# Patient Record
Sex: Male | Born: 1980 | Race: Black or African American | Hispanic: No | Marital: Married
Health system: Southern US, Academic
[De-identification: ages and names within clinical notes are randomized; demographics above are authoritative.]

## PROBLEM LIST (undated history)

## (undated) ENCOUNTER — Ambulatory Visit: Payer: PRIVATE HEALTH INSURANCE | Attending: Dermatology | Primary: Dermatology

## (undated) ENCOUNTER — Encounter: Attending: Dermatology | Primary: Dermatology

## (undated) ENCOUNTER — Encounter

## (undated) ENCOUNTER — Telehealth: Attending: Dermatology | Primary: Dermatology

## (undated) ENCOUNTER — Telehealth
Attending: Student in an Organized Health Care Education/Training Program | Primary: Student in an Organized Health Care Education/Training Program

## (undated) ENCOUNTER — Telehealth

## (undated) ENCOUNTER — Ambulatory Visit

## (undated) ENCOUNTER — Ambulatory Visit: Payer: PRIVATE HEALTH INSURANCE

## (undated) ENCOUNTER — Encounter
Attending: Student in an Organized Health Care Education/Training Program | Primary: Student in an Organized Health Care Education/Training Program

## (undated) ENCOUNTER — Telehealth: Payer: PRIVATE HEALTH INSURANCE | Attending: Dermatology | Primary: Dermatology

## (undated) ENCOUNTER — Encounter: Attending: Physician Assistant | Primary: Physician Assistant

## (undated) ENCOUNTER — Telehealth: Attending: Internal Medicine | Primary: Internal Medicine

## (undated) ENCOUNTER — Ambulatory Visit
Payer: PRIVATE HEALTH INSURANCE | Attending: Student in an Organized Health Care Education/Training Program | Primary: Student in an Organized Health Care Education/Training Program

## (undated) ENCOUNTER — Ambulatory Visit: Payer: PRIVATE HEALTH INSURANCE | Attending: Physician Assistant | Primary: Physician Assistant

## (undated) ENCOUNTER — Ambulatory Visit: Attending: Primary Care | Primary: Primary Care

## (undated) ENCOUNTER — Telehealth: Payer: PRIVATE HEALTH INSURANCE

## (undated) DIAGNOSIS — R030 Elevated blood-pressure reading, without diagnosis of hypertension: Secondary | ICD-10-CM

## (undated) DIAGNOSIS — G4733 Obstructive sleep apnea (adult) (pediatric): Secondary | ICD-10-CM

## (undated) DIAGNOSIS — F32A Depression, unspecified: Secondary | ICD-10-CM

## (undated) DIAGNOSIS — K5190 Ulcerative colitis, unspecified, without complications: Secondary | ICD-10-CM

## (undated) DIAGNOSIS — J302 Other seasonal allergic rhinitis: Secondary | ICD-10-CM

## (undated) DIAGNOSIS — E785 Hyperlipidemia, unspecified: Secondary | ICD-10-CM

## (undated) DIAGNOSIS — F329 Major depressive disorder, single episode, unspecified: Secondary | ICD-10-CM

## (undated) DIAGNOSIS — IMO0001 Reserved for inherently not codable concepts without codable children: Secondary | ICD-10-CM

## (undated) DIAGNOSIS — K921 Melena: Secondary | ICD-10-CM

## (undated) HISTORY — DX: Obstructive sleep apnea (adult) (pediatric): G47.33

## (undated) HISTORY — DX: Melena: K92.1

## (undated) HISTORY — DX: Depression, unspecified: F32.A

## (undated) HISTORY — DX: Major depressive disorder, single episode, unspecified: F32.9

## (undated) HISTORY — DX: Reserved for inherently not codable concepts without codable children: IMO0001

## (undated) HISTORY — DX: Other seasonal allergic rhinitis: J30.2

## (undated) HISTORY — PX: WRIST SURGERY: SHX841

## (undated) HISTORY — DX: Hyperlipidemia, unspecified: E78.5

## (undated) HISTORY — DX: Elevated blood-pressure reading, without diagnosis of hypertension: R03.0

## (undated) HISTORY — DX: Ulcerative colitis, unspecified, without complications: K51.90

---
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
## 2005-05-20 HISTORY — PX: SEPTOPLASTY: SUR1290

## 2012-05-20 DIAGNOSIS — G4733 Obstructive sleep apnea (adult) (pediatric): Secondary | ICD-10-CM

## 2012-05-20 HISTORY — DX: Obstructive sleep apnea (adult) (pediatric): G47.33

## 2013-11-11 MED ORDER — VARENICLINE TARTRATE 0.5 MG PO TABS
0.5 MG | ORAL_TABLET | Freq: Two times a day (BID) | ORAL | Status: DC
Start: 2013-11-11 — End: 2014-08-31

## 2013-11-11 NOTE — Telephone Encounter (Signed)
Pt wife calling in for refill on chantix.

## 2013-11-18 MED ORDER — VENLAFAXINE HCL ER 150 MG PO CP24
150 MG | ORAL_CAPSULE | Freq: Every day | ORAL | Status: DC
Start: 2013-11-18 — End: 2014-08-31

## 2014-06-17 NOTE — Telephone Encounter (Signed)
Needs appt with PCP

## 2014-06-17 NOTE — Telephone Encounter (Signed)
busy

## 2014-06-20 NOTE — Telephone Encounter (Signed)
busy

## 2014-06-21 NOTE — Telephone Encounter (Signed)
Pt wife will have pt call in.

## 2014-07-01 NOTE — Telephone Encounter (Signed)
Pt has not been seen since 09/2013. Per previous TE, pt needs OV. First available apt time 08/31/14. Pt wife requesting r/f on rx "stating he can't be with out it". Please advise.

## 2014-07-01 NOTE — Telephone Encounter (Signed)
Per [facility] ok to fill until ov. Rx called in. Wife informed

## 2014-08-31 ENCOUNTER — Ambulatory Visit: Admit: 2014-08-31 | Discharge: 2014-08-31 | Payer: BLUE CROSS/BLUE SHIELD | Attending: Family Medicine

## 2014-08-31 DIAGNOSIS — L309 Dermatitis, unspecified: Secondary | ICD-10-CM

## 2014-08-31 MED ORDER — CLOTRIMAZOLE-BETAMETHASONE 1-0.05 % EX CREA
CUTANEOUS | Status: AC
Start: 2014-08-31 — End: ?

## 2014-08-31 MED ORDER — VENLAFAXINE HCL ER 150 MG PO CP24
150 MG | ORAL_CAPSULE | Freq: Every day | ORAL | Status: AC
Start: 2014-08-31 — End: ?

## 2014-08-31 MED ORDER — CLOBETASOL PROPIONATE 0.05 % EX OINT
0.05 % | CUTANEOUS | Status: AC
Start: 2014-08-31 — End: ?

## 2014-08-31 NOTE — Progress Notes (Signed)
Subjective:       Dakota Jordan is a 34 y.o. male who presents for follow up of depression. Current symptoms include better mood. Symptoms have been stable since that time. Patient denies anhedonia, depressed mood, difficulty concentrating, fatigue, feelings of worthlessness/guilt, hopelessness, hypersomnia, impaired memory, insomnia, psychomotor agitation, psychomotor retardation, recurrent thoughts of death, suicidal attempt, suicidal thoughts with specific plan, suicidal thoughts without plan, weight gain and weight loss. Previous treatment includes: medication. He complains of the following side effects from the treatment: none.    Past Medical History   Diagnosis Date   ??? Sleep apnea    ??? Depression      Patient Active Problem List    Diagnosis Date Noted   ??? Chronic ethmoidal sinusitis 11/26/2005     History reviewed. No pertinent past surgical history.  History reviewed. No pertinent family history.  History     Social History   ??? Marital Status: Married     Spouse Name: N/A     Number of Children: N/A   ??? Years of Education: N/A     Social History Main Topics   ??? Smoking status: Current Every Day Smoker -- 0.50 packs/day for 8 years   ??? Smokeless tobacco: Never Used   ??? Alcohol Use: No   ??? Drug Use: No   ??? Sexual Activity:     Partners: Female     Other Topics Concern   ??? None     Social History Narrative   ??? None     Current Outpatient Prescriptions   Medication Sig Dispense Refill   ??? venlafaxine (EFFEXOR XR) 150 MG XR capsule Take 1 capsule by mouth daily With food 30 capsule 3     No current facility-administered medications for this visit.     Current Outpatient Prescriptions on File Prior to Visit   Medication Sig Dispense Refill   ??? venlafaxine (EFFEXOR XR) 150 MG XR capsule Take 1 capsule by mouth daily With food 30 capsule 3     No current facility-administered medications on file prior to visit.     No Known Allergies    Review of Systems  A comprehensive review of systems was negative except for:  Integument/breast: positive for rash      Objective:      BP 129/72 mmHg   Pulse 74   Ht  (1.778 m)   Wt 315 lb 6.4 oz (143.065 kg)   BMI 45.26 kg/m2   General:  alert, appears stated age and cooperative   Affect & Behavior:  full facial expressions, good grooming, good insight, normal perception, normal reasoning, normal speech pattern and content and normal thought patterns good mood     Red rash all over his legs   ring like lesion on the stomach    Assessment:      Depression, stable      Rash    Plan:      Medications: Effexor.  Labs: no labs indicated at this time.  Recommended counseling.  Handouts describing disease, natural history, and treatment were given to the patient.  Reviewed concept of anxiety as biochemical imbalance of neurotransmitters and rationale for treatment.  Instructed patient to contact office or on-call physician promptly should condition worsen or any new symptoms appear and provided on-call telephone numbers. IF THE PATIENT HAS ANY SUICIDAL OR HOMICIDAL IDEATIONS, CALL THE OFFICE, DISCUSS WITH A SUPPORT MEMBER, OR GO TO THE ER IMMEDIATELY. Patient was agreeable with this plan.  Follow  up: 4 months.  Spent 30 minutes (>50% of visit) discussing the risks of anxiety disorder, bipolar disorder, obsessive compulsive disorder, panic attacks, post traumatic stress  disorder and sleep disturbance, the  pathophysiology, etiology, risks, and principles of treatment.    Referral to dermatologist

## 2014-08-31 NOTE — Progress Notes (Signed)
Tetanus shot 2-4 years ago.

## 2014-08-31 NOTE — Patient Instructions (Signed)
Depression Treatment: Care Instructions  Your Care Instructions  Depression is a condition that affects the way you feel, think, and act. It causes symptoms such as low energy, loss of interest in daily activities, and sadness or grouchiness that goes on for a long time. Depression is very common and affects men and women of all ages.  Depression is a medical illness caused by changes in the natural chemicals in your brain. It is not a character flaw, and it does not mean that you are a bad or weak person. It does not mean that you are going crazy.  It is important to know that depression can be treated. Medicines, counseling, and self-care can all help. Many people do not get help because they are embarrassed or think that they will get over the depression on their own. But some people do not get better without treatment.  Follow-up care is a key part of your treatment and safety. Be sure to make and go to all appointments, and call your doctor if you are having problems. It's also a good idea to know your test results and keep a list of the medicines you take.  How can you care for yourself at home?  Learn about antidepressant medicines  Antidepressant medicines can improve or end the symptoms of depression. You may need to take the medicine for at least 6 months, and often longer. Keep taking your medicine even if you feel better. If you stop taking it too soon, your symptoms may come back or get worse.  You may start to feel better within 1 to 3 weeks of taking antidepressant medicine. But it can take as many as 6 to 8 weeks to see more improvement. Talk to your doctor if you have problems with your medicine or if you do not notice any improvement after 3 weeks.  Antidepressants can make you feel tired, dizzy, or nervous. Some people have dry mouth, constipation, headaches, sexual problems, an upset stomach, or diarrhea. Many of these side effects are mild and go away on their own after you take the medicine  for a few weeks. Some may last longer. Talk to your doctor if side effects bother you too much. You might be able to try a different medicine. If you are pregnant or breast-feeding, talk to your doctor about what medicines you can take.  Learn about counseling  In many cases, counseling can work as well as medicines to treat mild to moderate depression. Counseling is done by licensed mental health providers, such as psychologists, social workers, and some types of nurses. It can be done in one-on-one sessions or in a group setting. Many people find group sessions helpful.  Cognitive-behavioral therapy is a type of counseling. In this treatment therapy, you learn how to see and change unhelpful thinking styles that may be adding to your depression. Counseling and medicines often work well when used together.  To manage depression  ?? Be physically active. Getting 30 minutes of exercise each day is good for your body and your mind. Begin slowly if it is hard for you to get started. If you already exercise, keep it up.  ?? Plan something pleasant for yourself every day. Include activities that you have enjoyed in the past.  ?? Get enough sleep. Talk to your doctor if you have problems sleeping.  ?? Eat a balanced diet. If you do not feel hungry, eat small snacks rather than large meals.  ?? Do not drink alcohol, use illegal   drugs, or take medicines that your doctor has not prescribed for you. They may interfere with your treatment.  ?? Spend time with family and friends. It may help to speak openly about your depression with people you trust.  ?? Take your medicines exactly as prescribed. Call your doctor if you think you are having a problem with your medicine.  ?? Do not make major life decisions while you are depressed. Depression may change the way you think. You will be able to make better decisions after you feel better.  ?? Think positively. Challenge negative thoughts with statements such as "I am hopeful"; "Things will  get better"; and "I can ask for the help I need." Write down these statements and read them often, even if you don't believe them yet.  ?? Be patient with yourself. It took time for your depression to develop, and it will take time for your symptoms to improve. Do not take on too much or be too hard on yourself.  ?? Learn all you can about depression from written and online materials.  ?? Check out behavioral health classes to learn more about dealing with depression.  ?? Keep the numbers for these national suicide hotlines: 1-800-273-TALK (1-800-273-8255) and 1-800-SUICIDE (1-800-784-2433). If you or someone you know talks about suicide or feeling hopeless, get help right away.  When should you call for help?  Call 911 anytime you think you may need emergency care. For example, call if:  ?? You feel you cannot stop from hurting yourself or someone else.  Call your doctor now or seek immediate medical care if:  ?? You hear voices.  ?? You feel much more depressed.  Watch closely for changes in your health, and be sure to contact your doctor if:  ?? You are having problems with your depression medicine.  ?? You are not getting better as expected.   Where can you learn more?   Go to [email or web address] and sign in to your MyChart account. Enter G693 in the Search Health Information box to learn more about ???Depression Treatment: Care Instructions.???    If you do not have an account, please click on the ???Sign Up Now??? link.     ?? 2006-2015 Healthwise, Inc[employer]. Care instructions adapted under license by [facility]. This care instruction is for use with your licensed healthcare professional. If you have questions about a medical condition or this instruction, always ask your healthcare professional. Healthwise, Incorporated disclaims any warranty or liability for your use of this information.  Content Version: 10.6.465758; Current as of: April 02, 2013

## 2014-12-07 ENCOUNTER — Encounter: Attending: Family Medicine

## 2015-01-25 NOTE — Other (Unsigned)
Patient Acct Nbr: [id number]   Primary AUTH/CERT:   Primary Insurance Company Name: [employer] [facility]  Primary Insurance Plan name: [employer] Blue Cross  Primary Insurance Group Number: [id number]  Primary Insurance Plan Type: Health  Primary Insurance Policy Number: [id number]    Secondary AUTH/CERT:   Secondary Insurance Company Name: [employer]  Secondary Insurance Plan name: [facility]  Secondary Insurance Group Number:   Secondary Insurance Plan Type: Health  Secondary Insurance Policy Number: [id number]

## 2015-04-17 ENCOUNTER — Ambulatory Visit (INDEPENDENT_AMBULATORY_CARE_PROVIDER_SITE_OTHER): Payer: BLUE CROSS/BLUE SHIELD | Admitting: Primary Care

## 2015-04-17 ENCOUNTER — Telehealth: Payer: Self-pay | Admitting: Primary Care

## 2015-04-17 ENCOUNTER — Encounter: Payer: Self-pay | Admitting: Primary Care

## 2015-04-17 VITALS — BP 134/86 | HR 89 | Temp 98.1°F | Ht 71.5 in | Wt 335.8 lb

## 2015-04-17 DIAGNOSIS — F329 Major depressive disorder, single episode, unspecified: Secondary | ICD-10-CM | POA: Diagnosis not present

## 2015-04-17 DIAGNOSIS — F32A Depression, unspecified: Secondary | ICD-10-CM | POA: Insufficient documentation

## 2015-04-17 DIAGNOSIS — E785 Hyperlipidemia, unspecified: Secondary | ICD-10-CM | POA: Insufficient documentation

## 2015-04-17 MED ORDER — VENLAFAXINE HCL ER 150 MG PO CP24: 150.0000 mg | ORAL_CAPSULE | Freq: Every day | ORAL | Status: DC

## 2015-04-17 NOTE — Patient Instructions (Signed)
Please schedule a physical with me in the next 3 months. You will also schedule a lab only appointment one week prior. We will discuss your lab results during your physical.  Refills have been sent to your pharmacy for the Effexor.  It is important that you improve your diet. Please limit carbohydrates in the form of white bread, rice, pasta, cakes, cookies, junk food, etc. Increase your consumption of fresh fruits and vegetables.  You need to consume about 2 liters of water daily.  Start exercising. You should be getting 1 hour of moderate intensity exercise 5 days weekly.  It was a pleasure to meet you today! Please don't hesitate to call me with any questions. Welcome to [facility]!

## 2015-04-17 NOTE — Assessment & Plan Note (Addendum)
Endorses history of in the past. Will review records. Will do lipid panel at upcoming physical. Discussed importance of weight loss through healthy diet and regular exercise.

## 2015-04-17 NOTE — Progress Notes (Signed)
Subjective:    Patient ID: [name], male    DOB: 01/26/1981, 34 y.o.   MRN: [id number]  HPI  [name] is a 34 year old male who presents today to establish care and discuss the problems mentioned below. Will obtain old records. His last physical was over one year ago. He lives in [place] and is working in [place]. He's working to find a job in [employer].   1) Depression: Diagnosed 1-1.5 years ago. Currently managed on Effexor XR 150 mg and feels well managed at this dose. Denies SI/HI, headaches, GI upset.  2) Elevated Blood Pressure Reading: Sporadic for a few years. He's currently not managed on medication. He's cut back on fried foods, daily, and fatty foods.  3) Hyperlipidemia: History of fatty liver in the past. He's cut back on fried foods and fatty foods. He's not currently exercising. He endorses a poor diet.  His diet currently consists of: Breakfast: Cereal with almond milk Lunch: Burrito, left overs Dinner: Steak, pork chops, pizza, pastas Snacks: Junk food. Beverages: Diet soda, water (1-2 bottles daily).   Exercise: He's currently not exercising  Review of Systems  Constitutional: Negative for unexpected weight change.  HENT: Negative for rhinorrhea.   Respiratory: Negative for cough and shortness of breath.   Cardiovascular: Negative for chest pain.  Gastrointestinal: Negative for diarrhea and constipation.  Genitourinary: Negative for difficulty urinating.  Musculoskeletal:       Left wrist pain/stiffness since surgery  Skin: Negative for rash.  Allergic/Immunologic: Positive for environmental allergies.  Neurological: Negative for dizziness and headaches.       Intermittent numbness/tingling to bilateral upper extremities.   Psychiatric/Behavioral:       See HPI       Past Medical History  Diagnosis Date  . Blood in stool   . Depression   . Allergy   . Hyperlipidemia   . Hypertension     Social History   Social History  . Marital Status:  Married    Spouse Name: N/A  . Number of Children: N/A  . Years of Education: N/A   Occupational History  . Not on file.   Social History Main Topics  . Smoking status: Former Games developer  . Smokeless tobacco: Former Neurosurgeon    Quit date: 03/17/2015     Comment: Quit in [place] smoker for 9 years  . Alcohol Use: 0.0 oz/week    0 Standard drinks or equivalent per week     Comment: soical  . Drug Use: Not on file  . Sexual Activity: Not on file   Other Topics Concern  . Not on file   Social History Narrative   Married.   7 kids.   Works as a Magazine features editor at [employer].   Enjoys target shooting.     Past Surgical History  Procedure Laterality Date  . Septoplasty  2007  . Wrist surgery      Family History  Problem Relation Age of Onset  . Alcohol abuse Paternal Aunt   . Alcohol abuse Paternal Uncle   . Stroke Paternal Uncle   . Hyperlipidemia Maternal Grandfather   . Hypertension Maternal Grandfather   . Diabetes Maternal Grandfather   . Alcohol abuse Maternal Grandfather   . Multiple sclerosis Mother   . Dementia Mother     Allergies  Allergen Reactions  . Lactose Intolerance (Gi)     No current outpatient prescriptions on file prior to visit.   No current facility-administered medications on file  prior to visit.    BP 134/86 mmHg  Pulse 89  Temp(Src) 98.1 F (36.7 C) (Oral)  Ht 5' 11.5" (1.816 m)  Wt 335 lb 12.8 oz (152.318 kg)  BMI 46.19 kg/m2  SpO2 98%    Objective:   Physical Exam  Constitutional: He is oriented to person, place, and time. He appears well-nourished.  Cardiovascular: Normal rate and regular rhythm.   Pulmonary/Chest: Effort normal and breath sounds normal.  Neurological: He is alert and oriented to person, place, and time.  Skin: Skin is warm and dry.  Psychiatric: He has a normal mood and affect.          Assessment & Plan:

## 2015-04-17 NOTE — Assessment & Plan Note (Signed)
Currently managed on Effexor XR 150 mg. Feels well managed at current dose. Refills provided today. Denies SI/HI, GI upset, headaches.

## 2015-04-17 NOTE — Telephone Encounter (Signed)
Patient called back. Asked who was his PCP. Patient stated that his PCP was Dr Sander Nephewalimahl and it is on one of the papwe work.

## 2015-04-17 NOTE — Progress Notes (Signed)
Pre visit review using our clinic review tool, if applicable. No additional management support is needed unless otherwise documented below in the visit note. 

## 2015-04-17 NOTE — Telephone Encounter (Signed)
Message left for patient to return my call.  

## 2015-04-17 NOTE — Telephone Encounter (Signed)
Will you please ask Mr. [name] for the name of his PCP in [place] so I may obtain records? He listed both of his orthopedic surgeons, but not his PCP. Thanks!

## 2016-04-29 ENCOUNTER — Other Ambulatory Visit: Payer: Self-pay | Admitting: Primary Care

## 2016-04-29 DIAGNOSIS — F32A Depression, unspecified: Secondary | ICD-10-CM

## 2016-04-29 DIAGNOSIS — F329 Major depressive disorder, single episode, unspecified: Secondary | ICD-10-CM

## 2016-06-14 ENCOUNTER — Other Ambulatory Visit: Payer: Self-pay | Admitting: Primary Care

## 2016-06-14 DIAGNOSIS — F329 Major depressive disorder, single episode, unspecified: Secondary | ICD-10-CM

## 2016-06-14 DIAGNOSIS — F32A Depression, unspecified: Secondary | ICD-10-CM

## 2016-06-17 NOTE — Telephone Encounter (Signed)
Ok to refill? Electronically refill request for   venlafaxine XR (EFFEXOR-XR) 150 MG 24 hr capsule  Last prescribed and seen on 04/17/2015.

## 2016-06-18 NOTE — Telephone Encounter (Signed)
Message left for patient to return my call.  

## 2016-07-08 ENCOUNTER — Encounter: Payer: Self-pay | Admitting: Primary Care

## 2016-07-08 ENCOUNTER — Encounter (INDEPENDENT_AMBULATORY_CARE_PROVIDER_SITE_OTHER): Payer: Self-pay

## 2016-07-08 ENCOUNTER — Ambulatory Visit (INDEPENDENT_AMBULATORY_CARE_PROVIDER_SITE_OTHER): Payer: 59 | Admitting: Primary Care

## 2016-07-08 VITALS — BP 120/76 | HR 74 | Temp 98.0°F | Ht 72.0 in | Wt 340.8 lb

## 2016-07-08 DIAGNOSIS — E785 Hyperlipidemia, unspecified: Secondary | ICD-10-CM | POA: Diagnosis not present

## 2016-07-08 DIAGNOSIS — F3342 Major depressive disorder, recurrent, in full remission: Secondary | ICD-10-CM | POA: Diagnosis not present

## 2016-07-08 LAB — COMPREHENSIVE METABOLIC PANEL
ALT: 29 U/L (ref 0–53)
AST: 21 U/L (ref 0–37)
Albumin: 4.3 g/dL (ref 3.5–5.2)
Alkaline Phosphatase: 116 U/L (ref 39–117)
BUN: 10 mg/dL (ref 6–23)
CO2: 30 mEq/L (ref 19–32)
Calcium: 9.8 mg/dL (ref 8.4–10.5)
Chloride: 102 mEq/L (ref 96–112)
Creatinine, Ser: 1 mg/dL (ref 0.40–1.50)
GFR: 89.14 mL/min (ref >60.00)
Glucose, Bld: 96 mg/dL (ref 70–99)
Potassium: 4.2 mEq/L (ref 3.5–5.1)
Sodium: 136 mEq/L (ref 135–145)
Total Bilirubin: 0.3 mg/dL (ref 0.2–1.2)
Total Protein: 7.4 g/dL (ref 6.0–8.3)

## 2016-07-08 LAB — LIPID PANEL
Cholesterol: 152 mg/dL (ref 0–200)
HDL: 41.6 mg/dL (ref >39.00)
LDL Cholesterol: 76 mg/dL (ref 0–99)
NonHDL: 110.28
Total CHOL/HDL Ratio: 4
Triglycerides: 169 mg/dL — ABNORMAL HIGH (ref 0.0–149.0)
VLDL: 33.8 mg/dL (ref 0.0–40.0)

## 2016-07-08 MED ORDER — VENLAFAXINE HCL ER 150 MG PO CP24: 150.0000 mg | ORAL_CAPSULE | Freq: Every day | ORAL | 3 refills | Status: DC

## 2016-07-08 NOTE — Assessment & Plan Note (Signed)
No recent lipid panel, will check today as he is fasting. Discussed the importance of a healthy diet and regular exercise in order for weight loss, and to reduce the risk of other medical diseases.

## 2016-07-08 NOTE — Patient Instructions (Signed)
I sent refills of your Effexor to the pharmacy.   Complete lab work prior to leaving today. I will notify you of your results once received.   Continue to work on cutting back fried foods, fatty foods, dairy.  It was a pleasure to see you today!

## 2016-07-08 NOTE — Assessment & Plan Note (Signed)
Well managed on Effexor, denies SI/HI. Refills provided today.

## 2016-07-08 NOTE — Progress Notes (Signed)
Pre visit review using our clinic review tool, if applicable. No additional management support is needed unless otherwise documented below in the visit note. 

## 2016-07-08 NOTE — Progress Notes (Signed)
   Subjective:    Patient ID: [name], male    DOB: 04/10/1981, 36 y.o.   MRN: [id number]  HPI  [name] is a 36 year old male who presents today for medication refill.  1) Major Depressive Disorder: Currently managed on Effexor XR 150 mg which he's taken several years ago. He is compliant to his Effexor XR daily. He denies nausea, upset stomach, headaches, SI/HI. He had a rough year in 2017 due to several family issues. Overall the Effexor helps and is needing a refill today.  2) Hyperlipidemia: Prior diagnosis, never managed on medication. He was also diagnosed with fatty liver. He's cut back on fried foods, dairy. He has had no recent lipid check and is fasting today.  Review of Systems  Respiratory: Negative for shortness of breath.   Cardiovascular: Negative for chest pain.  Neurological: Negative for headaches.  Psychiatric/Behavioral: Negative for sleep disturbance and suicidal ideas.       Feels well managed on treatment.       Past Medical History:  Diagnosis Date  . Blood in stool   . Depression   . Elevated blood pressure   . Hyperlipidemia   . Seasonal allergies      Social History   Social History  . Marital status: Married    Spouse name: N/A  . Number of children: N/A  . Years of education: N/A   Occupational History  . Not on file.   Social History Main Topics  . Smoking status: Former Games developermoker  . Smokeless tobacco: Former NeurosurgeonUser    Quit date: 03/17/2015     Comment[place] 9 years  . Alcohol use 0.0 oz/week     Comment: soical  . Drug use: Unknown  . Sexual activity: Not on file   Other Topics Concern  . Not on file   Social History Narrative   Married.   7 kids.   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   Enjoys target shooting.     Past Surgical History:  Procedure Laterality Date  . SEPTOPLASTY  2007  . WRIST SURGERY      Family History  Problem Relation Age of Onset  . Alcohol abuse Paternal Aunt   .  Alcohol abuse Paternal Uncle   . Stroke Paternal Uncle   . Hyperlipidemia Maternal Grandfather   . Hypertension Maternal Grandfather   . Diabetes Maternal Grandfather   . Alcohol abuse Maternal Grandfather   . Multiple sclerosis Mother   . Dementia Mother     Allergies  Allergen Reactions  . Lactose Intolerance (Gi)     Current Outpatient Prescriptions on File Prior to Visit  Medication Sig Dispense Refill  . ranitidine (ZANTAC) 150 MG tablet Take 150 mg by mouth 2 (two) times daily.     No current facility-administered medications on file prior to visit.     BP 120/76   Pulse 74   Temp 98 F (36.7 C) (Oral)   Ht 6' (1.829 m)   Wt (!) 340 lb 12.8 oz (154.6 kg)   SpO2 99%   BMI 46.22 kg/m    Objective:   Physical Exam  Constitutional: He appears well-nourished.  Cardiovascular: Normal rate and regular rhythm.   Pulmonary/Chest: Effort normal and breath sounds normal.  Skin: Skin is warm and dry.  Psychiatric: He has a normal mood and affect.          Assessment & Plan:

## 2016-07-10 ENCOUNTER — Encounter: Payer: Self-pay | Admitting: *Deleted

## 2016-08-21 ENCOUNTER — Other Ambulatory Visit: Payer: Self-pay | Admitting: Primary Care

## 2016-08-21 DIAGNOSIS — F3342 Major depressive disorder, recurrent, in full remission: Secondary | ICD-10-CM

## 2016-08-21 MED ORDER — VENLAFAXINE HCL ER 150 MG PO CP24: 150.0000 mg | ORAL_CAPSULE | Freq: Every day | ORAL | 3 refills | Status: DC

## 2016-08-21 NOTE — Telephone Encounter (Signed)
Received faxed refill request for venlafaxine XR (EFFEXOR-XR) 150 MG 24 hr capsule. Last prescribed on 07/08/2016.  Patient has change pharmacy to [facility] mail order.  Will send as requested.

## 2016-11-20 ENCOUNTER — Emergency Department (HOSPITAL_COMMUNITY)
Admission: EM | Admit: 2016-11-20 | Discharge: 2016-11-20 | Disposition: A | Discharge status: Home / Self Care | Payer: 59 | Attending: Emergency Medicine | Admitting: Emergency Medicine

## 2016-11-20 ENCOUNTER — Emergency Department (HOSPITAL_COMMUNITY): Payer: 59

## 2016-11-20 ENCOUNTER — Encounter (HOSPITAL_COMMUNITY): Payer: Self-pay | Admitting: Vascular Surgery

## 2016-11-20 DIAGNOSIS — Y939 Activity, unspecified: Secondary | ICD-10-CM | POA: Insufficient documentation

## 2016-11-20 DIAGNOSIS — Y33XXXA Other specified events, undetermined intent, initial encounter: Secondary | ICD-10-CM | POA: Diagnosis not present

## 2016-11-20 DIAGNOSIS — Y998 Other external cause status: Secondary | ICD-10-CM | POA: Diagnosis not present

## 2016-11-20 DIAGNOSIS — I10 Essential (primary) hypertension: Secondary | ICD-10-CM | POA: Insufficient documentation

## 2016-11-20 DIAGNOSIS — S81802A Unspecified open wound, left lower leg, initial encounter: Secondary | ICD-10-CM | POA: Insufficient documentation

## 2016-11-20 DIAGNOSIS — Z79899 Other long term (current) drug therapy: Secondary | ICD-10-CM | POA: Diagnosis not present

## 2016-11-20 DIAGNOSIS — Y929 Unspecified place or not applicable: Secondary | ICD-10-CM | POA: Diagnosis not present

## 2016-11-20 DIAGNOSIS — Z87891 Personal history of nicotine dependence: Secondary | ICD-10-CM | POA: Insufficient documentation

## 2016-11-20 DIAGNOSIS — M799 Soft tissue disorder, unspecified: Secondary | ICD-10-CM | POA: Diagnosis not present

## 2016-11-20 DIAGNOSIS — E785 Hyperlipidemia, unspecified: Secondary | ICD-10-CM | POA: Diagnosis not present

## 2016-11-20 LAB — BASIC METABOLIC PANEL
Anion gap: 7 (ref 5–15)
BUN: 13 mg/dL (ref 6–20)
CO2: 26 mmol/L (ref 22–32)
Calcium: 8.9 mg/dL (ref 8.9–10.3)
Chloride: 102 mmol/L (ref 101–111)
Creatinine, Ser: 0.99 mg/dL (ref 0.61–1.24)
GFR calc Af Amer: >60 mL/min (ref >60)
GFR calc non Af Amer: >60 mL/min (ref >60)
Glucose, Bld: 100 mg/dL — ABNORMAL HIGH (ref 65–99)
Potassium: 4.1 mmol/L (ref 3.5–5.1)
Sodium: 135 mmol/L (ref 135–145)

## 2016-11-20 LAB — CBC WITH DIFFERENTIAL/PLATELET
Basophils Absolute: 0 10*3/uL (ref 0.0–0.1)
Basophils Relative: 0 %
Eosinophils Absolute: 0.4 10*3/uL (ref 0.0–0.7)
Eosinophils Relative: 4 %
HCT: 43.3 % (ref 39.0–52.0)
Hemoglobin: 14.2 g/dL (ref 13.0–17.0)
Lymphocytes Relative: 27 %
Lymphs Abs: 2.3 10*3/uL (ref 0.7–4.0)
MCH: 29.3 pg (ref 26.0–34.0)
MCHC: 32.8 g/dL (ref 30.0–36.0)
MCV: 89.5 fL (ref 78.0–100.0)
Monocytes Absolute: 0.6 10*3/uL (ref 0.1–1.0)
Monocytes Relative: 7 %
Neutro Abs: 5.4 10*3/uL (ref 1.7–7.7)
Neutrophils Relative %: 62 %
Platelets: 233 10*3/uL (ref 150–400)
RBC: 4.84 MIL/uL (ref 4.22–5.81)
RDW: 12.9 % (ref 11.5–15.5)
WBC: 8.6 10*3/uL (ref 4.0–10.5)

## 2016-11-20 MED ORDER — CLINDAMYCIN PHOSPHATE 600 MG/50ML IV SOLN
600.0000 mg | Freq: Once | INTRAVENOUS | Status: AC
  Administered 2016-11-20: 600 mg via INTRAVENOUS
  Filled 2016-11-20: qty 50

## 2016-11-20 MED ORDER — CLINDAMYCIN HCL 150 MG PO CAPS: 300.0000 mg | ORAL_CAPSULE | Freq: Three times a day (TID) | ORAL | 0 refills | Status: DC

## 2016-11-20 NOTE — Discharge Instructions (Signed)
Take the prescribed medication as directed.  Keep wound clean with soap and warm water.  Keep wound covered if out and about, use the supplies we have given you. Follow-up with your primary care doctor for re-check. Return to the ED for new or worsening symptoms-- worsening redness, swelling, high fever, etc.

## 2016-11-20 NOTE — ED Notes (Signed)
ED Provider at bedside. 

## 2016-11-20 NOTE — ED Notes (Signed)
Patient transported to X-ray 

## 2016-11-20 NOTE — ED Triage Notes (Signed)
Pt denies any hx of DM.

## 2016-11-20 NOTE — ED Triage Notes (Signed)
Pt reports to the ED for eval of wound infection. 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 Dressing present over wound. He has been trying abx cream x 10-12 days which helped the erythema some but then it seemed to stop working and the erythema began to get worse.

## 2016-11-20 NOTE — ED Provider Notes (Signed)
Medical screening examination/treatment/procedure(s) were conducted as a shared visit with non-physician practitioner(s) and myself.  I personally evaluated the patient during the encounter. Briefly, the patient is a 36 yo obese male here with LLE ulceration with surrounding cellulitis following minor local trauma from scratching. Concern for acute cellulitis, with secondary ulceration likely 2/2 skin edema. Pt obese but denies h/o DM or poor wound healing. Exam is not c/w pyoderma though he does have h/o recurrent wounds particularly to LLE, so PCP attention to this would be recommended. Lab work shows no leukocytosis, no hyponatremia. He has no fever, is well-appearing, with no crepitance on exam, no free air on plain film, no hyponatremia - doubt nec fasc or myositis.  Considering he has no systemic sx of infection with no signs of sepsis, with no prior trial of PO abx, will trial PO abx, advise local wound care to ulcer, and PCP f/u in 2 days.   EKG Interpretation None          Duffy Bruce, MD 11/20/16 1921

## 2016-11-20 NOTE — ED Provider Notes (Signed)
[facility] DEPT Provider Note   CSN: [id number] Arrival date & time: 11/20/16  1523     History   Chief Complaint Chief Complaint  Patient presents with  . Wound Infection    HPI [name] is a 36 y.o. male.  The history is provided by the patient and medical records.    36 y.o. M with hx of depression, HTN, HLP, seasonal allergies, Presenting to the ED with wound of left leg. Reports he has a lactose intolerance and usually breaks out in a rash and he drinks milk or has certain dairy products. States about a month ago he developed a rash of his leg. They did initially got better with some antibiotic cream, but then started getting worse. He tried to switch to topical Neosporin to see if this would help. He admits he has been scratching the leg and his form somewhat of a "crater" in his left lower leg. He went to urgent care earlier today and was seen here for further evaluation.  He denies any fever or chills. No numbness or weakness of his left leg. He has no history of diabetes, HIV, or MRSA. No significant history of nonhealing wounds.  No drug use.  Past Medical History:  Diagnosis Date  . Blood in stool   . Depression   . Elevated blood pressure   . Hyperlipidemia   . Seasonal allergies     Patient Active Problem List   Diagnosis Date Noted  . Depression 04/17/2015  . Hyperlipidemia 04/17/2015    Past Surgical History:  Procedure Laterality Date  . SEPTOPLASTY  2007  . WRIST SURGERY         Home Medications    Prior to Admission medications   Medication Sig Start Date End Date Taking? Authorizing Provider  ranitidine (ZANTAC) 150 MG tablet Take 150 mg by mouth 2 (two) times daily.    [provider]  venlafaxine XR (EFFEXOR-XR) 150 MG 24 hr capsule Take 1 capsule (150 mg total) by mouth daily with breakfast. 08/21/16   [name], NP    Family History Family History  Problem Relation Age of Onset  . Alcohol abuse Paternal Aunt   .  Alcohol abuse Paternal Uncle   . Stroke Paternal Uncle   . Hyperlipidemia Maternal Grandfather   . Hypertension Maternal Grandfather   . Diabetes Maternal Grandfather   . Alcohol abuse Maternal Grandfather   . Multiple sclerosis Mother   . Dementia Mother     Social History Social History  Substance Use Topics  . Smoking status: Former Games developer  . Smokeless tobacco: Former Neurosurgeon    Quit date: 03/17/2015     Comment: Quit in [place] smoker for 9 years  . Alcohol use 0.0 oz/week     Comment: soical     Allergies   Lactose intolerance (gi)   Review of Systems Review of Systems  Skin: Positive for wound.  All other systems reviewed and are negative.    Physical Exam Updated Vital Signs BP (!) 148/81 (BP Location: Right Arm)   Pulse 86   Temp 97.9 F (36.6 C) (Oral)   Resp 16   Ht 5\' 11"  (1.803 m)   Wt (!) 152.9 kg (337 lb)   SpO2 98%   BMI 47.00 kg/m   Physical Exam  Constitutional: He is oriented to person, place, and time. He appears well-developed and well-nourished.  HENT:  Head: Normocephalic and atraumatic.  Mouth/Throat: Oropharynx is clear and moist.  Eyes: Conjunctivae  and EOM are normal. Pupils are equal, round, and reactive to light.  Neck: Normal range of motion.  Cardiovascular: Normal rate, regular rhythm and normal heart sounds.   Pulmonary/Chest: Effort normal and breath sounds normal. No respiratory distress. He has no wheezes.  Abdominal: Soft. Bowel sounds are normal. There is no tenderness. There is no rebound.  Musculoskeletal: Normal range of motion.  Wound of left lateral calf with a crater-like wound, approx 2cm in diameter; there is purulent drainage noted within wound; surrounding erythema, induration, and warmth to touch surrounding wound about 3 cm in all directions; no tissue crepitus; no fluctuance noted; DP pulse intact  Neurological: He is alert and oriented to person, place, and time.  Skin: Skin is warm and dry.    Psychiatric: He has a normal mood and affect.  Nursing note and vitals reviewed.       ED Treatments / Results  Labs (all labs ordered are listed, but only abnormal results are displayed) Labs Reviewed  BASIC METABOLIC PANEL - Abnormal; Notable for the following:       Result Value   Glucose, Bld 100 (*)    All other components within normal limits  AEROBIC CULTURE (SUPERFICIAL SPECIMEN)  CULTURE, BLOOD (ROUTINE X 2)  CULTURE, BLOOD (ROUTINE X 2)  CBC WITH DIFFERENTIAL/PLATELET    EKG  EKG Interpretation None       Radiology Dg Tibia/fibula Left  Result Date: 11/20/2016 CLINICAL DATA:  Six week history of soft tissue wound. EXAM: LEFT TIBIA AND FIBULA - 2 VIEW COMPARISON:  None. FINDINGS: No fracture. No worrisome lytic or sclerotic osseous abnormality. Soft tissue ulcer noted lateral aspect of the distal leg. IMPRESSION: No acute bony findings. Electronically Signed   By: Kennith Center M.D.   On: 11/20/2016 16:45    Procedures Procedures (including critical care time)  Medications Ordered in ED Medications - No data to display   Initial Impression / Assessment and Plan / ED Course  I have reviewed the triage vital signs and the nursing notes.  Pertinent labs & imaging results that were available during my care of the patient were reviewed by me and considered in my medical decision making (see chart for details).  36 year old male here with wound to left lower leg. Reports this began about a month ago when he developed a rash to milk allergy which is common for him. States he has been scratching the area somewhat. Seen at urgent care earlier today and sent here for further evaluation. He is afebrile and nontoxic. He does have a 2 cm crater-like wound to the left lateral calf with about 3 cm of surrounding erythema on all sides. There is some induration consistent with cellulitis. I do not appreciate any tissue crepitus. Some purulent drainage within wound but no  signs of abscess formation. Plain film negative for any gas or acute bony involvement. Labwork is reassuring, normal white blood cell count. Blood and wound cultures have been sent. Patient was given dose of IV clindamycin here. Given his stable vitals, normal labs, and overall well appearance, feel he is stable for trial of outpatient therapy as he has not yet completed course of oral antibiotics (only topical).  Patient agreeable to this. Wound was cleansed and dressed here with Xeroform wet-to-dry dressing. Discussed home wound care. Will need close follow-up with PCP for re-check.  Discussed plan with patient, he acknowledged understanding and agreed with plan of care.  Return precautions given for new or worsening symptoms.  Patient  seen and evaluated with attending physician, Dr. Erma HeritageIsaacs, who agrees with assessment and plan of care.  Final Clinical Impressions(s) / ED Diagnoses   Final diagnoses:  Wound of left leg, initial encounter    New Prescriptions Discharge Medication List as of 11/20/2016  7:51 PM    START taking these medications   Details  clindamycin (CLEOCIN) 150 MG capsule Take 2 capsules (300 mg total) by mouth 3 (three) times daily. May dispense as 150mg  capsules, Starting Wed 11/20/2016, Print         Garlon HatchetSanders, Catrina Fellenz M, PA-C 11/20/16 2025    Shaune PollackIsaacs, Cameron, MD 11/22/16 0730

## 2016-11-23 LAB — AEROBIC CULTURE W GRAM STAIN (SUPERFICIAL SPECIMEN)

## 2016-11-24 ENCOUNTER — Telehealth: Payer: Self-pay

## 2016-11-24 NOTE — Telephone Encounter (Signed)
Post ED Visit - Positive Culture Follow-up: Unsuccessful Patient Follow-up  Culture assessed and recommendations reviewed by:  []  Enzo BiNathan Batchelder, Pharm.D. []  Celedonio MiyamotoJeremy Frens, [address] BoulevardPharm.D., BCPS AQ-ID []  Garvin FilaMike Maccia, Pharm.D., BCPS []  Georgina PillionElizabeth Martin, Pharm.D., BCPS []  [place]vers[place]Minh Pham, [address] BoulevardPharm.D., BCPS, AAHIVP []  Estella HuskMichelle Turner, Pharm.D., BCPS, AAHIVP []  [name] Pearlachel Rumbarger, PharmD, BCPS []  Casilda Carlsaylor Stone, PharmD, BCPS []  Pollyann SamplesAndy Johnston, PharmD, BCPS [place] HunAllison Masters Pharm D Positive Aerobic culture  []  Patient discharged without antimicrobial prescription and treatment is now indicated [x]  Organism is resistant to prescribed ED discharge antimicrobial []  Patient with positive blood cultures   Unable to contact patient after 3 attempts, letter will be sent to address on file  Jerry CarasCullom, [name] 11/24/2016, 1:00 PM

## 2016-11-25 ENCOUNTER — Encounter: Payer: Self-pay | Admitting: Primary Care

## 2016-11-25 ENCOUNTER — Ambulatory Visit (INDEPENDENT_AMBULATORY_CARE_PROVIDER_SITE_OTHER): Payer: 59 | Admitting: Primary Care

## 2016-11-25 VITALS — BP 118/72 | HR 87 | Temp 98.7°F | Ht 72.0 in | Wt 339.8 lb

## 2016-11-25 DIAGNOSIS — L97222 Non-pressure chronic ulcer of left calf with fat layer exposed: Secondary | ICD-10-CM

## 2016-11-25 DIAGNOSIS — L88 Pyoderma gangrenosum: Secondary | ICD-10-CM | POA: Insufficient documentation

## 2016-11-25 DIAGNOSIS — J302 Other seasonal allergic rhinitis: Secondary | ICD-10-CM | POA: Insufficient documentation

## 2016-11-25 LAB — CULTURE, BLOOD (ROUTINE X 2)
Culture: NO GROWTH
Culture: NO GROWTH
Special Requests: ADEQUATE

## 2016-11-25 MED ORDER — CETIRIZINE HCL 10 MG PO TABS: 10.0000 mg | ORAL_TABLET | Freq: Every day | ORAL | 1 refills | Status: DC

## 2016-11-25 NOTE — Progress Notes (Signed)
Subjective:    Patient ID: [name], male    DOB: 06/06/1980, 36 y.o.   MRN: [id number]  HPI  [name] is a 36 year old male who presents today for emergency department follow up.  [facility] with a chief complaint of wound infection of his left lower extremity. He has a history of lactose intolerance and will experience breakouts with dairy consumption. The rash began 1 month prior, improved with antibiotic cream, then began getting worse. Prior to his visit to the ED he was evaluated at Urgent Care.  During his stay in the ED he was noted to have moderate erythema with a 2 cm crater-like wound to the left lateral calf. Xrays were negative for bony involvement. His wound was cleansed and dressed with Xeroform wet-to-dry dressing. He was prescribed clindamycin 300 mg TID and discharged home later that day. It was recommended he follow up with the wound center, he declined.  Since his emergency department visit he's noticed yellow drainage from the wound. He thinks the erythema has improved. His wound does hurt at times. He denies fevers. He's been compliant to his antibiotics and dressing changes. He's not scheduled a visit with the wound center as he'd like to use this as a "last resort".   Review of Systems  Constitutional: Negative for fever.  HENT: Positive for rhinorrhea.   Cardiovascular: Negative for palpitations.  Allergic/Immunologic: Positive for environmental allergies.  Neurological: Negative for weakness.       Past Medical History:  Diagnosis Date  . Blood in stool   . Depression   . Elevated blood pressure   . Hyperlipidemia   . Seasonal allergies      Social History   Social History  . Marital status: Married    Spouse name: N/A  . Number of children: N/A  . Years of education: N/A   Occupational History  . Not on file.   Social History Main Topics  . Smoking status: Former Games developermoker  . Smokeless tobacco: Former NeurosurgeonUser    Quit date:  03/17/2015     Comment: [place] 9 years  . Alcohol use 0.0 oz/week     Comment: soical  . Drug use: Unknown  . Sexual activity: Not on file   Other Topics Concern  . Not on file   Social History Narrative   Married.   7 kids.   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   Enjoys target shooting.     Past Surgical History:  Procedure Laterality Date  . SEPTOPLASTY  2007  . WRIST SURGERY      Family History  Problem Relation Age of Onset  . Alcohol abuse Paternal Aunt   . Alcohol abuse Paternal Uncle   . Stroke Paternal Uncle   . Hyperlipidemia Maternal Grandfather   . Hypertension Maternal Grandfather   . Diabetes Maternal Grandfather   . Alcohol abuse Maternal Grandfather   . Multiple sclerosis Mother   . Dementia Mother     Allergies  Allergen Reactions  . Biaxin [Clarithromycin] Other (See Comments)    Causes colitis flares  . Milk-Related Compounds Hives    Current Outpatient Prescriptions on File Prior to Visit  Medication Sig Dispense Refill  . acetaminophen (TYLENOL) 500 MG tablet Take 1,000 mg by mouth every 6 (six) hours as needed for headache (pain).    . clindamycin (CLEOCIN) 150 MG capsule Take 2 capsules (300 mg total) by mouth 3 (three)  times daily. May dispense as 150mg  capsules 60 capsule 0  . ibuprofen (ADVIL,MOTRIN) 200 MG tablet Take 400-600 mg by mouth every 6 (six) hours as needed for headache (pain).    . ranitidine (ZANTAC) 150 MG tablet Take 150 mg by mouth 2 (two) times daily as needed for heartburn (acid reflux).     . venlafaxine XR (EFFEXOR-XR) 150 MG 24 hr capsule Take 1 capsule (150 mg total) by mouth daily with breakfast. 90 capsule 3   No current facility-administered medications on file prior to visit.     BP 118/72   Pulse 87   Temp 98.7 F (37.1 C) (Oral)   Ht 6' (1.829 m)   Wt (!) 339 lb 12.8 oz (154.1 kg)   SpO2 96%   BMI 46.09 kg/m    Objective:   Physical Exam  Constitutional: He appears  well-nourished.  Neck: Neck supple.  Cardiovascular: Normal rate.   Pulmonary/Chest: Effort normal.  Skin: Skin is warm and dry. There is erythema.  1 cm x 0.75 cm crater with yellow/clear drainage. Erythema measuring 3 cm x 3 cm.           Assessment & Plan:

## 2016-11-25 NOTE — Patient Instructions (Signed)
Stop by the front desk and speak with either Shirlee LimerickMarion or Zella BallRobin regarding your referral to the wound clinic.  Please follow up with me in 1 week if you cannot get in with the wound center by then.  Continue Clindamycin antibiotics as prescribed.  It was a pleasure to see you today!

## 2016-11-25 NOTE — Assessment & Plan Note (Addendum)
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 Based off of pictures from his ED visit, overall the wound does appear to be slightly improved. Wound cultures with sensitivity to current antibiotics.   Will have him continue antibiotics. Referral placed to wound center for further evaluation. Follow up in our clinic or with wound clinic in 1 week, whichever is possible.  CMP in February 2018 without evidence of hyperglycemia.

## 2016-11-25 NOTE — Assessment & Plan Note (Signed)
Intermittent, worse recently. Rx for Zyrtec sent to pharmacy.

## 2016-12-04 ENCOUNTER — Encounter: Payer: 59 | Attending: Internal Medicine | Admitting: Internal Medicine

## 2016-12-04 DIAGNOSIS — I872 Venous insufficiency (chronic) (peripheral): Secondary | ICD-10-CM | POA: Diagnosis not present

## 2016-12-04 DIAGNOSIS — L97222 Non-pressure chronic ulcer of left calf with fat layer exposed: Secondary | ICD-10-CM | POA: Diagnosis not present

## 2016-12-04 DIAGNOSIS — I10 Essential (primary) hypertension: Secondary | ICD-10-CM | POA: Insufficient documentation

## 2016-12-04 DIAGNOSIS — L88 Pyoderma gangrenosum: Secondary | ICD-10-CM | POA: Diagnosis not present

## 2016-12-04 DIAGNOSIS — E872 Acidosis: Secondary | ICD-10-CM | POA: Diagnosis not present

## 2016-12-04 DIAGNOSIS — L97223 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sis of muscle: Secondary | ICD-10-CM | POA: Diagnosis not present

## 2016-12-04 DIAGNOSIS — F17210 Nicotine dependence, cigarettes, uncomplicated: Secondary | ICD-10-CM | POA: Insufficient documentation

## 2016-12-04 DIAGNOSIS — G4730 Sleep apnea, unspecified: Secondary | ICD-10-CM | POA: Insufficient documentation

## 2016-12-04 DIAGNOSIS — L988 Other specified disorders of the skin and subcutaneous tissue: Secondary | ICD-10-CM | POA: Diagnosis not present

## 2016-12-05 NOTE — Progress Notes (Signed)
[name], [name] ([id number]) Visit Report for 12/04/2016 Abuse/Suicide Risk Screen Details Patient Name: [name], [name] Date of Service: 12/04/2016 12:45 PM Medical Record Number: [id number] Patient Account Number: [id number] Date of Birth/Sex: [birth year] (35 y.o. Male) Treating RN: Baruch Gouty, RN, [name], [facility]va [facility]man Primary Care Vernica Wachtel: [name] Other Clinician: Referring Donielle Kaigler: [name] Treating Lular Letson/Extender: Ricard Dillon Weeks in Treatment: 0 Abuse/Suicide Risk Screen Items Answer ABUSE/SUICIDE RISK SCREEN: Has anyone close to you tried to hurt or harm you recentlyo No Do you feel uncomfortable with anyone in your familyo No Has anyone forced you do things that you didnot want to doo No Do you have any thoughts of harming yourselfo No Patient displays signs or symptoms of abuse and/or neglect. No Electronic Signature(s) Signed: 12/04/2016 6:10:04 PM By: Regan Lemming [name], RN Entered By: Regan Lemming on 12/04/2016 12:54:36 [name] ([id number]) -------------------------------------------------------------------------------- Activities of Daily Living Details Patient Name: [name], [name] Date of Service: 12/04/2016 12:45 PM Medical Record Number: [id number] Patient Account Number: [id number] Date of Birth/Sex: [birth year] (35 y.o. Male) Treating RN: Baruch Gouty, RN, [name], Velva Harman Primary Care Grayer Sproles: [name] Other Clinician: Referring Catalea Labrecque: [name] Treating Kyani Simkin/Extender: Ricard Dillon Weeks in Treatment: 0 Activities of Daily Living Items Answer Activities of Daily Living (Please select one for each item) Drive Automobile Completely Able Take Medications Completely Able Use Telephone Completely Able Care for Appearance Completely Able Use Toilet Completely Able Bath / Shower Completely Able Dress Self Completely Able Feed Self Completely Able Walk Completely Able Get In / Out Bed Completely Able Housework Completely Able Prepare Meals  Completely [place] for Self Completely Able Electronic Signature(s) Signed: 12/04/2016 6:10:04 PM By: Regan Lemming [name], RN Entered By: Regan Lemming on 12/04/2016 12:55:46 [name] ([id number]) -------------------------------------------------------------------------------- Education Assessment Details Patient Name: [name] Date of Service: 12/04/2016 12:45 PM Medical Record Number: [id number] Patient Account Number: [id number] Date of Birth/Sex: [birth year] (35 y.o. Male) Treating RN: Baruch Gouty, RN, [name], [place] Primary Care Daly Whipkey: [name] Other Clinician: Referring Arrabella Westerman: [name] Treating Naudia Crosley/Extender: Tito Dine in Treatment: 0 Primary Learner Assessed: Patient Learning Preferences/Education Level/Primary Language Highest Education Level: High School Preferred Language: English Cognitive Barrier Assessment/Beliefs Language Barrier: No Physical Barrier Assessment Impaired Vision: Yes Glasses Impaired Hearing: No Decreased Hand dexterity: No Knowledge/Comprehension Assessment Knowledge Level: High Comprehension Level: High Ability to understand written High instructions: Ability to understand verbal High instructions: Motivation Assessment Anxiety Level: Calm Cooperation: Cooperative Education Importance: Acknowledges Need Interest in Health Problems: Asks Questions Perception: Coherent Willingness to Engage in Self- High Management Activities: Readiness to Engage in Self- High Management Activities: Electronic Signature(s) Signed: 12/04/2016 6:10:04 PM By: Regan Lemming [name], RN Entered By: Regan Lemming on 12/04/2016 12:55:25 [name] ([id number]) -------------------------------------------------------------------------------- Fall Risk Assessment Details Patient Name: [name] Date of Service: 12/04/2016 12:45 PM Medical Record Number: [id number] Patient Account Number: [id number] Date  of Birth/Sex: Jan 04, 1981 (35 y.o. Male) Treating RN: Baruch Gouty, RN, [name], [place] Primary Care Trevaris Pennella: [name] Other Clinician: Referring Jamarie Mussa: [name] Treating Ryana Montecalvo/Extender: Tito Dine in Treatment: 0 Fall Risk Assessment Items Have you had 2 or more falls in the last 12 monthso 0 No Have you had any fall that resulted in injury in the last 12 monthso 0 No FALL RISK ASSESSMENT: History of falling - immediate or within 3 months 0 No Secondary diagnosis 0 No Ambulatory aid None/bed rest/wheelchair/nurse 0 Yes Crutches/cane/walker 0 No Furniture 0 No IV Access/Saline Lock 0 No Gait/Training Normal/bed rest/immobile 0 Yes Weak  0 No Impaired 0 No Mental Status Oriented to own ability 0 Yes Electronic Signature(s) Signed: 12/04/2016 6:10:04 PM By: Regan Lemming [name], RN Entered By: Regan Lemming on 12/04/2016 12:55:04 [name] ([id number]) -------------------------------------------------------------------------------- Foot Assessment Details Patient Name: [name] Date of Service: 12/04/2016 12:45 PM Medical Record Number: [id number] Patient Account Number: [id number] Date of Birth/Sex: September 03, 1980 (35 y.o. Male) Treating RN: Baruch Gouty, RN, [name], [place] Primary Care Evaristo Tsuda: [name] Other Clinician: Referring Caprice Mccaffrey: [name] Treating Eleonora Peeler/Extender: Ricard Dillon Weeks in Treatment: 0 Foot Assessment Items Site Locations + = Sensation present, - = Sensation absent, C = Callus, U = Ulcer R = Redness, W = Warmth, M = Maceration, PU = Pre-ulcerative lesion F = Fissure, S = Swelling, D = Dryness Assessment Right: Left: Other Deformity: No No Prior Foot Ulcer: No No Prior Amputation: No No Charcot Joint: No No Ambulatory Status: Ambulatory Without Help Gait: Steady Electronic Signature(s) Signed: 12/04/2016 6:10:04 PM By: Regan Lemming [name], RN Entered By: Regan Lemming on 12/04/2016 12:54:54 [name]  ([id number]) -------------------------------------------------------------------------------- Nutrition Risk Assessment Details Patient Name: [name] Date of Service: 12/04/2016 12:45 PM Medical Record Number: [id number] Patient Account Number: [id number] Date of Birth/Sex: [birth year] (35 y.o. Male) Treating RN: Baruch Gouty, RN, [name], [place] Primary Care Jasmon Graffam: [name] Other Clinician: Referring Alani Lacivita: [name] Treating Myriah Boggus/Extender: Ricard Dillon Weeks in Treatment: 0 Height (in): Weight (lbs): Body Mass Index (BMI): Nutrition Risk Assessment Items NUTRITION RISK SCREEN: I have an illness or condition that made me change the kind and/or 0 No amount of food I eat I eat fewer than two meals per day 0 No I eat few fruits and vegetables, or milk products 0 No I have three or more drinks of beer, liquor or wine almost every day 0 No I have tooth or mouth problems that make it hard for me to eat 0 No I don't always have enough money to buy the food I need 0 No I eat alone most of the time 0 No I take three or more different prescribed or over-the-counter drugs a 0 No day Without wanting to, I have lost or gained 10 pounds in the last six 0 No months I am not always physically able to shop, cook and/or feed myself 0 No Nutrition Protocols Good Risk Protocol 0 No interventions needed Moderate Risk Protocol Electronic Signature(s) Signed: 12/04/2016 6:10:04 PM By: Regan Lemming [name], RN Entered By: Regan Lemming on 12/04/2016 12:54:43

## 2016-12-05 NOTE — Progress Notes (Signed)
GERROD, MAULE ([id number]) Visit Report for 12/04/2016 Biopsy Details Patient Name: [name], [name] Date of Service: 12/04/2016 12:45 PM Medical Record Number: [id number] Patient Account Number: [id number] Date of Birth/Sex: 1980-06-27 (35 y.o. Male) Treating RN: Clover Mealy, RN, [name], [place] Primary Care Provider: [name] Other Clinician: Referring Provider: [name] Treating Provider/Extender: Maxwell Caul Weeks in Treatment: 0 Biopsy Performed for: Wound #1 Left, Lateral Lower Leg Location(s): Wound Margin, Peri-Ulcer Tissue Performed By: Physician Maxwell Caul, MD Tissue Punch: Yes Size (mm): 5 Number of Specimens Taken: 1 Specimen Sent To Pathology: Yes Pre-procedure Verification/Time-Out Yes - 13:30 Taken: Pain Control: Lidocaine Injectable Lidocaine Percent: 2% Instrument: Forceps, Other: punch Bleeding: Moderate Hemostasis Achieved: Silver Nitrate Procedural Pain: 0 Post Procedural Pain: 0 Response to Treatment: Procedure was tolerated well Post Procedure Diagnosis Same as Pre-procedure Electronic Signature(s) Signed: 12/04/2016 4:47:30 PM By: Baltazar Najjar MD Entered By: Baltazar Najjar on 12/04/2016 13:54:56 [place], [name] ([id number]) -------------------------------------------------------------------------------- Chief Complaint Document Details Patient Name: [name] Date of Service: 12/04/2016 12:45 PM Medical Record Number: [id number] Patient Account Number: [id number] Date of Birth/Sex: 07-28-1980 (35 y.o. Male) Treating RN: Clover Mealy, RN, [name], Rita Primary Care Provider: [name] Other Clinician: Referring Provider: [name] Treating Provider/Extender: Maxwell Caul Weeks in Treatment: 0 Information Obtained from: Patient Chief Complaint 12/04/16; patient is here for review of a wound on his posterior left calf that has been present for about a year Electronic Signature(s) Signed: 12/04/2016 4:47:30 PM By: Baltazar Najjar  MD Entered By: Baltazar Najjar on 12/04/2016 13:55:31 [name] ([id number]) -------------------------------------------------------------------------------- Debridement Details Patient Name: [name] Date of Service: 12/04/2016 12:45 PM Medical Record Number: [id number] Patient Account Number: [id number] Date of Birth/Sex: 07-09-1980 (35 y.o. Male) Treating RN: Clover Mealy, RN, [name], Rita Primary Care Provider: [name] Other Clinician: Referring Provider: [name] Treating Provider/Extender: Maxwell Caul Weeks in Treatment: 0 Debridement Performed for Wound #1 Left,Lateral Lower Leg Assessment: Performed By: Physician Maxwell Caul, MD Debridement: Debridement Severity of Tissue Pre Fat layer exposed Debridement: Pre-procedure Verification/Time Out Yes - 13:34 Taken: Start Time: 13:34 Pain Control: Lidocaine 4% Topical Solution Level: Skin/Subcutaneous Tissue Total Area Debrided (L x 2.5 (cm) x 2.5 (cm) = 6.25 (cm) W): Tissue and other Non-Viable, Exudate, Fat, Fibrin/Slough, Subcutaneous material debrided: Instrument: Curette Bleeding: Minimum Hemostasis Achieved: Pressure End Time: 13:36 Procedural Pain: 0 Post Procedural Pain: 0 Response to Treatment: Procedure was tolerated well Post Debridement Measurements of Total Wound Length: (cm) 2.5 Width: (cm) 2.5 Depth: (cm) 0.9 Volume: (cm) 4.418 Character of Wound/Ulcer Post Stable Debridement: Severity of Tissue Post Debridement: Fat layer exposed Post Procedure Diagnosis Same as Pre-procedure Electronic Signature(s) Signed: 12/04/2016 4:47:30 PM By: Baltazar Najjar MD Signed: 12/04/2016 6:10:04 PM By: Elpidio Eric [name], RN [place], [place] ([id number]) Entered By: Elpidio Eric on 12/04/2016 13:37:50 [name] ([id number]) -------------------------------------------------------------------------------- HPI Details Patient Name: [name] Date of Service: 12/04/2016 12:45  PM Medical Record Number: [id number] Patient Account Number: [id number] Date of Birth/Sex: 1980-06-04 (35 y.o. Male) Treating RN: Clover Mealy, RN, [name], Rita Primary Care Provider: [name] Other Clinician: Referring Provider: [name] Treating Provider/Extender: Maxwell Caul Weeks in Treatment: 0 History of Present Illness HPI Description: 12/04/16; 36 year old man who comes into the clinic today for review of a wound on the posterior left calf. He tells me that is been there for about a year. He is not a diabetic he does smoke half a pack per day. He was seen in the ER on 11/20/16 felt to have cellulitis around the  wound and was given clindamycin. An x-ray did not show osteomyelitis. The patient initially tells me that he has a milk allergy that sets off a pruritic itching rash on his lower legs which she scratches incessantly and he thinks that's what may have set up the wound. He has been using various topical antibiotics and ointments without any effect. He works in a [employer]ing [employer] and is on his feet all day. He does not have a prior history of wounds however he does have the rash on both lower legs the right arm and the ventral aspect of his left arm. These are excoriations and clearly have had scratching however there are of macular looking areas on both legs including a substantial larger area on the right leg. This does not have an underlying open area. There is no blistering. 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 He seemed to recognize this. 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 He is not in any treatment for this. He's had no recent travel Electronic Signature(s) Signed: 12/04/2016 4:47:30 PM By: Baltazar Najjar MD Entered By: Baltazar Najjar on  12/04/2016 13:59:45 [name] ([id number]) -------------------------------------------------------------------------------- Physical Exam Details Patient Name: [name] Date of Service: 12/04/2016 12:45 PM Medical Record Number: [id number] Patient Account Number: [id number] Date of Birth/Sex: Nov 08, 1980 (35 y.o. Male) Treating RN: Clover Mealy, RN, [name], Rita Primary Care Provider: [name] Other Clinician: Referring Provider: [name] Treating Provider/Extender: Maxwell Caul Weeks in Treatment: 0 Constitutional Sitting or standing Blood Pressure is within target range for patient.. Pulse regular and within target range for patient.Marland Kitchen Respirations regular, non-labored and within target range.. Temperature is normal and within the target range for the patient.Marland Kitchen appears in no distress. Eyes Conjunctivae clear. No discharge. Respiratory Respiratory effort is easy and symmetric bilaterally. Rate is normal at rest and on room air.. Bilateral breath sounds are clear and equal in all lobes with no wheezes, rales or rhonchi.. Cardiovascular Heart rhythm and rate regular, without murmur or gallop.. Femoral arteries without bruits and pulses strong.. Pedal pulses palpable and strong bilaterally.. Large legs without pitting edema. Wouldn't be impossible that he has some degree of chronic venous insufficiency with lymphedema. Gastrointestinal (GI) Obese no masses no tenderness. Lymphatic None palpable in the popliteal or inguinal area. Integumentary (Hair, Skin) The patient has several other excoriated areas on the right anterior leg which he says are from scratching. Also nondescript macular areas on both lower legs. On his right arm there is superficial excoriations which patient states are pruritic. Psychiatric No evidence of depression, anxiety, or agitation. Calm, cooperative, and communicative. Appropriate interactions and affect.. Notes Wound exam; there came to see  [place] about is a ragged circular wound on the left posterior lower leg. Necrotic surface. There is erythema around this that is nontender although this would look like cellulitis the absence of tenderness would make this unlikely. A punch biopsy was obtained from the superior outer aspect of this wound. Then using a #5 curet the necrotic surface of this was removed. Hemostasis with direct pressure. Electronic Signature(s) Signed: 12/04/2016 4:47:30 PM By: Baltazar Najjar MD Entered By: Baltazar Najjar on 12/04/2016 14:03:00 [name] ([id number]) -------------------------------------------------------------------------------- Physician Orders Details Patient Name: [name] Date of Service: 12/04/2016 12:45 PM Medical Record Number:  [id number] Patient Account Number: [id number] Date of Birth/Sex: May 13, 1981 (35 y.o. Male) Treating RN: Afful, RN, [name], Rita Primary Care Provider: [name] Other Clinician: Referring Provider: [name] Treating Provider/Extender: [place] in Treatment: 0 Verbal / Phone Orders: No Diagnosis Coding Wound Cleansing Wound #1 Left,Lateral Lower Leg o Cleanse wound with mild soap and water Anesthetic Wound #1 Left,Lateral Lower Leg o Topical Lidocaine 4% cream applied to wound bed prior to debridement Skin Barriers/Peri-Wound Care Wound #1 Left,Lateral Lower Leg o Barrier cream Primary Wound Dressing Wound #1 Left,Lateral Lower Leg o Aquacel Ag Secondary Dressing Wound #1 Left,Lateral Lower Leg o Dry Gauze o Boardered Foam Dressing Dressing Change Frequency Wound #1 Left,Lateral Lower Leg o Change dressing every other day. Follow-up Appointments Wound #1 Left,Lateral Lower Leg o Return Appointment in 1 week. Edema Control Wound #1 Left,Lateral Lower Leg o Patient to wear own compression stockings o Elevate legs to the level of the heart and pump ankles as often as possible Bari, Efstathios  ([id number]) Additional Orders / Instructions Wound #1 Left,Lateral Lower Leg o Increase protein intake. o Activity as tolerated Consults o Dermatology Laboratory o Tissue Pathology biopsy report (PATH) - left lateral leg oooo LOINC Code: [phone] oooo Convenience Name: [facility] Path Bx report Notes Will request DERM info from previous dermatology Electronic Signature(s) Signed: 12/04/2016 4:47:30 PM By: Baltazar Najjar MD Signed: 12/04/2016 6:10:04 PM By: Elpidio Eric [name], RN Entered By: Elpidio Eric on 12/04/2016 14:00:26 [name] ([id number]) -------------------------------------------------------------------------------- Problem List Details Patient Name: [name] Date of Service: 12/04/2016 12:45 PM Medical Record Number: [id number] Patient Account Number: [id number] Date of Birth/Sex: 05/11/1981 (35 y.o. 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 Male) Treating RN: Clover Mealy, RN, [name], Rita Primary Care Provider: [name] Other Clinician: Referring Provider: [name] Treating  Provider/Extender: Maxwell Caul Weeks in Treatment: 0 Subjective Chief Complaint Information obtained from Patient 12/04/16; patient is here for review of a wound on his posterior left calf that has been present for about a year History of Present Illness (HPI) 12/04/16; 36 year old man who comes into the clinic today for review of a wound on the posterior left calf. He tells me that is been there for about a year. He is not a diabetic he does smoke half a pack per day. He was seen in the ER on 11/20/16 felt to have cellulitis around the wound and was given clindamycin. An x-ray did not show osteomyelitis. The patient initially tells me that he has a milk allergy that sets off a pruritic itching rash on his lower legs which she scratches incessantly and he thinks that's what may have set up the wound. He has been using various topical antibiotics and ointments without any effect. He works in a [employer]ing [employer] and is on his feet all day. He does not have a prior history of wounds however he does have  the rash on both lower legs the right arm and the ventral aspect of his left arm. These are excoriations and clearly have had scratching however there are of macular looking areas on both legs including a substantial larger area on the right leg. This does not have an underlying open area. There is no blistering. 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 He seemed to recognize this. 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 He is not in any treatment for this. He's had no recent travel Wound History Patient presents with 1 open wound that has been present for approximately 1 year. Patient has been treating wound in the following manner: neosporin. Laboratory tests have not been  performed in the last month. Patient reportedly has not tested positive for an antibiotic resistant organism. Patient reportedly has not tested positive for osteomyelitis. Patient reportedly has not had testing performed to evaluate circulation in the legs. Patient experiences the following problems associated with their wounds: infection, swelling. Patient History Information obtained from Patient, Chart. Allergies mik, Biaxin, seasonal Family History [name], [name] ([id number]) Diabetes - Mother, Maternal Grandparents, Heart Disease - Mother, Maternal Grandparents, Hereditary Spherocytosis - Siblings, Hypertension - Maternal Grandparents, Stroke - Siblings, No family history of Cancer, Kidney Disease, Lung Disease, Seizures, Thyroid Problems, Tuberculosis. Social History Current some day smoker, Marital Status - Married, Alcohol Use - Rarely, Drug Use - No History, Caffeine Use - Moderate. Medical History Eyes Denies history of Cataracts, Glaucoma, Optic Neuritis Ear/Nose/Mouth/Throat Denies history of Chronic sinus problems/congestion, Middle ear problems Hematologic/Lymphatic Denies history of Anemia, Hemophilia, Human Immunodeficiency Virus, Lymphedema, Sickle Cell Disease Respiratory Patient has history of Sleep Apnea - cpap Denies history of Aspiration, Asthma, Chronic Obstructive Pulmonary Disease (COPD), Pneumothorax Cardiovascular Patient has history of Hypertension Denies history of Angina, Arrhythmia, Congestive Heart Failure, Coronary Artery Disease, Deep Vein Thrombosis, Hypotension, Myocardial Infarction, Peripheral Arterial Disease, Peripheral Venous Disease, Phlebitis, Vasculitis Gastrointestinal Patient has history of Colitis Endocrine Denies history of Type I Diabetes, Type II Diabetes Genitourinary Denies history of End Stage Renal Disease Immunological Denies history of Lupus Erythematosus, Raynaud s, Scleroderma Integumentary (Skin) Denies history of  History of Burn, History of pressure wounds Musculoskeletal Denies history of Gout, Rheumatoid Arthritis, Osteoarthritis, Osteomyelitis Neurologic Denies history of Dementia, Neuropathy, Quadriplegia, Paraplegia, Seizure Disorder Oncologic Denies history of Received Chemotherapy, Received Radiation Psychiatric Denies history of Anorexia/bulimia, Confinement Anxiety Review of Systems (ROS) Eyes Complains or has symptoms of Glasses / Contacts. Ear/Nose/Mouth/Throat The patient has no complaints or symptoms. Hematologic/Lymphatic The patient has no complaints or symptoms. Respiratory The patient has no complaints or symptoms. JASAUN, CARN ([id number]) Cardiovascular The patient has no complaints or symptoms. Gastrointestinal The patient has no complaints or symptoms. Endocrine The patient has no complaints or symptoms. Genitourinary The patient has no complaints or symptoms. Immunological The patient has no complaints or symptoms. Integumentary (Skin) Complains or has symptoms of Wounds, Breakdown, Swelling. Musculoskeletal The patient has no complaints or symptoms. Neurologic The patient has no complaints or symptoms. Oncologic The patient has no complaints or symptoms. Psychiatric The patient has no complaints or symptoms. Objective Constitutional Sitting or standing Blood Pressure is within target range for patient.. Pulse regular and within target range for patient.Marland Kitchen Respirations regular, non-labored and within target range.. Temperature  is normal and within the target range for the patient.Marland Kitchen. appears in no distress. Vitals Time Taken: 1:00 PM, Height: 71 in, Source: Stated, Weight: 338 lbs, Source: Measured, BMI: 47.1, Temperature: 98.2 F, Pulse: 70 bpm, Respiratory Rate: 17 breaths/min, Blood Pressure: 135/77 mmHg. Eyes Conjunctivae clear. No discharge. Respiratory Respiratory effort is easy and symmetric bilaterally. Rate is normal at rest and on room air..  Bilateral breath sounds are clear and equal in all lobes with no wheezes, rales or rhonchi.. Cardiovascular Heart rhythm and rate regular, without murmur or gallop.. Femoral arteries without bruits and pulses strong.. Pedal pulses palpable and strong bilaterally.. Large legs without pitting edema. Wouldn't be impossible that Keithley, Caulin ([id number]) he has some degree of chronic venous insufficiency with lymphedema. Gastrointestinal (GI) Obese no masses no tenderness. Lymphatic None palpable in the popliteal or inguinal area. Psychiatric No evidence of depression, anxiety, or agitation. Calm, cooperative, and communicative. Appropriate interactions and affect.. General Notes: Wound exam; there came to see us about is a ragged circular wound on the left posterior lower leg. Necrotic surface. There is erythema around this that is nontender although this would look like cellulitis the absence of tenderness would make this unlikely. A punch biopsy was obtained from the superior outer aspect of this wound. Then using a #5 curet the necrotic surface of this was removed. Hemostasis with direct pressure. Integumentary (Hair, Skin) The patient has several other excoriated areas on the right anterior leg which he says are from scratching. Also nondescript macular areas on both lower legs. On his right arm there is superficial excoriations which patient states are pruritic. Wound #1 status is Open. Original cause of wound was Gradually Appeared. The wound is located on the Left,Lateral Lower Leg. The wound measures 2.5cm length x 2.5cm width x 0.8cm depth; 4.909cm^2 area and 3.927cm^3 volume. There is Fat Layer (Subcutaneous Tissue) Exposed exposed. There is no tunneling or undermining noted. There is a medium amount of serosanguineous drainage noted. The wound margin is distinct with the outline attached to the wound base. There is no granulation within the wound bed. There is a large (67-100%)  amount of necrotic tissue within the wound bed including Adherent Slough. The periwound skin appearance exhibited: Induration, Hemosiderin Staining, Mottled. The periwound skin appearance did not exhibit: Callus, Crepitus, Excoriation, Rash, Scarring, Dry/Scaly, Maceration, Atrophie Blanche, Cyanosis, Ecchymosis, Pallor, Rubor, Erythema. Periwound temperature was noted as No Abnormality. The periwound has tenderness on palpation. 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 There was a Skin/Subcutaneous Tissue Debridement ([id number]) debridement with total area of 6.25 sq cm performed by Maxwell CaulOBSON, Jacquez Sheetz G, MD. with the following instrument(s): Curette to remove Non-Viable tissue/material including Exudate, Fat Layer (and Subcutaneous Tissue) Exposed, Fibrin/Slough, and Subcutaneous after achieving pain control using Lidocaine 4% Topical Solution. A time out was conducted at 13:34, prior to the start of the procedure. A Minimum amount of bleeding was controlled with Pressure. The procedure was tolerated well with a pain level of 0 throughout and a pain level of 0 following the procedure. Post Debridement Measurements: 2.5cm length x 2.5cm width x 0.9cm depth; 4.418cm^3 volume. Character of Wound/Ulcer Post Debridement is stable. Severity of Tissue Post Debridement is: Fat layer exposed. Post  procedure Diagnosis Wound #1: Same as Pre-Procedure Pre-procedure diagnosis of Wound #1 is a Venous Leg Ulcer located on the Left, Lateral Lower Leg . There was a biopsy performed by Maxwell Caul, MD. There was a biopsy performed on Wound Margin, Peri-Ulcer  Tissue. The skin was cleansed and prepped with anti-septic followed by pain control using Lidocaine Injectable: 2%. Utilizing a 5 mm tissue punch, tissue was removed at its base with the following instrument(s): Forceps and Other and sent to pathology. A Moderate amount of bleeding was controlled with Silver Nitrate. A time out was conducted at 13:30, prior to the start of the procedure. The procedure was tolerated well with a pain level of 0 throughout and a pain level of 0 following the procedure. Post procedure Diagnosis Wound #1: Same as Pre-Procedure Plan Wound Cleansing: Wound #1 Left,Lateral Lower Leg: Cleanse wound with mild soap and water Anesthetic: Wound #1 Left,Lateral Lower Leg: Topical Lidocaine 4% cream applied to wound bed prior to debridement Skin Barriers/Peri-Wound Care: Wound #1 Left,Lateral Lower Leg: Barrier cream Primary Wound Dressing: Wound #1 Left,Lateral Lower Leg: Aquacel Ag Secondary Dressing: Wound #1 Left,Lateral Lower Leg: Bhola, Kwamane ([id number]) Dry Gauze Boardered Foam Dressing Dressing Change Frequency: Wound #1 Left,Lateral Lower Leg: Change dressing every other day. Follow-up Appointments: Wound #1 Left,Lateral Lower Leg: Return Appointment in 1 week. Edema Control: Wound #1 Left,Lateral Lower Leg: Patient to wear own compression stockings Elevate legs to the level of the heart and pump ankles as often as possible Additional Orders / Instructions: Wound #1 Left,Lateral Lower Leg: Increase protein intake. Activity as tolerated Laboratory ordered were: [facility] Bx report - left lateral leg Consults ordered were: Dermatology General Notes: Will request DERM info from previous dermatology #1 irregular punched out circular area over the left posterior calf that is been present for a year. The cause of this at the bedside was not obvious to me therefore I went ahead and did a number for punch biopsy of this area. #2 after the biopsy the  area was the bride it with a #5 curet. There is a large amount of necrotic material on the surface of the wound #3 clearly a more widespread dermatologic issue here. The patient states that he may have been told in [place] that he has possible pyoderma gangrenosum although I may be putting words in the patient's mouth. He also has quite sent ulcerative colitis which is associated with this diagnosis. It is likely he will need to see dermatology. #4 so over alginate with border foam for now change every 2 days Electronic Signature(s) Signed: 12/04/2016 4:47:30 PM By: Baltazar Najjar MD Entered By: Baltazar Najjar on 12/04/2016 14:04:46 Mielke, [name] ([id number]) -------------------------------------------------------------------------------- ROS/PFSH Details Patient Name: [name] Date of Service: 12/04/2016 12:45 PM Medical Record Number: [id number] Patient Account Number: [id number] Date of Birth/Sex: Oct 28, 1980 (35 y.o. Male) Treating RN: Afful, RN, [name], Rita Primary Care Provider: [name] Other Clinician: Referring Provider: [name] Treating Provider/Extender: Maxwell Caul Weeks in Treatment: 0 Information Obtained From Patient Chart Wound History Do you currently have one or more open woundso Yes How many open wounds do you currently haveo 1 Approximately how long have you had your woundso 1 year How have you been treating your wound(s) until nowo neosporin Has your wound(s) ever healed and then re-openedo No Have you had any lab work done in the past montho No Have you tested positive for an antibiotic resistant organism (MRSA, VRE)o No Have you tested positive for osteomyelitis (bone  infection)o No Have you had any tests for circulation on your legso No Have you had other problems associated with your woundso Infection, Swelling Eyes Complaints and Symptoms: Positive for: Glasses / Contacts Medical History: Negative for: Cataracts; Glaucoma; Optic  Neuritis Integumentary (Skin) Complaints and Symptoms: Positive for: Wounds; Breakdown; Swelling Medical History: Negative for: History of Burn; History of pressure wounds Ear/Nose/Mouth/Throat Complaints and Symptoms: No Complaints or Symptoms Medical History: Negative for: Chronic sinus problems/congestion; Middle ear problems Hematologic/Lymphatic Complaints and Symptoms: No Complaints or Symptoms Sidney, Khani ([id number]) Medical History: Negative for: Anemia; Hemophilia; Human Immunodeficiency Virus; Lymphedema; Sickle Cell Disease Respiratory Complaints and Symptoms: No Complaints or Symptoms Medical History: Positive for: Sleep Apnea - cpap Negative for: Aspiration; Asthma; Chronic Obstructive Pulmonary Disease (COPD); Pneumothorax Cardiovascular Complaints and Symptoms: No Complaints or Symptoms Medical History: Positive for: Hypertension Negative for: Angina; Arrhythmia; Congestive Heart Failure; Coronary Artery Disease; Deep Vein Thrombosis; Hypotension; Myocardial Infarction; Peripheral Arterial Disease; Peripheral Venous Disease; Phlebitis; Vasculitis Gastrointestinal Complaints and Symptoms: No Complaints or Symptoms Medical History: Positive for: Colitis Endocrine Complaints and Symptoms: No Complaints or Symptoms Medical History: Negative for: Type I Diabetes; Type II Diabetes Genitourinary Complaints and Symptoms: No Complaints or Symptoms Medical History: Negative for: End Stage Renal Disease Immunological Complaints and Symptoms: No Complaints or Symptoms Hulet, Waylyn ([id number]) Medical History: Negative for: Lupus Erythematosus; Raynaudos; Scleroderma Musculoskeletal Complaints and Symptoms: No Complaints or Symptoms Medical History: Negative for: Gout; Rheumatoid Arthritis; Osteoarthritis; Osteomyelitis Neurologic Complaints and Symptoms: No Complaints or Symptoms Medical History: Negative for: Dementia; Neuropathy; Quadriplegia;  Paraplegia; Seizure Disorder Oncologic Complaints and Symptoms: No Complaints or Symptoms Medical History: Negative for: Received Chemotherapy; Received Radiation Psychiatric Complaints and Symptoms: No Complaints or Symptoms Medical History: Negative for: Anorexia/bulimia; Confinement Anxiety Immunizations Pneumococcal Vaccine: Received Pneumococcal Vaccination: No Family and Social History Cancer: No; Diabetes: Yes - Mother, Maternal Grandparents; Heart Disease: Yes - Mother, Maternal Grandparents; Hereditary Spherocytosis: Yes - Siblings; Hypertension: Yes - Maternal Grandparents; Kidney Disease: No; Lung Disease: No; Seizures: No; Stroke: Yes - Siblings; Thyroid Problems: No; Tuberculosis: No; Current some day smoker; Marital Status - Married; Alcohol Use: Rarely; Drug Use: No History; Caffeine Use: Moderate; Financial Concerns: No; Food, Clothing or Shelter Needs: No; Support System Lacking: No; Transportation Concerns: No; Advanced Directives: No; Living Will: No Electronic Signature(s) Signed: 12/04/2016 4:47:30 PM By: Baltazar Najjar MD Doral, [name] ([id number]) Signed: 12/04/2016 6:10:04 PM By: Elpidio Eric [name], RN Entered By: Elpidio Eric on 12/04/2016 13:00:25 [name] ([id number]) -------------------------------------------------------------------------------- SuperBill Details Patient Name: [name] Date of Service: 12/04/2016 Medical Record Number: [id number] Patient Account Number: [id number] Date of Birth/Sex: [birth year] (35 y.o. Male) Treating RN: Clover Mealy, RN, [name], Rita Primary Care Provider: [name] Other Clinician: Referring Provider: [name] Treating Provider/Extender: Maxwell Caul Weeks in Treatment: 0 Diagnosis Coding ICD-10 Codes Code Description [phone] 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sis of muscle I87.2 Venous insufficiency (chronic) (peripheral) L88 Pyoderma gangrenosum Facility Procedures CPT4 Code:  [id number] Description: 99214 - WOUND CARE VISIT-LEV 4 EST PT Modifier: Quantity: 1 CPT4 Code: [id number] Description: 11042 - DEB SUBQ TISSUE 20 SQ CM/< ICD-10 Description Diagnosis L97.223 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 Modifier: sis of muscle Quantity: 1 Physician Procedures CPT4 Code: 8416606 Description: 99204 - WC PHYS LEVEL 4 - NEW PT ICD-10 Description Diagnosis L97.223 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

## 2016-12-05 NOTE — Progress Notes (Signed)
[name], [name] ([id number]) Visit Report for 12/04/2016 Allergy List Details Patient Name: [name], [name] Date of Service: 12/04/2016 12:45 PM Medical Record Number: [id number] Patient Account Number: [id number] Date of Birth/Sex: 1980/09/20 (35 y.o. Male) Treating RN: Baruch Gouty, RN, [name], [place] Primary Care Tyeesha Riker: [name] Other Clinician: Referring Azani Brogdon: [name] Treating Tamari Busic/Extender: Ricard Dillon Weeks in Treatment: 0 Allergies Active Allergies mik Biaxin seasonal Allergy Notes Electronic Signature(s) Signed: 12/04/2016 6:10:04 PM By: Regan Lemming [name], RN Entered By: Regan Lemming on 12/04/2016 12:56:15 [name] ([id number]) -------------------------------------------------------------------------------- [place] Details Patient Name: [name] Date of Service: 12/04/2016 12:45 PM Medical Record Number: [id number] Patient Account Number: [id number] Date of Birth/Sex: Feb 11, 1981 (35 y.o. Male) Treating RN: Baruch Gouty, RN, [name], [place] Primary Care Kaniah Rizzolo: [name] Other Clinician: Referring Shellye Zandi: [name] Treating Lilliemae Fruge/Extender: Tito Dine in Treatment: 0 Visit Information Patient Arrived: Ambulatory Arrival Time: 12:53 Accompanied By: self Transfer Assistance: None Patient Identification Verified: Yes Secondary Verification Process Yes Completed: Patient Requires Transmission-Based No Precautions: Patient Has Alerts: No Electronic Signature(s) Signed: 12/04/2016 6:10:04 PM By: Regan Lemming [name], RN Entered By: Regan Lemming on 12/04/2016 12:54:20 [name] ([id number]) -------------------------------------------------------------------------------- Clinic Level of Care Assessment Details Patient Name: [name] Date of Service: 12/04/2016 12:45 PM Medical Record Number: [id number] Patient Account Number: [id number] Date of Birth/Sex: 10-13-1980 (35 y.o. Male) Treating RN: Baruch Gouty, RN, [name],  [place] Primary Care Marcelis Wissner: [name] Other Clinician: Referring Kaytlan Behrman: [name] Treating Karem Tomaso/Extender: Tito Dine in Treatment: 0 Clinic Level of Care Assessment Items TOOL 1 Quantity Score []  - Use when EandM and Procedure is performed on INITIAL visit 0 ASSESSMENTS - Nursing Assessment / Reassessment X - General Physical Exam (combine w/ comprehensive assessment (listed just 1 20 below) when performed on new pt. evals) X - Comprehensive Assessment (HX, ROS, Risk Assessments, Wounds Hx, etc.) 1 25 ASSESSMENTS - Wound and Skin Assessment / Reassessment X - Dermatologic / Skin Assessment (not related to wound area) 1 10 ASSESSMENTS - Ostomy and/or Continence Assessment and Care []  - Incontinence Assessment and Management 0 []  - Ostomy Care Assessment and Management (repouching, etc.) 0 PROCESS - Coordination of Care X - Simple Patient / Family Education for ongoing care 1 15 []  - Complex (extensive) Patient / Family Education for ongoing care 0 X - Staff obtains Programmer, systems, Records, Test Results / Process Orders 1 10 X - Staff telephones HHA, Nursing Homes / Clarify orders / etc 1 10 []  - Routine Transfer to another Facility (non-emergent condition) 0 []  - Routine Hospital Admission (non-emergent condition) 0 X - New Admissions / Biomedical engineer / Ordering NPWT, Apligraf, etc. 1 15 []  - Emergency Hospital Admission (emergent condition) 0 PROCESS - Special Needs []  - Pediatric / Minor Patient Management 0 []  - Isolation Patient Management 0 Flitton, Deandra ([id number]) []  - Hearing / Language / Visual special needs 0 []  - Assessment of Community assistance (transportation, D/C planning, etc.) 0 []  - Additional assistance / Altered mentation 0 []  - Support Surface(s) Assessment (bed, cushion, seat, etc.) 0 INTERVENTIONS - Miscellaneous []  - External ear exam 0 []  - Patient Transfer (multiple staff / Civil Service fast streamer / Similar devices) 0 []  -  Simple Staple / Suture removal (25 or less) 0 []  - Complex Staple / Suture removal (26 or more) 0 []  - Hypo/Hyperglycemic Management (do not check if billed separately) 0 X - Ankle / Brachial Index (ABI) - do not check if billed separately 1 15 Has the patient been seen at the  hospital within the last three years: Yes Total Score: 120 Level Of Care: New/Established - Level 4 Electronic Signature(s) Signed: 12/04/2016 6:10:04 PM By: Regan Lemming [name], RN Entered By: Regan Lemming on 12/04/2016 13:41:19 [name] ([id number]) -------------------------------------------------------------------------------- Encounter Discharge Information Details Patient Name: [name] Date of Service: 12/04/2016 12:45 PM Medical Record Number: [id number] Patient Account Number: [id number] Date of Birth/Sex: 1980-09-07 (35 y.o. Male) Treating RN: Baruch Gouty, RN, [name], [place] Primary Care Allison Silva: [name] Other Clinician: Referring Jasier Calabretta: [name] Treating Naryah Clenney/Extender: Tito Dine in Treatment: 0 Encounter Discharge Information Items Discharge Pain Level: 0 Discharge Condition: Stable Ambulatory Status: Ambulatory Discharge Destination: Home Transportation: Private Auto Accompanied By: self Schedule Follow-up Appointment: No Medication Reconciliation completed and provided to Patient/Care No Austine Kelsay: Provided on Clinical Summary of Care: 12/04/2016 Form Type Recipient Paper Patient RL Electronic Signature(s) Signed: 12/04/2016 1:56:33 PM By: Ruthine Dose Entered By: Ruthine Dose on 12/04/2016 13:56:33 Starkey, [name] ([id number]) -------------------------------------------------------------------------------- Lower Extremity Assessment Details Patient Name: [name] Date of Service: 12/04/2016 12:45 PM Medical Record Number: [id number] Patient Account Number: [id number] Date of Birth/Sex: 1981-04-10 (35 y.o. Male) Treating RN: Baruch Gouty, RN, [name], [place] Primary Care  Kashtyn Jankowski: [name] Other Clinician: Referring Siddhartha Hoback: [name] Treating Mishawn Didion/Extender: Ricard Dillon Weeks in Treatment: 0 Edema Assessment Assessed: [Left: No] [Right: No] E[Left: dema] [Right: :] Calf Left: Right: Point of Measurement: 33 cm From Medial Instep 52.5 cm cm Ankle Left: Right: Point of Measurement: 9 cm From Medial Instep 31 cm cm Vascular Assessment Claudication: Claudication Assessment [Left:None] Pulses: Dorsalis Pedis Palpable: [Left:Yes] Posterior Tibial Extremity colors, hair growth, and conditions: Extremity Color: [Left:Mottled] Hair Growth on Extremity: [Left:Yes] Temperature of Extremity: [Left:Warm] Capillary Refill: [Left:< 3 seconds] Blood Pressure: Brachial: [Left:137] Dorsalis Pedis: 158 [Left:Dorsalis Pedis:] Ankle: Posterior Tibial: [Left:Posterior Tibial: 1.15] Toe Nail Assessment Left: Right: Thick: No Discolored: No Deformed: No Improper Length and Hygiene: No MICO, SPARK ([id number]) Electronic Signature(s) Signed: 12/04/2016 6:10:04 PM By: Regan Lemming [name], RN Entered By: Regan Lemming on 12/04/2016 13:14:58 [name] ([id number]) -------------------------------------------------------------------------------- Multi Wound Chart Details Patient Name: [name] Date of Service: 12/04/2016 12:45 PM Medical Record Number: [id number] Patient Account Number: [id number] Date of Birth/Sex: [birth year] (35 y.o. Male) Treating RN: Baruch Gouty, RN, [name], [place] Primary Care Maxim Bedel: [name] Other Clinician: Referring Dawna Jakes: [name] Treating Hanh Kertesz/Extender: Ricard Dillon Weeks in Treatment: 0 Vital Signs Height(in): 71 Pulse(bpm): 70 Weight(lbs): 338 Blood Pressure 135/77 (mmHg): Body Mass Index(BMI): 47 Temperature(F): 98.2 Respiratory Rate 17 (breaths/min): Photos: [1:No Photos] [N/A:N/A] Wound Location: [1:Left Lower Leg - Lateral] [N/A:N/A] Wounding Event: [1:Gradually  Appeared] [N/A:N/A] Primary Etiology: [1:Venous Leg Ulcer] [N/A:N/A] Comorbid History: [1:Sleep Apnea, Hypertension, Colitis] [N/A:N/A] Date Acquired: [1:11/18/2015] [N/A:N/A] Weeks of Treatment: [1:0] [N/A:N/A] Wound Status: [1:Open] [N/A:N/A] Measurements L x W x D 2.5x2.5x0.8 [N/A:N/A] (cm) Area (cm) : [1:4.909] [N/A:N/A] Volume (cm) : [1:3.927] [N/A:N/A] Classification: [1:Full Thickness Without Exposed Support Structures] [N/A:N/A] Exudate Amount: [1:Medium] [N/A:N/A] Exudate Type: [1:Serosanguineous] [N/A:N/A] Exudate Color: [1:red, brown] [N/A:N/A] Wound Margin: [1:Distinct, outline attached] [N/A:N/A] Granulation Amount: [1:None Present (0%)] [N/A:N/A] Necrotic Amount: [1:Large (67-100%)] [N/A:N/A] Exposed Structures: [1:Fat Layer (Subcutaneous Tissue) Exposed: Yes Fascia: No Tendon: No Muscle: No Joint: No Bone: No] [N/A:N/A] Epithelialization: [1:None] [N/A:N/A] Debridement: Debridement (45809- N/A N/A 11047) Pre-procedure 13:34 N/A N/A Verification/Time Out Taken: Pain Control: Lidocaine 4% Topical N/A N/A Solution Tissue Debrided: Fibrin/Slough, Fat, N/A N/A Exudates, Subcutaneous Level: Skin/Subcutaneous N/A N/A Tissue Debridement Area (sq 6.25 N/A N/A cm): Instrument: Curette N/A N/A Bleeding: Minimum N/A N/A Hemostasis  Achieved: Pressure N/A N/A Procedural Pain: 0 N/A N/A Post Procedural Pain: 0 N/A N/A Debridement Treatment Procedure was tolerated N/A N/A Response: well Post Debridement 2.5x2.5x0.9 N/A N/A Measurements L x W x D (cm) Post Debridement 4.418 N/A N/A Volume: (cm) Periwound Skin Texture: Induration: Yes N/A N/A Excoriation: No Callus: No Crepitus: No Rash: No Scarring: No Periwound Skin Maceration: No N/A N/A Moisture: Dry/Scaly: No Periwound Skin Color: Hemosiderin Staining: Yes N/A N/A Mottled: Yes Atrophie Blanche: No Cyanosis: No Ecchymosis: No Erythema: No Pallor: No Rubor: No Temperature: No Abnormality N/A  N/A Tenderness on Yes N/A N/A Palpation: Wound Preparation: Ulcer Cleansing: N/A N/A Rinsed/Irrigated with Saline Topical Anesthetic STIGGER, Jibril ([id number]) Applied: Other: lidocaine 4% Procedures Performed: Biopsy N/A N/A Debridement Treatment Notes Wound #1 (Left, Lateral Lower Leg) 1. Cleansed with: Clean wound with Normal Saline 3. Peri-wound Care: Barrier cream Moisturizing lotion 4. Dressing Applied: Aquacel Ag 5. Secondary Dressing Applied Bordered Foam Dressing Dry Gauze 7. Secured with Recruitment consultant) Signed: 12/04/2016 4:47:30 PM By: Linton Ham MD Entered By: Linton Ham on 12/04/2016 13:50:05 [name] ([id number]) -------------------------------------------------------------------------------- Pain Assessment Details Patient Name: [name] Date of Service: 12/04/2016 12:45 PM Medical Record Number: [id number] Patient Account Number: [id number] Date of Birth/Sex: Oct 06, 1980 (35 y.o. Male) Treating RN: Baruch Gouty, RN, [name], [place] Primary Care Winfrey Chillemi: [name] Other Clinician: Referring Tej Murdaugh: [name] Treating Artemus Romanoff/Extender: Ricard Dillon Weeks in Treatment: 0 Active Problems Location of Pain Severity and Description of Pain Patient Has Paino No Site Locations With Dressing Change: No Pain Management and Medication Current Pain Management: Electronic Signature(s) Signed: 12/04/2016 6:10:04 PM By: Regan Lemming [name], RN Entered By: Regan Lemming on 12/04/2016 12:54:26 [name] ([id number]) -------------------------------------------------------------------------------- Patient/Caregiver Education Details Patient Name: [name] Date of Service: 12/04/2016 12:45 PM Medical Record Number: [id number] Patient Account Number: [id number] Date of Birth/Gender: 20-Jan-1981 (35 y.o. Male) Treating RN: Baruch Gouty, RN, [name], [place] Primary Care Physician: [name] Other Clinician: Referring Physician: [name] Treating Physician/Extender: Tito Dine in Treatment: 0 Education Assessment Education Provided To: Patient Education Topics Provided Wound Debridement: Methods: Explain/Verbal Responses: Reinforcements needed Wound/Skin Impairment: Methods: Explain/Verbal Responses: Reinforcements needed Electronic Signature(s) Signed: 12/04/2016 6:10:04 PM By: Regan Lemming [name], RN Entered By: Regan Lemming on 12/04/2016 13:42:40 [name] ([id number]) -------------------------------------------------------------------------------- Wound Assessment Details Patient Name: [name] Date of Service: 12/04/2016 12:45 PM Medical Record Number: [id number] Patient Account Number: [id number] Date of Birth/Sex: June 11, 1980 (35 y.o. Male) Treating RN: Baruch Gouty, RN, [name], [place] Primary Care Marc Sivertsen: [name] Other Clinician: Referring Taelyn Nemes: [name] Treating Savonna Birchmeier/Extender: Ricard Dillon Weeks in Treatment: 0 Wound Status Wound Number: 1 Primary Etiology: Venous Leg Ulcer Wound Location: Left Lower Leg - Lateral Wound Status: Open Wounding Event: Gradually Appeared Comorbid Sleep Apnea, Hypertension, History: Colitis Date Acquired: 11/18/2015 Weeks Of Treatment: 0 Clustered Wound: No Photos Photo Uploaded By: Regan Lemming on 12/04/2016 18:24:39 Wound Measurements Length: (cm) 2.5 Width: (cm) 2.5 Depth: (cm) 0.8 Area: (cm) 4.909 Volume: (cm) 3.927 % Reduction in Area: % Reduction in Volume: Epithelialization: None Tunneling: No Undermining: No Wound Description Full Thickness Without Exposed Classification: Support Structures Wound Margin: Distinct, outline attached Exudate Medium Amount: Exudate Type: Serosanguineous Exudate Color: red, brown Foul Odor After Cleansing: No Slough/Fibrino Yes Wound Bed Granulation Amount: None Present (0%) Exposed Structure Necrotic Amount: Large (67-100%) Fascia Exposed: No Necrotic Quality:  Adherent Slough Fat Layer (Subcutaneous Tissue) Exposed: Yes Rotondo, Rylyn ([id number]) Tendon Exposed: No Muscle Exposed: No Joint Exposed: No Bone Exposed: No  Periwound Skin Texture Texture Color No Abnormalities Noted: No No Abnormalities Noted: No Callus: No Atrophie Blanche: No Crepitus: No Cyanosis: No Excoriation: No Ecchymosis: No Induration: Yes Erythema: No Rash: No Hemosiderin Staining: Yes Scarring: No Mottled: Yes Pallor: No Moisture Rubor: No No Abnormalities Noted: No Dry / Scaly: No Temperature / Pain Maceration: No Temperature: No Abnormality Tenderness on Palpation: Yes Wound Preparation Ulcer Cleansing: Rinsed/Irrigated with Saline Topical Anesthetic Applied: Other: lidocaine 4%, Treatment Notes Wound #1 (Left, Lateral Lower Leg) 1. Cleansed with: Clean wound with Normal Saline 3. Peri-wound Care: Barrier cream Moisturizing lotion 4. Dressing Applied: Aquacel Ag 5. Secondary Dressing Applied Bordered Foam Dressing Dry Gauze 7. Secured with Recruitment consultant) Signed: 12/04/2016 6:10:04 PM By: Regan Lemming [name], RN Entered By: Regan Lemming on 12/04/2016 13:09:02 [name] ([id number]) -------------------------------------------------------------------------------- Vitals Details Patient Name: [name] Date of Service: 12/04/2016 12:45 PM Medical Record Number: [id number] Patient Account Number: [id number] Date of Birth/Sex: Dec 07, 1980 (35 y.o. Male) Treating RN: Baruch Gouty, RN, [name], [place] Primary Care Britaney Espaillat: [name] Other Clinician: Referring Radhika Dershem: [name] Treating Jarian Longoria/Extender: Tito Dine in Treatment: 0 Vital Signs Time Taken: 13:00 Temperature (F): 98.2 Height (in): 71 Pulse (bpm): 70 Source: Stated Respiratory Rate (breaths/min): 17 Weight (lbs): 338 Blood Pressure (mmHg): 135/77 Source: Measured Reference Range: 80 - 120 mg / dl Body Mass Index (BMI): 47.1 Electronic  Signature(s) Signed: 12/04/2016 6:10:04 PM By: Regan Lemming [name], RN Entered By: Regan Lemming on 12/04/2016 13:03:19

## 2016-12-10 LAB — SURGICAL PATHOLOGY

## 2016-12-11 ENCOUNTER — Encounter: Payer: 59 | Admitting: Internal Medicine

## 2016-12-11 DIAGNOSIS — L97222 Non-pressure chronic ulcer of left calf with fat layer exposed: Secondary | ICD-10-CM | POA: Diagnosis not present

## 2016-12-11 DIAGNOSIS — I872 Venous insufficiency (chronic) (peripheral): Secondary | ICD-10-CM | POA: Diagnosis not present

## 2016-12-11 DIAGNOSIS — L97223 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sis of muscle: Secondary | ICD-10-CM | POA: Diagnosis not present

## 2016-12-13 NOTE — Progress Notes (Signed)
[name], [name] ([id number]) Visit Report for 12/11/2016 Arrival Information Details Patient Name: [name], [name] Date of Service: 12/11/2016 8:15 AM Medical Record Number: [id number] Patient Account Number: [id number] Date of Birth/Sex: 09-22-1980 (35 y.o. Male) Treating RN: Baruch Gouty, RN, [name], [place] Primary Care Lasaro Primm: [name] Other Clinician: Referring Raoul Ciano: [name] Treating Chaelyn Bunyan/Extender: Tito Dine in Treatment: 1 Visit Information History Since Last Visit All ordered tests and consults were completed: No Patient Arrived: Ambulatory Added or deleted any medications: No Arrival Time: 08:20 Any new allergies or adverse reactions: No Accompanied By: self Had a fall or experienced change in No Transfer Assistance: None activities of daily living that may affect Patient Identification Verified: Yes risk of falls: Secondary Verification Process Yes Signs or symptoms of abuse/neglect since last No Completed: visito Patient Requires Transmission-Based No Has Dressing in Place as Prescribed: Yes Precautions: Pain Present Now: No Patient Has Alerts: No Electronic Signature(s) Signed: 12/11/2016 2:24:03 PM By: Regan Lemming [name], RN Entered By: Regan Lemming on 12/11/2016 08:20:53 [name] ([id number]) -------------------------------------------------------------------------------- Encounter Discharge Information Details Patient Name: [name] Date of Service: 12/11/2016 8:15 AM Medical Record Number: [id number] Patient Account Number: [id number] Date of Birth/Sex: Apr 20, 1981 (35 y.o. Male) Treating RN: Baruch Gouty, RN, [name], [place] Primary Care Esty Ahuja: [name] Other Clinician: Referring Kalep Full: [name] Treating Calah Gershman/Extender: Tito Dine in Treatment: 1 Encounter Discharge Information Items Discharge Pain Level: 0 Discharge Condition: Stable Ambulatory Status: Ambulatory Discharge Destination:  Home Transportation: Private Auto Accompanied By: self Schedule Follow-up Appointment: No Medication Reconciliation completed and provided to Patient/Care No Myrtha Tonkovich: Provided on Clinical Summary of Care: 12/11/2016 Form Type Recipient Paper Patient RL Electronic Signature(s) Signed: 12/11/2016 9:09:38 AM By: Ruthine Dose Entered By: Ruthine Dose on 12/11/2016 09:09:38 [name] ([id number]) -------------------------------------------------------------------------------- Lower Extremity Assessment Details Patient Name: [name] Date of Service: 12/11/2016 8:15 AM Medical Record Number: [id number] Patient Account Number: [id number] Date of Birth/Sex: 1981/03/20 (35 y.o. Male) Treating RN: Baruch Gouty, RN, [name], [place] Primary Care Sony Schlarb: [name] Other Clinician: Referring Tamsyn Owusu: [name] Treating Ophelia Sipe/Extender: Ricard Dillon Weeks in Treatment: 1 Edema Assessment Assessed: [Left: No] [Right: Yes] E[Left: dema] [Right: :] Calf Left: Right: Point of Measurement: 33 cm From Medial Instep 52.1 cm cm Ankle Left: Right: Point of Measurement: 9 cm From Medial Instep 31 cm cm Vascular Assessment Claudication: Claudication Assessment [Left:None] Pulses: Dorsalis Pedis Palpable: [Left:Yes] Posterior Tibial Extremity colors, hair growth, and conditions: Extremity Color: [Left:Mottled] Hair Growth on Extremity: [Left:Yes] Temperature of Extremity: [Left:Warm] Capillary Refill: [Left:< 3 seconds] Electronic Signature(s) Signed: 12/11/2016 2:24:03 PM By: Regan Lemming [name], RN Entered By: Regan Lemming on 12/11/2016 08:50:45 [name] ([id number]) -------------------------------------------------------------------------------- Multi Wound Chart Details Patient Name: [name] Date of Service: 12/11/2016 8:15 AM Medical Record Number: [id number] Patient Account Number: [id number] Date of Birth/Sex: 1981-02-28 (35 y.o. Male) Treating RN: Baruch Gouty, RN,  [name], [place] Primary Care Tessa Seaberry: [name] Other Clinician: Referring Marlise Fahr: [name] Treating Chavis Tessler/Extender: Ricard Dillon Weeks in Treatment: 1 Vital Signs Height(in): 71 Pulse(bpm): 68 Weight(lbs): 338 Blood Pressure 145/77 (mmHg): Body Mass Index(BMI): 47 Temperature(F): 98.2 Respiratory Rate 17 (breaths/min): Photos: [1:No Photos] [N/A:N/A] Wound Location: [1:Left Lower Leg - Lateral] [N/A:N/A] Wounding Event: [1:Gradually Appeared] [N/A:N/A] Primary Etiology: [1:Venous Leg Ulcer] [N/A:N/A] Comorbid History: [1:Sleep Apnea, Hypertension, Colitis] [N/A:N/A] Date Acquired: [1:11/18/2015] [N/A:N/A] Weeks of Treatment: [1:1] [N/A:N/A] Wound Status: [1:Open] [N/A:N/A] Measurements L x W x D 2.2x2.5x0.8 [N/A:N/A] (cm) Area (cm) : [1:4.32] [N/A:N/A] Volume (cm) : [1:3.456] [N/A:N/A] % Reduction in Area: [1:12.00%] [  N/A:N/A] % Reduction in Volume: 12.00% [N/A:N/A] Classification: [1:Full Thickness Without Exposed Support Structures] [N/A:N/A] Exudate Amount: [1:Medium] [N/A:N/A] Exudate Type: [1:Serosanguineous] [N/A:N/A] Exudate Color: [1:red, brown] [N/A:N/A] Wound Margin: [1:Distinct, outline attached] [N/A:N/A] Granulation Amount: [1:None Present (0%)] [N/A:N/A] Necrotic Amount: [1:Large (67-100%)] [N/A:N/A] Exposed Structures: [1:Fat Layer (Subcutaneous Tissue) Exposed: Yes Fascia: No Tendon: No Muscle: No] [N/A:N/A] Joint: No Bone: No Epithelialization: None N/A N/A Debridement: Debridement ([id number]- N/A N/A 11047) Pre-procedure 08:53 N/A N/A Verification/Time Out Taken: Pain Control: Lidocaine 4% Topical N/A N/A Solution Tissue Debrided: Necrotic/Eschar, N/A N/A Fibrin/Slough, Exudates, Subcutaneous Level: Skin/Subcutaneous N/A N/A Tissue Debridement Area (sq 5.5 N/A N/A cm): Instrument: Curette N/A N/A Bleeding: Moderate N/A N/A Hemostasis Achieved: Pressure N/A N/A Procedural Pain: 0 N/A N/A Post Procedural Pain: 0 N/A  N/A Debridement Treatment Procedure was tolerated N/A N/A Response: well Post Debridement 2.2x2.5x0.9 N/A N/A Measurements L x W x D (cm) Post Debridement 3.888 N/A N/A Volume: (cm) Periwound Skin Texture: Induration: Yes N/A N/A Excoriation: No Callus: No Crepitus: No Rash: No Scarring: No Periwound Skin Maceration: No N/A N/A Moisture: Dry/Scaly: No Periwound Skin Color: Hemosiderin Staining: Yes N/A N/A Mottled: Yes Atrophie Blanche: No Cyanosis: No Ecchymosis: No Erythema: No Pallor: No Rubor: No Temperature: No Abnormality N/A N/A Tenderness on Yes N/A N/A Palpation: Wound Preparation: Ulcer Cleansing: N/A N/A Rinsed/Irrigated with [name], [name] ([id number]) Saline Topical Anesthetic Applied: Other: lidocaine 4% Procedures Performed: Debridement N/A N/A Treatment Notes Wound #1 (Left, Lateral Lower Leg) 1. Cleansed with: Cleanse wound with antibacterial soap and water 3. Peri-wound Care: Barrier cream 4. Dressing Applied: Aquacel Ag 5. Secondary Dressing Applied ABD Pad Dry Gauze 7. Secured with 3 Layer Compression System - Left Lower Extremity Electronic Signature(s) Signed: 12/11/2016 6:25:53 PM By: Linton Ham MD Entered By: Linton Ham on 12/11/2016 10:13:26 [name] ([id number]) -------------------------------------------------------------------------------- Multi-Disciplinary Care Plan Details Patient Name: [name] Date of Service: 12/11/2016 8:15 AM Medical Record Number: [id number] Patient Account Number: [id number] Date of Birth/Sex: December 19, 1980 (35 y.o. Male) Treating RN: Baruch Gouty, RN, [name], [place] Primary Care Karrissa Parchment: [name] Other Clinician: Referring Khush Pasion: [name] Treating Jacquis Paxton/Extender: Tito Dine in Treatment: 1 Active Inactive ` Orientation to the Wound Care Program Nursing Diagnoses: Knowledge deficit related to the wound healing center program Goals: Patient/caregiver will  verbalize understanding of the [facility] Program Date Initiated: 12/11/2016 Target Resolution Date: 03/13/2017 Goal Status: Active Interventions: Provide education on orientation to the wound center Notes: ` Venous Leg Ulcer Nursing Diagnoses: Knowledge deficit related to disease process and management Potential for venous Insuffiency (use before diagnosis confirmed) Goals: Non-invasive venous studies are completed as ordered Date Initiated: 12/11/2016 Target Resolution Date: 03/13/2017 Goal Status: Active Patient will maintain optimal edema control Date Initiated: 12/11/2016 Target Resolution Date: 03/13/2017 Goal Status: Active Patient/caregiver will verbalize understanding of disease process and disease management Date Initiated: 12/11/2016 Target Resolution Date: 03/13/2017 Goal Status: Active Verify adequate tissue perfusion prior to therapeutic compression application Date Initiated: 12/11/2016 Target Resolution Date: 03/13/2017 Goal Status: Active Husband, Aniken ([id number]) Interventions: Assess peripheral edema status every visit. Compression as ordered Provide education on venous insufficiency Treatment Activities: Therapeutic compression applied : 12/11/2016 Notes: ` Wound/Skin Impairment Nursing Diagnoses: Impaired tissue integrity Knowledge deficit related to ulceration/compromised skin integrity Goals: Patient/caregiver will verbalize understanding of skin care regimen Date Initiated: 12/11/2016 Target Resolution Date: 03/13/2017 Goal Status: Active Ulcer/skin breakdown will have a volume reduction of 30% by week 4 Date Initiated: 12/11/2016 Target Resolution Date: 03/13/2017 Goal Status: Active Ulcer/skin breakdown will  have a volume reduction of 50% by week 8 Date Initiated: 12/11/2016 Target Resolution Date: 03/13/2017 Goal Status: Active Ulcer/skin breakdown will have a volume reduction of 80% by week 12 Date Initiated: 12/11/2016 Target  Resolution Date: 03/13/2017 Goal Status: Active Ulcer/skin breakdown will heal within 14 weeks Date Initiated: 12/11/2016 Target Resolution Date: 03/13/2017 Goal Status: Active Interventions: Assess patient/caregiver ability to obtain necessary supplies Assess patient/caregiver ability to perform ulcer/skin care regimen upon admission and as needed Assess ulceration(s) every visit Provide education on ulcer and skin care Treatment Activities: Skin care regimen initiated : 12/11/2016 Topical wound management initiated : 12/11/2016 Notes: [name], [name] ([id number]) Electronic Signature(s) Signed: 12/11/2016 2:24:03 PM By: Regan Lemming [name], RN Entered By: Regan Lemming on 12/11/2016 08:54:02 [name] ([id number]) -------------------------------------------------------------------------------- Pain Assessment Details Patient Name: [name] Date of Service: 12/11/2016 8:15 AM Medical Record Number: [id number] Patient Account Number: [id number] Date of Birth/Sex: January 13, 1981 (35 y.o. Male) Treating RN: Baruch Gouty, RN, [name], [place] Primary Care Theodus Ran: [name] Other Clinician: Referring Darrious Youman: [name] Treating Lukus Binion/Extender: Ricard Dillon Weeks in Treatment: 1 Active Problems Location of Pain Severity and Description of Pain Patient Has Paino No Site Locations With Dressing Change: No Pain Management and Medication Current Pain Management: Electronic Signature(s) Signed: 12/11/2016 2:24:03 PM By: Regan Lemming [name], RN Entered By: Regan Lemming on 12/11/2016 08:21:01 [name] ([id number]) -------------------------------------------------------------------------------- Patient/Caregiver Education Details Patient Name: [name] Date of Service: 12/11/2016 8:15 AM Medical Record Number: [id number] Patient Account Number: [id number] Date of Birth/Gender: 18-Jan-1981 (35 y.o. Male) Treating RN: Baruch Gouty, RN, [name], [place] Primary Care Physician: [name] Other  Clinician: Referring Physician: [name] Treating Physician/Extender: Tito Dine in Treatment: 1 Education Assessment Education Provided To: Patient Education Topics Provided Venous: Methods: Explain/Verbal Responses: State content correctly Welcome To [facility]: Methods: Explain/Verbal Responses: State content correctly Wound/Skin Impairment: Methods: Explain/Verbal Responses: Reinforcements needed, State content correctly Electronic Signature(s) Signed: 12/11/2016 2:24:03 PM By: Regan Lemming [name], RN Entered By: Regan Lemming on 12/11/2016 09:08:54 [name] ([id number]) -------------------------------------------------------------------------------- Wound Assessment Details Patient Name: [name] Date of Service: 12/11/2016 8:15 AM Medical Record Number: [id number] Patient Account Number: [id number] Date of Birth/Sex: August 12, 1980 (35 y.o. Male) Treating RN: Baruch Gouty, RN, [name], [place] Primary Care Elinor Kleine: [name] Other Clinician: Referring Bijou Easler: [name] Treating Sadie Pickar/Extender: Ricard Dillon Weeks in Treatment: 1 Wound Status Wound Number: 1 Primary Etiology: Venous Leg Ulcer Wound Location: Left Lower Leg - Lateral Wound Status: Open Wounding Event: Gradually Appeared Comorbid Sleep Apnea, Hypertension, History: Colitis Date Acquired: 11/18/2015 Weeks Of Treatment: 1 Clustered Wound: No Photos Photo Uploaded By: Regan Lemming on 12/11/2016 13:05:10 Wound Measurements Length: (cm) 2.2 Width: (cm) 2.5 Depth: (cm) 0.8 Area: (cm) 4.32 Volume: (cm) 3.456 % Reduction in Area: 12% % Reduction in Volume: 12% Epithelialization: None Tunneling: No Undermining: No Wound Description Full Thickness Without Exposed Classification: Support Structures Wound Margin: Distinct, outline attached Exudate Medium Amount: [name], [name] ([id number]) Foul Odor After Cleansing: No Slough/Fibrino Yes Exudate Type:  Serosanguineous Exudate Color: red, brown Wound Bed Granulation Amount: None Present (0%) Exposed Structure Necrotic Amount: Large (67-100%) Fascia Exposed: No Necrotic Quality: Adherent Slough Fat Layer (Subcutaneous Tissue) Exposed: Yes Tendon Exposed: No Muscle Exposed: No Joint Exposed: No Bone Exposed: No Periwound Skin Texture Texture Color No Abnormalities Noted: No No Abnormalities Noted: No Callus: No Atrophie Blanche: No Crepitus: No Cyanosis: No Excoriation: No Ecchymosis: No Induration: Yes Erythema: No Rash: No Hemosiderin Staining: Yes Scarring: No Mottled: Yes Pallor: No Moisture  Rubor: No No Abnormalities Noted: No Dry / Scaly: No Temperature / Pain Maceration: No Temperature: No Abnormality Tenderness on Palpation: Yes Wound Preparation Ulcer Cleansing: Rinsed/Irrigated with Saline Topical Anesthetic Applied: Other: lidocaine 4%, Treatment Notes Wound #1 (Left, Lateral Lower Leg) 1. Cleansed with: Cleanse wound with antibacterial soap and water 3. Peri-wound Care: Barrier cream 4. Dressing Applied: Aquacel Ag 5. Secondary Dressing Applied ABD Pad Dry Gauze 7. Secured with 3 Layer Compression System - Left Lower Extremity Electronic Signature(s) Signed: 12/11/2016 2:24:03 PM By: Regan Lemming [name], RN [name], [name] ([id number]) Entered By: Regan Lemming on 12/11/2016 08:24:14 [name] ([id number]) -------------------------------------------------------------------------------- Vitals Details Patient Name: [name] Date of Service: 12/11/2016 8:15 AM Medical Record Number: [id number] Patient Account Number: [id number] Date of Birth/Sex: [birth year] (35 y.o. Male) Treating RN: Baruch Gouty, RN, [name], [place] Primary Care Loranzo Desha: [name] Other Clinician: Referring Hartwell Vandiver: [name] Treating Tremar Wickens/Extender: Tito Dine in Treatment: 1 Vital Signs Time Taken: 08:21 Temperature (F): 98.2 Height (in): 71 Pulse  (bpm): 68 Weight (lbs): 338 Respiratory Rate (breaths/min): 17 Body Mass Index (BMI): 47.1 Blood Pressure (mmHg): 145/77 Reference Range: 80 - 120 mg / dl Electronic Signature(s) Signed: 12/11/2016 2:24:03 PM By: Regan Lemming [name], RN Entered By: Regan Lemming on 12/11/2016 08:22:54

## 2016-12-13 NOTE — Progress Notes (Signed)
[name], Eino ([id number]) Visit Report for 12/11/2016 Chief Complaint Document Details Patient Name: [name], [name] Date of Service: 12/11/2016 8:15 AM Medical Record Number: [id number] Patient Account Number: [id number] Date of Birth/Sex: 06/14/1980 (35 y.o. Male) Treating RN: Clover MealyAfful, RN, [name], [place] Primary Care Provider: Vernona RiegerLARK, KATHERINE Other Clinician: Referring Provider: Vernona RiegerLARK, KATHERINE Treating Provider/Extender: Maxwell CaulOBSON, Danissa Rundle G Weeks in Treatment: 1 Information Obtained from: Patient Chief Complaint 12/04/16; patient is here for review of a wound on his posterior left calf that has been present for about a year Electronic Signature(s) Signed: 12/11/2016 6:25:53 PM By: Baltazar Najjarobson, Isys Tietje MD Entered By: Baltazar Najjarobson, Shermon Bozzi on 12/11/2016 10:18:17 [name], [name] ([id number]) -------------------------------------------------------------------------------- Debridement Details Patient Name: [name], [name] Date of Service: 12/11/2016 8:15 AM Medical Record Number: [id number] Patient Account Number: [id number] Date of Birth/Sex: 08/05/1980 (35 y.o. Male) Treating RN: Clover MealyAfful, RN, [name], Rita Primary Care Provider: Vernona RiegerLARK, KATHERINE Other Clinician: Referring Provider: Vernona RiegerLARK, KATHERINE Treating Provider/Extender: Altamese CarolinaOBSON, Zuzanna Maroney G Weeks in Treatment: 1 Debridement Performed for Wound #1 Left,Lateral Lower Leg Assessment: Performed By: Physician Maxwell CaulOBSON, Antero Derosia G, MD Debridement: Debridement Severity of Tissue Pre Muscle involvement without necrosis Debridement: Pre-procedure Verification/Time Out Yes - 08:53 Taken: Start Time: 08:53 Pain Control: Lidocaine 4% Topical Solution Level: Skin/Subcutaneous Tissue Total Area Debrided (L x 2.2 (cm) x 2.5 (cm) = 5.5 (cm) W): Tissue and other Non-Viable, Eschar, Exudate, Fibrin/Slough, Subcutaneous material debrided: Instrument: Curette Bleeding: Moderate Hemostasis Achieved: Pressure End Time: 08:55 Procedural Pain: 0 Post Procedural Pain:  0 Response to Treatment: Procedure was tolerated well Post Debridement Measurements of Total Wound Length: (cm) 2.2 Width: (cm) 2.5 Depth: (cm) 0.9 Volume: (cm) 3.888 Character of Wound/Ulcer Post Requires Further Debridement Debridement: Severity of Tissue Post Debridement: Fat layer exposed Post Procedure Diagnosis Same as Pre-procedure Electronic Signature(s) Signed: 12/11/2016 2:24:03 PM By: Elpidio EricAfful, Rita [name], RN Signed: 12/11/2016 6:25:53 PM By: Baltazar Najjarobson, Jaculin Rasmus MD TracyLAMER, [name] MaduroOBERT ([id number]) Entered By: Baltazar Najjarobson, Karlei Waldo on 12/11/2016 10:17:08 [name], [name] ([id number]) -------------------------------------------------------------------------------- HPI Details Patient Name: [name], [name] Date of Service: 12/11/2016 8:15 AM Medical Record Number: [id number] Patient Account Number: [id number] Date of Birth/Sex: 03/23/1981 (35 y.o. Male) Treating RN: Clover MealyAfful, RN, [name], Rita Primary Care Provider: Vernona RiegerLARK, KATHERINE Other Clinician: Referring Provider: Vernona RiegerLARK, KATHERINE Treating Provider/Extender: Maxwell CaulOBSON, Sharena Dibenedetto G Weeks in Treatment: 1 History of Present Illness HPI Description: 12/04/16; 36 year old man who comes into the clinic today for review of a wound on the posterior left calf. He tells me that is been there for about a year. He is not a diabetic he does smoke half a pack per day. He was seen in the ER on 11/20/16 felt to have cellulitis around the wound and was given clindamycin. An x-ray did not show osteomyelitis. The patient initially tells me that he has a milk allergy that sets off a pruritic itching rash on his lower legs which she scratches incessantly and he thinks that's what may have set up the wound. He has been using various topical antibiotics and ointments without any effect. He works in a [employer]ing [employer] and is on his feet all day. He does not have a prior history of wounds however he does have the rash on both lower legs the right arm and the ventral aspect of his left  arm. These are excoriations and clearly have had scratching however there are of macular looking areas on both legs including a substantial larger area on the right leg. This does not have an underlying open area. There is no blistering. The patient tells me that 2 years ago in  [place] in response to the rash on his legs he saw a dermatologist who told him he had a condition which may be pyoderma gangrenosum although I may be putting words into his mouth. He seemed to recognize this. 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 He is not in any treatment for this. He's had no recent travel 12/11/16; the patient arrives today with his wound and roughly the same condition we've been using silver alginate this is a deep punched out wound with some surrounding erythema but no tenderness. Biopsy I did did not show confirmed pyoderma gangrenosum suggested nonspecific inflammation and vasculitis but does not provide an actual description of what was seen by the pathologist. I'm really not able to understand this We have also received information from the patient's dermatologist in [place] notes from April 2016. This was a doctor Agarwal-antal. 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 He was prescribed topical steroid high potency under occlusion which helped but at this point the patient did not have a deep punched out wound. Electronic Signature(s) Signed: 12/11/2016 6:25:53 PM By: Baltazar Najjar MD Entered By: Baltazar Najjar on 12/11/2016 10:21:39 [name] ([id number]) -------------------------------------------------------------------------------- Physical Exam Details Patient Name: [name] Date of Service: 12/11/2016 8:15 AM Medical Record Number: [id number] Patient Account Number: [id number] Date of Birth/Sex: 08-Nov-1980 (35 y.o. Male) Treating RN: Clover Mealy, RN, [name], [place] Primary Care Provider: [name] Other  Clinician: Referring Provider: [name] Treating Provider/Extender: Maxwell Caul Weeks in Treatment: 1 Constitutional Patient is hypertensive.. Pulse regular and within target range for patient.Marland Kitchen Respirations regular, non-labored and within target range.. Temperature is normal and within the target range for the patient.Marland Kitchen appears in no distress. Notes When exam; he has a same ragged circular wound on the left posterior leg. He again tells me that this has been here for a year. Very necrotic surface. Using a #5 curet debridement of necrotic subcutaneous tissue. There is no real evidence of infection here. Mostly medially and superiorly to the wound there is erythema but no tenderness. He does not have overt stasis physiology. Multiple excoriations especially on the right leg Electronic Signature(s) Signed: 12/11/2016 6:25:53 PM By: Baltazar Najjar MD Entered By: Baltazar Najjar on 12/11/2016 10:22:52 [name] ([id number]) -------------------------------------------------------------------------------- Physician Orders Details Patient Name: [name] Date of Service: 12/11/2016 8:15 AM Medical Record Number: [id number] Patient Account Number: [id number] Date of Birth/Sex: 05-05-1981 (35 y.o. Male) Treating RN: Clover Mealy, RN, [name], [place] Primary Care Provider: [name] Other Clinician: Referring Provider: [name] Treating Provider/Extender: Altamese [place] in Treatment: 1 Verbal / Phone Orders: No Diagnosis Coding Wound Cleansing Wound #1 Left,Lateral Lower Leg o May shower with protection. - Keep dressing from getting wet. Cover with cast protector or large trash bag o No tub bath. Anesthetic Wound #1 Left,Lateral Lower Leg o Topical Lidocaine 4% cream applied to wound bed prior to debridement Skin Barriers/Peri-Wound Care Wound #1 Left,Lateral Lower Leg o Barrier cream o Triamcinolone Acetonide Ointment Primary Wound  Dressing Wound #1 Left,Lateral Lower Leg o Aquacel Ag Secondary Dressing Wound #1 Left,Lateral Lower Leg o ABD pad Dressing Change Frequency Wound #1 Left,Lateral Lower Leg o Change dressing every week - to be changed in the clinic Follow-up Appointments Wound #1 Left,Lateral Lower Leg o Return Appointment in 1 week. Edema Control Wound #1 Left,Lateral Lower Leg o Elevate legs to the level of the heart and pump ankles as often as possible Missildine, Jaquarius ([id number]) Additional Orders /  Instructions Wound #1 Left,Lateral Lower Leg o Increase protein intake. o Activity as tolerated Consults o Dermatology Electronic Signature(s) Signed: 12/11/2016 2:24:03 PM By: Elpidio Eric [name], RN Signed: 12/11/2016 6:25:53 PM By: Baltazar Najjar MD Entered By: Elpidio Eric on 12/11/2016 09:09:15 [name] ([id number]) -------------------------------------------------------------------------------- Problem List Details Patient Name: [name] Date of Service: 12/11/2016 8:15 AM Medical Record Number: [id number] Patient Account Number: [id number] Date of Birth/Sex: 17-Aug-1980 (35 y.o. 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 Male) Treating RN: Clover Mealy, RN, [name], Rita Primary Care Provider: [name] Other Clinician: Referring Provider: [name] Treating Provider/Extender: Maxwell Caul Weeks in Treatment: 1 Subjective Chief Complaint Information obtained from Patient 12/04/16; patient is here for review of a wound on his posterior left calf that has been present for about a year History of Present Illness (HPI) 12/04/16; 36 year old man who comes into the clinic today for review of a wound on the posterior left calf. He tells me that is been there for about a year. He is not a diabetic he does smoke half a pack per day. He was seen in the ER on 11/20/16 felt to have cellulitis around the wound and was given clindamycin. An x-ray did not show osteomyelitis. The patient initially tells me that he has a milk allergy that sets off a pruritic itching rash on his lower legs which she scratches incessantly and he thinks that's what may have set up the wound. He has been using various topical antibiotics and ointments without any effect. He works in a [employer]ing [employer] and is on his feet all day. He does not have a prior history of wounds however he does have the rash on both lower legs the right arm and the ventral aspect of his left arm. These are excoriations and clearly have had scratching however there are of macular looking areas on both legs including a substantial larger area on the right leg. This does not have an underlying open area. There is no blistering. 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 He seemed to recognize this. On further questioning he admits to a 5 year history of quiesced.  ulcerative colitis. He is not in any treatment for this. He's had no recent travel 12/11/16; the patient arrives today with his wound and roughly the same condition we've been using silver alginate this is a deep punched out wound with some surrounding erythema but no tenderness. Biopsy I did did not show confirmed pyoderma gangrenosum suggested nonspecific inflammation and vasculitis but does not provide an actual description of what was seen by the pathologist. I'm really not able to understand this We have also received information [place]. This was a doctor Agarwal-antal. 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 He was prescribed topical steroid high potency under occlusion which helped but at this point the patient did not have a deep punched out wound. [name], [name] ([id number]) Objective Constitutional Patient is hypertensive.. Pulse regular and within target range for patient.Marland Kitchen. Respirations regular, non-labored and within target range.. Temperature is normal and within the target range for the patient.Marland Kitchen. appears in no distress. Vitals Time Taken: 8:21 AM, Height: 71 in, Weight: 338 lbs, BMI: 47.1, Temperature: 98.2 F, Pulse: 68 bpm, Respiratory Rate: 17 breaths/min, Blood Pressure: 145/77 mmHg. General Notes: When exam; he has a same ragged circular wound on the left posterior leg. He again tells me that this has been here for a year. Very necrotic surface. Using a #5 curet debridement of necrotic subcutaneous tissue. There is no real evidence of infection here. Mostly medially and superiorly to the wound there is erythema but no tenderness. He does not have overt stasis physiology. Multiple excoriations especially on the right leg Integumentary (Hair, Skin) Wound #1 status is Open. Original cause of wound was Gradually Appeared. The wound is located on the Left,Lateral Lower Leg. The wound measures 2.2cm length  x 2.5cm width x 0.8cm depth; 4.32cm^2 area and 3.456cm^3 volume. There is Fat Layer (Subcutaneous Tissue) Exposed exposed. There is no tunneling or undermining noted. There is a medium amount of serosanguineous drainage noted. The wound margin is distinct with the outline attached to the wound base. There is no granulation within the wound bed. There is a large (67-100%) amount of necrotic tissue within the wound bed including Adherent Slough. The periwound skin appearance exhibited: Induration, Hemosiderin Staining, Mottled. The periwound skin appearance did not exhibit: Callus, Crepitus, Excoriation, Rash, Scarring, Dry/Scaly, Maceration, Atrophie Blanche, Cyanosis, Ecchymosis, Pallor, Rubor, Erythema. Periwound temperature was noted as No Abnormality. The periwound has tenderness on palpation. 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 There was a Skin/Subcutaneous Tissue Debridement ([id number]) debridement with total area of 5.5 sq cm performed by Maxwell CaulOBSON, Girl Schissler G, MD. with the following instrument(s): Curette to remove Non-Viable tissue/material including Exudate, Fibrin/Slough, Eschar, and Subcutaneous after achieving pain control using Lidocaine 4% Topical Solution. A time out was conducted at 08:53, prior to the start of the procedure. A Moderate amount of bleeding was controlled with Pressure. The procedure was tolerated well with a pain level of 0 throughout and a pain level of 0 following the procedure. Post Debridement Measurements: 2.2cm length x 2.5cm width x 0.9cm depth; 3.888cm^3 volume. Character of Wound/Ulcer  Post Debridement requires further debridement. Severity of Tissue Post Debridement is: Fat layer exposed. Post procedure Diagnosis Wound #1: Same as Pre-Procedure Plan Wound Cleansing: Wound #1 Left,Lateral Lower Leg: May shower with protection. - Keep dressing from getting wet. Cover with cast protector or large trash bag No tub bath. Anesthetic: Wound #1 Left,Lateral Lower Leg: Topical Lidocaine 4% cream applied to wound bed prior to debridement Skin Barriers/Peri-Wound Care: Wound #1 Left,Lateral Lower Leg: Barrier cream Triamcinolone Acetonide Ointment Primary Wound Dressing: Wound #1 Left,Lateral Lower Leg: Aquacel Ag Secondary Dressing: Wound #1 Left,Lateral Lower Leg: ABD pad Dressing Change Frequency: Wound #1 Left,Lateral  Lower Leg: Change dressing every week - to be changed in the clinic Follow-up Appointments: Wound #1 Left,Lateral Lower Leg: Return Appointment in 1 week. Edema Control: Wound #1 Left,Lateral Lower Leg: Elevate legs to the level of the heart and pump ankles as often as possible Camus, Philipp ([id number]) Additional Orders / Instructions: Wound #1 Left,Lateral Lower Leg: Increase protein intake. Activity as tolerated Consults ordered were: Dermatology #1 the patient wound look about the same. I continued silver alginate for now although I may change to santyl orIodoflex #2 the patient does not have a clinical history of pyoderma gangrenosum and I think this was simply him responding to something I said. #3 I find the pathology report not very helpful mention of vasculitis without a pathologic description #4 in 2016 the patient had lipids and simplex chronicus and this certainly would account for the itchy excoriated lesions mostly on his right leg and upper arm. #5 we'll solicit dermatology consultation #6 consider reflux studies on the left leg and this could be venous inflammation with venous ulcer although the condition of the leg is less  convincing. May have early stage lymphedema Electronic Signature(s) Signed: 12/11/2016 6:25:53 PM By: Baltazar Najjar MD Entered By: Baltazar Najjar on 12/11/2016 10:24:51 [name] ([id number]) -------------------------------------------------------------------------------- SuperBill Details Patient Name: [name] Date of Service: 12/11/2016 Medical Record Number: [id number] Patient Account Number: [id number] Date of Birth/Sex: 1980/07/21 (35 y.o. Male) Treating RN: Clover Mealy, RN, [name], [place] Primary Care Provider: [name] Other Clinician: Referring Provider: [name] Treating Provider/Extender: Maxwell Caul Weeks in Treatment: 1 Diagnosis Coding ICD-10 Codes Code Description [phone] 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sis of muscle I87.2 Venous insufficiency (chronic) (peripheral) L88 Pyoderma gangrenosum Facility Procedures CPT4 Code: [id number] Description: 11042 - DEB SUBQ TISSUE 20 SQ CM/< ICD-10 Description Diagnosis L97.223 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 I87.2 Venous insufficiency (chronic) (peripheral) Modifier: sis of muscle Quantity: 1 Physician Procedures CPT4 Code: [id number] Description: 11042 - WC PHYS SUBQ TISS 20 SQ CM ICD-10 Description Diagnosis L97.223 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 I87.2 Venous insufficiency (chronic) (peripheral) Modifier: sis of muscle Quantity: 1 Electronic Signature(s) Signed: 12/11/2016 6:25:53 PM By: Baltazar Najjar MD Entered By: Baltazar Najjar on 12/11/2016 10:25:10

## 2016-12-17 ENCOUNTER — Ambulatory Visit: Payer: 59

## 2016-12-18 ENCOUNTER — Encounter: Payer: 59 | Attending: Internal Medicine | Admitting: Internal Medicine

## 2016-12-18 DIAGNOSIS — L97223 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sis of muscle: Secondary | ICD-10-CM | POA: Diagnosis not present

## 2016-12-18 DIAGNOSIS — F17210 Nicotine dependence, cigarettes, uncomplicated: Secondary | ICD-10-CM | POA: Diagnosis not present

## 2016-12-18 DIAGNOSIS — L88 Pyoderma gangrenosum: Secondary | ICD-10-CM | POA: Diagnosis not present

## 2016-12-18 DIAGNOSIS — S81802A Unspecified open wound, left lower leg, initial encounter: Secondary | ICD-10-CM | POA: Diagnosis not present

## 2016-12-18 DIAGNOSIS — I872 Venous insufficiency (chronic) (peripheral): Secondary | ICD-10-CM | POA: Insufficient documentation

## 2016-12-20 NOTE — Progress Notes (Signed)
MASAKI, ROTHBAUER ([id number]) Visit Report for 12/18/2016 HPI Details Patient Name: [name], [name] Date of Service: 12/18/2016 3:45 PM Medical Record Number: [id number] Patient Account Number: [id number] Date of Birth/Sex: 19-Feb-1981 (35 y.o. Male) Treating RN: Clover Mealy, RN, [name], [place] Primary Care Provider: [name] Other Clinician: Referring Provider: [name] Treating Provider/Extender: Maxwell Caul Weeks in Treatment: 2 History of Present Illness HPI Description: 12/04/16; 36 year old man who comes into the clinic today for review of a wound on the posterior left calf. He tells me that is been there for about a year. He is not a diabetic he does smoke half a pack per day. He was seen in the ER on 11/20/16 felt to have cellulitis around the wound and was given clindamycin. An x-ray did not show osteomyelitis. The patient initially tells me that he has a milk allergy that sets off a pruritic itching rash on his lower legs which she scratches incessantly and he thinks that's what may have set up the wound. He has been using various topical antibiotics and ointments without any effect. He works in a [employer]ing [employer] and is on his feet all day. He does not have a prior history of wounds however he does have the rash on both lower legs the right arm and the ventral aspect of his left arm. These are excoriations and clearly have had scratching however there are of macular looking areas on both legs including a substantial larger area on the right leg. This does not have an underlying open area. There is no blistering. 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 He seemed to recognize this. 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 He is not in any treatment for this. He's had  no recent travel 12/11/16; the patient arrives today with his wound and roughly the same condition we've been using silver alginate this is a deep punched out wound with some surrounding erythema but no tenderness. Biopsy I did did not show confirmed pyoderma gangrenosum suggested nonspecific inflammation and vasculitis but does not provide an actual description of what was seen by the pathologist. I'm really not able to understand this We have also received information from the patient's dermatologist in [place] notes from April 2016. This was a doctor Agarwal-antal. 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 He was prescribed topical steroid high potency under occlusion which helped but at this point the patient did not have a deep punched out wound. 12/18/16; the patient's wound is larger in terms of surface area however this surface looks better and there is less depth. The surrounding erythema also is better. The patient states that the wrap we put on came off 2 days ago when he has been using his compression stockings. He we are in the process of getting a dermatology consult. Electronic Signature(s) Signed: 12/19/2016 3:50:17 PM By: Baltazar Najjar MD Marianna, [name] ([id number]) Entered By: Baltazar Najjar on 12/18/2016 16:26:10 VALERIE, FREDIN ([id number]) -------------------------------------------------------------------------------- Physical Exam Details Patient Name: [name] Date of Service: 12/18/2016 3:45 PM Medical Record Number: [id number] Patient Account Number: [id number] Date of Birth/Sex: November 07, 1980 (35 y.o. Male) Treating RN: Clover Mealy, RN, [name], [place] [employer] Primary Care Provider: [name] Other Clinician: Referring Provider: [name] Treating Provider/Extender: Maxwell Caul Weeks in Treatment: 2  Constitutional Sitting or standing Blood Pressure is within target range for patient.. Pulse regular and within target range for patient.Marland Kitchen. Respirations  regular, non-labored and within target range.. Temperature is normal and within the target range for the patient.Marland Kitchen. appears in no distress. Eyes Conjunctivae clear. No discharge. Respiratory Respiratory effort is easy and symmetric bilaterally. Rate is normal at rest and on room air.. Cardiovascular Pedal pulses palpable and strong bilaterally.. Edema control is still better in the left lower extremity. Lymphatic None palpable in the popliteal or inguinal area. Psychiatric No evidence of depression, anxiety, or agitation. Calm, cooperative, and communicative. Appropriate interactions and affect.. Notes Wound exam; the wound is on the left posterior lateral leg. Surface looks better. No debridement is required. Surface area larger but there is definitely less depth. The surrounding erythema around this is also better. Electronic Signature(s) Signed: 12/19/2016 3:50:17 PM By: Baltazar Najjarobson, Leven Hoel MD Entered By: Baltazar Najjarobson, Dameion Briles on 12/18/2016 16:27:51 [name], [name] ([id number]) -------------------------------------------------------------------------------- Physician Orders Details Patient Name: [name], [name] Date of Service: 12/18/2016 3:45 PM Medical Record Number: [id number] Patient Account Number: [id number] Date of Birth/Sex: 12/21/1980 (35 y.o. Male) Treating RN: Clover MealyAfful, RN, [name], [place] Primary Care Provider: Vernona RiegerLARK, KATHERINE Other Clinician: Referring Provider: Vernona RiegerLARK, KATHERINE Treating Provider/Extender: Altamese CarolinaOBSON, Maquita Sandoval G Weeks in Treatment: 2 Verbal / Phone Orders: No Diagnosis Coding Wound Cleansing Wound #1 Left,Lateral Lower Leg o May shower with protection. - Keep dressing from getting wet. Cover with cast protector or large trash bag o No tub bath. Anesthetic Wound #1 Left,Lateral Lower Leg o Topical Lidocaine 4% cream applied to wound bed prior to debridement Skin Barriers/Peri-Wound Care Wound #1 Left,Lateral Lower Leg o Barrier cream o Triamcinolone Acetonide  Ointment Primary Wound Dressing Wound #1 Left,Lateral Lower Leg o Hydrafera Blue Secondary Dressing Wound #1 Left,Lateral Lower Leg o ABD pad Dressing Change Frequency Wound #1 Left,Lateral Lower Leg o Change dressing every week - to be changed in the clinic Follow-up Appointments Wound #1 Left,Lateral Lower Leg o Return Appointment in 1 week. Edema Control Wound #1 Left,Lateral Lower Leg o 3 Layer Compression System - Left Lower Extremity o Elevate legs to the level of the heart and pump ankles as often as possible Glogowski, Naveen ([id number]) Additional Orders / Instructions Wound #1 Left,Lateral Lower Leg o Increase protein intake. o Activity as tolerated Electronic Signature(s) Signed: 12/18/2016 4:44:32 PM By: Elpidio EricAfful, Rita [name], RN Signed: 12/19/2016 3:50:17 PM By: Baltazar Najjarobson, Tieasha Larsen MD Entered By: Elpidio EricAfful, Rita on 12/18/2016 16:04:11 [name], [name] ([id number]) -------------------------------------------------------------------------------- Problem List Details Patient Name: [name], [name] Date of Service: 12/18/2016 3:45 PM Medical Record Number: [id number] Patient Account Number: [id number] Date of Birth/Sex: 10/12/1980 (35 y.o. 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 I87.2 Venous insufficiency (chronic) (peripheral) 12/04/2016 Yes Inactive Problems Resolved Problems Electronic Signature(s) Signed: 12/19/2016 3:50:17 PM By: Baltazar Najjarobson, Karene Bracken MD Entered By: Baltazar Najjarobson, Saramarie Stinger on 12/18/2016 16:24:15 [name], [name] ([id number]) -------------------------------------------------------------------------------- Progress Note Details Patient Name: [name],  [name] Date of Service: 12/18/2016 3:45 PM Medical Record Number: [id number] Patient Account Number: [id number] Date of Birth/Sex: 02/25/1981 (35 y.o. Male) Treating RN: Clover MealyAfful, RN, [name], Rita Primary Care Provider: Vernona RiegerLARK, KATHERINE Other Clinician: Referring Provider: Vernona RiegerLARK, KATHERINE Treating Provider/Extender: Maxwell CaulOBSON, Kejon Feild G Weeks in Treatment: 2 Subjective History of Present Illness (HPI) 12/04/16; 36 year old man who comes into  the clinic today for review of a wound on the posterior left calf. He tells me that is been there for about a year. He is not a diabetic he does smoke half a pack per day. He was seen in the ER on 11/20/16 felt to have cellulitis around the wound and was given clindamycin. An x-ray did not show osteomyelitis. The patient initially tells me that he has a milk allergy that sets off a pruritic itching rash on his lower legs which she scratches incessantly and he thinks that's what may have set up the wound. He has been using various topical antibiotics and ointments without any effect. He works in a [employer]ing [employer] and is on his feet all day. He does not have a prior history of wounds however he does have the rash on both lower legs the right arm and the ventral aspect of his left arm. These are excoriations and clearly have had scratching however there are of macular looking areas on both legs including a substantial larger area on the right leg. This does not have an underlying open area. There is no blistering. 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 He seemed to recognize this. 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 He is not in any treatment for this. He's had no recent travel 12/11/16; the patient arrives today with his wound and roughly the same  condition we've been using silver alginate this is a deep punched out wound with some surrounding erythema but no tenderness. Biopsy I did did not show confirmed pyoderma gangrenosum suggested nonspecific inflammation and vasculitis but does not provide an actual description of what was seen by the pathologist. I'm really not able to understand this We have also received information from the patient's dermatologist in [place] notes from April 2016. This was a doctor Agarwal-antal. 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 He was prescribed topical steroid high potency under occlusion which helped but at this point the patient did not have a deep punched out wound. 12/18/16; the patient's wound is larger in terms of surface area however this surface looks better and there is less depth. The surrounding erythema also is better. The patient states that the wrap we put on came off 2 days ago when he has been using his compression stockings. He we are in the process of getting a dermatology consult. [name], [name] ([id number]) Objective Constitutional Sitting or standing Blood Pressure is within target range for patient.. Pulse regular and within target range for patient.Marland Kitchen Respirations regular, non-labored and within target range.. Temperature is normal and within the target range for the patient.Marland Kitchen appears in no distress. Vitals Time Taken: 3:51 PM, Height: 71 in, Weight: 338 lbs, BMI: 47.1, Temperature: 97.8 F, Pulse: 70 bpm, Respiratory Rate: 17 breaths/min, Blood Pressure: 143/76 mmHg. Eyes Conjunctivae clear. No discharge. Respiratory Respiratory effort is easy and symmetric bilaterally. Rate is normal at rest and on room air.. Cardiovascular Pedal pulses palpable and strong bilaterally.. Edema control is still better in the left lower extremity. Lymphatic None palpable in the popliteal or inguinal area. Psychiatric No evidence of depression, anxiety, or agitation. Calm,  cooperative, and communicative. Appropriate interactions and affect.. General Notes: Wound exam; the wound is on the left posterior lateral  leg. Surface looks better. No debridement is required. Surface area larger but there is definitely less depth. The surrounding erythema around this is also better. Integumentary (Hair, Skin) Wound #1 status is Open. Original cause of wound was Gradually Appeared. The wound is located on the Left,Lateral Lower Leg. The wound measures 3.3cm length x 2.5cm width x 0.8cm depth; 6.48cm^2 area and 5.184cm^3 volume. 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 - Keep dressing from getting wet. Cover with cast protector or large trash bag No tub bath. Anesthetic: Wound #1 Left,Lateral Lower Leg: Topical Lidocaine 4% cream applied to wound bed prior to debridement Skin Barriers/Peri-Wound Care: Wound #1 Left,Lateral Lower Leg: Barrier cream Triamcinolone Acetonide Ointment Primary Wound Dressing: Wound #1 Left,Lateral Lower Leg: Hydrafera Blue Secondary Dressing: Wound #1 Left,Lateral Lower Leg: ABD pad Dressing Change Frequency: Wound #1 Left,Lateral Lower Leg: Change dressing every week - to be changed in the clinic Follow-up Appointments: Wound #1 Left,Lateral Lower Leg: Return Appointment in 1 week. Edema Control: Wound #1 Left,Lateral Lower Leg: 3 Layer Compression System - Left Lower Extremity Elevate legs to the level of the heart and pump ankles as often as possible Additional Orders / Instructions: Wound #1 Left,Lateral Lower Leg: Increase protein intake. Activity as tolerated #1 change primary dressing [facility] with underlying TCA #2 ABDs [name], Dabney ([id number]) Number [id number] impression Electronic  Signature(s) Signed: 12/19/2016 3:50:17 PM By: Baltazar Najjarobson, Na Waldrip MD Entered By: Baltazar Najjarobson, Lea Walbert on 12/18/2016 16:29:01 [name], Celvin ([id number]) -------------------------------------------------------------------------------- SuperBill Details Patient Name: [name], Rehman Date of Service: 12/18/2016 Medical Record Number: [id number] Patient Account Number: [id number] Date of Birth/Sex: 03/26/1981 (35 y.o. Male) Treating RN: Clover MealyAfful, RN, [name], [place] Primary Care Provider: Vernona RiegerLARK, KATHERINE Other Clinician: Referring Provider: Vernona RiegerLARK, KATHERINE Treating Provider/Extender: Maxwell CaulOBSON, Nahzir Pohle G Weeks in Treatment: 2 Diagnosis Coding ICD-10 Codes Code Description [phone] 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sis of muscle I87.2 Venous insufficiency (chronic) (peripheral) L88 Pyoderma gangrenosum Facility Procedures CPT4: Description Modifier Quantity Code [phone] (Facility Use Only) [phone] - APPLY MULTLAY COMPRS LWR LT 1 LEG Physician Procedures CPT4 Code: [id number] Description: 99213 - WC PHYS LEVEL 3 - EST PT ICD-10 Description Diagnosis L97.223 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 Modifier: sis of muscle Quantity: 1 Electronic Signature(s) Signed: 12/19/2016 3:50:17 PM By: Baltazar Najjarobson, Khilee Hendricksen MD Entered By: Baltazar Najjarobson, Kaitlyne Friedhoff on 12/18/2016 60:10:9316:29:22

## 2016-12-23 NOTE — Progress Notes (Signed)
ANTWONE, CAPOZZOLI ([id number]) Visit Report for 12/18/2016 Arrival Information Details Patient Name: [name], [name] Date of Service: 12/18/2016 3:45 PM Medical Record Number: [id number] Patient Account Number: [id number] Date of Birth/Sex: 1981/01/14 (35 y.o. Male) Treating RN: Baruch Gouty, RN, [name], [place] Primary Care Eadie Repetto: [name] Other Clinician: Referring Afton Lavalle: [name] Treating Chavonne Sforza/Extender: Tito Dine in Treatment: 2 Visit Information History Since Last Visit All ordered tests and consults were completed: No Patient Arrived: Ambulatory Added or deleted any medications: No Arrival Time: 15:45 Any new allergies or adverse reactions: No Accompanied By: dtr Had a fall or experienced change in No Transfer Assistance: None activities of daily living that may affect Patient Identification Verified: Yes risk of falls: Secondary Verification Process Yes Signs or symptoms of abuse/neglect since last No Completed: visito Patient Requires Transmission-Based No Hospitalized since last visit: No Precautions: Has Dressing in Place as Prescribed: Yes Patient Has Alerts: No Pain Present Now: No Electronic Signature(s) Signed: 12/18/2016 4:44:32 PM By: Regan Lemming [name], RN Entered By: Regan Lemming on 12/18/2016 15:46:18 [name] ([id number]) -------------------------------------------------------------------------------- Encounter Discharge Information Details Patient Name: [name] Date of Service: 12/18/2016 3:45 PM Medical Record Number: [id number] Patient Account Number: [id number] Date of Birth/Sex: 20-Oct-1980 (35 y.o. Male) Treating RN: Baruch Gouty, RN, [name], [place] Primary Care Miroslava Santellan: [name] Other Clinician: Referring Helton Oleson: [name] Treating Kelise Kuch/Extender: Tito Dine in Treatment: 2 Encounter Discharge Information Items Discharge Pain Level: 0 Discharge Condition: Stable Ambulatory Status: Ambulatory Discharge  Destination: Home Transportation: Private Auto Accompanied By: wife Schedule Follow-up Appointment: No Medication Reconciliation completed and provided to Patient/Care No Zunaira Lamy: Provided on Clinical Summary of Care: 12/18/2016 Form Type Recipient Paper Patient RL Electronic Signature(s) Signed: 12/18/2016 4:20:23 PM By: Ruthine Dose Entered By: Ruthine Dose on 12/18/2016 16:20:23 [name] ([id number]) -------------------------------------------------------------------------------- Lower Extremity Assessment Details Patient Name: [name] Date of Service: 12/18/2016 3:45 PM Medical Record Number: [id number] Patient Account Number: [id number] Date of Birth/Sex: 03-12-1981 (35 y.o. Male) Treating RN: Baruch Gouty, RN, [name], [place] Primary Care Naoma Boxell: [name] Other Clinician: Referring Maleak Brazzel: [name] Treating Ara Mano/Extender: Ricard Dillon Weeks in Treatment: 2 Edema Assessment Assessed: [Left: No] [Right: No] E[Left: dema] [Right: :] Calf Left: Right: Point of Measurement: 33 cm From Medial Instep cm cm Vascular Assessment Claudication: Claudication Assessment [Left:None] Pulses: Dorsalis Pedis Palpable: [Left:Yes] Posterior Tibial Extremity colors, hair growth, and conditions: Extremity Color: [Left:Mottled] Hair Growth on Extremity: [Left:Yes] Temperature of Extremity: [Left:Warm] Capillary Refill: [Left:< 3 seconds] Toe Nail Assessment Left: Right: Thick: No Discolored: No Deformed: No Improper Length and Hygiene: No Electronic Signature(s) Signed: 12/18/2016 4:44:32 PM By: Regan Lemming [name], RN Entered By: Regan Lemming on 12/18/2016 15:50:27 [name] ([id number]) -------------------------------------------------------------------------------- Multi Wound Chart Details Patient Name: [name] Date of Service: 12/18/2016 3:45 PM Medical Record Number: [id number] Patient Account Number: [id number] Date of Birth/Sex: 1981-02-01 (35 y.o.  Male) Treating RN: Baruch Gouty, RN, [name], [place] Primary Care Hayla Hinger: [name] Other Clinician: Referring Cassandra Harbold: [name] Treating Armin Yerger/Extender: Ricard Dillon Weeks in Treatment: 2 Vital Signs Height(in): 71 Pulse(bpm): 70 Weight(lbs): 338 Blood Pressure 143/76 (mmHg): Body Mass Index(BMI): 47 Temperature(F): 97.8 Respiratory Rate 17 (breaths/min): Photos: [1:No Photos] [N/A:N/A] Wound Location: [1:Left, Lateral Lower Leg] [N/A:N/A] Wounding Event: [1:Gradually Appeared] [N/A:N/A] Primary Etiology: [1:Venous Leg Ulcer] [N/A:N/A] Date Acquired: [1:11/18/2015] [N/A:N/A] Weeks of Treatment: [1:2] [N/A:N/A] Wound Status: [1:Open] [N/A:N/A] Measurements L x W x D 3.3x2.5x0.8 [N/A:N/A] (cm) Area (cm) : [1:6.48] [N/A:N/A] Volume (cm) : [1:5.184] [N/A:N/A] % Reduction in Area: [1:-32.00%] [N/A:N/A] %  Reduction in Volume: -32.00% [N/A:N/A] Classification: [1:Full Thickness Without Exposed Support Structures] [N/A:N/A] Periwound Skin Texture: No Abnormalities Noted [N/A:N/A] Periwound Skin [1:No Abnormalities Noted] [N/A:N/A] Moisture: Periwound Skin Color: No Abnormalities Noted [N/A:N/A] Tenderness on [1:No] [N/A:N/A] Treatment Notes Wound #1 (Left, Lateral Lower Leg) 1. Cleansed with: Cleanse wound with antibacterial soap and water 3. Peri-wound Care: Barrier cream [name], [name] ([id number]) 4. Dressing Applied: Hydrafera Blue 5. Secondary Dressing Applied ABD Pad Dry Gauze 7. Secured with 3 Layer Compression System - Left Lower Extremity Electronic Signature(s) Signed: 12/19/2016 3:50:17 PM By: Linton Ham MD Entered By: Linton Ham on 12/18/2016 16:24:26 [name] ([id number]) -------------------------------------------------------------------------------- Multi-Disciplinary Care Plan Details Patient Name: [name] Date of Service: 12/18/2016 3:45 PM Medical Record Number: [id number] Patient Account Number: [id number] Date of  Birth/Sex: [birth year] (35 y.o. Male) Treating RN: Baruch Gouty, RN, [name], [place] Primary Care Briley Sulton: [name] Other Clinician: Referring Dauntae Derusha: [name] Treating Padraic Marinos/Extender: Tito Dine in Treatment: 2 Active Inactive ` Orientation to the Wound Care Program Nursing Diagnoses: Knowledge deficit related to the wound healing center program Goals: Patient/caregiver will verbalize understanding of the [facility] Date Initiated: 12/11/2016 Target Resolution Date: 03/13/2017 Goal Status: Active Interventions: Provide education on orientation to the wound center Notes: ` Venous Leg Ulcer Nursing Diagnoses: Knowledge deficit related to disease process and management Potential for venous Insuffiency (use before diagnosis confirmed) Goals: Non-invasive venous studies are completed as ordered Date Initiated: 12/11/2016 Target Resolution Date: 03/13/2017 Goal Status: Active Patient will maintain optimal edema control Date Initiated: 12/11/2016 Target Resolution Date: 03/13/2017 Goal Status: Active Patient/caregiver will verbalize understanding of disease process and disease management Date Initiated: 12/11/2016 Target Resolution Date: 03/13/2017 Goal Status: Active Verify adequate tissue perfusion prior to therapeutic compression application Date Initiated: 12/11/2016 Target Resolution Date: 03/13/2017 Goal Status: Active [name], [name] ([id number]) Interventions: Assess peripheral edema status every visit. Compression as ordered Provide education on venous insufficiency Treatment Activities: Therapeutic compression applied : 12/11/2016 Notes: ` Wound/Skin Impairment Nursing Diagnoses: Impaired tissue integrity Knowledge deficit related to ulceration/compromised skin integrity Goals: Patient/caregiver will verbalize understanding of skin care regimen Date Initiated: 12/11/2016 Target Resolution Date: 03/13/2017 Goal Status:  Active Ulcer/skin breakdown will have a volume reduction of 30% by week 4 Date Initiated: 12/11/2016 Target Resolution Date: 03/13/2017 Goal Status: Active Ulcer/skin breakdown will have a volume reduction of 50% by week 8 Date Initiated: 12/11/2016 Target Resolution Date: 03/13/2017 Goal Status: Active Ulcer/skin breakdown will have a volume reduction of 80% by week 12 Date Initiated: 12/11/2016 Target Resolution Date: 03/13/2017 Goal Status: Active Ulcer/skin breakdown will heal within 14 weeks Date Initiated: 12/11/2016 Target Resolution Date: 03/13/2017 Goal Status: Active Interventions: Assess patient/caregiver ability to obtain necessary supplies Assess patient/caregiver ability to perform ulcer/skin care regimen upon admission and as needed Assess ulceration(s) every visit Provide education on ulcer and skin care Treatment Activities: Skin care regimen initiated : 12/11/2016 Topical wound management initiated : 12/11/2016 Notes: [name], [name] ([id number]) Electronic Signature(s) Signed: 12/18/2016 4:44:32 PM By: Regan Lemming [name], RN Entered By: Regan Lemming on 12/18/2016 16:02:04 [name] ([id number]) -------------------------------------------------------------------------------- Pain Assessment Details Patient Name: [name] Date of Service: 12/18/2016 3:45 PM Medical Record Number: [id number] Patient Account Number: [id number] Date of Birth/Sex: 1980/11/29 (35 y.o. Male) Treating RN: Baruch Gouty, RN, [name], [place] Primary Care Jamyia Fortune: [name] Other Clinician: Referring Davy Faught: [name] Treating Hannan Hutmacher/Extender: Ricard Dillon Weeks in Treatment: 2 Active Problems Location of Pain Severity and Description of Pain Patient Has Paino No Site Locations With Dressing Change:  No Pain Management and Medication Current Pain Management: Electronic Signature(s) Signed: 12/18/2016 4:44:32 PM By: Regan Lemming [name], RN Entered By: Regan Lemming on 12/18/2016  15:46:27 [name] ([id number]) -------------------------------------------------------------------------------- Patient/Caregiver Education Details Patient Name: [name] Date of Service: 12/18/2016 3:45 PM Medical Record Number: [id number] Patient Account Number: [id number] Date of Birth/Gender: 11/28/1980 (35 y.o. Male) Treating RN: Baruch Gouty, RN, [name], [place] Har[place] Primary Care Physician: [name] Other Clinician: Referring Physician: [name] Treating Physician/Extender: Tito Dine in Treatment: 2 Education Assessment Education Provided To: Patient Education Topics Provided Venous: Methods: Explain/Verbal Responses: State content correctly Welcome To [facility]: Methods: Explain/Verbal Responses: State content correctly Wound/Skin Impairment: Methods: Explain/Verbal Responses: Reinforcements needed, State content correctly Electronic Signature(s) Signed: 12/18/2016 4:44:32 PM By: Regan Lemming [name], RN Entered By: Regan Lemming on 12/18/2016 16:18:42 [name] ([id number]) -------------------------------------------------------------------------------- Wound Assessment Details Patient Name: [name] Date of Service: 12/18/2016 3:45 PM Medical Record Number: [id number] Patient Account Number: [id number] Date of Birth/Sex: 10-19-1980 (35 y.o. Male) Treating RN: Baruch Gouty, RN, [name], [place] Primary Care Chestine Belknap: [name] Other Clinician: Referring Jeter Tomey: [name] Treating Polo Mcmartin/Extender: Ricard Dillon Weeks in Treatment: 2 Wound Status Wound Number: 1 Primary Etiology: Venous Leg Ulcer Wound Location: Left, Lateral Lower Leg Wound Status: Open Wounding Event: Gradually Appeared Date Acquired: 11/18/2015 Weeks Of Treatment: 2 Clustered Wound: No Photos Photo Uploaded By: Regan Lemming on 12/18/2016 16:42:18 Wound Measurements Length: (cm) 3.3 Width: (cm) 2.5 Depth: (cm) 0.8 Area: (cm) 6.48 Volume: (cm) 5.184 %  Reduction in Area: -32% % Reduction in Volume: -32% Wound Description Full Thickness Without Exposed Classification: Support Structures Periwound Skin Texture Texture Color [name], [name] ([id number]) No Abnormalities Noted: No No Abnormalities Noted: No Moisture No Abnormalities Noted: No Treatment Notes Wound #1 (Left, Lateral Lower Leg) 1. Cleansed with: Cleanse wound with antibacterial soap and water 3. Peri-wound Care: Barrier cream 4. Dressing Applied: Hydrafera Blue 5. Secondary Dressing Applied ABD Pad Dry Gauze 7. Secured with 3 Layer Compression System - Left Lower Extremity Electronic Signature(s) Signed: 12/18/2016 4:44:32 PM By: Regan Lemming [name], RN Entered By: Regan Lemming on 12/18/2016 15:48:49 [name] ([id number]) -------------------------------------------------------------------------------- Vitals Details Patient Name: [name] Date of Service: 12/18/2016 3:45 PM Medical Record Number: [id number] Patient Account Number: [id number] Date of Birth/Sex: 11-29-1980 (35 y.o. Male) Treating RN: Afful, RN, [name], [place] Primary Care Eann Cleland: [name] Other Clinician: Referring Ryliee Figge: [name] Treating Abdulkadir Emmanuel/Extender: Tito Dine in Treatment: 2 Vital Signs Time Taken: 15:51 Temperature (F): 97.8 Height (in): 71 Pulse (bpm): 70 Weight (lbs): 338 Respiratory Rate (breaths/min): 17 Body Mass Index (BMI): 47.1 Blood Pressure (mmHg): 143/76 Reference Range: 80 - 120 mg / dl Electronic Signature(s) Signed: 12/18/2016 4:44:32 PM By: Regan Lemming [name], RN Entered By: Regan Lemming on 12/18/2016 15:52:07

## 2016-12-26 ENCOUNTER — Encounter: Payer: 59 | Admitting: Physician Assistant

## 2016-12-26 ENCOUNTER — Other Ambulatory Visit
Admission: RE | Admit: 2016-12-26 | Discharge: 2016-12-26 | Disposition: A | Discharge status: Home / Self Care | Payer: 59 | Source: Ambulatory Visit | Attending: Physician Assistant | Admitting: Physician Assistant

## 2016-12-26 DIAGNOSIS — L089 Local infection of the skin and subcutaneous tissue, unspecified: Secondary | ICD-10-CM | POA: Diagnosis not present

## 2016-12-26 DIAGNOSIS — B965 Pseudomonas (aeruginosa) (mallei) (pseudomallei) as the cause of diseases classified elsewhere: Secondary | ICD-10-CM | POA: Insufficient documentation

## 2016-12-26 DIAGNOSIS — L539 Erythematous condition, unspecified: Secondary | ICD-10-CM | POA: Diagnosis not present

## 2016-12-26 DIAGNOSIS — X58XXXA Exposure to other specified factors, initial encounter: Secondary | ICD-10-CM | POA: Insufficient documentation

## 2016-12-26 DIAGNOSIS — S81802A Unspecified open wound, left lower leg, initial encounter: Secondary | ICD-10-CM | POA: Diagnosis not present

## 2016-12-26 DIAGNOSIS — L97223 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sis of muscle: Secondary | ICD-10-CM | POA: Diagnosis not present

## 2016-12-26 DIAGNOSIS — S80922A Unspecified superficial injury of left lower leg, initial encounter: Secondary | ICD-10-CM | POA: Insufficient documentation

## 2016-12-28 NOTE — Progress Notes (Signed)
[name] ([id number]) Visit Report for 12/26/2016 Arrival Information Details Patient Name: [name] Date of Service: 12/26/2016 8:15 AM Medical Record Number: [id number] Patient Account Number: [id number] Date of Birth/Sex: 1981-03-03 (36 y.o. Male) Treating RN: [name] Primary Care [name]: [name] Other Clinician: Referring [name]: [name] Treating Khyson Sebesta/Extender: Melburn Hake, [name]: 3 Visit Information History Since Last Visit Added or deleted any medications: No Patient Arrived: Ambulatory Any new allergies or adverse reactions: No Arrival Time: 08:23 Had a fall or experienced change in No Accompanied By: self activities of daily living that may affect Transfer Assistance: None risk of falls: Patient Identification Verified: Yes Signs or symptoms of abuse/neglect since last No Secondary Verification Process Yes visito Completed: Hospitalized since last visit: No Patient Requires Transmission-Based No Has Dressing in Place as Prescribed: No Precautions: Has Compression in Place as Prescribed: No Patient Has Alerts: No Pain Present Now: Yes Electronic Signature(s) Signed: 12/26/2016 11:10:26 AM By: [name], [name], RN, [name], [name], [name] Entered By: [name], [name], RN, [name], [name] on 12/26/2016 08:23:47 [name] ([id number]) -------------------------------------------------------------------------------- Clinic Level of Care Assessment Details Patient Name: [name] Date of Service: 12/26/2016 8:15 AM Medical Record Number: [id number] Patient Account Number: [id number] Date of Birth/Sex: March 23, 1981 (36 y.o. Male) Treating RN: [name] Primary Care Miley Lindon: [name] Other Clinician: Referring Jaylynn Mcaleer: [name] Treating Aqeel Norgaard/Extender: Melburn Hake, [name]: 3 Clinic Level of Care Assessment Items TOOL 3 Quantity Score []  - Use when EandM and Procedure is performed on FOLLOW-UP visit  0 ASSESSMENTS - Nursing Assessment / Reassessment []  - Reassessment of Co-morbidities (includes updates in patient status) 0 X - Reassessment of Adherence to Treatment Plan 1 5 ASSESSMENTS - Wound and Skin Assessment / Reassessment []  - Points for Wound Assessment can only be taken for a new wound of unknown 0 or different etiology and a procedure is NOT performed to that wound []  - Simple Wound Assessment / Reassessment - one wound 0 []  - Complex Wound Assessment / Reassessment - multiple wounds 0 []  - Dermatologic / Skin Assessment (not related to wound area) 0 ASSESSMENTS - Focused Assessment []  - Circumferential Edema Measurements - multi extremities 0 []  - Nutritional Assessment / Counseling / Intervention 0 []  - Lower Extremity Assessment (monofilament, tuning fork, pulses) 0 []  - Peripheral Arterial Disease Assessment (using hand held doppler) 0 ASSESSMENTS - Ostomy and/or Continence Assessment and Care []  - Incontinence Assessment and Management 0 []  - Ostomy Care Assessment and Management (repouching, etc.) 0 PROCESS - Coordination of Care []  - Points for Discharge Coordination can only be taken for a new wound of 0 unknown or different etiology and a procedure is NOT performed to that wound []  - Simple Patient / Family Education for ongoing care 0 []  - Complex (extensive) Patient / Family Education for ongoing care 0 [name], [name] ([id number]) []  - Staff obtains Consents, Records, Test Results / Process Orders 0 []  - Staff telephones HHA, Nursing Homes / Clarify orders / etc 0 []  - Routine Transfer to another Facility (non-emergent condition) 0 []  - Routine Hospital Admission (non-emergent condition) 0 []  - New Admissions / Biomedical engineer / Ordering NPWT, Apligraf, etc. 0 []  - Emergency Hospital Admission (emergent condition) 0 []  - Simple Discharge Coordination 0 []  - Complex (extensive) Discharge Coordination 0 PROCESS - Special Needs []  - Pediatric / Minor  Patient Management 0 []  - Isolation Patient Management 0 []  - Hearing / Language / Visual special needs 0 []  - Assessment  of Community assistance (transportation, D/C planning, etc.) 0 []  - Additional assistance / Altered mentation 0 []  - Support Surface(s) Assessment (bed, cushion, seat, etc.) 0 INTERVENTIONS - Wound Cleansing / Measurement []  - Points for Wound Cleaning / Measurement, Wound Dressing, Specimen 0 Collection and Specimen taken to lab can only be taken for a new wound of unknown or different etiology and a procedure is NOT performed to that wound []  - Simple Wound Cleansing - one wound 0 []  - Complex Wound Cleansing - multiple wounds 0 []  - Wound Imaging (photographs - any number of wounds) 0 []  - Wound Tracing (instead of photographs) 0 []  - Simple Wound Measurement - one wound 0 []  - Complex Wound Measurement - multiple wounds 0 INTERVENTIONS - Wound Dressings []  - Small Wound Dressing one or multiple wounds 0 [name], [name] ([id number]) []  - Medium Wound Dressing one or multiple wounds 0 []  - Large Wound Dressing one or multiple wounds 0 INTERVENTIONS - Miscellaneous []  - External ear exam 0 X - Specimen Collection (cultures, biopsies, blood, body fluids, etc.) 1 5 X - Specimen(s) / Culture(s) sent or taken to Lab for analysis 1 5 []  - Patient Transfer (multiple staff / Civil Service fast streamer / Similar devices) 0 []  - Simple Staple / Suture removal (25 or less) 0 []  - Complex Staple / Suture removal (26 or more) 0 []  - Hypo / Hyperglycemic Management (close monitor of Blood Glucose) 0 []  - Ankle / Brachial Index (ABI) - do not check if billed separately 0 X - Vital Signs 1 5 Has the patient been seen at the hospital within the last three years: Yes Total Score: 20 Level Of Care: New/Established - Level 1 Electronic Signature(s) Signed: 12/26/2016 11:10:26 AM By: [name], [name], RN, [name], [name], [name] Entered By: [name], [name], RN, [name], [name] on 12/26/2016 09:07:39 [name]  ([id number]) -------------------------------------------------------------------------------- Encounter Discharge Information Details Patient Name: [name] Date of Service: 12/26/2016 8:15 AM Medical Record Number: [id number] Patient Account Number: [id number] Date of Birth/Sex: Nov 29, 1980 (36 y.o. Male) Treating RN: [name] Primary Care Jalon Blackwelder: [name] Other Clinician: Referring Calib Wadhwa: [name] Treating Nataki Mccrumb/Extender: Melburn Hake, [name]: 3 Encounter Discharge Information Items Discharge Pain Level: 2 Discharge Condition: Stable Ambulatory Status: Ambulatory Discharge Destination: Home Transportation: Private Auto Accompanied By: self Schedule Follow-up Appointment: Yes Medication Reconciliation completed Yes and provided to Patient/Care Marcellis Frampton: Patient Clinical Summary of Care: Declined Electronic Signature(s) Signed: 12/26/2016 11:10:26 AM By: [name], [name], RN, [name], [name], [name] Entered By: [name], [name], RN, [name], [name] on 12/26/2016 09:08:49 [name] ([id number]) -------------------------------------------------------------------------------- Lower Extremity Assessment Details Patient Name: [name] Date of Service: 12/26/2016 8:15 AM Medical Record Number: [id number] Patient Account Number: [id number] Date of Birth/Sex: 1980/09/01 (36 y.o. Male) Treating RN: [name] Primary Care Weber Monnier: [name] Other Clinician: Referring Sheikh Leverich: [name] Treating Kalesha Irving/Extender: Melburn Hake, [name]: 3 Edema Assessment Assessed: [Left: No] [Right: No] Edema: [Left: Ye] [Right: s] Calf Left: Right: Point of Measurement: 33 cm From Medial Instep 52 cm cm Ankle Left: Right: Point of Measurement: 9 cm From Medial Instep 30 cm cm Vascular Assessment Pulses: Dorsalis Pedis Palpable: [Left:Yes] Posterior Tibial Extremity colors, hair growth, and conditions: Extremity Color: [Left:Red] Hair Growth  on Extremity: [Left:Yes] Temperature of Extremity: [Left:Warm] Capillary Refill: [Left:< 3 seconds] Dependent Rubor: [Left:No] Blanched when Elevated: [Left:No] Lipodermatosclerosis: [Left:No] Toe Nail Assessment Left: Right: Thick: Yes Discolored: No Deformed: No Improper Length and Hygiene: No Electronic Signature(s) Signed: 12/26/2016 11:10:26 AM  By: [name], [name], RN, [name], [name], [name] [place], [place] ([id number]) Entered By: [name], [name], RN, [name], [name] on 12/26/2016 08:35:11 [name], [name] ([id number]) -------------------------------------------------------------------------------- Multi Wound Chart Details Patient Name: [name] Date of Service: 12/26/2016 8:15 AM Medical Record Number: [id number] Patient Account Number: [id number] Date of Birth/Sex: Feb 15, 1981 (36 y.o. Male) Treating RN: [name] Primary Care Airen Dales: [name] Other Clinician: Referring Samie Barclift: [name] Treating Lamichael Youkhana/Extender: Melburn Hake, [name]: 3 Vital Signs Height(in): 71 Pulse(bpm): 77 Weight(lbs): 338 Blood Pressure 127/79 (mmHg): Body Mass Index(BMI): 47 Temperature(F): 98.2 Respiratory Rate 16 (breaths/min): Photos: [N/A:N/A] Wound Location: Left Lower Leg - Lateral N/A N/A Wounding Event: Gradually Appeared N/A N/A Primary Etiology: Venous Leg Ulcer N/A N/A Comorbid History: Sleep Apnea, N/A N/A Hypertension, Colitis Date Acquired: 11/18/2015 N/A N/A Weeks of Treatment: 3 N/A N/A Wound Status: Open N/A N/A Measurements L x W x D 4.2x3.5x0.5 N/A N/A (cm) Area (cm) : 11.545 N/A N/A Volume (cm) : 5.773 N/A N/A % Reduction in Area: -135.20% N/A N/A % Reduction in Volume: -47.00% N/A N/A Classification: Full Thickness Without N/A N/A Exposed Support Structures Exudate Amount: Large N/A N/A Exudate Type: Serosanguineous N/A N/A Exudate Color: red, brown N/A N/A Wound Margin: Flat and Intact N/A N/A Granulation Amount: Small (1-33%) N/A N/A Necrotic  Amount: Large (67-100%) N/A N/A Exposed Structures: N/A N/A [name], [name] ([id number]) Fat Layer (Subcutaneous Tissue) Exposed: Yes Fascia: No Tendon: No Muscle: No Joint: No Bone: No Epithelialization: None N/A N/A Periwound Skin Texture: Excoriation: Yes N/A N/A Induration: No Callus: No Crepitus: No Rash: No Scarring: No Periwound Skin Maceration: No N/A N/A Moisture: Dry/Scaly: No Periwound Skin Color: Rubor: Yes N/A N/A Atrophie Blanche: No Cyanosis: No Ecchymosis: No Erythema: No Hemosiderin Staining: No Mottled: No Pallor: No Temperature: Hot N/A N/A Tenderness on No N/A N/A Palpation: Wound Preparation: Ulcer Cleansing: N/A N/A Rinsed/Irrigated with Saline Topical Anesthetic Applied: Other: Lidocaine 4% Treatment Notes Electronic Signature(s) Signed: 12/26/2016 11:10:26 AM By: [name], [name], RN, [name], [name], [name] Entered By: [name], [name], RN, [name], [name] on 12/26/2016 08:44:03 [name] ([id number]) -------------------------------------------------------------------------------- Multi-Disciplinary Care Plan Details Patient Name: [name] Date of Service: 12/26/2016 8:15 AM Medical Record Number: [id number] Patient Account Number: [id number] Date of Birth/Sex: 03/13/1981 (36 y.o. Male) Treating RN: [name] Primary Care Jylian Pappalardo: [name] Other Clinician: Referring Hope Holst: [name] Treating Zion Ta/Extender: Melburn Hake, [name]: 3 Active Inactive ` Orientation to the Wound Care Program Nursing Diagnoses: Knowledge deficit related to the wound healing center program Goals: Patient/caregiver will verbalize understanding of the [place] Program Date Initiated: 12/11/2016 Target Resolution Date: 03/13/2017 Goal Status: Active Interventions: Provide education on orientation to the wound center Notes: ` Venous Leg Ulcer Nursing Diagnoses: Knowledge deficit related to disease process and management Potential  for venous Insuffiency (use before diagnosis confirmed) Goals: Non-invasive venous studies are completed as ordered Date Initiated: 12/11/2016 Target Resolution Date: 03/13/2017 Goal Status: Active Patient will maintain optimal edema control Date Initiated: 12/11/2016 Target Resolution Date: 03/13/2017 Goal Status: Active Patient/caregiver will verbalize understanding of disease process and disease management Date Initiated: 12/11/2016 Target Resolution Date: 03/13/2017 Goal Status: Active Verify adequate tissue perfusion prior to therapeutic compression application Date Initiated: 12/11/2016 Target Resolution Date: 03/13/2017 Goal Status: Active [name], [name] ([id number]) Interventions: Assess peripheral edema status every visit. Compression as ordered Provide education on venous insufficiency Treatment Activities: Therapeutic compression applied : 12/11/2016 Notes: ` Wound/Skin Impairment Nursing Diagnoses: Impaired tissue integrity Knowledge deficit related to ulceration/compromised skin  integrity Goals: Patient/caregiver will verbalize understanding of skin care regimen Date Initiated: 12/11/2016 Target Resolution Date: 03/13/2017 Goal Status: Active Ulcer/skin breakdown will have a volume reduction of 30% by week 4 Date Initiated: 12/11/2016 Target Resolution Date: 03/13/2017 Goal Status: Active Ulcer/skin breakdown will have a volume reduction of 50% by week 8 Date Initiated: 12/11/2016 Target Resolution Date: 03/13/2017 Goal Status: Active Ulcer/skin breakdown will have a volume reduction of 80% by week 12 Date Initiated: 12/11/2016 Target Resolution Date: 03/13/2017 Goal Status: Active Ulcer/skin breakdown will heal within 14 weeks Date Initiated: 12/11/2016 Target Resolution Date: 03/13/2017 Goal Status: Active Interventions: Assess patient/caregiver ability to obtain necessary supplies Assess patient/caregiver ability to perform ulcer/skin care regimen upon  admission and as needed Assess ulceration(s) every visit Provide education on ulcer and skin care Treatment Activities: Skin care regimen initiated : 12/11/2016 Topical wound management initiated : 12/11/2016 Notes: [name], [name] ([id number]) Electronic Signature(s) Signed: 12/26/2016 11:10:26 AM By: [name], [name], RN, [name], [name], [name] Entered By: [name], [name], RN, [name], [name] on 12/26/2016 08:36:50 [name] ([id number]) -------------------------------------------------------------------------------- Pain Assessment Details Patient Name: [name] Date of Service: 12/26/2016 8:15 AM Medical Record Number: [id number] Patient Account Number: [id number] Date of Birth/Sex: September 17, 1980 (36 y.o. Male) Treating RN: [name] Primary Care Chamara Dyck: [name] Other Clinician: Referring Felisha Claytor: [name] Treating Saniah Schroeter/Extender: Melburn Hake, [name]: 3 Active Problems Location of Pain Severity and Description of Pain Patient Has Paino Yes Site Locations Pain Location: Generalized Pain With Dressing Change: Yes Duration of the Pain. Constant / Intermittento Intermittent Rate the pain. Current Pain Level: 3 Worst Pain Level: 7 Character of Pain Describe the Pain: Burning, Sharp, Stabbing Pain Management and Medication Current Pain Management: Goals for Pain Management Topical or injectable lidocaine is offered to patient for acute pain when surgical debridement is performed. If needed, Patient is instructed to use over the counter pain medication for the following 24-48 hours after debridement. Wound care MDs do not prescribed pain medications. Patient has chronic pain or uncontrolled pain. Patient has been instructed to make an appointment with their Primary Care Physician for pain management. Electronic Signature(s) Signed: 12/26/2016 11:10:26 AM By: [name], [name], RN, [name], [name], [name] Entered By: [name], [name], RN, [name], [name] on 12/26/2016 08:24:49 [name]  ([id number]) -------------------------------------------------------------------------------- Patient/Caregiver Education Details Patient Name: [name] Date of Service: 12/26/2016 8:15 AM Medical Record Number: [id number] Patient Account Number: [id number] Date of Birth/Gender: 26-Mar-1981 (36 y.o. Male) Treating RN: [name] Primary Care Physician: [name] Other Clinician: Referring Physician: [name] Treating Physician/Extender: Sharalyn Ink in Treatment: 3 Education Assessment Education Provided To: Patient Education Topics Provided Wound/Skin Impairment: Handouts: Caring for Your Ulcer, Other: daily wound care as prescribed Methods: Demonstration Responses: State content correctly Electronic Signature(s) Signed: 12/26/2016 11:10:26 AM By: [name], [name], RN, [name], [name], [name] Entered By: [name], [name], RN, [name], [name] on 12/26/2016 09:09:15 [name] ([id number]) -------------------------------------------------------------------------------- Wound Assessment Details Patient Name: [name] Date of Service: 12/26/2016 8:15 AM Medical Record Number: [id number] Patient Account Number: [id number] Date of Birth/Sex: [birth year] (36 y.o. Male) Treating RN: [name] Primary Care Saja Bartolini: [name] Other Clinician: Referring Kymani Shimabukuro: [name] Treating Shabana Armentrout/Extender: Melburn Hake, [name]: 3 Wound Status Wound Number: 1 Primary Etiology: Venous Leg Ulcer Wound Location: Left Lower Leg - Lateral Wound Status: Open Wounding Event: Gradually Appeared Comorbid Sleep Apnea, Hypertension, History: Colitis Date Acquired: 11/18/2015 Weeks Of Treatment: 3 Clustered Wound: No Photos Wound Measurements Length: (cm) 4.2 Width: (cm)  3.5 Depth: (cm) 0.5 Area: (cm) 11.545 Volume: (cm) 5.773 % Reduction in Area: -135.2% % Reduction in Volume: -47% Epithelialization: None Tunneling: No Undermining: No Wound Description Full  Thickness Without Exposed Foul Odor After Classification: Support Structures Slough/Fibrino Wound Margin: Flat and Intact Exudate Large Amount: Exudate Type: Serosanguineous Exudate Color: red, brown Cleansing: No Yes Wound Bed Granulation Amount: Small (1-33%) Exposed Structure Necrotic Amount: Large (67-100%) Fascia Exposed: No Necrotic Quality: Adherent Slough Fat Layer (Subcutaneous Tissue) Exposed: Yes Tendon Exposed: No Muscle Exposed: No Joint Exposed: No [name], [name] ([id number]) Bone Exposed: No Periwound Skin Texture Texture Color No Abnormalities Noted: No No Abnormalities Noted: No Callus: No Atrophie Blanche: No Crepitus: No Cyanosis: No Excoriation: Yes Ecchymosis: No Induration: No Erythema: No Rash: No Hemosiderin Staining: No Scarring: No Mottled: No Pallor: No Moisture Rubor: Yes No Abnormalities Noted: No Dry / Scaly: No Temperature / Pain Maceration: No Temperature: Hot Wound Preparation Ulcer Cleansing: Rinsed/Irrigated with Saline Topical Anesthetic Applied: Other: Lidocaine 4%, Treatment Notes Wound #1 (Left, Lateral Lower Leg) 1. Cleansed with: Clean wound with Normal Saline 2. Anesthetic Topical Lidocaine 4% cream to wound bed prior to debridement 4. Dressing Applied: Santyl Ointment 5. Secondary Dressing Applied ABD Pad 7. Secured with Other (specify in notes) Notes conform and Physiological scientist) Signed: 12/26/2016 11:10:26 AM By: [name], [name], RN, [name], [name], [name] Entered By: [name], [name], RN, [name], [name] on 12/26/2016 08:33:11 [name] ([id number]) -------------------------------------------------------------------------------- [place] Details Patient Name: [name] Date of Service: 12/26/2016 8:15 AM Medical Record Number: [id number] Patient Account Number: [id number] Date of Birth/Sex: May 03, 1981 (36 y.o. Male) Treating RN: [name] Primary Care Bertran Zeimet: [name] Other Clinician: Referring  Fancy Dunkley: [name] Treating Kacyn Souder/Extender: Melburn Hake, [name]: 3 Vital Signs Time Taken: 08:24 Temperature (F): 98.2 Height (in): 71 Pulse (bpm): 77 Weight (lbs): 338 Respiratory Rate (breaths/min): 16 Body Mass Index (BMI): 47.1 Blood Pressure (mmHg): 127/79 Reference Range: 80 - 120 mg / dl Electronic Signature(s) Signed: 12/26/2016 11:10:26 AM By: [name], [name], RN, [name], [name], [name] Entered By: [name], [name], RN, [name], [name] on 12/26/2016 08:25:12

## 2016-12-28 NOTE — Progress Notes (Signed)
[name], [name] ([id number]) Visit Report for 12/26/2016 Chief Complaint Document Details Patient Name: [name], [name] Date of Service: 12/26/2016 8:15 AM Medical Record Number: [id number] Patient Account Number: [id number] Date of Birth/Sex: 22-Oct-1980 (35 y.o. Male) Treating RN: [name] Primary Care Provider: [name] Other Clinician: Referring Provider: [name] Treating Provider/Extender: Melburn Hake, Harm Jou Weeks in Treatment: 3 Information Obtained from: Patient Chief Complaint 12/04/16; patient is here for review of a wound on his posterior left calf Electronic Signature(s) Signed: 12/27/2016 10:18:42 AM By: Worthy Keeler PA-C Entered By: Worthy Keeler on 12/27/2016 08:23:29 [name], [name] ([id number]) -------------------------------------------------------------------------------- HPI Details Patient Name: [name] Date of Service: 12/26/2016 8:15 AM Medical Record Number: [id number] Patient Account Number: [id number] Date of Birth/Sex: [birth year] (35 y.o. Male) Treating RN: [name] Primary Care Provider: [name] Other Clinician: Referring Provider: [name] Treating Provider/Extender: Melburn Hake, Danaly Bari Weeks in Treatment: 3 History of Present Illness HPI Description: 12/04/16; 36 year old man who comes into the clinic today for review of a wound on the posterior left calf. He tells me that is been there for about a year. He is not a diabetic he does smoke half a pack per day. He was seen in the ER on 11/20/16 felt to have cellulitis around the wound and was given clindamycin. An x-ray did not show osteomyelitis. The patient initially tells me that he has a milk allergy that sets off a pruritic itching rash on his lower legs which she scratches incessantly and he thinks that's what may have set up the wound. He has been using various topical antibiotics and ointments without any effect. He works in a [employer]ing [employer] and is on his feet all day. He does  not have a prior history of wounds however he does have the rash on both lower legs the right arm and the ventral aspect of his left arm. These are excoriations and clearly have had scratching however there are of macular looking areas on both legs including a substantial larger area on the right leg. This does not have an underlying open area. There is no blistering. The patient tells me that 2 years ago in [place] in response to the rash on his legs he saw a dermatologist who told him he had a condition which may be pyoderma gangrenosum although I may be putting words into his mouth. He seemed to recognize this. 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 He is not in any treatment for this. He's had no recent travel 12/11/16; the patient arrives today with his wound and roughly the same condition we've been using silver alginate this is a deep punched out wound with some surrounding erythema but no tenderness. Biopsy I did did not show confirmed pyoderma gangrenosum suggested nonspecific inflammation and vasculitis but does not provide an actual description of what was seen by the pathologist. I'm really not able to understand this We have also received information from the patient's dermatologist in [place] notes from April 2016. This was a doctor Agarwal-antal. 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 He was prescribed topical steroid high potency under occlusion which helped but at this point the patient did not have a deep punched out wound. 12/18/16; the patient's wound is larger in terms of surface area however this surface looks better and there is less depth. The surrounding erythema also is better. The patient states that the wrap we put on came off 2 days ago when he has  been using his compression stockings. He we are in the process of getting a dermatology consult. 12/26/16 on evaluation today patient's left lower extremity wound  shows evidence of infection with surrounding erythema noted. He has been tolerating the dressing changes but states that he has noted more discomfort. There is a larger area of erythema surrounding the wound. No fevers, chills, nausea, or vomiting noted at this time. With that being said the wound still does have slough covering the surface. He is not allergic to any medication that he is aware of at this point. In regard to his right lower extremity he had several regions that are erythematous and pruritic he wonders if there's anything we can do to help that. AMRAM, MAYA ([id number]) Electronic Signature(s) Signed: 12/27/2016 10:18:42 AM By: Worthy Keeler PA-C Entered By: Worthy Keeler on 12/27/2016 00:92:33 [name], [name] ([id number]) -------------------------------------------------------------------------------- Physical Exam Details Patient Name: [name] Date of Service: 12/26/2016 8:15 AM Medical Record Number: [id number] Patient Account Number: [id number] Date of Birth/Sex: 05-Mar-1981 (35 y.o. Male) Treating RN: [name] Primary Care Provider: [name] Other Clinician: Referring Provider: [name] Treating Provider/Extender: Melburn Hake, Tegan Britain Weeks in Treatment: 3 Constitutional Obese and well-hydrated in no acute distress. Respiratory normal breathing without difficulty. clear to auscultation bilaterally. Cardiovascular regular rate and rhythm with normal S1, S2. Psychiatric this patient is able to make decisions and demonstrates good insight into disease process. Alert and Oriented x 3. pleasant and cooperative. Electronic Signature(s) Signed: 12/27/2016 10:18:42 AM By: Worthy Keeler PA-C Entered By: Worthy Keeler on 12/27/2016 08:27:32 [name] ([id number]) -------------------------------------------------------------------------------- Physician Orders Details Patient Name: [name] Date of Service: 12/26/2016 8:15 AM Medical Record  Number: [id number] Patient Account Number: [id number] Date of Birth/Sex: 12-01-1980 (35 y.o. Male) Treating RN: [name] Primary Care Provider: [name] Other Clinician: Referring Provider: [name] Treating Provider/Extender: Melburn Hake, Lennis Korb Weeks in Treatment: 3 Verbal / Phone Orders: No Diagnosis Coding ICD-10 Coding Code Description [phone] 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sis of muscle I87.2 Venous insufficiency (chronic) (peripheral) Wound Cleansing Wound #1 Left,Lateral Lower Leg o May Shower, gently pat wound dry prior to applying new dressing. o No tub bath. Anesthetic Wound #1 Left,Lateral Lower Leg o Topical Lidocaine 4% cream applied to wound bed prior to debridement - In Clinic Skin Barriers/Peri-Wound Care Wound #1 Left,Lateral Lower Leg o Barrier cream o Triamcinolone Acetonide Ointment Primary Wound Dressing o Santyl Ointment - Medihoney or Manuka Honey if Santyl too expensive Secondary Dressing Wound #1 Left,Lateral Lower Leg o ABD pad Dressing Change Frequency Wound #1 Left,Lateral Lower Leg o Change dressing every day. Follow-up Appointments Wound #1 Left,Lateral Lower Leg o Return Appointment in 1 week. Edema Control Derstine, Kimothy ([id number]) Wound #1 Left,Lateral Lower Leg o Patient to wear own compression stockings o Elevate legs to the level of the heart and pump ankles as often as possible Additional Orders / Instructions Wound #1 Left,Lateral Lower Leg o Increase protein intake. o Activity as tolerated Medications-please add to medication list. Wound #1 Left,Lateral Lower Leg o P.O. Antibiotics - Doxycycline o Topical Antibiotic - TAC o Santyl Enzymatic Ointment - Medihoney or Manuka Honey if Santyl too expensive Patient Medications Allergies: mik, Biaxin, seasonal Notifications Medication Indication Start End Santyl 12/26/2016 DOSE topical 250 unit/gram ointment - ointment  topical applied to the left LE wound nickel thick and then cover with dressing daily doxycycline hyclate 12/26/2016 DOSE 1 - oral 100 mg capsule - 1 capsule oral every 12 hours  x 10 days triamcinolone acetonide 12/26/2016 DOSE topical 0.1 % ointment - ointment topical apply a thin film to the affected areas of the right lower extremity 2 times a day Notes A wound culture was obtained today and will be sent for further evaluation. In the interim I'm gonna go ahead and place him on doxycycline 100 mg per above as well as initiating treatment with Santyl. I'm also going to give him a prescription for triamcinolone to use on his opposite lower extremity where he has several itchy lesions. We will see him for reevaluation in one week to see were things stand at that point. If anything worsened significantly in the meantime he will contact our office for additional recommendations. Electronic Signature(s) Signed: 12/27/2016 10:18:42 AM By: Worthy Keeler PA-C Previous Signature: 12/26/2016 8:58:28 AM Version By: Worthy Keeler PA-C Entered By: Worthy Keeler on 12/27/2016 08:28:52 [name], [name] ([id number]) -------------------------------------------------------------------------------- Problem List Details Patient Name: [name] Date of Service: 12/26/2016 8:15 AM Medical Record Number: [id number] Patient Account Number: [id number] Date of Birth/Sex: 1981-04-20 (35 y.o. 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 Male) Treating RN: [name] Primary Care Provider: [name] Other Clinician: Referring Provider: [name] Treating Provider/Extender: Melburn Hake, Tyjanae Bartek Weeks in Treatment: 3 Subjective Chief Complaint Information obtained from Patient 12/04/16; patient is here for review of a wound on his posterior left calf History of Present Illness (HPI) 12/04/16; 36 year old man who comes into the clinic today for review of a wound on the posterior left calf. He tells me that is been there for about a year. He is not a diabetic he does smoke half a pack per day. He was seen in the ER on 11/20/16 felt to have cellulitis around the wound and was given clindamycin. An x-ray did not show osteomyelitis. The patient initially tells me that he has a milk allergy that sets off a pruritic itching rash on his lower legs which she scratches incessantly and he thinks that's what may have set up the wound. He has been using various topical antibiotics and ointments without any effect. He works in a [employer]ing [employer] and is on his feet all day. He does not have a prior history of wounds however he does have the rash on both lower legs the right arm and the ventral aspect of his left arm. These are excoriations and clearly have had scratching however there are of macular looking areas on both legs including a substantial larger area on the right leg. This does not have an underlying open area. There is no blistering. The patient tells me that 2 years ago in [place] in response to the rash on his legs he saw a dermatologist who told him he had a condition which  may be pyoderma gangrenosum although I may be putting words into his mouth. He seemed to recognize this. On further questioning he admits to a 5  year history of quiesced. ulcerative colitis. He is not in any treatment for this. He's had no recent travel 12/11/16; the patient arrives today with his wound and roughly the same condition we've been using silver alginate this is a deep punched out wound with some surrounding erythema but no tenderness. Biopsy I did did not show confirmed pyoderma gangrenosum suggested nonspecific inflammation and vasculitis but does not provide an actual description of what was seen by the pathologist. I'm really not able to understand this We have also received information from the patient's dermatologist in [place] notes from April 2016. This was a doctor Agarwal-antal. 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 He was prescribed topical steroid high potency under occlusion which helped but at this point the patient did not have a deep punched out wound. 12/18/16; the patient's wound is larger in terms of surface area however this surface looks better and there is less depth. The surrounding erythema also is better. The patient states that the wrap we put on came off 2 days ago when he has been using his compression stockings. He we are in the process of getting a dermatology consult. 12/26/16 on evaluation today patient's left lower extremity wound shows evidence of infection with surrounding [name], [name] ([id number]) erythema noted. He has been tolerating the dressing changes but states that he has noted more discomfort. There is a larger area of erythema surrounding the wound. No fevers, chills, nausea, or vomiting noted at this time. With that being said the wound still does have slough covering the surface. He is not allergic to any medication that he is aware of at this point. In regard to his right lower extremity he had several regions that are  erythematous and pruritic he wonders if there's anything we can do to help that. Objective Constitutional Obese and well-hydrated in no acute distress. Vitals Time Taken: 8:24 AM, Height: 71 in, Weight: 338 lbs, BMI: 47.1, Temperature: 98.2 F, Pulse: 77 bpm, Respiratory Rate: 16 breaths/min, Blood Pressure: 127/79 mmHg. Respiratory normal breathing without difficulty. clear to auscultation bilaterally. Cardiovascular regular rate and rhythm with normal S1, S2. Psychiatric this patient is able to make decisions and demonstrates good insight into disease process. Alert and Oriented x 3. pleasant and cooperative. Integumentary (Hair, Skin) Wound #1 status is Open. Original cause of wound was Gradually Appeared. The wound is located on the Left,Lateral Lower Leg. The wound measures 4.2cm length x 3.5cm width x 0.5cm depth; 11.545cm^2 area and 5.773cm^3 volume. There is Fat Layer (Subcutaneous Tissue) Exposed exposed. There is no tunneling or undermining noted. There is a large amount of serosanguineous drainage noted. The wound margin is flat and intact. There is small (1-33%) granulation within the wound bed. There is a large (67-100%) amount of necrotic tissue within the wound bed including Adherent Slough. The periwound skin appearance exhibited: Excoriation, Rubor. The periwound skin appearance did not exhibit: Callus, Crepitus, Induration, Rash, Scarring, Dry/Scaly, Maceration, Atrophie Blanche, Cyanosis, Ecchymosis, Hemosiderin Staining, Mottled, Pallor, Erythema. Periwound temperature was noted as Hot. [name], [name] ([id number]) Assessment Active Problems ICD-10 [phone] - 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sis of muscle I87.2 - Venous insufficiency (chronic) (peripheral) Plan Wound Cleansing: Wound #1 Left,Lateral Lower Leg: May Shower, gently pat wound dry prior to applying new dressing. No tub bath. Anesthetic: Wound #1 Left,Lateral Lower Leg: Topical  Lidocaine 4% cream applied to wound bed prior to debridement - In Clinic Skin Barriers/Peri-Wound Care: Wound #1 Left,Lateral Lower Leg: Barrier cream Triamcinolone Acetonide  Ointment Primary Wound Dressing: Santyl Ointment - Medihoney or Manuka Honey if Santyl too expensive Secondary Dressing: Wound #1 Left,Lateral Lower Leg: ABD pad Dressing Change Frequency: Wound #1 Left,Lateral Lower Leg: Change dressing every day. Follow-up Appointments: Wound #1 Left,Lateral Lower Leg: Return Appointment in 1 week. Edema Control: Wound #1 Left,Lateral Lower Leg: Patient to wear own compression stockings Elevate legs to the level of the heart and pump ankles as often as possible Additional Orders / Instructions: Wound #1 Left,Lateral Lower Leg: Increase protein intake. Activity as tolerated Medications-please add to medication list.: Wound #1 Left,Lateral Lower Leg: P.O. Antibiotics - Doxycycline Topical Antibiotic - TAC Santyl Enzymatic Ointment - Medihoney or Manuka Honey if Santyl too expensive [name], [name] ([id number]) The following medication(s) was prescribed: Santyl topical 250 unit/gram ointment ointment topical applied to the left LE wound nickel thick and then cover with dressing daily starting 12/26/2016 doxycycline hyclate oral 100 mg capsule 1 1 capsule oral every 12 hours x 10 days starting 12/26/2016 triamcinolone acetonide topical 0.1 % ointment ointment topical apply a thin film to the affected areas of the right lower extremity 2 times a day starting 12/26/2016 General Notes: A wound culture was obtained today and will be sent for further evaluation. In the interim I'm gonna go ahead and place him on doxycycline 100 mg per above as well as initiating treatment with Santyl. I'm also going to give him a prescription for triamcinolone to use on his opposite lower extremity where he has several itchy lesions. We will see him for reevaluation in one week to see were things stand at  that point. If anything worsened significantly in the meantime he will contact our office for additional recommendations. Electronic Signature(s) Signed: 12/27/2016 10:18:42 AM By: Worthy Keeler PA-C Entered By: Worthy Keeler on 12/27/2016 08:29:00 [name], [name] ([id number]) -------------------------------------------------------------------------------- SuperBill Details Patient Name: [name] Date of Service: 12/26/2016 Medical Record Number: [id number] Patient Account Number: [id number] Date of Birth/Sex: [birth year] (35 y.o. Male) Treating RN: [name] Primary Care Provider: [name] Other Clinician: Referring Provider: [name] Treating Provider/Extender: Melburn Hake, Levie Wages Weeks in Treatment: 3 Diagnosis Coding ICD-10 Codes Code Description [phone] 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sis of muscle I87.2 Venous insufficiency (chronic) (peripheral) Facility Procedures CPT4 Code: [id number] Description: [phone] - WOUND CARE VISIT-LEV 1 EST PT Modifier: Quantity: 1 CPT4 Code: [id number] Description: 00174 - DEBRIDE W/O ANES NON SELECT Modifier: Quantity: 1 Physician Procedures CPT4 Code: [id number] Description: 91638 - WC PHYS LEVEL 4 - EST PT ICD-10 Description Diagnosis L97.223 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 I87.2 Venous insufficiency (chronic) (peripheral) Modifier: sis of muscle Quantity: 1 Electronic Signature(s) Signed: 12/27/2016 10:18:42 AM By: Worthy Keeler PA-C Previous Signature: 12/26/2016 11:10:26 AM Version By: [name], [name], RN, [name], [name], [name] Entered By: Worthy Keeler on 12/27/2016 46:65:99

## 2016-12-29 LAB — AEROBIC CULTURE W GRAM STAIN (SUPERFICIAL SPECIMEN)

## 2017-01-02 ENCOUNTER — Encounter: Payer: 59 | Admitting: Physician Assistant

## 2017-01-02 DIAGNOSIS — S81802A Unspecified open wound, left lower leg, initial encounter: Secondary | ICD-10-CM | POA: Diagnosis not present

## 2017-01-02 DIAGNOSIS — L97223 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sis of muscle: Secondary | ICD-10-CM | POA: Diagnosis not present

## 2017-01-02 DIAGNOSIS — B965 Pseudomonas (aeruginosa) (mallei) (pseudomallei) as the cause of diseases classified elsewhere: Secondary | ICD-10-CM | POA: Diagnosis not present

## 2017-01-03 NOTE — Progress Notes (Signed)
[name], [name] ([id number]) Visit Report for 01/02/2017 Chief Complaint Document Details Patient Name: [name], [name] Date of Service: 01/02/2017 8:15 AM Medical Record Number: [id number] Patient Account Number: [id number] Date of Birth/Sex: 1980/09/21 (35 y.o. Male) Treating RN: Baruch Gouty, RN, [name], Velva Harman Primary Care Provider: [name] Other Clinician: Referring Provider: [name] Treating Provider/Extender: Melburn Hake, Harriet Bollen Weeks in Treatment: 4 Information Obtained from: Patient Chief Complaint 12/04/16; patient is here for review of a wound on his posterior left calf Electronic Signature(s) Signed: 01/02/2017 12:41:32 PM By: Worthy Keeler PA-C Entered By: Worthy Keeler on 01/02/2017 08:31:17 [name] ([id number]) -------------------------------------------------------------------------------- HPI Details Patient Name: [name] Date of Service: 01/02/2017 8:15 AM Medical Record Number: [id number] Patient Account Number: [id number] Date of Birth/Sex: [birth year] (35 y.o. Male) Treating RN: Baruch Gouty, RN, [name], [place] Primary Care Provider: [name] Other Clinician: Referring Provider: [name] Treating Provider/Extender: Melburn Hake, Azaela Caracci Weeks in Treatment: 4 History of Present Illness HPI Description: 12/04/16; 36 year old man who comes into the clinic today for review of a wound on the posterior left calf. He tells me that is been there for about a year. He is not a diabetic he does smoke half a pack per day. He was seen in the ER on 11/20/16 felt to have cellulitis around the wound and was given clindamycin. An x-ray did not show osteomyelitis. The patient initially tells me that he has a milk allergy that sets off a pruritic itching rash on his lower legs which she scratches incessantly and he thinks that's what may have set up the wound. He has been using various topical antibiotics and ointments without any effect. He works in a [employer]ing [employer] and is on his  feet all day. He does not have a prior history of wounds however he does have the rash on both lower legs the right arm and the ventral aspect of his left arm. These are excoriations and clearly have had scratching however there are of macular looking areas on both legs including a substantial larger area on the right leg. This does not have an underlying open area. There is no blistering. The patient tells me that 2 years ago in [place] in response to the rash on his legs he saw a dermatologist who told him he had a condition which may be pyoderma gangrenosum although I may be putting words into his mouth. He seemed to recognize this. 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 He is not in any treatment for this. He's had no recent travel 12/11/16; the patient arrives today with his wound and roughly the same condition we've been using silver alginate this is a deep punched out wound with some surrounding erythema but no tenderness. Biopsy I did did not show confirmed pyoderma gangrenosum suggested nonspecific inflammation and vasculitis but does not provide an actual description of what was seen by the pathologist. I'm really not able to understand this We have also received information from the patient's dermatologist in [place] notes from April 2016. This was a doctor Agarwal-antal. 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 He was prescribed topical steroid high potency under occlusion which helped but at this point the patient did not have a deep punched out wound. 12/18/16; the patient's wound is larger in terms of surface area however this surface looks better and there is less depth. The surrounding erythema also is better. The patient states that the wrap we put on came off 2 days  ago when he has been using his compression stockings. He we are in the process of getting a dermatology consult. 12/26/16 on evaluation today patient's left  lower extremity wound shows evidence of infection with surrounding erythema noted. He has been tolerating the dressing changes but states that he has noted more discomfort. There is a larger area of erythema surrounding the wound. No fevers, chills, nausea, or vomiting noted at this time. With that being said the wound still does have slough covering the surface. He is not allergic to any medication that he is aware of at this point. In regard to his right lower extremity he had several regions that are erythematous and pruritic he wonders if there's anything we can do to help that. [name], [name] ([id number]) 01/02/17 I reviewed patient's wound culture which was obtained his visit last week. He was placed on doxycycline at that point. Unfortunately that does not appear to be an antibiotic that would likely help with the situation however the pseudomonas noted on culture is sensitive to Cipro. Also unfortunately patient's wound seems to have a large compared to last week's evaluation. Not severely so but there are definitely increased measurements in general. He is continuing to have discomfort as well he writes this to be a seven out of 10. In fact he would prefer me not to perform any debridement today due to the fact that he is having discomfort and considering he has an active infection on the little reluctant to do so anyway. No fevers, chills, nausea, or vomiting noted at this time. Electronic Signature(s) Signed: 01/02/2017 12:41:32 PM By: Worthy Keeler PA-C Entered By: Worthy Keeler on 01/02/2017 08:49:22 [name], [name] ([id number]) -------------------------------------------------------------------------------- Physical Exam Details Patient Name: [name] Date of Service: 01/02/2017 8:15 AM Medical Record Number: [id number] Patient Account Number: [id number] Date of Birth/Sex: [birth year] (35 y.o. Male) Treating RN: Baruch Gouty, RN, [name], Velva Harman Primary Care Provider: [name] Other  Clinician: Referring Provider: [name] Treating Provider/Extender: Melburn Hake, Geremy Rister Weeks in Treatment: 4 Constitutional Well-nourished and well-hydrated in no acute distress. Respiratory normal breathing without difficulty. clear to auscultation bilaterally. Cardiovascular regular rate and rhythm with normal S1, S2. Psychiatric this patient is able to make decisions and demonstrates good insight into disease process. Alert and Oriented x 3. pleasant and cooperative. Notes Patient's wound continues to show significant erythema surrounding. I did review his culture today and he does have a Pseudomonas infection the doxycycline is not treating as we are going to have to switch and Cipro. Since he had an active infection and pain I'm going to avoid sharp debridement today he will continue the [employer]) Signed: 01/02/2017 12:41:32 PM By: Worthy Keeler PA-C Entered By: Worthy Keeler on 01/02/2017 08:50:01 [name], [name] ([id number]) -------------------------------------------------------------------------------- Physician Orders Details Patient Name: [name] Date of Service: 01/02/2017 8:15 AM Medical Record Number: [id number] Patient Account Number: [id number] Date of Birth/Sex: [birth year] (35 y.o. Male) Treating RN: Montey Hora Primary Care Provider: [name] Other Clinician: Referring Provider: [name] Treating Provider/Extender: Melburn Hake, Seaton Hofmann Weeks in Treatment: 4 Verbal / Phone Orders: No Diagnosis Coding ICD-10 Coding Code Description [id number] 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sis of muscle I87.2 Venous insufficiency (chronic) (peripheral) Wound Cleansing Wound #1 Left,Lateral Lower Leg o May Shower, gently pat wound dry prior to applying new dressing. o No tub bath. Anesthetic Wound #1 Left,Lateral Lower Leg o Topical Lidocaine 4% cream applied to wound bed prior to debridement - In Clinic Skin  Barriers/Peri-Wound  Care Wound #1 Left,Lateral Lower Leg o Barrier cream o Triamcinolone Acetonide Ointment Primary Wound Dressing o Santyl Ointment Secondary Dressing Wound #1 Left,Lateral Lower Leg o XtraSorb Dressing Change Frequency Wound #1 Left,Lateral Lower Leg o Change dressing every day. Follow-up Appointments Wound #1 Left,Lateral Lower Leg o Return Appointment in 1 week. Edema Control [name], [name] ([id number]) Wound #1 Left,Lateral Lower Leg o Patient to wear own compression stockings o Elevate legs to the level of the heart and pump ankles as often as possible Additional Orders / Instructions Wound #1 Left,Lateral Lower Leg o Increase protein intake. o Activity as tolerated Medications-please add to medication list. Wound #1 Left,Lateral Lower Leg o P.O. Antibiotics - Change to Cipro per culture. o Topical Antibiotic - TAC o Santyl Enzymatic Ointment Patient Medications Allergies: mik, Biaxin, seasonal Notifications Medication Indication Start End ciprofloxacin HCl 01/02/2017 DOSE oral 750 mg tablet - Take 1 tablet by mouth every 12 hours x 10 days Notes I'm gonna recommend the patient continue with the Current wound care measures for the next week. We will see him for reevaluation at that point and hopefully should be able to perform some debridement at that time as well as I'm hopeful that the wound will be doing differently better. In regard to pain especially. If anything worsens in the interim he will contact our office for additional recommendations. Otherwise we will see were things stand in one week. Electronic Signature(s) Signed: 01/02/2017 12:41:32 PM By: Worthy Keeler PA-C Signed: 01/02/2017 1:20:34 PM By: [name] [name], RN, [name], [name], [name] Previous Signature: 01/02/2017 8:54:13 AM Version By: Worthy Keeler PA-C Entered By: [name] [name], RN, [name], [name] on 01/02/2017 09:06:54 [name], [name]  ([id number]) -------------------------------------------------------------------------------- Problem List Details Patient Name: [name] Date of Service: 01/02/2017 8:15 AM Medical Record Number: [id number] Patient Account Number: [id number] Date of Birth/Sex: 1980/08/03 (35 y.o. 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 Male) Treating RN: Baruch Gouty, RN, [name], [place] Primary Care Provider: [name] Other Clinician: Referring Provider: [name] Treating Provider/Extender: Melburn Hake, Joshuah Minella Weeks in Treatment: 4 Subjective Chief Complaint Information obtained from Patient 12/04/16; patient is here for review of a wound on his posterior left calf History of Present Illness (HPI) 12/04/16; 36 year old man who comes into the clinic today for review of a wound on the posterior left calf. He tells me that is been there for about a year. He is not a diabetic he does smoke half a pack per day. He was seen in the ER on 11/20/16 felt to have cellulitis around the wound and was given clindamycin. An x-ray did not show osteomyelitis. The patient  initially tells me that he has a milk allergy that sets off a pruritic itching rash on his lower legs which she scratches incessantly and he thinks that's what may have set up the wound. He has been using various topical antibiotics and ointments without any effect.  He works in a [employer]ing [employer] and is on his feet all day. He does not have a prior history of wounds however he does have the rash on both lower legs the right arm and the ventral aspect of his left arm. These are excoriations and clearly have had scratching however there are of macular looking areas on both legs including a substantial larger area on the right leg. This does not have an underlying open area. There is no blistering. The patient tells me that 2 years ago in [place] in response to the rash on his legs he saw a dermatologist who told him he had a condition which may be pyoderma gangrenosum although I may be putting words into his mouth. He seemed to recognize this. 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 He is not in any treatment for this. He's had no recent travel 12/11/16; the patient arrives today with his wound and roughly the same condition we've been using silver alginate this is a deep punched out wound with some surrounding erythema but no tenderness. Biopsy I did did not show confirmed pyoderma gangrenosum suggested nonspecific inflammation and vasculitis but does not provide an actual description of what was seen by the pathologist. I'm really not able to understand this We have also received information from the patient's dermatologist in [place] notes from April 2016. This was a doctor Agarwal-antal. 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 He was prescribed topical steroid high potency under occlusion which helped but at this point the patient did not have a deep punched out wound. 12/18/16; the patient's wound is larger in terms of surface area however  this surface looks better and there is less depth. The surrounding erythema also is better. The patient states that the wrap we put on came off 2 days ago when he has been using his compression stockings. He we are in the process of getting a dermatology consult. 12/26/16 on evaluation today patient's left lower extremity wound shows evidence of infection with surrounding [name], [name] ([id number]) erythema noted. He has been tolerating the dressing changes but states that he has noted more discomfort. There is a larger area of erythema surrounding the wound. No fevers, chills, nausea, or vomiting noted at this time. With that being said the wound still does have slough covering the surface. He is not allergic to any medication that he is aware of at this point. In regard to his right lower extremity he had several regions that are erythematous and pruritic he wonders if there's anything we can do to help that. 01/02/17 I reviewed patient's wound culture which was obtained his visit last week. He was placed on doxycycline at that point. Unfortunately that does not appear to be an antibiotic that would likely help with the situation however the pseudomonas noted on culture is sensitive to Cipro. Also unfortunately patient's wound seems to have a large compared to last week's evaluation. Not severely so but there are definitely increased measurements in general. He is continuing to have discomfort as well he writes this to be a seven out of 10. In fact he would prefer me not to perform any debridement today due to the fact that he is having discomfort and considering he has an active infection on the little reluctant to do so anyway. No fevers, chills, nausea, or vomiting noted at this time. Objective Constitutional Well-nourished and well-hydrated in no acute distress. Vitals Time Taken: 8:33 AM, Height:  71 in, Source: Measured, Weight: 338 lbs, Source: Measured, BMI: 47.1, Temperature: 98.0 F,  Pulse: 70 bpm, Respiratory Rate: 16 breaths/min, Blood Pressure: 136/75 mmHg. Respiratory normal breathing without difficulty. clear to auscultation bilaterally. Cardiovascular regular rate and rhythm with normal S1, S2. Psychiatric this patient is able to make decisions and demonstrates good insight into disease process. Alert and Oriented x 3. pleasant and cooperative. General Notes: Patient's wound continues to show significant erythema surrounding. I did review his culture today and he does have a Pseudomonas infection the doxycycline is not treating as we are going to have to switch and Cipro. Since he had an active infection and pain I'm going to avoid sharp debridement today he will continue the Santyl Integumentary (Hair, Skin) Wound #1 status is Open. Original cause of wound was Gradually Appeared. The wound is located on the [place] ([id number]) Left,Lateral Lower Leg. The wound measures 4.6cm length x 3.7cm width x 0.6cm depth; 13.367cm^2 area and 8.02cm^3 volume. There is Fat Layer (Subcutaneous Tissue) Exposed exposed. There is no tunneling or undermining noted. There is a large amount of serosanguineous drainage noted. The wound margin is flat and intact. There is medium (34-66%) granulation within the wound bed. There is a medium (34-66%) amount of necrotic tissue within the wound bed including Adherent Slough. The periwound skin appearance exhibited: Excoriation, Rubor, Erythema. The periwound skin appearance did not exhibit: Callus, Crepitus, Induration, Rash, Scarring, Dry/Scaly, Maceration, Atrophie Blanche, Cyanosis, Ecchymosis, Hemosiderin Staining, Mottled, Pallor. The surrounding wound skin color is noted with erythema which is circumferential. Periwound temperature was noted as Hot. Assessment Active Problems ICD-10 L97.223 - 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sis of muscle I87.2 - Venous insufficiency (chronic) (peripheral) Plan Wound  Cleansing: Wound #1 Left,Lateral Lower Leg: May Shower, gently pat wound dry prior to applying new dressing. No tub bath. Anesthetic: Wound #1 Left,Lateral Lower Leg: Topical Lidocaine 4% cream applied to wound bed prior to debridement - In Clinic Skin Barriers/Peri-Wound Care: Wound #1 Left,Lateral Lower Leg: Barrier cream Triamcinolone Acetonide Ointment Primary Wound Dressing: Santyl Ointment Secondary Dressing: Wound #1 Left,Lateral Lower Leg: XtraSorb Dressing Change Frequency: Wound #1 Left,Lateral Lower Leg: Change dressing every day. Follow-up Appointments: Wound #1 Left,Lateral Lower Leg: Return Appointment in 1 week. [name], [name] ([id number]) Edema Control: Wound #1 Left,Lateral Lower Leg: Patient to wear own compression stockings Elevate legs to the level of the heart and pump ankles as often as possible Additional Orders / Instructions: Wound #1 Left,Lateral Lower Leg: Increase protein intake. Activity as tolerated Medications-please add to medication list.: Wound #1 Left,Lateral Lower Leg: P.O. Antibiotics - Change to Cipro per culture. Topical Antibiotic - TAC Santyl Enzymatic Ointment The following medication(s) was prescribed: ciprofloxacin HCl oral 750 mg tablet Take 1 tablet by mouth every 12 hours x 10 days starting 01/02/2017 General Notes: I'm gonna recommend the patient continue with the Current wound care measures for the next week. We will see him for reevaluation at that point and hopefully should be able to perform some debridement at that time as well as I'm hopeful that the wound will be doing differently better. In regard to pain especially. If anything worsens in the interim he will contact our office for additional recommendations. Otherwise we will see were things stand in one week. Electronic Signature(s) Signed: 01/02/2017 12:41:32 PM By: Worthy Keeler PA-C Entered By: Worthy Keeler on 01/02/2017 09:10:10 [name]  ([id number]) -------------------------------------------------------------------------------- SuperBill Details Patient Name: [name] Date of Service: 01/02/2017 Medical Record Number:  [id number] Patient Account Number: [id number] Date of Birth/Sex: 06-03-1980 (35 y.o. Male) Treating RN: Montey Hora Primary Care Provider: [name] Other Clinician: Referring Provider: [name] Treating Provider/Extender: Melburn Hake, Elzia Hott Weeks in Treatment: 4 Diagnosis Coding ICD-10 Codes Code Description [phone] 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sis of muscle I87.2 Venous insufficiency (chronic) (peripheral) Facility Procedures CPT4 Code: [id number] Description: 60045 - DEBRIDE W/O ANES NON SELECT Modifier: Quantity: 1 Physician Procedures CPT4 Code: [id number] Description: 23953 - WC PHYS LEVEL 4 - EST PT ICD-10 Description Diagnosis L97.223 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 I87.2 Venous insufficiency (chronic) (peripheral) Modifier: sis of muscle Quantity: 1 Electronic Signature(s) Signed: 01/02/2017 12:41:32 PM By: Worthy Keeler PA-C Signed: 01/02/2017 1:20:34 PM By: [name], [name], RN, [name], [name], [name] Entered By: [name], [name], RN, [name], [name] on 01/02/2017 09:23:37

## 2017-01-05 NOTE — Progress Notes (Signed)
SHERIF, MILLSPAUGH ([id number]) Visit Report for 01/02/2017 Arrival Information Details Patient Name: [name], [name] Date of Service: 01/02/2017 8:15 AM Medical Record Number: [id number] Patient Account Number: [id number] Date of Birth/Sex: 1980/12/18 (35 y.o. Male) Treating RN: Montey Hora Primary Care Hisashi Amadon: [name] Other Clinician: Referring Quitman Norberto: [name] Treating Tanija Germani/Extender: Melburn Hake, [name]: 4 Visit Information History Since Last Visit Added or deleted any medications: No Patient Arrived: Ambulatory Any new allergies or adverse reactions: No Arrival Time: 08:32 Had a fall or experienced change in No Accompanied By: self activities of daily living that may affect Transfer Assistance: None risk of falls: Patient Identification Verified: Yes Signs or symptoms of abuse/neglect since last No Secondary Verification Process Yes visito Completed: Hospitalized since last visit: No Patient Requires Transmission-Based No Has Dressing in Place as Prescribed: Yes Precautions: Pain Present Now: Yes Patient Has Alerts: No Electronic Signature(s) Signed: 01/02/2017 2:04:16 PM By: Montey Hora Entered By: Montey Hora on 01/02/2017 08:33:30 Kinsel, [name] ([id number]) -------------------------------------------------------------------------------- Clinic Level of Care Assessment Details Patient Name: [name] Date of Service: 01/02/2017 8:15 AM Medical Record Number: [id number] Patient Account Number: [id number] Date of Birth/Sex: 11-14-1980 (35 y.o. Male) Treating RN: [name] Primary Care Irma Delancey: [name] Other Clinician: Referring Emilygrace Grothe: [name] Treating Theseus Birnie/Extender: Melburn Hake, [name]: 4 Clinic Level of Care Assessment Items TOOL 4 Quantity Score []  - Use when only an EandM is performed on FOLLOW-UP visit 0 ASSESSMENTS - Nursing Assessment / Reassessment []  - Reassessment of  Co-morbidities (includes updates in patient status) 0 X - Reassessment of Adherence to Treatment Plan 1 5 ASSESSMENTS - Wound and Skin Assessment / Reassessment X - Simple Wound Assessment / Reassessment - one wound 1 5 []  - Complex Wound Assessment / Reassessment - multiple wounds 0 []  - Dermatologic / Skin Assessment (not related to wound area) 0 ASSESSMENTS - Focused Assessment []  - Circumferential Edema Measurements - multi extremities 0 []  - Nutritional Assessment / Counseling / Intervention 0 []  - Lower Extremity Assessment (monofilament, tuning fork, pulses) 0 []  - Peripheral Arterial Disease Assessment (using hand held doppler) 0 ASSESSMENTS - Ostomy and/or Continence Assessment and Care []  - Incontinence Assessment and Management 0 []  - Ostomy Care Assessment and Management (repouching, etc.) 0 PROCESS - Coordination of Care X - Simple Patient / Family Education for ongoing care 1 15 []  - Complex (extensive) Patient / Family Education for ongoing care 0 X - Staff obtains Programmer, systems, Records, Test Results / Process Orders 1 10 []  - Staff telephones HHA, Nursing Homes / Clarify orders / etc 0 []  - Routine Transfer to another Facility (non-emergent condition) 0 Riviello, Yaman ([id number]) []  - Routine Hospital Admission (non-emergent condition) 0 []  - New Admissions / Biomedical engineer / Ordering NPWT, Apligraf, etc. 0 []  - Emergency Hospital Admission (emergent condition) 0 X - Simple Discharge Coordination 1 10 []  - Complex (extensive) Discharge Coordination 0 PROCESS - Special Needs []  - Pediatric / Minor Patient Management 0 []  - Isolation Patient Management 0 []  - Hearing / Language / Visual special needs 0 []  - Assessment of Community assistance (transportation, D/C planning, etc.) 0 []  - Additional assistance / Altered mentation 0 []  - Support Surface(s) Assessment (bed, cushion, seat, etc.) 0 INTERVENTIONS - Wound Cleansing / Measurement X - Simple Wound Cleansing -  one wound 1 5 []  - Complex Wound Cleansing - multiple wounds 0 X - Wound Imaging (photographs - any number of wounds) 1 5 []  - Wound Tracing (instead of  photographs) 0 X - Simple Wound Measurement - one wound 1 5 []  - Complex Wound Measurement - multiple wounds 0 INTERVENTIONS - Wound Dressings []  - Small Wound Dressing one or multiple wounds 0 X - Medium Wound Dressing one or multiple wounds 1 15 []  - Large Wound Dressing one or multiple wounds 0 []  - Application of Medications - topical 0 []  - Application of Medications - injection 0 INTERVENTIONS - Miscellaneous []  - External ear exam 0 Nordmeyer, Tradarius ([id number]) []  - Specimen Collection (cultures, biopsies, blood, body fluids, etc.) 0 []  - Specimen(s) / Culture(s) sent or taken to Lab for analysis 0 []  - Patient Transfer (multiple staff / [name] / Similar devices) 0 []  - Simple Staple / Suture removal (25 or less) 0 []  - Complex Staple / Suture removal (26 or more) 0 []  - Hypo / Hyperglycemic Management (close monitor of Blood Glucose) 0 []  - Ankle / Brachial Index (ABI) - do not check if billed separately 0 X - Vital Signs 1 5 Has the patient been seen at the hospital within the last three years: Yes Total Score: 80 Level Of Care: New/Established - Level 3 Electronic Signature(s) Signed: 01/02/2017 1:20:34 PM By: [name], [name], RN, [name], [name], [name] Entered By: [name], [name], RN, [name], [name] on 01/02/2017 09:22:46 [name] ([id number]) -------------------------------------------------------------------------------- Encounter Discharge Information Details Patient Name: [name] Date of Service: 01/02/2017 8:15 AM Medical Record Number: [id number] Patient Account Number: [id number] Date of Birth/Sex: [birth year] (35 y.o. Male) Treating RN: Montey Hora Primary Care Cieara Stierwalt: [name] Other Clinician: Referring Duron Meister: [name] Treating Elvira Langston/Extender: Melburn Hake, [name]: 4 Encounter  Discharge Information Items Discharge Pain Level: 0 Discharge Condition: Stable Ambulatory Status: Ambulatory Discharge Destination: Home Transportation: Private Auto Accompanied By: self Schedule Follow-up Appointment: Yes Medication Reconciliation completed No and provided to Patient/Care Cleveland Paiz: Patient Clinical Summary of Care: Declined Electronic Signature(s) Signed: 01/03/2017 1:46:33 PM By: Ruthine Dose Entered By: Ruthine Dose on 01/02/2017 09:06:58 [name] ([id number]) -------------------------------------------------------------------------------- Lower Extremity Assessment Details Patient Name: [name] Date of Service: 01/02/2017 8:15 AM Medical Record Number: [id number] Patient Account Number: [id number] Date of Birth/Sex: February 06, 1981 (35 y.o. Male) Treating RN: Montey Hora Primary Care Masud Holub: [name] Other Clinician: Referring Riddik Senna: [name] Treating Lasean Gorniak/Extender: Melburn Hake, [name]: 4 Edema Assessment Assessed: [Left: No] [Right: No] E[Left: dema] [Right: :] Calf Left: Right: Point of Measurement: 33 cm From Medial Instep 51.9 cm cm Ankle Left: Right: Point of Measurement: 9 cm From Medial Instep 30.5 cm cm Vascular Assessment Pulses: Dorsalis Pedis Palpable: [Left:Yes] Posterior Tibial Extremity colors, hair growth, and conditions: Extremity Color: [Left:Red] Hair Growth on Extremity: [Left:Yes] Temperature of Extremity: [Left:Warm] Capillary Refill: [Left:< 3 seconds] Toe Nail Assessment Left: Right: Thick: No Discolored: No Deformed: No Improper Length and Hygiene: No Electronic Signature(s) Signed: 01/02/2017 2:04:16 PM By: Montey Hora Entered By: Montey Hora on 01/02/2017 08:40:03 [name] ([id number]) -------------------------------------------------------------------------------- Multi Wound Chart Details Patient Name: [name] Date of Service: 01/02/2017 8:15  AM Medical Record Number: [id number] Patient Account Number: [id number] Date of Birth/Sex: [birth year] (35 y.o. Male) Treating RN: Montey Hora Primary Care Tennile Styles: [name] Other Clinician: Referring Makalia Bare: [name] Treating Elyzabeth Goatley/Extender: STONE III, [name]: 4 Vital Signs Height(in): 71 Pulse(bpm): 70 Weight(lbs): 338 Blood Pressure 136/75 (mmHg): Body Mass Index(BMI): 47 Temperature(F): 98.0 Respiratory Rate 16 (breaths/min): Photos: [1:No Photos] [N/A:N/A] Wound Location: [1:Left Lower Leg - Lateral] [N/A:N/A] Wounding Event: [1:Gradually Appeared] [N/A:N/A] Primary Etiology: [1:Venous  Leg Ulcer] [N/A:N/A] Comorbid History: [1:Sleep Apnea, Hypertension, Colitis] [N/A:N/A] Date Acquired: [1:11/18/2015] [N/A:N/A] Weeks of Treatment: [1:4] [N/A:N/A] Wound Status: [1:Open] [N/A:N/A] Measurements L x W x D 4.6x3.7x0.6 [N/A:N/A] (cm) Area (cm) : [1:13.367] [N/A:N/A] Volume (cm) : [1:8.02] [N/A:N/A] % Reduction in Area: [1:-172.30%] [N/A:N/A] % Reduction in Volume: -104.20% [N/A:N/A] Classification: [1:Full Thickness Without Exposed Support Structures] [N/A:N/A] Exudate Amount: [1:Large] [N/A:N/A] Exudate Type: [1:Serosanguineous] [N/A:N/A] Exudate Color: [1:red, brown] [N/A:N/A] Wound Margin: [1:Flat and Intact] [N/A:N/A] Granulation Amount: [1:Medium (34-66%)] [N/A:N/A] Necrotic Amount: [1:Medium (34-66%)] [N/A:N/A] Exposed Structures: [1:Fat Layer (Subcutaneous Tissue) Exposed: Yes Fascia: No Tendon: No Muscle: No] [N/A:N/A] Joint: No Bone: No Epithelialization: None N/A N/A Periwound Skin Texture: Excoriation: Yes N/A N/A Induration: No Callus: No Crepitus: No Rash: No Scarring: No Periwound Skin Maceration: No N/A N/A Moisture: Dry/Scaly: No Periwound Skin Color: Erythema: Yes N/A N/A Rubor: Yes Atrophie Blanche: No Cyanosis: No Ecchymosis: No Hemosiderin Staining: No Mottled: No Pallor: No Erythema Location:  Circumferential N/A N/A Temperature: Hot N/A N/A Tenderness on No N/A N/A Palpation: Wound Preparation: Ulcer Cleansing: N/A N/A Rinsed/Irrigated with Saline Topical Anesthetic Applied: Other: Lidocaine 4% Treatment Notes Electronic Signature(s) Signed: 01/02/2017 2:04:16 PM By: Montey Hora Entered By: Montey Hora on 01/02/2017 08:43:58 [name] ([id number]) -------------------------------------------------------------------------------- Multi-Disciplinary Care Plan Details Patient Name: [name] Date of Service: 01/02/2017 8:15 AM Medical Record Number: [id number] Patient Account Number: [id number] Date of Birth/Sex: 1980-11-12 (35 y.o. Male) Treating RN: Montey Hora Primary Care Ellis Koffler: [name] Other Clinician: Referring Keiston Manley: [name] Treating Andyn Sales/Extender: Melburn Hake, [name]: 4 Active Inactive ` Orientation to the Wound Care Program Nursing Diagnoses: Knowledge deficit related to the wound healing center program Goals: Patient/caregiver will verbalize understanding of the [facility] Date Initiated: 12/11/2016 Target Resolution Date: 03/13/2017 Goal Status: Active Interventions: Provide education on orientation to the wound center Notes: ` Venous Leg Ulcer Nursing Diagnoses: Knowledge deficit related to disease process and management Potential for venous Insuffiency (use before diagnosis confirmed) Goals: Non-invasive venous studies are completed as ordered Date Initiated: 12/11/2016 Target Resolution Date: 03/13/2017 Goal Status: Active Patient will maintain optimal edema control Date Initiated: 12/11/2016 Target Resolution Date: 03/13/2017 Goal Status: Active Patient/caregiver will verbalize understanding of disease process and disease management Date Initiated: 12/11/2016 Target Resolution Date: 03/13/2017 Goal Status: Active Verify adequate tissue perfusion prior to therapeutic  compression application Date Initiated: 12/11/2016 Target Resolution Date: 03/13/2017 Goal Status: Active Hodo, Joby ([id number]) Interventions: Assess peripheral edema status every visit. Compression as ordered Provide education on venous insufficiency Treatment Activities: Therapeutic compression applied : 12/11/2016 Notes: ` Wound/Skin Impairment Nursing Diagnoses: Impaired tissue integrity Knowledge deficit related to ulceration/compromised skin integrity Goals: Patient/caregiver will verbalize understanding of skin care regimen Date Initiated: 12/11/2016 Target Resolution Date: 03/13/2017 Goal Status: Active Ulcer/skin breakdown will have a volume reduction of 30% by week 4 Date Initiated: 12/11/2016 Target Resolution Date: 03/13/2017 Goal Status: Active Ulcer/skin breakdown will have a volume reduction of 50% by week 8 Date Initiated: 12/11/2016 Target Resolution Date: 03/13/2017 Goal Status: Active Ulcer/skin breakdown will have a volume reduction of 80% by week 12 Date Initiated: 12/11/2016 Target Resolution Date: 03/13/2017 Goal Status: Active Ulcer/skin breakdown will heal within 14 weeks Date Initiated: 12/11/2016 Target Resolution Date: 03/13/2017 Goal Status: Active Interventions: Assess patient/caregiver ability to obtain necessary supplies Assess patient/caregiver ability to perform ulcer/skin care regimen upon admission and as needed Assess ulceration(s) every visit Provide education on ulcer and skin care Treatment Activities: Skin care regimen initiated : 12/11/2016 Topical  wound management initiated : 12/11/2016 Notes: KISEAN, ROLLO ([id number]) Electronic Signature(s) Signed: 01/02/2017 2:04:16 PM By: Montey Hora Entered By: Montey Hora on 01/02/2017 08:43:47 THOMS, BARTHELEMY ([id number]) -------------------------------------------------------------------------------- Pain Assessment Details Patient Name: [name] Date of Service: 01/02/2017  8:15 AM Medical Record Number: [id number] Patient Account Number: [id number] Date of Birth/Sex: May 20, 1981 (35 y.o. Male) Treating RN: Montey Hora Primary Care Arda Daggs: [name] Other Clinician: Referring Tulip Meharg: [name] Treating Maecie Sevcik/Extender: Melburn Hake, [name]: 4 Active Problems Location of Pain Severity and Description of Pain Patient Has Paino Yes Site Locations Pain Location: Pain in Ulcers With Dressing Change: Yes Duration of the Pain. Constant / Intermittento Constant Character of Pain Describe the Pain: Aching, Burning Pain Management and Medication Current Pain Management: Electronic Signature(s) Signed: 01/02/2017 2:04:16 PM By: Montey Hora Entered By: Montey Hora on 01/02/2017 08:33:51 Nater, [name] ([id number]) -------------------------------------------------------------------------------- Patient/Caregiver Education Details Patient Name: [name] Date of Service: 01/02/2017 8:15 AM Medical Record Number: [id number] Patient Account Number: [id number] Date of Birth/Gender: 1980-08-12 (35 y.o. Male) Treating RN: Montey Hora Primary Care Physician: [name] Other Clinician: Referring Physician: [name] Treating Physician/Extender: Sharalyn Ink in Treatment: 4 Education Assessment Education Provided To: Patient Education Topics Provided Wound/Skin Impairment: Handouts: Other: wound care as ordered Methods: Demonstration, Explain/Verbal Responses: State content correctly Electronic Signature(s) Signed: 01/02/2017 2:04:16 PM By: Montey Hora Entered By: Montey Hora on 01/02/2017 08:55:40 Chaddock, [name] ([id number]) -------------------------------------------------------------------------------- Wound Assessment Details Patient Name: [name] Date of Service: 01/02/2017 8:15 AM Medical Record Number: [id number] Patient Account Number: [id number] Date of Birth/Sex: 1980/08/13  (35 y.o. Male) Treating RN: Montey Hora Primary Care Ronda Kazmi: [name] Other Clinician: Referring Alyona Romack: [name] Treating [employer] Null/Extender: Melburn Hake, [name]: 4 Wound Status Wound Number: 1 Primary Etiology: Venous Leg Ulcer Wound Location: Left Lower Leg - Lateral Wound Status: Open Wounding Event: Gradually Appeared Comorbid Sleep Apnea, Hypertension, History: Colitis Date Acquired: 11/18/2015 Weeks Of Treatment: 4 Clustered Wound: No Photos Photo Uploaded By: Montey Hora on 01/02/2017 14:00:01 Wound Measurements Length: (cm) 4.6 % Reduction in A Width: (cm) 3.7 % Reduction in V Depth: (cm) 0.6 Epithelializatio Area: (cm) 13.367 Tunneling: Volume: (cm) 8.02 Undermining: rea: -172.3% olume: -104.2% n: None No No Wound Description Full Thickness Without Exposed Foul Odor After Classification: Support Structures Slough/Fibrino Wound Margin: Flat and Intact Exudate Large Amount: Exudate Type: Serosanguineous Exudate Color: red, brown Cleansing: No Yes Wound Bed Granulation Amount: Medium (34-66%) Exposed Structure Necrotic Amount: Medium (34-66%) Fascia Exposed: No Necrotic Quality: Adherent Slough Fat Layer (Subcutaneous Tissue) Exposed: Yes Vorce, Daouda ([id number]) Tendon Exposed: No Muscle Exposed: No Joint Exposed: No Bone Exposed: No Periwound Skin Texture Texture Color No Abnormalities Noted: No No Abnormalities Noted: No Callus: No Atrophie Blanche: No Crepitus: No Cyanosis: No Excoriation: Yes Ecchymosis: No Induration: No Erythema: Yes Rash: No Erythema Location: Circumferential Scarring: No Hemosiderin Staining: No Mottled: No Moisture Pallor: No No Abnormalities Noted: No Rubor: Yes Dry / Scaly: No Maceration: No Temperature / Pain Temperature: Hot Wound Preparation Ulcer Cleansing: Rinsed/Irrigated with Saline Topical Anesthetic Applied: Other: Lidocaine 4%, Treatment  Notes Wound #1 (Left, Lateral Lower Leg) 1. Cleansed with: Clean wound with Normal Saline 2. Anesthetic Topical Lidocaine 4% cream to wound bed prior to debridement 3. Peri-wound Care: Barrier cream Other peri-wound care (specify in notes) 4. Dressing Applied: Santyl Ointment Other dressing (specify in notes) 7. Secured with Tape Notes Manufacturing systems engineer) Signed: 01/02/2017 2:04:16 PM By: Montey Hora Entered By: Montey Hora on  01/02/2017 08:40:40 HJALMAR, BALLENGEE ([id number]) -------------------------------------------------------------------------------- Vitals Details Patient Name: GORDIE, CRUMBY Date of Service: 01/02/2017 8:15 AM Medical Record Number: [id number] Patient Account Number: [id number] Date of Birth/Sex: 10/10/1980 (35 y.o. Male) Treating RN: Montey Hora Primary Care Tanielle Emigh: [name] Other Clinician: Referring Jordi Kamm: [name] Treating Noorah Giammona/Extender: Melburn Hake, [name]: 4 Vital Signs Time Taken: 08:33 Temperature (F): 98.0 Height (in): 71 Pulse (bpm): 70 Source: Measured Respiratory Rate (breaths/min): 16 Weight (lbs): 338 Blood Pressure (mmHg): 136/75 Source: Measured Reference Range: 80 - 120 mg / dl Body Mass Index (BMI): 47.1 Electronic Signature(s) Signed: 01/02/2017 2:04:16 PM By: Montey Hora Entered By: Montey Hora on 01/02/2017 08:34:16

## 2017-01-08 ENCOUNTER — Encounter: Payer: 59 | Admitting: Internal Medicine

## 2017-01-08 DIAGNOSIS — L97223 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sis of muscle: Secondary | ICD-10-CM | POA: Diagnosis not present

## 2017-01-08 DIAGNOSIS — L97222 Non-pressure chronic ulcer of left calf with fat layer exposed: Secondary | ICD-10-CM | POA: Diagnosis not present

## 2017-01-08 DIAGNOSIS — L959 Vasculitis limited to the skin, unspecified: Secondary | ICD-10-CM | POA: Diagnosis not present

## 2017-01-09 NOTE — Progress Notes (Signed)
HYMAN, CROSSAN ([id number]) Visit Report for 01/08/2017 Arrival Information Details Patient Name: [name], [name] Date of Service: 01/08/2017 8:15 AM Medical Record Number: [id number] Patient Account Number: [id number] Date of Birth/Sex: 1981/04/10 (35 y.o. Male) Treating RN: [name] Primary Care Landyn Lorincz: [name] Other Clinician: Referring Dillyn Joaquin: [name] Treating Quention Mcneill/Extender: Tito Dine in Treatment: 5 Visit Information History Since Last Visit Added or deleted any medications: No Patient Arrived: Ambulatory Any new allergies or adverse reactions: No Arrival Time: 08:26 Had a fall or experienced change in No Accompanied By: self activities of daily living that may affect Transfer Assistance: None risk of falls: Patient Identification Verified: Yes Signs or symptoms of abuse/neglect since last No Secondary Verification Process Yes visito Completed: Hospitalized since last visit: No Patient Requires Transmission-Based No Has Dressing in Place as Prescribed: Yes Precautions: Pain Present Now: No Patient Has Alerts: No Electronic Signature(s) Signed: 01/08/2017 4:57:33 PM By: [name], [name], RN, [name], [name], [name] Entered By: [name], [name], RN, [name], [name] on 01/08/2017 08:26:32 [name] ([id number]) -------------------------------------------------------------------------------- Encounter Discharge Information Details Patient Name: [name] Date of Service: 01/08/2017 8:15 AM Medical Record Number: [id number] Patient Account Number: [id number] Date of Birth/Sex: 1980-07-22 (35 y.o. Male) Treating RN: [name] Primary Care Angas Isabell: [name] Other Clinician: Referring Nohlan Burdin: [name] Treating Glyn Gerads/Extender: Tito Dine in Treatment: 5 Encounter Discharge Information Items Discharge Pain Level: 0 Discharge Condition: Stable Ambulatory Status: Ambulatory Discharge Destination: Home Transportation: Private  Auto Accompanied By: self Schedule Follow-up Appointment: Yes Medication Reconciliation completed Yes and provided to Patient/Care Brennyn Ortlieb: Patient Clinical Summary of Care: Declined Electronic Signature(s) Signed: 01/08/2017 4:21:28 PM By: Ruthine Dose Entered By: Ruthine Dose on 01/08/2017 08:53:04 Aydt, [name] ([id number]) -------------------------------------------------------------------------------- Lower Extremity Assessment Details Patient Name: [name] Date of Service: 01/08/2017 8:15 AM Medical Record Number: [id number] Patient Account Number: [id number] Date of Birth/Sex: May 16, 1981 (35 y.o. Male) Treating RN: [name] Primary Care Jream Broyles: [name] Other Clinician: Referring Carine Nordgren: [name] Treating Rhemi Balbach/Extender: Ricard Dillon Weeks in Treatment: 5 Edema Assessment Assessed: [Left: No] [Right: No] E[Left: dema] [Right: :] Calf Left: Right: Point of Measurement: 33 cm From Medial Instep 51.5 cm cm Ankle Left: Right: Point of Measurement: 11 cm From Medial Instep 30 cm cm Vascular Assessment Claudication: Claudication Assessment [Left:None] Pulses: Dorsalis Pedis Palpable: [Left:No] Posterior Tibial Extremity colors, hair growth, and conditions: Extremity Color: [Left:Hyperpigmented] Hair Growth on Extremity: [Left:Yes] Temperature of Extremity: [Left:Warm] Capillary Refill: [Left:< 3 seconds] Dependent Rubor: [Left:No] Blanched when Elevated: [Left:No] Lipodermatosclerosis: [Left:No] Toe Nail Assessment Left: Right: Thick: No Discolored: No Deformed: No Improper Length and Hygiene: No OWENS, HARA ([id number]) Electronic Signature(s) Signed: 01/08/2017 4:57:33 PM By: [name], [name], RN, [name], [name], [name] Entered By: [name], [name], RN, [name], [name] on 01/08/2017 08:34:58 [name] ([id number]) -------------------------------------------------------------------------------- Multi Wound Chart Details Patient Name: [name] Date of Service: 01/08/2017 8:15 AM Medical Record Number: [id number] Patient Account Number: [id number] Date of Birth/Sex: [birth year] (35 y.o. Male) Treating RN: [name] Primary Care Jonothan Heberle: [name] Other Clinician: Referring Icarus Partch: [name] Treating Kejon Feild/Extender: Ricard Dillon Weeks in Treatment: 5 Vital Signs Height(in): 71 Pulse(bpm): 74 Weight(lbs): 338 Blood Pressure 154/75 (mmHg): Body Mass Index(BMI): 47 Temperature(F): 98.4 Respiratory Rate 16 (breaths/min): Photos: [N/A:N/A] Wound Location: Left Lower Leg - Lateral N/A N/A Wounding Event: Gradually Appeared N/A N/A Primary Etiology: Venous Leg Ulcer N/A N/A Comorbid History: Sleep Apnea, N/A N/A Hypertension, Colitis Date Acquired: 11/18/2015 N/A N/A Weeks of Treatment: 5 N/A N/A Wound Status:  Open N/A N/A Measurements L x W x D 4.5x3x0.4 N/A N/A (cm) Area (cm) : 10.603 N/A N/A Volume (cm) : 4.241 N/A N/A % Reduction in Area: -116.00% N/A N/A % Reduction in Volume: -8.00% N/A N/A Classification: Full Thickness Without N/A N/A Exposed Support Structures Exudate Amount: Large N/A N/A Exudate Type: Serosanguineous N/A N/A Exudate Color: red, brown N/A N/A Wound Margin: Flat and Intact N/A N/A Granulation Amount: Small (1-33%) N/A N/A Necrotic Amount: Large (67-100%) N/A N/A Exposed Structures: N/A N/A Jeudy, [name] ([id number]) Fat Layer (Subcutaneous Tissue) Exposed: Yes Fascia: No Tendon: No Muscle: No Joint: No Bone: No Epithelialization: None N/A N/A Debridement: Chemical/enzymatic - N/A N/A Non-Selective Pre-procedure 08:45 N/A N/A Verification/Time Out Taken: Procedural Pain: 0 N/A N/A Post Procedural Pain: 0 N/A N/A Debridement Treatment Procedure was tolerated N/A N/A Response: well Post Debridement 4x3x0.4 N/A N/A Measurements L x W x D (cm) Post Debridement 3.77 N/A N/A Volume: (cm) Periwound Skin Texture: Excoriation: Yes N/A  N/A Induration: Yes Callus: No Crepitus: No Rash: No Scarring: No Periwound Skin Maceration: No N/A N/A Moisture: Dry/Scaly: No Periwound Skin Color: Erythema: Yes N/A N/A Rubor: Yes Atrophie Blanche: No Cyanosis: No Ecchymosis: No Hemosiderin Staining: No Mottled: No Pallor: No Erythema Location: Circumferential N/A N/A Temperature: Hot N/A N/A Tenderness on No N/A N/A Palpation: Wound Preparation: Ulcer Cleansing: N/A N/A Rinsed/Irrigated with Saline Topical Anesthetic Applied: Other: Lidocaine 4% Procedures Performed: Debridement N/A N/A Fortunato, Solan ([id number]) Treatment Notes Wound #1 (Left, Lateral Lower Leg) 1. Cleansed with: Clean wound with Normal Saline 2. Anesthetic Topical Lidocaine 4% cream to wound bed prior to debridement 4. Dressing Applied: Santyl Ointment 7. Secured with Tape Notes Manufacturing systems engineer) Signed: 01/08/2017 5:02:24 PM By: Linton Ham MD Entered By: Linton Ham on 01/08/2017 09:17:53 [name] ([id number]) -------------------------------------------------------------------------------- [place] Details Patient Name: [name] Date of Service: 01/08/2017 8:15 AM Medical Record Number: [id number] Patient Account Number: [id number] Date of Birth/Sex: December 17, 1980 (35 y.o. Male) Treating RN: [name] Primary Care Elene Downum: [name] Other Clinician: Referring Jessicalynn Deshong: [name] Treating Nastacia Raybuck/Extender: Tito Dine in Treatment: 5 Active Inactive ` Orientation to the Wound Care Program Nursing Diagnoses: Knowledge deficit related to the wound healing center program Goals: Patient/caregiver will verbalize understanding of the [place] Program Date Initiated: 12/11/2016 Target Resolution Date: 03/13/2017 Goal Status: Active Interventions: Provide education on orientation to the wound center Notes: ` Venous Leg Ulcer Nursing  Diagnoses: Knowledge deficit related to disease process and management Potential for venous Insuffiency (use before diagnosis confirmed) Goals: Non-invasive venous studies are completed as ordered Date Initiated: 12/11/2016 Target Resolution Date: 03/13/2017 Goal Status: Active Patient will maintain optimal edema control Date Initiated: 12/11/2016 Target Resolution Date: 03/13/2017 Goal Status: Active Patient/caregiver will verbalize understanding of disease process and disease management Date Initiated: 12/11/2016 Target Resolution Date: 03/13/2017 Goal Status: Active Verify adequate tissue perfusion prior to therapeutic compression application Date Initiated: 12/11/2016 Target Resolution Date: 03/13/2017 Goal Status: Active Mask, Slayter ([id number]) Interventions: Assess peripheral edema status every visit. Compression as ordered Provide education on venous insufficiency Treatment Activities: Therapeutic compression applied : 12/11/2016 Notes: ` Wound/Skin Impairment Nursing Diagnoses: Impaired tissue integrity Knowledge deficit related to ulceration/compromised skin integrity Goals: Patient/caregiver will verbalize understanding of skin care regimen Date Initiated: 12/11/2016 Target Resolution Date: 03/13/2017 Goal Status: Active Ulcer/skin breakdown will have a volume reduction of 30% by week 4 Date Initiated: 12/11/2016 Target Resolution Date: 03/13/2017 Goal Status: Active Ulcer/skin breakdown will have a volume reduction of  50% by week 8 Date Initiated: 12/11/2016 Target Resolution Date: 03/13/2017 Goal Status: Active Ulcer/skin breakdown will have a volume reduction of 80% by week 12 Date Initiated: 12/11/2016 Target Resolution Date: 03/13/2017 Goal Status: Active Ulcer/skin breakdown will heal within 14 weeks Date Initiated: 12/11/2016 Target Resolution Date: 03/13/2017 Goal Status: Active Interventions: Assess patient/caregiver ability to obtain necessary  supplies Assess patient/caregiver ability to perform ulcer/skin care regimen upon admission and as needed Assess ulceration(s) every visit Provide education on ulcer and skin care Treatment Activities: Skin care regimen initiated : 12/11/2016 Topical wound management initiated : 12/11/2016 Notes: CHEY, CHO ([id number]) Electronic Signature(s) Signed: 01/08/2017 4:57:33 PM By: [name], [name], RN, [name], [name], [name] Entered By: [name], [name], RN, [name], [name] on 01/08/2017 08:35:16 [name] ([id number]) -------------------------------------------------------------------------------- Pain Assessment Details Patient Name: [name] Date of Service: 01/08/2017 8:15 AM Medical Record Number: [id number] Patient Account Number: [id number] Date of Birth/Sex: [birth year] (35 y.o. Male) Treating RN: [name] Primary Care Jackie Littlejohn: [name] Other Clinician: Referring Adia Crammer: [name] Treating Celestia Duva/Extender: Ricard Dillon Weeks in Treatment: 5 Active Problems Location of Pain Severity and Description of Pain Patient Has Paino No Site Locations With Dressing Change: No Pain Management and Medication Current Pain Management: Goals for Pain Management Topical or injectable lidocaine is offered to patient for acute pain when surgical debridement is performed. If needed, Patient is instructed to use over the counter pain medication for the following 24-48 hours after debridement. Wound care MDs do not prescribed pain medications. Patient has chronic pain or uncontrolled pain. Patient has been instructed to make an appointment with their Primary Care Physician for pain management. Electronic Signature(s) Signed: 01/08/2017 4:57:33 PM By: [name], [name], RN, [name], [name], [name] Entered By: [name], [name], RN, [name], [name] on 01/08/2017 08:26:48 [name] ([id number]) -------------------------------------------------------------------------------- Patient/Caregiver Education  Details Patient Name: [name] Date of Service: 01/08/2017 8:15 AM Medical Record Number: [id number] Patient Account Number: [id number] Date of Birth/Gender: [birth year] (35 y.o. Male) Treating RN: [name] Primary Care Physician: [name] Other Clinician: Referring Physician: [name] Treating Physician/Extender: Tito Dine in Treatment: 5 Education Assessment Education Provided To: Patient Education Topics Provided Wound/Skin Impairment: Handouts: Caring for Your Ulcer, Other: daily wound care as prescribed Methods: Demonstration, Explain/Verbal Responses: State content correctly Electronic Signature(s) Signed: 01/08/2017 4:57:33 PM By: [name], [name], RN, [name], [name], [name] Entered By: [name], [name], RN, [name], [name] on 01/08/2017 08:52:51 [name] ([id number]) -------------------------------------------------------------------------------- Wound Assessment Details Patient Name: [name] Date of Service: 01/08/2017 8:15 AM Medical Record Number: [id number] Patient Account Number: [id number] Date of Birth/Sex: [birth year] (35 y.o. Male) Treating RN: [name] Primary Care Kao Berkheimer: [name] Other Clinician: Referring Anahi Belmar: [name] Treating Kamari [name]/Extender: Ricard Dillon Weeks in Treatment: 5 Wound Status Wound Number: 1 Primary Etiology: Venous Leg Ulcer Wound Location: Left Lower Leg - Lateral Wound Status: Open Wounding Event: Gradually Appeared Comorbid Sleep Apnea, Hypertension, History: Colitis Date Acquired: 11/18/2015 Weeks Of Treatment: 5 Clustered Wound: No Photos Wound Measurements Length: (cm) 4.5 Width: (cm) 3 Depth: (cm) 0.4 Area: (cm) 10.603 Volume: (cm) 4.241 % Reduction in Area: -116% % Reduction in Volume: -8% Epithelialization: None Tunneling: No Undermining: No Wound Description Full Thickness Without Exposed Classification: Support Structures Wound Margin: Flat and  Intact Exudate Large Amount: Exudate Type: Serosanguineous Exudate Color: red, brown Foul Odor After Cleansing: No Slough/Fibrino Yes Wound Bed Granulation Amount: Small (1-33%) Exposed Structure Necrotic Amount: Large (67-100%) Fascia Exposed: No Necrotic Quality: Adherent Slough Fat Layer (Subcutaneous Tissue)  Exposed: Yes Tendon Exposed: No Muscle Exposed: No Joint Exposed: No Castellana, Ihan ([id number]) Bone Exposed: No Periwound Skin Texture Texture Color No Abnormalities Noted: No No Abnormalities Noted: No Callus: No Atrophie Blanche: No Crepitus: No Cyanosis: No Excoriation: Yes Ecchymosis: No Induration: Yes Erythema: Yes Rash: No Erythema Location: Circumferential Scarring: No Hemosiderin Staining: No Mottled: No Moisture Pallor: No No Abnormalities Noted: No Rubor: Yes Dry / Scaly: No Maceration: No Temperature / Pain Temperature: Hot Wound Preparation Ulcer Cleansing: Rinsed/Irrigated with Saline Topical Anesthetic Applied: Other: Lidocaine 4%, Electronic Signature(s) Signed: 01/08/2017 4:57:33 PM By: [name], [name], RN, [name], [name], [name] Entered By: [name], [name], RN, [name], [name] on 01/08/2017 08:32:53 [name] ([id number]) -------------------------------------------------------------------------------- [place] Details Patient Name: [name] Date of Service: 01/08/2017 8:15 AM Medical Record Number: [id number] Patient Account Number: [id number] Date of Birth/Sex: 1980-10-02 (35 y.o. Male) Treating RN: [name] Primary Care Roseann Kees: [name] Other Clinician: Referring Kyden Potash: [name] Treating Miley Lindon/Extender: Tito Dine in Treatment: 5 Vital Signs Time Taken: 08:26 Temperature (F): 98.4 Height (in): 71 Pulse (bpm): 74 Weight (lbs): 338 Respiratory Rate (breaths/min): 16 Body Mass Index (BMI): 47.1 Blood Pressure (mmHg): 154/75 Reference Range: 80 - 120 mg / dl Electronic Signature(s) Signed: 01/08/2017  4:57:33 PM By: [name], [name], RN, [name], [name], [name] Entered By: [name], [name], RN, [name], [name] on 01/08/2017 08:28:01

## 2017-01-09 NOTE — Progress Notes (Signed)
DAQUARIUS, DUBEAU ([id number]) Visit Report for 01/08/2017 Debridement Details Patient Name: [name], [name] Date of Service: 01/08/2017 8:15 AM Medical Record Number: [id number] Patient Account Number: [id number] Date of Birth/Sex: [birth year] (36 y.o. Male) Treating RN: Huel Coventry Primary Care Provider: [name] Other Clinician: Referring Provider: [name] Treating Provider/Extender: Altamese Vivian in Treatment: 5 Debridement Performed for Wound #1 Left,Lateral Lower Leg Assessment: Performed By: Physician Maxwell Caul, MD Debridement: Chemical/enzymatic Debridement Non-Selective Description: Pre-procedure Verification/Time Out Yes - 08:45 Taken: Start Time: 08:45 End Time: 08:45 Procedural Pain: 0 Post Procedural Pain: 0 Response to Treatment: Procedure was tolerated well Post Debridement Measurements of Total Wound Length: (cm) 4 Width: (cm) 3 Depth: (cm) 0.4 Volume: (cm) 3.77 Character of Wound/Ulcer Post Improved Debridement: Post Procedure Diagnosis Same as Pre-procedure Electronic Signature(s) Signed: 01/08/2017 4:57:33 PM By: Elliot Gurney, [name], RN, [name], [name], [name] Signed: 01/08/2017 5:02:24 PM By: Baltazar Najjar MD Entered By: Elliot Gurney, [name], RN, [name], [name] on 01/08/2017 08:46:06 DELANCE, WEIDE ([id number]) -------------------------------------------------------------------------------- HPI Details Patient Name: [name] Date of Service: 01/08/2017 8:15 AM Medical Record Number: [id number] Patient Account Number: [id number] Date of Birth/Sex: 1981/04/19 (36 y.o. Male) (36 y.o. Male) Treating RN: Huel Coventry Primary Care Provider: [name] Other Clinician: Referring Provider: [name] Treating Provider/Extender: Maxwell Caul Weeks in Treatment: 5 History of Present Illness HPI Description: 12/04/16; 36 year old man who comes into the clinic today for review of a wound on the posterior left calf. He tells me that is been there for about a  year. He is not a diabetic he does smoke half a pack per day. He was seen in the ER on 11/20/16 felt to have cellulitis around the wound and was given clindamycin. An x-ray did not show osteomyelitis. The patient initially tells me that he has a milk allergy that sets off a pruritic itching rash on his lower legs which she scratches incessantly and he thinks that's what may have set up the wound. He has been using various topical antibiotics and ointments without any effect. He works in a [employer]ing [employer] and is on his feet all day. He does not have a prior history of wounds however he does have the rash on both lower legs the right arm and the ventral aspect of his left arm. These are excoriations and clearly have had scratching however there are of macular looking areas on both legs including a substantial larger area on the right leg. This does not have an underlying open area. There is no blistering. 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 He seemed to recognize this. 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 He is not in any treatment for this. He's had no recent travel 12/11/16; the patient arrives today with his wound and roughly the same condition we've been using silver alginate this is a deep punched out wound with some surrounding erythema but no tenderness. Biopsy I did did not show confirmed pyoderma gangrenosum suggested nonspecific inflammation and vasculitis but does not provide an actual description of what was seen by the pathologist. I'm really not able to understand this We have also received information from the patient's dermatologist in [place] notes from April 2016. This was a doctor Agarwal-antal. 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  He was  prescribed topical steroid high potency under occlusion which helped but at this point the patient did not have a deep punched out wound. 12/18/16; the patient's wound is larger in terms of surface area however this surface looks better and there is less depth. The surrounding erythema also is better. The patient states that the wrap we put on came off 2 days ago when he has been using his compression stockings. He we are in the process of getting a dermatology consult. 12/26/16 on evaluation today patient's left lower extremity wound shows evidence of infection with surrounding erythema noted. He has been tolerating the dressing changes but states that he has noted more discomfort. There is a larger area of erythema surrounding the wound. No fevers, chills, nausea, or vomiting noted at this time. With that being said the wound still does have slough covering the surface. He is not allergic to any medication that he is aware of at this point. In regard to his right lower extremity he had several regions that are erythematous and pruritic he wonders if there's anything we can do to help that. TRACER, PALMS ([id number]) 01/02/17 I reviewed patient's wound culture which was obtained his visit last week. He was placed on doxycycline at that point. Unfortunately that does not appear to be an antibiotic that would likely help with the situation however the pseudomonas noted on culture is sensitive to Cipro. Also unfortunately patient's wound seems to have a large compared to last week's evaluation. Not severely so but there are definitely increased measurements in general. He is continuing to have discomfort as well he writes this to be a seven out of 10. In fact he would prefer me not to perform any debridement today due to the fact that he is having discomfort and considering he has an active infection on the little reluctant to do so anyway. No fevers, chills, nausea, or vomiting noted at this  time. 01/08/17; patient seems dermatology on September 5. I suspect dermatology will want the slides from the biopsy I did sent to their pathologist. I'm not sure if there is a way we can expedite that. In any case the culture I did before I left on vacation 3 weeks ago showed Pseudomonas he was given 10 days of Cipro and per her description of her intake nurses is actually somewhat better this week although the wound is quite a bit bigger than I remember the last time I saw this. He still has 3 more days of Cipro Electronic Signature(s) Signed: 01/08/2017 5:02:24 PM By: Baltazar Najjar MD Entered By: Baltazar Najjar on 01/08/2017 09:19:11 YOSIAH, HAIRR ([id number]) -------------------------------------------------------------------------------- Physical Exam Details Patient Name: [name] Date of Service: 01/08/2017 8:15 AM Medical Record Number: [id number] Patient Account Number: [id number] Date of Birth/Sex: [birth year] (36 y.o. Male) Treating RN: Huel Coventry Primary Care Provider: [name] Other Clinician: Referring Provider: [name] Treating Provider/Extender: Maxwell Caul Weeks in Treatment: 5 Constitutional Patient is hypertensive.. Pulse regular and within target range for patient.Marland Kitchen Respirations regular, non-labored and within target range.. Temperature is normal and within the target range for the patient.Marland Kitchen appears in no distress. Eyes Conjunctivae clear. No discharge. Respiratory Respiratory effort is easy and symmetric bilaterally. Rate is normal at rest and on room air.. Cardiovascular Pedal pulses palpable and strong bilaterally.. Lymphatic None palpable in the popliteal or inguinal area. Notes Wound exam; apparently the erythema is quite a bit better than last week although he still has significant erythema. The wound itself is  bigger than I remember and has superficial slough that I wanted to do debridement however the patient was reluctant. There  is no significant surrounding tenderness or crepitus. Electronic Signature(s) Signed: 01/08/2017 5:02:24 PM By: Baltazar Najjar MD Entered By: Baltazar Najjar on 01/08/2017 09:20:55 [name] ([id number]) -------------------------------------------------------------------------------- Physician Orders Details Patient Name: [name] Date of Service: 01/08/2017 8:15 AM Medical Record Number: [id number] Patient Account Number: [id number] Date of Birth/Sex: 1980/09/18 (36 y.o. Male) Treating RN: Huel Coventry Primary Care Provider: [name] Other Clinician: Referring Provider: [name] Treating Provider/Extender: Altamese Everson in Treatment: 5 Verbal / Phone Orders: No Diagnosis Coding Wound Cleansing Wound #1 Left,Lateral Lower Leg o May Shower, gently pat wound dry prior to applying new dressing. o No tub bath. Anesthetic Wound #1 Left,Lateral Lower Leg o Topical Lidocaine 4% cream applied to wound bed prior to debridement - In Clinic Skin Barriers/Peri-Wound Care Wound #1 Left,Lateral Lower Leg o Barrier cream o Triamcinolone Acetonide Ointment Primary Wound Dressing o Santyl Ointment Secondary Dressing Wound #1 Left,Lateral Lower Leg o XtraSorb Dressing Change Frequency Wound #1 Left,Lateral Lower Leg o Change dressing every day. Follow-up Appointments Wound #1 Left,Lateral Lower Leg o Return Appointment in 1 week. Edema Control Wound #1 Left,Lateral Lower Leg o Patient to wear own compression stockings o Elevate legs to the level of the heart and pump ankles as often as possible Additional Orders / Instructions Harter, [name] ([id number]) Wound #1 Left,Lateral Lower Leg o Increase protein intake. o Activity as tolerated Medications-please add to medication list. Wound #1 Left,Lateral Lower Leg o P.O. Antibiotics - Continnue Abx o Topical Antibiotic - TAC o Santyl Enzymatic Ointment Electronic  Signature(s) Signed: 01/08/2017 4:57:33 PM By: Elliot Gurney, [name], RN, [name], [name], [name] Signed: 01/08/2017 5:02:24 PM By: Baltazar Najjar MD Entered By: Elliot Gurney, [name], RN, [name], [name] on 01/08/2017 08:44:36 ZANDEN, COLVER ([id number]) -------------------------------------------------------------------------------- Problem List Details Patient Name: [name] Date of Service: 01/08/2017 8:15 AM Medical Record Number: [id number] Patient Account Number: [id number] Date of Birth/Sex: [birth year] (36 y.o. 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 Male) Treating RN: Huel Coventry Primary Care Provider: [name] Other Clinician: Referring Provider: [name] Treating Provider/Extender: Maxwell Caul Weeks in Treatment: 5 Subjective History of Present Illness (HPI) 12/04/16; 36 year old man who comes into the clinic today for review of a wound on the posterior left calf. He tells me that is been there for about a year. He is not a diabetic he does smoke half a pack per day. He was seen in the ER on 11/20/16 felt to have cellulitis around the wound and was given  clindamycin. An x-ray did not show osteomyelitis. The patient initially tells me that he has a milk allergy that sets off a pruritic itching rash on his lower legs which she scratches incessantly and he thinks that's what may have set up the wound. He has been using various topical antibiotics and ointments without  any effect. He works in a [employer]ing [employer] and is on his feet all day. He does not have a prior history of wounds however he does have the rash on both lower legs the right arm and the ventral aspect of his left arm. These are excoriations and clearly have had scratching however there are of macular looking areas on both legs including a substantial larger area on the right leg. This does not have an underlying open area. There is no blistering. 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 He seemed to recognize this. 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 He is not in any treatment for this. He's had no recent travel 12/11/16; the patient arrives today with his wound and roughly the same condition we've been using silver alginate this is a deep punched out wound with some surrounding erythema but no tenderness. Biopsy I did did not show confirmed pyoderma gangrenosum suggested nonspecific inflammation and vasculitis but does not provide an actual description of what was seen by the pathologist. I'm really not able to understand this We have also received information from the patient's dermatologist in [place] notes from April 2016. This was a doctor Agarwal-antal. 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 He was prescribed topical steroid high potency under occlusion which helped but at this point the patient did not have a deep punched out wound. 12/18/16;  the patient's wound is larger in terms of surface area however this surface looks better and there is less depth. The surrounding erythema also is better. The patient states that the wrap we put on came off 2 days ago when he has been using his compression stockings. He we are in the process of getting a dermatology consult. 12/26/16 on evaluation today patient's left lower extremity wound shows evidence of infection with surrounding erythema noted. He has been tolerating the dressing changes but states that he has noted more discomfort. There is a larger area of erythema surrounding the wound. No fevers, chills, nausea, or vomiting noted at this time. With that being said the wound still does have slough covering the surface. He is not allergic to any medication that he is aware of at this point. In regard to his right lower extremity he had several regions that are erythematous and pruritic he wonders if there's anything we can do to help that. TERION, HEDMAN ([id number]) 01/02/17 I reviewed patient's wound culture which was obtained his visit last week. He was placed on doxycycline at that point. Unfortunately that does not appear to be an antibiotic that would likely help with the situation however the pseudomonas noted on culture is sensitive to Cipro. Also unfortunately patient's wound seems to have a large compared to last week's evaluation. Not severely so but there are definitely increased measurements in general. He is continuing to have discomfort as well he writes this to be a seven out of 10. In fact he would prefer me not to perform any debridement today due to the fact that he is having discomfort and considering he has an active infection on the little reluctant to do so anyway. No fevers, chills, nausea, or vomiting noted at this time. 01/08/17; patient seems dermatology on September 5. I suspect dermatology will want the  slides from the biopsy I did sent to their pathologist. I'm not  sure if there is a way we can expedite that. In any case the culture I did before I left on vacation 3 weeks ago showed Pseudomonas he was given 10 days of Cipro and per her description of her intake nurses is actually somewhat better this week although the wound is quite a bit bigger than I remember the last time I saw this. He still has 3 more days of Cipro Objective Constitutional Patient is hypertensive.. Pulse regular and within target range for patient.Marland Kitchen Respirations regular, non-labored and within target range.. Temperature is normal and within the target range for the patient.Marland Kitchen appears in no distress. Vitals Time Taken: 8:26 AM, Height: 71 in, Weight: 338 lbs, BMI: 47.1, Temperature: 98.4 F, Pulse: 74 bpm, Respiratory Rate: 16 breaths/min, Blood Pressure: 154/75 mmHg. Eyes Conjunctivae clear. No discharge. Respiratory Respiratory effort is easy and symmetric bilaterally. Rate is normal at rest and on room air.. Cardiovascular Pedal pulses palpable and strong bilaterally.. Lymphatic None palpable in the popliteal or inguinal area. General Notes: Wound exam; apparently the erythema is quite a bit better than last week although he still has significant erythema. The wound itself is bigger than I remember and has superficial slough that I wanted to do debridement however the patient was reluctant. There is no significant surrounding Guarisco, Wilfred ([id number]) tenderness or crepitus. Integumentary (Hair, Skin) Wound #1 status is Open. Original cause of wound was Gradually Appeared. The wound is located on the Left,Lateral Lower Leg. The wound measures 4.5cm length x 3cm width x 0.4cm depth; 10.603cm^2 area and 4.241cm^3 volume. There is Fat Layer (Subcutaneous Tissue) Exposed exposed. There is no tunneling or undermining noted. There is a large amount of serosanguineous drainage noted. The wound margin is flat and intact. There is small (1-33%) granulation within the wound bed. There  is a large (67-100%) amount of necrotic tissue within the wound bed including Adherent Slough. The periwound skin appearance exhibited: Excoriation, Induration, Rubor, Erythema. The periwound skin appearance did not exhibit: Callus, Crepitus, Rash, Scarring, Dry/Scaly, Maceration, Atrophie Blanche, Cyanosis, Ecchymosis, Hemosiderin Staining, Mottled, Pallor. The surrounding wound skin color is noted with erythema which is circumferential. Periwound temperature was noted as Hot. 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 There was a Non-Selective Chemical/enzymatic debridement (non-viable tissue was removed) performed by Maxwell Caul, MD.. A time out was conducted at 08:45, prior to the start of the procedure. The procedure was tolerated well with a pain level of 0 throughout and a pain level of 0 following the procedure. Post Debridement Measurements: 4cm length x 3cm width x 0.4cm depth; 3.77cm^3 volume. Character of Wound/Ulcer Post Debridement is improved. Post procedure Diagnosis Wound #1: Same as Pre-Procedure Plan Wound Cleansing: Matich, Jashaun ([id number]) Wound #1 Left,Lateral Lower Leg: May Shower, gently pat wound dry prior to applying new dressing. No tub bath. Anesthetic: Wound #1 Left,Lateral Lower Leg: Topical Lidocaine 4% cream applied to wound bed prior to debridement - In Clinic Skin Barriers/Peri-Wound Care: Wound #1 Left,Lateral Lower Leg: Barrier cream Triamcinolone Acetonide Ointment Primary Wound Dressing: Santyl Ointment Secondary Dressing: Wound #1 Left,Lateral Lower Leg: XtraSorb Dressing Change Frequency: Wound #1 Left,Lateral Lower Leg: Change dressing every day. Follow-up Appointments: Wound #1 Left,Lateral Lower  Leg: Return Appointment in 1  week. Edema Control: Wound #1 Left,Lateral Lower Leg: Patient to wear own compression stockings Elevate legs to the level of the heart and pump ankles as often as possible Additional Orders / Instructions: Wound #1 Left,Lateral Lower Leg: Increase protein intake. Activity as tolerated Medications-please add to medication list.: Wound #1 Left,Lateral Lower Leg: P.O. Antibiotics - Continnue Abx Topical Antibiotic - TAC Santyl Enzymatic Ointment #1 week continue with Santyl. He is using his own compression stocking and changing the dressing #2 he will complete 3 days worth of Cipro which by description the area of erythema actually is better. #3 the original pathology suggested some form of vasculitis but provided no specific pathologic description that I could really begin to understand. I made a dermatology appointment for him this is on September 5. I suspect the pathologic slides will need to be re-reviewed #4 we are using topical steroids on him here and I'll consider that when he comes back the next time [after completion of antibiotics] OLUWADARASIMI, REDMON ([id number]) Electronic Signature(s) Signed: 01/08/2017 5:02:24 PM By: Baltazar Najjar MD Entered By: Baltazar Najjar on 01/08/2017 09:22:50 [name] ([id number]) -------------------------------------------------------------------------------- SuperBill Details Patient Name: [name] Date of Service: 01/08/2017 Medical Record Number: [id number] Patient Account Number: [id number] Date of Birth/Sex: [birth year] (36 y.o. Male) Treating RN: Huel Coventry Primary Care Provider: [name] Other Clinician: Referring Provider: [name] Treating Provider/Extender: Maxwell Caul Weeks in Treatment: 5 Diagnosis Coding ICD-10 Codes Code Description [phone] 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sis of muscle I87.2 Venous insufficiency (chronic) (peripheral) Facility  Procedures CPT4 Code: [id number] Description: 84132 - DEBRIDE W/O ANES NON SELECT Modifier: Quantity: 1 Physician Procedures CPT4 Code: 4401027 Description: 99213 - WC PHYS LEVEL 3 - EST PT ICD-10 Description Diagnosis L97.223 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 I87.2 Venous insufficiency (chronic) (peripheral) Modifier: sis of muscle Quantity: 1 Electronic Signature(s) Signed: 01/08/2017 5:02:24 PM By: Baltazar Najjar MD Entered By: Baltazar Najjar on 01/08/2017 09:23:19

## 2017-01-15 ENCOUNTER — Ambulatory Visit: Payer: 59 | Admitting: Internal Medicine

## 2017-01-21 ENCOUNTER — Encounter: Payer: 59 | Attending: Internal Medicine | Admitting: Internal Medicine

## 2017-01-21 DIAGNOSIS — S81802A Unspecified open wound, left lower leg, initial encounter: Secondary | ICD-10-CM | POA: Diagnosis not present

## 2017-01-21 DIAGNOSIS — I872 Venous insufficiency (chronic) (peripheral): Secondary | ICD-10-CM | POA: Diagnosis not present

## 2017-01-21 DIAGNOSIS — B965 Pseudomonas (aeruginosa) (mallei) (pseudomallei) as the cause of diseases classified elsewhere: Secondary | ICD-10-CM | POA: Diagnosis not present

## 2017-01-21 DIAGNOSIS — L97223 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sis of muscle: Secondary | ICD-10-CM | POA: Diagnosis not present

## 2017-01-22 ENCOUNTER — Ambulatory Visit
Admission: RE | Admit: 2017-01-22 | Discharge: 2017-01-22 | Disposition: A | Discharge status: Home / Self Care | Payer: Commercial Managed Care - PPO | Attending: Dermatology | Admitting: Dermatology

## 2017-01-22 DIAGNOSIS — R21 Rash and other nonspecific skin eruption: Principal | ICD-10-CM

## 2017-01-22 DIAGNOSIS — L280 Lichen simplex chronicus: Secondary | ICD-10-CM | POA: Diagnosis not present

## 2017-01-22 NOTE — Progress Notes (Signed)
QUINTAN, SALDIVAR ([id number]) Visit Report for 01/21/2017 Arrival Information Details Patient Name: [name], [name] Date of Service: 01/21/2017 8:15 AM Medical Record Number: [id number] Patient Account Number: [id number] Date of Birth/Sex: 1980/09/02 (36 y.o. Male) Treating RN: [name] Primary Care Saher Davee: [name] Other Clinician: Referring Azul Brumett: [name] Treating Rosaly Labarbera/Extender: Tito Dine in Treatment: 6 Visit Information History Since Last Visit Added or deleted any medications: No Patient Arrived: Ambulatory Any new allergies or adverse reactions: No Arrival Time: 08:28 Had a fall or experienced change in No Accompanied By: self activities of daily living that may affect Transfer Assistance: None risk of falls: Patient Identification Verified: Yes Signs or symptoms of abuse/neglect since last No Secondary Verification Process Yes visito Completed: Hospitalized since last visit: No Patient Requires Transmission-Based No Has Dressing in Place as Prescribed: Yes Precautions: Pain Present Now: No Patient Has Alerts: No Electronic Signature(s) Signed: 01/21/2017 5:20:16 PM By: [name], [name], RN, [name], [name], [name] Entered By: [name], [name], RN, [name], [name] on 01/21/2017 08:28:39 [name] ([id number]) -------------------------------------------------------------------------------- Clinic Level of Care Assessment Details Patient Name: [name] Date of Service: 01/21/2017 8:15 AM Medical Record Number: [id number] Patient Account Number: [id number] Date of Birth/Sex: November 03, 1980 (36 y.o. Male) Treating RN: [name] Primary Care [place] Willow: [name] Other Clinician: Referring Latanga Nedrow: [name] Treating Jesenia Spera/Extender: Tito Dine in Treatment: 6 Clinic Level of Care Assessment Items TOOL 4 Quantity Score []  - Use when only an EandM is performed on FOLLOW-UP visit 0 ASSESSMENTS - Nursing Assessment / Reassessment []  -  Reassessment of Co-morbidities (includes updates in patient status) 0 X - Reassessment of Adherence to Treatment Plan 1 5 ASSESSMENTS - Wound and Skin Assessment / Reassessment X - Simple Wound Assessment / Reassessment - one wound 1 5 []  - Complex Wound Assessment / Reassessment - multiple wounds 0 []  - Dermatologic / Skin Assessment (not related to wound area) 0 ASSESSMENTS - Focused Assessment []  - Circumferential Edema Measurements - multi extremities 0 []  - Nutritional Assessment / Counseling / Intervention 0 []  - Lower Extremity Assessment (monofilament, tuning fork, pulses) 0 []  - Peripheral Arterial Disease Assessment (using hand held doppler) 0 ASSESSMENTS - Ostomy and/or Continence Assessment and Care []  - Incontinence Assessment and Management 0 []  - Ostomy Care Assessment and Management (repouching, etc.) 0 PROCESS - Coordination of Care X - Simple Patient / Family Education for ongoing care 1 15 []  - Complex (extensive) Patient / Family Education for ongoing care 0 X - Staff obtains Programmer, systems, Records, Test Results / Process Orders 1 10 []  - Staff telephones HHA, Nursing Homes / Clarify orders / etc 0 []  - Routine Transfer to another Facility (non-emergent condition) 0 Pires, Nahshon ([id number]) []  - Routine Hospital Admission (non-emergent condition) 0 []  - New Admissions / Biomedical engineer / Ordering NPWT, Apligraf, etc. 0 []  - Emergency Hospital Admission (emergent condition) 0 X - Simple Discharge Coordination 1 10 []  - Complex (extensive) Discharge Coordination 0 PROCESS - Special Needs []  - Pediatric / Minor Patient Management 0 []  - Isolation Patient Management 0 []  - Hearing / Language / Visual special needs 0 []  - Assessment of Community assistance (transportation, D/C planning, etc.) 0 []  - Additional assistance / Altered mentation 0 []  - Support Surface(s) Assessment (bed, cushion, seat, etc.) 0 INTERVENTIONS - Wound Cleansing / Measurement X - Simple  Wound Cleansing - one wound 1 5 []  - Complex Wound Cleansing - multiple wounds 0 X - Wound Imaging (photographs - any number of wounds)  1 5 []  - Wound Tracing (instead of photographs) 0 X - Simple Wound Measurement - one wound 1 5 []  - Complex Wound Measurement - multiple wounds 0 INTERVENTIONS - Wound Dressings []  - Small Wound Dressing one or multiple wounds 0 X - Medium Wound Dressing one or multiple wounds 1 15 []  - Large Wound Dressing one or multiple wounds 0 []  - Application of Medications - topical 0 []  - Application of Medications - injection 0 INTERVENTIONS - Miscellaneous []  - External ear exam 0 Milanese, Christpher ([id number]) []  - Specimen Collection (cultures, biopsies, blood, body fluids, etc.) 0 []  - Specimen(s) / Culture(s) sent or taken to Lab for analysis 0 []  - Patient Transfer (multiple staff / [name] / Similar devices) 0 []  - Simple Staple / Suture removal (25 or less) 0 []  - Complex Staple / Suture removal (26 or more) 0 []  - Hypo / Hyperglycemic Management (close monitor of Blood Glucose) 0 []  - Ankle / Brachial Index (ABI) - do not check if billed separately 0 X - Vital Signs 1 5 Has the patient been seen at the hospital within the last three years: Yes Total Score: 80 Level Of Care: New/Established - Level 3 Electronic Signature(s) Signed: 01/21/2017 5:20:16 PM By: [name], [name], RN, [name], [name], [name] Entered By: [name], [name], RN, [name], [name] on 01/21/2017 08:43:21 [name] ([id number]) -------------------------------------------------------------------------------- Encounter Discharge Information Details Patient Name: [name] Date of Service: 01/21/2017 8:15 AM Medical Record Number: [id number] Patient Account Number: [id number] Date of Birth/Sex: [birth year] (36 y.o. Male) Treating RN: [name] Primary Care Zakkiyya Barno: [name] Other Clinician: Referring Jameil Whitmoyer: [name] Treating Romi Rathel/Extender: Tito Dine in Treatment:  6 Encounter Discharge Information Items Discharge Pain Level: 0 Discharge Condition: Stable Ambulatory Status: Ambulatory Discharge Destination: Home Transportation: Private Auto Accompanied By: self Schedule Follow-up Appointment: Yes Medication Reconciliation completed and provided to Patient/Care Yes Kathya Wilz: Provided on Clinical Summary of Care: 01/21/2017 Form Type Recipient Paper Patient RL Electronic Signature(s) Signed: 01/21/2017 5:20:16 PM By: [name], [name], RN, [name], [name], [name] Entered By: [name], [name], RN, [name], [name] on 01/21/2017 08:46:15 [name] ([id number]) -------------------------------------------------------------------------------- Lower Extremity Assessment Details Patient Name: [name] Date of Service: 01/21/2017 8:15 AM Medical Record Number: [id number] Patient Account Number: [id number] Date of Birth/Sex: [birth year] (36 y.o. Male) Treating RN: [name] Primary Care Benaiah Behan: [name] Other Clinician: Referring Jazmarie Biever: [name] Treating Elisheba Mcdonnell/Extender: Ricard Dillon Weeks in Treatment: 6 Edema Assessment Assessed: [Left: No] [Right: No] E[Left: dema] [Right: :] Calf Left: Right: Point of Measurement: 33 cm From Medial Instep 51.5 cm cm Ankle Left: Right: Point of Measurement: 11 cm From Medial Instep 30 cm cm Vascular Assessment Pulses: Dorsalis Pedis Palpable: [Left:Yes] Posterior Tibial Extremity colors, hair growth, and conditions: Extremity Color: [Left:Red] Hair Growth on Extremity: [Left:Yes] Temperature of Extremity: [Left:Warm] Capillary Refill: [Left:< 3 seconds] Toe Nail Assessment Left: Right: Thick: No Discolored: No Deformed: No Improper Length and Hygiene: No Electronic Signature(s) Signed: 01/21/2017 5:20:16 PM By: [name], [name], RN, [name], [name], [name] Entered By: [name], [name], RN, [name], [name] on 01/21/2017 08:33:21 [name]  ([id number]) -------------------------------------------------------------------------------- Multi Wound Chart Details Patient Name: [name] Date of Service: 01/21/2017 8:15 AM Medical Record Number: [id number] Patient Account Number: [id number] Date of Birth/Sex: 06/07/1980 (36 y.o. Male) Treating RN: [name] Primary Care Airyonna Franklyn: [name] Other Clinician: Referring Tyr Franca: [name] Treating Maria Gallicchio/Extender: Ricard Dillon Weeks in Treatment: 6 Vital Signs Height(in): 71 Pulse(bpm): 76 Weight(lbs): 338 Blood Pressure 135/77 (  mmHg): Body Mass Index(BMI): 47 Temperature(F): 98.4 Respiratory Rate 16 (breaths/min): Photos: [N/A:N/A] Wound Location: Left Lower Leg - Lateral N/A N/A Wounding Event: Gradually Appeared N/A N/A Primary Etiology: Vasculitis N/A N/A Comorbid History: Sleep Apnea, N/A N/A Hypertension, Colitis Date Acquired: 11/18/2015 N/A N/A Weeks of Treatment: 6 N/A N/A Wound Status: Open N/A N/A Measurements L x W x D 4.5x3x0.3 N/A N/A (cm) Area (cm) : 10.603 N/A N/A Volume (cm) : 3.181 N/A N/A % Reduction in Area: -116.00% N/A N/A % Reduction in Volume: 19.00% N/A N/A Classification: Full Thickness Without N/A N/A Exposed Support Structures Exudate Amount: Large N/A N/A Exudate Type: Serosanguineous N/A N/A Exudate Color: red, brown N/A N/A Wound Margin: Flat and Intact N/A N/A Granulation Amount: Small (1-33%) N/A N/A Necrotic Amount: Large (67-100%) N/A N/A Exposed Structures: N/A N/A Stokke, Ahlijah ([id number]) Fat Layer (Subcutaneous Tissue) Exposed: Yes Fascia: No Tendon: No Muscle: No Joint: No Bone: No Epithelialization: None N/A N/A Periwound Skin Texture: Excoriation: Yes N/A N/A Induration: Yes Callus: No Crepitus: No Rash: No Scarring: No Periwound Skin Maceration: No N/A N/A Moisture: Dry/Scaly: No Periwound Skin Color: Erythema: Yes N/A N/A Rubor: Yes Atrophie Blanche: No Cyanosis:  No Ecchymosis: No Hemosiderin Staining: No Mottled: No Pallor: No Erythema Location: Circumferential N/A N/A Temperature: Hot N/A N/A Tenderness on No N/A N/A Palpation: Wound Preparation: Ulcer Cleansing: N/A N/A Rinsed/Irrigated with Saline Topical Anesthetic Applied: Other: Lidocaine 4% Treatment Notes Electronic Signature(s) Signed: 01/22/2017 4:30:21 AM By: Linton Ham MD Entered By: Linton Ham on 01/21/2017 08:41:42 [name] ([id number]) -------------------------------------------------------------------------------- Multi-Disciplinary Care Plan Details Patient Name: [name] Date of Service: 01/21/2017 8:15 AM Medical Record Number: [id number] Patient Account Number: [id number] Date of Birth/Sex: [birth year] (36 y.o. Male) Treating RN: [name] Primary Care Ermagene Saidi: [name] Other Clinician: Referring Avyukt [name]: [name] Treating Emelynn Rance/Extender: Tito Dine in Treatment: 6 Active Inactive ` Orientation to the Wound Care Program Nursing Diagnoses: Knowledge deficit related to the wound healing center program Goals: Patient/caregiver will verbalize understanding of the [place] Program Date Initiated: 12/11/2016 Target Resolution Date: 03/13/2017 Goal Status: Active Interventions: Provide education on orientation to the wound center Notes: ` Venous Leg Ulcer Nursing Diagnoses: Knowledge deficit related to disease process and management Potential for venous Insuffiency (use before diagnosis confirmed) Goals: Non-invasive venous studies are completed as ordered Date Initiated: 12/11/2016 Target Resolution Date: 03/13/2017 Goal Status: Active Patient will maintain optimal edema control Date Initiated: 12/11/2016 Target Resolution Date: 03/13/2017 Goal Status: Active Patient/caregiver will verbalize understanding of disease process and disease management Date Initiated: 12/11/2016 Target Resolution Date:  03/13/2017 Goal Status: Active Verify adequate tissue perfusion prior to therapeutic compression application Date Initiated: 12/11/2016 Target Resolution Date: 03/13/2017 Goal Status: Active Zou, Zyeir ([id number]) Interventions: Assess peripheral edema status every visit. Compression as ordered Provide education on venous insufficiency Treatment Activities: Therapeutic compression applied : 12/11/2016 Notes: ` Wound/Skin Impairment Nursing Diagnoses: Impaired tissue integrity Knowledge deficit related to ulceration/compromised skin integrity Goals: Patient/caregiver will verbalize understanding of skin care regimen Date Initiated: 12/11/2016 Target Resolution Date: 03/13/2017 Goal Status: Active Ulcer/skin breakdown will have a volume reduction of 30% by week 4 Date Initiated: 12/11/2016 Target Resolution Date: 03/13/2017 Goal Status: Active Ulcer/skin breakdown will have a volume reduction of 50% by week 8 Date Initiated: 12/11/2016 Target Resolution Date: 03/13/2017 Goal Status: Active Ulcer/skin breakdown will have a volume reduction of 80% by week 12 Date Initiated: 12/11/2016 Target Resolution Date: 03/13/2017 Goal Status: Active Ulcer/skin breakdown will heal within 14 weeks  Date Initiated: 12/11/2016 Target Resolution Date: 03/13/2017 Goal Status: Active Interventions: Assess patient/caregiver ability to obtain necessary supplies Assess patient/caregiver ability to perform ulcer/skin care regimen upon admission and as needed Assess ulceration(s) every visit Provide education on ulcer and skin care Treatment Activities: Skin care regimen initiated : 12/11/2016 Topical wound management initiated : 12/11/2016 Notes: JORDANI, NUNN ([id number]) Electronic Signature(s) Signed: 01/21/2017 5:20:16 PM By: [name], [name], RN, [name], [name], [name] Entered By: [name], [name], RN, [name], [name] on 01/21/2017 08:34:55 [name]  ([id number]) -------------------------------------------------------------------------------- Pain Assessment Details Patient Name: [name] Date of Service: 01/21/2017 8:15 AM Medical Record Number: [id number] Patient Account Number: [id number] Date of Birth/Sex: March 31, 1981 (36 y.o. Male) Treating RN: [name] Primary Care Santos Hardwick: [name] Other Clinician: Referring Qusai Kem: [name] Treating Mirenda Baltazar/Extender: Ricard Dillon Weeks in Treatment: 6 Active Problems Location of Pain Severity and Description of Pain Patient Has Paino No Site Locations With Dressing Change: No Pain Management and Medication Current Pain Management: Electronic Signature(s) Signed: 01/21/2017 5:20:16 PM By: [name], [name], RN, [name], [name], [name] Entered By: [name], [name], RN, [name], [name] on 01/21/2017 82:50:53 [name] ([id number]) -------------------------------------------------------------------------------- Patient/Caregiver Education Details Patient Name: [name] Date of Service: 01/21/2017 8:15 AM Medical Record Number: [id number] Patient Account Number: [id number] Date of Birth/Gender: 08/28/1980 (36 y.o. Male) Treating RN: [name] Primary Care Physician: [name] Other Clinician: Referring Physician: [name] Treating Physician/Extender: Tito Dine in Treatment: 6 Education Assessment Education Provided To: Patient Education Topics Provided Wound/Skin Impairment: Handouts: Caring for Your Ulcer, Other: wound care as prescribed Methods: Demonstration Responses: State content correctly Electronic Signature(s) Signed: 01/21/2017 5:20:16 PM By: [name], [name], RN, [name], [name], [name] Entered By: [name], [name], RN, [name], [name] on 01/21/2017 08:46:39 [name] ([id number]) -------------------------------------------------------------------------------- Wound Assessment Details Patient Name: [name] Date of Service: 01/21/2017 8:15 AM Medical  Record Number: [id number] Patient Account Number: [id number] Date of Birth/Sex: 11/13/1980 (36 y.o. Male) Treating RN: [name] Primary Care Ceasia Elwell: [name] Other Clinician: Referring Ell Tiso: [name] Treating Shandora Koogler/Extender: Ricard Dillon Weeks in Treatment: 6 Wound Status Wound Number: 1 Primary Etiology: Vasculitis Wound Location: Left Lower Leg - Lateral Wound Status: Open Wounding Event: Gradually Appeared Comorbid Sleep Apnea, Hypertension, History: Colitis Date Acquired: 11/18/2015 Weeks Of Treatment: 6 Clustered Wound: No Photos Wound Measurements Length: (cm) 4.5 Width: (cm) 3 Depth: (cm) 0.3 Area: (cm) 10.603 Volume: (cm) 3.181 % Reduction in Area: -116% % Reduction in Volume: 19% Epithelialization: None Tunneling: No Undermining: No Wound Description Full Thickness Without Exposed Classification: Support Structures Wound Margin: Flat and Intact Exudate Large Amount: Exudate Type: Serosanguineous Exudate Color: red, brown Foul Odor After Cleansing: No Slough/Fibrino Yes Wound Bed Granulation Amount: Small (1-33%) Exposed Structure Necrotic Amount: Large (67-100%) Fascia Exposed: No Necrotic Quality: Adherent Slough Fat Layer (Subcutaneous Tissue) Exposed: Yes Tendon Exposed: No Muscle Exposed: No Joint Exposed: No Lurry, Katelyn ([id number]) Bone Exposed: No Periwound Skin Texture Texture Color No Abnormalities Noted: No No Abnormalities Noted: No Callus: No Atrophie Blanche: No Crepitus: No Cyanosis: No Excoriation: Yes Ecchymosis: No Induration: Yes Erythema: Yes Rash: No Erythema Location: Circumferential Scarring: No Hemosiderin Staining: No Mottled: No Moisture Pallor: No No Abnormalities Noted: No Rubor: Yes Dry / Scaly: No Maceration: No Temperature / Pain Temperature: Hot Wound Preparation Ulcer Cleansing: Rinsed/Irrigated with Saline Topical Anesthetic Applied: Other: Lidocaine 4%, Treatment  Notes Wound #1 (Left, Lateral Lower Leg) 1. Cleansed with: Clean wound with Normal Saline 2. Anesthetic Topical Lidocaine 4% cream to wound bed  prior to debridement 4. Dressing Applied: Santyl Ointment 5. Secondary Dressing Applied Bordered Foam Dressing Electronic Signature(s) Signed: 01/21/2017 5:20:16 PM By: [name], [name], RN, [name], [name], [name] Entered By: [name], [name], RN, [name], [name] on 01/21/2017 08:32:17 [name] ([id number]) -------------------------------------------------------------------------------- Vitals Details Patient Name: [name] Date of Service: 01/21/2017 8:15 AM Medical Record Number: [id number] Patient Account Number: [id number] Date of Birth/Sex: 12-31-1980 (36 y.o. Male) Treating RN: [name] Primary Care Himmat Enberg: [name] Other Clinician: Referring Aadvika Konen: [name] Treating Keylor Rands/Extender: Tito Dine in Treatment: 6 Vital Signs Time Taken: 08:28 Temperature (F): 98.4 Height (in): 71 Pulse (bpm): 76 Weight (lbs): 338 Respiratory Rate (breaths/min): 16 Body Mass Index (BMI): 47.1 Blood Pressure (mmHg): 135/77 Reference Range: 80 - 120 mg / dl Electronic Signature(s) Signed: 01/21/2017 5:20:16 PM By: [name], [name], RN, [name], [name], [name] Entered By: [name], [name], RN, [name], [name] on 01/21/2017 08:29:11

## 2017-01-22 NOTE — Progress Notes (Signed)
[name], [name] ([id number]) Visit Report for 01/21/2017 HPI Details Patient Name: [name] Date of Service: 01/21/2017 8:15 AM Medical Record Number: [id number] Patient Account Number: [id number] Date of Birth/Sex: 20-Feb-1981 (35 y.o. Male) Treating RN: Huel Coventry Primary Care Provider: [name] Other Clinician: Referring Provider: [name] Treating Provider/Extender: Maxwell Caul Weeks in Treatment: 6 History of Present Illness HPI Description: 12/04/16; 36 year old man who comes into the clinic today for review of a wound on the posterior left calf. He tells me that is been there for about a year. He is not a diabetic he does smoke half a pack per day. He was seen in the ER on 11/20/16 felt to have cellulitis around the wound and was given clindamycin. An x-ray did not show osteomyelitis. The patient initially tells me that he has a milk allergy that sets off a pruritic itching rash on his lower legs which she scratches incessantly and he thinks that's what may have set up the wound. He has been using various topical antibiotics and ointments without any effect. He works in a [employer]ing [employer] and is on his feet all day. He does not have a prior history of wounds however he does have the rash on both lower legs the right arm and the ventral aspect of his left arm. These are excoriations and clearly have had scratching however there are of macular looking areas on both legs including a substantial larger area on the right leg. This does not have an underlying open area. There is no blistering. 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 He seemed to recognize this. 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 He is not in any treatment for this. He's had no recent  travel 12/11/16; the patient arrives today with his wound and roughly the same condition we've been using silver alginate this is a deep punched out wound with some surrounding erythema but no tenderness. Biopsy I did did not show confirmed pyoderma gangrenosum suggested nonspecific inflammation and vasculitis but does not provide an actual description of what was seen by the pathologist. I'm really not able to understand this We have also received information from the patient's dermatologist in [place] notes from April 2016. This was a doctor Agarwal-antal. 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 He was prescribed topical steroid high potency under occlusion which helped but at this point the patient did not have a deep punched out wound. 12/18/16; the patient's wound is larger in terms of surface area however this surface looks better and there is less depth. The surrounding erythema also is better. The patient states that the wrap we put on came off 2 days ago when he has been using his compression stockings. He we are in the process of getting a dermatology consult. 12/26/16 on evaluation today patient's left lower extremity wound shows evidence of infection with surrounding erythema noted. He has been tolerating the dressing changes but states that he has noted more discomfort. There is a larger area of erythema surrounding the wound. No fevers, chills, nausea, or vomiting noted at [facility], [name] ([id number]) this time. With that being said the wound still does have slough covering the surface. He is not allergic to any medication that he is aware of  at this point. In regard to his right lower extremity he had several regions that are erythematous and pruritic he wonders if there's anything we can do to help that. 01/02/17 I reviewed patient's wound culture which was obtained his visit last week. He was placed on doxycycline at that point. Unfortunately that does not appear to be  an antibiotic that would likely help with the situation however the pseudomonas noted on culture is sensitive to Cipro. Also unfortunately patient's wound seems to have a large compared to last week's evaluation. Not severely so but there are definitely increased measurements in general. He is continuing to have discomfort as well he writes this to be a seven out of 10. In fact he would prefer me not to perform any debridement today due to the fact that he is having discomfort and considering he has an active infection on the little reluctant to do so anyway. No fevers, chills, nausea, or vomiting noted at this time. 01/08/17; patient seems dermatology on September 5. I suspect dermatology will want the slides from the biopsy I did sent to their pathologist. I'm not sure if there is a way we can expedite that. In any case the culture I did before I left on vacation 3 weeks ago showed Pseudomonas he was given 10 days of Cipro and per her description of her intake nurses is actually somewhat better this week although the wound is quite a bit bigger than I remember the last time I saw this. He still has 3 more days of Cipro 01/21/17; dermatology appointment tomorrow. He has completed the ciprofloxacin for Pseudomonas. Surface of the wound looks better however he is had some deterioration in the lesions on his right leg. Meantime the left lateral leg wound we will continue with sample Electronic Signature(s) Signed: 01/22/2017 4:30:21 AM By: Baltazar Najjar MD Entered By: Baltazar Najjar on 01/21/2017 08:42:41 [name], [name] ([id number]) -------------------------------------------------------------------------------- Physical Exam Details Patient Name: [name] Date of Service: 01/21/2017 8:15 AM Medical Record Number: [id number] Patient Account Number: [id number] Date of Birth/Sex: March 19, 1981 (35 y.o. Male) Treating RN: Huel Coventry Primary Care Provider: [name] Other Clinician: Referring  Provider: [name] Treating Provider/Extender: Maxwell Caul Weeks in Treatment: 6 Constitutional Sitting or standing Blood Pressure is within target range for patient.. Pulse regular and within target range for patient.Marland Kitchen Respirations regular, non-labored and within target range.. Temperature is normal and within the target range for the patient.Marland Kitchen appears in no distress. Eyes Conjunctivae clear. No discharge. Respiratory Respiratory effort is easy and symmetric bilaterally. Rate is normal at rest and on room air.. Cardiovascular Pedal pulses palpable and strong bilaterally.. Lymphatic None palpable in the popliteal or inguinal area. Integumentary (Hair, Skin) The patient's wound surface looks better but no change in dimensions. This still could handle some mechanical debridement however given his appointment tomorrow I will leave this for next week.. Notes Wound exam; there is still erythema around this wound however no tenderness really no suggestion that this is actively infected. Wound bed itself still has some adherent fibrinous material however I did not go ahead and debride this today preferring to let the dermatologist look at this tomorrow before I consider further debridement. Notable for the fact that he also has areas on his other leg which I'd like the dermatologist to see Electronic Signature(s) Signed: 01/22/2017 4:30:21 AM By: Baltazar Najjar MD Entered By: Baltazar Najjar on 01/21/2017 08:44:33 [name] ([id number]) -------------------------------------------------------------------------------- Physician Orders Details Patient Name: [name] Date of Service: 01/21/2017 8:15  AM Medical Record Number: [id number] Patient Account Number: [id number] Date of Birth/Sex: 05/12/1981 (35 y.o. Male) Treating RN: Huel CoventryWoody, [name] Primary Care Provider: Vernona RiegerLARK, KATHERINE Other Clinician: Referring Provider: Vernona RiegerLARK, KATHERINE Treating Provider/Extender: Altamese CarolinaOBSON, Kaleem Sartwell  G Weeks in Treatment: 6 Verbal / Phone Orders: No Diagnosis Coding Wound Cleansing Wound #1 Left,Lateral Lower Leg o May Shower, gently pat wound dry prior to applying new dressing. o No tub bath. Anesthetic Wound #1 Left,Lateral Lower Leg o Topical Lidocaine 4% cream applied to wound bed prior to debridement - In Clinic Primary Wound Dressing o Santyl Ointment Secondary Dressing Wound #1 Left,Lateral Lower Leg o XtraSorb Dressing Change Frequency Wound #1 Left,Lateral Lower Leg o Change dressing every day. Follow-up Appointments Wound #1 Left,Lateral Lower Leg o Return Appointment in 1 week. Edema Control Wound #1 Left,Lateral Lower Leg o Patient to wear own compression stockings o Elevate legs to the level of the heart and pump ankles as often as possible Additional Orders / Instructions Wound #1 Left,Lateral Lower Leg o Increase protein intake. o Activity as tolerated Medications-please add to medication list. [name], [name] ([id number]) Wound #1 Left,Lateral Lower Leg o P.O. Antibiotics - Continnue Abx o Topical Antibiotic - TAC o Santyl Enzymatic Ointment Electronic Signature(s) Signed: 01/21/2017 5:20:16 PM By: Elliot GurneyWoody, [name], RN, [name], [name], [name] Signed: 01/22/2017 4:30:21 AM By: Baltazar Najjarobson, Kaylla Cobos MD Entered By: Elliot GurneyWoody, [name], RN, [name], [name] on 01/21/2017 08:42:07 [name] AngerLAMER, Joseguadalupe ([id number]) -------------------------------------------------------------------------------- Problem List Details Patient Name: [name] AngerLAMER, Chadwick Date of Service: 01/21/2017 8:15 AM Medical Record Number: [id number] Patient Account Number: [id number] Date of Birth/Sex: 08/29/1980 (35 y.o. 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 I87.2 Venous insufficiency (chronic) (peripheral) 12/04/2016 Yes Inactive Problems Resolved Problems Electronic Signature(s) Signed: 01/22/2017 4:30:21 AM By: Baltazar Najjarobson, Kailly Richoux MD Entered By: Baltazar Najjarobson, Takhia Spoon on 01/21/2017 08:41:30 [name] AngerLAMER, Basheer ([id number]) -------------------------------------------------------------------------------- Progress Note Details Patient Name: [name] AngerLAMER, Nidal Date of Service: 01/21/2017 8:15 AM Medical Record Number: [id number] Patient Account Number: [id number] Date of Birth/Sex: 01/13/1981 (35 y.o. Male) Treating RN: Huel CoventryWoody, [name] Primary Care Provider: Vernona RiegerLARK, KATHERINE Other Clinician: Referring Provider: Vernona RiegerLARK, KATHERINE Treating Provider/Extender: Maxwell CaulOBSON, Karyme Mcconathy G Weeks in Treatment: 6 Subjective History of Present Illness (HPI) 12/04/16; 36 year old man who comes into the clinic today for review of a wound on the posterior left calf. He tells me that is been there for about a year. He is not a diabetic he does smoke half a pack per day. He was seen in the ER on 11/20/16 felt to have cellulitis around the wound and was given clindamycin. An x-ray did not show osteomyelitis. The patient initially tells me that he has a milk allergy that sets off a pruritic itching rash on his lower legs which she scratches incessantly and he thinks that's what may have set up the wound. He has been using various topical antibiotics and ointments without any effect. He works in a [employer]ing [employer] and is on his feet all day. He does not have a prior history of wounds however he does have the rash on both lower legs the right arm and the ventral aspect of his left arm. These are excoriations and clearly have had scratching however there are of macular looking areas on both legs including a substantial larger area on the right leg. This does not have  an underlying open area. There is no blistering. The patient tells me that 2 years ago  in [place] in response to the rash on his legs he saw a dermatologist who told him he had a condition which may be pyoderma gangrenosum although I may be putting words into his mouth. He seemed to recognize this. 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 He is not in any treatment for this. He's had no recent travel 12/11/16; the patient arrives today with his wound and roughly the same condition we've been using silver alginate this is a deep punched out wound with some surrounding erythema but no tenderness. Biopsy I did did not show confirmed pyoderma gangrenosum suggested nonspecific inflammation and vasculitis but does not provide an actual description of what was seen by the pathologist. I'm really not able to understand this We have also received information from the patient's dermatologist in [place] notes from April 2016. This was a doctor Agarwal-antal. 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 He was prescribed topical steroid high potency under occlusion which helped but at this point the patient did not have a deep punched out wound. 12/18/16; the patient's wound is larger in terms of surface area however this surface looks better and there is less depth. The surrounding erythema also is better. The patient states that the wrap we put on came off 2 days ago when he has been using his compression stockings. He we are in the process of getting a dermatology consult. 12/26/16 on evaluation today patient's left lower extremity wound shows evidence of infection with surrounding erythema noted. He has been tolerating the dressing changes but states that he has noted more discomfort. There is a larger area of erythema surrounding the wound. No fevers, chills, nausea, or vomiting noted at this time. With that being said the wound still does have slough covering the surface. He is not allergic to any medication that he is aware of at this  point. In regard to his right lower extremity he had several regions that are erythematous and pruritic he wonders if there's anything we can do to help that. [name], [name] ([id number]) 01/02/17 I reviewed patient's wound culture which was obtained his visit last week. He was placed on doxycycline at that point. Unfortunately that does not appear to be an antibiotic that would likely help with the situation however the pseudomonas noted on culture is sensitive to Cipro. Also unfortunately patient's wound seems to have a large compared to last week's evaluation. Not severely so but there are definitely increased measurements in general. He is continuing to have discomfort as well he writes this to be a seven out of 10. In fact he would prefer me not to perform any debridement today due to the fact that he is having discomfort and considering he has an active infection on the little reluctant to do so anyway. No fevers, chills, nausea, or vomiting noted at this time. 01/08/17; patient seems dermatology on September 5. I suspect dermatology will want the slides from the biopsy I did sent to their pathologist. I'm not sure if there is a way we can expedite that. In any case the culture I did before I left on vacation 3 weeks ago showed Pseudomonas he was given 10 days of Cipro and per her description of her intake nurses is actually somewhat better this week although the wound is quite a bit bigger than I remember the last time I  saw this. He still has 3 more days of Cipro 01/21/17; dermatology appointment tomorrow. He has completed the ciprofloxacin for Pseudomonas. Surface of the wound looks better however he is had some deterioration in the lesions on his right leg. Meantime the left lateral leg wound we will continue with sample Objective Constitutional Sitting or standing Blood Pressure is within target range for patient.. Pulse regular and within target range for patient.Marland Kitchen Respirations regular,  non-labored and within target range.. Temperature is normal and within the target range for the patient.Marland Kitchen appears in no distress. Vitals Time Taken: 8:28 AM, Height: 71 in, Weight: 338 lbs, BMI: 47.1, Temperature: 98.4 F, Pulse: 76 bpm, Respiratory Rate: 16 breaths/min, Blood Pressure: 135/77 mmHg. Eyes Conjunctivae clear. No discharge. Respiratory Respiratory effort is easy and symmetric bilaterally. Rate is normal at rest and on room air.. Cardiovascular Pedal pulses palpable and strong bilaterally.. Lymphatic None palpable in the popliteal or inguinal area. [name], [name] ([id number]) General Notes: Wound exam; there is still erythema around this wound however no tenderness really no suggestion that this is actively infected. Wound bed itself still has some adherent fibrinous material however I did not go ahead and debride this today preferring to let the dermatologist look at this tomorrow before I consider further debridement. Notable for the fact that he also has areas on his other leg which I'd like the dermatologist to see Integumentary (Hair, Skin) The patient's wound surface looks better but no change in dimensions. This still could handle some mechanical debridement however given his appointment tomorrow I will leave this for next week.. Wound #1 status is Open. Original cause of wound was Gradually Appeared. The wound is located on the Left,Lateral Lower Leg. The wound measures 4.5cm length x 3cm width x 0.3cm depth; 10.603cm^2 area and 3.181cm^3 volume. There is Fat Layer (Subcutaneous Tissue) Exposed exposed. There is no tunneling or undermining noted. There is a large amount of serosanguineous drainage noted. The wound margin is flat and intact. There is small (1-33%) granulation within the wound bed. There is a large (67-100%) amount of necrotic tissue within the wound bed including Adherent Slough. The periwound skin appearance exhibited: Excoriation, Induration, Rubor,  Erythema. The periwound skin appearance did not exhibit: Callus, Crepitus, Rash, Scarring, Dry/Scaly, Maceration, Atrophie Blanche, Cyanosis, Ecchymosis, Hemosiderin Staining, Mottled, Pallor. The surrounding wound skin color is noted with erythema which is circumferential. Periwound temperature was noted as Hot. 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 No tub bath. Anesthetic: Wound #1 Left,Lateral Lower Leg: Topical Lidocaine 4% cream applied to wound bed prior to debridement - In Clinic Primary Wound Dressing: Santyl Ointment Secondary Dressing: Wound #1 Left,Lateral Lower Leg: [name], [name] ([id number]) XtraSorb Dressing Change Frequency: Wound #1 Left,Lateral Lower Leg: Change dressing every day. Follow-up Appointments: Wound #1 Left,Lateral Lower Leg: Return Appointment in 1 week. Edema Control: Wound #1 Left,Lateral Lower Leg: Patient to wear own compression stockings Elevate legs to the level of the heart and pump ankles as often as possible Additional Orders / Instructions: Wound #1 Left,Lateral Lower Leg: Increase protein intake. Activity as tolerated Medications-please add to medication list.: Wound #1 Left,Lateral Lower Leg: P.O. Antibiotics - Continnue Abx Topical Antibiotic - TAC Santyl Enzymatic Ointment #1 we'll continue with Santyl as a primary dressing to the left lateral lower leg wound #2 my  biopsy suggested vasculitis but the report was vague and without the actual tissue description. I suspect we will need another pathologist review #3 the patient has a history of ulcerative colitis although it's been quiescent. My biopsy did not confirm pyoderma gangrenosum Electronic Signature(s) Signed: 01/22/2017 4:30:21 AM By:  Baltazar Najjar MD Entered By: Baltazar Najjar on 01/21/2017 08:45:36 [name], [name] ([id number]) -------------------------------------------------------------------------------- SuperBill Details Patient Name: [name] Date of Service: 01/21/2017 Medical Record Number: [id number] Patient Account Number: [id number] Date of Birth/Sex: 1980-10-03 (35 y.o. Male) Treating RN: Huel Coventry Primary Care Provider: [name] Other Clinician: Referring Provider: [name] Treating Provider/Extender: Maxwell Caul Weeks in Treatment: 6 Diagnosis Coding ICD-10 Codes Code Description [phone] 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sis of muscle I87.2 Venous insufficiency (chronic) (peripheral) Facility Procedures CPT4 Code: [id number] Description: 99213 - WOUND CARE VISIT-LEV 3 EST PT Modifier: Quantity: 1 Physician Procedures CPT4 Code: [id number] Description: 99213 - WC PHYS LEVEL 3 - EST PT ICD-10 Description Diagnosis L97.223 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 Modifier: sis of muscle Quantity: 1 Electronic Signature(s) Signed: 01/22/2017 4:30:21 AM By: Baltazar Najjar MD Entered By: Baltazar Najjar on 01/21/2017 08:45:54

## 2017-01-28 ENCOUNTER — Ambulatory Visit: Payer: 59 | Admitting: Internal Medicine

## 2017-01-29 ENCOUNTER — Encounter: Payer: 59 | Admitting: Internal Medicine

## 2017-01-29 DIAGNOSIS — L97223 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sis of muscle: Secondary | ICD-10-CM | POA: Diagnosis not present

## 2017-01-29 DIAGNOSIS — S81802A Unspecified open wound, left lower leg, initial encounter: Secondary | ICD-10-CM | POA: Diagnosis not present

## 2017-01-29 DIAGNOSIS — L958 Other vasculitis limited to the skin: Secondary | ICD-10-CM | POA: Diagnosis not present

## 2017-01-30 NOTE — Progress Notes (Signed)
IOKEPA, GEFFRE ([id number]) Visit Report for 01/29/2017 Chief Complaint Document Details Patient Name: [name], [name] Date of Service: 01/29/2017 1:30 PM Medical Record Number: [id number] Patient Account Number: [id number] Date of Birth/Sex: May 11, 1981 (36 y.o. Male) Treating RN: Phillis Haggis Primary Care Provider: [name] Other Clinician: Referring Provider: [name] Treating Provider/Extender: Maxwell Caul Weeks in Treatment: 8 Information Obtained from: Patient Chief Complaint 12/04/16; patient is here for review of a wound on his posterior left calf Electronic Signature(s) Signed: 01/29/2017 4:34:43 PM By: Baltazar Najjar MD Entered By: Baltazar Najjar on 01/29/2017 14:28:36 [name] ([id number]) -------------------------------------------------------------------------------- Debridement Details Patient Name: [name] Date of Service: 01/29/2017 1:30 PM Medical Record Number: [id number] Patient Account Number: [id number] Date of Birth/Sex: [birth year] (36 y.o. Male) Treating RN: Phillis Haggis Primary Care Provider: [name] Other Clinician: Referring Provider: [name] Treating Provider/Extender: Altamese Shirley in Treatment: 8 Debridement Performed for Wound #1 Left,Lateral Lower Leg Assessment: Performed By: Physician Maxwell Caul, MD Debridement: Debridement Pre-procedure Verification/Time Out Yes - 13:40 Taken: Start Time: 13:41 Pain Control: Lidocaine 4% Topical Solution Level: Skin/Subcutaneous Tissue Total Area Debrided (L x 4.7 (cm) x 3.1 (cm) = 14.57 (cm) W): Tissue and other Viable, Non-Viable, Exudate, Fibrin/Slough, Subcutaneous material debrided: Instrument: Curette Bleeding: Minimum Hemostasis Achieved: Pressure End Time: 13:43 Procedural Pain: 0 Post Procedural Pain: 0 Response to Treatment: Procedure was tolerated well Post Debridement Measurements of Total Wound Length: (cm) 4.7 Width:  (cm) 3.1 Depth: (cm) 0.3 Volume: (cm) 3.433 Character of Wound/Ulcer Post Requires Further Debridement Debridement: Post Procedure Diagnosis Same as Pre-procedure Electronic Signature(s) Signed: 01/29/2017 4:34:43 PM By: Baltazar Najjar MD Signed: 01/29/2017 4:54:17 PM By: Alejandro Mulling Entered By: Baltazar Najjar on 01/29/2017 14:28:25 [name] ([id number]) -------------------------------------------------------------------------------- HPI Details Patient Name: [name] Date of Service: 01/29/2017 1:30 PM Medical Record Number: [id number] Patient Account Number: [id number] Date of Birth/Sex: 23-Jun-1980 (36 y.o. Male) Treating RN: Phillis Haggis Primary Care Provider: [name] Other Clinician: Referring Provider: [name] Treating Provider/Extender: Maxwell Caul Weeks in Treatment: 8 History of Present Illness HPI Description: 12/04/16; 36 year old man who comes into the clinic today for review of a wound on the posterior left calf. He tells me that is been there for about a year. He is not a diabetic he does smoke half a pack per day. He was seen in the ER on 11/20/16 felt to have cellulitis around the wound and was given clindamycin. An x-ray did not show osteomyelitis. The patient initially tells me that he has a milk allergy that sets off a pruritic itching rash on his lower legs which she scratches incessantly and he thinks that's what may have set up the wound. He has been using various topical antibiotics and ointments without any effect. He works in a [employer]ing [employer] and is on his feet all day. He does not have a prior history of wounds however he does have the rash on both lower legs the right arm and the ventral aspect of his left arm. These are excoriations and clearly have had scratching however there are of macular looking areas on both legs including a substantial larger area on the right leg. This does not have an underlying open area. There  is no blistering. 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 He seemed  to recognize this. 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 He is not in any treatment for this. He's had no recent travel 12/11/16; the patient arrives today with his wound and roughly the same condition we've been using silver alginate this is a deep punched out wound with some surrounding erythema but no tenderness. Biopsy I did did not show confirmed pyoderma gangrenosum suggested nonspecific inflammation and vasculitis but does not provide an actual description of what was seen by the pathologist. I'm really not able to understand this We have also received information from the patient's dermatologist in [place] notes from April 2016. This was a doctor Agarwal-antal. 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 He was prescribed topical steroid high potency under occlusion which helped but at this point the patient did not have a deep punched out wound. 12/18/16; the patient's wound is larger in terms of surface area however this surface looks better and there is less depth. The surrounding erythema also is better. The patient states that the wrap we put on came off 2 days ago when he has been using his compression stockings. He we are in the process of getting a dermatology consult. 12/26/16 on evaluation today patient's left lower extremity wound shows evidence of infection with surrounding erythema noted. He has been tolerating the dressing changes but states that he has noted more discomfort. There is a larger area of erythema surrounding the wound. No fevers, chills, nausea, or vomiting noted at this time. With that being said the wound still does have slough covering the surface. He is not  allergic to any medication that he is aware of at this point. In regard to his right lower extremity he had several regions that are erythematous and pruritic he wonders if there's anything we can do to help that. LOY, MCCARTT ([id number]) 01/02/17 I reviewed patient's wound culture which was obtained his visit last week. He was placed on doxycycline at that point. Unfortunately that does not appear to be an antibiotic that would likely help with the situation however the pseudomonas noted on culture is sensitive to Cipro. Also unfortunately patient's wound seems to have a large compared to last week's evaluation. Not severely so but there are definitely increased measurements in general. He is continuing to have discomfort as well he writes this to be a seven out of 10. In fact he would prefer me not to perform any debridement today due to the fact that he is having discomfort and considering he has an active infection on the little reluctant to do so anyway. No fevers, chills, nausea, or vomiting noted at this time. 01/08/17; patient seems dermatology on September 5. I suspect dermatology will want the slides from the biopsy I did sent to their pathologist. I'm not sure if there is a way we can expedite that. In any case the culture I did before I left on vacation 3 weeks ago showed Pseudomonas he was given 10 days of Cipro and per her description of her intake nurses is actually somewhat better this week although the wound is quite a bit bigger than I remember the last time I saw this. He still has 3 more days of Cipro 01/21/17; dermatology appointment tomorrow. He has completed the ciprofloxacin for Pseudomonas. Surface of the wound looks better however he is had some deterioration in the lesions on his right leg. Meantime the left lateral leg wound we will continue with sample 01/29/17; patient had  his dermatology appointment but I can't yet see that note. He is completed his antibiotics. The  wound is more superficial but considerably larger in circumferential area than when he came in. This is in his left lateral calf. He also has swollen erythematous areas with superficial wounds on the right leg and small papular areas on both arms. There apparently areas in her his upper thighs and buttocks I did not look at those. Dermatology biopsied the right leg. Hopefully will have their input next week. Electronic Signature(s) Signed: 01/29/2017 4:34:43 PM By: Baltazar Najjar MD Entered By: Baltazar Najjar on 01/29/2017 14:29:58 [name] ([id number]) -------------------------------------------------------------------------------- Physical Exam Details Patient Name: [name] Date of Service: 01/29/2017 1:30 PM Medical Record Number: [id number] Patient Account Number: [id number] Date of Birth/Sex: 16-Aug-1980 (36 y.o. Male) Treating RN: Phillis Haggis Primary Care Provider: [name] Other Clinician: Referring Provider: [name] Treating Provider/Extender: Maxwell Caul Weeks in Treatment: 8 Constitutional Patient is hypertensive.. Pulse regular and within target range for patient.Marland Kitchen Respirations regular, non-labored and within target range.. Temperature is normal and within the target range for the patient.Marland Kitchen appears in no distress. Notes Exam; left lateral leg large circumferential albeit superficial wound with surrounding erythema. Tight adherent necrotic material. Using a #5 curet the wound was debrided of this adherent necrotic material. Underneath the granulation doesn't look too bad oSimilar areas of erythema with superficial wounds with eschar in the right leg we have not been managing these. I note the biopsy site Electronic Signature(s) Signed: 01/29/2017 4:34:43 PM By: Baltazar Najjar MD Entered By: Baltazar Najjar on 01/29/2017 14:32:21 [name]  ([id number]) -------------------------------------------------------------------------------- Physician Orders Details Patient Name: [name] Date of Service: 01/29/2017 1:30 PM Medical Record Number: [id number] Patient Account Number: [id number] Date of Birth/Sex: Dec 17, 1980 (36 y.o. Male) Treating RN: Phillis Haggis Primary Care Provider: [name] Other Clinician: Referring Provider: [name] Treating Provider/Extender: Altamese Cuartelez in Treatment: 8 Verbal / Phone Orders: Yes ClinicianAshok Cordia, Debi Read Back and Verified: Yes Diagnosis Coding Wound Cleansing Wound #1 Left,Lateral Lower Leg o May Shower, gently pat wound dry prior to applying new dressing. o No tub bath. Anesthetic Wound #1 Left,Lateral Lower Leg o Topical Lidocaine 4% cream applied to wound bed prior to debridement - In Clinic Primary Wound Dressing o Santyl Ointment Secondary Dressing Wound #1 Left,Lateral Lower Leg o XtraSorb Dressing Change Frequency Wound #1 Left,Lateral Lower Leg o Change dressing every day. Follow-up Appointments Wound #1 Left,Lateral Lower Leg o Return Appointment in 1 week. Edema Control Wound #1 Left,Lateral Lower Leg o Patient to wear own compression stockings o Elevate legs to the level of the heart and pump ankles as often as possible Additional Orders / Instructions Wound #1 Left,Lateral Lower Leg o Increase protein intake. o Activity as tolerated Medications-please add to medication list. GRIM, Zaylen ([id number]) Wound #1 Left,Lateral Lower Leg o P.O. Antibiotics - Continnue Abx o Topical Antibiotic - TAC o Santyl Enzymatic Ointment Patient Medications Allergies: mik, Biaxin, seasonal Notifications Medication Indication Start End Santyl 01/29/2017 DOSE topical 250 unit/gram ointment - ointment topical to wound daily Electronic Signature(s) Signed: 01/29/2017 2:33:56 PM By: Baltazar Najjar MD Entered By:  Baltazar Najjar on 01/29/2017 14:33:56 Gibeault, [name] ([id number]) -------------------------------------------------------------------------------- Problem List Details Patient Name: [name] Date of Service: 01/29/2017 1:30 PM Medical Record Number: [id number] Patient Account Number: [id number] Date of Birth/Sex: [birth year] (36 y.o. 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 Male) Treating RN: Phillis Haggis Primary Care Provider: [name] Other Clinician: Referring Provider: [name] Treating Provider/Extender: Maxwell Caul Weeks in Treatment: 8 Subjective Chief Complaint Information obtained from Patient 12/04/16; patient is here for review of a wound on his posterior left calf History of Present Illness (HPI) 12/04/16; 36 year old man who comes into the clinic today for review of a wound on the posterior left calf. He tells me that is been there for about a year. He is not a diabetic he does smoke half a pack per day. He was seen in the ER on 11/20/16 felt to have cellulitis around the wound and was given clindamycin. An x-ray  did not show osteomyelitis. The patient initially tells me that he has a milk allergy that sets off a pruritic itching rash on his lower legs which she scratches incessantly and he thinks that's what may have set up the wound. He has been using various topical antibiotics and ointments without any effect. He works in a [employer]ing [employer] and is on his feet all day. He does not have a prior history of wounds however he does have the rash on both lower legs the right arm and the ventral aspect of his left arm. These are excoriations and clearly have had scratching however there are of macular looking areas on both legs including a substantial larger area on the right leg. This does not have an underlying open area. There is no blistering. 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 He seemed to recognize this. 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 He is not in any treatment for this. He's had no recent travel 12/11/16; the patient arrives today with his wound and roughly the same condition we've been using silver alginate this is a deep punched out wound with some surrounding erythema but no tenderness. Biopsy I did did not show confirmed pyoderma gangrenosum suggested nonspecific inflammation and vasculitis but does not provide an actual description of what was seen by the pathologist. I'm really not able to understand this We have also received information from the patient's dermatologist in [place] notes from April 2016. This was a doctor Agarwal-antal. 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 He was prescribed topical steroid high potency under occlusion which helped but at this point the patient did not have a deep punched out wound. 12/18/16; the patient's wound is  larger in terms of surface area however this surface looks better and there is less depth. The surrounding erythema also is better. The patient states that the wrap we put on came off 2 days ago when he has been using his compression stockings. He we are in the process of getting a dermatology consult. 12/26/16 on evaluation today patient's left lower extremity wound  shows evidence of infection with surrounding Hilton, Eliott ([id number]) erythema noted. He has been tolerating the dressing changes but states that he has noted more discomfort. There is a larger area of erythema surrounding the wound. No fevers, chills, nausea, or vomiting noted at this time. With that being said the wound still does have slough covering the surface. He is not allergic to any medication that he is aware of at this point. In regard to his right lower extremity he had several regions that are erythematous and pruritic he wonders if there's anything we can do to help that. 01/02/17 I reviewed patient's wound culture which was obtained his visit last week. He was placed on doxycycline at that point. Unfortunately that does not appear to be an antibiotic that would likely help with the situation however the pseudomonas noted on culture is sensitive to Cipro. Also unfortunately patient's wound seems to have a large compared to last week's evaluation. Not severely so but there are definitely increased measurements in general. He is continuing to have discomfort as well he writes this to be a seven out of 10. In fact he would prefer me not to perform any debridement today due to the fact that he is having discomfort and considering he has an active infection on the little reluctant to do so anyway. No fevers, chills, nausea, or vomiting noted at this time. 01/08/17; patient seems dermatology on September 5. I suspect dermatology will want the slides from the biopsy I did sent to their pathologist. I'm not sure if there is a way  we can expedite that. In any case the culture I did before I left on vacation 3 weeks ago showed Pseudomonas he was given 10 days of Cipro and per her description of her intake nurses is actually somewhat better this week although the wound is quite a bit bigger than I remember the last time I saw this. He still has 3 more days of Cipro 01/21/17; dermatology appointment tomorrow. He has completed the ciprofloxacin for Pseudomonas. Surface of the wound looks better however he is had some deterioration in the lesions on his right leg. Meantime the left lateral leg wound we will continue with sample 01/29/17; patient had his dermatology appointment but I can't yet see that note. He is completed his antibiotics. The wound is more superficial but considerably larger in circumferential area than when he came in. This is in his left lateral calf. He also has swollen erythematous areas with superficial wounds on the right leg and small papular areas on both arms. There apparently areas in her his upper thighs and buttocks I did not look at those. Dermatology biopsied the right leg. Hopefully will have their input next week. Objective Constitutional Patient is hypertensive.. Pulse regular and within target range for patient.Marland Kitchen. Respirations regular, non-labored and within target range.. Temperature is normal and within the target range for the patient.Marland Kitchen. appears in no distress. Vitals Time Taken: 1:53 PM, Height: 71 in, Weight: 338 lbs, BMI: 47.1, Temperature: 98.2 F, Pulse: 82 bpm, Respiratory Rate: 18 breaths/min, Blood Pressure: 147/69 mmHg. General Notes: Exam; left lateral leg large circumferential albeit superficial wound with surrounding Corniel, Kekoa ([id number]) erythema. Tight adherent necrotic material. Using a #5 curet the wound was debrided of this adherent necrotic material. Underneath the granulation doesn't look too bad Similar areas of erythema with superficial wounds with eschar in the  right leg we have not been managing these. I note the biopsy site Integumentary (Hair, Skin) Wound #1 status  is Open. Original cause of wound was Gradually Appeared. The wound is located on the Left,Lateral Lower Leg. The wound measures 4.7cm length x 3.1cm width x 0.2cm depth; 11.443cm^2 area and 2.289cm^3 volume. There is Fat Layer (Subcutaneous Tissue) Exposed exposed. There is no tunneling or undermining noted. There is a large amount of serous drainage noted. The wound margin is flat and intact. There is no granulation within the wound bed. There is a large (67-100%) amount of necrotic tissue within the wound bed including Adherent Slough. The periwound skin appearance exhibited: Excoriation, Induration, Rubor, Erythema. The periwound skin appearance did not exhibit: Callus, Crepitus, Rash, Scarring, Dry/Scaly, Maceration, Atrophie Blanche, Cyanosis, Ecchymosis, Hemosiderin Staining, Mottled, Pallor. The surrounding wound skin color is noted with erythema which is circumferential. Periwound temperature was noted as Hot. 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 There was a Skin/Subcutaneous Tissue Debridement ([id number]) debridement with total area of 14.57 sq cm performed by Maxwell Caul, MD. with the following instrument(s): Curette to remove Viable and Non-Viable tissue/material including Exudate, Fibrin/Slough, and Subcutaneous after achieving pain control using Lidocaine 4% Topical Solution. A time out was conducted at 13:40, prior to the start of the procedure. A Minimum amount of bleeding was controlled with Pressure. The procedure was tolerated well with a pain level of 0 throughout and a pain level of 0 following the procedure. Post Debridement  Measurements: 4.7cm length x 3.1cm width x 0.3cm depth; 3.433cm^3 volume. Character of Wound/Ulcer Post Debridement requires further debridement. Post procedure Diagnosis Wound #1: Same as Pre-Procedure Mathisen, Zakkary ([id number]) Plan Wound Cleansing: Wound #1 Left,Lateral Lower Leg: May Shower, gently pat wound dry prior to applying new dressing. No tub bath. Anesthetic: Wound #1 Left,Lateral Lower Leg: Topical Lidocaine 4% cream applied to wound bed prior to debridement - In Clinic Primary Wound Dressing: Santyl Ointment Secondary Dressing: Wound #1 Left,Lateral Lower Leg: XtraSorb Dressing Change Frequency: Wound #1 Left,Lateral Lower Leg: Change dressing every day. Follow-up Appointments: Wound #1 Left,Lateral Lower Leg: Return Appointment in 1 week. Edema Control: Wound #1 Left,Lateral Lower Leg: Patient to wear own compression stockings Elevate legs to the level of the heart and pump ankles as often as possible Additional Orders / Instructions: Wound #1 Left,Lateral Lower Leg: Increase protein intake. Activity as tolerated Medications-please add to medication list.: Wound #1 Left,Lateral Lower Leg: P.O. Antibiotics - Continnue Abx Topical Antibiotic - TAC Santyl Enzymatic Ointment The following medication(s) was prescribed: Santyl topical 250 unit/gram ointment ointment topical to wound daily starting 01/29/2017 #1 continue with Santyl to the left lateral lower leg wound which is considerably larger but with less depth. Wound cleans up quite nicely. I wonder about changing to [facility] in a week to 2 Prevost, Leone ([id number]) #2 await with interest the results of the biopsy on the posterior right leg. Hopefully we can see dermatology's note in chair everywhere next week #3 the patient has erythema around the wound but I think this is inflammation. Nontender no evidence of infection currently Electronic Signature(s) Signed: 01/29/2017 4:34:43 PM By: Baltazar Najjar MD Entered By: Baltazar Najjar on 01/29/2017 14:35:28 [name] ([id number]) -------------------------------------------------------------------------------- SuperBill Details Patient Name: [name] Date of Service: 01/29/2017 Medical Record Number: [id number] Patient Account Number: [id number] Date of Birth/Sex: January 07, 1981 (36 y.o. Male) Treating RN: Phillis Haggis Primary Care Provider: Chestine Spore,  KATHERINE Other Clinician: Referring Provider: [name] Treating Provider/Extender: Altamese Frierson in Treatment: 8 Diagnosis Coding ICD-10 Codes Code Description [phone] 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sis of muscle I87.2 Venous insufficiency (chronic) (peripheral) Facility Procedures CPT4 Code: [id number] Description: 11042 - DEB SUBQ TISSUE 20 SQ CM/< ICD-10 Description Diagnosis L97.223 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 I87.2 Venous insufficiency (chronic) (peripheral) Modifier: sis of muscle Quantity: 1 Physician Procedures CPT4 Code: 1914782 Description: 11042 - WC PHYS SUBQ TISS 20 SQ CM ICD-10 Description Diagnosis 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

## 2017-01-31 NOTE — Progress Notes (Signed)
[name], [name] ([id number]) Visit Report for 01/29/2017 Arrival Information Details Patient Name: [name], [name] Date of Service: 01/29/2017 1:30 PM Medical Record Number: [id number] Patient Account Number: [id number] Date of Birth/Sex: 06/26/1980 (36 y.o. Male) Treating RN: Ahmed Prima Primary Care Kearra Calkin: [name] Other Clinician: Referring Zarria Towell: [name] Treating Vihan Santagata/Extender: Tito Dine in Treatment: 8 Visit Information History Since Last Visit All ordered tests and consults were completed: No Patient Arrived: Ambulatory Added or deleted any medications: No Arrival Time: 13:28 Any new allergies or adverse reactions: No Accompanied By: self Had a fall or experienced change in No Transfer Assistance: None activities of daily living that may affect Patient Identification Verified: Yes risk of falls: Secondary Verification Process Yes Signs or symptoms of abuse/neglect since last No Completed: visito Patient Requires Transmission-Based No Hospitalized since last visit: No Precautions: Has Dressing in Place as Prescribed: Yes Patient Has Alerts: No Pain Present Now: No Electronic Signature(s) Signed: 01/29/2017 4:54:17 PM By: Alric Quan Entered By: Alric Quan on 01/29/2017 13:28:16 [name], [name] ([id number]) -------------------------------------------------------------------------------- Encounter Discharge Information Details Patient Name: [name] Date of Service: 01/29/2017 1:30 PM Medical Record Number: [id number] Patient Account Number: [id number] Date of Birth/Sex: 08/01/1980 (36 y.o. Male) Treating RN: Ahmed Prima Primary Care Jamiee Milholland: [name] Other Clinician: Referring Rolondo Pierre: [name] Treating Xavior Niazi/Extender: Tito Dine in Treatment: 8 Encounter Discharge Information Items Discharge Pain Level: 0 Discharge Condition: Stable Ambulatory Status: Ambulatory Discharge  Destination: Home Transportation: Private Auto Accompanied By: self Schedule Follow-up Appointment: Yes Medication Reconciliation completed and provided to Patient/Care No Marialuisa Basara: Provided on Clinical Summary of Care: 01/29/2017 Form Type Recipient Paper Patient RL Electronic Signature(s) Signed: 01/30/2017 9:46:21 AM By: Ruthine Dose Entered By: Ruthine Dose on 01/29/2017 13:56:48 [name], [name] ([id number]) -------------------------------------------------------------------------------- Lower Extremity Assessment Details Patient Name: [name] Date of Service: 01/29/2017 1:30 PM Medical Record Number: [id number] Patient Account Number: [id number] Date of Birth/Sex: 1981-04-17 (36 y.o. Male) Treating RN: Carolyne Fiscal, Debi Primary Care Elida Harbin: [name] Other Clinician: Referring Galia Rahm: [name] Treating Keeanna Villafranca/Extender: Ricard Dillon Weeks in Treatment: 8 Edema Assessment Assessed: [Left: No] [Right: No] E[Left: dema] [Right: :] Calf Left: Right: Point of Measurement: 33 cm From Medial Instep 51.5 cm cm Ankle Left: Right: Point of Measurement: 11 cm From Medial Instep 30.1 cm cm Vascular Assessment Pulses: Dorsalis Pedis Palpable: [Left:Yes] Posterior Tibial Extremity colors, hair growth, and conditions: Extremity Color: [Left:Red] Temperature of Extremity: [Left:Warm] Capillary Refill: [Left:< 3 seconds] Toe Nail Assessment Left: Right: Thick: No Discolored: No Deformed: No Improper Length and Hygiene: No Electronic Signature(s) Signed: 01/29/2017 4:54:17 PM By: Alric Quan Entered By: Alric Quan on 01/29/2017 13:34:43 [name], [name] ([id number]) -------------------------------------------------------------------------------- Multi Wound Chart Details Patient Name: [name] Date of Service: 01/29/2017 1:30 PM Medical Record Number: [id number] Patient Account Number: [id number] Date of Birth/Sex: [birth year] (36 y.o.  Male) Treating RN: Ahmed Prima Primary Care Buryl Bamber: [name] Other Clinician: Referring Rai Sinagra: [name] Treating Ramona Slinger/Extender: Ricard Dillon Weeks in Treatment: 8 Vital Signs Height(in): 71 Pulse(bpm): 82 Weight(lbs): 338 Blood Pressure 147/69 (mmHg): Body Mass Index(BMI): 47 Temperature(F): 98.2 Respiratory Rate 18 (breaths/min): Photos: [1:No Photos] [N/A:N/A] Wound Location: [1:Left Lower Leg - Lateral] [N/A:N/A] Wounding Event: [1:Gradually Appeared] [N/A:N/A] Primary Etiology: [1:Vasculitis] [N/A:N/A] Comorbid History: [1:Sleep Apnea, Hypertension, Colitis] [N/A:N/A] Date Acquired: [1:11/18/2015] [N/A:N/A] Weeks of Treatment: [1:8] [N/A:N/A] Wound Status: [1:Open] [N/A:N/A] Measurements L x W x D 4.7x3.1x0.2 [N/A:N/A] (cm) Area (cm) : [1:11.443] [N/A:N/A] Volume (cm) : [1:2.289] [N/A:N/A] % Reduction in Area: [1:-133.10%] [N/A:N/A] %  Reduction in Volume: 41.70% [N/A:N/A] Classification: [1:Full Thickness Without Exposed Support Structures] [N/A:N/A] Exudate Amount: [1:Large] [N/A:N/A] Exudate Type: [1:Serous] [N/A:N/A] Exudate Color: [1:amber] [N/A:N/A] Wound Margin: [1:Flat and Intact] [N/A:N/A] Granulation Amount: [1:None Present (0%)] [N/A:N/A] Necrotic Amount: [1:Large (67-100%)] [N/A:N/A] Exposed Structures: [1:Fat Layer (Subcutaneous Tissue) Exposed: Yes Fascia: No Tendon: No Muscle: No] [N/A:N/A] Joint: No Bone: No Epithelialization: None N/A N/A Debridement: Debridement (88502- N/A N/A 11047) Pre-procedure 13:40 N/A N/A Verification/Time Out Taken: Pain Control: Lidocaine 4% Topical N/A N/A Solution Tissue Debrided: Fibrin/Slough, Exudates, N/A N/A Subcutaneous Level: Skin/Subcutaneous N/A N/A Tissue Debridement Area (sq 14.57 N/A N/A cm): Instrument: Curette N/A N/A Bleeding: Minimum N/A N/A Hemostasis Achieved: Pressure N/A N/A Procedural Pain: 0 N/A N/A Post Procedural Pain: 0 N/A N/A Debridement  Treatment Procedure was tolerated N/A N/A Response: well Post Debridement 4.7x3.1x0.3 N/A N/A Measurements L x W x D (cm) Post Debridement 3.433 N/A N/A Volume: (cm) Periwound Skin Texture: Excoriation: Yes N/A N/A Induration: Yes Callus: No Crepitus: No Rash: No Scarring: No Periwound Skin Maceration: No N/A N/A Moisture: Dry/Scaly: No Periwound Skin Color: Erythema: Yes N/A N/A Rubor: Yes Atrophie Blanche: No Cyanosis: No Ecchymosis: No Hemosiderin Staining: No Mottled: No Pallor: No Erythema Location: Circumferential N/A N/A Temperature: Hot N/A N/A Tenderness on No N/A N/A Palpation: Wound Preparation: Ulcer Cleansing: N/A N/A Rinsed/Irrigated with LORAIN, KEAST ([id number]) Saline Topical Anesthetic Applied: Other: Lidocaine 4% Procedures Performed: Debridement N/A N/A Treatment Notes Wound #1 (Left, Lateral Lower Leg) 1. Cleansed with: Clean wound with Normal Saline 2. Anesthetic Topical Lidocaine 4% cream to wound bed prior to debridement 4. Dressing Applied: Santyl Ointment 7. Secured with Tape Notes Manufacturing systems engineer) Signed: 01/29/2017 4:34:43 PM By: Linton Ham MD Entered By: Linton Ham on 01/29/2017 14:28:13 [name] ([id number]) -------------------------------------------------------------------------------- [place] Details Patient Name: [name] Date of Service: 01/29/2017 1:30 PM Medical Record Number: [id number] Patient Account Number: [id number] Date of Birth/Sex: [birth year] (36 y.o. Male) Treating RN: Ahmed Prima Primary Care Tyyonna Soucy: [name] Other Clinician: Referring Willye Javier: [name] Treating Courtnei Ruddell/Extender: Tito Dine in Treatment: 8 Active Inactive ` Orientation to the Wound Care Program Nursing Diagnoses: Knowledge deficit related to the wound healing center program Goals: Patient/caregiver will verbalize understanding of the [facility] Date Initiated: 12/11/2016 Target Resolution Date: 03/13/2017 Goal Status: Active Interventions: Provide education on orientation to the wound center Notes: ` Venous Leg Ulcer Nursing Diagnoses: Knowledge deficit related to disease process and management Potential for venous Insuffiency (use before diagnosis confirmed) Goals: Non-invasive venous studies are completed as ordered Date Initiated: 12/11/2016 Target Resolution Date: 03/13/2017 Goal Status: Active Patient will maintain optimal edema control Date Initiated: 12/11/2016 Target Resolution Date: 03/13/2017 Goal Status: Active Patient/caregiver will verbalize understanding of disease process and disease management Date Initiated: 12/11/2016 Target Resolution Date: 03/13/2017 Goal Status: Active Verify adequate tissue perfusion prior to therapeutic compression application Date Initiated: 12/11/2016 Target Resolution Date: 03/13/2017 Goal Status: Active [name], [name] ([id number]) Interventions: Assess peripheral edema status every visit. Compression as ordered Provide education on venous insufficiency Treatment Activities: Therapeutic compression applied : 12/11/2016 Notes: ` Wound/Skin Impairment Nursing Diagnoses: Impaired tissue integrity Knowledge deficit related to ulceration/compromised skin integrity Goals: Patient/caregiver will verbalize understanding of skin care regimen Date Initiated: 12/11/2016 Target Resolution Date: 03/13/2017 Goal Status: Active Ulcer/skin breakdown will have a volume reduction of 30% by week 4 Date Initiated: 12/11/2016 Target Resolution Date: 03/13/2017 Goal Status: Active Ulcer/skin breakdown will have a volume reduction of 50% by week 8 Date  Initiated: 12/11/2016 Target Resolution Date: 03/13/2017 Goal Status: Active Ulcer/skin breakdown will have a volume reduction of 80% by week 12 Date Initiated: 12/11/2016 Target Resolution Date: 03/13/2017 Goal Status:  Active Ulcer/skin breakdown will heal within 14 weeks Date Initiated: 12/11/2016 Target Resolution Date: 03/13/2017 Goal Status: Active Interventions: Assess patient/caregiver ability to obtain necessary supplies Assess patient/caregiver ability to perform ulcer/skin care regimen upon admission and as needed Assess ulceration(s) every visit Provide education on ulcer and skin care Treatment Activities: Skin care regimen initiated : 12/11/2016 Topical wound management initiated : 12/11/2016 Notes: [name], [name] ([id number]) Electronic Signature(s) Signed: 01/29/2017 4:54:17 PM By: Alric Quan Entered By: Alric Quan on 01/29/2017 13:39:30 [name], [name] ([id number]) -------------------------------------------------------------------------------- Pain Assessment Details Patient Name: [name] Date of Service: 01/29/2017 1:30 PM Medical Record Number: [id number] Patient Account Number: [id number] Date of Birth/Sex: [birth year] (36 y.o. Male) Treating RN: Ahmed Prima Primary Care Merrit Waugh: [name] Other Clinician: Referring Refugia Laneve: [name] Treating Virgene Tirone/Extender: Ricard Dillon Weeks in Treatment: 8 Active Problems Location of Pain Severity and Description of Pain Patient Has Paino No Site Locations Pain Management and Medication Current Pain Management: Electronic Signature(s) Signed: 01/29/2017 4:54:17 PM By: Alric Quan Entered By: Alric Quan on 01/29/2017 13:31:55 [name] ([id number]) -------------------------------------------------------------------------------- Patient/Caregiver Education Details Patient Name: [name] Date of Service: 01/29/2017 1:30 PM Medical Record Number: [id number] Patient Account Number: [id number] Date of Birth/Gender: 02/06/1981 (36 y.o. Male) Treating RN: Ahmed Prima Primary Care Physician: [name] Other Clinician: Referring Physician: [name] Treating  Physician/Extender: Tito Dine in Treatment: 8 Education Assessment Education Provided To: Patient Education Topics Provided Wound/Skin Impairment: Handouts: Other: change dressing as ordered Methods: Demonstration, Explain/Verbal Responses: State content correctly Electronic Signature(s) Signed: 01/29/2017 4:54:17 PM By: Alric Quan Entered By: Alric Quan on 01/29/2017 [name], [name] ([id number]) -------------------------------------------------------------------------------- Wound Assessment Details Patient Name: [name] Date of Service: 01/29/2017 1:30 PM Medical Record Number: [id number] Patient Account Number: [id number] Date of Birth/Sex: 24-Jan-1981 (36 y.o. Male) Treating RN: Carolyne Fiscal, Debi Primary Care Quamel Fitzmaurice: [name] Other Clinician: Referring Milah Recht: [name] Treating Amabel Stmarie/Extender: Ricard Dillon Weeks in Treatment: 8 Wound Status Wound Number: 1 Primary Etiology: Vasculitis Wound Location: Left Lower Leg - Lateral Wound Status: Open Wounding Event: Gradually Appeared Comorbid Sleep Apnea, Hypertension, History: Colitis Date Acquired: 11/18/2015 Weeks Of Treatment: 8 Clustered Wound: No Photos Photo Uploaded By: Alric Quan on 01/29/2017 16:49:30 Wound Measurements Length: (cm) 4.7 Width: (cm) 3.1 Depth: (cm) 0.2 Area: (cm) 11.443 Volume: (cm) 2.289 % Reduction in Area: -133.1% % Reduction in Volume: 41.7% Epithelialization: None Tunneling: No Undermining: No Wound Description Full Thickness Without Exposed Classification: Support Structures Wound Margin: Flat and Intact Exudate Large Amount: Exudate Type: Serous Exudate Color: amber Foul Odor After Cleansing: No Slough/Fibrino Yes Wound Bed Granulation Amount: None Present (0%) Exposed Structure Necrotic Amount: Large (67-100%) Fascia Exposed: No Necrotic Quality: Adherent Slough Fat Layer (Subcutaneous Tissue) Exposed:  Yes [name], [name] ([id number]) Tendon Exposed: No Muscle Exposed: No Joint Exposed: No Bone Exposed: No Periwound Skin Texture Texture Color No Abnormalities Noted: No No Abnormalities Noted: No Callus: No Atrophie Blanche: No Crepitus: No Cyanosis: No Excoriation: Yes Ecchymosis: No Induration: Yes Erythema: Yes Rash: No Erythema Location: Circumferential Scarring: No Hemosiderin Staining: No Mottled: No Moisture Pallor: No No Abnormalities Noted: No Rubor: Yes Dry / Scaly: No Maceration: No Temperature / Pain Temperature: Hot Wound Preparation Ulcer Cleansing: Rinsed/Irrigated with Saline Topical Anesthetic Applied: Other: Lidocaine 4%, Treatment Notes Wound #1 (Left, Lateral Lower Leg) 1. Cleansed  with: Clean wound with Normal Saline 2. Anesthetic Topical Lidocaine 4% cream to wound bed prior to debridement 4. Dressing Applied: Santyl Ointment 7. Secured with Tape Notes Manufacturing systems engineer) Signed: 01/29/2017 4:54:17 PM By: Alric Quan Entered By: Alric Quan on 01/29/2017 13:33:08 [name] ([id number]) -------------------------------------------------------------------------------- [place] Details Patient Name: [name] Date of Service: 01/29/2017 1:30 PM Medical Record Number: [id number] Patient Account Number: [id number] Date of Birth/Sex: Sep 01, 1980 (36 y.o. Male) Treating RN: Ahmed Prima Primary Care Zakar Brosch: [name] Other Clinician: Referring Kalia Vahey: [name] Treating Jadelynn Boylan/Extender: Tito Dine in Treatment: 8 Vital Signs Time Taken: 13:53 Temperature (F): 98.2 Height (in): 71 Pulse (bpm): 82 Weight (lbs): 338 Respiratory Rate (breaths/min): 18 Body Mass Index (BMI): 47.1 Blood Pressure (mmHg): 147/69 Reference Range: 80 - 120 mg / dl Electronic Signature(s) Signed: 01/29/2017 4:54:17 PM By: Alric Quan Entered By: Alric Quan on 01/29/2017 13:56:08

## 2017-02-03 MED ORDER — CLOBETASOL 0.05 % TOPICAL OINTMENT
2 refills | 0 days | Status: CP
Start: 2017-02-03 — End: 2017-10-20

## 2017-02-03 MED ORDER — DOXYCYCLINE MONOHYDRATE 100 MG CAPSULE
ORAL_CAPSULE | Freq: Two times a day (BID) | ORAL | 0 refills | 0 days | Status: CP
Start: 2017-02-03 — End: 2017-03-05

## 2017-02-05 ENCOUNTER — Encounter: Payer: 59 | Admitting: Internal Medicine

## 2017-02-05 DIAGNOSIS — S81802A Unspecified open wound, left lower leg, initial encounter: Secondary | ICD-10-CM | POA: Diagnosis not present

## 2017-02-05 DIAGNOSIS — T8189XA Other complications of procedures, not elsewhere classified, initial encounter: Secondary | ICD-10-CM | POA: Diagnosis not present

## 2017-02-05 DIAGNOSIS — L97223 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sis of muscle: Secondary | ICD-10-CM | POA: Diagnosis not present

## 2017-02-07 NOTE — Progress Notes (Signed)
[name], [name] ([id number]) Visit Report for 02/05/2017 HPI Details Patient Name: [name], [name] Date of Service: 02/05/2017 9:00 AM Medical Record Number: [id number] Patient Account Number: [id number] Date of Birth/Sex: December 26, 1980 (36 y.o. Male) Treating RN: Primary Care Provider: [name] Other Clinician: Referring Provider: [name] Treating Provider/Extender: Maxwell Caul Weeks in Treatment: 9 History of Present Illness HPI Description: 12/04/16; 36 year old man who comes into the clinic today for review of a wound on the posterior left calf. He tells me that is been there for about a year. He is not a diabetic he does smoke half a pack per day. He was seen in the ER on 11/20/16 felt to have cellulitis around the wound and was given clindamycin. An x-ray did not show osteomyelitis. The patient initially tells me that he has a milk allergy that sets off a pruritic itching rash on his lower legs which she scratches incessantly and he thinks that's what may have set up the wound. He has been using various topical antibiotics and ointments without any effect. He works in a [employer]ing [employer] and is on his feet all day. He does not have a prior history of wounds however he does have the rash on both lower legs the right arm and the ventral aspect of his left arm. These are excoriations and clearly have had scratching however there are of macular looking areas on both legs including a substantial larger area on the right leg. This does not have an underlying open area. There is no blistering. 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 He seemed to recognize this. 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 He is not in any treatment for this. He's had no recent  travel 12/11/16; the patient arrives today with his wound and roughly the same condition we've been using silver alginate this is a deep punched out wound with some surrounding erythema but no tenderness. Biopsy I did did not show confirmed pyoderma gangrenosum suggested nonspecific inflammation and vasculitis but does not provide an actual description of what was seen by the pathologist. I'm really not able to understand this We have also received information from the patient's dermatologist in [place] notes from April 2016. This was a doctor Agarwal-antal. 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 He was prescribed topical steroid high potency under occlusion which helped but at this point the patient did not have a deep punched out wound. 12/18/16; the patient's wound is larger in terms of surface area however this surface looks better and there is less depth. The surrounding erythema also is better. The patient states that the wrap we put on came off 2 days ago when he has been using his compression stockings. He we are in the process of getting a dermatology consult. 12/26/16 on evaluation today patient's left lower extremity wound shows evidence of infection with surrounding erythema noted. He has been tolerating the dressing changes but states that he has noted more discomfort. There is a larger area of erythema surrounding the wound. No fevers, chills, nausea, or vomiting noted at [facility], [name] ([id number]) this time. With that being said the wound still does have slough covering the surface. He is not allergic to any medication that he is aware of at this  point. In regard to his right lower extremity he had several regions that are erythematous and pruritic he wonders if there's anything we can do to help that. 01/02/17 I reviewed patient's wound culture which was obtained his visit last week. He was placed on doxycycline at that point. Unfortunately that does not appear to be  an antibiotic that would likely help with the situation however the pseudomonas noted on culture is sensitive to Cipro. Also unfortunately patient's wound seems to have a large compared to last week's evaluation. Not severely so but there are definitely increased measurements in general. He is continuing to have discomfort as well he writes this to be a seven out of 10. In fact he would prefer me not to perform any debridement today due to the fact that he is having discomfort and considering he has an active infection on the little reluctant to do so anyway. No fevers, chills, nausea, or vomiting noted at this time. 01/08/17; patient seems dermatology on September 5. I suspect dermatology will want the slides from the biopsy I did sent to their pathologist. I'm not sure if there is a way we can expedite that. In any case the culture I did before I left on vacation 3 weeks ago showed Pseudomonas he was given 10 days of Cipro and per her description of her intake nurses is actually somewhat better this week although the wound is quite a bit bigger than I remember the last time I saw this. He still has 3 more days of Cipro 01/21/17; dermatology appointment tomorrow. He has completed the ciprofloxacin for Pseudomonas. Surface of the wound looks better however he is had some deterioration in the lesions on his right leg. Meantime the left lateral leg wound we will continue with sample 01/29/17; patient had his dermatology appointment but I can't yet see that note. He is completed his antibiotics. The wound is more superficial but considerably larger in circumferential area than when he came in. This is in his left lateral calf. He also has swollen erythematous areas with superficial wounds on the right leg and small papular areas on both arms. There apparently areas in her his upper thighs and buttocks I did not look at those. Dermatology biopsied the right leg. Hopefully will have their input  next week. 02/05/17; patient went back to see his dermatologist who told him that he had a "scratching problem" as well as staph. He is now on a 30 day course of doxycycline and I believe she gave him triamcinolone cream to the right leg areas to help with the itching [not exactly sure but probably triamcinolone]. She apparently looked at the left lateral leg wound although this was not rebiopsied and I think felt to be ultimately part of the same pathogenesis. He is using sample border foam and changing nevus himself. He now has a new open area on the right posterior leg which was his biopsy site I don't have any of the dermatology notes Electronic Signature(s) Signed: 02/05/2017 5:33:43 PM By: Baltazar Najjar MD Entered By: Baltazar Najjar on 02/05/2017 09:36:12 [name] ([id number]) -------------------------------------------------------------------------------- Physical Exam Details Patient Name: [name] Date of Service: 02/05/2017 9:00 AM Medical Record Number: [id number] Patient Account Number: [id number] Date of Birth/Sex: May 09, 1981 (36 y.o. Male) Treating RN: Primary Care Provider: [name] Other Clinician: Referring Provider: [name] Treating Provider/Extender: Maxwell Caul Weeks in Treatment: 9 Constitutional Patient is hypertensive.. Pulse regular and within target range for patient.Marland Kitchen Respirations regular, non-labored and within target range.Marland Kitchen  Temperature is normal and within the target range for the patient.Marland Kitchen appears in no distress. Respiratory Respiratory effort is easy and symmetric bilaterally. Rate is normal at rest and on room air.. Cardiovascular Pedal pulses palpable and strong bilaterally.. Lymphatic None palpable in the popliteal or inguinal area. Psychiatric No evidence of depression, anxiety, or agitation. Calm, cooperative, and communicative. Appropriate interactions and affect.. Notes Wound examolateral left leg fairly large  but now more superficial wound. There is still a nonviable surface year and I think the Santyl is going to need to continue. Erythema around this we have been using triamcinolone 0.1% I'm going to put him back in compression rather than leave this for him to change oOn the right leg he has 3 superficial excoriations, the area medially now has a small punched out wound where the biopsy was done this does not have a viable surface either and was going to require [employer]) Signed: 02/05/2017 5:33:43 PM By: Baltazar Najjar MD Entered By: Baltazar Najjar on 02/05/2017 09:38:03 [name] ([id number]) -------------------------------------------------------------------------------- Physician Orders Details Patient Name: [name] Date of Service: 02/05/2017 9:00 AM Medical Record Number: [id number] Patient Account Number: [id number] Date of Birth/Sex: [birth year] (36 y.o. Male) Treating RN: Phillis Haggis Primary Care Provider: [name] Other Clinician: Referring Provider: [name] Treating Provider/Extender: Altamese [place] in Treatment: 9 Verbal / Phone Orders: No Diagnosis Coding Wound Cleansing Wound #1 Left,Lateral Lower Leg o May Shower, gently pat wound dry prior to applying new dressing. o No tub bath. Wound #2 Right,Posterior Lower Leg o May Shower, gently pat wound dry prior to applying new dressing. o No tub bath. Primary Wound Dressing Wound #1 Left,Lateral Lower Leg o Santyl Ointment Wound #2 Right,Posterior Lower Leg o Santyl Ointment o Other: - coban Secondary Dressing Wound #1 Left,Lateral Lower Leg o ABD pad o XtraSorb Wound #2 Right,Posterior Lower Leg o Boardered Foam Dressing Dressing Change Frequency Wound #1 Left,Lateral Lower Leg o Change dressing every week Wound #2 Right,Posterior Lower Leg o Change dressing every day. Follow-up Appointments Wound #1 Left,Lateral Lower Leg o  Return Appointment in 1 week. [name], [name] ([id number]) Edema Control Wound #1 Left,Lateral Lower Leg o 3 Layer Compression System - Left Lower Extremity - unna to anchor o Elevate legs to the level of the heart and pump ankles as often as possible Additional Orders / Instructions Wound #1 Left,Lateral Lower Leg o Increase protein intake. o Activity as tolerated Medications-please add to medication list. Wound #1 Left,Lateral Lower Leg o P.O. Antibiotics - Continnue Abx o Topical Antibiotic - TAC o Santyl Enzymatic Ointment Electronic Signature(s) Signed: 02/05/2017 5:33:43 PM By: Baltazar Najjar MD Signed: 02/05/2017 5:51:06 PM By: Alejandro Mulling Entered By: Alejandro Mulling on 02/05/2017 09:34:28 [name], [name] ([id number]) -------------------------------------------------------------------------------- Problem List Details Patient Name: [name] Date of Service: 02/05/2017 9:00 AM Medical Record Number: [id number] Patient Account Number: [id number] Date of Birth/Sex: 02-23-1981 (36 y.o. 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 Male) Treating  RN: Primary Care Provider: [name] Other Clinician: Referring Provider: [name] Treating Provider/Extender: Altamese [place] in Treatment: 9 Subjective History of Present Illness (HPI) 12/04/16; 36 year old man who comes into the clinic today for review of a wound on the posterior left calf. He tells me that is been there for about a year. He is not a diabetic he does smoke half a pack per day. He was seen in the ER on 11/20/16 felt to have cellulitis around the wound and was given clindamycin. An x-ray did not show osteomyelitis. The patient initially tells me that he has a milk allergy that sets off a pruritic itching rash on his lower legs which she scratches incessantly and he thinks that's what may have set up the wound. He has been using various topical antibiotics and ointments without any effect. He works in a [employer]ing [employer] and is on his feet all day. He does not have a prior history of wounds however he does have the rash on both lower legs the right arm and the ventral aspect of his left arm. These are excoriations and clearly have had scratching however there are of macular looking areas on both legs including a substantial larger area on the right leg. This does not have an underlying open area. There is no blistering. 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 He seemed to recognize this. 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 He is not in any treatment for this. He's had no recent travel 12/11/16; the patient arrives today with his wound and roughly the same condition we've been using silver alginate this is a deep punched out wound with some surrounding erythema but no tenderness. Biopsy I did did not show confirmed pyoderma gangrenosum  suggested nonspecific inflammation and vasculitis but does not provide an actual description of what was seen by the pathologist. I'm really not able to understand this We have also received information from the patient's dermatologist in [place] notes from April 2016. This was a doctor Agarwal-antal. 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 He was prescribed topical steroid high potency under occlusion which helped but at this point the patient did not have a deep punched out wound. 12/18/16; the patient's wound is larger in terms of surface area however this surface looks better and there is less depth. The surrounding erythema also is better. The patient states that the wrap we put on came off 2 days ago when he has been using his compression stockings. He we are in the process of getting a dermatology consult. 12/26/16 on evaluation today patient's left lower extremity wound shows evidence of infection with surrounding erythema noted. He has been tolerating the dressing changes but states that he has noted more discomfort. There is a larger area of erythema surrounding the wound. No fevers, chills, nausea, or vomiting noted at this time. With that being said the wound still does have slough covering the surface. He is not allergic to any medication that he is aware of at this point. In regard to his right lower extremity he had several regions that are erythematous and pruritic he wonders if there's anything we can do to help that. [name], [name] ([id number]) 01/02/17  I reviewed patient's wound culture which was obtained his visit last week. He was placed on doxycycline at that point. Unfortunately that does not appear to be an antibiotic that would likely help with the situation however the pseudomonas noted on culture is sensitive to Cipro. Also unfortunately patient's wound seems to have a large compared to last week's evaluation. Not severely so but there are  definitely increased measurements in general. He is continuing to have discomfort as well he writes this to be a seven out of 10. In fact he would prefer me not to perform any debridement today due to the fact that he is having discomfort and considering he has an active infection on the little reluctant to do so anyway. No fevers, chills, nausea, or vomiting noted at this time. 01/08/17; patient seems dermatology on September 5. I suspect dermatology will want the slides from the biopsy I did sent to their pathologist. I'm not sure if there is a way we can expedite that. In any case the culture I did before I left on vacation 3 weeks ago showed Pseudomonas he was given 10 days of Cipro and per her description of her intake nurses is actually somewhat better this week although the wound is quite a bit bigger than I remember the last time I saw this. He still has 3 more days of Cipro 01/21/17; dermatology appointment tomorrow. He has completed the ciprofloxacin for Pseudomonas. Surface of the wound looks better however he is had some deterioration in the lesions on his right leg. Meantime the left lateral leg wound we will continue with sample 01/29/17; patient had his dermatology appointment but I can't yet see that note. He is completed his antibiotics. The wound is more superficial but considerably larger in circumferential area than when he came in. This is in his left lateral calf. He also has swollen erythematous areas with superficial wounds on the right leg and small papular areas on both arms. There apparently areas in her his upper thighs and buttocks I did not look at those. Dermatology biopsied the right leg. Hopefully will have their input next week. 02/05/17; patient went back to see his dermatologist who told him that he had a "scratching problem" as well as staph. He is now on a 30 day course of doxycycline and I believe she gave him triamcinolone cream to the right leg areas to help  with the itching [not exactly sure but probably triamcinolone]. She apparently looked at the left lateral leg wound although this was not rebiopsied and I think felt to be ultimately part of the same pathogenesis. He is using sample border foam and changing nevus himself. He now has a new open area on the right posterior leg which was his biopsy site I don't have any of the dermatology notes Objective Constitutional Patient is hypertensive.. Pulse regular and within target range for patient.Marland Kitchen Respirations regular, non-labored and within target range.. Temperature is normal and within the target range for the patient.Marland Kitchen appears in no distress. Vitals Time Taken: 9:01 AM, Height: 71 in, Weight: 338 lbs, BMI: 47.1, Temperature: 98.1 F, Pulse: 92 bpm, Respiratory Rate: 18 breaths/min, Blood Pressure: 138/90 mmHg. [name], [name] ([id number]) Respiratory Respiratory effort is easy and symmetric bilaterally. Rate is normal at rest and on room air.. Cardiovascular Pedal pulses palpable and strong bilaterally.. Lymphatic None palpable in the popliteal or inguinal area. Psychiatric No evidence of depression, anxiety, or agitation. Calm, cooperative, and communicative. Appropriate interactions and affect.. General Notes: Wound exam lateral  left leg fairly large but now more superficial wound. There is still a nonviable surface year and I think the Santyl is going to need to continue. Erythema around this we have been using triamcinolone 0.1% I'm going to put him back in compression rather than leave this for him to change On the right leg he has 3 superficial excoriations, the area medially now has a small punched out wound where the biopsy was done this does not have a viable surface either and was going to require Santyl Integumentary (Hair, Skin) Wound #1 status is Open. Original cause of wound was Gradually Appeared. The wound is located on the Left,Lateral Lower Leg. The wound measures 4.5cm  length x 3.4cm width x 0.3cm depth; 12.017cm^2 area and 3.605cm^3 volume. There is Fat Layer (Subcutaneous Tissue) Exposed exposed. There is no tunneling or undermining noted. There is a large amount of serous drainage noted. The wound margin is flat and intact. There is medium (34-66%) red, pink granulation within the wound bed. There is a medium (34-66%) amount of necrotic tissue within the wound bed including Adherent Slough. The periwound skin appearance exhibited: Excoriation, Induration, Maceration, Rubor, Erythema. The periwound skin appearance did not exhibit: Callus, Crepitus, Rash, Scarring, Dry/Scaly, Atrophie Blanche, Cyanosis, Ecchymosis, Hemosiderin Staining, Mottled, Pallor. The surrounding wound skin color is noted with erythema which is circumferential. Periwound temperature was noted as Hot. Wound #2 status is Open. Original cause of wound was Surgical Injury. The wound is located on the Right,Posterior Lower Leg. The wound measures 0.9cm length x 0.7cm width x 0.2cm depth; 0.495cm^2 area and 0.099cm^3 volume. There is no tunneling or undermining noted. There is a large amount of serous drainage noted. The wound margin is distinct with the outline attached to the wound base. There is large (67-100%) red granulation within the wound bed. There is a small (1-33%) amount of necrotic tissue within the wound bed including Adherent Slough. The periwound skin appearance exhibited: Erythema. The surrounding wound skin color is noted with erythema which is circumferential. Periwound temperature was noted as No Abnormality. The periwound has tenderness on palpation. Assessment Active Problems [name], [name] ([id number]) ICD-10 [phone] - 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sis of muscle I87.2 - Venous insufficiency (chronic) (peripheral) Plan Wound Cleansing: Wound #1 Left,Lateral Lower Leg: May Shower, gently pat wound dry prior to applying new dressing. No tub  bath. Wound #2 Right,Posterior Lower Leg: May Shower, gently pat wound dry prior to applying new dressing. No tub bath. Primary Wound Dressing: Wound #1 Left,Lateral Lower Leg: Santyl Ointment Wound #2 Right,Posterior Lower Leg: Santyl Ointment Other: - coban Secondary Dressing: Wound #1 Left,Lateral Lower Leg: ABD pad XtraSorb Wound #2 Right,Posterior Lower Leg: Boardered Foam Dressing Dressing Change Frequency: Wound #1 Left,Lateral Lower Leg: Change dressing every week Wound #2 Right,Posterior Lower Leg: Change dressing every day. Follow-up Appointments: Wound #1 Left,Lateral Lower Leg: Return Appointment in 1 week. Edema Control: Wound #1 Left,Lateral Lower Leg: 3 Layer Compression System - Left Lower Extremity - unna to anchor Elevate legs to the level of the heart and pump ankles as often as possible Additional Orders / Instructions: Wound #1 Left,Lateral Lower Leg: Increase protein intake. Activity as tolerated Medications-please add to medication list.: Wound #1 Left,Lateral Lower Leg: P.O. Antibiotics - Continnue Abx Topical Antibiotic - TAC Brumm, [name] ([id number]) Santyl Enzymatic Ointment #1 santyl to both the wound areas #2 on the left leg continue TCA 0.1% #3 I put him in 3 layer compression this week which I would  like to leave on all week Electronic Signature(s) Signed: 02/05/2017 5:33:43 PM By: Baltazar Najjar MD Entered By: Baltazar Najjar on 02/05/2017 09:38:48 [name] ([id number]) -------------------------------------------------------------------------------- SuperBill Details Patient Name: [name] Date of Service: 02/05/2017 Medical Record Number: [id number] Patient Account Number: [id number] Date of Birth/Sex: Jul 13, 1980 (36 y.o. Male) Treating RN: Primary Care Provider: [name] Other Clinician: Referring Provider: [name] Treating Provider/Extender: Maxwell Caul Weeks in Treatment: 9 Diagnosis  Coding ICD-10 Codes Code Description [phone] 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sis of muscle I87.2 Venous insufficiency (chronic) (peripheral) Facility Procedures CPT4: Description Modifier Quantity Code [id number] (Facility Use Only) [phone] - APPLY MULTLAY COMPRS LWR LT 1 LEG Physician Procedures CPT4 Code: [id number] Description: 99213 - WC PHYS LEVEL 3 - EST PT ICD-10 Description Diagnosis L97.223 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 I87.2 Venous insufficiency (chronic) (peripheral) Modifier: sis of muscle Quantity: 1 Electronic Signature(s) Signed: 02/05/2017 5:33:43 PM By: Baltazar Najjar MD Signed: 02/05/2017 5:51:06 PM By: Alejandro Mulling Entered By: Alejandro Mulling on 02/05/2017 16:59:13

## 2017-02-07 NOTE — Progress Notes (Signed)
SHAHAN, STARKS ([id number]) Visit Report for 02/05/2017 Arrival Information Details Patient Name: [name], [name] Date of Service: 02/05/2017 9:00 AM Medical Record Number: [id number] Patient Account Number: [id number] Date of Birth/Sex: Mar 22, 1981 (36 y.o. Male) Treating RN: Ahmed Prima Primary Care Partick Musselman: [name] Other Clinician: Referring Alegandra Sommers: [name] Treating Jennafer Gladue/Extender: Tito Dine in Treatment: 9 Visit Information History Since Last Visit All ordered tests and consults were completed: No Patient Arrived: Ambulatory Added or deleted any medications: No Arrival Time: 09:01 Any new allergies or adverse reactions: No Accompanied By: self Had a fall or experienced change in No Transfer Assistance: None activities of daily living that may affect Patient Identification Verified: Yes risk of falls: Secondary Verification Process Yes Signs or symptoms of abuse/neglect since last No Completed: visito Patient Requires Transmission-Based No Hospitalized since last visit: No Precautions: Has Dressing in Place as Prescribed: Yes Patient Has Alerts: No Pain Present Now: No Electronic Signature(s) Signed: 02/05/2017 5:51:06 PM By: Alric Quan Entered By: Alric Quan on 02/05/2017 09:01:45 [name] ([id number]) -------------------------------------------------------------------------------- Encounter Discharge Information Details Patient Name: [name] Date of Service: 02/05/2017 9:00 AM Medical Record Number: [id number] Patient Account Number: [id number] Date of Birth/Sex: Nov 16, 1980 (36 y.o. Male) Treating RN: Ahmed Prima Primary Care Lawonda Pretlow: [name] Other Clinician: Referring Peggie Hornak: [name] Treating Neil Brickell/Extender: Tito Dine in Treatment: 9 Encounter Discharge Information Items Discharge Pain Level: 0 Discharge Condition: Stable Ambulatory Status: Ambulatory Discharge  Destination: Home Transportation: Private Auto Accompanied By: self Schedule Follow-up Appointment: Yes Medication Reconciliation completed and provided to Patient/Care No Linde Wilensky: Provided on Clinical Summary of Care: 02/05/2017 Form Type Recipient Paper Patient RL Electronic Signature(s) Signed: 02/06/2017 9:09:16 AM By: Ruthine Dose Entered By: Ruthine Dose on 02/05/2017 09:44:43 Blomberg, Gleb ([id number]) -------------------------------------------------------------------------------- Lower Extremity Assessment Details Patient Name: [name] Date of Service: 02/05/2017 9:00 AM Medical Record Number: [id number] Patient Account Number: [id number] Date of Birth/Sex: April 04, 1981 (36 y.o. Male) Treating RN: Ahmed Prima Primary Care Omarri Eich: [name] Other Clinician: Referring Mykelti Goldenstein: [name] Treating Amrit Cress/Extender: Ricard Dillon Weeks in Treatment: 9 Edema Assessment Assessed: [Left: No] [Right: No] E[Left: dema] [Right: :] Calf Left: Right: Point of Measurement: 33 cm From Medial Instep 52.1 cm 52.4 cm Ankle Left: Right: Point of Measurement: 11 cm From Medial Instep 30.3 cm 30 cm Vascular Assessment Pulses: Dorsalis Pedis Palpable: [Left:Yes] [Right:Yes] Posterior Tibial Palpable: [Left:Yes] [Right:Yes] Extremity colors, hair growth, and conditions: Extremity Color: [Left:Red] [Right:Red] Hair Growth on Extremity: [Left:Yes] [Right:Yes] Temperature of Extremity: [Left:Warm] [Right:Warm] Capillary Refill: [Left:< 3 seconds] [Right:< 3 seconds] Toe Nail Assessment Left: Right: Thick: No No Discolored: Yes Yes Deformed: No No Improper Length and Hygiene: No No Electronic Signature(s) Signed: 02/05/2017 5:51:06 PM By: Alric Quan Entered By: Alric Quan on 02/05/2017 09:10:45 Egolf, Samik ([id number]) [place], [name] ([id number]) -------------------------------------------------------------------------------- Multi Wound  Chart Details Patient Name: [name] Date of Service: 02/05/2017 9:00 AM Medical Record Number: [id number] Patient Account Number: [id number] Date of Birth/Sex: 12/04/1980 (35 y.o. Male) Treating RN: Ahmed Prima Primary Care Tayli [name]: [name] Other Clinician: Referring Ashla Murph: [name] Treating Ruthell Feigenbaum/Extender: Ricard Dillon Weeks in Treatment: 9 Vital Signs Height(in): 71 Pulse(bpm): 92 Weight(lbs): 338 Blood Pressure 138/90 (mmHg): Body Mass Index(BMI): 47 Temperature(F): 98.1 Respiratory Rate 18 (breaths/min): Photos: [1:No Photos] [2:No Photos] [N/A:N/A] Wound Location: [1:Left Lower Leg - Lateral] [2:Right Lower Leg - Posterior] [N/A:N/A] Wounding Event: [1:Gradually Appeared] [2:Surgical Injury] [N/A:N/A] Primary Etiology: [1:Vasculitis] [2:Open Surgical Wound] [N/A:N/A] Comorbid History: [1:Sleep Apnea, Hypertension, Colitis] [2:Sleep Apnea, Hypertension, Colitis] [  N/A:N/A] Date Acquired: [1:11/18/2015] [2:02/02/2017] [N/A:N/A] Weeks of Treatment: [1:9] [2:0] [N/A:N/A] Wound Status: [1:Open] [2:Open] [N/A:N/A] Measurements L x W x D 4.5x3.4x0.3 [2:0.9x0.7x0.2] [N/A:N/A] (cm) Area (cm) : [1:12.017] [2:0.495] [N/A:N/A] Volume (cm) : [1:3.605] [2:0.099] [N/A:N/A] % Reduction in Area: [1:-144.80%] [2:N/A] [N/A:N/A] % Reduction in Volume: 8.20% [2:N/A] [N/A:N/A] Classification: [1:Full Thickness Without Exposed Support Structures] [2:Partial Thickness] [N/A:N/A] Exudate Amount: [1:Large] [2:Large] [N/A:N/A] Exudate Type: [1:Serous] [2:Serous] [N/A:N/A] Exudate Color: [1:amber] [2:amber] [N/A:N/A] Wound Margin: [1:Flat and Intact] [2:Distinct, outline attached] [N/A:N/A] Granulation Amount: [1:Medium (34-66%)] [2:Large (67-100%)] [N/A:N/A] Granulation Quality: [1:Red, Pink] [2:Red] [N/A:N/A] Necrotic Amount: [1:Medium (34-66%)] [2:Small (1-33%)] [N/A:N/A] Exposed Structures: [1:Fat Layer (Subcutaneous Tissue) Exposed: Yes Fascia: No  Tendon: No] [2:N/A] [N/A:N/A] Muscle: No Joint: No Bone: No Epithelialization: None None N/A Periwound Skin Texture: Excoriation: Yes No Abnormalities Noted N/A Induration: Yes Callus: No Crepitus: No Rash: No Scarring: No Periwound Skin Maceration: Yes No Abnormalities Noted N/A Moisture: Dry/Scaly: No Periwound Skin Color: Erythema: Yes Erythema: Yes N/A Rubor: Yes Atrophie Blanche: No Cyanosis: No Ecchymosis: No Hemosiderin Staining: No Mottled: No Pallor: No Erythema Location: Circumferential Circumferential N/A Temperature: Hot No Abnormality N/A Tenderness on No Yes N/A Palpation: Wound Preparation: Ulcer Cleansing: Ulcer Cleansing: N/A Rinsed/Irrigated with Rinsed/Irrigated with Saline Saline Topical Anesthetic Topical Anesthetic Applied: Other: Lidocaine Applied: Other: lidocaine 4% 4% Treatment Notes Wound #1 (Left, Lateral Lower Leg) 1. Cleansed with: Clean wound with Normal Saline 2. Anesthetic Topical Lidocaine 4% cream to wound bed prior to debridement 4. Dressing Applied: Santyl Ointment 5. Secondary Dressing Applied ABD Pad 7. Secured with Tape 3 Layer Compression System - Left Lower Extremity Notes xtrasorb, unna to anchor Danh, Yazid ([id number]) Wound #2 (Right, Posterior Lower Leg) 1. Cleansed with: Clean wound with Normal Saline 2. Anesthetic Topical Lidocaine 4% cream to wound bed prior to debridement 4. Dressing Applied: Santyl Ointment 5. Secondary Dressing Applied Bordered Foam Dressing Dry Gauze Electronic Signature(s) Signed: 02/05/2017 5:33:43 PM By: Linton Ham MD Entered By: Linton Ham on 02/05/2017 09:34:24 [name] ([id number]) -------------------------------------------------------------------------------- [place] Details Patient Name: [name] Date of Service: 02/05/2017 9:00 AM Medical Record Number: [id number] Patient Account Number: [id number] Date of Birth/Sex: 02-Oct-1980 (36  y.o. Male) Treating RN: Ahmed Prima Primary Care Danniel Grenz: [name] Other Clinician: Referring Lorris Carducci: [name] Treating Walden [name]/Extender: Tito Dine in Treatment: 9 Active Inactive ` Orientation to the Wound Care Program Nursing Diagnoses: Knowledge deficit related to the wound healing center program Goals: Patient/caregiver will verbalize understanding of the [place] Program Date Initiated: 12/11/2016 Target Resolution Date: 03/13/2017 Goal Status: Active Interventions: Provide education on orientation to the wound center Notes: ` Venous Leg Ulcer Nursing Diagnoses: Knowledge deficit related to disease process and management Potential for venous Insuffiency (use before diagnosis confirmed) Goals: Non-invasive venous studies are completed as ordered Date Initiated: 12/11/2016 Target Resolution Date: 03/13/2017 Goal Status: Active Patient will maintain optimal edema control Date Initiated: 12/11/2016 Target Resolution Date: 03/13/2017 Goal Status: Active Patient/caregiver will verbalize understanding of disease process and disease management Date Initiated: 12/11/2016 Target Resolution Date: 03/13/2017 Goal Status: Active Verify adequate tissue perfusion prior to therapeutic compression application Date Initiated: 12/11/2016 Target Resolution Date: 03/13/2017 Goal Status: Active Konkle, Nathanuel ([id number]) Interventions: Assess peripheral edema status every visit. Compression as ordered Provide education on venous insufficiency Treatment Activities: Therapeutic compression applied : 12/11/2016 Notes: ` Wound/Skin Impairment Nursing Diagnoses: Impaired tissue integrity Knowledge deficit related to ulceration/compromised skin integrity Goals: Patient/caregiver will verbalize understanding of skin care regimen Date Initiated: 12/11/2016  Target Resolution Date: 03/13/2017 Goal Status: Active Ulcer/skin breakdown will have  a volume reduction of 30% by week 4 Date Initiated: 12/11/2016 Target Resolution Date: 03/13/2017 Goal Status: Active Ulcer/skin breakdown will have a volume reduction of 50% by week 8 Date Initiated: 12/11/2016 Target Resolution Date: 03/13/2017 Goal Status: Active Ulcer/skin breakdown will have a volume reduction of 80% by week 12 Date Initiated: 12/11/2016 Target Resolution Date: 03/13/2017 Goal Status: Active Ulcer/skin breakdown will heal within 14 weeks Date Initiated: 12/11/2016 Target Resolution Date: 03/13/2017 Goal Status: Active Interventions: Assess patient/caregiver ability to obtain necessary supplies Assess patient/caregiver ability to perform ulcer/skin care regimen upon admission and as needed Assess ulceration(s) every visit Provide education on ulcer and skin care Treatment Activities: Skin care regimen initiated : 12/11/2016 Topical wound management initiated : 12/11/2016 Notes: NELDON, SHEPARD ([id number]) Electronic Signature(s) Signed: 02/05/2017 5:51:06 PM By: Alric Quan Entered By: Alric Quan on 02/05/2017 09:10:55 Willig, [name] ([id number]) -------------------------------------------------------------------------------- Pain Assessment Details Patient Name: [name] Date of Service: 02/05/2017 9:00 AM Medical Record Number: [id number] Patient Account Number: [id number] Date of Birth/Sex: [birth year] (36 y.o. Male) Treating RN: Ahmed Prima Primary Care Jaliza Seifried: [name] Other Clinician: Referring Eyad Rochford: [name] Treating Graviel Payeur/Extender: Ricard Dillon Weeks in Treatment: 9 Active Problems Location of Pain Severity and Description of Pain Patient Has Paino No Site Locations Pain Management and Medication Current Pain Management: Electronic Signature(s) Signed: 02/05/2017 5:51:06 PM By: Alric Quan Entered By: Alric Quan on 02/05/2017 09:01:51 [name]  ([id number]) -------------------------------------------------------------------------------- Patient/Caregiver Education Details Patient Name: [name] Date of Service: 02/05/2017 9:00 AM Medical Record Number: [id number] Patient Account Number: [id number] Date of Birth/Gender: 1980-10-21 (36 y.o. Male) Treating RN: Ahmed Prima Primary Care Physician: [name] Other Clinician: Referring Physician: [name] Treating Physician/Extender: Tito Dine in Treatment: 9 Education Assessment Education Provided To: Patient Education Topics Provided Wound/Skin Impairment: Handouts: Other: change dressing as ordered, do not get wrap wet Methods: Demonstration, Explain/Verbal Responses: State content correctly Electronic Signature(s) Signed: 02/05/2017 5:51:06 PM By: Alric Quan Entered By: Alric Quan on 02/05/2017 09:35:37 Cawood, [name] ([id number]) -------------------------------------------------------------------------------- Wound Assessment Details Patient Name: [name] Date of Service: 02/05/2017 9:00 AM Medical Record Number: [id number] Patient Account Number: [id number] Date of Birth/Sex: 16-Aug-1980 (36 y.o. Male) Treating RN: Carolyne Fiscal, Debi Primary Care Braedan Meuth: [name] Other Clinician: Referring Harolyn Cocker: [name] Treating Keyonia Gluth/Extender: Ricard Dillon Weeks in Treatment: 9 Wound Status Wound Number: 1 Primary Etiology: Vasculitis Wound Location: Left Lower Leg - Lateral Wound Status: Open Wounding Event: Gradually Appeared Comorbid Sleep Apnea, Hypertension, History: Colitis Date Acquired: 11/18/2015 Weeks Of Treatment: 9 Clustered Wound: No Photos Photo Uploaded By: [name], [name], RN, [name], [name] on 02/05/2017 09:42:51 Wound Measurements Length: (cm) 4.5 Width: (cm) 3.4 Depth: (cm) 0.3 Area: (cm) 12.017 Volume: (cm) 3.605 % Reduction in Area: -144.8% % Reduction in Volume:  8.2% Epithelialization: None Tunneling: No Undermining: No Wound Description Full Thickness Without Exposed Classification: Support Structures Wound Margin: Flat and Intact Exudate Large Amount: Exudate Type: Serous Exudate Color: amber Foul Odor After Cleansing: No Slough/Fibrino Yes Wound Bed Granulation Amount: Medium (34-66%) Exposed Structure Granulation Quality: Red, Pink Fascia Exposed: No Necrotic Amount: Medium (34-66%) Fat Layer (Subcutaneous Tissue) Exposed: Yes Necrotic Quality: Adherent Slough Tendon Exposed: No Muscle Exposed: No Ballard, Aarnav ([id number]) Joint Exposed: No Bone Exposed: No Periwound Skin Texture Texture Color No Abnormalities Noted: No No Abnormalities Noted: No Callus: No Atrophie Blanche: No Crepitus: No Cyanosis: No Excoriation: Yes Ecchymosis: No Induration: Yes Erythema: Yes Rash: No  Erythema Location: Circumferential Scarring: No Hemosiderin Staining: No Mottled: No Moisture Pallor: No No Abnormalities Noted: No Rubor: Yes Dry / Scaly: No Maceration: Yes Temperature / Pain Temperature: Hot Wound Preparation Ulcer Cleansing: Rinsed/Irrigated with Saline Topical Anesthetic Applied: Other: Lidocaine 4%, Treatment Notes Wound #1 (Left, Lateral Lower Leg) 1. Cleansed with: Clean wound with Normal Saline 2. Anesthetic Topical Lidocaine 4% cream to wound bed prior to debridement 4. Dressing Applied: Santyl Ointment 5. Secondary Dressing Applied ABD Pad 7. Secured with Tape 3 Layer Compression System - Left Lower Extremity Notes xtrasorb, unna to Engineer, production) Signed: 02/05/2017 5:51:06 PM By: Alric Quan Entered By: Alric Quan on 02/05/2017 09:08:56 Wilcock, [name] ([id number]) -------------------------------------------------------------------------------- Wound Assessment Details Patient Name: [name] Date of Service: 02/05/2017 9:00 AM Medical Record Number: [id number] Patient  Account Number: [id number] Date of Birth/Sex: 1981/04/22 (36 y.o. Male) Treating RN: Ahmed Prima Primary Care Tammara Massing: [name] Other Clinician: Referring Oliviya Gilkison: [name] Treating Dim Meisinger/Extender: Ricard Dillon Weeks in Treatment: 9 Wound Status Wound Number: 2 Primary Etiology: Open Surgical Wound Wound Location: Right Lower Leg - Posterior Wound Status: Open Wounding Event: Surgical Injury Comorbid Sleep Apnea, Hypertension, History: Colitis Date Acquired: 02/02/2017 Weeks Of Treatment: 0 Clustered Wound: No Photos Photo Uploaded By: [name], [name], RN, [name], [name] on 02/05/2017 09:42:52 Wound Measurements Length: (cm) 0.9 Width: (cm) 0.7 Depth: (cm) 0.2 Area: (cm) 0.495 Volume: (cm) 0.099 % Reduction in Area: % Reduction in Volume: Epithelialization: None Tunneling: No Undermining: No Wound Description Classification: Partial Thickness Wound Margin: Distinct, outline attached Exudate Amount: Large Exudate Type: Serous Exudate Color: amber Foul Odor After Cleansing: No Slough/Fibrino Yes Wound Bed Granulation Amount: Large (67-100%) Granulation Quality: Red Necrotic Amount: Small (1-33%) Necrotic Quality: Adherent Slough Periwound Skin Texture Texture Color Doescher, Rogers ([id number]) No Abnormalities Noted: No No Abnormalities Noted: No Erythema: Yes Moisture Erythema Location: Circumferential No Abnormalities Noted: No Temperature / Pain Temperature: No Abnormality Tenderness on Palpation: Yes Wound Preparation Ulcer Cleansing: Rinsed/Irrigated with Saline Topical Anesthetic Applied: Other: lidocaine 4%, Treatment Notes Wound #2 (Right, Posterior Lower Leg) 1. Cleansed with: Clean wound with Normal Saline 2. Anesthetic Topical Lidocaine 4% cream to wound bed prior to debridement 4. Dressing Applied: Santyl Ointment 5. Secondary Dressing Applied Bordered Foam Dressing Dry Gauze Notes coban Electronic  Signature(s) Signed: 02/05/2017 5:51:06 PM By: Alric Quan Entered By: Alric Quan on 02/05/2017 09:08:29 [name] ([id number]) -------------------------------------------------------------------------------- [place] Details Patient Name: [name] Date of Service: 02/05/2017 9:00 AM Medical Record Number: [id number] Patient Account Number: [id number] Date of Birth/Sex: 1981/04/03 (36 y.o. Male) Treating RN: Ahmed Prima Primary Care Azariel Banik: [name] Other Clinician: Referring Leatta Alewine: [name] Treating Blanch Stang/Extender: Tito Dine in Treatment: 9 Vital Signs Time Taken: 09:01 Temperature (F): 98.1 Height (in): 71 Pulse (bpm): 92 Weight (lbs): 338 Respiratory Rate (breaths/min): 18 Body Mass Index (BMI): 47.1 Blood Pressure (mmHg): 138/90 Reference Range: 80 - 120 mg / dl Electronic Signature(s) Signed: 02/05/2017 5:51:06 PM By: Alric Quan Entered By: Alric Quan on 02/05/2017 09:02:57

## 2017-02-12 ENCOUNTER — Encounter: Payer: 59 | Admitting: Internal Medicine

## 2017-02-12 DIAGNOSIS — L97223 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sis of muscle: Secondary | ICD-10-CM | POA: Diagnosis not present

## 2017-02-12 DIAGNOSIS — T8189XA Other complications of procedures, not elsewhere classified, initial encounter: Secondary | ICD-10-CM | POA: Diagnosis not present

## 2017-02-13 NOTE — Progress Notes (Signed)
REG, BIRCHER ([id number]) Visit Report for 02/12/2017 Debridement Details Patient Name: [name], [name] Date of Service: 02/12/2017 3:30 PM Medical Record Number: [id number] Patient Account Number: [id number] Date of Birth/Sex: 03-Mar-1981 (36 y.o. Male) Treating RN: Huel Coventry Primary Care Provider: [name] Other Clinician: Referring Provider: [name] Treating Provider/Extender: Altamese Arispe in Treatment: 10 Debridement Performed for Wound #2 Right,Posterior Lower Leg Assessment: Performed By: Physician Maxwell Caul, MD Debridement: Debridement Pre-procedure Verification/Time Out Yes - 16:01 Taken: Start Time: 16:01 Pain Control: Other : lidocaine 4% Level: Skin/Subcutaneous Tissue Total Area Debrided (L x 0.8 (cm) x 0.8 (cm) = 0.64 (cm) W): Tissue and other Viable, Non-Viable, Fibrin/Slough, Subcutaneous material debrided: Instrument: Curette Bleeding: Minimum Hemostasis Achieved: Pressure End Time: 16:02 Procedural Pain: 1 Post Procedural Pain: 1 Response to Treatment: Procedure was tolerated well Post Debridement Measurements of Total Wound Length: (cm) 0.8 Width: (cm) 0.8 Depth: (cm) 0.4 Volume: (cm) 0.201 Character of Wound/Ulcer Post Stable Debridement: Post Procedure Diagnosis Same as Pre-procedure Electronic Signature(s) Signed: 02/12/2017 4:25:24 PM By: Elliot Gurney, [name], RN, [name], [name], [name] Signed: 02/12/2017 4:28:59 PM By: Baltazar Najjar MD [name], [name] ([id number]) Entered By: Baltazar Najjar on 02/12/2017 16:19:48 [name] ([id number]) -------------------------------------------------------------------------------- HPI Details Patient Name: [name] Date of Service: 02/12/2017 3:30 PM Medical Record Number: [id number] Patient Account Number: [id number] Date of Birth/Sex: May 27, 1980 (36 y.o. Male) Treating RN: Huel Coventry Primary Care Provider: [name] Other Clinician: Referring Provider: [name] Treating Provider/Extender: Maxwell Caul Weeks in Treatment: 10 History of Present Illness HPI Description: 12/04/16; 36 year old man who comes into the clinic today for review of a wound on the posterior left calf. He tells me that is been there for about a year. He is not a diabetic he does smoke half a pack per day. He was seen in the ER on 11/20/16 felt to have cellulitis around the wound and was given clindamycin. An x-ray did not show osteomyelitis. The patient initially tells me that he has a milk allergy that sets off a pruritic itching rash on his lower legs which she scratches incessantly and he thinks that's what may have set up the wound. He has been using various topical antibiotics and ointments without any effect. He works in a [employer]ing [employer] and is on his feet all day. He does not have a prior history of wounds however he does have the rash on both lower legs the right arm and the ventral aspect of his left arm. These are excoriations and clearly have had scratching however there are of macular looking areas on both legs including a substantial larger area on the right leg. This does not have an underlying open area. There is no blistering. 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 He seemed to recognize this. 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 He is not in any treatment for this. He's had no recent travel 12/11/16; the patient arrives today with his wound and roughly the same condition we've been using silver alginate this is a deep punched out wound with some surrounding erythema but no tenderness. Biopsy I did did not show confirmed pyoderma gangrenosum suggested nonspecific inflammation and vasculitis but does not provide an actual  description of what was seen by the  pathologist. I'm really not able to understand this We have also received information from the patient's dermatologist in [place] notes from April 2016. This was a doctor Agarwal-antal. 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 He was prescribed topical steroid high potency under occlusion which helped but at this point the patient did not have a deep punched out wound. 12/18/16; the patient's wound is larger in terms of surface area however this surface looks better and there is less depth. The surrounding erythema also is better. The patient states that the wrap we put on came off 2 days ago when he has been using his compression stockings. He we are in the process of getting a dermatology consult. 12/26/16 on evaluation today patient's left lower extremity wound shows evidence of infection with surrounding erythema noted. He has been tolerating the dressing changes but states that he has noted more discomfort. There is a larger area of erythema surrounding the wound. No fevers, chills, nausea, or vomiting noted at this time. With that being said the wound still does have slough covering the surface. He is not allergic to any medication that he is aware of at this point. In regard to his right lower extremity he had several regions that are erythematous and pruritic he wonders if there's anything we can do to help that. [name], [name] ([id number]) 01/02/17 I reviewed patient's wound culture which was obtained his visit last week. He was placed on doxycycline at that point. Unfortunately that does not appear to be an antibiotic that would likely help with the situation however the pseudomonas noted on culture is sensitive to Cipro. Also unfortunately patient's wound seems to have a large compared to last week's evaluation. Not severely so but there are definitely increased measurements in general. He is continuing to have discomfort as well he writes  this to be a seven out of 10. In fact he would prefer me not to perform any debridement today due to the fact that he is having discomfort and considering he has an active infection on the little reluctant to do so anyway. No fevers, chills, nausea, or vomiting noted at this time. 01/08/17; patient seems dermatology on September 5. I suspect dermatology will want the slides from the biopsy I did sent to their pathologist. I'm not sure if there is a way we can expedite that. In any case the culture I did before I left on vacation 3 weeks ago showed Pseudomonas he was given 10 days of Cipro and per her description of her intake nurses is actually somewhat better this week although the wound is quite a bit bigger than I remember the last time I saw this. He still has 3 more days of Cipro 01/21/17; dermatology appointment tomorrow. He has completed the ciprofloxacin for Pseudomonas. Surface of the wound looks better however he is had some deterioration in the lesions on his right leg. Meantime the left lateral leg wound we will continue with sample 01/29/17; patient had his dermatology appointment but I can't yet see that note. He is completed his antibiotics. The wound is more superficial but considerably larger in circumferential area than when he came in. This is in his left lateral calf. He also has swollen erythematous areas with superficial wounds on the right leg and small papular areas on both arms. There apparently areas in her his upper thighs and buttocks I did not look at those. Dermatology biopsied the right leg. Hopefully will have their input next week. 02/05/17; patient went  back to see his dermatologist who told him that he had a "scratching problem" as well as staph. He is now on a 30 day course of doxycycline and I believe she gave him triamcinolone cream to the right leg areas to help with the itching [not exactly sure but probably triamcinolone]. She apparently looked at the left  lateral leg wound although this was not rebiopsied and I think felt to be ultimately part of the same pathogenesis. He is using sample border foam and changing nevus himself. He now has a new open area on the right posterior leg which was his biopsy site I don't have any of the dermatology notes 02/12/17; we put the patient in compression last week with SANTYL to the wound on the left leg and the biopsy. Edema is much better and the depth of the wound is now at level of skin. Area is still the same oBiopsy site on the right lateral leg we've also been using santyl with a border foam dressing and he is changing this himself. Electronic Signature(s) Signed: 02/12/2017 4:28:59 PM By: Baltazar Najjar MD Entered By: Baltazar Najjar on 02/12/2017 16:21:47 [name] ([id number]) -------------------------------------------------------------------------------- Physical Exam Details Patient Name: [name] Date of Service: 02/12/2017 3:30 PM Medical Record Number: [id number] Patient Account Number: [id number] Date of Birth/Sex: [birth year] (36 y.o. Male) Treating RN: Huel Coventry Primary Care Provider: [name] Other Clinician: Referring Provider: [name] Treating Provider/Extender: Maxwell Caul Weeks in Treatment: 10 Constitutional Sitting or standing Blood Pressure is within target range for patient.. Pulse regular and within target range for patient.Marland Kitchen Respirations regular, non-labored and within target range.. Temperature is normal and within the target range for the patient.Marland Kitchen appears in no distress. Eyes Conjunctivae clear. No discharge. Respiratory Respiratory effort is easy and symmetric bilaterally. Rate is normal at rest and on room air.. Cardiovascular Pedal pulses palpable and strong bilaterally.. H better edema control in the left leg under compression also better erythema. Lymphatic None palpable in the popliteal or inguinal area. Integumentary (Hair,  Skin) The excoriations on the right leg are a lot better and he is been refraining from scratching. Psychiatric No evidence of depression, anxiety, or agitation. Calm, cooperative, and communicative. Appropriate interactions and affect.. Notes Wound exam; he has much better edema control in the left leg a large wound that is come down to the level surface with a skin i.e. no depth. Base of this looks reasonably healthy. No debridement here. Also the degree of inflammation around this wound is a lot better oHis biopsy site on the right leg has tightly adherent surface eschar Electronic Signature(s) Signed: 02/12/2017 4:28:59 PM By: Baltazar Najjar MD Entered By: Baltazar Najjar on 02/12/2017 16:24:09 [name] ([id number]) -------------------------------------------------------------------------------- Physician Orders Details Patient Name: [name] Date of Service: 02/12/2017 3:30 PM Medical Record Number: [id number] Patient Account Number: [id number] Date of Birth/Sex: [birth year] (36 y.o. Male) Treating RN: Huel Coventry Primary Care Provider: [name] Other Clinician: Referring Provider: [name] Treating Provider/Extender: Maxwell Caul Weeks in Treatment: 10 Verbal / Phone Orders: No Diagnosis Coding Wound Cleansing Wound #1 Left,Lateral Lower Leg o May Shower, gently pat wound dry prior to applying new dressing. o No tub bath. Wound #2 Right,Posterior Lower Leg o May Shower, gently pat wound dry prior to applying new dressing. o No tub bath. Primary Wound Dressing Wound #1 Left,Lateral Lower Leg o Aquacel Ag Wound #2 Right,Posterior Lower Leg o Aquacel Ag Secondary Dressing Wound #1 Left,Lateral Lower Leg o ABD pad Wound #2  Right,Posterior Lower Leg o Boardered Foam Dressing Dressing Change Frequency Wound #1 Left,Lateral Lower Leg o Change dressing every week Wound #2 Right,Posterior Lower Leg o Change dressing every other  day. Follow-up Appointments Wound #1 Left,Lateral Lower Leg o Return Appointment in 1 week. Wound #2 Right,Posterior Lower Leg o Return Appointment in 1 week. [name], [name] ([id number]) Edema Control Wound #1 Left,Lateral Lower Leg o 3 Layer Compression System - Left Lower Extremity - unna to anchor o Elevate legs to the level of the heart and pump ankles as often as possible Additional Orders / Instructions Wound #1 Left,Lateral Lower Leg o Increase protein intake. o Activity as tolerated Medications-please add to medication list. Wound #1 Left,Lateral Lower Leg o P.O. Antibiotics - Continnue Abx o Topical Antibiotic - TAC o Santyl Enzymatic Ointment Electronic Signature(s) Signed: 02/12/2017 4:25:24 PM By: Elliot Gurney, [name], RN, [name], [name], [name] Signed: 02/12/2017 4:28:59 PM By: Baltazar Najjar MD Entered By: Elliot Gurney, [name], RN, [name], [name] on 02/12/2017 16:17:41 [name], [name] ([id number]) -------------------------------------------------------------------------------- Progress Note Details Patient Name: [name] Date of Service: 02/12/2017 3:30 PM Medical Record Number: [id number] Patient Account Number: [id number] Date of Birth/Sex: 09-25-1980 (36 y.o. Male) Treating RN: Huel Coventry Primary Care Provider: [name] Other Clinician: Referring Provider: [name] Treating Provider/Extender: Maxwell Caul Weeks in Treatment: 10 Subjective History of Present Illness (HPI) 12/04/16; 36 year old man who comes into the clinic today for review of a wound on the posterior left calf. He tells me that is been there for about a year. He is not a diabetic he does smoke half a pack per day. He was seen in the ER on 11/20/16 felt to have cellulitis around the wound and was given clindamycin. An x-ray did not show osteomyelitis. The patient initially tells me that he has a milk allergy that sets off a pruritic itching rash on his lower legs which she scratches  incessantly and he thinks that's what may have set up the wound. He has been using various topical antibiotics and ointments without any effect. He works in a [employer]ing [employer] and is on his feet all day. He does not have a prior history of wounds however he does have the rash on both lower legs the right arm and the ventral aspect of his left arm. These are excoriations and clearly have had scratching however there are of macular looking areas on both legs including a substantial larger area on the right leg. This does not have an underlying open area. There is no blistering. 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 He seemed to recognize this. 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 He is not in any treatment for this. He's had no recent travel 12/11/16; the patient arrives today with his wound and roughly the same condition we've been using silver alginate this is a deep punched out wound with some surrounding erythema but no tenderness. Biopsy I did did not show confirmed pyoderma gangrenosum suggested nonspecific inflammation and vasculitis but does not provide an actual description of what was seen by the pathologist. I'm really not able to understand this We have also received information from the patient's dermatologist in [place] notes from April 2016. This was a doctor Agarwal-antal. The diagnosis seems to have been lichen  simplex chronicus. He was prescribed topical steroid high potency under occlusion which helped but at this point the patient did not have a deep punched out wound. 12/18/16; the patient's wound is larger in terms of surface area however this surface looks better and there is less depth. The surrounding erythema also is better. The patient states that the  wrap we put on came off 2 days ago when he has been using his compression stockings. He we are in the process of getting a dermatology consult. 12/26/16 on evaluation today patient's left lower extremity wound shows evidence of infection with surrounding erythema noted. He has been tolerating the dressing changes but states that he has noted more discomfort. There is a larger area of erythema surrounding the wound. No fevers, chills, nausea, or vomiting noted at this time. With that being said the wound still does have slough covering the surface. He is not allergic to any medication that he is aware of at this point. In regard to his right lower extremity he had several regions that are erythematous and pruritic he wonders if there's anything we can do to help that. [name], [name] ([id number]) 01/02/17 I reviewed patient's wound culture which was obtained his visit last week. He was placed on doxycycline at that point. Unfortunately that does not appear to be an antibiotic that would likely help with the situation however the pseudomonas noted on culture is sensitive to Cipro. Also unfortunately patient's wound seems to have a large compared to last week's evaluation. Not severely so but there are definitely increased measurements in general. He is continuing to have discomfort as well he writes this to be a seven out of 10. In fact he would prefer me not to perform any debridement today due to the fact that he is having discomfort and considering he has an active infection on the little reluctant to do so anyway. No fevers, chills, nausea, or vomiting noted at this time. 01/08/17; patient seems dermatology on September 5. I suspect dermatology will want the slides from the biopsy I did sent to their pathologist. I'm not sure if there is a way we can expedite that. In any case the culture I did before I left on vacation 3 weeks ago showed Pseudomonas he was given 10 days of Cipro and per her  description of her intake nurses is actually somewhat better this week although the wound is quite a bit bigger than I remember the last time I saw this. He still has 3 more days of Cipro 01/21/17; dermatology appointment tomorrow. He has completed the ciprofloxacin for Pseudomonas. Surface of the wound looks better however he is had some deterioration in the lesions on his right leg. Meantime the left lateral leg wound we will continue with sample 01/29/17; patient had his dermatology appointment but I can't yet see that note. He is completed his antibiotics. The wound is more superficial but considerably larger in circumferential area than when he came in. This is in his left lateral calf. He also has swollen erythematous areas with superficial wounds on the right leg and small papular areas on both arms. There apparently areas in her his upper thighs and buttocks I did not look at those. Dermatology biopsied the right leg. Hopefully will have their input next week. 02/05/17; patient went back to see his dermatologist who told him that he had a "scratching problem" as well as staph. He is now on a 30 day course of doxycycline and I believe she gave him  triamcinolone cream to the right leg areas to help with the itching [not exactly sure but probably triamcinolone]. She apparently looked at the left lateral leg wound although this was not rebiopsied and I think felt to be ultimately part of the same pathogenesis. He is using sample border foam and changing nevus himself. He now has a new open area on the right posterior leg which was his biopsy site I don't have any of the dermatology notes 02/12/17; we put the patient in compression last week with SANTYL to the wound on the left leg and the biopsy. Edema is much better and the depth of the wound is now at level of skin. Area is still the same Biopsy site on the right lateral leg we've also been using santyl with a border foam dressing and he  is changing this himself. Objective Constitutional Sitting or standing Blood Pressure is within target range for patient.. Pulse regular and within target range for patient.Marland Kitchen Respirations regular, non-labored and within target range.. Temperature is normal and within the target range for the patient.Marland Kitchen appears in no distress. [name], [name] ([id number]) Vitals Time Taken: 3:45 PM, Height: 71 in, Weight: 338 lbs, BMI: 47.1, Temperature: 98.4 F, Pulse: 97 bpm, Respiratory Rate: 18 breaths/min, Blood Pressure: 135/78 mmHg. Eyes Conjunctivae clear. No discharge. Respiratory Respiratory effort is easy and symmetric bilaterally. Rate is normal at rest and on room air.. Cardiovascular Pedal pulses palpable and strong bilaterally.. H better edema control in the left leg under compression also better erythema. Lymphatic None palpable in the popliteal or inguinal area. Psychiatric No evidence of depression, anxiety, or agitation. Calm, cooperative, and communicative. Appropriate interactions and affect.. General Notes: Wound exam; he has much better edema control in the left leg a large wound that is come down to the level surface with a skin i.e. no depth. Base of this looks reasonably healthy. No debridement here. Also the degree of inflammation around this wound is a lot better His biopsy site on the right leg has tightly adherent surface eschar Integumentary (Hair, Skin) The excoriations on the right leg are a lot better and he is been refraining from scratching. Wound #1 status is Open. Original cause of wound was Gradually Appeared. The wound is located on the Left,Lateral Lower Leg. The wound measures 4.3cm length x 4cm width x 0.2cm depth; 13.509cm^2 area and 2.702cm^3 volume. Wound #2 status is Open. Original cause of wound was Surgical Injury. The wound is located on the Right,Posterior Lower Leg. The wound measures 0.8cm length x 0.8cm width x 0.3cm depth; 0.503cm^2 area and  0.151cm^3 volume. Procedures Wound #2 Pre-procedure diagnosis of Wound #2 is an Open Surgical Wound located on the Right,Posterior Lower Leg . There was a Skin/Subcutaneous Tissue Debridement ([id number]) debridement with total area of 0.64 sq cm performed by Maxwell Caul, MD. with the following instrument(s): Curette to remove Viable and Non-Viable tissue/material including Fibrin/Slough and Subcutaneous after achieving pain [name], [name] ([id number]) control using Other (lidocaine 4%). A time out was conducted at 16:01, prior to the start of the procedure. A Minimum amount of bleeding was controlled with Pressure. The procedure was tolerated well with a pain level of 1 throughout and a pain level of 1 following the procedure. Post Debridement Measurements: 0.8cm length x 0.8cm width x 0.4cm depth; 0.201cm^3 volume. Character of Wound/Ulcer Post Debridement is stable. 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  There was a Three Layer Compression Therapy Procedure with a pre-treatment ABI of 1.2 by Huel Coventry, RN. Post procedure Diagnosis Wound #1: Same as Pre-Procedure Plan Wound Cleansing: Wound #1 Left,Lateral Lower Leg: May Shower, gently pat wound dry prior to applying new dressing. No tub bath. Wound #2 Right,Posterior Lower Leg: May Shower, gently pat wound dry prior to applying new dressing. No tub bath. Primary Wound Dressing: Wound #1 Left,Lateral Lower Leg: Aquacel Ag Wound #2 Right,Posterior Lower Leg: Aquacel Ag Secondary Dressing: Wound #1 Left,Lateral Lower Leg: ABD pad Wound #2 Right,Posterior Lower Leg: Boardered Foam Dressing Dressing Change Frequency: Wound #1 Left,Lateral Lower Leg: Change dressing every week Wound #2 Right,Posterior Lower Leg: Change dressing every other day. Follow-up Appointments: Wound #1 Left,Lateral Lower  Leg: Return Appointment in 1 week. Wound #2 Right,Posterior Lower Leg: Return Appointment in 1 week. Edema Control: Wound #1 Left,Lateral Lower Leg: 3 Layer Compression System - Left Lower Extremity - unna to anchor [name], [name] ([id number]) Elevate legs to the level of the heart and pump ankles as often as possible Additional Orders / Instructions: Wound #1 Left,Lateral Lower Leg: Increase protein intake. Activity as tolerated Medications-please add to medication list.: Wound #1 Left,Lateral Lower Leg: P.O. Antibiotics - Continnue Abx Topical Antibiotic - TAC Santyl Enzymatic Ointment #1 large left lateral lower leg wound looks quite a bit better with compression. Depth of the wound is down in the surrounding erythema is better. We'll continue with TCA and I'm changing the silver alginate on both wounds this week, Hydrofera Blue might be a reasonable alternative #2 to the right leg we will continue with silver alginate post debridement under border foam which the patient can change himself Electronic Signature(s) Signed: 02/12/2017 4:28:59 PM By: Baltazar Najjar MD Entered By: Baltazar Najjar on 02/12/2017 16:25:26 [name] ([id number]) -------------------------------------------------------------------------------- SuperBill Details Patient Name: [name] Date of Service: 02/12/2017 Medical Record Number: [id number] Patient Account Number: [id number] Date of Birth/Sex: 14-Mar-1981 (36 y.o. 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 insufficiency (chronic) (peripheral) Disruption of external operation (surgical) wound, not elsewhere classified, subsequent T81.31XD encounter Facility  Procedures CPT4: Description Modifier Quantity Code [id number] 11042 - DEB SUBQ TISSUE 20 SQ CM/< 1 ICD-10 Description Diagnosis T81.31XD Disruption of external operation (surgical) wound, not elsewhere classified, subsequent encounter CPT4: [id number] (Facility Use Only) 10272ZD - APPLY MULTLAY COMPRS LWR LT 1 LEG Physician Procedures CPT4: Description Modifier Quantity Code [id number] 11042 - WC PHYS SUBQ TISS 20 SQ CM 1 ICD-10 Description Diagnosis T81.31XD Disruption of external operation (surgical) wound, not elsewhere classified, subsequent encounter Electronic Signature(s) Signed: 02/12/2017 4:28:59 PM By: Baltazar Najjar MD Previous Signature: 02/12/2017 4:25:24 PM Version By: Elliot Gurney, [name], RN, [name], [name], [name] Entered By: Baltazar Najjar on 02/12/2017 16:27:31

## 2017-02-13 NOTE — Progress Notes (Signed)
HISAO, DOO ([id number]) Visit Report for 02/12/2017 Arrival Information Details Patient Name: [name], [name] Date of Service: 02/12/2017 3:30 PM Medical Record Number: [id number] Patient Account Number: [id number] Date of Birth/Sex: 1981/04/09 (36 y.o. Male) Treating RN: [name] Primary Care Marquel Pottenger: [name] Other Clinician: Referring Mahamud Metts: [name] Treating Dema Timmons/Extender: Tito Dine in Treatment: 10 Visit Information History Since Last Visit Any new allergies or adverse reactions: No Patient Arrived: Ambulatory Had a fall or experienced change in No Arrival Time: 15:44 activities of daily living that may affect Accompanied By: self risk of falls: Transfer Assistance: None Signs or symptoms of abuse/neglect since last No Patient Identification Verified: Yes visito Secondary Verification Process Yes Hospitalized since last visit: No Completed: Has Dressing in Place as Prescribed: Yes Patient Requires Transmission-Based No Has Compression in Place as Prescribed: Yes Precautions: Pain Present Now: No Patient Has Alerts: No Electronic Signature(s) Signed: 02/12/2017 4:25:24 PM By: [name], [name], RN, [name], [name], [name] Entered By: [name], [name], RN, [name], [name] on 02/12/2017 15:44:56 [name] ([id number]) -------------------------------------------------------------------------------- Compression Therapy Details Patient Name: [name] Date of Service: 02/12/2017 3:30 PM Medical Record Number: [id number] Patient Account Number: [id number] Date of Birth/Sex: 07-07-1980 (36 y.o. Male) Treating RN: [name] Primary Care Lorenz Donley: [name] Other Clinician: Referring Loda Bialas: [name] Treating Coreyon Nicotra/Extender: Ricard Dillon Weeks in Treatment: 10 Compression Therapy Performed for Wound Wound #1 Left,Lateral Lower Leg Assessment: Performed By: Clinician [name], RN Compression Type: Three Layer Pre Treatment ABI:  1.2 Post Procedure Diagnosis Same as Pre-procedure Electronic Signature(s) Signed: 02/12/2017 4:25:24 PM By: [name], [name], RN, [name], [name], [name] Entered By: [name], [name], RN, [name], [name] on 02/12/2017 16:20:04 [name] ([id number]) -------------------------------------------------------------------------------- Encounter Discharge Information Details Patient Name: [name] Date of Service: 02/12/2017 3:30 PM Medical Record Number: [id number] Patient Account Number: [id number] Date of Birth/Sex: 1980-09-27 (36 y.o. Male) Treating RN: [name] Primary Care Jakel Alphin: [name] Other Clinician: Referring Triston Skare: [name] Treating Akeya Ryther/Extender: Tito Dine in Treatment: 10 Encounter Discharge Information Items Discharge Pain Level: 0 Discharge Condition: Stable Ambulatory Status: Ambulatory Discharge Destination: Home Transportation: Private Auto Accompanied By: self Schedule Follow-up Appointment: Yes Medication Reconciliation completed Yes and provided to Patient/Care Jesus Poplin: Patient Clinical Summary of Care: Declined Electronic Signature(s) Signed: 02/12/2017 4:25:24 PM By: [name], [name], RN, [name], [name], [name] Previous Signature: 02/12/2017 4:20:16 PM Version By: Ruthine Dose Entered By: [name] [name], RN, [name], [name] on 02/12/2017 16:21:52 [name] ([id number]) -------------------------------------------------------------------------------- Lower Extremity Assessment Details Patient Name: [name] Date of Service: 02/12/2017 3:30 PM Medical Record Number: [id number] Patient Account Number: [id number] Date of Birth/Sex: 11/08/1980 (36 y.o. Male) Treating RN: [name] Primary Care Lucinda Spells: [name] Other Clinician: Referring Beaumont Austad: [name] Treating Ambur [place]/Extender: Ricard Dillon Weeks in Treatment: 10 Edema Assessment Assessed: [Left: No] [Right: No] E[Left: dema] [Right: :] Calf Left: Right: Point of  Measurement: 33 cm From Medial Instep 50.6 cm cm Ankle Left: Right: Point of Measurement: 11 cm From Medial Instep 29 cm cm Vascular Assessment Pulses: Dorsalis Pedis Palpable: [Left:Yes] Posterior Tibial Palpable: [Left:Yes] Extremity colors, hair growth, and conditions: Extremity Color: [Left:Red] Temperature of Extremity: [Left:Warm] Capillary Refill: [Left:< 3 seconds] Toe Nail Assessment Left: Right: Thick: Yes Discolored: Yes Deformed: Yes Improper Length and Hygiene: No Electronic Signature(s) Signed: 02/12/2017 4:25:24 PM By: [name], [name], RN, [name], [name], [name] Entered By: [name], [name], RN, [name], [name] on 02/12/2017 15:55:00 [name] ([id number]) -------------------------------------------------------------------------------- Multi Wound Chart Details Patient Name: [name] Date of  Service: 02/12/2017 3:30 PM Medical Record Number: [id number] Patient Account Number: [id number] Date of Birth/Sex: May 01, 1981 (36 y.o. Male) Treating RN: [name] Primary Care Tasneem Cormier: [name] Other Clinician: Referring Doroteo Nickolson: [name] Treating Adeleine Pask/Extender: Ricard Dillon Weeks in Treatment: 10 Vital Signs Height(in): 71 Pulse(bpm): 97 Weight(lbs): 338 Blood Pressure 135/78 (mmHg): Body Mass Index(BMI): 47 Temperature(F): 98.4 Respiratory Rate 18 (breaths/min): Photos: [1:No Photos] [2:No Photos] [N/A:N/A] Wound Location: [1:Left, Lateral Lower Leg] [2:Right, Posterior Lower Leg] [N/A:N/A] Wounding Event: [1:Gradually Appeared] [2:Surgical Injury] [N/A:N/A] Primary Etiology: [1:Vasculitis] [2:Open Surgical Wound] [N/A:N/A] Date Acquired: [1:11/18/2015] [2:02/02/2017] [N/A:N/A] Weeks of Treatment: [1:10] [2:1] [N/A:N/A] Wound Status: [1:Open] [2:Open] [N/A:N/A] Measurements L x W x D 4.3x4x0.2 [2:0.8x0.8x0.3] [N/A:N/A] (cm) Area (cm) : [1:13.509] [2:0.503] [N/A:N/A] Volume (cm) : [1:2.702] [2:0.151] [N/A:N/A] % Reduction in Area: [1:-175.20%]  [2:-1.60%] [N/A:N/A] % Reduction in Volume: 31.20% [2:-52.50%] [N/A:N/A] Classification: [1:Full Thickness Without Exposed Support Structures] [2:Partial Thickness] [N/A:N/A] Periwound Skin Texture: No Abnormalities Noted [2:No Abnormalities Noted] [N/A:N/A] Periwound Skin [1:No Abnormalities Noted] [2:No Abnormalities Noted] [N/A:N/A] Moisture: Periwound Skin Color: No Abnormalities Noted [2:No Abnormalities Noted] [N/A:N/A] Tenderness on [1:No] [2:No] [N/A:N/A] Treatment Notes Electronic Signature(s) Signed: 02/12/2017 4:25:24 PM By: [name], [name], RN, [name], [name], [name] [place], [place] ([id number]) Entered By: [name], [name], RN, [name], [name] on 02/12/2017 15:59:06 [name] ([id number]) -------------------------------------------------------------------------------- [place] Details Patient Name: [name] Date of Service: 02/12/2017 3:30 PM Medical Record Number: [id number] Patient Account Number: [id number] Date of Birth/Sex: 03/09/1981 (36 y.o. Male) Treating RN: [name] Primary Care Nilesh Stegall: [name] Other Clinician: Referring Tanvir Hipple: [name] Treating Jia Mohamed/Extender: Ricard Dillon Weeks in Treatment: 10 Active Inactive ` Orientation to the Wound Care Program Nursing Diagnoses: Knowledge deficit related to the wound healing center program Goals: Patient/caregiver will verbalize understanding of the [place] Program Date Initiated: 12/11/2016 Target Resolution Date: 03/13/2017 Goal Status: Active Interventions: Provide education on orientation to the wound center Notes: ` Venous Leg Ulcer Nursing Diagnoses: Knowledge deficit related to disease process and management Potential for venous Insuffiency (use before diagnosis confirmed) Goals: Non-invasive venous studies are completed as ordered Date Initiated: 12/11/2016 Target Resolution Date: 03/13/2017 Goal Status: Active Patient will maintain optimal edema  control Date Initiated: 12/11/2016 Target Resolution Date: 03/13/2017 Goal Status: Active Patient/caregiver will verbalize understanding of disease process and disease management Date Initiated: 12/11/2016 Target Resolution Date: 03/13/2017 Goal Status: Active Verify adequate tissue perfusion prior to therapeutic compression application Date Initiated: 12/11/2016 Target Resolution Date: 03/13/2017 Goal Status: Active [place]arga, Errol ([id number]) Interventions: Assess peripheral edema status every visit. Compression as ordered Provide education on venous insufficiency Treatment Activities: Therapeutic compression applied : 12/11/2016 Notes: ` Wound/Skin Impairment Nursing Diagnoses: Impaired tissue integrity Knowledge deficit related to ulceration/compromised skin integrity Goals: Patient/caregiver will verbalize understanding of skin care regimen Date Initiated: 12/11/2016 Target Resolution Date: 03/13/2017 Goal Status: Active Ulcer/skin breakdown will have a volume reduction of 30% by week 4 Date Initiated: 12/11/2016 Target Resolution Date: 03/13/2017 Goal Status: Active Ulcer/skin breakdown will have a volume reduction of 50% by week 8 Date Initiated: 12/11/2016 Target Resolution Date: 03/13/2017 Goal Status: Active Ulcer/skin breakdown will have a volume reduction of 80% by week 12 Date Initiated: 12/11/2016 Target Resolution Date: 03/13/2017 Goal Status: Active Ulcer/skin breakdown will heal within 14 weeks Date Initiated: 12/11/2016 Target Resolution Date: 03/13/2017 Goal Status: Active Interventions: Assess patient/caregiver ability to obtain necessary supplies Assess patient/caregiver ability to perform ulcer/skin care regimen upon admission and as needed Assess ulceration(s) every visit Provide education on ulcer and  skin care Treatment Activities: Skin care regimen initiated : 12/11/2016 Topical wound management initiated : 12/11/2016 Notes: TOMMY, GOOSTREE  ([id number]) Electronic Signature(s) Signed: 02/12/2017 4:25:24 PM By: [name], [name], RN, [name], [name], [name] Entered By: [name], [name], RN, [name], [name] on 02/12/2017 15:58:52 [name] ([id number]) -------------------------------------------------------------------------------- Pain Assessment Details Patient Name: [name] Date of Service: 02/12/2017 3:30 PM Medical Record Number: [id number] Patient Account Number: [id number] Date of Birth/Sex: 02-20-1981 (36 y.o. Male) Treating RN: [name] Primary Care Ravinder Lukehart: [name] Other Clinician: Referring Chundra Sauerwein: [name] Treating Alaa Eyerman/Extender: Tito Dine in Treatment: 10 Active Problems Location of Pain Severity and Description of Pain Patient Has Paino No Site Locations With Dressing Change: No Pain Management and Medication Current Pain Management: Goals for Pain Management Topical or injectable lidocaine is offered to patient for acute pain when surgical debridement is performed. If needed, Patient is instructed to use over the counter pain medication for the following 24-48 hours after debridement. Wound care MDs do not prescribed pain medications. Patient has chronic pain or uncontrolled pain. Patient has been instructed to make an appointment with their Primary Care Physician for pain management. Electronic Signature(s) Signed: 02/12/2017 4:25:24 PM By: [name], [name], RN, [name], [name], [name] Entered By: [name], [name], RN, [name], [name] on 02/12/2017 15:45:04 [name] ([id number]) -------------------------------------------------------------------------------- Patient/Caregiver Education Details Patient Name: [name] Date of Service: 02/12/2017 3:30 PM Medical Record Number: [id number] Patient Account Number: [id number] Date of Birth/Gender: [birth year] (36 y.o. Male) Treating RN: [name] Primary Care Physician: [name] Other Clinician: Referring Physician: [name] Treating  Physician/Extender: Tito Dine in Treatment: 10 Education Assessment Education Provided To: Patient Education Topics Provided Venous: Handouts: Controlling Swelling with Multilayered Compression Wraps, Other: wound care as prescribed Methods: Demonstration Responses: State content correctly Electronic Signature(s) Signed: 02/12/2017 4:25:24 PM By: [name], [name], RN, [name], [name], [name] Entered By: [name], [name], RN, [name], [name] on 02/12/2017 16:22:14 [name] ([id number]) -------------------------------------------------------------------------------- Wound Assessment Details Patient Name: [name] Date of Service: 02/12/2017 3:30 PM Medical Record Number: [id number] Patient Account Number: [id number] Date of Birth/Sex: 1981/03/03 (36 y.o. Male) Treating RN: [name] Primary Care [name] Perkin: [name] Other Clinician: Referring Brittin Janik: [name] Treating Nora Sabey/Extender: Ricard Dillon Weeks in Treatment: 10 Wound Status Wound Number: 1 Primary Etiology: Vasculitis Wound Location: Left, Lateral Lower Leg Wound Status: Open Wounding Event: Gradually Appeared Date Acquired: 11/18/2015 Weeks Of Treatment: 10 Clustered Wound: No Photos Photo Uploaded By: [name], [name], RN, [name], [name] on 02/12/2017 16:24:38 Wound Measurements Length: (cm) 4.3 Width: (cm) 4 Depth: (cm) 0.2 Area: (cm) 13.509 Volume: (cm) 2.702 % Reduction in Area: -175.2% % Reduction in Volume: 31.2% Wound Description Full Thickness Without Exposed Classification: Support Structures Periwound Skin Texture Texture Color No Abnormalities Noted: No No Abnormalities Noted: No Moisture No Abnormalities Noted: No Treatment Notes Wound #1 (Left, Lateral Lower Leg) 1. Cleansed with: Clean wound with Normal Saline [place]way, Zoe ([id number]) 2. Anesthetic Topical Lidocaine 4% cream to wound bed prior to debridement 4. Dressing Applied: Aquacel Ag 5. Secondary Dressing Applied ABD  Pad 7. Secured with Tape 3 Layer Compression System - Left Lower Extremity Notes unna to anchor, BFD on right Electronic Signature(s) Signed: 02/12/2017 4:25:24 PM By: [name], [name], RN, [name], [name], [name] Entered By: [name], [name], RN, [name], [name] on 02/12/2017 15:53:31 [name] ([id number]) -------------------------------------------------------------------------------- Wound Assessment Details Patient Name: [name] Date of Service: 02/12/2017 3:30 PM Medical Record Number: [id number] Patient Account Number: [id number] Date of Birth/Sex: 11/19/1980 (36  y.o. Male) Treating RN: [name] Primary Care Hollyn Stucky: [name] Other Clinician: Referring Alahni Varone: [name] Treating Rahm Minix/Extender: Ricard Dillon Weeks in Treatment: 10 Wound Status Wound Number: 2 Primary Etiology: Open Surgical Wound Wound Location: Right, Posterior Lower Leg Wound Status: Open Wounding Event: Surgical Injury Date Acquired: 02/02/2017 Weeks Of Treatment: 1 Clustered Wound: No Photos Photo Uploaded By: [name], [name], RN, [name], [name] on 02/12/2017 16:24:49 Wound Measurements Length: (cm) 0.8 Width: (cm) 0.8 Depth: (cm) 0.3 Area: (cm) 0.503 Volume: (cm) 0.151 % Reduction in Area: -1.6% % Reduction in Volume: -52.5% Wound Description Classification: Partial Thickness Periwound Skin Texture Texture Color No Abnormalities Noted: No No Abnormalities Noted: No Moisture No Abnormalities Noted: No Treatment Notes Wound #2 (Right, Posterior Lower Leg) 1. Cleansed with: Clean wound with Normal Saline 2. Anesthetic Budney, [place] ([id number]) Topical Lidocaine 4% cream to wound bed prior to debridement 4. Dressing Applied: Aquacel Ag 5. Secondary Dressing Applied ABD Pad 7. Secured with Tape 3 Layer Compression System - Left Lower Extremity Notes unna to anchor, BFD on right Electronic Signature(s) Signed: 02/12/2017 4:25:24 PM By: [name], [name], RN, [name], [name], [name] Entered By:  [name], [name], RN, [name], [name] on 02/12/2017 15:53:31 [name] ([id number]) -------------------------------------------------------------------------------- [place] Details Patient Name: [name] Date of Service: 02/12/2017 3:30 PM Medical Record Number: [id number] Patient Account Number: [id number] Date of Birth/Sex: 1980/11/10 (36 y.o. Male) Treating RN: [name] Primary Care Louiza Moor: [name] Other Clinician: Referring Aarianna Hoadley: [name] Treating Nasim Habeeb/Extender: Ricard Dillon Weeks in Treatment: 10 Vital Signs Time Taken: 15:45 Temperature (F): 98.4 Height (in): 71 Pulse (bpm): 97 Weight (lbs): 338 Respiratory Rate (breaths/min): 18 Body Mass Index (BMI): 47.1 Blood Pressure (mmHg): 135/78 Reference Range: 80 - 120 mg / dl Electronic Signature(s) Signed: 02/12/2017 4:25:24 PM By: [name], [name], RN, [name], [name], [name] Entered By: [name], [name], RN, [name], [name] on 02/12/2017 15:45:35

## 2017-02-19 ENCOUNTER — Encounter: Payer: 59 | Attending: Internal Medicine | Admitting: Internal Medicine

## 2017-02-19 DIAGNOSIS — L959 Vasculitis limited to the skin, unspecified: Secondary | ICD-10-CM | POA: Diagnosis not present

## 2017-02-19 DIAGNOSIS — S81802A Unspecified open wound, left lower leg, initial encounter: Secondary | ICD-10-CM | POA: Diagnosis not present

## 2017-02-19 DIAGNOSIS — T8189XA Other complications of procedures, not elsewhere classified, initial encounter: Secondary | ICD-10-CM | POA: Diagnosis not present

## 2017-02-19 DIAGNOSIS — I872 Venous insufficiency (chronic) (peripheral): Secondary | ICD-10-CM | POA: Diagnosis not present

## 2017-02-19 DIAGNOSIS — L97223 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sis of muscle: Secondary | ICD-10-CM | POA: Insufficient documentation

## 2017-02-20 ENCOUNTER — Ambulatory Visit (INDEPENDENT_AMBULATORY_CARE_PROVIDER_SITE_OTHER): Payer: 59 | Admitting: Primary Care

## 2017-02-20 ENCOUNTER — Encounter: Payer: Self-pay | Admitting: Primary Care

## 2017-02-20 VITALS — BP 134/78 | HR 89 | Temp 98.0°F | Ht 72.0 in | Wt 347.0 lb

## 2017-02-20 DIAGNOSIS — L97222 Non-pressure chronic ulcer of left calf with fat layer exposed: Secondary | ICD-10-CM

## 2017-02-20 DIAGNOSIS — R600 Localized edema: Secondary | ICD-10-CM | POA: Insufficient documentation

## 2017-02-20 LAB — BASIC METABOLIC PANEL
BUN: 15 mg/dL (ref 6–23)
CO2: 29 mEq/L (ref 19–32)
Calcium: 9.3 mg/dL (ref 8.4–10.5)
Chloride: 102 mEq/L (ref 96–112)
Creatinine, Ser: 1.03 mg/dL (ref 0.40–1.50)
GFR: 85.87 mL/min (ref >60.00)
Glucose, Bld: 89 mg/dL (ref 70–99)
Potassium: 4.4 mEq/L (ref 3.5–5.1)
Sodium: 137 mEq/L (ref 135–145)

## 2017-02-20 LAB — BRAIN NATRIURETIC PEPTIDE: Pro B Natriuretic peptide (BNP): 9 pg/mL (ref 0.0–100.0)

## 2017-02-20 MED ORDER — FUROSEMIDE 20 MG PO TABS: 20.0000 mg | ORAL_TABLET | Freq: Every day | ORAL | 0 refills | Status: DC

## 2017-02-20 NOTE — Patient Instructions (Signed)
Complete lab work prior to leaving today. I will notify you of your results once received.   Start furosemide 20 mg tablets for leg swelling. Please call me in one week if no improvement.   It was a pleasure to see you today!

## 2017-02-20 NOTE — Assessment & Plan Note (Signed)
Following with the wound center, overall left lower extremity ulcer has improved.

## 2017-02-20 NOTE — Progress Notes (Signed)
[name], [name] ([id number]) Visit Report for 02/19/2017 Debridement Details Patient Name: [name], [name] Date of Service: 02/19/2017 9:15 AM Medical Record Number: [id number] Patient Account Number: [id number] Date of Birth/Sex: [birth year] (36 y.o. Male) Treating RN: Huel Coventry Primary Care Provider: [name] Other Clinician: Referring Provider: [name] Treating Provider/Extender: Altamese [place] in Treatment: 11 Debridement Performed for Wound #1 Left,Lateral Lower Leg Assessment: Performed By: Physician Maxwell Caul, MD Debridement: Debridement Pre-procedure Verification/Time Out Yes - 09:33 Taken: Start Time: 09:33 Pain Control: Other : lidocaine 4% Level: Skin/Subcutaneous Tissue Total Area Debrided (L x 4.2 (cm) x 3.4 (cm) = 14.28 (cm) W): Tissue and other Viable, Non-Viable, Fibrin/Slough, Subcutaneous material debrided: Instrument: Curette Bleeding: Minimum Hemostasis Achieved: Pressure End Time: 09:33 Procedural Pain: 1 Post Procedural Pain: 1 Response to Treatment: Procedure was tolerated well Post Debridement Measurements of Total Wound Length: (cm) 4.2 Width: (cm) 3.4 Depth: (cm) 0.3 Volume: (cm) 3.365 Character of Wound/Ulcer Post Stable Debridement: Post Procedure Diagnosis Same as Pre-procedure Electronic Signature(s) Signed: 02/19/2017 4:23:55 PM By: Baltazar Najjar MD Signed: 02/19/2017 4:42:06 PM By: Elliot Gurney, [name], RN, [name], [name], [name] [place], [place] ([id number]) Entered By: Baltazar Najjar on 02/19/2017 09:44:57 [name] ([id number]) -------------------------------------------------------------------------------- Debridement Details Patient Name: [name] Date of Service: 02/19/2017 9:15 AM Medical Record Number: [id number] Patient Account Number: [id number] Date of Birth/Sex: 12-11-1980 (36 y.o. Male) Treating RN: Huel Coventry Primary Care Provider: [name] Other Clinician: Referring Provider: [name] Treating Provider/Extender: Altamese [place] in Treatment: 11 Debridement Performed for Wound #2 Right,Posterior Lower Leg Assessment: Performed By: Physician Maxwell Caul, MD Debridement: Debridement Pre-procedure Verification/Time Out Yes - 09:33 Taken: Start Time: 09:33 Pain Control: Other : lidocaine 4% Level: Skin/Subcutaneous Tissue Total Area Debrided (L x 0.9 (cm) x 1.2 (cm) = 1.08 (cm) W): Tissue and other Viable, Non-Viable, Fibrin/Slough, Subcutaneous material debrided: Instrument: Curette Bleeding: Minimum Hemostasis Achieved: Pressure End Time: 09:33 Procedural Pain: 1 Post Procedural Pain: 1 Response to Treatment: Procedure was tolerated well Post Debridement Measurements of Total Wound Length: (cm) 0.9 Width: (cm) 1.2 Depth: (cm) 0.3 Volume: (cm) 0.254 Character of Wound/Ulcer Post Stable Debridement: Post Procedure Diagnosis Same as Pre-procedure Electronic Signature(s) Signed: 02/19/2017 4:23:55 PM By: Baltazar Najjar MD Signed: 02/19/2017 4:42:06 PM By: Elliot Gurney, [name], RN, [name], [name], [name] Entered By: Baltazar Najjar on 02/19/2017 09:45:12 [name] ([id number]) -------------------------------------------------------------------------------- HPI Details Patient Name: [name] Date of Service: 02/19/2017 9:15 AM Medical Record Number: [id number] Patient Account Number: [id number] Date of Birth/Sex: [birth year] (35 y.o. Male) Treating RN: Huel Coventry Primary Care Provider: [name] Other Clinician: Referring Provider: [name] Treating Provider/Extender: Maxwell Caul Weeks in Treatment: 11 History of Present Illness HPI Description: 12/04/16; 36 year old man who comes into the clinic today for review of a wound on the posterior left calf. He tells me that is been there for about a year. He is not a diabetic he does smoke half a pack per day. He was seen in the ER on 11/20/16 felt to have cellulitis  around the wound and was given clindamycin. An x-ray did not show osteomyelitis. The patient initially tells me that he has a milk allergy that sets off a pruritic itching rash on his lower legs which she scratches incessantly and he thinks that's what may have set up the wound. He has been using various topical antibiotics and ointments without any effect. He works in a [employer]ing [employer] and is on his feet all day. He does not have a  prior history of wounds however he does have the rash on both lower legs the right arm and the ventral aspect of his left arm. These are excoriations and clearly have had scratching however there are of macular looking areas on both legs including a substantial larger area on the right leg. This does not have an underlying open area. There is no blistering. 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 He seemed to recognize this. 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 He is not in any treatment for this. He's had no recent travel 12/11/16; the patient arrives today with his wound and roughly the same condition we've been using silver alginate this is a deep punched out wound with some surrounding erythema but no tenderness. Biopsy I did did not show confirmed pyoderma gangrenosum suggested nonspecific inflammation and vasculitis but does not provide an actual description of what was seen by the pathologist. I'm really not able to understand this We have also received information from the patient's dermatologist in [place] notes from April 2016. This was a doctor Agarwal-antal. 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 He was prescribed topical steroid high potency under occlusion which helped but at this point the patient did not have a  deep punched out wound. 12/18/16; the patient's wound is larger in terms of surface area however this surface looks better and there is less depth. The surrounding erythema also is better. The patient states that the wrap we put on came off 2 days ago when he has been using his compression stockings. He we are in the process of getting a dermatology consult. 12/26/16 on evaluation today patient's left lower extremity wound shows evidence of infection with surrounding erythema noted. He has been tolerating the dressing changes but states that he has noted more discomfort. There is a larger area of erythema surrounding the wound. No fevers, chills, nausea, or vomiting noted at this time. With that being said the wound still does have slough covering the surface. He is not allergic to any medication that he is aware of at this point. In regard to his right lower extremity he had several regions that are erythematous and pruritic he wonders if there's anything we can do to help that. [name], [name] ([id number]) 01/02/17 I reviewed patient's wound culture which was obtained his visit last week. He was placed on doxycycline at that point. Unfortunately that does not appear to be an antibiotic that would likely help with the situation however the pseudomonas noted on culture is sensitive to Cipro. Also unfortunately patient's wound seems to have a large compared to last week's evaluation. Not severely so but there are definitely increased measurements in general. He is continuing to have discomfort as well he writes this to be a seven out of 10. In fact he would prefer me not to perform any debridement today due to the fact that he is having discomfort and considering he has an active infection on the little reluctant to do so anyway. No fevers, chills, nausea, or vomiting noted at this time. 01/08/17; patient seems dermatology on September 5. I suspect dermatology will want the slides from the biopsy I did  sent to their pathologist. I'm not sure if there is a way we  can expedite that. In any case the culture I did before I left on vacation 3 weeks ago showed Pseudomonas he was given 10 days of Cipro and per her description of her intake nurses is actually somewhat better this week although the wound is quite a bit bigger than I remember the last time I saw this. He still has 3 more days of Cipro 01/21/17; dermatology appointment tomorrow. He has completed the ciprofloxacin for Pseudomonas. Surface of the wound looks better however he is had some deterioration in the lesions on his right leg. Meantime the left lateral leg wound we will continue with sample 01/29/17; patient had his dermatology appointment but I can't yet see that note. He is completed his antibiotics. The wound is more superficial but considerably larger in circumferential area than when he came in. This is in his left lateral calf. He also has swollen erythematous areas with superficial wounds on the right leg and small papular areas on both arms. There apparently areas in her his upper thighs and buttocks I did not look at those. Dermatology biopsied the right leg. Hopefully will have their input next week. 02/05/17; patient went back to see his dermatologist who told him that he had a "scratching problem" as well as staph. He is now on a 30 day course of doxycycline and I believe she gave him triamcinolone cream to the right leg areas to help with the itching [not exactly sure but probably triamcinolone]. She apparently looked at the left lateral leg wound although this was not rebiopsied and I think felt to be ultimately part of the same pathogenesis. He is using sample border foam and changing nevus himself. He now has a new open area on the right posterior leg which was his biopsy site I don't have any of the dermatology notes 02/12/17; we put the patient in compression last week with SANTYL to the wound on the left leg and  the biopsy. Edema is much better and the depth of the wound is now at level of skin. Area is still the same oBiopsy site on the right lateral leg we've also been using santyl with a border foam dressing and he is changing this himself. 02/19/17; U silver alginate started last week to both the substantial left leg wound and the biopsy site on the right wound. He is tolerating compression well. Has a an appointment with his primary M.D. tomorrow wondering about diuretics although I'm wondering if the edema problem is actually lymphedema Electronic Signature(s) Signed: 02/19/2017 4:23:55 PM By: Baltazar Najjar MD Entered By: Baltazar Najjar on 02/19/2017 09:46:25 [name] ([id number]) -------------------------------------------------------------------------------- Physical Exam Details Patient Name: [name] Date of Service: 02/19/2017 9:15 AM Medical Record Number: [id number] Patient Account Number: [id number] Date of Birth/Sex: 1980-10-15 (36 y.o. Male) Treating RN: Huel Coventry Primary Care Provider: [name] Other Clinician: Referring Provider: [name] Treating Provider/Extender: Maxwell Caul Weeks in Treatment: 11 Constitutional Sitting or standing Blood Pressure is within target range for patient.. Pulse regular and within target range for patient.Marland Kitchen Respirations regular, non-labored and within target range.. Temperature is normal and within the target range for the patient.Marland Kitchen appears in no distress. Cardiovascular I think he has edema with some degree of lymphedema in his bilateral lower extremities. Notes Wound exam; as much better edema control. The large wound on the left lateral leg appears to be coming down in terms of dimensions however this week he has a very adherent necrotic surface that required debridement with a #5 curet  hemostasis with direct pressure oI also debrided the biopsy site on the right leg. Hemostasis with direct pressure Electronic  Signature(s) Signed: 02/19/2017 4:23:55 PM By: Baltazar Najjar MD Entered By: Baltazar Najjar on 02/19/2017 09:49:02 [name] ([id number]) -------------------------------------------------------------------------------- Physician Orders Details Patient Name: [name] Date of Service: 02/19/2017 9:15 AM Medical Record Number: [id number] Patient Account Number: [id number] Date of Birth/Sex: 1980/08/04 (36 y.o. Male) Treating RN: Huel Coventry Primary Care Provider: [name] Other Clinician: Referring Provider: [name] Treating Provider/Extender: Altamese [place] in Treatment: 87 Verbal / Phone Orders: No Diagnosis Coding Wound Cleansing Wound #1 Left,Lateral Lower Leg o May Shower, gently pat wound dry prior to applying new dressing. o No tub bath. Wound #2 Right,Posterior Lower Leg o May Shower, gently pat wound dry prior to applying new dressing. o No tub bath. Primary Wound Dressing Wound #1 Left,Lateral Lower Leg o Aquacel Ag Wound #2 Right,Posterior Lower Leg o Aquacel Ag Secondary Dressing Wound #1 Left,Lateral Lower Leg o ABD pad Wound #2 Right,Posterior Lower Leg o Boardered Foam Dressing Dressing Change Frequency Wound #1 Left,Lateral Lower Leg o Change dressing every week Wound #2 Right,Posterior Lower Leg o Change dressing every other day. Follow-up Appointments Wound #1 Left,Lateral Lower Leg o Return Appointment in 1 week. Wound #2 Right,Posterior Lower Leg o Return Appointment in 1 week. [name], [name] ([id number]) Edema Control Wound #1 Left,Lateral Lower Leg o 3 Layer Compression System - Left Lower Extremity - unna to anchor o Elevate legs to the level of the heart and pump ankles as often as possible Additional Orders / Instructions Wound #1 Left,Lateral Lower Leg o Increase protein intake. o Activity as tolerated Medications-please add to medication list. Wound #1 Left,Lateral Lower Leg o  P.O. Antibiotics - Continnue Abx o Topical Antibiotic - TAC o Santyl Enzymatic Ointment Electronic Signature(s) Signed: 02/19/2017 4:23:55 PM By: Baltazar Najjar MD Signed: 02/19/2017 4:42:06 PM By: Elliot Gurney, [name], RN, [name], [name], [name] Entered By: Elliot Gurney, [name], RN, [name], [name] on 02/19/2017 09:34:44 [name] ([id number]) -------------------------------------------------------------------------------- Problem List Details Patient Name: [name] Date of Service: 02/19/2017 9:15 AM Medical Record Number: [id number] Patient Account Number: [id number] Date of Birth/Sex: 06-06-1980 (36 y.o. 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 Male) Treating RN: Huel Coventry Primary Care Provider: [name] Other Clinician: Referring Provider: [name] Treating Provider/Extender: Maxwell Caul Weeks in Treatment: 11 Subjective History of Present Illness (HPI) 12/04/16; 36 year old man who comes into the clinic today for review of a wound on the posterior left calf. He tells me that is been there for about a year. He is not a diabetic he does smoke half a  pack per day. He was seen in the ER on 11/20/16 felt to have cellulitis around the wound and was given clindamycin. An x-ray did not show osteomyelitis. The patient initially tells me that he has a milk allergy that sets off a pruritic itching rash on his  lower legs which she scratches incessantly and he thinks that's what may have set up the wound. He has been using various topical antibiotics and ointments without any effect. He works in a [employer]ing [employer] and is on his feet all day. He does not have a prior history of wounds however he does have the rash on both lower legs the right arm and the ventral aspect of his left arm. These are excoriations and clearly have had scratching however there are of macular looking areas on both legs including a substantial larger area on the right leg. This does not have an underlying open area. There is no blistering. 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 He seemed to recognize this. 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 He is not in any treatment for this. He's had no recent travel 12/11/16; the patient arrives today with his wound and roughly the same condition we've been using silver alginate this is a deep punched out wound with some surrounding erythema but no tenderness. Biopsy I did did not show confirmed pyoderma gangrenosum suggested nonspecific inflammation and vasculitis but does not provide an actual description of what was seen by the pathologist. I'm really not able to understand this We have also received information from the patient's dermatologist in [place] notes from April 2016. This was a doctor Agarwal-antal. 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 He was prescribed topical steroid high potency under  occlusion which helped but at this point the patient did not have a deep punched out wound. 12/18/16; the patient's wound is larger in terms of surface area however this surface looks better and there is less depth. The surrounding erythema also is better. The patient states that the wrap we put on came off 2 days ago when he has been using his compression stockings. He we are in the process of getting a dermatology consult. 12/26/16 on evaluation today patient's left lower extremity wound shows evidence of infection with surrounding erythema noted. He has been tolerating the dressing changes but states that he has noted more discomfort. There is a larger area of erythema surrounding the wound. No fevers, chills, nausea, or vomiting noted at this time. With that being said the wound still does have slough covering the surface. He is not allergic to any medication that he is aware of at this point. In regard to his right lower extremity he had several regions that are erythematous and pruritic he wonders if there's anything we can do to help that. [name], [name] ([id number]) 01/02/17 I reviewed patient's wound culture which was obtained his visit last week. He was placed on doxycycline at that point. Unfortunately that does not appear to be an antibiotic that would likely help with the situation however the pseudomonas noted on culture is sensitive to Cipro. Also unfortunately patient's wound seems to have a large compared to last week's evaluation. Not severely so but there are definitely increased measurements in general. He is continuing to have discomfort as well he writes this to be a seven out of 10. In fact he would prefer me not to perform any debridement today due to the fact that he is having discomfort and considering he has an active infection on the little reluctant  to do so anyway. No fevers, chills, nausea, or vomiting noted at this time. 01/08/17; patient seems dermatology on September 5.  I suspect dermatology will want the slides from the biopsy I did sent to their pathologist. I'm not sure if there is a way we can expedite that. In any case the culture I did before I left on vacation 3 weeks ago showed Pseudomonas he was given 10 days of Cipro and per her description of her intake nurses is actually somewhat better this week although the wound is quite a bit bigger than I remember the last time I saw this. He still has 3 more days of Cipro 01/21/17; dermatology appointment tomorrow. He has completed the ciprofloxacin for Pseudomonas. Surface of the wound looks better however he is had some deterioration in the lesions on his right leg. Meantime the left lateral leg wound we will continue with sample 01/29/17; patient had his dermatology appointment but I can't yet see that note. He is completed his antibiotics. The wound is more superficial but considerably larger in circumferential area than when he came in. This is in his left lateral calf. He also has swollen erythematous areas with superficial wounds on the right leg and small papular areas on both arms. There apparently areas in her his upper thighs and buttocks I did not look at those. Dermatology biopsied the right leg. Hopefully will have their input next week. 02/05/17; patient went back to see his dermatologist who told him that he had a "scratching problem" as well as staph. He is now on a 30 day course of doxycycline and I believe she gave him triamcinolone cream to the right leg areas to help with the itching [not exactly sure but probably triamcinolone]. She apparently looked at the left lateral leg wound although this was not rebiopsied and I think felt to be ultimately part of the same pathogenesis. He is using sample border foam and changing nevus himself. He now has a new open area on the right posterior leg which was his biopsy site I don't have any of the dermatology notes 02/12/17; we put the patient in  compression last week with SANTYL to the wound on the left leg and the biopsy. Edema is much better and the depth of the wound is now at level of skin. Area is still the same Biopsy site on the right lateral leg we've also been using santyl with a border foam dressing and he is changing this himself. 02/19/17; U silver alginate started last week to both the substantial left leg wound and the biopsy site on the right wound. He is tolerating compression well. Has a an appointment with his primary M.D. tomorrow wondering about diuretics although I'm wondering if the edema problem is actually lymphedema Objective Constitutional [name], [name] ([id number]) Sitting or standing Blood Pressure is within target range for patient.. Pulse regular and within target range for patient.Marland Kitchen Respirations regular, non-labored and within target range.. Temperature is normal and within the target range for the patient.Marland Kitchen appears in no distress. Vitals Time Taken: 9:16 AM, Height: 71 in, Weight: 338 lbs, BMI: 47.1, Temperature: 98.2 F, Pulse: 84 bpm, Respiratory Rate: 16 breaths/min, Blood Pressure: 137/73 mmHg. Cardiovascular I think he has edema with some degree of lymphedema in his bilateral lower extremities. General Notes: Wound exam; as much better edema control. The large wound on the left lateral leg appears to be coming down in terms of dimensions however this week he has a very adherent necrotic surface that  required debridement with a #5 curet hemostasis with direct pressure I also debrided the biopsy site on the right leg. Hemostasis with direct pressure Integumentary (Hair, Skin) Wound #1 status is Open. Original cause of wound was Gradually Appeared. The wound is located on the Left,Lateral Lower Leg. The wound measures 4.2cm length x 3.4cm width x 0.2cm depth; 11.215cm^2 area and 2.243cm^3 volume. There is Fat Layer (Subcutaneous Tissue) Exposed exposed. There is no tunneling or undermining noted.  There is a large amount of serosanguineous drainage noted. The wound margin is flat and intact. There is no granulation within the wound bed. There is a large (67-100%) amount of necrotic tissue within the wound bed including Adherent Slough. The periwound skin appearance exhibited: Induration, Erythema. The periwound skin appearance did not exhibit: Callus, Crepitus, Excoriation, Rash, Scarring, Dry/Scaly, Maceration, Atrophie Blanche, Cyanosis, Ecchymosis, Hemosiderin Staining, Mottled, Pallor, Rubor. The surrounding wound skin color is noted with erythema which is circumferential. Wound #2 status is Open. Original cause of wound was Surgical Injury. The wound is located on the Right,Posterior Lower Leg. The wound measures 0.9cm length x 1.2cm width x 0.2cm depth; 0.848cm^2 area and 0.17cm^3 volume. There is Fat Layer (Subcutaneous Tissue) Exposed exposed. There is no tunneling or undermining noted. There is a medium amount of serous drainage noted. The wound margin is flat and intact. There is no granulation within the wound bed. There is a large (67-100%) amount of necrotic tissue within the wound bed including Adherent Slough. The periwound skin appearance exhibited: Erythema. The periwound skin appearance did not exhibit: Callus, Crepitus, Excoriation, Induration, Rash, Scarring, Dry/Scaly, Maceration, Atrophie Blanche, Cyanosis, Ecchymosis, Hemosiderin Staining, Mottled, Pallor, Rubor. The surrounding wound skin color is noted with erythema which is circumferential. 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 There was a Skin/Subcutaneous Tissue Debridement ([id number]) debridement with total area of 14.28 sq cm performed by Maxwell Caul, MD. with the following instrument(s): Curette to remove Viable and Non-Viable tissue/material including Fibrin/Slough and Subcutaneous after achieving pain control using Other (lidocaine 4%). A time out was conducted at 09:33, prior to the start of the procedure. A Minimum amount of bleeding was controlled with Pressure. The procedure was tolerated well with a pain level of 1 throughout and a pain level of 1 following the procedure. Post Debridement Measurements: 4.2cm length x 3.4cm width x 0.3cm depth; 3.365cm^3 volume. Character of Wound/Ulcer Post Debridement is stable. Post procedure Diagnosis Wound #1: Same as Pre-Procedure Wound #2 Pre-procedure diagnosis of Wound #2 is an Open Surgical Wound located on the Right,Posterior Lower Leg . There was a Skin/Subcutaneous Tissue Debridement ([id number]) debridement with total area of 1.08 sq cm performed by Maxwell Caul, MD. with the following instrument(s): Curette to remove Viable and Non-Viable tissue/material including Fibrin/Slough and Subcutaneous after achieving pain control using Other (lidocaine 4%). A time out was conducted at 09:33, prior to the start of the procedure. A Minimum amount of bleeding was controlled with Pressure. The procedure was tolerated well with a pain level of 1 throughout and a pain level of 1 following the procedure. Post Debridement Measurements: 0.9cm length x 1.2cm width x 0.3cm depth; 0.254cm^3 volume. Character of Wound/Ulcer Post Debridement is stable. Post procedure Diagnosis Wound #2: Same as Pre-Procedure Plan Wound Cleansing: Wound #1 Left,Lateral Lower Leg: May  Shower, gently pat wound dry prior to applying new dressing. No tub bath. Wound #2 Right,Posterior Lower Leg: May Shower, gently pat wound dry prior to applying new dressing. No tub bath. Primary Wound Dressing: Wound #1 Left,Lateral Lower Leg: Aquacel Ag Wound #2 Right,Posterior Lower Leg: Aquacel Ag Secondary  Dressing: POPE, BRUNTY ([id number]) Wound #1 Left,Lateral Lower Leg: ABD pad Wound #2 Right,Posterior Lower Leg: Boardered Foam Dressing Dressing Change Frequency: Wound #1 Left,Lateral Lower Leg: Change dressing every week Wound #2 Right,Posterior Lower Leg: Change dressing every other day. Follow-up Appointments: Wound #1 Left,Lateral Lower Leg: Return Appointment in 1 week. Wound #2 Right,Posterior Lower Leg: Return Appointment in 1 week. Edema Control: Wound #1 Left,Lateral Lower Leg: 3 Layer Compression System - Left Lower Extremity - unna to anchor Elevate legs to the level of the heart and pump ankles as often as possible Additional Orders / Instructions: Wound #1 Left,Lateral Lower Leg: Increase protein intake. Activity as tolerated Medications-please add to medication list.: Wound #1 Left,Lateral Lower Leg: P.O. Antibiotics - Continnue Abx Topical Antibiotic - TAC Santyl Enzymatic Ointment #1 continue with Aquacel Ag to both wound areas which appear to be smaller. #2 both wounds required debridement today if this continues and sitter going back to santyl or iodoflex #3 were using 3 layer compression on the left lower extremity and border foam on the right #4 given his history of increasing edema that is nonpitting I think this is lymphedema however in an early stage Electronic Signature(s) Signed: 02/19/2017 4:23:55 PM By: Baltazar Najjar MD Entered By: Baltazar Najjar on 02/19/2017 09:50:35 [name] ([id number]) -------------------------------------------------------------------------------- SuperBill Details Patient Name: [name] Date of Service: 02/19/2017 Medical Record Number: [id number] Patient Account Number: [id number] Date of Birth/Sex: Aug 30, 1980 (36 y.o. Male) Treating RN: Huel Coventry Primary Care Provider: [name] Other Clinician: Referring Provider: [name] Treating Provider/Extender: Maxwell Caul Weeks in Treatment:  11 Diagnosis Coding ICD-10 Codes Code Description [phone] 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sis of muscle I87.2 Venous insufficiency (chronic) (peripheral) Facility Procedures CPT4 Code: [id number] Description: 11042 - DEB SUBQ TISSUE 20 SQ CM/< ICD-10 Description Diagnosis L97.223 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 I87.2 Venous insufficiency (chronic) (peripheral) Modifier: sis of muscle Quantity: 1 Physician Procedures CPT4 Code: [id number] Description: 11042 - WC PHYS SUBQ TISS 20 SQ CM ICD-10 Description Diagnosis L97.223 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 I87.2 Venous insufficiency (chronic) (peripheral) Modifier: sis of muscle Quantity: 1 Electronic Signature(s) Signed: 02/19/2017 4:23:55 PM By: Baltazar Najjar MD Entered By: Baltazar Najjar on 02/19/2017 09:51:46

## 2017-02-20 NOTE — Assessment & Plan Note (Signed)
Chronic for years.  Suspect this is secondary more to vascular cause rather than cardiac. Check BMP and BNP today.  Rx for furosemide 20 mg sent to pharmacy. This may not help much if the cause is vascular.  He will call in 1 week if no improvement.

## 2017-02-20 NOTE — Progress Notes (Signed)
MARKS, SCALERA ([id number]) Visit Report for 02/19/2017 Arrival Information Details Patient Name: [name], [name] Date of Service: 02/19/2017 9:15 AM Medical Record Number: [id number] Patient Account Number: [id number] Date of Birth/Sex: [birth year] (36 y.o. Male) Treating RN: [name] Primary Care Toluwani Yadav: [name] Other Clinician: Referring Apoorva Bugay: [name] Treating Adysson Revelle/Extender: Tito Dine in Treatment: 11 Visit Information History Since Last Visit Added or deleted any medications: No Patient Arrived: Ambulatory Any new allergies or adverse reactions: No Arrival Time: 09:09 Had a fall or experienced change in No Accompanied By: self activities of daily living that may affect Transfer Assistance: None risk of falls: Patient Identification Verified: Yes Signs or symptoms of abuse/neglect since last No Secondary Verification Process Yes visito Completed: Hospitalized since last visit: No Patient Requires Transmission-Based No Has Dressing in Place as Prescribed: Yes Precautions: Has Compression in Place as Prescribed: Yes Patient Has Alerts: No Pain Present Now: No Electronic Signature(s) Signed: 02/19/2017 4:42:06 PM By: [name], [name], RN, [name], [name], [name] Entered By: [name], [name], RN, [name], [name] on 02/19/2017 09:16:15 [name] ([id number]) -------------------------------------------------------------------------------- Encounter Discharge Information Details Patient Name: [name] Date of Service: 02/19/2017 9:15 AM Medical Record Number: [id number] Patient Account Number: [id number] Date of Birth/Sex: [birth year] (36 y.o. Male) Treating RN: [name] Primary Care Hendel Gatliff: [name] Other Clinician: Referring Rohil Lesch: [name] Treating Mariette Cowley/Extender: Tito Dine in Treatment: 11 Encounter Discharge Information Items Discharge Pain Level: 0 Discharge Condition: Stable Ambulatory Status:  Ambulatory Discharge Destination: Home Transportation: Private Auto Accompanied By: self Schedule Follow-up Appointment: Yes Medication Reconciliation completed Yes and provided to Patient/Care Taylia Berber: Patient Clinical Summary of Care: Declined Electronic Signature(s) Signed: 02/19/2017 4:17:02 PM By: Ruthine Dose Entered By: Ruthine Dose on 02/19/2017 09:49:14 [name], [name] ([id number]) -------------------------------------------------------------------------------- Lower Extremity Assessment Details Patient Name: [name] Date of Service: 02/19/2017 9:15 AM Medical Record Number: [id number] Patient Account Number: [id number] Date of Birth/Sex: 01/30/1981 (36 y.o. Male) Treating RN: [name] Primary Care Gloriajean Okun: [name] Other Clinician: Referring Imanie Darrow: [name] Treating Melynda Krzywicki/Extender: Ricard Dillon Weeks in Treatment: 11 Edema Assessment Assessed: [Left: No] [Right: No] E[Left: dema] [Right: :] Calf Left: Right: Point of Measurement: 33 cm From Medial Instep 51 cm cm Ankle Left: Right: Point of Measurement: 11 cm From Medial Instep 30 cm cm Vascular Assessment Pulses: Dorsalis Pedis Palpable: [Left:Yes] [Right:Yes] Posterior Tibial Extremity colors, hair growth, and conditions: Extremity Color: [Left:Mottled] [Right:Mottled] Hair Growth on Extremity: [Left:Yes] [Right:Yes] Temperature of Extremity: [Left:Warm] [Right:Warm] Capillary Refill: [Left:< 3 seconds] [Right:< 3 seconds] Dependent Rubor: [Left:No] [Right:No] Blanched when Elevated: [Left:No] [Right:No] Lipodermatosclerosis: [Left:No] [Right:No] Toe Nail Assessment Left: Right: Thick: No No Discolored: No No Deformed: No No Improper Length and Hygiene: Yes Yes Electronic Signature(s) Signed: 02/19/2017 4:42:06 PM By: [name], [name], RN, [name], [name], [name] [name], [name] ([id number]) Entered By: [name], [name], RN, [name], [name] on 02/19/2017 09:28:23 [name]  ([id number]) -------------------------------------------------------------------------------- Multi Wound Chart Details Patient Name: [name] Date of Service: 02/19/2017 9:15 AM Medical Record Number: [id number] Patient Account Number: [id number] Date of Birth/Sex: 1980-11-16 (36 y.o. Male) Treating RN: [name] Primary Care Garnell Begeman: [name] Other Clinician: Referring Aleiyah Halpin: [name] Treating Chaise Passarella/Extender: Ricard Dillon Weeks in Treatment: 11 Vital Signs Height(in): 71 Pulse(bpm): 84 Weight(lbs): 338 Blood Pressure 137/73 (mmHg): Body Mass Index(BMI): 47 Temperature(F): 98.2 Respiratory Rate 16 (breaths/min): Photos: [N/A:N/A] Wound Location: Left Lower Leg - Lateral Right Lower Leg - N/A Posterior Wounding Event: Gradually Appeared Surgical Injury N/A Primary Etiology: Vasculitis Open Surgical Wound  N/A Comorbid History: Sleep Apnea, Sleep Apnea, N/A Hypertension, Colitis Hypertension, Colitis Date Acquired: 11/18/2015 02/02/2017 N/A Weeks of Treatment: 11 2 N/A Wound Status: Open Open N/A Measurements L x W x D 4.2x3.4x0.2 0.9x1.2x0.2 N/A (cm) Area (cm) : 11.215 0.848 N/A Volume (cm) : 2.243 0.17 N/A % Reduction in Area: -128.50% -71.30% N/A % Reduction in Volume: 42.90% -71.70% N/A Classification: Full Thickness Without Partial Thickness N/A Exposed Support Structures Exudate Amount: Large Medium N/A Exudate Type: Serosanguineous Serous N/A Exudate Color: red, brown amber N/A Wound Margin: Flat and Intact Flat and Intact N/A Granulation Amount: None Present (0%) None Present (0%) N/A Necrotic Amount: Large (67-100%) Large (67-100%) N/A [name], [name] ([id number]) Exposed Structures: Fat Layer (Subcutaneous Fat Layer (Subcutaneous N/A Tissue) Exposed: Yes Tissue) Exposed: Yes Fascia: No Fascia: No Tendon: No Tendon: No Muscle: No Muscle: No Joint: No Joint: No Bone: No Bone: No Epithelialization: None None  N/A Debridement: Debridement (84132- Debridement (44010- N/A 11047) 11047) Pre-procedure 09:33 09:33 N/A Verification/Time Out Taken: Pain Control: Other Other N/A Tissue Debrided: Fibrin/Slough, Fibrin/Slough, N/A Subcutaneous Subcutaneous Level: Skin/Subcutaneous Skin/Subcutaneous N/A Tissue Tissue Debridement Area (sq 14.28 1.08 N/A cm): Instrument: Curette Curette N/A Bleeding: Minimum Minimum N/A Hemostasis Achieved: Pressure Pressure N/A Procedural Pain: 1 1 N/A Post Procedural Pain: 1 1 N/A Debridement Treatment Procedure was tolerated Procedure was tolerated N/A Response: well well Post Debridement 4.2x3.4x0.3 0.9x1.2x0.3 N/A Measurements L x W x D (cm) Post Debridement 3.365 0.254 N/A Volume: (cm) Periwound Skin Texture: Induration: Yes Excoriation: No N/A Excoriation: No Induration: No Callus: No Callus: No Crepitus: No Crepitus: No Rash: No Rash: No Scarring: No Scarring: No Periwound Skin Maceration: No Maceration: No N/A Moisture: Dry/Scaly: No Dry/Scaly: No Periwound Skin Color: Erythema: Yes Erythema: Yes N/A Atrophie Blanche: No Atrophie Blanche: No Cyanosis: No Cyanosis: No Ecchymosis: No Ecchymosis: No Hemosiderin Staining: No Hemosiderin Staining: No Mottled: No Mottled: No Pallor: No Pallor: No Rubor: No Rubor: No Erythema Location: Circumferential Circumferential N/A No No N/A [name], [name] ([id number]) Tenderness on Palpation: Wound Preparation: Ulcer Cleansing: Ulcer Cleansing: N/A Rinsed/Irrigated with Rinsed/Irrigated with Saline Saline Topical Anesthetic Topical Anesthetic Applied: Other: lidociane Applied: Other: lidocaine 4% 4% Procedures Performed: Debridement Debridement N/A Treatment Notes Electronic Signature(s) Signed: 02/19/2017 4:23:55 PM By: Linton Ham MD Entered By: Linton Ham on 02/19/2017 09:44:42 [name], [name]  ([id number]) -------------------------------------------------------------------------------- Multi-Disciplinary Care Plan Details Patient Name: [name] Date of Service: 02/19/2017 9:15 AM Medical Record Number: [id number] Patient Account Number: [id number] Date of Birth/Sex: [birth year] (36 y.o. Male) Treating RN: [name] Primary Care Jevin Camino: [name] Other Clinician: Referring Kenly Henckel: [name] Treating Odie Edmonds/Extender: Tito Dine in Treatment: 11 Active Inactive ` Orientation to the Wound Care Program Nursing Diagnoses: Knowledge deficit related to the wound healing center program Goals: Patient/caregiver will verbalize understanding of the [place] Program Date Initiated: 12/11/2016 Target Resolution Date: 03/13/2017 Goal Status: Active Interventions: Provide education on orientation to the wound center Notes: ` Venous Leg Ulcer Nursing Diagnoses: Knowledge deficit related to disease process and management Potential for venous Insuffiency (use before diagnosis confirmed) Goals: Non-invasive venous studies are completed as ordered Date Initiated: 12/11/2016 Target Resolution Date: 03/13/2017 Goal Status: Active Patient will maintain optimal edema control Date Initiated: 12/11/2016 Target Resolution Date: 03/13/2017 Goal Status: Active Patient/caregiver will verbalize understanding of disease process and disease management Date Initiated: 12/11/2016 Target Resolution Date: 03/13/2017 Goal Status: Active Verify adequate tissue perfusion prior to therapeutic compression application Date Initiated: 12/11/2016 Target Resolution Date: 03/13/2017 Goal Status: Active  [name], [name] ([id number]) Interventions: Assess peripheral edema status every visit. Compression as ordered Provide education on venous insufficiency Treatment Activities: Therapeutic compression applied : 12/11/2016 Notes: ` Wound/Skin Impairment Nursing  Diagnoses: Impaired tissue integrity Knowledge deficit related to ulceration/compromised skin integrity Goals: Patient/caregiver will verbalize understanding of skin care regimen Date Initiated: 12/11/2016 Target Resolution Date: 03/13/2017 Goal Status: Active Ulcer/skin breakdown will have a volume reduction of 30% by week 4 Date Initiated: 12/11/2016 Target Resolution Date: 03/13/2017 Goal Status: Active Ulcer/skin breakdown will have a volume reduction of 50% by week 8 Date Initiated: 12/11/2016 Target Resolution Date: 03/13/2017 Goal Status: Active Ulcer/skin breakdown will have a volume reduction of 80% by week 12 Date Initiated: 12/11/2016 Target Resolution Date: 03/13/2017 Goal Status: Active Ulcer/skin breakdown will heal within 14 weeks Date Initiated: 12/11/2016 Target Resolution Date: 03/13/2017 Goal Status: Active Interventions: Assess patient/caregiver ability to obtain necessary supplies Assess patient/caregiver ability to perform ulcer/skin care regimen upon admission and as needed Assess ulceration(s) every visit Provide education on ulcer and skin care Treatment Activities: Skin care regimen initiated : 12/11/2016 Topical wound management initiated : 12/11/2016 Notes: [name], [name] ([id number]) Electronic Signature(s) Signed: 02/19/2017 4:42:06 PM By: [name], [name], RN, [name], [name], [name] Entered By: [name], [name], RN, [name], [name] on 02/19/2017 09:28:37 [name] ([id number]) -------------------------------------------------------------------------------- Pain Assessment Details Patient Name: [name] Date of Service: 02/19/2017 9:15 AM Medical Record Number: [id number] Patient Account Number: [id number] Date of Birth/Sex: [birth year] (36 y.o. Male) Treating RN: [name] Primary Care Havah Ammon: [name] Other Clinician: Referring Maleiya Pergola: [name] Treating Roslin Norwood/Extender: Ricard Dillon Weeks in Treatment: 11 Active Problems Location of Pain  Severity and Description of Pain Patient Has Paino No Site Locations With Dressing Change: No Pain Management and Medication Current Pain Management: Goals for Pain Management Topical or injectable lidocaine is offered to patient for acute pain when surgical debridement is performed. If needed, Patient is instructed to use over the counter pain medication for the following 24-48 hours after debridement. Wound care MDs do not prescribed pain medications. Patient has chronic pain or uncontrolled pain. Patient has been instructed to make an appointment with their Primary Care Physician for pain management. Electronic Signature(s) Signed: 02/19/2017 4:42:06 PM By: [name], [name], RN, [name], [name], [name] Entered By: [name], [name], RN, [name], [name] on 02/19/2017 09:16:24 [name] ([id number]) -------------------------------------------------------------------------------- Patient/Caregiver Education Details Patient Name: [name] Date of Service: 02/19/2017 9:15 AM Medical Record Number: [id number] Patient Account Number: [id number] Date of Birth/Gender: 07/22/1980 (36 y.o. Male) Treating RN: [name] Primary Care Physician: [name] Other Clinician: Referring Physician: [name] Treating Physician/Extender: Tito Dine in Treatment: 11 Education Assessment Education Provided To: Patient Education Topics Provided Wound/Skin Impairment: Handouts: Caring for Your Ulcer, Other: wound care as prescribed Methods: Demonstration, Explain/Verbal Responses: State content correctly Electronic Signature(s) Signed: 02/19/2017 4:42:06 PM By: [name], [name], RN, [name], [name], [name] Entered By: [name], [name], RN, [name], [name] on 02/19/2017 09:48:00 [name] ([id number]) -------------------------------------------------------------------------------- Wound Assessment Details Patient Name: [name] Date of Service: 02/19/2017 9:15 AM Medical Record Number: [id number] Patient  Account Number: [id number] Date of Birth/Sex: [birth year] (36 y.o. Male) Treating RN: [name] Primary Care Marely Apgar: [name] Other Clinician: Referring Oluwaseun Cremer: [name] Treating Deneene Tarver/Extender: Ricard Dillon Weeks in Treatment: 11 Wound Status Wound Number: 1 Primary Etiology: Vasculitis Wound Location: Left Lower Leg - Lateral Wound Status: Open Wounding Event: Gradually Appeared Comorbid Sleep Apnea, Hypertension, History: Colitis Date Acquired: 11/18/2015 Weeks Of Treatment: 11 Clustered Wound: No Photos  Wound Measurements Length: (cm) 4.2 Width: (cm) 3.4 Depth: (cm) 0.2 Area: (cm) 11.215 Volume: (cm) 2.243 % Reduction in Area: -128.5% % Reduction in Volume: 42.9% Epithelialization: None Tunneling: No Undermining: No Wound Description Full Thickness Without Exposed Classification: Support Structures Wound Margin: Flat and Intact Exudate Large Amount: Exudate Type: Serosanguineous Exudate Color: red, brown Foul Odor After Cleansing: No Slough/Fibrino Yes Wound Bed Granulation Amount: None Present (0%) Exposed Structure Necrotic Amount: Large (67-100%) Fascia Exposed: No Necrotic Quality: Adherent Slough Fat Layer (Subcutaneous Tissue) Exposed: Yes Tendon Exposed: No Muscle Exposed: No Joint Exposed: No [name], [name] ([id number]) Bone Exposed: No Periwound Skin Texture Texture Color No Abnormalities Noted: No No Abnormalities Noted: No Callus: No Atrophie Blanche: No Crepitus: No Cyanosis: No Excoriation: No Ecchymosis: No Induration: Yes Erythema: Yes Rash: No Erythema Location: Circumferential Scarring: No Hemosiderin Staining: No Mottled: No Moisture Pallor: No No Abnormalities Noted: No Rubor: No Dry / Scaly: No Maceration: No Wound Preparation Ulcer Cleansing: Rinsed/Irrigated with Saline Topical Anesthetic Applied: Other: lidociane 4%, Treatment Notes Wound #1 (Left, Lateral Lower Leg) 1. Cleansed  with: Clean wound with Normal Saline 2. Anesthetic Topical Lidocaine 4% cream to wound bed prior to debridement 4. Dressing Applied: Other dressing (specify in notes) 5. Secondary Dressing Applied ABD Pad 7. Secured with Tape 3 Layer Compression System - Left Lower Extremity Notes SIlvercell, unna to anchor, BFD on right Electronic Signature(s) Signed: 02/19/2017 4:42:06 PM By: [name], [name], RN, [name], [name], [name] Entered By: [name], [name], RN, [name], [name] on 02/19/2017 09:25:01 [name] ([id number]) -------------------------------------------------------------------------------- Wound Assessment Details Patient Name: [name] Date of Service: 02/19/2017 9:15 AM Medical Record Number: [id number] Patient Account Number: [id number] Date of Birth/Sex: Nov 10, 1980 (36 y.o. Male) Treating RN: [name] Primary Care Lucienne Sawyers: [name] Other Clinician: Referring Blessing Zaucha: [name] Treating Tristyn Demarest/Extender: Ricard Dillon Weeks in Treatment: 11 Wound Status Wound Number: 2 Primary Etiology: Open Surgical Wound Wound Location: Right Lower Leg - Posterior Wound Status: Open Wounding Event: Surgical Injury Comorbid Sleep Apnea, Hypertension, History: Colitis Date Acquired: 02/02/2017 Weeks Of Treatment: 2 Clustered Wound: No Photos Wound Measurements Length: (cm) 0.9 Width: (cm) 1.2 Depth: (cm) 0.2 Area: (cm) 0.848 Volume: (cm) 0.17 % Reduction in Area: -71.3% % Reduction in Volume: -71.7% Epithelialization: None Tunneling: No Undermining: No Wound Description Classification: Partial Thickness Wound Margin: Flat and Intact Exudate Amount: Medium Exudate Type: Serous Exudate Color: amber Foul Odor After Cleansing: No Slough/Fibrino Yes Wound Bed Granulation Amount: None Present (0%) Exposed Structure Necrotic Amount: Large (67-100%) Fascia Exposed: No Necrotic Quality: Adherent Slough Fat Layer (Subcutaneous Tissue) Exposed: Yes Tendon Exposed:  No Muscle Exposed: No Joint Exposed: No Bone Exposed: No [name], [name] ([id number]) Periwound Skin Texture Texture Color No Abnormalities Noted: No No Abnormalities Noted: No Callus: No Atrophie Blanche: No Crepitus: No Cyanosis: No Excoriation: No Ecchymosis: No Induration: No Erythema: Yes Rash: No Erythema Location: Circumferential Scarring: No Hemosiderin Staining: No Mottled: No Moisture Pallor: No No Abnormalities Noted: No Rubor: No Dry / Scaly: No Maceration: No Wound Preparation Ulcer Cleansing: Rinsed/Irrigated with Saline Topical Anesthetic Applied: Other: lidocaine 4%, Treatment Notes Wound #2 (Right, Posterior Lower Leg) 1. Cleansed with: Clean wound with Normal Saline 2. Anesthetic Topical Lidocaine 4% cream to wound bed prior to debridement 4. Dressing Applied: Other dressing (specify in notes) 5. Secondary Dressing Applied ABD Pad 7. Secured with Tape 3 Layer Compression System - Left Lower Extremity Notes SIlvercell, unna to anchor, BFD on right Electronic Signature(s) Signed: 02/19/2017 4:42:06 PM By: [name],  [name], RN, [name], [name], [name] Entered By: [name], [name], RN, [name], [name] on 02/19/2017 09:26:02 [name] ([id number]) -------------------------------------------------------------------------------- [place] Details Patient Name: [name] Date of Service: 02/19/2017 9:15 AM Medical Record Number: [id number] Patient Account Number: [id number] Date of Birth/Sex: November 29, 1980 (36 y.o. Male) Treating RN: [name] Primary Care Eliana Lueth: [name] Other Clinician: Referring Asheton Scheffler: [name] Treating Leita Lindbloom/Extender: Tito Dine in Treatment: 11 Vital Signs Time Taken: 09:16 Temperature (F): 98.2 Height (in): 71 Pulse (bpm): 84 Weight (lbs): 338 Respiratory Rate (breaths/min): 16 Body Mass Index (BMI): 47.1 Blood Pressure (mmHg): 137/73 Reference Range: 80 - 120 mg / dl Electronic Signature(s) Signed:  02/19/2017 4:42:06 PM By: [name], [name], RN, [name], [name], [name] Entered By: [name], [name], RN, [name], [name] on 02/19/2017 09:16:50

## 2017-02-20 NOTE — Progress Notes (Signed)
Subjective:    Patient ID: [name], male    DOB: January 02, 1981, 36 y.o.   MRN: [id number]  HPI  Mr. [name] is a 36 year old male with a history of lower extremity skin ulcer, morbid obesity who presents today with a chief complaint of lower extremity edema. His lower extremity edema has been present for years. He's currently following with the wound center who recommends diuretics to help reduce swelling to help with wound healing given reduction of elasticity. He was told that they may send him for vacscular evaluation. He was once on diuretics in the past but cannot remember, he thinks this was helpful.  He denies chest pain, cough, shortness of breath. He has worn compression stockings in the past with some improvement but this causes a lot of itching. He wears diabetic socks during the day and will notice moderate indentions in his skin after removing the socks.   Review of Systems  Respiratory: Negative for shortness of breath.   Cardiovascular: Positive for leg swelling. Negative for chest pain and palpitations.  Skin:       No new skin ulcers.        Past Medical History:  Diagnosis Date  . Blood in stool   . Depression   . Elevated blood pressure   . Hyperlipidemia   . Seasonal allergies      Social History   Social History  . Marital status: Married    Spouse name: N/A  . Number of children: N/A  . Years of education: N/A   Occupational History  . Not on file.   Social History Main Topics  . Smoking status: Former Games developer  . Smokeless tobacco: Former Neurosurgeon    Quit date: 03/17/2015     Comment: Quit in [place] smoker for 9 years  . Alcohol use 0.0 oz/week     Comment: soical  . Drug use: Unknown  . Sexual activity: Not on file   Other Topics Concern  . Not on file   Social History Narrative   Married.   7 kids.   Works as a Magazine features editor at [employer].   Enjoys target shooting.     Past Surgical History:  Procedure Laterality Date  . SEPTOPLASTY   2007  . WRIST SURGERY      Family History  Problem Relation Age of Onset  . Alcohol abuse Paternal Aunt   . Alcohol abuse Paternal Uncle   . Stroke Paternal Uncle   . Hyperlipidemia Maternal Grandfather   . Hypertension Maternal Grandfather   . Diabetes Maternal Grandfather   . Alcohol abuse Maternal Grandfather   . Multiple sclerosis Mother   . Dementia Mother     Allergies  Allergen Reactions  . Biaxin [Clarithromycin] Other (See Comments)    Causes colitis flares  . Milk-Related Compounds Hives    Current Outpatient Prescriptions on File Prior to Visit  Medication Sig Dispense Refill  . acetaminophen (TYLENOL) 500 MG tablet Take 1,000 mg by mouth every 6 (six) hours as needed for headache (pain).    . cetirizine (ZYRTEC) 10 MG tablet Take 1 tablet (10 mg total) by mouth daily. 90 tablet 1  . ibuprofen (ADVIL,MOTRIN) 200 MG tablet Take 400-600 mg by mouth every 6 (six) hours as needed for headache (pain).    . ranitidine (ZANTAC) 150 MG tablet Take 150 mg by mouth 2 (two) times daily as needed for heartburn (acid reflux).     . venlafaxine XR (EFFEXOR-XR) 150 MG 24  hr capsule Take 1 capsule (150 mg total) by mouth daily with breakfast. 90 capsule 3   No current facility-administered medications on file prior to visit.     BP 134/78 (BP Location: Right Arm, Patient Position: Sitting)   Pulse 89   Temp 98 F (36.7 C) (Oral)   Ht 6' (1.829 m)   Wt (!) 347 lb (157.4 kg)   SpO2 97%   BMI 47.06 kg/m    Objective:   Physical Exam  Constitutional: He appears well-nourished.  Cardiovascular: Normal rate and regular rhythm.   No murmur heard. Pulmonary/Chest: Effort normal and breath sounds normal. He has no rales.  Skin:  Left lower extremity wrapped with dressing. Bandage to right calf from biopsy. Several smaller raised brownish spots.          Assessment & Plan:

## 2017-02-23 ENCOUNTER — Encounter: Payer: Self-pay | Admitting: Primary Care

## 2017-02-26 ENCOUNTER — Encounter: Payer: 59 | Admitting: Internal Medicine

## 2017-02-26 DIAGNOSIS — S81801A Unspecified open wound, right lower leg, initial encounter: Secondary | ICD-10-CM | POA: Diagnosis not present

## 2017-02-26 DIAGNOSIS — L97223 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sis of muscle: Secondary | ICD-10-CM | POA: Diagnosis not present

## 2017-02-26 DIAGNOSIS — R600 Localized edema: Secondary | ICD-10-CM | POA: Diagnosis not present

## 2017-02-26 DIAGNOSIS — S81802A Unspecified open wound, left lower leg, initial encounter: Secondary | ICD-10-CM | POA: Diagnosis not present

## 2017-03-02 NOTE — Progress Notes (Signed)
EMMERICH, CRYER ([id number]) Visit Report for 02/26/2017 Arrival Information Details Patient Name: [name] Date of Service: 02/26/2017 9:15 AM Medical Record Number: [id number] Patient Account Number: [id number] Date of Birth/Sex: Jan 21, 1981 (36 y.o. Male) Treating RN: [name] Primary Care Gerell Fortson: [name] Other Clinician: Referring Drenda Sobecki: [name] Treating Yukie Bergeron/Extender: Tito Dine in Treatment: 12 Visit Information History Since Last Visit Added or deleted any medications: Yes Patient Arrived: Ambulatory Any new allergies or adverse reactions: No Arrival Time: 09:24 Had a fall or experienced change in No Accompanied By: self activities of daily living that may affect Transfer Assistance: None risk of falls: Patient Identification Verified: Yes Signs or symptoms of abuse/neglect since last No Secondary Verification Process Yes visito Completed: Hospitalized since last visit: No Patient Requires Transmission-Based No Has Dressing in Place as Prescribed: Yes Precautions: Has Compression in Place as Prescribed: Yes Patient Has Alerts: No Pain Present Now: Yes Electronic Signature(s) Signed: 02/28/2017 4:29:15 PM By: [name], [name], RN, [name], [name], [name] Entered By: [name], [name], RN, [name], [name] on 02/26/2017 09:27:20 [name] ([id number]) -------------------------------------------------------------------------------- Compression Therapy Details Patient Name: [name] Date of Service: 02/26/2017 9:15 AM Medical Record Number: [id number] Patient Account Number: [id number] Date of Birth/Sex: 1980/09/25 (36 y.o. Male) Treating RN: [name] Primary Care Otillia Cordone: [name] Other Clinician: Referring Rashan Patient: [name] Treating Raygen Linquist/Extender: Ricard Dillon Weeks in Treatment: 12 Compression Therapy Performed for Wound Wound #1 Left,Lateral Lower Leg Assessment: Performed By: Clinician [name],  RN Compression Type: Three Layer Pre Treatment ABI: 1.2 Post Procedure Diagnosis Same as Pre-procedure Electronic Signature(s) Signed: 02/28/2017 4:29:15 PM By: [name], [name], RN, [name], [name], [name] Entered By: [name], [name], RN, [name], [name] on 02/26/2017 10:21:38 [name] ([id number]) -------------------------------------------------------------------------------- Encounter Discharge Information Details Patient Name: [name] Date of Service: 02/26/2017 9:15 AM Medical Record Number: [id number] Patient Account Number: [id number] Date of Birth/Sex: 02-Oct-1980 (36 y.o. Male) Treating RN: [name] Primary Care Harmoney Sienkiewicz: [name] Other Clinician: Referring Steward Sames: [name] Treating Aluel Schwarz/Extender: Tito Dine in Treatment: 12 Encounter Discharge Information Items Discharge Pain Level: 0 Discharge Condition: Stable Ambulatory Status: Ambulatory Discharge Destination: Home Transportation: Private Auto Accompanied By: self Schedule Follow-up Appointment: Yes Medication Reconciliation completed Yes and provided to Patient/Care Cael Worth: Patient Clinical Summary of Care: Declined Electronic Signature(s) Signed: 02/26/2017 12:42:30 PM By: [name], [name], RN, [name], [name], [name] Entered By: [name], [name], RN, [name], [name] on 02/26/2017 12:42:30 [name] ([id number]) -------------------------------------------------------------------------------- Lower Extremity Assessment Details Patient Name: [name] Date of Service: 02/26/2017 9:15 AM Medical Record Number: [id number] Patient Account Number: [id number] Date of Birth/Sex: 1980/08/23 (36 y.o. Male) Treating RN: [name] Primary Care Chanique Duca: [name] Other Clinician: Referring Rodnisha Blomgren: [name] Treating Kieryn Burtis/Extender: Ricard Dillon Weeks in Treatment: 12 Edema Assessment Assessed: [Left: No] [Right: No] E[Left: dema] [Right: :] Calf Left: Right: Point of Measurement: 33  cm From Medial Instep 50 cm cm Ankle Left: Right: Point of Measurement: 11 cm From Medial Instep 30 cm cm Vascular Assessment Pulses: Dorsalis Pedis Palpable: [Left:Yes] Posterior Tibial Palpable: [Left:Yes] Extremity colors, hair growth, and conditions: Extremity Color: [Left:Hyperpigmented] [Right:Hyperpigmented] Hair Growth on Extremity: [Left:Yes] [Right:Yes] Temperature of Extremity: [Left:Warm] [Right:Warm] Capillary Refill: [Left:< 3 seconds] [Right:< 3 seconds] Toe Nail Assessment Left: Right: Thick: No Discolored: No Deformed: No Improper Length and Hygiene: No Electronic Signature(s) Signed: 02/28/2017 4:29:15 PM By: [name], [name], RN, [name], [name], [name] Entered By: [name], [name], RN, [name], [name] on 02/26/2017 09:37:38 Ziesmer, [name] ([id number]) Bethanne Ginger, [place] ([id number]) --------------------------------------------------------------------------------  Multi Wound Chart Details Patient Name: [name], [name] Date of Service: 02/26/2017 9:15 AM Medical Record Number: [id number] Patient Account Number: [id number] Date of Birth/Sex: 07-22-1980 (36 y.o. Male) Treating RN: [name] Primary Care Suzetta Timko: [name] Other Clinician: Referring Jaisean Monteforte: [name] Treating Kathe Wirick/Extender: Ricard Dillon Weeks in Treatment: 12 Vital Signs Height(in): 71 Pulse(bpm): 90 Weight(lbs): 338 Blood Pressure 125/72 (mmHg): Body Mass Index(BMI): 47 Temperature(F): 98.3 Respiratory Rate 16 (breaths/min): Photos: [N/A:N/A] Wound Location: Left, Lateral Lower Leg Right, Posterior Lower N/A Leg Wounding Event: Gradually Appeared Surgical Injury N/A Primary Etiology: Vasculitis Open Surgical Wound N/A Date Acquired: 11/18/2015 02/02/2017 N/A Weeks of Treatment: 12 3 N/A Wound Status: Open Open N/A Measurements L x W x D 4.8x3.5x0.2 0.9x1x0.2 N/A (cm) Area (cm) : 13.195 0.707 N/A Volume (cm) : 2.639 0.141 N/A % Reduction in Area: -168.80% -42.80% N/A % Reduction in  Volume: 32.80% -42.40% N/A Classification: Full Thickness Without Partial Thickness N/A Exposed Support Structures Periwound Skin Texture: No Abnormalities Noted No Abnormalities Noted N/A Periwound Skin No Abnormalities Noted No Abnormalities Noted N/A Moisture: Periwound Skin Color: No Abnormalities Noted No Abnormalities Noted N/A Tenderness on No No N/A Palpation: Procedures Performed: Compression Therapy N/A N/A [name], HARMES ([id number]) Treatment Notes Electronic Signature(s) Signed: 02/26/2017 4:12:03 PM By: Linton Ham MD Entered By: Linton Ham on 02/26/2017 10:55:48 [name] ([id number]) -------------------------------------------------------------------------------- Multi-Disciplinary Care Plan Details Patient Name: [name] Date of Service: 02/26/2017 9:15 AM Medical Record Number: [id number] Patient Account Number: [id number] Date of Birth/Sex: 01-25-1981 (36 y.o. Male) Treating RN: [name] Primary Care Kaidan Harpster: [name] Other Clinician: Referring Eeva Schlosser: [name] Treating Ziyonna Christner/Extender: Ricard Dillon Weeks in Treatment: 12 Active Inactive ` Orientation to the Wound Care Program Nursing Diagnoses: Knowledge deficit related to the wound healing center program Goals: Patient/caregiver will verbalize understanding of the [place] Program Date Initiated: 12/11/2016 Target Resolution Date: 03/13/2017 Goal Status: Active Interventions: Provide education on orientation to the wound center Notes: ` Venous Leg Ulcer Nursing Diagnoses: Knowledge deficit related to disease process and management Potential for venous Insuffiency (use before diagnosis confirmed) Goals: Non-invasive venous studies are completed as ordered Date Initiated: 12/11/2016 Target Resolution Date: 03/13/2017 Goal Status: Active Patient will maintain optimal edema control Date Initiated: 12/11/2016 Target Resolution Date: 03/13/2017 Goal  Status: Active Patient/caregiver will verbalize understanding of disease process and disease management Date Initiated: 12/11/2016 Target Resolution Date: 03/13/2017 Goal Status: Active Verify adequate tissue perfusion prior to therapeutic compression application Date Initiated: 12/11/2016 Target Resolution Date: 03/13/2017 Goal Status: Active Krygier, Zae ([id number]) Interventions: Assess peripheral edema status every visit. Compression as ordered Provide education on venous insufficiency Treatment Activities: Therapeutic compression applied : 12/11/2016 Notes: ` Wound/Skin Impairment Nursing Diagnoses: Impaired tissue integrity Knowledge deficit related to ulceration/compromised skin integrity Goals: Patient/caregiver will verbalize understanding of skin care regimen Date Initiated: 12/11/2016 Target Resolution Date: 03/13/2017 Goal Status: Active Ulcer/skin breakdown will have a volume reduction of 30% by week 4 Date Initiated: 12/11/2016 Target Resolution Date: 03/13/2017 Goal Status: Active Ulcer/skin breakdown will have a volume reduction of 50% by week 8 Date Initiated: 12/11/2016 Target Resolution Date: 03/13/2017 Goal Status: Active Ulcer/skin breakdown will have a volume reduction of 80% by week 12 Date Initiated: 12/11/2016 Target Resolution Date: 03/13/2017 Goal Status: Active Ulcer/skin breakdown will heal within 14 weeks Date Initiated: 12/11/2016 Target Resolution Date: 03/13/2017 Goal Status: Active Interventions: Assess patient/caregiver ability to obtain necessary supplies Assess patient/caregiver ability to perform ulcer/skin care regimen upon admission and as needed Assess ulceration(s) every  visit Provide education on ulcer and skin care Treatment Activities: Skin care regimen initiated : 12/11/2016 Topical wound management initiated : 12/11/2016 Notes: HARLEM, BULA ([id number]) Electronic Signature(s) Signed: 02/28/2017 4:29:15 PM By: [name], [name], RN,  [name], [name], [name] Previous Signature: 02/26/2017 9:47:44 AM Version By: [name], [name], RN, [name], [name], [name] Entered By: [name], [name], RN, [name], [name] on 02/26/2017 10:03:46 [name] ([id number]) -------------------------------------------------------------------------------- Pain Assessment Details Patient Name: [name] Date of Service: 02/26/2017 9:15 AM Medical Record Number: [id number] Patient Account Number: [id number] Date of Birth/Sex: 01-04-1981 (36 y.o. Male) Treating RN: [name] Primary Care Yolonda Purtle: [name] Other Clinician: Referring Bastien Strawser: [name] Treating Tonette Koehne/Extender: Tito Dine in Treatment: 12 Active Problems Location of Pain Severity and Description of Pain Patient Has Paino Yes Site Locations Pain Location: Pain in Ulcers With Dressing Change: No Duration of the Pain. Constant / Intermittento Intermittent Rate the pain. Current Pain Level: 6 Worst Pain Level: 8 Character of Pain Describe the Pain: Aching, Burning, Throbbing Pain Management and Medication Current Pain Management: Goals for Pain Management Topical or injectable lidocaine is offered to patient for acute pain when surgical debridement is performed. If needed, Patient is instructed to use over the counter pain medication for the following 24-48 hours after debridement. Wound care MDs do not prescribed pain medications. Patient has chronic pain or uncontrolled pain. Patient has been instructed to make an appointment with their Primary Care Physician for pain management. Electronic Signature(s) Signed: 02/28/2017 4:29:15 PM By: [name], [name], RN, [name], [name], [name] Entered By: [name], [name], RN, [name], [name] on 02/26/2017 09:27:57 [name] ([id number]) -------------------------------------------------------------------------------- Patient/Caregiver Education Details Patient Name: [name] Date of Service: 02/26/2017 9:15 AM Medical Record Number:  [id number] Patient Account Number: [id number] Date of Birth/Gender: April 27, 1981 (36 y.o. Male) Treating RN: [name] Primary Care Physician: [name] Other Clinician: Referring Physician: [name] Treating Physician/Extender: Tito Dine in Treatment: 12 Education Assessment Education Provided To: Patient Education Topics Provided Venous: Handouts: Controlling Swelling with Multilayered Compression Wraps Methods: Demonstration Responses: State content correctly Wound/Skin Impairment: Handouts: Caring for Your Ulcer, Other: wound care as prescribed Methods: Demonstration Responses: State content correctly Electronic Signature(s) Signed: 02/28/2017 4:29:15 PM By: [name], [name], RN, [name], [name], [name] Entered By: [name], [name], RN, [name], [name] on 02/26/2017 12:42:57 [name] ([id number]) -------------------------------------------------------------------------------- Wound Assessment Details Patient Name: [name] Date of Service: 02/26/2017 9:15 AM Medical Record Number: [id number] Patient Account Number: [id number] Date of Birth/Sex: Feb 26, 1981 (36 y.o. Male) Treating RN: [name] Primary Care Lorri Fukuhara: [name] Other Clinician: Referring Azizi Bally: [name] Treating Coraline Talwar/Extender: Ricard Dillon Weeks in Treatment: 12 Wound Status Wound Number: 1 Primary Etiology: Vasculitis Wound Location: Left, Lateral Lower Leg Wound Status: Open Wounding Event: Gradually Appeared Date Acquired: 11/18/2015 Weeks Of Treatment: 12 Clustered Wound: No Photos Photo Uploaded By: [name], [name], RN, [name], [name] on 02/26/2017 09:40:01 Wound Measurements Length: (cm) 4.8 Width: (cm) 3.5 Depth: (cm) 0.2 Area: (cm) 13.195 Volume: (cm) 2.639 % Reduction in Area: -168.8% % Reduction in Volume: 32.8% Wound Description Full Thickness Without Exposed Classification: Support Structures Periwound Skin Texture Texture Color No Abnormalities Noted:  No No Abnormalities Noted: No Moisture No Abnormalities Noted: No Treatment Notes Wound #1 (Left, Lateral Lower Leg) 1. Cleansed with: Clean wound with Normal Saline Durocher, Dhairya ([id number]) 2. Anesthetic Topical Lidocaine 4% cream to wound bed prior to debridement 4. Dressing Applied: Other dressing (specify in notes) 5. Secondary Dressing Applied ABD Pad 7. Secured with Tape  3 Layer Compression System - Left Lower Extremity Notes SIlvercell, unna to anchor, BFD on right Electronic Signature(s) Signed: 02/28/2017 4:29:15 PM By: [name], [name], RN, [name], [name], [name] Entered By: [name], [name], RN, [name], [name] on 02/26/2017 09:35:31 [name] ([id number]) -------------------------------------------------------------------------------- Wound Assessment Details Patient Name: [name] Date of Service: 02/26/2017 9:15 AM Medical Record Number: [id number] Patient Account Number: [id number] Date of Birth/Sex: Sep 09, 1980 (36 y.o. Male) Treating RN: [name] Primary Care Peggy Loge: [name] Other Clinician: Referring Danasia Baker: [name] Treating Aleighna Wojtas/Extender: Ricard Dillon Weeks in Treatment: 12 Wound Status Wound Number: 2 Primary Etiology: Open Surgical Wound Wound Location: Right, Posterior Lower Leg Wound Status: Open Wounding Event: Surgical Injury Date Acquired: 02/02/2017 Weeks Of Treatment: 3 Clustered Wound: No Photos Photo Uploaded By: [name], [name], RN, [name], [name] on 02/26/2017 09:40:21 Wound Measurements Length: (cm) 0.9 Width: (cm) 1 Depth: (cm) 0.2 Area: (cm) 0.707 Volume: (cm) 0.141 % Reduction in Area: -42.8% % Reduction in Volume: -42.4% Wound Description Classification: Partial Thickness Periwound Skin Texture Texture Color No Abnormalities Noted: No No Abnormalities Noted: No Moisture No Abnormalities Noted: No Treatment Notes Wound #2 (Right, Posterior Lower Leg) 1. Cleansed with: Clean wound with Normal Saline 2.  Anesthetic Henion, Levan ([id number]) Topical Lidocaine 4% cream to wound bed prior to debridement 4. Dressing Applied: Other dressing (specify in notes) 5. Secondary Dressing Applied ABD Pad 7. Secured with Tape 3 Layer Compression System - Left Lower Extremity Notes SIlvercell, unna to anchor, BFD on right Electronic Signature(s) Signed: 02/28/2017 4:29:15 PM By: [name], [name], RN, [name], [name], [name] Entered By: [name], [name], RN, [name], [name] on 02/26/2017 09:35:32 [name] ([id number]) -------------------------------------------------------------------------------- [place] Details Patient Name: [name] Date of Service: 02/26/2017 9:15 AM Medical Record Number: [id number] Patient Account Number: [id number] Date of Birth/Sex: Oct 05, 1980 (36 y.o. Male) Treating RN: [name] Primary Care Marjorie Deprey: [name] Other Clinician: Referring Audwin Semper: [name] Treating Misty Foutz/Extender: Tito Dine in Treatment: 12 Vital Signs Time Taken: 09:28 Temperature (F): 98.3 Height (in): 71 Pulse (bpm): 90 Weight (lbs): 338 Respiratory Rate (breaths/min): 16 Body Mass Index (BMI): 47.1 Blood Pressure (mmHg): 125/72 Reference Range: 80 - 120 mg / dl Electronic Signature(s) Signed: 02/28/2017 4:29:15 PM By: [name], [name], RN, [name], [name], [name] Entered By: [name], [name], RN, [name], [name] on 02/26/2017 22:97:98

## 2017-03-02 NOTE — Progress Notes (Signed)
MARVENS, HOLLARS ([id number]) Visit Report for 02/26/2017 HPI Details Patient Name: MILLAN, LEGAN Date of Service: 02/26/2017 9:15 AM Medical Record Number: [id number] Patient Account Number: [id number] Date of Birth/Sex: Mar 31, 1981 (36 y.o. Male) Treating RN: Huel Coventry Primary Care Provider: [name] Other Clinician: Referring Provider: [name] Treating Provider/Extender: Maxwell Caul Weeks in Treatment: 12 History of Present Illness HPI Description: 12/04/16; 36 year old man who comes into the clinic today for review of a wound on the posterior left calf. He tells me that is been there for about a year. He is not a diabetic he does smoke half a pack per day. He was seen in the ER on 11/20/16 felt to have cellulitis around the wound and was given clindamycin. An x-ray did not show osteomyelitis. The patient initially tells me that he has a milk allergy that sets off a pruritic itching rash on his lower legs which she scratches incessantly and he thinks that's what may have set up the wound. He has been using various topical antibiotics and ointments without any effect. He works in a [employer]ing [employer] and is on his feet all day. He does not have a prior history of wounds however he does have the rash on both lower legs the right arm and the ventral aspect of his left arm. These are excoriations and clearly have had scratching however there are of macular looking areas on both legs including a substantial larger area on the right leg. This does not have an underlying open area. There is no blistering. 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 He seemed to recognize this. 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 He is not in any treatment for this. He's had no  recent travel 12/11/16; the patient arrives today with his wound and roughly the same condition we've been using silver alginate this is a deep punched out wound with some surrounding erythema but no tenderness. Biopsy I did did not show confirmed pyoderma gangrenosum suggested nonspecific inflammation and vasculitis but does not provide an actual description of what was seen by the pathologist. I'm really not able to understand this We have also received information from the patient's dermatologist in [place] notes from April 2016. This was a doctor Agarwal-antal. 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 He was prescribed topical steroid high potency under occlusion which helped but at this point the patient did not have a deep punched out wound. 12/18/16; the patient's wound is larger in terms of surface area however this surface looks better and there is less depth. The surrounding erythema also is better. The patient states that the wrap we put on came off 2 days ago when he has been using his compression stockings. He we are in the process of getting a dermatology consult. 12/26/16 on evaluation today patient's left lower extremity wound shows evidence of infection with surrounding erythema noted. He has been tolerating the dressing changes but states that he has noted more discomfort. There is a larger area of erythema surrounding the wound. No fevers, chills, nausea, or vomiting noted at [facility], [name] ([id number]) this time. With that being said the wound still does have slough covering the surface. He is not allergic to any medication that he is aware of  at this point. In regard to his right lower extremity he had several regions that are erythematous and pruritic he wonders if there's anything we can do to help that. 01/02/17 I reviewed patient's wound culture which was obtained his visit last week. He was placed on doxycycline at that point. Unfortunately that does not appear  to be an antibiotic that would likely help with the situation however the pseudomonas noted on culture is sensitive to Cipro. Also unfortunately patient's wound seems to have a large compared to last week's evaluation. Not severely so but there are definitely increased measurements in general. He is continuing to have discomfort as well he writes this to be a seven out of 10. In fact he would prefer me not to perform any debridement today due to the fact that he is having discomfort and considering he has an active infection on the little reluctant to do so anyway. No fevers, chills, nausea, or vomiting noted at this time. 01/08/17; patient seems dermatology on September 5. I suspect dermatology will want the slides from the biopsy I did sent to their pathologist. I'm not sure if there is a way we can expedite that. In any case the culture I did before I left on vacation 3 weeks ago showed Pseudomonas he was given 10 days of Cipro and per her description of her intake nurses is actually somewhat better this week although the wound is quite a bit bigger than I remember the last time I saw this. He still has 3 more days of Cipro 01/21/17; dermatology appointment tomorrow. He has completed the ciprofloxacin for Pseudomonas. Surface of the wound looks better however he is had some deterioration in the lesions on his right leg. Meantime the left lateral leg wound we will continue with sample 01/29/17; patient had his dermatology appointment but I can't yet see that note. He is completed his antibiotics. The wound is more superficial but considerably larger in circumferential area than when he came in. This is in his left lateral calf. He also has swollen erythematous areas with superficial wounds on the right leg and small papular areas on both arms. There apparently areas in her his upper thighs and buttocks I did not look at those. Dermatology biopsied the right leg. Hopefully will have their input  next week. 02/05/17; patient went back to see his dermatologist who told him that he had a "scratching problem" as well as staph. He is now on a 30 day course of doxycycline and I believe she gave him triamcinolone cream to the right leg areas to help with the itching [not exactly sure but probably triamcinolone]. She apparently looked at the left lateral leg wound although this was not rebiopsied and I think felt to be ultimately part of the same pathogenesis. He is using sample border foam and changing nevus himself. He now has a new open area on the right posterior leg which was his biopsy site I don't have any of the dermatology notes 02/12/17; we put the patient in compression last week with SANTYL to the wound on the left leg and the biopsy. Edema is much better and the depth of the wound is now at level of skin. Area is still the same oBiopsy site on the right lateral leg we've also been using santyl with a border foam dressing and he is changing this himself. 02/19/17; Using silver alginate started last week to both the substantial left leg wound and the biopsy site on the right wound. He  is tolerating compression well. Has a an appointment with his primary M.D. tomorrow wondering about diuretics although I'm wondering if the edema problem is actually lymphedema 02/26/17; the patient has been to see his primary doctor Dr. Jerelyn Charles at [place] our primary care. She started him on Lasix 20 mg and this seems to have helped with the edema. However we are not making substantial change with the left lateral calf wound and inflammation. The biopsy site on the right leg also looks stable but not really all that different. Electronic Signature(s) Signed: 02/26/2017 4:12:03 PM By: Baltazar Najjar MD Entered By: Baltazar Najjar on 02/26/2017 10:58:19 JAECEON, MICHELIN ([id number]) LISANDRO, MEGGETT ([id number]) -------------------------------------------------------------------------------- Physical  Exam Details Patient Name: [name] Date of Service: 02/26/2017 9:15 AM Medical Record Number: [id number] Patient Account Number: [id number] Date of Birth/Sex: 1981-01-26 (36 y.o. Male) Treating RN: Huel Coventry Primary Care Provider: [name] Other Clinician: Referring Provider: [name] Treating Provider/Extender: Maxwell Caul Weeks in Treatment: 12 Constitutional Sitting or standing Blood Pressure is within target range for patient.. Pulse regular and within target range for patient.Marland Kitchen Respirations regular, non-labored and within target range.. Temperature is normal and within the target range for the patient.Marland Kitchen appears in no distress. Notes Wound exam; edema control is better. The large wound on the left lateral leg is now superficial i.e. has improved in depth and the appearance of the wound bed is better i.e. not requiring debridement nevertheless no real signs of epithelialization. There is surrounding erythema which is not tender oThe biopsy site on the right leg really hasn't changed that much as well. Electronic Signature(s) Signed: 02/26/2017 4:12:03 PM By: Baltazar Najjar MD Entered By: Baltazar Najjar on 02/26/2017 10:59:48 [name] ([id number]) -------------------------------------------------------------------------------- Physician Orders Details Patient Name: [name] Date of Service: 02/26/2017 9:15 AM Medical Record Number: [id number] Patient Account Number: [id number] Date of Birth/Sex: 1980-08-29 (36 y.o. Male) Treating RN: Huel Coventry Primary Care Provider: [name] Other Clinician: Referring Provider: [name] Treating Provider/Extender: Altamese Hastings in Treatment: 12 Verbal / Phone Orders: No Diagnosis Coding Wound Cleansing Wound #1 Left,Lateral Lower Leg o May Shower, gently pat wound dry prior to applying new dressing. o No tub bath. Wound #2 Right,Posterior Lower Leg o May Shower, gently pat  wound dry prior to applying new dressing. o No tub bath. Anesthetic Wound #1 Left,Lateral Lower Leg o Topical Lidocaine 4% cream applied to wound bed prior to debridement Wound #2 Right,Posterior Lower Leg o Topical Lidocaine 4% cream applied to wound bed prior to debridement Primary Wound Dressing Wound #1 Left,Lateral Lower Leg o Silvercel Non-Adherent Wound #2 Right,Posterior Lower Leg o Silvercel Non-Adherent Secondary Dressing Wound #1 Left,Lateral Lower Leg o ABD pad Wound #2 Right,Posterior Lower Leg o Boardered Foam Dressing Dressing Change Frequency Wound #1 Left,Lateral Lower Leg o Change dressing every week Wound #2 Right,Posterior Lower Leg o Change dressing every other day. MAXAMUS, COLAO ([id number]) Follow-up Appointments Wound #1 Left,Lateral Lower Leg o Return Appointment in 1 week. Wound #2 Right,Posterior Lower Leg o Return Appointment in 1 week. Edema Control Wound #1 Left,Lateral Lower Leg o 3 Layer Compression System - Left Lower Extremity - unna to anchor o Elevate legs to the level of the heart and pump ankles as often as possible Additional Orders / Instructions Wound #1 Left,Lateral Lower Leg o Increase protein intake. o Activity as tolerated Medications-please add to medication list. Wound #1 Left,Lateral Lower Leg o P.O. Antibiotics - Continnue Abx o Topical Antibiotic - TAC o Santyl Enzymatic  Ointment Services and Therapies o Venous Studies -Bilateral Electronic Signature(s) Signed: 02/26/2017 4:12:03 PM By: Baltazar Najjar MD Signed: 02/28/2017 4:29:15 PM By: Elliot Gurney, [name], RN, [name], [name], [name] Entered By: Elliot Gurney, [name], RN, [name], [name] on 02/26/2017 10:06:22 [name] ([id number]) -------------------------------------------------------------------------------- Problem List Details Patient Name: [name] Date of Service: 02/26/2017 9:15 AM Medical Record Number: [id number] Patient Account Number:  [id number] Date of Birth/Sex: [birth year] (36 y.o. 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 Male) Treating RN: Huel Coventry Primary Care Provider: [name] Other Clinician: Referring Provider: [name] Treating Provider/Extender: Maxwell Caul Weeks in Treatment: 12 Subjective History of Present Illness (HPI) 12/04/16; 36 year old man who comes into the clinic today for review of a wound on the posterior left calf. He tells me that is been there for about a year. He is not a diabetic he does smoke half a pack per day. He was seen in the ER on 11/20/16 felt to have cellulitis around the wound and was given clindamycin. An x-ray did not show osteomyelitis. The patient initially tells me that he has a milk allergy that sets off a pruritic itching rash on his lower legs which she scratches incessantly and he thinks that's what may have set up the wound. He has been using various topical antibiotics and ointments without any effect. He works in a [employer]ing [employer] and is on his feet all day. He does not have a prior history of wounds however he does  have the rash on both lower legs the right arm and the ventral aspect of his left arm. These are excoriations and clearly have had scratching however there are of macular looking areas on both legs including a substantial larger area on the right leg. This does not have an underlying open area. There is no blistering. 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 He seemed to recognize this. 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 He is not in any treatment for this. He's had no recent travel 12/11/16; the patient arrives today with his wound and roughly the same condition we've been using silver alginate this is a deep punched out wound with some surrounding erythema but no tenderness. Biopsy I did did not show confirmed pyoderma gangrenosum suggested nonspecific inflammation and vasculitis but does not provide an actual description of what was seen by the pathologist. I'm really not able to understand this We have also received information from the patient's dermatologist in [place] notes from April 2016. This was a doctor Agarwal-antal. 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 He was prescribed topical steroid high potency under occlusion which helped but at this point the patient did not have  a deep punched out wound. 12/18/16; the patient's wound is larger in terms of surface area however this surface looks better and there is less depth. The surrounding erythema also is better. The patient states that the wrap we put on came off 2 days ago when he has been using his compression stockings. He we are in the process of getting a dermatology consult. 12/26/16 on evaluation today patient's left lower extremity wound shows evidence of infection with surrounding erythema  noted. He has been tolerating the dressing changes but states that he has noted more discomfort. There is a larger area of erythema surrounding the wound. No fevers, chills, nausea, or vomiting noted at this time. With that being said the wound still does have slough covering the surface. He is not allergic to any medication that he is aware of at this point. In regard to his right lower extremity he had several regions that are erythematous and pruritic he wonders if there's anything we can do to help that. LYRIC, HOAR ([id number]) 01/02/17 I reviewed patient's wound culture which was obtained his visit last week. He was placed on doxycycline at that point. Unfortunately that does not appear to be an antibiotic that would likely help with the situation however the pseudomonas noted on culture is sensitive to Cipro. Also unfortunately patient's wound seems to have a large compared to last week's evaluation. Not severely so but there are definitely increased measurements in general. He is continuing to have discomfort as well he writes this to be a seven out of 10. In fact he would prefer me not to perform any debridement today due to the fact that he is having discomfort and considering he has an active infection on the little reluctant to do so anyway. No fevers, chills, nausea, or vomiting noted at this time. 01/08/17; patient seems dermatology on September 5. I suspect dermatology will want the slides from the biopsy I did sent to their pathologist. I'm not sure if there is a way we can expedite that. In any case the culture I did before I left on vacation 3 weeks ago showed Pseudomonas he was given 10 days of Cipro and per her description of her intake nurses is actually somewhat better this week although the wound is quite a bit bigger than I remember the last time I saw this. He still has 3 more days of Cipro 01/21/17; dermatology appointment tomorrow. He has completed the ciprofloxacin for  Pseudomonas. Surface of the wound looks better however he is had some deterioration in the lesions on his right leg. Meantime the left lateral leg wound we will continue with sample 01/29/17; patient had his dermatology appointment but I can't yet see that note. He is completed his antibiotics. The wound is more superficial but considerably larger in circumferential area than when he came in. This is in his left lateral calf. He also has swollen erythematous areas with superficial wounds on the right leg and small papular areas on both arms. There apparently areas in her his upper thighs and buttocks I did not look at those. Dermatology biopsied the right leg. Hopefully will have their input next week. 02/05/17; patient went back to see his dermatologist who told him that he had a "scratching problem" as well as staph. He is now on a 30 day course of doxycycline and I believe she gave him triamcinolone cream to the right leg areas to help with the itching [not exactly sure but probably triamcinolone]. She apparently looked at  the left lateral leg wound although this was not rebiopsied and I think felt to be ultimately part of the same pathogenesis. He is using sample border foam and changing nevus himself. He now has a new open area on the right posterior leg which was his biopsy site I don't have any of the dermatology notes 02/12/17; we put the patient in compression last week with SANTYL to the wound on the left leg and the biopsy. Edema is much better and the depth of the wound is now at level of skin. Area is still the same Biopsy site on the right lateral leg we've also been using santyl with a border foam dressing and he is changing this himself. 02/19/17; Using silver alginate started last week to both the substantial left leg wound and the biopsy site on the right wound. He is tolerating compression well. Has a an appointment with his primary M.D. tomorrow wondering about diuretics  although I'm wondering if the edema problem is actually lymphedema 02/26/17; the patient has been to see his primary doctor Dr. Jerelyn Charles at [place] our primary care. She started him on Lasix 20 mg and this seems to have helped with the edema. However we are not making substantial change with the left lateral calf wound and inflammation. The biopsy site on the right leg also looks stable but not really all that different. Objective Hari, Cambell ([id number]) Constitutional Sitting or standing Blood Pressure is within target range for patient.. Pulse regular and within target range for patient.Marland Kitchen Respirations regular, non-labored and within target range.. Temperature is normal and within the target range for the patient.Marland Kitchen appears in no distress. Vitals Time Taken: 9:28 AM, Height: 71 in, Weight: 338 lbs, BMI: 47.1, Temperature: 98.3 F, Pulse: 90 bpm, Respiratory Rate: 16 breaths/min, Blood Pressure: 125/72 mmHg. General Notes: Wound exam; edema control is better. The large wound on the left lateral leg is now superficial i.e. has improved in depth and the appearance of the wound bed is better i.e. not requiring debridement nevertheless no real signs of epithelialization. There is surrounding erythema which is not tender The biopsy site on the right leg really hasn't changed that much as well. Integumentary (Hair, Skin) Wound #1 status is Open. Original cause of wound was Gradually Appeared. The wound is located on the Left,Lateral Lower Leg. The wound measures 4.8cm length x 3.5cm width x 0.2cm depth; 13.195cm^2 area and 2.639cm^3 volume. Wound #2 status is Open. Original cause of wound was Surgical Injury. The wound is located on the Right,Posterior Lower Leg. The wound measures 0.9cm length x 1cm width x 0.2cm depth; 0.707cm^2 area and 0.141cm^3 volume. 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 There was a Three Layer Compression Therapy Procedure with a pre-treatment ABI of 1.2 by Huel Coventry, RN. Post procedure Diagnosis Wound #1: Same as Pre-Procedure Eleazer, Kavari ([id number]) Plan Wound Cleansing: Wound #1 Left,Lateral Lower Leg: May Shower, gently pat wound dry prior to applying new dressing. No tub bath. Wound #2 Right,Posterior Lower Leg: May Shower, gently pat wound dry prior to applying new dressing. No tub bath. Anesthetic: Wound #1 Left,Lateral Lower Leg: Topical Lidocaine 4% cream applied to wound bed prior to debridement Wound #2 Right,Posterior Lower Leg: Topical Lidocaine 4% cream applied to wound bed prior to debridement Primary Wound Dressing: Wound #  1 Left,Lateral Lower Leg: Silvercel Non-Adherent Wound #2 Right,Posterior Lower Leg: Silvercel Non-Adherent Secondary Dressing: Wound #1 Left,Lateral Lower Leg: ABD pad Wound #2 Right,Posterior Lower Leg: Boardered Foam Dressing Dressing Change Frequency: Wound #1 Left,Lateral Lower Leg: Change dressing every week Wound #2 Right,Posterior Lower Leg: Change dressing every other day. Follow-up Appointments: Wound #1 Left,Lateral Lower Leg: Return Appointment in 1 week. Wound #2 Right,Posterior Lower Leg: Return Appointment in 1 week. Edema Control: Wound #1 Left,Lateral Lower Leg: 3 Layer Compression System - Left Lower Extremity - unna to anchor Elevate legs to the level of the heart and pump ankles as often as possible Additional Orders / Instructions: Wound #1 Left,Lateral Lower Leg: Increase protein intake. Activity as tolerated Medications-please add to medication list.: Wound #1 Left,Lateral Lower Leg: P.O. Antibiotics - Continnue Abx Topical Antibiotic - TAC Santyl Enzymatic Ointment AKSH, SWART ([id number]) Services and Therapies ordered  were: Venous Studies -Bilateral 1. continue with silver alginate/ABDs/ 3 layer comrpession 2 Venous reflux studeis are in order 3 no evidence of an arterial issue Electronic Signature(s) Signed: 02/26/2017 4:12:03 PM By: Baltazar Najjar MD Entered By: Baltazar Najjar on 02/26/2017 11:02:36 [name] ([id number]) -------------------------------------------------------------------------------- SuperBill Details Patient Name: [name] Date of Service: 02/26/2017 Medical Record Number: [id number] Patient Account Number: [id number] Date of Birth/Sex: March 10, 1981 (36 y.o. 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

## 2017-03-03 ENCOUNTER — Ambulatory Visit
Admit: 2017-03-03 | Discharge: 2017-03-03 | Payer: Commercial Managed Care - PPO | Attending: Dermatology | Admitting: Dermatology

## 2017-03-03 DIAGNOSIS — D485 Neoplasm of uncertain behavior of skin: Principal | ICD-10-CM

## 2017-03-03 DIAGNOSIS — L982 Febrile neutrophilic dermatosis [Sweet]: Secondary | ICD-10-CM | POA: Diagnosis not present

## 2017-03-03 DIAGNOSIS — L97223 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sis of muscle: Secondary | ICD-10-CM | POA: Diagnosis not present

## 2017-03-04 NOTE — Progress Notes (Signed)
[name], [name] ([id number]) Visit Report for 03/03/2017 Arrival Information Details Patient Name: [name] Date of Service: 03/03/2017 8:15 AM Medical Record Number: [id number] Patient Account Number: [id number] Date of Birth/Sex: 1981-03-24 (36 y.o. Male) Treating RN: [name] Primary Care [name]: [name] Other Clinician: Referring [name]: [name] Treating [name]/Extender: [name] in Treatment: 12 Visit Information History Since Last Visit All ordered tests and consults were completed: No Patient Arrived: Ambulatory Added or deleted any medications: No Arrival Time: 08:23 Any new allergies or adverse reactions: No Accompanied By: self Had a fall or experienced change in No Transfer Assistance: None activities of daily living that may affect Patient Identification Verified: Yes risk of falls: Secondary Verification Process Yes Signs or symptoms of abuse/neglect since last No Completed: visito Patient Requires Transmission-Based No Hospitalized since last visit: No Precautions: Has Dressing in Place as Prescribed: Yes Patient Has Alerts: No Has Compression in Place as Prescribed: Yes Pain Present Now: No Electronic Signature(s) Signed: 03/03/2017 3:06:33 PM By: [name] Entered By: [name] on 03/03/2017 15:06:33 [name] ([id number]) -------------------------------------------------------------------------------- Encounter Discharge Information Details Patient Name: [name] Date of Service: 03/03/2017 8:15 AM Medical Record Number: [id number] Patient Account Number: [id number] Date of Birth/Sex: May 18, 1981 (36 y.o. Male) Treating RN: [name] Primary Care Annelie Boak: [name] Other Clinician: Referring [name]: [name] Treating [name]/Extender: [name] in Treatment: 12 Encounter Discharge Information Items Discharge Pain Level: 0 Discharge Condition: Stable Ambulatory Status:  Ambulatory Discharge Destination: Home Private Transportation: Auto Accompanied By: self Schedule Follow-up Appointment: Yes Medication Reconciliation completed and No provided to Patient/Care [name]: Clinical Summary of Care: Electronic Signature(s) Signed: 03/03/2017 3:06:54 PM By: [name] Entered By: [name] on 03/03/2017 15:06:54 [name] ([id number]) -------------------------------------------------------------------------------- Patient/Caregiver Education Details Patient Name: [name] Date of Service: 03/03/2017 8:15 AM Medical Record Number: [id number] Patient Account Number: [id number] Date of Birth/Gender: 06-16-1980 (36 y.o. Male) Treating RN: [name] Primary Care Physician: [name] Other Clinician: Referring Physician: Alma [name] Treating Physician/Extender: [name] in Treatment: 12 Education Assessment Education Provided To: Patient Education Topics Provided Wound/Skin Impairment: Handouts: Other: do not get wrap wet and change dressing as ordered Methods: Demonstration, Explain/Verbal Responses: State content correctly Electronic Signature(s) Signed: 03/03/2017 4:50:17 PM By: [name] Entered By: [name] on 03/03/2017 15:06:47 [name] ([id number]) -------------------------------------------------------------------------------- Wound Assessment Details Patient Name: [name] Date of Service: 03/03/2017 8:15 AM Medical Record Number: [id number] Patient Account Number: [id number] Date of Birth/Sex: 1981/03/04 (36 y.o. Male) Treating RN: [name], [name] Primary Care Lessa Huge: [name] Other Clinician: Referring [name]: [name] Treating [name]/Extender: [name] in Treatment: 12 Wound Status Wound Number: 1 Primary Etiology: Vasculitis Wound Location: Left Lower Leg - Lateral Wound Status: Open Wounding Event: Gradually Appeared Comorbid Sleep Apnea,  Hypertension, History: Colitis Date Acquired: 11/18/2015 Weeks Of Treatment: 12 Clustered Wound: No Photos Photo Uploaded By: [name] on 03/03/2017 16:29:10 Wound Measurements Length: (cm) 4.9 Width: (cm) 4.3 Depth: (cm) 0.2 Area: (cm) 16.548 Volume: (cm) 3.31 % Reduction in Area: -237.1% % Reduction in Volume: 15.7% Epithelialization: None Tunneling: No Undermining: No Wound Description Full Thickness Without Exposed Classification: Support Structures Wound Margin: Distinct, outline attached Exudate Large Amount: Exudate Type: Serosanguineous Exudate Color: red, brown Foul Odor After Cleansing: Yes Due to Product Use: No Slough/Fibrino Yes Wound Bed Granulation Amount: Medium (34-66%) Granulation Quality: Red Necrotic Amount: Medium (34-66%) [name] ([id number]) Necrotic Quality: Adherent Slough Periwound Skin Texture Texture Color No Abnormalities Noted: No No Abnormalities Noted: No Erythema: Yes Moisture Erythema Location: Circumferential  No Abnormalities Noted: No Rubor: Yes Temperature / Pain Temperature: No Abnormality Tenderness on Palpation: Yes Wound Preparation Ulcer Cleansing: Rinsed/Irrigated with Saline, Other: soap and water, Topical Anesthetic Applied: None Treatment Notes Wound #1 (Left, Lateral Lower Leg) 1. Cleansed with: Clean wound with Normal Saline 2. Anesthetic Topical Lidocaine 4% cream to wound bed prior to debridement 4. Dressing Applied: Other dressing (specify in notes) 5. Secondary Dressing Applied ABD Pad 7. Secured with Tape 3 Layer Compression System - Left Lower Extremity Notes SIlvercell, unna to anchor, BFD on right Electronic Signature(s) Signed: 03/03/2017 4:37:26 PM By: [name] Entered By: [name] on 03/03/2017 08:34:22 [name] ([id number]) -------------------------------------------------------------------------------- Wound Assessment Details Patient Name: [name] Date of Service: 03/03/2017 8:15 AM Medical Record Number: [id number] Patient Account Number: [id number] Date of Birth/Sex: 1980-06-07 (36 y.o. Male) Treating RN: [name] Primary Care Anessa Charley: [name] Other Clinician: Referring [place]: [name] Treating Tyner Codner/Extender: [name] in Treatment: 12 Wound Status Wound Number: 2 Primary Etiology: Open Surgical Wound Wound Location: Right Lower Leg - Posterior Wound Status: Open Wounding Event: Surgical Injury Comorbid Sleep Apnea, Hypertension, History: Colitis Date Acquired: 02/02/2017 Weeks Of Treatment: 3 Clustered Wound: No Photos Photo Uploaded By: [name] on 03/03/2017 16:29:52 Wound Measurements Length: (cm) 1.2 Width: (cm) 1.4 Depth: (cm) 0.2 Area: (cm) 1.319 Volume: (cm) 0.264 % Reduction in Area: -166.5% % Reduction in Volume: -166.7% Epithelialization: None Tunneling: No Undermining: No Wound Description Classification: Partial Thickness Foul Odor Af Wound Margin: Distinct, outline attached Due to Produ Exudate Amount: Large Slough/Fibri Exudate Type: Serosanguineous Exudate Color: red, brown ter Cleansing: Yes ct Use: No no Yes Wound Bed Granulation Amount: Medium (34-66%) Granulation Quality: Red Necrotic Amount: Medium (34-66%) Necrotic Quality: Adherent [place], [place] ([id number]) Periwound Skin Texture Texture Color No Abnormalities Noted: No No Abnormalities Noted: No Erythema: Yes Moisture Erythema Location: Circumferential No Abnormalities Noted: No Temperature / Pain Temperature: No Abnormality Tenderness on Palpation: Yes Wound Preparation Ulcer Cleansing: Rinsed/Irrigated with Saline Topical Anesthetic Applied: None Treatment Notes Wound #2 (Right, Posterior Lower Leg) 1. Cleansed with: Clean wound with Normal Saline 2. Anesthetic Topical Lidocaine 4% cream to wound bed prior to debridement 4. Dressing Applied: Other  dressing (specify in notes) Notes SIlvercell, BFD on right Electronic Signature(s) Signed: 03/03/2017 4:37:26 PM By: [name] Entered By: [name] on 03/03/2017 08:31:03

## 2017-03-05 ENCOUNTER — Ambulatory Visit
Admission: RE | Admit: 2017-03-05 | Discharge: 2017-03-05 | Payer: Commercial Managed Care - PPO | Attending: Dermatology | Admitting: Dermatology

## 2017-03-05 DIAGNOSIS — L299 Pruritus, unspecified: Principal | ICD-10-CM

## 2017-03-05 DIAGNOSIS — L88 Pyoderma gangrenosum: Secondary | ICD-10-CM

## 2017-03-05 DIAGNOSIS — A4901 Methicillin susceptible Staphylococcus aureus infection, unspecified site: Secondary | ICD-10-CM

## 2017-03-05 MED ORDER — DOXYCYCLINE MONOHYDRATE 100 MG CAPSULE
ORAL_CAPSULE | Freq: Two times a day (BID) | ORAL | 0 refills | 0 days | Status: CP
Start: 2017-03-05 — End: 2017-04-16

## 2017-03-06 ENCOUNTER — Other Ambulatory Visit: Payer: Self-pay | Admitting: *Deleted

## 2017-03-07 ENCOUNTER — Other Ambulatory Visit: Payer: Self-pay | Admitting: Internal Medicine

## 2017-03-07 ENCOUNTER — Other Ambulatory Visit (INDEPENDENT_AMBULATORY_CARE_PROVIDER_SITE_OTHER): Payer: 59

## 2017-03-07 DIAGNOSIS — R609 Edema, unspecified: Secondary | ICD-10-CM | POA: Diagnosis not present

## 2017-03-07 DIAGNOSIS — L299 Pruritus, unspecified: Secondary | ICD-10-CM | POA: Diagnosis not present

## 2017-03-10 MED ORDER — CLOBETASOL 0.05 % TOPICAL OINTMENT
Freq: Two times a day (BID) | TOPICAL | 6 refills | 0 days | Status: CP
Start: 2017-03-10 — End: 2017-10-20

## 2017-03-12 ENCOUNTER — Encounter: Payer: 59 | Admitting: Internal Medicine

## 2017-03-12 DIAGNOSIS — T8189XA Other complications of procedures, not elsewhere classified, initial encounter: Secondary | ICD-10-CM | POA: Diagnosis not present

## 2017-03-12 DIAGNOSIS — S81802A Unspecified open wound, left lower leg, initial encounter: Secondary | ICD-10-CM | POA: Diagnosis not present

## 2017-03-12 DIAGNOSIS — L97223 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sis of muscle: Secondary | ICD-10-CM | POA: Diagnosis not present

## 2017-03-14 NOTE — Progress Notes (Signed)
ADISON, JERGER ([id number]) Visit Report for 03/12/2017 Arrival Information Details Patient Name: [name], [name] Date of Service: 03/12/2017 8:15 AM Medical Record Number: [id number] Patient Account Number: [id number] Date of Birth/Sex: Jul 03, 1980 (36 y.o. Male) Treating RN: [name] Primary Care [name] Rankin: [name] Other Clinician: Referring Tobenna Needs: [name] Treating Trini Christiansen/Extender: Tito Dine in Treatment: 14 Visit Information History Since Last Visit Added or deleted any medications: No Patient Arrived: Ambulatory Any new allergies or adverse reactions: No Arrival Time: 08:21 Had a fall or experienced change in No Accompanied By: self activities of daily living that may affect Transfer Assistance: None risk of falls: Patient Identification Verified: Yes Signs or symptoms of abuse/neglect since last visito No Secondary Verification Process Completed: Yes Hospitalized since last visit: No Patient Requires Transmission-Based No Has Dressing in Place as Prescribed: No Precautions: Has Compression in Place as Prescribed: No Patient Has Alerts: No Pain Present Now: Yes Electronic Signature(s) Signed: 03/12/2017 5:14:29 PM By: [name], [name], RN, [name], [name], [name] Entered By: [name], [name], RN, [name], [name] on 03/12/2017 08:22:12 [name] ([id number]) -------------------------------------------------------------------------------- Clinic Level of Care Assessment Details Patient Name: [name] Date of Service: 03/12/2017 8:15 AM Medical Record Number: [id number] Patient Account Number: [id number] Date of Birth/Sex: [birth year] (36 y.o. Male) Treating RN: [name] Primary Care Taliah Porche: [name] Other Clinician: Referring Karston Hyland: [name] Treating Darrall Strey/Extender: Tito Dine in Treatment: 14 Clinic Level of Care Assessment Items TOOL 4 Quantity Score []  - Use when only an EandM is performed on FOLLOW-UP visit  0 ASSESSMENTS - Nursing Assessment / Reassessment []  - Reassessment of Co-morbidities (includes updates in patient status) 0 X- 1 5 Reassessment of Adherence to Treatment Plan ASSESSMENTS - Wound and Skin Assessment / Reassessment []  - Simple Wound Assessment / Reassessment - one wound 0 X- 2 5 Complex Wound Assessment / Reassessment - multiple wounds []  - 0 Dermatologic / Skin Assessment (not related to wound area) ASSESSMENTS - Focused Assessment []  - Circumferential Edema Measurements - multi extremities 0 []  - 0 Nutritional Assessment / Counseling / Intervention []  - 0 Lower Extremity Assessment (monofilament, tuning fork, pulses) []  - 0 Peripheral Arterial Disease Assessment (using hand held doppler) ASSESSMENTS - Ostomy and/or Continence Assessment and Care []  - Incontinence Assessment and Management 0 []  - 0 Ostomy Care Assessment and Management (repouching, etc.) PROCESS - Coordination of Care []  - Simple Patient / Family Education for ongoing care 0 X- 1 20 Complex (extensive) Patient / Family Education for ongoing care X- 1 10 Staff obtains Programmer, systems, Records, Test Results / Process Orders []  - 0 Staff telephones HHA, Nursing Homes / Clarify orders / etc []  - 0 Routine Transfer to another Facility (non-emergent condition) []  - 0 Routine Hospital Admission (non-emergent condition) []  - 0 New Admissions / Biomedical engineer / Ordering NPWT, Apligraf, etc. []  - 0 Emergency Hospital Admission (emergent condition) []  - 0 Simple Discharge Coordination [name], [name] ([id number]) X- 1 15 Complex (extensive) Discharge Coordination PROCESS - Special Needs []  - Pediatric / Minor Patient Management 0 []  - 0 Isolation Patient Management []  - 0 Hearing / Language / Visual special needs []  - 0 Assessment of Community assistance (transportation, D/C planning, etc.) []  - 0 Additional assistance / Altered mentation []  - 0 Support Surface(s) Assessment (bed, cushion,  seat, etc.) INTERVENTIONS - Wound Cleansing / Measurement []  - Simple Wound Cleansing - one wound 0 X- 2 5 Complex Wound Cleansing - multiple wounds X- 1 5 Wound Imaging (photographs - any number  of wounds) []  - 0 Wound Tracing (instead of photographs) []  - 0 Simple Wound Measurement - one wound X- 2 5 Complex Wound Measurement - multiple wounds INTERVENTIONS - Wound Dressings []  - Small Wound Dressing one or multiple wounds 0 X- 2 15 Medium Wound Dressing one or multiple wounds []  - 0 Large Wound Dressing one or multiple wounds []  - 0 Application of Medications - topical []  - 0 Application of Medications - injection INTERVENTIONS - Miscellaneous []  - External ear exam 0 []  - 0 Specimen Collection (cultures, biopsies, blood, body fluids, etc.) []  - 0 Specimen(s) / Culture(s) sent or taken to Lab for analysis []  - 0 Patient Transfer (multiple staff / Civil Service fast streamer / Similar devices) []  - 0 Simple Staple / Suture removal (25 or less) []  - 0 Complex Staple / Suture removal (26 or more) []  - 0 Hypo / Hyperglycemic Management (close monitor of Blood Glucose) []  - 0 Ankle / Brachial Index (ABI) - do not check if billed separately X- 1 5 Vital Signs [name], [name] ([id number]) Has the patient been seen at the hospital within the last three years: Yes Total Score: 120 Level Of Care: New/Established - Level 4 Electronic Signature(s) Signed: 03/12/2017 5:14:29 PM By: [name], [name], RN, [name], [name], [name] Entered By: [name], [name], RN, [name], [name] on 03/12/2017 08:56:43 [name] ([id number]) -------------------------------------------------------------------------------- Encounter Discharge Information Details Patient Name: [name] Date of Service: 03/12/2017 8:15 AM Medical Record Number: [id number] Patient Account Number: [id number] Date of Birth/Sex: 1980/06/19 (36 y.o. Male) Treating RN: [name] Primary Care Monico Sudduth: [name] Other Clinician: Referring Sunnie Odden:  [name] Treating Laquan Ludden/Extender: Tito Dine in Treatment: 14 Encounter Discharge Information Items Discharge Pain Level: 3 Discharge Condition: Stable Ambulatory Status: Ambulatory Discharge Destination: Home Private Transportation: Auto Accompanied By: self Schedule Follow-up Appointment: Yes Medication Reconciliation completed and provided Yes to Patient/Care Dachelle Molzahn: Clinical Summary of Care: Electronic Signature(s) Signed: 03/12/2017 5:14:29 PM By: [name], [name], RN, [name], [name], [name] Entered By: [name], [name], RN, [name], [name] on 03/12/2017 08:58:02 [name] ([id number]) -------------------------------------------------------------------------------- Lower Extremity Assessment Details Patient Name: [name] Date of Service: 03/12/2017 8:15 AM Medical Record Number: [id number] Patient Account Number: [id number] Date of Birth/Sex: 1981-01-19 (36 y.o. Male) Treating RN: [name] Primary Care Robi Dewolfe: [name] Other Clinician: Referring Myliah Medel: [name] Treating Kyal Arts/Extender: Ricard Dillon Weeks in Treatment: 14 Edema Assessment Assessed: [Left: No] [Right: No] [Left: Edema] [Right: :] Calf Left: Right: Point of Measurement: 33 cm From Medial Instep 51.9 cm cm Ankle Left: Right: Point of Measurement: 11 cm From Medial Instep 32 cm cm Vascular Assessment Pulses: Dorsalis Pedis Palpable: [Left:Yes] [Right:Yes] Posterior Tibial Extremity colors, hair growth, and conditions: Extremity Color: [Left:Hyperpigmented] [Right:Red] Hair Growth on Extremity: [Left:Yes] [Right:Yes] Temperature of Extremity: [Left:Warm] [Right:Warm] Capillary Refill: [Left:< 3 seconds] [Right:< 3 seconds] Toe Nail Assessment Left: Right: Thick: No No Discolored: No No Deformed: No No Improper Length and Hygiene: No No Electronic Signature(s) Signed: 03/12/2017 5:14:29 PM By: [name], [name], RN, [name], [name], [name] Entered By: [name], [name], RN,  [name], [name] on 03/12/2017 08:38:42 [name] ([id number]) -------------------------------------------------------------------------------- Multi Wound Chart Details Patient Name: [name] Date of Service: 03/12/2017 8:15 AM Medical Record Number: [id number] Patient Account Number: [id number] Date of Birth/Sex: [birth year] (36 y.o. Male) Treating RN: [name] Primary Care Danyal Adorno: [name] Other Clinician: Referring Olivea Sonnen: [name] Treating Parveen Freehling/Extender: Ricard Dillon Weeks in Treatment: 14 Vital Signs Height(in): 71 Pulse(bpm): 97 Weight(lbs): 338 Blood Pressure(mmHg): 138/77 Body  Mass Index(BMI): 47 Temperature(F): 98.1 Respiratory Rate 16 (breaths/min): Photos: [N/A:N/A] Wound Location: Left Lower Leg - Lateral Right Lower Leg - Posterior N/A Wounding Event: Gradually Appeared Surgical Injury N/A Primary Etiology: Vasculitis Open Surgical Wound N/A Comorbid History: Sleep Apnea, Hypertension, Sleep Apnea, Hypertension, N/A Colitis Colitis Date Acquired: 11/18/2015 02/02/2017 N/A Weeks of Treatment: 14 5 N/A Wound Status: Open Open N/A Measurements L x W x D 5x4.2x0.3 1.4x1.2x0.2 N/A (cm) Area (cm) : 16.493 1.319 N/A Volume (cm) : 4.948 0.264 N/A % Reduction in Area: -236.00% -166.50% N/A % Reduction in Volume: -26.00% -166.70% N/A Classification: Full Thickness Without Partial Thickness N/A Exposed Support Structures Exudate Amount: Large Large N/A Exudate Type: Serosanguineous Serosanguineous N/A Exudate Color: red, brown red, brown N/A Foul Odor After Cleansing: Yes Yes N/A Odor Anticipated Due to No No N/A Product Use: Wound Margin: Distinct, outline attached Distinct, outline attached N/A Granulation Amount: Small (1-33%) Medium (34-66%) N/A Granulation Quality: Red Red N/A Necrotic Amount: Large (67-100%) Medium (34-66%) N/A Exposed Structures: Fat Layer (Subcutaneous Fat Layer (Subcutaneous N/A Tissue) Exposed: Yes Tissue)  Exposed: Yes Fascia: No Tendon: No Muscle: No [name], [name] ([id number]) Joint: No Bone: No Epithelialization: None None N/A Periwound Skin Texture: Induration: Yes Induration: Yes N/A Excoriation: No Scarring: Yes Callus: No Excoriation: No Crepitus: No Callus: No Rash: No Crepitus: No Scarring: No Rash: No Periwound Skin Moisture: Maceration: No Maceration: No N/A Dry/Scaly: No Dry/Scaly: No Periwound Skin Color: Erythema: Yes Erythema: Yes N/A Rubor: Yes Atrophie Blanche: No Atrophie Blanche: No Cyanosis: No Cyanosis: No Ecchymosis: No Ecchymosis: No Hemosiderin Staining: No Hemosiderin Staining: No Mottled: No Mottled: No Pallor: No Pallor: No Rubor: No Erythema Location: Circumferential Circumferential N/A Erythema Change: Increased N/A N/A Temperature: No Abnormality No Abnormality N/A Tenderness on Palpation: Yes Yes N/A Wound Preparation: Ulcer Cleansing: Ulcer Cleansing: N/A Rinsed/Irrigated with Saline Rinsed/Irrigated with Saline Topical Anesthetic Applied: Topical Anesthetic Applied: None, Other: Lidocaine 4% None, Other: Lidocaine 4% Treatment Notes Wound #1 (Left, Lateral Lower Leg) 1. Cleansed with: Clean wound with Normal Saline 2. Anesthetic Topical Lidocaine 4% cream to wound bed prior to debridement 3. Peri-wound Care: Ointment 4. Dressing Applied: Prisma Ag 5. Secondary Dressing Applied Bordered Foam Dressing Wound #2 (Right, Posterior Lower Leg) 1. Cleansed with: Clean wound with Normal Saline 2. Anesthetic Topical Lidocaine 4% cream to wound bed prior to debridement 3. Peri-wound Care: Ointment 4. Dressing Applied: Prisma Ag 5. Secondary Dressing Applied Bordered Foam Dressing [name], [name] ([id number]) Electronic Signature(s) Signed: 03/12/2017 5:28:55 PM By: Linton Ham MD Entered By: Linton Ham on 03/12/2017 09:20:14 [name]  ([id number]) -------------------------------------------------------------------------------- [place] Details Patient Name: [name] Date of Service: 03/12/2017 8:15 AM Medical Record Number: [id number] Patient Account Number: [id number] Date of Birth/Sex: 1981-04-30 (36 y.o. Male) Treating RN: [name] Primary Care Luvia Orzechowski: [name] Other Clinician: Referring Shalimar Mcclain: [name] Treating Jerek Meulemans/Extender: Tito Dine in Treatment: 14 Active Inactive ` Orientation to the Wound Care Program Nursing Diagnoses: Knowledge deficit related to the wound healing center program Goals: Patient/caregiver will verbalize understanding of the [place] Program Date Initiated: 12/11/2016 Target Resolution Date: 03/13/2017 Goal Status: Active Interventions: Provide education on orientation to the wound center Notes: ` Venous Leg Ulcer Nursing Diagnoses: Knowledge deficit related to disease process and management Potential for venous Insuffiency (use before diagnosis confirmed) Goals: Non-invasive venous studies are completed as ordered Date Initiated: 12/11/2016 Target Resolution Date: 03/13/2017 Goal Status: Active Patient will maintain optimal edema control Date Initiated: 12/11/2016 Target Resolution  Date: 03/13/2017 Goal Status: Active Patient/caregiver will verbalize understanding of disease process and disease management Date Initiated: 12/11/2016 Target Resolution Date: 03/13/2017 Goal Status: Active Verify adequate tissue perfusion prior to therapeutic compression application Date Initiated: 12/11/2016 Target Resolution Date: 03/13/2017 Goal Status: Active Interventions: Assess peripheral edema status every visit. Compression as ordered Provide education on venous insufficiency Treatment Activities: [name], [name] ([id number]) Therapeutic compression applied : 12/11/2016 Notes: ` Wound/Skin Impairment Nursing  Diagnoses: Impaired tissue integrity Knowledge deficit related to ulceration/compromised skin integrity Goals: Patient/caregiver will verbalize understanding of skin care regimen Date Initiated: 12/11/2016 Target Resolution Date: 03/13/2017 Goal Status: Active Ulcer/skin breakdown will have a volume reduction of 30% by week 4 Date Initiated: 12/11/2016 Target Resolution Date: 03/13/2017 Goal Status: Active Ulcer/skin breakdown will have a volume reduction of 50% by week 8 Date Initiated: 12/11/2016 Target Resolution Date: 03/13/2017 Goal Status: Active Ulcer/skin breakdown will have a volume reduction of 80% by week 12 Date Initiated: 12/11/2016 Target Resolution Date: 03/13/2017 Goal Status: Active Ulcer/skin breakdown will heal within 14 weeks Date Initiated: 12/11/2016 Target Resolution Date: 03/13/2017 Goal Status: Active Interventions: Assess patient/caregiver ability to obtain necessary supplies Assess patient/caregiver ability to perform ulcer/skin care regimen upon admission and as needed Assess ulceration(s) every visit Provide education on ulcer and skin care Treatment Activities: Skin care regimen initiated : 12/11/2016 Topical wound management initiated : 12/11/2016 Notes: Electronic Signature(s) Signed: 03/12/2017 5:14:29 PM By: [name], [name], RN, [name], [name], [name] Entered By: [name], [name], RN, [name], [name] on 03/12/2017 08:38:47 [name] ([id number]) -------------------------------------------------------------------------------- Pain Assessment Details Patient Name: [name] Date of Service: 03/12/2017 8:15 AM Medical Record Number: [id number] Patient Account Number: [id number] Date of Birth/Sex: [birth year] (36 y.o. Male) Treating RN: [name] Primary Care Nyron Mozer: [name] Other Clinician: Referring Elice Crigger: [name] Treating Rakim Moone/Extender: Ricard Dillon Weeks in Treatment: 14 Active Problems Location of Pain Severity and Description  of Pain Patient Has Paino No Site Locations Duration of the Pain. Constant / Intermittento Intermittent Rate the pain. Current Pain Level: 4 Character of Pain Describe the Pain: Other: stinging Pain Management and Medication Current Pain Management: Goals for Pain Management Topical or injectable lidocaine is offered to patient for acute pain when surgical debridement is performed. If needed, Patient is instructed to use over the counter pain medication for the following 24-48 hours after debridement. Wound care MDs do not prescribed pain medications. Patient has chronic pain or uncontrolled pain. Patient has been instructed to make an appointment with their Primary Care Physician for pain management. Electronic Signature(s) Signed: 03/12/2017 5:14:29 PM By: [name], [name], RN, [name], [name], [name] Entered By: [name], [name], RN, [name], [name] on 03/12/2017 08:22:38 [name] ([id number]) -------------------------------------------------------------------------------- Patient/Caregiver Education Details Patient Name: [name] Date of Service: 03/12/2017 8:15 AM Medical Record Number: [id number] Patient Account Number: [id number] Date of Birth/Gender: 07/05/1980 (36 y.o. Male) Treating RN: [name] Primary Care Physician: [name] Other Clinician: Referring Physician: [name] Treating Physician/Extender: Tito Dine in Treatment: 14 Education Assessment Education Provided To: Patient Education Topics Provided Wound/Skin Impairment: Handouts: Caring for Your Ulcer, Other: daily wound care as presceibed Methods: Demonstration Responses: State content correctly Electronic Signature(s) Signed: 03/12/2017 5:14:29 PM By: [name], [name], RN, [name], [name], [name] Entered By: [name], [name], RN, [name], [name] on 03/12/2017 08:58:20 [name] ([id number]) -------------------------------------------------------------------------------- Wound Assessment Details Patient Name:  [name] Date of Service: 03/12/2017 8:15 AM Medical Record Number: [id number] Patient Account Number: [id number] Date of Birth/Sex: 1981/02/18 (36 y.o. Male) Treating RN: [name],  [place] Primary Care Kai Railsback: [name] Other Clinician: Referring Jacub Waiters: [name] Treating Jakeya Gherardi/Extender: Tito Dine in Treatment: 14 Wound Status Wound Number: 1 Primary Etiology: Vasculitis Wound Location: Left Lower Leg - Lateral Wound Status: Open Wounding Event: Gradually Appeared Comorbid History: Sleep Apnea, Hypertension, Colitis Date Acquired: 11/18/2015 Weeks Of Treatment: 14 Clustered Wound: No Photos Wound Measurements Length: (cm) 5 Width: (cm) 4.2 Depth: (cm) 0.3 Area: (cm) 16.493 Volume: (cm) 4.948 % Reduction in Area: -236% % Reduction in Volume: -26% Epithelialization: None Tunneling: No Undermining: No Wound Description Full Thickness Without Exposed Support Classification: Structures Wound Margin: Distinct, outline attached Exudate Large Amount: Exudate Type: Serosanguineous Exudate Color: red, brown Foul Odor After Cleansing: Yes Due to Product Use: No Slough/Fibrino Yes Wound Bed Granulation Amount: Small (1-33%) Exposed Structure Granulation Quality: Red Fat Layer (Subcutaneous Tissue) Exposed: Yes Necrotic Amount: Large (67-100%) Necrotic Quality: Adherent Slough Periwound Skin Texture Texture Color No Abnormalities Noted: No No Abnormalities Noted: No Callus: No Atrophie Blanche: No Crepitus: No Cyanosis: No Excoriation: No Ecchymosis: No [name], [name] ([id number]) Induration: Yes Erythema: Yes Rash: No Erythema Location: Circumferential Scarring: No Erythema Change: Increased Hemosiderin Staining: No Moisture Mottled: No No Abnormalities Noted: No Pallor: No Dry / Scaly: No Rubor: Yes Maceration: No Temperature / Pain Temperature: No Abnormality Tenderness on Palpation: Yes Wound Preparation Ulcer  Cleansing: Rinsed/Irrigated with Saline Topical Anesthetic Applied: None, Other: Lidocaine 4%, Treatment Notes Wound #1 (Left, Lateral Lower Leg) 1. Cleansed with: Clean wound with Normal Saline 2. Anesthetic Topical Lidocaine 4% cream to wound bed prior to debridement 3. Peri-wound Care: Ointment 4. Dressing Applied: Prisma Ag 5. Secondary Dressing Applied Bordered Foam Dressing Electronic Signature(s) Signed: 03/12/2017 5:14:29 PM By: [name], [name], RN, [name], [name], [name] Entered By: [name], [name], RN, [name], [name] on 03/12/2017 08:32:16 [name] ([id number]) -------------------------------------------------------------------------------- Wound Assessment Details Patient Name: [name] Date of Service: 03/12/2017 8:15 AM Medical Record Number: [id number] Patient Account Number: [id number] Date of Birth/Sex: 1981/03/03 (36 y.o. Male) Treating RN: [name] Primary Care Jalyiah Shelley: [name] Other Clinician: Referring Greta Yung: [name] Treating Ellice Boultinghouse/Extender: Ricard Dillon Weeks in Treatment: 14 Wound Status Wound Number: 2 Primary Etiology: Open Surgical Wound Wound Location: Right Lower Leg - Posterior Wound Status: Open Wounding Event: Surgical Injury Comorbid History: Sleep Apnea, Hypertension, Colitis Date Acquired: 02/02/2017 Weeks Of Treatment: 5 Clustered Wound: No Photos Wound Measurements Length: (cm) 1.4 Width: (cm) 1.2 Depth: (cm) 0.2 Area: (cm) 1.319 Volume: (cm) 0.264 % Reduction in Area: -166.5% % Reduction in Volume: -166.7% Epithelialization: None Tunneling: No Undermining: No Wound Description Classification: Partial Thickness Wound Margin: Distinct, outline attached Exudate Amount: Large Exudate Type: Serosanguineous Exudate Color: red, brown Foul Odor After Cleansing: Yes Due to Product Use: No Slough/Fibrino Yes Wound Bed Granulation Amount: Medium (34-66%) Exposed Structure Granulation Quality: Red Fascia  Exposed: No Necrotic Amount: Medium (34-66%) Fat Layer (Subcutaneous Tissue) Exposed: Yes Necrotic Quality: Adherent Slough Tendon Exposed: No Muscle Exposed: No Joint Exposed: No Bone Exposed: No Periwound Skin Texture Texture Color No Abnormalities Noted: No No Abnormalities Noted: No Callus: No Atrophie Blanche: No Crepitus: No Cyanosis: No [name], [name] ([id number]) Excoriation: No Ecchymosis: No Induration: Yes Erythema: Yes Rash: No Erythema Location: Circumferential Scarring: Yes Hemosiderin Staining: No Mottled: No Moisture Pallor: No No Abnormalities Noted: No Rubor: No Dry / Scaly: No Maceration: No Temperature / Pain Temperature: No Abnormality Tenderness on Palpation: Yes Wound Preparation Ulcer Cleansing: Rinsed/Irrigated with Saline Topical Anesthetic Applied: None, Other: Lidocaine 4%, Treatment Notes Wound #  2 (Right, Posterior Lower Leg) 1. Cleansed with: Clean wound with Normal Saline 2. Anesthetic Topical Lidocaine 4% cream to wound bed prior to debridement 3. Peri-wound Care: Ointment 4. Dressing Applied: Prisma Ag 5. Secondary Dressing Applied Bordered Foam Dressing Electronic Signature(s) Signed: 03/12/2017 5:14:29 PM By: [name], [name], RN, [name], [name], [name] Entered By: [name], [name], RN, [name], [name] on 03/12/2017 08:35:33 [name] ([id number]) -------------------------------------------------------------------------------- [place] Details Patient Name: [name] Date of Service: 03/12/2017 8:15 AM Medical Record Number: [id number] Patient Account Number: [id number] Date of Birth/Sex: 1980-09-30 (36 y.o. Male) Treating RN: [name] Primary Care Lylee Corrow: [name] Other Clinician: Referring Sarahgrace Broman: [name] Treating Hira Trent/Extender: Tito Dine in Treatment: 14 Vital Signs Time Taken: 08:22 Temperature (F): 98.1 Height (in): 71 Pulse (bpm): 97 Weight (lbs): 338 Respiratory Rate (breaths/min):  16 Body Mass Index (BMI): 47.1 Blood Pressure (mmHg): 138/77 Reference Range: 80 - 120 mg / dl Electronic Signature(s) Signed: 03/12/2017 5:14:29 PM By: [name], [name], RN, [name], [name], [name] Entered By: [name], [name], RN, [name], [name] on 03/12/2017 08:22:53

## 2017-03-14 NOTE — Progress Notes (Signed)
[name] ([id number]) Visit Report for 03/12/2017 HPI Details Patient Name: [name], [name] Date of Service: 03/12/2017 8:15 AM Medical Record Number: [id number] Patient Account Number: [id number] Date of Birth/Sex: Oct 11, 1980 (36 y.o. Male) Treating RN: [name] Primary Care Provider: Vernona [name] Other Clinician: Referring Provider: Vernona [name] Treating Provider/Extender: [name] in Treatment: 14 History of Present Illness HPI Description: 12/04/16; 36 year old man who comes into the clinic today for review of a wound on the posterior left calf. He tells me that is been there for about a year. He is not a diabetic he does smoke half a pack per day. He was seen in the ER on 11/20/16 felt to have cellulitis around the wound and was given clindamycin. An x-ray did not show osteomyelitis. The patient initially tells me that he has a milk allergy that sets off a pruritic itching rash on his lower legs which she scratches incessantly and he thinks that's what may have set up the wound. He has been using various topical antibiotics and ointments without any effect. He works in a [employer]ing [employer] and is on his feet all day. He does not have a prior history of wounds however he does have the rash on both lower legs the right arm and the ventral aspect of his left arm. These are excoriations and clearly have had scratching however there are of macular looking areas on both legs including a substantial larger area on the right leg. This does not have an underlying open area. There is no blistering. 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 He seemed to recognize this. 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 He is not in any treatment for this. He's had no recent  travel 12/11/16; the patient arrives today with his wound and roughly the same condition we've been using silver alginate this is a deep punched out wound with some surrounding erythema but no tenderness. Biopsy I did did not show confirmed pyoderma gangrenosum suggested nonspecific inflammation and vasculitis but does not provide an actual description of what was seen by the pathologist. I'm really not able to understand this We have also received information from the patient's dermatologist in [place] notes from April 2016. This was a doctor [name]. 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 He was prescribed topical steroid high potency under occlusion which helped but at this point the patient did not have a deep punched out wound. 12/18/16; the patient's wound is larger in terms of surface area however this surface looks better and there is less depth. The surrounding erythema also is better. The patient states that the wrap we put on came off 2 days ago when he has been using his compression stockings. He we are in the process of getting a dermatology consult. 12/26/16 on evaluation today patient's left lower extremity wound shows evidence of infection with surrounding erythema noted. He has been tolerating the dressing changes but states that he has noted more discomfort. There is a larger area of erythema surrounding the wound. No fevers, chills, nausea, or vomiting noted at this time. With that being said the wound still does have slough covering the surface. He is not allergic to any medication that he is aware of at this  point. In regard to his right lower extremity he had several regions that are erythematous and pruritic he wonders if there's anything we can do to help that. 01/02/17 I reviewed patient's wound culture which was obtained his visit last week. He was placed on doxycycline at that point. Unfortunately that does not appear to be an antibiotic that would  likely help with the situation however the pseudomonas noted on culture is sensitive to Cipro. Also unfortunately patient's wound seems to have a large compared to last week's evaluation. Not severely so but there are definitely increased measurements in general. He is continuing to have discomfort as well he writes this to be a seven out of 10. In fact he would prefer me not to perform any debridement today due to the fact that he is having discomfort and considering he has an active infection on the little reluctant to do so anyway. No fevers, chills, nausea, or vomiting noted at this time. 01/08/17; patient seems dermatology on September 5. I suspect dermatology will want the slides from the biopsy I did sent to their pathologist. I'm not sure if there is a way we can expedite that. In any case the culture I did before I left on vacation 3 [name] ([id number]) [name] ago showed Pseudomonas he was given 10 days of Cipro and per her description of her intake nurses is actually somewhat better this week although the wound is quite a bit bigger than I remember the last time I saw this. He still has 3 more days of Cipro 01/21/17; dermatology appointment tomorrow. He has completed the ciprofloxacin for Pseudomonas. Surface of the wound looks better however he is had some deterioration in the lesions on his right leg. Meantime the left lateral leg wound we will continue with sample 01/29/17; patient had his dermatology appointment but I can't yet see that note. He is completed his antibiotics. The wound is more superficial but considerably larger in circumferential area than when he came in. This is in his left lateral calf. He also has swollen erythematous areas with superficial wounds on the right leg and small papular areas on both arms. There apparently areas in her his upper thighs and buttocks I did not look at those. Dermatology biopsied the right leg. Hopefully will have their input next  week. 02/05/17; patient went back to see his dermatologist who told him that he had a "scratching problem" as well as staph. He is now on a 30 day course of doxycycline and I believe she gave him triamcinolone cream to the right leg areas to help with the itching [not exactly sure but probably triamcinolone]. She apparently looked at the left lateral leg wound although this was not rebiopsied and I think felt to be ultimately part of the same pathogenesis. He is using sample border foam and changing nevus himself. He now has a new open area on the right posterior leg which was his biopsy site I don't have any of the dermatology notes 02/12/17; we put the patient in compression last week with SANTYL to the wound on the left leg and the biopsy. Edema is much better and the depth of the wound is now at level of skin. Area is still the same oBiopsy site on the right lateral leg we've also been using santyl with a border foam dressing and he is changing this himself. 02/19/17; Using silver alginate started last week to both the substantial left leg wound and the biopsy site on the right wound.  He is tolerating compression well. Has a an appointment with his primary M.D. tomorrow wondering about diuretics although I'm wondering if the edema problem is actually lymphedema 02/26/17; the patient has been to see his primary doctor Dr. Jerelyn Charles at [facility] our primary care. She started him on Lasix 20 mg and this seems to have helped with the edema. However we are not making substantial change with the left lateral calf wound and inflammation. The biopsy site on the right leg also looks stable but not really all that different. 03/12/17; the patient has been to see vein and vascular Dr. Wyn Quaker. He has had venous reflux studies I have not reviewed these. I did get a call from his dermatology office. They felt that he might have pathergy based on their biopsy on his right leg which led them to look at the slides  of the biopsy I did on the left leg and they wonder whether this represents pyoderma gangrenosum which was the original supposition in a man with ulcerative colitis albeit inactive for many years. They therefore recommended clobetasol and tetracycline i.e. 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 Electronic Signature(s) Signed: 03/12/2017 5:28:55 PM By: Baltazar Najjar MD Entered By: Baltazar Najjar on 03/12/2017 09:22:48 [name] ([id number]) -------------------------------------------------------------------------------- Physical Exam Details Patient Name: [name] Date of Service: 03/12/2017 8:15 AM Medical Record Number: [id number] Patient Account Number: [id number] Date of Birth/Sex: 1981/04/01 (36 y.o. Male) Treating RN: [name] Primary Care Provider: Vernona [name] Other Clinician: Referring Provider: Vernona [name] Treating Provider/Extender: [name] in Treatment: 14 Constitutional Sitting or standing Blood Pressure is within target range for patient.. Pulse regular and within target range for patient.Marland Kitchen Respirations regular, non-labored and within target range.. Temperature is normal and within the target range for the patient.Marland Kitchen appears in no distress. Eyes Conjunctivae clear. No discharge. Respiratory Respiratory effort is easy and symmetric bilaterally. Rate is normal at rest and on room air.. Cardiovascular Pedal pulses palpable and strong bilaterally.. Patient has no pitting. Integumentary (Hair, Skin) Other than the wound area on the left leg nothing abnormal. He has the original biopsy site done by dermatology on the right leg that is now the size of a quarter i.e. much larger and more inflamed. I think this is the issue raised by dermatology i.e. pathergy. Psychiatric No evidence of depression, anxiety, or agitation. Calm, cooperative, and communicative. Appropriate interactions and affect.. Notes Wound exam; edema control  is better. The large wound on the left lateral leg I think actually may measure slightly larger. It has a nonviable surfa and significant surroundin nontender erythema g ce On the right leg the biopsy site is really about the size of a quarter now with significant surrounding erythema. In keeping with the plan dictated by dermatology will be using clobetasol and oral doxycycline. We will not wrap his legs so we can do his steroid treatment daily and simply put border foam over this Electronic Signature(s) Signed: 03/12/2017 5:28:55 PM By: Baltazar Najjar MD Entered By: Baltazar Najjar on 03/12/2017 09:25:47 [name] ([id number]) -------------------------------------------------------------------------------- Physician Orders Details Patient Name: [name] Date of Service: 03/12/2017 8:15 AM Medical Record Number: [id number] Patient Account Number: [id number] Date of Birth/Sex: 1980-08-09 (36 y.o. Male) Treating RN: [name] Primary Care Provider: Vernona [name] Other Clinician: Referring Provider: Vernona [name] Treating Provider/Extender: [name] in Treatment: 14 Verbal / Phone Orders: No Diagnosis Coding Wound Cleansing Wound #1 Left,Lateral Lower Leg o Clean wound with Normal Saline. Wound #2 Right,Posterior Lower Leg o  Clean wound with Normal Saline. Anesthetic Wound #1 Left,Lateral Lower Leg o Topical Lidocaine 4% cream applied to wound bed prior to debridement Wound #2 Right,Posterior Lower Leg o Topical Lidocaine 4% cream applied to wound bed prior to debridement Skin Barriers/Peri-Wound Care Wound #1 Left,Lateral Lower Leg o Triamcinolone Acetonide Ointment - Clobetazole Wound #2 Right,Posterior Lower Leg o Triamcinolone Acetonide Ointment - Clobetazole Primary Wound Dressing Wound #1 Left,Lateral Lower Leg o Prisma Ag Wound #2 Right,Posterior Lower Leg o Prisma Ag Secondary Dressing Wound #1 Left,Lateral Lower Leg o  Boardered Foam Dressing Wound #2 Right,Posterior Lower Leg o Boardered Foam Dressing Dressing Change Frequency Wound #1 Left,Lateral Lower Leg o Change dressing every other day. Wound #2 Right,Posterior Lower Leg o Change dressing every other day. Follow-up Appointments BRACH, BIRDSALL ([id number]) Wound #1 Left,Lateral Lower Leg o Return Appointment in 2 [name]. Wound #2 Right,Posterior Lower Leg o Return Appointment in 2 [name]. Edema Control Wound #1 Left,Lateral Lower Leg o Elevate legs to the level of the heart and pump ankles as often as possible Wound #2 Right,Posterior Lower Leg o Elevate legs to the level of the heart and pump ankles as often as possible Electronic Signature(s) Signed: 03/12/2017 5:14:29 PM By: Elliot Gurney, [name], RN, [name], [name], [name] Signed: 03/12/2017 5:28:55 PM By: Baltazar Najjar MD Entered By: Elliot Gurney, [name], RN, [name], [name] on 03/12/2017 09:02:42 JULUS, KELLEY ([id number]) -------------------------------------------------------------------------------- Problem List Details Patient Name: [name] Date of Service: 03/12/2017 8:15 AM Medical Record Number: [id number] Patient Account Number: [id number] Date of Birth/Sex: 1980/09/23 (36 y.o. 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 Male) Treating RN: [name] Primary Care Provider: Vernona [name] Other Clinician: Referring Provider: Vernona [name] Treating Provider/Extender: [name] in Treatment: 14 Subjective History of Present Illness (HPI) 12/04/16; 36 year old man who comes into the clinic today for review of a wound on the posterior left calf. He tells me that is been there for about a year. He is not a diabetic he does smoke half a pack per day. He was seen in the ER on 11/20/16 felt to have cellulitis around the wound and was given clindamycin. An x-ray did not show osteomyelitis. The patient initially tells me that he has a milk allergy that sets off a pruritic itching rash on his lower legs which she scratches incessantly and he thinks that's what may have set up the wound. He has been using various topical antibiotics and ointments without any effect. He works in a [employer]ing [employer] and is on his feet all day. He does not have a prior history of wounds however he does have the rash on both lower legs the right arm and the ventral aspect of his left arm. These are excoriations and clearly have had scratching however there are of macular looking areas on both legs including a substantial larger area on the right leg. This does not have an underlying open area. There is no  blistering. 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 He seemed to recognize this. 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 He is not in any treatment for this. He's had no recent travel 12/11/16; the patient arrives today with his wound and roughly the  same condition we've been using silver alginate this is a deep punched out wound with some surrounding erythema but no tenderness. Biopsy I did did not show confirmed pyoderma gangrenosum suggested nonspecific inflammation and vasculitis but does not provide an actual description of what was seen by the pathologist. I'm really not able to understand this We have also received information from the patient's dermatologist in [place] notes from April 2016. This was a doctor [name]. 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 He was prescribed topical steroid high potency under occlusion which helped but at this point the patient did not have a deep punched out wound. 12/18/16; the patient's wound is larger in terms of surface area however this surface looks better and there is less depth. The surrounding erythema also is better. The patient states that the wrap we put on came off 2 days ago when he has been using his compression stockings. He we are in the process of getting a dermatology consult. 12/26/16 on evaluation today patient's left lower extremity wound shows evidence of infection with surrounding erythema noted. He has been tolerating the dressing changes but states that he has noted more discomfort. There is a larger area of erythema surrounding the wound. No fevers, chills, nausea, or vomiting noted at this time. With that being said the wound still does have slough covering the surface. He is not allergic to any medication that he is aware of at this point. In regard to his right lower extremity he had several regions that are erythematous and pruritic he wonders if there's anything we can do to help that. 01/02/17 I reviewed patient's wound culture which was obtained his visit last week. He was placed on doxycycline at that point. Unfortunately that does not appear to be an antibiotic that would likely help with the situation however the pseudomonas noted on culture is  sensitive to Cipro. Also unfortunately patient's wound seems to have a large compared to last week's evaluation. Not severely so but there are definitely increased measurements in general. He is continuing to have discomfort as well he writes this to be a seven out of 10. In fact he would prefer me not to perform any debridement today due to the fact that he is having discomfort and considering he has an active infection on the little reluctant to do so anyway. No fevers, chills, nausea, or vomiting noted at this time. 01/08/17; patient seems dermatology on September 5. I suspect dermatology will want the slides from the biopsy I did sent to their pathologist. I'm not sure if there is a way we can expedite that. In any case the culture I did before I left on vacation 3 [name] ago showed Pseudomonas he was given 10 days of Cipro and per her description of her intake nurses is actually somewhat better this week although the wound is quite a bit bigger than I remember the last time I saw this. He still has 3 Khim, Ibn ([id number])  more days of Cipro 01/21/17; dermatology appointment tomorrow. He has completed the ciprofloxacin for Pseudomonas. Surface of the wound looks better however he is had some deterioration in the lesions on his right leg. Meantime the left lateral leg wound we will continue with sample 01/29/17; patient had his dermatology appointment but I can't yet see that note. He is completed his antibiotics. The wound is more superficial but considerably larger in circumferential area than when he came in. This is in his left lateral calf. He also has swollen erythematous areas with superficial wounds on the right leg and small papular areas on both arms. There apparently areas in her his upper thighs and buttocks I did not look at those. Dermatology biopsied the right leg. Hopefully will have their input next week. 02/05/17; patient went back to see his dermatologist who told him that he  had a "scratching problem" as well as staph. He is now on a 30 day course of doxycycline and I believe she gave him triamcinolone cream to the right leg areas to help with the itching [not exactly sure but probably triamcinolone]. She apparently looked at the left lateral leg wound although this was not rebiopsied and I think felt to be ultimately part of the same pathogenesis. He is using sample border foam and changing nevus himself. He now has a new open area on the right posterior leg which was his biopsy site I don't have any of the dermatology notes 02/12/17; we put the patient in compression last week with SANTYL to the wound on the left leg and the biopsy. Edema is much better and the depth of the wound is now at level of skin. Area is still the same Biopsy site on the right lateral leg we've also been using santyl with a border foam dressing and he is changing this himself. 02/19/17; Using silver alginate started last week to both the substantial left leg wound and the biopsy site on the right wound. He is tolerating compression well. Has a an appointment with his primary M.D. tomorrow wondering about diuretics although I'm wondering if the edema problem is actually lymphedema 02/26/17; the patient has been to see his primary doctor Dr. Jerelyn Charles at [facility] our primary care. She started him on Lasix 20 mg and this seems to have helped with the edema. However we are not making substantial change with the left lateral calf wound and inflammation. The biopsy site on the right leg also looks stable but not really all that different. 03/12/17; the patient has been to see vein and vascular Dr. Wyn Quaker. He has had venous reflux studies I have not reviewed these. I did get a call from his dermatology office. They felt that he might have pathergy based on their biopsy on his right leg which led them to look at the slides of the biopsy I did on the left leg and they wonder whether this represents  pyoderma gangrenosum which was the original supposition in a man with ulcerative colitis albeit inactive for many years. They therefore recommended clobetasol and tetracycline i.e. 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 Objective Constitutional Sitting or standing Blood Pressure is within target range for patient.. Pulse regular and within target range for patient.Marland Kitchen Respirations regular, non-labored and within target range.. Temperature is normal and within the target range for the patient.Marland Kitchen appears in no distress. Vitals Time Taken: 8:22 AM, Height: 71 in, Weight: 338 lbs, BMI: 47.1, Temperature: 98.1 F, Pulse: 97 bpm, Respiratory Rate: 16 breaths/min, Blood Pressure:  138/77 mmHg. Eyes Conjunctivae clear. No discharge. Respiratory Respiratory effort is easy and symmetric bilaterally. Rate is normal at rest and on room air.. Cardiovascular Pedal pulses palpable and strong bilaterally.. Patient has no pitting. MALIKHI, OGAN ([id number]) Psychiatric No evidence of depression, anxiety, or agitation. Calm, cooperative, and communicative. Appropriate interactions and affect.. General Notes: Wound exam; edema control is better. The large wound on the left lateral leg I think actually may measure slightly larger. It has a nonviable surfa and significant surroundin nontender erythema g ce On the right leg the biopsy site is really about the size of a quarter now with significant surrounding erythema. In keeping with the plan dictated by dermatology will be using clobetasol and oral doxycycline. We will not wrap his legs so we can do his steroid treatment daily and simply put border foam over this Integumentary (Hair, Skin) Other than the wound area on the left leg nothing abnormal. He has the original biopsy site done by dermatology on the right leg that is now the size of a quarter i.e. much larger and more inflamed. I think this is the issue raised by dermatology  i.e. pathergy. Wound #1 status is Open. Original cause of wound was Gradually Appeared. The wound is located on the Left,Lateral Lower Leg. The wound measures 5cm length x 4.2cm width x 0.3cm depth; 16.493cm^2 area and 4.948cm^3 volume. There is Fat Layer (Subcutaneous Tissue) Exposed exposed. There is no tunneling or undermining noted. There is a large amount of serosanguineous drainage noted. Foul odor after cleansing was noted. The wound margin is distinct with the outline attached to the wound base. There is small (1-33%) red granulation within the wound bed. There is a large (67-100%) amount of necrotic tissue within the wound bed including Adherent Slough. The periwound skin appearance exhibited: Induration, Rubor, Erythema. The periwound skin appearance did not exhibit: Callus, Crepitus, Excoriation, Rash, Scarring, Dry/Scaly, Maceration, Atrophie Blanche, Cyanosis, Ecchymosis, Hemosiderin Staining, Mottled, Pallor. The surrounding wound skin color is noted with erythema which is circumferential. Periwound temperature was noted as No Abnormality. The periwound has tenderness on palpation. Wound #2 status is Open. Original cause of wound was Surgical Injury. The wound is located on the Right,Posterior Lower Leg. The wound measures 1.4cm length x 1.2cm width x 0.2cm depth; 1.319cm^2 area and 0.264cm^3 volume. There is Fat Layer (Subcutaneous Tissue) Exposed exposed. There is no tunneling or undermining noted. There is a large amount of serosanguineous drainage noted. Foul odor after cleansing was noted. The wound margin is distinct with the outline attached to the wound base. There is medium (34-66%) red granulation within the wound bed. There is a medium (34-66%) amount of necrotic tissue within the wound bed including Adherent Slough. The periwound skin appearance exhibited: Induration, Scarring, Erythema. The periwound skin appearance did not exhibit: Callus, Crepitus, Excoriation, Rash,  Dry/Scaly, Maceration, Atrophie Blanche, Cyanosis, Ecchymosis, Hemosiderin Staining, Mottled, Pallor, Rubor. The surrounding wound skin color is noted with erythema which is circumferential. Periwound temperature was noted as No Abnormality. The periwound has tenderness on palpation. 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 MARCELLUS, PULLIAM ([id number]) Wound #2 Right,Posterior Lower Leg: Clean wound with Normal Saline. Anesthetic: Wound #1 Left,Lateral Lower Leg: Topical Lidocaine 4% cream applied to wound bed prior to debridement Wound #2 Right,Posterior Lower Leg: Topical Lidocaine 4% cream applied to wound bed prior  to debridement Skin Barriers/Peri-Wound Care: Wound #1 Left,Lateral Lower Leg: Triamcinolone Acetonide Ointment - Clobetazole Wound #2 Right,Posterior Lower Leg: Triamcinolone Acetonide Ointment - Clobetazole Primary Wound Dressing: Wound #1 Left,Lateral Lower Leg: Prisma Ag Wound #2 Right,Posterior Lower Leg: Prisma Ag Secondary Dressing: Wound #1 Left,Lateral Lower Leg: Boardered Foam Dressing Wound #2 Right,Posterior Lower Leg: Boardered Foam Dressing Dressing Change Frequency: Wound #1 Left,Lateral Lower Leg: Change dressing every other day. Wound #2 Right,Posterior Lower Leg: Change dressing every other day. Follow-up Appointments: Wound #1 Left,Lateral Lower Leg: Return Appointment in 2 [name]. Wound #2 Right,Posterior Lower Leg: Return Appointment in 2 [name]. Edema Control: Wound #1 Left,Lateral Lower Leg: Elevate legs to the level of the heart and pump ankles as often as possible Wound #2 Right,Posterior Lower Leg: Elevate legs to the level of the heart and pump ankles as often as possible #1 the patient is on doxycycline although I am not quite  sure of the dose #2 clobetasol ointment or cream as ordered by dermatology to the wound #3 there is no good option for a secondary dressing although I was thinking about collegen and we will simply cover this with border foam #4 follow-up in 2 [name] Electronic Signature(s) Signed: 03/12/2017 5:28:55 PM By: Baltazar Najjarobson, Leahmarie Gasiorowski MD Entered By: Baltazar Najjarobson, Jeniece Hannis on 03/12/2017 09:26:58 [name], [name] ([id number]) [name], [name] ([id number]) -------------------------------------------------------------------------------- SuperBill Details Patient Name: [name], [name] Date of Service: 03/12/2017 Medical Record Number: [id number] Patient Account Number: [id number] Date of Birth/Sex: 12/08/1980 [age] y.o. Male) Treating RN: [name] CoventryWoody, [name] Primary Care Provider: Vernona RiegerLARK, KATHERINE Other Clinician: Referring Provider: Vernona RiegerLARK, KATHERINE Treating Provider/Extender: [name] CaulOBSON, Zierra Laroque G [name] in Treatment: 14 Diagnosis Coding ICD-10 Codes Code Description [phone] 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sis of muscle I87.2 Venous insufficiency (chronic) (peripheral) Facility Procedures CPT4 Code: [id number] Description: 99214 - WOUND CARE VISIT-LEV 4 EST PT Modifier: Quantity: 1 Physician Procedures CPT4 Code: [id number] Description: 99213 - WC PHYS LEVEL 3 - EST PT ICD-10 Diagnosis Description L97.223 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sis of m I87.2 Venous insufficiency (chronic) (peripheral) Modifier: uscle Quantity: 1 Electronic Signature(s) Signed: 03/12/2017 5:28:55 PM By: Baltazar Najjarobson, Lousie Calico MD Entered By: Baltazar Najjarobson, Dimas Scheck on 03/12/2017 01:02:7209:27:22

## 2017-03-26 ENCOUNTER — Encounter: Payer: 59 | Attending: Internal Medicine | Admitting: Internal Medicine

## 2017-03-26 ENCOUNTER — Other Ambulatory Visit
Admission: RE | Admit: 2017-03-26 | Discharge: 2017-03-26 | Disposition: A | Discharge status: Home / Self Care | Payer: 59 | Source: Ambulatory Visit | Attending: Internal Medicine | Admitting: Internal Medicine

## 2017-03-26 ENCOUNTER — Ambulatory Visit
Admission: RE | Admit: 2017-03-26 | Discharge: 2017-03-26 | Payer: Commercial Managed Care - PPO | Attending: Dermatology | Admitting: Dermatology

## 2017-03-26 DIAGNOSIS — L98499 Non-pressure chronic ulcer of skin of other sites with unspecified severity: Secondary | ICD-10-CM

## 2017-03-26 DIAGNOSIS — L88 Pyoderma gangrenosum: Principal | ICD-10-CM

## 2017-03-26 DIAGNOSIS — L97223 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sis of muscle: Secondary | ICD-10-CM | POA: Insufficient documentation

## 2017-03-26 DIAGNOSIS — T8189XA Other complications of procedures, not elsewhere classified, initial encounter: Secondary | ICD-10-CM | POA: Diagnosis not present

## 2017-03-26 DIAGNOSIS — I872 Venous insufficiency (chronic) (peripheral): Secondary | ICD-10-CM | POA: Insufficient documentation

## 2017-03-26 DIAGNOSIS — L089 Local infection of the skin and subcutaneous tissue, unspecified: Secondary | ICD-10-CM | POA: Diagnosis not present

## 2017-03-28 NOTE — Progress Notes (Signed)
TRADARIUS, REINWALD ([id number]) Visit Report for 03/26/2017 HPI Details Patient Name: [name], [name] Date of Service: 03/26/2017 8:15 AM Medical Record Number: [id number] Patient Account Number: [id number] Date of Birth/Sex: 07-23-1980 (36 y.o. Male) Treating RN: Huel Coventry Primary Care Provider: [name] Other Clinician: Referring Provider: [name] Treating Provider/Extender: Maxwell Caul Weeks in Treatment: 16 History of Present Illness HPI Description: 12/04/16; 36 year old man who comes into the clinic today for review of a wound on the posterior left calf. He tells me that is been there for about a year. He is not a diabetic he does smoke half a pack per day. He was seen in the ER on 11/20/16 felt to have cellulitis around the wound and was given clindamycin. An x-ray did not show osteomyelitis. The patient initially tells me that he has a milk allergy that sets off a pruritic itching rash on his lower legs which she scratches incessantly and he thinks that's what may have set up the wound. He has been using various topical antibiotics and ointments without any effect. He works in a [employer]ing [employer] and is on his feet all day. He does not have a prior history of wounds however he does have the rash on both lower legs the right arm and the ventral aspect of his left arm. These are excoriations and clearly have had scratching however there are of macular looking areas on both legs including a substantial larger area on the right leg. This does not have an underlying open area. There is no blistering. 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 He seemed to recognize this. 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 He is not in any treatment for this. He's had no recent  travel 12/11/16; the patient arrives today with his wound and roughly the same condition we've been using silver alginate this is a deep punched out wound with some surrounding erythema but no tenderness. Biopsy I did did not show confirmed pyoderma gangrenosum suggested nonspecific inflammation and vasculitis but does not provide an actual description of what was seen by the pathologist. I'm really not able to understand this We have also received information from the patient's dermatologist in [place] notes from April 2016. This was a doctor Agarwal- antal. 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 He was prescribed topical steroid high potency under occlusion which helped but at this point the patient did not have a deep punched out wound. 12/18/16; the patient's wound is larger in terms of surface area however this surface looks better and there is less depth. The surrounding erythema also is better. The patient states that the wrap we put on came off 2 days ago when he has been using his compression stockings. He we are in the process of getting a dermatology consult. 12/26/16 on evaluation today patient's left lower extremity wound shows evidence of infection with surrounding erythema noted. He has been tolerating the dressing changes but states that he has noted more discomfort. There is a larger area of erythema surrounding the wound. No fevers, chills, nausea, or vomiting noted at this time. With that being said the wound still does have slough covering the surface. He is not allergic to any medication that he is aware of at this  point. In regard to his right lower extremity he had several regions that are erythematous and pruritic he wonders if there's anything we can do to help that. 01/02/17 I reviewed patient's wound culture which was obtained his visit last week. He was placed on doxycycline at that point. Unfortunately that does not appear to be an antibiotic that would  likely help with the situation however the pseudomonas noted on culture is sensitive to Cipro. Also unfortunately patient's wound seems to have a large compared to last week's evaluation. Not severely so but there are definitely increased measurements in general. He is continuing to have discomfort as well he writes this to be a seven out of 10. In fact he would prefer me not to perform any debridement today due to the fact that he is having discomfort and considering he has an active infection on the little reluctant to do so anyway. No fevers, chills, nausea, or vomiting noted at this time. 01/08/17; patient seems dermatology on September 5. I suspect dermatology will want the slides from the biopsy I did sent to their pathologist. I'm not sure if there is a way we can expedite that. In any case the culture I did before I left on vacation 3 [name], [name] ([id number]) weeks ago showed Pseudomonas he was given 10 days of Cipro and per her description of her intake nurses is actually somewhat better this week although the wound is quite a bit bigger than I remember the last time I saw this. He still has 3 more days of Cipro 01/21/17; dermatology appointment tomorrow. He has completed the ciprofloxacin for Pseudomonas. Surface of the wound looks better however he is had some deterioration in the lesions on his right leg. Meantime the left lateral leg wound we will continue with sample 01/29/17; patient had his dermatology appointment but I can't yet see that note. He is completed his antibiotics. The wound is more superficial but considerably larger in circumferential area than when he came in. This is in his left lateral calf. He also has swollen erythematous areas with superficial wounds on the right leg and small papular areas on both arms. There apparently areas in her his upper thighs and buttocks I did not look at those. Dermatology biopsied the right leg. Hopefully will have their input next  week. 02/05/17; patient went back to see his dermatologist who told him that he had a "scratching problem" as well as staph. He is now on a 30 day course of doxycycline and I believe she gave him triamcinolone cream to the right leg areas to help with the itching [not exactly sure but probably triamcinolone]. She apparently looked at the left lateral leg wound although this was not rebiopsied and I think felt to be ultimately part of the same pathogenesis. He is using sample border foam and changing nevus himself. He now has a new open area on the right posterior leg which was his biopsy site I don't have any of the dermatology notes 02/12/17; we put the patient in compression last week with SANTYL to the wound on the left leg and the biopsy. Edema is much better and the depth of the wound is now at level of skin. Area is still the same oBiopsy site on the right lateral leg we've also been using santyl with a border foam dressing and he is changing this himself. 02/19/17; Using silver alginate started last week to both the substantial left leg wound and the biopsy site on the right wound.  He is tolerating compression well. Has a an appointment with his primary M.D. tomorrow wondering about diuretics although I'm wondering if the edema problem is actually lymphedema 02/26/17; the patient has been to see his primary doctor Dr. Jerelyn Charles at [place] our primary care. She started him on Lasix 20 mg and this seems to have helped with the edema. However we are not making substantial change with the left lateral calf wound and inflammation. The biopsy site on the right leg also looks stable but not really all that different. 03/12/17; the patient has been to see vein and vascular Dr. Wyn Quaker. He has had venous reflux studies I have not reviewed these. I did get a call from his dermatology office. They felt that he might have pathergy based on their biopsy on his right leg which led them to look at the slides  of the biopsy I did on the left leg and they wonder whether this represents pyoderma gangrenosum which was the original supposition in a man with ulcerative colitis albeit inactive for many years. They therefore recommended clobetasol and tetracycline i.e. 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 03/26/17; apparently the patient just had reflux studies not an appointment with Dr. dew. She arrives in clinic today having applied clobetasol for 2-3 weeks. He notes over the last 2-3 days excessive drainage having to change the dressing 3-4 times a day and also expanding erythema. He states the expanding erythema seems to come and go and was last this red was earlier in the month.he is on doxycycline 150 mg twice a day as an anti-inflammatory systemic therapy for possible pyoderma gangrenosum along with the topical clobetasol Electronic Signature(s) Signed: 03/26/2017 5:39:07 PM By: Baltazar Najjar MD Entered By: Baltazar Najjar on 03/26/2017 09:43:24 [name] ([id number]) -------------------------------------------------------------------------------- Physical Exam Details Patient Name: [name] Date of Service: 03/26/2017 8:15 AM Medical Record Number: [id number] Patient Account Number: [id number] Date of Birth/Sex: August 10, 1980 (36 y.o. Male) Treating RN: Huel Coventry Primary Care Provider: [name] Other Clinician: Referring Provider: [name] Treating Provider/Extender: Maxwell Caul Weeks in Treatment: 16 Constitutional Patient is hypertensive.. Pulse regular and within target range for patient.Marland Kitchen Respirations regular, non-labored and within target range.. Temperature is normal and within the target range for the patient.Marland Kitchen appears in no distress. Cardiovascular Pedal pulses palpable and strong bilaterally.. Notes wound exam; not anything good to say about the left lateral leg. The wound is clearly bigger by about a centimeter in diameter. He has a  tattoo of making Mickey Mouse at one point this wound only involved one of the feet of Mickey, now it is up to the mid abdomen. Irregular jagged wound with surface slough. The erythema is mostly medial and there is absolutely no tenderness making this very unusual for A "usual" usual cellulits. He does not appear to be systemically unwell. Using a #15 blade and pickups I removed part of the wound for deep tissue culture Electronic Signature(s) Signed: 03/26/2017 5:39:07 PM By: Baltazar Najjar MD Entered By: Baltazar Najjar on 03/26/2017 09:45:32 Hilligoss, [name] ([id number]) -------------------------------------------------------------------------------- Physician Orders Details Patient Name: [name] Date of Service: 03/26/2017 8:15 AM Medical Record Number: [id number] Patient Account Number: [id number] Date of Birth/Sex: 08-Mar-1981 (36 y.o. Male) Treating RN: Renne Crigler Primary Care Provider: [name] Other Clinician: Referring Provider: [name] Treating Provider/Extender: Altamese  in Treatment: 45 Verbal / Phone Orders: No Diagnosis Coding Wound Cleansing Wound #1 Left,Lateral Lower Leg o Clean wound with Normal Saline. Wound #2 Right,Posterior Lower Leg o  Clean wound with Normal Saline. Anesthetic Wound #1 Left,Lateral Lower Leg o Topical Lidocaine 4% cream applied to wound bed prior to debridement Wound #2 Right,Posterior Lower Leg o Topical Lidocaine 4% cream applied to wound bed prior to debridement Primary Wound Dressing Wound #1 Left,Lateral Lower Leg o Silvercel Non-Adherent Wound #2 Right,Posterior Lower Leg o Prisma Ag Secondary Dressing Wound #1 Left,Lateral Lower Leg o ABD pad o Boardered Foam Dressing o XtraSorb Dressing Change Frequency Wound #1 Left,Lateral Lower Leg o Change dressing every other day. Wound #2 Right,Posterior Lower Leg o Change dressing every other day. Follow-up Appointments Wound  #1 Left,Lateral Lower Leg o Return Appointment in 1 week. Wound #2 Right,Posterior Lower Leg o Return Appointment in 1 week. Edema Control Louvier, Finbar ([id number]) Wound #1 Left,Lateral Lower Leg o Kerlix and Coban - Left Lower Extremity o Elevate legs to the level of the heart and pump ankles as often as possible Wound #2 Right,Posterior Lower Leg o Kerlix and Coban - Left Lower Extremity o Elevate legs to the level of the heart and pump ankles as often as possible Medications-please add to medication list. Wound #1 Left,Lateral Lower Leg o P.O. Antibiotics - Continue Doxy Add Augmentin Wound #2 Right,Posterior Lower Leg o P.O. Antibiotics - Continue Doxy Add Augmentin Laboratory o Bacteria identified in Wound by Culture (MICRO) - Left lower leg oooo LOINC Code: 6462-6 oooo Convenience Name: Wound culture routine Patient Medications Allergies: mik, Biaxin, seasonal Notifications Medication Indication Start End Augmentin DOSE oral 875 mg-125 mg tablet - tablet oral Electronic Signature(s) Signed: 03/26/2017 4:19:52 PM By: Elliot GurneyWoody, [name], RN, [name], [name], [name] Signed: 03/26/2017 5:39:07 PM By: Baltazar Najjarobson, Solace Wendorff MD Entered By: Elliot GurneyWoody, [name], RN, [name], [name] on 03/26/2017 10:44:56 Laray AngerLAMER, Ricke ([id number]) -------------------------------------------------------------------------------- Prescription 03/26/2017 Patient Name: Laray AngerLAMER, Finch Provider: Maxwell CaulOBSON, Larcenia Holaday G MD Date of Birth: 04/29/1981 NPI#: [id number] Sex: M DEA#: [id number] Phone #: [phone] License #: [id number] Patient Address: [facility] [address] [facility] [place] [address] [address], [address] [place], [place] [address] [phone] Allergies mik Biaxin seasonal Medication Medication: Route: Strength: Form: Augmentin 875 mg-125 mg tablet oral 875 mg-125 mg tablet Class: PENICILLIN ANTIBIOTICS Dose: Frequency / Time:  Indication: tablet oral Number of Refills: Number of Units: 0 Generic Substitution: Start Date: End Date: One Time Use: Substitution Permitted No Note to Pharmacy: Signature(s): Date(s): Electronic Signature(s) Signed: 03/26/2017 4:19:52 PM By: Elliot GurneyWoody, [name], RN, [name], [name], [name] Signed: 03/26/2017 5:39:07 PM By: Baltazar Najjarobson, Candance Bohlman MD PointLAMER, Molly MaduroOBERT ([id number]) Entered By: Elliot GurneyWoody, [name], RN, [name], [name] on 03/26/2017 10:44:57 Laray AngerLAMER, Ernestine ([id number]) --------------------------------------------------------------------------------  Problem List Details Patient Name: Laray AngerLAMER, Yanis Date of Service: 03/26/2017 8:15 AM Medical Record Number: [id number] Patient Account Number: [id number] Date of Birth/Sex: 03/05/1981 [age] y.o. 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 Male) Treating RN: Huel CoventryWoody, [name] Primary Care Provider: Vernona RiegerLARK, KATHERINE Other Clinician: Referring  Provider: [name] Treating Provider/Extender: Altamese [place] in Treatment: 16 Subjective History of Present Illness (HPI) 12/04/16; 36 year old man who  comes into the clinic today for review of a wound on the posterior left calf. He tells me that is been there for about a year. He is not a diabetic he does smoke half a pack per day. He was seen in the ER on 11/20/16 felt to have cellulitis around the wound and was given clindamycin. An x-ray did not show osteomyelitis. The patient initially tells me that he has a milk allergy that sets off a pruritic itching rash on his lower legs which she scratches incessantly and he thinks that's what may have set up the wound. He has been using various topical antibiotics and ointments without any effect. He works in a [employer]ing [employer] and is on his feet all day. He does not have a prior history of wounds however he does have the rash on both lower legs the right arm and the ventral aspect of his left arm. These are excoriations and clearly have had scratching however there are of macular looking areas on both legs including a substantial larger area on the right leg. This does not have an underlying open area. There is no blistering. 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 He seemed to recognize this. 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 He is not in any treatment for this. He's had no recent travel 12/11/16; the patient arrives today with his wound and roughly the same condition we've been using silver alginate this is a deep punched out wound with some surrounding erythema but no tenderness. Biopsy I did did not show confirmed pyoderma gangrenosum suggested nonspecific inflammation and vasculitis but does not provide an actual description of what was seen by the pathologist. I'm really not able to understand this We have also received information from the patient's dermatologist in [place]  notes from April 2016. This was a doctor Agarwal- antal. 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 He was prescribed topical steroid high potency under occlusion which helped but at this point the patient did not have a deep punched out wound. 12/18/16; the patient's wound is larger in terms of surface area however this surface looks better and there is less depth. The surrounding erythema also is better. The patient states that the wrap we put on came off 2 days ago when he has been using his compression stockings. He we are in the process of getting a dermatology consult. 12/26/16 on evaluation today patient's left lower extremity wound shows evidence of infection with surrounding erythema noted. He has been tolerating the dressing changes but states that he has noted more discomfort. There is a larger area of erythema surrounding the wound. No fevers, chills, nausea, or vomiting noted at this time. With that being said the wound still does have slough covering the surface. He is not allergic to any medication that he is aware of at this point. In regard to his right lower extremity he had several regions that are erythematous and pruritic he wonders if there's anything we can do to help that. 01/02/17 I reviewed patient's wound culture which was obtained his visit last week. He was placed on  doxycycline at that point. Unfortunately that does not appear to be an antibiotic that would likely help with the situation however the pseudomonas noted on culture is sensitive to Cipro. Also unfortunately patient's wound seems to have a large compared to last week's evaluation. Not severely so but there are definitely increased measurements in general. He is continuing to have discomfort as well he writes this to be a seven out of 10. In fact he would prefer me not to perform any debridement today due to the fact that he is having discomfort and considering he has an active infection on the  little reluctant to do so anyway. No fevers, chills, nausea, or vomiting noted at this time. 01/08/17; patient seems dermatology on September 5. I suspect dermatology will want the slides from the biopsy I did sent to their pathologist. I'm not sure if there is a way we can expedite that. In any case the culture I did before I left on vacation 3 weeks ago showed Pseudomonas he was given 10 days of Cipro and per her description of her intake nurses is actually somewhat better this week although the wound is quite a bit bigger than I remember the last time I saw this. He still has 3 Woldt, Jahzir ([id number]) more days of Cipro 01/21/17; dermatology appointment tomorrow. He has completed the ciprofloxacin for Pseudomonas. Surface of the wound looks better however he is had some deterioration in the lesions on his right leg. Meantime the left lateral leg wound we will continue with sample 01/29/17; patient had his dermatology appointment but I can't yet see that note. He is completed his antibiotics. The wound is more superficial but considerably larger in circumferential area than when he came in. This is in his left lateral calf. He also has swollen erythematous areas with superficial wounds on the right leg and small papular areas on both arms. There apparently areas in her his upper thighs and buttocks I did not look at those. Dermatology biopsied the right leg. Hopefully will have their input next week. 02/05/17; patient went back to see his dermatologist who told him that he had a "scratching problem" as well as staph. He is now on a 30 day course of doxycycline and I believe she gave him triamcinolone cream to the right leg areas to help with the itching [not exactly sure but probably triamcinolone]. She apparently looked at the left lateral leg wound although this was not rebiopsied and I think felt to be ultimately part of the same pathogenesis. He is using sample border foam and changing nevus  himself. He now has a new open area on the right posterior leg which was his biopsy site I don't have any of the dermatology notes 02/12/17; we put the patient in compression last week with SANTYL to the wound on the left leg and the biopsy. Edema is much better and the depth of the wound is now at level of skin. Area is still the same Biopsy site on the right lateral leg we've also been using santyl with a border foam dressing and he is changing this himself. 02/19/17; Using silver alginate started last week to both the substantial left leg wound and the biopsy site on the right wound. He is tolerating compression well. Has a an appointment with his primary M.D. tomorrow wondering about diuretics although I'm wondering if the edema problem is actually lymphedema 02/26/17; the patient has been to see his primary doctor [place].  She started him on Lasix 20 mg and this seems to have helped with the edema. However we are not making substantial change with the left lateral calf wound and inflammation. The biopsy site on the right leg also looks stable but not really all that different. 03/12/17; the patient has been to see vein and vascular Dr. Wyn Quaker. He has had venous reflux studies I have not reviewed these. I did get a call from his dermatology office. 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 They therefore recommended clobetasol and tetracycline i.e. 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 03/26/17; apparently the patient just had reflux studies not an appointment with Dr. dew. She arrives in clinic today having applied clobetasol for 2-3 weeks. He notes over the last 2-3 days excessive  drainage having to change the dressing 3-4 times a day and also expanding erythema. He states the expanding erythema seems to come and go and was last this red was earlier in the month.he is on doxycycline 150 mg twice a day as an anti-inflammatory systemic therapy for possible pyoderma gangrenosum along with the topical clobetasol Objective Constitutional Patient is hypertensive.. Pulse regular and within target range for patient.Marland Kitchen Respirations regular, non-labored and within target range.. Temperature is normal and within the target range for the patient.Marland Kitchen appears in no distress. Vitals Time Taken: 8:27 AM, Height: 71 in, Weight: 338 lbs, BMI: 47.1, Temperature: 98.4 F, Pulse: 99 bpm, Respiratory Rate: 18 breaths/min, Blood Pressure: 168/94 mmHg. Cardiovascular Pedal pulses palpable and strong bilaterally.. General Notes: wound exam; not anything good to say about the left lateral leg. The wound is clearly bigger by about a Kocsis, Ethen ([id number]) centimeter in diameter. He has a tattoo of making Mickey Mouse at one point this wound only involved one of the feet of Mickey, now it is up to the mid abdomen. Irregular jagged wound with surface slough. The erythema is mostly medial and there is absolutely no tenderness making this very unusual for A "usual" usual cellulits. He does not appear to be systemically unwell. Using a #15 blade and pickups I removed part of the wound for deep tissue culture Integumentary (Hair, Skin) Wound #1 status is Open. Original cause of wound was Gradually Appeared. The wound is located on the Left,Lateral Lower Leg. The wound measures 5.7cm length x 5.3cm width x 0.3cm depth; 23.727cm^2 area and 7.118cm^3 volume. There is Fat Layer (Subcutaneous Tissue) Exposed exposed. There is no tunneling or undermining noted. There is a large amount of serosanguineous drainage noted. Foul odor after cleansing was noted. The wound margin is distinct with the outline  attached to the wound base. There is small (1-33%) red granulation within the wound bed. There is a large (67-100%) amount of necrotic tissue within the wound bed including Adherent Slough. The periwound skin appearance exhibited: Induration, Erythema. The periwound skin appearance did not exhibit: Callus, Crepitus, Excoriation, Rash, Scarring, Dry/Scaly, Maceration, Atrophie Blanche, Cyanosis, Ecchymosis, Hemosiderin Staining, Mottled, Pallor, Rubor. The surrounding wound skin color is noted with erythema which is circumferential. Periwound temperature was noted as No Abnormality. The periwound has tenderness on palpation. Wound #2 status is Open. Original cause of wound was Surgical Injury. The wound is located on the Right,Posterior Lower Leg.  The wound measures 1.4cm length x 1.4cm width x 0.3cm depth; 1.539cm^2 area and 0.462cm^3 volume. There is Fat Layer (Subcutaneous Tissue) Exposed exposed. There is no tunneling or undermining noted. There is a medium amount of serosanguineous drainage noted. Foul odor after cleansing was noted. The wound margin is distinct with the outline attached to the wound base. There is medium (34-66%) red granulation within the wound bed. There is a medium (34-66%) amount of necrotic tissue within the wound bed including Adherent Slough. The periwound skin appearance exhibited: Erythema. The periwound skin appearance did not exhibit: Callus, Crepitus, Excoriation, Induration, Rash, Scarring, Dry/Scaly, Maceration, Atrophie Blanche, Cyanosis, Ecchymosis, Hemosiderin Staining, Mottled, Pallor, Rubor. The surrounding wound skin color is noted with erythema which is circumferential. Periwound temperature was noted as No Abnormality. The periwound has tenderness on palpation. 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 Wound #2 Right,Posterior Lower Leg: Clean wound with Normal Saline. Anesthetic: Wound #1 Left,Lateral Lower Leg: Topical Lidocaine 4% cream applied to wound bed prior to debridement Wound #2 Right,Posterior Lower Leg: Topical Lidocaine 4% cream applied to wound bed prior to debridement Primary Wound Dressing: QUINNTIN, MALTER ([id number]) Wound #1 Left,Lateral Lower Leg: Silvercel Non-Adherent Wound #2 Right,Posterior Lower Leg: Prisma Ag Secondary Dressing: Wound #1 Left,Lateral Lower Leg: XtraSorb Boardered Foam Dressing ABD pad Dressing Change Frequency: Wound #1 Left,Lateral Lower Leg: Change dressing every other day. Wound #2 Right,Posterior Lower Leg: Change dressing every other day. Follow-up Appointments: Wound #1 Left,Lateral Lower Leg: Return Appointment in 1 week. Wound #2 Right,Posterior Lower Leg: Return Appointment in 1 week. Edema Control: Wound #1 Left,Lateral Lower Leg: Kerlix and Coban - Left Lower Extremity Elevate legs to the level of the heart and pump ankles as often as possible Wound #2 Right,Posterior Lower Leg: Kerlix and Coban - Left Lower Extremity Elevate legs to the level of the heart and pump ankles as often as possible Medications-please add to medication list.: Wound #1 Left,Lateral Lower Leg: P.O. Antibiotics - Continue Doxy Add Augmentin Wound #2 Right,Posterior Lower Leg: P.O. Antibiotics - Continue Doxy Add Augmentin The following medication(s) was prescribed: Augmentin oral 875 mg-125 mg tablet tablet oral #1 I have added Augmentin in addition to the doxycycline at a dose of 875 twice a day for one week. Broader spectrum coverage as well as anaerobes #2 I have attempted to put in a call to dermatology in order to try to get him seen. I think this wound needs to be rebiopsied. Clearly current treatment is not resulting in improvement in  fact the wound is larger. #3 obviously the surrounding erythema extending medially has me concerned although I don't believe this is a usual cellulitis there is no tenderness. This could be inflammation or atypical infection Electronic Signature(s) Signed: 03/26/2017 5:39:07 PM By: Baltazar Najjar MD Entered By: Baltazar Najjar on 03/26/2017 09:47:28 [name] ([id number]) -------------------------------------------------------------------------------- SuperBill Details Patient Name: [name] Date of Service: 03/26/2017 Medical Record Number: [id number] Patient Account Number: [id number] Date of Birth/Sex: 1980-06-18 (36 y.o. Male) Treating RN: Renne Crigler Primary Care Provider: [name] Other Clinician: Referring Provider: [name] Treating Provider/Extender: [place] in Treatment: 16 Diagnosis Coding ICD-10 Codes Code Description 9043258572 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sis of muscle I87.2 Venous insufficiency (chronic) (peripheral) L88 Pyoderma gangrenosum Facility Procedures CPT4 Code: [id number] Description:  57846 - WOUND CARE VISIT-LEV 3 EST PT Modifier: Quantity: 1 Physician Procedures CPT4 Code: [id number] Description: 99213 - WC PHYS LEVEL 3 - EST PT ICD-10 Diagnosis Description L97.223 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sis of m I87.2 Venous insufficiency (chronic) (peripheral) L88 Pyoderma gangrenosum Modifier: uscle Quantity: 1 Electronic Signature(s) Signed: 03/26/2017 5:39:07 PM By: Baltazar Najjar MD Entered By: Baltazar Najjar on 03/26/2017 09:48:03

## 2017-03-28 NOTE — Progress Notes (Signed)
[name], BECKSTEAD ([id number]) Visit Report for 03/26/2017 Arrival Information Details Patient Name: [name], [name] Date of Service: 03/26/2017 8:15 AM Medical Record Number: [id number] Patient Account Number: [id number] Date of Birth/Sex: 01/10/1981 (36 y.o. Male) Treating RN: Roger Shelter Primary Care Yumiko Alkins: [name] Other Clinician: Referring Jacquelynn Friend: [name] Treating Milta Croson/Extender: Tito Dine in Treatment: 55 Visit Information History Since Last Visit All ordered tests and consults were completed: No Patient Arrived: Ambulatory Added or deleted any medications: No Arrival Time: 08:22 Any new allergies or adverse reactions: No Accompanied By: self Had a fall or experienced change in No Transfer Assistance: None activities of daily living that may affect Patient Identification Verified: Yes risk of falls: Secondary Verification Process Completed: Yes Signs or symptoms of abuse/neglect since last visito No Patient Requires Transmission-Based No Pain Present Now: Yes Precautions: Patient Has Alerts: No Electronic Signature(s) Signed: 03/26/2017 4:21:40 PM By: Roger Shelter Entered By: Roger Shelter on 03/26/2017 08:26:59 [name] ([id number]) -------------------------------------------------------------------------------- Clinic Level of Care Assessment Details Patient Name: [name] Date of Service: 03/26/2017 8:15 AM Medical Record Number: [id number] Patient Account Number: [id number] Date of Birth/Sex: 1980/09/17 (36 y.o. Male) Treating RN: Roger Shelter Primary Care Heavan Francom: [name] Other Clinician: Referring Devonne Kitchen: [name] Treating Kayline Sheer/Extender: Tito Dine in Treatment: 16 Clinic Level of Care Assessment Items TOOL 4 Quantity Score []  - Use when only an EandM is performed on FOLLOW-UP visit 0 ASSESSMENTS - Nursing Assessment / Reassessment []  - Reassessment of Co-morbidities  (includes updates in patient status) 0 X- 1 5 Reassessment of Adherence to Treatment Plan ASSESSMENTS - Wound and Skin Assessment / Reassessment X - Simple Wound Assessment / Reassessment - one wound 1 5 []  - 0 Complex Wound Assessment / Reassessment - multiple wounds []  - 0 Dermatologic / Skin Assessment (not related to wound area) ASSESSMENTS - Focused Assessment []  - Circumferential Edema Measurements - multi extremities 0 []  - 0 Nutritional Assessment / Counseling / Intervention []  - 0 Lower Extremity Assessment (monofilament, tuning fork, pulses) []  - 0 Peripheral Arterial Disease Assessment (using hand held doppler) ASSESSMENTS - Ostomy and/or Continence Assessment and Care []  - Incontinence Assessment and Management 0 []  - 0 Ostomy Care Assessment and Management (repouching, etc.) PROCESS - Coordination of Care X - Simple Patient / Family Education for ongoing care 1 15 []  - 0 Complex (extensive) Patient / Family Education for ongoing care X- 1 10 Staff obtains Programmer, systems, Records, Test Results / Process Orders []  - 0 Staff telephones HHA, Nursing Homes / Clarify orders / etc []  - 0 Routine Transfer to another Facility (non-emergent condition) []  - 0 Routine Hospital Admission (non-emergent condition) []  - 0 New Admissions / Biomedical engineer / Ordering NPWT, Apligraf, etc. []  - 0 Emergency Hospital Admission (emergent condition) X- 1 10 Simple Discharge Coordination Wolven, Kaleel ([id number]) []  - 0 Complex (extensive) Discharge Coordination PROCESS - Special Needs []  - Pediatric / Minor Patient Management 0 []  - 0 Isolation Patient Management []  - 0 Hearing / Language / Visual special needs []  - 0 Assessment of Community assistance (transportation, D/C planning, etc.) []  - 0 Additional assistance / Altered mentation []  - 0 Support Surface(s) Assessment (bed, cushion, seat, etc.) INTERVENTIONS - Wound Cleansing / Measurement []  - Simple Wound  Cleansing - one wound 0 X- 2 5 Complex Wound Cleansing - multiple wounds []  - 0 Wound Imaging (photographs - any number of wounds) []  - 0 Wound Tracing (instead of photographs) []  - 0 Simple Wound Measurement -  one wound X- 2 5 Complex Wound Measurement - multiple wounds INTERVENTIONS - Wound Dressings []  - Small Wound Dressing one or multiple wounds 0 X- 1 15 Medium Wound Dressing one or multiple wounds []  - 0 Large Wound Dressing one or multiple wounds []  - 0 Application of Medications - topical []  - 0 Application of Medications - injection INTERVENTIONS - Miscellaneous []  - External ear exam 0 []  - 0 Specimen Collection (cultures, biopsies, blood, body fluids, etc.) []  - 0 Specimen(s) / Culture(s) sent or taken to Lab for analysis []  - 0 Patient Transfer (multiple staff / Civil Service fast streamer / Similar devices) []  - 0 Simple Staple / Suture removal (25 or less) []  - 0 Complex Staple / Suture removal (26 or more) []  - 0 Hypo / Hyperglycemic Management (close monitor of Blood Glucose) []  - 0 Ankle / Brachial Index (ABI) - do not check if billed separately X- 1 5 Vital Signs Volner, Jerimy ([id number]) Has the patient been seen at the hospital within the last three years: Yes Total Score: 85 Level Of Care: New/Established - Level 3 Electronic Signature(s) Signed: 03/26/2017 4:21:40 PM By: Roger Shelter Entered By: Roger Shelter on 03/26/2017 09:37:13 Steen, [name] ([id number]) -------------------------------------------------------------------------------- Encounter Discharge Information Details Patient Name: [name] Date of Service: 03/26/2017 8:15 AM Medical Record Number: [id number] Patient Account Number: [id number] Date of Birth/Sex: 07-19-1980 (36 y.o. Male) Treating RN: Roger Shelter Primary Care Daaiel Starlin: [name] Other Clinician: Referring Mack Alvidrez: [name] Treating Damione Robideau/Extender: Tito Dine in Treatment: 16 Encounter  Discharge Information Items Discharge Pain Level: 0 Discharge Condition: Stable Ambulatory Status: Ambulatory Discharge Destination: Home Transportation: Private Auto Accompanied By: self Schedule Follow-up Appointment: Yes Medication Reconciliation completed and Yes provided to Patient/Care Kataleia Quaranta: Provided on Clinical Summary of Care: 03/26/2017 Form Type Recipient Paper Patient RL Electronic Signature(s) Signed: 03/27/2017 10:05:11 AM By: Ruthine Dose Entered By: Ruthine Dose on 03/26/2017 09:45:51 [place] ([id number]) -------------------------------------------------------------------------------- Lower Extremity Assessment Details Patient Name: [name] Date of Service: 03/26/2017 8:15 AM Medical Record Number: [id number] Patient Account Number: [id number] Date of Birth/Sex: 04-Nov-1980 (36 y.o. Male) Treating RN: Roger Shelter Primary Care Ayden Hardwick: [name] Other Clinician: Referring Gesenia Bantz: [name] Treating Cearra Portnoy/Extender: Ricard Dillon Weeks in Treatment: 16 Edema Assessment Assessed: [Left: No] [Right: No] Edema: [Left: No] [Right: No] Vascular Assessment Claudication: Claudication Assessment [Left:Intermittent] [Right:Intermittent] Pulses: Dorsalis Pedis Palpable: [Left:Yes] [Right:Yes] Posterior Tibial Extremity colors, hair growth, and conditions: Extremity Color: [Left:Red] [Right:Normal] Hair Growth on Extremity: [Left:Yes] [Right:Yes] Temperature of Extremity: [Left:Warm] [Right:Warm] Capillary Refill: [Left:< 3 seconds] [Right:< 3 seconds] Toe Nail Assessment Left: Right: Thick: No No Discolored: No No Deformed: No No Improper Length and Hygiene: No No Electronic Signature(s) Signed: 03/26/2017 4:21:40 PM By: Roger Shelter Entered By: Roger Shelter on 03/26/2017 08:44:46 Rabel, [name] ([id number]) -------------------------------------------------------------------------------- Multi Wound Chart  Details Patient Name: [name] Date of Service: 03/26/2017 8:15 AM Medical Record Number: [id number] Patient Account Number: [id number] Date of Birth/Sex: 1981-04-06 (36 y.o. Male) Treating RN: Roger Shelter Primary Care France Lusty: [name] Other Clinician: Referring Kaida Games: [name] Treating Trey Bebee/Extender: Ricard Dillon Weeks in Treatment: 16 Vital Signs Height(in): 71 Pulse(bpm): 99 Weight(lbs): 338 Blood Pressure(mmHg): 168/94 Body Mass Index(BMI): 47 Temperature(F): 98.4 Respiratory Rate 18 (breaths/min): Photos: [N/A:N/A] Wound Location: Left Lower Leg - Lateral Right Lower Leg - Posterior N/A Wounding Event: Gradually Appeared Surgical Injury N/A Primary Etiology: Vasculitis Open Surgical Wound N/A Comorbid History: Sleep Apnea, Hypertension, Sleep Apnea, Hypertension, N/A Colitis Colitis Date Acquired: 11/18/2015 02/02/2017 N/A  Weeks of Treatment: 16 7 N/A Wound Status: Open Open N/A Measurements L x W x D 5.7x5.3x0.3 1.4x1.4x0.3 N/A (cm) Area (cm) : 23.727 1.539 N/A Volume (cm) : 7.118 0.462 N/A % Reduction in Area: -383.30% -210.90% N/A % Reduction in Volume: -81.30% -366.70% N/A Classification: Full Thickness Without Partial Thickness N/A Exposed Support Structures Exudate Amount: Large Medium N/A Exudate Type: Serosanguineous Serosanguineous N/A Exudate Color: red, brown red, brown N/A Foul Odor After Cleansing: Yes Yes N/A Odor Anticipated Due to No No N/A Product Use: Wound Margin: Distinct, outline attached Distinct, outline attached N/A Granulation Amount: Small (1-33%) Medium (34-66%) N/A Granulation Quality: Red Red N/A Necrotic Amount: Large (67-100%) Medium (34-66%) N/A Exposed Structures: Fat Layer (Subcutaneous Fat Layer (Subcutaneous N/A Tissue) Exposed: Yes Tissue) Exposed: Yes Fascia: No Tendon: No Muscle: No Cecere, Karlos ([id number]) Joint: No Bone: No Epithelialization: None None N/A Periwound Skin  Texture: Induration: Yes Excoriation: No N/A Excoriation: No Induration: No Callus: No Callus: No Crepitus: No Crepitus: No Rash: No Rash: No Scarring: No Scarring: No Periwound Skin Moisture: Maceration: No Maceration: No N/A Dry/Scaly: No Dry/Scaly: No Periwound Skin Color: Erythema: Yes Erythema: Yes N/A Atrophie Blanche: No Atrophie Blanche: No Cyanosis: No Cyanosis: No Ecchymosis: No Ecchymosis: No Hemosiderin Staining: No Hemosiderin Staining: No Mottled: No Mottled: No Pallor: No Pallor: No Rubor: No Rubor: No Erythema Location: Circumferential Circumferential N/A Erythema Change: Increased N/A N/A Temperature: No Abnormality No Abnormality N/A Tenderness on Palpation: Yes Yes N/A Wound Preparation: Ulcer Cleansing: Ulcer Cleansing: N/A Rinsed/Irrigated with Saline Rinsed/Irrigated with Saline Topical Anesthetic Applied: Topical Anesthetic Applied: None, Other: Lidocaine 4% None, Other: Lidocaine 4% Treatment Notes Wound #1 (Left, Lateral Lower Leg) 1. Cleansed with: Clean wound with Normal Saline 2. Anesthetic Topical Lidocaine 4% cream to wound bed prior to debridement 4. Dressing Applied: Other dressing (specify in notes) 5. Secondary Dressing Applied ABD Pad 7. Secured with Other (specify in notes) Notes Kerlix and Event organiser) Signed: 03/26/2017 5:39:07 PM By: Linton Ham MD Entered By: Linton Ham on 03/26/2017 [place], [place]y ([id number]) -------------------------------------------------------------------------------- Multi-Disciplinary Care Plan Details Patient Name: [name] Date of Service: 03/26/2017 8:15 AM Medical Record Number: [id number] Patient Account Number: [id number] Date of Birth/Sex: 23-Oct-1980 (36 y.o. Male) Treating RN: Roger Shelter Primary Care Kimsey Demaree: [name] Other Clinician: Referring Ellenie Salome: [name] Treating Skyy Mcknight/Extender: Tito Dine in  Treatment: 16 Active Inactive ` Orientation to the Wound Care Program Nursing Diagnoses: Knowledge deficit related to the wound healing center program Goals: Patient/caregiver will verbalize understanding of the [place] Program Date Initiated: 12/11/2016 Target Resolution Date: 03/13/2017 Goal Status: Active Interventions: Provide education on orientation to the wound center Notes: ` Venous Leg Ulcer Nursing Diagnoses: Knowledge deficit related to disease process and management Potential for venous Insuffiency (use before diagnosis confirmed) Goals: Non-invasive venous studies are completed as ordered Date Initiated: 12/11/2016 Target Resolution Date: 03/13/2017 Goal Status: Active Patient will maintain optimal edema control Date Initiated: 12/11/2016 Target Resolution Date: 03/13/2017 Goal Status: Active Patient/caregiver will verbalize understanding of disease process and disease management Date Initiated: 12/11/2016 Target Resolution Date: 03/13/2017 Goal Status: Active Verify adequate tissue perfusion prior to therapeutic compression application Date Initiated: 12/11/2016 Target Resolution Date: 03/13/2017 Goal Status: Active Interventions: Assess peripheral edema status every visit. Compression as ordered Provide education on venous insufficiency Treatment Activities: FERLIN, FAIRHURST ([id number]) Therapeutic compression applied : 12/11/2016 Notes: ` Wound/Skin Impairment Nursing Diagnoses: Impaired tissue integrity Knowledge deficit related to ulceration/compromised skin integrity Goals: Patient/caregiver will verbalize understanding  of skin care regimen Date Initiated: 12/11/2016 Target Resolution Date: 03/13/2017 Goal Status: Active Ulcer/skin breakdown will have a volume reduction of 30% by week 4 Date Initiated: 12/11/2016 Target Resolution Date: 03/13/2017 Goal Status: Active Ulcer/skin breakdown will have a volume reduction of 50% by week 8 Date  Initiated: 12/11/2016 Target Resolution Date: 03/13/2017 Goal Status: Active Ulcer/skin breakdown will have a volume reduction of 80% by week 12 Date Initiated: 12/11/2016 Target Resolution Date: 03/13/2017 Goal Status: Active Ulcer/skin breakdown will heal within 14 weeks Date Initiated: 12/11/2016 Target Resolution Date: 03/13/2017 Goal Status: Active Interventions: Assess patient/caregiver ability to obtain necessary supplies Assess patient/caregiver ability to perform ulcer/skin care regimen upon admission and as needed Assess ulceration(s) every visit Provide education on ulcer and skin care Treatment Activities: Skin care regimen initiated : 12/11/2016 Topical wound management initiated : 12/11/2016 Notes: Electronic Signature(s) Signed: 03/26/2017 4:21:40 PM By: Roger Shelter Entered By: Roger Shelter on 03/26/2017 09:03:27 [name] ([id number]) -------------------------------------------------------------------------------- Pain Assessment Details Patient Name: [name] Date of Service: 03/26/2017 8:15 AM Medical Record Number: [id number] Patient Account Number: [id number] Date of Birth/Sex: 04/25/1981 (36 y.o. Male) Treating RN: Roger Shelter Primary Care Idus Rathke: [name] Other Clinician: Referring Linnae Rasool: [name] Treating Linsy Ehresman/Extender: Ricard Dillon Weeks in Treatment: 16 Active Problems Location of Pain Severity and Description of Pain Patient Has Paino No Site Locations Duration of the Pain. Constant / Intermittento Intermittent Rate the pain. Current Pain Level: 4 Pain Management and Medication Current Pain Management: Medication: No Cold Application: No Rest: No Massage: No Activity: No T.E.N.S.: No Heat Application: No Leg drop or elevation: No Is the Current Pain Management Adequate: Inadequate Electronic Signature(s) Signed: 03/26/2017 4:21:40 PM By: Roger Shelter Entered By: Roger Shelter on  03/26/2017 08:27:40 [name] ([id number]) -------------------------------------------------------------------------------- Patient/Caregiver Education Details Patient Name: [name] Date of Service: 03/26/2017 8:15 AM Medical Record Number: [id number] Patient Account Number: [id number] Date of Birth/Gender: [birth year] (36 y.o. Male) Treating RN: Roger Shelter Primary Care Physician: [name] Other Clinician: Referring Physician: [name] Treating Physician/Extender: Tito Dine in Treatment: 16 Education Assessment Education Provided To: Patient Education Topics Provided Venous: Handouts: Controlling Swelling with Compression Stockings Methods: Demonstration, Explain/Verbal Responses: State content correctly Wound/Skin Impairment: Handouts: Caring for Your Ulcer Methods: Demonstration, Explain/Verbal Electronic Signature(s) Signed: 03/26/2017 4:21:40 PM By: Roger Shelter Entered By: Roger Shelter on 03/26/2017 09:40:56 Adame[place], [name] ([id number]) -------------------------------------------------------------------------------- Wound Assessment Details Patient Name: [name] Date of Service: 03/26/2017 8:15 AM Medical Record Number: [id number] Patient Account Number: [id number] Date of Birth/Sex: September 13, 1980 (36 y.o. Male) Treating RN: Roger Shelter Primary Care Catera Hankins: [name] Other Clinician: Referring Deisy Ozbun: [name] Treating Amman Bartel/Extender: Ricard Dillon Weeks in Treatment: 16 Wound Status Wound Number: 1 Primary Etiology: Vasculitis Wound Location: Left Lower Leg - Lateral Wound Status: Open Wounding Event: Gradually Appeared Comorbid History: Sleep Apnea, Hypertension, Colitis Date Acquired: 11/18/2015 Weeks Of Treatment: 16 Clustered Wound: No Photos Wound Measurements Length: (cm) 5.7 Width: (cm) 5.3 Depth: (cm) 0.3 Area: (cm) 23.727 Volume: (cm) 7.118 % Reduction in Area: -383.3% %  Reduction in Volume: -81.3% Epithelialization: None Tunneling: No Undermining: No Wound Description Full Thickness Without Exposed Support Classification: Structures Wound Margin: Distinct, outline attached Exudate Large Amount: Exudate Type: Serosanguineous Exudate Color: red, brown Foul Odor After Cleansing: Yes Due to Product Use: No Slough/Fibrino Yes Wound Bed Granulation Amount: Small (1-33%) Exposed Structure Granulation Quality: Red Fat Layer (Subcutaneous Tissue) Exposed: Yes Necrotic Amount: Large (67-100%) Necrotic Quality: Adherent Slough Periwound Skin Texture Texture Color  No Abnormalities Noted: No No Abnormalities Noted: No Callus: No Atrophie Blanche: No Crepitus: No Cyanosis: No Excoriation: No Ecchymosis: No Mowers, Dashon ([id number]) Induration: Yes Erythema: Yes Rash: No Erythema Location: Circumferential Scarring: No Erythema Change: Increased Hemosiderin Staining: No Moisture Mottled: No No Abnormalities Noted: No Pallor: No Dry / Scaly: No Rubor: No Maceration: No Temperature / Pain Temperature: No Abnormality Tenderness on Palpation: Yes Wound Preparation Ulcer Cleansing: Rinsed/Irrigated with Saline Topical Anesthetic Applied: None, Other: Lidocaine 4%, Treatment Notes Wound #1 (Left, Lateral Lower Leg) 1. Cleansed with: Clean wound with Normal Saline 2. Anesthetic Topical Lidocaine 4% cream to wound bed prior to debridement 4. Dressing Applied: Other dressing (specify in notes) 5. Secondary Dressing Applied ABD Pad 7. Secured with Other (specify in notes) Notes Kerlix and Event organiser) Signed: 03/26/2017 4:21:40 PM By: Roger Shelter Entered By: Roger Shelter on 03/26/2017 08:40:42 Windt, [name] ([id number]) -------------------------------------------------------------------------------- Wound Assessment Details Patient Name: [name] Date of Service: 03/26/2017 8:15 AM Medical Record Number:  [id number] Patient Account Number: [id number] Date of Birth/Sex: Nov 08, 1980 (36 y.o. Male) Treating RN: Roger Shelter Primary Care Juliane Guest: [name] Other Clinician: Referring Davarius Ridener: [name] Treating Amparo Donalson/Extender: Ricard Dillon Weeks in Treatment: 16 Wound Status Wound Number: 2 Primary Etiology: Open Surgical Wound Wound Location: Right Lower Leg - Posterior Wound Status: Open Wounding Event: Surgical Injury Comorbid History: Sleep Apnea, Hypertension, Colitis Date Acquired: 02/02/2017 Weeks Of Treatment: 7 Clustered Wound: No Photos Wound Measurements Length: (cm) 1.4 Width: (cm) 1.4 Depth: (cm) 0.3 Area: (cm) 1.539 Volume: (cm) 0.462 % Reduction in Area: -210.9% % Reduction in Volume: -366.7% Epithelialization: None Tunneling: No Undermining: No Wound Description Classification: Partial Thickness Wound Margin: Distinct, outline attached Exudate Amount: Medium Exudate Type: Serosanguineous Exudate Color: red, brown Foul Odor After Cleansing: Yes Due to Product Use: No Slough/Fibrino Yes Wound Bed Granulation Amount: Medium (34-66%) Exposed Structure Granulation Quality: Red Fascia Exposed: No Necrotic Amount: Medium (34-66%) Fat Layer (Subcutaneous Tissue) Exposed: Yes Necrotic Quality: Adherent Slough Tendon Exposed: No Muscle Exposed: No Joint Exposed: No Bone Exposed: No Periwound Skin Texture Texture Color No Abnormalities Noted: No No Abnormalities Noted: No Callus: No Atrophie Blanche: No Crepitus: No Cyanosis: No Gunnarson, Trumaine ([id number]) Excoriation: No Ecchymosis: No Induration: No Erythema: Yes Rash: No Erythema Location: Circumferential Scarring: No Hemosiderin Staining: No Mottled: No Moisture Pallor: No No Abnormalities Noted: No Rubor: No Dry / Scaly: No Maceration: No Temperature / Pain Temperature: No Abnormality Tenderness on Palpation: Yes Wound Preparation Ulcer Cleansing:  Rinsed/Irrigated with Saline Topical Anesthetic Applied: None, Other: Lidocaine 4%, Treatment Notes Wound #2 (Right, Posterior Lower Leg) 1. Cleansed with: Clean wound with Normal Saline 2. Anesthetic Topical Lidocaine 4% cream to wound bed prior to debridement 3. Peri-wound Care: Skin Prep 4. Dressing Applied: Prisma Ag 5. Secondary Dressing Applied Bordered Foam Dressing Notes Kerlix and [place] Electronic Signature(s) Signed: 03/26/2017 4:21:40 PM By: Roger Shelter Entered By: Roger Shelter on 03/26/2017 08:43:15 [name] ([id number]) -------------------------------------------------------------------------------- [place] Details Patient Name: [name] Date of Service: 03/26/2017 8:15 AM Medical Record Number: [id number] Patient Account Number: [id number] Date of Birth/Sex: 10-25-1980 (36 y.o. Male) Treating RN: Roger Shelter Primary Care Candice Lunney: [name] Other Clinician: Referring Tarquin Welcher: [name] Treating Amonda Brillhart/Extender: Tito Dine in Treatment: 16 Vital Signs Time Taken: 08:27 Temperature (F): 98.4 Height (in): 71 Pulse (bpm): 99 Weight (lbs): 338 Respiratory Rate (breaths/min): 18 Body Mass Index (BMI): 47.1 Blood Pressure (mmHg): 168/94 Reference Range: 80 - 120 mg / dl Electronic Signature(s) Signed:  03/26/2017 4:21:40 PM By: Roger Shelter Entered By: Roger Shelter on 03/26/2017 15:87:27

## 2017-03-30 LAB — AEROBIC CULTURE W GRAM STAIN (SUPERFICIAL SPECIMEN)

## 2017-04-01 ENCOUNTER — Ambulatory Visit
Admission: RE | Admit: 2017-04-01 | Discharge: 2017-04-01 | Payer: Commercial Managed Care - PPO | Attending: Dermatology | Admitting: Dermatology

## 2017-04-01 DIAGNOSIS — L88 Pyoderma gangrenosum: Principal | ICD-10-CM

## 2017-04-01 MED ORDER — PREDNISONE 20 MG TABLET
ORAL_TABLET | Freq: Every day | ORAL | 1 refills | 0.00000 days | Status: CP
Start: 2017-04-01 — End: 2017-05-06

## 2017-04-02 ENCOUNTER — Encounter: Payer: 59 | Admitting: Internal Medicine

## 2017-04-02 DIAGNOSIS — T8189XA Other complications of procedures, not elsewhere classified, initial encounter: Secondary | ICD-10-CM | POA: Diagnosis not present

## 2017-04-02 DIAGNOSIS — S81802A Unspecified open wound, left lower leg, initial encounter: Secondary | ICD-10-CM | POA: Diagnosis not present

## 2017-04-02 DIAGNOSIS — L97223 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sis of muscle: Secondary | ICD-10-CM | POA: Diagnosis not present

## 2017-04-02 DIAGNOSIS — B965 Pseudomonas (aeruginosa) (mallei) (pseudomallei) as the cause of diseases classified elsewhere: Secondary | ICD-10-CM | POA: Diagnosis not present

## 2017-04-03 NOTE — Progress Notes (Signed)
[name], [name] ([id number]) Visit Report for 04/02/2017 Arrival Information Details Patient Name: [name], [name] Date of Service: 04/02/2017 9:15 AM Medical Record Number: [id number] Patient Account Number: [id number] Date of Birth/Sex: March 16, 1981 (36 y.o. Male) Treating RN: Roger Shelter Primary Care Genavie Boettger: [name] Other Clinician: Referring Andron Marrazzo: [name] Treating Kayani Rapaport/Extender: Tito Dine in Treatment: 75 Visit Information History Since Last Visit Added or deleted any medications: Yes Patient Arrived: Ambulatory Any new allergies or adverse reactions: No Arrival Time: 09:10 Had a fall or experienced change in No Accompanied By: self activities of daily living that may affect Transfer Assistance: None risk of falls: Patient Identification Verified: Yes Signs or symptoms of abuse/neglect since last visito No Secondary Verification Process Completed: Yes Hospitalized since last visit: No Patient Requires Transmission-Based No Pain Present Now: Yes Precautions: Patient Has Alerts: No Electronic Signature(s) Signed: 04/02/2017 4:44:57 PM By: Roger Shelter Entered By: Roger Shelter on 04/02/2017 09:11:16 Karan, [name] ([id number]) -------------------------------------------------------------------------------- Clinic Level of Care Assessment Details Patient Name: [name] Date of Service: 04/02/2017 9:15 AM Medical Record Number: [id number] Patient Account Number: [id number] Date of Birth/Sex: 1980-06-16 (36 y.o. Male) Treating RN: [name] Primary Care Salina Stanfield: [name] Other Clinician: Referring Dayanna Pryce: [name] Treating Miner Koral/Extender: Tito Dine in Treatment: 17 Clinic Level of Care Assessment Items TOOL 4 Quantity Score []  - Use when only an EandM is performed on FOLLOW-UP visit 0 ASSESSMENTS - Nursing Assessment / Reassessment []  - Reassessment of Co-morbidities (includes updates in  patient status) 0 X- 1 5 Reassessment of Adherence to Treatment Plan ASSESSMENTS - Wound and Skin Assessment / Reassessment []  - Simple Wound Assessment / Reassessment - one wound 0 X- 2 5 Complex Wound Assessment / Reassessment - multiple wounds []  - 0 Dermatologic / Skin Assessment (not related to wound area) ASSESSMENTS - Focused Assessment []  - Circumferential Edema Measurements - multi extremities 0 []  - 0 Nutritional Assessment / Counseling / Intervention []  - 0 Lower Extremity Assessment (monofilament, tuning fork, pulses) []  - 0 Peripheral Arterial Disease Assessment (using hand held doppler) ASSESSMENTS - Ostomy and/or Continence Assessment and Care []  - Incontinence Assessment and Management 0 []  - 0 Ostomy Care Assessment and Management (repouching, etc.) PROCESS - Coordination of Care X - Simple Patient / Family Education for ongoing care 1 15 []  - 0 Complex (extensive) Patient / Family Education for ongoing care []  - 0 Staff obtains Programmer, systems, Records, Test Results / Process Orders []  - 0 Staff telephones HHA, Nursing Homes / Clarify orders / etc []  - 0 Routine Transfer to another Facility (non-emergent condition) []  - 0 Routine Hospital Admission (non-emergent condition) []  - 0 New Admissions / Biomedical engineer / Ordering NPWT, Apligraf, etc. []  - 0 Emergency Hospital Admission (emergent condition) X- 1 10 Simple Discharge Coordination Geffrard, Garmon ([id number]) []  - 0 Complex (extensive) Discharge Coordination PROCESS - Special Needs []  - Pediatric / Minor Patient Management 0 []  - 0 Isolation Patient Management []  - 0 Hearing / Language / Visual special needs []  - 0 Assessment of Community assistance (transportation, D/C planning, etc.) []  - 0 Additional assistance / Altered mentation []  - 0 Support Surface(s) Assessment (bed, cushion, seat, etc.) INTERVENTIONS - Wound Cleansing / Measurement X - Simple Wound Cleansing - one wound 1 5 []  -  0 Complex Wound Cleansing - multiple wounds X- 1 5 Wound Imaging (photographs - any number of wounds) []  - 0 Wound Tracing (instead of photographs) X- 1 5 Simple Wound Measurement - one wound []  -  0 Complex Wound Measurement - multiple wounds INTERVENTIONS - Wound Dressings []  - Small Wound Dressing one or multiple wounds 0 X- 1 15 Medium Wound Dressing one or multiple wounds []  - 0 Large Wound Dressing one or multiple wounds []  - 0 Application of Medications - topical []  - 0 Application of Medications - injection INTERVENTIONS - Miscellaneous []  - External ear exam 0 []  - 0 Specimen Collection (cultures, biopsies, blood, body fluids, etc.) []  - 0 Specimen(s) / Culture(s) sent or taken to Lab for analysis []  - 0 Patient Transfer (multiple staff / Civil Service fast streamer / Similar devices) []  - 0 Simple Staple / Suture removal (25 or less) []  - 0 Complex Staple / Suture removal (26 or more) []  - 0 Hypo / Hyperglycemic Management (close monitor of Blood Glucose) []  - 0 Ankle / Brachial Index (ABI) - do not check if billed separately X- 1 5 Vital Signs Roskos, Tell ([id number]) Has the patient been seen at the hospital within the last three years: Yes Total Score: 75 Level Of Care: New/Established - Level 2 Electronic Signature(s) Signed: 04/02/2017 1:31:09 PM By: [name], [name], RN, [name], [name], [name] Entered By: [name], [name], RN, [name], [name] on 04/02/2017 09:46:24 [name] ([id number]) -------------------------------------------------------------------------------- Encounter Discharge Information Details Patient Name: [name] Date of Service: 04/02/2017 9:15 AM Medical Record Number: [id number] Patient Account Number: [id number] Date of Birth/Sex: 1981-04-16 (36 y.o. Male) Treating RN: [name] Primary Care Kristi Hyer: [name] Other Clinician: Referring Danell Vazquez: [name] Treating Hadleigh Felber/Extender: Tito Dine in Treatment: 17 Encounter Discharge  Information Items Discharge Pain Level: 0 Discharge Condition: Stable Ambulatory Status: Ambulatory Discharge Destination: Home Transportation: Private Auto Accompanied By: self Schedule Follow-up Appointment: Yes Medication Reconciliation completed and Yes provided to Patient/Care Ryaan Vanwagoner: Provided on Clinical Summary of Care: 04/02/2017 Form Type Recipient Paper Patient RL Electronic Signature(s) Signed: 04/02/2017 1:31:09 PM By: [name], [name], RN, [name], [name], [name] Entered By: [name], [name], RN, [name], [name] on 04/02/2017 09:50:25 [name] ([id number]) -------------------------------------------------------------------------------- Lower Extremity Assessment Details Patient Name: [name] Date of Service: 04/02/2017 9:15 AM Medical Record Number: [id number] Patient Account Number: [id number] Date of Birth/Sex: 04/22/1981 (36 y.o. Male) Treating RN: Roger Shelter Primary Care Burtis Imhoff: [name] Other Clinician: Referring Mahayla Haddaway: [name] Treating Amaurie Wandel/Extender: Ricard Dillon Weeks in Treatment: 17 Edema Assessment Assessed: [Left: No] [Right: No] Edema: [Left: Yes] [Right: Yes] Vascular Assessment Claudication: Claudication Assessment [Left:Intermittent] [Right:None] Pulses: Dorsalis Pedis Palpable: [Left:Yes] [Right:Yes] Posterior Tibial Extremity colors, hair growth, and conditions: Extremity Color: [Left:Red] [Right:Normal] Temperature of Extremity: [Left:Hot] [Right:Warm] Capillary Refill: [Left:< 3 seconds] [Right:< 3 seconds] Toe Nail Assessment Left: Right: Thick: No No Discolored: No No Deformed: No No Improper Length and Hygiene: No No Electronic Signature(s) Signed: 04/02/2017 4:44:57 PM By: Roger Shelter Entered By: Roger Shelter on 04/02/2017 09:23:10 [name] ([id number]) -------------------------------------------------------------------------------- Multi Wound Chart Details Patient Name: [name] Date  of Service: 04/02/2017 9:15 AM Medical Record Number: [id number] Patient Account Number: [id number] Date of Birth/Sex: 04-Nov-1980 (36 y.o. Male) Treating RN: Roger Shelter Primary Care Makailee Nudelman: [name] Other Clinician: Referring Ahjanae Cassel: [name] Treating Leighton Brickley/Extender: Ricard Dillon Weeks in Treatment: 17 Vital Signs Height(in): 71 Pulse(bpm): 84 Weight(lbs): 338 Blood Pressure(mmHg): 150/93 Body Mass Index(BMI): 47 Temperature(F): 97.8 Respiratory Rate 18 (breaths/min): Photos: [N/A:N/A] Wound Location: Left Lower Leg - Lateral Right Lower Leg - Posterior N/A Wounding Event: Gradually Appeared Surgical Injury N/A Primary Etiology: Pyoderma Open Surgical Wound N/A Comorbid History: Sleep Apnea, Hypertension, Sleep Apnea, Hypertension, N/A Colitis  Colitis Date Acquired: 11/18/2015 02/02/2017 N/A Weeks of Treatment: 17 8 N/A Wound Status: Open Open N/A Measurements L x W x D 6x5.2x0.3 1.1x1.2x0.3 N/A (cm) Area (cm) : 24.504 1.037 N/A Volume (cm) : 7.351 0.311 N/A % Reduction in Area: -399.20% -109.50% N/A % Reduction in Volume: -87.20% -214.10% N/A Classification: Full Thickness Without Partial Thickness N/A Exposed Support Structures Exudate Amount: Large Medium N/A Exudate Type: Serosanguineous Serosanguineous N/A Exudate Color: red, brown red, brown N/A Foul Odor After Cleansing: Yes No N/A Odor Anticipated Due to No N/A N/A Product Use: Wound Margin: Distinct, outline attached Distinct, outline attached N/A Granulation Amount: Small (1-33%) Medium (34-66%) N/A Granulation Quality: Red Red N/A Necrotic Amount: Large (67-100%) Medium (34-66%) N/A Exposed Structures: Fat Layer (Subcutaneous Fat Layer (Subcutaneous N/A Tissue) Exposed: Yes Tissue) Exposed: Yes Fascia: No Fascia: No Walter, Samir ([id number]) Tendon: No Tendon: No Muscle: No Muscle: No Joint: No Joint: No Bone: No Bone: No Epithelialization: None None  N/A Periwound Skin Texture: Induration: Yes Excoriation: No N/A Excoriation: No Induration: No Callus: No Callus: No Crepitus: No Crepitus: No Rash: No Rash: No Scarring: No Scarring: No Periwound Skin Moisture: Maceration: No Maceration: No N/A Dry/Scaly: No Dry/Scaly: No Periwound Skin Color: Erythema: Yes Erythema: Yes N/A Atrophie Blanche: No Atrophie Blanche: No Cyanosis: No Cyanosis: No Ecchymosis: No Ecchymosis: No Hemosiderin Staining: No Hemosiderin Staining: No Mottled: No Mottled: No Pallor: No Pallor: No Rubor: No Rubor: No Erythema Location: Circumferential Circumferential N/A Erythema Change: Increased N/A N/A Temperature: No Abnormality No Abnormality N/A Tenderness on Palpation: Yes Yes N/A Wound Preparation: Ulcer Cleansing: Ulcer Cleansing: N/A Rinsed/Irrigated with Saline Rinsed/Irrigated with Saline Topical Anesthetic Applied: Topical Anesthetic Applied: None, Other: Lidocaine 4% None, Other: Lidocaine 4% Treatment Notes Electronic Signature(s) Signed: 04/02/2017 4:36:42 PM By: Linton Ham MD Entered By: Linton Ham on 04/02/2017 09:40:15 [name] ([id number]) -------------------------------------------------------------------------------- Multi-Disciplinary Care Plan Details Patient Name: [name] Date of Service: 04/02/2017 9:15 AM Medical Record Number: [id number] Patient Account Number: [id number] Date of Birth/Sex: 1981-01-17 (36 y.o. Male) Treating RN: Roger Shelter Primary Care Jenette Rayson: [name] Other Clinician: Referring Enrigue Hashimi: [name] Treating Edwena Mayorga/Extender: Tito Dine in Treatment: 17 Active Inactive ` Orientation to the Wound Care Program Nursing Diagnoses: Knowledge deficit related to the wound healing center program Goals: Patient/caregiver will verbalize understanding of the [facility] Program Date Initiated: 12/11/2016 Target Resolution Date:  03/13/2017 Goal Status: Active Interventions: Provide education on orientation to the wound center Notes: ` Venous Leg Ulcer Nursing Diagnoses: Knowledge deficit related to disease process and management Potential for venous Insuffiency (use before diagnosis confirmed) Goals: Non-invasive venous studies are completed as ordered Date Initiated: 12/11/2016 Target Resolution Date: 03/13/2017 Goal Status: Active Patient will maintain optimal edema control Date Initiated: 12/11/2016 Target Resolution Date: 03/13/2017 Goal Status: Active Patient/caregiver will verbalize understanding of disease process and disease management Date Initiated: 12/11/2016 Target Resolution Date: 03/13/2017 Goal Status: Active Verify adequate tissue perfusion prior to therapeutic compression application Date Initiated: 12/11/2016 Target Resolution Date: 03/13/2017 Goal Status: Active Interventions: Assess peripheral edema status every visit. Compression as ordered Provide education on venous insufficiency Treatment Activities: SHINICHI, ANGUIANO ([id number]) Therapeutic compression applied : 12/11/2016 Notes: ` Wound/Skin Impairment Nursing Diagnoses: Impaired tissue integrity Knowledge deficit related to ulceration/compromised skin integrity Goals: Patient/caregiver will verbalize understanding of skin care regimen Date Initiated: 12/11/2016 Target Resolution Date: 03/13/2017 Goal Status: Active Ulcer/skin breakdown will have a volume reduction of 30% by week 4 Date Initiated: 12/11/2016 Target Resolution Date: 03/13/2017 Goal Status:  Active Ulcer/skin breakdown will have a volume reduction of 50% by week 8 Date Initiated: 12/11/2016 Target Resolution Date: 03/13/2017 Goal Status: Active Ulcer/skin breakdown will have a volume reduction of 80% by week 12 Date Initiated: 12/11/2016 Target Resolution Date: 03/13/2017 Goal Status: Active Ulcer/skin breakdown will heal within 14 weeks Date Initiated:  12/11/2016 Target Resolution Date: 03/13/2017 Goal Status: Active Interventions: Assess patient/caregiver ability to obtain necessary supplies Assess patient/caregiver ability to perform ulcer/skin care regimen upon admission and as needed Assess ulceration(s) every visit Provide education on ulcer and skin care Treatment Activities: Skin care regimen initiated : 12/11/2016 Topical wound management initiated : 12/11/2016 Notes: Electronic Signature(s) Signed: 04/02/2017 4:44:57 PM By: Roger Shelter Entered By: Roger Shelter on 04/02/2017 09:23:19 [name] ([id number]) -------------------------------------------------------------------------------- Pain Assessment Details Patient Name: [name] Date of Service: 04/02/2017 9:15 AM Medical Record Number: [id number] Patient Account Number: [id number] Date of Birth/Sex: 12-Jun-1980 (36 y.o. Male) Treating RN: Roger Shelter Primary Care Ayliana Casciano: [name] Other Clinician: Referring Jasmyn Picha: [name] Treating Tashya Alberty/Extender: Tito Dine in Treatment: 17 Active Problems Location of Pain Severity and Description of Pain Patient Has Paino Yes Site Locations Pain Location: Pain in Ulcers Duration of the Pain. Constant / Intermittento Intermittent Rate the pain. Current Pain Level: 3 Character of Pain Describe the Pain: Other: "hot butter knife" Pain Management and Medication Current Pain Management: Medication: No Cold Application: No Rest: No Massage: No Activity: No T.E.N.S.: No Heat Application: No Leg drop or elevation: No Is the Current Pain Management Adequate: Inadequate Electronic Signature(s) Signed: 04/02/2017 4:44:57 PM By: Roger Shelter Entered By: Roger Shelter on 04/02/2017 09:11:58 [name] ([id number]) -------------------------------------------------------------------------------- Patient/Caregiver Education Details Patient Name: [name] Date of  Service: 04/02/2017 9:15 AM Medical Record Number: [id number] Patient Account Number: [id number] Date of Birth/Gender: Feb 18, 1981 (36 y.o. Male) Treating RN: [name] Primary Care Physician: [name] Other Clinician: Referring Physician: [name] Treating Physician/Extender: Tito Dine in Treatment: 17 Education Assessment Education Provided To: Patient Education Topics Provided Wound/Skin Impairment: Handouts: Caring for Your Ulcer Methods: Demonstration, Explain/Verbal Responses: State content correctly Electronic Signature(s) Signed: 04/02/2017 1:31:09 PM By: [name], [name], RN, [name], [name], [name] Entered By: [name], [name], RN, [name], [name] on 04/02/2017 09:50:45 [name] ([id number]) -------------------------------------------------------------------------------- Wound Assessment Details Patient Name: [name] Date of Service: 04/02/2017 9:15 AM Medical Record Number: [id number] Patient Account Number: [id number] Date of Birth/Sex: 10-29-1980 (36 y.o. Male) Treating RN: Roger Shelter Primary Care Ninfa Giannelli: [name] Other Clinician: Referring Morrie Daywalt: [name] Treating Michaelanthony Kempton/Extender: Ricard Dillon Weeks in Treatment: 17 Wound Status Wound Number: 1 Primary Etiology: Pyoderma Wound Location: Left Lower Leg - Lateral Wound Status: Open Wounding Event: Gradually Appeared Comorbid History: Sleep Apnea, Hypertension, Colitis Date Acquired: 11/18/2015 Weeks Of Treatment: 17 Clustered Wound: No Photos Wound Measurements Length: (cm) 6 Width: (cm) 5.2 Depth: (cm) 0.3 Area: (cm) 24.504 Volume: (cm) 7.351 % Reduction in Area: -399.2% % Reduction in Volume: -87.2% Epithelialization: None Tunneling: No Undermining: No Wound Description Full Thickness Without Exposed Support Classification: Structures Wound Margin: Distinct, outline attached Exudate Large Amount: Exudate Type: Serosanguineous Exudate Color: red,  brown Foul Odor After Cleansing: Yes Due to Product Use: No Slough/Fibrino Yes Wound Bed Granulation Amount: Small (1-33%) Exposed Structure Granulation Quality: Red Fascia Exposed: No Necrotic Amount: Large (67-100%) Fat Layer (Subcutaneous Tissue) Exposed: Yes Necrotic Quality: Adherent Slough Tendon Exposed: No Muscle Exposed: No Joint Exposed: No Bone Exposed: No Periwound Skin Texture Salvo, Zein ([id number]) Texture Color No Abnormalities Noted: No  No Abnormalities Noted: No Callus: No Atrophie Blanche: No Crepitus: No Cyanosis: No Excoriation: No Ecchymosis: No Induration: Yes Erythema: Yes Rash: No Erythema Location: Circumferential Scarring: No Erythema Change: Increased Hemosiderin Staining: No Moisture Mottled: No No Abnormalities Noted: No Pallor: No Dry / Scaly: No Rubor: No Maceration: No Temperature / Pain Temperature: No Abnormality Tenderness on Palpation: Yes Wound Preparation Ulcer Cleansing: Rinsed/Irrigated with Saline Topical Anesthetic Applied: None, Other: Lidocaine 4%, Treatment Notes Wound #1 (Left, Lateral Lower Leg) 1. Cleansed with: Clean wound with Normal Saline 2. Anesthetic Topical Lidocaine 4% cream to wound bed prior to debridement 4. Dressing Applied: Other dressing (specify in notes) 7. Secured with Patient to wear own compression stockings Notes Anacept gel and telfa Manufacturing systems engineer) Signed: 04/02/2017 4:44:57 PM By: Roger Shelter Entered By: Roger Shelter on 04/02/2017 09:20:22 [name] ([id number]) -------------------------------------------------------------------------------- Wound Assessment Details Patient Name: [name] Date of Service: 04/02/2017 9:15 AM Medical Record Number: [id number] Patient Account Number: [id number] Date of Birth/Sex: 23-Nov-1980 (36 y.o. Male) Treating RN: Roger Shelter Primary Care Shelah Heatley: [name] Other Clinician: Referring Eriyana Sweeten:  [name] Treating Pietra Zuluaga/Extender: Tito Dine in Treatment: 17 Wound Status Wound Number: 2 Primary Etiology: Open Surgical Wound Wound Location: Right Lower Leg - Posterior Wound Status: Open Wounding Event: Surgical Injury Comorbid History: Sleep Apnea, Hypertension, Colitis Date Acquired: 02/02/2017 Weeks Of Treatment: 8 Clustered Wound: No Photos Wound Measurements Length: (cm) 1.1 Width: (cm) 1.2 Depth: (cm) 0.3 Area: (cm) 1.037 Volume: (cm) 0.311 % Reduction in Area: -109.5% % Reduction in Volume: -214.1% Epithelialization: None Tunneling: No Undermining: No Wound Description Classification: Partial Thickness Foul Odor Wound Margin: Distinct, outline attached Slough/Fi Exudate Amount: Medium Exudate Type: Serosanguineous Exudate Color: red, brown After Cleansing: No brino Yes Wound Bed Granulation Amount: Medium (34-66%) Exposed Structure Granulation Quality: Red Fascia Exposed: No Necrotic Amount: Medium (34-66%) Fat Layer (Subcutaneous Tissue) Exposed: Yes Necrotic Quality: Adherent Slough Tendon Exposed: No Muscle Exposed: No Joint Exposed: No Bone Exposed: No Periwound Skin Texture Texture Color Caras, Corwin ([id number]) No Abnormalities Noted: No No Abnormalities Noted: No Callus: No Atrophie Blanche: No Crepitus: No Cyanosis: No Excoriation: No Ecchymosis: No Induration: No Erythema: Yes Rash: No Erythema Location: Circumferential Scarring: No Hemosiderin Staining: No Mottled: No Moisture Pallor: No No Abnormalities Noted: No Rubor: No Dry / Scaly: No Maceration: No Temperature / Pain Temperature: No Abnormality Tenderness on Palpation: Yes Wound Preparation Ulcer Cleansing: Rinsed/Irrigated with Saline Topical Anesthetic Applied: None, Other: Lidocaine 4%, Treatment Notes Wound #2 (Right, Posterior Lower Leg) 1. Cleansed with: Clean wound with Normal Saline 2. Anesthetic Topical Lidocaine 4% cream to  wound bed prior to debridement 4. Dressing Applied: Other dressing (specify in notes) 7. Secured with Patient to wear own compression stockings Notes Anacept gel and telfa Manufacturing systems engineer) Signed: 04/02/2017 4:44:57 PM By: Roger Shelter Entered By: Roger Shelter on 04/02/2017 09:21:47 [name] ([id number]) -------------------------------------------------------------------------------- [place] Details Patient Name: [name] Date of Service: 04/02/2017 9:15 AM Medical Record Number: [id number] Patient Account Number: [id number] Date of Birth/Sex: 1980-07-10 (36 y.o. Male) Treating RN: Roger Shelter Primary Care Edith Lord: [name] Other Clinician: Referring Fredi Hurtado: [name] Treating Karne Ozga/Extender: Tito Dine in Treatment: 17 Vital Signs Time Taken: 09:14 Temperature (F): 97.8 Height (in): 71 Pulse (bpm): 84 Weight (lbs): 338 Respiratory Rate (breaths/min): 18 Body Mass Index (BMI): 47.1 Blood Pressure (mmHg): 150/93 Reference Range: 80 - 120 mg / dl Electronic Signature(s) Signed: 04/02/2017 4:44:57 PM By: Roger Shelter Entered By: Roger Shelter on  04/02/2017 09:15:25 

## 2017-04-03 NOTE — Progress Notes (Signed)
[name], [name] ([id number]) Visit Report for 04/02/2017 HPI Details Patient Name: [name], [name] Date of Service: 04/02/2017 9:15 AM Medical Record Number: [id number] Patient Account Number: [id number] Date of Birth/Sex: 1980-11-27 (36 y.o. Male) Treating RN: Huel Coventry Primary Care Provider: [name] Other Clinician: Referring Provider: [name] Treating Provider/Extender: Maxwell Caul Weeks in Treatment: 17 History of Present Illness HPI Description: 12/04/16; 36 year old man who comes into the clinic today for review of a wound on the posterior left calf. He tells me that is been there for about a year. He is not a diabetic he does smoke half a pack per day. He was seen in the ER on 11/20/16 felt to have cellulitis around the wound and was given clindamycin. An x-ray did not show osteomyelitis. The patient initially tells me that he has a milk allergy that sets off a pruritic itching rash on his lower legs which she scratches incessantly and he thinks that's what may have set up the wound. He has been using various topical antibiotics and ointments without any effect. He works in a [employer]ing [employer] and is on his feet all day. He does not have a prior history of wounds however he does have the rash on both lower legs the right arm and the ventral aspect of his left arm. These are excoriations and clearly have had scratching however there are of macular looking areas on both legs including a substantial larger area on the right leg. This does not have an underlying open area. There is no blistering. 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 He seemed to recognize this. 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 He is not in any treatment for this. He's had no recent  travel 12/11/16; the patient arrives today with his wound and roughly the same condition we've been using silver alginate this is a deep punched out wound with some surrounding erythema but no tenderness. Biopsy I did did not show confirmed pyoderma gangrenosum suggested nonspecific inflammation and vasculitis but does not provide an actual description of what was seen by the pathologist. I'm really not able to understand this We have also received information from the patient's dermatologist in [place] notes from April 2016. This was a doctor Agarwal- [facility]al. 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 He was prescribed topical steroid high potency under occlusion which helped but at this point the patient did not have a deep punched out wound. 12/18/16; the patient's wound is larger in terms of surface area however this surface looks better and there is less depth. The surrounding erythema also is better. The patient states that the wrap we put on came off 2 days ago when he has been using his compression stockings. He we are in the process of getting a dermatology consult. 12/26/16 on evaluation today patient's left lower extremity wound shows evidence of infection with surrounding erythema noted. He has been tolerating the dressing changes but states that he has noted more discomfort. There is a larger area of erythema surrounding the wound. No fevers, chills, nausea, or vomiting noted at this time. With that being said the wound still does have slough covering the surface. He is not allergic to any medication that he is aware of at this  point. In regard to his right lower extremity he had several regions that are erythematous and pruritic he wonders if there's anything we can do to help that. 01/02/17 I reviewed patient's wound culture which was obtained his visit last week. He was placed on doxycycline at that point. Unfortunately that does not appear to be an antibiotic that would  likely help with the situation however the pseudomonas noted on culture is sensitive to Cipro. Also unfortunately patient's wound seems to have a large compared to last week's evaluation. Not severely so but there are definitely increased measurements in general. He is continuing to have discomfort as well he writes this to be a seven out of 10. In fact he would prefer me not to perform any debridement today due to the fact that he is having discomfort and considering he has an active infection on the little reluctant to do so anyway. No fevers, chills, nausea, or vomiting noted at this time. 01/08/17; patient seems dermatology on September 5. I suspect dermatology will want the slides from the biopsy I did sent to their pathologist. I'm not sure if there is a way we can expedite that. In any case the culture I did before I left on vacation 3 [name], [name] ([id number]) weeks ago showed Pseudomonas he was given 10 days of Cipro and per her description of her intake nurses is actually somewhat better this week although the wound is quite a bit bigger than I remember the last time I saw this. He still has 3 more days of Cipro 01/21/17; dermatology appointment tomorrow. He has completed the ciprofloxacin for Pseudomonas. Surface of the wound looks better however he is had some deterioration in the lesions on his right leg. Meantime the left lateral leg wound we will continue with sample 01/29/17; patient had his dermatology appointment but I can't yet see that note. He is completed his antibiotics. The wound is more superficial but considerably larger in circumferential area than when he came in. This is in his left lateral calf. He also has swollen erythematous areas with superficial wounds on the right leg and small papular areas on both arms. There apparently areas in her his upper thighs and buttocks I did not look at those. Dermatology biopsied the right leg. Hopefully will have their input next  week. 02/05/17; patient went back to see his dermatologist who told him that he had a "scratching problem" as well as staph. He is now on a 30 day course of doxycycline and I believe she gave him triamcinolone cream to the right leg areas to help with the itching [not exactly sure but probably triamcinolone]. She apparently looked at the left lateral leg wound although this was not rebiopsied and I think felt to be ultimately part of the same pathogenesis. He is using sample border foam and changing nevus himself. He now has a new open area on the right posterior leg which was his biopsy site I don't have any of the dermatology notes 02/12/17; we put the patient in compression last week with SANTYL to the wound on the left leg and the biopsy. Edema is much better and the depth of the wound is now at level of skin. Area is still the same oBiopsy site on the right lateral leg we've also been using santyl with a border foam dressing and he is changing this himself. 02/19/17; Using silver alginate started last week to both the substantial left leg wound and the biopsy site on the right wound.  He is tolerating compression well. Has a an appointment with his primary M.D. tomorrow wondering about diuretics although I'm wondering if the edema problem is actually lymphedema 02/26/17; the patient has been to see his primary doctor Dr. Jerelyn Charles at [facility] our primary care. She started him on Lasix 20 mg and this seems to have helped with the edema. However we are not making substantial change with the left lateral calf wound and inflammation. The biopsy site on the right leg also looks stable but not really all that different. 03/12/17; the patient has been to see vein and vascular Dr. Wyn Quaker. He has had venous reflux studies I have not reviewed these. I did get a call from his dermatology office. They felt that he might have pathergy based on their biopsy on his right leg which led them to look at the slides  of the biopsy I did on the left leg and they wonder whether this represents pyoderma gangrenosum which was the original supposition in a man with ulcerative colitis albeit inactive for many years. They therefore recommended clobetasol and tetracycline i.e. 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 03/26/17; apparently the patient just had reflux studies not an appointment with Dr. dew. She arrives in clinic today having applied clobetasol for 2-3 weeks. He notes over the last 2-3 days excessive drainage having to change the dressing 3-4 times a day and also expanding erythema. 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 Carles Collet at [facility] dermatology locally who kindly saw him at my request. 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 My concern was the degree of erythema medially extending from his left leg wound which was either inflammation from pyoderma or cellulitis. I put him on Augmentin however culture of the wound showed Pseudomonas which is quinolone sensitive. I really don't believe he has cellulitis however in view of everything I will continue and give him a course of Cipro. He is also on doxycycline as an immune modulator for the pyoderma. In addition to his original wound on the left lateral leg with surrounding erythema he has a wound on the right posterior calf which was an original biopsy site done by dermatology. This was felt to represent pathergy from pyoderma gangrenosum Electronic Signature(s) Signed: 04/02/2017 4:36:42 PM By: Baltazar Najjar MD Entered By: Baltazar Najjar on 04/02/2017 09:43:53 [name], [name]  ([id number]) -------------------------------------------------------------------------------- Physical Exam Details Patient Name: [name] Date of Service: 04/02/2017 9:15 AM Medical Record Number: [id number] Patient Account Number: [id number] Date of Birth/Sex: 1980/06/03 (36 y.o. Male) Treating RN: Huel Coventry Primary Care Provider: [name] Other Clinician: Referring Provider: [name] Treating Provider/Extender: Altamese Pemberwick in Treatment: 17 Constitutional Patient is hypertensive.. Pulse regular and within target range for patient.Marland Kitchen Respirations regular, non-labored and within target range.. Temperature is normal and within the target range for the patient.Marland Kitchen appears in no distress. Eyes Conjunctivae clear. No discharge. Respiratory Respiratory effort is easy and symmetric bilaterally. Rate is normal at rest and on room air.. Cardiovascular Pedal pulses palpable and strong bilaterally.. Lymphatic Nonpalpable in the popliteal area.. Integumentary (Hair, Skin) The erythema extending medially from the left lateral calf wound is a lot better and I suspect this is not actually  cellulitis. There is no tenderness. Psychiatric No evidence of depression, anxiety, or agitation. Calm, cooperative, and communicative. Appropriate interactions and affect.. Notes Wound exam; left lateral leg wound and a regular jagged surface to this. No debridement. The surrounding erythema especially medially is better than last week which I think is related to the pyoderma/inflammation and not cellulitis oThe area on the right medial calf looks better which was an initial biopsy site Electronic Signature(s) Signed: 04/02/2017 4:36:42 PM By: Baltazar Najjar MD Entered By: Baltazar Najjar on 04/02/2017 09:46:12 [name] ([id number]) -------------------------------------------------------------------------------- Physician Orders Details Patient Name: [name] Date of  Service: 04/02/2017 9:15 AM Medical Record Number: [id number] Patient Account Number: [id number] Date of Birth/Sex: 1981-02-05 (36 y.o. Male) Treating RN: Huel Coventry Primary Care Provider: [name] Other Clinician: Referring Provider: [name] Treating Provider/Extender: Altamese Bruce in Treatment: 66 Verbal / Phone Orders: No Diagnosis Coding Wound Cleansing Wound #1 Left,Lateral Lower Leg o Clean wound with Normal Saline. Wound #2 Right,Posterior Lower Leg o Clean wound with Normal Saline. Anesthetic Wound #1 Left,Lateral Lower Leg o Topical Lidocaine 4% cream applied to wound bed prior to debridement Wound #2 Right,Posterior Lower Leg o Topical Lidocaine 4% cream applied to wound bed prior to debridement Primary Wound Dressing Wound #1 Left,Lateral Lower Leg o Other: - Anacept gel (patient to order on [employer]) Wound #2 Right,Posterior Lower Leg o Other: - Anacept gel (patient to order on [employer]) Secondary Dressing Wound #1 Left,Lateral Lower Leg o Boardered Foam Dressing Wound #2 Right,Posterior Lower Leg o Boardered Foam Dressing Dressing Change Frequency Wound #1 Left,Lateral Lower Leg o Change dressing every day. Wound #2 Right,Posterior Lower Leg o Change dressing every day. Follow-up Appointments Wound #1 Left,Lateral Lower Leg o Return Appointment in 2 weeks. Wound #2 Right,Posterior Lower Leg o Return Appointment in 2 weeks. Edema Control Dormer, Cornelius ([id number]) Wound #1 Left,Lateral Lower Leg o Patient to wear own compression stockings Wound #2 Right,Posterior Lower Leg o Patient to wear own compression stockings Medications-please add to medication list. Wound #1 Left,Lateral Lower Leg o P.O. Antibiotics - Continue Doxy, Add Cipro Wound #2 Right,Posterior Lower Leg o P.O. Antibiotics - Continue Doxy, Add Cipro Patient Medications Allergies: mik, Biaxin, seasonal Notifications Medication Indication  Start End Cipro possible wound 04/02/2017 infection DOSE oral 500 mg tablet - 1 tablet oral bid for 7 days Electronic Signature(s) Signed: 04/02/2017 9:48:27 AM By: Baltazar Najjar MD Entered By: Baltazar Najjar on 04/02/2017 09:48:23 [name] ([id number]) -------------------------------------------------------------------------------- Problem List Details Patient Name: [name] Date of Service: 04/02/2017 9:15 AM Medical Record Number: [id number] Patient Account Number: [id number] Date of Birth/Sex: [birth year] (36 y.o. 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 12/04/2016 Yes L88 Pyoderma gangrenosum 03/26/2017 Yes I87.2 Venous insufficiency (chronic) (peripheral) 12/04/2016 Yes L97.213 Non-pressure chronic ulcer of right calf with necrosis of muscle 04/02/2017 Yes Inactive Problems Resolved Problems Electronic Signature(s) Signed: 04/02/2017 4:36:42 PM By: Baltazar Najjar MD Entered By: Baltazar Najjar on 04/02/2017 09:39:55 [name] ([id number]) -------------------------------------------------------------------------------- Progress Note Details Patient Name: [name] Date of Service: 04/02/2017 9:15 AM Medical Record Number: [id number] Patient Account Number: [id number] Date of Birth/Sex: 02/14/1981 (36 y.o. Male) Treating RN: Huel Coventry Primary Care Provider: [name] Other Clinician: Referring Provider: [name] Treating Provider/Extender: Maxwell Caul Weeks in Treatment: 17  Subjective History of Present Illness (HPI) 12/04/16; 36 year old man who comes into the clinic today for review of a wound on the posterior left calf. He tells me that  is been there for about a year. He is not a diabetic he does smoke half a pack per day. He was seen in the ER on 11/20/16 felt to have cellulitis around the wound and was given clindamycin. An x-ray did not show osteomyelitis. The patient initially tells me that he has a milk allergy that sets off a pruritic itching rash on his lower legs which she scratches incessantly and he thinks that's what may have set up the wound. He has been using various topical antibiotics and ointments without any effect. He works in a [employer]ing [employer] and is on his feet all day. He does not have a prior history of wounds however he does have the rash on both lower legs the right arm and the ventral aspect of his left arm. These are excoriations and clearly have had scratching however there are of macular looking areas on both legs including a substantial larger area on the right leg. This does not have an underlying open area. There is no blistering. 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 He seemed to recognize this. 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 He is not in any treatment for this. He's had no recent travel 12/11/16; the patient arrives today with his wound and roughly the same condition we've been using silver alginate this is a deep punched out wound with some surrounding erythema but no tenderness. Biopsy I did did not show confirmed pyoderma gangrenosum suggested nonspecific inflammation and vasculitis but does not provide an actual description of what was seen by the pathologist. I'm really not able to understand this We have also received information from the patient's dermatologist in [place] notes from April 2016. This was a doctor Agarwal- antal. The diagnosis seems to have been lichen  simplex chronicus. He was prescribed topical steroid high potency under occlusion which helped but at this point the patient did not have a deep punched out wound. 12/18/16; the patient's wound is larger in terms of surface area however this surface looks better and there is less depth. The surrounding erythema also is better. The patient states that the wrap we put on came off 2 days ago when he has been using his compression stockings. He we are in the process of getting a dermatology consult. 12/26/16 on evaluation today patient's left lower extremity wound shows evidence of infection with surrounding erythema noted. He has been tolerating the dressing changes but states that he has noted more discomfort. There is a larger area of erythema surrounding the wound. No fevers, chills, nausea, or vomiting noted at this time. With that being said the wound still does have slough covering the surface. He is not allergic to any medication that he is aware of at this point. In regard to his right lower extremity he had several regions that are erythematous and pruritic he wonders if there's anything we can do to help that. 01/02/17 I reviewed patient's wound culture which was obtained his visit last week. He was placed on doxycycline at that point. Unfortunately that does not appear to be an  antibiotic that would likely help with the situation however the pseudomonas noted on culture is sensitive to Cipro. Also unfortunately patient's wound seems to have a large compared to last week's evaluation. Not severely so but there are definitely increased measurements in general. He is continuing to have discomfort as well he writes this to be a seven out of 10. In fact he would prefer me not to perform any debridement today due to the fact that he is having discomfort and considering he has an active infection on the little reluctant to do so anyway. No fevers, chills, nausea, or vomiting noted at this  time. 01/08/17; patient seems dermatology on September 5. I suspect dermatology will want the slides from the biopsy I did sent to their pathologist. I'm not sure if there is a way we can expedite that. In any case the culture I did before I left on vacation 3 weeks ago showed Pseudomonas he was given 10 days of Cipro and per her description of her intake nurses is actually somewhat better this week although the wound is quite a bit bigger than I remember the last time I saw this. He still has 3 [name], [name] ([id number]) more days of Cipro 01/21/17; dermatology appointment tomorrow. He has completed the ciprofloxacin for Pseudomonas. Surface of the wound looks better however he is had some deterioration in the lesions on his right leg. Meantime the left lateral leg wound we will continue with sample 01/29/17; patient had his dermatology appointment but I can't yet see that note. He is completed his antibiotics. The wound is more superficial but considerably larger in circumferential area than when he came in. This is in his left lateral calf. He also has swollen erythematous areas with superficial wounds on the right leg and small papular areas on both arms. There apparently areas in her his upper thighs and buttocks I did not look at those. Dermatology biopsied the right leg. Hopefully will have their input next week. 02/05/17; patient went back to see his dermatologist who told him that he had a "scratching problem" as well as staph. He is now on a 30 day course of doxycycline and I believe she gave him triamcinolone cream to the right leg areas to help with the itching [not exactly sure but probably triamcinolone]. She apparently looked at the left lateral leg wound although this was not rebiopsied and I think felt to be ultimately part of the same pathogenesis. He is using sample border foam and changing nevus himself. He now has a new open area on the right posterior leg which was his biopsy  site I don't have any of the dermatology notes 02/12/17; we put the patient in compression last week with SANTYL to the wound on the left leg and the biopsy. Edema is much better and the depth of the wound is now at level of skin. Area is still the same Biopsy site on the right lateral leg we've also been using santyl with a border foam dressing and he is changing this himself. 02/19/17; Using silver alginate started last week to both the substantial left leg wound and the biopsy site on the right wound. He is tolerating compression well. Has a an appointment with his primary M.D. tomorrow wondering about diuretics although I'm wondering if the edema problem is actually lymphedema 02/26/17; the patient has been to see his primary doctor Dr. Jerelyn Charles at [facility] our primary care. She started him on Lasix 20 mg and this seems to have  helped with the edema. However we are not making substantial change with the left lateral calf wound and inflammation. The biopsy site on the right leg also looks stable but not really all that different. 03/12/17; the patient has been to see vein and vascular Dr. Wyn QuakerEW. He has had venous reflux studies I have not reviewed these. I did get a call from his dermatology office. 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 They therefore recommended clobetasol and tetracycline i.e. 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 03/26/17; apparently the patient just had reflux studies not an appointment with Dr. dew. She arrives in clinic today having applied clobetasol for 2-3 weeks. He notes over the last 2-3 days excessive drainage having to change the dressing 3-4 times a day and also expanding  erythema. 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 who kindly saw him at my request. 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 My concern was the degree of erythema medially extending from his left leg wound which was either inflammation from pyoderma or cellulitis. I put him on Augmentin however culture of the wound showed Pseudomonas which is quinolone sensitive. I really don't believe he has cellulitis however in view of everything I will continue and give him a course of Cipro. He is also on doxycycline as an immune modulator for the pyoderma. In addition to his original wound on the left lateral leg with surrounding erythema he has a wound on the right posterior calf which was an original biopsy site done by dermatology. 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 Pulse regular and within target range for patient.Marland Kitchen. Respirations regular, non-labored and within target range.. Temperature is normal and within the target range for the patient.Marland Kitchen. appears in no distress. [name], [name] ([id number]) Vitals Time Taken: 9:14 AM, Height: 71 in, Weight: 338 lbs, BMI: 47.1, Temperature: 97.8 F, Pulse: 84 bpm, Respiratory Rate: 18 breaths/min, Blood Pressure: 150/93 mmHg. Eyes Conjunctivae clear. No discharge. Respiratory Respiratory effort is easy and symmetric bilaterally. Rate is normal at rest and on room air.. Cardiovascular Pedal pulses palpable and strong bilaterally.. Lymphatic Nonpalpable in the popliteal area.Marland Kitchen. Psychiatric No evidence of depression, anxiety, or  agitation. Calm, cooperative, and communicative. Appropriate interactions and affect.. General Notes: Wound exam; left lateral leg wound and a regular jagged surface to this. No debridement. The surrounding erythema especially medially is better than last week which I think is related to the pyoderma/inflammation and not cellulitis The area on the right medial calf looks better which was an initial biopsy site Integumentary (Hair, Skin) The erythema extending medially from the left lateral calf wound is a lot better and I suspect this is not actually cellulitis. There is no tenderness. Wound #1 status is Open. Original cause of wound was Gradually Appeared. The wound is located on the Left,Lateral Lower Leg. The wound measures 6cm  length x 5.2cm width x 0.3cm depth; 24.504cm^2 area and 7.351cm^3 volume. There is Fat Layer (Subcutaneous Tissue) Exposed exposed. There is no tunneling or undermining noted. There is a large amount of serosanguineous drainage noted. Foul odor after cleansing was noted. The wound margin is distinct with the outline attached to the wound base. There is small (1-33%) red granulation within the wound bed. There is a large (67-100%) amount of necrotic tissue within the wound bed including Adherent Slough. The periwound skin appearance exhibited: Induration, Erythema. The periwound skin appearance did not exhibit: Callus, Crepitus, Excoriation, Rash, Scarring, Dry/Scaly, Maceration, Atrophie Blanche, Cyanosis, Ecchymosis, Hemosiderin Staining, Mottled, Pallor, Rubor. The surrounding wound skin color is noted with erythema which is circumferential. Periwound temperature was noted as No Abnormality. The periwound has tenderness on palpation. Wound #2 status is Open. Original cause of wound was Surgical Injury. The wound is located on the Right,Posterior Lower Leg. The wound measures 1.1cm length x 1.2cm width x 0.3cm depth; 1.037cm^2 area and 0.311cm^3 volume. There is  Fat Layer (Subcutaneous Tissue) Exposed exposed. There is no tunneling or undermining noted. There is a medium amount of serosanguineous drainage noted. The wound margin is distinct with the outline attached to the wound base. There is medium (34-66%) red granulation within the wound bed. There is a medium (34-66%) amount of necrotic tissue within the wound bed including Adherent Slough. The periwound skin appearance exhibited: Erythema. The periwound skin appearance did not exhibit: Callus, Crepitus, Excoriation, Induration, Rash, Scarring, Dry/Scaly, Maceration, Atrophie Blanche, Cyanosis, Ecchymosis, Hemosiderin Staining, Mottled, Pallor, Rubor. The surrounding wound skin color is noted with erythema which is circumferential. Periwound temperature was noted as No Abnormality. The periwound has tenderness on palpation. 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 Wound #2 Right,Posterior Lower Leg: Clean wound with Normal Saline. Anesthetic: Wound #1 Left,Lateral Lower Leg: Topical Lidocaine 4% cream applied to wound bed prior to debridement Wound #2 Right,Posterior Lower Leg: Topical Lidocaine 4% cream applied to wound bed prior to debridement Primary Wound Dressing: Wound #1 Left,Lateral Lower Leg: Other: - Anacept gel (patient to order on [employer]) Wound #2 Right,Posterior Lower Leg: Other: - Anacept gel (patient to order on [employer]) Secondary Dressing: Wound #1 Left,Lateral Lower Leg: Boardered Foam Dressing Wound #2 Right,Posterior Lower Leg: Boardered Foam Dressing Dressing Change Frequency: Wound #1 Left,Lateral Lower Leg: Change dressing every  day. Wound #2 Right,Posterior Lower Leg: Change dressing every day. Follow-up Appointments: Wound #1 Left,Lateral Lower Leg: Return Appointment in 2 weeks. Wound #2 Right,Posterior Lower Leg: Return Appointment in 2 weeks. Edema Control: Wound #1 Left,Lateral Lower Leg: Patient to wear own compression stockings Wound #2 Right,Posterior Lower Leg: Patient to wear own compression stockings Medications-please add to medication list.: Wound #1 Left,Lateral Lower Leg: P.O. Antibiotics - Continue Doxy, Add Cipro Wound #2 Right,Posterior Lower Leg: P.O. Antibiotics - Continue Doxy, Add Cipro The following medication(s) was prescribed: Cipro oral 500 mg tablet 1 tablet oral bid for 7 days for possible wound infection starting 04/02/2017 Alden, [name] ([id number]) o #1 pyoderma gangrenosum now on a combination of prednisone 60 a day and doxycycline #2 he has been using Vaseline over the wound however I really don't think that's going to be helpful. I have recommended Anasept gel  for now. Apparently Dr. Carles Collet did not want any further clobetasol as this is not lead to healing #3 we will follow him in 2 weeks #4 his insurance did not cover silver alginate that we prescribed last week #5 the Pseudomonas that we cultured on his wound surface may have been a colonizer nevertheless I felt it important to try to eradicate this. Cipro 500 twice a day for 7 days Electronic Signature(s) Signed: 04/02/2017 4:36:42 PM By: Baltazar Najjar MD Entered By: Baltazar Najjar on 04/02/2017 09:50:41 [name], [name] ([id number]) -------------------------------------------------------------------------------- SuperBill Details Patient Name: [name] Date of Service: 04/02/2017 Medical Record Number: [id number] Patient Account Number: [id number] Date of Birth/Sex: 05-14-1981 (36 y.o. Male) Treating RN: Huel Coventry Primary Care Provider: [name] Other Clinician: Referring Provider: [name] Treating Provider/Extender: Altamese [place] in Treatment: 17 Diagnosis Coding ICD-10 Codes Code Description [phone] 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sis of muscle L88 Pyoderma gangrenosum I87.2 Venous insufficiency (chronic) (peripheral) L97.213 Non-pressure chronic ulcer of right calf with necrosis of muscle Facility Procedures CPT4 Code: [id number] Description: 62952 - WOUND CARE VISIT-LEV 2 EST PT Modifier: Quantity: 1 Physician Procedures CPT4 Code: [id number] Description: 99213 - WC PHYS LEVEL 3 - EST PT ICD-10 Diagnosis Description L97.223 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sis of m L88 Pyoderma gangrenosum Modifier: uscle Quantity: 1 Electronic Signature(s) Signed: 04/02/2017 4:36:42 PM By: Baltazar Najjar MD Entered By: Baltazar Najjar on 04/02/2017 09:51:19

## 2017-04-04 ENCOUNTER — Encounter: Payer: Self-pay | Admitting: Primary Care

## 2017-04-14 ENCOUNTER — Telehealth: Payer: Self-pay | Admitting: Emergency Medicine

## 2017-04-14 NOTE — Telephone Encounter (Signed)
Lost to followup 

## 2017-04-15 ENCOUNTER — Ambulatory Visit
Admission: RE | Admit: 2017-04-15 | Discharge: 2017-04-15 | Disposition: A | Discharge status: Home / Self Care | Payer: Commercial Managed Care - PPO | Attending: Dermatology | Admitting: Dermatology

## 2017-04-15 DIAGNOSIS — L88 Pyoderma gangrenosum: Principal | ICD-10-CM

## 2017-04-16 ENCOUNTER — Encounter: Payer: 59 | Admitting: Internal Medicine

## 2017-04-16 DIAGNOSIS — S81802A Unspecified open wound, left lower leg, initial encounter: Secondary | ICD-10-CM | POA: Diagnosis not present

## 2017-04-16 DIAGNOSIS — T8189XA Other complications of procedures, not elsewhere classified, initial encounter: Secondary | ICD-10-CM | POA: Diagnosis not present

## 2017-04-16 DIAGNOSIS — L88 Pyoderma gangrenosum: Secondary | ICD-10-CM | POA: Diagnosis not present

## 2017-04-16 DIAGNOSIS — L97223 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sis of muscle: Secondary | ICD-10-CM | POA: Diagnosis not present

## 2017-04-17 NOTE — Progress Notes (Signed)
[name] ([id number]) Visit Report for 04/16/2017 Arrival Information Details Patient Name: [name] Date of Service: 04/16/2017 8:15 AM Medical Record Number: [id number] Patient Account Number: [id number] Date of Birth/Sex: 09-25-1980 (36 y.o. Male) Treating RN: [name] Primary Care [name]: [name] Other Clinician: Referring [name]: [name] Treating [name]/Extender: [name] in Treatment: 93 Visit Information History Since Last Visit All ordered tests and consults were completed: No Patient Arrived: Ambulatory Added or deleted any medications: No Arrival Time: 08:15 Any new allergies or adverse reactions: No Accompanied By: self Had a fall or experienced change in No Transfer Assistance: None activities of daily living that may affect Patient Requires Transmission-Based No risk of falls: Precautions: Signs or symptoms of abuse/neglect since last visito No Patient Has Alerts: No Hospitalized since last visit: No Has Dressing in Place as Prescribed: Yes Has Compression in Place as Prescribed: Yes Pain Present Now: No Electronic Signature(s) Signed: 04/16/2017 4:54:07 PM By: [name] Entered By: [name] on 04/16/2017 08:20:27 [name] ([id number]) -------------------------------------------------------------------------------- Clinic Level of Care Assessment Details Patient Name: [name] Date of Service: 04/16/2017 8:15 AM Medical Record Number: [id number] Patient Account Number: [id number] Date of Birth/Sex: July 31, 1980 (36 y.o. Male) Treating RN: [name] Primary Care Ronn Smolinsky: [name] Other Clinician: Referring [name]: [name] Treating [name]/Extender: [name] in Treatment: 19 Clinic Level of Care Assessment Items TOOL 4 Quantity Score []  - Use when only an EandM is performed on FOLLOW-UP visit 0 ASSESSMENTS - Nursing Assessment /  Reassessment []  - Reassessment of Co-morbidities (includes updates in patient status) 0 X- 1 5 Reassessment of Adherence to Treatment Plan ASSESSMENTS - Wound and Skin Assessment / Reassessment X - Simple Wound Assessment / Reassessment - one wound 1 5 []  - 0 Complex Wound Assessment / Reassessment - multiple wounds []  - 0 Dermatologic / Skin Assessment (not related to wound area) ASSESSMENTS - Focused Assessment []  - Circumferential Edema Measurements - multi extremities 0 []  - 0 Nutritional Assessment / Counseling / Intervention []  - 0 Lower Extremity Assessment (monofilament, tuning fork, pulses) []  - 0 Peripheral Arterial Disease Assessment (using hand held doppler) ASSESSMENTS - Ostomy and/or Continence Assessment and Care []  - Incontinence Assessment and Management 0 []  - 0 Ostomy Care Assessment and Management (repouching, etc.) PROCESS - Coordination of Care X - Simple Patient / Family Education for ongoing care 1 15 []  - 0 Complex (extensive) Patient / Family Education for ongoing care []  - 0 Staff obtains Programmer, systems, Records, Test Results / Process Orders []  - 0 Staff telephones HHA, Nursing Homes / Clarify orders / etc []  - 0 Routine Transfer to another Facility (non-emergent condition) []  - 0 Routine Hospital Admission (non-emergent condition) []  - 0 New Admissions / Biomedical engineer / Ordering NPWT, Apligraf, etc. []  - 0 Emergency Hospital Admission (emergent condition) X- 1 10 Simple Discharge Coordination [name] ([id number]) []  - 0 Complex (extensive) Discharge Coordination PROCESS - Special Needs []  - Pediatric / Minor Patient Management 0 []  - 0 Isolation Patient Management []  - 0 Hearing / Language / Visual special needs []  - 0 Assessment of Community assistance (transportation, D/C planning, etc.) []  - 0 Additional assistance / Altered mentation []  - 0 Support Surface(s) Assessment (bed, cushion, seat, etc.) INTERVENTIONS - Wound  Cleansing / Measurement X - Simple Wound Cleansing - one wound 1 5 []  - 0 Complex Wound Cleansing - multiple wounds X- 1 5 Wound Imaging (photographs - any number of wounds) []  - 0 Wound  Tracing (instead of photographs) X- 1 5 Simple Wound Measurement - one wound []  - 0 Complex Wound Measurement - multiple wounds INTERVENTIONS - Wound Dressings []  - Small Wound Dressing one or multiple wounds 0 X- 1 15 Medium Wound Dressing one or multiple wounds []  - 0 Large Wound Dressing one or multiple wounds []  - 0 Application of Medications - topical []  - 0 Application of Medications - injection INTERVENTIONS - Miscellaneous []  - External ear exam 0 []  - 0 Specimen Collection (cultures, biopsies, blood, body fluids, etc.) []  - 0 Specimen(s) / Culture(s) sent or taken to Lab for analysis []  - 0 Patient Transfer (multiple staff / Civil Service fast streamer / Similar devices) []  - 0 Simple Staple / Suture removal (25 or less) []  - 0 Complex Staple / Suture removal (26 or more) []  - 0 Hypo / Hyperglycemic Management (close monitor of Blood Glucose) []  - 0 Ankle / Brachial Index (ABI) - do not check if billed separately X- 1 5 Vital Signs [name] ([id number]) Has the patient been seen at the hospital within the last three years: Yes Total Score: 70 Level Of Care: New/Established - Level 2 Electronic Signature(s) Signed: 04/16/2017 5:22:25 PM By: [name] Previous Signature: 04/16/2017 4:54:07 PM Version By: [name] Entered By: [name] on 04/16/2017 17:03:49 [name] ([id number]) -------------------------------------------------------------------------------- Encounter Discharge Information Details Patient Name: [name] Date of Service: 04/16/2017 8:15 AM Medical Record Number: [id number] Patient Account Number: [id number] Date of Birth/Sex: 1980-07-24 (36 y.o. Male) Treating RN: [name] Primary Care [name]: [name] Other  Clinician: Referring [name]: [name] Treating [name]/Extender: [name] in Treatment: 21 Encounter Discharge Information Items Discharge Pain Level: 0 Discharge Condition: Stable Ambulatory Status: Ambulatory Discharge Destination: Home Private Transportation: Auto Accompanied By: self Schedule Follow-up Appointment: Yes Medication Reconciliation completed and provided No to Patient/Care Cruze Zingaro: Clinical Summary of Care: Electronic Signature(s) Signed: 04/16/2017 4:54:07 PM By: [name] Entered By: [name] on 04/16/2017 09:11:41 Tedrick, [name] ([id number]) -------------------------------------------------------------------------------- Lower Extremity Assessment Details Patient Name: [name] Date of Service: 04/16/2017 8:15 AM Medical Record Number: [id number] Patient Account Number: [id number] Date of Birth/Sex: [birth year] (36 y.o. Male) Treating RN: [name] Primary Care Melaine Mcphee: [name] Other Clinician: Referring Shriyan Arakawa: [name] Treating Micaila Ziemba/Extender: Ricard Dillon Weeks in Treatment: 19 Edema Assessment Assessed: [Left: No] [Right: No] Edema: [Left: Yes] [Right: Yes] Vascular Assessment Claudication: Claudication Assessment [Left:Intermittent] [Right:None] Pulses: Dorsalis Pedis Palpable: [Left:Yes] [Right:Yes] Posterior Tibial Extremity colors, hair growth, and conditions: Extremity Color: [Left:Hyperpigmented] [Right:Normal] Hair Growth on Extremity: [Left:Yes] [Right:Yes] Temperature of Extremity: [Left:Cool] [Right:Cool] Capillary Refill: [Left:< 3 seconds] [Right:< 3 seconds] Toe Nail Assessment Left: Right: Thick: No No Discolored: No No Deformed: No No Improper Length and Hygiene: No No Electronic Signature(s) Signed: 04/16/2017 4:54:07 PM By: [name] Entered By: [name] on 04/16/2017 08:34:02 Delrossi, [name]  ([id number]) -------------------------------------------------------------------------------- Multi Wound Chart Details Patient Name: [name] Date of Service: 04/16/2017 8:15 AM Medical Record Number: [id number] Patient Account Number: [id number] Date of Birth/Sex: 1981-05-20 (36 y.o. Male) Treating RN: [name] Primary Care Jadia Capers: [name] Other Clinician: Referring Mamadou Breon: [name] Treating Ondine Gemme/Extender: Ricard Dillon Weeks in Treatment: 19 Vital Signs Height(in): 71 Pulse(bpm): 88 Weight(lbs): 338 Blood Pressure(mmHg): 156/70 Body Mass Index(BMI): 47 Temperature(F): 97.9 Respiratory Rate 18 (breaths/min): Photos: [N/A:N/A] Wound Location: Left Lower Leg - Lateral Right Lower Leg - Posterior N/A Wounding Event: Gradually Appeared Surgical Injury N/A Primary Etiology: Pyoderma Open Surgical Wound N/A Comorbid History: Sleep Apnea, Hypertension,  Sleep Apnea, Hypertension, N/A Colitis Colitis Date Acquired: 11/18/2015 02/02/2017 N/A Weeks of Treatment: 19 10 N/A Wound Status: Open Open N/A Measurements L x W x D 6.1x5.8x0.3 1.1x0.9x0.2 N/A (cm) Area (cm) : 27.787 0.778 N/A Volume (cm) : 8.336 0.156 N/A % Reduction in Area: -466.00% -57.20% N/A % Reduction in Volume: -112.30% -57.60% N/A Classification: Full Thickness Without Partial Thickness N/A Exposed Support Structures Exudate Amount: Large Medium N/A Exudate Type: Serosanguineous Serosanguineous N/A Exudate Color: red, brown red, brown N/A Foul Odor After Cleansing: Yes No N/A Odor Anticipated Due to No N/A N/A Product Use: Wound Margin: Distinct, outline attached Distinct, outline attached N/A Granulation Amount: Small (1-33%) Small (1-33%) N/A Granulation Quality: Red Red N/A Necrotic Amount: Large (67-100%) Large (67-100%) N/A Exposed Structures: Fat Layer (Subcutaneous Fat Layer (Subcutaneous N/A Tissue) Exposed: Yes Tissue) Exposed: Yes Fascia: No Fascia:  No Tendon: No Tendon: No Muscle: No Muscle: No Granade, Jon ([id number]) Joint: No Joint: No Bone: No Bone: No Epithelialization: None None N/A Periwound Skin Texture: Induration: Yes Excoriation: No N/A Excoriation: No Induration: No Callus: No Callus: No Crepitus: No Crepitus: No Rash: No Rash: No Scarring: No Scarring: No Periwound Skin Moisture: Maceration: No Maceration: No N/A Dry/Scaly: No Dry/Scaly: No Periwound Skin Color: Erythema: Yes Erythema: Yes N/A Atrophie Blanche: No Atrophie Blanche: No Cyanosis: No Cyanosis: No Ecchymosis: No Ecchymosis: No Hemosiderin Staining: No Hemosiderin Staining: No Mottled: No Mottled: No Pallor: No Pallor: No Rubor: No Rubor: No Erythema Location: Circumferential Circumferential N/A Erythema Change: Increased N/A N/A Temperature: No Abnormality No Abnormality N/A Tenderness on Palpation: Yes Yes N/A Wound Preparation: Ulcer Cleansing: Ulcer Cleansing: N/A Rinsed/Irrigated with Saline Rinsed/Irrigated with Saline Topical Anesthetic Applied: Topical Anesthetic Applied: None, Other: Lidocaine 4% None, Other: Lidocaine 4% Treatment Notes Electronic Signature(s) Signed: 04/16/2017 5:42:06 PM By: Linton Ham MD Entered By: Linton Ham on 04/16/2017 08:52:50 [name] ([id number]) -------------------------------------------------------------------------------- Multi-Disciplinary Care Plan Details Patient Name: [name] Date of Service: 04/16/2017 8:15 AM Medical Record Number: [id number] Patient Account Number: [id number] Date of Birth/Sex: 05-29-1980 (36 y.o. Male) Treating RN: [name] Primary Care Reshma Hoey: [name] Other Clinician: Referring Yuritzi Kamp: [name] Treating Kesi Perrow/Extender: Ricard Dillon Weeks in Treatment: 26 Active Inactive ` Orientation to the Wound Care Program Nursing Diagnoses: Knowledge deficit related to the wound healing center  program Goals: Patient/caregiver will verbalize understanding of the [place] Program Date Initiated: 12/11/2016 Target Resolution Date: 03/13/2017 Goal Status: Active Interventions: Provide education on orientation to the wound center Notes: ` Venous Leg Ulcer Nursing Diagnoses: Knowledge deficit related to disease process and management Potential for venous Insuffiency (use before diagnosis confirmed) Goals: Non-invasive venous studies are completed as ordered Date Initiated: 12/11/2016 Target Resolution Date: 03/13/2017 Goal Status: Active Patient will maintain optimal edema control Date Initiated: 12/11/2016 Target Resolution Date: 03/13/2017 Goal Status: Active Patient/caregiver will verbalize understanding of disease process and disease management Date Initiated: 12/11/2016 Target Resolution Date: 03/13/2017 Goal Status: Active Verify adequate tissue perfusion prior to therapeutic compression application Date Initiated: 12/11/2016 Target Resolution Date: 03/13/2017 Goal Status: Active Interventions: Assess peripheral edema status every visit. Compression as ordered Provide education on venous insufficiency Treatment Activities: JEHU, MCCAUSLIN ([id number]) Therapeutic compression applied : 12/11/2016 Notes: ` Wound/Skin Impairment Nursing Diagnoses: Impaired tissue integrity Knowledge deficit related to ulceration/compromised skin integrity Goals: Patient/caregiver will verbalize understanding of skin care regimen Date Initiated: 12/11/2016 Target Resolution Date: 03/13/2017 Goal Status: Active Ulcer/skin breakdown will have a volume reduction of 30% by week 4 Date Initiated: 12/11/2016 Target  Resolution Date: 03/13/2017 Goal Status: Active Ulcer/skin breakdown will have a volume reduction of 50% by week 8 Date Initiated: 12/11/2016 Target Resolution Date: 03/13/2017 Goal Status: Active Ulcer/skin breakdown will have a volume reduction of 80% by week  12 Date Initiated: 12/11/2016 Target Resolution Date: 03/13/2017 Goal Status: Active Ulcer/skin breakdown will heal within 14 weeks Date Initiated: 12/11/2016 Target Resolution Date: 03/13/2017 Goal Status: Active Interventions: Assess patient/caregiver ability to obtain necessary supplies Assess patient/caregiver ability to perform ulcer/skin care regimen upon admission and as needed Assess ulceration(s) every visit Provide education on ulcer and skin care Treatment Activities: Skin care regimen initiated : 12/11/2016 Topical wound management initiated : 12/11/2016 Notes: Electronic Signature(s) Signed: 04/16/2017 4:54:07 PM By: [name] Entered By: [name] on 04/16/2017 08:34:08 [name] ([id number]) -------------------------------------------------------------------------------- Pain Assessment Details Patient Name: [name] Date of Service: 04/16/2017 8:15 AM Medical Record Number: [id number] Patient Account Number: [id number] Date of Birth/Sex: 1981-04-17 (36 y.o. Male) Treating RN: [name] Primary Care Isadora Delorey: [name] Other Clinician: Referring Janeann Paisley: [name] Treating Ladislao Cohenour/Extender: Ricard Dillon Weeks in Treatment: 19 Active Problems Location of Pain Severity and Description of Pain Patient Has Paino No Site Locations Pain Management and Medication Current Pain Management: Electronic Signature(s) Signed: 04/16/2017 4:54:07 PM By: [name] Entered By: [name] on 04/16/2017 08:18:41 [name] ([id number]) -------------------------------------------------------------------------------- Patient/Caregiver Education Details Patient Name: [name] Date of Service: 04/16/2017 8:15 AM Medical Record Number: [id number] Patient Account Number: [id number] Date of Birth/Gender: Aug 11, 1980 (36 y.o. Male) Treating RN: [name] Primary Care Physician: [name] Other  Clinician: Referring Physician: Alma [name] Treating Physician/Extender: [name] in Treatment: 80 Education Assessment Education Provided To: Patient Education Topics Provided Wound/Skin Impairment: Handouts: Caring for Your Ulcer Methods: Explain/Verbal Responses: State content correctly Electronic Signature(s) Signed: 04/16/2017 4:54:07 PM By: [name] Entered By: [name] on 04/16/2017 09:11:52 Rahe, [name] ([id number]) -------------------------------------------------------------------------------- Wound Assessment Details Patient Name: [name] Date of Service: 04/16/2017 8:15 AM Medical Record Number: [id number] Patient Account Number: [id number] Date of Birth/Sex: 1980/11/10 (36 y.o. Male) Treating RN: [name] Primary Care Burhanuddin Kohlmann: [name] Other Clinician: Referring Camilo Mander: [name] Treating Sasha Rogel/Extender: Ricard Dillon Weeks in Treatment: 19 Wound Status Wound Number: 1 Primary Etiology: Pyoderma Wound Location: Left Lower Leg - Lateral Wound Status: Open Wounding Event: Gradually Appeared Comorbid History: Sleep Apnea, Hypertension, Colitis Date Acquired: 11/18/2015 Weeks Of Treatment: 19 Clustered Wound: No Photos Wound Measurements Length: (cm) 6.1 Width: (cm) 5.8 Depth: (cm) 0.3 Area: (cm) 27.787 Volume: (cm) 8.336 % Reduction in Area: -466% % Reduction in Volume: -112.3% Epithelialization: None Tunneling: No Undermining: No Wound Description Full Thickness Without Exposed Support Classification: Structures Wound Margin: Distinct, outline attached Exudate Large Amount: Exudate Type: Serosanguineous Exudate Color: red, brown Foul Odor After Cleansing: Yes Due to Product Use: No Slough/Fibrino Yes Wound Bed Granulation Amount: Small (1-33%) Exposed Structure Granulation Quality: Red Fascia Exposed: No Necrotic Amount: Large (67-100%) Fat Layer (Subcutaneous  Tissue) Exposed: Yes Necrotic Quality: Adherent Slough Tendon Exposed: No Muscle Exposed: No Joint Exposed: No Bone Exposed: No Periwound Skin Texture Texture Color No Abnormalities Noted: No No Abnormalities Noted: No Sprinkle, Donley ([id number]) Callus: No Atrophie Blanche: No Crepitus: No Cyanosis: No Excoriation: No Ecchymosis: No Induration: Yes Erythema: Yes Rash: No Erythema Location: Circumferential Scarring: No Erythema Change: Increased Hemosiderin Staining: No Moisture Mottled: No No Abnormalities Noted: No Pallor: No Dry / Scaly: No Rubor: No Maceration: No Temperature / Pain Temperature: No Abnormality Tenderness on Palpation: Yes Wound  Preparation Ulcer Cleansing: Rinsed/Irrigated with Saline Topical Anesthetic Applied: None, Other: Lidocaine 4%, Treatment Notes Wound #1 (Left, Lateral Lower Leg) 1. Cleansed with: Clean wound with Normal Saline 2. Anesthetic Topical Lidocaine 4% cream to wound bed prior to debridement 4. Dressing Applied: Other dressing (specify in notes) 5. Secondary Dressing Applied Bordered Foam Dressing Notes triple antibiotic cream to open wounds Electronic Signature(s) Signed: 04/16/2017 4:54:07 PM By: [name] Entered By: [name] on 04/16/2017 08:31:39 Hattery, [name] ([id number]) -------------------------------------------------------------------------------- Wound Assessment Details Patient Name: [name] Date of Service: 04/16/2017 8:15 AM Medical Record Number: [id number] Patient Account Number: [id number] Date of Birth/Sex: [birth year] (36 y.o. Male) Treating RN: [name] Primary Care Constant [place]: [name] Other Clinician: Referring Attikus Bartoszek: [name] Treating Kiet Geer/Extender: Ricard Dillon Weeks in Treatment: 19 Wound Status Wound Number: 2 Primary Etiology: Open Surgical Wound Wound Location: Right Lower Leg - Posterior Wound Status: Open Wounding Event: Surgical  Injury Comorbid History: Sleep Apnea, Hypertension, Colitis Date Acquired: 02/02/2017 Weeks Of Treatment: 10 Clustered Wound: No Photos Wound Measurements Length: (cm) 1.1 Width: (cm) 0.9 Depth: (cm) 0.2 Area: (cm) 0.778 Volume: (cm) 0.156 % Reduction in Area: -57.2% % Reduction in Volume: -57.6% Epithelialization: None Tunneling: No Undermining: No Wound Description Classification: Partial Thickness Wound Margin: Distinct, outline attached Exudate Amount: Medium Exudate Type: Serosanguineous Exudate Color: red, brown Foul Odor After Cleansing: No Slough/Fibrino Yes Wound Bed Granulation Amount: Small (1-33%) Exposed Structure Granulation Quality: Red Fascia Exposed: No Necrotic Amount: Large (67-100%) Fat Layer (Subcutaneous Tissue) Exposed: Yes Necrotic Quality: Adherent Slough Tendon Exposed: No Muscle Exposed: No Joint Exposed: No Bone Exposed: No Periwound Skin Texture Texture Color No Abnormalities Noted: No No Abnormalities Noted: No Callus: No Atrophie Blanche: No Crepitus: No Cyanosis: No Henrie, Vito ([id number]) Excoriation: No Ecchymosis: No Induration: No Erythema: Yes Rash: No Erythema Location: Circumferential Scarring: No Hemosiderin Staining: No Mottled: No Moisture Pallor: No No Abnormalities Noted: No Rubor: No Dry / Scaly: No Maceration: No Temperature / Pain Temperature: No Abnormality Tenderness on Palpation: Yes Wound Preparation Ulcer Cleansing: Rinsed/Irrigated with Saline Topical Anesthetic Applied: None, Other: Lidocaine 4%, Treatment Notes Wound #2 (Right, Posterior Lower Leg) 1. Cleansed with: Clean wound with Normal Saline 2. Anesthetic Topical Lidocaine 4% cream to wound bed prior to debridement 4. Dressing Applied: Other dressing (specify in notes) 5. Secondary Dressing Applied Bordered Foam Dressing Notes triple antibiotic cream to open wounds Electronic Signature(s) Signed: 04/16/2017 4:54:07 PM By:  [name] Entered By: [name] on 04/16/2017 08:32:28 [name] ([id number]) -------------------------------------------------------------------------------- Muse Details Patient Name: [name] Date of Service: 04/16/2017 8:15 AM Medical Record Number: [id number] Patient Account Number: [id number] Date of Birth/Sex: 03-31-1981 (36 y.o. Male) Treating RN: [name] Primary Care Jeanmarc Viernes: [name] Other Clinician: Referring Colleena Kurtenbach: [name] Treating Rainna Nearhood/Extender: [name] in Treatment: 19 Vital Signs Time Taken: 08:15 Temperature (F): 97.9 Height (in): 71 Pulse (bpm): 88 Weight (lbs): 338 Respiratory Rate (breaths/min): 18 Body Mass Index (BMI): 47.1 Blood Pressure (mmHg): 156/70 Reference Range: 80 - 120 mg / dl Electronic Signature(s) Signed: 04/16/2017 4:54:07 PM By: [name] Entered By: [name] on 04/16/2017 08:21:51

## 2017-04-17 NOTE — Progress Notes (Signed)
SELIG, WAMPOLE ([id number]) Visit Report for 04/16/2017 HPI Details Patient Name: [name], [name] Date of Service: 04/16/2017 8:15 AM Medical Record Number: [id number] Patient Account Number: [id number] Date of Birth/Sex: [birth year] (36 y.o. Male) Treating RN: Renne Crigler Primary Care Provider: [name] Other Clinician: Referring Provider: [name] Treating Provider/Extender: Maxwell Caul Weeks in Treatment: 6 History of Present Illness HPI Description: 12/04/16; 36 year old man who comes into the clinic today for review of a wound on the posterior left calf. He tells me that is been there for about a year. He is not a diabetic he does smoke half a pack per day. He was seen in the ER on 11/20/16 felt to have cellulitis around the wound and was given clindamycin. An x-ray did not show osteomyelitis. The patient initially tells me that he has a milk allergy that sets off a pruritic itching rash on his lower legs which she scratches incessantly and he thinks that's what may have set up the wound. He has been using various topical antibiotics and ointments without any effect. He works in a [employer]ing [employer] and is on his feet all day. He does not have a prior history of wounds however he does have the rash on both lower legs the right arm and the ventral aspect of his left arm. These are excoriations and clearly have had scratching however there are of macular looking areas on both legs including a substantial larger area on the right leg. This does not have an underlying open area. There is no blistering. 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 He seemed to recognize this. 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 He is not in any treatment for this. He's had no  recent travel 12/11/16; the patient arrives today with his wound and roughly the same condition we've been using silver alginate this is a deep punched out wound with some surrounding erythema but no tenderness. Biopsy I did did not show confirmed pyoderma gangrenosum suggested nonspecific inflammation and vasculitis but does not provide an actual description of what was seen by the pathologist. I'm really not able to understand this We have also received information from the patient's dermatologist in [place] notes from April 2016. This was a doctor Agarwal- antal. 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 He was prescribed topical steroid high potency under occlusion which helped but at this point the patient did not have a deep punched out wound. 12/18/16; the patient's wound is larger in terms of surface area however this surface looks better and there is less depth. The surrounding erythema also is better. The patient states that the wrap we put on came off 2 days ago when he has been using his compression stockings. He we are in the process of getting a dermatology consult. 12/26/16 on evaluation today patient's left lower extremity wound shows evidence of infection with surrounding erythema noted. He has been tolerating the dressing changes but states that he has noted more discomfort. There is a larger area of erythema surrounding the wound. No fevers, chills, nausea, or vomiting noted at this time. With that being said the wound still does have slough covering the surface. He is not allergic to any medication that he is aware of at this  point. In regard to his right lower extremity he had several regions that are erythematous and pruritic he wonders if there's anything we can do to help that. 01/02/17 I reviewed patient's wound culture which was obtained his visit last week. He was placed on doxycycline at that point. Unfortunately that does not appear to be an antibiotic that  would likely help with the situation however the pseudomonas noted on culture is sensitive to Cipro. Also unfortunately patient's wound seems to have a large compared to last week's evaluation. Not severely so but there are definitely increased measurements in general. He is continuing to have discomfort as well he writes this to be a seven out of 10. In fact he would prefer me not to perform any debridement today due to the fact that he is having discomfort and considering he has an active infection on the little reluctant to do so anyway. No fevers, chills, nausea, or vomiting noted at this time. 01/08/17; patient seems dermatology on September 5. I suspect dermatology will want the slides from the biopsy I did sent to their pathologist. I'm not sure if there is a way we can expedite that. In any case the culture I did before I left on vacation 3 [name], [name] ([id number]) weeks ago showed Pseudomonas he was given 10 days of Cipro and per her description of her intake nurses is actually somewhat better this week although the wound is quite a bit bigger than I remember the last time I saw this. He still has 3 more days of Cipro 01/21/17; dermatology appointment tomorrow. He has completed the ciprofloxacin for Pseudomonas. Surface of the wound looks better however he is had some deterioration in the lesions on his right leg. Meantime the left lateral leg wound we will continue with sample 01/29/17; patient had his dermatology appointment but I can't yet see that note. He is completed his antibiotics. The wound is more superficial but considerably larger in circumferential area than when he came in. This is in his left lateral calf. He also has swollen erythematous areas with superficial wounds on the right leg and small papular areas on both arms. There apparently areas in her his upper thighs and buttocks I did not look at those. Dermatology biopsied the right leg. Hopefully will have their input next  week. 02/05/17; patient went back to see his dermatologist who told him that he had a "scratching problem" as well as staph. He is now on a 30 day course of doxycycline and I believe she gave him triamcinolone cream to the right leg areas to help with the itching [not exactly sure but probably triamcinolone]. She apparently looked at the left lateral leg wound although this was not rebiopsied and I think felt to be ultimately part of the same pathogenesis. He is using sample border foam and changing nevus himself. He now has a new open area on the right posterior leg which was his biopsy site I don't have any of the dermatology notes 02/12/17; we put the patient in compression last week with SANTYL to the wound on the left leg and the biopsy. Edema is much better and the depth of the wound is now at level of skin. Area is still the same oBiopsy site on the right lateral leg we've also been using santyl with a border foam dressing and he is changing this himself. 02/19/17; Using silver alginate started last week to both the substantial left leg wound and the biopsy site on the right wound.  He is tolerating compression well. Has a an appointment with his primary M.D. tomorrow wondering about diuretics although I'm wondering if the edema problem is actually lymphedema 02/26/17; the patient has been to see his primary doctor Dr. Jerelyn Charles at [facility] our primary care. She started him on Lasix 20 mg and this seems to have helped with the edema. However we are not making substantial change with the left lateral calf wound and inflammation. The biopsy site on the right leg also looks stable but not really all that different. 03/12/17; the patient has been to see vein and vascular Dr. Wyn Quaker. He has had venous reflux studies I have not reviewed these. I did get a call from his dermatology office. They felt that he might have pathergy based on their biopsy on his right leg which led them to look at the slides  of the biopsy I did on the left leg and they wonder whether this represents pyoderma gangrenosum which was the original supposition in a man with ulcerative colitis albeit inactive for many years. They therefore recommended clobetasol and tetracycline i.e. 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 03/26/17; apparently the patient just had reflux studies not an appointment with Dr. dew. She arrives in clinic today having applied clobetasol for 2-3 weeks. He notes over the last 2-3 days excessive drainage having to change the dressing 3-4 times a day and also expanding erythema. 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 Carles Collet at [facility] dermatology locally who kindly saw him at my request. 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 My concern was the degree of erythema medially extending from his left leg wound which was either inflammation from pyoderma or cellulitis. I put him on Augmentin however culture of the wound showed Pseudomonas which is quinolone sensitive. I really don't believe he has cellulitis however in view of everything I will continue and give him a course of Cipro. He is also on doxycycline as an immune modulator for the pyoderma. In addition to his original wound on the left lateral leg with surrounding erythema he has a wound on the right posterior calf which was an original biopsy site done by dermatology. 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 Saw Dr. Carles Collet yesterday. He has been using topical antibiotics to both wound areas his original wound on the left and the biopsies/pathergy area on  the right. There is definitely some improvement in the inflammation around the wound on the right although the patient states he has increasing sensitivity of the wounds. He is on prednisone 60 and doxycycline 1 as prescribed by Dr. Carles Collet. He is covering the topical antibiotic with gauze and putting this in his own compression stocks and changing this daily. He states that Dr. Aquilla Hacker did a culture of the left leg wound yesterday Electronic Signature(s) Signed: 04/16/2017 5:42:06 PM By: Baltazar Najjar MD Entered By: Baltazar Najjar on 04/16/2017 08:56:46 [name] ([id number]) -------------------------------------------------------------------------------- Physical Exam Details Patient Name: [name] Date of Service: 04/16/2017 8:15 AM Medical Record Number: [id number] Patient Account Number: [id number] Date of Birth/Sex: 01-17-1981 (36 y.o. Male) Treating RN: Renne Crigler Primary Care Provider: [name] Other Clinician: Referring Provider: [name]  Treating Provider/Extender: Maxwell Caul Weeks in Treatment: 19 Constitutional Patient is hypertensive.. Pulse regular and within target range for patient.Marland Kitchen Respirations regular, non-labored and within target range.. Temperature is normal and within the target range for the patient.Marland Kitchen appears in no distress. Notes Wound exam; left lateral leg wound has a better looking surface of this although there is still a lot of slough. Because of underlying pathergy in pyoderma no debridement at this point. oSmall wound on the right leg and this actually looks better. There is some surface slough but no debridement at this point for the same reason as above the erythema around the wound on the left looks a lot better also the right. Presumably this is the steroid Electronic Signature(s) Signed: 04/16/2017 5:42:06 PM By: Baltazar Najjar MD Entered By: Baltazar Najjar on 04/16/2017 08:56:21 [name]  ([id number]) -------------------------------------------------------------------------------- Physician Orders Details Patient Name: [name] Date of Service: 04/16/2017 8:15 AM Medical Record Number: [id number] Patient Account Number: [id number] Date of Birth/Sex: [birth year] (36 y.o. Male) Treating RN: Renne Crigler Primary Care Provider: [name] Other Clinician: Referring Provider: [name] Treating Provider/Extender: Altamese [place] in Treatment: 80 Verbal / Phone Orders: No Diagnosis Coding Wound Cleansing Wound #1 Left,Lateral Lower Leg o Clean wound with Normal Saline. Wound #2 Right,Posterior Lower Leg o Clean wound with Normal Saline. Anesthetic Wound #1 Left,Lateral Lower Leg o Topical Lidocaine 4% cream applied to wound bed prior to debridement Wound #2 Right,Posterior Lower Leg o Topical Lidocaine 4% cream applied to wound bed prior to debridement Primary Wound Dressing Wound #1 Left,Lateral Lower Leg o Other: - triple antibiotic cream to open wound area Wound #2 Right,Posterior Lower Leg o Other: - triple antibiotic cream Secondary Dressing Wound #1 Left,Lateral Lower Leg o Other - telfa Wound #2 Right,Posterior Lower Leg o Other - telfa Dressing Change Frequency Wound #1 Left,Lateral Lower Leg o Change dressing every day. Wound #2 Right,Posterior Lower Leg o Change dressing every day. Follow-up Appointments Wound #1 Left,Lateral Lower Leg o Return Appointment in: - 3 weeks Wound #2 Right,Posterior Lower Leg o Return Appointment in: - 3 weeks Edema Control Hupfer, Cal ([id number]) Wound #1 Left,Lateral Lower Leg o Patient to wear own compression stockings Wound #2 Right,Posterior Lower Leg o Patient to wear own compression stockings Medications-please add to medication list. Wound #1 Left,Lateral Lower Leg o P.O. Antibiotics - Continue Doxy, Wound #2 Right,Posterior Lower Leg o P.O.  Antibiotics - Continue Doxy, Electronic Signature(s) Signed: 04/16/2017 4:54:07 PM By: Renne Crigler Signed: 04/16/2017 5:42:06 PM By: Baltazar Najjar MD Entered By: Renne Crigler on 04/16/2017 08:56:55 [name], [name] ([id number]) -------------------------------------------------------------------------------- Problem List Details Patient Name: [name] Date of Service: 04/16/2017 8:15 AM Medical Record Number: [id number] Patient Account Number: [id number] Date of Birth/Sex: 1981/03/12 (36 y.o. 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 12/04/2016 Yes L88 Pyoderma gangrenosum 03/26/2017 Yes I87.2 Venous insufficiency (chronic) (peripheral) 12/04/2016 Yes L97.213 Non-pressure chronic ulcer of right calf with necrosis of muscle 04/02/2017 Yes Inactive Problems Resolved Problems Electronic Signature(s) Signed: 04/16/2017 5:42:06 PM By: Baltazar Najjar MD Entered By: Baltazar Najjar on 04/16/2017 08:52:39 [name] ([id number]) -------------------------------------------------------------------------------- Progress Note Details Patient Name: [name] Date of Service: 04/16/2017 8:15 AM Medical Record Number: [id number] Patient Account Number: [id number] Date of Birth/Sex: [birth year] (36 y.o. Male) Treating  RN: Renne Crigler Primary Care Provider: [name] Other Clinician: Referring Provider: [name] Treating Provider/Extender: Altamese [place] in Treatment: 19 Subjective History of Present Illness (HPI) 12/04/16; 36 year old man who comes into the clinic today for review of a wound on the posterior left calf. He tells me that is been there for  about a year. He is not a diabetic he does smoke half a pack per day. He was seen in the ER on 11/20/16 felt to have cellulitis around the wound and was given clindamycin. An x-ray did not show osteomyelitis. The patient initially tells me that he has a milk allergy that sets off a pruritic itching rash on his lower legs which she scratches incessantly and he thinks that's what may have set up the wound. He has been using various topical antibiotics and ointments without any effect. He works in a [employer]ing [employer] and is on his feet all day. He does not have a prior history of wounds however he does have the rash on both lower legs the right arm and the ventral aspect of his left arm. These are excoriations and clearly have had scratching however there are of macular looking areas on both legs including a substantial larger area on the right leg. This does not have an underlying open area. There is no blistering. 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 He seemed to recognize this. 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 He is not in any treatment for this. He's had no recent travel 12/11/16; the patient arrives today with his wound and roughly the same condition we've been using silver alginate this is a deep punched out wound with some surrounding erythema but no tenderness. Biopsy I did did not show confirmed pyoderma gangrenosum suggested nonspecific inflammation and vasculitis but does not provide an actual description of what was seen by the pathologist. I'm really not able to understand this We have also received information from the patient's dermatologist in [place] notes from April 2016. This was a doctor Agarwal- antal. 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  He was prescribed topical steroid high potency under occlusion which helped but at this point the patient did not have a deep punched out wound. 12/18/16; the patient's wound is larger in terms of surface area however this surface looks better and there is less depth. The surrounding erythema also is better. The patient states that the wrap we put on came off 2 days ago when he has been using his compression stockings. He we are in the process of getting a dermatology consult. 12/26/16 on evaluation today patient's left lower extremity wound shows evidence of infection with surrounding erythema noted. He has been tolerating the dressing changes but states that he has noted more discomfort. There is a larger area of erythema surrounding the wound. No fevers, chills, nausea, or vomiting noted at this time. With that being said the wound still does have slough covering the surface. He is not allergic to any medication that he is aware of at this point. In regard to his right lower extremity he had several regions that are erythematous and pruritic he wonders if there's anything we can do to help that. 01/02/17 I reviewed patient's wound culture  which was obtained his visit last week. He was placed on doxycycline at that point. Unfortunately that does not appear to be an antibiotic that would likely help with the situation however the pseudomonas noted on culture is sensitive to Cipro. Also unfortunately patient's wound seems to have a large compared to last week's evaluation. Not severely so but there are definitely increased measurements in general. He is continuing to have discomfort as well he writes this to be a seven out of 10. In fact he would prefer me not to perform any debridement today due to the fact that he is having discomfort and considering he has an active infection on the little reluctant to do so anyway. No fevers, chills, nausea, or vomiting noted at this time. 01/08/17; patient seems  dermatology on September 5. I suspect dermatology will want the slides from the biopsy I did sent to their pathologist. I'm not sure if there is a way we can expedite that. In any case the culture I did before I left on vacation 3 weeks ago showed Pseudomonas he was given 10 days of Cipro and per her description of her intake nurses is actually somewhat better this week although the wound is quite a bit bigger than I remember the last time I saw this. He still has 3 Redinger, Merion ([id number]) more days of Cipro 01/21/17; dermatology appointment tomorrow. He has completed the ciprofloxacin for Pseudomonas. Surface of the wound looks better however he is had some deterioration in the lesions on his right leg. Meantime the left lateral leg wound we will continue with sample 01/29/17; patient had his dermatology appointment but I can't yet see that note. He is completed his antibiotics. The wound is more superficial but considerably larger in circumferential area than when he came in. This is in his left lateral calf. He also has swollen erythematous areas with superficial wounds on the right leg and small papular areas on both arms. There apparently areas in her his upper thighs and buttocks I did not look at those. Dermatology biopsied the right leg. Hopefully will have their input next week. 02/05/17; patient went back to see his dermatologist who told him that he had a "scratching problem" as well as staph. He is now on a 30 day course of doxycycline and I believe she gave him triamcinolone cream to the right leg areas to help with the itching [not exactly sure but probably triamcinolone]. She apparently looked at the left lateral leg wound although this was not rebiopsied and I think felt to be ultimately part of the same pathogenesis. He is using sample border foam and changing nevus himself. He now has a new open area on the right posterior leg which was his biopsy site I don't have any of the  dermatology notes 02/12/17; we put the patient in compression last week with SANTYL to the wound on the left leg and the biopsy. Edema is much better and the depth of the wound is now at level of skin. Area is still the same Biopsy site on the right lateral leg we've also been using santyl with a border foam dressing and he is changing this himself. 02/19/17; Using silver alginate started last week to both the substantial left leg wound and the biopsy site on the right wound. He is tolerating compression well. Has a an appointment with his primary M.D. tomorrow wondering about diuretics although I'm wondering if the edema problem is actually lymphedema 02/26/17; the patient has been to see  his primary doctor Dr. Jerelyn CharlesKathryn Clark at [facility] our primary care. She started him on Lasix 20 mg and this seems to have helped with the edema. However we are not making substantial change with the left lateral calf wound and inflammation. The biopsy site on the right leg also looks stable but not really all that different. 03/12/17; the patient has been to see vein and vascular Dr. Wyn QuakerEW. He has had venous reflux studies I have not reviewed these. I did get a call from his dermatology office. 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 They therefore recommended clobetasol and tetracycline i.e. 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 03/26/17; apparently the patient just had reflux studies not an appointment with Dr. dew. She arrives in clinic today having applied clobetasol for 2-3 weeks. He notes over the last 2-3 days excessive drainage having to change the dressing 3-4 times a day and also expanding erythema. He states the expanding  erythema seems to come and go and was last this red was earlier in the month.he is on doxycycline 150 mg twice a day as an anti-inflammatory systemic therapy for possible pyoderma gangrenosum along with the topical clobetasol 04/02/17; the patient was seen last week [facility] who kindly saw him at my request. 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 My concern was the degree of erythema medially extending from his left leg wound which was either inflammation from pyoderma or cellulitis. I put him on Augmentin however culture of the wound showed Pseudomonas which is quinolone sensitive. I really don't believe he has cellulitis however in view of everything I will continue and give him a course of Cipro. He is also on doxycycline as an immune modulator for the pyoderma. In addition to his original wound on the left lateral leg with surrounding erythema he has a wound on the right posterior calf which was an original biopsy site done by dermatology. 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 Saw Dr. Carles ColletPearlstein yesterday. He has been using topical antibiotics to both wound areas his original wound on the left and the biopsies/pathergy area on the right. There is definitely some improvement in the inflammation around the wound on the right although the patient states he has increasing sensitivity of the wounds. He is on prednisone 60 and doxycycline 1 as prescribed by Dr. Carles ColletPearlstein. He is covering the topical antibiotic with gauze and putting this in his own compression stocks and changing this daily. He states that Dr. Aquilla HackerPerlstein did a culture of the left leg wound yesterday [name], [name] ([id number]) Objective Constitutional Patient is hypertensive.. Pulse regular and within target range for patient.Marland Kitchen. Respirations regular, non-labored and within  target range.. Temperature is normal and within the target range for the patient.Marland Kitchen. appears in no distress. Vitals Time Taken: 8:15 AM, Height: 71 in, Weight: 338 lbs, BMI: 47.1, Temperature: 97.9 F, Pulse: 88 bpm, Respiratory Rate: 18 breaths/min, Blood Pressure: 156/70 mmHg. General Notes: Wound exam; left lateral leg wound has a better looking surface of this although there is still a lot of slough. Because of underlying pathergy in pyoderma no debridement at this  point. Small wound on the right leg and this actually looks better. There is some surface slough but no debridement at this point for the same reason as above the erythema around the wound on the left looks a lot better also the right. Presumably this is the steroid Integumentary (Hair, Skin) Wound #1 status is Open. Original cause of wound was Gradually Appeared. The wound is located on the Left,Lateral Lower Leg. The wound measures 6.1cm length x 5.8cm width x 0.3cm depth; 27.787cm^2 area and 8.336cm^3 volume. There is Fat Layer (Subcutaneous Tissue) Exposed exposed. There is no tunneling or undermining noted. There is a large amount of serosanguineous drainage noted. Foul odor after cleansing was noted. The wound margin is distinct with the outline attached to the wound base. There is small (1-33%) red granulation within the wound bed. There is a large (67-100%) amount of necrotic tissue within the wound bed including Adherent Slough. The periwound skin appearance exhibited: Induration, Erythema. The periwound skin appearance did not exhibit: Callus, Crepitus, Excoriation, Rash, Scarring, Dry/Scaly, Maceration, Atrophie Blanche, Cyanosis, Ecchymosis, Hemosiderin Staining, Mottled, Pallor, Rubor. The surrounding wound skin color is noted with erythema which is circumferential. Periwound temperature was noted as No Abnormality. The periwound has tenderness on palpation. Wound #2 status is Open. Original cause of wound was Surgical  Injury. The wound is located on the Right,Posterior Lower Leg. The wound measures 1.1cm length x 0.9cm width x 0.2cm depth; 0.778cm^2 area and 0.156cm^3 volume. There is Fat Layer (Subcutaneous Tissue) Exposed exposed. There is no tunneling or undermining noted. There is a medium amount of serosanguineous drainage noted. The wound margin is distinct with the outline attached to the wound base. There is small (1-33%) red granulation within the wound bed. There is a large (67-100%) amount of necrotic tissue within the wound bed including Adherent Slough. The periwound skin appearance exhibited: Erythema. The periwound skin appearance did not exhibit: Callus, Crepitus, Excoriation, Induration, Rash, Scarring, Dry/Scaly, Maceration, Atrophie Blanche, Cyanosis, Ecchymosis, Hemosiderin Staining, Mottled, Pallor, Rubor. The surrounding wound skin color is noted with erythema which is circumferential. Periwound temperature was noted as No Abnormality. The periwound has tenderness on palpation. 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 Dr. Aquilla HackerPerlstein did a swab culture of this yesterday he has completed the antibiotics I prescribed for him last time #4 follow-up on this in 3 weeks Electronic Signature(s) Signed: 04/16/2017 5:42:06 PM By: Baltazar Najjarobson, Allayah Raineri MD Entered By: Baltazar Najjarobson, Kden Wagster on 04/16/2017 08:58:05 [name], [name] ([id number]) -------------------------------------------------------------------------------- SuperBill Details Patient Name: [name], [name] Date of Service: 04/16/2017 Medical Record  Number: [id number] Patient Account Number: [id number] Date of Birth/Sex: 10/26/1980 [age] y.o. Male) Treating RN: Renne CriglerFlinchum, Cheryl Primary Care Provider: Vernona RiegerLARK, KATHERINE Other Clinician: Referring Provider: Vernona RiegerLARK, KATHERINE Treating Provider/Extender: Maxwell CaulOBSON, Lynniah [name] G Weeks in Treatment: 19 Diagnosis Coding ICD-10 Codes Code Description [phone] 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sis of muscle L88 Pyoderma gangrenosum I87.2 Venous insufficiency (chronic) (peripheral) L97.213 Non-pressure chronic ulcer of right calf with necrosis of muscle Facility Procedures CPT4 Code: [id number] Description: [id number] - WOUND CARE VISIT-LEV 2 EST PT Modifier: Quantity: 1 Physician Procedures CPT4 Code: [id number] Description: [id number] - WC PHYS LEVEL 2 - EST PT  ICD-10 Diagnosis Description L88 Pyoderma gangrenosum L97.223 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sis of m Modifier: uscle Quantity: 1 Electronic Signature(s) Signed: 04/16/2017 4:54:07 PM By: Renne Crigler Signed: 04/16/2017 5:42:06 PM By: Baltazar Najjar MD Entered By: Renne Crigler on 04/16/2017 09:10:26

## 2017-04-22 ENCOUNTER — Encounter: Payer: Self-pay | Admitting: Primary Care

## 2017-05-02 DIAGNOSIS — G4730 Sleep apnea, unspecified: Secondary | ICD-10-CM | POA: Diagnosis not present

## 2017-05-02 DIAGNOSIS — L97222 Non-pressure chronic ulcer of left calf with fat layer exposed: Secondary | ICD-10-CM | POA: Diagnosis not present

## 2017-05-06 ENCOUNTER — Ambulatory Visit
Admission: RE | Admit: 2017-05-06 | Discharge: 2017-05-06 | Payer: Commercial Managed Care - PPO | Attending: Dermatology | Admitting: Dermatology

## 2017-05-06 DIAGNOSIS — L88 Pyoderma gangrenosum: Principal | ICD-10-CM

## 2017-05-06 MED ORDER — PREDNISONE 20 MG TABLET
ORAL_TABLET | Freq: Every day | ORAL | 1 refills | 0 days | Status: CP
Start: 2017-05-06 — End: 2017-06-03

## 2017-05-07 ENCOUNTER — Encounter: Payer: 59 | Attending: Internal Medicine | Admitting: Internal Medicine

## 2017-05-07 DIAGNOSIS — F17210 Nicotine dependence, cigarettes, uncomplicated: Secondary | ICD-10-CM | POA: Insufficient documentation

## 2017-05-07 DIAGNOSIS — L97223 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sis of muscle: Secondary | ICD-10-CM | POA: Diagnosis not present

## 2017-05-07 DIAGNOSIS — L88 Pyoderma gangrenosum: Secondary | ICD-10-CM | POA: Insufficient documentation

## 2017-05-07 DIAGNOSIS — S81802A Unspecified open wound, left lower leg, initial encounter: Secondary | ICD-10-CM | POA: Diagnosis not present

## 2017-05-07 DIAGNOSIS — T8189XA Other complications of procedures, not elsewhere classified, initial encounter: Secondary | ICD-10-CM | POA: Diagnosis not present

## 2017-05-07 DIAGNOSIS — I872 Venous insufficiency (chronic) (peripheral): Secondary | ICD-10-CM | POA: Insufficient documentation

## 2017-05-07 DIAGNOSIS — L97213 Non-pressure chronic ulcer of right calf with necrosis of muscle: Secondary | ICD-10-CM | POA: Diagnosis not present

## 2017-05-11 NOTE — Progress Notes (Signed)
[name], [name] ([id number]) Visit Report for 05/07/2017 Arrival Information Details Patient Name: [name], [name] Date of Service: 05/07/2017 8:15 AM Medical Record Number: [id number] Patient Account Number: [id number] Date of Birth/Sex: [birth year] (36 y.o. Male) Treating RN: Roger Shelter Primary Care Davon Folta: [name] Other Clinician: Referring Shepard Keltz: [name] Treating Michela Herst/Extender: Tito Dine in Treatment: 60 Visit Information History Since Last Visit All ordered tests and consults were completed: No Patient Arrived: Ambulatory Added or deleted any medications: No Arrival Time: 08:22 Any new allergies or adverse reactions: No Accompanied By: self Had a fall or experienced change in No Transfer Assistance: None activities of daily living that may affect Patient Identification Verified: Yes risk of falls: Secondary Verification Process Completed: Yes Signs or symptoms of abuse/neglect since last visito No Patient Requires Transmission-Based No Hospitalized since last visit: No Precautions: Pain Present Now: No Patient Has Alerts: No Electronic Signature(s) Signed: 05/07/2017 2:59:39 PM By: Roger Shelter Entered By: Roger Shelter on 05/07/2017 08:23:11 [name] ([id number]) -------------------------------------------------------------------------------- Clinic Level of Care Assessment Details Patient Name: [name] Date of Service: 05/07/2017 8:15 AM Medical Record Number: [id number] Patient Account Number: [id number] Date of Birth/Sex: 12-Aug-1980 (36 y.o. Male) Treating RN: Roger Shelter Primary Care Thierno Hun: [name] Other Clinician: Referring Olesya Wike: [name] Treating Daliah Chaudoin/Extender: Tito Dine in Treatment: 22 Clinic Level of Care Assessment Items TOOL 4 Quantity Score []  - Use when only an EandM is performed on FOLLOW-UP visit 0 ASSESSMENTS - Nursing Assessment / Reassessment []   - Reassessment of Co-morbidities (includes updates in patient status) 0 X- 1 5 Reassessment of Adherence to Treatment Plan ASSESSMENTS - Wound and Skin Assessment / Reassessment []  - Simple Wound Assessment / Reassessment - one wound 0 X- 2 5 Complex Wound Assessment / Reassessment - multiple wounds []  - 0 Dermatologic / Skin Assessment (not related to wound area) ASSESSMENTS - Focused Assessment []  - Circumferential Edema Measurements - multi extremities 0 []  - 0 Nutritional Assessment / Counseling / Intervention []  - 0 Lower Extremity Assessment (monofilament, tuning fork, pulses) []  - 0 Peripheral Arterial Disease Assessment (using hand held doppler) ASSESSMENTS - Ostomy and/or Continence Assessment and Care []  - Incontinence Assessment and Management 0 []  - 0 Ostomy Care Assessment and Management (repouching, etc.) PROCESS - Coordination of Care []  - Simple Patient / Family Education for ongoing care 0 X- 1 20 Complex (extensive) Patient / Family Education for ongoing care []  - 0 Staff obtains Programmer, systems, Records, Test Results / Process Orders []  - 0 Staff telephones HHA, Nursing Homes / Clarify orders / etc []  - 0 Routine Transfer to another Facility (non-emergent condition) []  - 0 Routine Hospital Admission (non-emergent condition) []  - 0 New Admissions / Biomedical engineer / Ordering NPWT, Apligraf, etc. []  - 0 Emergency Hospital Admission (emergent condition) []  - 0 Simple Discharge Coordination [name], [name] ([id number]) X- 1 15 Complex (extensive) Discharge Coordination PROCESS - Special Needs []  - Pediatric / Minor Patient Management 0 []  - 0 Isolation Patient Management []  - 0 Hearing / Language / Visual special needs []  - 0 Assessment of Community assistance (transportation, D/C planning, etc.) []  - 0 Additional assistance / Altered mentation []  - 0 Support Surface(s) Assessment (bed, cushion, seat, etc.) INTERVENTIONS - Wound Cleansing /  Measurement []  - Simple Wound Cleansing - one wound 0 X- 2 5 Complex Wound Cleansing - multiple wounds X- 1 5 Wound Imaging (photographs - any number of wounds) []  - 0 Wound Tracing (instead of photographs) []  - 0 Simple  Wound Measurement - one wound X- 2 5 Complex Wound Measurement - multiple wounds INTERVENTIONS - Wound Dressings []  - Small Wound Dressing one or multiple wounds 0 X- 2 15 Medium Wound Dressing one or multiple wounds []  - 0 Large Wound Dressing one or multiple wounds []  - 0 Application of Medications - topical []  - 0 Application of Medications - injection INTERVENTIONS - Miscellaneous []  - External ear exam 0 []  - 0 Specimen Collection (cultures, biopsies, blood, body fluids, etc.) []  - 0 Specimen(s) / Culture(s) sent or taken to Lab for analysis []  - 0 Patient Transfer (multiple staff / Civil Service fast streamer / Similar devices) []  - 0 Simple Staple / Suture removal (25 or less) []  - 0 Complex Staple / Suture removal (26 or more) []  - 0 Hypo / Hyperglycemic Management (close monitor of Blood Glucose) []  - 0 Ankle / Brachial Index (ABI) - do not check if billed separately X- 1 5 Vital Signs [name], [name] ([id number]) Has the patient been seen at the hospital within the last three years: Yes Total Score: 110 Level Of Care: New/Established - Level 3 Electronic Signature(s) Signed: 05/07/2017 2:59:39 PM By: Roger Shelter Entered By: Roger Shelter on 05/07/2017 09:02:31 [name] ([id number]) -------------------------------------------------------------------------------- Encounter Discharge Information Details Patient Name: [name] Date of Service: 05/07/2017 8:15 AM Medical Record Number: [id number] Patient Account Number: [id number] Date of Birth/Sex: 03/08/1981 (36 y.o. Male) Treating RN: Roger Shelter Primary Care Meagan Ancona: [name] Other Clinician: Referring Meggan Dhaliwal: [name] Treating Henri Guedes/Extender: Tito Dine in Treatment: 22 Encounter Discharge Information Items Discharge Pain Level: 0 Discharge Condition: Stable Ambulatory Status: Ambulatory Discharge Destination: Home Transportation: Private Auto Accompanied By: self Schedule Follow-up Appointment: Yes Medication Reconciliation completed and No provided to Patient/Care Sirron Francesconi: Patient Clinical Summary of Care: Declined Electronic Signature(s) Signed: 05/07/2017 2:59:39 PM By: Roger Shelter Entered By: Roger Shelter on 05/07/2017 09:03:47 [name] ([id number]) -------------------------------------------------------------------------------- Lower Extremity Assessment Details Patient Name: [name] Date of Service: 05/07/2017 8:15 AM Medical Record Number: [id number] Patient Account Number: [id number] Date of Birth/Sex: [birth year] (36 y.o. Male) Treating RN: Roger Shelter Primary Care Lissett Favorite: [name] Other Clinician: Referring Mikhala Kenan: [name] Treating Torey Regan/Extender: Ricard Dillon Weeks in Treatment: 22 Edema Assessment Assessed: [Left: No] [Right: No] Edema: [Left: No] [Right: No] Vascular Assessment Claudication: Claudication Assessment [Left:None] [Right:None] Pulses: Dorsalis Pedis Palpable: [Left:Yes] [Right:Yes] Posterior Tibial Extremity colors, hair growth, and conditions: Extremity Color: [Left:Normal] [Right:Hyperpigmented] Hair Growth on Extremity: [Left:Yes] [Right:Yes] Capillary Refill: [Left:< 3 seconds] [Right:< 3 seconds] Toe Nail Assessment Left: Right: Thick: No No Discolored: No No Deformed: No No Improper Length and Hygiene: No No Electronic Signature(s) Signed: 05/07/2017 2:59:39 PM By: Roger Shelter Entered By: Roger Shelter on 05/07/2017 08:36:15 [name], [name] ([id number]) -------------------------------------------------------------------------------- Multi Wound Chart Details Patient Name: [name] Date of Service: 05/07/2017  8:15 AM Medical Record Number: [id number] Patient Account Number: [id number] Date of Birth/Sex: 06/09/1980 (36 y.o. 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] [2:Open Surgical Wound] [N/A:N/A] Comorbid History: [1:Sleep Apnea, Hypertension, Colitis] [2:Sleep Apnea, Hypertension, Colitis] [N/A:N/A] Date Acquired: [1:11/18/2015] [2:02/02/2017] [N/A:N/A] Weeks of Treatment: [1:22] [2:13] [  N/A:N/A] Wound Status: [1:Open] [2:Open] [N/A:N/A] Measurements L x W x D [1:5.6x4.6x0.3] [2:0.3x0.3x0.1] [N/A:N/A] (cm) Area (cm) : [1:20.232] [2:0.071] [N/A:N/A] Volume (cm) : [1:6.07] [2:0.007] [N/A:N/A] % Reduction in Area: [1:-312.10%] [2:85.70%] [N/A:N/A] % Reduction in Volume: [1:-54.60%] [2:92.90%] [N/A:N/A] Classification: [1:Full Thickness Without Exposed Support Structures] [2:Partial Thickness] [N/A:N/A] Exudate Amount: [1:Medium] [2:Medium] [N/A:N/A] Exudate Type: [1:Serosanguineous] [2:Serosanguineous] [N/A:N/A] Exudate Color: [1:red, brown] [2:red, brown] [N/A:N/A] Wound Margin: [1:Distinct, outline attached] [2:Distinct, outline attached] [N/A:N/A] Granulation Amount: [1:Medium (34-66%)] [2:Small (1-33%)] [N/A:N/A] Granulation Quality: [1:Red] [2:Red] [N/A:N/A] Necrotic Amount: [1:Medium (34-66%)] [2:Large (67-100%)] [N/A:N/A] Exposed Structures: [1:Fat Layer (Subcutaneous Tissue) Exposed: Yes Fascia: No Tendon: No Muscle: No  Joint: No Bone: No] [2:Fat Layer (Subcutaneous Tissue) Exposed: Yes Fascia: No Tendon: No Muscle: No Joint: No Bone: No] [N/A:N/A] Epithelialization: [1:None] [2:Large (67-100%)] [N/A:N/A] Periwound Skin Texture: [1:Induration: Yes Excoriation: No Callus: No Crepitus: No] [2:Excoriation: No Induration: No Callus: No Crepitus: No] [N/A:N/A] Rash: No Rash: No Scarring: No Scarring: No Periwound Skin Moisture: Maceration: No Maceration: No N/A Dry/Scaly: No Dry/Scaly: No Periwound Skin Color: Erythema: Yes Atrophie Blanche: No N/A Atrophie Blanche: No Cyanosis: No Cyanosis: No Ecchymosis: No Ecchymosis: No Erythema: No Hemosiderin Staining: No Hemosiderin Staining: No Mottled: No Mottled: No Pallor: No Pallor: No Rubor: No Rubor: No Erythema Location: Circumferential N/A N/A Erythema Change: Increased N/A N/A Temperature: No Abnormality No Abnormality N/A Tenderness on Palpation: Yes Yes N/A Wound Preparation: Ulcer Cleansing: Ulcer Cleansing: N/A Rinsed/Irrigated with Saline Rinsed/Irrigated with Saline Topical Anesthetic Applied: Topical Anesthetic Applied: None, Other: Lidocaine 4% None, Other: Lidocaine 4% Treatment Notes Wound #1 (Left, Lateral Lower Leg) 1. Cleansed with: Clean wound with Normal Saline 2. Anesthetic Topical Lidocaine 4% cream to wound bed prior to debridement 4. Dressing Applied: Other dressing (specify in notes) 5. Secondary Dressing Applied Bordered Foam Dressing Notes triple antibiotic cream to wounds Wound #2 (Right, Posterior Lower Leg) 1. Cleansed with: Clean wound with Normal Saline 2. Anesthetic Topical Lidocaine 4% cream to wound bed prior to debridement 4. Dressing Applied: Other dressing (specify in notes) 5. Secondary Dressing Applied Bordered Foam Dressing Notes triple antibiotic cream to wounds Electronic Signature(s) Signed: 05/11/2017 7:28:22 AM By: Linton Ham MD Entered By: Linton Ham on 05/07/2017  09:10:55 [name] ([id number]) -------------------------------------------------------------------------------- Multi-Disciplinary Care Plan Details Patient Name: [name] Date of Service: 05/07/2017 8:15 AM Medical Record Number: [id number] Patient Account Number: [id number] Date of Birth/Sex: [birth year] (36 y.o. Male) Treating RN: Roger Shelter Primary Care Eames Dibiasio: [name] Other Clinician: Referring Zakhai Meisinger: [name] Treating Aneisha Skyles/Extender: Tito Dine in Treatment: 22 Active Inactive ` Orientation to the Wound Care Program Nursing Diagnoses: Knowledge deficit related to the wound healing center program Goals: Patient/caregiver will verbalize understanding of the [place] Program Date Initiated: 12/11/2016 Target Resolution Date: 03/13/2017 Goal Status: Active Interventions: Provide education on orientation to the wound center Notes: ` Venous Leg Ulcer Nursing Diagnoses: Knowledge deficit related to disease process and management Potential for venous Insuffiency (use before diagnosis confirmed) Goals: Non-invasive venous studies are completed as ordered Date Initiated: 12/11/2016 Target Resolution Date: 03/13/2017 Goal Status: Active Patient will maintain optimal edema control Date Initiated: 12/11/2016 Target Resolution Date: 03/13/2017 Goal Status: Active Patient/caregiver will verbalize understanding of disease process and disease management Date Initiated: 12/11/2016 Target Resolution Date: 03/13/2017 Goal Status: Active Verify adequate tissue perfusion prior to therapeutic compression application Date Initiated: 12/11/2016 Target Resolution Date: 03/13/2017 Goal Status: Active Interventions: Assess peripheral edema status every visit. Compression as ordered Provide education on venous insufficiency Treatment  Activities: [name], [name] ([id number]) Therapeutic compression applied :  12/11/2016 Notes: ` Wound/Skin Impairment Nursing Diagnoses: Impaired tissue integrity Knowledge deficit related to ulceration/compromised skin integrity Goals: Patient/caregiver will verbalize understanding of skin care regimen Date Initiated: 12/11/2016 Target Resolution Date: 03/13/2017 Goal Status: Active Ulcer/skin breakdown will have a volume reduction of 30% by week 4 Date Initiated: 12/11/2016 Target Resolution Date: 03/13/2017 Goal Status: Active Ulcer/skin breakdown will have a volume reduction of 50% by week 8 Date Initiated: 12/11/2016 Target Resolution Date: 03/13/2017 Goal Status: Active Ulcer/skin breakdown will have a volume reduction of 80% by week 12 Date Initiated: 12/11/2016 Target Resolution Date: 03/13/2017 Goal Status: Active Ulcer/skin breakdown will heal within 14 weeks Date Initiated: 12/11/2016 Target Resolution Date: 03/13/2017 Goal Status: Active Interventions: Assess patient/caregiver ability to obtain necessary supplies Assess patient/caregiver ability to perform ulcer/skin care regimen upon admission and as needed Assess ulceration(s) every visit Provide education on ulcer and skin care Treatment Activities: Skin care regimen initiated : 12/11/2016 Topical wound management initiated : 12/11/2016 Notes: Electronic Signature(s) Signed: 05/07/2017 2:59:39 PM By: Roger Shelter Entered By: Roger Shelter on 05/07/2017 08:38:41 [name], [name] ([id number]) -------------------------------------------------------------------------------- Pain Assessment Details Patient Name: [name] Date of Service: 05/07/2017 8:15 AM Medical Record Number: [id number] Patient Account Number: [id number] Date of Birth/Sex: 1981/03/17 (36 y.o. Male) Treating RN: Roger Shelter Primary Care Thaniel Coluccio: [name] Other Clinician: Referring Rether Rison: [name] Treating Aashvi Rezabek/Extender: Ricard Dillon Weeks in Treatment: 22 Active Problems Location  of Pain Severity and Description of Pain Patient Has Paino No Site Locations Pain Management and Medication Current Pain Management: Electronic Signature(s) Signed: 05/07/2017 2:59:39 PM By: Roger Shelter Entered By: Roger Shelter on 05/07/2017 08:23:20 [name] ([id number]) -------------------------------------------------------------------------------- Patient/Caregiver Education Details Patient Name: [name] Date of Service: 05/07/2017 8:15 AM Medical Record Number: [id number] Patient Account Number: [id number] Date of Birth/Gender: 1981-03-08 (36 y.o. Male) Treating RN: Roger Shelter Primary Care Physician: [name] Other Clinician: Referring Physician: [name] Treating Physician/Extender: Tito Dine in Treatment: 22 Education Assessment Education Provided To: Patient Education Topics Provided Wound/Skin Impairment: Handouts: Caring for Your Ulcer Methods: Explain/Verbal Responses: State content correctly Electronic Signature(s) Signed: 05/07/2017 2:59:39 PM By: Roger Shelter Entered By: Roger Shelter on 05/07/2017 09:03:59 [name], [name] ([id number]) -------------------------------------------------------------------------------- Wound Assessment Details Patient Name: [name] Date of Service: 05/07/2017 8:15 AM Medical Record Number: [id number] Patient Account Number: [id number] Date of Birth/Sex: [birth year] (36 y.o. Male) Treating RN: Roger Shelter Primary Care Nahlia Hellmann: [name] Other Clinician: Referring Aleysha Meckler: [name] Treating Macauley Mossberg/Extender: Ricard Dillon Weeks in Treatment: 22 Wound Status Wound Number: 1 Primary Etiology: Pyoderma Wound Location: Left Lower Leg - Lateral Wound Status: Open Wounding Event: Gradually Appeared Comorbid History: Sleep Apnea, Hypertension, Colitis Date Acquired: 11/18/2015 Weeks Of Treatment: 22 Clustered Wound: No Photos Wound  Measurements Length: (cm) 5.6 Width: (cm) 4.6 Depth: (cm) 0.3 Area: (cm) 20.232 Volume: (cm) 6.07 % Reduction in Area: -312.1% % Reduction in Volume: -54.6% Epithelialization: None Tunneling: No Undermining: No Wound Description Full Thickness Without Exposed Support Classification: Structures Wound Margin: Distinct, outline attached Exudate Medium Amount: Exudate Type: Serosanguineous Exudate Color: red, brown Foul Odor After Cleansing: No Slough/Fibrino Yes Wound Bed Granulation Amount: Medium (34-66%) Exposed Structure Granulation Quality: Red Fascia Exposed: No Necrotic Amount: Medium (34-66%) Fat Layer (Subcutaneous Tissue) Exposed: Yes Necrotic Quality: Adherent Slough Tendon Exposed: No Muscle Exposed: No Joint Exposed: No Bone Exposed: No Periwound Skin Texture [name], [name] ([id number]) Texture Color No Abnormalities Noted: No No Abnormalities Noted: No Callus: No Atrophie  Blanche: No Crepitus: No Cyanosis: No Excoriation: No Ecchymosis: No Induration: Yes Erythema: Yes Rash: No Erythema Location: Circumferential Scarring: No Erythema Change: Increased Hemosiderin Staining: No Moisture Mottled: No No Abnormalities Noted: No Pallor: No Dry / Scaly: No Rubor: No Maceration: No Temperature / Pain Temperature: No Abnormality Tenderness on Palpation: Yes Wound Preparation Ulcer Cleansing: Rinsed/Irrigated with Saline Topical Anesthetic Applied: None, Other: Lidocaine 4%, Treatment Notes Wound #1 (Left, Lateral Lower Leg) 1. Cleansed with: Clean wound with Normal Saline 2. Anesthetic Topical Lidocaine 4% cream to wound bed prior to debridement 4. Dressing Applied: Other dressing (specify in notes) 5. Secondary Dressing Applied Bordered Foam Dressing Notes triple antibiotic cream to wounds Electronic Signature(s) Signed: 05/07/2017 9:10:56 AM By: Roger Shelter Entered By: Roger Shelter on 05/07/2017 09:10:55 [name], [name]  ([id number]) -------------------------------------------------------------------------------- Wound Assessment Details Patient Name: [name] Date of Service: 05/07/2017 8:15 AM Medical Record Number: [id number] Patient Account Number: [id number] Date of Birth/Sex: 04/14/1981 (36 y.o. Male) Treating RN: Roger Shelter Primary Care Tata Timmins: [name] Other Clinician: Referring Stephaie Dardis: [name] Treating Elaya Droege/Extender: Ricard Dillon Weeks in Treatment: 22 Wound Status Wound Number: 2 Primary Etiology: Open Surgical Wound Wound Location: Right Lower Leg - Posterior Wound Status: Open Wounding Event: Surgical Injury Comorbid History: Sleep Apnea, Hypertension, Colitis Date Acquired: 02/02/2017 Weeks Of Treatment: 13 Clustered Wound: No Photos Wound Measurements Length: (cm) 0.3 % Reductio Width: (cm) 0.3 % Reductio Depth: (cm) 0.1 Epithelial Area: (cm) 0.071 Tunneling Volume: (cm) 0.007 Undermini n in Area: 85.7% n in Volume: 92.9% ization: Large (67-100%) : No ng: No Wound Description Classification: Partial Thickness Foul Odor Wound Margin: Distinct, outline attached Slough/Fi Exudate Amount: Medium Exudate Type: Serosanguineous Exudate Color: red, brown After Cleansing: No brino Yes Wound Bed Granulation Amount: Small (1-33%) Exposed Structure Granulation Quality: Red Fascia Exposed: No Necrotic Amount: Large (67-100%) Fat Layer (Subcutaneous Tissue) Exposed: Yes Necrotic Quality: Adherent Slough Tendon Exposed: No Muscle Exposed: No Joint Exposed: No Bone Exposed: No Periwound Skin Texture Texture Color [name], [name] ([id number]) No Abnormalities Noted: No No Abnormalities Noted: No Callus: No Atrophie Blanche: No Crepitus: No Cyanosis: No Excoriation: No Ecchymosis: No Induration: No Erythema: No Rash: No Hemosiderin Staining: No Scarring: No Mottled: No Pallor: No Moisture Rubor: No No Abnormalities Noted:  No Dry / Scaly: No Temperature / Pain Maceration: No Temperature: No Abnormality Tenderness on Palpation: Yes Wound Preparation Ulcer Cleansing: Rinsed/Irrigated with Saline Topical Anesthetic Applied: None, Other: Lidocaine 4%, Treatment Notes Wound #2 (Right, Posterior Lower Leg) 1. Cleansed with: Clean wound with Normal Saline 2. Anesthetic Topical Lidocaine 4% cream to wound bed prior to debridement 4. Dressing Applied: Other dressing (specify in notes) 5. Secondary Dressing Applied Bordered Foam Dressing Notes triple antibiotic cream to wounds Electronic Signature(s) Signed: 05/07/2017 9:12:09 AM By: Roger Shelter Entered By: Roger Shelter on 05/07/2017 09:12:09 [name] ([id number]) -------------------------------------------------------------------------------- [place] Details Patient Name: [name] Date of Service: 05/07/2017 8:15 AM Medical Record Number: [id number] Patient Account Number: [id number] Date of Birth/Sex: Sep 27, 1980 (36 y.o. Male) Treating RN: Roger Shelter Primary Care Ivon Roedel: [name] Other Clinician: Referring Other Atienza: [name] Treating Cherron Blitzer/Extender: Tito Dine in Treatment: 22 Vital Signs Time Taken: 08:23 Temperature (F): 97.7 Height (in): 71 Pulse (bpm): 75 Weight (lbs): 338 Respiratory Rate (breaths/min): 18 Body Mass Index (BMI): 47.1 Blood Pressure (mmHg): 151/69 Reference Range: 80 - 120 mg / dl Electronic Signature(s) Signed: 05/07/2017 2:59:39 PM By: Roger Shelter Entered By: Roger Shelter on 05/07/2017 08:25:31

## 2017-05-11 NOTE — Progress Notes (Signed)
ENGELBERT, SEVIN ([id number]) Visit Report for 05/07/2017 HPI Details Patient Name: [name], [name] Date of Service: 05/07/2017 8:15 AM Medical Record Number: [id number] Patient Account Number: [id number] Date of Birth/Sex: 07/02/1980 (36 y.o. Male) Treating RN: Renne Crigler Primary Care Provider: [name] Other Clinician: Referring Provider: [name] Treating Provider/Extender: Maxwell Caul Weeks in Treatment: 22 History of Present Illness HPI Description: 12/04/16; 36 year old man who comes into the clinic today for review of a wound on the posterior left calf. He tells me that is been there for about a year. He is not a diabetic he does smoke half a pack per day. He was seen in the ER on 11/20/16 felt to have cellulitis around the wound and was given clindamycin. An x-ray did not show osteomyelitis. The patient initially tells me that he has a milk allergy that sets off a pruritic itching rash on his lower legs which she scratches incessantly and he thinks that's what may have set up the wound. He has been using various topical antibiotics and ointments without any effect. He works in a [employer]ing [employer] and is on his feet all day. He does not have a prior history of wounds however he does have the rash on both lower legs the right arm and the ventral aspect of his left arm. These are excoriations and clearly have had scratching however there are of macular looking areas on both legs including a substantial larger area on the right leg. This does not have an underlying open area. There is no blistering. 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 He seemed to recognize this. 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 He is not in any treatment for this. He's had no  recent travel 12/11/16; the patient arrives today with his wound and roughly the same condition we've been using silver alginate this is a deep punched out wound with some surrounding erythema but no tenderness. Biopsy I did did not show confirmed pyoderma gangrenosum suggested nonspecific inflammation and vasculitis but does not provide an actual description of what was seen by the pathologist. I'm really not able to understand this We have also received information from the patient's dermatologist in [place] notes from April 2016. This was a doctor Agarwal- antal. 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 He was prescribed topical steroid high potency under occlusion which helped but at this point the patient did not have a deep punched out wound. 12/18/16; the patient's wound is larger in terms of surface area however this surface looks better and there is less depth. The surrounding erythema also is better. The patient states that the wrap we put on came off 2 days ago when he has been using his compression stockings. He we are in the process of getting a dermatology consult. 12/26/16 on evaluation today patient's left lower extremity wound shows evidence of infection with surrounding erythema noted. He has been tolerating the dressing changes but states that he has noted more discomfort. There is a larger area of erythema surrounding the wound. No fevers, chills, nausea, or vomiting noted at this time. With that being said the wound still does have slough covering the surface. He is not allergic to any medication that he is aware of at this  point. In regard to his right lower extremity he had several regions that are erythematous and pruritic he wonders if there's anything we can do to help that. 01/02/17 I reviewed patient's wound culture which was obtained his visit last week. He was placed on doxycycline at that point. Unfortunately that does not appear to be an antibiotic that  would likely help with the situation however the pseudomonas noted on culture is sensitive to Cipro. Also unfortunately patient's wound seems to have a large compared to last week's evaluation. Not severely so but there are definitely increased measurements in general. He is continuing to have discomfort as well he writes this to be a seven out of 10. In fact he would prefer me not to perform any debridement today due to the fact that he is having discomfort and considering he has an active infection on the little reluctant to do so anyway. No fevers, chills, nausea, or vomiting noted at this time. 01/08/17; patient seems dermatology on September 5. I suspect dermatology will want the slides from the biopsy I did sent to their pathologist. I'm not sure if there is a way we can expedite that. In any case the culture I did before I left on vacation 3 Goldenstein, Marion ([id number]) weeks ago showed Pseudomonas he was given 10 days of Cipro and per her description of her intake nurses is actually somewhat better this week although the wound is quite a bit bigger than I remember the last time I saw this. He still has 3 more days of Cipro 01/21/17; dermatology appointment tomorrow. He has completed the ciprofloxacin for Pseudomonas. Surface of the wound looks better however he is had some deterioration in the lesions on his right leg. Meantime the left lateral leg wound we will continue with sample 01/29/17; patient had his dermatology appointment but I can't yet see that note. He is completed his antibiotics. The wound is more superficial but considerably larger in circumferential area than when he came in. This is in his left lateral calf. He also has swollen erythematous areas with superficial wounds on the right leg and small papular areas on both arms. There apparently areas in her his upper thighs and buttocks I did not look at those. Dermatology biopsied the right leg. Hopefully will have their input next  week. 02/05/17; patient went back to see his dermatologist who told him that he had a "scratching problem" as well as staph. He is now on a 30 day course of doxycycline and I believe she gave him triamcinolone cream to the right leg areas to help with the itching [not exactly sure but probably triamcinolone]. She apparently looked at the left lateral leg wound although this was not rebiopsied and I think felt to be ultimately part of the same pathogenesis. He is using sample border foam and changing nevus himself. He now has a new open area on the right posterior leg which was his biopsy site I don't have any of the dermatology notes 02/12/17; we put the patient in compression last week with SANTYL to the wound on the left leg and the biopsy. Edema is much better and the depth of the wound is now at level of skin. Area is still the same oBiopsy site on the right lateral leg we've also been using santyl with a border foam dressing and he is changing this himself. 02/19/17; Using silver alginate started last week to both the substantial left leg wound and the biopsy site on the right wound.  He is tolerating compression well. Has a an appointment with his primary M.D. tomorrow wondering about diuretics although I'm wondering if the edema problem is actually lymphedema 02/26/17; the patient has been to see his primary doctor Dr. Jerelyn Charles at [facility] our primary care. She started him on Lasix 20 mg and this seems to have helped with the edema. However we are not making substantial change with the left lateral calf wound and inflammation. The biopsy site on the right leg also looks stable but not really all that different. 03/12/17; the patient has been to see vein and vascular Dr. Wyn Quaker. He has had venous reflux studies I have not reviewed these. I did get a call from his dermatology office. They felt that he might have pathergy based on their biopsy on his right leg which led them to look at the slides  of the biopsy I did on the left leg and they wonder whether this represents pyoderma gangrenosum which was the original supposition in a man with ulcerative colitis albeit inactive for many years. They therefore recommended clobetasol and tetracycline i.e. 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 03/26/17; apparently the patient just had reflux studies not an appointment with Dr. dew. She arrives in clinic today having applied clobetasol for 2-3 weeks. He notes over the last 2-3 days excessive drainage having to change the dressing 3-4 times a day and also expanding erythema. 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 Carles Collet at [facility] dermatology locally who kindly saw him at my request. 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 My concern was the degree of erythema medially extending from his left leg wound which was either inflammation from pyoderma or cellulitis. I put him on Augmentin however culture of the wound showed Pseudomonas which is quinolone sensitive. I really don't believe he has cellulitis however in view of everything I will continue and give him a course of Cipro. He is also on doxycycline as an immune modulator for the pyoderma. In addition to his original wound on the left lateral leg with surrounding erythema he has a wound on the right posterior calf which was an original biopsy site done by dermatology. 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 Saw Dr. Carles Collet yesterday. He has been using topical antibiotics to both wound areas his original wound on the left and the biopsies/pathergy area on  the right. There is definitely some improvement in the inflammation around the wound on the right although the patient states he has increasing sensitivity of the wounds. He is on prednisone 60 and doxycycline 1 as prescribed by Dr. Carles Collet. He is covering the topical antibiotic with gauze and putting this in his own compression stocks and changing this daily. He states that Dr. Aquilla Hacker did a culture of the left leg wound yesterday 05/07/17; pyoderma gangrenosum. The patient saw Dr. Carles Collet yesterday and has a follow-up with her in one month. He is still using topical antibiotics to both wounds although he can't recall exactly what type. He is still on prednisone 60 mg. Dr. Carles Collet stated that the doxycycline could stop if we were in agreement. He has been using his own compression stocks changing daily  Electronic Signature(s) Signed: 05/11/2017 7:28:22 AM By: Baltazar Najjar MD [place], [name] ([id number]) Entered By: Baltazar Najjar on 05/07/2017 09:12:12 MAKAIO, MACH ([id number]) -------------------------------------------------------------------------------- Physical Exam Details Patient Name: [name] Date of Service: 05/07/2017 8:15 AM Medical Record Number: [id number] Patient Account Number: [id number] Date of Birth/Sex: 20-Apr-1981 (36 y.o. Male) Treating RN: Renne Crigler Primary Care Provider: [name] Other Clinician: Referring Provider: [name] Treating Provider/Extender: Altamese [place] in Treatment: 22 Constitutional Patient is hypertensive.. Pulse regular and within target range for patient.Marland Kitchen Respirations regular, non-labored and within target range.. Temperature is normal and within the target range for the patient.Marland Kitchen appears in no distress. Eyes Conjunctivae clear. No discharge. Respiratory Respiratory effort is easy and symmetric bilaterally. Rate is normal at rest and on room air.. Cardiovascular Pedal pulses palpable and strong  bilaterally.. Integumentary (Hair, Skin) The degree of erythema around the wounds both on the right medial and left lateral calf is considerably better. Much less inflammation no tenderness. Psychiatric No evidence of depression, anxiety, or agitation. Calm, cooperative, and communicative. Appropriate interactions and affect.. Notes Wound exam; the left lateral wound still has a large amount of surface slough. No debridement done again out of concerns of the pathergy associated with his underlying diagnosis. The inflammation around the wound is a lot better oOn the right medial leg only a small open area remains although it still has some surface slough is well Electronic Signature(s) Signed: 05/11/2017 7:28:22 AM By: Baltazar Najjar MD Entered By: Baltazar Najjar on 05/07/2017 09:14:15 [name] ([id number]) -------------------------------------------------------------------------------- Physician Orders Details Patient Name: [name] Date of Service: 05/07/2017 8:15 AM Medical Record Number: [id number] Patient Account Number: [id number] Date of Birth/Sex: [birth year] (36 y.o. Male) Treating RN: Renne Crigler Primary Care Provider: [name] Other Clinician: Referring Provider: [name] Treating Provider/Extender: Altamese [place] in Treatment: 17 Verbal / Phone Orders: No Diagnosis Coding Wound Cleansing Wound #1 Left,Lateral Lower Leg o Clean wound with Normal Saline. Wound #2 Right,Posterior Lower Leg o Clean wound with Normal Saline. Anesthetic (add to Medication List) Wound #1 Left,Lateral Lower Leg o Topical Lidocaine 4% cream applied to wound bed prior to debridement (In Clinic Only). Wound #2 Right,Posterior Lower Leg o Topical Lidocaine 4% cream applied to wound bed prior to debridement (In Clinic Only). Primary Wound Dressing Wound #1 Left,Lateral Lower Leg o Other: - triple antibiotic cream to open wound area Wound #2  Right,Posterior Lower Leg o Other: - triple antibiotic cream Secondary Dressing Wound #1 Left,Lateral Lower Leg o Boardered Foam Dressing Wound #2 Right,Posterior Lower Leg o Boardered Foam Dressing Dressing Change Frequency Wound #1 Left,Lateral Lower Leg o Change dressing every day. Wound #2 Right,Posterior Lower Leg o Change dressing every day. Follow-up Appointments o Other: - appointment in 5 weeks per Dr. Leanord Hawking Edema Control Wound #1 Left,Lateral Lower Leg o Patient to wear own compression stockings Wound #2 Right,Posterior Lower Leg Wisnieski, Elbie ([id number]) o Patient to wear own compression stockings Medications-please add to medication list. Wound #1 Left,Lateral Lower Leg o P.O. Antibiotics - Continue Doxy, Wound #2 Right,Posterior Lower Leg o P.O. Antibiotics - Continue Doxy, Electronic Signature(s) Signed: 05/07/2017 2:59:39 PM By: Renne Crigler Signed: 05/11/2017 7:28:22 AM By: Baltazar Najjar MD Entered By: Renne Crigler on 05/07/2017 09:01:45 VAL, SCHIAVO ([id number]) -------------------------------------------------------------------------------- Problem List Details Patient Name: [name] Date of Service: 05/07/2017 8:15 AM Medical Record Number: [id number] Patient Account Number: [id number] Date of Birth/Sex: January 03, 1981 (36 y.o. 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 12/04/2016 Yes L88 Pyoderma gangrenosum 03/26/2017 Yes I87.2 Venous insufficiency (chronic) (peripheral) 12/04/2016 Yes L97.213 Non-pressure chronic ulcer of right calf with necrosis of muscle 04/02/2017 Yes Inactive Problems Resolved Problems Electronic  Signature(s) Signed: 05/11/2017 7:28:22 AM By: Baltazar Najjarobson, Jenine Krisher MD Entered By: Baltazar Najjarobson, Dayanna Pryce on 05/07/2017 09:10:42 Laray AngerLAMER, Abel ([id number]) -------------------------------------------------------------------------------- Progress Note Details Patient Name: Laray AngerLAMER, Deforrest Date of Service: 05/07/2017 8:15 AM Medical Record Number: [id number] Patient Account Number: [id number] Date of Birth/Sex: 02/28/1981 [age] y.o. Male) Treating RN: Renne CriglerFlinchum, Cheryl Primary Care Provider: Vernona RiegerLARK, KATHERINE Other Clinician: Referring Provider: Vernona RiegerLARK, KATHERINE Treating Provider/Extender: Maxwell CaulOBSON, Hanan Moen G Weeks in Treatment: 22 Subjective History of Present Illness (HPI) 12/04/16; 36 year old man who comes into the clinic today for review of a wound on the posterior left calf. He tells me that is been there for about a year. He is not a diabetic he does smoke half a pack per day. He was seen in the ER on 11/20/16 felt to have cellulitis around the wound and was given clindamycin. An x-ray did not show osteomyelitis. The patient initially tells me that he has a milk allergy that sets off a pruritic itching rash on his lower legs which she scratches incessantly and he thinks that's what may have set up the wound. He has been using various topical antibiotics and ointments without any effect. He works in a [employer]ing [employer] and is on his feet all day. He does not have a prior history of wounds however he does have the rash on both lower legs the right arm and the ventral aspect of his left arm. These are excoriations and clearly have had scratching however there are of macular looking areas on both legs including a substantial larger area on the right leg. This does not have an underlying open area. There is no blistering. The patient tells me that 2 years ago in [place] in response to the rash on his legs he saw a dermatologist who told him he had a condition which may be pyoderma gangrenosum although I may be putting  words into his mouth. He seemed to recognize this. 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 He is not in any treatment for this. He's had no recent travel 12/11/16; the patient arrives today with his wound and roughly the same condition we've been using silver alginate this is a deep punched out wound with some surrounding erythema but no tenderness. Biopsy I did did not show confirmed pyoderma gangrenosum suggested nonspecific inflammation and vasculitis but does not provide an actual description of what was seen by the pathologist. I'm really not able to understand this We have also received information from the patient's dermat[place] in [place]. This was a doctor Agarwal- antal. 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 He was prescribed topical steroid high potency under occlusion which helped but at this point the patient did not have a deep punched out wound. 12/18/16; the patient's wound is larger in terms of surface area however this surface looks better and there is less depth. The surrounding erythema also is better. The patient states that the wrap we put on came off 2 days ago when he has been using his compression stockings. He we are in the process of getting a dermatology consult.  12/26/16 on evaluation today patient's left lower extremity wound shows evidence of infection with surrounding erythema noted. He has been tolerating the dressing changes but states that he has noted more discomfort. There is a larger area of erythema surrounding the wound. No fevers, chills, nausea, or vomiting noted at this time. With that being said the wound still does have slough covering the surface. He is not allergic to any medication that he is aware of at this point. In regard to his right lower extremity he had several regions that are erythematous and pruritic he wonders if there's anything we can do to  help that. 01/02/17 I reviewed patient's wound culture which was obtained his visit last week. He was placed on doxycycline at that point. Unfortunately that does not appear to be an antibiotic that would likely help with the situation however the pseudomonas noted on culture is sensitive to Cipro. Also unfortunately patient's wound seems to have a large compared to last week's evaluation. Not severely so but there are definitely increased measurements in general. He is continuing to have discomfort as well he writes this to be a seven out of 10. In fact he would prefer me not to perform any debridement today due to the fact that he is having discomfort and considering he has an active infection on the little reluctant to do so anyway. No fevers, chills, nausea, or vomiting noted at this time. 01/08/17; patient seems dermatology on September 5. I suspect dermatology will want the slides from the biopsy I did sent to their pathologist. I'm not sure if there is a way we can expedite that. In any case the culture I did before I left on vacation 3 weeks ago showed Pseudomonas he was given 10 days of Cipro and per her description of her intake nurses is actually somewhat better this week although the wound is quite a bit bigger than I remember the last time I saw this. He still has 3 Gill, Dameon ([id number]) more days of Cipro 01/21/17; dermatology appointment tomorrow. He has completed the ciprofloxacin for Pseudomonas. Surface of the wound looks better however he is had some deterioration in the lesions on his right leg. Meantime the left lateral leg wound we will continue with sample 01/29/17; patient had his dermatology appointment but I can't yet see that note. He is completed his antibiotics. The wound is more superficial but considerably larger in circumferential area than when he came in. This is in his left lateral calf. He also has swollen erythematous areas with superficial wounds on the right  leg and small papular areas on both arms. There apparently areas in her his upper thighs and buttocks I did not look at those. Dermatology biopsied the right leg. Hopefully will have their input next week. 02/05/17; patient went back to see his dermatologist who told him that he had a "scratching problem" as well as staph. He is now on a 30 day course of doxycycline and I believe she gave him triamcinolone cream to the right leg areas to help with the itching [not exactly sure but probably triamcinolone]. She apparently looked at the left lateral leg wound although this was not rebiopsied and I think felt to be ultimately part of the same pathogenesis. He is using sample border foam and changing nevus himself. He now has a new open area on the right posterior leg which was his biopsy site I don't have any of the dermatology notes 02/12/17; we put the patient in compression last  week with SANTYL to the wound on the left leg and the biopsy. Edema is much better and the depth of the wound is now at level of skin. Area is still the same Biopsy site on the right lateral leg we've also been using santyl with a border foam dressing and he is changing this himself. 02/19/17; Using silver alginate started last week to both the substantial left leg wound and the biopsy site on the right wound. He is tolerating compression well. Has a an appointment with his primary M.D. tomorrow wondering about diuretics although I'm wondering if the edema problem is actually lymphedema 02/26/17; the patient has been to see his primary doctor Dr. Jerelyn Charles at [facility] our primary care. She started him on Lasix 20 mg and this seems to have helped with the edema. However we are not making substantial change with the left lateral calf wound and inflammation. The biopsy site on the right leg also looks stable but not really all that different. 03/12/17; the patient has been to see vein and vascular Dr. Wyn Quaker. He has had venous  reflux studies I have not reviewed these. I did get a call from his dermatology office. 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 They therefore recommended clobetasol and tetracycline i.e. 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 03/26/17; apparently the patient just had reflux studies not an appointment with Dr. dew. She arrives in clinic today having applied clobetasol for 2-3 weeks. He notes over the last 2-3 days excessive drainage having to change the dressing 3-4 times a day and also expanding erythema. 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 Carles Collet at [facility] dermatology locally who kindly saw him at my request. 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 My concern was the degree of erythema medially extending from his left leg wound which was either inflammation from pyoderma or cellulitis. I put him on Augmentin however culture of the wound showed Pseudomonas which is quinolone sensitive. I really don't believe he has cellulitis however in view of everything I will continue and give him a course of Cipro. He is also on doxycycline as an immune modulator for the pyoderma. In addition to his original wound on the left lateral leg with surrounding erythema he has a wound on the right posterior calf which was an original biopsy site done by dermatology. This was felt to represent pathergy from  pyoderma gangrenosum 04/16/17; pyoderma gangrenosum. Saw Dr. Carles Collet yesterday. He has been using topical antibiotics to both wound areas his original wound on the left and the biopsies/pathergy area on the right. There is definitely some improvement in the inflammation around the wound on the right although the patient states he has increasing sensitivity of the wounds. He is on prednisone 60 and doxycycline 1 as prescribed by Dr. Carles Collet. He is covering the topical antibiotic with gauze and putting this in his own compression stocks and changing this  daily. He states that Dr. Aquilla HackerPerlstein did a culture of the left leg wound yesterday 05/07/17; pyoderma gangrenosum. The patient saw Dr. Carles ColletPearlstein yesterday and has a follow-up with her in one month. He is still using topical antibiotics to both wounds although he can't recall exactly what type. He is still on prednisone 60 mg. Dr. Carles ColletPearlstein stated that the doxycycline could stop if we were in agreement. He has been using his own compression stocks changing daily Laray AngerLAMER, [name] ([id number]) Objective Constitutional Patient is hypertensive.. Pulse regular and within target range for patient.Marland Kitchen. Respirations regular, non-labored and within target range.. Temperature is normal and within the target range for the patient.Marland Kitchen. appears in no distress. Vitals Time Taken: 8:23 AM, Height: 71 in, Weight: 338 lbs, BMI: 47.1, Temperature: 97.7 F, Pulse: 75 bpm, Respiratory Rate: 18 breaths/min, Blood Pressure: 151/69 mmHg. Eyes Conjunctivae clear. No discharge. Respiratory Respiratory effort is easy and symmetric bilaterally. Rate is normal at rest and on room air.. Cardiovascular Pedal pulses palpable and strong bilaterally.Marland Kitchen. Psychiatric No evidence of depression, anxiety, or agitation. Calm, cooperative, and communicative. Appropriate interactions and affect.. General Notes: Wound exam; the left lateral wound still has a large amount of surface  slough. No debridement done again out of concerns of the pathergy associated with his underlying diagnosis. The inflammation around the wound is a lot better On the right medial leg only a small open area remains although it still has some surface slough is well Integumentary (Hair, Skin) The degree of erythema around the wounds both on the right medial and left lateral calf is considerably better. Much less inflammation no tenderness. Wound #1 status is Open. Original cause of wound was Gradually Appeared. The wound is located on the Left,Lateral Lower Leg. The wound measures 5.6cm length x 4.6cm width x 0.3cm depth; 20.232cm^2 area and 6.07cm^3 volume. There is Fat Layer (Subcutaneous Tissue) Exposed exposed. There is no tunneling or undermining noted. There is a medium amount of serosanguineous drainage noted. The wound margin is distinct with the outline attached to the wound base. There is medium (34-66%) red granulation within the wound bed. There is a medium (34-66%) amount of necrotic tissue within the wound bed including Adherent Slough. The periwound skin appearance exhibited: Induration, Erythema. The periwound skin appearance did not exhibit: Callus, Crepitus, Excoriation, Rash, Scarring, Dry/Scaly, Maceration, Atrophie Blanche, Cyanosis, Ecchymosis, Hemosiderin Staining, Mottled, Pallor, Rubor. The surrounding wound skin color is noted with erythema which is circumferential. Periwound temperature was noted as No Abnormality. The periwound has tenderness on palpation. Wound #2 status is Open. Original cause of wound was Surgical Injury. The wound is located on the Right,Posterior Lower Leg. The wound measures 0.3cm length x 0.3cm width x 0.1cm depth; 0.071cm^2 area and 0.007cm^3 volume. There is Fat Layer (Subcutaneous Tissue) Exposed exposed. There is no tunneling or undermining noted. There is a medium amount of serosanguineous drainage noted. The wound margin is distinct with the  outline attached to the wound base. There is small (1-33%) red granulation within the wound bed. There is a large (67-100%) amount of necrotic tissue within the wound bed including Adherent Slough. The periwound skin appearance did not exhibit: Callus, Crepitus, Excoriation, Induration, Rash, Scarring, Dry/Scaly, Maceration, Atrophie Blanche, Cyanosis, Ecchymosis, Hemosiderin Staining, Mottled, Pallor, Rubor, Erythema. Periwound temperature was noted as No Abnormality. The periwound has tenderness on palpation. Assessment Nicolosi, Kejon ([id number]) Active Problems ICD-10 [phone] - 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sis of muscle L88 - Pyoderma gangrenosum I87.2 - Venous insufficiency (chronic) (peripheral)  Z61.096 - Non-pressure chronic ulcer of right calf with necrosis of muscle Plan Wound Cleansing: Wound #1 Left,Lateral Lower Leg: Clean wound with Normal Saline. Wound #2 Right,Posterior Lower Leg: Clean wound with Normal Saline. Anesthetic (add to Medication List): Wound #1 Left,Lateral Lower Leg: Topical Lidocaine 4% cream applied to wound bed prior to debridement (In Clinic Only). Wound #2 Right,Posterior Lower Leg: Topical Lidocaine 4% cream applied to wound bed prior to debridement (In Clinic Only). Primary Wound Dressing: Wound #1 Left,Lateral Lower Leg: Other: - triple antibiotic cream to open wound area Wound #2 Right,Posterior Lower Leg: Other: - triple antibiotic cream Secondary Dressing: Wound #1 Left,Lateral Lower Leg: Boardered Foam Dressing Wound #2 Right,Posterior Lower Leg: Boardered Foam Dressing Dressing Change Frequency: Wound #1 Left,Lateral Lower Leg: Change dressing every day. Wound #2 Right,Posterior Lower Leg: Change dressing every day. Follow-up Appointments: Other: - appointment in 5 weeks per Dr. Leanord Hawking Edema Control: Wound #1 Left,Lateral Lower Leg: Patient to wear own compression stockings Wound #2 Right,Posterior Lower  Leg: Patient to wear own compression stockings Medications-please add to medication list.: Wound #1 Left,Lateral Lower Leg: P.O. Antibiotics - Continue Doxy, Wound #2 Right,Posterior Lower Leg: P.O. Antibiotics - Continue Doxy, Mcfate, Presten ([id number]) o #1 I have really not altered anything the patient is currently doing to his wound although I think ultimately we may need to do something about this slough on the left lateral wound especially if this does not progress in terms of dimensions by the time I see him next time #2 clearly be inflammation around both wounds is considerably better than the one on the right is close to healing #3 I think the best thing to do here would be just to continue with the topical antibiotics that he is using. He sees Dr. Carles Collet again in 4 weeks we'll see him in 5 weeks. If at that time the area on the left is not any better in terms of size then we may need to look at it debriding agent such as Santyl perhaps meta-honey or Iodoflex #4 the patient is not having any overt side effects of reasonably high dose prednisone #5 the patient can d/c the doxy from my point of view Electronic Signature(s) Signed: 05/11/2017 7:28:22 AM By: Baltazar Najjar MD Entered By: Baltazar Najjar on 05/07/2017 09:18:56 [name] ([id number]) -------------------------------------------------------------------------------- SuperBill Details Patient Name: [name] Date of Service: 05/07/2017 Medical Record Number: [id number] Patient Account Number: [id number] Date of Birth/Sex: 06-05-1980 (36 y.o. Male) Treating RN: Renne Crigler Primary Care Provider: [name] Other Clinician: Referring Provider: [name] Treating Provider/Extender: Maxwell Caul Weeks in Treatment: 22 Diagnosis Coding ICD-10 Codes Code Description [phone] 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sis of muscle L88 Pyoderma gangrenosum I87.2 Venous insufficiency  (chronic) (peripheral) L97.213 Non-pressure chronic ulcer of right calf with necrosis of muscle Facility Procedures CPT4 Code: [id number] Description: 99213 - WOUND CARE VISIT-LEV 3 EST PT Modifier: Quantity: 1 Physician Procedures CPT4 Code: [id number] Description: 99213 - WC PHYS LEVEL 3 - EST PT ICD-10 Diagnosis Description L97.223 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sis of m L88 Pyoderma gangrenosum Modifier: uscle Quantity: 1 Electronic Signature(s) Signed: 05/11/2017 7:28:22 AM By: Baltazar Najjar MD Entered By: Baltazar Najjar on 05/07/2017 09:16:53

## 2017-05-14 DIAGNOSIS — L97222 Non-pressure chronic ulcer of left calf with fat layer exposed: Secondary | ICD-10-CM | POA: Diagnosis not present

## 2017-05-14 DIAGNOSIS — G4730 Sleep apnea, unspecified: Secondary | ICD-10-CM | POA: Diagnosis not present

## 2017-06-03 ENCOUNTER — Ambulatory Visit
Admit: 2017-06-03 | Discharge: 2017-06-04 | Payer: PRIVATE HEALTH INSURANCE | Attending: Dermatology | Primary: Dermatology

## 2017-06-03 DIAGNOSIS — L88 Pyoderma gangrenosum: Principal | ICD-10-CM

## 2017-06-03 MED ORDER — PREDNISONE 20 MG TABLET
ORAL_TABLET | Freq: Every day | ORAL | 1 refills | 0 days | Status: CP
Start: 2017-06-03 — End: 2017-08-04

## 2017-06-11 ENCOUNTER — Encounter: Payer: 59 | Attending: Internal Medicine | Admitting: Internal Medicine

## 2017-06-11 DIAGNOSIS — L88 Pyoderma gangrenosum: Secondary | ICD-10-CM | POA: Insufficient documentation

## 2017-06-11 DIAGNOSIS — L97223 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sis of muscle: Secondary | ICD-10-CM | POA: Diagnosis not present

## 2017-06-11 DIAGNOSIS — S81802A Unspecified open wound, left lower leg, initial encounter: Secondary | ICD-10-CM | POA: Diagnosis not present

## 2017-06-11 DIAGNOSIS — T8189XA Other complications of procedures, not elsewhere classified, initial encounter: Secondary | ICD-10-CM | POA: Diagnosis not present

## 2017-06-11 DIAGNOSIS — I872 Venous insufficiency (chronic) (peripheral): Secondary | ICD-10-CM | POA: Insufficient documentation

## 2017-06-11 LAB — GLUCOSE, CAPILLARY: Glucose-Capillary: 103 mg/dL — ABNORMAL HIGH (ref 65–99)

## 2017-06-12 NOTE — Progress Notes (Addendum)
[name] ([id number]) Visit Report for 06/11/2017 HPI Details Patient Name: [name], [name] Date of Service: 06/11/2017 8:15 AM Medical Record Number: [id number] Patient Account Number: [id number] Date of Birth/Sex: 07-18-1980 (37 y.o. Male) Treating RN: Renne Crigler Primary Care Provider: [name] Other Clinician: Referring Provider: [name] Treating Provider/Extender: Maxwell Caul Weeks in Treatment: 86 History of Present Illness HPI Description: 12/04/16; 37 year old man who comes into the clinic today for review of a wound on the posterior left calf. He tells me that is been there for about a year. He is not a diabetic he does smoke half a pack per day. He was seen in the ER on 11/20/16 felt to have cellulitis around the wound and was given clindamycin. An x-ray did not show osteomyelitis. The patient initially tells me that he has a milk allergy that sets off a pruritic itching rash on his lower legs which she scratches incessantly and he thinks that's what may have set up the wound. He has been using various topical antibiotics and ointments without any effect. He works in a [employer]ing [employer] and is on his feet all day. He does not have a prior history of wounds however he does have the rash on both lower legs the right arm and the ventral aspect of his left arm. These are excoriations and clearly have had scratching however there are of macular looking areas on both legs including a substantial larger area on the right leg. This does not have an underlying open area. There is no blistering. 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 He seemed to recognize this. 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 He is not in any treatment for this. He's had no  recent travel 12/11/16; the patient arrives today with his wound and roughly the same condition we've been using silver alginate this is a deep punched out wound with some surrounding erythema but no tenderness. Biopsy I did did not show confirmed pyoderma gangrenosum suggested nonspecific inflammation and vasculitis but does not provide an actual description of what was seen by the pathologist. I'm really not able to understand this We have also received information from the patient's dermatologist in [place] notes from April 2016. This was a doctor Agarwal- antal. 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 He was prescribed topical steroid high potency under occlusion which helped but at this point the patient did not have a deep punched out wound. 12/18/16; the patient's wound is larger in terms of surface area however this surface looks better and there is less depth. The surrounding erythema also is better. The patient states that the wrap we put on came off 2 days ago when he has been using his compression stockings. He we are in the process of getting a dermatology consult. 12/26/16 on evaluation today patient's left lower extremity wound shows evidence of infection with surrounding erythema noted. He has been tolerating the dressing changes but states that he has noted more discomfort. There is a larger area of erythema surrounding the wound. No fevers, chills, nausea, or vomiting noted at this time. With that being said the wound still does have slough covering the surface. He is not allergic to any medication that he is aware of at this  point. In regard to his right lower extremity he had several regions that are erythematous and pruritic he wonders if there's anything we can do to help that. 01/02/17 I reviewed patient's wound culture which was obtained his visit last week. He was placed on doxycycline at that point. Unfortunately that does not appear to be an antibiotic that  would likely help with the situation however the pseudomonas noted on culture is sensitive to Cipro. Also unfortunately patient's wound seems to have a large compared to last week's evaluation. Not severely so but there are definitely increased measurements in general. He is continuing to have discomfort as well he writes this to be a seven out of 10. In fact he would prefer me not to perform any debridement today due to the fact that he is having discomfort and considering he has an active infection on the little reluctant to do so anyway. No fevers, chills, nausea, or vomiting noted at this time. 01/08/17; patient seems dermatology on September 5. I suspect dermatology will want the slides from the biopsy I did sent to their pathologist. I'm not sure if there is a way we can expedite that. In any case the culture I did before I left on vacation 3 [name] ([id number]) weeks ago showed Pseudomonas he was given 10 days of Cipro and per her description of her intake nurses is actually somewhat better this week although the wound is quite a bit bigger than I remember the last time I saw this. He still has 3 more days of Cipro 01/21/17; dermatology appointment tomorrow. He has completed the ciprofloxacin for Pseudomonas. Surface of the wound looks better however he is had some deterioration in the lesions on his right leg. Meantime the left lateral leg wound we will continue with sample 01/29/17; patient had his dermatology appointment but I can't yet see that note. He is completed his antibiotics. The wound is more superficial but considerably larger in circumferential area than when he came in. This is in his left lateral calf. He also has swollen erythematous areas with superficial wounds on the right leg and small papular areas on both arms. There apparently areas in her his upper thighs and buttocks I did not look at those. Dermatology biopsied the right leg. Hopefully will have their input next  week. 02/05/17; patient went back to see his dermatologist who told him that he had a "scratching problem" as well as staph. He is now on a 30 day course of doxycycline and I believe she gave him triamcinolone cream to the right leg areas to help with the itching [not exactly sure but probably triamcinolone]. She apparently looked at the left lateral leg wound although this was not rebiopsied and I think felt to be ultimately part of the same pathogenesis. He is using sample border foam and changing nevus himself. He now has a new open area on the right posterior leg which was his biopsy site I don't have any of the dermatology notes 02/12/17; we put the patient in compression last week with SANTYL to the wound on the left leg and the biopsy. Edema is much better and the depth of the wound is now at level of skin. Area is still the same oBiopsy site on the right lateral leg we've also been using santyl with a border foam dressing and he is changing this himself. 02/19/17; Using silver alginate started last week to both the substantial left leg wound and the biopsy site on the right wound.  He is tolerating compression well. Has a an appointment with his primary M.D. tomorrow wondering about diuretics although I'm wondering if the edema problem is actually lymphedema 02/26/17; the patient has been to see his primary doctor Dr. Jerelyn Charles at [facility] our primary care. She started him on Lasix 20 mg and this seems to have helped with the edema. However we are not making substantial change with the left lateral calf wound and inflammation. The biopsy site on the right leg also looks stable but not really all that different. 03/12/17; the patient has been to see vein and vascular Dr. Wyn Quaker. He has had venous reflux studies I have not reviewed these. I did get a call from his dermatology office. They felt that he might have pathergy based on their biopsy on his right leg which led them to look at the slides  of the biopsy I did on the left leg and they wonder whether this represents pyoderma gangrenosum which was the original supposition in a man with ulcerative colitis albeit inactive for many years. They therefore recommended clobetasol and tetracycline i.e. 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 03/26/17; apparently the patient just had reflux studies not an appointment with Dr. dew. She arrives in clinic today having applied clobetasol for 2-3 weeks. He notes over the last 2-3 days excessive drainage having to change the dressing 3-4 times a day and also expanding erythema. 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 Carles Collet at [facility] dermatology locally who kindly saw him at my request. 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 My concern was the degree of erythema medially extending from his left leg wound which was either inflammation from pyoderma or cellulitis. I put him on Augmentin however culture of the wound showed Pseudomonas which is quinolone sensitive. I really don't believe he has cellulitis however in view of everything I will continue and give him a course of Cipro. He is also on doxycycline as an immune modulator for the pyoderma. In addition to his original wound on the left lateral leg with surrounding erythema he has a wound on the right posterior calf which was an original biopsy site done by dermatology. 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 Saw Dr. Carles Collet yesterday. He has been using topical antibiotics to both wound areas his original wound on the left and the biopsies/pathergy area on  the right. There is definitely some improvement in the inflammation around the wound on the right although the patient states he has increasing sensitivity of the wounds. He is on prednisone 60 and doxycycline 1 as prescribed by Dr. Carles Collet. He is covering the topical antibiotic with gauze and putting this in his own compression stocks and changing this daily. He states that Dr. Aquilla Hacker did a culture of the left leg wound yesterday 05/07/17; pyoderma gangrenosum. The patient saw Dr. Carles Collet yesterday and has a follow-up with her in one month. He is still using topical antibiotics to both wounds although he can't recall exactly what type. He is still on prednisone 60 mg. Dr. Carles Collet stated that the doxycycline could stop if we were in agreement. 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 The right medial leg was induced by biopsy/pathergy. The area on the right is essentially healed. Still on high-dose prednisone using topical antibiotics to the wound [name], [name] ([id number]) Electronic Signature(s) Signed: 06/11/2017 5:00:32 PM By: Baltazar Najjar MD Entered By: Baltazar Najjar on 06/11/2017 08:51:18 [name], [name] ([id number]) -------------------------------------------------------------------------------- Physical Exam Details Patient Name: [name] Date of Service: 06/11/2017 8:15 AM Medical Record Number: [id number] Patient Account Number: [id number] Date of Birth/Sex: 1981-03-24 (38 y.o. Male) Treating RN: Renne Crigler Primary Care Provider: [name] Other Clinician: Referring Provider: [name] Treating Provider/Extender: Altamese [place] in Treatment: 62 Constitutional Patient is hypertensive.. Pulse regular and within target range for patient.Marland Kitchen Respirations regular, non-labored and within target range.. Temperature is normal and within the  target range for the patient.Marland Kitchen appears in no distress. Eyes Conjunctivae clear. No discharge. Respiratory Respiratory effort is easy and symmetric bilaterally. Rate is normal at rest and on room air.. Cardiovascular Pedal pulses palpable and strong bilaterally.. No major edema. Lymphatic None palpable in the popliteal or inguinal area. Notes Wound exam; the left lateral wound still has a large surface area and a fair amount of adherent slough. I vigorously scrubbed this area but we certainly don't want to debride it. This looks like a wound that could benefit from some Santyl topically. He does have some surrounding epithelialization which I think might progress of the surface was better. There is no evidence of infection. We checked his CBG today at 103 Electronic Signature(s) Signed: 06/11/2017 5:00:32 PM By: Baltazar Najjar MD Entered By: Baltazar Najjar on 06/11/2017 08:53:27 [name] ([id number]) -------------------------------------------------------------------------------- Physician Orders Details Patient Name: [name] Date of Service: 06/11/2017 8:15 AM Medical Record Number: [id number] Patient Account Number: [id number] Date of Birth/Sex: 01/07/1981 (37 y.o. Male) Treating RN: Renne Crigler Primary Care Provider: [name] Other Clinician: Referring Provider: [name] Treating Provider/Extender: Altamese [place] in Treatment: 62 Verbal / Phone Orders: No Diagnosis Coding Blood Glucose Testing Wound #1 Left,Lateral Lower Leg o Finger stick in clinic as a component of the assessment of chronic ulcer - results 103 Wound #2 Right,Posterior Lower Leg o Finger stick in clinic as a component of the assessment of chronic ulcer - results 103 Wound Cleansing Wound #1 Left,Lateral Lower Leg o Clean wound with Normal Saline. Wound #2 Right,Posterior Lower Leg o Clean wound with Normal Saline. Anesthetic (add to Medication List) Wound #1  Left,Lateral Lower Leg o Topical Lidocaine 4% cream applied to wound bed prior to debridement (In Clinic Only). Wound #2 Right,Posterior Lower Leg o Topical Lidocaine 4% cream applied to wound bed prior to debridement (In Clinic Only). Primary Wound Dressing Wound #1 Left,Lateral Lower Leg o Other: - triple antibiotic cream to open wound area Wound #2 Right,Posterior Lower Leg o Other: - triple antibiotic cream Secondary Dressing Wound #1 Left,Lateral Lower Leg o Boardered Foam Dressing Wound #2 Right,Posterior Lower Leg o Boardered Foam Dressing Dressing Change Frequency Wound #1 Left,Lateral Lower Leg o Change dressing every day. Wound #2 Right,Posterior Lower Leg o Change dressing every day. Follow-up Appointments [name], [name] ([id number]) o Other: - appointment in 5 weeks per Dr. Leanord Hawking Edema Control Wound #1 Left,Lateral Lower Leg o Patient to wear own compression stockings Wound #2 Right,Posterior Lower Leg o Patient to wear own compression stockings Electronic Signature(s) Signed: 06/11/2017 4:36:13 PM By: Renne Crigler Signed: 06/11/2017 5:00:32 PM By: Baltazar Najjar MD Entered By: Renne Crigler on 06/11/2017 09:03:18  [name], [name] ([id number]) -------------------------------------------------------------------------------- Problem List Details Patient Name: [name], [name] Date of Service: 06/11/2017 8:15 AM Medical Record Number: [id number] Patient Account Number: [id number] Date of Birth/Sex: May 02, 1981 (37 y.o. 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 12/04/2016 Yes L88 Pyoderma gangrenosum 03/26/2017 Yes I87.2 Venous insufficiency (chronic)  (peripheral) 12/04/2016 Yes Inactive Problems ICD-10 Code Description Active Date Inactive Date L97.213 Non-pressure chronic ulcer of right calf with necrosis of muscle 04/02/2017 04/02/2017 Resolved Problems Electronic Signature(s) Signed: 06/11/2017 5:00:32 PM By: Baltazar Najjar MD Entered By: Baltazar Najjar on 06/11/2017 08:49:51 [name] ([id number]) -------------------------------------------------------------------------------- Progress Note Details Patient Name: [name] Date of Service: 06/11/2017 8:15 AM Medical Record Number: [id number] Patient Account Number: [id number] Date of Birth/Sex: Nov 13, 1980 (37 y.o. Male) Treating RN: Renne Crigler Primary Care Provider: [name] Other Clinician: Referring Provider: [name] Treating Provider/Extender: Maxwell Caul Weeks in Treatment: 27 Subjective History of Present Illness (HPI) 12/04/16; 37 year old man who comes into the clinic today for review of a wound on the posterior left calf. He tells me that is been there for about a year. He is not a diabetic he does smoke half a pack per day. He was seen in the ER on 11/20/16 felt to have cellulitis around the wound and was given clindamycin. An x-ray did not show osteomyelitis. The patient initially tells me that he has a milk allergy that sets off a pruritic itching rash on his lower legs which she scratches incessantly and he thinks that's what may have set up the wound. He has been using various topical antibiotics and ointments without any effect. He works in a [employer]ing [employer] and is on his feet all day. He does not have a prior history of wounds however he does have the rash on both lower legs the right arm and the ventral aspect of his left arm. These are excoriations and clearly have had scratching however there are of macular looking areas on both legs including a substantial larger area on the right leg. This does not have an underlying open area.  There is no blistering. 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 He seemed to recognize this. 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 He is not in any treatment for this. He's had no recent travel 12/11/16; the patient arrives today with his wound and roughly the same condition we've been using silver alginate this is a deep punched out wound with some surrounding erythema but no tenderness. Biopsy I did did not show confirmed pyoderma gangrenosum suggested nonspecific inflammation and vasculitis but does not provide an actual description of what was seen by the pathologist. I'm really not able to understand this We have also received information from the patient's dermatologist in [place] notes from April 2016. This was a doctor Agarwal- antal. 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 He was prescribed topical steroid high potency under occlusion which helped but at this point the patient did not have a deep punched out wound. 12/18/16; the patient's wound is larger in terms of  surface area however this surface looks better and there is less depth. The surrounding erythema also is better. The patient states that the wrap we put on came off 2 days ago when he has been using his compression stockings. He we are in the process of getting a dermatology consult. 12/26/16 on evaluation today patient's left lower extremity wound shows evidence of infection with surrounding erythema noted. He has been tolerating the dressing changes but states that he has noted more discomfort. There is a larger area of erythema surrounding the wound. No fevers, chills, nausea, or vomiting noted at this time. With that being said the wound still does have slough covering the surface. He is  not allergic to any medication that he is aware of at this point. In regard to his right lower extremity he had several regions that are erythematous and pruritic he wonders if there's anything we can do to help that. 01/02/17 I reviewed patient's wound culture which was obtained his visit last week. He was placed on doxycycline at that point. Unfortunately that does not appear to be an antibiotic that would likely help with the situation however the pseudomonas noted on culture is sensitive to Cipro. Also unfortunately patient's wound seems to have a large compared to last week's evaluation. Not severely so but there are definitely increased measurements in general. He is continuing to have discomfort as well he writes this to be a seven out of 10. In fact he would prefer me not to perform any debridement today due to the fact that he is having discomfort and considering he has an active infection on the little reluctant to do so anyway. No fevers, chills, nausea, or vomiting noted at this time. 01/08/17; patient seems dermatology on September 5. I suspect dermatology will want the slides from the biopsy I did sent to their pathologist. I'm not sure if there is a way we can expedite that. In any case the culture I did before I left on vacation 3 weeks ago showed Pseudomonas he was given 10 days of Cipro and per her description of her intake nurses is actually somewhat better this week although the wound is quite a bit bigger than I remember the last time I saw this. He still has 3 [place], Janathan ([id number]) more days of Cipro 01/21/17; dermatology appointment tomorrow. He has completed the ciprofloxacin for Pseudomonas. Surface of the wound looks better however he is had some deterioration in the lesions on his right leg. Meantime the left lateral leg wound we will continue with sample 01/29/17; patient had his dermatology appointment but I can't yet see that note. He is completed his antibiotics. The  wound is more superficial but considerably larger in circumferential area than when he came in. This is in his left lateral calf. He also has swollen erythematous areas with superficial wounds on the right leg and small papular areas on both arms. There apparently areas in her his upper thighs and buttocks I did not look at those. Dermatology biopsied the right leg. Hopefully will have their input next week. 02/05/17; patient went back to see his dermatologist who told him that he had a "scratching problem" as well as staph. He is now on a 30 day course of doxycycline and I believe she gave him triamcinolone cream to the right leg areas to help with the itching [not exactly sure but probably triamcinolone]. She apparently looked at the left lateral leg wound although this was not rebiopsied and I think felt  to be ultimately part of the same pathogenesis. He is using sample border foam and changing nevus himself. He now has a new open area on the right posterior leg which was his biopsy site I don't have any of the dermatology notes 02/12/17; we put the patient in compression last week with SANTYL to the wound on the left leg and the biopsy. Edema is much better and the depth of the wound is now at level of skin. Area is still the same Biopsy site on the right lateral leg we've also been using santyl with a border foam dressing and he is changing this himself. 02/19/17; Using silver alginate started last week to both the substantial left leg wound and the biopsy site on the right wound. He is tolerating compression well. Has a an appointment with his primary M.D. tomorrow wondering about diuretics although I'm wondering if the edema problem is actually lymphedema 02/26/17; the patient has been to see his primary doctor Dr. Jerelyn CharlesKathryn Clark at [facility] our primary care. She started him on Lasix 20 mg and this seems to have helped with the edema. However we are not making substantial change with the  left lateral calf wound and inflammation. The biopsy site on the right leg also looks stable but not really all that different. 03/12/17; the patient has been to see vein and vascular Dr. Wyn QuakerEW. He has had venous reflux studies I have not reviewed these. I did get a call from his dermatology office. 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 They therefore recommended clobetasol and tetracycline i.e. 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 03/26/17; apparently the patient just had reflux studies not an appointment with Dr. dew. She arrives in clinic today having applied clobetasol for 2-3 weeks. He notes over the last 2-3 days excessive drainage having to change the dressing 3-4 times a day and also expanding erythema. 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 Carles [facility]stein [facility] who kindly saw him at my request. 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 My concern was the degree of erythema medially extending from his left leg wound which was either inflammation from pyoderma or cellulitis. I put him on Augmentin however culture of the wound showed Pseudomonas which is quinolone sensitive. I really don't believe he has cellulitis however in view of everything I will continue and give him a course of Cipro. He is also on doxycycline as an immune modulator for the pyoderma. In addition to  his original wound on the left lateral leg with surrounding erythema he has a wound on the right posterior calf which was an original biopsy site done by dermatology. 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 Saw Dr. Carles ColletPearlstein yesterday. He has been using topical antibiotics to both wound areas his original wound on the left and the biopsies/pathergy area on the right. There is definitely some improvement in  the inflammation around the wound on the right although the patient states he has increasing sensitivity of the wounds. He is on prednisone 60 and doxycycline 1 as prescribed by Dr. Carles Collet. He is covering the topical antibiotic with gauze and putting this in his own compression stocks and changing this daily. He states that Dr. Aquilla Hacker did a culture of the left leg wound yesterday 05/07/17; pyoderma gangrenosum. The patient saw Dr. Carles Collet yesterday and has a follow-up with her in one month. He is still using topical antibiotics to both wounds although he can't recall exactly what type. He is still on prednisone 60 mg. Dr. Carles Collet stated that the doxycycline could stop if we were in agreement. 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 The right medial leg was induced by biopsy/pathergy. The area on the right is essentially healed. Still on high-dose prednisone using topical antibiotics to the wound [name], [name] ([id number]) Objective Constitutional Patient is hypertensive.. Pulse regular and within target range for patient.Marland Kitchen Respirations regular, non-labored and within target range.. Temperature is normal and within the target range for the patient.Marland Kitchen appears in no distress. Vitals Time Taken: 8:25 AM, Height: 71 in, Weight: 338 lbs, BMI: 47.1, Temperature: 98.2 F, Pulse: 76 bpm, Respiratory Rate: 18 breaths/min, Blood  Pressure: 165/86 mmHg, Capillary Blood Glucose: 103 mg/dl. Eyes Conjunctivae clear. No discharge. Respiratory Respiratory effort is easy and symmetric bilaterally. Rate is normal at rest and on room air.. Cardiovascular Pedal pulses palpable and strong bilaterally.. No major edema. Lymphatic None palpable in the popliteal or inguinal area. General Notes: Wound exam; the left lateral wound still has a large surface area and a fair amount of adherent slough. I vigorously scrubbed this area but we certainly don't want to debride it. This looks like a wound that could benefit from some Santyl topically. He does have some surrounding epithelialization which I think might progress of the surface was better. There is no evidence of infection. We checked his CBG today at 103 Integumentary (Hair, Skin) Wound #1 status is Open. Original cause of wound was Gradually Appeared. The wound is located on the Left,Lateral Lower Leg. The wound measures 6.1cm length x 5.1cm width x 0.3cm depth; 24.434cm^2 area and 7.33cm^3 volume. There is Fat Layer (Subcutaneous Tissue) Exposed exposed. There is no tunneling or undermining noted. There is a medium amount of serosanguineous drainage noted. The wound margin is distinct with the outline attached to the wound base. There is medium (34-66%) red granulation within the wound bed. There is a medium (34-66%) amount of necrotic tissue within the wound bed including Adherent Slough. The periwound skin appearance exhibited: Induration, Erythema. The periwound skin appearance did not exhibit: Callus, Crepitus, Excoriation, Rash, Scarring, Dry/Scaly, Maceration, Atrophie Blanche, Cyanosis, Ecchymosis, Hemosiderin Staining, Mottled, Pallor, Rubor. The surrounding wound skin color is noted with erythema which is circumferential. Periwound temperature was noted as No Abnormality. The periwound has tenderness on palpation. Wound #2 status is Open. Original cause of wound was  Surgical Injury. The wound is located on the Right,Posterior Lower Leg. The wound measures 0.2cm length x 0.2cm width x 0.1cm depth; 0.031cm^2 area and 0.003cm^3 volume. There is Fat Layer (Subcutaneous Tissue) Exposed exposed. There is no tunneling or undermining noted. There is a none present amount of drainage noted. The wound margin is distinct with the outline attached to the wound base. There is large (67-100%) red granulation within the wound bed.  There is no necrotic tissue within the wound bed. The periwound skin appearance exhibited: Scarring. The periwound skin appearance did not exhibit: Callus, Crepitus, Excoriation, Induration, Rash, Dry/Scaly, Maceration, Atrophie Blanche, Cyanosis, Ecchymosis, Hemosiderin Staining, Mottled, Pallor, Rubor, Erythema. Periwound temperature was noted as No Abnormality. The periwound has tenderness on palpation. [name], [name] ([id number]) Assessment Active Problems ICD-10 [phone] - 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sis of muscle L88 - Pyoderma gangrenosum I87.2 - Venous insufficiency (chronic) (peripheral) Plan Blood Glucose Testing: Wound #1 Left,Lateral Lower Leg: Finger stick in clinic as a component of the assessment of chronic ulcer - results 103 Wound #2 Right,Posterior Lower Leg: Finger stick in clinic as a component of the assessment of chronic ulcer - results 103 Wound Cleansing: Wound #1 Left,Lateral Lower Leg: Clean wound with Normal Saline. Wound #2 Right,Posterior Lower Leg: Clean wound with Normal Saline. Anesthetic (add to Medication List): Wound #1 Left,Lateral Lower Leg: Topical Lidocaine 4% cream applied to wound bed prior to debridement (In Clinic Only). Wound #2 Right,Posterior Lower Leg: Topical Lidocaine 4% cream applied to wound bed prior to debridement (In Clinic Only). Primary Wound Dressing: Wound #1 Left,Lateral Lower Leg: Other: - triple antibiotic cream to open wound area Wound #2  Right,Posterior Lower Leg: Other: - triple antibiotic cream Secondary Dressing: Wound #1 Left,Lateral Lower Leg: Boardered Foam Dressing Wound #2 Right,Posterior Lower Leg: Boardered Foam Dressing Dressing Change Frequency: Wound #1 Left,Lateral Lower Leg: Change dressing every day. Wound #2 Right,Posterior Lower Leg: Change dressing every day. Follow-up Appointments: Other: - appointment in 5 weeks per Dr. Leanord Hawking Edema Control: Wound #1 Left,Lateral Lower Leg: Patient to wear own compression stockings Wound #2 Right,Posterior Lower Leg: Patient to wear own compression stockings [name], [name] ([id number]) o o o #1 I'm going to check with Dr. Carles Collet about using Santyl on the wound which shouldn't really affect any thing else that I am aware of. #2 currently using topical antibiotics #3 using his own stocking #4 he looks more cushingoid but still has several more months of high-dose steroids #5 the wound on the right medial leg is from intense and purposes healed. The area on the left leg looks better although there is still a substantial wound surface Electronic Signature(s) Signed: 06/16/2017 12:49:27 PM By: Elliot Gurney, [name], RN, [name], [name], [name] Signed: 06/25/2017 2:37:44 PM By: Baltazar Najjar MD Previous Signature: 06/11/2017 5:00:32 PM Version By: Baltazar Najjar MD Entered By: Elliot Gurney, [name], RN, [name], [name] on 06/16/2017 12:49:26 [name], [name] ([id number]) -------------------------------------------------------------------------------- SuperBill Details Patient Name: [name] Date of Service: 06/11/2017 Medical Record Number: [id number] Patient Account Number: [id number] Date of Birth/Sex: 1980/10/20 (37 y.o. Male) Treating RN: Renne Crigler Primary Care Provider: [name] Other Clinician: Referring Provider: [name] Treating Provider/Extender: Maxwell Caul Weeks in Treatment: 27 Diagnosis Coding ICD-10 Codes Code Description [phone] Non-pressure  chronic ulcer of left calf with necrosis of muscle L88 Pyoderma gangrenosum I87.2 Venous insufficiency (chronic) (peripheral) Facility Procedures CPT4 Code: [id number] Description: [phone] - WOUND CARE VISIT-LEV 2 EST PT Modifier: Quantity: 1 Physician Procedures CPT4 Code: [id number] Description: 99213 - WC PHYS LEVEL 3 - EST PT ICD-10 Diagnosis Description L97.223 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sis of m L88 Pyoderma gangrenosum Modifier: uscle Quantity: 1 Electronic Signature(s) Signed: 06/11/2017 4:36:13 PM By: Renne Crigler Signed: 06/11/2017 5:00:32 PM By: Baltazar Najjar MD Entered By: Renne Crigler on 06/11/2017 09:00:18

## 2017-06-12 NOTE — Progress Notes (Signed)
DALLIS, CZAJA ([id number]) Visit Report for 06/11/2017 Arrival Information Details Patient Name: [name], [name] Date of Service: 06/11/2017 8:15 AM Medical Record Number: [id number] Patient Account Number: [id number] Date of Birth/Sex: Feb 13, 1981 (37 y.o. Male) Treating RN: Roger Shelter Primary Care [name]: [name] Other Clinician: Referring [name]: [name] Treating Aaliya Maultsby/Extender: Tito Dine in Treatment: 16 Visit Information History Since Last Visit All ordered tests and consults were completed: No Patient Arrived: Ambulatory Added or deleted any medications: No Arrival Time: 08:16 Any new allergies or adverse reactions: No Accompanied By: self Had a fall or experienced change in No Transfer Assistance: None activities of daily living that may affect Patient Identification Verified: Yes risk of falls: Secondary Verification Process Completed: Yes Signs or symptoms of abuse/neglect since last visito No Patient Requires Transmission-Based No Hospitalized since last visit: No Precautions: Pain Present Now: Yes Patient Has Alerts: No Electronic Signature(s) Signed: 06/11/2017 4:36:13 PM By: Roger Shelter Entered By: Roger Shelter on 06/11/2017 08:17:13 [name], [name] ([id number]) -------------------------------------------------------------------------------- Clinic Level of Care Assessment Details Patient Name: [name], [name] Date of Service: 06/11/2017 8:15 AM Medical Record Number: [id number] Patient Account Number: [id number] Date of Birth/Sex: [birth year] (37 y.o. Male) Treating RN: Roger Shelter Primary Care [name]: [name] Other Clinician: Referring [name]: [name] Treating Darron Stuck/Extender: Tito Dine in Treatment: 27 Clinic Level of Care Assessment Items TOOL 4 Quantity Score []  - Use when only an EandM is performed on FOLLOW-UP visit 0 ASSESSMENTS - Nursing Assessment / Reassessment X -  Reassessment of Co-morbidities (includes updates in patient status) 1 10 X- 1 5 Reassessment of Adherence to Treatment Plan ASSESSMENTS - Wound and Skin Assessment / Reassessment X - Simple Wound Assessment / Reassessment - one wound 1 5 []  - 0 Complex Wound Assessment / Reassessment - multiple wounds []  - 0 Dermatologic / Skin Assessment (not related to wound area) ASSESSMENTS - Focused Assessment []  - Circumferential Edema Measurements - multi extremities 0 []  - 0 Nutritional Assessment / Counseling / Intervention []  - 0 Lower Extremity Assessment (monofilament, tuning fork, pulses) []  - 0 Peripheral Arterial Disease Assessment (using hand held doppler) ASSESSMENTS - Ostomy and/or Continence Assessment and Care []  - Incontinence Assessment and Management 0 []  - 0 Ostomy Care Assessment and Management (repouching, etc.) PROCESS - Coordination of Care X - Simple Patient / Family Education for ongoing care 1 15 []  - 0 Complex (extensive) Patient / Family Education for ongoing care []  - 0 Staff obtains Programmer, systems, Records, Test Results / Process Orders []  - 0 Staff telephones HHA, Nursing Homes / Clarify orders / etc []  - 0 Routine Transfer to another Facility (non-emergent condition) []  - 0 Routine Hospital Admission (non-emergent condition) []  - 0 New Admissions / Biomedical engineer / Ordering NPWT, Apligraf, etc. []  - 0 Emergency Hospital Admission (emergent condition) X- 1 10 Simple Discharge Coordination [name], [name] ([id number]) []  - 0 Complex (extensive) Discharge Coordination PROCESS - Special Needs []  - Pediatric / Minor Patient Management 0 []  - 0 Isolation Patient Management []  - 0 Hearing / Language / Visual special needs []  - 0 Assessment of Community assistance (transportation, D/C planning, etc.) []  - 0 Additional assistance / Altered mentation []  - 0 Support Surface(s) Assessment (bed, cushion, seat, etc.) INTERVENTIONS - Wound Cleansing /  Measurement X - Simple Wound Cleansing - one wound 1 5 []  - 0 Complex Wound Cleansing - multiple wounds X- 1 5 Wound Imaging (photographs - any number of wounds) []  - 0 Wound Tracing (instead of photographs)  X- 1 5 Simple Wound Measurement - one wound []  - 0 Complex Wound Measurement - multiple wounds INTERVENTIONS - Wound Dressings X - Small Wound Dressing one or multiple wounds 1 10 []  - 0 Medium Wound Dressing one or multiple wounds []  - 0 Large Wound Dressing one or multiple wounds []  - 0 Application of Medications - topical []  - 0 Application of Medications - injection INTERVENTIONS - Miscellaneous []  - External ear exam 0 []  - 0 Specimen Collection (cultures, biopsies, blood, body fluids, etc.) []  - 0 Specimen(s) / Culture(s) sent or taken to Lab for analysis []  - 0 Patient Transfer (multiple staff / Civil Service fast streamer / Similar devices) []  - 0 Simple Staple / Suture removal (25 or less) []  - 0 Complex Staple / Suture removal (26 or more) []  - 0 Hypo / Hyperglycemic Management (close monitor of Blood Glucose) []  - 0 Ankle / Brachial Index (ABI) - do not check if billed separately X- 1 5 Vital Signs [name], [name] ([id number]) Has the patient been seen at the hospital within the last three years: Yes Total Score: 75 Level Of Care: New/Established - Level 2 Electronic Signature(s) Signed: 06/11/2017 4:36:13 PM By: Roger Shelter Entered By: Roger Shelter on 06/11/2017 08:59:56 [name], [name] ([id number]) -------------------------------------------------------------------------------- Complex / Palliative Patient Assessment Details Patient Name: [name] Date of Service: 06/11/2017 8:15 AM Medical Record Number: [id number] Patient Account Number: [id number] Date of Birth/Sex: 1980/10/29 (37 y.o. Male) Treating RN: [name] Primary Care [name]: [name] Other Clinician: Referring [name]: [name] Treating Caitlynn Ju/Extender: Ricard Dillon Weeks in Treatment: 27 Palliative Management Criteria Complex Wound Management Criteria Patient has remarkable or complex co-morbidities requiring medications or treatments that extend wound healing times. Examples: o Diabetes mellitus with chronic renal failure or end stage renal disease requiring dialysis o Advanced or poorly controlled rheumatoid arthritis o Diabetes mellitus and end stage chronic obstructive pulmonary disease o Active cancer with current chemo- or radiation therapy Pyoderma Gangrenosum Care Approach Wound Care Plan: Complex Wound Management Electronic Signature(s) Signed: 06/11/2017 10:41:34 AM By: [name], [name], RN, [name], [name], [name] Signed: 06/11/2017 5:00:32 PM By: Linton Ham MD Entered By: [name], [name], RN, [name], [name] on 06/11/2017 10:41:33 [name] ([id number]) -------------------------------------------------------------------------------- Encounter Discharge Information Details Patient Name: [name] Date of Service: 06/11/2017 8:15 AM Medical Record Number: [id number] Patient Account Number: [id number] Date of Birth/Sex: 1981/02/14 (37 y.o. Male) Treating RN: Roger Shelter Primary Care Andre Gallego: [name] Other Clinician: Referring Kyleena Scheirer: [name] Treating Siennah Barrasso/Extender: Tito Dine in Treatment: 82 Encounter Discharge Information Items Discharge Pain Level: 0 Discharge Condition: Stable Ambulatory Status: Ambulatory Discharge Destination: Home Transportation: Private Auto Accompanied By: self Schedule Follow-up Appointment: Yes Medication Reconciliation completed and No provided to Patient/Care Keeli Roberg: Patient Clinical Summary of Care: Declined Electronic Signature(s) Signed: 06/11/2017 4:36:13 PM By: Roger Shelter Entered By: Roger Shelter on 06/11/2017 09:04:21 [name] ([id number]) -------------------------------------------------------------------------------- Lower  Extremity Assessment Details Patient Name: [name] Date of Service: 06/11/2017 8:15 AM Medical Record Number: [id number] Patient Account Number: [id number] Date of Birth/Sex: Sep 19, 1980 (37 y.o. Male) Treating RN: Roger Shelter Primary Care Aaminah Forrester: [name] Other Clinician: Referring Aeric Burnham: [name] Treating Teandra Harlan/Extender: Ricard Dillon Weeks in Treatment: 27 Edema Assessment Assessed: [Left: No] [Right: No] Edema: [Left: N] [Right: o] Vascular Assessment Claudication: Claudication Assessment [Left:None] Pulses: Dorsalis Pedis Palpable: [Left:Yes] Posterior Tibial Extremity colors, hair growth, and conditions: Extremity Color: [Left:Hyperpigmented] Hair Growth on Extremity: [Left:Yes] Temperature of Extremity: [Left:Warm] Capillary Refill: [Left:< 3 seconds] Toe Nail  Assessment Left: Right: Thick: No Discolored: No Deformed: No Improper Length and Hygiene: No Electronic Signature(s) Signed: 06/11/2017 4:36:13 PM By: Roger Shelter Entered By: Roger Shelter on 06/11/2017 08:30:45 [name], [name] ([id number]) -------------------------------------------------------------------------------- Multi Wound Chart Details Patient Name: [name] Date of Service: 06/11/2017 8:15 AM Medical Record Number: [id number] Patient Account Number: [id number] Date of Birth/Sex: 10/24/1980 (37 y.o. 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] [2:Open Surgical Wound] [N/A:N/A] Comorbid History: [1:Sleep Apnea, Hypertension, Colitis] [2:Sleep Apnea, Hypertension, Colitis] [N/A:N/A] Date Acquired: [1:11/18/2015] [2:02/02/2017] [N/A:N/A] Weeks of Treatment: [1:27] [2:18] [N/A:N/A] Wound Status: [1:Open] [2:Open] [N/A:N/A] Measurements L x W x D [1:6.1x5.1x0.3] [2:0.2x0.2x0.1] [N/A:N/A] (cm) Area (cm) : [1:24.434] [2:0.031] [N/A:N/A] Volume (cm) : [1:7.33] [2:0.003] [N/A:N/A] % Reduction in Area: [1:-397.70%] [2:93.70%] [N/A:N/A] % Reduction in Volume: [1:-86.70%] [2:97.00%] [N/A:N/A] Classification: [1:Full Thickness Without Exposed Support Structures] [2:Partial Thickness] [N/A:N/A] Exudate Amount: [1:Medium] [2:None Present] [N/A:N/A] Exudate Type: [1:Serosanguineous] [2:N/A] [N/A:N/A] Exudate Color: [1:red, brown] [2:N/A] [N/A:N/A] Wound Margin: [1:Distinct, outline attached] [2:Distinct, outline attached] [N/A:N/A] Granulation Amount: [1:Medium (34-66%)] [2:Large (67-100%)] [N/A:N/A] Granulation Quality: [1:Red] [2:Red] [N/A:N/A] Necrotic Amount: [1:Medium (34-66%)] [2:None Present (0%)] [N/A:N/A] Exposed Structures: [1:Fat Layer (Subcutaneous Tissue) Exposed: Yes Fascia: No Tendon: No Muscle: No Joint: No Bone: No] [2:Fat Layer (Subcutaneous Tissue) Exposed: Yes Fascia: No Tendon: No Muscle: No Joint: No Bone: No] [N/A:N/A] Epithelialization: [1:None] [2:Large (67-100%)] [N/A:N/A] Periwound Skin Texture: [1:Induration: Yes Excoriation: No Callus: No Crepitus: No] [2:Scarring: Yes Excoriation: No Induration: No Callus: No] [N/A:N/A] Rash: No Crepitus: No Scarring: No Rash: No Periwound Skin Moisture: Maceration: No Maceration: No N/A Dry/Scaly: No Dry/Scaly: No Periwound Skin Color: Erythema: Yes Atrophie Blanche: No N/A Atrophie Blanche: No Cyanosis: No Cyanosis: No Ecchymosis: No Ecchymosis: No Erythema: No Hemosiderin Staining: No Hemosiderin Staining:  No Mottled: No Mottled: No Pallor: No Pallor: No Rubor: No Rubor: No Erythema Location: Circumferential N/A N/A Erythema Change: Increased N/A N/A Temperature: No Abnormality No Abnormality N/A Tenderness on Palpation: Yes Yes N/A Wound Preparation: Ulcer Cleansing: Ulcer Cleansing: N/A Rinsed/Irrigated with Saline Rinsed/Irrigated with Saline Topical Anesthetic Applied: Topical Anesthetic Applied: None, Other: Lidocaine 4% None, Other: Lidocaine 4% Treatment Notes Electronic Signature(s) Signed: 06/11/2017 5:00:32 PM By: Linton Ham MD Entered By: Linton Ham on 06/11/2017 [name], [name] ([id number]) -------------------------------------------------------------------------------- [place] Details Patient Name: [name] Date of Service: 06/11/2017 8:15 AM Medical Record Number: [id number] Patient Account Number: [id number] Date of Birth/Sex: [birth year] (37 y.o. Male) Treating RN: Roger Shelter Primary Care Tyion Boylen: [name] Other Clinician: Referring Yaiza Palazzola: [name] Treating Sura Canul/Extender: Ricard Dillon Weeks in Treatment: 21 Active Inactive ` Orientation to the Wound Care Program Nursing Diagnoses: Knowledge deficit related to the wound healing center program Goals: Patient/caregiver will verbalize understanding of the [place] Program Date Initiated: 12/11/2016 Target Resolution Date: 03/13/2017 Goal Status: Active Interventions: Provide education on orientation to the wound center Notes: ` Venous Leg Ulcer Nursing Diagnoses: Knowledge deficit related to disease process and management Potential for venous Insuffiency (use before diagnosis confirmed) Goals: Non-invasive venous studies are completed as ordered Date Initiated: 12/11/2016 Target Resolution Date: 03/13/2017 Goal Status: Active Patient will  maintain optimal edema control Date Initiated: 12/11/2016 Target Resolution Date:  03/13/2017 Goal Status: Active Patient/caregiver will verbalize understanding of disease process and disease management Date Initiated: 12/11/2016 Target Resolution Date: 03/13/2017 Goal Status: Active Verify adequate tissue perfusion prior to therapeutic compression application Date Initiated: 12/11/2016 Target Resolution Date: 03/13/2017 Goal Status: Active Interventions: Assess peripheral edema status every visit. Compression as ordered Provide education on venous insufficiency Treatment Activities: [name], [name] ([id number]) Therapeutic compression applied : 12/11/2016 Notes: ` Wound/Skin Impairment Nursing Diagnoses: Impaired tissue integrity Knowledge deficit related to ulceration/compromised skin integrity Goals: Patient/caregiver will verbalize understanding of skin care regimen Date Initiated: 12/11/2016 Target Resolution Date: 03/13/2017 Goal Status: Active Ulcer/skin breakdown will have a volume reduction of 30% by week 4 Date Initiated: 12/11/2016 Target Resolution Date: 03/13/2017 Goal Status: Active Ulcer/skin breakdown will have a volume reduction of 50% by week 8 Date Initiated: 12/11/2016 Target Resolution Date: 03/13/2017 Goal Status: Active Ulcer/skin breakdown will have a volume reduction of 80% by week 12 Date Initiated: 12/11/2016 Target Resolution Date: 03/13/2017 Goal Status: Active Ulcer/skin breakdown will heal within 14 weeks Date Initiated: 12/11/2016 Target Resolution Date: 03/13/2017 Goal Status: Active Interventions: Assess patient/caregiver ability to obtain necessary supplies Assess patient/caregiver ability to perform ulcer/skin care regimen upon admission and as needed Assess ulceration(s) every visit Provide education on ulcer and skin care Treatment Activities: Skin care regimen initiated : 12/11/2016 Topical wound management initiated : 12/11/2016 Notes: Electronic Signature(s) Signed: 06/11/2017 4:36:13 PM By: Roger Shelter Entered  By: Roger Shelter on 06/11/2017 08:30:52 [name] ([id number]) -------------------------------------------------------------------------------- Pain Assessment Details Patient Name: [name] Date of Service: 06/11/2017 8:15 AM Medical Record Number: [id number] Patient Account Number: [id number] Date of Birth/Sex: [birth year] (37 y.o. Male) Treating RN: Roger Shelter Primary Care Veronnica Hennings: [name] Other Clinician: Referring Kirk Basquez: [name] Treating Dakwan Pridgen/Extender: Ricard Dillon Weeks in Treatment: 27 Active Problems Location of Pain Severity and Description of Pain Patient Has Paino Yes Site Locations Rate the pain. Current Pain Level: 2 Character of Pain Describe the Pain: Tender Pain Management and Medication Current Pain Management: Electronic Signature(s) Signed: 06/11/2017 4:36:13 PM By: Roger Shelter Entered By: Roger Shelter on 06/11/2017 08:17:27 [name] ([id number]) -------------------------------------------------------------------------------- Patient/Caregiver Education Details Patient Name: [name] Date of Service: 06/11/2017 8:15 AM Medical Record Number: [id number] Patient Account Number: [id number] Date of Birth/Gender: 10-Oct-1980 (37 y.o. Male) Treating RN: Roger Shelter Primary Care Physician: [name] Other Clinician: Referring Physician: [name] Treating Physician/Extender: Tito Dine in Treatment: 59 Education Assessment Education Provided To: Patient Education Topics Provided Wound/Skin Impairment: Handouts: Caring for Your Ulcer Methods: Explain/Verbal Responses: State content correctly Electronic Signature(s) Signed: 06/11/2017 4:36:13 PM By: Roger Shelter Entered By: Roger Shelter on 06/11/2017 09:04:32 [name] ([id number]) -------------------------------------------------------------------------------- Wound Assessment Details Patient Name: [name] Date of Service: 06/11/2017 8:15 AM Medical Record Number: [id number] Patient Account Number: [id number] Date of Birth/Sex: [birth year] (37 y.o. Male) Treating RN: Roger Shelter Primary Care Aliceson Dolbow: [name] Other Clinician: Referring Bernita Beckstrom: [name] Treating [name] Petrakis/Extender: Ricard Dillon Weeks in Treatment: 27 Wound Status Wound Number: 1 Primary Etiology: Pyoderma Wound Location: Left Lower Leg - Lateral Wound Status: Open Wounding Event: Gradually Appeared Comorbid History: Sleep Apnea, Hypertension, Colitis Date Acquired: 11/18/2015 Weeks Of Treatment: 27 Clustered Wound: No Photos Photo Uploaded By: Roger Shelter on 06/11/2017 09:14:37 Wound Measurements Length: (cm) 6.1 Width: (cm) 5.1 Depth: (cm) 0.3 Area: (cm) 24.434 Volume: (cm) 7.33 % Reduction in Area: -397.7% % Reduction in Volume: -86.7% Epithelialization: None Tunneling: No Undermining:  No Wound Description Full Thickness Without Exposed Support Classification: Structures Wound Margin: Distinct, outline attached Exudate Medium Amount: Exudate Type: Serosanguineous Exudate Color: red, brown Foul Odor After Cleansing: No Slough/Fibrino Yes Wound Bed Granulation Amount: Medium (34-66%) Exposed Structure Granulation Quality: Red Fascia Exposed: No Necrotic Amount: Medium (34-66%) Fat Layer (Subcutaneous Tissue) Exposed: Yes Necrotic Quality: Adherent Slough Tendon Exposed: No Muscle Exposed: No Joint Exposed: No Bone Exposed: No [name], [name] ([id number]) Periwound Skin Texture Texture Color No Abnormalities Noted: No No Abnormalities Noted: No Callus: No Atrophie Blanche: No Crepitus: No Cyanosis: No Excoriation: No Ecchymosis: No Induration: Yes Erythema: Yes Rash: No Erythema Location: Circumferential Scarring: No Erythema Change: Increased Hemosiderin Staining: No Moisture Mottled: No No Abnormalities Noted: No Pallor: No Dry / Scaly:  No Rubor: No Maceration: No Temperature / Pain Temperature: No Abnormality Tenderness on Palpation: Yes Wound Preparation Ulcer Cleansing: Rinsed/Irrigated with Saline Topical Anesthetic Applied: None, Other: Lidocaine 4%, Treatment Notes Wound #1 (Left, Lateral Lower Leg) 1. Cleansed with: Clean wound with Normal Saline 5. Secondary Dressing Applied Bordered Foam Dressing Notes triple antibiotic cream to wounds Electronic Signature(s) Signed: 06/11/2017 4:36:13 PM By: Roger Shelter Entered By: Roger Shelter on 06/11/2017 08:29:01 [name], [name] ([id number]) -------------------------------------------------------------------------------- Wound Assessment Details Patient Name: [name] Date of Service: 06/11/2017 8:15 AM Medical Record Number: [id number] Patient Account Number: [id number] Date of Birth/Sex: [birth year] (37 y.o. Male) Treating RN: Roger Shelter Primary Care Chavon Lucarelli: [name] Other Clinician: Referring Darlys Buis: [name] Treating [place]ound Katich/Extender: Ricard Dillon Weeks in Treatment: 27 Wound Status Wound Number: 2 Primary Etiology: Open Surgical Wound Wound Location: Right Lower Leg - Posterior Wound Status: Open Wounding Event: Surgical Injury Comorbid History: Sleep Apnea, Hypertension, Colitis Date Acquired: 02/02/2017 Weeks Of Treatment: 18 Clustered Wound: No Photos Photo Uploaded By: Roger Shelter on 06/11/2017 09:14:38 Wound Measurements Length: (cm) 0.2 Width: (cm) 0.2 Depth: (cm) 0.1 Area: (cm) 0.031 Volume: (cm) 0.003 % Reduction in Area: 93.7% % Reduction in Volume: 97% Epithelialization: Large (67-100%) Tunneling: No Undermining: No Wound Description Classification: Partial Thickness Wound Margin: Distinct, outline attached Exudate Amount: None Present Foul Odor After Cleansing: No Slough/Fibrino No Wound Bed Granulation Amount: Large (67-100%) Exposed Structure Granulation Quality: Red Fascia  Exposed: No Necrotic Amount: None Present (0%) Fat Layer (Subcutaneous Tissue) Exposed: Yes Tendon Exposed: No Muscle Exposed: No Joint Exposed: No Bone Exposed: No Periwound Skin Texture Texture Color No Abnormalities Noted: No No Abnormalities Noted: No [name], [name] ([id number]) Callus: No Atrophie Blanche: No Crepitus: No Cyanosis: No Excoriation: No Ecchymosis: No Induration: No Erythema: No Rash: No Hemosiderin Staining: No Scarring: Yes Mottled: No Pallor: No Moisture Rubor: No No Abnormalities Noted: No Dry / Scaly: No Temperature / Pain Maceration: No Temperature: No Abnormality Tenderness on Palpation: Yes Wound Preparation Ulcer Cleansing: Rinsed/Irrigated with Saline Topical Anesthetic Applied: None, Other: Lidocaine 4%, Electronic Signature(s) Signed: 06/11/2017 4:36:13 PM By: Roger Shelter Entered By: Roger Shelter on 06/11/2017 08:29:45 [name] ([id number]) -------------------------------------------------------------------------------- Vitals Details Patient Name: [name] Date of Service: 06/11/2017 8:15 AM Medical Record Number: [id number] Patient Account Number: [id number] Date of Birth/Sex: 08/07/1980 (37 y.o. Male) Treating RN: Roger Shelter Primary Care Mckynna Vanloan: [name] Other Clinician: Referring Alonzo Loving: [name] Treating Neal Oshea/Extender: Ricard Dillon Weeks in Treatment: 27 Vital Signs Time Taken: 08:25 Temperature (F): 98.2 Height (in): 71 Pulse (bpm): 76 Weight (lbs): 338 Respiratory Rate (breaths/min): 18 Body Mass Index (BMI): 47.1 Blood Pressure (mmHg): 165/86 Capillary Blood Glucose (mg/dl): 103 Reference Range: 80 - 120 mg / dl Electronic Signature(s)  Signed: 06/11/2017 4:36:13 PM By: Roger Shelter Entered By: Roger Shelter on 06/11/2017 09:03:31

## 2017-07-09 ENCOUNTER — Encounter: Payer: 59 | Attending: Internal Medicine | Admitting: Internal Medicine

## 2017-07-09 DIAGNOSIS — S81802A Unspecified open wound, left lower leg, initial encounter: Secondary | ICD-10-CM | POA: Diagnosis not present

## 2017-07-09 DIAGNOSIS — L97223 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sis of muscle: Secondary | ICD-10-CM | POA: Diagnosis not present

## 2017-07-09 DIAGNOSIS — I872 Venous insufficiency (chronic) (peripheral): Secondary | ICD-10-CM | POA: Diagnosis not present

## 2017-07-09 DIAGNOSIS — L88 Pyoderma gangrenosum: Secondary | ICD-10-CM | POA: Insufficient documentation

## 2017-07-09 DIAGNOSIS — T8189XA Other complications of procedures, not elsewhere classified, initial encounter: Secondary | ICD-10-CM | POA: Diagnosis not present

## 2017-07-09 LAB — GLUCOSE, CAPILLARY: Glucose-Capillary: 180 mg/dL — ABNORMAL HIGH (ref 65–99)

## 2017-07-10 NOTE — Progress Notes (Signed)
[name] ([id number]) Visit Report for 07/09/2017 HPI Details Patient Name: [name], [name] Date of Service: 07/09/2017 8:00 AM Medical Record Number: [id number] Patient Account Number: [id number] Date of Birth/Sex: [birth year] (37 y.o. Male) Treating RN: Huel Coventry Primary Care Provider: [name] Other Clinician: Referring Provider: [name] Treating Provider/Extender: Maxwell Caul Weeks in Treatment: 31 History of Present Illness HPI Description: 12/04/16; 37 year old man who comes into the clinic today for review of a wound on the posterior left calf. He tells me that is been there for about a year. He is not a diabetic he does smoke half a pack per day. He was seen in the ER on 11/20/16 felt to have cellulitis around the wound and was given clindamycin. An x-ray did not show osteomyelitis. The patient initially tells me that he has a milk allergy that sets off a pruritic itching rash on his lower legs which she scratches incessantly and he thinks that's what may have set up the wound. He has been using various topical antibiotics and ointments without any effect. He works in a [employer]ing [employer] and is on his feet all day. He does not have a prior history of wounds however he does have the rash on both lower legs the right arm and the ventral aspect of his left arm. These are excoriations and clearly have had scratching however there are of macular looking areas on both legs including a substantial larger area on the right leg. This does not have an underlying open area. There is no blistering. 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 He seemed to recognize this. 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 He is not in any treatment for this. He's had no recent  travel 12/11/16; the patient arrives today with his wound and roughly the same condition we've been using silver alginate this is a deep punched out wound with some surrounding erythema but no tenderness. Biopsy I did did not show confirmed pyoderma gangrenosum suggested nonspecific inflammation and vasculitis but does not provide an actual description of what was seen by the pathologist. I'm really not able to understand this We have also received information from the patient's dermatologist in [place] notes from April 2016. This was a doctor Agarwal- antal. 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 He was prescribed topical steroid high potency under occlusion which helped but at this point the patient did not have a deep punched out wound. 12/18/16; the patient's wound is larger in terms of surface area however this surface looks better and there is less depth. The surrounding erythema also is better. The patient states that the wrap we put on came off 2 days ago when he has been using his compression stockings. He we are in the process of getting a dermatology consult. 12/26/16 on evaluation today patient's left lower extremity wound shows evidence of infection with surrounding erythema noted. He has been tolerating the dressing changes but states that he has noted more discomfort. There is a larger area of erythema surrounding the wound. No fevers, chills, nausea, or vomiting noted at this time. With that being said the wound still does have slough covering the surface. He is not allergic to any medication that he is aware of at this  point. In regard to his right lower extremity he had several regions that are erythematous and pruritic he wonders if there's anything we can do to help that. 01/02/17 I reviewed patient's wound culture which was obtained his visit last week. He was placed on doxycycline at that point. Unfortunately that does not appear to be an antibiotic that would  likely help with the situation however the pseudomonas noted on culture is sensitive to Cipro. Also unfortunately patient's wound seems to have a large compared to last week's evaluation. Not severely so but there are definitely increased measurements in general. He is continuing to have discomfort as well he writes this to be a seven out of 10. In fact he would prefer me not to perform any debridement today due to the fact that he is having discomfort and considering he has an active infection on the little reluctant to do so anyway. No fevers, chills, nausea, or vomiting noted at this time. 01/08/17; patient seems dermatology on September 5. I suspect dermatology will want the slides from the biopsy I did sent to their pathologist. I'm not sure if there is a way we can expedite that. In any case the culture I did before I left on vacation 3 [name] ([id number]) weeks ago showed Pseudomonas he was given 10 days of Cipro and per her description of her intake nurses is actually somewhat better this week although the wound is quite a bit bigger than I remember the last time I saw this. He still has 3 more days of Cipro 01/21/17; dermatology appointment tomorrow. He has completed the ciprofloxacin for Pseudomonas. Surface of the wound looks better however he is had some deterioration in the lesions on his right leg. Meantime the left lateral leg wound we will continue with sample 01/29/17; patient had his dermatology appointment but I can't yet see that note. He is completed his antibiotics. The wound is more superficial but considerably larger in circumferential area than when he came in. This is in his left lateral calf. He also has swollen erythematous areas with superficial wounds on the right leg and small papular areas on both arms. There apparently areas in her his upper thighs and buttocks I did not look at those. Dermatology biopsied the right leg. Hopefully will have their input next  week. 02/05/17; patient went back to see his dermatologist who told him that he had a "scratching problem" as well as staph. He is now on a 30 day course of doxycycline and I believe she gave him triamcinolone cream to the right leg areas to help with the itching [not exactly sure but probably triamcinolone]. She apparently looked at the left lateral leg wound although this was not rebiopsied and I think felt to be ultimately part of the same pathogenesis. He is using sample border foam and changing nevus himself. He now has a new open area on the right posterior leg which was his biopsy site I don't have any of the dermatology notes 02/12/17; we put the patient in compression last week with SANTYL to the wound on the left leg and the biopsy. Edema is much better and the depth of the wound is now at level of skin. Area is still the same oBiopsy site on the right lateral leg we've also been using santyl with a border foam dressing and he is changing this himself. 02/19/17; Using silver alginate started last week to both the substantial left leg wound and the biopsy site on the right wound.  He is tolerating compression well. Has a an appointment with his primary M.D. tomorrow wondering about diuretics although I'm wondering if the edema problem is actually lymphedema 02/26/17; the patient has been to see his primary doctor Dr. Jerelyn Charles at [place] our primary care. She started him on Lasix 20 mg and this seems to have helped with the edema. However we are not making substantial change with the left lateral calf wound and inflammation. The biopsy site on the right leg also looks stable but not really all that different. 03/12/17; the patient has been to see vein and vascular Dr. Wyn Quaker. He has had venous reflux studies I have not reviewed these. I did get a call from his dermatology office. They felt that he might have pathergy based on their biopsy on his right leg which led them to look at the slides  of the biopsy I did on the left leg and they wonder whether this represents pyoderma gangrenosum which was the original supposition in a man with ulcerative colitis albeit inactive for many years. They therefore recommended clobetasol and tetracycline i.e. 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 03/26/17; apparently the patient just had reflux studies not an appointment with Dr. dew. She arrives in clinic today having applied clobetasol for 2-3 weeks. He notes over the last 2-3 days excessive drainage having to change the dressing 3-4 times a day and also expanding erythema. 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 Carles Collet at [facility] dermatology locally who kindly saw him at my request. 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 My concern was the degree of erythema medially extending from his left leg wound which was either inflammation from pyoderma or cellulitis. I put him on Augmentin however culture of the wound showed Pseudomonas which is quinolone sensitive. I really don't believe he has cellulitis however in view of everything I will continue and give him a course of Cipro. He is also on doxycycline as an immune modulator for the pyoderma. In addition to his original wound on the left lateral leg with surrounding erythema he has a wound on the right posterior calf which was an original biopsy site done by dermatology. 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 Saw Dr. Carles Collet yesterday. He has been using topical antibiotics to both wound areas his original wound on the left and the biopsies/pathergy area on  the right. There is definitely some improvement in the inflammation around the wound on the right although the patient states he has increasing sensitivity of the wounds. He is on prednisone 60 and doxycycline 1 as prescribed by Dr. Carles Collet. He is covering the topical antibiotic with gauze and putting this in his own compression stocks and changing this daily. He states that Dr. Aquilla Hacker did a culture of the left leg wound yesterday 05/07/17; pyoderma gangrenosum. The patient saw Dr. Carles Collet yesterday and has a follow-up with her in one month. He is still using topical antibiotics to both wounds although he can't recall exactly what type. He is still on prednisone 60 mg. Dr. Carles Collet stated that the doxycycline could stop if we were in agreement. 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 The right medial leg was induced by biopsy/pathergy. The area on the right is essentially healed. 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 The right medial leg has closed and remains closed. He [name], Mohd. ([id number]) is still on prednisone 60. oHe tells me he missed his last dermatology appointment with Dr. Carles Collet but will make another appointment. He reports that her blood sugar at a recent screen in [place] was high 200's. He was 180 today. 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 I think he is going to require still much longer prednisone perhaps another 3 months before attempting to taper. In the meantime his wound is a lot better. Smaller. He is cleaning this off daily and applying topical antibiotics. When he was last in the clinic I thought about changing to [facility] and actually put in a couple of calls to dermatology although probably not during their business hours. In any case the  wound looks better smaller I don't think there is any need to change what he is doing Psychologist, prison and probation services) Signed: 07/09/2017 5:05:40 PM By: Baltazar Najjar MD Entered By: Baltazar Najjar on 07/09/2017 08:39:16 [name] ([id number]) -------------------------------------------------------------------------------- Physical Exam Details Patient Name: [name] Date of Service: 07/09/2017 8:00 AM Medical Record Number: [id number] Patient Account Number: [id number] Date of Birth/Sex: 05/25/1980 (37 y.o. Male) Treating RN: Huel Coventry Primary Care Provider: [name] Other Clinician: Referring Provider: [name] Treating Provider/Extender: Maxwell Caul Weeks in Treatment: 31 Constitutional Sitting or standing Blood Pressure is within target range for patient.Marland Kitchen Respirations regular, non-labored and within target range.. Temperature is normal and within the target range for the patient.Marland Kitchen appears in no distress. More cushingoid in appearance. Eyes Conjunctivae clear. No discharge. Respiratory Respiratory effort is easy and symmetric bilaterally. Rate is normal at rest and on room air.. Cardiovascular Pedal pulses palpable and strong bilaterally.. Gastrointestinal (GI) Patient has ulcerative colitis and reports 3 stools a day. No blood but some mucus. Integumentary (Hair, Skin) No other skin issues on his lower extremities. Psychiatric No evidence of depression, anxiety, or agitation. Calm, cooperative, and communicative. Appropriate interactions and affect.. Notes Wound exam; the left lateral leg wound is smaller. There is still surface slough however this largely white and with improvement in wound dimensions it is difficult to suggestive more complex dressing. It is no evidence of surrounding infection Electronic Signature(s) Signed: 07/09/2017 5:05:40 PM By: Baltazar Najjar MD Entered By: Baltazar Najjar on 07/09/2017 08:41:22 [name]  ([id number]) -------------------------------------------------------------------------------- Physician Orders Details Patient Name: [name] Date of Service: 07/09/2017 8:00 AM Medical Record Number: [id number] Patient Account Number: [id number] Date of Birth/Sex: 1980-08-28 (37 y.o. Male) Treating RN: Huel Coventry Primary Care Provider: [name] Other Clinician: Referring Provider: [name] Treating Provider/Extender: Altamese [place] in Treatment: 31 Verbal / Phone Orders: No Diagnosis Coding Blood Glucose Testing Wound #1 Left,Lateral Lower Leg o Finger stick in clinic as a component of the assessment of chronic ulcer - Results 180 Wound Cleansing Wound #1 Left,Lateral Lower Leg o Clean wound with Normal Saline. Anesthetic (add to Medication List) Wound #1 Left,Lateral Lower Leg o Topical Lidocaine 4% cream applied to wound bed prior to debridement (In Clinic Only). Primary Wound Dressing Wound #1 Left,Lateral Lower Leg o Other: - triple antibiotic cream to open wound area Secondary Dressing Wound #1 Left,Lateral Lower Leg o Boardered  Foam Dressing Dressing Change Frequency Wound #1 Left,Lateral Lower Leg o Change dressing every day. Follow-up Appointments o Return Appointment in 1 month Edema Control Wound #1 Left,Lateral Lower Leg o Patient to wear own compression stockings Electronic Signature(s) Signed: 07/09/2017 5:05:40 PM By: Baltazar Najjar MD Signed: 07/09/2017 5:24:42 PM By: Elliot Gurney, [name], RN, [name], [name], [name] Entered By: Elliot Gurney, [name], RN, [name], [name] on 07/09/2017 16:10:96 [name], [name] ([id number]) -------------------------------------------------------------------------------- Problem List Details Patient Name: [name] Date of Service: 07/09/2017 8:00 AM Medical Record Number: [id number] Patient Account Number: [id number] Date of Birth/Sex: July 23, 1980 (37 y.o. 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 12/04/2016 Yes L88 Pyoderma gangrenosum 03/26/2017 Yes I87.2 Venous insufficiency (chronic) (peripheral) 12/04/2016 Yes Inactive Problems ICD-10 Code Description Active Date Inactive Date L97.213 Non-pressure chronic ulcer of right calf with necrosis of muscle 04/02/2017 04/02/2017 Resolved Problems Electronic Signature(s) Signed: 07/09/2017 5:05:40 PM By: Baltazar Najjar MD Entered By: Baltazar Najjar on 07/09/2017 08:36:05 [name] ([id number]) -------------------------------------------------------------------------------- Progress Note Details Patient Name: [name] Date of Service: 07/09/2017 8:00 AM Medical Record Number: [id number] Patient Account Number: [id number] Date of Birth/Sex: 12-Feb-1981 (37 y.o. Male) Treating RN: Huel Coventry Primary Care Provider: [name] Other Clinician: Referring Provider: [name] Treating Provider/Extender: Maxwell Caul Weeks in Treatment: 31 Subjective History of Present Illness (HPI) 12/04/16; 37 year old man who comes into the clinic today for review of a wound on the posterior left calf. He tells me that is been there for about a year. He is not a diabetic he does smoke half a pack per day. He was seen in the ER on 11/20/16 felt to have cellulitis around the wound and was given clindamycin. An x-ray did not show osteomyelitis. The patient initially tells me that he has a milk allergy that sets off a pruritic itching rash on his lower legs which she scratches incessantly and he thinks that's what may have set up the wound. He has been using various topical antibiotics and ointments without any effect. He works in a [employer]ing [employer] and is on his feet  all day. He does not have a prior history of wounds however he does have the rash on both lower legs the right arm and the ventral aspect of his left arm. These are excoriations and clearly have had scratching however there are of macular looking areas on both legs including a substantial larger area on the right leg. This does not have an underlying open area. There is no blistering. 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 He seemed to recognize this. 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 He is not in any treatment for this. He's had no recent travel 12/11/16; the patient arrives today with his wound and roughly the same condition we've been using silver alginate this is a deep punched out wound with some surrounding erythema but no tenderness. Biopsy I did did not show confirmed pyoderma gangrenosum suggested nonspecific inflammation and vasculitis but does not provide an actual description of what was seen by the pathologist. I'm really  not able to understand this We have also received information from the patient's dermat[place] in [place] from [place] 2016. This was a doctor Agarwal- antal. 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 He was prescribed topical steroid high potency under occlusion which helped but at this point the patient did not have a deep punched out wound. 12/18/16; the patient's wound is larger in terms of surface area however this surface looks better and there is less depth. The surrounding erythema also is better. The patient states that the wrap we put on came off 2 days ago when he has been using his compression stockings. He we are in the process of getting a dermatology consult. 12/26/16 on evaluation today patient's left lower  extremity wound shows evidence of infection with surrounding erythema noted. He has been tolerating the dressing changes but states that he has noted more discomfort. There is a larger area of erythema surrounding the wound. No fevers, chills, nausea, or vomiting noted at this time. With that being said the wound still does have slough covering the surface. He is not allergic to any medication that he is aware of at this point. In regard to his right lower extremity he had several regions that are erythematous and pruritic he wonders if there's anything we can do to help that. 01/02/17 I reviewed patient's wound culture which was obtained his visit last week. He was placed on doxycycline at that point. Unfortunately that does not appear to be an antibiotic that would likely help with the situation however the pseudomonas noted on culture is sensitive to Cipro. Also unfortunately patient's wound seems to have a large compared to last week's evaluation. Not severely so but there are definitely increased measurements in general. He is continuing to have discomfort as well he writes this to be a seven out of 10. In fact he would prefer me not to perform any debridement today due to the fact that he is having discomfort and considering he has an active infection on the little reluctant to do so anyway. No fevers, chills, nausea, or vomiting noted at this time. 01/08/17; patient seems dermatology on September 5. I suspect dermatology will want the slides from the biopsy I did sent to their pathologist. I'm not sure if there is a way we can expedite that. In any case the culture I did before I left on vacation 3 weeks ago showed Pseudomonas he was given 10 days of Cipro and per her description of her intake nurses is actually somewhat better this week although the wound is quite a bit bigger than I remember the last time I saw this. He still has 3 [name], [name] ([id number]) more days of Cipro 01/21/17;  dermatology appointment tomorrow. He has completed the ciprofloxacin for Pseudomonas. Surface of the wound looks better however he is had some deterioration in the lesions on his right leg. Meantime the left lateral leg wound we will continue with sample 01/29/17; patient had his dermatology appointment but I can't yet see that note. He is completed his antibiotics. The wound is more superficial but considerably larger in circumferential area than when he came in. This is in his left lateral calf. He also has swollen erythematous areas with superficial wounds on the right leg and small papular areas on both arms. There apparently areas in her his upper thighs and buttocks I did not look at those. Dermatology biopsied the right leg. Hopefully will have their input next week. 02/05/17; patient went back  to see his dermatologist who told him that he had a "scratching problem" as well as staph. He is now on a 30 day course of doxycycline and I believe she gave him triamcinolone cream to the right leg areas to help with the itching [not exactly sure but probably triamcinolone]. She apparently looked at the left lateral leg wound although this was not rebiopsied and I think felt to be ultimately part of the same pathogenesis. He is using sample border foam and changing nevus himself. He now has a new open area on the right posterior leg which was his biopsy site I don't have any of the dermatology notes 02/12/17; we put the patient in compression last week with SANTYL to the wound on the left leg and the biopsy. Edema is much better and the depth of the wound is now at level of skin. Area is still the same Biopsy site on the right lateral leg we've also been using santyl with a border foam dressing and he is changing this himself. 02/19/17; Using silver alginate started last week to both the substantial left leg wound and the biopsy site on the right wound. He is tolerating compression well. Has a an  appointment with his primary M.D. tomorrow wondering about diuretics although I'm wondering if the edema problem is actually lymphedema 02/26/17; the patient has been to see his primary doctor Dr. Jerelyn Charles at [place] our primary care. She started him on Lasix 20 mg and this seems to have helped with the edema. However we are not making substantial change with the left lateral calf wound and inflammation. The biopsy site on the right leg also looks stable but not really all that different. 03/12/17; the patient has been to see vein and vascular Dr. Wyn Quaker. He has had venous reflux studies I have not reviewed these. I did get a call from his dermatology office. 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 They therefore recommended clobetasol and tetracycline i.e. 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 03/26/17; apparently the patient just had reflux studies not an appointment with Dr. dew. She arrives in clinic today having applied clobetasol for 2-3 weeks. He notes over the last 2-3 days excessive drainage having to change the dressing 3-4 times a day and also expanding erythema. 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 Carles Collet at [facility] dermatology locally who kindly saw him at my request. 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 My concern was the degree of erythema medially extending from his left  leg wound which was either inflammation from pyoderma or cellulitis. I put him on Augmentin however culture of the wound showed Pseudomonas which is quinolone sensitive. I really don't believe he has cellulitis however in view of everything I will continue and give him a course of Cipro. He is also on doxycycline as an immune modulator for the pyoderma. In addition to his original wound on the  left lateral leg with surrounding erythema he has a wound on the right posterior calf which was an original biopsy site done by dermatology. 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 Saw Dr. Carles Collet yesterday. He has been using topical antibiotics to both wound areas his original wound on the left and the biopsies/pathergy area on the right. There is definitely some improvement in the inflammation around the wound on the right although the patient states he has increasing sensitivity of the wounds. He is on prednisone 60 and doxycycline 1 as prescribed by Dr. Carles Collet. He is covering the topical antibiotic with gauze and putting this in his own compression stocks and changing this daily. He states that Dr. Aquilla Hacker did a culture of the left leg wound yesterday 05/07/17; pyoderma gangrenosum. The patient saw Dr. Carles Collet yesterday and has a follow-up with her in one month. He is still using topical antibiotics to both wounds although he can't recall exactly what type. He is still on prednisone 60 mg. Dr. Carles Collet stated that the doxycycline could stop if we were in agreement. 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 The right medial leg was induced by biopsy/pathergy. The area on the right is essentially healed. Still on high-dose prednisone using topical antibiotics to the wound 07/09/17; pyoderma gangrenosum with wounds on the left lateral  leg. The right medial leg has closed and remains closed. He is still on prednisone 60. He tells me he missed his last dermatology appointment with Dr. Carles Collet but will make another appointment. He reports [name], [name] ([id number]) that her blood sugar at a recent screen in [place] was high 200's. He was 180 today. 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 I think he is going to require still much longer prednisone perhaps another 3 months before attempting to taper. In the meantime his wound is a lot better. Smaller. He is cleaning this off daily and applying topical antibiotics. When he was last in the clinic I thought about changing to [facility] and actually put in a couple of calls to dermatology although probably not during their business hours. In any case the wound looks better smaller I don't think there is any need to change what he is doing Objective Constitutional Sitting or standing Blood Pressure is within target range for patient.Marland Kitchen Respirations regular, non-labored and within target range.. Temperature is normal and within the target range for the patient.Marland Kitchen appears in no distress. More cushingoid in appearance. Vitals Time Taken: 8:05 AM, Height: 71 in, Weight: 338 lbs, BMI: 47.1, Temperature: 98.2 F, Pulse: 87 bpm, Respiratory Rate: 18 breaths/min, Blood Pressure: 155/92 mmHg, Capillary Blood Glucose: 180 mg/dl. General Notes: Patient asked for blood sugar to be checked. Eyes Conjunctivae clear. No discharge. Respiratory Respiratory effort is easy and symmetric bilaterally. Rate is normal at rest and on room air.. Cardiovascular Pedal pulses palpable and strong bilaterally.. Gastrointestinal (GI) Patient has ulcerative colitis and reports 3 stools a day. No blood but some mucus. Psychiatric No evidence of depression, anxiety, or agitation. Calm, cooperative, and communicative. Appropriate interactions and affect.. General Notes: Wound exam; the left lateral leg  wound is smaller. There is still surface slough however this largely white and with improvement in wound dimensions it is difficult to suggestive more complex dressing. It is no evidence of surrounding infection Integumentary (Hair, Skin) No other skin issues on his lower extremities.  Wound #1 status is Open. Original cause of wound was Gradually Appeared. The wound is located on the Left,Lateral Lower Leg. The wound measures 4.4cm length x 3.5cm width x 0.3cm depth; 12.095cm^2 area and 3.629cm^3 volume. There is Fat Layer (Subcutaneous Tissue) Exposed exposed. There is no tunneling or undermining noted. There is a large amount of serosanguineous drainage noted. The wound margin is distinct with the outline attached to the wound base. There is medium (34-66%) red granulation within the wound bed. There is a medium (34-66%) amount of necrotic tissue within the wound bed including Adherent Slough. The periwound skin appearance exhibited: Induration, Erythema. The periwound skin appearance did not exhibit: Callus, Crepitus, Excoriation, Rash, Scarring, Dry/Scaly, Maceration, Atrophie Blanche, Cyanosis, Ecchymosis, Hemosiderin Staining, Mottled, Pallor, Rubor. The surrounding wound skin color is noted with erythema which is circumferential. Periwound temperature was noted as No Abnormality. The periwound has tenderness on palpation. [name], [name] ([id number]) Wound #2 status is Healed - Epithelialized. Original cause of wound was Surgical Injury. The wound is located on the Right,Posterior Lower Leg. The wound measures 0cm length x 0cm width x 0cm depth; 0cm^2 area and 0cm^3 volume. There is Fat Layer (Subcutaneous Tissue) Exposed exposed. There is no tunneling or undermining noted. There is a none present amount of drainage noted. The wound margin is distinct with the outline attached to the wound base. There is no granulation within the wound bed. There is no necrotic tissue within the wound bed. The  periwound skin appearance exhibited: Scarring. The periwound skin appearance did not exhibit: Callus, Crepitus, Excoriation, Induration, Rash, Dry/Scaly, Maceration, Atrophie Blanche, Cyanosis, Ecchymosis, Hemosiderin Staining, Mottled, Pallor, Rubor, Erythema. Periwound temperature was noted as No Abnormality. 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 Anesthetic (add to Medication List): Wound #1 Left,Lateral Lower Leg: Topical Lidocaine 4% cream applied to wound bed prior to debridement (In Clinic Only). Primary Wound Dressing: Wound #1 Left,Lateral Lower Leg: Other: - triple antibiotic cream to open wound area Secondary Dressing: Wound #1 Left,Lateral Lower Leg: Boardered Foam Dressing Dressing Change Frequency: Wound #1 Left,Lateral Lower Leg: Change dressing every day. Follow-up Appointments: Return Appointment in 1 month Edema Control: Wound #1 Left,Lateral Lower Leg: Patient to wear own compression stockings [name], [name] ([id number]) #1 pyoderma gangrenosum; the patient is on high-dose steroids the wound is really responding nicely. Simple dressing with triple antibiotic. Given the improvement I don't think anything in addition is necessary #2 his right leg biopsy site/pathergy has healed #3 I've advised him to see his primary doctor about his hyperglycemia he probably has another few months of high-dose prednisone at 60 mg and then a taper afterwards. 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 He hasn't had a  colonoscopy in 5 years, he probably needs to see GI Electronic Signature(s) Signed: 07/09/2017 5:05:40 PM By: Baltazar Najjar MD Entered By: Baltazar Najjar on 07/09/2017 08:44:25 [name] ([id number]) -------------------------------------------------------------------------------- SuperBill Details Patient Name: [name] Date of Service: 07/09/2017 Medical Record Number: [id number] Patient Account Number: [id number] Date of Birth/Sex: 07/21/1980 (37 y.o. Male) Treating RN: Huel Coventry Primary Care Provider: [name] Other  Clinician: Referring Provider: [name] Treating Provider/Extender: Altamese  in Treatment: 31 Diagnosis Coding ICD-10 Codes Code Description [phone] 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sis of muscle L88 Pyoderma gangrenosum I87.2 Venous insufficiency (chronic) (peripheral) Facility Procedures CPT4 Code: [id number] Description: 99213 - WOUND CARE VISIT-LEV 3 EST PT Modifier: Quantity: 1 Physician Procedures CPT4 Code: 1914782 Description: 99213 - WC PHYS LEVEL 3 - EST PT ICD-10 Diagnosis Description L97.223 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sis of m L88 Pyoderma gangrenosum Modifier: uscle Quantity: 1 Electronic Signature(s) Signed: 07/09/2017 5:05:40 PM By: Baltazar Najjar MD Entered By: Baltazar Najjar on 07/09/2017 08:44:47

## 2017-07-12 NOTE — Progress Notes (Signed)
ALFIO, LOESCHER ([id number]) Visit Report for 07/09/2017 Arrival Information Details Patient Name: [name], [name] Date of Service: 07/09/2017 8:00 AM Medical Record Number: [id number] Patient Account Number: [id number] Date of Birth/Sex: 1980/10/10 (37 y.o. Male) Treating RN: Ahmed Prima Primary Care Wilver Tignor: [name] Other Clinician: Referring Benz Vandenberghe: [name] Treating Zalmen Wrightsman/Extender: Tito Dine in Treatment: 21 Visit Information History Since Last Visit All ordered tests and consults were completed: No Patient Arrived: Ambulatory Added or deleted any medications: No Arrival Time: 08:04 Any new allergies or adverse reactions: No Accompanied By: self Had a fall or experienced change in No Transfer Assistance: None activities of daily living that may affect Patient Identification Verified: Yes risk of falls: Secondary Verification Process Completed: Yes Signs or symptoms of abuse/neglect since last visito No Patient Requires Transmission-Based No Hospitalized since last visit: No Precautions: Has Dressing in Place as Prescribed: Yes Patient Has Alerts: No Has Compression in Place as Prescribed: Yes Pain Present Now: No Electronic Signature(s) Signed: 07/11/2017 8:00:35 AM By: Alric Quan Entered By: Alric Quan on 07/09/2017 08:05:33 Weible, [name] ([id number]) -------------------------------------------------------------------------------- Clinic Level of Care Assessment Details Patient Name: [name] Date of Service: 07/09/2017 8:00 AM Medical Record Number: [id number] Patient Account Number: [id number] Date of Birth/Sex: [birth year] (37 y.o. Male) Treating RN: [name] Primary Care Yer Olivencia: [name] Other Clinician: Referring Ludmila Ebarb: [name] Treating Joal Eakle/Extender: Tito Dine in Treatment: 31 Clinic Level of Care Assessment Items TOOL 4 Quantity Score []  - Use when only an EandM is  performed on FOLLOW-UP visit 0 ASSESSMENTS - Nursing Assessment / Reassessment []  - Reassessment of Co-morbidities (includes updates in patient status) 0 X- 1 5 Reassessment of Adherence to Treatment Plan ASSESSMENTS - Wound and Skin Assessment / Reassessment X - Simple Wound Assessment / Reassessment - one wound 1 5 []  - 0 Complex Wound Assessment / Reassessment - multiple wounds []  - 0 Dermatologic / Skin Assessment (not related to wound area) ASSESSMENTS - Focused Assessment []  - Circumferential Edema Measurements - multi extremities 0 []  - 0 Nutritional Assessment / Counseling / Intervention []  - 0 Lower Extremity Assessment (monofilament, tuning fork, pulses) []  - 0 Peripheral Arterial Disease Assessment (using hand held doppler) ASSESSMENTS - Ostomy and/or Continence Assessment and Care []  - Incontinence Assessment and Management 0 []  - 0 Ostomy Care Assessment and Management (repouching, etc.) PROCESS - Coordination of Care X - Simple Patient / Family Education for ongoing care 1 15 []  - 0 Complex (extensive) Patient / Family Education for ongoing care X- 1 10 Staff obtains Programmer, systems, Records, Test Results / Process Orders []  - 0 Staff telephones HHA, Nursing Homes / Clarify orders / etc []  - 0 Routine Transfer to another Facility (non-emergent condition) []  - 0 Routine Hospital Admission (non-emergent condition) []  - 0 New Admissions / Biomedical engineer / Ordering NPWT, Apligraf, etc. []  - 0 Emergency Hospital Admission (emergent condition) X- 1 10 Simple Discharge Coordination Gunderman, Prescott ([id number]) []  - 0 Complex (extensive) Discharge Coordination PROCESS - Special Needs []  - Pediatric / Minor Patient Management 0 []  - 0 Isolation Patient Management []  - 0 Hearing / Language / Visual special needs []  - 0 Assessment of Community assistance (transportation, D/C planning, etc.) []  - 0 Additional assistance / Altered mentation []  - 0 Support  Surface(s) Assessment (bed, cushion, seat, etc.) INTERVENTIONS - Wound Cleansing / Measurement X - Simple Wound Cleansing - one wound 1 5 []  - 0 Complex Wound Cleansing - multiple wounds X- 1 5 Wound Imaging (photographs -  any number of wounds) []  - 0 Wound Tracing (instead of photographs) X- 1 5 Simple Wound Measurement - one wound []  - 0 Complex Wound Measurement - multiple wounds INTERVENTIONS - Wound Dressings []  - Small Wound Dressing one or multiple wounds 0 X- 1 15 Medium Wound Dressing one or multiple wounds []  - 0 Large Wound Dressing one or multiple wounds []  - 0 Application of Medications - topical []  - 0 Application of Medications - injection INTERVENTIONS - Miscellaneous []  - External ear exam 0 []  - 0 Specimen Collection (cultures, biopsies, blood, body fluids, etc.) []  - 0 Specimen(s) / Culture(s) sent or taken to Lab for analysis []  - 0 Patient Transfer (multiple staff / Civil Service fast streamer / Similar devices) []  - 0 Simple Staple / Suture removal (25 or less) []  - 0 Complex Staple / Suture removal (26 or more) []  - 0 Hypo / Hyperglycemic Management (close monitor of Blood Glucose) []  - 0 Ankle / Brachial Index (ABI) - do not check if billed separately X- 1 5 Vital Signs Estrin, Lavonne ([id number]) Has the patient been seen at the hospital within the last three years: Yes Total Score: 80 Level Of Care: New/Established - Level 3 Electronic Signature(s) Signed: 07/09/2017 5:24:42 PM By: [name], [name], RN, [name], [name], [name] Entered By: [name], [name], RN, [name], [name] on 07/09/2017 08:29:54 [name] ([id number]) -------------------------------------------------------------------------------- Encounter Discharge Information Details Patient Name: [name] Date of Service: 07/09/2017 8:00 AM Medical Record Number: [id number] Patient Account Number: [id number] Date of Birth/Sex: 1980/07/12 (37 y.o. Male) Treating RN: Montey Hora Primary Care Wladyslawa Disbro: [name]  Other Clinician: Referring Lovena Kluck: [name] Treating Josephine Wooldridge/Extender: Tito Dine in Treatment: 86 Encounter Discharge Information Items Discharge Pain Level: 0 Discharge Condition: Stable Ambulatory Status: Ambulatory Discharge Destination: Home Transportation: Private Auto Accompanied By: self Schedule Follow-up Appointment: Yes Medication Reconciliation completed and No provided to Patient/Care Sigmond Patalano: Patient Clinical Summary of Care: Declined Electronic Signature(s) Signed: 07/11/2017 8:16:02 AM By: Ruthine Dose Entered By: Ruthine Dose on 07/09/2017 08:42:16 Diver, [name] ([id number]) -------------------------------------------------------------------------------- Lower Extremity Assessment Details Patient Name: [name] Date of Service: 07/09/2017 8:00 AM Medical Record Number: [id number] Patient Account Number: [id number] Date of Birth/Sex: 08/28/1980 (37 y.o. Male) Treating RN: Ahmed Prima Primary Care Zi Newbury: [name] Other Clinician: Referring Trenia Tennyson: [name] Treating Win Guajardo/Extender: Ricard Dillon Weeks in Treatment: 31 Edema Assessment Assessed: [Left: No] [Right: No] [Left: Edema] [Right: :] Calf Left: Right: Point of Measurement: 33 cm From Medial Instep 52 cm cm Ankle Left: Right: Point of Measurement: 11 cm From Medial Instep 30.5 cm cm Vascular Assessment Pulses: Dorsalis Pedis Palpable: [Left:Yes] Posterior Tibial Extremity colors, hair growth, and conditions: Extremity Color: [Left:Hyperpigmented] Hair Growth on Extremity: [Left:Yes] Temperature of Extremity: [Left:Warm] Capillary Refill: [Left:< 3 seconds] Toe Nail Assessment Left: Right: Thick: No Discolored: No Deformed: No Improper Length and Hygiene: No Electronic Signature(s) Signed: 07/11/2017 8:00:35 AM By: Alric Quan Entered By: Alric Quan on 07/09/2017 08:19:10 Ly, [name]  ([id number]) -------------------------------------------------------------------------------- Multi Wound Chart Details Patient Name: [name] Date of Service: 07/09/2017 8:00 AM Medical Record Number: [id number] Patient Account Number: [id number] Date of Birth/Sex: [birth year] (36 y.o. Male) Treating RN: [name] Primary Care Delon Revelo: [name] Other Clinician: Referring Juel Bellerose: [name] Treating Sim Choquette/Extender: Ricard Dillon Weeks in Treatment: 31 Vital Signs Height(in): 71 Capillary Blood Glucose 180 (mg/dl): Weight(lbs): 338 Pulse(bpm): 87 Body Mass Index(BMI): 47 Blood Pressure(mmHg): 155/92 Temperature(F): 98.2 Respiratory Rate 18 (breaths/min): Photos: [1:No Photos] [2:No Photos] [N/A:N/A] Wound Location: [1:Left  Lower Leg - Lateral] [2:Right Lower Leg - Posterior] [N/A:N/A] Wounding Event: [1:Gradually Appeared] [2:Surgical Injury] [N/A:N/A] 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] [2:Open Surgical Wound] [N/A:N/A] Comorbid History: [1:Sleep Apnea, Hypertension, Colitis] [2:Sleep Apnea, Hypertension, Colitis] [N/A:N/A] Date Acquired: [1:11/18/2015] [2:02/02/2017] [N/A:N/A] Weeks of Treatment: [1:31] [2:22] [N/A:N/A] Wound Status: [1:Open] [2:Healed - Epithelialized] [N/A:N/A] Measurements L x W x D [1:4.4x3.5x0.3] [2:0x0x0] [N/A:N/A] (cm) Area (cm) : [1:12.095] [2:0] [N/A:N/A] Volume (cm) : [1:3.629] [2:0] [N/A:N/A] % Reduction in Area: [1:-146.40%] [2:100.00%] [N/A:N/A] % Reduction in Volume: [1:7.60%] [2:100.00%] [N/A:N/A] Classification: [1:Full Thickness Without Exposed Support Structures] [2:Partial Thickness] [N/A:N/A] Exudate Amount: [1:Large] [2:None Present] [N/A:N/A] Exudate Type: [1:Serosanguineous] [2:N/A] [N/A:N/A] Exudate Color: [1:red, brown] [2:N/A] [N/A:N/A] Wound Margin: [1:Distinct, outline attached] [2:Distinct, outline attached] [N/A:N/A] Granulation Amount: [1:Medium (34-66%)] [2:None Present (0%)] [N/A:N/A] Granulation  Quality: [1:Red] [2:N/A] [N/A:N/A] Necrotic Amount: [1:Medium (34-66%)] [2:None Present (0%)] [N/A:N/A] Exposed Structures: [1:Fat Layer (Subcutaneous Tissue) Exposed: Yes Fascia: No Tendon: No Muscle: No Joint: No Bone: No] [2:Fat Layer (Subcutaneous Tissue) Exposed: Yes Fascia: No Tendon: No Muscle: No Joint: No Bone: No] [N/A:N/A] Epithelialization: [1:None] [2:Large (67-100%)] [N/A:N/A] Periwound Skin Texture: [1:Induration: Yes Excoriation: No Callus: No Crepitus: No] [2:Scarring: Yes Excoriation: No Induration: No Callus: No] [N/A:N/A] Rash: No Crepitus: No Scarring: No Rash: No Periwound Skin Moisture: Maceration: No Maceration: No N/A Dry/Scaly: No Dry/Scaly: No Periwound Skin Color: Erythema: Yes Atrophie Blanche: No N/A Atrophie Blanche: No Cyanosis: No Cyanosis: No Ecchymosis: No Ecchymosis: No Erythema: No Hemosiderin Staining: No Hemosiderin Staining: No Mottled: No Mottled: No Pallor: No Pallor: No Rubor: No Rubor: No Erythema Location: Circumferential N/A N/A Erythema Change: Increased N/A N/A Temperature: No Abnormality No Abnormality N/A Tenderness on Palpation: Yes No N/A Wound Preparation: Ulcer Cleansing: Ulcer Cleansing: N/A Rinsed/Irrigated with Saline Rinsed/Irrigated with Saline Topical Anesthetic Applied: Topical Anesthetic Applied: None, Other: Lidocaine 4% None Treatment Notes Electronic Signature(s) Signed: 07/09/2017 5:05:40 PM By: Linton Ham MD Entered By: Linton Ham on 07/09/2017 08:36:24 [name] ([id number]) -------------------------------------------------------------------------------- Multi-Disciplinary Care Plan Details Patient Name: [name] Date of Service: 07/09/2017 8:00 AM Medical Record Number: [id number] Patient Account Number: [id number] Date of Birth/Sex: Jan 08, 1981 (37 y.o. Male) Treating RN: [name] Primary Care Tysen Roesler: [name] Other Clinician: Referring Sargent Mankey: [name] Treating Turner Kunzman/Extender: Ricard Dillon Weeks in Treatment: 31 Active Inactive ` Orientation to the Wound Care Program Nursing Diagnoses: Knowledge deficit related to the wound healing center program Goals: Patient/caregiver will verbalize understanding of the [facility] Date Initiated: 12/11/2016 Target Resolution Date: 03/13/2017 Goal Status: Active Interventions: Provide education on orientation to the wound center Notes: ` Venous Leg Ulcer Nursing Diagnoses: Knowledge deficit related to disease process and management Potential for venous Insuffiency (use before diagnosis confirmed) Goals: Non-invasive venous studies are completed as ordered Date Initiated: 12/11/2016 Target Resolution Date: 03/13/2017 Goal Status: Active Patient will maintain optimal edema control Date Initiated: 12/11/2016 Target Resolution Date: 03/13/2017 Goal Status: Active Patient/caregiver will verbalize understanding of disease process and disease management Date Initiated: 12/11/2016 Target Resolution Date: 03/13/2017 Goal Status: Active Verify adequate tissue perfusion prior to therapeutic compression application Date Initiated: 12/11/2016 Target Resolution Date: 03/13/2017 Goal Status: Active Interventions: Assess peripheral edema status every visit. Compression as ordered Provide education on venous insufficiency Treatment Activities: KENYAN, KARNES ([id number]) Therapeutic compression applied : 12/11/2016 Notes: ` Wound/Skin Impairment Nursing Diagnoses: Impaired tissue integrity Knowledge deficit related to ulceration/compromised skin integrity Goals: Patient/caregiver will verbalize understanding of skin care regimen Date Initiated: 12/11/2016 Target Resolution Date: 03/13/2017 Goal Status: Active Ulcer/skin breakdown will have  a volume reduction of 30% by week 4 Date Initiated: 12/11/2016 Target Resolution Date: 03/13/2017 Goal Status:  Active Ulcer/skin breakdown will have a volume reduction of 50% by week 8 Date Initiated: 12/11/2016 Target Resolution Date: 03/13/2017 Goal Status: Active Ulcer/skin breakdown will have a volume reduction of 80% by week 12 Date Initiated: 12/11/2016 Target Resolution Date: 03/13/2017 Goal Status: Active Ulcer/skin breakdown will heal within 14 weeks Date Initiated: 12/11/2016 Target Resolution Date: 03/13/2017 Goal Status: Active Interventions: Assess patient/caregiver ability to obtain necessary supplies Assess patient/caregiver ability to perform ulcer/skin care regimen upon admission and as needed Assess ulceration(s) every visit Provide education on ulcer and skin care Treatment Activities: Skin care regimen initiated : 12/11/2016 Topical wound management initiated : 12/11/2016 Notes: Electronic Signature(s) Signed: 07/09/2017 5:24:42 PM By: [name], [name], RN, [name], [name], [name] Entered By: [name], [name], RN, [name], [name] on 07/09/2017 08:26:27 [name] ([id number]) -------------------------------------------------------------------------------- Pain Assessment Details Patient Name: [name] Date of Service: 07/09/2017 8:00 AM Medical Record Number: [id number] Patient Account Number: [id number] Date of Birth/Sex: Jul 13, 1980 (37 y.o. Male) Treating RN: Ahmed Prima Primary Care Treniyah Lynn: [name] Other Clinician: Referring Philomena Buttermore: [name] Treating Stormee Duda/Extender: Ricard Dillon Weeks in Treatment: 31 Active Problems Location of Pain Severity and Description of Pain Patient Has Paino No Site Locations Pain Management and Medication Current Pain Management: Electronic Signature(s) Signed: 07/11/2017 8:00:35 AM By: Alric Quan Entered By: Alric Quan on 07/09/2017 08:05:44 [name] ([id number]) -------------------------------------------------------------------------------- Patient/Caregiver Education Details Patient Name: [name] Date of Service: 07/09/2017 8:00 AM Medical Record Number: [id number] Patient Account Number: [id number] Date of Birth/Gender: [birth year] (37 y.o. Male) Treating RN: Montey Hora Primary Care Physician: [name] Other Clinician: Referring Physician: [name] Treating Physician/Extender: Tito Dine in Treatment: 31 Education Assessment Education Provided To: Patient Education Topics Provided Wound/Skin Impairment: Handouts: Other: wound care as ordered Methods: Demonstration, Explain/Verbal Responses: State content correctly Electronic Signature(s) Signed: 07/09/2017 4:58:37 PM By: Montey Hora Entered By: Montey Hora on 07/09/2017 08:39:56 Colford, [name] ([id number]) -------------------------------------------------------------------------------- Wound Assessment Details Patient Name: [name] Date of Service: 07/09/2017 8:00 AM Medical Record Number: [id number] Patient Account Number: [id number] Date of Birth/Sex: Jan 20, 1981 (37 y.o. Male) Treating RN: Carolyne Fiscal, Debi Primary Care Elchanan Bob: [name] Other Clinician: Referring Caylin Nass: [name] Treating Aymee Fomby/Extender: Ricard Dillon Weeks in Treatment: 31 Wound Status Wound Number: 1 Primary Etiology: Pyoderma Wound Location: Left Lower Leg - Lateral Wound Status: Open Wounding Event: Gradually Appeared Comorbid History: Sleep Apnea, Hypertension, Colitis Date Acquired: 11/18/2015 Weeks Of Treatment: 31 Clustered Wound: No Photos Photo Uploaded By: Alric Quan on 07/09/2017 09:32:24 Wound Measurements Length: (cm) 4.4 Width: (cm) 3.5 Depth: (cm) 0.3 Area: (cm) 12.095 Volume: (cm) 3.629 % Reduction in Area: -146.4% % Reduction in Volume: 7.6% Epithelialization: None Tunneling: No Undermining: No Wound Description Full Thickness Without Exposed Support Classification: Structures Wound Margin: Distinct, outline  attached Exudate Large Amount: Exudate Type: Serosanguineous Exudate Color: red, brown Foul Odor After Cleansing: No Slough/Fibrino Yes Wound Bed Granulation Amount: Medium (34-66%) Exposed Structure Granulation Quality: Red Fascia Exposed: No Necrotic Amount: Medium (34-66%) Fat Layer (Subcutaneous Tissue) Exposed: Yes Necrotic Quality: Adherent Slough Tendon Exposed: No Muscle Exposed: No Joint Exposed: No Bone Exposed: No Rostron, Zackrey ([id number]) Periwound Skin Texture Texture Color No Abnormalities Noted: No No Abnormalities Noted: No Callus: No Atrophie Blanche: No Crepitus: No Cyanosis: No Excoriation: No Ecchymosis: No Induration: Yes Erythema: Yes Rash: No Erythema Location: Circumferential Scarring: No Erythema Change: Increased Hemosiderin Staining: No Moisture Mottled: No  No Abnormalities Noted: No Pallor: No Dry / Scaly: No Rubor: No Maceration: No Temperature / Pain Temperature: No Abnormality Tenderness on Palpation: Yes Wound Preparation Ulcer Cleansing: Rinsed/Irrigated with Saline Topical Anesthetic Applied: None, Other: Lidocaine 4%, Treatment Notes Wound #1 (Left, Lateral Lower Leg) 1. Cleansed with: Clean wound with Normal Saline 2. Anesthetic Topical Lidocaine 4% cream to wound bed prior to debridement 4. Dressing Applied: Other dressing (specify in notes) 5. Secondary Dressing Applied Bordered Foam Dressing Notes triple antibiotic cream to wound Electronic Signature(s) Signed: 07/11/2017 8:00:35 AM By: Alric Quan Entered By: Alric Quan on 07/09/2017 08:17:29 Ashby, [name] ([id number]) -------------------------------------------------------------------------------- Wound Assessment Details Patient Name: [name] Date of Service: 07/09/2017 8:00 AM Medical Record Number: [id number] Patient Account Number: [id number] Date of Birth/Sex: May 08, 1981 (37 y.o. Male) Treating RN: Ahmed Prima Primary Care Kingsten Enfield:  [name] Other Clinician: Referring [name] [name]: [name] Treating Stoy Fenn/Extender: Ricard Dillon Weeks in Treatment: 31 Wound Status Wound Number: 2 Primary Etiology: Open Surgical Wound Wound Location: Right Lower Leg - Posterior Wound Status: Healed - Epithelialized Wounding Event: Surgical Injury Comorbid History: Sleep Apnea, Hypertension, Colitis Date Acquired: 02/02/2017 Weeks Of Treatment: 22 Clustered Wound: No Photos Photo Uploaded By: Alric Quan on 07/09/2017 09:32:24 Wound Measurements Length: (cm) Width: (cm) Depth: (cm) Area: (cm) Volume: (cm) 0 % Reduction in Area: 100% 0 % Reduction in Volume: 100% 0 Epithelialization: Large (67-100%) 0 Tunneling: No 0 Undermining: No Wound Description Classification: Partial Thickness Wound Margin: Distinct, outline attached Exudate Amount: None Present Foul Odor After Cleansing: No Slough/Fibrino No Wound Bed Granulation Amount: None Present (0%) Exposed Structure Necrotic Amount: None Present (0%) Fascia Exposed: No Fat Layer (Subcutaneous Tissue) Exposed: Yes Tendon Exposed: No Muscle Exposed: No Joint Exposed: No Bone Exposed: No Periwound Skin Texture Texture Color No Abnormalities Noted: No No Abnormalities Noted: No Delap, Brenda ([id number]) Callus: No Atrophie Blanche: No Crepitus: No Cyanosis: No Excoriation: No Ecchymosis: No Induration: No Erythema: No Rash: No Hemosiderin Staining: No Scarring: Yes Mottled: No Pallor: No Moisture Rubor: No No Abnormalities Noted: No Dry / Scaly: No Temperature / Pain Maceration: No Temperature: No Abnormality Wound Preparation Ulcer Cleansing: Rinsed/Irrigated with Saline Topical Anesthetic Applied: None Electronic Signature(s) Signed: 07/11/2017 8:00:35 AM By: Alric Quan Entered By: Alric Quan on 07/09/2017 08:18:28 [name]  ([id number]) -------------------------------------------------------------------------------- Vitals Details Patient Name: [name] Date of Service: 07/09/2017 8:00 AM Medical Record Number: [id number] Patient Account Number: [id number] Date of Birth/Sex: 1980-08-31 (37 y.o. Male) Treating RN: Carolyne Fiscal, Debi Primary Care Gracemarie Skeet: [name] Other Clinician: Referring Arlind Klingerman: [name] Treating Laurence Folz/Extender: Ricard Dillon Weeks in Treatment: 31 Vital Signs Time Taken: 08:05 Temperature (F): 98.2 Height (in): 71 Pulse (bpm): 87 Weight (lbs): 338 Respiratory Rate (breaths/min): 18 Body Mass Index (BMI): 47.1 Blood Pressure (mmHg): 155/92 Capillary Blood Glucose (mg/dl): 180 Reference Range: 80 - 120 mg / dl Notes Patient asked for blood sugar to be checked. Electronic Signature(s) Signed: 07/09/2017 5:24:42 PM By: [name], [name], RN, [name], [name], [name] Entered By: [name], [name], RN, [name], [name] on 07/09/2017 08:32:19

## 2017-07-15 ENCOUNTER — Encounter: Payer: Self-pay | Admitting: Primary Care

## 2017-07-15 ENCOUNTER — Ambulatory Visit (INDEPENDENT_AMBULATORY_CARE_PROVIDER_SITE_OTHER): Payer: 59 | Admitting: Primary Care

## 2017-07-15 VITALS — BP 122/80 | HR 96 | Temp 98.8°F | Wt 363.5 lb

## 2017-07-15 DIAGNOSIS — E1165 Type 2 diabetes mellitus with hyperglycemia: Secondary | ICD-10-CM | POA: Insufficient documentation

## 2017-07-15 DIAGNOSIS — R739 Hyperglycemia, unspecified: Secondary | ICD-10-CM | POA: Diagnosis not present

## 2017-07-15 DIAGNOSIS — R319 Hematuria, unspecified: Secondary | ICD-10-CM

## 2017-07-15 DIAGNOSIS — E119 Type 2 diabetes mellitus without complications: Secondary | ICD-10-CM | POA: Insufficient documentation

## 2017-07-15 LAB — HEMOGLOBIN A1C: Hgb A1c MFr Bld: 8.2 % — ABNORMAL HIGH (ref 4.6–6.5)

## 2017-07-15 LAB — MICROALBUMIN / CREATININE URINE RATIO
Creatinine,U: 45.3 mg/dL
Microalb Creat Ratio: 1.5 mg/g (ref 0.0–30.0)
Microalb, Ur: <0.7 mg/dL (ref 0.0–1.9)

## 2017-07-15 LAB — POC URINALSYSI DIPSTICK (AUTOMATED)
Bilirubin, UA: NEGATIVE
Blood, UA: NEGATIVE
Ketones, UA: NEGATIVE
Leukocytes, UA: NEGATIVE
Nitrite, UA: NEGATIVE
Protein, UA: NEGATIVE
Spec Grav, UA: 1.015 (ref 1.010–1.025)
Urobilinogen, UA: 0.2 E.U./dL
pH, UA: 6 (ref 5.0–8.0)

## 2017-07-15 NOTE — Patient Instructions (Signed)
Stop by the lab prior to leaving today. I will notify you of your results once received.   It is important that you improve your diet. Please limit carbohydrates in the form of white bread, rice, pasta, sweets, fast food, fried food, sugary drinks, etc. Increase your consumption of fresh fruits and vegetables, whole grains, lean protein.  Ensure you are consuming 64 ounces of water daily.  Start exercising. You should be getting 150 minutes of moderate intensity exercise weekly.  It was a pleasure to see you today!  

## 2017-07-15 NOTE — Progress Notes (Signed)
Subjective:    Patient ID: [name], male    DOB: 18-May-1981, 37 y.o.   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  He is currently taking prednisone per dermatology for pyoderma for which he's taken since November 2018, will be on this for another 1-2 months. Currently taking 60 mg once daily.   He underwent bioscreening health test through work 2 weeks ago and was noted to have a fasting glucose reading of 268. One week later he was checked again at the wound center and had a fasting reading of 168. He's not had his blood sugar checked since.  Wt Readings from Last 3 Encounters:  07/15/17 (!) 363 lb 8 oz (164.9 kg)  02/20/17 (!) 347 lb (157.4 kg)  11/25/16 (!) 339 lb 12.8 oz (154.1 kg)    He has noticed increased weight gain secondary to polyphagia, also with symptoms of polyuria, polydipsia, and hematuria x 1 day. He denies chest pain, dizziness, visual changes.  Diet currently consists of:  Breakfast: Skips Lunch: Fast food Dinner: Meat, vegetable, starch, pasta Snacks: Cookies, chips, candy bar Desserts: Daily Beverages: Diet soda, some water, energy drink, occasional juice  Exercise: He does not currently exercise   Review of Systems  Eyes: Negative for visual disturbance.  Respiratory: Negative for shortness of breath.   Cardiovascular: Negative for chest pain.  Endocrine: Positive for polydipsia and polyuria.  Neurological: Negative for dizziness and numbness.       Past Medical History:  Diagnosis Date  . Blood in stool   . Depression   . Elevated blood pressure   . Hyperlipidemia   . OSA (obstructive sleep apnea) 2014  . Seasonal allergies      Social History   Socioeconomic History  . Marital status: Married    Spouse name: Not on file  . Number of children: Not on file  . Years  of education: Not on file  . Highest education level: Not on file  Social Needs  . Financial resource strain: Not on file  . Food insecurity - worry: Not on file  . Food insecurity - inability: Not on file  . Transportation needs - medical: Not on file  . Transportation needs - non-medical: Not on file  Occupational History  . Not on file  Tobacco Use  . Smoking status: Former Games developer  . Smokeless tobacco: Former Neurosurgeon    Quit date: 03/17/2015  . Tobacco comment: Quit in [place] smoker for 9 years  Substance and Sexual Activity  . Alcohol use: Yes    Alcohol/week: 0.0 oz    Comment: soical  . Drug use: Not on file  . Sexual activity: Not on file  Other Topics Concern  . Not on file  Social History Narrative   Married.   7 kids.   Works as a Magazine features editor at [employer].   Enjoys target shooting.     Past Surgical History:  Procedure Laterality Date  . SEPTOPLASTY  2007  . WRIST SURGERY      Family History  Problem Relation Age of Onset  . Alcohol abuse Paternal Aunt   . Alcohol abuse Paternal Uncle   . Stroke Paternal Uncle   . Hyperlipidemia Maternal Grandfather   . Hypertension Maternal Grandfather   . Diabetes Maternal Grandfather   . Alcohol abuse Maternal Grandfather   .  Multiple sclerosis Mother   . Dementia Mother     Allergies  Allergen Reactions  . Biaxin [Clarithromycin] Other (See Comments)    Causes colitis flares  . Milk-Related Compounds Hives    Current Outpatient Medications on File Prior to Visit  Medication Sig Dispense Refill  . acetaminophen (TYLENOL) 500 MG tablet Take 1,000 mg by mouth every 6 (six) hours as needed for headache (pain).    . cetirizine (ZYRTEC) 10 MG tablet Take 1 tablet (10 mg total) by mouth daily. 90 tablet 1  . ibuprofen (ADVIL,MOTRIN) 200 MG tablet Take 400-600 mg by mouth every 6 (six) hours as needed for headache (pain).    . predniSONE (DELTASONE) 20 MG tablet Take 3 tabs (60 mg total) daily.     . ranitidine  (ZANTAC) 150 MG tablet Take 150 mg by mouth 2 (two) times daily as needed for heartburn (acid reflux).     . venlafaxine XR (EFFEXOR-XR) 150 MG 24 hr capsule Take 1 capsule (150 mg total) by mouth daily with breakfast. 90 capsule 3   No current facility-administered medications on file prior to visit.     BP 122/80   Pulse 96   Temp 98.8 F (37.1 C) (Oral)   Wt (!) 363 lb 8 oz (164.9 kg)   SpO2 94%   BMI 49.30 kg/m    Objective:   Physical Exam  Constitutional: He is oriented to person, place, and time. He appears well-nourished.  Neck: Neck supple.  Cardiovascular: Normal rate and regular rhythm.  Pulmonary/Chest: Effort normal and breath sounds normal. He has no wheezes. He has no rales.  Neurological: He is alert and oriented to person, place, and time.  Skin: Skin is warm and dry.  Psychiatric: He has a normal mood and affect.          Assessment & Plan:

## 2017-07-15 NOTE — Assessment & Plan Note (Signed)
Fasting blood glucose readings suspicious for type 2 diabetes, especially given chronic use of prednisone. A1C and urine microalbumin pending today. He is not fasting today, will defer lipid panel until next visit.   Long discussion regarding diet and exercise, he will start working on changes.   Await labs, anticipate need for Metformin at minimum, discussed this with patient.

## 2017-07-16 ENCOUNTER — Encounter: Payer: Self-pay | Admitting: Primary Care

## 2017-07-16 MED ORDER — METFORMIN HCL 500 MG PO TABS
500.0000 mg | ORAL_TABLET | Freq: Two times a day (BID) | ORAL | 1 refills | Status: DC
Start: 2017-07-16 — End: 2017-10-29

## 2017-08-04 MED ORDER — PREDNISONE 20 MG TABLET
ORAL_TABLET | Freq: Every day | ORAL | 0 refills | 0.00000 days | Status: CP
Start: 2017-08-04 — End: 2017-10-07

## 2017-08-06 ENCOUNTER — Encounter: Payer: 59 | Attending: Nurse Practitioner | Admitting: Nurse Practitioner

## 2017-08-06 DIAGNOSIS — L88 Pyoderma gangrenosum: Secondary | ICD-10-CM | POA: Insufficient documentation

## 2017-08-06 DIAGNOSIS — K5190 Ulcerative colitis, unspecified, without complications: Secondary | ICD-10-CM | POA: Diagnosis not present

## 2017-08-06 DIAGNOSIS — I872 Venous insufficiency (chronic) (peripheral): Secondary | ICD-10-CM | POA: Insufficient documentation

## 2017-08-06 DIAGNOSIS — L97222 Non-pressure chronic ulcer of left calf with fat layer exposed: Secondary | ICD-10-CM | POA: Diagnosis not present

## 2017-08-06 DIAGNOSIS — I10 Essential (primary) hypertension: Secondary | ICD-10-CM | POA: Insufficient documentation

## 2017-08-06 DIAGNOSIS — F17200 Nicotine dependence, unspecified, uncomplicated: Secondary | ICD-10-CM | POA: Insufficient documentation

## 2017-08-06 DIAGNOSIS — L97223 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sis of muscle: Secondary | ICD-10-CM | POA: Diagnosis not present

## 2017-08-10 NOTE — Progress Notes (Signed)
[name], Anthonny ([id number]) Visit Report for 08/06/2017 Chief Complaint Document Details Patient Name: [name], [name] Date of Service: 08/06/2017 8:00 AM Medical Record Number: [id number] Patient Account Number: [id number] Date of Birth/Sex: 11/04/1980 (37 y.o. 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 M) Treating RN: Huel CoventryWoody, [name] Primary Care Provider: Vernona RiegerLARK, KATHERINE Other Clinician: Referring Provider: Vernona RiegerLARK, KATHERINE Treating Provider/Extender: Kathreen Cosieroulter, Luigi Stuckey Weeks in Treatment: 35 Debridement Performed for Wound #1 Left,Lateral Lower Leg Assessment: Performed By: Physician Bonnell Publicoulter, Nusayba Cadenas, NP Debridement Type: Chemical/enzymatic Debridement Description: Non-Selective Pre-procedure Verification/Time Yes - 08:30 Out Taken: Start Time: 08:30 End Time: 08:30 Procedural Pain: 0 Post Procedural Pain: 0 Response to Treatment: Procedure was tolerated well Post Debridement Measurements of Total Wound Length: (cm) 3.6 Width: (cm) 2.8 Depth: (cm) 0.1 Volume: (cm) 0.792 Character of Wound/Ulcer Post Debridement: Requires Further Debridement Post Procedure Diagnosis Same as Pre-procedure Electronic Signature(s) Signed: 08/06/2017 8:36:42 AM By: Elliot GurneyWoody, [name], RN, [name], [name], [name] Signed: 08/06/2017 5:31:19 PM By: Bonnell Publicoulter,  Harriette Tovey Entered By: Elliot GurneyWoody, [name], RN, [name], [name] on 08/06/2017 08:36:42 [name], [name] ([id number]) -------------------------------------------------------------------------------- HPI Details Patient Name: [name], [name] Date of Service: 08/06/2017 8:00 AM Medical Record Number: [id number] Patient Account Number: [id number] Date of Birth/Sex: 01/22/1981 (36 y.o. M) Treating RN: Huel CoventryWoody, [name] Primary Care Provider: Vernona RiegerLARK, KATHERINE Other Clinician: Referring Provider: Vernona RiegerLARK, KATHERINE Treating Provider/Extender: Kathreen Cosieroulter, Aleana Fifita Weeks in Treatment: 35 History of Present Illness HPI Description: 12/04/16; 37 year old man who comes into the clinic today for review of a wound on the posterior left calf. He tells me that is been there for about a year. He is not a diabetic he does smoke half a pack per day. He was seen in the ER on 11/20/16 felt to have cellulitis around the wound and was given clindamycin. An x-ray did not show osteomyelitis. The patient initially tells me that he has a milk allergy that sets off a pruritic itching rash on his lower legs which she scratches incessantly and he thinks that's what may have set up the wound. He has been using various topical antibiotics and ointments without any effect. He works in a [employer]ing [employer] and is on his feet all day. He does not have a prior history of wounds however he does have the rash on both lower legs the right arm and the ventral aspect of his left arm. These are excoriations and clearly have had scratching however there are of macular looking areas on both legs including a substantial larger area on the right leg. This does not have an underlying open area. There is no blistering. The patient tells me that 2 years ago in [place] in response to the rash on his legs he saw a dermatologist who told him he had a condition which may be pyoderma gangrenosum although I may be putting words into his mouth. He seemed to recognize this. On further questioning he  admits to a 5 year history of quiesced. ulcerative colitis. He is not in any treatment for this. He's had no recent travel 12/11/16; the patient arrives today with his  wound and roughly the same condition we've been using silver alginate this is a deep punched out wound with some surrounding erythema but no tenderness. Biopsy I did did not show confirmed pyoderma gangrenosum suggested nonspecific inflammation and vasculitis but does not provide an actual description of what was seen by the pathologist. I'm really not able to understand this We have also received information from the patient's dermatologist in [place] notes from April 2016. This was a doctor Agarwal- antal. 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 He was prescribed topical steroid high potency under occlusion which helped but at this point the patient did not have a deep punched out wound. 12/18/16; the patient's wound is larger in terms of surface area however this surface looks better and there is less depth. The surrounding erythema also is better. The patient states that the wrap we put on came off 2 days ago when he has been using his compression stockings. He we are in the process of getting a dermatology consult. 12/26/16 on evaluation today patient's left lower extremity wound shows evidence of infection with surrounding erythema noted. He has been tolerating the dressing changes but states that he has noted more discomfort. There is a larger area of erythema surrounding the wound. No fevers, chills, nausea, or vomiting noted at this time. With that being said the wound still does have slough covering the surface. He is not allergic to any medication that he is aware of at this point. In regard to his right lower extremity he had several regions that are erythematous and pruritic he wonders if there's anything we can do to help that. 01/02/17 I reviewed patient's wound culture which was obtained his visit last week.  He was placed on doxycycline at that point. Unfortunately that does not appear to be an antibiotic that would likely help with the situation however the pseudomonas noted on culture is sensitive to Cipro. Also unfortunately patient's wound seems to have a large compared to last week's evaluation. Not severely so but there are definitely increased measurements in general. He is continuing to have discomfort as well he writes this to be a seven out of 10. In fact he would prefer me not to perform any debridement today due to the fact that he is having discomfort and considering he has an active infection on the little reluctant to do so anyway. No fevers, chills, nausea, or vomiting noted at this time. 01/08/17; patient seems dermatology on September 5. I suspect dermatology will want the slides from the biopsy I did sent to their pathologist. I'm not sure if there is a way we can expedite that. In any case the culture I did before I left on vacation 3 weeks ago showed Pseudomonas he was given 10 days of Cipro and per her description of her intake nurses is actually somewhat better this week although the wound is quite a bit bigger than I remember the last time I saw this. He still has 3 more days of Cipro CLARKE, PERETZ ([id number]) 01/21/17; dermatology appointment tomorrow. He has completed the ciprofloxacin for Pseudomonas. Surface of the wound looks better however he is had some deterioration in the lesions on his right leg. Meantime the left lateral leg wound we will continue with sample 01/29/17; patient had his dermatology appointment but I can't yet see that note. He is completed his antibiotics. The wound is more superficial but considerably larger in circumferential area than when he came in. This is in his left  lateral calf. He also has swollen erythematous areas with superficial wounds on the right leg and small papular areas on both arms. There apparently areas in her his upper thighs and  buttocks I did not look at those. Dermatology biopsied the right leg. Hopefully will have their input next week. 02/05/17; patient went back to see his dermatologist who told him that he had a "scratching problem" as well as staph. He is now on a 30 day course of doxycycline and I believe she gave him triamcinolone cream to the right leg areas to help with the itching [not exactly sure but probably triamcinolone]. She apparently looked at the left lateral leg wound although this was not rebiopsied and I think felt to be ultimately part of the same pathogenesis. He is using sample border foam and changing nevus himself. He now has a new open area on the right posterior leg which was his biopsy site I don't have any of the dermatology notes 02/12/17; we put the patient in compression last week with SANTYL to the wound on the left leg and the biopsy. Edema is much better and the depth of the wound is now at level of skin. Area is still the same oBiopsy site on the right lateral leg we've also been using santyl with a border foam dressing and he is changing this himself. 02/19/17; Using silver alginate started last week to both the substantial left leg wound and the biopsy site on the right wound. He is tolerating compression well. Has a an appointment with his primary M.D. tomorrow wondering about diuretics although I'm wondering if the edema problem is actually lymphedema 02/26/17; the patient has been to see his primary doctor Dr. Jerelyn Charles at [facility] our primary care. She started him on Lasix 20 mg and this seems to have helped with the edema. However we are not making substantial change with the left lateral calf wound and inflammation. The biopsy site on the right leg also looks stable but not really all that different. 03/12/17; the patient has been to see vein and vascular Dr. Wyn Quaker. He has had venous reflux studies I have not reviewed these. I did get a call from his dermatology office. They  felt that he might have pathergy based on their biopsy on his right leg which led them to look at the slides of the biopsy I did on the left leg and they wonder whether this represents pyoderma gangrenosum which was the original supposition in a man with ulcerative colitis albeit inactive for many years. They therefore recommended clobetasol and tetracycline i.e. 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 03/26/17; apparently the patient just had reflux studies not an appointment with Dr. dew. She arrives in clinic today having applied clobetasol for 2-3 weeks. He notes over the last 2-3 days excessive drainage having to change the dressing 3-4 times a day and also expanding erythema. 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 Carles Collet at [facility] dermatology locally who kindly saw him at my request. 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 My concern was the degree of erythema medially extending from his left leg wound which was either inflammation from pyoderma or cellulitis. I put him on  Augmentin however culture of the wound showed Pseudomonas which is quinolone sensitive. I really don't believe he has cellulitis however in view of everything I will continue and give him a course of Cipro. He is also on doxycycline as an immune modulator for the pyoderma. In addition to his original wound on the left lateral leg with surrounding erythema he has a wound on the right posterior calf which was an original biopsy site done by dermatology. 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 Saw Dr. Carles Collet yesterday. He has been  using topical antibiotics to both wound areas his original wound on the left and the biopsies/pathergy area on the right. There is definitely some improvement in the inflammation around the wound on the right although the patient states he has increasing sensitivity of the wounds. He is on prednisone 60 and doxycycline 1 as prescribed by Dr. Carles Collet. He is covering the topical antibiotic with gauze and putting this in his own compression stocks and changing this daily. He states that Dr. Aquilla Hacker did a culture of the left leg wound yesterday 05/07/17; pyoderma gangrenosum. The patient saw Dr. Carles Collet yesterday and has a follow-up with her in one month. He is still using topical antibiotics to both wounds although he can't recall exactly what type. He is still on prednisone 60 mg. Dr. Carles Collet stated that the doxycycline could stop if we were in agreement. 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 The right medial leg was induced by biopsy/pathergy. The area on the right is essentially healed. 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 The right medial leg has closed and remains closed. He is still on prednisone 60. oHe tells me he missed his last dermatology appointment with Dr. Carles Collet but will make another appointment. He reports that her blood sugar at a recent screen in [place] was high 200's. He was 180 today. He is more cushingoid blood pressure is Ebbs, Tomas ([id number]) up a bit. I think he is going to require still much longer prednisone perhaps another 3 months before attempting to taper. In the meantime his wound is a lot better. Smaller. He is cleaning this off daily and applying topical antibiotics. When he was last in the clinic I thought about [facility] and  actually put in a couple of calls to dermatology although probably not during their business hours. 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 He continues on oral prednisone. He has been using triple antibiotic ointment. There is surface debris and we will transition to [facility] and have him return in 2 weeks. He has lost 30 pounds since his last appointment with lifestyle modification. He may benefit from topical steroid cream for treatment this can be considered at a later date. Electronic Signature(s) Signed: 08/06/2017 8:36:57 AM By: Bonnell Public Entered By: Bonnell Public on 08/06/2017 08:36:57 [name] ([id number]) -------------------------------------------------------------------------------- Physician Orders Details Patient Name: [name] Date of Service: 08/06/2017 8:00 AM Medical Record Number: [id number] Patient Account Number: [id number] Date of Birth/Sex: [birth year] (36 y.o. M) Treating RN: Huel Coventry Primary Care Provider: [name] Other Clinician: Referring Provider: [name] Treating Provider/Extender: Kathreen Cosier in Treatment: [phone]  Verbal / Phone Orders: No Diagnosis Coding Wound Cleansing Wound #1 Left,Lateral Lower Leg o Clean wound with Normal Saline. Anesthetic (add to Medication List) Wound #1 Left,Lateral Lower Leg o Topical Lidocaine 4% cream applied to wound bed prior to debridement (In Clinic Only). Primary Wound Dressing Wound #1 Left,Lateral Lower Leg o Santyl Ointment Secondary Dressing Wound #1 Left,Lateral Lower Leg o Boardered Foam Dressing Dressing Change Frequency Wound #1 Left,Lateral Lower Leg o Change dressing every day. Follow-up Appointments Wound #1 Left,Lateral Lower Leg o Return Appointment in 2 weeks. Edema Control Wound #1 Left,Lateral Lower Leg o Patient to wear  own compression stockings Patient Medications Allergies: mik, Biaxin, seasonal Notifications Medication Indication Start End Santyl 08/07/2017 DOSE topical 250 unit/gram ointment - ointment topical nickel thick application daily Electronic Signature(s) Signed: 08/06/2017 8:39:46 AM By: Bonnell Public Entered By: Bonnell Public on 08/06/2017 08:39:46 [name], Cailan ([id number]) CORNIE, HERRINGTON ([id number]) -------------------------------------------------------------------------------- Problem List Details Patient Name: [name] Date of Service: 08/06/2017 8:00 AM Medical Record Number: [id number] Patient Account Number: [id number] Date of Birth/Sex: 1981-02-22 (36 y.o. 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 12/04/2016 Yes L88 Pyoderma gangrenosum 03/26/2017 Yes I87.2 Venous insufficiency (chronic) (peripheral) 12/04/2016 Yes Inactive Problems ICD-10 Code Description Active Date Inactive Date L97.213 Non-pressure chronic ulcer of right calf with necrosis of muscle 04/02/2017 04/02/2017 Resolved Problems Electronic Signature(s) Signed: 08/06/2017 8:34:25 AM By: Bonnell Public Entered By: Bonnell Public on 08/06/2017 08:34:24 [name] ([id number]) -------------------------------------------------------------------------------- Progress Note Details Patient Name: [name] Date of Service: 08/06/2017 8:00 AM Medical Record Number: [id number] Patient Account Number: [id number] Date of Birth/Sex: 03-Jun-1980 (36 y.o. M) Treating RN: Huel Coventry Primary Care Provider: [name] Other Clinician: Referring Provider: [name] Treating Provider/Extender: Kathreen Cosier in Treatment: 35 Subjective Chief Complaint Information obtained from Patient He is here in follow up evaluation for LLE pyoderma ulcer History of Present Illness (HPI) 12/04/16; 37 year old man who comes into the clinic today for review of a wound on the posterior left calf. He tells me that is been there for about a year. He is not a diabetic he does smoke half a pack per day. He was seen in the ER on 11/20/16 felt to have cellulitis around the wound and was given clindamycin. An x-ray did not show osteomyelitis. The patient initially tells me that he has a milk allergy that sets off a pruritic itching rash on his lower legs which she scratches incessantly and he thinks that's what may have set up the wound. He has been using various topical antibiotics and ointments without any effect. He works in a [employer]ing [employer] and is on his feet all day. He does not have a prior history of wounds however he does have the rash on both lower legs the right arm and the ventral aspect of his left arm. These are excoriations and clearly have had scratching however there are of macular looking areas on both legs including a substantial larger area on the right leg. This does not have an underlying open area. There is no blistering. The patient tells me that 2 years ago in [place] in response to the rash on his legs he saw a dermatologist who told him he had a condition which may be pyoderma gangrenosum although I may be  putting words into his mouth. He seemed to recognize this. 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 He is not in any treatment for this. He's had no recent travel 12/11/16; the patient arrives today with his wound and roughly the same condition we've been using silver alginate this is a deep punched out wound with some surrounding erythema but no tenderness. Biopsy I did did not show confirmed pyoderma gangrenosum suggested nonspecific  inflammation and vasculitis but does not provide an actual description of what was seen by the pathologist. I'm really not able to understand this We have also received information from the patient's dermatologist in [place] notes from April 2016. This was a doctor Agarwal- antal. 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 He was prescribed topical steroid high potency under occlusion which helped but at this point the patient did not have a deep punched out wound. 12/18/16; the patient's wound is larger in terms of surface area however this surface looks better and there is less depth. The surrounding erythema also is better. The patient states that the wrap we put on came off 2 days ago when he has been using his compression stockings. He we are in the process of getting a dermatology consult. 12/26/16 on evaluation today patient's left lower extremity wound shows evidence of infection with surrounding erythema noted. He has been tolerating the dressing changes but states that he has noted more discomfort. There is a larger area of erythema surrounding the wound. No fevers, chills, nausea, or vomiting noted at this time. With that being said the wound still does have slough covering the surface. He is not allergic to any medication that he is aware of at this point. In regard to his right lower extremity he had several regions that are erythematous and pruritic he wonders if there's anything we can do to help that. 01/02/17 I reviewed patient's wound culture which was obtained his visit last week. He was placed on doxycycline at that point. Unfortunately that does not appear to be an antibiotic that would likely help with the situation however the pseudomonas noted on culture is sensitive to Cipro. Also unfortunately patient's wound seems to have a large compared to last week's evaluation. Not severely so but there are definitely increased measurements in general. He is continuing to have  discomfort as well he writes this to be a seven out of 10. In fact he would prefer me not to perform any debridement today due to the fact that he is having discomfort and considering he has an active infection on the little reluctant to do so anyway. No fevers, Victorian, Lautaro ([id number]) chills, nausea, or vomiting noted at this time. 01/08/17; patient seems dermatology on September 5. I suspect dermatology will want the slides from the biopsy I did sent to their pathologist. I'm not sure if there is a way we can expedite that. In any case the culture I did before I left on vacation 3 weeks ago showed Pseudomonas he was given 10 days of Cipro and per her description of her intake nurses is actually somewhat better this week although the wound is quite a bit bigger than I remember the last time I saw this. He still has 3 more days of Cipro 01/21/17; dermatology appointment tomorrow. He has completed the ciprofloxacin for Pseudomonas. Surface of the wound looks better however he is had some deterioration in the lesions on his right leg. Meantime the left lateral leg  wound we will continue with sample 01/29/17; patient had his dermatology appointment but I can't yet see that note. He is completed his antibiotics. The wound is more superficial but considerably larger in circumferential area than when he came in. This is in his left lateral calf. He also has swollen erythematous areas with superficial wounds on the right leg and small papular areas on both arms. There apparently areas in her his upper thighs and buttocks I did not look at those. Dermatology biopsied the right leg. Hopefully will have their input next week. 02/05/17; patient went back to see his dermatologist who told him that he had a "scratching problem" as well as staph. He is now on a 30 day course of doxycycline and I believe she gave him triamcinolone cream to the right leg areas to help with the itching [not exactly sure but probably  triamcinolone]. She apparently looked at the left lateral leg wound although this was not rebiopsied and I think felt to be ultimately part of the same pathogenesis. He is using sample border foam and changing nevus himself. He now has a new open area on the right posterior leg which was his biopsy site I don't have any of the dermatology notes 02/12/17; we put the patient in compression last week with SANTYL to the wound on the left leg and the biopsy. Edema is much better and the depth of the wound is now at level of skin. Area is still the same Biopsy site on the right lateral leg we've also been using santyl with a border foam dressing and he is changing this himself. 02/19/17; Using silver alginate started last week to both the substantial left leg wound and the biopsy site on the right wound. He is tolerating compression well. Has a an appointment with his primary M.D. tomorrow wondering about diuretics although I'm wondering if the edema problem is actually lymphedema 02/26/17; the patient has been to see his primary doctor Dr. Jerelyn Charles at [facility] our primary care. She started him on Lasix 20 mg and this seems to have helped with the edema. However we are not making substantial change with the left lateral calf wound and inflammation. The biopsy site on the right leg also looks stable but not really all that different. 03/12/17; the patient has been to see vein and vascular Dr. Wyn Quaker. He has had venous reflux studies I have not reviewed these. I did get a call from his dermatology office. 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 They therefore recommended clobetasol and tetracycline i.e. aggressive treatment for possible  pyoderma gangrenosum. 03/26/17; apparently the patient just had reflux studies not an appointment with Dr. dew. She arrives in clinic today having applied clobetasol for 2-3 weeks. He notes over the last 2-3 days excessive drainage having to change the dressing 3-4 times a day and also expanding erythema. 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 Carles Collet at [facility] dermatology locally who kindly saw him  at my request. 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 My concern was the degree of erythema medially extending from his left leg wound which was either inflammation from pyoderma or cellulitis. I put him on Augmentin however culture of the wound showed Pseudomonas which is quinolone sensitive. I really don't believe he has cellulitis however in view of everything I will continue and give him a course of Cipro. He is also on doxycycline as an immune modulator for the pyoderma. In addition to his original wound on the left lateral leg with surrounding erythema he has a wound on the right posterior calf which was an original biopsy site done by dermatology. 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 Saw Dr. Carles Collet yesterday. He has been using topical antibiotics to both wound areas his original wound on the left and the biopsies/pathergy area on the right. There is definitely some improvement in the inflammation around the wound on the right although the patient states he has increasing sensitivity of the wounds. He is on prednisone 60 and doxycycline 1 as prescribed by Dr. Carles Collet. He is covering the topical antibiotic with  gauze and putting this in his own compression stocks and changing this daily. He states that Dr. Aquilla Hacker did a culture of the left leg wound yesterday 05/07/17; pyoderma gangrenosum. The patient saw Dr. Carles Collet yesterday and has a follow-up with her in one month. He is still using topical antibiotics to both wounds although he can't recall exactly what type. He is still on prednisone 60 mg. Dr. Carles Collet stated that the doxycycline could stop if we were in agreement. 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 The right medial leg was induced by [name] ([id number]) biopsy/pathergy. The area on the right is essentially healed. 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 The right medial leg has closed and remains closed. He is still on prednisone 60. He tells me he missed his last dermatology appointment with Dr. Carles Collet but will make another appointment. He reports that her blood sugar at a recent screen in [place] was high 200's. He was 180 today. 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 I think he is going to require still much longer prednisone perhaps another 3 months before attempting to taper. In the meantime his wound is a lot better. Smaller. He is cleaning this off daily and applying topical antibiotics. When he was last in the clinic I thought about changing to [facility] and actually put in a couple of calls to dermatology although probably not during their business hours. 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 He continues on oral prednisone. He has been using triple antibiotic ointment. There is surface debris and we will transition to  [facility] and have him return in 2 weeks. He has lost 30 pounds since his last appointment with lifestyle modification. He may benefit from topical steroid cream for treatment this can be considered at a later date. Patient History Information obtained from Patient. Family History Diabetes - Mother,Maternal Grandparents, Heart Disease - Mother,Maternal Grandparents, Hereditary Spherocytosis - Siblings, Hypertension - Maternal Grandparents, Stroke -  Siblings, No family history of Cancer, Kidney Disease, Lung Disease, Seizures, Thyroid Problems, Tuberculosis. Social History Current some day smoker, Marital Status - Married, Alcohol Use - Rarely, Drug Use - No History, Caffeine Use - Moderate. Objective Constitutional Vitals Time Taken: 8:08 AM, Height: 71 in, Weight: 338 lbs, BMI: 47.1, Temperature: 98.1 F, Pulse: 75 bpm, Respiratory Rate: 18 breaths/min, Blood Pressure: 143/95 mmHg. Integumentary (Hair, Skin) Wound #1 status is Open. Original cause of wound was Gradually Appeared. The wound is located on the Left,Lateral Lower Leg. The wound measures 3.6cm length x 2.8cm width x 0.2cm depth; 7.917cm^2 area and 1.583cm^3 volume. There is Fat Layer (Subcutaneous Tissue) Exposed exposed. There is no tunneling or undermining noted. There is a medium amount of serosanguineous drainage noted. The wound margin is distinct with the outline attached to the wound base. There is medium (34-66%) red granulation within the wound bed. There is a medium (34-66%) amount of necrotic tissue within the wound bed including Adherent Slough. The periwound skin appearance exhibited: Induration, Erythema. The periwound skin appearance did not exhibit: Callus, Crepitus, Excoriation, Rash, Scarring, Dry/Scaly, Maceration, Atrophie Blanche, Cyanosis, Ecchymosis, Hemosiderin Staining, Mottled, Pallor, Rubor. The surrounding wound skin color is noted with erythema which is circumferential. Periwound temperature was noted  as No Abnormality. The periwound has tenderness on palpation. SATISH, HAMMERS ([id number]) Assessment Active Problems ICD-10 [phone] - 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sis of muscle L88 - Pyoderma gangrenosum I87.2 - Venous insufficiency (chronic) (peripheral) Procedures Wound #1 Pre-procedure diagnosis of Wound #1 is a Pyoderma located on the Left,Lateral Lower Leg . There was a Non-Selective Chemical/enzymatic debridement (non-viable tissue was removed) performed by Bonnell Public, NP.Marland Kitchen A time out was conducted at 08:30, prior to the start of the procedure. The procedure was tolerated well with a pain level of 0 throughout and a pain level of 0 following the procedure. Post Debridement Measurements: 3.6cm length x 2.8cm width x 0.1cm depth; 0.792cm^3 volume. Character of Wound/Ulcer Post Debridement requires further debridement. Post procedure Diagnosis Wound #1: Same as Pre-Procedure Plan Wound Cleansing: Wound #1 Left,Lateral Lower Leg: Clean wound with Normal Saline. Anesthetic (add to Medication List): Wound #1 Left,Lateral Lower Leg: Topical Lidocaine 4% cream applied to wound bed prior to debridement (In Clinic Only). Primary Wound Dressing: Wound #1 Left,Lateral Lower Leg: Santyl Ointment Secondary Dressing: Wound #1 Left,Lateral Lower Leg: Boardered Foam Dressing Dressing Change Frequency: Wound #1 Left,Lateral Lower Leg: Change dressing every day. Follow-up Appointments: Wound #1 Left,Lateral Lower Leg: Return Appointment in 2 weeks. Edema Control: Wound #1 Left,Lateral Lower Leg: Patient to wear own compression stockings The following medication(s) was prescribed: Santyl topical 250 unit/gram ointment ointment topical nickel thick application daily starting 08/07/2017 Tirone, Daivd ([id number]) 1. Santyl daily 2. Follow-up in 2 weeks Electronic Signature(s) Signed: 08/06/2017 8:40:22 AM By: Bonnell Public Previous Signature: 08/06/2017 8:37:47 AM  Version By: Bonnell Public Entered By: Bonnell Public on 08/06/2017 08:40:22 BENJI, POYNTER ([id number]) -------------------------------------------------------------------------------- ROS/PFSH Details Patient Name: [name] Date of Service: 08/06/2017 8:00 AM Medical Record Number: [id number] Patient Account Number: [id number] Date of Birth/Sex: [birth year] (36 y.o. M) Treating RN: Huel Coventry Primary Care Provider: [name] Other Clinician: Referring Provider: [name] Treating Provider/Extender: Kathreen Cosier in Treatment: 30 Information Obtained From Patient Wound History Do you currently have one or more open woundso Yes How many open wounds do you currently haveo 1 Approximately how long have you had your woundso 1 year How have you been treating your wound(s) until nowo neosporin Has  your wound(s) ever healed and then re-openedo No Have you had any lab work done in the past montho No Have you tested positive for an antibiotic resistant organism (MRSA, VRE)o No Have you tested positive for osteomyelitis (bone infection)o No Have you had any tests for circulation on your legso No Have you had other problems associated with your woundso Infection, Swelling Eyes Medical History: Negative for: Cataracts; Glaucoma; Optic Neuritis Ear/Nose/Mouth/Throat Medical History: Negative for: Chronic sinus problems/congestion; Middle ear problems Hematologic/Lymphatic Medical History: Negative for: Anemia; Hemophilia; Human Immunodeficiency Virus; Lymphedema; Sickle Cell Disease Respiratory Medical History: Positive for: Sleep Apnea - cpap Negative for: Aspiration; Asthma; Chronic Obstructive Pulmonary Disease (COPD); Pneumothorax Cardiovascular Medical History: Positive for: Hypertension Negative for: Angina; Arrhythmia; Congestive Heart Failure; Coronary Artery Disease; Deep Vein Thrombosis; Hypotension; Myocardial Infarction; Peripheral Arterial Disease;  Peripheral Venous Disease; Phlebitis; Vasculitis Gastrointestinal Medical History: Positive for: Colitis Endocrine MAHLIK, LENN ([id number]) Medical History: Negative for: Type I Diabetes; Type II Diabetes Genitourinary Medical History: Negative for: End Stage Renal Disease Immunological Medical History: Negative for: Lupus Erythematosus; Raynaudos; Scleroderma Integumentary (Skin) Medical History: Negative for: History of Burn; History of pressure wounds Musculoskeletal Medical History: Negative for: Gout; Rheumatoid Arthritis; Osteoarthritis; Osteomyelitis Neurologic Medical History: Negative for: Dementia; Neuropathy; Quadriplegia; Paraplegia; Seizure Disorder Oncologic Medical History: Negative for: Received Chemotherapy; Received Radiation Psychiatric Medical History: Negative for: Anorexia/bulimia; Confinement Anxiety Immunizations Pneumococcal Vaccine: Received Pneumococcal Vaccination: No Implantable Devices Family and Social History Cancer: No; Diabetes: Yes - Mother,Maternal Grandparents; Heart Disease: Yes - Mother,Maternal Grandparents; Hereditary Spherocytosis: Yes - Siblings; Hypertension: Yes - Maternal Grandparents; Kidney Disease: No; Lung Disease: No; Seizures: No; Stroke: Yes - Siblings; Thyroid Problems: No; Tuberculosis: No; Current some day smoker; Marital Status - Married; Alcohol Use: Rarely; Drug Use: No History; Caffeine Use: Moderate; Financial Concerns: No; Food, Clothing or Shelter Needs: No; Support System Lacking: No; Transportation Concerns: No; Advanced Directives: No; Patient does not want information on Advanced Directives; Living Will: No Physician Affirmation I have reviewed and agree with the above information. Electronic Signature(s) Signed: 08/06/2017 5:31:19 PM By: Bonnell Public Signed: 08/08/2017 4:43:23 PM By: Elliot Gurney [name], RN, [name], [name], [name] [place], [place] ([id number]) Entered By: Bonnell Public on 08/06/2017 08:37:07 ASHDEN, SONNENBERG  ([id number]) -------------------------------------------------------------------------------- SuperBill Details Patient Name: [name] Date of Service: 08/06/2017 Medical Record Number: [id number] Patient Account Number: [id number] Date of Birth/Sex: 1981-04-14 (36 y.o. M) Treating RN: Huel Coventry Primary Care Provider: [name] Other Clinician: Referring Provider: [name] Treating Provider/Extender: Kathreen Cosier in Treatment: 35 Diagnosis Coding ICD-10 Codes Code Description [phone] 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sis of muscle L88 Pyoderma gangrenosum I87.2 Venous insufficiency (chronic) (peripheral) Facility Procedures CPT4 Code: 41324401 Description: 02725 - DEBRIDE W/O ANES NON SELECT Modifier: Quantity: 1 Physician Procedures CPT4 Code: [id number] Description: 99213 - WC PHYS LEVEL 3 - EST PT ICD-10 Diagnosis Description L88 Pyoderma gangrenosum L97.223 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sis of m Modifier: uscle Quantity: 1 Electronic Signature(s) Signed: 08/06/2017 9:05:18 AM By: Elliot Gurney, [name], RN, [name], [name], [name] Signed: 08/06/2017 5:31:19 PM By: Bonnell Public Previous Signature: 08/06/2017 8:38:03 AM Version By: Bonnell Public Entered By: Elliot Gurney [name], RN, [name], [name] on 08/06/2017 09:05:17

## 2017-08-10 NOTE — Progress Notes (Signed)
FAHEEM, ZIEMANN ([id number]) Visit Report for 08/06/2017 Arrival Information Details Patient Name: [name], [name] Date of Service: 08/06/2017 8:00 AM Medical Record Number: [id number] Patient Account Number: [id number] Date of Birth/Sex: 1980/10/21 (37 y.o. M) Treating RN: Roger Shelter Primary Care Dayona Shaheen: [name] Other Clinician: Referring Tykeria Wawrzyniak: [name] Treating Earlee Herald/Extender: Cathie Olden in Treatment: 69 Visit Information History Since Last Visit All ordered tests and consults were completed: No Patient Arrived: Ambulatory Added or deleted any medications: Yes Arrival Time: 08:04 Any new allergies or adverse reactions: No Accompanied By: self Had a fall or experienced change in No Transfer Assistance: None activities of daily living that may affect Patient Identification Verified: Yes risk of falls: Secondary Verification Process Completed: Yes Signs or symptoms of abuse/neglect since last visito No Patient Requires Transmission-Based No Hospitalized since last visit: No Precautions: Pain Present Now: No Patient Has Alerts: No Electronic Signature(s) Signed: 08/06/2017 10:16:36 AM By: Roger Shelter Entered By: Roger Shelter on 08/06/2017 08:05:24 [name] ([id number]) -------------------------------------------------------------------------------- Clinic Level of Care Assessment Details Patient Name: [name] Date of Service: 08/06/2017 8:00 AM Medical Record Number: [id number] Patient Account Number: [id number] Date of Birth/Sex: 07-10-1980 (36 y.o. M) Treating RN: [name] Primary Care Iyauna Sing: [name] Other Clinician: Referring Andersen Mckiver: [name] Treating Ainslie Mazurek/Extender: Cathie Olden in Treatment: 35 Clinic Level of Care Assessment Items TOOL 4 Quantity Score []  - Use when only an EandM is performed on FOLLOW-UP visit 0 ASSESSMENTS - Nursing Assessment / Reassessment []  - Reassessment of  Co-morbidities (includes updates in patient status) 0 X- 1 5 Reassessment of Adherence to Treatment Plan ASSESSMENTS - Wound and Skin Assessment / Reassessment X - Simple Wound Assessment / Reassessment - one wound 1 5 []  - 0 Complex Wound Assessment / Reassessment - multiple wounds []  - 0 Dermatologic / Skin Assessment (not related to wound area) ASSESSMENTS - Focused Assessment []  - Circumferential Edema Measurements - multi extremities 0 []  - 0 Nutritional Assessment / Counseling / Intervention []  - 0 Lower Extremity Assessment (monofilament, tuning fork, pulses) []  - 0 Peripheral Arterial Disease Assessment (using hand held doppler) ASSESSMENTS - Ostomy and/or Continence Assessment and Care []  - Incontinence Assessment and Management 0 []  - 0 Ostomy Care Assessment and Management (repouching, etc.) PROCESS - Coordination of Care X - Simple Patient / Family Education for ongoing care 1 15 []  - 0 Complex (extensive) Patient / Family Education for ongoing care []  - 0 Staff obtains Programmer, systems, Records, Test Results / Process Orders []  - 0 Staff telephones HHA, Nursing Homes / Clarify orders / etc []  - 0 Routine Transfer to another Facility (non-emergent condition) []  - 0 Routine Hospital Admission (non-emergent condition) []  - 0 New Admissions / Biomedical engineer / Ordering NPWT, Apligraf, etc. []  - 0 Emergency Hospital Admission (emergent condition) X- 1 10 Simple Discharge Coordination [name], [name] ([id number]) []  - 0 Complex (extensive) Discharge Coordination PROCESS - Special Needs []  - Pediatric / Minor Patient Management 0 []  - 0 Isolation Patient Management []  - 0 Hearing / Language / Visual special needs []  - 0 Assessment of Community assistance (transportation, D/C planning, etc.) []  - 0 Additional assistance / Altered mentation []  - 0 Support Surface(s) Assessment (bed, cushion, seat, etc.) INTERVENTIONS - Wound Cleansing / Measurement X - Simple  Wound Cleansing - one wound 1 5 []  - 0 Complex Wound Cleansing - multiple wounds X- 1 5 Wound Imaging (photographs - any number of wounds) []  - 0 Wound Tracing (instead of photographs) X- 1 5  Simple Wound Measurement - one wound []  - 0 Complex Wound Measurement - multiple wounds INTERVENTIONS - Wound Dressings []  - Small Wound Dressing one or multiple wounds 0 []  - 0 Medium Wound Dressing one or multiple wounds X- 1 20 Large Wound Dressing one or multiple wounds []  - 0 Application of Medications - topical []  - 0 Application of Medications - injection INTERVENTIONS - Miscellaneous []  - External ear exam 0 []  - 0 Specimen Collection (cultures, biopsies, blood, body fluids, etc.) []  - 0 Specimen(s) / Culture(s) sent or taken to Lab for analysis []  - 0 Patient Transfer (multiple staff / Civil Service fast streamer / Similar devices) []  - 0 Simple Staple / Suture removal (25 or less) []  - 0 Complex Staple / Suture removal (26 or more) []  - 0 Hypo / Hyperglycemic Management (close monitor of Blood Glucose) []  - 0 Ankle / Brachial Index (ABI) - do not check if billed separately X- 1 5 Vital Signs [name], [name] ([id number]) Has the patient been seen at the hospital within the last three years: Yes Total Score: 75 Level Of Care: New/Established - Level 2 Electronic Signature(s) Signed: 08/08/2017 4:43:23 PM By: [name], [name], RN, [name], [name], [name] Entered By: [name], [name], RN, [name], [name] on 08/06/2017 08:31:53 [name] ([id number]) -------------------------------------------------------------------------------- Encounter Discharge Information Details Patient Name: [name] Date of Service: 08/06/2017 8:00 AM Medical Record Number: [id number] Patient Account Number: [id number] Date of Birth/Sex: August 14, 1980 (36 y.o. M) Treating RN: [name] Primary Care Vannah Nadal: [name] Other Clinician: Referring Jaeda Bruso: [name] Treating Rayquan Amrhein/Extender: Cathie Olden in  Treatment: 88 Encounter Discharge Information Items Discharge Pain Level: 0 Discharge Condition: Stable Ambulatory Status: Ambulatory Discharge Destination: Home Transportation: Private Auto Accompanied By: self Schedule Follow-up Appointment: Yes Medication Reconciliation completed and No provided to Patient/Care Sebastian Lurz: Patient Clinical Summary of Care: Declined Electronic Signature(s) Signed: 08/06/2017 10:55:00 AM By: Montey Hora Entered By: Montey Hora on 08/06/2017 10:54:59 [name] ([id number]) -------------------------------------------------------------------------------- Lower Extremity Assessment Details Patient Name: [name] Date of Service: 08/06/2017 8:00 AM Medical Record Number: [id number] Patient Account Number: [id number] Date of Birth/Sex: 28-Feb-1981 (36 y.o. M) Treating RN: Roger Shelter Primary Care Florabelle Cardin: [name] Other Clinician: Referring Aryanah Enslow: [name] Treating Jase Himmelberger/Extender: Cathie Olden in Treatment: 35 Edema Assessment Assessed: [Left: No] [Right: No] [Left: Edema] [Right: :] Calf Left: Right: Point of Measurement: 33 cm From Medial Instep 50.5 cm cm Ankle Left: Right: Point of Measurement: 11 cm From Medial Instep 29.5 cm cm Vascular Assessment Claudication: Claudication Assessment [Left:None] Pulses: Dorsalis Pedis Palpable: [Left:Yes] Posterior Tibial Extremity colors, hair growth, and conditions: Extremity Color: [Left:Hyperpigmented] Hair Growth on Extremity: [Left:Yes] Temperature of Extremity: [Left:Warm] Capillary Refill: [Left:< 3 seconds] Toe Nail Assessment Left: Right: Thick: No Discolored: No Deformed: No Improper Length and Hygiene: No Electronic Signature(s) Signed: 08/06/2017 10:16:36 AM By: Roger Shelter Entered By: Roger Shelter on 08/06/2017 08:17:27 [name]  ([id number]) -------------------------------------------------------------------------------- Multi Wound Chart Details Patient Name: [name] Date of Service: 08/06/2017 8:00 AM Medical Record Number: [id number] Patient Account Number: [id number] Date of Birth/Sex: 11/26/1980 (36 y.o. 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] [N/A:N/A] Comorbid History: [1:Sleep Apnea, Hypertension, Colitis] [N/A:N/A]  Date Acquired: [1:11/18/2015] [N/A:N/A] Weeks of Treatment: [1:35] [N/A:N/A] Wound Status: [1:Open] [N/A:N/A] Measurements L x W x D [1:3.6x2.8x0.2] [N/A:N/A] (cm) Area (cm) : [1:7.917] [N/A:N/A] Volume (cm) : [1:1.583] [N/A:N/A] % Reduction in Area: [1:-61.30%] [N/A:N/A] % Reduction in Volume: [1:59.70%] [N/A:N/A] Classification: [1:Full Thickness Without Exposed Support Structures] [N/A:N/A] Exudate Amount: [1:Medium] [N/A:N/A] Exudate Type: [1:Serosanguineous] [N/A:N/A] Exudate Color: [1:red, brown] [N/A:N/A] Wound Margin: [1:Distinct, outline attached] [N/A:N/A] Granulation Amount: [1:Medium (34-66%)] [N/A:N/A] Granulation Quality: [1:Red] [N/A:N/A] Necrotic Amount: [1:Medium (34-66%)] [N/A:N/A] Exposed Structures: [1:Fat Layer (Subcutaneous Tissue) Exposed: Yes Fascia: No Tendon: No Muscle: No Joint: No Bone: No] [N/A:N/A] Epithelialization: [1:None] [N/A:N/A] Periwound Skin Texture: [1:Induration: Yes Excoriation: No Callus: No Crepitus: No] [N/A:N/A] Rash: No Scarring: No Periwound Skin  Moisture: Maceration: No N/A N/A Dry/Scaly: No Periwound Skin Color: Erythema: Yes N/A N/A Atrophie Blanche: No Cyanosis: No Ecchymosis: No Hemosiderin Staining: No Mottled: No Pallor: No Rubor: No Erythema Location: Circumferential N/A N/A Erythema Change: Increased N/A N/A Temperature: No Abnormality N/A N/A Tenderness on Palpation: Yes N/A N/A Wound Preparation: Ulcer Cleansing: N/A N/A Rinsed/Irrigated with Saline Topical Anesthetic Applied: None, Other: Lidocaine 4% Treatment Notes Electronic Signature(s) Signed: 08/06/2017 8:35:01 AM By: Lawanda Cousins Entered By: Lawanda Cousins on 08/06/2017 08:35:00 [name] ([id number]) -------------------------------------------------------------------------------- Multi-Disciplinary Care Plan Details Patient Name: [name] Date of Service: 08/06/2017 8:00 AM Medical Record Number: [id number] Patient Account Number: [id number] Date of Birth/Sex: 1980/06/10 (36 y.o. M) Treating RN: [name] Primary Care Laurene Melendrez: [name] Other Clinician: Referring Shawntell Dixson: [name] Treating Tavonte Seybold/Extender: Cathie Olden in Treatment: 41 Active Inactive ` Orientation to the Wound Care Program Nursing Diagnoses: Knowledge deficit related to the wound healing center program Goals: Patient/caregiver will verbalize understanding of the [facility] Date Initiated: 12/11/2016 Target Resolution Date: 03/13/2017 Goal Status: Active Interventions: Provide education on orientation to the wound center Notes: ` Venous Leg Ulcer Nursing Diagnoses: Knowledge deficit related to disease process and management Potential for venous Insuffiency (use before diagnosis confirmed) Goals: Non-invasive venous studies are completed as ordered Date Initiated: 12/11/2016 Target Resolution Date: 03/13/2017 Goal Status: Active Patient will maintain optimal edema control Date Initiated: 12/11/2016 Target Resolution  Date: 03/13/2017 Goal Status: Active Patient/caregiver will verbalize understanding of disease process and disease management Date Initiated: 12/11/2016 Target Resolution Date: 03/13/2017 Goal Status: Active Verify adequate tissue perfusion prior to therapeutic compression application Date Initiated: 12/11/2016 Target Resolution Date: 03/13/2017 Goal Status: Active Interventions: Assess peripheral edema status every visit. Compression as ordered Provide education on venous insufficiency Treatment Activities: [name], [name] ([id number]) Therapeutic compression applied : 12/11/2016 Notes: ` Wound/Skin Impairment Nursing Diagnoses: Impaired tissue integrity Knowledge deficit related to ulceration/compromised skin integrity Goals: Patient/caregiver will verbalize understanding of skin care regimen Date Initiated: 12/11/2016 Target Resolution Date: 03/13/2017 Goal Status: Active Ulcer/skin breakdown will have a volume reduction of 30% by week 4 Date Initiated: 12/11/2016 Target Resolution Date: 03/13/2017 Goal Status: Active Ulcer/skin breakdown will have a volume reduction of 50% by week 8 Date Initiated: 12/11/2016 Target Resolution Date: 03/13/2017 Goal Status: Active Ulcer/skin breakdown will have a volume reduction of 80% by week 12 Date Initiated: 12/11/2016 Target Resolution Date: 03/13/2017 Goal Status: Active Ulcer/skin breakdown will heal within 14 weeks Date Initiated: 12/11/2016 Target Resolution Date: 03/13/2017 Goal Status: Active Interventions: Assess patient/caregiver ability to obtain necessary supplies Assess patient/caregiver ability to perform ulcer/skin care regimen upon admission and as needed Assess ulceration(s) every visit Provide education on ulcer and skin care Treatment Activities: Skin care regimen initiated : 12/11/2016 Topical wound management  initiated : 12/11/2016 Notes: Electronic Signature(s) Signed: 08/08/2017 4:43:23 PM By: [name], [name], RN, [name],  [name], [name] Entered By: [name], [name], RN, [name], [name] on 08/06/2017 08:29:52 [name], [name] ([id number]) -------------------------------------------------------------------------------- Pain Assessment Details Patient Name: [name] Date of Service: 08/06/2017 8:00 AM Medical Record Number: [id number] Patient Account Number: [id number] Date of Birth/Sex: 22-Dec-1980 (36 y.o. M) Treating RN: Roger Shelter Primary Care Jesusita Jocelyn: [name] Other Clinician: Referring Kylena Mole: [name] Treating Mccabe Gloria/Extender: Cathie Olden in Treatment: 39 Active Problems Location of Pain Severity and Description of Pain Patient Has Paino No Site Locations Pain Management and Medication Current Pain Management: Electronic Signature(s) Signed: 08/06/2017 10:16:36 AM By: Roger Shelter Entered By: Roger Shelter on 08/06/2017 08:07:36 [name] ([id number]) -------------------------------------------------------------------------------- Patient/Caregiver Education Details Patient Name: [name] Date of Service: 08/06/2017 8:00 AM Medical Record Number: [id number] Patient Account Number: [id number] Date of Birth/Gender: [birth year] (36 y.o. M) Treating RN: Montey Hora Primary Care Physician: [name] Other Clinician: Referring Physician: [name] Treating Physician/Extender: Cathie Olden in Treatment: 49 Education Assessment Education Provided To: Patient Education Topics Provided Wound/Skin Impairment: Handouts: Other: wound care as ordered Methods: Demonstration, Explain/Verbal Responses: State content correctly Electronic Signature(s) Signed: 08/06/2017 5:43:37 PM By: Montey Hora Entered By: Montey Hora on 08/06/2017 10:55:31 [name] ([id number]) -------------------------------------------------------------------------------- Wound Assessment Details Patient Name: [name] Date of Service: 08/06/2017 8:00 AM Medical  Record Number: [id number] Patient Account Number: [id number] Date of Birth/Sex: 1980/07/13 (36 y.o. M) Treating RN: Roger Shelter Primary Care Armani Gawlik: [name] Other Clinician: Referring Greysin Medlen: [name] Treating Beau Ramsburg/Extender: Cathie Olden in Treatment: 35 Wound Status Wound Number: 1 Primary Etiology: Pyoderma Wound Location: Left Lower Leg - Lateral Wound Status: Open Wounding Event: Gradually Appeared Comorbid History: Sleep Apnea, Hypertension, Colitis Date Acquired: 11/18/2015 Weeks Of Treatment: 35 Clustered Wound: No Photos Photo Uploaded By: Roger Shelter on 08/06/2017 10:05:45 Wound Measurements Length: (cm) 3.6 Width: (cm) 2.8 Depth: (cm) 0.2 Area: (cm) 7.917 Volume: (cm) 1.583 % Reduction in Area: -61.3% % Reduction in Volume: 59.7% Epithelialization: None Tunneling: No Undermining: No Wound Description Full Thickness Without Exposed Support Classification: Structures Wound Margin: Distinct, outline attached Exudate Medium Amount: Exudate Type: Serosanguineous Exudate Color: red, brown Foul Odor After Cleansing: No Slough/Fibrino Yes Wound Bed Granulation Amount: Medium (34-66%) Exposed Structure Granulation Quality: Red Fascia Exposed: No Necrotic Amount: Medium (34-66%) Fat Layer (Subcutaneous Tissue) Exposed: Yes Necrotic Quality: Adherent Slough Tendon Exposed: No Muscle Exposed: No Joint Exposed: No Bone Exposed: No [name], [name] ([id number]) Periwound Skin Texture Texture Color No Abnormalities Noted: No No Abnormalities Noted: No Callus: No Atrophie Blanche: No Crepitus: No Cyanosis: No Excoriation: No Ecchymosis: No Induration: Yes Erythema: Yes Rash: No Erythema Location: Circumferential Scarring: No Erythema Change: Increased Hemosiderin Staining: No Moisture Mottled: No No Abnormalities Noted: No Pallor: No Dry / Scaly: No Rubor: No Maceration: No Temperature / Pain Temperature: No  Abnormality Tenderness on Palpation: Yes Wound Preparation Ulcer Cleansing: Rinsed/Irrigated with Saline Topical Anesthetic Applied: None, Other: Lidocaine 4%, Treatment Notes Wound #1 (Left, Lateral Lower Leg) 1. Cleansed with: Clean wound with Normal Saline 2. Anesthetic Topical Lidocaine 4% cream to wound bed prior to debridement 4. Dressing Applied: Santyl Ointment 5. Secondary Dressing Applied Bordered Foam Dressing Dry Gauze Electronic Signature(s) Signed: 08/06/2017 10:16:36 AM By: Roger Shelter Entered By: Roger Shelter on 08/06/2017 08:13:57 [name], [name] ([id number]) -------------------------------------------------------------------------------- Vitals Details Patient Name: [name] Date of Service: 08/06/2017 8:00 AM Medical Record Number: [id number] Patient Account Number: [id number] Date of Birth/Sex: [birth year] (36  y.o. M) Treating RN: Roger Shelter Primary Care Amor Packard: [name] Other Clinician: Referring Miakoda Mcmillion: [name] Treating Meagen Limones/Extender: Cathie Olden in Treatment: 35 Vital Signs Time Taken: 08:08 Temperature (F): 98.1 Height (in): 71 Pulse (bpm): 75 Weight (lbs): 338 Respiratory Rate (breaths/min): 18 Body Mass Index (BMI): 47.1 Blood Pressure (mmHg): 143/95 Reference Range: 80 - 120 mg / dl Electronic Signature(s) Signed: 08/06/2017 10:16:36 AM By: Roger Shelter Entered By: Roger Shelter on 08/06/2017 64:29:03

## 2017-08-22 ENCOUNTER — Encounter: Payer: 59 | Attending: Physician Assistant | Admitting: Physician Assistant

## 2017-08-22 DIAGNOSIS — L97213 Non-pressure chronic ulcer of right calf with necrosis of muscle: Secondary | ICD-10-CM | POA: Diagnosis not present

## 2017-08-22 DIAGNOSIS — K5190 Ulcerative colitis, unspecified, without complications: Secondary | ICD-10-CM | POA: Diagnosis not present

## 2017-08-22 DIAGNOSIS — Z91011 Allergy to milk products: Secondary | ICD-10-CM | POA: Insufficient documentation

## 2017-08-22 DIAGNOSIS — L88 Pyoderma gangrenosum: Secondary | ICD-10-CM | POA: Diagnosis not present

## 2017-08-22 DIAGNOSIS — I10 Essential (primary) hypertension: Secondary | ICD-10-CM | POA: Diagnosis not present

## 2017-08-22 DIAGNOSIS — Z823 Family history of stroke: Secondary | ICD-10-CM | POA: Diagnosis not present

## 2017-08-22 DIAGNOSIS — I872 Venous insufficiency (chronic) (peripheral): Secondary | ICD-10-CM | POA: Insufficient documentation

## 2017-08-22 DIAGNOSIS — L97223 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sis of muscle: Secondary | ICD-10-CM | POA: Insufficient documentation

## 2017-08-22 DIAGNOSIS — L97222 Non-pressure chronic ulcer of left calf with fat layer exposed: Secondary | ICD-10-CM | POA: Diagnosis not present

## 2017-08-24 NOTE — Progress Notes (Signed)
LEMON, STERNBERG ([id number]) Visit Report for 08/22/2017 Chief Complaint Document Details Patient Name: [name], [name] Date of Service: 08/22/2017 8:00 AM Medical Record Number: [id number] Patient Account Number: [id number] Date of Birth/Sex: 1981-03-18 (38 y.o. M) Treating RN: Montey Hora Primary Care Provider: [name] Other Clinician: Referring Provider: [name] Treating Provider/Extender: Melburn Hake, Demeka Sutter Weeks in Treatment: 6 Information Obtained from: Patient Chief Complaint He is here in follow up evaluation for LLE pyoderma ulcer Electronic Signature(s) Signed: 08/22/2017 5:57:55 PM By: Worthy Keeler PA-C Entered By: Worthy Keeler on 08/22/2017 09:17:38 [name] ([id number]) -------------------------------------------------------------------------------- HPI Details Patient Name: [name] Date of Service: 08/22/2017 8:00 AM Medical Record Number: [id number] Patient Account Number: [id number] Date of Birth/Sex: 1981-02-04 (36 y.o. M) Treating RN: Montey Hora Primary Care Provider: [name] Other Clinician: Referring Provider: [name] Treating Provider/Extender: Melburn Hake, Mclain Freer Weeks in Treatment: 25 History of Present Illness HPI Description: 12/04/16; 37 year old man who comes into the clinic today for review of a wound on the posterior left calf. He tells me that is been there for about a year. He is not a diabetic he does smoke half a pack per day. He was seen in the ER on 11/20/16 felt to have cellulitis around the wound and was given clindamycin. An x-ray did not show osteomyelitis. The patient initially tells me that he has a milk allergy that sets off a pruritic itching rash on his lower legs which she scratches incessantly and he thinks that's what may have set up the wound. He has been using various topical antibiotics and ointments without any effect. He works in a [employer]ing [employer] and is on his feet all day. He does not have a prior  history of wounds however he does have the rash on both lower legs the right arm and the ventral aspect of his left arm. These are excoriations and clearly have had scratching however there are of macular looking areas on both legs including a substantial larger area on the right leg. This does not have an underlying open area. There is no blistering. The patient tells me that 2 years ago in [place] in response to the rash on his legs he saw a dermatologist who told him he had a condition which may be pyoderma gangrenosum although I may be putting words into his mouth. He seemed to recognize this. 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 He is not in any treatment for this. He's had no recent travel 12/11/16; the patient arrives today with his wound and roughly the same condition we've been using silver alginate this is a deep punched out wound with some surrounding erythema but no tenderness. Biopsy I did did not show confirmed pyoderma gangrenosum suggested nonspecific inflammation and vasculitis but does not provide an actual description of what was seen by the pathologist. I'm really not able to understand this We have also received information from the patient's dermatologist in [place] notes from April 2016. This was a doctor Agarwal- antal. 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 He was prescribed topical steroid high potency under occlusion which helped but at this point the patient did not have a deep punched out wound. 12/18/16; the patient's wound is larger in terms of surface area however this surface looks better and there is less depth. The surrounding erythema also is better. The patient states that the wrap we put on came off 2 days ago when he has been using  his compression stockings. He we are in the process of getting a dermatology consult. 12/26/16 on evaluation today patient's left lower extremity wound shows evidence of  infection with surrounding erythema noted. He has been tolerating the dressing changes but states that he has noted more discomfort. There is a larger area of erythema surrounding the wound. No fevers, chills, nausea, or vomiting noted at this time. With that being said the wound still does have slough covering the surface. He is not allergic to any medication that he is aware of at this point. In regard to his right lower extremity he had several regions that are erythematous and pruritic he wonders if there's anything we can do to help that. 01/02/17 I reviewed patient's wound culture which was obtained his visit last week. He was placed on doxycycline at that point. Unfortunately that does not appear to be an antibiotic that would likely help with the situation however the pseudomonas noted on culture is sensitive to Cipro. Also unfortunately patient's wound seems to have a large compared to last week's evaluation. Not severely so but there are definitely increased measurements in general. He is continuing to have discomfort as well he writes this to be a seven out of 10. In fact he would prefer me not to perform any debridement today due to the fact that he is having discomfort and considering he has an active infection on the little reluctant to do so anyway. No fevers, chills, nausea, or vomiting noted at this time. 01/08/17; patient seems dermatology on September 5. I suspect dermatology will want the slides from the biopsy I did sent to their pathologist. I'm not sure if there is a way we can expedite that. In any case the culture I did before I left on vacation 3 weeks ago showed Pseudomonas he was given 10 days of Cipro and per her description of her intake nurses is actually somewhat better this week although the wound is quite a bit bigger than I remember the last time I saw this. He still has 3 more days of Cipro [name], [name] ([id number]) 01/21/17; dermatology appointment tomorrow. He has  completed the ciprofloxacin for Pseudomonas. Surface of the wound looks better however he is had some deterioration in the lesions on his right leg. Meantime the left lateral leg wound we will continue with sample 01/29/17; patient had his dermatology appointment but I can't yet see that note. He is completed his antibiotics. The wound is more superficial but considerably larger in circumferential area than when he came in. This is in his left lateral calf. He also has swollen erythematous areas with superficial wounds on the right leg and small papular areas on both arms. There apparently areas in her his upper thighs and buttocks I did not look at those. Dermatology biopsied the right leg. Hopefully will have their input next week. 02/05/17; patient went back to see his dermatologist who told him that he had a "scratching problem" as well as staph. He is now on a 30 day course of doxycycline and I believe she gave him triamcinolone cream to the right leg areas to help with the itching [not exactly sure but probably triamcinolone]. She apparently looked at the left lateral leg wound although this was not rebiopsied and I think felt to be ultimately part of the same pathogenesis. He is using sample border foam and changing nevus himself. He now has a new open area on the right posterior leg which was his biopsy site I don't  have any of the dermatology notes 02/12/17; we put the patient in compression last week with SANTYL to the wound on the left leg and the biopsy. Edema is much better and the depth of the wound is now at level of skin. Area is still the same oBiopsy site on the right lateral leg we've also been using santyl with a border foam dressing and he is changing this himself. 02/19/17; Using silver alginate started last week to both the substantial left leg wound and the biopsy site on the right wound. He is tolerating compression well. Has a an appointment with his primary M.D. tomorrow  wondering about diuretics although I'm wondering if the edema problem is actually lymphedema 02/26/17; the patient has been to see his primary doctor Dr. Jerrel Ivory at [place] our primary care. She started him on Lasix 20 mg and this seems to have helped with the edema. However we are not making substantial change with the left lateral calf wound and inflammation. The biopsy site on the right leg also looks stable but not really all that different. 03/12/17; the patient has been to see vein and vascular Dr. Lucky Cowboy. He has had venous reflux studies I have not reviewed these. I did get a call from his dermatology office. 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 They therefore recommended clobetasol and tetracycline i.e. 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 03/26/17; apparently the patient just had reflux studies not an appointment with Dr. dew. She arrives in clinic today having applied clobetasol for 2-3 weeks. He notes over the last 2-3 days excessive drainage having to change the dressing 3-4 times a day and also expanding erythema. 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 Lillia Carmel at [facility] dermatology locally who kindly saw him at my request. 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 My concern was the degree of erythema medially extending from his left leg wound which was either inflammation from  pyoderma or cellulitis. I put him on Augmentin however culture of the wound showed Pseudomonas which is quinolone sensitive. I really don't believe he has cellulitis however in view of everything I will continue and give him a course of Cipro. He is also on doxycycline as an immune modulator for the pyoderma. In addition to his original wound on the left lateral leg with surrounding erythema he has a wound on the right posterior calf which was an original biopsy site done by dermatology. 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 Saw Dr. Lillia Carmel yesterday. He has been using topical antibiotics to both wound areas his original wound on the left and the biopsies/pathergy area on the right. There is definitely some improvement in the inflammation around the wound on the right although the patient states he has increasing sensitivity of the wounds. He is on prednisone 60 and doxycycline 1 as prescribed by Dr. Lillia Carmel. He is covering the topical  antibiotic with gauze and putting this in his own compression stocks and changing this daily. He states that Dr. Lottie Rater did a culture of the left leg wound yesterday 05/07/17; pyoderma gangrenosum. The patient saw Dr. Lillia Carmel yesterday and has a follow-up with her in one month. He is still using topical antibiotics to both wounds although he can't recall exactly what type. He is still on prednisone 60 mg. Dr. Lillia Carmel stated that the doxycycline could stop if we were in agreement. 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 The right medial leg was induced by biopsy/pathergy. The area on the right is essentially healed. 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 The right medial leg has closed and  remains closed. He is still on prednisone 60. oHe tells me he missed his last dermatology appointment with Dr. Lillia Carmel but will make another appointment. He reports that her blood sugar at a recent screen in [place] was high 200's. He was 180 today. He is more cushingoid blood pressure is [name], [name] ([id number]) up a bit. I think he is going to require still much longer prednisone perhaps another 3 months before attempting to taper. In the meantime his wound is a lot better. Smaller. He is cleaning this off daily and applying topical antibiotics. When he was last in the clinic I thought about changing to [facility] and actually put in a couple of calls to dermatology although probably not during their business hours. 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 He continues on oral prednisone. He has been using triple antibiotic ointment. There is surface debris and we will transition to [facility] and have him return in 2 weeks. He has lost 30 pounds since his last appointment with lifestyle modification. He may benefit from topical steroid cream for treatment this can be considered at a later date. 08/22/17 on evaluation today patient appears to actually be doing rather well in regard to his left lateral lower extremity ulcer. He has actually been managed by Dr. Dellia Nims most recently. Patient is currently on oral steroids at this time. This seems to have been of benefit for him. Nonetheless his last visit was actually with Leah on 08/06/17. Currently he is not utilizing any topical steroid creams although this could be of benefit as well. No fevers, chills, nausea, or vomiting noted at this time. Electronic Signature(s) Signed: 08/22/2017 5:57:55 PM By: Worthy Keeler PA-C Entered By: Worthy Keeler on 08/22/2017 12:57:41 AYODELE, [name]  ([id number]) -------------------------------------------------------------------------------- Physical Exam Details Patient Name: [name] Date of Service: 08/22/2017 8:00 AM Medical Record Number: [id number] Patient Account Number: [id number] Date of Birth/Sex: [birth year] (36 y.o. M) Treating RN: Montey Hora Primary Care Provider: [name] Other Clinician: Referring Provider: [name] Treating Provider/Extender: Melburn Hake, Jamarri Vuncannon Weeks in Treatment: 67 Constitutional Well-nourished and well-hydrated in no acute distress. Respiratory normal breathing without difficulty. clear to auscultation bilaterally. Cardiovascular regular rate and rhythm with normal S1, S2. Psychiatric this patient is able to make decisions and demonstrates good insight into disease process. Alert and Oriented x 3. pleasant and cooperative. Notes Patient's wound does have some Slough noted on the surface of the wound. No debridement was performed as the patient  is concerned about the Pyoderma gangrenosum worsening with debridement. He has continue to utilize the [place] fortunately this does seem to be doing better. Electronic Signature(s) Signed: 08/22/2017 5:57:55 PM By: Worthy Keeler PA-C Entered By: Worthy Keeler on 08/22/2017 12:58:49 [name], [name] ([id number]) -------------------------------------------------------------------------------- Physician Orders Details Patient Name: [name] Date of Service: 08/22/2017 8:00 AM Medical Record Number: [id number] Patient Account Number: [id number] Date of Birth/Sex: [birth year] (36 y.o. M) Treating RN: Montey Hora Primary Care Provider: [name] Other Clinician: Referring Provider: [name] Treating Provider/Extender: Melburn Hake, Ronn Smolinsky Weeks in Treatment: 79 Verbal / Phone Orders: No Diagnosis Coding Wound Cleansing Wound #1 Left,Lateral Lower Leg o Clean wound with Normal Saline. Anesthetic (add to Medication  List) Wound #1 Left,Lateral Lower Leg o Topical Lidocaine 4% cream applied to wound bed prior to debridement (In Clinic Only). Primary Wound Dressing Wound #1 Left,Lateral Lower Leg o Santyl Ointment Secondary Dressing Wound #1 Left,Lateral Lower Leg o Boardered Foam Dressing Dressing Change Frequency Wound #1 Left,Lateral Lower Leg o Change dressing every day. Follow-up Appointments Wound #1 Left,Lateral Lower Leg o Return Appointment in 2 weeks. Edema Control Wound #1 Left,Lateral Lower Leg o Patient to wear own compression stockings Electronic Signature(s) Signed: 08/22/2017 3:34:53 PM By: Montey Hora Signed: 08/22/2017 5:57:55 PM By: Worthy Keeler PA-C Entered By: Montey Hora on 08/22/2017 08:21:51 [name], [name] ([id number]) -------------------------------------------------------------------------------- Problem List Details Patient Name: [name] Date of Service: 08/22/2017 8:00 AM Medical Record Number: [id number] Patient Account Number: [id number] Date of Birth/Sex: 1980-08-23 (36 y.o. 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 12/04/2016 Yes L88 Pyoderma gangrenosum 03/26/2017 Yes I87.2 Venous insufficiency (chronic) (peripheral) 12/04/2016 Yes Inactive Problems ICD-10 Code Description Active Date Inactive Date L97.213 Non-pressure chronic ulcer of right calf with necrosis of muscle 04/02/2017 04/02/2017 Resolved Problems Electronic Signature(s) Signed: 08/22/2017 5:57:55 PM By: Worthy Keeler PA-C Entered By: Worthy Keeler on 08/22/2017 09:17:28 [name]  ([id number]) -------------------------------------------------------------------------------- Progress Note Details Patient Name: [name] Date of Service: 08/22/2017 8:00 AM Medical Record Number: [id number] Patient Account Number: [id number] Date of Birth/Sex: 11/03/1980 (36 y.o. M) Treating RN: Montey Hora Primary Care Provider: [name] Other Clinician: Referring Provider: [name] Treating Provider/Extender: Melburn Hake, Yanice Maqueda Weeks in Treatment: 48 Subjective Chief Complaint Information obtained from Patient He is here in follow up evaluation for LLE pyoderma ulcer History of Present Illness (HPI) 12/04/16; 37 year old man who comes into the clinic today for review of a wound on the posterior left calf. He tells me that is been there for about a year. He is not a diabetic he does smoke half a pack per day. He was seen in the ER on 11/20/16 felt to have cellulitis around the wound and was given clindamycin. An x-ray did not show osteomyelitis. The patient initially tells me that he has a milk allergy that sets off a pruritic itching rash on his lower legs which she scratches incessantly and he thinks that's what may have set up the wound. He has been using various topical antibiotics and ointments without any effect. He works in a [employer]ing [employer] and is on his feet all day. He does not have a prior history of wounds however he does have the rash on both lower legs the right arm  and the ventral aspect of his left arm. These are excoriations and clearly have had scratching however there are of macular looking areas on both legs including a substantial larger area on the right leg. This does not have an underlying open area. There is no blistering. The patient tells me that 2 years ago in [place] in response to the rash on his legs he saw a dermatologist who told him he had a condition which may be pyoderma gangrenosum although I may be putting words into his mouth. He seemed to  recognize this. 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 He is not in any treatment for this. He's had no recent travel 12/11/16; the patient arrives today with his wound and roughly the same condition we've been using silver alginate this is a deep punched out wound with some surrounding erythema but no tenderness. Biopsy I did did not show confirmed pyoderma gangrenosum suggested nonspecific inflammation and vasculitis but does not provide an actual description of what was seen by the pathologist. I'm really not able to understand this We have also received information from the patient's dermatologist in [place] notes from April 2016. This was a doctor Agarwal- antal. 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 He was prescribed topical steroid high potency under occlusion which helped but at this point the patient did not have a deep punched out wound. 12/18/16; the patient's wound is larger in terms of surface area however this surface looks better and there is less depth. The surrounding erythema also is better. The patient states that the wrap we put on came off 2 days ago when he has been using his compression stockings. He we are in the process of getting a dermatology consult. 12/26/16 on evaluation today patient's left lower extremity wound shows evidence of infection with surrounding erythema noted. He has been tolerating the dressing changes but states that he has noted more discomfort. There is a larger area of erythema surrounding the wound. No fevers, chills, nausea, or vomiting noted at this time. With that being said the wound still does have slough covering the surface. He is not allergic to any medication that he is aware of at this point. In regard to his right lower extremity he had several regions that are erythematous and pruritic he wonders if there's anything we can do to help that. 01/02/17 I reviewed patient's wound  culture which was obtained his visit last week. He was placed on doxycycline at that point. Unfortunately that does not appear to be an antibiotic that would likely help with the situation however the pseudomonas noted on culture is sensitive to Cipro. Also unfortunately patient's wound seems to have a large compared to last week's evaluation. Not severely so but there are definitely increased measurements in general. He is continuing to have discomfort as well he writes this to be a seven out of 10. In fact he would prefer me not to perform any debridement today due to the fact that he is having discomfort and considering he has an active infection on the little reluctant to do so anyway. No fevers, [name], [name] ([id number]) chills, nausea, or vomiting noted at this time. 01/08/17; patient seems dermatology on September 5. I suspect dermatology will want the slides from the biopsy I did sent to their pathologist. I'm not sure if there is a way we can expedite that. In any case the culture I did before I left on vacation 3  weeks ago showed Pseudomonas he was given 10 days of Cipro and per her description of her intake nurses is actually somewhat better this week although the wound is quite a bit bigger than I remember the last time I saw this. He still has 3 more days of Cipro 01/21/17; dermatology appointment tomorrow. He has completed the ciprofloxacin for Pseudomonas. Surface of the wound looks better however he is had some deterioration in the lesions on his right leg. Meantime the left lateral leg wound we will continue with sample 01/29/17; patient had his dermatology appointment but I can't yet see that note. He is completed his antibiotics. The wound is more superficial but considerably larger in circumferential area than when he came in. This is in his left lateral calf. He also has swollen erythematous areas with superficial wounds on the right leg and small papular areas on both arms.  There apparently areas in her his upper thighs and buttocks I did not look at those. Dermatology biopsied the right leg. Hopefully will have their input next week. 02/05/17; patient went back to see his dermatologist who told him that he had a "scratching problem" as well as staph. He is now on a 30 day course of doxycycline and I believe she gave him triamcinolone cream to the right leg areas to help with the itching [not exactly sure but probably triamcinolone]. She apparently looked at the left lateral leg wound although this was not rebiopsied and I think felt to be ultimately part of the same pathogenesis. He is using sample border foam and changing nevus himself. He now has a new open area on the right posterior leg which was his biopsy site I don't have any of the dermatology notes 02/12/17; we put the patient in compression last week with SANTYL to the wound on the left leg and the biopsy. Edema is much better and the depth of the wound is now at level of skin. Area is still the same Biopsy site on the right lateral leg we've also been using santyl with a border foam dressing and he is changing this himself. 02/19/17; Using silver alginate started last week to both the substantial left leg wound and the biopsy site on the right wound. He is tolerating compression well. Has a an appointment with his primary M.D. tomorrow wondering about diuretics although I'm wondering if the edema problem is actually lymphedema 02/26/17; the patient has been to see his primary doctor Dr. Jerrel Ivory at [facility]. She started him on Lasix 20 mg and this seems to have helped with the edema. However we are not making substantial change with the left lateral calf wound and inflammation. The biopsy site on the right leg also looks stable but not really all that different. 03/12/17; the patient has been to see vein and vascular Dr. Lucky Cowboy. He has had venous reflux studies I have not reviewed these. I  did get a call from his dermatology office. 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 They therefore recommended clobetasol and tetracycline i.e. 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 03/26/17; apparently the patient just had reflux studies not an appointment with Dr. dew. She arrives in clinic today having applied clobetasol  for 2-3 weeks. He notes over the last 2-3 days excessive drainage having to change the dressing 3-4 times a day and also expanding erythema. 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 Lillia Carmel at [facility] dermatology locally who kindly saw him at my request. 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 My concern was the degree of erythema medially extending from his left leg wound which was either inflammation from pyoderma or cellulitis. I put him on Augmentin however culture of the wound showed Pseudomonas which is quinolone sensitive. I really don't believe he has cellulitis however in view of everything I will continue and give him a course of Cipro. He is also on doxycycline as an immune modulator for the pyoderma. In addition to his original wound on the left lateral leg with surrounding erythema he has a wound on the right posterior calf which was an original biopsy site done by dermatology. This was felt to represent pathergy from pyoderma gangrenosum 04/16/17; pyoderma  gangrenosum. Saw Dr. Lillia Carmel yesterday. He has been using topical antibiotics to both wound areas his original wound on the left and the biopsies/pathergy area on the right. There is definitely some improvement in the inflammation around the wound on the right although the patient states he has increasing sensitivity of the wounds. He is on prednisone 60 and doxycycline 1 as prescribed by Dr. Lillia Carmel. He is covering the topical antibiotic with gauze and putting this in his own compression stocks and changing this daily. He states that Dr. Lottie Rater did a culture of the left leg wound yesterday 05/07/17; pyoderma gangrenosum. The patient saw Dr. Lillia Carmel yesterday and has a follow-up with her in one month. He is still using topical antibiotics to both wounds although he can't recall exactly what type. He is still on prednisone 60 mg. Dr. Lillia Carmel stated that the doxycycline could stop if we were in agreement. 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 The right medial leg was induced by [name] ([id number]) biopsy/pathergy. The area on the right is essentially healed. 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 The right medial leg has closed and remains closed. He is still on prednisone 60. He tells me he missed his last dermatology appointment with Dr. Lillia Carmel but will make another appointment. He reports that her blood sugar at a recent screen in [place] was high 200's. He was 180 today. 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 I think he is going to require still much longer prednisone perhaps another 3 months before attempting to taper. In the meantime his wound is a lot better. Smaller. He is cleaning this off daily and applying topical antibiotics. When he was last  in the clinic I thought about changing to [facility] and actually put in a couple of calls to dermatology although probably not during their business hours. In any case the wound looks better smaller I don't think there is any need to change what he is doing 08/06/17-he is here in follow up evaluation for pyoderma left leg  ulcer. He continues on oral prednisone. He has been using triple antibiotic ointment. There is surface debris and we will transition to [facility] and have him return in 2 weeks. He has lost 30 pounds since his last appointment with lifestyle modification. He may benefit from topical steroid cream for treatment this can be considered at a later date. 08/22/17 on evaluation today patient appears to actually be doing rather well in regard to his left lateral lower extremity ulcer. He has actually been managed by Dr. Dellia Nims most recently. Patient is currently on oral steroids at this time. This seems to have been of benefit for him. Nonetheless his last visit was actually with Leah on 08/06/17. Currently he is not utilizing any topical steroid creams although this could be of benefit as well. No fevers, chills, nausea, or vomiting noted at this time. Patient History Information obtained from Patient. Family History Diabetes - Mother,Maternal Grandparents, Heart Disease - Mother,Maternal Grandparents, Hereditary Spherocytosis - Siblings, Hypertension - Maternal Grandparents, Stroke - Siblings, No family history of Cancer, Kidney Disease, Lung Disease, Seizures, Thyroid Problems, Tuberculosis. Social History Current some day smoker, Marital Status - Married, Alcohol Use - Rarely, Drug Use - No History, Caffeine Use - Moderate. Review of Systems (ROS) Constitutional Symptoms (General Health) Denies complaints or symptoms of Fever, Chills. Respiratory The patient has no complaints or symptoms. Cardiovascular The patient has no complaints or symptoms. Psychiatric The patient has no  complaints or symptoms. Objective Constitutional Well-nourished and well-hydrated in no acute distress. Vitals Time Taken: 8:04 AM, Height: 71 in, Weight: 338 lbs, BMI: 47.1, Temperature: 98.6 F, Pulse: 77 bpm, Respiratory Rate: 18 breaths/min, Blood Pressure: 153/87 mmHg. [name], [name] ([id number]) Respiratory normal breathing without difficulty. clear to auscultation bilaterally. Cardiovascular regular rate and rhythm with normal S1, S2. Psychiatric this patient is able to make decisions and demonstrates good insight into disease process. Alert and Oriented x 3. pleasant and cooperative. General Notes: Patient's wound does have some Slough noted on the surface of the wound. No debridement was performed as the patient is concerned about the Pyoderma gangrenosum worsening with debridement. He has continue to utilize the [place] fortunately this does seem to be doing better. Integumentary (Hair, Skin) Wound #1 status is Open. Original cause of wound was Gradually Appeared. The wound is located on the Left,Lateral Lower Leg. The wound measures 3.6cm length x 2.5cm width x 0.2cm depth; 7.069cm^2 area and 1.414cm^3 volume. There is Fat Layer (Subcutaneous Tissue) Exposed exposed. There is no tunneling or undermining noted. There is a medium amount of serosanguineous drainage noted. The wound margin is distinct with the outline attached to the wound base. There is medium (34-66%) red granulation within the wound bed. There is a medium (34-66%) amount of necrotic tissue within the wound bed including Adherent Slough. The periwound skin appearance exhibited: Induration, Erythema. The periwound skin appearance did not exhibit: Callus, Crepitus, Excoriation, Rash, Scarring, Dry/Scaly, Maceration, Atrophie Blanche, Cyanosis, Ecchymosis, Hemosiderin Staining, Mottled, Pallor, Rubor. The surrounding wound skin color is noted with erythema which is circumferential. Periwound temperature was noted as No  Abnormality. The periwound has tenderness on palpation. 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 Anesthetic (add to Medication List): Wound #1 Left,Lateral Lower Leg: Topical Lidocaine 4% cream applied to wound bed prior to debridement (In Clinic Only). Primary Wound Dressing:  Wound #1 Left,Lateral Lower Leg: Santyl Ointment Secondary Dressing: Wound #1 Left,Lateral Lower Leg: Boardered Foam Dressing Dressing Change Frequency: Taulbee, Xavi ([id number]) Wound #1 Left,Lateral Lower Leg: Change dressing every day. Follow-up Appointments: Wound #1 Left,Lateral Lower Leg: Return Appointment in 2 weeks. Edema Control: Wound #1 Left,Lateral Lower Leg: Patient to wear own compression stockings I am going to suggest at this point that we continue with the current wound care measures for this time. This is the best that he has looked for several weeks which is good news. Hopefully he will continue to make good progress. Please see above for specific wound care orders. We will see patient for re-evaluation in 1 week(s) here in the clinic. If anything worsens or changes patient will contact our office for additional recommendations. Electronic Signature(s) Signed: 08/22/2017 5:57:55 PM By: Worthy Keeler PA-C Entered By: Worthy Keeler on 08/22/2017 12:59:12 [name], [name] ([id number]) -------------------------------------------------------------------------------- ROS/PFSH Details Patient Name: [name] Date of Service: 08/22/2017 8:00 AM Medical Record Number: [id number] Patient Account Number: [id number] Date of Birth/Sex: 13-Jul-1980 (36 y.o. M) Treating RN: Montey Hora Primary Care Provider: [name] Other Clinician: Referring Provider:  [name] Treating Provider/Extender: Melburn Hake, Joseff Luckman Weeks in Treatment: 10 Information Obtained From Patient Wound History Do you currently have one or more open woundso Yes How many open wounds do you currently haveo 1 Approximately how long have you had your woundso 1 year How have you been treating your wound(s) until nowo neosporin Has your wound(s) ever healed and then re-openedo No Have you had any lab work done in the past montho No Have you tested positive for an antibiotic resistant organism (MRSA, VRE)o No Have you tested positive for osteomyelitis (bone infection)o No Have you had any tests for circulation on your legso No Have you had other problems associated with your woundso Infection, Swelling Constitutional Symptoms (General Health) Complaints and Symptoms: Negative for: Fever; Chills Eyes Medical History: Negative for: Cataracts; Glaucoma; Optic Neuritis Ear/Nose/Mouth/Throat Medical History: Negative for: Chronic sinus problems/congestion; Middle ear problems Hematologic/Lymphatic Medical History: Negative for: Anemia; Hemophilia; Human Immunodeficiency Virus; Lymphedema; Sickle Cell Disease Respiratory Complaints and Symptoms: No Complaints or Symptoms Medical History: Positive for: Sleep Apnea - cpap Negative for: Aspiration; Asthma; Chronic Obstructive Pulmonary Disease (COPD); Pneumothorax Cardiovascular Complaints and Symptoms: No Complaints or Symptoms Medical History: Positive for: Hypertension Peake, Corrigan ([id number]) Negative for: Angina; Arrhythmia; Congestive Heart Failure; Coronary Artery Disease; Deep Vein Thrombosis; Hypotension; Myocardial Infarction; Peripheral Arterial Disease; Peripheral Venous Disease; Phlebitis; Vasculitis Gastrointestinal Medical History: Positive for: Colitis Endocrine Medical History: Negative for: Type I Diabetes; Type II Diabetes Genitourinary Medical History: Negative for: End Stage Renal  Disease Immunological Medical History: Negative for: Lupus Erythematosus; Raynaudos; Scleroderma Integumentary (Skin) Medical History: Negative for: History of Burn; History of pressure wounds Musculoskeletal Medical History: Negative for: Gout; Rheumatoid Arthritis; Osteoarthritis; Osteomyelitis Neurologic Medical History: Negative for: Dementia; Neuropathy; Quadriplegia; Paraplegia; Seizure Disorder Oncologic Medical History: Negative for: Received Chemotherapy; Received Radiation Psychiatric Complaints and Symptoms: No Complaints or Symptoms Medical History: Negative for: Anorexia/bulimia; Confinement Anxiety Immunizations Pneumococcal Vaccine: Received Pneumococcal Vaccination: No Implantable Devices Family and Social History ADONI, GREENOUGH ([id number]) Cancer: No; Diabetes: Yes - Mother,Maternal Grandparents; Heart Disease: Yes - Mother,Maternal Grandparents; Hereditary Spherocytosis: Yes - Siblings; Hypertension: Yes - Maternal Grandparents; Kidney Disease: No; Lung Disease: No; Seizures: No; Stroke: Yes - Siblings; Thyroid Problems: No; Tuberculosis: No; Current some day smoker; Marital Status - Married; Alcohol Use: Rarely; Drug Use: No History; Caffeine Use: Moderate; Financial Concerns: No;  Food, Games developer or Shelter Needs: No; Support System Lacking: No; Transportation Concerns: No; Advanced Directives: No; Patient does not want information on Advanced Directives; Living Will: No Physician Affirmation I have reviewed and agree with the above information. Electronic Signature(s) Signed: 08/22/2017 3:34:53 PM By: Montey Hora Signed: 08/22/2017 5:57:55 PM By: Worthy Keeler PA-C Entered By: Worthy Keeler on 08/22/2017 12:58:10 [name] ([id number]) -------------------------------------------------------------------------------- SuperBill Details Patient Name: [name] Date of Service: 08/22/2017 Medical Record Number: [id number] Patient Account Number:  [id number] Date of Birth/Sex: 1980-12-29 (36 y.o. M) Treating RN: Montey Hora Primary Care Provider: [name] Other Clinician: Referring Provider: [name] Treating Provider/Extender: Melburn Hake, Antwann Preziosi Weeks in Treatment: 37 Diagnosis Coding ICD-10 Codes Code Description [id number] 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sis of muscle L88 Pyoderma gangrenosum I87.2 Venous insufficiency (chronic) (peripheral) Facility Procedures CPT4 Code: [id number] Description: 42706 - WOUND CARE VISIT-LEV 3 EST PT Modifier: Quantity: 1 Physician Procedures CPT4 Code: [id number] Description: 15176 - WC PHYS LEVEL 3 - EST PT ICD-10 Diagnosis Description L97.223 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sis of m L88 Pyoderma gangrenosum I87.2 Venous insufficiency (chronic) (peripheral) Modifier: uscle Quantity: 1 Electronic Signature(s) Signed: 08/22/2017 5:57:55 PM By: Worthy Keeler PA-C Entered By: Worthy Keeler on 08/22/2017 12:59:32

## 2017-08-26 NOTE — Progress Notes (Signed)
KURK, CORNIEL ([id number]) Visit Report for 08/22/2017 Arrival Information Details Patient Name: [name], [name] Date of Service: 08/22/2017 8:00 AM Medical Record Number: [id number] Patient Account Number: [id number] Date of Birth/Sex: 07/22/1980 (37 y.o. M) Treating RN: Roger Shelter Primary Care Aries Kasa: [name] Other Clinician: Referring Evana Runnels: [name] Treating Abeni Finchum/Extender: Melburn Hake, [name]: 78 Visit Information History Since Last Visit All ordered tests and consults were completed: No Patient Arrived: Ambulatory Added or deleted any medications: No Arrival Time: 08:03 Any new allergies or adverse reactions: No Accompanied By: self Had a fall or experienced change in No Transfer Assistance: None activities of daily living that may affect Patient Identification Verified: Yes risk of falls: Secondary Verification Process Completed: Yes Signs or symptoms of abuse/neglect since last visito No Patient Requires Transmission-Based No Hospitalized since last visit: No Precautions: Implantable device outside of the clinic excluding No Patient Has Alerts: No cellular tissue based products placed in the center since last visit: Pain Present Now: No Electronic Signature(s) Signed: 08/22/2017 10:00:44 AM By: Roger Shelter Entered By: Roger Shelter on 08/22/2017 08:15:41 [name] ([id number]) -------------------------------------------------------------------------------- Clinic Level of Care Assessment Details Patient Name: [name] Date of Service: 08/22/2017 8:00 AM Medical Record Number: [id number] Patient Account Number: [id number] Date of Birth/Sex: 02/15/1981 (36 y.o. M) Treating RN: Montey Hora Primary Care Marwah Disbro: [name] Other Clinician: Referring Janelie Goltz: [name] Treating Akiva Josey/Extender: Melburn Hake, [name]: 37 Clinic Level of Care Assessment Items TOOL 4 Quantity Score []  -  Use when only an EandM is performed on FOLLOW-UP visit 0 ASSESSMENTS - Nursing Assessment / Reassessment X - Reassessment of Co-morbidities (includes updates in patient status) 1 10 X- 1 5 Reassessment of Adherence to Treatment Plan ASSESSMENTS - Wound and Skin Assessment / Reassessment X - Simple Wound Assessment / Reassessment - one wound 1 5 []  - 0 Complex Wound Assessment / Reassessment - multiple wounds []  - 0 Dermatologic / Skin Assessment (not related to wound area) ASSESSMENTS - Focused Assessment []  - Circumferential Edema Measurements - multi extremities 0 []  - 0 Nutritional Assessment / Counseling / Intervention X- 1 5 Lower Extremity Assessment (monofilament, tuning fork, pulses) []  - 0 Peripheral Arterial Disease Assessment (using hand held doppler) ASSESSMENTS - Ostomy and/or Continence Assessment and Care []  - Incontinence Assessment and Management 0 []  - 0 Ostomy Care Assessment and Management (repouching, etc.) PROCESS - Coordination of Care X - Simple Patient / Family Education for ongoing care 1 15 []  - 0 Complex (extensive) Patient / Family Education for ongoing care []  - 0 Staff obtains Programmer, systems, Records, Test Results / Process Orders []  - 0 Staff telephones HHA, Nursing Homes / Clarify orders / etc []  - 0 Routine Transfer to another Facility (non-emergent condition) []  - 0 Routine Hospital Admission (non-emergent condition) []  - 0 New Admissions / Biomedical engineer / Ordering NPWT, Apligraf, etc. []  - 0 Emergency Hospital Admission (emergent condition) X- 1 10 Simple Discharge Coordination [name], [name] ([id number]) []  - 0 Complex (extensive) Discharge Coordination PROCESS - Special Needs []  - Pediatric / Minor Patient Management 0 []  - 0 Isolation Patient Management []  - 0 Hearing / Language / Visual special needs []  - 0 Assessment of Community assistance (transportation, D/C planning, etc.) []  - 0 Additional assistance / Altered  mentation []  - 0 Support Surface(s) Assessment (bed, cushion, seat, etc.) INTERVENTIONS - Wound Cleansing / Measurement X - Simple Wound Cleansing - one wound 1 5 []  - 0 Complex Wound Cleansing - multiple wounds  X- 1 5 Wound Imaging (photographs - any number of wounds) []  - 0 Wound Tracing (instead of photographs) X- 1 5 Simple Wound Measurement - one wound []  - 0 Complex Wound Measurement - multiple wounds INTERVENTIONS - Wound Dressings X - Small Wound Dressing one or multiple wounds 1 10 []  - 0 Medium Wound Dressing one or multiple wounds []  - 0 Large Wound Dressing one or multiple wounds X- 1 5 Application of Medications - topical []  - 0 Application of Medications - injection INTERVENTIONS - Miscellaneous []  - External ear exam 0 []  - 0 Specimen Collection (cultures, biopsies, blood, body fluids, etc.) []  - 0 Specimen(s) / Culture(s) sent or taken to Lab for analysis []  - 0 Patient Transfer (multiple staff / Civil Service fast streamer / Similar devices) []  - 0 Simple Staple / Suture removal (25 or less) []  - 0 Complex Staple / Suture removal (26 or more) []  - 0 Hypo / Hyperglycemic Management (close monitor of Blood Glucose) []  - 0 Ankle / Brachial Index (ABI) - do not check if billed separately X- 1 5 Vital Signs [name], [name] ([id number]) Has the patient been seen at the hospital within the last three years: Yes Total Score: 85 Level Of Care: New/Established - Level 3 Electronic Signature(s) Signed: 08/22/2017 3:34:53 PM By: Montey Hora Entered By: Montey Hora on 08/22/2017 08:22:37 [name] ([id number]) -------------------------------------------------------------------------------- Encounter Discharge Information Details Patient Name: [name] Date of Service: 08/22/2017 8:00 AM Medical Record Number: [id number] Patient Account Number: [id number] Date of Birth/Sex: June 30, 1980 (36 y.o. M) Treating RN: Ahmed Prima Primary Care Quyen Cutsforth: [name]  Other Clinician: Referring Cornella Emmer: [name] Treating Sybel Standish/Extender: Melburn Hake, [name]: 72 Encounter Discharge Information Items Discharge Pain Level: 0 Discharge Condition: Stable Ambulatory Status: Ambulatory Discharge Destination: Home Transportation: Private Auto Accompanied By: self Schedule Follow-up Appointment: Yes Medication Reconciliation completed and No provided to Patient/Care Adaisha Campise: Patient Clinical Summary of Care: Declined Electronic Signature(s) Signed: 08/25/2017 10:47:07 AM By: Ruthine Dose Entered By: Ruthine Dose on 08/22/2017 08:33:36 [name], [name] ([id number]) -------------------------------------------------------------------------------- Lower Extremity Assessment Details Patient Name: [name] Date of Service: 08/22/2017 8:00 AM Medical Record Number: [id number] Patient Account Number: [id number] Date of Birth/Sex: Oct 23, 1980 (36 y.o. M) Treating RN: Roger Shelter Primary Care Johnmark Geiger: [name] Other Clinician: Referring Windle Huebert: [name] Treating Laurana Magistro/Extender: Melburn Hake, [name]: 37 Edema Assessment Assessed: [Left: No] [Right: No] Edema: [Left: Ye] [Right: s] Calf Left: Right: Point of Measurement: 33 cm From Medial Instep 49.5 cm cm Ankle Left: Right: Point of Measurement: 11 cm From Medial Instep 30 cm cm Vascular Assessment Claudication: Claudication Assessment [Left:None] Pulses: Dorsalis Pedis Palpable: [Left:Yes] Posterior Tibial Extremity colors, hair growth, and conditions: Extremity Color: [Left:Hyperpigmented] Hair Growth on Extremity: [Left:Yes] Temperature of Extremity: [Left:Warm] Capillary Refill: [Left:< 3 seconds] Toe Nail Assessment Left: Right: Thick: No Discolored: No Deformed: No Improper Length and Hygiene: No Electronic Signature(s) Signed: 08/22/2017 10:00:44 AM By: Roger Shelter Entered By: Roger Shelter on 08/22/2017  08:11:32 [name] ([id number]) -------------------------------------------------------------------------------- Multi Wound Chart Details Patient Name: [name] Date of Service: 08/22/2017 8:00 AM Medical Record Number: [id number] Patient Account Number: [id number] Date of Birth/Sex: [birth year] (36 y.o. 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] [N/A:N/A] Comorbid History: [1:Sleep Apnea, Hypertension, Colitis] [N/A:N/A] Date Acquired: [1:11/18/2015] [N/A:N/A] Weeks of Treatment: [1:37] [N/A:N/A] Wound Status: [1:Open] [N/A:N/A] Measurements L x W x D [1:3.6x2.5x0.2] [N/A:N/A] (cm) Area (cm) : [1:7.069] [N/A:N/A] Volume (cm) : [1:1.414] [N/A:N/A] % Reduction in Area: [1:-44.00%] [N/A:N/A] % Reduction in Volume: [1:64.00%] [N/A:N/A] Classification: [1:Full Thickness Without Exposed Support Structures] [N/A:N/A] Exudate Amount: [1:Medium] [N/A:N/A] Exudate Type: [1:Serosanguineous] [N/A:N/A] Exudate Color: [1:red, brown] [N/A:N/A] Wound Margin: [1:Distinct, outline attached] [N/A:N/A] Granulation Amount: [1:Medium (34-66%)] [N/A:N/A] Granulation Quality: [1:Red] [N/A:N/A] Necrotic Amount: [1:Medium (34-66%)] [N/A:N/A] Exposed Structures: [1:Fat Layer (Subcutaneous Tissue) Exposed: Yes Fascia: No Tendon: No Muscle: No Joint: No Bone: No] [N/A:N/A] Epithelialization: [1:None] [N/A:N/A] Periwound Skin Texture: [1:Induration: Yes Excoriation: No Callus: No Crepitus: No] [N/A:N/A] Rash:  No Scarring: No Periwound Skin Moisture: Maceration: No N/A N/A Dry/Scaly: No Periwound Skin Color: Erythema: Yes N/A N/A Atrophie Blanche: No Cyanosis: No Ecchymosis: No Hemosiderin Staining: No Mottled: No Pallor: No Rubor: No Erythema Location: Circumferential N/A N/A Erythema Change: Increased N/A N/A Temperature: No Abnormality N/A N/A Tenderness on Palpation: Yes N/A N/A Wound Preparation: Ulcer Cleansing: N/A N/A Rinsed/Irrigated with Saline Topical Anesthetic Applied: None, Other: Lidocaine 4% Treatment Notes Electronic Signature(s) Signed: 08/22/2017 3:34:53 PM By: Montey Hora Entered By: Montey Hora on 08/22/2017 08:19:54 [name] ([id number]) -------------------------------------------------------------------------------- Multi-Disciplinary Care Plan Details Patient Name: [name] Date of Service: 08/22/2017 8:00 AM Medical Record Number: [id number] Patient Account Number: [id number] Date of Birth/Sex: 16-Apr-1981 (36 y.o. M) Treating RN: Montey Hora Primary Care Ahmeer Tuman: [name] Other Clinician: Referring Wajiha Versteeg: [name] Treating Troyce Gieske/Extender: Melburn Hake, [name]: 37 Active Inactive ` Orientation to the Wound Care Program Nursing Diagnoses: Knowledge deficit related to the wound healing center program Goals: Patient/caregiver will verbalize understanding of the [facility] Date Initiated: 12/11/2016 Target Resolution Date: 03/13/2017 Goal Status: Active Interventions: Provide education on orientation to the wound center Notes: ` Venous Leg Ulcer Nursing Diagnoses: Knowledge deficit related to disease process and management Potential for venous Insuffiency (use before diagnosis confirmed) Goals: Non-invasive venous studies are completed as ordered Date Initiated: 12/11/2016 Target Resolution Date: 03/13/2017 Goal Status: Active Patient will maintain optimal edema control Date  Initiated: 12/11/2016 Target Resolution Date: 03/13/2017 Goal Status: Active Patient/caregiver will verbalize understanding of disease process and disease management Date Initiated: 12/11/2016 Target Resolution Date: 03/13/2017 Goal Status: Active Verify adequate tissue perfusion prior to therapeutic compression application Date Initiated: 12/11/2016 Target Resolution Date: 03/13/2017 Goal Status: Active Interventions: Assess peripheral edema status every visit. Compression as ordered Provide education on venous insufficiency Treatment Activities: [name], [name] ([id number]) Therapeutic compression applied : 12/11/2016 Notes: ` Wound/Skin Impairment Nursing Diagnoses: Impaired tissue integrity Knowledge deficit related to ulceration/compromised skin integrity Goals: Patient/caregiver will verbalize understanding of skin care regimen Date Initiated: 12/11/2016 Target Resolution Date: 03/13/2017 Goal Status: Active Ulcer/skin breakdown will have a volume reduction of 30% by week 4 Date Initiated: 12/11/2016 Target Resolution Date: 03/13/2017 Goal Status: Active Ulcer/skin breakdown will have a volume reduction of 50% by week 8 Date Initiated: 12/11/2016 Target Resolution Date: 03/13/2017 Goal Status: Active Ulcer/skin breakdown will have a volume reduction of 80% by week 12 Date Initiated: 12/11/2016 Target Resolution Date: 03/13/2017 Goal Status: Active Ulcer/skin breakdown will heal within 14 weeks Date Initiated: 12/11/2016 Target Resolution Date: 03/13/2017 Goal Status: Active Interventions: Assess patient/caregiver ability to obtain necessary supplies Assess patient/caregiver ability to perform ulcer/skin care regimen upon admission and as needed Assess ulceration(s) every  visit Provide education on ulcer and skin care Treatment Activities: Skin care regimen initiated : 12/11/2016 Topical wound management initiated : 12/11/2016 Notes: Electronic Signature(s) Signed:  08/22/2017 3:34:53 PM By: Montey Hora Entered By: Montey Hora on 08/22/2017 08:19:30 [name] ([id number]) -------------------------------------------------------------------------------- Pain Assessment Details Patient Name: [name] Date of Service: 08/22/2017 8:00 AM Medical Record Number: [id number] Patient Account Number: [id number] Date of Birth/Sex: 1981/01/18 (36 y.o. M) Treating RN: Roger Shelter Primary Care Esha Fincher: [name] Other Clinician: Referring Avien Taha: [name] Treating Yanilen Adamik/Extender: Melburn Hake, [name]: 73 Active Problems Location of Pain Severity and Description of Pain Patient Has Paino No Site Locations Pain Management and Medication Current Pain Management: Electronic Signature(s) Signed: 08/22/2017 10:00:44 AM By: Roger Shelter Entered By: Roger Shelter on 08/22/2017 08:04:57 [name] ([id number]) -------------------------------------------------------------------------------- Patient/Caregiver Education Details Patient Name: [name] Date of Service: 08/22/2017 8:00 AM Medical Record Number: [id number] Patient Account Number: [id number] Date of Birth/Gender: 1981-04-25 (36 y.o. M) Treating RN: Ahmed Prima Primary Care Physician: [name] Other Clinician: Referring Physician: [name] Treating Physician/Extender: Sharalyn Ink in Treatment: 34 Education Assessment Education Provided To: Patient Education Topics Provided Wound/Skin Impairment: Handouts: Caring for Your Ulcer, Other: change dressing as ordered Methods: Demonstration, Explain/Verbal Responses: State content correctly Electronic Signature(s) Signed: 08/25/2017 4:32:12 PM By: Alric Quan Entered By: Alric Quan on 08/22/2017 08:31:06 [name], [name] ([id number]) -------------------------------------------------------------------------------- Wound Assessment Details Patient Name: [name] Date of Service: 08/22/2017 8:00 AM Medical Record Number: [id number] Patient Account Number: [id number] Date of Birth/Sex: January 02, 1981 (36 y.o. M) Treating RN: Roger Shelter Primary Care Gia Lusher: [name] Other Clinician: Referring Jerika Wales: [name] Treating Maxemiliano Riel/Extender: Melburn Hake, [name]: 37 Wound Status Wound Number: 1 Primary Etiology: Pyoderma Wound Location: Left Lower Leg - Lateral Wound Status: Open Wounding Event: Gradually Appeared Comorbid History: Sleep Apnea, Hypertension, Colitis Date Acquired: 11/18/2015 Weeks Of Treatment: 37 Clustered Wound: No Photos Photo Uploaded By: Roger Shelter on 08/22/2017 09:58:43 Wound Measurements Length: (cm) 3.6 Width: (cm) 2.5 Depth: (cm) 0.2 Area: (cm) 7.069 Volume: (cm) 1.414 % Reduction in Area: -44% % Reduction in Volume: 64% Epithelialization: None Tunneling: No Undermining: No Wound Description Full Thickness Without Exposed Support Classification: Structures Wound Margin: Distinct, outline attached Exudate Medium Amount: Exudate Type: Serosanguineous Exudate Color: red, brown Foul Odor After Cleansing: No Slough/Fibrino Yes Wound Bed Granulation Amount: Medium (34-66%) Exposed Structure Granulation Quality: Red Fascia Exposed: No Necrotic Amount: Medium (34-66%) Fat Layer (Subcutaneous Tissue) Exposed: Yes Necrotic Quality: Adherent Slough Tendon Exposed: No Muscle Exposed: No Joint Exposed: No Bone Exposed: No [name], [name] ([id number]) Periwound Skin Texture Texture Color No Abnormalities Noted: No No Abnormalities Noted: No Callus: No Atrophie Blanche: No Crepitus: No Cyanosis: No Excoriation: No Ecchymosis: No Induration: Yes Erythema: Yes Rash: No Erythema Location: Circumferential Scarring: No Erythema Change: Increased Hemosiderin Staining: No Moisture Mottled: No No Abnormalities Noted: No Pallor: No Dry / Scaly: No Rubor:  No Maceration: No Temperature / Pain Temperature: No Abnormality Tenderness on Palpation: Yes Wound Preparation Ulcer Cleansing: Rinsed/Irrigated with Saline Topical Anesthetic Applied: None, Other: Lidocaine 4%, Treatment Notes Wound #1 (Left, Lateral Lower Leg) 1. Cleansed with: Clean wound with Normal Saline 2. Anesthetic Topical Lidocaine 4% cream to wound bed prior to debridement 3. Peri-wound Care: Skin Prep Other peri-wound care (specify in notes) 4. Dressing Applied: Santyl Ointment 5. Secondary Dressing Applied Bordered Foam Dressing Dry Gauze Notes bactroban Electronic Signature(s) Signed: 08/22/2017 10:00:44 AM By: Roger Shelter Entered By: Roger Shelter on  08/22/2017 08:09:46 [name], [name] ([id number]) -------------------------------------------------------------------------------- Vitals Details Patient Name: [name] Date of Service: 08/22/2017 8:00 AM Medical Record Number: [id number] Patient Account Number: [id number] Date of Birth/Sex: 1980-09-10 (36 y.o. M) Treating RN: Roger Shelter Primary Care Darron Stuck: [name] Other Clinician: Referring Charvi Gammage: [name] Treating Lavonne Cass/Extender: Melburn Hake, [name]: 37 Vital Signs Time Taken: 08:04 Temperature (F): 98.6 Height (in): 71 Pulse (bpm): 77 Weight (lbs): 338 Respiratory Rate (breaths/min): 18 Body Mass Index (BMI): 47.1 Blood Pressure (mmHg): 153/87 Reference Range: 80 - 120 mg / dl Electronic Signature(s) Signed: 08/22/2017 10:00:44 AM By: Roger Shelter Entered By: Roger Shelter on 08/22/2017 08:06:14

## 2017-09-05 ENCOUNTER — Encounter: Payer: 59 | Admitting: Physician Assistant

## 2017-09-05 DIAGNOSIS — L97223 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sis of muscle: Secondary | ICD-10-CM | POA: Diagnosis not present

## 2017-09-05 DIAGNOSIS — L88 Pyoderma gangrenosum: Secondary | ICD-10-CM | POA: Diagnosis not present

## 2017-09-05 DIAGNOSIS — L97222 Non-pressure chronic ulcer of left calf with fat layer exposed: Secondary | ICD-10-CM | POA: Diagnosis not present

## 2017-09-10 NOTE — Progress Notes (Signed)
KENDRIK, MCSHAN ([id number]) Visit Report for 09/05/2017 Chief Complaint Document Details Patient Name: [name], [name] Date of Service: 09/05/2017 8:00 AM Medical Record Number: [id number] Patient Account Number: [id number] Date of Birth/Sex: [birth year] (37 y.o. M) Treating RN: Montey Hora Primary Care Provider: [name] Other Clinician: Referring Provider: [name] Treating Provider/Extender: Melburn Hake, Marrissa Dai Weeks in Treatment: 48 Information Obtained from: Patient Chief Complaint He is here in follow up evaluation for LLE pyoderma ulcer Electronic Signature(s) Signed: 09/05/2017 5:53:05 PM By: Worthy Keeler PA-C Entered By: Worthy Keeler on 09/05/2017 08:23:46 PAOLA, FLYNT ([id number]) -------------------------------------------------------------------------------- HPI Details Patient Name: [name] Date of Service: 09/05/2017 8:00 AM Medical Record Number: [id number] Patient Account Number: [id number] Date of Birth/Sex: July 04, 1980 (37 y.o. M) Treating RN: Montey Hora Primary Care Provider: [name] Other Clinician: Referring Provider: [name] Treating Provider/Extender: Melburn Hake, Nathania Waldman Weeks in Treatment: 70 History of Present Illness HPI Description: 12/04/16; 37 year old man who comes into the clinic today for review of a wound on the posterior left calf. He tells me that is been there for about a year. He is not a diabetic he does smoke half a pack per day. He was seen in the ER on 11/20/16 felt to have cellulitis around the wound and was given clindamycin. An x-ray did not show osteomyelitis. The patient initially tells me that he has a milk allergy that sets off a pruritic itching rash on his lower legs which she scratches incessantly and he thinks that's what may have set up the wound. He has been using various topical antibiotics and ointments without any effect. He works in a [employer]ing [employer] and is on his feet all day. He does not have a  prior history of wounds however he does have the rash on both lower legs the right arm and the ventral aspect of his left arm. These are excoriations and clearly have had scratching however there are of macular looking areas on both legs including a substantial larger area on the right leg. This does not have an underlying open area. There is no blistering. The patient tells me that 2 years ago in [place] in response to the rash on his legs he saw a dermatologist who told him he had a condition which may be pyoderma gangrenosum although I may be putting words into his mouth. He seemed to recognize this. 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 He is not in any treatment for this. He's had no recent travel 12/11/16; the patient arrives today with his wound and roughly the same condition we've been using silver alginate this is a deep punched out wound with some surrounding erythema but no tenderness. Biopsy I did did not show confirmed pyoderma gangrenosum suggested nonspecific inflammation and vasculitis but does not provide an actual description of what was seen by the pathologist. I'm really not able to understand this We have also received information from the patient's dermatologist in [place] notes from April 2016. This was a doctor Agarwal- antal. 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 He was prescribed topical steroid high potency under occlusion which helped but at this point the patient did not have a deep punched out wound. 12/18/16; the patient's wound is larger in terms of surface area however this surface looks better and there is less depth. The surrounding erythema also is better. The patient states that the wrap we put on came off 2 days ago when he has been using  his compression stockings. He we are in the process of getting a dermatology consult. 12/26/16 on evaluation today patient's left lower extremity wound shows evidence  of infection with surrounding erythema noted. He has been tolerating the dressing changes but states that he has noted more discomfort. There is a larger area of erythema surrounding the wound. No fevers, chills, nausea, or vomiting noted at this time. With that being said the wound still does have slough covering the surface. He is not allergic to any medication that he is aware of at this point. In regard to his right lower extremity he had several regions that are erythematous and pruritic he wonders if there's anything we can do to help that. 01/02/17 I reviewed patient's wound culture which was obtained his visit last week. He was placed on doxycycline at that point. Unfortunately that does not appear to be an antibiotic that would likely help with the situation however the pseudomonas noted on culture is sensitive to Cipro. Also unfortunately patient's wound seems to have a large compared to last week's evaluation. Not severely so but there are definitely increased measurements in general. He is continuing to have discomfort as well he writes this to be a seven out of 10. In fact he would prefer me not to perform any debridement today due to the fact that he is having discomfort and considering he has an active infection on the little reluctant to do so anyway. No fevers, chills, nausea, or vomiting noted at this time. 01/08/17; patient seems dermatology on September 5. I suspect dermatology will want the slides from the biopsy I did sent to their pathologist. I'm not sure if there is a way we can expedite that. In any case the culture I did before I left on vacation 3 weeks ago showed Pseudomonas he was given 10 days of Cipro and per her description of her intake nurses is actually somewhat better this week although the wound is quite a bit bigger than I remember the last time I saw this. He still has 3 more days of Cipro [name], [name] ([id number]) 01/21/17; dermatology appointment tomorrow. He  has completed the ciprofloxacin for Pseudomonas. Surface of the wound looks better however he is had some deterioration in the lesions on his right leg. Meantime the left lateral leg wound we will continue with sample 01/29/17; patient had his dermatology appointment but I can't yet see that note. He is completed his antibiotics. The wound is more superficial but considerably larger in circumferential area than when he came in. This is in his left lateral calf. He also has swollen erythematous areas with superficial wounds on the right leg and small papular areas on both arms. There apparently areas in her his upper thighs and buttocks I did not look at those. Dermatology biopsied the right leg. Hopefully will have their input next week. 02/05/17; patient went back to see his dermatologist who told him that he had a "scratching problem" as well as staph. He is now on a 30 day course of doxycycline and I believe she gave him triamcinolone cream to the right leg areas to help with the itching [not exactly sure but probably triamcinolone]. She apparently looked at the left lateral leg wound although this was not rebiopsied and I think felt to be ultimately part of the same pathogenesis. He is using sample border foam and changing nevus himself. He now has a new open area on the right posterior leg which was his biopsy site I don't  have any of the dermatology notes 02/12/17; we put the patient in compression last week with SANTYL to the wound on the left leg and the biopsy. Edema is much better and the depth of the wound is now at level of skin. Area is still the same oBiopsy site on the right lateral leg we've also been using santyl with a border foam dressing and he is changing this himself. 02/19/17; Using silver alginate started last week to both the substantial left leg wound and the biopsy site on the right wound. He is tolerating compression well. Has a an appointment with his primary M.D. tomorrow  wondering about diuretics although I'm wondering if the edema problem is actually lymphedema 02/26/17; the patient has been to see his primary doctor Dr. Jerrel Ivory at [facility]. She started him on Lasix 20 mg and this seems to have helped with the edema. However we are not making substantial change with the left lateral calf wound and inflammation. The biopsy site on the right leg also looks stable but not really all that different. 03/12/17; the patient has been to see vein and vascular Dr. Lucky Cowboy. He has had venous reflux studies I have not reviewed these. I did get a call from his dermatology office. 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 They therefore recommended clobetasol and tetracycline i.e. 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 03/26/17; apparently the patient just had reflux studies not an appointment with Dr. dew. She arrives in clinic today having applied clobetasol for 2-3 weeks. He notes over the last 2-3 days excessive drainage having to change the dressing 3-4 times a day and also expanding erythema. 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 Lillia Carmel at [facility] dermatology locally who kindly saw him at my request. 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 My concern was the degree of erythema medially extending from his left leg wound which was either inflammation from  pyoderma or cellulitis. I put him on Augmentin however culture of the wound showed Pseudomonas which is quinolone sensitive. I really don't believe he has cellulitis however in view of everything I will continue and give him a course of Cipro. He is also on doxycycline as an immune modulator for the pyoderma. In addition to his original wound on the left lateral leg with surrounding erythema he has a wound on the right posterior calf which was an original biopsy site done by dermatology. 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 Saw Dr. Lillia Carmel yesterday. He has been using topical antibiotics to both wound areas his original wound on the left and the biopsies/pathergy area on the right. There is definitely some improvement in the inflammation around the wound on the right although the patient states he has increasing sensitivity of the wounds. He is on prednisone 60 and doxycycline 1 as prescribed by Dr. Lillia Carmel. He is covering the topical  antibiotic with gauze and putting this in his own compression stocks and changing this daily. He states that Dr. Lottie Rater did a culture of the left leg wound yesterday 05/07/17; pyoderma gangrenosum. The patient saw Dr. Lillia Carmel yesterday and has a follow-up with her in one month. He is still using topical antibiotics to both wounds although he can't recall exactly what type. He is still on prednisone 60 mg. Dr. Lillia Carmel stated that the doxycycline could stop if we were in agreement. 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 The right medial leg was induced by biopsy/pathergy. The area on the right is essentially healed. 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 The right medial leg has closed and  remains closed. He is still on prednisone 60. oHe tells me he missed his last dermatology appointment with Dr. Lillia Carmel but will make another appointment. He reports that her blood sugar at a recent screen in [place] was high 200's. He was 180 today. He is more cushingoid blood pressure is [name], [name] ([id number]) up a bit. I think he is going to require still much longer prednisone perhaps another 3 months before attempting to taper. In the meantime his wound is a lot better. Smaller. He is cleaning this off daily and applying topical antibiotics. When he was last in the clinic I thought about changing to [facility] and actually put in a couple of calls to dermatology although probably not during their business hours. 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 He continues on oral prednisone. He has been using triple antibiotic ointment. There is surface debris and we will transition to [facility] and have him return in 2 weeks. He has lost 30 pounds since his last appointment with lifestyle modification. He may benefit from topical steroid cream for treatment this can be considered at a later date. 08/22/17 on evaluation today patient appears to actually be doing rather well in regard to his left lateral lower extremity ulcer. He has actually been managed by Dr. Dellia Nims most recently. Patient is currently on oral steroids at this time. This seems to have been of benefit for him. Nonetheless his last visit was actually with Leah on 08/06/17. Currently he is not utilizing any topical steroid creams although this could be of benefit as well. No fevers, chills, nausea, or vomiting noted at this time. 09/05/17 on evaluation today patient appears to be doing better in regard to his left lateral lower extremity ulcer. He has been tolerating the dressing changes without complication.  He is using Santyl with good effect. Overall I'm very pleased with how things are standing at this point. Patient likewise is happy that this is doing better. Electronic Signature(s) Signed: 09/05/2017 5:53:05 PM By: Worthy Keeler PA-C Entered By: Worthy Keeler on 09/05/2017 11:14:28 [name], [name] ([id number]) -------------------------------------------------------------------------------- Physical Exam Details Patient Name: [name] Date of Service: 09/05/2017 8:00 AM Medical Record Number: [id number] Patient Account Number: [id number] Date of Birth/Sex: 05-Jan-1981 (36 y.o. M) Treating RN: Montey Hora Primary Care Provider: [name] Other Clinician: Referring Provider: [name] Treating Provider/Extender: Melburn Hake, Amoni Morales Weeks in Treatment: 1 Constitutional Well-nourished and well-hydrated in no acute distress. Respiratory normal breathing without difficulty. clear  to auscultation bilaterally. Cardiovascular regular rate and rhythm with normal S1, S2. Psychiatric this patient is able to make decisions and demonstrates good insight into disease process. Alert and Oriented x 3. pleasant and cooperative. Notes On inspection patient's wound did have some Slough noted currently. No sharp debridement was performed per patient request although I do believe this is progressing rather nicely which is good news. Mechanical debridement utilizing saline and gauze was performed. Electronic Signature(s) Signed: 09/05/2017 5:53:05 PM By: Worthy Keeler PA-C Entered By: Worthy Keeler on 09/05/2017 11:15:09 [name] ([id number]) -------------------------------------------------------------------------------- Physician Orders Details Patient Name: [name] Date of Service: 09/05/2017 8:00 AM Medical Record Number: [id number] Patient Account Number: [id number] Date of Birth/Sex: 04-Sep-1980 (36 y.o. M) Treating RN: Montey Hora Primary Care Provider: [name] Other Clinician: Referring Provider: [name] Treating Provider/Extender: Melburn Hake, Takiya Belmares Weeks in Treatment: 29 Verbal / Phone Orders: No Diagnosis Coding ICD-10 Coding Code Description [phone] 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sis of muscle L88 Pyoderma gangrenosum I87.2 Venous insufficiency (chronic) (peripheral) Wound Cleansing Wound #1 Left,Lateral Lower Leg o Clean wound with Normal Saline. Anesthetic (add to Medication List) Wound #1 Left,Lateral Lower Leg o Topical Lidocaine 4% cream applied to wound bed prior to debridement (In Clinic Only). Primary Wound Dressing Wound #1 Left,Lateral Lower Leg o Santyl Ointment Secondary Dressing Wound #1 Left,Lateral Lower Leg o XtraSorb - secure with tape Dressing Change Frequency Wound #1 Left,Lateral Lower Leg o Change dressing every day. Follow-up Appointments Wound #1 Left,Lateral Lower Leg o Return Appointment in 2 weeks. Edema Control Wound #1 Left,Lateral Lower Leg o Patient to wear own compression stockings Electronic Signature(s) Signed: 09/05/2017 3:07:03 PM By: Montey Hora Signed: 09/05/2017 5:53:05 PM By: Worthy Keeler PA-C Entered By: Montey Hora on 09/05/2017 08:38:28 [name], [name] ([id number]) [name], [name] ([id number]) -------------------------------------------------------------------------------- Problem List Details Patient Name: [name] Date of Service: 09/05/2017 8:00 AM Medical Record Number: [id number] Patient Account Number: [id number] Date of Birth/Sex: [birth year] (37 y.o. M) Treating RN: Montey Hora Primary Care Provider: [name] Other Clinician: Referring Provider: [name] Treating Provider/Extender: Melburn Hake, Damoni Causby Weeks in Treatment: 40 Active Problems ICD-10 Impacting Encounter Code Description Active Date Wound Healing Diagnosis L97.223 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sis of muscle 12/04/2016  Yes L88 Pyoderma gangrenosum 03/26/2017 Yes I87.2 Venous insufficiency (chronic) (peripheral) 12/04/2016 Yes Inactive Problems ICD-10 Code Description Active Date Inactive Date L97.213 Non-pressure chronic ulcer of right calf with necrosis of muscle 04/02/2017 04/02/2017 Resolved Problems Electronic Signature(s) Signed: 09/05/2017 5:53:05 PM By: Worthy Keeler PA-C Entered By: Worthy Keeler on 09/05/2017 08:23:31 Such, [name] ([id number]) -------------------------------------------------------------------------------- Progress Note Details Patient Name: [name] Date of Service: 09/05/2017 8:00 AM Medical Record Number: [id number] Patient Account Number: [id number] Date of Birth/Sex: 1981-01-21 (36 y.o. M) Treating RN: Montey Hora Primary Care Provider: [name] Other Clinician: Referring Provider: [name] Treating Provider/Extender: Melburn Hake, Nikiah Goin Weeks in Treatment: 74 Subjective Chief Complaint Information obtained from Patient He is here in follow up evaluation for LLE pyoderma ulcer History of Present Illness (HPI) 12/04/16; 37 year old man who comes into the clinic today for review of a wound on the posterior left calf. He tells me that is been there for about a year. He is not a diabetic he does smoke half a pack per day. He was seen in the ER on 11/20/16 felt to have cellulitis around the wound and was given clindamycin. An x-ray did not show osteomyelitis. The patient initially tells me  that he has a milk allergy that sets off a pruritic itching rash on his lower legs which she scratches incessantly and he thinks that's what may have set up the wound. He has been using various topical antibiotics and ointments without any effect. He works in a [employer]ing [employer] and is on his feet all day. He does not have a prior history of wounds however he does have the rash on both lower legs the right arm and the ventral aspect of his left arm. These are excoriations and  clearly have had scratching however there are of macular looking areas on both legs including a substantial larger area on the right leg. This does not have an underlying open area. There is no blistering. The patient tells me that 2 years ago in [place] in response to the rash on his legs he saw a dermatologist who told him he had a condition which may be pyoderma gangrenosum although I may be putting words into his mouth. He seemed to recognize this. 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 He is not in any treatment for this. He's had no recent travel 12/11/16; the patient arrives today with his wound and roughly the same condition we've been using silver alginate this is a deep punched out wound with some surrounding erythema but no tenderness. Biopsy I did did not show confirmed pyoderma gangrenosum suggested nonspecific inflammation and vasculitis but does not provide an actual description of what was seen by the pathologist. I'm really not able to understand this We have also received information from the patient's dermatologist in [place] notes from April 2016. This was a doctor Agarwal- antal. 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 He was prescribed topical steroid high potency under occlusion which helped but at this point the patient did not have a deep punched out wound. 12/18/16; the patient's wound is larger in terms of surface area however this surface looks better and there is less depth. The surrounding erythema also is better. The patient states that the wrap we put on came off 2 days ago when he has been using his compression stockings. He we are in the process of getting a dermatology consult. 12/26/16 on evaluation today patient's left lower extremity wound shows evidence of infection with surrounding erythema noted. He has been tolerating the dressing changes but states that he has noted more discomfort. There is a larger  area of erythema surrounding the wound. No fevers, chills, nausea, or vomiting noted at this time. With that being said the wound still does have slough covering the surface. He is not allergic to any medication that he is aware of at this point. In regard to his right lower extremity he had several regions that are erythematous and pruritic he wonders if there's anything we can do to help that. 01/02/17 I reviewed patient's wound culture which was obtained his visit last week. He was placed on doxycycline at that point. Unfortunately that does not appear to be an antibiotic that would likely help with the situation however the pseudomonas noted on culture is sensitive to Cipro. Also unfortunately patient's wound seems to have a large compared to last week's evaluation. Not severely so but there are definitely increased measurements in general. He is continuing to have discomfort as well he writes this to be a seven out of 10. In fact he would prefer me not to perform any debridement today due to the fact that he  is having discomfort and considering he has an active infection on the little reluctant to do so anyway. No fevers, [name], [name] ([id number]) chills, nausea, or vomiting noted at this time. 01/08/17; patient seems dermatology on September 5. I suspect dermatology will want the slides from the biopsy I did sent to their pathologist. I'm not sure if there is a way we can expedite that. In any case the culture I did before I left on vacation 3 weeks ago showed Pseudomonas he was given 10 days of Cipro and per her description of her intake nurses is actually somewhat better this week although the wound is quite a bit bigger than I remember the last time I saw this. He still has 3 more days of Cipro 01/21/17; dermatology appointment tomorrow. He has completed the ciprofloxacin for Pseudomonas. Surface of the wound looks better however he is had some deterioration in the lesions on his right leg.  Meantime the left lateral leg wound we will continue with sample 01/29/17; patient had his dermatology appointment but I can't yet see that note. He is completed his antibiotics. The wound is more superficial but considerably larger in circumferential area than when he came in. This is in his left lateral calf. He also has swollen erythematous areas with superficial wounds on the right leg and small papular areas on both arms. There apparently areas in her his upper thighs and buttocks I did not look at those. Dermatology biopsied the right leg. Hopefully will have their input next week. 02/05/17; patient went back to see his dermatologist who told him that he had a "scratching problem" as well as staph. He is now on a 30 day course of doxycycline and I believe she gave him triamcinolone cream to the right leg areas to help with the itching [not exactly sure but probably triamcinolone]. She apparently looked at the left lateral leg wound although this was not rebiopsied and I think felt to be ultimately part of the same pathogenesis. He is using sample border foam and changing nevus himself. He now has a new open area on the right posterior leg which was his biopsy site I don't have any of the dermatology notes 02/12/17; we put the patient in compression last week with SANTYL to the wound on the left leg and the biopsy. Edema is much better and the depth of the wound is now at level of skin. Area is still the same Biopsy site on the right lateral leg we've also been using santyl with a border foam dressing and he is changing this himself. 02/19/17; Using silver alginate started last week to both the substantial left leg wound and the biopsy site on the right wound. He is tolerating compression well. Has a an appointment with his primary M.D. tomorrow wondering about diuretics although I'm wondering if the edema problem is actually lymphedema 02/26/17; the patient has been to see his primary doctor Dr.  Jerrel Ivory at [facility] our primary care. She started him on Lasix 20 mg and this seems to have helped with the edema. However we are not making substantial change with the left lateral calf wound and inflammation. The biopsy site on the right leg also looks stable but not really all that different. 03/12/17; the patient has been to see vein and vascular Dr. Lucky Cowboy. He has had venous reflux studies I have not reviewed these. I did get a call from his dermatology office. They felt that he might have pathergy based on their biopsy on  his right leg which led them to look at the slides of the biopsy I did on the left leg and they wonder whether this represents pyoderma gangrenosum which was the original supposition in a man with ulcerative colitis albeit inactive for many years. They therefore recommended clobetasol and tetracycline i.e. 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 03/26/17; apparently the patient just had reflux studies not an appointment with Dr. dew. She arrives in clinic today having applied clobetasol for 2-3 weeks. He notes over the last 2-3 days excessive drainage having to change the dressing 3-4 times a day and also expanding erythema. 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 Lillia Carmel at [facility] dermatology locally who kindly saw him at my request. 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 My concern was the degree of erythema medially extending from his left leg wound which was either inflammation from pyoderma or cellulitis. I put him on Augmentin however culture of the wound showed Pseudomonas which is quinolone sensitive. I really don't believe he has  cellulitis however in view of everything I will continue and give him a course of Cipro. He is also on doxycycline as an immune modulator for the pyoderma. In addition to his original wound on the left lateral leg with surrounding erythema he has a wound on the right posterior calf which was an original biopsy site done by dermatology. 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 Saw Dr. Lillia Carmel yesterday. He has been using topical antibiotics to both wound areas his original wound on the left and the biopsies/pathergy area on the right. There is definitely some improvement in the inflammation around the wound on the right although the patient states he has increasing sensitivity of the wounds. He is on prednisone 60 and doxycycline 1 as prescribed by Dr. Lillia Carmel. He is covering the topical antibiotic with gauze and putting this in his own compression stocks and changing this daily. He states that Dr. Lottie Rater did a culture of the left leg wound yesterday 05/07/17; pyoderma gangrenosum. The patient saw Dr. Lillia Carmel yesterday and has a follow-up with her in one month. He is still using topical antibiotics to both wounds although he can't recall exactly what type. He is still on prednisone 60 mg. Dr. Lillia Carmel stated that the doxycycline could stop if we were in agreement. 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 The right medial leg was induced by [name] ([id number]) biopsy/pathergy. The area on the right is essentially healed. 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 The right medial leg has closed and remains closed. He is still on prednisone 60. He tells me he missed his last dermatology appointment with Dr. Lillia Carmel but will make  another appointment. He reports that her blood sugar at a recent screen in [place] was high 200's. He was 180 today. 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 I think he is going to require still much longer prednisone perhaps another 3 months before attempting to taper. In the meantime  his wound is a lot better. Smaller. He is cleaning this off daily and applying topical antibiotics. When he was last in the clinic I thought about changing to [facility] and actually put in a couple of calls to dermatology although probably not during their business hours. 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 He continues on oral prednisone. He has been using triple antibiotic ointment. There is surface debris and we will transition to [facility] and have him return in 2 weeks. He has lost 30 pounds since his last appointment with lifestyle modification. He may benefit from topical steroid cream for treatment this can be considered at a later date. 08/22/17 on evaluation today patient appears to actually be doing rather well in regard to his left lateral lower extremity ulcer. He has actually been managed by Dr. Dellia Nims most recently. Patient is currently on oral steroids at this time. This seems to have been of benefit for him. Nonetheless his last visit was actually with Leah on 08/06/17. Currently he is not utilizing any topical steroid creams although this could be of benefit as well. No fevers, chills, nausea, or vomiting noted at this time. 09/05/17 on evaluation today patient appears to be doing better in regard to his left lateral lower extremity ulcer. He has been tolerating the dressing changes without complication. He is using Santyl with good effect. Overall I'm very pleased with how things are standing at this point. Patient likewise is happy that this is doing  better. Patient History Information obtained from Patient. Family History Diabetes - Mother,Maternal Grandparents, Heart Disease - Mother,Maternal Grandparents, Hereditary Spherocytosis - Siblings, Hypertension - Maternal Grandparents, Stroke - Siblings, No family history of Cancer, Kidney Disease, Lung Disease, Seizures, Thyroid Problems, Tuberculosis. Social History Current some day smoker, Marital Status - Married, Alcohol Use - Rarely, Drug Use - No History, Caffeine Use - Moderate. Review of Systems (ROS) Constitutional Symptoms (General Health) Denies complaints or symptoms of Fever, Chills, Marked Weight Change. Respiratory The patient has no complaints or symptoms. Cardiovascular The patient has no complaints or symptoms. Psychiatric The patient has no complaints or symptoms. Objective Constitutional Well-nourished and well-hydrated in no acute distress. [name], [name] ([id number]) Vitals Time Taken: 8:08 AM, Height: 71 in, Weight: 338 lbs, BMI: 47.1, Temperature: 98.3 F, Pulse: 88 bpm, Respiratory Rate: 18 breaths/min, Blood Pressure: 143/88 mmHg. Respiratory normal breathing without difficulty. clear to auscultation bilaterally. Cardiovascular regular rate and rhythm with normal S1, S2. Psychiatric this patient is able to make decisions and demonstrates good insight into disease process. Alert and Oriented x 3. pleasant and cooperative. General Notes: On inspection patient's wound did have some Slough noted currently. No sharp debridement was performed per patient request although I do believe this is progressing rather nicely which is good news. Mechanical debridement utilizing saline and gauze was performed. Integumentary (Hair, Skin) Wound #1 status is Open. Original cause of wound was Gradually Appeared. The wound is located on the Left,Lateral Lower Leg. The wound measures 1.5cm length x 2cm width x 0.3cm depth; 2.356cm^2 area and 0.707cm^3 volume. There is  Fat Layer (Subcutaneous Tissue) Exposed exposed. There is no tunneling or undermining noted. There is a medium amount of serosanguineous drainage noted. The wound margin is distinct with the outline attached to the wound base. There is small (1-33%) red granulation within the wound bed. There is a large (  67-100%) amount of necrotic tissue within the wound bed including Adherent Slough. The periwound skin appearance exhibited: Induration, Erythema. The periwound skin appearance did not exhibit: Callus, Crepitus, Excoriation, Rash, Scarring, Dry/Scaly, Maceration, Atrophie Blanche, Cyanosis, Ecchymosis, Hemosiderin Staining, Mottled, Pallor, Rubor. The surrounding wound skin color is noted with erythema which is circumferential. Periwound temperature was noted as No Abnormality. The periwound has tenderness on palpation. 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 Anesthetic (add to Medication List): Wound #1 Left,Lateral Lower Leg: Topical Lidocaine 4% cream applied to wound bed prior to debridement (In Clinic Only). Primary Wound Dressing: Wound #1 Left,Lateral Lower Leg: Santyl Ointment [name], [name] ([id number]) Secondary Dressing: Wound #1 Left,Lateral Lower Leg: XtraSorb - secure with tape Dressing Change Frequency: Wound #1 Left,Lateral Lower Leg: Change dressing every day. Follow-up Appointments: Wound #1 Left,Lateral Lower Leg: Return Appointment in 2 weeks. Edema Control: Wound #1 Left,Lateral Lower Leg: Patient to wear own compression stockings I am going to suggest at this point that we continue with the Current wound care measures for the next week. We will see the patient for a follow-up visit to see were things stand. Please see above for  specific wound care orders. We will see patient for re-evaluation in 2 week(s) here in the clinic. If anything worsens or changes patient will contact our office for additional recommendations. Electronic Signature(s) Signed: 09/05/2017 5:53:05 PM By: Worthy Keeler PA-C Entered By: Worthy Keeler on 09/05/2017 11:15:47 [name] ([id number]) -------------------------------------------------------------------------------- ROS/PFSH Details Patient Name: [name] Date of Service: 09/05/2017 8:00 AM Medical Record Number: [id number] Patient Account Number: [id number] Date of Birth/Sex: 1980/12/14 (36 y.o. M) Treating RN: Montey Hora Primary Care Provider: [name] Other Clinician: Referring Provider: [name] Treating Provider/Extender: Melburn Hake, Raelee Rossmann Weeks in Treatment: 67 Information Obtained From Patient Wound History Do you currently have one or more open woundso Yes How many open wounds do you currently haveo 1 Approximately how long have you had your woundso 1 year How have you been treating your wound(s) until nowo neosporin Has your wound(s) ever healed and then re-openedo No Have you had any lab work done in the past montho No Have you tested positive for an antibiotic resistant organism (MRSA, VRE)o No Have you tested positive for osteomyelitis (bone infection)o No Have you had any tests for circulation on your legso No Have you had other problems associated with your woundso Infection, Swelling Constitutional Symptoms (General Health) Complaints and Symptoms: Negative for: Fever; Chills; Marked Weight Change Eyes Medical History: Negative for: Cataracts; Glaucoma; Optic Neuritis Ear/Nose/Mouth/Throat Medical History: Negative for: Chronic sinus problems/congestion; Middle ear problems Hematologic/Lymphatic Medical History: Negative for: Anemia; Hemophilia; Human Immunodeficiency Virus; Lymphedema; Sickle Cell Disease Respiratory Complaints  and Symptoms: No Complaints or Symptoms Medical History: Positive for: Sleep Apnea - cpap Negative for: Aspiration; Asthma; Chronic Obstructive Pulmonary Disease (COPD); Pneumothorax Cardiovascular Complaints and Symptoms: No Complaints or Symptoms Medical History: Positive for: Hypertension [name], [name] ([id number]) Negative for: Angina; Arrhythmia; Congestive Heart Failure; Coronary Artery Disease; Deep Vein Thrombosis; Hypotension; Myocardial Infarction; Peripheral Arterial Disease; Peripheral Venous Disease; Phlebitis; Vasculitis Gastrointestinal Medical History: Positive for: Colitis Endocrine Medical History: Negative for: Type I Diabetes; Type II Diabetes Genitourinary Medical History: Negative for: End Stage Renal Disease Immunological Medical History: Negative for: Lupus Erythematosus; Raynaudos; Scleroderma Integumentary (Skin) Medical History: Negative  for: History of Burn; History of pressure wounds Musculoskeletal Medical History: Negative for: Gout; Rheumatoid Arthritis; Osteoarthritis; Osteomyelitis Neurologic Medical History: Negative for: Dementia; Neuropathy; Quadriplegia; Paraplegia; Seizure Disorder Oncologic Medical History: Negative for: Received Chemotherapy; Received Radiation Psychiatric Complaints and Symptoms: No Complaints or Symptoms Medical History: Negative for: Anorexia/bulimia; Confinement Anxiety Immunizations Pneumococcal Vaccine: Received Pneumococcal Vaccination: No Implantable Devices Family and Social History [name], [name] ([id number]) Cancer: No; Diabetes: Yes - Mother,Maternal Grandparents; Heart Disease: Yes - Mother,Maternal Grandparents; Hereditary Spherocytosis: Yes - Siblings; Hypertension: Yes - Maternal Grandparents; Kidney Disease: No; Lung Disease: No; Seizures: No; Stroke: Yes - Siblings; Thyroid Problems: No; Tuberculosis: No; Current some day smoker; Marital Status - Married; Alcohol Use: Rarely; Drug Use: No  History; Caffeine Use: Moderate; Financial Concerns: No; Food, Clothing or Shelter Needs: No; Support System Lacking: No; Transportation Concerns: No; Advanced Directives: No; Patient does not want information on Advanced Directives; Living Will: No Physician Affirmation I have reviewed and agree with the above information. Electronic Signature(s) Signed: 09/05/2017 3:07:03 PM By: Montey Hora Signed: 09/05/2017 5:53:05 PM By: Worthy Keeler PA-C Entered By: Worthy Keeler on 09/05/2017 11:14:49 [name] ([id number]) -------------------------------------------------------------------------------- SuperBill Details Patient Name: [name] Date of Service: 09/05/2017 Medical Record Number: [id number] Patient Account Number: [id number] Date of Birth/Sex: 22-Feb-1981 (36 y.o. M) Treating RN: Montey Hora Primary Care Provider: [name] Other Clinician: Referring Provider: [name] Treating Provider/Extender: Melburn Hake, Myia Bergh Weeks in Treatment: 48 Diagnosis Coding ICD-10 Codes Code Description [phone] 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sis of muscle L88 Pyoderma gangrenosum I87.2 Venous insufficiency (chronic) (peripheral) Facility Procedures CPT4 Code: [id number] Description: 46503 - WOUND CARE VISIT-LEV 3 EST PT Modifier: Quantity: 1 Physician Procedures CPT4 Code: [id number] Description: 27517 - WC PHYS LEVEL 3 - EST PT ICD-10 Diagnosis Description L97.223 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sis of m L88 Pyoderma gangrenosum I87.2 Venous insufficiency (chronic) (peripheral) Modifier: uscle Quantity: 1 Electronic Signature(s) Signed: 09/05/2017 5:53:05 PM By: Worthy Keeler PA-C Entered By: Worthy Keeler on 09/05/2017 11:16:03

## 2017-09-11 NOTE — Progress Notes (Signed)
[name], [name] ([id number]) Visit Report for 09/05/2017 Arrival Information Details Patient Name: [name] Date of Service: 09/05/2017 8:00 AM Medical Record Number: [id number] Patient Account Number: [id number] Date of Birth/Sex: Feb 20, 1981 (37 y.o. M) Treating RN: Roger Shelter Primary Care Emilene Roma: [name] Other Clinician: Referring Eriel Doyon: [name] Treating Arvie [name]/Extender: Melburn Hake, [name]: 47 Visit Information History Since Last Visit All ordered tests and consults were completed: No Patient Arrived: Ambulatory Added or deleted any medications: No Arrival Time: 08:07 Any new allergies or adverse reactions: No Accompanied By: self Had a fall or experienced change in No Transfer Assistance: None activities of daily living that may affect Patient Identification Verified: Yes risk of falls: Secondary Verification Process Completed: Yes Signs or symptoms of abuse/neglect since last visito No Patient Requires Transmission-Based No Hospitalized since last visit: No Precautions: Implantable device outside of the clinic excluding No Patient Has Alerts: No cellular tissue based products placed in the center since last visit: Pain Present Now: No Electronic Signature(s) Signed: 09/08/2017 4:40:07 PM By: Roger Shelter Entered By: Roger Shelter on 09/05/2017 08:08:05 [name] ([id number]) -------------------------------------------------------------------------------- Clinic Level of Care Assessment Details Patient Name: [name] Date of Service: 09/05/2017 8:00 AM Medical Record Number: [id number] Patient Account Number: [id number] Date of Birth/Sex: [birth year] (36 y.o. M) Treating RN: Montey Hora Primary Care Laylanie Kruczek: [name] Other Clinician: Referring Jenisa Monty: [name] Treating Jerris Fleer/Extender: Melburn Hake, [name]: 36 Clinic Level of Care Assessment Items TOOL 4 Quantity Score []  -  Use when only an EandM is performed on FOLLOW-UP visit 0 ASSESSMENTS - Nursing Assessment / Reassessment X - Reassessment of Co-morbidities (includes updates in patient status) 1 10 X- 1 5 Reassessment of Adherence to Treatment Plan ASSESSMENTS - Wound and Skin Assessment / Reassessment X - Simple Wound Assessment / Reassessment - one wound 1 5 []  - 0 Complex Wound Assessment / Reassessment - multiple wounds []  - 0 Dermatologic / Skin Assessment (not related to wound area) ASSESSMENTS - Focused Assessment []  - Circumferential Edema Measurements - multi extremities 0 []  - 0 Nutritional Assessment / Counseling / Intervention X- 1 5 Lower Extremity Assessment (monofilament, tuning fork, pulses) []  - 0 Peripheral Arterial Disease Assessment (using hand held doppler) ASSESSMENTS - Ostomy and/or Continence Assessment and Care []  - Incontinence Assessment and Management 0 []  - 0 Ostomy Care Assessment and Management (repouching, etc.) PROCESS - Coordination of Care X - Simple Patient / Family Education for ongoing care 1 15 []  - 0 Complex (extensive) Patient / Family Education for ongoing care []  - 0 Staff obtains Programmer, systems, Records, Test Results / Process Orders []  - 0 Staff telephones HHA, Nursing Homes / Clarify orders / etc []  - 0 Routine Transfer to another Facility (non-emergent condition) []  - 0 Routine Hospital Admission (non-emergent condition) []  - 0 New Admissions / Biomedical engineer / Ordering NPWT, Apligraf, etc. []  - 0 Emergency Hospital Admission (emergent condition) X- 1 10 Simple Discharge Coordination [name], [name] ([id number]) []  - 0 Complex (extensive) Discharge Coordination PROCESS - Special Needs []  - Pediatric / Minor Patient Management 0 []  - 0 Isolation Patient Management []  - 0 Hearing / Language / Visual special needs []  - 0 Assessment of Community assistance (transportation, D/C planning, etc.) []  - 0 Additional assistance / Altered  mentation []  - 0 Support Surface(s) Assessment (bed, cushion, seat, etc.) INTERVENTIONS - Wound Cleansing / Measurement X - Simple Wound Cleansing - one wound 1 5 []  - 0 Complex Wound Cleansing - multiple wounds  X- 1 5 Wound Imaging (photographs - any number of wounds) []  - 0 Wound Tracing (instead of photographs) X- 1 5 Simple Wound Measurement - one wound []  - 0 Complex Wound Measurement - multiple wounds INTERVENTIONS - Wound Dressings X - Small Wound Dressing one or multiple wounds 1 10 []  - 0 Medium Wound Dressing one or multiple wounds []  - 0 Large Wound Dressing one or multiple wounds []  - 0 Application of Medications - topical []  - 0 Application of Medications - injection INTERVENTIONS - Miscellaneous []  - External ear exam 0 []  - 0 Specimen Collection (cultures, biopsies, blood, body fluids, etc.) []  - 0 Specimen(s) / Culture(s) sent or taken to Lab for analysis []  - 0 Patient Transfer (multiple staff / Civil Service fast streamer / Similar devices) []  - 0 Simple Staple / Suture removal (25 or less) []  - 0 Complex Staple / Suture removal (26 or more) []  - 0 Hypo / Hyperglycemic Management (close monitor of Blood Glucose) []  - 0 Ankle / Brachial Index (ABI) - do not check if billed separately X- 1 5 Vital Signs [name], [name] ([id number]) Has the patient been seen at the hospital within the last three years: Yes Total Score: 80 Level Of Care: New/Established - Level 3 Electronic Signature(s) Signed: 09/05/2017 3:07:03 PM By: Montey Hora Entered By: Montey Hora on 09/05/2017 08:38:55 [name] ([id number]) -------------------------------------------------------------------------------- Encounter Discharge Information Details Patient Name: [name] Date of Service: 09/05/2017 8:00 AM Medical Record Number: [id number] Patient Account Number: [id number] Date of Birth/Sex: 11/25/1980 (36 y.o. M) Treating RN: Montey Hora Primary Care Natallie Ravenscroft: [name]  Other Clinician: Referring Yuta Cipollone: [name] Treating Brigitt Mcclish/Extender: Melburn Hake, [name]: 75 Encounter Discharge Information Items Discharge Pain Level: 0 Discharge Condition: Stable Ambulatory Status: Ambulatory Discharge Destination: Home Transportation: Private Auto Accompanied By: self Schedule Follow-up Appointment: Yes Medication Reconciliation completed and No provided to Patient/Care Kasai Beltran: Patient Clinical Summary of Care: Declined Electronic Signature(s) Signed: 09/05/2017 8:50:33 AM By: Alric Quan Entered By: Alric Quan on 09/05/2017 08:50:33 [name] ([id number]) -------------------------------------------------------------------------------- Lower Extremity Assessment Details Patient Name: [name] Date of Service: 09/05/2017 8:00 AM Medical Record Number: [id number] Patient Account Number: [id number] Date of Birth/Sex: May 29, 1980 (36 y.o. M) Treating RN: Roger Shelter Primary Care Rhilee Currin: [name] Other Clinician: Referring Rudolpho Claxton: [name] Treating Sherice Ijames/Extender: Melburn Hake, [name]: 39 Edema Assessment Assessed: [Left: No] [Right: No] [Left: Edema] [Right: :] Calf Left: Right: Point of Measurement: 33 cm From Medial Instep 50 cm cm Ankle Left: Right: Point of Measurement: 11 cm From Medial Instep 29.5 cm cm Vascular Assessment Claudication: Claudication Assessment [Left:None] Pulses: Dorsalis Pedis Palpable: [Left:Yes] Posterior Tibial Extremity colors, hair growth, and conditions: Extremity Color: [Left:Red] Hair Growth on Extremity: [Left:Yes] Temperature of Extremity: [Left:Warm] Capillary Refill: [Left:< 3 seconds] Toe Nail Assessment Left: Right: Thick: No Discolored: No Deformed: No Improper Length and Hygiene: No Electronic Signature(s) Signed: 09/08/2017 4:40:07 PM By: Roger Shelter Entered By: Roger Shelter on 09/05/2017 08:15:37 [name] ([id number]) -------------------------------------------------------------------------------- Multi Wound Chart Details Patient Name: [name] Date of Service: 09/05/2017 8:00 AM Medical Record Number: [id number] Patient Account Number: [id number] Date of Birth/Sex: 13-Apr-1981 (36 y.o. 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] [N/A:N/A] Comorbid History: [1:Sleep Apnea, Hypertension, Colitis] [N/A:N/A] Date Acquired: [1:11/18/2015] [N/A:N/A] Weeks of Treatment: [1:39] [N/A:N/A] Wound Status: [1:Open] [N/A:N/A] Measurements L x W x D [1:1.5x2x0.3] [N/A:N/A] (cm) Area (cm) : [1:2.356] [N/A:N/A] Volume (cm) : [1:0.707] [N/A:N/A] % Reduction in Area: [1:52.00%] [N/A:N/A] % Reduction in Volume: [1:82.00%] [N/A:N/A] Classification: [1:Full Thickness Without Exposed Support Structures] [N/A:N/A] Exudate Amount: [1:Medium] [N/A:N/A] Exudate Type: [1:Serosanguineous] [N/A:N/A] Exudate Color: [1:red, brown] [N/A:N/A] Wound Margin: [1:Distinct, outline attached] [N/A:N/A] Granulation Amount: [1:Small (1-33%)] [N/A:N/A] Granulation Quality: [1:Red] [N/A:N/A] Necrotic Amount: [1:Large (67-100%)] [N/A:N/A] Exposed Structures: [1:Fat Layer (Subcutaneous Tissue) Exposed: Yes Fascia: No Tendon: No Muscle: No Joint: No Bone: No] [N/A:N/A] Epithelialization: [1:None] [N/A:N/A] Periwound Skin Texture: [1:Induration: Yes Excoriation: No Callus: No Crepitus: No] [N/A:N/A] Rash: No Scarring:  No Periwound Skin Moisture: Maceration: No N/A N/A Dry/Scaly: No Periwound Skin Color: Erythema: Yes N/A N/A Atrophie Blanche: No Cyanosis: No Ecchymosis: No Hemosiderin Staining: No Mottled: No Pallor: No Rubor: No Erythema Location: Circumferential N/A N/A Erythema Change: Increased N/A N/A Temperature: No Abnormality N/A N/A Tenderness on Palpation: Yes N/A N/A Wound Preparation: Ulcer Cleansing: N/A N/A Rinsed/Irrigated with Saline Topical Anesthetic Applied: None, Other: Lidocaine 4% Treatment Notes Electronic Signature(s) Signed: 09/05/2017 3:07:03 PM By: Montey Hora Entered By: Montey Hora on 09/05/2017 08:35:43 [name] ([id number]) -------------------------------------------------------------------------------- Multi-Disciplinary Care Plan Details Patient Name: [name] Date of Service: 09/05/2017 8:00 AM Medical Record Number: [id number] Patient Account Number: [id number] Date of Birth/Sex: [birth year] (36 y.o. M) Treating RN: Montey Hora Primary Care Jacy Brocker: [name] Other Clinician: Referring Demmi Sindt: [name] Treating Rufina Kimery/Extender: Melburn Hake, [name]: 40 Active Inactive ` Orientation to the Wound Care Program Nursing Diagnoses: Knowledge deficit related to the wound healing center program Goals: Patient/caregiver will verbalize understanding of the [facility] Program Date Initiated: 12/11/2016 Target Resolution Date: 03/13/2017 Goal Status: Active Interventions: Provide education on orientation to the wound center Notes: ` Venous Leg Ulcer Nursing Diagnoses: Knowledge deficit related to disease process and management Potential for venous Insuffiency (use before diagnosis confirmed) Goals: Non-invasive venous studies are completed as ordered Date Initiated: 12/11/2016 Target Resolution Date: 03/13/2017 Goal Status: Active Patient will maintain optimal edema control Date Initiated:  12/11/2016 Target Resolution Date: 03/13/2017 Goal Status: Active Patient/caregiver will verbalize understanding of disease process and disease management Date Initiated: 12/11/2016 Target Resolution Date: 03/13/2017 Goal Status: Active Verify adequate tissue perfusion prior to therapeutic compression application Date Initiated: 12/11/2016 Target Resolution Date: 03/13/2017 Goal Status: Active Interventions: Assess peripheral edema status every visit. Compression as ordered Provide education on venous insufficiency Treatment Activities: [name], [name] ([id number]) Therapeutic compression applied : 12/11/2016 Notes: ` Wound/Skin Impairment Nursing Diagnoses: Impaired tissue integrity Knowledge deficit related to ulceration/compromised skin integrity Goals: Patient/caregiver will verbalize understanding of skin care regimen Date Initiated: 12/11/2016 Target Resolution Date: 03/13/2017 Goal Status: Active Ulcer/skin breakdown will have a volume reduction of 30% by week 4 Date Initiated: 12/11/2016 Target Resolution Date: 03/13/2017 Goal Status: Active Ulcer/skin breakdown will have a volume reduction of 50% by week 8 Date Initiated: 12/11/2016 Target Resolution Date: 03/13/2017 Goal Status: Active Ulcer/skin breakdown will have a volume reduction of 80% by week 12 Date Initiated: 12/11/2016 Target Resolution Date: 03/13/2017 Goal Status: Active Ulcer/skin breakdown will heal within 14 weeks Date Initiated: 12/11/2016 Target Resolution Date: 03/13/2017 Goal Status: Active Interventions: Assess patient/caregiver ability to obtain necessary supplies Assess patient/caregiver ability to perform ulcer/skin care regimen upon admission and as needed Assess ulceration(s) every  visit Provide education on ulcer and skin care Treatment Activities: Skin care regimen initiated : 12/11/2016 Topical wound management initiated : 12/11/2016 Notes: Electronic Signature(s) Signed: 09/05/2017  3:07:03 PM By: Montey Hora Entered By: Montey Hora on 09/05/2017 08:35:27 [name] ([id number]) -------------------------------------------------------------------------------- Pain Assessment Details Patient Name: [name] Date of Service: 09/05/2017 8:00 AM Medical Record Number: [id number] Patient Account Number: [id number] Date of Birth/Sex: 1981/01/25 (36 y.o. M) Treating RN: Roger Shelter Primary Care Jarelis Ehlert: [name] Other Clinician: Referring Harrell Niehoff: [name] Treating Virdell Hoiland/Extender: Melburn Hake, [name]: 35 Active Problems Location of Pain Severity and Description of Pain Patient Has Paino No Site Locations Pain Management and Medication Current Pain Management: Electronic Signature(s) Signed: 09/08/2017 4:40:07 PM By: Roger Shelter Entered By: Roger Shelter on 09/05/2017 [name], [name] ([id number]) -------------------------------------------------------------------------------- Patient/Caregiver Education Details Patient Name: [name] Date of Service: 09/05/2017 8:00 AM Medical Record Number: [id number] Patient Account Number: [id number] Date of Birth/Gender: 1980/07/02 (36 y.o. M) Treating RN: Ahmed Prima Primary Care Physician: [name] Other Clinician: Referring Physician: [name] Treating Physician/Extender: Sharalyn Ink in Treatment: 52 Education Assessment Education Provided To: Patient Education Topics Provided Wound/Skin Impairment: Handouts: Caring for Your Ulcer, Other: change dressing as ordered Methods: Demonstration, Explain/Verbal Responses: State content correctly Electronic Signature(s) Signed: 09/05/2017 4:41:07 PM By: Alric Quan Entered By: Alric Quan on 09/05/2017 08:50:52 [name], [name] ([id number]) -------------------------------------------------------------------------------- Wound Assessment Details Patient Name: [name] Date of Service: 09/05/2017 8:00 AM Medical Record Number: [id number] Patient Account Number: [id number] Date of Birth/Sex: April 26, 1981 (36 y.o. M) Treating RN: Roger Shelter Primary Care Claudell Wohler: [name] Other Clinician: Referring Cailynn Bodnar: [name] Treating Donato Studley/Extender: Melburn Hake, [name]: 39 Wound Status Wound Number: 1 Primary Etiology: Pyoderma Wound Location: Left Lower Leg - Lateral Wound Status: Open Wounding Event: Gradually Appeared Comorbid History: Sleep Apnea, Hypertension, Colitis Date Acquired: 11/18/2015 Weeks Of Treatment: 39 Clustered Wound: No Photos Photo Uploaded By: Roger Shelter on 09/05/2017 16:03:43 Wound Measurements Length: (cm) 1.5 Width: (cm) 2 Depth: (cm) 0.3 Area: (cm) 2.356 Volume: (cm) 0.707 % Reduction in Area: 52% % Reduction in Volume: 82% Epithelialization: None Tunneling: No Undermining: No Wound Description Full Thickness Without Exposed Support Classification: Structures Wound Margin: Distinct, outline attached Exudate Medium Amount: Exudate Type: Serosanguineous Exudate Color: red, brown Foul Odor After Cleansing: No Slough/Fibrino Yes Wound Bed Granulation Amount: Small (1-33%) Exposed Structure Granulation Quality: Red Fascia Exposed: No Necrotic Amount: Large (67-100%) Fat Layer (Subcutaneous Tissue) Exposed: Yes Necrotic Quality: Adherent Slough Tendon Exposed: No Muscle Exposed: No Joint Exposed: No Bone Exposed: No [name], [name] ([id number]) Periwound Skin Texture Texture Color No Abnormalities Noted: No No Abnormalities Noted: No Callus: No Atrophie Blanche: No Crepitus: No Cyanosis: No Excoriation: No Ecchymosis: No Induration: Yes Erythema: Yes Rash: No Erythema Location: Circumferential Scarring: No Erythema Change: Increased Hemosiderin Staining: No Moisture Mottled: No No Abnormalities Noted: No Pallor: No Dry / Scaly: No Rubor:  No Maceration: No Temperature / Pain Temperature: No Abnormality Tenderness on Palpation: Yes Wound Preparation Ulcer Cleansing: Rinsed/Irrigated with Saline Topical Anesthetic Applied: None, Other: Lidocaine 4%, Treatment Notes Wound #1 (Left, Lateral Lower Leg) 1. Cleansed with: Clean wound with Normal Saline 2. Anesthetic Topical Lidocaine 4% cream to wound bed prior to debridement 4. Dressing Applied: Santyl Ointment 7. Secured with Tape Notes Manufacturing systems engineer) Signed: 09/08/2017 4:40:07 PM By: Roger Shelter Entered By: Roger Shelter on 09/05/2017 08:14:11 [name] ([id number]) -------------------------------------------------------------------------------- [place] Details Patient Name: [name] Date of Service: 09/05/2017  8:00 AM Medical Record Number: [id number] Patient Account Number: [id number] Date of Birth/Sex: [birth year] (36 y.o. M) Treating RN: Roger Shelter Primary Care Andrzej Scully: [name] Other Clinician: Referring Annlouise Gerety: [name] Treating Davonte Siebenaler/Extender: Melburn Hake, [name]: 52 Vital Signs Time Taken: 08:08 Temperature (F): 98.3 Height (in): 71 Pulse (bpm): 88 Weight (lbs): 338 Respiratory Rate (breaths/min): 18 Body Mass Index (BMI): 47.1 Blood Pressure (mmHg): 143/88 Reference Range: 80 - 120 mg / dl Electronic Signature(s) Signed: 09/08/2017 4:40:07 PM By: Roger Shelter Entered By: Roger Shelter on 09/05/2017 08:08:36

## 2017-09-19 ENCOUNTER — Encounter: Payer: 59 | Attending: Physician Assistant | Admitting: Physician Assistant

## 2017-09-19 DIAGNOSIS — I872 Venous insufficiency (chronic) (peripheral): Secondary | ICD-10-CM | POA: Diagnosis not present

## 2017-09-19 DIAGNOSIS — L97223 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sis of muscle: Secondary | ICD-10-CM | POA: Diagnosis not present

## 2017-09-19 DIAGNOSIS — L88 Pyoderma gangrenosum: Secondary | ICD-10-CM | POA: Insufficient documentation

## 2017-09-19 DIAGNOSIS — F17200 Nicotine dependence, unspecified, uncomplicated: Secondary | ICD-10-CM | POA: Diagnosis not present

## 2017-09-19 DIAGNOSIS — K5190 Ulcerative colitis, unspecified, without complications: Secondary | ICD-10-CM | POA: Diagnosis not present

## 2017-09-19 DIAGNOSIS — I10 Essential (primary) hypertension: Secondary | ICD-10-CM | POA: Insufficient documentation

## 2017-09-19 DIAGNOSIS — L97222 Non-pressure chronic ulcer of left calf with fat layer exposed: Secondary | ICD-10-CM | POA: Diagnosis not present

## 2017-09-23 ENCOUNTER — Other Ambulatory Visit: Payer: Self-pay | Admitting: Primary Care

## 2017-09-23 DIAGNOSIS — F3342 Major depressive disorder, recurrent, in full remission: Secondary | ICD-10-CM

## 2017-09-25 NOTE — Progress Notes (Signed)
[name], [name] ([id number]) Visit Report for 09/19/2017 Chief Complaint Document Details Patient Name: [name], [name] Date of Service: 09/19/2017 8:00 AM Medical Record Number: [id number] Patient Account Number: [id number] Date of Birth/Sex: October 31, 1980 (37 y.o. M) Treating RN: Montey Hora Primary Care Provider: [name] Other Clinician: Referring Provider: [name] Treating Provider/Extender: Melburn Hake, Colie Fugitt Weeks in Treatment: 38 Information Obtained from: Patient Chief Complaint He is here in follow up evaluation for LLE pyoderma ulcer Electronic Signature(s) Signed: 09/23/2017 5:56:33 PM By: Worthy Keeler PA-C Entered By: Worthy Keeler on 09/19/2017 08:25:52 [name] ([id number]) -------------------------------------------------------------------------------- Debridement Details Patient Name: [name] Date of Service: 09/19/2017 8:00 AM Medical Record Number: [id number] Patient Account Number: [id number] Date of Birth/Sex: May 09, 1981 (37 y.o. M) Treating RN: Montey Hora Primary Care Provider: [name] Other Clinician: Referring Provider: [name] Treating Provider/Extender: Melburn Hake, Ayaat Jansma Weeks in Treatment: 80 Debridement Performed for Wound #1 Left,Lateral Lower Leg Assessment: Performed By: Physician STONE III, Whitney Bingaman E., PA-C Debridement Type: Chemical/Enzymatic/Mechanical Agent Used: Santyl Pre-procedure Verification/Time Yes - 08:33 Out Taken: Start Time: 08:33 Pain Control: Lidocaine 4% Topical Solution Instrument: Other : santyl with tongue depressor Bleeding: None End Time: 08:35 Procedural Pain: 0 Post Procedural Pain: 0 Response to Treatment: Procedure was tolerated well Post Debridement Measurements of Total Wound Length: (cm) 6.2 Width: (cm) 6 Depth: (cm) 0.2 Volume: (cm) 5.843 Character of Wound/Ulcer Post Debridement: Improved Post Procedure Diagnosis Same as Pre-procedure Electronic Signature(s) Signed:  09/19/2017 4:48:08 PM By: Montey Hora Signed: 09/23/2017 5:56:33 PM By: Worthy Keeler PA-C Entered By: Montey Hora on 09/19/2017 08:36:16 [name], [name] ([id number]) -------------------------------------------------------------------------------- HPI Details Patient Name: [name] Date of Service: 09/19/2017 8:00 AM Medical Record Number: [id number] Patient Account Number: [id number] Date of Birth/Sex: 11/29/1980 (37 y.o. M) Treating RN: Montey Hora Primary Care Provider: [name] Other Clinician: Referring Provider: [name] Treating Provider/Extender: Melburn Hake, Zayan Delvecchio Weeks in Treatment: 88 History of Present Illness HPI Description: 12/04/16; 37 year old man who comes into the clinic today for review of a wound on the posterior left calf. He tells me that is been there for about a year. He is not a diabetic he does smoke half a pack per day. He was seen in the ER on 11/20/16 felt to have cellulitis around the wound and was given clindamycin. An x-ray did not show osteomyelitis. The patient initially tells me that he has a milk allergy that sets off a pruritic itching rash on his lower legs which she scratches incessantly and he thinks that's what may have set up the wound. He has been using various topical antibiotics and ointments without any effect. He works in a [employer]ing [employer] and is on his feet all day. He does not have a prior history of wounds however he does have the rash on both lower legs the right arm and the ventral aspect of his left arm. These are excoriations and clearly have had scratching however there are of macular looking areas on both legs including a substantial larger area on the right leg. This does not have an underlying open area. There is no blistering. The patient tells me that 2 years ago in [place] in response to the rash on his legs he saw a dermatologist who told him he had a condition which may be pyoderma gangrenosum although I may be putting  words into his mouth. He seemed to recognize this. 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 He is not in any  treatment for this. He's had no recent travel 12/11/16; the patient arrives today with his wound and roughly the same condition we've been using silver alginate this is a deep punched out wound with some surrounding erythema but no tenderness. Biopsy I did did not show confirmed pyoderma gangrenosum suggested nonspecific inflammation and vasculitis but does not provide an actual description of what was seen by the pathologist. I'm really not able to understand this We have also received information from the patient's dermatologist in [place] notes from April 2016. This was a doctor Agarwal- antal. 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 He was prescribed topical steroid high potency under occlusion which helped but at this point the patient did not have a deep punched out wound. 12/18/16; the patient's wound is larger in terms of surface area however this surface looks better and there is less depth. The surrounding erythema also is better. The patient states that the wrap we put on came off 2 days ago when he has been using his compression stockings. He we are in the process of getting a dermatology consult. 12/26/16 on evaluation today patient's left lower extremity wound shows evidence of infection with surrounding erythema noted. He has been tolerating the dressing changes but states that he has noted more discomfort. There is a larger area of erythema surrounding the wound. No fevers, chills, nausea, or vomiting noted at this time. With that being said the wound still does have slough covering the surface. He is not allergic to any medication that he is aware of at this point. In regard to his right lower extremity he had several regions that are erythematous and pruritic he wonders if there's anything we can do to  help that. 01/02/17 I reviewed patient's wound culture which was obtained his visit last week. He was placed on doxycycline at that point. Unfortunately that does not appear to be an antibiotic that would likely help with the situation however the pseudomonas noted on culture is sensitive to Cipro. Also unfortunately patient's wound seems to have a large compared to last week's evaluation. Not severely so but there are definitely increased measurements in general. He is continuing to have discomfort as well he writes this to be a seven out of 10. In fact he would prefer me not to perform any debridement today due to the fact that he is having discomfort and considering he has an active infection on the little reluctant to do so anyway. No fevers, chills, nausea, or vomiting noted at this time. 01/08/17; patient seems dermatology on September 5. I suspect dermatology will want the slides from the biopsy I did sent to their pathologist. I'm not sure if there is a way we can expedite that. In any case the culture I did before I left on vacation 3 weeks ago showed Pseudomonas he was given 10 days of Cipro and per her description of her intake nurses is actually somewhat better this week although the wound is quite a bit bigger than I remember the last time I saw this. He still has 3 more days of Cipro [name], [name] ([id number]) 01/21/17; dermatology appointment tomorrow. He has completed the ciprofloxacin for Pseudomonas. Surface of the wound looks better however he is had some deterioration in the lesions on his right leg. Meantime the left lateral leg wound we will continue with sample 01/29/17; patient had his dermatology appointment but I can't yet see that note. He is completed his antibiotics. The wound is more superficial but  considerably larger in circumferential area than when he came in. This is in his left lateral calf. He also has swollen erythematous areas with superficial wounds on the right  leg and small papular areas on both arms. There apparently areas in her his upper thighs and buttocks I did not look at those. Dermatology biopsied the right leg. Hopefully will have their input next week. 02/05/17; patient went back to see his dermatologist who told him that he had a "scratching problem" as well as staph. He is now on a 30 day course of doxycycline and I believe she gave him triamcinolone cream to the right leg areas to help with the itching [not exactly sure but probably triamcinolone]. She apparently looked at the left lateral leg wound although this was not rebiopsied and I think felt to be ultimately part of the same pathogenesis. He is using sample border foam and changing nevus himself. He now has a new open area on the right posterior leg which was his biopsy site I don't have any of the dermatology notes 02/12/17; we put the patient in compression last week with SANTYL to the wound on the left leg and the biopsy. Edema is much better and the depth of the wound is now at level of skin. Area is still the same oBiopsy site on the right lateral leg we've also been using santyl with a border foam dressing and he is changing this himself. 02/19/17; Using silver alginate started last week to both the substantial left leg wound and the biopsy site on the right wound. He is tolerating compression well. Has a an appointment with his primary M.D. tomorrow wondering about diuretics although I'm wondering if the edema problem is actually lymphedema 02/26/17; the patient has been to see his primary doctor Dr. Jerrel Ivory at [facility] our primary care. She started him on Lasix 20 mg and this seems to have helped with the edema. However we are not making substantial change with the left lateral calf wound and inflammation. The biopsy site on the right leg also looks stable but not really all that different. 03/12/17; the patient has been to see vein and vascular Dr. Lucky Cowboy. He has had venous  reflux studies I have not reviewed these. I did get a call from his dermatology office. 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 They therefore recommended clobetasol and tetracycline i.e. 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 03/26/17; apparently the patient just had reflux studies not an appointment with Dr. dew. She arrives in clinic today having applied clobetasol for 2-3 weeks. He notes over the last 2-3 days excessive drainage having to change the dressing 3-4 times a day and also expanding erythema. 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 Lillia Carmel at [facility] dermatology locally who kindly saw him at my request. 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 My concern was the degree of erythema medially extending from his  left leg wound which was either inflammation from pyoderma or cellulitis. I put him on Augmentin however culture of the wound showed Pseudomonas which is quinolone sensitive. I really don't believe he has cellulitis however in view of everything I will continue and give him a course of Cipro. He is also on doxycycline as an immune modulator for the pyoderma. In addition to his original wound on the left lateral leg with surrounding erythema he has a wound on the right posterior calf which was an original biopsy site done by dermatology. This was felt to represent pathergy from  pyoderma gangrenosum 04/16/17; pyoderma gangrenosum. Saw Dr. Lillia Carmel yesterday. He has been using topical antibiotics to both wound areas his original wound on the left and the biopsies/pathergy area on the right. There is definitely some improvement in the inflammation around the wound on the right although the patient states he has increasing sensitivity of the wounds. He is on prednisone 60 and doxycycline 1 as prescribed by Dr. Lillia Carmel. He is covering the topical antibiotic with gauze and putting this in his own compression stocks and changing this daily. He states that Dr. Lottie Rater did a culture of the left leg wound yesterday 05/07/17; pyoderma gangrenosum. The patient saw Dr. Lillia Carmel yesterday and has a follow-up with her in one month. He is still using topical antibiotics to both wounds although he can't recall exactly what type. He is still on prednisone 60 mg. Dr. Lillia Carmel stated that the doxycycline could stop if we were in agreement. 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 The right medial leg was induced by biopsy/pathergy. The area on the right is essentially healed. 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 The right medial leg has closed and remains closed. He is still on prednisone 60. oHe tells me he missed his last dermatology appointment with Dr. Lillia Carmel but will make another appointment. He reports that her blood sugar at a recent screen in [place] was high 200's. He was 180 today. He is more cushingoid blood pressure is Gallego, Theon ([id number]) up a bit. I think he is going to require still much longer prednisone perhaps another 3 months before attempting to taper. In the meantime his wound is a lot better. Smaller. He is cleaning this off daily and applying  topical antibiotics. When he was last in the clinic I thought about changing to [facility] and actually put in a couple of calls to dermatology although probably not during their business hours. 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 He continues on oral prednisone. He has been using triple antibiotic ointment. There is surface debris and we will transition to [facility] and have him return in 2 weeks. He has lost 30 pounds since his last appointment with lifestyle modification. He may benefit from topical steroid cream for treatment this can be considered at a later date. 08/22/17 on evaluation today patient appears to actually be doing rather well in regard to his left lateral lower extremity ulcer. He has actually been managed by Dr. Dellia Nims most recently. Patient is currently on oral steroids at this time. This seems to have been of benefit for him. Nonetheless his last visit was actually with  Leah on 08/06/17. Currently he is not utilizing any topical steroid creams although this could be of benefit as well. No fevers, chills, nausea, or vomiting noted at this time. 09/05/17 on evaluation today patient appears to be doing better in regard to his left lateral lower extremity ulcer. He has been tolerating the dressing changes without complication. He is using Santyl with good effect. Overall I'm very pleased with how things are standing at this point. Patient likewise is happy that this is doing better. 09/19/17 on evaluation today patient actually appears to be doing rather well in regard to his left lateral lower extremity ulcer. 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 He's not having any significant pain. Electronic Signature(s) Signed: 09/23/2017 5:56:33 PM By: Worthy Keeler PA-C Entered By:  Worthy Keeler on 09/19/2017 10:20:11 KENRIC, GINGER ([id number]) -------------------------------------------------------------------------------- Physical Exam Details Patient Name: [name] Date of Service: 09/19/2017 8:00 AM Medical Record Number: [id number] Patient Account Number: [id number] Date of Birth/Sex: 07-28-1980 (36 y.o. M) Treating RN: Montey Hora Primary Care Provider: [name] Other Clinician: Referring Provider: [name] Treating Provider/Extender: Melburn Hake, Berdell Nevitt Weeks in Treatment: 56 Constitutional Well-nourished and well-hydrated in no acute distress. Respiratory normal breathing without difficulty. clear to auscultation bilaterally. Cardiovascular regular rate and rhythm with normal S1, S2. Psychiatric this patient is able to make decisions and demonstrates good insight into disease process. Alert and Oriented x 3. pleasant and cooperative. Notes Patients were did have [place] noted on the surface of the wound bed this was actually mechanically debrided away with saline goals and instruct agreement performed today. He tolerated this without any discomfort. Electronic Signature(s) Signed: 09/23/2017 5:56:33 PM By: Worthy Keeler PA-C Entered By: Worthy Keeler on 09/19/2017 10:21:41 [name], [name] ([id number]) -------------------------------------------------------------------------------- Physician Orders Details Patient Name: [name] Date of Service: 09/19/2017 8:00 AM Medical Record Number: [id number] Patient Account Number: [id number] Date of Birth/Sex: Mar 22, 1981 (36 y.o. M) Treating RN: Montey Hora Primary Care Provider: [name] Other Clinician: Referring Provider: [name] Treating Provider/Extender: Melburn Hake, Vernis Eid Weeks in Treatment: 73 Verbal / Phone Orders: No Diagnosis Coding ICD-10 Coding Code Description [phone] 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sis of muscle L88 Pyoderma  gangrenosum I87.2 Venous insufficiency (chronic) (peripheral) Wound Cleansing Wound #1 Left,Lateral Lower Leg o Clean wound with Normal Saline. Anesthetic (add to Medication List) Wound #1 Left,Lateral Lower Leg o Topical Lidocaine 4% cream applied to wound bed prior to debridement (In Clinic Only). Primary Wound Dressing Wound #1 Left,Lateral Lower Leg o Santyl Ointment Secondary Dressing Wound #1 Left,Lateral Lower Leg o XtraSorb - secure with tape Dressing Change Frequency Wound #1 Left,Lateral Lower Leg o Change dressing every day. Follow-up Appointments Wound #1 Left,Lateral Lower Leg o Return Appointment in 2 weeks. Edema Control Wound #1 Left,Lateral Lower Leg o Patient to wear own compression stockings Electronic Signature(s) Signed: 09/19/2017 4:48:08 PM By: Montey Hora Signed: 09/23/2017 5:56:33 PM By: Worthy Keeler PA-C Entered By: Montey Hora on 09/19/2017 08:31:00 [name], [name] ([id number]) [name], [name] ([id number]) -------------------------------------------------------------------------------- Problem List Details Patient Name: [name] Date of Service: 09/19/2017 8:00 AM Medical Record Number: [id number] Patient Account Number: [id number] Date of Birth/Sex: [birth year] (37 y.o. M) Treating RN: Montey Hora Primary Care Provider: [name] Other Clinician: Referring Provider: [name] Treating Provider/Extender: Melburn Hake, Meilah Delrosario Weeks in Treatment: 68 Active Problems ICD-10 Impacting Encounter Code Description Active Date Wound Healing Diagnosis L97.223 Non-pressure chronic  ulcer of left calf with necrosis of muscle 12/04/2016 Yes L88 Pyoderma gangrenosum 03/26/2017 Yes I87.2 Venous insufficiency (chronic) (peripheral) 12/04/2016 Yes Inactive Problems ICD-10 Code Description Active Date Inactive Date L97.213 Non-pressure chronic ulcer of right calf with necrosis of muscle 04/02/2017 04/02/2017 Resolved  Problems Electronic Signature(s) Signed: 09/23/2017 5:56:33 PM By: Worthy Keeler PA-C Entered By: Worthy Keeler on 09/19/2017 08:25:45 [name], [name] ([id number]) -------------------------------------------------------------------------------- Progress Note Details Patient Name: [name] Date of Service: 09/19/2017 8:00 AM Medical Record Number: [id number] Patient Account Number: [id number] Date of Birth/Sex: [birth year] (36 y.o. M) Treating RN: Montey Hora Primary Care Provider: [name] Other Clinician: Referring Provider: [name] Treating Provider/Extender: Melburn Hake, Quinette Hentges Weeks in Treatment: 41 Subjective Chief Complaint Information obtained from Patient He is here in follow up evaluation for LLE pyoderma ulcer History of Present Illness (HPI) 12/04/16; 37 year old man who comes into the clinic today for review of a wound on the posterior left calf. He tells me that is been there for about a year. He is not a diabetic he does smoke half a pack per day. He was seen in the ER on 11/20/16 felt to have cellulitis around the wound and was given clindamycin. An x-ray did not show osteomyelitis. The patient initially tells me that he has a milk allergy that sets off a pruritic itching rash on his lower legs which she scratches incessantly and he thinks that's what may have set up the wound. He has been using various topical antibiotics and ointments without any effect. He works in a [employer]ing [employer] and is on his feet all day. He does not have a prior history of wounds however he does have the rash on both lower legs the right arm and the ventral aspect of his left arm. These are excoriations and clearly have had scratching however there are of macular looking areas on both legs including a substantial larger area on the right leg. This does not have an underlying open area. There is no blistering. The patient tells me that 2 years ago in [place] in response to the rash on his  legs he saw a dermatologist who told him he had a condition which may be pyoderma gangrenosum although I may be putting words into his mouth. He seemed to recognize this. 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 He is not in any treatment for this. He's had no recent travel 12/11/16; the patient arrives today with his wound and roughly the same condition we've been using silver alginate this is a deep punched out wound with some surrounding erythema but no tenderness. Biopsy I did did not show confirmed pyoderma gangrenosum suggested nonspecific inflammation and vasculitis but does not provide an actual description of what was seen by the pathologist. I'm really not able to understand this We have also received information from the patient's dermatologist in [place] notes from April 2016. This was a doctor Agarwal- antal. 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 He was prescribed topical steroid high potency under occlusion which helped but at this point the patient did not have a deep punched out wound. 12/18/16; the patient's wound is larger in terms of surface area however this surface looks better and there is less depth. The surrounding erythema also is better. The patient states that the wrap we put on came off 2 days ago when he has been using his compression stockings. He we are in the process of getting a  dermatology consult. 12/26/16 on evaluation today patient's left lower extremity wound shows evidence of infection with surrounding erythema noted. He has been tolerating the dressing changes but states that he has noted more discomfort. There is a larger area of erythema surrounding the wound. No fevers, chills, nausea, or vomiting noted at this time. With that being said the wound still does have slough covering the surface. He is not allergic to any medication that he is aware of at this point. In regard to his right lower  extremity he had several regions that are erythematous and pruritic he wonders if there's anything we can do to help that. 01/02/17 I reviewed patient's wound culture which was obtained his visit last week. He was placed on doxycycline at that point. Unfortunately that does not appear to be an antibiotic that would likely help with the situation however the pseudomonas noted on culture is sensitive to Cipro. Also unfortunately patient's wound seems to have a large compared to last week's evaluation. Not severely so but there are definitely increased measurements in general. He is continuing to have discomfort as well he writes this to be a seven out of 10. In fact he would prefer me not to perform any debridement today due to the fact that he is having discomfort and considering he has an active infection on the little reluctant to do so anyway. No fevers, [name], [name] ([id number]) chills, nausea, or vomiting noted at this time. 01/08/17; patient seems dermatology on September 5. I suspect dermatology will want the slides from the biopsy I did sent to their pathologist. I'm not sure if there is a way we can expedite that. In any case the culture I did before I left on vacation 3 weeks ago showed Pseudomonas he was given 10 days of Cipro and per her description of her intake nurses is actually somewhat better this week although the wound is quite a bit bigger than I remember the last time I saw this. He still has 3 more days of Cipro 01/21/17; dermatology appointment tomorrow. He has completed the ciprofloxacin for Pseudomonas. Surface of the wound looks better however he is had some deterioration in the lesions on his right leg. Meantime the left lateral leg wound we will continue with sample 01/29/17; patient had his dermatology appointment but I can't yet see that note. He is completed his antibiotics. The wound is more superficial but considerably larger in circumferential area than when he came in.  This is in his left lateral calf. He also has swollen erythematous areas with superficial wounds on the right leg and small papular areas on both arms. There apparently areas in her his upper thighs and buttocks I did not look at those. Dermatology biopsied the right leg. Hopefully will have their input next week. 02/05/17; patient went back to see his dermatologist who told him that he had a "scratching problem" as well as staph. He is now on a 30 day course of doxycycline and I believe she gave him triamcinolone cream to the right leg areas to help with the itching [not exactly sure but probably triamcinolone]. She apparently looked at the left lateral leg wound although this was not rebiopsied and I think felt to be ultimately part of the same pathogenesis. He is using sample border foam and changing nevus himself. He now has a new open area on the right posterior leg which was his biopsy site I don't have any of the dermatology notes 02/12/17; we put the patient in  compression last week with SANTYL to the wound on the left leg and the biopsy. Edema is much better and the depth of the wound is now at level of skin. Area is still the same Biopsy site on the right lateral leg we've also been using santyl with a border foam dressing and he is changing this himself. 02/19/17; Using silver alginate started last week to both the substantial left leg wound and the biopsy site on the right wound. He is tolerating compression well. Has a an appointment with his primary M.D. tomorrow wondering about diuretics although I'm wondering if the edema problem is actually lymphedema 02/26/17; the patient has been to see his primary doctor Dr. Jerrel Ivory at [place] our primary care. She started him on Lasix 20 mg and this seems to have helped with the edema. However we are not making substantial change with the left lateral calf wound and inflammation. The biopsy site on the right leg also looks stable but not  really all that different. 03/12/17; the patient has been to see vein and vascular Dr. Lucky Cowboy. He has had venous reflux studies I have not reviewed these. I did get a call from his dermatology office. 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 They therefore recommended clobetasol and tetracycline i.e. 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 03/26/17; apparently the patient just had reflux studies not an appointment with Dr. dew. She arrives in clinic today having applied clobetasol for 2-3 weeks. He notes over the last 2-3 days excessive drainage having to change the dressing 3-4 times a day and also expanding erythema. 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 Lillia Carmel at [facility] dermatology locally who kindly saw him at my request. 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 My concern was the degree of erythema medially extending from his left leg wound which was either inflammation from pyoderma or cellulitis. I put him on Augmentin however culture of the wound showed Pseudomonas which is quinolone sensitive. I really don't believe he has cellulitis however in view of everything I will continue and give him a course of Cipro. He is also on doxycycline as an immune modulator for the pyoderma. In addition to his original wound on the left lateral leg with surrounding erythema he has a wound on the right  posterior calf which was an original biopsy site done by dermatology. 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 Saw Dr. Lillia Carmel yesterday. He has been using topical antibiotics to both wound areas his original wound on the left and the biopsies/pathergy area on the right. There is definitely some improvement in the inflammation around the wound on the right although the patient states he has increasing sensitivity of the wounds. He is on prednisone 60 and doxycycline 1 as prescribed by Dr. Lillia Carmel. He is covering the topical antibiotic with gauze and putting this in his own compression stocks and  changing this daily. He states that Dr. Lottie Rater did a culture of the left leg wound yesterday 05/07/17; pyoderma gangrenosum. The patient saw Dr. Lillia Carmel yesterday and has a follow-up with her in one month. He is still using topical antibiotics to both wounds although he can't recall exactly what type. He is still on prednisone 60 mg. Dr. Lillia Carmel stated that the doxycycline could stop if we were in agreement. 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 The right medial leg was induced by [name] ([id number]) biopsy/pathergy. The area on the right is essentially healed. 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 The right medial leg has closed and remains closed. He is still on prednisone 60. He tells me he missed his last dermatology appointment with Dr. Lillia Carmel but will make another appointment. He reports that her blood sugar at a recent screen in [place] was high 200's. He was 180 today. 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 I think he is going to require still much longer prednisone perhaps another 3 months before  attempting to taper. In the meantime his wound is a lot better. Smaller. He is cleaning this off daily and applying topical antibiotics. When he was last in the clinic I thought about changing to [facility] and actually put in a couple of calls to dermatology although probably not during their business hours. 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 He continues on oral prednisone. He has been using triple antibiotic ointment. There is surface debris and we will transition to [facility] and have him return in 2 weeks. He has lost 30 pounds since his last appointment with lifestyle modification. He may benefit from topical steroid cream for treatment this can be considered at a later date. 08/22/17 on evaluation today patient appears to actually be doing rather well in regard to his left lateral lower extremity ulcer. He has actually been managed by Dr. Dellia Nims most recently. Patient is currently on oral steroids at this time. This seems to have been of benefit for him. Nonetheless his last visit was actually with Leah on 08/06/17. Currently he is not utilizing any topical steroid creams although this could be of benefit as well. No fevers, chills, nausea, or vomiting noted at this time. 09/05/17 on evaluation today patient appears to be doing better in regard to his left lateral lower extremity ulcer. He has been tolerating the dressing changes without complication. He is using Santyl with good effect. Overall I'm very pleased with how things are standing at this point. Patient likewise is happy that this is doing better. 09/19/17 on evaluation today patient actually appears to be doing rather well in regard to his left lateral lower extremity ulcer. 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 He's not having  any significant pain. Patient History Information obtained from Patient. Family History Diabetes - Mother,Maternal Grandparents, Heart Disease - Mother,Maternal Grandparents, Hereditary Spherocytosis - Siblings, Hypertension - Maternal Grandparents, Stroke - Siblings, No family history of Cancer, Kidney Disease, Lung Disease, Seizures, Thyroid Problems, Tuberculosis. Social History Current some day smoker, Marital Status - Married, Alcohol Use -  Rarely, Drug Use - No History, Caffeine Use - Moderate. Review of Systems (ROS) Constitutional Symptoms (General Health) Denies complaints or symptoms of Fever, Chills. Respiratory The patient has no complaints or symptoms. Cardiovascular The patient has no complaints or symptoms. Psychiatric The patient has no complaints or symptoms. Objective [name], [name] ([id number]) Constitutional Well-nourished and well-hydrated in no acute distress. Vitals Time Taken: 8:11 AM, Height: 71 in, Weight: 338 lbs, BMI: 47.1, Temperature: 98.1 F, Pulse: 71 bpm, Respiratory Rate: 16 breaths/min, Blood Pressure: 140/84 mmHg. Respiratory normal breathing without difficulty. clear to auscultation bilaterally. Cardiovascular regular rate and rhythm with normal S1, S2. Psychiatric this patient is able to make decisions and demonstrates good insight into disease process. Alert and Oriented x 3. pleasant and cooperative. General Notes: Patients were did have [place] noted on the surface of the wound bed this was actually mechanically debrided away with saline goals and instruct agreement performed today. He tolerated this without any discomfort. Integumentary (Hair, Skin) Wound #1 status is Open. Original cause of wound was Gradually Appeared. The wound is located on the Left,Lateral Lower Leg. The wound measures 6.2cm length x 6cm width x 0.2cm depth; 29.217cm^2 area and 5.843cm^3 volume. There is Fat Layer (Subcutaneous Tissue) Exposed exposed. There is no  tunneling or undermining noted. There is a medium amount of serosanguineous drainage noted. The wound margin is distinct with the outline attached to the wound base. There is small (1-33%) red granulation within the wound bed. There is a large (67-100%) amount of necrotic tissue within the wound bed including Adherent Slough. The periwound skin appearance exhibited: Induration, Hemosiderin Staining, Erythema. The periwound skin appearance did not exhibit: Callus, Crepitus, Excoriation, Rash, Scarring, Dry/Scaly, Maceration, Atrophie Blanche, Cyanosis, Ecchymosis, Mottled, Pallor, Rubor. The surrounding wound skin color is noted with erythema with red streaks. Periwound temperature was noted as No Abnormality. The periwound has tenderness on palpation. 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 There was a Chemical/Enzymatic/Mechanical debridement performed by STONE III, Lashawne Dura E., PA-C. With the following instrument(s): santyl with tongue depressor. after achieving pain control using Lidocaine 4% Topical Solution. Agent used was [employer]. A time out was conducted at 08:33, prior to the start of the procedure. There was no bleeding. The procedure was tolerated well with a pain level of 0 throughout and a pain level of 0 following the procedure. Post Debridement Measurements: 6.2cm [name], [name] ([id number]) length x 6cm width x 0.2cm depth; 5.843cm^3 volume. Character of Wound/Ulcer Post Debridement is improved. Post procedure Diagnosis Wound #1: Same as Pre-Procedure Plan Wound Cleansing: Wound #1 Left,Lateral Lower Leg: Clean wound with Normal Saline. Anesthetic (add to Medication List): Wound #1 Left,Lateral Lower Leg: Topical Lidocaine 4%  cream applied to wound bed prior to debridement (In Clinic Only). Primary Wound Dressing: Wound #1 Left,Lateral Lower Leg: Santyl Ointment Secondary Dressing: Wound #1 Left,Lateral Lower Leg: XtraSorb - secure with tape Dressing Change Frequency: Wound #1 Left,Lateral Lower Leg: Change dressing every day. Follow-up Appointments: Wound #1 Left,Lateral Lower Leg: Return Appointment in 2 weeks. Edema Control: Wound #1 Left,Lateral Lower Leg: Patient to wear own compression stockings I'm going to recommend that we continue with the Current wound care measures for the next week. Patient is in agreement with the plan unfortunately seems to be making good progress. If anything  changes he will let me know. Please see above for specific wound care orders. We will see patient for re-evaluation in 1 week(s) here in the clinic. If anything worsens or changes patient will contact our office for additional recommendations. Electronic Signature(s) Signed: 09/23/2017 5:56:33 PM By: Worthy Keeler PA-C Entered By: Worthy Keeler on 09/19/2017 10:22:05 [name], KRAKOWSKI ([id number]) -------------------------------------------------------------------------------- ROS/PFSH Details Patient Name: [name] Date of Service: 09/19/2017 8:00 AM Medical Record Number: [id number] Patient Account Number: [id number] Date of Birth/Sex: 09-Mar-1981 (36 y.o. M) Treating RN: Montey Hora Primary Care Provider: [name] Other Clinician: Referring Provider: [name] Treating Provider/Extender: Melburn Hake, Hermena Swint Weeks in Treatment: 38 Information Obtained From Patient Wound History Do you currently have one or more open woundso Yes How many open wounds do you currently haveo 1 Approximately how long have you had your woundso 1 year How have you been treating your wound(s) until nowo neosporin Has your wound(s) ever healed and then re-openedo No Have you had any lab work done in the past montho  No Have you tested positive for an antibiotic resistant organism (MRSA, VRE)o No Have you tested positive for osteomyelitis (bone infection)o No Have you had any tests for circulation on your legso No Have you had other problems associated with your woundso Infection, Swelling Constitutional Symptoms (General Health) Complaints and Symptoms: Negative for: Fever; Chills Eyes Medical History: Negative for: Cataracts; Glaucoma; Optic Neuritis Ear/Nose/Mouth/Throat Medical History: Negative for: Chronic sinus problems/congestion; Middle ear problems Hematologic/Lymphatic Medical History: Negative for: Anemia; Hemophilia; Human Immunodeficiency Virus; Lymphedema; Sickle Cell Disease Respiratory Complaints and Symptoms: No Complaints or Symptoms Medical History: Positive for: Sleep Apnea - cpap Negative for: Aspiration; Asthma; Chronic Obstructive Pulmonary Disease (COPD); Pneumothorax Cardiovascular Complaints and Symptoms: No Complaints or Symptoms Medical History: Positive for: Hypertension Helbing, Jakie ([id number]) Negative for: Angina; Arrhythmia; Congestive Heart Failure; Coronary Artery Disease; Deep Vein Thrombosis; Hypotension; Myocardial Infarction; Peripheral Arterial Disease; Peripheral Venous Disease; Phlebitis; Vasculitis Gastrointestinal Medical History: Positive for: Colitis Endocrine Medical History: Negative for: Type I Diabetes; Type II Diabetes Genitourinary Medical History: Negative for: End Stage Renal Disease Immunological Medical History: Negative for: Lupus Erythematosus; Raynaudos; Scleroderma Integumentary (Skin) Medical History: Negative for: History of Burn; History of pressure wounds Musculoskeletal Medical History: Negative for: Gout; Rheumatoid Arthritis; Osteoarthritis; Osteomyelitis Neurologic Medical History: Negative for: Dementia; Neuropathy; Quadriplegia; Paraplegia; Seizure Disorder Oncologic Medical History: Negative for:  Received Chemotherapy; Received Radiation Psychiatric Complaints and Symptoms: No Complaints or Symptoms Medical History: Negative for: Anorexia/bulimia; Confinement Anxiety Immunizations Pneumococcal Vaccine: Received Pneumococcal Vaccination: No Implantable Devices Family and Social History ELVERT, CUMPTON ([id number]) Cancer: No; Diabetes: Yes - Mother,Maternal Grandparents; Heart Disease: Yes - Mother,Maternal Grandparents; Hereditary Spherocytosis: Yes - Siblings; Hypertension: Yes - Maternal Grandparents; Kidney Disease: No; Lung Disease: No; Seizures: No; Stroke: Yes - Siblings; Thyroid Problems: No; Tuberculosis: No; Current some day smoker; Marital Status - Married; Alcohol Use: Rarely; Drug Use: No History; Caffeine Use: Moderate; Financial Concerns: No; Food, Clothing or Shelter Needs: No; Support System Lacking: No; Transportation Concerns: No; Advanced Directives: No; Patient does not want information on Advanced Directives; Living Will: No Physician Affirmation I have reviewed and agree with the above information. Electronic Signature(s) Signed: 09/19/2017 4:48:08 PM By: Montey Hora Signed: 09/23/2017 5:56:33 PM By: Worthy Keeler PA-C Entered By: Worthy Keeler on 09/19/2017 10:21:22 DAIQUAN, RESNIK ([id number]) -------------------------------------------------------------------------------- SuperBill Details Patient Name: [name] Date of Service: 09/19/2017 Medical Record Number: [id number] Patient Account Number: [id number] Date of Birth/Sex: [birth year] (36 y.o. M) Treating  RN: Montey Hora Primary Care Provider: [name] Other Clinician: Referring Provider: [name] Treating Provider/Extender: Melburn Hake, Danelle Curiale Weeks in Treatment: 49 Diagnosis Coding ICD-10 Codes Code Description [phone] 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sis of muscle L88 Pyoderma gangrenosum I87.2 Venous insufficiency (chronic) (peripheral) Facility  Procedures CPT4 Code: [id number] Description: 69678 - DEBRIDE W/O ANES NON SELECT Modifier: Quantity: 1 Physician Procedures CPT4 Code: [id number] Description: 51025 - WC PHYS LEVEL 3 - EST PT ICD-10 Diagnosis Description L97.223 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sis of m L88 Pyoderma gangrenosum I87.2 Venous insufficiency (chronic) (peripheral) Modifier: uscle Quantity: 1 Electronic Signature(s) Signed: 09/23/2017 5:56:33 PM By: Worthy Keeler PA-C Entered By: Worthy Keeler on 09/19/2017 10:22:17

## 2017-10-03 ENCOUNTER — Encounter: Payer: 59 | Admitting: Physician Assistant

## 2017-10-03 DIAGNOSIS — I872 Venous insufficiency (chronic) (peripheral): Secondary | ICD-10-CM | POA: Diagnosis not present

## 2017-10-03 DIAGNOSIS — L88 Pyoderma gangrenosum: Secondary | ICD-10-CM | POA: Diagnosis not present

## 2017-10-03 DIAGNOSIS — L97222 Non-pressure chronic ulcer of left calf with fat layer exposed: Secondary | ICD-10-CM | POA: Diagnosis not present

## 2017-10-07 NOTE — Progress Notes (Signed)
JAVONTAY, VANDAM ([id number]) Visit Report for 10/03/2017 Chief Complaint Document Details Patient Name: [name] Date of Service: 10/03/2017 8:30 AM Medical Record Number: [id number] Patient Account Number: [id number] Date of Birth/Sex: [birth year] (37 y.o. M) Treating RN: Montey Hora Primary Care Provider: [name] Other Clinician: Referring Provider: [name] Treating Provider/Extender: Melburn Hake, Audyn Dimercurio Weeks in Treatment: 35 Information Obtained from: Patient Chief Complaint He is here in follow up evaluation for LLE pyoderma ulcer Electronic Signature(s) Signed: 10/04/2017 1:18:07 PM By: Worthy Keeler PA-C Entered By: Worthy Keeler on 10/03/2017 08:37:02 [name] ([id number]) -------------------------------------------------------------------------------- Debridement Details Patient Name: [name] Date of Service: 10/03/2017 8:30 AM Medical Record Number: [id number] Patient Account Number: [id number] Date of Birth/Sex: 1980-06-12 (36 y.o. M) Treating RN: Montey Hora Primary Care Provider: [name] Other Clinician: Referring Provider: [name] Treating Provider/Extender: Melburn Hake, Roshonda Sperl Weeks in Treatment: 58 Debridement Performed for Wound #1 Left,Lateral Lower Leg Assessment: Performed By: Physician STONE III, Dashae Wilcher E., PA-C Debridement Type: Chemical/Enzymatic/Mechanical Agent Used: Santyl Pre-procedure Verification/Time Yes - 09:10 Out Taken: Start Time: 09:10 Pain Control: Lidocaine 4% Topical Solution Instrument: Other : tongue depressor Bleeding: None End Time: 09:11 Procedural Pain: 0 Post Procedural Pain: 0 Response to Treatment: Procedure was tolerated well Level of Consciousness: Awake and Alert Post Procedure Vitals: Temperature: 97.9 Pulse: 89 Respiratory Rate: 16 Blood Pressure: Systolic Blood Pressure: 903 Diastolic Blood Pressure: 91 Post Debridement Measurements of Total Wound Length: (cm) 3.3 Width:  (cm) 1.5 Depth: (cm) 0.3 Volume: (cm) 1.166 Character of Wound/Ulcer Post Debridement: Improved Post Procedure Diagnosis Same as Pre-procedure Electronic Signature(s) Signed: 10/03/2017 10:15:34 AM By: Montey Hora Signed: 10/04/2017 1:18:07 PM By: Worthy Keeler PA-C Entered By: Montey Hora on 10/03/2017 10:15:34 [name] ([id number]) -------------------------------------------------------------------------------- HPI Details Patient Name: [name] Date of Service: 10/03/2017 8:30 AM Medical Record Number: [id number] Patient Account Number: [id number] Date of Birth/Sex: [birth year] (36 y.o. M) Treating RN: Montey Hora Primary Care Provider: [name] Other Clinician: Referring Provider: [name] Treating Provider/Extender: Melburn Hake, Kyandre Okray Weeks in Treatment: 81 History of Present Illness HPI Description: 12/04/16; 37 year old man who comes into the clinic today for review of a wound on the posterior left calf. He tells me that is been there for about a year. He is not a diabetic he does smoke half a pack per day. He was seen in the ER on 11/20/16 felt to have cellulitis around the wound and was given clindamycin. An x-ray did not show osteomyelitis. The patient initially tells me that he has a milk allergy that sets off a pruritic itching rash on his lower legs which she scratches incessantly and he thinks that's what may have set up the wound. He has been using various topical antibiotics and ointments without any effect. He works in a [employer]ing [employer] and is on his feet all day. He does not have a prior history of wounds however he does have the rash on both lower legs the right arm and the ventral aspect of his left arm. These are excoriations and clearly have had scratching however there are of macular looking areas on both legs including a substantial larger area on the right leg. This does not have an underlying open area. There is no blistering. The  patient tells me that 2 years ago in [place] in response to the rash on his legs he saw a dermatologist who told him he had a condition which may be pyoderma gangrenosum although I may be putting words into his mouth.  He seemed to recognize this. 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 He is not in any treatment for this. He's had no recent travel 12/11/16; the patient arrives today with his wound and roughly the same condition we've been using silver alginate this is a deep punched out wound with some surrounding erythema but no tenderness. Biopsy I did did not show confirmed pyoderma gangrenosum suggested nonspecific inflammation and vasculitis but does not provide an actual description of what was seen by the pathologist. I'm really not able to understand this We have also received information from the patient's dermatologist in [place] notes from April 2016. This was a doctor Agarwal- antal. 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 He was prescribed topical steroid high potency under occlusion which helped but at this point the patient did not have a deep punched out wound. 12/18/16; the patient's wound is larger in terms of surface area however this surface looks better and there is less depth. The surrounding erythema also is better. The patient states that the wrap we put on came off 2 days ago when he has been using his compression stockings. He we are in the process of getting a dermatology consult. 12/26/16 on evaluation today patient's left lower extremity wound shows evidence of infection with surrounding erythema noted. He has been tolerating the dressing changes but states that he has noted more discomfort. There is a larger area of erythema surrounding the wound. No fevers, chills, nausea, or vomiting noted at this time. With that being said the wound still does have slough covering the surface. He is not allergic to any medication  that he is aware of at this point. In regard to his right lower extremity he had several regions that are erythematous and pruritic he wonders if there's anything we can do to help that. 01/02/17 I reviewed patient's wound culture which was obtained his visit last week. He was placed on doxycycline at that point. Unfortunately that does not appear to be an antibiotic that would likely help with the situation however the pseudomonas noted on culture is sensitive to Cipro. Also unfortunately patient's wound seems to have a large compared to last week's evaluation. Not severely so but there are definitely increased measurements in general. He is continuing to have discomfort as well he writes this to be a seven out of 10. In fact he would prefer me not to perform any debridement today due to the fact that he is having discomfort and considering he has an active infection on the little reluctant to do so anyway. No fevers, chills, nausea, or vomiting noted at this time. 01/08/17; patient seems dermatology on September 5. I suspect dermatology will want the slides from the biopsy I did sent to their pathologist. I'm not sure if there is a way we can expedite that. In any case the culture I did before I left on vacation 3 weeks ago showed Pseudomonas he was given 10 days of Cipro and per her description of her intake nurses is actually somewhat better this week although the wound is quite a bit bigger than I remember the last time I saw this. He still has 3 more days of Cipro LARICO, DIMOCK ([id number]) 01/21/17; dermatology appointment tomorrow. He has completed the ciprofloxacin for Pseudomonas. Surface of the wound looks better however he is had some deterioration in the lesions on his right leg. Meantime the left lateral leg wound we will continue with sample  01/29/17; patient had his dermatology appointment but I can't yet see that note. He is completed his antibiotics. The wound is more superficial but  considerably larger in circumferential area than when he came in. This is in his left lateral calf. He also has swollen erythematous areas with superficial wounds on the right leg and small papular areas on both arms. There apparently areas in her his upper thighs and buttocks I did not look at those. Dermatology biopsied the right leg. Hopefully will have their input next week. 02/05/17; patient went back to see his dermatologist who told him that he had a "scratching problem" as well as staph. He is now on a 30 day course of doxycycline and I believe she gave him triamcinolone cream to the right leg areas to help with the itching [not exactly sure but probably triamcinolone]. She apparently looked at the left lateral leg wound although this was not rebiopsied and I think felt to be ultimately part of the same pathogenesis. He is using sample border foam and changing nevus himself. He now has a new open area on the right posterior leg which was his biopsy site I don't have any of the dermatology notes 02/12/17; we put the patient in compression last week with SANTYL to the wound on the left leg and the biopsy. Edema is much better and the depth of the wound is now at level of skin. Area is still the same oBiopsy site on the right lateral leg we've also been using santyl with a border foam dressing and he is changing this himself. 02/19/17; Using silver alginate started last week to both the substantial left leg wound and the biopsy site on the right wound. He is tolerating compression well. Has a an appointment with his primary M.D. tomorrow wondering about diuretics although I'm wondering if the edema problem is actually lymphedema 02/26/17; the patient has been to see his primary doctor Dr. Jerrel Ivory at [facility] our primary care. She started him on Lasix 20 mg and this seems to have helped with the edema. However we are not making substantial change with the left lateral calf wound and  inflammation. The biopsy site on the right leg also looks stable but not really all that different. 03/12/17; the patient has been to see vein and vascular Dr. Lucky Cowboy. He has had venous reflux studies I have not reviewed these. I did get a call from his dermatology office. 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 They therefore recommended clobetasol and tetracycline i.e. 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 03/26/17; apparently the patient just had reflux studies not an appointment with Dr. dew. She arrives in clinic today having applied clobetasol for 2-3 weeks. He notes over the last 2-3 days excessive drainage having to change the dressing 3-4 times a day and also expanding erythema. 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 Lillia Carmel at [facility] dermatology locally who kindly saw him at my request. 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 My concern was the degree of erythema medially extending from his left leg wound which was either inflammation from pyoderma or cellulitis. I put him on Augmentin however culture of the wound showed Pseudomonas which is quinolone sensitive. I really don't believe he has cellulitis however in view of everything I will continue and give him a course of Cipro. He is also on doxycycline as an immune modulator for the pyoderma. In addition to his original wound on the left  lateral leg with surrounding erythema he has a wound on the right posterior calf which was an original biopsy site done by dermatology. 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 Saw Dr. Lillia Carmel yesterday. He has been using topical antibiotics to both wound areas his original wound on the left and the biopsies/pathergy area on the right. There is definitely some improvement in the inflammation around the wound on the right although the patient states he has increasing sensitivity of the wounds. He is on prednisone 60 and doxycycline 1 as prescribed by Dr. Lillia Carmel. He is covering the topical antibiotic with gauze and putting this in his own compression stocks and changing this daily. He states that Dr. Lottie Rater did a culture of the left leg wound yesterday 05/07/17; pyoderma gangrenosum. The patient saw Dr. Lillia Carmel yesterday and has a follow-up with her in one month. He is still using topical antibiotics to both wounds although he can't recall exactly what type. He is still on prednisone 60 mg. Dr. Lillia Carmel stated that the doxycycline could stop if we were in agreement. 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 The right medial leg was induced by biopsy/pathergy. The area on the right is essentially healed. 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 The right medial leg has closed and remains closed. He is still on prednisone 60. oHe tells me he missed his last dermatology appointment with Dr. Lillia Carmel but will make another appointment. He reports that her blood sugar at a recent screen in [place] was high 200's. He was 180 today. He is more cushingoid blood pressure is Borquez, Braydin ([id number]) up a bit. I think he is going to require  still much longer prednisone perhaps another 3 months before attempting to taper. In the meantime his wound is a lot better. Smaller. He is cleaning this off daily and applying topical antibiotics. When he was last in the clinic I thought about changing to [facility] and actually put in a couple of calls to dermatology although probably not during their business hours. 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 He continues on oral prednisone. He has been using triple antibiotic ointment. There is surface debris and we will transition to [facility] and have him return in 2 weeks. He has lost 30 pounds since his last appointment with lifestyle modification. He may benefit from topical steroid cream for treatment this can be considered at a later date. 08/22/17 on evaluation today patient appears to actually be doing rather well in regard to his left lateral lower extremity ulcer. He has actually been managed by Dr. Dellia Nims most recently. Patient  is currently on oral steroids at this time. This seems to have been of benefit for him. Nonetheless his last visit was actually with Leah on 08/06/17. Currently he is not utilizing any topical steroid creams although this could be of benefit as well. No fevers, chills, nausea, or vomiting noted at this time. 09/05/17 on evaluation today patient appears to be doing better in regard to his left lateral lower extremity ulcer. He has been tolerating the dressing changes without complication. He is using Santyl with good effect. Overall I'm very pleased with how things are standing at this point. Patient likewise is happy that this is doing better. 09/19/17 on evaluation today patient actually appears to be doing rather well in regard to his left lateral lower extremity ulcer. Again this is secondary to Pyoderma gangrenosum and he seems to be  progressing well with the Santyl which is good news. He's not having any significant pain. 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 He has been tolerating the Santyl without complication and in general I feel like he's making good progress. Electronic Signature(s) Signed: 10/04/2017 1:18:07 PM By: Worthy Keeler PA-C Entered By: Worthy Keeler on 10/04/2017 12:38:55 KAYVON, MO ([id number]) -------------------------------------------------------------------------------- Physical Exam Details Patient Name: [name] Date of Service: 10/03/2017 8:30 AM Medical Record Number: [id number] Patient Account Number: [id number] Date of Birth/Sex: 06/09/1980 (36 y.o. M) Treating RN: Montey Hora Primary Care Provider: [name] Other Clinician: Referring Provider: [name] Treating Provider/Extender: Melburn Hake, Ziana Heyliger Weeks in Treatment: 74 Constitutional Well-nourished and well-hydrated in no acute distress. Respiratory normal breathing without difficulty. clear to auscultation bilaterally. Cardiovascular regular rate and rhythm with normal S1, S2. Psychiatric this patient is able to make decisions and demonstrates good insight into disease process. Alert and Oriented x 3. pleasant and cooperative. Notes Patient's wound shows no evidence of infection which is good news she seems to be keeping it very clean and the Santyl seems to be doing well to help take care of the slough on the surface of the wound which is slowly but surely resolving. Overall I'm pleased with his progress. Electronic Signature(s) Signed: 10/04/2017 1:18:07 PM By: Worthy Keeler PA-C Entered By: Worthy Keeler on 10/04/2017 12:39:31 LAVEL, RIEMAN ([id number]) -------------------------------------------------------------------------------- Physician Orders Details Patient Name: [name] Date of Service: 10/03/2017 8:30 AM Medical Record Number: [id number] Patient Account Number: [id number] Date of Birth/Sex: [birth year] (36 y.o. M) Treating RN: Montey Hora Primary Care Provider: [name] Other Clinician: Referring Provider: [name] Treating Provider/Extender: Melburn Hake, Tylin Force Weeks in Treatment: 60 Verbal / Phone Orders: No Diagnosis Coding ICD-10 Coding Code Description [phone] 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sis of muscle L88 Pyoderma gangrenosum I87.2 Venous insufficiency (chronic) (peripheral) Wound Cleansing Wound #1 Left,Lateral Lower Leg o Clean wound with Normal Saline. Anesthetic (add to Medication List) Wound #1 Left,Lateral Lower Leg o Topical Lidocaine 4% cream applied to wound bed prior to debridement (In Clinic Only). Primary Wound Dressing Wound #1 Left,Lateral Lower Leg o Santyl Ointment Secondary Dressing Wound #1 Left,Lateral Lower Leg o XtraSorb - secure with tape Dressing Change Frequency Wound #1 Left,Lateral Lower Leg o Change dressing every day. Follow-up Appointments Wound #1 Left,Lateral Lower Leg o Return Appointment in 2 weeks. Edema Control Wound #1 Left,Lateral Lower Leg o Patient to wear own compression stockings Electronic Signature(s) Signed: 10/03/2017 4:56:04 PM By: Montey Hora Signed: 10/04/2017 1:18:07 PM By:  Melburn Hake, Sumiye Hirth PA-C Entered By: Montey Hora on 10/03/2017 Badger, Lejon ([id number]) EDKER, PUNT ([id number]) -------------------------------------------------------------------------------- Problem List Details Patient Name: KASTIEL, SIMONIAN Date of Service: 10/03/2017 8:30 AM Medical Record Number: [id number] Patient Account Number: [id number] Date of Birth/Sex: Oct 08, 1980 (36 y.o. M) Treating RN: Montey Hora Primary Care Provider: [name] Other Clinician: Referring Provider: [name] Treating Provider/Extender: Melburn Hake,  Christella App Weeks in Treatment: 70 Active Problems ICD-10 Impacting Encounter Code Description Active Date Wound Healing Diagnosis L97.223 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sis of muscle 12/04/2016 Yes L88 Pyoderma gangrenosum 03/26/2017 Yes I87.2 Venous insufficiency (chronic) (peripheral) 12/04/2016 Yes Inactive Problems ICD-10 Code Description Active Date Inactive Date L97.213 Non-pressure chronic ulcer of right calf with necrosis of muscle 04/02/2017 04/02/2017 Resolved Problems Electronic Signature(s) Signed: 10/04/2017 1:18:07 PM By: Worthy Keeler PA-C Entered By: Worthy Keeler on 10/03/2017 08:36:54 Kasper, [name] ([id number]) -------------------------------------------------------------------------------- Progress Note Details Patient Name: [name] Date of Service: 10/03/2017 8:30 AM Medical Record Number: [id number] Patient Account Number: [id number] Date of Birth/Sex: 05/06/1981 (36 y.o. M) Treating RN: Montey Hora Primary Care Provider: [name] Other Clinician: Referring Provider: [name] Treating Provider/Extender: Melburn Hake, Vilma Will Weeks in Treatment: 37 Subjective Chief Complaint Information obtained from Patient He is here in follow up evaluation for LLE pyoderma ulcer History of Present Illness (HPI) 12/04/16; 37 year old man who comes into the clinic today for review of a wound on the posterior left calf. He tells me that is been there for about a year. He is not a diabetic he does smoke half a pack per day. He was seen in the ER on 11/20/16 felt to have cellulitis around the wound and was given clindamycin. An x-ray did not show osteomyelitis. The patient initially tells me that he has a milk allergy that sets off a pruritic itching rash on his lower legs which she scratches incessantly and he thinks that's what may have set up the wound. He has been using various topical antibiotics and ointments without any effect. He works in a  [employer]ing [employer] and is on his feet all day. He does not have a prior history of wounds however he does have the rash on both lower legs the right arm and the ventral aspect of his left arm. These are excoriations and clearly have had scratching however there are of macular looking areas on both legs including a substantial larger area on the right leg. This does not have an underlying open area. There is no blistering. The patient tells me that 2 years ago in [place] in response to the rash on his legs he saw a dermatologist who told him he had a condition which may be pyoderma gangrenosum although I may be putting words into his mouth. He seemed to recognize this. 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 He is not in any treatment for this. He's had no recent travel 12/11/16; the patient arrives today with his wound and roughly the same condition we've been using silver alginate this is a deep punched out wound with some surrounding erythema but no tenderness. Biopsy I did did not show confirmed pyoderma gangrenosum suggested nonspecific inflammation and vasculitis but does not provide an actual description of what was seen by the pathologist. I'm really not able to understand this We have also received information from the patient's dermatologist in [place] notes from April 2016. This was a doctor Agarwal- antal. The diagnosis seems to have been lichen  simplex chronicus. He was prescribed topical steroid high potency under occlusion which helped but at this point the patient did not have a deep punched out wound. 12/18/16; the patient's wound is larger in terms of surface area however this surface looks better and there is less depth. The surrounding erythema also is better. The patient states that the wrap we put on came off 2 days ago when he has been using his compression stockings. He we are in the process of getting a dermatology consult. 12/26/16 on  evaluation today patient's left lower extremity wound shows evidence of infection with surrounding erythema noted. He has been tolerating the dressing changes but states that he has noted more discomfort. There is a larger area of erythema surrounding the wound. No fevers, chills, nausea, or vomiting noted at this time. With that being said the wound still does have slough covering the surface. He is not allergic to any medication that he is aware of at this point. In regard to his right lower extremity he had several regions that are erythematous and pruritic he wonders if there's anything we can do to help that. 01/02/17 I reviewed patient's wound culture which was obtained his visit last week. He was placed on doxycycline at that point. Unfortunately that does not appear to be an antibiotic that would likely help with the situation however the pseudomonas noted on culture is sensitive to Cipro. Also unfortunately patient's wound seems to have a large compared to last week's evaluation. Not severely so but there are definitely increased measurements in general. He is continuing to have discomfort as well he writes this to be a seven out of 10. In fact he would prefer me not to perform any debridement today due to the fact that he is having discomfort and considering he has an active infection on the little reluctant to do so anyway. No fevers, Givler, Mavrik ([id number]) chills, nausea, or vomiting noted at this time. 01/08/17; patient seems dermatology on September 5. I suspect dermatology will want the slides from the biopsy I did sent to their pathologist. I'm not sure if there is a way we can expedite that. In any case the culture I did before I left on vacation 3 weeks ago showed Pseudomonas he was given 10 days of Cipro and per her description of her intake nurses is actually somewhat better this week although the wound is quite a bit bigger than I remember the last time I saw this. He still has  3 more days of Cipro 01/21/17; dermatology appointment tomorrow. He has completed the ciprofloxacin for Pseudomonas. Surface of the wound looks better however he is had some deterioration in the lesions on his right leg. Meantime the left lateral leg wound we will continue with sample 01/29/17; patient had his dermatology appointment but I can't yet see that note. He is completed his antibiotics. The wound is more superficial but considerably larger in circumferential area than when he came in. This is in his left lateral calf. He also has swollen erythematous areas with superficial wounds on the right leg and small papular areas on both arms. There apparently areas in her his upper thighs and buttocks I did not look at those. Dermatology biopsied the right leg. Hopefully will have their input next week. 02/05/17; patient went back to see his dermatologist who told him that he had a "scratching problem" as well as staph. He is now on a 30 day course of doxycycline and I believe she gave him  triamcinolone cream to the right leg areas to help with the itching [not exactly sure but probably triamcinolone]. She apparently looked at the left lateral leg wound although this was not rebiopsied and I think felt to be ultimately part of the same pathogenesis. He is using sample border foam and changing nevus himself. He now has a new open area on the right posterior leg which was his biopsy site I don't have any of the dermatology notes 02/12/17; we put the patient in compression last week with SANTYL to the wound on the left leg and the biopsy. Edema is much better and the depth of the wound is now at level of skin. Area is still the same Biopsy site on the right lateral leg we've also been using santyl with a border foam dressing and he is changing this himself. 02/19/17; Using silver alginate started last week to both the substantial left leg wound and the biopsy site on the right wound. He is tolerating  compression well. Has a an appointment with his primary M.D. tomorrow wondering about diuretics although I'm wondering if the edema problem is actually lymphedema 02/26/17; the patient has been to see his primary doctor Dr. Jerrel Ivory at [facility] our primary care. She started him on Lasix 20 mg and this seems to have helped with the edema. However we are not making substantial change with the left lateral calf wound and inflammation. The biopsy site on the right leg also looks stable but not really all that different. 03/12/17; the patient has been to see vein and vascular Dr. Lucky Cowboy. He has had venous reflux studies I have not reviewed these. I did get a call from his dermatology office. 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 They therefore recommended clobetasol and tetracycline i.e. 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 03/26/17; apparently the patient just had reflux studies not an appointment with Dr. dew. She arrives in clinic today having applied clobetasol for 2-3 weeks. He notes over the last 2-3 days excessive drainage having to change the dressing 3-4 times a day and also expanding erythema. 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 Lillia Carmel at [facility] dermatology locally who kindly saw him at my request. 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 My concern was the degree of erythema medially  extending from his left leg wound which was either inflammation from pyoderma or cellulitis. I put him on Augmentin however culture of the wound showed Pseudomonas which is quinolone sensitive. I really don't believe he has cellulitis however in view of everything I will continue and give him a course of Cipro. He is also on doxycycline as an immune modulator for the pyoderma. In addition to his original wound on the left lateral leg with surrounding erythema he has a wound on the right posterior calf which was an original biopsy site done by dermatology. This was felt to represent pathergy from pyoderma gangrenosum  04/16/17; pyoderma gangrenosum. Saw Dr. Lillia Carmel yesterday. He has been using topical antibiotics to both wound areas his original wound on the left and the biopsies/pathergy area on the right. There is definitely some improvement in the inflammation around the wound on the right although the patient states he has increasing sensitivity of the wounds. He is on prednisone 60 and doxycycline 1 as prescribed by Dr. Lillia Carmel. He is covering the topical antibiotic with gauze and putting this in his own compression stocks and changing this daily. He states that Dr. Lottie Rater did a culture of the left leg wound yesterday 05/07/17; pyoderma gangrenosum. The patient saw Dr. Lillia Carmel yesterday and has a follow-up with her in one month. He is still using topical antibiotics to both wounds although he can't recall exactly what type. He is still on prednisone 60 mg. Dr. Lillia Carmel stated that the doxycycline could stop if we were in agreement. 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 The right medial leg was induced by [name] ([id number]) biopsy/pathergy. The area on the right is essentially healed. Still on high-dose prednisone using topical antibiotics to the wound 07/09/17;  pyoderma gangrenosum with wounds on the left lateral leg. The right medial leg has closed and remains closed. He is still on prednisone 60. He tells me he missed his last dermatology appointment with Dr. Lillia Carmel but will make another appointment. He reports that her blood sugar at a recent screen in [place] was high 200's. He was 180 today. 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 I think he is going to require still much longer prednisone perhaps another 3 months before attempting to taper. In the meantime his wound is a lot better. Smaller. He is cleaning this off daily and applying topical antibiotics. When he was last in the clinic I thought about changing to [facility] and actually put in a couple of calls to dermatology although probably not during their business hours. 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 He continues on oral prednisone. He has been using triple antibiotic ointment. There is surface debris and we will transition to [facility] and have him return in 2 weeks. He has lost 30 pounds since his last appointment with lifestyle modification. He may benefit from topical steroid cream for treatment this can be considered at a later date. 08/22/17 on evaluation today patient appears to actually be doing rather well in regard to his left lateral lower extremity ulcer. He has actually been managed by Dr. Dellia Nims most recently. Patient is currently on oral steroids at this time. This seems to have been of benefit for him. Nonetheless his last visit was actually with Leah on 08/06/17. Currently he is not utilizing any topical steroid creams although this could be of benefit as well. No fevers, chills, nausea, or vomiting noted at this time. 09/05/17 on evaluation today patient appears to be doing better in regard to his left lateral lower extremity ulcer. He  has been tolerating the dressing changes without complication. He is using Santyl with good effect. Overall I'm very pleased with how things are standing at this point. Patient likewise is happy that this is doing better. 09/19/17 on evaluation today patient actually appears to be doing rather well in regard to his  left lateral lower extremity ulcer. 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 He's not having any significant pain. 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 He has been tolerating the Santyl without complication and in general I feel like he's making good progress. Patient History Information obtained from Patient. Family History Diabetes - Mother,Maternal Grandparents, Heart Disease - Mother,Maternal Grandparents, Hereditary Spherocytosis - Siblings, Hypertension - Maternal Grandparents, Stroke - Siblings, No family history of Cancer, Kidney Disease, Lung Disease, Seizures, Thyroid Problems, Tuberculosis. Social History Current some day smoker, Marital Status - Married, Alcohol Use - Rarely, Drug Use - No History, Caffeine Use - Moderate. Review of Systems (ROS) Constitutional Symptoms (General Health) Denies complaints or symptoms of Fever, Chills. Respiratory The patient has no complaints or symptoms. Cardiovascular The patient has no complaints or symptoms. Psychiatric The patient has no complaints or symptoms. PIERRE, DELLAROCCO ([id number]) Objective Constitutional Well-nourished and well-hydrated in no acute distress. Vitals Time Taken: 8:40 AM, Height: 71 in, Weight: 338 lbs, BMI: 47.1, Temperature: 97.9 F, Pulse: 89 bpm, Respiratory Rate: 16 breaths/min, Blood Pressure: 139/91 mmHg. Respiratory normal breathing without difficulty. clear to auscultation  bilaterally. Cardiovascular regular rate and rhythm with normal S1, S2. Psychiatric this patient is able to make decisions and demonstrates good insight into disease process. Alert and Oriented x 3. pleasant and cooperative. General Notes: Patient's wound shows no evidence of infection which is good news she seems to be keeping it very clean and the Santyl seems to be doing well to help take care of the slough on the surface of the wound which is slowly but surely resolving. Overall I'm pleased with his progress. Integumentary (Hair, Skin) Wound #1 status is Open. Original cause of wound was Gradually Appeared. The wound is located on the Left,Lateral Lower Leg. The wound measures 3.3cm length x 1.5cm width x 0.3cm depth; 3.888cm^2 area and 1.166cm^3 volume. There is Fat Layer (Subcutaneous Tissue) Exposed exposed. There is no tunneling or undermining noted. There is a medium amount of serous drainage noted. The wound margin is distinct with the outline attached to the wound base. There is small (1-33%) red granulation within the wound bed. There is a large (67-100%) amount of necrotic tissue within the wound bed including Adherent Slough. The periwound skin appearance exhibited: Induration, Hemosiderin Staining, Erythema. The periwound skin appearance did not exhibit: Callus, Crepitus, Excoriation, Rash, Scarring, Dry/Scaly, Maceration, Atrophie Blanche, Cyanosis, Ecchymosis, Mottled, Pallor, Rubor. The surrounding wound skin color is noted with erythema which is circumferential. Periwound temperature was noted as No Abnormality. The periwound has tenderness on palpation. 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 There was a Chemical/Enzymatic/Mechanical debridement performed by STONE III, Reizel Calzada E., PA-C. With the following instrument(s): tongue depressor after achieving pain control using Lidocaine 4% Topical Solution. Agent used was [employer]. A time out was conducted at 09:10, prior to the start of the procedure. There was no bleeding. The procedure was tolerated well with a pain level of 0 throughout and a pain level of 0 following the procedure. Patient s Level of Consciousness post procedure was recorded as Awake and  Alert and post-procedure vitals were taken including Temperature: 97.9 F, Pulse: 89 bpm, Respiratory Rate: 16 breaths/min, Blood Pressure: (139)/(91) mmHg. Post Debridement Measurements: 3.3cm length x 1.5cm width x 0.3cm depth; 1.166cm^3 volume. Character of Wound/Ulcer Post Debridement is improved. Post procedure Diagnosis Wound #1: Same as Pre-Procedure Plan Wound Cleansing: Wound #1 Left,Lateral Lower Leg: Clean wound with Normal Saline. Anesthetic (add to Medication List): Wound #1 Left,Lateral Lower Leg: Topical Lidocaine 4% cream applied to wound bed prior to debridement (In Clinic Only). Primary Wound Dressing: Wound #1 Left,Lateral Lower Leg: Santyl Ointment Secondary Dressing: Wound #1 Left,Lateral Lower Leg: XtraSorb - secure with tape Dressing Change Frequency: Wound #1 Left,Lateral Lower Leg: Change dressing every day. Follow-up Appointments: Wound #1 Left,Lateral Lower Leg: Return Appointment in 2 weeks. Edema Control: Wound #1 Left,Lateral Lower Leg: Patient to wear own compression stockings I am going to suggest currently that we continue with the Current wound care measures since the patient seems to be making good progress. He's in agreement the plan. We will subsequently see were things stand at follow-up in two weeks time. Please see above for specific wound care orders. We will see patient for re-evaluation in 2 week(s) here in the clinic. If anything  worsens or changes patient will contact our office for additional recommendations. Electronic Signature(s) Signed: 10/04/2017 1:18:07 PM By: Worthy Keeler PA-C Entered By: Worthy Keeler on 10/04/2017 12:40:09 SHAMARCUS, HOHEISEL ([id number]) -------------------------------------------------------------------------------- ROS/PFSH Details Patient Name: [name] Date of Service: 10/03/2017 8:30 AM Medical Record Number: [id number] Patient Account Number: [id number] Date of Birth/Sex: November 24, 1980 (36 y.o. M) Treating RN: Montey Hora Primary Care Provider: [name] Other Clinician: Referring Provider: [name] Treating Provider/Extender: Melburn Hake, Kanae Ignatowski Weeks in Treatment: 42 Information Obtained From Patient Wound History Do you currently have one or more open woundso Yes How many open wounds do you currently haveo 1 Approximately how long have you had your woundso 1 year How have you been treating your wound(s) until nowo neosporin Has your wound(s) ever healed and then re-openedo No Have you had any lab work done in the past montho No Have you tested positive for an antibiotic resistant organism (MRSA, VRE)o No Have you tested positive for osteomyelitis (bone infection)o No Have you had any tests for circulation on your legso No Have you had other problems associated with your woundso Infection, Swelling Constitutional Symptoms (General Health) Complaints and Symptoms: Negative for: Fever; Chills Eyes Medical History: Negative for: Cataracts; Glaucoma; Optic Neuritis Ear/Nose/Mouth/Throat Medical History: Negative for: Chronic sinus problems/congestion; Middle ear problems Hematologic/Lymphatic Medical History: Negative for: Anemia; Hemophilia; Human Immunodeficiency Virus; Lymphedema; Sickle Cell Disease Respiratory Complaints and Symptoms: No Complaints or Symptoms Medical History: Positive for: Sleep Apnea - cpap Negative for: Aspiration; Asthma; Chronic  Obstructive Pulmonary Disease (COPD); Pneumothorax Cardiovascular Complaints and Symptoms: No Complaints or Symptoms Medical History: Positive for: Hypertension Selke, Amarie ([id number]) Negative for: Angina; Arrhythmia; Congestive Heart Failure; Coronary Artery Disease; Deep Vein Thrombosis; Hypotension; Myocardial Infarction; Peripheral Arterial Disease; Peripheral Venous Disease; Phlebitis; Vasculitis Gastrointestinal Medical History: Positive for: Colitis Endocrine Medical History: Negative for: Type I Diabetes; Type II Diabetes Genitourinary Medical History: Negative for: End Stage Renal Disease Immunological Medical History: Negative for: Lupus Erythematosus; Raynaudos; Scleroderma Integumentary (Skin) Medical History: Negative for: History of Burn; History of pressure wounds Musculoskeletal Medical History: Negative for: Gout; Rheumatoid Arthritis; Osteoarthritis; Osteomyelitis Neurologic Medical History: Negative for: Dementia; Neuropathy; Quadriplegia; Paraplegia; Seizure Disorder Oncologic Medical History: Negative for: Received Chemotherapy; Received Radiation Psychiatric Complaints and Symptoms: No  Complaints or Symptoms Medical History: Negative for: Anorexia/bulimia; Confinement Anxiety Immunizations Pneumococcal Vaccine: Received Pneumococcal Vaccination: No Implantable Devices Family and Social History TREVER, STREATER ([id number]) Cancer: No; Diabetes: Yes - Mother,Maternal Grandparents; Heart Disease: Yes - Mother,Maternal Grandparents; Hereditary Spherocytosis: Yes - Siblings; Hypertension: Yes - Maternal Grandparents; Kidney Disease: No; Lung Disease: No; Seizures: No; Stroke: Yes - Siblings; Thyroid Problems: No; Tuberculosis: No; Current some day smoker; Marital Status - Married; Alcohol Use: Rarely; Drug Use: No History; Caffeine Use: Moderate; Financial Concerns: No; Food, Clothing or Shelter Needs: No; Support System Lacking: No; Transportation  Concerns: No; Advanced Directives: No; Patient does not want information on Advanced Directives; Living Will: No Physician Affirmation I have reviewed and agree with the above information. Electronic Signature(s) Signed: 10/04/2017 1:18:07 PM By: Worthy Keeler PA-C Signed: 10/06/2017 4:46:49 PM By: Montey Hora Entered By: Worthy Keeler on 10/04/2017 12:39:14 SHERLEY, LESER ([id number]) -------------------------------------------------------------------------------- SuperBill Details Patient Name: [name] Date of Service: 10/03/2017 Medical Record Number: [id number] Patient Account Number: [id number] Date of Birth/Sex: 11-07-1980 (37 y.o. M) Treating RN: Montey Hora Primary Care Provider: [name] Other Clinician: Referring Provider: [name] Treating Provider/Extender: Melburn Hake, Lundy Cozart Weeks in Treatment: 54 Diagnosis Coding ICD-10 Codes Code Description [id number] 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sis of muscle L88 Pyoderma gangrenosum I87.2 Venous insufficiency (chronic) (peripheral) Facility Procedures CPT4 Code: [id number] Description: 70350 - DEBRIDE W/O ANES NON SELECT Modifier: Quantity: 1 Physician Procedures CPT4 Code: [id number] Description: 99371 - WC PHYS LEVEL 3 - EST PT ICD-10 Diagnosis Description L97.223 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sis of m L88 Pyoderma gangrenosum I87.2 Venous insufficiency (chronic) (peripheral) Modifier: uscle Quantity: 1 Electronic Signature(s) Signed: 10/04/2017 1:18:07 PM By: Worthy Keeler PA-C Entered By: Worthy Keeler on 10/04/2017 12:40:25

## 2017-10-08 NOTE — Progress Notes (Signed)
NIKE, SOUTHWELL ([id number]) Visit Report for 10/03/2017 Arrival Information Details Patient Name: [name] Date of Service: 10/03/2017 8:30 AM Medical Record Number: [id number] Patient Account Number: [id number] Date of Birth/Sex: Jun 18, 1980 (37 y.o. M) Treating RN: Secundino Ginger Primary Care Octavio Matheney: [name] Other Clinician: Referring Salinda Snedeker: [name] Treating Yoniel Arkwright/Extender: Melburn Hake, [name]: 40 Visit Information History Since Last Visit Added or deleted any medications: No Patient Arrived: Ambulatory Any new allergies or adverse reactions: No Arrival Time: 08:38 Had a fall or experienced change in No Accompanied By: self activities of daily living that may affect Transfer Assistance: None risk of falls: Patient Identification Verified: Yes Signs or symptoms of abuse/neglect since last visito No Secondary Verification Process Completed: Yes Hospitalized since last visit: No Patient Requires Transmission-Based No Implantable device outside of the clinic excluding No Precautions: cellular tissue based products placed in the center Patient Has Alerts: No since last visit: Has Dressing in Place as Prescribed: Yes Pain Present Now: No Electronic Signature(s) Signed: 10/07/2017 3:46:25 PM By: Secundino Ginger Entered By: Secundino Ginger on 10/03/2017 08:40:21 [name] ([id number]) -------------------------------------------------------------------------------- Encounter Discharge Information Details Patient Name: [name] Date of Service: 10/03/2017 8:30 AM Medical Record Number: [id number] Patient Account Number: [id number] Date of Birth/Sex: 04-30-1981 (36 y.o. M) Treating RN: Montey Hora Primary Care Mekaela Azizi: [name] Other Clinician: Referring Sherian Valenza: [name] Treating Darrell Leonhardt/Extender: Melburn Hake, [name]: 67 Encounter Discharge Information Items Discharge Condition: Stable Ambulatory Status:  Ambulatory Discharge Destination: Home Transportation: Private Auto Accompanied By: self Schedule Follow-up Appointment: Yes Clinical Summary of Care: Electronic Signature(s) Signed: 10/03/2017 10:12:16 AM By: Montey Hora Entered By: Montey Hora on 10/03/2017 10:12:15 [name] ([id number]) -------------------------------------------------------------------------------- Lower Extremity Assessment Details Patient Name: [name] Date of Service: 10/03/2017 8:30 AM Medical Record Number: [id number] Patient Account Number: [id number] Date of Birth/Sex: 09-06-1980 (36 y.o. M) Treating RN: Secundino Ginger Primary Care Kayen Grabel: [name] Other Clinician: Referring Briar Witherspoon: [name] Treating Brylei Pedley/Extender: Melburn Hake, [name]: 71 Vascular Assessment Pulses: Dorsalis Pedis Palpable: [Left:Yes] Posterior Tibial Extremity colors, hair growth, and conditions: Extremity Color: [Left:Normal] Hair Growth on Extremity: [Left:Yes] Temperature of Extremity: [Left:Warm] Capillary Refill: [Left:< 3 seconds] Toe Nail Assessment Left: Right: Thick: Yes Discolored: No Deformed: No Improper Length and Hygiene: No Electronic Signature(s) Signed: 10/07/2017 3:46:25 PM By: Secundino Ginger Entered By: Secundino Ginger on 10/03/2017 08:54:24 [name] ([id number]) -------------------------------------------------------------------------------- Multi Wound Chart Details Patient Name: [name] Date of Service: 10/03/2017 8:30 AM Medical Record Number: [id number] Patient Account Number: [id number] Date of Birth/Sex: 1980/10/31 (36 y.o. M) Treating RN: Montey Hora Primary Care Daaron Dimarco: [name] Other Clinician: Referring Malarie Tappen: [name] Treating Nichalas Coin/Extender: Melburn Hake, [name]: 11 Vital Signs Height(in): 71 Pulse(bpm): 61 Weight(lbs): 338 Blood Pressure(mmHg): 139/91 Body Mass Index(BMI): 47 Temperature(F):  97.9 Respiratory Rate 16 (breaths/min): Photos: [N/A:N/A] Wound Location: Left Lower Leg - Lateral N/A N/A Wounding Event: Gradually Appeared N/A N/A Primary Etiology: Pyoderma N/A N/A Comorbid History: Sleep Apnea, Hypertension, N/A N/A Colitis Date Acquired: 11/18/2015 N/A N/A Weeks of Treatment: 43 N/A N/A Wound Status: Open N/A N/A Measurements L x W x D 3.3x1.5x0.3 N/A N/A (cm) Area (cm) : 3.888 N/A N/A Volume (cm) : 1.166 N/A N/A % Reduction in Area: 20.80% N/A N/A % Reduction in Volume: 70.30% N/A N/A Classification: Full Thickness Without N/A N/A Exposed Support Structures Exudate Amount: Medium N/A N/A Exudate Type: Serous N/A N/A Exudate Color: amber N/A N/A Wound Margin: Distinct, outline attached N/A N/A  Granulation Amount: Small (1-33%) N/A N/A Granulation Quality: Red N/A N/A Necrotic Amount: Large (67-100%) N/A N/A Exposed Structures: Fat Layer (Subcutaneous N/A N/A Tissue) Exposed: Yes Fascia: No Tendon: No Muscle: No [name], [name] ([id number]) Joint: No Bone: No Epithelialization: Medium (34-66%) N/A N/A Periwound Skin Texture: Induration: Yes N/A N/A Excoriation: No Callus: No Crepitus: No Rash: No Scarring: No Periwound Skin Moisture: Maceration: No N/A N/A Dry/Scaly: No Periwound Skin Color: Erythema: Yes N/A N/A Hemosiderin Staining: Yes Atrophie Blanche: No Cyanosis: No Ecchymosis: No Mottled: No Pallor: No Rubor: No Erythema Location: Circumferential N/A N/A Erythema Change: Increased N/A N/A Temperature: No Abnormality N/A N/A Tenderness on Palpation: Yes N/A N/A Wound Preparation: Ulcer Cleansing: Wound N/A N/A Cleanser Topical Anesthetic Applied: Other: Lidocaine 4% Treatment Notes Electronic Signature(s) Signed: 10/03/2017 4:56:04 PM By: Montey Hora Entered By: Montey Hora on 10/03/2017 09:08:07 [name]  ([id number]) -------------------------------------------------------------------------------- Multi-Disciplinary Care Plan Details Patient Name: [name] Date of Service: 10/03/2017 8:30 AM Medical Record Number: [id number] Patient Account Number: [id number] Date of Birth/Sex: 05-12-1981 (36 y.o. M) Treating RN: Montey Hora Primary Care Noami Bove: [name] Other Clinician: Referring Trestin Vences: [name] Treating Srah Ake/Extender: Melburn Hake, [name]: 69 Active Inactive ` Orientation to the Wound Care Program Nursing Diagnoses: Knowledge deficit related to the wound healing center program Goals: Patient/caregiver will verbalize understanding of the [place] [facility] Program Date Initiated: 12/11/2016 Target Resolution Date: 03/13/2017 Goal Status: Active Interventions: Provide education on orientation to the wound center Notes: ` Venous Leg Ulcer Nursing Diagnoses: Knowledge deficit related to disease process and management Potential for venous Insuffiency (use before diagnosis confirmed) Goals: Non-invasive venous studies are completed as ordered Date Initiated: 12/11/2016 Target Resolution Date: 03/13/2017 Goal Status: Active Patient will maintain optimal edema control Date Initiated: 12/11/2016 Target Resolution Date: 03/13/2017 Goal Status: Active Patient/caregiver will verbalize understanding of disease process and disease management Date Initiated: 12/11/2016 Target Resolution Date: 03/13/2017 Goal Status: Active Verify adequate tissue perfusion prior to therapeutic compression application Date Initiated: 12/11/2016 Target Resolution Date: 03/13/2017 Goal Status: Active Interventions: Assess peripheral edema status every visit. Compression as ordered Provide education on venous insufficiency Treatment Activities: [name], [name] ([id number]) Therapeutic compression applied : 12/11/2016 Notes: ` Wound/Skin Impairment Nursing  Diagnoses: Impaired tissue integrity Knowledge deficit related to ulceration/compromised skin integrity Goals: Patient/caregiver will verbalize understanding of skin care regimen Date Initiated: 12/11/2016 Target Resolution Date: 03/13/2017 Goal Status: Active Ulcer/skin breakdown will have a volume reduction of 30% by week 4 Date Initiated: 12/11/2016 Target Resolution Date: 03/13/2017 Goal Status: Active Ulcer/skin breakdown will have a volume reduction of 50% by week 8 Date Initiated: 12/11/2016 Target Resolution Date: 03/13/2017 Goal Status: Active Ulcer/skin breakdown will have a volume reduction of 80% by week 12 Date Initiated: 12/11/2016 Target Resolution Date: 03/13/2017 Goal Status: Active Ulcer/skin breakdown will heal within 14 weeks Date Initiated: 12/11/2016 Target Resolution Date: 03/13/2017 Goal Status: Active Interventions: Assess patient/caregiver ability to obtain necessary supplies Assess patient/caregiver ability to perform ulcer/skin care regimen upon admission and as needed Assess ulceration(s) every visit Provide education on ulcer and skin care Treatment Activities: Skin care regimen initiated : 12/11/2016 Topical wound management initiated : 12/11/2016 Notes: Electronic Signature(s) Signed: 10/03/2017 4:56:04 PM By: Montey Hora Entered By: Montey Hora on 10/03/2017 09:07:58 [name] ([id number]) -------------------------------------------------------------------------------- Pain Assessment Details Patient Name: [name] Date of Service: 10/03/2017 8:30 AM Medical Record Number: [id number] Patient Account Number: [id number] Date of Birth/Sex: [birth year] (37 y.o. M) Treating RN: Secundino Ginger Primary Care Jasimine Simms: [name] Other  Clinician: Referring Tyron Manetta: [name] Treating Xaidyn Kepner/Extender: Melburn Hake, [name]: 64 Active Problems Location of Pain Severity and Description of Pain Patient Has Paino No Site  Locations Pain Management and Medication Current Pain Management: Electronic Signature(s) Signed: 10/07/2017 3:46:25 PM By: Secundino Ginger Entered By: Secundino Ginger on 10/03/2017 08:46:56 [name] ([id number]) -------------------------------------------------------------------------------- Patient/Caregiver Education Details Patient Name: [name] Date of Service: 10/03/2017 8:30 AM Medical Record Number: [id number] Patient Account Number: [id number] Date of Birth/Gender: [birth year] (36 y.o. M) Treating RN: Montey Hora Primary Care Physician: [name] Other Clinician: Referring Physician: [name] Treating Physician/Extender: Sharalyn Ink in Treatment: 39 Education Assessment Education Provided To: Patient Education Topics Provided Wound/Skin Impairment: Handouts: Other: wound care as ordered Methods: Explain/Verbal Responses: State content correctly Electronic Signature(s) Signed: 10/03/2017 4:56:04 PM By: Montey Hora Entered By: Montey Hora on 10/03/2017 10:12:44 [name] ([id number]) -------------------------------------------------------------------------------- Wound Assessment Details Patient Name: [name] Date of Service: 10/03/2017 8:30 AM Medical Record Number: [id number] Patient Account Number: [id number] Date of Birth/Sex: [birth year] (36 y.o. M) Treating RN: Secundino Ginger Primary Care Loyce Flaming: [name] Other Clinician: Referring Evalena Fujii: [name] Treating Davidson Palmieri/Extender: Melburn Hake, [name]: 53 Wound Status Wound Number: 1 Primary Etiology: Pyoderma Wound Location: Left Lower Leg - Lateral Wound Status: Open Wounding Event: Gradually Appeared Comorbid History: Sleep Apnea, Hypertension, Colitis Date Acquired: 11/18/2015 Weeks Of Treatment: 43 Clustered Wound: No Photos Photo Uploaded By: Secundino Ginger on 10/03/2017 09:03:32 Wound Measurements Length: (cm) 3.3 Width: (cm) 1.5 Depth: (cm)  0.3 Area: (cm) 3.888 Volume: (cm) 1.166 % Reduction in Area: 20.8% % Reduction in Volume: 70.3% Epithelialization: Medium (34-66%) Tunneling: No Undermining: No Wound Description Full Thickness Without Exposed Support Foul Odo Classification: Structures Slough/F Wound Margin: Distinct, outline attached Exudate Medium Amount: Exudate Type: Serous Exudate Color: amber r After Cleansing: No ibrino Yes Wound Bed Granulation Amount: Small (1-33%) Exposed Structure Granulation Quality: Red Fascia Exposed: No Necrotic Amount: Large (67-100%) Fat Layer (Subcutaneous Tissue) Exposed: Yes Necrotic Quality: Adherent Slough Tendon Exposed: No Muscle Exposed: No Joint Exposed: No Bone Exposed: No [name], [name] ([id number]) Periwound Skin Texture Texture Color No Abnormalities Noted: No No Abnormalities Noted: No Callus: No Atrophie Blanche: No Crepitus: No Cyanosis: No Excoriation: No Ecchymosis: No Induration: Yes Erythema: Yes Rash: No Erythema Location: Circumferential Scarring: No Erythema Change: Increased Hemosiderin Staining: Yes Moisture Mottled: No No Abnormalities Noted: No Pallor: No Dry / Scaly: No Rubor: No Maceration: No Temperature / Pain Temperature: No Abnormality Tenderness on Palpation: Yes Wound Preparation Ulcer Cleansing: Wound Cleanser Topical Anesthetic Applied: Other: Lidocaine 4%, Treatment Notes Wound #1 (Left, Lateral Lower Leg) 1. Cleansed with: Clean wound with Normal Saline 2. Anesthetic Topical Lidocaine 4% cream to wound bed prior to debridement 3. Peri-wound Care: Skin Prep 4. Dressing Applied: Santyl Ointment 5. Secondary Dressing Applied Bordered Foam Dressing Dry Gauze Electronic Signature(s) Signed: 10/07/2017 3:46:25 PM By: Secundino Ginger Entered By: Secundino Ginger on 10/03/2017 08:52:25 [name] ([id number]) -------------------------------------------------------------------------------- Vitals Details Patient  Name: [name] Date of Service: 10/03/2017 8:30 AM Medical Record Number: [id number] Patient Account Number: [id number] Date of Birth/Sex: [birth year] (36 y.o. M) Treating RN: Secundino Ginger Primary Care Julia Kulzer: [name] Other Clinician: Referring Rosendo Couser: [name] Treating Telicia Hodgkiss/Extender: Melburn Hake, [name]: 53 Vital Signs Time Taken: 08:40 Temperature (F): 97.9 Height (in): 71 Pulse (bpm): 89 Weight (lbs): 338 Respiratory Rate (breaths/min): 16 Body Mass Index (BMI): 47.1 Blood Pressure (mmHg): 139/91 Reference Range: 80 - 120 mg /  dl Electronic Signature(s) Signed: 10/07/2017 3:46:25 PM By: Secundino Ginger Entered By: Secundino Ginger on 10/03/2017 08:47:52

## 2017-10-14 MED ORDER — PREDNISONE 20 MG TABLET
ORAL_TABLET | ORAL | 0 refills | 0.00000 days | Status: CP
Start: 2017-10-14 — End: 2017-11-26

## 2017-10-15 NOTE — Progress Notes (Signed)
RAYNER, ERMAN ([id number]) Visit Report for 09/19/2017 Arrival Information Details Patient Name: ADONYS, WILDES Date of Service: 09/19/2017 8:00 AM Medical Record Number: [id number] Patient Account Number: [id number] Date of Birth/Sex: 06-19-1980 (37 y.o. M) Treating RN: Montey Hora Primary Care Alekhya Gravlin: [name] Other Clinician: Referring Orey Moure: [name] Treating Bandon Sherwin/Extender: Melburn Hake, [name]: 17 Visit Information History Since Last Visit Added or deleted any medications: No Patient Arrived: Ambulatory Any new allergies or adverse reactions: No Arrival Time: 08:09 Had a fall or experienced change in No Accompanied By: self activities of daily living that may affect Transfer Assistance: None risk of falls: Patient Identification Verified: Yes Signs or symptoms of abuse/neglect since last visito No Secondary Verification Process Completed: Yes Hospitalized since last visit: No Patient Requires Transmission-Based No Implantable device outside of the clinic excluding No Precautions: cellular tissue based products placed in the center Patient Has Alerts: No since last visit: Pain Present Now: No Electronic Signature(s) Signed: 10/15/2017 7:45:54 AM By: Harold Barban Entered By: Harold Barban on 09/19/2017 08:10:00 [name] ([id number]) -------------------------------------------------------------------------------- Encounter Discharge Information Details Patient Name: [name] Date of Service: 09/19/2017 8:00 AM Medical Record Number: [id number] Patient Account Number: [id number] Date of Birth/Sex: [birth year] (36 y.o. M) Treating RN: Montey Hora Primary Care Aryan Sparks: [name] Other Clinician: Referring Aarron Wierzbicki: [name] Treating Rashod Gougeon/Extender: Melburn Hake, [name]: 13 Encounter Discharge Information Items Discharge Pain Level: 0 Discharge Condition: Stable Ambulatory Status:  Ambulatory Discharge Destination: Home Transportation: Private Auto Accompanied By: self Schedule Follow-up Appointment: Yes Medication Reconciliation completed and No provided to Patient/Care Gonsalo Cuthbertson: Patient Clinical Summary of Care: Declined Electronic Signature(s) Signed: 09/24/2017 10:09:43 AM By: Ruthine Dose Entered By: Ruthine Dose on 09/19/2017 08:41:05 [name] ([id number]) -------------------------------------------------------------------------------- Lower Extremity Assessment Details Patient Name: [name] Date of Service: 09/19/2017 8:00 AM Medical Record Number: [id number] Patient Account Number: [id number] Date of Birth/Sex: 08/12/1980 (36 y.o. M) Treating RN: Montey Hora Primary Care Tevion Laforge: [name] Other Clinician: Referring Keyaan Lederman: [name] Treating Tima Curet/Extender: Melburn Hake, [name]: 41 Edema Assessment Assessed: [Left: No] [Right: No] Edema: [Left: N] [Right: o] Vascular Assessment Pulses: Dorsalis Pedis Palpable: [Left:Yes] Posterior Tibial Palpable: [Left:Yes] Extremity colors, hair growth, and conditions: Hair Growth on Extremity: [Left:Yes] Temperature of Extremity: [Left:Warm] Capillary Refill: [Left:< 3 seconds] Electronic Signature(s) Signed: 09/19/2017 4:48:08 PM By: Montey Hora Signed: 10/15/2017 7:45:54 AM By: Harold Barban Entered By: Harold Barban on 09/19/2017 08:18:21 [name] ([id number]) -------------------------------------------------------------------------------- Multi Wound Chart Details Patient Name: [name] Date of Service: 09/19/2017 8:00 AM Medical Record Number: [id number] Patient Account Number: [id number] Date of Birth/Sex: [birth year] (37 y.o. 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] [N/A:N/A] Comorbid History: [1:Sleep Apnea, Hypertension, Colitis] [N/A:N/A] Date Acquired: [1:11/18/2015] [N/A:N/A] Weeks of Treatment: [1:41] [N/A:N/A] Wound Status: [1:Open] [N/A:N/A] Measurements L x W x D [1:6.2x6x0.2] [N/A:N/A] (cm) Area (cm) : [1:29.217] [N/A:N/A] Volume (cm) : [1:5.843] [N/A:N/A] % Reduction in Area: [1:-495.20%] [N/A:N/A] % Reduction in Volume: [1:-48.80%] [N/A:N/A] Classification: [1:Full Thickness Without Exposed Support Structures] [N/A:N/A] Exudate Amount: [1:Medium] [N/A:N/A] Exudate Type: [1:Serosanguineous] [N/A:N/A] Exudate Color: [1:red, brown] [N/A:N/A] Wound Margin: [1:Distinct, outline attached] [N/A:N/A] Granulation Amount: [1:Small (1-33%)] [  N/A:N/A] Granulation Quality: [1:Red] [N/A:N/A] Necrotic Amount: [1:Large (67-100%)] [N/A:N/A] Exposed Structures: [1:Fat Layer (Subcutaneous Tissue) Exposed: Yes Fascia: No Tendon: No Muscle: No Joint: No Bone: No] [N/A:N/A] Epithelialization: [1:Medium (34-66%)] [N/A:N/A] Periwound Skin Texture: [1:Induration: Yes Excoriation: No Callus: No Crepitus: No] [N/A:N/A] Rash: No Scarring: No Periwound Skin Moisture: Maceration: No N/A N/A Dry/Scaly: No Periwound Skin Color: Erythema: Yes N/A N/A Hemosiderin Staining: Yes Atrophie Blanche: No Cyanosis: No Ecchymosis: No Mottled: No Pallor: No Rubor: No Erythema Location: Red Streaks N/A N/A Erythema Change: Increased N/A N/A Temperature: No Abnormality N/A N/A Tenderness on Palpation: Yes N/A N/A Wound Preparation: Ulcer Cleansing: Wound N/A N/A Cleanser Topical Anesthetic Applied: Other: Lidocaine 4% Treatment  Notes Electronic Signature(s) Signed: 09/19/2017 4:48:08 PM By: Montey Hora Entered By: Montey Hora on 09/19/2017 08:28:33 PRENTICE, SACKRIDER ([id number]) -------------------------------------------------------------------------------- Multi-Disciplinary Care Plan Details Patient Name: [name] Date of Service: 09/19/2017 8:00 AM Medical Record Number: [id number] Patient Account Number: [id number] Date of Birth/Sex: [birth year] (36 y.o. M) Treating RN: Montey Hora Primary Care TRUE Shackleford: [name] Other Clinician: Referring Matrice Herro: [name] Treating Shemekia Patane/Extender: Melburn Hake, [name]: 59 Active Inactive ` Orientation to the Wound Care Program Nursing Diagnoses: Knowledge deficit related to the wound healing center program Goals: Patient/caregiver will verbalize understanding of the [facility] Date Initiated: 12/11/2016 Target Resolution Date: 03/13/2017 Goal Status: Active Interventions: Provide education on orientation to the wound center Notes: ` Venous Leg Ulcer Nursing Diagnoses: Knowledge deficit related to disease process and management Potential for venous Insuffiency (use before diagnosis confirmed) Goals: Non-invasive venous studies are completed as ordered Date Initiated: 12/11/2016 Target Resolution Date: 03/13/2017 Goal Status: Active Patient will maintain optimal edema control Date Initiated: 12/11/2016 Target Resolution Date: 03/13/2017 Goal Status: Active Patient/caregiver will verbalize understanding of disease process and disease management Date Initiated: 12/11/2016 Target Resolution Date: 03/13/2017 Goal Status: Active Verify adequate tissue perfusion prior to therapeutic compression application Date Initiated: 12/11/2016 Target Resolution Date: 03/13/2017 Goal Status: Active Interventions: Assess peripheral edema status every visit. Compression as ordered Provide education on venous  insufficiency Treatment Activities: BRIAR, WITHERSPOON ([id number]) Therapeutic compression applied : 12/11/2016 Notes: ` Wound/Skin Impairment Nursing Diagnoses: Impaired tissue integrity Knowledge deficit related to ulceration/compromised skin integrity Goals: Patient/caregiver will verbalize understanding of skin care regimen Date Initiated: 12/11/2016 Target Resolution Date: 03/13/2017 Goal Status: Active Ulcer/skin breakdown will have a volume reduction of 30% by week 4 Date Initiated: 12/11/2016 Target Resolution Date: 03/13/2017 Goal Status: Active Ulcer/skin breakdown will have a volume reduction of 50% by week 8 Date Initiated: 12/11/2016 Target Resolution Date: 03/13/2017 Goal Status: Active Ulcer/skin breakdown will have a volume reduction of 80% by week 12 Date Initiated: 12/11/2016 Target Resolution Date: 03/13/2017 Goal Status: Active Ulcer/skin breakdown will heal within 14 weeks Date Initiated: 12/11/2016 Target Resolution Date: 03/13/2017 Goal Status: Active Interventions: Assess patient/caregiver ability to obtain necessary supplies Assess patient/caregiver ability to perform ulcer/skin care regimen upon admission and as needed Assess ulceration(s) every visit Provide education on ulcer and skin care Treatment Activities: Skin care regimen initiated : 12/11/2016 Topical wound management initiated : 12/11/2016 Notes: Electronic Signature(s) Signed: 09/19/2017 4:48:08 PM By: Montey Hora Entered By: Montey Hora on 09/19/2017 64:40:34 [name] ([id number]) -------------------------------------------------------------------------------- Pain Assessment Details Patient Name: [name] Date of Service: 09/19/2017 8:00 AM Medical Record Number: [id number] Patient Account Number: [id number] Date of Birth/Sex: 1980/06/02 (36 y.o. M) Treating RN: Montey Hora Primary Care Corynne Scibilia: [name] Other Clinician: Referring Muriah Harsha: [name] Treating  Halea Lieb/Extender: Melburn Hake, HOYT Weeks  in Treatment: 41 Active Problems Location of Pain Severity and Description of Pain Patient Has Paino No Site Locations With Dressing Change: No Rate the pain. Current Pain Level: 0 Worst Pain Level: 8 Least Pain Level: 0 Tolerable Pain Level: 8 Pain Management and Medication Current Pain Management: Electronic Signature(s) Signed: 09/19/2017 4:48:08 PM By: Montey Hora Signed: 10/15/2017 7:45:54 AM By: Harold Barban Entered By: Harold Barban on 09/19/2017 08:11:16 [name] ([id number]) -------------------------------------------------------------------------------- Patient/Caregiver Education Details Patient Name: [name] Date of Service: 09/19/2017 8:00 AM Medical Record Number: [id number] Patient Account Number: [id number] Date of Birth/Gender: 03/28/1981 (36 y.o. M) Treating RN: Montey Hora Primary Care Physician: [name] Other Clinician: Referring Physician: [name] Treating Physician/Extender: Sharalyn Ink in Treatment: 41 Education Assessment Education Provided To: Patient Education Topics Provided Wound/Skin Impairment: Handouts: Other: wound care as ordered Methods: Demonstration, Explain/Verbal Responses: State content correctly Electronic Signature(s) Signed: 09/19/2017 4:48:08 PM By: Montey Hora Entered By: Montey Hora on 09/19/2017 08:37:48 [name] ([id number]) -------------------------------------------------------------------------------- Wound Assessment Details Patient Name: [name] Date of Service: 09/19/2017 8:00 AM Medical Record Number: [id number] Patient Account Number: [id number] Date of Birth/Sex: 11-29-1980 (36 y.o. M) Treating RN: Montey Hora Primary Care Takina Busser: [name] Other Clinician: Referring Keisuke Hollabaugh: [name] Treating Deetra Booton/Extender: Melburn Hake, [name]: 41 Wound Status Wound Number: 1 Primary Etiology:  Pyoderma Wound Location: Left Lower Leg - Lateral Wound Status: Open Wounding Event: Gradually Appeared Comorbid History: Sleep Apnea, Hypertension, Colitis Date Acquired: 11/18/2015 Weeks Of Treatment: 41 Clustered Wound: No Photos Photo Uploaded By: [name], [name], RN, [name], [name] on 09/25/2017 15:25:22 Wound Measurements Length: (cm) 6.2 Width: (cm) 6 Depth: (cm) 0.2 Area: (cm) 29.217 Volume: (cm) 5.843 % Reduction in Area: -495.2% % Reduction in Volume: -48.8% Epithelialization: Medium (34-66%) Tunneling: No Undermining: No Wound Description Full Thickness Without Exposed Support Foul Odo Classification: Structures Slough/F Wound Margin: Distinct, outline attached Exudate Medium Amount: Exudate Type: Serosanguineous Exudate Color: red, brown r After Cleansing: No ibrino Yes Wound Bed Granulation Amount: Small (1-33%) Exposed Structure Granulation Quality: Red Fascia Exposed: No Necrotic Amount: Large (67-100%) Fat Layer (Subcutaneous Tissue) Exposed: Yes Necrotic Quality: Adherent Slough Tendon Exposed: No Muscle Exposed: No Joint Exposed: No Bone Exposed: No Dieckman, Jyren ([id number]) Periwound Skin Texture Texture Color No Abnormalities Noted: No No Abnormalities Noted: No Callus: No Atrophie Blanche: No Crepitus: No Cyanosis: No Excoriation: No Ecchymosis: No Induration: Yes Erythema: Yes Rash: No Erythema Location: Red Streaks Scarring: No Erythema Change: Increased Hemosiderin Staining: Yes Moisture Mottled: No No Abnormalities Noted: No Pallor: No Dry / Scaly: No Rubor: No Maceration: No Temperature / Pain Temperature: No Abnormality Tenderness on Palpation: Yes Wound Preparation Ulcer Cleansing: Wound Cleanser Topical Anesthetic Applied: Other: Lidocaine 4%, Electronic Signature(s) Signed: 09/19/2017 4:48:08 PM By: Montey Hora Signed: 10/15/2017 7:45:54 AM By: Harold Barban Entered By: Harold Barban on 09/19/2017 08:17:06 KEISHAUN, HAZEL ([id number]) -------------------------------------------------------------------------------- Vitals Details Patient Name: [name] Date of Service: 09/19/2017 8:00 AM Medical Record Number: [id number] Patient Account Number: [id number] Date of Birth/Sex: 1981-03-03 (36 y.o. M) Treating RN: Montey Hora Primary Care Wylee Ogden: [name] Other Clinician: Referring Casyn Becvar: [name] Treating Obe Ahlers/Extender: Melburn Hake, [name]: 40 Vital Signs Time Taken: 08:11 Temperature (F): 98.1 Height (in): 71 Pulse (bpm): 71 Weight (lbs): 338 Respiratory Rate (breaths/min): 16 Body Mass Index (BMI): 47.1 Blood Pressure (mmHg): 140/84 Reference Range: 80 - 120 mg / dl Electronic Signature(s) Signed: 10/15/2017 7:45:54 AM By: Harold Barban Entered By: Harold Barban on 09/19/2017  08:11:39 

## 2017-10-17 ENCOUNTER — Encounter: Payer: 59 | Admitting: Physician Assistant

## 2017-10-17 DIAGNOSIS — L88 Pyoderma gangrenosum: Secondary | ICD-10-CM | POA: Diagnosis not present

## 2017-10-17 DIAGNOSIS — L97822 Non-pressure chronic ulcer of other part of left lower leg with fat layer exposed: Secondary | ICD-10-CM | POA: Diagnosis not present

## 2017-10-17 DIAGNOSIS — I872 Venous insufficiency (chronic) (peripheral): Secondary | ICD-10-CM | POA: Diagnosis not present

## 2017-10-19 NOTE — Progress Notes (Signed)
BENNO, BRENSINGER ([id number]) Visit Report for 10/17/2017 Arrival Information Details Patient Name: [name], [name] Date of Service: 10/17/2017 8:30 AM Medical Record Number: [id number] Patient Account Number: [id number] Date of Birth/Sex: 03-18-1981 (37 y.o. M) Treating RN: [name] Primary Care Geordie Nooney: [name] Other Clinician: Referring Caitriona Sundquist: [name] Treating Corian Handley/Extender: Melburn Hake, [name]: 20 Visit Information History Since Last Visit Added or deleted any medications: No Patient Arrived: Ambulatory Any new allergies or adverse reactions: No Arrival Time: 08:49 Had a fall or experienced change in No Accompanied By: self activities of daily living that may affect Transfer Assistance: None risk of falls: Patient Identification Verified: Yes Signs or symptoms of abuse/neglect since last visito No Secondary Verification Process Completed: Yes Hospitalized since last visit: No Patient Requires Transmission-Based No Implantable device outside of the clinic excluding No Precautions: cellular tissue based products placed in the center Patient Has Alerts: No since last visit: Has Dressing in Place as Prescribed: Yes Has Footwear/Offloading in Place as Prescribed: Yes Left: Regular Shoe Right: Regular Shoe Pain Present Now: Yes Electronic Signature(s) Signed: 10/17/2017 5:30:19 PM By: [name], [name], RN, [name], [name], [name] Entered By: [name], [name], RN, [name], [name] on 10/17/2017 08:50:06 [name] ([id number]) -------------------------------------------------------------------------------- Encounter Discharge Information Details Patient Name: [name] Date of Service: 10/17/2017 8:30 AM Medical Record Number: [id number] Patient Account Number: [id number] Date of Birth/Sex: 10/08/1980 (37 y.o. M) Treating RN: Montey Hora Primary Care Keaun Schnabel: [name] Other Clinician: Referring Shaquile Lutze: [name] Treating Ponce Skillman/Extender:  Melburn Hake, [name]: 39 Encounter Discharge Information Items Discharge Condition: Stable Ambulatory Status: Ambulatory Discharge Destination: Home Transportation: Private Auto Accompanied By: self Schedule Follow-up Appointment: Yes Clinical Summary of Care: Electronic Signature(s) Signed: 10/17/2017 5:45:37 PM By: Montey Hora Entered By: Montey Hora on 10/17/2017 09:11:50 [name] ([id number]) -------------------------------------------------------------------------------- Lower Extremity Assessment Details Patient Name: [name] Date of Service: 10/17/2017 8:30 AM Medical Record Number: [id number] Patient Account Number: [id number] Date of Birth/Sex: 10-Jun-1980 (37 y.o. M) Treating RN: [name] Primary Care Raiden Haydu: [name] Other Clinician: Referring Davarius Ridener: [name] Treating Yoneko Talerico/Extender: Melburn Hake, [name]: 45 Edema Assessment Assessed: [Left: No] [Right: No] Edema: [Left: Ye] [Right: s] Vascular Assessment Claudication: Claudication Assessment [Left:None] Pulses: Dorsalis Pedis Palpable: [Left:Yes] Posterior Tibial Extremity colors, hair growth, and conditions: Extremity Color: [Left:Hyperpigmented] Temperature of Extremity: [Left:Warm] Capillary Refill: [Left:< 3 seconds] Toe Nail Assessment Left: Right: Thick: No Discolored: No Deformed: No Improper Length and Hygiene: No Electronic Signature(s) Signed: 10/17/2017 5:30:19 PM By: [name], [name], RN, [name], [name], [name] Entered By: [name], [name], RN, [name], [name] on 10/17/2017 08:52:56 [name] ([id number]) -------------------------------------------------------------------------------- Multi Wound Chart Details Patient Name: [name] Date of Service: 10/17/2017 8:30 AM Medical Record Number: [id number] Patient Account Number: [id number] Date of Birth/Sex: 07/16/1980 (37 y.o. 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] [N/A:N/A] Comorbid History: [1:Sleep Apnea, Hypertension, Colitis] [N/A:N/A] Date Acquired: [1:11/18/2015] [N/A:N/A] Weeks of Treatment: [1:45] [N/A:N/A] Wound Status: [1:Open] [N/A:N/A] Measurements L x W x D [1:2.2x2x0.2] [N/A:N/A] (cm) Area (cm) : [1:3.456] [N/A:N/A] Volume (cm) : [1:0.691] [N/A:N/A] % Reduction in Area: [1:29.60%] [N/A:N/A] % Reduction in Volume: [1:82.40%] [N/A:N/A] Classification: [  1:Full Thickness Without Exposed Support Structures] [N/A:N/A] Exudate Amount: [1:Medium] [N/A:N/A] Exudate Type: [1:Serous] [N/A:N/A] Exudate Color: [1:amber] [N/A:N/A] Wound Margin: [1:Distinct, outline attached] [N/A:N/A] Granulation Amount: [1:Small (1-33%)] [N/A:N/A] Granulation Quality: [1:Red] [N/A:N/A] Necrotic Amount: [1:Large (67-100%)] [N/A:N/A] Exposed Structures: [1:Fat Layer (Subcutaneous Tissue) Exposed: Yes Fascia: No Tendon: No Muscle: No Joint: No Bone: No] [N/A:N/A] Epithelialization: [1:Medium (34-66%)] [N/A:N/A] Periwound Skin Texture: [1:Induration: Yes Excoriation: No Callus: No Crepitus: No] [N/A:N/A] Rash: No Scarring: No Periwound Skin Moisture: Maceration: No N/A N/A Dry/Scaly: No Periwound Skin Color: Erythema: Yes N/A N/A Hemosiderin Staining: Yes Atrophie Blanche: No Cyanosis: No Ecchymosis: No Mottled: No Pallor: No Rubor: No Erythema Location: Circumferential N/A N/A Erythema Change: Increased N/A N/A Temperature: No Abnormality N/A N/A Tenderness on Palpation: Yes N/A  N/A Wound Preparation: Ulcer Cleansing: Wound N/A N/A Cleanser Topical Anesthetic Applied: Other: Lidocaine 4% Treatment Notes Electronic Signature(s) Signed: 10/17/2017 5:45:37 PM By: Montey Hora Entered By: Montey Hora on 10/17/2017 09:05:34 [name] ([id number]) -------------------------------------------------------------------------------- Multi-Disciplinary Care Plan Details Patient Name: [name] Date of Service: 10/17/2017 8:30 AM Medical Record Number: [id number] Patient Account Number: [id number] Date of Birth/Sex: 05-28-1980 (36 y.o. M) Treating RN: Montey Hora Primary Care Shannon Balthazar: [name] Other Clinician: Referring Abbas Beyene: [name] Treating Chaya Dehaan/Extender: Melburn Hake, [name]: 39 Active Inactive ` Orientation to the Wound Care Program Nursing Diagnoses: Knowledge deficit related to the wound healing center program Goals: Patient/caregiver will verbalize understanding of the [facility] Date Initiated: 12/11/2016 Target Resolution Date: 03/13/2017 Goal Status: Active Interventions: Provide education on orientation to the wound center Notes: ` Venous Leg Ulcer Nursing Diagnoses: Knowledge deficit related to disease process and management Potential for venous Insuffiency (use before diagnosis confirmed) Goals: Non-invasive venous studies are completed as ordered Date Initiated: 12/11/2016 Target Resolution Date: 03/13/2017 Goal Status: Active Patient will maintain optimal edema control Date Initiated: 12/11/2016 Target Resolution Date: 03/13/2017 Goal Status: Active Patient/caregiver will verbalize understanding of disease process and disease management Date Initiated: 12/11/2016 Target Resolution Date: 03/13/2017 Goal Status: Active Verify adequate tissue perfusion prior to therapeutic compression application Date Initiated: 12/11/2016 Target Resolution Date: 03/13/2017 Goal Status:  Active Interventions: Assess peripheral edema status every visit. Compression as ordered Provide education on venous insufficiency Treatment Activities: STYLES, FAMBRO ([id number]) Therapeutic compression applied : 12/11/2016 Notes: ` Wound/Skin Impairment Nursing Diagnoses: Impaired tissue integrity Knowledge deficit related to ulceration/compromised skin integrity Goals: Patient/caregiver will verbalize understanding of skin care regimen Date Initiated: 12/11/2016 Target Resolution Date: 03/13/2017 Goal Status: Active Ulcer/skin breakdown will have a volume reduction of 30% by week 4 Date Initiated: 12/11/2016 Target Resolution Date: 03/13/2017 Goal Status: Active Ulcer/skin breakdown will have a volume reduction of 50% by week 8 Date Initiated: 12/11/2016 Target Resolution Date: 03/13/2017 Goal Status: Active Ulcer/skin breakdown will have a volume reduction of 80% by week 12 Date Initiated: 12/11/2016 Target Resolution Date: 03/13/2017 Goal Status: Active Ulcer/skin breakdown will heal within 14 weeks Date Initiated: 12/11/2016 Target Resolution Date: 03/13/2017 Goal Status: Active Interventions: Assess patient/caregiver ability to obtain necessary supplies Assess patient/caregiver ability to perform ulcer/skin care regimen upon admission and as needed Assess ulceration(s) every visit Provide education on ulcer and skin care Treatment Activities: Skin care regimen initiated : 12/11/2016 Topical wound management initiated : 12/11/2016 Notes: Electronic Signature(s) Signed: 10/17/2017 5:45:37 PM By: Montey Hora Entered By: Montey Hora on 10/17/2017 09:05:24 [name] ([id number]) -------------------------------------------------------------------------------- Pain Assessment Details Patient Name: [name] Date of Service: 10/17/2017 8:30 AM Medical Record Number: [id number] Patient Account Number: [id number] Date  of Birth/Sex: January 20, 1981 (37 y.o. M) Treating RN:  [name] Primary Care Tristan Bramble: [name] Other Clinician: Referring Vaughan Garfinkle: [name] Treating Lucianna Ostlund/Extender: Melburn Hake, [name]: 5 Active Problems Location of Pain Severity and Description of Pain Patient Has Paino Yes Site Locations Rate the pain. Current Pain Level: 2 Pain Management and Medication Current Pain Management: Goals for Pain Management Topical or injectable lidocaine is offered to patient for acute pain when surgical debridement is performed. If needed, Patient is instructed to use over the counter pain medication for the following 24-48 hours after debridement. Wound care MDs do not prescribed pain medications. Patient has chronic pain or uncontrolled pain. Patient has been instructed to make an appointment with their Primary Care Physician for pain management. Electronic Signature(s) Signed: 10/17/2017 5:30:19 PM By: [name], [name], RN, [name], [name], [name] Entered By: [name], [name], RN, [name], [name] on 10/17/2017 08:50:18 [name], [name] ([id number]) -------------------------------------------------------------------------------- Patient/Caregiver Education Details Patient Name: [name] Date of Service: 10/17/2017 8:30 AM Medical Record Number: [id number] Patient Account Number: [id number] Date of Birth/Gender: 1980-10-10 (36 y.o. M) Treating RN: Montey Hora Primary Care Physician: [name] Other Clinician: Referring Physician: [name] Treating Physician/Extender: Sharalyn Ink in Treatment: 40 Education Assessment Education Provided To: Patient Education Topics Provided Wound/Skin Impairment: Handouts: Caring for Your Ulcer, Other: change dressing as ordedred Methods: Demonstration, Explain/Verbal Responses: State content correctly Electronic Signature(s) Signed: 10/17/2017 5:45:37 PM By: Montey Hora Entered By: Montey Hora on 10/17/2017 09:12:07 [name]  ([id number]) -------------------------------------------------------------------------------- Wound Assessment Details Patient Name: [name] Date of Service: 10/17/2017 8:30 AM Medical Record Number: [id number] Patient Account Number: [id number] Date of Birth/Sex: 08-07-1980 (36 y.o. M) Treating RN: [name] Primary Care Timarion Agcaoili: [name] Other Clinician: Referring Joscelin Fray: [name] Treating Melaina Howerton/Extender: Melburn Hake, [name]: 45 Wound Status Wound Number: 1 Primary Etiology: Pyoderma Wound Location: Left Lower Leg - Lateral Wound Status: Open Wounding Event: Gradually Appeared Comorbid History: Sleep Apnea, Hypertension, Colitis Date Acquired: 11/18/2015 Weeks Of Treatment: 45 Clustered Wound: No Photos Photo Uploaded By: [name], [name], RN, [name], [name] on 10/17/2017 13:57:11 Wound Measurements Length: (cm) 2.2 Width: (cm) 2 Depth: (cm) 0.2 Area: (cm) 3.456 Volume: (cm) 0.691 % Reduction in Area: 29.6% % Reduction in Volume: 82.4% Epithelialization: Medium (34-66%) Tunneling: No Undermining: No Wound Description Full Thickness Without Exposed Support Foul Odo Classification: Structures Slough/F Wound Margin: Distinct, outline attached Exudate Medium Amount: Exudate Type: Serous Exudate Color: amber r After Cleansing: No ibrino Yes Wound Bed Granulation Amount: Small (1-33%) Exposed Structure Granulation Quality: Red Fascia Exposed: No Necrotic Amount: Large (67-100%) Fat Layer (Subcutaneous Tissue) Exposed: Yes Necrotic Quality: Adherent Slough Tendon Exposed: No Muscle Exposed: No Joint Exposed: No Bone Exposed: No [name], [name] ([id number]) Periwound Skin Texture Texture Color No Abnormalities Noted: No No Abnormalities Noted: No Callus: No Atrophie Blanche: No Crepitus: No Cyanosis: No Excoriation: No Ecchymosis: No Induration: Yes Erythema: Yes Rash: No Erythema Location: Circumferential Scarring:  No Erythema Change: Increased Hemosiderin Staining: Yes Moisture Mottled: No No Abnormalities Noted: No Pallor: No Dry / Scaly: No Rubor: No Maceration: No Temperature / Pain Temperature: No Abnormality Tenderness on Palpation: Yes Wound Preparation Ulcer Cleansing: Wound Cleanser Topical Anesthetic Applied: Other: Lidocaine 4%, Treatment Notes Wound #1 (Left, Lateral Lower Leg) 1. Cleansed with: Clean wound with Normal Saline 2. Anesthetic Topical Lidocaine 4% cream to wound bed prior to debridement 3. Peri-wound Care: Skin Prep 4. Dressing Applied: Santyl Ointment 5. Secondary Dressing Applied Bordered Foam Dressing Electronic  Signature(s) Signed: 10/17/2017 5:30:19 PM By: [name], [name], RN, [name], [name], [name] Entered By: [name], [name], RN, [name], [name] on 10/17/2017 08:51:31 [name], [name] ([id number]) -------------------------------------------------------------------------------- Vitals Details Patient Name: [name] Date of Service: 10/17/2017 8:30 AM Medical Record Number: [id number] Patient Account Number: [id number] Date of Birth/Sex: 1980/06/26 (36 y.o. M) Treating RN: [name] Primary Care Mandi Mattioli: [name] Other Clinician: Referring Jamerion Cabello: [name] Treating Yamileth Hayse/Extender: Melburn Hake, [name]: 45 Vital Signs Time Taken: 08:50 Temperature (F): 98.1 Height (in): 71 Pulse (bpm): 90 Weight (lbs): 338 Respiratory Rate (breaths/min): 16 Body Mass Index (BMI): 47.1 Blood Pressure (mmHg): 168/88 Reference Range: 80 - 120 mg / dl Electronic Signature(s) Signed: 10/17/2017 5:30:19 PM By: [name], [name], RN, [name], [name], [name] Entered By: [name], [name], RN, [name], [name] on 10/17/2017 08:50:36

## 2017-10-19 NOTE — Progress Notes (Signed)
BERDELL, HOSTETLER ([id number]) Visit Report for 10/17/2017 Chief Complaint Document Details Patient Name: [name], [name] Date of Service: 10/17/2017 8:30 AM Medical Record Number: [id number] Patient Account Number: [id number] Date of Birth/Sex: January 24, 1981 (37 y.o. M) Treating RN: Montey Hora Primary Care Provider: [name] Other Clinician: Referring Provider: [name] Treating Provider/Extender: Melburn Hake, Annielee Jemmott Weeks in Treatment: 50 Information Obtained from: Patient Chief Complaint He is here in follow up evaluation for LLE pyoderma ulcer Electronic Signature(s) Signed: 10/17/2017 11:45:50 PM By: Worthy Keeler PA-C Entered By: Worthy Keeler on 10/17/2017 09:02:25 [name] ([id number]) -------------------------------------------------------------------------------- Debridement Details Patient Name: [name] Date of Service: 10/17/2017 8:30 AM Medical Record Number: [id number] Patient Account Number: [id number] Date of Birth/Sex: Sep 15, 1980 (36 y.o. M) Treating RN: Montey Hora Primary Care Provider: [name] Other Clinician: Referring Provider: [name] Treating Provider/Extender: Melburn Hake, Anique Beckley Weeks in Treatment: 45 Debridement Performed for Wound #1 Left,Lateral Lower Leg Assessment: Performed By: Clinician Montey Hora, RN Debridement Type: Chemical/Enzymatic/Mechanical Agent Used: Santyl Pre-procedure Verification/Time Yes - 09:05 Out Taken: Start Time: 09:05 Pain Control: Lidocaine 4% Topical Solution Instrument: Other : tongue depressor Bleeding: None End Time: 09:06 Procedural Pain: 0 Post Procedural Pain: 0 Response to Treatment: Procedure was tolerated well Level of Consciousness: Awake and Alert Post Procedure Vitals: Temperature: 98.1 Pulse: 90 Respiratory Rate: 16 Blood Pressure: Systolic Blood Pressure: 423 Diastolic Blood Pressure: 58 Post Debridement Measurements of Total Wound Length: (cm) 2.2 Width: (cm)  2 Depth: (cm) 0.2 Volume: (cm) 0.691 Character of Wound/Ulcer Post Debridement: Improved Post Procedure Diagnosis Same as Pre-procedure Electronic Signature(s) Signed: 10/17/2017 5:45:37 PM By: Montey Hora Signed: 10/17/2017 11:45:50 PM By: Worthy Keeler PA-C Entered By: Montey Hora on 10/17/2017 09:07:13 [name] ([id number]) -------------------------------------------------------------------------------- HPI Details Patient Name: [name] Date of Service: 10/17/2017 8:30 AM Medical Record Number: [id number] Patient Account Number: [id number] Date of Birth/Sex: 12/25/1980 (36 y.o. M) Treating RN: Montey Hora Primary Care Provider: [name] Other Clinician: Referring Provider: [name] Treating Provider/Extender: Melburn Hake, Lillianne Eick Weeks in Treatment: 19 History of Present Illness HPI Description: 12/04/16; 37 year old man who comes into the clinic today for review of a wound on the posterior left calf. He tells me that is been there for about a year. He is not a diabetic he does smoke half a pack per day. He was seen in the ER on 11/20/16 felt to have cellulitis around the wound and was given clindamycin. An x-ray did not show osteomyelitis. The patient initially tells me that he has a milk allergy that sets off a pruritic itching rash on his lower legs which she scratches incessantly and he thinks that's what may have set up the wound. He has been using various topical antibiotics and ointments without any effect. He works in a [employer]ing [employer] and is on his feet all day. He does not have a prior history of wounds however he does have the rash on both lower legs the right arm and the ventral aspect of his left arm. These are excoriations and clearly have had scratching however there are of macular looking areas on both legs including a substantial larger area on the right leg. This does not have an underlying open area. There is no blistering. The patient  tells me that 2 years ago in [place] in response to the rash on his legs he saw a dermatologist who told him he had a condition which may be pyoderma gangrenosum although I may be putting words into his mouth. He seemed  to recognize this. 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 He is not in any treatment for this. He's had no recent travel 12/11/16; the patient arrives today with his wound and roughly the same condition we've been using silver alginate this is a deep punched out wound with some surrounding erythema but no tenderness. Biopsy I did did not show confirmed pyoderma gangrenosum suggested nonspecific inflammation and vasculitis but does not provide an actual description of what was seen by the pathologist. I'm really not able to understand this We have also received information from the patient's dermatologist in [place] notes from April 2016. This was a doctor Agarwal- antal. 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 He was prescribed topical steroid high potency under occlusion which helped but at this point the patient did not have a deep punched out wound. 12/18/16; the patient's wound is larger in terms of surface area however this surface looks better and there is less depth. The surrounding erythema also is better. The patient states that the wrap we put on came off 2 days ago when he has been using his compression stockings. He we are in the process of getting a dermatology consult. 12/26/16 on evaluation today patient's left lower extremity wound shows evidence of infection with surrounding erythema noted. He has been tolerating the dressing changes but states that he has noted more discomfort. There is a larger area of erythema surrounding the wound. No fevers, chills, nausea, or vomiting noted at this time. With that being said the wound still does have slough covering the surface. He is not allergic to any medication that he  is aware of at this point. In regard to his right lower extremity he had several regions that are erythematous and pruritic he wonders if there's anything we can do to help that. 01/02/17 I reviewed patient's wound culture which was obtained his visit last week. He was placed on doxycycline at that point. Unfortunately that does not appear to be an antibiotic that would likely help with the situation however the pseudomonas noted on culture is sensitive to Cipro. Also unfortunately patient's wound seems to have a large compared to last week's evaluation. Not severely so but there are definitely increased measurements in general. He is continuing to have discomfort as well he writes this to be a seven out of 10. In fact he would prefer me not to perform any debridement today due to the fact that he is having discomfort and considering he has an active infection on the little reluctant to do so anyway. No fevers, chills, nausea, or vomiting noted at this time. 01/08/17; patient seems dermatology on September 5. I suspect dermatology will want the slides from the biopsy I did sent to their pathologist. I'm not sure if there is a way we can expedite that. In any case the culture I did before I left on vacation 3 weeks ago showed Pseudomonas he was given 10 days of Cipro and per her description of her intake nurses is actually somewhat better this week although the wound is quite a bit bigger than I remember the last time I saw this. He still has 3 more days of Cipro ADA, [place] ([id number]) 01/21/17; dermatology appointment tomorrow. He has completed the ciprofloxacin for Pseudomonas. Surface of the wound looks better however he is had some deterioration in the lesions on his right leg. Meantime the left lateral leg wound we will continue with sample 01/29/17; patient  had his dermatology appointment but I can't yet see that note. He is completed his antibiotics. The wound is more superficial but  considerably larger in circumferential area than when he came in. This is in his left lateral calf. He also has swollen erythematous areas with superficial wounds on the right leg and small papular areas on both arms. There apparently areas in her his upper thighs and buttocks I did not look at those. Dermatology biopsied the right leg. Hopefully will have their input next week. 02/05/17; patient went back to see his dermatologist who told him that he had a "scratching problem" as well as staph. He is now on a 30 day course of doxycycline and I believe she gave him triamcinolone cream to the right leg areas to help with the itching [not exactly sure but probably triamcinolone]. She apparently looked at the left lateral leg wound although this was not rebiopsied and I think felt to be ultimately part of the same pathogenesis. He is using sample border foam and changing nevus himself. He now has a new open area on the right posterior leg which was his biopsy site I don't have any of the dermatology notes 02/12/17; we put the patient in compression last week with SANTYL to the wound on the left leg and the biopsy. Edema is much better and the depth of the wound is now at level of skin. Area is still the same oBiopsy site on the right lateral leg we've also been using santyl with a border foam dressing and he is changing this himself. 02/19/17; Using silver alginate started last week to both the substantial left leg wound and the biopsy site on the right wound. He is tolerating compression well. Has a an appointment with his primary M.D. tomorrow wondering about diuretics although I'm wondering if the edema problem is actually lymphedema 02/26/17; the patient has been to see his primary doctor Dr. Jerrel Ivory at [place] our primary care. She started him on Lasix 20 mg and this seems to have helped with the edema. However we are not making substantial change with the left lateral calf wound and  inflammation. The biopsy site on the right leg also looks stable but not really all that different. 03/12/17; the patient has been to see vein and vascular Dr. Lucky Cowboy. He has had venous reflux studies I have not reviewed these. I did get a call from his dermatology office. 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 They therefore recommended clobetasol and tetracycline i.e. 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 03/26/17; apparently the patient just had reflux studies not an appointment with Dr. dew. She arrives in clinic today having applied clobetasol for 2-3 weeks. He notes over the last 2-3 days excessive drainage having to change the dressing 3-4 times a day and also expanding erythema. 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 Lillia Carmel at [facility] dermatology locally who kindly saw him at my request. 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 My concern was the degree of erythema medially extending from his left leg wound which was either inflammation from pyoderma or cellulitis. I put him on Augmentin however culture of the wound showed Pseudomonas which is quinolone sensitive. I really don't believe he has cellulitis however in view of everything I will continue and give him a course of Cipro. He is also on doxycycline as an immune modulator for the pyoderma. In addition to his original wound on the left  lateral leg with surrounding erythema he has a wound on the right posterior calf which was an original biopsy site done by dermatology. 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 Saw Dr. Lillia Carmel yesterday. He has been using topical antibiotics to both wound areas his original wound on the left and the biopsies/pathergy area on the right. There is definitely some improvement in the inflammation around the wound on the right although the patient states he has increasing sensitivity of the wounds. He is on prednisone 60 and doxycycline 1 as prescribed by Dr. Lillia Carmel. He is covering the topical antibiotic with gauze and putting this in his own compression stocks and changing this daily. He states that Dr. Lottie Rater did a culture of the left leg wound yesterday 05/07/17; pyoderma gangrenosum. The patient saw Dr. Lillia Carmel yesterday and has a follow-up with her in one month. He is still using topical antibiotics to both wounds although he can't recall exactly what type. He is still on prednisone 60 mg. Dr. Lillia Carmel stated that the doxycycline could stop if we were in agreement. 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 The right medial leg was induced by biopsy/pathergy. The area on the right is essentially healed. 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 The right medial leg has closed and remains closed. He is still on prednisone 60. oHe tells me he missed his last dermatology appointment with Dr. Lillia Carmel but will make another appointment. He reports that her blood sugar at a recent screen in [place] was high 200's. He was 180 today. He is more cushingoid blood pressure is Flippo, [place] ([id number]) up a bit. I think he is going to require  still much longer prednisone perhaps another 3 months before attempting to taper. In the meantime his wound is a lot better. Smaller. He is cleaning this off daily and applying topical antibiotics. When he was last in the clinic I thought about changing to [facility] and actually put in a couple of calls to dermatology although probably not during their business hours. 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 He continues on oral prednisone. He has been using triple antibiotic ointment. There is surface debris and we will transition to [facility] and have him return in 2 weeks. He has lost 30 pounds since his last appointment with lifestyle modification. He may benefit from topical steroid cream for treatment this can be considered at a later date. 08/22/17 on evaluation today patient appears to actually be doing rather well in regard to his left lateral lower extremity ulcer. He has actually been managed by Dr. Dellia Nims most recently. Patient is currently  on oral steroids at this time. This seems to have been of benefit for him. Nonetheless his last visit was actually with Leah on 08/06/17. Currently he is not utilizing any topical steroid creams although this could be of benefit as well. No fevers, chills, nausea, or vomiting noted at this time. 09/05/17 on evaluation today patient appears to be doing better in regard to his left lateral lower extremity ulcer. He has been tolerating the dressing changes without complication. He is using Santyl with good effect. Overall I'm very pleased with how things are standing at this point. Patient likewise is happy that this is doing better. 09/19/17 on evaluation today patient actually appears to be doing rather well in regard to his left lateral lower extremity ulcer. Again this is secondary to Pyoderma gangrenosum and he seems to be  progressing well with the Santyl which is good news. He's not having any significant pain. 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 He has been tolerating the Santyl without complication and in general I feel like he's making good progress. 10/17/17 on evaluation today patient appears to be doing very well in regard to his left lateral lower surety ulcer. He has been tolerating the dressing changes without complication. There does not appear to be any evidence of infection he's alternating the Santyl and the triple antibiotic ointment every other day this seems to be doing well for him. Electronic Signature(s) Signed: 10/17/2017 11:45:50 PM By: Worthy Keeler PA-C Entered By: Worthy Keeler on 10/17/2017 09:07:41 SADAO, WEYER ([id number]) -------------------------------------------------------------------------------- Physical Exam Details Patient Name: [name] Date of Service: 10/17/2017 8:30 AM Medical Record Number: [id number] Patient Account Number: [id number] Date of Birth/Sex: [birth year] (36 y.o. M) Treating RN: Montey Hora Primary Care Provider: [name] Other Clinician: Referring Provider: [name] Treating Provider/Extender: Melburn Hake, Eryka Dolinger Weeks in Treatment: 46 Constitutional Well-nourished and well-hydrated in no acute distress. Respiratory normal breathing without difficulty. clear to auscultation bilaterally. Cardiovascular regular rate and rhythm with normal S1, S2. Psychiatric this patient is able to make decisions and demonstrates good insight into disease process. Alert and Oriented x 3. pleasant and cooperative. Notes Patient's wound shows evidence of great epithelialization and seems to be filling in quite nicely. Overall I'm extremely pleased with how things are progressing at this point in time patient is also pleased. He does have  a dermatology appointment on Monday. Electronic Signature(s) Signed: 10/17/2017 11:45:50 PM By: Worthy Keeler PA-C Entered By: Worthy Keeler on 10/17/2017 09:08:19 [name], [name] ([id number]) -------------------------------------------------------------------------------- Physician Orders Details Patient Name: [name] Date of Service: 10/17/2017 8:30 AM Medical Record Number: [id number] Patient Account Number: [id number] Date of Birth/Sex: 03-Feb-1981 (36 y.o. M) Treating RN: Montey Hora Primary Care Provider: [name] Other Clinician: Referring Provider: [name] Treating Provider/Extender: Melburn Hake, Indiyah Paone Weeks in Treatment: 17 Verbal / Phone Orders: No Diagnosis Coding ICD-10 Coding Code Description [phone] 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sis of muscle L88 Pyoderma gangrenosum I87.2 Venous insufficiency (chronic) (peripheral) Wound Cleansing Wound #1 Left,Lateral Lower Leg o Clean wound with Normal Saline. Anesthetic (add to Medication List) Wound #1 Left,Lateral Lower Leg o Topical Lidocaine 4% cream applied to wound bed prior to debridement (In Clinic Only). Primary Wound Dressing Wound #1 Left,Lateral Lower Leg o Santyl Ointment Secondary Dressing Wound #1 Left,Lateral Lower Leg o XtraSorb - secure with tape Dressing Change Frequency Wound #1 Left,Lateral Lower Leg   o Change dressing every day. Follow-up Appointments Wound #1 Left,Lateral Lower Leg o Return Appointment in 2 weeks. Edema Control Wound #1 Left,Lateral Lower Leg o Patient to wear own compression stockings Electronic Signature(s) Signed: 10/17/2017 5:45:37 PM By: Montey Hora Signed: 10/17/2017 11:45:50 PM By: Worthy Keeler PA-C Entered By: Montey Hora on 10/17/2017 09:09:53 Derks, [name] ([id number]) MARLEE, TRENTMAN ([id number]) -------------------------------------------------------------------------------- Problem List Details Patient Name:  [name] Date of Service: 10/17/2017 8:30 AM Medical Record Number: [id number] Patient Account Number: [id number] Date of Birth/Sex: 03/07/1981 (37 y.o. M) Treating RN: Montey Hora Primary Care Provider: [name] Other Clinician: Referring Provider: [name] Treating Provider/Extender: Melburn Hake, Derrall Hicks Weeks in Treatment: 29 Active Problems ICD-10 Impacting Encounter Code Description Active Date Wound Healing Diagnosis L97.223 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sis of muscle 12/04/2016 No Yes L88 Pyoderma gangrenosum 03/26/2017 No Yes I87.2 Venous insufficiency (chronic) (peripheral) 12/04/2016 No Yes Inactive Problems ICD-10 Code Description Active Date Inactive Date L97.213 Non-pressure chronic ulcer of right calf with necrosis of muscle 04/02/2017 04/02/2017 Resolved Problems Electronic Signature(s) Signed: 10/17/2017 11:45:50 PM By: Worthy Keeler PA-C Entered By: Worthy Keeler on 10/17/2017 09:02:19 [name] ([id number]) -------------------------------------------------------------------------------- Progress Note Details Patient Name: [name] Date of Service: 10/17/2017 8:30 AM Medical Record Number: [id number] Patient Account Number: [id number] Date of Birth/Sex: Jun 04, 1980 (36 y.o. M) Treating RN: Montey Hora Primary Care Provider: [name] Other Clinician: Referring Provider: [name] Treating Provider/Extender: Melburn Hake, Kiree Dejarnette Weeks in Treatment: 15 Subjective Chief Complaint Information obtained from Patient He is here in follow up evaluation for LLE pyoderma ulcer History of Present Illness (HPI) 12/04/16; 37 year old man who comes into the clinic today for review of a wound on the posterior left calf. He tells me that is been there for about a year. He is not a diabetic he does smoke half a pack per day. He was seen in the ER on 11/20/16 felt to have cellulitis around the wound and was given clindamycin.  An x-ray did not show osteomyelitis. The patient initially tells me that he has a milk allergy that sets off a pruritic itching rash on his lower legs which she scratches incessantly and he thinks that's what may have set up the wound. He has been using various topical antibiotics and ointments without any effect. He works in a [employer]ing [employer] and is on his feet all day. He does not have a prior history of wounds however he does have the rash on both lower legs the right arm and the ventral aspect of his left arm. These are excoriations and clearly have had scratching however there are of macular looking areas on both legs including a substantial larger area on the right leg. This does not have an underlying open area. There is no blistering. The patient tells me that 2 years ago in [place] in response to the rash on his legs he saw a dermatologist who told him he had a condition which may be pyoderma gangrenosum although I may be putting words into his mouth. He seemed to recognize this. 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 He is not in any treatment for this. He's had no recent travel 12/11/16; the patient arrives today with his wound and roughly the same condition we've been using silver alginate this is a deep punched out wound with some surrounding erythema but no tenderness. Biopsy I did did not show confirmed pyoderma gangrenosum suggested nonspecific inflammation and vasculitis  but does not provide an actual description of what was seen by the pathologist. I'm really not able to understand this We have also received information from the patient's dermatologist in [place] notes from April 2016. This was a doctor Agarwal- antal. 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 He was prescribed topical steroid high potency under occlusion which helped but at this point the patient did not have a deep punched out wound. 12/18/16; the patient's  wound is larger in terms of surface area however this surface looks better and there is less depth. The surrounding erythema also is better. The patient states that the wrap we put on came off 2 days ago when he has been using his compression stockings. He we are in the process of getting a dermatology consult. 12/26/16 on evaluation today patient's left lower extremity wound shows evidence of infection with surrounding erythema noted. He has been tolerating the dressing changes but states that he has noted more discomfort. There is a larger area of erythema surrounding the wound. No fevers, chills, nausea, or vomiting noted at this time. With that being said the wound still does have slough covering the surface. He is not allergic to any medication that he is aware of at this point. In regard to his right lower extremity he had several regions that are erythematous and pruritic he wonders if there's anything we can do to help that. 01/02/17 I reviewed patient's wound culture which was obtained his visit last week. He was placed on doxycycline at that point. Unfortunately that does not appear to be an antibiotic that would likely help with the situation however the pseudomonas noted on culture is sensitive to Cipro. Also unfortunately patient's wound seems to have a large compared to last week's evaluation. Not severely so but there are definitely increased measurements in general. He is continuing to have discomfort as well he writes this to be a seven out of 10. In fact he would prefer me not to perform any debridement today due to the fact that he is having discomfort and considering he has an active infection on the little reluctant to do so anyway. No fevers, [name], [name] ([id number]) chills, nausea, or vomiting noted at this time. 01/08/17; patient seems dermatology on September 5. I suspect dermatology will want the slides from the biopsy I did sent to their pathologist. I'm not sure if there is  a way we can expedite that. In any case the culture I did before I left on vacation 3 weeks ago showed Pseudomonas he was given 10 days of Cipro and per her description of her intake nurses is actually somewhat better this week although the wound is quite a bit bigger than I remember the last time I saw this. He still has 3 more days of Cipro 01/21/17; dermatology appointment tomorrow. He has completed the ciprofloxacin for Pseudomonas. Surface of the wound looks better however he is had some deterioration in the lesions on his right leg. Meantime the left lateral leg wound we will continue with sample 01/29/17; patient had his dermatology appointment but I can't yet see that note. He is completed his antibiotics. The wound is more superficial but considerably larger in circumferential area than when he came in. This is in his left lateral calf. He also has swollen erythematous areas with superficial wounds on the right leg and small papular areas on both arms. There apparently areas in her his upper thighs and buttocks I did not look at those.  Dermatology biopsied the right leg. Hopefully will have their input next week. 02/05/17; patient went back to see his dermatologist who told him that he had a "scratching problem" as well as staph. He is now on a 30 day course of doxycycline and I believe she gave him triamcinolone cream to the right leg areas to help with the itching [not exactly sure but probably triamcinolone]. She apparently looked at the left lateral leg wound although this was not rebiopsied and I think felt to be ultimately part of the same pathogenesis. He is using sample border foam and changing nevus himself. He now has a new open area on the right posterior leg which was his biopsy site I don't have any of the dermatology notes 02/12/17; we put the patient in compression last week with SANTYL to the wound on the left leg and the biopsy. Edema is much better and the depth of the wound  is now at level of skin. Area is still the same Biopsy site on the right lateral leg we've also been using santyl with a border foam dressing and he is changing this himself. 02/19/17; Using silver alginate started last week to both the substantial left leg wound and the biopsy site on the right wound. He is tolerating compression well. Has a an appointment with his primary M.D. tomorrow wondering about diuretics although I'm wondering if the edema problem is actually lymphedema 02/26/17; the patient has been to see his primary doctor Dr. Jerrel Ivory at [place] our primary care. She started him on Lasix 20 mg and this seems to have helped with the edema. However we are not making substantial change with the left lateral calf wound and inflammation. The biopsy site on the right leg also looks stable but not really all that different. 03/12/17; the patient has been to see vein and vascular Dr. Lucky Cowboy. He has had venous reflux studies I have not reviewed these. I did get a call from his dermatology office. 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 They therefore recommended clobetasol and tetracycline i.e. 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 03/26/17; apparently the patient just had reflux studies not an appointment with Dr. dew. She arrives in clinic today having applied clobetasol for 2-3 weeks. He notes over the last 2-3 days excessive drainage having to change the dressing 3-4 times a day and also expanding erythema. He states the expanding erythema seems to come and go and was last this red was earlier in the month.he is on doxycycline 150 mg twice a day as an anti-inflammatory systemic therapy for possible  pyoderma gangrenosum along with the topical clobetasol 04/02/17; the patient was seen last week by Dr. Lillia Carmel at [facility] dermatology locally who kindly saw him at my request. 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 My concern was the degree of erythema medially extending from his left leg wound which was either inflammation from pyoderma or cellulitis. I put him on Augmentin however culture of the wound showed Pseudomonas which is quinolone sensitive. I really don't believe he has cellulitis however in view of everything I will continue and give him a course of Cipro. He is also on  doxycycline as an immune modulator for the pyoderma. In addition to his original wound on the left lateral leg with surrounding erythema he has a wound on the right posterior calf which was an original biopsy site done by dermatology. 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 Saw Dr. Lillia Carmel yesterday. He has been using topical antibiotics to both wound areas his original wound on the left and the biopsies/pathergy area on the right. There is definitely some improvement in the inflammation around the wound on the right although the patient states he has increasing sensitivity of the wounds. He is on prednisone 60 and doxycycline 1 as prescribed by Dr. Lillia Carmel. He is covering the topical antibiotic with gauze and putting this in his own compression stocks and changing this daily. He states that Dr. Lottie Rater did a culture of the left leg wound yesterday 05/07/17; pyoderma gangrenosum. The patient saw Dr. Lillia Carmel yesterday and has a follow-up with her in one month. He is still using topical antibiotics to both wounds although he can't recall exactly what type. He is still on prednisone 60 mg. Dr. Lillia Carmel stated that the doxycycline could stop if we were in agreement. 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 The right medial leg was induced by [name] ([id number]) biopsy/pathergy. The area on the right is essentially healed. 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 The right medial leg has closed and remains closed. He is still on prednisone 60. He tells me he missed his last dermatology appointment with Dr. Lillia Carmel but will make another appointment. He reports that her blood sugar at a recent screen in [place] was high 200's. He was 180 today. 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 I think he is going to require still much longer prednisone perhaps another 3 months before attempting to taper. In the meantime his wound is a lot better. Smaller. He is cleaning this off daily and applying topical antibiotics. When he was last in the clinic I thought about changing to [facility] and actually put in a couple of calls to dermatology although probably not during their business hours. 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 He continues on oral prednisone. He has been using triple antibiotic ointment. There is surface debris and we will transition to [facility] and have him return in 2 weeks. He has lost 30 pounds since his last appointment with lifestyle modification. He may benefit from topical steroid cream for treatment this can be considered at a later date. 08/22/17 on evaluation today patient appears to actually be doing rather well in regard to his left lateral lower extremity ulcer. He has actually been managed by Dr. Dellia Nims most recently. Patient is currently on oral steroids at this time. This seems to have been of benefit for him. Nonetheless  his last visit was actually with Leah on 08/06/17. Currently he is not utilizing any topical steroid creams although this could be of benefit as well. No fevers, chills, nausea, or vomiting noted at this time. 09/05/17 on evaluation today patient appears to be doing better in regard to his left lateral lower extremity ulcer. He has been tolerating  the dressing changes without complication. He is using Santyl with good effect. Overall I'm very pleased with how things are standing at this point. Patient likewise is happy that this is doing better. 09/19/17 on evaluation today patient actually appears to be doing rather well in regard to his left lateral lower extremity ulcer. 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 He's not having any significant pain. 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 He has been tolerating the Santyl without complication and in general I feel like he's making good progress. 10/17/17 on evaluation today patient appears to be doing very well in regard to his left lateral lower surety ulcer. He has been tolerating the dressing changes without complication. There does not appear to be any evidence of infection he's alternating the Santyl and the triple antibiotic ointment every other day this seems to be doing well for him. Patient History Information obtained from Patient. Family History Diabetes - Mother,Maternal Grandparents, Heart Disease - Mother,Maternal Grandparents, Hereditary Spherocytosis - Siblings, Hypertension - Maternal Grandparents, Stroke - Siblings, No family history of Cancer, Kidney Disease, Lung Disease, Seizures, Thyroid Problems, Tuberculosis. Social History Current some day smoker, Marital Status - Married, Alcohol Use - Rarely, Drug Use - No History, Caffeine Use -  Moderate. Review of Systems (ROS) Constitutional Symptoms (General Health) Denies complaints or symptoms of Fever, Chills, Marked Weight Change. Respiratory The patient has no complaints or symptoms. Cardiovascular The patient has no complaints or symptoms. Psychiatric The patient has no complaints or symptoms. [name], [name] ([id number]) Objective Constitutional Well-nourished and well-hydrated in no acute distress. Vitals Time Taken: 8:50 AM, Height: 71 in, Weight: 338 lbs, BMI: 47.1, Temperature: 98.1 F, Pulse: 90 bpm, Respiratory Rate: 16 breaths/min, Blood Pressure: 168/88 mmHg. Respiratory normal breathing without difficulty. clear to auscultation bilaterally. Cardiovascular regular rate and rhythm with normal S1, S2. Psychiatric this patient is able to make decisions and demonstrates good insight into disease process. Alert and Oriented x 3. pleasant and cooperative. General Notes: Patient's wound shows evidence of great epithelialization and seems to be filling in quite nicely. Overall I'm extremely pleased with how things are progressing at this point in time patient is also pleased. He does have a dermatology appointment on Monday. Integumentary (Hair, Skin) Wound #1 status is Open. Original cause of wound was Gradually Appeared. The wound is located on the Left,Lateral Lower Leg. The wound measures 2.2cm length x 2cm width x 0.2cm depth; 3.456cm^2 area and 0.691cm^3 volume. There is Fat Layer (Subcutaneous Tissue) Exposed exposed. There is no tunneling or undermining noted. There is a medium amount of serous drainage noted. The wound margin is distinct with the outline attached to the wound base. There is small (1-33%) red granulation within the wound bed. There is a large (67-100%) amount of necrotic tissue within the wound bed including Adherent Slough. The periwound skin appearance exhibited: Induration, Hemosiderin Staining, Erythema. The periwound skin appearance did  not exhibit: Callus, Crepitus, Excoriation, Rash, Scarring, Dry/Scaly, Maceration, Atrophie Blanche, Cyanosis, Ecchymosis, Mottled, Pallor, Rubor. The surrounding wound skin color is noted with erythema which is circumferential. Periwound temperature was noted as No Abnormality. The periwound has tenderness on palpation. 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 There was a Chemical/Enzymatic/Mechanical debridement performed by Montey Hora, RN. With the following instrument(s): tongue depressor after achieving pain control using Lidocaine 4% Topical Solution. Agent used was [employer]. A time out was conducted at 09:05, prior to the start of the procedure. There was no bleeding. The procedure was tolerated well with a pain level of 0 throughout and a pain level of 0 following the procedure. Patient s Level of Consciousness post procedure was recorded as Awake and Alert. Post Debridement Measurements: 2.2cm length x 2cm width x 0.2cm depth; 0.691cm^3 volume. Character of Wound/Ulcer Post Debridement is improved. Post procedure Diagnosis Wound #1: Same as Pre-Procedure Plan Wound Cleansing: Wound #1 Left,Lateral Lower Leg: Clean wound with Normal Saline. Anesthetic (add to Medication List): Wound #1 Left,Lateral Lower Leg: Topical Lidocaine 4% cream applied to wound bed prior to debridement (In Clinic Only). Primary Wound Dressing: Wound #1 Left,Lateral Lower Leg: Santyl Ointment Secondary Dressing: Wound #1 Left,Lateral Lower Leg: XtraSorb - secure with tape Dressing Change Frequency: Wound #1 Left,Lateral Lower Leg: Change dressing every day. Follow-up Appointments: Wound #1 Left,Lateral Lower  Leg: Return Appointment in 2 weeks. Edema Control: Wound #1 Left,Lateral Lower Leg: Patient to wear own compression stockings I'm gonna recommend currently that we continue with the Current wound care measures since he seems to be progressing so well. Patient is in agreement with this plan. We will see him for follow-up see were things stand after dermatology appointment. Please see above for specific wound care orders. We will see patient for re-evaluation in 2 week(s) here in the clinic. If anything worsens or changes patient will contact our office for additional recommendations. Electronic Signature(s) Signed: 10/17/2017 11:45:50 PM By: Worthy Keeler PA-C Entered By: Worthy Keeler on 10/17/2017 09:08:39 [name], [name] ([id number]) [name], [name] ([id number]) -------------------------------------------------------------------------------- ROS/PFSH Details Patient Name: [name] Date of Service: 10/17/2017 8:30 AM Medical Record Number: [id number] Patient Account Number: [id number] Date of Birth/Sex: 1980-07-02 (36 y.o. M) Treating RN: Montey Hora Primary Care Provider: [name] Other Clinician: Referring Provider: [name] Treating Provider/Extender: Melburn Hake, Loula Marcella Weeks in Treatment: 11 Information Obtained From Patient Wound History Do you currently have one or more open woundso Yes How many open wounds do you currently haveo 1 Approximately how long have you had your woundso 1 year How have you been treating your wound(s) until nowo neosporin Has your wound(s) ever healed and then re-openedo No Have you had any lab work done in the past montho No Have you tested positive for an antibiotic resistant organism (MRSA, VRE)o No Have you tested positive for osteomyelitis (bone infection)o No Have you had any tests for circulation on your legso No Have you had other problems associated with your woundso Infection, Swelling Constitutional Symptoms (General  Health) Complaints and Symptoms: Negative for: Fever; Chills; Marked Weight Change Eyes Medical History: Negative for: Cataracts; Glaucoma; Optic Neuritis Ear/Nose/Mouth/Throat Medical History: Negative for: Chronic sinus problems/congestion; Middle ear problems Hematologic/Lymphatic Medical History: Negative for: Anemia; Hemophilia; Human Immunodeficiency Virus; Lymphedema; Sickle Cell Disease Respiratory Complaints and Symptoms: No Complaints or Symptoms Medical History: Positive for: Sleep Apnea - cpap Negative for: Aspiration; Asthma; Chronic Obstructive Pulmonary Disease (COPD); Pneumothorax Cardiovascular Complaints and Symptoms: No Complaints or Symptoms Medical History: Positive for: Hypertension [name], [name] ([id number]) Negative for: Angina; Arrhythmia; Congestive Heart Failure; Coronary Artery Disease; Deep Vein Thrombosis; Hypotension; Myocardial Infarction; Peripheral Arterial Disease; Peripheral Venous Disease; Phlebitis; Vasculitis  Gastrointestinal Medical History: Positive for: Colitis Endocrine Medical History: Negative for: Type I Diabetes; Type II Diabetes Genitourinary Medical History: Negative for: End Stage Renal Disease Immunological Medical History: Negative for: Lupus Erythematosus; Raynaudos; Scleroderma Integumentary (Skin) Medical History: Negative for: History of Burn; History of pressure wounds Musculoskeletal Medical History: Negative for: Gout; Rheumatoid Arthritis; Osteoarthritis; Osteomyelitis Neurologic Medical History: Negative for: Dementia; Neuropathy; Quadriplegia; Paraplegia; Seizure Disorder Oncologic Medical History: Negative for: Received Chemotherapy; Received Radiation Psychiatric Complaints and Symptoms: No Complaints or Symptoms Medical History: Negative for: Anorexia/bulimia; Confinement Anxiety Immunizations Pneumococcal Vaccine: Received Pneumococcal Vaccination: No Implantable Devices Family and Social  History [name], [name] ([id number]) Cancer: No; Diabetes: Yes - Mother,Maternal Grandparents; Heart Disease: Yes - Mother,Maternal Grandparents; Hereditary Spherocytosis: Yes - Siblings; Hypertension: Yes - Maternal Grandparents; Kidney Disease: No; Lung Disease: No; Seizures: No; Stroke: Yes - Siblings; Thyroid Problems: No; Tuberculosis: No; Current some day smoker; Marital Status - Married; Alcohol Use: Rarely; Drug Use: No History; Caffeine Use: Moderate; Financial Concerns: No; Food, Clothing or Shelter Needs: No; Support System Lacking: No; Transportation Concerns: No; Advanced Directives: No; Patient does not want information on Advanced Directives; Living Will: No Physician Affirmation I have reviewed and agree with the above information. Electronic Signature(s) Signed: 10/17/2017 5:45:37 PM By: Montey Hora Signed: 10/17/2017 11:45:50 PM By: Worthy Keeler PA-C Entered By: Worthy Keeler on 10/17/2017 09:07:59 [name], [name] ([id number]) -------------------------------------------------------------------------------- SuperBill Details Patient Name: [name] Date of Service: 10/17/2017 Medical Record Number: [id number] Patient Account Number: [id number] Date of Birth/Sex: 02/02/1981 (36 y.o. M) Treating RN: Montey Hora Primary Care Provider: [name] Other Clinician: Referring Provider: [name] Treating Provider/Extender: Melburn Hake, Micaella Gitto Weeks in Treatment: 45 Diagnosis Coding ICD-10 Codes Code Description [phone] 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sis of muscle L88 Pyoderma gangrenosum I87.2 Venous insufficiency (chronic) (peripheral) Facility Procedures CPT4 Code: [id number] Description: 62263 - DEBRIDE W/O ANES NON SELECT Modifier: Quantity: 1 Physician Procedures CPT4 Code: [id number] Description: 56389 - WC PHYS LEVEL 3 - EST PT ICD-10 Diagnosis Description L97.223 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sis of m L88 Pyoderma  gangrenosum I87.2 Venous insufficiency (chronic) (peripheral) Modifier: uscle Quantity: 1 Electronic Signature(s) Signed: 10/17/2017 11:45:50 PM By: Worthy Keeler PA-C Entered By: Worthy Keeler on 10/17/2017 09:08:56

## 2017-10-20 ENCOUNTER — Ambulatory Visit
Admit: 2017-10-20 | Discharge: 2017-10-21 | Payer: PRIVATE HEALTH INSURANCE | Attending: Dermatology | Primary: Dermatology

## 2017-10-20 DIAGNOSIS — B079 Viral wart, unspecified: Secondary | ICD-10-CM

## 2017-10-20 DIAGNOSIS — L88 Pyoderma gangrenosum: Principal | ICD-10-CM

## 2017-10-20 DIAGNOSIS — L918 Other hypertrophic disorders of the skin: Secondary | ICD-10-CM

## 2017-10-20 DIAGNOSIS — B078 Other viral warts: Secondary | ICD-10-CM | POA: Diagnosis not present

## 2017-10-20 MED ORDER — CLOBETASOL 0.05 % TOPICAL OINTMENT
1 refills | 0 days | Status: CP
Start: 2017-10-20 — End: 2018-09-24

## 2017-10-29 ENCOUNTER — Ambulatory Visit (INDEPENDENT_AMBULATORY_CARE_PROVIDER_SITE_OTHER): Payer: 59 | Admitting: Primary Care

## 2017-10-29 ENCOUNTER — Encounter: Payer: Self-pay | Admitting: Primary Care

## 2017-10-29 VITALS — BP 122/82 | HR 100 | Temp 98.0°F | Ht 72.0 in | Wt 353.5 lb

## 2017-10-29 DIAGNOSIS — E785 Hyperlipidemia, unspecified: Secondary | ICD-10-CM

## 2017-10-29 DIAGNOSIS — F3342 Major depressive disorder, recurrent, in full remission: Secondary | ICD-10-CM

## 2017-10-29 DIAGNOSIS — E119 Type 2 diabetes mellitus without complications: Secondary | ICD-10-CM

## 2017-10-29 DIAGNOSIS — E11622 Type 2 diabetes mellitus with other skin ulcer: Secondary | ICD-10-CM | POA: Diagnosis not present

## 2017-10-29 DIAGNOSIS — L97222 Non-pressure chronic ulcer of left calf with fat layer exposed: Secondary | ICD-10-CM | POA: Diagnosis not present

## 2017-10-29 LAB — LIPID PANEL
Cholesterol: 168 mg/dL (ref 0–200)
HDL: 57.7 mg/dL (ref >39.00)
LDL Cholesterol: 82 mg/dL (ref 0–99)
NonHDL: 110.4
Total CHOL/HDL Ratio: 3
Triglycerides: 140 mg/dL (ref 0.0–149.0)
VLDL: 28 mg/dL (ref 0.0–40.0)

## 2017-10-29 LAB — COMPREHENSIVE METABOLIC PANEL
ALT: 25 U/L (ref 0–53)
AST: 13 U/L (ref 0–37)
Albumin: 4.1 g/dL (ref 3.5–5.2)
Alkaline Phosphatase: 89 U/L (ref 39–117)
BUN: 12 mg/dL (ref 6–23)
CO2: 28 mEq/L (ref 19–32)
Calcium: 9.3 mg/dL (ref 8.4–10.5)
Chloride: 99 mEq/L (ref 96–112)
Creatinine, Ser: 1.05 mg/dL (ref 0.40–1.50)
GFR: 83.68 mL/min (ref >60.00)
Glucose, Bld: 163 mg/dL — ABNORMAL HIGH (ref 70–99)
Potassium: 3.8 mEq/L (ref 3.5–5.1)
Sodium: 137 mEq/L (ref 135–145)
Total Bilirubin: 0.4 mg/dL (ref 0.2–1.2)
Total Protein: 6.7 g/dL (ref 6.0–8.3)

## 2017-10-29 LAB — POCT GLYCOSYLATED HEMOGLOBIN (HGB A1C): Hemoglobin A1C: 7.8 % — AB (ref 4.0–5.6)

## 2017-10-29 MED ORDER — METFORMIN HCL 1000 MG PO TABS: 1000.0000 mg | ORAL_TABLET | Freq: Two times a day (BID) | ORAL | 3 refills | Status: DC

## 2017-10-29 NOTE — Assessment & Plan Note (Signed)
A1C slightly improved from 8.2 to 7.8. Will increase Metformin to 1000 mg BID. Also encouraged weight loss through diet and exercise.  Urine microalbumin negative in February 2019. Lipid panel pending, last LDL at goal. No statin therapy. Pneumovax due, will administer next visit. Eye exam due in September 2019.  Follow up in 6 months.

## 2017-10-29 NOTE — Patient Instructions (Signed)
Stop by the lab prior to leaving today. I will notify you of your results once received.   We've increased your metformin to 1000 mg twice daily.   Start exercising. You should be getting 150 minutes of moderate intensity exercise weekly.  Continue to work on [employer] as discussed. Increase vegetables, fruit, whole grains, lean protein.   Purchase a knee sleeve to help stabilize your knee. Try the exercises below.  Start Tylenol and take 650 mg three times daily for knee pain. Do not exceed 3000 mg in 24 hours. Try this regimen for 5 days.  Please schedule a follow up appointment in 6 months.   It was a pleasure to see you today!   Knee Exercises Ask your health care provider which exercises are safe for you. Do exercises exactly as told by your health care provider and adjust them as directed. It is normal to feel mild stretching, pulling, tightness, or discomfort as you do these exercises, but you should stop right away if you feel sudden pain or your pain gets worse.Do not begin these exercises until told by your health care provider. STRETCHING AND RANGE OF MOTION EXERCISES These exercises warm up your muscles and joints and improve the movement and flexibility of your knee. These exercises also help to relieve pain, numbness, and tingling. Exercise A: Knee Extension, Prone 1. Lie on your abdomen on a bed. 2. Place your left / right knee just beyond the edge of the surface so your knee is not on the bed. You can put a towel under your left / right thigh just above your knee for comfort. 3. Relax your leg muscles and allow gravity to straighten your knee. You should feel a stretch behind your left / right knee. 4. Hold this position for __________ seconds. 5. Scoot up so your knee is supported between repetitions. Repeat __________ times. Complete this stretch __________ times a day. Exercise B: Knee Flexion, Active  1. Lie on your back with both knees straight. If this causes back  discomfort, bend your left / right knee so your foot is flat on the floor. 2. Slowly slide your left / right heel back toward your buttocks until you feel a gentle stretch in the front of your knee or thigh. 3. Hold this position for __________ seconds. 4. Slowly slide your left / right heel back to the starting position. Repeat __________ times. Complete this exercise __________ times a day. Exercise C: Quadriceps, Prone  1. Lie on your abdomen on a firm surface, such as a bed or padded floor. 2. Bend your left / right knee and hold your ankle. If you cannot reach your ankle or pant leg, loop a belt around your foot and grab the belt instead. 3. Gently pull your heel toward your buttocks. Your knee should not slide out to the side. You should feel a stretch in the front of your thigh and knee. 4. Hold this position for __________ seconds. Repeat __________ times. Complete this stretch __________ times a day. Exercise D: Hamstring, Supine 1. Lie on your back. 2. Loop a belt or towel over the ball of your left / right foot. The ball of your foot is on the walking surface, right under your toes. 3. Straighten your left / right knee and slowly pull on the belt to raise your leg until you feel a gentle stretch behind your knee. ? Do not let your left / right knee bend while you do this. ? Keep your  other leg flat on the floor. 4. Hold this position for __________ seconds. Repeat __________ times. Complete this stretch __________ times a day. STRENGTHENING EXERCISES These exercises build strength and endurance in your knee. Endurance is the ability to use your muscles for a long time, even after they get tired. Exercise E: Quadriceps, Isometric  1. Lie on your back with your left / right leg extended and your other knee bent. Put a rolled towel or small pillow under your knee if told by your health care provider. 2. Slowly tense the muscles in the front of your left / right thigh. You should see  your kneecap slide up toward your hip or see increased dimpling just above the knee. This motion will push the back of the knee toward the floor. 3. For __________ seconds, keep the muscle as tight as you can without increasing your pain. 4. Relax the muscles slowly and completely. Repeat __________ times. Complete this exercise __________ times a day. Exercise F: Straight Leg Raises - Quadriceps 1. Lie on your back with your left / right leg extended and your other knee bent. 2. Tense the muscles in the front of your left / right thigh. You should see your kneecap slide up or see increased dimpling just above the knee. Your thigh may even shake a bit. 3. Keep these muscles tight as you raise your leg 4-6 inches (10-15 cm) off the floor. Do not let your knee bend. 4. Hold this position for __________ seconds. 5. Keep these muscles tense as you lower your leg. 6. Relax your muscles slowly and completely after each repetition. Repeat __________ times. Complete this exercise __________ times a day. Exercise G: Hamstring, Isometric 1. Lie on your back on a firm surface. 2. Bend your left / right knee approximately __________ degrees. 3. Dig your left / right heel into the surface as if you are trying to pull it toward your buttocks. Tighten the muscles in the back of your thighs to dig as hard as you can without increasing any pain. 4. Hold this position for __________ seconds. 5. Release the tension gradually and allow your muscles to relax completely for __________ seconds after each repetition. Repeat __________ times. Complete this exercise __________ times a day. Exercise H: Hamstring Curls  If told by your health care provider, do this exercise while wearing ankle weights. Begin with __________ weights. Then increase the weight by 1 lb (0.5 kg) increments. Do not wear ankle weights that are more than __________. 1. Lie on your abdomen with your legs straight. 2. Bend your left / right knee  as far as you can without feeling pain. Keep your hips flat against the floor. 3. Hold this position for __________ seconds. 4. Slowly lower your leg to the starting position.  Repeat __________ times. Complete this exercise __________ times a day. Exercise I: Squats (Quadriceps) 1. Stand in front of a table, with your feet and knees pointing straight ahead. You may rest your hands on the table for balance but not for support. 2. Slowly bend your knees and lower your hips like you are going to sit in a chair. ? Keep your weight over your heels, not over your toes. ? Keep your lower legs upright so they are parallel with the table legs. ? Do not let your hips go lower than your knees. ? Do not bend lower than told by your health care provider. ? If your knee pain increases, do not bend as low. 3. Hold  the squat position for __________ seconds. 4. Slowly push with your legs to return to standing. Do not use your hands to pull yourself to standing. Repeat __________ times. Complete this exercise __________ times a day. Exercise J: Wall Slides (Quadriceps)  1. Lean your back against a smooth wall or door while you walk your feet out 18-24 inches (46-61 cm) from it. 2. Place your feet hip-width apart. 3. Slowly slide down the wall or door until your knees bend __________ degrees. Keep your knees over your heels, not over your toes. Keep your knees in line with your hips. 4. Hold for __________ seconds. Repeat __________ times. Complete this exercise __________ times a day. Exercise K: Straight Leg Raises - Hip Abductors 1. Lie on your side with your left / right leg in the top position. Lie so your head, shoulder, knee, and hip line up. You may bend your bottom knee to help you keep your balance. 2. Roll your hips slightly forward so your hips are stacked directly over each other and your left / right knee is facing forward. 3. Leading with your heel, lift your top leg 4-6 inches (10-15 cm). You  should feel the muscles in your outer hip lifting. ? Do not let your foot drift forward. ? Do not let your knee roll toward the ceiling. 4. Hold this position for __________ seconds. 5. Slowly return your leg to the starting position. 6. Let your muscles relax completely after each repetition. Repeat __________ times. Complete this exercise __________ times a day. Exercise L: Straight Leg Raises - Hip Extensors 1. Lie on your abdomen on a firm surface. You can put a pillow under your hips if that is more comfortable. 2. Tense the muscles in your buttocks and lift your left / right leg about 4-6 inches (10-15 cm). Keep your knee straight as you lift your leg. 3. Hold this position for __________ seconds. 4. Slowly lower your leg to the starting position. 5. Let your leg relax completely after each repetition. Repeat __________ times. Complete this exercise __________ times a day. This information is not intended to replace advice given to you by your health care provider. Make sure you discuss any questions you have with your health care provider. Document Released: 03/20/2005 Document Revised: 01/29/2016 Document Reviewed: 03/12/2015 Elsevier Interactive Patient Education  2018 [employer]

## 2017-10-29 NOTE — Assessment & Plan Note (Signed)
Doing well on Effexor, continue same. Denies SI/HI.  

## 2017-10-29 NOTE — Progress Notes (Signed)
Subjective:    Patient ID: [name], male    DOB: Mar 28, 1981, 37 y.o.   MRN: [id number]  HPI  [name] is a 37 year old male who presents today for follow up.  1) Type 2 Diabetes:  Current medications include: Metformin 500 mg BID.  He does not check his blood sugars.   Last A1C: 8.2 in February 2019, 7.8 today Last Eye Exam: Due in September 2018 Last Foot Exam: Due today Pneumonia Vaccination: Never completed, due today ACE/ARB: Urine microalbumin negative in February 2019 Statin: No statin, lipid panel due today  Diet currently consists of:  Breakfast: Eggs, sausage  Lunch: Grilled chicken, chili Dinner: Pasta, fish, chicken, french fries, vegetables, salads Snacks: Rarely  Desserts: None Beverages: Water, occasional vitamin drink   Exercise: He is not currently exercising   2) Skin Ulcer: Located to left calf. Currently following with wound clinic and is tapering off of prednisone slowly and is down to 50 mg daily.  3) Depression: Currently managed on venlafaxine XR 150 mg. He feels well managed on this regimen. Denies SI/HI.  4) Acute Knee Pain: Began three weeks ago and is located to lateral and posterior patella. He describes his pain as pressure and ache, occasional sharp/shooting. he has noticed intermittent swelling. He denies trauma, numbness/tingling, increased activity. He does walk a lot for work. He's been taking Ibuprofen once weekly for the last three weeks.    Review of Systems  Respiratory: Negative for shortness of breath.   Cardiovascular: Negative for chest pain.  Musculoskeletal: Positive for arthralgias.  Skin:       Healing ulcer, following with wound clinic  Neurological: Negative for dizziness and numbness.       Past Medical History:  Diagnosis Date  . Blood in stool   . Depression   . Elevated blood pressure   . Hyperlipidemia   . OSA (obstructive sleep apnea) 2014  . Seasonal allergies      Social History   Socioeconomic  History  . Marital status: Married    Spouse name: Not on file  . Number of children: Not on file  . Years of education: Not on file  . Highest education level: Not on file  Occupational History  . Not on file  Social Needs  . Financial resource strain: Not on file  . Food insecurity:    Worry: Not on file    Inability: Not on file  . Transportation needs:    Medical: Not on file    Non-medical: Not on file  Tobacco Use  . Smoking status: Former Games developer  . Smokeless tobacco: Former Neurosurgeon    Quit date: 03/17/2015  . Tobacco comment: Quit in [place] smoker for 9 years  Substance and Sexual Activity  . Alcohol use: Yes    Alcohol/week: 0.0 oz    Comment: soical  . Drug use: Not on file  . Sexual activity: Not on file  Lifestyle  . Physical activity:    Days per week: Not on file    Minutes per session: Not on file  . Stress: Not on file  Relationships  . Social connections:    Talks on phone: Not on file    Gets together: Not on file    Attends religious service: Not on file    Active member of club or organization: Not on file    Attends meetings of clubs or organizations: Not on file    Relationship status: Not on file  .  Intimate partner violence:    Fear of current or ex partner: Not on file    Emotionally abused: Not on file    Physically abused: Not on file    Forced sexual activity: Not on file  Other Topics Concern  . Not on file  Social History Narrative   Married.   7 kids.   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   Enjoys target shooting.     Past Surgical History:  Procedure Laterality Date  . SEPTOPLASTY  2007  . WRIST SURGERY      Family History  Problem Relation Age of Onset  . Alcohol abuse Paternal Aunt   . Alcohol abuse Paternal Uncle   . Stroke Paternal Uncle   . Hyperlipidemia Maternal Grandfather   . Hypertension Maternal Grandfather   . Diabetes Maternal Grandfather   . Alcohol abuse Maternal Grandfather   . Multiple sclerosis  Mother   . Dementia Mother     Allergies  Allergen Reactions  . Biaxin [Clarithromycin] Other (See Comments)    Causes colitis flares  . Milk-Related Compounds Hives    Current Outpatient Medications on File Prior to Visit  Medication Sig Dispense Refill  . cetirizine (ZYRTEC) 10 MG tablet Take 1 tablet (10 mg total) by mouth daily. 90 tablet 1  . clobetasol ointment (TEMOVATE) 0.05 % Apply to wound once daily    . predniSONE (DELTASONE) 20 MG tablet Take 2 and 1/2  tabs (50 mg total) daily.    . ranitidine (ZANTAC) 150 MG tablet Take 150 mg by mouth 2 (two) times daily as needed for heartburn (acid reflux).     . venlafaxine XR (EFFEXOR-XR) 150 MG 24 hr capsule TAKE 1 CAPSULE BY MOUTH  DAILY WITH BREAKFAST 90 capsule 1  . acetaminophen (TYLENOL) 500 MG tablet Take 1,000 mg by mouth every 6 (six) hours as needed for headache (pain).    Marland Kitchen. ibuprofen (ADVIL,MOTRIN) 200 MG tablet Take 400-600 mg by mouth every 6 (six) hours as needed for headache (pain).     No current facility-administered medications on file prior to visit.     BP 122/82   Pulse 100   Temp 98 F (36.7 C) (Oral)   Ht 6' (1.829 m)   Wt (!) 353 lb 8 oz (160.3 kg)   SpO2 96%   BMI 47.94 kg/m    Objective:   Physical Exam  Constitutional: He appears well-nourished.  Cardiovascular: Normal rate and regular rhythm.  Respiratory: Effort normal and breath sounds normal.  Musculoskeletal:       Left knee: He exhibits normal range of motion and no swelling. No tenderness found.       Legs: No obvious swelling today, non tender.  No color change.   Skin: Skin is warm and dry.  Psychiatric: He has a normal mood and affect.           Assessment & Plan:  Acute knee pain:  Located to left knee x 3 weeks, no trauma. Exam today overall unremarkable.  Suspect bursitis from inflammation.  Recommended weight loss, knee sleeve for support, Tylenol (already on prednisone), ice, elevation, home stretching. He will  update if no improvement.  Doreene NestKatherine K Samary Shatz, NP

## 2017-10-29 NOTE — Assessment & Plan Note (Signed)
Following with wound center, continued improvement. Continue weaning off prednisone as directed.

## 2017-10-31 ENCOUNTER — Ambulatory Visit: Payer: 59 | Admitting: Physician Assistant

## 2017-11-03 ENCOUNTER — Encounter: Payer: 59 | Attending: Physician Assistant | Admitting: Physician Assistant

## 2017-11-03 DIAGNOSIS — L88 Pyoderma gangrenosum: Secondary | ICD-10-CM | POA: Diagnosis not present

## 2017-11-03 DIAGNOSIS — L97213 Non-pressure chronic ulcer of right calf with necrosis of muscle: Secondary | ICD-10-CM | POA: Insufficient documentation

## 2017-11-03 DIAGNOSIS — L97223 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sis of muscle: Secondary | ICD-10-CM | POA: Diagnosis not present

## 2017-11-03 DIAGNOSIS — I872 Venous insufficiency (chronic) (peripheral): Secondary | ICD-10-CM | POA: Diagnosis not present

## 2017-11-03 DIAGNOSIS — Z823 Family history of stroke: Secondary | ICD-10-CM | POA: Diagnosis not present

## 2017-11-03 DIAGNOSIS — I10 Essential (primary) hypertension: Secondary | ICD-10-CM | POA: Insufficient documentation

## 2017-11-03 DIAGNOSIS — Z91011 Allergy to milk products: Secondary | ICD-10-CM | POA: Diagnosis not present

## 2017-11-03 DIAGNOSIS — K5190 Ulcerative colitis, unspecified, without complications: Secondary | ICD-10-CM | POA: Insufficient documentation

## 2017-11-03 DIAGNOSIS — L97822 Non-pressure chronic ulcer of other part of left lower leg with fat layer exposed: Secondary | ICD-10-CM | POA: Diagnosis not present

## 2017-11-05 NOTE — Progress Notes (Signed)
KEVANTE, LUNT ([id number]) Visit Report for 11/03/2017 Arrival Information Details Patient Name: LENOX, BINK Date of Service: 11/03/2017 8:45 AM Medical Record Number: [id number] Patient Account Number: [id number] Date of Birth/Sex: 08/13/1980 (37 y.o. M) Treating RN: Montey Hora Primary Care Rendon Howell: [name] Other Clinician: Referring Ramonda Galyon: [name] Treating Caidin Heidenreich/Extender: Melburn Hake, [name]: 25 Visit Information History Since Last Visit Added or deleted any medications: No Patient Arrived: Ambulatory Any new allergies or adverse reactions: No Arrival Time: 08:48 Had a fall or experienced change in No Accompanied By: self activities of daily living that may affect Transfer Assistance: None risk of falls: Patient Identification Verified: Yes Signs or symptoms of abuse/neglect since last visito No Secondary Verification Process Completed: Yes Hospitalized since last visit: No Patient Requires Transmission-Based No Implantable device outside of the clinic excluding No Precautions: cellular tissue based products placed in the center Patient Has Alerts: No since last visit: Has Dressing in Place as Prescribed: Yes Pain Present Now: No Electronic Signature(s) Signed: 11/03/2017 5:27:17 PM By: Montey Hora Entered By: Montey Hora on 11/03/2017 08:48:50 [name] ([id number]) -------------------------------------------------------------------------------- Clinic Level of Care Assessment Details Patient Name: [name] Date of Service: 11/03/2017 8:45 AM Medical Record Number: [id number] Patient Account Number: [id number] Date of Birth/Sex: [birth year] (36 y.o. M) Treating RN: Roger Shelter Primary Care Loralai Eisman: [name] Other Clinician: Referring Zamorah Ailes: [name] Treating Eunice Winecoff/Extender: Melburn Hake, [name]: 31 Clinic Level of Care Assessment Items TOOL 4 Quantity Score X - Use when only  an EandM is performed on FOLLOW-UP visit 1 0 ASSESSMENTS - Nursing Assessment / Reassessment X - Reassessment of Co-morbidities (includes updates in patient status) 1 10 X- 1 5 Reassessment of Adherence to Treatment Plan ASSESSMENTS - Wound and Skin Assessment / Reassessment X - Simple Wound Assessment / Reassessment - one wound 1 5 []  - 0 Complex Wound Assessment / Reassessment - multiple wounds []  - 0 Dermatologic / Skin Assessment (not related to wound area) ASSESSMENTS - Focused Assessment []  - Circumferential Edema Measurements - multi extremities 0 []  - 0 Nutritional Assessment / Counseling / Intervention []  - 0 Lower Extremity Assessment (monofilament, tuning fork, pulses) []  - 0 Peripheral Arterial Disease Assessment (using hand held doppler) ASSESSMENTS - Ostomy and/or Continence Assessment and Care []  - Incontinence Assessment and Management 0 []  - 0 Ostomy Care Assessment and Management (repouching, etc.) PROCESS - Coordination of Care X - Simple Patient / Family Education for ongoing care 1 15 []  - 0 Complex (extensive) Patient / Family Education for ongoing care []  - 0 Staff obtains Programmer, systems, Records, Test Results / Process Orders []  - 0 Staff telephones HHA, Nursing Homes / Clarify orders / etc []  - 0 Routine Transfer to another Facility (non-emergent condition) []  - 0 Routine Hospital Admission (non-emergent condition) []  - 0 New Admissions / Biomedical engineer / Ordering NPWT, Apligraf, etc. []  - 0 Emergency Hospital Admission (emergent condition) X- 1 10 Simple Discharge Coordination Hudock, Reshad ([id number]) []  - 0 Complex (extensive) Discharge Coordination PROCESS - Special Needs []  - Pediatric / Minor Patient Management 0 []  - 0 Isolation Patient Management []  - 0 Hearing / Language / Visual special needs []  - 0 Assessment of Community assistance (transportation, D/C planning, etc.) []  - 0 Additional assistance / Altered mentation []  -  0 Support Surface(s) Assessment (bed, cushion, seat, etc.) INTERVENTIONS - Wound Cleansing / Measurement X - Simple Wound Cleansing - one wound 1 5 []  - 0 Complex Wound Cleansing - multiple wounds  X- 1 5 Wound Imaging (photographs - any number of wounds) []  - 0 Wound Tracing (instead of photographs) X- 1 5 Simple Wound Measurement - one wound []  - 0 Complex Wound Measurement - multiple wounds INTERVENTIONS - Wound Dressings X - Small Wound Dressing one or multiple wounds 1 10 []  - 0 Medium Wound Dressing one or multiple wounds []  - 0 Large Wound Dressing one or multiple wounds []  - 0 Application of Medications - topical []  - 0 Application of Medications - injection INTERVENTIONS - Miscellaneous []  - External ear exam 0 []  - 0 Specimen Collection (cultures, biopsies, blood, body fluids, etc.) []  - 0 Specimen(s) / Culture(s) sent or taken to Lab for analysis []  - 0 Patient Transfer (multiple staff / Civil Service fast streamer / Similar devices) []  - 0 Simple Staple / Suture removal (25 or less) []  - 0 Complex Staple / Suture removal (26 or more) []  - 0 Hypo / Hyperglycemic Management (close monitor of Blood Glucose) []  - 0 Ankle / Brachial Index (ABI) - do not check if billed separately X- 1 5 Vital Signs Solberg, Elvis ([id number]) Has the patient been seen at the hospital within the last three years: Yes Total Score: 75 Level Of Care: New/Established - Level 2 Electronic Signature(s) Signed: 11/03/2017 5:06:21 PM By: Roger Shelter Entered By: Roger Shelter on 11/03/2017 09:19:15 [name] ([id number]) -------------------------------------------------------------------------------- Encounter Discharge Information Details Patient Name: [name] Date of Service: 11/03/2017 8:45 AM Medical Record Number: [id number] Patient Account Number: [id number] Date of Birth/Sex: 07/11/1980 (36 y.o. M) Treating RN: Roger Shelter Primary Care Stephfon Bovey: [name] Other  Clinician: Referring Orlan Aversa: [name] Treating Clearance Chenault/Extender: Melburn Hake, [name]: 6 Encounter Discharge Information Items Discharge Condition: Stable Ambulatory Status: Ambulatory Discharge Destination: Home Transportation: Private Auto Schedule Follow-up Appointment: Yes Clinical Summary of Care: Electronic Signature(s) Signed: 11/03/2017 5:06:21 PM By: Roger Shelter Entered By: Roger Shelter on 11/03/2017 09:22:58 [name] ([id number]) -------------------------------------------------------------------------------- Lower Extremity Assessment Details Patient Name: [name] Date of Service: 11/03/2017 8:45 AM Medical Record Number: [id number] Patient Account Number: [id number] Date of Birth/Sex: Dec 23, 1980 (36 y.o. M) Treating RN: Montey Hora Primary Care Lianette Broussard: [name] Other Clinician: Referring Sharyl Panchal: [name] Treating Anum Palecek/Extender: Melburn Hake, [name]: 34 Vascular Assessment Pulses: Dorsalis Pedis Palpable: [Left:Yes] Posterior Tibial Extremity colors, hair growth, and conditions: Extremity Color: [Left:Hyperpigmented] Hair Growth on Extremity: [Left:Yes] Temperature of Extremity: [Left:Warm] Capillary Refill: [Left:< 3 seconds] Toe Nail Assessment Left: Right: Thick: Yes Discolored: No Deformed: Yes Improper Length and Hygiene: Yes Electronic Signature(s) Signed: 11/03/2017 5:27:17 PM By: Montey Hora Entered By: Montey Hora on 11/03/2017 08:54:15 [name] ([id number]) -------------------------------------------------------------------------------- Multi Wound Chart Details Patient Name: [name] Date of Service: 11/03/2017 8:45 AM Medical Record Number: [id number] Patient Account Number: [id number] Date of Birth/Sex: [birth year] (36 y.o. 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] [N/A:N/A] Comorbid History: [1:Sleep Apnea, Hypertension, Colitis] [N/A:N/A] Date Acquired: [1:11/18/2015] [N/A:N/A] Weeks of Treatment: [1:47] [N/A:N/A] Wound Status: [1:Open] [N/A:N/A] Measurements L x W x D [1:0.6x1.2x0.1] [N/A:N/A] (cm) Area (cm) : [1:0.565] [N/A:N/A] Volume (cm) : [1:0.057] [N/A:N/A] % Reduction in Area: [1:88.50%] [N/A:N/A] % Reduction in Volume: [1:98.50%] [  N/A:N/A] Classification: [1:Full Thickness Without Exposed Support Structures] [N/A:N/A] Exudate Amount: [1:Medium] [N/A:N/A] Exudate Type: [1:Serous] [N/A:N/A] Exudate Color: [1:amber] [N/A:N/A] Wound Margin: [1:Distinct, outline attached] [N/A:N/A] Granulation Amount: [1:None Present (0%)] [N/A:N/A] Necrotic Amount: [1:Large (67-100%)] [N/A:N/A] Exposed Structures: [1:Fat Layer (Subcutaneous Tissue) Exposed: Yes Fascia: No Tendon: No Muscle: No Joint: No Bone: No] [N/A:N/A] Epithelialization: [1:Medium (34-66%)] [N/A:N/A] Periwound Skin Texture: [1:Induration: Yes Excoriation: No Callus: No Crepitus: No Rash: No Scarring: No] [N/A:N/A] Periwound Skin Moisture: Maceration: No N/A N/A Dry/Scaly: No Periwound Skin Color: Erythema: Yes N/A N/A Hemosiderin Staining: Yes Atrophie Blanche: No Cyanosis: No Ecchymosis: No Mottled: No Pallor: No Rubor: No Erythema Location: Circumferential N/A N/A Erythema Change: Increased N/A N/A Temperature: No Abnormality N/A N/A Tenderness on Palpation: Yes N/A N/A Wound Preparation: Ulcer Cleansing: Wound N/A N/A Cleanser Topical Anesthetic  Applied: Other: Lidocaine 4% Treatment Notes Electronic Signature(s) Signed: 11/03/2017 5:06:21 PM By: Roger Shelter Entered By: Roger Shelter on 11/03/2017 09:17:26 LATHAN, GIESELMAN ([id number]) -------------------------------------------------------------------------------- Multi-Disciplinary Care Plan Details Patient Name: [name] Date of Service: 11/03/2017 8:45 AM Medical Record Number: [id number] Patient Account Number: [id number] Date of Birth/Sex: [birth year] (36 y.o. M) Treating RN: Roger Shelter Primary Care Carlicia Leavens: [name] Other Clinician: Referring Isao Seltzer: [name] Treating Carmelite Violet/Extender: Melburn Hake, [name]: 16 Active Inactive ` Orientation to the Wound Care Program Nursing Diagnoses: Knowledge deficit related to the wound healing center program Goals: Patient/caregiver will verbalize understanding of the [facility] Date Initiated: 12/11/2016 Target Resolution Date: 03/13/2017 Goal Status: Active Interventions: Provide education on orientation to the wound center Notes: ` Venous Leg Ulcer Nursing Diagnoses: Knowledge deficit related to disease process and management Potential for venous Insuffiency (use before diagnosis confirmed) Goals: Non-invasive venous studies are completed as ordered Date Initiated: 12/11/2016 Target Resolution Date: 03/13/2017 Goal Status: Active Patient will maintain optimal edema control Date Initiated: 12/11/2016 Target Resolution Date: 03/13/2017 Goal Status: Active Patient/caregiver will verbalize understanding of disease process and disease management Date Initiated: 12/11/2016 Target Resolution Date: 03/13/2017 Goal Status: Active Verify adequate tissue perfusion prior to therapeutic compression application Date Initiated: 12/11/2016 Target Resolution Date: 03/13/2017 Goal Status: Active Interventions: Assess peripheral edema status every visit. Compression as  ordered Provide education on venous insufficiency Treatment Activities: RAYQUON, USELMAN ([id number]) Therapeutic compression applied : 12/11/2016 Notes: ` Wound/Skin Impairment Nursing Diagnoses: Impaired tissue integrity Knowledge deficit related to ulceration/compromised skin integrity Goals: Patient/caregiver will verbalize understanding of skin care regimen Date Initiated: 12/11/2016 Target Resolution Date: 03/13/2017 Goal Status: Active Ulcer/skin breakdown will have a volume reduction of 30% by week 4 Date Initiated: 12/11/2016 Target Resolution Date: 03/13/2017 Goal Status: Active Ulcer/skin breakdown will have a volume reduction of 50% by week 8 Date Initiated: 12/11/2016 Target Resolution Date: 03/13/2017 Goal Status: Active Ulcer/skin breakdown will have a volume reduction of 80% by week 12 Date Initiated: 12/11/2016 Target Resolution Date: 03/13/2017 Goal Status: Active Ulcer/skin breakdown will heal within 14 weeks Date Initiated: 12/11/2016 Target Resolution Date: 03/13/2017 Goal Status: Active Interventions: Assess patient/caregiver ability to obtain necessary supplies Assess patient/caregiver ability to perform ulcer/skin care regimen upon admission and as needed Assess ulceration(s) every visit Provide education on ulcer and skin care Treatment Activities: Skin care regimen initiated : 12/11/2016 Topical wound management initiated : 12/11/2016 Notes: Electronic Signature(s) Signed: 11/03/2017 5:06:21 PM By: Roger Shelter Entered By: Roger Shelter on 11/03/2017 09:17:17 [name] ([id number]) -------------------------------------------------------------------------------- Pain Assessment Details Patient Name: [name] Date of Service: 11/03/2017 8:45 AM Medical Record Number: [id number] Patient Account Number: [id number] Date of  Birth/Sex: 1980-11-19 (37 y.o. M) Treating RN: Montey Hora Primary Care Alfonse Garringer: [name] Other  Clinician: Referring Lucan Riner: [name] Treating Maycee Blasco/Extender: Melburn Hake, [name]: 3 Active Problems Location of Pain Severity and Description of Pain Patient Has Paino No Site Locations Pain Management and Medication Current Pain Management: Electronic Signature(s) Signed: 11/03/2017 5:27:17 PM By: Montey Hora Entered By: Montey Hora on 11/03/2017 08:50:38 [name] ([id number]) -------------------------------------------------------------------------------- Patient/Caregiver Education Details Patient Name: [name] Date of Service: 11/03/2017 8:45 AM Medical Record Number: [id number] Patient Account Number: [id number] Date of Birth/Gender: 1981-03-05 (36 y.o. M) Treating RN: Roger Shelter Primary Care Physician: [name] Other Clinician: Referring Physician: [name] Treating Physician/Extender: Sharalyn Ink in Treatment: 47 Education Assessment Education Provided To: Patient Education Topics Provided Wound/Skin Impairment: Handouts: Caring for Your Ulcer Methods: Explain/Verbal Responses: State content correctly Electronic Signature(s) Signed: 11/03/2017 5:06:21 PM By: Roger Shelter Entered By: Roger Shelter on 11/03/2017 09:23:12 [name] ([id number]) -------------------------------------------------------------------------------- Wound Assessment Details Patient Name: [name] Date of Service: 11/03/2017 8:45 AM Medical Record Number: [id number] Patient Account Number: [id number] Date of Birth/Sex: 01-Feb-1981 (36 y.o. M) Treating RN: Montey Hora Primary Care Myshawn Chir[place]: [name] Other Clinician: Referring Trynity Skousen: [name] Treating Eulanda Dorion/Extender: Melburn Hake, [name]: 47 Wound Status Wound Number: 1 Primary Etiology: Pyoderma Wound Location: Left Lower Leg - Lateral Wound Status: Open Wounding Event: Gradually Appeared Comorbid History: Sleep  Apnea, Hypertension, Colitis Date Acquired: 11/18/2015 Weeks Of Treatment: 47 Clustered Wound: No Photos Photo Uploaded By: Alric Quan on 11/03/2017 09:19:51 Wound Measurements Length: (cm) 0.6 Width: (cm) 1.2 Depth: (cm) 0.1 Area: (cm) 0.565 Volume: (cm) 0.057 % Reduction in Area: 88.5% % Reduction in Volume: 98.5% Epithelialization: Medium (34-66%) Tunneling: No Undermining: No Wound Description Full Thickness Without Exposed Support Classification: Structures Wound Margin: Distinct, outline attached Exudate Medium Amount: Exudate Type: Serous Exudate Color: amber Foul Odor After Cleansing: No Slough/Fibrino Yes Wound Bed Granulation Amount: None Present (0%) Exposed Structure Necrotic Amount: Large (67-100%) Fascia Exposed: No Necrotic Quality: Adherent Slough Fat Layer (Subcutaneous Tissue) Exposed: Yes Tendon Exposed: No Muscle Exposed: No Joint Exposed: No Bone Exposed: No Periwound Skin Texture Texture Color Scovill, Ryun ([id number]) No Abnormalities Noted: No No Abnormalities Noted: No Callus: No Atrophie Blanche: No Crepitus: No Cyanosis: No Excoriation: No Ecchymosis: No Induration: Yes Erythema: Yes Rash: No Erythema Location: Circumferential Scarring: No Erythema Change: Increased Hemosiderin Staining: Yes Moisture Mottled: No No Abnormalities Noted: No Pallor: No Dry / Scaly: No Rubor: No Maceration: No Temperature / Pain Temperature: No Abnormality Tenderness on Palpation: Yes Wound Preparation Ulcer Cleansing: Wound Cleanser Topical Anesthetic Applied: Other: Lidocaine 4%, Treatment Notes Wound #1 (Left, Lateral Lower Leg) 1. Cleansed with: Clean wound with Normal Saline 2. Anesthetic Topical Lidocaine 4% cream to wound bed prior to debridement 3. Peri-wound Care: Other peri-wound care (specify in notes) 5. Secondary Dressing Applied Bordered Foam Dressing Notes TCA cream and BF dressing Electronic  Signature(s) Signed: 11/03/2017 5:27:17 PM By: Montey Hora Entered By: Montey Hora on 11/03/2017 08:53:39 Hemstreet, [name] ([id number]) -------------------------------------------------------------------------------- [place] Details Patient Name: [name] Date of Service: 11/03/2017 8:45 AM Medical Record Number: [id number] Patient Account Number: [id number] Date of Birth/Sex: February 13, 1981 (36 y.o. M) Treating RN: Montey Hora Primary Care Boyd Buffalo: [name] Other Clinician: Referring Kischa Altice: [name] Treating Miracle Mongillo/Extender: Melburn Hake, [name]: 12 Vital Signs Time Taken: 08:50 Temperature (F): 98.0 Height (in): 71 Pulse (bpm): 88 Weight (lbs): 338 Respiratory Rate (breaths/min): 18 Body Mass Index (  BMI): 47.1 Blood Pressure (mmHg): 133/86 Reference Range: 80 - 120 mg / dl Electronic Signature(s) Signed: 11/03/2017 5:27:17 PM By: Montey Hora Entered By: Montey Hora on 11/03/2017 08:51:05

## 2017-11-05 NOTE — Progress Notes (Signed)
[name], [name] ([id number]) Visit Report for 11/03/2017 Chief Complaint Document Details Patient Name: [name], [name] Date of Service: 11/03/2017 8:45 AM Medical Record Number: [id number] Patient Account Number: [id number] Date of Birth/Sex: September 15, 1980 (37 y.o. M) Treating RN: [name] Primary Care Provider: Alma [name] Other Clinician: Referring Provider: Alma [name] Treating Provider/Extender: [name], [name] in Treatment: 20 Information Obtained from: Patient Chief Complaint He is here in follow up evaluation for LLE pyoderma ulcer Electronic Signature(s) Signed: 11/04/2017 1:56:08 AM By: [name] Keeler PA-C Entered By: [name] on 11/03/2017 09:12:54 [name] ([id number]) -------------------------------------------------------------------------------- HPI Details Patient Name: [name] Date of Service: 11/03/2017 8:45 AM Medical Record Number: [id number] Patient Account Number: [id number] Date of Birth/Sex: [birth year] (37 y.o. M) Treating RN: [name] Primary Care Provider: Alma [name] Other Clinician: Referring Provider: Alma [name] Treating Provider/Extender: [name], [name] [name] in Treatment: 66 History of Present Illness HPI Description: 12/04/16; 37 year old man who comes into the clinic today for review of a wound on the posterior left calf. He tells me that is been there for about a year. He is not a diabetic he does smoke half a pack per day. He was seen in the ER on 11/20/16 felt to have cellulitis around the wound and was given clindamycin. An x-ray did not show osteomyelitis. The patient initially tells me that he has a milk allergy that sets off a pruritic itching rash on his lower legs which she scratches incessantly and he thinks that's what may have set up the wound. He has been using various topical antibiotics and ointments without any effect. He works in a [employer]ing [employer] and is on his feet all day. He does not have a  prior history of wounds however he does have the rash on both lower legs the right arm and the ventral aspect of his left arm. These are excoriations and clearly have had scratching however there are of macular looking areas on both legs including a substantial larger area on the right leg. This does not have an underlying open area. There is no blistering. The patient tells me that 2 years ago in [place] in response to the rash on his legs he saw a dermatologist who told him he had a condition which may be pyoderma gangrenosum although I may be putting words into his mouth. He seemed to recognize this. 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 He is not in any treatment for this. He's had no recent travel 12/11/16; the patient arrives today with his wound and roughly the same condition we've been using silver alginate this is a deep punched out wound with some surrounding erythema but no tenderness. Biopsy I did did not show confirmed pyoderma gangrenosum suggested nonspecific inflammation and vasculitis but does not provide an actual description of what was seen by the pathologist. I'm really not able to understand this We have also received information from the patient's dermatologist in [place] notes from April 2016. This was a doctor [name]. 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 He was prescribed topical steroid high potency under occlusion which helped but at this point the patient did not have a deep punched out wound. 12/18/16; the patient's wound is larger in terms of surface area however this surface looks better and there is less depth. The surrounding erythema also is better. The patient states that the wrap we put on came off 2 days ago when he has been using  his compression stockings. He we are in the process of getting a dermatology consult. 12/26/16 on evaluation today patient's left lower extremity wound shows evidence  of infection with surrounding erythema noted. He has been tolerating the dressing changes but states that he has noted more discomfort. There is a larger area of erythema surrounding the wound. No fevers, chills, nausea, or vomiting noted at this time. With that being said the wound still does have slough covering the surface. He is not allergic to any medication that he is aware of at this point. In regard to his right lower extremity he had several regions that are erythematous and pruritic he wonders if there's anything we can do to help that. 01/02/17 I reviewed patient's wound culture which was obtained his visit last week. He was placed on doxycycline at that point. Unfortunately that does not appear to be an antibiotic that would likely help with the situation however the pseudomonas noted on culture is sensitive to Cipro. Also unfortunately patient's wound seems to have a large compared to last week's evaluation. Not severely so but there are definitely increased measurements in general. He is continuing to have discomfort as well he writes this to be a seven out of 10. In fact he would prefer me not to perform any debridement today due to the fact that he is having discomfort and considering he has an active infection on the little reluctant to do so anyway. No fevers, chills, nausea, or vomiting noted at this time. 01/08/17; patient seems dermatology on September 5. I suspect dermatology will want the slides from the biopsy I did sent to their pathologist. I'm not sure if there is a way we can expedite that. In any case the culture I did before I left on vacation 3 [name] ago showed Pseudomonas he was given 10 days of Cipro and per her description of her intake nurses is actually somewhat better this week although the wound is quite a bit bigger than I remember the last time I saw this. He still has 3 more days of Cipro [name] ([id number]) 01/21/17; dermatology appointment tomorrow. He  has completed the ciprofloxacin for Pseudomonas. Surface of the wound looks better however he is had some deterioration in the lesions on his right leg. Meantime the left lateral leg wound we will continue with sample 01/29/17; patient had his dermatology appointment but I can't yet see that note. He is completed his antibiotics. The wound is more superficial but considerably larger in circumferential area than when he came in. This is in his left lateral calf. He also has swollen erythematous areas with superficial wounds on the right leg and small papular areas on both arms. There apparently areas in her his upper thighs and buttocks I did not look at those. Dermatology biopsied the right leg. Hopefully will have their input next week. 02/05/17; patient went back to see his dermatologist who told him that he had a "scratching problem" as well as staph. He is now on a 30 day course of doxycycline and I believe she gave him triamcinolone cream to the right leg areas to help with the itching [not exactly sure but probably triamcinolone]. She apparently looked at the left lateral leg wound although this was not rebiopsied and I think felt to be ultimately part of the same pathogenesis. He is using sample border foam and changing nevus himself. He now has a new open area on the right posterior leg which was his biopsy site I don't  have any of the dermatology notes 02/12/17; we put the patient in compression last week with SANTYL to the wound on the left leg and the biopsy. Edema is much better and the depth of the wound is now at level of skin. Area is still the same oBiopsy site on the right lateral leg we've also been using santyl with a border foam dressing and he is changing this himself. 02/19/17; Using silver alginate started last week to both the substantial left leg wound and the biopsy site on the right wound. He is tolerating compression well. Has a an appointment with his primary M.D. tomorrow  wondering about diuretics although I'm wondering if the edema problem is actually lymphedema 02/26/17; the patient has been to see his primary doctor Dr. Jerrel Ivory at [facility] our primary care. She started him on Lasix 20 mg and this seems to have helped with the edema. However we are not making substantial change with the left lateral calf wound and inflammation. The biopsy site on the right leg also looks stable but not really all that different. 03/12/17; the patient has been to see vein and vascular Dr. Lucky Cowboy. He has had venous reflux studies I have not reviewed these. I did get a call from his dermatology office. 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 They therefore recommended clobetasol and tetracycline i.e. 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 03/26/17; apparently the patient just had reflux studies not an appointment with Dr. dew. She arrives in clinic today having applied clobetasol for 2-3 [name]. He notes over the last 2-3 days excessive drainage having to change the dressing 3-4 times a day and also expanding erythema. 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 Lillia Carmel at [name] dermatology locally who kindly saw him at my request. 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 My concern was the degree of erythema medially extending from his left leg wound which was either inflammation from  pyoderma or cellulitis. I put him on Augmentin however culture of the wound showed Pseudomonas which is quinolone sensitive. I really don't believe he has cellulitis however in view of everything I will continue and give him a course of Cipro. He is also on doxycycline as an immune modulator for the pyoderma. In addition to his original wound on the left lateral leg with surrounding erythema he has a wound on the right posterior calf which was an original biopsy site done by dermatology. 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 Saw Dr. Lillia Carmel yesterday. He has been using topical antibiotics to both wound areas his original wound on the left and the biopsies/pathergy area on the right. There is definitely some improvement in the inflammation around the wound on the right although the patient states he has increasing sensitivity of the wounds. He is on prednisone 60 and doxycycline 1 as prescribed by Dr. Lillia Carmel. He is covering the topical  antibiotic with gauze and putting this in his own compression stocks and changing this daily. He states that Dr. Lottie Rater did a culture of the left leg wound yesterday 05/07/17; pyoderma gangrenosum. The patient saw Dr. Lillia Carmel yesterday and has a follow-up with her in one month. He is still using topical antibiotics to both wounds although he can't recall exactly what type. He is still on prednisone 60 mg. Dr. Lillia Carmel stated that the doxycycline could stop if we were in agreement. 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 The right medial leg was induced by biopsy/pathergy. The area on the right is essentially healed. 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 The right medial leg has closed and  remains closed. He is still on prednisone 60. oHe tells me he missed his last dermatology appointment with Dr. Lillia Carmel but will make another appointment. He reports that her blood sugar at a recent screen in [place] was high 200's. He was 180 today. He is more cushingoid blood pressure is [name], [name] ([id number]) up a bit. I think he is going to require still much longer prednisone perhaps another 3 months before attempting to taper. In the meantime his wound is a lot better. Smaller. He is cleaning this off daily and applying topical antibiotics. When he was last in the clinic I thought about changing to [facility] and actually put in a couple of calls to dermatology although probably not during their business hours. 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 He continues on oral prednisone. He has been using triple antibiotic ointment. There is surface debris and we will transition to [facility] and have him return in 2 [name]. He has lost 30 pounds since his last appointment with lifestyle modification. He may benefit from topical steroid cream for treatment this can be considered at a later date. 08/22/17 on evaluation today patient appears to actually be doing rather well in regard to his left lateral lower extremity ulcer. He has actually been managed by Dr. Dellia Nims most recently. Patient is currently on oral steroids at this time. This seems to have been of benefit for him. Nonetheless his last visit was actually with Leah on 08/06/17. Currently he is not utilizing any topical steroid creams although this could be of benefit as well. No fevers, chills, nausea, or vomiting noted at this time. 09/05/17 on evaluation today patient appears to be doing better in regard to his left lateral lower extremity ulcer. He has been tolerating the dressing changes without complication.  He is using Santyl with good effect. Overall I'm very pleased with how things are standing at this point. Patient likewise is happy that this is doing better. 09/19/17 on evaluation today patient actually appears to be doing rather well in regard to his left lateral lower extremity ulcer. 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 He's not having any significant pain. 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 He has been tolerating the Santyl without complication and in general I feel like he's making good  progress. 10/17/17 on evaluation today patient appears to be doing very well in regard to his left lateral lower surety ulcer. He has been tolerating the dressing changes without complication. There does not appear to be any evidence of infection he's alternating the Santyl and the triple antibiotic ointment every other day this seems to be doing well for him. 11/03/17 on evaluation today patient appears to be doing very well in regard to his left lateral lower extremity ulcer. He is been tolerating the dressing changes without complication which is good news. Fortunately there does not appear to be any evidence of infection which is also great news. Overall is doing excellent they are starting to taper down on the prednisone is down to 40 mg at this point it also started topical clobetasol for him. Electronic Signature(s) Signed: 11/04/2017 1:56:08 AM By: [name] Keeler PA-C Entered By: [name] on 11/03/2017 09:18:33 [name], [name] ([id number]) -------------------------------------------------------------------------------- Physical Exam Details Patient Name: [name] Date of Service: 11/03/2017 8:45 AM Medical Record Number: [id number] Patient Account Number: [id number] Date of Birth/Sex: 21-May-1980  (36 y.o. M) Treating RN: [name] Primary Care Provider: Alma [name] Other Clinician: Referring Provider: Alma [name] Treating Provider/Extender: [name], Zissel Biederman [name] in Treatment: 28 Constitutional Well-nourished and well-hydrated in no acute distress. Respiratory normal breathing without difficulty. clear to auscultation bilaterally. Cardiovascular regular rate and rhythm with normal S1, S2. Psychiatric this patient is able to make decisions and demonstrates good insight into disease process. Alert and Oriented x 3. pleasant and cooperative. Notes Currently I'm very pleased with the fact that the patient seems to be showing signs of good improvement. Overall this is significantly smaller than when I evaluated him roughly 2 [name] ago. In general I feel like he's making excellent progress at this time. Electronic Signature(s) Signed: 11/04/2017 1:56:08 AM By: [name] Keeler PA-C Entered By: [name] on 11/03/2017 09:19:21 [name], [name] ([id number]) -------------------------------------------------------------------------------- Physician Orders Details Patient Name: [name] Date of Service: 11/03/2017 8:45 AM Medical Record Number: [id number] Patient Account Number: [id number] Date of Birth/Sex: 1980/08/01 (36 y.o. M) Treating RN: [name] Primary Care Provider: Alma [name] Other Clinician: Referring Provider: Alma [name] Treating Provider/Extender: [name], Mckaylee Dimalanta [name] in Treatment: 62 Verbal / Phone Orders: No Diagnosis Coding ICD-10 Coding Code Description [phone] 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sis of muscle L88 Pyoderma gangrenosum I87.2 Venous insufficiency (chronic) (peripheral) Wound Cleansing Wound #1 Left,Lateral Lower Leg o Clean wound with Normal Saline. Anesthetic (add to Medication List) Wound #1 Left,Lateral Lower Leg o Topical Lidocaine 4% cream applied to wound bed prior to debridement  (In Clinic Only). Skin Barriers/Peri-Wound Care Wound #1 Left,Lateral Lower Leg o Triamcinolone Acetonide Ointment (TCA) Secondary Dressing Wound #1 Left,Lateral Lower Leg o Boardered Foam Dressing Dressing Change Frequency Wound #1 Left,Lateral Lower Leg o Change dressing every day. Follow-up Appointments Wound #1 Left,Lateral Lower Leg o Return Appointment in 2 [name]. Edema Control Wound #1 Left,Lateral Lower Leg o Patient to wear own compression stockings Electronic Signature(s) Signed: 11/03/2017 5:06:21 PM By: [name] Signed: 11/04/2017 1:56:08 AM By: [name] Keeler PA-C Entered By: [name] on 11/03/2017 09:18:49 [name], [name] ([id number]) [name], [name] ([id number]) -------------------------------------------------------------------------------- Problem List Details Patient Name: [name] Date of Service: 11/03/2017 8:45 AM Medical Record Number: [id number] Patient Account Number: [id number] Date of Birth/Sex: [birth year] (37 y.o. M) Treating RN: [name] Primary Care Provider: Alma [name] Other Clinician: Referring Provider: Alma [name] Treating Provider/Extender: [name], Theresa Dohrman [name] in Treatment: 40 Active  Problems ICD-10 Impacting Encounter Code Description Active Date Wound Healing Diagnosis L97.223 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sis of muscle 12/04/2016 No Yes L88 Pyoderma gangrenosum 03/26/2017 No Yes I87.2 Venous insufficiency (chronic) (peripheral) 12/04/2016 No Yes Inactive Problems ICD-10 Code Description Active Date Inactive Date L97.213 Non-pressure chronic ulcer of right calf with necrosis of muscle 04/02/2017 04/02/2017 Resolved Problems Electronic Signature(s) Signed: 11/04/2017 1:56:08 AM By: [name] Keeler PA-C Entered By: [name] on 11/03/2017 09:12:47 [name] ([id number]) -------------------------------------------------------------------------------- Progress Note  Details Patient Name: [name] Date of Service: 11/03/2017 8:45 AM Medical Record Number: [id number] Patient Account Number: [id number] Date of Birth/Sex: [birth year] (36 y.o. M) Treating RN: [name] Primary Care Provider: Alma [name] Other Clinician: Referring Provider: Alma [name] Treating Provider/Extender: [name], Hiroshi Krummel [name] in Treatment: 27 Subjective Chief Complaint Information obtained from Patient He is here in follow up evaluation for LLE pyoderma ulcer History of Present Illness (HPI) 12/04/16; 37 year old man who comes into the clinic today for review of a wound on the posterior left calf. He tells me that is been there for about a year. He is not a diabetic he does smoke half a pack per day. He was seen in the ER on 11/20/16 felt to have cellulitis around the wound and was given clindamycin. An x-ray did not show osteomyelitis. The patient initially tells me that he has a milk allergy that sets off a pruritic itching rash on his lower legs which she scratches incessantly and he thinks that's what may have set up the wound. He has been using various topical antibiotics and ointments without any effect. He works in a [employer]ing [employer] and is on his feet all day. He does not have a prior history of wounds however he does have the rash on both lower legs the right arm and the ventral aspect of his left arm. These are excoriations and clearly have had scratching however there are of macular looking areas on both legs including a substantial larger area on the right leg. This does not have an underlying open area. There is no blistering. The patient tells me that 2 years ago in [place] in response to the rash on his legs he saw a dermatologist who told him he had a condition which may be pyoderma gangrenosum although I may be putting words into his mouth. He seemed to recognize this. 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 He  is not in any treatment for this. He's had no recent travel 12/11/16; the patient arrives today with his wound and roughly the same condition we've been using silver alginate this is a deep punched out wound with some surrounding erythema but no tenderness. Biopsy I did did not show confirmed pyoderma gangrenosum suggested nonspecific inflammation and vasculitis but does not provide an actual description of what was seen by the pathologist. I'm really not able to understand this We have also received information from the patient's dermatologist in [place] notes from April 2016. This was a doctor [name]. 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 He was prescribed topical steroid high potency under occlusion which helped but at this point the patient did not have a deep punched out wound. 12/18/16; the patient's wound is larger in terms of surface area however this surface looks better and there is less depth. The surrounding erythema also is better. The patient states that the wrap we put on came off 2 days ago  when he has been using his compression stockings. He we are in the process of getting a dermatology consult. 12/26/16 on evaluation today patient's left lower extremity wound shows evidence of infection with surrounding erythema noted. He has been tolerating the dressing changes but states that he has noted more discomfort. There is a larger area of erythema surrounding the wound. No fevers, chills, nausea, or vomiting noted at this time. With that being said the wound still does have slough covering the surface. He is not allergic to any medication that he is aware of at this point. In regard to his right lower extremity he had several regions that are erythematous and pruritic he wonders if there's anything we can do to help that. 01/02/17 I reviewed patient's wound culture which was obtained his visit last week. He was placed on doxycycline at that point. Unfortunately  that does not appear to be an antibiotic that would likely help with the situation however the pseudomonas noted on culture is sensitive to Cipro. Also unfortunately patient's wound seems to have a large compared to last week's evaluation. Not severely so but there are definitely increased measurements in general. He is continuing to have discomfort as well he writes this to be a seven out of 10. In fact he would prefer me not to perform any debridement today due to the fact that he is having discomfort and considering he has an active infection on the little reluctant to do so anyway. No fevers, [name], [name] ([id number]) chills, nausea, or vomiting noted at this time. 01/08/17; patient seems dermatology on September 5. I suspect dermatology will want the slides from the biopsy I did sent to their pathologist. I'm not sure if there is a way we can expedite that. In any case the culture I did before I left on vacation 3 [name] ago showed Pseudomonas he was given 10 days of Cipro and per her description of her intake nurses is actually somewhat better this week although the wound is quite a bit bigger than I remember the last time I saw this. He still has 3 more days of Cipro 01/21/17; dermatology appointment tomorrow. He has completed the ciprofloxacin for Pseudomonas. Surface of the wound looks better however he is had some deterioration in the lesions on his right leg. Meantime the left lateral leg wound we will continue with sample 01/29/17; patient had his dermatology appointment but I can't yet see that note. He is completed his antibiotics. The wound is more superficial but considerably larger in circumferential area than when he came in. This is in his left lateral calf. He also has swollen erythematous areas with superficial wounds on the right leg and small papular areas on both arms. There apparently areas in her his upper thighs and buttocks I did not look at those. Dermatology biopsied the  right leg. Hopefully will have their input next week. 02/05/17; patient went back to see his dermatologist who told him that he had a "scratching problem" as well as staph. He is now on a 30 day course of doxycycline and I believe she gave him triamcinolone cream to the right leg areas to help with the itching [not exactly sure but probably triamcinolone]. She apparently looked at the left lateral leg wound although this was not rebiopsied and I think felt to be ultimately part of the same pathogenesis. He is using sample border foam and changing nevus himself. He now has a new open area on the right posterior leg which was  his biopsy site I don't have any of the dermatology notes 02/12/17; we put the patient in compression last week with SANTYL to the wound on the left leg and the biopsy. Edema is much better and the depth of the wound is now at level of skin. Area is still the same Biopsy site on the right lateral leg we've also been using santyl with a border foam dressing and he is changing this himself. 02/19/17; Using silver alginate started last week to both the substantial left leg wound and the biopsy site on the right wound. He is tolerating compression well. Has a an appointment with his primary M.D. tomorrow wondering about diuretics although I'm wondering if the edema problem is actually lymphedema 02/26/17; the patient has been to see his primary doctor Dr. Jerrel Ivory at [facility] our primary care. She started him on Lasix 20 mg and this seems to have helped with the edema. However we are not making substantial change with the left lateral calf wound and inflammation. The biopsy site on the right leg also looks stable but not really all that different. 03/12/17; the patient has been to see vein and vascular Dr. Lucky Cowboy. He has had venous reflux studies I have not reviewed these. I did get a call from his dermatology office. They felt that he might have pathergy based on their biopsy on his  right leg which led them to look at the slides of the biopsy I did on the left leg and they wonder whether this represents pyoderma gangrenosum which was the original supposition in a man with ulcerative colitis albeit inactive for many years. They therefore recommended clobetasol and tetracycline i.e. 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 03/26/17; apparently the patient just had reflux studies not an appointment with Dr. dew. She arrives in clinic today having applied clobetasol for 2-3 [name]. He notes over the last 2-3 days excessive drainage having to change the dressing 3-4 times a day and also expanding erythema. 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 Lillia Carmel at [facility] dermatology locally who kindly saw him at my request. 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 My concern was the degree of erythema medially extending from his left leg wound which was either inflammation from pyoderma or cellulitis. I put him on Augmentin however culture of the wound showed Pseudomonas which is quinolone sensitive. I really don't believe he has cellulitis however in view of everything I will continue and give him a course of Cipro. He is also on doxycycline as an immune modulator for the pyoderma. In addition to his original wound on the left lateral leg with surrounding erythema he has a wound on the right posterior calf which was an original biopsy site done by dermatology. 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 Saw Dr. Lillia Carmel yesterday. He has been using topical antibiotics to both wound areas his original wound  on the left and the biopsies/pathergy area on the right. There is definitely some improvement in the inflammation around the wound on the right although the patient states he has increasing sensitivity of the wounds. He is on prednisone 60 and doxycycline 1 as prescribed by Dr. Lillia Carmel.  He is covering the topical antibiotic with gauze and putting this in his own compression stocks and changing this daily. He states that Dr. Lottie Rater did a culture of the left leg wound yesterday 05/07/17; pyoderma gangrenosum. The patient saw Dr. Lillia Carmel yesterday and has a follow-up with her in one month. He is still using topical antibiotics to both wounds although he can't recall exactly what type. He is still on prednisone 60 mg. Dr. Lillia Carmel stated that the doxycycline could stop if we were in agreement. 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 The right medial leg was induced by [name] ([id number]) biopsy/pathergy. The area on the right is essentially healed. 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 The right medial leg has closed and remains closed. He is still on prednisone 60. He tells me he missed his last dermatology appointment with Dr. Lillia Carmel but will make another appointment. He reports that her blood sugar at a recent screen in [place] was high 200's. He was 180 today. 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 I think he is going to require still much longer prednisone perhaps another 3 months before attempting to taper. In the meantime his wound is a lot better. Smaller. He is cleaning this off daily and applying topical antibiotics. When he was last in the clinic I thought about changing to [facility] and actually put in a couple of calls to dermatology although probably not  during their business hours. 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 He continues on oral prednisone. He has been using triple antibiotic ointment. There is surface debris and we will transition to [facility] and have him return in 2 [name]. He has lost 30 pounds since his last appointment with lifestyle modification. He may benefit from topical steroid cream for treatment this can be considered at a later date. 08/22/17 on evaluation today patient appears to actually be doing rather well in regard to his left lateral lower extremity ulcer. He has actually been managed by Dr. Dellia Nims most recently. Patient is currently on oral steroids at this time. This seems to have been of benefit for him. Nonetheless his last visit was actually with Leah on 08/06/17. Currently he is not utilizing any topical steroid creams although this could be of benefit as well. No fevers, chills, nausea, or vomiting noted at this time. 09/05/17 on evaluation today patient appears to be doing better in regard to his left lateral lower extremity ulcer. He has been tolerating the dressing changes without complication. He is using Santyl with good effect. Overall I'm very pleased with how things are standing at this point. Patient likewise is happy that this is doing better. 09/19/17 on evaluation today patient actually appears to be doing rather well in regard to his left lateral lower extremity ulcer. 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 He's not having any significant pain. 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 He has been tolerating the Santyl without complication and in general I feel  like he's making  good progress. 10/17/17 on evaluation today patient appears to be doing very well in regard to his left lateral lower surety ulcer. He has been tolerating the dressing changes without complication. There does not appear to be any evidence of infection he's alternating the Santyl and the triple antibiotic ointment every other day this seems to be doing well for him. 11/03/17 on evaluation today patient appears to be doing very well in regard to his left lateral lower extremity ulcer. He is been tolerating the dressing changes without complication which is good news. Fortunately there does not appear to be any evidence of infection which is also great news. Overall is doing excellent they are starting to taper down on the prednisone is down to 40 mg at this point it also started topical clobetasol for him. Patient History Information obtained from Patient. Family History Diabetes - Mother,Maternal Grandparents, Heart Disease - Mother,Maternal Grandparents, Hereditary Spherocytosis - Siblings, Hypertension - Maternal Grandparents, Stroke - Siblings, No family history of Cancer, Kidney Disease, Lung Disease, Seizures, Thyroid Problems, Tuberculosis. Social History Current some day smoker, Marital Status - Married, Alcohol Use - Rarely, Drug Use - No History, Caffeine Use - Moderate. Review of Systems (ROS) Constitutional Symptoms (General Health) Denies complaints or symptoms of Fever, Chills. Respiratory The patient has no complaints or symptoms. Cardiovascular The patient has no complaints or symptoms. [name], [name] ([id number]) Psychiatric The patient has no complaints or symptoms. Objective Constitutional Well-nourished and well-hydrated in no acute distress. Vitals Time Taken: 8:50 AM, Height: 71 in, Weight: 338 lbs, BMI: 47.1, Temperature: 98.0 F, Pulse: 88 bpm, Respiratory Rate: 18 breaths/min, Blood Pressure: 133/86 mmHg. Respiratory normal breathing without difficulty. clear to  auscultation bilaterally. Cardiovascular regular rate and rhythm with normal S1, S2. Psychiatric this patient is able to make decisions and demonstrates good insight into disease process. Alert and Oriented x 3. pleasant and cooperative. General Notes: Currently I'm very pleased with the fact that the patient seems to be showing signs of good improvement. Overall this is significantly smaller than when I evaluated him roughly 2 [name] ago. In general I feel like he's making excellent progress at this time. Integumentary (Hair, Skin) Wound #1 status is Open. Original cause of wound was Gradually Appeared. The wound is located on the Left,Lateral Lower Leg. The wound measures 0.6cm length x 1.2cm width x 0.1cm depth; 0.565cm^2 area and 0.057cm^3 volume. There is Fat Layer (Subcutaneous Tissue) Exposed exposed. There is no tunneling or undermining noted. There is a medium amount of serous drainage noted. The wound margin is distinct with the outline attached to the wound base. There is no granulation within the wound bed. There is a large (67-100%) amount of necrotic tissue within the wound bed including Adherent Slough. The periwound skin appearance exhibited: Induration, Hemosiderin Staining, Erythema. The periwound skin appearance did not exhibit: Callus, Crepitus, Excoriation, Rash, Scarring, Dry/Scaly, Maceration, Atrophie Blanche, Cyanosis, Ecchymosis, Mottled, Pallor, Rubor. The surrounding wound skin color is noted with erythema which is circumferential. Periwound temperature was noted as No Abnormality. The periwound has tenderness on palpation. 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 Anesthetic (add to Medication List): Wound #1 Left,Lateral Lower  Leg: Topical Lidocaine 4% cream applied to wound bed prior to debridement (In Clinic Only). Skin Barriers/Peri-Wound Care: Wound #1 Left,Lateral Lower Leg: Triamcinolone Acetonide Ointment (  TCA) Secondary Dressing: Wound #1 Left,Lateral Lower Leg: Boardered Foam Dressing Dressing Change Frequency: Wound #1 Left,Lateral Lower Leg: Change dressing every day. Follow-up Appointments: Wound #1 Left,Lateral Lower Leg: Return Appointment in 2 [name]. Edema Control: Wound #1 Left,Lateral Lower Leg: Patient to wear own compression stockings We are gonna continue with the Current wound care measures for the next week. Patient is in agreement with plan. We will subsequently see were things stand at follow-up. Please see above for specific wound care orders. We will see patient for re-evaluation in 2 week(s) here in the clinic. If anything worsens or changes patient will contact our office for additional recommendations. Electronic Signature(s) Signed: 11/04/2017 1:56:08 AM By: [name] Keeler PA-C Entered By: [name] on 11/03/2017 09:19:43 [name], [name] ([id number]) -------------------------------------------------------------------------------- ROS/PFSH Details Patient Name: [name] Date of Service: 11/03/2017 8:45 AM Medical Record Number: [id number] Patient Account Number: [id number] Date of Birth/Sex: Nov 11, 1980 (36 y.o. M) Treating RN: [name] Primary Care Provider: Alma [name] Other Clinician: Referring Provider: Alma [name] Treating Provider/Extender: [name], Buffey Zabinski [name] in Treatment: 23 Information Obtained From Patient Wound History Do you currently have one or more open woundso Yes How many open wounds do you currently haveo 1 Approximately how long have you had your woundso 1 year How have you been treating your wound(s) until nowo neosporin Has your wound(s) ever healed and then re-openedo No Have you had any lab work done in the past montho  No Have you tested positive for an antibiotic resistant organism (MRSA, VRE)o No Have you tested positive for osteomyelitis (bone infection)o No Have you had any tests for circulation on your legso No Have you had other problems associated with your woundso Infection, Swelling Constitutional Symptoms (General Health) Complaints and Symptoms: Negative for: Fever; Chills Eyes Medical History: Negative for: Cataracts; Glaucoma; Optic Neuritis Ear/Nose/Mouth/Throat Medical History: Negative for: Chronic sinus problems/congestion; Middle ear problems Hematologic/Lymphatic Medical History: Negative for: Anemia; Hemophilia; Human Immunodeficiency Virus; Lymphedema; Sickle Cell Disease Respiratory Complaints and Symptoms: No Complaints or Symptoms Medical History: Positive for: Sleep Apnea - cpap Negative for: Aspiration; Asthma; Chronic Obstructive Pulmonary Disease (COPD); Pneumothorax Cardiovascular Complaints and Symptoms: No Complaints or Symptoms Medical History: Positive for: Hypertension [name], [name] ([id number]) Negative for: Angina; Arrhythmia; Congestive Heart Failure; Coronary Artery Disease; Deep Vein Thrombosis; Hypotension; Myocardial Infarction; Peripheral Arterial Disease; Peripheral Venous Disease; Phlebitis; Vasculitis Gastrointestinal Medical History: Positive for: Colitis Endocrine Medical History: Negative for: Type I Diabetes; Type II Diabetes Genitourinary Medical History: Negative for: End Stage Renal Disease Immunological Medical History: Negative for: Lupus Erythematosus; Raynaudos; Scleroderma Integumentary (Skin) Medical History: Negative for: History of Burn; History of pressure wounds Musculoskeletal Medical History: Negative for: Gout; Rheumatoid Arthritis; Osteoarthritis; Osteomyelitis Neurologic Medical History: Negative for: Dementia; Neuropathy; Quadriplegia; Paraplegia; Seizure Disorder Oncologic Medical History: Negative for:  Received Chemotherapy; Received Radiation Psychiatric Complaints and Symptoms: No Complaints or Symptoms Medical History: Negative for: Anorexia/bulimia; Confinement Anxiety Immunizations Pneumococcal Vaccine: Received Pneumococcal Vaccination: No Implantable Devices Family and Social History ARA, MANO ([id number]) Cancer: No; Diabetes: Yes - Mother,Maternal Grandparents; Heart Disease: Yes - Mother,Maternal Grandparents; Hereditary Spherocytosis: Yes - Siblings; Hypertension: Yes - Maternal Grandparents; Kidney Disease: No; Lung Disease: No; Seizures: No; Stroke: Yes - Siblings; Thyroid Problems: No; Tuberculosis: No; Current some day smoker; Marital Status - Married; Alcohol Use: Rarely; Drug Use: No History; Caffeine Use: Moderate; Financial Concerns: No; Food, Clothing or [name] Needs: No; Support System Lacking: No; Transportation Concerns: No; Advanced Directives: No; Patient does not want information on Advanced  Directives; Living Will: No Physician Affirmation I have reviewed and agree with the above information. Electronic Signature(s) Signed: 11/03/2017 5:06:21 PM By: [name] Signed: 11/04/2017 1:56:08 AM By: [name] Keeler PA-C Entered By: [name] on 11/03/2017 09:19:03 [name] ([id number]) -------------------------------------------------------------------------------- SuperBill Details Patient Name: [name] Date of Service: 11/03/2017 Medical Record Number: [id number] Patient Account Number: [id number] Date of Birth/Sex: 1981/02/09 (36 y.o. M) Treating RN: [name] Primary Care Provider: Alma [name] Other Clinician: Referring Provider: Alma [name] Treating Provider/Extender: [name], Teoman Giraud [name] in Treatment: 67 Diagnosis Coding ICD-10 Codes Code Description [phone] 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sis of muscle L88 Pyoderma gangrenosum I87.2 Venous insufficiency (chronic) (peripheral) Facility  Procedures CPT4 Code: [id number] Description: [phone] - WOUND CARE VISIT-LEV 2 EST PT Modifier: Quantity: 1 Physician Procedures CPT4 Code: [id number] Description: 20254 - WC PHYS LEVEL 3 - EST PT ICD-10 Diagnosis Description L97.223 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sis of m L88 Pyoderma gangrenosum I87.2 Venous insufficiency (chronic) (peripheral) Modifier: uscle Quantity: 1 Electronic Signature(s) Signed: 11/04/2017 1:56:08 AM By: [name] Keeler PA-C Entered By: [name] on 11/03/2017 09:19:57

## 2017-11-17 ENCOUNTER — Encounter: Payer: 59 | Attending: Physician Assistant | Admitting: Physician Assistant

## 2017-11-17 DIAGNOSIS — I10 Essential (primary) hypertension: Secondary | ICD-10-CM | POA: Diagnosis not present

## 2017-11-17 DIAGNOSIS — G4730 Sleep apnea, unspecified: Secondary | ICD-10-CM | POA: Diagnosis not present

## 2017-11-17 DIAGNOSIS — L88 Pyoderma gangrenosum: Secondary | ICD-10-CM | POA: Diagnosis not present

## 2017-11-17 DIAGNOSIS — L97222 Non-pressure chronic ulcer of left calf with fat layer exposed: Secondary | ICD-10-CM | POA: Insufficient documentation

## 2017-11-20 NOTE — Progress Notes (Signed)
[name], [name] ([id number]) Visit Report for 11/17/2017 Chief Complaint Document Details Patient Name: [name], [name] Date of Service: 11/17/2017 8:00 AM Medical Record Number: [id number] Patient Account Number: [id number] Date of Birth/Sex: [birth year] (37 y.o. M) Treating RN: Roger Shelter Primary Care Provider: [name] Other Clinician: Referring Provider: [name] Treating Provider/Extender: Melburn Hake, Moishe Schellenberg Weeks in Treatment: 46 Information Obtained from: Patient Chief Complaint He is here in follow up evaluation for LLE pyoderma ulcer Electronic Signature(s) Signed: 11/18/2017 8:11:30 AM By: Worthy Keeler PA-C Entered By: Worthy Keeler on 11/17/2017 08:18:41 [name], [name] ([id number]) -------------------------------------------------------------------------------- HPI Details Patient Name: [name] Date of Service: 11/17/2017 8:00 AM Medical Record Number: [id number] Patient Account Number: [id number] Date of Birth/Sex: [birth year] (36 y.o. M) Treating RN: Roger Shelter Primary Care Provider: [name] Other Clinician: Referring Provider: [name] Treating Provider/Extender: Melburn Hake, Khaya Theissen Weeks in Treatment: 72 History of Present Illness HPI Description: 12/04/16; 37 year old man who comes into the clinic today for review of a wound on the posterior left calf. He tells me that is been there for about a year. He is not a diabetic he does smoke half a pack per day. He was seen in the ER on 11/20/16 felt to have cellulitis around the wound and was given clindamycin. An x-ray did not show osteomyelitis. The patient initially tells me that he has a milk allergy that sets off a pruritic itching rash on his lower legs which she scratches incessantly and he thinks that's what may have set up the wound. He has been using various topical antibiotics and ointments without any effect. He works in a [employer]ing [employer] and is on his feet all day. He does not have a  prior history of wounds however he does have the rash on both lower legs the right arm and the ventral aspect of his left arm. These are excoriations and clearly have had scratching however there are of macular looking areas on both legs including a substantial larger area on the right leg. This does not have an underlying open area. There is no blistering. The patient tells me that 2 years ago in [place] in response to the rash on his legs he saw a dermatologist who told him he had a condition which may be pyoderma gangrenosum although I may be putting words into his mouth. He seemed to recognize this. 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 He is not in any treatment for this. He's had no recent travel 12/11/16; the patient arrives today with his wound and roughly the same condition we've been using silver alginate this is a deep punched out wound with some surrounding erythema but no tenderness. Biopsy I did did not show confirmed pyoderma gangrenosum suggested nonspecific inflammation and vasculitis but does not provide an actual description of what was seen by the pathologist. I'm really not able to understand this We have also received information from the patient's dermatologist in [place] notes from April 2016. This was a doctor Agarwal- antal. 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 He was prescribed topical steroid high potency under occlusion which helped but at this point the patient did not have a deep punched out wound. 12/18/16; the patient's wound is larger in terms of surface area however this surface looks better and there is less depth. The surrounding erythema also is better. The patient states that the wrap we put on came off 2 days ago when he has been using  his compression stockings. He we are in the process of getting a dermatology consult. 12/26/16 on evaluation today patient's left lower extremity wound shows evidence  of infection with surrounding erythema noted. He has been tolerating the dressing changes but states that he has noted more discomfort. There is a larger area of erythema surrounding the wound. No fevers, chills, nausea, or vomiting noted at this time. With that being said the wound still does have slough covering the surface. He is not allergic to any medication that he is aware of at this point. In regard to his right lower extremity he had several regions that are erythematous and pruritic he wonders if there's anything we can do to help that. 01/02/17 I reviewed patient's wound culture which was obtained his visit last week. He was placed on doxycycline at that point. Unfortunately that does not appear to be an antibiotic that would likely help with the situation however the pseudomonas noted on culture is sensitive to Cipro. Also unfortunately patient's wound seems to have a large compared to last week's evaluation. Not severely so but there are definitely increased measurements in general. He is continuing to have discomfort as well he writes this to be a seven out of 10. In fact he would prefer me not to perform any debridement today due to the fact that he is having discomfort and considering he has an active infection on the little reluctant to do so anyway. No fevers, chills, nausea, or vomiting noted at this time. 01/08/17; patient seems dermatology on September 5. I suspect dermatology will want the slides from the biopsy I did sent to their pathologist. I'm not sure if there is a way we can expedite that. In any case the culture I did before I left on vacation 3 weeks ago showed Pseudomonas he was given 10 days of Cipro and per her description of her intake nurses is actually somewhat better this week although the wound is quite a bit bigger than I remember the last time I saw this. He still has 3 more days of Cipro RIDGELY, ANASTACIO ([id number]) 01/21/17; dermatology appointment tomorrow. He  has completed the ciprofloxacin for Pseudomonas. Surface of the wound looks better however he is had some deterioration in the lesions on his right leg. Meantime the left lateral leg wound we will continue with sample 01/29/17; patient had his dermatology appointment but I can't yet see that note. He is completed his antibiotics. The wound is more superficial but considerably larger in circumferential area than when he came in. This is in his left lateral calf. He also has swollen erythematous areas with superficial wounds on the right leg and small papular areas on both arms. There apparently areas in her his upper thighs and buttocks I did not look at those. Dermatology biopsied the right leg. Hopefully will have their input next week. 02/05/17; patient went back to see his dermatologist who told him that he had a "scratching problem" as well as staph. He is now on a 30 day course of doxycycline and I believe she gave him triamcinolone cream to the right leg areas to help with the itching [not exactly sure but probably triamcinolone]. She apparently looked at the left lateral leg wound although this was not rebiopsied and I think felt to be ultimately part of the same pathogenesis. He is using sample border foam and changing nevus himself. He now has a new open area on the right posterior leg which was his biopsy site I don't  have any of the dermatology notes 02/12/17; we put the patient in compression last week with SANTYL to the wound on the left leg and the biopsy. Edema is much better and the depth of the wound is now at level of skin. Area is still the same oBiopsy site on the right lateral leg we've also been using santyl with a border foam dressing and he is changing this himself. 02/19/17; Using silver alginate started last week to both the substantial left leg wound and the biopsy site on the right wound. He is tolerating compression well. Has a an appointment with his primary M.D. tomorrow  wondering about diuretics although I'm wondering if the edema problem is actually lymphedema 02/26/17; the patient has been to see his primary doctor Dr. Jerrel Ivory at [facility] our primary care. She started him on Lasix 20 mg and this seems to have helped with the edema. However we are not making substantial change with the left lateral calf wound and inflammation. The biopsy site on the right leg also looks stable but not really all that different. 03/12/17; the patient has been to see vein and vascular Dr. Lucky Cowboy. He has had venous reflux studies I have not reviewed these. I did get a call from his dermatology office. 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 They therefore recommended clobetasol and tetracycline i.e. 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 03/26/17; apparently the patient just had reflux studies not an appointment with Dr. dew. She arrives in clinic today having applied clobetasol for 2-3 weeks. He notes over the last 2-3 days excessive drainage having to change the dressing 3-4 times a day and also expanding erythema. 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 Lillia Carmel at [facility] dermatology locally who kindly saw him at my request. 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 My concern was the degree of erythema medially extending from his left leg wound which was either inflammation from  pyoderma or cellulitis. I put him on Augmentin however culture of the wound showed Pseudomonas which is quinolone sensitive. I really don't believe he has cellulitis however in view of everything I will continue and give him a course of Cipro. He is also on doxycycline as an immune modulator for the pyoderma. In addition to his original wound on the left lateral leg with surrounding erythema he has a wound on the right posterior calf which was an original biopsy site done by dermatology. 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 Saw Dr. Lillia Carmel yesterday. He has been using topical antibiotics to both wound areas his original wound on the left and the biopsies/pathergy area on the right. There is definitely some improvement in the inflammation around the wound on the right although the patient states he has increasing sensitivity of the wounds. He is on prednisone 60 and doxycycline 1 as prescribed by Dr. Lillia Carmel. He is covering the topical  antibiotic with gauze and putting this in his own compression stocks and changing this daily. He states that Dr. Lottie Rater did a culture of the left leg wound yesterday 05/07/17; pyoderma gangrenosum. The patient saw Dr. Lillia Carmel yesterday and has a follow-up with her in one month. He is still using topical antibiotics to both wounds although he can't recall exactly what type. He is still on prednisone 60 mg. Dr. Lillia Carmel stated that the doxycycline could stop if we were in agreement. 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 The right medial leg was induced by biopsy/pathergy. The area on the right is essentially healed. 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 The right medial leg has closed and  remains closed. He is still on prednisone 60. oHe tells me he missed his last dermatology appointment with Dr. Lillia Carmel but will make another appointment. He reports that her blood sugar at a recent screen in [place] was high 200's. He was 180 today. He is more cushingoid blood pressure is [name], [name] ([id number]) up a bit. I think he is going to require still much longer prednisone perhaps another 3 months before attempting to taper. In the meantime his wound is a lot better. Smaller. He is cleaning this off daily and applying topical antibiotics. When he was last in the clinic I thought about changing to [facility] and actually put in a couple of calls to dermatology although probably not during their business hours. 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 He continues on oral prednisone. He has been using triple antibiotic ointment. There is surface debris and we will transition to [facility] and have him return in 2 weeks. He has lost 30 pounds since his last appointment with lifestyle modification. He may benefit from topical steroid cream for treatment this can be considered at a later date. 08/22/17 on evaluation today patient appears to actually be doing rather well in regard to his left lateral lower extremity ulcer. He has actually been managed by Dr. Dellia Nims most recently. Patient is currently on oral steroids at this time. This seems to have been of benefit for him. Nonetheless his last visit was actually with Leah on 08/06/17. Currently he is not utilizing any topical steroid creams although this could be of benefit as well. No fevers, chills, nausea, or vomiting noted at this time. 09/05/17 on evaluation today patient appears to be doing better in regard to his left lateral lower extremity ulcer. He has been tolerating the dressing changes without complication.  He is using Santyl with good effect. Overall I'm very pleased with how things are standing at this point. Patient likewise is happy that this is doing better. 09/19/17 on evaluation today patient actually appears to be doing rather well in regard to his left lateral lower extremity ulcer. 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 He's not having any significant pain. 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 He has been tolerating the Santyl without complication and in general I feel like he's making good  progress. 10/17/17 on evaluation today patient appears to be doing very well in regard to his left lateral lower surety ulcer. He has been tolerating the dressing changes without complication. There does not appear to be any evidence of infection he's alternating the Santyl and the triple antibiotic ointment every other day this seems to be doing well for him. 11/03/17 on evaluation today patient appears to be doing very well in regard to his left lateral lower extremity ulcer. He is been tolerating the dressing changes without complication which is good news. Fortunately there does not appear to be any evidence of infection which is also great news. Overall is doing excellent they are starting to taper down on the prednisone is down to 40 mg at this point it also started topical clobetasol for him. 11/17/17 on evaluation today patient appears to be doing well in regard to his left lateral lower surety ulcer. He's been tolerating the dressing changes without complication. He does note that he is having no pain, no excessive drainage or discharge, and overall he feels like things are going about how he would expect and hope they would. Overall he seems to have no evidence of infection at this time in my opinion which is  good news. Electronic Signature(s) Signed: 11/18/2017 8:11:30 AM By: Worthy Keeler PA-C Entered By: Worthy Keeler on 11/17/2017 08:53:37 [name], [name] ([id number]) -------------------------------------------------------------------------------- Physical Exam Details Patient Name: [name] Date of Service: 11/17/2017 8:00 AM Medical Record Number: [id number] Patient Account Number: [id number] Date of Birth/Sex: 10-06-1980 (36 y.o. M) Treating RN: Roger Shelter Primary Care Provider: [name] Other Clinician: Referring Provider: [name] Treating Provider/Extender: Melburn Hake, Delenn Ahn Weeks in Treatment: 30 Constitutional Chronically ill appearing but in no apparent acute distress. Respiratory normal breathing without difficulty. Cardiovascular 1+ pitting edema of the bilateral lower extremities. Psychiatric this patient is able to make decisions and demonstrates good insight into disease process. Alert and Oriented x 3. pleasant and cooperative. Notes Patient's wound bed shows evidence of good granulation there is some Slough noted but fortunately nothing too significant at this point. He does not appear to be showing any signs of infection at this point which is good news. There still erythema surrounding the wound but this is more of the chronic type which seems to in my pinion actually be fading around the edges but nonetheless she still has some issues at this point. Overall I'm pleased with the progress he seems to be making. Electronic Signature(s) Signed: 11/18/2017 8:11:30 AM By: Worthy Keeler PA-C Entered By: Worthy Keeler on 11/17/2017 08:54:32 [name], [name] ([id number]) -------------------------------------------------------------------------------- Physician Orders Details Patient Name: [name] Date of Service: 11/17/2017 8:00 AM Medical Record Number: [id number] Patient Account Number: [id number] Date of Birth/Sex: Aug 11, 1980 (36 y.o. M) Treating  RN: Roger Shelter Primary Care Provider: [name] Other Clinician: Referring Provider: [name] Treating Provider/Extender: Melburn Hake, Aedon Deason Weeks in Treatment: 29 Verbal / Phone Orders: No Diagnosis Coding ICD-10 Coding Code Description [phone] 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sis of muscle L88 Pyoderma gangrenosum I87.2 Venous insufficiency (chronic) (peripheral) Wound Cleansing Wound #1 Left,Lateral Lower Leg o Clean wound with Normal Saline. Anesthetic (add to Medication List) Wound #1 Left,Lateral Lower Leg o Topical Lidocaine 4% cream applied to wound bed prior to debridement (In Clinic Only). Skin Barriers/Peri-Wound Care Wound #1 Left,Lateral Lower Leg o Triamcinolone Acetonide Ointment (TCA) Secondary Dressing Wound #1 Left,Lateral Lower Leg o Boardered Foam Dressing Dressing Change Frequency Wound #1 Left,Lateral Lower Leg   o Change dressing every day. Follow-up Appointments Wound #1 Left,Lateral Lower Leg o Return Appointment in 2 weeks. Edema Control Wound #1 Left,Lateral Lower Leg o Patient to wear own compression stockings Electronic Signature(s) Signed: 11/18/2017 8:11:30 AM By: Worthy Keeler PA-C Signed: 11/19/2017 3:34:51 PM By: Roger Shelter Entered By: Roger Shelter on 11/17/2017 08:27:28 [name], [name] ([id number]) [name], [name] ([id number]) -------------------------------------------------------------------------------- Problem List Details Patient Name: [name] Date of Service: 11/17/2017 8:00 AM Medical Record Number: [id number] Patient Account Number: [id number] Date of Birth/Sex: Oct 01, 1980 (37 y.o. M) Treating RN: Roger Shelter Primary Care Provider: [name] Other Clinician: Referring Provider: [name] Treating Provider/Extender: Melburn Hake, Breylan Lefevers Weeks in Treatment: 54 Active Problems ICD-10 Evaluated Encounter Code Description Active Date Today Diagnosis L97.223  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sis of muscle 12/04/2016 No Yes L88 Pyoderma gangrenosum 03/26/2017 No Yes I87.2 Venous insufficiency (chronic) (peripheral) 12/04/2016 No Yes Inactive Problems ICD-10 Code Description Active Date Inactive Date L97.213 Non-pressure chronic ulcer of right calf with necrosis of muscle 04/02/2017 04/02/2017 Resolved Problems Electronic Signature(s) Signed: 11/18/2017 8:11:30 AM By: Worthy Keeler PA-C Entered By: Worthy Keeler on 11/17/2017 08:18:34 [name] ([id number]) -------------------------------------------------------------------------------- Progress Note Details Patient Name: [name] Date of Service: 11/17/2017 8:00 AM Medical Record Number: [id number] Patient Account Number: [id number] Date of Birth/Sex: 05/29/1980 (36 y.o. M) Treating RN: Roger Shelter Primary Care Provider: [name] Other Clinician: Referring Provider: [name] Treating Provider/Extender: Melburn Hake, Riku Buttery Weeks in Treatment: 57 Subjective Chief Complaint Information obtained from Patient He is here in follow up evaluation for LLE pyoderma ulcer History of Present Illness (HPI) 12/04/16; 37 year old man who comes into the clinic today for review of a wound on the posterior left calf. He tells me that is been there for about a year. He is not a diabetic he does smoke half a pack per day. He was seen in the ER on 11/20/16 felt to have cellulitis around the wound and was given clindamycin. An x-ray did not show osteomyelitis. The patient initially tells me that he has a milk allergy that sets off a pruritic itching rash on his lower legs which she scratches incessantly and he thinks that's what may have set up the wound. He has been using various topical antibiotics and ointments without any effect. He works in a [employer]ing [employer] and is on his feet all day. He does not have a prior history of wounds however he does have the rash on both lower  legs the right arm and the ventral aspect of his left arm. These are excoriations and clearly have had scratching however there are of macular looking areas on both legs including a substantial larger area on the right leg. This does not have an underlying open area. There is no blistering. The patient tells me that 2 years ago in [place] in response to the rash on his legs he saw a dermatologist who told him he had a condition which may be pyoderma gangrenosum although I may be putting words into his mouth. He seemed to recognize this. 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 He is not in any treatment for this. He's had no recent travel 12/11/16; the patient arrives today with his wound and roughly the same condition we've been using silver alginate this is a deep punched out wound with some surrounding erythema but no tenderness. Biopsy I did did not show confirmed pyoderma gangrenosum suggested nonspecific inflammation and vasculitis but  does not provide an actual description of what was seen by the pathologist. I'm really not able to understand this We have also received information from the patient's dermatologist in [place] notes from April 2016. This was a doctor Agarwal- antal. 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 He was prescribed topical steroid high potency under occlusion which helped but at this point the patient did not have a deep punched out wound. 12/18/16; the patient's wound is larger in terms of surface area however this surface looks better and there is less depth. The surrounding erythema also is better. The patient states that the wrap we put on came off 2 days ago when he has been using his compression stockings. He we are in the process of getting a dermatology consult. 12/26/16 on evaluation today patient's left lower extremity wound shows evidence of infection with surrounding erythema noted. He has been tolerating  the dressing changes but states that he has noted more discomfort. There is a larger area of erythema surrounding the wound. No fevers, chills, nausea, or vomiting noted at this time. With that being said the wound still does have slough covering the surface. He is not allergic to any medication that he is aware of at this point. In regard to his right lower extremity he had several regions that are erythematous and pruritic he wonders if there's anything we can do to help that. 01/02/17 I reviewed patient's wound culture which was obtained his visit last week. He was placed on doxycycline at that point. Unfortunately that does not appear to be an antibiotic that would likely help with the situation however the pseudomonas noted on culture is sensitive to Cipro. Also unfortunately patient's wound seems to have a large compared to last week's evaluation. Not severely so but there are definitely increased measurements in general. He is continuing to have discomfort as well he writes this to be a seven out of 10. In fact he would prefer me not to perform any debridement today due to the fact that he is having discomfort and considering he has an active infection on the little reluctant to do so anyway. No fevers, [name], [name] ([id number]) chills, nausea, or vomiting noted at this time. 01/08/17; patient seems dermatology on September 5. I suspect dermatology will want the slides from the biopsy I did sent to their pathologist. I'm not sure if there is a way we can expedite that. In any case the culture I did before I left on vacation 3 weeks ago showed Pseudomonas he was given 10 days of Cipro and per her description of her intake nurses is actually somewhat better this week although the wound is quite a bit bigger than I remember the last time I saw this. He still has 3 more days of Cipro 01/21/17; dermatology appointment tomorrow. He has completed the ciprofloxacin for Pseudomonas. Surface of the  wound looks better however he is had some deterioration in the lesions on his right leg. Meantime the left lateral leg wound we will continue with sample 01/29/17; patient had his dermatology appointment but I can't yet see that note. He is completed his antibiotics. The wound is more superficial but considerably larger in circumferential area than when he came in. This is in his left lateral calf. He also has swollen erythematous areas with superficial wounds on the right leg and small papular areas on both arms. There apparently areas in her his upper thighs and buttocks I did not look at those. Dermatology  biopsied the right leg. Hopefully will have their input next week. 02/05/17; patient went back to see his dermatologist who told him that he had a "scratching problem" as well as staph. He is now on a 30 day course of doxycycline and I believe she gave him triamcinolone cream to the right leg areas to help with the itching [not exactly sure but probably triamcinolone]. She apparently looked at the left lateral leg wound although this was not rebiopsied and I think felt to be ultimately part of the same pathogenesis. He is using sample border foam and changing nevus himself. He now has a new open area on the right posterior leg which was his biopsy site I don't have any of the dermatology notes 02/12/17; we put the patient in compression last week with SANTYL to the wound on the left leg and the biopsy. Edema is much better and the depth of the wound is now at level of skin. Area is still the same Biopsy site on the right lateral leg we've also been using santyl with a border foam dressing and he is changing this himself. 02/19/17; Using silver alginate started last week to both the substantial left leg wound and the biopsy site on the right wound. He is tolerating compression well. Has a an appointment with his primary M.D. tomorrow wondering about diuretics although I'm wondering if the edema  problem is actually lymphedema 02/26/17; the patient has been to see his primary doctor Dr. Jerrel Ivory at [facility]. She started him on Lasix 20 mg and this seems to have helped with the edema. However we are not making substantial change with the left lateral calf wound and inflammation. The biopsy site on the right leg also looks stable but not really all that different. 03/12/17; the patient has been to see vein and vascular Dr. Lucky Cowboy. He has had venous reflux studies I have not reviewed these. I did get a call from his dermatology office. 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 They therefore recommended clobetasol and tetracycline i.e. 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 03/26/17; apparently the patient just had reflux studies not an appointment with Dr. dew. She arrives in clinic today having applied clobetasol for 2-3 weeks. He notes over the last 2-3 days excessive drainage having to change the dressing 3-4 times a day and also expanding erythema. 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 Lillia Carmel at [facility] dermatology locally who kindly saw him at my request. 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 My concern was the degree of erythema medially extending from his left leg wound which was either inflammation from pyoderma or cellulitis. I put him on Augmentin however culture  of the wound showed Pseudomonas which is quinolone sensitive. I really don't believe he has cellulitis however in view of everything I will continue and give him a course of Cipro. He is also on doxycycline  as an immune modulator for the pyoderma. In addition to his original wound on the left lateral leg with surrounding erythema he has a wound on the right posterior calf which was an original biopsy site done by dermatology. 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 Saw Dr. Lillia Carmel yesterday. He has been using topical antibiotics to both wound areas his original wound on the left and the biopsies/pathergy area on the right. There is definitely some improvement in the inflammation around the wound on the right although the patient states he has increasing sensitivity of the wounds. He is on prednisone 60 and doxycycline 1 as prescribed by Dr. Lillia Carmel. He is covering the topical antibiotic with gauze and putting this in his own compression stocks and changing this daily. He states that Dr. Lottie Rater did a culture of the left leg wound yesterday 05/07/17; pyoderma gangrenosum. The patient saw Dr. Lillia Carmel yesterday and has a follow-up with her in one month. He is still using topical antibiotics to both wounds although he can't recall exactly what type. He is still on prednisone 60 mg. Dr. Lillia Carmel stated that the doxycycline could stop if we were in agreement. 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 The right medial leg was induced by [name] ([id number]) biopsy/pathergy. The area on the right is essentially healed. 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 The right medial leg has closed and remains closed. He is still on prednisone  60. He tells me he missed his last dermatology appointment with Dr. Lillia Carmel but will make another appointment. He reports that her blood sugar at a recent screen in [place] was high 200's. He was 180 today. 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 I think he is going to require still much longer prednisone perhaps another 3 months before attempting to taper. In the meantime his wound is a lot better. Smaller. He is cleaning this off daily and applying topical antibiotics. When he was last in the clinic I thought about changing to [facility] and actually put in a couple of calls to dermatology although probably not during their business hours. 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 He continues on oral prednisone. He has been using triple antibiotic ointment. There is surface debris and we will transition to [facility] and have him return in 2 weeks. He has lost 30 pounds since his last appointment with lifestyle modification. He may benefit from topical steroid cream for treatment this can be considered at a later date. 08/22/17 on evaluation today patient appears to actually be doing rather well in regard to his left lateral lower extremity ulcer. He has actually been managed by Dr. Dellia Nims most recently. Patient is currently on oral steroids at this time. This seems to have been of benefit for him. Nonetheless his last visit was actually with Leah on 08/06/17. Currently he is not utilizing any topical steroid creams although this could be of benefit as well. No fevers, chills, nausea, or vomiting noted at this time. 09/05/17 on evaluation today patient appears to be doing better in regard to his left lateral lower extremity ulcer. He has been tolerating the dressing  changes without complication. He is using Santyl with good effect. Overall I'm very pleased with  how things are standing at this point. Patient likewise is happy that this is doing better. 09/19/17 on evaluation today patient actually appears to be doing rather well in regard to his left lateral lower extremity ulcer. 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 He's not having any significant pain. 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 He has been tolerating the Santyl without complication and in general I feel like he's making good progress. 10/17/17 on evaluation today patient appears to be doing very well in regard to his left lateral lower surety ulcer. He has been tolerating the dressing changes without complication. There does not appear to be any evidence of infection he's alternating the Santyl and the triple antibiotic ointment every other day this seems to be doing well for him. 11/03/17 on evaluation today patient appears to be doing very well in regard to his left lateral lower extremity ulcer. He is been tolerating the dressing changes without complication which is good news. Fortunately there does not appear to be any evidence of infection which is also great news. Overall is doing excellent they are starting to taper down on the prednisone is down to 40 mg at this point it also started topical clobetasol for him. 11/17/17 on evaluation today patient appears to be doing well in regard to his left lateral lower surety ulcer. He's been tolerating the dressing changes without complication. He does note that he is having no pain, no excessive drainage or discharge, and overall he feels like things are going about how he would expect and hope they would. Overall he seems to have no evidence of infection at this time in my opinion which is good news. Patient History Information obtained from  Patient. Family History Diabetes - Mother,Maternal Grandparents, Heart Disease - Mother,Maternal Grandparents, Hereditary Spherocytosis - Siblings, Hypertension - Maternal Grandparents, Stroke - Siblings, No family history of Cancer, Kidney Disease, Lung Disease, Seizures, Thyroid Problems, Tuberculosis. Social History Current some day smoker, Marital Status - Married, Alcohol Use - Rarely, Drug Use - No History, Caffeine Use - Moderate. Review of Systems (ROS) Constitutional Symptoms (General Health) Figge, Derion ([id number]) Denies complaints or symptoms of Fever, Chills. Respiratory The patient has no complaints or symptoms. Cardiovascular Complains or has symptoms of LE edema. Psychiatric The patient has no complaints or symptoms. Objective Constitutional Chronically ill appearing but in no apparent acute distress. Vitals Time Taken: 8:08 AM, Height: 71 in, Weight: 338 lbs, BMI: 47.1, Temperature: 98.5 F, Pulse: 90 bpm, Respiratory Rate: 18 breaths/min, Blood Pressure: 143/93 mmHg. Respiratory normal breathing without difficulty. Cardiovascular 1+ pitting edema of the bilateral lower extremities. Psychiatric this patient is able to make decisions and demonstrates good insight into disease process. Alert and Oriented x 3. pleasant and cooperative. General Notes: Patient's wound bed shows evidence of good granulation there is some Slough noted but fortunately nothing too significant at this point. He does not appear to be showing any signs of infection at this point which is good news. There still erythema surrounding the wound but this is more of the chronic type which seems to in my pinion actually be fading around the edges but nonetheless she still has some issues at this point. Overall I'm pleased with the progress  he seems to be making. Integumentary (Hair, Skin) Wound #1 status is Open. Original cause of wound was Gradually Appeared. The wound is located on the  Left,Lateral Lower Leg. The wound measures 0.6cm length x 2cm width x 0.1cm depth; 0.942cm^2 area and 0.094cm^3 volume. There is Fat Layer (Subcutaneous Tissue) Exposed exposed. There is no tunneling or undermining noted. There is a large amount of serous drainage noted. The wound margin is distinct with the outline attached to the wound base. There is no granulation within the wound bed. There is a large (67-100%) amount of necrotic tissue within the wound bed including Adherent Slough. The periwound skin appearance exhibited: Induration, Hemosiderin Staining, Erythema. The periwound skin appearance did not exhibit: Callus, Crepitus, Excoriation, Rash, Scarring, Dry/Scaly, Maceration, Atrophie Blanche, Cyanosis, Ecchymosis, Mottled, Pallor, Rubor. The surrounding wound skin color is noted with erythema which is circumferential. Periwound temperature was noted as No Abnormality. The periwound has tenderness on palpation. 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 Anesthetic (add to Medication List): Wound #1 Left,Lateral Lower Leg: Topical Lidocaine 4% cream applied to wound bed prior to debridement (In Clinic Only). Skin Barriers/Peri-Wound Care: Wound #1 Left,Lateral Lower Leg: Triamcinolone Acetonide Ointment (TCA) Secondary Dressing: Wound #1 Left,Lateral Lower Leg: Boardered Foam Dressing Dressing Change Frequency: Wound #1 Left,Lateral Lower Leg: Change dressing every day. Follow-up Appointments: Wound #1 Left,Lateral Lower Leg: Return Appointment in 2 weeks. Edema Control: Wound #1 Left,Lateral Lower Leg: Patient to wear own compression stockings I am going to suggest at this point that we continue with the Current wound care measures for the  next week. Patient is in agreement the plan. We will subsequently see were things stand at follow-up in two weeks time. If anything changes in the interim he will contact the office for additional recommendations. Please see above for specific wound care orders. We will see patient for re-evaluation in 1 week(s) here in the clinic. If anything worsens or changes patient will contact our office for additional recommendations. Electronic Signature(s) Signed: 11/18/2017 8:11:30 AM By: Worthy Keeler PA-C Entered By: Worthy Keeler on 11/17/2017 08:55:19 [name], [name] ([id number]) -------------------------------------------------------------------------------- ROS/PFSH Details Patient Name: [name] Date of Service: 11/17/2017 8:00 AM Medical Record Number: [id number] Patient Account Number: [id number] Date of Birth/Sex: Oct 27, 1980 (36 y.o. M) Treating RN: Roger Shelter Primary Care Provider: [name] Other Clinician: Referring Provider: [name] Treating Provider/Extender: Melburn Hake, Koraline Phillipson Weeks in Treatment: 30 Information Obtained From Patient Wound History Do you currently have one or more open woundso Yes How many open wounds do you currently haveo 1 Approximately how long have you had your woundso 1 year How have you been treating your wound(s) until nowo neosporin Has your wound(s) ever healed and then re-openedo No Have you had any lab work done in the past montho No Have you tested positive for an antibiotic resistant organism (MRSA, VRE)o No Have you tested positive for osteomyelitis (bone infection)o No Have you had any tests for circulation on your legso No Have you had other problems associated with your woundso Infection, Swelling Constitutional Symptoms (General Health) Complaints and Symptoms: Negative for: Fever; Chills Cardiovascular Complaints and Symptoms: Positive for: LE edema Medical History: Positive for: Hypertension Negative for: Angina;  Arrhythmia; Congestive Heart Failure; Coronary Artery Disease; Deep Vein Thrombosis; Hypotension; Myocardial Infarction; Peripheral Arterial Disease; Peripheral Venous Disease;  Phlebitis; Vasculitis Eyes Medical History: Negative for: Cataracts; Glaucoma; Optic Neuritis Ear/Nose/Mouth/Throat Medical History: Negative for: Chronic sinus problems/congestion; Middle ear problems Hematologic/Lymphatic Medical History: Negative for: Anemia; Hemophilia; Human Immunodeficiency Virus; Lymphedema; Sickle Cell Disease Respiratory Complaints and Symptoms: No Complaints or Symptoms Medical HistoryNIKOLI, [name] ([id number]) Positive for: Sleep Apnea - cpap Negative for: Aspiration; Asthma; Chronic Obstructive Pulmonary Disease (COPD); Pneumothorax Gastrointestinal Medical History: Positive for: Colitis Endocrine Medical History: Negative for: Type I Diabetes; Type II Diabetes Genitourinary Medical History: Negative for: End Stage Renal Disease Immunological Medical History: Negative for: Lupus Erythematosus; Raynaudos; Scleroderma Integumentary (Skin) Medical History: Negative for: History of Burn; History of pressure wounds Musculoskeletal Medical History: Negative for: Gout; Rheumatoid Arthritis; Osteoarthritis; Osteomyelitis Neurologic Medical History: Negative for: Dementia; Neuropathy; Quadriplegia; Paraplegia; Seizure Disorder Oncologic Medical History: Negative for: Received Chemotherapy; Received Radiation Psychiatric Complaints and Symptoms: No Complaints or Symptoms Medical History: Negative for: Anorexia/bulimia; Confinement Anxiety Immunizations Pneumococcal Vaccine: Received Pneumococcal Vaccination: No Implantable Devices Family and Social History DEMECO, DUCKSWORTH ([id number]) Cancer: No; Diabetes: Yes - Mother,Maternal Grandparents; Heart Disease: Yes - Mother,Maternal Grandparents; Hereditary Spherocytosis: Yes - Siblings; Hypertension: Yes - Maternal  Grandparents; Kidney Disease: No; Lung Disease: No; Seizures: No; Stroke: Yes - Siblings; Thyroid Problems: No; Tuberculosis: No; Current some day smoker; Marital Status - Married; Alcohol Use: Rarely; Drug Use: No History; Caffeine Use: Moderate; Financial Concerns: No; Food, Clothing or Shelter Needs: No; Support System Lacking: No; Transportation Concerns: No; Advanced Directives: No; Patient does not want information on Advanced Directives; Living Will: No Physician Affirmation I have reviewed and agree with the above information. Electronic Signature(s) Signed: 11/18/2017 8:11:30 AM By: Worthy Keeler PA-C Signed: 11/19/2017 3:34:51 PM By: Roger Shelter Entered By: Worthy Keeler on 11/17/2017 08:54:00 [name] ([id number]) -------------------------------------------------------------------------------- SuperBill Details Patient Name: [name] Date of Service: 11/17/2017 Medical Record Number: [id number] Patient Account Number: [id number] Date of Birth/Sex: June 08, 1980 (36 y.o. M) Treating RN: Roger Shelter Primary Care Provider: [name] Other Clinician: Referring Provider: [name] Treating Provider/Extender: Melburn Hake, Danyah Guastella Weeks in Treatment: 66 Diagnosis Coding ICD-10 Codes Code Description [phone] 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sis of muscle L88 Pyoderma gangrenosum I87.2 Venous insufficiency (chronic) (peripheral) Facility Procedures CPT4 Code: [id number] Description: [phone] - WOUND CARE VISIT-LEV 2 EST PT Modifier: Quantity: 1 Physician Procedures CPT4 Code: [id number] Description: 67341 - WC PHYS LEVEL 3 - EST PT ICD-10 Diagnosis Description L97.223 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sis of m L88 Pyoderma gangrenosum I87.2 Venous insufficiency (chronic) (peripheral) Modifier: uscle Quantity: 1 Electronic Signature(s) Signed: 11/18/2017 8:11:30 AM By: Worthy Keeler PA-C Entered By: Worthy Keeler on 11/17/2017  08:58:15

## 2017-11-20 NOTE — Progress Notes (Signed)
KATHY, WARES ([id number]) Visit Report for 11/17/2017 Arrival Information Details Patient Name: [name], [name] Date of Service: 11/17/2017 8:00 AM Medical Record Number: [id number] Patient Account Number: [id number] Date of Birth/Sex: May 30, 1980 (37 y.o. M) Treating RN: Ahmed Prima Primary Care Abdon Petrosky: [name] Other Clinician: Referring Joan Avetisyan: [name] Treating Shirlean Berman/Extender: Melburn Hake, [name]: 2 Visit Information History Since Last Visit All ordered tests and consults were completed: No Patient Arrived: Ambulatory Added or deleted any medications: No Arrival Time: 08:07 Any new allergies or adverse reactions: No Accompanied By: self Had a fall or experienced change in No Transfer Assistance: None activities of daily living that may affect Patient Identification Verified: Yes risk of falls: Secondary Verification Process Completed: Yes Signs or symptoms of abuse/neglect since last visito No Patient Requires Transmission-Based No Hospitalized since last visit: No Precautions: Implantable device outside of the clinic excluding No Patient Has Alerts: No cellular tissue based products placed in the center since last visit: Has Dressing in Place as Prescribed: Yes Has Compression in Place as Prescribed: Yes Pain Present Now: No Electronic Signature(s) Signed: 11/17/2017 5:11:46 PM By: Alric Quan Entered By: Alric Quan on 11/17/2017 08:08:00 [name] ([id number]) -------------------------------------------------------------------------------- Clinic Level of Care Assessment Details Patient Name: [name] Date of Service: 11/17/2017 8:00 AM Medical Record Number: [id number] Patient Account Number: [id number] Date of Birth/Sex: 1981/04/15 (36 y.o. M) Treating RN: Roger Shelter Primary Care Jeriann Sayres: [name] Other Clinician: Referring Madalina Rosman: [name] Treating Epifanio Labrador/Extender: Melburn Hake, HOYT Weeks  in Treatment: 81 Clinic Level of Care Assessment Items TOOL 4 Quantity Score X - Use when only an EandM is performed on FOLLOW-UP visit 1 0 ASSESSMENTS - Nursing Assessment / Reassessment X - Reassessment of Co-morbidities (includes updates in patient status) 1 10 X- 1 5 Reassessment of Adherence to Treatment Plan ASSESSMENTS - Wound and Skin Assessment / Reassessment X - Simple Wound Assessment / Reassessment - one wound 1 5 []  - 0 Complex Wound Assessment / Reassessment - multiple wounds []  - 0 Dermatologic / Skin Assessment (not related to wound area) ASSESSMENTS - Focused Assessment []  - Circumferential Edema Measurements - multi extremities 0 []  - 0 Nutritional Assessment / Counseling / Intervention []  - 0 Lower Extremity Assessment (monofilament, tuning fork, pulses) []  - 0 Peripheral Arterial Disease Assessment (using hand held doppler) ASSESSMENTS - Ostomy and/or Continence Assessment and Care []  - Incontinence Assessment and Management 0 []  - 0 Ostomy Care Assessment and Management (repouching, etc.) PROCESS - Coordination of Care X - Simple Patient / Family Education for ongoing care 1 15 []  - 0 Complex (extensive) Patient / Family Education for ongoing care []  - 0 Staff obtains Programmer, systems, Records, Test Results / Process Orders []  - 0 Staff telephones HHA, Nursing Homes / Clarify orders / etc []  - 0 Routine Transfer to another Facility (non-emergent condition) []  - 0 Routine Hospital Admission (non-emergent condition) []  - 0 New Admissions / Biomedical engineer / Ordering NPWT, Apligraf, etc. []  - 0 Emergency Hospital Admission (emergent condition) X- 1 10 Simple Discharge Coordination Velador, Blayde ([id number]) []  - 0 Complex (extensive) Discharge Coordination PROCESS - Special Needs []  - Pediatric / Minor Patient Management 0 []  - 0 Isolation Patient Management []  - 0 Hearing / Language / Visual special needs []  - 0 Assessment of Community  assistance (transportation, D/C planning, etc.) []  - 0 Additional assistance / Altered mentation []  - 0 Support Surface(s) Assessment (bed, cushion, seat, etc.) INTERVENTIONS - Wound Cleansing / Measurement X - Simple Wound  Cleansing - one wound 1 5 []  - 0 Complex Wound Cleansing - multiple wounds X- 1 5 Wound Imaging (photographs - any number of wounds) []  - 0 Wound Tracing (instead of photographs) X- 1 5 Simple Wound Measurement - one wound []  - 0 Complex Wound Measurement - multiple wounds INTERVENTIONS - Wound Dressings X - Small Wound Dressing one or multiple wounds 1 10 []  - 0 Medium Wound Dressing one or multiple wounds []  - 0 Large Wound Dressing one or multiple wounds []  - 0 Application of Medications - topical []  - 0 Application of Medications - injection INTERVENTIONS - Miscellaneous []  - External ear exam 0 []  - 0 Specimen Collection (cultures, biopsies, blood, body fluids, etc.) []  - 0 Specimen(s) / Culture(s) sent or taken to Lab for analysis []  - 0 Patient Transfer (multiple staff / Civil Service fast streamer / Similar devices) []  - 0 Simple Staple / Suture removal (25 or less) []  - 0 Complex Staple / Suture removal (26 or more) []  - 0 Hypo / Hyperglycemic Management (close monitor of Blood Glucose) []  - 0 Ankle / Brachial Index (ABI) - do not check if billed separately X- 1 5 Vital Signs Valenza, Xander ([id number]) Has the patient been seen at the hospital within the last three years: Yes Total Score: 75 Level Of Care: New/Established - Level 2 Electronic Signature(s) Signed: 11/19/2017 3:34:51 PM By: Roger Shelter Entered By: Roger Shelter on 11/17/2017 08:27:58 [name] ([id number]) -------------------------------------------------------------------------------- Encounter Discharge Information Details Patient Name: [name] Date of Service: 11/17/2017 8:00 AM Medical Record Number: [id number] Patient Account Number: [id number] Date of Birth/Sex:  [birth year] (36 y.o. M) Treating RN: Roger Shelter Primary Care Keshauna Degraffenreid: [name] Other Clinician: Referring Jullisa Grigoryan: [name] Treating Dierks Wach/Extender: Melburn Hake, [name]: 49 Encounter Discharge Information Items Discharge Condition: Stable Ambulatory Status: Ambulatory Discharge Destination: Home Transportation: Private Auto Schedule Follow-up Appointment: Yes Clinical Summary of Care: Electronic Signature(s) Signed: 11/19/2017 3:34:51 PM By: Roger Shelter Entered By: Roger Shelter on 11/17/2017 08:32:07 [name] ([id number]) -------------------------------------------------------------------------------- Lower Extremity Assessment Details Patient Name: [name] Date of Service: 11/17/2017 8:00 AM Medical Record Number: [id number] Patient Account Number: [id number] Date of Birth/Sex: November 28, 1980 (36 y.o. M) Treating RN: Ahmed Prima Primary Care Loxley Schmale: [name] Other Clinician: Referring Ladarrian Asencio: [name] Treating Yohan Samons/Extender: Melburn Hake, [name]: 60 Vascular Assessment Pulses: Dorsalis Pedis Palpable: [Left:Yes] Posterior Tibial Extremity colors, hair growth, and conditions: Extremity Color: [Left:Hyperpigmented] Temperature of Extremity: [Left:Warm] Capillary Refill: [Left:< 3 seconds] Toe Nail Assessment Left: Right: Thick: Yes Discolored: Yes Deformed: No Improper Length and Hygiene: No Electronic Signature(s) Signed: 11/17/2017 5:11:46 PM By: Alric Quan Entered By: Alric Quan on 11/17/2017 Spencer, Lestat ([id number]) -------------------------------------------------------------------------------- Multi Wound Chart Details Patient Name: [name] Date of Service: 11/17/2017 8:00 AM Medical Record Number: [id number] Patient Account Number: [id number] Date of Birth/Sex: [birth year] (36 y.o. 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] [N/A:N/A] Comorbid History: [1:Sleep Apnea, Hypertension, Colitis] [N/A:N/A] Date Acquired: [1:11/18/2015] [N/A:N/A] Weeks of Treatment: [1:49] [N/A:N/A] Wound Status: [1:Open] [N/A:N/A] Measurements L x W x D [1:0.6x2x0.1] [N/A:N/A] (cm) Area (cm) : [1:0.942] [N/A:N/A] Volume (cm) : [1:0.094] [N/A:N/A] %  Reduction in Area: [1:80.80%] [N/A:N/A] % Reduction in Volume: [1:97.60%] [N/A:N/A] Classification: [1:Full Thickness Without Exposed Support Structures] [N/A:N/A] Exudate Amount: [1:Large] [N/A:N/A] Exudate Type: [1:Serous] [N/A:N/A] Exudate Color: [1:amber] [N/A:N/A] Wound Margin: [1:Distinct, outline attached] [N/A:N/A] Granulation Amount: [1:None Present (0%)] [N/A:N/A] Necrotic Amount: [1:Large (67-100%)] [N/A:N/A] Exposed Structures: [1:Fat Layer (Subcutaneous Tissue) Exposed: Yes Fascia: No Tendon: No Muscle: No Joint: No Bone: No] [N/A:N/A] Epithelialization: [1:Medium (34-66%)] [N/A:N/A] Periwound Skin Texture: [1:Induration: Yes Excoriation: No Callus: No Crepitus: No Rash: No Scarring: No] [N/A:N/A] Periwound Skin Moisture: Maceration: No N/A N/A Dry/Scaly: No Periwound Skin Color: Erythema: Yes N/A N/A Hemosiderin Staining: Yes Atrophie Blanche: No Cyanosis: No Ecchymosis: No Mottled: No Pallor: No Rubor: No Erythema Location: Circumferential N/A N/A Erythema Change: Increased N/A N/A Temperature: No Abnormality N/A N/A Tenderness on Palpation: Yes N/A N/A Wound Preparation: Ulcer  Cleansing: Wound N/A N/A Cleanser Topical Anesthetic Applied: Other: Lidocaine 4% Treatment Notes Electronic Signature(s) Signed: 11/19/2017 3:34:51 PM By: Roger Shelter Entered By: Roger Shelter on 11/17/2017 08:21:16 AYIDEN, MILLIMAN ([id number]) -------------------------------------------------------------------------------- [place] Details Patient Name: [name] Date of Service: 11/17/2017 8:00 AM Medical Record Number: [id number] Patient Account Number: [id number] Date of Birth/Sex: 09/26/1980 (36 y.o. M) Treating RN: Roger Shelter Primary Care Ewen Varnell: [name] Other Clinician: Referring Amar Sippel: [name] Treating Destaney Sarkis/Extender: Melburn Hake, [name]: 39 Active Inactive ` Orientation to the Wound Care Program Nursing Diagnoses: Knowledge deficit related to the wound healing center program Goals: Patient/caregiver will verbalize understanding of the [facility] Program Date Initiated: 12/11/2016 Target Resolution Date: 03/13/2017 Goal Status: Active Interventions: Provide education on orientation to the wound center Notes: ` Venous Leg Ulcer Nursing Diagnoses: Knowledge deficit related to disease process and management Potential for venous Insuffiency (use before diagnosis confirmed) Goals: Non-invasive venous studies are completed as ordered Date Initiated: 12/11/2016 Target Resolution Date: 03/13/2017 Goal Status: Active Patient will maintain optimal edema control Date Initiated: 12/11/2016 Target Resolution Date: 03/13/2017 Goal Status: Active Patient/caregiver will verbalize understanding of disease process and disease management Date Initiated: 12/11/2016 Target Resolution Date: 03/13/2017 Goal Status: Active Verify adequate tissue perfusion prior to therapeutic compression application Date Initiated: 12/11/2016 Target Resolution Date: 03/13/2017 Goal Status: Active Interventions: Assess  peripheral edema status every visit. Compression as ordered Provide education on venous insufficiency Treatment Activities: EDVIN, [name] ([id number]) Therapeutic compression applied : 12/11/2016 Notes: ` Wound/Skin Impairment Nursing Diagnoses: Impaired tissue integrity Knowledge deficit related to ulceration/compromised skin integrity Goals: Patient/caregiver will verbalize understanding of skin care regimen Date Initiated: 12/11/2016 Target Resolution Date: 03/13/2017 Goal Status: Active Ulcer/skin breakdown will have a volume reduction of 30% by week 4 Date Initiated: 12/11/2016 Target Resolution Date: 03/13/2017 Goal Status: Active Ulcer/skin breakdown will have a volume reduction of 50% by week 8 Date Initiated: 12/11/2016 Target Resolution Date: 03/13/2017 Goal Status: Active Ulcer/skin breakdown will have a volume reduction of 80% by week 12 Date Initiated: 12/11/2016 Target Resolution Date: 03/13/2017 Goal Status: Active Ulcer/skin breakdown will heal within 14 weeks Date Initiated: 12/11/2016 Target Resolution Date: 03/13/2017 Goal Status: Active Interventions: Assess patient/caregiver ability to obtain necessary supplies Assess patient/caregiver ability to perform ulcer/skin care regimen upon admission and as needed Assess ulceration(s) every visit Provide education on ulcer and skin care Treatment Activities: Skin care regimen initiated : 12/11/2016 Topical wound management initiated : 12/11/2016 Notes: Electronic Signature(s) Signed: 11/19/2017 3:34:51 PM By: Roger Shelter Entered By: Roger Shelter on 11/17/2017 08:21:03 [name] ([id number]) -------------------------------------------------------------------------------- Pain Assessment Details Patient Name: [name] Date of Service: 11/17/2017 8:00 AM  Medical Record Number: [id number] Patient Account Number: [id number] Date of Birth/Sex: [birth year] (36 y.o. M) Treating RN: Ahmed Prima Primary  Care Raela Bohl: [name] Other Clinician: Referring Nubia Ziesmer: [name] Treating Jaquann Guarisco/Extender: Melburn Hake, [name]: 40 Active Problems Location of Pain Severity and Description of Pain Patient Has Paino No Site Locations Pain Management and Medication Current Pain Management: Electronic Signature(s) Signed: 11/17/2017 5:11:46 PM By: Alric Quan Entered By: Alric Quan on 11/17/2017 08:08:07 [name] ([id number]) -------------------------------------------------------------------------------- Patient/Caregiver Education Details Patient Name: [name] Date of Service: 11/17/2017 8:00 AM Medical Record Number: [id number] Patient Account Number: [id number] Date of Birth/Gender: 1981-02-14 (36 y.o. M) Treating RN: Roger Shelter Primary Care Physician: [name] Other Clinician: Referring Physician: [name] Treating Physician/Extender: Sharalyn Ink in Treatment: 56 Education Assessment Education Provided To: Patient Education Topics Provided Wound/Skin Impairment: Handouts: Caring for Your Ulcer Methods: Explain/Verbal Responses: State content correctly Electronic Signature(s) Signed: 11/19/2017 3:34:51 PM By: Roger Shelter Entered By: Roger Shelter on 11/17/2017 08:32:25 [name] ([id number]) -------------------------------------------------------------------------------- Wound Assessment Details Patient Name: [name] Date of Service: 11/17/2017 8:00 AM Medical Record Number: [id number] Patient Account Number: [id number] Date of Birth/Sex: 01-Jul-1980 (36 y.o. M) Treating RN: Ahmed Prima Primary Care Shaney Deckman: [name] Other Clinician: Referring Ambrie Carte: [name] Treating [place] Kuang/Extender: Melburn Hake, [name]: 47 Wound Status Wound Number: 1 Primary Etiology: Pyoderma Wound Location: Left Lower Leg - Lateral Wound Status: Open Wounding Event: Gradually  Appeared Comorbid History: Sleep Apnea, Hypertension, Colitis Date Acquired: 11/18/2015 Weeks Of Treatment: 49 Clustered Wound: No Photos Photo Uploaded By: [name], [name], RN, [name], [name] on 11/17/2017 14:34:27 Wound Measurements Length: (cm) 0.6 Width: (cm) 2 Depth: (cm) 0.1 Area: (cm) 0.942 Volume: (cm) 0.094 % Reduction in Area: 80.8% % Reduction in Volume: 97.6% Epithelialization: Medium (34-66%) Tunneling: No Undermining: No Wound Description Full Thickness Without Exposed Support Foul Odo Classification: Structures Slough/F Wound Margin: Distinct, outline attached Exudate Large Amount: Exudate Type: Serous Exudate Color: amber r After Cleansing: No ibrino Yes Wound Bed Granulation Amount: None Present (0%) Exposed Structure Necrotic Amount: Large (67-100%) Fascia Exposed: No Necrotic Quality: Adherent Slough Fat Layer (Subcutaneous Tissue) Exposed: Yes Tendon Exposed: No Muscle Exposed: No Joint Exposed: No Bone Exposed: No Zackery, Kairos ([id number]) Periwound Skin Texture Texture Color No Abnormalities Noted: No No Abnormalities Noted: No Callus: No Atrophie Blanche: No Crepitus: No Cyanosis: No Excoriation: No Ecchymosis: No Induration: Yes Erythema: Yes Rash: No Erythema Location: Circumferential Scarring: No Erythema Change: Increased Hemosiderin Staining: Yes Moisture Mottled: No No Abnormalities Noted: No Pallor: No Dry / Scaly: No Rubor: No Maceration: No Temperature / Pain Temperature: No Abnormality Tenderness on Palpation: Yes Wound Preparation Ulcer Cleansing: Wound Cleanser Topical Anesthetic Applied: Other: Lidocaine 4%, Treatment Notes Wound #1 (Left, Lateral Lower Leg) 1. Cleansed with: Clean wound with Normal Saline 2. Anesthetic Topical Lidocaine 4% cream to wound bed prior to debridement Notes TCA cream and BF dressing Electronic Signature(s) Signed: 11/17/2017 5:11:46 PM By: Alric Quan Entered By: Alric Quan on 11/17/2017 08:15:01 [name] ([id number]) -------------------------------------------------------------------------------- [place] Details Patient Name: [name] Date of Service: 11/17/2017 8:00 AM Medical Record Number: [id number] Patient Account Number: [id number] Date of Birth/Sex: 1981/01/06 (36 y.o. M) Treating RN: Ahmed Prima Primary Care Lajuanna Pompa: [name] Other Clinician: Referring Roshini Fulwider: [name] Treating Ostin Mathey/Extender: Melburn Hake, [name]: 51 Vital Signs Time Taken: 08:08 Temperature (F): 98.5 Height (in): 71 Pulse (bpm): 90 Weight (lbs): 338 Respiratory Rate (breaths/min): 18 Body Mass Index (BMI):  47.1 Blood Pressure (mmHg): 143/93 Reference Range: 80 - 120 mg / dl Electronic Signature(s) Signed: 11/17/2017 5:11:46 PM By: Alric Quan Entered By: Alric Quan on 11/17/2017 08:10:49

## 2017-11-26 ENCOUNTER — Ambulatory Visit
Admit: 2017-11-26 | Discharge: 2017-11-27 | Payer: PRIVATE HEALTH INSURANCE | Attending: Dermatology | Primary: Dermatology

## 2017-11-26 DIAGNOSIS — L88 Pyoderma gangrenosum: Principal | ICD-10-CM

## 2017-11-26 DIAGNOSIS — L858 Other specified epidermal thickening: Secondary | ICD-10-CM

## 2017-11-26 DIAGNOSIS — L853 Xerosis cutis: Secondary | ICD-10-CM

## 2017-11-26 DIAGNOSIS — B079 Viral wart, unspecified: Secondary | ICD-10-CM

## 2017-11-26 MED ORDER — PREDNISONE 10 MG TABLET
ORAL_TABLET | 0 refills | 0 days | Status: CP
Start: 2017-11-26 — End: 2018-03-18

## 2017-12-01 ENCOUNTER — Ambulatory Visit: Payer: 59 | Admitting: Physician Assistant

## 2017-12-04 ENCOUNTER — Encounter: Payer: 59 | Admitting: Nurse Practitioner

## 2017-12-04 DIAGNOSIS — L97222 Non-pressure chronic ulcer of left calf with fat layer exposed: Secondary | ICD-10-CM | POA: Diagnosis not present

## 2017-12-04 DIAGNOSIS — L88 Pyoderma gangrenosum: Secondary | ICD-10-CM | POA: Diagnosis not present

## 2017-12-05 ENCOUNTER — Ambulatory Visit (INDEPENDENT_AMBULATORY_CARE_PROVIDER_SITE_OTHER): Payer: 59 | Admitting: Primary Care

## 2017-12-05 ENCOUNTER — Ambulatory Visit (INDEPENDENT_AMBULATORY_CARE_PROVIDER_SITE_OTHER)
Admission: RE | Admit: 2017-12-05 | Discharge: 2017-12-05 | Disposition: A | Discharge status: Home / Self Care | Payer: 59 | Source: Ambulatory Visit | Attending: Primary Care | Admitting: Primary Care

## 2017-12-05 ENCOUNTER — Ambulatory Visit: Admission: RE | Admit: 2017-12-05 | Payer: 59 | Source: Ambulatory Visit

## 2017-12-05 VITALS — BP 130/80 | HR 82 | Temp 98.0°F | Ht 72.0 in | Wt 355.5 lb

## 2017-12-05 DIAGNOSIS — M25572 Pain in left ankle and joints of left foot: Secondary | ICD-10-CM | POA: Diagnosis not present

## 2017-12-05 DIAGNOSIS — G8929 Other chronic pain: Secondary | ICD-10-CM | POA: Diagnosis not present

## 2017-12-05 DIAGNOSIS — M25562 Pain in left knee: Secondary | ICD-10-CM

## 2017-12-05 DIAGNOSIS — M7732 Calcaneal spur, left foot: Secondary | ICD-10-CM | POA: Diagnosis not present

## 2017-12-05 DIAGNOSIS — M1712 Unilateral primary osteoarthritis, left knee: Secondary | ICD-10-CM | POA: Diagnosis not present

## 2017-12-05 LAB — URIC ACID: Uric Acid, Serum: 5.5 mg/dL (ref 4.0–7.8)

## 2017-12-05 NOTE — Assessment & Plan Note (Signed)
Chronic for three months, no improvement. Discussed to start wearing brace/sleeve to knee during work hours. Also discussed comfortable shoes. Strongly advised weight loss through diet and exercise.  Check plain films and uric acid level today. Consider sports med vs PT evaluation.

## 2017-12-05 NOTE — Progress Notes (Signed)
SKYLEN, [name] ([id number]) Visit Report for 12/04/2017 Arrival Information Details Patient Name: [name], [name] Date of Service: 12/04/2017 8:00 AM Medical Record Number: [id number] Patient Account Number: [id number] Date of Birth/Sex: Jan 19, 1981 (37 y.o. M) Treating RN: Roger Shelter Primary Care Ronnel Zuercher: [name] Other Clinician: Referring Tymir Terral: [name] Treating Thirza Pellicano/Extender: Cathie Olden in Treatment: 61 Visit Information History Since Last Visit All ordered tests and consults were completed: No Patient Arrived: Ambulatory Added or deleted any medications: No Arrival Time: 08:07 Any new allergies or adverse reactions: No Accompanied By: self Had a fall or experienced change in No Transfer Assistance: None activities of daily living that may affect Patient Identification Verified: Yes risk of falls: Secondary Verification Process Completed: Yes Signs or symptoms of abuse/neglect since last visito No Patient Requires Transmission-Based No Hospitalized since last visit: No Precautions: Implantable device outside of the clinic excluding No Patient Has Alerts: No cellular tissue based products placed in the center since last visit: Pain Present Now: No Electronic Signature(s) Signed: 12/04/2017 3:39:18 PM By: Roger Shelter Entered By: Roger Shelter on 12/04/2017 08:08:31 [name] ([id number]) -------------------------------------------------------------------------------- Clinic Level of Care Assessment Details Patient Name: [name] Date of Service: 12/04/2017 8:00 AM Medical Record Number: [id number] Patient Account Number: [id number] Date of Birth/Sex: Jun 21, 1980 (36 y.o. M) Treating RN: Ahmed Prima Primary Care Ardyce Heyer: [name] Other Clinician: Referring Knox Cervi: [name] Treating Rhoda Waldvogel/Extender: Cathie Olden in Treatment: 23 Clinic Level of Care Assessment Items TOOL 4 Quantity Score X - Use  when only an EandM is performed on FOLLOW-UP visit 1 0 ASSESSMENTS - Nursing Assessment / Reassessment X - Reassessment of Co-morbidities (includes updates in patient status) 1 10 X- 1 5 Reassessment of Adherence to Treatment Plan ASSESSMENTS - Wound and Skin Assessment / Reassessment X - Simple Wound Assessment / Reassessment - one wound 1 5 []  - 0 Complex Wound Assessment / Reassessment - multiple wounds []  - 0 Dermatologic / Skin Assessment (not related to wound area) ASSESSMENTS - Focused Assessment []  - Circumferential Edema Measurements - multi extremities 0 []  - 0 Nutritional Assessment / Counseling / Intervention []  - 0 Lower Extremity Assessment (monofilament, tuning fork, pulses) []  - 0 Peripheral Arterial Disease Assessment (using hand held doppler) ASSESSMENTS - Ostomy and/or Continence Assessment and Care []  - Incontinence Assessment and Management 0 []  - 0 Ostomy Care Assessment and Management (repouching, etc.) PROCESS - Coordination of Care X - Simple Patient / Family Education for ongoing care 1 15 []  - 0 Complex (extensive) Patient / Family Education for ongoing care []  - 0 Staff obtains Programmer, systems, Records, Test Results / Process Orders []  - 0 Staff telephones HHA, Nursing Homes / Clarify orders / etc []  - 0 Routine Transfer to another Facility (non-emergent condition) []  - 0 Routine Hospital Admission (non-emergent condition) []  - 0 New Admissions / Biomedical engineer / Ordering NPWT, Apligraf, etc. []  - 0 Emergency Hospital Admission (emergent condition) X- 1 10 Simple Discharge Coordination [name], [name] ([id number]) []  - 0 Complex (extensive) Discharge Coordination PROCESS - Special Needs []  - Pediatric / Minor Patient Management 0 []  - 0 Isolation Patient Management []  - 0 Hearing / Language / Visual special needs []  - 0 Assessment of Community assistance (transportation, D/C planning, etc.) []  - 0 Additional assistance / Altered  mentation []  - 0 Support Surface(s) Assessment (bed, cushion, seat, etc.) INTERVENTIONS - Wound Cleansing / Measurement X - Simple Wound Cleansing - one wound 1 5 []  - 0 Complex Wound Cleansing - multiple wounds X-  1 5 Wound Imaging (photographs - any number of wounds) []  - 0 Wound Tracing (instead of photographs) X- 1 5 Simple Wound Measurement - one wound []  - 0 Complex Wound Measurement - multiple wounds INTERVENTIONS - Wound Dressings X - Small Wound Dressing one or multiple wounds 1 10 []  - 0 Medium Wound Dressing one or multiple wounds []  - 0 Large Wound Dressing one or multiple wounds X- 1 5 Application of Medications - topical []  - 0 Application of Medications - injection INTERVENTIONS - Miscellaneous []  - External ear exam 0 []  - 0 Specimen Collection (cultures, biopsies, blood, body fluids, etc.) []  - 0 Specimen(s) / Culture(s) sent or taken to Lab for analysis []  - 0 Patient Transfer (multiple staff / Civil Service fast streamer / Similar devices) []  - 0 Simple Staple / Suture removal (25 or less) []  - 0 Complex Staple / Suture removal (26 or more) []  - 0 Hypo / Hyperglycemic Management (close monitor of Blood Glucose) []  - 0 Ankle / Brachial Index (ABI) - do not check if billed separately X- 1 5 Vital Signs [name], [name] ([id number]) Has the patient been seen at the hospital within the last three years: Yes Total Score: 80 Level Of Care: New/Established - Level 3 Electronic Signature(s) Signed: 12/04/2017 3:48:25 PM By: Alric Quan Entered By: Alric Quan on 12/04/2017 08:39:16 [name] ([id number]) -------------------------------------------------------------------------------- Encounter Discharge Information Details Patient Name: [name] Date of Service: 12/04/2017 8:00 AM Medical Record Number: [id number] Patient Account Number: [id number] Date of Birth/Sex: [birth year] (36 y.o. M) Treating RN: Ahmed Prima Primary Care Trenae Brunke: [name] Other Clinician: Referring Antoinette Haskett: [name] Treating Lyal Husted/Extender: Cathie Olden in Treatment: 84 Encounter Discharge Information Items Discharge Condition: Stable Ambulatory Status: Ambulatory Discharge Destination: Home Transportation: Private Auto Accompanied By: self Schedule Follow-up Appointment: Yes Clinical Summary of Care: Electronic Signature(s) Signed: 12/04/2017 3:48:25 PM By: Alric Quan Entered By: Alric Quan on 12/04/2017 08:34:05 [name] ([id number]) -------------------------------------------------------------------------------- Lower Extremity Assessment Details Patient Name: [name] Date of Service: 12/04/2017 8:00 AM Medical Record Number: [id number] Patient Account Number: [id number] Date of Birth/Sex: June 19, 1980 (36 y.o. M) Treating RN: Roger Shelter Primary Care Nicey Krah: [name] Other Clinician: Referring Jonell Brumbaugh: [name] Treating Carsin Randazzo/Extender: Cathie Olden in Treatment: 52 Edema Assessment Assessed: [Left: No] [Right: No] Edema: [Left: N] [Right: o] Vascular Assessment Claudication: Claudication Assessment [Left:None] Pulses: Dorsalis Pedis Palpable: [Left:Yes] Posterior Tibial Extremity colors, hair growth, and conditions: Extremity Color: [Left:Hyperpigmented] Hair Growth on Extremity: [Left:Yes] Temperature of Extremity: [Left:Warm] Capillary Refill: [Left:< 3 seconds] Toe Nail Assessment Left: Right: Thick: No Discolored: No Deformed: No Improper Length and Hygiene: No Electronic Signature(s) Signed: 12/04/2017 3:39:18 PM By: Roger Shelter Entered By: Roger Shelter on 12/04/2017 08:15:22 [name] ([id number]) -------------------------------------------------------------------------------- Multi Wound Chart Details Patient Name: [name] Date of Service: 12/04/2017 8:00 AM Medical Record Number: [id number] Patient Account Number:  [id number] Date of Birth/Sex: 04/20/1981 (36 y.o. 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] [N/A:N/A] Comorbid History: [1:Sleep Apnea, Hypertension, Colitis] [N/A:N/A] Date Acquired: [1:11/18/2015] [N/A:N/A] Weeks of Treatment: [1:52] [N/A:N/A] Wound Status: [1:Open] [N/A:N/A] Measurements L x W x D [1:1x2x0.3] [N/A:N/A] (cm) Area (cm) : [1:1.571] [N/A:N/A] Volume (  cm) : [1:0.471] [N/A:N/A] % Reduction in Area: [1:68.00%] [N/A:N/A] % Reduction in Volume: [1:88.00%] [N/A:N/A] Classification: [1:Full Thickness Without Exposed Support Structures] [N/A:N/A] Exudate Amount: [1:Medium] [N/A:N/A] Exudate Type: [1:Serous] [N/A:N/A] Exudate Color: [1:amber] [N/A:N/A] Wound Margin: [1:Distinct, outline attached] [N/A:N/A] Granulation Amount: [1:None Present (0%)] [N/A:N/A] Necrotic Amount: [1:Large (67-100%)] [N/A:N/A] Exposed Structures: [1:Fat Layer (Subcutaneous Tissue) Exposed: Yes Fascia: No Tendon: No Muscle: No Joint: No Bone: No] [N/A:N/A] Epithelialization: [1:Medium (34-66%)] [N/A:N/A] Periwound Skin Texture: [1:Induration: Yes Excoriation: No Callus: No Crepitus: No Rash: No Scarring: No] [N/A:N/A] Periwound Skin Moisture: Maceration: No N/A N/A Dry/Scaly: No Periwound Skin Color: Erythema: Yes N/A N/A Hemosiderin Staining: Yes Atrophie Blanche: No Cyanosis: No Ecchymosis: No Mottled: No Pallor: No Rubor: No Erythema Location: Circumferential N/A N/A Erythema Change: Increased N/A  N/A Temperature: No Abnormality N/A N/A Tenderness on Palpation: Yes N/A N/A Wound Preparation: Ulcer Cleansing: Wound N/A N/A Cleanser Topical Anesthetic Applied: Other: Lidocaine 4% Treatment Notes Electronic Signature(s) Signed: 12/04/2017 8:28:29 AM By: Lawanda Cousins Entered By: Lawanda Cousins on 12/04/2017 08:28:29 [name], [name] ([id number]) -------------------------------------------------------------------------------- [place] Details Patient Name: [name] Date of Service: 12/04/2017 8:00 AM Medical Record Number: [id number] Patient Account Number: [id number] Date of Birth/Sex: 1981-01-24 (36 y.o. M) Treating RN: Ahmed Prima Primary Care Rashawna Scoles: [name] Other Clinician: Referring [place] Dohmen: [name] Treating Teng Decou/Extender: Cathie Olden in Treatment: 63 Active Inactive ` Orientation to the Wound Care Program Nursing Diagnoses: Knowledge deficit related to the wound healing center program Goals: Patient/caregiver will verbalize understanding of the [facility] Date Initiated: 12/11/2016 Target Resolution Date: 03/13/2017 Goal Status: Active Interventions: Provide education on orientation to the wound center Notes: ` Venous Leg Ulcer Nursing Diagnoses: Knowledge deficit related to disease process and management Potential for venous Insuffiency (use before diagnosis confirmed) Goals: Non-invasive venous studies are completed as ordered Date Initiated: 12/11/2016 Target Resolution Date: 03/13/2017 Goal Status: Active Patient will maintain optimal edema control Date Initiated: 12/11/2016 Target Resolution Date: 03/13/2017 Goal Status: Active Patient/caregiver will verbalize understanding of disease process and disease management Date Initiated: 12/11/2016 Target Resolution Date: 03/13/2017 Goal Status: Active Verify adequate tissue perfusion prior to therapeutic compression application Date  Initiated: 12/11/2016 Target Resolution Date: 03/13/2017 Goal Status: Active Interventions: Assess peripheral edema status every visit. Compression as ordered Provide education on venous insufficiency Treatment Activities: [name], [name] ([id number]) Therapeutic compression applied : 12/11/2016 Notes: ` Wound/Skin Impairment Nursing Diagnoses: Impaired tissue integrity Knowledge deficit related to ulceration/compromised skin integrity Goals: Patient/caregiver will verbalize understanding of skin care regimen Date Initiated: 12/11/2016 Target Resolution Date: 03/13/2017 Goal Status: Active Ulcer/skin breakdown will have a volume reduction of 30% by week 4 Date Initiated: 12/11/2016 Target Resolution Date: 03/13/2017 Goal Status: Active Ulcer/skin breakdown will have a volume reduction of 50% by week 8 Date Initiated: 12/11/2016 Target Resolution Date: 03/13/2017 Goal Status: Active Ulcer/skin breakdown will have a volume reduction of 80% by week 12 Date Initiated: 12/11/2016 Target Resolution Date: 03/13/2017 Goal Status: Active Ulcer/skin breakdown will heal within 14 weeks Date Initiated: 12/11/2016 Target Resolution Date: 03/13/2017 Goal Status: Active Interventions: Assess patient/caregiver ability to obtain necessary supplies Assess patient/caregiver ability to perform ulcer/skin care regimen upon admission and as needed Assess ulceration(s) every visit Provide education on ulcer and skin care Treatment Activities: Skin care regimen initiated : 12/11/2016 Topical wound management initiated : 12/11/2016 Notes: Electronic Signature(s) Signed: 12/04/2017 3:48:25 PM By: Alric Quan Entered By: Alric Quan on 12/04/2017 08:26:41 [name] ([id number]) -------------------------------------------------------------------------------- Pain Assessment Details Patient Name: [name] Date of  Service: 12/04/2017 8:00 AM Medical Record Number: [id number] Patient  Account Number: [id number] Date of Birth/Sex: Dec 24, 1980 (36 y.o. M) Treating RN: Roger Shelter Primary Care Ashton Sabine: [name] Other Clinician: Referring Donie Moulton: [name] Treating Fiona Coto/Extender: Cathie Olden in Treatment: 32 Active Problems Location of Pain Severity and Description of Pain Patient Has Paino No Site Locations Pain Management and Medication Current Pain Management: Electronic Signature(s) Signed: 12/04/2017 3:39:18 PM By: Roger Shelter Entered By: Roger Shelter on 12/04/2017 08:08:37 [name] ([id number]) -------------------------------------------------------------------------------- Patient/Caregiver Education Details Patient Name: [name] Date of Service: 12/04/2017 8:00 AM Medical Record Number: [id number] Patient Account Number: [id number] Date of Birth/Gender: 03-01-1981 (36 y.o. M) Treating RN: Ahmed Prima Primary Care Physician: [name] Other Clinician: Referring Physician: [name] Treating Physician/Extender: Cathie Olden in Treatment: 13 Education Assessment Education Provided To: Patient Education Topics Provided Wound/Skin Impairment: Handouts: Caring for Your Ulcer, Skin Care Do's and Dont's, Other: change dressing as ordered Methods: Demonstration, Explain/Verbal Responses: State content correctly Electronic Signature(s) Signed: 12/04/2017 3:48:25 PM By: Alric Quan Entered By: Alric Quan on 12/04/2017 08:34:24 [name] ([id number]) -------------------------------------------------------------------------------- Wound Assessment Details Patient Name: [name] Date of Service: 12/04/2017 8:00 AM Medical Record Number: [id number] Patient Account Number: [id number] Date of Birth/Sex: [birth year] (36 y.o. M) Treating RN: Roger Shelter Primary Care Kit Mollett: [name] Other Clinician: Referring Erle Guster: [name] Treating Jayley Hustead/Extender:  Cathie Olden in Treatment: 52 Wound Status Wound Number: 1 Primary Etiology: Pyoderma Wound Location: Left Lower Leg - Lateral Wound Status: Open Wounding Event: Gradually Appeared Comorbid History: Sleep Apnea, Hypertension, Colitis Date Acquired: 11/18/2015 Weeks Of Treatment: 52 Clustered Wound: No Photos Photo Uploaded By: Roger Shelter on 12/04/2017 15:40:30 Wound Measurements Length: (cm) 1 Width: (cm) 2 Depth: (cm) 0.3 Area: (cm) 1.571 Volume: (cm) 0.471 % Reduction in Area: 68% % Reduction in Volume: 88% Epithelialization: Medium (34-66%) Tunneling: No Undermining: No Wound Description Full Thickness Without Exposed Support Classification: Structures Wound Margin: Distinct, outline attached Exudate Medium Amount: Exudate Type: Serous Exudate Color: amber Foul Odor After Cleansing: No Slough/Fibrino Yes Wound Bed Granulation Amount: None Present (0%) Exposed Structure Necrotic Amount: Large (67-100%) Fascia Exposed: No Necrotic Quality: Adherent Slough Fat Layer (Subcutaneous Tissue) Exposed: Yes Tendon Exposed: No Muscle Exposed: No Joint Exposed: No Bone Exposed: No [name], [name] ([id number]) Periwound Skin Texture Texture Color No Abnormalities Noted: No No Abnormalities Noted: No Callus: No Atrophie Blanche: No Crepitus: No Cyanosis: No Excoriation: No Ecchymosis: No Induration: Yes Erythema: Yes Rash: No Erythema Location: Circumferential Scarring: No Erythema Change: Increased Hemosiderin Staining: Yes Moisture Mottled: No No Abnormalities Noted: No Pallor: No Dry / Scaly: No Rubor: No Maceration: No Temperature / Pain Temperature: No Abnormality Tenderness on Palpation: Yes Wound Preparation Ulcer Cleansing: Wound Cleanser Topical Anesthetic Applied: Other: Lidocaine 4%, Treatment Notes Wound #1 (Left, Lateral Lower Leg) 1. Cleansed with: Clean wound with Normal Saline 2. Anesthetic Topical Lidocaine 4% cream  to wound bed prior to debridement 4. Dressing Applied: Santyl Ointment 5. Secondary Dressing Applied Bordered Foam Dressing Electronic Signature(s) Signed: 12/04/2017 3:39:18 PM By: Roger Shelter Entered By: Roger Shelter on 12/04/2017 08:14:20 [name] ([id number]) -------------------------------------------------------------------------------- [place] Details Patient Name: [name] Date of Service: 12/04/2017 8:00 AM Medical Record Number: [id number] Patient Account Number: [id number] Date of Birth/Sex: May 05, 1981 (36 y.o. M) Treating RN: Roger Shelter Primary Care Garlon Tuggle: [name] Other Clinician: Referring Hermen Mario: [name] Treating Margarette Vannatter/Extender: Cathie Olden in Treatment: 81 Vital Signs Time Taken: 08:10 Temperature (F): 98.2 Height (in): 71 Pulse (bpm):  75 Weight (lbs): 338 Respiratory Rate (breaths/min): 18 Body Mass Index (BMI): 47.1 Blood Pressure (mmHg): 147/83 Reference Range: 80 - 120 mg / dl Electronic Signature(s) Signed: 12/04/2017 3:39:18 PM By: Roger Shelter Entered By: Roger Shelter on 12/04/2017 08:10:27

## 2017-12-05 NOTE — Progress Notes (Addendum)
ARIAS, WEINERT ([id number]) Visit Report for 12/04/2017 Chief Complaint Document Details Patient Name: [name] Date of Service: 12/04/2017 8:00 AM Medical Record Number: [id number] Patient Account Number: [id number] Date of Birth/Sex: 1981/03/03 (37 y.o. M) Treating RN: Phillis Haggis Primary Care Provider: [name] Other Clinician: Referring Provider: [name] Treating Provider/Extender: Kathreen Cosier in Treatment: 66 Information Obtained from: Patient Chief Complaint He is here in follow up evaluation for LLE pyoderma ulcer Electronic Signature(s) Signed: 12/04/2017 8:28:40 AM By: Bonnell Public Entered By: Bonnell Public on 12/04/2017 08:28:39 [name] ([id number]) -------------------------------------------------------------------------------- HPI Details Patient Name: [name] Date of Service: 12/04/2017 8:00 AM Medical Record Number: [id number] Patient Account Number: [id number] Date of Birth/Sex: Aug 14, 1980 (36 y.o. M) Treating RN: Phillis Haggis Primary Care Provider: [name] Other Clinician: Referring Provider: [name] Treating Provider/Extender: Kathreen Cosier in Treatment: 22 History of Present Illness HPI Description: 12/04/16; 37 year old man who comes into the clinic today for review of a wound on the posterior left calf. He tells me that is been there for about a year. He is not a diabetic he does smoke half a pack per day. He was seen in the ER on 11/20/16 felt to have cellulitis around the wound and was given clindamycin. An x-ray did not show osteomyelitis. The patient initially tells me that he has a milk allergy that sets off a pruritic itching rash on his lower legs which she scratches incessantly and he thinks that's what may have set up the wound. He has been using various topical antibiotics and ointments without any effect. He works in a [employer]ing [employer] and is on his feet all day. He does not have a prior history  of wounds however he does have the rash on both lower legs the right arm and the ventral aspect of his left arm. These are excoriations and clearly have had scratching however there are of macular looking areas on both legs including a substantial larger area on the right leg. This does not have an underlying open area. There is no blistering. 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 He seemed to recognize this. 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 He is not in any treatment for this. He's had no recent travel 12/11/16; the patient arrives today with his wound and roughly the same condition we've been using silver alginate this is a deep punched out wound with some surrounding erythema but no tenderness. Biopsy I did did not show confirmed pyoderma gangrenosum suggested nonspecific inflammation and vasculitis but does not provide an actual description of what was seen by the pathologist. I'm really not able to understand this We have also received information from the patient's dermatologist in [place] notes from April 2016. This was a doctor Agarwal- antal. 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 He was prescribed topical steroid high potency under occlusion which helped but at this point the patient did not have a deep punched out wound. 12/18/16; the patient's wound is larger in terms of surface area however this surface looks better and there is less depth. The surrounding erythema also is better. The patient states that the wrap we put on came off 2 days ago when he has been using his compression stockings. He we  are in the process of getting a dermatology consult. 12/26/16 on evaluation today patient's left lower extremity wound shows evidence of infection  with surrounding erythema noted. He has been tolerating the dressing changes but states that he has noted more discomfort. There is a larger area of erythema surrounding the wound. No fevers, chills, nausea, or vomiting noted at this time. With that being said the wound still does have slough covering the surface. He is not allergic to any medication that he is aware of at this point. In regard to his right lower extremity he had several regions that are erythematous and pruritic he wonders if there's anything we can do to help that. 01/02/17 I reviewed patient's wound culture which was obtained his visit last week. He was placed on doxycycline at that point. Unfortunately that does not appear to be an antibiotic that would likely help with the situation however the pseudomonas noted on culture is sensitive to Cipro. Also unfortunately patient's wound seems to have a large compared to last week's evaluation. Not severely so but there are definitely increased measurements in general. He is continuing to have discomfort as well he writes this to be a seven out of 10. In fact he would prefer me not to perform any debridement today due to the fact that he is having discomfort and considering he has an active infection on the little reluctant to do so anyway. No fevers, chills, nausea, or vomiting noted at this time. 01/08/17; patient seems dermatology on September 5. I suspect dermatology will want the slides from the biopsy I did sent to their pathologist. I'm not sure if there is a way we can expedite that. In any case the culture I did before I left on vacation 3 weeks ago showed Pseudomonas he was given 10 days of Cipro and per her description of her intake nurses is actually somewhat better this week although the wound is quite a bit bigger than I remember the last time I saw this. He still has 3 more days of Cipro [name], [name] ([id number]) 01/21/17; dermatology appointment tomorrow. He has completed  the ciprofloxacin for Pseudomonas. Surface of the wound looks better however he is had some deterioration in the lesions on his right leg. Meantime the left lateral leg wound we will continue with sample 01/29/17; patient had his dermatology appointment but I can't yet see that note. He is completed his antibiotics. The wound is more superficial but considerably larger in circumferential area than when he came in. This is in his left lateral calf. He also has swollen erythematous areas with superficial wounds on the right leg and small papular areas on both arms. There apparently areas in her his upper thighs and buttocks I did not look at those. Dermatology biopsied the right leg. Hopefully will have their input next week. 02/05/17; patient went back to see his dermatologist who told him that he had a "scratching problem" as well as staph. He is now on a 30 day course of doxycycline and I believe she gave him triamcinolone cream to the right leg areas to help with the itching [not exactly sure but probably triamcinolone]. She apparently looked at the left lateral leg wound although this was not rebiopsied and I think felt to be ultimately part of the same pathogenesis. He is using sample border foam and changing nevus himself. He now has a new open area on the right posterior leg which was his biopsy site I don't have any of the dermatology  notes 02/12/17; we put the patient in compression last week with SANTYL to the wound on the left leg and the biopsy. Edema is much better and the depth of the wound is now at level of skin. Area is still the same oBiopsy site on the right lateral leg we've also been using santyl with a border foam dressing and he is changing this himself. 02/19/17; Using silver alginate started last week to both the substantial left leg wound and the biopsy site on the right wound. He is tolerating compression well. Has a an appointment with his primary M.D. tomorrow wondering  about diuretics although I'm wondering if the edema problem is actually lymphedema 02/26/17; the patient has been to see his primary doctor Dr. Jerelyn CharlesKathryn Clark at [facility]. She started him on Lasix 20 mg and this seems to have helped with the edema. However we are not making substantial change with the left lateral calf wound and inflammation. The biopsy site on the right leg also looks stable but not really all that different. 03/12/17; the patient has been to see vein and vascular Dr. Wyn QuakerEW. He has had venous reflux studies I have not reviewed these. I did get a call from his dermatology office. 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 They therefore recommended clobetasol and tetracycline i.e. 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 03/26/17; apparently the patient just had reflux studies not an appointment with Dr. dew. She arrives in clinic today having applied clobetasol for 2-3 weeks. He notes over the last 2-3 days excessive drainage having to change the dressing 3-4 times a day and also expanding erythema. 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 who kindly saw him at my request. 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 My concern was the degree of erythema medially extending from his left leg wound which was either inflammation from pyoderma  or cellulitis. I put him on Augmentin however culture of the wound showed Pseudomonas which is quinolone sensitive. I really don't believe he has cellulitis however in view of everything I will continue and give him a course of Cipro. He is also on doxycycline as an immune modulator for the pyoderma. In addition to his original wound on the left lateral leg with surrounding erythema he has a wound on the right posterior calf which was an original biopsy site done by dermatology. 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 Saw Dr. Carles ColletPearlstein yesterday. He has been using topical antibiotics to both wound areas his original wound on the left and the biopsies/pathergy area on the right. There is definitely some improvement in the inflammation around the wound on the right although the patient states he has increasing sensitivity of the wounds. He is on prednisone 60 and doxycycline 1 as prescribed by Dr. Carles ColletPearlstein. He is covering the topical antibiotic with gauze and putting  this in his own compression stocks and changing this daily. He states that Dr. Aquilla Hacker did a culture of the left leg wound yesterday 05/07/17; pyoderma gangrenosum. The patient saw Dr. Carles Collet yesterday and has a follow-up with her in one month. He is still using topical antibiotics to both wounds although he can't recall exactly what type. He is still on prednisone 60 mg. Dr. Carles Collet stated that the doxycycline could stop if we were in agreement. 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 The right medial leg was induced by biopsy/pathergy. The area on the right is essentially healed. 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 The right medial leg has closed and remains closed.  He is still on prednisone 60. oHe tells me he missed his last dermatology appointment with Dr. Carles Collet but will make another appointment. He reports that her blood sugar at a recent screen in [place] was high 200's. He was 180 today. He is more cushingoid blood pressure is Perham, Demarie ([id number]) up a bit. I think he is going to require still much longer prednisone perhaps another 3 months before attempting to taper. In the meantime his wound is a lot better. Smaller. He is cleaning this off daily and applying topical antibiotics. When he was last in the clinic I thought about changing to [facility] and actually put in a couple of calls to dermatology although probably not during their business hours. 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 He continues on oral prednisone. He has been using triple antibiotic ointment. There is surface debris and we will transition to [facility] and have him return in 2 weeks. He has lost 30 pounds since his last appointment with lifestyle modification. He may benefit from topical steroid cream for treatment this can be considered at a later date. 08/22/17 on evaluation today patient appears to actually be doing rather well in regard to his left lateral lower extremity ulcer. He has actually been managed by Dr. Leanord Hawking most recently. Patient is currently on oral steroids at this time. This seems to have been of benefit for him. Nonetheless his last visit was actually with Aminta Sakurai on 08/06/17. Currently he is not utilizing any topical steroid creams although this could be of benefit as well. No fevers, chills, nausea, or vomiting noted at this time. 09/05/17 on evaluation today patient appears to be doing better in regard to his left lateral lower extremity ulcer. He has been tolerating the dressing changes without complication. He is using  Santyl with good effect. Overall I'm very pleased with how things are standing at this point. Patient likewise is happy that this is doing better. 09/19/17 on evaluation today patient actually appears to be doing rather well in regard to his left lateral lower extremity ulcer. 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 He's not having any significant pain. 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 He has been tolerating the Santyl without complication and in general I feel like he's making good progress. 10/17/17 on evaluation today  patient appears to be doing very well in regard to his left lateral lower surety ulcer. He has been tolerating the dressing changes without complication. There does not appear to be any evidence of infection he's alternating the Santyl and the triple antibiotic ointment every other day this seems to be doing well for him. 11/03/17 on evaluation today patient appears to be doing very well in regard to his left lateral lower extremity ulcer. He is been tolerating the dressing changes without complication which is good news. Fortunately there does not appear to be any evidence of infection which is also great news. Overall is doing excellent they are starting to taper down on the prednisone is down to 40 mg at this point it also started topical clobetasol for him. 11/17/17 on evaluation today patient appears to be doing well in regard to his left lateral lower surety ulcer. He's been tolerating the dressing changes without complication. He does note that he is having no pain, no excessive drainage or discharge, and overall he feels like things are going about how he would expect and hope they would. Overall he seems to have no evidence of infection at this time in my opinion which is good  news. 12/04/17-He is seen in follow-up evaluation for right lateral lower extremity ulcer. He has been applying topical steroid cream. Today's measurement show slight increase in size. Over the next 2 weeks we will transition to every other day Santyl and steroid cream. He has been encouraged to monitor for changes and notify clinic with any concerns Electronic Signature(s) Signed: 12/04/2017 8:30:31 AM By: Bonnell Public Entered By: Bonnell Public on 12/04/2017 08:30:31 [name] ([id number]) -------------------------------------------------------------------------------- Physician Orders Details Patient Name: [name] Date of Service: 12/04/2017 8:00 AM Medical Record Number: [id number] Patient Account Number: [id number] Date of Birth/Sex: 12-31-1980 (36 y.o. M) Treating RN: Phillis Haggis Primary Care Provider: [name] Other Clinician: Referring Provider: [name] Treating Provider/Extender: Kathreen Cosier in Treatment: 73 Verbal / Phone Orders: Yes Clinician: Ashok Cordia, Debi Read Back and Verified: Yes Diagnosis Coding Wound Cleansing Wound #1 Left,Lateral Lower Leg o Clean wound with Normal Saline. o Cleanse wound with mild soap and water o May Shower, gently pat wound dry prior to applying new dressing. Anesthetic (add to Medication List) Wound #1 Left,Lateral Lower Leg o Topical Lidocaine 4% cream applied to wound bed prior to debridement (In Clinic Only). Skin Barriers/Peri-Wound Care Wound #1 Left,Lateral Lower Leg o Triamcinolone Acetonide Ointment (TCA) - apply to wound Primary Wound Dressing Wound #1 Left,Lateral Lower Leg o Santyl Ointment - apply to wound and alternate days with TCA (steroid cream) o Other: - apply to wound and alternate days with Santyl Secondary Dressing Wound #1 Left,Lateral Lower Leg o Boardered Foam Dressing Dressing Change Frequency Wound #1 Left,Lateral Lower Leg o Change dressing every  day. Follow-up Appointments Wound #1 Left,Lateral Lower Leg o Return Appointment in 2 weeks. Edema Control Wound #1 Left,Lateral Lower Leg o Patient to wear own compression stockings Additional Orders / Instructions Wound #1 Left,Lateral Lower Leg o Stop Smoking o Increase protein intake. [name], [name] ([id number]) Patient Medications Allergies: mik, Biaxin, seasonal Notifications Medication Indication Start End lidocaine DOSE 1 - topical 4 % cream - 1 cream topical Electronic Signature(s) Signed: 12/04/2017 3:48:25 PM By: Alejandro Mulling Signed: 12/04/2017 4:35:32 PM By: Bonnell Public Entered By: Alejandro Mulling on 12/04/2017 08:38:41 [name], [name] ([id number]) -------------------------------------------------------------------------------- Prescription 12/04/2017 Patient Name: [name] Provider: Bonnell Public NP Date of Birth: 03-Apr-1981 NPI#: [id number] Sex: M DEA#:  [id number] Phone #: [phone] License #: Patient Address: [facility] [address] [facility] [place], [place] [address] [address], [address] [place], [place] [address] [phone] Allergies mik Biaxin seasonal Medication Medication: Route: Strength: Form: lidocaine 4 % topical cream topical 4% cream Class: TOPICAL LOCAL ANESTHETICS Dose: Frequency / Time: Indication: 1 1 cream topical Number of Refills: Number of Units: 0 Generic Substitution: Start Date: End Date: One Time Use: Substitution Permitted No Note to Pharmacy: Signature(s): Date(s): Electronic Signature(s) Signed: 12/04/2017 3:48:25 PM By: Alejandro Mulling Signed: 12/04/2017 4:35:32 PM By: Lisbeth Ply, [name] ([id number]) Entered By: Alejandro Mulling on 12/04/2017 08:38:41 [name] ([id number]) --------------------------------------------------------------------------------  Problem List Details Patient Name: [name] Date of  Service: 12/04/2017 8:00 AM Medical Record Number: [id number] Patient Account Number: [id number] Date of Birth/Sex: 11-Dec-1980 (36 y.o. M) Treating RN: Phillis Haggis Primary Care Provider: [name] Other Clinician: Referring Provider: [name] Treating Provider/Extender: Kathreen Cosier in Treatment: 31 Active Problems ICD-10 Evaluated Encounter Code Description Active Date Today Diagnosis L97.222 Non-pressure chronic ulcer of left calf with fat layer exposed 12/04/2016 No Yes L88 Pyoderma gangrenosum 03/26/2017 No Yes I87.2 Venous insufficiency (chronic) (peripheral) 12/04/2016 No Yes Inactive Problems ICD-10 Code Description Active Date Inactive Date L97.213 Non-pressure chronic ulcer of right calf with necrosis of muscle 04/02/2017 04/02/2017 Resolved Problems Electronic Signature(s) Signed: 12/04/2017 8:28:23 AM By: Bonnell Public Entered By: Bonnell Public on 12/04/2017 59:56:38 [name] ([id number]) -------------------------------------------------------------------------------- Progress Note Details Patient Name: [name] Date of Service: 12/04/2017 8:00 AM Medical Record Number: [id number] Patient Account Number: [id number] Date of Birth/Sex: 1981-01-22 (36 y.o. M) Treating RN: Phillis Haggis Primary Care Provider: [name] Other Clinician: Referring Provider: [name] Treating Provider/Extender: Kathreen Cosier in Treatment: 11 Subjective Chief Complaint Information obtained from Patient He is here in follow up evaluation for LLE pyoderma ulcer History of Present Illness (HPI) 12/04/16; 37 year old man who comes into the clinic today for review of a wound on the posterior left calf. He tells me that is been there for about a year. He is not a diabetic he does smoke half a pack per day. He was seen in the ER on 11/20/16 felt to have cellulitis around the wound and was given clindamycin. An x-ray did not show osteomyelitis. The  patient initially tells me that he has a milk allergy that sets off a pruritic itching rash on his lower legs which she scratches incessantly and he thinks that's what may have set up the wound. He has been using various topical antibiotics and ointments without any effect. He works in a [employer]ing [employer] and is on his feet all day. He does not have a prior history of wounds however he does have the rash on both lower legs the right arm and the ventral aspect of his left arm. These are excoriations and clearly have had scratching however there are of macular looking areas on both legs including a substantial larger area on the right leg. This does not have an underlying open area. There is no blistering. 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 He seemed to recognize this. 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 He is not in any treatment for this. He's  had no recent travel 12/11/16; the patient arrives today with his wound and roughly the same condition we've been using silver alginate this is a deep punched out wound with some surrounding erythema but no tenderness. Biopsy I did did not show confirmed pyoderma gangrenosum suggested nonspecific inflammation and vasculitis but does not provide an actual description of what was seen by the pathologist. I'm really not able to understand this We have also received information from the patient's dermatologist in [place] notes from April 2016. This was a doctor Agarwal- antal. 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 He was prescribed topical steroid high potency under occlusion which helped but at this point the patient did not have a deep punched out wound. 12/18/16; the patient's wound is larger in terms of surface area  however this surface looks better and there is less depth. The surrounding erythema also is better. The patient states that the wrap we put on came off 2 days ago when he has been using his compression stockings. He we are in the process of getting a dermatology consult. 12/26/16 on evaluation today patient's left lower extremity wound shows evidence of infection with surrounding erythema noted. He has been tolerating the dressing changes but states that he has noted more discomfort. There is a larger area of erythema surrounding the wound. No fevers, chills, nausea, or vomiting noted at this time. With that being said the wound still does have slough covering the surface. He is not allergic to any medication that he is aware of at this point. In regard to his right lower extremity he had several regions that are erythematous and pruritic he wonders if there's anything we can do to help that. 01/02/17 I reviewed patient's wound culture which was obtained his visit last week. He was placed on doxycycline at that point. Unfortunately that does not appear to be an antibiotic that would likely help with the situation however the pseudomonas noted on culture is sensitive to Cipro. Also unfortunately patient's wound seems to have a large compared to last week's evaluation. Not severely so but there are definitely increased measurements in general. He is continuing to have discomfort as well he writes this to be a seven out of 10. In fact he would prefer me not to perform any debridement today due to the fact that he is having discomfort and considering he has an active infection on the little reluctant to do so anyway. No fevers, [name], [name] ([id number]) chills, nausea, or vomiting noted at this time. 01/08/17; patient seems dermatology on September 5. I suspect dermatology will want the slides from the biopsy I did sent to their pathologist. I'm not sure if there is a way we can expedite that. In any case  the culture I did before I left on vacation 3 weeks ago showed Pseudomonas he was given 10 days of Cipro and per her description of her intake nurses is actually somewhat better this week although the wound is quite a bit bigger than I remember the last time I saw this. He still has 3 more days of Cipro 01/21/17; dermatology appointment tomorrow. He has completed the ciprofloxacin for Pseudomonas. Surface of the wound looks better however he is had some deterioration in the lesions on his right leg. Meantime the left lateral leg wound we will continue with sample 01/29/17; patient had his dermatology appointment but I can't yet see that note. He is completed his antibiotics. The wound is more superficial but considerably larger in  circumferential area than when he came in. This is in his left lateral calf. He also has swollen erythematous areas with superficial wounds on the right leg and small papular areas on both arms. There apparently areas in her his upper thighs and buttocks I did not look at those. Dermatology biopsied the right leg. Hopefully will have their input next week. 02/05/17; patient went back to see his dermatologist who told him that he had a "scratching problem" as well as staph. He is now on a 30 day course of doxycycline and I believe she gave him triamcinolone cream to the right leg areas to help with the itching [not exactly sure but probably triamcinolone]. She apparently looked at the left lateral leg wound although this was not rebiopsied and I think felt to be ultimately part of the same pathogenesis. He is using sample border foam and changing nevus himself. He now has a new open area on the right posterior leg which was his biopsy site I don't have any of the dermatology notes 02/12/17; we put the patient in compression last week with SANTYL to the wound on the left leg and the biopsy. Edema is much better and the depth of the wound is now at level of skin. Area is still  the same Biopsy site on the right lateral leg we've also been using santyl with a border foam dressing and he is changing this himself. 02/19/17; Using silver alginate started last week to both the substantial left leg wound and the biopsy site on the right wound. He is tolerating compression well. Has a an appointment with his primary M.D. tomorrow wondering about diuretics although I'm wondering if the edema problem is actually lymphedema 02/26/17; the patient has been to see his primary doctor Dr. Jerelyn Charles at [facility]. She started him on Lasix 20 mg and this seems to have helped with the edema. However we are not making substantial change with the left lateral calf wound and inflammation. The biopsy site on the right leg also looks stable but not really all that different. 03/12/17; the patient has been to see vein and vascular Dr. Wyn Quaker. He has had venous reflux studies I have not reviewed these. I did get a call from his dermatology office. 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 They therefore recommended clobetasol and tetracycline i.e. 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 03/26/17; apparently the patient just had reflux studies not an appointment with Dr. dew. She arrives in clinic today having applied clobetasol for 2-3 weeks. He notes over the last 2-3 days excessive drainage having to change the dressing 3-4 times a day and also expanding erythema. He states the expanding erythema seems to come and go and was last this red was earlier in the month.he is on doxycycline 150 mg twice a day as an anti-inflammatory systemic therapy for possible pyoderma gangrenosum along with the topical  clobetasol 04/02/17; the patient was seen last week by Dr. Carles Collet at [facility] dermatology locally who kindly saw him at my request. 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 My concern was the degree of erythema medially extending from his left leg wound  which was either inflammation from pyoderma or cellulitis. I put him on Augmentin however culture of the wound showed Pseudomonas which is quinolone sensitive. I really don't believe he has cellulitis however in view of everything I will continue and give him a course of Cipro. He is also on doxycycline as an immune modulator for the pyoderma. In addition to his original wound on the left lateral leg with surrounding erythema he has a wound on the right posterior calf which was an original biopsy site done by dermatology. 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 Saw Dr. Carles Collet yesterday. He has been using topical antibiotics to both wound areas his original wound on the left and the biopsies/pathergy area on the right. There is definitely some improvement in the inflammation around the wound on the right although the patient states he has increasing sensitivity of the wounds. He is on prednisone 60 and doxycycline 1 as prescribed by Dr. Carles Collet. He is covering the topical antibiotic with gauze and putting this in his own compression stocks and changing this daily. He states that Dr. Aquilla Hacker did a culture of the left leg wound yesterday 05/07/17; pyoderma gangrenosum. The patient saw Dr. Carles Collet yesterday and has a follow-up with her in one month. He is still using topical antibiotics to both wounds although he can't recall exactly what type. He is still on prednisone 60 mg. Dr. Carles Collet stated that the doxycycline could stop if we were in agreement. 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 The right medial leg was induced by [name] ([id number]) biopsy/pathergy. The area on the right is essentially healed. 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 The right medial leg has closed and remains closed. He is still on prednisone 60. He tells me he missed his last dermatology appointment with Dr. Carles Collet but will make another appointment. He reports that her blood sugar at a recent screen in [place] was high 200's. He was 180 today. 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 I think he is going to require still much longer prednisone perhaps another 3 months before attempting to taper. In the meantime his wound is a lot better. Smaller. He is cleaning this off daily and applying topical antibiotics. When he was last in the clinic I thought about changing to [facility] and actually put in a couple of calls to dermatology although probably not during their business hours. 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 He continues on oral prednisone. He has been using triple antibiotic ointment. There is surface debris and we will transition to [facility] and have him return in 2 weeks. He has lost 30 pounds since his last appointment with lifestyle modification. He may benefit from topical steroid cream for treatment this can be considered at a later date. 08/22/17 on evaluation today patient appears to actually be doing rather well in regard to his left lateral lower extremity ulcer. He has actually been managed by Dr. Leanord Hawking most recently. Patient is currently on oral steroids at this time. This seems to have been of benefit for him. Nonetheless his last visit was actually with Rodrigo Mcgranahan on  08/06/17.  Currently he is not utilizing any topical steroid creams although this could be of benefit as well. No fevers, chills, nausea, or vomiting noted at this time. 09/05/17 on evaluation today patient appears to be doing better in regard to his left lateral lower extremity ulcer. He has been tolerating the dressing changes without complication. He is using Santyl with good effect. Overall I'm very pleased with how things are standing at this point. Patient likewise is happy that this is doing better. 09/19/17 on evaluation today patient actually appears to be doing rather well in regard to his left lateral lower extremity ulcer. 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 He's not having any significant pain. 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 He has been tolerating the Santyl without complication and in general I feel like he's making good progress. 10/17/17 on evaluation today patient appears to be doing very well in regard to his left lateral lower surety ulcer. He has been tolerating the dressing changes without complication. There does not appear to be any evidence of infection he's alternating the Santyl and the triple antibiotic ointment every other day this seems to be doing well for him. 11/03/17 on evaluation today patient appears to be doing very well in regard to his left lateral lower extremity ulcer. He is been tolerating the dressing changes without complication which is good news. Fortunately there does not appear to be any evidence of infection which is also great news. Overall is doing excellent they are starting to taper down on the prednisone is down to 40 mg at this point it also started topical clobetasol for him. 11/17/17 on evaluation today patient appears to be doing well in regard to  his left lateral lower surety ulcer. He's been tolerating the dressing changes without complication. He does note that he is having no pain, no excessive drainage or discharge, and overall he feels like things are going about how he would expect and hope they would. Overall he seems to have no evidence of infection at this time in my opinion which is good news. 12/04/17-He is seen in follow-up evaluation for right lateral lower extremity ulcer. He has been applying topical steroid cream. Today's measurement show slight increase in size. Over the next 2 weeks we will transition to every other day Santyl and steroid cream. He has been encouraged to monitor for changes and notify clinic with any concerns Patient History Information obtained from Patient. Family History Diabetes - Mother,Maternal Grandparents, Heart Disease - Mother,Maternal Grandparents, Hereditary Spherocytosis - Siblings, Hypertension - Maternal Grandparents, Stroke - Siblings, No family history of Cancer, Kidney Disease, Lung Disease, Seizures, Thyroid Problems, Tuberculosis. Social History Current some day smoker, Marital Status - Married, Alcohol Use - Rarely, Drug Use - No History, Caffeine Use - Moderate. [name], [name] ([id number]) Objective Constitutional Vitals Time Taken: 8:10 AM, Height: 71 in, Weight: 338 lbs, BMI: 47.1, Temperature: 98.2 F, Pulse: 75 bpm, Respiratory Rate: 18 breaths/min, Blood Pressure: 147/83 mmHg. Integumentary (Hair, Skin) Wound #1 status is Open. Original cause of wound was Gradually Appeared. The wound is located on the Left,Lateral Lower Leg. The wound measures 1cm length x 2cm width x 0.3cm depth; 1.571cm^2 area and 0.471cm^3 volume. There is Fat Layer (Subcutaneous Tissue) Exposed exposed. There is no tunneling or undermining noted. There is a medium amount of  serous drainage noted. The wound margin is distinct with the outline attached to the wound base. There is no granulation within the  wound bed. There is a large (67-100%) amount of necrotic tissue within the wound bed including Adherent Slough. The periwound skin appearance exhibited: Induration, Hemosiderin Staining, Erythema. The periwound skin appearance did not exhibit: Callus, Crepitus, Excoriation, Rash, Scarring, Dry/Scaly, Maceration, Atrophie Blanche, Cyanosis, Ecchymosis, Mottled, Pallor, Rubor. The surrounding wound skin color is noted with erythema which is circumferential. Periwound temperature was noted as No Abnormality. The periwound has tenderness on palpation. 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 Cleanse wound with mild soap and water May Shower, gently pat wound dry prior to applying new dressing. Anesthetic (add to Medication List): Wound #1 Left,Lateral Lower Leg: Topical Lidocaine 4% cream applied to wound bed prior to debridement (In Clinic Only). Skin Barriers/Peri-Wound Care: Wound #1 Left,Lateral Lower Leg: Triamcinolone Acetonide Ointment (TCA) - apply to wound [name], [name] ([id number]) Primary Wound Dressing: Wound #1 Left,Lateral Lower Leg: Santyl Ointment - apply to wound and alternate days with TCA (steroid cream) Other: - apply to wound and alternate days with Santyl Secondary Dressing: Wound #1 Left,Lateral Lower Leg: Boardered Foam Dressing Dressing Change Frequency: Wound #1 Left,Lateral Lower Leg: Change dressing every day. Follow-up Appointments: Wound #1 Left,Lateral Lower Leg: Return Appointment in 2 weeks. Edema Control: Wound #1 Left,Lateral Lower Leg: Patient to wear own compression stockings Additional Orders / Instructions: Wound #1 Left,Lateral Lower Leg: Stop Smoking Increase protein intake. The following medication(s) was prescribed: lidocaine  topical 4 % cream 1 1 cream topical was prescribed at facility Electronic Signature(s) Signed: 12/04/2017 4:37:23 PM By: Bonnell Public Previous Signature: 12/04/2017 8:31:01 AM Version By: Bonnell Public Entered By: Bonnell Public on 12/04/2017 16:37:23 [name] ([id number]) -------------------------------------------------------------------------------- ROS/PFSH Details Patient Name: [name] Date of Service: 12/04/2017 8:00 AM Medical Record Number: [id number] Patient Account Number: [id number] Date of Birth/Sex: 05/17/1981 (36 y.o. M) Treating RN: Phillis Haggis Primary Care Provider: [name] Other Clinician: Referring Provider: [name] Treating Provider/Extender: Kathreen Cosier in Treatment: 35 Information Obtained From Patient Wound History Do you currently have one or more open woundso Yes How many open wounds do you currently haveo 1 Approximately how long have you had your woundso 1 year How have you been treating your wound(s) until nowo neosporin Has your wound(s) ever healed and then re-openedo No Have you had any lab work done in the past montho No Have you tested positive for an antibiotic resistant organism (MRSA, VRE)o No Have you tested positive for osteomyelitis (bone infection)o No Have you had any tests for circulation on your legso No Have you had other problems associated with your woundso Infection, Swelling Eyes Medical History: Negative for: Cataracts; Glaucoma; Optic Neuritis Ear/Nose/Mouth/Throat Medical History: Negative for: Chronic sinus problems/congestion; Middle ear problems Hematologic/Lymphatic Medical History: Negative for: Anemia; Hemophilia; Human Immunodeficiency Virus; Lymphedema; Sickle Cell Disease Respiratory Medical History: Positive for: Sleep Apnea - cpap Negative for: Aspiration; Asthma; Chronic Obstructive Pulmonary Disease (COPD); Pneumothorax Cardiovascular Medical History: Positive for:  Hypertension Negative for: Angina; Arrhythmia; Congestive Heart Failure; Coronary Artery Disease; Deep Vein Thrombosis; Hypotension; Myocardial Infarction; Peripheral Arterial Disease; Peripheral Venous Disease; Phlebitis; Vasculitis Gastrointestinal Medical History: Positive for: Colitis Endocrine [name], [name] ([id number]) Medical History: Negative for: Type I Diabetes; Type II Diabetes Genitourinary Medical History: Negative for: End Stage  Renal Disease Immunological Medical History: Negative for: Lupus Erythematosus; Raynaudos; Scleroderma Integumentary (Skin) Medical History: Negative for: History of Burn; History of pressure wounds Musculoskeletal Medical History: Negative for: Gout; Rheumatoid Arthritis; Osteoarthritis; Osteomyelitis Neurologic Medical History: Negative for: Dementia; Neuropathy; Quadriplegia; Paraplegia; Seizure Disorder Oncologic Medical History: Negative for: Received Chemotherapy; Received Radiation Psychiatric Medical History: Negative for: Anorexia/bulimia; Confinement Anxiety Immunizations Pneumococcal Vaccine: Received Pneumococcal Vaccination: No Implantable Devices Family and Social History Cancer: No; Diabetes: Yes - Mother,Maternal Grandparents; Heart Disease: Yes - Mother,Maternal Grandparents; Hereditary Spherocytosis: Yes - Siblings; Hypertension: Yes - Maternal Grandparents; Kidney Disease: No; Lung Disease: No; Seizures: No; Stroke: Yes - Siblings; Thyroid Problems: No; Tuberculosis: No; Current some day smoker; Marital Status - Married; Alcohol Use: Rarely; Drug Use: No History; Caffeine Use: Moderate; Financial Concerns: No; Food, Clothing or Shelter Needs: No; Support System Lacking: No; Transportation Concerns: No; Advanced Directives: No; Patient does not want information on Advanced Directives; Living Will: No Physician Affirmation I have reviewed and agree with the above information. Electronic Signature(s) Signed: 12/04/2017  3:48:25 PM By: Alejandro Mulling Signed: 12/04/2017 4:35:32 PM By: Lisbeth Ply, [name] ([id number]) Entered By: Bonnell Public on 12/04/2017 08:30:41 [name], [name] ([id number]) -------------------------------------------------------------------------------- SuperBill Details Patient Name: [name] Date of Service: 12/04/2017 Medical Record Number: [id number] Patient Account Number: [id number] Date of Birth/Sex: 1980-08-25 (37 y.o. M) Treating RN: Phillis Haggis Primary Care Provider: [name] Other Clinician: Referring Provider: [name] Treating Provider/Extender: Kathreen Cosier in Treatment: 33 Diagnosis Coding ICD-10 Codes Code Description [phone] Non-pressure chronic ulcer of left calf with fat layer exposed L88 Pyoderma gangrenosum I87.2 Venous insufficiency (chronic) (peripheral) Facility Procedures CPT4 Code: [id number] Description: 99213 - WOUND CARE VISIT-LEV 3 EST PT Modifier: Quantity: 1 Physician Procedures CPT4 Code: [id number] Description: 99213 - WC PHYS LEVEL 3 - EST PT ICD-10 Diagnosis Description L97.222 Non-pressure chronic ulcer of left calf with fat layer expo L88 Pyoderma gangrenosum I87.2 Venous insufficiency (chronic) (peripheral) Modifier: sed Quantity: 1 Electronic Signature(s) Signed: 12/04/2017 4:37:40 PM By: Bonnell Public Previous Signature: 12/04/2017 8:39:26 AM Version By: Alejandro Mulling Previous Signature: 12/04/2017 4:35:32 PM Version By: Bonnell Public Previous Signature: 12/04/2017 8:31:19 AM Version By: Bonnell Public Entered By: Bonnell Public on 12/04/2017 16:37:39

## 2017-12-05 NOTE — Patient Instructions (Signed)
Stop by the lab and xray prior to leaving today. I will notify you of your results once received.   Try wearing a knee brace/sleeve for support during the day.   Consider putting extra insoles in your shoes and make sure to wear comfortable shoes when at work.  I'll be in touch soon. It was a pleasure to see you today!

## 2017-12-05 NOTE — Assessment & Plan Note (Addendum)
Chronic now for three months. Likely secondary to morbid obesity coupled with long hours walking at work. Prednisone likely contributing to ankle edema.  Discussed to add in extra insoles to the shoes, wear comfortable shoes. Check plain films today.  Check uric acid level to rule out gout.  Consider PT vs Sports medicine referral. Strongly advised he work on weight loss through diet and exercise.

## 2017-12-05 NOTE — Progress Notes (Signed)
Subjective:    Patient ID: [name], male    DOB: 10/21/1980, 37 y.o.   MRN: [id number]  HPI  [name] is a 37 year old male with a history of lower extremity edema, skin ulcer, type 2 diabetes who presents today with a chief complaint of knee and ankle pain.  He was last evaluated in mid June 2019 with complaints of left lateral and posterior patellar knee pain that began three weeks prior. He endorsed walking a lot for work. During that visit it was recommended he wear a knee sleeve for support, ice/elevate, and stretch.   Today he reports continued knee pain and also ankle pain for which he forgot to mention last time. His pain is located to the medical and lateral patella for which he describes as achy. His ankle pain is located to the lateral, medial and anterior side. He will sometimes notice his knee/extremity giving out. He has not worn a knee brace/sleeve as recommended last visit. He resumed prednisone 20 mg for which he's taking for his skin ulcer to the left lower extremity. He will slowing wean off over the next month. He's taking Tylenol 1000 mg every 8 hours with minimal improvement.    Review of Systems  Musculoskeletal: Positive for arthralgias.  Skin: Positive for wound. Negative for color change.  Neurological: Negative for numbness.       Past Medical History:  Diagnosis Date  . Blood in stool   . Depression   . Elevated blood pressure   . Hyperlipidemia   . OSA (obstructive sleep apnea) 2014  . Seasonal allergies      Social History   Socioeconomic History  . Marital status: Married    Spouse name: Not on file  . Number of children: Not on file  . Years of education: Not on file  . Highest education level: Not on file  Occupational History  . Not on file  Social Needs  . Financial resource strain: Not on file  . Food insecurity:    Worry: Not on file    Inability: Not on file  . Transportation needs:    Medical: Not on file    Non-medical: Not  on file  Tobacco Use  . Smoking status: Former Games developermoker  . Smokeless tobacco: Former NeurosurgeonUser    Quit date: 03/17/2015  . Tobacco comment[place] 9 years  Substance and Sexual Activity  . Alcohol use: Yes    Alcohol/week: 0.0 oz    Comment: soical  . Drug use: Not on file  . Sexual activity: Not on file  Lifestyle  . Physical activity:    Days per week: Not on file    Minutes per session: Not on file  . Stress: Not on file  Relationships  . Social connections:    Talks on phone: Not on file    Gets together: Not on file    Attends religious service: Not on file    Active member of club or organization: Not on file    Attends meetings of clubs or organizations: Not on file    Relationship status: Not on file  . Intimate partner violence:    Fear of current or ex partner: Not on file    Emotionally abused: Not on file    Physically abused: Not on file    Forced sexual activity: Not on file  Other Topics Concern  . Not on file  Social History Narrative   Married.  7 kids.   Works as a Magazine features editor at [employer].   Enjoys target shooting.     Past Surgical History:  Procedure Laterality Date  . SEPTOPLASTY  2007  . WRIST SURGERY      Family History  Problem Relation Age of Onset  . Alcohol abuse Paternal Aunt   . Alcohol abuse Paternal Uncle   . Stroke Paternal Uncle   . Hyperlipidemia Maternal Grandfather   . Hypertension Maternal Grandfather   . Diabetes Maternal Grandfather   . Alcohol abuse Maternal Grandfather   . Multiple sclerosis Mother   . Dementia Mother     Allergies  Allergen Reactions  . Biaxin [Clarithromycin] Other (See Comments)    Causes colitis flares  . Milk-Related Compounds Hives    Current Outpatient Medications on File Prior to Visit  Medication Sig Dispense Refill  . acetaminophen (TYLENOL) 500 MG tablet Take 1,000 mg by mouth every 6 (six) hours as needed for headache (pain).    . cetirizine (ZYRTEC) 10 MG  tablet Take 1 tablet (10 mg total) by mouth daily. 90 tablet 1  . clobetasol ointment (TEMOVATE) 0.05 % Apply to wound once daily    . ibuprofen (ADVIL,MOTRIN) 200 MG tablet Take 400-600 mg by mouth every 6 (six) hours as needed for headache (pain).    . metFORMIN (GLUCOPHAGE) 1000 MG tablet Take 1 tablet (1,000 mg total) by mouth 2 (two) times daily with a meal. 180 tablet 3  . predniSONE (DELTASONE) 10 MG tablet Start 20 mg daily for 2 weeks then decrease to 10 mg daily for 2 weeks then stop.    . ranitidine (ZANTAC) 150 MG tablet Take 150 mg by mouth 2 (two) times daily as needed for heartburn (acid reflux).     . venlafaxine XR (EFFEXOR-XR) 150 MG 24 hr capsule TAKE 1 CAPSULE BY MOUTH  DAILY WITH BREAKFAST 90 capsule 1   No current facility-administered medications on file prior to visit.     BP 130/80   Pulse 82   Temp 98 F (36.7 C) (Oral)   Ht 6' (1.829 m)   Wt (!) 355 lb 8 oz (161.3 kg)   SpO2 97%   BMI 48.21 kg/m    Objective:   Physical Exam  Constitutional: He appears well-nourished.  Cardiovascular: Normal rate and regular rhythm.  Pulses:      Dorsalis pedis pulses are 2+ on the left side.  Respiratory: Effort normal.  Musculoskeletal:       Left knee: He exhibits normal range of motion, no swelling, no erythema and no bony tenderness. No tenderness found.       Left ankle: He exhibits swelling. He exhibits normal range of motion. No tenderness.       Legs: Mild swelling to left ankle.   Skin: Skin is warm and dry.           Assessment & Plan:

## 2017-12-12 MED ORDER — PREDNISONE 20 MG TABLET
ORAL_TABLET | Freq: Every day | ORAL | 1 refills | 0.00000 days | Status: CP
Start: 2017-12-12 — End: 2018-01-07

## 2017-12-15 ENCOUNTER — Encounter: Payer: 59 | Admitting: Physician Assistant

## 2017-12-15 DIAGNOSIS — L88 Pyoderma gangrenosum: Secondary | ICD-10-CM | POA: Diagnosis not present

## 2017-12-15 DIAGNOSIS — L97222 Non-pressure chronic ulcer of left calf with fat layer exposed: Secondary | ICD-10-CM | POA: Diagnosis not present

## 2017-12-19 ENCOUNTER — Ambulatory Visit: Payer: 59 | Admitting: Physician Assistant

## 2017-12-22 ENCOUNTER — Encounter: Payer: 59 | Attending: Physician Assistant | Admitting: Physician Assistant

## 2017-12-22 ENCOUNTER — Other Ambulatory Visit
Admission: RE | Admit: 2017-12-22 | Discharge: 2017-12-22 | Disposition: A | Discharge status: Home / Self Care | Payer: 59 | Source: Other Acute Inpatient Hospital | Attending: Physician Assistant | Admitting: Physician Assistant

## 2017-12-22 DIAGNOSIS — Z9989 Dependence on other enabling machines and devices: Secondary | ICD-10-CM | POA: Diagnosis not present

## 2017-12-22 DIAGNOSIS — I872 Venous insufficiency (chronic) (peripheral): Secondary | ICD-10-CM | POA: Insufficient documentation

## 2017-12-22 DIAGNOSIS — L089 Local infection of the skin and subcutaneous tissue, unspecified: Secondary | ICD-10-CM | POA: Insufficient documentation

## 2017-12-22 DIAGNOSIS — F17200 Nicotine dependence, unspecified, uncomplicated: Secondary | ICD-10-CM | POA: Diagnosis not present

## 2017-12-22 DIAGNOSIS — I10 Essential (primary) hypertension: Secondary | ICD-10-CM | POA: Diagnosis not present

## 2017-12-22 DIAGNOSIS — L88 Pyoderma gangrenosum: Secondary | ICD-10-CM | POA: Diagnosis not present

## 2017-12-22 DIAGNOSIS — L97222 Non-pressure chronic ulcer of left calf with fat layer exposed: Secondary | ICD-10-CM | POA: Diagnosis not present

## 2017-12-22 DIAGNOSIS — B9689 Other specified bacterial agents as the cause of diseases classified elsewhere: Secondary | ICD-10-CM | POA: Diagnosis not present

## 2017-12-22 DIAGNOSIS — G4730 Sleep apnea, unspecified: Secondary | ICD-10-CM | POA: Diagnosis not present

## 2017-12-22 DIAGNOSIS — L97822 Non-pressure chronic ulcer of other part of left lower leg with fat layer exposed: Secondary | ICD-10-CM | POA: Insufficient documentation

## 2017-12-25 LAB — AEROBIC CULTURE W GRAM STAIN (SUPERFICIAL SPECIMEN)

## 2018-01-01 NOTE — Progress Notes (Addendum)
[name], [name] ([id number]) Visit Report for 12/22/2017 Chief Complaint Document Details Patient Name: [name], [name] Date of Service: 12/22/2017 8:00 AM Medical Record Number: [id number] Patient Account Number: [id number] Date of Birth/Sex: February 15, 1981 (37 y.o. M) Treating RN: Roger Shelter Primary Care Provider: [name] Other Clinician: Referring Provider: [name] Treating Provider/Extender: Melburn Hake, Jojo Geving Weeks in Treatment: 73 Information Obtained from: Patient Chief Complaint He is here in follow up evaluation for LLE pyoderma ulcer Electronic Signature(s) Signed: 12/22/2017 5:57:26 PM By: Worthy Keeler PA-C Entered By: Worthy Keeler on 12/22/2017 08:21:53 [name], [name] ([id number]) -------------------------------------------------------------------------------- HPI Details Patient Name: [name] Date of Service: 12/22/2017 8:00 AM Medical Record Number: [id number] Patient Account Number: [id number] Date of Birth/Sex: Mar 24, 1981 (37 y.o. M) Treating RN: Roger Shelter Primary Care Provider: [name] Other Clinician: Referring Provider: [name] Treating Provider/Extender: Melburn Hake, Aislyn Hayse Weeks in Treatment: 29 History of Present Illness HPI Description: 12/04/16; 37 year old man who comes into the clinic today for review of a wound on the posterior left calf. He tells me that is been there for about a year. He is not a diabetic he does smoke half a pack per day. He was seen in the ER on 11/20/16 felt to have cellulitis around the wound and was given clindamycin. An x-ray did not show osteomyelitis. The patient initially tells me that he has a milk allergy that sets off a pruritic itching rash on his lower legs which she scratches incessantly and he thinks that's what may have set up the wound. He has been using various topical antibiotics and ointments without any effect. He works in a [employer] and is on his feet all day. He does not have a  prior history of wounds however he does have the rash on both lower legs the right arm and the ventral aspect of his left arm. These are excoriations and clearly have had scratching however there are of macular looking areas on both legs including a substantial larger area on the right leg. This does not have an underlying open area. There is no blistering. The patient tells me that 2 years ago in [place] in response to the rash on his legs he saw a dermatologist who told him he had a condition which may be pyoderma gangrenosum although I may be putting words into his mouth. He seemed to recognize this. 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 He is not in any treatment for this. He's had no recent travel 12/11/16; the patient arrives today with his wound and roughly the same condition we've been using silver alginate this is a deep punched out wound with some surrounding erythema but no tenderness. Biopsy I did did not show confirmed pyoderma gangrenosum suggested nonspecific inflammation and vasculitis but does not provide an actual description of what was seen by the pathologist. I'm really not able to understand this We have also received information from the patient's dermatologist in [place] notes from April 2016. This was a doctor Agarwal- antal. 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 He was prescribed topical steroid high potency under occlusion which helped but at this point the patient did not have a deep punched out wound. 12/18/16; the patient's wound is larger in terms of surface area however this surface looks better and there is less depth. The surrounding erythema also is better. The patient states that the wrap we put on came off 2 days ago when he has been using  his compression stockings. He we are in the process of getting a dermatology consult. 12/26/16 on evaluation today patient's left lower extremity wound shows evidence  of infection with surrounding erythema noted. He has been tolerating the dressing changes but states that he has noted more discomfort. There is a larger area of erythema surrounding the wound. No fevers, chills, nausea, or vomiting noted at this time. With that being said the wound still does have slough covering the surface. He is not allergic to any medication that he is aware of at this point. In regard to his right lower extremity he had several regions that are erythematous and pruritic he wonders if there's anything we can do to help that. 01/02/17 I reviewed patient's wound culture which was obtained his visit last week. He was placed on doxycycline at that point. Unfortunately that does not appear to be an antibiotic that would likely help with the situation however the pseudomonas noted on culture is sensitive to Cipro. Also unfortunately patient's wound seems to have a large compared to last week's evaluation. Not severely so but there are definitely increased measurements in general. He is continuing to have discomfort as well he writes this to be a seven out of 10. In fact he would prefer me not to perform any debridement today due to the fact that he is having discomfort and considering he has an active infection on the little reluctant to do so anyway. No fevers, chills, nausea, or vomiting noted at this time. 01/08/17; patient seems dermatology on September 5. I suspect dermatology will want the slides from the biopsy I did sent to their pathologist. I'm not sure if there is a way we can expedite that. In any case the culture I did before I left on vacation 3 weeks ago showed Pseudomonas he was given 10 days of Cipro and per her description of her intake nurses is actually somewhat better this week although the wound is quite a bit bigger than I remember the last time I saw this. He still has 3 more days of Cipro COPPER, BASNETT ([id number]) 01/21/17; dermatology appointment tomorrow. He  has completed the ciprofloxacin for Pseudomonas. Surface of the wound looks better however he is had some deterioration in the lesions on his right leg. Meantime the left lateral leg wound we will continue with sample 01/29/17; patient had his dermatology appointment but I can't yet see that note. He is completed his antibiotics. The wound is more superficial but considerably larger in circumferential area than when he came in. This is in his left lateral calf. He also has swollen erythematous areas with superficial wounds on the right leg and small papular areas on both arms. There apparently areas in her his upper thighs and buttocks I did not look at those. Dermatology biopsied the right leg. Hopefully will have their input next week. 02/05/17; patient went back to see his dermatologist who told him that he had a "scratching problem" as well as staph. He is now on a 30 day course of doxycycline and I believe she gave him triamcinolone cream to the right leg areas to help with the itching [not exactly sure but probably triamcinolone]. She apparently looked at the left lateral leg wound although this was not rebiopsied and I think felt to be ultimately part of the same pathogenesis. He is using sample border foam and changing nevus himself. He now has a new open area on the right posterior leg which was his biopsy site I don't  have any of the dermatology notes 02/12/17; we put the patient in compression last week with SANTYL to the wound on the left leg and the biopsy. Edema is much better and the depth of the wound is now at level of skin. Area is still the same oBiopsy site on the right lateral leg we've also been using santyl with a border foam dressing and he is changing this himself. 02/19/17; Using silver alginate started last week to both the substantial left leg wound and the biopsy site on the right wound. He is tolerating compression well. Has a an appointment with his primary M.D. tomorrow  wondering about diuretics although I'm wondering if the edema problem is actually lymphedema 02/26/17; the patient has been to see his primary doctor Dr. Jerrel Ivory at [facility] our primary care. She started him on Lasix 20 mg and this seems to have helped with the edema. However we are not making substantial change with the left lateral calf wound and inflammation. The biopsy site on the right leg also looks stable but not really all that different. 03/12/17; the patient has been to see vein and vascular Dr. Lucky Cowboy. He has had venous reflux studies I have not reviewed these. I did get a call from his dermatology office. 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 They therefore recommended clobetasol and tetracycline i.e. 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 03/26/17; apparently the patient just had reflux studies not an appointment with Dr. dew. She arrives in clinic today having applied clobetasol for 2-3 weeks. He notes over the last 2-3 days excessive drainage having to change the dressing 3-4 times a day and also expanding erythema. 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 Lillia Carmel at [facility] dermatology locally who kindly saw him at my request. 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 My concern was the degree of erythema medially extending from his left leg wound which was either inflammation from  pyoderma or cellulitis. I put him on Augmentin however culture of the wound showed Pseudomonas which is quinolone sensitive. I really don't believe he has cellulitis however in view of everything I will continue and give him a course of Cipro. He is also on doxycycline as an immune modulator for the pyoderma. In addition to his original wound on the left lateral leg with surrounding erythema he has a wound on the right posterior calf which was an original biopsy site done by dermatology. 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 Saw Dr. Lillia Carmel yesterday. He has been using topical antibiotics to both wound areas his original wound on the left and the biopsies/pathergy area on the right. There is definitely some improvement in the inflammation around the wound on the right although the patient states he has increasing sensitivity of the wounds. He is on prednisone 60 and doxycycline 1 as prescribed by Dr. Lillia Carmel. He is covering the topical  antibiotic with gauze and putting this in his own compression stocks and changing this daily. He states that Dr. Lottie Rater did a culture of the left leg wound yesterday 05/07/17; pyoderma gangrenosum. The patient saw Dr. Lillia Carmel yesterday and has a follow-up with her in one month. He is still using topical antibiotics to both wounds although he can't recall exactly what type. He is still on prednisone 60 mg. Dr. Lillia Carmel stated that the doxycycline could stop if we were in agreement. 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 The right medial leg was induced by biopsy/pathergy. The area on the right is essentially healed. 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 The right medial leg has closed and  remains closed. He is still on prednisone 60. oHe tells me he missed his last dermatology appointment with Dr. Lillia Carmel but will make another appointment. He reports that her blood sugar at a recent screen in [place] was high 200's. He was 180 today. He is more cushingoid blood pressure is [name], [name] ([id number]) up a bit. I think he is going to require still much longer prednisone perhaps another 3 months before attempting to taper. In the meantime his wound is a lot better. Smaller. He is cleaning this off daily and applying topical antibiotics. When he was last in the clinic I thought about changing to [facility] and actually put in a couple of calls to dermatology although probably not during their business hours. 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 He continues on oral prednisone. He has been using triple antibiotic ointment. There is surface debris and we will transition to [facility] and have him return in 2 weeks. He has lost 30 pounds since his last appointment with lifestyle modification. He may benefit from topical steroid cream for treatment this can be considered at a later date. 08/22/17 on evaluation today patient appears to actually be doing rather well in regard to his left lateral lower extremity ulcer. He has actually been managed by Dr. Dellia Nims most recently. Patient is currently on oral steroids at this time. This seems to have been of benefit for him. Nonetheless his last visit was actually with Leah on 08/06/17. Currently he is not utilizing any topical steroid creams although this could be of benefit as well. No fevers, chills, nausea, or vomiting noted at this time. 09/05/17 on evaluation today patient appears to be doing better in regard to his left lateral lower extremity ulcer. He has been tolerating the dressing changes without complication.  He is using Santyl with good effect. Overall I'm very pleased with how things are standing at this point. Patient likewise is happy that this is doing better. 09/19/17 on evaluation today patient actually appears to be doing rather well in regard to his left lateral lower extremity ulcer. 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 He's not having any significant pain. 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 He has been tolerating the Santyl without complication and in general I feel like he's making good  progress. 10/17/17 on evaluation today patient appears to be doing very well in regard to his left lateral lower surety ulcer. He has been tolerating the dressing changes without complication. There does not appear to be any evidence of infection he's alternating the Santyl and the triple antibiotic ointment every other day this seems to be doing well for him. 11/03/17 on evaluation today patient appears to be doing very well in regard to his left lateral lower extremity ulcer. He is been tolerating the dressing changes without complication which is good news. Fortunately there does not appear to be any evidence of infection which is also great news. Overall is doing excellent they are starting to taper down on the prednisone is down to 40 mg at this point it also started topical clobetasol for him. 11/17/17 on evaluation today patient appears to be doing well in regard to his left lateral lower surety ulcer. He's been tolerating the dressing changes without complication. He does note that he is having no pain, no excessive drainage or discharge, and overall he feels like things are going about how he would expect and hope they would. Overall he seems to have no evidence of infection at this time in my opinion which is  good news. 12/04/17-He is seen in follow-up evaluation for right lateral lower extremity ulcer. He has been applying topical steroid cream. Today's measurement show slight increase in size. Over the next 2 weeks we will transition to every other day Santyl and steroid cream. He has been encouraged to monitor for changes and notify clinic with any concerns 12/15/17 on evaluation today patient's left lateral motion the ulcer and fortunately is doing worse again at this point. This just since last week to this week has close to doubled in size according to the patient. I did not seeing last week's I do not have a visual to compare this to in our system was also down so we do not have all the charts and at this point. Nonetheless it does have me somewhat concerned in regard to the fact that again he was worried enough about it he has contact the dermatology that placed them back on the full strength, 50 mg a day of the prednisone that he was taken previous. He continues to alternate using clobetasol along with Santyl at this point. He is obviously somewhat frustrated. 12/22/17 on evaluation today patient appears to be doing a little worse compared to last evaluation. Unfortunately the wound is a little deeper and slightly larger than the last week's evaluation. With that being said he has made some progress in regard to the irritation surrounding at this time unfortunately despite that progress that's been made he still has a significant issue going on here. 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 For that reason I discussed with him today the possibility of perform a wound culture to ensure there's nothing overtly infected. Electronic Signature(s) Signed: 12/22/2017 5:57:26 PM By: Hedy Camara, Aldrich ([id number]) Entered By: Worthy Keeler  on 12/22/2017 08:41:19 [name], [name] ([id number]) -------------------------------------------------------------------------------- Physical Exam Details Patient Name: [name] Date of Service: 12/22/2017 8:00 AM Medical Record Number: [id number] Patient Account Number: [id number] Date of Birth/Sex: 1980/12/08 (36 y.o. M) Treating RN: Roger Shelter Primary Care Provider: [name] Other Clinician: Referring Provider: [name] Treating Provider/Extender: Melburn Hake, Marita Burnsed Weeks in Treatment: 35 Constitutional  Well-nourished and well-hydrated in no acute distress. Respiratory normal breathing without difficulty. clear to auscultation bilaterally. Cardiovascular regular rate and rhythm with normal S1, S2. Psychiatric this patient is able to make decisions and demonstrates good insight into disease process. Alert and Oriented x 3. pleasant and cooperative. Notes On inspection at this time the patient seems to be doing this in regard to the amount of slough and the wound itself. That has cleared up quite a bit unfortunately in the process this is also quite a bit deeper than even last week when I saw him. Unfortunately that is not the trend that we are really looking for at this time. The wound is also larger in size. He does state that the tingling that normally indicates that it's going to grow and worsen has been better which is at least good news. With that being said he is back on the prednisone full-strength at this point. Electronic Signature(s) Signed: 12/22/2017 5:57:26 PM By: Worthy Keeler PA-C Entered By: Worthy Keeler on 12/22/2017 08:42:16 ZEV, [name] ([id number]) -------------------------------------------------------------------------------- Physician Orders Details Patient Name: [name] Date of Service: 12/22/2017 8:00 AM Medical Record Number: [id number] Patient Account Number: [id number] Date of Birth/Sex: 1980-08-03 (36 y.o. M) Treating RN:  Roger Shelter Primary Care Provider: [name] Other Clinician: Referring Provider: [name] Treating Provider/Extender: Melburn Hake, Aycen Porreca Weeks in Treatment: 71 Verbal / Phone Orders: No Diagnosis Coding ICD-10 Coding Code Description [phone] Non-pressure chronic ulcer of left calf with fat layer exposed L88 Pyoderma gangrenosum I87.2 Venous insufficiency (chronic) (peripheral) Wound Cleansing Wound #1 Left,Lateral Lower Leg o Clean wound with Normal Saline. o Cleanse wound with mild soap and water o May Shower, gently pat wound dry prior to applying new dressing. Anesthetic (add to Medication List) Wound #1 Left,Lateral Lower Leg o Topical Lidocaine 4% cream applied to wound bed prior to debridement (In Clinic Only). Skin Barriers/Peri-Wound Care Wound #1 Left,Lateral Lower Leg o Triamcinolone Acetonide Ointment (TCA) - apply to wound o Other: - uses clabetazole at home, alternate this cream and santyl on wound Primary Wound Dressing Wound #1 Left,Lateral Lower Leg o Santyl Ointment - apply to wound and alternate days with TCA (steroid cream) o Other: - apply to wound and alternate days with Santyl Secondary Dressing Wound #1 Left,Lateral Lower Leg o Boardered Foam Dressing Dressing Change Frequency Wound #1 Left,Lateral Lower Leg o Change dressing every day. Follow-up Appointments Wound #1 Left,Lateral Lower Leg o Return Appointment in 2 weeks. Edema Control Wound #1 Left,Lateral Lower Leg o Patient to wear own compression stockings Macchia, Toren ([id number]) Additional Orders / Instructions Wound #1 Left,Lateral Lower Leg o Stop Smoking o Increase protein intake. Laboratory o Bacteria identified in Wound by Culture (MICRO) - left lower leg oooo LOINC Code: 9937-1 oooo Convenience Name: Wound culture routine Patient Medications Allergies: milk, Biaxin, seasonal Notifications Medication Indication Start End Bactrim  DS DOSE 1 - oral 800 mg-160 mg tablet - 1 tablet oral taken 2 times a day for 14 days Electronic Signature(s) Signed: 12/30/2017 10:27:18 PM By: Worthy Keeler PA-C Previous Signature: 12/22/2017 5:57:26 PM Version By: Worthy Keeler PA-C Previous Signature: 12/23/2017 8:47:36 AM Version By: Roger Shelter Entered By: Worthy Keeler on 12/30/2017 22:27:17 [name] ([id number]) -------------------------------------------------------------------------------- Problem List Details Patient Name: [name] Date of Service: 12/22/2017 8:00 AM Medical Record Number: [id number] Patient Account Number: [id number] Date of Birth/Sex: Aug 22, 1980 (36 y.o. 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 M) Treating RN: Roger Shelter Primary Care Provider: [name] Other Clinician: Referring Provider: [name] Treating Provider/Extender: Melburn Hake,  Raiyah Speakman Weeks in Treatment: 40 Subjective Chief Complaint Information obtained from Patient He is here in follow up evaluation for LLE pyoderma ulcer History of Present Illness (HPI) 12/04/16; 37 year old man who comes into the clinic today for review of a wound on the posterior left calf. He tells me that is been there for about a year. He is not a diabetic he does smoke half a pack per day. He was seen in the ER on 11/20/16 felt to have cellulitis around the wound and was given clindamycin. An x-ray did not show osteomyelitis. The patient initially tells me that he has a milk allergy that sets off a pruritic itching rash on his lower legs which she scratches incessantly and he thinks that's what may have set up the wound. He has been using various topical antibiotics and ointments without any effect. He works in a [employer] and is on his feet all day. He does not have a prior history of wounds however he does have the rash on both lower legs the right arm and the ventral aspect of his left arm. These are excoriations and clearly have had scratching however there are of macular looking areas on both legs including a substantial larger area on the right leg. This does not have an underlying open area. There is no blistering. The patient tells me that 2 years ago in [place] in response to the rash on his legs he saw a dermatologist who told him he had a condition which may be pyoderma gangrenosum although I may be putting words into his mouth. He seemed to recognize this. 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 He is not in any treatment for this. He's had no recent travel 12/11/16; the patient arrives today with his wound and roughly the same condition we've been using silver alginate this is a deep punched out wound with some surrounding erythema but no tenderness. Biopsy I did did not show confirmed pyoderma gangrenosum suggested nonspecific  inflammation and vasculitis but does not provide an actual description of what was seen by the pathologist. I'm really not able to understand this We have also received information from the patient's dermatologist in [place] notes from April 2016. This was a doctor Agarwal- antal. 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 He was prescribed topical steroid high potency under occlusion which helped but at this point the patient did not have a deep punched out wound. 12/18/16; the patient's wound is larger in terms of surface area however this surface looks better and there is less depth. The surrounding erythema also is better. The patient states that the  wrap we put on came off 2 days ago when he has been using his compression stockings. He we are in the process of getting a dermatology consult. 12/26/16 on evaluation today patient's left lower extremity wound shows evidence of infection with surrounding erythema noted. He has been tolerating the dressing changes but states that he has noted more discomfort. There is a larger area of erythema surrounding the wound. No fevers, chills, nausea, or vomiting noted at this time. With that being said the wound still does have slough covering the surface. He is not allergic to any medication that he is aware of at this point. In regard to his right lower extremity he had several regions that are erythematous and pruritic he wonders if there's anything we can do to help that. 01/02/17 I reviewed patient's wound culture which was obtained his visit last week. He was placed on doxycycline at that point. Unfortunately that does not appear to be an antibiotic that would likely help with the situation however the pseudomonas noted on culture is sensitive to Cipro. Also unfortunately patient's wound seems to have a large compared to last week's evaluation. Not severely so but there are definitely increased measurements in general. He is continuing to have  discomfort as well he writes this to be a seven out of 10. In fact he would prefer me not to perform any debridement today due to the fact that he is having discomfort and considering he has an active infection on the little reluctant to do so anyway. No fevers, [name], [name] ([id number]) chills, nausea, or vomiting noted at this time. 01/08/17; patient seems dermatology on September 5. I suspect dermatology will want the slides from the biopsy I did sent to their pathologist. I'm not sure if there is a way we can expedite that. In any case the culture I did before I left on vacation 3 weeks ago showed Pseudomonas he was given 10 days of Cipro and per her description of her intake nurses is actually somewhat better this week although the wound is quite a bit bigger than I remember the last time I saw this. He still has 3 more days of Cipro 01/21/17; dermatology appointment tomorrow. He has completed the ciprofloxacin for Pseudomonas. Surface of the wound looks better however he is had some deterioration in the lesions on his right leg. Meantime the left lateral leg wound we will continue with sample 01/29/17; patient had his dermatology appointment but I can't yet see that note. He is completed his antibiotics. The wound is more superficial but considerably larger in circumferential area than when he came in. This is in his left lateral calf. He also has swollen erythematous areas with superficial wounds on the right leg and small papular areas on both arms. There apparently areas in her his upper thighs and buttocks I did not look at those. Dermatology biopsied the right leg. Hopefully will have their input next week. 02/05/17; patient went back to see his dermatologist who told him that he had a "scratching problem" as well as staph. He is now on a 30 day course of doxycycline and I believe she gave him triamcinolone cream to the right leg areas to help with the itching [not exactly sure but probably  triamcinolone]. She apparently looked at the left lateral leg wound although this was not rebiopsied and I think felt to be ultimately part of the same pathogenesis. He is using sample border foam and changing nevus himself. He now has a new  open area on the right posterior leg which was his biopsy site I don't have any of the dermatology notes 02/12/17; we put the patient in compression last week with SANTYL to the wound on the left leg and the biopsy. Edema is much better and the depth of the wound is now at level of skin. Area is still the same Biopsy site on the right lateral leg we've also been using santyl with a border foam dressing and he is changing this himself. 02/19/17; Using silver alginate started last week to both the substantial left leg wound and the biopsy site on the right wound. He is tolerating compression well. Has a an appointment with his primary M.D. tomorrow wondering about diuretics although I'm wondering if the edema problem is actually lymphedema 02/26/17; the patient has been to see his primary doctor Dr. Jerrel Ivory at [facility] our primary care. She started him on Lasix 20 mg and this seems to have helped with the edema. However we are not making substantial change with the left lateral calf wound and inflammation. The biopsy site on the right leg also looks stable but not really all that different. 03/12/17; the patient has been to see vein and vascular Dr. Lucky Cowboy. He has had venous reflux studies I have not reviewed these. I did get a call from his dermatology office. 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 They therefore recommended clobetasol and tetracycline i.e. aggressive treatment for possible  pyoderma gangrenosum. 03/26/17; apparently the patient just had reflux studies not an appointment with Dr. dew. She arrives in clinic today having applied clobetasol for 2-3 weeks. He notes over the last 2-3 days excessive drainage having to change the dressing 3-4 times a day and also expanding erythema. 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 Lillia Carmel at [facility] dermatology locally who kindly saw him at my request. 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 My concern was the degree of erythema medially extending from his left leg wound which was either inflammation from pyoderma or cellulitis. I put him on Augmentin however culture of the wound showed Pseudomonas which is quinolone sensitive. I really don't believe he has cellulitis however in view of everything I will continue and give him a course of Cipro. He is also on doxycycline as an immune modulator for the pyoderma. In addition to his original wound on the left lateral leg with surrounding erythema he has a wound on the right posterior calf which was an original biopsy site done by dermatology. 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 Saw Dr. Lillia Carmel yesterday. He has been using topical antibiotics to both wound areas his original wound on the left and the biopsies/pathergy area on the right. There is definitely some improvement in the inflammation around the wound on the right although the patient states he has increasing sensitivity of the wounds. He is on prednisone 60  and doxycycline 1 as prescribed by Dr. Lillia Carmel. He is covering the topical antibiotic with  gauze and putting this in his own compression stocks and changing this daily. He states that Dr. Lottie Rater did a culture of the left leg wound yesterday 05/07/17; pyoderma gangrenosum. The patient saw Dr. Lillia Carmel yesterday and has a follow-up with her in one month. He is still using topical antibiotics to both wounds although he can't recall exactly what type. He is still on prednisone 60 mg. Dr. Lillia Carmel stated that the doxycycline could stop if we were in agreement. 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 The right medial leg was induced by [name] ([id number]) biopsy/pathergy. The area on the right is essentially healed. 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 The right medial leg has closed and remains closed. He is still on prednisone 60. He tells me he missed his last dermatology appointment with Dr. Lillia Carmel but will make another appointment. He reports that her blood sugar at a recent screen in [place] was high 200's. He was 180 today. 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 I think he is going to require still much longer prednisone perhaps another 3 months before attempting to taper. In the meantime his wound is a lot better. Smaller. He is cleaning this off daily and applying topical antibiotics. When he was last in the clinic I thought about changing to [facility] and actually put in a couple of calls to dermatology although probably not during their business hours. 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 He continues on oral prednisone. He has been using triple antibiotic ointment. There is surface debris and we will transition to  [facility] and have him return in 2 weeks. He has lost 30 pounds since his last appointment with lifestyle modification. He may benefit from topical steroid cream for treatment this can be considered at a later date. 08/22/17 on evaluation today patient appears to actually be doing rather well in regard to his left lateral lower extremity ulcer. He has actually been managed by Dr. Dellia Nims most recently. Patient is currently on oral steroids at this time. This seems to have been of benefit for him. Nonetheless his last visit was actually with Leah on 08/06/17. Currently he is not utilizing any topical steroid creams although this could be of benefit as well. No fevers, chills, nausea, or vomiting noted at this time. 09/05/17 on evaluation today patient appears to be doing better in regard to his left lateral lower extremity ulcer. He has been tolerating the dressing changes without complication. He is using Santyl with good effect. Overall I'm very pleased with how things are standing at this point. Patient likewise is happy that this is doing better. 09/19/17 on evaluation today patient actually appears to be doing rather well in regard to his left lateral lower extremity ulcer. 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 He's not having any significant pain. 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 He has been tolerating  the Santyl without complication and in general I feel like he's making good progress. 10/17/17 on evaluation today patient appears to be doing very well in regard to his left lateral lower surety ulcer. He has been tolerating the dressing changes without complication. There does not appear to be any evidence of infection he's alternating the Santyl and the triple antibiotic ointment every other day this seems  to be doing well for him. 11/03/17 on evaluation today patient appears to be doing very well in regard to his left lateral lower extremity ulcer. He is been tolerating the dressing changes without complication which is good news. Fortunately there does not appear to be any evidence of infection which is also great news. Overall is doing excellent they are starting to taper down on the prednisone is down to 40 mg at this point it also started topical clobetasol for him. 11/17/17 on evaluation today patient appears to be doing well in regard to his left lateral lower surety ulcer. He's been tolerating the dressing changes without complication. He does note that he is having no pain, no excessive drainage or discharge, and overall he feels like things are going about how he would expect and hope they would. Overall he seems to have no evidence of infection at this time in my opinion which is good news. 12/04/17-He is seen in follow-up evaluation for right lateral lower extremity ulcer. He has been applying topical steroid cream. Today's measurement show slight increase in size. Over the next 2 weeks we will transition to every other day Santyl and steroid cream. He has been encouraged to monitor for changes and notify clinic with any concerns 12/15/17 on evaluation today patient's left lateral motion the ulcer and fortunately is doing worse again at this point. This just since last week to this week has close to doubled in size according to the patient. I did not seeing last week's I do not have a visual to compare this to in our system was also down so we do not have all the charts and at this point. Nonetheless it does have me somewhat concerned in regard to the fact that again he was worried enough about it he has contact the dermatology that placed them back on the full strength, 50 mg a day of the prednisone that he was taken previous. He continues to alternate using clobetasol along with Santyl at  this point. He is obviously somewhat frustrated. 12/22/17 on evaluation today patient appears to be doing a little worse compared to last evaluation. Unfortunately the wound is a little deeper and slightly larger than the last week's evaluation. With that being said he has made some progress in regard to the irritation surrounding at this time unfortunately despite that progress that's been made he still has a significant issue going on here. I'm not certain that he is having really any true infection at this time although with the Pyoderma gangrenosum it can [name], [name] ([id number]) sometimes be difficult to differentiate infection versus just inflammation. For that reason I discussed with him today the possibility of perform a wound culture to ensure there's nothing overtly infected. Patient History Information obtained from Patient. Family History Diabetes - Mother,Maternal Grandparents, Heart Disease - Mother,Maternal Grandparents, Hereditary Spherocytosis - Siblings, Hypertension - Maternal Grandparents, Stroke - Siblings, No family history of Cancer, Kidney Disease, Lung Disease, Seizures, Thyroid Problems, Tuberculosis. Social History Current some day smoker, Marital Status - Married, Alcohol Use - Rarely, Drug Use - No History, Caffeine Use -  Moderate. Review of Systems (ROS) Constitutional Symptoms (General Health) Denies complaints or symptoms of Fever, Chills. Respiratory The patient has no complaints or symptoms. Cardiovascular The patient has no complaints or symptoms. Psychiatric The patient has no complaints or symptoms. Objective Constitutional Well-nourished and well-hydrated in no acute distress. Vitals Time Taken: 8:20 AM, Height: 71 in, Weight: 338 lbs, BMI: 47.1, Temperature: 98.2 F, Pulse: 79 bpm, Respiratory Rate: 18 breaths/min, Blood Pressure: 130/76 mmHg. Respiratory normal breathing without difficulty. clear to auscultation  bilaterally. Cardiovascular regular rate and rhythm with normal S1, S2. Psychiatric this patient is able to make decisions and demonstrates good insight into disease process. Alert and Oriented x 3. pleasant and cooperative. General Notes: On inspection at this time the patient seems to be doing this in regard to the amount of slough and the wound itself. That has cleared up quite a bit unfortunately in the process this is also quite a bit deeper than even last week when I saw him. Unfortunately that is not the trend that we are really looking for at this time. The wound is also larger in size. He does state that the tingling that normally indicates that it's going to grow and worsen has been better which is at least good news. With that being said he is back on the prednisone full-strength at this point. Integumentary (Hair, Skin) [name], [name] ([id number]) Wound #1 status is Open. Original cause of wound was Gradually Appeared. The wound is located on the Left,Lateral Lower Leg. The wound measures 2.1cm length x 3cm width x 0.5cm depth; 4.948cm^2 area and 2.474cm^3 volume. There is Fat Layer (Subcutaneous Tissue) Exposed exposed. There is no tunneling or undermining noted. There is a large amount of purulent drainage noted. The wound margin is distinct with the outline attached to the wound base. There is medium (34- 66%) red granulation within the wound bed. There is a medium (34-66%) amount of necrotic tissue within the wound bed including Adherent Slough. The periwound skin appearance exhibited: Induration, Maceration, Hemosiderin Staining, Erythema. The periwound skin appearance did not exhibit: Callus, Crepitus, Excoriation, Rash, Scarring, Dry/Scaly, Atrophie Blanche, Cyanosis, Ecchymosis, Mottled, Pallor, Rubor. The surrounding wound skin color is noted with erythema which is circumferential. Periwound temperature was noted as No Abnormality. The periwound has tenderness on  palpation. 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 Cleanse wound with mild soap and water May Shower, gently pat wound dry prior to applying new dressing. Anesthetic (add to Medication List): Wound #1 Left,Lateral Lower Leg: Topical Lidocaine 4% cream applied to wound bed prior to debridement (In Clinic Only). Skin Barriers/Peri-Wound Care: Wound #1 Left,Lateral Lower Leg: Triamcinolone Acetonide Ointment (TCA) - apply to wound Other: - uses clabetazole at home, alternate this cream and santyl on wound Primary Wound Dressing: Wound #1 Left,Lateral Lower Leg: Santyl Ointment - apply to wound and alternate days with TCA (steroid cream) Other: - apply to wound and alternate days with Santyl Secondary Dressing: Wound #1 Left,Lateral Lower Leg: Boardered Foam Dressing Dressing Change Frequency: Wound #1 Left,Lateral Lower Leg: Change dressing every day. Follow-up Appointments: Wound #1 Left,Lateral Lower Leg: Return Appointment in 2 weeks. Edema Control: Wound #1 Left,Lateral Lower Leg: Patient to wear own compression stockings Additional Orders / InstructionsANDYN, [name] ([id number]) Wound #1 Left,Lateral Lower Leg: Stop Smoking Increase protein intake. Laboratory ordered were: Wound culture routine - left  lower leg The following medication(s) was prescribed: Bactrim DS oral 800 mg-160 mg tablet 1 1 tablet oral taken 2 times a day for 14 days I am going to suggest at this point that we actually go ahead and continue with the Current wound care measures for the next week. The patient is in agreement with the plan. We will subsequently see him back for reevaluation in two weeks time to see were things stand. He is in agreement with that plan as well. If anything  changes or worsens he will let me know and depending on the results of the wound culture we will make any adjustments as necessary. Please see above for specific wound care orders. We will see patient for re-evaluation in 2 week(s) here in the clinic. If anything worsens or changes patient will contact our office for additional recommendations. Electronic Signature(s) Signed: 03/12/2018 3:24:21 AM By: Worthy Keeler PA-C Previous Signature: 12/22/2017 5:57:26 PM Version By: Worthy Keeler PA-C Entered By: Worthy Keeler on 12/30/2017 22:28:38 [name], [name] ([id number]) -------------------------------------------------------------------------------- ROS/PFSH Details Patient Name: [name] Date of Service: 12/22/2017 8:00 AM Medical Record Number: [id number] Patient Account Number: [id number] Date of Birth/Sex: 1980/11/21 (36 y.o. M) Treating RN: Roger Shelter Primary Care Provider: [name] Other Clinician: Referring Provider: [name] Treating Provider/Extender: Melburn Hake, Cartina Brousseau Weeks in Treatment: 1 Information Obtained From Patient Wound History Do you currently have one or more open woundso Yes How many open wounds do you currently haveo 1 Approximately how long have you had your woundso 1 year How have you been treating your wound(s) until nowo neosporin Has your wound(s) ever healed and then re-openedo No Have you had any lab work done in the past montho No Have you tested positive for an antibiotic resistant organism (MRSA, VRE)o No Have you tested positive for osteomyelitis (bone infection)o No Have you had any tests for circulation on your legso No Have you had other problems associated with your woundso Infection, Swelling Constitutional Symptoms (General Health) Complaints and Symptoms: Negative for: Fever; Chills Eyes Medical History: Negative for: Cataracts; Glaucoma; Optic Neuritis Ear/Nose/Mouth/Throat Medical History: Negative for: Chronic  sinus problems/congestion; Middle ear problems Hematologic/Lymphatic Medical History: Negative for: Anemia; Hemophilia; Human Immunodeficiency Virus; Lymphedema; Sickle Cell Disease Respiratory Complaints and Symptoms: No Complaints or Symptoms Medical History: Positive for: Sleep Apnea - cpap Negative for: Aspiration; Asthma; Chronic Obstructive Pulmonary Disease (COPD); Pneumothorax Cardiovascular Complaints and Symptoms: No Complaints or Symptoms Medical History: Positive for: Hypertension Sweeney, Nikkolas ([id number]) Negative for: Angina; Arrhythmia; Congestive Heart Failure; Coronary Artery Disease; Deep Vein Thrombosis; Hypotension; Myocardial Infarction; Peripheral Arterial Disease; Peripheral Venous Disease; Phlebitis; Vasculitis Gastrointestinal Medical History: Positive for: Colitis Endocrine Medical History: Negative for: Type I Diabetes; Type II Diabetes Genitourinary Medical History: Negative for: End Stage Renal Disease Immunological Medical History: Negative for: Lupus Erythematosus; Raynaudos; Scleroderma Integumentary (Skin) Medical History: Negative for: History of Burn; History of pressure wounds Musculoskeletal Medical History: Negative for: Gout; Rheumatoid Arthritis; Osteoarthritis; Osteomyelitis Neurologic Medical History: Negative for: Dementia; Neuropathy; Quadriplegia; Paraplegia; Seizure Disorder Oncologic Medical History: Negative for: Received Chemotherapy; Received Radiation Psychiatric Complaints and Symptoms: No Complaints or Symptoms Medical History: Negative for: Anorexia/bulimia; Confinement Anxiety Immunizations Pneumococcal Vaccine: Received Pneumococcal Vaccination: No Implantable Devices Family and Social History [name], CHAMBERLIN ([id number]) Cancer: No; Diabetes: Yes - Mother,Maternal Grandparents; Heart Disease: Yes - Mother,Maternal Grandparents; Hereditary Spherocytosis: Yes - Siblings; Hypertension: Yes - Maternal  Grandparents; Kidney Disease: No; Lung Disease: No; Seizures: No; Stroke: Yes - Siblings; Thyroid Problems: No;  Tuberculosis: No; Current some day smoker; Marital Status - Married; Alcohol Use: Rarely; Drug Use: No History; Caffeine Use: Moderate; Financial Concerns: No; Food, Clothing or Shelter Needs: No; Support System Lacking: No; Transportation Concerns: No; Advanced Directives: No; Patient does not want information on Advanced Directives; Living Will: No Physician Affirmation I have reviewed and agree with the above information. Electronic Signature(s) Signed: 12/22/2017 5:57:26 PM By: Worthy Keeler PA-C Signed: 12/23/2017 8:47:36 AM By: Roger Shelter Entered By: Worthy Keeler on 12/22/2017 08:41:42 DIANDRE, MERICA ([id number]) -------------------------------------------------------------------------------- SuperBill Details Patient Name: [name] Date of Service: 12/22/2017 Medical Record Number: [id number] Patient Account Number: [id number] Date of Birth/Sex: Jun 25, 1980 (36 y.o. 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

## 2018-01-02 NOTE — Progress Notes (Signed)
EIVIN, MASCIO ([id number]) Visit Report for 12/22/2017 Arrival Information Details Patient Name: WAGNER, TANZI Date of Service: 12/22/2017 8:00 AM Medical Record Number: [id number] Patient Account Number: [id number] Date of Birth/Sex: 09-05-1980 (37 y.o. M) Treating RN: Ahmed Prima Primary Care Ariyah Sedlack: [name] Other Clinician: Referring Grabiela Wohlford: [name] Treating Elye Harmsen/Extender: Melburn Hake, [name]: 58 Visit Information History Since Last Visit All ordered tests and consults were completed: No Patient Arrived: Ambulatory Added or deleted any medications: No Arrival Time: 08:11 Any new allergies or adverse reactions: No Accompanied By: self Had a fall or experienced change in No Transfer Assistance: None activities of daily living that may affect Patient Identification Verified: Yes risk of falls: Secondary Verification Process Completed: Yes Signs or symptoms of abuse/neglect since last visito No Patient Requires Transmission-Based No Hospitalized since last visit: No Precautions: Implantable device outside of the clinic excluding No Patient Has Alerts: No cellular tissue based products placed in the center since last visit: Has Dressing in Place as Prescribed: Yes Pain Present Now: Yes Electronic Signature(s) Signed: 12/22/2017 4:59:33 PM By: Alric Quan Entered By: Alric Quan on 12/22/2017 08:12:32 [name] ([id number]) -------------------------------------------------------------------------------- Clinic Level of Care Assessment Details Patient Name: [name] Date of Service: 12/22/2017 8:00 AM Medical Record Number: [id number] Patient Account Number: [id number] Date of Birth/Sex: 04/01/1981 (37 y.o. M) Treating RN: Roger Shelter Primary Care Stanisha Lorenz: [name] Other Clinician: Referring Sacha Topor: [name] Treating Willene Holian/Extender: Melburn Hake, [name]: 8 Clinic Level of Care  Assessment Items TOOL 4 Quantity Score X - Use when only an EandM is performed on FOLLOW-UP visit 1 0 ASSESSMENTS - Nursing Assessment / Reassessment X - Reassessment of Co-morbidities (includes updates in patient status) 1 10 X- 1 5 Reassessment of Adherence to Treatment Plan ASSESSMENTS - Wound and Skin Assessment / Reassessment X - Simple Wound Assessment / Reassessment - one wound 1 5 []  - 0 Complex Wound Assessment / Reassessment - multiple wounds []  - 0 Dermatologic / Skin Assessment (not related to wound area) ASSESSMENTS - Focused Assessment []  - Circumferential Edema Measurements - multi extremities 0 []  - 0 Nutritional Assessment / Counseling / Intervention []  - 0 Lower Extremity Assessment (monofilament, tuning fork, pulses) []  - 0 Peripheral Arterial Disease Assessment (using hand held doppler) ASSESSMENTS - Ostomy and/or Continence Assessment and Care []  - Incontinence Assessment and Management 0 []  - 0 Ostomy Care Assessment and Management (repouching, etc.) PROCESS - Coordination of Care X - Simple Patient / Family Education for ongoing care 1 15 []  - 0 Complex (extensive) Patient / Family Education for ongoing care []  - 0 Staff obtains Programmer, systems, Records, Test Results / Process Orders []  - 0 Staff telephones HHA, Nursing Homes / Clarify orders / etc []  - 0 Routine Transfer to another Facility (non-emergent condition) []  - 0 Routine Hospital Admission (non-emergent condition) []  - 0 New Admissions / Biomedical engineer / Ordering NPWT, Apligraf, etc. []  - 0 Emergency Hospital Admission (emergent condition) X- 1 10 Simple Discharge Coordination Bissonnette, Jester ([id number]) []  - 0 Complex (extensive) Discharge Coordination PROCESS - Special Needs []  - Pediatric / Minor Patient Management 0 []  - 0 Isolation Patient Management []  - 0 Hearing / Language / Visual special needs []  - 0 Assessment of Community assistance (transportation, D/C planning,  etc.) []  - 0 Additional assistance / Altered mentation []  - 0 Support Surface(s) Assessment (bed, cushion, seat, etc.) INTERVENTIONS - Wound Cleansing / Measurement X - Simple Wound Cleansing - one wound 1 5 []  -  0 Complex Wound Cleansing - multiple wounds X- 1 5 Wound Imaging (photographs - any number of wounds) []  - 0 Wound Tracing (instead of photographs) X- 1 5 Simple Wound Measurement - one wound []  - 0 Complex Wound Measurement - multiple wounds INTERVENTIONS - Wound Dressings X - Small Wound Dressing one or multiple wounds 1 10 []  - 0 Medium Wound Dressing one or multiple wounds []  - 0 Large Wound Dressing one or multiple wounds []  - 0 Application of Medications - topical []  - 0 Application of Medications - injection INTERVENTIONS - Miscellaneous []  - External ear exam 0 X- 1 5 Specimen Collection (cultures, biopsies, blood, body fluids, etc.) []  - 0 Specimen(s) / Culture(s) sent or taken to Lab for analysis []  - 0 Patient Transfer (multiple staff / Civil Service fast streamer / Similar devices) []  - 0 Simple Staple / Suture removal (25 or less) []  - 0 Complex Staple / Suture removal (26 or more) []  - 0 Hypo / Hyperglycemic Management (close monitor of Blood Glucose) []  - 0 Ankle / Brachial Index (ABI) - do not check if billed separately X- 1 5 Vital Signs Sanmiguel, August ([id number]) Has the patient been seen at the hospital within the last three years: Yes Total Score: 80 Level Of Care: New/Established - Level 3 Electronic Signature(s) Signed: 12/23/2017 8:47:36 AM By: Roger Shelter Entered By: Roger Shelter on 12/22/2017 08:36:30 [name] ([id number]) -------------------------------------------------------------------------------- Encounter Discharge Information Details Patient Name: [name] Date of Service: 12/22/2017 8:00 AM Medical Record Number: [id number] Patient Account Number: [id number] Date of Birth/Sex: 1981/05/01 (37 y.o. M) Treating RN: Montey Hora Primary Care Sosha Shepherd: [name] Other Clinician: Referring Arslan Kier: [name] Treating Bretta Fees/Extender: Melburn Hake, [name]: 33 Encounter Discharge Information Items Discharge Condition: Stable Ambulatory Status: Ambulatory Discharge Destination: Home Transportation: Private Auto Accompanied By: self Schedule Follow-up Appointment: Yes Clinical Summary of Care: Electronic Signature(s) Signed: 12/22/2017 8:47:49 AM By: Montey Hora Entered By: Montey Hora on 12/22/2017 08:47:48 [name] ([id number]) -------------------------------------------------------------------------------- Lower Extremity Assessment Details Patient Name: [name] Date of Service: 12/22/2017 8:00 AM Medical Record Number: [id number] Patient Account Number: [id number] Date of Birth/Sex: 1981-03-28 (36 y.o. M) Treating RN: Ahmed Prima Primary Care Nocholas Damaso: [name] Other Clinician: Referring Emagene Merfeld: [name] Treating Niko Penson/Extender: Melburn Hake, [name]: 10 Vascular Assessment Pulses: Dorsalis Pedis Palpable: [Right:Yes] Posterior Tibial Extremity colors, hair growth, and conditions: Extremity Color: [Right:Hyperpigmented] Temperature of Extremity: [Right:Warm] Capillary Refill: [Right:< 3 seconds] Toe Nail Assessment Left: Right: Thick: Yes Discolored: No Deformed: No Improper Length and Hygiene: No Electronic Signature(s) Signed: 12/22/2017 4:59:33 PM By: Alric Quan Entered By: Alric Quan on 12/22/2017 08:21:06 Brendle, [name] ([id number]) -------------------------------------------------------------------------------- Multi Wound Chart Details Patient Name: [name] Date of Service: 12/22/2017 8:00 AM Medical Record Number: [id number] Patient Account Number: [id number] Date of Birth/Sex: December 19, 1980 (36 y.o. M) Treating RN: Roger Shelter Primary Care Markise Haymer: [name] Other  Clinician: Referring Veatrice Eckstein: [name] Treating Correne Lalani/Extender: Melburn Hake, [name]: 27 Vital Signs Height(in): 71 Pulse(bpm): 79 Weight(lbs): 338 Blood Pressure(mmHg): 130/76 Body Mass Index(BMI): 47 Temperature(F): 98.2 Respiratory Rate 18 (breaths/min): Photos: [1:No Photos] [N/A:N/A] Wound Location: [1:Left Lower Leg - Lateral] [N/A:N/A] Wounding Event: [1:Gradually Appeared] [N/A:N/A] 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] [N/A:N/A] Comorbid History: [1:Sleep Apnea, Hypertension, Colitis] [N/A:N/A] Date Acquired: [1:11/18/2015] [N/A:N/A] Weeks of Treatment: [1:54] [N/A:N/A] Wound Status: [1:Open] [N/A:N/A] Measurements L x W x D [1:2.1x3x0.5] [N/A:N/A] (cm) Area (cm) : [1:4.948] [N/A:N/A] Volume (cm) : [1:2.474] [N/A:N/A] % Reduction in Area: [1:-0.80%] [N/A:N/A] %  Reduction in Volume: [1:37.00%] [N/A:N/A] Classification: [1:Full Thickness Without Exposed Support Structures] [N/A:N/A] Exudate Amount: [1:Large] [N/A:N/A] Exudate Type: [1:Purulent] [N/A:N/A] Exudate Color: [1:yellow, brown, green] [N/A:N/A] Wound Margin: [1:Distinct, outline attached] [N/A:N/A] Granulation Amount: [1:Medium (34-66%)] [N/A:N/A] Granulation Quality: [1:Red] [N/A:N/A] Necrotic Amount: [1:Medium (34-66%)] [N/A:N/A] Exposed Structures: [1:Fat Layer (Subcutaneous Tissue) Exposed: Yes Fascia: No Tendon: No Muscle: No Joint: No Bone: No] [N/A:N/A] Epithelialization: [1:Medium (34-66%)] [N/A:N/A] Periwound Skin Texture: [1:Induration: Yes Excoriation: No Callus: No Crepitus: No] [N/A:N/A] Rash: No Scarring: No Periwound Skin Moisture: Maceration: Yes N/A N/A Dry/Scaly: No Periwound Skin Color: Erythema: Yes N/A N/A Hemosiderin Staining: Yes Atrophie Blanche: No Cyanosis: No Ecchymosis: No Mottled: No Pallor: No Rubor: No Erythema Location: Circumferential N/A N/A Erythema Change: Increased N/A N/A Temperature: No Abnormality N/A N/A Tenderness on Palpation:  Yes N/A N/A Wound Preparation: Ulcer Cleansing: Wound N/A N/A Cleanser Topical Anesthetic Applied: Other: Lidocaine 4% Treatment Notes Electronic Signature(s) Signed: 12/23/2017 8:47:36 AM By: Roger Shelter Entered By: Roger Shelter on 12/22/2017 08:34:02 KENNETH, CUARESMA ([id number]) -------------------------------------------------------------------------------- Multi-Disciplinary Care Plan Details Patient Name: [name] Date of Service: 12/22/2017 8:00 AM Medical Record Number: [id number] Patient Account Number: [id number] Date of Birth/Sex: 08/20/1980 (36 y.o. M) Treating RN: Roger Shelter Primary Care Trevino Wyatt: [name] Other Clinician: Referring Onetta Spainhower: [name] Treating Roemello Speyer/Extender: Melburn Hake, [name]: 68 Active Inactive ` Orientation to the Wound Care Program Nursing Diagnoses: Knowledge deficit related to the wound healing center program Goals: Patient/caregiver will verbalize understanding of the [facility] Program Date Initiated: 12/11/2016 Target Resolution Date: 03/13/2017 Goal Status: Active Interventions: Provide education on orientation to the wound center Notes: ` Venous Leg Ulcer Nursing Diagnoses: Knowledge deficit related to disease process and management Potential for venous Insuffiency (use before diagnosis confirmed) Goals: Non-invasive venous studies are completed as ordered Date Initiated: 12/11/2016 Target Resolution Date: 03/13/2017 Goal Status: Active Patient will maintain optimal edema control Date Initiated: 12/11/2016 Target Resolution Date: 03/13/2017 Goal Status: Active Patient/caregiver will verbalize understanding of disease process and disease management Date Initiated: 12/11/2016 Target Resolution Date: 03/13/2017 Goal Status: Active Verify adequate tissue perfusion prior to therapeutic compression application Date Initiated: 12/11/2016 Target Resolution Date: 03/13/2017 Goal  Status: Active Interventions: Assess peripheral edema status every visit. Compression as ordered Provide education on venous insufficiency Treatment Activities: OLEY, LAHAIE ([id number]) Therapeutic compression applied : 12/11/2016 Notes: ` Wound/Skin Impairment Nursing Diagnoses: Impaired tissue integrity Knowledge deficit related to ulceration/compromised skin integrity Goals: Patient/caregiver will verbalize understanding of skin care regimen Date Initiated: 12/11/2016 Target Resolution Date: 03/13/2017 Goal Status: Active Ulcer/skin breakdown will have a volume reduction of 30% by week 4 Date Initiated: 12/11/2016 Target Resolution Date: 03/13/2017 Goal Status: Active Ulcer/skin breakdown will have a volume reduction of 50% by week 8 Date Initiated: 12/11/2016 Target Resolution Date: 03/13/2017 Goal Status: Active Ulcer/skin breakdown will have a volume reduction of 80% by week 12 Date Initiated: 12/11/2016 Target Resolution Date: 03/13/2017 Goal Status: Active Ulcer/skin breakdown will heal within 14 weeks Date Initiated: 12/11/2016 Target Resolution Date: 03/13/2017 Goal Status: Active Interventions: Assess patient/caregiver ability to obtain necessary supplies Assess patient/caregiver ability to perform ulcer/skin care regimen upon admission and as needed Assess ulceration(s) every visit Provide education on ulcer and skin care Treatment Activities: Skin care regimen initiated : 12/11/2016 Topical wound management initiated : 12/11/2016 Notes: Electronic Signature(s) Signed: 12/23/2017 8:47:36 AM By: Roger Shelter Entered By: Roger Shelter on 12/22/2017 08:33:45 [name] ([id number]) -------------------------------------------------------------------------------- Pain Assessment Details Patient Name: [name] Date of Service: 12/22/2017 8:00 AM Medical  Record Number: [id number] Patient Account Number: [id number] Date of Birth/Sex: Jul 30, 1980 (36 y.o.  M) Treating RN: Ahmed Prima Primary Care Jolleen Seman: [name] Other Clinician: Referring Belkys Henault: [name] Treating Treg Diemer/Extender: Melburn Hake, [name]: 17 Active Problems Location of Pain Severity and Description of Pain Patient Has Paino Yes Site Locations Pain Location: Pain in Ulcers Rate the pain. Current Pain Level: 4 Character of Pain Describe the Pain: Burning Pain Management and Medication Current Pain Management: Notes Topical or injectable lidocaine is offered to patient for acute pain when surgical debridement is performed. If needed, Patient is instructed to use over the counter pain medication for the following 24-48 hours after debridement. Wound care MDs do not prescribed pain medications. Patient has chronic pain or uncontrolled pain. Patient has been instructed to make an appointment with their Primary Care Physician for pain management. Electronic Signature(s) Signed: 12/22/2017 4:59:33 PM By: Alric Quan Entered By: Alric Quan on 12/22/2017 08:13:20 [name] ([id number]) -------------------------------------------------------------------------------- Patient/Caregiver Education Details Patient Name: [name] Date of Service: 12/22/2017 8:00 AM Medical Record Number: [id number] Patient Account Number: [id number] Date of Birth/Gender: Jan 09, 1981 (36 y.o. M) Treating RN: Montey Hora Primary Care Physician: [name] Other Clinician: Referring Physician: [name] Treating Physician/Extender: Sharalyn Ink in Treatment: 14 Education Assessment Education Provided To: Patient Education Topics Provided Wound/Skin Impairment: Handouts: Other: wound care as ordered Methods: Demonstration, Explain/Verbal Responses: State content correctly Electronic Signature(s) Signed: 12/23/2017 4:05:36 PM By: Montey Hora Entered By: Montey Hora on 12/22/2017 08:48:19 [name]  ([id number]) -------------------------------------------------------------------------------- Wound Assessment Details Patient Name: [name] Date of Service: 12/22/2017 8:00 AM Medical Record Number: [id number] Patient Account Number: [id number] Date of Birth/Sex: [birth year] (36 y.o. M) Treating RN: Ahmed Prima Primary Care Shedrick Sarli: [name] Other Clinician: Referring Athziry Millican: [name] Treating Arnav Cregg/Extender: Melburn Hake, [name]: 77 Wound Status Wound Number: 1 Primary Etiology: Pyoderma Wound Location: Left Lower Leg - Lateral Wound Status: Open Wounding Event: Gradually Appeared Comorbid History: Sleep Apnea, Hypertension, Colitis Date Acquired: 11/18/2015 Weeks Of Treatment: 54 Clustered Wound: No Photos Photo Uploaded By: Alric Quan on 12/23/2017 15:59:23 Wound Measurements Length: (cm) 2.1 Width: (cm) 3 Depth: (cm) 0.5 Area: (cm) 4.948 Volume: (cm) 2.474 % Reduction in Area: -0.8% % Reduction in Volume: 37% Epithelialization: Medium (34-66%) Tunneling: No Undermining: No Wound Description Full Thickness Without Exposed Support Classification: Structures Wound Margin: Distinct, outline attached Exudate Large Amount: Exudate Type: Purulent Exudate Color: yellow, brown, green Foul Odor After Cleansing: No Slough/Fibrino Yes Wound Bed Granulation Amount: Medium (34-66%) Exposed Structure Granulation Quality: Red Fascia Exposed: No Necrotic Amount: Medium (34-66%) Fat Layer (Subcutaneous Tissue) Exposed: Yes Necrotic Quality: Adherent Slough Tendon Exposed: No Muscle Exposed: No Joint Exposed: No Bone Exposed: No Celli, Rishav ([id number]) Periwound Skin Texture Texture Color No Abnormalities Noted: No No Abnormalities Noted: No Callus: No Atrophie Blanche: No Crepitus: No Cyanosis: No Excoriation: No Ecchymosis: No Induration: Yes Erythema: Yes Rash: No Erythema Location:  Circumferential Scarring: No Erythema Change: Increased Hemosiderin Staining: Yes Moisture Mottled: No No Abnormalities Noted: No Pallor: No Dry / Scaly: No Rubor: No Maceration: Yes Temperature / Pain Temperature: No Abnormality Tenderness on Palpation: Yes Wound Preparation Ulcer Cleansing: Wound Cleanser Topical Anesthetic Applied: Other: Lidocaine 4%, Treatment Notes Wound #1 (Left, Lateral Lower Leg) 1. Cleansed with: Clean wound with Normal Saline 2. Anesthetic Topical Lidocaine 4% cream to wound bed prior to debridement 3. Peri-wound Care: Skin Prep 4. Dressing Applied: Santyl Ointment 5. Secondary Dressing Applied Bordered Foam  Dressing Dry Gauze Electronic Signature(s) Signed: 12/22/2017 4:59:33 PM By: Alric Quan Entered By: Alric Quan on 12/22/2017 08:19:52 CARLEE, TESFAYE ([id number]) -------------------------------------------------------------------------------- Ivor Details Patient Name: [name] Date of Service: 12/22/2017 8:00 AM Medical Record Number: [id number] Patient Account Number: [id number] Date of Birth/Sex: March 07, 1981 (36 y.o. M) Treating RN: Ahmed Prima Primary Care Antiono Ettinger: [name] Other Clinician: Referring Josalin Carneiro: [name] Treating Inaya Gillham/Extender: Melburn Hake, [name]: 83 Vital Signs Time Taken: 08:20 Temperature (F): 98.2 Height (in): 71 Pulse (bpm): 79 Weight (lbs): 338 Respiratory Rate (breaths/min): 18 Body Mass Index (BMI): 47.1 Blood Pressure (mmHg): 130/76 Reference Range: 80 - 120 mg / dl Electronic Signature(s) Signed: 12/22/2017 4:59:33 PM By: Alric Quan Entered By: Alric Quan on 12/22/2017 08:20:29

## 2018-01-03 DIAGNOSIS — F3342 Major depressive disorder, recurrent, in full remission: Secondary | ICD-10-CM

## 2018-01-05 ENCOUNTER — Ambulatory Visit: Payer: 59 | Admitting: Physician Assistant

## 2018-01-05 MED ORDER — VENLAFAXINE HCL ER 150 MG PO CP24: ORAL_CAPSULE | ORAL | 0 refills | Status: DC

## 2018-01-06 ENCOUNTER — Encounter: Payer: 59 | Admitting: Physician Assistant

## 2018-01-06 DIAGNOSIS — L97822 Non-pressure chronic ulcer of other part of left lower leg with fat layer exposed: Secondary | ICD-10-CM | POA: Diagnosis not present

## 2018-01-06 DIAGNOSIS — L88 Pyoderma gangrenosum: Secondary | ICD-10-CM | POA: Diagnosis not present

## 2018-01-06 DIAGNOSIS — L97222 Non-pressure chronic ulcer of left calf with fat layer exposed: Secondary | ICD-10-CM | POA: Diagnosis not present

## 2018-01-07 ENCOUNTER — Ambulatory Visit
Admit: 2018-01-07 | Discharge: 2018-01-08 | Payer: PRIVATE HEALTH INSURANCE | Attending: Dermatology | Primary: Dermatology

## 2018-01-07 DIAGNOSIS — L88 Pyoderma gangrenosum: Secondary | ICD-10-CM

## 2018-01-07 MED ORDER — PREDNISONE 20 MG TABLET
ORAL_TABLET | Freq: Every day | ORAL | 1 refills | 0.00000 days | Status: CP
Start: 2018-01-07 — End: 2018-09-24

## 2018-01-13 ENCOUNTER — Other Ambulatory Visit
Admission: RE | Admit: 2018-01-13 | Discharge: 2018-01-13 | Disposition: A | Discharge status: Home / Self Care | Payer: 59 | Source: Ambulatory Visit | Attending: Physician Assistant | Admitting: Physician Assistant

## 2018-01-13 ENCOUNTER — Encounter: Payer: 59 | Admitting: Physician Assistant

## 2018-01-13 DIAGNOSIS — L88 Pyoderma gangrenosum: Secondary | ICD-10-CM | POA: Diagnosis not present

## 2018-01-13 DIAGNOSIS — B999 Unspecified infectious disease: Secondary | ICD-10-CM | POA: Insufficient documentation

## 2018-01-13 DIAGNOSIS — L97825 Non-pressure chronic ulcer of other part of left lower leg with muscle involvement without evidence of necrosis: Secondary | ICD-10-CM | POA: Diagnosis not present

## 2018-01-13 DIAGNOSIS — L97822 Non-pressure chronic ulcer of other part of left lower leg with fat layer exposed: Secondary | ICD-10-CM | POA: Diagnosis not present

## 2018-01-15 LAB — AEROBIC CULTURE W GRAM STAIN (SUPERFICIAL SPECIMEN)

## 2018-01-16 NOTE — Progress Notes (Signed)
[name], [name] ([id number]) Visit Report for 01/06/2018 Chief Complaint Document Details Patient Name: [name], [name] Date of Service: 01/06/2018 12:30 PM Medical Record Number: [id number] Patient Account Number: [id number] Date of Birth/Sex: [birth year] (37 y.o. M) Treating RN: Ahmed Prima Primary Care Provider: [name] Other Clinician: Referring Provider: [name] Treating Provider/Extender: Melburn Hake, Yashvi Jasinski Weeks in Treatment: 39 Information Obtained from: Patient Chief Complaint He is here in follow up evaluation for LLE pyoderma ulcer Electronic Signature(s) Signed: 01/07/2018 12:11:53 PM By: Worthy Keeler PA-C Entered By: Worthy Keeler on 01/06/2018 12:44:21 ANTAEUS, KAREL ([id number]) -------------------------------------------------------------------------------- HPI Details Patient Name: [name] Date of Service: 01/06/2018 12:30 PM Medical Record Number: [id number] Patient Account Number: [id number] Date of Birth/Sex: [birth year] (37 y.o. M) Treating RN: Ahmed Prima Primary Care Provider: [name] Other Clinician: Referring Provider: [name] Treating Provider/Extender: Melburn Hake, Belma Dyches Weeks in Treatment: 26 History of Present Illness HPI Description: 12/04/16; 37 year old man who comes into the clinic today for review of a wound on the posterior left calf. He tells me that is been there for about a year. He is not a diabetic he does smoke half a pack per day. He was seen in the ER on 11/20/16 felt to have cellulitis around the wound and was given clindamycin. An x-ray did not show osteomyelitis. The patient initially tells me that he has a milk allergy that sets off a pruritic itching rash on his lower legs which she scratches incessantly and he thinks that's what may have set up the wound. He has been using various topical antibiotics and ointments without any effect. He works in a [employer] and is on his feet all day. He does not have  a prior history of wounds however he does have the rash on both lower legs the right arm and the ventral aspect of his left arm. These are excoriations and clearly have had scratching however there are of macular looking areas on both legs including a substantial larger area on the right leg. This does not have an underlying open area. There is no blistering. The patient tells me that 2 years ago in [place] in response to the rash on his legs he saw a dermatologist who told him he had a condition which may be pyoderma gangrenosum although I may be putting words into his mouth. He seemed to recognize this. 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 He is not in any treatment for this. He's had no recent travel 12/11/16; the patient arrives today with his wound and roughly the same condition we've been using silver alginate this is a deep punched out wound with some surrounding erythema but no tenderness. Biopsy I did did not show confirmed pyoderma gangrenosum suggested nonspecific inflammation and vasculitis but does not provide an actual description of what was seen by the pathologist. I'm really not able to understand this We have also received information from the patient's dermatologist in [place] notes from April 2016. This was a doctor Agarwal- antal. 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 He was prescribed topical steroid high potency under occlusion which helped but at this point the patient did not have a deep punched out wound. 12/18/16; the patient's wound is larger in terms of surface area however this surface looks better and there is less depth. The surrounding erythema also is better. The patient states that the wrap we put on came off 2 days ago when he has been using  his compression stockings. He we are in the process of getting a dermatology consult. 12/26/16 on evaluation today patient's left lower extremity wound shows evidence  of infection with surrounding erythema noted. He has been tolerating the dressing changes but states that he has noted more discomfort. There is a larger area of erythema surrounding the wound. No fevers, chills, nausea, or vomiting noted at this time. With that being said the wound still does have slough covering the surface. He is not allergic to any medication that he is aware of at this point. In regard to his right lower extremity he had several regions that are erythematous and pruritic he wonders if there's anything we can do to help that. 01/02/17 I reviewed patient's wound culture which was obtained his visit last week. He was placed on doxycycline at that point. Unfortunately that does not appear to be an antibiotic that would likely help with the situation however the pseudomonas noted on culture is sensitive to Cipro. Also unfortunately patient's wound seems to have a large compared to last week's evaluation. Not severely so but there are definitely increased measurements in general. He is continuing to have discomfort as well he writes this to be a seven out of 10. In fact he would prefer me not to perform any debridement today due to the fact that he is having discomfort and considering he has an active infection on the little reluctant to do so anyway. No fevers, chills, nausea, or vomiting noted at this time. 01/08/17; patient seems dermatology on September 5. I suspect dermatology will want the slides from the biopsy I did sent to their pathologist. I'm not sure if there is a way we can expedite that. In any case the culture I did before I left on vacation 3 weeks ago showed Pseudomonas he was given 10 days of Cipro and per her description of her intake nurses is actually somewhat better this week although the wound is quite a bit bigger than I remember the last time I saw this. He still has 3 more days of Cipro [name], [name] ([id number]) 01/21/17; dermatology appointment tomorrow. He  has completed the ciprofloxacin for Pseudomonas. Surface of the wound looks better however he is had some deterioration in the lesions on his right leg. Meantime the left lateral leg wound we will continue with sample 01/29/17; patient had his dermatology appointment but I can't yet see that note. He is completed his antibiotics. The wound is more superficial but considerably larger in circumferential area than when he came in. This is in his left lateral calf. He also has swollen erythematous areas with superficial wounds on the right leg and small papular areas on both arms. There apparently areas in her his upper thighs and buttocks I did not look at those. Dermatology biopsied the right leg. Hopefully will have their input next week. 02/05/17; patient went back to see his dermatologist who told him that he had a "scratching problem" as well as staph. He is now on a 30 day course of doxycycline and I believe she gave him triamcinolone cream to the right leg areas to help with the itching [not exactly sure but probably triamcinolone]. She apparently looked at the left lateral leg wound although this was not rebiopsied and I think felt to be ultimately part of the same pathogenesis. He is using sample border foam and changing nevus himself. He now has a new open area on the right posterior leg which was his biopsy site I don't  have any of the dermatology notes 02/12/17; we put the patient in compression last week with SANTYL to the wound on the left leg and the biopsy. Edema is much better and the depth of the wound is now at level of skin. Area is still the same oBiopsy site on the right lateral leg we've also been using santyl with a border foam dressing and he is changing this himself. 02/19/17; Using silver alginate started last week to both the substantial left leg wound and the biopsy site on the right wound. He is tolerating compression well. Has a an appointment with his primary M.D. tomorrow  wondering about diuretics although I'm wondering if the edema problem is actually lymphedema 02/26/17; the patient has been to see his primary doctor Dr. Jerrel Ivory at [facility] our primary care. She started him on Lasix 20 mg and this seems to have helped with the edema. However we are not making substantial change with the left lateral calf wound and inflammation. The biopsy site on the right leg also looks stable but not really all that different. 03/12/17; the patient has been to see vein and vascular Dr. Lucky Cowboy. He has had venous reflux studies I have not reviewed these. I did get a call from his dermatology office. 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 They therefore recommended clobetasol and tetracycline i.e. 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 03/26/17; apparently the patient just had reflux studies not an appointment with Dr. dew. She arrives in clinic today having applied clobetasol for 2-3 weeks. He notes over the last 2-3 days excessive drainage having to change the dressing 3-4 times a day and also expanding erythema. 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 Lillia Carmel at [facility] dermatology locally who kindly saw him at my request. 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 My concern was the degree of erythema medially extending from his left leg wound which was either inflammation from  pyoderma or cellulitis. I put him on Augmentin however culture of the wound showed Pseudomonas which is quinolone sensitive. I really don't believe he has cellulitis however in view of everything I will continue and give him a course of Cipro. He is also on doxycycline as an immune modulator for the pyoderma. In addition to his original wound on the left lateral leg with surrounding erythema he has a wound on the right posterior calf which was an original biopsy site done by dermatology. 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 Saw Dr. Lillia Carmel yesterday. He has been using topical antibiotics to both wound areas his original wound on the left and the biopsies/pathergy area on the right. There is definitely some improvement in the inflammation around the wound on the right although the patient states he has increasing sensitivity of the wounds. He is on prednisone 60 and doxycycline 1 as prescribed by Dr. Lillia Carmel. He is covering the topical  antibiotic with gauze and putting this in his own compression stocks and changing this daily. He states that Dr. Lottie Rater did a culture of the left leg wound yesterday 05/07/17; pyoderma gangrenosum. The patient saw Dr. Lillia Carmel yesterday and has a follow-up with her in one month. He is still using topical antibiotics to both wounds although he can't recall exactly what type. He is still on prednisone 60 mg. Dr. Lillia Carmel stated that the doxycycline could stop if we were in agreement. 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 The right medial leg was induced by biopsy/pathergy. The area on the right is essentially healed. 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 The right medial leg has closed and  remains closed. He is still on prednisone 60. oHe tells me he missed his last dermatology appointment with Dr. Lillia Carmel but will make another appointment. He reports that her blood sugar at a recent screen in [place] was high 200's. He was 180 today. He is more cushingoid blood pressure is Hum, Kazuma ([id number]) up a bit. I think he is going to require still much longer prednisone perhaps another 3 months before attempting to taper. In the meantime his wound is a lot better. Smaller. He is cleaning this off daily and applying topical antibiotics. When he was last in the clinic I thought about changing to [facility] and actually put in a couple of calls to dermatology although probably not during their business hours. 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 He continues on oral prednisone. He has been using triple antibiotic ointment. There is surface debris and we will transition to [facility] and have him return in 2 weeks. He has lost 30 pounds since his last appointment with lifestyle modification. He may benefit from topical steroid cream for treatment this can be considered at a later date. 08/22/17 on evaluation today patient appears to actually be doing rather well in regard to his left lateral lower extremity ulcer. He has actually been managed by Dr. Dellia Nims most recently. Patient is currently on oral steroids at this time. This seems to have been of benefit for him. Nonetheless his last visit was actually with Leah on 08/06/17. Currently he is not utilizing any topical steroid creams although this could be of benefit as well. No fevers, chills, nausea, or vomiting noted at this time. 09/05/17 on evaluation today patient appears to be doing better in regard to his left lateral lower extremity ulcer. He has been tolerating the dressing changes without complication.  He is using Santyl with good effect. Overall I'm very pleased with how things are standing at this point. Patient likewise is happy that this is doing better. 09/19/17 on evaluation today patient actually appears to be doing rather well in regard to his left lateral lower extremity ulcer. 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 He's not having any significant pain. 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 He has been tolerating the Santyl without complication and in general I feel like he's making good  progress. 10/17/17 on evaluation today patient appears to be doing very well in regard to his left lateral lower surety ulcer. He has been tolerating the dressing changes without complication. There does not appear to be any evidence of infection he's alternating the Santyl and the triple antibiotic ointment every other day this seems to be doing well for him. 11/03/17 on evaluation today patient appears to be doing very well in regard to his left lateral lower extremity ulcer. He is been tolerating the dressing changes without complication which is good news. Fortunately there does not appear to be any evidence of infection which is also great news. Overall is doing excellent they are starting to taper down on the prednisone is down to 40 mg at this point it also started topical clobetasol for him. 11/17/17 on evaluation today patient appears to be doing well in regard to his left lateral lower surety ulcer. He's been tolerating the dressing changes without complication. He does note that he is having no pain, no excessive drainage or discharge, and overall he feels like things are going about how he would expect and hope they would. Overall he seems to have no evidence of infection at this time in my opinion which is  good news. 12/04/17-He is seen in follow-up evaluation for right lateral lower extremity ulcer. He has been applying topical steroid cream. Today's measurement show slight increase in size. Over the next 2 weeks we will transition to every other day Santyl and steroid cream. He has been encouraged to monitor for changes and notify clinic with any concerns 12/15/17 on evaluation today patient's left lateral motion the ulcer and fortunately is doing worse again at this point. This just since last week to this week has close to doubled in size according to the patient. I did not seeing last week's I do not have a visual to compare this to in our system was also down so we do not have all the charts and at this point. Nonetheless it does have me somewhat concerned in regard to the fact that again he was worried enough about it he has contact the dermatology that placed them back on the full strength, 50 mg a day of the prednisone that he was taken previous. He continues to alternate using clobetasol along with Santyl at this point. He is obviously somewhat frustrated. 12/22/17 on evaluation today patient appears to be doing a little worse compared to last evaluation. Unfortunately the wound is a little deeper and slightly larger than the last week's evaluation. With that being said he has made some progress in regard to the irritation surrounding at this time unfortunately despite that progress that's been made he still has a significant issue going on here. 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 For that reason I discussed with him today the possibility of perform a wound culture to ensure there's nothing overtly infected. 01/06/18 on evaluation today patient's wound is larger and deeper than previously evaluated. With that being said it did appear that  his wound was infected after my last evaluation with him. Subsequently I did end up prescribing a prescription for Bactrim DS which she has been taking and having no complication with. Fortunately there does not appear to be any evidence of [place], [place]on ([id number]) infection at this point in time as far as anything spreading, no want to  touch, and overall I feel like things are showing signs of improvement. Electronic Signature(s) Signed: 01/07/2018 12:11:53 PM By: Worthy Keeler PA-C Entered By: Worthy Keeler on 01/06/2018 13:04:31 [name], [name] ([id number]) -------------------------------------------------------------------------------- Physical Exam Details Patient Name: [name] Date of Service: 01/06/2018 12:30 PM Medical Record Number: [id number] Patient Account Number: [id number] Date of Birth/Sex: 07-Nov-1980 (36 y.o. M) Treating RN: Ahmed Prima Primary Care Provider: [name] Other Clinician: Referring Provider: [name] Treating Provider/Extender: Melburn Hake, Alaze Garverick Weeks in Treatment: 16 Constitutional Well-nourished and well-hydrated in no acute distress. Respiratory normal breathing without difficulty. clear to auscultation bilaterally. Cardiovascular regular rate and rhythm with normal S1, S2. Psychiatric this patient is able to make decisions and demonstrates good insight into disease process. Alert and Oriented x 3. pleasant and cooperative. Notes At this time patient's wound bed actually show signs of muscle exposed in the base of the wound. No sharp debridement was performed. With that being said he does have some Slough noted on the surface of the wound especially in the perimeter but really not as much as I've seen in the past in regard to his wounds. He still has a lot of inflammation noted surrounding the wound bed but again no want to touch and no evidence of significant infection at this time. Definitely no purulent discharge. Electronic  Signature(s) Signed: 01/07/2018 12:11:53 PM By: Worthy Keeler PA-C Entered By: Worthy Keeler on 01/06/2018 13:05:27 [name], [name] ([id number]) -------------------------------------------------------------------------------- Physician Orders Details Patient Name: [name] Date of Service: 01/06/2018 12:30 PM Medical Record Number: [id number] Patient Account Number: [id number] Date of Birth/Sex: 1981-02-18 (36 y.o. M) Treating RN: Ahmed Prima Primary Care Provider: [name] Other Clinician: Referring Provider: [name] Treating Provider/Extender: Melburn Hake, Alexes Lamarque Weeks in Treatment: 23 Verbal / Phone Orders: Yes Clinician: Carolyne Fiscal, Debi Read Back and Verified: Yes Diagnosis Coding ICD-10 Coding Code Description [phone] Non-pressure chronic ulcer of left calf with fat layer exposed L88 Pyoderma gangrenosum I87.2 Venous insufficiency (chronic) (peripheral) Wound Cleansing Wound #1 Left,Lateral Lower Leg o Clean wound with Normal Saline. o Cleanse wound with mild soap and water o May Shower, gently pat wound dry prior to applying new dressing. Anesthetic (add to Medication List) Wound #1 Left,Lateral Lower Leg o Topical Lidocaine 4% cream applied to wound bed prior to debridement (In Clinic Only). Skin Barriers/Peri-Wound Care Wound #1 Left,Lateral Lower Leg o Triamcinolone Acetonide Ointment (TCA) - apply to wound o Other: - uses clabetazole at home, alternate this cream and santyl on wound Primary Wound Dressing Wound #1 Left,Lateral Lower Leg o Other: - TCA Secondary Dressing Wound #1 Left,Lateral Lower Leg o Boardered Foam Dressing Dressing Change Frequency Wound #1 Left,Lateral Lower Leg o Change dressing every day. Follow-up Appointments Wound #1 Left,Lateral Lower Leg o Return Appointment in 2 weeks. Edema Control Wound #1 Left,Lateral Lower Leg o Patient to wear own compression stockings [name], [name]  ([id number]) Additional Orders / Instructions Wound #1 Left,Lateral Lower Leg o Stop Smoking o Increase protein intake. Patient Medications Allergies: mik, Biaxin, seasonal Notifications Medication Indication Start End lidocaine DOSE 1 - topical 4 % cream - 1 cream topical Electronic Signature(s) Signed: 01/07/2018 12:11:53 PM By: Worthy Keeler PA-C Signed: 01/07/2018 4:43:18 PM By: Alric Quan Entered By: Alric Quan on 01/06/2018 12:59:07 [name], PAVEY ([id number]) -------------------------------------------------------------------------------- Prescription 01/06/2018 Patient Name: [name] Provider: Worthy Keeler PA-C Date of Birth: 1980/12/22 NPI#: [id number] Sex: M DEA#: [id number] Phone #: [phone] License #: Patient Address: [facility] [place], [place] [address] [address] [place], [place] [address] [id number] Allergies mik Biaxin seasonal Medication Medication: Route: Strength: Form: lidocaine topical 4% cream Class: TOPICAL LOCAL ANESTHETICS Dose: Frequency / Time: Indication: 1 1 cream topical Number of Refills: Number of Units: 0 Generic Substitution: Start Date: End Date: Administered at [facility]: Yes Time Administered: Time Discontinued: Note to Pharmacy: Signature(s): Date(s): Electronic Signature(s) ARLES, RUMBOLD ([id number]) Signed: 01/07/2018 12:11:53 PM By: Worthy Keeler PA-C Signed: 01/07/2018 4:43:18 PM By: Alric Quan Entered By: Alric Quan on 01/06/2018 12:59:07 [name] ([id number]) --------------------------------------------------------------------------------  Problem List Details Patient Name: [name] Date of Service: 01/06/2018 12:30 PM Medical Record Number: [id number] Patient Account Number: [id number] Date of Birth/Sex: 04-28-1981 (37 y.o. 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 M) Treating RN: Ahmed Prima Primary Care Provider: [name] Other Clinician: Referring Provider: [name] Treating Provider/Extender: Melburn Hake, Viraaj Vorndran Weeks in Treatment: 23 Subjective Chief Complaint Information obtained from Patient He is here in follow up evaluation for LLE pyoderma ulcer History of Present Illness (HPI) 12/04/16; 37 year old man who comes into the clinic today for review of a wound on the posterior left calf. He tells me that is been there for about a year. He is not a diabetic he does smoke half a pack per day. He was seen in the ER on 11/20/16 felt to have cellulitis around the wound and was given clindamycin. An x-ray did not show osteomyelitis. The patient initially tells me that he has a milk allergy that sets off a pruritic itching rash on his lower legs which she scratches  incessantly and he thinks that's what may have set up the wound. He has been using various topical antibiotics and ointments without any effect. He works in a [employer] and is on his feet all day. He does not have a prior history of wounds however he does have the rash on both lower legs the right arm and the ventral aspect of his left arm. These are excoriations and clearly have had scratching however there are of macular looking areas on both legs including a substantial larger area on the right leg. This does not have an underlying open area. There is no blistering. The patient tells me that 2 years ago in [place] in response to the rash on his legs he saw a dermatologist who told him he had a condition which may be pyoderma gangrenosum although I may be putting words into his mouth. He seemed to recognize this. 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 He is not in any treatment for this. He's had no recent travel 12/11/16; the patient arrives  today with his wound and roughly the same condition we've been using silver alginate this is a deep punched out wound with some surrounding erythema but no tenderness. Biopsy I did did not show confirmed pyoderma gangrenosum suggested nonspecific inflammation and vasculitis but does not provide an actual description of what was seen by the pathologist. I'm really not able to understand this We have also received information from the patient's dermatologist in [place] notes from April 2016. This was a doctor Agarwal- antal. 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 He was prescribed topical steroid high potency under occlusion which helped but at this point the patient did not have a deep punched out wound. 12/18/16; the patient's wound is larger in terms of surface area however this surface looks better and there is less depth. The surrounding erythema also is better. The patient states that the wrap  we put on came off 2 days ago when he has been using his compression stockings. He we are in the process of getting a dermatology consult. 12/26/16 on evaluation today patient's left lower extremity wound shows evidence of infection with surrounding erythema noted. He has been tolerating the dressing changes but states that he has noted more discomfort. There is a larger area of erythema surrounding the wound. No fevers, chills, nausea, or vomiting noted at this time. With that being said the wound still does have slough covering the surface. He is not allergic to any medication that he is aware of at this point. In regard to his right lower extremity he had several regions that are erythematous and pruritic he wonders if there's anything we can do to help that. 01/02/17 I reviewed patient's wound culture which was obtained his visit last week. He was placed on doxycycline at that point. Unfortunately that does not appear to be an antibiotic that would likely help with the situation however the pseudomonas noted on culture is sensitive to Cipro. Also unfortunately patient's wound seems to have a large compared to last week's evaluation. Not severely so but there are definitely increased measurements in general. He is continuing to have discomfort as well he writes this to be a seven out of 10. In fact he would prefer me not to perform any debridement today due to the fact that he is having discomfort and considering he has an active infection on the little reluctant to do so anyway. No fevers, Calk, Fuquan ([id number]) chills, nausea, or vomiting noted at this time. 01/08/17; patient seems dermatology on September 5. I suspect dermatology will want the slides from the biopsy I did sent to their pathologist. I'm not sure if there is a way we can expedite that. In any case the culture I did before I left on vacation 3 weeks ago showed Pseudomonas he was given 10 days of Cipro and per her description of  her intake nurses is actually somewhat better this week although the wound is quite a bit bigger than I remember the last time I saw this. He still has 3 more days of Cipro 01/21/17; dermatology appointment tomorrow. He has completed the ciprofloxacin for Pseudomonas. Surface of the wound looks better however he is had some deterioration in the lesions on his right leg. Meantime the left lateral leg wound we will continue with sample 01/29/17; patient had his dermatology appointment but I can't yet see that note. He is completed his antibiotics. The wound is more superficial but considerably larger in circumferential area than when he came in. This  is in his left lateral calf. He also has swollen erythematous areas with superficial wounds on the right leg and small papular areas on both arms. There apparently areas in her his upper thighs and buttocks I did not look at those. Dermatology biopsied the right leg. Hopefully will have their input next week. 02/05/17; patient went back to see his dermatologist who told him that he had a "scratching problem" as well as staph. He is now on a 30 day course of doxycycline and I believe she gave him triamcinolone cream to the right leg areas to help with the itching [not exactly sure but probably triamcinolone]. She apparently looked at the left lateral leg wound although this was not rebiopsied and I think felt to be ultimately part of the same pathogenesis. He is using sample border foam and changing nevus himself. He now has a new open area on the right posterior leg which was his biopsy site I don't have any of the dermatology notes 02/12/17; we put the patient in compression last week with SANTYL to the wound on the left leg and the biopsy. Edema is much better and the depth of the wound is now at level of skin. Area is still the same Biopsy site on the right lateral leg we've also been using santyl with a border foam dressing and he is changing this  himself. 02/19/17; Using silver alginate started last week to both the substantial left leg wound and the biopsy site on the right wound. He is tolerating compression well. Has a an appointment with his primary M.D. tomorrow wondering about diuretics although I'm wondering if the edema problem is actually lymphedema 02/26/17; the patient has been to see his primary doctor Dr. Jerrel Ivory at [place] our primary care. She started him on Lasix 20 mg and this seems to have helped with the edema. However we are not making substantial change with the left lateral calf wound and inflammation. The biopsy site on the right leg also looks stable but not really all that different. 03/12/17; the patient has been to see vein and vascular Dr. Lucky Cowboy. He has had venous reflux studies I have not reviewed these. I did get a call from his dermatology office. 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 They therefore recommended clobetasol and tetracycline i.e. 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 03/26/17; apparently the patient just had reflux studies not an appointment with Dr. dew. She arrives in clinic today having applied clobetasol for 2-3 weeks. He notes over the last 2-3 days excessive drainage having to change the dressing 3-4 times a day and also expanding erythema. 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 Lillia Carmel at [facility] dermatology locally who kindly saw him at my request.  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 My concern was the degree of erythema medially extending from his left leg wound which was either inflammation from pyoderma or cellulitis.  I put him on Augmentin however culture of the wound showed Pseudomonas which is quinolone sensitive. I really don't believe he has cellulitis however in view of everything I will continue and give him a course of Cipro. He is also on doxycycline as an immune modulator for the pyoderma. In addition to his original wound on the left lateral leg with surrounding erythema he has a wound on the right posterior calf which was an original biopsy site done by dermatology. 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 Saw Dr. Lillia Carmel yesterday. He has been using topical antibiotics to both wound areas his original wound on the left and the biopsies/pathergy area on the right. There is definitely some improvement in the inflammation around the wound on the right although the patient states he has increasing sensitivity of the wounds. He is on prednisone 60 and doxycycline 1 as prescribed by Dr. Lillia Carmel. He is covering the topical antibiotic with gauze and putting this in his own compression stocks and changing this daily. He states that Dr. Lottie Rater did a culture of the left leg wound yesterday 05/07/17; pyoderma gangrenosum. The patient saw Dr. Lillia Carmel yesterday and has a follow-up with her in one month. He is still using topical antibiotics to both wounds although he can't recall exactly what type. He is still on prednisone 60 mg. Dr. Lillia Carmel stated that the doxycycline could stop if we were in agreement. 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 The right medial leg was induced by [name]  ([id number]) biopsy/pathergy. The area on the right is essentially healed. 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 The right medial leg has closed and remains closed. He is still on prednisone 60. He tells me he missed his last dermatology appointment with Dr. Lillia Carmel but will make another appointment. He reports that her blood sugar at a recent screen in [place] was high 200's. He was 180 today. 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 I think he is going to require still much longer prednisone perhaps another 3 months before attempting to taper. In the meantime his wound is a lot better. Smaller. He is cleaning this off daily and applying topical antibiotics. When he was last in the clinic I thought about changing to [facility] and actually put in a couple of calls to dermatology although probably not during their business hours. 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 He continues on oral prednisone. He has been using triple antibiotic ointment. There is surface debris and we will transition to [facility] and have him return in 2 weeks. He has lost 30 pounds since his last appointment with lifestyle modification. He may benefit from topical steroid cream for treatment this can be considered at a later date. 08/22/17 on evaluation today patient appears to actually be doing rather well in regard to his left lateral lower extremity ulcer. He has actually been managed by Dr. Dellia Nims most recently. Patient is currently on oral steroids at this time. This seems to have been of benefit for him. Nonetheless his last visit was actually with Leah on 08/06/17. Currently he is not utilizing any topical steroid creams  although this could be of benefit as well. No fevers, chills,  nausea, or vomiting noted at this time. 09/05/17 on evaluation today patient appears to be doing better in regard to his left lateral lower extremity ulcer. He has been tolerating the dressing changes without complication. He is using Santyl with good effect. Overall I'm very pleased with how things are standing at this point. Patient likewise is happy that this is doing better. 09/19/17 on evaluation today patient actually appears to be doing rather well in regard to his left lateral lower extremity ulcer. 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 He's not having any significant pain. 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 He has been tolerating the Santyl without complication and in general I feel like he's making good progress. 10/17/17 on evaluation today patient appears to be doing very well in regard to his left lateral lower surety ulcer. He has been tolerating the dressing changes without complication. There does not appear to be any evidence of infection he's alternating the Santyl and the triple antibiotic ointment every other day this seems to be doing well for him. 11/03/17 on evaluation today patient appears to be doing very well in regard to his left lateral lower extremity ulcer. He is been tolerating the dressing changes without complication which is good news. Fortunately there does not appear to be any evidence of infection which is also great news. Overall is doing excellent they are starting to taper down on the prednisone is down to 40 mg at this point it also started topical clobetasol for him. 11/17/17 on evaluation today patient appears to be doing well in regard to his left lateral lower surety ulcer. He's been tolerating the dressing changes without complication. He does note that he is  having no pain, no excessive drainage or discharge, and overall he feels like things are going about how he would expect and hope they would. Overall he seems to have no evidence of infection at this time in my opinion which is good news. 12/04/17-He is seen in follow-up evaluation for right lateral lower extremity ulcer. He has been applying topical steroid cream. Today's measurement show slight increase in size. Over the next 2 weeks we will transition to every other day Santyl and steroid cream. He has been encouraged to monitor for changes and notify clinic with any concerns 12/15/17 on evaluation today patient's left lateral motion the ulcer and fortunately is doing worse again at this point. This just since last week to this week has close to doubled in size according to the patient. I did not seeing last week's I do not have a visual to compare this to in our system was also down so we do not have all the charts and at this point. Nonetheless it does have me somewhat concerned in regard to the fact that again he was worried enough about it he has contact the dermatology that placed them back on the full strength, 50 mg a day of the prednisone that he was taken previous. He continues to alternate using clobetasol along with Santyl at this point. He is obviously somewhat frustrated. 12/22/17 on evaluation today patient appears to be doing a little worse compared to last evaluation. Unfortunately the wound is a little deeper and slightly larger than the last week's evaluation. With  that being said he has made some progress in regard to the irritation surrounding at this time unfortunately despite that progress that's been made he still has a significant issue going on here. I'm not certain that he is having really any true infection at this time although with the Pyoderma gangrenosum it can [name], [name] ([id number]) sometimes be difficult to differentiate infection versus just inflammation. For that  reason I discussed with him today the possibility of perform a wound culture to ensure there's nothing overtly infected. 01/06/18 on evaluation today patient's wound is larger and deeper than previously evaluated. With that being said it did appear that his wound was infected after my last evaluation with him. Subsequently I did end up prescribing a prescription for Bactrim DS which she has been taking and having no complication with. Fortunately there does not appear to be any evidence of infection at this point in time as far as anything spreading, no want to touch, and overall I feel like things are showing signs of improvement. Patient History Information obtained from Patient. Family History Diabetes - Mother,Maternal Grandparents, Heart Disease - Mother,Maternal Grandparents, Hereditary Spherocytosis - Siblings, Hypertension - Maternal Grandparents, Stroke - Siblings, No family history of Cancer, Kidney Disease, Lung Disease, Seizures, Thyroid Problems, Tuberculosis. Social History Current some day smoker, Marital Status - Married, Alcohol Use - Rarely, Drug Use - No History, Caffeine Use - Moderate. Review of Systems (ROS) Constitutional Symptoms (General Health) Denies complaints or symptoms of Fever, Chills. Respiratory The patient has no complaints or symptoms. Cardiovascular The patient has no complaints or symptoms. Psychiatric The patient has no complaints or symptoms. Objective Constitutional Well-nourished and well-hydrated in no acute distress. Vitals Time Taken: 12:34 PM, Height: 71 in, Weight: 338 lbs, BMI: 47.1, Temperature: 98.3 F, Pulse: 107 bpm, Respiratory Rate: 18 breaths/min, Blood Pressure: 157/89 mmHg. Respiratory normal breathing without difficulty. clear to auscultation bilaterally. Cardiovascular regular rate and rhythm with normal S1, S2. Psychiatric this patient is able to make decisions and demonstrates good insight into disease process. Alert and  Oriented x 3. pleasant and cooperative. [name], [name] ([id number]) General Notes: At this time patient's wound bed actually show signs of muscle exposed in the base of the wound. No sharp debridement was performed. With that being said he does have some Slough noted on the surface of the wound especially in the perimeter but really not as much as I've seen in the past in regard to his wounds. He still has a lot of inflammation noted surrounding the wound bed but again no want to touch and no evidence of significant infection at this time. Definitely no purulent discharge. Integumentary (Hair, Skin) Wound #1 status is Open. Original cause of wound was Gradually Appeared. The wound is located on the Left,Lateral Lower Leg. The wound measures 3.2cm length x 3.8cm width x 0.7cm depth; 9.55cm^2 area and 6.685cm^3 volume. There is Fat Layer (Subcutaneous Tissue) Exposed exposed. There is no tunneling or undermining noted. There is a large amount of purulent drainage noted. The wound margin is distinct with the outline attached to the wound base. There is medium (34- 66%) red granulation within the wound bed. There is a medium (34-66%) amount of necrotic tissue within the wound bed including Adherent Slough. The periwound skin appearance exhibited: Induration, Maceration, Hemosiderin Staining, Erythema. The periwound skin appearance did not exhibit: Callus, Crepitus, Excoriation, Rash, Scarring, Dry/Scaly, Atrophie Blanche, Cyanosis, Ecchymosis, Mottled, Pallor, Rubor. The surrounding wound skin color is noted with erythema which is circumferential. Periwound temperature  was noted as No Abnormality. The periwound has tenderness on palpation. 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 Cleanse  wound with mild soap and water May Shower, gently pat wound dry prior to applying new dressing. Anesthetic (add to Medication List): Wound #1 Left,Lateral Lower Leg: Topical Lidocaine 4% cream applied to wound bed prior to debridement (In Clinic Only). Skin Barriers/Peri-Wound Care: Wound #1 Left,Lateral Lower Leg: Triamcinolone Acetonide Ointment (TCA) - apply to wound Other: - uses clabetazole at home, alternate this cream and santyl on wound Primary Wound Dressing: Wound #1 Left,Lateral Lower Leg: Other: - TCA Secondary Dressing: Wound #1 Left,Lateral Lower Leg: Boardered Foam Dressing Dressing Change Frequency: Wound #1 Left,Lateral Lower Leg: Change dressing every day. Follow-up Appointments: WEAVER, [name] ([id number]) Wound #1 Left,Lateral Lower Leg: Return Appointment in 2 weeks. Edema Control: Wound #1 Left,Lateral Lower Leg: Patient to wear own compression stockings Additional Orders / Instructions: Wound #1 Left,Lateral Lower Leg: Stop Smoking Increase protein intake. The following medication(s) was prescribed: lidocaine topical 4 % cream 1 1 cream topical was prescribed at facility At this point is gonna be going to see his dermatologist tomorrow. In the interim I did go ahead and get him scheduled for a fault visit next week as well for [place] to see were things stand. He's in agreement that plan. Cephus Slater subsequently see him back for reevaluation in one weeks time to see were things are at that point. He states that he may want to re-swab the wound we will see what shows currently at that point. Nonetheless if anything changes he'll let me know. My hope is that this will continue to show signs of improvement going forward following today. The first step is going to be to show that the wound is not gaining any ground as far as enlarging in size or depth. Once we establish that point we may be able to initiate some of the treatment measures to help this to heal and  feeling more rapidly. Please see above for specific wound care orders. We will see patient for re-evaluation in 1 week(s) here in the clinic. If anything worsens or changes patient will contact our office for additional recommendations. Electronic Signature(s) Signed: 01/07/2018 12:11:53 PM By: Worthy Keeler PA-C Entered By: Worthy Keeler on 01/06/2018 13:06:39 [name], [name] ([id number]) -------------------------------------------------------------------------------- ROS/PFSH Details Patient Name: [name] Date of Service: 01/06/2018 12:30 PM Medical Record Number: [id number] Patient Account Number: [id number] Date of Birth/Sex: 08-Jan-1981 (36 y.o. M) Treating RN: Ahmed Prima Primary Care Provider: [name] Other Clinician: Referring Provider: [name] Treating Provider/Extender: Melburn Hake, Kynsie Falkner Weeks in Treatment: 63 Information Obtained From Patient Wound History Do you currently have one or more open woundso Yes How many open wounds do you currently haveo 1 Approximately how long have you had your woundso 1 year How have you been treating your wound(s) until nowo neosporin Has your wound(s) ever healed and then re-openedo No Have you had any lab work done in the past montho No Have you tested positive for an antibiotic resistant organism (MRSA, VRE)o No Have you tested positive for osteomyelitis (bone infection)o No Have you had any tests for circulation on your legso No Have you had other problems associated with your woundso Infection, Swelling Constitutional Symptoms (General Health) Complaints and Symptoms: Negative for: Fever; Chills Eyes Medical History: Negative for: Cataracts;  Glaucoma; Optic Neuritis Ear/Nose/Mouth/Throat Medical History: Negative for: Chronic sinus problems/congestion; Middle ear problems Hematologic/Lymphatic Medical History: Negative for: Anemia; Hemophilia; Human Immunodeficiency Virus; Lymphedema; Sickle Cell  Disease Respiratory Complaints and Symptoms: No Complaints or Symptoms Medical History: Positive for: Sleep Apnea - cpap Negative for: Aspiration; Asthma; Chronic Obstructive Pulmonary Disease (COPD); Pneumothorax Cardiovascular Complaints and Symptoms: No Complaints or Symptoms Medical History: Positive for: Hypertension Matas, Lovelle ([id number]) Negative for: Angina; Arrhythmia; Congestive Heart Failure; Coronary Artery Disease; Deep Vein Thrombosis; Hypotension; Myocardial Infarction; Peripheral Arterial Disease; Peripheral Venous Disease; Phlebitis; Vasculitis Gastrointestinal Medical History: Positive for: Colitis Endocrine Medical History: Negative for: Type I Diabetes; Type II Diabetes Genitourinary Medical History: Negative for: End Stage Renal Disease Immunological Medical History: Negative for: Lupus Erythematosus; Raynaudos; Scleroderma Integumentary (Skin) Medical History: Negative for: History of Burn; History of pressure wounds Musculoskeletal Medical History: Negative for: Gout; Rheumatoid Arthritis; Osteoarthritis; Osteomyelitis Neurologic Medical History: Negative for: Dementia; Neuropathy; Quadriplegia; Paraplegia; Seizure Disorder Oncologic Medical History: Negative for: Received Chemotherapy; Received Radiation Psychiatric Complaints and Symptoms: No Complaints or Symptoms Medical History: Negative for: Anorexia/bulimia; Confinement Anxiety Immunizations Pneumococcal Vaccine: Received Pneumococcal Vaccination: No Implantable Devices Family and Social History KACETON, VIEAU ([id number]) Cancer: No; Diabetes: Yes - Mother,Maternal Grandparents; Heart Disease: Yes - Mother,Maternal Grandparents; Hereditary Spherocytosis: Yes - Siblings; Hypertension: Yes - Maternal Grandparents; Kidney Disease: No; Lung Disease: No; Seizures: No; Stroke: Yes - Siblings; Thyroid Problems: No; Tuberculosis: No; Current some day smoker; Marital Status - Married;  Alcohol Use: Rarely; Drug Use: No History; Caffeine Use: Moderate; Financial Concerns: No; Food, Clothing or Shelter Needs: No; Support System Lacking: No; Transportation Concerns: No; Advanced Directives: No; Patient does not want information on Advanced Directives; Living Will: No Physician Affirmation I have reviewed and agree with the above information. Electronic Signature(s) Signed: 01/07/2018 12:11:53 PM By: Worthy Keeler PA-C Signed: 01/07/2018 4:43:18 PM By: Alric Quan Entered By: Worthy Keeler on 01/06/2018 13:04:50 Korus, [name] ([id number]) -------------------------------------------------------------------------------- SuperBill Details Patient Name: [name] Date of Service: 01/06/2018 Medical Record Number: [id number] Patient Account Number: [id number] Date of Birth/Sex: July 30, 1980 (36 y.o. 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 I87.2 Venous insufficiency (chronic) (peripheral) Facility Procedures CPT4 Code: 97530051 Description: 10211 - WOUND CARE VISIT-LEV 3 EST PT Modifier: Quantity: 1 Physician Procedures CPT4 Code: 1735670 Description: 14103 - WC PHYS LEVEL 3 - EST PT ICD-10 Diagnosis Description L97.222 Non-pressure chronic ulcer of left calf with fat layer expo L88 Pyoderma gangrenosum I87.2 Venous insufficiency (chronic) (peripheral) Modifier: sed Quantity: 1 Electronic Signature(s) Signed: 01/06/2018 5:07:11 PM By: Alric Quan Signed: 01/07/2018 12:11:53 PM By: Worthy Keeler PA-C Entered By: Alric Quan on 01/06/2018 17:07:11

## 2018-01-16 NOTE — Progress Notes (Signed)
[name], [name] ([id number]) Visit Report for 01/06/2018 Arrival Information Details Patient Name: [name], [name] Date of Service: 01/06/2018 12:30 PM Medical Record Number: [id number] Patient Account Number: [id number] Date of Birth/Sex: 18-May-1981 (37 y.o. M) Treating RN: Montey Hora Primary Care Shirah Roseman: [name] Other Clinician: Referring Autie Vasudevan: [name] Treating Catlynn Grondahl/Extender: Melburn Hake, [name]: 50 Visit Information History Since Last Visit Added or deleted any medications: No Patient Arrived: Ambulatory Any new allergies or adverse reactions: No Arrival Time: 12:33 Had a fall or experienced change in No Accompanied By: self activities of daily living that may affect Transfer Assistance: None risk of falls: Patient Identification Verified: Yes Signs or symptoms of abuse/neglect since last visito No Secondary Verification Process Completed: Yes Hospitalized since last visit: No Patient Requires Transmission-Based No Implantable device outside of the clinic excluding No Precautions: cellular tissue based products placed in the center Patient Has Alerts: No since last visit: Has Dressing in Place as Prescribed: Yes Has Compression in Place as Prescribed: Yes Pain Present Now: Yes Electronic Signature(s) Signed: 01/06/2018 5:07:08 PM By: Montey Hora Entered By: Montey Hora on 01/06/2018 12:34:15 [name] ([id number]) -------------------------------------------------------------------------------- Clinic Level of Care Assessment Details Patient Name: [name] Date of Service: 01/06/2018 12:30 PM Medical Record Number: [id number] Patient Account Number: [id number] Date of Birth/Sex: 31-Jul-1980 (37 y.o. M) Treating RN: Ahmed Prima Primary Care Nedra Mcinnis: [name] Other Clinician: Referring Toy Samarin: [name] Treating Angely Dietz/Extender: Melburn Hake, [name]: 37 Clinic Level of Care Assessment  Items TOOL 4 Quantity Score X - Use when only an EandM is performed on FOLLOW-UP visit 1 0 ASSESSMENTS - Nursing Assessment / Reassessment X - Reassessment of Co-morbidities (includes updates in patient status) 1 10 X- 1 5 Reassessment of Adherence to Treatment Plan ASSESSMENTS - Wound and Skin Assessment / Reassessment X - Simple Wound Assessment / Reassessment - one wound 1 5 []  - 0 Complex Wound Assessment / Reassessment - multiple wounds []  - 0 Dermatologic / Skin Assessment (not related to wound area) ASSESSMENTS - Focused Assessment []  - Circumferential Edema Measurements - multi extremities 0 []  - 0 Nutritional Assessment / Counseling / Intervention []  - 0 Lower Extremity Assessment (monofilament, tuning fork, pulses) []  - 0 Peripheral Arterial Disease Assessment (using hand held doppler) ASSESSMENTS - Ostomy and/or Continence Assessment and Care []  - Incontinence Assessment and Management 0 []  - 0 Ostomy Care Assessment and Management (repouching, etc.) PROCESS - Coordination of Care X - Simple Patient / Family Education for ongoing care 1 15 []  - 0 Complex (extensive) Patient / Family Education for ongoing care []  - 0 Staff obtains Programmer, systems, Records, Test Results / Process Orders []  - 0 Staff telephones HHA, Nursing Homes / Clarify orders / etc []  - 0 Routine Transfer to another Facility (non-emergent condition) []  - 0 Routine Hospital Admission (non-emergent condition) []  - 0 New Admissions / Biomedical engineer / Ordering NPWT, Apligraf, etc. []  - 0 Emergency Hospital Admission (emergent condition) X- 1 10 Simple Discharge Coordination [name], [name] ([id number]) []  - 0 Complex (extensive) Discharge Coordination PROCESS - Special Needs []  - Pediatric / Minor Patient Management 0 []  - 0 Isolation Patient Management []  - 0 Hearing / Language / Visual special needs []  - 0 Assessment of Community assistance (transportation, D/C planning, etc.) []  -  0 Additional assistance / Altered mentation []  - 0 Support Surface(s) Assessment (bed, cushion, seat, etc.) INTERVENTIONS - Wound Cleansing / Measurement X - Simple Wound Cleansing - one wound 1 5 []  -  0 Complex Wound Cleansing - multiple wounds X- 1 5 Wound Imaging (photographs - any number of wounds) []  - 0 Wound Tracing (instead of photographs) X- 1 5 Simple Wound Measurement - one wound []  - 0 Complex Wound Measurement - multiple wounds INTERVENTIONS - Wound Dressings X - Small Wound Dressing one or multiple wounds 1 10 []  - 0 Medium Wound Dressing one or multiple wounds []  - 0 Large Wound Dressing one or multiple wounds X- 1 5 Application of Medications - topical []  - 0 Application of Medications - injection INTERVENTIONS - Miscellaneous []  - External ear exam 0 []  - 0 Specimen Collection (cultures, biopsies, blood, body fluids, etc.) []  - 0 Specimen(s) / Culture(s) sent or taken to Lab for analysis []  - 0 Patient Transfer (multiple staff / Civil Service fast streamer / Similar devices) []  - 0 Simple Staple / Suture removal (25 or less) []  - 0 Complex Staple / Suture removal (26 or more) []  - 0 Hypo / Hyperglycemic Management (close monitor of Blood Glucose) []  - 0 Ankle / Brachial Index (ABI) - do not check if billed separately X- 1 5 Vital Signs [name], [name] ([id number]) Has the patient been seen at the hospital within the last three years: Yes Total Score: 80 Level Of Care: New/Established - Level 3 Electronic Signature(s) Signed: 01/07/2018 4:43:18 PM By: Alric Quan Entered By: Alric Quan on 01/06/2018 17:07:03 [name] ([id number]) -------------------------------------------------------------------------------- Encounter Discharge Information Details Patient Name: [name] Date of Service: 01/06/2018 12:30 PM Medical Record Number: [id number] Patient Account Number: [id number] Date of Birth/Sex: [birth year] (36 y.o. M) Treating RN: Roger Shelter Primary Care Hobart Marte: [name] Other Clinician: Referring Teletha Petrea: [name] Treating Alysiana Ethridge/Extender: Melburn Hake, [name]: 69 Encounter Discharge Information Items Discharge Condition: Stable Ambulatory Status: Ambulatory Discharge Destination: Home Transportation: Private Auto Schedule Follow-up Appointment: Yes Clinical Summary of Care: Electronic Signature(s) Signed: 01/06/2018 4:53:55 PM By: Roger Shelter Entered By: Roger Shelter on 01/06/2018 13:04:35 [name] ([id number]) -------------------------------------------------------------------------------- Lower Extremity Assessment Details Patient Name: [name] Date of Service: 01/06/2018 12:30 PM Medical Record Number: [id number] Patient Account Number: [id number] Date of Birth/Sex: May 20, 1981 (37 y.o. M) Treating RN: Montey Hora Primary Care Nasira Janusz: [name] Other Clinician: Referring Keyana Guevara: [name] Treating Thaer Miyoshi/Extender: Melburn Hake, [name]: 37 Vascular Assessment Pulses: Dorsalis Pedis Palpable: [Left:Yes] Posterior Tibial Extremity colors, hair growth, and conditions: Extremity Color: [Left:Normal] Hair Growth on Extremity: [Left:Yes] Temperature of Extremity: [Left:Warm] Capillary Refill: [Left:< 3 seconds] Electronic Signature(s) Signed: 01/06/2018 5:07:08 PM By: Montey Hora Entered By: Montey Hora on 01/06/2018 12:41:28 [name] ([id number]) -------------------------------------------------------------------------------- Multi Wound Chart Details Patient Name: [name] Date of Service: 01/06/2018 12:30 PM Medical Record Number: [id number] Patient Account Number: [id number] Date of Birth/Sex: 1981/02/05 (36 y.o. M) Treating RN: Ahmed Prima Primary Care Harce Volden: [name] Other Clinician: Referring Kweli Grassel: [name] Treating Ianna Salmela/Extender: Melburn Hake, [name]:  75 Vital Signs Height(in): 71 Pulse(bpm): 107 Weight(lbs): 338 Blood Pressure(mmHg): 157/89 Body Mass Index(BMI): 47 Temperature(F): 98.3 Respiratory Rate 18 (breaths/min): Photos: [1:No Photos] [N/A:N/A] Wound Location: [1:Left Lower Leg - Lateral] [N/A:N/A] Wounding Event: [1:Gradually Appeared] [N/A:N/A] 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] [N/A:N/A] Comorbid History: [1:Sleep Apnea, Hypertension, Colitis] [N/A:N/A] Date Acquired: [1:11/18/2015] [N/A:N/A] Weeks of Treatment: [1:56] [N/A:N/A] Wound Status: [1:Open] [N/A:N/A] Measurements L x W x D [1:3.2x3.8x0.7] [N/A:N/A] (cm) Area (cm) : [1:9.55] [N/A:N/A] Volume (cm) : [1:6.685] [N/A:N/A] % Reduction in Area: [1:-94.50%] [N/A:N/A] % Reduction in Volume: [1:-70.20%] [N/A:N/A] Classification: [1:Full Thickness Without Exposed Support Structures] [N/A:N/A]  Exudate Amount: [1:Large] [N/A:N/A] Exudate Type: [1:Purulent] [N/A:N/A] Exudate Color: [1:yellow, brown, green] [N/A:N/A] Wound Margin: [1:Distinct, outline attached] [N/A:N/A] Granulation Amount: [1:Medium (34-66%)] [N/A:N/A] Granulation Quality: [1:Red] [N/A:N/A] Necrotic Amount: [1:Medium (34-66%)] [N/A:N/A] Exposed Structures: [1:Fat Layer (Subcutaneous Tissue) Exposed: Yes Fascia: No Tendon: No Muscle: No Joint: No Bone: No] [N/A:N/A] Epithelialization: [1:Medium (34-66%)] [N/A:N/A] Periwound Skin Texture: [1:Induration: Yes Excoriation: No Callus: No Crepitus: No] [N/A:N/A] Rash: No Scarring: No Periwound Skin Moisture: Maceration: Yes N/A N/A Dry/Scaly: No Periwound Skin Color: Erythema: Yes N/A N/A Hemosiderin Staining: Yes Atrophie Blanche: No Cyanosis: No Ecchymosis: No Mottled: No Pallor: No Rubor: No Erythema Location: Circumferential N/A N/A Erythema Change: Increased N/A N/A Temperature: No Abnormality N/A N/A Tenderness on Palpation: Yes N/A N/A Wound Preparation: Ulcer Cleansing: Wound N/A N/A Cleanser Topical Anesthetic Applied: Other:  Lidocaine 4% Treatment Notes Electronic Signature(s) Signed: 01/07/2018 4:43:18 PM By: Alric Quan Entered By: Alric Quan on 01/06/2018 12:54:37 ZIQUAN, FIDEL ([id number]) -------------------------------------------------------------------------------- Multi-Disciplinary Care Plan Details Patient Name: [name] Date of Service: 01/06/2018 12:30 PM Medical Record Number: [id number] Patient Account Number: [id number] Date of Birth/Sex: August 27, 1980 (36 y.o. M) Treating RN: Ahmed Prima Primary Care Boris Engelmann: [name] Other Clinician: Referring Matilde Pottenger: [name] Treating Conchetta Lamia/Extender: Melburn Hake, [name]: 36 Active Inactive ` Orientation to the Wound Care Program Nursing Diagnoses: Knowledge deficit related to the wound healing center program Goals: Patient/caregiver will verbalize understanding of the [facility] Program Date Initiated: 12/11/2016 Target Resolution Date: 03/13/2017 Goal Status: Active Interventions: Provide education on orientation to the wound center Notes: ` Venous Leg Ulcer Nursing Diagnoses: Knowledge deficit related to disease process and management Potential for venous Insuffiency (use before diagnosis confirmed) Goals: Non-invasive venous studies are completed as ordered Date Initiated: 12/11/2016 Target Resolution Date: 03/13/2017 Goal Status: Active Patient will maintain optimal edema control Date Initiated: 12/11/2016 Target Resolution Date: 03/13/2017 Goal Status: Active Patient/caregiver will verbalize understanding of disease process and disease management Date Initiated: 12/11/2016 Target Resolution Date: 03/13/2017 Goal Status: Active Verify adequate tissue perfusion prior to therapeutic compression application Date Initiated: 12/11/2016 Target Resolution Date: 03/13/2017 Goal Status: Active Interventions: Assess peripheral edema status every visit. Compression as ordered Provide  education on venous insufficiency Treatment Activities: TYSHAWN, CIULLO ([id number]) Therapeutic compression applied : 12/11/2016 Notes: ` Wound/Skin Impairment Nursing Diagnoses: Impaired tissue integrity Knowledge deficit related to ulceration/compromised skin integrity Goals: Patient/caregiver will verbalize understanding of skin care regimen Date Initiated: 12/11/2016 Target Resolution Date: 03/13/2017 Goal Status: Active Ulcer/skin breakdown will have a volume reduction of 30% by week 4 Date Initiated: 12/11/2016 Target Resolution Date: 03/13/2017 Goal Status: Active Ulcer/skin breakdown will have a volume reduction of 50% by week 8 Date Initiated: 12/11/2016 Target Resolution Date: 03/13/2017 Goal Status: Active Ulcer/skin breakdown will have a volume reduction of 80% by week 12 Date Initiated: 12/11/2016 Target Resolution Date: 03/13/2017 Goal Status: Active Ulcer/skin breakdown will heal within 14 weeks Date Initiated: 12/11/2016 Target Resolution Date: 03/13/2017 Goal Status: Active Interventions: Assess patient/caregiver ability to obtain necessary supplies Assess patient/caregiver ability to perform ulcer/skin care regimen upon admission and as needed Assess ulceration(s) every visit Provide education on ulcer and skin care Treatment Activities: Skin care regimen initiated : 12/11/2016 Topical wound management initiated : 12/11/2016 Notes: Electronic Signature(s) Signed: 01/07/2018 4:43:18 PM By: Alric Quan Entered By: Alric Quan on 01/06/2018 12:54:22 [name] ([id number]) -------------------------------------------------------------------------------- Pain Assessment Details Patient Name: [name] Date of Service: 01/06/2018 12:30 PM Medical Record Number: [id number] Patient Account Number: [id number] Date of Birth/Sex: 1980/09/16 (36 y.o.  M) Treating RN: Montey Hora Primary Care Adamarys Shall: [name] Other Clinician: Referring Irlene Crudup:  [name] Treating Javan Gonzaga/Extender: Melburn Hake, [name]: 1 Active Problems Location of Pain Severity and Description of Pain Patient Has Paino Yes Site Locations Pain Location: Pain in Ulcers With Dressing Change: Yes Duration of the Pain. Constant / Intermittento Constant Pain Management and Medication Current Pain Management: Electronic Signature(s) Signed: 01/06/2018 5:07:08 PM By: Montey Hora Entered By: Montey Hora on 01/06/2018 12:34:39 [name] ([id number]) -------------------------------------------------------------------------------- Patient/Caregiver Education Details Patient Name: [name] Date of Service: 01/06/2018 12:30 PM Medical Record Number: [id number] Patient Account Number: [id number] Date of Birth/Gender: 1980/08/04 (36 y.o. M) Treating RN: Roger Shelter Primary Care Physician: [name] Other Clinician: Referring Physician: [name] Treating Physician/Extender: Sharalyn Ink in Treatment: 40 Education Assessment Education Provided To: Patient Education Topics Provided Wound/Skin Impairment: Methods: Explain/Verbal Responses: State content correctly Electronic Signature(s) Signed: 01/06/2018 4:53:55 PM By: Roger Shelter Entered By: Roger Shelter on 01/06/2018 13:04:47 [name] ([id number]) -------------------------------------------------------------------------------- Wound Assessment Details Patient Name: [name] Date of Service: 01/06/2018 12:30 PM Medical Record Number: [id number] Patient Account Number: [id number] Date of Birth/Sex: Dec 13, 1980 (36 y.o. M) Treating RN: Montey Hora Primary Care Tatum Corl: [name] Other Clinician: Referring Sherrian Nunnelley: [name] Treating Marlie Kuennen/Extender: Melburn Hake, [name]: 63 Wound Status Wound Number: 1 Primary Etiology: Pyoderma Wound Location: Left Lower Leg - Lateral Wound Status: Open Wounding  Event: Gradually Appeared Comorbid History: Sleep Apnea, Hypertension, Colitis Date Acquired: 11/18/2015 Weeks Of Treatment: 56 Clustered Wound: No Photos Photo Uploaded By: Montey Hora on 01/06/2018 15:22:55 Wound Measurements Length: (cm) 3.2 Width: (cm) 3.8 Depth: (cm) 0.7 Area: (cm) 9.55 Volume: (cm) 6.685 % Reduction in Area: -94.5% % Reduction in Volume: -70.2% Epithelialization: Medium (34-66%) Tunneling: No Undermining: No Wound Description Full Thickness Without Exposed Support Classification: Structures Wound Margin: Distinct, outline attached Exudate Large Amount: Exudate Type: Purulent Exudate Color: yellow, brown, green Foul Odor After Cleansing: No Slough/Fibrino Yes Wound Bed Granulation Amount: Medium (34-66%) Exposed Structure Granulation Quality: Red Fascia Exposed: No Necrotic Amount: Medium (34-66%) Fat Layer (Subcutaneous Tissue) Exposed: Yes Necrotic Quality: Adherent Slough Tendon Exposed: No Muscle Exposed: No Joint Exposed: No Bone Exposed: No Hiers, Dominque ([id number]) Periwound Skin Texture Texture Color No Abnormalities Noted: No No Abnormalities Noted: No Callus: No Atrophie Blanche: No Crepitus: No Cyanosis: No Excoriation: No Ecchymosis: No Induration: Yes Erythema: Yes Rash: No Erythema Location: Circumferential Scarring: No Erythema Change: Increased Hemosiderin Staining: Yes Moisture Mottled: No No Abnormalities Noted: No Pallor: No Dry / Scaly: No Rubor: No Maceration: Yes Temperature / Pain Temperature: No Abnormality Tenderness on Palpation: Yes Wound Preparation Ulcer Cleansing: Wound Cleanser Topical Anesthetic Applied: Other: Lidocaine 4%, Treatment Notes Wound #1 (Left, Lateral Lower Leg) 1. Cleansed with: Clean wound with Normal Saline 2. Anesthetic Topical Lidocaine 4% cream to wound bed prior to debridement 3. Peri-wound Care: Other peri-wound care (specify in notes) Notes TCA Cream with  boarder foam dressing Electronic Signature(s) Signed: 01/06/2018 5:07:08 PM By: Montey Hora Entered By: Montey Hora on 01/06/2018 12:41:09 [name] ([id number]) -------------------------------------------------------------------------------- Vitals Details Patient Name: [name] Date of Service: 01/06/2018 12:30 PM Medical Record Number: [id number] Patient Account Number: [id number] Date of Birth/Sex: 04-24-1981 (36 y.o. M) Treating RN: Montey Hora Primary Care Geraldyn Shain: [name] Other Clinician: Referring Cynthis Purington: [name] Treating Jerusalem Brownstein/Extender: Melburn Hake, [name]: 83 Vital Signs Time Taken: 12:34 Temperature (F): 98.3 Height (in): 71 Pulse (bpm): 107 Weight (lbs): 338 Respiratory  Rate (breaths/min): 18 Body Mass Index (BMI): 47.1 Blood Pressure (mmHg): 157/89 Reference Range: 80 - 120 mg / dl Electronic Signature(s) Signed: 01/06/2018 5:07:08 PM By: Montey Hora Entered By: Montey Hora on 01/06/2018 12:36:18

## 2018-01-21 ENCOUNTER — Ambulatory Visit
Admit: 2018-01-21 | Discharge: 2018-01-22 | Payer: PRIVATE HEALTH INSURANCE | Attending: Dermatology | Primary: Dermatology

## 2018-01-21 DIAGNOSIS — L88 Pyoderma gangrenosum: Secondary | ICD-10-CM

## 2018-01-21 DIAGNOSIS — A4902 Methicillin resistant Staphylococcus aureus infection, unspecified site: Principal | ICD-10-CM

## 2018-01-21 MED ORDER — ADALIMUMAB 40 MG/0.8 ML SUBCUTANEOUS PEN KIT
6 refills | 0 days | Status: CP
Start: 2018-01-21 — End: 2018-01-29

## 2018-01-21 MED ORDER — MUPIROCIN 2 % TOPICAL OINTMENT
Freq: Three times a day (TID) | TOPICAL | 1 refills | 0.00000 days | Status: CP
Start: 2018-01-21 — End: ?

## 2018-01-23 ENCOUNTER — Ambulatory Visit: Payer: 59 | Admitting: Physician Assistant

## 2018-01-27 DIAGNOSIS — L88 Pyoderma gangrenosum: Secondary | ICD-10-CM | POA: Diagnosis not present

## 2018-01-29 LAB — QUANTIFERON TB GOLD PLUS
QUANTIFERON MITOGEN VALUE: >10 [IU]/mL
QUANTIFERON NIL VALUE: 0.03 [IU]/mL
QUANTIFERON TB GOLD: NEGATIVE
QUANTIFERON TB2 AG VALUE: 0.03 [IU]/mL

## 2018-01-29 LAB — QUANTIFERON INCUBATION

## 2018-01-29 MED ORDER — ADALIMUMAB 40 MG/0.8 ML SUBCUTANEOUS PEN KIT
SUBCUTANEOUS | 6 refills | 0.00000 days | Status: CP
Start: 2018-01-29 — End: 2019-01-06

## 2018-01-29 NOTE — Unmapped (Signed)
Per test claim for Humira at the [facility], patient needs Medication Assistance Program for Prior Authorization.

## 2018-01-30 ENCOUNTER — Encounter: Payer: 59 | Attending: Physician Assistant | Admitting: Physician Assistant

## 2018-01-30 ENCOUNTER — Other Ambulatory Visit: Payer: Self-pay | Admitting: Physician Assistant

## 2018-01-30 DIAGNOSIS — L97222 Non-pressure chronic ulcer of left calf with fat layer exposed: Secondary | ICD-10-CM | POA: Diagnosis not present

## 2018-01-30 DIAGNOSIS — L97229 Non-pressure chronic ulcer of left calf with unspecified severity: Secondary | ICD-10-CM | POA: Diagnosis not present

## 2018-01-30 DIAGNOSIS — B9562 Methicillin resistant Staphylococcus aureus infection as the cause of diseases classified elsewhere: Secondary | ICD-10-CM | POA: Diagnosis not present

## 2018-01-30 DIAGNOSIS — S81802A Unspecified open wound, left lower leg, initial encounter: Secondary | ICD-10-CM

## 2018-01-30 DIAGNOSIS — L88 Pyoderma gangrenosum: Secondary | ICD-10-CM | POA: Diagnosis not present

## 2018-02-04 ENCOUNTER — Ambulatory Visit: Payer: 59

## 2018-02-04 ENCOUNTER — Ambulatory Visit
Admit: 2018-02-04 | Discharge: 2018-02-05 | Payer: PRIVATE HEALTH INSURANCE | Attending: Dermatology | Primary: Dermatology

## 2018-02-04 DIAGNOSIS — Z79899 Other long term (current) drug therapy: Secondary | ICD-10-CM

## 2018-02-04 DIAGNOSIS — L88 Pyoderma gangrenosum: Principal | ICD-10-CM

## 2018-02-04 MED ORDER — PREDNISONE 10 MG TABLET
ORAL_TABLET | Freq: Every day | ORAL | 1 refills | 0 days | Status: CP
Start: 2018-02-04 — End: 2018-09-24

## 2018-02-04 NOTE — Unmapped (Addendum)
Patient Education        prednisone  Pronunciation:  PRED ni sone  Brand:  Rayos  What is the most important information I should know about prednisone?  You should not use prednisone if you have a fungal infection anywhere in your body.  You should not stop using prednisone suddenly. Follow your doctor's instructions about tapering your dose.  What is prednisone?  Prednisone is a steroid that reduces inflammation in the body, and also suppresses your immune system.  Prednisone is used to treat many different conditions such as hormonal disorders, skin diseases, arthritis, lupus, psoriasis, allergic conditions, ulcerative colitis, Crohn's disease, eye diseases, lung diseases, asthma, tuberculosis, blood cell disorders, kidney disorders, leukemia, lymphoma, multiple sclerosis, organ transplant rejection, swelling from a brain tumor or injury.  Prednisone may also be used for purposes not listed in this medication guide.  What should I discuss with my healthcare provider before taking prednisone?  You should not use prednisone if you are allergic to it, or if you have a fungal infection anywhere in your body.  Steroid medication can weaken your immune system, making it easier for you to get an infection or worsening an infection you already have. Tell your doctor about any illness or infection you've had within the past several weeks.  Tell your doctor if you have ever had:  ?? heart problems, high blood pressure, or a heart attack;  ?? glaucoma or cataracts;  ?? herpes infection of the eyes;  ?? past or present tuberculosis;  ?? a parasite infection that causes diarrhea (such as threadworms);  ?? any illness that causes diarrhea;  ?? underactive thyroid;  ?? diabetes;  ?? a stomach ulcer, diverticulitis;  ?? a colostomy or ileostomy;  ?? osteoporosis or low bone mineral density (steroid medication can increase your risk of bone loss);  ?? low levels of calcium or potassium in your blood;  ?? cirrhosis or other liver disease;  ?? mental illness or psychosis; or  ?? a muscle disorder such as myasthenia gravis.  Long-term use of steroids may lead to bone loss (osteoporosis), especially if you smoke or drink alcohol, if you do not exercise, or if you do not get enough vitamin D or calcium in your diet.  It is not known whether this medicine will harm an unborn baby. Tell your doctor if you are pregnant or plan to become pregnant.  You should not breastfeed while using prednisone.  How should I take prednisone?  Follow all directions on your prescription label and read all medication guides or instruction sheets. Your doctor may occasionally change your dose. Use the medicine exactly as directed.  Prednisone is taken daily or every other day, depending on the condition being treated. You may need to take the medicine at a certain time of day. Follow your doctor's instructions about when and how often to take this medicine.  Take with food if prednisone upsets your stomach.  Measure liquid medicine carefully. Use the dosing syringe provided, or use a medicine dose-measuring device (not a kitchen spoon).  Swallow the delayed-release tablet whole and do not crush, chew, or break it.  Prednisone can weaken (suppress) your immune system, and you may get an infection more easily. Call your doctor if you have signs of infection (fever, weakness, cold or flu symptoms, skin sores, diarrhea, frequent or recurring illness).  If you have major surgery or a severe injury or infection, your prednisone dose needs may change. Make sure any doctor caring for you  knows you are using this medicine.  If you use this medicine long-term, you may need medical tests and vision exams.  In case of emergency, wear or carry medical identification to let others know you use a steroid.  You should not stop using prednisone suddenly. Follow your doctor's instructions about tapering your dose.  Store at room temperature away from moisture, heat, and light.  What happens if I miss a dose?  Take the medicine as soon as you can, but skip the missed dose if it is almost time for your next dose. Do not take two doses at one time.  What happens if I overdose?  Seek emergency medical attention or call the Poison Help line at [phone].  High doses or long-term use of prednisone can lead to thinning skin, easy bruising, changes in body fat (especially in your face, neck, back, and waist), increased acne or facial hair, menstrual problems, impotence, or loss of interest in sex.  What should I avoid while taking prednisone?  Do not receive a live vaccine while using prednisone. The vaccine may not work as well and may not fully protect you from disease. Live vaccines include measles, mumps, rubella (MMR), polio, rotavirus, typhoid, yellow fever, varicella (chickenpox), zoster (shingles), and nasal flu (influenza) vaccine.  Avoid being near people who are sick or have infections. Call your doctor for preventive treatment if you are exposed to chickenpox or measles. These conditions can be serious or even fatal in people who are using steroid medicine.  Avoid drinking alcohol.  What are the possible side effects of prednisone?  Get emergency medical help if you have signs of an allergic reaction: hives; difficult breathing; swelling of your face, lips, tongue, or throat.  Call your doctor at once if you have:  ?? muscle pain or weakness;  ?? blurred vision, tunnel vision, eye pain, or seeing halos around lights;  ?? severe depression, changes in personality, unusual thoughts or behavior;  ?? bloody or tarry stools, coughing up blood or vomit that looks like coffee grounds;  ?? swelling, rapid weight gain, feeling short of breath;  ?? irregular heartbeats;  ?? severe headache, pounding in your neck or ears;  ?? decreased adrenal gland hormones --muscle weakness, tiredness, diarrhea, nausea, menstrual changes, skin discoloration, craving salty foods, and feeling light-headed; or  ?? low potassium level --leg cramps, constipation, irregular heartbeats, fluttering in your chest, increased thirst or urination, numbness or tingling, muscle weakness or limp feeling.  Prednisone can affect growth in children. Tell your doctor if your child is not growing at a normal rate while using this medicine.  Common side effects may include:  ?? weight gain (especially in your face or your upper back and torso);  ?? increased appetite;  ?? mood changes, trouble sleeping;  ?? changes in your menstrual periods;  ?? problems with memory or thought;  ?? muscle or joint pain;  ?? weakness;  ?? headache, dizziness, spinning sensation;  ?? nausea, bloating, loss of appetite;  ?? slow wound healing; or  ?? acne, increased sweating, thinning skin, bruising, pinpoint spots under your skin.  This is not a complete list of side effects and others may occur. Call your doctor for medical advice about side effects. You may report side effects to FDA at 1-800-FDA-1088.  What other drugs will affect prednisone?  Sometimes it is not safe to use certain medications at the same time. Some drugs can affect your blood levels of other drugs you take, which may increase  side effects or make the medications less effective.  Tell your doctor about all your current medicines. Many drugs can affect prednisone, especially:  ?? bupropion;  ?? cyclosporine;  ?? digoxin;  ?? ketoconazole;  ?? an antibiotic;  ?? birth control pills or hormone replacement therapy;  ?? a diuretic or water pill;  ?? insulin or oral diabetes medicine;  ?? a blood thinner --warfarin, Coumadin, Jantoven; or  ?? NSAIDs (nonsteroidal anti-inflammatory drugs) --aspirin, ibuprofen (Advil, Motrin), naproxen (Aleve), celecoxib, diclofenac, indomethacin, meloxicam, and others.  This list is not complete and many other drugs may affect prednisone. This includes prescription and over-the-counter medicines, vitamins, and herbal products. Not all possible drug interactions are listed here.  Where can I get more information?  Your pharmacist can provide more information about prednisone.  Remember, keep this and all other medicines out of the reach of children, never share your medicines with others, and use this medication only for the indication prescribed.  Every effort has been made to ensure that the information provided by [employer] is accurate, up-to-date, and complete, but no guarantee is made to that effect. Drug information contained herein may be time sensitive. [employer] information has been compiled for use by healthcare practitioners and consumers in the [place] and therefore [employer] does not warrant that uses outside of the [place] are appropriate, unless specifically indicated otherwise. [employer]'s drug information does not endorse drugs, diagnose patients or recommend therapy. [employer]'s drug information is an [employer] to assist licensed healthcare practitioners in caring for their patients and/or to serve consumers viewing this service as a supplement to, and not a substitute for, the expertise, skill, knowledge and judgment of healthcare practitioners. The absence of a warning for a given drug or drug combination in no way should be construed to indicate that the drug or drug combination is safe, effective or appropriate for any given patient. [employer] does not assume any responsibility for any aspect of healthcare administered with the aid of information [employer] provides. The information contained herein is not intended to cover all possible uses, directions, precautions, warnings, drug interactions, allergic reactions, or adverse effects. If you have questions about the drugs you are taking, check with your doctor, nurse or pharmacist.  Copyright [phone] [employer] Version: 10.01. Revision date: 08/14/2017.  Care instructions adapted under license by [facility]. If you have questions about a medical condition or this instruction, always ask your healthcare professional. [employer] disclaims any warranty or liability for your use of this information.

## 2018-02-04 NOTE — Progress Notes (Signed)
SHRAVAN, SALAHUDDIN ([id number]) Visit Report for 01/30/2018 Chief Complaint Document Details Patient Name: [name], [name] Date of Service: 01/30/2018 8:00 AM Medical Record Number: [id number] Patient Account Number: [id number] Date of Birth/Sex: 07/31/1980 (37 y.o. M) Treating RN: Montey Hora Primary Care Provider: [name] Other Clinician: Referring Provider: [name] Treating Provider/Extender: Melburn Hake, Dessie Tatem Weeks in Treatment: 14 Information Obtained from: Patient Chief Complaint He is here in follow up evaluation for LLE pyoderma ulcer Electronic Signature(s) Signed: 01/30/2018 8:54:03 AM By: Worthy Keeler PA-C Entered By: Worthy Keeler on 01/30/2018 08:01:13 [name] ([id number]) -------------------------------------------------------------------------------- HPI Details Patient Name: [name] Date of Service: 01/30/2018 8:00 AM Medical Record Number: [id number] Patient Account Number: [id number] Date of Birth/Sex: 12-02-1980 (37 y.o. M) Treating RN: Montey Hora Primary Care Provider: [name] Other Clinician: Referring Provider: [name] Treating Provider/Extender: Melburn Hake, Abem Shaddix Weeks in Treatment: 22 History of Present Illness HPI Description: 12/04/16; 37 year old man who comes into the clinic today for review of a wound on the posterior left calf. He tells me that is been there for about a year. He is not a diabetic he does smoke half a pack per day. He was seen in the ER on 11/20/16 felt to have cellulitis around the wound and was given clindamycin. An x-ray did not show osteomyelitis. The patient initially tells me that he has a milk allergy that sets off a pruritic itching rash on his lower legs which she scratches incessantly and he thinks that's what may have set up the wound. He has been using various topical antibiotics and ointments without any effect. He works in a [employer] and is on his feet all day. He does not have a  prior history of wounds however he does have the rash on both lower legs the right arm and the ventral aspect of his left arm. These are excoriations and clearly have had scratching however there are of macular looking areas on both legs including a substantial larger area on the right leg. This does not have an underlying open area. There is no blistering. The patient tells me that 2 years ago in [place] in response to the rash on his legs he saw a dermatologist who told him he had a condition which may be pyoderma gangrenosum although I may be putting words into his mouth. He seemed to recognize this. 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 He is not in any treatment for this. He's had no recent travel 12/11/16; the patient arrives today with his wound and roughly the same condition we've been using silver alginate this is a deep punched out wound with some surrounding erythema but no tenderness. Biopsy I did did not show confirmed pyoderma gangrenosum suggested nonspecific inflammation and vasculitis but does not provide an actual description of what was seen by the pathologist. I'm really not able to understand this We have also received information from the patient's dermatologist in [place] notes from April 2016. This was a doctor Agarwal- antal. 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 He was prescribed topical steroid high potency under occlusion which helped but at this point the patient did not have a deep punched out wound. 12/18/16; the patient's wound is larger in terms of surface area however this surface looks better and there is less depth. The surrounding erythema also is better. The patient states that the wrap we put on came off 2 days ago when he has been using  his compression stockings. He we are in the process of getting a dermatology consult. 12/26/16 on evaluation today patient's left lower extremity wound shows evidence  of infection with surrounding erythema noted. He has been tolerating the dressing changes but states that he has noted more discomfort. There is a larger area of erythema surrounding the wound. No fevers, chills, nausea, or vomiting noted at this time. With that being said the wound still does have slough covering the surface. He is not allergic to any medication that he is aware of at this point. In regard to his right lower extremity he had several regions that are erythematous and pruritic he wonders if there's anything we can do to help that. 01/02/17 I reviewed patient's wound culture which was obtained his visit last week. He was placed on doxycycline at that point. Unfortunately that does not appear to be an antibiotic that would likely help with the situation however the pseudomonas noted on culture is sensitive to Cipro. Also unfortunately patient's wound seems to have a large compared to last week's evaluation. Not severely so but there are definitely increased measurements in general. He is continuing to have discomfort as well he writes this to be a seven out of 10. In fact he would prefer me not to perform any debridement today due to the fact that he is having discomfort and considering he has an active infection on the little reluctant to do so anyway. No fevers, chills, nausea, or vomiting noted at this time. 01/08/17; patient seems dermatology on September 5. I suspect dermatology will want the slides from the biopsy I did sent to their pathologist. I'm not sure if there is a way we can expedite that. In any case the culture I did before I left on vacation 3 weeks ago showed Pseudomonas he was given 10 days of Cipro and per her description of her intake nurses is actually somewhat better this week although the wound is quite a bit bigger than I remember the last time I saw this. He still has 3 more days of Cipro DEVEION, DENZ ([id number]) 01/21/17; dermatology appointment tomorrow. He  has completed the ciprofloxacin for Pseudomonas. Surface of the wound looks better however he is had some deterioration in the lesions on his right leg. Meantime the left lateral leg wound we will continue with sample 01/29/17; patient had his dermatology appointment but I can't yet see that note. He is completed his antibiotics. The wound is more superficial but considerably larger in circumferential area than when he came in. This is in his left lateral calf. He also has swollen erythematous areas with superficial wounds on the right leg and small papular areas on both arms. There apparently areas in her his upper thighs and buttocks I did not look at those. Dermatology biopsied the right leg. Hopefully will have their input next week. 02/05/17; patient went back to see his dermatologist who told him that he had a "scratching problem" as well as staph. He is now on a 30 day course of doxycycline and I believe she gave him triamcinolone cream to the right leg areas to help with the itching [not exactly sure but probably triamcinolone]. She apparently looked at the left lateral leg wound although this was not rebiopsied and I think felt to be ultimately part of the same pathogenesis. He is using sample border foam and changing nevus himself. He now has a new open area on the right posterior leg which was his biopsy site I don't  have any of the dermatology notes 02/12/17; we put the patient in compression last week with SANTYL to the wound on the left leg and the biopsy. Edema is much better and the depth of the wound is now at level of skin. Area is still the same oBiopsy site on the right lateral leg we've also been using santyl with a border foam dressing and he is changing this himself. 02/19/17; Using silver alginate started last week to both the substantial left leg wound and the biopsy site on the right wound. He is tolerating compression well. Has a an appointment with his primary M.D. tomorrow  wondering about diuretics although I'm wondering if the edema problem is actually lymphedema 02/26/17; the patient has been to see his primary doctor Dr. Jerrel Ivory at [facility] our primary care. She started him on Lasix 20 mg and this seems to have helped with the edema. However we are not making substantial change with the left lateral calf wound and inflammation. The biopsy site on the right leg also looks stable but not really all that different. 03/12/17; the patient has been to see vein and vascular Dr. Lucky Cowboy. He has had venous reflux studies I have not reviewed these. I did get a call from his dermatology office. 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 They therefore recommended clobetasol and tetracycline i.e. 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 03/26/17; apparently the patient just had reflux studies not an appointment with Dr. dew. She arrives in clinic today having applied clobetasol for 2-3 weeks. He notes over the last 2-3 days excessive drainage having to change the dressing 3-4 times a day and also expanding erythema. 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 Lillia Carmel at [facility] dermatology locally who kindly saw him at my request. 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 My concern was the degree of erythema medially extending from his left leg wound which was either inflammation from  pyoderma or cellulitis. I put him on Augmentin however culture of the wound showed Pseudomonas which is quinolone sensitive. I really don't believe he has cellulitis however in view of everything I will continue and give him a course of Cipro. He is also on doxycycline as an immune modulator for the pyoderma. In addition to his original wound on the left lateral leg with surrounding erythema he has a wound on the right posterior calf which was an original biopsy site done by dermatology. 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 Saw Dr. Lillia Carmel yesterday. He has been using topical antibiotics to both wound areas his original wound on the left and the biopsies/pathergy area on the right. There is definitely some improvement in the inflammation around the wound on the right although the patient states he has increasing sensitivity of the wounds. He is on prednisone 60 and doxycycline 1 as prescribed by Dr. Lillia Carmel. He is covering the topical  antibiotic with gauze and putting this in his own compression stocks and changing this daily. He states that Dr. Lottie Rater did a culture of the left leg wound yesterday 05/07/17; pyoderma gangrenosum. The patient saw Dr. Lillia Carmel yesterday and has a follow-up with her in one month. He is still using topical antibiotics to both wounds although he can't recall exactly what type. He is still on prednisone 60 mg. Dr. Lillia Carmel stated that the doxycycline could stop if we were in agreement. 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 The right medial leg was induced by biopsy/pathergy. The area on the right is essentially healed. 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 The right medial leg has closed and  remains closed. He is still on prednisone 60. oHe tells me he missed his last dermatology appointment with Dr. Lillia Carmel but will make another appointment. He reports that her blood sugar at a recent screen in [place] was high 200's. He was 180 today. He is more cushingoid blood pressure is [name], [name] ([id number]) up a bit. I think he is going to require still much longer prednisone perhaps another 3 months before attempting to taper. In the meantime his wound is a lot better. Smaller. He is cleaning this off daily and applying topical antibiotics. When he was last in the clinic I thought about changing to [facility] and actually put in a couple of calls to dermatology although probably not during their business hours. 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 He continues on oral prednisone. He has been using triple antibiotic ointment. There is surface debris and we will transition to [facility] and have him return in 2 weeks. He has lost 30 pounds since his last appointment with lifestyle modification. He may benefit from topical steroid cream for treatment this can be considered at a later date. 08/22/17 on evaluation today patient appears to actually be doing rather well in regard to his left lateral lower extremity ulcer. He has actually been managed by Dr. Dellia Nims most recently. Patient is currently on oral steroids at this time. This seems to have been of benefit for him. Nonetheless his last visit was actually with Leah on 08/06/17. Currently he is not utilizing any topical steroid creams although this could be of benefit as well. No fevers, chills, nausea, or vomiting noted at this time. 09/05/17 on evaluation today patient appears to be doing better in regard to his left lateral lower extremity ulcer. He has been tolerating the dressing changes without complication.  He is using Santyl with good effect. Overall I'm very pleased with how things are standing at this point. Patient likewise is happy that this is doing better. 09/19/17 on evaluation today patient actually appears to be doing rather well in regard to his left lateral lower extremity ulcer. 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 He's not having any significant pain. 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 He has been tolerating the Santyl without complication and in general I feel like he's making good  progress. 10/17/17 on evaluation today patient appears to be doing very well in regard to his left lateral lower surety ulcer. He has been tolerating the dressing changes without complication. There does not appear to be any evidence of infection he's alternating the Santyl and the triple antibiotic ointment every other day this seems to be doing well for him. 11/03/17 on evaluation today patient appears to be doing very well in regard to his left lateral lower extremity ulcer. He is been tolerating the dressing changes without complication which is good news. Fortunately there does not appear to be any evidence of infection which is also great news. Overall is doing excellent they are starting to taper down on the prednisone is down to 40 mg at this point it also started topical clobetasol for him. 11/17/17 on evaluation today patient appears to be doing well in regard to his left lateral lower surety ulcer. He's been tolerating the dressing changes without complication. He does note that he is having no pain, no excessive drainage or discharge, and overall he feels like things are going about how he would expect and hope they would. Overall he seems to have no evidence of infection at this time in my opinion which is  good news. 12/04/17-He is seen in follow-up evaluation for right lateral lower extremity ulcer. He has been applying topical steroid cream. Today's measurement show slight increase in size. Over the next 2 weeks we will transition to every other day Santyl and steroid cream. He has been encouraged to monitor for changes and notify clinic with any concerns 12/15/17 on evaluation today patient's left lateral motion the ulcer and fortunately is doing worse again at this point. This just since last week to this week has close to doubled in size according to the patient. I did not seeing last week's I do not have a visual to compare this to in our system was also down so we do not have all the charts and at this point. Nonetheless it does have me somewhat concerned in regard to the fact that again he was worried enough about it he has contact the dermatology that placed them back on the full strength, 50 mg a day of the prednisone that he was taken previous. He continues to alternate using clobetasol along with Santyl at this point. He is obviously somewhat frustrated. 12/22/17 on evaluation today patient appears to be doing a little worse compared to last evaluation. Unfortunately the wound is a little deeper and slightly larger than the last week's evaluation. With that being said he has made some progress in regard to the irritation surrounding at this time unfortunately despite that progress that's been made he still has a significant issue going on here. 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 For that reason I discussed with him today the possibility of perform a wound culture to ensure there's nothing overtly infected. 01/06/18 on evaluation today patient's wound is larger and deeper than previously evaluated. With that being said it did appear that  his wound was infected after my last evaluation with him. Subsequently I did end up prescribing a prescription for Bactrim DS which she has been taking and having no complication with. Fortunately there does not appear to be any evidence of Mccay, Aaryav ([id number]) infection at this point in time as far as anything spreading, no want to  touch, and overall I feel like things are showing signs of improvement. 01/13/18 on evaluation today patient appears to be even a little larger and deeper than last time. There still muscle exposed in the base of the wound. Nonetheless he does appear to be less erythematous I do believe inflammation is calming down also believe the infection looks like it's probably resolved at this time based on what I'm seeing. No fevers, chills, nausea, or vomiting noted at this time. 01/30/18 on evaluation today patient actually appears to visually look better for the most part. Unfortunately those visually this looks better he does seem to potentially have what may be an abscess in the muscle that has been noted in the central portion of the wound. This is the first time that I have noted what appears to be fluctuance in the central portion of the muscle. With that being said I'm somewhat more concerned about the fact that this might indicate an abscess formation at this location. I do believe that an ultrasound would be appropriate. This is likely something we need to try to do as soon as possible. He has been switch to mupirocin ointment and he is no longer using the steroid ointment as prescribed by dermatology he sees them again next week he's been decreased from 60 to 40 mg of prednisone. Electronic Signature(s) Signed: 01/30/2018 8:54:03 AM By: Worthy Keeler PA-C Entered By: Worthy Keeler on 01/30/2018 08:46:34 [name], [name] ([id number]) -------------------------------------------------------------------------------- Physical Exam Details Patient Name: [name] Date of Service: 01/30/2018 8:00 AM Medical Record Number: [id number] Patient Account Number: [id number] Date of Birth/Sex: February 14, 1981 (37 y.o. M) Treating RN: Montey Hora Primary Care Provider: [name] Other Clinician: Referring Provider: [name] Treating Provider/Extender: Melburn Hake, Keng Jewel Weeks in Treatment: 108 Constitutional Well-nourished and well-hydrated in no acute distress. Respiratory normal breathing without difficulty. clear to auscultation bilaterally. Cardiovascular regular rate and rhythm with normal S1, S2. Psychiatric this patient is able to make decisions and demonstrates good insight into disease process. Alert and Oriented x 3. pleasant and cooperative. Notes Patient's wound bed at this point again shows some signs of improvement in regard to the erythema in particular. With that being said I am concerned about the fact it appears he may have an abscess in the deep portion of the muscle which is potentially a little bit more significant than something I would want to just take care of here in the office obviously. With that being said I do believe an ultrasound to identify how deep and the extent of this may be would be appropriate. I definitely do not want to cut into the muscle obviously if there's no need to but again I'm fairly confident he may have developed an abscess in this location. Electronic Signature(s) Signed: 01/30/2018 8:54:03 AM By: Worthy Keeler PA-C Entered By: Worthy Keeler on 01/30/2018 08:47:22 [name], [name] ([id number]) -------------------------------------------------------------------------------- Physician Orders Details Patient Name: [name] Date of Service: 01/30/2018 8:00 AM Medical Record Number: [id number] Patient Account Number: [id number] Date of Birth/Sex: Jun 18, 1980 (37 y.o. M) Treating RN: Montey Hora Primary Care Provider: [name] Other Clinician: Referring Provider: [name] Treating Provider/Extender: Melburn Hake, Mariachristina Holle Weeks in Treatment: 36 Verbal / Phone Orders: No Diagnosis Coding ICD-10 Coding Code Description [phone] Non-pressure chronic ulcer of left calf with fat layer exposed L88 Pyoderma gangrenosum I87.2 Venous insufficiency (chronic) (peripheral) Wound Cleansing Wound #1 Left,Lateral Lower Leg o Clean wound with Normal Saline. o Cleanse wound with mild soap and water   o May Shower, gently pat wound dry prior to applying new dressing. Anesthetic (add to Medication List) Wound #1 Left,Lateral Lower Leg o Topical Lidocaine 4% cream applied to wound bed prior to debridement (In Clinic Only). Skin Barriers/Peri-Wound Care Wound #1 Left,Lateral Lower Leg o Triamcinolone Acetonide Ointment (TCA) - apply to wound o Other: - uses clabetazole at home, alternate this cream and santyl on wound Primary Wound Dressing Wound #1 Left,Lateral Lower Leg o Dry Gauze o Mupirocin Ointment Secondary Dressing Wound #1 Left,Lateral Lower Leg o Boardered Foam Dressing Dressing Change Frequency Wound #1 Left,Lateral Lower Leg o Change dressing every day. Follow-up Appointments Wound #1 Left,Lateral Lower Leg o Return Appointment in 1 week. Edema Control Wound #1 Left,Lateral Lower Leg o Patient to wear own compression stockings [name], [name] ([id number]) Additional Orders / Instructions Wound #1 Left,Lateral Lower Leg o Stop Smoking o Increase protein intake. [employer] o Ultrasound Left Lower Leg Patient Medications Allergies: milk, Biaxin, seasonal Notifications Medication Indication Start End Bactrim DS 01/30/2018 DOSE 1 - oral 800 mg-160 mg tablet - 1 tablet oral taken 2 times a day for 7 days Electronic Signature(s) Signed: 01/30/2018 8:49:27 AM By: Worthy Keeler PA-C Entered By: Worthy Keeler on 01/30/2018 08:49:27 [name]  ([id number]) -------------------------------------------------------------------------------- Problem List Details Patient Name: [name] Date of Service: 01/30/2018 8:00 AM Medical Record Number: [id number] Patient Account Number: [id number] Date of Birth/Sex: 06/30/1980 (37 y.o. M) Treating RN: Montey Hora Primary Care Provider: [name] Other Clinician: Referring Provider: [name] Treating Provider/Extender: Melburn Hake, Kalisa Girtman Weeks in Treatment: 43 Active Problems ICD-10 Evaluated Encounter Code Description Active Date Today Diagnosis L97.222 Non-pressure chronic ulcer of left calf with fat layer exposed 12/04/2016 No Yes L88 Pyoderma gangrenosum 03/26/2017 No Yes I87.2 Venous insufficiency (chronic) (peripheral) 12/04/2016 No Yes Inactive Problems ICD-10 Code Description Active Date Inactive Date L97.213 Non-pressure chronic ulcer of right calf with necrosis of muscle 04/02/2017 04/02/2017 Resolved Problems Electronic Signature(s) Signed: 01/30/2018 8:54:03 AM By: Worthy Keeler PA-C Entered By: Worthy Keeler on 01/30/2018 08:01:05 [name] ([id number]) -------------------------------------------------------------------------------- Progress Note Details Patient Name: [name] Date of Service: 01/30/2018 8:00 AM Medical Record Number: [id number] Patient Account Number: [id number] Date of Birth/Sex: 08/31/1980 (37 y.o. M) Treating RN: Montey Hora Primary Care Provider: [name] Other Clinician: Referring Provider: [name] Treating Provider/Extender: Melburn Hake, Kahli Fitzgerald Weeks in Treatment: 57 Subjective Chief Complaint Information obtained from Patient He is here in follow up evaluation for LLE pyoderma ulcer History of Present Illness (HPI) 12/04/16; 37 year old man who comes into the clinic today for review of a wound on the posterior left calf. He tells me that is been there for about a year. He is not a diabetic he does smoke  half a pack per day. He was seen in the ER on 11/20/16 felt to have cellulitis around the wound and was given clindamycin. An x-ray did not show osteomyelitis. The patient initially tells me that he has a milk allergy that sets off a pruritic itching rash on his lower legs which she scratches incessantly and he thinks that's what may have set up the wound. He has been using various topical antibiotics and ointments without any effect. He works in a [employer] and is on his feet all day. He does not have a prior history of wounds however he does have the rash on both lower legs the right arm and the ventral aspect of his left arm. These are excoriations and clearly have had scratching however  there are of macular looking areas on both legs including a substantial larger area on the right leg. This does not have an underlying open area. There is no blistering. The patient tells me that 2 years ago in [place] in response to the rash on his legs he saw a dermatologist who told him he had a condition which may be pyoderma gangrenosum although I may be putting words into his mouth. He seemed to recognize this. 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 He is not in any treatment for this. He's had no recent travel 12/11/16; the patient arrives today with his wound and roughly the same condition we've been using silver alginate this is a deep punched out wound with some surrounding erythema but no tenderness. Biopsy I did did not show confirmed pyoderma gangrenosum suggested nonspecific inflammation and vasculitis but does not provide an actual description of what was seen by the pathologist. I'm really not able to understand this We have also received information from the patient's dermatologist in [place] notes from April 2016. This was a doctor Agarwal- antal. 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 He was prescribed topical steroid high potency  under occlusion which helped but at this point the patient did not have a deep punched out wound. 12/18/16; the patient's wound is larger in terms of surface area however this surface looks better and there is less depth. The surrounding erythema also is better. The patient states that the wrap we put on came off 2 days ago when he has been using his compression stockings. He we are in the process of getting a dermatology consult. 12/26/16 on evaluation today patient's left lower extremity wound shows evidence of infection with surrounding erythema noted. He has been tolerating the dressing changes but states that he has noted more discomfort. There is a larger area of erythema surrounding the wound. No fevers, chills, nausea, or vomiting noted at this time. With that being said the wound still does have slough covering the surface. He is not allergic to any medication that he is aware of at this point. In regard to his right lower extremity he had several regions that are erythematous and pruritic he wonders if there's anything we can do to help that. 01/02/17 I reviewed patient's wound culture which was obtained his visit last week. He was placed on doxycycline at that point. Unfortunately that does not appear to be an antibiotic that would likely help with the situation however the pseudomonas noted on culture is sensitive to Cipro. Also unfortunately patient's wound seems to have a large compared to last week's evaluation. Not severely so but there are definitely increased measurements in general. He is continuing to have discomfort as well he writes this to be a seven out of 10. In fact he would prefer me not to perform any debridement today due to the fact that he is having discomfort and considering he has an active infection on the little reluctant to do so anyway. No fevers, [name], [name] ([id number]) chills, nausea, or vomiting noted at this time. 01/08/17; patient seems dermatology on  September 5. I suspect dermatology will want the slides from the biopsy I did sent to their pathologist. I'm not sure if there is a way we can expedite that. In any case the culture I did before I left on vacation 3 weeks ago showed Pseudomonas he was given 10 days of Cipro and per her description of her  intake nurses is actually somewhat better this week although the wound is quite a bit bigger than I remember the last time I saw this. He still has 3 more days of Cipro 01/21/17; dermatology appointment tomorrow. He has completed the ciprofloxacin for Pseudomonas. Surface of the wound looks better however he is had some deterioration in the lesions on his right leg. Meantime the left lateral leg wound we will continue with sample 01/29/17; patient had his dermatology appointment but I can't yet see that note. He is completed his antibiotics. The wound is more superficial but considerably larger in circumferential area than when he came in. This is in his left lateral calf. He also has swollen erythematous areas with superficial wounds on the right leg and small papular areas on both arms. There apparently areas in her his upper thighs and buttocks I did not look at those. Dermatology biopsied the right leg. Hopefully will have their input next week. 02/05/17; patient went back to see his dermatologist who told him that he had a "scratching problem" as well as staph. He is now on a 30 day course of doxycycline and I believe she gave him triamcinolone cream to the right leg areas to help with the itching [not exactly sure but probably triamcinolone]. She apparently looked at the left lateral leg wound although this was not rebiopsied and I think felt to be ultimately part of the same pathogenesis. He is using sample border foam and changing nevus himself. He now has a new open area on the right posterior leg which was his biopsy site I don't have any of the dermatology notes 02/12/17; we put the patient  in compression last week with SANTYL to the wound on the left leg and the biopsy. Edema is much better and the depth of the wound is now at level of skin. Area is still the same Biopsy site on the right lateral leg we've also been using santyl with a border foam dressing and he is changing this himself. 02/19/17; Using silver alginate started last week to both the substantial left leg wound and the biopsy site on the right wound. He is tolerating compression well. Has a an appointment with his primary M.D. tomorrow wondering about diuretics although I'm wondering if the edema problem is actually lymphedema 02/26/17; the patient has been to see his primary doctor Dr. Jerrel Ivory at [facility] our primary care. She started him on Lasix 20 mg and this seems to have helped with the edema. However we are not making substantial change with the left lateral calf wound and inflammation. The biopsy site on the right leg also looks stable but not really all that different. 03/12/17; the patient has been to see vein and vascular Dr. Lucky Cowboy. He has had venous reflux studies I have not reviewed these. I did get a call from his dermatology office. 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 They therefore recommended clobetasol and tetracycline i.e. 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 03/26/17; apparently the patient just had reflux studies not an appointment with Dr. dew. She arrives in clinic today having applied clobetasol for 2-3 weeks. He notes over the last 2-3 days excessive drainage having to change the dressing 3-4  times a day and also expanding erythema. He states the expanding erythema seems to come and go and was last this red  was earlier in the month.he is on doxycycline 150 mg twice a day as an anti-inflammatory systemic therapy for possible pyoderma gangrenosum along with the topical clobetasol 04/02/17; the patient was seen last week by Dr. Lillia Carmel at [facility] dermatology locally who kindly saw him at my request. 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 My concern was the degree of erythema medially extending from his left leg wound which was either inflammation from pyoderma or cellulitis. I put him on Augmentin however culture of the wound showed Pseudomonas which is quinolone sensitive. I really don't believe he has cellulitis however in view of everything I will continue and give him a course of Cipro. He is also on doxycycline as an immune modulator for the pyoderma. In addition to his original wound on the left lateral leg with surrounding erythema he has a wound on the right posterior calf which was an original biopsy site done by dermatology. 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 Saw Dr. Lillia Carmel yesterday. He has been using topical antibiotics to both wound areas his original wound on the left and the biopsies/pathergy area on the right. There is definitely some improvement in the inflammation around the wound on the right although the patient states he has increasing sensitivity of the wounds. He is on prednisone 60 and doxycycline 1 as prescribed by Dr. Lillia Carmel. He is covering the topical antibiotic with gauze and putting this in his own compression stocks and changing this daily. He states that Dr. Lottie Rater did a culture of the left leg wound yesterday 05/07/17; pyoderma gangrenosum. The patient saw Dr. Lillia Carmel yesterday and has a follow-up with her in one month. He is still using topical antibiotics to both wounds although he can't recall exactly what type. He is still on  prednisone 60 mg. Dr. Lillia Carmel stated that the doxycycline could stop if we were in agreement. 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 The right medial leg was induced by [name] ([id number]) biopsy/pathergy. The area on the right is essentially healed. 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 The right medial leg has closed and remains closed. He is still on prednisone 60. He tells me he missed his last dermatology appointment with Dr. Lillia Carmel but will make another appointment. He reports that her blood sugar at a recent screen in [place] was high 200's. He was 180 today. 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 I think he is going to require still much longer prednisone perhaps another 3 months before attempting to taper. In the meantime his wound is a lot better. Smaller. He is cleaning this off daily and applying topical antibiotics. When he was last in the clinic I thought about changing to [facility] and actually put in a couple of calls to dermatology although probably not during their business hours. 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 He continues on oral prednisone. He has been using triple antibiotic ointment. There is surface debris and  we will transition to [facility] and have him return in 2 weeks. He has lost 30 pounds since his last appointment with lifestyle modification. He may benefit from topical steroid cream for treatment this can be considered at a later date. 08/22/17 on evaluation today patient appears to actually be doing rather well in regard to his left lateral lower extremity ulcer. He has actually been managed by Dr. Dellia Nims  most recently. Patient is currently on oral steroids at this time. This seems to have been of benefit for him. Nonetheless his last visit was actually with Leah on 08/06/17. Currently he is not utilizing any topical steroid creams although this could be of benefit as well. No fevers, chills, nausea, or vomiting noted at this time. 09/05/17 on evaluation today patient appears to be doing better in regard to his left lateral lower extremity ulcer. He has been tolerating the dressing changes without complication. He is using Santyl with good effect. Overall I'm very pleased with how things are standing at this point. Patient likewise is happy that this is doing better. 09/19/17 on evaluation today patient actually appears to be doing rather well in regard to his left lateral lower extremity ulcer. 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 He's not having any significant pain. 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 He has been tolerating the Santyl without complication and in general I feel like he's making good progress. 10/17/17 on evaluation today patient appears to be doing very well in regard to his left lateral lower surety ulcer. He has been tolerating the dressing changes without complication. There does not appear to be any evidence of infection he's alternating the Santyl and the triple antibiotic ointment every other day this seems to be doing well for him. 11/03/17 on evaluation today patient appears to be doing very well in regard to his left lateral lower extremity ulcer. He is been tolerating the dressing changes without complication which is good news. Fortunately there does not appear to be any evidence of infection which is also great news. Overall is doing excellent they are starting to taper down on  the prednisone is down to 40 mg at this point it also started topical clobetasol for him. 11/17/17 on evaluation today patient appears to be doing well in regard to his left lateral lower surety ulcer. He's been tolerating the dressing changes without complication. He does note that he is having no pain, no excessive drainage or discharge, and overall he feels like things are going about how he would expect and hope they would. Overall he seems to have no evidence of infection at this time in my opinion which is good news. 12/04/17-He is seen in follow-up evaluation for right lateral lower extremity ulcer. He has been applying topical steroid cream. Today's measurement show slight increase in size. Over the next 2 weeks we will transition to every other day Santyl and steroid cream. He has been encouraged to monitor for changes and notify clinic with any concerns 12/15/17 on evaluation today patient's left lateral motion the ulcer and fortunately is doing worse again at this point. This just since last week to this week has close to doubled in size according to the patient. I did not seeing last week's I do not have a visual to compare this to in our system  was also down so we do not have all the charts and at this point. Nonetheless it does have me somewhat concerned in regard to the fact that again he was worried enough about it he has contact the dermatology that placed them back on the full strength, 50 mg a day of the prednisone that he was taken previous. He continues to alternate using clobetasol along with Santyl at this point. He is obviously somewhat frustrated. 12/22/17 on evaluation today patient appears to be doing a little worse compared to last evaluation. Unfortunately the wound is a little deeper and slightly larger than the last week's evaluation. With that being said he has made some progress in regard to the irritation surrounding at this time unfortunately despite that progress that's  been made he still has a significant issue going on here. I'm not certain that he is having really any true infection at this time although with the Pyoderma gangrenosum it can [name], [name] ([id number]) sometimes be difficult to differentiate infection versus just inflammation. For that reason I discussed with him today the possibility of perform a wound culture to ensure there's nothing overtly infected. 01/06/18 on evaluation today patient's wound is larger and deeper than previously evaluated. With that being said it did appear that his wound was infected after my last evaluation with him. Subsequently I did end up prescribing a prescription for Bactrim DS which she has been taking and having no complication with. Fortunately there does not appear to be any evidence of infection at this point in time as far as anything spreading, no want to touch, and overall I feel like things are showing signs of improvement. 01/13/18 on evaluation today patient appears to be even a little larger and deeper than last time. There still muscle exposed in the base of the wound. Nonetheless he does appear to be less erythematous I do believe inflammation is calming down also believe the infection looks like it's probably resolved at this time based on what I'm seeing. No fevers, chills, nausea, or vomiting noted at this time. 01/30/18 on evaluation today patient actually appears to visually look better for the most part. Unfortunately those visually this looks better he does seem to potentially have what may be an abscess in the muscle that has been noted in the central portion of the wound. This is the first time that I have noted what appears to be fluctuance in the central portion of the muscle. With that being said I'm somewhat more concerned about the fact that this might indicate an abscess formation at this location. I do believe that an ultrasound would be appropriate. This is likely something we need to try  to do as soon as possible. He has been switch to mupirocin ointment and he is no longer using the steroid ointment as prescribed by dermatology he sees them again next week he's been decreased from 60 to 40 mg of prednisone. Patient History Information obtained from Patient. Allergies milk, Biaxin, seasonal Family History Diabetes - Mother,Maternal Grandparents, Heart Disease - Mother,Maternal Grandparents, Hereditary Spherocytosis - Siblings, Hypertension - Maternal Grandparents, Stroke - Siblings, No family history of Cancer, Kidney Disease, Lung Disease, Seizures, Thyroid Problems, Tuberculosis. Social History Current some day smoker, Marital Status - Married, Alcohol Use - Rarely, Drug Use - No History, Caffeine Use - Moderate. Review of Systems (ROS) Constitutional Symptoms (General Health) Denies complaints or symptoms of Fever, Chills. Respiratory The patient has no complaints or symptoms. Cardiovascular The patient has no complaints or  symptoms. Psychiatric The patient has no complaints or symptoms. Objective Constitutional [name], [name] ([id number]) Well-nourished and well-hydrated in no acute distress. Vitals Time Taken: 8:03 AM, Height: 71 in, Weight: 338 lbs, BMI: 47.1, Temperature: 98.3 F, Pulse: 94 bpm, Respiratory Rate: 16 breaths/min, Blood Pressure: 126/76 mmHg. Respiratory normal breathing without difficulty. clear to auscultation bilaterally. Cardiovascular regular rate and rhythm with normal S1, S2. Psychiatric this patient is able to make decisions and demonstrates good insight into disease process. Alert and Oriented x 3. pleasant and cooperative. General Notes: Patient's wound bed at this point again shows some signs of improvement in regard to the erythema in particular. With that being said I am concerned about the fact it appears he may have an abscess in the deep portion of the muscle which is potentially a little bit more significant than something I  would want to just take care of here in the office obviously. With that being said I do believe an ultrasound to identify how deep and the extent of this may be would be appropriate. I definitely do not want to cut into the muscle obviously if there's no need to but again I'm fairly confident he may have developed an abscess in this location. Integumentary (Hair, Skin) Wound #1 status is Open. Original cause of wound was Gradually Appeared. The wound is located on the Left,Lateral Lower Leg. The wound measures 3.2cm length x 4cm width x 1cm depth; 10.053cm^2 area and 10.053cm^3 volume. There is Fat Layer (Subcutaneous Tissue) Exposed exposed. There is no undermining noted, however, there is tunneling at 10:00 with a maximum distance of 1.5cm. There is a large amount of serous drainage noted. The wound margin is distinct with the outline attached to the wound base. There is medium (34-66%) red, pink granulation within the wound bed. There is a medium (34- 66%) amount of necrotic tissue within the wound bed including Adherent Slough. The periwound skin appearance did not exhibit: Callus, Crepitus, Excoriation, Induration, Rash, Scarring, Dry/Scaly, Maceration, Atrophie Blanche, Cyanosis, Ecchymosis, Hemosiderin Staining, Mottled, Pallor, Rubor, Erythema. Periwound temperature was noted as No Abnormality. The periwound has tenderness on palpation. 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 Cleanse wound with mild soap and water KIPLING, GRASER ([id number]) May Shower, gently pat wound dry prior to applying new dressing. Anesthetic (add to Medication List): Wound #1 Left,Lateral Lower Leg: Topical Lidocaine 4% cream applied to wound bed prior to debridement (In Clinic Only). Skin  Barriers/Peri-Wound Care: Wound #1 Left,Lateral Lower Leg: Triamcinolone Acetonide Ointment (TCA) - apply to wound Other: - uses clabetazole at home, alternate this cream and santyl on wound Primary Wound Dressing: Wound #1 Left,Lateral Lower Leg: Dry Gauze Mupirocin Ointment Secondary Dressing: Wound #1 Left,Lateral Lower Leg: Boardered Foam Dressing Dressing Change Frequency: Wound #1 Left,Lateral Lower Leg: Change dressing every day. Follow-up Appointments: Wound #1 Left,Lateral Lower Leg: Return Appointment in 1 week. Edema Control: Wound #1 Left,Lateral Lower Leg: Patient to wear own compression stockings Additional Orders / Instructions: Wound #1 Left,Lateral Lower Leg: Stop Smoking Increase protein intake. ordered were: Ultrasound Left Lower Leg The following medication(s) was prescribed: Bactrim DS oral 800 mg-160 mg tablet 1 1 tablet oral taken 2 times a day for 7 days starting 01/30/2018 At this point I do believe that the patient is going to require a ultrasound to further evaluate for the possibility of a  abscess with in the muscle of the left lateral lower extremity. He understands. I'm in a see about getting this scheduled for him as soon as possible. Being that he's tested positive for MRSA I think that it is extremely important for [place] to go ahead and know whether or not this may have developed an abscess at the site. This is despite having been on the antibiotics for roughly 3 weeks at this point. I am going to extend his antibiotics for another seven days additional past what I had previously given him. He is in agreement the plan. Subsequently I will see him back for reevaluation in one weeks time. Please see above for specific wound care orders. We will see patient for re-evaluation in 1 week(s) here in the clinic. If anything worsens or changes patient will contact our office for additional recommendations. Electronic Signature(s) Signed: 01/30/2018 8:54:03 AM  By: Worthy Keeler PA-C Entered By: Worthy Keeler on 01/30/2018 08:49:49 [name], [name] ([id number]) -------------------------------------------------------------------------------- ROS/PFSH Details Patient Name: [name] Date of Service: 01/30/2018 8:00 AM Medical Record Number: [id number] Patient Account Number: [id number] Date of Birth/Sex: [birth year] (37 y.o. M) Treating RN: Montey Hora Primary Care Provider: [name] Other Clinician: Referring Provider: [name] Treating Provider/Extender: Melburn Hake, Henning Ehle Weeks in Treatment: 15 Information Obtained From Patient Wound History Do you currently have one or more open woundso Yes How many open wounds do you currently haveo 1 Approximately how long have you had your woundso 1 year How have you been treating your wound(s) until nowo neosporin Has your wound(s) ever healed and then re-openedo No Have you had any lab work done in the past montho No Have you tested positive for an antibiotic resistant organism (MRSA, VRE)o No Have you tested positive for osteomyelitis (bone infection)o No Have you had any tests for circulation on your legso No Have you had other problems associated with your woundso Infection, Swelling Constitutional Symptoms (General Health) Complaints and Symptoms: Negative for: Fever; Chills Eyes Medical History: Negative for: Cataracts; Glaucoma; Optic Neuritis Ear/Nose/Mouth/Throat Medical History: Negative for: Chronic sinus problems/congestion; Middle ear problems Hematologic/Lymphatic Medical History: Negative for: Anemia; Hemophilia; Human Immunodeficiency Virus; Lymphedema; Sickle Cell Disease Respiratory Complaints and Symptoms: No Complaints or Symptoms Medical History: Positive for: Sleep Apnea - cpap Negative for: Aspiration; Asthma; Chronic Obstructive Pulmonary Disease (COPD); Pneumothorax Cardiovascular Complaints and Symptoms: No Complaints or Symptoms Medical  History: Positive for: Hypertension [name], [name] ([id number]) Negative for: Angina; Arrhythmia; Congestive Heart Failure; Coronary Artery Disease; Deep Vein Thrombosis; Hypotension; Myocardial Infarction; Peripheral Arterial Disease; Peripheral Venous Disease; Phlebitis; Vasculitis Gastrointestinal Medical History: Positive for: Colitis Endocrine Medical History: Negative for: Type I Diabetes; Type II Diabetes Genitourinary Medical History: Negative for: End Stage Renal Disease Immunological Medical History: Negative for: Lupus Erythematosus; Raynaudos; Scleroderma Integumentary (Skin) Medical History: Negative for: History of Burn; History of pressure wounds Musculoskeletal Medical History: Negative for: Gout; Rheumatoid Arthritis; Osteoarthritis; Osteomyelitis Neurologic Medical History: Negative for: Dementia; Neuropathy; Quadriplegia; Paraplegia; Seizure Disorder Oncologic Medical History: Negative for: Received Chemotherapy; Received Radiation Psychiatric Complaints and Symptoms: No Complaints or Symptoms Medical History: Negative for: Anorexia/bulimia; Confinement Anxiety Immunizations Pneumococcal Vaccine: Received Pneumococcal Vaccination: No Implantable Devices Family and Social History [name], [name] ([id number]) Cancer: No; Diabetes: Yes - Mother,Maternal Grandparents; Heart Disease: Yes - Mother,Maternal Grandparents; Hereditary Spherocytosis: Yes - Siblings; Hypertension: Yes - Maternal Grandparents; Kidney Disease: No; Lung Disease: No; Seizures: No; Stroke: Yes - Siblings; Thyroid Problems: No; Tuberculosis: No; Current some day smoker; Marital Status - Married; Alcohol  Use: Rarely; Drug Use: No History; Caffeine Use: Moderate; Financial Concerns: No; Food, Clothing or Shelter Needs: No; Support System Lacking: No; Transportation Concerns: No; Advanced Directives: No; Patient does not want information on Advanced Directives; Living Will: No Physician  Affirmation I have reviewed and agree with the above information. Electronic Signature(s) Signed: 01/30/2018 8:54:03 AM By: Worthy Keeler PA-C Signed: 02/02/2018 4:11:20 PM By: Montey Hora Entered By: Worthy Keeler on 01/30/2018 08:46:52 [name], [name] ([id number]) -------------------------------------------------------------------------------- SuperBill Details Patient Name: [name] Date of Service: 01/30/2018 Medical Record Number: [id number] Patient Account Number: [id number] Date of Birth/Sex: Jul 11, 1980 (37 y.o. 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 I87.2 Venous insufficiency (chronic) (peripheral) Facility Procedures CPT4 Code: [id number] Description: 20233 - WOUND CARE VISIT-LEV 3 EST PT Modifier: Quantity: 1 Physician Procedures CPT4 Code: [id number] Description: 68372 - WC PHYS LEVEL 4 - EST PT ICD-10 Diagnosis Description L97.222 Non-pressure chronic ulcer of left calf with fat layer expo L88 Pyoderma gangrenosum I87.2 Venous insufficiency (chronic) (peripheral) Modifier: sed Quantity: 1 Electronic Signature(s) Signed: 01/30/2018 10:43:54 PM By: Worthy Keeler PA-C Signed: 02/02/2018 4:11:20 PM By: Montey Hora Previous Signature: 01/30/2018 8:54:03 AM Version By: Worthy Keeler PA-C Entered By: Montey Hora on 01/30/2018 08:58:35

## 2018-02-04 NOTE — Progress Notes (Signed)
TORIBIO, SEIBER ([id number]) Visit Report for 01/30/2018 Allergy List Details Patient Name: [name], [name] Date of Service: 01/30/2018 8:00 AM Medical Record Number: [id number] Patient Account Number: [id number] Date of Birth/Sex: Aug 19, 1980 (37 y.o. M) Treating RN: Montey Hora Primary Care Micah Barnier: [name] Other Clinician: Referring Demetres Prochnow: [name] Treating Jonavan Vanhorn/Extender: STONE III, [name]: 60 Allergies Active Allergies milk Biaxin seasonal Allergy Notes Electronic Signature(s) Signed: 02/02/2018 4:11:20 PM By: Montey Hora Entered By: Montey Hora on 01/30/2018 08:39:07 [name] ([id number]) -------------------------------------------------------------------------------- Arrival Information Details Patient Name: [name] Date of Service: 01/30/2018 8:00 AM Medical Record Number: [id number] Patient Account Number: [id number] Date of Birth/Sex: 1980/09/14 (37 y.o. M) Treating RN: Montey Hora Primary Care Arlon Bleier: [name] Other Clinician: Referring Tyge Somers: [name] Treating Tanveer Brammer/Extender: Melburn Hake, [name]: 43 Visit Information Patient Arrived: Ambulatory Arrival Time: 08:03 Accompanied By: self Transfer Assistance: None Patient Identification Verified: Yes Secondary Verification Process Completed: Yes Patient Requires Transmission-Based No Precautions: Patient Has Alerts: No History Since Last Visit Any new allergies or adverse reactions: No Had a fall or experienced change in activities of daily living that may affect risk of falls: No Signs or symptoms of abuse/neglect since last visito No Hospitalized since last visit: No Implantable device outside of the clinic excluding cellular tissue based products placed in the center since last visit: No Has Dressing in Place as Prescribed: Yes Pain Present Now: No Electronic Signature(s) Signed: 01/30/2018 10:27:44 AM By: Lorine Bears RCP, RRT, CHT Entered By: Lorine Bears on 01/30/2018 08:04:47 [name] ([id number]) -------------------------------------------------------------------------------- Clinic Level of Care Assessment Details Patient Name: [name] Date of Service: 01/30/2018 8:00 AM Medical Record Number: [id number] Patient Account Number: [id number] Date of Birth/Sex: [birth year] (37 y.o. M) Treating RN: Montey Hora Primary Care Nikoleta Dady: [name] Other Clinician: Referring Jahniya Duzan: [name] Treating  Jon/Extender: Melburn Hake, [name]: 60 Clinic Level of Care Assessment Items TOOL 4 Quantity Score []  - Use when only an EandM is performed on FOLLOW-UP visit 0 ASSESSMENTS - Nursing Assessment / Reassessment X - Reassessment of Co-morbidities (includes updates in patient status) 1 10 X- 1 5 Reassessment of Adherence to Treatment Plan ASSESSMENTS - Wound and Skin Assessment / Reassessment X - Simple Wound Assessment / Reassessment - one wound 1 5 []  - 0 Complex Wound Assessment / Reassessment - multiple wounds []  - 0 Dermatologic / Skin Assessment (not related to wound area) ASSESSMENTS - Focused Assessment X - Circumferential Edema Measurements - multi extremities 1 5 []  - 0 Nutritional Assessment / Counseling / Intervention X- 1 5 Lower Extremity Assessment (monofilament, tuning fork, pulses) []  - 0 Peripheral Arterial Disease Assessment (using hand held doppler) ASSESSMENTS - Ostomy and/or Continence Assessment and Care []  - Incontinence Assessment and Management 0 []  - 0 Ostomy Care Assessment and Management (repouching, etc.) PROCESS - Coordination of Care X - Simple Patient / Family Education for ongoing care 1 15 []  - 0 Complex (extensive) Patient / Family Education for ongoing care []  - 0 Staff obtains Programmer, systems, Records, Test Results / Process Orders []  - 0 Staff telephones HHA, Nursing Homes / Clarify  orders / etc []  - 0 Routine Transfer to another Facility (non-emergent condition) []  - 0 Routine Hospital Admission (non-emergent condition) []  - 0 New Admissions / Biomedical engineer / Ordering NPWT, Apligraf, etc. []  - 0 Emergency Hospital Admission (emergent condition) X- 1 10 Simple Discharge Coordination Palmeri, Daley ([id number]) []  - 0 Complex (extensive) Discharge Coordination PROCESS - Special  Needs []  - Pediatric / Minor Patient Management 0 []  - 0 Isolation Patient Management []  - 0 Hearing / Language / Visual special needs []  - 0 Assessment of Community assistance (transportation, D/C planning, etc.) []  - 0 Additional assistance / Altered mentation []  - 0 Support Surface(s) Assessment (bed, cushion, seat, etc.) INTERVENTIONS - Wound Cleansing / Measurement X - Simple Wound Cleansing - one wound 1 5 []  - 0 Complex Wound Cleansing - multiple wounds X- 1 5 Wound Imaging (photographs - any number of wounds) []  - 0 Wound Tracing (instead of photographs) X- 1 5 Simple Wound Measurement - one wound []  - 0 Complex Wound Measurement - multiple wounds INTERVENTIONS - Wound Dressings X - Small Wound Dressing one or multiple wounds 1 10 []  - 0 Medium Wound Dressing one or multiple wounds []  - 0 Large Wound Dressing one or multiple wounds X- 1 5 Application of Medications - topical []  - 0 Application of Medications - injection INTERVENTIONS - Miscellaneous []  - External ear exam 0 []  - 0 Specimen Collection (cultures, biopsies, blood, body fluids, etc.) []  - 0 Specimen(s) / Culture(s) sent or taken to Lab for analysis []  - 0 Patient Transfer (multiple staff / Civil Service fast streamer / Similar devices) []  - 0 Simple Staple / Suture removal (25 or less) []  - 0 Complex Staple / Suture removal (26 or more) []  - 0 Hypo / Hyperglycemic Management (close monitor of Blood Glucose) []  - 0 Ankle / Brachial Index (ABI) - do not check if billed separately X- 1 5 Vital  Signs Holwerda, Ector ([id number]) Has the patient been seen at the hospital within the last three years: Yes Total Score: 90 Level Of Care: New/Established - Level 3 Electronic Signature(s) Signed: 02/02/2018 4:11:20 PM By: Montey Hora Entered By: Montey Hora on 01/30/2018 08:58:25 [name] ([id number]) -------------------------------------------------------------------------------- Encounter Discharge Information Details Patient Name: [name] Date of Service: 01/30/2018 8:00 AM Medical Record Number: [id number] Patient Account Number: [id number] Date of Birth/Sex: [birth year] (37 y.o. M) Treating RN: Montey Hora Primary Care Twisha Vanpelt: [name] Other Clinician: Referring Chantavia Bazzle: [name] Treating Reyes Fifield/Extender: Melburn Hake, [name]: 64 Encounter Discharge Information Items Discharge Condition: Stable Ambulatory Status: Ambulatory Discharge Destination: Home Transportation: Private Auto Accompanied By: self Schedule Follow-up Appointment: Yes Clinical Summary of Care: Electronic Signature(s) Signed: 02/02/2018 4:11:20 PM By: Montey Hora Entered By: Montey Hora on 01/30/2018 08:59:43 [name] ([id number]) -------------------------------------------------------------------------------- Lower Extremity Assessment Details Patient Name: [name] Date of Service: 01/30/2018 8:00 AM Medical Record Number: [id number] Patient Account Number: [id number] Date of Birth/Sex: 1980/12/20 (37 y.o. M) Treating RN: Roger Shelter Primary Care Henery Betzold: [name] Other Clinician: Referring Zylan Almquist: [name] Treating Prisila Dlouhy/Extender: Melburn Hake, [name]: 20 Vascular Assessment Claudication: Claudication Assessment [Left:None] Pulses: Dorsalis Pedis Palpable: [Left:Yes] Posterior Tibial Extremity colors, hair growth, and conditions: Extremity Color: [Left:Normal] Hair Growth on Extremity:  [Left:Yes] Temperature of Extremity: [Left:Warm] Capillary Refill: [Left:< 3 seconds] Toe Nail Assessment Left: Right: Thick: No Discolored: No Deformed: No Improper Length and Hygiene: No Electronic Signature(s) Signed: 01/30/2018 1:16:34 PM By: Roger Shelter Entered By: Roger Shelter on 01/30/2018 08:19:34 [name] ([id number]) -------------------------------------------------------------------------------- Multi Wound Chart Details Patient Name: [name] Date of Service: 01/30/2018 8:00 AM Medical Record Number: [id number] Patient Account Number: [id number] Date of Birth/Sex: Jun 13, 1980 (37 y.o. M) Treating RN: Montey Hora Primary Care Eldrick Penick: [name] Other Clinician: Referring Dalton Molesworth: [name] Treating Greidy Sherard/Extender: Melburn Hake, [name]: 60 Vital Signs Height(in): 71 Pulse(bpm): 94  Weight(lbs): 338 Blood Pressure(mmHg): 126/76 Body Mass Index(BMI): 47 Temperature(F): 98.3 Respiratory Rate 16 (breaths/min): Photos: [1:No Photos] [N/A:N/A] Wound Location: [1:Left Lower Leg - Lateral] [N/A:N/A] Wounding Event: [1:Gradually Appeared] [N/A:N/A] 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] [N/A:N/A] Comorbid History: [1:Sleep Apnea, Hypertension, Colitis] [N/A:N/A] Date Acquired: [1:11/18/2015] [N/A:N/A] Weeks of Treatment: [1:60] [N/A:N/A] Wound Status: [1:Open] [N/A:N/A] Measurements L x W x D [1:3.2x4x1] [N/A:N/A] (cm) Area (cm) : [1:10.053] [N/A:N/A] Volume (cm) : [1:10.053] [N/A:N/A] % Reduction in Area: [1:-104.80%] [N/A:N/A] % Reduction in Volume: [1:-156.00%] [N/A:N/A] Position 1 (o'clock): [1:10] Maximum Distance 1 (cm): [1:1.5] Tunneling: [1:Yes] [N/A:N/A] Classification: [1:Full Thickness Without Exposed Support Structures] [N/A:N/A] Exudate Amount: [1:Large] [N/A:N/A] Exudate Type: [1:Serous] [N/A:N/A] Exudate Color: [1:amber] [N/A:N/A] Wound Margin: [1:Distinct, outline attached] [N/A:N/A] Granulation  Amount: [1:Medium (34-66%)] [N/A:N/A] Granulation Quality: [1:Red, Pink] [N/A:N/A] Necrotic Amount: [1:Medium (34-66%)] [N/A:N/A] Exposed Structures: [1:Fat Layer (Subcutaneous Tissue) Exposed: Yes Fascia: No Tendon: No Muscle: No Joint: No Bone: No] [N/A:N/A] Epithelialization: [1:None] [N/A:N/A] Periwound Skin Texture: [1:Excoriation: No Induration: No] [N/A:N/A] Callus: No Crepitus: No Rash: No Scarring: No Periwound Skin Moisture: Maceration: No N/A N/A Dry/Scaly: No Periwound Skin Color: Atrophie Blanche: No N/A N/A Cyanosis: No Ecchymosis: No Erythema: No Hemosiderin Staining: No Mottled: No Pallor: No Rubor: No Temperature: No Abnormality N/A N/A Tenderness on Palpation: Yes N/A N/A Wound Preparation: Ulcer Cleansing: N/A N/A Rinsed/Irrigated with Saline Topical Anesthetic Applied: Other: lidocaine 4% Treatment Notes Electronic Signature(s) Signed: 02/02/2018 4:11:20 PM By: Montey Hora Entered By: Montey Hora on 01/30/2018 08:33:54 NEAMIAH, SCIARRA ([id number]) -------------------------------------------------------------------------------- Multi-Disciplinary Care Plan Details Patient Name: [name] Date of Service: 01/30/2018 8:00 AM Medical Record Number: [id number] Patient Account Number: [id number] Date of Birth/Sex: February 10, 1981 (37 y.o. M) Treating RN: Montey Hora Primary Care Frances Ambrosino: [name] Other Clinician: Referring Mosi Hannold: [name] Treating Reed Eifert/Extender: Melburn Hake, [name]: 45 Active Inactive ` Orientation to the Wound Care Program Nursing Diagnoses: Knowledge deficit related to the wound healing center program Goals: Patient/caregiver will verbalize understanding of the [facility] Program Date Initiated: 12/11/2016 Target Resolution Date: 03/13/2017 Goal Status: Active Interventions: Provide education on orientation to the wound center Notes: ` Venous Leg Ulcer Nursing  Diagnoses: Knowledge deficit related to disease process and management Potential for venous Insuffiency (use before diagnosis confirmed) Goals: Non-invasive venous studies are completed as ordered Date Initiated: 12/11/2016 Target Resolution Date: 03/13/2017 Goal Status: Active Patient will maintain optimal edema control Date Initiated: 12/11/2016 Target Resolution Date: 03/13/2017 Goal Status: Active Patient/caregiver will verbalize understanding of disease process and disease management Date Initiated: 12/11/2016 Target Resolution Date: 03/13/2017 Goal Status: Active Verify adequate tissue perfusion prior to therapeutic compression application Date Initiated: 12/11/2016 Target Resolution Date: 03/13/2017 Goal Status: Active Interventions: Assess peripheral edema status every visit. Compression as ordered Provide education on venous insufficiency Treatment Activities: QUINTIN, HJORT ([id number]) Therapeutic compression applied : 12/11/2016 Notes: ` Wound/Skin Impairment Nursing Diagnoses: Impaired tissue integrity Knowledge deficit related to ulceration/compromised skin integrity Goals: Patient/caregiver will verbalize understanding of skin care regimen Date Initiated: 12/11/2016 Target Resolution Date: 03/13/2017 Goal Status: Active Ulcer/skin breakdown will have a volume reduction of 30% by week 4 Date Initiated: 12/11/2016 Target Resolution Date: 03/13/2017 Goal Status: Active Ulcer/skin breakdown will have a volume reduction of 50% by week 8 Date Initiated: 12/11/2016 Target Resolution Date: 03/13/2017 Goal Status: Active Ulcer/skin breakdown will have a volume reduction of 80% by week 12 Date Initiated: 12/11/2016 Target Resolution Date: 03/13/2017 Goal Status: Active Ulcer/skin breakdown will heal within 14 weeks Date Initiated: 12/11/2016 Target Resolution Date: 03/13/2017 Goal  Status: Active Interventions: Assess patient/caregiver ability to obtain necessary  supplies Assess patient/caregiver ability to perform ulcer/skin care regimen upon admission and as needed Assess ulceration(s) every visit Provide education on ulcer and skin care Treatment Activities: Skin care regimen initiated : 12/11/2016 Topical wound management initiated : 12/11/2016 Notes: Electronic Signature(s) Signed: 02/02/2018 4:11:20 PM By: Montey Hora Entered By: Montey Hora on 01/30/2018 08:33:46 Phifer, [name] ([id number]) -------------------------------------------------------------------------------- Pain Assessment Details Patient Name: [name] Date of Service: 01/30/2018 8:00 AM Medical Record Number: [id number] Patient Account Number: [id number] Date of Birth/Sex: 11-06-1980 (37 y.o. M) Treating RN: Montey Hora Primary Care Aleigha Gilani: [name] Other Clinician: Referring Karem Tomaso: [name] Treating [name] [name]/Extender: Melburn Hake, [name]: 75 Active Problems Location of Pain Severity and Description of Pain Patient Has Paino No Site Locations Pain Management and Medication Current Pain Management: Electronic Signature(s) Signed: 01/30/2018 10:27:44 AM By: Lorine Bears RCP, RRT, CHT Signed: 02/02/2018 4:11:20 PM By: Montey Hora Entered By: Lorine Bears on 01/30/2018 08:04:54 [name] ([id number]) -------------------------------------------------------------------------------- Patient/Caregiver Education Details Patient Name: [name] Date of Service: 01/30/2018 8:00 AM Medical Record Number: [id number] Patient Account Number: [id number] Date of Birth/Gender: Sep 27, 1980 (37 y.o. M) Treating RN: Montey Hora Primary Care Physician: [name] Other Clinician: Referring Physician: [name] Treating Physician/Extender: Sharalyn Ink in Treatment: 51 Education Assessment Education Provided To: Patient Education Topics Provided Wound/Skin  Impairment: Handouts: Other: wound care as ordered Methods: Demonstration, Explain/Verbal Responses: State content correctly Electronic Signature(s) Signed: 02/02/2018 4:11:20 PM By: Montey Hora Entered By: Montey Hora on 01/30/2018 09:00:02 [name] ([id number]) -------------------------------------------------------------------------------- Wound Assessment Details Patient Name: [name] Date of Service: 01/30/2018 8:00 AM Medical Record Number: [id number] Patient Account Number: [id number] Date of Birth/Sex: 1980-10-09 (37 y.o. M) Treating RN: Roger Shelter Primary Care Orvil Faraone: [name] Other Clinician: Referring Veronique Warga: [name] Treating Masa Lubin/Extender: Melburn Hake, [name]: 60 Wound Status Wound Number: 1 Primary Etiology: Pyoderma Wound Location: Left Lower Leg - Lateral Wound Status: Open Wounding Event: Gradually Appeared Comorbid History: Sleep Apnea, Hypertension, Colitis Date Acquired: 11/18/2015 Weeks Of Treatment: 60 Clustered Wound: No Photos Photo Uploaded By: Roger Shelter on 01/30/2018 17:50:49 Wound Measurements Length: (cm) 3.2 Width: (cm) 4 Depth: (cm) 1 Area: (cm) 10.053 Volume: (cm) 10.053 % Reduction in Area: -104.8% % Reduction in Volume: -156% Epithelialization: None Tunneling: Yes Position (o'clock): 10 Maximum Distance: (cm) 1.5 Undermining: No Wound Description Full Thickness Without Exposed Support Classification: Structures Wound Margin: Distinct, outline attached Exudate Large Amount: Exudate Type: Serous Exudate Color: amber Foul Odor After Cleansing: No Slough/Fibrino Yes Wound Bed Granulation Amount: Medium (34-66%) Exposed Structure Granulation Quality: Red, Pink Fascia Exposed: No Necrotic Amount: Medium (34-66%) Fat Layer (Subcutaneous Tissue) Exposed: Yes Necrotic Quality: Adherent Slough Tendon Exposed: No Gaspard, Virgle ([id number]) Muscle Exposed: No Joint  Exposed: No Bone Exposed: No Periwound Skin Texture Texture Color No Abnormalities Noted: No No Abnormalities Noted: No Callus: No Atrophie Blanche: No Crepitus: No Cyanosis: No Excoriation: No Ecchymosis: No Induration: No Erythema: No Rash: No Hemosiderin Staining: No Scarring: No Mottled: No Pallor: No Moisture Rubor: No No Abnormalities Noted: No Dry / Scaly: No Temperature / Pain Maceration: No Temperature: No Abnormality Tenderness on Palpation: Yes Wound Preparation Ulcer Cleansing: Rinsed/Irrigated with Saline Topical Anesthetic Applied: Other: lidocaine 4%, Treatment Notes Wound #1 (Left, Lateral Lower Leg) 1. Cleansed with: Clean wound with Normal Saline 2. Anesthetic Topical Lidocaine 4% cream to wound bed prior to debridement 3. Peri-wound Care: Skin Prep 4. Dressing  Applied: Dry Gauze Other dressing (specify in notes) 5. Secondary Dressing Applied Bordered Foam Dressing Notes mupirocin Electronic Signature(s) Signed: 01/30/2018 1:16:34 PM By: Roger Shelter Entered By: Roger Shelter on 01/30/2018 08:18:30 [name] ([id number]) -------------------------------------------------------------------------------- [place] Details Patient Name: [name] Date of Service: 01/30/2018 8:00 AM Medical Record Number: [id number] Patient Account Number: [id number] Date of Birth/Sex: 1980/10/11 (37 y.o. M) Treating RN: Montey Hora Primary Care Kailan Laws: [name] Other Clinician: Referring Letcher Schweikert: [name] Treating Whitnee Orzel/Extender: Melburn Hake, [name]: 60 Vital Signs Time Taken: 08:03 Temperature (F): 98.3 Height (in): 71 Pulse (bpm): 94 Weight (lbs): 338 Respiratory Rate (breaths/min): 16 Body Mass Index (BMI): 47.1 Blood Pressure (mmHg): 126/76 Reference Range: 80 - 120 mg / dl Electronic Signature(s) Signed: 01/30/2018 10:27:44 AM By: Lorine Bears RCP, RRT, CHT Entered By: Becky Sax, Amado Nash on 01/30/2018 03:75:43

## 2018-02-06 ENCOUNTER — Ambulatory Visit: Payer: 59 | Admitting: Physician Assistant

## 2018-02-06 NOTE — Unmapped (Signed)
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 Wound is not overly concerning for PG at this point although he is certainly at high risk. MRSA infection is likely driving persistence of wound. Will continue to wean off prednisone  - cont bactrim per wound clinic  - Decrease prednisone by 10 mg weekly to 10 mg daily given concomitant infection.   -g6pd to evaluate starting dapsone in the setting of avoid prednisone  - humira denied but will appeal given persistent recalcitrant PG in the setting of recurrent infections and side effects from prednisone with weight gain and cushingoid effects, pt has been on prednisone since November 2018  -  bactroban ointment to wound, replacing clobetasol  - Cont OTC calcium and Vit D  -  Recommend compression to alleviate venous stasis component    High risk medication use: previous hiv, hep b/c unremarkable on 10/18  -quant gold neg in 919  -g6pd today - for consideration of dapsone    Humira risks: Discussed risks of infection, potential risk of malignancy, injection site reactions. No hx of CHF, MS, lupus, malignancy, hepatitis, TB      RTC: 2  weeks  SUBJECTIVE    CC: f/u ulcers on leg    HPI:  This is a pleasant 37 y.o. male who presents today for follow up of non-healing ulcers. LV with myself on 9/19.Was seen by wound and culture demonstrated MRSA, now on bactrim with some improvement although somewhat stagnant. Unfortunately now has a large ulcer and concern for recurrence of his PG although does not have new spots. Continues to not have new spots of PG but struggling with healing of his wound.      Disease history:  -Starting about 2013, he started to develop itchy bumps that enlarge when he scratches and picks at them. This initially affected just his lower legs, but more recently prior to initial presentation to our clinic, involvement spread to his thighs, buttocks, arms, and forearms.  -Areas start as tiny bumps that he then scratches. 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   -In spring 2018, an ulcer started on the L lateral calf and gradually enlarged, but was slowly healing and shrinking by the time of presentation to our clinic. Denies any trauma at this site and notes that it started similar to other rashes on skin. Prior treatments include oral abx, including as recently as 12/2016 with courses of clinda and doxy, as well as topical steroids. He has h/o ulcerative colitis, now in remission without meds since 2015. Denies h/o liver or kidney disease or DM. Used to live in [place] when this started. Works for [employer] and is on feet all day, endorses swelling of legs. Denies new meds prior to onset.  - At his initial visit 01/22/2017, clobetasol ointment and doxycycline 100mg  bid x 4 weeks were started. A biopsy of a keratotic pruritic plaque was obtained. 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 Tissue culture grew 4+ MSSA. 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   - w/up for underlying pruritus (given patient report and difficulty not picking at the involved areas) on 10/17 revealed unremarkable HIV, hepatitis panel, LDH, TSH, free T4 . Outside slides  from previous bx were reviewed by Dr. Eyvonne Left and showed venous stasis changes as well as a neutrophilic infiltrate that could be consistent with PG. He was started on topical clobetasol BID. He was continued on PO doxycycline for additional 6 weeks. His left leg ulcer continued to grow and the site of his previous biopsy on the posterior right leg has also developed a large non healing ulcer.   - Repeat biopsy as below done on 03/26/17 primarily c/w PG vs infectious with one potential organism on Fite stain but unclear if related.   - Patient started on prednisone on 04/01/17 with almost resolution by 7/19.  -Patient has recurrence of PG in the setting of MRSA - start process for humira  Final Diagnosis   Date Value Ref Range Status   03/26/2017   Final    Left lateral shin, punch  - Suppurative inflammation with sinus tract formation, debris, and reactive changes. See comment.          Pertinent PMH:   Ulcerative colitis, in remission  HTN  DM   Denies h/o liver, kidney abnormalities     ROS: A balance of 10 systems was reviewed and negative.     PE:  General: Well-developed, well-nourished male in no acute distress, resting comfortably.  Neuro: Alert and oriented, answers questions appropriately.  Skin: Examination of face, neck, bilateral lower extremities, , , bilateral arms and hands was notable for:  - Left lateral lower leg with large ulcer exposing muscle - wound unchannged, slightly less drainage    -All other areas examined were normal or had no significant findings.

## 2018-02-08 NOTE — Progress Notes (Signed)
[name], [name] ([id number]) Visit Report for 02/06/2018 Arrival Information Details Patient Name: [name] Date of Service: 02/06/2018 8:00 AM Medical Record Number: [id number] Patient Account Number: [id number] Date of Birth/Sex: August 11, 1980 (37 y.o. M) Treating RN: Montey Hora Primary Care Zarie Kosiba: [name] Other Clinician: Referring Kennan Detter: [name] Treating Desree Leap/Extender: Melburn Hake, [name]: 75 Visit Information History Since Last Visit Added or deleted any medications: No Patient Arrived: Ambulatory Any new allergies or adverse reactions: No Arrival Time: 08:23 Had a fall or experienced change in No Accompanied By: self activities of daily living that may affect Transfer Assistance: None risk of falls: Patient Identification Verified: Yes Signs or symptoms of abuse/neglect since last visito No Secondary Verification Process Completed: Yes Hospitalized since last visit: No Patient Requires Transmission-Based No Implantable device outside of the clinic excluding No Precautions: cellular tissue based products placed in the center Patient Has Alerts: No since last visit: Pain Present Now: No Electronic Signature(s) Signed: 02/06/2018 11:50:51 AM By: Lorine Bears RCP, RRT, CHT Entered By: Lorine Bears on 02/06/2018 08:24:18 [name] ([id number]) -------------------------------------------------------------------------------- Pain Assessment Details Patient Name: [name] Date of Service: 02/06/2018 8:00 AM Medical Record Number: [id number] Patient Account Number: [id number] Date of Birth/Sex: [birth year] (37 y.o. M) Treating RN: Montey Hora Primary Care Shali Vesey: [name] Other Clinician: Referring Glanda Spanbauer: [name] Treating Kiyanna Biegler/Extender: Melburn Hake, [name]: 51 Active Problems Location of Pain Severity and Description of Pain Patient Has Paino No Site  Locations Pain Management and Medication Current Pain Management: Electronic Signature(s) Signed: 02/06/2018 11:50:51 AM By: Lorine Bears RCP, RRT, CHT Signed: 02/06/2018 5:18:43 PM By: Montey Hora Entered By: Lorine Bears on 02/06/2018 08:24:26 [name] ([id number]) -------------------------------------------------------------------------------- Vitals Details Patient Name: [name] Date of Service: 02/06/2018 8:00 AM Medical Record Number: [id number] Patient Account Number: [id number] Date of Birth/Sex: 09-20-1980 (37 y.o. M) Treating RN: Montey Hora Primary Care Javarious Elsayed: [name] Other Clinician: Referring Iris Tatsch: [name] Treating Hitoshi Werts/Extender: Melburn Hake, [name]: 78 Vital Signs Time Taken: 08:24 Temperature (F): 97.7 Height (in): 71 Pulse (bpm): 60 Weight (lbs): 338 Respiratory Rate (breaths/min): 16 Body Mass Index (BMI): 47.1 Blood Pressure (mmHg): 165/84 Reference Range: 80 - 120 mg / dl Electronic Signature(s) Signed: 02/06/2018 11:50:51 AM By: Lorine Bears RCP, RRT, CHT Entered By: Becky Sax, Amado Nash on 02/06/2018 08:27:02

## 2018-02-09 ENCOUNTER — Ambulatory Visit
Admission: RE | Admit: 2018-02-09 | Discharge: 2018-02-09 | Disposition: A | Discharge status: Home / Self Care | Payer: 59 | Source: Ambulatory Visit | Attending: Physician Assistant | Admitting: Physician Assistant

## 2018-02-09 DIAGNOSIS — X58XXXA Exposure to other specified factors, initial encounter: Secondary | ICD-10-CM | POA: Diagnosis not present

## 2018-02-09 DIAGNOSIS — R600 Localized edema: Secondary | ICD-10-CM | POA: Diagnosis not present

## 2018-02-09 DIAGNOSIS — T148XXA Other injury of unspecified body region, initial encounter: Secondary | ICD-10-CM | POA: Diagnosis not present

## 2018-02-09 DIAGNOSIS — S81802A Unspecified open wound, left lower leg, initial encounter: Secondary | ICD-10-CM | POA: Insufficient documentation

## 2018-02-09 DIAGNOSIS — Z79899 Other long term (current) drug therapy: Secondary | ICD-10-CM | POA: Diagnosis not present

## 2018-02-09 NOTE — Unmapped (Signed)
02/09/18    Everrett Coombe, MD    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    I recently prescribed this patient Humira. The prior authorization was denied and the patient was unable to fill their prescription. I have reviewed the patient???s diagnosis, care plan and clinical guidelines for treatment and request a formal appeal of your denial for Humira.    When treating a patient with Pyoderma Gangrenosum it is necessary to have access to the full spectrum of accepted treatments as patients may not be able to use one particular treatment due to lack of response, the potential for side effects or even an allergic reaction. It can become a serious safety issue for the patient if I am not able to prescribe a wide variety of treatments for this condition.    We have previously prescribed the following therapies  ?? Clobetasol ointment  ?? Long term prednisone use resulting in excessive weight gain  ?? Bactroban ointment  ?? Bactrim  ?? Doxycycline  ?? Santyl    I strongly believe Kijana Cromie needs access to Humira.  Pyoderma gangrenosum is a debilitating skin disease that has a profound impact on patient quality of life.  TNF-alpha inhibitors have shown to be efficacious for the use of pyoderma gangrenosum and do not expose the patient to the long term side effects of chronic prednisone use, which the patient has been on for almost one year.  Studies have shown healing rates of 60-100% using adalimumab as below.     Additionally, I request that you review the following evidence showing how this medication can be effectively utilized to treat Pyoderma Gangrenosum:      Effectiveness of systemic treatments for pyoderma gangrenosum: a systematic review of observational studies and clinical trials.  Beaulah Corin, Starleen Arms, Rosen CF, [address], [place], [place].  Br J Dermatol. 2018 Aug;179(2):290-295. doi: 10.1111/bjd.16485. Epub 2018 Jun 6. Review.      On behalf of Derelle Cockrell, I would appreciate your prompt reconsideration of this denial. Please feel free to contact me at [phone] for any additional information you may require. I look forward to receiving your response and approval of coverage for this medication.    Sincerely,      Everrett Coombe, MD

## 2018-02-09 NOTE — Unmapped (Signed)
Quant gold negative, pt notified.

## 2018-02-10 LAB — HEMOGLOBIN: Lab: 15

## 2018-02-19 MED ORDER — DAPSONE 25 MG TABLET
ORAL_TABLET | Freq: Every day | ORAL | 2 refills | 0.00000 days | Status: CP
Start: 2018-02-19 — End: 2018-03-18

## 2018-02-19 NOTE — Unmapped (Signed)
Called w/ normal g6pd result. Will start dapsone 50 mg daily w/ labs in 2 weeks. Reviewed risks of dapsone including anemia, severe drug reactions, liver toxicity, motor neuropathy

## 2018-02-19 NOTE — Unmapped (Signed)
Lab orders entered for dapsone monitoring.

## 2018-02-24 ENCOUNTER — Ambulatory Visit (INDEPENDENT_AMBULATORY_CARE_PROVIDER_SITE_OTHER): Payer: 59 | Admitting: Internal Medicine

## 2018-02-24 ENCOUNTER — Encounter: Payer: Self-pay | Admitting: Internal Medicine

## 2018-02-24 DIAGNOSIS — I878 Other specified disorders of veins: Secondary | ICD-10-CM | POA: Diagnosis not present

## 2018-02-24 NOTE — Progress Notes (Addendum)
[facility]  Reason for Consult: Pyoderma gangrenosum  Referring Provider: [name], NP  Assessment: It is hard to know how much of a role infection is playing and preventing the ulcer from healing.  I am not at all surprised that he is colonized with MRSA. I am not absolutely convinced that it is causing active infection.  I discussed treatment options with him including observing off of antibiotics for now and seeing how he does with the addition of dapsone, going back on oral antibiotics or starting IV vancomycin.  We decided to observe off of antibiotics for now and see how he does over the next few weeks on dapsone.  Plan: 1. Continue observation off of antibiotics for now 2. Follow-up in 3 weeks  Patient Active Problem List   Diagnosis Date Noted  . 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 Venous stasis 02/24/2018  . Chronic pain of left knee 12/05/2017  . Chronic pain of left ankle 12/05/2017  . Type 2 diabetes mellitus (HCC) 07/15/2017  . Lower extremity edema 02/20/2017  . Seasonal allergic rhinitis 11/25/2016  . Depression 04/17/2015  . Hyperlipidemia 04/17/2015    Patient's Medications  New Prescriptions   No medications on file  Previous Medications   ACETAMINOPHEN (TYLENOL) 500 MG TABLET    Take 1,000 mg by mouth every 6 (six) hours as needed for headache (pain).   CETIRIZINE (ZYRTEC) 10 MG TABLET    Take 1 tablet (10 mg total) by mouth daily.   CLOBETASOL OINTMENT (TEMOVATE) 0.05 %    Apply to wound once daily   DAPSONE 100 MG TABLET    Take 100 mg by mouth daily.   IBUPROFEN (ADVIL,MOTRIN) 200 MG TABLET    Take 400-600 mg by mouth every 6 (six) hours as needed for headache (pain).   METFORMIN (GLUCOPHAGE) 1000 MG TABLET    Take 1 tablet (1,000 mg total) by mouth 2 (two) times daily with a meal.   PREDNISONE (DELTASONE) 10 MG TABLET    Start 20 mg daily for 2 weeks then decrease to 10 mg daily for 2 weeks  then stop.   RANITIDINE (ZANTAC) 150 MG TABLET    Take 150 mg by mouth 2 (two) times daily as needed for heartburn (acid reflux).    VENLAFAXINE XR (EFFEXOR-XR) 150 MG 24 HR CAPSULE    TAKE 1 CAPSULE BY MOUTH  DAILY WITH BREAKFAST  Modified Medications   No medications on file  Discontinued Medications   No medications on file    HPI: [name] is a 37 y.o. male who developed a left lower leg ulcer about 1 year ago.  He was diagnosed with pyoderma gangrenosum and started on oral prednisone last November.  He is gradually tapered from 60 mg daily down to his current dose of 20 mg daily.  He states that the wound had progressively healed and was only about the size of little finger in mid August.  He then started to have increased drainage and was diagnosed with superimposed MRSA infection.  He was started on doxycycline but did not notice much improvement.  In fact, his wound has enlarged since that time.  He has been off of doxycycline for about 3 weeks.  He has not noted any worsening.  His dermatologist, Dr. Carles Collet started him on dapsone 3 days ago.  Review of Systems: Review of Systems  Constitutional: Negative for chills, diaphoresis and fever.  Gastrointestinal: Negative for abdominal pain, diarrhea, nausea and vomiting.  Skin: Positive for itching and rash.       Left leg ulcer as noted in HPI.      Past Medical History:  Diagnosis Date  . Blood in stool   . Depression   . Elevated blood pressure   . Hyperlipidemia   . OSA (obstructive sleep apnea) 2014  . Seasonal allergies     Social History   Tobacco Use  . Smoking status: Former Games developer  . Smokeless tobacco: Former Neurosurgeon    Quit date: 03/17/2015  . Tobacco comment: Quit in [place] smoker for 9 years  Substance Use Topics  . Alcohol use: Yes    Alcohol/week: 0.0 standard drinks    Comment: soical  . Drug use: Not on file    Family History  Problem Relation Age of Onset  . Alcohol abuse Paternal Aunt    . Alcohol abuse Paternal Uncle   . Stroke Paternal Uncle   . Hyperlipidemia Maternal Grandfather   . Hypertension Maternal Grandfather   . Diabetes Maternal Grandfather   . Alcohol abuse Maternal Grandfather   . Multiple sclerosis Mother   . Dementia Mother    Allergies  Allergen Reactions  . Biaxin [Clarithromycin] Other (See Comments)    Causes colitis flares  . Milk-Related Compounds Hives    OBJECTIVE: Vitals:   02/24/18 0843  BP: (!) 153/91  Pulse: 90  Temp: 98.5 F (36.9 C)  Weight: (!) 361 lb 12.8 oz (164.1 kg)   Body mass index is 49.07 kg/m.   Physical Exam  Constitutional: He is oriented to person, place, and time.  He is pleasant and in no distress.  Musculoskeletal: Normal range of motion. He exhibits no edema or tenderness.  Neurological: He is alert and oriented to person, place, and time.  Skin:  He has changes of venous stasis dermatitis on his left leg.  He has a large ulcer on the lateral aspect of the his lower leg.  Cellulitis or fluctuance.  Amount of yellow exudate on the ulcer base.  There is no odor.  Psychiatric: He has a normal mood and affect.      Microbiology: No results found for this or any previous visit (from the past 240 hour(s)).  Cliffton Asters, MD [facility] [phone] pager   [phone] cell 02/24/2018, 9:08 AM

## 2018-02-27 ENCOUNTER — Ambulatory Visit: Payer: 59 | Admitting: Primary Care

## 2018-03-05 ENCOUNTER — Encounter: Payer: Self-pay | Admitting: Family Medicine

## 2018-03-05 ENCOUNTER — Ambulatory Visit (INDEPENDENT_AMBULATORY_CARE_PROVIDER_SITE_OTHER): Payer: 59 | Admitting: Family Medicine

## 2018-03-05 VITALS — BP 136/82 | HR 91 | Temp 98.2°F | Ht 72.0 in | Wt 361.2 lb

## 2018-03-05 DIAGNOSIS — E11622 Type 2 diabetes mellitus with other skin ulcer: Secondary | ICD-10-CM | POA: Diagnosis not present

## 2018-03-05 DIAGNOSIS — J0140 Acute pansinusitis, unspecified: Secondary | ICD-10-CM | POA: Insufficient documentation

## 2018-03-05 DIAGNOSIS — J019 Acute sinusitis, unspecified: Secondary | ICD-10-CM

## 2018-03-05 DIAGNOSIS — K5190 Ulcerative colitis, unspecified, without complications: Secondary | ICD-10-CM

## 2018-03-05 DIAGNOSIS — H6501 Acute serous otitis media, right ear: Secondary | ICD-10-CM | POA: Diagnosis not present

## 2018-03-05 DIAGNOSIS — L88 Pyoderma gangrenosum: Secondary | ICD-10-CM | POA: Diagnosis not present

## 2018-03-05 HISTORY — DX: Ulcerative colitis, unspecified, without complications: K51.90

## 2018-03-05 MED ORDER — AMOXICILLIN-POT CLAVULANATE 875-125 MG PO TABS: 1.0000 | ORAL_TABLET | Freq: Two times a day (BID) | ORAL | 0 refills | Status: AC

## 2018-03-05 NOTE — Assessment & Plan Note (Signed)
Currently seems to be in remission. He was prior on asacol for several years. Does not have GI doctor at this time.

## 2018-03-05 NOTE — Assessment & Plan Note (Signed)
Followed by wound care on daily dapsone, tapering off prednisone.

## 2018-03-05 NOTE — Assessment & Plan Note (Addendum)
With serous otitis of R ear Treat aggressively given comorbidities.  Placed on augmentin 10d course. rec start flonase  Update if not improving with treatment.

## 2018-03-05 NOTE — Progress Notes (Signed)
BP 136/82 (BP Location: Left Arm, Patient Position: Sitting, Cuff Size: Large)   Pulse 91   Temp 98.2 F (36.8 C) (Oral)   Ht 6' (1.829 m)   Wt (!) 361 lb 4 oz (163.9 kg)   SpO2 93%   BMI 48.99 kg/m    CC: sinus congestion Subjective:    Patient ID: [name], male    DOB: 03-31-1981, 37 y.o.   MRN: [id number]  HPI: Nakeem Murnane is a 37 y.o. male presenting on 03/05/2018 for Sinus Problem (C/o runny nose, nasal drainage, sinus pressure.  Also, c/o muffling in right ear and productive cough. Sxs started 02/27/18. Tried Sudefed.  )   1 wk h/o productive cough, drainage, R muffled hearing, sinus pressure. PNdrainage. Frontal sinus pressure.   Taking pseudophed with benefit as well as vicks nasal spray.  No fevers/chills, ST, tooth or ear pain, or wheezing.   No h/o asthma. Child sick at home.  No recent sinus infection. Recent abx for MRSA infection in h/o pyoderma gangrenosum. On prednisone course as well as dapsone daily (started 2 wks ago), undergoing prednisone taper followed by wound care. Endorses h/o ulcerative colitis after biaxin.   Relevant past medical, surgical, family and social history reviewed and updated as indicated. Interim medical history since our last visit reviewed. Allergies and medications reviewed and updated. Outpatient Medications Prior to Visit  Medication Sig Dispense Refill  . acetaminophen (TYLENOL) 500 MG tablet Take 1,000 mg by mouth every 6 (six) hours as needed for headache (pain).    . cetirizine (ZYRTEC) 10 MG tablet Take 1 tablet (10 mg total) by mouth daily. 90 tablet 1  . clobetasol ointment (TEMOVATE) 0.05 % Apply to wound once daily    . dapsone 100 MG tablet Take 100 mg by mouth daily.    Marland Kitchen ibuprofen (ADVIL,MOTRIN) 200 MG tablet Take 400-600 mg by mouth every 6 (six) hours as needed for headache (pain).    . metFORMIN (GLUCOPHAGE) 1000 MG tablet Take 1 tablet (1,000 mg total) by mouth 2 (two) times daily with a meal. 180 tablet 3  .  predniSONE (DELTASONE) 10 MG tablet Start 20 mg daily for 2 weeks then decrease to 10 mg daily for 2 weeks then stop.    . ranitidine (ZANTAC) 150 MG tablet Take 150 mg by mouth 2 (two) times daily as needed for heartburn (acid reflux).     . venlafaxine XR (EFFEXOR-XR) 150 MG 24 hr capsule TAKE 1 CAPSULE BY MOUTH  DAILY WITH BREAKFAST 90 capsule 0   No facility-administered medications prior to visit.      Per HPI unless specifically indicated in ROS section below Review of Systems     Objective:    BP 136/82 (BP Location: Left Arm, Patient Position: Sitting, Cuff Size: Large)   Pulse 91   Temp 98.2 F (36.8 C) (Oral)   Ht 6' (1.829 m)   Wt (!) 361 lb 4 oz (163.9 kg)   SpO2 93%   BMI 48.99 kg/m   Wt Readings from Last 3 Encounters:  03/05/18 (!) 361 lb 4 oz (163.9 kg)  02/24/18 (!) 361 lb 12.8 oz (164.1 kg)  12/05/17 (!) 355 lb 8 oz (161.3 kg)    Physical Exam  Constitutional: He appears well-developed and well-nourished. No distress.  HENT:  Head: Normocephalic and atraumatic.  Right Ear: External ear and ear canal normal.  Left Ear: Hearing, tympanic membrane, external ear and ear canal normal.  Nose: Mucosal edema and rhinorrhea present.  Right sinus exhibits frontal sinus tenderness. Right sinus exhibits no maxillary sinus tenderness. Left sinus exhibits frontal sinus tenderness. Left sinus exhibits no maxillary sinus tenderness.  Mouth/Throat: Uvula is midline, oropharynx is clear and moist and mucous membranes are normal. No oropharyngeal exudate, posterior oropharyngeal edema, posterior oropharyngeal erythema or tonsillar abscesses.  Yellow fluid behind R TM with decreased hearing Bulging TM No significant erythema of TM Preserved light reflex Purulent mucous R nasal passage  Eyes: Pupils are equal, round, and reactive to light. Conjunctivae and EOM are normal. No scleral icterus.  Neck: Normal range of motion. Neck supple.  Cardiovascular: Normal rate, regular  rhythm, normal heart sounds and intact distal pulses.  No murmur heard. Pulmonary/Chest: Effort normal. No respiratory distress. He has decreased breath sounds. He has wheezes (faint). He has no rhonchi. He has no rales.  Diffusely diminished breath sounds due to body habitus   Lymphadenopathy:    He has no cervical adenopathy.  Skin: Skin is warm and dry. No rash noted.  Nursing note and vitals reviewed.     Assessment & Plan:   Problem List Items Addressed This Visit    Ulcerative colitis (HCC)    Currently seems to be in remission. He was prior on asacol for several years. Does not have GI doctor at this time.       Type 2 diabetes mellitus (HCC)   Pyoderma gangrenosum    Followed by wound care on daily dapsone, tapering off prednisone.      Acute non-recurrent sinusitis - Primary    With serous otitis of R ear Treat aggressively given comorbidities.  Placed on augmentin 10d course. rec start flonase  Update if not improving with treatment.       Relevant Medications   amoxicillin-clavulanate (AUGMENTIN) 875-125 MG tablet    Other Visit Diagnoses    Non-recurrent acute serous otitis media of right ear       Relevant Medications   amoxicillin-clavulanate (AUGMENTIN) 875-125 MG tablet       Meds ordered this encounter  Medications  . amoxicillin-clavulanate (AUGMENTIN) 875-125 MG tablet    Sig: Take 1 tablet by mouth 2 (two) times daily for 10 days.    Dispense:  20 tablet    Refill:  0   No orders of the defined types were placed in this encounter.   Follow up plan: Return if symptoms worsen or fail to improve.  Eustaquio Boyden, MD

## 2018-03-05 NOTE — Patient Instructions (Signed)
I think you have a sinus infection. Take medicine as prescribed: augmentin 10 day course  Push fluids and plenty of rest. Nasal saline irrigation or neti pot to help drain sinuses. Start flonase.  May use plain mucinex with plenty of fluid to help mobilize mucous. Please let us know if fever >101.5, trouble opening/closing mouth, difficulty swallowing, or worsening instead of improving as expected.

## 2018-03-09 ENCOUNTER — Encounter: Payer: 59 | Attending: Physician Assistant | Admitting: Physician Assistant

## 2018-03-09 DIAGNOSIS — K5190 Ulcerative colitis, unspecified, without complications: Secondary | ICD-10-CM | POA: Diagnosis not present

## 2018-03-09 DIAGNOSIS — E119 Type 2 diabetes mellitus without complications: Secondary | ICD-10-CM | POA: Diagnosis present

## 2018-03-09 DIAGNOSIS — L97222 Non-pressure chronic ulcer of left calf with fat layer exposed: Secondary | ICD-10-CM | POA: Diagnosis not present

## 2018-03-09 DIAGNOSIS — I10 Essential (primary) hypertension: Secondary | ICD-10-CM | POA: Diagnosis not present

## 2018-03-09 DIAGNOSIS — E669 Obesity, unspecified: Secondary | ICD-10-CM | POA: Diagnosis not present

## 2018-03-09 DIAGNOSIS — F17200 Nicotine dependence, unspecified, uncomplicated: Secondary | ICD-10-CM | POA: Diagnosis not present

## 2018-03-09 DIAGNOSIS — Z833 Family history of diabetes mellitus: Secondary | ICD-10-CM | POA: Insufficient documentation

## 2018-03-09 DIAGNOSIS — Z8249 Family history of ischemic heart disease and other diseases of the circulatory system: Secondary | ICD-10-CM | POA: Insufficient documentation

## 2018-03-09 DIAGNOSIS — Z91011 Allergy to milk products: Secondary | ICD-10-CM | POA: Diagnosis not present

## 2018-03-09 DIAGNOSIS — E11622 Type 2 diabetes mellitus with other skin ulcer: Secondary | ICD-10-CM | POA: Diagnosis not present

## 2018-03-09 DIAGNOSIS — Z823 Family history of stroke: Secondary | ICD-10-CM | POA: Insufficient documentation

## 2018-03-09 DIAGNOSIS — L97213 Non-pressure chronic ulcer of right calf with necrosis of muscle: Secondary | ICD-10-CM | POA: Diagnosis not present

## 2018-03-09 DIAGNOSIS — L88 Pyoderma gangrenosum: Secondary | ICD-10-CM | POA: Diagnosis not present

## 2018-03-09 DIAGNOSIS — L280 Lichen simplex chronicus: Secondary | ICD-10-CM | POA: Insufficient documentation

## 2018-03-09 DIAGNOSIS — I872 Venous insufficiency (chronic) (peripheral): Secondary | ICD-10-CM | POA: Diagnosis not present

## 2018-03-10 ENCOUNTER — Ambulatory Visit: Payer: 59 | Admitting: Primary Care

## 2018-03-14 NOTE — Progress Notes (Signed)
JENIEL, SLAUSON ([id number]) Visit Report for 03/09/2018 Arrival Information Details Patient Name: [name], [name] Date of Service: 03/09/2018 8:30 AM Medical Record Number: [id number] Patient Account Number: [id number] Date of Birth/Sex: Apr 19, 1981 (37 y.o. M) Treating RN: [name] Primary Care Adalynd Donahoe: [name] Other Clinician: Referring Toryn Dewalt: [name] Treating Jamayia Croker/Extender: Melburn Hake, [name]: 22 Visit Information History Since Last Visit Added or deleted any medications: Yes Patient Arrived: Ambulatory Any new allergies or adverse reactions: No Arrival Time: 08:26 Had a fall or experienced change in No Accompanied By: self activities of daily living that may affect Transfer Assistance: None risk of falls: Patient Identification Verified: Yes Signs or symptoms of abuse/neglect since last visito No Secondary Verification Process Completed: Yes Hospitalized since last visit: No Patient Requires Transmission-Based No Implantable device outside of the clinic excluding No Precautions: cellular tissue based products placed in the center Patient Has Alerts: No since last visit: Has Dressing in Place as Prescribed: Yes Pain Present Now: No Electronic Signature(s) Signed: 03/09/2018 4:57:35 PM By: Lorine Bears RCP, RRT, CHT Entered By: Lorine Bears on 03/09/2018 08:27:36 Codner, [name] ([id number]) -------------------------------------------------------------------------------- Complex / Palliative Patient Assessment Details Patient Name: [name] Date of Service: 03/09/2018 8:30 AM Medical Record Number: [id number] Patient Account Number: [id number] Date of Birth/Sex: 08-02-1980 (37 y.o. M) Treating RN: [name] Primary Care Foye Haggart: [name] Other Clinician: Referring Savayah Waltrip: [name] Treating Burle Kwan/Extender: Melburn Hake, [name]: 61 Palliative Management  Criteria Complex Wound Management Criteria Patient has remarkable or complex co-morbidities requiring medications or treatments that extend wound healing times. Examples: o Diabetes mellitus with chronic renal failure or end stage renal disease requiring dialysis o Advanced or poorly controlled rheumatoid arthritis o Diabetes mellitus and end stage chronic obstructive pulmonary disease o Active cancer with current chemo- or radiation therapy DM; pyoderna gangrenosum Care Approach Wound Care Plan: Complex Wound Management Electronic Signature(s) Signed: 03/09/2018 6:05:50 PM By: [name], [name], RN, [name], [name], [name] Signed: 03/13/2018 8:35:57 AM By: Worthy Keeler PA-C Entered By: [name], [name], RN, [name], [name] on 03/09/2018 08:53:26 [name] ([id number]) -------------------------------------------------------------------------------- Encounter Discharge Information Details Patient Name: [name] Date of Service: 03/09/2018 8:30 AM Medical Record Number: [id number] Patient Account Number: [id number] Date of Birth/Sex: 14-Jul-1980 (37 y.o. M) Treating RN: [name] Primary Care Katherinne Mofield: [name] Other Clinician: Referring Dacota Ruben: [name] Treating Allycia Pitz/Extender: Melburn Hake, [name]: 52 Encounter Discharge Information Items Discharge Condition: Stable Ambulatory Status: Ambulatory Discharge Destination: Home Transportation: Private Auto Accompanied By: self Schedule Follow-up Appointment: Yes Clinical Summary of Care: Post Procedure Vitals: Temperature (F): 98.3 Pulse (bpm): 86 Respiratory Rate (breaths/min): 16 Blood Pressure (mmHg): 144/78 Electronic Signature(s) Signed: 03/09/2018 6:05:50 PM By: [name], [name], RN, [name], [name], [name] Entered By: [name], [name], RN, [name], [name] on 03/09/2018 09:03:38 [name] ([id number]) -------------------------------------------------------------------------------- Lower Extremity Assessment  Details Patient Name: [name] Date of Service: 03/09/2018 8:30 AM Medical Record Number: [id number] Patient Account Number: [id number] Date of Birth/Sex: 07-22-1980 (37 y.o. M) Treating RN: [name] Primary Care Alden Bensinger: [name] Other Clinician: Referring Jarelis Ehlert: [name] Treating Reiley Bertagnolli/Extender: Melburn Hake, [name]: 65 Edema Assessment Assessed: [Left: Yes] [Right: No] Edema: [Left: Ye] [Right: s] Calf Left: Right: Point of Measurement: 30 cm From Medial Instep 50.5 cm cm Ankle Left: Right: Point of Measurement: 12 cm From Medial Instep 30.5 cm cm Vascular Assessment Pulses: Dorsalis Pedis Palpable: [Left:Yes] Posterior Tibial Extremity colors, hair growth, and conditions: Extremity Color: [Left:Red] Hair  Growth on Extremity: [Left:Yes] Temperature of Extremity: [Left:Warm] Capillary Refill: [Left:< 3 seconds] Toe Nail Assessment Left: Right: Thick: Yes Discolored: No Deformed: No Improper Length and Hygiene: No Electronic Signature(s) Signed: 03/09/2018 6:05:50 PM By: [name], [name], RN, [name], [name], [name] Entered By: [name], [name], RN, [name], [name] on 03/09/2018 08:43:52 [name] ([id number]) -------------------------------------------------------------------------------- Multi Wound Chart Details Patient Name: [name] Date of Service: 03/09/2018 8:30 AM Medical Record Number: [id number] Patient Account Number: [id number] Date of Birth/Sex: 1980-09-17 (37 y.o. 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] [N/A:N/A] Comorbid History: [1:Sleep Apnea, Hypertension, Colitis] [N/A:N/A] Date Acquired: [1:11/18/2015] [N/A:N/A] Weeks of Treatment: [1:65] [N/A:N/A] Wound Status: [1:Open] [N/A:N/A] Measurements L x W x D [1:3.8x4.5x0.6] [N/A:N/A] (cm) Area (cm) : [1:13.43] [N/A:N/A] Volume (cm) : [1:8.058] [N/A:N/A] % Reduction in Area: [1:-173.60%] [N/A:N/A] % Reduction in Volume: [1:-105.20%] [N/A:N/A] Classification: [1:Full Thickness Without Exposed Support Structures] [N/A:N/A] Exudate Amount: [1:Large] [N/A:N/A] Exudate Type: [1:Serous] [N/A:N/A] Exudate Color: [1:amber] [N/A:N/A] Wound Margin: [1:Distinct, outline attached] [N/A:N/A] Granulation Amount: [1:Small (1-33%)] [N/A:N/A] Granulation Quality: [1:Red, Pink] [N/A:N/A] Necrotic Amount: [1:Large (67-100%)] [N/A:N/A] Exposed Structures: [1:Fat Layer (Subcutaneous Tissue) Exposed: Yes Fascia: No Tendon: No Muscle: No Joint: No Bone: No] [N/A:N/A] Epithelialization: [1:Small (1-33%)] [N/A:N/A] Periwound Skin Texture: [1:Induration: Yes Excoriation: No Callus: No Crepitus: No] [N/A:N/A] Rash: No Scarring: No Periwound Skin Moisture: Maceration: No N/A N/A Dry/Scaly: No Periwound Skin Color: Erythema: Yes N/A N/A Atrophie Blanche: No Cyanosis: No Ecchymosis: No Hemosiderin Staining: No Mottled: No Pallor: No Rubor: No Erythema Location: Circumferential N/A N/A Temperature: No Abnormality N/A N/A Tenderness on Palpation: Yes N/A N/A Wound Preparation: Ulcer Cleansing: N/A N/A Rinsed/Irrigated with Saline Topical Anesthetic Applied: Other: lidocaine 4% Treatment Notes Electronic Signature(s) Signed: 03/09/2018 6:05:50 PM By: [name], [name], RN, [name], [name], [name] Entered By: [name], [name], RN, [name], [name] on 03/09/2018 08:44:13 YORDY, MATTON ([id number]) -------------------------------------------------------------------------------- Multi-Disciplinary Care Plan  Details Patient Name: [name] Date of Service: 03/09/2018 8:30 AM Medical Record Number: [id number] Patient Account Number: [id number] Date of Birth/Sex: January 10, 1981 (37 y.o. M) Treating RN: [name] Primary Care Akeema Broder: [name] Other Clinician: Referring Murlean Seelye: [name] Treating Dontavis Tschantz/Extender: Melburn Hake, [name]: 25 Active Inactive ` Orientation to the Wound Care Program Nursing Diagnoses: Knowledge deficit related to the wound healing center program Goals: Patient/caregiver will verbalize understanding of the [place] Program Date Initiated: 12/11/2016 Target Resolution Date: 03/13/2017 Goal Status: Active Interventions: Provide education on orientation to the wound center Notes: ` Venous Leg Ulcer Nursing Diagnoses: Knowledge deficit related to disease process and management Potential for venous Insuffiency (use before diagnosis confirmed) Goals: Non-invasive venous studies are completed as ordered Date Initiated: 12/11/2016 Target Resolution Date: 03/13/2017 Goal Status: Active Patient will maintain optimal edema control Date Initiated: 12/11/2016 Target Resolution Date: 03/13/2017 Goal Status: Active Patient/caregiver will verbalize understanding of disease process and disease management Date Initiated: 12/11/2016 Target Resolution Date: 03/13/2017 Goal Status: Active Verify adequate tissue perfusion prior to therapeutic compression application Date Initiated: 12/11/2016 Target Resolution Date: 03/13/2017 Goal Status: Active Interventions: Assess peripheral edema status every visit. Compression as ordered Provide education on venous insufficiency Treatment Activities: TAEVON, ASCHOFF ([id number]) Therapeutic compression applied :  12/11/2016 Notes: ` Wound/Skin Impairment Nursing Diagnoses: Impaired tissue integrity Knowledge deficit related to ulceration/compromised skin integrity Goals: Patient/caregiver  will verbalize understanding of skin care regimen Date Initiated: 12/11/2016 Target Resolution Date: 03/13/2017 Goal Status: Active Ulcer/skin breakdown will have a volume reduction of 30% by week 4 Date Initiated: 12/11/2016 Target Resolution Date: 03/13/2017 Goal Status: Active Ulcer/skin breakdown will have a volume reduction of 50% by week 8 Date Initiated: 12/11/2016 Target Resolution Date: 03/13/2017 Goal Status: Active Ulcer/skin breakdown will have a volume reduction of 80% by week 12 Date Initiated: 12/11/2016 Target Resolution Date: 03/13/2017 Goal Status: Active Ulcer/skin breakdown will heal within 14 weeks Date Initiated: 12/11/2016 Target Resolution Date: 03/13/2017 Goal Status: Active Interventions: Assess patient/caregiver ability to obtain necessary supplies Assess patient/caregiver ability to perform ulcer/skin care regimen upon admission and as needed Assess ulceration(s) every visit Provide education on ulcer and skin care Treatment Activities: Skin care regimen initiated : 12/11/2016 Topical wound management initiated : 12/11/2016 Notes: Electronic Signature(s) Signed: 03/09/2018 6:05:50 PM By: [name], [name], RN, [name], [name], [name] Entered By: [name], [name], RN, [name], [name] on 03/09/2018 08:43:58 [name] ([id number]) -------------------------------------------------------------------------------- Pain Assessment Details Patient Name: [name] Date of Service: 03/09/2018 8:30 AM Medical Record Number: [id number] Patient Account Number: [id number] Date of Birth/Sex: 12/24/1980 (37 y.o. M) Treating RN: [name] Primary Care Birtha Hatler: [name] Other Clinician: Referring Hydia Copelin: [name] Treating Celestina Gironda/Extender: Melburn Hake, [name]: 71 Active Problems Location of Pain Severity and Description of Pain Patient Has Paino No Site Locations Pain Management and Medication Current Pain Management: Electronic Signature(s) Signed:  03/09/2018 4:57:35 PM By: Lorine Bears RCP, RRT, CHT Signed: 03/09/2018 6:05:50 PM By: [name], [name], RN, [name], [name], [name] Entered By: Lorine Bears on 03/09/2018 08:29:48 [name] ([id number]) -------------------------------------------------------------------------------- Patient/Caregiver Education Details Patient Name: [name] Date of Service: 03/09/2018 8:30 AM Medical Record Number: [id number] Patient Account Number: [id number] Date of Birth/Gender: February 15, 1981 (37 y.o. M) Treating RN: [name] Primary Care Physician: [name] Other Clinician: Referring Physician: [name] Treating Physician/Extender: Sharalyn Ink in Treatment: 24 Education Assessment Education Provided To: Patient Education Topics Provided Wound/Skin Impairment: Handouts: Caring for Your Ulcer Methods: Explain/Verbal Responses: State content correctly Electronic Signature(s) Signed: 03/09/2018 6:05:50 PM By: [name], [name], RN, [name], [name], [name] Entered By: [name], [name], RN, [name], [name] on 03/09/2018 09:02:24 [name] ([id number]) -------------------------------------------------------------------------------- Wound Assessment Details Patient Name: [name] Date of Service: 03/09/2018 8:30 AM Medical Record Number: [id number] Patient Account Number: [id number] Date of Birth/Sex: 30-Jun-1980 (37 y.o. M) Treating RN: [name] Primary Care Jolita Haefner: [name] Other Clinician: Referring Fitzhugh Vizcarrondo: [name] Treating Boby Eyer/Extender: Melburn Hake, [name]: 65 Wound Status Wound Number: 1 Primary Etiology: Pyoderma Wound Location: Left Lower Leg - Lateral Wound Status: Open Wounding Event: Gradually Appeared Comorbid History: Sleep Apnea, Hypertension, Colitis Date Acquired: 11/18/2015 Weeks Of Treatment: 65 Clustered Wound: No Wound Measurements Length: (cm) 3.8 Width: (cm) 4.5 Depth: (cm) 0.6 Area: (cm)  13.43 Volume: (cm) 8.058 % Reduction in Area: -173.6% % Reduction in Volume: -105.2% Epithelialization: Small (1-33%) Tunneling: No Undermining: No Wound Description Full Thickness Without Exposed Support Foul O Classification: Structures Slough Wound Margin: Distinct, outline attached Exudate Large Amount: Exudate Type: Serous Exudate Color: amber dor After Cleansing: No /Fibrino Yes Wound Bed Granulation Amount: Small (1-33%) Exposed Structure Granulation Quality: Red, Pink Fascia Exposed: No Necrotic Amount: Large (67-100%) Fat Layer (Subcutaneous Tissue) Exposed: Yes Necrotic Quality: Adherent Slough Tendon Exposed: No Muscle  Exposed: No Joint Exposed: No Bone Exposed: No Periwound Skin Texture Texture Color No Abnormalities Noted: No No Abnormalities Noted: No Callus: No Atrophie Blanche: No Crepitus: No Cyanosis: No Excoriation: No Ecchymosis: No Induration: Yes Erythema: Yes Rash: No Erythema Location: Circumferential Scarring: No Hemosiderin Staining: No Mottled: No Moisture Pallor: No No Abnormalities Noted: No Rubor: No Dry / Scaly: No [place], Masiyah ([id number]) Maceration: No Temperature / Pain Temperature: No Abnormality Tenderness on Palpation: Yes Wound Preparation Ulcer Cleansing: Rinsed/Irrigated with Saline Topical Anesthetic Applied: Other: lidocaine 4%, Treatment Notes Wound #1 (Left, Lateral Lower Leg) 1. Cleansed with: Clean wound with Normal Saline 2. Anesthetic Topical Lidocaine 4% cream to wound bed prior to debridement 4. Dressing Applied: Santyl Ointment Notes X sorb and tape Electronic Signature(s) Signed: 03/09/2018 6:05:50 PM By: [name], [name], RN, [name], [name], [name] Entered By: [name], [name], RN, [name], [name] on 03/09/2018 08:40:47 [name] ([id number]) -------------------------------------------------------------------------------- [place] Details Patient Name: [name] Date of Service: 03/09/2018 8:30  AM Medical Record Number: [id number] Patient Account Number: [id number] Date of Birth/Sex: 1980/11/09 (37 y.o. M) Treating RN: [name] Primary Care Sherica Paternostro: [name] Other Clinician: Referring Eustacia Urbanek: [name] Treating Sanaia Jasso/Extender: Melburn Hake, [name]: 74 Vital Signs Time Taken: 08:28 Temperature (F): 98.3 Height (in): 71 Pulse (bpm): 86 Weight (lbs): 338 Respiratory Rate (breaths/min): 16 Body Mass Index (BMI): 47.1 Blood Pressure (mmHg): 144/73 Reference Range: 80 - 120 mg / dl Electronic Signature(s) Signed: 03/09/2018 4:57:35 PM By: Lorine Bears RCP, RRT, CHT Entered By: Lorine Bears on 03/09/2018 08:32:14

## 2018-03-14 NOTE — Progress Notes (Signed)
MIVAAN, CORBITT ([id number]) Visit Report for 03/09/2018 Chief Complaint Document Details Patient Name: [name], [name] Date of Service: 03/09/2018 8:30 AM Medical Record Number: [id number] Patient Account Number: [id number] Date of Birth/Sex: [birth year] (37 y.o. M) Treating RN: [name] Primary Care Provider: [name] Other Clinician: Referring Provider: [name] Treating Provider/Extender: Melburn Hake, Terriah Reggio Weeks in Treatment: 72 Information Obtained from: Patient Chief Complaint He is here in follow up evaluation for LLE pyoderma ulcer Electronic Signature(s) Signed: 03/13/2018 8:35:57 AM By: Worthy Keeler PA-C Entered By: Worthy Keeler on 03/09/2018 08:44:22 [name] ([id number]) -------------------------------------------------------------------------------- Debridement Details Patient Name: [name] Date of Service: 03/09/2018 8:30 AM Medical Record Number: [id number] Patient Account Number: [id number] Date of Birth/Sex: 12/28/1980 (37 y.o. M) Treating RN: [name] Primary Care Provider: [name] Other Clinician: Referring Provider: [name] Treating Provider/Extender: Melburn Hake, Jasani Lengel Weeks in Treatment: 50 Debridement Performed for Wound #1 Left,Lateral Lower Leg Assessment: Performed By: Physician STONE III, Ronnie Mallette E., PA-C Debridement Type: Chemical/Enzymatic/Mechanical Agent Used: Santyl Level of Consciousness (Pre- Awake and Alert procedure): Pre-procedure Verification/Time Yes - 08:50 Out Taken: Start Time: 08:51 Pain Control: Lidocaine Instrument: Other : tongue blade Bleeding: None Response to Treatment: Procedure was tolerated well Level of Consciousness Awake and Alert (Post-procedure): Post Debridement Measurements of Total Wound Length: (cm) 3.8 Width: (cm) 4.5 Depth: (cm) 0.6 Volume: (cm) 8.058 Character of Wound/Ulcer Post Debridement: Stable Post Procedure Diagnosis Same as Pre-procedure Electronic  Signature(s) Signed: 03/09/2018 6:05:50 PM By: [name], [name], RN, [name], [name], [name] Signed: 03/13/2018 8:35:57 AM By: Worthy Keeler PA-C Entered By: [name], [name], RN, [name], [name] on 03/09/2018 09:01:57 [name] ([id number]) -------------------------------------------------------------------------------- HPI Details Patient Name: [name] Date of Service: 03/09/2018 8:30 AM Medical Record Number: [id number] Patient Account Number: [id number] Date of Birth/Sex: [birth year] (37 y.o. M) Treating RN: [name] Primary Care Provider: [name] Other Clinician: Referring Provider: [name] Treating Provider/Extender: Melburn Hake, Zeidy Tayag Weeks in Treatment: 71 History of Present Illness HPI Description: 12/04/16; 37 year old man who comes into the clinic today for review of a wound on the posterior left calf. He tells me that is been there for about a year. He is not a diabetic he does smoke half a pack per day. He was seen in the ER on 11/20/16 felt to have cellulitis around the wound and was given clindamycin. An x-ray did not show osteomyelitis. The patient initially tells me that he has a milk allergy that sets off a pruritic itching rash on his lower legs which she scratches incessantly and he thinks that's what may have set up the wound. He has been using various topical antibiotics and ointments without any effect. He works in a [employer] and is on his feet all day. He does not have a prior history of wounds however he does have the rash on both lower legs the right arm and the ventral aspect of his left arm. These are excoriations and clearly have had scratching however there are of macular looking areas on both legs including a substantial larger area on the right leg. This does not have an underlying open area. There is no blistering. The patient tells me that 2 years ago in [place] in response to the rash on his legs he saw a dermatologist who told him he had a condition which  may be pyoderma gangrenosum although I may be putting words into his mouth. He seemed to recognize this. On further questioning he admits to a 5 year history of  quiesced. ulcerative colitis. He is not in any treatment for this. He's had no recent travel 12/11/16; the patient arrives today with his wound and roughly the same condition we've been using silver alginate this is a deep punched out wound with some surrounding erythema but no tenderness. Biopsy I did did not show confirmed pyoderma gangrenosum suggested nonspecific inflammation and vasculitis but does not provide an actual description of what was seen by the pathologist. I'm really not able to understand this We have also received information from the patient's dermatologist in [place] notes from April 2016. This was a doctor Agarwal- antal. 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 He was prescribed topical steroid high potency under occlusion which helped but at this point the patient did not have a deep punched out wound. 12/18/16; the patient's wound is larger in terms of surface area however this surface looks better and there is less depth. The surrounding erythema also is better. The patient states that the wrap we put on came off 2 days ago when he has been using his compression stockings. He we are in the process of getting a dermatology consult. 12/26/16 on evaluation today patient's left lower extremity wound shows evidence of infection with surrounding erythema noted. He has been tolerating the dressing changes but states that he has noted more discomfort. There is a larger area of erythema surrounding the wound. No fevers, chills, nausea, or vomiting noted at this time. With that being said the wound still does have slough covering the surface. He is not allergic to any medication that he is aware of at this point. In regard to his right lower extremity he had several regions that are erythematous and pruritic he  wonders if there's anything we can do to help that. 01/02/17 I reviewed patient's wound culture which was obtained his visit last week. He was placed on doxycycline at that point. Unfortunately that does not appear to be an antibiotic that would likely help with the situation however the pseudomonas noted on culture is sensitive to Cipro. Also unfortunately patient's wound seems to have a large compared to last week's evaluation. Not severely so but there are definitely increased measurements in general. He is continuing to have discomfort as well he writes this to be a seven out of 10. In fact he would prefer me not to perform any debridement today due to the fact that he is having discomfort and considering he has an active infection on the little reluctant to do so anyway. No fevers, chills, nausea, or vomiting noted at this time. 01/08/17; patient seems dermatology on September 5. I suspect dermatology will want the slides from the biopsy I did sent to their pathologist. I'm not sure if there is a way we can expedite that. In any case the culture I did before I left on vacation 3 weeks ago showed Pseudomonas he was given 10 days of Cipro and per her description of her intake nurses is actually somewhat better this week although the wound is quite a bit bigger than I remember the last time I saw this. He still has 3 more days of Cipro LEILAND, MIHELICH ([id number]) 01/21/17; dermatology appointment tomorrow. He has completed the ciprofloxacin for Pseudomonas. Surface of the wound looks better however he is had some deterioration in the lesions on his right leg. Meantime the left lateral leg wound we will continue with sample 01/29/17; patient had his dermatology appointment but I can't yet see that note. He is completed  his antibiotics. The wound is more superficial but considerably larger in circumferential area than when he came in. This is in his left lateral calf. He also has swollen erythematous  areas with superficial wounds on the right leg and small papular areas on both arms. There apparently areas in her his upper thighs and buttocks I did not look at those. Dermatology biopsied the right leg. Hopefully will have their input next week. 02/05/17; patient went back to see his dermatologist who told him that he had a "scratching problem" as well as staph. He is now on a 30 day course of doxycycline and I believe she gave him triamcinolone cream to the right leg areas to help with the itching [not exactly sure but probably triamcinolone]. She apparently looked at the left lateral leg wound although this was not rebiopsied and I think felt to be ultimately part of the same pathogenesis. He is using sample border foam and changing nevus himself. He now has a new open area on the right posterior leg which was his biopsy site I don't have any of the dermatology notes 02/12/17; we put the patient in compression last week with SANTYL to the wound on the left leg and the biopsy. Edema is much better and the depth of the wound is now at level of skin. Area is still the same oBiopsy site on the right lateral leg we've also been using santyl with a border foam dressing and he is changing this himself. 02/19/17; Using silver alginate started last week to both the substantial left leg wound and the biopsy site on the right wound. He is tolerating compression well. Has a an appointment with his primary M.D. tomorrow wondering about diuretics although I'm wondering if the edema problem is actually lymphedema 02/26/17; the patient has been to see his primary doctor Dr. Jerrel Ivory at [facility] our primary care. She started him on Lasix 20 mg and this seems to have helped with the edema. However we are not making substantial change with the left lateral calf wound and inflammation. The biopsy site on the right leg also looks stable but not really all that different. 03/12/17; the patient has been to see vein  and vascular Dr. Lucky Cowboy. He has had venous reflux studies I have not reviewed these. I did get a call from his dermatology office. 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 They therefore recommended clobetasol and tetracycline i.e. 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 03/26/17; apparently the patient just had reflux studies not an appointment with Dr. dew. She arrives in clinic today having applied clobetasol for 2-3 weeks. He notes over the last 2-3 days excessive drainage having to change the dressing 3-4 times a day and also expanding erythema. 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 Lillia Carmel at [facility] dermatology locally who kindly saw him at my request. 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 My concern was  the degree of erythema medially extending from his left leg wound which was either inflammation from pyoderma or cellulitis. I put him on Augmentin however culture of the wound showed Pseudomonas which is quinolone sensitive. I really don't believe he has cellulitis however in view of everything I will continue and give him a course of Cipro. He is also on doxycycline as an immune modulator for the pyoderma. In addition to his original wound on the left lateral leg with surrounding erythema he has a wound on the right posterior calf which was an original biopsy site done by dermatology. This  was felt to represent pathergy from pyoderma gangrenosum 04/16/17; pyoderma gangrenosum. Saw Dr. Lillia Carmel yesterday. He has been using topical antibiotics to both wound areas his original wound on the left and the biopsies/pathergy area on the right. There is definitely some improvement in the inflammation around the wound on the right although the patient states he has increasing sensitivity of the wounds. He is on prednisone 60 and doxycycline 1 as prescribed by Dr. Lillia Carmel. He is covering the topical antibiotic with gauze and putting this in his own compression stocks and changing this daily. He states that Dr. Lottie Rater did a culture of the left leg wound yesterday 05/07/17; pyoderma gangrenosum. The patient saw Dr. Lillia Carmel yesterday and has a follow-up with her in one month. He is still using topical antibiotics to both wounds although he can't recall exactly what type. He is still on prednisone 60 mg. Dr. Lillia Carmel stated that the doxycycline could stop if we were in agreement. 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 The right medial leg was induced by biopsy/pathergy. The area on the right is essentially healed. 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 The right medial leg has closed and remains closed. He is still on prednisone 60. oHe tells me he missed his last dermatology appointment with Dr. Lillia Carmel but will make another appointment. He reports that her blood sugar at a recent screen in [place] was high 200's. He was 180 today. He is more cushingoid blood pressure is Carley, Avion ([id number]) up a bit. I think he is going to require still much longer prednisone perhaps another 3 months before attempting to taper. In the meantime his wound is a lot better. Smaller. He is  cleaning this off daily and applying topical antibiotics. When he was last in the clinic I thought about changing to [facility] and actually put in a couple of calls to dermatology although probably not during their business hours. 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 He continues on oral prednisone. He has been using triple antibiotic ointment. There is surface debris and we will transition to [facility] and have him return in 2 weeks. He has lost 30 pounds since his last appointment with lifestyle modification. He may benefit from topical steroid cream for treatment this can be considered at a later date. 08/22/17 on evaluation today patient appears to actually be doing rather well in regard to his left lateral lower extremity ulcer. He has actually been managed by Dr. Dellia Nims most recently. Patient is currently on oral steroids at this time. This seems to have been of benefit for  him. Nonetheless his last visit was actually with Leah on 08/06/17. Currently he is not utilizing any topical steroid creams although this could be of benefit as well. No fevers, chills, nausea, or vomiting noted at this time. 09/05/17 on evaluation today patient appears to be doing better in regard to his left lateral lower extremity ulcer. He has been tolerating the dressing changes without complication. He is using Santyl with good effect. Overall I'm very pleased with how things are standing at this point. Patient likewise is happy that this is doing better. 09/19/17 on evaluation today patient actually appears to be doing rather well in regard to his left lateral lower extremity ulcer. 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 He's not having any significant pain. 10/03/17 on evaluation today patient appears to be doing  excellent in regard to his lower extremity wound on the left secondary to Pyoderma gangrenosum. He has been tolerating the Santyl without complication and in general I feel like he's making good progress. 10/17/17 on evaluation today patient appears to be doing very well in regard to his left lateral lower surety ulcer. He has been tolerating the dressing changes without complication. There does not appear to be any evidence of infection he's alternating the Santyl and the triple antibiotic ointment every other day this seems to be doing well for him. 11/03/17 on evaluation today patient appears to be doing very well in regard to his left lateral lower extremity ulcer. He is been tolerating the dressing changes without complication which is good news. Fortunately there does not appear to be any evidence of infection which is also great news. Overall is doing excellent they are starting to taper down on the prednisone is down to 40 mg at this point it also started topical clobetasol for him. 11/17/17 on evaluation today patient appears to be doing well in regard to his left lateral lower surety ulcer. He's been tolerating the dressing changes without complication. He does note that he is having no pain, no excessive drainage or discharge, and overall he feels like things are going about how he would expect and hope they would. Overall he seems to have no evidence of infection at this time in my opinion which is good news. 12/04/17-He is seen in follow-up evaluation for right lateral lower extremity ulcer. He has been applying topical steroid cream. Today's measurement show slight increase in size. Over the next 2 weeks we will transition to every other day Santyl and steroid cream. He has been encouraged to monitor for changes and notify clinic with any concerns 12/15/17 on evaluation today patient's left lateral motion the ulcer and fortunately is doing worse again at this point. This just since last  week to this week has close to doubled in size according to the patient. I did not seeing last week's I do not have a visual to compare this to in our system was also down so we do not have all the charts and at this point. Nonetheless it does have me somewhat concerned in regard to the fact that again he was worried enough about it he has contact the dermatology that placed them back on the full strength, 50 mg a day of the prednisone that he was taken previous. He continues to alternate using clobetasol along with Santyl at this point. He is obviously somewhat frustrated. 12/22/17 on evaluation today patient appears to be doing a little worse  compared to last evaluation. Unfortunately the wound is a little deeper and slightly larger than the last week's evaluation. With that being said he has made some progress in regard to the irritation surrounding at this time unfortunately despite that progress that's been made he still has a significant issue going on here. 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 For that reason I discussed with him today the possibility of perform a wound culture to ensure there's nothing overtly infected. 01/06/18 on evaluation today patient's wound is larger and deeper than previously evaluated. With that being said it did appear that his wound was infected after my last evaluation with him. Subsequently I did end up prescribing a prescription for Bactrim DS which she has been taking and having no complication with. Fortunately there does not appear to be any evidence of Roat, Blong ([id number]) infection at this point in time as far as anything spreading, no want to touch, and overall I feel like things are showing signs of improvement. 01/13/18 on evaluation today patient appears to be even a little larger and deeper than  last time. There still muscle exposed in the base of the wound. Nonetheless he does appear to be less erythematous I do believe inflammation is calming down also believe the infection looks like it's probably resolved at this time based on what I'm seeing. No fevers, chills, nausea, or vomiting noted at this time. 01/30/18 on evaluation today patient actually appears to visually look better for the most part. Unfortunately those visually this looks better he does seem to potentially have what may be an abscess in the muscle that has been noted in the central portion of the wound. This is the first time that I have noted what appears to be fluctuance in the central portion of the muscle. With that being said I'm somewhat more concerned about the fact that this might indicate an abscess formation at this location. I do believe that an ultrasound would be appropriate. This is likely something we need to try to do as soon as possible. He has been switch to mupirocin ointment and he is no longer using the steroid ointment as prescribed by dermatology he sees them again next week he's been decreased from 60 to 40 mg of prednisone. 03/09/18 on evaluation today patient actually appears to be doing a little better compared to last time I saw him. There's not as much erythema surrounding the wound itself. He I did review his most recent infectious disease note which was dated 02/24/18. He saw Dr. Michel Bickers in [place]. 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 Patient is now off of antibiotics and they are continually observing this. There seems to be no change in the past two weeks in my pinion based on what the patient says and what I see today compared to what Dr. Megan Salon likely saw two weeks ago. No fevers, chills, nausea, or vomiting noted at this time. Electronic Signature(s) Signed: 03/13/2018 8:35:57 AM By:  Worthy Keeler PA-C Entered By: Worthy Keeler on 03/09/2018 09:01:35 WADELL, CRADDOCK ([id number]) -------------------------------------------------------------------------------- Physical Exam Details Patient Name: [name] Date of Service: 03/09/2018 8:30 AM Medical Record Number: [id number] Patient Account Number: [id number] Date of Birth/Sex: Apr 02, 1981 (37 y.o. M) Treating RN: [name]  Primary Care Provider: [name] Other Clinician: Referring Provider: [name] Treating Provider/Extender: Melburn Hake, Leopold Smyers Weeks in Treatment: 84 Constitutional Obese and well-hydrated in no acute distress. Respiratory normal breathing without difficulty. clear to auscultation bilaterally. Cardiovascular regular rate and rhythm with normal S1, S2. Psychiatric this patient is able to make decisions and demonstrates good insight into disease process. Alert and Oriented x 3. pleasant and cooperative. Notes Patient's wound bed currently is pretty much left covered of the entirety of the wound I was actually able to mechanically debride away some of the slough with saline and gauze. He tolerated this decently well today without complication post debridement the wound bed appears to be doing better although there was still a large portion especially on the anterior portion of the wound which was thickly [place] covered. I really believe that the patient may benefit from use of Santyl at this point. Electronic Signature(s) Signed: 03/13/2018 8:35:57 AM By: Worthy Keeler PA-C Entered By: Worthy Keeler on 03/09/2018 09:02:28 [name] ([id number]) -------------------------------------------------------------------------------- Physician Orders Details Patient Name: [name] Date of Service: 03/09/2018 8:30 AM Medical Record Number: [id number] Patient Account Number: [id number] Date of Birth/Sex: Oct 03, 1980 (37 y.o. M) Treating RN: [name] Primary Care Provider: [name] Other Clinician: Referring Provider: [name] Treating Provider/Extender: Melburn Hake, Hellen Shanley Weeks in Treatment: 89 Verbal / Phone Orders: No Diagnosis Coding ICD-10 Coding Code Description [phone] Non-pressure chronic ulcer of left calf with fat layer exposed L88 Pyoderma gangrenosum I87.2 Venous insufficiency (chronic) (peripheral) Wound Cleansing Wound #1 Left,Lateral Lower Leg o May Shower, gently pat wound dry prior to applying new dressing. Anesthetic (add to Medication List) Wound #1 Left,Lateral Lower Leg o Topical Lidocaine 4% cream applied to wound bed prior to debridement (In Clinic Only). Primary Wound Dressing Wound #1 Left,Lateral Lower Leg o Santyl Ointment Secondary Dressing Wound #1 Left,Lateral Lower Leg o Other - Gauze and xtrasorb Dressing Change Frequency Wound #1 Left,Lateral Lower Leg o Change dressing every day. Follow-up Appointments Wound #1 Left,Lateral Lower Leg o Return Appointment in 1 week. Edema Control Wound #1 Left,Lateral Lower Leg o Patient to wear own compression stockings o Elevate legs to the level of the heart and pump ankles as often as possible Medications-please add to medication list. Wound #1 Left,Lateral Lower Leg o Other: - Continue steroids SHRIYANS, KUENZI ([id number]) Electronic Signature(s) Signed: 03/09/2018 6:05:50 PM By: [name], [name], RN, [name], [name], [name] Signed: 03/13/2018 8:35:57 AM By: Worthy Keeler PA-C Entered By: [name], [name], RN, [name], [name] on 03/09/2018 08:55:58 [name] ([id number]) -------------------------------------------------------------------------------- Problem List Details Patient Name: [name] Date of Service: 03/09/2018 8:30 AM Medical Record Number: [id number] Patient Account Number: [id number] Date of Birth/Sex: 1980-12-08 (37 y.o. M) Treating RN: [name] Primary Care Provider: [name] Other Clinician: Referring Provider: [name] Treating  Provider/Extender: Melburn Hake, Cylah Fannin Weeks in Treatment: 14 Active Problems ICD-10 Evaluated Encounter Code Description Active Date Today Diagnosis L97.222 Non-pressure chronic ulcer of left calf with fat layer exposed 12/04/2016 No Yes L88 Pyoderma gangrenosum 03/26/2017 No Yes I87.2 Venous insufficiency (chronic) (peripheral) 12/04/2016 No Yes Inactive Problems ICD-10 Code Description Active Date Inactive Date L97.213 Non-pressure chronic ulcer of right calf with necrosis of muscle 04/02/2017 04/02/2017 Resolved Problems Electronic Signature(s) Signed: 03/13/2018 8:35:57 AM By: Worthy Keeler PA-C Entered By: Worthy Keeler on 03/09/2018 08:44:10 [name] ([id number]) -------------------------------------------------------------------------------- Progress Note Details Patient Name: [name] Date of Service: 03/09/2018 8:30 AM Medical Record Number: [id number] Patient Account Number: [id number] Date  of Birth/Sex: 1980-10-17 (37 y.o. M) Treating RN: [name] Primary Care Provider: [name] Other Clinician: Referring Provider: [name] Treating Provider/Extender: Melburn Hake, Payeton Germani Weeks in Treatment: 13 Subjective Chief Complaint Information obtained from Patient He is here in follow up evaluation for LLE pyoderma ulcer History of Present Illness (HPI) 12/04/16; 37 year old man who comes into the clinic today for review of a wound on the posterior left calf. He tells me that is been there for about a year. He is not a diabetic he does smoke half a pack per day. He was seen in the ER on 11/20/16 felt to have cellulitis around the wound and was given clindamycin. An x-ray did not show osteomyelitis. The patient initially tells me that he has a milk allergy that sets off a pruritic itching rash on his lower legs which she scratches incessantly and he thinks that's what may have set up the wound. He has been using various topical antibiotics and ointments without  any effect. He works in a [employer] and is on his feet all day. He does not have a prior history of wounds however he does have the rash on both lower legs the right arm and the ventral aspect of his left arm. These are excoriations and clearly have had scratching however there are of macular looking areas on both legs including a substantial larger area on the right leg. This does not have an underlying open area. There is no blistering. The patient tells me that 2 years ago in [place] in response to the rash on his legs he saw a dermatologist who told him he had a condition which may be pyoderma gangrenosum although I may be putting words into his mouth. He seemed to recognize this. 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 He is not in any treatment for this. He's had no recent travel 12/11/16; the patient arrives today with his wound and roughly the same condition we've been using silver alginate this is a deep punched out wound with some surrounding erythema but no tenderness. Biopsy I did did not show confirmed pyoderma gangrenosum suggested nonspecific inflammation and vasculitis but does not provide an actual description of what was seen by the pathologist. I'm really not able to understand this We have also received information from the patient's dermatologist in [place] notes from April 2016. This was a doctor Agarwal- antal. 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 He was prescribed topical steroid high potency under occlusion which helped but at this point the patient did not have a deep punched out wound. 12/18/16; the patient's wound is larger in terms of surface area however this surface looks better and there is less depth. The surrounding erythema also is better. The patient states that the wrap we put on came off 2 days ago when he has been using his compression stockings. He we are in the process of getting a dermatology  consult. 12/26/16 on evaluation today patient's left lower extremity wound shows evidence of infection with surrounding erythema noted. He has been tolerating the dressing changes but states that he has noted more discomfort. There is a larger area of erythema surrounding the wound. No fevers, chills, nausea, or vomiting noted at this time. With that being said the wound still does have slough covering the surface. He is not allergic to any medication that he is aware of at this point. In regard to his right lower extremity he had  several regions that are erythematous and pruritic he wonders if there's anything we can do to help that. 01/02/17 I reviewed patient's wound culture which was obtained his visit last week. He was placed on doxycycline at that point. Unfortunately that does not appear to be an antibiotic that would likely help with the situation however the pseudomonas noted on culture is sensitive to Cipro. Also unfortunately patient's wound seems to have a large compared to last week's evaluation. Not severely so but there are definitely increased measurements in general. He is continuing to have discomfort as well he writes this to be a seven out of 10. In fact he would prefer me not to perform any debridement today due to the fact that he is having discomfort and considering he has an active infection on the little reluctant to do so anyway. No fevers, Gluth, Olegario ([id number]) chills, nausea, or vomiting noted at this time. 01/08/17; patient seems dermatology on September 5. I suspect dermatology will want the slides from the biopsy I did sent to their pathologist. I'm not sure if there is a way we can expedite that. In any case the culture I did before I left on vacation 3 weeks ago showed Pseudomonas he was given 10 days of Cipro and per her description of her intake nurses is actually somewhat better this week although the wound is quite a bit bigger than I remember the last time I  saw this. He still has 3 more days of Cipro 01/21/17; dermatology appointment tomorrow. He has completed the ciprofloxacin for Pseudomonas. Surface of the wound looks better however he is had some deterioration in the lesions on his right leg. Meantime the left lateral leg wound we will continue with sample 01/29/17; patient had his dermatology appointment but I can't yet see that note. He is completed his antibiotics. The wound is more superficial but considerably larger in circumferential area than when he came in. This is in his left lateral calf. He also has swollen erythematous areas with superficial wounds on the right leg and small papular areas on both arms. There apparently areas in her his upper thighs and buttocks I did not look at those. Dermatology biopsied the right leg. Hopefully will have their input next week. 02/05/17; patient went back to see his dermatologist who told him that he had a "scratching problem" as well as staph. He is now on a 30 day course of doxycycline and I believe she gave him triamcinolone cream to the right leg areas to help with the itching [not exactly sure but probably triamcinolone]. She apparently looked at the left lateral leg wound although this was not rebiopsied and I think felt to be ultimately part of the same pathogenesis. He is using sample border foam and changing nevus himself. He now has a new open area on the right posterior leg which was his biopsy site I don't have any of the dermatology notes 02/12/17; we put the patient in compression last week with SANTYL to the wound on the left leg and the biopsy. Edema is much better and the depth of the wound is now at level of skin. Area is still the same Biopsy site on the right lateral leg we've also been using santyl with a border foam dressing and he is changing this himself. 02/19/17; Using silver alginate started last week to both the substantial left leg wound and the biopsy site on the right  wound. He is tolerating compression well. Has a an appointment with  his primary M.D. tomorrow wondering about diuretics although I'm wondering if the edema problem is actually lymphedema 02/26/17; the patient has been to see his primary doctor Dr. Jerrel Ivory at [place] our primary care. She started him on Lasix 20 mg and this seems to have helped with the edema. However we are not making substantial change with the left lateral calf wound and inflammation. The biopsy site on the right leg also looks stable but not really all that different. 03/12/17; the patient has been to see vein and vascular Dr. Lucky Cowboy. He has had venous reflux studies I have not reviewed these. I did get a call from his dermatology office. 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 They therefore recommended clobetasol and tetracycline i.e. 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 03/26/17; apparently the patient just had reflux studies not an appointment with Dr. dew. She arrives in clinic today having applied clobetasol for 2-3 weeks. He notes over the last 2-3 days excessive drainage having to change the dressing 3-4 times a day and also expanding erythema. 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 Lillia Carmel at [facility] dermatology locally who kindly saw him at my request. 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 My concern was the  degree of erythema medially extending from his left leg wound which was either inflammation from pyoderma or cellulitis. I put him on Augmentin however culture of the wound showed Pseudomonas which is quinolone sensitive. I really don't believe he has cellulitis however in view of everything I will continue and give him a course of Cipro. He is also on doxycycline as an immune modulator for the pyoderma. In addition to his original wound on the left lateral leg with surrounding erythema he has a wound on the right posterior calf which was an original biopsy site done by dermatology. 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 Saw Dr. Lillia Carmel yesterday. He has been using topical antibiotics to both wound areas his original wound on the left and the biopsies/pathergy area on the right. There is definitely some improvement in the inflammation around the wound on the right although the patient states he has increasing sensitivity of the wounds. He is on prednisone 60 and doxycycline 1 as prescribed by Dr. Lillia Carmel. He is covering the topical antibiotic with gauze and putting this in his own compression stocks and changing this daily. He states that Dr. Lottie Rater did a culture of the left leg wound yesterday 05/07/17; pyoderma gangrenosum. The patient saw Dr. Lillia Carmel yesterday and has a follow-up with her in one month. He is still using topical antibiotics to both wounds although he can't recall exactly what type. He is still on prednisone 60 mg. Dr. Lillia Carmel stated that the doxycycline could stop if we were in agreement. He has been using his own compression stocks changing daily 06/11/17; pyoderma gangrenosum with wounds on the left lateral leg and  right medial leg. The right medial leg was induced by [name] ([id number]) biopsy/pathergy. The area on the right is essentially healed. 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 The right medial leg has closed and remains closed. He is still on prednisone 60. He tells me he missed his last dermatology appointment with Dr. Lillia Carmel but will make another appointment. He reports that her blood sugar at a recent screen in [place] was high 200's. He was 180 today. 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 I think he is going to require still much longer prednisone perhaps another 3 months before attempting to taper. In the meantime his wound is a lot better. Smaller. He is cleaning this off daily and applying topical antibiotics. When he was last in the clinic I thought about changing to [facility] and actually put in a couple of calls to dermatology although probably not during their business hours. 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 He continues on oral prednisone. He has been using triple antibiotic ointment. There is surface debris and we will transition to [facility] and have him return in 2 weeks. He has lost 30 pounds since his last appointment with lifestyle modification. He may benefit from topical steroid cream for treatment this can be considered at a later date. 08/22/17 on evaluation today patient appears to actually be doing rather well in regard to his left lateral lower extremity ulcer. He has actually been managed by Dr. Dellia Nims most recently. Patient is currently on oral steroids at this time. This seems to have been of benefit for him. Nonetheless his last visit was actually with Leah on 08/06/17. Currently he is not utilizing any topical steroid creams although this could be of benefit as well. No fevers, chills, nausea, or vomiting noted at this time. 09/05/17 on evaluation today patient appears to be doing better in regard to his left lateral  lower extremity ulcer. He has been tolerating the dressing changes without complication. He is using Santyl with good effect. Overall I'm very pleased with how things are standing at this point. Patient likewise is happy that this is doing better. 09/19/17 on evaluation today patient actually appears to be doing rather well in regard to his left lateral lower extremity ulcer. 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 He's not having any significant pain. 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 He has been tolerating the Santyl without complication and in general I feel like he's making good progress. 10/17/17 on evaluation today patient appears to be doing very well in regard to his left lateral lower surety ulcer. He has been tolerating the dressing changes without complication. There does not appear to be any evidence of infection he's alternating the Santyl and the triple antibiotic ointment every other day this seems to be doing well for him. 11/03/17 on evaluation today patient appears to be doing very well in regard to his left lateral lower extremity ulcer. He is been tolerating the dressing changes without complication which is good news. Fortunately there does not appear to be any evidence of infection  which is also great news. Overall is doing excellent they are starting to taper down on the prednisone is down to 40 mg at this point it also started topical clobetasol for him. 11/17/17 on evaluation today patient appears to be doing well in regard to his left lateral lower surety ulcer. He's been tolerating the dressing changes without complication. He does note that he is having no pain, no excessive drainage or discharge, and overall he feels like things are going about how he would expect and hope they  would. Overall he seems to have no evidence of infection at this time in my opinion which is good news. 12/04/17-He is seen in follow-up evaluation for right lateral lower extremity ulcer. He has been applying topical steroid cream. Today's measurement show slight increase in size. Over the next 2 weeks we will transition to every other day Santyl and steroid cream. He has been encouraged to monitor for changes and notify clinic with any concerns 12/15/17 on evaluation today patient's left lateral motion the ulcer and fortunately is doing worse again at this point. This just since last week to this week has close to doubled in size according to the patient. I did not seeing last week's I do not have a visual to compare this to in our system was also down so we do not have all the charts and at this point. Nonetheless it does have me somewhat concerned in regard to the fact that again he was worried enough about it he has contact the dermatology that placed them back on the full strength, 50 mg a day of the prednisone that he was taken previous. He continues to alternate using clobetasol along with Santyl at this point. He is obviously somewhat frustrated. 12/22/17 on evaluation today patient appears to be doing a little worse compared to last evaluation. Unfortunately the wound is a little deeper and slightly larger than the last week's evaluation. With that being said he has made some progress in regard to the irritation surrounding at this time unfortunately despite that progress that's been made he still has a significant issue going on here. I'm not certain that he is having really any true infection at this time although with the Pyoderma gangrenosum it can Wimmer, Shelia ([id number]) sometimes be difficult to differentiate infection versus just inflammation. For that reason I discussed with him today the possibility of perform a wound culture to ensure there's nothing overtly infected. 01/06/18 on  evaluation today patient's wound is larger and deeper than previously evaluated. With that being said it did appear that his wound was infected after my last evaluation with him. Subsequently I did end up prescribing a prescription for Bactrim DS which she has been taking and having no complication with. Fortunately there does not appear to be any evidence of infection at this point in time as far as anything spreading, no want to touch, and overall I feel like things are showing signs of improvement. 01/13/18 on evaluation today patient appears to be even a little larger and deeper than last time. There still muscle exposed in the base of the wound. Nonetheless he does appear to be less erythematous I do believe inflammation is calming down also believe the infection looks like it's probably resolved at this time based on what I'm seeing. No fevers, chills, nausea, or vomiting noted at this time. 01/30/18 on evaluation today patient actually appears to visually look better for the most part. Unfortunately those visually this looks better he does  seem to potentially have what may be an abscess in the muscle that has been noted in the central portion of the wound. This is the first time that I have noted what appears to be fluctuance in the central portion of the muscle. With that being said I'm somewhat more concerned about the fact that this might indicate an abscess formation at this location. I do believe that an ultrasound would be appropriate. This is likely something we need to try to do as soon as possible. He has been switch to mupirocin ointment and he is no longer using the steroid ointment as prescribed by dermatology he sees them again next week he's been decreased from 60 to 40 mg of prednisone. 03/09/18 on evaluation today patient actually appears to be doing a little better compared to last time I saw him. There's not as much erythema surrounding the wound itself. He I did review his  most recent infectious disease note which was dated 02/24/18. He saw Dr. Michel Bickers in [place]. 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 Patient is now off of antibiotics and they are continually observing this. There seems to be no change in the past two weeks in my pinion based on what the patient says and what I see today compared to what Dr. Megan Salon likely saw two weeks ago. No fevers, chills, nausea, or vomiting noted at this time. Patient History Information obtained from Patient. Family History Diabetes - Mother,Maternal Grandparents, Heart Disease - Mother,Maternal Grandparents, Hereditary Spherocytosis - Siblings, Hypertension - Maternal Grandparents, Stroke - Siblings, No family history of Cancer, Kidney Disease, Lung Disease, Seizures, Thyroid Problems, Tuberculosis. Social History Current some day smoker, Marital Status - Married, Alcohol Use - Rarely, Drug Use - No History, Caffeine Use - Moderate. Review of Systems (ROS) Constitutional Symptoms (General Health) Denies complaints or symptoms of Fever, Chills. Respiratory The patient has no complaints or symptoms. Cardiovascular The patient has no complaints or symptoms. Psychiatric The patient has no complaints or symptoms. JAKIAH, GOREE ([id number]) Objective Constitutional Obese and well-hydrated in no acute distress. Vitals Time Taken: 8:28 AM, Height: 71 in, Weight: 338 lbs, BMI: 47.1, Temperature: 98.3 F, Pulse: 86 bpm, Respiratory Rate: 16 breaths/min, Blood Pressure: 144/73 mmHg. Respiratory normal breathing without difficulty. clear to auscultation bilaterally. Cardiovascular regular rate and rhythm with normal S1, S2. Psychiatric this patient is able to make decisions and demonstrates good insight into disease process. Alert and Oriented x 3. pleasant and cooperative. General Notes: Patient's wound bed currently is  pretty much left covered of the entirety of the wound I was actually able to mechanically debride away some of the slough with saline and gauze. He tolerated this decently well today without complication post debridement the wound bed appears to be doing better although there was still a large portion especially on the anterior portion of the wound which was thickly Van covered. I really believe that the patient may benefit from use of Santyl at this point. Integumentary (Hair, Skin) Wound #1 status is Open. Original cause of wound was Gradually Appeared. The wound is located on the Left,Lateral Lower Leg. The wound measures 3.8cm length x 4.5cm width x 0.6cm depth; 13.43cm^2 area and 8.058cm^3 volume. There is Fat Layer (Subcutaneous Tissue) Exposed exposed. There is no tunneling or undermining noted. There is a large amount of serous drainage noted. The wound margin is distinct with the outline attached  to the wound base. There is small (1-33%) red, pink granulation within the wound bed. There is a large (67-100%) amount of necrotic tissue within the wound bed including Adherent Slough. The periwound skin appearance exhibited: Induration, Erythema. The periwound skin appearance did not exhibit: Callus, Crepitus, Excoriation, Rash, Scarring, Dry/Scaly, Maceration, Atrophie Blanche, Cyanosis, Ecchymosis, Hemosiderin Staining, Mottled, Pallor, Rubor. The surrounding wound skin color is noted with erythema which is circumferential. Periwound temperature was noted as No Abnormality. The periwound has tenderness on palpation. 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 There was a Chemical/Enzymatic/Mechanical debridement performed by [facility], Ancil Dewan E., PA-C. With the following instrument(s): tongue blade after achieving pain control using Lidocaine. Agent used was [employer]. A time out was conducted at 08:50, prior to the start of the procedure. There was no bleeding. The procedure was tolerated well. Post Debridement Measurements: 3.8cm length x 4.5cm width x 0.6cm depth; 8.058cm^3 volume. Character of Wound/Ulcer Post Debridement is stable. Post procedure Diagnosis Wound #1: Same as Pre-Procedure Plan Wound Cleansing: Wound #1 Left,Lateral Lower Leg: May Shower, gently pat wound dry prior to applying new dressing. Anesthetic (add to Medication List): Wound #1 Left,Lateral Lower Leg: Topical Lidocaine 4% cream applied to wound bed prior to debridement (In Clinic Only). Primary Wound Dressing: Wound #1 Left,Lateral Lower Leg: Santyl Ointment Secondary Dressing: Wound #1 Left,Lateral Lower Leg: Other - Gauze and xtrasorb Dressing Change Frequency: Wound #1 Left,Lateral Lower Leg: Change dressing every day. Follow-up Appointments: Wound #1 Left,Lateral Lower Leg: Return Appointment in 1 week. Edema Control: Wound #1 Left,Lateral Lower Leg: Patient to wear own compression stockings Elevate legs to the level of the heart and pump ankles as often as possible Medications-please add to medication list.: Wound #1 Left,Lateral Lower Leg: Other: - Continue steroids Patient's wound bed currently does look like it would benefit from use of Santyl. I'm gonna go ahead and initiate that back with them at this point he actually has that at home already. Will subsequently see were things stand at follow-up in two weeks time. If anything changes or worsens in the meantime he will let me know. Please see above for specific wound care orders. We will see patient for re-evaluation in 2 week(s) here in the clinic. If anything worsens or changes patient will contact our office for additional recommendations. Electronic  Signature(s) Signed: 03/13/2018 8:35:57 AM By: Worthy Keeler PA-C Entered By: Worthy Keeler on 03/09/2018 09:02:55 MACIAH, SCHWEIGERT ([id number]) -------------------------------------------------------------------------------- ROS/PFSH Details Patient Name: [name] Date of Service: 03/09/2018 8:30 AM Medical Record Number: [id number] Patient Account Number: [id number] Date of Birth/Sex: 09-18-1980 (37 y.o. M) Treating RN: [name] Primary Care Provider: [name] Other Clinician: Referring Provider: [name] Treating Provider/Extender: Melburn Hake, Frederic Tones Weeks in Treatment: 68 Information Obtained From Patient Wound History Do you currently have one or more open woundso Yes How many open wounds do you currently haveo 1 Approximately how long have you had your woundso 1 year How have you been treating your wound(s) until nowo neosporin Has your wound(s) ever healed and then re-openedo No Have you had any lab work done in the past montho No Have you tested positive for an antibiotic resistant organism (MRSA, VRE)o No Have you tested positive for osteomyelitis (bone infection)o No Have you had any tests for circulation  on your legso No Have you had other problems associated with your woundso Infection, Swelling Constitutional Symptoms (General Health) Complaints and Symptoms: Negative for: Fever; Chills Eyes Medical History: Negative for: Cataracts; Glaucoma; Optic Neuritis Ear/Nose/Mouth/Throat Medical History: Negative for: Chronic sinus problems/congestion; Middle ear problems Hematologic/Lymphatic Medical History: Negative for: Anemia; Hemophilia; Human Immunodeficiency Virus; Lymphedema; Sickle Cell Disease Respiratory Complaints and Symptoms: No Complaints or Symptoms Medical History: Positive for: Sleep Apnea - cpap Negative for: Aspiration; Asthma; Chronic Obstructive Pulmonary Disease (COPD); Pneumothorax Cardiovascular Complaints and Symptoms: No  Complaints or Symptoms Medical History: Positive for: Hypertension Trebilcock, Rael ([id number]) Negative for: Angina; Arrhythmia; Congestive Heart Failure; Coronary Artery Disease; Deep Vein Thrombosis; Hypotension; Myocardial Infarction; Peripheral Arterial Disease; Peripheral Venous Disease; Phlebitis; Vasculitis Gastrointestinal Medical History: Positive for: Colitis Endocrine Medical History: Negative for: Type I Diabetes; Type II Diabetes Genitourinary Medical History: Negative for: End Stage Renal Disease Immunological Medical History: Negative for: Lupus Erythematosus; Raynaudos; Scleroderma Integumentary (Skin) Medical History: Negative for: History of Burn; History of pressure wounds Musculoskeletal Medical History: Negative for: Gout; Rheumatoid Arthritis; Osteoarthritis; Osteomyelitis Neurologic Medical History: Negative for: Dementia; Neuropathy; Quadriplegia; Paraplegia; Seizure Disorder Oncologic Medical History: Negative for: Received Chemotherapy; Received Radiation Psychiatric Complaints and Symptoms: No Complaints or Symptoms Medical History: Negative for: Anorexia/bulimia; Confinement Anxiety Immunizations Pneumococcal Vaccine: Received Pneumococcal Vaccination: No Implantable Devices Family and Social History LEORY, ALLINSON ([id number]) Cancer: No; Diabetes: Yes - Mother,Maternal Grandparents; Heart Disease: Yes - Mother,Maternal Grandparents; Hereditary Spherocytosis: Yes - Siblings; Hypertension: Yes - Maternal Grandparents; Kidney Disease: No; Lung Disease: No; Seizures: No; Stroke: Yes - Siblings; Thyroid Problems: No; Tuberculosis: No; Current some day smoker; Marital Status - Married; Alcohol Use: Rarely; Drug Use: No History; Caffeine Use: Moderate; Financial Concerns: No; Food, Clothing or Shelter Needs: No; Support System Lacking: No; Transportation Concerns: No; Advanced Directives: No; Patient does not want information on Advanced Directives;  Living Will: No Physician Affirmation I have reviewed and agree with the above information. Electronic Signature(s) Signed: 03/09/2018 6:05:50 PM By: [name], [name], RN, [name], [name], [name] Signed: 03/13/2018 8:35:57 AM By: Worthy Keeler PA-C Entered By: Worthy Keeler on 03/09/2018 09:01:53 ADVAIT, BUICE ([id number]) -------------------------------------------------------------------------------- SuperBill Details Patient Name: [name] Date of Service: 03/09/2018 Medical Record Number: [id number] Patient Account Number: [id number] Date of Birth/Sex: 1980-12-28 (37 y.o. M) Treating RN: [name] Primary Care Provider: [name] Other Clinician: Referring Provider: [name] Treating Provider/Extender: Melburn Hake, Rosetta Rupnow Weeks in Treatment: 58 Diagnosis Coding ICD-10 Codes Code Description 7631861581 Non-pressure chronic ulcer of left calf with fat layer exposed L88 Pyoderma gangrenosum I87.2 Venous insufficiency (chronic) (peripheral) Facility Procedures CPT4 Code: 94496759 Description: 16384 - DEBRIDE W/O ANES NON SELECT Modifier: Quantity: 1 Physician Procedures CPT4 Code: 6659935 Description: 70177 - WC PHYS LEVEL 3 - EST PT ICD-10 Diagnosis Description I87.2 Venous insufficiency (chronic) (peripheral) L97.222 Non-pressure chronic ulcer of left calf with fat layer expo L88 Pyoderma gangrenosum Modifier: sed Quantity: 1 Electronic Signature(s) Signed: 03/13/2018 8:35:57 AM By: Worthy Keeler PA-C Entered By: Worthy Keeler on 03/09/2018 09:03:13

## 2018-03-17 ENCOUNTER — Ambulatory Visit (INDEPENDENT_AMBULATORY_CARE_PROVIDER_SITE_OTHER): Payer: 59 | Admitting: Internal Medicine

## 2018-03-17 ENCOUNTER — Encounter: Payer: Self-pay | Admitting: Internal Medicine

## 2018-03-17 DIAGNOSIS — L88 Pyoderma gangrenosum: Secondary | ICD-10-CM | POA: Diagnosis not present

## 2018-03-17 NOTE — Progress Notes (Signed)
[facility]  Patient Active Problem List   Diagnosis Date Noted  . 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 Acute non-recurrent sinusitis 03/05/2018  . Ulcerative colitis (HCC) 03/05/2018  . Venous stasis 02/24/2018  . Chronic pain of left knee 12/05/2017  . Chronic pain of left ankle 12/05/2017  . Type 2 diabetes mellitus (HCC) 07/15/2017  . Lower extremity edema 02/20/2017  . Seasonal allergic rhinitis 11/25/2016  . Depression 04/17/2015  . Hyperlipidemia 04/17/2015    Patient's Medications  New Prescriptions   No medications on file  Previous Medications   ACETAMINOPHEN (TYLENOL) 500 MG TABLET    Take 1,000 mg by mouth every 6 (six) hours as needed for headache (pain).   CETIRIZINE (ZYRTEC) 10 MG TABLET    Take 1 tablet (10 mg total) by mouth daily.   CLOBETASOL OINTMENT (TEMOVATE) 0.05 %    Apply to wound once daily   DAPSONE 100 MG TABLET    Take 100 mg by mouth daily.   IBUPROFEN (ADVIL,MOTRIN) 200 MG TABLET    Take 400-600 mg by mouth every 6 (six) hours as needed for headache (pain).   METFORMIN (GLUCOPHAGE) 1000 MG TABLET    Take 1 tablet (1,000 mg total) by mouth 2 (two) times daily with a meal.   PREDNISONE (DELTASONE) 10 MG TABLET    Start 20 mg daily for 2 weeks then decrease to 10 mg daily for 2 weeks then stop.   RANITIDINE (ZANTAC) 150 MG TABLET    Take 150 mg by mouth 2 (two) times daily as needed for heartburn (acid reflux).    VENLAFAXINE XR (EFFEXOR-XR) 150 MG 24 HR CAPSULE    TAKE 1 CAPSULE BY MOUTH  DAILY WITH BREAKFAST  Modified Medications   No medications on file  Discontinued Medications   No medications on file    Subjective: [name] is in for his routine follow-up visit.  He started on dapsone for his left calf pyoderma gangrenosum about 6 weeks ago.  He has noted gradual improvement in his large ulcer.  He did have a bout of sinusitis since his last visit here and was treated with a 10-day course of  amoxicillin.  He had been tapering his prednisone.  He was on 20 mg daily at the time of his first visit here 3 weeks ago.  He did decrease to 10 mg daily but began to notice increase itching and new skin lesions on his right calf and increase back to 20 mg daily.  He is due to see his dermatologist tomorrow.  Review of Systems: Review of Systems  Constitutional: Negative for chills, diaphoresis and fever.  Gastrointestinal: Negative for abdominal pain, diarrhea, nausea and vomiting.  Skin: Positive for itching and rash.    Past Medical History:  Diagnosis Date  . Blood in stool   . Depression   . Elevated blood pressure   . Hyperlipidemia   . OSA (obstructive sleep apnea) 2014  . Seasonal allergies   . Ulcerative colitis (HCC) 03/05/2018    Social History   Tobacco Use  . Smoking status: Former Games developer  . Smokeless tobacco: Former Neurosurgeon    Quit date: 03/17/2015  . Tobacco comment: Quit in [place] smoker for 9 years  Substance Use Topics  . Alcohol use: Yes    Alcohol/week: 0.0 standard drinks    Comment: soical  . Drug use: Not on file    Family History  Problem Relation Age of Onset  . Alcohol abuse Paternal Aunt   . Alcohol abuse Paternal Uncle   . Stroke Paternal Uncle   . Hyperlipidemia Maternal Grandfather   . Hypertension Maternal Grandfather   . Diabetes Maternal Grandfather   . Alcohol abuse Maternal Grandfather   . Multiple sclerosis Mother   . Dementia Mother     Allergies  Allergen Reactions  . Biaxin [Clarithromycin] Other (See Comments)    Causes colitis flares  . Milk-Related Compounds Hives    Objective: Vitals:   03/17/18 1125  BP: (!) 142/87  Pulse: 89  Temp: 98.5 F (36.9 C)  Weight: (!) 360 lb (163.3 kg)   Body mass index is 48.82 kg/m.  Physical Exam  Constitutional:  He is in good spirits.  Skin:  Chronic ulcer on his lateral left calf looks better.  The ulcer is not as deep and there is not as much yellow exudate.   There seems to be some healing around the edges.  He has some yellow drainage but no odor.       Lab Results    Problem List Items Addressed This Visit      High   Pyoderma gangrenosum    He seems to be improving coincident to starting dapsone 6 weeks ago.  He has been colonized with MRSA so I would not expect that his 10-day course of amoxicillin would make any difference.  I am comfortable with continued observation off of antibiotics.  He feels like his skin lesions have started to flare with his recent steroid taper.  I suggested that he talk to Dr. Carles Collet tomorrow about how to proceed with the taper.  He will follow-up here in 6 weeks.          Cliffton Asters, MD [facility] [phone] pager   [phone] cell 03/17/2018, 11:54 AM

## 2018-03-17 NOTE — Assessment & Plan Note (Signed)
He seems to be improving coincident to starting dapsone 6 weeks ago.  He has been colonized with MRSA so I would not expect that his 10-day course of amoxicillin would make any difference.  I am comfortable with continued observation off of antibiotics.  He feels like his skin lesions have started to flare with his recent steroid taper.  I suggested that he talk to Dr. Carles Collet tomorrow about how to proceed with the taper.  He will follow-up here in 6 weeks.

## 2018-03-18 ENCOUNTER — Ambulatory Visit
Admit: 2018-03-18 | Discharge: 2018-03-19 | Payer: PRIVATE HEALTH INSURANCE | Attending: Dermatology | Primary: Dermatology

## 2018-03-18 DIAGNOSIS — Z79899 Other long term (current) drug therapy: Secondary | ICD-10-CM

## 2018-03-18 DIAGNOSIS — L88 Pyoderma gangrenosum: Principal | ICD-10-CM

## 2018-03-18 DIAGNOSIS — R21 Rash and other nonspecific skin eruption: Secondary | ICD-10-CM

## 2018-03-18 MED ORDER — DAPSONE 100 MG TABLET
ORAL_TABLET | Freq: Every day | ORAL | 2 refills | 0 days | Status: CP
Start: 2018-03-18 — End: 2018-06-03

## 2018-03-18 MED ORDER — PREDNISONE 10 MG TABLET
ORAL_TABLET | 0 refills | 0 days | Status: CP
Start: 2018-03-18 — End: 2018-09-24

## 2018-03-18 MED ORDER — TRIAMCINOLONE ACETONIDE 0.1 % TOPICAL CREAM
Freq: Two times a day (BID) | TOPICAL | 2 refills | 0.00000 days | Status: CP
Start: 2018-03-18 — End: 2018-09-24

## 2018-03-18 NOTE — Unmapped (Signed)
Dermatology Clinic Note    ASSESSMENT AND PLAN:    MRSA abscess in the setting of almost resolved pyoderma gangrenosum and chronic steroid use c/b cushingoid features, now with deep recurrent ulcer, improving: Improving in the setting of treatment for MRSA and weaning off of prednisone. Not concerning for recurrence of PG at this time. Patient starting to create new areas related to picking behaviors / itching while weaning off of prednisone. Reviewed importance of avoiding trauma and will do a slow taper of prednisone coupled with use of TAC cream. Discussed wearing long pants when possible to prevent access.  - cont abx per ID  - Decrease prednisone to 10 mg alternating with 20 mg x 1 week, then 10 mg daily  - humira denied but will appeal given persistent recalcitrant PG in the setting of recurrent infections and side effects from prednisone with weight gain and cushingoid effects, pt has been on prednisone since November 2018, Goal to be off of prednisone by December as a chronic medication  -  bactroban ointment to wound, replacing clobetasol, otherwise wound care per wound  - Cont OTC calcium and Vit D  -  Recommend compression to alleviate venous stasis component    High risk medication use - dapsone: previous hiv, hep b/c unremarkable on 10/18  -quant gold neg in 919  -g6pd nl  Reviewed risks of dapsone including anemia, severe drug reactions, liver toxicity, motor neuropathy  ??        RTC: 2  weeks  SUBJECTIVE    CC: f/u ulcers on leg    HPI:  This is a pleasant 37 y.o. male who presents today for follow up of non-healing ulcers. LV with myself on 9/19.Was seen by wound and culture demonstrated MRSA, now improved with abx and followed by ID. States he is getting good regrowth without worsening of the ulcer. Tried to decrease prednisone to 10 mg daily but became too itchy and started picking at new areas on his legs.       Disease history:  -Starting about 2013, he started to develop itchy bumps that enlarge when he scratches and picks at them. This initially affected just his lower legs, but more recently prior to initial presentation to our clinic, involvement spread to his thighs, buttocks, arms, and forearms.  -Areas start as tiny bumps that he then scratches. 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   -In spring 2018, an ulcer started on the L lateral calf and gradually enlarged, but was slowly healing and shrinking by the time of presentation to our clinic. Denies any trauma at this site and notes that it started similar to other rashes on skin. Prior treatments include oral abx, including as recently as 12/2016 with courses of clinda and doxy, as well as topical steroids. He has h/o ulcerative colitis, now in remission without meds since 2015. Denies h/o liver or kidney disease or DM. Used to live in [place] when this started. Works for [employer] and is on feet all day, endorses swelling of legs. Denies new meds prior to onset.  - At his initial visit 01/22/2017, clobetasol ointment and doxycycline 100mg  bid x 4 weeks were started. A biopsy of a keratotic pruritic plaque was obtained. 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 Tissue culture grew 4+ MSSA. 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   - w/up for underlying pruritus (given patient report  and difficulty not picking at the involved areas) on 10/17 revealed unremarkable HIV, hepatitis panel, LDH, TSH, free T4 . Outside slides from previous bx were reviewed by Dr. Eyvonne Left and showed venous stasis changes as well as a neutrophilic infiltrate that could be consistent with PG. He was started on topical clobetasol BID. He was continued on PO doxycycline for additional 6 weeks. His left leg ulcer continued to grow and the site of his previous biopsy on the posterior right leg has also developed a large non healing ulcer.   - Repeat biopsy as below done on 03/26/17 primarily c/w PG vs infectious with one potential organism on Fite stain but unclear if related.   - Patient started on prednisone on 04/01/17 with almost resolution by 7/19.  -Patient has recurrence of PG in the setting of MRSA - start process for humira however this was denied,   -started dapsone 50 mg on 9/19  Final Diagnosis   Date Value Ref Range Status   03/26/2017   Final    Left lateral shin, punch  - Suppurative inflammation with sinus tract formation, debris, and reactive changes. See comment.          Pertinent PMH:   Ulcerative colitis, in remission  HTN  DM   Denies h/o liver, kidney abnormalities     ROS: A balance of 10 systems was reviewed and negative.     PE:  General: Well-developed, well-nourished male in no acute distress, resting comfortably.  Neuro: Alert and oriented, answers questions appropriately.  Skin: Examination of face, neck, bilateral lower extremities, , , bilateral arms and hands was notable for:  - Left lateral lower leg with large ulcer with reepithelialized skin at the edges, ulcer is thinning  -erosion on the R leg    -All other areas examined were normal or had no significant findings.

## 2018-03-23 ENCOUNTER — Encounter: Payer: 59 | Attending: Physician Assistant | Admitting: Physician Assistant

## 2018-03-23 DIAGNOSIS — F17200 Nicotine dependence, unspecified, uncomplicated: Secondary | ICD-10-CM | POA: Insufficient documentation

## 2018-03-23 DIAGNOSIS — I872 Venous insufficiency (chronic) (peripheral): Secondary | ICD-10-CM | POA: Insufficient documentation

## 2018-03-23 DIAGNOSIS — Z79899 Other long term (current) drug therapy: Secondary | ICD-10-CM | POA: Diagnosis not present

## 2018-03-23 DIAGNOSIS — L88 Pyoderma gangrenosum: Secondary | ICD-10-CM | POA: Insufficient documentation

## 2018-03-23 DIAGNOSIS — I10 Essential (primary) hypertension: Secondary | ICD-10-CM | POA: Diagnosis not present

## 2018-03-23 DIAGNOSIS — L97225 Non-pressure chronic ulcer of left calf with muscle involvement without evidence of necrosis: Secondary | ICD-10-CM | POA: Diagnosis not present

## 2018-03-23 DIAGNOSIS — L97222 Non-pressure chronic ulcer of left calf with fat layer exposed: Secondary | ICD-10-CM | POA: Insufficient documentation

## 2018-03-24 LAB — BLOOD UREA NITROGEN: Lab: 10

## 2018-03-24 LAB — CBC W/ DIFFERENTIAL
BANDED NEUTROPHILS ABSOLUTE COUNT: 0.1 10*3/uL (ref 0.0–0.1)
EOSINOPHILS ABSOLUTE COUNT: 0.2 10*3/uL (ref 0.0–0.4)
EOSINOPHILS RELATIVE PERCENT: 2 %
HEMATOCRIT: 42.9 % (ref 37.5–51.0)
IMMATURE GRANULOCYTES: 1 %
LYMPHOCYTES ABSOLUTE COUNT: 2.2 10*3/uL (ref 0.7–3.1)
LYMPHOCYTES RELATIVE PERCENT: 22 %
MEAN CORPUSCULAR HEMOGLOBIN CONC: 32.6 g/dL (ref 31.5–35.7)
MEAN CORPUSCULAR HEMOGLOBIN: 28.8 pg (ref 26.6–33.0)
MEAN CORPUSCULAR VOLUME: 88 fL (ref 79–97)
MONOCYTES ABSOLUTE COUNT: 0.8 10*3/uL (ref 0.1–0.9)
MONOCYTES RELATIVE PERCENT: 8 %
NEUTROPHILS ABSOLUTE COUNT: 7 10*3/uL (ref 1.4–7.0)
NEUTROPHILS RELATIVE PERCENT: 66 %
PLATELET COUNT: 228 10*3/uL (ref 150–450)
RED BLOOD CELL COUNT: 4.86 x10E6/uL (ref 4.14–5.80)
RED CELL DISTRIBUTION WIDTH: 12 % — ABNORMAL LOW (ref 12.3–15.4)
WHITE BLOOD CELL COUNT: 10.3 10*3/uL (ref 3.4–10.8)

## 2018-03-24 LAB — ALT (SGPT): Lab: 35

## 2018-03-24 LAB — CREATININE: GFR MDRD AF AMER: 118 mL/min/{1.73_m2}

## 2018-03-24 LAB — AST (SGOT): Lab: 19

## 2018-03-24 LAB — GFR MDRD AF AMER: Lab: 118

## 2018-03-24 LAB — BANDED NEUTROPHILS ABSOLUTE COUNT: Lab: 0.1

## 2018-03-24 NOTE — Unmapped (Signed)
Patient labs stable. Patient notified via mychart. Will increase dapsone to 100 mg daily

## 2018-03-28 NOTE — Progress Notes (Signed)
SWAIN, ACREE ([id number]) Visit Report for 03/23/2018 Chief Complaint Document Details Patient Name: [name] Date of Service: 03/23/2018 8:00 AM Medical Record Number: [id number] Patient Account Number: [id number] Date of Birth/Sex: 1981/03/05 (37 y.o. 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 M) Treating RN: [name] Primary Care Provider: [name] Other Clinician: Referring Provider: [name] Treating Provider/Extender: Melburn Hake, Marcia Hartwell Weeks in Treatment: 55 Debridement Performed for Wound #1 Left,Lateral Lower Leg Assessment: Performed By: Physician STONE III, Allsion Nogales E., PA-C Debridement Type: Debridement Level of Consciousness (Pre- Awake and Alert procedure): Pre-procedure Verification/Time Yes - 08:19 Out Taken: Start Time: 08:19 Pain Control: Lidocaine Total Area Debrided (L x W): 3.5 (cm) x 4.5 (cm) = 15.75 (cm) Tissue and other material [place] debrided: Level: Non-Viable Tissue Debridement Description: Selective/Open Wound Instrument: Other : gauze, cotton tipped applicator, saline Bleeding: Minimum Hemostasis Achieved: Pressure End Time: 08:22 Procedural Pain: 2 Post Procedural Pain: 2 Response to Treatment: Procedure was tolerated well Level of  Consciousness Awake and Alert (Post-procedure): Post Debridement Measurements of Total Wound Length: (cm) 3.5 Width: (cm) 4.5 Depth: (cm) 0.5 Volume: (cm) 6.185 Character of Wound/Ulcer Post Debridement: Stable Post Procedure Diagnosis Same as Pre-procedure Electronic Signature(s) Signed: 03/24/2018 11:39:33 AM By: Worthy Keeler PA-C Signed: 03/26/2018 10:02:59 AM By: [name], [name], RN, [name], [name], [name] Entered By: [name], [name], RN, [name], [name] on 03/23/2018 08:23:01 [name] ([id number]) -------------------------------------------------------------------------------- HPI Details Patient Name: [name] Date of Service: 03/23/2018 8:00 AM Medical Record Number: [id number] Patient Account Number: [id number] Date of Birth/Sex: 03-19-1981 (37 y.o. M) Treating RN: [name] Primary Care Provider: [name] Other Clinician: Referring Provider: [name] Treating Provider/Extender: Melburn Hake, Grae Cannata Weeks in Treatment: 77 History of Present Illness HPI Description: 12/04/16; 37 year old man who comes into the clinic today for review of a wound on the posterior left calf. He tells me that is been there for about a year. He is not a diabetic he does smoke half a pack per day. He was seen in the ER on 11/20/16 felt to have cellulitis around the wound and was given clindamycin. An x-ray did not show osteomyelitis. The patient initially tells me that he has a milk allergy that sets off a pruritic itching rash on his lower legs which she scratches incessantly and he thinks that's what may have set up the wound. He has been using various topical antibiotics and ointments without any effect. He works in a [employer] and is on his feet all day. He does not have a prior history of wounds however he does have the rash on both lower legs the right arm and the ventral aspect of his left arm. These are excoriations and clearly have had scratching however there are of macular looking areas  on both legs including a substantial larger area on the right leg. This does not have an underlying open area. There is no blistering. The patient tells me that 2 years ago in [place] in response to the rash on his legs  he saw a dermatologist who told him he had a condition which may be pyoderma gangrenosum although I may be putting words into his mouth. He seemed to recognize this. 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 He is not in any treatment for this. He's had no recent travel 12/11/16; the patient arrives today with his wound and roughly the same condition we've been using silver alginate this is a deep punched out wound with some surrounding erythema but no tenderness. Biopsy I did did not show confirmed pyoderma gangrenosum suggested nonspecific inflammation and vasculitis but does not provide an actual description of what was seen by the pathologist. I'm really not able to understand this We have also received information from the patient's dermatologist in [place] notes from April 2016. This was a doctor Agarwal- antal. 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 He was prescribed topical steroid high potency under occlusion which helped but at this point the patient did not have a deep punched out wound. 12/18/16; the patient's wound is larger in terms of surface area however this surface looks better and there is less depth. The surrounding erythema also is better. The patient states that the wrap we put on came off 2 days ago when he has been using his compression stockings. He we are in the process of getting a dermatology consult. 12/26/16 on evaluation today patient's left lower extremity wound shows evidence of infection with surrounding erythema noted. He has been tolerating the dressing changes but states that he has noted more discomfort. There is a larger area of erythema surrounding the wound. No fevers, chills, nausea, or  vomiting noted at this time. With that being said the wound still does have slough covering the surface. He is not allergic to any medication that he is aware of at this point. In regard to his right lower extremity he had several regions that are erythematous and pruritic he wonders if there's anything we can do to help that. 01/02/17 I reviewed patient's wound culture which was obtained his visit last week. He was placed on doxycycline at that point. Unfortunately that does not appear to be an antibiotic that would likely help with the situation however the pseudomonas noted on culture is sensitive to Cipro. Also unfortunately patient's wound seems to have a large compared to last week's evaluation. Not severely so but there are definitely increased measurements in general. He is continuing to have discomfort as well he writes this to be a seven out of 10. In fact he would prefer me not to perform any debridement today due to the fact that he is having discomfort and considering he has an active infection on the little reluctant to do so anyway. No fevers, chills, nausea, or vomiting noted at this time. 01/08/17; patient seems dermatology on September 5. I suspect dermatology will want the slides from the biopsy I did sent to their pathologist. I'm not sure if there is a way we can expedite that. In any case the culture I did before I left on vacation 3 weeks ago showed Pseudomonas he was given 10 days of Cipro and per her description of her intake nurses is actually somewhat better this week although the wound is quite a bit bigger than I remember the last time I saw this. He still has 3 more days of Cipro ALTON, BOUKNIGHT ([id number]) 01/21/17; dermatology appointment tomorrow. He has completed the ciprofloxacin for Pseudomonas. Surface of the wound looks  better however he is had some deterioration in the lesions on his right leg. Meantime the left lateral leg wound we will continue with  sample 01/29/17; patient had his dermatology appointment but I can't yet see that note. He is completed his antibiotics. The wound is more superficial but considerably larger in circumferential area than when he came in. This is in his left lateral calf. He also has swollen erythematous areas with superficial wounds on the right leg and small papular areas on both arms. There apparently areas in her his upper thighs and buttocks I did not look at those. Dermatology biopsied the right leg. Hopefully will have their input next week. 02/05/17; patient went back to see his dermatologist who told him that he had a "scratching problem" as well as staph. He is now on a 30 day course of doxycycline and I believe she gave him triamcinolone cream to the right leg areas to help with the itching [not exactly sure but probably triamcinolone]. She apparently looked at the left lateral leg wound although this was not rebiopsied and I think felt to be ultimately part of the same pathogenesis. He is using sample border foam and changing nevus himself. He now has a new open area on the right posterior leg which was his biopsy site I don't have any of the dermatology notes 02/12/17; we put the patient in compression last week with SANTYL to the wound on the left leg and the biopsy. Edema is much better and the depth of the wound is now at level of skin. Area is still the same oBiopsy site on the right lateral leg we've also been using santyl with a border foam dressing and he is changing this himself. 02/19/17; Using silver alginate started last week to both the substantial left leg wound and the biopsy site on the right wound. He is tolerating compression well. Has a an appointment with his primary M.D. tomorrow wondering about diuretics although I'm wondering if the edema problem is actually lymphedema 02/26/17; the patient has been to see his primary doctor Dr. Jerrel Ivory at [facility] our primary care. She started  him on Lasix 20 mg and this seems to have helped with the edema. However we are not making substantial change with the left lateral calf wound and inflammation. The biopsy site on the right leg also looks stable but not really all that different. 03/12/17; the patient has been to see vein and vascular Dr. Lucky Cowboy. He has had venous reflux studies I have not reviewed these. I did get a call from his dermatology office. 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 They therefore recommended clobetasol and tetracycline i.e. 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 03/26/17; apparently the patient just had reflux studies not an appointment with Dr. dew. She arrives in clinic today having applied clobetasol for 2-3 weeks. He notes over the last 2-3 days excessive drainage having to change the dressing 3-4 times a day and also expanding erythema. He states the expanding erythema seems to come and go and was last this red was earlier in the month.he is on doxycycline 150 mg twice a day as an anti-inflammatory systemic therapy for possible pyoderma gangrenosum along with the topical  clobetasol 04/02/17; the patient was seen last week by Dr. Lillia Carmel at [facility] dermatology locally who kindly saw him at my request. 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 My concern was the degree of erythema medially extending from his left leg wound which was either inflammation from pyoderma or cellulitis. I put him on Augmentin however culture of the wound showed Pseudomonas which is quinolone sensitive. I really don't believe he has cellulitis however in view of everything I will  continue and give him a course of Cipro. He is also on doxycycline as an immune modulator for the pyoderma. In addition to his original wound on the left lateral leg with surrounding erythema he has a wound on the right posterior calf which was an original biopsy site done by dermatology. 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 Saw Dr. Lillia Carmel yesterday. He has been using topical antibiotics to both wound areas his original wound on the left and the biopsies/pathergy area on the right. There is definitely some improvement in the inflammation around the wound on the right although the patient states he has increasing sensitivity of the wounds. He is on prednisone 60 and doxycycline 1 as prescribed by Dr. Lillia Carmel. He is covering the topical antibiotic with gauze and putting this in his own compression stocks and changing this daily. He states that Dr. Lottie Rater did a culture of the left leg wound yesterday 05/07/17; pyoderma gangrenosum. The patient saw Dr. Lillia Carmel yesterday and has a follow-up with her in one month. He is still using topical antibiotics to both wounds although he can't recall exactly what type. He is still on prednisone 60 mg. Dr. Lillia Carmel stated that the doxycycline could stop if we were in agreement. 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 The right medial leg was induced by biopsy/pathergy. The area on the right is essentially healed. 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 The right medial leg has closed and remains closed. He is still on prednisone 60. oHe tells me he missed his last dermatology appointment with Dr. Lillia Carmel but will make another appointment. He reports that her blood sugar at a recent screen  in [place] was high 200's. He was 180 today. He is more cushingoid blood pressure is Stann, Qunicy ([id number]) up a bit. I think he is going to require still much longer prednisone perhaps another 3 months before attempting to taper. In the meantime his wound is a lot better. Smaller. He is cleaning this off daily and applying topical antibiotics. When he was last in the clinic I thought about changing to [facility] and actually put in a couple of calls to dermatology although probably not during their business hours. 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 He continues on oral prednisone. He has been using triple antibiotic ointment. There is surface debris and we will transition to [facility] and have him return in 2 weeks. He has lost 30 pounds since his last appointment with lifestyle modification. He may benefit from topical steroid cream for treatment this can be considered at a later date. 08/22/17 on evaluation today patient appears to  actually be doing rather well in regard to his left lateral lower extremity ulcer. He has actually been managed by Dr. Dellia Nims most recently. Patient is currently on oral steroids at this time. This seems to have been of benefit for him. Nonetheless his last visit was actually with Leah on 08/06/17. Currently he is not utilizing any topical steroid creams although this could be of benefit as well. No fevers, chills, nausea, or vomiting noted at this time. 09/05/17 on evaluation today patient appears to be doing better in regard to his left lateral lower extremity ulcer. He has been tolerating the dressing changes without complication. He is using Santyl with good effect. Overall I'm very pleased with how things are standing at this point. Patient likewise is happy that this is doing better. 09/19/17 on evaluation today patient actually appears  to be doing rather well in regard to his left lateral lower extremity ulcer. 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 He's not having any significant pain. 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 He has been tolerating the Santyl without complication and in general I feel like he's making good progress. 10/17/17 on evaluation today patient appears to be doing very well in regard to his left lateral lower surety ulcer. He has been tolerating the dressing changes without complication. There does not appear to be any evidence of infection he's alternating the Santyl and the triple antibiotic ointment every other day this seems to be doing well for him. 11/03/17 on evaluation today patient appears to be doing very well in regard to his left lateral lower extremity ulcer. He is been tolerating the dressing changes without complication which is good news. Fortunately there does not appear to be any evidence of infection which is also great news. Overall is doing excellent they are starting to taper down on the prednisone is down to 40 mg at this point it also started topical clobetasol for him. 11/17/17 on evaluation today patient appears to be doing well in regard to his left lateral lower surety ulcer. He's been tolerating the dressing changes without complication. He does note that he is having no pain, no excessive drainage or discharge, and overall he feels like things are going about how he would expect and hope they would. Overall he seems to have no evidence of infection at this time in my opinion which is good news. 12/04/17-He is seen in follow-up evaluation for right lateral lower extremity ulcer. He has been applying topical steroid cream. Today's measurement show slight increase in size. Over the next 2 weeks  we will transition to every other day Santyl and steroid cream. He has been encouraged to monitor for changes and notify clinic with any concerns 12/15/17 on evaluation today patient's left lateral motion the ulcer and fortunately is doing worse again at this point. This just since last week to this week has close to doubled in size according to the patient. I did not seeing last week's I do not have a visual to compare this to in our system was also down so we do not have all the charts and at this point. Nonetheless it does have me somewhat concerned in regard to the fact that again he was worried enough about it he has contact the dermatology that placed them back on the full strength,  50 mg a day of the prednisone that he was taken previous. He continues to alternate using clobetasol along with Santyl at this point. He is obviously somewhat frustrated. 12/22/17 on evaluation today patient appears to be doing a little worse compared to last evaluation. Unfortunately the wound is a little deeper and slightly larger than the last week's evaluation. With that being said he has made some progress in regard to the irritation surrounding at this time unfortunately despite that progress that's been made he still has a significant issue going on here. 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 For that reason I discussed with him today the possibility of perform a wound culture to ensure there's nothing overtly infected. 01/06/18 on evaluation today patient's wound is larger and deeper than previously evaluated. With that being said it did appear that his wound was infected after my last evaluation with him. Subsequently I did end up prescribing a prescription for Bactrim DS which she has been taking and having no complication with. Fortunately there does not  appear to be any evidence of Coultas, Abdulkarim ([id number]) infection at this point in time as far as anything spreading, no want to touch, and overall I feel like things are showing signs of improvement. 01/13/18 on evaluation today patient appears to be even a little larger and deeper than last time. There still muscle exposed in the base of the wound. Nonetheless he does appear to be less erythematous I do believe inflammation is calming down also believe the infection looks like it's probably resolved at this time based on what I'm seeing. No fevers, chills, nausea, or vomiting noted at this time. 01/30/18 on evaluation today patient actually appears to visually look better for the most part. Unfortunately those visually this looks better he does seem to potentially have what may be an abscess in the muscle that has been noted in the central portion of the wound. This is the first time that I have noted what appears to be fluctuance in the central portion of the muscle. With that being said I'm somewhat more concerned about the fact that this might indicate an abscess formation at this location. I do believe that an ultrasound would be appropriate. This is likely something we need to try to do as soon as possible. He has been switch to mupirocin ointment and he is no longer using the steroid ointment as prescribed by dermatology he sees them again next week he's been decreased from 60 to 40 mg of prednisone. 03/09/18 on evaluation today patient actually appears to be doing a little better compared to last time I saw him. There's not as much erythema surrounding the wound itself. He I did review his most recent infectious disease note which was dated 02/24/18. He saw Dr. Michel Bickers in [place]. 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 Patient is now off of antibiotics and they are continually observing this.  There seems to be no change in the past two weeks in my pinion based on what the patient says and what I see today compared to what Dr. Megan Salon likely saw two weeks ago. No fevers, chills, nausea, or vomiting noted at this time. 03/23/18 on evaluation today patient's wound actually appears to be showing signs of improvement  which is good news. He is currently still on the Dapsone. He is also working on tapering the prednisone to get off of this and Dr. Lottie Rater is working with him in this regard. Nonetheless overall I feel like the wound is doing well it does appear based on the infectious disease note that I reviewed from Dr. 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 I did also review the results of his ultrasound of left lower extremity which revealed there was a dentist tissue in the base of the wound without an abscess noted. Electronic Signature(s) Signed: 03/24/2018 11:39:33 AM By: Worthy Keeler PA-C Entered By: Worthy Keeler on 03/23/2018 12:45:56 TAMOTSU, WIEDERHOLT ([id number]) -------------------------------------------------------------------------------- Physical Exam Details Patient Name: [name] Date of Service: 03/23/2018 8:00 AM Medical Record Number: [id number] Patient Account Number: [id number] Date of Birth/Sex: 07-29-1980 (37 y.o. M) Treating RN: [name] Primary Care Provider: [name] Other Clinician: Referring Provider: [name] Treating Provider/Extender: Melburn Hake, Sylvana Bonk Weeks in Treatment: 6 Constitutional Well-nourished and well-hydrated in no acute distress. Respiratory normal breathing without difficulty. Psychiatric this patient is able to make decisions and demonstrates good insight into disease process. Alert and Oriented x 3. pleasant and cooperative. Notes Patient's wound bed currently did have a significant amount of  slough buildup on the surface of the wound. I was able to selectively debride away using saline, gauze, and they sterile Q-tip today a significant amount of the sloppy buildup on the surface of the wound. Post debridement the wound bed appears to be doing significantly better. With that being said I think that the patient should be likely cleaning the area which I discussed with him more in the past as well more aggressively with galls at home but it doesn't appear that he does as much of this in my pinion. He also states he's run out of the Santyl at this point. Electronic Signature(s) Signed: 03/24/2018 11:39:33 AM By: Worthy Keeler PA-C Entered By: Worthy Keeler on 03/23/2018 12:46:52 [name] ([id number]) -------------------------------------------------------------------------------- Physician Orders Details Patient Name: [name] Date of Service: 03/23/2018 8:00 AM Medical Record Number: [id number] Patient Account Number: [id number] Date of Birth/Sex: [birth year] (37 y.o. 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 Anesthetic (add to Medication List) Wound #1 Left,Lateral Lower Leg o Topical Lidocaine 4% cream applied to wound bed prior to debridement (In Clinic Only). Primary Wound Dressing Wound #1 Left,Lateral Lower Leg o Santyl Ointment Secondary Dressing Wound #1 Left,Lateral Lower Leg o Other - Gauze and xtrasorb Dressing Change Frequency Wound #1 Left,Lateral Lower Leg o  Change dressing every day. Follow-up Appointments Wound #1 Left,Lateral Lower Leg o Return Appointment in 2 weeks. Edema Control Wound #1 Left,Lateral Lower Leg o Patient to wear own compression stockings o Elevate legs to the level of the heart and pump ankles as often as possible Medications-please add to medication list. Wound #1 Left,Lateral Lower Leg o Santyl Enzymatic Ointment o Other: - Continue steroids Oley, Merrit ([id number]) Patient Medications Allergies: milk,  Biaxin, seasonal Notifications Medication Indication Start End Santyl 03/23/2018 DOSE topical 250 unit/gram ointment - ointment topical applied nickel thick to the wound bed and then cover with a dressing as directed Electronic Signature(s) Signed: 03/23/2018 9:20:51 AM By: Worthy Keeler PA-C Entered By: Worthy Keeler on 03/23/2018 09:20:50 Moman, [name] ([id number]) -------------------------------------------------------------------------------- Problem List Details Patient Name: [name] Date of Service: 03/23/2018 8:00 AM Medical Record Number: [id number] Patient Account Number: [id number] Date of Birth/Sex: 1981/04/03 (37 y.o. M) Treating RN: [name] Primary Care Provider: [name] Other Clinician: Referring Provider: [name] Treating Provider/Extender: Melburn Hake, Brittan Mapel Weeks in Treatment: 93 Active Problems ICD-10 Evaluated Encounter Code Description Active Date Today Diagnosis L97.222 Non-pressure chronic ulcer of left calf with fat layer exposed 12/04/2016 No Yes L88 Pyoderma gangrenosum 03/26/2017 No Yes I87.2 Venous insufficiency (chronic) (peripheral) 12/04/2016 No Yes Inactive Problems ICD-10 Code Description Active Date Inactive Date L97.213 Non-pressure chronic ulcer of right calf with necrosis of muscle 04/02/2017 04/02/2017 Resolved Problems Electronic Signature(s) Signed: 03/24/2018 11:39:33 AM By: Worthy Keeler PA-C Entered By: Worthy Keeler on  03/23/2018 08:13:38 [name] ([id number]) -------------------------------------------------------------------------------- Progress Note Details Patient Name: [name] Date of Service: 03/23/2018 8:00 AM Medical Record Number: [id number] Patient Account Number: [id number] Date of Birth/Sex: [birth year] (37 y.o. M) Treating RN: [name] Primary Care Provider: [name] Other Clinician: Referring Provider: [name] Treating Provider/Extender: Melburn Hake, Timira Bieda Weeks in Treatment: 75 Subjective Chief Complaint Information obtained from Patient He is here in follow up evaluation for LLE pyoderma ulcer History of Present Illness (HPI) 12/04/16; 37 year old man who comes into the clinic today for review of a wound on the posterior left calf. He tells me that is been there for about a year. He is not a diabetic he does smoke half a pack per day. He was seen in the ER on 11/20/16 felt to have cellulitis around the wound and was given clindamycin. An x-ray did not show osteomyelitis. The patient initially tells me that he has a milk allergy that sets off a pruritic itching rash on his lower legs which she scratches incessantly and he thinks that's what may have set up the wound. He has been using various topical antibiotics and ointments without any effect. He works in a [employer] and is on his feet all day. He does not have a prior history of wounds however he does have the rash on both lower legs the right arm and the ventral aspect of his left arm. These are excoriations and clearly have had scratching however there are of macular looking areas on both legs including a substantial larger area on the right leg. This does not have an underlying open area. There is no blistering. The patient tells me that 2 years ago in [place] in response to the rash on his legs he saw a dermatologist who told him he had a condition which may be pyoderma gangrenosum although I may be putting  words into his mouth. He seemed to recognize this. 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 He is not in any treatment for this. He's had no recent travel 12/11/16; the patient arrives today with his wound and roughly the same condition we've been using silver alginate this is a deep punched out wound with some surrounding erythema but no tenderness. Biopsy I did did not show confirmed pyoderma gangrenosum suggested nonspecific inflammation and vasculitis but does not provide an actual description of what was  seen by the pathologist. I'm really not able to understand this We have also received information from the patient's dermatologist in [place] notes from April 2016. This was a doctor Agarwal- antal. 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 He was prescribed topical steroid high potency under occlusion which helped but at this point the patient did not have a deep punched out wound. 12/18/16; the patient's wound is larger in terms of surface area however this surface looks better and there is less depth. The surrounding erythema also is better. The patient states that the wrap we put on came off 2 days ago when he has been using his compression stockings. He we are in the process of getting a dermatology consult. 12/26/16 on evaluation today patient's left lower extremity wound shows evidence of infection with surrounding erythema noted. He has been tolerating the dressing changes but states that he has noted more discomfort. There is a larger area of erythema surrounding the wound. No fevers, chills, nausea, or vomiting noted at this time. With that being said the wound still does have slough covering the surface. He is not allergic to any medication that he is aware of at this point. In regard to his right lower extremity he had several regions that are erythematous and pruritic he wonders if there's anything we can do to  help that. 01/02/17 I reviewed patient's wound culture which was obtained his visit last week. He was placed on doxycycline at that point. Unfortunately that does not appear to be an antibiotic that would likely help with the situation however the pseudomonas noted on culture is sensitive to Cipro. Also unfortunately patient's wound seems to have a large compared to last week's evaluation. Not severely so but there are definitely increased measurements in general. He is continuing to have discomfort as well he writes this to be a seven out of 10. In fact he would prefer me not to perform any debridement today due to the fact that he is having discomfort and considering he has an active infection on the little reluctant to do so anyway. No fevers, Minshall, Kamaury ([id number]) chills, nausea, or vomiting noted at this time. 01/08/17; patient seems dermatology on September 5. I suspect dermatology will want the slides from the biopsy I did sent to their pathologist. I'm not sure if there is a way we can expedite that. In any case the culture I did before I left on vacation 3 weeks ago showed Pseudomonas he was given 10 days of Cipro and per her description of her intake nurses is actually somewhat better this week although the wound is quite a bit bigger than I remember the last time I saw this. He still has 3 more days of Cipro 01/21/17; dermatology appointment tomorrow. He has completed the ciprofloxacin for Pseudomonas. Surface of the wound looks better however he is had some deterioration in the lesions on his right leg. Meantime the left lateral leg wound we will continue with sample 01/29/17; patient had his dermatology appointment but I can't yet see that note. He is completed his antibiotics. The wound is more superficial but considerably larger in circumferential area than when he came in. This is in his left lateral calf. He also has swollen erythematous areas with superficial wounds on the right  leg and small papular areas on both arms. There apparently areas in her his upper thighs and buttocks I did not look at those. Dermatology biopsied the right leg. Hopefully will have their input  next week. 02/05/17; patient went back to see his dermatologist who told him that he had a "scratching problem" as well as staph. He is now on a 30 day course of doxycycline and I believe she gave him triamcinolone cream to the right leg areas to help with the itching [not exactly sure but probably triamcinolone]. She apparently looked at the left lateral leg wound although this was not rebiopsied and I think felt to be ultimately part of the same pathogenesis. He is using sample border foam and changing nevus himself. He now has a new open area on the right posterior leg which was his biopsy site I don't have any of the dermatology notes 02/12/17; we put the patient in compression last week with SANTYL to the wound on the left leg and the biopsy. Edema is much better and the depth of the wound is now at level of skin. Area is still the same Biopsy site on the right lateral leg we've also been using santyl with a border foam dressing and he is changing this himself. 02/19/17; Using silver alginate started last week to both the substantial left leg wound and the biopsy site on the right wound. He is tolerating compression well. Has a an appointment with his primary M.D. tomorrow wondering about diuretics although I'm wondering if the edema problem is actually lymphedema 02/26/17; the patient has been to see his primary doctor Dr. Jerrel Ivory at [place] our primary care. She started him on Lasix 20 mg and this seems to have helped with the edema. However we are not making substantial change with the left lateral calf wound and inflammation. The biopsy site on the right leg also looks stable but not really all that different. 03/12/17; the patient has been to see vein and vascular Dr. Lucky Cowboy. He has had venous  reflux studies I have not reviewed these. I did get a call from his dermatology office. 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 They therefore recommended clobetasol and tetracycline i.e. 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 03/26/17; apparently the patient just had reflux studies not an appointment with Dr. dew. She arrives in clinic today having applied clobetasol for 2-3 weeks. He notes over the last 2-3 days excessive drainage having to change the dressing 3-4 times a day and also expanding erythema. 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 Lillia Carmel at [facility] dermatology locally who kindly saw him at my request. 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 My concern was the degree of erythema medially extending from his left leg wound which was either inflammation from pyoderma or cellulitis. I put him on Augmentin however culture of the wound showed Pseudomonas which is quinolone sensitive. I really don't believe he has cellulitis however in view of everything I will continue and give him a course of Cipro. He is also on doxycycline as an immune modulator for the pyoderma. In addition to  his original wound on the left lateral leg with surrounding erythema he has a wound on the right posterior calf which was an original biopsy site done by dermatology. This was felt to represent pathergy from  pyoderma gangrenosum 04/16/17; pyoderma gangrenosum. Saw Dr. Lillia Carmel yesterday. He has been using topical antibiotics to both wound areas his original wound on the left and the biopsies/pathergy area on the right. There is definitely some improvement in the inflammation around the wound on the right although the patient states he has increasing sensitivity of the wounds. He is on prednisone 60 and doxycycline 1 as prescribed by Dr. Lillia Carmel. He is covering the topical antibiotic with gauze and putting this in his own compression stocks and changing this daily. He states that Dr. Lottie Rater did a culture of the left leg wound yesterday 05/07/17; pyoderma gangrenosum. The patient saw Dr. Lillia Carmel yesterday and has a follow-up with her in one month. He is still using topical antibiotics to both wounds although he can't recall exactly what type. He is still on prednisone 60 mg. Dr. Lillia Carmel stated that the doxycycline could stop if we were in agreement. 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 The right medial leg was induced by [name] ([id number]) biopsy/pathergy. The area on the right is essentially healed. 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 The right medial leg has closed and remains closed. He is still on prednisone 60. He tells me he missed his last dermatology appointment with Dr. Lillia Carmel but will make another appointment. He reports that her blood sugar at a recent screen in [place] was high 200's. He was 180 today. 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 I think he is going to require still much longer prednisone perhaps another 3 months before attempting to taper. In the meantime his wound is a lot better. Smaller. He is cleaning this off daily and applying  topical antibiotics. When he was last in the clinic I thought about changing to [facility] and actually put in a couple of calls to dermatology although probably not during their business hours. 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 He continues on oral prednisone. He has been using triple antibiotic ointment. There is surface debris and we will transition to [facility] and have him return in 2 weeks. He has lost 30 pounds since his last appointment with lifestyle modification. He may benefit from topical steroid cream for treatment this can be considered at a later date. 08/22/17 on evaluation today patient appears to actually be doing rather well in regard to his left lateral lower extremity ulcer. He has actually been managed by Dr. Dellia Nims most recently. Patient is currently on oral steroids at this time. This seems to have been of benefit for him. Nonetheless his last visit was actually with Leah on 08/06/17. Currently he is not utilizing any topical steroid creams although this could be of benefit as well. No fevers, chills, nausea, or vomiting noted at this time. 09/05/17 on evaluation today patient appears to be doing better in regard to his left lateral lower extremity ulcer. He has been tolerating the dressing changes without complication. He is using Santyl with good  effect. Overall I'm very pleased with how things are standing at this point. Patient likewise is happy that this is doing better. 09/19/17 on evaluation today patient actually appears to be doing rather well in regard to his left lateral lower extremity ulcer. 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 He's not having any significant pain. 10/03/17 on evaluation today patient appears to be doing excellent in regard to his lower  extremity wound on the left secondary to Pyoderma gangrenosum. He has been tolerating the Santyl without complication and in general I feel like he's making good progress. 10/17/17 on evaluation today patient appears to be doing very well in regard to his left lateral lower surety ulcer. He has been tolerating the dressing changes without complication. There does not appear to be any evidence of infection he's alternating the Santyl and the triple antibiotic ointment every other day this seems to be doing well for him. 11/03/17 on evaluation today patient appears to be doing very well in regard to his left lateral lower extremity ulcer. He is been tolerating the dressing changes without complication which is good news. Fortunately there does not appear to be any evidence of infection which is also great news. Overall is doing excellent they are starting to taper down on the prednisone is down to 40 mg at this point it also started topical clobetasol for him. 11/17/17 on evaluation today patient appears to be doing well in regard to his left lateral lower surety ulcer. He's been tolerating the dressing changes without complication. He does note that he is having no pain, no excessive drainage or discharge, and overall he feels like things are going about how he would expect and hope they would. Overall he seems to have no evidence of infection at this time in my opinion which is good news. 12/04/17-He is seen in follow-up evaluation for right lateral lower extremity ulcer. He has been applying topical steroid cream. Today's measurement show slight increase in size. Over the next 2 weeks we will transition to every other day Santyl and steroid cream. He has been encouraged to monitor for changes and notify clinic with any concerns 12/15/17 on evaluation today patient's left lateral motion the ulcer and fortunately is doing worse again at this point. This just since last week to this week has close to  doubled in size according to the patient. I did not seeing last week's I do not have a visual to compare this to in our system was also down so we do not have all the charts and at this point. Nonetheless it does have me somewhat concerned in regard to the fact that again he was worried enough about it he has contact the dermatology that placed them back on the full strength, 50 mg a day of the prednisone that he was taken previous. He continues to alternate using clobetasol along with Santyl at this point. He is obviously somewhat frustrated. 12/22/17 on evaluation today patient appears to be doing a little worse compared to last evaluation. Unfortunately the wound is a little deeper and slightly larger than the last week's evaluation. With that being said he has made some progress in regard to the irritation surrounding at this time unfortunately despite that progress that's been made he still has a significant issue going on here. I'm not certain that he is having really any true infection at this time although with the Pyoderma gangrenosum  it can Laubscher, Arrion ([id number]) sometimes be difficult to differentiate infection versus just inflammation. For that reason I discussed with him today the possibility of perform a wound culture to ensure there's nothing overtly infected. 01/06/18 on evaluation today patient's wound is larger and deeper than previously evaluated. With that being said it did appear that his wound was infected after my last evaluation with him. Subsequently I did end up prescribing a prescription for Bactrim DS which she has been taking and having no complication with. Fortunately there does not appear to be any evidence of infection at this point in time as far as anything spreading, no want to touch, and overall I feel like things are showing signs of improvement. 01/13/18 on evaluation today patient appears to be even a little larger and deeper than last time. There still muscle  exposed in the base of the wound. Nonetheless he does appear to be less erythematous I do believe inflammation is calming down also believe the infection looks like it's probably resolved at this time based on what I'm seeing. No fevers, chills, nausea, or vomiting noted at this time. 01/30/18 on evaluation today patient actually appears to visually look better for the most part. Unfortunately those visually this looks better he does seem to potentially have what may be an abscess in the muscle that has been noted in the central portion of the wound. This is the first time that I have noted what appears to be fluctuance in the central portion of the muscle. With that being said I'm somewhat more concerned about the fact that this might indicate an abscess formation at this location. I do believe that an ultrasound would be appropriate. This is likely something we need to try to do as soon as possible. He has been switch to mupirocin ointment and he is no longer using the steroid ointment as prescribed by dermatology he sees them again next week he's been decreased from 60 to 40 mg of prednisone. 03/09/18 on evaluation today patient actually appears to be doing a little better compared to last time I saw him. There's not as much erythema surrounding the wound itself. He I did review his most recent infectious disease note which was dated 02/24/18. He saw Dr. Michel Bickers in [place]. 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 Patient is now off of antibiotics and they are continually observing this. There seems to be no change in the past two weeks in my pinion based on what the patient says and what I see today compared to what Dr. Megan Salon likely saw two weeks ago. No fevers, chills, nausea, or vomiting noted at this time. 03/23/18 on evaluation today patient's wound actually appears to be showing signs of  improvement which is good news. He is currently still on the Dapsone. He is also working on tapering the prednisone to get off of this and Dr. Lottie Rater is working with him in this regard. Nonetheless overall I feel like the wound is doing well it does appear based on the infectious disease note that I reviewed from Dr. 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 I did also review the results of his ultrasound of left lower extremity which revealed there was a dentist tissue in the base of the wound without  an abscess noted. Patient History Information obtained from Patient. Family History Diabetes - Mother,Maternal Grandparents, Heart Disease - Mother,Maternal Grandparents, Hereditary Spherocytosis - Siblings, Hypertension - Maternal Grandparents, Stroke - Siblings, No family history of Cancer, Kidney Disease, Lung Disease, Seizures, Thyroid Problems, Tuberculosis. Social History Current some day smoker, Marital Status - Married, Alcohol Use - Rarely, Drug Use - No History, Caffeine Use - Moderate. Review of Systems (ROS) Constitutional Symptoms (General Health) Denies complaints or symptoms of Fever, Chills. Respiratory The patient has no complaints or symptoms. Cardiovascular The patient has no complaints or symptoms. Psychiatric The patient has no complaints or symptoms. KYNAN, PEASLEY ([id number]) Objective Constitutional Well-nourished and well-hydrated in no acute distress. Vitals Time Taken: 8:00 AM, Height: 71 in, Weight: 338 lbs, BMI: 47.1, Temperature: 98.2 F, Pulse: 90 bpm, Respiratory Rate: 16 breaths/min, Blood Pressure: 144/87 mmHg. Respiratory normal breathing without difficulty. Psychiatric this patient is able to make decisions and demonstrates good insight into disease process. Alert and Oriented x 3. pleasant and cooperative. General Notes: Patient's  wound bed currently did have a significant amount of slough buildup on the surface of the wound. I was able to selectively debride away using saline, gauze, and they sterile Q-tip today a significant amount of the sloppy buildup on the surface of the wound. Post debridement the wound bed appears to be doing significantly better. With that being said I think that the patient should be likely cleaning the area which I discussed with him more in the past as well more aggressively with galls at home but it doesn't appear that he does as much of this in my pinion. He also states he's run out of the Santyl at this point. Integumentary (Hair, Skin) Wound #1 status is Open. Original cause of wound was Gradually Appeared. The wound is located on the Left,Lateral Lower Leg. The wound measures 3.5cm length x 4.5cm width x 0.5cm depth; 12.37cm^2 area and 6.185cm^3 volume. There is muscle and Fat Layer (Subcutaneous Tissue) Exposed exposed. There is no tunneling or undermining noted. There is a large amount of serous drainage noted. The wound margin is distinct with the outline attached to the wound base. There is small (1-33%) red, pink granulation within the wound bed. There is a large (67-100%) amount of necrotic tissue within the wound bed including Adherent Slough. The periwound skin appearance exhibited: Induration, Erythema. The periwound skin appearance did not exhibit: Callus, Crepitus, Excoriation, Rash, Scarring, Dry/Scaly, Maceration, Atrophie Blanche, Cyanosis, Ecchymosis, Hemosiderin Staining, Mottled, Pallor, Rubor. The surrounding wound skin color is noted with erythema which is circumferential. Periwound temperature was noted as No Abnormality. The periwound has tenderness on palpation. 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 There was a Selective/Open Wound Non-Viable Tissue Debridement with a total area of 15.75 sq cm performed by STONE III, Kenly Xiao E., PA-C. With the following instrument(s): gauze, cotton tipped applicator, saline Material removed includes Slough after achieving pain control using Lidocaine. No specimens were taken. A time out was conducted at 08:19, prior to the start of the procedure. A Minimum amount of bleeding was controlled with Pressure. The procedure was tolerated well with a pain level of 2 throughout and a pain level of 2 following the procedure. Post Debridement Measurements: 3.5cm length x 4.5cm width x 0.5cm depth; 6.185cm^3 volume. Character of Wound/Ulcer  Post Debridement is stable. Post procedure Diagnosis Wound #1: Same as Pre-Procedure Plan Wound Cleansing: Wound #1 Left,Lateral Lower Leg: May Shower, gently pat wound dry prior to applying new dressing. Anesthetic (add to Medication List): Wound #1 Left,Lateral Lower Leg: Topical Lidocaine 4% cream applied to wound bed prior to debridement (In Clinic Only). Primary Wound Dressing: Wound #1 Left,Lateral Lower Leg: Santyl Ointment Secondary Dressing: Wound #1 Left,Lateral Lower Leg: Other - Gauze and xtrasorb Dressing Change Frequency: Wound #1 Left,Lateral Lower Leg: Change dressing every day. Follow-up Appointments: Wound #1 Left,Lateral Lower Leg: Return Appointment in 2 weeks. Edema Control: Wound #1 Left,Lateral Lower Leg: Patient to wear own compression stockings Elevate legs to the level of the heart and pump ankles as often as possible Medications-please add to medication list.: Wound #1 Left,Lateral Lower Leg: Santyl Enzymatic Ointment Other: - Continue steroids The following medication(s) was prescribed: Santyl topical 250 unit/gram ointment ointment topical applied nickel thick to the wound bed and then cover with a  dressing as directed starting 03/23/2018 I am going to suggest at this time that we go ahead and continue with sentiments I feel like this is the best thing for him. He is in agreement with plan. I did send a refill of the Santyl to the pharmacy for him. Subsequently will see him back for reevaluation two weeks to see were things stand. If anything changes or worsens meantime the patient will contact the office and let me know. OATHER, MUILENBURG ([id number]) Please see above for specific wound care orders. We will see patient for re-evaluation in 2 week(s) here in the clinic. If anything worsens or changes patient will contact our office for additional recommendations. Electronic Signature(s) Signed: 03/24/2018 11:39:33 AM By: Worthy Keeler PA-C Entered By: Worthy Keeler on 03/23/2018 12:48:47 EITHAN, BEAGLE ([id number]) -------------------------------------------------------------------------------- ROS/PFSH Details Patient Name: [name] Date of Service: 03/23/2018 8:00 AM Medical Record Number: [id number] Patient Account Number: [id number] Date of Birth/Sex: 09-Mar-1981 (37 y.o. M) Treating RN: [name] Primary Care Provider: [name] Other Clinician: Referring Provider: [name] Treating Provider/Extender: Melburn Hake, Davonn Flanery Weeks in Treatment: 45 Information Obtained From Patient Wound History Do you currently have one or more open woundso Yes How many open wounds do you currently haveo 1 Approximately how long have you had your woundso 1 year How have you been treating your wound(s) until nowo neosporin Has your wound(s) ever healed and then re-openedo No Have you had any lab work done in the past montho No Have you tested positive for an antibiotic resistant organism (MRSA, VRE)o No Have you tested positive for osteomyelitis (bone infection)o No Have you had any tests for circulation on your legso No Have you had other problems associated with your woundso  Infection, Swelling Constitutional Symptoms (General Health) Complaints and Symptoms: Negative for: Fever; Chills Eyes Medical History: Negative for: Cataracts; Glaucoma; Optic Neuritis Ear/Nose/Mouth/Throat Medical History: Negative for: Chronic sinus problems/congestion; Middle ear problems Hematologic/Lymphatic Medical History: Negative for: Anemia; Hemophilia; Human Immunodeficiency Virus; Lymphedema; Sickle Cell Disease Respiratory Complaints and Symptoms: No Complaints or Symptoms Medical History: Positive for: Sleep Apnea - cpap Negative for: Aspiration; Asthma; Chronic Obstructive Pulmonary Disease (COPD); Pneumothorax Cardiovascular Complaints and Symptoms: No Complaints or Symptoms Medical History: Positive for: Hypertension Supple, Azlaan ([id number]) Negative for: Angina; Arrhythmia; Congestive Heart Failure; Coronary Artery Disease; Deep Vein Thrombosis; Hypotension; Myocardial Infarction; Peripheral Arterial Disease; Peripheral Venous Disease; Phlebitis; Vasculitis Gastrointestinal Medical History: Positive for: Colitis Endocrine Medical History: Negative for: Type I Diabetes; Type II Diabetes  Genitourinary Medical History: Negative for: End Stage Renal Disease Immunological Medical History: Negative for: Lupus Erythematosus; Raynaudos; Scleroderma Integumentary (Skin) Medical History: Negative for: History of Burn; History of pressure wounds Musculoskeletal Medical History: Negative for: Gout; Rheumatoid Arthritis; Osteoarthritis; Osteomyelitis Neurologic Medical History: Negative for: Dementia; Neuropathy; Quadriplegia; Paraplegia; Seizure Disorder Oncologic Medical History: Negative for: Received Chemotherapy; Received Radiation Psychiatric Complaints and Symptoms: No Complaints or Symptoms Medical History: Negative for: Anorexia/bulimia; Confinement Anxiety Immunizations Pneumococcal Vaccine: Received Pneumococcal Vaccination: No Implantable  Devices Family and Social History HARTLEY, URTON ([id number]) Cancer: No; Diabetes: Yes - Mother,Maternal Grandparents; Heart Disease: Yes - Mother,Maternal Grandparents; Hereditary Spherocytosis: Yes - Siblings; Hypertension: Yes - Maternal Grandparents; Kidney Disease: No; Lung Disease: No; Seizures: No; Stroke: Yes - Siblings; Thyroid Problems: No; Tuberculosis: No; Current some day smoker; Marital Status - Married; Alcohol Use: Rarely; Drug Use: No History; Caffeine Use: Moderate; Financial Concerns: No; Food, Clothing or Shelter Needs: No; Support System Lacking: No; Transportation Concerns: No; Advanced Directives: No; Patient does not want information on Advanced Directives; Living Will: No Physician Affirmation I have reviewed and agree with the above information. Electronic Signature(s) Signed: 03/24/2018 11:39:33 AM By: Worthy Keeler PA-C Signed: 03/26/2018 10:02:59 AM By: [name], [name], RN, [name], [name], [name] Entered By: Worthy Keeler on 03/23/2018 12:46:14 EDGER, HUSAIN ([id number]) -------------------------------------------------------------------------------- SuperBill Details Patient Name: [name] Date of Service: 03/23/2018 Medical Record Number: [id number] Patient Account Number: [id number] Date of Birth/Sex: Sep 26, 1980 (37 y.o. M) Treating RN: [name] Primary Care Provider: [name] Other Clinician: Referring Provider: [name] Treating Provider/Extender: Melburn Hake, Dayron Odland Weeks in Treatment: 23 Diagnosis Coding ICD-10 Codes Code Description [phone] Non-pressure chronic ulcer of left calf with fat layer exposed L88 Pyoderma gangrenosum I87.2 Venous insufficiency (chronic) (peripheral) Facility Procedures CPT4 Code: 54982641 Description: 58309 - DEBRIDE WOUND 1ST 20 SQ CM OR < ICD-10 Diagnosis Description L97.222 Non-pressure chronic ulcer of left calf with fat layer expos Modifier: ed Quantity: 1 Physician Procedures CPT4 Code:  4076808 Description: 81103 - WC PHYS LEVEL 4 - EST PT ICD-10 Diagnosis Description L97.222 Non-pressure chronic ulcer of left calf with fat layer expose L88 Pyoderma gangrenosum I87.2 Venous insufficiency (chronic) (peripheral) Modifier: 25 d Quantity: 1 CPT4 Code: [id number] Description: 92924 - WC PHYS DEBR WO ANESTH 20 SQ CM ICD-10 Diagnosis Description L97.222 Non-pressure chronic ulcer of left calf with fat layer expose Modifier: d Quantity: 1 Electronic Signature(s) Signed: 03/24/2018 11:39:33 AM By: Worthy Keeler PA-C Entered By: Worthy Keeler on 03/23/2018 12:49:06

## 2018-03-28 NOTE — Progress Notes (Signed)
DETRON, CARRAS ([id number]) Visit Report for 03/23/2018 Arrival Information Details Patient Name: [name], [name] Date of Service: 03/23/2018 8:00 AM Medical Record Number: [id number] Patient Account Number: [id number] Date of Birth/Sex: 01/22/1981 (37 y.o. M) Treating RN: [name] Primary Care Tou Hayner: [name] Other Clinician: Referring Akansha Wyche: [name] Treating Vonnie Spagnolo/Extender: Melburn Hake, [name]: 28 Visit Information History Since Last Visit Added or deleted any medications: No Patient Arrived: Ambulatory Any new allergies or adverse reactions: No Arrival Time: 08:02 Had a fall or experienced change in No Accompanied By: self activities of daily living that may affect Transfer Assistance: None risk of falls: Patient Identification Verified: Yes Signs or symptoms of abuse/neglect since last visito No Secondary Verification Process Completed: Yes Hospitalized since last visit: No Patient Requires Transmission-Based No Implantable device outside of the clinic excluding No Precautions: cellular tissue based products placed in the center Patient Has Alerts: No since last visit: Has Dressing in Place as Prescribed: Yes Pain Present Now: No Electronic Signature(s) Signed: 03/23/2018 2:08:51 PM By: Lorine Bears RCP, RRT, CHT Entered By: Lorine Bears on 03/23/2018 08:03:02 [name] ([id number]) -------------------------------------------------------------------------------- Encounter Discharge Information Details Patient Name: [name] Date of Service: 03/23/2018 8:00 AM Medical Record Number: [id number] Patient Account Number: [id number] Date of Birth/Sex: 1980-09-21 (37 y.o. M) Treating RN: [name] Primary Care Sheela Mcculley: [name] Other Clinician: Referring Frederik Standley: [name] Treating Celines Femia/Extender: Melburn Hake, [name]: 10 Encounter Discharge Information Items Post  Procedure Vitals Discharge Condition: Stable Temperature (F): 98.2 Ambulatory Status: Ambulatory Pulse (bpm): 90 Discharge Destination: Home Respiratory Rate (breaths/min): 16 Transportation: Private Auto Blood Pressure (mmHg): 144/87 Accompanied By: self Schedule Follow-up Appointment: Yes Clinical Summary of Care: Electronic Signature(s) Signed: 03/26/2018 10:02:59 AM By: [name], [name], RN, [name], [name], [name] Entered By: [name], [name], RN, [name], [name] on 03/23/2018 08:31:39 [name] ([id number]) -------------------------------------------------------------------------------- Lower Extremity Assessment Details Patient Name: [name] Date of Service: 03/23/2018 8:00 AM Medical Record Number: [id number] Patient Account Number: [id number] Date of Birth/Sex: [birth year] (37 y.o. M) Treating RN: [name] Primary Care Nidal Rivet: [name] Other Clinician: Referring Izzac Rockett: [name] Treating Patte Winkel/Extender: Melburn Hake, [name]: 18 Edema Assessment Assessed: [Left: No] [Right: No] [Left: Edema] [Right: :] Calf Left: Right: Point of Measurement: 30 cm From Medial Instep 50 cm cm Ankle Left: Right: Point of Measurement: 12 cm From Medial Instep 30 cm cm Vascular Assessment Claudication: Claudication Assessment [Left:None] Pulses: Dorsalis Pedis Palpable: [Left:Yes] Posterior Tibial Extremity colors, hair growth, and conditions: Extremity Color: [Left:Red] Hair Growth on Extremity: [Left:Yes] Temperature of Extremity: [Left:Warm] Capillary Refill: [Left:< 3 seconds] Toe Nail Assessment Left: Right: Thick: No Discolored: No Deformed: No Improper Length and Hygiene: No Electronic Signature(s) Signed: 03/26/2018 10:02:59 AM By: [name], [name], RN, [name], [name], [name] Entered By: [name], [name], RN, [name], [name] on 03/23/2018 08:12:37 [name] ([id number]) -------------------------------------------------------------------------------- Multi Wound Chart  Details Patient Name: [name] Date of Service: 03/23/2018 8:00 AM Medical Record Number: [id number] Patient Account Number: [id number] Date of Birth/Sex: [birth year] (37 y.o. 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] [N/A:N/A] Comorbid History: [1:Sleep Apnea, Hypertension, Colitis] [N/A:N/A] Date Acquired: [1:11/18/2015] [N/A:N/A]  Weeks of Treatment: [1:67] [N/A:N/A] Wound Status: [1:Open] [N/A:N/A] Measurements L x W x D [1:3.5x4.5x0.5] [N/A:N/A] (cm) Area (cm) : [1:12.37] [N/A:N/A] Volume (cm) : [1:6.185] [N/A:N/A] % Reduction in Area: [1:-152.00%] [N/A:N/A] % Reduction in Volume: [1:-57.50%] [N/A:N/A] Classification: [1:Full Thickness With Exposed Support Structures] [N/A:N/A] Exudate Amount: [1:Large] [N/A:N/A] Exudate Type: [1:Serous] [N/A:N/A] Exudate Color: [1:amber] [N/A:N/A] Wound Margin: [1:Distinct, outline attached] [N/A:N/A] Granulation Amount: [1:Small (1-33%)] [N/A:N/A] Granulation Quality: [1:Red, Pink] [N/A:N/A] Necrotic Amount: [1:Large (67-100%)] [N/A:N/A] Exposed Structures: [1:Fat Layer (Subcutaneous Tissue) Exposed: Yes Muscle: Yes Fascia: No Tendon: No Joint: No Bone: No] [N/A:N/A] Epithelialization: [1:Small (1-33%)] [N/A:N/A] Periwound Skin Texture: [1:Induration: Yes Excoriation: No Callus: No Crepitus: No] [N/A:N/A] Rash: No Scarring: No Periwound Skin Moisture: Maceration: No N/A N/A Dry/Scaly: No Periwound Skin Color: Erythema: Yes N/A N/A Atrophie  Blanche: No Cyanosis: No Ecchymosis: No Hemosiderin Staining: No Mottled: No Pallor: No Rubor: No Erythema Location: Circumferential N/A N/A Temperature: No Abnormality N/A N/A Tenderness on Palpation: Yes N/A N/A Wound Preparation: Ulcer Cleansing: N/A N/A Rinsed/Irrigated with Saline Topical Anesthetic Applied: Other: lidocaine 4% Treatment Notes Electronic Signature(s) Signed: 03/26/2018 10:02:59 AM By: [name], [name], RN, [name], [name], [name] Entered By: [name], [name], RN, [name], [name] on 03/23/2018 08:19:04 [name], [name] ([id number]) -------------------------------------------------------------------------------- Multi-Disciplinary Care Plan Details Patient Name: [name] Date of Service: 03/23/2018 8:00 AM Medical Record Number: [id number] Patient Account Number: [id number] Date of Birth/Sex: January 12, 1981 (37 y.o. M) Treating RN: [name] Primary Care Jaqua Ching: [name] Other Clinician: Referring Kenetra Hildenbrand: [name] Treating Hrithik Boschee/Extender: Melburn Hake, [name]: 20 Active Inactive ` Orientation to the Wound Care Program Nursing Diagnoses: Knowledge deficit related to the wound healing center program Goals: Patient/caregiver will verbalize understanding of the [facility] Date Initiated: 12/11/2016 Target Resolution Date: 03/13/2017 Goal Status: Active Interventions: Provide education on orientation to the wound center Notes: ` Venous Leg Ulcer Nursing Diagnoses: Knowledge deficit related to disease process and management Potential for venous Insuffiency (use before diagnosis confirmed) Goals: Non-invasive venous studies are completed as ordered Date Initiated: 12/11/2016 Target Resolution Date: 03/13/2017 Goal Status: Active Patient will maintain optimal edema control Date Initiated: 12/11/2016 Target Resolution Date: 03/13/2017 Goal Status: Active Patient/caregiver will verbalize understanding of disease process and  disease management Date Initiated: 12/11/2016 Target Resolution Date: 03/13/2017 Goal Status: Active Verify adequate tissue perfusion prior to therapeutic compression application Date Initiated: 12/11/2016 Target Resolution Date: 03/13/2017 Goal Status: Active Interventions: Assess peripheral edema status every visit. Compression as ordered Provide education on venous insufficiency Treatment Activities: ZAIDAN, KEEBLE ([id number]) Therapeutic compression applied : 12/11/2016 Notes: ` Wound/Skin Impairment Nursing Diagnoses: Impaired tissue integrity Knowledge deficit related to ulceration/compromised skin integrity Goals: Patient/caregiver will verbalize understanding of skin care regimen Date Initiated: 12/11/2016 Target Resolution Date: 03/13/2017 Goal Status: Active Ulcer/skin breakdown will have a volume reduction of 30% by week 4 Date Initiated: 12/11/2016 Target Resolution Date: 03/13/2017 Goal Status: Active Ulcer/skin breakdown will have a volume reduction of 50% by week 8 Date Initiated: 12/11/2016 Target Resolution Date: 03/13/2017 Goal Status: Active Ulcer/skin breakdown will have a volume reduction of 80% by week 12 Date Initiated: 12/11/2016 Target Resolution Date: 03/13/2017 Goal Status: Active Ulcer/skin breakdown will heal within 14 weeks Date Initiated: 12/11/2016 Target Resolution Date: 03/13/2017 Goal Status: Active Interventions: Assess patient/caregiver ability to obtain necessary supplies Assess patient/caregiver ability to perform ulcer/skin care regimen upon admission and as needed Assess ulceration(s) every visit Provide education on ulcer and skin care Treatment Activities: Skin care regimen initiated : 12/11/2016 Topical wound management  initiated : 12/11/2016 Notes: Electronic Signature(s) Signed: 03/26/2018 10:02:59 AM By: [name], [name], RN, [name], [name], [name] Entered By: [name], [name], RN, [name], [name] on 03/23/2018 08:18:47 [name], [name]  ([id number]) -------------------------------------------------------------------------------- Pain Assessment Details Patient Name: [name] Date of Service: 03/23/2018 8:00 AM Medical Record Number: [id number] Patient Account Number: [id number] Date of Birth/Sex: [birth year] (37 y.o. M) Treating RN: [name] Primary Care Fany Cavanaugh: [name] Other Clinician: Referring Averiana Clouatre: [name] Treating Rontae Inglett/Extender: Melburn Hake, [name]: 49 Active Problems Location of Pain Severity and Description of Pain Patient Has Paino Yes Site Locations Rate the pain. Current Pain Level: 0 Pain Management and Medication Current Pain Management: Electronic Signature(s) Signed: 03/23/2018 2:08:51 PM By: Lorine Bears RCP, RRT, CHT Signed: 03/26/2018 10:02:59 AM By: [name], [name], RN, [name], [name], [name] Entered By: Lorine Bears on 03/23/2018 08:03:23 [name], [name] ([id number]) -------------------------------------------------------------------------------- Patient/Caregiver Education Details Patient Name: [name] Date of Service: 03/23/2018 8:00 AM Medical Record Number: [id number] Patient Account Number: [id number] Date of Birth/Gender: [birth year] (37 y.o. M) Treating RN: [name] Primary Care Physician: [name] Other Clinician: Referring Physician: [name] Treating Physician/Extender: Sharalyn Ink in Treatment: 62 Education Assessment Education Provided To: Patient Education Topics Provided Wound Debridement: Handouts: Wound Debridement Methods: Demonstration, Explain/Verbal Responses: State content correctly Wound/Skin Impairment: Handouts: Caring for Your Ulcer Methods: Demonstration, Explain/Verbal Responses: State content correctly Electronic Signature(s) Signed: 03/26/2018 10:02:59 AM By: [name], [name], RN, [name], [name], [name] Entered By: [name], [name], RN, [name], [name] on 03/23/2018 08:30:45 [name]  ([id number]) -------------------------------------------------------------------------------- Wound Assessment Details Patient Name: [name] Date of Service: 03/23/2018 8:00 AM Medical Record Number: [id number] Patient Account Number: [id number] Date of Birth/Sex: 02-Mar-1981 (37 y.o. M) Treating RN: [name] Primary Care Lavida Patch: [name] Other Clinician: Referring Arnell Slivinski: [name] Treating Kdyn Vonbehren/Extender: Melburn Hake, [name]: 62 Wound Status Wound Number: 1 Primary Etiology: Pyoderma Wound Location: Left Lower Leg - Lateral Wound Status: Open Wounding Event: Gradually Appeared Comorbid History: Sleep Apnea, Hypertension, Colitis Date Acquired: 11/18/2015 Weeks Of Treatment: 67 Clustered Wound: No Photos Photo Uploaded By: Secundino Ginger on 03/23/2018 09:29:33 Wound Measurements Length: (cm) 3.5 Width: (cm) 4.5 Depth: (cm) 0.5 Area: (cm) 12.37 Volume: (cm) 6.185 % Reduction in Area: -152% % Reduction in Volume: -57.5% Epithelialization: Small (1-33%) Tunneling: No Undermining: No Wound Description Full Thickness With Exposed Support Foul Odor A Classification: Structures Slough/Fibr Wound Margin: Distinct, outline attached Exudate Large Amount: Exudate Type: Serous Exudate Color: amber fter Cleansing: No ino Yes Wound Bed Granulation Amount: Small (1-33%) Exposed Structure Granulation Quality: Red, Pink Fascia Exposed: No Necrotic Amount: Large (67-100%) Fat Layer (Subcutaneous Tissue) Exposed: Yes Necrotic Quality: Adherent Slough Tendon Exposed: No Muscle Exposed: Yes Necrosis of Muscle: No Joint Exposed: No [name], [name] ([id number]) Bone Exposed: No Periwound Skin Texture Texture Color No Abnormalities Noted: No No Abnormalities Noted: No Callus: No Atrophie Blanche: No Crepitus: No Cyanosis: No Excoriation: No Ecchymosis: No Induration: Yes Erythema: Yes Rash: No Erythema Location:  Circumferential Scarring: No Hemosiderin Staining: No Mottled: No Moisture Pallor: No No Abnormalities Noted: No Rubor: No Dry / Scaly: No Maceration: No Temperature / Pain Temperature: No Abnormality Tenderness on Palpation: Yes Wound Preparation Ulcer Cleansing: Rinsed/Irrigated with Saline Topical Anesthetic Applied: Other: lidocaine 4%, Treatment Notes Wound #1 (Left, Lateral Lower Leg) Notes BFD and tape Electronic Signature(s) Signed: 03/26/2018 10:02:59 AM By: [name], [name], RN, [name], [name], [name] Entered By: [name], [name], RN, [name], [name] on 03/23/2018 08:18:13 [name], [name] (  [id number]) -------------------------------------------------------------------------------- Vitals Details Patient Name: [name], [name] Date of Service: 03/23/2018 8:00 AM Medical Record Number: [id number] Patient Account Number: [id number] Date of Birth/Sex: February 01, 1981 (37 y.o. M) Treating RN: [name] Primary Care Kessa Fairbairn: [name] Other Clinician: Referring Pal Shell: [name] Treating Lynnleigh Soden/Extender: Melburn Hake, [name]: 49 Vital Signs Time Taken: 08:00 Temperature (F): 98.2 Height (in): 71 Pulse (bpm): 90 Weight (lbs): 338 Respiratory Rate (breaths/min): 16 Body Mass Index (BMI): 47.1 Blood Pressure (mmHg): 144/87 Reference Range: 80 - 120 mg / dl Electronic Signature(s) Signed: 03/23/2018 2:08:51 PM By: Lorine Bears RCP, RRT, CHT Entered By: Becky Sax, Amado Nash on 03/23/2018 08:05:48

## 2018-04-06 ENCOUNTER — Encounter: Payer: 59 | Admitting: Physician Assistant

## 2018-04-06 DIAGNOSIS — L88 Pyoderma gangrenosum: Secondary | ICD-10-CM | POA: Diagnosis not present

## 2018-04-06 DIAGNOSIS — L97225 Non-pressure chronic ulcer of left calf with muscle involvement without evidence of necrosis: Secondary | ICD-10-CM | POA: Diagnosis not present

## 2018-04-08 NOTE — Progress Notes (Signed)
[name], [name] ([id number]) Visit Report for 04/06/2018 Arrival Information Details Patient Name: [name], [name] Date of Service: 04/06/2018 8:00 AM Medical Record Number: [id number] Patient Account Number: [id number] Date of Birth/Sex: [birth year] (37 y.o. M) Treating RN: [name] Primary Care [name]: [name] Other Clinician: Referring [name]: [name] Treating [name]/Extender: [name], [name]: 46 Visit Information History Since Last Visit Added or deleted any medications: Yes Patient Arrived: Ambulatory Any new allergies or adverse reactions: No Arrival Time: 08:06 Had a fall or experienced change in No Accompanied By: self activities of daily living that may affect Transfer Assistance: None risk of falls: Patient Identification Verified: Yes Signs or symptoms of abuse/neglect since last visito No Secondary Verification Process Completed: Yes Hospitalized since last visit: No Patient Requires Transmission-Based No Implantable device outside of the clinic excluding No Precautions: cellular tissue based products placed in the center Patient Has Alerts: No since last visit: Has Dressing in Place as Prescribed: Yes Pain Present Now: Yes Electronic Signature(s) Signed: 04/06/2018 4:23:53 PM By: [name] RCP, RRT, CHT Entered By: [name] on 04/06/2018 08:07:13 [name] ([id number]) -------------------------------------------------------------------------------- Clinic Level of Care Assessment Details Patient Name: [name], [name] Date of Service: 04/06/2018 8:00 AM Medical Record Number: [id number] Patient Account Number: [id number] Date of Birth/Sex: March 24, 1981 (37 y.o. M) Treating RN: [name] Primary Care [name]: [name] Other Clinician: Referring [name]: [name] Treating [name]/Extender: [name], [name]: 70 Clinic Level of Care Assessment  Items TOOL 4 Quantity Score []  - Use when only an EandM is performed on FOLLOW-UP visit 0 ASSESSMENTS - Nursing Assessment / Reassessment X - Reassessment of Co-morbidities (includes updates in patient status) 1 10 X- 1 5 Reassessment of Adherence to [name] Plan ASSESSMENTS - Wound and Skin Assessment / Reassessment X - Simple Wound Assessment / Reassessment - one wound 1 5 []  - 0 Complex Wound Assessment / Reassessment - multiple wounds []  - 0 Dermatologic / Skin Assessment (not related to wound area) ASSESSMENTS - Focused Assessment X - Circumferential Edema Measurements - multi extremities 1 5 []  - 0 Nutritional Assessment / Counseling / Intervention X- 1 5 Lower Extremity Assessment (monofilament, tuning fork, pulses) []  - 0 Peripheral Arterial Disease Assessment (using hand held doppler) ASSESSMENTS - Ostomy and/or Continence Assessment and Care []  - Incontinence Assessment and Management 0 []  - 0 Ostomy Care Assessment and Management (repouching, etc.) PROCESS - Coordination of Care X - Simple Patient / Family Education for ongoing care 1 15 []  - 0 Complex (extensive) Patient / Family Education for ongoing care []  - 0 Staff obtains Programmer, systems, Records, Test Results / Process Orders []  - 0 Staff telephones HHA, Nursing Homes / Clarify orders / etc []  - 0 Routine Transfer to another Facility (non-emergent condition) []  - 0 Routine Hospital Admission (non-emergent condition) []  - 0 New Admissions / Biomedical engineer / Ordering NPWT, Apligraf, etc. []  - 0 Emergency Hospital Admission (emergent condition) X- 1 10 Simple Discharge Coordination [name], [name] ([id number]) []  - 0 Complex (extensive) Discharge Coordination PROCESS - Special Needs []  - Pediatric / Minor Patient Management 0 []  - 0 Isolation Patient Management []  - 0 Hearing / Language / Visual special needs []  - 0 Assessment of Community assistance (transportation, D/C planning, etc.) []  -  0 Additional assistance / Altered mentation []  - 0 Support Surface(s) Assessment (bed, cushion, seat, etc.) INTERVENTIONS - Wound Cleansing / Measurement X - Simple Wound Cleansing - one wound 1 5 []  - 0 Complex  Wound Cleansing - multiple wounds X- 1 5 Wound Imaging (photographs - any number of wounds) []  - 0 Wound Tracing (instead of photographs) X- 1 5 Simple Wound Measurement - one wound []  - 0 Complex Wound Measurement - multiple wounds INTERVENTIONS - Wound Dressings X - Small Wound Dressing one or multiple wounds 1 10 []  - 0 Medium Wound Dressing one or multiple wounds []  - 0 Large Wound Dressing one or multiple wounds X- 1 5 Application of Medications - topical []  - 0 Application of Medications - injection INTERVENTIONS - Miscellaneous []  - External ear exam 0 []  - 0 Specimen Collection (cultures, biopsies, blood, body fluids, etc.) []  - 0 Specimen(s) / Culture(s) sent or taken to Lab for analysis []  - 0 Patient Transfer (multiple staff / Civil Service fast streamer / Similar devices) []  - 0 Simple Staple / Suture removal (25 or less) []  - 0 Complex Staple / Suture removal (26 or more) []  - 0 Hypo / Hyperglycemic Management (close monitor of Blood Glucose) []  - 0 Ankle / Brachial Index (ABI) - do not check if billed separately X- 1 5 Vital Signs [name], [name] ([id number]) Has the patient been seen at the hospital within the last three years: Yes Total Score: 90 Level Of Care: New/Established - Level 3 Electronic Signature(s) Signed: 04/06/2018 4:40:46 PM By: [name] Entered By: [name] on 04/06/2018 08:25:08 [name] ([id number]) -------------------------------------------------------------------------------- Encounter Discharge Information Details Patient Name: [name] Date of Service: 04/06/2018 8:00 AM Medical Record Number: [id number] Patient Account Number: [id number] Date of Birth/Sex: 07-Nov-1980 (37 y.o. M) Treating RN: [name] Primary Care [name]: [name] Other Clinician: Referring [name]: [name] Treating [name]: [name], [name]: 53 Encounter Discharge Information Items Discharge Condition: Stable Ambulatory Status: Ambulatory Discharge Destination: Home Transportation: Private Auto Accompanied By: self Schedule Follow-up Appointment: Yes Clinical Summary of Care: Electronic Signature(s) Signed: 04/06/2018 4:40:46 PM By: [name] Entered By: [name] on 04/06/2018 08:27:07 [name] ([id number]) -------------------------------------------------------------------------------- Lower Extremity Assessment Details Patient Name: [name] Date of Service: 04/06/2018 8:00 AM Medical Record Number: [id number] Patient Account Number: [id number] Date of Birth/Sex: 11/24/1980 (37 y.o. M) Treating RN: [name] Primary Care Lilianne Delair: [name] Other Clinician: Referring Ersie Savino: [name] Treating Ahriyah Vannest/Extender: [name], [name]: 13 Edema Assessment Assessed: [Left: No] [Right: No] [Left: Edema] [Right: :] Calf Left: Right: Point of Measurement: 30 cm From Medial Instep 48.4 cm cm Ankle Left: Right: Point of Measurement: 12 cm From Medial Instep 30.3 cm cm Vascular Assessment Pulses: Dorsalis Pedis Palpable: [Left:Yes] Posterior Tibial Extremity colors, hair growth, and conditions: Extremity Color: [Left:Hyperpigmented] Hair Growth on Extremity: [Left:Yes] Temperature of Extremity: [Left:Warm] Capillary Refill: [Left:< 3 seconds] Toe Nail Assessment Left: Right: Thick: Yes Discolored: Yes Deformed: No Improper Length and Hygiene: Yes Electronic Signature(s) Signed: 04/06/2018 4:40:46 PM By: [name] Entered By: [name] on 04/06/2018 08:18:08 [name] ([id number]) -------------------------------------------------------------------------------- Multi Wound Chart  Details Patient Name: [name] Date of Service: 04/06/2018 8:00 AM Medical Record Number: [id number] Patient Account Number: [id number] Date of Birth/Sex: [birth year] (37 y.o. 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] [N/A:N/A] Comorbid History: [1:Sleep  Apnea, Hypertension, Colitis] [N/A:N/A] Date Acquired: [1:11/18/2015] [N/A:N/A] Weeks of [name]: [1:69] [N/A:N/A] Wound Status: [1:Open] [N/A:N/A] Measurements L x W x D [1:3.4x4x0.5] [N/A:N/A] (cm) Area (cm) : [1:10.681] [N/A:N/A] Volume (cm) : [1:5.341] [N/A:N/A] % Reduction in Area: [1:-117.60%] [N/A:N/A] % Reduction in Volume: [1:-36.00%] [N/A:N/A] Classification: [1:Full Thickness With Exposed Support Structures] [N/A:N/A] Exudate Amount: [1:Large] [N/A:N/A] Exudate Type: [1:Serous] [N/A:N/A] Exudate Color: [1:amber] [N/A:N/A] Wound Margin: [1:Distinct, outline attached] [N/A:N/A] Granulation Amount: [1:Small (1-33%)] [N/A:N/A] Granulation Quality: [1:Red, Pink] [N/A:N/A] Necrotic Amount: [1:Large (67-100%)] [N/A:N/A] Exposed Structures: [1:Fat Layer (Subcutaneous Tissue) Exposed: Yes Muscle: Yes Fascia: No Tendon: No Joint: No Bone: No] [N/A:N/A] Epithelialization: [1:Small (1-33%)] [N/A:N/A] Periwound Skin Texture: [1:Induration: Yes Excoriation: No Callus: No Crepitus: No] [N/A:N/A] Rash: No Scarring: No Periwound Skin Moisture: Maceration: No N/A N/A Dry/Scaly: No Periwound Skin Color: Erythema: Yes N/A N/A Atrophie  Blanche: No Cyanosis: No Ecchymosis: No Hemosiderin Staining: No Mottled: No Pallor: No Rubor: No Erythema Location: Circumferential N/A N/A Temperature: No Abnormality N/A N/A Tenderness on Palpation: Yes N/A N/A Wound Preparation: Ulcer Cleansing: N/A N/A Rinsed/Irrigated with Saline Topical Anesthetic Applied: Other: lidocaine 4% [name] Notes Electronic Signature(s) Signed: 04/06/2018 4:40:46 PM By: [name] Entered By: [name] on 04/06/2018 08:18:19 [name], [name] ([id number]) -------------------------------------------------------------------------------- Multi-Disciplinary Care Plan Details Patient Name: [name] Date of Service: 04/06/2018 8:00 AM Medical Record Number: [id number] Patient Account Number: [id number] Date of Birth/Sex: 1980-11-04 (37 y.o. M) Treating RN: [name] Primary Care Azarah Dacy: [name] Other Clinician: Referring Zebbie Ace: [name] Treating Serine Kea/Extender: [name], [name]: 84 Active Inactive ` Orientation to the Wound Care Program Nursing Diagnoses: Knowledge deficit related to the wound healing center program Goals: Patient/caregiver will verbalize understanding of the [place] Program Date Initiated: 12/11/2016 Target Resolution Date: 03/13/2017 Goal Status: Active Interventions: Provide education on orientation to the wound center Notes: ` Venous Leg Ulcer Nursing Diagnoses: Knowledge deficit related to disease process and management Potential for venous Insuffiency (use before diagnosis confirmed) Goals: Non-invasive venous studies are completed as ordered Date Initiated: 12/11/2016 Target Resolution Date: 03/13/2017 Goal Status: Active Patient will maintain optimal edema control Date Initiated: 12/11/2016 Target Resolution Date: 03/13/2017 Goal Status: Active Patient/caregiver will verbalize understanding of disease process and disease management Date  Initiated: 12/11/2016 Target Resolution Date: 03/13/2017 Goal Status: Active Verify adequate tissue perfusion prior to therapeutic compression application Date Initiated: 12/11/2016 Target Resolution Date: 03/13/2017 Goal Status: Active Interventions: Assess peripheral edema status every visit. Compression as ordered Provide education on venous insufficiency [name] Activities: [name], [name] ([id number]) Therapeutic compression applied : 12/11/2016 Notes: ` Wound/Skin Impairment Nursing Diagnoses: Impaired tissue integrity Knowledge deficit related to ulceration/compromised skin integrity Goals: Patient/caregiver will verbalize understanding of skin care regimen Date Initiated: 12/11/2016 Target Resolution Date: 03/13/2017 Goal Status: Active Ulcer/skin breakdown will have a volume reduction of 30% by week 4 Date Initiated: 12/11/2016 Target Resolution Date: 03/13/2017 Goal Status: Active Ulcer/skin breakdown will have a volume reduction of 50% by week 8 Date Initiated: 12/11/2016 Target Resolution Date: 03/13/2017 Goal Status: Active Ulcer/skin breakdown will have a volume reduction of 80% by week 12 Date Initiated: 12/11/2016 Target Resolution Date: 03/13/2017 Goal Status: Active Ulcer/skin breakdown will heal within 14 weeks Date Initiated: 12/11/2016 Target Resolution Date: 03/13/2017 Goal Status: Active Interventions: Assess patient/caregiver ability to obtain necessary supplies Assess patient/caregiver ability to perform ulcer/skin care regimen upon admission and as needed Assess ulceration(s) every visit Provide education on ulcer and skin care [name] Activities: Skin care regimen initiated : 12/11/2016 Topical wound management  initiated : 12/11/2016 Notes: Electronic Signature(s) Signed: 04/06/2018 4:40:46 PM By: [name] Entered By: [name] on 04/06/2018 08:18:11 [name], [name]  ([id number]) -------------------------------------------------------------------------------- Pain Assessment Details Patient Name: [name] Date of Service: 04/06/2018 8:00 AM Medical Record Number: [id number] Patient Account Number: [id number] Date of Birth/Sex: February 07, 1981 (37 y.o. M) Treating RN: [name] Primary Care [name] [name]: [name] Other Clinician: Referring Sesilia Poucher: [name] Treating Marjarie Irion/Extender: [name], [name]: 57 Active Problems Location of Pain Severity and Description of Pain Patient Has Paino Yes Site Locations Rate the pain. Current Pain Level: 1 Pain Management and Medication Current Pain Management: Electronic Signature(s) Signed: 04/06/2018 4:23:53 PM By: [name] RCP, RRT, CHT Signed: 04/07/2018 10:18:29 AM By: [name], [name], RN, [name], [name], [name] Entered By: [name] on 04/06/2018 08:07:24 [name], [name] ([id number]) -------------------------------------------------------------------------------- Patient/Caregiver Education Details Patient Name: [name] Date of Service: 04/06/2018 8:00 AM Medical Record Number: [id number] Patient Account Number: [id number] Date of Birth/Gender: [birth year] (37 y.o. M) Treating RN: [name] Primary Care Physician: [name] Other Clinician: Referring Physician: Alma [name] Treating Physician/Extender: Sharalyn Ink in [name]: 54 Education Assessment Education Provided To: Patient Education Topics Provided Wound/Skin Impairment: Handouts: Other: wound cleansing Methods: Explain/Verbal Responses: State content correctly Electronic Signature(s) Signed: 04/06/2018 4:40:46 PM By: [name] Entered By: [name] on 04/06/2018 08:24:35 [name] ([id number]) -------------------------------------------------------------------------------- Wound Assessment Details Patient Name: [name] Date of  Service: 04/06/2018 8:00 AM Medical Record Number: [id number] Patient Account Number: [id number] Date of Birth/Sex: Feb 12, 1981 (37 y.o. M) Treating RN: [name] Primary Care Aimar Borghi: [name] Other Clinician: Referring Madeline Bebout: [name] Treating Malcolm Quast/Extender: [name], [name]: 71 Wound Status Wound Number: 1 Primary Etiology: Pyoderma Wound Location: Left Lower Leg - Lateral Wound Status: Open Wounding Event: Gradually Appeared Comorbid History: Sleep Apnea, Hypertension, Colitis Date Acquired: 11/18/2015 Weeks Of [name]: 69 Clustered Wound: No Photos Photo Uploaded By: [name] on 04/06/2018 08:47:49 Wound Measurements Length: (cm) 3.4 Width: (cm) 4 Depth: (cm) 0.5 Area: (cm) 10.681 Volume: (cm) 5.341 % Reduction in Area: -117.6% % Reduction in Volume: -36% Epithelialization: Small (1-33%) Tunneling: No Undermining: No Wound Description Full Thickness With Exposed Support Classification: Structures Wound Margin: Distinct, outline attached Exudate Large Amount: Exudate Type: Serous Exudate Color: amber Foul Odor After Cleansing: No Slough/Fibrino Yes Wound Bed Granulation Amount: Small (1-33%) Exposed Structure Granulation Quality: Red, Pink Fascia Exposed: No Necrotic Amount: Large (67-100%) Fat Layer (Subcutaneous Tissue) Exposed: Yes Necrotic Quality: Adherent Slough Tendon Exposed: No Muscle Exposed: Yes Necrosis of Muscle: No Joint Exposed: No [name], [name] ([id number]) Bone Exposed: No Periwound Skin Texture Texture Color No Abnormalities Noted: No No Abnormalities Noted: No Callus: No Atrophie Blanche: No Crepitus: No Cyanosis: No Excoriation: No Ecchymosis: No Induration: Yes Erythema: Yes Rash: No Erythema Location: Circumferential Scarring: No Hemosiderin Staining: No Mottled: No Moisture Pallor: No No Abnormalities Noted: No Rubor: No Dry / Scaly: No Maceration: No  Temperature / Pain Temperature: No Abnormality Tenderness on Palpation: Yes Wound Preparation Ulcer Cleansing: Rinsed/Irrigated with Saline Topical Anesthetic Applied: Other: lidocaine 4%, [name] Notes Wound #1 (Left, Lateral Lower Leg) Notes santyl, BFD and patient's own compression Electronic Signature(s) Signed: 04/06/2018 4:40:46 PM By: [name] Entered By: [name] on 04/06/2018 08:15:24 [name] ([id number]) -------------------------------------------------------------------------------- Vitals Details Patient Name: [name] Date of Service: 04/06/2018 8:00 AM Medical Record Number: [id number] Patient Account Number: [id number] Date of Birth/Sex: 13-Oct-1980 (37 y.o. M) Treating RN: [name] Primary Care Pranav Lince: Carlis Abbott,  KATHERINE Other Clinician: Referring Ave Sch[address]st: [name] Treating Seana Underwood/Extender: [name], [name]: 79 Vital Signs Time Taken: 08:05 Temperature (F): 98.4 Height (in): 71 Pulse (bpm): 91 Weight (lbs): 338 Respiratory Rate (breaths/min): 16 Body Mass Index (BMI): 47.1 Blood Pressure (mmHg): 182/81 Reference Range: 80 - 120 mg / dl Electronic Signature(s) Signed: 04/06/2018 4:23:53 PM By: [name] RCP, RRT, CHT Entered By: [name] on 04/06/2018 08:13:01

## 2018-04-08 NOTE — Progress Notes (Signed)
[name], [name] ([id number]) Visit Report for 04/06/2018 Chief Complaint Document Details Patient Name: [name], [name] Date of Service: 04/06/2018 8:00 AM Medical Record Number: [id number] Patient Account Number: [id number] Date of Birth/Sex: Feb 03, 1981 (37 y.o. M) Treating RN: [name] Primary Care Provider: Alma [name] Other Clinician: Referring Provider: Alma [name] Treating Provider/Extender: [name], [name] Weeks in Treatment: 65 Information Obtained from: Patient Chief Complaint He is here in follow up evaluation for LLE pyoderma ulcer Electronic Signature(s) Signed: 04/06/2018 12:51:28 PM By: [name] Keeler PA-C Entered By: [name] on 04/06/2018 08:21:34 [name], [name] ([id number]) -------------------------------------------------------------------------------- HPI Details Patient Name: [name] Date of Service: 04/06/2018 8:00 AM Medical Record Number: [id number] Patient Account Number: [id number] Date of Birth/Sex: [birth year] (37 y.o. M) Treating RN: [name] Primary Care Provider: Alma [name] Other Clinician: Referring Provider: Alma [name] Treating Provider/Extender: [name], [name] Weeks in Treatment: 30 History of Present Illness HPI Description: 12/04/16; 37 year old man who comes into the clinic today for review of a wound on the posterior left calf. He tells me that is been there for about a year. He is not a diabetic he does smoke half a pack per day. He was seen in the ER on 11/20/16 felt to have cellulitis around the wound and was given clindamycin. An x-ray did not show osteomyelitis. The patient initially tells me that he has a milk allergy that sets off a pruritic itching rash on his lower legs which she scratches incessantly and he thinks that's what may have set up the wound. He has been using various topical antibiotics and ointments without any effect. He works in a [employer] and is on his feet all day. He does not have a prior  history of wounds however he does have the rash on both lower legs the right arm and the ventral aspect of his left arm. These are excoriations and clearly have had scratching however there are of macular looking areas on both legs including a substantial larger area on the right leg. This does not have an underlying open area. There is no blistering. The patient tells me that 2 years ago in [place] in response to the rash on his legs he saw a dermatologist who told him he had a condition which may be pyoderma gangrenosum although I may be putting words into his mouth. He seemed to recognize this. 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 He is not in any treatment for this. He's had no recent travel 12/11/16; the patient arrives today with his wound and roughly the same condition we've been using silver alginate this is a deep punched out wound with some surrounding erythema but no tenderness. Biopsy I did did not show confirmed pyoderma gangrenosum suggested nonspecific inflammation and vasculitis but does not provide an actual description of what was seen by the pathologist. I'm really not able to understand this We have also received information from the patient's dermatologist in [place] notes from April 2016. This was a doctor [name]- [name]. 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 He was prescribed topical steroid high potency under occlusion which helped but at this point the patient did not have a deep punched out wound. 12/18/16; the patient's wound is larger in terms of surface area however this surface looks better and there is less depth. The surrounding erythema also is better. The patient states that the wrap we put on came off 2 days ago when he has been using  his compression stockings. He we are in the process of getting a dermatology consult. 12/26/16 on evaluation today patient's left lower extremity wound shows evidence of  infection with surrounding erythema noted. He has been tolerating the dressing changes but states that he has noted more discomfort. There is a larger area of erythema surrounding the wound. No fevers, chills, nausea, or vomiting noted at this time. With that being said the wound still does have slough covering the surface. He is not allergic to any medication that he is aware of at this point. In regard to his right lower extremity he had several regions that are erythematous and pruritic he wonders if there's anything we can do to help that. 01/02/17 I reviewed patient's wound culture which was obtained his visit last week. He was placed on doxycycline at that point. Unfortunately that does not appear to be an antibiotic that would likely help with the situation however the pseudomonas noted on culture is sensitive to Cipro. Also unfortunately patient's wound seems to have a large compared to last week's evaluation. Not severely so but there are definitely increased measurements in general. He is continuing to have discomfort as well he writes this to be a seven out of 10. In fact he would prefer me not to perform any debridement today due to the fact that he is having discomfort and considering he has an active infection on the little reluctant to do so anyway. No fevers, chills, nausea, or vomiting noted at this time. 01/08/17; patient seems dermatology on September 5. I suspect dermatology will want the slides from the biopsy I did sent to their pathologist. I'm not sure if there is a way we can expedite that. In any case the culture I did before I left on vacation 3 weeks ago showed Pseudomonas he was given 10 days of Cipro and per her description of her intake nurses is actually somewhat better this week although the wound is quite a bit bigger than I remember the last time I saw this. He still has 3 more days of Cipro [name], [name] ([id number]) 01/21/17; dermatology appointment tomorrow. He has  completed the ciprofloxacin for Pseudomonas. Surface of the wound looks better however he is had some deterioration in the lesions on his right leg. Meantime the left lateral leg wound we will continue with sample 01/29/17; patient had his dermatology appointment but I can't yet see that note. He is completed his antibiotics. The wound is more superficial but considerably larger in circumferential area than when he came in. This is in his left lateral calf. He also has swollen erythematous areas with superficial wounds on the right leg and small papular areas on both arms. There apparently areas in her his upper thighs and buttocks I did not look at those. Dermatology biopsied the right leg. Hopefully will have their input next week. 02/05/17; patient went back to see his dermatologist who told him that he had a "scratching problem" as well as staph. He is now on a 30 day course of doxycycline and I believe she gave him triamcinolone cream to the right leg areas to help with the itching [not exactly sure but probably triamcinolone]. She apparently looked at the left lateral leg wound although this was not rebiopsied and I think felt to be ultimately part of the same pathogenesis. He is using sample border foam and changing nevus himself. He now has a new open area on the right posterior leg which was his biopsy site I don't  have any of the dermatology notes 02/12/17; we put the patient in compression last week with SANTYL to the wound on the left leg and the biopsy. Edema is much better and the depth of the wound is now at level of skin. Area is still the same oBiopsy site on the right lateral leg we've also been using santyl with a border foam dressing and he is changing this himself. 02/19/17; Using silver alginate started last week to both the substantial left leg wound and the biopsy site on the right wound. He is tolerating compression well. Has a an appointment with his primary M.D. tomorrow  wondering about diuretics although I'm wondering if the edema problem is actually lymphedema 02/26/17; the patient has been to see his primary doctor Dr. Jerrel Ivory at [facility] our primary care. She started him on Lasix 20 mg and this seems to have helped with the edema. However we are not making substantial change with the left lateral calf wound and inflammation. The biopsy site on the right leg also looks stable but not really all that different. 03/12/17; the patient has been to see vein and vascular Dr. Lucky Cowboy. He has had venous reflux studies I have not reviewed these. I did get a call from his dermatology office. 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 They therefore recommended clobetasol and tetracycline i.e. 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 03/26/17; apparently the patient just had reflux studies not an appointment with Dr. dew. She arrives in clinic today having applied clobetasol for 2-3 weeks. He notes over the last 2-3 days excessive drainage having to change the dressing 3-4 times a day and also expanding erythema. 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 Lillia Carmel at [facility] dermatology locally who kindly saw him at my request. 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 My concern was the degree of erythema medially extending from his left leg wound which was either inflammation from  pyoderma or cellulitis. I put him on Augmentin however culture of the wound showed Pseudomonas which is quinolone sensitive. I really don't believe he has cellulitis however in view of everything I will continue and give him a course of Cipro. He is also on doxycycline as an immune modulator for the pyoderma. In addition to his original wound on the left lateral leg with surrounding erythema he has a wound on the right posterior calf which was an original biopsy site done by dermatology. 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 Saw Dr. Lillia Carmel yesterday. He has been using topical antibiotics to both wound areas his original wound on the left and the biopsies/pathergy area on the right. There is definitely some improvement in the inflammation around the wound on the right although the patient states he has increasing sensitivity of the wounds. He is on prednisone 60 and doxycycline 1 as prescribed by Dr. Lillia Carmel. He is covering the topical  antibiotic with gauze and putting this in his own compression stocks and changing this daily. He states that Dr. Lottie Rater did a culture of the left leg wound yesterday 05/07/17; pyoderma gangrenosum. The patient saw Dr. Lillia Carmel yesterday and has a follow-up with her in one month. He is still using topical antibiotics to both wounds although he can't recall exactly what type. He is still on prednisone 60 mg. Dr. Lillia Carmel stated that the doxycycline could stop if we were in agreement. 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 The right medial leg was induced by biopsy/pathergy. The area on the right is essentially healed. 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 The right medial leg has closed and  remains closed. He is still on prednisone 60. oHe tells me he missed his last dermatology appointment with Dr. Lillia Carmel but will make another appointment. He reports that her blood sugar at a recent screen in [place] was high 200's. He was 180 today. He is more cushingoid blood pressure is Smarr, Khasir ([id number]) up a bit. I think he is going to require still much longer prednisone perhaps another 3 months before attempting to taper. In the meantime his wound is a lot better. Smaller. He is cleaning this off daily and applying topical antibiotics. When he was last in the clinic I thought about changing to [facility] and actually put in a couple of calls to dermatology although probably not during their business hours. 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 He continues on oral prednisone. He has been using triple antibiotic ointment. There is surface debris and we will transition to [facility] and have him return in 2 weeks. He has lost 30 pounds since his last appointment with lifestyle modification. He may benefit from topical steroid cream for treatment this can be considered at a later date. 08/22/17 on evaluation today patient appears to actually be doing rather well in regard to his left lateral lower extremity ulcer. He has actually been managed by Dr. Dellia Nims most recently. Patient is currently on oral steroids at this time. This seems to have been of benefit for him. Nonetheless his last visit was actually with Leah on 08/06/17. Currently he is not utilizing any topical steroid creams although this could be of benefit as well. No fevers, chills, nausea, or vomiting noted at this time. 09/05/17 on evaluation today patient appears to be doing better in regard to his left lateral lower extremity ulcer. He has been tolerating the dressing changes without complication.  He is using Santyl with good effect. Overall I'm very pleased with how things are standing at this point. Patient likewise is happy that this is doing better. 09/19/17 on evaluation today patient actually appears to be doing rather well in regard to his left lateral lower extremity ulcer. 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 He's not having any significant pain. 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 He has been tolerating the Santyl without complication and in general I feel like he's making good  progress. 10/17/17 on evaluation today patient appears to be doing very well in regard to his left lateral lower surety ulcer. He has been tolerating the dressing changes without complication. There does not appear to be any evidence of infection he's alternating the Santyl and the triple antibiotic ointment every other day this seems to be doing well for him. 11/03/17 on evaluation today patient appears to be doing very well in regard to his left lateral lower extremity ulcer. He is been tolerating the dressing changes without complication which is good news. Fortunately there does not appear to be any evidence of infection which is also great news. Overall is doing excellent they are starting to taper down on the prednisone is down to 40 mg at this point it also started topical clobetasol for him. 11/17/17 on evaluation today patient appears to be doing well in regard to his left lateral lower surety ulcer. He's been tolerating the dressing changes without complication. He does note that he is having no pain, no excessive drainage or discharge, and overall he feels like things are going about how he would expect and hope they would. Overall he seems to have no evidence of infection at this time in my opinion which is  good news. 12/04/17-He is seen in follow-up evaluation for right lateral lower extremity ulcer. He has been applying topical steroid cream. Today's measurement show slight increase in size. Over the next 2 weeks we will transition to every other day Santyl and steroid cream. He has been encouraged to monitor for changes and notify clinic with any concerns 12/15/17 on evaluation today patient's left lateral motion the ulcer and fortunately is doing worse again at this point. This just since last week to this week has close to doubled in size according to the patient. I did not seeing last week's I do not have a visual to compare this to in our system was also down so we do not have all the charts and at this point. Nonetheless it does have me somewhat concerned in regard to the fact that again he was worried enough about it he has contact the dermatology that placed them back on the full strength, 50 mg a day of the prednisone that he was taken previous. He continues to alternate using clobetasol along with Santyl at this point. He is obviously somewhat frustrated. 12/22/17 on evaluation today patient appears to be doing a little worse compared to last evaluation. Unfortunately the wound is a little deeper and slightly larger than the last week's evaluation. With that being said he has made some progress in regard to the irritation surrounding at this time unfortunately despite that progress that's been made he still has a significant issue going on here. 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 For that reason I discussed with him today the possibility of perform a wound culture to ensure there's nothing overtly infected. 01/06/18 on evaluation today patient's wound is larger and deeper than previously evaluated. With that being said it did appear that  his wound was infected after my last evaluation with him. Subsequently I did end up prescribing a prescription for Bactrim DS which she has been taking and having no complication with. Fortunately there does not appear to be any evidence of Berroa, Kadien ([id number]) infection at this point in time as far as anything spreading, no want to  touch, and overall I feel like things are showing signs of improvement. 01/13/18 on evaluation today patient appears to be even a little larger and deeper than last time. There still muscle exposed in the base of the wound. Nonetheless he does appear to be less erythematous I do believe inflammation is calming down also believe the infection looks like it's probably resolved at this time based on what I'm seeing. No fevers, chills, nausea, or vomiting noted at this time. 01/30/18 on evaluation today patient actually appears to visually look better for the most part. Unfortunately those visually this looks better he does seem to potentially have what may be an abscess in the muscle that has been noted in the central portion of the wound. This is the first time that I have noted what appears to be fluctuance in the central portion of the muscle. With that being said I'm somewhat more concerned about the fact that this might indicate an abscess formation at this location. I do believe that an ultrasound would be appropriate. This is likely something we need to try to do as soon as possible. He has been switch to mupirocin ointment and he is no longer using the steroid ointment as prescribed by dermatology he sees them again next week he's been decreased from 60 to 40 mg of prednisone. 03/09/18 on evaluation today patient actually appears to be doing a little better compared to last time I saw him. There's not as much erythema surrounding the wound itself. He I did review his most recent infectious disease note which was dated 02/24/18. He saw Dr. Michel Bickers in  [place]. 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 Patient is now off of antibiotics and they are continually observing this. There seems to be no change in the past two weeks in my pinion based on what the patient says and what I see today compared to what Dr. Megan Salon likely saw two weeks ago. No fevers, chills, nausea, or vomiting noted at this time. 03/23/18 on evaluation today patient's wound actually appears to be showing signs of improvement which is good news. He is currently still on the Dapsone. He is also working on tapering the prednisone to get off of this and Dr. Lottie Rater is working with him in this regard. Nonetheless overall I feel like the wound is doing well it does appear based on the infectious disease note that I reviewed from Dr. 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 I did also review the results of his ultrasound of left lower extremity which revealed there was a dentist tissue in the base of the wound without an abscess noted. 04/06/18 on evaluation today the patient's left lateral lower extremity ulcer actually appears to be doing fairly well which is excellent news. There does not appear to be any evidence of infection at this time which is also great news. Overall he still does have a significantly large ulceration although little by little he seems to be making progress. He is down to 10 mg a day of the prednisone. Electronic Signature(s) Signed: 04/06/2018 12:51:28 PM By: [name] Keeler PA-C Entered By: [name] on 04/06/2018 08:43:31 [name], [name] ([id number]) -------------------------------------------------------------------------------- Physical Exam Details Patient Name: [name] Date of Service: 04/06/2018 8:00 AM Medical Record Number:  [id number] Patient Account Number: [id number] Date of Birth/Sex: 16-Jul-1980 (37 y.o. M) Treating RN: [name] Primary Care Provider: Alma [name] Other Clinician: Referring Provider: Alma [name] Treating Provider/Extender: [name], Kialee Kham Weeks in Treatment: 76 Constitutional Well-nourished and well-hydrated in no acute distress. Respiratory normal breathing without difficulty. clear to auscultation bilaterally. Cardiovascular regular rate and rhythm with normal S1, S2. Psychiatric this patient is able to make decisions and demonstrates good insight into disease process. Alert and Oriented x 3. pleasant and cooperative. Notes Patient's wound bed currently shows evidence of good granulation which is excellent news. He does have some Slough noted which I mechanically debride away what I could today without complication post debridement the wound bed appears to be doing significantly better. He definitely had much less slough noted compared to the last evaluation. Electronic Signature(s) Signed: 04/06/2018 12:51:28 PM By: [name] Keeler PA-C Entered By: [name] on 04/06/2018 08:44:03 [name] ([id number]) -------------------------------------------------------------------------------- Physician Orders Details Patient Name: [name] Date of Service: 04/06/2018 8:00 AM Medical Record Number: [id number] Patient Account Number: [id number] Date of Birth/Sex: 11/17/1980 (37 y.o. M) Treating RN: Montey Hora Primary Care Provider: Alma [name] Other Clinician: Referring Provider: Alma [name] Treating Provider/Extender: [name], Allyah Heather Weeks in Treatment: 9 Verbal / Phone Orders: No Diagnosis Coding ICD-10 Coding Code Description L97.222 Non-pressure chronic ulcer of left calf with fat layer exposed L88 Pyoderma gangrenosum I87.2 Venous insufficiency (chronic) (peripheral) Wound Cleansing Wound #1 Left,Lateral Lower Leg o May Shower, gently pat  wound dry prior to applying new dressing. Anesthetic (add to Medication List) Wound #1 Left,Lateral Lower Leg o Topical Lidocaine 4% cream applied to wound bed prior to debridement (In Clinic Only). Primary Wound Dressing Wound #1 Left,Lateral Lower Leg o Santyl Ointment Secondary Dressing Wound #1 Left,Lateral Lower Leg o Boardered Foam Dressing Dressing Change Frequency Wound #1 Left,Lateral Lower Leg o Change dressing every day. Follow-up Appointments Wound #1 Left,Lateral Lower Leg o Return Appointment in 2 weeks. Edema Control Wound #1 Left,Lateral Lower Leg o Patient to wear own compression stockings o Elevate legs to the level of the heart and pump ankles as often as possible Medications-please add to medication list. Wound #1 Left,Lateral Lower Leg o Santyl Enzymatic Ointment o Other: - Continue steroids [name], [name] ([id number]) Electronic Signature(s) Signed: 04/06/2018 12:51:28 PM By: [name] Keeler PA-C Signed: 04/06/2018 4:40:46 PM By: Montey Hora Entered By: Montey Hora on 04/06/2018 08:26:16 [name] ([id number]) -------------------------------------------------------------------------------- Problem List Details Patient Name: [name] Date of Service: 04/06/2018 8:00 AM Medical Record Number: [id number] Patient Account Number: [id number] Date of Birth/Sex: 06/13/1980 (37 y.o. M) Treating RN: [name] Primary Care Provider: Alma [name] Other Clinician: Referring Provider: Alma [name] Treating Provider/Extender: [name], Jakaree Pickard Weeks in Treatment: 71 Active Problems ICD-10 Evaluated Encounter Code Description Active Date Today Diagnosis L97.222 Non-pressure chronic ulcer of left calf with fat layer exposed 12/04/2016 No Yes L88 Pyoderma gangrenosum 03/26/2017 No Yes I87.2 Venous insufficiency (chronic) (peripheral) 12/04/2016 No Yes Inactive Problems ICD-10 Code Description Active Date Inactive Date L97.213  Non-pressure chronic ulcer of right calf with necrosis of muscle 04/02/2017 04/02/2017 Resolved Problems Electronic Signature(s) Signed: 04/06/2018 12:51:28 PM By: [name] Keeler PA-C Entered By: [name] on 04/06/2018 08:21:29 [name] ([id number]) -------------------------------------------------------------------------------- Progress Note Details Patient Name: [name] Date of Service: 04/06/2018 8:00 AM Medical Record Number: [id number] Patient Account Number: [id number] Date of Birth/Sex: 15-Sep-1980 (37 y.o. M) Treating RN: [name] Primary Care Provider: Alma [name] Other Clinician: Referring Provider: Alma [name]  Treating Provider/Extender: STONE III, Kaidyn Javid Weeks in Treatment: 92 Subjective Chief Complaint Information obtained from Patient He is here in follow up evaluation for LLE pyoderma ulcer History of Present Illness (HPI) 12/04/16; 37 year old man who comes into the clinic today for review of a wound on the posterior left calf. He tells me that is been there for about a year. He is not a diabetic he does smoke half a pack per day. He was seen in the ER on 11/20/16 felt to have cellulitis around the wound and was given clindamycin. An x-ray did not show osteomyelitis. The patient initially tells me that he has a milk allergy that sets off a pruritic itching rash on his lower legs which she scratches incessantly and he thinks that's what may have set up the wound. He has been using various topical antibiotics and ointments without any effect. He works in a [employer] and is on his feet all day. He does not have a prior history of wounds however he does have the rash on both lower legs the right arm and the ventral aspect of his left arm. These are excoriations and clearly have had scratching however there are of macular looking areas on both legs including a substantial larger area on the right leg. This does not have an underlying open area. There  is no blistering. The patient tells me that 2 years ago in [place] in response to the rash on his legs he saw a dermatologist who told him he had a condition which may be pyoderma gangrenosum although I may be putting words into his mouth. He seemed to recognize this. 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 He is not in any treatment for this. He's had no recent travel 12/11/16; the patient arrives today with his wound and roughly the same condition we've been using silver alginate this is a deep punched out wound with some surrounding erythema but no tenderness. Biopsy I did did not show confirmed pyoderma gangrenosum suggested nonspecific inflammation and vasculitis but does not provide an actual description of what was seen by the pathologist. I'm really not able to understand this We have also received information from the patient's dermatologist in [place] notes from April 2016. This was a doctor [name]- [name]. 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 He was prescribed topical steroid high potency under occlusion which helped but at this point the patient did not have a deep punched out wound. 12/18/16; the patient's wound is larger in terms of surface area however this surface looks better and there is less depth. The surrounding erythema also is better. The patient states that the wrap we put on came off 2 days ago when he has been using his compression stockings. He we are in the process of getting a dermatology consult. 12/26/16 on evaluation today patient's left lower extremity wound shows evidence of infection with surrounding erythema noted. He has been tolerating the dressing changes but states that he has noted more discomfort. There is a larger area of erythema surrounding the wound. No fevers, chills, nausea, or vomiting noted at this time. With that being said the wound still does have slough covering the surface. He is not  allergic to any medication that he is aware of at this point. In regard to his right lower extremity he had several regions that are erythematous and pruritic he wonders if there's anything we can do to help that. 01/02/17 I reviewed  patient's wound culture which was obtained his visit last week. He was placed on doxycycline at that point. Unfortunately that does not appear to be an antibiotic that would likely help with the situation however the pseudomonas noted on culture is sensitive to Cipro. Also unfortunately patient's wound seems to have a large compared to last week's evaluation. Not severely so but there are definitely increased measurements in general. He is continuing to have discomfort as well he writes this to be a seven out of 10. In fact he would prefer me not to perform any debridement today due to the fact that he is having discomfort and considering he has an active infection on the little reluctant to do so anyway. No fevers, Heslop, Nehemiah ([id number]) chills, nausea, or vomiting noted at this time. 01/08/17; patient seems dermatology on September 5. I suspect dermatology will want the slides from the biopsy I did sent to their pathologist. I'm not sure if there is a way we can expedite that. In any case the culture I did before I left on vacation 3 weeks ago showed Pseudomonas he was given 10 days of Cipro and per her description of her intake nurses is actually somewhat better this week although the wound is quite a bit bigger than I remember the last time I saw this. He still has 3 more days of Cipro 01/21/17; dermatology appointment tomorrow. He has completed the ciprofloxacin for Pseudomonas. Surface of the wound looks better however he is had some deterioration in the lesions on his right leg. Meantime the left lateral leg wound we will continue with sample 01/29/17; patient had his dermatology appointment but I can't yet see that note. He is completed his antibiotics. The  wound is more superficial but considerably larger in circumferential area than when he came in. This is in his left lateral calf. He also has swollen erythematous areas with superficial wounds on the right leg and small papular areas on both arms. There apparently areas in her his upper thighs and buttocks I did not look at those. Dermatology biopsied the right leg. Hopefully will have their input next week. 02/05/17; patient went back to see his dermatologist who told him that he had a "scratching problem" as well as staph. He is now on a 30 day course of doxycycline and I believe she gave him triamcinolone cream to the right leg areas to help with the itching [not exactly sure but probably triamcinolone]. She apparently looked at the left lateral leg wound although this was not rebiopsied and I think felt to be ultimately part of the same pathogenesis. He is using sample border foam and changing nevus himself. He now has a new open area on the right posterior leg which was his biopsy site I don't have any of the dermatology notes 02/12/17; we put the patient in compression last week with SANTYL to the wound on the left leg and the biopsy. Edema is much better and the depth of the wound is now at level of skin. Area is still the same Biopsy site on the right lateral leg we've also been using santyl with a border foam dressing and he is changing this himself. 02/19/17; Using silver alginate started last week to both the substantial left leg wound and the biopsy site on the right wound. He is tolerating compression well. Has a an appointment with his primary M.D. tomorrow wondering about diuretics although I'm wondering if the edema problem is actually lymphedema 02/26/17; the patient has been  to see his primary doctor Dr. Jerrel Ivory at [place] our primary care. She started him on Lasix 20 mg and this seems to have helped with the edema. However we are not making substantial change with the  left lateral calf wound and inflammation. The biopsy site on the right leg also looks stable but not really all that different. 03/12/17; the patient has been to see vein and vascular Dr. Lucky Cowboy. He has had venous reflux studies I have not reviewed these. I did get a call from his dermatology office. 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 They therefore recommended clobetasol and tetracycline i.e. 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 03/26/17; apparently the patient just had reflux studies not an appointment with Dr. dew. She arrives in clinic today having applied clobetasol for 2-3 weeks. He notes over the last 2-3 days excessive drainage having to change the dressing 3-4 times a day and also expanding erythema. 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 Lillia Carmel at [facility] dermatology locally who kindly saw him at my request. 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 My concern was the degree of erythema medially extending from his left leg wound which was either inflammation from pyoderma or cellulitis. I put him on Augmentin however culture of the wound showed Pseudomonas which is quinolone sensitive. I really don't believe he has cellulitis however in view of everything I will continue and give him a course of Cipro. He is also on doxycycline as an immune modulator for the pyoderma. In addition to  his original wound on the left lateral leg with surrounding erythema he has a wound on the right posterior calf which was an original biopsy site done by dermatology. 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 Saw Dr. Lillia Carmel yesterday. He has been using topical antibiotics to both wound areas his original wound on the left and the biopsies/pathergy area on the right. There is definitely some improvement in the inflammation around the wound on the right although the patient states he has increasing sensitivity of the wounds. He is on prednisone 60 and doxycycline 1 as prescribed by Dr. Lillia Carmel. He is covering the topical antibiotic with gauze and putting this in his own compression stocks and changing this daily. He states that Dr. Lottie Rater did a culture of the left leg wound yesterday 05/07/17; pyoderma gangrenosum. The patient saw Dr. Lillia Carmel yesterday and has a follow-up with her in one month. He is still using topical antibiotics to both wounds although he can't recall exactly what type. He is still on prednisone 60 mg. Dr. Lillia Carmel stated that the doxycycline could stop if we were in agreement. 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 The right medial leg was induced by [name] ([id number]) biopsy/pathergy. The area on the right is essentially  healed. 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 The right medial leg has closed and remains closed. He is still on prednisone 60. He tells me he missed his last dermatology appointment with Dr. Lillia Carmel but will make another appointment. He reports that her blood sugar at a recent screen in [place] was high 200's. He was 180 today. 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 I  think he is going to require still much longer prednisone perhaps another 3 months before attempting to taper. In the meantime his wound is a lot better. Smaller. He is cleaning this off daily and applying topical antibiotics. When he was last in the clinic I thought about changing to [facility] and actually put in a couple of calls to dermatology although probably not during their business hours. 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 He continues on oral prednisone. He has been using triple antibiotic ointment. There is surface debris and we will transition to [facility] and have him return in 2 weeks. He has lost 30 pounds since his last appointment with lifestyle modification. He may benefit from topical steroid cream for treatment this can be considered at a later date. 08/22/17 on evaluation today patient appears to actually be doing rather well in regard to his left lateral lower extremity ulcer. He has actually been managed by Dr. Dellia Nims most recently. Patient is currently on oral steroids at this time. This seems to have been of benefit for him. Nonetheless his last visit was actually with Leah on 08/06/17. Currently he is not utilizing any topical steroid creams although this could be of benefit as well. No fevers, chills, nausea, or vomiting noted at this time. 09/05/17 on evaluation today patient appears to be doing better in regard to his left lateral lower extremity ulcer. He has been tolerating the dressing changes without complication. He is using Santyl with good effect. Overall I'm very pleased with how things are standing at this point. Patient likewise is happy that this is doing better. 09/19/17 on evaluation today patient actually appears to be doing rather well in regard to his left lateral lower extremity ulcer. Again this is secondary to Pyoderma  gangrenosum and he seems to be progressing well with the Santyl which is good news. He's not having any significant pain. 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 He has been tolerating the Santyl without complication and in general I feel like he's making good progress. 10/17/17 on evaluation today patient appears to be doing very well in regard to his left lateral lower surety ulcer. He has been tolerating the dressing changes without complication. There does not appear to be any evidence of infection he's alternating the Santyl and the triple antibiotic ointment every other day this seems to be doing well for him. 11/03/17 on evaluation today patient appears to be doing very well in regard to his left lateral lower extremity ulcer. He is been tolerating the dressing changes without complication which is good news. Fortunately there does not appear to be any evidence of infection which is also great news. Overall is doing excellent they are starting to taper down on the prednisone is down to  40 mg at this point it also started topical clobetasol for him. 11/17/17 on evaluation today patient appears to be doing well in regard to his left lateral lower surety ulcer. He's been tolerating the dressing changes without complication. He does note that he is having no pain, no excessive drainage or discharge, and overall he feels like things are going about how he would expect and hope they would. Overall he seems to have no evidence of infection at this time in my opinion which is good news. 12/04/17-He is seen in follow-up evaluation for right lateral lower extremity ulcer. He has been applying topical steroid cream. Today's measurement show slight increase in size. Over the next 2 weeks we will transition to every other day Santyl and steroid cream. He has been encouraged to monitor for changes  and notify clinic with any concerns 12/15/17 on evaluation today patient's left lateral motion the ulcer and fortunately is doing worse again at this point. This just since last week to this week has close to doubled in size according to the patient. I did not seeing last week's I do not have a visual to compare this to in our system was also down so we do not have all the charts and at this point. Nonetheless it does have me somewhat concerned in regard to the fact that again he was worried enough about it he has contact the dermatology that placed them back on the full strength, 50 mg a day of the prednisone that he was taken previous. He continues to alternate using clobetasol along with Santyl at this point. He is obviously somewhat frustrated. 12/22/17 on evaluation today patient appears to be doing a little worse compared to last evaluation. Unfortunately the wound is a little deeper and slightly larger than the last week's evaluation. With that being said he has made some progress in regard to the irritation surrounding at this time unfortunately despite that progress that's been made he still has a significant issue going on here. I'm not certain that he is having really any true infection at this time although with the Pyoderma gangrenosum it can [name], [name] ([id number]) sometimes be difficult to differentiate infection versus just inflammation. For that reason I discussed with him today the possibility of perform a wound culture to ensure there's nothing overtly infected. 01/06/18 on evaluation today patient's wound is larger and deeper than previously evaluated. With that being said it did appear that his wound was infected after my last evaluation with him. Subsequently I did end up prescribing a prescription for Bactrim DS which she has been taking and having no complication with. Fortunately there does not appear to be any evidence of infection at this point in time as far as anything  spreading, no want to touch, and overall I feel like things are showing signs of improvement. 01/13/18 on evaluation today patient appears to be even a little larger and deeper than last time. There still muscle exposed in the base of the wound. Nonetheless he does appear to be less erythematous I do believe inflammation is calming down also believe the infection looks like it's probably resolved at this time based on what I'm seeing. No fevers, chills, nausea, or vomiting noted at this time. 01/30/18 on evaluation today patient actually appears to visually look better for the most part. Unfortunately those visually this looks better he does seem to potentially have what may be an abscess in the muscle that has been noted in the central portion of  the wound. This is the first time that I have noted what appears to be fluctuance in the central portion of the muscle. With that being said I'm somewhat more concerned about the fact that this might indicate an abscess formation at this location. I do believe that an ultrasound would be appropriate. This is likely something we need to try to do as soon as possible. He has been switch to mupirocin ointment and he is no longer using the steroid ointment as prescribed by dermatology he sees them again next week he's been decreased from 60 to 40 mg of prednisone. 03/09/18 on evaluation today patient actually appears to be doing a little better compared to last time I saw him. There's not as much erythema surrounding the wound itself. He I did review his most recent infectious disease note which was dated 02/24/18. He saw Dr. Michel Bickers in [place]. 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 Patient is now off of antibiotics and they are continually observing this. There seems to be no change in the past two weeks in my pinion based on what the patient says and what I see  today compared to what Dr. Megan Salon likely saw two weeks ago. No fevers, chills, nausea, or vomiting noted at this time. 03/23/18 on evaluation today patient's wound actually appears to be showing signs of improvement which is good news. He is currently still on the Dapsone. He is also working on tapering the prednisone to get off of this and Dr. Lottie Rater is working with him in this regard. Nonetheless overall I feel like the wound is doing well it does appear based on the infectious disease note that I reviewed from Dr. 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 I did also review the results of his ultrasound of left lower extremity which revealed there was a dentist tissue in the base of the wound without an abscess noted. 04/06/18 on evaluation today the patient's left lateral lower extremity ulcer actually appears to be doing fairly well which is excellent news. There does not appear to be any evidence of infection at this time which is also great news. Overall he still does have a significantly large ulceration although little by little he seems to be making progress. He is down to 10 mg a day of the prednisone. Patient History Information obtained from Patient. Family History Diabetes - Mother,Maternal Grandparents, Heart Disease - Mother,Maternal Grandparents, Hereditary Spherocytosis - Siblings, Hypertension - Maternal Grandparents, Stroke - Siblings, No family history of Cancer, Kidney Disease, Lung Disease, Seizures, Thyroid Problems, Tuberculosis. Social History Current some day smoker, Marital Status - Married, Alcohol Use - Rarely, Drug Use - No History, Caffeine Use - Moderate. Review of Systems (ROS) Constitutional Symptoms (General Health) Denies complaints or symptoms of Fever, Chills. Respiratory The patient has no complaints or symptoms. Cardiovascular The  patient has no complaints or symptoms. [name], [name] ([id number]) Psychiatric The patient has no complaints or symptoms. Objective Constitutional Well-nourished and well-hydrated in no acute distress. Vitals Time Taken: 8:05 AM, Height: 71 in, Weight: 338 lbs, BMI: 47.1, Temperature: 98.4 F, Pulse: 91 bpm, Respiratory Rate: 16 breaths/min, Blood Pressure: 182/81 mmHg. Respiratory normal breathing without difficulty. clear to auscultation bilaterally. Cardiovascular regular rate and rhythm with normal S1, S2. Psychiatric this patient  is able to make decisions and demonstrates good insight into disease process. Alert and Oriented x 3. pleasant and cooperative. General Notes: Patient's wound bed currently shows evidence of good granulation which is excellent news. He does have some Slough noted which I mechanically debride away what I could today without complication post debridement the wound bed appears to be doing significantly better. He definitely had much less slough noted compared to the last evaluation. Integumentary (Hair, Skin) Wound #1 status is Open. Original cause of wound was Gradually Appeared. The wound is located on the Left,Lateral Lower Leg. The wound measures 3.4cm length x 4cm width x 0.5cm depth; 10.681cm^2 area and 5.341cm^3 volume. There is muscle and Fat Layer (Subcutaneous Tissue) Exposed exposed. There is no tunneling or undermining noted. There is a large amount of serous drainage noted. The wound margin is distinct with the outline attached to the wound base. There is small (1-33%) red, pink granulation within the wound bed. There is a large (67-100%) amount of necrotic tissue within the wound bed including Adherent Slough. The periwound skin appearance exhibited: Induration, Erythema. The periwound skin appearance did not exhibit: Callus, Crepitus, Excoriation, Rash, Scarring, Dry/Scaly, Maceration, Atrophie Blanche, Cyanosis, Ecchymosis, Hemosiderin Staining,  Mottled, Pallor, Rubor. The surrounding wound skin color is noted with erythema which is circumferential. Periwound temperature was noted as No Abnormality. The periwound has tenderness on palpation. 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 Anesthetic (add to Medication List): Wound #1 Left,Lateral Lower Leg: Topical Lidocaine 4% cream applied to wound bed prior to debridement (In Clinic Only). Primary Wound Dressing: Wound #1 Left,Lateral Lower Leg: Santyl Ointment Secondary Dressing: Wound #1 Left,Lateral Lower Leg: Boardered Foam Dressing Dressing Change Frequency: Wound #1 Left,Lateral Lower Leg: Change dressing every day. Follow-up Appointments: Wound #1 Left,Lateral Lower Leg: Return Appointment in 2 weeks. Edema Control: Wound #1 Left,Lateral Lower Leg: Patient to wear own compression stockings Elevate legs to the level of the heart and pump ankles as often as possible Medications-please add to medication list.: Wound #1 Left,Lateral Lower Leg: Santyl Enzymatic Ointment Other: - Continue steroids At this point my suggestion is going to be that we go ahead and continue with the above wound care measures for the time being. The patient is in agreement that plan. If anything changes or worsens in the meantime he will contact the office and let me know. Please see above for specific wound care orders. We will see patient for re-evaluation in 2 week(s) here in the clinic. If anything worsens or changes patient will contact our office for additional recommendations. Electronic Signature(s) Signed: 04/06/2018 12:51:28 PM By: [name] Keeler PA-C Entered By: [name] on 04/06/2018 08:44:35 [name], [name]  ([id number]) -------------------------------------------------------------------------------- ROS/PFSH Details Patient Name: [name] Date of Service: 04/06/2018 8:00 AM Medical Record Number: [id number] Patient Account Number: [id number] Date of Birth/Sex: 09/30/1980 (37 y.o. M) Treating RN: [name] Primary Care Provider: Alma [name] Other Clinician: Referring Provider: Alma [name] Treating Provider/Extender: [name], Jasiah Buntin Weeks in Treatment: 22 Information Obtained From Patient Wound History Do you currently have one or more open woundso Yes How many open wounds do you currently haveo 1 Approximately how long have you had your woundso 1 year How have you been treating your wound(s) until nowo neosporin Has your wound(s) ever healed  and then re-openedo No Have you had any lab work done in the past montho No Have you tested positive for an antibiotic resistant organism (MRSA, VRE)o No Have you tested positive for osteomyelitis (bone infection)o No Have you had any tests for circulation on your legso No Have you had other problems associated with your woundso Infection, Swelling Constitutional Symptoms (General Health) Complaints and Symptoms: Negative for: Fever; Chills Eyes Medical History: Negative for: Cataracts; Glaucoma; Optic Neuritis Ear/Nose/Mouth/Throat Medical History: Negative for: Chronic sinus problems/congestion; Middle ear problems Hematologic/Lymphatic Medical History: Negative for: Anemia; Hemophilia; Human Immunodeficiency Virus; Lymphedema; Sickle Cell Disease Respiratory Complaints and Symptoms: No Complaints or Symptoms Medical History: Positive for: Sleep Apnea - cpap Negative for: Aspiration; Asthma; Chronic Obstructive Pulmonary Disease (COPD); Pneumothorax Cardiovascular Complaints and Symptoms: No Complaints or Symptoms Medical History: Positive for: Hypertension Rincon, [name] ([id number]) Negative for: Angina; Arrhythmia;  Congestive Heart Failure; Coronary Artery Disease; Deep Vein Thrombosis; Hypotension; Myocardial Infarction; Peripheral Arterial Disease; Peripheral Venous Disease; Phlebitis; Vasculitis Gastrointestinal Medical History: Positive for: Colitis Endocrine Medical History: Negative for: Type I Diabetes; Type II Diabetes Genitourinary Medical History: Negative for: End Stage Renal Disease Immunological Medical History: Negative for: Lupus Erythematosus; Raynaudos; Scleroderma Integumentary (Skin) Medical History: Negative for: History of Burn; History of pressure wounds Musculoskeletal Medical History: Negative for: Gout; Rheumatoid Arthritis; Osteoarthritis; Osteomyelitis Neurologic Medical History: Negative for: Dementia; Neuropathy; Quadriplegia; Paraplegia; Seizure Disorder Oncologic Medical History: Negative for: Received Chemotherapy; Received Radiation Psychiatric Complaints and Symptoms: No Complaints or Symptoms Medical History: Negative for: Anorexia/bulimia; Confinement Anxiety Immunizations Pneumococcal Vaccine: Received Pneumococcal Vaccination: No Implantable Devices Family and Social History [name], [name] ([id number]) Cancer: No; Diabetes: Yes - Mother,Maternal Grandparents; Heart Disease: Yes - Mother,Maternal Grandparents; Hereditary Spherocytosis: Yes - Siblings; Hypertension: Yes - Maternal Grandparents; Kidney Disease: No; Lung Disease: No; Seizures: No; Stroke: Yes - Siblings; Thyroid Problems: No; Tuberculosis: No; Current some day smoker; Marital Status - Married; Alcohol Use: Rarely; Drug Use: No History; Caffeine Use: Moderate; Financial Concerns: No; Food, Clothing or Shelter Needs: No; Support System Lacking: No; Transportation Concerns: No; Advanced Directives: No; Patient does not want information on Advanced Directives; Living Will: No Physician Affirmation I have reviewed and agree with the above information. Electronic Signature(s) Signed:  04/06/2018 12:51:28 PM By: [name] Keeler PA-C Signed: 04/07/2018 10:18:29 AM By: [name] [name], RN, [name], [name], [name] Entered By: [name] on 04/06/2018 08:43:47 [name], [name] ([id number]) -------------------------------------------------------------------------------- SuperBill Details Patient Name: [name] Date of Service: 04/06/2018 Medical Record Number: [id number] Patient Account Number: [id number] Date of Birth/Sex: 21-Oct-1980 (37 y.o. M) Treating RN: [name] Primary Care Provider: Alma [name] Other Clinician: Referring Provider: Alma [name] Treating Provider/Extender: [name], Katye Valek Weeks in Treatment: 46 Diagnosis Coding ICD-10 Codes Code Description [phone] Non-pressure chronic ulcer of left calf with fat layer exposed L88 Pyoderma gangrenosum I87.2 Venous insufficiency (chronic) (peripheral) Facility Procedures CPT4 Code: 03128118 Description: 86773 - WOUND CARE VISIT-LEV 3 EST PT Modifier: Quantity: 1 Physician Procedures CPT4 Code: [id number] Description: 94707 - WC PHYS LEVEL 4 - EST PT ICD-10 Diagnosis Description L97.222 Non-pressure chronic ulcer of left calf with fat layer expo L88 Pyoderma gangrenosum I87.2 Venous insufficiency (chronic) (peripheral) Modifier: sed Quantity: 1 Electronic Signature(s) Signed: 04/06/2018 12:51:28 PM By: [name] Keeler PA-C Entered By: [name] on 04/06/2018 08:44:48

## 2018-04-20 ENCOUNTER — Encounter: Payer: 59 | Attending: Physician Assistant | Admitting: Physician Assistant

## 2018-04-20 DIAGNOSIS — Z8249 Family history of ischemic heart disease and other diseases of the circulatory system: Secondary | ICD-10-CM | POA: Insufficient documentation

## 2018-04-20 DIAGNOSIS — T781XXA Other adverse food reactions, not elsewhere classified, initial encounter: Secondary | ICD-10-CM | POA: Diagnosis not present

## 2018-04-20 DIAGNOSIS — E11622 Type 2 diabetes mellitus with other skin ulcer: Secondary | ICD-10-CM | POA: Insufficient documentation

## 2018-04-20 DIAGNOSIS — I10 Essential (primary) hypertension: Secondary | ICD-10-CM | POA: Insufficient documentation

## 2018-04-20 DIAGNOSIS — K5190 Ulcerative colitis, unspecified, without complications: Secondary | ICD-10-CM | POA: Diagnosis not present

## 2018-04-20 DIAGNOSIS — L97222 Non-pressure chronic ulcer of left calf with fat layer exposed: Secondary | ICD-10-CM | POA: Diagnosis not present

## 2018-04-20 DIAGNOSIS — I872 Venous insufficiency (chronic) (peripheral): Secondary | ICD-10-CM | POA: Diagnosis not present

## 2018-04-20 DIAGNOSIS — L97213 Non-pressure chronic ulcer of right calf with necrosis of muscle: Secondary | ICD-10-CM | POA: Insufficient documentation

## 2018-04-20 DIAGNOSIS — Z823 Family history of stroke: Secondary | ICD-10-CM | POA: Insufficient documentation

## 2018-04-20 DIAGNOSIS — L88 Pyoderma gangrenosum: Secondary | ICD-10-CM | POA: Diagnosis not present

## 2018-04-20 DIAGNOSIS — L97225 Non-pressure chronic ulcer of left calf with muscle involvement without evidence of necrosis: Secondary | ICD-10-CM | POA: Diagnosis not present

## 2018-04-22 ENCOUNTER — Ambulatory Visit
Admit: 2018-04-22 | Discharge: 2018-04-23 | Payer: PRIVATE HEALTH INSURANCE | Attending: Dermatology | Primary: Dermatology

## 2018-04-22 DIAGNOSIS — Z79899 Other long term (current) drug therapy: Principal | ICD-10-CM

## 2018-04-22 DIAGNOSIS — L88 Pyoderma gangrenosum: Secondary | ICD-10-CM

## 2018-04-22 DIAGNOSIS — L820 Inflamed seborrheic keratosis: Secondary | ICD-10-CM

## 2018-04-22 DIAGNOSIS — L298 Other pruritus: Secondary | ICD-10-CM | POA: Diagnosis not present

## 2018-04-22 MED ORDER — CLOBETASOL 0.05 % TOPICAL OINTMENT
Freq: Two times a day (BID) | TOPICAL | 1 refills | 0 days | Status: CP
Start: 2018-04-22 — End: 2018-09-24

## 2018-04-22 MED ORDER — GABAPENTIN 300 MG CAPSULE
ORAL_CAPSULE | 1 refills | 0 days | Status: CP
Start: 2018-04-22 — End: 2018-09-24

## 2018-04-22 MED ORDER — PREDNISONE 10 MG TABLET
ORAL_TABLET | Freq: Every day | ORAL | 1 refills | 0.00000 days | Status: CP
Start: 2018-04-22 — End: 2018-09-24

## 2018-04-22 NOTE — Unmapped (Signed)
Dermatology Clinic Note    ASSESSMENT AND PLAN:    MRSA abscess in the setting of almost resolved pyoderma gangrenosum and chronic steroid use c/b cushingoid features, now with deep recurrent ulcer, improving: Improving in the setting of treatment for MRSA and weaning off of prednisone. Not concerning for recurrence of PG at this time. Patient starting to create new areas related to picking behaviors / itching while weaning off of prednisone. Reviewed importance of avoiding trauma and will do a slow taper of prednisone coupled with use of TAC cream. Discussed wearing long pants when possible to prevent access.  - cont abx per ID  - Decrease prednisone to 5 mg daily  - humira denied but will appeal given persistent recalcitrant PG in the setting of recurrent infections and side effects from prednisone with weight gain and cushingoid effects, pt has been on prednisone since November 2018, Goal to be off of prednisone as a chronic medication  -  bactroban ointment to wound, replacing clobetasol, otherwise wound care per wound  - Cont OTC calcium and Vit D  -  Recommend compression to alleviate venous stasis component  - will get labs as below, then increase dapsone to 150 mg daily      Pruritus / neurotic excoriations : Patient with good control on the L leg, but now starting to pick at the R lower leg - high potential for recurrent PG in the setting of trauma induced by patient. Patient itchy as we wean the prednisone above.   -start gabapentin 300 mg nightly, with eventual increase to 300 mg in the afternoon and 600 mg nightly  -consider doxepin  -start clobetasol ointment to affected areas on the R leg  - cont compression / covering the areas    High risk medication use - dapsone: previous hiv, hep b/c unremarkable on 10/18  -quant gold neg in 9/19 (for consideration of biologic)  -g6pd nl  Reviewed risks of dapsone including anemia, severe drug reactions, liver toxicity, motor neuropathy  ??  Seborrheic Keratoses-inflamed: After R/B/A discussed and verbal consent was obtained, a total of 1 lesions were treated wth cryotherapy x 1 ten second freeze-thaw cycle. The patient tolerated the procedure well, and was instructed on post-cryotherapy wound care.           RTC: 4  weeks  SUBJECTIVE    CC: f/u ulcers on leg    HPI:  This is a pleasant 37 y.o. male who presents today for follow up of non-healing ulcers. LV with myself on 11/19.Was seen by wound and culture demonstrated MRSA, now improved with abx and followed by ID. States he is getting good regrowth without worsening of the ulcer.     However, now starting to pick at new areas on the R leg as he decreases the prednisone.    Also has itchy spot on the L cheek, present for months      Disease history:  -Starting about 2013, he started to develop itchy bumps that enlarge when he scratches and picks at them. This initially affected just his lower legs, but more recently prior to initial presentation to our clinic, involvement spread to his thighs, buttocks, arms, and forearms.  -Areas start as tiny bumps that he then scratches. 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   -In spring 2018, an ulcer started on the L lateral calf and gradually enlarged, but was slowly healing and shrinking  by the time of presentation to our clinic. Denies any trauma at this site and notes that it started similar to other rashes on skin. Prior treatments include oral abx, including as recently as 12/2016 with courses of clinda and doxy, as well as topical steroids. He has h/o ulcerative colitis, now in remission without meds since 2015. Denies h/o liver or kidney disease or DM. Used to live in [place] when this started. Works for [employer] and is on feet all day, endorses swelling of legs. Denies new meds prior to onset.  - At his initial visit 01/22/2017, clobetasol ointment and doxycycline 100mg  bid x 4 weeks were started. A biopsy of a keratotic pruritic plaque was obtained. 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 Tissue culture grew 4+ MSSA. 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   - w/up for underlying pruritus (given patient report and difficulty not picking at the involved areas) on 10/17 revealed unremarkable HIV, hepatitis panel, LDH, TSH, free T4 . Outside slides from previous bx were reviewed by Dr. Eyvonne Left and showed venous stasis changes as well as a neutrophilic infiltrate that could be consistent with PG. He was started on topical clobetasol BID. He was continued on PO doxycycline for additional 6 weeks. His left leg ulcer continued to grow and the site of his previous biopsy on the posterior right leg has also developed a large non healing ulcer.   - Repeat biopsy as below done on 03/26/17 primarily c/w PG vs infectious with one potential organism on Fite stain but unclear if related.   - Patient started on prednisone on 04/01/17 with almost resolution by 7/19.  -Patient has recurrence of PG in the setting of MRSA - start process for humira however this was denied,   -started dapsone 50 mg on 9/19, increased to 100 mg on 9/19  Final Diagnosis   Date Value Ref Range Status   03/26/2017   Final    Left lateral shin, punch  - Suppurative inflammation with sinus tract formation, debris, and reactive changes. See comment.          Pertinent PMH:   Ulcerative colitis, in remission  HTN  DM   Denies h/o liver, kidney abnormalities     ROS: A balance of 10 systems was reviewed and negative.     PE:  General: Well-developed, well-nourished male in no acute distress, resting comfortably.  Neuro: Alert and oriented, answers questions appropriately.  Skin: Examination of face, neck, bilateral lower extremities, , , bilateral arms and hands was notable for:  - Left lateral lower leg with large ulcer with reepithelialized skin at the edges, ulcer is thinning  -a few crusted erosions and lichenified nodules on the R leg  stuck-on appearing tan/brown papules on the trunk and extremities and face, one with surr erythema      -All other areas examined were normal or had no significant findings.

## 2018-04-22 NOTE — Unmapped (Signed)
Cryosurgery  Cryosurgery (“freezing”) uses liquid nitrogen to destroy certain types of skin lesions. Lowering the temperature of the lesion in a small area surrounding skin destroys the lesion. Immediately following cryosurgery, you will notice redness and swelling of the treatment area. Blistering or weeping may occur, lasting approximately one week which will then be followed by crusting. Most areas will heal completely in 10 to 14 days.    Wash the treated areas daily. Allow soap and water to run over the areas, but do not scrub. Should a scab or crust form, allow it to fall off on its own. Do not remove or pick at it. Application of an ointment  and a bandage may make you feel more comfortable, but it is not necessary. Some people develop an allergy to Neosporin, so we recommend that Vaseline or  Aquaphor be used.    The cryotherapy site will be more sensitive than your surrounding skin. Keep it covered, and remember to apply sunscreen every day to all your sun exposed skin. A scar may remain which is lighter or pinker than your normal skin. Your body will continue to improve your scar for up to one year; however a light-colored scar may remain.    Infection following cryotherapy is rare. However if you are worried about the appearance of the treated area, contact your doctor. We have a physician on call at all times. If you have any concerns about the site, please call our clinic at [phone]

## 2018-04-27 DIAGNOSIS — Z720 Tobacco use: Secondary | ICD-10-CM

## 2018-04-27 MED ORDER — VARENICLINE TARTRATE 0.5 MG X 11 & 1 MG X 42 PO MISC: ORAL | 0 refills | Status: DC

## 2018-04-28 ENCOUNTER — Encounter: Payer: Self-pay | Admitting: Internal Medicine

## 2018-04-28 ENCOUNTER — Ambulatory Visit (INDEPENDENT_AMBULATORY_CARE_PROVIDER_SITE_OTHER): Payer: 59 | Admitting: Internal Medicine

## 2018-04-28 DIAGNOSIS — L88 Pyoderma gangrenosum: Secondary | ICD-10-CM

## 2018-04-28 NOTE — Progress Notes (Signed)
[facility]  Patient Active Problem List   Diagnosis Date Noted  . 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 Acute non-recurrent sinusitis 03/05/2018  . Ulcerative colitis (HCC) 03/05/2018  . Venous stasis 02/24/2018  . Chronic pain of left knee 12/05/2017  . Chronic pain of left ankle 12/05/2017  . Type 2 diabetes mellitus (HCC) 07/15/2017  . Lower extremity edema 02/20/2017  . Seasonal allergic rhinitis 11/25/2016  . Depression 04/17/2015  . Hyperlipidemia 04/17/2015    Patient's Medications  New Prescriptions   No medications on file  Previous Medications   ACETAMINOPHEN (TYLENOL) 500 MG TABLET    Take 1,000 mg by mouth every 6 (six) hours as needed for headache (pain).   CETIRIZINE (ZYRTEC) 10 MG TABLET    Take 1 tablet (10 mg total) by mouth daily.   CLOBETASOL OINTMENT (TEMOVATE) 0.05 %    Apply to wound once daily   DAPSONE 100 MG TABLET    Take 150 mg by mouth daily.    IBUPROFEN (ADVIL,MOTRIN) 200 MG TABLET    Take 400-600 mg by mouth every 6 (six) hours as needed for headache (pain).   METFORMIN (GLUCOPHAGE) 1000 MG TABLET    Take 1 tablet (1,000 mg total) by mouth 2 (two) times daily with a meal.   PREDNISONE (DELTASONE) 5 MG TABLET    5 mg daily with breakfast.    RANITIDINE (ZANTAC) 150 MG TABLET    Take 150 mg by mouth 2 (two) times daily as needed for heartburn (acid reflux).    VARENICLINE (CHANTIX STARTING MONTH PAK) 0.5 MG X 11 & 1 MG X 42 TABLET    Take 0.5 mg tablet by mouth once daily for 3 days, then increase to 0.5 mg twice daily for 4 days, then increase to 1 mg twice daily.   VENLAFAXINE XR (EFFEXOR-XR) 150 MG 24 HR CAPSULE    TAKE 1 CAPSULE BY MOUTH  DAILY WITH BREAKFAST  Modified Medications   No medications on file  Discontinued Medications   No medications on file    Subjective: [name] is in for his routine follow-up visit.  He feels like he is making slow progress since starting on dapsone for his  left leg ulcer.  He saw his dermatologist, Dr. Carles Collet, on 04/22/2018.  He is due to get blood work and then increase dapsone to 150 mg daily.  He plans to decrease his prednisone to 5 mg daily starting tonight.  Review of Systems: Review of Systems  Constitutional: Negative for chills, diaphoresis and fever.  Skin: Positive for itching.    Past Medical History:  Diagnosis Date  . Blood in stool   . Depression   . Elevated blood pressure   . Hyperlipidemia   . OSA (obstructive sleep apnea) 2014  . Seasonal allergies   . Ulcerative colitis (HCC) 03/05/2018    Social History   Tobacco Use  . Smoking status: Former Games developer  . Smokeless tobacco: Former Neurosurgeon    Quit date: 03/17/2015  . Tobacco comment: Quit in [place] smoker for 9 years  Substance Use Topics  . Alcohol use: Yes    Alcohol/week: 0.0 standard drinks    Comment: soical  . Drug use: Not on file    Family History  Problem Relation Age of Onset  . Alcohol abuse Paternal Aunt   . Alcohol abuse Paternal Uncle   . Stroke Paternal Uncle   . Hyperlipidemia Maternal  Grandfather   . Hypertension Maternal Grandfather   . Diabetes Maternal Grandfather   . Alcohol abuse Maternal Grandfather   . Multiple sclerosis Mother   . Dementia Mother     Allergies  Allergen Reactions  . Biaxin [Clarithromycin] Other (See Comments)    Causes colitis flares  . Milk-Related Compounds Hives    Objective: Vitals:   04/28/18 0941  BP: (!) 153/66  Pulse: (!) 106  Temp: 98 F (36.7 C)  Weight: (!) 358 lb (162.4 kg)   Body mass index is 48.55 kg/m.  Physical Exam  Constitutional: He is oriented to person, place, and time.  He is in good spirits.  Neurological: He is alert and oriented to person, place, and time.  Skin: Skin is dry. No rash noted.  His left leg ulcer is about the same size as on previous visits.  It appears that it may be starting to heal from the bottom up.  There is not as much yellow slough as  on previous visits.  There is no surrounding cellulitis.      Lab Results    Problem List Items Addressed This Visit      High   Pyoderma gangrenosum    He seems to be making slow progress since starting dapsone.  I do not think he has any component of active infection at this time and do not feel that he needs further antibiotic therapy.  He has scheduled follow-up with his dermatologist.  He can follow-up here as needed.          Cliffton AstersJohn Yvonda Fouty, MD [facility] [phone] pager   [phone] cell 04/28/2018, 10:04 AM

## 2018-04-28 NOTE — Assessment & Plan Note (Signed)
He seems to be making slow progress since starting dapsone.  I do not think he has any component of active infection at this time and do not feel that he needs further antibiotic therapy.  He has scheduled follow-up with his dermatologist.  He can follow-up here as needed.

## 2018-05-11 ENCOUNTER — Encounter: Payer: 59 | Admitting: Physician Assistant

## 2018-05-11 DIAGNOSIS — L88 Pyoderma gangrenosum: Secondary | ICD-10-CM | POA: Diagnosis not present

## 2018-05-11 DIAGNOSIS — L97225 Non-pressure chronic ulcer of left calf with muscle involvement without evidence of necrosis: Secondary | ICD-10-CM | POA: Diagnosis not present

## 2018-05-11 DIAGNOSIS — E11622 Type 2 diabetes mellitus with other skin ulcer: Secondary | ICD-10-CM | POA: Diagnosis not present

## 2018-05-15 DIAGNOSIS — Z79899 Other long term (current) drug therapy: Secondary | ICD-10-CM | POA: Diagnosis not present

## 2018-05-15 NOTE — Progress Notes (Signed)
BERT, PTACEK ([id number]) Visit Report for 05/11/2018 Chief Complaint Document Details Patient Name: [name] Date of Service: 05/11/2018 8:00 AM Medical Record Number: [id number] Patient Account Number: [id number] Date of Birth/Sex: 1981/02/15 (37 y.o. M) Treating RN: Harold Barban Primary Care Provider: [name] Other Clinician: Referring Provider: [name] Treating Provider/Extender: Melburn Hake, Lilyth Lawyer Weeks in Treatment: 42 Information Obtained from: Patient Chief Complaint He is here in follow up evaluation for LLE pyoderma ulcer Electronic Signature(s) Signed: 05/14/2018 10:16:39 PM By: Worthy Keeler PA-C Entered By: Worthy Keeler on 05/11/2018 08:18:16 JARAN, SAINZ ([id number]) -------------------------------------------------------------------------------- HPI Details Patient Name: [name] Date of Service: 05/11/2018 8:00 AM Medical Record Number: [id number] Patient Account Number: [id number] Date of Birth/Sex: 1981-03-12 (37 y.o. M) Treating RN: Harold Barban Primary Care Provider: [name] Other Clinician: Referring Provider: [name] Treating Provider/Extender: Melburn Hake, Maahir Horst Weeks in Treatment: 55 History of Present Illness HPI Description: 12/04/16; 37 year old man who comes into the clinic today for review of a wound on the posterior left calf. He tells me that is been there for about a year. He is not a diabetic he does smoke half a pack per day. He was seen in the ER on 11/20/16 felt to have cellulitis around the wound and was given clindamycin. An x-ray did not show osteomyelitis. The patient initially tells me that he has a milk allergy that sets off a pruritic itching rash on his lower legs which she scratches incessantly and he thinks that's what may have set up the wound. He has been using various topical antibiotics and ointments without any effect. He works in a [employer] and is on his feet all day. He does not  have a prior history of wounds however he does have the rash on both lower legs the right arm and the ventral aspect of his left arm. These are excoriations and clearly have had scratching however there are of macular looking areas on both legs including a substantial larger area on the right leg. This does not have an underlying open area. There is no blistering. The patient tells me that 2 years ago in [place] in response to the rash on his legs he saw a dermatologist who told him he had a condition which may be pyoderma gangrenosum although I may be putting words into his mouth. He seemed to recognize this. 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 He is not in any treatment for this. He's had no recent travel 12/11/16; the patient arrives today with his wound and roughly the same condition we've been using silver alginate this is a deep punched out wound with some surrounding erythema but no tenderness. Biopsy I did did not show confirmed pyoderma gangrenosum suggested nonspecific inflammation and vasculitis but does not provide an actual description of what was seen by the pathologist. I'm really not able to understand this We have also received information from the patient's dermatologist in [place] notes from April 2016. This was a doctor Agarwal- antal. 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 He was prescribed topical steroid high potency under occlusion which helped but at this point the patient did not have a deep punched out wound. 12/18/16; the patient's wound is larger in terms of surface area however this surface looks better and there is less depth. The surrounding erythema also is better. The patient states that the wrap we put on came off 2 days ago when he has been using  his compression stockings. He we are in the process of getting a dermatology consult. 12/26/16 on evaluation today patient's left lower extremity wound shows  evidence of infection with surrounding erythema noted. He has been tolerating the dressing changes but states that he has noted more discomfort. There is a larger area of erythema surrounding the wound. No fevers, chills, nausea, or vomiting noted at this time. With that being said the wound still does have slough covering the surface. He is not allergic to any medication that he is aware of at this point. In regard to his right lower extremity he had several regions that are erythematous and pruritic he wonders if there's anything we can do to help that. 01/02/17 I reviewed patient's wound culture which was obtained his visit last week. He was placed on doxycycline at that point. Unfortunately that does not appear to be an antibiotic that would likely help with the situation however the pseudomonas noted on culture is sensitive to Cipro. Also unfortunately patient's wound seems to have a large compared to last week's evaluation. Not severely so but there are definitely increased measurements in general. He is continuing to have discomfort as well he writes this to be a seven out of 10. In fact he would prefer me not to perform any debridement today due to the fact that he is having discomfort and considering he has an active infection on the little reluctant to do so anyway. No fevers, chills, nausea, or vomiting noted at this time. 01/08/17; patient seems dermatology on September 5. I suspect dermatology will want the slides from the biopsy I did sent to their pathologist. I'm not sure if there is a way we can expedite that. In any case the culture I did before I left on vacation 3 weeks ago showed Pseudomonas he was given 10 days of Cipro and per her description of her intake nurses is actually somewhat better this week although the wound is quite a bit bigger than I remember the last time I saw this. He still has 3 more days of Cipro MOUNIR, [name] ([id number]) 01/21/17; dermatology appointment  tomorrow. He has completed the ciprofloxacin for Pseudomonas. Surface of the wound looks better however he is had some deterioration in the lesions on his right leg. Meantime the left lateral leg wound we will continue with sample 01/29/17; patient had his dermatology appointment but I can't yet see that note. He is completed his antibiotics. The wound is more superficial but considerably larger in circumferential area than when he came in. This is in his left lateral calf. He also has swollen erythematous areas with superficial wounds on the right leg and small papular areas on both arms. There apparently areas in her his upper thighs and buttocks I did not look at those. Dermatology biopsied the right leg. Hopefully will have their input next week. 02/05/17; patient went back to see his dermatologist who told him that he had a "scratching problem" as well as staph. He is now on a 30 day course of doxycycline and I believe she gave him triamcinolone cream to the right leg areas to help with the itching [not exactly sure but probably triamcinolone]. She apparently looked at the left lateral leg wound although this was not rebiopsied and I think felt to be ultimately part of the same pathogenesis. He is using sample border foam and changing nevus himself. He now has a new open area on the right posterior leg which was his biopsy site I don't  have any of the dermatology notes 02/12/17; we put the patient in compression last week with SANTYL to the wound on the left leg and the biopsy. Edema is much better and the depth of the wound is now at level of skin. Area is still the same oBiopsy site on the right lateral leg we've also been using santyl with a border foam dressing and he is changing this himself. 02/19/17; Using silver alginate started last week to both the substantial left leg wound and the biopsy site on the right wound. He is tolerating compression well. Has a an appointment with his primary  M.D. tomorrow wondering about diuretics although I'm wondering if the edema problem is actually lymphedema 02/26/17; the patient has been to see his primary doctor Dr. Jerrel Ivory at [place] our primary care. She started him on Lasix 20 mg and this seems to have helped with the edema. However we are not making substantial change with the left lateral calf wound and inflammation. The biopsy site on the right leg also looks stable but not really all that different. 03/12/17; the patient has been to see vein and vascular Dr. Lucky Cowboy. He has had venous reflux studies I have not reviewed these. I did get a call from his dermatology office. 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 They therefore recommended clobetasol and tetracycline i.e. 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 03/26/17; apparently the patient just had reflux studies not an appointment with Dr. dew. She arrives in clinic today having applied clobetasol for 2-3 weeks. He notes over the last 2-3 days excessive drainage having to change the dressing 3-4 times a day and also expanding erythema. 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 Lillia Carmel at [facility] dermatology locally who kindly saw him at my request. 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 My concern was the degree of erythema medially extending from his left leg wound which was either  inflammation from pyoderma or cellulitis. I put him on Augmentin however culture of the wound showed Pseudomonas which is quinolone sensitive. I really don't believe he has cellulitis however in view of everything I will continue and give him a course of Cipro. He is also on doxycycline as an immune modulator for the pyoderma. In addition to his original wound on the left lateral leg with surrounding erythema he has a wound on the right posterior calf which was an original biopsy site done by dermatology. 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 Saw Dr. Lillia Carmel yesterday. He has been using topical antibiotics to both wound areas his original wound on the left and the biopsies/pathergy area on the right. There is definitely some improvement in the inflammation around the wound on the right although the patient states he has increasing sensitivity of the wounds. He is on prednisone 60 and doxycycline 1 as prescribed by Dr. Lillia Carmel. He is covering the topical  antibiotic with gauze and putting this in his own compression stocks and changing this daily. He states that Dr. Lottie Rater did a culture of the left leg wound yesterday 05/07/17; pyoderma gangrenosum. The patient saw Dr. Lillia Carmel yesterday and has a follow-up with her in one month. He is still using topical antibiotics to both wounds although he can't recall exactly what type. He is still on prednisone 60 mg. Dr. Lillia Carmel stated that the doxycycline could stop if we were in agreement. 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 The right medial leg was induced by biopsy/pathergy. The area on the right is essentially healed. 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 The right medial leg has  closed and remains closed. He is still on prednisone 60. oHe tells me he missed his last dermatology appointment with Dr. Lillia Carmel but will make another appointment. He reports that her blood sugar at a recent screen in [place] was high 200's. He was 180 today. He is more cushingoid blood pressure is Shaver, Caulin ([id number]) up a bit. I think he is going to require still much longer prednisone perhaps another 3 months before attempting to taper. In the meantime his wound is a lot better. Smaller. He is cleaning this off daily and applying topical antibiotics. When he was last in the clinic I thought about changing to [facility] and actually put in a couple of calls to dermatology although probably not during their business hours. 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 He continues on oral prednisone. He has been using triple antibiotic ointment. There is surface debris and we will transition to [facility] and have him return in 2 weeks. He has lost 30 pounds since his last appointment with lifestyle modification. He may benefit from topical steroid cream for treatment this can be considered at a later date. 08/22/17 on evaluation today patient appears to actually be doing rather well in regard to his left lateral lower extremity ulcer. He has actually been managed by Dr. Dellia Nims most recently. Patient is currently on oral steroids at this time. This seems to have been of benefit for him. Nonetheless his last visit was actually with Leah on 08/06/17. Currently he is not utilizing any topical steroid creams although this could be of benefit as well. No fevers, chills, nausea, or vomiting noted at this time. 09/05/17 on evaluation today patient appears to be doing better in regard to his left lateral lower extremity ulcer. He has been tolerating the dressing changes without  complication. He is using Santyl with good effect. Overall I'm very pleased with how things are standing at this point. Patient likewise is happy that this is doing better. 09/19/17 on evaluation today patient actually appears to be doing rather well in regard to his left lateral lower extremity ulcer. 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 He's not having any significant pain. 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 He has been tolerating the Santyl without complication and in general I feel like he's making good  progress. 10/17/17 on evaluation today patient appears to be doing very well in regard to his left lateral lower surety ulcer. He has been tolerating the dressing changes without complication. There does not appear to be any evidence of infection he's alternating the Santyl and the triple antibiotic ointment every other day this seems to be doing well for him. 11/03/17 on evaluation today patient appears to be doing very well in regard to his left lateral lower extremity ulcer. He is been tolerating the dressing changes without complication which is good news. Fortunately there does not appear to be any evidence of infection which is also great news. Overall is doing excellent they are starting to taper down on the prednisone is down to 40 mg at this point it also started topical clobetasol for him. 11/17/17 on evaluation today patient appears to be doing well in regard to his left lateral lower surety ulcer. He's been tolerating the dressing changes without complication. He does note that he is having no pain, no excessive drainage or discharge, and overall he feels like things are going about how he would expect and hope they would. Overall he seems to have no evidence of infection at this time in my  opinion which is good news. 12/04/17-He is seen in follow-up evaluation for right lateral lower extremity ulcer. He has been applying topical steroid cream. Today's measurement show slight increase in size. Over the next 2 weeks we will transition to every other day Santyl and steroid cream. He has been encouraged to monitor for changes and notify clinic with any concerns 12/15/17 on evaluation today patient's left lateral motion the ulcer and fortunately is doing worse again at this point. This just since last week to this week has close to doubled in size according to the patient. I did not seeing last week's I do not have a visual to compare this to in our system was also down so we do not have all the charts and at this point. Nonetheless it does have me somewhat concerned in regard to the fact that again he was worried enough about it he has contact the dermatology that placed them back on the full strength, 50 mg a day of the prednisone that he was taken previous. He continues to alternate using clobetasol along with Santyl at this point. He is obviously somewhat frustrated. 12/22/17 on evaluation today patient appears to be doing a little worse compared to last evaluation. Unfortunately the wound is a little deeper and slightly larger than the last week's evaluation. With that being said he has made some progress in regard to the irritation surrounding at this time unfortunately despite that progress that's been made he still has a significant issue going on here. 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 For that reason I discussed with him today the possibility of perform a wound culture to ensure there's nothing overtly infected. 01/06/18 on evaluation today patient's wound is larger and deeper than previously evaluated. With that being said it  did appear that his wound was infected after my last evaluation with him. Subsequently I did end up prescribing a prescription for Bactrim DS which she has been taking and having no complication with. Fortunately there does not appear to be any evidence of Maenza, Tj ([id number]) infection at this point in time as far as anything spreading, no want to  touch, and overall I feel like things are showing signs of improvement. 01/13/18 on evaluation today patient appears to be even a little larger and deeper than last time. There still muscle exposed in the base of the wound. Nonetheless he does appear to be less erythematous I do believe inflammation is calming down also believe the infection looks like it's probably resolved at this time based on what I'm seeing. No fevers, chills, nausea, or vomiting noted at this time. 01/30/18 on evaluation today patient actually appears to visually look better for the most part. Unfortunately those visually this looks better he does seem to potentially have what may be an abscess in the muscle that has been noted in the central portion of the wound. This is the first time that I have noted what appears to be fluctuance in the central portion of the muscle. With that being said I'm somewhat more concerned about the fact that this might indicate an abscess formation at this location. I do believe that an ultrasound would be appropriate. This is likely something we need to try to do as soon as possible. He has been switch to mupirocin ointment and he is no longer using the steroid ointment as prescribed by dermatology he sees them again next week he's been decreased from 60 to 40 mg of prednisone. 03/09/18 on evaluation today patient actually appears to be doing a little better compared to last time I saw him. There's not as much erythema surrounding the wound itself. He I did review his most recent infectious disease note which was dated 02/24/18. He saw Dr. Michel Bickers in [place]. 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 Patient is now off of antibiotics and they are continually observing this. There seems to be no change in the past two weeks in my pinion based on what the patient says and what I see today compared to what Dr. Megan Salon likely saw two weeks ago. No fevers, chills, nausea, or vomiting noted at this time. 03/23/18 on evaluation today patient's wound actually appears to be showing signs of improvement which is good news. He is currently still on the Dapsone. He is also working on tapering the prednisone to get off of this and Dr. Lottie Rater is working with him in this regard. Nonetheless overall I feel like the wound is doing well it does appear based on the infectious disease note that I reviewed from Dr. 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 I did also review the results of his ultrasound of left lower extremity which revealed there was a dentist tissue in the base of the wound without an abscess noted. 04/06/18 on evaluation today the patient's left lateral lower extremity ulcer actually appears to be doing fairly well which is excellent news. There does not appear to be any evidence of infection at this time which is also great news. Overall he still does have a significantly large ulceration although little by little he seems to be making progress. He is down to 10 mg a day of the prednisone. 04/20/18 on evaluation today patient actually appears to be doing excellent at this time in regard to his left lower extremity ulcer. He's making signs of good progress unfortunately this is taking much longer than we would really like  to see but nonetheless he is making progress. Fortunately there does not appear to be any evidence of  infection at this time. No fevers, chills, nausea, or vomiting noted at this time. The patient has not been using the Santyl due to the cost he hadn't got in this field yet. He's mainly been using the antibiotic ointment topically. Subsequently he also tells me that he really has not been scrubbing in the shower I think this would be helpful again as I told him it doesn't have to be anything too aggressive to even make it believe just enough to keep it free of some of the loose slough and biofilm on the wound surface. 05/11/18 on evaluation today patient's wound appears to be making slow but sure progress in regard to the left lateral lower extremity ulcer. He is been tolerating the dressing changes without complication. Fortunately there does not appear to be any evidence of infection at this time. He is still just using triple antibiotic ointment along with clobetasol occasionally over the area. He never got the Santyl and really does not seem to intend to in my pinion. Electronic Signature(s) Signed: 05/14/2018 10:16:39 PM By: Worthy Keeler PA-C Entered By: Worthy Keeler on 05/11/2018 11:51:08 KUTTER, SCHNEPF ([id number]) -------------------------------------------------------------------------------- Physical Exam Details Patient Name: [name] Date of Service: 05/11/2018 8:00 AM Medical Record Number: [id number] Patient Account Number: [id number] Date of Birth/Sex: 09/14/1980 (37 y.o. M) Treating RN: Harold Barban Primary Care Provider: [name] Other Clinician: Referring Provider: [name] Treating Provider/Extender: Melburn Hake, Nastacia Raybuck Weeks in Treatment: 80 Constitutional Well-nourished and well-hydrated in no acute distress. Respiratory normal breathing without difficulty. clear to auscultation bilaterally. Cardiovascular regular rate and rhythm with normal S1, S2. Psychiatric this patient is able to make decisions and demonstrates good insight into disease  process. Alert and Oriented x 3. pleasant and cooperative. Notes Patient's wound currently did have some Slough noted on the surface of the wound bed which was able to be gently removed without sharp debridement. Again this is something that he really does not want [place] to sharply debride due to the fact that he is been told by dermatology not to allow anyone to debride the pirate. Again I agree with the inflammatory process aggressive debridement could be somewhat detrimental but nonetheless liked debridement could be helpful as well either way we are avoiding sharp debridement at this point per patient request. Electronic Signature(s) Signed: 05/14/2018 10:16:39 PM By: Worthy Keeler PA-C Entered By: Worthy Keeler on 05/11/2018 11:51:48 [name] ([id number]) -------------------------------------------------------------------------------- Physician Orders Details Patient Name: [name] Date of Service: 05/11/2018 8:00 AM Medical Record Number: [id number] Patient Account Number: [id number] Date of Birth/Sex: 04/24/1981 (37 y.o. 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 Anesthetic (add to Medication List) Wound #1 Left,Lateral Lower Leg o Topical Lidocaine 4% cream applied to wound bed prior to debridement (In Clinic Only). Primary Wound Dressing Wound #1 Left,Lateral Lower Leg o Other: - Triple antibiotic ointment Secondary Dressing Wound #1 Left,Lateral Lower Leg o Boardered Foam  Dressing Dressing Change Frequency Wound #1 Left,Lateral Lower Leg o Change dressing every  day. Follow-up Appointments Wound #1 Left,Lateral Lower Leg o Return Appointment in 3 weeks. Edema Control Wound #1 Left,Lateral Lower Leg o Patient to wear own compression stockings o Elevate legs to the level of the heart and pump ankles as often as possible Medications-please add to medication list. Wound #1 Left,Lateral Lower Leg o Other: - Continue steroids MATHIS, CASHMAN ([id number]) Electronic Signature(s) Signed: 05/12/2018 1:37:43 PM By: Harold Barban Signed: 05/14/2018 10:16:39 PM By: Worthy Keeler PA-C Entered By: Harold Barban on 05/11/2018 08:31:58 [name] ([id number]) -------------------------------------------------------------------------------- Problem List Details Patient Name: [name] Date of Service: 05/11/2018 8:00 AM Medical Record Number: [id number] Patient Account Number: [id number] Date of Birth/Sex: 1980/08/18 (37 y.o. M) Treating RN: Harold Barban Primary Care Provider: [name] Other Clinician: Referring Provider: [name] Treating Provider/Extender: Melburn Hake, Jomayra Novitsky Weeks in Treatment: 49 Active Problems ICD-10 Evaluated Encounter Code Description Active Date Today Diagnosis L97.222 Non-pressure chronic ulcer of left calf with fat layer exposed 12/04/2016 No Yes L88 Pyoderma gangrenosum 03/26/2017 No Yes I87.2 Venous insufficiency (chronic) (peripheral) 12/04/2016 No Yes Inactive Problems ICD-10 Code Description Active Date Inactive Date L97.213 Non-pressure chronic ulcer of right calf with necrosis of muscle 04/02/2017 04/02/2017 Resolved Problems Electronic Signature(s) Signed: 05/14/2018 10:16:39 PM By: Worthy Keeler PA-C Entered By: Worthy Keeler on 05/11/2018 08:17:53 Schorsch, Arwin ([id number]) -------------------------------------------------------------------------------- Progress Note Details Patient  Name: [name] Date of Service: 05/11/2018 8:00 AM Medical Record Number: [id number] Patient Account Number: [id number] Date of Birth/Sex: [birth year] (37 y.o. M) Treating RN: Harold Barban Primary Care Provider: [name] Other Clinician: Referring Provider: [name] Treating Provider/Extender: Melburn Hake, Orlando Thalmann Weeks in Treatment: 74 Subjective Chief Complaint Information obtained from Patient He is here in follow up evaluation for LLE pyoderma ulcer History of Present Illness (HPI) 12/04/16; 37 year old man who comes into the clinic today for review of a wound on the posterior left calf. He tells me that is been there for about a year. He is not a diabetic he does smoke half a pack per day. He was seen in the ER on 11/20/16 felt to have cellulitis around the wound and was given clindamycin. An x-ray did not show osteomyelitis. The patient initially tells me that he has a milk allergy that sets off a pruritic itching rash on his lower legs which she scratches incessantly and he thinks that's what may have set up the wound. He has been using various topical antibiotics and ointments without any effect. He works in a [employer] and is on his feet all day. He does not have a prior history of wounds however he does have the rash on both lower legs the right arm and the ventral aspect of his left arm. These are excoriations and clearly have had scratching however there are of macular looking areas on both legs including a substantial larger area on the right leg. This does not have an underlying open area. There is no blistering. The patient tells me that 2 years ago in [place] in response to the rash on his legs he saw a dermatologist who told him he had a condition which may be pyoderma gangrenosum although I may be putting words into his mouth. He seemed to recognize this. 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 He is not in any  treatment for this. He's had no recent travel 12/11/16; the patient arrives today with his wound and roughly the same condition we've been using silver alginate this is  a deep punched out wound with some surrounding erythema but no tenderness. Biopsy I did did not show confirmed pyoderma gangrenosum suggested nonspecific inflammation and vasculitis but does not provide an actual description of what was seen by the pathologist. I'm really not able to understand this We have also received information from the patient's dermatologist in [place] notes from April 2016. This was a doctor Agarwal- antal. 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 He was prescribed topical steroid high potency under occlusion which helped but at this point the patient did not have a deep punched out wound. 12/18/16; the patient's wound is larger in terms of surface area however this surface looks better and there is less depth. The surrounding erythema also is better. The patient states that the wrap we put on came off 2 days ago when he has been using his compression stockings. He we are in the process of getting a dermatology consult. 12/26/16 on evaluation today patient's left lower extremity wound shows evidence of infection with surrounding erythema noted. He has been tolerating the dressing changes but states that he has noted more discomfort. There is a larger area of erythema surrounding the wound. No fevers, chills, nausea, or vomiting noted at this time. With that being said the wound still does have slough covering the surface. He is not allergic to any medication that he is aware of at this point. In regard to his right lower extremity he had several regions that are erythematous and pruritic he wonders if there's anything we can do to help that. 01/02/17 I reviewed patient's wound culture which was obtained his visit last week. He was placed on doxycycline at that point. Unfortunately that does not  appear to be an antibiotic that would likely help with the situation however the pseudomonas noted on culture is sensitive to Cipro. Also unfortunately patient's wound seems to have a large compared to last week's evaluation. Not severely so but there are definitely increased measurements in general. He is continuing to have discomfort as well he writes this to be a seven out of 10. In fact he would prefer me not to perform any debridement today due to the fact that he is having discomfort and considering he has an active infection on the little reluctant to do so anyway. No fevers, Dory, Breck ([id number]) chills, nausea, or vomiting noted at this time. 01/08/17; patient seems dermatology on September 5. I suspect dermatology will want the slides from the biopsy I did sent to their pathologist. I'm not sure if there is a way we can expedite that. In any case the culture I did before I left on vacation 3 weeks ago showed Pseudomonas he was given 10 days of Cipro and per her description of her intake nurses is actually somewhat better this week although the wound is quite a bit bigger than I remember the last time I saw this. He still has 3 more days of Cipro 01/21/17; dermatology appointment tomorrow. He has completed the ciprofloxacin for Pseudomonas. Surface of the wound looks better however he is had some deterioration in the lesions on his right leg. Meantime the left lateral leg wound we will continue with sample 01/29/17; patient had his dermatology appointment but I can't yet see that note. He is completed his antibiotics. The wound is more superficial but considerably larger in circumferential area than when he came in. This is in his left lateral calf. He also has swollen erythematous areas with superficial wounds on the  right leg and small papular areas on both arms. There apparently areas in her his upper thighs and buttocks I did not look at those. Dermatology biopsied the right leg.  Hopefully will have their input next week. 02/05/17; patient went back to see his dermatologist who told him that he had a "scratching problem" as well as staph. He is now on a 30 day course of doxycycline and I believe she gave him triamcinolone cream to the right leg areas to help with the itching [not exactly sure but probably triamcinolone]. She apparently looked at the left lateral leg wound although this was not rebiopsied and I think felt to be ultimately part of the same pathogenesis. He is using sample border foam and changing nevus himself. He now has a new open area on the right posterior leg which was his biopsy site I don't have any of the dermatology notes 02/12/17; we put the patient in compression last week with SANTYL to the wound on the left leg and the biopsy. Edema is much better and the depth of the wound is now at level of skin. Area is still the same Biopsy site on the right lateral leg we've also been using santyl with a border foam dressing and he is changing this himself. 02/19/17; Using silver alginate started last week to both the substantial left leg wound and the biopsy site on the right wound. He is tolerating compression well. Has a an appointment with his primary M.D. tomorrow wondering about diuretics although I'm wondering if the edema problem is actually lymphedema 02/26/17; the patient has been to see his primary doctor Dr. Jerrel Ivory at [place] our primary care. She started him on Lasix 20 mg and this seems to have helped with the edema. However we are not making substantial change with the left lateral calf wound and inflammation. The biopsy site on the right leg also looks stable but not really all that different. 03/12/17; the patient has been to see vein and vascular Dr. Lucky Cowboy. He has had venous reflux studies I have not reviewed these. I did get a call from his dermatology office. They felt that he might have pathergy based on their biopsy on his right  leg which led them to look at the slides of the biopsy I did on the left leg and they wonder whether this represents pyoderma gangrenosum which was the original supposition in a man with ulcerative colitis albeit inactive for many years. They therefore recommended clobetasol and tetracycline i.e. 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 03/26/17; apparently the patient just had reflux studies not an appointment with Dr. dew. She arrives in clinic today having applied clobetasol for 2-3 weeks. He notes over the last 2-3 days excessive drainage having to change the dressing 3-4 times a day and also expanding erythema. 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 Lillia Carmel at [facility] dermatology locally who kindly saw him at my request. 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 My concern was the degree of erythema medially extending from his left leg wound which was either inflammation from pyoderma or cellulitis. I put him on Augmentin however culture of the wound showed Pseudomonas which is quinolone sensitive. I  really don't believe he has cellulitis however in view of everything I will continue and give him a course of Cipro. He is also on doxycycline as an immune modulator for the pyoderma. In addition to his original wound on the left lateral leg with surrounding erythema he has a wound on the right posterior calf which was an original biopsy site done by dermatology. 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 Saw Dr. Lillia Carmel yesterday. He has been using topical antibiotics to both wound areas his original wound on  the left and the biopsies/pathergy area on the right. There is definitely some improvement in the inflammation around the wound on the right although the patient states he has increasing sensitivity of the wounds. He is on prednisone 60 and doxycycline 1 as prescribed by Dr. Lillia Carmel. He is covering the topical antibiotic with gauze and putting this in his own compression stocks and changing this daily. He states that Dr. Lottie Rater did a culture of the left leg wound yesterday 05/07/17; pyoderma gangrenosum. The patient saw Dr. Lillia Carmel yesterday and has a follow-up with her in one month. He is still using topical antibiotics to both wounds although he can't recall exactly what type. He is still on prednisone 60 mg. Dr. Lillia Carmel stated that the doxycycline could stop if we were in agreement. 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 The right medial leg was induced by [name] ([id number]) biopsy/pathergy. The area on the right is essentially healed. 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 The right medial leg has closed and remains closed. He is still on prednisone 60. He tells me he missed his last dermatology appointment with Dr. Lillia Carmel but will make another appointment. He reports that her blood sugar at a recent screen in [place] was high 200's. He was 180 today. 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 I think he is going to require still much longer prednisone perhaps another 3 months before attempting to taper. In the meantime his wound is a lot better. Smaller. He is cleaning this off daily and applying topical antibiotics. When he was last in the clinic I thought about changing to [facility] and actually put in a couple of calls to dermatology although probably not  during their business hours. 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 He continues on oral prednisone. He has been using triple antibiotic ointment. There is surface debris and we will transition to [facility] and have him return in 2 weeks. He has lost 30 pounds since his last appointment with lifestyle modification. He may benefit from topical steroid cream for treatment this can be considered at a later date. 08/22/17 on evaluation today patient appears to actually be doing rather well in regard to his left lateral lower extremity ulcer. He has actually been managed by Dr. Dellia Nims most recently. Patient is currently on oral steroids at this time. This seems to have been of benefit for him. Nonetheless his last visit was actually with Leah on 08/06/17. Currently he is not utilizing any topical steroid creams although this could be of benefit as well. No fevers, chills, nausea, or vomiting noted at  this time. 09/05/17 on evaluation today patient appears to be doing better in regard to his left lateral lower extremity ulcer. He has been tolerating the dressing changes without complication. He is using Santyl with good effect. Overall I'm very pleased with how things are standing at this point. Patient likewise is happy that this is doing better. 09/19/17 on evaluation today patient actually appears to be doing rather well in regard to his left lateral lower extremity ulcer. 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 He's not having any significant pain. 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 He has been tolerating the Santyl without complication and in general I feel like he's making  good progress. 10/17/17 on evaluation today patient appears to be doing very well in regard to his left lateral lower surety ulcer. He has been tolerating the dressing changes without complication. There does not appear to be any evidence of infection he's alternating the Santyl and the triple antibiotic ointment every other day this seems to be doing well for him. 11/03/17 on evaluation today patient appears to be doing very well in regard to his left lateral lower extremity ulcer. He is been tolerating the dressing changes without complication which is good news. Fortunately there does not appear to be any evidence of infection which is also great news. Overall is doing excellent they are starting to taper down on the prednisone is down to 40 mg at this point it also started topical clobetasol for him. 11/17/17 on evaluation today patient appears to be doing well in regard to his left lateral lower surety ulcer. He's been tolerating the dressing changes without complication. He does note that he is having no pain, no excessive drainage or discharge, and overall he feels like things are going about how he would expect and hope they would. Overall he seems to have no evidence of infection at this time in my opinion which is good news. 12/04/17-He is seen in follow-up evaluation for right lateral lower extremity ulcer. He has been applying topical steroid cream. Today's measurement show slight increase in size. Over the next 2 weeks we will transition to every other day Santyl and steroid cream. He has been encouraged to monitor for changes and notify clinic with any concerns 12/15/17 on evaluation today patient's left lateral motion the ulcer and fortunately is doing worse again at this point. This just since last week to this week has close to doubled in size according to the patient. I did not seeing last week's I do not have a visual to compare this to in our system was also down so we do not have all  the charts and at this point. Nonetheless it does have me somewhat concerned in regard to the fact that again he was worried enough about it he has contact the dermatology that placed them back on the full strength, 50 mg a day of the prednisone that he was taken previous. He continues to alternate using clobetasol along with Santyl at this point. He is obviously somewhat frustrated. 12/22/17 on evaluation today patient appears to be doing a little worse compared to last evaluation. Unfortunately the wound is a little deeper and slightly larger than the last week's evaluation. With that being said he has made some progress in regard to the irritation surrounding at this  time unfortunately despite that progress that's been made he still has a significant issue going on here. I'm not certain that he is having really any true infection at this time although with the Pyoderma gangrenosum it can Kurtz, Viren ([id number]) sometimes be difficult to differentiate infection versus just inflammation. For that reason I discussed with him today the possibility of perform a wound culture to ensure there's nothing overtly infected. 01/06/18 on evaluation today patient's wound is larger and deeper than previously evaluated. With that being said it did appear that his wound was infected after my last evaluation with him. Subsequently I did end up prescribing a prescription for Bactrim DS which she has been taking and having no complication with. Fortunately there does not appear to be any evidence of infection at this point in time as far as anything spreading, no want to touch, and overall I feel like things are showing signs of improvement. 01/13/18 on evaluation today patient appears to be even a little larger and deeper than last time. There still muscle exposed in the base of the wound. Nonetheless he does appear to be less erythematous I do believe inflammation is calming down also believe the infection looks  like it's probably resolved at this time based on what I'm seeing. No fevers, chills, nausea, or vomiting noted at this time. 01/30/18 on evaluation today patient actually appears to visually look better for the most part. Unfortunately those visually this looks better he does seem to potentially have what may be an abscess in the muscle that has been noted in the central portion of the wound. This is the first time that I have noted what appears to be fluctuance in the central portion of the muscle. With that being said I'm somewhat more concerned about the fact that this might indicate an abscess formation at this location. I do believe that an ultrasound would be appropriate. This is likely something we need to try to do as soon as possible. He has been switch to mupirocin ointment and he is no longer using the steroid ointment as prescribed by dermatology he sees them again next week he's been decreased from 60 to 40 mg of prednisone. 03/09/18 on evaluation today patient actually appears to be doing a little better compared to last time I saw him. There's not as much erythema surrounding the wound itself. He I did review his most recent infectious disease note which was dated 02/24/18. He saw Dr. Michel Bickers in [place]. 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 Patient is now off of antibiotics and they are continually observing this. There seems to be no change in the past two weeks in my pinion based on what the patient says and what I see today compared to what Dr. Megan Salon likely saw two weeks ago. No fevers, chills, nausea, or vomiting noted at this time. 03/23/18 on evaluation today patient's wound actually appears to be showing signs of improvement which is good news. He is currently still on the Dapsone. He is also working on tapering the prednisone to get off of this and Dr. Lottie Rater is working  with him in this regard. Nonetheless overall I feel like the wound is doing well it does appear based on the infectious disease note that I reviewed from Dr. 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 I did also review the results of his ultrasound of left lower extremity which revealed there was a dentist tissue in the base of the wound without an abscess noted. 04/06/18 on evaluation today the patient's left lateral lower extremity ulcer actually appears to be doing fairly well which is excellent news. There does not appear to be any evidence of infection at this time which is also great news. Overall he still does have a significantly large ulceration although little by little he seems to be making progress. He is down to 10 mg a day of the prednisone. 04/20/18 on evaluation today patient actually appears to be doing excellent at this time in regard to his left lower extremity ulcer. He's making signs of good progress unfortunately this is taking much longer than we would really like to see but nonetheless he is making progress. Fortunately there does not appear to be any evidence of infection at this time. No fevers, chills, nausea, or vomiting noted at this time. The patient has not been using the Santyl due to the cost he hadn't got in this field yet. He's mainly been using the antibiotic ointment topically. Subsequently he also tells me that he really has not been scrubbing in the shower I think this would be helpful again as I told him it doesn't have to be anything too aggressive to even make it believe just enough to keep it free of some of the loose slough and biofilm on the wound surface. 05/11/18 on evaluation today patient's wound appears to be making slow but sure progress in regard to the left lateral lower extremity ulcer. He is been tolerating the dressing changes  without complication. Fortunately there does not appear to be any evidence of infection at this time. He is still just using triple antibiotic ointment along with clobetasol occasionally over the area. He never got the Santyl and really does not seem to intend to in my pinion. Patient History Information obtained from Patient. Family History Diabetes - Mother,Maternal Grandparents, Heart Disease - Mother,Maternal Grandparents, Hereditary Spherocytosis - Anger, Steel ([id number]) Siblings, Hypertension - Maternal Grandparents, Stroke - Siblings, No family history of Cancer, Kidney Disease, Lung Disease, Seizures, Thyroid Problems, Tuberculosis. Social History Current some day smoker, Marital Status - Married, Alcohol Use - Rarely, Drug Use - No History, Caffeine Use - Moderate. Review of Systems (ROS) Constitutional Symptoms (General Health) Denies complaints or symptoms of Fever, Chills. Respiratory The patient has no complaints or symptoms. Cardiovascular The patient has no complaints or symptoms. Psychiatric The patient has no complaints or symptoms. Objective Constitutional Well-nourished and well-hydrated in no acute distress. Vitals Time Taken: 8:09 AM, Height: 71 in, Weight: 338 lbs, BMI: 47.1, Temperature: 98.2 F, Pulse: 81 bpm, Respiratory Rate: 18 breaths/min, Blood Pressure: 137/70 mmHg. Respiratory normal breathing without difficulty. clear to auscultation bilaterally. Cardiovascular regular rate and rhythm with normal S1, S2. Psychiatric this patient is able to make decisions and demonstrates good insight into disease process. Alert and Oriented x 3. pleasant and cooperative. General Notes: Patient's wound currently did have some Slough noted on the surface of the wound bed which was able to be gently removed without sharp debridement. Again this is something that he really does not want [place] to sharply debride due to the fact that he is been told by dermatology not to  allow anyone to debride the pirate. Again I agree with the inflammatory process aggressive debridement could be somewhat detrimental but  nonetheless liked debridement could be helpful as well either way we are avoiding sharp debridement at this point per patient request. Integumentary (Hair, Skin) Wound #1 status is Open. Original cause of wound was Gradually Appeared. The wound is located on the Left,Lateral Lower Leg. The wound measures 2.7cm length x 3.5cm width x 0.5cm depth; 7.422cm^2 area and 3.711cm^3 volume. There is muscle and Fat Layer (Subcutaneous Tissue) Exposed exposed. There is no tunneling or undermining noted. There is a large amount of serous drainage noted. The wound margin is distinct with the outline attached to the wound base. There is small (1-33%) red, pink granulation within the wound bed. There is a large (67-100%) amount of necrotic tissue within the wound bed including Adherent Slough. The periwound skin appearance exhibited: Induration, Erythema. The periwound skin appearance did not exhibit: Callus, Crepitus, Excoriation, Rash, Scarring, Dry/Scaly, Maceration, Atrophie Blanche, Cyanosis, Ecchymosis, Hemosiderin Staining, Mottled, Pallor, Rubor. The surrounding wound skin color is noted with erythema which is [place], Lyric ([id number]) circumferential. Periwound temperature was noted as No Abnormality. The periwound has tenderness on palpation. 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 Anesthetic (add to Medication List): Wound #1 Left,Lateral Lower Leg: Topical Lidocaine 4% cream applied to wound bed prior to debridement (In Clinic Only). Primary Wound Dressing: Wound #1 Left,Lateral Lower Leg: Other: - Triple antibiotic  ointment Secondary Dressing: Wound #1 Left,Lateral Lower Leg: Boardered Foam Dressing Dressing Change Frequency: Wound #1 Left,Lateral Lower Leg: Change dressing every day. Follow-up Appointments: Wound #1 Left,Lateral Lower Leg: Return Appointment in 3 weeks. Edema Control: Wound #1 Left,Lateral Lower Leg: Patient to wear own compression stockings Elevate legs to the level of the heart and pump ankles as often as possible Medications-please add to medication list.: Wound #1 Left,Lateral Lower Leg: Other: - Continue steroids My suggestion at this point is gonna be that we continue with the above wound care measures. The patient is in agreement with plan. If anything changes or worsens in the meantime he will contact the office and let us know. Please see above for specific wound care orders. We will see patient for re-evaluation in 3 week(s) here in the clinic. If anything worsens or changes patient will contact our office for additional recommendations. Electronic Signature(s) SUHAAS, AGENA ([id number]) Signed: 05/14/2018 10:16:39 PM By: Worthy Keeler PA-C Entered By: Worthy Keeler on 05/11/2018 11:52:07 JOSHUS, ROGAN ([id number]) -------------------------------------------------------------------------------- ROS/PFSH Details Patient Name: [name] Date of Service: 05/11/2018 8:00 AM Medical Record Number: [id number] Patient Account Number: [id number] Date of Birth/Sex: 03/14/1981 (37 y.o. M) Treating RN: Harold Barban Primary Care Provider: [name] Other Clinician: Referring Provider: [name] Treating Provider/Extender: Melburn Hake, Berthel Bagnall Weeks in Treatment: 12 Information Obtained From Patient Wound History Do you currently have one or more open woundso Yes How many open wounds do you currently haveo 1 Approximately how long have you had your woundso 1 year How have you been treating your wound(s) until nowo neosporin Has your wound(s) ever healed  and then re-openedo No Have you had any lab work done in the past montho No Have you tested positive for an antibiotic resistant organism (MRSA, VRE)o No Have you tested positive for osteomyelitis (bone infection)o No Have you had any tests for circulation on your legso No Have you had other problems associated with your woundso Infection, Swelling  Constitutional Symptoms (General Health) Complaints and Symptoms: Negative for: Fever; Chills Eyes Medical History: Negative for: Cataracts; Glaucoma; Optic Neuritis Ear/Nose/Mouth/Throat Medical History: Negative for: Chronic sinus problems/congestion; Middle ear problems Hematologic/Lymphatic Medical History: Negative for: Anemia; Hemophilia; Human Immunodeficiency Virus; Lymphedema; Sickle Cell Disease Respiratory Complaints and Symptoms: No Complaints or Symptoms Medical History: Positive for: Sleep Apnea - cpap Negative for: Aspiration; Asthma; Chronic Obstructive Pulmonary Disease (COPD); Pneumothorax Cardiovascular Complaints and Symptoms: No Complaints or Symptoms Medical HistoryCOLONEL, KRAUSER ([id number]) Positive for: Hypertension Negative for: Angina; Arrhythmia; Congestive Heart Failure; Coronary Artery Disease; Deep Vein Thrombosis; Hypotension; Myocardial Infarction; Peripheral Arterial Disease; Peripheral Venous Disease; Phlebitis; Vasculitis Gastrointestinal Medical History: Positive for: Colitis Endocrine Medical History: Negative for: Type I Diabetes; Type II Diabetes Genitourinary Medical History: Negative for: End Stage Renal Disease Immunological Medical History: Negative for: Lupus Erythematosus; Raynaudos; Scleroderma Integumentary (Skin) Medical History: Negative for: History of Burn; History of pressure wounds Musculoskeletal Medical History: Negative for: Gout; Rheumatoid Arthritis; Osteoarthritis; Osteomyelitis Neurologic Medical History: Negative for: Dementia; Neuropathy; Quadriplegia;  Paraplegia; Seizure Disorder Oncologic Medical History: Negative for: Received Chemotherapy; Received Radiation Psychiatric Complaints and Symptoms: No Complaints or Symptoms Medical History: Negative for: Anorexia/bulimia; Confinement Anxiety Immunizations Pneumococcal Vaccine: Received Pneumococcal Vaccination: No Implantable Devices Family and Social History BETTIE, SWAVELY ([id number]) Cancer: No; Diabetes: Yes - Mother,Maternal Grandparents; Heart Disease: Yes - Mother,Maternal Grandparents; Hereditary Spherocytosis: Yes - Siblings; Hypertension: Yes - Maternal Grandparents; Kidney Disease: No; Lung Disease: No; Seizures: No; Stroke: Yes - Siblings; Thyroid Problems: No; Tuberculosis: No; Current some day smoker; Marital Status - Married; Alcohol Use: Rarely; Drug Use: No History; Caffeine Use: Moderate; Financial Concerns: No; Food, Clothing or Shelter Needs: No; Support System Lacking: No; Transportation Concerns: No; Advanced Directives: No; Patient does not want information on Advanced Directives; Living Will: No Physician Affirmation I have reviewed and agree with the above information. Electronic Signature(s) Signed: 05/12/2018 1:37:43 PM By: Harold Barban Signed: 05/14/2018 10:16:39 PM By: Worthy Keeler PA-C Entered By: Worthy Keeler on 05/11/2018 11:51:22 VELMER, WOELFEL ([id number]) -------------------------------------------------------------------------------- SuperBill Details Patient Name: [name] Date of Service: 05/11/2018 Medical Record Number: [id number] Patient Account Number: [id number] Date of Birth/Sex: April 08, 1981 (37 y.o. M) Treating RN: Harold Barban Primary Care Provider: [name] Other Clinician: Referring Provider: [name] Treating Provider/Extender: Melburn Hake, Willodean Leven Weeks in Treatment: 4 Diagnosis Coding ICD-10 Codes Code Description [phone] Non-pressure chronic ulcer of left calf with fat layer exposed L88 Pyoderma  gangrenosum I87.2 Venous insufficiency (chronic) (peripheral) Facility Procedures CPT4 Code: 71696789 Description: 38101 - WOUND CARE VISIT-LEV 3 EST PT Modifier: Quantity: 1 Physician Procedures CPT4 Code: [id number] Description: 52778 - WC PHYS LEVEL 4 - EST PT ICD-10 Diagnosis Description L97.222 Non-pressure chronic ulcer of left calf with fat layer expo L88 Pyoderma gangrenosum I87.2 Venous insufficiency (chronic) (peripheral) Modifier: sed Quantity: 1 Electronic Signature(s) Signed: 05/14/2018 10:16:39 PM By: Worthy Keeler PA-C Entered By: Worthy Keeler on 05/11/2018 11:52:42

## 2018-05-15 NOTE — Progress Notes (Signed)
MECCA, BARGA ([id number]) Visit Report for 05/11/2018 Arrival Information Details Patient Name: [name], [name] Date of Service: 05/11/2018 8:00 AM Medical Record Number: [id number] Patient Account Number: [id number] Date of Birth/Sex: Jan 29, 1981 (37 y.o. M) Treating RN: Montey Hora Primary Care Otho Michalik: [name] Other Clinician: Referring Kessler Solly: [name] Treating Asta Corbridge/Extender: Melburn Hake, [name]: 61 Visit Information History Since Last Visit Added or deleted any medications: No Patient Arrived: Ambulatory Any new allergies or adverse reactions: No Arrival Time: 08:08 Had a fall or experienced change in No Accompanied By: self activities of daily living that may affect Transfer Assistance: None risk of falls: Patient Identification Verified: Yes Signs or symptoms of abuse/neglect since last visito No Secondary Verification Process Completed: Yes Hospitalized since last visit: No Patient Requires Transmission-Based No Implantable device outside of the clinic excluding No Precautions: cellular tissue based products placed in the center Patient Has Alerts: No since last visit: Has Dressing in Place as Prescribed: Yes Has Compression in Place as Prescribed: Yes Pain Present Now: No Electronic Signature(s) Signed: 05/11/2018 4:55:08 PM By: Montey Hora Entered By: Montey Hora on 05/11/2018 08:09:22 [name] ([id number]) -------------------------------------------------------------------------------- Clinic Level of Care Assessment Details Patient Name: [name] Date of Service: 05/11/2018 8:00 AM Medical Record Number: [id number] Patient Account Number: [id number] Date of Birth/Sex: 02/26/1981 (37 y.o. M) Treating RN: Harold Barban Primary Care Renly Roots: [name] Other Clinician: Referring Cohen Boettner: [name] Treating Reise Gladney/Extender: Melburn Hake, [name]: 76 Clinic Level of Care Assessment  Items TOOL 4 Quantity Score []  - Use when only an EandM is performed on FOLLOW-UP visit 0 ASSESSMENTS - Nursing Assessment / Reassessment X - Reassessment of Co-morbidities (includes updates in patient status) 1 10 X- 1 5 Reassessment of Adherence to Treatment Plan ASSESSMENTS - Wound and Skin Assessment / Reassessment X - Simple Wound Assessment / Reassessment - one wound 1 5 []  - 0 Complex Wound Assessment / Reassessment - multiple wounds []  - 0 Dermatologic / Skin Assessment (not related to wound area) ASSESSMENTS - Focused Assessment []  - Circumferential Edema Measurements - multi extremities 0 []  - 0 Nutritional Assessment / Counseling / Intervention []  - 0 Lower Extremity Assessment (monofilament, tuning fork, pulses) []  - 0 Peripheral Arterial Disease Assessment (using hand held doppler) ASSESSMENTS - Ostomy and/or Continence Assessment and Care []  - Incontinence Assessment and Management 0 []  - 0 Ostomy Care Assessment and Management (repouching, etc.) PROCESS - Coordination of Care X - Simple Patient / Family Education for ongoing care 1 15 []  - 0 Complex (extensive) Patient / Family Education for ongoing care X- 1 10 Staff obtains Programmer, systems, Records, Test Results / Process Orders []  - 0 Staff telephones HHA, Nursing Homes / Clarify orders / etc []  - 0 Routine Transfer to another Facility (non-emergent condition) []  - 0 Routine Hospital Admission (non-emergent condition) []  - 0 New Admissions / Biomedical engineer / Ordering NPWT, Apligraf, etc. []  - 0 Emergency Hospital Admission (emergent condition) X- 1 10 Simple Discharge Coordination [name], [name] ([id number]) []  - 0 Complex (extensive) Discharge Coordination PROCESS - Special Needs []  - Pediatric / Minor Patient Management 0 []  - 0 Isolation Patient Management []  - 0 Hearing / Language / Visual special needs []  - 0 Assessment of Community assistance (transportation, D/C planning, etc.) []  -  0 Additional assistance / Altered mentation []  - 0 Support Surface(s) Assessment (bed, cushion, seat, etc.) INTERVENTIONS - Wound Cleansing / Measurement X - Simple Wound Cleansing - one wound 1 5 []  - 0  Complex Wound Cleansing - multiple wounds X- 1 5 Wound Imaging (photographs - any number of wounds) []  - 0 Wound Tracing (instead of photographs) X- 1 5 Simple Wound Measurement - one wound []  - 0 Complex Wound Measurement - multiple wounds INTERVENTIONS - Wound Dressings X - Small Wound Dressing one or multiple wounds 1 10 []  - 0 Medium Wound Dressing one or multiple wounds []  - 0 Large Wound Dressing one or multiple wounds []  - 0 Application of Medications - topical []  - 0 Application of Medications - injection INTERVENTIONS - Miscellaneous []  - External ear exam 0 []  - 0 Specimen Collection (cultures, biopsies, blood, body fluids, etc.) []  - 0 Specimen(s) / Culture(s) sent or taken to Lab for analysis []  - 0 Patient Transfer (multiple staff / Civil Service fast streamer / Similar devices) []  - 0 Simple Staple / Suture removal (25 or less) []  - 0 Complex Staple / Suture removal (26 or more) []  - 0 Hypo / Hyperglycemic Management (close monitor of Blood Glucose) []  - 0 Ankle / Brachial Index (ABI) - do not check if billed separately X- 1 5 Vital Signs [name], [name] ([id number]) Has the patient been seen at the hospital within the last three years: Yes Total Score: 85 Level Of Care: New/Established - Level 3 Electronic Signature(s) Signed: 05/12/2018 1:37:43 PM By: Harold Barban Entered By: Harold Barban on 05/11/2018 08:32:32 [name] ([id number]) -------------------------------------------------------------------------------- Encounter Discharge Information Details Patient Name: [name] Date of Service: 05/11/2018 8:00 AM Medical Record Number: [id number] Patient Account Number: [id number] Date of Birth/Sex: [birth year] (37 y.o. M) Treating RN: [name] Primary  Care Merrissa Giacobbe: [name] Other Clinician: Referring Kaaren Nass: [name] Treating Hurshel Bouillon/Extender: Melburn Hake, [name]: 83 Encounter Discharge Information Items Discharge Condition: Stable Ambulatory Status: Ambulatory Discharge Destination: Home Transportation: Private Auto Accompanied By: self Schedule Follow-up Appointment: Yes Clinical Summary of Care: Electronic Signature(s) Signed: 05/11/2018 8:37:27 AM By: [name], [name], RN, [name], [name], [name] Entered By: [name], [name], RN, [name], [name] on 05/11/2018 08:37:27 [name] ([id number]) -------------------------------------------------------------------------------- Lower Extremity Assessment Details Patient Name: [name] Date of Service: 05/11/2018 8:00 AM Medical Record Number: [id number] Patient Account Number: [id number] Date of Birth/Sex: June 07, 1980 (37 y.o. M) Treating RN: Montey Hora Primary Care Arif Amendola: [name] Other Clinician: Referring Tecumseh Yeagley: [name] Treating Kelvis Berger/Extender: Melburn Hake, [name]: 85 Edema Assessment Assessed: [Left: No] [Right: No] [Left: Edema] [Right: :] Calf Left: Right: Point of Measurement: 30 cm From Medial Instep 48.4 cm cm Ankle Left: Right: Point of Measurement: 12 cm From Medial Instep 30.5 cm cm Vascular Assessment Pulses: Dorsalis Pedis Palpable: [Left:Yes] Posterior Tibial Extremity colors, hair growth, and conditions: Extremity Color: [Left:Hyperpigmented] Hair Growth on Extremity: [Left:Yes] Temperature of Extremity: [Left:Warm] Capillary Refill: [Left:< 3 seconds] Toe Nail Assessment Left: Right: Thick: Yes Discolored: Yes Deformed: No Improper Length and Hygiene: Yes Electronic Signature(s) Signed: 05/11/2018 4:55:08 PM By: Montey Hora Entered By: Montey Hora on 05/11/2018 08:18:11 [name] ([id number]) -------------------------------------------------------------------------------- Multi  Wound Chart Details Patient Name: [name] Date of Service: 05/11/2018 8:00 AM Medical Record Number: [id number] Patient Account Number: [id number] Date of Birth/Sex: October 27, 1980 (37 y.o. 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] [N/A:N/A] Comorbid History: [1:Sleep Apnea, Hypertension, Colitis] [N/A:N/A] Date Acquired: [1:11/18/2015] [N/A:N/A] Weeks of Treatment: [1:74] [N/A:N/A] Wound Status: [1:Open] [N/A:N/A] Measurements L x W x D [1:2.7x3.5x0.5] [N/A:N/A] (cm) Area (cm) : [1:7.422] [N/A:N/A] Volume (cm) : [1:3.711] [N/A:N/A] % Reduction in Area: [1:-51.20%] [N/A:N/A] % Reduction in Volume: [1:5.50%] [N/A:N/A] Classification: [1:Full Thickness With Exposed Support Structures] [N/A:N/A] Exudate Amount: [1:Large] [N/A:N/A] Exudate Type: [1:Serous] [N/A:N/A] Exudate Color: [1:amber] [N/A:N/A] Wound Margin: [1:Distinct, outline attached] [N/A:N/A] Granulation Amount: [1:Small (1-33%)] [N/A:N/A] Granulation Quality: [1:Red, Pink] [N/A:N/A] Necrotic Amount: [1:Large (67-100%)] [N/A:N/A] Exposed Structures: [1:Fat Layer (Subcutaneous Tissue) Exposed: Yes Muscle: Yes Fascia: No Tendon: No Joint: No Bone: No] [N/A:N/A] Epithelialization: [1:Small (1-33%)] [N/A:N/A] Periwound Skin Texture: [1:Induration: Yes Excoriation: No Callus: No Crepitus: No] [N/A:N/A] Rash: No Scarring: No Periwound Skin Moisture: Maceration: No N/A N/A Dry/Scaly: No Periwound Skin Color: Erythema: Yes N/A  N/A Atrophie Blanche: No Cyanosis: No Ecchymosis: No Hemosiderin Staining: No Mottled: No Pallor: No Rubor: No Erythema Location: Circumferential N/A N/A Temperature: No Abnormality N/A N/A Tenderness on Palpation: Yes N/A N/A Wound Preparation: Ulcer Cleansing: N/A N/A Rinsed/Irrigated with Saline Topical Anesthetic Applied: Other: lidocaine 4% Treatment Notes Electronic Signature(s) Signed: 05/12/2018 1:37:43 PM By: Harold Barban Entered By: Harold Barban on 05/11/2018 08:28:51 [name], [name] ([id number]) -------------------------------------------------------------------------------- [place] Details Patient Name: [name] Date of Service: 05/11/2018 8:00 AM Medical Record Number: [id number] Patient Account Number: [id number] Date of Birth/Sex: Aug 09, 1980 (37 y.o. M) Treating RN: Harold Barban Primary Care Nazier Neyhart: [name] Other Clinician: Referring Doniqua Saxby: [name] Treating Gloris Shiroma/Extender: Melburn Hake, [name]: 72 Active Inactive Orientation to the Wound Care Program Nursing Diagnoses: Knowledge deficit related to the wound healing center program Goals: Patient/caregiver will verbalize understanding of the [facility] Program Date Initiated: 12/11/2016 Target Resolution Date: 03/13/2017 Goal Status: Active Interventions: Provide education on orientation to the wound center Notes: Venous Leg Ulcer Nursing Diagnoses: Knowledge deficit related to disease process and management Potential for venous Insuffiency (use before diagnosis confirmed) Goals: Non-invasive venous studies are completed as ordered Date Initiated: 12/11/2016 Target Resolution Date: 03/13/2017 Goal Status: Active Patient will maintain optimal edema control Date Initiated: 12/11/2016 Target Resolution Date: 03/13/2017 Goal Status: Active Patient/caregiver will verbalize understanding of disease process and disease  management Date Initiated: 12/11/2016 Target Resolution Date: 03/13/2017 Goal Status: Active Verify adequate tissue perfusion prior to therapeutic compression application Date Initiated: 12/11/2016 Target Resolution Date: 03/13/2017 Goal Status: Active Interventions: Assess peripheral edema status every visit. Compression as ordered Provide education on venous insufficiency Treatment Activities: [name], [name] ([id number]) Therapeutic compression applied : 12/11/2016 Notes: Wound/Skin Impairment Nursing Diagnoses: Impaired tissue integrity Knowledge deficit related to ulceration/compromised skin integrity Goals: Patient/caregiver will verbalize understanding of skin care regimen Date Initiated: 12/11/2016 Target Resolution Date: 03/13/2017 Goal Status: Active Ulcer/skin breakdown will have a volume reduction of 30% by week 4 Date Initiated: 12/11/2016 Target Resolution Date: 03/13/2017 Goal Status: Active Ulcer/skin breakdown will have a volume reduction of 50% by week 8 Date Initiated: 12/11/2016 Target Resolution Date: 03/13/2017 Goal Status: Active Ulcer/skin breakdown will have a volume reduction of 80% by week 12 Date Initiated: 12/11/2016 Target Resolution Date: 03/13/2017 Goal Status: Active Ulcer/skin breakdown will heal within 14 weeks Date Initiated: 12/11/2016 Target Resolution Date: 03/13/2017 Goal Status: Active Interventions: Assess patient/caregiver ability to obtain necessary supplies Assess patient/caregiver ability to perform ulcer/skin care regimen upon admission and as needed Assess ulceration(s) every visit Provide education on ulcer and skin care Treatment Activities: Skin care regimen  initiated : 12/11/2016 Topical wound management initiated : 12/11/2016 Notes: Electronic Signature(s) Signed: 05/12/2018 1:37:43 PM By: Harold Barban Entered By: Harold Barban on 05/11/2018 08:28:32 [name]  ([id number]) -------------------------------------------------------------------------------- Pain Assessment Details Patient Name: [name] Date of Service: 05/11/2018 8:00 AM Medical Record Number: [id number] Patient Account Number: [id number] Date of Birth/Sex: [birth year] (37 y.o. M) Treating RN: Montey Hora Primary Care Gianni Fuchs: [name] Other Clinician: Referring Ziana Heyliger: [name] Treating Karrington Mccravy/Extender: Melburn Hake, [name]: 98 Active Problems Location of Pain Severity and Description of Pain Patient Has Paino No Site Locations Pain Management and Medication Current Pain Management: Goals for Pain Management hurts off and on but not currently Electronic Signature(s) Signed: 05/11/2018 4:55:08 PM By: Montey Hora Entered By: Montey Hora on 05/11/2018 08:09:39 [name] ([id number]) -------------------------------------------------------------------------------- Patient/Caregiver Education Details Patient Name: [name] Date of Service: 05/11/2018 8:00 AM Medical Record Number: [id number] Patient Account Number: [id number] Date of Birth/Gender: 1981/03/03 (37 y.o. M) Treating RN: Harold Barban Primary Care Physician: [name] Other Clinician: Referring Physician: [name] Treating Physician/Extender: Sharalyn Ink in Treatment: 34 Education Assessment Education Provided To: Patient Education Topics Provided Electronic Signature(s) Signed: 05/12/2018 1:37:43 PM By: Harold Barban Entered By: Harold Barban on 05/11/2018 08:29:14 [name] ([id number]) -------------------------------------------------------------------------------- Wound Assessment Details Patient Name: [name] Date of Service: 05/11/2018 8:00 AM Medical Record Number: [id number] Patient Account Number: [id number] Date of Birth/Sex: 1980/12/29 (37 y.o. M) Treating RN: Montey Hora Primary Care Sparkles Mcneely: [name] Other Clinician: Referring Remington Skalsky: [name] Treating Hristopher Missildine/Extender: Melburn Hake, [name]: 11 Wound Status Wound Number: 1 Primary Etiology: Pyoderma Wound Location: Left Lower Leg - Lateral Wound Status: Open Wounding Event: Gradually Appeared Comorbid History: Sleep Apnea, Hypertension, Colitis Date Acquired: 11/18/2015 Weeks Of Treatment: 74 Clustered Wound: No Photos Photo Uploaded By: Montey Hora on 05/11/2018 14:22:15 Wound Measurements Length: (cm) 2.7 Width: (cm) 3.5 Depth: (cm) 0.5 Area: (cm) 7.422 Volume: (cm) 3.711 % Reduction in Area: -51.2% % Reduction in Volume: 5.5% Epithelialization: Small (1-33%) Tunneling: No Undermining: No Wound Description Full Thickness With Exposed Support Classification: Structures Wound Margin: Distinct, outline attached Exudate Large Amount: Exudate Type: Serous Exudate Color: amber Foul Odor After Cleansing: No Slough/Fibrino Yes Wound Bed Granulation Amount: Small (1-33%) Exposed Structure Granulation Quality: Red, Pink Fascia Exposed: No Necrotic Amount: Large (67-100%) Fat Layer (Subcutaneous Tissue) Exposed: Yes Necrotic Quality: Adherent Slough Tendon Exposed: No Muscle Exposed: Yes Necrosis of Muscle: No Joint Exposed: No [name], [name] ([id number]) Bone Exposed: No Periwound Skin Texture Texture Color No Abnormalities Noted: No No Abnormalities Noted: No Callus: No Atrophie Blanche: No Crepitus: No Cyanosis: No Excoriation: No Ecchymosis: No Induration: Yes Erythema: Yes Rash: No Erythema Location: Circumferential Scarring: No Hemosiderin Staining: No Mottled: No Moisture Pallor: No No Abnormalities Noted: No Rubor: No Dry / Scaly: No Maceration: No Temperature / Pain Temperature: No Abnormality Tenderness on Palpation: Yes Wound Preparation Ulcer Cleansing: Rinsed/Irrigated with Saline Topical Anesthetic Applied: Other: lidocaine 4%, Treatment  Notes Wound #1 (Left, Lateral Lower Leg) Notes Triple antibiotic, BFD and patient's own compression Electronic Signature(s) Signed: 05/11/2018 4:55:08 PM By: Montey Hora Entered By: Montey Hora on 05/11/2018 08:16:29 [name] ([id number]) -------------------------------------------------------------------------------- [place] Details Patient Name: [name] Date of Service: 05/11/2018 8:00 AM Medical Record Number: [id number] Patient Account Number: [id number] Date of Birth/Sex: August 06, 1980 (37 y.o. M) Treating RN: Montey Hora Primary Care Raevin Wierenga: [name] Other Clinician: Referring Allyce Bochicchio: [name] Treating Kaitlyne Friedhoff/Extender: Melburn Hake, [name]: 33 Vital Signs  Time Taken: 08:09 Temperature (F): 98.2 Height (in): 71 Pulse (bpm): 81 Weight (lbs): 338 Respiratory Rate (breaths/min): 18 Body Mass Index (BMI): 47.1 Blood Pressure (mmHg): 137/70 Reference Range: 80 - 120 mg / dl Electronic Signature(s) Signed: 05/11/2018 4:55:08 PM By: Montey Hora Entered By: Montey Hora on 05/11/2018 08:10:47

## 2018-05-16 LAB — CBC W/ DIFFERENTIAL
BANDED NEUTROPHILS ABSOLUTE COUNT: 0.1 10*3/uL (ref 0.0–0.1)
BASOPHILS ABSOLUTE COUNT: 0 10*3/uL (ref 0.0–0.2)
BASOPHILS RELATIVE PERCENT: 0 %
EOSINOPHILS ABSOLUTE COUNT: 0.3 10*3/uL (ref 0.0–0.4)
EOSINOPHILS RELATIVE PERCENT: 4 %
HEMATOCRIT: 40.1 % (ref 37.5–51.0)
HEMOGLOBIN: 13.2 g/dL (ref 13.0–17.7)
IMMATURE GRANULOCYTES: 1 %
LYMPHOCYTES ABSOLUTE COUNT: 1.8 10*3/uL (ref 0.7–3.1)
MEAN CORPUSCULAR HEMOGLOBIN CONC: 32.9 g/dL (ref 31.5–35.7)
MEAN CORPUSCULAR HEMOGLOBIN: 30.1 pg (ref 26.6–33.0)
MEAN CORPUSCULAR VOLUME: 92 fL (ref 79–97)
MONOCYTES ABSOLUTE COUNT: 0.6 10*3/uL (ref 0.1–0.9)
MONOCYTES RELATIVE PERCENT: 6 %
NEUTROPHILS ABSOLUTE COUNT: 6.7 10*3/uL (ref 1.4–7.0)
NEUTROPHILS RELATIVE PERCENT: 70 %
PLATELET COUNT: 261 10*3/uL (ref 150–450)
RED BLOOD CELL COUNT: 4.38 x10E6/uL (ref 4.14–5.80)
RED CELL DISTRIBUTION WIDTH: 12.5 % (ref 12.3–15.4)
WHITE BLOOD CELL COUNT: 9.5 10*3/uL (ref 3.4–10.8)

## 2018-05-16 LAB — GFR MDRD AF AMER: Lab: 109

## 2018-05-16 LAB — CREATININE: GFR MDRD NON AF AMER: 94 mL/min/{1.73_m2}

## 2018-05-16 LAB — AST (SGOT): Lab: 16

## 2018-05-16 LAB — BLOOD UREA NITROGEN: Lab: 11

## 2018-05-16 LAB — ALT (SGPT): Lab: 28

## 2018-05-16 LAB — HEMOGLOBIN: Lab: 13.2

## 2018-05-19 MED ORDER — DAPSONE 100 MG TABLET
ORAL_TABLET | Freq: Every day | ORAL | 1 refills | 0.00000 days | Status: CP
Start: 2018-05-19 — End: 2018-07-08

## 2018-05-19 NOTE — Unmapped (Signed)
Labs stable - increase to 150 mg daily of dapsone.

## 2018-06-01 ENCOUNTER — Encounter: Payer: 59 | Attending: Physician Assistant | Admitting: Physician Assistant

## 2018-06-01 ENCOUNTER — Other Ambulatory Visit: Payer: Self-pay

## 2018-06-01 DIAGNOSIS — Z8249 Family history of ischemic heart disease and other diseases of the circulatory system: Secondary | ICD-10-CM | POA: Insufficient documentation

## 2018-06-01 DIAGNOSIS — E11622 Type 2 diabetes mellitus with other skin ulcer: Secondary | ICD-10-CM | POA: Insufficient documentation

## 2018-06-01 DIAGNOSIS — I10 Essential (primary) hypertension: Secondary | ICD-10-CM | POA: Insufficient documentation

## 2018-06-01 DIAGNOSIS — I872 Venous insufficiency (chronic) (peripheral): Secondary | ICD-10-CM | POA: Insufficient documentation

## 2018-06-01 DIAGNOSIS — F3342 Major depressive disorder, recurrent, in full remission: Secondary | ICD-10-CM

## 2018-06-01 DIAGNOSIS — F17200 Nicotine dependence, unspecified, uncomplicated: Secondary | ICD-10-CM | POA: Insufficient documentation

## 2018-06-01 DIAGNOSIS — L88 Pyoderma gangrenosum: Secondary | ICD-10-CM | POA: Diagnosis not present

## 2018-06-01 DIAGNOSIS — Z823 Family history of stroke: Secondary | ICD-10-CM | POA: Diagnosis not present

## 2018-06-01 DIAGNOSIS — L97222 Non-pressure chronic ulcer of left calf with fat layer exposed: Secondary | ICD-10-CM | POA: Insufficient documentation

## 2018-06-01 DIAGNOSIS — L97225 Non-pressure chronic ulcer of left calf with muscle involvement without evidence of necrosis: Secondary | ICD-10-CM | POA: Diagnosis not present

## 2018-06-01 DIAGNOSIS — E669 Obesity, unspecified: Secondary | ICD-10-CM | POA: Diagnosis not present

## 2018-06-01 DIAGNOSIS — Z91011 Allergy to milk products: Secondary | ICD-10-CM | POA: Diagnosis not present

## 2018-06-01 DIAGNOSIS — Z6841 Body Mass Index (BMI) 40.0 and over, adult: Secondary | ICD-10-CM | POA: Insufficient documentation

## 2018-06-01 NOTE — Progress Notes (Signed)
DONEVIN, SAINSBURY ([id number]) Visit Report for 06/01/2018 Arrival Information Details Patient Name: [name], [name] Date of Service: 06/01/2018 8:00 AM Medical Record Number: [id number] Patient Account Number: [id number] Date of Birth/Sex: [birth year] (37 y.o. M) Treating RN: [name] Primary Care Estefani Bateson: [name] Other Clinician: Referring Emeril Stille: [name] Treating Shahidah Nesbitt/Extender: Melburn Hake, [name]: 16 Visit Information History Since Last Visit Added or deleted any medications: Yes Patient Arrived: Ambulatory Any new allergies or adverse reactions: No Arrival Time: 08:07 Had a fall or experienced change in No Accompanied By: self activities of daily living that may affect Transfer Assistance: None risk of falls: Patient Identification Verified: Yes Signs or symptoms of abuse/neglect since last visito No Secondary Verification Process Completed: Yes Hospitalized since last visit: No Patient Requires Transmission-Based No Implantable device outside of the clinic excluding No Precautions: cellular tissue based products placed in the center Patient Has Alerts: No since last visit: Has Dressing in Place as Prescribed: Yes Pain Present Now: No Electronic Signature(s) Signed: 06/01/2018 3:49:31 PM By: [name], [name], RN, [name], [name], [name] Entered By: [name], [name], RN, [name], [name] on 06/01/2018 08:07:50 [name] ([id number]) -------------------------------------------------------------------------------- Clinic Level of Care Assessment Details Patient Name: [name] Date of Service: 06/01/2018 8:00 AM Medical Record Number: [id number] Patient Account Number: [id number] Date of Birth/Sex: 1980/09/13 (37 y.o. M) Treating RN: Harold Barban Primary Care Anselma Herbel: [name] Other Clinician: Referring Sena Hoopingarner: [name] Treating Ellean Firman/Extender: Melburn Hake, [name]: 73 Clinic Level of Care Assessment Items TOOL 4 Quantity  Score []  - Use when only an EandM is performed on FOLLOW-UP visit 0 ASSESSMENTS - Nursing Assessment / Reassessment X - Reassessment of Co-morbidities (includes updates in patient status) 1 10 X- 1 5 Reassessment of Adherence to Treatment Plan ASSESSMENTS - Wound and Skin Assessment / Reassessment X - Simple Wound Assessment / Reassessment - one wound 1 5 []  - 0 Complex Wound Assessment / Reassessment - multiple wounds []  - 0 Dermatologic / Skin Assessment (not related to wound area) ASSESSMENTS - Focused Assessment []  - Circumferential Edema Measurements - multi extremities 0 []  - 0 Nutritional Assessment / Counseling / Intervention []  - 0 Lower Extremity Assessment (monofilament, tuning fork, pulses) []  - 0 Peripheral Arterial Disease Assessment (using hand held doppler) ASSESSMENTS - Ostomy and/or Continence Assessment and Care []  - Incontinence Assessment and Management 0 []  - 0 Ostomy Care Assessment and Management (repouching, etc.) PROCESS - Coordination of Care X - Simple Patient / Family Education for ongoing care 1 15 []  - 0 Complex (extensive) Patient / Family Education for ongoing care X- 1 10 Staff obtains Programmer, systems, Records, Test Results / Process Orders []  - 0 Staff telephones HHA, Nursing Homes / Clarify orders / etc []  - 0 Routine Transfer to another Facility (non-emergent condition) []  - 0 Routine Hospital Admission (non-emergent condition) []  - 0 New Admissions / Biomedical engineer / Ordering NPWT, Apligraf, etc. []  - 0 Emergency Hospital Admission (emergent condition) X- 1 10 Simple Discharge Coordination [name], [name] ([id number]) []  - 0 Complex (extensive) Discharge Coordination PROCESS - Special Needs []  - Pediatric / Minor Patient Management 0 []  - 0 Isolation Patient Management []  - 0 Hearing / Language / Visual special needs []  - 0 Assessment of Community assistance (transportation, D/C planning, etc.) []  - 0 Additional assistance /  Altered mentation []  - 0 Support Surface(s) Assessment (bed, cushion, seat, etc.) INTERVENTIONS - Wound Cleansing / Measurement X - Simple Wound Cleansing - one wound 1 5 []  -  0 Complex Wound Cleansing - multiple wounds X- 1 5 Wound Imaging (photographs - any number of wounds) []  - 0 Wound Tracing (instead of photographs) X- 1 5 Simple Wound Measurement - one wound []  - 0 Complex Wound Measurement - multiple wounds INTERVENTIONS - Wound Dressings X - Small Wound Dressing one or multiple wounds 1 10 []  - 0 Medium Wound Dressing one or multiple wounds []  - 0 Large Wound Dressing one or multiple wounds []  - 0 Application of Medications - topical []  - 0 Application of Medications - injection INTERVENTIONS - Miscellaneous []  - External ear exam 0 []  - 0 Specimen Collection (cultures, biopsies, blood, body fluids, etc.) []  - 0 Specimen(s) / Culture(s) sent or taken to Lab for analysis []  - 0 Patient Transfer (multiple staff / Civil Service fast streamer / Similar devices) []  - 0 Simple Staple / Suture removal (25 or less) []  - 0 Complex Staple / Suture removal (26 or more) []  - 0 Hypo / Hyperglycemic Management (close monitor of Blood Glucose) []  - 0 Ankle / Brachial Index (ABI) - do not check if billed separately X- 1 5 Vital Signs [name], [name] ([id number]) Has the patient been seen at the hospital within the last three years: Yes Total Score: 85 Level Of Care: New/Established - Level 3 Electronic Signature(s) Signed: 06/01/2018 3:50:02 PM By: Harold Barban Entered By: Harold Barban on 06/01/2018 08:49:26 [name] ([id number]) -------------------------------------------------------------------------------- Lower Extremity Assessment Details Patient Name: [name] Date of Service: 06/01/2018 8:00 AM Medical Record Number: [id number] Patient Account Number: [id number] Date of Birth/Sex: Nov 18, 1980 (37 y.o. M) Treating RN: [name] Primary Care Moriah Shawley: [name]  Other Clinician: Referring Cj Beecher: [name] Treating Jaelen Gellerman/Extender: Melburn Hake, [name]: 71 Edema Assessment Assessed: [Left: No] [Right: No] [Left: Edema] [Right: :] Calf Left: Right: Point of Measurement: 30 cm From Medial Instep 51.5 cm cm Ankle Left: Right: Point of Measurement: 12 cm From Medial Instep 31 cm cm Vascular Assessment Pulses: Dorsalis Pedis Palpable: [Left:Yes] Posterior Tibial Extremity colors, hair growth, and conditions: Extremity Color: [Left:Hyperpigmented] Hair Growth on Extremity: [Left:Yes] Temperature of Extremity: [Left:Warm] Capillary Refill: [Left:< 3 seconds] Toe Nail Assessment Left: Right: Thick: No Discolored: No Deformed: No Improper Length and Hygiene: No Electronic Signature(s) Signed: 06/01/2018 3:49:31 PM By: [name], [name], RN, [name], [name], [name] Entered By: [name], [name], RN, [name], [name] on 06/01/2018 08:17:16 [name] ([id number]) -------------------------------------------------------------------------------- Multi Wound Chart Details Patient Name: [name] Date of Service: 06/01/2018 8:00 AM Medical Record Number: [id number] Patient Account Number: [id number] Date of Birth/Sex: [birth year] (37 y.o. 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] [N/A:N/A] Comorbid History: [1:Sleep Apnea, Hypertension, Colitis] [N/A:N/A] Date Acquired: [1:11/18/2015] [N/A:N/A] Weeks of Treatment: [1:77] [N/A:N/A] Wound Status: [1:Open]  [N/A:N/A] Measurements L x W x D [1:2.4x3.5x0.4] [N/A:N/A] (cm) Area (cm) : [1:6.597] [N/A:N/A] Volume (cm) : [1:2.639] [N/A:N/A] % Reduction in Area: [1:-34.40%] [N/A:N/A] % Reduction in Volume: [1:32.80%] [N/A:N/A] Classification: [1:Full Thickness With Exposed Support Structures] [N/A:N/A] Exudate Amount: [1:Large] [N/A:N/A] Exudate Type: [1:Serous] [N/A:N/A] Exudate Color: [1:amber] [N/A:N/A] Wound Margin: [1:Distinct, outline attached] [N/A:N/A] Granulation Amount: [1:None Present (0%)] [N/A:N/A] Necrotic Amount: [1:Small (1-33%)] [N/A:N/A] Exposed Structures: [1:Fat Layer (Subcutaneous  Tissue) Exposed: Yes Muscle: Yes Fascia: No Tendon: No Joint: No Bone: No] [N/A:N/A] Epithelialization: [1:Small (1-33%)] [N/A:N/A] Periwound Skin Texture: [1:Induration: Yes Scarring: Yes Excoriation: No Callus: No Crepitus: No Rash: No] [N/A:N/A] Periwound Skin Moisture: Maceration: No N/A N/A Dry/Scaly: No Periwound Skin Color: Hemosiderin Staining: Yes N/A N/A Atrophie Blanche: No Cyanosis: No Ecchymosis: No Erythema: No Mottled: No Pallor: No Rubor: No Temperature: No Abnormality N/A N/A Tenderness on Palpation: Yes N/A N/A Wound Preparation: Ulcer Cleansing: N/A N/A Rinsed/Irrigated with Saline Topical Anesthetic Applied: Other: lidocaine 4% Treatment Notes Electronic Signature(s) Signed: 06/01/2018 3:50:02 PM By: Harold Barban Entered By: Harold Barban on 06/01/2018 08:47:19 [name], [name] ([id number]) -------------------------------------------------------------------------------- Multi-Disciplinary Care Plan Details Patient Name: [name] Date of Service: 06/01/2018 8:00 AM Medical Record Number: [id number] Patient Account Number: [id number] Date of Birth/Sex: 10/15/1980 (37 y.o. M) Treating RN: Harold Barban Primary Care Suhaib Guzzo: [name] Other Clinician: Referring Candelaria Pies: [name] Treating Emitt Maglione/Extender: Melburn Hake, HOYT Weeks in  Treatment: 72 Active Inactive Orientation to the Wound Care Program Nursing Diagnoses: Knowledge deficit related to the wound healing center program Goals: Patient/caregiver will verbalize understanding of the [facility] Date Initiated: 12/11/2016 Target Resolution Date: 03/13/2017 Goal Status: Active Interventions: Provide education on orientation to the wound center Notes: Venous Leg Ulcer Nursing Diagnoses: Knowledge deficit related to disease process and management Potential for venous Insuffiency (use before diagnosis confirmed) Goals: Non-invasive venous studies are completed as ordered Date Initiated: 12/11/2016 Target Resolution Date: 03/13/2017 Goal Status: Active Patient will maintain optimal edema control Date Initiated: 12/11/2016 Target Resolution Date: 03/13/2017 Goal Status: Active Patient/caregiver will verbalize understanding of disease process and disease management Date Initiated: 12/11/2016 Target Resolution Date: 03/13/2017 Goal Status: Active Verify adequate tissue perfusion prior to therapeutic compression application Date Initiated: 12/11/2016 Target Resolution Date: 03/13/2017 Goal Status: Active Interventions: Assess peripheral edema status every visit. Compression as ordered Provide education on venous insufficiency Treatment Activities: [name], [name] ([id number]) Therapeutic compression applied : 12/11/2016 Notes: Wound/Skin Impairment Nursing Diagnoses: Impaired tissue integrity Knowledge deficit related to ulceration/compromised skin integrity Goals: Patient/caregiver will verbalize understanding of skin care regimen Date Initiated: 12/11/2016 Target Resolution Date: 03/13/2017 Goal Status: Active Ulcer/skin breakdown will have a volume reduction of 30% by week 4 Date Initiated: 12/11/2016 Target Resolution Date: 03/13/2017 Goal Status: Active Ulcer/skin breakdown will have a volume reduction of 50% by week 8 Date  Initiated: 12/11/2016 Target Resolution Date: 03/13/2017 Goal Status: Active Ulcer/skin breakdown will have a volume reduction of 80% by week 12 Date Initiated: 12/11/2016 Target Resolution Date: 03/13/2017 Goal Status: Active Ulcer/skin breakdown will heal within 14 weeks Date Initiated: 12/11/2016 Target Resolution Date: 03/13/2017 Goal Status: Active Interventions: Assess patient/caregiver ability to obtain necessary supplies Assess patient/caregiver ability to perform ulcer/skin care regimen upon admission and as needed Assess ulceration(s) every visit Provide education on ulcer and skin care Treatment Activities: Skin care regimen initiated : 12/11/2016 Topical wound management initiated : 12/11/2016 Notes: Electronic Signature(s) Signed: 06/01/2018 3:50:02 PM By: Harold Barban Entered By: Harold Barban on 06/01/2018 08:46:51 [name] ([id number]) -------------------------------------------------------------------------------- Pain Assessment Details Patient Name: [name] Date of Service: 06/01/2018 8:00 AM Medical Record Number: [id number] Patient Account Number: [id number] Date of Birth/Sex: 1980-09-25 (37 y.o. M) Treating RN: [name] Primary Care Jaylinn Hellenbrand: [name] Other Clinician: Referring Kaylem Gidney: [name] Treating Rennie Rouch/Extender: Melburn Hake, [name]: 41 Active Problems Location of Pain Severity and Description of Pain Patient Has Paino No Site Locations Pain Management and Medication Current Pain Management: Electronic Signature(s) Signed:  06/01/2018 3:49:31 PM By: [name], [name], RN, [name], [name], [name] Entered By: [name], [name], RN, [name], [name] on 06/01/2018 08:08:57 [name] ([id number]) -------------------------------------------------------------------------------- Patient/Caregiver Education Details Patient Name: [name] Date of Service: 06/01/2018 8:00 AM Medical Record Number: [id number] Patient Account Number:  [id number] Date of Birth/Gender: Jul 27, 1980 (37 y.o. M) Treating RN: Harold Barban Primary Care Physician: [name] Other Clinician: Referring Physician: [name] Treating Physician/Extender: Sharalyn Ink in Treatment: 55 Education Assessment Education Provided To: Patient Education Topics Provided Venous: Handouts: Controlling Swelling with Compression Stockings Methods: Demonstration, Explain/Verbal Responses: State content correctly Wound/Skin Impairment: Handouts: Caring for Your Ulcer Methods: Demonstration, Explain/Verbal Responses: State content correctly Electronic Signature(s) Signed: 06/01/2018 3:50:02 PM By: Harold Barban Entered By: Harold Barban on 06/01/2018 08:48:00 [name] ([id number]) -------------------------------------------------------------------------------- Wound Assessment Details Patient Name: [name] Date of Service: 06/01/2018 8:00 AM Medical Record Number: [id number] Patient Account Number: [id number] Date of Birth/Sex: October 27, 1980 (37 y.o. M) Treating RN: [name] Primary Care Latori Beggs: [name] Other Clinician: Referring Dmitriy Gair: [name] Treating Carnesha Maravilla/Extender: Melburn Hake, [name]: 79 Wound Status Wound Number: 1 Primary Etiology: Pyoderma Wound Location: Left Lower Leg - Lateral Wound Status: Open Wounding Event: Gradually Appeared Comorbid History: Sleep Apnea, Hypertension, Colitis Date Acquired: 11/18/2015 Weeks Of Treatment: 77 Clustered Wound: No Photos Photo Uploaded By: [name], [name], RN, [name], [name] on 06/01/2018 15:58:19 Wound Measurements Length: (cm) 2.4 Width: (cm) 3.5 Depth: (cm) 0.4 Area: (cm) 6.597 Volume: (cm) 2.639 % Reduction in Area: -34.4% % Reduction in Volume: 32.8% Epithelialization: Small (1-33%) Tunneling: No Undermining: No Wound Description Full Thickness With Exposed Support Foul Odor Classification: Structures Slough/Fi Wound  Margin: Distinct, outline attached Exudate Large Amount: Exudate Type: Serous Exudate Color: amber After Cleansing: No brino Yes Wound Bed Granulation Amount: None Present (0%) Exposed Structure Necrotic Amount: Small (1-33%) Fascia Exposed: No Necrotic Quality: Adherent Slough Fat Layer (Subcutaneous Tissue) Exposed: Yes Tendon Exposed: No Muscle Exposed: Yes Necrosis of Muscle: No Joint Exposed: No [name], [name] ([id number]) Bone Exposed: No Periwound Skin Texture Texture Color No Abnormalities Noted: No No Abnormalities Noted: No Callus: No Atrophie Blanche: No Crepitus: No Cyanosis: No Excoriation: No Ecchymosis: No Induration: Yes Erythema: No Rash: No Hemosiderin Staining: Yes Scarring: Yes Mottled: No Pallor: No Moisture Rubor: No No Abnormalities Noted: No Dry / Scaly: No Temperature / Pain Maceration: No Temperature: No Abnormality Tenderness on Palpation: Yes Wound Preparation Ulcer Cleansing: Rinsed/Irrigated with Saline Topical Anesthetic Applied: Other: lidocaine 4%, Electronic Signature(s) Signed: 06/01/2018 3:49:31 PM By: [name], [name], RN, [name], [name], [name] Entered By: [name], [name], RN, [name], [name] on 06/01/2018 08:16:00 [name], [name] ([id number]) -------------------------------------------------------------------------------- Vitals Details Patient Name: [name] Date of Service: 06/01/2018 8:00 AM Medical Record Number: [id number] Patient Account Number: [id number] Date of Birth/Sex: [birth year] (37 y.o. M) Treating RN: [name] Primary Care Kenzy Campoverde: [name] Other Clinician: Referring Elizeo Rodriques: [name] Treating Stephaniemarie Stoffel/Extender: Melburn Hake, [name]: 24 Vital Signs Time Taken: 08:10 Temperature (F): 98.5 Height (in): 71 Pulse (bpm): 79 Weight (lbs): 338 Respiratory Rate (breaths/min): 18 Body Mass Index (BMI): 47.1 Blood Pressure (mmHg): 128/71 Reference Range: 80 - 120 mg / dl Electronic  Signature(s) Signed: 06/01/2018 3:49:31 PM By: [name], [name], RN, [name], [name], [name] Entered By: [name], [name], RN, [name], [name] on 06/01/2018 08:10:44

## 2018-06-01 NOTE — Progress Notes (Signed)
[name], [name] ([id number]) Visit Report for 06/01/2018 Chief Complaint Document Details Patient Name: [name], [name] Date of Service: 06/01/2018 8:00 AM Medical Record Number: [id number] Patient Account Number: [id number] Date of Birth/Sex: 1980/09/28 (37 y.o. M) Treating RN: [name] Primary Care Provider: Alma [name] Other Clinician: Referring Provider: Alma [name] Treating Provider/Extender: [name], [name] in Treatment: 64 Information Obtained from: Patient Chief Complaint He is here in follow up evaluation for LLE pyoderma ulcer Electronic Signature(s) Signed: 06/01/2018 3:59:30 PM By: Worthy Keeler PA-C Entered By: Worthy [name] on 06/01/2018 08:16:03 [name], [name] ([id number]) -------------------------------------------------------------------------------- HPI Details Patient Name: [name] Date of Service: 06/01/2018 8:00 AM Medical Record Number: [id number] Patient Account Number: [id number] Date of Birth/Sex: 08-28-1980 (37 y.o. M) Treating RN: [name] Primary Care Provider: Alma [name] Other Clinician: Referring Provider: Alma [name] Treating Provider/Extender: [name], [name] [name] in Treatment: 34 History of Present Illness HPI Description: 12/04/16; 38 year old man who comes into the clinic today for review of a wound on the posterior left calf. He tells me that is been there for about a year. He is not a diabetic he does smoke half a pack per day. He was seen in the ER on 11/20/16 felt to have cellulitis around the wound and was given clindamycin. An x-ray did not show osteomyelitis. The patient initially tells me that he has a milk allergy that sets off a pruritic itching rash on his lower legs which she scratches incessantly and he thinks that's what may have set up the wound. He has been using various topical antibiotics and ointments without any effect. He works in a [employer] and is on his feet all day. He does not have a  prior history of wounds however he does have the rash on both lower legs the right arm and the ventral aspect of his left arm. These are excoriations and clearly have had scratching however there are of macular looking areas on both legs including a substantial larger area on the right leg. This does not have an underlying open area. There is no blistering. The patient tells me that 2 years ago in [place] in response to the rash on his legs he saw a dermatologist who told him he had a condition which may be pyoderma gangrenosum although I may be putting words into his mouth. He seemed to recognize this. 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 He is not in any treatment for this. He's had no recent travel 12/11/16; the patient arrives today with his wound and roughly the same condition we've been using silver alginate this is a deep punched out wound with some surrounding erythema but no tenderness. Biopsy I did did not show confirmed pyoderma gangrenosum suggested nonspecific inflammation and vasculitis but does not provide an actual description of what was seen by the pathologist. I'm really not able to understand this We have also received information from the patient's dermatologist in [place] notes from April 2016. This was a doctor Agarwal- antal. 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 He was prescribed topical steroid high potency under occlusion which helped but at this point the patient did not have a deep punched out wound. 12/18/16; the patient's wound is larger in terms of surface area however this surface looks better and there is less depth. The surrounding erythema also is better. The patient states that the wrap we put on came off 2 days ago when he has been using  his compression stockings. He we are in the process of getting a dermatology consult. 12/26/16 on evaluation today patient's left lower extremity wound shows evidence  of infection with surrounding erythema noted. He has been tolerating the dressing changes but states that he has noted more discomfort. There is a larger area of erythema surrounding the wound. No fevers, chills, nausea, or vomiting noted at this time. With that being said the wound still does have slough covering the surface. He is not allergic to any medication that he is aware of at this point. In regard to his right lower extremity he had several regions that are erythematous and pruritic he wonders if there's anything we can do to help that. 01/02/17 I reviewed patient's wound culture which was obtained his visit last week. He was placed on doxycycline at that point. Unfortunately that does not appear to be an antibiotic that would likely help with the situation however the pseudomonas noted on culture is sensitive to Cipro. Also unfortunately patient's wound seems to have a large compared to last week's evaluation. Not severely so but there are definitely increased measurements in general. He is continuing to have discomfort as well he writes this to be a seven out of 10. In fact he would prefer me not to perform any debridement today due to the fact that he is having discomfort and considering he has an active infection on the little reluctant to do so anyway. No fevers, chills, nausea, or vomiting noted at this time. 01/08/17; patient seems dermatology on September 5. I suspect dermatology will want the slides from the biopsy I did sent to their pathologist. I'm not sure if there is a way we can expedite that. In any case the culture I did before I left on vacation 3 [name] ago showed Pseudomonas he was given 10 days of Cipro and per her description of her intake nurses is actually somewhat better this week although the wound is quite a bit bigger than I remember the last time I saw this. He still has 3 more days of Cipro [name], [name] ([id number]) 01/21/17; dermatology appointment tomorrow. He  has completed the ciprofloxacin for Pseudomonas. Surface of the wound looks better however he is had some deterioration in the lesions on his right leg. Meantime the left lateral leg wound we will continue with sample 01/29/17; patient had his dermatology appointment but I can't yet see that note. He is completed his antibiotics. The wound is more superficial but considerably larger in circumferential area than when he came in. This is in his left lateral calf. He also has swollen erythematous areas with superficial wounds on the right leg and small papular areas on both arms. There apparently areas in her his upper thighs and buttocks I did not look at those. Dermatology biopsied the right leg. Hopefully will have their input next week. 02/05/17; patient went back to see his dermatologist who told him that he had a "scratching problem" as well as staph. He is now on a 30 day course of doxycycline and I believe she gave him triamcinolone cream to the right leg areas to help with the itching [not exactly sure but probably triamcinolone]. She apparently looked at the left lateral leg wound although this was not rebiopsied and I think felt to be ultimately part of the same pathogenesis. He is using sample border foam and changing nevus himself. He now has a new open area on the right posterior leg which was his biopsy site I don't  have any of the dermatology notes 02/12/17; we put the patient in compression last week with SANTYL to the wound on the left leg and the biopsy. Edema is much better and the depth of the wound is now at level of skin. Area is still the same oBiopsy site on the right lateral leg we've also been using santyl with a border foam dressing and he is changing this himself. 02/19/17; Using silver alginate started last week to both the substantial left leg wound and the biopsy site on the right wound. He is tolerating compression well. Has a an appointment with his primary M.D. tomorrow  wondering about diuretics although I'm wondering if the edema problem is actually lymphedema 02/26/17; the patient has been to see his primary doctor Dr. Jerrel Ivory at [place] our primary care. She started him on Lasix 20 mg and this seems to have helped with the edema. However we are not making substantial change with the left lateral calf wound and inflammation. The biopsy site on the right leg also looks stable but not really all that different. 03/12/17; the patient has been to see vein and vascular Dr. Lucky Cowboy. He has had venous reflux studies I have not reviewed these. I did get a call from his dermatology office. 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 They therefore recommended clobetasol and tetracycline i.e. 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 03/26/17; apparently the patient just had reflux studies not an appointment with Dr. dew. She arrives in clinic today having applied clobetasol for 2-3 [name]. He notes over the last 2-3 days excessive drainage having to change the dressing 3-4 times a day and also expanding erythema. 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 Lillia Carmel at [facility] dermatology locally who kindly saw him at my request. 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 My concern was the degree of erythema medially extending from his left leg wound which was either inflammation from  pyoderma or cellulitis. I put him on Augmentin however culture of the wound showed Pseudomonas which is quinolone sensitive. I really don't believe he has cellulitis however in view of everything I will continue and give him a course of Cipro. He is also on doxycycline as an immune modulator for the pyoderma. In addition to his original wound on the left lateral leg with surrounding erythema he has a wound on the right posterior calf which was an original biopsy site done by dermatology. 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 Saw Dr. Lillia Carmel yesterday. He has been using topical antibiotics to both wound areas his original wound on the left and the biopsies/pathergy area on the right. There is definitely some improvement in the inflammation around the wound on the right although the patient states he has increasing sensitivity of the wounds. He is on prednisone 60 and doxycycline 1 as prescribed by Dr. Lillia Carmel. He is covering the topical  antibiotic with gauze and putting this in his own compression stocks and changing this daily. He states that Dr. Lottie Rater did a culture of the left leg wound yesterday 05/07/17; pyoderma gangrenosum. The patient saw Dr. Lillia Carmel yesterday and has a follow-up with her in one month. He is still using topical antibiotics to both wounds although he can't recall exactly what type. He is still on prednisone 60 mg. Dr. Lillia Carmel stated that the doxycycline could stop if we were in agreement. 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 The right medial leg was induced by biopsy/pathergy. The area on the right is essentially healed. 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 The right medial leg has closed and  remains closed. He is still on prednisone 60. oHe tells me he missed his last dermatology appointment with Dr. Lillia Carmel but will make another appointment. He reports that her blood sugar at a recent screen in [place] was high 200's. He was 180 today. He is more cushingoid blood pressure is Pflum, Jameer ([id number]) up a bit. I think he is going to require still much longer prednisone perhaps another 3 months before attempting to taper. In the meantime his wound is a lot better. Smaller. He is cleaning this off daily and applying topical antibiotics. When he was last in the clinic I thought about changing to [facility] and actually put in a couple of calls to dermatology although probably not during their business hours. 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 He continues on oral prednisone. He has been using triple antibiotic ointment. There is surface debris and we will transition to [facility] and have him return in 2 [name]. He has lost 30 pounds since his last appointment with lifestyle modification. He may benefit from topical steroid cream for treatment this can be considered at a later date. 08/22/17 on evaluation today patient appears to actually be doing rather well in regard to his left lateral lower extremity ulcer. He has actually been managed by Dr. Dellia Nims most recently. Patient is currently on oral steroids at this time. This seems to have been of benefit for him. Nonetheless his last visit was actually with Leah on 08/06/17. Currently he is not utilizing any topical steroid creams although this could be of benefit as well. No fevers, chills, nausea, or vomiting noted at this time. 09/05/17 on evaluation today patient appears to be doing better in regard to his left lateral lower extremity ulcer. He has been tolerating the dressing changes without complication.  He is using Santyl with good effect. Overall I'm very pleased with how things are standing at this point. Patient likewise is happy that this is doing better. 09/19/17 on evaluation today patient actually appears to be doing rather well in regard to his left lateral lower extremity ulcer. 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 He's not having any significant pain. 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 He has been tolerating the Santyl without complication and in general I feel like he's making good  progress. 10/17/17 on evaluation today patient appears to be doing very well in regard to his left lateral lower surety ulcer. He has been tolerating the dressing changes without complication. There does not appear to be any evidence of infection he's alternating the Santyl and the triple antibiotic ointment every other day this seems to be doing well for him. 11/03/17 on evaluation today patient appears to be doing very well in regard to his left lateral lower extremity ulcer. He is been tolerating the dressing changes without complication which is good news. Fortunately there does not appear to be any evidence of infection which is also great news. Overall is doing excellent they are starting to taper down on the prednisone is down to 40 mg at this point it also started topical clobetasol for him. 11/17/17 on evaluation today patient appears to be doing well in regard to his left lateral lower surety ulcer. He's been tolerating the dressing changes without complication. He does note that he is having no pain, no excessive drainage or discharge, and overall he feels like things are going about how he would expect and hope they would. Overall he seems to have no evidence of infection at this time in my opinion which is  good news. 12/04/17-He is seen in follow-up evaluation for right lateral lower extremity ulcer. He has been applying topical steroid cream. Today's measurement show slight increase in size. Over the next 2 [name] we will transition to every other day Santyl and steroid cream. He has been encouraged to monitor for changes and notify clinic with any concerns 12/15/17 on evaluation today patient's left lateral motion the ulcer and fortunately is doing worse again at this point. This just since last week to this week has close to doubled in size according to the patient. I did not seeing last week's I do not have a visual to compare this to in our system was also down so we do not have all the charts and at this point. Nonetheless it does have me somewhat concerned in regard to the fact that again he was worried enough about it he has contact the dermatology that placed them back on the full strength, 50 mg a day of the prednisone that he was taken previous. He continues to alternate using clobetasol along with Santyl at this point. He is obviously somewhat frustrated. 12/22/17 on evaluation today patient appears to be doing a little worse compared to last evaluation. Unfortunately the wound is a little deeper and slightly larger than the last week's evaluation. With that being said he has made some progress in regard to the irritation surrounding at this time unfortunately despite that progress that's been made he still has a significant issue going on here. 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 For that reason I discussed with him today the possibility of perform a wound culture to ensure there's nothing overtly infected. 01/06/18 on evaluation today patient's wound is larger and deeper than previously evaluated. With that being said it did appear that  his wound was infected after my last evaluation with him. Subsequently I did end up prescribing a prescription for Bactrim DS which she has been taking and having no complication with. Fortunately there does not appear to be any evidence of Stahlecker, Uziah ([id number]) infection at this point in time as far as anything spreading, no want to  touch, and overall I feel like things are showing signs of improvement. 01/13/18 on evaluation today patient appears to be even a little larger and deeper than last time. There still muscle exposed in the base of the wound. Nonetheless he does appear to be less erythematous I do believe inflammation is calming down also believe the infection looks like it's probably resolved at this time based on what I'm seeing. No fevers, chills, nausea, or vomiting noted at this time. 01/30/18 on evaluation today patient actually appears to visually look better for the most part. Unfortunately those visually this looks better he does seem to potentially have what may be an abscess in the muscle that has been noted in the central portion of the wound. This is the first time that I have noted what appears to be fluctuance in the central portion of the muscle. With that being said I'm somewhat more concerned about the fact that this might indicate an abscess formation at this location. I do believe that an ultrasound would be appropriate. This is likely something we need to try to do as soon as possible. He has been switch to mupirocin ointment and he is no longer using the steroid ointment as prescribed by dermatology he sees them again next week he's been decreased from 60 to 40 mg of prednisone. 03/09/18 on evaluation today patient actually appears to be doing a little better compared to last time I saw him. There's not as much erythema surrounding the wound itself. He I did review his most recent infectious disease note which was dated 02/24/18. He saw Dr. Michel Bickers in  [place]. 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 Patient is now off of antibiotics and they are continually observing this. There seems to be no change in the past two [name] in my pinion based on what the patient says and what I see today compared to what Dr. Megan Salon likely saw two [name] ago. No fevers, chills, nausea, or vomiting noted at this time. 03/23/18 on evaluation today patient's wound actually appears to be showing signs of improvement which is good news. He is currently still on the Dapsone. He is also working on tapering the prednisone to get off of this and Dr. Lottie Rater is working with him in this regard. Nonetheless overall I feel like the wound is doing well it does appear based on the infectious disease note that I reviewed from Dr. 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 I did also review the results of his ultrasound of left lower extremity which revealed there was a dentist tissue in the base of the wound without an abscess noted. 04/06/18 on evaluation today the patient's left lateral lower extremity ulcer actually appears to be doing fairly well which is excellent news. There does not appear to be any evidence of infection at this time which is also great news. Overall he still does have a significantly large ulceration although little by little he seems to be making progress. He is down to 10 mg a day of the prednisone. 04/20/18 on evaluation today patient actually appears to be doing excellent at this time in regard to his left lower extremity ulcer. He's making signs of good progress unfortunately this is taking much longer than we would really like  to see but nonetheless he is making progress. Fortunately there does not appear to be any evidence of infection at this  time. No fevers, chills, nausea, or vomiting noted at this time. The patient has not been using the Santyl due to the cost he hadn't got in this field yet. He's mainly been using the antibiotic ointment topically. Subsequently he also tells me that he really has not been scrubbing in the shower I think this would be helpful again as I told him it doesn't have to be anything too aggressive to even make it believe just enough to keep it free of some of the loose slough and biofilm on the wound surface. 05/11/18 on evaluation today patient's wound appears to be making slow but sure progress in regard to the left lateral lower extremity ulcer. He is been tolerating the dressing changes without complication. Fortunately there does not appear to be any evidence of infection at this time. He is still just using triple antibiotic ointment along with clobetasol occasionally over the area. He never got the Santyl and really does not seem to intend to in my pinion. 06/01/18 on evaluation today patient appears to be doing a little better in regard to his left lateral lower extremity ulcer. He states that overall he does not feel like he is doing as well with the Dapsone as he did with the prednisone. Nonetheless he sees his dermatologist later today and is gonna talk to them about the possibility of going back on the prednisone. Overall again I believe that the wound would be better if you would utilize Santyl but he really does not seem to be interested in going back to the [place] at this point. He has been using triple antibiotic ointment. Electronic Signature(s) Signed: 06/01/2018 3:59:30 PM By: Worthy Keeler PA-C Entered By: Worthy [name] on 06/01/2018 14:50:29 Savard, Yacine ([id number]) RAYMONE, PEMBROKE ([id number]) -------------------------------------------------------------------------------- Physical Exam Details Patient Name: [name] Date of Service: 06/01/2018 8:00 AM Medical Record Number:  [id number] Patient Account Number: [id number] Date of Birth/Sex: 12-03-1980 (37 y.o. M) Treating RN: [name] Primary Care Provider: Alma [name] Other Clinician: Referring Provider: Alma [name] Treating Provider/Extender: [name], Langley Flatley [name] in Treatment: 25 Constitutional Obese and well-hydrated in no acute distress. Respiratory normal breathing without difficulty. clear to auscultation bilaterally. Cardiovascular regular rate and rhythm with normal S1, S2. Psychiatric this patient is able to make decisions and demonstrates good insight into disease process. Alert and Oriented x 3. pleasant and cooperative. Notes Patient's wound bed currently shows evidence of good granulation underneath some of the slough although I'm not able to get down to the base of the wound completely with what I'm doing. Obviously sharp debridement would help but in conjunction with patients dermatologist he really does not want to undergo any sharp debridement due to the potential for worsening Pyoderma gangrenosum. Electronic Signature(s) Signed: 06/01/2018 3:59:30 PM By: Worthy Keeler PA-C Entered By: Worthy [name] on 06/01/2018 14:51:12 MATHEW, POSTIGLIONE ([id number]) -------------------------------------------------------------------------------- Physician Orders Details Patient Name: [name] Date of Service: 06/01/2018 8:00 AM Medical Record Number: [id number] Patient Account Number: [id number] Date of Birth/Sex: 1980/10/22 (37 y.o. 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 Anesthetic (add to Medication List) Wound #1 Left,Lateral Lower Leg o Topical Lidocaine 4% cream applied to wound bed prior to debridement (In Clinic Only). Primary Wound Dressing Wound #1 Left,Lateral Lower Leg o Other: - Triple antibiotic ointment Secondary Dressing Wound #1 Left,Lateral Lower Leg o Boardered Foam Dressing Dressing Change Frequency Wound #1 Left,Lateral Lower Leg o Change dressing every day. Follow-up Appointments Wound #1 Left,Lateral Lower Leg o Return Appointment in 2 [name]. Edema Control Wound #1 Left,Lateral Lower Leg o Patient to wear own compression stockings o Elevate legs to the level of the heart and pump ankles as often as possible Medications-please add to medication list. Wound #1 Left,Lateral Lower Leg o Other: - Continue steroids ARDIAN, [place] ([id number]) Electronic Signature(s) Signed: 06/01/2018 3:50:02 PM By: [name] Signed: 06/01/2018 3:59:30 PM By: Worthy Keeler PA-C Entered By: [name] on 06/01/2018 08:51:16 [name] ([id number]) -------------------------------------------------------------------------------- Problem List Details Patient Name: [name] Date of Service: 06/01/2018 8:00 AM Medical Record Number: [id number] Patient Account Number: [id number] Date of Birth/Sex: 1980-12-28 (37 y.o. 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 M) Treating RN: [name] Primary Care Provider: Alma [name] Other Clinician: Referring Provider: Alma [name] Treating Provider/Extender: [name], Gilad Dugger [name] in Treatment: 81 Subjective Chief Complaint Information obtained from Patient He is here in follow up evaluation for LLE pyoderma ulcer History of Present Illness (HPI) 12/04/16; 38 year old man who comes into the clinic today for review of a wound on the posterior left calf. He tells me that is been there for about a year. He is not a diabetic he does smoke half a pack per day. He was seen in the ER on 11/20/16 felt to have cellulitis around the wound and was given clindamycin. An x-ray did not show osteomyelitis. The patient initially tells me that he has a milk allergy that sets off a pruritic itching rash on his lower legs which she scratches incessantly and he thinks that's what may have set up the wound. He has been using various topical antibiotics and ointments without any effect. He works in a [employer] and is on his feet all day. He does not have a prior history of wounds however he does have the rash on both lower legs the right arm and the ventral aspect of his left arm. These are excoriations and clearly have had scratching however there are of macular looking areas on both legs including a substantial larger area on the right leg. This does not have  an underlying open area. There is no blistering. The patient tells me that 2 years ago in [place] in response to the rash on his legs he  saw a dermatologist who told him he had a condition which may be pyoderma gangrenosum although I may be putting words into his mouth. He seemed to recognize this. 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 He is not in any treatment for this. He's had no recent travel 12/11/16; the patient arrives today with his wound and roughly the same condition we've been using silver alginate this is a deep punched out wound with some surrounding erythema but no tenderness. Biopsy I did did not show confirmed pyoderma gangrenosum suggested nonspecific inflammation and vasculitis but does not provide an actual description of what was seen by the pathologist. I'm really not able to understand this We have also received information from the patient's dermatologist in [place] notes from April 2016. This was a doctor Agarwal- antal. 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 He was prescribed topical steroid high potency under occlusion which helped but at this point the patient did not have a deep punched out wound. 12/18/16; the patient's wound is larger in terms of surface area however this surface looks better and there is less depth. The surrounding erythema also is better. The patient states that the wrap we put on came off 2 days ago when he has been using his compression stockings. He we are in the process of getting a dermatology consult. 12/26/16 on evaluation today patient's left lower extremity wound shows evidence of infection with surrounding erythema noted. He has been tolerating the dressing changes but states that he has noted more discomfort. There is a larger area of erythema surrounding the wound. No fevers, chills, nausea, or vomiting noted at this time. With that being said the wound still does have slough  covering the surface. He is not allergic to any medication that he is aware of at this point. In regard to his right lower extremity he had several regions that are erythematous and pruritic he wonders if there's anything we can do to help that. 01/02/17 I reviewed patient's wound culture which was obtained his visit last week. He was placed on doxycycline at that point. Unfortunately that does not appear to be an antibiotic that would likely help with the situation however the pseudomonas noted on culture is sensitive to Cipro. Also unfortunately patient's wound seems to have a large compared to last week's evaluation. Not severely so but there are definitely increased measurements in general. He is continuing to have discomfort as well he writes this to be a seven out of 10. In fact he would prefer me not to perform any debridement today due to the fact that he is having discomfort and considering he has an active infection on the little reluctant to do so anyway. No fevers, Raiche, Dickson ([id number]) chills, nausea, or vomiting noted at this time. 01/08/17; patient seems dermatology on September 5. I suspect dermatology will want the slides from the biopsy I did sent to their pathologist. I'm not sure if there is a way we can expedite that. In any case the culture I did before I left on vacation 3 [name] ago showed Pseudomonas he was given 10 days of Cipro and per her description of her intake nurses is actually somewhat better this week although the wound is quite a bit bigger than I remember the last time I saw this. He still has 3 more days of Cipro 01/21/17; dermatology appointment tomorrow. He has completed the ciprofloxacin for Pseudomonas. Surface of the wound looks better  however he is had some deterioration in the lesions on his right leg. Meantime the left lateral leg wound we will continue with sample 01/29/17; patient had his dermatology appointment but I can't yet see that note. He is  completed his antibiotics. The wound is more superficial but considerably larger in circumferential area than when he came in. This is in his left lateral calf. He also has swollen erythematous areas with superficial wounds on the right leg and small papular areas on both arms. There apparently areas in her his upper thighs and buttocks I did not look at those. Dermatology biopsied the right leg. Hopefully will have their input next week. 02/05/17; patient went back to see his dermatologist who told him that he had a "scratching problem" as well as staph. He is now on a 30 day course of doxycycline and I believe she gave him triamcinolone cream to the right leg areas to help with the itching [not exactly sure but probably triamcinolone]. She apparently looked at the left lateral leg wound although this was not rebiopsied and I think felt to be ultimately part of the same pathogenesis. He is using sample border foam and changing nevus himself. He now has a new open area on the right posterior leg which was his biopsy site I don't have any of the dermatology notes 02/12/17; we put the patient in compression last week with SANTYL to the wound on the left leg and the biopsy. Edema is much better and the depth of the wound is now at level of skin. Area is still the same Biopsy site on the right lateral leg we've also been using santyl with a border foam dressing and he is changing this himself. 02/19/17; Using silver alginate started last week to both the substantial left leg wound and the biopsy site on the right wound. He is tolerating compression well. Has a an appointment with his primary M.D. tomorrow wondering about diuretics although I'm wondering if the edema problem is actually lymphedema 02/26/17; the patient has been to see his primary doctor Dr. Jerrel Ivory at [facility] our primary care. She started him on Lasix 20 mg and this seems to have helped with the edema. However we are not making  substantial change with the left lateral calf wound and inflammation. The biopsy site on the right leg also looks stable but not really all that different. 03/12/17; the patient has been to see vein and vascular Dr. Lucky Cowboy. He has had venous reflux studies I have not reviewed these. I did get a call from his dermatology office. 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 They therefore recommended clobetasol and tetracycline i.e. 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 03/26/17; apparently the patient just had reflux studies not an appointment with Dr. dew. She arrives in clinic today having applied clobetasol for 2-3 [name]. He notes over the last 2-3 days excessive drainage having to change the dressing 3-4 times a day and also expanding erythema. He states the expanding erythema seems to come and go and was last this red was earlier in the month.he is on doxycycline 150 mg twice a day as an anti-inflammatory systemic therapy for possible pyoderma gangrenosum along with the topical clobetasol  04/02/17; the patient was seen last week by Dr. Lillia Carmel at [facility] dermatology locally who kindly saw him at my request. 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 My concern was the degree of erythema medially extending from his left leg wound which was either inflammation from pyoderma or cellulitis. I put him on Augmentin however culture of the wound showed Pseudomonas which is quinolone sensitive. I really don't believe he has cellulitis however in view of everything I will continue and give him a course of Cipro. He is also on doxycycline as an immune modulator for the  pyoderma. In addition to his original wound on the left lateral leg with surrounding erythema he has a wound on the right posterior calf which was an original biopsy site done by dermatology. 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 Saw Dr. Lillia Carmel yesterday. He has been using topical antibiotics to both wound areas his original wound on the left and the biopsies/pathergy area on the right. There is definitely some improvement in the inflammation around the wound on the right although the patient states he has increasing sensitivity of the wounds. He is on prednisone 60 and doxycycline 1 as prescribed by Dr. Lillia Carmel. He is covering the topical antibiotic with gauze and putting this in his own compression stocks and changing this daily. He states that Dr. Lottie Rater did a culture of the left leg wound yesterday 05/07/17; pyoderma gangrenosum. The patient saw Dr. Lillia Carmel yesterday and has a follow-up with her in one month. He is still using topical antibiotics to both wounds although he can't recall exactly what type. He is still on prednisone 60 mg. Dr. Lillia Carmel stated that the doxycycline could stop if we were in agreement. 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 The right medial leg was induced by [name] ([id number]) biopsy/pathergy. The area on the right is essentially healed. 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 The right medial leg has closed and remains closed. He is still on prednisone 60. He tells me he missed his last dermatology appointment with Dr. Lillia Carmel but will make another appointment. He reports that her blood sugar at a recent screen in [place] was high 200's. He was 180 today. 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 I think he is going to require still much longer prednisone perhaps another 3 months before attempting to taper. In the meantime his wound is a lot better. Smaller. He is cleaning this off daily and applying topical antibiotics. When he was last in the clinic I thought about changing to [facility] and actually put in a couple of calls to dermatology although probably not during their business hours. 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 He continues on oral prednisone. He has been using triple antibiotic ointment. There is surface debris and we will transition to [facility] and have him return in 2 [name]. He has lost 30 pounds since his last appointment with lifestyle modification. He may benefit from topical steroid cream for treatment this can be considered at a later date. 08/22/17 on evaluation today patient appears to actually  be doing rather well in regard to his left lateral lower extremity ulcer. He has actually been managed by Dr. Dellia Nims most recently. Patient is currently on oral steroids at this time. This seems to have been of benefit for him. Nonetheless his last visit was actually with Leah on 08/06/17. Currently he is not utilizing any topical steroid creams although this could be of benefit as well. No fevers, chills, nausea, or vomiting noted at this time. 09/05/17 on evaluation today patient appears to be doing better in regard to his left lateral lower extremity ulcer. He has been tolerating the dressing changes without complication. He is using Santyl with good effect. Overall I'm very pleased with how things are standing at this point. Patient likewise is happy that this is doing better. 09/19/17 on evaluation today patient actually appears to be doing rather well in regard to his left lateral lower extremity ulcer. Again this is  secondary to Pyoderma gangrenosum and he seems to be progressing well with the Santyl which is good news. He's not having any significant pain. 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 He has been tolerating the Santyl without complication and in general I feel like he's making good progress. 10/17/17 on evaluation today patient appears to be doing very well in regard to his left lateral lower surety ulcer. He has been tolerating the dressing changes without complication. There does not appear to be any evidence of infection he's alternating the Santyl and the triple antibiotic ointment every other day this seems to be doing well for him. 11/03/17 on evaluation today patient appears to be doing very well in regard to his left lateral lower extremity ulcer. He is been tolerating the dressing changes without complication which is good news. Fortunately there does not appear to be any evidence of infection which is also great news. Overall is doing excellent they are starting to taper down on the prednisone is down to 40 mg at this point it also started topical clobetasol for him. 11/17/17 on evaluation today patient appears to be doing well in regard to his left lateral lower surety ulcer. He's been tolerating the dressing changes without complication. He does note that he is having no pain, no excessive drainage or discharge, and overall he feels like things are going about how he would expect and hope they would. Overall he seems to have no evidence of infection at this time in my opinion which is good news. 12/04/17-He is seen in follow-up evaluation for right lateral lower extremity ulcer. He has been applying topical steroid cream. Today's measurement show slight increase in size. Over the next 2 [name] we will transition to every other day Santyl and steroid cream. He has been encouraged to  monitor for changes and notify clinic with any concerns 12/15/17 on evaluation today patient's left lateral motion the ulcer and fortunately is doing worse again at this point. This just since last week to this week has close to doubled in size according to the patient. I did not seeing last week's I do not have a visual to compare this to in our system was also down so we do not have all the charts and at this point. Nonetheless it does have me somewhat concerned in regard to the fact that again he was worried enough about it he has contact the dermatology that placed them back on the full strength, 50  mg a day of the prednisone that he was taken previous. He continues to alternate using clobetasol along with Santyl at this point. He is obviously somewhat frustrated. 12/22/17 on evaluation today patient appears to be doing a little worse compared to last evaluation. Unfortunately the wound is a little deeper and slightly larger than the last week's evaluation. With that being said he has made some progress in regard to the irritation surrounding at this time unfortunately despite that progress that's been made he still has a significant issue going on here. I'm not certain that he is having really any true infection at this time although with the Pyoderma gangrenosum it can Kenealy, Morrell ([id number]) sometimes be difficult to differentiate infection versus just inflammation. For that reason I discussed with him today the possibility of perform a wound culture to ensure there's nothing overtly infected. 01/06/18 on evaluation today patient's wound is larger and deeper than previously evaluated. With that being said it did appear that his wound was infected after my last evaluation with him. Subsequently I did end up prescribing a prescription for Bactrim DS which she has been taking and having no complication with. Fortunately there does not appear to be any evidence of infection at this point in time as  far as anything spreading, no want to touch, and overall I feel like things are showing signs of improvement. 01/13/18 on evaluation today patient appears to be even a little larger and deeper than last time. There still muscle exposed in the base of the wound. Nonetheless he does appear to be less erythematous I do believe inflammation is calming down also believe the infection looks like it's probably resolved at this time based on what I'm seeing. No fevers, chills, nausea, or vomiting noted at this time. 01/30/18 on evaluation today patient actually appears to visually look better for the most part. Unfortunately those visually this looks better he does seem to potentially have what may be an abscess in the muscle that has been noted in the central portion of the wound. This is the first time that I have noted what appears to be fluctuance in the central portion of the muscle. With that being said I'm somewhat more concerned about the fact that this might indicate an abscess formation at this location. I do believe that an ultrasound would be appropriate. This is likely something we need to try to do as soon as possible. He has been switch to mupirocin ointment and he is no longer using the steroid ointment as prescribed by dermatology he sees them again next week he's been decreased from 60 to 40 mg of prednisone. 03/09/18 on evaluation today patient actually appears to be doing a little better compared to last time I saw him. There's not as much erythema surrounding the wound itself. He I did review his most recent infectious disease note which was dated 02/24/18. He saw Dr. Michel Bickers in [place]. 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 Patient is now off of antibiotics and they are continually observing this. There seems to be no change in the past two [name] in my pinion based on what the patient  says and what I see today compared to what Dr. Megan Salon likely saw two [name] ago. No fevers, chills, nausea, or vomiting noted at this time. 03/23/18 on evaluation today patient's wound actually appears to be showing signs of improvement which  is good news. He is currently still on the Dapsone. He is also working on tapering the prednisone to get off of this and Dr. Lottie Rater is working with him in this regard. Nonetheless overall I feel like the wound is doing well it does appear based on the infectious disease note that I reviewed from Dr. 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 I did also review the results of his ultrasound of left lower extremity which revealed there was a dentist tissue in the base of the wound without an abscess noted. 04/06/18 on evaluation today the patient's left lateral lower extremity ulcer actually appears to be doing fairly well which is excellent news. There does not appear to be any evidence of infection at this time which is also great news. Overall he still does have a significantly large ulceration although little by little he seems to be making progress. He is down to 10 mg a day of the prednisone. 04/20/18 on evaluation today patient actually appears to be doing excellent at this time in regard to his left lower extremity ulcer. He's making signs of good progress unfortunately this is taking much longer than we would really like to see but nonetheless he is making progress. Fortunately there does not appear to be any evidence of infection at this time. No fevers, chills, nausea, or vomiting noted at this time. The patient has not been using the Santyl due to the cost he hadn't got in this field yet. He's mainly been using the antibiotic ointment topically. Subsequently he also tells me that he really has not been scrubbing in the shower I think  this would be helpful again as I told him it doesn't have to be anything too aggressive to even make it believe just enough to keep it free of some of the loose slough and biofilm on the wound surface. 05/11/18 on evaluation today patient's wound appears to be making slow but sure progress in regard to the left lateral lower extremity ulcer. He is been tolerating the dressing changes without complication. Fortunately there does not appear to be any evidence of infection at this time. He is still just using triple antibiotic ointment along with clobetasol occasionally over the area. He never got the Santyl and really does not seem to intend to in my pinion. 06/01/18 on evaluation today patient appears to be doing a little better in regard to his left lateral lower extremity ulcer. He states that overall he does not feel like he is doing as well with the Dapsone as he did with the prednisone. Nonetheless he sees his dermatologist later today and is gonna talk to them about the possibility of going back on the prednisone. Overall again I believe that the wound would be better if you would utilize Santyl but he really does not seem to be interested in going back to the [place] at this point. He has been using triple antibiotic ointment. PHAROAH, GOGGINS ([id number]) Patient History Information obtained from Patient. Family History Diabetes - Mother,Maternal Grandparents, Heart Disease - Mother,Maternal Grandparents, Hereditary Spherocytosis - Siblings, Hypertension - Maternal Grandparents, Stroke - Siblings, No family history of Cancer, Kidney Disease, Lung Disease, Seizures, Thyroid Problems, Tuberculosis. Social History Current some day smoker, Marital Status - Married, Alcohol Use - Rarely, Drug Use - No History, Caffeine Use - Moderate. Review of Systems (ROS) Constitutional Symptoms (General Health) Denies  complaints or symptoms of Fever, Chills. Respiratory The patient has no complaints or  symptoms. Cardiovascular The patient has no complaints or symptoms. Psychiatric The patient has no complaints or symptoms. Objective Constitutional Obese and well-hydrated in no acute distress. Vitals Time Taken: 8:10 AM, Height: 71 in, Weight: 338 lbs, BMI: 47.1, Temperature: 98.5 F, Pulse: 79 bpm, Respiratory Rate: 18 breaths/min, Blood Pressure: 128/71 mmHg. Respiratory normal breathing without difficulty. clear to auscultation bilaterally. Cardiovascular regular rate and rhythm with normal S1, S2. Psychiatric this patient is able to make decisions and demonstrates good insight into disease process. Alert and Oriented x 3. pleasant and cooperative. General Notes: Patient's wound bed currently shows evidence of good granulation underneath some of the slough although I'm not able to get down to the base of the wound completely with what I'm doing. Obviously sharp debridement would help but in conjunction with patients dermatologist he really does not want to undergo any sharp debridement due to the potential for worsening Pyoderma gangrenosum. Integumentary (Hair, Skin) Wound #1 status is Open. Original cause of wound was Gradually Appeared. The wound is located on the Left,Lateral Lower Leg. The wound measures 2.4cm length x 3.5cm width x 0.4cm depth; 6.597cm^2 area and 2.639cm^3 volume. There is muscle and Fat Layer (Subcutaneous Tissue) Exposed exposed. There is no tunneling or undermining noted. There is a large Tudor, Wang ([id number]) amount of serous drainage noted. The wound margin is distinct with the outline attached to the wound base. There is no granulation within the wound bed. There is a small (1-33%) amount of necrotic tissue within the wound bed including Adherent Slough. The periwound skin appearance exhibited: Induration, Scarring, Hemosiderin Staining. The periwound skin appearance did not exhibit: Callus, Crepitus, Excoriation, Rash, Dry/Scaly, Maceration,  Atrophie Blanche, Cyanosis, Ecchymosis, Mottled, Pallor, Rubor, Erythema. Periwound temperature was noted as No Abnormality. The periwound has tenderness on palpation. 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 Anesthetic (add to Medication List): Wound #1 Left,Lateral Lower Leg: Topical Lidocaine 4% cream applied to wound bed prior to debridement (In Clinic Only). Primary Wound Dressing: Wound #1 Left,Lateral Lower Leg: Other: - Triple antibiotic ointment Secondary Dressing: Wound #1 Left,Lateral Lower Leg: Boardered Foam Dressing Dressing Change Frequency: Wound #1 Left,Lateral Lower Leg: Change dressing every day. Follow-up Appointments: Wound #1 Left,Lateral Lower Leg: Return Appointment in 2 [name]. Edema Control: Wound #1 Left,Lateral Lower Leg: Patient to wear own compression stockings Elevate legs to the level of the heart and pump ankles as often as possible Medications-please add to medication list.: Wound #1 Left,Lateral Lower Leg: Other: - Continue steroids My suggestion currently is gonna be that we go ahead initiate the above wound care measures for the next week. The patient is in agreement with the plan. Subsequently we're gonna see were things stand at follow-up. If anything changes or worsens meantime he will contact the office and let me know. TYLEE, NEWBY ([id number]) Please see above for specific wound care orders. We will see patient for re-evaluation in two week(s) here in the clinic. If anything worsens or changes patient will contact our office for additional recommendations. Electronic Signature(s) Signed: 06/01/2018 3:59:30 PM By: Worthy Keeler PA-C Entered By: Worthy [name] on 06/01/2018 14:51:37 MCKENNA, BORUFF ([id number]) -------------------------------------------------------------------------------- ROS/PFSH Details Patient Name: [name] Date of Service: 06/01/2018 8:00 AM Medical Record  Number: [id number] Patient Account Number: [id number] Date of Birth/Sex: 11-07-1980 (37 y.o. M) Treating RN: [name] Primary Care Provider: Alma [name] Other Clinician: Referring Provider: Alma [name] Treating Provider/Extender: [name], Willliam Pettet [name] in Treatment: 37 Information Obtained From Patient Wound History Do you currently have one or more open woundso Yes How many open wounds do you currently haveo 1 Approximately how long have you had your woundso 1 year How have you been treating your wound(s) until nowo neosporin Has your wound(s) ever healed and then re-openedo No Have you had any lab work done in the past montho No Have you tested positive for an antibiotic resistant organism (MRSA, VRE)o No Have you tested positive for osteomyelitis (bone infection)o No Have you had any tests for circulation on your legso No Have you had other problems associated with your woundso Infection, Swelling Constitutional Symptoms (General Health) Complaints and Symptoms: Negative for: Fever; Chills Eyes Medical History: Negative for: Cataracts; Glaucoma; Optic Neuritis Ear/Nose/Mouth/Throat Medical History: Negative for: Chronic sinus problems/congestion; Middle ear problems Hematologic/Lymphatic Medical History: Negative for: Anemia; Hemophilia; Human Immunodeficiency Virus; Lymphedema; Sickle Cell Disease Respiratory Complaints and Symptoms: No Complaints or Symptoms Medical History: Positive for: Sleep Apnea - cpap Negative for: Aspiration; Asthma; Chronic Obstructive Pulmonary Disease (COPD); Pneumothorax Cardiovascular Complaints and Symptoms: No Complaints or Symptoms Medical HistoryJEVAN, GAUNT ([id number]) Positive for: Hypertension Negative for: Angina;  Arrhythmia; Congestive Heart Failure; Coronary Artery Disease; Deep Vein Thrombosis; Hypotension; Myocardial Infarction; Peripheral Arterial Disease; Peripheral Venous Disease; Phlebitis; Vasculitis Gastrointestinal Medical History: Positive for: Colitis Endocrine Medical History: Negative for: Type I Diabetes; Type II Diabetes Genitourinary Medical History: Negative for: End Stage Renal Disease Immunological Medical History: Negative for: Lupus Erythematosus; Raynaudos; Scleroderma Integumentary (Skin) Medical History: Negative for: History of Burn; History of pressure wounds Musculoskeletal Medical History: Negative for: Gout; Rheumatoid Arthritis; Osteoarthritis; Osteomyelitis Neurologic Medical History: Negative for: Dementia; Neuropathy; Quadriplegia; Paraplegia; Seizure Disorder Oncologic Medical History: Negative for: Received Chemotherapy; Received Radiation Psychiatric Complaints and Symptoms: No Complaints or Symptoms Medical History: Negative for: Anorexia/bulimia; Confinement Anxiety Immunizations Pneumococcal Vaccine: Received Pneumococcal Vaccination: No Implantable Devices Family and Social History LONIE, NEWSHAM ([id number]) Cancer: No; Diabetes: Yes - Mother,Maternal Grandparents; Heart Disease: Yes - Mother,Maternal Grandparents; Hereditary Spherocytosis: Yes - Siblings; Hypertension: Yes - Maternal Grandparents; Kidney Disease: No; Lung Disease: No; Seizures: No; Stroke: Yes - Siblings; Thyroid Problems: No; Tuberculosis: No; Current some day smoker; Marital Status - Married; Alcohol Use: Rarely; Drug Use: No History; Caffeine Use: Moderate; Financial Concerns: No; Food, Clothing or Shelter Needs: No; Support System Lacking: No; Transportation Concerns: No; Advanced Directives: No; Patient does not want information on Advanced Directives; Living Will: No Physician Affirmation I have reviewed and agree with the above information. Electronic  Signature(s) Signed: 06/01/2018 3:50:02 PM By: [name] Signed: 06/01/2018 3:59:30 PM By: Worthy Keeler PA-C Entered By: Worthy [name] on 06/01/2018 14:50:43 Elderkin, [name] ([id number]) -------------------------------------------------------------------------------- SuperBill Details Patient Name: [name] Date of Service: 06/01/2018 Medical Record Number: [id number] Patient Account Number: [id number] Date of Birth/Sex: 09/15/1980 (37 y.o. M) Treating RN: [name] Primary Care Provider: Alma [name] Other Clinician: Referring Provider: Alma [name] Treating Provider/Extender: [name], Kenzlee Fishburn [name] in Treatment: 37 Diagnosis Coding ICD-10 Codes Code Description 7311511146 Non-pressure chronic ulcer of left calf with fat layer exposed L88 Pyoderma gangrenosum I87.2 Venous insufficiency (chronic) (peripheral) Facility Procedures CPT4 Code: [id number] Description: 99213 - WOUND CARE VISIT-LEV 3 EST PT Modifier: Quantity: 1 Physician Procedures CPT4 Code: 7014103 Description: 01314 - WC PHYS LEVEL 4 -  EST PT ICD-10 Diagnosis Description L97.222 Non-pressure chronic ulcer of left calf with fat layer expo L88 Pyoderma gangrenosum I87.2 Venous insufficiency (chronic) (peripheral) Modifier: sed Quantity: 1 Electronic Signature(s) Signed: 06/01/2018 3:59:30 PM By: Worthy Keeler PA-C Entered By: Worthy [name] on 06/01/2018 14:51:50

## 2018-06-02 ENCOUNTER — Other Ambulatory Visit: Payer: Self-pay

## 2018-06-02 DIAGNOSIS — F3342 Major depressive disorder, recurrent, in full remission: Secondary | ICD-10-CM

## 2018-06-02 MED ORDER — VENLAFAXINE HCL ER 150 MG PO CP24: ORAL_CAPSULE | ORAL | 0 refills | Status: DC

## 2018-06-03 ENCOUNTER — Ambulatory Visit
Admit: 2018-06-03 | Discharge: 2018-06-04 | Payer: PRIVATE HEALTH INSURANCE | Attending: Dermatology | Primary: Dermatology

## 2018-06-03 DIAGNOSIS — M2550 Pain in unspecified joint: Secondary | ICD-10-CM

## 2018-06-03 DIAGNOSIS — M1990 Unspecified osteoarthritis, unspecified site: Secondary | ICD-10-CM

## 2018-06-03 DIAGNOSIS — L88 Pyoderma gangrenosum: Principal | ICD-10-CM

## 2018-06-03 DIAGNOSIS — Z79899 Other long term (current) drug therapy: Secondary | ICD-10-CM

## 2018-06-03 MED ORDER — HYDROCORTISONE 2.5 % TOPICAL CREAM
Freq: Two times a day (BID) | TOPICAL | 1 refills | 0 days | Status: CP
Start: 2018-06-03 — End: 2018-10-09

## 2018-06-03 MED ORDER — CLOBETASOL 0.05 % TOPICAL OINTMENT
Freq: Two times a day (BID) | TOPICAL | 2 refills | 0 days | Status: CP
Start: 2018-06-03 — End: 2018-09-24

## 2018-06-03 MED ORDER — DAPSONE 100 MG TABLET
ORAL_TABLET | Freq: Every day | ORAL | 2 refills | 0 days | Status: CP
Start: 2018-06-03 — End: 2018-07-08

## 2018-06-03 MED ORDER — DOXEPIN 50 MG CAPSULE
ORAL_CAPSULE | Freq: Every evening | ORAL | 3 refills | 0 days | Status: CP
Start: 2018-06-03 — End: 2018-09-24

## 2018-06-03 MED ORDER — MUPIROCIN 2 % TOPICAL OINTMENT
Freq: Two times a day (BID) | TOPICAL | 2 refills | 0.00000 days | Status: CP
Start: 2018-06-03 — End: ?

## 2018-06-03 NOTE — Unmapped (Addendum)
Dermatology Clinic Note    ASSESSMENT AND PLAN:    MRSA abscess in the setting of almost resolved pyoderma gangrenosum and chronic steroid use c/b cushingoid features, now with deep recurrent ulcer, somewhat stagnant on dapsone: Slowly improving in the setting of treatment for MRSA, treatment with dapsone, and now has weaned off of prednisone as of 12/19. Not concerning for recurrence of PG at this time. Patient starting to create new areas related to picking behaviors / itching while off of prednisone. Reviewed importance of avoiding trauma.  Discussed wearing long pants when possible to prevent access.  - increase dapsone to 200 mg daily  - cont wound care w/ wound  - currently off prednisone  - humira denied but consider second appeal given persistent recalcitrant PG in the setting of recurrent infections and side effects from prednisone with weight gain and cushingoid effects as well as potential resistance to dapsone  -  Recommend compression to alleviate venous stasis component    Pruritus / neurotic excoriations : Patient with good control on the L leg, but worsening at the R lower leg - high potential for recurrent PG in the setting of trauma induced by patient. Patient now itchy off of prednisone. 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  -failed gabapentin  -start doxepin  -start clobetasol ointment to affected areas on the R leg mixed with bactroban ointment  - cont compression / covering the areas  - consider vinegar soaks  - consider NAC    Facial xerosis w/ pruritus: May be related to weaning off of prednisone.   -start hydrocortisone 2.5 % cream    High risk medication use - dapsone: previous hiv, hep b/c unremarkable on 10/18  -quant gold neg in 9/19 (for consideration of biologic)  -g6pd nl  Reviewed risks of dapsone including anemia, severe drug reactions, liver toxicity, motor neuropathy  - CBC and differential; Future  - AST; Future  - ALT; Future  - BUN; Future  - Creatinine; Future Concerns for inflammatory arthritis vs osteoarthritis: Patient may have a worsening of symptoms secondary to cessation of chronic steroid use but now endorses acute worsening. Does have a history of IBD and PG  -referral placed to rheumatology  -urged that he visit with his PCP as well    New onset diarrhea - consider referral to GI given reported hx of UC, patient wants to monitor for now      RTC: 4  weeks  SUBJECTIVE    CC: f/u ulcers on leg    HPI:  This is a pleasant 38 y.o. male who presents today for follow up of non-healing ulcers. LV with myself on 12/19 and was stable at that time. States he is getting good regrowth without worsening of the ulcer. However now is very itchy on the R leg and finds himself picking. Has been off prednisone for 1 month. States he is having new joint pain      Disease history:  -Starting about 2013, he started to develop itchy bumps that enlarge when he scratches and picks at them. This initially affected just his lower legs, but more recently prior to initial presentation to our clinic, involvement spread to his thighs, buttocks, arms, and forearms.  -Areas start as tiny bumps that he then scratches. 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   -In spring 2018, an ulcer started on the L lateral calf and gradually enlarged, but was  slowly healing and shrinking by the time of presentation to our clinic. Denies any trauma at this site and notes that it started similar to other rashes on skin. Prior treatments include oral abx, including as recently as 12/2016 with courses of clinda and doxy, as well as topical steroids. He has h/o ulcerative colitis, now in remission without meds since 2015. Denies h/o liver or kidney disease or DM. Used to live in [place] when this started. Works for [employer] and is on feet all day, endorses swelling of legs. Denies new meds prior to onset.  - At his initial visit 01/22/2017, clobetasol ointment and doxycycline 100mg  bid x 4 weeks were started. A biopsy of a keratotic pruritic plaque was obtained. 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 Tissue culture grew 4+ MSSA. 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   - w/up for underlying pruritus (given patient report and difficulty not picking at the involved areas) on 10/17 revealed unremarkable HIV, hepatitis panel, LDH, TSH, free T4 . Outside slides from previous bx were reviewed by Dr. Eyvonne Left and showed venous stasis changes as well as a neutrophilic infiltrate that could be consistent with PG. He was started on topical clobetasol BID. He was continued on PO doxycycline for additional 6 weeks. His left leg ulcer continued to grow and the site of his previous biopsy on the posterior right leg has also developed a large non healing ulcer.   -10/18: w/up for underlying etiology of PG negative on 10/18: negative spep/upep, cbc w/diff consistently normal, and reports a known hx of ulcerative colitis which is in remssion  - Repeat biopsy as below done on 03/26/17 primarily c/w PG vs infectious with one potential organism on Fite stain but unclear if related.   - Patient started on prednisone on 04/01/17 with almost resolution by 7/19.  -Patient has recurrence of PG in the setting of MRSA - start process for humira however this was denied,   -started dapsone 50 mg on 9/19, increased to 100 mg on 9/19, increased to 200 mg 1/20  Final Diagnosis   Date Value Ref Range Status   03/26/2017   Final    Left lateral shin, punch  - Suppurative inflammation with sinus tract formation, debris, and reactive changes. See comment.          Pertinent PMH:   Ulcerative colitis, in remission per patient  HTN  DM   Denies h/o liver, kidney abnormalities     ROS: A balance of 10 systems was reviewed and negative.     PE:  General: Well-developed, well-nourished male in no acute distress, resting comfortably.  Neuro: Alert and oriented, answers questions appropriately.  Skin: Examination of face, neck, bilateral lower extremities, chest , , bilateral arms and hands was notable for:  - Left lateral lower leg with large ulcer with reepithelialized skin at the edges, ulcer is thinning  -a few crusted erosions and lichenified nodules on the R leg  -slight erythema and scale broadly on the face  -chest well moisturized and clear    -All other areas examined were normal or had no significant findings.

## 2018-06-03 NOTE — Unmapped (Signed)
Addended by: Pasty Spillers on: 06/03/2018 09:56 AM     Modules accepted: Level of Service

## 2018-06-11 ENCOUNTER — Ambulatory Visit
Admit: 2018-06-11 | Discharge: 2018-06-12 | Payer: PRIVATE HEALTH INSURANCE | Attending: Dermatology | Primary: Dermatology

## 2018-06-11 DIAGNOSIS — Z8719 Personal history of other diseases of the digestive system: Secondary | ICD-10-CM

## 2018-06-11 DIAGNOSIS — R2241 Localized swelling, mass and lump, right lower limb: Secondary | ICD-10-CM

## 2018-06-11 DIAGNOSIS — L98499 Non-pressure chronic ulcer of skin of other sites with unspecified severity: Secondary | ICD-10-CM

## 2018-06-11 DIAGNOSIS — Z79899 Other long term (current) drug therapy: Secondary | ICD-10-CM

## 2018-06-11 DIAGNOSIS — L980 Pyogenic granuloma: Principal | ICD-10-CM

## 2018-06-11 LAB — COMPREHENSIVE METABOLIC PANEL
ALBUMIN: 3.9 g/dL (ref 3.5–5.0)
ALKALINE PHOSPHATASE: 101 U/L (ref 38–126)
ALT (SGPT): 40 U/L (ref <50)
ANION GAP: 7 mmol/L (ref 7–15)
AST (SGOT): 31 U/L (ref 19–55)
BILIRUBIN TOTAL: 0.6 mg/dL (ref 0.0–1.2)
BLOOD UREA NITROGEN: 9 mg/dL (ref 7–21)
BUN / CREAT RATIO: 9
CALCIUM: 9.1 mg/dL (ref 8.5–10.2)
CHLORIDE: 100 mmol/L (ref 98–107)
CREATININE: 0.96 mg/dL (ref 0.70–1.30)
EGFR CKD-EPI AA MALE: >90 mL/min/{1.73_m2} (ref >=60)
EGFR CKD-EPI NON-AA MALE: >90 mL/min/{1.73_m2} (ref >=60)
GLUCOSE RANDOM: 70 mg/dL (ref 70–179)
POTASSIUM: 3.9 mmol/L (ref 3.5–5.0)
PROTEIN TOTAL: 6.6 g/dL (ref 6.5–8.3)

## 2018-06-11 LAB — CBC W/ AUTO DIFF
BASOPHILS ABSOLUTE COUNT: 0 10*9/L (ref 0.0–0.1)
BASOPHILS RELATIVE PERCENT: 0.5 %
EOSINOPHILS ABSOLUTE COUNT: 0.5 10*9/L — ABNORMAL HIGH (ref 0.0–0.4)
EOSINOPHILS RELATIVE PERCENT: 5.4 %
HEMATOCRIT: 37.9 % — ABNORMAL LOW (ref 41.0–53.0)
HEMOGLOBIN: 11.6 g/dL — ABNORMAL LOW (ref 13.5–17.5)
LARGE UNSTAINED CELLS: 1 % (ref 0–4)
LYMPHOCYTES ABSOLUTE COUNT: 1.7 10*9/L (ref 1.5–5.0)
LYMPHOCYTES RELATIVE PERCENT: 19 %
MEAN CORPUSCULAR HEMOGLOBIN CONC: 30.6 g/dL — ABNORMAL LOW (ref 31.0–37.0)
MEAN CORPUSCULAR VOLUME: 98.3 fL (ref 80.0–100.0)
MEAN PLATELET VOLUME: 9.1 fL (ref 7.0–10.0)
MONOCYTES ABSOLUTE COUNT: 0.7 10*9/L (ref 0.2–0.8)
NEUTROPHILS RELATIVE PERCENT: 66.2 %
PLATELET COUNT: 317 10*9/L (ref 150–440)
RED BLOOD CELL COUNT: 3.85 10*12/L — ABNORMAL LOW (ref 4.50–5.90)
RED CELL DISTRIBUTION WIDTH: 15.1 % — ABNORMAL HIGH (ref 12.0–15.0)
WBC ADJUSTED: 8.8 10*9/L (ref 4.5–11.0)

## 2018-06-11 LAB — RED BLOOD CELL COUNT: Lab: 3.85 — ABNORMAL LOW

## 2018-06-11 LAB — POTASSIUM: Potassium:SCnc:Pt:Ser/Plas:Qn:: 3.9

## 2018-06-11 LAB — BITE CELLS

## 2018-06-11 LAB — SLIDE REVIEW

## 2018-06-11 MED ORDER — PREDNISONE 10 MG TABLET
ORAL_TABLET | Freq: Every day | ORAL | 0 refills | 0.00000 days | Status: CP
Start: 2018-06-11 — End: 2018-09-24

## 2018-06-11 MED ORDER — DOXYCYCLINE HYCLATE 100 MG TABLET/CAPSULE WRAPPER
Freq: Two times a day (BID) | ORAL | 0 refills | 0.00000 days | Status: CP
Start: 2018-06-11 — End: 2018-07-14

## 2018-06-11 NOTE — Unmapped (Signed)
Called pt back after message on my chart stating that his leg is infected, pt states that he has pain, warm to touch and swelling, pt was advised to go to urgent care but he stated that he wanted to be seen in Dermatology and not urgent care.

## 2018-06-11 NOTE — Unmapped (Addendum)
-   Continue Dapsone 200mg  daily; we will be working on getting remicade ordered and covered by insurance, so continue Dapsone until then  - Start doxycycline 100mg  twice daily - take with food and full glass of water; protect your skin when outside because it can cause sun sensitivity   - Start prednisone 40mg  daily if needed  - Go to the radiology department to have an ultrasound performed on the right lower leg [phone] - call before you get there!)  - Get blood work and we will call you with the results  - Call and make a follow-up appointment with infectious disease

## 2018-06-11 NOTE — Unmapped (Signed)
Dermatology Clinic Note    ASSESSMENT AND PLAN:    Draining, crusted skin ulcerations on the right lower extremity at the site of prior excoriations concerning for development of a secondary infection and risk of developing into pyoderma grangrenosum in the setting of trauma  - Unilateral swelling, erythema, drainage, tenderness and warmth concerning for possible secondary infection. Other considerations include development of a lower extremity DVT.  - Aerobic swab collected today; will empirically treat with doxycycline given susceptibility of prior culture to tetracyclines. Andrew Cameron has previously responded well to bactrim, however given his current use of dapsone, is unable to take bactrim.  - Start doxycycline 100mg  BID with food and a full glass of water - discussed risks of stomach upset, sun sensitivity and erosive esophagitis.  - Will send for a doppler ultrasound of the right lower extremity to rule out a DVT  - Discussed risk of developing new PG lesions in areas of trauma and likely need to escalate from dapsone to remicade infusions for better control of pyoderma gangrenosum  - Will start process of ordering remicade infusions at this time  - Continue dapsone 200 mg daily until remicade infusions are available; will check follow-up lab work as below  - Discussed pros and cons of re-starting prednisone to control potential flare. 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 However, would provide improved control of lower extremity lesions and would help with addressing systemic side effects, as these may be associated with his current flaring.   - Will give a paper prescription for prednisone 40mg  daily. Do not start right away, but can do so if symptoms worsen.  - Continue doxepin 50 mg nightly and gabapentin 300mg  in the afternoon and 600mg  nightly to help control the itch  - Consider follow-up with infectious disease physicians in the event that there is resistance to doxycycline on culture.    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:  - Currently off of long-term high-dose prednisone use that was complicated by development of cushingoid features and development of diabetes mellitus; stable while currently on dapsone  - Humira previously ordered and denied by insurance  - Continue follow-up with wound care  - Continue dapsone 200mg  daily  - Continue wearing compression stockings    High risk medication use - dapsone: previous hiv, hep b/c unremarkable on 10/18; quant gold neg in 9/19 (for consideration of biologic); g6pd normal  Reviewed risks of dapsone including anemia, severe drug reactions, liver toxicity, motor neuropathy  - check CBC  - Will check CMP today to include blood glucose given symptoms that could be secondary to poorly controlled blood sugar; would like to assess blood sugar status prior to him potentially re-starting high dose prednisone to control and acute PG flare    Dark stools in the setting of prior diagnosis of ulcerative colitis  - Referral sent to gastroenterology      RTC: 1 week     SUBJECTIVE    CC: Evaluation for possible infection of right lower leg    HPI:  This is a pleasant Andrew Cameron with a history of pyoderma gangrenosum and MRSA infection who was last seen by Dr. Carles Collet on 06/03/2018 presents today for evaluation of a possible infection on his right leg. At his last appointment Andrew Cameron was given clobetasol and mupirocin to mix together and apply  to neurotic excoriations on his right lower leg under occlusion to prevent development of new PG lesions. Andrew Cameron states that Andrew Cameron applied the ointments four days ago with a compression stocking overlying, and removed it two days ago. When Andrew Cameron removed the compression stocking, Andrew Cameron noted that his right lower extremity was significantly swollen, red, and warm. Andrew Cameron has pain in his leg when Andrew Cameron walks. It is less itchy and more weepy than it was previously. Andrew Cameron has been taking the gabapentin, dapsone and doxepin as prescribed. Andrew Cameron reports that his PG lesion has been stable since the last appointment. Andrew Cameron has not had fever, but has had chills and joint aches. Andrew Cameron states that his diarrhea has subsided, but Andrew Cameron does have darker than usual stools, dry mouth, and polydipsia, but denies polyuria or blurred vision. Andrew Cameron does have diabetes mellitus likely due to long term high dose prednisone use, but does not check his blood sugar regularly due to only taking oral agents for glycemic control.     Disease history:  -Starting about 2013, Andrew Cameron started to develop itchy bumps that enlarge when Andrew Cameron scratches and picks at them. This initially affected just his lower legs, but more recently prior to initial presentation to our clinic, involvement spread to his thighs, buttocks, arms, and forearms.  -Areas start as tiny bumps that Andrew Cameron then scratches. 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   -In spring 2018, an ulcer started on the L lateral calf and gradually enlarged, but was slowly healing and shrinking by the time of presentation to our clinic. Denies any trauma at this site and notes that it started similar to other rashes on skin. Prior treatments include oral abx, including as recently as 12/2016 with courses of clinda and doxy, as well as topical steroids. Andrew Cameron has h/o ulcerative colitis, now in remission without meds since 2015. Denies h/o liver or kidney disease or DM. Used to live in [place] when this started. Works for [employer] and is on feet all day, endorses swelling of legs. Denies new meds prior to onset.  - At his initial visit 01/22/2017, clobetasol ointment and doxycycline 100mg  bid x 4 weeks were started. A biopsy of a keratotic pruritic plaque was obtained. 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 Tissue culture grew 4+ MSSA. 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   - w/up for underlying pruritus (given patient report and difficulty not picking at the involved areas) on 10/17 revealed unremarkable HIV, hepatitis panel, LDH, TSH, free T4 . Outside slides from previous bx were reviewed by Dr. Eyvonne Left and showed venous stasis changes as well as a neutrophilic infiltrate that could be consistent with PG. Andrew Cameron was started on topical clobetasol BID. Andrew Cameron was continued on PO doxycycline for additional 6 weeks. His left leg ulcer continued to grow and the site of his previous biopsy on the posterior right leg has also developed a large non healing ulcer.   -10/18: w/up for underlying etiology of PG negative on 10/18: negative spep/upep, cbc w/diff consistently normal, and reports a known hx of ulcerative colitis which is in remssion  - Repeat biopsy as below done on 03/26/17 primarily c/w PG vs infectious with one potential organism on Fite stain but unclear if related.   - Patient started on prednisone on 04/01/17 with almost resolution by 7/19.  -Patient has recurrence of PG in the setting of MRSA - start process  for humira however this was denied,   -started dapsone 50 mg on 9/19, increased to 100 mg on 9/19, increased to 200 mg 1/20    Final Diagnosis   Date Value Ref Range Status   03/26/2017   Final    Left lateral shin, punch  - Suppurative inflammation with sinus tract formation, debris, and reactive changes. See comment.          Pertinent PMH:   Ulcerative colitis, in remission per patient  HTN  DM   Denies h/o liver, kidney abnormalities     ROS: A balance of 10 systems was reviewed and negative.     PE:  General: Well-developed, well-nourished Cameron in no acute distress, resting comfortably.  Neuro: Alert and oriented, answers questions appropriately.  Skin: Examination of face, neck, bilateral lower extremities, chest, bilateral arms and hands was notable for:  - Right lower leg with non-pitting edema and multiple crusted erosions on the posterior, lateral and anterior lower leg with mild drainage and surrounding erythema and warmth. Scaling of the right posterior calf. Few excoriations noted on the right lower leg.  -All other areas examined were normal or had no significant findings.

## 2018-06-11 NOTE — Unmapped (Signed)
Patient was not able to make the 2:00 pm appointment but he is able to come in earlier. Was it okay to still offer the 11:00 am in [place] today?

## 2018-06-12 ENCOUNTER — Ambulatory Visit: Admit: 2018-06-12 | Discharge: 2018-06-13 | Payer: PRIVATE HEALTH INSURANCE

## 2018-06-12 DIAGNOSIS — R2241 Localized swelling, mass and lump, right lower limb: Principal | ICD-10-CM

## 2018-06-12 DIAGNOSIS — M7989 Other specified soft tissue disorders: Secondary | ICD-10-CM | POA: Diagnosis not present

## 2018-06-12 DIAGNOSIS — R590 Localized enlarged lymph nodes: Secondary | ICD-10-CM | POA: Diagnosis not present

## 2018-06-12 DIAGNOSIS — L97919 Non-pressure chronic ulcer of unspecified part of right lower leg with unspecified severity: Secondary | ICD-10-CM | POA: Diagnosis not present

## 2018-06-12 NOTE — Unmapped (Signed)
I saw and evaluated the patient, participating in the key portions of the service.?? I reviewed the resident???s note.?? I agree with the resident???s findings and plan. Everrett Coombe, MD

## 2018-06-15 ENCOUNTER — Encounter: Payer: 59 | Admitting: Physician Assistant

## 2018-06-15 DIAGNOSIS — L97225 Non-pressure chronic ulcer of left calf with muscle involvement without evidence of necrosis: Secondary | ICD-10-CM | POA: Diagnosis not present

## 2018-06-15 DIAGNOSIS — E11622 Type 2 diabetes mellitus with other skin ulcer: Secondary | ICD-10-CM | POA: Diagnosis not present

## 2018-06-15 DIAGNOSIS — L88 Pyoderma gangrenosum: Secondary | ICD-10-CM | POA: Diagnosis not present

## 2018-06-16 NOTE — Unmapped (Signed)
06/16/2018- Informed patient of his results and no evidence of DVT. See result note from blood work from the same day. LNC

## 2018-06-16 NOTE — Unmapped (Signed)
06/16/2018 - Called and informed patient that his culture revealed MSSA, but that it is susceptible to doxycycline, so she should continue on that. Also discussed his new anemia with bite cells concerning for hemolysis 2/2 high dose dapsone. Recommend that he decrease his dapsone to 150mg  daily and to have his blood work re-checked at his follow-up appointment to make sure his hemoglobin is trending in the right direction.     Also informed him that his ultrasound showed that there were no clots visualized in his leg, but there was some reactive lymphadenopathy, likely due toe the cellulitis present in his leg.    He notes that this morning his leg felt that it had improved and that the swelling had gone down, but as the day has progressed he feels like it has slowly become more swollen again. The redness of his leg has not changed. The lesions are draining less since being on the doxycycline. He continues to have pain in his leg and low back, shortness of breath, fatigue, diarrhea, and chills. He checked his temperature and he was afebrile. He has an appointment with his PCP on Thursday.     Educated that his shortness of breath and fatigue may be a result of his new-found anemia, and hopefully decreasing the dose of the dapsone will help resolve that issue. He should also follow-up with GI (referral already sent in) to be evaluated for possible ulcerative colitis flare given his GI symptoms, anemia and fatigue. Also recommend that he move his appointment with his PCP up to tomorrow so that his leg can be re-evaluated. Based on sensitivities, doxycycline should be adequate, but if clinically appears worrisome tomorrow, may need a different antibiotic or IV antibiotics. Recommend that he go to the ED if he develops fever, worsening red streaking, worsening pain or rigors. He understood and agrees with the treatment plan. LNC

## 2018-06-17 ENCOUNTER — Ambulatory Visit (INDEPENDENT_AMBULATORY_CARE_PROVIDER_SITE_OTHER)
Admission: RE | Admit: 2018-06-17 | Discharge: 2018-06-17 | Disposition: A | Discharge status: Home / Self Care | Payer: 59 | Source: Ambulatory Visit | Attending: Primary Care | Admitting: Primary Care

## 2018-06-17 ENCOUNTER — Ambulatory Visit (INDEPENDENT_AMBULATORY_CARE_PROVIDER_SITE_OTHER): Payer: 59 | Admitting: Primary Care

## 2018-06-17 VITALS — BP 140/74 | HR 102 | Temp 98.4°F | Ht 72.0 in | Wt 368.0 lb

## 2018-06-17 DIAGNOSIS — E11622 Type 2 diabetes mellitus with other skin ulcer: Secondary | ICD-10-CM | POA: Diagnosis not present

## 2018-06-17 DIAGNOSIS — L03115 Cellulitis of right lower limb: Secondary | ICD-10-CM | POA: Diagnosis not present

## 2018-06-17 DIAGNOSIS — R0602 Shortness of breath: Secondary | ICD-10-CM

## 2018-06-17 DIAGNOSIS — I878 Other specified disorders of veins: Secondary | ICD-10-CM

## 2018-06-17 DIAGNOSIS — Z720 Tobacco use: Secondary | ICD-10-CM

## 2018-06-17 DIAGNOSIS — R600 Localized edema: Secondary | ICD-10-CM

## 2018-06-17 DIAGNOSIS — R05 Cough: Secondary | ICD-10-CM | POA: Diagnosis not present

## 2018-06-17 DIAGNOSIS — L88 Pyoderma gangrenosum: Secondary | ICD-10-CM

## 2018-06-17 LAB — TSH: TSH: 3.05 u[IU]/mL (ref 0.35–4.50)

## 2018-06-17 LAB — CBC WITH DIFFERENTIAL/PLATELET
Basophils Absolute: 0 10*3/uL (ref 0.0–0.1)
Basophils Relative: 0.5 % (ref 0.0–3.0)
Eosinophils Absolute: 0.6 10*3/uL (ref 0.0–0.7)
Eosinophils Relative: 6.4 % — ABNORMAL HIGH (ref 0.0–5.0)
HCT: 35.8 % — ABNORMAL LOW (ref 39.0–52.0)
Hemoglobin: 11.8 g/dL — ABNORMAL LOW (ref 13.0–17.0)
Lymphocytes Relative: 23.8 % (ref 12.0–46.0)
Lymphs Abs: 2.4 10*3/uL (ref 0.7–4.0)
MCHC: 33 g/dL (ref 30.0–36.0)
MCV: 93.8 fl (ref 78.0–100.0)
Monocytes Absolute: 0.9 10*3/uL (ref 0.1–1.0)
Monocytes Relative: 9.5 % (ref 3.0–12.0)
Neutro Abs: 5.9 10*3/uL (ref 1.4–7.7)
Neutrophils Relative %: 59.8 % (ref 43.0–77.0)
Platelets: 278 10*3/uL (ref 150.0–400.0)
RBC: 3.82 Mil/uL — ABNORMAL LOW (ref 4.22–5.81)
RDW: 14.5 % (ref 11.5–15.5)
WBC: 9.9 10*3/uL (ref 4.0–10.5)

## 2018-06-17 LAB — BRAIN NATRIURETIC PEPTIDE: Pro B Natriuretic peptide (BNP): 10 pg/mL (ref 0.0–100.0)

## 2018-06-17 LAB — HEMOGLOBIN A1C: Hgb A1c MFr Bld: 3.5 % — ABNORMAL LOW (ref 4.6–6.5)

## 2018-06-17 MED ORDER — VARENICLINE TARTRATE 1 MG PO TABS: 1.0000 mg | ORAL_TABLET | Freq: Two times a day (BID) | ORAL | 0 refills | Status: DC

## 2018-06-17 NOTE — Unmapped (Signed)
Called patient to check in this morning. Stable overnight without progression of symptoms. Going to primary care today due to SOB, fatigue, back pain. Provided personal cell number to patient to touch base with me after pcp visit (reviewed that this is not an emergency number that is fully staffed). Recommended that he ask his pcp to obtain methemoglobin level - if pcp is uncomfortable, discussed that I can also order for a [facility] lab.  Patient expressed his understanding.

## 2018-06-17 NOTE — Assessment & Plan Note (Signed)
Following with dermatology, wound clinic, infectious disease.   Left ulcer overall improved, seems like he may have contracted to his right side. Recommended he follow back up with infectious disease, continue dermatology and wound consultations.

## 2018-06-17 NOTE — Assessment & Plan Note (Signed)
Unclear etiology, could be secondary to a multitude of reasons. Differentials include morbid obesity, weight gain secondary to increased lower extremity edema, history of tobacco abuse, anemia. Respiratory exam today overall unremarkable. Check chest x-ray today.  Labs pending including BNP, A1c, CBC.

## 2018-06-17 NOTE — Assessment & Plan Note (Addendum)
Appears to be improving on doxycycline course. CBC pending. Continue doxycycline.

## 2018-06-17 NOTE — Assessment & Plan Note (Signed)
Chronic and appears to be progressing in the setting of new sores to the right lower extremity. Recent ultrasound ruled out DVT.  Continue follow-up with dermatology, wound clinic, infectious disease.

## 2018-06-17 NOTE — Patient Instructions (Addendum)
Stop by the lab and xray prior to leaving today. I will notify you of your results once received.   Continue Chantix, I sent in the continuing pack.  Continue Doxycycline as prescribed.   Please follow up with your dermatologist.  It was a pleasure to see you today!

## 2018-06-17 NOTE — Progress Notes (Signed)
Subjective:    Patient ID: [name], male    DOB: 1980/12/26, 38 y.o.   MRN: [id number]  HPI  [name] is a 38 year old male with a history of chronic ulcer with pyoderma gangrenosum, venous stasis, morbid obesity who presents today with a chief complaint of lower extremity erythema and edema.  He was last evaluated by his dermatologist on 06/11/18 for follow up of chronic ulcer. He endorsed right lower extremity edema with erythema.  He had also been struggling with new sores to the right lower extremity. He was initiated on doxycycline 100 BID for potential infection, also provided with a prescription for  prednisone 40 mg daily to start if needed. He was also sent for an ultrasound of his right lower extremity to rule out DVT.   His wound culture revealed MSSA so doxycycline was continued. Dapsone was decreased to 150 mg. Lower extremity ultrasound was negative for clot, but suspicious for cellulitis to his extremity. Labs showed anemia. He was advised to follow up with PCP today for re-evaluation of his lower extremity cellulitis.  He was referred to GI for follow-up of anemia given his history of ulcerative colitis.  Today he reports lower thoracic back pain, lower back pain, joint pain to wrists/elbows/fingers, fatigue. He endorses sleeping well, compliant to CPAP machine. He's also continued to notice drainage from his wound site, some drying out. He is compliant to his doxycycline.   His joint aches began about one month ago when he was weaned off of prednisone. He had been on prednisone for the last one year. He has noticed dark stools, alternating constipation with diarrhea. He denies abdominal pain, vomiting. He has yet to hear back from GI for the follow up appointment.   Also with shortness of breath with exertion including walking down the hallway, walking up one flight of stairs at home. He has been weighing himself at home, 3 weeks ago he was 354, two weeks ago was 349. He did  increase sweets around the holidays, no other abrupt changes in diet. He quit smoking three weeks ago, is taking Chantix. Denies shortness of breath when smoking, chest pain.   Wt Readings from Last 3 Encounters:  06/17/18 (!) 368 lb (166.9 kg)  04/28/18 (!) 358 lb (162.4 kg)  03/17/18 (!) 360 lb (163.3 kg)     Review of Systems  Constitutional: Negative for fever.  Respiratory: Positive for shortness of breath. Negative for cough.   Cardiovascular: Positive for leg swelling. Negative for chest pain.  Gastrointestinal: Negative for abdominal pain.       Dark stools  Skin: Positive for wound.  Neurological: Negative for dizziness and weakness.       Past Medical History:  Diagnosis Date  . Blood in stool   . Depression   . Elevated blood pressure   . Hyperlipidemia   . OSA (obstructive sleep apnea) 2014  . Seasonal allergies   . Ulcerative colitis (HCC) 03/05/2018     Social History   Socioeconomic History  . Marital status: Married    Spouse name: Not on file  . Number of children: Not on file  . Years of education: Not on file  . Highest education level: Not on file  Occupational History  . Not on file  Social Needs  . Financial resource strain: Not on file  . Food insecurity:    Worry: Not on file    Inability: Not on file  . Transportation needs:  Medical: Not on file    Non-medical: Not on file  Tobacco Use  . Smoking status: Former Games developer  . Smokeless tobacco: Former Neurosurgeon    Quit date: 03/17/2015  . Tobacco comment: Quit in [place] smoker for 9 years  Substance and Sexual Activity  . Alcohol use: Yes    Alcohol/week: 0.0 standard drinks    Comment: soical  . Drug use: Not on file  . Sexual activity: Not on file  Lifestyle  . Physical activity:    Days per week: Not on file    Minutes per session: Not on file  . Stress: Not on file  Relationships  . Social connections:    Talks on phone: Not on file    Gets together: Not on file     Attends religious service: Not on file    Active member of club or organization: Not on file    Attends meetings of clubs or organizations: Not on file    Relationship status: Not on file  . Intimate partner violence:    Fear of current or ex partner: Not on file    Emotionally abused: Not on file    Physically abused: Not on file    Forced sexual activity: Not on file  Other Topics Concern  . Not on file  Social History Narrative   Married.   7 kids.   Works as a Magazine features editor at [employer].   Enjoys target shooting.     Past Surgical History:  Procedure Laterality Date  . SEPTOPLASTY  2007  . WRIST SURGERY      Family History  Problem Relation Age of Onset  . Alcohol abuse Paternal Aunt   . Alcohol abuse Paternal Uncle   . Stroke Paternal Uncle   . Hyperlipidemia Maternal Grandfather   . Hypertension Maternal Grandfather   . Diabetes Maternal Grandfather   . Alcohol abuse Maternal Grandfather   . Multiple sclerosis Mother   . Dementia Mother     Allergies  Allergen Reactions  . Biaxin [Clarithromycin] Other (See Comments)    Causes colitis flares  . Milk-Related Compounds Hives    Current Outpatient Medications on File Prior to Visit  Medication Sig Dispense Refill  . acetaminophen (TYLENOL) 500 MG tablet Take 1,000 mg by mouth every 6 (six) hours as needed for headache (pain).    . cetirizine (ZYRTEC) 10 MG tablet Take 1 tablet (10 mg total) by mouth daily. 90 tablet 1  . clobetasol ointment (TEMOVATE) 0.05 % Apply 1 application topically 2 (two) times daily.     . dapsone 100 MG tablet Take 200 mg by mouth daily.     Marland Kitchen doxepin (SINEQUAN) 50 MG capsule Take 50 mg by mouth at bedtime.    Marland Kitchen doxycycline (VIBRA-TABS) 100 MG tablet Take 100 mg by mouth 2 (two) times daily.     Marland Kitchen gabapentin (NEURONTIN) 300 MG capsule Take 300 mg by mouth 2 (two) times daily.    . hydrocortisone 2.5 % cream Apply 1 application topically 2 (two) times daily.    Marland Kitchen ibuprofen (ADVIL,MOTRIN)  200 MG tablet Take 400-600 mg by mouth every 6 (six) hours as needed for headache (pain).    . metFORMIN (GLUCOPHAGE) 1000 MG tablet Take 1 tablet (1,000 mg total) by mouth 2 (two) times daily with a meal. 180 tablet 3  . mupirocin ointment (BACTROBAN) 2 % Apply topically two (2) 60g times a day. Mix with clobetasol and apply twice daily    . ranitidine (  ZANTAC) 150 MG tablet Take 150 mg by mouth 2 (two) times daily as needed for heartburn (acid reflux).     . venlafaxine XR (EFFEXOR-XR) 150 MG 24 hr capsule TAKE 1 CAPSULE BY MOUTH  DAILY WITH BREAKFAST 90 capsule 0   No current facility-administered medications on file prior to visit.     Pulse (!) 102   Temp 98.4 F (36.9 C) (Oral)   Ht 6' (1.829 m)   Wt (!) 368 lb (166.9 kg)   SpO2 98%   BMI 49.91 kg/m    Objective:   Physical Exam  Constitutional: He appears well-nourished.  Cardiovascular: Normal rate and regular rhythm.  Respiratory: Effort normal and breath sounds normal. He has no wheezes.  Skin: Skin is warm and dry. There is erythema.  Mild to moderate erythema and edema to right lower extremity distal to knee.  Several closed scaly sores, no drainage.           Assessment & Plan:

## 2018-06-17 NOTE — Assessment & Plan Note (Signed)
Quit smoking 3 weeks ago on Chantix.  Commended him for the success. Refill provided for continuing pack.  He will update.

## 2018-06-18 ENCOUNTER — Ambulatory Visit: Payer: 59 | Admitting: Primary Care

## 2018-06-18 NOTE — Unmapped (Signed)
Called patient - will hold dapsone in setting of sob and fatigue. Ordered remicade.

## 2018-06-21 NOTE — Progress Notes (Signed)
[name], [name] ([id number]) Visit Report for 06/15/2018 Chief Complaint Document Details Patient Name: [name], [name] Date of Service: 06/15/2018 8:00 AM Medical Record Number: [id number] Patient Account Number: [id number] Date of Birth/Sex: 1980-10-31 (38 y.o. 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 M) Treating RN: [name] Primary Care Provider: Alma [name] Other Clinician: Referring Provider: Alma [name] Treating Provider/Extender: [name], [name] [name] in Treatment: 40 History of Present Illness HPI Description: 12/04/16; 38 year old man who comes into the clinic today for review of a wound on the posterior left calf. He tells me that is been there for about a year. He is not a diabetic he does smoke half a pack per day. He was seen in the ER on 11/20/16 felt to have cellulitis around the wound and was given clindamycin. An x-ray did not show osteomyelitis. The patient initially tells me that he has a milk allergy that sets off a pruritic itching rash on his lower legs which she scratches incessantly and he thinks that's what may have set up the wound. He has been using various topical antibiotics and ointments without any effect. He works in a [employer] and is on his feet all day. He does not have a prior  history of wounds however he does have the rash on both lower legs the right arm and the ventral aspect of his left arm. These are excoriations and clearly have had scratching however there are of macular looking areas on both legs including a substantial larger area on the right leg. This does not have an underlying open area. There is no blistering. The patient tells me that 2 years ago in [place] in response to the rash on his legs he saw a dermatologist who told him he had a condition which may be pyoderma gangrenosum although I may be putting words into his mouth. He seemed to recognize this. 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 He is not in any treatment for this. He's had no recent travel 12/11/16; the patient arrives today with his wound and roughly the same condition we've been using silver alginate this is a deep punched out wound with some surrounding erythema but no tenderness. Biopsy I did did not show confirmed pyoderma gangrenosum suggested nonspecific inflammation and vasculitis but does not provide an actual description of what was seen by the pathologist. I'm really not able to understand this We have also received information from the patient's dermatologist in [place] notes from April 2016. This was a doctor Agarwal- antal. 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 He was prescribed topical steroid high potency under occlusion which helped but at this point the patient did not have a deep punched out wound. 12/18/16; the patient's wound is larger in terms of surface area however this surface looks better and there is less depth. The surrounding erythema also is better. The patient states that the wrap we put on came off 2 days ago when he has been using  his compression stockings. He we are in the process of getting a dermatology consult. 12/26/16 on evaluation today patient's left lower extremity wound shows evidence of  infection with surrounding erythema noted. He has been tolerating the dressing changes but states that he has noted more discomfort. There is a larger area of erythema surrounding the wound. No fevers, chills, nausea, or vomiting noted at this time. With that being said the wound still does have slough covering the surface. He is not allergic to any medication that he is aware of at this point. In regard to his right lower extremity he had several regions that are erythematous and pruritic he wonders if there's anything we can do to help that. 01/02/17 I reviewed patient's wound culture which was obtained his visit last week. He was placed on doxycycline at that point. Unfortunately that does not appear to be an antibiotic that would likely help with the situation however the pseudomonas noted on culture is sensitive to Cipro. Also unfortunately patient's wound seems to have a large compared to last week's evaluation. Not severely so but there are definitely increased measurements in general. He is continuing to have discomfort as well he writes this to be a seven out of 10. In fact he would prefer me not to perform any debridement today due to the fact that he is having discomfort and considering he has an active infection on the little reluctant to do so anyway. No fevers, chills, nausea, or vomiting noted at this time. 01/08/17; patient seems dermatology on September 5. I suspect dermatology will want the slides from the biopsy I did sent to their pathologist. I'm not sure if there is a way we can expedite that. In any case the culture I did before I left on vacation 3 [name] ago showed Pseudomonas he was given 10 days of Cipro and per her description of her intake nurses is actually somewhat better this week although the wound is quite a bit bigger than I remember the last time I saw this. He still has 3 more days of Cipro [name], [name] ([id number]) 01/21/17; dermatology appointment tomorrow. He has  completed the ciprofloxacin for Pseudomonas. Surface of the wound looks better however he is had some deterioration in the lesions on his right leg. Meantime the left lateral leg wound we will continue with sample 01/29/17; patient had his dermatology appointment but I can't yet see that note. He is completed his antibiotics. The wound is more superficial but considerably larger in circumferential area than when he came in. This is in his left lateral calf. He also has swollen erythematous areas with superficial wounds on the right leg and small papular areas on both arms. There apparently areas in her his upper thighs and buttocks I did not look at those. Dermatology biopsied the right leg. Hopefully will have their input next week. 02/05/17; patient went back to see his dermatologist who told him that he had a "scratching problem" as well as staph. He is now on a 30 day course of doxycycline and I believe she gave him triamcinolone cream to the right leg areas to help with the itching [not exactly sure but probably triamcinolone]. She apparently looked at the left lateral leg wound although this was not rebiopsied and I think felt to be ultimately part of the same pathogenesis. He is using sample border foam and changing nevus himself. He now has a new open area on the right posterior leg which was his biopsy site I don't  have any of the dermatology notes 02/12/17; we put the patient in compression last week with SANTYL to the wound on the left leg and the biopsy. Edema is much better and the depth of the wound is now at level of skin. Area is still the same oBiopsy site on the right lateral leg we've also been using santyl with a border foam dressing and he is changing this himself. 02/19/17; Using silver alginate started last week to both the substantial left leg wound and the biopsy site on the right wound. He is tolerating compression well. Has a an appointment with his primary M.D. tomorrow  wondering about diuretics although I'm wondering if the edema problem is actually lymphedema 02/26/17; the patient has been to see his primary doctor Dr. Jerrel Ivory at [facility] our primary care. She started him on Lasix 20 mg and this seems to have helped with the edema. However we are not making substantial change with the left lateral calf wound and inflammation. The biopsy site on the right leg also looks stable but not really all that different. 03/12/17; the patient has been to see vein and vascular Dr. Lucky Cowboy. He has had venous reflux studies I have not reviewed these. I did get a call from his dermatology office. 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 They therefore recommended clobetasol and tetracycline i.e. 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 03/26/17; apparently the patient just had reflux studies not an appointment with Dr. dew. She arrives in clinic today having applied clobetasol for 2-3 [name]. He notes over the last 2-3 days excessive drainage having to change the dressing 3-4 times a day and also expanding erythema. 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 Lillia Carmel at [facility] dermatology locally who kindly saw him at my request. 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 My concern was the degree of erythema medially extending from his left leg wound which was either inflammation from  pyoderma or cellulitis. I put him on Augmentin however culture of the wound showed Pseudomonas which is quinolone sensitive. I really don't believe he has cellulitis however in view of everything I will continue and give him a course of Cipro. He is also on doxycycline as an immune modulator for the pyoderma. In addition to his original wound on the left lateral leg with surrounding erythema he has a wound on the right posterior calf which was an original biopsy site done by dermatology. 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 Saw Dr. Lillia Carmel yesterday. He has been using topical antibiotics to both wound areas his original wound on the left and the biopsies/pathergy area on the right. There is definitely some improvement in the inflammation around the wound on the right although the patient states he has increasing sensitivity of the wounds. He is on prednisone 60 and doxycycline 1 as prescribed by Dr. Lillia Carmel. He is covering the topical  antibiotic with gauze and putting this in his own compression stocks and changing this daily. He states that Dr. Lottie Rater did a culture of the left leg wound yesterday 05/07/17; pyoderma gangrenosum. The patient saw Dr. Lillia Carmel yesterday and has a follow-up with her in one month. He is still using topical antibiotics to both wounds although he can't recall exactly what type. He is still on prednisone 60 mg. Dr. Lillia Carmel stated that the doxycycline could stop if we were in agreement. 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 The right medial leg was induced by biopsy/pathergy. The area on the right is essentially healed. 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 The right medial leg has closed and  remains closed. He is still on prednisone 60. oHe tells me he missed his last dermatology appointment with Dr. Lillia Carmel but will make another appointment. He reports that her blood sugar at a recent screen in [place] was high 200's. He was 180 today. He is more cushingoid blood pressure is [place], [place] ([id number]) up a bit. I think he is going to require still much longer prednisone perhaps another 3 months before attempting to taper. In the meantime his wound is a lot better. Smaller. He is cleaning this off daily and applying topical antibiotics. When he was last in the clinic I thought about changing to [facility] and actually put in a couple of calls to dermatology although probably not during their business hours. 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 He continues on oral prednisone. He has been using triple antibiotic ointment. There is surface debris and we will transition to [facility] and have him return in 2 [name]. He has lost 30 pounds since his last appointment with lifestyle modification. He may benefit from topical steroid cream for treatment this can be considered at a later date. 08/22/17 on evaluation today patient appears to actually be doing rather well in regard to his left lateral lower extremity ulcer. He has actually been managed by Dr. Dellia Nims most recently. Patient is currently on oral steroids at this time. This seems to have been of benefit for him. Nonetheless his last visit was actually with Leah on 08/06/17. Currently he is not utilizing any topical steroid creams although this could be of benefit as well. No fevers, chills, nausea, or vomiting noted at this time. 09/05/17 on evaluation today patient appears to be doing better in regard to his left lateral lower extremity ulcer. He has been tolerating the dressing changes without complication.  He is using Santyl with good effect. Overall I'm very pleased with how things are standing at this point. Patient likewise is happy that this is doing better. 09/19/17 on evaluation today patient actually appears to be doing rather well in regard to his left lateral lower extremity ulcer. 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 He's not having any significant pain. 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 He has been tolerating the Santyl without complication and in general I feel like he's making good  progress. 10/17/17 on evaluation today patient appears to be doing very well in regard to his left lateral lower surety ulcer. He has been tolerating the dressing changes without complication. There does not appear to be any evidence of infection he's alternating the Santyl and the triple antibiotic ointment every other day this seems to be doing well for him. 11/03/17 on evaluation today patient appears to be doing very well in regard to his left lateral lower extremity ulcer. He is been tolerating the dressing changes without complication which is good news. Fortunately there does not appear to be any evidence of infection which is also great news. Overall is doing excellent they are starting to taper down on the prednisone is down to 40 mg at this point it also started topical clobetasol for him. 11/17/17 on evaluation today patient appears to be doing well in regard to his left lateral lower surety ulcer. He's been tolerating the dressing changes without complication. He does note that he is having no pain, no excessive drainage or discharge, and overall he feels like things are going about how he would expect and hope they would. Overall he seems to have no evidence of infection at this time in my opinion which is  good news. 12/04/17-He is seen in follow-up evaluation for right lateral lower extremity ulcer. He has been applying topical steroid cream. Today's measurement show slight increase in size. Over the next 2 [name] we will transition to every other day Santyl and steroid cream. He has been encouraged to monitor for changes and notify clinic with any concerns 12/15/17 on evaluation today patient's left lateral motion the ulcer and fortunately is doing worse again at this point. This just since last week to this week has close to doubled in size according to the patient. I did not seeing last week's I do not have a visual to compare this to in our system was also down so we do not have all the charts and at this point. Nonetheless it does have me somewhat concerned in regard to the fact that again he was worried enough about it he has contact the dermatology that placed them back on the full strength, 50 mg a day of the prednisone that he was taken previous. He continues to alternate using clobetasol along with Santyl at this point. He is obviously somewhat frustrated. 12/22/17 on evaluation today patient appears to be doing a little worse compared to last evaluation. Unfortunately the wound is a little deeper and slightly larger than the last week's evaluation. With that being said he has made some progress in regard to the irritation surrounding at this time unfortunately despite that progress that's been made he still has a significant issue going on here. 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 For that reason I discussed with him today the possibility of perform a wound culture to ensure there's nothing overtly infected. 01/06/18 on evaluation today patient's wound is larger and deeper than previously evaluated. With that being said it did appear that  his wound was infected after my last evaluation with him. Subsequently I did end up prescribing a prescription for Bactrim DS which she has been taking and having no complication with. Fortunately there does not appear to be any evidence of Karge, Rochell ([id number]) infection at this point in time as far as anything spreading, no want to  touch, and overall I feel like things are showing signs of improvement. 01/13/18 on evaluation today patient appears to be even a little larger and deeper than last time. There still muscle exposed in the base of the wound. Nonetheless he does appear to be less erythematous I do believe inflammation is calming down also believe the infection looks like it's probably resolved at this time based on what I'm seeing. No fevers, chills, nausea, or vomiting noted at this time. 01/30/18 on evaluation today patient actually appears to visually look better for the most part. Unfortunately those visually this looks better he does seem to potentially have what may be an abscess in the muscle that has been noted in the central portion of the wound. This is the first time that I have noted what appears to be fluctuance in the central portion of the muscle. With that being said I'm somewhat more concerned about the fact that this might indicate an abscess formation at this location. I do believe that an ultrasound would be appropriate. This is likely something we need to try to do as soon as possible. He has been switch to mupirocin ointment and he is no longer using the steroid ointment as prescribed by dermatology he sees them again next week he's been decreased from 60 to 40 mg of prednisone. 03/09/18 on evaluation today patient actually appears to be doing a little better compared to last time I saw him. There's not as much erythema surrounding the wound itself. He I did review his most recent infectious disease note which was dated 02/24/18. He saw Dr. Michel Bickers in  [place]. 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 Patient is now off of antibiotics and they are continually observing this. There seems to be no change in the past two [name] in my pinion based on what the patient says and what I see today compared to what Dr. Megan Salon likely saw two [name] ago. No fevers, chills, nausea, or vomiting noted at this time. 03/23/18 on evaluation today patient's wound actually appears to be showing signs of improvement which is good news. He is currently still on the Dapsone. He is also working on tapering the prednisone to get off of this and Dr. Lottie Rater is working with him in this regard. Nonetheless overall I feel like the wound is doing well it does appear based on the infectious disease note that I reviewed from Dr. 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 I did also review the results of his ultrasound of left lower extremity which revealed there was a dentist tissue in the base of the wound without an abscess noted. 04/06/18 on evaluation today the patient's left lateral lower extremity ulcer actually appears to be doing fairly well which is excellent news. There does not appear to be any evidence of infection at this time which is also great news. Overall he still does have a significantly large ulceration although little by little he seems to be making progress. He is down to 10 mg a day of the prednisone. 04/20/18 on evaluation today patient actually appears to be doing excellent at this time in regard to his left lower extremity ulcer. He's making signs of good progress unfortunately this is taking much longer than we would really like  to see but nonetheless he is making progress. Fortunately there does not appear to be any evidence of infection at this  time. No fevers, chills, nausea, or vomiting noted at this time. The patient has not been using the Santyl due to the cost he hadn't got in this field yet. He's mainly been using the antibiotic ointment topically. Subsequently he also tells me that he really has not been scrubbing in the shower I think this would be helpful again as I told him it doesn't have to be anything too aggressive to even make it believe just enough to keep it free of some of the loose slough and biofilm on the wound surface. 05/11/18 on evaluation today patient's wound appears to be making slow but sure progress in regard to the left lateral lower extremity ulcer. He is been tolerating the dressing changes without complication. Fortunately there does not appear to be any evidence of infection at this time. He is still just using triple antibiotic ointment along with clobetasol occasionally over the area. He never got the Santyl and really does not seem to intend to in my pinion. 06/01/18 on evaluation today patient appears to be doing a little better in regard to his left lateral lower extremity ulcer. He states that overall he does not feel like he is doing as well with the Dapsone as he did with the prednisone. Nonetheless he sees his dermatologist later today and is gonna talk to them about the possibility of going back on the prednisone. Overall again I believe that the wound would be better if you would utilize Santyl but he really does not seem to be interested in going back to the [place] at this point. He has been using triple antibiotic ointment. 06/15/18 on evaluation today patient's wound actually appears to be doing about the same at this point. Fortunately there is no signs of infection at this time. He has made slight improvements although he continues to not really want to clean the wound bed at this point. He states that he just doesn't mess with it he doesn't want to cause any problems with everything else  he has going on. He has been on medication, antibiotics as prescribed by his dermatologist, for a staff infection of his lower extremities which is really drying out now and looking much better he tells me. Fortunately there is no sign of overall infection. [name], [name] ([id number]) Electronic Signature(s) Signed: 06/19/2018 9:05:33 PM By: [name] Entered By: [name] Keeler on 06/15/2018 12:34:19 [name], [name] ([id number]) -------------------------------------------------------------------------------- Physical Exam Details Patient Name: [name] Date of Service: 06/15/2018 8:00 AM Medical Record Number: [id number] Patient Account Number: [id number] Date of Birth/Sex: [birth year] (38 y.o. M) Treating RN: [name] Primary Care Provider: Alma [name] Other Clinician: Referring Provider: Alma [name] Treating Provider/Extender: [name], [place] Rumple [name] in Treatment: 21 Constitutional Well-nourished and well-hydrated in no acute distress. Respiratory normal breathing without difficulty. clear to auscultation bilaterally. Cardiovascular regular rate and rhythm with normal S1, S2. Psychiatric this patient is able to make decisions and demonstrates good insight into disease process. Alert and Oriented x 3. pleasant and cooperative. Notes On evaluation today patient is interested in continuing with how things have progressed up to this point. He really does not want to proceed with any additional debridement due to the nature of this wound to being hired on the. I completely understand and respect that. With that being said I do believe that this is something that would potentially be  of benefit for him to at least clean the area well at home on his own. Electronic Signature(s) Signed: 06/19/2018 9:05:33 PM By: [name] Entered By: [name] Keeler on 06/15/2018 12:48:18 JULIE, PAOLINI  ([id number]) -------------------------------------------------------------------------------- Physician Orders Details Patient Name: [name] Date of Service: 06/15/2018 8:00 AM Medical Record Number: [id number] Patient Account Number: [id number] Date of Birth/Sex: 03/01/1981 (38 y.o. 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 Anesthetic (add to Medication List) Wound #1 Left,Lateral Lower Leg o Topical Lidocaine 4% cream applied to wound bed prior to debridement (In Clinic Only). Primary Wound Dressing Wound #1 Left,Lateral Lower Leg o Other: - Triple antibiotic ointment Secondary Dressing Wound #1 Left,Lateral Lower Leg o Boardered Foam Dressing Dressing Change Frequency Wound #1 Left,Lateral Lower Leg o Change dressing every day. Follow-up Appointments Wound #1 Left,Lateral Lower Leg o Return Appointment in 2 [name]. Edema Control Wound #1 Left,Lateral Lower Leg o Patient to wear own compression stockings o Elevate legs to the level of the heart and pump ankles as often as possible Medications-please add to medication list. Wound #1 Left,Lateral Lower Leg o P.O. Antibiotics - Prescribed by Dermatology for Staph Infection o Other: - Continue steroids [name], [name] ([id number]) Electronic Signature(s) Signed: 06/15/2018 4:45:46 PM By: [name], [name], RN, [name], [name], [name] Signed: 06/19/2018 9:05:33 PM By: [name] Entered By: [name] [name], RN, [name], [name] on 06/15/2018  08:31:46 [name], [name] ([id number]) -------------------------------------------------------------------------------- Problem List Details Patient Name: [name] Date of Service: 06/15/2018 8:00 AM Medical Record Number: [id number] Patient Account Number: [id number] Date of Birth/Sex: 31-Dec-1980 (38 y.o. M) Treating RN: [name] Primary Care Provider: Alma [name] Other Clinician: Referring Provider: Alma [name] Treating Provider/Extender: [name], Akiva Josey [name] in Treatment: 41 Active Problems ICD-10 Evaluated Encounter Code Description Active Date Today Diagnosis L97.222 Non-pressure chronic ulcer of left calf with fat layer exposed 12/04/2016 No Yes L88 Pyoderma gangrenosum 03/26/2017 No Yes I87.2 Venous insufficiency (chronic) (peripheral) 12/04/2016 No Yes Inactive Problems ICD-10 Code Description Active Date Inactive Date L97.213 Non-pressure chronic ulcer of right calf with necrosis of muscle 04/02/2017 04/02/2017 Resolved Problems Electronic Signature(s) Signed: 06/19/2018 9:05:33 PM By: [name] Entered By: [name] Keeler on 06/15/2018 08:18:57 [name], [name] ([id number]) -------------------------------------------------------------------------------- Progress Note Details Patient Name: [name] Date of Service: 06/15/2018 8:00 AM Medical Record Number: [id number] Patient Account Number: [id number] Date of Birth/Sex: 06/13/1980 (38 y.o. M) Treating RN: [name] Primary Care Provider: Alma [name] Other Clinician: Referring Provider: Alma [name] Treating Provider/Extender: [name], Ruthvik Barnaby [name] in Treatment: 83 Subjective Chief Complaint Information obtained from Patient He is here in follow up evaluation for LLE pyoderma ulcer History of Present Illness (HPI) 12/04/16; 38 year old man who comes into the clinic today for review of a wound on the posterior left calf. He tells me that is been there for about a year. He is not a diabetic  he does smoke half a pack per day. He was seen in the ER on 11/20/16 felt to have cellulitis around the wound and was given clindamycin. An x-ray did not show osteomyelitis. The patient initially tells me  that he has a milk allergy that sets off a pruritic itching rash on his lower legs which she scratches incessantly and he thinks that's what may have set up the wound. He has been using various topical antibiotics and ointments without any effect. He works in a [employer] and is on his feet all day. He does not have a prior history of wounds however he does have the rash on both lower legs the right arm and the ventral aspect of his left arm. These are excoriations and clearly have had scratching however there are of macular looking areas on both legs including a substantial larger area on the right leg. This does not have an underlying open area. There is no blistering. The patient tells me that 2 years ago in [place] in response to the rash on his legs he saw a dermatologist who told him he had a condition which may be pyoderma gangrenosum although I may be putting words into his mouth. He seemed to recognize this. 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 He is not in any treatment for this. He's had no recent travel 12/11/16; the patient arrives today with his wound and roughly the same condition we've been using silver alginate this is a deep punched out wound with some surrounding erythema but no tenderness. Biopsy I did did not show confirmed pyoderma gangrenosum suggested nonspecific inflammation and vasculitis but does not provide an actual description of what was seen by the pathologist. I'm really not able to understand this We have also received information from the patient's dermatologist in [place] notes from April 2016. This was a doctor Agarwal- antal. 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 He was prescribed topical steroid  high potency under occlusion which helped but at this point the patient did not have a deep punched out wound. 12/18/16; the patient's wound is larger in terms of surface area however this surface looks better and there is less depth. The surrounding erythema also is better. The patient states that the wrap we put on came off 2 days ago when he has been using his compression stockings. He we are in the process of getting a dermatology consult. 12/26/16 on evaluation today patient's left lower extremity wound shows evidence of infection with surrounding erythema noted. He has been tolerating the dressing changes but states that he has noted more discomfort. There is a larger area of erythema surrounding the wound. No fevers, chills, nausea, or vomiting noted at this time. With that being said the wound still does have slough covering the surface. He is not allergic to any medication that he is aware of at this point. In regard to his right lower extremity he had several regions that are erythematous and pruritic he wonders if there's anything we can do to help that. 01/02/17 I reviewed patient's wound culture which was obtained his visit last week. He was placed on doxycycline at that point. Unfortunately that does not appear to be an antibiotic that would likely help with the situation however the pseudomonas noted on culture is sensitive to Cipro. Also unfortunately patient's wound seems to have a large compared to last week's evaluation. Not severely so but there are definitely increased measurements in general. He is continuing to have discomfort as well he writes this to be a seven out of 10. In fact he would prefer me not to perform any debridement today due to the fact that he is  having discomfort and considering he has an active infection on the little reluctant to do so anyway. No fevers, [name], [name] ([id number]) chills, nausea, or vomiting noted at this time. 01/08/17; patient seems  dermatology on September 5. I suspect dermatology will want the slides from the biopsy I did sent to their pathologist. I'm not sure if there is a way we can expedite that. In any case the culture I did before I left on vacation 3 [name] ago showed Pseudomonas he was given 10 days of Cipro and per her description of her intake nurses is actually somewhat better this week although the wound is quite a bit bigger than I remember the last time I saw this. He still has 3 more days of Cipro 01/21/17; dermatology appointment tomorrow. He has completed the ciprofloxacin for Pseudomonas. Surface of the wound looks better however he is had some deterioration in the lesions on his right leg. Meantime the left lateral leg wound we will continue with sample 01/29/17; patient had his dermatology appointment but I can't yet see that note. He is completed his antibiotics. The wound is more superficial but considerably larger in circumferential area than when he came in. This is in his left lateral calf. He also has swollen erythematous areas with superficial wounds on the right leg and small papular areas on both arms. There apparently areas in her his upper thighs and buttocks I did not look at those. Dermatology biopsied the right leg. Hopefully will have their input next week. 02/05/17; patient went back to see his dermatologist who told him that he had a "scratching problem" as well as staph. He is now on a 30 day course of doxycycline and I believe she gave him triamcinolone cream to the right leg areas to help with the itching [not exactly sure but probably triamcinolone]. She apparently looked at the left lateral leg wound although this was not rebiopsied and I think felt to be ultimately part of the same pathogenesis. He is using sample border foam and changing nevus himself. He now has a new open area on the right posterior leg which was his biopsy site I don't have any of the dermatology notes 02/12/17; we  put the patient in compression last week with SANTYL to the wound on the left leg and the biopsy. Edema is much better and the depth of the wound is now at level of skin. Area is still the same Biopsy site on the right lateral leg we've also been using santyl with a border foam dressing and he is changing this himself. 02/19/17; Using silver alginate started last week to both the substantial left leg wound and the biopsy site on the right wound. He is tolerating compression well. Has a an appointment with his primary M.D. tomorrow wondering about diuretics although I'm wondering if the edema problem is actually lymphedema 02/26/17; the patient has been to see his primary doctor Dr. Jerrel Ivory at [facility] our primary care. She started him on Lasix 20 mg and this seems to have helped with the edema. However we are not making substantial change with the left lateral calf wound and inflammation. The biopsy site on the right leg also looks stable but not really all that different. 03/12/17; the patient has been to see vein and vascular Dr. Lucky Cowboy. He has had venous reflux studies I have not reviewed these. I did get a call from his dermatology office. They felt that he might have pathergy based on their biopsy on his  right leg which led them to look at the slides of the biopsy I did on the left leg and they wonder whether this represents pyoderma gangrenosum which was the original supposition in a man with ulcerative colitis albeit inactive for many years. They therefore recommended clobetasol and tetracycline i.e. 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 03/26/17; apparently the patient just had reflux studies not an appointment with Dr. dew. She arrives in clinic today having applied clobetasol for 2-3 [name]. He notes over the last 2-3 days excessive drainage having to change the dressing 3-4 times a day and also expanding erythema. He states the expanding erythema seems to come and go and  was last this red was earlier in the month.he is on doxycycline 150 mg twice a day as an anti-inflammatory systemic therapy for possible pyoderma gangrenosum along with the topical clobetasol 04/02/17; the patient was seen last week by Dr. Lillia Carmel at [facility] dermatology locally who kindly saw him at my request. 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 My concern was the degree of erythema medially extending from his left leg wound which was either inflammation from pyoderma or cellulitis. I put him on Augmentin however culture of the wound showed Pseudomonas which is quinolone sensitive. I really don't believe he has cellulitis however in view of everything I will continue and give him a course of Cipro. He is also on doxycycline as an immune modulator for the pyoderma. In addition to his original wound on the left lateral leg with surrounding erythema he has a wound on the right posterior calf which was an original biopsy site done by dermatology. 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 Saw Dr. Lillia Carmel yesterday. He has been using topical antibiotics to both wound areas his original wound on the left and the biopsies/pathergy area on the right. There is definitely some improvement in the inflammation around the wound on the right although the patient states he has increasing sensitivity of the wounds. He is on prednisone 60 and doxycycline 1 as prescribed by Dr. Lillia Carmel. He is covering the topical antibiotic with gauze and putting this in his own compression stocks and changing this daily. He states that Dr. Lottie Rater did a culture of the left leg wound yesterday 05/07/17; pyoderma gangrenosum. The patient saw Dr. Lillia Carmel yesterday and has a follow-up with her in one month. He is still using topical antibiotics to both wounds although he can't recall exactly what type.  He is still on prednisone 60 mg. Dr. Lillia Carmel stated that the doxycycline could stop if we were in agreement. 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 The right medial leg was induced by [name] ([id number]) biopsy/pathergy. The area on the right is essentially healed. 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 The right medial leg has closed and remains closed. He is still on prednisone 60. He tells me he missed his last dermatology appointment with Dr. Lillia Carmel but will make another appointment. He reports that her blood sugar at a recent screen in [place] was high 200's. He was 180 today. 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 I think he is going to require still much longer prednisone perhaps another 3 months before attempting to taper. In the meantime his  wound is a lot better. Smaller. He is cleaning this off daily and applying topical antibiotics. When he was last in the clinic I thought about changing to [facility] and actually put in a couple of calls to dermatology although probably not during their business hours. 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 He continues on oral prednisone. He has been using triple antibiotic ointment. There is surface debris and we will transition to [facility] and have him return in 2 [name]. He has lost 30 pounds since his last appointment with lifestyle modification. He may benefit from topical steroid cream for treatment this can be considered at a later date. 08/22/17 on evaluation today patient appears to actually be doing rather well in regard to his left lateral lower extremity ulcer. He has actually been  managed by Dr. Dellia Nims most recently. Patient is currently on oral steroids at this time. This seems to have been of benefit for him. Nonetheless his last visit was actually with Leah on 08/06/17. Currently he is not utilizing any topical steroid creams although this could be of benefit as well. No fevers, chills, nausea, or vomiting noted at this time. 09/05/17 on evaluation today patient appears to be doing better in regard to his left lateral lower extremity ulcer. He has been tolerating the dressing changes without complication. He is using Santyl with good effect. Overall I'm very pleased with how things are standing at this point. Patient likewise is happy that this is doing better. 09/19/17 on evaluation today patient actually appears to be doing rather well in regard to his left lateral lower extremity ulcer. 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 He's not having any significant pain. 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 He has been tolerating the Santyl without complication and in general I feel like he's making good progress. 10/17/17 on evaluation today patient appears to be doing very well in regard to his left lateral lower surety ulcer. He has been tolerating the dressing changes without complication. There does not appear to be any evidence of infection he's alternating the Santyl and the triple antibiotic ointment every other day this seems to be doing well for him. 11/03/17 on evaluation today patient appears to be doing very well in regard to his left lateral lower extremity ulcer. He is been tolerating the dressing changes without complication which is good news. Fortunately there does not appear to be any evidence of infection which is also great news. Overall is doing excellent they are  starting to taper down on the prednisone is down to 40 mg at this point it also started topical clobetasol for him. 11/17/17 on evaluation today patient appears to be doing well in regard to his left lateral lower surety ulcer. He's been tolerating the dressing changes without complication. He does note that he is having no pain, no excessive drainage or discharge, and overall he feels like things are going about how he would expect and hope they would. Overall he seems to have no evidence of infection at this time in my opinion which is good news. 12/04/17-He is seen in follow-up evaluation for right lateral lower extremity ulcer. He has been applying  topical steroid cream. Today's measurement show slight increase in size. Over the next 2 [name] we will transition to every other day Santyl and steroid cream. He has been encouraged to monitor for changes and notify clinic with any concerns 12/15/17 on evaluation today patient's left lateral motion the ulcer and fortunately is doing worse again at this point. This just since last week to this week has close to doubled in size according to the patient. I did not seeing last week's I do not have a visual to compare this to in our system was also down so we do not have all the charts and at this point. Nonetheless it does have me somewhat concerned in regard to the fact that again he was worried enough about it he has contact the dermatology that placed them back on the full strength, 50 mg a day of the prednisone that he was taken previous. He continues to alternate using clobetasol along with Santyl at this point. He is obviously somewhat frustrated. 12/22/17 on evaluation today patient appears to be doing a little worse compared to last evaluation. Unfortunately the wound is a little deeper and slightly larger than the last week's evaluation. With that being said he has made some progress in regard to the irritation surrounding at this time unfortunately  despite that progress that's been made he still has a significant issue going on here. I'm not certain that he is having really any true infection at this time although with the Pyoderma gangrenosum it can [name], [name] ([id number]) sometimes be difficult to differentiate infection versus just inflammation. For that reason I discussed with him today the possibility of perform a wound culture to ensure there's nothing overtly infected. 01/06/18 on evaluation today patient's wound is larger and deeper than previously evaluated. With that being said it did appear that his wound was infected after my last evaluation with him. Subsequently I did end up prescribing a prescription for Bactrim DS which she has been taking and having no complication with. Fortunately there does not appear to be any evidence of infection at this point in time as far as anything spreading, no want to touch, and overall I feel like things are showing signs of improvement. 01/13/18 on evaluation today patient appears to be even a little larger and deeper than last time. There still muscle exposed in the base of the wound. Nonetheless he does appear to be less erythematous I do believe inflammation is calming down also believe the infection looks like it's probably resolved at this time based on what I'm seeing. No fevers, chills, nausea, or vomiting noted at this time. 01/30/18 on evaluation today patient actually appears to visually look better for the most part. Unfortunately those visually this looks better he does seem to potentially have what may be an abscess in the muscle that has been noted in the central portion of the wound. This is the first time that I have noted what appears to be fluctuance in the central portion of the muscle. With that being said I'm somewhat more concerned about the fact that this might indicate an abscess formation at this location. I do believe that an ultrasound would be appropriate. This is  likely something we need to try to do as soon as possible. He has been switch to mupirocin ointment and he is no longer using the steroid ointment as prescribed by dermatology he sees them again next week he's been decreased from 60 to 40 mg of prednisone. 03/09/18 on  evaluation today patient actually appears to be doing a little better compared to last time I saw him. There's not as much erythema surrounding the wound itself. He I did review his most recent infectious disease note which was dated 02/24/18. He saw Dr. Michel Bickers in [place]. 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 Patient is now off of antibiotics and they are continually observing this. There seems to be no change in the past two [name] in my pinion based on what the patient says and what I see today compared to what Dr. Megan Salon likely saw two [name] ago. No fevers, chills, nausea, or vomiting noted at this time. 03/23/18 on evaluation today patient's wound actually appears to be showing signs of improvement which is good news. He is currently still on the Dapsone. He is also working on tapering the prednisone to get off of this and Dr. Lottie Rater is working with him in this regard. Nonetheless overall I feel like the wound is doing well it does appear based on the infectious disease note that I reviewed from Dr. 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 I did also review the results of his ultrasound of left lower extremity which revealed there was a dentist tissue in the base of the wound without an abscess noted. 04/06/18 on evaluation today the patient's left lateral lower extremity ulcer actually appears to be doing fairly well which is excellent news. There does not appear to be any evidence of infection at this time which  is also great news. Overall he still does have a significantly large ulceration although little by little he seems to be making progress. He is down to 10 mg a day of the prednisone. 04/20/18 on evaluation today patient actually appears to be doing excellent at this time in regard to his left lower extremity ulcer. He's making signs of good progress unfortunately this is taking much longer than we would really like to see but nonetheless he is making progress. Fortunately there does not appear to be any evidence of infection at this time. No fevers, chills, nausea, or vomiting noted at this time. The patient has not been using the Santyl due to the cost he hadn't got in this field yet. He's mainly been using the antibiotic ointment topically. Subsequently he also tells me that he really has not been scrubbing in the shower I think this would be helpful again as I told him it doesn't have to be anything too aggressive to even make it believe just enough to keep it free of some of the loose slough and biofilm on the wound surface. 05/11/18 on evaluation today patient's wound appears to be making slow but sure progress in regard to the left lateral lower extremity ulcer. He is been tolerating the dressing changes without complication. Fortunately there does not appear to be any evidence of infection at this time. He is still just using triple antibiotic ointment along with clobetasol occasionally over the area. He never got the Santyl and really does not seem to intend to in my pinion. 06/01/18 on evaluation today patient appears to be doing a little better in regard to his left lateral lower extremity ulcer. He states that overall he does not feel like he is doing as well with the  Dapsone as he did with the prednisone. Nonetheless he sees his dermatologist later today and is gonna talk to them about the possibility of going back on the prednisone. Overall again I believe that the wound would be better  if you would utilize Santyl but he really does not seem to be interested in going back to the [place] at this point. He has been using triple antibiotic ointment. [name], [name] ([id number]) 06/15/18 on evaluation today patient's wound actually appears to be doing about the same at this point. Fortunately there is no signs of infection at this time. He has made slight improvements although he continues to not really want to clean the wound bed at this point. He states that he just doesn't mess with it he doesn't want to cause any problems with everything else he has going on. He has been on medication, antibiotics as prescribed by his dermatologist, for a staff infection of his lower extremities which is really drying out now and looking much better he tells me. Fortunately there is no sign of overall infection. Patient History Information obtained from Patient. Family History Diabetes - Mother,Maternal Grandparents, Heart Disease - Mother,Maternal Grandparents, Hereditary Spherocytosis - Siblings, Hypertension - Maternal Grandparents, Stroke - Siblings, No family history of Cancer, Kidney Disease, Lung Disease, Seizures, Thyroid Problems, Tuberculosis. Social History Current some day smoker, Marital Status - Married, Alcohol Use - Rarely, Drug Use - No History, Caffeine Use - Moderate. Medical History Eyes Denies history of Cataracts, Glaucoma, Optic Neuritis Ear/Nose/Mouth/Throat Denies history of Chronic sinus problems/congestion, Middle ear problems Hematologic/Lymphatic Denies history of Anemia, Hemophilia, Human Immunodeficiency Virus, Lymphedema, Sickle Cell Disease Respiratory Patient has history of Sleep Apnea - cpap Denies history of Aspiration, Asthma, Chronic Obstructive Pulmonary Disease (COPD), Pneumothorax Cardiovascular Patient has history of Hypertension Denies history of Angina, Arrhythmia, Congestive Heart Failure, Coronary Artery Disease, Deep Vein  Thrombosis, Hypotension, Myocardial Infarction, Peripheral Arterial Disease, Peripheral Venous Disease, Phlebitis, Vasculitis Gastrointestinal Patient has history of Colitis Endocrine Denies history of Type I Diabetes, Type II Diabetes Genitourinary Denies history of End Stage Renal Disease Immunological Denies history of Lupus Erythematosus, Raynaud s, Scleroderma Integumentary (Skin) Denies history of History of Burn, History of pressure wounds Musculoskeletal Denies history of Gout, Rheumatoid Arthritis, Osteoarthritis, Osteomyelitis Neurologic Denies history of Dementia, Neuropathy, Quadriplegia, Paraplegia, Seizure Disorder Oncologic Denies history of Received Chemotherapy, Received Radiation Psychiatric Denies history of Anorexia/bulimia, Confinement Anxiety Review of Systems (ROS) Constitutional Symptoms (General Health) Denies complaints or symptoms of Fever, Chills. Respiratory The patient has no complaints or symptoms. Cardiovascular The patient has no complaints or symptoms. Psychiatric The patient has no complaints or symptoms. [name], BRANIFF ([id number]) Objective Constitutional Well-nourished and well-hydrated in no acute distress. Vitals Time Taken: 8:08 AM, Height: 71 in, Weight: 338 lbs, BMI: 47.1, Temperature: 98.5 F, Pulse: 105 bpm, Respiratory Rate: 18 breaths/min, Blood Pressure: 139/67 mmHg. Respiratory normal breathing without difficulty. clear to auscultation bilaterally. Cardiovascular regular rate and rhythm with normal S1, S2. Psychiatric this patient is able to make decisions and demonstrates good insight into disease process. Alert and Oriented x 3. pleasant and cooperative. General Notes: On evaluation today patient is interested in continuing with how things have progressed up to this point. He really does not want to proceed with any additional debridement due to the nature of this wound to being hired on the. I completely understand and  respect that. With that being said I do believe that this is something that would potentially be of benefit for him to at  least clean the area well at home on his own. Integumentary (Hair, Skin) Wound #1 status is Open. Original cause of wound was Gradually Appeared. The wound is located on the Left,Lateral Lower Leg. The wound measures 2.5cm length x 3.7cm width x 0.3cm depth; 7.265cm^2 area and 2.179cm^3 volume. There is muscle and Fat Layer (Subcutaneous Tissue) Exposed exposed. There is no tunneling or undermining noted. There is a large amount of serous drainage noted. The wound margin is distinct with the outline attached to the wound base. There is no granulation within the wound bed. There is a large (67-100%) amount of necrotic tissue within the wound bed including Adherent Slough. The periwound skin appearance exhibited: Induration, Scarring, Hemosiderin Staining. The periwound skin appearance did not exhibit: Callus, Crepitus, Excoriation, Rash, Dry/Scaly, Maceration, Atrophie Blanche, Cyanosis, Ecchymosis, Mottled, Pallor, Rubor, Erythema. Periwound temperature was noted as No Abnormality. The periwound has tenderness on palpation. 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 Anesthetic (add to Medication List): Wound #1 Left,Lateral Lower Leg: Topical Lidocaine 4% cream applied to wound bed prior to debridement (In Clinic Only). Primary Wound Dressing: Wound #1 Left,Lateral Lower Leg: Other: - Triple antibiotic ointment Secondary Dressing: Wound #1 Left,Lateral Lower Leg: Boardered Foam Dressing Dressing Change Frequency: Wound #1 Left,Lateral Lower Leg: Change dressing every day. Follow-up Appointments: Wound  #1 Left,Lateral Lower Leg: Return Appointment in 2 [name]. Edema Control: Wound #1 Left,Lateral Lower Leg: Patient to wear own compression stockings Elevate legs to the level of the heart and pump ankles as often as possible Medications-please add to medication list.: Wound #1 Left,Lateral Lower Leg: P.O. Antibiotics - Prescribed by Dermatology for Staph Infection Other: - Continue steroids We will continue with the above wound care measures for the next two [name] I'll see him for reevaluation at that point. Please see above for specific wound care orders. We will see patient for re-evaluation in 2 week(s) here in the clinic. If anything worsens or changes patient will contact our office for additional recommendations. I did mention again that if you would like to use the central I think this would be better than the antibiotic ointment although I don't think he's gonna utilize that. Electronic Signature(s) Signed: 06/19/2018 9:05:33 PM By: [name] Entered By: [name] Keeler on 06/15/2018 12:48:45 [name], [name] ([id number]) -------------------------------------------------------------------------------- ROS/PFSH Details Patient Name: [name] Date of Service: 06/15/2018 8:00 AM Medical Record Number: [id number] Patient Account Number: [id number] Date of Birth/Sex: 09/03/1980 (38 y.o. M) Treating RN: [name] Primary Care Provider: Alma [name] Other Clinician: Referring Provider: Alma [name] Treating Provider/Extender: [name], Kaylor Simenson [name] in Treatment: 65 Information Obtained From Patient Wound History Do you currently have one or more open woundso Yes How many open wounds do you currently haveo 1 Approximately how long have you had your woundso 1 year How have you been treating your wound(s) until nowo neosporin Has your wound(s) ever healed and then re-openedo No Have you had any lab work done in the past montho No Have you tested positive for an  antibiotic resistant organism (MRSA, VRE)o No Have you tested positive for osteomyelitis (bone infection)o No Have you had any tests for circulation on your legso No Have you had other problems associated with your woundso Infection, Swelling Constitutional Symptoms (General Health) Complaints and Symptoms: Negative  for: Fever; Chills Eyes Medical History: Negative for: Cataracts; Glaucoma; Optic Neuritis Ear/Nose/Mouth/Throat Medical History: Negative for: Chronic sinus problems/congestion; Middle ear problems Hematologic/Lymphatic Medical History: Negative for: Anemia; Hemophilia; Human Immunodeficiency Virus; Lymphedema; Sickle Cell Disease Respiratory Complaints and Symptoms: No Complaints or Symptoms Medical History: Positive for: Sleep Apnea - cpap Negative for: Aspiration; Asthma; Chronic Obstructive Pulmonary Disease (COPD); Pneumothorax Cardiovascular Complaints and Symptoms: No Complaints or Symptoms Medical HistoryTESHAUN, OLARTE ([id number]) Positive for: Hypertension Negative for: Angina; Arrhythmia; Congestive Heart Failure; Coronary Artery Disease; Deep Vein Thrombosis; Hypotension; Myocardial Infarction; Peripheral Arterial Disease; Peripheral Venous Disease; Phlebitis; Vasculitis Gastrointestinal Medical History: Positive for: Colitis Endocrine Medical History: Negative for: Type I Diabetes; Type II Diabetes Genitourinary Medical History: Negative for: End Stage Renal Disease Immunological Medical History: Negative for: Lupus Erythematosus; Raynaudos; Scleroderma Integumentary (Skin) Medical History: Negative for: History of Burn; History of pressure wounds Musculoskeletal Medical History: Negative for: Gout; Rheumatoid Arthritis; Osteoarthritis; Osteomyelitis Neurologic Medical History: Negative for: Dementia; Neuropathy; Quadriplegia; Paraplegia; Seizure Disorder Oncologic Medical History: Negative for: Received Chemotherapy; Received  Radiation Psychiatric Complaints and Symptoms: No Complaints or Symptoms Medical History: Negative for: Anorexia/bulimia; Confinement Anxiety Immunizations Pneumococcal Vaccine: Received Pneumococcal Vaccination: No Implantable Devices Family and Social History ADRIENE, PADULA ([id number]) Cancer: No; Diabetes: Yes - Mother,Maternal Grandparents; Heart Disease: Yes - Mother,Maternal Grandparents; Hereditary Spherocytosis: Yes - Siblings; Hypertension: Yes - Maternal Grandparents; Kidney Disease: No; Lung Disease: No; Seizures: No; Stroke: Yes - Siblings; Thyroid Problems: No; Tuberculosis: No; Current some day smoker; Marital Status - Married; Alcohol Use: Rarely; Drug Use: No History; Caffeine Use: Moderate; Financial Concerns: No; Food, Clothing or Shelter Needs: No; Support System Lacking: No; Transportation Concerns: No; Advanced Directives: No; Patient does not want information on Advanced Directives; Living Will: No Physician Affirmation I have reviewed and agree with the above information. Electronic Signature(s) Signed: 06/15/2018 4:45:46 PM By: [name], [name], RN, [name], [name], [name] Signed: 06/19/2018 9:05:33 PM By: [name] Entered By: [name] Keeler on 06/15/2018 12:34:44 CORRELL, DENBOW ([id number]) -------------------------------------------------------------------------------- SuperBill Details Patient Name: [name] Date of Service: 06/15/2018 Medical Record Number: [id number] Patient Account Number: [id number] Date of Birth/Sex: 27-Oct-1980 (38 y.o. M) Treating RN: [name] Primary Care Provider: Alma [name] Other Clinician: Referring Provider: Alma [name] Treating Provider/Extender: [name], Leondra Cullin [name] in Treatment: 77 Diagnosis Coding ICD-10 Codes Code Description [phone] Non-pressure chronic ulcer of left calf with fat layer exposed L88 Pyoderma gangrenosum I87.2 Venous insufficiency (chronic) (peripheral) Facility Procedures CPT4 Code:  [id number] Description: 19509 - WOUND CARE VISIT-LEV 2 EST PT Modifier: Quantity: 1 Physician Procedures CPT4 Code: [id number] Description: 58099 - WC PHYS LEVEL 4 - EST PT ICD-10 Diagnosis Description L97.222 Non-pressure chronic ulcer of left calf with fat layer expo L88 Pyoderma gangrenosum I87.2 Venous insufficiency (chronic) (peripheral) Modifier: sed Quantity: 1 Electronic Signature(s) Signed: 06/19/2018 9:05:33 PM By: [name] Entered By: [name] Keeler on 06/15/2018 12:48:54

## 2018-06-21 NOTE — Progress Notes (Signed)
EVELYN, MOCH ([id number]) Visit Report for 06/15/2018 Arrival Information Details Patient Name: [name], [name] Date of Service: 06/15/2018 8:00 AM Medical Record Number: [id number] Patient Account Number: [id number] Date of Birth/Sex: 02-06-1981 (37 y.o. M) Treating RN: [name] Primary Care Darrielle Pflieger: [name] Other Clinician: Referring Gurneet Matarese: [name] Treating Danette Weinfeld/Extender: Melburn Hake, [name]: 5 Visit Information History Since Last Visit Added or deleted any medications: Yes Patient Arrived: Ambulatory Any new allergies or adverse reactions: No Arrival Time: 08:06 Had a fall or experienced change in No Accompanied By: self activities of daily living that may affect Transfer Assistance: None risk of falls: Patient Identification Verified: Yes Signs or symptoms of abuse/neglect since last visito No Secondary Verification Process Completed: Yes Hospitalized since last visit: No Patient Requires Transmission-Based No Implantable device outside of the clinic excluding No Precautions: cellular tissue based products placed in the center Patient Has Alerts: No since last visit: Has Dressing in Place as Prescribed: Yes Pain Present Now: No Electronic Signature(s) Signed: 06/15/2018 2:36:35 PM By: Lorine Bears RCP, RRT, CHT Entered By: Lorine Bears on 06/15/2018 08:08:06 [name] ([id number]) -------------------------------------------------------------------------------- Clinic Level of Care Assessment Details Patient Name: [name] Date of Service: 06/15/2018 8:00 AM Medical Record Number: [id number] Patient Account Number: [id number] Date of Birth/Sex: [birth year] (37 y.o. M) Treating RN: [name] Primary Care Leane Loring: [name] Other Clinician: Referring [place]: [name] Treating Aidel Davisson/Extender: Melburn Hake, [name]: 50 Clinic Level of Care Assessment Items TOOL 4  Quantity Score []  - Use when only an EandM is performed on FOLLOW-UP visit 0 ASSESSMENTS - Nursing Assessment / Reassessment []  - Reassessment of Co-morbidities (includes updates in patient status) 0 X- 1 5 Reassessment of Adherence to Treatment Plan ASSESSMENTS - Wound and Skin Assessment / Reassessment X - Simple Wound Assessment / Reassessment - one wound 1 5 []  - 0 Complex Wound Assessment / Reassessment - multiple wounds []  - 0 Dermatologic / Skin Assessment (not related to wound area) ASSESSMENTS - Focused Assessment []  - Circumferential Edema Measurements - multi extremities 0 []  - 0 Nutritional Assessment / Counseling / Intervention []  - 0 Lower Extremity Assessment (monofilament, tuning fork, pulses) []  - 0 Peripheral Arterial Disease Assessment (using hand held doppler) ASSESSMENTS - Ostomy and/or Continence Assessment and Care []  - Incontinence Assessment and Management 0 []  - 0 Ostomy Care Assessment and Management (repouching, etc.) PROCESS - Coordination of Care X - Simple Patient / Family Education for ongoing care 1 15 []  - 0 Complex (extensive) Patient / Family Education for ongoing care []  - 0 Staff obtains Programmer, systems, Records, Test Results / Process Orders []  - 0 Staff telephones HHA, Nursing Homes / Clarify orders / etc []  - 0 Routine Transfer to another Facility (non-emergent condition) []  - 0 Routine Hospital Admission (non-emergent condition) []  - 0 New Admissions / Biomedical engineer / Ordering NPWT, Apligraf, etc. []  - 0 Emergency Hospital Admission (emergent condition) X- 1 10 Simple Discharge Coordination [name], [name] ([id number]) []  - 0 Complex (extensive) Discharge Coordination PROCESS - Special Needs []  - Pediatric / Minor Patient Management 0 []  - 0 Isolation Patient Management []  - 0 Hearing / Language / Visual special needs []  - 0 Assessment of Community assistance (transportation, D/C planning, etc.) []  - 0 Additional  assistance / Altered mentation []  - 0 Support Surface(s) Assessment (bed, cushion, seat, etc.) INTERVENTIONS - Wound Cleansing / Measurement X - Simple Wound Cleansing - one wound 1 5 []  - 0 Complex Wound Cleansing -  multiple wounds X- 1 5 Wound Imaging (photographs - any number of wounds) []  - 0 Wound Tracing (instead of photographs) X- 1 5 Simple Wound Measurement - one wound []  - 0 Complex Wound Measurement - multiple wounds INTERVENTIONS - Wound Dressings []  - Small Wound Dressing one or multiple wounds 0 X- 1 15 Medium Wound Dressing one or multiple wounds []  - 0 Large Wound Dressing one or multiple wounds []  - 0 Application of Medications - topical []  - 0 Application of Medications - injection INTERVENTIONS - Miscellaneous []  - External ear exam 0 []  - 0 Specimen Collection (cultures, biopsies, blood, body fluids, etc.) []  - 0 Specimen(s) / Culture(s) sent or taken to Lab for analysis []  - 0 Patient Transfer (multiple staff / Civil Service fast streamer / Similar devices) []  - 0 Simple Staple / Suture removal (25 or less) []  - 0 Complex Staple / Suture removal (26 or more) []  - 0 Hypo / Hyperglycemic Management (close monitor of Blood Glucose) []  - 0 Ankle / Brachial Index (ABI) - do not check if billed separately X- 1 5 Vital Signs [name], [name] ([id number]) Has the patient been seen at the hospital within the last three years: Yes Total Score: 70 Level Of Care: New/Established - Level 2 Electronic Signature(s) Signed: 06/15/2018 4:45:46 PM By: [name], [name], RN, [name], [name], [name] Entered By: [name], [name], RN, [name], [name] on 06/15/2018 08:32:26 [name] ([id number]) -------------------------------------------------------------------------------- Encounter Discharge Information Details Patient Name: [name] Date of Service: 06/15/2018 8:00 AM Medical Record Number: [id number] Patient Account Number: [id number] Date of Birth/Sex: Sep 26, 1980 (37 y.o. M) Treating RN: [name] Primary Care Tyshon Fanning: [name] Other Clinician: Referring Idrees Quam: [name] Treating [name] [name]/Extender: Melburn Hake, [name]: 28 Encounter Discharge Information Items Discharge Condition: Stable Ambulatory Status: Ambulatory Discharge Destination: Home Transportation: Private Auto Accompanied By: self Schedule Follow-up Appointment: Yes Clinical Summary of Care: Electronic Signature(s) Signed: 06/15/2018 4:45:46 PM By: [name], [name], RN, [name], [name], [name] Entered By: [name], [name], RN, [name], [name] on 06/15/2018 08:37:01 [name] ([id number]) -------------------------------------------------------------------------------- Lower Extremity Assessment Details Patient Name: [name] Date of Service: 06/15/2018 8:00 AM Medical Record Number: [id number] Patient Account Number: [id number] Date of Birth/Sex: April 21, 1981 (37 y.o. M) Treating RN: [name] Primary Care Jireh Elmore: [name] Other Clinician: Referring Raela Bohl: [name] Treating Lateasha Breuer/Extender: Melburn Hake, [name]: 1 Edema Assessment Assessed: [Left: No] [Right: No] [Left: Edema] [Right: :] Calf Left: Right: Point of Measurement: 30 cm From Medial Instep 50.9 cm cm Ankle Left: Right: Point of Measurement: 12 cm From Medial Instep 31 cm cm Vascular Assessment Pulses: Dorsalis Pedis Palpable: [Left:Yes] Posterior Tibial Extremity colors, hair growth, and conditions: Extremity Color: [Left:Hyperpigmented] Hair Growth on Extremity: [Left:Yes] Temperature of Extremity: [Left:Warm] Capillary Refill: [Left:< 3 seconds] Toe Nail Assessment Left: Right: Thick: Yes Discolored: Yes Deformed: Yes Improper Length and Hygiene: Yes Notes We are not treating right leg. Patient is being seen and treated by dermatology. 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 Electronic Signature(s) Signed: 06/15/2018 4:45:46 PM By: [name], [name], RN, [name], [name],  [name] Entered By: [name], [name], RN, [name], [name] on 06/15/2018 08:29:56 [name], [name] ([id number]) -------------------------------------------------------------------------------- Multi Wound Chart Details Patient Name: [name] Date of Service: 06/15/2018 8:00 AM Medical Record Number: [id number] Patient Account Number: [id number] Date of Birth/Sex: 12-10-1980 (37 y.o. 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] [N/A:N/A] Comorbid History: [1:Sleep Apnea, Hypertension, Colitis] [N/A:N/A] Date Acquired: [1:11/18/2015] [N/A:N/A] Weeks of Treatment: [1:79] [N/A:N/A] Wound Status: [1:Open] [N/A:N/A] Measurements L x W x D [1:2.5x3.7x0.3] [N/A:N/A] (cm) Area (cm) : [1:7.265] [N/A:N/A] Volume (cm) : [1:2.179] [N/A:N/A] % Reduction in Area: [1:-48.00%] [N/A:N/A] % Reduction in Volume: [1:44.50%] [N/A:N/A] Classification: [1:Full Thickness With Exposed Support Structures] [N/A:N/A] Exudate Amount: [1:Large] [N/A:N/A] Exudate Type: [1:Serous] [N/A:N/A] Exudate Color: [1:amber] [N/A:N/A] Wound Margin: [1:Distinct, outline attached] [N/A:N/A] Granulation Amount: [1:None Present (0%)] [N/A:N/A] Necrotic Amount: [1:Large (67-100%)] [N/A:N/A] Exposed Structures: [1:Fat Layer (Subcutaneous Tissue) Exposed: Yes Muscle: Yes Fascia: No Tendon: No Joint: No Bone: No] [N/A:N/A] Epithelialization: [1:Small (1-33%)] [N/A:N/A] Periwound Skin Texture: [1:Induration: Yes Scarring: Yes Excoriation: No Callus: No Crepitus: No  Rash: No] [N/A:N/A] Periwound Skin Moisture: Maceration: No N/A N/A Dry/Scaly: No Periwound Skin Color: Hemosiderin Staining: Yes N/A N/A Atrophie Blanche: No Cyanosis: No Ecchymosis: No Erythema: No Mottled: No Pallor: No Rubor: No Temperature: No Abnormality N/A N/A Tenderness on Palpation: Yes N/A N/A Wound Preparation: Ulcer Cleansing: N/A N/A Rinsed/Irrigated with Saline Topical Anesthetic Applied: Other: lidocaine 4% Treatment Notes Electronic Signature(s) Signed: 06/15/2018 4:45:46 PM By: [name], [name], RN, [name], [name], [name] Entered By: [name], [name], RN, [name], [name] on 06/15/2018 08:23:40 [name], [name] ([id number]) -------------------------------------------------------------------------------- Multi-Disciplinary Care Plan Details Patient Name: [name] Date of Service: 06/15/2018 8:00 AM Medical Record Number: [id number] Patient Account Number: [id number] Date of Birth/Sex: Jan 22, 1981 (37 y.o. M) Treating RN: [name] Primary Care Corbett Moulder: [name] Other Clinician: Referring Tony Friscia: [name] Treating Fate Caster/Extender: Melburn Hake, [name]: 40 Active Inactive Orientation to the Wound Care Program Nursing Diagnoses: Knowledge deficit related to the wound healing center program Goals: Patient/caregiver will verbalize understanding of the [place] Program Date Initiated: 12/11/2016 Target Resolution Date: 03/13/2017 Goal Status: Active Interventions: Provide education on orientation to the wound center Notes: Venous Leg Ulcer Nursing Diagnoses: Knowledge deficit related to disease process and management Potential for venous Insuffiency (use before diagnosis confirmed) Goals: Non-invasive venous studies are completed as ordered Date Initiated: 12/11/2016 Target Resolution Date: 03/13/2017 Goal Status: Active Patient will maintain optimal edema control Date Initiated: 12/11/2016 Target Resolution Date: 03/13/2017 Goal  Status: Active Patient/caregiver will verbalize understanding of disease process and disease management Date Initiated: 12/11/2016 Target Resolution Date: 03/13/2017 Goal Status: Active Verify adequate tissue perfusion prior to therapeutic compression application Date Initiated: 12/11/2016 Target Resolution Date: 03/13/2017 Goal Status: Active Interventions: Assess peripheral edema status every visit. Compression as ordered Provide education on venous insufficiency Treatment Activities: [name], [name] ([id number]) Therapeutic compression applied : 12/11/2016 Notes: Wound/Skin Impairment Nursing Diagnoses: Impaired tissue integrity Knowledge deficit related to ulceration/compromised skin integrity Goals: Patient/caregiver will verbalize understanding of skin care regimen Date Initiated: 12/11/2016 Target Resolution Date: 03/13/2017 Goal Status: Active Ulcer/skin breakdown will have a volume reduction of 30% by week 4 Date Initiated: 12/11/2016 Target Resolution Date: 03/13/2017 Goal Status: Active Ulcer/skin breakdown will have a volume reduction of 50% by week 8 Date Initiated: 12/11/2016 Target Resolution Date: 03/13/2017 Goal Status: Active Ulcer/skin breakdown will have a volume reduction of 80% by week 12 Date Initiated: 12/11/2016 Target Resolution Date: 03/13/2017 Goal Status: Active Ulcer/skin breakdown will heal within 14 weeks Date Initiated: 12/11/2016 Target Resolution Date: 03/13/2017 Goal Status: Active Interventions:  Assess patient/caregiver ability to obtain necessary supplies Assess patient/caregiver ability to perform ulcer/skin care regimen upon admission and as needed Assess ulceration(s) every visit Provide education on ulcer and skin care Treatment Activities: Skin care regimen initiated : 12/11/2016 Topical wound management initiated : 12/11/2016 Notes: Electronic Signature(s) Signed: 06/15/2018 4:45:46 PM By: [name], [name], RN, [name], [name], [name] Entered By:  [name], [name], RN, [name], [name] on 06/15/2018 08:23:32 [name] ([id number]) -------------------------------------------------------------------------------- Pain Assessment Details Patient Name: [name] Date of Service: 06/15/2018 8:00 AM Medical Record Number: [id number] Patient Account Number: [id number] Date of Birth/Sex: 1980-07-20 (37 y.o. M) Treating RN: [name] Primary Care Sherrita Riederer: [name] Other Clinician: Referring Rebie Peale: [name] Treating Yasmeen Manka/Extender: Melburn Hake, [name]: 34 Active Problems Location of Pain Severity and Description of Pain Patient Has Paino No Site Locations Pain Management and Medication Current Pain Management: Electronic Signature(s) Signed: 06/15/2018 2:36:35 PM By: Lorine Bears RCP, RRT, CHT Signed: 06/15/2018 4:45:46 PM By: [name], [name], RN, [name], [name], [name] Entered By: Lorine Bears on 06/15/2018 08:08:14 [name] ([id number]) -------------------------------------------------------------------------------- Patient/Caregiver Education Details Patient Name: [name] Date of Service: 06/15/2018 8:00 AM Medical Record Number: [id number] Patient Account Number: [id number] Date of Birth/Gender: [birth year] (37 y.o. M) Treating RN: [name] Primary Care Physician: [name] Other Clinician: Referring Physician: [name] Treating Physician/Extender: Sharalyn Ink in Treatment: 33 Education Assessment Education Provided To: Patient Education Topics Provided Infection: Handouts: Infection Prevention and Management Methods: Demonstration, Explain/Verbal Responses: State content correctly Wound/Skin Impairment: Handouts: Caring for Your Ulcer Methods: Demonstration, Explain/Verbal Responses: State content correctly Electronic Signature(s) Signed: 06/15/2018 4:45:46 PM By: [name], [name], RN, [name], [name], [name] Entered By: [name], [name], RN, [name], [name] on  06/15/2018 08:36:30 [name] ([id number]) -------------------------------------------------------------------------------- Wound Assessment Details Patient Name: [name] Date of Service: 06/15/2018 8:00 AM Medical Record Number: [id number] Patient Account Number: [id number] Date of Birth/Sex: 07-05-1980 (37 y.o. M) Treating RN: [name] Primary Care Pape Parson: [name] Other Clinician: Referring Monroe Qin: [name] Treating Antoria Lanza/Extender: Melburn Hake, [name]: 70 Wound Status Wound Number: 1 Primary Etiology: Pyoderma Wound Location: Left Lower Leg - Lateral Wound Status: Open Wounding Event: Gradually Appeared Comorbid History: Sleep Apnea, Hypertension, Colitis Date Acquired: 11/18/2015 Weeks Of Treatment: 79 Clustered Wound: No Photos Photo Uploaded By: [name], [name], RN, [name], [name] on 06/15/2018 11:55:51 Wound Measurements Length: (cm) 2.5 Width: (cm) 3.7 Depth: (cm) 0.3 Area: (cm) 7.265 Volume: (cm) 2.179 % Reduction in Area: -48% % Reduction in Volume: 44.5% Epithelialization: Small (1-33%) Tunneling: No Undermining: No Wound Description Full Thickness With Exposed Support Foul Odor Classification: Structures Slough/Fi Wound Margin: Distinct, outline attached Exudate Large Amount: Exudate Type: Serous Exudate Color: amber After Cleansing: No brino Yes Wound Bed Granulation Amount: None Present (0%) Exposed Structure Necrotic Amount: Large (67-100%) Fascia Exposed: No Necrotic Quality: Adherent Slough Fat Layer (Subcutaneous Tissue) Exposed: Yes Tendon Exposed: No Muscle Exposed: Yes Necrosis of Muscle: No Joint Exposed: No [name], [name] ([id number]) Bone Exposed: No Periwound Skin Texture Texture Color No Abnormalities Noted: No No Abnormalities Noted: No Callus: No Atrophie Blanche: No Crepitus: No Cyanosis: No Excoriation: No Ecchymosis: No Induration: Yes Erythema: No Rash: No Hemosiderin Staining:  Yes Scarring: Yes Mottled: No Pallor: No Moisture Rubor: No No Abnormalities Noted: No Dry / Scaly: No Temperature / Pain Maceration: No Temperature: No Abnormality Tenderness on Palpation: Yes Wound Preparation Ulcer Cleansing: Rinsed/Irrigated with Saline Topical Anesthetic Applied: Other: lidocaine 4%, Treatment Notes Wound #1 (Left, Lateral  Lower Leg) Notes Triple antibiotic, BFD and patient's own compression Electronic Signature(s) Signed: 06/15/2018 4:45:46 PM By: [name], [name], RN, [name], [name], [name] Entered By: [name], [name], RN, [name], [name] on 06/15/2018 08:21:01 [name], [name] ([id number]) -------------------------------------------------------------------------------- Vitals Details Patient Name: [name] Date of Service: 06/15/2018 8:00 AM Medical Record Number: [id number] Patient Account Number: [id number] Date of Birth/Sex: 1981-02-13 (37 y.o. M) Treating RN: [name] Primary Care Ronelle Michie: [name] Other Clinician: Referring Marti Mclane: [name] Treating Jurnei Latini/Extender: Melburn Hake, [name]: 39 Vital Signs Time Taken: 08:08 Temperature (F): 98.5 Height (in): 71 Pulse (bpm): 105 Weight (lbs): 338 Respiratory Rate (breaths/min): 18 Body Mass Index (BMI): 47.1 Blood Pressure (mmHg): 139/67 Reference Range: 80 - 120 mg / dl Airway Electronic Signature(s) Signed: 06/15/2018 2:36:35 PM By: Lorine Bears RCP, RRT, CHT Entered By: Lorine Bears on 06/15/2018 08:11:34

## 2018-06-22 LAB — METHEMOGLOBIN, BLOOD
Methemoglobin %: 0 % (ref 0.0–1.6)
Methemoglobin: 0 g/dL

## 2018-06-25 NOTE — Unmapped (Signed)
Called patient to check on progress since LV.  He reports that overall he has seen a slight improvement since his last visit.  He continues on doxycycline and is using his compression stockings as directed.  He did have his methemoglobin level checked by his PCP, which he reports came back as zero.  He has, however, stopped his dapsone in the interim, and feels like his previous shortness of breath has subsequently improved.  He notes that he has an appointment with wound care on Monday to reevaluate his legs and will have an opportunity to discuss any newer concerns about healing with them at that time.  He notes that he has a follow-up appointment with dermatology on February 19 and thinks that he will be okay to follow-up at that time unless something significantly changes in the interim.  He reports that he will let us know if things get significantly worse over the next week and he may try to come in earlier if so.      All other questions answered to his satisfaction.  We will plan for follow-up on February 19 as planned.

## 2018-06-29 ENCOUNTER — Encounter: Payer: 59 | Attending: Physician Assistant | Admitting: Physician Assistant

## 2018-06-29 DIAGNOSIS — L280 Lichen simplex chronicus: Secondary | ICD-10-CM | POA: Insufficient documentation

## 2018-06-29 DIAGNOSIS — Z823 Family history of stroke: Secondary | ICD-10-CM | POA: Insufficient documentation

## 2018-06-29 DIAGNOSIS — Z8249 Family history of ischemic heart disease and other diseases of the circulatory system: Secondary | ICD-10-CM | POA: Diagnosis not present

## 2018-06-29 DIAGNOSIS — L97222 Non-pressure chronic ulcer of left calf with fat layer exposed: Secondary | ICD-10-CM | POA: Insufficient documentation

## 2018-06-29 DIAGNOSIS — E11622 Type 2 diabetes mellitus with other skin ulcer: Secondary | ICD-10-CM | POA: Diagnosis not present

## 2018-06-29 DIAGNOSIS — L88 Pyoderma gangrenosum: Secondary | ICD-10-CM | POA: Diagnosis not present

## 2018-06-29 DIAGNOSIS — B9561 Methicillin susceptible Staphylococcus aureus infection as the cause of diseases classified elsewhere: Secondary | ICD-10-CM | POA: Diagnosis not present

## 2018-06-29 DIAGNOSIS — T781XXA Other adverse food reactions, not elsewhere classified, initial encounter: Secondary | ICD-10-CM | POA: Diagnosis not present

## 2018-06-29 DIAGNOSIS — I872 Venous insufficiency (chronic) (peripheral): Secondary | ICD-10-CM | POA: Diagnosis not present

## 2018-06-29 DIAGNOSIS — K5190 Ulcerative colitis, unspecified, without complications: Secondary | ICD-10-CM | POA: Diagnosis not present

## 2018-07-01 NOTE — Progress Notes (Signed)
[name], [name] ([id number]) Visit Report for 06/29/2018 Chief Complaint Document Details Patient Name: JONAVIN, SEDER Date of Service: 06/29/2018 8:00 AM Medical Record Number: [id number] Patient Account Number: [id number] Date of Birth/Sex: [birth year] (38 y.o. M) Treating RN: [name] Primary Care Provider: [name] Other Clinician: Referring Provider: [name] Treating Provider/Extender: Melburn Hake, Anthea Udovich Weeks in Treatment: 55 Information Obtained from: Patient Chief Complaint He is here in follow up evaluation for LLE pyoderma ulcer Electronic Signature(s) Signed: 06/30/2018 5:08:21 PM By: Worthy Keeler PA-C Entered By: Worthy Keeler on 06/29/2018 08:11:44 [name] ([id number]) -------------------------------------------------------------------------------- HPI Details Patient Name: [name] Date of Service: 06/29/2018 8:00 AM Medical Record Number: [id number] Patient Account Number: [id number] Date of Birth/Sex: 09/07/1980 (38 y.o. M) Treating RN: [name] Primary Care Provider: [name] Other Clinician: Referring Provider: [name] Treating Provider/Extender: Melburn Hake, Johann Santone Weeks in Treatment: 28 History of Present Illness HPI Description: 12/04/16; 38 year old man who comes into the clinic today for review of a wound on the posterior left calf. He tells me that is been there for about a year. He is not a diabetic he does smoke half a pack per day. He was seen in the ER on 11/20/16 felt to have cellulitis around the wound and was given clindamycin. An x-ray did not show osteomyelitis. The patient initially tells me that he has a milk allergy that sets off a pruritic itching rash on his lower legs which she scratches incessantly and he thinks that's what may have set up the wound. He has been using various topical antibiotics and ointments without any effect. He works in a [employer] and is on his feet all day. He does not have a prior  history of wounds however he does have the rash on both lower legs the right arm and the ventral aspect of his left arm. These are excoriations and clearly have had scratching however there are of macular looking areas on both legs including a substantial larger area on the right leg. This does not have an underlying open area. There is no blistering. The patient tells me that 2 years ago in [place] in response to the rash on his legs he saw a dermatologist who told him he had a condition which may be pyoderma gangrenosum although I may be putting words into his mouth. He seemed to recognize this. 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 He is not in any treatment for this. He's had no recent travel 12/11/16; the patient arrives today with his wound and roughly the same condition we've been using silver alginate this is a deep punched out wound with some surrounding erythema but no tenderness. Biopsy I did did not show confirmed pyoderma gangrenosum suggested nonspecific inflammation and vasculitis but does not provide an actual description of what was seen by the pathologist. I'm really not able to understand this We have also received information from the patient's dermatologist in [place] notes from April 2016. This was a doctor Agarwal- antal. 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 He was prescribed topical steroid high potency under occlusion which helped but at this point the patient did not have a deep punched out wound. 12/18/16; the patient's wound is larger in terms of surface area however this surface looks better and there is less depth. The surrounding erythema also is better. The patient states that the wrap we put on came off 2 days ago when he has been using  his compression stockings. He we are in the process of getting a dermatology consult. 12/26/16 on evaluation today patient's left lower extremity wound shows evidence of  infection with surrounding erythema noted. He has been tolerating the dressing changes but states that he has noted more discomfort. There is a larger area of erythema surrounding the wound. No fevers, chills, nausea, or vomiting noted at this time. With that being said the wound still does have slough covering the surface. He is not allergic to any medication that he is aware of at this point. In regard to his right lower extremity he had several regions that are erythematous and pruritic he wonders if there's anything we can do to help that. 01/02/17 I reviewed patient's wound culture which was obtained his visit last week. He was placed on doxycycline at that point. Unfortunately that does not appear to be an antibiotic that would likely help with the situation however the pseudomonas noted on culture is sensitive to Cipro. Also unfortunately patient's wound seems to have a large compared to last week's evaluation. Not severely so but there are definitely increased measurements in general. He is continuing to have discomfort as well he writes this to be a seven out of 10. In fact he would prefer me not to perform any debridement today due to the fact that he is having discomfort and considering he has an active infection on the little reluctant to do so anyway. No fevers, chills, nausea, or vomiting noted at this time. 01/08/17; patient seems dermatology on September 5. I suspect dermatology will want the slides from the biopsy I did sent to their pathologist. I'm not sure if there is a way we can expedite that. In any case the culture I did before I left on vacation 3 weeks ago showed Pseudomonas he was given 10 days of Cipro and per her description of her intake nurses is actually somewhat better this week although the wound is quite a bit bigger than I remember the last time I saw this. He still has 3 more days of Cipro MAURO, ARPS ([id number]) 01/21/17; dermatology appointment tomorrow. He has  completed the ciprofloxacin for Pseudomonas. Surface of the wound looks better however he is had some deterioration in the lesions on his right leg. Meantime the left lateral leg wound we will continue with sample 01/29/17; patient had his dermatology appointment but I can't yet see that note. He is completed his antibiotics. The wound is more superficial but considerably larger in circumferential area than when he came in. This is in his left lateral calf. He also has swollen erythematous areas with superficial wounds on the right leg and small papular areas on both arms. There apparently areas in her his upper thighs and buttocks I did not look at those. Dermatology biopsied the right leg. Hopefully will have their input next week. 02/05/17; patient went back to see his dermatologist who told him that he had a "scratching problem" as well as staph. He is now on a 30 day course of doxycycline and I believe she gave him triamcinolone cream to the right leg areas to help with the itching [not exactly sure but probably triamcinolone]. She apparently looked at the left lateral leg wound although this was not rebiopsied and I think felt to be ultimately part of the same pathogenesis. He is using sample border foam and changing nevus himself. He now has a new open area on the right posterior leg which was his biopsy site I don't  have any of the dermatology notes 02/12/17; we put the patient in compression last week with SANTYL to the wound on the left leg and the biopsy. Edema is much better and the depth of the wound is now at level of skin. Area is still the same oBiopsy site on the right lateral leg we've also been using santyl with a border foam dressing and he is changing this himself. 02/19/17; Using silver alginate started last week to both the substantial left leg wound and the biopsy site on the right wound. He is tolerating compression well. Has a an appointment with his primary M.D. tomorrow  wondering about diuretics although I'm wondering if the edema problem is actually lymphedema 02/26/17; the patient has been to see his primary doctor Dr. Jerrel Ivory at [facility] our primary [facility]. She started him on Lasix 20 mg and this seems to have helped with the edema. However we are not making substantial change with the left lateral calf wound and inflammation. The biopsy site on the right leg also looks stable but not really all that different. 03/12/17; the patient has been to see vein and vascular Dr. Lucky Cowboy. He has had venous reflux studies I have not reviewed these. I did get a call from his dermatology office. 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 They therefore recommended clobetasol and tetracycline i.e. 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 03/26/17; apparently the patient just had reflux studies not an appointment with Dr. dew. She arrives in clinic today having applied clobetasol for 2-3 weeks. He notes over the last 2-3 days excessive drainage having to change the dressing 3-4 times a day and also expanding erythema. 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 Lillia Carmel at [facility] dermatology locally who kindly saw him at my request. 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 My concern was the degree of erythema medially extending from his left leg wound which was either inflammation from  pyoderma or cellulitis. I put him on Augmentin however culture of the wound showed Pseudomonas which is quinolone sensitive. I really don't believe he has cellulitis however in view of everything I will continue and give him a course of Cipro. He is also on doxycycline as an immune modulator for the pyoderma. In addition to his original wound on the left lateral leg with surrounding erythema he has a wound on the right posterior calf which was an original biopsy site done by dermatology. 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 Saw Dr. Lillia Carmel yesterday. He has been using topical antibiotics to both wound areas his original wound on the left and the biopsies/pathergy area on the right. There is definitely some improvement in the inflammation around the wound on the right although the patient states he has increasing sensitivity of the wounds. He is on prednisone 60 and doxycycline 1 as prescribed by Dr. Lillia Carmel. He is covering the topical  antibiotic with gauze and putting this in his own compression stocks and changing this daily. He states that Dr. Lottie Rater did a culture of the left leg wound yesterday 05/07/17; pyoderma gangrenosum. The patient saw Dr. Lillia Carmel yesterday and has a follow-up with her in one month. He is still using topical antibiotics to both wounds although he can't recall exactly what type. He is still on prednisone 60 mg. Dr. Lillia Carmel stated that the doxycycline could stop if we were in agreement. 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 The right medial leg was induced by biopsy/pathergy. The area on the right is essentially healed. 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 The right medial leg has closed and  remains closed. He is still on prednisone 60. oHe tells me he missed his last dermatology appointment with Dr. Lillia Carmel but will make another appointment. He reports that her blood sugar at a recent screen in [place] was high 200's. He was 180 today. He is more cushingoid blood pressure is Tedder, Merit ([id number]) up a bit. I think he is going to require still much longer prednisone perhaps another 3 months before attempting to taper. In the meantime his wound is a lot better. Smaller. He is cleaning this off daily and applying topical antibiotics. When he was last in the clinic I thought about changing to [facility] and actually put in a couple of calls to dermatology although probably not during their business hours. 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 He continues on oral prednisone. He has been using triple antibiotic ointment. There is surface debris and we will transition to [facility] and have him return in 2 weeks. He has lost 30 pounds since his last appointment with lifestyle modification. He may benefit from topical steroid cream for treatment this can be considered at a later date. 08/22/17 on evaluation today patient appears to actually be doing rather well in regard to his left lateral lower extremity ulcer. He has actually been managed by Dr. Dellia Nims most recently. Patient is currently on oral steroids at this time. This seems to have been of benefit for him. Nonetheless his last visit was actually with Leah on 08/06/17. Currently he is not utilizing any topical steroid creams although this could be of benefit as well. No fevers, chills, nausea, or vomiting noted at this time. 09/05/17 on evaluation today patient appears to be doing better in regard to his left lateral lower extremity ulcer. He has been tolerating the dressing changes without complication.  He is using Santyl with good effect. Overall I'm very pleased with how things are standing at this point. Patient likewise is happy that this is doing better. 09/19/17 on evaluation today patient actually appears to be doing rather well in regard to his left lateral lower extremity ulcer. 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 He's not having any significant pain. 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 He has been tolerating the Santyl without complication and in general I feel like he's making good  progress. 10/17/17 on evaluation today patient appears to be doing very well in regard to his left lateral lower surety ulcer. He has been tolerating the dressing changes without complication. There does not appear to be any evidence of infection he's alternating the Santyl and the triple antibiotic ointment every other day this seems to be doing well for him. 11/03/17 on evaluation today patient appears to be doing very well in regard to his left lateral lower extremity ulcer. He is been tolerating the dressing changes without complication which is good news. Fortunately there does not appear to be any evidence of infection which is also great news. Overall is doing excellent they are starting to taper down on the prednisone is down to 40 mg at this point it also started topical clobetasol for him. 11/17/17 on evaluation today patient appears to be doing well in regard to his left lateral lower surety ulcer. He's been tolerating the dressing changes without complication. He does note that he is having no pain, no excessive drainage or discharge, and overall he feels like things are going about how he would expect and hope they would. Overall he seems to have no evidence of infection at this time in my opinion which is  good news. 12/04/17-He is seen in follow-up evaluation for right lateral lower extremity ulcer. He has been applying topical steroid cream. Today's measurement show slight increase in size. Over the next 2 weeks we will transition to every other day Santyl and steroid cream. He has been encouraged to monitor for changes and notify clinic with any concerns 12/15/17 on evaluation today patient's left lateral motion the ulcer and fortunately is doing worse again at this point. This just since last week to this week has close to doubled in size according to the patient. I did not seeing last week's I do not have a visual to compare this to in our system was also down so we do not have all the charts and at this point. Nonetheless it does have me somewhat concerned in regard to the fact that again he was worried enough about it he has contact the dermatology that placed them back on the full strength, 50 mg a day of the prednisone that he was taken previous. He continues to alternate using clobetasol along with Santyl at this point. He is obviously somewhat frustrated. 12/22/17 on evaluation today patient appears to be doing a little worse compared to last evaluation. Unfortunately the wound is a little deeper and slightly larger than the last week's evaluation. With that being said he has made some progress in regard to the irritation surrounding at this time unfortunately despite that progress that's been made he still has a significant issue going on here. 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 For that reason I discussed with him today the possibility of perform a wound culture to ensure there's nothing overtly infected. 01/06/18 on evaluation today patient's wound is larger and deeper than previously evaluated. With that being said it did appear that  his wound was infected after my last evaluation with him. Subsequently I did end up prescribing a prescription for Bactrim DS which she has been taking and having no complication with. Fortunately there does not appear to be any evidence of Spira, [name] ([id number]) infection at this point in time as far as anything spreading, no want to  touch, and overall I feel like things are showing signs of improvement. 01/13/18 on evaluation today patient appears to be even a little larger and deeper than last time. There still muscle exposed in the base of the wound. Nonetheless he does appear to be less erythematous I do believe inflammation is calming down also believe the infection looks like it's probably resolved at this time based on what I'm seeing. No fevers, chills, nausea, or vomiting noted at this time. 01/30/18 on evaluation today patient actually appears to visually look better for the most part. Unfortunately those visually this looks better he does seem to potentially have what may be an abscess in the muscle that has been noted in the central portion of the wound. This is the first time that I have noted what appears to be fluctuance in the central portion of the muscle. With that being said I'm somewhat more concerned about the fact that this might indicate an abscess formation at this location. I do believe that an ultrasound would be appropriate. This is likely something we need to try to do as soon as possible. He has been switch to mupirocin ointment and he is no longer using the steroid ointment as prescribed by dermatology he sees them again next week he's been decreased from 60 to 40 mg of prednisone. 03/09/18 on evaluation today patient actually appears to be doing a little better compared to last time I saw him. There's not as much erythema surrounding the wound itself. He I did review his most recent infectious disease note which was dated 02/24/18. He saw Dr. Michel Bickers in  [place]. 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 Patient is now off of antibiotics and they are continually observing this. There seems to be no change in the past two weeks in my pinion based on what the patient says and what I see today compared to what Dr. Megan Salon likely saw two weeks ago. No fevers, chills, nausea, or vomiting noted at this time. 03/23/18 on evaluation today patient's wound actually appears to be showing signs of improvement which is good news. He is currently still on the Dapsone. He is also working on tapering the prednisone to get off of this and Dr. Lottie Rater is working with him in this regard. Nonetheless overall I feel like the wound is doing well it does appear based on the infectious disease note that I reviewed from Dr. 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 I did also review the results of his ultrasound of left lower extremity which revealed there was a dentist tissue in the base of the wound without an abscess noted. 04/06/18 on evaluation today the patient's left lateral lower extremity ulcer actually appears to be doing fairly well which is excellent news. There does not appear to be any evidence of infection at this time which is also great news. Overall he still does have a significantly large ulceration although little by little he seems to be making progress. He is down to 10 mg a day of the prednisone. 04/20/18 on evaluation today patient actually appears to be doing excellent at this time in regard to his left lower extremity ulcer. He's making signs of good progress unfortunately this is taking much longer than we would really like  to see but nonetheless he is making progress. Fortunately there does not appear to be any evidence of infection at this  time. No fevers, chills, nausea, or vomiting noted at this time. The patient has not been using the Santyl due to the cost he hadn't got in this field yet. He's mainly been using the antibiotic ointment topically. Subsequently he also tells me that he really has not been scrubbing in the shower I think this would be helpful again as I told him it doesn't have to be anything too aggressive to even make it believe just enough to keep it free of some of the loose slough and biofilm on the wound surface. 05/11/18 on evaluation today patient's wound appears to be making slow but sure progress in regard to the left lateral lower extremity ulcer. He is been tolerating the dressing changes without complication. Fortunately there does not appear to be any evidence of infection at this time. He is still just using triple antibiotic ointment along with clobetasol occasionally over the area. He never got the Santyl and really does not seem to intend to in my pinion. 06/01/18 on evaluation today patient appears to be doing a little better in regard to his left lateral lower extremity ulcer. He states that overall he does not feel like he is doing as well with the Dapsone as he did with the prednisone. Nonetheless he sees his dermatologist later today and is gonna talk to them about the possibility of going back on the prednisone. Overall again I believe that the wound would be better if you would utilize Santyl but he really does not seem to be interested in going back to the [place] at this point. He has been using triple antibiotic ointment. 06/15/18 on evaluation today patient's wound actually appears to be doing about the same at this point. Fortunately there is no signs of infection at this time. He has made slight improvements although he continues to not really want to clean the wound bed at this point. He states that he just doesn't mess with it he doesn't want to cause any problems with everything else  he has going on. He has been on medication, antibiotics as prescribed by his dermatologist, for a staff infection of his lower extremities which is really drying out now and looking much better he tells me. Fortunately there is no sign of overall infection. DEKLYN, GIBBON ([id number]) 06/29/18 on evaluation today patient appears to be doing well in regard to his left lateral lower surety ulcer all things considering. Fortunately his staff infection seems to be greatly improved compared to previous. He has no signs of infection and this is drying up quite nicely. He is still the doxycycline for this is no longer on cental, Dapsone, or any of the other medications. His dermatologist has recommended possibility of an infusion but right now he does not want to proceed with that. Electronic Signature(s) Signed: 06/30/2018 5:08:21 PM By: Worthy Keeler PA-C Entered By: Worthy Keeler on 06/29/2018 08:51:30 JALIEL, DEAVERS ([id number]) -------------------------------------------------------------------------------- Physical Exam Details Patient Name: [name] Date of Service: 06/29/2018 8:00 AM Medical Record Number: [id number] Patient Account Number: [id number] Date of Birth/Sex: 1980/06/24 (38 y.o. M) Treating RN: [name] Primary Care Provider: [name] Other Clinician: Referring Provider: [name] Treating Provider/Extender: Melburn Hake, Jaquell Seddon Weeks in Treatment: 3 Constitutional Well-nourished and well-hydrated in no acute distress. Respiratory normal breathing without difficulty. clear to auscultation bilaterally. Cardiovascular regular rate and rhythm with normal S1, S2.  Psychiatric this patient is able to make decisions and demonstrates good insight into disease process. Alert and Oriented x 3. pleasant and cooperative. Notes Patient's wound bed currently shows signs of good granulation at this time underneath the slough which was able to be gently cleansed off with  saline and gauze today. Post debridement the wound bed appears to be doing significantly better which is great news. Fortunately there is no sign of infection at this point. Electronic Signature(s) Signed: 06/30/2018 5:08:21 PM By: Worthy Keeler PA-C Entered By: Worthy Keeler on 06/29/2018 08:51:56 DHILLON, COMUNALE ([id number]) -------------------------------------------------------------------------------- Physician Orders Details Patient Name: [name] Date of Service: 06/29/2018 8:00 AM Medical Record Number: [id number] Patient Account Number: [id number] Date of Birth/Sex: 1980/09/18 (38 y.o. 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 Anesthetic (add to Medication List) Wound #1 Left,Lateral Lower Leg o Topical Lidocaine 4% cream applied to wound bed prior to debridement (In Clinic Only). Primary Wound Dressing Wound #1 Left,Lateral Lower Leg o Other: - Triple antibiotic ointment Secondary Dressing Wound #1 Left,Lateral Lower Leg o Boardered Foam Dressing Dressing Change Frequency Wound #1 Left,Lateral Lower Leg o Change dressing every day. Follow-up Appointments Wound #1 Left,Lateral Lower Leg o Return Appointment in 2 weeks. Edema Control Wound #1 Left,Lateral Lower Leg o Patient to wear own compression stockings o Elevate legs to the level of the heart and pump ankles as often as possible Medications-please add to medication list. Wound #1 Left,Lateral Lower Leg o P.O.  Antibiotics - Prescribed by Dermatology for Staph Infection o Other: - Continue steroids DOY, TAAFFE ([id number]) Electronic Signature(s) Signed: 06/30/2018 5:08:21 PM By: Worthy Keeler PA-C Entered By: Worthy Keeler on 06/29/2018 08:43:58 Rone, [name] ([id number]) -------------------------------------------------------------------------------- Problem List Details Patient Name: [name] Date of Service: 06/29/2018 8:00 AM Medical Record Number: [id number] Patient Account Number: [id number] Date of Birth/Sex: 10/24/1980 (38 y.o. M) Treating RN: [name] Primary Care Provider: [name] Other Clinician: Referring Provider: [name] Treating Provider/Extender: Melburn Hake, Lashon Hillier Weeks in Treatment: 10 Active Problems ICD-10 Evaluated Encounter Code Description Active Date Today Diagnosis L97.222 Non-pressure chronic ulcer of left calf with fat layer exposed 12/04/2016 No Yes L88 Pyoderma gangrenosum 03/26/2017 No Yes I87.2 Venous insufficiency (chronic) (peripheral) 12/04/2016 No Yes Inactive Problems ICD-10 Code Description Active Date Inactive Date L97.213 Non-pressure chronic ulcer of right calf with necrosis of muscle 04/02/2017 04/02/2017 Resolved Problems Electronic Signature(s) Signed: 06/30/2018 5:08:21 PM By: Worthy Keeler PA-C Entered By: Worthy Keeler on 06/29/2018 08:11:29 [name] ([id number]) -------------------------------------------------------------------------------- Progress Note Details Patient Name: [name] Date of Service: 06/29/2018 8:00 AM Medical Record Number: [id number] Patient Account Number: [id number] Date of Birth/Sex: [birth year] (38 y.o. M) Treating RN: [name] Primary Care Provider: [name] Other Clinician: Referring Provider: [name] Treating Provider/Extender: Melburn Hake, Corby Villasenor Weeks in Treatment: 57 Subjective Chief Complaint Information obtained from Patient He is here in follow up  evaluation for LLE pyoderma ulcer History of Present Illness (HPI) 12/04/16; 38 year old man who comes into the clinic today for review of a wound on the posterior left calf. He tells me that is been there  for about a year. He is not a diabetic he does smoke half a pack per day. He was seen in the ER on 11/20/16 felt to have cellulitis around the wound and was given clindamycin. An x-ray did not show osteomyelitis. The patient initially tells me that he has a milk allergy that sets off a pruritic itching rash on his lower legs which she scratches incessantly and he thinks that's what may have set up the wound. He has been using various topical antibiotics and ointments without any effect. He works in a [employer] and is on his feet all day. He does not have a prior history of wounds however he does have the rash on both lower legs the right arm and the ventral aspect of his left arm. These are excoriations and clearly have had scratching however there are of macular looking areas on both legs including a substantial larger area on the right leg. This does not have an underlying open area. There is no blistering. The patient tells me that 2 years ago in [place] in response to the rash on his legs he saw a dermatologist who told him he had a condition which may be pyoderma gangrenosum although I may be putting words into his mouth. He seemed to recognize this. 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 He is not in any treatment for this. He's had no recent travel 12/11/16; the patient arrives today with his wound and roughly the same condition we've been using silver alginate this is a deep punched out wound with some surrounding erythema but no tenderness. Biopsy I did did not show confirmed pyoderma gangrenosum suggested nonspecific inflammation and vasculitis but does not provide an actual description of what was seen by the pathologist. I'm really not able  to understand this We have also received information from the patient's dermatologist in [place] notes from April 2016. This was a doctor Agarwal- antal. 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 He was prescribed topical steroid high potency under occlusion which helped but at this point the patient did not have a deep punched out wound. 12/18/16; the patient's wound is larger in terms of surface area however this surface looks better and there is less depth. The surrounding erythema also is better. The patient states that the wrap we put on came off 2 days ago when he has been using his compression stockings. He we are in the process of getting a dermatology consult. 12/26/16 on evaluation today patient's left lower extremity wound shows evidence of infection with surrounding erythema noted. He has been tolerating the dressing changes but states that he has noted more discomfort. There is a larger area of erythema surrounding the wound. No fevers, chills, nausea, or vomiting noted at this time. With that being said the wound still does have slough covering the surface. He is not allergic to any medication that he is aware of at this point. In regard to his right lower extremity he had several regions that are erythematous and pruritic he wonders if there's anything we can do to help that. 01/02/17 I reviewed patient's wound culture which was obtained his visit last week. He was placed on doxycycline at that point. Unfortunately that does not appear to be an antibiotic that would likely help with the situation however the pseudomonas noted on culture is sensitive to Cipro. Also unfortunately patient's wound seems to have a large compared to last week's evaluation. Not  severely so but there are definitely increased measurements in general. He is continuing to have discomfort as well he writes this to be a seven out of 10. In fact he would prefer me not to perform any debridement today due  to the fact that he is having discomfort and considering he has an active infection on the little reluctant to do so anyway. No fevers, Miranda, Lemoine ([id number]) chills, nausea, or vomiting noted at this time. 01/08/17; patient seems dermatology on September 5. I suspect dermatology will want the slides from the biopsy I did sent to their pathologist. I'm not sure if there is a way we can expedite that. In any case the culture I did before I left on vacation 3 weeks ago showed Pseudomonas he was given 10 days of Cipro and per her description of her intake nurses is actually somewhat better this week although the wound is quite a bit bigger than I remember the last time I saw this. He still has 3 more days of Cipro 01/21/17; dermatology appointment tomorrow. He has completed the ciprofloxacin for Pseudomonas. Surface of the wound looks better however he is had some deterioration in the lesions on his right leg. Meantime the left lateral leg wound we will continue with sample 01/29/17; patient had his dermatology appointment but I can't yet see that note. He is completed his antibiotics. The wound is more superficial but considerably larger in circumferential area than when he came in. This is in his left lateral calf. He also has swollen erythematous areas with superficial wounds on the right leg and small papular areas on both arms. There apparently areas in her his upper thighs and buttocks I did not look at those. Dermatology biopsied the right leg. Hopefully will have their input next week. 02/05/17; patient went back to see his dermatologist who told him that he had a "scratching problem" as well as staph. He is now on a 30 day course of doxycycline and I believe she gave him triamcinolone cream to the right leg areas to help with the itching [not exactly sure but probably triamcinolone]. She apparently looked at the left lateral leg wound although this was not rebiopsied and I think felt to be  ultimately part of the same pathogenesis. He is using sample border foam and changing nevus himself. He now has a new open area on the right posterior leg which was his biopsy site I don't have any of the dermatology notes 02/12/17; we put the patient in compression last week with SANTYL to the wound on the left leg and the biopsy. Edema is much better and the depth of the wound is now at level of skin. Area is still the same Biopsy site on the right lateral leg we've also been using santyl with a border foam dressing and he is changing this himself. 02/19/17; Using silver alginate started last week to both the substantial left leg wound and the biopsy site on the right wound. He is tolerating compression well. Has a an appointment with his primary M.D. tomorrow wondering about diuretics although I'm wondering if the edema problem is actually lymphedema 02/26/17; the patient has been to see his primary doctor Dr. Jerrel Ivory at [facility] our primary care. She started him on Lasix 20 mg and this seems to have helped with the edema. However we are not making substantial change with the left lateral calf wound and inflammation. The biopsy site on the right leg also looks stable but not really all  that different. 03/12/17; the patient has been to see vein and vascular Dr. Lucky Cowboy. He has had venous reflux studies I have not reviewed these. I did get a call from his dermatology office. 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 They therefore recommended clobetasol and tetracycline i.e. 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 03/26/17; apparently the patient just had reflux studies not an appointment with Dr. dew. She arrives in  clinic today having applied clobetasol for 2-3 weeks. He notes over the last 2-3 days excessive drainage having to change the dressing 3-4 times a day and also expanding erythema. 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 Lillia Carmel at [facility] dermatology locally who kindly saw him at my request. 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 My concern was the degree of erythema medially extending from his left leg wound which was either inflammation from pyoderma or cellulitis. I put him on Augmentin however culture of the wound showed Pseudomonas which is quinolone sensitive. I really don't believe he has cellulitis however in view of everything I will continue and give him a course of Cipro. He is also on doxycycline as an immune modulator for the pyoderma. In addition to his original wound on the left lateral leg with surrounding erythema he has a wound on the right posterior calf which was an original biopsy site done by dermatology. 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 Saw Dr. Lillia Carmel yesterday. He has been using topical antibiotics to both wound areas his original wound on the left and the biopsies/pathergy area on the right. There is definitely some improvement in the inflammation around the wound on the right although the patient states he has increasing sensitivity of the wounds. He is on prednisone 60 and doxycycline 1 as prescribed by Dr. Lillia Carmel. He is covering the topical antibiotic with gauze and putting this in his own compression stocks and changing this daily. He states that Dr. Lottie Rater did a culture of the  left leg wound yesterday 05/07/17; pyoderma gangrenosum. The patient saw Dr. Lillia Carmel yesterday and has a follow-up with her in one month. He is still using topical antibiotics to both wounds although he can't recall exactly what type. He is still on prednisone 60 mg. Dr. Lillia Carmel stated that the doxycycline could stop if we were in agreement. 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 The right medial leg was induced by [name] ([id number]) biopsy/pathergy. The area on the right is essentially healed. 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 The right medial leg has closed and remains closed. He is still on prednisone 60. He tells me he missed his last dermatology appointment with Dr. Lillia Carmel but will make another appointment. He reports that her blood  sugar at a recent screen in [place] was high 200's. He was 180 today. 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 I think he is going to require still much longer prednisone perhaps another 3 months before attempting to taper. In the meantime his wound is a lot better. Smaller. He is cleaning this off daily and applying topical antibiotics. When he was last in the clinic I thought about changing to [facility] and actually put in a couple of calls to dermatology although probably not during their business hours. 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 He continues on oral prednisone. He has been using triple antibiotic ointment. There is surface debris and we will transition to [facility] and have him return in 2 weeks. He has lost 30 pounds since his last appointment with lifestyle modification. He may benefit  from topical steroid cream for treatment this can be considered at a later date. 08/22/17 on evaluation today patient appears to actually be doing rather well in regard to his left lateral lower extremity ulcer. He has actually been managed by Dr. Dellia Nims most recently. Patient is currently on oral steroids at this time. This seems to have been of benefit for him. Nonetheless his last visit was actually with Leah on 08/06/17. Currently he is not utilizing any topical steroid creams although this could be of benefit as well. No fevers, chills, nausea, or vomiting noted at this time. 09/05/17 on evaluation today patient appears to be doing better in regard to his left lateral lower extremity ulcer. He has been tolerating the dressing changes without complication. He is using Santyl with good effect. Overall I'm very pleased with how things are standing at this point. Patient likewise is happy that this is doing better. 09/19/17 on evaluation today patient actually appears to be doing rather well in regard to his left lateral lower extremity ulcer. 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 He's not having any significant pain. 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 He has been tolerating the Santyl without complication and in general I feel like he's making good progress. 10/17/17 on evaluation today patient appears to be doing very well in regard to his left lateral lower surety ulcer. He has been tolerating the dressing changes without complication. There does not appear to be any evidence of infection he's alternating the Santyl and the triple antibiotic ointment every other day this seems to be doing well for him. 11/03/17 on evaluation today patient appears to be doing very well in regard to his left lateral lower  extremity ulcer. He is been tolerating the dressing changes without complication which is good news. Fortunately there does not appear to be any evidence of infection which is also great news. Overall is doing excellent they are starting to taper down on the prednisone is down to 40 mg at this point it also started topical clobetasol for him. 11/17/17 on evaluation today patient appears to be doing well in regard to his left lateral lower surety ulcer. He's been tolerating the dressing changes without complication. He does note that he is having no pain, no excessive drainage or discharge, and overall he feels like  things are going about how he would expect and hope they would. Overall he seems to have no evidence of infection at this time in my opinion which is good news. 12/04/17-He is seen in follow-up evaluation for right lateral lower extremity ulcer. He has been applying topical steroid cream. Today's measurement show slight increase in size. Over the next 2 weeks we will transition to every other day Santyl and steroid cream. He has been encouraged to monitor for changes and notify clinic with any concerns 12/15/17 on evaluation today patient's left lateral motion the ulcer and fortunately is doing worse again at this point. This just since last week to this week has close to doubled in size according to the patient. I did not seeing last week's I do not have a visual to compare this to in our system was also down so we do not have all the charts and at this point. Nonetheless it does have me somewhat concerned in regard to the fact that again he was worried enough about it he has contact the dermatology that placed them back on the full strength, 50 mg a day of the prednisone that he was taken previous. He continues to alternate using clobetasol along with Santyl at this point. He is obviously somewhat frustrated. 12/22/17 on evaluation today patient appears to be doing a little worse compared to  last evaluation. Unfortunately the wound is a little deeper and slightly larger than the last week's evaluation. With that being said he has made some progress in regard to the irritation surrounding at this time unfortunately despite that progress that's been made he still has a significant issue going on here. I'm not certain that he is having really any true infection at this time although with the Pyoderma gangrenosum it can Rowlette, Augustin ([id number]) sometimes be difficult to differentiate infection versus just inflammation. For that reason I discussed with him today the possibility of perform a wound culture to ensure there's nothing overtly infected. 01/06/18 on evaluation today patient's wound is larger and deeper than previously evaluated. With that being said it did appear that his wound was infected after my last evaluation with him. Subsequently I did end up prescribing a prescription for Bactrim DS which she has been taking and having no complication with. Fortunately there does not appear to be any evidence of infection at this point in time as far as anything spreading, no want to touch, and overall I feel like things are showing signs of improvement. 01/13/18 on evaluation today patient appears to be even a little larger and deeper than last time. There still muscle exposed in the base of the wound. Nonetheless he does appear to be less erythematous I do believe inflammation is calming down also believe the infection looks like it's probably resolved at this time based on what I'm seeing. No fevers, chills, nausea, or vomiting noted at this time. 01/30/18 on evaluation today patient actually appears to visually look better for the most part. Unfortunately those visually this looks better he does seem to potentially have what may be an abscess in the muscle that has been noted in the central portion of the wound. This is the first time that I have noted what appears to be fluctuance in  the central portion of the muscle. With that being said I'm somewhat more concerned about the fact that this might indicate an abscess formation at this location. I do believe that an ultrasound would be appropriate. This is likely something we  need to try to do as soon as possible. He has been switch to mupirocin ointment and he is no longer using the steroid ointment as prescribed by dermatology he sees them again next week he's been decreased from 60 to 40 mg of prednisone. 03/09/18 on evaluation today patient actually appears to be doing a little better compared to last time I saw him. There's not as much erythema surrounding the wound itself. He I did review his most recent infectious disease note which was dated 02/24/18. He saw Dr. Michel Bickers in [place]. 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 Patient is now off of antibiotics and they are continually observing this. There seems to be no change in the past two weeks in my pinion based on what the patient says and what I see today compared to what Dr. Megan Salon likely saw two weeks ago. No fevers, chills, nausea, or vomiting noted at this time. 03/23/18 on evaluation today patient's wound actually appears to be showing signs of improvement which is good news. He is currently still on the Dapsone. He is also working on tapering the prednisone to get off of this and Dr. Lottie Rater is working with him in this regard. Nonetheless overall I feel like the wound is doing well it does appear based on the infectious disease note that I reviewed from Dr. 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 I did also review the results of his ultrasound of left lower extremity which revealed there was a dentist tissue in the base of the wound without an  abscess noted. 04/06/18 on evaluation today the patient's left lateral lower extremity ulcer actually appears to be doing fairly well which is excellent news. There does not appear to be any evidence of infection at this time which is also great news. Overall he still does have a significantly large ulceration although little by little he seems to be making progress. He is down to 10 mg a day of the prednisone. 04/20/18 on evaluation today patient actually appears to be doing excellent at this time in regard to his left lower extremity ulcer. He's making signs of good progress unfortunately this is taking much longer than we would really like to see but nonetheless he is making progress. Fortunately there does not appear to be any evidence of infection at this time. No fevers, chills, nausea, or vomiting noted at this time. The patient has not been using the Santyl due to the cost he hadn't got in this field yet. He's mainly been using the antibiotic ointment topically. Subsequently he also tells me that he really has not been scrubbing in the shower I think this would be helpful again as I told him it doesn't have to be anything too aggressive to even make it believe just enough to keep it free of some of the loose slough and biofilm on the wound surface. 05/11/18 on evaluation today patient's wound appears to be making slow but sure progress in regard to the left lateral lower extremity ulcer. He is been tolerating the dressing changes without complication. Fortunately there does not appear to be any evidence of infection at this time. He is still just using triple antibiotic ointment along with clobetasol occasionally over the area. He never got the [place] and  really does not seem to intend to in my pinion. 06/01/18 on evaluation today patient appears to be doing a little better in regard to his left lateral lower extremity ulcer. He states that overall he does not feel like he is doing as well  with the Dapsone as he did with the prednisone. Nonetheless he sees his dermatologist later today and is gonna talk to them about the possibility of going back on the prednisone. Overall again I believe that the wound would be better if you would utilize Santyl but he really does not seem to be interested in going back to the [place] at this point. He has been using triple antibiotic ointment. SANAD, FEARNOW ([id number]) 06/15/18 on evaluation today patient's wound actually appears to be doing about the same at this point. Fortunately there is no signs of infection at this time. He has made slight improvements although he continues to not really want to clean the wound bed at this point. He states that he just doesn't mess with it he doesn't want to cause any problems with everything else he has going on. He has been on medication, antibiotics as prescribed by his dermatologist, for a staff infection of his lower extremities which is really drying out now and looking much better he tells me. Fortunately there is no sign of overall infection. 06/29/18 on evaluation today patient appears to be doing well in regard to his left lateral lower surety ulcer all things considering. Fortunately his staff infection seems to be greatly improved compared to previous. He has no signs of infection and this is drying up quite nicely. He is still the doxycycline for this is no longer on cental, Dapsone, or any of the other medications. His dermatologist has recommended possibility of an infusion but right now he does not want to proceed with that. Patient History Information obtained from Patient. Family History Diabetes - Mother,Maternal Grandparents, Heart Disease - Mother,Maternal Grandparents, Hereditary Spherocytosis - Siblings, Hypertension - Maternal Grandparents, Stroke - Siblings, No family history of Cancer, Kidney Disease, Lung Disease, Seizures, Thyroid Problems, Tuberculosis. Social History Current  some day smoker, Marital Status - Married, Alcohol Use - Rarely, Drug Use - No History, Caffeine Use - Moderate. Medical History Eyes Denies history of Cataracts, Glaucoma, Optic Neuritis Ear/Nose/Mouth/Throat Denies history of Chronic sinus problems/congestion, Middle ear problems Hematologic/Lymphatic Denies history of Anemia, Hemophilia, Human Immunodeficiency Virus, Lymphedema, Sickle Cell Disease Respiratory Patient has history of Sleep Apnea - cpap Denies history of Aspiration, Asthma, Chronic Obstructive Pulmonary Disease (COPD), Pneumothorax Cardiovascular Patient has history of Hypertension Denies history of Angina, Arrhythmia, Congestive Heart Failure, Coronary Artery Disease, Deep Vein Thrombosis, Hypotension, Myocardial Infarction, Peripheral Arterial Disease, Peripheral Venous Disease, Phlebitis, Vasculitis Gastrointestinal Patient has history of Colitis Endocrine Denies history of Type I Diabetes, Type II Diabetes Genitourinary Denies history of End Stage Renal Disease Immunological Denies history of Lupus Erythematosus, Raynaud s, Scleroderma Integumentary (Skin) Denies history of History of Burn, History of pressure wounds Musculoskeletal Denies history of Gout, Rheumatoid Arthritis, Osteoarthritis, Osteomyelitis Neurologic Denies history of Dementia, Neuropathy, Quadriplegia, Paraplegia, Seizure Disorder Oncologic Denies history of Received Chemotherapy, Received Radiation Psychiatric Denies history of Anorexia/bulimia, Confinement Anxiety Review of Systems (ROS) Constitutional Symptoms (General Health) Denies complaints or symptoms of Fever, Chills. COLBIN, JOVEL ([id number]) Respiratory The patient has no complaints or symptoms. Cardiovascular The patient has no complaints or symptoms. Psychiatric The patient has no complaints or symptoms. Objective Constitutional Well-nourished and well-hydrated in no acute distress. Vitals Time Taken: 8:13 AM, Height:  71 in, Weight: 338 lbs, BMI: 47.1, Temperature: 98.4 F, Pulse: 77 bpm, Respiratory Rate: 18 breaths/min, Blood Pressure: 133/69 mmHg. Respiratory normal breathing without difficulty. clear to auscultation bilaterally. Cardiovascular regular rate and rhythm with normal S1, S2. Psychiatric this patient is able to make decisions and demonstrates good insight into disease process. Alert and Oriented x 3. pleasant and cooperative. General Notes: Patient's wound bed currently shows signs of good granulation at this time underneath the slough which was able to be gently cleansed off with saline and gauze today. Post debridement the wound bed appears to be doing significantly better which is great news. Fortunately there is no sign of infection at this point. Integumentary (Hair, Skin) Wound #1 status is Open. Original cause of wound was Gradually Appeared. The wound is located on the Left,Lateral Lower Leg. The wound measures 3cm length x 4cm width x 0.2cm depth; 9.425cm^2 area and 1.885cm^3 volume. There is Fat Layer (Subcutaneous Tissue) Exposed exposed. There is no tunneling or undermining noted. There is a medium amount of purulent drainage noted. The wound margin is distinct with the outline attached to the wound base. There is no granulation within the wound bed. There is a large (67-100%) amount of necrotic tissue within the wound bed including Adherent Slough. The periwound skin appearance exhibited: Induration, Scarring, Dry/Scaly, Hemosiderin Staining, Erythema. The periwound skin appearance did not exhibit: Callus, Crepitus, Excoriation, Rash, Maceration, Atrophie Blanche, Cyanosis, Ecchymosis, Mottled, Pallor, Rubor. The surrounding wound skin color is noted with erythema. Periwound temperature was noted as No Abnormality. The periwound has tenderness on palpation. 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 Anesthetic (add to Medication List): Wound #1 Left,Lateral Lower Leg: Topical Lidocaine 4% cream applied to wound bed prior to debridement (In Clinic Only). Primary Wound Dressing: Wound #1 Left,Lateral Lower Leg: Other: - Triple antibiotic ointment Secondary Dressing: Wound #1 Left,Lateral Lower Leg: Boardered Foam Dressing Dressing Change Frequency: Wound #1 Left,Lateral Lower Leg: Change dressing every day. Follow-up Appointments: Wound #1 Left,Lateral Lower Leg: Return Appointment in 2 weeks. Edema Control: Wound #1 Left,Lateral Lower Leg: Patient to wear own compression stockings Elevate legs to the level of the heart and pump ankles as often as possible Medications-please add to medication list.: Wound #1 Left,Lateral Lower Leg: P.O. Antibiotics - Prescribed by Dermatology for Staph Infection Other: - Continue steroids My suggestion at this point is gonna be that we go ahead and continue with the above wound care measures for the next week. Patient is in agreement with plan. We will subsequently see were things stand at follow-up. If anything changes or worsens meantime he will contact the office and let me know. Please see above for specific wound care orders. We will see patient for re-evaluation in 1 week(s) here in the clinic. If anything worsens or changes patient will contact our office for additional recommendations. Electronic Signature(s) Signed: 06/30/2018 5:08:21 PM By: Worthy Keeler PA-C Entered By: Worthy Keeler on 06/29/2018 08:52:20 LAVONE, WEISEL ([id number]) -------------------------------------------------------------------------------- ROS/PFSH Details Patient Name: [name] Date of Service: 06/29/2018 8:00 AM Medical Record Number: [id number] Patient  Account Number: [id number] Date of Birth/Sex: 1981-01-25 (38 y.o. M) Treating RN: [name] Primary Care Provider: [name] Other Clinician: Referring Provider: [name] Treating Provider/Extender: Melburn Hake, Jermaine Neuharth Weeks in Treatment: 58 Information Obtained  From Patient Wound History Do you currently have one or more open woundso Yes How many open wounds do you currently haveo 1 Approximately how long have you had your woundso 1 year How have you been treating your wound(s) until nowo neosporin Has your wound(s) ever healed and then re-openedo No Have you had any lab work done in the past montho No Have you tested positive for an antibiotic resistant organism (MRSA, VRE)o No Have you tested positive for osteomyelitis (bone infection)o No Have you had any tests for circulation on your legso No Have you had other problems associated with your woundso Infection, Swelling Constitutional Symptoms (General Health) Complaints and Symptoms: Negative for: Fever; Chills Eyes Medical History: Negative for: Cataracts; Glaucoma; Optic Neuritis Ear/Nose/Mouth/Throat Medical History: Negative for: Chronic sinus problems/congestion; Middle ear problems Hematologic/Lymphatic Medical History: Negative for: Anemia; Hemophilia; Human Immunodeficiency Virus; Lymphedema; Sickle Cell Disease Respiratory Complaints and Symptoms: No Complaints or Symptoms Medical History: Positive for: Sleep Apnea - cpap Negative for: Aspiration; Asthma; Chronic Obstructive Pulmonary Disease (COPD); Pneumothorax Cardiovascular Complaints and Symptoms: No Complaints or Symptoms Medical HistoryGARET, HOOTON ([id number]) Positive for: Hypertension Negative for: Angina; Arrhythmia; Congestive Heart Failure; Coronary Artery Disease; Deep Vein Thrombosis; Hypotension; Myocardial Infarction; Peripheral Arterial Disease; Peripheral Venous Disease; Phlebitis; Vasculitis Gastrointestinal Medical  History: Positive for: Colitis Endocrine Medical History: Negative for: Type I Diabetes; Type II Diabetes Genitourinary Medical History: Negative for: End Stage Renal Disease Immunological Medical History: Negative for: Lupus Erythematosus; Raynaudos; Scleroderma Integumentary (Skin) Medical History: Negative for: History of Burn; History of pressure wounds Musculoskeletal Medical History: Negative for: Gout; Rheumatoid Arthritis; Osteoarthritis; Osteomyelitis Neurologic Medical History: Negative for: Dementia; Neuropathy; Quadriplegia; Paraplegia; Seizure Disorder Oncologic Medical History: Negative for: Received Chemotherapy; Received Radiation Psychiatric Complaints and Symptoms: No Complaints or Symptoms Medical History: Negative for: Anorexia/bulimia; Confinement Anxiety Immunizations Pneumococcal Vaccine: Received Pneumococcal Vaccination: No Implantable Devices Family and Social History WAYMAN, HOARD ([id number]) Cancer: No; Diabetes: Yes - Mother,Maternal Grandparents; Heart Disease: Yes - Mother,Maternal Grandparents; Hereditary Spherocytosis: Yes - Siblings; Hypertension: Yes - Maternal Grandparents; Kidney Disease: No; Lung Disease: No; Seizures: No; Stroke: Yes - Siblings; Thyroid Problems: No; Tuberculosis: No; Current some day smoker; Marital Status - Married; Alcohol Use: Rarely; Drug Use: No History; Caffeine Use: Moderate; Financial Concerns: No; Food, Clothing or Shelter Needs: No; Support System Lacking: No; Transportation Concerns: No; Advanced Directives: No; Patient does not want information on Advanced Directives; Living Will: No Physician Affirmation I have reviewed and agree with the above information. Electronic Signature(s) Signed: 06/30/2018 11:53:49 AM By: [name], [name], RN, [name], [name], [name] Signed: 06/30/2018 5:08:21 PM By: Worthy Keeler PA-C Entered By: Worthy Keeler on 06/29/2018 08:51:45 CARMON, SAHLI  ([id number]) -------------------------------------------------------------------------------- SuperBill Details Patient Name: [name] Date of Service: 06/29/2018 Medical Record Number: [id number] Patient Account Number: [id number] Date of Birth/Sex: Nov 07, 1980 (38 y.o. M) Treating RN: [name] Primary Care Provider: [name] Other Clinician: Referring Provider: [name] Treating Provider/Extender: Melburn Hake, Canuto [place] Weeks in Treatment: 83 Diagnosis Coding ICD-10 Codes Code Description [phone] Non-pressure chronic ulcer of left calf with fat layer exposed L88 Pyoderma gangrenosum I87.2 Venous insufficiency (chronic) (peripheral) Facility Procedures CPT4 Code: [id number] Description: 99213 - WOUND CARE VISIT-LEV 3 EST PT Modifier: Quantity: 1 Physician Procedures CPT4 Code: [id number] Description: 09628 - WC PHYS LEVEL 4 - EST PT ICD-10 Diagnosis Description L97.222 Non-pressure chronic ulcer of left calf with fat layer expo L88 Pyoderma gangrenosum I87.2 Venous insufficiency (chronic) (peripheral) Modifier: sed Quantity: 1 Electronic Signature(s) Signed: 06/30/2018 5:08:21  PM By: Worthy Keeler PA-C Entered By: Worthy Keeler on 06/29/2018 08:53:50

## 2018-07-01 NOTE — Progress Notes (Signed)
MATTSON, DAYAL ([id number]) Visit Report for 06/29/2018 Arrival Information Details Patient Name: JOSHU, FURUKAWA Date of Service: 06/29/2018 8:00 AM Medical Record Number: [id number] Patient Account Number: [id number] Date of Birth/Sex: Jun 10, 1980 (37 y.o. M) Treating RN: [name] Primary Care Masayo Fera: [name] Other Clinician: Referring Floy Riegler: [name] Treating Jayleene Glaeser/Extender: Melburn Hake, [name]: 35 Visit Information History Since Last Visit Added or deleted any medications: Yes Patient Arrived: Ambulatory Any new allergies or adverse reactions: No Arrival Time: 08:07 Had a fall or experienced change in No Accompanied By: self activities of daily living that may affect Transfer Assistance: None risk of falls: Patient Identification Verified: Yes Signs or symptoms of abuse/neglect since last visito No Secondary Verification Process Completed: Yes Hospitalized since last visit: No Patient Requires Transmission-Based No Implantable device outside of the clinic excluding No Precautions: cellular tissue based products placed in the center Patient Has Alerts: No since last visit: Has Dressing in Place as Prescribed: Yes Pain Present Now: Yes Electronic Signature(s) Signed: 06/29/2018 4:24:19 PM By: Lorine Bears RCP, RRT, CHT Entered By: Lorine Bears on 06/29/2018 08:10:38 [name] ([id number]) -------------------------------------------------------------------------------- Clinic Level of Care Assessment Details Patient Name: [name] Date of Service: 06/29/2018 8:00 AM Medical Record Number: [id number] Patient Account Number: [id number] Date of Birth/Sex: 1981-05-20 (37 y.o. M) Treating RN: [name] Primary Care Alishia Lebo: [name] Other Clinician: Referring Virgle Arth: [name] Treating Eulia Hatcher/Extender: Melburn Hake, [name]: 48 Clinic Level of Care Assessment Items TOOL 4  Quantity Score []  - Use when only an EandM is performed on FOLLOW-UP visit 0 ASSESSMENTS - Nursing Assessment / Reassessment []  - Reassessment of Co-morbidities (includes updates in patient status) 0 X- 1 5 Reassessment of Adherence to Treatment Plan ASSESSMENTS - Wound and Skin Assessment / Reassessment X - Simple Wound Assessment / Reassessment - one wound 1 5 []  - 0 Complex Wound Assessment / Reassessment - multiple wounds []  - 0 Dermatologic / Skin Assessment (not related to wound area) ASSESSMENTS - Focused Assessment []  - Circumferential Edema Measurements - multi extremities 0 []  - 0 Nutritional Assessment / Counseling / Intervention []  - 0 Lower Extremity Assessment (monofilament, tuning fork, pulses) []  - 0 Peripheral Arterial Disease Assessment (using hand held doppler) ASSESSMENTS - Ostomy and/or Continence Assessment and Care []  - Incontinence Assessment and Management 0 []  - 0 Ostomy Care Assessment and Management (repouching, etc.) PROCESS - Coordination of Care X - Simple Patient / Family Education for ongoing care 1 15 []  - 0 Complex (extensive) Patient / Family Education for ongoing care X- 1 10 Staff obtains Programmer, systems, Records, Test Results / Process Orders []  - 0 Staff telephones HHA, Nursing Homes / Clarify orders / etc []  - 0 Routine Transfer to another Facility (non-emergent condition) []  - 0 Routine Hospital Admission (non-emergent condition) []  - 0 New Admissions / Biomedical engineer / Ordering NPWT, Apligraf, etc. []  - 0 Emergency Hospital Admission (emergent condition) X- 1 10 Simple Discharge Coordination Erber, Kazimir ([id number]) []  - 0 Complex (extensive) Discharge Coordination PROCESS - Special Needs []  - Pediatric / Minor Patient Management 0 []  - 0 Isolation Patient Management []  - 0 Hearing / Language / Visual special needs []  - 0 Assessment of Community assistance (transportation, D/C planning, etc.) []  - 0 Additional  assistance / Altered mentation []  - 0 Support Surface(s) Assessment (bed, cushion, seat, etc.) INTERVENTIONS - Wound Cleansing / Measurement X - Simple Wound Cleansing - one wound 1 5 []  - 0 Complex Wound Cleansing -  multiple wounds X- 1 5 Wound Imaging (photographs - any number of wounds) []  - 0 Wound Tracing (instead of photographs) X- 1 5 Simple Wound Measurement - one wound []  - 0 Complex Wound Measurement - multiple wounds INTERVENTIONS - Wound Dressings []  - Small Wound Dressing one or multiple wounds 0 X- 1 15 Medium Wound Dressing one or multiple wounds []  - 0 Large Wound Dressing one or multiple wounds []  - 0 Application of Medications - topical []  - 0 Application of Medications - injection INTERVENTIONS - Miscellaneous []  - External ear exam 0 []  - 0 Specimen Collection (cultures, biopsies, blood, body fluids, etc.) []  - 0 Specimen(s) / Culture(s) sent or taken to Lab for analysis []  - 0 Patient Transfer (multiple staff / Civil Service fast streamer / Similar devices) []  - 0 Simple Staple / Suture removal (25 or less) []  - 0 Complex Staple / Suture removal (26 or more) []  - 0 Hypo / Hyperglycemic Management (close monitor of Blood Glucose) []  - 0 Ankle / Brachial Index (ABI) - do not check if billed separately X- 1 5 Vital Signs Homer, Shalin ([id number]) Has the patient been seen at the hospital within the last three years: Yes Total Score: 80 Level Of Care: New/Established - Level 3 Electronic Signature(s) Signed: 06/30/2018 11:53:49 AM By: [name], [name], RN, [name], [name], [name] Entered By: [name], [name], RN, [name], [name] on 06/29/2018 08:48:21 [name] ([id number]) -------------------------------------------------------------------------------- Encounter Discharge Information Details Patient Name: [name] Date of Service: 06/29/2018 8:00 AM Medical Record Number: [id number] Patient Account Number: [id number] Date of Birth/Sex: 1980-07-09 (37 y.o. M) Treating RN: [name] Primary Care Gelila Well: [name] Other Clinician: Referring Celedonio Sortino: [name] Treating Kegan Shepardson/Extender: Melburn Hake, [name]: 31 Encounter Discharge Information Items Discharge Condition: Stable Ambulatory Status: Ambulatory Discharge Destination: Home Transportation: Private Auto Accompanied By: self Schedule Follow-up Appointment: Yes Clinical Summary of Care: Electronic Signature(s) Signed: 06/30/2018 11:53:49 AM By: [name], [name], RN, [name], [name], [name] Entered By: [name], [name], RN, [name], [name] on 06/29/2018 08:50:32 [name] ([id number]) -------------------------------------------------------------------------------- Lower Extremity Assessment Details Patient Name: [name] Date of Service: 06/29/2018 8:00 AM Medical Record Number: [id number] Patient Account Number: [id number] Date of Birth/Sex: [birth year] (37 y.o. M) Treating RN: Army Melia Primary Care Wilberto Console: [name] Other Clinician: Referring Roylee Chaffin: [name] Treating Adel Burch/Extender: Melburn Hake, [name]: 10 Edema Assessment Assessed: [Left: No] [Right: No] Edema: [Left: Ye] [Right: s] Calf Left: Right: Point of Measurement: 30 cm From Medial Instep 52 cm cm Ankle Left: Right: Point of Measurement: 12 cm From Medial Instep 31 cm cm Vascular Assessment Pulses: Dorsalis Pedis Palpable: [Left:Yes] Posterior Tibial Extremity colors, hair growth, and conditions: Extremity Color: [Left:Hyperpigmented] Hair Growth on Extremity: [Left:Yes] Temperature of Extremity: [Left:Warm] Capillary Refill: [Left:< 3 seconds] Toe Nail Assessment Left: Right: Thick: Yes Discolored: No Deformed: No Improper Length and Hygiene: No Electronic Signature(s) Signed: 06/29/2018 4:51:21 PM By: Army Melia Entered By: Army Melia on 06/29/2018 08:24:11 Tabbert, [name]  ([id number]) -------------------------------------------------------------------------------- Multi Wound Chart Details Patient Name: [name] Date of Service: 06/29/2018 8:00 AM Medical Record Number: [id number] Patient Account Number: [id number] Date of Birth/Sex: 1980-07-24 (37 y.o. 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] [N/A:N/A] Comorbid History: [1:Sleep Apnea, Hypertension, Colitis] [N/A:N/A] Date Acquired: [1:11/18/2015] [N/A:N/A] Weeks of Treatment: [1:81] [N/A:N/A] Wound Status: [1:Open] [N/A:N/A] Measurements L x W x D [1:3x4x0.2] [N/A:N/A] (cm) Area (cm) : [1:9.425] [N/A:N/A] Volume (cm) : [1:1.885] [N/A:N/A] % Reduction in Area: [1:-92.00%] [N/A:N/A] % Reduction in Volume: [1:52.00%] [N/A:N/A] Classification: [1:Full Thickness With Exposed Support Structures] [N/A:N/A] Exudate Amount: [1:Medium] [N/A:N/A] Exudate Type: [1:Purulent] [N/A:N/A] Exudate Color: [1:yellow, brown, green] [N/A:N/A] Wound Margin: [1:Distinct, outline attached] [N/A:N/A] Granulation Amount: [1:None Present (0%)] [N/A:N/A] Necrotic Amount: [1:Large (67-100%)] [N/A:N/A] Exposed Structures: [1:Fat Layer (Subcutaneous Tissue) Exposed: Yes Fascia: No Tendon: No Muscle: No Joint: No Bone: No] [N/A:N/A] Epithelialization: [1:Small (1-33%)] [N/A:N/A] Periwound Skin Texture: [1:Induration: Yes Scarring: Yes Excoriation: No Callus: No Crepitus: No Rash: No] [N/A:N/A] Periwound Skin Moisture: Dry/Scaly: Yes N/A  N/A Maceration: No Periwound Skin Color: Erythema: Yes N/A N/A Hemosiderin Staining: Yes Atrophie Blanche: No Cyanosis: No Ecchymosis: No Mottled: No Pallor: No Rubor: No Temperature: No Abnormality N/A N/A Tenderness on Palpation: Yes N/A N/A Wound Preparation: Ulcer Cleansing: N/A N/A Rinsed/Irrigated with Saline Topical Anesthetic Applied: Other: lidocaine 4% Treatment Notes Electronic Signature(s) Signed: 06/30/2018 11:53:49 AM By: [name], [name], RN, [name], [name], [name] Entered By: [name], [name], RN, [name], [name] on 06/29/2018 08:47:40 TYAIRE, ODEM ([id number]) -------------------------------------------------------------------------------- Multi-Disciplinary Care Plan Details Patient Name: [name] Date of Service: 06/29/2018 8:00 AM Medical Record Number: [id number] Patient Account Number: [id number] Date of Birth/Sex: 11-Nov-1980 (37 y.o. M) Treating RN: [name] Primary Care Mishal Probert: [name] Other Clinician: Referring Stanley Lyness: [name] Treating Jonmichael Beadnell/Extender: Melburn Hake, [name]: 33 Active Inactive Orientation to the Wound Care Program Nursing Diagnoses: Knowledge deficit related to the wound healing center program Goals: Patient/caregiver will verbalize understanding of the [facility] Date Initiated: 12/11/2016 Target Resolution Date: 03/13/2017 Goal Status: Active Interventions: Provide education on orientation to the wound center Notes: Venous Leg Ulcer Nursing Diagnoses: Knowledge deficit related to disease process and management Potential for venous Insuffiency (use before diagnosis confirmed) Goals: Non-invasive venous studies are completed as ordered Date Initiated: 12/11/2016 Target Resolution Date: 03/13/2017 Goal Status: Active Patient will maintain optimal edema control Date Initiated: 12/11/2016 Target Resolution Date: 03/13/2017 Goal Status: Active Patient/caregiver will verbalize understanding of  disease process and disease management Date Initiated: 12/11/2016 Target Resolution Date: 03/13/2017 Goal Status: Active Verify adequate tissue perfusion prior to therapeutic compression application Date Initiated: 12/11/2016 Target Resolution Date: 03/13/2017 Goal Status: Active Interventions: Assess peripheral edema status every visit. Compression as ordered Provide education on venous insufficiency Treatment Activities: MERRIT, FRIESEN ([id number]) Therapeutic compression applied : 12/11/2016 Notes: Wound/Skin Impairment Nursing Diagnoses: Impaired tissue integrity Knowledge deficit related to ulceration/compromised skin integrity Goals: Patient/caregiver will verbalize understanding of skin care regimen Date Initiated: 12/11/2016 Target Resolution Date: 03/13/2017 Goal Status: Active Ulcer/skin breakdown will have a volume reduction of 30% by week 4 Date Initiated: 12/11/2016 Target Resolution Date: 03/13/2017 Goal Status: Active Ulcer/skin breakdown will have a volume reduction of 50% by week 8 Date Initiated: 12/11/2016 Target Resolution Date: 03/13/2017 Goal Status: Active Ulcer/skin breakdown will have a volume reduction of 80% by week 12 Date Initiated: 12/11/2016 Target Resolution Date: 03/13/2017 Goal Status: Active Ulcer/skin breakdown will heal within 14 weeks Date Initiated: 12/11/2016 Target Resolution Date: 03/13/2017 Goal Status: Active Interventions: Assess patient/caregiver ability to obtain necessary supplies Assess patient/caregiver ability to perform ulcer/skin care regimen upon admission and as needed Assess ulceration(s) every visit Provide education on ulcer and skin care Treatment  Activities: Skin care regimen initiated : 12/11/2016 Topical wound management initiated : 12/11/2016 Notes: Electronic Signature(s) Signed: 06/30/2018 11:53:49 AM By: [name], [name], RN, [name], [name], [name] Entered By: [name], [name], RN, [name], [name] on 06/29/2018 08:47:33 [name]  ([id number]) -------------------------------------------------------------------------------- Pain Assessment Details Patient Name: [name] Date of Service: 06/29/2018 8:00 AM Medical Record Number: [id number] Patient Account Number: [id number] Date of Birth/Sex: July 01, 1980 (37 y.o. M) Treating RN: [name] Primary Care Raylea Adcox: [name] Other Clinician: Referring Ellan Tess: [name] Treating Neshawn Aird/Extender: Melburn Hake, [name]: 77 Active Problems Location of Pain Severity and Description of Pain Patient Has Paino Yes Site Locations Rate the pain. Current Pain Level: 3 Pain Management and Medication Current Pain Management: Electronic Signature(s) Signed: 06/29/2018 4:24:19 PM By: Lorine Bears RCP, RRT, CHT Signed: 06/30/2018 11:53:49 AM By: [name], [name], RN, [name], [name], [name] Entered By: Lorine Bears on 06/29/2018 08:10:51 MARSHEL, GOLUBSKI ([id number]) -------------------------------------------------------------------------------- Patient/Caregiver Education Details Patient Name: [name] Date of Service: 06/29/2018 8:00 AM Medical Record Number: [id number] Patient Account Number: [id number] Date of Birth/Gender: 12/16/1980 (37 y.o. M) Treating RN: [name] Primary Care Physician: [name] Other Clinician: Referring Physician: [name] Treating Physician/Extender: Sharalyn Ink in Treatment: 39 Education Assessment Education Provided To: Patient Education Topics Provided Wound/Skin Impairment: Handouts: Caring for Your Ulcer Methods: Demonstration, Explain/Verbal Responses: State content correctly Electronic Signature(s) Signed: 06/30/2018 11:53:49 AM By: [name], [name], RN, [name], [name], [name] Entered By: [name], [name], RN, [name], [name] on 06/29/2018 08:50:43 [name] ([id number]) -------------------------------------------------------------------------------- Wound Assessment  Details Patient Name: [name] Date of Service: 06/29/2018 8:00 AM Medical Record Number: [id number] Patient Account Number: [id number] Date of Birth/Sex: [birth year] (37 y.o. M) Treating RN: [name] Primary Care Desiree Daise: [name] Other Clinician: Referring Beadie Matsunaga: [name] Treating Anne-Marie Genson/Extender: Melburn Hake, [name]: 15 Wound Status Wound Number: 1 Primary Etiology: Pyoderma Wound Location: Left, Lateral Lower Leg Wound Status: Open Wounding Event: Gradually Appeared Comorbid History: Sleep Apnea, Hypertension, Colitis Date Acquired: 11/18/2015 Weeks Of Treatment: 81 Clustered Wound: No Photos Photo Uploaded By: Army Melia on 06/29/2018 12:40:31 Wound Measurements Length: (cm) 3 Width: (cm) 4 Depth: (cm) 0.2 Area: (cm) 9.425 Volume: (cm) 1.885 % Reduction in Area: -92% % Reduction in Volume: 52% Epithelialization: Small (1-33%) Tunneling: No Undermining: No Wound Description Full Thickness With Exposed Support Foul Odo Classification: Structures Slough/F Wound Margin: Distinct, outline attached Exudate Medium Amount: Exudate Type: Purulent Exudate Color: yellow, brown, green r After Cleansing: No ibrino Yes Wound Bed Granulation Amount: None Present (0%) Exposed Structure Necrotic Amount: Large (67-100%) Fascia Exposed: No Necrotic Quality: Adherent Slough Fat Layer (Subcutaneous Tissue) Exposed: Yes Tendon Exposed: No Muscle Exposed: No Joint Exposed: No Bone Exposed: No Schobert, Shlomo ([id number]) Periwound Skin Texture Texture Color No Abnormalities Noted: No No Abnormalities Noted: No Callus: No Atrophie Blanche: No Crepitus: No Cyanosis: No Excoriation: No Ecchymosis: No Induration: Yes Erythema: Yes Rash: No Hemosiderin Staining: Yes Scarring: Yes Mottled: No Pallor: No Moisture Rubor: No No Abnormalities Noted: No Dry / Scaly: Yes Temperature / Pain Maceration: No Temperature: No  Abnormality Tenderness on Palpation: Yes Wound Preparation Ulcer Cleansing: Rinsed/Irrigated with Saline Topical Anesthetic Applied: Other: lidocaine 4%, Treatment Notes Wound #1 (Left, Lateral Lower Leg) Notes Triple antibiotic, BFD and patient's own compression Electronic Signature(s) Signed: 06/30/2018 11:53:49 AM By: [name], [name], RN, [name], [name], [name] Signed: 06/30/2018 5:08:21 PM By: Worthy Keeler PA-C Entered By: Worthy Keeler on 06/29/2018 08:43:30 Unrein, [name] (  [id number]) -------------------------------------------------------------------------------- Vitals Details Patient Name: RADEN, BYINGTON Date of Service: 06/29/2018 8:00 AM Medical Record Number: [id number] Patient Account Number: [id number] Date of Birth/Sex: Jul 09, 1980 (37 y.o. M) Treating RN: [name] Primary Care Chandelle Harkey: [name] Other Clinician: Referring Ulises Wolfinger: [name] Treating Sundeep Destin/Extender: Melburn Hake, [name]: 45 Vital Signs Time Taken: 08:13 Temperature (F): 98.4 Height (in): 71 Pulse (bpm): 77 Weight (lbs): 338 Respiratory Rate (breaths/min): 18 Body Mass Index (BMI): 47.1 Blood Pressure (mmHg): 133/69 Reference Range: 80 - 120 mg / dl Airway Electronic Signature(s) Signed: 06/29/2018 4:24:19 PM By: Lorine Bears RCP, RRT, CHT Entered By: Lorine Bears on 06/29/2018 08:16:59

## 2018-07-08 ENCOUNTER — Ambulatory Visit
Admit: 2018-07-08 | Discharge: 2018-07-09 | Payer: PRIVATE HEALTH INSURANCE | Attending: Dermatology | Primary: Dermatology

## 2018-07-08 DIAGNOSIS — L98499 Non-pressure chronic ulcer of skin of other sites with unspecified severity: Secondary | ICD-10-CM

## 2018-07-08 DIAGNOSIS — L88 Pyoderma gangrenosum: Secondary | ICD-10-CM

## 2018-07-08 DIAGNOSIS — L980 Pyogenic granuloma: Principal | ICD-10-CM

## 2018-07-08 DIAGNOSIS — B9561 Methicillin susceptible Staphylococcus aureus infection as the cause of diseases classified elsewhere: Secondary | ICD-10-CM | POA: Diagnosis not present

## 2018-07-08 MED ORDER — DAPSONE 7.5 % TOPICAL GEL WITH PUMP
Freq: Two times a day (BID) | TOPICAL | 0 refills | 0 days | Status: CP
Start: 2018-07-08 — End: ?

## 2018-07-08 NOTE — Unmapped (Signed)
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 Aerobic swab at [facility] with 3+ MSSA  -new aerobic culture today  - cont doxycycline 100mg  BID with food and a full glass of water - discussed risks of stomach upset, sun sensitivity and erosive esophagitis.  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 LL ulcer healing but concern for superficial pyoderma vs traumatic superinfected crusted erosions to R leg. Smoking hx and obesity likely contributing. Hx of UC also likely related.   - Currently off of long-term high-dose prednisone use that was complicated by development of cushingoid features and development of diabetes mellitus; stable while currently on dapsone  - Humira and Remicade previously ordered and denied by insurance, working on peer to peer with Remicade  - Continue follow-up with wound care  - Continue wearing compression stockings  -start hibiclens  -start dapsone gel  -cont hydrocortisone per patient preference  -avoid clobetasol given that he develops cellulitis easily under his compression  -consider clinda/rifampin  -consider aspirin or pentoxyfilline  -cont regular wound care visits  - ILK today to R superior shin (pt reports previous improvement with ILK in the past)  After the patient was informed of risks, benefits and side effects of intralesional steroid injection, the patient elected to undergo injection. Informed verbal consent was obtained. Risk of atrophy  and dyspigmentation with injection was explained. Kenalog 10 mg/ml was injected locally into the sites located R ant shin in a clean fashion following alcohol prep.   Total volume in ml=0.2.  Number of sites treated: 2   Wound care was explained to the patient    Dark stools in the setting of prior diagnosis of ulcerative colitis  - Referral previously sent to gastroenterology      RTC: 2 weeks     SUBJECTIVE    CC: rash    HPI:  This is a pleasant 38 y.o. male with a history of pyoderma gangrenosum and MRSA infection who presents today for f/up.  Has stopped his dapsone, doxepin, and gabapentin. Notes that his left leg is healing but his right leg has new crusted areas and some drainage although improved from prior. Using compression. Stopped smoking once month ago.    Disease history:  -Starting about 2013, he started to develop itchy bumps that enlarge when he scratches and picks at them. This initially affected just his lower legs, but more recently prior to initial presentation to our clinic, involvement spread to his thighs, buttocks, arms, and forearms.  -Areas start as tiny bumps that he then scratches. 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   -In spring 2018, an ulcer started on the L lateral calf and gradually enlarged, but was slowly healing and shrinking by the time of presentation to our clinic. Denies any trauma at this site and notes that it started similar to other rashes on skin. Prior treatments include oral abx, including as recently as 12/2016 with courses of clinda and doxy, as well as topical steroids. He has h/o ulcerative colitis, now in remission without meds since 2015. Denies h/o liver or kidney disease or DM. Used to live in [place] when this started.  Works for [employer] and is on feet all day, endorses swelling of legs. Denies new meds prior to onset.  - At his initial visit 01/22/2017, clobetasol ointment and doxycycline 100mg  bid x 4 weeks were started. A biopsy of a keratotic pruritic plaque was obtained. 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 Tissue culture grew 4+ MSSA. 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   - w/up for underlying pruritus (given patient report and difficulty not picking at the involved areas) on 10/17 revealed unremarkable HIV, hepatitis panel, LDH, TSH, free T4 . Outside slides from previous bx were reviewed by Dr. Eyvonne Left and showed venous stasis changes as well as a neutrophilic infiltrate that could be consistent with PG. He was started on topical clobetasol BID. He was continued on PO doxycycline for additional 6 weeks. His left leg ulcer continued to grow and the site of his previous biopsy on the posterior right leg has also developed a large non healing ulcer.   -10/18: w/up for underlying etiology of PG negative on 10/18: negative spep/upep, cbc w/diff consistently normal, and reports a known hx of ulcerative colitis which is in remssion  - Repeat biopsy as below done on 03/26/17 primarily c/w PG vs infectious with one potential organism on Fite stain but unclear if related.   - Patient started on prednisone on 04/01/17 with almost resolution by 7/19.  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 Stopped doxepin and gabapentin given lack of improvement    Final Diagnosis   Date Value Ref Range Status   03/26/2017   Final    Left lateral shin, punch  - Suppurative inflammation with sinus tract formation, debris, and reactive changes. See comment.          Pertinent PMH:   Ulcerative colitis, in remission per patient  HTN  DM   Denies h/o liver, kidney abnormalities     ROS: A balance of 10 systems was reviewed and negative.     PE:  General: Well-developed, well-nourished male in no acute distress, resting comfortably.  Neuro: Alert and oriented, answers questions appropriately.  Skin: Examination of face, neck, bilateral lower extremities, chest, bilateral arms and hands was notable for:  - Right lower leg with non-pitting edema and multiple crusted erosions on the posterior, lateral and anterior lower leg with mild drainage and surrounding erythema and warmth. Scaling of the right posterior calf. Few excoriations noted on the right lower leg.      Injected this superior crused plaque with ILK    L leg with ulcer w/o surrounding violaceous color, ulcer is thinning  -All other areas examined were normal or had no significant findings.

## 2018-07-13 ENCOUNTER — Encounter: Payer: 59 | Admitting: Physician Assistant

## 2018-07-13 DIAGNOSIS — L97822 Non-pressure chronic ulcer of other part of left lower leg with fat layer exposed: Secondary | ICD-10-CM | POA: Diagnosis not present

## 2018-07-13 DIAGNOSIS — L88 Pyoderma gangrenosum: Secondary | ICD-10-CM | POA: Diagnosis not present

## 2018-07-13 DIAGNOSIS — L539 Erythematous condition, unspecified: Secondary | ICD-10-CM | POA: Diagnosis not present

## 2018-07-13 DIAGNOSIS — E11622 Type 2 diabetes mellitus with other skin ulcer: Secondary | ICD-10-CM | POA: Diagnosis not present

## 2018-07-14 MED ORDER — DOXYCYCLINE HYCLATE 100 MG TABLET/CAPSULE WRAPPER
Freq: Two times a day (BID) | ORAL | 2 refills | 0.00000 days | Status: CP
Start: 2018-07-14 — End: 2018-10-09

## 2018-07-14 NOTE — Unmapped (Signed)
Pt notified of culture results - will cont doxycyline for now. Will plan on ILK in future.

## 2018-07-14 NOTE — Progress Notes (Signed)
[name], [name] ([id number]) Visit Report for 07/13/2018 Chief Complaint Document Details Patient Name: [name], [name] Date of Service: 07/13/2018 8:00 AM Medical Record Number: [id number] Patient Account Number: [id number] Date of Birth/Sex: 10/18/1980 (37 y.o. M) Treating RN: Harold Barban Primary Care Provider: [name] Other Clinician: Referring Provider: [name] Treating Provider/Extender: Melburn Hake, Murle Otting Weeks in Treatment: 49 Information Obtained from: Patient Chief Complaint He is here in follow up evaluation for LLE pyoderma ulcer Electronic Signature(s) Signed: 07/14/2018 8:52:26 AM By: Worthy Keeler PA-C Entered By: Worthy Keeler on 07/13/2018 08:25:48 [name], [name] ([id number]) -------------------------------------------------------------------------------- HPI Details Patient Name: [name] Date of Service: 07/13/2018 8:00 AM Medical Record Number: [id number] Patient Account Number: [id number] Date of Birth/Sex: Jul 13, 1980 (37 y.o. M) Treating RN: Harold Barban Primary Care Provider: [name] Other Clinician: Referring Provider: [name] Treating Provider/Extender: Melburn Hake, Wilmar Prabhakar Weeks in Treatment: 35 History of Present Illness HPI Description: 12/04/16; 38 year old man who comes into the clinic today for review of a wound on the posterior left calf. He tells me that is been there for about a year. He is not a diabetic he does smoke half a pack per day. He was seen in the ER on 11/20/16 felt to have cellulitis around the wound and was given clindamycin. An x-ray did not show osteomyelitis. The patient initially tells me that he has a milk allergy that sets off a pruritic itching rash on his lower legs which she scratches incessantly and he thinks that's what may have set up the wound. He has been using various topical antibiotics and ointments without any effect. He works in a [employer] and is on his feet all day. He does not have a  prior history of wounds however he does have the rash on both lower legs the right arm and the ventral aspect of his left arm. These are excoriations and clearly have had scratching however there are of macular looking areas on both legs including a substantial larger area on the right leg. This does not have an underlying open area. There is no blistering. The patient tells me that 2 years ago in [place] in response to the rash on his legs he saw a dermatologist who told him he had a condition which may be pyoderma gangrenosum although I may be putting words into his mouth. He seemed to recognize this. 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 He is not in any treatment for this. He's had no recent travel 12/11/16; the patient arrives today with his wound and roughly the same condition we've been using silver alginate this is a deep punched out wound with some surrounding erythema but no tenderness. Biopsy I did did not show confirmed pyoderma gangrenosum suggested nonspecific inflammation and vasculitis but does not provide an actual description of what was seen by the pathologist. I'm really not able to understand this We have also received information from the patient's dermatologist in [place] notes from April 2016. This was a doctor Agarwal- antal. 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 He was prescribed topical steroid high potency under occlusion which helped but at this point the patient did not have a deep punched out wound. 12/18/16; the patient's wound is larger in terms of surface area however this surface looks better and there is less depth. The surrounding erythema also is better. The patient states that the wrap we put on came off 2 days ago when he has been using  his compression stockings. He we are in the process of getting a dermatology consult. 12/26/16 on evaluation today patient's left lower extremity wound shows evidence  of infection with surrounding erythema noted. He has been tolerating the dressing changes but states that he has noted more discomfort. There is a larger area of erythema surrounding the wound. No fevers, chills, nausea, or vomiting noted at this time. With that being said the wound still does have slough covering the surface. He is not allergic to any medication that he is aware of at this point. In regard to his right lower extremity he had several regions that are erythematous and pruritic he wonders if there's anything we can do to help that. 01/02/17 I reviewed patient's wound culture which was obtained his visit last week. He was placed on doxycycline at that point. Unfortunately that does not appear to be an antibiotic that would likely help with the situation however the pseudomonas noted on culture is sensitive to Cipro. Also unfortunately patient's wound seems to have a large compared to last week's evaluation. Not severely so but there are definitely increased measurements in general. He is continuing to have discomfort as well he writes this to be a seven out of 10. In fact he would prefer me not to perform any debridement today due to the fact that he is having discomfort and considering he has an active infection on the little reluctant to do so anyway. No fevers, chills, nausea, or vomiting noted at this time. 01/08/17; patient seems dermatology on September 5. I suspect dermatology will want the slides from the biopsy I did sent to their pathologist. I'm not sure if there is a way we can expedite that. In any case the culture I did before I left on vacation 3 weeks ago showed Pseudomonas he was given 10 days of Cipro and per her description of her intake nurses is actually somewhat better this week although the wound is quite a bit bigger than I remember the last time I saw this. He still has 3 more days of Cipro [name], [name] ([id number]) 01/21/17; dermatology appointment tomorrow. He  has completed the ciprofloxacin for Pseudomonas. Surface of the wound looks better however he is had some deterioration in the lesions on his right leg. Meantime the left lateral leg wound we will continue with sample 01/29/17; patient had his dermatology appointment but I can't yet see that note. He is completed his antibiotics. The wound is more superficial but considerably larger in circumferential area than when he came in. This is in his left lateral calf. He also has swollen erythematous areas with superficial wounds on the right leg and small papular areas on both arms. There apparently areas in her his upper thighs and buttocks I did not look at those. Dermatology biopsied the right leg. Hopefully will have their input next week. 02/05/17; patient went back to see his dermatologist who told him that he had a "scratching problem" as well as staph. He is now on a 30 day course of doxycycline and I believe she gave him triamcinolone cream to the right leg areas to help with the itching [not exactly sure but probably triamcinolone]. She apparently looked at the left lateral leg wound although this was not rebiopsied and I think felt to be ultimately part of the same pathogenesis. He is using sample border foam and changing nevus himself. He now has a new open area on the right posterior leg which was his biopsy site I don't  have any of the dermatology notes 02/12/17; we put the patient in compression last week with SANTYL to the wound on the left leg and the biopsy. Edema is much better and the depth of the wound is now at level of skin. Area is still the same oBiopsy site on the right lateral leg we've also been using santyl with a border foam dressing and he is changing this himself. 02/19/17; Using silver alginate started last week to both the substantial left leg wound and the biopsy site on the right wound. He is tolerating compression well. Has a an appointment with his primary M.D. tomorrow  wondering about diuretics although I'm wondering if the edema problem is actually lymphedema 02/26/17; the patient has been to see his primary doctor Dr. Jerrel Ivory at [facility] our primary care. She started him on Lasix 20 mg and this seems to have helped with the edema. However we are not making substantial change with the left lateral calf wound and inflammation. The biopsy site on the right leg also looks stable but not really all that different. 03/12/17; the patient has been to see vein and vascular Dr. Lucky Cowboy. He has had venous reflux studies I have not reviewed these. I did get a call from his dermatology office. 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 They therefore recommended clobetasol and tetracycline i.e. 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 03/26/17; apparently the patient just had reflux studies not an appointment with Dr. dew. She arrives in clinic today having applied clobetasol for 2-3 weeks. He notes over the last 2-3 days excessive drainage having to change the dressing 3-4 times a day and also expanding erythema. 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 Lillia Carmel at [facility] dermatology locally who kindly saw him at my request. 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 My concern was the degree of erythema medially extending from his left leg wound which was either inflammation from  pyoderma or cellulitis. I put him on Augmentin however culture of the wound showed Pseudomonas which is quinolone sensitive. I really don't believe he has cellulitis however in view of everything I will continue and give him a course of Cipro. He is also on doxycycline as an immune modulator for the pyoderma. In addition to his original wound on the left lateral leg with surrounding erythema he has a wound on the right posterior calf which was an original biopsy site done by dermatology. 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 Saw Dr. Lillia Carmel yesterday. He has been using topical antibiotics to both wound areas his original wound on the left and the biopsies/pathergy area on the right. There is definitely some improvement in the inflammation around the wound on the right although the patient states he has increasing sensitivity of the wounds. He is on prednisone 60 and doxycycline 1 as prescribed by Dr. Lillia Carmel. He is covering the topical  antibiotic with gauze and putting this in his own compression stocks and changing this daily. He states that Dr. Lottie Rater did a culture of the left leg wound yesterday 05/07/17; pyoderma gangrenosum. The patient saw Dr. Lillia Carmel yesterday and has a follow-up with her in one month. He is still using topical antibiotics to both wounds although he can't recall exactly what type. He is still on prednisone 60 mg. Dr. Lillia Carmel stated that the doxycycline could stop if we were in agreement. 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 The right medial leg was induced by biopsy/pathergy. The area on the right is essentially healed. 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 The right medial leg has closed and  remains closed. He is still on prednisone 60. oHe tells me he missed his last dermatology appointment with Dr. Lillia Carmel but will make another appointment. He reports that her blood sugar at a recent screen in [place] was high 200's. He was 180 today. He is more cushingoid blood pressure is [name], [name] ([id number]) up a bit. I think he is going to require still much longer prednisone perhaps another 3 months before attempting to taper. In the meantime his wound is a lot better. Smaller. He is cleaning this off daily and applying topical antibiotics. When he was last in the clinic I thought about changing to [facility] and actually put in a couple of calls to dermatology although probably not during their business hours. 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 He continues on oral prednisone. He has been using triple antibiotic ointment. There is surface debris and we will transition to [facility] and have him return in 2 weeks. He has lost 30 pounds since his last appointment with lifestyle modification. He may benefit from topical steroid cream for treatment this can be considered at a later date. 08/22/17 on evaluation today patient appears to actually be doing rather well in regard to his left lateral lower extremity ulcer. He has actually been managed by Dr. Dellia Nims most recently. Patient is currently on oral steroids at this time. This seems to have been of benefit for him. Nonetheless his last visit was actually with Leah on 08/06/17. Currently he is not utilizing any topical steroid creams although this could be of benefit as well. No fevers, chills, nausea, or vomiting noted at this time. 09/05/17 on evaluation today patient appears to be doing better in regard to his left lateral lower extremity ulcer. He has been tolerating the dressing changes without complication.  He is using Santyl with good effect. Overall I'm very pleased with how things are standing at this point. Patient likewise is happy that this is doing better. 09/19/17 on evaluation today patient actually appears to be doing rather well in regard to his left lateral lower extremity ulcer. 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 He's not having any significant pain. 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 He has been tolerating the Santyl without complication and in general I feel like he's making good  progress. 10/17/17 on evaluation today patient appears to be doing very well in regard to his left lateral lower surety ulcer. He has been tolerating the dressing changes without complication. There does not appear to be any evidence of infection he's alternating the Santyl and the triple antibiotic ointment every other day this seems to be doing well for him. 11/03/17 on evaluation today patient appears to be doing very well in regard to his left lateral lower extremity ulcer. He is been tolerating the dressing changes without complication which is good news. Fortunately there does not appear to be any evidence of infection which is also great news. Overall is doing excellent they are starting to taper down on the prednisone is down to 40 mg at this point it also started topical clobetasol for him. 11/17/17 on evaluation today patient appears to be doing well in regard to his left lateral lower surety ulcer. He's been tolerating the dressing changes without complication. He does note that he is having no pain, no excessive drainage or discharge, and overall he feels like things are going about how he would expect and hope they would. Overall he seems to have no evidence of infection at this time in my opinion which is  good news. 12/04/17-He is seen in follow-up evaluation for right lateral lower extremity ulcer. He has been applying topical steroid cream. Today's measurement show slight increase in size. Over the next 2 weeks we will transition to every other day Santyl and steroid cream. He has been encouraged to monitor for changes and notify clinic with any concerns 12/15/17 on evaluation today patient's left lateral motion the ulcer and fortunately is doing worse again at this point. This just since last week to this week has close to doubled in size according to the patient. I did not seeing last week's I do not have a visual to compare this to in our system was also down so we do not have all the charts and at this point. Nonetheless it does have me somewhat concerned in regard to the fact that again he was worried enough about it he has contact the dermatology that placed them back on the full strength, 50 mg a day of the prednisone that he was taken previous. He continues to alternate using clobetasol along with Santyl at this point. He is obviously somewhat frustrated. 12/22/17 on evaluation today patient appears to be doing a little worse compared to last evaluation. Unfortunately the wound is a little deeper and slightly larger than the last week's evaluation. With that being said he has made some progress in regard to the irritation surrounding at this time unfortunately despite that progress that's been made he still has a significant issue going on here. 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 For that reason I discussed with him today the possibility of perform a wound culture to ensure there's nothing overtly infected. 01/06/18 on evaluation today patient's wound is larger and deeper than previously evaluated. With that being said it did appear that  his wound was infected after my last evaluation with him. Subsequently I did end up prescribing a prescription for Bactrim DS which she has been taking and having no complication with. Fortunately there does not appear to be any evidence of [place], [place] ([id number]) infection at this point in time as far as anything spreading, no want to  touch, and overall I feel like things are showing signs of improvement. 01/13/18 on evaluation today patient appears to be even a little larger and deeper than last time. There still muscle exposed in the base of the wound. Nonetheless he does appear to be less erythematous I do believe inflammation is calming down also believe the infection looks like it's probably resolved at this time based on what I'm seeing. No fevers, chills, nausea, or vomiting noted at this time. 01/30/18 on evaluation today patient actually appears to visually look better for the most part. Unfortunately those visually this looks better he does seem to potentially have what may be an abscess in the muscle that has been noted in the central portion of the wound. This is the first time that I have noted what appears to be fluctuance in the central portion of the muscle. With that being said I'm somewhat more concerned about the fact that this might indicate an abscess formation at this location. I do believe that an ultrasound would be appropriate. This is likely something we need to try to do as soon as possible. He has been switch to mupirocin ointment and he is no longer using the steroid ointment as prescribed by dermatology he sees them again next week he's been decreased from 60 to 40 mg of prednisone. 03/09/18 on evaluation today patient actually appears to be doing a little better compared to last time I saw him. There's not as much erythema surrounding the wound itself. He I did review his most recent infectious disease note which was dated 02/24/18. He saw Dr. Michel Bickers in  [place]. 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 Patient is now off of antibiotics and they are continually observing this. There seems to be no change in the past two weeks in my pinion based on what the patient says and what I see today compared to what Dr. Megan Salon likely saw two weeks ago. No fevers, chills, nausea, or vomiting noted at this time. 03/23/18 on evaluation today patient's wound actually appears to be showing signs of improvement which is good news. He is currently still on the Dapsone. He is also working on tapering the prednisone to get off of this and Dr. Lottie Rater is working with him in this regard. Nonetheless overall I feel like the wound is doing well it does appear based on the infectious disease note that I reviewed from Dr. 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 I did also review the results of his ultrasound of left lower extremity which revealed there was a dentist tissue in the base of the wound without an abscess noted. 04/06/18 on evaluation today the patient's left lateral lower extremity ulcer actually appears to be doing fairly well which is excellent news. There does not appear to be any evidence of infection at this time which is also great news. Overall he still does have a significantly large ulceration although little by little he seems to be making progress. He is down to 10 mg a day of the prednisone. 04/20/18 on evaluation today patient actually appears to be doing excellent at this time in regard to his left lower extremity ulcer. He's making signs of good progress unfortunately this is taking much longer than we would really like  to see but nonetheless he is making progress. Fortunately there does not appear to be any evidence of infection at this  time. No fevers, chills, nausea, or vomiting noted at this time. The patient has not been using the Santyl due to the cost he hadn't got in this field yet. He's mainly been using the antibiotic ointment topically. Subsequently he also tells me that he really has not been scrubbing in the shower I think this would be helpful again as I told him it doesn't have to be anything too aggressive to even make it believe just enough to keep it free of some of the loose slough and biofilm on the wound surface. 05/11/18 on evaluation today patient's wound appears to be making slow but sure progress in regard to the left lateral lower extremity ulcer. He is been tolerating the dressing changes without complication. Fortunately there does not appear to be any evidence of infection at this time. He is still just using triple antibiotic ointment along with clobetasol occasionally over the area. He never got the Santyl and really does not seem to intend to in my pinion. 06/01/18 on evaluation today patient appears to be doing a little better in regard to his left lateral lower extremity ulcer. He states that overall he does not feel like he is doing as well with the Dapsone as he did with the prednisone. Nonetheless he sees his dermatologist later today and is gonna talk to them about the possibility of going back on the prednisone. Overall again I believe that the wound would be better if you would utilize Santyl but he really does not seem to be interested in going back to the [place] at this point. He has been using triple antibiotic ointment. 06/15/18 on evaluation today patient's wound actually appears to be doing about the same at this point. Fortunately there is no signs of infection at this time. He has made slight improvements although he continues to not really want to clean the wound bed at this point. He states that he just doesn't mess with it he doesn't want to cause any problems with everything else  he has going on. He has been on medication, antibiotics as prescribed by his dermatologist, for a staff infection of his lower extremities which is really drying out now and looking much better he tells me. Fortunately there is no sign of overall infection. [name], [name] ([id number]) 06/29/18 on evaluation today patient appears to be doing well in regard to his left lateral lower surety ulcer all things considering. Fortunately his staff infection seems to be greatly improved compared to previous. He has no signs of infection and this is drying up quite nicely. He is still the doxycycline for this is no longer on cental, Dapsone, or any of the other medications. His dermatologist has recommended possibility of an infusion but right now he does not want to proceed with that. 07/13/18 on evaluation today patient appears to be doing about the same in regard to his left lateral lower surety ulcer. Fortunately there's no signs of infection at this time which is great news. Unfortunately he still builds up a significant amount of Slough/biofilm of the surface of the wound he still is not really cleaning this as he should be appropriately. Again I'm able to easily with saline and gauze remove the majority of this on the surface which if you would do this at home would likely be a dramatic improvement for him as far as getting the area  to improve. Nonetheless overall I still feel like he is making progress is just very slow. I think Santyl will be of benefit for him as well. Still he has not gotten this as of this point. Electronic Signature(s) Signed: 07/14/2018 8:52:26 AM By: Worthy Keeler PA-C Entered By: Worthy Keeler on 07/13/2018 08:38:26 HAYATO, GUAMAN ([id number]) -------------------------------------------------------------------------------- Physical Exam Details Patient Name: [name] Date of Service: 07/13/2018 8:00 AM Medical Record Number: [id number] Patient Account Number:  [id number] Date of Birth/Sex: 02/05/1981 (37 y.o. M) Treating RN: Harold Barban Primary Care Provider: [name] Other Clinician: Referring Provider: [name] Treating Provider/Extender: Melburn Hake, Loranda Mastel Weeks in Treatment: 50 Constitutional Well-nourished and well-hydrated in no acute distress. Respiratory normal breathing without difficulty. clear to auscultation bilaterally. Cardiovascular regular rate and rhythm with normal S1, S2. Psychiatric this patient is able to make decisions and demonstrates good insight into disease process. Alert and Oriented x 3. pleasant and cooperative. Notes I did mechanically debrided the wound with saline and gauze today which he tolerated without complication post debridement the wound bed appears to be doing much better I was able to remove a significant amount of thick Slough coverage over the surface of the wound. Obviously this was likely biofilm associated as well. Nonetheless I think that if you was able to do this more regularly at home the wound was healed quickly. Electronic Signature(s) Signed: 07/14/2018 8:52:26 AM By: Worthy Keeler PA-C Entered By: Worthy Keeler on 07/13/2018 08:38:55 [name], [name] ([id number]) -------------------------------------------------------------------------------- Physician Orders Details Patient Name: [name] Date of Service: 07/13/2018 8:00 AM Medical Record Number: [id number] Patient Account Number: [id number] Date of Birth/Sex: 1980-12-10 (37 y.o. 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 Anesthetic (add to Medication List) Wound #1 Left,Lateral Lower Leg o Topical Lidocaine 4% cream applied to wound bed prior to debridement (In Clinic Only). Primary Wound Dressing Wound #1 Left,Lateral Lower Leg o Other: - Triple antibiotic ointment Secondary Dressing Wound #1 Left,Lateral Lower Leg o Boardered Foam Dressing Dressing Change Frequency Wound #1 Left,Lateral Lower Leg o Change dressing every day. Follow-up Appointments Wound #1 Left,Lateral Lower Leg o Return Appointment in 2 weeks. Edema Control Wound #1 Left,Lateral Lower Leg o Patient to wear own compression stockings o Elevate legs to the level of the heart and pump ankles as often as possible Medications-please add to medication list. Wound #1 Left,Lateral Lower Leg o P.O. Antibiotics - Prescribed by Dermatology for Staph Infection AMONTAE, NG ([id number]) Electronic Signature(s) Signed: 07/14/2018 8:52:26 AM By: Worthy Keeler PA-C Signed: 07/14/2018 11:13:25 AM By: Harold Barban Entered By: Harold Barban on 07/13/2018 08:32:37 [name], [name] ([id number]) -------------------------------------------------------------------------------- Problem List Details Patient Name: [name] Date of Service: 07/13/2018 8:00 AM Medical Record Number: [id number] Patient Account Number: [id number] Date of Birth/Sex: 12-09-1980 (37 y.o. M) Treating RN: Harold Barban Primary Care Provider: [name] Other Clinician: Referring Provider: [name] Treating Provider/Extender: Melburn Hake, Anival Pasha Weeks in Treatment: 26 Active Problems ICD-10 Evaluated Encounter Code Description Active Date Today Diagnosis L97.222 Non-pressure chronic ulcer of left calf with fat layer exposed 12/04/2016 No Yes L88 Pyoderma gangrenosum 03/26/2017  No Yes I87.2 Venous insufficiency (chronic) (peripheral) 12/04/2016 No  Yes Inactive Problems ICD-10 Code Description Active Date Inactive Date L97.213 Non-pressure chronic ulcer of right calf with necrosis of muscle 04/02/2017 04/02/2017 Resolved Problems Electronic Signature(s) Signed: 07/14/2018 8:52:26 AM By: Worthy Keeler PA-C Entered By: Worthy Keeler on 07/13/2018 08:25:43 [name], [name] ([id number]) -------------------------------------------------------------------------------- Progress Note Details Patient Name: [name] Date of Service: 07/13/2018 8:00 AM Medical Record Number: [id number] Patient Account Number: [id number] Date of Birth/Sex: [birth year] (37 y.o. M) Treating RN: Harold Barban Primary Care Provider: [name] Other Clinician: Referring Provider: [name] Treating Provider/Extender: Melburn Hake, Oleva Koo Weeks in Treatment: 28 Subjective Chief Complaint Information obtained from Patient He is here in follow up evaluation for LLE pyoderma ulcer History of Present Illness (HPI) 12/04/16; 38 year old man who comes into the clinic today for review of a wound on the posterior left calf. He tells me that is been there for about a year. He is not a diabetic he does smoke half a pack per day. He was seen in the ER on 11/20/16 felt to have cellulitis around the wound and was given clindamycin. An x-ray did not show osteomyelitis. The patient initially tells me that he has a milk allergy that sets off a pruritic itching rash on his lower legs which she scratches incessantly and he thinks that's what may have set up the wound. He has been using various topical antibiotics and ointments without any effect. He works in a [employer] and is on his feet all day. He does not have a prior history of wounds however he does have the rash on both lower legs the right arm and the ventral aspect of his left arm. These are excoriations and clearly have had scratching however there are of macular looking areas on both legs including a  substantial larger area on the right leg. This does not have an underlying open area. There is no blistering. The patient tells me that 2 years ago in [place] in response to the rash on his legs he saw a dermatologist who told him he had a condition which may be pyoderma gangrenosum although I may be putting words into his mouth. He seemed to recognize this. 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 He is not in any treatment for this. He's had no recent travel 12/11/16; the patient arrives today with his wound and roughly the same condition we've been using silver alginate this is a deep punched out wound with some surrounding erythema but no tenderness. Biopsy I did did not show confirmed pyoderma gangrenosum suggested nonspecific inflammation and vasculitis but does not provide an actual description of what was seen by the pathologist. I'm really not able to understand this We have also received information from the patient's dermatologist in [place] notes from April 2016. This was a doctor Agarwal- antal. 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 He was prescribed topical steroid high potency under occlusion which helped but at this point the patient did not have a deep punched out wound. 12/18/16; the patient's wound is larger in terms of surface area however this surface looks better and there is less depth. The surrounding erythema also is better. The patient states that the wrap we put on came off 2 days ago when he has been using his compression stockings. He we are in the process of getting a dermatology consult. 12/26/16 on evaluation today patient's left lower extremity wound shows  evidence of infection with surrounding erythema noted. He has been tolerating the dressing changes but states that he has noted more discomfort. There is a larger area of erythema surrounding the wound. No fevers, chills, nausea, or vomiting noted at this  time. With that being said the wound still does have slough covering the surface. He is not allergic to any medication that he is aware of at this point. In regard to his right lower extremity he had several regions that are erythematous and pruritic he wonders if there's anything we can do to help that. 01/02/17 I reviewed patient's wound culture which was obtained his visit last week. He was placed on doxycycline at that point. Unfortunately that does not appear to be an antibiotic that would likely help with the situation however the pseudomonas noted on culture is sensitive to Cipro. Also unfortunately patient's wound seems to have a large compared to last week's evaluation. Not severely so but there are definitely increased measurements in general. He is continuing to have discomfort as well he writes this to be a seven out of 10. In fact he would prefer me not to perform any debridement today due to the fact that he is having discomfort and considering he has an active infection on the little reluctant to do so anyway. No fevers, Bachmeier, Pavel ([id number]) chills, nausea, or vomiting noted at this time. 01/08/17; patient seems dermatology on September 5. I suspect dermatology will want the slides from the biopsy I did sent to their pathologist. I'm not sure if there is a way we can expedite that. In any case the culture I did before I left on vacation 3 weeks ago showed Pseudomonas he was given 10 days of Cipro and per her description of her intake nurses is actually somewhat better this week although the wound is quite a bit bigger than I remember the last time I saw this. He still has 3 more days of Cipro 01/21/17; dermatology appointment tomorrow. He has completed the ciprofloxacin for Pseudomonas. Surface of the wound looks better however he is had some deterioration in the lesions on his right leg. Meantime the left lateral leg wound we will continue with sample 01/29/17; patient had his  dermatology appointment but I can't yet see that note. He is completed his antibiotics. The wound is more superficial but considerably larger in circumferential area than when he came in. This is in his left lateral calf. He also has swollen erythematous areas with superficial wounds on the right leg and small papular areas on both arms. There apparently areas in her his upper thighs and buttocks I did not look at those. Dermatology biopsied the right leg. Hopefully will have their input next week. 02/05/17; patient went back to see his dermatologist who told him that he had a "scratching problem" as well as staph. He is now on a 30 day course of doxycycline and I believe she gave him triamcinolone cream to the right leg areas to help with the itching [not exactly sure but probably triamcinolone]. She apparently looked at the left lateral leg wound although this was not rebiopsied and I think felt to be ultimately part of the same pathogenesis. He is using sample border foam and changing nevus himself. He now has a new open area on the right posterior leg which was his biopsy site I don't have any of the dermatology notes 02/12/17; we put the patient in compression last week with SANTYL to the wound on the left leg  and the biopsy. Edema is much better and the depth of the wound is now at level of skin. Area is still the same Biopsy site on the right lateral leg we've also been using santyl with a border foam dressing and he is changing this himself. 02/19/17; Using silver alginate started last week to both the substantial left leg wound and the biopsy site on the right wound. He is tolerating compression well. Has a an appointment with his primary M.D. tomorrow wondering about diuretics although I'm wondering if the edema problem is actually lymphedema 02/26/17; the patient has been to see his primary doctor Dr. Jerrel Ivory at [facility] our primary care. She started him on Lasix 20 mg and this seems  to have helped with the edema. However we are not making substantial change with the left lateral calf wound and inflammation. The biopsy site on the right leg also looks stable but not really all that different. 03/12/17; the patient has been to see vein and vascular Dr. Lucky Cowboy. He has had venous reflux studies I have not reviewed these. I did get a call from his dermatology office. 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 They therefore recommended clobetasol and tetracycline i.e. 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 03/26/17; apparently the patient just had reflux studies not an appointment with Dr. dew. She arrives in clinic today having applied clobetasol for 2-3 weeks. He notes over the last 2-3 days excessive drainage having to change the dressing 3-4 times a day and also expanding erythema. 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 Lillia Carmel at [facility] dermatology locally who kindly saw him at my request. 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 My concern was the degree of erythema medially extending from his left leg wound which was either inflammation from pyoderma or cellulitis. I put him on Augmentin however culture of the wound showed Pseudomonas which is quinolone sensitive. I really don't believe he has cellulitis however in view of everything I will continue and give him a course of Cipro.  He is also on doxycycline as an immune modulator for the pyoderma. In addition to his original wound on the left lateral leg with surrounding erythema he has a wound on the right posterior calf which was an original biopsy site done by dermatology. 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 Saw Dr. Lillia Carmel yesterday. He has been using topical antibiotics to both wound areas his original wound on the left and the biopsies/pathergy area on the right. There is definitely some improvement in the inflammation around the wound on the right although the patient states he has increasing sensitivity of the wounds. He is on prednisone 60 and doxycycline 1 as prescribed by Dr. Lillia Carmel. He is covering the topical antibiotic with gauze and putting this in his own compression stocks and changing this daily. He states that Dr. Lottie Rater did a culture of  the left leg wound yesterday 05/07/17; pyoderma gangrenosum. The patient saw Dr. Lillia Carmel yesterday and has a follow-up with her in one month. He is still using topical antibiotics to both wounds although he can't recall exactly what type. He is still on prednisone 60 mg. Dr. Lillia Carmel stated that the doxycycline could stop if we were in agreement. 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 The right medial leg was induced by [name] ([id number]) biopsy/pathergy. The area on the right is essentially healed. 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 The right medial leg has closed and remains closed. He is still on prednisone 60. He tells me he missed his last dermatology appointment with Dr. Lillia Carmel but will make another appointment. He reports that her blood sugar at a recent screen in [place] was  high 200's. He was 180 today. 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 I think he is going to require still much longer prednisone perhaps another 3 months before attempting to taper. In the meantime his wound is a lot better. Smaller. He is cleaning this off daily and applying topical antibiotics. When he was last in the clinic I thought about changing to [facility] and actually put in a couple of calls to dermatology although probably not during their business hours. 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 He continues on oral prednisone. He has been using triple antibiotic ointment. There is surface debris and we will transition to [facility] and have him return in 2 weeks. He has lost 30 pounds since his last appointment with lifestyle modification. He may benefit from topical steroid cream for treatment this can be considered at a later date. 08/22/17 on evaluation today patient appears to actually be doing rather well in regard to his left lateral lower extremity ulcer. He has actually been managed by Dr. Dellia Nims most recently. Patient is currently on oral steroids at this time. This seems to have been of benefit for him. Nonetheless his last visit was actually with Leah on 08/06/17. Currently he is not utilizing any topical steroid creams although this could be of benefit as well. No fevers, chills, nausea, or vomiting noted at this time. 09/05/17 on evaluation today patient appears to be doing better in regard to his left lateral lower extremity ulcer. He has been tolerating the dressing changes without complication. He is using Santyl with good effect. Overall I'm very pleased with how things are standing at this point. Patient likewise is happy that this is doing better. 09/19/17 on evaluation today patient actually appears to be doing rather well in regard to his  left lateral lower extremity ulcer. 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 He's not having any significant pain. 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 He has been tolerating the Santyl without complication and in general I feel like he's making good progress. 10/17/17 on evaluation today patient appears to be doing very well in regard to his left lateral lower surety ulcer. He has been  tolerating the dressing changes without complication. There does not appear to be any evidence of infection he's alternating the Santyl and the triple antibiotic ointment every other day this seems to be doing well for him. 11/03/17 on evaluation today patient appears to be doing very well in regard to his left lateral lower extremity ulcer. He is been tolerating the dressing changes without complication which is good news. Fortunately there does not appear to be any evidence of infection which is also great news. Overall is doing excellent they are starting to taper down on the prednisone is down to 40 mg at this point it also started topical clobetasol for him. 11/17/17 on evaluation today patient appears to be doing well in regard to his left lateral lower surety ulcer. He's been tolerating the dressing changes without complication. He does note that he is having no pain, no excessive drainage or discharge, and overall he feels like things are going about how he would expect and hope they would. Overall he seems to have no evidence of infection at this time in my opinion which is good news. 12/04/17-He is seen in follow-up evaluation for right lateral lower extremity ulcer. He has been applying topical steroid cream. Today's measurement show slight increase in size. Over the next 2 weeks we will transition to every other day  Santyl and steroid cream. He has been encouraged to monitor for changes and notify clinic with any concerns 12/15/17 on evaluation today patient's left lateral motion the ulcer and fortunately is doing worse again at this point. This just since last week to this week has close to doubled in size according to the patient. I did not seeing last week's I do not have a visual to compare this to in our system was also down so we do not have all the charts and at this point. Nonetheless it does have me somewhat concerned in regard to the fact that again he was worried enough about it he has contact the dermatology that placed them back on the full strength, 50 mg a day of the prednisone that he was taken previous. He continues to alternate using clobetasol along with Santyl at this point. He is obviously somewhat frustrated. 12/22/17 on evaluation today patient appears to be doing a little worse compared to last evaluation. Unfortunately the wound is a little deeper and slightly larger than the last week's evaluation. With that being said he has made some progress in regard to the irritation surrounding at this time unfortunately despite that progress that's been made he still has a significant issue going on here. I'm not certain that he is having really any true infection at this time although with the Pyoderma gangrenosum it can Ripoll, [name] ([id number]) sometimes be difficult to differentiate infection versus just inflammation. For that reason I discussed with him today the possibility of perform a wound culture to ensure there's nothing overtly infected. 01/06/18 on evaluation today patient's wound is larger and deeper than previously evaluated. With that being said it did appear that his wound was infected after my last evaluation with him. Subsequently I did end up prescribing a prescription for Bactrim DS which she has been taking and having no complication with. Fortunately there does not appear to  be any evidence of infection at this point in time as far as anything spreading, no want to touch, and overall I feel like things are showing signs of improvement. 01/13/18 on evaluation today patient appears to be even a little larger  and deeper than last time. There still muscle exposed in the base of the wound. Nonetheless he does appear to be less erythematous I do believe inflammation is calming down also believe the infection looks like it's probably resolved at this time based on what I'm seeing. No fevers, chills, nausea, or vomiting noted at this time. 01/30/18 on evaluation today patient actually appears to visually look better for the most part. Unfortunately those visually this looks better he does seem to potentially have what may be an abscess in the muscle that has been noted in the central portion of the wound. This is the first time that I have noted what appears to be fluctuance in the central portion of the muscle. With that being said I'm somewhat more concerned about the fact that this might indicate an abscess formation at this location. I do believe that an ultrasound would be appropriate. This is likely something we need to try to do as soon as possible. He has been switch to mupirocin ointment and he is no longer using the steroid ointment as prescribed by dermatology he sees them again next week he's been decreased from 60 to 40 mg of prednisone. 03/09/18 on evaluation today patient actually appears to be doing a little better compared to last time I saw him. There's not as much erythema surrounding the wound itself. He I did review his most recent infectious disease note which was dated 02/24/18. He saw Dr. Michel Bickers in [place]. 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 Patient is now off of antibiotics and they are continually observing this. There seems to be no change in the  past two weeks in my pinion based on what the patient says and what I see today compared to what Dr. Megan Salon likely saw two weeks ago. No fevers, chills, nausea, or vomiting noted at this time. 03/23/18 on evaluation today patient's wound actually appears to be showing signs of improvement which is good news. He is currently still on the Dapsone. He is also working on tapering the prednisone to get off of this and Dr. Lottie Rater is working with him in this regard. Nonetheless overall I feel like the wound is doing well it does appear based on the infectious disease note that I reviewed from Dr. 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 I did also review the results of his ultrasound of left lower extremity which revealed there was a dentist tissue in the base of the wound without an abscess noted. 04/06/18 on evaluation today the patient's left lateral lower extremity ulcer actually appears to be doing fairly well which is excellent news. There does not appear to be any evidence of infection at this time which is also great news. Overall he still does have a significantly large ulceration although little by little he seems to be making progress. He is down to 10 mg a day of the prednisone. 04/20/18 on evaluation today patient actually appears to be doing excellent at this time in regard to his left lower extremity ulcer. He's making signs of good progress unfortunately this is taking much longer than we would really like to see but nonetheless he is making progress. Fortunately there does not appear to be any evidence of infection at this time. No fevers,  chills, nausea, or vomiting noted at this time. The patient has not been using the Santyl due to the cost he hadn't got in this field yet. He's mainly been using the antibiotic ointment topically. Subsequently he also tells me that he  really has not been scrubbing in the shower I think this would be helpful again as I told him it doesn't have to be anything too aggressive to even make it believe just enough to keep it free of some of the loose slough and biofilm on the wound surface. 05/11/18 on evaluation today patient's wound appears to be making slow but sure progress in regard to the left lateral lower extremity ulcer. He is been tolerating the dressing changes without complication. Fortunately there does not appear to be any evidence of infection at this time. He is still just using triple antibiotic ointment along with clobetasol occasionally over the area. He never got the Santyl and really does not seem to intend to in my pinion. 06/01/18 on evaluation today patient appears to be doing a little better in regard to his left lateral lower extremity ulcer. He states that overall he does not feel like he is doing as well with the Dapsone as he did with the prednisone. Nonetheless he sees his dermatologist later today and is gonna talk to them about the possibility of going back on the prednisone. Overall again I believe that the wound would be better if you would utilize Santyl but he really does not seem to be interested in going back to the [place] at this point. He has been using triple antibiotic ointment. MART, COLPITTS ([id number]) 06/15/18 on evaluation today patient's wound actually appears to be doing about the same at this point. Fortunately there is no signs of infection at this time. He has made slight improvements although he continues to not really want to clean the wound bed at this point. He states that he just doesn't mess with it he doesn't want to cause any problems with everything else he has going on. He has been on medication, antibiotics as prescribed by his dermatologist, for a staff infection of his lower extremities which is really drying out now and looking much better he tells me. Fortunately there is  no sign of overall infection. 06/29/18 on evaluation today patient appears to be doing well in regard to his left lateral lower surety ulcer all things considering. Fortunately his staff infection seems to be greatly improved compared to previous. He has no signs of infection and this is drying up quite nicely. He is still the doxycycline for this is no longer on cental, Dapsone, or any of the other medications. His dermatologist has recommended possibility of an infusion but right now he does not want to proceed with that. 07/13/18 on evaluation today patient appears to be doing about the same in regard to his left lateral lower surety ulcer. Fortunately there's no signs of infection at this time which is great news. Unfortunately he still builds up a significant amount of Slough/biofilm of the surface of the wound he still is not really cleaning this as he should be appropriately. Again I'm able to easily with saline and gauze remove the majority of this on the surface which if you would do this at home would likely be a dramatic improvement for him as far as getting the area to improve. Nonetheless overall I still feel like he is making progress is just very slow. I think Santyl will be of benefit for  him as well. Still he has not gotten this as of this point. Patient History Information obtained from Patient. Family History Diabetes - Mother,Maternal Grandparents, Heart Disease - Mother,Maternal Grandparents, Hereditary Spherocytosis - Siblings, Hypertension - Maternal Grandparents, Stroke - Siblings, No family history of Cancer, Kidney Disease, Lung Disease, Seizures, Thyroid Problems, Tuberculosis. Social History Current some day smoker, Marital Status - Married, Alcohol Use - Rarely, Drug Use - No History, Caffeine Use - Moderate. Medical History Eyes Denies history of Cataracts, Glaucoma, Optic Neuritis Ear/Nose/Mouth/Throat Denies history of Chronic sinus problems/congestion, Middle  ear problems Hematologic/Lymphatic Denies history of Anemia, Hemophilia, Human Immunodeficiency Virus, Lymphedema, Sickle Cell Disease Respiratory Patient has history of Sleep Apnea - cpap Denies history of Aspiration, Asthma, Chronic Obstructive Pulmonary Disease (COPD), Pneumothorax Cardiovascular Patient has history of Hypertension Denies history of Angina, Arrhythmia, Congestive Heart Failure, Coronary Artery Disease, Deep Vein Thrombosis, Hypotension, Myocardial Infarction, Peripheral Arterial Disease, Peripheral Venous Disease, Phlebitis, Vasculitis Gastrointestinal Patient has history of Colitis Endocrine Denies history of Type I Diabetes, Type II Diabetes Genitourinary Denies history of End Stage Renal Disease Immunological Denies history of Lupus Erythematosus, Raynaud s, Scleroderma Integumentary (Skin) Denies history of History of Burn, History of pressure wounds Musculoskeletal Denies history of Gout, Rheumatoid Arthritis, Osteoarthritis, Osteomyelitis Neurologic Denies history of Dementia, Neuropathy, Quadriplegia, Paraplegia, Seizure Disorder Oncologic [name], [name] ([id number]) Denies history of Received Chemotherapy, Received Radiation Psychiatric Denies history of Anorexia/bulimia, Confinement Anxiety Review of Systems (ROS) Constitutional Symptoms (General Health) Denies complaints or symptoms of Fever, Chills. Respiratory The patient has no complaints or symptoms. Cardiovascular The patient has no complaints or symptoms. Psychiatric The patient has no complaints or symptoms. Objective Constitutional Well-nourished and well-hydrated in no acute distress. Vitals Time Taken: 8:10 AM, Height: 71 in, Weight: 338 lbs, BMI: 47.1, Temperature: 98.2 F, Pulse: 73 bpm, Respiratory Rate: 18 breaths/min, Blood Pressure: 119/61 mmHg. Respiratory normal breathing without difficulty. clear to auscultation bilaterally. Cardiovascular regular rate and rhythm with  normal S1, S2. Psychiatric this patient is able to make decisions and demonstrates good insight into disease process. Alert and Oriented x 3. pleasant and cooperative. General Notes: I did mechanically debrided the wound with saline and gauze today which he tolerated without complication post debridement the wound bed appears to be doing much better I was able to remove a significant amount of thick Slough coverage over the surface of the wound. Obviously this was likely biofilm associated as well. Nonetheless I think that if you was able to do this more regularly at home the wound was healed quickly. Integumentary (Hair, Skin) Wound #1 status is Open. Original cause of wound was Gradually Appeared. The wound is located on the Left,Lateral Lower Leg. The wound measures 3.5cm length x 3.8cm width x 0.2cm depth; 10.446cm^2 area and 2.089cm^3 volume. There is Fat Layer (Subcutaneous Tissue) Exposed exposed. There is no tunneling or undermining noted. There is a medium amount of purulent drainage noted. The wound margin is distinct with the outline attached to the wound base. There is no granulation within the wound bed. There is a large (67-100%) amount of necrotic tissue within the wound bed including Adherent Slough. The periwound skin appearance exhibited: Induration, Scarring, Dry/Scaly, Hemosiderin Staining, Erythema. The periwound skin appearance did not exhibit: Callus, Crepitus, Excoriation, Rash, Maceration, Atrophie Blanche, Cyanosis, Ecchymosis, Mottled, Pallor, Rubor. The surrounding wound skin color is noted with erythema. Periwound temperature was noted as No Abnormality. The periwound has tenderness on palpation. 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 Anesthetic (add to Medication List): Wound #1 Left,Lateral Lower Leg: Topical Lidocaine 4% cream applied to wound bed prior to debridement (In Clinic Only). Primary Wound Dressing: Wound #1 Left,Lateral Lower Leg: Other: - Triple antibiotic ointment Secondary Dressing: Wound #1 Left,Lateral Lower Leg: Boardered Foam Dressing Dressing Change Frequency: Wound #1 Left,Lateral Lower Leg: Change dressing every day. Follow-up Appointments: Wound #1 Left,Lateral Lower Leg: Return Appointment in 2 weeks. Edema Control: Wound #1 Left,Lateral Lower Leg: Patient to wear own compression stockings Elevate legs to the level of the heart and pump ankles as often as possible Medications-please add to medication list.: Wound #1 Left,Lateral Lower Leg: P.O. Antibiotics - Prescribed by Dermatology for Staph Infection I'm gonna suggest currently that we continue with the above wound care measures for the next week. Patient is in agreement with plan. We will subsequently see were things stand at follow-up. If anything changes or worsens the meantime he will contact the office and let me know. Please see above for specific wound care orders. We will see patient for re-evaluation in 2 week(s) here in the clinic. If anything worsens or changes patient will contact our office for additional recommendations. Electronic Signature(s) Signed: 07/14/2018 8:52:26 AM By: Hedy Camara, Kaiyan ([id number]) Entered By: Worthy Keeler on 07/13/2018 08:39:36 ALWALEED, OBESO ([id number]) -------------------------------------------------------------------------------- ROS/PFSH Details Patient Name: [name] Date of Service: 07/13/2018 8:00 AM Medical Record Number: [id number] Patient Account Number: [id number] Date of Birth/Sex: [birth year] (37 y.o. M) Treating RN: Harold Barban Primary Care Provider: [name] Other Clinician: Referring Provider: [name] Treating Provider/Extender: Melburn Hake, Ly Wass Weeks in Treatment: 32 Information Obtained From Patient Wound History Do you currently have one or more open woundso Yes How many open wounds do you currently haveo 1 Approximately how long have you had your woundso 1 year How have you been treating your wound(s) until nowo neosporin Has your wound(s) ever healed and then re-openedo No Have you had any lab work done in the past montho No Have you tested positive for an antibiotic resistant organism (MRSA, VRE)o No Have you tested positive for osteomyelitis (bone infection)o No Have you had any tests for circulation on your legso No Have you had other problems associated with your woundso Infection, Swelling Constitutional Symptoms (General Health) Complaints and Symptoms: Negative for: Fever; Chills Eyes Medical History: Negative for: Cataracts; Glaucoma; Optic Neuritis Ear/Nose/Mouth/Throat Medical History: Negative for: Chronic sinus problems/congestion; Middle ear problems Hematologic/Lymphatic Medical History: Negative for: Anemia; Hemophilia; Human Immunodeficiency Virus; Lymphedema; Sickle Cell Disease Respiratory Complaints and Symptoms: No Complaints or Symptoms Medical History: Positive for: Sleep Apnea - cpap Negative for: Aspiration; Asthma; Chronic Obstructive Pulmonary Disease (COPD); Pneumothorax Cardiovascular Complaints and Symptoms: No Complaints or Symptoms Medical HistoryCAVIN, LONGMAN ([id number]) Positive for: Hypertension Negative for: Angina; Arrhythmia; Congestive Heart Failure; Coronary Artery Disease; Deep Vein Thrombosis; Hypotension; Myocardial Infarction; Peripheral Arterial Disease; Peripheral Venous Disease; Phlebitis; Vasculitis Gastrointestinal Medical History: Positive for: Colitis Endocrine Medical History: Negative for: Type I Diabetes; Type II Diabetes Genitourinary Medical History: Negative for: End Stage Renal  Disease Immunological Medical History: Negative for: Lupus Erythematosus; Raynaudos; Scleroderma Integumentary (Skin) Medical History: Negative for: History of Burn; History of pressure wounds Musculoskeletal Medical History: Negative for: Gout; Rheumatoid Arthritis; Osteoarthritis; Osteomyelitis Neurologic Medical History: Negative for: Dementia; Neuropathy; Quadriplegia; Paraplegia; Seizure Disorder  Oncologic Medical History: Negative for: Received Chemotherapy; Received Radiation Psychiatric Complaints and Symptoms: No Complaints or Symptoms Medical History: Negative for: Anorexia/bulimia; Confinement Anxiety Immunizations Pneumococcal Vaccine: Received Pneumococcal Vaccination: No Implantable Devices No devices added SHLOMIE, ROMIG ([id number]) Family and Social History Cancer: No; Diabetes: Yes - Mother,Maternal Grandparents; Heart Disease: Yes - Mother,Maternal Grandparents; Hereditary Spherocytosis: Yes - Siblings; Hypertension: Yes - Maternal Grandparents; Kidney Disease: No; Lung Disease: No; Seizures: No; Stroke: Yes - Siblings; Thyroid Problems: No; Tuberculosis: No; Current some day smoker; Marital Status - Married; Alcohol Use: Rarely; Drug Use: No History; Caffeine Use: Moderate; Financial Concerns: No; Food, Clothing or Shelter Needs: No; Support System Lacking: No; Transportation Concerns: No; Advanced Directives: No; Patient does not want information on Advanced Directives; Living Will: No Physician Affirmation I have reviewed and agree with the above information. Electronic Signature(s) Signed: 07/14/2018 8:52:26 AM By: Worthy Keeler PA-C Signed: 07/14/2018 11:13:25 AM By: Harold Barban Entered By: Worthy Keeler on 07/13/2018 08:38:39 ZAKHAI, MEISINGER ([id number]) -------------------------------------------------------------------------------- SuperBill Details Patient Name: [name] Date of Service: 07/13/2018 Medical Record Number: [id number] Patient  Account Number: [id number] Date of Birth/Sex: 1980/10/30 (37 y.o. M) Treating RN: Harold Barban Primary Care Provider: [name] Other Clinician: Referring Provider: [name] Treating Provider/Extender: Melburn Hake, Gregary Blackard Weeks in Treatment: 21 Diagnosis Coding ICD-10 Codes Code Description [id number] Non-pressure chronic ulcer of left calf with fat layer exposed L88 Pyoderma gangrenosum I87.2 Venous insufficiency (chronic) (peripheral) Facility Procedures CPT4 Code: 48889169 Description: 45038 - WOUND CARE VISIT-LEV 2 EST PT Modifier: Quantity: 1 Physician Procedures CPT4 Code: 8828003 Description: 49179 - WC PHYS LEVEL 4 - EST PT ICD-10 Diagnosis Description L97.222 Non-pressure chronic ulcer of left calf with fat layer expo L88 Pyoderma gangrenosum I87.2 Venous insufficiency (chronic) (peripheral) Modifier: sed Quantity: 1 Electronic Signature(s) Signed: 07/14/2018 8:52:26 AM By: Worthy Keeler PA-C Entered By: Worthy Keeler on 07/13/2018 08:39:47

## 2018-07-16 NOTE — Progress Notes (Signed)
NAZAIAH, [name] ([id number]) Visit Report for 07/13/2018 Arrival Information Details Patient Name: [name], [name] Date of Service: 07/13/2018 8:00 AM Medical Record Number: [id number] Patient Account Number: [id number] Date of Birth/Sex: 1980-05-29 (38 y.o. M) Treating RN: Harold Barban Primary Care Gavyn Zoss: [name] Other Clinician: Referring Yelitza Reach: [name] Treating Guilianna Mckoy/Extender: Melburn Hake, [name]: 101 Visit Information History Since Last Visit Added or deleted any medications: No Patient Arrived: Ambulatory Any new allergies or adverse reactions: No Arrival Time: 08:05 Had a fall or experienced change in No Accompanied By: self activities of daily living that may affect Transfer Assistance: None risk of falls: Patient Identification Verified: Yes Signs or symptoms of abuse/neglect since last visito No Secondary Verification Process Completed: Yes Hospitalized since last visit: No Patient Requires Transmission-Based No Implantable device outside of the clinic excluding No Precautions: cellular tissue based products placed in the center Patient Has Alerts: No since last visit: Has Dressing in Place as Prescribed: Yes Pain Present Now: No Electronic Signature(s) Signed: 07/13/2018 3:28:24 PM By: Lorine Bears RCP, RRT, CHT Entered By: Lorine Bears on 07/13/2018 08:10:03 [name] ([id number]) -------------------------------------------------------------------------------- Clinic Level of Care Assessment Details Patient Name: [name] Date of Service: 07/13/2018 8:00 AM Medical Record Number: [id number] Patient Account Number: [id number] Date of Birth/Sex: January 30, 1981 (38 y.o. M) Treating RN: Harold Barban Primary Care Zacari Radick: [name] Other Clinician: Referring Javani Spratt: [name] Treating Bonner Larue/Extender: Melburn Hake, [name]: 57 Clinic Level of Care Assessment  Items TOOL 4 Quantity Score []  - Use when only an EandM is performed on FOLLOW-UP visit 0 ASSESSMENTS - Nursing Assessment / Reassessment X - Reassessment of Co-morbidities (includes updates in patient status) 1 10 X- 1 5 Reassessment of Adherence to Treatment Plan ASSESSMENTS - Wound and Skin Assessment / Reassessment []  - Simple Wound Assessment / Reassessment - one wound 0 []  - 0 Complex Wound Assessment / Reassessment - multiple wounds []  - 0 Dermatologic / Skin Assessment (not related to wound area) ASSESSMENTS - Focused Assessment []  - Circumferential Edema Measurements - multi extremities 0 []  - 0 Nutritional Assessment / Counseling / Intervention []  - 0 Lower Extremity Assessment (monofilament, tuning fork, pulses) []  - 0 Peripheral Arterial Disease Assessment (using hand held doppler) ASSESSMENTS - Ostomy and/or Continence Assessment and Care []  - Incontinence Assessment and Management 0 []  - 0 Ostomy Care Assessment and Management (repouching, etc.) PROCESS - Coordination of Care X - Simple Patient / Family Education for ongoing care 1 15 []  - 0 Complex (extensive) Patient / Family Education for ongoing care []  - 0 Staff obtains Programmer, systems, Records, Test Results / Process Orders []  - 0 Staff telephones HHA, Nursing Homes / Clarify orders / etc []  - 0 Routine Transfer to another Facility (non-emergent condition) []  - 0 Routine Hospital Admission (non-emergent condition) []  - 0 New Admissions / Biomedical engineer / Ordering NPWT, Apligraf, etc. []  - 0 Emergency Hospital Admission (emergent condition) X- 1 10 Simple Discharge Coordination Goshert, [name] ([id number]) []  - 0 Complex (extensive) Discharge Coordination PROCESS - Special Needs []  - Pediatric / Minor Patient Management 0 []  - 0 Isolation Patient Management []  - 0 Hearing / Language / Visual special needs []  - 0 Assessment of Community assistance (transportation, D/C planning, etc.) []  -  0 Additional assistance / Altered mentation []  - 0 Support Surface(s) Assessment (bed, cushion, seat, etc.) INTERVENTIONS - Wound Cleansing / Measurement X - Simple Wound Cleansing - one wound 1 5 []  - 0 Complex Wound Cleansing -  multiple wounds X- 1 5 Wound Imaging (photographs - any number of wounds) []  - 0 Wound Tracing (instead of photographs) X- 1 5 Simple Wound Measurement - one wound []  - 0 Complex Wound Measurement - multiple wounds INTERVENTIONS - Wound Dressings X - Small Wound Dressing one or multiple wounds 1 10 []  - 0 Medium Wound Dressing one or multiple wounds []  - 0 Large Wound Dressing one or multiple wounds []  - 0 Application of Medications - topical []  - 0 Application of Medications - injection INTERVENTIONS - Miscellaneous []  - External ear exam 0 []  - 0 Specimen Collection (cultures, biopsies, blood, body fluids, etc.) []  - 0 Specimen(s) / Culture(s) sent or taken to Lab for analysis []  - 0 Patient Transfer (multiple staff / Civil Service fast streamer / Similar devices) []  - 0 Simple Staple / Suture removal (25 or less) []  - 0 Complex Staple / Suture removal (26 or more) []  - 0 Hypo / Hyperglycemic Management (close monitor of Blood Glucose) []  - 0 Ankle / Brachial Index (ABI) - do not check if billed separately X- 1 5 Vital Signs Wyss, Jmichael ([id number]) Has the patient been seen at the hospital within the last three years: Yes Total Score: 70 Level Of Care: New/Established - Level 2 Electronic Signature(s) Signed: 07/14/2018 11:13:25 AM By: Harold Barban Entered By: Harold Barban on 07/13/2018 08:30:47 [name] ([id number]) -------------------------------------------------------------------------------- Encounter Discharge Information Details Patient Name: [name] Date of Service: 07/13/2018 8:00 AM Medical Record Number: [id number] Patient Account Number: [id number] Date of Birth/Sex: 04/28/1981 (38 y.o. M) Treating RN: Army Melia Primary  Care Damauri Minion: [name] Other Clinician: Referring Tamari Busic: [name] Treating Ignatz Deis/Extender: Melburn Hake, [name]: 7 Encounter Discharge Information Items Discharge Condition: Stable Ambulatory Status: Ambulatory Discharge Destination: Home Transportation: Private Auto Accompanied By: self Schedule Follow-up Appointment: Yes Clinical Summary of Care: Electronic Signature(s) Signed: 07/15/2018 9:17:40 AM By: Army Melia Entered By: Army Melia on 07/13/2018 09:15:22 [name] ([id number]) -------------------------------------------------------------------------------- Lower Extremity Assessment Details Patient Name: [name] Date of Service: 07/13/2018 8:00 AM Medical Record Number: [id number] Patient Account Number: [id number] Date of Birth/Sex: [birth year] (38 y.o. M) Treating RN: Army Melia Primary Care Mariko Nowakowski: [name] Other Clinician: Referring Lori [name]: [name] Treating Najma Bozarth/Extender: Melburn Hake, [name]: 17 Edema Assessment Assessed: [Left: No] [Right: No] Edema: [Left: N] [Right: o] Vascular Assessment Pulses: Dorsalis Pedis Palpable: [Left:Yes] Posterior Tibial Extremity colors, hair growth, and conditions: Extremity Color: [Left:Hyperpigmented] Hair Growth on Extremity: [Left:Yes] Temperature of Extremity: [Left:Warm] Capillary Refill: [Left:< 3 seconds] Toe Nail Assessment Left: Right: Thick: Yes Discolored: No Deformed: No Improper Length and Hygiene: No Electronic Signature(s) Signed: 07/15/2018 9:17:40 AM By: Army Melia Entered By: Army Melia on 07/13/2018 08:21:17 Mickelsen, [name] ([id number]) -------------------------------------------------------------------------------- Multi Wound Chart Details Patient Name: [name] Date of Service: 07/13/2018 8:00 AM Medical Record Number: [id number] Patient Account Number: [id number] Date of Birth/Sex: [birth year] (37 y.o.  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] [N/A:N/A] Comorbid History: [1:Sleep Apnea, Hypertension, Colitis] [N/A:N/A] Date Acquired: [1:11/18/2015] [N/A:N/A] Weeks of Treatment: [1:83] [N/A:N/A] Wound Status: [1:Open] [N/A:N/A] Measurements L x W x D [1:3.5x3.8x0.2] [N/A:N/A] (cm) Area (cm) : [1:10.446] [  N/A:N/A] Volume (cm) : [1:2.089] [N/A:N/A] % Reduction in Area: [1:-112.80%] [N/A:N/A] % Reduction in Volume: [1:46.80%] [N/A:N/A] Classification: [1:Full Thickness With Exposed Support Structures] [N/A:N/A] Exudate Amount: [1:Medium] [N/A:N/A] Exudate Type: [1:Purulent] [N/A:N/A] Exudate Color: [1:yellow, brown, green] [N/A:N/A] Wound Margin: [1:Distinct, outline attached] [N/A:N/A] Granulation Amount: [1:None Present (0%)] [N/A:N/A] Necrotic Amount: [1:Large (67-100%)] [N/A:N/A] Exposed Structures: [1:Fat Layer (Subcutaneous Tissue) Exposed: Yes Fascia: No Tendon: No Muscle: No Joint: No Bone: No] [N/A:N/A] Epithelialization: [1:Small (1-33%)] [N/A:N/A] Periwound Skin Texture: [1:Induration: Yes Scarring: Yes Excoriation: No Callus: No Crepitus: No Rash: No] [N/A:N/A] Periwound Skin Moisture: Dry/Scaly: Yes N/A N/A Maceration: No Periwound Skin Color: Erythema: Yes N/A N/A Hemosiderin Staining: Yes Atrophie Blanche: No Cyanosis: No Ecchymosis: No Mottled: No Pallor: No Rubor: No Temperature: No Abnormality N/A N/A Tenderness on Palpation: Yes N/A N/A Wound Preparation: Ulcer  Cleansing: N/A N/A Rinsed/Irrigated with Saline Topical Anesthetic Applied: Other: lidocaine 4% Treatment Notes Electronic Signature(s) Signed: 07/14/2018 11:13:25 AM By: Harold Barban Entered By: Harold Barban on 07/13/2018 08:27:41 ROMIE, TAY ([id number]) -------------------------------------------------------------------------------- Multi-Disciplinary Care Plan Details Patient Name: [name] Date of Service: 07/13/2018 8:00 AM Medical Record Number: [id number] Patient Account Number: [id number] Date of Birth/Sex: 10/02/1980 (38 y.o. M) Treating RN: Harold Barban Primary Care Alecxander Mainwaring: [name] Other Clinician: Referring Clary Boulais: [name] Treating Hayven Fatima/Extender: Melburn Hake, [name]: 46 Active Inactive Orientation to the Wound Care Program Nursing Diagnoses: Knowledge deficit related to the wound healing center program Goals: Patient/caregiver will verbalize understanding of the [facility] Date Initiated: 12/11/2016 Target Resolution Date: 03/13/2017 Goal Status: Active Interventions: Provide education on orientation to the wound center Notes: Venous Leg Ulcer Nursing Diagnoses: Knowledge deficit related to disease process and management Potential for venous Insuffiency (use before diagnosis confirmed) Goals: Non-invasive venous studies are completed as ordered Date Initiated: 12/11/2016 Target Resolution Date: 03/13/2017 Goal Status: Active Patient will maintain optimal edema control Date Initiated: 12/11/2016 Target Resolution Date: 03/13/2017 Goal Status: Active Patient/caregiver will verbalize understanding of disease process and disease management Date Initiated: 12/11/2016 Target Resolution Date: 03/13/2017 Goal Status: Active Verify adequate tissue perfusion prior to therapeutic compression application Date Initiated: 12/11/2016 Target Resolution Date: 03/13/2017 Goal Status:  Active Interventions: Assess peripheral edema status every visit. Compression as ordered Provide education on venous insufficiency Treatment Activities: DUFFY, DANTONIO ([id number]) Therapeutic compression applied : 12/11/2016 Notes: Wound/Skin Impairment Nursing Diagnoses: Impaired tissue integrity Knowledge deficit related to ulceration/compromised skin integrity Goals: Patient/caregiver will verbalize understanding of skin care regimen Date Initiated: 12/11/2016 Target Resolution Date: 03/13/2017 Goal Status: Active Ulcer/skin breakdown will have a volume reduction of 30% by week 4 Date Initiated: 12/11/2016 Target Resolution Date: 03/13/2017 Goal Status: Active Ulcer/skin breakdown will have a volume reduction of 50% by week 8 Date Initiated: 12/11/2016 Target Resolution Date: 03/13/2017 Goal Status: Active Ulcer/skin breakdown will have a volume reduction of 80% by week 12 Date Initiated: 12/11/2016 Target Resolution Date: 03/13/2017 Goal Status: Active Ulcer/skin breakdown will heal within 14 weeks Date Initiated: 12/11/2016 Target Resolution Date: 03/13/2017 Goal Status: Active Interventions: Assess patient/caregiver ability to obtain necessary supplies Assess patient/caregiver ability to perform ulcer/skin care regimen upon admission and as needed Assess ulceration(s) every visit Provide education on ulcer and skin care Treatment Activities: Skin care regimen initiated : 12/11/2016 Topical wound management initiated : 12/11/2016 Notes: Electronic Signature(s) Signed: 07/14/2018 11:13:25 AM By: Harold Barban Entered By: Harold Barban on 07/13/2018 08:27:28 [name] ([id number]) -------------------------------------------------------------------------------- Pain Assessment Details Patient Name: [name] Date of Service: 07/13/2018 8:00 AM Medical Record Number:  [id number] Patient Account Number: [id number] Date of Birth/Sex: June 08, 1980 (38 y.o. M) Treating RN:  Harold Barban Primary Care Adalaide Jaskolski: [name] Other Clinician: Referring Takeya Marquis: [name] Treating Dezmond Downie/Extender: Melburn Hake, [name]: 17 Active Problems Location of Pain Severity and Description of Pain Patient Has Paino No Site Locations Pain Management and Medication Current Pain Management: Electronic Signature(s) Signed: 07/13/2018 3:28:24 PM By: Lorine Bears RCP, RRT, CHT Signed: 07/14/2018 11:13:25 AM By: Harold Barban Entered By: Lorine Bears on 07/13/2018 08:10:11 [name] ([id number]) -------------------------------------------------------------------------------- Patient/Caregiver Education Details Patient Name: [name] Date of Service: 07/13/2018 8:00 AM Medical Record Number: [id number] Patient Account Number: [id number] Date of Birth/Gender: 1981-02-17 (38 y.o. M) Treating RN: Harold Barban Primary Care Physician: [name] Other Clinician: Referring Physician: [name] Treating Physician/Extender: Sharalyn Ink in Treatment: 66 Education Assessment Education Provided To: Patient Education Topics Provided Wound/Skin Impairment: Handouts: Caring for Your Ulcer Methods: Demonstration, Explain/Verbal Responses: State content correctly Electronic Signature(s) Signed: 07/14/2018 11:13:25 AM By: Harold Barban Entered By: Harold Barban on 07/13/2018 08:28:17 [name] ([id number]) -------------------------------------------------------------------------------- Wound Assessment Details Patient Name: [name] Date of Service: 07/13/2018 8:00 AM Medical Record Number: [id number] Patient Account Number: [id number] Date of Birth/Sex: 07-28-1980 (38 y.o. M) Treating RN: Army Melia Primary Care Dallie Patton: [name] Other Clinician: Referring Tamre Cass: [name] Treating Monroe Qin/Extender: Melburn Hake, [name]: 49 Wound  Status Wound Number: 1 Primary Etiology: Pyoderma Wound Location: Left Lower Leg - Lateral Wound Status: Open Wounding Event: Gradually Appeared Comorbid History: Sleep Apnea, Hypertension, Colitis Date Acquired: 11/18/2015 Weeks Of Treatment: 83 Clustered Wound: No Photos Photo Uploaded By: Army Melia on 07/14/2018 08:46:16 Wound Measurements Length: (cm) 3.5 Width: (cm) 3.8 Depth: (cm) 0.2 Area: (cm) 10.446 Volume: (cm) 2.089 % Reduction in Area: -112.8% % Reduction in Volume: 46.8% Epithelialization: Small (1-33%) Tunneling: No Undermining: No Wound Description Full Thickness With Exposed Support Foul Odo Classification: Structures Slough/F Wound Margin: Distinct, outline attached Exudate Medium Amount: Exudate Type: Purulent Exudate Color: yellow, brown, green r After Cleansing: No ibrino Yes Wound Bed Granulation Amount: None Present (0%) Exposed Structure Necrotic Amount: Large (67-100%) Fascia Exposed: No Necrotic Quality: Adherent Slough Fat Layer (Subcutaneous Tissue) Exposed: Yes Tendon Exposed: No Muscle Exposed: No Joint Exposed: No Bone Exposed: No Woolum, Arvon ([id number]) Periwound Skin Texture Texture Color No Abnormalities Noted: No No Abnormalities Noted: No Callus: No Atrophie Blanche: No Crepitus: No Cyanosis: No Excoriation: No Ecchymosis: No Induration: Yes Erythema: Yes Rash: No Hemosiderin Staining: Yes Scarring: Yes Mottled: No Pallor: No Moisture Rubor: No No Abnormalities Noted: No Dry / Scaly: Yes Temperature / Pain Maceration: No Temperature: No Abnormality Tenderness on Palpation: Yes Wound Preparation Ulcer Cleansing: Rinsed/Irrigated with Saline Topical Anesthetic Applied: Other: lidocaine 4%, Treatment Notes Wound #1 (Left, Lateral Lower Leg) Notes Santyl, BFD Electronic Signature(s) Signed: 07/15/2018 9:17:40 AM By: Army Melia Entered By: Army Melia on 07/13/2018 08:20:46 Mollenkopf, [name]  ([id number]) -------------------------------------------------------------------------------- Vitals Details Patient Name: [name] Date of Service: 07/13/2018 8:00 AM Medical Record Number: [id number] Patient Account Number: [id number] Date of Birth/Sex: [birth year] (38 y.o. M) Treating RN: Harold Barban Primary Care Elsi Stelzer: [name] Other Clinician: Referring Nicolae Vasek: [name] Treating Paddy Neis/Extender: Melburn Hake, [name]: 32 Vital Signs Time Taken: 08:10 Temperature (F): 98.2 Height (in): 71 Pulse (bpm): 73 Weight (lbs): 338 Respiratory Rate (breaths/min): 18 Body Mass Index (BMI): 47.1 Blood Pressure (mmHg): 119/61 Reference Range: 80 - 120 mg / dl Electronic Signature(s) Signed: 07/13/2018 3:28:24 PM  By: Lorine Bears RCP, RRT, CHT Entered By: Lorine Bears on 07/13/2018 08:13:20

## 2018-07-27 ENCOUNTER — Encounter: Payer: 59 | Attending: Physician Assistant | Admitting: Physician Assistant

## 2018-07-27 DIAGNOSIS — L539 Erythematous condition, unspecified: Secondary | ICD-10-CM | POA: Diagnosis not present

## 2018-07-27 DIAGNOSIS — Z823 Family history of stroke: Secondary | ICD-10-CM | POA: Diagnosis not present

## 2018-07-27 DIAGNOSIS — F17200 Nicotine dependence, unspecified, uncomplicated: Secondary | ICD-10-CM | POA: Insufficient documentation

## 2018-07-27 DIAGNOSIS — L280 Lichen simplex chronicus: Secondary | ICD-10-CM | POA: Insufficient documentation

## 2018-07-27 DIAGNOSIS — I10 Essential (primary) hypertension: Secondary | ICD-10-CM | POA: Insufficient documentation

## 2018-07-27 DIAGNOSIS — E11622 Type 2 diabetes mellitus with other skin ulcer: Secondary | ICD-10-CM | POA: Diagnosis not present

## 2018-07-27 DIAGNOSIS — L88 Pyoderma gangrenosum: Secondary | ICD-10-CM | POA: Insufficient documentation

## 2018-07-27 DIAGNOSIS — L97822 Non-pressure chronic ulcer of other part of left lower leg with fat layer exposed: Secondary | ICD-10-CM | POA: Diagnosis not present

## 2018-07-27 DIAGNOSIS — L97222 Non-pressure chronic ulcer of left calf with fat layer exposed: Secondary | ICD-10-CM | POA: Insufficient documentation

## 2018-07-27 DIAGNOSIS — I872 Venous insufficiency (chronic) (peripheral): Secondary | ICD-10-CM | POA: Diagnosis not present

## 2018-07-27 DIAGNOSIS — Z8249 Family history of ischemic heart disease and other diseases of the circulatory system: Secondary | ICD-10-CM | POA: Diagnosis not present

## 2018-07-27 DIAGNOSIS — K5190 Ulcerative colitis, unspecified, without complications: Secondary | ICD-10-CM | POA: Diagnosis not present

## 2018-07-28 NOTE — Progress Notes (Signed)
[name], [name] ([id number]) Visit Report for 07/27/2018 Chief Complaint Document Details Patient Name: [name], [name] Date of Service: 07/27/2018 8:00 AM Medical Record Number: [id number] Patient Account Number: [id number] Date of Birth/Sex: December 06, 1980 (37 y.o. M) Treating RN: Harold Barban Primary Care Provider: [name] Other Clinician: Referring Provider: [name] Treating Provider/Extender: Melburn Hake, Sindia Kowalczyk Weeks in Treatment: 52 Information Obtained from: Patient Chief Complaint He is here in follow up evaluation for LLE pyoderma ulcer Electronic Signature(s) Signed: 07/27/2018 5:00:45 PM By: Worthy Keeler PA-C Entered By: Worthy Keeler on 07/27/2018 08:22:58 AZEL, GUMINA ([id number]) -------------------------------------------------------------------------------- HPI Details Patient Name: [name] Date of Service: 07/27/2018 8:00 AM Medical Record Number: [id number] Patient Account Number: [id number] Date of Birth/Sex: [birth year] (37 y.o. M) Treating RN: Harold Barban Primary Care Provider: [name] Other Clinician: Referring Provider: [name] Treating Provider/Extender: Melburn Hake, Regis Wiland Weeks in Treatment: 88 History of Present Illness HPI Description: 12/04/16; 38 year old man who comes into the clinic today for review of a wound on the posterior left calf. He tells me that is been there for about a year. He is not a diabetic he does smoke half a pack per day. He was seen in the ER on 11/20/16 felt to have cellulitis around the wound and was given clindamycin. An x-ray did not show osteomyelitis. The patient initially tells me that he has a milk allergy that sets off a pruritic itching rash on his lower legs which she scratches incessantly and he thinks that's what may have set up the wound. He has been using various topical antibiotics and ointments without any effect. He works in a [employer] and is on his feet all day. He does not have a prior  history of wounds however he does have the rash on both lower legs the right arm and the ventral aspect of his left arm. These are excoriations and clearly have had scratching however there are of macular looking areas on both legs including a substantial larger area on the right leg. This does not have an underlying open area. There is no blistering. The patient tells me that 2 years ago in [place] in response to the rash on his legs he saw a dermatologist who told him he had a condition which may be pyoderma gangrenosum although I may be putting words into his mouth. He seemed to recognize this. 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 He is not in any treatment for this. He's had no recent travel 12/11/16; the patient arrives today with his wound and roughly the same condition we've been using silver alginate this is a deep punched out wound with some surrounding erythema but no tenderness. Biopsy I did did not show confirmed pyoderma gangrenosum suggested nonspecific inflammation and vasculitis but does not provide an actual description of what was seen by the pathologist. I'm really not able to understand this We have also received information from the patient's dermatologist in [place] notes from April 2016. This was a doctor Agarwal- antal. 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 He was prescribed topical steroid high potency under occlusion which helped but at this point the patient did not have a deep punched out wound. 12/18/16; the patient's wound is larger in terms of surface area however this surface looks better and there is less depth. The surrounding erythema also is better. The patient states that the wrap we put on came off 2 days ago when he has been using  his compression stockings. He we are in the process of getting a dermatology consult. 12/26/16 on evaluation today patient's left lower extremity wound shows evidence of  infection with surrounding erythema noted. He has been tolerating the dressing changes but states that he has noted more discomfort. There is a larger area of erythema surrounding the wound. No fevers, chills, nausea, or vomiting noted at this time. With that being said the wound still does have slough covering the surface. He is not allergic to any medication that he is aware of at this point. In regard to his right lower extremity he had several regions that are erythematous and pruritic he wonders if there's anything we can do to help that. 01/02/17 I reviewed patient's wound culture which was obtained his visit last week. He was placed on doxycycline at that point. Unfortunately that does not appear to be an antibiotic that would likely help with the situation however the pseudomonas noted on culture is sensitive to Cipro. Also unfortunately patient's wound seems to have a large compared to last week's evaluation. Not severely so but there are definitely increased measurements in general. He is continuing to have discomfort as well he writes this to be a seven out of 10. In fact he would prefer me not to perform any debridement today due to the fact that he is having discomfort and considering he has an active infection on the little reluctant to do so anyway. No fevers, chills, nausea, or vomiting noted at this time. 01/08/17; patient seems dermatology on September 5. I suspect dermatology will want the slides from the biopsy I did sent to their pathologist. I'm not sure if there is a way we can expedite that. In any case the culture I did before I left on vacation 3 weeks ago showed Pseudomonas he was given 10 days of Cipro and per her description of her intake nurses is actually somewhat better this week although the wound is quite a bit bigger than I remember the last time I saw this. He still has 3 more days of Cipro [name], [name] ([id number]) 01/21/17; dermatology appointment tomorrow. He has  completed the ciprofloxacin for Pseudomonas. Surface of the wound looks better however he is had some deterioration in the lesions on his right leg. Meantime the left lateral leg wound we will continue with sample 01/29/17; patient had his dermatology appointment but I can't yet see that note. He is completed his antibiotics. The wound is more superficial but considerably larger in circumferential area than when he came in. This is in his left lateral calf. He also has swollen erythematous areas with superficial wounds on the right leg and small papular areas on both arms. There apparently areas in her his upper thighs and buttocks I did not look at those. Dermatology biopsied the right leg. Hopefully will have their input next week. 02/05/17; patient went back to see his dermatologist who told him that he had a "scratching problem" as well as staph. He is now on a 30 day course of doxycycline and I believe she gave him triamcinolone cream to the right leg areas to help with the itching [not exactly sure but probably triamcinolone]. She apparently looked at the left lateral leg wound although this was not rebiopsied and I think felt to be ultimately part of the same pathogenesis. He is using sample border foam and changing nevus himself. He now has a new open area on the right posterior leg which was his biopsy site I don't  have any of the dermatology notes 02/12/17; we put the patient in compression last week with SANTYL to the wound on the left leg and the biopsy. Edema is much better and the depth of the wound is now at level of skin. Area is still the same oBiopsy site on the right lateral leg we've also been using santyl with a border foam dressing and he is changing this himself. 02/19/17; Using silver alginate started last week to both the substantial left leg wound and the biopsy site on the right wound. He is tolerating compression well. Has a an appointment with his primary M.D. tomorrow  wondering about diuretics although I'm wondering if the edema problem is actually lymphedema 02/26/17; the patient has been to see his primary doctor Dr. Jerrel Ivory at [facility] our primary care. She started him on Lasix 20 mg and this seems to have helped with the edema. However we are not making substantial change with the left lateral calf wound and inflammation. The biopsy site on the right leg also looks stable but not really all that different. 03/12/17; the patient has been to see vein and vascular Dr. Lucky Cowboy. He has had venous reflux studies I have not reviewed these. I did get a call from his dermatology office. 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 They therefore recommended clobetasol and tetracycline i.e. 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 03/26/17; apparently the patient just had reflux studies not an appointment with Dr. dew. She arrives in clinic today having applied clobetasol for 2-3 weeks. He notes over the last 2-3 days excessive drainage having to change the dressing 3-4 times a day and also expanding erythema. 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 Lillia Carmel at [facility] dermatology locally who kindly saw him at my request. 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 My concern was the degree of erythema medially extending from his left leg wound which was either inflammation from  pyoderma or cellulitis. I put him on Augmentin however culture of the wound showed Pseudomonas which is quinolone sensitive. I really don't believe he has cellulitis however in view of everything I will continue and give him a course of Cipro. He is also on doxycycline as an immune modulator for the pyoderma. In addition to his original wound on the left lateral leg with surrounding erythema he has a wound on the right posterior calf which was an original biopsy site done by dermatology. 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 Saw Dr. Lillia Carmel yesterday. He has been using topical antibiotics to both wound areas his original wound on the left and the biopsies/pathergy area on the right. There is definitely some improvement in the inflammation around the wound on the right although the patient states he has increasing sensitivity of the wounds. He is on prednisone 60 and doxycycline 1 as prescribed by Dr. Lillia Carmel. He is covering the topical  antibiotic with gauze and putting this in his own compression stocks and changing this daily. He states that Dr. Lottie Rater did a culture of the left leg wound yesterday 05/07/17; pyoderma gangrenosum. The patient saw Dr. Lillia Carmel yesterday and has a follow-up with her in one month. He is still using topical antibiotics to both wounds although he can't recall exactly what type. He is still on prednisone 60 mg. Dr. Lillia Carmel stated that the doxycycline could stop if we were in agreement. 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 The right medial leg was induced by biopsy/pathergy. The area on the right is essentially healed. 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 The right medial leg has closed and  remains closed. He is still on prednisone 60. oHe tells me he missed his last dermatology appointment with Dr. Lillia Carmel but will make another appointment. He reports that her blood sugar at a recent screen in [place] was high 200's. He was 180 today. He is more cushingoid blood pressure is Komorowski, Tylerjames ([id number]) up a bit. I think he is going to require still much longer prednisone perhaps another 3 months before attempting to taper. In the meantime his wound is a lot better. Smaller. He is cleaning this off daily and applying topical antibiotics. When he was last in the clinic I thought about changing to [facility] and actually put in a couple of calls to dermatology although probably not during their business hours. 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 He continues on oral prednisone. He has been using triple antibiotic ointment. There is surface debris and we will transition to [facility] and have him return in 2 weeks. He has lost 30 pounds since his last appointment with lifestyle modification. He may benefit from topical steroid cream for treatment this can be considered at a later date. 08/22/17 on evaluation today patient appears to actually be doing rather well in regard to his left lateral lower extremity ulcer. He has actually been managed by Dr. Dellia Nims most recently. Patient is currently on oral steroids at this time. This seems to have been of benefit for him. Nonetheless his last visit was actually with Leah on 08/06/17. Currently he is not utilizing any topical steroid creams although this could be of benefit as well. No fevers, chills, nausea, or vomiting noted at this time. 09/05/17 on evaluation today patient appears to be doing better in regard to his left lateral lower extremity ulcer. He has been tolerating the dressing changes without complication.  He is using Santyl with good effect. Overall I'm very pleased with how things are standing at this point. Patient likewise is happy that this is doing better. 09/19/17 on evaluation today patient actually appears to be doing rather well in regard to his left lateral lower extremity ulcer. 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 He's not having any significant pain. 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 He has been tolerating the Santyl without complication and in general I feel like he's making good  progress. 10/17/17 on evaluation today patient appears to be doing very well in regard to his left lateral lower surety ulcer. He has been tolerating the dressing changes without complication. There does not appear to be any evidence of infection he's alternating the Santyl and the triple antibiotic ointment every other day this seems to be doing well for him. 11/03/17 on evaluation today patient appears to be doing very well in regard to his left lateral lower extremity ulcer. He is been tolerating the dressing changes without complication which is good news. Fortunately there does not appear to be any evidence of infection which is also great news. Overall is doing excellent they are starting to taper down on the prednisone is down to 40 mg at this point it also started topical clobetasol for him. 11/17/17 on evaluation today patient appears to be doing well in regard to his left lateral lower surety ulcer. He's been tolerating the dressing changes without complication. He does note that he is having no pain, no excessive drainage or discharge, and overall he feels like things are going about how he would expect and hope they would. Overall he seems to have no evidence of infection at this time in my opinion which is  good news. 12/04/17-He is seen in follow-up evaluation for right lateral lower extremity ulcer. He has been applying topical steroid cream. Today's measurement show slight increase in size. Over the next 2 weeks we will transition to every other day Santyl and steroid cream. He has been encouraged to monitor for changes and notify clinic with any concerns 12/15/17 on evaluation today patient's left lateral motion the ulcer and fortunately is doing worse again at this point. This just since last week to this week has close to doubled in size according to the patient. I did not seeing last week's I do not have a visual to compare this to in our system was also down so we do not have all the charts and at this point. Nonetheless it does have me somewhat concerned in regard to the fact that again he was worried enough about it he has contact the dermatology that placed them back on the full strength, 50 mg a day of the prednisone that he was taken previous. He continues to alternate using clobetasol along with Santyl at this point. He is obviously somewhat frustrated. 12/22/17 on evaluation today patient appears to be doing a little worse compared to last evaluation. Unfortunately the wound is a little deeper and slightly larger than the last week's evaluation. With that being said he has made some progress in regard to the irritation surrounding at this time unfortunately despite that progress that's been made he still has a significant issue going on here. 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 For that reason I discussed with him today the possibility of perform a wound culture to ensure there's nothing overtly infected. 01/06/18 on evaluation today patient's wound is larger and deeper than previously evaluated. With that being said it did appear that  his wound was infected after my last evaluation with him. Subsequently I did end up prescribing a prescription for Bactrim DS which she has been taking and having no complication with. Fortunately there does not appear to be any evidence of Brassfield, Toddy ([id number]) infection at this point in time as far as anything spreading, no want to  touch, and overall I feel like things are showing signs of improvement. 01/13/18 on evaluation today patient appears to be even a little larger and deeper than last time. There still muscle exposed in the base of the wound. Nonetheless he does appear to be less erythematous I do believe inflammation is calming down also believe the infection looks like it's probably resolved at this time based on what I'm seeing. No fevers, chills, nausea, or vomiting noted at this time. 01/30/18 on evaluation today patient actually appears to visually look better for the most part. Unfortunately those visually this looks better he does seem to potentially have what may be an abscess in the muscle that has been noted in the central portion of the wound. This is the first time that I have noted what appears to be fluctuance in the central portion of the muscle. With that being said I'm somewhat more concerned about the fact that this might indicate an abscess formation at this location. I do believe that an ultrasound would be appropriate. This is likely something we need to try to do as soon as possible. He has been switch to mupirocin ointment and he is no longer using the steroid ointment as prescribed by dermatology he sees them again next week he's been decreased from 60 to 40 mg of prednisone. 03/09/18 on evaluation today patient actually appears to be doing a little better compared to last time I saw him. There's not as much erythema surrounding the wound itself. He I did review his most recent infectious disease note which was dated 02/24/18. He saw Dr. Michel Bickers in  [place]. 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 Patient is now off of antibiotics and they are continually observing this. There seems to be no change in the past two weeks in my pinion based on what the patient says and what I see today compared to what Dr. Megan Salon likely saw two weeks ago. No fevers, chills, nausea, or vomiting noted at this time. 03/23/18 on evaluation today patient's wound actually appears to be showing signs of improvement which is good news. He is currently still on the Dapsone. He is also working on tapering the prednisone to get off of this and Dr. Lottie Rater is working with him in this regard. Nonetheless overall I feel like the wound is doing well it does appear based on the infectious disease note that I reviewed from Dr. 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 I did also review the results of his ultrasound of left lower extremity which revealed there was a dentist tissue in the base of the wound without an abscess noted. 04/06/18 on evaluation today the patient's left lateral lower extremity ulcer actually appears to be doing fairly well which is excellent news. There does not appear to be any evidence of infection at this time which is also great news. Overall he still does have a significantly large ulceration although little by little he seems to be making progress. He is down to 10 mg a day of the prednisone. 04/20/18 on evaluation today patient actually appears to be doing excellent at this time in regard to his left lower extremity ulcer. He's making signs of good progress unfortunately this is taking much longer than we would really like  to see but nonetheless he is making progress. Fortunately there does not appear to be any evidence of infection at this  time. No fevers, chills, nausea, or vomiting noted at this time. The patient has not been using the Santyl due to the cost he hadn't got in this field yet. He's mainly been using the antibiotic ointment topically. Subsequently he also tells me that he really has not been scrubbing in the shower I think this would be helpful again as I told him it doesn't have to be anything too aggressive to even make it believe just enough to keep it free of some of the loose slough and biofilm on the wound surface. 05/11/18 on evaluation today patient's wound appears to be making slow but sure progress in regard to the left lateral lower extremity ulcer. He is been tolerating the dressing changes without complication. Fortunately there does not appear to be any evidence of infection at this time. He is still just using triple antibiotic ointment along with clobetasol occasionally over the area. He never got the Santyl and really does not seem to intend to in my pinion. 06/01/18 on evaluation today patient appears to be doing a little better in regard to his left lateral lower extremity ulcer. He states that overall he does not feel like he is doing as well with the Dapsone as he did with the prednisone. Nonetheless he sees his dermatologist later today and is gonna talk to them about the possibility of going back on the prednisone. Overall again I believe that the wound would be better if you would utilize Santyl but he really does not seem to be interested in going back to the [place] at this point. He has been using triple antibiotic ointment. 06/15/18 on evaluation today patient's wound actually appears to be doing about the same at this point. Fortunately there is no signs of infection at this time. He has made slight improvements although he continues to not really want to clean the wound bed at this point. He states that he just doesn't mess with it he doesn't want to cause any problems with everything else  he has going on. He has been on medication, antibiotics as prescribed by his dermatologist, for a staff infection of his lower extremities which is really drying out now and looking much better he tells me. Fortunately there is no sign of overall infection. [name], [name] ([id number]) 06/29/18 on evaluation today patient appears to be doing well in regard to his left lateral lower surety ulcer all things considering. Fortunately his staff infection seems to be greatly improved compared to previous. He has no signs of infection and this is drying up quite nicely. He is still the doxycycline for this is no longer on cental, Dapsone, or any of the other medications. His dermatologist has recommended possibility of an infusion but right now he does not want to proceed with that. 07/13/18 on evaluation today patient appears to be doing about the same in regard to his left lateral lower surety ulcer. Fortunately there's no signs of infection at this time which is great news. Unfortunately he still builds up a significant amount of Slough/biofilm of the surface of the wound he still is not really cleaning this as he should be appropriately. Again I'm able to easily with saline and gauze remove the majority of this on the surface which if you would do this at home would likely be a dramatic improvement for him as far as getting the area  to improve. Nonetheless overall I still feel like he is making progress is just very slow. I think Santyl will be of benefit for him as well. Still he has not gotten this as of this point. 07/27/18 on evaluation today patient actually appears to be doing little worse in regards of the erythema around the periwound region of the wound he also tells me that he's been having more drainage currently compared to what he was experiencing last time I saw him. He states not quite as bad as what he had because this was infected previously but nonetheless is still appears to be doing  poorly. Fortunately there is no evidence of systemic infection at this point. The patient tells me that he is not going to be able to afford the Santyl. He is still waiting to hear about the infusion therapy with his dermatologist. Apparently she wants an updated colonoscopy first. Electronic Signature(s) Signed: 07/27/2018 5:00:45 PM By: Worthy Keeler PA-C Entered By: Worthy Keeler on 07/27/2018 08:39:34 [name], [name] ([id number]) -------------------------------------------------------------------------------- Physical Exam Details Patient Name: [name] Date of Service: 07/27/2018 8:00 AM Medical Record Number: [id number] Patient Account Number: [id number] Date of Birth/Sex: 28-Apr-1981 (37 y.o. M) Treating RN: Harold Barban Primary Care Provider: [name] Other Clinician: Referring Provider: [name] Treating Provider/Extender: Melburn Hake, Braxten Memmer Weeks in Treatment: 2 Constitutional Well-nourished and well-hydrated in no acute distress. Respiratory normal breathing without difficulty. clear to auscultation bilaterally. Cardiovascular regular rate and rhythm with normal S1, S2. Psychiatric this patient is able to make decisions and demonstrates good insight into disease process. Alert and Oriented x 3. pleasant and cooperative. Notes Patient's wound bed currently shows evidence of slough covering the surface of the wound and again there does appear to be some granulation at the base of the wound especially in the more anterior portion or ads of the wound bed. Nonetheless still there is significant erythema surrounding the wound bed which is different compared to last evaluation. This has me at least slightly concerned about the possibility of there being an impending infection trying to brew. Electronic Signature(s) Signed: 07/27/2018 5:00:45 PM By: Worthy Keeler PA-C Entered By: Worthy Keeler on 07/27/2018 08:40:09 [name], [name]  ([id number]) -------------------------------------------------------------------------------- Physician Orders Details Patient Name: [name] Date of Service: 07/27/2018 8:00 AM Medical Record Number: [id number] Patient Account Number: [id number] Date of Birth/Sex: May 11, 1981 (37 y.o. 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 Anesthetic (add to Medication List) Wound #1 Left,Lateral Lower Leg o Topical Lidocaine 4% cream applied to wound bed prior to debridement (In Clinic Only). Primary Wound Dressing Wound #1 Left,Lateral Lower Leg o Other: - Mupirocin mix half and half with clobetasol Secondary Dressing Wound #1 Left,Lateral Lower Leg o Boardered Foam Dressing Dressing Change Frequency Wound #1 Left,Lateral Lower Leg o Change dressing every day. Follow-up Appointments Wound #1 Left,Lateral Lower Leg o Return Appointment in 2 weeks. Edema Control Wound #1 Left,Lateral Lower Leg o Patient to wear own compression stockings o Elevate legs to the level of the heart and pump ankles as often as possible Medications-please add to medication list. Wound #1 Left,Lateral  Lower Leg o P.O. Antibiotics - Prescribed by Dermatology for Staph Infection VENCENT, [name] ([id number]) Electronic Signature(s) Signed: 07/27/2018 10:54:38 AM By: Harold Barban Signed: 07/27/2018 5:00:45 PM By: Worthy Keeler PA-C Entered By: Harold Barban on 07/27/2018 08:35:46 [name], [name]  ([id number]) -------------------------------------------------------------------------------- Problem List Details Patient Name: [name] Date of Service: 07/27/2018 8:00 AM Medical Record Number: [id number] Patient Account Number: [id number] Date of Birth/Sex: 1980/08/10 (37 y.o. M) Treating RN: Harold Barban Primary Care Provider: [name] Other Clinician: Referring Provider: [name] Treating Provider/Extender: Melburn Hake, Euva Rundell Weeks in Treatment: 68 Active Problems ICD-10 Evaluated Encounter Code Description Active Date Today Diagnosis L97.222 Non-pressure chronic ulcer of left calf with fat layer exposed 12/04/2016 No Yes L88 Pyoderma gangrenosum 03/26/2017 No Yes I87.2 Venous insufficiency (chronic) (peripheral) 12/04/2016 No Yes Inactive Problems ICD-10 Code Description Active Date Inactive Date L97.213 Non-pressure chronic ulcer of right calf with necrosis of muscle 04/02/2017 04/02/2017 Resolved Problems Electronic Signature(s) Signed: 07/27/2018 5:00:45 PM By: Worthy Keeler PA-C Entered By: Worthy Keeler on 07/27/2018 08:22:33 [name] ([id number]) -------------------------------------------------------------------------------- Progress Note Details Patient Name: [name] Date of Service: 07/27/2018 8:00 AM Medical Record Number: [id number] Patient Account Number: [id number] Date of Birth/Sex: [birth year] (37 y.o. M) Treating RN: Harold Barban Primary Care Provider: [name] Other Clinician: Referring Provider: [name] Treating Provider/Extender: Melburn Hake, Kaydin Labo Weeks in Treatment: 11 Subjective Chief Complaint Information obtained from Patient He is here in follow up evaluation for LLE pyoderma ulcer History of Present Illness (HPI) 12/04/16; 38 year old man who comes into the clinic today for review of a wound on the posterior left calf. He tells me that is been there for about a year. He is not a diabetic he does smoke  half a pack per day. He was seen in the ER on 11/20/16 felt to have cellulitis around the wound and was given clindamycin. An x-ray did not show osteomyelitis. The patient initially tells me that he has a milk allergy that sets off a pruritic itching rash on his lower legs which she scratches incessantly and he thinks that's what may have set up the wound. He has been using various topical antibiotics and ointments without any effect. He works in a [employer] and is on his feet all day. He does not have a prior history of wounds however he does have the rash on both lower legs the right arm and the ventral aspect of his left arm. These are excoriations and clearly have had scratching however there are of macular looking areas on both legs including a substantial larger area on the right leg. This does not have an underlying open area. There is no blistering. The patient tells me that 2 years ago in [place] in response to the rash on his legs he saw a dermatologist who told him he had a condition which may be pyoderma gangrenosum although I may be putting words into his mouth. He seemed to recognize this. 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 He is not in any treatment for this. He's had no recent travel 12/11/16; the patient arrives today with his wound and roughly the same condition we've been using silver alginate this is a deep punched out wound with some surrounding erythema but no tenderness. Biopsy I did did not show confirmed pyoderma gangrenosum suggested nonspecific inflammation and vasculitis but does not provide an actual description of what was seen by the pathologist. I'm really not able to understand this  We have also received information from the patient's dermatologist in [place] notes from April 2016. This was a doctor Agarwal- antal. 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 He was prescribed topical steroid high potency  under occlusion which helped but at this point the patient did not have a deep punched out wound. 12/18/16; the patient's wound is larger in terms of surface area however this surface looks better and there is less depth. The surrounding erythema also is better. The patient states that the wrap we put on came off 2 days ago when he has been using his compression stockings. He we are in the process of getting a dermatology consult. 12/26/16 on evaluation today patient's left lower extremity wound shows evidence of infection with surrounding erythema noted. He has been tolerating the dressing changes but states that he has noted more discomfort. There is a larger area of erythema surrounding the wound. No fevers, chills, nausea, or vomiting noted at this time. With that being said the wound still does have slough covering the surface. He is not allergic to any medication that he is aware of at this point. In regard to his right lower extremity he had several regions that are erythematous and pruritic he wonders if there's anything we can do to help that. 01/02/17 I reviewed patient's wound culture which was obtained his visit last week. He was placed on doxycycline at that point. Unfortunately that does not appear to be an antibiotic that would likely help with the situation however the pseudomonas noted on culture is sensitive to Cipro. Also unfortunately patient's wound seems to have a large compared to last week's evaluation. Not severely so but there are definitely increased measurements in general. He is continuing to have discomfort as well he writes this to be a seven out of 10. In fact he would prefer me not to perform any debridement today due to the fact that he is having discomfort and considering he has an active infection on the little reluctant to do so anyway. No fevers, Fairclough, Gaudencio ([id number]) chills, nausea, or vomiting noted at this time. 01/08/17; patient seems dermatology on  September 5. I suspect dermatology will want the slides from the biopsy I did sent to their pathologist. I'm not sure if there is a way we can expedite that. In any case the culture I did before I left on vacation 3 weeks ago showed Pseudomonas he was given 10 days of Cipro and per her description of her intake nurses is actually somewhat better this week although the wound is quite a bit bigger than I remember the last time I saw this. He still has 3 more days of Cipro 01/21/17; dermatology appointment tomorrow. He has completed the ciprofloxacin for Pseudomonas. Surface of the wound looks better however he is had some deterioration in the lesions on his right leg. Meantime the left lateral leg wound we will continue with sample 01/29/17; patient had his dermatology appointment but I can't yet see that note. He is completed his antibiotics. The wound is more superficial but considerably larger in circumferential area than when he came in. This is in his left lateral calf. He also has swollen erythematous areas with superficial wounds on the right leg and small papular areas on both arms. There apparently areas in her his upper thighs and buttocks I did not look at those. Dermatology biopsied the right leg. Hopefully will have their input next week. 02/05/17; patient went back to see his dermatologist who  told him that he had a "scratching problem" as well as staph. He is now on a 30 day course of doxycycline and I believe she gave him triamcinolone cream to the right leg areas to help with the itching [not exactly sure but probably triamcinolone]. She apparently looked at the left lateral leg wound although this was not rebiopsied and I think felt to be ultimately part of the same pathogenesis. He is using sample border foam and changing nevus himself. He now has a new open area on the right posterior leg which was his biopsy site I don't have any of the dermatology notes 02/12/17; we put the patient  in compression last week with SANTYL to the wound on the left leg and the biopsy. Edema is much better and the depth of the wound is now at level of skin. Area is still the same Biopsy site on the right lateral leg we've also been using santyl with a border foam dressing and he is changing this himself. 02/19/17; Using silver alginate started last week to both the substantial left leg wound and the biopsy site on the right wound. He is tolerating compression well. Has a an appointment with his primary M.D. tomorrow wondering about diuretics although I'm wondering if the edema problem is actually lymphedema 02/26/17; the patient has been to see his primary doctor Dr. Jerrel Ivory at [facility] our primary care. She started him on Lasix 20 mg and this seems to have helped with the edema. However we are not making substantial change with the left lateral calf wound and inflammation. The biopsy site on the right leg also looks stable but not really all that different. 03/12/17; the patient has been to see vein and vascular Dr. Lucky Cowboy. He has had venous reflux studies I have not reviewed these. I did get a call from his dermatology office. 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 They therefore recommended clobetasol and tetracycline i.e. 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 03/26/17; apparently the patient just had reflux studies not an appointment with Dr. dew. She arrives in clinic today having applied clobetasol for 2-3 weeks. He notes over the last 2-3 days excessive drainage having to change the dressing 3-4 times a day and also expanding erythema. He states the expanding erythema seems to come and go and was last this red  was earlier in the month.he is on doxycycline 150 mg twice a day as an anti-inflammatory systemic therapy for possible pyoderma gangrenosum along with the topical clobetasol 04/02/17; the patient was seen last week by Dr. Lillia Carmel at [facility] dermatology locally who kindly saw him at my request. 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 My concern was the degree of erythema medially extending from his left leg wound which was either inflammation from pyoderma or cellulitis. I put him on Augmentin however culture of the wound showed Pseudomonas which is quinolone sensitive. I really don't believe he has cellulitis however in view of everything I will continue and give him a course of Cipro. He is also on doxycycline as an immune modulator for the pyoderma. In addition to his original wound on the left lateral leg with surrounding  erythema he has a wound on the right posterior calf which was an original biopsy site done by dermatology. 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 Saw Dr. Lillia Carmel yesterday. He has been using topical antibiotics to both wound areas his original wound on the left and the biopsies/pathergy area on the right. There is definitely some improvement in the inflammation around the wound on the right although the patient states he has increasing sensitivity of the wounds. He is on prednisone 60 and doxycycline 1 as prescribed by Dr. Lillia Carmel. He is covering the topical antibiotic with gauze and putting this in his own compression stocks and changing this daily. He states that Dr. Lottie Rater did a culture of the left leg wound yesterday 05/07/17; pyoderma gangrenosum. The patient saw Dr. Lillia Carmel yesterday and has a follow-up with her in one month. He is still using topical antibiotics to both wounds although he can't recall exactly what type. He is still on  prednisone 60 mg. Dr. Lillia Carmel stated that the doxycycline could stop if we were in agreement. 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 The right medial leg was induced by [name] ([id number]) biopsy/pathergy. The area on the right is essentially healed. 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 The right medial leg has closed and remains closed. He is still on prednisone 60. He tells me he missed his last dermatology appointment with Dr. Lillia Carmel but will make another appointment. He reports that her blood sugar at a recent screen in [place] was high 200's. He was 180 today. 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 I think he is going to require still much longer prednisone perhaps another 3 months before attempting to taper. In the meantime his wound is a lot better. Smaller. He is cleaning this off daily and applying topical antibiotics. When he was last in the clinic I thought about changing to [facility] and actually put in a couple of calls to dermatology although probably not during their business hours. 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 He continues on oral prednisone. He has been using triple antibiotic ointment. There is surface debris and we will transition to [facility] and have him return in 2 weeks. He has lost 30 pounds since his last appointment with lifestyle modification. He may benefit from topical steroid cream for treatment this can be considered at a later date. 08/22/17 on evaluation today patient appears to actually be doing rather well in regard to his left lateral lower extremity ulcer. He has actually been managed by Dr. Dellia Nims  most recently. Patient is currently on oral steroids at this time. This seems to have been of benefit for him. Nonetheless his last visit was actually with Leah on 08/06/17. Currently he is not utilizing any topical steroid creams although this could be of benefit as well. No fevers, chills, nausea, or vomiting noted at this time. 09/05/17 on evaluation today patient appears to be doing better in regard to his left lateral lower extremity ulcer. He has been tolerating the dressing changes without complication. He is using Santyl with good effect. Overall I'm very pleased with how things are standing  at this point. Patient likewise is happy that this is doing better. 09/19/17 on evaluation today patient actually appears to be doing rather well in regard to his left lateral lower extremity ulcer. 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 He's not having any significant pain. 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 He has been tolerating the Santyl without complication and in general I feel like he's making good progress. 10/17/17 on evaluation today patient appears to be doing very well in regard to his left lateral lower surety ulcer. He has been tolerating the dressing changes without complication. There does not appear to be any evidence of infection he's alternating the Santyl and the triple antibiotic ointment every other day this seems to be doing well for him. 11/03/17 on evaluation today patient appears to be doing very well in regard to his left lateral lower extremity ulcer. He is been tolerating the dressing changes without complication which is good news. Fortunately there does not appear to be any evidence of infection which is also great news. Overall is doing excellent they are starting to taper down on  the prednisone is down to 40 mg at this point it also started topical clobetasol for him. 11/17/17 on evaluation today patient appears to be doing well in regard to his left lateral lower surety ulcer. He's been tolerating the dressing changes without complication. He does note that he is having no pain, no excessive drainage or discharge, and overall he feels like things are going about how he would expect and hope they would. Overall he seems to have no evidence of infection at this time in my opinion which is good news. 12/04/17-He is seen in follow-up evaluation for right lateral lower extremity ulcer. He has been applying topical steroid cream. Today's measurement show slight increase in size. Over the next 2 weeks we will transition to every other day Santyl and steroid cream. He has been encouraged to monitor for changes and notify clinic with any concerns 12/15/17 on evaluation today patient's left lateral motion the ulcer and fortunately is doing worse again at this point. This just since last week to this week has close to doubled in size according to the patient. I did not seeing last week's I do not have a visual to compare this to in our system was also down so we do not have all the charts and at this point. Nonetheless it does have me somewhat concerned in regard to the fact that again he was worried enough about it he has contact the dermatology that placed them back on the full strength, 50 mg a day of the prednisone that he was taken previous. He continues to alternate using clobetasol along with Santyl at this point. He is obviously somewhat frustrated. 12/22/17 on evaluation today patient appears to be doing a little worse compared to last evaluation. Unfortunately the wound is a little deeper and slightly larger than the last week's evaluation. With that being said he has made some progress in regard to the irritation surrounding at this time unfortunately despite that progress that's  been made he still has a significant issue going on here. I'm not certain that he is having really any true infection at this time although with the Pyoderma gangrenosum it can Basinski, Murat ([id number]) sometimes be difficult to differentiate  infection versus just inflammation. For that reason I discussed with him today the possibility of perform a wound culture to ensure there's nothing overtly infected. 01/06/18 on evaluation today patient's wound is larger and deeper than previously evaluated. With that being said it did appear that his wound was infected after my last evaluation with him. Subsequently I did end up prescribing a prescription for Bactrim DS which she has been taking and having no complication with. Fortunately there does not appear to be any evidence of infection at this point in time as far as anything spreading, no want to touch, and overall I feel like things are showing signs of improvement. 01/13/18 on evaluation today patient appears to be even a little larger and deeper than last time. There still muscle exposed in the base of the wound. Nonetheless he does appear to be less erythematous I do believe inflammation is calming down also believe the infection looks like it's probably resolved at this time based on what I'm seeing. No fevers, chills, nausea, or vomiting noted at this time. 01/30/18 on evaluation today patient actually appears to visually look better for the most part. Unfortunately those visually this looks better he does seem to potentially have what may be an abscess in the muscle that has been noted in the central portion of the wound. This is the first time that I have noted what appears to be fluctuance in the central portion of the muscle. With that being said I'm somewhat more concerned about the fact that this might indicate an abscess formation at this location. I do believe that an ultrasound would be appropriate. This is likely something we need to try  to do as soon as possible. He has been switch to mupirocin ointment and he is no longer using the steroid ointment as prescribed by dermatology he sees them again next week he's been decreased from 60 to 40 mg of prednisone. 03/09/18 on evaluation today patient actually appears to be doing a little better compared to last time I saw him. There's not as much erythema surrounding the wound itself. He I did review his most recent infectious disease note which was dated 02/24/18. He saw Dr. Michel Bickers in [place]. 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 Patient is now off of antibiotics and they are continually observing this. There seems to be no change in the past two weeks in my pinion based on what the patient says and what I see today compared to what Dr. Megan Salon likely saw two weeks ago. No fevers, chills, nausea, or vomiting noted at this time. 03/23/18 on evaluation today patient's wound actually appears to be showing signs of improvement which is good news. He is currently still on the Dapsone. He is also working on tapering the prednisone to get off of this and Dr. Lottie Rater is working with him in this regard. Nonetheless overall I feel like the wound is doing well it does appear based on the infectious disease note that I reviewed from Dr. 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 I did also review the results of his ultrasound of left lower extremity which revealed there was a dentist tissue in the base of the wound without an abscess noted. 04/06/18 on evaluation today the patient's left lateral  lower extremity ulcer actually appears to be doing fairly well which is excellent news. There does not appear to be any evidence of infection at this time which is also great news. Overall he  still does have a significantly large ulceration although little by little he seems to be making progress. He is down to 10 mg a day of the prednisone. 04/20/18 on evaluation today patient actually appears to be doing excellent at this time in regard to his left lower extremity ulcer. He's making signs of good progress unfortunately this is taking much longer than we would really like to see but nonetheless he is making progress. Fortunately there does not appear to be any evidence of infection at this time. No fevers, chills, nausea, or vomiting noted at this time. The patient has not been using the Santyl due to the cost he hadn't got in this field yet. He's mainly been using the antibiotic ointment topically. Subsequently he also tells me that he really has not been scrubbing in the shower I think this would be helpful again as I told him it doesn't have to be anything too aggressive to even make it believe just enough to keep it free of some of the loose slough and biofilm on the wound surface. 05/11/18 on evaluation today patient's wound appears to be making slow but sure progress in regard to the left lateral lower extremity ulcer. He is been tolerating the dressing changes without complication. Fortunately there does not appear to be any evidence of infection at this time. He is still just using triple antibiotic ointment along with clobetasol occasionally over the area. He never got the Santyl and really does not seem to intend to in my pinion. 06/01/18 on evaluation today patient appears to be doing a little better in regard to his left lateral lower extremity ulcer. He states that overall he does not feel like he is doing as well with the Dapsone as he did with the prednisone. Nonetheless he sees his dermatologist later today and is gonna talk to them about the possibility of going back on the prednisone. Overall again I believe that the wound would be better if you would utilize Santyl but  he really does not seem to be interested in going back to the [place] at this point. He has been using triple antibiotic ointment. [name], [name] ([id number]) 06/15/18 on evaluation today patient's wound actually appears to be doing about the same at this point. Fortunately there is no signs of infection at this time. He has made slight improvements although he continues to not really want to clean the wound bed at this point. He states that he just doesn't mess with it he doesn't want to cause any problems with everything else he has going on. He has been on medication, antibiotics as prescribed by his dermatologist, for a staff infection of his lower extremities which is really drying out now and looking much better he tells me. Fortunately there is no sign of overall infection. 06/29/18 on evaluation today patient appears to be doing well in regard to his left lateral lower surety ulcer all things considering. Fortunately his staff infection seems to be greatly improved compared to previous. He has no signs of infection and this is drying up quite nicely. He is still the doxycycline for this is no longer on cental, Dapsone, or any of the other medications. His dermatologist has recommended possibility of an infusion but right now he does not want to proceed with that.  07/13/18 on evaluation today patient appears to be doing about the same in regard to his left lateral lower surety ulcer. Fortunately there's no signs of infection at this time which is great news. Unfortunately he still builds up a significant amount of Slough/biofilm of the surface of the wound he still is not really cleaning this as he should be appropriately. Again I'm able to easily with saline and gauze remove the majority of this on the surface which if you would do this at home would likely be a dramatic improvement for him as far as getting the area to improve. Nonetheless overall I still feel like he is making progress is just  very slow. I think Santyl will be of benefit for him as well. Still he has not gotten this as of this point. 07/27/18 on evaluation today patient actually appears to be doing little worse in regards of the erythema around the periwound region of the wound he also tells me that he's been having more drainage currently compared to what he was experiencing last time I saw him. He states not quite as bad as what he had because this was infected previously but nonetheless is still appears to be doing poorly. Fortunately there is no evidence of systemic infection at this point. The patient tells me that he is not going to be able to afford the Santyl. He is still waiting to hear about the infusion therapy with his dermatologist. Apparently she wants an updated colonoscopy first. Patient History Information obtained from Patient. Family History Diabetes - Mother,Maternal Grandparents, Heart Disease - Mother,Maternal Grandparents, Hereditary Spherocytosis - Siblings, Hypertension - Maternal Grandparents, Stroke - Siblings, No family history of Cancer, Kidney Disease, Lung Disease, Seizures, Thyroid Problems, Tuberculosis. Social History Current some day smoker, Marital Status - Married, Alcohol Use - Rarely, Drug Use - No History, Caffeine Use - Moderate. Medical History Eyes Denies history of Cataracts, Glaucoma, Optic Neuritis Ear/Nose/Mouth/Throat Denies history of Chronic sinus problems/congestion, Middle ear problems Hematologic/Lymphatic Denies history of Anemia, Hemophilia, Human Immunodeficiency Virus, Lymphedema, Sickle Cell Disease Respiratory Patient has history of Sleep Apnea - cpap Denies history of Aspiration, Asthma, Chronic Obstructive Pulmonary Disease (COPD), Pneumothorax Cardiovascular Patient has history of Hypertension Denies history of Angina, Arrhythmia, Congestive Heart Failure, Coronary Artery Disease, Deep Vein Thrombosis, Hypotension, Myocardial Infarction, Peripheral  Arterial Disease, Peripheral Venous Disease, Phlebitis, Vasculitis Gastrointestinal Patient has history of Colitis Endocrine Denies history of Type I Diabetes, Type II Diabetes Genitourinary Denies history of End Stage Renal Disease Immunological Denies history of Lupus Erythematosus, Raynaud s, Scleroderma Jicha, Derward ([id number]) Integumentary (Skin) Denies history of History of Burn, History of pressure wounds Musculoskeletal Denies history of Gout, Rheumatoid Arthritis, Osteoarthritis, Osteomyelitis Neurologic Denies history of Dementia, Neuropathy, Quadriplegia, Paraplegia, Seizure Disorder Oncologic Denies history of Received Chemotherapy, Received Radiation Psychiatric Denies history of Anorexia/bulimia, Confinement Anxiety Review of Systems (ROS) Constitutional Symptoms (General Health) Denies complaints or symptoms of Fever, Chills. Respiratory The patient has no complaints or symptoms. Cardiovascular The patient has no complaints or symptoms. Psychiatric The patient has no complaints or symptoms. Objective Constitutional Well-nourished and well-hydrated in no acute distress. Vitals Time Taken: 8:09 AM, Height: 71 in, Weight: 338 lbs, BMI: 47.1, Temperature: 98.2 F, Pulse: 81 bpm, Respiratory Rate: 18 breaths/min, Blood Pressure: 132/74 mmHg. Respiratory normal breathing without difficulty. clear to auscultation bilaterally. Cardiovascular regular rate and rhythm with normal S1, S2. Psychiatric this patient is able to make decisions and demonstrates good insight into disease process. Alert and Oriented x 3.  pleasant and cooperative. General Notes: Patient's wound bed currently shows evidence of slough covering the surface of the wound and again there does appear to be some granulation at the base of the wound especially in the more anterior portion or ads of the wound bed. Nonetheless still there is significant erythema surrounding the wound bed which is  different compared to last evaluation. This has me at least slightly concerned about the possibility of there being an impending infection trying to brew. Integumentary (Hair, Skin) Wound #1 status is Open. Original cause of wound was Gradually Appeared. The wound is located on the Left,Lateral Lower Leg. The wound measures 3.2cm length x 3.4cm width x 0.1cm depth; 8.545cm^2 area and 0.855cm^3 volume. There is Fat Layer (Subcutaneous Tissue) Exposed exposed. There is no tunneling or undermining noted. There is a medium amount of purulent drainage noted. The wound margin is distinct with the outline attached to the wound base. There is no granulation Catarino, Caio ([id number]) within the wound bed. There is a large (67-100%) amount of necrotic tissue within the wound bed including Adherent Slough. The periwound skin appearance exhibited: Induration, Scarring, Dry/Scaly, Ecchymosis, Hemosiderin Staining. The periwound skin appearance did not exhibit: Callus, Crepitus, Excoriation, Rash, Maceration, Atrophie Blanche, Cyanosis, Mottled, Pallor, Rubor, Erythema. Periwound temperature was noted as No Abnormality. The periwound has tenderness on palpation. 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 Anesthetic (add to Medication List): Wound #1 Left,Lateral Lower Leg: Topical Lidocaine 4% cream applied to wound bed prior to debridement (In Clinic Only). Primary Wound Dressing: Wound #1 Left,Lateral Lower Leg: Other: - Mupirocin mix half and half with clobetasol Secondary Dressing: Wound #1 Left,Lateral Lower Leg: Boardered Foam Dressing Dressing Change Frequency: Wound #1 Left,Lateral Lower Leg: Change dressing every day. Follow-up Appointments: Wound #1  Left,Lateral Lower Leg: Return Appointment in 2 weeks. Edema Control: Wound #1 Left,Lateral Lower Leg: Patient to wear own compression stockings Elevate legs to the level of the heart and pump ankles as often as possible Medications-please add to medication list.: Wound #1 Left,Lateral Lower Leg: P.O. Antibiotics - Prescribed by Dermatology for Staph Infection My suggestion currently is gonna be that we go ahead and initiate the above wound care measures utilizing a steroid cream along with the mupirocin that the patient has at home already to help hopefully treating the infection help with inflammation. He's in agreement with plan. He still on doxycycline as prescribed by dermatology. We'll see were things stand at follow-up. [name], [name] ([id number]) Please see above for specific wound care orders. We will see patient for re-evaluation in 2 week(s) here in the clinic. If anything worsens or changes patient will contact our office for additional recommendations. Electronic Signature(s) Signed: 07/27/2018 5:00:45 PM By: Worthy Keeler PA-C Entered By: Worthy Keeler on 07/27/2018 08:40:52 [name], [name] ([id number]) -------------------------------------------------------------------------------- ROS/PFSH Details Patient Name: [name] Date of Service: 07/27/2018 8:00 AM Medical Record Number: [id number] Patient Account Number: [id number] Date of Birth/Sex: 01/14/1981 (37 y.o. M) Treating RN: Harold Barban Primary Care Provider: [name] Other Clinician: Referring Provider: [name] Treating Provider/Extender: Melburn Hake, Nyeshia Mysliwiec Weeks in Treatment: 8 Information Obtained From Patient Wound History Do you currently have one or more open woundso Yes How many open wounds do you currently haveo 1 Approximately how long have you had your woundso 1 year How  have you been treating your wound(s) until nowo neosporin Has your wound(s) ever healed and then re-openedo No Have  you had any lab work done in the past montho No Have you tested positive for an antibiotic resistant organism (MRSA, VRE)o No Have you tested positive for osteomyelitis (bone infection)o No Have you had any tests for circulation on your legso No Have you had other problems associated with your woundso Infection, Swelling Constitutional Symptoms (General Health) Complaints and Symptoms: Negative for: Fever; Chills Eyes Medical History: Negative for: Cataracts; Glaucoma; Optic Neuritis Ear/Nose/Mouth/Throat Medical History: Negative for: Chronic sinus problems/congestion; Middle ear problems Hematologic/Lymphatic Medical History: Negative for: Anemia; Hemophilia; Human Immunodeficiency Virus; Lymphedema; Sickle Cell Disease Respiratory Complaints and Symptoms: No Complaints or Symptoms Medical History: Positive for: Sleep Apnea - cpap Negative for: Aspiration; Asthma; Chronic Obstructive Pulmonary Disease (COPD); Pneumothorax Cardiovascular Complaints and Symptoms: No Complaints or Symptoms Medical HistoryKINGSLEE, MAIRENA ([id number]) Positive for: Hypertension Negative for: Angina; Arrhythmia; Congestive Heart Failure; Coronary Artery Disease; Deep Vein Thrombosis; Hypotension; Myocardial Infarction; Peripheral Arterial Disease; Peripheral Venous Disease; Phlebitis; Vasculitis Gastrointestinal Medical History: Positive for: Colitis Endocrine Medical History: Negative for: Type I Diabetes; Type II Diabetes Genitourinary Medical History: Negative for: End Stage Renal Disease Immunological Medical History: Negative for: Lupus Erythematosus; Raynaudos; Scleroderma Integumentary (Skin) Medical History: Negative for: History of Burn; History of pressure wounds Musculoskeletal Medical History: Negative for: Gout; Rheumatoid Arthritis; Osteoarthritis; Osteomyelitis Neurologic Medical History: Negative for: Dementia; Neuropathy; Quadriplegia; Paraplegia; Seizure  Disorder Oncologic Medical History: Negative for: Received Chemotherapy; Received Radiation Psychiatric Complaints and Symptoms: No Complaints or Symptoms Medical History: Negative for: Anorexia/bulimia; Confinement Anxiety Immunizations Pneumococcal Vaccine: Received Pneumococcal Vaccination: No Implantable Devices No devices added JAMARQUIS, CRULL ([id number]) Family and Social History Cancer: No; Diabetes: Yes - Mother,Maternal Grandparents; Heart Disease: Yes - Mother,Maternal Grandparents; Hereditary Spherocytosis: Yes - Siblings; Hypertension: Yes - Maternal Grandparents; Kidney Disease: No; Lung Disease: No; Seizures: No; Stroke: Yes - Siblings; Thyroid Problems: No; Tuberculosis: No; Current some day smoker; Marital Status - Married; Alcohol Use: Rarely; Drug Use: No History; Caffeine Use: Moderate; Financial Concerns: No; Food, Clothing or Shelter Needs: No; Support System Lacking: No; Transportation Concerns: No; Advanced Directives: No; Patient does not want information on Advanced Directives; Living Will: No Physician Affirmation I have reviewed and agree with the above information. Electronic Signature(s) Signed: 07/27/2018 10:54:38 AM By: Harold Barban Signed: 07/27/2018 5:00:45 PM By: Worthy Keeler PA-C Entered By: Worthy Keeler on 07/27/2018 08:39:49 TAJI, BARRETTO ([id number]) -------------------------------------------------------------------------------- SuperBill Details Patient Name: [name] Date of Service: 07/27/2018 Medical Record Number: [id number] Patient Account Number: [id number] Date of Birth/Sex: 11-Nov-1980 (37 y.o. M) Treating RN: Harold Barban Primary Care Provider: [name] Other Clinician: Referring Provider: [name] Treating Provider/Extender: Melburn Hake, Katerra Ingman Weeks in Treatment: 26 Diagnosis Coding ICD-10 Codes Code Description 9521607997 Non-pressure chronic ulcer of left calf with fat layer exposed L88 Pyoderma  gangrenosum I87.2 Venous insufficiency (chronic) (peripheral) Facility Procedures CPT4 Code: [id number] Description: 97847 - WOUND CARE VISIT-LEV 3 EST PT Modifier: Quantity: 1 Physician Procedures CPT4 Code: 8412820 Description: 81388 - WC PHYS LEVEL 4 - EST PT ICD-10 Diagnosis Description L97.222 Non-pressure chronic ulcer of left calf with fat layer expo L88 Pyoderma gangrenosum I87.2 Venous insufficiency (chronic) (peripheral) Modifier: sed Quantity: 1 Electronic Signature(s) Signed: 07/27/2018 5:00:45 PM By: Worthy Keeler PA-C Entered By: Worthy Keeler on 07/27/2018 08:41:03

## 2018-07-28 NOTE — Progress Notes (Signed)
[name] ([id number]) Visit Report for 07/27/2018 Arrival Information Details Patient Name: [name] Date of Service: 07/27/2018 8:00 AM Medical Record Number: [id number] Patient Account Number: [id number] Date of Birth/Sex: 07/26/1980 (38 y.o. M) Treating RN: Harold Barban Primary Care Adon Gehlhausen: [name] Other Clinician: Referring Emersen Carroll: [name] Treating Yassen Kinnett/Extender: Melburn Hake, [name]: 77 Visit Information History Since Last Visit Added or deleted any medications: No Patient Arrived: Ambulatory Any new allergies or adverse reactions: No Arrival Time: 08:07 Had a fall or experienced change in No Accompanied By: self activities of daily living that may affect Transfer Assistance: None risk of falls: Patient Identification Verified: Yes Signs or symptoms of abuse/neglect since last visito No Secondary Verification Process Completed: Yes Hospitalized since last visit: No Patient Requires Transmission-Based No Implantable device outside of the clinic excluding No Precautions: cellular tissue based products placed in the center Patient Has Alerts: No since last visit: Has Dressing in Place as Prescribed: Yes Pain Present Now: No Electronic Signature(s) Signed: 07/27/2018 3:28:34 PM By: Lorine Bears RCP, RRT, CHT Entered By: Lorine Bears on 07/27/2018 08:08:48 [name] ([id number]) -------------------------------------------------------------------------------- Clinic Level of Care Assessment Details Patient Name: [name] Date of Service: 07/27/2018 8:00 AM Medical Record Number: [id number] Patient Account Number: [id number] Date of Birth/Sex: 09-13-1980 (38 y.o. M) Treating RN: Harold Barban Primary Care Jahnasia Tatum: [name] Other Clinician: Referring Brentyn Seehafer: [name] Treating Dakia Schifano/Extender: Melburn Hake, [name]: 66 Clinic Level of Care Assessment  Items TOOL 4 Quantity Score []  - Use when only an EandM is performed on FOLLOW-UP visit 0 ASSESSMENTS - Nursing Assessment / Reassessment X - Reassessment of Co-morbidities (includes updates in patient status) 1 10 X- 1 5 Reassessment of Adherence to Treatment Plan ASSESSMENTS - Wound and Skin Assessment / Reassessment X - Simple Wound Assessment / Reassessment - one wound 1 5 []  - 0 Complex Wound Assessment / Reassessment - multiple wounds []  - 0 Dermatologic / Skin Assessment (not related to wound area) ASSESSMENTS - Focused Assessment []  - Circumferential Edema Measurements - multi extremities 0 []  - 0 Nutritional Assessment / Counseling / Intervention []  - 0 Lower Extremity Assessment (monofilament, tuning fork, pulses) []  - 0 Peripheral Arterial Disease Assessment (using hand held doppler) ASSESSMENTS - Ostomy and/or Continence Assessment and Care []  - Incontinence Assessment and Management 0 []  - 0 Ostomy Care Assessment and Management (repouching, etc.) PROCESS - Coordination of Care X - Simple Patient / Family Education for ongoing care 1 15 []  - 0 Complex (extensive) Patient / Family Education for ongoing care []  - 0 Staff obtains Programmer, systems, Records, Test Results / Process Orders []  - 0 Staff telephones HHA, Nursing Homes / Clarify orders / etc []  - 0 Routine Transfer to another Facility (non-emergent condition) []  - 0 Routine Hospital Admission (non-emergent condition) []  - 0 New Admissions / Biomedical engineer / Ordering NPWT, Apligraf, etc. []  - 0 Emergency Hospital Admission (emergent condition) X- 1 10 Simple Discharge Coordination [name] ([id number]) []  - 0 Complex (extensive) Discharge Coordination PROCESS - Special Needs []  - Pediatric / Minor Patient Management 0 []  - 0 Isolation Patient Management []  - 0 Hearing / Language / Visual special needs []  - 0 Assessment of Community assistance (transportation, D/C planning, etc.) []  -  0 Additional assistance / Altered mentation []  - 0 Support Surface(s) Assessment (bed, cushion, seat, etc.) INTERVENTIONS - Wound Cleansing / Measurement X - Simple Wound Cleansing - one wound 1 5 []  - 0 Complex Wound  Cleansing - multiple wounds X- 1 5 Wound Imaging (photographs - any number of wounds) []  - 0 Wound Tracing (instead of photographs) X- 1 5 Simple Wound Measurement - one wound []  - 0 Complex Wound Measurement - multiple wounds INTERVENTIONS - Wound Dressings []  - Small Wound Dressing one or multiple wounds 0 X- 1 15 Medium Wound Dressing one or multiple wounds []  - 0 Large Wound Dressing one or multiple wounds X- 1 5 Application of Medications - topical []  - 0 Application of Medications - injection INTERVENTIONS - Miscellaneous []  - External ear exam 0 []  - 0 Specimen Collection (cultures, biopsies, blood, body fluids, etc.) []  - 0 Specimen(s) / Culture(s) sent or taken to Lab for analysis []  - 0 Patient Transfer (multiple staff / Civil Service fast streamer / Similar devices) []  - 0 Simple Staple / Suture removal (25 or less) []  - 0 Complex Staple / Suture removal (26 or more) []  - 0 Hypo / Hyperglycemic Management (close monitor of Blood Glucose) []  - 0 Ankle / Brachial Index (ABI) - do not check if billed separately X- 1 5 Vital Signs [name] ([id number]) Has the patient been seen at the hospital within the last three years: Yes Total Score: 85 Level Of Care: New/Established - Level 3 Electronic Signature(s) Signed: 07/27/2018 10:54:38 AM By: Harold Barban Entered By: Harold Barban on 07/27/2018 08:32:50 [name] ([id number]) -------------------------------------------------------------------------------- Encounter Discharge Information Details Patient Name: [name] Date of Service: 07/27/2018 8:00 AM Medical Record Number: [id number] Patient Account Number: [id number] Date of Birth/Sex: Feb 28, 1981 (38 y.o. M) Treating RN: Army Melia Primary  Care Saree Krogh: [name] Other Clinician: Referring Kiyan Burmester: [name] Treating Amzie Sillas/Extender: Melburn Hake, [name]: 30 Encounter Discharge Information Items Discharge Condition: Stable Ambulatory Status: Ambulatory Discharge Destination: Home Transportation: Ambulance Accompanied By: self Schedule Follow-up Appointment: Yes Clinical Summary of Care: Electronic Signature(s) Signed: 07/27/2018 11:22:39 AM By: Army Melia Entered By: Army Melia on 07/27/2018 08:44:38 [name] ([id number]) -------------------------------------------------------------------------------- Lower Extremity Assessment Details Patient Name: [name] Date of Service: 07/27/2018 8:00 AM Medical Record Number: [id number] Patient Account Number: [id number] Date of Birth/Sex: Dec 26, 1980 (38 y.o. M) Treating RN: Montey Hora Primary Care Zaharah Amir: [name] Other Clinician: Referring Eleftheria Taborn: [name] Treating Dimitri Shakespeare/Extender: Melburn Hake, [name]: 54 Vascular Assessment Pulses: Dorsalis Pedis Palpable: [Left:Yes] Posterior Tibial Extremity colors, hair growth, and conditions: Extremity Color: [Left:Hyperpigmented] Hair Growth on Extremity: [Left:Yes] Temperature of Extremity: [Left:Warm] Capillary Refill: [Left:< 3 seconds] Toe Nail Assessment Left: Right: Thick: Yes Discolored: Yes Deformed: No Improper Length and Hygiene: No Electronic Signature(s) Signed: 07/27/2018 10:44:46 AM By: Montey Hora Entered By: Montey Hora on 07/27/2018 08:18:05 [name] ([id number]) -------------------------------------------------------------------------------- Multi Wound Chart Details Patient Name: [name] Date of Service: 07/27/2018 8:00 AM Medical Record Number: [id number] Patient Account Number: [id number] Date of Birth/Sex: [birth year] (38 y.o. M) Treating RN: Harold Barban Primary Care Katiya Fike: [name] Other  Clinician: Referring Reyn Faivre: [name] Treating Jorey Dollard/Extender: Melburn Hake, [name]: 31 Vital Signs Height(in): 71 Pulse(bpm): 81 Weight(lbs): 338 Blood Pressure(mmHg): 132/74 Body Mass Index(BMI): 47 Temperature(F): 98.2 Respiratory Rate 18 (breaths/min): Photos: [N/A:N/A] Wound Location: Left Lower Leg - Lateral N/A N/A Wounding Event: Gradually Appeared N/A N/A Primary Etiology: Pyoderma N/A N/A Comorbid History: Sleep Apnea, Hypertension, N/A N/A Colitis Date Acquired: 11/18/2015 N/A N/A Weeks of Treatment: 85 N/A N/A Wound Status: Open N/A N/A Measurements L x W x D 3.2x3.4x0.1 N/A N/A (cm) Area (cm) : 8.545 N/A N/A Volume (cm) : 0.855  N/A N/A % Reduction in Area: -74.10% N/A N/A % Reduction in Volume: 78.20% N/A N/A Classification: Full Thickness With Exposed N/A N/A Support Structures Exudate Amount: Medium N/A N/A Exudate Type: Purulent N/A N/A Exudate Color: yellow, brown, green N/A N/A Wound Margin: Distinct, outline attached N/A N/A Granulation Amount: None Present (0%) N/A N/A Necrotic Amount: Large (67-100%) N/A N/A Exposed Structures: Fat Layer (Subcutaneous N/A N/A Tissue) Exposed: Yes Fascia: No Tendon: No Muscle: No Joint: No Bone: No Goldsmith, Jahmari ([id number]) Epithelialization: Small (1-33%) N/A N/A Periwound Skin Texture: Induration: Yes N/A N/A Scarring: Yes Excoriation: No Callus: No Crepitus: No Rash: No Periwound Skin Moisture: Dry/Scaly: Yes N/A N/A Maceration: No Periwound Skin Color: Ecchymosis: Yes N/A N/A Hemosiderin Staining: Yes Atrophie Blanche: No Cyanosis: No Erythema: No Mottled: No Pallor: No Rubor: No Temperature: No Abnormality N/A N/A Tenderness on Palpation: Yes N/A N/A Wound Preparation: Ulcer Cleansing: N/A N/A Rinsed/Irrigated with Saline Topical Anesthetic Applied: Other: lidocaine 4% Treatment Notes Electronic Signature(s) Signed: 07/27/2018 10:54:38 AM By: Harold Barban Entered By: Harold Barban on 07/27/2018 08:28:35 ELIO, HADEN ([id number]) -------------------------------------------------------------------------------- Multi-Disciplinary Care Plan Details Patient Name: [name] Date of Service: 07/27/2018 8:00 AM Medical Record Number: [id number] Patient Account Number: [id number] Date of Birth/Sex: 1981/05/18 (38 y.o. M) Treating RN: Harold Barban Primary Care Adlee Paar: [name] Other Clinician: Referring Molley Houser: [name] Treating Greysen Swanton/Extender: Melburn Hake, [name]: 4 Active Inactive Orientation to the Wound Care Program Nursing Diagnoses: Knowledge deficit related to the wound healing center program Goals: Patient/caregiver will verbalize understanding of the [facility] Program Date Initiated: 12/11/2016 Target Resolution Date: 03/13/2017 Goal Status: Active Interventions: Provide education on orientation to the wound center Notes: Venous Leg Ulcer Nursing Diagnoses: Knowledge deficit related to disease process and management Potential for venous Insuffiency (use before diagnosis confirmed) Goals: Non-invasive venous studies are completed as ordered Date Initiated: 12/11/2016 Target Resolution Date: 03/13/2017 Goal Status: Active Patient will maintain optimal edema control Date Initiated: 12/11/2016 Target Resolution Date: 03/13/2017 Goal Status: Active Patient/caregiver will verbalize understanding of disease process and disease management Date Initiated: 12/11/2016 Target Resolution Date: 03/13/2017 Goal Status: Active Verify adequate tissue perfusion prior to therapeutic compression application Date Initiated: 12/11/2016 Target Resolution Date: 03/13/2017 Goal Status: Active Interventions: Assess peripheral edema status every visit. Compression as ordered Provide education on venous insufficiency Treatment Activities: JESHAWN, MELUCCI ([id number]) Therapeutic  compression applied : 12/11/2016 Notes: Wound/Skin Impairment Nursing Diagnoses: Impaired tissue integrity Knowledge deficit related to ulceration/compromised skin integrity Goals: Patient/caregiver will verbalize understanding of skin care regimen Date Initiated: 12/11/2016 Target Resolution Date: 03/13/2017 Goal Status: Active Ulcer/skin breakdown will have a volume reduction of 30% by week 4 Date Initiated: 12/11/2016 Target Resolution Date: 03/13/2017 Goal Status: Active Ulcer/skin breakdown will have a volume reduction of 50% by week 8 Date Initiated: 12/11/2016 Target Resolution Date: 03/13/2017 Goal Status: Active Ulcer/skin breakdown will have a volume reduction of 80% by week 12 Date Initiated: 12/11/2016 Target Resolution Date: 03/13/2017 Goal Status: Active Ulcer/skin breakdown will heal within 14 weeks Date Initiated: 12/11/2016 Target Resolution Date: 03/13/2017 Goal Status: Active Interventions: Assess patient/caregiver ability to obtain necessary supplies Assess patient/caregiver ability to perform ulcer/skin care regimen upon admission and as needed Assess ulceration(s) every visit Provide education on ulcer and skin care Treatment Activities: Skin care regimen initiated : 12/11/2016 Topical wound management initiated : 12/11/2016 Notes: Electronic Signature(s) Signed: 07/27/2018 10:54:38 AM By: Harold Barban Entered By: Harold Barban on 07/27/2018 27:03:50 [name] ([id number]) -------------------------------------------------------------------------------- Pain Assessment Details Patient Name:  [name] Date of Service: 07/27/2018 8:00 AM Medical Record Number: [id number] Patient Account Number: [id number] Date of Birth/Sex: 1980/10/28 (38 y.o. M) Treating RN: Harold Barban Primary Care Becci Batty: [name] Other Clinician: Referring Teagen Bucio: [name] Treating Argentina Kosch/Extender: Melburn Hake, [name]: 46 Active  Problems Location of Pain Severity and Description of Pain Patient Has Paino No Site Locations Pain Management and Medication Current Pain Management: Electronic Signature(s) Signed: 07/27/2018 10:54:38 AM By: Harold Barban Signed: 07/27/2018 3:28:34 PM By: Lorine Bears RCP, RRT, CHT Entered By: Lorine Bears on 07/27/2018 08:08:58 [name] ([id number]) -------------------------------------------------------------------------------- Patient/Caregiver Education Details Patient Name: [name] Date of Service: 07/27/2018 8:00 AM Medical Record Number: [id number] Patient Account Number: [id number] Date of Birth/Gender: 1981/02/01 (38 y.o. M) Treating RN: Harold Barban Primary Care Physician: [name] Other Clinician: Referring Physician: [name] Treating Physician/Extender: Sharalyn Ink in Treatment: 40 Education Assessment Education Provided To: Patient Education Topics Provided Wound/Skin Impairment: Handouts: Caring for Your Ulcer Methods: Demonstration, Explain/Verbal Responses: State content correctly Electronic Signature(s) Signed: 07/27/2018 10:54:38 AM By: Harold Barban Entered By: Harold Barban on 07/27/2018 08:28:51 [name] ([id number]) -------------------------------------------------------------------------------- Wound Assessment Details Patient Name: [name] Date of Service: 07/27/2018 8:00 AM Medical Record Number: [id number] Patient Account Number: [id number] Date of Birth/Sex: 1980/12/26 (38 y.o. M) Treating RN: Montey Hora Primary Care Braian Tijerina: [name] Other Clinician: Referring Fianna Snowball: [name] Treating Kenidi Elenbaas/Extender: Melburn Hake, [name]: 74 Wound Status Wound Number: 1 Primary Etiology: Pyoderma Wound Location: Left Lower Leg - Lateral Wound Status: Open Wounding Event: Gradually Appeared Comorbid History: Sleep Apnea, Hypertension,  Colitis Date Acquired: 11/18/2015 Weeks Of Treatment: 85 Clustered Wound: No Photos Wound Measurements Length: (cm) 3.2 Width: (cm) 3.4 Depth: (cm) 0.1 Area: (cm) 8.545 Volume: (cm) 0.855 % Reduction in Area: -74.1% % Reduction in Volume: 78.2% Epithelialization: Small (1-33%) Tunneling: No Undermining: No Wound Description Full Thickness With Exposed Support Foul Odor A Classification: Structures Slough/Fibr Wound Margin: Distinct, outline attached Exudate Medium Amount: Exudate Type: Purulent Exudate Color: yellow, brown, green fter Cleansing: No ino Yes Wound Bed Granulation Amount: None Present (0%) Exposed Structure Necrotic Amount: Large (67-100%) Fascia Exposed: No Necrotic Quality: Adherent Slough Fat Layer (Subcutaneous Tissue) Exposed: Yes Tendon Exposed: No Muscle Exposed: No Joint Exposed: No Bone Exposed: No Periwound Skin Texture Kina, Adreyan ([id number]) Texture Color No Abnormalities Noted: No No Abnormalities Noted: No Callus: No Atrophie Blanche: No Crepitus: No Cyanosis: No Excoriation: No Ecchymosis: Yes Induration: Yes Erythema: No Rash: No Hemosiderin Staining: Yes Scarring: Yes Mottled: No Pallor: No Moisture Rubor: No No Abnormalities Noted: No Dry / Scaly: Yes Temperature / Pain Maceration: No Temperature: No Abnormality Tenderness on Palpation: Yes Wound Preparation Ulcer Cleansing: Rinsed/Irrigated with Saline Topical Anesthetic Applied: Other: lidocaine 4%, Treatment Notes Wound #1 (Left, Lateral Lower Leg) Notes mupircin, ABD Electronic Signature(s) Signed: 07/27/2018 10:44:46 AM By: Montey Hora Entered By: Montey Hora on 07/27/2018 08:20:47 [name] ([id number]) -------------------------------------------------------------------------------- Vitals Details Patient Name: [name] Date of Service: 07/27/2018 8:00 AM Medical Record Number: [id number] Patient Account Number: [id number] Date of  Birth/Sex: [birth year] (38 y.o. M) Treating RN: Harold Barban Primary Care Leeum Sankey: [name] Other Clinician: Referring Duanna Runk: [name] Treating Demitria Hay/Extender: Melburn Hake, [name]: 14 Vital Signs Time Taken: 08:09 Temperature (F): 98.2 Height (in): 71 Pulse (bpm): 81 Weight (lbs): 338 Respiratory Rate (breaths/min): 18 Body Mass Index (BMI): 47.1 Blood Pressure (mmHg): 132/74 Reference Range: 80 - 120 mg / dl Electronic Signature(s) Signed:  07/27/2018 3:28:34 PM By: Lorine Bears RCP, RRT, CHT Entered By: Lorine Bears on 07/27/2018 08:11:43

## 2018-08-07 ENCOUNTER — Telehealth: Payer: 59 | Admitting: Family

## 2018-08-07 DIAGNOSIS — J329 Chronic sinusitis, unspecified: Secondary | ICD-10-CM

## 2018-08-07 DIAGNOSIS — B9789 Other viral agents as the cause of diseases classified elsewhere: Secondary | ICD-10-CM

## 2018-08-07 MED ORDER — FLUTICASONE PROPIONATE 50 MCG/ACT NA SUSP: 1.0000 | Freq: Two times a day (BID) | NASAL | 6 refills | Status: DC

## 2018-08-07 NOTE — Progress Notes (Signed)
We are sorry that you are not feeling well.  Here is how we plan to help!  Based on what you have shared with me it looks like you have sinusitis.  Sinusitis is inflammation and infection in the sinus cavities of the head.  Based on your presentation I believe you most likely have Acute Viral Sinusitis.This is an infection most likely caused by a virus. There is not specific treatment for viral sinusitis other than to help you with the symptoms until the infection runs its course.  You may use an oral decongestant such as Mucinex D or if you have glaucoma or high blood pressure use plain Mucinex. Saline nasal spray help and can safely be used as often as needed for congestion, I have prescribed: Fluticasone nasal spray two sprays in each nostril once a day  Some authorities believe that zinc sprays or the use of Echinacea may shorten the course of your symptoms.  Sinus infections are not as easily transmitted as other respiratory infection, however we still recommend that you avoid close contact with loved ones, especially the very young and elderly.  Remember to wash your hands thoroughly throughout the day as this is the number one way to prevent the spread of infection!  Home Care:  Only take medications as instructed by your medical team.  Do not take these medications with alcohol.  A steam or ultrasonic humidifier can help congestion.  You can place a towel over your head and breathe in the steam from hot water coming from a faucet.  Avoid close contacts especially the very young and the elderly.  Cover your mouth when you cough or sneeze.  Always remember to wash your hands.  Get Help Right Away If:  You develop worsening fever or sinus pain.  You develop a severe head ache or visual changes.  Your symptoms persist after you have completed your treatment plan.  Make sure you  Understand these instructions.  Will watch your condition.  Will get help right away if you are not  doing well or get worse.  Your e-visit answers were reviewed by a board certified advanced clinical practitioner to complete your personal care plan.  Depending on the condition, your plan could have included both over the counter or prescription medications.  If there is a problem please reply  once you have received a response from your provider.  Your safety is important to us.  If you have drug allergies check your prescription carefully.    You can use MyChart to ask questions about today's visit, request a non-urgent call back, or ask for a work or school excuse for 24 hours related to this e-Visit. If it has been greater than 24 hours you will need to follow up with your provider, or enter a new e-Visit to address those concerns.  You will get an e-mail in the next two days asking about your experience.  I hope that your e-visit has been valuable and will speed your recovery. Thank you for using e-visits.   

## 2018-08-10 ENCOUNTER — Other Ambulatory Visit: Payer: Self-pay

## 2018-08-10 ENCOUNTER — Encounter: Payer: 59 | Admitting: Physician Assistant

## 2018-08-10 DIAGNOSIS — B958 Unspecified staphylococcus as the cause of diseases classified elsewhere: Secondary | ICD-10-CM | POA: Diagnosis not present

## 2018-08-10 DIAGNOSIS — L88 Pyoderma gangrenosum: Secondary | ICD-10-CM | POA: Diagnosis not present

## 2018-08-10 DIAGNOSIS — E11622 Type 2 diabetes mellitus with other skin ulcer: Secondary | ICD-10-CM | POA: Diagnosis not present

## 2018-08-10 DIAGNOSIS — L97822 Non-pressure chronic ulcer of other part of left lower leg with fat layer exposed: Secondary | ICD-10-CM | POA: Diagnosis not present

## 2018-08-13 NOTE — Progress Notes (Signed)
[name], [name] ([id number]) Visit Report for 08/10/2018 Chief Complaint Document Details Patient Name: [name], [name] Date of Service: 08/10/2018 8:15 AM Medical Record Number: [id number] Patient Account Number: [id number] Date of Birth/Sex: 13-Aug-1980 (38 y.o. M) Treating RN: Harold Barban Primary Care Provider: [name] Other Clinician: Referring Provider: [name] Treating Provider/Extender: Melburn Hake, Brason Berthelot Weeks in Treatment: 47 Information Obtained from: Patient Chief Complaint He is here in follow up evaluation for LLE pyoderma ulcer Electronic Signature(s) Signed: 08/12/2018 11:01:46 PM By: Worthy Keeler PA-C Entered By: Worthy Keeler on 08/10/2018 08:32:16 HJALMAR, BALLENGEE ([id number]) -------------------------------------------------------------------------------- HPI Details Patient Name: [name] Date of Service: 08/10/2018 8:15 AM Medical Record Number: [id number] Patient Account Number: [id number] Date of Birth/Sex: 10-23-1980 (37 y.o. M) Treating RN: Harold Barban Primary Care Provider: [name] Other Clinician: Referring Provider: [name] Treating Provider/Extender: Melburn Hake, Kayani Rapaport Weeks in Treatment: 64 History of Present Illness HPI Description: 12/04/16; 38 year old man who comes into the clinic today for review of a wound on the posterior left calf. He tells me that is been there for about a year. He is not a diabetic he does smoke half a pack per day. He was seen in the ER on 11/20/16 felt to have cellulitis around the wound and was given clindamycin. An x-ray did not show osteomyelitis. The patient initially tells me that he has a milk allergy that sets off a pruritic itching rash on his lower legs which she scratches incessantly and he thinks that's what may have set up the wound. He has been using various topical antibiotics and ointments without any effect. He works in a [employer] and is on his feet all day. He does not have a  prior history of wounds however he does have the rash on both lower legs the right arm and the ventral aspect of his left arm. These are excoriations and clearly have had scratching however there are of macular looking areas on both legs including a substantial larger area on the right leg. This does not have an underlying open area. There is no blistering. The patient tells me that 2 years ago in [place] in response to the rash on his legs he saw a dermatologist who told him he had a condition which may be pyoderma gangrenosum although I may be putting words into his mouth. He seemed to recognize this. 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 He is not in any treatment for this. He's had no recent travel 12/11/16; the patient arrives today with his wound and roughly the same condition we've been using silver alginate this is a deep punched out wound with some surrounding erythema but no tenderness. Biopsy I did did not show confirmed pyoderma gangrenosum suggested nonspecific inflammation and vasculitis but does not provide an actual description of what was seen by the pathologist. I'm really not able to understand this We have also received information from the patient's dermatologist in [place] notes from April 2016. This was a doctor Agarwal- antal. 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 He was prescribed topical steroid high potency under occlusion which helped but at this point the patient did not have a deep punched out wound. 12/18/16; the patient's wound is larger in terms of surface area however this surface looks better and there is less depth. The surrounding erythema also is better. The patient states that the wrap we put on came off 2 days ago when he has been using  his compression stockings. He we are in the process of getting a dermatology consult. 12/26/16 on evaluation today patient's left lower extremity wound shows evidence  of infection with surrounding erythema noted. He has been tolerating the dressing changes but states that he has noted more discomfort. There is a larger area of erythema surrounding the wound. No fevers, chills, nausea, or vomiting noted at this time. With that being said the wound still does have slough covering the surface. He is not allergic to any medication that he is aware of at this point. In regard to his right lower extremity he had several regions that are erythematous and pruritic he wonders if there's anything we can do to help that. 01/02/17 I reviewed patient's wound culture which was obtained his visit last week. He was placed on doxycycline at that point. Unfortunately that does not appear to be an antibiotic that would likely help with the situation however the pseudomonas noted on culture is sensitive to Cipro. Also unfortunately patient's wound seems to have a large compared to last week's evaluation. Not severely so but there are definitely increased measurements in general. He is continuing to have discomfort as well he writes this to be a seven out of 10. In fact he would prefer me not to perform any debridement today due to the fact that he is having discomfort and considering he has an active infection on the little reluctant to do so anyway. No fevers, chills, nausea, or vomiting noted at this time. 01/08/17; patient seems dermatology on September 5. I suspect dermatology will want the slides from the biopsy I did sent to their pathologist. I'm not sure if there is a way we can expedite that. In any case the culture I did before I left on vacation 3 weeks ago showed Pseudomonas he was given 10 days of Cipro and per her description of her intake nurses is actually somewhat better this week although the wound is quite a bit bigger than I remember the last time I saw this. He still has 3 more days of Cipro KODIE, KISHI ([id number]) 01/21/17; dermatology appointment tomorrow. He  has completed the ciprofloxacin for Pseudomonas. Surface of the wound looks better however he is had some deterioration in the lesions on his right leg. Meantime the left lateral leg wound we will continue with sample 01/29/17; patient had his dermatology appointment but I can't yet see that note. He is completed his antibiotics. The wound is more superficial but considerably larger in circumferential area than when he came in. This is in his left lateral calf. He also has swollen erythematous areas with superficial wounds on the right leg and small papular areas on both arms. There apparently areas in her his upper thighs and buttocks I did not look at those. Dermatology biopsied the right leg. Hopefully will have their input next week. 02/05/17; patient went back to see his dermatologist who told him that he had a "scratching problem" as well as staph. He is now on a 30 day course of doxycycline and I believe she gave him triamcinolone cream to the right leg areas to help with the itching [not exactly sure but probably triamcinolone]. She apparently looked at the left lateral leg wound although this was not rebiopsied and I think felt to be ultimately part of the same pathogenesis. He is using sample border foam and changing nevus himself. He now has a new open area on the right posterior leg which was his biopsy site I don't  have any of the dermatology notes 02/12/17; we put the patient in compression last week with SANTYL to the wound on the left leg and the biopsy. Edema is much better and the depth of the wound is now at level of skin. Area is still the same oBiopsy site on the right lateral leg we've also been using santyl with a border foam dressing and he is changing this himself. 02/19/17; Using silver alginate started last week to both the substantial left leg wound and the biopsy site on the right wound. He is tolerating compression well. Has a an appointment with his primary M.D. tomorrow  wondering about diuretics although I'm wondering if the edema problem is actually lymphedema 02/26/17; the patient has been to see his primary doctor Dr. Jerrel Ivory at [facility] primary [facility]. She started him on Lasix 20 mg and this seems to have helped with the edema. However we are not making substantial change with the left lateral calf wound and inflammation. The biopsy site on the right leg also looks stable but not really all that different. 03/12/17; the patient has been to see vein and vascular Dr. Lucky Cowboy. He has had venous reflux studies I have not reviewed these. I did get a call from his dermatology office. 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 They therefore recommended clobetasol and tetracycline i.e. 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 03/26/17; apparently the patient just had reflux studies not an appointment with Dr. dew. She arrives in clinic today having applied clobetasol for 2-3 weeks. He notes over the last 2-3 days excessive drainage having to change the dressing 3-4 times a day and also expanding erythema. 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 Lillia Carmel at [facility] dermatology locally who kindly saw him at my request. 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 My concern was the degree of erythema medially extending from his left leg wound which was either inflammation from  pyoderma or cellulitis. I put him on Augmentin however culture of the wound showed Pseudomonas which is quinolone sensitive. I really don't believe he has cellulitis however in view of everything I will continue and give him a course of Cipro. He is also on doxycycline as an immune modulator for the pyoderma. In addition to his original wound on the left lateral leg with surrounding erythema he has a wound on the right posterior calf which was an original biopsy site done by dermatology. 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 Saw Dr. Lillia Carmel yesterday. He has been using topical antibiotics to both wound areas his original wound on the left and the biopsies/pathergy area on the right. There is definitely some improvement in the inflammation around the wound on the right although the patient states he has increasing sensitivity of the wounds. He is on prednisone 60 and doxycycline 1 as prescribed by Dr. Lillia Carmel. He is covering the topical  antibiotic with gauze and putting this in his own compression stocks and changing this daily. He states that Dr. Lottie Rater did a culture of the left leg wound yesterday 05/07/17; pyoderma gangrenosum. The patient saw Dr. Lillia Carmel yesterday and has a follow-up with her in one month. He is still using topical antibiotics to both wounds although he can't recall exactly what type. He is still on prednisone 60 mg. Dr. Lillia Carmel stated that the doxycycline could stop if we were in agreement. 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 The right medial leg was induced by biopsy/pathergy. The area on the right is essentially healed. 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 The right medial leg has closed and  remains closed. He is still on prednisone 60. oHe tells me he missed his last dermatology appointment with Dr. Lillia Carmel but will make another appointment. He reports that her blood sugar at a recent screen in [place] was high 200's. He was 180 today. He is more cushingoid blood pressure is Waddill, Elieser ([id number]) up a bit. I think he is going to require still much longer prednisone perhaps another 3 months before attempting to taper. In the meantime his wound is a lot better. Smaller. He is cleaning this off daily and applying topical antibiotics. When he was last in the clinic I thought about changing to [facility] and actually put in a couple of calls to dermatology although probably not during their business hours. 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 He continues on oral prednisone. He has been using triple antibiotic ointment. There is surface debris and we will transition to [facility] and have him return in 2 weeks. He has lost 30 pounds since his last appointment with lifestyle modification. He may benefit from topical steroid cream for treatment this can be considered at a later date. 08/22/17 on evaluation today patient appears to actually be doing rather well in regard to his left lateral lower extremity ulcer. He has actually been managed by Dr. Dellia Nims most recently. Patient is currently on oral steroids at this time. This seems to have been of benefit for him. Nonetheless his last visit was actually with Leah on 08/06/17. Currently he is not utilizing any topical steroid creams although this could be of benefit as well. No fevers, chills, nausea, or vomiting noted at this time. 09/05/17 on evaluation today patient appears to be doing better in regard to his left lateral lower extremity ulcer. He has been tolerating the dressing changes without complication.  He is using Santyl with good effect. Overall I'm very pleased with how things are standing at this point. Patient likewise is happy that this is doing better. 09/19/17 on evaluation today patient actually appears to be doing rather well in regard to his left lateral lower extremity ulcer. 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 He's not having any significant pain. 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 He has been tolerating the Santyl without complication and in general I feel like he's making good  progress. 10/17/17 on evaluation today patient appears to be doing very well in regard to his left lateral lower surety ulcer. He has been tolerating the dressing changes without complication. There does not appear to be any evidence of infection he's alternating the Santyl and the triple antibiotic ointment every other day this seems to be doing well for him. 11/03/17 on evaluation today patient appears to be doing very well in regard to his left lateral lower extremity ulcer. He is been tolerating the dressing changes without complication which is good news. Fortunately there does not appear to be any evidence of infection which is also great news. Overall is doing excellent they are starting to taper down on the prednisone is down to 40 mg at this point it also started topical clobetasol for him. 11/17/17 on evaluation today patient appears to be doing well in regard to his left lateral lower surety ulcer. He's been tolerating the dressing changes without complication. He does note that he is having no pain, no excessive drainage or discharge, and overall he feels like things are going about how he would expect and hope they would. Overall he seems to have no evidence of infection at this time in my opinion which is  good news. 12/04/17-He is seen in follow-up evaluation for right lateral lower extremity ulcer. He has been applying topical steroid cream. Today's measurement show slight increase in size. Over the next 2 weeks we will transition to every other day Santyl and steroid cream. He has been encouraged to monitor for changes and notify clinic with any concerns 12/15/17 on evaluation today patient's left lateral motion the ulcer and fortunately is doing worse again at this point. This just since last week to this week has close to doubled in size according to the patient. I did not seeing last week's I do not have a visual to compare this to in our system was also down so we do not have all the charts and at this point. Nonetheless it does have me somewhat concerned in regard to the fact that again he was worried enough about it he has contact the dermatology that placed them back on the full strength, 50 mg a day of the prednisone that he was taken previous. He continues to alternate using clobetasol along with Santyl at this point. He is obviously somewhat frustrated. 12/22/17 on evaluation today patient appears to be doing a little worse compared to last evaluation. Unfortunately the wound is a little deeper and slightly larger than the last week's evaluation. With that being said he has made some progress in regard to the irritation surrounding at this time unfortunately despite that progress that's been made he still has a significant issue going on here. 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 For that reason I discussed with him today the possibility of perform a wound culture to ensure there's nothing overtly infected. 01/06/18 on evaluation today patient's wound is larger and deeper than previously evaluated. With that being said it did appear that  his wound was infected after my last evaluation with him. Subsequently I did end up prescribing a prescription for Bactrim DS which she has been taking and having no complication with. Fortunately there does not appear to be any evidence of Kowalewski, Amond ([id number]) infection at this point in time as far as anything spreading, no want to  touch, and overall I feel like things are showing signs of improvement. 01/13/18 on evaluation today patient appears to be even a little larger and deeper than last time. There still muscle exposed in the base of the wound. Nonetheless he does appear to be less erythematous I do believe inflammation is calming down also believe the infection looks like it's probably resolved at this time based on what I'm seeing. No fevers, chills, nausea, or vomiting noted at this time. 01/30/18 on evaluation today patient actually appears to visually look better for the most part. Unfortunately those visually this looks better he does seem to potentially have what may be an abscess in the muscle that has been noted in the central portion of the wound. This is the first time that I have noted what appears to be fluctuance in the central portion of the muscle. With that being said I'm somewhat more concerned about the fact that this might indicate an abscess formation at this location. I do believe that an ultrasound would be appropriate. This is likely something we need to try to do as soon as possible. He has been switch to mupirocin ointment and he is no longer using the steroid ointment as prescribed by dermatology he sees them again next week he's been decreased from 60 to 40 mg of prednisone. 03/09/18 on evaluation today patient actually appears to be doing a little better compared to last time I saw him. There's not as much erythema surrounding the wound itself. He I did review his most recent infectious disease note which was dated 02/24/18. He saw Dr. Michel Bickers in  [place]. 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 Patient is now off of antibiotics and they are continually observing this. There seems to be no change in the past two weeks in my pinion based on what the patient says and what I see today compared to what Dr. Megan Salon likely saw two weeks ago. No fevers, chills, nausea, or vomiting noted at this time. 03/23/18 on evaluation today patient's wound actually appears to be showing signs of improvement which is good news. He is currently still on the Dapsone. He is also working on tapering the prednisone to get off of this and Dr. Lottie Rater is working with him in this regard. Nonetheless overall I feel like the wound is doing well it does appear based on the infectious disease note that I reviewed from Dr. 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 I did also review the results of his ultrasound of left lower extremity which revealed there was a dentist tissue in the base of the wound without an abscess noted. 04/06/18 on evaluation today the patient's left lateral lower extremity ulcer actually appears to be doing fairly well which is excellent news. There does not appear to be any evidence of infection at this time which is also great news. Overall he still does have a significantly large ulceration although little by little he seems to be making progress. He is down to 10 mg a day of the prednisone. 04/20/18 on evaluation today patient actually appears to be doing excellent at this time in regard to his left lower extremity ulcer. He's making signs of good progress unfortunately this is taking much longer than we would really like  to see but nonetheless he is making progress. Fortunately there does not appear to be any evidence of infection at this  time. No fevers, chills, nausea, or vomiting noted at this time. The patient has not been using the Santyl due to the cost he hadn't got in this field yet. He's mainly been using the antibiotic ointment topically. Subsequently he also tells me that he really has not been scrubbing in the shower I think this would be helpful again as I told him it doesn't have to be anything too aggressive to even make it believe just enough to keep it free of some of the loose slough and biofilm on the wound surface. 05/11/18 on evaluation today patient's wound appears to be making slow but sure progress in regard to the left lateral lower extremity ulcer. He is been tolerating the dressing changes without complication. Fortunately there does not appear to be any evidence of infection at this time. He is still just using triple antibiotic ointment along with clobetasol occasionally over the area. He never got the Santyl and really does not seem to intend to in my pinion. 06/01/18 on evaluation today patient appears to be doing a little better in regard to his left lateral lower extremity ulcer. He states that overall he does not feel like he is doing as well with the Dapsone as he did with the prednisone. Nonetheless he sees his dermatologist later today and is gonna talk to them about the possibility of going back on the prednisone. Overall again I believe that the wound would be better if you would utilize Santyl but he really does not seem to be interested in going back to the [place] at this point. He has been using triple antibiotic ointment. 06/15/18 on evaluation today patient's wound actually appears to be doing about the same at this point. Fortunately there is no signs of infection at this time. He has made slight improvements although he continues to not really want to clean the wound bed at this point. He states that he just doesn't mess with it he doesn't want to cause any problems with everything else  he has going on. He has been on medication, antibiotics as prescribed by his dermatologist, for a staff infection of his lower extremities which is really drying out now and looking much better he tells me. Fortunately there is no sign of overall infection. JRAKE, RODRIQUEZ ([id number]) 06/29/18 on evaluation today patient appears to be doing well in regard to his left lateral lower surety ulcer all things considering. Fortunately his staff infection seems to be greatly improved compared to previous. He has no signs of infection and this is drying up quite nicely. He is still the doxycycline for this is no longer on cental, Dapsone, or any of the other medications. His dermatologist has recommended possibility of an infusion but right now he does not want to proceed with that. 07/13/18 on evaluation today patient appears to be doing about the same in regard to his left lateral lower surety ulcer. Fortunately there's no signs of infection at this time which is great news. Unfortunately he still builds up a significant amount of Slough/biofilm of the surface of the wound he still is not really cleaning this as he should be appropriately. Again I'm able to easily with saline and gauze remove the majority of this on the surface which if you would do this at home would likely be a dramatic improvement for him as far as getting the area  to improve. Nonetheless overall I still feel like he is making progress is just very slow. I think Santyl will be of benefit for him as well. Still he has not gotten this as of this point. 07/27/18 on evaluation today patient actually appears to be doing little worse in regards of the erythema around the periwound region of the wound he also tells me that he's been having more drainage currently compared to what he was experiencing last time I saw him. He states not quite as bad as what he had because this was infected previously but nonetheless is still appears to be doing  poorly. Fortunately there is no evidence of systemic infection at this point. The patient tells me that he is not going to be able to afford the Santyl. He is still waiting to hear about the infusion therapy with his dermatologist. Apparently she wants an updated colonoscopy first. 08/10/18 on evaluation today patient appears to be doing better in regard to his left lateral lower extremity ulcer. Fortunately he is showing signs of improvement in this regard he's actually been approved for Remicade infusion's as well although this has not been scheduled as of yet. Fortunately there's no signs of active infection at this time in regard to the wound although he is having some issues with infection of the right lower extremity is been seen as dermatologist for this. Fortunately they are definitely still working with him trying to keep things under control. Electronic Signature(s) Signed: 08/12/2018 11:01:46 PM By: Worthy Keeler PA-C Entered By: Worthy Keeler on 08/10/2018 09:02:54 GWEN, EDLER ([id number]) -------------------------------------------------------------------------------- Physical Exam Details Patient Name: [name] Date of Service: 08/10/2018 8:15 AM Medical Record Number: [id number] Patient Account Number: [id number] Date of Birth/Sex: Jun 19, 1980 (37 y.o. M) Treating RN: Harold Barban Primary Care Provider: [name] Other Clinician: Referring Provider: [name] Treating Provider/Extender: Melburn Hake, Tiegan Terpstra Weeks in Treatment: 81 Constitutional Well-nourished and well-hydrated in no acute distress. Respiratory normal breathing without difficulty. clear to auscultation bilaterally. Cardiovascular regular rate and rhythm with normal S1, S2. Psychiatric this patient is able to make decisions and demonstrates good insight into disease process. Alert and Oriented x 3. pleasant and cooperative. Notes Patient's wound does have [place] noted on the surface of the  wound which is not as significantly thick as it was previous. With that being said he still does have [place] noted on the surface of the wound which is an improvement compared to previous however where does have really thick slough and he seems to be doing much better in this regard. Electronic Signature(s) Signed: 08/12/2018 11:01:46 PM By: Worthy Keeler PA-C Entered By: Worthy Keeler on 08/10/2018 09:03:51 OMIR, COOPRIDER ([id number]) -------------------------------------------------------------------------------- Physician Orders Details Patient Name: [name] Date of Service: 08/10/2018 8:15 AM Medical Record Number: [id number] Patient Account Number: [id number] Date of Birth/Sex: Jul 13, 1980 (37 y.o. 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 Anesthetic (add to Medication List) Wound #1 Left,Lateral Lower Leg o Topical Lidocaine 4% cream applied to wound bed prior to debridement (In Clinic Only). Primary Wound Dressing Wound #1 Left,Lateral Lower Leg o Other: -  Mupirocin mix half and half with clobetasol Secondary Dressing Wound #1 Left,Lateral Lower Leg o Boardered Foam Dressing Dressing Change Frequency Wound #1 Left,Lateral Lower Leg o Change dressing every day. Follow-up Appointments Wound #1 Left,Lateral Lower Leg o Return Appointment in 2 weeks. Edema Control Wound #1 Left,Lateral Lower Leg o Patient to wear own compression stockings o Elevate legs to the level of the  heart and pump ankles as often as possible Medications-please add to medication list. Wound #1 Left,Lateral Lower Leg o P.O. Antibiotics - Prescribed by Dermatology for Staph Infection Patient Medications KEYONTA, BARRADAS ([id number]) Allergies: milk, Biaxin, seasonal Notifications Medication Indication Start End mupirocin DOSE 0 - topical 2 % ointment - ointment topical applied to the wound bed on the left and right lower leg region of infection daily Electronic Signature(s) Signed: 08/10/2018 9:06:04 AM By: Worthy Keeler PA-C Entered By: Worthy Keeler on 08/10/2018 09:06:04 [name] ([id number]) -------------------------------------------------------------------------------- Problem List Details Patient Name: [name] Date of Service: 08/10/2018 8:15 AM Medical Record Number: [id number] Patient Account Number: [id number] Date of Birth/Sex: 1981/04/10 (37 y.o. M) Treating RN: Harold Barban Primary Care Provider: [name] Other Clinician: Referring Provider: [name] Treating Provider/Extender: Melburn Hake, Shaunn Tackitt Weeks in Treatment: 45 Active Problems ICD-10 Evaluated Encounter Code Description Active Date Today Diagnosis L97.222 Non-pressure chronic ulcer of left calf with fat layer exposed 12/04/2016 No Yes L88 Pyoderma gangrenosum 03/26/2017 No Yes I87.2 Venous insufficiency (chronic) (peripheral) 12/04/2016 No Yes Inactive Problems ICD-10 Code Description Active Date Inactive Date L97.213 Non-pressure chronic ulcer of right calf with necrosis of muscle 04/02/2017 04/02/2017 Resolved Problems Electronic Signature(s) Signed: 08/12/2018 11:01:46 PM By: Worthy Keeler PA-C Entered By: Worthy Keeler on 08/10/2018 08:32:04 [name] ([id number]) -------------------------------------------------------------------------------- Progress Note Details Patient Name: [name] Date of Service: 08/10/2018 8:15 AM Medical Record Number: [id number] Patient  Account Number: [id number] Date of Birth/Sex: 10/24/1980 (37 y.o. M) Treating RN: Harold Barban Primary Care Provider: [name] Other Clinician: Referring Provider: [name] Treating Provider/Extender: Melburn Hake, Anadia Helmes Weeks in Treatment: 83 Subjective Chief Complaint Information obtained from Patient He is here in follow up evaluation for LLE pyoderma ulcer History of Present Illness (HPI) 12/04/16; 38 year old man who comes into the clinic today for review of a wound on the posterior left calf. He tells me that is been there for about a year. He is not a diabetic he does smoke half a pack per day. He was seen in the ER on 11/20/16 felt to have cellulitis around the wound and was given clindamycin. An x-ray did not show osteomyelitis. The patient initially tells me that he has a milk allergy that sets off a pruritic itching rash on his lower legs which she scratches incessantly and he thinks that's what may have set up the wound. He has been using various topical antibiotics and ointments without any effect. He works in a [employer]ing [employer] and is on his feet all day. He does not have a prior history of wounds however he does have the rash on both lower legs the right arm and the ventral aspect of his left arm. These are excoriations and clearly have had scratching however there are of macular looking areas on both legs including a substantial larger area on the right leg. This does not have an underlying open area. There is no blistering. The patient tells me that 2 years ago in [place] in response to the rash on his legs he saw a dermatologist who told him he had a  condition which may be pyoderma gangrenosum although I may be putting words into his mouth. He seemed to recognize this. 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 He is not in any treatment for this. He's had no recent travel 12/11/16; the patient arrives today with his wound and  roughly the same condition we've been using silver alginate this is a deep punched out wound with some surrounding erythema but no tenderness. Biopsy I did did not show confirmed pyoderma gangrenosum suggested nonspecific inflammation and vasculitis but does not provide an actual description of what was seen by the pathologist. I'm really not able to understand this We have also received information from the patient's dermatologist in [place] notes from April 2016. This was a doctor Agarwal- antal. 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 He was prescribed topical steroid high potency under occlusion which helped but at this point the patient did not have a deep punched out wound. 12/18/16; the patient's wound is larger in terms of surface area however this surface looks better and there is less depth. The surrounding erythema also is better. The patient states that the wrap we put on came off 2 days ago when he has been using his compression stockings. He we are in the process of getting a dermatology consult. 12/26/16 on evaluation today patient's left lower extremity wound shows evidence of infection with surrounding erythema noted. He has been tolerating the dressing changes but states that he has noted more discomfort. There is a larger area of erythema surrounding the wound. No fevers, chills, nausea, or vomiting noted at this time. With that being said the wound still does have slough covering the surface. He is not allergic to any medication that he is aware of at this point. In regard to his right lower extremity he had several regions that are erythematous and pruritic he wonders if there's anything we can do to help that. 01/02/17 I reviewed patient's wound culture which was obtained his visit last week. He was placed on doxycycline at that point. Unfortunately that does not appear to be an antibiotic that would likely help with the situation however the pseudomonas noted on  culture is sensitive to Cipro. Also unfortunately patient's wound seems to have a large compared to last week's evaluation. Not severely so but there are definitely increased measurements in general. He is continuing to have discomfort as well he writes this to be a seven out of 10. In fact he would prefer me not to perform any debridement today due to the fact that he is having discomfort and considering he has an active infection on the little reluctant to do so anyway. No fevers, Gist, [place] ([id number]) chills, nausea, or vomiting noted at this time. 01/08/17; patient seems dermatology on September 5. I suspect dermatology will want the slides from the biopsy I did sent to their pathologist. I'm not sure if there is a way we can expedite that. In any case the culture I did before I left on vacation 3 weeks ago showed Pseudomonas he was given 10 days of Cipro and per her description of her intake nurses is actually somewhat better this week although the wound is quite a bit bigger than I remember the last time I saw this. He still has 3 more days of Cipro 01/21/17; dermatology appointment tomorrow. He has completed the ciprofloxacin for Pseudomonas. Surface of the wound looks better however he is had some deterioration in the  lesions on his right leg. Meantime the left lateral leg wound we will continue with sample 01/29/17; patient had his dermatology appointment but I can't yet see that note. He is completed his antibiotics. The wound is more superficial but considerably larger in circumferential area than when he came in. This is in his left lateral calf. He also has swollen erythematous areas with superficial wounds on the right leg and small papular areas on both arms. There apparently areas in her his upper thighs and buttocks I did not look at those. Dermatology biopsied the right leg. Hopefully will have their input next week. 02/05/17; patient went back to see his dermatologist who told  him that he had a "scratching problem" as well as staph. He is now on a 30 day course of doxycycline and I believe she gave him triamcinolone cream to the right leg areas to help with the itching [not exactly sure but probably triamcinolone]. She apparently looked at the left lateral leg wound although this was not rebiopsied and I think felt to be ultimately part of the same pathogenesis. He is using sample border foam and changing nevus himself. He now has a new open area on the right posterior leg which was his biopsy site I don't have any of the dermatology notes 02/12/17; we put the patient in compression last week with SANTYL to the wound on the left leg and the biopsy. Edema is much better and the depth of the wound is now at level of skin. Area is still the same Biopsy site on the right lateral leg we've also been using santyl with a border foam dressing and he is changing this himself. 02/19/17; Using silver alginate started last week to both the substantial left leg wound and the biopsy site on the right wound. He is tolerating compression well. Has a an appointment with his primary M.D. tomorrow wondering about diuretics although I'm wondering if the edema problem is actually lymphedema 02/26/17; the patient has been to see his primary doctor Dr. Jerrel Ivory at [facility] our primary care. She started him on Lasix 20 mg and this seems to have helped with the edema. However we are not making substantial change with the left lateral calf wound and inflammation. The biopsy site on the right leg also looks stable but not really all that different. 03/12/17; the patient has been to see vein and vascular Dr. Lucky Cowboy. He has had venous reflux studies I have not reviewed these. I did get a call from his dermatology office. They felt that he might have pathergy based on their biopsy on his right leg which led them to look at the slides of the biopsy I did on the left leg and they wonder whether this  represents pyoderma gangrenosum which was the original supposition in a man with ulcerative colitis albeit inactive for many years. They therefore recommended clobetasol and tetracycline i.e. 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 03/26/17; apparently the patient just had reflux studies not an appointment with Dr. dew. She arrives in clinic today having applied clobetasol for 2-3 weeks. He notes over the last 2-3 days excessive drainage having to change the dressing 3-4 times a day and also expanding erythema. He states the expanding erythema seems to come and go and was last this red was earlier in the month.he is on doxycycline 150 mg twice a day as an anti-inflammatory systemic therapy for possible pyoderma gangrenosum along with the topical clobetasol 04/02/17; the patient was seen last week by  Dr. Lillia Carmel at [facility] dermatology locally who kindly saw him at my request. 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 My concern was the degree of erythema medially extending from his left leg wound which was either inflammation from pyoderma or cellulitis. I put him on Augmentin however culture of the wound showed Pseudomonas which is quinolone sensitive. I really don't believe he has cellulitis however in view of everything I will continue and give him a course of Cipro. He is also on doxycycline as an immune modulator for the pyoderma. In addition to his original wound on the left lateral leg with surrounding erythema he has a wound on the right posterior calf which was an original biopsy site done by dermatology. 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 Saw Dr. Lillia Carmel yesterday. He has been using topical antibiotics to both wound areas his original wound on the left and the biopsies/pathergy area on the right. There is definitely some improvement in  the inflammation around the wound on the right although the patient states he has increasing sensitivity of the wounds. He is on prednisone 60 and doxycycline 1 as prescribed by Dr. Lillia Carmel. He is covering the topical antibiotic with gauze and putting this in his own compression stocks and changing this daily. He states that Dr. Lottie Rater did a culture of the left leg wound yesterday 05/07/17; pyoderma gangrenosum. The patient saw Dr. Lillia Carmel yesterday and has a follow-up with her in one month. He is still using topical antibiotics to both wounds although he can't recall exactly what type. He is still on prednisone 60 mg. Dr. Lillia Carmel stated that the doxycycline could stop if we were in agreement. 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 The right medial leg was induced by [name] ([id number]) biopsy/pathergy. The area on the right is essentially healed. 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 The right medial leg has closed and remains closed. He is still on prednisone 60. He tells me he missed his last dermatology appointment with Dr. Lillia Carmel but will make another appointment. He reports that her blood sugar at a recent screen in [place] was high 200's. He was 180 today. 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 I think he is going to require still much longer prednisone perhaps another 3 months before attempting to taper. In the meantime his wound is a lot better. Smaller. He is cleaning this off daily and applying topical antibiotics. When he was last in the clinic I thought about changing to [facility] and actually put in a couple of calls to dermatology although probably not during their business hours. In any case the wound looks better smaller I don't think there is any  need to change what he is doing 08/06/17-he is here in follow up evaluation for pyoderma left leg ulcer. He continues on oral prednisone. He has been using triple antibiotic ointment. There is surface debris and we will transition to [facility] and have him return in 2 weeks. He has lost 30 pounds since his last appointment with lifestyle modification. He may benefit from topical steroid cream for treatment this can be considered at a later date. 08/22/17 on evaluation today patient appears to actually be doing rather well in regard to his  left lateral lower extremity ulcer. He has actually been managed by Dr. Dellia Nims most recently. Patient is currently on oral steroids at this time. This seems to have been of benefit for him. Nonetheless his last visit was actually with Leah on 08/06/17. Currently he is not utilizing any topical steroid creams although this could be of benefit as well. No fevers, chills, nausea, or vomiting noted at this time. 09/05/17 on evaluation today patient appears to be doing better in regard to his left lateral lower extremity ulcer. He has been tolerating the dressing changes without complication. He is using Santyl with good effect. Overall I'm very pleased with how things are standing at this point. Patient likewise is happy that this is doing better. 09/19/17 on evaluation today patient actually appears to be doing rather well in regard to his left lateral lower extremity ulcer. 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 He's not having any significant pain. 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 He has been tolerating the Santyl without complication and in general I feel like he's making good progress. 10/17/17 on evaluation today patient appears to be doing very well in regard to his left  lateral lower surety ulcer. He has been tolerating the dressing changes without complication. There does not appear to be any evidence of infection he's alternating the Santyl and the triple antibiotic ointment every other day this seems to be doing well for him. 11/03/17 on evaluation today patient appears to be doing very well in regard to his left lateral lower extremity ulcer. He is been tolerating the dressing changes without complication which is good news. Fortunately there does not appear to be any evidence of infection which is also great news. Overall is doing excellent they are starting to taper down on the prednisone is down to 40 mg at this point it also started topical clobetasol for him. 11/17/17 on evaluation today patient appears to be doing well in regard to his left lateral lower surety ulcer. He's been tolerating the dressing changes without complication. He does note that he is having no pain, no excessive drainage or discharge, and overall he feels like things are going about how he would expect and hope they would. Overall he seems to have no evidence of infection at this time in my opinion which is good news. 12/04/17-He is seen in follow-up evaluation for right lateral lower extremity ulcer. He has been applying topical steroid cream. Today's measurement show slight increase in size. Over the next 2 weeks we will transition to every other day Santyl and steroid cream. He has been encouraged to monitor for changes and notify clinic with any concerns 12/15/17 on evaluation today patient's left lateral motion the ulcer and fortunately is doing worse again at this point. This just since last week to this week has close to doubled in size according to the patient. I did not seeing last week's I do not have a visual to compare this to in our system was also down so we do not have all the charts and at this point. Nonetheless it does have me somewhat concerned in regard to the fact that  again he was worried enough about it he has contact the dermatology that placed them back on the full strength, 50 mg a day of the prednisone that he  was taken previous. He continues to alternate using clobetasol along with Santyl at this point. He is obviously somewhat frustrated. 12/22/17 on evaluation today patient appears to be doing a little worse compared to last evaluation. Unfortunately the wound is a little deeper and slightly larger than the last week's evaluation. With that being said he has made some progress in regard to the irritation surrounding at this time unfortunately despite that progress that's been made he still has a significant issue going on here. I'm not certain that he is having really any true infection at this time although with the Pyoderma gangrenosum it can [place], [place] ([id number]) sometimes be difficult to differentiate infection versus just inflammation. For that reason I discussed with him today the possibility of perform a wound culture to ensure there's nothing overtly infected. 01/06/18 on evaluation today patient's wound is larger and deeper than previously evaluated. With that being said it did appear that his wound was infected after my last evaluation with him. Subsequently I did end up prescribing a prescription for Bactrim DS which she has been taking and having no complication with. Fortunately there does not appear to be any evidence of infection at this point in time as far as anything spreading, no want to touch, and overall I feel like things are showing signs of improvement. 01/13/18 on evaluation today patient appears to be even a little larger and deeper than last time. There still muscle exposed in the base of the wound. Nonetheless he does appear to be less erythematous I do believe inflammation is calming down also believe the infection looks like it's probably resolved at this time based on what I'm seeing. No fevers, chills, nausea, or vomiting  noted at this time. 01/30/18 on evaluation today patient actually appears to visually look better for the most part. Unfortunately those visually this looks better he does seem to potentially have what may be an abscess in the muscle that has been noted in the central portion of the wound. This is the first time that I have noted what appears to be fluctuance in the central portion of the muscle. With that being said I'm somewhat more concerned about the fact that this might indicate an abscess formation at this location. I do believe that an ultrasound would be appropriate. This is likely something we need to try to do as soon as possible. He has been switch to mupirocin ointment and he is no longer using the steroid ointment as prescribed by dermatology he sees them again next week he's been decreased from 60 to 40 mg of prednisone. 03/09/18 on evaluation today patient actually appears to be doing a little better compared to last time I saw him. There's not as much erythema surrounding the wound itself. He I did review his most recent infectious disease note which was dated 02/24/18. He saw Dr. Michel Bickers in [place]. 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 Patient is now off of antibiotics and they are continually observing this. There seems to be no change in the past two weeks in my pinion based on what the patient says and what I see today compared to what Dr. Megan Salon likely saw two weeks ago. No fevers, chills, nausea, or vomiting noted at this time. 03/23/18 on evaluation today patient's wound actually appears to be showing signs of improvement which is good news. He is currently still on the  Dapsone. He is also working on tapering the prednisone to get off of this and Dr. Lottie Rater is working with him in this regard. Nonetheless overall I feel like the wound is doing well it does appear based on the  infectious disease note that I reviewed from Dr. 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 I did also review the results of his ultrasound of left lower extremity which revealed there was a dentist tissue in the base of the wound without an abscess noted. 04/06/18 on evaluation today the patient's left lateral lower extremity ulcer actually appears to be doing fairly well which is excellent news. There does not appear to be any evidence of infection at this time which is also great news. Overall he still does have a significantly large ulceration although little by little he seems to be making progress. He is down to 10 mg a day of the prednisone. 04/20/18 on evaluation today patient actually appears to be doing excellent at this time in regard to his left lower extremity ulcer. He's making signs of good progress unfortunately this is taking much longer than we would really like to see but nonetheless he is making progress. Fortunately there does not appear to be any evidence of infection at this time. No fevers, chills, nausea, or vomiting noted at this time. The patient has not been using the Santyl due to the cost he hadn't got in this field yet. He's mainly been using the antibiotic ointment topically. Subsequently he also tells me that he really has not been scrubbing in the shower I think this would be helpful again as I told him it doesn't have to be anything too aggressive to even make it believe just enough to keep it free of some of the loose slough and biofilm on the wound surface. 05/11/18 on evaluation today patient's wound appears to be making slow but sure progress in regard to the left lateral lower extremity ulcer. He is been tolerating the dressing changes without complication. Fortunately there does not appear to be any evidence of infection at this time. He is still  just using triple antibiotic ointment along with clobetasol occasionally over the area. He never got the Santyl and really does not seem to intend to in my pinion. 06/01/18 on evaluation today patient appears to be doing a little better in regard to his left lateral lower extremity ulcer. He states that overall he does not feel like he is doing as well with the Dapsone as he did with the prednisone. Nonetheless he sees his dermatologist later today and is gonna talk to them about the possibility of going back on the prednisone. Overall again I believe that the wound would be better if you would utilize Santyl but he really does not seem to be interested in going back to the [place] at this point. He has been using triple antibiotic ointment. ESPEN, BETHEL ([id number]) 06/15/18 on evaluation today patient's wound actually appears to be doing about the same at this point. Fortunately there is no signs of infection at this time. He has made slight improvements although he continues to not really want to clean the wound bed at this point. He states that he just doesn't mess with it he doesn't want to cause any problems with everything else he has going on. He has been on medication, antibiotics as prescribed by his dermatologist,  for a staff infection of his lower extremities which is really drying out now and looking much better he tells me. Fortunately there is no sign of overall infection. 06/29/18 on evaluation today patient appears to be doing well in regard to his left lateral lower surety ulcer all things considering. Fortunately his staff infection seems to be greatly improved compared to previous. He has no signs of infection and this is drying up quite nicely. He is still the doxycycline for this is no longer on cental, Dapsone, or any of the other medications. His dermatologist has recommended possibility of an infusion but right now he does not want to proceed with that. 07/13/18 on evaluation  today patient appears to be doing about the same in regard to his left lateral lower surety ulcer. Fortunately there's no signs of infection at this time which is great news. Unfortunately he still builds up a significant amount of Slough/biofilm of the surface of the wound he still is not really cleaning this as he should be appropriately. Again I'm able to easily with saline and gauze remove the majority of this on the surface which if you would do this at home would likely be a dramatic improvement for him as far as getting the area to improve. Nonetheless overall I still feel like he is making progress is just very slow. I think Santyl will be of benefit for him as well. Still he has not gotten this as of this point. 07/27/18 on evaluation today patient actually appears to be doing little worse in regards of the erythema around the periwound region of the wound he also tells me that he's been having more drainage currently compared to what he was experiencing last time I saw him. He states not quite as bad as what he had because this was infected previously but nonetheless is still appears to be doing poorly. Fortunately there is no evidence of systemic infection at this point. The patient tells me that he is not going to be able to afford the Santyl. He is still waiting to hear about the infusion therapy with his dermatologist. Apparently she wants an updated colonoscopy first. 08/10/18 on evaluation today patient appears to be doing better in regard to his left lateral lower extremity ulcer. Fortunately he is showing signs of improvement in this regard he's actually been approved for Remicade infusion's as well although this has not been scheduled as of yet. Fortunately there's no signs of active infection at this time in regard to the wound although he is having some issues with infection of the right lower extremity is been seen as dermatologist for this. Fortunately they are definitely still  working with him trying to keep things under control. Patient History Information obtained from Patient. Family History Diabetes - Mother,Maternal Grandparents, Heart Disease - Mother,Maternal Grandparents, Hereditary Spherocytosis - Siblings, Hypertension - Maternal Grandparents, Stroke - Siblings, No family history of Cancer, Kidney Disease, Lung Disease, Seizures, Thyroid Problems, Tuberculosis. Social History Current some day smoker, Marital Status - Married, Alcohol Use - Rarely, Drug Use - No History, Caffeine Use - Moderate. Medical History Eyes Denies history of Cataracts, Glaucoma, Optic Neuritis Ear/Nose/Mouth/Throat Denies history of Chronic sinus problems/congestion, Middle ear problems Hematologic/Lymphatic Denies history of Anemia, Hemophilia, Human Immunodeficiency Virus, Lymphedema, Sickle Cell Disease Respiratory Patient has history of Sleep Apnea - cpap Denies history of Aspiration, Asthma, Chronic Obstructive Pulmonary Disease (COPD), Pneumothorax Cardiovascular Patient has history of Hypertension Denies history of Angina, Arrhythmia, Congestive Heart Failure, Coronary Artery  Disease, Deep Vein Thrombosis, Hypotension, Myocardial Infarction, Peripheral Arterial Disease, Peripheral Venous Disease, Phlebitis, Vasculitis Gastrointestinal Patient has history of Colitis Trott, Earmon ([id number]) Endocrine Denies history of Type I Diabetes, Type II Diabetes Genitourinary Denies history of End Stage Renal Disease Immunological Denies history of Lupus Erythematosus, Raynaud s, Scleroderma Integumentary (Skin) Denies history of History of Burn, History of pressure wounds Musculoskeletal Denies history of Gout, Rheumatoid Arthritis, Osteoarthritis, Osteomyelitis Neurologic Denies history of Dementia, Neuropathy, Quadriplegia, Paraplegia, Seizure Disorder Oncologic Denies history of Received Chemotherapy, Received Radiation Psychiatric Denies history of  Anorexia/bulimia, Confinement Anxiety Review of Systems (ROS) Constitutional Symptoms (General Health) Denies complaints or symptoms of Fever, Chills. Respiratory The patient has no complaints or symptoms. Cardiovascular The patient has no complaints or symptoms. Psychiatric The patient has no complaints or symptoms. Objective Constitutional Well-nourished and well-hydrated in no acute distress. Vitals Time Taken: 8:24 AM, Height: 71 in, Weight: 338 lbs, BMI: 47.1, Temperature: 98.6 F, Pulse: 87 bpm, Respiratory Rate: 16 breaths/min, Blood Pressure: 132/86 mmHg. Respiratory normal breathing without difficulty. clear to auscultation bilaterally. Cardiovascular regular rate and rhythm with normal S1, S2. Psychiatric this patient is able to make decisions and demonstrates good insight into disease process. Alert and Oriented x 3. pleasant and cooperative. General Notes: Patient's wound does have [employer] noted on the surface of the wound which is not as significantly thick as it was previous. With that being said he still does have [place] noted on the surface of the wound which is an improvement compared to previous however where does have really thick slough and he seems to be doing much better in this regard. Integumentary (Hair, Skin) Popwell, Regginald ([id number]) Wound #1 status is Open. Original cause of wound was Gradually Appeared. The wound is located on the Left,Lateral Lower Leg. The wound measures 2.7cm length x 3.4cm width x 0.2cm depth; 7.21cm^2 area and 1.442cm^3 volume. There is Fat Layer (Subcutaneous Tissue) Exposed exposed. There is no tunneling or undermining noted. There is a medium amount of purulent drainage noted. The wound margin is distinct with the outline attached to the wound base. There is no granulation within the wound bed. There is a large (67-100%) amount of necrotic tissue within the wound bed including Adherent Slough. The periwound skin appearance  exhibited: Induration, Scarring, Dry/Scaly, Ecchymosis, Hemosiderin Staining. The periwound skin appearance did not exhibit: Callus, Crepitus, Excoriation, Rash, Maceration, Atrophie Blanche, Cyanosis, Mottled, Pallor, Rubor, Erythema. Periwound temperature was noted as No Abnormality. The periwound has tenderness on palpation. 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 Anesthetic (add to Medication List): Wound #1 Left,Lateral Lower Leg: Topical Lidocaine 4% cream applied to wound bed prior to debridement (In Clinic Only). Primary Wound Dressing: Wound #1 Left,Lateral Lower Leg: Other: - Mupirocin mix half and half with clobetasol Secondary Dressing: Wound #1 Left,Lateral Lower Leg: Boardered Foam Dressing Dressing Change Frequency: Wound #1 Left,Lateral Lower Leg: Change dressing every day. Follow-up Appointments: Wound #1 Left,Lateral Lower Leg: Return Appointment in 2 weeks. Edema Control: Wound #1 Left,Lateral Lower Leg: Patient to wear own compression stockings Elevate legs to the level of the heart and pump ankles as often as possible Medications-please add to medication list.: Wound #1 Left,Lateral Lower Leg: P.O. Antibiotics - Prescribed by Dermatology for Staph Infection The following medication(s) was prescribed: mupirocin topical 2 % ointment 0 ointment  topical applied to the wound bed on the left and right lower leg region of infection daily Mathwig, Felder ([id number]) My suggestion currently is gonna be that we go ahead and initiate the above wound care measures for the next week and the patient is in agreement with plan. We will subsequently see were things stand at follow-up. If anything changes or worsens in the meantime he  will let me know. Otherwise we will continue with the above orders. Please see above for specific wound care orders. We will see patient for re-evaluation in 2 week(s) here in the clinic. If anything worsens or changes patient will contact our office for additional recommendations. Electronic Signature(s) Signed: 08/12/2018 11:01:46 PM By: Worthy Keeler PA-C Entered By: Worthy Keeler on 08/10/2018 09:06:28 CEVIN, RUBINSTEIN ([id number]) -------------------------------------------------------------------------------- ROS/PFSH Details Patient Name: [name] Date of Service: 08/10/2018 8:15 AM Medical Record Number: [id number] Patient Account Number: [id number] Date of Birth/Sex: 11-Nov-1980 (37 y.o. M) Treating RN: Harold Barban Primary Care Provider: [name] Other Clinician: Referring Provider: [name] Treating Provider/Extender: Melburn Hake, Russie Gulledge Weeks in Treatment: 64 Information Obtained From Patient Wound History Do you currently have one or more open woundso Yes How many open wounds do you currently haveo 1 Approximately how long have you had your woundso 1 year How have you been treating your wound(s) until nowo neosporin Has your wound(s) ever healed and then re-openedo No Have you had any lab work done in the past montho No Have you tested positive for an antibiotic resistant organism (MRSA, VRE)o No Have you tested positive for osteomyelitis (bone infection)o No Have you had any tests for circulation on your legso No Have you had other problems associated with your woundso Infection, Swelling Constitutional Symptoms (General Health) Complaints and Symptoms: Negative for: Fever; Chills Eyes Medical History: Negative for: Cataracts; Glaucoma; Optic Neuritis Ear/Nose/Mouth/Throat Medical History: Negative for: Chronic sinus problems/congestion; Middle ear problems Hematologic/Lymphatic Medical History: Negative for: Anemia; Hemophilia; Human  Immunodeficiency Virus; Lymphedema; Sickle Cell Disease Respiratory Complaints and Symptoms: No Complaints or Symptoms Medical History: Positive for: Sleep Apnea - cpap Negative for: Aspiration; Asthma; Chronic Obstructive Pulmonary Disease (COPD); Pneumothorax Cardiovascular Complaints and Symptoms: No Complaints or Symptoms Medical HistoryGARLAN, DREWES ([id number]) Positive for: Hypertension Negative for: Angina; Arrhythmia; Congestive Heart Failure; Coronary Artery Disease; Deep Vein Thrombosis; Hypotension; Myocardial Infarction; Peripheral Arterial Disease; Peripheral Venous Disease; Phlebitis; Vasculitis Gastrointestinal Medical History: Positive for: Colitis Endocrine Medical History: Negative for: Type I Diabetes; Type II Diabetes Genitourinary Medical History: Negative for: End Stage Renal Disease Immunological Medical History: Negative for: Lupus Erythematosus; Raynaudos; Scleroderma Integumentary (Skin) Medical History: Negative for: History of Burn; History of pressure wounds Musculoskeletal Medical History: Negative for: Gout; Rheumatoid Arthritis; Osteoarthritis; Osteomyelitis Neurologic Medical History: Negative for: Dementia; Neuropathy; Quadriplegia; Paraplegia; Seizure Disorder Oncologic Medical History: Negative for: Received Chemotherapy; Received Radiation Psychiatric Complaints and Symptoms: No Complaints or Symptoms Medical History: Negative for: Anorexia/bulimia; Confinement Anxiety Immunizations Pneumococcal Vaccine: Received Pneumococcal Vaccination: No Implantable Devices No devices added TRENNON, TORBECK ([id number]) Family and Social History Cancer: No; Diabetes: Yes - Mother,Maternal Grandparents; Heart Disease: Yes - Mother,Maternal Grandparents; Hereditary Spherocytosis: Yes - Siblings; Hypertension: Yes - Maternal Grandparents; Kidney Disease: No; Lung Disease: No; Seizures: No; Stroke: Yes - Siblings; Thyroid Problems: No;  Tuberculosis: No; Current some day smoker; Marital Status - Married; Alcohol Use: Rarely; Drug Use: No History; Caffeine Use: Moderate; Financial Concerns: No; Food, Clothing or Shelter Needs: No; Support System Lacking: No; Transportation Concerns: No; Advanced Directives: No; Patient  does not want information on Advanced Directives; Living Will: No Physician Affirmation I have reviewed and agree with the above information. Electronic Signature(s) Signed: 08/11/2018 9:04:30 AM By: Harold Barban Signed: 08/12/2018 11:01:46 PM By: Worthy Keeler PA-C Entered By: Worthy Keeler on 08/10/2018 09:03:20 ABDALLAH, HERN ([id number]) -------------------------------------------------------------------------------- SuperBill Details Patient Name: [name] Date of Service: 08/10/2018 Medical Record Number: [id number] Patient Account Number: [id number] Date of Birth/Sex: 06-19-1980 (37 y.o. M) Treating RN: Harold Barban Primary Care Provider: [name] Other Clinician: Referring Provider: [name] Treating Provider/Extender: Melburn Hake, Jaedynn Bohlken Weeks in Treatment: 80 Diagnosis Coding ICD-10 Codes Code Description [phone] Non-pressure chronic ulcer of left calf with fat layer exposed L88 Pyoderma gangrenosum I87.2 Venous insufficiency (chronic) (peripheral) Facility Procedures CPT4 Code: [id number] Description: 94320 - WOUND CARE VISIT-LEV 2 EST PT Modifier: Quantity: 1 Physician Procedures CPT4 Code: [id number] Description: 61901 - WC PHYS LEVEL 4 - EST PT ICD-10 Diagnosis Description L97.222 Non-pressure chronic ulcer of left calf with fat layer expo L88 Pyoderma gangrenosum I87.2 Venous insufficiency (chronic) (peripheral) Modifier: sed Quantity: 1 Electronic Signature(s) Signed: 08/12/2018 11:01:46 PM By: Worthy Keeler PA-C Entered By: Worthy Keeler on 08/10/2018 09:06:39

## 2018-08-13 NOTE — Progress Notes (Signed)
TREVELLE, MCGURN ([id number]) Visit Report for 08/10/2018 Arrival Information Details Patient Name: [name], [name] Date of Service: 08/10/2018 8:15 AM Medical Record Number: [id number] Patient Account Number: [id number] Date of Birth/Sex: 1981/04/02 (38 y.o. M) Treating RN: Harold Barban Primary Care Tallis Soledad: [name] Other Clinician: Referring Arcadio Cope: [name] Treating Dorsel Flinn/Extender: Melburn Hake, [name]: 47 Visit Information History Since Last Visit Added or deleted any medications: No Patient Arrived: Ambulatory Any new allergies or adverse reactions: No Arrival Time: 08:23 Had a fall or experienced change in No Accompanied By: self activities of daily living that may affect Transfer Assistance: Manual risk of falls: Patient Requires Transmission-Based No Signs or symptoms of abuse/neglect since last visito No Precautions: Hospitalized since last visit: No Patient Has Alerts: No Implantable device outside of the clinic excluding No cellular tissue based products placed in the center since last visit: Has Dressing in Place as Prescribed: Yes Pain Present Now: No Electronic Signature(s) Signed: 08/10/2018 4:01:59 PM By: Lorine Bears RCP, RRT, CHT Entered By: Lorine Bears on 08/10/2018 08:24:17 [name] ([id number]) -------------------------------------------------------------------------------- Clinic Level of Care Assessment Details Patient Name: [name] Date of Service: 08/10/2018 8:15 AM Medical Record Number: [id number] Patient Account Number: [id number] Date of Birth/Sex: Apr 23, 1981 (37 y.o. M) Treating RN: Harold Barban Primary Care Mariaguadalupe Fialkowski: [name] Other Clinician: Referring Adaline Trejos: [name] Treating Rasean Joos/Extender: Melburn Hake, [name]: 29 Clinic Level of Care Assessment Items TOOL 4 Quantity Score []  - Use when only an EandM is performed on FOLLOW-UP  visit 0 ASSESSMENTS - Nursing Assessment / Reassessment X - Reassessment of Co-morbidities (includes updates in patient status) 1 10 X- 1 5 Reassessment of Adherence to Treatment Plan ASSESSMENTS - Wound and Skin Assessment / Reassessment X - Simple Wound Assessment / Reassessment - one wound 1 5 []  - 0 Complex Wound Assessment / Reassessment - multiple wounds []  - 0 Dermatologic / Skin Assessment (not related to wound area) ASSESSMENTS - Focused Assessment []  - Circumferential Edema Measurements - multi extremities 0 []  - 0 Nutritional Assessment / Counseling / Intervention []  - 0 Lower Extremity Assessment (monofilament, tuning fork, pulses) []  - 0 Peripheral Arterial Disease Assessment (using hand held doppler) ASSESSMENTS - Ostomy and/or Continence Assessment and Care []  - Incontinence Assessment and Management 0 []  - 0 Ostomy Care Assessment and Management (repouching, etc.) PROCESS - Coordination of Care X - Simple Patient / Family Education for ongoing care 1 15 []  - 0 Complex (extensive) Patient / Family Education for ongoing care []  - 0 Staff obtains Programmer, systems, Records, Test Results / Process Orders []  - 0 Staff telephones HHA, Nursing Homes / Clarify orders / etc []  - 0 Routine Transfer to another Facility (non-emergent condition) []  - 0 Routine Hospital Admission (non-emergent condition) []  - 0 New Admissions / Biomedical engineer / Ordering NPWT, Apligraf, etc. []  - 0 Emergency Hospital Admission (emergent condition) X- 1 10 Simple Discharge Coordination Rison, Jonathyn ([id number]) []  - 0 Complex (extensive) Discharge Coordination PROCESS - Special Needs []  - Pediatric / Minor Patient Management 0 []  - 0 Isolation Patient Management []  - 0 Hearing / Language / Visual special needs []  - 0 Assessment of Community assistance (transportation, D/C planning, etc.) []  - 0 Additional assistance / Altered mentation []  - 0 Support Surface(s) Assessment (bed,  cushion, seat, etc.) INTERVENTIONS - Wound Cleansing / Measurement X - Simple Wound Cleansing - one wound 1 5 []  - 0 Complex Wound Cleansing - multiple wounds X- 1 5 Wound Imaging (  photographs - any number of wounds) []  - 0 Wound Tracing (instead of photographs) X- 1 5 Simple Wound Measurement - one wound []  - 0 Complex Wound Measurement - multiple wounds INTERVENTIONS - Wound Dressings X - Small Wound Dressing one or multiple wounds 1 10 []  - 0 Medium Wound Dressing one or multiple wounds []  - 0 Large Wound Dressing one or multiple wounds []  - 0 Application of Medications - topical []  - 0 Application of Medications - injection INTERVENTIONS - Miscellaneous []  - External ear exam 0 []  - 0 Specimen Collection (cultures, biopsies, blood, body fluids, etc.) []  - 0 Specimen(s) / Culture(s) sent or taken to Lab for analysis []  - 0 Patient Transfer (multiple staff / Civil Service fast streamer / Similar devices) []  - 0 Simple Staple / Suture removal (25 or less) []  - 0 Complex Staple / Suture removal (26 or more) []  - 0 Hypo / Hyperglycemic Management (close monitor of Blood Glucose) []  - 0 Ankle / Brachial Index (ABI) - do not check if billed separately X- 1 5 Vital Signs Caulfield, Casten ([id number]) Has the patient been seen at the hospital within the last three years: Yes Total Score: 75 Level Of Care: New/Established - Level 2 Electronic Signature(s) Signed: 08/11/2018 9:04:30 AM By: Harold Barban Entered By: Harold Barban on 08/10/2018 08:51:25 [name] ([id number]) -------------------------------------------------------------------------------- Encounter Discharge Information Details Patient Name: [name] Date of Service: 08/10/2018 8:15 AM Medical Record Number: [id number] Patient Account Number: [id number] Date of Birth/Sex: 1980-11-28 (37 y.o. M) Treating RN: Army Melia Primary Care Azelia Reiger: [name] Other Clinician: Referring Kimisha Eunice: [name] Treating Shon Mansouri/Extender: Melburn Hake, [name]: 25 Encounter Discharge Information Items Discharge Condition: Stable Ambulatory Status: Ambulatory Discharge Destination: Home Transportation: Private Auto Accompanied By: self Schedule Follow-up Appointment: Yes Clinical Summary of Care: Electronic Signature(s) Signed: 08/10/2018 11:56:50 AM By: Army Melia Entered By: Army Melia on 08/10/2018 09:07:56 [name] ([id number]) -------------------------------------------------------------------------------- Lower Extremity Assessment Details Patient Name: [name] Date of Service: 08/10/2018 8:15 AM Medical Record Number: [id number] Patient Account Number: [id number] Date of Birth/Sex: 1981-05-19 (37 y.o. M) Treating RN: Montey Hora Primary Care Magdeline Prange: [name] Other Clinician: Referring Hilmer Aliberti: [name] Treating Quoc Tome/Extender: Melburn Hake, [name]: 64 Vascular Assessment Pulses: Dorsalis Pedis Palpable: [Left:Yes] Extremity colors, hair growth, and conditions: Extremity Color: [Left:Hyperpigmented] Hair Growth on Extremity: [Left:Yes] Temperature of Extremity: [Left:Warm < 3 seconds] Electronic Signature(s) Signed: 08/10/2018 3:31:49 PM By: Montey Hora Entered By: Montey Hora on 08/10/2018 08:36:32 Mccain, [name] ([id number]) -------------------------------------------------------------------------------- Multi Wound Chart Details Patient Name: [name] Date of Service: 08/10/2018 8:15 AM Medical Record Number: [id number] Patient Account Number: [id number] Date of Birth/Sex: [birth year] (37 y.o. M) Treating RN: Harold Barban Primary Care Giliana Vantil: [name] Other Clinician: Referring Keysha Damewood: [name] Treating Dail Meece/Extender: Melburn Hake, [name]: 52 Vital Signs Height(in): 71 Pulse(bpm): 87 Weight(lbs): 338 Blood Pressure(mmHg): 132/86 Body Mass  Index(BMI): 47 Temperature(F): 98.6 Respiratory Rate 16 (breaths/min): Photos: [N/A:N/A] Wound Location: Left Lower Leg - Lateral N/A N/A Wounding Event: Gradually Appeared N/A N/A Primary Etiology: Pyoderma N/A N/A Comorbid History: Sleep Apnea, Hypertension, N/A N/A Colitis Date Acquired: 11/18/2015 N/A N/A Weeks of Treatment: 87 N/A N/A Wound Status: Open N/A N/A Measurements L x W x D 2.7x3.4x0.2 N/A N/A (cm) Area (cm) : 7.21 N/A N/A Volume (cm) : 1.442 N/A N/A % Reduction in Area: -46.90% N/A N/A % Reduction in Volume: 63.30% N/A N/A Classification: Full Thickness With Exposed N/A N/A Support Structures Exudate Amount:  Medium N/A N/A Exudate Type: Purulent N/A N/A Exudate Color: yellow, brown, green N/A N/A Wound Margin: Distinct, outline attached N/A N/A Granulation Amount: None Present (0%) N/A N/A Necrotic Amount: Large (67-100%) N/A N/A Exposed Structures: Fat Layer (Subcutaneous N/A N/A Tissue) Exposed: Yes Fascia: No Tendon: No Muscle: No Joint: No Bone: No Flegel, [place] ([id number]) Epithelialization: Small (1-33%) N/A N/A Periwound Skin Texture: Induration: Yes N/A N/A Scarring: Yes Excoriation: No Callus: No Crepitus: No Rash: No Periwound Skin Moisture: Dry/Scaly: Yes N/A N/A Maceration: No Periwound Skin Color: Ecchymosis: Yes N/A N/A Hemosiderin Staining: Yes Atrophie Blanche: No Cyanosis: No Erythema: No Mottled: No Pallor: No Rubor: No Temperature: No Abnormality N/A N/A Tenderness on Palpation: Yes N/A N/A Wound Preparation: Ulcer Cleansing: N/A N/A Rinsed/Irrigated with Saline Topical Anesthetic Applied: Other: lidocaine 4% Treatment Notes Electronic Signature(s) Signed: 08/11/2018 9:04:30 AM By: Harold Barban Entered By: Harold Barban on 08/10/2018 08:50:22 ZACORY, FIOLA ([id number]) -------------------------------------------------------------------------------- Multi-Disciplinary Care Plan Details Patient Name: [name] Date of Service: 08/10/2018 8:15 AM Medical Record Number: [id number] Patient Account Number: [id number] Date of Birth/Sex: 12-31-1980 (37 y.o. M) Treating RN: Harold Barban Primary Care Mohid Furuya: [name] Other Clinician: Referring Mckaela Howley: [name] Treating Estevan Kersh/Extender: Melburn Hake, [name]: 75 Active Inactive Orientation to the Wound Care Program Nursing Diagnoses: Knowledge deficit related to the wound healing center program Goals: Patient/caregiver will verbalize understanding of the [place] Program Date Initiated: 12/11/2016 Target Resolution Date: 03/13/2017 Goal Status: Active Interventions: Provide education on orientation to the wound center Notes: Venous Leg Ulcer Nursing Diagnoses: Knowledge deficit related to disease process and management Potential for venous Insuffiency (use before diagnosis confirmed) Goals: Non-invasive venous studies are completed as ordered Date Initiated: 12/11/2016 Target Resolution Date: 03/13/2017 Goal Status: Active Patient will maintain optimal edema control Date Initiated: 12/11/2016 Target Resolution Date: 03/13/2017 Goal Status: Active Patient/caregiver will verbalize understanding of disease process and disease management Date Initiated: 12/11/2016 Target Resolution Date: 03/13/2017 Goal Status: Active Verify adequate tissue perfusion prior to therapeutic compression application Date Initiated: 12/11/2016 Target Resolution Date: 03/13/2017 Goal Status: Active Interventions: Assess peripheral edema status every visit. Compression as ordered Provide education on venous insufficiency Treatment Activities: LATRAVIS, GRINE ([id number]) Therapeutic compression applied : 12/11/2016 Notes: Wound/Skin Impairment Nursing Diagnoses: Impaired tissue integrity Knowledge deficit related to ulceration/compromised skin integrity Goals: Patient/caregiver will verbalize understanding of  skin care regimen Date Initiated: 12/11/2016 Target Resolution Date: 03/13/2017 Goal Status: Active Ulcer/skin breakdown will have a volume reduction of 30% by week 4 Date Initiated: 12/11/2016 Target Resolution Date: 03/13/2017 Goal Status: Active Ulcer/skin breakdown will have a volume reduction of 50% by week 8 Date Initiated: 12/11/2016 Target Resolution Date: 03/13/2017 Goal Status: Active Ulcer/skin breakdown will have a volume reduction of 80% by week 12 Date Initiated: 12/11/2016 Target Resolution Date: 03/13/2017 Goal Status: Active Ulcer/skin breakdown will heal within 14 weeks Date Initiated: 12/11/2016 Target Resolution Date: 03/13/2017 Goal Status: Active Interventions: Assess patient/caregiver ability to obtain necessary supplies Assess patient/caregiver ability to perform ulcer/skin care regimen upon admission and as needed Assess ulceration(s) every visit Provide education on ulcer and skin care Treatment Activities: Skin care regimen initiated : 12/11/2016 Topical wound management initiated : 12/11/2016 Notes: Electronic Signature(s) Signed: 08/11/2018 9:04:30 AM By: Harold Barban Entered By: Harold Barban on 08/10/2018 [place], [name] ([id number]) -------------------------------------------------------------------------------- Pain Assessment Details Patient Name: [name] Date of Service: 08/10/2018 8:15 AM Medical Record Number: [id number] Patient Account Number: [id number] Date of Birth/Sex: 05-14-1981 (37 y.o. M) Treating RN: Harold Barban  Primary Care Milt Coye: [name] Other Clinician: Referring Eh Sesay: [name] Treating Marigny Borre/Extender: Melburn Hake, [name]: 53 Active Problems Location of Pain Severity and Description of Pain Patient Has Paino No Site Locations Pain Management and Medication Current Pain Management: Electronic Signature(s) Signed: 08/10/2018 4:01:59 PM By: Lorine Bears  RCP, RRT, CHT Signed: 08/11/2018 9:04:30 AM By: Harold Barban Entered By: Lorine Bears on 08/10/2018 08:24:24 [name] ([id number]) -------------------------------------------------------------------------------- Patient/Caregiver Education Details Patient Name: [name] Date of Service: 08/10/2018 8:15 AM Medical Record Number: [id number] Patient Account Number: [id number] Date of Birth/Gender: 1980-09-10 (37 y.o. M) Treating RN: Harold Barban Primary Care Physician: [name] Other Clinician: Referring Physician: [name] Treating Physician/Extender: Sharalyn Ink in Treatment: 51 Education Assessment Education Provided To: Patient Education Topics Provided Wound/Skin Impairment: Handouts: Caring for Your Ulcer Methods: Demonstration, Explain/Verbal Responses: State content correctly Electronic Signature(s) Signed: 08/11/2018 9:04:30 AM By: Harold Barban Entered By: Harold Barban on 08/10/2018 08:50:44 Christian, [name] ([id number]) -------------------------------------------------------------------------------- Wound Assessment Details Patient Name: [name] Date of Service: 08/10/2018 8:15 AM Medical Record Number: [id number] Patient Account Number: [id number] Date of Birth/Sex: 08/02/1980 (37 y.o. M) Treating RN: Montey Hora Primary Care Alexis Mizuno: [name] Other Clinician: Referring Koa Zoeller: [name] Treating Saleha Kalp/Extender: Melburn Hake, [name]: 27 Wound Status Wound Number: 1 Primary Etiology: Pyoderma Wound Location: Left Lower Leg - Lateral Wound Status: Open Wounding Event: Gradually Appeared Comorbid History: Sleep Apnea, Hypertension, Colitis Date Acquired: 11/18/2015 Weeks Of Treatment: 87 Clustered Wound: No Photos Wound Measurements Length: (cm) 2.7 Width: (cm) 3.4 Depth: (cm) 0.2 Area: (cm) 7.21 Volume: (cm) 1.442 % Reduction in Area: -46.9% % Reduction in  Volume: 63.3% Epithelialization: Small (1-33%) Tunneling: No Undermining: No Wound Description Full Thickness With Exposed Support Foul Odor A Classification: Structures Slough/Fibr Wound Margin: Distinct, outline attached Exudate Medium Amount: Exudate Type: Purulent Exudate Color: yellow, brown, green fter Cleansing: No ino Yes Wound Bed Granulation Amount: None Present (0%) Exposed Structure Necrotic Amount: Large (67-100%) Fascia Exposed: No Necrotic Quality: Adherent Slough Fat Layer (Subcutaneous Tissue) Exposed: Yes Tendon Exposed: No Muscle Exposed: No Joint Exposed: No Bone Exposed: No Periwound Skin Texture Sproule, Fabian ([id number]) Texture Color No Abnormalities Noted: No No Abnormalities Noted: No Callus: No Atrophie Blanche: No Crepitus: No Cyanosis: No Excoriation: No Ecchymosis: Yes Induration: Yes Erythema: No Rash: No Hemosiderin Staining: Yes Scarring: Yes Mottled: No Pallor: No Moisture Rubor: No No Abnormalities Noted: No Dry / Scaly: Yes Temperature / Pain Maceration: No Temperature: No Abnormality Tenderness on Palpation: Yes Wound Preparation Ulcer Cleansing: Rinsed/Irrigated with Saline Topical Anesthetic Applied: Other: lidocaine 4%, Treatment Notes Wound #1 (Left, Lateral Lower Leg) Notes mupircin, TCA, BFD Electronic Signature(s) Signed: 08/10/2018 3:31:49 PM By: Montey Hora Entered By: Montey Hora on 08/10/2018 08:36:15 Sweezy, [name] ([id number]) -------------------------------------------------------------------------------- [place] Details Patient Name: [name] Date of Service: 08/10/2018 8:15 AM Medical Record Number: [id number] Patient Account Number: [id number] Date of Birth/Sex: 1981-04-26 (37 y.o. M) Treating RN: Harold Barban Primary Care Gennie Dib: [name] Other Clinician: Referring Evanthia Maund: [name] Treating Arielis Leonhart/Extender: Melburn Hake, [name]: 35 Vital Signs Time  Taken: 08:24 Temperature (F): 98.6 Height (in): 71 Pulse (bpm): 87 Weight (lbs): 338 Respiratory Rate (breaths/min): 16 Body Mass Index (BMI): 47.1 Blood Pressure (mmHg): 132/86 Reference Range: 80 - 120 mg / dl Electronic Signature(s) Signed: 08/10/2018 4:01:59 PM By: Lorine Bears RCP, RRT, CHT Entered By: Lorine Bears on 08/10/2018 08:27:38

## 2018-08-24 ENCOUNTER — Ambulatory Visit: Payer: 59 | Admitting: Family Medicine

## 2018-08-25 ENCOUNTER — Ambulatory Visit: Payer: 59 | Admitting: Physician Assistant

## 2018-09-04 ENCOUNTER — Other Ambulatory Visit: Payer: Self-pay | Admitting: Primary Care

## 2018-09-04 DIAGNOSIS — Z720 Tobacco use: Secondary | ICD-10-CM

## 2018-09-04 NOTE — Telephone Encounter (Signed)
Last prescribed on 06/17/2018. Last office visit on 06/17/2018 . No future appointment 

## 2018-09-06 NOTE — Telephone Encounter (Signed)
How's he doing since we continued Chantix? Is he still NOT smoking? Does he actually need a refill? The max amount of time on Chantix is 6 months, so this would be until June for him.

## 2018-09-07 ENCOUNTER — Ambulatory Visit (INDEPENDENT_AMBULATORY_CARE_PROVIDER_SITE_OTHER): Payer: 59 | Admitting: Family Medicine

## 2018-09-07 ENCOUNTER — Encounter: Payer: Self-pay | Admitting: Family Medicine

## 2018-09-07 ENCOUNTER — Ambulatory Visit (INDEPENDENT_AMBULATORY_CARE_PROVIDER_SITE_OTHER)
Admission: RE | Admit: 2018-09-07 | Discharge: 2018-09-07 | Disposition: A | Discharge status: Home / Self Care | Payer: 59 | Source: Ambulatory Visit | Attending: Family Medicine | Admitting: Family Medicine

## 2018-09-07 ENCOUNTER — Other Ambulatory Visit: Payer: Self-pay

## 2018-09-07 ENCOUNTER — Encounter: Payer: 59 | Attending: Physician Assistant | Admitting: Physician Assistant

## 2018-09-07 VITALS — BP 140/84 | HR 99 | Temp 98.5°F | Ht 72.0 in | Wt 352.5 lb

## 2018-09-07 DIAGNOSIS — G8929 Other chronic pain: Secondary | ICD-10-CM

## 2018-09-07 DIAGNOSIS — Z9889 Other specified postprocedural states: Secondary | ICD-10-CM

## 2018-09-07 DIAGNOSIS — L97222 Non-pressure chronic ulcer of left calf with fat layer exposed: Secondary | ICD-10-CM | POA: Diagnosis not present

## 2018-09-07 DIAGNOSIS — G5603 Carpal tunnel syndrome, bilateral upper limbs: Secondary | ICD-10-CM

## 2018-09-07 DIAGNOSIS — M25532 Pain in left wrist: Secondary | ICD-10-CM | POA: Diagnosis not present

## 2018-09-07 DIAGNOSIS — M25511 Pain in right shoulder: Secondary | ICD-10-CM | POA: Diagnosis not present

## 2018-09-07 DIAGNOSIS — G4730 Sleep apnea, unspecified: Secondary | ICD-10-CM | POA: Insufficient documentation

## 2018-09-07 DIAGNOSIS — I872 Venous insufficiency (chronic) (peripheral): Secondary | ICD-10-CM | POA: Diagnosis not present

## 2018-09-07 DIAGNOSIS — L88 Pyoderma gangrenosum: Secondary | ICD-10-CM | POA: Insufficient documentation

## 2018-09-07 NOTE — Progress Notes (Signed)
**Note [name]-Identified via Obfuscation** [name] T. [name] Haskew, MD Primary Care and Sports Medicine [facility] at [facility] [address] [place], [address] Phone: [phone]  FAX: [phone]  [name] - 38 y.o. male  MRN [id number]  Date of Birth: 08-Nov-1980  Visit Date: 09/07/2018  PCP: [name], NP  Referred by: [name], NP  Chief Complaint  Patient presents with  . Wrist Pain    Left-Shattered almost 4 years ago-now making crunching noise   Subjective:   [name] is a 39 y.o. very pleasant male patient who presents with the following:  L ORIF, approx 2016.  Clicking, crunching  He tells me at the time of injury about 4 years ago he had a combined distal radius and distal ulnar fracture that was treated operatively, and I cannot find an operative note.  He has been having some pain ongoing for several years with this, but he thinks that the the crunching and sounds have worsened recently.  Past Medical History, Surgical History, Social History, Family History, Problem List, Medications, and Allergies have been reviewed and updated if relevant.  Patient Active Problem List   Diagnosis Date Noted  . Cellulitis of right lower extremity 06/17/2018  . Shortness of breath 06/17/2018  . Tobacco abuse 06/17/2018  . Ulcerative colitis (HCC) 03/05/2018  . Venous stasis 02/24/2018  . Chronic pain of left knee 12/05/2017  . Chronic pain of left ankle 12/05/2017  . Type 2 diabetes mellitus (HCC) 07/15/2017  . Lower extremity edema 02/20/2017  . Seasonal allergic rhinitis 11/25/2016  . Pyoderma gangrenosum 11/25/2016  . Depression 04/17/2015  . Hyperlipidemia 04/17/2015    Past Medical History:  Diagnosis Date  . Blood in stool   . Depression   . Elevated blood pressure   . Hyperlipidemia   . OSA (obstructive sleep apnea) 2014  . Seasonal allergies   . Ulcerative colitis (HCC) 03/05/2018    Past Surgical History:  Procedure Laterality Date  .  SEPTOPLASTY  2007  . WRIST SURGERY      Social History   Socioeconomic History  . Marital status: Married    Spouse name: Not on file  . Number of children: Not on file  . Years of education: Not on file  . Highest education level: Not on file  Occupational History  . Not on file  Social Needs  . Financial resource strain: Not on file  . Food insecurity:    Worry: Not on file    Inability: Not on file  . Transportation needs:    Medical: Not on file    Non-medical: Not on file  Tobacco Use  . Smoking status: Former Games developer  . Smokeless tobacco: Former Neurosurgeon    Quit date: 03/17/2015  . Tobacco comment: Quit in [place] smoker for 9 years  Substance and Sexual Activity  . Alcohol use: Yes    Alcohol/week: 0.0 standard drinks    Comment: soical  . Drug use: Not on file  . Sexual activity: Not on file  Lifestyle  . Physical activity:    Days per week: Not on file    Minutes per session: Not on file  . Stress: Not on file  Relationships  . Social connections:    Talks on phone: Not on file    Gets together: Not on file    Attends religious service: Not on file    Active member of club or organization: Not on file    Attends meetings of  clubs or organizations: Not on file    Relationship status: Not on file  . Intimate partner violence:    Fear of current or ex partner: Not on file    Emotionally abused: Not on file    Physically abused: Not on file    Forced sexual activity: Not on file  Other Topics Concern  . Not on file  Social History Narrative   Married.   7 kids.   Works as a Magazine features editor at [employer].   Enjoys target shooting.     Family History  Problem Relation Age of Onset  . Alcohol abuse Paternal Aunt   . Alcohol abuse Paternal Uncle   . Stroke Paternal Uncle   . Hyperlipidemia Maternal Grandfather   . Hypertension Maternal Grandfather   . Diabetes Maternal Grandfather   . Alcohol abuse Maternal Grandfather   . Multiple sclerosis Mother    . Dementia Mother     Allergies  Allergen Reactions  . Biaxin [Clarithromycin] Other (See Comments)    Causes colitis flares  . Milk-Related Compounds Hives    Medication list reviewed and updated in full in [facility].  GEN: No fevers, chills. Nontoxic. Primarily MSK c/o today. MSK: Detailed in the HPI GI: tolerating PO intake without difficulty Neuro: No numbness, parasthesias, or tingling associated. Otherwise the pertinent positives of the ROS are noted above.   Objective:   Pulse 99   Temp 98.5 F (36.9 C) (Oral)   Ht 6' (1.829 m)   Wt (!) 352 lb 8 oz (159.9 kg)   BMI 47.81 kg/m    GEN: WDWN, NAD, Non-toxic, A & O x 3 HEENT: Atraumatic, Normocephalic. Neck supple. No masses, No LAD. Ears and Nose: No external deformity. EXTR: No c/c/e NEURO Normal gait.  PSYCH: Normally interactive. Conversant. Not depressed or anxious appearing.  Calm demeanor.    Compared to the right wrist, motion is preserved.  There is no tenderness along any of the bony anatomy including fingers, metacarpals, distal radius, ulna, scaphoid, and the remainder of the carpal bones.  Neurovascularly intact, and the patient has excellent grip strength.  Tinels and phalen's pos b  Radiology: Dg Wrist Complete Left  Result Date: 09/07/2018 CLINICAL DATA:  Pain. Status post open reduction internal fixation in 2016 EXAM: LEFT WRIST - COMPLETE 3+ VIEW COMPARISON:  None. FINDINGS: Frontal, oblique, and lateral views obtained. There is postoperative screw and plate fixation in the distal radius. Several screws extend into the radiocarpal joint region. There does not appear to be loosening of the fixation hardware. There is a small exostosis along the medial distal radial metaphysis. No fracture or dislocation. No appreciable joint space narrowing. No erosive change. There is slight calcification in the triangular fibrocartilage region. IMPRESSION: Postoperative change with screw and plate fixation of  the distal radius. Several screws extend into the radiocarpal joint. Small exostosis along the lateral distal radius. No acute fracture or dislocation. No appreciable joint space narrowing. Mild calcification in triangular fibrocartilage may represent previous tear in this area. Electronically Signed   By: Bretta Bang III M.D.   On: 09/07/2018 09:13     Assessment and Plan:   Chronic wrist pain, left - Plan: DG Wrist Complete Left  H/O left wrist surgery - Plan: DG Wrist Complete Left  Carpal tunnel syndrome, bilateral  >25 minutes spent in face to face time with patient, >50% spent in counselling or coordination of care   Extensive prior fracture with volar plate on the radius.  No screw migration, but there is some intra-articular involvement from prior screw.  Bilateral carpal tunnel syndrome, worsened in the last month.  Logically, this would be with increased computer use with the COVID-19 outbreak.   Tylenol as needed. Trial of carpal tunnel brace, begin on the left, only at nighttime.  Progressed to bilateral if it helps on the left.  Follow-up: prn  No orders of the defined types were placed in this encounter.  Orders Placed This Encounter  Procedures  . DG Wrist Complete Left    Signed,  Farryn Linares T. Quincie Haroon, MD   Outpatient Encounter Medications as of 09/07/2018  Medication Sig  . acetaminophen (TYLENOL) 500 MG tablet Take 1,000 mg by mouth every 6 (six) hours as needed for headache (pain).  . cetirizine (ZYRTEC) 10 MG tablet Take 1 tablet (10 mg total) by mouth daily.  . clobetasol ointment (TEMOVATE) 0.05 % Apply 1 application topically 2 (two) times daily.   Marland Kitchen. doxycycline (VIBRA-TABS) 100 MG tablet Take 100 mg by mouth 2 (two) times daily.   . fluticasone (FLONASE) 50 MCG/ACT nasal spray Place 1 spray into both nostrils 2 (two) times daily.  . hydrocortisone 2.5 % cream Apply 1 application topically 2 (two) times daily.  Marland Kitchen. ibuprofen (ADVIL,MOTRIN) 200 MG  tablet Take 400-600 mg by mouth every 6 (six) hours as needed for headache (pain).  . metFORMIN (GLUCOPHAGE) 1000 MG tablet Take 1 tablet (1,000 mg total) by mouth 2 (two) times daily with a meal.  . mupirocin ointment (BACTROBAN) 2 % Apply topically two (2) 60g times a day. Mix with clobetasol and apply twice daily  . varenicline (CHANTIX CONTINUING MONTH PAK) 1 MG tablet Take 1 tablet (1 mg total) by mouth 2 (two) times daily.  Marland Kitchen. venlafaxine XR (EFFEXOR-XR) 150 MG 24 hr capsule TAKE 1 CAPSULE BY MOUTH  DAILY WITH BREAKFAST  . [DISCONTINUED] dapsone 100 MG tablet Take 200 mg by mouth daily.   . [DISCONTINUED] doxepin (SINEQUAN) 50 MG capsule Take 50 mg by mouth at bedtime.  . [DISCONTINUED] gabapentin (NEURONTIN) 300 MG capsule Take 300 mg by mouth 2 (two) times daily.  . [DISCONTINUED] ranitidine (ZANTAC) 150 MG tablet Take 150 mg by mouth 2 (two) times daily as needed for heartburn (acid reflux).    No facility-administered encounter medications on file as of 09/07/2018.

## 2018-09-08 MED ORDER — VARENICLINE TARTRATE 0.5 MG X 11 & 1 MG X 42 PO MISC: ORAL | 0 refills | Status: DC

## 2018-09-08 NOTE — Telephone Encounter (Signed)
Message left for patient to return my call. Also sent a message through MyChart. 

## 2018-09-08 NOTE — Telephone Encounter (Signed)
Noted, reviewed my chart message

## 2018-09-09 ENCOUNTER — Other Ambulatory Visit: Payer: Self-pay

## 2018-09-09 DIAGNOSIS — F3342 Major depressive disorder, recurrent, in full remission: Secondary | ICD-10-CM

## 2018-09-10 MED ORDER — VENLAFAXINE HCL ER 150 MG PO CP24: ORAL_CAPSULE | ORAL | 0 refills | Status: DC

## 2018-09-21 ENCOUNTER — Encounter: Payer: 59 | Attending: Physician Assistant | Admitting: Physician Assistant

## 2018-09-21 ENCOUNTER — Other Ambulatory Visit: Payer: Self-pay

## 2018-09-21 DIAGNOSIS — I10 Essential (primary) hypertension: Secondary | ICD-10-CM | POA: Diagnosis not present

## 2018-09-21 DIAGNOSIS — I872 Venous insufficiency (chronic) (peripheral): Secondary | ICD-10-CM | POA: Insufficient documentation

## 2018-09-21 DIAGNOSIS — L97222 Non-pressure chronic ulcer of left calf with fat layer exposed: Secondary | ICD-10-CM | POA: Diagnosis not present

## 2018-09-21 DIAGNOSIS — B9561 Methicillin susceptible Staphylococcus aureus infection as the cause of diseases classified elsewhere: Secondary | ICD-10-CM | POA: Diagnosis not present

## 2018-09-21 DIAGNOSIS — L88 Pyoderma gangrenosum: Secondary | ICD-10-CM | POA: Diagnosis not present

## 2018-09-24 DIAGNOSIS — E785 Hyperlipidemia, unspecified: Principal | ICD-10-CM

## 2018-09-24 DIAGNOSIS — L88 Pyoderma gangrenosum: Secondary | ICD-10-CM

## 2018-09-24 DIAGNOSIS — Z8719 Personal history of other diseases of the digestive system: Secondary | ICD-10-CM

## 2018-09-24 DIAGNOSIS — G4730 Sleep apnea, unspecified: Secondary | ICD-10-CM | POA: Diagnosis not present

## 2018-09-24 DIAGNOSIS — L97222 Non-pressure chronic ulcer of left calf with fat layer exposed: Secondary | ICD-10-CM | POA: Diagnosis not present

## 2018-09-24 NOTE — Unmapped (Signed)
RHEUMATOLOGY INITIAL REMOTE CONSULTATION NOTE  Patient self identified using name and date of birth.    Patient location: Home     PCP: ??Andrew Cameron, AGNP  Referring Provider: [name]    Andrew Cameron is seen in consultation at the request of Andrew Fossa Pearls* for joint pain    History of Present Illness:     HPI: Andrew Cameron is a 38 y.o. male with a history of inflammatory bowel disease and pyogenic granuloma who is being seen in consultation at the request of Andrew Fossa Pearls* for evaluation of joint pain.  L knee started hurting 7-8 years ago.  Mild aching in lower back.  Pain in his ankles, elbows, and wrists.  (Notes prior ORIF L wrist after fall.)  Joint pain worse with colder weather.  Also worse in evening after on feet all day.  AM stiffness for 1 hour.  Also stiff after sitting for 15-30 mins.   He reports swelling in legs (bad valves in legs), also has swelling in his knees when walking for a bit of time, improves overnight.  Dr. Carles Collet had recommended humira, awaiting insurance approval.  Was recently treated with prednisone for leg ulcer which he reports helped joint pain.  Also with staph infection in R leg, following with dermatology on doxycycline. Will be seeing GI at [facility] in a couple weeks.  Previously on asacol for colitis, not had flare in 10 years.  No history of dactylitis. No psoriasis or nail changes. No family history of psoriasis. No history of red, light-sensitive, painful eye.     Past Medical History: Reviewed history in Epic with the patient and includes the following:  ??  Past Medical History:   Diagnosis Date   ??? Diabetes mellitus (CMS-HCC)        Past Surgical History: Reviewed history in Epic with the patient and includes the following:  Past Surgical History:   Procedure Laterality Date   ??? SKIN BIOPSY     Rhinoplasty sinus surgery  Wrist surgery    Social History: ??  ?  Social History     Tobacco Use   ??? Smoking status: Former Smoker Last attempt to quit: 05/28/2018     Years since quitting: 0.3   ??? Smokeless tobacco: Never Used   Substance Use Topics   ??? Alcohol use: Yes   ??? Drug use: Not on file   ??  ??Occupation: Environmental manager  Tobacco: Quit cigarettes Feb 2020  Alcohol: Rarely    ??Allergies:   ??  Allergies   Allergen Reactions   ??? Lactalbumin Hives   ??? Clarithromycin Other (See Comments)     Causes colitis flares     Family History: Reviewed history in Epic with the patient and includes the following  ???  No family history of autoimmune disease including no SLE, Sjogren's, rheumatoid arthritis, psoriasis, psoriatic arthritis, IBD, uveitis, multiple miscarriages, recurrent blood clots/PE's, or other autoimmune disorders.    Current Medications:  ??  Current Outpatient Medications:   ???  acetaminophen (TYLENOL) 500 MG tablet, Take 1,000 mg by mouth., Disp: , Rfl:   ???  adalimumab (HUMIRA) 40 mg/0.8 mL subcutaneous pen kit, Inject 2 pens (80 mg) under the skin for the first dose then inject 1 pen (40 mg) every 2 weeks (Patient not taking: Reported on 07/08/2018), Disp: 6 each, Rfl: 6  ???  cetirizine (ZYRTEC) 10 MG tablet, Take 10 mg by mouth., Disp: , Rfl:   ???  CHANTIX STARTING MONTH BOX 0.5 mg (11)- 1 mg (42) tablet, TAKE 0.5 MG TABLET BY MOUTH ONCE DAILY FOR 3 DAYS THEN INCREASE TO 0.5 MG TWICE DAILY DAILY FOR 4 DAYS THEN INCREASE TO 1 MG TWICE DAILY THE, Disp: , Rfl:   ???  dapsone 7.5 % GlwP, Apply 1 application topically two (2) times a day., Disp: 1 Bottle, Rfl: 0  ???  doxycycline (VIBRA-TABS) 100 MG tablet/capsule, Take 1 each (100 mg total) by mouth Two (2) times a day., Disp: 60 each, Rfl: 2  ???  fluticasone (FLONASE) 50 mcg/actuation nasal spray, , Disp: , Rfl:   ???  hydrocortisone 2.5 % cream, Apply 1 application topically Two (2) times a day. To face for itch, Disp: 453.6 g, Rfl: 1  ???  ibuprofen (ADVIL,MOTRIN) 200 MG tablet, Take 400-600 mg by mouth., Disp: , Rfl:   ???  metFORMIN (GLUCOPHAGE) 1000 MG tablet, Take 1,000 mg by mouth two (2) times a day., Disp: , Rfl:   ???  metFORMIN (GLUCOPHAGE) 500 MG tablet, Take 500 mg by mouth., Disp: , Rfl:   ???  mupirocin (BACTROBAN) 2 % ointment, Apply topically Three (3) times a day. To affected area till healed, Disp: 15 g, Rfl: 1  ???  mupirocin (BACTROBAN) 2 % ointment, Apply topically Two (2) times a day. Mix with clobetasol and apply twice daily, Disp: 60 g, Rfl: 2  ???  ranitidine (ZANTAC) 150 MG tablet, Take 150 mg by mouth., Disp: , Rfl:   ???  SANTYL ointment, , Disp: , Rfl:   ???  venlafaxine (EFFEXOR-XR) 150 MG 24 hr capsule, Take 150 mg by mouth., Disp: , Rfl:     Review of Systems:   All other systems reviewed are negative except as per HPI.     Review of outside records: I have reviewed the outside records in [facility] and that were sent to me by the referring provider and these were scanned into the medical record. I have also reviewed the pertinent records including notes, labs, imaging tests in the medical record.     Objective    Physical Exam:  Patient was unable to access video for appointment despite multiple tries.     GENERAL: The patient sounds to be in no acute distress.   Respiratory: No cough.   PSYCH: No depression or anxiety. Cooperative. Alert and oriented.     Test Results:  12/06/2017 XR L knee:  Impression: Minimal patellofemoral degenerative change.    Assessment/Plan:     Andrew Cameron is a 38 y.o. male with a history of inflammatory bowel disease and pyogenic granuloma who is being seen for evaluation of joint pain.  He is planning to start Humira for pyogenic granuloma per dermatology. He has ome elements to point to inflammatory arthritis including 1 hour of AM stiffness, however also has pain that is worse in the evening.    1. Discussed with pt that if he starts Humira for pyogenic granuloma, if he has inflammatory arthritis due to IBD-associated arthritis or Rheumatoid Arthritis (RA), would likely treat arthritis as well.  However will not treat osteoarthritis (OA).  2. Will see pt back in a few months ideally in person to reevaluate for inflammatory arthritis and consider checking labs (RF and CCP) and/or XRs of bilat hands and feet.     Follow-up: 3-4 months with Carlus Pavlov PA-c, 8-9 months with me.     We discussed the above including diagnosis and recommendations, agreed on the above plan, and all  questions were answered.    Return for Follow-up PG and joint pain.    Danella Maiers, MD, MSCI  Assistant Professor of Medicine  Department of Medicine/Division of Rheumatology  [facility] [place]  [phone] clinic phone  [phone] clinic secure fax      CC:   Andrew Cameron, AGNP  Andrew Fossa Pearls*    Diagnoses and all orders for this visit:    Hyperlipidemia, unspecified hyperlipidemia type    History of ulcerative colitis    Pyoderma gangrenosum         I spent 28 minutes on the phone with the patient.  Attempted planned video appointment but pt unable to get video to work. I spent an additional 15 minutes on pre- and post-visit activities.     The patient was physically located in [place] or a state in which I am permitted to provide care. The patient and/or parent/gauardian understood that s/he may incur co-pays and cost sharing, and agreed to the telemedicine visit. The visit was completed via phone and/or video, which was appropriate and reasonable under the circumstances given the patient's presentation at the time.    The patient and/or parent/guardian has been advised of the potential risks and limitations of this mode of treatment (including, but not limited to, the absence of in-person examination) and has agreed to be treated using telemedicine. The patient's/patient's family's questions regarding telemedicine have been answered.     If the phone/video visit was completed in an ambulatory setting, the patient and/or parent/guardian has also been advised to contact their provider???s office for worsening conditions, and seek emergency medical treatment and/or call 911 if the patient deems either necessary.

## 2018-09-24 NOTE — Progress Notes (Signed)
KYL, GIVLER ([id number]) Visit Report for 09/21/2018 Chief Complaint Document Details Patient Name: [name], [name] Date of Service: 09/21/2018 8:15 AM Medical Record Number: [id number] Patient Account Number: [id number] Date of Birth/Sex: 1981-03-01 (38 y.o. M) Treating RN: Harold Barban Primary Care Provider: [name] Other Clinician: Referring Provider: [name] Treating Provider/Extender: Melburn Hake, Brittannie Tawney Weeks in Treatment: 18 Information Obtained from: Patient Chief Complaint He is here in follow up evaluation for LLE pyoderma ulcer Electronic Signature(s) Signed: 09/21/2018 4:49:37 PM By: Worthy Keeler PA-C Entered By: Worthy Keeler on 09/21/2018 08:16:54 COLLIN, [name] ([id number]) -------------------------------------------------------------------------------- HPI Details Patient Name: [name] Date of Service: 09/21/2018 8:15 AM Medical Record Number: [id number] Patient Account Number: [id number] Date of Birth/Sex: 1981-01-22 (37 y.o. M) Treating RN: Harold Barban Primary Care Provider: [name] Other Clinician: Referring Provider: [name] Treating Provider/Extender: Melburn Hake, Kip Cropp Weeks in Treatment: 8 History of Present Illness HPI Description: 12/04/16; 38 year old man who comes into the clinic today for review of a wound on the posterior left calf. He tells me that is been there for about a year. He is not a diabetic he does smoke half a pack per day. He was seen in the ER on 11/20/16 felt to have cellulitis around the wound and was given clindamycin. An x-ray did not show osteomyelitis. The patient initially tells me that he has a milk allergy that sets off a pruritic itching rash on his lower legs which [name] scratches incessantly and he thinks that's what may have set up the wound. He has been using various topical antibiotics and ointments without any effect. He works in a [employer] and is on his feet all day. He does not have a prior  history of wounds however he does have the rash on both lower legs the right arm and the ventral aspect of his left arm. These are excoriations and clearly have had scratching however there are of macular looking areas on both legs including a substantial larger area on the right leg. This does not have an underlying open area. There is no blistering. The patient tells me that 2 years ago in [place] in response to the rash on his legs he saw a dermatologist who told him he had a condition which may be pyoderma gangrenosum although I may be putting words into his mouth. He seemed to recognize this. 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 He is not in any treatment for this. He's had no recent travel 12/11/16; the patient arrives today with his wound and roughly the same condition we've been using silver alginate this is a deep punched out wound with some surrounding erythema but no tenderness. Biopsy I did did not show confirmed pyoderma gangrenosum suggested nonspecific inflammation and vasculitis but does not provide an actual description of what was seen by the pathologist. I'm really not able to understand this We have also received information from the patient's dermatologist in [place] notes from April 2016. This was a doctor Agarwal- antal. 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 He was prescribed topical steroid high potency under occlusion which helped but at this point the patient did not have a deep punched out wound. 12/18/16; the patient's wound is larger in terms of surface area however this surface looks better and there is less depth. The surrounding erythema also is better. The patient states that the wrap we put on came off 2 days ago when he has been using  his compression stockings. He we are in the process of getting a dermatology consult. 12/26/16 on evaluation today patient's left lower extremity wound shows evidence of  infection with surrounding erythema noted. He has been tolerating the dressing changes but states that he has noted more discomfort. There is a larger area of erythema surrounding the wound. No fevers, chills, nausea, or vomiting noted at this time. With that being said the wound still does have slough covering the surface. He is not allergic to any medication that he is aware of at this point. In regard to his right lower extremity he had several regions that are erythematous and pruritic he wonders if there's anything we can do to help that. 01/02/17 I reviewed patient's wound culture which was obtained his visit last week. He was placed on doxycycline at that point. Unfortunately that does not appear to be an antibiotic that would likely help with the situation however the pseudomonas noted on culture is sensitive to Cipro. Also unfortunately patient's wound seems to have a large compared to last week's evaluation. Not severely so but there are definitely increased measurements in general. He is continuing to have discomfort as well he writes this to be a seven out of 10. In fact he would prefer me not to perform any debridement today due to the fact that he is having discomfort and considering he has an active infection on the little reluctant to do so anyway. No fevers, chills, nausea, or vomiting noted at this time. 01/08/17; patient seems dermatology on September 5. I suspect dermatology will want the slides from the biopsy I did sent to their pathologist. I'm not sure if there is a way we can expedite that. In any case the culture I did before I left on vacation 3 weeks ago showed Pseudomonas he was given 10 days of Cipro and per her description of her intake nurses is actually somewhat better this week although the wound is quite a bit bigger than I remember the last time I saw this. He still has 3 more days of Cipro [name], [name] ([id number]) 01/21/17; dermatology appointment tomorrow. He has  completed the ciprofloxacin for Pseudomonas. Surface of the wound looks better however he is had some deterioration in the lesions on his right leg. Meantime the left lateral leg wound we will continue with sample 01/29/17; patient had his dermatology appointment but I can't yet see that note. He is completed his antibiotics. The wound is more superficial but considerably larger in circumferential area than when he came in. This is in his left lateral calf. He also has swollen erythematous areas with superficial wounds on the right leg and small papular areas on both arms. There apparently areas in her his upper thighs and buttocks I did not look at those. Dermatology biopsied the right leg. Hopefully will have their input next week. 02/05/17; patient went back to see his dermatologist who told him that he had a "scratching problem" as well as staph. He is now on a 30 day course of doxycycline and I believe [name] gave him triamcinolone cream to the right leg areas to help with the itching [not exactly sure but probably triamcinolone]. [name] apparently looked at the left lateral leg wound although this was not rebiopsied and I think felt to be ultimately part of the same pathogenesis. He is using sample border foam and changing nevus himself. He now has a new open area on the right posterior leg which was his biopsy site I don't  have any of the dermatology notes 02/12/17; we put the patient in compression last week with SANTYL to the wound on the left leg and the biopsy. Edema is much better and the depth of the wound is now at level of skin. Area is still the same oBiopsy site on the right lateral leg we've also been using santyl with a border foam dressing and he is changing this himself. 02/19/17; Using silver alginate started last week to both the substantial left leg wound and the biopsy site on the right wound. He is tolerating compression well. Has a an appointment with his primary M.D. tomorrow  wondering about diuretics although I'm wondering if the edema problem is actually lymphedema 02/26/17; the patient has been to see his primary doctor Dr. Jerrel Ivory at [facility] our primary care. [name] started him on Lasix 20 mg and this seems to have helped with the edema. However we are not making substantial change with the left lateral calf wound and inflammation. The biopsy site on the right leg also looks stable but not really all that different. 03/12/17; the patient has been to see vein and vascular Dr. Lucky Cowboy. He has had venous reflux studies I have not reviewed these. I did get a call from his dermatology office. 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 They therefore recommended clobetasol and tetracycline i.e. 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 03/26/17; apparently the patient just had reflux studies not an appointment with Dr. dew. [name] arrives in clinic today having applied clobetasol for 2-3 weeks. He notes over the last 2-3 days excessive drainage having to change the dressing 3-4 times a day and also expanding erythema. 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 Lillia Carmel at [facility] dermatology locally who kindly saw him at my request. 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 My concern was the degree of erythema medially extending from his left leg wound which was either inflammation from  pyoderma or cellulitis. I put him on Augmentin however culture of the wound showed Pseudomonas which is quinolone sensitive. I really don't believe he has cellulitis however in view of everything I will continue and give him a course of Cipro. He is also on doxycycline as an immune modulator for the pyoderma. In addition to his original wound on the left lateral leg with surrounding erythema he has a wound on the right posterior calf which was an original biopsy site done by dermatology. 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 Saw Dr. Lillia Carmel yesterday. He has been using topical antibiotics to both wound areas his original wound on the left and the biopsies/pathergy area on the right. There is definitely some improvement in the inflammation around the wound on the right although the patient states he has increasing sensitivity of the wounds. He is on prednisone 60 and doxycycline 1 as prescribed by Dr. Lillia Carmel. He is covering the topical  antibiotic with gauze and putting this in his own compression stocks and changing this daily. He states that Dr. Lottie Rater did a culture of the left leg wound yesterday 05/07/17; pyoderma gangrenosum. The patient saw Dr. Lillia Carmel yesterday and has a follow-up with her in one month. He is still using topical antibiotics to both wounds although he can't recall exactly what type. He is still on prednisone 60 mg. Dr. Lillia Carmel stated that the doxycycline could stop if we were in agreement. 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 The right medial leg was induced by biopsy/pathergy. The area on the right is essentially healed. 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 The right medial leg has closed and  remains closed. He is still on prednisone 60. oHe tells me he missed his last dermatology appointment with Dr. Lillia Carmel but will make another appointment. He reports that her blood sugar at a recent screen in [place] was high 200's. He was 180 today. He is more cushingoid blood pressure is [name], [name] ([id number]) up a bit. I think he is going to require still much longer prednisone perhaps another 3 months before attempting to taper. In the meantime his wound is a lot better. Smaller. He is cleaning this off daily and applying topical antibiotics. When he was last in the clinic I thought about changing to [facility] and actually put in a couple of calls to dermatology although probably not during their business hours. 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 He continues on oral prednisone. He has been using triple antibiotic ointment. There is surface debris and we will transition to [facility] and have him return in 2 weeks. He has lost 30 pounds since his last appointment with lifestyle modification. He may benefit from topical steroid cream for treatment this can be considered at a later date. 08/22/17 on evaluation today patient appears to actually be doing rather well in regard to his left lateral lower extremity ulcer. He has actually been managed by Dr. Dellia Nims most recently. Patient is currently on oral steroids at this time. This seems to have been of benefit for him. Nonetheless his last visit was actually with Leah on 08/06/17. Currently he is not utilizing any topical steroid creams although this could be of benefit as well. No fevers, chills, nausea, or vomiting noted at this time. 09/05/17 on evaluation today patient appears to be doing better in regard to his left lateral lower extremity ulcer. He has been tolerating the dressing changes without complication.  He is using Santyl with good effect. Overall I'm very pleased with how things are standing at this point. Patient likewise is happy that this is doing better. 09/19/17 on evaluation today patient actually appears to be doing rather well in regard to his left lateral lower extremity ulcer. 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 He's not having any significant pain. 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 He has been tolerating the Santyl without complication and in general I feel like he's making good  progress. 10/17/17 on evaluation today patient appears to be doing very well in regard to his left lateral lower surety ulcer. He has been tolerating the dressing changes without complication. There does not appear to be any evidence of infection he's alternating the Santyl and the triple antibiotic ointment every other day this seems to be doing well for him. 11/03/17 on evaluation today patient appears to be doing very well in regard to his left lateral lower extremity ulcer. He is been tolerating the dressing changes without complication which is good news. Fortunately there does not appear to be any evidence of infection which is also great news. Overall is doing excellent they are starting to taper down on the prednisone is down to 40 mg at this point it also started topical clobetasol for him. 11/17/17 on evaluation today patient appears to be doing well in regard to his left lateral lower surety ulcer. He's been tolerating the dressing changes without complication. He does note that he is having no pain, no excessive drainage or discharge, and overall he feels like things are going about how he would expect and hope they would. Overall he seems to have no evidence of infection at this time in my opinion which is  good news. 12/04/17-He is seen in follow-up evaluation for right lateral lower extremity ulcer. He has been applying topical steroid cream. Today's measurement show slight increase in size. Over the next 2 weeks we will transition to every other day Santyl and steroid cream. He has been encouraged to monitor for changes and notify clinic with any concerns 12/15/17 on evaluation today patient's left lateral motion the ulcer and fortunately is doing worse again at this point. This just since last week to this week has close to doubled in size according to the patient. I did not seeing last week's I do not have a visual to compare this to in our system was also down so we do not have all the charts and at this point. Nonetheless it does have me somewhat concerned in regard to the fact that again he was worried enough about it he has contact the dermatology that placed them back on the full strength, 50 mg a day of the prednisone that he was taken previous. He continues to alternate using clobetasol along with Santyl at this point. He is obviously somewhat frustrated. 12/22/17 on evaluation today patient appears to be doing a little worse compared to last evaluation. Unfortunately the wound is a little deeper and slightly larger than the last week's evaluation. With that being said he has made some progress in regard to the irritation surrounding at this time unfortunately despite that progress that's been made he still has a significant issue going on here. 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 For that reason I discussed with him today the possibility of perform a wound culture to ensure there's nothing overtly infected. 01/06/18 on evaluation today patient's wound is larger and deeper than previously evaluated. With that being said it did appear that  his wound was infected after my last evaluation with him. Subsequently I did end up prescribing a prescription for Bactrim DS which [name] has been taking and having no complication with. Fortunately there does not appear to be any evidence of [name], [name] ([id number]) infection at this point in time as far as anything spreading, no want to  touch, and overall I feel like things are showing signs of improvement. 01/13/18 on evaluation today patient appears to be even a little larger and deeper than last time. There still muscle exposed in the base of the wound. Nonetheless he does appear to be less erythematous I do believe inflammation is calming down also believe the infection looks like it's probably resolved at this time based on what I'm seeing. No fevers, chills, nausea, or vomiting noted at this time. 01/30/18 on evaluation today patient actually appears to visually look better for the most part. Unfortunately those visually this looks better he does seem to potentially have what may be an abscess in the muscle that has been noted in the central portion of the wound. This is the first time that I have noted what appears to be fluctuance in the central portion of the muscle. With that being said I'm somewhat more concerned about the fact that this might indicate an abscess formation at this location. I do believe that an ultrasound would be appropriate. This is likely something we need to try to do as soon as possible. He has been switch to mupirocin ointment and he is no longer using the steroid ointment as prescribed by dermatology he sees them again next week he's been decreased from 60 to 40 mg of prednisone. 03/09/18 on evaluation today patient actually appears to be doing a little better compared to last time I saw him. There's not as much erythema surrounding the wound itself. He I did review his most recent infectious disease note which was dated 02/24/18. He saw Dr. Michel Bickers in  [place]. 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 Patient is now off of antibiotics and they are continually observing this. There seems to be no change in the past two weeks in my pinion based on what the patient says and what I see today compared to what Dr. Megan Salon likely saw two weeks ago. No fevers, chills, nausea, or vomiting noted at this time. 03/23/18 on evaluation today patient's wound actually appears to be showing signs of improvement which is good news. He is currently still on the Dapsone. He is also working on tapering the prednisone to get off of this and Dr. Lottie Rater is working with him in this regard. Nonetheless overall I feel like the wound is doing well it does appear based on the infectious disease note that I reviewed from Dr. 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 I did also review the results of his ultrasound of left lower extremity which revealed there was a dentist tissue in the base of the wound without an abscess noted. 04/06/18 on evaluation today the patient's left lateral lower extremity ulcer actually appears to be doing fairly well which is excellent news. There does not appear to be any evidence of infection at this time which is also great news. Overall he still does have a significantly large ulceration although little by little he seems to be making progress. He is down to 10 mg a day of the prednisone. 04/20/18 on evaluation today patient actually appears to be doing excellent at this time in regard to his left lower extremity ulcer. He's making signs of good progress unfortunately this is taking much longer than we would really like  to see but nonetheless he is making progress. Fortunately there does not appear to be any evidence of infection at this  time. No fevers, chills, nausea, or vomiting noted at this time. The patient has not been using the Santyl due to the cost he hadn't got in this field yet. He's mainly been using the antibiotic ointment topically. Subsequently he also tells me that he really has not been scrubbing in the shower I think this would be helpful again as I told him it doesn't have to be anything too aggressive to even make it believe just enough to keep it free of some of the loose slough and biofilm on the wound surface. 05/11/18 on evaluation today patient's wound appears to be making slow but sure progress in regard to the left lateral lower extremity ulcer. He is been tolerating the dressing changes without complication. Fortunately there does not appear to be any evidence of infection at this time. He is still just using triple antibiotic ointment along with clobetasol occasionally over the area. He never got the Santyl and really does not seem to intend to in my pinion. 06/01/18 on evaluation today patient appears to be doing a little better in regard to his left lateral lower extremity ulcer. He states that overall he does not feel like he is doing as well with the Dapsone as he did with the prednisone. Nonetheless he sees his dermatologist later today and is gonna talk to them about the possibility of going back on the prednisone. Overall again I believe that the wound would be better if you would utilize Santyl but he really does not seem to be interested in going back to the [place] at this point. He has been using triple antibiotic ointment. 06/15/18 on evaluation today patient's wound actually appears to be doing about the same at this point. Fortunately there is no signs of infection at this time. He has made slight improvements although he continues to not really want to clean the wound bed at this point. He states that he just doesn't mess with it he doesn't want to cause any problems with everything else  he has going on. He has been on medication, antibiotics as prescribed by his dermatologist, for a staff infection of his lower extremities which is really drying out now and looking much better he tells me. Fortunately there is no sign of overall infection. CHISTIAN, KASLER ([id number]) 06/29/18 on evaluation today patient appears to be doing well in regard to his left lateral lower surety ulcer all things considering. Fortunately his staff infection seems to be greatly improved compared to previous. He has no signs of infection and this is drying up quite nicely. He is still the doxycycline for this is no longer on cental, Dapsone, or any of the other medications. His dermatologist has recommended possibility of an infusion but right now he does not want to proceed with that. 07/13/18 on evaluation today patient appears to be doing about the same in regard to his left lateral lower surety ulcer. Fortunately there's no signs of infection at this time which is great news. Unfortunately he still builds up a significant amount of Slough/biofilm of the surface of the wound he still is not really cleaning this as he should be appropriately. Again I'm able to easily with saline and gauze remove the majority of this on the surface which if you would do this at home would likely be a dramatic improvement for him as far as getting the area  to improve. Nonetheless overall I still feel like he is making progress is just very slow. I think Santyl will be of benefit for him as well. Still he has not gotten this as of this point. 07/27/18 on evaluation today patient actually appears to be doing little worse in regards of the erythema around the periwound region of the wound he also tells me that he's been having more drainage currently compared to what he was experiencing last time I saw him. He states not quite as bad as what he had because this was infected previously but nonetheless is still appears to be doing  poorly. Fortunately there is no evidence of systemic infection at this point. The patient tells me that he is not going to be able to afford the Santyl. He is still waiting to hear about the infusion therapy with his dermatologist. Apparently [name] wants an updated colonoscopy first. 08/10/18 on evaluation today patient appears to be doing better in regard to his left lateral lower extremity ulcer. Fortunately he is showing signs of improvement in this regard he's actually been approved for Remicade infusion's as well although this has not been scheduled as of yet. Fortunately there's no signs of active infection at this time in regard to the wound although he is having some issues with infection of the right lower extremity is been seen as dermatologist for this. Fortunately they are definitely still working with him trying to keep things under control. 09/07/18 on evaluation today patient is actually doing rather well in regard to his left lateral lower extremity ulcer. He notes these actually having some hair grow back on his extremity which is something he has not seen in years. He also tells me that the pain is really not giving them any trouble at this time which is also good news overall [name] is very pleased with the progress he's using a combination of the mupirocin along with the probate is all mixed. 09/21/18 on evaluation today patient actually appears to be doing fairly well all things considered in regard to his looks from the ulcer. He's been tolerating the dressing changes without complication. Fortunately there's no signs of active infection at this time which is good news he is still on all antibiotics or prevention of the staff infection. He has been on prednisone for time although he states it is gonna contact his dermatologist and see if [name] put them on a short course due to some irritation that he has going on currently. Fortunately there's no evidence of any overall worsening this is  going very slow I think cental would be something that would be helpful for him although he states that $50 for tube is quite expensive. He therefore is not willing to get that at this point. Electronic Signature(s) Signed: 09/21/2018 4:49:37 PM By: Worthy Keeler PA-C Entered By: Worthy Keeler on 09/21/2018 08:46:37 DALTYN, DEGROAT ([id number]) -------------------------------------------------------------------------------- Physical Exam Details Patient Name: [name] Date of Service: 09/21/2018 8:15 AM Medical Record Number: [id number] Patient Account Number: [id number] Date of Birth/Sex: 07-06-1980 (37 y.o. M) Treating RN: Harold Barban Primary Care Provider: [name] Other Clinician: Referring Provider: [name] Treating Provider/Extender: Melburn Hake, Jkai Arwood Weeks in Treatment: 20 Constitutional Well-nourished and well-hydrated in no acute distress. Respiratory normal breathing without difficulty. clear to auscultation bilaterally. Cardiovascular regular rate and rhythm with normal S1, S2. Psychiatric this patient is able to make decisions and demonstrates good insight into disease process. Alert and Oriented x 3. pleasant and cooperative. Notes  Patient's wound bed currently did have some tightly inherent Slough noted on the surface of the wound unfortunately. Fortunately there is no evidence of infection and this is measuring smaller he is slowly but surely making progress which is good news. Electronic Signature(s) Signed: 09/21/2018 4:49:37 PM By: Worthy Keeler PA-C Entered By: Worthy Keeler on 09/21/2018 08:47:10 [name], [name] ([id number]) -------------------------------------------------------------------------------- Physician Orders Details Patient Name: [name] Date of Service: 09/21/2018 8:15 AM Medical Record Number: [id number] Patient Account Number: [id number] Date of Birth/Sex: 1981-02-23 (37 y.o. 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 Anesthetic (add to Medication List) Wound #1 Left,Lateral Lower Leg o Topical Lidocaine 4% cream applied to wound bed prior to debridement (In Clinic Only). Primary Wound Dressing Wound #1 Left,Lateral Lower Leg o Santyl Ointment - In clinic o Other: - Mupirocin mix half and half with clobetasol Secondary Dressing Wound #1 Left,Lateral Lower Leg o Conform/Kerlix o Non-adherent pad Dressing Change Frequency Wound #1 Left,Lateral Lower Leg o Change dressing every day. Follow-up Appointments Wound #1 Left,Lateral Lower Leg o Return Appointment in 2 weeks. Edema Control Wound #1 Left,Lateral Lower Leg o Patient to wear own compression stockings o Elevate legs to the level of the heart and pump ankles as often as possible Medications-please add to medication list. Wound #1 Left,Lateral Lower Leg o P.O. Antibiotics - Prescribed by Dermatology for Staph Infection [name], [name] ([id number]) Electronic Signature(s) Signed: 09/21/2018 4:49:37 PM By: Worthy Keeler PA-C Signed: 09/24/2018 8:05:03 AM By: Harold Barban Entered By: Harold Barban on 09/21/2018 08:43:26 [name], [name] ([id number]) -------------------------------------------------------------------------------- Problem List Details Patient Name: [name] Date of Service: 09/21/2018 8:15 AM Medical Record Number: [id number] Patient Account Number: [id number] Date of Birth/Sex: 10-Dec-1980 (37 y.o. M) Treating RN: Harold Barban Primary Care Provider: [name] Other Clinician: Referring Provider: [name] Treating Provider/Extender: Melburn Hake, Mignon Bechler Weeks in Treatment: 41 Active Problems ICD-10 Evaluated Encounter Code Description Active Date Today Diagnosis L97.222 Non-pressure chronic ulcer of left calf with fat layer exposed 12/04/2016 No Yes L88 Pyoderma gangrenosum 03/26/2017 No Yes I87.2 Venous insufficiency (chronic) (peripheral) 12/04/2016 No Yes Inactive Problems ICD-10 Code Description Active Date Inactive Date L97.213 Non-pressure chronic ulcer of right calf with necrosis of muscle 04/02/2017 04/02/2017 Resolved Problems Electronic Signature(s) Signed: 09/21/2018 4:49:37 PM By: Worthy Keeler PA-C Entered By: Worthy Keeler on 09/21/2018 08:16:42 [name] ([id number]) -------------------------------------------------------------------------------- Progress Note Details Patient Name: [name] Date of Service: 09/21/2018 8:15 AM Medical Record Number: [id number] Patient Account Number: [id number] Date of Birth/Sex: 1980-12-24 (37 y.o. M) Treating RN: Harold Barban Primary Care Provider: [name] Other Clinician: Referring Provider: [name] Treating Provider/Extender: Melburn Hake, Britnay Magnussen Weeks in Treatment: 13 Subjective Chief Complaint Information obtained from Patient He is here in follow up evaluation for LLE pyoderma ulcer History of Present Illness (HPI) 12/04/16; 38 year old man who comes into the clinic today for review of a wound on the posterior left calf. He tells me that is been there for about a year. He is not a diabetic he does smoke half a pack per day. He was seen in the ER on 11/20/16  felt to have cellulitis around the wound and was given clindamycin. An x-ray did not show osteomyelitis. The patient initially tells me that he has a milk allergy that sets off a pruritic itching rash on his lower legs which [name] scratches incessantly and he  thinks that's what may have set up the wound. He has been using various topical antibiotics and ointments without any effect. He works in a [employer] and is on his feet all day. He does not have a prior history of wounds however he does have the rash on both lower legs the right arm and the ventral aspect of his left arm. These are excoriations and clearly have had scratching however there are of macular looking areas on both legs including a substantial larger area on the right leg. This does not have an underlying open area. There is no blistering. The patient tells me that 2 years ago in [place] in response to the rash on his legs he saw a dermatologist who told him he had a condition which may be pyoderma gangrenosum although I may be putting words into his mouth. He seemed to recognize this. 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 He is not in any treatment for this. He's had no recent travel 12/11/16; the patient arrives today with his wound and roughly the same condition we've been using silver alginate this is a deep punched out wound with some surrounding erythema but no tenderness. Biopsy I did did not show confirmed pyoderma gangrenosum suggested nonspecific inflammation and vasculitis but does not provide an actual description of what was seen by the pathologist. I'm really not able to understand this We have also received information from the patient's dermatologist in [place] notes from April 2016. This was a doctor Agarwal- antal. 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 He was prescribed topical steroid high potency under occlusion which helped but at this point the patient did not have a deep punched out wound. 12/18/16; the patient's wound is larger in terms of surface area however this surface looks better and there is less depth. The surrounding erythema also is better. The patient states that the wrap we put on came off  2 days ago when he has been using his compression stockings. He we are in the process of getting a dermatology consult. 12/26/16 on evaluation today patient's left lower extremity wound shows evidence of infection with surrounding erythema noted. He has been tolerating the dressing changes but states that he has noted more discomfort. There is a larger area of erythema surrounding the wound. No fevers, chills, nausea, or vomiting noted at this time. With that being said the wound still does have slough covering the surface. He is not allergic to any medication that he is aware of at this point. In regard to his right lower extremity he had several regions that are erythematous and pruritic he wonders if there's anything we can do to help that. 01/02/17 I reviewed patient's wound culture which was obtained his visit last week. He was placed on doxycycline at that point. Unfortunately that does not appear to be an antibiotic that would likely help with the situation however the pseudomonas noted on culture is sensitive to Cipro. Also unfortunately patient's wound seems to have a large compared to last week's evaluation. Not severely so but there are definitely increased measurements in general. He is continuing to have discomfort as well he writes this to be a  seven out of 10. In fact he would prefer me not to perform any debridement today due to the fact that he is having discomfort and considering he has an active infection on the little reluctant to do so anyway. No fevers, Vollmer, Kevion ([id number]) chills, nausea, or vomiting noted at this time. 01/08/17; patient seems dermatology on September 5. I suspect dermatology will want the slides from the biopsy I did sent to their pathologist. I'm not sure if there is a way we can expedite that. In any case the culture I did before I left on vacation 3 weeks ago showed Pseudomonas he was given 10 days of Cipro and per her description of her intake nurses is  actually somewhat better this week although the wound is quite a bit bigger than I remember the last time I saw this. He still has 3 more days of Cipro 01/21/17; dermatology appointment tomorrow. He has completed the ciprofloxacin for Pseudomonas. Surface of the wound looks better however he is had some deterioration in the lesions on his right leg. Meantime the left lateral leg wound we will continue with sample 01/29/17; patient had his dermatology appointment but I can't yet see that note. He is completed his antibiotics. The wound is more superficial but considerably larger in circumferential area than when he came in. This is in his left lateral calf. He also has swollen erythematous areas with superficial wounds on the right leg and small papular areas on both arms. There apparently areas in her his upper thighs and buttocks I did not look at those. Dermatology biopsied the right leg. Hopefully will have their input next week. 02/05/17; patient went back to see his dermatologist who told him that he had a "scratching problem" as well as staph. He is now on a 30 day course of doxycycline and I believe [name] gave him triamcinolone cream to the right leg areas to help with the itching [not exactly sure but probably triamcinolone]. [name] apparently looked at the left lateral leg wound although this was not rebiopsied and I think felt to be ultimately part of the same pathogenesis. He is using sample border foam and changing nevus himself. He now has a new open area on the right posterior leg which was his biopsy site I don't have any of the dermatology notes 02/12/17; we put the patient in compression last week with SANTYL to the wound on the left leg and the biopsy. Edema is much better and the depth of the wound is now at level of skin. Area is still the same Biopsy site on the right lateral leg we've also been using santyl with a border foam dressing and he is changing this himself. 02/19/17; Using  silver alginate started last week to both the substantial left leg wound and the biopsy site on the right wound. He is tolerating compression well. Has a an appointment with his primary M.D. tomorrow wondering about diuretics although I'm wondering if the edema problem is actually lymphedema 02/26/17; the patient has been to see his primary doctor Dr. Jerrel Ivory at [place] our primary care. [name] started him on Lasix 20 mg and this seems to have helped with the edema. However we are not making substantial change with the left lateral calf wound and inflammation. The biopsy site on the right leg also looks stable but not really all that different. 03/12/17; the patient has been to see vein and vascular Dr. Lucky Cowboy. He has had venous reflux studies I have not reviewed  these. I did get a call from his dermatology office. 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 They therefore recommended clobetasol and tetracycline i.e. 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 03/26/17; apparently the patient just had reflux studies not an appointment with Dr. dew. [name] arrives in clinic today having applied clobetasol for 2-3 weeks. He notes over the last 2-3 days excessive drainage having to change the dressing 3-4 times a day and also expanding erythema. 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 Lillia Carmel at [facility] dermatology locally who kindly saw him at my request. 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 My concern was the degree of erythema medially extending from his left leg wound which was either inflammation from pyoderma or cellulitis. I put him on Augmentin however culture of the wound showed Pseudomonas which is quinolone sensitive. I really don't believe he has cellulitis however in view of everything I will continue and give him a course of Cipro. He is also on doxycycline as an immune modulator for the pyoderma. In addition to his original wound on the left lateral leg with surrounding erythema he has a wound on the right posterior calf which was an original biopsy site done by dermatology. 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 Saw Dr. Lillia Carmel yesterday. He has been using topical antibiotics to both wound areas his original wound on the left and the biopsies/pathergy area on the right. There is definitely some improvement in the inflammation around the wound on the right although the patient states he has increasing sensitivity of the wounds. He is on prednisone 60 and doxycycline 1 as prescribed by Dr. Lillia Carmel. He is covering the topical antibiotic with gauze and putting this in his own compression stocks and changing this daily. He states that Dr. Lottie Rater did a culture of the left leg wound yesterday 05/07/17; pyoderma gangrenosum. The patient saw Dr. Lillia Carmel yesterday and has a follow-up with her in one month. He is still using topical antibiotics to both wounds although he can't recall exactly what type. He is still on prednisone 60 mg. Dr. Lillia Carmel stated that the doxycycline could stop if we were in agreement. 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 The right medial leg was induced by [name]  ([id number]) biopsy/pathergy. The area on the right is essentially healed. 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 The right medial leg has closed and remains closed. He is still on prednisone 60. He tells me he missed his last dermatology appointment with Dr. Lillia Carmel but will make another appointment. He reports that her blood sugar at a recent screen in [place] was high 200's. He was 180 today. 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  I think he is going to require still much longer prednisone perhaps another 3 months before attempting to taper. In the meantime his wound is a lot better. Smaller. He is cleaning this off daily and applying topical antibiotics. When he was last in the clinic I thought about changing to [facility] and actually put in a couple of calls to dermatology although probably not during their business hours. 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 He continues on oral prednisone. He has been using triple antibiotic ointment. There is surface debris and we will transition to [facility] and have him return in 2 weeks. He has lost 30 pounds since his last appointment with lifestyle modification. He may benefit from topical steroid cream for treatment this can be considered at a later date. 08/22/17 on evaluation today patient appears to actually be doing rather well in regard to his left lateral lower extremity ulcer. He has actually been managed by Dr. Dellia Nims most recently. Patient is currently on oral steroids at this time. This seems to have been of benefit for him. Nonetheless his last visit was actually with Leah on 08/06/17. Currently he is not utilizing any topical steroid creams although this could be of benefit as well. No fevers, chills,  nausea, or vomiting noted at this time. 09/05/17 on evaluation today patient appears to be doing better in regard to his left lateral lower extremity ulcer. He has been tolerating the dressing changes without complication. He is using Santyl with good effect. Overall I'm very pleased with how things are standing at this point. Patient likewise is happy that this is doing better. 09/19/17 on evaluation today patient actually appears to be doing rather well in regard to his left lateral lower extremity ulcer. 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 He's not having any significant pain. 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 He has been tolerating the Santyl without complication and in general I feel like he's making good progress. 10/17/17 on evaluation today patient appears to be doing very well in regard to his left lateral lower surety ulcer. He has been tolerating the dressing changes without complication. There does not appear to be any evidence of infection he's alternating the Santyl and the triple antibiotic ointment every other day this seems to be doing well for him. 11/03/17 on evaluation today patient appears to be doing very well in regard to his left lateral lower extremity ulcer. He is been tolerating the dressing changes without complication which is good news. Fortunately there does not appear to be any evidence of infection which is also great news. Overall is doing excellent they are starting to taper down on the prednisone is down to 40 mg at this point it also started topical clobetasol for him. 11/17/17 on evaluation today patient appears to be doing well in regard to his left lateral lower surety ulcer. He's been tolerating the dressing changes without complication. He does note that he is  having no pain, no excessive drainage or discharge, and overall he feels like things are going about how he would expect and hope they would. Overall he seems to have no evidence of infection at this time  in my opinion which is good news. 12/04/17-He is seen in follow-up evaluation for right lateral lower extremity ulcer. He has been applying topical steroid cream. Today's measurement show slight increase in size. Over the next 2 weeks we will transition to every other day Santyl and steroid cream. He has been encouraged to monitor for changes and notify clinic with any concerns 12/15/17 on evaluation today patient's left lateral motion the ulcer and fortunately is doing worse again at this point. This just since last week to this week has close to doubled in size according to the patient. I did not seeing last week's I do not have a visual to compare this to in our system was also down so we do not have all the charts and at this point. Nonetheless it does have me somewhat concerned in regard to the fact that again he was worried enough about it he has contact the dermatology that placed them back on the full strength, 50 mg a day of the prednisone that he was taken previous. He continues to alternate using clobetasol along with Santyl at this point. He is obviously somewhat frustrated. 12/22/17 on evaluation today patient appears to be doing a little worse compared to last evaluation. Unfortunately the wound is a little deeper and slightly larger than the last week's evaluation. With that being said he has made some progress in regard to the irritation surrounding at this time unfortunately despite that progress that's been made he still has a significant issue going on here. I'm not certain that he is having really any true infection at this time although with the Pyoderma gangrenosum it can [name], [name] ([id number]) sometimes be difficult to differentiate infection versus just inflammation. For that  reason I discussed with him today the possibility of perform a wound culture to ensure there's nothing overtly infected. 01/06/18 on evaluation today patient's wound is larger and deeper than previously evaluated. With that being said it did appear that his wound was infected after my last evaluation with him. Subsequently I did end up prescribing a prescription for Bactrim DS which [name] has been taking and having no complication with. Fortunately there does not appear to be any evidence of infection at this point in time as far as anything spreading, no want to touch, and overall I feel like things are showing signs of improvement. 01/13/18 on evaluation today patient appears to be even a little larger and deeper than last time. There still muscle exposed in the base of the wound. Nonetheless he does appear to be less erythematous I do believe inflammation is calming down also believe the infection looks like it's probably resolved at this time based on what I'm seeing. No fevers, chills, nausea, or vomiting noted at this time. 01/30/18 on evaluation today patient actually appears to visually look better for the most part. Unfortunately those visually this looks better he does seem to potentially have what may be an abscess in the muscle that has been noted in the central portion of the wound. This is the first time that I have noted what appears to be fluctuance in the central portion of the muscle. With that being said I'm somewhat more concerned about the fact that this might indicate an abscess formation at this location. I do believe that an ultrasound would be appropriate. This is likely something we need to try to do as soon as possible. He has been switch to mupirocin ointment and he is no longer using the steroid ointment  as prescribed by dermatology he sees them again next week he's been decreased from 60 to 40 mg of prednisone. 03/09/18 on evaluation today patient actually appears to be  doing a little better compared to last time I saw him. There's not as much erythema surrounding the wound itself. He I did review his most recent infectious disease note which was dated 02/24/18. He saw Dr. Michel Bickers in [place]. 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 Patient is now off of antibiotics and they are continually observing this. There seems to be no change in the past two weeks in my pinion based on what the patient says and what I see today compared to what Dr. Megan Salon likely saw two weeks ago. No fevers, chills, nausea, or vomiting noted at this time. 03/23/18 on evaluation today patient's wound actually appears to be showing signs of improvement which is good news. He is currently still on the Dapsone. He is also working on tapering the prednisone to get off of this and Dr. Lottie Rater is working with him in this regard. Nonetheless overall I feel like the wound is doing well it does appear based on the infectious disease note that I reviewed from Dr. 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 I did also review the results of his ultrasound of left lower extremity which revealed there was a dentist tissue in the base of the wound without an abscess noted. 04/06/18 on evaluation today the patient's left lateral lower extremity ulcer actually appears to be doing fairly well which is excellent news. There does not appear to be any evidence of infection at this time which is also great news. Overall he still does have a significantly large ulceration although little by little he seems to be making progress. He is down to 10 mg a day of the prednisone. 04/20/18 on evaluation today patient actually appears to be doing excellent at this time in regard to his left lower extremity ulcer.  He's making signs of good progress unfortunately this is taking much longer than we would really like to see but nonetheless he is making progress. Fortunately there does not appear to be any evidence of infection at this time. No fevers, chills, nausea, or vomiting noted at this time. The patient has not been using the Santyl due to the cost he hadn't got in this field yet. He's mainly been using the antibiotic ointment topically. Subsequently he also tells me that he really has not been scrubbing in the shower I think this would be helpful again as I told him it doesn't have to be anything too aggressive to even make it believe just enough to keep it free of some of the loose slough and biofilm on the wound surface. 05/11/18 on evaluation today patient's wound appears to be making slow but sure progress in regard to the left lateral lower extremity ulcer. He is been tolerating the dressing changes without complication. Fortunately there does not appear to be any evidence of infection at this time. He is still just using triple antibiotic ointment along with clobetasol occasionally over the area. He never got the Santyl and really does not seem to intend to in my pinion. 06/01/18 on evaluation today patient appears to be doing a little better in regard  to his left lateral lower extremity ulcer. He states that overall he does not feel like he is doing as well with the Dapsone as he did with the prednisone. Nonetheless he sees his dermatologist later today and is gonna talk to them about the possibility of going back on the prednisone. Overall again I believe that the wound would be better if you would utilize Santyl but he really does not seem to be interested in going back to the [place] at this point. He has been using triple antibiotic ointment. [name], [name] ([id number]) 06/15/18 on evaluation today patient's wound actually appears to be doing about the same at this point. Fortunately there is  no signs of infection at this time. He has made slight improvements although he continues to not really want to clean the wound bed at this point. He states that he just doesn't mess with it he doesn't want to cause any problems with everything else he has going on. He has been on medication, antibiotics as prescribed by his dermatologist, for a staff infection of his lower extremities which is really drying out now and looking much better he tells me. Fortunately there is no sign of overall infection. 06/29/18 on evaluation today patient appears to be doing well in regard to his left lateral lower surety ulcer all things considering. Fortunately his staff infection seems to be greatly improved compared to previous. He has no signs of infection and this is drying up quite nicely. He is still the doxycycline for this is no longer on cental, Dapsone, or any of the other medications. His dermatologist has recommended possibility of an infusion but right now he does not want to proceed with that. 07/13/18 on evaluation today patient appears to be doing about the same in regard to his left lateral lower surety ulcer. Fortunately there's no signs of infection at this time which is great news. Unfortunately he still builds up a significant amount of Slough/biofilm of the surface of the wound he still is not really cleaning this as he should be appropriately. Again I'm able to easily with saline and gauze remove the majority of this on the surface which if you would do this at home would likely be a dramatic improvement for him as far as getting the area to improve. Nonetheless overall I still feel like he is making progress is just very slow. I think Santyl will be of benefit for him as well. Still he has not gotten this as of this point. 07/27/18 on evaluation today patient actually appears to be doing little worse in regards of the erythema around the periwound region of the wound he also tells me that he's  been having more drainage currently compared to what he was experiencing last time I saw him. He states not quite as bad as what he had because this was infected previously but nonetheless is still appears to be doing poorly. Fortunately there is no evidence of systemic infection at this point. The patient tells me that he is not going to be able to afford the Santyl. He is still waiting to hear about the infusion therapy with his dermatologist. Apparently [name] wants an updated colonoscopy first. 08/10/18 on evaluation today patient appears to be doing better in regard to his left lateral lower extremity ulcer. Fortunately he is showing signs of improvement in this regard he's actually been approved for Remicade infusion's as well although this has not been scheduled as of yet. Fortunately there's no signs of active  infection at this time in regard to the wound although he is having some issues with infection of the right lower extremity is been seen as dermatologist for this. Fortunately they are definitely still working with him trying to keep things under control. 09/07/18 on evaluation today patient is actually doing rather well in regard to his left lateral lower extremity ulcer. He notes these actually having some hair grow back on his extremity which is something he has not seen in years. He also tells me that the pain is really not giving them any trouble at this time which is also good news overall [name] is very pleased with the progress he's using a combination of the mupirocin along with the probate is all mixed. 09/21/18 on evaluation today patient actually appears to be doing fairly well all things considered in regard to his looks from the ulcer. He's been tolerating the dressing changes without complication. Fortunately there's no signs of active infection at this time which is good news he is still on all antibiotics or prevention of the staff infection. He has been on prednisone for  time although he states it is gonna contact his dermatologist and see if [name] put them on a short course due to some irritation that he has going on currently. Fortunately there's no evidence of any overall worsening this is going very slow I think cental would be something that would be helpful for him although he states that $50 for tube is quite expensive. He therefore is not willing to get that at this point. Patient History Information obtained from Patient. Family History Diabetes - Mother,Maternal Grandparents, Heart Disease - Mother,Maternal Grandparents, Hereditary Spherocytosis - Siblings, Hypertension - Maternal Grandparents, Stroke - Siblings, No family history of Cancer, Kidney Disease, Lung Disease, Seizures, Thyroid Problems, Tuberculosis. Social History Current some day smoker, Marital Status - Married, Alcohol Use - Rarely, Drug Use - No History, Caffeine Use - Moderate. Medical History Eyes Denies history of Cataracts, Glaucoma, Optic Neuritis [name], [name] ([id number]) Ear/Nose/Mouth/Throat Denies history of Chronic sinus problems/congestion, Middle ear problems Hematologic/Lymphatic Denies history of Anemia, Hemophilia, Human Immunodeficiency Virus, Lymphedema, Sickle Cell Disease Respiratory Patient has history of Sleep Apnea - cpap Denies history of Aspiration, Asthma, Chronic Obstructive Pulmonary Disease (COPD), Pneumothorax Cardiovascular Patient has history of Hypertension Denies history of Angina, Arrhythmia, Congestive Heart Failure, Coronary Artery Disease, Deep Vein Thrombosis, Hypotension, Myocardial Infarction, Peripheral Arterial Disease, Peripheral Venous Disease, Phlebitis, Vasculitis Gastrointestinal Patient has history of Colitis Endocrine Denies history of Type I Diabetes, Type II Diabetes Genitourinary Denies history of End Stage Renal Disease Immunological Denies history of Lupus Erythematosus, Raynaud s, Scleroderma Integumentary  (Skin) Denies history of History of Burn, History of pressure wounds Musculoskeletal Denies history of Gout, Rheumatoid Arthritis, Osteoarthritis, Osteomyelitis Neurologic Denies history of Dementia, Neuropathy, Quadriplegia, Paraplegia, Seizure Disorder Oncologic Denies history of Received Chemotherapy, Received Radiation Psychiatric Denies history of Anorexia/bulimia, Confinement Anxiety Review of Systems (ROS) Constitutional Symptoms (General Health) Denies complaints or symptoms of Fatigue, Fever, Chills, Marked Weight Change. Respiratory Denies complaints or symptoms of Chronic or frequent coughs, Shortness of Breath. Cardiovascular Complains or has symptoms of LE edema. Denies complaints or symptoms of Chest pain. Psychiatric Denies complaints or symptoms of Anxiety, Claustrophobia. Objective Constitutional Well-nourished and well-hydrated in no acute distress. Vitals Time Taken: 8:18 AM, Height: 71 in, Weight: 338 lbs, BMI: 47.1, Temperature: 98.6 F, Pulse: 80 bpm, Respiratory Rate: 16 breaths/min, Blood Pressure: 138/70 mmHg. Respiratory Alper, Bharath ([id number]) normal breathing without difficulty. clear to auscultation  bilaterally. Cardiovascular regular rate and rhythm with normal S1, S2. Psychiatric this patient is able to make decisions and demonstrates good insight into disease process. Alert and Oriented x 3. pleasant and cooperative. General Notes: Patient's wound bed currently did have some tightly inherent Slough noted on the surface of the wound unfortunately. Fortunately there is no evidence of infection and this is measuring smaller he is slowly but surely making progress which is good news. Integumentary (Hair, Skin) Wound #1 status is Open. Original cause of wound was Gradually Appeared. The wound is located on the Left,Lateral Lower Leg. The wound measures 2.2cm length x 2.4cm width x 0.2cm depth; 4.147cm^2 area and 0.829cm^3 volume. There is Fat Layer  (Subcutaneous Tissue) Exposed exposed. There is no tunneling or undermining noted. There is a medium amount of serous drainage noted. The wound margin is distinct with the outline attached to the wound base. There is no granulation within the wound bed. There is a large (67-100%) amount of necrotic tissue within the wound bed including Adherent Slough. The periwound skin appearance exhibited: Induration, Scarring, Hemosiderin Staining, Rubor. The periwound skin appearance did not exhibit: Callus, Crepitus, Excoriation, Rash, Dry/Scaly, Maceration, Atrophie Blanche, Cyanosis, Ecchymosis, Mottled, Pallor, Erythema. Periwound temperature was noted as No Abnormality. The periwound has tenderness on palpation. 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 Anesthetic (add to Medication List): Wound #1 Left,Lateral Lower Leg: Topical Lidocaine 4% cream applied to wound bed prior to debridement (In Clinic Only). Primary Wound Dressing: Wound #1 Left,Lateral Lower Leg: Santyl Ointment - In clinic Other: - Mupirocin mix half and half with clobetasol Secondary Dressing: Wound #1 Left,Lateral Lower Leg: Conform/Kerlix Non-adherent pad Stick, Maddock ([id number]) Dressing Change Frequency: Wound #1 Left,Lateral Lower Leg: Change dressing every day. Follow-up Appointments: Wound #1 Left,Lateral Lower Leg: Return Appointment in 2 weeks. Edema Control: Wound #1 Left,Lateral Lower Leg: Patient to wear own compression stockings Elevate legs to the level of the heart and pump ankles as often as possible Medications-please add to medication list.: Wound #1 Left,Lateral Lower Leg: P.O. Antibiotics - Prescribed by Dermatology for Staph Infection My suggestion  currently is gonna be that we continue with the above wound care measures. I again discussed the possibility of [place] with him but again this is somewhat expensive he states for him to get. We will therefore go ahead and continue with his current regimen I did recommend that we switch to utilizing a telephone not adherent pad with no adhesive and secure this with a gauze wrap in order to avoid any he's up to the skin which I think is causing irritation. He's in agreement with give this a try. Otherwise we will see were things stand at follow-up. Please see above for specific wound care orders. We will see patient for re-evaluation in 2 week(s) here in the clinic. If anything worsens or changes patient will contact our office for additional recommendations. Electronic Signature(s) Signed: 09/21/2018 4:49:37 PM By: Worthy Keeler PA-C Entered By: Worthy Keeler on 09/21/2018 08:48:37 [name], [name] ([id number]) -------------------------------------------------------------------------------- ROS/PFSH Details Patient Name: [name] Date of Service: 09/21/2018 8:15 AM Medical Record Number: [id number] Patient Account Number: [id number] Date of Birth/Sex: [birth year] (37 y.o. M) Treating RN: Harold Barban Primary Care Provider: [name] Other Clinician: Referring Provider: [name] Treating Provider/Extender: Melburn Hake, Kimari Lienhard Weeks in  Treatment: 93 Information Obtained From Patient Constitutional Symptoms (General Health) Complaints and Symptoms: Negative for: Fatigue; Fever; Chills; Marked Weight Change Respiratory Complaints and Symptoms: Negative for: Chronic or frequent coughs; Shortness of Breath Medical History: Positive for: Sleep Apnea - cpap Negative for: Aspiration; Asthma; Chronic Obstructive Pulmonary Disease (COPD); Pneumothorax Cardiovascular Complaints and Symptoms: Positive for: LE edema Negative for: Chest pain Medical History: Positive for:  Hypertension Negative for: Angina; Arrhythmia; Congestive Heart Failure; Coronary Artery Disease; Deep Vein Thrombosis; Hypotension; Myocardial Infarction; Peripheral Arterial Disease; Peripheral Venous Disease; Phlebitis; Vasculitis Psychiatric Complaints and Symptoms: Negative for: Anxiety; Claustrophobia Medical History: Negative for: Anorexia/bulimia; Confinement Anxiety Eyes Medical History: Negative for: Cataracts; Glaucoma; Optic Neuritis Ear/Nose/Mouth/Throat Medical History: Negative for: Chronic sinus problems/congestion; Middle ear problems Hematologic/Lymphatic Medical History: Negative for: Anemia; Hemophilia; Human Immunodeficiency Virus; Lymphedema; Sickle Cell Disease Kirsten, Apolinar ([id number]) Gastrointestinal Medical History: Positive for: Colitis Endocrine Medical History: Negative for: Type I Diabetes; Type II Diabetes Genitourinary Medical History: Negative for: End Stage Renal Disease Immunological Medical History: Negative for: Lupus Erythematosus; Raynaudos; Scleroderma Integumentary (Skin) Medical History: Negative for: History of Burn; History of pressure wounds Musculoskeletal Medical History: Negative for: Gout; Rheumatoid Arthritis; Osteoarthritis; Osteomyelitis Neurologic Medical History: Negative for: Dementia; Neuropathy; Quadriplegia; Paraplegia; Seizure Disorder Oncologic Medical History: Negative for: Received Chemotherapy; Received Radiation Immunizations Pneumococcal Vaccine: Received Pneumococcal Vaccination: No Implantable Devices No devices added Family and Social History Cancer: No; Diabetes: Yes - Mother,Maternal Grandparents; Heart Disease: Yes - Mother,Maternal Grandparents; Hereditary Spherocytosis: Yes - Siblings; Hypertension: Yes - Maternal Grandparents; Kidney Disease: No; Lung Disease: No; Seizures: No; Stroke: Yes - Siblings; Thyroid Problems: No; Tuberculosis: No; Current some day smoker; Marital Status - Married;  Alcohol Use: Rarely; Drug Use: No History; Caffeine Use: Moderate; Financial Concerns: No; Food, Clothing or Shelter Needs: No; Support System Lacking: No; Transportation Concerns: No Physician Affirmation I have reviewed and agree with the above information. Electronic Signature(s) SHEMAR, PLEMMONS ([id number]) Signed: 09/21/2018 4:49:37 PM By: Worthy Keeler PA-C Signed: 09/24/2018 8:05:03 AM By: Harold Barban Entered By: Worthy Keeler on 09/21/2018 08:46:56 GRAYLING, SCHRANZ ([id number]) -------------------------------------------------------------------------------- SuperBill Details Patient Name: [name] Date of Service: 09/21/2018 Medical Record Number: [id number] Patient Account Number: [id number] Date of Birth/Sex: 1981-02-24 (37 y.o. M) Treating RN: Harold Barban Primary Care Provider: [name] Other Clinician: Referring Provider: [name] Treating Provider/Extender: Melburn Hake, Nekayla Heider Weeks in Treatment: 7 Diagnosis Coding ICD-10 Codes Code Description [phone] Non-pressure chronic ulcer of left calf with fat layer exposed L88 Pyoderma gangrenosum I87.2 Venous insufficiency (chronic) (peripheral) Facility Procedures CPT4 Code: 46286381 Description: 77116 - WOUND CARE VISIT-LEV 2 EST PT Modifier: Quantity: 1 Physician Procedures CPT4 Code: [id number] Description: 33832 - WC PHYS LEVEL 4 - EST PT ICD-10 Diagnosis Description L97.222 Non-pressure chronic ulcer of left calf with fat layer expo L88 Pyoderma gangrenosum I87.2 Venous insufficiency (chronic) (peripheral) Modifier: sed Quantity: 1 Electronic Signature(s) Signed: 09/21/2018 4:49:37 PM By: Worthy Keeler PA-C Entered By: Worthy Keeler on 09/21/2018 08:48:47

## 2018-09-24 NOTE — Progress Notes (Signed)
ALBIN, DUCKETT ([id number]) Visit Report for 09/21/2018 Arrival Information Details Patient Name: [name], [name] Date of Service: 09/21/2018 8:15 AM Medical Record Number: [id number] Patient Account Number: [id number] Date of Birth/Sex: 10-10-1980 (38 y.o. M) Treating RN: Montey Hora Primary Care Keanen Dohse: [name] Other Clinician: Referring Antrell Tipler: [name] Treating Burle Kwan/Extender: Melburn Hake, [name]: 44 Visit Information History Since Last Visit Added or deleted any medications: No Patient Arrived: Ambulatory Any new allergies or adverse reactions: No Arrival Time: 08:17 Had a fall or experienced change in No Accompanied By: self activities of daily living that may affect Transfer Assistance: None risk of falls: Patient Identification Verified: Yes Signs or symptoms of abuse/neglect since last visito No Secondary Verification Process Completed: Yes Hospitalized since last visit: No Patient Requires Transmission-Based No Implantable device outside of the clinic excluding No Precautions: cellular tissue based products placed in the center Patient Has Alerts: No since last visit: Has Dressing in Place as Prescribed: Yes Has Compression in Place as Prescribed: Yes Pain Present Now: No Electronic Signature(s) Signed: 09/21/2018 3:28:19 PM By: Montey Hora Entered By: Montey Hora on 09/21/2018 08:17:28 [name] ([id number]) -------------------------------------------------------------------------------- Clinic Level of Care Assessment Details Patient Name: [name] Date of Service: 09/21/2018 8:15 AM Medical Record Number: [id number] Patient Account Number: [id number] Date of Birth/Sex: March 29, 1981 (37 y.o. M) Treating RN: Harold Barban Primary Care Shivali Quackenbush: [name] Other Clinician: Referring Mayo Faulk: [name] Treating Jasper Ruminski/Extender: Melburn Hake, [name]: 21 Clinic Level of Care Assessment  Items TOOL 4 Quantity Score []  - Use when only an EandM is performed on FOLLOW-UP visit 0 ASSESSMENTS - Nursing Assessment / Reassessment X - Reassessment of Co-morbidities (includes updates in patient status) 1 10 X- 1 5 Reassessment of Adherence to Treatment Plan ASSESSMENTS - Wound and Skin Assessment / Reassessment X - Simple Wound Assessment / Reassessment - one wound 1 5 []  - 0 Complex Wound Assessment / Reassessment - multiple wounds []  - 0 Dermatologic / Skin Assessment (not related to wound area) ASSESSMENTS - Focused Assessment []  - Circumferential Edema Measurements - multi extremities 0 []  - 0 Nutritional Assessment / Counseling / Intervention []  - 0 Lower Extremity Assessment (monofilament, tuning fork, pulses) []  - 0 Peripheral Arterial Disease Assessment (using hand held doppler) ASSESSMENTS - Ostomy and/or Continence Assessment and Care []  - Incontinence Assessment and Management 0 []  - 0 Ostomy Care Assessment and Management (repouching, etc.) PROCESS - Coordination of Care X - Simple Patient / Family Education for ongoing care 1 15 []  - 0 Complex (extensive) Patient / Family Education for ongoing care []  - 0 Staff obtains Programmer, systems, Records, Test Results / Process Orders []  - 0 Staff telephones HHA, Nursing Homes / Clarify orders / etc []  - 0 Routine Transfer to another Facility (non-emergent condition) []  - 0 Routine Hospital Admission (non-emergent condition) []  - 0 New Admissions / Biomedical engineer / Ordering NPWT, Apligraf, etc. []  - 0 Emergency Hospital Admission (emergent condition) X- 1 10 Simple Discharge Coordination [name], [name] ([id number]) []  - 0 Complex (extensive) Discharge Coordination PROCESS - Special Needs []  - Pediatric / Minor Patient Management 0 []  - 0 Isolation Patient Management []  - 0 Hearing / Language / Visual special needs []  - 0 Assessment of Community assistance (transportation, D/C planning, etc.) []  -  0 Additional assistance / Altered mentation []  - 0 Support Surface(s) Assessment (bed, cushion, seat, etc.) INTERVENTIONS - Wound Cleansing / Measurement X - Simple Wound Cleansing - one wound 1 5 []  - 0  Complex Wound Cleansing - multiple wounds X- 1 5 Wound Imaging (photographs - any number of wounds) []  - 0 Wound Tracing (instead of photographs) X- 1 5 Simple Wound Measurement - one wound []  - 0 Complex Wound Measurement - multiple wounds INTERVENTIONS - Wound Dressings X - Small Wound Dressing one or multiple wounds 1 10 []  - 0 Medium Wound Dressing one or multiple wounds []  - 0 Large Wound Dressing one or multiple wounds []  - 0 Application of Medications - topical []  - 0 Application of Medications - injection INTERVENTIONS - Miscellaneous []  - External ear exam 0 []  - 0 Specimen Collection (cultures, biopsies, blood, body fluids, etc.) []  - 0 Specimen(s) / Culture(s) sent or taken to Lab for analysis []  - 0 Patient Transfer (multiple staff / Civil Service fast streamer / Similar devices) []  - 0 Simple Staple / Suture removal (25 or less) []  - 0 Complex Staple / Suture removal (26 or more) []  - 0 Hypo / Hyperglycemic Management (close monitor of Blood Glucose) []  - 0 Ankle / Brachial Index (ABI) - do not check if billed separately X- 1 5 Vital Signs [name], [name] ([id number]) Has the patient been seen at the hospital within the last three years: Yes Total Score: 75 Level Of Care: New/Established - Level 2 Electronic Signature(s) Signed: 09/24/2018 8:05:03 AM By: Harold Barban Entered By: Harold Barban on 09/21/2018 08:40:38 [name] ([id number]) -------------------------------------------------------------------------------- Encounter Discharge Information Details Patient Name: [name] Date of Service: 09/21/2018 8:15 AM Medical Record Number: [id number] Patient Account Number: [id number] Date of Birth/Sex: 08/02/1980 (37 y.o. M) Treating RN: Harold Barban Primary  Care Flor Houdeshell: [name] Other Clinician: Referring Jaycub Noorani: [name] Treating Julianne Chamberlin/Extender: Melburn Hake, [name]: 44 Encounter Discharge Information Items Discharge Condition: Stable Ambulatory Status: Ambulatory Discharge Destination: Home Transportation: Private Auto Accompanied By: self Schedule Follow-up Appointment: Yes Clinical Summary of Care: Electronic Signature(s) Signed: 09/24/2018 8:05:03 AM By: Harold Barban Entered By: Harold Barban on 09/21/2018 08:52:08 [name] ([id number]) -------------------------------------------------------------------------------- Lower Extremity Assessment Details Patient Name: [name] Date of Service: 09/21/2018 8:15 AM Medical Record Number: [id number] Patient Account Number: [id number] Date of Birth/Sex: 06-22-1980 (37 y.o. M) Treating RN: Montey Hora Primary Care Brittiny Levitz: [name] Other Clinician: Referring Tenasia Aull: [name] Treating Onnika Siebel/Extender: Melburn Hake, [name]: 34 Edema Assessment Assessed: [Left: No] [Right: No] Edema: [Left: Ye] [Right: s] Vascular Assessment Pulses: Dorsalis Pedis Palpable: [Left:Yes] Electronic Signature(s) Signed: 09/21/2018 3:28:19 PM By: Montey Hora Entered By: Montey Hora on 09/21/2018 08:24:30 [name], [name] ([id number]) -------------------------------------------------------------------------------- Multi Wound Chart Details Patient Name: [name] Date of Service: 09/21/2018 8:15 AM Medical Record Number: [id number] Patient Account Number: [id number] Date of Birth/Sex: [birth year] (37 y.o. M) Treating RN: Harold Barban Primary Care Nevia Henkin: [name] Other Clinician: Referring Willistine Ferrall: [name] Treating Omri Bertran/Extender: Melburn Hake, [name]: 56 Vital Signs Height(in): 71 Pulse(bpm): 80 Weight(lbs): 338 Blood Pressure(mmHg): 138/70 Body Mass Index(BMI):  47 Temperature(F): 98.6 Respiratory Rate 16 (breaths/min): Photos: [N/A:N/A] Wound Location: Left Lower Leg - Lateral N/A N/A Wounding Event: Gradually Appeared N/A N/A Primary Etiology: Pyoderma N/A N/A Comorbid History: Sleep Apnea, Hypertension, N/A N/A Colitis Date Acquired: 11/18/2015 N/A N/A Weeks of Treatment: 93 N/A N/A Wound Status: Open N/A N/A Measurements L x W x D 2.2x2.4x0.2 N/A N/A (cm) Area (cm) : 4.147 N/A N/A Volume (cm) : 0.829 N/A N/A % Reduction in Area: 15.50% N/A N/A % Reduction in Volume: 78.90% N/A N/A Classification: Full Thickness With Exposed N/A N/A Support Structures  Exudate Amount: Medium N/A N/A Exudate Type: Serous N/A N/A Exudate Color: amber N/A N/A Wound Margin: Distinct, outline attached N/A N/A Granulation Amount: None Present (0%) N/A N/A Necrotic Amount: Large (67-100%) N/A N/A Exposed Structures: Fat Layer (Subcutaneous N/A N/A Tissue) Exposed: Yes Fascia: No Tendon: No Muscle: No Joint: No Bone: No [name], [name] ([id number]) Epithelialization: Small (1-33%) N/A N/A Periwound Skin Texture: Induration: Yes N/A N/A Scarring: Yes Excoriation: No Callus: No Crepitus: No Rash: No Periwound Skin Moisture: Maceration: No N/A N/A Dry/Scaly: No Periwound Skin Color: Hemosiderin Staining: Yes N/A N/A Rubor: Yes Atrophie Blanche: No Cyanosis: No Ecchymosis: No Erythema: No Mottled: No Pallor: No Temperature: No Abnormality N/A N/A Tenderness on Palpation: Yes N/A N/A Treatment Notes Electronic Signature(s) Signed: 09/24/2018 8:05:03 AM By: Harold Barban Entered By: Harold Barban on 09/21/2018 08:39:48 [name] ([id number]) -------------------------------------------------------------------------------- Multi-Disciplinary Care Plan Details Patient Name: [name] Date of Service: 09/21/2018 8:15 AM Medical Record Number: [id number] Patient Account Number: [id number] Date of Birth/Sex: Dec 30, 1980 (37 y.o.  M) Treating RN: Harold Barban Primary Care Bettejane Leavens: [name] Other Clinician: Referring Duward Allbritton: [name] Treating Shavonn Convey/Extender: Melburn Hake, [name]: 27 Active Inactive Orientation to the Wound Care Program Nursing Diagnoses: Knowledge deficit related to the wound healing center program Goals: Patient/caregiver will verbalize understanding of the [place] Program Date Initiated: 12/11/2016 Target Resolution Date: 03/13/2017 Goal Status: Active Interventions: Provide education on orientation to the wound center Notes: Venous Leg Ulcer Nursing Diagnoses: Knowledge deficit related to disease process and management Potential for venous Insuffiency (use before diagnosis confirmed) Goals: Non-invasive venous studies are completed as ordered Date Initiated: 12/11/2016 Target Resolution Date: 03/13/2017 Goal Status: Active Patient will maintain optimal edema control Date Initiated: 12/11/2016 Target Resolution Date: 03/13/2017 Goal Status: Active Patient/caregiver will verbalize understanding of disease process and disease management Date Initiated: 12/11/2016 Target Resolution Date: 03/13/2017 Goal Status: Active Verify adequate tissue perfusion prior to therapeutic compression application Date Initiated: 12/11/2016 Target Resolution Date: 03/13/2017 Goal Status: Active Interventions: Assess peripheral edema status every visit. Compression as ordered Provide education on venous insufficiency Treatment Activities: YAW, ESCOTO ([id number]) Therapeutic compression applied : 12/11/2016 Notes: Wound/Skin Impairment Nursing Diagnoses: Impaired tissue integrity Knowledge deficit related to ulceration/compromised skin integrity Goals: Patient/caregiver will verbalize understanding of skin care regimen Date Initiated: 12/11/2016 Target Resolution Date: 03/13/2017 Goal Status: Active Ulcer/skin breakdown will have a volume reduction  of 30% by week 4 Date Initiated: 12/11/2016 Target Resolution Date: 03/13/2017 Goal Status: Active Ulcer/skin breakdown will have a volume reduction of 50% by week 8 Date Initiated: 12/11/2016 Target Resolution Date: 03/13/2017 Goal Status: Active Ulcer/skin breakdown will have a volume reduction of 80% by week 12 Date Initiated: 12/11/2016 Target Resolution Date: 03/13/2017 Goal Status: Active Ulcer/skin breakdown will heal within 14 weeks Date Initiated: 12/11/2016 Target Resolution Date: 03/13/2017 Goal Status: Active Interventions: Assess patient/caregiver ability to obtain necessary supplies Assess patient/caregiver ability to perform ulcer/skin care regimen upon admission and as needed Assess ulceration(s) every visit Provide education on ulcer and skin care Treatment Activities: Skin care regimen initiated : 12/11/2016 Topical wound management initiated : 12/11/2016 Notes: Electronic Signature(s) Signed: 09/24/2018 8:05:03 AM By: Harold Barban Entered By: Harold Barban on 09/21/2018 08:39:39 [name] ([id number]) -------------------------------------------------------------------------------- Pain Assessment Details Patient Name: [name] Date of Service: 09/21/2018 8:15 AM Medical Record Number: [id number] Patient Account Number: [id number] Date of Birth/Sex: 01/06/1981 (37 y.o. M) Treating RN: Montey Hora Primary Care Ahmar Pickrell: [name] Other Clinician: Referring Fawnda Vitullo: [name] Treating Zelia Yzaguirre/Extender: Melburn Hake,  [name]: 93 Active Problems Location of Pain Severity and Description of Pain Patient Has Paino No Site Locations Pain Management and Medication Current Pain Management: Electronic Signature(s) Signed: 09/21/2018 3:28:19 PM By: Montey Hora Entered By: Montey Hora on 09/21/2018 08:17:34 [name]  ([id number]) -------------------------------------------------------------------------------- Patient/Caregiver Education Details Patient Name: [name] Date of Service: 09/21/2018 8:15 AM Medical Record Number: [id number] Patient Account Number: [id number] Date of Birth/Gender: April 03, 1981 (37 y.o. M) Treating RN: Harold Barban Primary Care Physician: [name] Other Clinician: Referring Physician: [name] Treating Physician/Extender: Sharalyn Ink in Treatment: 62 Education Assessment Education Provided To: Patient Education Topics Provided Wound/Skin Impairment: Handouts: Caring for Your Ulcer Methods: Demonstration, Explain/Verbal Responses: State content correctly Electronic Signature(s) Signed: 09/24/2018 8:05:03 AM By: Harold Barban Entered By: Harold Barban on 09/21/2018 08:40:06 [name] ([id number]) -------------------------------------------------------------------------------- Wound Assessment Details Patient Name: [name] Date of Service: 09/21/2018 8:15 AM Medical Record Number: [id number] Patient Account Number: [id number] Date of Birth/Sex: 1981-02-18 (37 y.o. M) Treating RN: Montey Hora Primary Care Keilani Terrance: [name] Other Clinician: Referring Sakai Wolford: [name] Treating Bray Vickerman/Extender: Melburn Hake, [name]: 80 Wound Status Wound Number: 1 Primary Etiology: Pyoderma Wound Location: Left Lower Leg - Lateral Wound Status: Open Wounding Event: Gradually Appeared Comorbid History: Sleep Apnea, Hypertension, Colitis Date Acquired: 11/18/2015 Weeks Of Treatment: 93 Clustered Wound: No Photos Wound Measurements Length: (cm) 2.2 Width: (cm) 2.4 Depth: (cm) 0.2 Area: (cm) 4.147 Volume: (cm) 0.829 % Reduction in Area: 15.5% % Reduction in Volume: 78.9% Epithelialization: Small (1-33%) Tunneling: No Undermining: No Wound Description Full Thickness With Exposed Support Foul Odor  A Classification: Structures Slough/Fibr Wound Margin: Distinct, outline attached Exudate Medium Amount: Exudate Type: Serous Exudate Color: amber fter Cleansing: No ino Yes Wound Bed Granulation Amount: None Present (0%) Exposed Structure Necrotic Amount: Large (67-100%) Fascia Exposed: No Necrotic Quality: Adherent Slough Fat Layer (Subcutaneous Tissue) Exposed: Yes Tendon Exposed: No Muscle Exposed: No Joint Exposed: No Bone Exposed: No Periwound Skin Texture [name], [name] ([id number]) Texture Color No Abnormalities Noted: No No Abnormalities Noted: No Callus: No Atrophie Blanche: No Crepitus: No Cyanosis: No Excoriation: No Ecchymosis: No Induration: Yes Erythema: No Rash: No Hemosiderin Staining: Yes Scarring: Yes Mottled: No Pallor: No Moisture Rubor: Yes No Abnormalities Noted: No Dry / Scaly: No Temperature / Pain Maceration: No Temperature: No Abnormality Tenderness on Palpation: Yes Treatment Notes Wound #1 (Left, Lateral Lower Leg) Notes Santyl to wound bed, TCA, non-adherent dressing, conform, tape Electronic Signature(s) Signed: 09/21/2018 3:28:19 PM By: Montey Hora Entered By: Montey Hora on 09/21/2018 08:23:09 [name] ([id number]) -------------------------------------------------------------------------------- [place] Details Patient Name: [name] Date of Service: 09/21/2018 8:15 AM Medical Record Number: [id number] Patient Account Number: [id number] Date of Birth/Sex: 08-Nov-1980 (37 y.o. M) Treating RN: Montey Hora Primary Care Amity Roes: [name] Other Clinician: Referring Felice Hope: [name] Treating Mehdi Gironda/Extender: Melburn Hake, [name]: 59 Vital Signs Time Taken: 08:18 Temperature (F): 98.6 Height (in): 71 Pulse (bpm): 80 Weight (lbs): 338 Respiratory Rate (breaths/min): 16 Body Mass Index (BMI): 47.1 Blood Pressure (mmHg): 138/70 Reference Range: 80 - 120 mg / dl Electronic  Signature(s) Signed: 09/21/2018 3:28:19 PM By: Montey Hora Entered By: Montey Hora on 09/21/2018 08:18:46

## 2018-10-05 ENCOUNTER — Ambulatory Visit: Payer: 59 | Admitting: Physician Assistant

## 2018-10-05 DIAGNOSIS — L97222 Non-pressure chronic ulcer of left calf with fat layer exposed: Secondary | ICD-10-CM | POA: Diagnosis not present

## 2018-10-05 DIAGNOSIS — G4730 Sleep apnea, unspecified: Secondary | ICD-10-CM | POA: Diagnosis not present

## 2018-10-06 ENCOUNTER — Encounter: Payer: 59 | Admitting: Physician Assistant

## 2018-10-06 ENCOUNTER — Other Ambulatory Visit: Payer: Self-pay

## 2018-10-06 ENCOUNTER — Telehealth
Admit: 2018-10-06 | Discharge: 2018-10-07 | Payer: PRIVATE HEALTH INSURANCE | Attending: Student in an Organized Health Care Education/Training Program | Primary: Student in an Organized Health Care Education/Training Program

## 2018-10-06 DIAGNOSIS — Z8719 Personal history of other diseases of the digestive system: Principal | ICD-10-CM

## 2018-10-06 DIAGNOSIS — L88 Pyoderma gangrenosum: Secondary | ICD-10-CM | POA: Diagnosis not present

## 2018-10-06 DIAGNOSIS — L97222 Non-pressure chronic ulcer of left calf with fat layer exposed: Secondary | ICD-10-CM | POA: Diagnosis not present

## 2018-10-06 NOTE — Unmapped (Signed)
It was great to speak with you today.     I have ordered a colonoscopy to evaluate your symptoms. You should be contacted to schedule it. If you have not heard from our schedulers within 3 business days or would like to schedule it as soon as possible, you can call the GI scheduling number listed below      Andrew Cameron, M.D.  Fellow, Gastroenterology    [id number] [employer]  [address]  [place], [place] [address]    Nurse Contact: Lytle Butte  Phone: [phone]  Fax: [phone]    Important Contact Numbers:    GI Clinic Appointments:   [phone]  Please call the GI Clinic appointment line if you need to schedule, reschedule or cancel an appointment in clinic.  They can also answer any questions you may have about where your appointment is located and when you need to arrive.      GI Procedure Appointments: [phone]  Please call the GI Procedures line if you need to schedule, reschedule or cancel ANY type of procedure (EGD, colonoscopy, motility testing, etc).  You can also call this number for prep instructions, etc.      Radiology: [phone], option 3 or 4  If you are being scheduled for any type of radiology, you will need to call to receive your appointment time.  Please call this number for information.     For emergencies after normal business hours or on weekends/holidays   Proceed to the nearest emergency room OR contact the [facility] Operator at [phone] who can page the Gastroenterology Fellow on call.

## 2018-10-06 NOTE — Unmapped (Signed)
Name: Andrew Cameron  Date: 10/06/2018  MRN: [id number]  DOB: November 12, 1980  PCP: [name], AGNP    Patient Outreach Telephone Encounter    Provider attests that he/she is currently located in [place] ; Patient confirms to be located in [place].    Patient consents to telephone visit.    50 min was spent on this medically necessary service. Pt visit by phone in the setting of State of Emergency due to COVID-19 Pandemic.   Assessment/Plan:   This is a 38 y.o. male with a PMH notable for Pyoderma Gangrenosum, UC, Depression, HLD who presents for ulcerative colitis.   He was diagnosed with UC in 2003 and treated with Asacol for ~3-4 years, quickly achieving remission. He has remained in symptomatic remission since this time.  About 2 years ago he was diagnosed with pyoderma gangrenosum and was on steroids and abx for about a year. More recently his dermatologist has considered starting Humira for PG. Since he has not had a colonoscopy since his initial diagnostic one, he was referred to GI for consideration of colonoscopy to determine if UC may be contributing to his clinical picture.   Will plan to evaluate with colonoscopy. If colonoscopy is unremarkable, then I will plan to see him back in 1 year and repeat colonoscopy in 5 years for CRC screening in high risk patient. If it shows any signs of disease activity, will work with derm to get Humira approved for both indications and then repeat colonoscopy in 6-12 months after initiating humira to ensure mucosal healing.    Plan:  - colonoscopy ordered.        [name] will be seen for follow-up visit in 1 year (sooner if colonoscopy shows any disease activity) and will call or message provider should anything change or any new issues arise.    Patient's care discussed with Dr Erma Heritage  Subjective:     Chief Complaint: Andrew Cameron presents for this telephone encounter reporting Ulcerative colitis    History of Present Illness: This is a 38 y.o. male with a PMH notable for Pyoderma Gangrenosum, UC, Depression, HLD who presents for ulcerative colitis.     Reports being diagnosed with UC in 2003 after presenting with crampy abdominal pain and loose stools. He had a colonoscopy that was consistent with UC. He does not remember details regarding which part of his colon was involved. He was treated with asacol for ~3-4 years and it effectively lead to remission. He has now been off asacol for over 10 years. Currently he has no abdominal pain, blood stool, or diarrhea. BM 1-2 x per day. No urgency. Weight is stable.    He was diagnosed with PG about 2 years ago. Was treated with prednisone for almost a year. His dermatologist has been trying to get humira approved for him and wanted to determine if he has any active UC that could be contributing to his clinical picture.       PMH: UC, PG, depression, HLD, obesity, lactose intolerance  PSH: rhinoplasty, sinus surgery, wrist surgery for fracture  FMH: MGF had UC.   SH: former smoker (quit in Jan 2020), no ETOH, no drugs. Works as [employer] for a [employer]. Has 7 kids with another on the way.       Past Medical History:   Diagnosis Date   ??? Diabetes mellitus (CMS-HCC)         Past Surgical History:   Procedure Laterality Date   ??? SKIN BIOPSY  Family History   Problem Relation Age of Onset   ??? Melanoma Neg Hx    ??? Basal cell carcinoma Neg Hx    ??? Squamous cell carcinoma Neg Hx        Social History     Socioeconomic History   ??? Marital status: Married     Spouse name: None   ??? Number of children: None   ??? Years of education: None   ??? Highest education level: None   Occupational History   ??? None   Social Needs   ??? Financial resource strain: None   ??? Food insecurity     Worry: None     Inability: None   ??? Transportation needs     Medical: None     Non-medical: None   Tobacco Use   ??? Smoking status: Former Smoker     Last attempt to quit: 05/28/2018     Years since quitting: 0.3   ??? Smokeless tobacco: Never Used   Substance and Sexual Activity   ??? Alcohol use: Yes   ??? Drug use: None   ??? Sexual activity: None   Lifestyle   ??? Physical activity     Days per week: None     Minutes per session: None   ??? Stress: None   Relationships   ??? Social Wellsite geologist on phone: None     Gets together: None     Attends religious service: None     Active member of club or organization: None     Attends meetings of clubs or organizations: None     Relationship status: None   Other Topics Concern   ??? Do you use sunscreen? No   ??? Tanning bed use? Yes   ??? Are you easily burned? No   ??? Excessive sun exposure? No   ??? Blistering sunburns? No   Social History Narrative   ??? None          Current Outpatient Medications:   ???  acetaminophen (TYLENOL) 500 MG tablet, Take 1,000 mg by mouth., Disp: , Rfl:   ???  cetirizine (ZYRTEC) 10 MG tablet, Take 10 mg by mouth., Disp: , Rfl:   ???  CHANTIX STARTING MONTH BOX 0.5 mg (11)- 1 mg (42) tablet, TAKE 0.5 MG TABLET BY MOUTH ONCE DAILY FOR 3 DAYS THEN INCREASE TO 0.5 MG TWICE DAILY DAILY FOR 4 DAYS THEN INCREASE TO 1 MG TWICE DAILY THE, Disp: , Rfl:   ???  dapsone 7.5 % GlwP, Apply 1 application topically two (2) times a day., Disp: 1 Bottle, Rfl: 0  ???  doxycycline (VIBRA-TABS) 100 MG tablet/capsule, Take 1 each (100 mg total) by mouth Two (2) times a day., Disp: 60 each, Rfl: 2  ???  fluticasone (FLONASE) 50 mcg/actuation nasal spray, , Disp: , Rfl:   ???  hydrocortisone 2.5 % cream, Apply 1 application topically Two (2) times a day. To face for itch, Disp: 453.6 g, Rfl: 1  ???  ibuprofen (ADVIL,MOTRIN) 200 MG tablet, Take 400-600 mg by mouth., Disp: , Rfl:   ???  metFORMIN (GLUCOPHAGE) 1000 MG tablet, Take 1,000 mg by mouth two (2) times a day., Disp: , Rfl:   ???  metFORMIN (GLUCOPHAGE) 500 MG tablet, Take 500 mg by mouth., Disp: , Rfl:   ???  mupirocin (BACTROBAN) 2 % ointment, Apply topically Three (3) times a day. To affected area till healed, Disp: 15 g, Rfl: 1  ???  mupirocin (BACTROBAN)  2 % ointment, Apply topically Two (2) times a day. Mix with clobetasol and apply twice daily, Disp: 60 g, Rfl: 2  ???  ranitidine (ZANTAC) 150 MG tablet, Take 150 mg by mouth., Disp: , Rfl:   ???  SANTYL ointment, , Disp: , Rfl:   ???  venlafaxine (EFFEXOR-XR) 150 MG 24 hr capsule, Take 150 mg by mouth., Disp: , Rfl:   ???  adalimumab (HUMIRA) 40 mg/0.8 mL subcutaneous pen kit, Inject 2 pens (80 mg) under the skin for the first dose then inject 1 pen (40 mg) every 2 weeks (Patient not taking: Reported on 07/08/2018), Disp: 6 each, Rfl: 6     Objective:   (Physical Examination (to extent possible); Laboratory and Test Data (as available))    Vital signs: Height 5'11   Weight 352Lb    Constitutional: Appears well, no apparent distress  HENT: Head: Normocephalic, atraumatic               Neck: no visible thyroid enlargement  Eyes: Extraocular movements intact  Pulmonary: Normal respiratory effort  Cardiovascular: No visible edema  Musculoskeletal: Appears normal, no swelling or deformity  Skin: Normal color and no visible rash  Neurologic: Alert and oriented x3, cranial nerves grossly intact  Psychiatric: normal mood, affect and behaviour      I spent 25 minutes on the audio/video with the patient. I spent an additional 25 minutes on pre- and post-visit activities.     The patient was physically located in [place] or a state in which I am permitted to provide care. The patient and/or parent/gauardian understood that s/he may incur co-pays and cost sharing, and agreed to the telemedicine visit. The visit was completed via phone and/or video, which was appropriate and reasonable under the circumstances given the patient's presentation at the time.    The patient and/or parent/guardian has been advised of the potential risks and limitations of this mode of treatment (including, but not limited to, the absence of in-person examination) and has agreed to be treated using telemedicine. The patient's/patient's family's questions regarding telemedicine have been answered. If the phone/video visit was completed in an ambulatory setting, the patient and/or parent/guardian has also been advised to contact their provider???s office for worsening conditions, and seek emergency medical treatment and/or call 911 if the patient deems either necessary.

## 2018-10-06 NOTE — Progress Notes (Addendum)
KERY, BATZEL ([id number]) Visit Report for 10/06/2018 Arrival Information Details Patient Name: [name], [name] Date of Service: 10/06/2018 3:30 PM Medical Record Number: [id number] Patient Account Number: [id number] Date of Birth/Sex: [birth year] (38 y.o. M) Treating RN: [name] Primary Care Leilanee Righetti: [name] Other Clinician: Referring Darthula Desa: [name] Treating Daurice Ovando/Extender: Melburn Hake, [name]: 95 Visit Information History Since Last Visit Added or deleted any medications: No Patient Arrived: Ambulatory Any new allergies or adverse reactions: No Arrival Time: 15:46 Had a fall or experienced change in No Accompanied By: self activities of daily living that may affect Transfer Assistance: None risk of falls: Patient Identification Verified: Yes Signs or symptoms of abuse/neglect since last visito No Secondary Verification Process Completed: Yes Hospitalized since last visit: No Patient Requires Transmission-Based No Implantable device outside of the clinic excluding No Precautions: cellular tissue based products placed in the center Patient Has Alerts: No since last visit: Has Dressing in Place as Prescribed: Yes Pain Present Now: No Electronic Signature(s) Signed: 10/06/2018 4:47:44 PM By: [name], [name], RN, [name], [name], [name] Entered By: [name], [name], RN, [name], [name] on 10/06/2018 15:46:28 [name] ([id number]) -------------------------------------------------------------------------------- Clinic Level of Care Assessment Details Patient Name: [name] Date of Service: 10/06/2018 3:30 PM Medical Record Number: [id number] Patient Account Number: [id number] Date of Birth/Sex: [birth year] (37 y.o. M) Treating RN: [name] Primary Care Aaliah Jorgenson: [name] Other Clinician: Referring Joanthan Hlavacek: [name] Treating Tacara Hadlock/Extender: Melburn Hake, [name]: 95 Clinic Level of Care Assessment Items TOOL 4 Quantity  Score []  - Use when only an EandM is performed on FOLLOW-UP visit 0 ASSESSMENTS - Nursing Assessment / Reassessment []  - Reassessment of Co-morbidities (includes updates in patient status) 0 X- 1 5 Reassessment of Adherence to Treatment Plan ASSESSMENTS - Wound and Skin Assessment / Reassessment X - Simple Wound Assessment / Reassessment - one wound 1 5 []  - 0 Complex Wound Assessment / Reassessment - multiple wounds []  - 0 Dermatologic / Skin Assessment (not related to wound area) ASSESSMENTS - Focused Assessment []  - Circumferential Edema Measurements - multi extremities 0 []  - 0 Nutritional Assessment / Counseling / Intervention []  - 0 Lower Extremity Assessment (monofilament, tuning fork, pulses) []  - 0 Peripheral Arterial Disease Assessment (using hand held doppler) ASSESSMENTS - Ostomy and/or Continence Assessment and Care []  - Incontinence Assessment and Management 0 []  - 0 Ostomy Care Assessment and Management (repouching, etc.) PROCESS - Coordination of Care X - Simple Patient / Family Education for ongoing care 1 15 []  - 0 Complex (extensive) Patient / Family Education for ongoing care []  - 0 Staff obtains Programmer, systems, Records, Test Results / Process Orders []  - 0 Staff telephones HHA, Nursing Homes / Clarify orders / etc []  - 0 Routine Transfer to another Facility (non-emergent condition) []  - 0 Routine Hospital Admission (non-emergent condition) []  - 0 New Admissions / Biomedical engineer / Ordering NPWT, Apligraf, etc. []  - 0 Emergency Hospital Admission (emergent condition) X- 1 10 Simple Discharge Coordination []  - 0 Complex (extensive) Discharge Coordination PROCESS - Special Needs []  - Pediatric / Minor Patient Management 0 []  - 0 Isolation Patient Management [name], [name] ([id number]) []  - 0 Hearing / Language / Visual special needs []  - 0 Assessment of Community assistance (transportation, D/C planning, etc.) []  - 0 Additional assistance /  Altered mentation []  - 0 Support Surface(s) Assessment (bed, cushion, seat, etc.) INTERVENTIONS - Wound Cleansing / Measurement X - Simple Wound Cleansing - one wound 1 5 []  - 0  Complex Wound Cleansing - multiple wounds X- 1 5 Wound Imaging (photographs - any number of wounds) []  - 0 Wound Tracing (instead of photographs) X- 1 5 Simple Wound Measurement - one wound []  - 0 Complex Wound Measurement - multiple wounds INTERVENTIONS - Wound Dressings []  - Small Wound Dressing one or multiple wounds 0 X- 1 15 Medium Wound Dressing one or multiple wounds []  - 0 Large Wound Dressing one or multiple wounds []  - 0 Application of Medications - topical []  - 0 Application of Medications - injection INTERVENTIONS - Miscellaneous []  - External ear exam 0 []  - 0 Specimen Collection (cultures, biopsies, blood, body fluids, etc.) []  - 0 Specimen(s) / Culture(s) sent or taken to Lab for analysis []  - 0 Patient Transfer (multiple staff / Civil Service fast streamer / Similar devices) []  - 0 Simple Staple / Suture removal (25 or less) []  - 0 Complex Staple / Suture removal (26 or more) []  - 0 Hypo / Hyperglycemic Management (close monitor of Blood Glucose) []  - 0 Ankle / Brachial Index (ABI) - do not check if billed separately X- 1 5 Vital Signs Has the patient been seen at the hospital within the last three years: Yes Total Score: 70 Level Of Care: New/Established - Level 2 Electronic Signature(s) Signed: 10/06/2018 4:47:44 PM By: [name], [name], RN, [name], [name], [name] Entered By: [name], [name], RN, [name], [name] on 10/06/2018 16:28:11 [name] ([id number]) -------------------------------------------------------------------------------- Encounter Discharge Information Details Patient Name: [name] Date of Service: 10/06/2018 3:30 PM Medical Record Number: [id number] Patient Account Number: [id number] Date of Birth/Sex: 07-09-1980 (37 y.o. M) Treating RN: [name] Primary Care Forrester Blando: [name]  Other Clinician: Referring Billyjack Trompeter: [name] Treating Janesa Dockery/Extender: Melburn Hake, [name]: 58 Encounter Discharge Information Items Discharge Condition: Stable Ambulatory Status: Ambulatory Discharge Destination: Home Transportation: Private Auto Accompanied By: self Schedule Follow-up Appointment: Yes Clinical Summary of Care: Electronic Signature(s) Signed: 10/06/2018 4:47:44 PM By: [name], [name], RN, [name], [name], [name] Entered By: [name], [name], RN, [name], [name] on 10/06/2018 16:29:40 [name] ([id number]) -------------------------------------------------------------------------------- Lower Extremity Assessment Details Patient Name: [name] Date of Service: 10/06/2018 3:30 PM Medical Record Number: [id number] Patient Account Number: [id number] Date of Birth/Sex: 08/19/1980 (37 y.o. M) Treating RN: [name] Primary Care Leilanni Halvorson: [name] Other Clinician: Referring Jillane Po: [name] Treating Demeshia Sherburne/Extender: Melburn Hake, [name]: 95 Edema Assessment Assessed: [Left: No] [Right: No] Edema: [Left: Ye] [Right: s] Vascular Assessment Pulses: Dorsalis Pedis Palpable: [Left:Yes] Electronic Signature(s) Signed: 10/06/2018 4:47:44 PM By: [name], [name], RN, [name], [name], [name] Entered By: [name], [name], RN, [name], [name] on 10/06/2018 15:51:23 [name] ([id number]) -------------------------------------------------------------------------------- Multi Wound Chart Details Patient Name: [name] Date of Service: 10/06/2018 3:30 PM Medical Record Number: [id number] Patient Account Number: [id number] Date of Birth/Sex: [birth year] (37 y.o. M) Treating RN: [name] Primary Care Saddie Sandeen: [name] Other Clinician: Referring Danyah Guastella: [name] Treating Dorrance Sellick/Extender: Melburn Hake, [name]: 95 Vital Signs Height(in): 71 Pulse(bpm): 77 Weight(lbs): 338 Blood Pressure(mmHg): 131/77 Body Mass  Index(BMI): 47 Temperature(F): 98.5 Respiratory Rate 16 (breaths/min): Photos: [N/A:N/A] Wound Location: Left Lower Leg - Lateral N/A N/A Wounding Event: Gradually Appeared N/A N/A Primary Etiology: Pyoderma N/A N/A Comorbid History: Sleep Apnea, Hypertension, N/A N/A Colitis Date Acquired: 11/18/2015 N/A N/A Weeks of Treatment: 95 N/A N/A Wound Status: Open N/A N/A Measurements L x W x D 2.5x2.7x0.2 N/A N/A (cm) Area (cm) : 5.301 N/A N/A Volume (cm) : 1.06 N/A N/A % Reduction in  Area: -8.00% N/A N/A % Reduction in Volume: 73.00% N/A N/A Classification: Full Thickness With Exposed N/A N/A Support Structures Exudate Amount: Medium N/A N/A Exudate Type: Serous N/A N/A Exudate Color: amber N/A N/A Wound Margin: Distinct, outline attached N/A N/A Granulation Amount: None Present (0%) N/A N/A Necrotic Amount: Large (67-100%) N/A N/A Exposed Structures: Fat Layer (Subcutaneous N/A N/A Tissue) Exposed: Yes Fascia: No Tendon: No Muscle: No Joint: No Bone: No Epithelialization: Small (1-33%) N/A N/A Treatment Notes Electronic Signature(s) Signed: 10/06/2018 4:47:44 PM By: [name], [name], RN, [name], [name], [name] [place], [place] ([id number]) Entered By: [name], [name], RN, [name], [name] on 10/06/2018 16:20:09 [name] ([id number]) -------------------------------------------------------------------------------- Multi-Disciplinary Care Plan Details Patient Name: [name] Date of Service: 10/06/2018 3:30 PM Medical Record Number: [id number] Patient Account Number: [id number] Date of Birth/Sex: 1980-12-31 (37 y.o. M) Treating RN: [name] Primary Care Michial Disney: [name] Other Clinician: Referring Aidaly Cordner: [name] Treating Story Vanvranken/Extender: Melburn Hake, [name]: 71 Active Inactive Orientation to the Wound Care Program Nursing Diagnoses: Knowledge deficit related to the wound healing center program Goals: Patient/caregiver will verbalize understanding  of the [facility] Program Date Initiated: 12/11/2016 Target Resolution Date: 03/13/2017 Goal Status: Active Interventions: Provide education on orientation to the wound center Notes: Venous Leg Ulcer Nursing Diagnoses: Knowledge deficit related to disease process and management Potential for venous Insuffiency (use before diagnosis confirmed) Goals: Non-invasive venous studies are completed as ordered Date Initiated: 12/11/2016 Target Resolution Date: 03/13/2017 Goal Status: Active Patient will maintain optimal edema control Date Initiated: 12/11/2016 Target Resolution Date: 03/13/2017 Goal Status: Active Patient/caregiver will verbalize understanding of disease process and disease management Date Initiated: 12/11/2016 Target Resolution Date: 03/13/2017 Goal Status: Active Verify adequate tissue perfusion prior to therapeutic compression application Date Initiated: 12/11/2016 Target Resolution Date: 03/13/2017 Goal Status: Active Interventions: Assess peripheral edema status every visit. Compression as ordered Provide education on venous insufficiency Treatment Activities: Therapeutic compression applied : 12/11/2016 Notes: Wound/Skin Impairment Nursing Diagnoses: Impaired tissue integrity [name], [name] ([id number]) Knowledge deficit related to ulceration/compromised skin integrity Goals: Patient/caregiver will verbalize understanding of skin care regimen Date Initiated: 12/11/2016 Target Resolution Date: 03/13/2017 Goal Status: Active Ulcer/skin breakdown will have a volume reduction of 30% by week 4 Date Initiated: 12/11/2016 Target Resolution Date: 03/13/2017 Goal Status: Active Ulcer/skin breakdown will have a volume reduction of 50% by week 8 Date Initiated: 12/11/2016 Target Resolution Date: 03/13/2017 Goal Status: Active Ulcer/skin breakdown will have a volume reduction of 80% by week 12 Date Initiated: 12/11/2016 Target Resolution Date: 03/13/2017 Goal  Status: Active Ulcer/skin breakdown will heal within 14 weeks Date Initiated: 12/11/2016 Target Resolution Date: 03/13/2017 Goal Status: Active Interventions: Assess patient/caregiver ability to obtain necessary supplies Assess patient/caregiver ability to perform ulcer/skin care regimen upon admission and as needed Assess ulceration(s) every visit Provide education on ulcer and skin care Treatment Activities: Skin care regimen initiated : 12/11/2016 Topical wound management initiated : 12/11/2016 Notes: Electronic Signature(s) Signed: 10/06/2018 4:47:44 PM By: [name], [name], RN, [name], [name], [name] Entered By: [name], [name], RN, [name], [name] on 10/06/2018 16:19:58 [name] ([id number]) -------------------------------------------------------------------------------- Pain Assessment Details Patient Name: [name] Date of Service: 10/06/2018 3:30 PM Medical Record Number: [id number] Patient Account Number: [id number] Date of Birth/Sex: Nov 03, 1980 (37 y.o. M) Treating RN: [name] Primary Care Cressie Betzler: [name] Other Clinician: Referring Dorothe Elmore: [name] Treating Levada Bowersox/Extender: Melburn Hake, [name]: 95 Active Problems Location of Pain Severity and Description of Pain Patient Has Paino No Site Locations Pain Management and  Medication Current Pain Management: Notes Patient denies pain at this time. Electronic Signature(s) Signed: 10/06/2018 4:47:44 PM By: [name], [name], RN, [name], [name], [name] Entered By: [name], [name], RN, [name], [name] on 10/06/2018 15:46:47 [name], [name] ([id number]) -------------------------------------------------------------------------------- Patient/Caregiver Education Details Patient Name: [name] Date of Service: 10/06/2018 3:30 PM Medical Record Number: [id number] Patient Account Number: [id number] Date of Birth/Gender: 1980-09-04 (37 y.o. M) Treating RN: [name] Primary Care Physician: [name] Other Clinician: Referring  Physician: [name] Treating Physician/Extender: Sharalyn Ink in Treatment: 70 Education Assessment Education Provided To: Patient Education Topics Provided Wound/Skin Impairment: Handouts: Caring for Your Ulcer Methods: Demonstration, Explain/Verbal Responses: State content correctly Electronic Signature(s) Signed: 10/06/2018 4:47:44 PM By: [name], [name], RN, [name], [name], [name] Entered By: [name], [name], RN, [name], [name] on 10/06/2018 16:28:30 [name] ([id number]) -------------------------------------------------------------------------------- Wound Assessment Details Patient Name: [name] Date of Service: 10/06/2018 3:30 PM Medical Record Number: [id number] Patient Account Number: [id number] Date of Birth/Sex: 01-16-1981 (37 y.o. M) Treating RN: [name] Primary Care Juvia Aerts: [name] Other Clinician: Referring Tariyah Pendry: [name] Treating Aloise Copus/Extender: Melburn Hake, [name]: 95 Wound Status Wound Number: 1 Primary Etiology: Pyoderma Wound Location: Left Lower Leg - Lateral Wound Status: Open Wounding Event: Gradually Appeared Comorbid History: Sleep Apnea, Hypertension, Colitis Date Acquired: 11/18/2015 Weeks Of Treatment: 95 Clustered Wound: No Photos Wound Measurements Length: (cm) 2.5 % Redu Width: (cm) 2.7 % Redu Depth: (cm) 0.2 Epithe Area: (cm) 5.301 [place] Volume: (cm) 1.06 Under ction in Area: -8% ction in Volume: 73% lialization: Small (1-33%) ling: No mining: No Wound Description Full Thickness With Exposed Support Classification: Structures Wound Margin: Distinct, outline attached Exudate Medium Amount: Exudate Type: Serous Exudate Color: amber Foul Odor After Cleansing: No Slough/Fibrino Yes Wound Bed Granulation Amount: None Present (0%) Exposed Structure Necrotic Amount: Large (67-100%) Fascia Exposed: No Necrotic Quality: Adherent Slough Fat Layer (Subcutaneous Tissue) Exposed:  Yes Tendon Exposed: No Muscle Exposed: No Joint Exposed: No Bone Exposed: No Treatment Notes Wound #1 (Left, Lateral Lower Leg) Notes iodoflex in clinic, xsorb, secured with tape Electronic Signature(s) Signed: 10/06/2018 4:47:44 PM By: [name], [name], RN, [name], [name], [name] [place], [place] ([id number]) Entered By: [name], [name], RN, [name], [name] on 10/06/2018 15:51:02 [name] ([id number]) -------------------------------------------------------------------------------- [place] Details Patient Name: [name] Date of Service: 10/06/2018 3:30 PM Medical Record Number: [id number] Patient Account Number: [id number] Date of Birth/Sex: 1981-03-23 (37 y.o. M) Treating RN: [name] Primary Care Excell Neyland: [name] Other Clinician: Referring Elim Economou: [name] Treating Desarie Feild/Extender: Melburn Hake, [name]: 95 Vital Signs Time Taken: 15:46 Temperature (F): 98.5 Height (in): 71 Pulse (bpm): 77 Weight (lbs): 338 Respiratory Rate (breaths/min): 16 Body Mass Index (BMI): 47.1 Blood Pressure (mmHg): 131/77 Reference Range: 80 - 120 mg / dl Electronic Signature(s) Signed: 10/06/2018 4:47:44 PM By: [name], [name], RN, [name], [name], [name] Entered By: [name], [name], RN, [name], [name] on 10/06/2018 15:47:06

## 2018-10-07 NOTE — Progress Notes (Signed)
[name], [name] ([id number]) Visit Report for 10/06/2018 Chief Complaint Document Details Patient Name: [name], [name] Date of Service: 10/06/2018 3:30 PM Medical Record Number: [id number] Patient Account Number: [id number] Date of Birth/Sex: 21-Mar-1981 (38 y.o. M) Treating RN: Harold Barban Primary Care Provider: [name] Other Clinician: Referring Provider: [name] Treating Provider/Extender: Melburn Hake, Jadalee Westcott Weeks in Treatment: 95 Information Obtained from: Patient Chief Complaint He is here in follow up evaluation for LLE pyoderma ulcer Electronic Signature(s) Signed: 10/06/2018 11:00:46 PM By: Worthy Keeler PA-C Entered By: Worthy Keeler on 10/06/2018 22:52:41 [name], [name] ([id number]) -------------------------------------------------------------------------------- HPI Details Patient Name: [name] Date of Service: 10/06/2018 3:30 PM Medical Record Number: [id number] Patient Account Number: [id number] Date of Birth/Sex: 1980-12-01 (37 y.o. M) Treating RN: Harold Barban Primary Care Provider: [name] Other Clinician: Referring Provider: [name] Treating Provider/Extender: Melburn Hake, Milayna Rotenberg Weeks in Treatment: 95 History of Present Illness HPI Description: 12/04/16; 38 year old man who comes into the clinic today for review of a wound on the posterior left calf. He tells me that is been there for about a year. He is not a diabetic he does smoke half a pack per day. He was seen in the ER on 11/20/16 felt to have cellulitis around the wound and was given clindamycin. An x-ray did not show osteomyelitis. The patient initially tells me that he has a milk allergy that sets off a pruritic itching rash on his lower legs which she scratches incessantly and he thinks that's what may have set up the wound. He has been using various topical antibiotics and ointments without any effect. He works in a [employer] and is on his feet all day. He does not have a  prior history of wounds however he does have the rash on both lower legs the right arm and the ventral aspect of his left arm. These are excoriations and clearly have had scratching however there are of macular looking areas on both legs including a substantial larger area on the right leg. This does not have an underlying open area. There is no blistering. The patient tells me that 2 years ago in [place] in response to the rash on his legs he saw a dermatologist who told him he had a condition which may be pyoderma gangrenosum although I may be putting words into his mouth. He seemed to recognize this. 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 He is not in any treatment for this. He's had no recent travel 12/11/16; the patient arrives today with his wound and roughly the same condition we've been using silver alginate this is a deep punched out wound with some surrounding erythema but no tenderness. Biopsy I did did not show confirmed pyoderma gangrenosum suggested nonspecific inflammation and vasculitis but does not provide an actual description of what was seen by the pathologist. I'm really not able to understand this We have also received information from the patient's dermatologist in [place] notes from April 2016. This was a doctor Agarwal- antal. 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 He was prescribed topical steroid high potency under occlusion which helped but at this point the patient did not have a deep punched out wound. 12/18/16; the patient's wound is larger in terms of surface area however this surface looks better and there is less depth. The surrounding erythema also is better. The patient states that the wrap we put on came off 2 days ago when he has been using  his compression stockings. He we are in the process of getting a dermatology consult. 12/26/16 on evaluation today patient's left lower extremity wound shows evidence  of infection with surrounding erythema noted. He has been tolerating the dressing changes but states that he has noted more discomfort. There is a larger area of erythema surrounding the wound. No fevers, chills, nausea, or vomiting noted at this time. With that being said the wound still does have slough covering the surface. He is not allergic to any medication that he is aware of at this point. In regard to his right lower extremity he had several regions that are erythematous and pruritic he wonders if there's anything we can do to help that. 01/02/17 I reviewed patient's wound culture which was obtained his visit last week. He was placed on doxycycline at that point. Unfortunately that does not appear to be an antibiotic that would likely help with the situation however the pseudomonas noted on culture is sensitive to Cipro. Also unfortunately patient's wound seems to have a large compared to last week's evaluation. Not severely so but there are definitely increased measurements in general. He is continuing to have discomfort as well he writes this to be a seven out of 10. In fact he would prefer me not to perform any debridement today due to the fact that he is having discomfort and considering he has an active infection on the little reluctant to do so anyway. No fevers, chills, nausea, or vomiting noted at this time. 01/08/17; patient seems dermatology on September 5. I suspect dermatology will want the slides from the biopsy I did sent to their pathologist. I'm not sure if there is a way we can expedite that. In any case the culture I did before I left on vacation 3 weeks ago showed Pseudomonas he was given 10 days of Cipro and per her description of her intake nurses is actually somewhat better this week although the wound is quite a bit bigger than I remember the last time I saw this. He still has 3 more days of Cipro 01/21/17; dermatology appointment tomorrow. He has completed the  ciprofloxacin for Pseudomonas. Surface of the wound looks better however he is had some deterioration in the lesions on his right leg. Meantime the left lateral leg wound we will continue with sample 01/29/17; patient had his dermatology appointment but I can't yet see that note. He is completed his antibiotics. The wound is more superficial but considerably larger in circumferential area than when he came in. This is in his left lateral calf. He also has swollen erythematous areas with superficial wounds on the right leg and small papular areas on both arms. There apparently areas in her his upper thighs and buttocks I did not look at those. Dermatology biopsied the right leg. Hopefully will have their input next week. [name], [name] ([id number]) 02/05/17; patient went back to see his dermatologist who told him that he had a "scratching problem" as well as staph. He is now on a 30 day course of doxycycline and I believe she gave him triamcinolone cream to the right leg areas to help with the itching [not exactly sure but probably triamcinolone]. She apparently looked at the left lateral leg wound although this was not rebiopsied and I think felt to be ultimately part of the same pathogenesis. He is using sample border foam and changing nevus himself. He now has a new open area on the right posterior leg which was his biopsy site I don't  have any of the dermatology notes 02/12/17; we put the patient in compression last week with SANTYL to the wound on the left leg and the biopsy. Edema is much better and the depth of the wound is now at level of skin. Area is still the same oBiopsy site on the right lateral leg we've also been using santyl with a border foam dressing and he is changing this himself. 02/19/17; Using silver alginate started last week to both the substantial left leg wound and the biopsy site on the right wound. He is tolerating compression well. Has a an appointment with his primary M.D.  tomorrow wondering about diuretics although I'm wondering if the edema problem is actually lymphedema 02/26/17; the patient has been to see his primary doctor Dr. Jerrel Ivory at [facility] [facility] our primary care. She started him on Lasix 20 mg and this seems to have helped with the edema. However we are not making substantial change with the left lateral calf wound and inflammation. The biopsy site on the right leg also looks stable but not really all that different. 03/12/17; the patient has been to see vein and vascular Dr. Lucky Cowboy. He has had venous reflux studies I have not reviewed these. I did get a call from his dermatology office. 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 They therefore recommended clobetasol and tetracycline i.e. 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 03/26/17; apparently the patient just had reflux studies not an appointment with Dr. dew. She arrives in clinic today having applied clobetasol for 2-3 weeks. He notes over the last 2-3 days excessive drainage having to change the dressing 3-4 times a day and also expanding erythema. 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 Lillia Carmel at [facility] dermatology locally who kindly saw him at my request. 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 My concern was the degree of erythema medially extending from his left leg wound which was either  inflammation from pyoderma or cellulitis. I put him on Augmentin however culture of the wound showed Pseudomonas which is quinolone sensitive. I really don't believe he has cellulitis however in view of everything I will continue and give him a course of Cipro. He is also on doxycycline as an immune modulator for the pyoderma. In addition to his original wound on the left lateral leg with surrounding erythema he has a wound on the right posterior calf which was an original biopsy site done by dermatology. 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 Saw Dr. Lillia Carmel yesterday. He has been using topical antibiotics to both wound areas his original wound on the left and the biopsies/pathergy area on the right. There is definitely some improvement in the inflammation around the wound on the right although the patient states he has increasing sensitivity of the wounds. He is on prednisone 60 and doxycycline 1 as prescribed by Dr. Lillia Carmel. He is covering the topical  antibiotic with gauze and putting this in his own compression stocks and changing this daily. He states that Dr. Lottie Rater did a culture of the left leg wound yesterday 05/07/17; pyoderma gangrenosum. The patient saw Dr. Lillia Carmel yesterday and has a follow-up with her in one month. He is still using topical antibiotics to both wounds although he can't recall exactly what type. He is still on prednisone 60 mg. Dr. Lillia Carmel stated that the doxycycline could stop if we were in agreement. 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 The right medial leg was induced by biopsy/pathergy. The area on the right is essentially healed. 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 The right medial leg has  closed and remains closed. He is still on prednisone 60. oHe tells me he missed his last dermatology appointment with Dr. Lillia Carmel but will make another appointment. He reports that her blood sugar at a recent screen in [place] was high 200's. He was 180 today. 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 I think he is going to require still much longer prednisone perhaps another 3 months before attempting to taper. In the meantime his wound is a lot better. Smaller. He is cleaning this off daily and applying topical antibiotics. When he was last in the clinic I thought about changing to [facility] and actually put in a couple of calls to dermatology although probably not during their business hours. 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 He continues on oral prednisone. He has been using triple antibiotic ointment. There is surface debris and we will transition to [facility] and have him return in 2 weeks. He has lost 30 pounds since his last appointment with lifestyle modification. He may benefit from topical steroid cream for treatment this can be considered at a later date. 08/22/17 on evaluation today patient appears to actually be doing rather well in regard to his left lateral lower extremity ulcer. He has actually been managed by Dr. Dellia Nims most recently. Patient is currently on oral steroids at this time. This seems to have been of benefit for him. Nonetheless his last visit was actually with Leah on 08/06/17. Currently he is not utilizing any topical steroid creams although this could be of benefit as well. No fevers, chills, nausea, or vomiting noted at this time. 09/05/17 on evaluation today patient appears to be doing better in regard to his left lateral lower extremity ulcer. He has been tolerating the dressing changes without complication. He is using  Santyl with good effect. Overall I'm very pleased with how Coca, Aimee ([id number]) things are standing at this point. Patient likewise is happy that this is doing better. 09/19/17 on evaluation today patient actually appears to be doing rather well in regard to his left lateral lower extremity ulcer. 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 He's not having any significant pain. 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 He has been tolerating the Santyl without complication and in general I feel like he's making good  progress. 10/17/17 on evaluation today patient appears to be doing very well in regard to his left lateral lower surety ulcer. He has been tolerating the dressing changes without complication. There does not appear to be any evidence of infection he's alternating the Santyl and the triple antibiotic ointment every other day this seems to be doing well for him. 11/03/17 on evaluation today patient appears to be doing very well in regard to his left lateral lower extremity ulcer. He is been tolerating the dressing changes without complication which is good news. Fortunately there does not appear to be any evidence of infection which is also great news. Overall is doing excellent they are starting to taper down on the prednisone is down to 40 mg at this point it also started topical clobetasol for him. 11/17/17 on evaluation today patient appears to be doing well in regard to his left lateral lower surety ulcer. He's been tolerating the dressing changes without complication. He does note that he is having no pain, no excessive drainage or discharge, and overall he feels like things are going about how he would expect and hope they would. Overall he seems to have no evidence of infection at this time in my  opinion which is good news. 12/04/17-He is seen in follow-up evaluation for right lateral lower extremity ulcer. He has been applying topical steroid cream. Today's measurement show slight increase in size. Over the next 2 weeks we will transition to every other day Santyl and steroid cream. He has been encouraged to monitor for changes and notify clinic with any concerns 12/15/17 on evaluation today patient's left lateral motion the ulcer and fortunately is doing worse again at this point. This just since last week to this week has close to doubled in size according to the patient. I did not seeing last week's I do not have a visual to compare this to in our system was also down so we do not have all the charts and at this point. Nonetheless it does have me somewhat concerned in regard to the fact that again he was worried enough about it he has contact the dermatology that placed them back on the full strength, 50 mg a day of the prednisone that he was taken previous. He continues to alternate using clobetasol along with Santyl at this point. He is obviously somewhat frustrated. 12/22/17 on evaluation today patient appears to be doing a little worse compared to last evaluation. Unfortunately the wound is a little deeper and slightly larger than the last week's evaluation. With that being said he has made some progress in regard to the irritation surrounding at this time unfortunately despite that progress that's been made he still has a significant issue going on here. 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 For that reason I discussed with him today the possibility of perform a wound culture to ensure there's nothing overtly infected. 01/06/18 on evaluation today patient's wound is larger and deeper than previously evaluated. With that being said it  did appear that his wound was infected after my last evaluation with him. Subsequently I did end up prescribing a prescription for Bactrim DS which she has been taking and having no complication with. Fortunately there does not appear to be any evidence of infection at this point in time as far as anything spreading, no want to touch, and overall  I feel like things are showing signs of improvement. 01/13/18 on evaluation today patient appears to be even a little larger and deeper than last time. There still muscle exposed in the base of the wound. Nonetheless he does appear to be less erythematous I do believe inflammation is calming down also believe the infection looks like it's probably resolved at this time based on what I'm seeing. No fevers, chills, nausea, or vomiting noted at this time. 01/30/18 on evaluation today patient actually appears to visually look better for the most part. Unfortunately those visually this looks better he does seem to potentially have what may be an abscess in the muscle that has been noted in the central portion of the wound. This is the first time that I have noted what appears to be fluctuance in the central portion of the muscle. With that being said I'm somewhat more concerned about the fact that this might indicate an abscess formation at this location. I do believe that an ultrasound would be appropriate. This is likely something we need to try to do as soon as possible. He has been switch to mupirocin ointment and he is no longer using the steroid ointment as prescribed by dermatology he sees them again next week he's been decreased from 60 to 40 mg of prednisone. 03/09/18 on evaluation today patient actually appears to be doing a little better compared to last time I saw him. There's not as much erythema surrounding the wound itself. He I did review his most recent infectious disease note which was dated 02/24/18. He saw Dr. Michel Bickers in [place]. 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 Patient is now off of antibiotics and they are continually observing this. There seems to be no change in the past two weeks in my pinion based on what the patient says and what I see today compared to what Dr. Megan Salon likely saw two weeks ago. No fevers, chills, nausea, or vomiting noted at this time. 03/23/18 on evaluation today patient's wound actually appears to be showing signs of improvement which is good news. He is [name], [name] ([id number]) currently still on the Dapsone. He is also working on tapering the prednisone to get off of this and Dr. Lottie Rater is working with him in this regard. Nonetheless overall I feel like the wound is doing well it does appear based on the infectious disease note that I reviewed from Dr. 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 I did also review the results of his ultrasound of left lower extremity which revealed there was a dentist tissue in the base of the wound without an abscess noted. 04/06/18 on evaluation today the patient's left lateral lower extremity ulcer actually appears to be doing fairly well which is excellent news. There does not appear to be any evidence of infection at this time which is also great news. Overall he still does have a significantly large ulceration although little by little he seems to be making progress. He is down to 10 mg a day of the prednisone. 04/20/18 on evaluation today patient actually appears to be doing excellent at this time in regard to his left lower extremity ulcer. He's making signs of good progress unfortunately this is taking much longer than we would really like  to see but nonetheless he is making progress. Fortunately there does not appear to be any evidence of infection  at this time. No fevers, chills, nausea, or vomiting noted at this time. The patient has not been using the Santyl due to the cost he hadn't got in this field yet. He's mainly been using the antibiotic ointment topically. Subsequently he also tells me that he really has not been scrubbing in the shower I think this would be helpful again as I told him it doesn't have to be anything too aggressive to even make it believe just enough to keep it free of some of the loose slough and biofilm on the wound surface. 05/11/18 on evaluation today patient's wound appears to be making slow but sure progress in regard to the left lateral lower extremity ulcer. He is been tolerating the dressing changes without complication. Fortunately there does not appear to be any evidence of infection at this time. He is still just using triple antibiotic ointment along with clobetasol occasionally over the area. He never got the Santyl and really does not seem to intend to in my pinion. 06/01/18 on evaluation today patient appears to be doing a little better in regard to his left lateral lower extremity ulcer. He states that overall he does not feel like he is doing as well with the Dapsone as he did with the prednisone. Nonetheless he sees his dermatologist later today and is gonna talk to them about the possibility of going back on the prednisone. Overall again I believe that the wound would be better if you would utilize Santyl but he really does not seem to be interested in going back to the [place] at this point. He has been using triple antibiotic ointment. 06/15/18 on evaluation today patient's wound actually appears to be doing about the same at this point. Fortunately there is no signs of infection at this time. He has made slight improvements although he continues to not really want to clean the wound bed at this point. He states that he just doesn't mess with it he doesn't want to cause any problems with everything  else he has going on. He has been on medication, antibiotics as prescribed by his dermatologist, for a staff infection of his lower extremities which is really drying out now and looking much better he tells me. Fortunately there is no sign of overall infection. 06/29/18 on evaluation today patient appears to be doing well in regard to his left lateral lower surety ulcer all things considering. Fortunately his staff infection seems to be greatly improved compared to previous. He has no signs of infection and this is drying up quite nicely. He is still the doxycycline for this is no longer on cental, Dapsone, or any of the other medications. His dermatologist has recommended possibility of an infusion but right now he does not want to proceed with that. 07/13/18 on evaluation today patient appears to be doing about the same in regard to his left lateral lower surety ulcer. Fortunately there's no signs of infection at this time which is great news. Unfortunately he still builds up a significant amount of Slough/biofilm of the surface of the wound he still is not really cleaning this as he should be appropriately. Again I'm able to easily with saline and gauze remove the majority of this on the surface which if you would do this at home would likely be a dramatic improvement for him as far as getting the area to improve. Nonetheless  overall I still feel like he is making progress is just very slow. I think Santyl will be of benefit for him as well. Still he has not gotten this as of this point. 07/27/18 on evaluation today patient actually appears to be doing little worse in regards of the erythema around the periwound region of the wound he also tells me that he's been having more drainage currently compared to what he was experiencing last time I saw him. He states not quite as bad as what he had because this was infected previously but nonetheless is still appears to be doing poorly. Fortunately there  is no evidence of systemic infection at this point. The patient tells me that he is not going to be able to afford the Santyl. He is still waiting to hear about the infusion therapy with his dermatologist. Apparently she wants an updated colonoscopy first. 08/10/18 on evaluation today patient appears to be doing better in regard to his left lateral lower extremity ulcer. Fortunately he is showing signs of improvement in this regard he's actually been approved for Remicade infusion's as well although this has not been scheduled as of yet. Fortunately there's no signs of active infection at this time in regard to the wound although he is having some issues with infection of the right lower extremity is been seen as dermatologist for this. Fortunately they are definitely still working with him trying to keep things under control. 09/07/18 on evaluation today patient is actually doing rather well in regard to his left lateral lower extremity ulcer. He notes these actually having some hair grow back on his extremity which is something he has not seen in years. He also tells me that the pain is really not giving them any trouble at this time which is also good news overall she is very pleased with the progress he's using a combination of the mupirocin along with the probate is all mixed. 09/21/18 on evaluation today patient actually appears to be doing fairly well all things considered in regard to his looks from the [place], Louise ([id number]) ulcer. He's been tolerating the dressing changes without complication. Fortunately there's no signs of active infection at this time which is good news he is still on all antibiotics or prevention of the staff infection. He has been on prednisone for time although he states it is gonna contact his dermatologist and see if she put them on a short course due to some irritation that he has going on currently. Fortunately there's no evidence of any overall worsening this is  going very slow I think cental would be something that would be helpful for him although he states that $50 for tube is quite expensive. He therefore is not willing to get that at this point. 10/06/18 on evaluation today patient actually appears to be doing decently well in regard to his left lateral leg ulcer. He's been tolerating the dressing changes without complication. Fortunately there's no signs of active infection at this time. Overall I'm actually rather pleased with the progress he's making although it's slow he doesn't show any signs of infection and he does seem to be making some improvement. I do believe that he may need a switch up and dressings to try to help this to heal more appropriately and quickly. Electronic Signature(s) Signed: 10/06/2018 11:00:46 PM By: Worthy Keeler PA-C Entered By: Worthy Keeler on 10/06/2018 22:53:11 AZEKIEL, CREMER ([id number]) -------------------------------------------------------------------------------- Physical Exam Details Patient Name: [name] Date of Service: 10/06/2018 3:30  PM Medical Record Number: [id number] Patient Account Number: [id number] Date of Birth/Sex: [birth year] (37 y.o. M) Treating RN: Harold Barban Primary Care Provider: [name] Other Clinician: Referring Provider: [name] Treating Provider/Extender: Melburn Hake, Juvenal Umar Weeks in Treatment: 63 Constitutional Well-nourished and well-hydrated in no acute distress. Respiratory normal breathing without difficulty. clear to auscultation bilaterally. Cardiovascular regular rate and rhythm with normal S1, S2. Psychiatric this patient is able to make decisions and demonstrates good insight into disease process. Alert and Oriented x 3. pleasant and cooperative. 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 Nonetheless I think debridement is necessary. For that reason since cental have not been utilized up to this point due to cost I think we may want to try Iodoflex to see if this could be of benefit. The patient is in agreement with that plan. Electronic Signature(s) Signed: 10/06/2018 11:00:46 PM By: Worthy Keeler PA-C Entered By: Worthy Keeler on 10/06/2018 22:53:44 [name], [name] ([id number]) -------------------------------------------------------------------------------- Physician Orders Details Patient Name: [name] Date of Service: 10/06/2018 3:30 PM Medical Record Number: [id number] Patient Account Number: [id number] Date of Birth/Sex: 01/25/1981 (37 y.o. M) Treating RN: [name] Primary Care Provider: [name] Other Clinician: Referring Provider: [name] Treating Provider/Extender: Melburn Hake, Kelsa Jaworowski Weeks in Treatment: 13 Verbal / Phone Orders: No Diagnosis Coding Wound Cleansing Wound #1 Left,Lateral Lower Leg o Clean wound with Normal Saline. Anesthetic (add to Medication List) Wound #1 Left,Lateral Lower Leg o Topical Lidocaine 4% cream applied to wound bed prior to debridement (In Clinic Only). Primary Wound Dressing Wound #1 Left,Lateral Lower Leg o Santyl Ointment o Iodoflex - In clinic Secondary Dressing Wound #1 Left,Lateral Lower Leg o XtraSorb - secured with tape Dressing Change Frequency Wound #1 Left,Lateral Lower Leg o Change dressing every day. Follow-up Appointments Wound #1 Left,Lateral Lower Leg o Return Appointment in 2 weeks. Edema Control Wound #1 Left,Lateral Lower Leg o Elevate legs to the level of the heart and pump ankles as often as possible Medications-please add to medication list. Wound #1 Left,Lateral Lower Leg o Santyl Enzymatic Ointment Patient Medications Allergies: milk, Biaxin, seasonal Notifications Medication Indication Start End Santyl 10/06/2018 DOSE topical 250 unit/gram  ointment - ointment topical applied nickel thick to the wound bed and then cover with a dressing as directed Electronic Signature(s) Signed: 10/06/2018 10:54:22 PM By: Worthy Keeler PA-C Previous Signature: 10/06/2018 4:47:44 PM Version By: [name], [name], RN, [name], [name], [name] Entered By: Worthy Keeler on 10/06/2018 22:54:21 EDWEN, MCLESTER ([id number]) [name], [name] ([id number]) -------------------------------------------------------------------------------- Problem List Details Patient Name: [name] Date of Service: 10/06/2018 3:30 PM Medical Record Number: [id number] Patient Account Number: [id number] Date of Birth/Sex: Oct 11, 1980 (37 y.o. M) Treating RN: Harold Barban Primary Care Provider: [name] Other Clinician: Referring Provider: [name] Treating Provider/Extender: Melburn Hake, [place] Wherry Weeks in Treatment: 95 Active Problems ICD-10 Evaluated Encounter Code Description Active Date Today Diagnosis L97.222 Non-pressure chronic ulcer of left calf with fat layer exposed 12/04/2016 No Yes L88 Pyoderma gangrenosum 03/26/2017 No Yes I87.2 Venous insufficiency (chronic) (peripheral) 12/04/2016 No Yes Inactive Problems ICD-10 Code Description Active Date Inactive Date L97.213 Non-pressure chronic ulcer of right calf with necrosis of muscle 04/02/2017 04/02/2017 Resolved Problems Electronic Signature(s) Signed: 10/06/2018 11:00:46 PM By: Worthy Keeler PA-C Entered By: Worthy Keeler on 10/06/2018 22:52:35 [name], [name] ([id number]) --------------------------------------------------------------------------------  Progress Note Details Patient Name: [name], [name] Date of Service: 10/06/2018 3:30 PM Medical Record Number: [id number] Patient Account Number: [id number] Date of Birth/Sex: Oct 17, 1980 (37 y.o. M) Treating RN: Harold Barban Primary Care Provider: [name] Other Clinician: Referring Provider: [name] Treating Provider/Extender: Melburn Hake,  Shuntia Exton Weeks in Treatment: 95 Subjective Chief Complaint Information obtained from Patient He is here in follow up evaluation for LLE pyoderma ulcer History of Present Illness (HPI) 12/04/16; 38 year old man who comes into the clinic today for review of a wound on the posterior left calf. He tells me that is been there for about a year. He is not a diabetic he does smoke half a pack per day. He was seen in the ER on 11/20/16 felt to have cellulitis around the wound and was given clindamycin. An x-ray did not show osteomyelitis. The patient initially tells me that he has a milk allergy that sets off a pruritic itching rash on his lower legs which she scratches incessantly and he thinks that's what may have set up the wound. He has been using various topical antibiotics and ointments without any effect. He works in a [employer] and is on his feet all day. He does not have a prior history of wounds however he does have the rash on both lower legs the right arm and the ventral aspect of his left arm. These are excoriations and clearly have had scratching however there are of macular looking areas on both legs including a substantial larger area on the right leg. This does not have an underlying open area. There is no blistering. The patient tells me that 2 years ago in [place] in response to the rash on his legs he saw a dermatologist who told him he had a condition which may be pyoderma gangrenosum although I may be putting words into his mouth. He seemed to recognize this. 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 He is not in any treatment for this. He's had no recent travel 12/11/16; the patient arrives today with his wound and roughly the same condition we've been using silver alginate this is a deep punched out wound with some surrounding erythema but no tenderness. Biopsy I did did not show confirmed pyoderma gangrenosum suggested nonspecific  inflammation and vasculitis but does not provide an actual description of what was seen by the pathologist. I'm really not able to understand this We have also received information from the patient's dermatologist in [place] notes from April 2016. This was a doctor Agarwal- antal. 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 He was prescribed topical steroid high potency under occlusion which helped but at this point the patient did not have a deep punched out wound. 12/18/16; the patient's wound is larger in terms of surface area however this surface looks better and there is less depth. The surrounding erythema also is better. The patient states that the wrap we put on came off 2 days ago when he has been using his compression stockings. He we are in the process of getting a dermatology consult. 12/26/16 on evaluation today patient's left lower extremity wound shows evidence of infection with surrounding erythema noted. He has been tolerating the dressing changes but states that he has noted more discomfort. There is a larger area of erythema surrounding the wound. No fevers, chills, nausea, or vomiting noted at this time. With that being said the wound still does have slough covering the surface. He is  not allergic to any medication that he is aware of at this point. In regard to his right lower extremity he had several regions that are erythematous and pruritic he wonders if there's anything we can do to help that. 01/02/17 I reviewed patient's wound culture which was obtained his visit last week. He was placed on doxycycline at that point. Unfortunately that does not appear to be an antibiotic that would likely help with the situation however the pseudomonas noted on culture is sensitive to Cipro. Also unfortunately patient's wound seems to have a large compared to last week's evaluation. Not severely so but there are definitely increased measurements in general. He is continuing to have  discomfort as well he writes this to be a seven out of 10. In fact he would prefer me not to perform any debridement today due to the fact that he is having discomfort and considering he has an active infection on the little reluctant to do so anyway. No fevers, chills, nausea, or vomiting noted at this time. 01/08/17; patient seems dermatology on September 5. I suspect dermatology will want the slides from the biopsy I did sent to their pathologist. I'm not sure if there is a way we can expedite that. In any case the culture I did before I left on vacation 3 weeks ago showed Pseudomonas he was given 10 days of Cipro and per her description of her intake nurses is actually somewhat better this week although the wound is quite a bit bigger than I remember the last time I saw this. He still has 3 more days of Cipro 01/21/17; dermatology appointment tomorrow. He has completed the ciprofloxacin for Pseudomonas. Surface of the wound looks better however he is had some deterioration in the lesions on his right leg. Meantime the left lateral leg wound we will continue with sample KADARRIUS, YANKE ([id number]) 01/29/17; patient had his dermatology appointment but I can't yet see that note. He is completed his antibiotics. The wound is more superficial but considerably larger in circumferential area than when he came in. This is in his left lateral calf. He also has swollen erythematous areas with superficial wounds on the right leg and small papular areas on both arms. There apparently areas in her his upper thighs and buttocks I did not look at those. Dermatology biopsied the right leg. Hopefully will have their input next week. 02/05/17; patient went back to see his dermatologist who told him that he had a "scratching problem" as well as staph. He is now on a 30 day course of doxycycline and I believe she gave him triamcinolone cream to the right leg areas to help with the itching [not exactly sure but probably  triamcinolone]. She apparently looked at the left lateral leg wound although this was not rebiopsied and I think felt to be ultimately part of the same pathogenesis. He is using sample border foam and changing nevus himself. He now has a new open area on the right posterior leg which was his biopsy site I don't have any of the dermatology notes 02/12/17; we put the patient in compression last week with SANTYL to the wound on the left leg and the biopsy. Edema is much better and the depth of the wound is now at level of skin. Area is still the same Biopsy site on the right lateral leg we've also been using santyl with a border foam dressing and he is changing this himself. 02/19/17; Using silver alginate started last week to both the  substantial left leg wound and the biopsy site on the right wound. He is tolerating compression well. Has a an appointment with his primary M.D. tomorrow wondering about diuretics although I'm wondering if the edema problem is actually lymphedema 02/26/17; the patient has been to see his primary doctor Dr. Jerrel Ivory at [facility] our primary care. She started him on Lasix 20 mg and this seems to have helped with the edema. However we are not making substantial change with the left lateral calf wound and inflammation. The biopsy site on the right leg also looks stable but not really all that different. 03/12/17; the patient has been to see vein and vascular Dr. Lucky Cowboy. He has had venous reflux studies I have not reviewed these. I did get a call from his dermatology office. 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 They therefore recommended clobetasol and tetracycline i.e. aggressive treatment for possible  pyoderma gangrenosum. 03/26/17; apparently the patient just had reflux studies not an appointment with Dr. dew. She arrives in clinic today having applied clobetasol for 2-3 weeks. He notes over the last 2-3 days excessive drainage having to change the dressing 3-4 times a day and also expanding erythema. 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 Lillia Carmel at [facility] dermatology locally who kindly saw him at my request. 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 My concern was the degree of erythema medially extending from his left leg wound which was either inflammation from pyoderma or cellulitis. I put him on Augmentin however culture of the wound showed Pseudomonas which is quinolone sensitive. I really don't believe he has cellulitis however in view of everything I will continue and give him a course of Cipro. He is also on doxycycline as an immune modulator for the pyoderma. In addition to his original wound on the left lateral leg with surrounding erythema he has a wound on the right posterior calf which was an original biopsy site done by dermatology. 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 Saw Dr. Lillia Carmel yesterday. He has been using topical antibiotics to both wound areas his original wound on the left and the biopsies/pathergy area on the right. There is definitely some improvement in the inflammation around the wound on the right although the patient states he has increasing sensitivity of the wounds. He is on prednisone 60 and doxycycline 1 as prescribed by Dr. Lillia Carmel. He is covering the topical antibiotic with  gauze and putting this in his own compression stocks and changing this daily. He states that Dr. Lottie Rater did a culture of the left leg wound yesterday 05/07/17; pyoderma gangrenosum. The patient saw Dr. Lillia Carmel yesterday and has a follow-up with her in one month. He is still using topical antibiotics to both wounds although he can't recall exactly what type. He is still on prednisone 60 mg. Dr. Lillia Carmel stated that the doxycycline could stop if we were  in agreement. 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 The right medial leg was induced by biopsy/pathergy. The area on the right is essentially healed. 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 The right medial leg has closed and remains closed. He is still on prednisone 60. He tells me he missed his last dermatology appointment with Dr. Lillia Carmel but will make another appointment. He reports that her blood sugar at a recent screen in [place] was high 200's. He was 180 today. 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 I think he is going to require still much longer prednisone perhaps another 3 months before attempting to taper. In the meantime his wound is a lot better. Smaller. He is cleaning this off daily and applying topical antibiotics. When he was last in the clinic I thought about changing to [facility] and actually put in a couple of calls to dermatology although probably not during their business hours. 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 He continues on oral prednisone. He has been using triple antibiotic ointment. There is surface debris and we will transition to [facility] and have him return in  2 weeks. He has lost 30 pounds since his last appointment with lifestyle modification. He may benefit from topical steroid cream for treatment this can be considered at a later date. 08/22/17 on evaluation today patient appears to actually be doing rather well in regard to his left lateral lower extremity ulcer. He has actually been managed by Dr. Dellia Nims most recently. Patient is currently on oral steroids at this time. This seems to have [name], [name] ([id number]) been of benefit for him. Nonetheless his last visit was actually with Leah on 08/06/17. Currently he is not utilizing any topical steroid creams although this could be of benefit as well. No fevers, chills, nausea, or vomiting noted at this time. 09/05/17 on evaluation today patient appears to be doing better in regard to his left lateral lower extremity ulcer. He has been tolerating the dressing changes without complication. He is using Santyl with good effect. Overall I'm very pleased with how things are standing at this point. Patient likewise is happy that this is doing better. 09/19/17 on evaluation today patient actually appears to be doing rather well in regard to his left lateral lower extremity ulcer. 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 He's not having any significant pain. 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 He has been tolerating the Santyl without complication and in general I feel like he's making good progress. 10/17/17 on evaluation today patient appears to be doing very well in regard to his left lateral lower surety ulcer. He has been tolerating the dressing changes without complication. There does not appear to be any evidence of infection he's alternating the Santyl and the triple antibiotic ointment every other day this seems to  be doing well for him. 11/03/17 on evaluation today patient appears to be doing very well in regard to his left lateral lower extremity ulcer. He  is been tolerating the dressing changes without complication which is good news. Fortunately there does not appear to be any evidence of infection which is also great news. Overall is doing excellent they are starting to taper down on the prednisone is down to 40 mg at this point it also started topical clobetasol for him. 11/17/17 on evaluation today patient appears to be doing well in regard to his left lateral lower surety ulcer. He's been tolerating the dressing changes without complication. He does note that he is having no pain, no excessive drainage or discharge, and overall he feels like things are going about how he would expect and hope they would. Overall he seems to have no evidence of infection at this time in my opinion which is good news. 12/04/17-He is seen in follow-up evaluation for right lateral lower extremity ulcer. He has been applying topical steroid cream. Today's measurement show slight increase in size. Over the next 2 weeks we will transition to every other day Santyl and steroid cream. He has been encouraged to monitor for changes and notify clinic with any concerns 12/15/17 on evaluation today patient's left lateral motion the ulcer and fortunately is doing worse again at this point. This just since last week to this week has close to doubled in size according to the patient. I did not seeing last week's I do not have a visual to compare this to in our system was also down so we do not have all the charts and at this point. Nonetheless it does have me somewhat concerned in regard to the fact that again he was worried enough about it he has contact the dermatology that placed them back on the full strength, 50 mg a day of the prednisone that he was taken previous. He continues to alternate using clobetasol along with Santyl at this  point. He is obviously somewhat frustrated. 12/22/17 on evaluation today patient appears to be doing a little worse compared to last evaluation. Unfortunately the wound is a little deeper and slightly larger than the last week's evaluation. With that being said he has made some progress in regard to the irritation surrounding at this time unfortunately despite that progress that's been made he still has a significant issue going on here. 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 For that reason I discussed with him today the possibility of perform a wound culture to ensure there's nothing overtly infected. 01/06/18 on evaluation today patient's wound is larger and deeper than previously evaluated. With that being said it did appear that his wound was infected after my last evaluation with him. Subsequently I did end up prescribing a prescription for Bactrim DS which she has been taking and having no complication with. Fortunately there does not appear to be any evidence of infection at this point in time as far as anything spreading, no want to touch, and overall I feel like things are showing signs of improvement. 01/13/18 on evaluation today patient appears to be even a little larger and deeper than last time. There still muscle exposed in the base of the wound. Nonetheless he does appear to be less erythematous I do believe inflammation is calming down also believe the infection looks like it's probably resolved at this time based on what I'm seeing. No fevers, chills, nausea, or vomiting noted at this time. 01/30/18 on evaluation today  patient actually appears to visually look better for the most part. Unfortunately those visually this looks better he does seem to potentially have what may be an abscess in the muscle that has been noted in the  central portion of the wound. This is the first time that I have noted what appears to be fluctuance in the central portion of the muscle. With that being said I'm somewhat more concerned about the fact that this might indicate an abscess formation at this location. I do believe that an ultrasound would be appropriate. This is likely something we need to try to do as soon as possible. He has been switch to mupirocin ointment and he is no longer using the steroid ointment as prescribed by dermatology he sees them again next week he's been decreased from 60 to 40 mg of prednisone. 03/09/18 on evaluation today patient actually appears to be doing a little better compared to last time I saw him. There's not as much erythema surrounding the wound itself. He I did review his most recent infectious disease note which was dated 02/24/18. He saw Dr. Michel Bickers in [place]. 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 Patient is now off of antibiotics and they are continually observing this. There seems to be no change in the past two weeks in my pinion based on what the patient says and what I see today compared to what Dr. Megan Salon likely saw two weeks ago. No fevers, chills, nausea, or vomiting noted at this time. 03/23/18 on evaluation today patient's wound actually appears to be showing signs of improvement which is good news. He is currently still on the Dapsone. He is also working on tapering the prednisone to get off of this and Dr. Lottie Rater is working with him in this regard. Nonetheless overall I feel like the wound is doing well it does appear based on the infectious disease note that I reviewed from Dr. 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 I did also review the  results of his ultrasound of left lower extremity which revealed there was a dentist tissue in the base of the wound without an abscess noted. 04/06/18 on evaluation today the patient's left lateral lower extremity ulcer actually appears to be doing fairly well which is excellent news. There does not appear to be any evidence of infection at this time which is also great news. Overall he still does have a significantly large ulceration although little by little he seems to be making progress. He is down to 10 mg a day of the prednisone. 04/20/18 on evaluation today patient actually appears to be doing excellent at this time in regard to his left lower extremity ulcer. He's making signs of good progress unfortunately this is taking much longer than we would really like to see but nonetheless he is making progress. Fortunately there does not appear to be any evidence of infection at this time. No fevers, chills, nausea, or vomiting noted at this time. The patient has not been using the Santyl due to the cost he hadn't got in this field yet. He's mainly been using the antibiotic ointment topically. Subsequently he also tells me that he really has not been scrubbing in the shower I think this would be helpful again  as I told him it doesn't have to be anything too aggressive to even make it believe just enough to keep it free of some of the loose slough and biofilm on the wound surface. 05/11/18 on evaluation today patient's wound appears to be making slow but sure progress in regard to the left lateral lower extremity ulcer. He is been tolerating the dressing changes without complication. Fortunately there does not appear to be any evidence of infection at this time. He is still just using triple antibiotic ointment along with clobetasol occasionally over the area. He never got the Santyl and really does not seem to intend to in my pinion. 06/01/18 on evaluation today patient appears to be doing a little  better in regard to his left lateral lower extremity ulcer. He states that overall he does not feel like he is doing as well with the Dapsone as he did with the prednisone. Nonetheless he sees his dermatologist later today and is gonna talk to them about the possibility of going back on the prednisone. Overall again I believe that the wound would be better if you would utilize Santyl but he really does not seem to be interested in going back to the [place] at this point. He has been using triple antibiotic ointment. 06/15/18 on evaluation today patient's wound actually appears to be doing about the same at this point. Fortunately there is no signs of infection at this time. He has made slight improvements although he continues to not really want to clean the wound bed at this point. He states that he just doesn't mess with it he doesn't want to cause any problems with everything else he has going on. He has been on medication, antibiotics as prescribed by his dermatologist, for a staff infection of his lower extremities which is really drying out now and looking much better he tells me. Fortunately there is no sign of overall infection. 06/29/18 on evaluation today patient appears to be doing well in regard to his left lateral lower surety ulcer all things considering. Fortunately his staff infection seems to be greatly improved compared to previous. He has no signs of infection and this is drying up quite nicely. He is still the doxycycline for this is no longer on cental, Dapsone, or any of the other medications. His dermatologist has recommended possibility of an infusion but right now he does not want to proceed with that. 07/13/18 on evaluation today patient appears to be doing about the same in regard to his left lateral lower surety ulcer. Fortunately there's no signs of infection at this time which is great news. Unfortunately he still builds up a significant amount of Slough/biofilm of the  surface of the wound he still is not really cleaning this as he should be appropriately. Again I'm able to easily with saline and gauze remove the majority of this on the surface which if you would do this at home would likely be a dramatic improvement for him as far as getting the area to improve. Nonetheless overall I still feel like he is making progress is just very slow. I think Santyl will be of benefit for him as well. Still he has not gotten this as of this point. 07/27/18 on evaluation today patient actually appears to be doing little worse in regards of the erythema around the periwound region of the wound he also tells me that he's been having more drainage currently compared to what he was experiencing last time I saw him. He  states not quite as bad as what he had because this was infected previously but nonetheless is still appears to be doing poorly. Fortunately there is no evidence of systemic infection at this point. The patient tells me that he is not going to be able to afford the Santyl. He is still waiting to hear about the infusion therapy with his dermatologist. Apparently she wants an updated colonoscopy first. 08/10/18 on evaluation today patient appears to be doing better in regard to his left lateral lower extremity ulcer. Fortunately he is showing signs of improvement in this regard he's actually been approved for Remicade infusion's as well although this has not been scheduled as of yet. Fortunately there's no signs of active infection at this time in regard to the wound although he is having some issues with infection of the right lower extremity is been seen as dermatologist for this. Fortunately they are definitely still working with him trying to keep things under control. 09/07/18 on evaluation today patient is actually doing rather well in regard to his left lateral lower extremity ulcer. He notes [name], [name] ([id number]) these actually having some hair grow back on  his extremity which is something he has not seen in years. He also tells me that the pain is really not giving them any trouble at this time which is also good news overall she is very pleased with the progress he's using a combination of the mupirocin along with the probate is all mixed. 09/21/18 on evaluation today patient actually appears to be doing fairly well all things considered in regard to his looks from the ulcer. He's been tolerating the dressing changes without complication. Fortunately there's no signs of active infection at this time which is good news he is still on all antibiotics or prevention of the staff infection. He has been on prednisone for time although he states it is gonna contact his dermatologist and see if she put them on a short course due to some irritation that he has going on currently. Fortunately there's no evidence of any overall worsening this is going very slow I think cental would be something that would be helpful for him although he states that $50 for tube is quite expensive. He therefore is not willing to get that at this point. 10/06/18 on evaluation today patient actually appears to be doing decently well in regard to his left lateral leg ulcer. He's been tolerating the dressing changes without complication. Fortunately there's no signs of active infection at this time. Overall I'm actually rather pleased with the progress he's making although it's slow he doesn't show any signs of infection and he does seem to be making some improvement. I do believe that he may need a switch up and dressings to try to help this to heal more appropriately and quickly. Patient History Information obtained from Patient. Family History Diabetes - Mother,Maternal Grandparents, Heart Disease - Mother,Maternal Grandparents, Hereditary Spherocytosis - Siblings, Hypertension - Maternal Grandparents, Stroke - Siblings, No family history of Cancer, Kidney Disease, Lung Disease,  Seizures, Thyroid Problems, Tuberculosis. Social History Current some day smoker, Marital Status - Married, Alcohol Use - Rarely, Drug Use - No History, Caffeine Use - Moderate. Medical History Eyes Denies history of Cataracts, Glaucoma, Optic Neuritis Ear/Nose/Mouth/Throat Denies history of Chronic sinus problems/congestion, Middle ear problems Hematologic/Lymphatic Denies history of Anemia, Hemophilia, Human Immunodeficiency Virus, Lymphedema, Sickle Cell Disease Respiratory Patient has history of Sleep Apnea - cpap Denies history of Aspiration, Asthma, Chronic Obstructive Pulmonary Disease (COPD),  Pneumothorax Cardiovascular Patient has history of Hypertension Denies history of Angina, Arrhythmia, Congestive Heart Failure, Coronary Artery Disease, Deep Vein Thrombosis, Hypotension, Myocardial Infarction, Peripheral Arterial Disease, Peripheral Venous Disease, Phlebitis, Vasculitis Gastrointestinal Patient has history of Colitis Endocrine Denies history of Type I Diabetes, Type II Diabetes Genitourinary Denies history of End Stage Renal Disease Immunological Denies history of Lupus Erythematosus, Raynaud s, Scleroderma Integumentary (Skin) Denies history of History of Burn, History of pressure wounds Musculoskeletal Denies history of Gout, Rheumatoid Arthritis, Osteoarthritis, Osteomyelitis Neurologic Denies history of Dementia, Neuropathy, Quadriplegia, Paraplegia, Seizure Disorder Oncologic Denies history of Received Chemotherapy, Received Radiation Psychiatric Denies history of Anorexia/bulimia, Confinement Anxiety Review of Systems (ROS) Constitutional Symptoms (General Health) Denies complaints or symptoms of Fatigue, Fever, Chills, Marked Weight Change. Respiratory Denies complaints or symptoms of Chronic or frequent coughs, Shortness of Breath. [name], [name] ([id number]) Cardiovascular Denies complaints or symptoms of Chest pain, LE edema. Psychiatric Denies  complaints or symptoms of Anxiety, Claustrophobia. Objective Constitutional Well-nourished and well-hydrated in no acute distress. Vitals Time Taken: 3:46 PM, Height: 71 in, Weight: 338 lbs, BMI: 47.1, Temperature: 98.5 F, Pulse: 77 bpm, Respiratory Rate: 16 breaths/min, Blood Pressure: 131/77 mmHg. Respiratory normal breathing without difficulty. clear to auscultation bilaterally. Cardiovascular regular rate and rhythm with normal S1, S2. Psychiatric this patient is able to make decisions and demonstrates good insight into disease process. Alert and Oriented x 3. pleasant and cooperative. General Notes: Patient's wound bed currently showed Slough on the surface of the wound and again due to the nature of the wound being a Pyoderma gangrenosum type ulcer we are not performing any sharp debridement at this point. Nonetheless I think debridement is necessary. For that reason since cental have not been utilized up to this point due to cost I think we may want to try Iodoflex to see if this could be of benefit. The patient is in agreement with that plan. Integumentary (Hair, Skin) Wound #1 status is Open. Original cause of wound was Gradually Appeared. The wound is located on the Left,Lateral Lower Leg. The wound measures 2.5cm length x 2.7cm width x 0.2cm depth; 5.301cm^2 area and 1.06cm^3 volume. There is Fat Layer (Subcutaneous Tissue) Exposed exposed. There is no tunneling or undermining noted. There is a medium amount of serous drainage noted. The wound margin is distinct with the outline attached to the wound base. There is no granulation within the wound bed. There is a large (67-100%) amount of necrotic tissue within the wound bed including Adherent Slough. 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 Anesthetic (add to Medication List): Wound #1 Left,Lateral Lower Leg: Topical Lidocaine 4% cream applied to wound bed prior to debridement (In Clinic Only). Primary Wound Dressing: Wound #1 Left,Lateral Lower Leg: Santyl Ointment Iodoflex - In clinic Secondary Dressing: Wound #1 Left,Lateral Lower Leg: XtraSorb - secured with tape Dressing Change Frequency: Wound #1 Left,Lateral Lower Leg: Change dressing every day. Follow-up Appointments: Wound #1 Left,Lateral Lower Leg: Return Appointment in 2 weeks. Edema Control: Wound #1 Left,Lateral Lower Leg: Elevate legs to the level of the heart and pump ankles as often as possible Medications-please add to medication list.: Wound #1 Left,Lateral Lower Leg: Santyl Enzymatic Ointment The following medication(s) was prescribed: Santyl topical 250 unit/gram ointment ointment topical applied nickel thick to the wound bed and then cover with a dressing as  directed starting 10/06/2018 Cephus Slater see how things do with the Iodoflex I am gonna send a prescription for the [facility] as well for the patient. He will decide which one he wants to use. We will subsequently see were things stand at follow-up in twoweeks time. Please see above for specific wound care orders. We will see patient for re-evaluation in 2 week(s) here in the clinic. If anything worsens or changes patient will contact our office for additional recommendations. Electronic Signature(s) Signed: 10/06/2018 11:00:46 PM By: Worthy Keeler PA-C Entered By: Worthy Keeler on 10/06/2018 22:55:08 JURON, VORHEES ([id number]) -------------------------------------------------------------------------------- ROS/PFSH Details Patient Name: [name] Date of Service: 10/06/2018 3:30 PM Medical Record Number: [id number] Patient Account Number: [id number] Date of Birth/Sex: 1981/03/30 (37 y.o. M) Treating RN: Harold Barban Primary Care  Provider: [name] Other Clinician: Referring Provider: [name] Treating Provider/Extender: Melburn Hake, Harace Mccluney Weeks in Treatment: 95 Information Obtained From Patient Constitutional Symptoms (General Health) Complaints and Symptoms: Negative for: Fatigue; Fever; Chills; Marked Weight Change Respiratory Complaints and Symptoms: Negative for: Chronic or frequent coughs; Shortness of Breath Medical History: Positive for: Sleep Apnea - cpap Negative for: Aspiration; Asthma; Chronic Obstructive Pulmonary Disease (COPD); Pneumothorax Cardiovascular Complaints and Symptoms: Negative for: Chest pain; LE edema Medical History: Positive for: Hypertension Negative for: Angina; Arrhythmia; Congestive Heart Failure; Coronary Artery Disease; Deep Vein Thrombosis; Hypotension; Myocardial Infarction; Peripheral Arterial Disease; Peripheral Venous Disease; Phlebitis; Vasculitis Psychiatric Complaints and Symptoms: Negative for: Anxiety; Claustrophobia Medical History: Negative for: Anorexia/bulimia; Confinement Anxiety Eyes Medical History: Negative for: Cataracts; Glaucoma; Optic Neuritis Ear/Nose/Mouth/Throat Medical History: Negative for: Chronic sinus problems/congestion; Middle ear problems Hematologic/Lymphatic Medical History: Negative for: Anemia; Hemophilia; Human Immunodeficiency Virus; Lymphedema; Sickle Cell Disease Gastrointestinal Medical History: Positive for: Colitis Endocrine Medical History: Negative for: Type I Diabetes; Type II Diabetes Gift, [place] ([id number]) Genitourinary Medical History: Negative for: End Stage Renal Disease Immunological Medical History: Negative for: Lupus Erythematosus; Raynaudos; Scleroderma Integumentary (Skin) Medical History: Negative for: History of Burn; History of pressure wounds Musculoskeletal Medical History: Negative for: Gout; Rheumatoid Arthritis; Osteoarthritis; Osteomyelitis Neurologic Medical  History: Negative for: Dementia; Neuropathy; Quadriplegia; Paraplegia; Seizure Disorder Oncologic Medical History: Negative for: Received Chemotherapy; Received Radiation Immunizations Pneumococcal Vaccine: Received Pneumococcal Vaccination: No Implantable Devices No devices added Family and Social History Cancer: No; Diabetes: Yes - Mother,Maternal Grandparents; Heart Disease: Yes - Mother,Maternal Grandparents; Hereditary Spherocytosis: Yes - Siblings; Hypertension: Yes - Maternal Grandparents; Kidney Disease: No; Lung Disease: No; Seizures: No; Stroke: Yes - Siblings; Thyroid Problems: No; Tuberculosis: No; Current some day smoker; Marital Status - Married; Alcohol Use: Rarely; Drug Use: No History; Caffeine Use: Moderate; Financial Concerns: No; Food, Clothing or Shelter Needs: No; Support System Lacking: No; Transportation Concerns: No Physician Affirmation I have reviewed and agree with the above information. Electronic Signature(s) Signed: 10/06/2018 11:00:46 PM By: Worthy Keeler PA-C Signed: 10/07/2018 4:44:13 PM By: Harold Barban Entered By: Worthy Keeler on 10/06/2018 22:53:28 [name] ([id number]) -------------------------------------------------------------------------------- SuperBill Details Patient Name: [name] Date of Service: 10/06/2018 Medical Record Number: [id number] Patient Account Number: [id number] Date of Birth/Sex: March 11, 1981 (37 y.o. M) Treating RN: Harold Barban Primary Care Provider: [name] Other Clinician: Referring Provider: [name] Treating Provider/Extender: Melburn Hake, Tacia Hindley Weeks in Treatment: 95 Diagnosis Coding ICD-10 Codes Code Description [phone] Non-pressure chronic ulcer of left calf with fat layer exposed L88 Pyoderma gangrenosum I87.2 Venous insufficiency (chronic) (peripheral) Facility Procedures CPT4 Code: 03128118 Description: 86773 - WOUND CARE VISIT-LEV 2 EST PT Modifier: Quantity: 1 Physician  Procedures CPT4 Code: 8088110 Description: 31594 - WC PHYS LEVEL 4 - EST PT ICD-10 Diagnosis Description L97.222 Non-pressure chronic ulcer of left calf with fat layer expo L88 Pyoderma gangrenosum I87.2 Venous insufficiency (chronic) (peripheral) Modifier: sed Quantity: 1 Electronic Signature(s) Signed: 10/06/2018 11:00:46 PM By: Worthy Keeler PA-C Entered By: Worthy Keeler on 10/06/2018 22:55:18

## 2018-10-09 ENCOUNTER — Ambulatory Visit
Admit: 2018-10-09 | Discharge: 2018-10-10 | Payer: PRIVATE HEALTH INSURANCE | Attending: Dermatology | Primary: Dermatology

## 2018-10-09 DIAGNOSIS — L980 Pyogenic granuloma: Principal | ICD-10-CM

## 2018-10-09 DIAGNOSIS — L88 Pyoderma gangrenosum: Secondary | ICD-10-CM

## 2018-10-09 DIAGNOSIS — L98499 Non-pressure chronic ulcer of skin of other sites with unspecified severity: Secondary | ICD-10-CM

## 2018-10-09 MED ORDER — TRIAMCINOLONE ACETONIDE 0.1 % TOPICAL CREAM
Freq: Two times a day (BID) | TOPICAL | 1 refills | 0.00000 days | Status: CP
Start: 2018-10-09 — End: 2019-10-09

## 2018-10-09 MED ORDER — HYDROCORTISONE 2.5 % TOPICAL CREAM
Freq: Two times a day (BID) | TOPICAL | 1 refills | 0.00000 days | Status: CP
Start: 2018-10-09 — End: 2019-10-09

## 2018-10-09 MED ORDER — DOXYCYCLINE HYCLATE 100 MG TABLET/CAPSULE WRAPPER
Freq: Two times a day (BID) | ORAL | 2 refills | 0.00000 days | Status: CP
Start: 2018-10-09 — End: 2018-12-29

## 2018-10-09 NOTE — Unmapped (Signed)
Skin Cancer Prevention: Care Instructions  Your Care Instructions    Skin cancer is the abnormal growth of cells in the skin. It usually appears as a growth that changes in color, shape, or size. This can be a sore that does not heal or a change in a wart or a mole. Skin cancer is almost always curable when found early and treated. So it is important to see your doctor if you have any of these changes in your skin.  Skin cancer is the most common type of cancer. It often appears on areas of the body that have been exposed to the sun, such as the head, face, neck, back, chest, or shoulders.  Follow-up care is a key part of your treatment and safety. Be sure to make and go to all appointments, and call your doctor if you are having problems. It's also a good idea to know your test results and keep a list of the medicines you take.  How can you care for yourself at home?  ?? Wear a wide-brimmed hat and long sleeves and pants if you are going to be outdoors for a long time.  ?? Avoid the sun between 10 a.m. and 4 p.m., which is the peak time for UV rays.  ?? Wear sunscreen on exposed skin. Make sure to use a broad-spectrum sunscreen that has a sun protection factor (SPF) of 30 or higher. Use it every day, even when it is cloudy.  ?? Do not use tanning booths or sunlamps.  ?? Use lip balm or cream that has sun protection factor (SPF) to protect your lips from getting sunburned.  ?? Wear sunglasses that block UV rays.  When should you call for help?  Call your doctor now or seek immediate medical care if:  ?? ?? You have signs of infection, such as:  ? Increased pain, swelling, warmth, or redness.  ? Red streaks leading from the area.  ? Pus draining from the area.  ? A fever.   ??Watch closely for changes in your health, and be sure to contact your doctor if:  ?? ?? You see a change in your skin, such as a growth or mole that:  ? Grows bigger. This may happen very slowly.  ? Changes color.  ? Changes shape.  ? Starts to bleed easily.   ?? ?? You have swollen glands in your armpits, groin, or neck.   ?? ?? You do not get better as expected.   Where can you learn more?  Go to [facility] at https://myuncchart.org  [facility] [employer]. Enter P392 in the search box to learn more about Skin Cancer Prevention: Care Instructions.  Current as of: August 21, 2019Content Version: 12.4  ?? 2006-2020 [employer].  Care instructions adapted under license by [facility]. If you have questions about a medical condition or this instruction, always ask your healthcare professional. [employer] disclaims any warranty or liability for your use of this information.

## 2018-10-09 NOTE — Unmapped (Signed)
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 Previous aerobic swabs with MSSA. Patient has a tendency to pick at certain areas (evidenced by lichenification) which exacerbates the process. Primary lesion may be initial papules/pustules of PG but over time he has also demonstrated some itching related to venous stasis dermatitis and possible folliculitis.  His lesions are likely multifactorial related to all of these issues.  - cont doxycycline 100mg  BID with food and a full glass of water - discussed risks of stomach upset, sun sensitivity and erosive esophagitis.  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 LL ulcer healing but concern for superficial pyoderma vs traumatic superinfected crusted erosions to R leg. Smoking hx and obesity likely contributing. Hx of UC also likely related. Currently well-controlled  - Currently off of long-term high-dose prednisone use that was complicated by development of cushingoid features and development of diabetes mellitus; stable while currently on dapsone  - Humira and Remicade previously ordered and denied by insurance  - Continue follow-up with wound care  - Continue wearing compression stockings  -cont hibiclens  -cont hydrocortisone and TAC cream per patient preference  -avoid clobetasol given that he develops cellulitis easily under his compression  -consider clinda/rifampin  -consider aspirin or pentoxyfilline  -consider dapsone gel  -cont regular wound care visits  -patient following with GI - colonoscopy currently being scheduled - ILK today to R superior shin (pt reports previous improvement with ILK in the past)  After the patient was informed of risks, benefits and side effects of intralesional steroid injection, the patient elected to undergo injection. Informed verbal consent was obtained. Risk of atrophy  and dyspigmentation with injection was explained. Kenalog 10 mg/ml was injected locally into the sites located R ant shin in a clean fashion following alcohol prep.   Total volume in ml=3.  Number of sites treated: >15   Wound care was explained to the patient        RTC: 2 weeks     SUBJECTIVE    CC: rash    HPI:  This is a pleasant 38 y.o. male with a history of pyoderma gangrenosum and MRSA infection who presents today for f/up.  Notes that his legs have decent control. Taking doxycycline twice daily. Has several areas he picks at on his legs.      Disease history:  -Starting about 2013, he started to develop itchy bumps that enlarge when he scratches and picks at them. This initially affected just his lower legs, but more recently prior to initial presentation to our clinic, involvement spread to his thighs, buttocks, arms, and forearms.  -Areas start as tiny bumps that he then scratches. 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   -In spring 2018, an ulcer started on the L lateral calf and gradually enlarged, but was slowly healing and shrinking by the time of presentation to our clinic. Denies any trauma at this site and notes that it started similar to other rashes on skin. Prior  treatments include oral abx, including as recently as 12/2016 with courses of clinda and doxy, as well as topical steroids. He has h/o ulcerative colitis, now in remission without meds since 2015. Denies h/o liver or kidney disease or DM. Used to live in [place] when this started. Works for [employer] and is on feet all day, endorses swelling of legs. Denies new meds prior to onset.  - At his initial visit 01/22/2017, clobetasol ointment and doxycycline 100mg  bid x 4 weeks were started. A biopsy of a keratotic pruritic plaque was obtained. 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 Tissue culture grew 4+ MSSA. 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   - w/up for underlying pruritus (given patient report and difficulty not picking at the involved areas) on 10/17 revealed unremarkable HIV, hepatitis panel, LDH, TSH, free T4 . Outside slides from previous bx were reviewed by Dr. Eyvonne Left and showed venous stasis changes as well as a neutrophilic infiltrate that could be consistent with PG. He was started on topical clobetasol BID. He was continued on PO doxycycline for additional 6 weeks. His left leg ulcer continued to grow and the site of his previous biopsy on the posterior right leg has also developed a large non healing ulcer.   -10/18: w/up for underlying etiology of PG negative on 10/18: negative spep/upep, cbc w/diff consistently normal, and reports a known hx of ulcerative colitis which is in remssion  - Repeat biopsy as below done on 03/26/17 primarily c/w PG vs infectious with one potential organism on Fite stain but unclear if related.   - Patient started on prednisone on 04/01/17 with almost resolution by 7/19.  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 Stopped doxepin and gabapentin given lack of improvement    Final Diagnosis   Date Value Ref Range Status   03/26/2017   Final    Left lateral shin, punch  - Suppurative inflammation with sinus tract formation, debris, and reactive changes. See comment.          Pertinent PMH:   Ulcerative colitis, in remission per patient  HTN  DM   Denies h/o liver, kidney abnormalities     ROS: A balance of 10 systems was reviewed and negative.     PE:  General: Well-developed, well-nourished male in no acute distress, resting comfortably.  Neuro: Alert and oriented, answers questions appropriately.  Skin: Examination of face, neck, bilateral lower extremities, chest, bilateral arms and hands was notable for:  - b/l legs with lichenified plaques and papules and excoriations  L leg with ulcer w/o surrounding violaceous color, ulcer is thinning  -All other areas examined were normal or had no significant findings.

## 2018-10-12 DIAGNOSIS — L97222 Non-pressure chronic ulcer of left calf with fat layer exposed: Secondary | ICD-10-CM | POA: Diagnosis not present

## 2018-10-12 DIAGNOSIS — G4730 Sleep apnea, unspecified: Secondary | ICD-10-CM | POA: Diagnosis not present

## 2018-10-16 MED ORDER — PEG-ELECTROLYTE SOLUTION 420 GRAM ORAL SOLUTION
0 refills | 0 days | Status: CP
Start: 2018-10-16 — End: ?

## 2018-10-19 ENCOUNTER — Encounter: Payer: 59 | Attending: Physician Assistant | Admitting: Physician Assistant

## 2018-10-19 ENCOUNTER — Other Ambulatory Visit: Payer: Self-pay

## 2018-10-19 DIAGNOSIS — Z8249 Family history of ischemic heart disease and other diseases of the circulatory system: Secondary | ICD-10-CM | POA: Insufficient documentation

## 2018-10-19 DIAGNOSIS — K5190 Ulcerative colitis, unspecified, without complications: Secondary | ICD-10-CM | POA: Insufficient documentation

## 2018-10-19 DIAGNOSIS — F17200 Nicotine dependence, unspecified, uncomplicated: Secondary | ICD-10-CM | POA: Diagnosis not present

## 2018-10-19 DIAGNOSIS — I872 Venous insufficiency (chronic) (peripheral): Secondary | ICD-10-CM | POA: Diagnosis not present

## 2018-10-19 DIAGNOSIS — I10 Essential (primary) hypertension: Secondary | ICD-10-CM | POA: Diagnosis not present

## 2018-10-19 DIAGNOSIS — Z91011 Allergy to milk products: Secondary | ICD-10-CM | POA: Diagnosis not present

## 2018-10-19 DIAGNOSIS — L97222 Non-pressure chronic ulcer of left calf with fat layer exposed: Secondary | ICD-10-CM | POA: Insufficient documentation

## 2018-10-19 DIAGNOSIS — Z823 Family history of stroke: Secondary | ICD-10-CM | POA: Insufficient documentation

## 2018-10-19 NOTE — Progress Notes (Signed)
BURLEIGH, BROCKMANN ([id number]) Visit Report for 10/19/2018 Chief Complaint Document Details Patient Name: [name], [name] Date of Service: 10/19/2018 8:30 AM Medical Record Number: [id number] Patient Account Number: [id number] Date of Birth/Sex: [birth year] (37 y.o. 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 M) Treating RN: [name] Primary Care Provider: Alma [name] Other Clinician: Referring Provider: Alma [name] Treating Provider/Extender: [name], [name] [name] in Treatment: 45 History of Present Illness HPI Description: 12/04/16; 38 year old man who comes into the clinic today for review of a wound on the posterior left calf. He tells me that is been there for about a year. He is not a diabetic he does smoke half a pack per day. He was seen in the ER on 11/20/16 felt to have cellulitis around the wound and was given clindamycin. An x-ray did not show osteomyelitis. The patient initially tells me that he has a milk allergy that sets off a pruritic itching rash on his lower legs which she scratches incessantly and he thinks that's what may have set up the wound. He has been using various topical antibiotics and ointments without any effect. He works in a [employer] and is on his feet all day. He does not have a prior  history of wounds however he does have the rash on both lower legs the right arm and the ventral aspect of his left arm. These are excoriations and clearly have had scratching however there are of macular looking areas on both legs including a substantial larger area on the right leg. This does not have an underlying open area. There is no blistering. The patient tells me that 2 years ago in [place] in response to the rash on his legs he saw a dermatologist who told him he had a condition which may be pyoderma gangrenosum although I may be putting words into his mouth. He seemed to recognize this. 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 He is not in any treatment for this. He's had no recent travel 12/11/16; the patient arrives today with his wound and roughly the same condition we've been using silver alginate this is a deep punched out wound with some surrounding erythema but no tenderness. Biopsy I did did not show confirmed pyoderma gangrenosum suggested nonspecific inflammation and vasculitis but does not provide an actual description of what was seen by the pathologist. I'm really not able to understand this We have also received information from the patient's dermatologist in [place] notes from April 2016. This was a doctor Agarwal- antal. 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 He was prescribed topical steroid high potency under occlusion which helped but at this point the patient did not have a deep punched out wound. 12/18/16; the patient's wound is larger in terms of surface area however this surface looks better and there is less depth. The surrounding erythema also is better. The patient states that the wrap we put on came off 2 days ago when he has been using  his compression stockings. He we are in the process of getting a dermatology consult. 12/26/16 on evaluation today patient's left lower extremity wound shows evidence of  infection with surrounding erythema noted. He has been tolerating the dressing changes but states that he has noted more discomfort. There is a larger area of erythema surrounding the wound. No fevers, chills, nausea, or vomiting noted at this time. With that being said the wound still does have slough covering the surface. He is not allergic to any medication that he is aware of at this point. In regard to his right lower extremity he had several regions that are erythematous and pruritic he wonders if there's anything we can do to help that. 01/02/17 I reviewed patient's wound culture which was obtained his visit last week. He was placed on doxycycline at that point. Unfortunately that does not appear to be an antibiotic that would likely help with the situation however the pseudomonas noted on culture is sensitive to Cipro. Also unfortunately patient's wound seems to have a large compared to last week's evaluation. Not severely so but there are definitely increased measurements in general. He is continuing to have discomfort as well he writes this to be a seven out of 10. In fact he would prefer me not to perform any debridement today due to the fact that he is having discomfort and considering he has an active infection on the little reluctant to do so anyway. No fevers, chills, nausea, or vomiting noted at this time. 01/08/17; patient seems dermatology on September 5. I suspect dermatology will want the slides from the biopsy I did sent to their pathologist. I'm not sure if there is a way we can expedite that. In any case the culture I did before I left on vacation 3 [name] ago showed Pseudomonas he was given 10 days of Cipro and per her description of her intake nurses is actually somewhat better this week although the wound is quite a bit bigger than I remember the last time I saw this. He still has 3 more days of Cipro [name] ([id number]) 01/21/17; dermatology appointment tomorrow. He has  completed the ciprofloxacin for Pseudomonas. Surface of the wound looks better however he is had some deterioration in the lesions on his right leg. Meantime the left lateral leg wound we will continue with sample 01/29/17; patient had his dermatology appointment but I can't yet see that note. He is completed his antibiotics. The wound is more superficial but considerably larger in circumferential area than when he came in. This is in his left lateral calf. He also has swollen erythematous areas with superficial wounds on the right leg and small papular areas on both arms. There apparently areas in her his upper thighs and buttocks I did not look at those. Dermatology biopsied the right leg. Hopefully will have their input next week. 02/05/17; patient went back to see his dermatologist who told him that he had a "scratching problem" as well as staph. He is now on a 30 day course of doxycycline and I believe she gave him triamcinolone cream to the right leg areas to help with the itching [not exactly sure but probably triamcinolone]. She apparently looked at the left lateral leg wound although this was not rebiopsied and I think felt to be ultimately part of the same pathogenesis. He is using sample border foam and changing nevus himself. He now has a new open area on the right posterior leg which was his biopsy site I don't  have any of the dermatology notes 02/12/17; we put the patient in compression last week with SANTYL to the wound on the left leg and the biopsy. Edema is much better and the depth of the wound is now at level of skin. Area is still the same oBiopsy site on the right lateral leg we've also been using santyl with a border foam dressing and he is changing this himself. 02/19/17; Using silver alginate started last week to both the substantial left leg wound and the biopsy site on the right wound. He is tolerating compression well. Has a an appointment with his primary M.D. tomorrow  wondering about diuretics although I'm wondering if the edema problem is actually lymphedema 02/26/17; the patient has been to see his primary doctor Dr. Jerrel Ivory at [facility] our primary care. She started him on Lasix 20 mg and this seems to have helped with the edema. However we are not making substantial change with the left lateral calf wound and inflammation. The biopsy site on the right leg also looks stable but not really all that different. 03/12/17; the patient has been to see vein and vascular Dr. Lucky Cowboy. He has had venous reflux studies I have not reviewed these. I did get a call from his dermatology office. 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 They therefore recommended clobetasol and tetracycline i.e. 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 03/26/17; apparently the patient just had reflux studies not an appointment with Dr. dew. She arrives in clinic today having applied clobetasol for 2-3 [name]. He notes over the last 2-3 days excessive drainage having to change the dressing 3-4 times a day and also expanding erythema. 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 Lillia Carmel at [facility] dermatology locally who kindly saw him at my request. 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 My concern was the degree of erythema medially extending from his left leg wound which was either inflammation from  pyoderma or cellulitis. I put him on Augmentin however culture of the wound showed Pseudomonas which is quinolone sensitive. I really don't believe he has cellulitis however in view of everything I will continue and give him a course of Cipro. He is also on doxycycline as an immune modulator for the pyoderma. In addition to his original wound on the left lateral leg with surrounding erythema he has a wound on the right posterior calf which was an original biopsy site done by dermatology. 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 Saw Dr. Lillia Carmel yesterday. He has been using topical antibiotics to both wound areas his original wound on the left and the biopsies/pathergy area on the right. There is definitely some improvement in the inflammation around the wound on the right although the patient states he has increasing sensitivity of the wounds. He is on prednisone 60 and doxycycline 1 as prescribed by Dr. Lillia Carmel. He is covering the topical  antibiotic with gauze and putting this in his own compression stocks and changing this daily. He states that Dr. Lottie Rater did a culture of the left leg wound yesterday 05/07/17; pyoderma gangrenosum. The patient saw Dr. Lillia Carmel yesterday and has a follow-up with her in one month. He is still using topical antibiotics to both wounds although he can't recall exactly what type. He is still on prednisone 60 mg. Dr. Lillia Carmel stated that the doxycycline could stop if we were in agreement. 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 The right medial leg was induced by biopsy/pathergy. The area on the right is essentially healed. 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 The right medial leg has closed and  remains closed. He is still on prednisone 60. oHe tells me he missed his last dermatology appointment with Dr. Lillia Carmel but will make another appointment. He reports that her blood sugar at a recent screen in [place] was high 200's. He was 180 today. He is more cushingoid blood pressure is Bellis, [name] ([id number]) up a bit. I think he is going to require still much longer prednisone perhaps another 3 months before attempting to taper. In the meantime his wound is a lot better. Smaller. He is cleaning this off daily and applying topical antibiotics. When he was last in the clinic I thought about changing to [facility] and actually put in a couple of calls to dermatology although probably not during their business hours. 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 He continues on oral prednisone. He has been using triple antibiotic ointment. There is surface debris and we will transition to [facility] and have him return in 2 [name]. He has lost 30 pounds since his last appointment with lifestyle modification. He may benefit from topical steroid cream for treatment this can be considered at a later date. 08/22/17 on evaluation today patient appears to actually be doing rather well in regard to his left lateral lower extremity ulcer. He has actually been managed by Dr. Dellia Nims most recently. Patient is currently on oral steroids at this time. This seems to have been of benefit for him. Nonetheless his last visit was actually with Leah on 08/06/17. Currently he is not utilizing any topical steroid creams although this could be of benefit as well. No fevers, chills, nausea, or vomiting noted at this time. 09/05/17 on evaluation today patient appears to be doing better in regard to his left lateral lower extremity ulcer. He has been tolerating the dressing changes without complication.  He is using Santyl with good effect. Overall I'm very pleased with how things are standing at this point. Patient likewise is happy that this is doing better. 09/19/17 on evaluation today patient actually appears to be doing rather well in regard to his left lateral lower extremity ulcer. 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 He's not having any significant pain. 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 He has been tolerating the Santyl without complication and in general I feel like he's making good  progress. 10/17/17 on evaluation today patient appears to be doing very well in regard to his left lateral lower surety ulcer. He has been tolerating the dressing changes without complication. There does not appear to be any evidence of infection he's alternating the Santyl and the triple antibiotic ointment every other day this seems to be doing well for him. 11/03/17 on evaluation today patient appears to be doing very well in regard to his left lateral lower extremity ulcer. He is been tolerating the dressing changes without complication which is good news. Fortunately there does not appear to be any evidence of infection which is also great news. Overall is doing excellent they are starting to taper down on the prednisone is down to 40 mg at this point it also started topical clobetasol for him. 11/17/17 on evaluation today patient appears to be doing well in regard to his left lateral lower surety ulcer. He's been tolerating the dressing changes without complication. He does note that he is having no pain, no excessive drainage or discharge, and overall he feels like things are going about how he would expect and hope they would. Overall he seems to have no evidence of infection at this time in my opinion which is  good news. 12/04/17-He is seen in follow-up evaluation for right lateral lower extremity ulcer. He has been applying topical steroid cream. Today's measurement show slight increase in size. Over the next 2 [name] we will transition to every other day Santyl and steroid cream. He has been encouraged to monitor for changes and notify clinic with any concerns 12/15/17 on evaluation today patient's left lateral motion the ulcer and fortunately is doing worse again at this point. This just since last week to this week has close to doubled in size according to the patient. I did not seeing last week's I do not have a visual to compare this to in our system was also down so we do not have all the charts and at this point. Nonetheless it does have me somewhat concerned in regard to the fact that again he was worried enough about it he has contact the dermatology that placed them back on the full strength, 50 mg a day of the prednisone that he was taken previous. He continues to alternate using clobetasol along with Santyl at this point. He is obviously somewhat frustrated. 12/22/17 on evaluation today patient appears to be doing a little worse compared to last evaluation. Unfortunately the wound is a little deeper and slightly larger than the last week's evaluation. With that being said he has made some progress in regard to the irritation surrounding at this time unfortunately despite that progress that's been made he still has a significant issue going on here. 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 For that reason I discussed with him today the possibility of perform a wound culture to ensure there's nothing overtly infected. 01/06/18 on evaluation today patient's wound is larger and deeper than previously evaluated. With that being said it did appear that  his wound was infected after my last evaluation with him. Subsequently I did end up prescribing a prescription for Bactrim DS which she has been taking and having no complication with. Fortunately there does not appear to be any evidence of [place], Epic ([id number]) infection at this point in time as far as anything spreading, no want to  touch, and overall I feel like things are showing signs of improvement. 01/13/18 on evaluation today patient appears to be even a little larger and deeper than last time. There still muscle exposed in the base of the wound. Nonetheless he does appear to be less erythematous I do believe inflammation is calming down also believe the infection looks like it's probably resolved at this time based on what I'm seeing. No fevers, chills, nausea, or vomiting noted at this time. 01/30/18 on evaluation today patient actually appears to visually look better for the most part. Unfortunately those visually this looks better he does seem to potentially have what may be an abscess in the muscle that has been noted in the central portion of the wound. This is the first time that I have noted what appears to be fluctuance in the central portion of the muscle. With that being said I'm somewhat more concerned about the fact that this might indicate an abscess formation at this location. I do believe that an ultrasound would be appropriate. This is likely something we need to try to do as soon as possible. He has been switch to mupirocin ointment and he is no longer using the steroid ointment as prescribed by dermatology he sees them again next week he's been decreased from 60 to 40 mg of prednisone. 03/09/18 on evaluation today patient actually appears to be doing a little better compared to last time I saw him. There's not as much erythema surrounding the wound itself. He I did review his most recent infectious disease note which was dated 02/24/18. He saw Dr. Michel Bickers in  [place]. 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 Patient is now off of antibiotics and they are continually observing this. There seems to be no change in the past two [name] in my pinion based on what the patient says and what I see today compared to what Dr. Megan Salon likely saw two [name] ago. No fevers, chills, nausea, or vomiting noted at this time. 03/23/18 on evaluation today patient's wound actually appears to be showing signs of improvement which is good news. He is currently still on the Dapsone. He is also working on tapering the prednisone to get off of this and Dr. Lottie Rater is working with him in this regard. Nonetheless overall I feel like the wound is doing well it does appear based on the infectious disease note that I reviewed from Dr. 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 I did also review the results of his ultrasound of left lower extremity which revealed there was a dentist tissue in the base of the wound without an abscess noted. 04/06/18 on evaluation today the patient's left lateral lower extremity ulcer actually appears to be doing fairly well which is excellent news. There does not appear to be any evidence of infection at this time which is also great news. Overall he still does have a significantly large ulceration although little by little he seems to be making progress. He is down to 10 mg a day of the prednisone. 04/20/18 on evaluation today patient actually appears to be doing excellent at this time in regard to his left lower extremity ulcer. He's making signs of good progress unfortunately this is taking much longer than we would really like  to see but nonetheless he is making progress. Fortunately there does not appear to be any evidence of infection at this  time. No fevers, chills, nausea, or vomiting noted at this time. The patient has not been using the Santyl due to the cost he hadn't got in this field yet. He's mainly been using the antibiotic ointment topically. Subsequently he also tells me that he really has not been scrubbing in the shower I think this would be helpful again as I told him it doesn't have to be anything too aggressive to even make it believe just enough to keep it free of some of the loose slough and biofilm on the wound surface. 05/11/18 on evaluation today patient's wound appears to be making slow but sure progress in regard to the left lateral lower extremity ulcer. He is been tolerating the dressing changes without complication. Fortunately there does not appear to be any evidence of infection at this time. He is still just using triple antibiotic ointment along with clobetasol occasionally over the area. He never got the Santyl and really does not seem to intend to in my pinion. 06/01/18 on evaluation today patient appears to be doing a little better in regard to his left lateral lower extremity ulcer. He states that overall he does not feel like he is doing as well with the Dapsone as he did with the prednisone. Nonetheless he sees his dermatologist later today and is gonna talk to them about the possibility of going back on the prednisone. Overall again I believe that the wound would be better if you would utilize Santyl but he really does not seem to be interested in going back to the [place] at this point. He has been using triple antibiotic ointment. 06/15/18 on evaluation today patient's wound actually appears to be doing about the same at this point. Fortunately there is no signs of infection at this time. He has made slight improvements although he continues to not really want to clean the wound bed at this point. He states that he just doesn't mess with it he doesn't want to cause any problems with everything else  he has going on. He has been on medication, antibiotics as prescribed by his dermatologist, for a staff infection of his lower extremities which is really drying out now and looking much better he tells me. Fortunately there is no sign of overall infection. [name], [name] ([id number]) 06/29/18 on evaluation today patient appears to be doing well in regard to his left lateral lower surety ulcer all things considering. Fortunately his staff infection seems to be greatly improved compared to previous. He has no signs of infection and this is drying up quite nicely. He is still the doxycycline for this is no longer on cental, Dapsone, or any of the other medications. His dermatologist has recommended possibility of an infusion but right now he does not want to proceed with that. 07/13/18 on evaluation today patient appears to be doing about the same in regard to his left lateral lower surety ulcer. Fortunately there's no signs of infection at this time which is great news. Unfortunately he still builds up a significant amount of Slough/biofilm of the surface of the wound he still is not really cleaning this as he should be appropriately. Again I'm able to easily with saline and gauze remove the majority of this on the surface which if you would do this at home would likely be a dramatic improvement for him as far as getting the area  to improve. Nonetheless overall I still feel like he is making progress is just very slow. I think Santyl will be of benefit for him as well. Still he has not gotten this as of this point. 07/27/18 on evaluation today patient actually appears to be doing little worse in regards of the erythema around the periwound region of the wound he also tells me that he's been having more drainage currently compared to what he was experiencing last time I saw him. He states not quite as bad as what he had because this was infected previously but nonetheless is still appears to be doing  poorly. Fortunately there is no evidence of systemic infection at this point. The patient tells me that he is not going to be able to afford the Santyl. He is still waiting to hear about the infusion therapy with his dermatologist. Apparently she wants an updated colonoscopy first. 08/10/18 on evaluation today patient appears to be doing better in regard to his left lateral lower extremity ulcer. Fortunately he is showing signs of improvement in this regard he's actually been approved for Remicade infusion's as well although this has not been scheduled as of yet. Fortunately there's no signs of active infection at this time in regard to the wound although he is having some issues with infection of the right lower extremity is been seen as dermatologist for this. Fortunately they are definitely still working with him trying to keep things under control. 09/07/18 on evaluation today patient is actually doing rather well in regard to his left lateral lower extremity ulcer. He notes these actually having some hair grow back on his extremity which is something he has not seen in years. He also tells me that the pain is really not giving them any trouble at this time which is also good news overall she is very pleased with the progress he's using a combination of the mupirocin along with the probate is all mixed. 09/21/18 on evaluation today patient actually appears to be doing fairly well all things considered in regard to his looks from the ulcer. He's been tolerating the dressing changes without complication. Fortunately there's no signs of active infection at this time which is good news he is still on all antibiotics or prevention of the staff infection. He has been on prednisone for time although he states it is gonna contact his dermatologist and see if she put them on a short course due to some irritation that he has going on currently. Fortunately there's no evidence of any overall worsening this is  going very slow I think cental would be something that would be helpful for him although he states that $50 for tube is quite expensive. He therefore is not willing to get that at this point. 10/06/18 on evaluation today patient actually appears to be doing decently well in regard to his left lateral leg ulcer. He's been tolerating the dressing changes without complication. Fortunately there's no signs of active infection at this time. Overall I'm actually rather pleased with the progress he's making although it's slow he doesn't show any signs of infection and he does seem to be making some improvement. I do believe that he may need a switch up and dressings to try to help this to heal more appropriately and quickly. 10/19/18 on evaluation today patient actually appears to be doing better in regard to his left lateral lower extremity ulcer. This is shown signs of having much less Slough buildup at this point due to  the fact he has been using the Santyl. Obviously this is very good news. The overall size of the wound is not dramatically smaller but again the appearance is. Electronic Signature(s) Signed: 10/19/2018 3:03:34 PM By: Worthy Keeler PA-C Entered By: Worthy [name] on 10/19/2018 09:35:50 [name], [name] ([id number]) -------------------------------------------------------------------------------- Physical Exam Details Patient Name: [name] Date of Service: 10/19/2018 8:30 AM Medical Record Number: [id number] Patient Account Number: [id number] Date of Birth/Sex: 30-Apr-1981 (37 y.o. M) Treating RN: [name] Primary Care Provider: Alma [name] Other Clinician: Referring Provider: Alma [name] Treating Provider/Extender: [name], Tobi Leinweber [name] in Treatment: 12 Constitutional Well-nourished and well-hydrated in no acute distress. Respiratory normal breathing without difficulty. clear to auscultation bilaterally. Cardiovascular regular rate and rhythm with normal S1,  S2. Psychiatric this patient is able to make decisions and demonstrates good insight into disease process. Alert and Oriented x 3. pleasant and cooperative. Notes Patient's wound bed currently showed signs of good granulation in some areas there were still some Slough buildup that I couldn't quite get off but at the same time that does not appear to be any evidence of active infection which is good news. No fevers, chills, nausea, or vomiting noted at this time. Electronic Signature(s) Signed: 10/19/2018 3:03:34 PM By: Worthy Keeler PA-C Entered By: Worthy [name] on 10/19/2018 09:36:19 [name], [name] ([id number]) -------------------------------------------------------------------------------- Physician Orders Details Patient Name: [name] Date of Service: 10/19/2018 8:30 AM Medical Record Number: [id number] Patient Account Number: [id number] Date of Birth/Sex: 17-Feb-1981 (37 y.o. 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 Anesthetic (add to Medication List) Wound #1 Left,Lateral Lower Leg o Topical Lidocaine 4% cream applied to wound bed prior to debridement (In Clinic Only). Primary Wound Dressing Wound #1 Left,Lateral Lower Leg o Santyl Ointment - dry gauze to help absorb Secondary Dressing Wound #1 Left,Lateral Lower Leg o Boardered Foam Dressing Dressing Change Frequency Wound #1 Left,Lateral Lower Leg o Change dressing every day. Follow-up Appointments Wound #1 Left,Lateral Lower Leg o Return Appointment in 2  [name]. Edema Control Wound #1 Left,Lateral Lower Leg o Elevate legs to the level of the heart and pump ankles as often as possible Medications-please add to medication list. Wound #1 Left,Lateral Lower Leg o Santyl Enzymatic Ointment Electronic Signature(s) [name], [name] ([id number]) Signed: 10/19/2018 3:03:34 PM By: Worthy Keeler PA-C Signed: 10/19/2018 3:11:16 PM By: [name] Entered By: [name] on 10/19/2018 09:30:07 [name] ([id number]) -------------------------------------------------------------------------------- Problem List Details Patient Name: [name] Date of Service: 10/19/2018 8:30 AM Medical Record Number: [id number] Patient Account Number: [id number] Date of Birth/Sex: 28-Jun-1980 (37 y.o. M) Treating RN: [name] Primary Care Provider: Alma [name] Other Clinician: Referring Provider: Alma [name] Treating Provider/Extender: [name], Monike Bragdon [name] in Treatment: 89 Active Problems ICD-10 Evaluated Encounter Code Description Active Date Today Diagnosis L97.222 Non-pressure chronic ulcer of left calf with fat layer exposed 12/04/2016 No Yes L88 Pyoderma gangrenosum 03/26/2017 No Yes I87.2 Venous insufficiency (chronic) (peripheral) 12/04/2016 No Yes Inactive Problems ICD-10 Code Description Active Date Inactive Date L97.213 Non-pressure chronic ulcer of right calf with necrosis of muscle 04/02/2017 04/02/2017 Resolved Problems Electronic Signature(s) Signed: 10/19/2018 3:03:34 PM By: [name],  Margarita Grizzle PA-C Entered By: Worthy [name] on 10/19/2018 09:23:21 [name], [name] ([id number]) -------------------------------------------------------------------------------- Progress Note Details Patient Name: [name], [name] Date of Service: 10/19/2018 8:30 AM Medical Record Number: [id number] Patient Account Number: [id number] Date of Birth/Sex: 04-15-1981 (37 y.o. M) Treating RN: [name] Primary Care Provider: Alma [name] Other  Clinician: Referring Provider: Alma [name] Treating Provider/Extender: [name], Kastin Cerda [name] in Treatment: 77 Subjective Chief Complaint Information obtained from Patient He is here in follow up evaluation for LLE pyoderma ulcer History of Present Illness (HPI) 12/04/16; 38 year old man who comes into the clinic today for review of a wound on the posterior left calf. He tells me that is been there for about a year. He is not a diabetic he does smoke half a pack per day. He was seen in the ER on 11/20/16 felt to have cellulitis around the wound and was given clindamycin. An x-ray did not show osteomyelitis. The patient initially tells me that he has a milk allergy that sets off a pruritic itching rash on his lower legs which she scratches incessantly and he thinks that's what may have set up the wound. He has been using various topical antibiotics and ointments without any effect. He works in a [employer] and is on his feet all day. He does not have a prior history of wounds however he does have the rash on both lower legs the right arm and the ventral aspect of his left arm. These are excoriations and clearly have had scratching however there are of macular looking areas on both legs including a substantial larger area on the right leg. This does not have an underlying open area. There is no blistering. The patient tells me that 2 years ago in [place] in response to the rash on his legs he saw a dermatologist who told him he had a condition which may be pyoderma gangrenosum although I may be putting words into his mouth. He seemed to recognize this. 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 He is not in any treatment for this. He's had no recent travel 12/11/16; the patient arrives today with his wound and roughly the same condition we've been using silver alginate this is a deep punched out wound with some surrounding erythema but no tenderness.  Biopsy I did did not show confirmed pyoderma gangrenosum suggested nonspecific inflammation and vasculitis but does not provide an actual description of what was seen by the pathologist. I'm really not able to understand this We have also received information from the patient's dermatologist in [place] notes from April 2016. This was a doctor Agarwal- antal. 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 He was prescribed topical steroid high potency under occlusion which helped but at this point the patient did not have a deep punched out wound. 12/18/16; the patient's wound is larger in terms of surface area however this surface looks better and there is less depth. The surrounding erythema also is better. The patient states that the wrap we put on came off 2 days ago when he has been using his compression stockings. He we are in the process of getting a dermatology consult. 12/26/16 on evaluation today patient's left lower extremity wound shows evidence of infection with surrounding erythema noted. He has been tolerating the dressing changes but states that he has noted more discomfort. There is a larger area of erythema surrounding the wound. No fevers, chills, nausea, or vomiting noted at this time. With  that being said the wound still does have slough covering the surface. He is not allergic to any medication that he is aware of at this point. In regard to his right lower extremity he had several regions that are erythematous and pruritic he wonders if there's anything we can do to help that. 01/02/17 I reviewed patient's wound culture which was obtained his visit last week. He was placed on doxycycline at that point. Unfortunately that does not appear to be an antibiotic that would likely help with the situation however the pseudomonas noted on culture is sensitive to Cipro. Also unfortunately patient's wound seems to have a large compared to last week's evaluation. Not severely so but  there are definitely increased measurements in general. He is continuing to have discomfort as well he writes this to be a seven out of 10. In fact he would prefer me not to perform any debridement today due to the fact that he is having discomfort and considering he has an active infection on the little reluctant to do so anyway. No fevers, Backs, Deval ([id number]) chills, nausea, or vomiting noted at this time. 01/08/17; patient seems dermatology on September 5. I suspect dermatology will want the slides from the biopsy I did sent to their pathologist. I'm not sure if there is a way we can expedite that. In any case the culture I did before I left on vacation 3 [name] ago showed Pseudomonas he was given 10 days of Cipro and per her description of her intake nurses is actually somewhat better this week although the wound is quite a bit bigger than I remember the last time I saw this. He still has 3 more days of Cipro 01/21/17; dermatology appointment tomorrow. He has completed the ciprofloxacin for Pseudomonas. Surface of the wound looks better however he is had some deterioration in the lesions on his right leg. Meantime the left lateral leg wound we will continue with sample 01/29/17; patient had his dermatology appointment but I can't yet see that note. He is completed his antibiotics. The wound is more superficial but considerably larger in circumferential area than when he came in. This is in his left lateral calf. He also has swollen erythematous areas with superficial wounds on the right leg and small papular areas on both arms. There apparently areas in her his upper thighs and buttocks I did not look at those. Dermatology biopsied the right leg. Hopefully will have their input next week. 02/05/17; patient went back to see his dermatologist who told him that he had a "scratching problem" as well as staph. He is now on a 30 day course of doxycycline and I believe she gave him triamcinolone  cream to the right leg areas to help with the itching [not exactly sure but probably triamcinolone]. She apparently looked at the left lateral leg wound although this was not rebiopsied and I think felt to be ultimately part of the same pathogenesis. He is using sample border foam and changing nevus himself. He now has a new open area on the right posterior leg which was his biopsy site I don't have any of the dermatology notes 02/12/17; we put the patient in compression last week with SANTYL to the wound on the left leg and the biopsy. Edema is much better and the depth of the wound is now at level of skin. Area is still the same Biopsy site on the right lateral leg we've also been using santyl with a border foam dressing and he  is changing this himself. 02/19/17; Using silver alginate started last week to both the substantial left leg wound and the biopsy site on the right wound. He is tolerating compression well. Has a an appointment with his primary M.D. tomorrow wondering about diuretics although I'm wondering if the edema problem is actually lymphedema 02/26/17; the patient has been to see his primary doctor Dr. Jerrel Ivory at [facility] our primary care. She started him on Lasix 20 mg and this seems to have helped with the edema. However we are not making substantial change with the left lateral calf wound and inflammation. The biopsy site on the right leg also looks stable but not really all that different. 03/12/17; the patient has been to see vein and vascular Dr. Lucky Cowboy. He has had venous reflux studies I have not reviewed these. I did get a call from his dermatology office. 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 They  therefore recommended clobetasol and tetracycline i.e. 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 03/26/17; apparently the patient just had reflux studies not an appointment with Dr. dew. She arrives in clinic today having applied clobetasol for 2-3 [name]. He notes over the last 2-3 days excessive drainage having to change the dressing 3-4 times a day and also expanding erythema. 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 Lillia Carmel at [facility] dermatology locally who kindly saw him at my request. 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 My concern was the degree of erythema medially extending from his left leg wound which was either inflammation from pyoderma or cellulitis. I put him on Augmentin however culture of the wound showed Pseudomonas which is quinolone sensitive. I really don't believe he has cellulitis however in view of everything I will continue and give him a course of Cipro. He is also on doxycycline as an immune modulator for the pyoderma. In addition to his original wound on the left lateral leg with surrounding erythema he has a wound on the right posterior calf which was an original biopsy site done by dermatology. 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 Saw Dr. Lillia Carmel yesterday. He has been using topical antibiotics to both wound areas his original wound on the left and the biopsies/pathergy area on the right. There is definitely some improvement in the inflammation around the wound on the right although the patient states he has increasing sensitivity of the wounds. He is on prednisone 60 and  doxycycline 1 as prescribed by Dr. Lillia Carmel. He is covering the topical antibiotic with gauze and putting this in his own compression stocks and changing this daily. He states that Dr. Lottie Rater did a culture of the left leg wound yesterday 05/07/17; pyoderma gangrenosum. The patient saw Dr. Lillia Carmel yesterday and has a follow-up with her in one month. He is still using topical antibiotics to both wounds although he can't recall exactly what type. He is still on  prednisone 60 mg. Dr. Lillia Carmel stated that the doxycycline could stop if we were in agreement. 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 The right medial leg was induced by [name] ([id number]) biopsy/pathergy. The area on the right is essentially healed. 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 The right medial leg has closed and remains closed. He is still on prednisone 60. He tells me he missed his last dermatology appointment with Dr. Lillia Carmel but will make another appointment. He reports that her blood sugar at a recent screen in [place] was high 200's. He was 180 today. 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 I think he is going to require still much longer prednisone perhaps another 3 months before attempting to taper. In the meantime his wound is a lot better. Smaller. He is cleaning this off daily and applying topical antibiotics. When he was last in the clinic I thought about changing to [facility] and actually put in a couple of calls to dermatology although probably not during their business hours. 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 He continues on oral prednisone. He has  been using triple antibiotic ointment. There is surface debris and we will transition to [facility] and have him return in 2 [name]. He has lost 30 pounds since his last appointment with lifestyle modification. He may benefit from topical steroid cream for treatment this can be considered at a later date. 08/22/17 on evaluation today patient appears to actually be doing rather well in regard to his left lateral lower extremity ulcer. He has actually been managed by Dr. Dellia Nims most recently. Patient is currently on oral steroids at this time. This seems to have been of benefit for him. Nonetheless his last visit was actually with Leah on 08/06/17. Currently he is not utilizing any topical steroid creams although this could be of benefit as well. No fevers, chills, nausea, or vomiting noted at this time. 09/05/17 on evaluation today patient appears to be doing better in regard to his left lateral lower extremity ulcer. He has been tolerating the dressing changes without complication. He is using Santyl with good effect. Overall I'm very pleased with how things are standing at this point. Patient likewise is happy that this is doing better. 09/19/17 on evaluation today patient actually appears to be doing rather well in regard to his left lateral lower extremity ulcer. 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 He's not having any significant pain. 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 He has been tolerating the Santyl without complication and in general I feel like he's making good progress. 10/17/17 on evaluation today patient appears to be doing very well in regard to his left lateral lower surety ulcer. He has been tolerating the dressing changes without complication. There does not appear to be any evidence of infection  he's alternating the Santyl and the triple antibiotic ointment every other day this seems to be doing well for him. 11/03/17 on evaluation today patient appears to  be doing very well in regard to his left lateral lower extremity ulcer. He is been tolerating the dressing changes without complication which is good news. Fortunately there does not appear to be any evidence of infection which is also great news. Overall is doing excellent they are starting to taper down on the prednisone is down to 40 mg at this point it also started topical clobetasol for him. 11/17/17 on evaluation today patient appears to be doing well in regard to his left lateral lower surety ulcer. He's been tolerating the dressing changes without complication. He does note that he is having no pain, no excessive drainage or discharge, and overall he feels like things are going about how he would expect and hope they would. Overall he seems to have no evidence of infection at this time in my opinion which is good news. 12/04/17-He is seen in follow-up evaluation for right lateral lower extremity ulcer. He has been applying topical steroid cream. Today's measurement show slight increase in size. Over the next 2 [name] we will transition to every other day Santyl and steroid cream. He has been encouraged to monitor for changes and notify clinic with any concerns 12/15/17 on evaluation today patient's left lateral motion the ulcer and fortunately is doing worse again at this point. This just since last week to this week has close to doubled in size according to the patient. I did not seeing last week's I do not have a visual to compare this to in our system was also down so we do not have all the charts and at this point. Nonetheless it does have me somewhat concerned in regard to the fact that again he was worried enough about it he has contact the dermatology that placed them back on the full strength, 50 mg a day of the prednisone that  he was taken previous. He continues to alternate using clobetasol along with Santyl at this point. He is obviously somewhat frustrated. 12/22/17 on evaluation today patient appears to be doing a little worse compared to last evaluation. Unfortunately the wound is a little deeper and slightly larger than the last week's evaluation. With that being said he has made some progress in regard to the irritation surrounding at this time unfortunately despite that progress that's been made he still has a significant issue going on here. I'm not certain that he is having really any true infection at this time although with the Pyoderma gangrenosum it can Dvorsky, [name] ([id number]) sometimes be difficult to differentiate infection versus just inflammation. For that reason I discussed with him today the possibility of perform a wound culture to ensure there's nothing overtly infected. 01/06/18 on evaluation today patient's wound is larger and deeper than previously evaluated. With that being said it did appear that his wound was infected after my last evaluation with him. Subsequently I did end up prescribing a prescription for Bactrim DS which she has been taking and having no complication with. Fortunately there does not appear to be any evidence of infection at this point in time as far as anything spreading, no want to touch, and overall I feel like things are showing signs of improvement. 01/13/18 on evaluation today patient appears to be even a little larger and deeper than last time. There still muscle exposed in the base of the wound. Nonetheless he does appear to be less erythematous I do believe inflammation is calming down also believe the infection looks like it's probably resolved at this time based on  what I'm seeing. No fevers, chills, nausea, or vomiting noted at this time. 01/30/18 on evaluation today patient actually appears to visually look better for the most part. Unfortunately those visually  this looks better he does seem to potentially have what may be an abscess in the muscle that has been noted in the central portion of the wound. This is the first time that I have noted what appears to be fluctuance in the central portion of the muscle. With that being said I'm somewhat more concerned about the fact that this might indicate an abscess formation at this location. I do believe that an ultrasound would be appropriate. This is likely something we need to try to do as soon as possible. He has been switch to mupirocin ointment and he is no longer using the steroid ointment as prescribed by dermatology he sees them again next week he's been decreased from 60 to 40 mg of prednisone. 03/09/18 on evaluation today patient actually appears to be doing a little better compared to last time I saw him. There's not as much erythema surrounding the wound itself. He I did review his most recent infectious disease note which was dated 02/24/18. He saw Dr. Michel Bickers in [place]. 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 Patient is now off of antibiotics and they are continually observing this. There seems to be no change in the past two [name] in my pinion based on what the patient says and what I see today compared to what Dr. Megan Salon likely saw two [name] ago. No fevers, chills, nausea, or vomiting noted at this time. 03/23/18 on evaluation today patient's wound actually appears to be showing signs of improvement which is good news. He is currently still on the Dapsone. He is also working on tapering the prednisone to get off of this and Dr. Lottie Rater is working with him in this regard. Nonetheless overall I feel like the wound is doing well it does appear based on the infectious disease note that I reviewed from Dr. 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 I did also review the results of his ultrasound of left lower extremity which revealed there was a dentist tissue in the base of the wound without an abscess noted. 04/06/18 on evaluation today the patient's left lateral lower extremity ulcer actually appears to be doing fairly well which is excellent news. There does not appear to be any evidence of infection at this time which is also great news. Overall he still does have a significantly large ulceration although little by little he seems to be making progress. He is down to 10 mg a day of the prednisone. 04/20/18 on evaluation today patient actually appears to be doing excellent at this time in regard to his left lower extremity ulcer. He's making signs of good progress unfortunately this is taking much longer than we would really like to see but nonetheless he is making progress. Fortunately there does not appear to be any evidence of infection at this time. No fevers, chills, nausea, or vomiting noted at this time. The patient has not been using the Santyl due to the cost he hadn't got in this field yet. He's mainly been using the antibiotic ointment topically. Subsequently he also tells me that he really  has not been scrubbing in the shower I think this would be helpful again as I told him it doesn't have to be anything too aggressive to even make it believe just enough to keep it free of some of the loose slough and biofilm on the wound surface. 05/11/18 on evaluation today patient's wound appears to be making slow but sure progress in regard to the left lateral lower extremity ulcer. He is been tolerating the dressing changes without complication. Fortunately there does not appear to be any evidence of infection at this time. He is still just using triple antibiotic ointment along with clobetasol occasionally over the area. He never got the Santyl and really does not seem to  intend to in my pinion. 06/01/18 on evaluation today patient appears to be doing a little better in regard to his left lateral lower extremity ulcer. He states that overall he does not feel like he is doing as well with the Dapsone as he did with the prednisone. Nonetheless he sees his dermatologist later today and is gonna talk to them about the possibility of going back on the prednisone. Overall again I believe that the wound would be better if you would utilize Santyl but he really does not seem to be interested in going back to the [place] at this point. He has been using triple antibiotic ointment. ZAKARIA, SEDOR ([id number]) 06/15/18 on evaluation today patient's wound actually appears to be doing about the same at this point. Fortunately there is no signs of infection at this time. He has made slight improvements although he continues to not really want to clean the wound bed at this point. He states that he just doesn't mess with it he doesn't want to cause any problems with everything else he has going on. He has been on medication, antibiotics as prescribed by his dermatologist, for a staff infection of his lower extremities which is really drying out now and looking much better he tells me. Fortunately there is no sign of overall infection. 06/29/18 on evaluation today patient appears to be doing well in regard to his left lateral lower surety ulcer all things considering. Fortunately his staff infection seems to be greatly improved compared to previous. He has no signs of infection and this is drying up quite nicely. He is still the doxycycline for this is no longer on cental, Dapsone, or any of the other medications. His dermatologist has recommended possibility of an infusion but right now he does not want to proceed with that. 07/13/18 on evaluation today patient appears to be doing about the same in regard to his left lateral lower surety ulcer. Fortunately there's no signs of infection  at this time which is great news. Unfortunately he still builds up a significant amount of Slough/biofilm of the surface of the wound he still is not really cleaning this as he should be appropriately. Again I'm able to easily with saline and gauze remove the majority of this on the surface which if you would do this at home would likely be a dramatic improvement for him as far as getting the area to improve. Nonetheless overall I still feel like he is making progress is just very slow. I think Santyl will be of benefit for him as well. Still he has not gotten this as of this point. 07/27/18 on evaluation today patient actually appears to be doing little worse in regards of the erythema around the periwound region of the wound he also tells me that he's  been having more drainage currently compared to what he was experiencing last time I saw him. He states not quite as bad as what he had because this was infected previously but nonetheless is still appears to be doing poorly. Fortunately there is no evidence of systemic infection at this point. The patient tells me that he is not going to be able to afford the Santyl. He is still waiting to hear about the infusion therapy with his dermatologist. Apparently she wants an updated colonoscopy first. 08/10/18 on evaluation today patient appears to be doing better in regard to his left lateral lower extremity ulcer. Fortunately he is showing signs of improvement in this regard he's actually been approved for Remicade infusion's as well although this has not been scheduled as of yet. Fortunately there's no signs of active infection at this time in regard to the wound although he is having some issues with infection of the right lower extremity is been seen as dermatologist for this. Fortunately they are definitely still working with him trying to keep things under control. 09/07/18 on evaluation today patient is actually doing rather well in regard to his left  lateral lower extremity ulcer. He notes these actually having some hair grow back on his extremity which is something he has not seen in years. He also tells me that the pain is really not giving them any trouble at this time which is also good news overall she is very pleased with the progress he's using a combination of the mupirocin along with the probate is all mixed. 09/21/18 on evaluation today patient actually appears to be doing fairly well all things considered in regard to his looks from the ulcer. He's been tolerating the dressing changes without complication. Fortunately there's no signs of active infection at this time which is good news he is still on all antibiotics or prevention of the staff infection. He has been on prednisone for time although he states it is gonna contact his dermatologist and see if she put them on a short course due to some irritation that he has going on currently. Fortunately there's no evidence of any overall worsening this is going very slow I think cental would be something that would be helpful for him although he states that $50 for tube is quite expensive. He therefore is not willing to get that at this point. 10/06/18 on evaluation today patient actually appears to be doing decently well in regard to his left lateral leg ulcer. He's been tolerating the dressing changes without complication. Fortunately there's no signs of active infection at this time. Overall I'm actually rather pleased with the progress he's making although it's slow he doesn't show any signs of infection and he does seem to be making some improvement. I do believe that he may need a switch up and dressings to try to help this to heal more appropriately and quickly. 10/19/18 on evaluation today patient actually appears to be doing better in regard to his left lateral lower extremity ulcer. This is shown signs of having much less Slough buildup at this point due to the fact he has been  using the [employer]. Obviously this is very good news. The overall size of the wound is not dramatically smaller but again the appearance is. Patient History Information obtained from Patient. Family History [name], [name] ([id number]) Diabetes - Mother,Maternal Grandparents, Heart Disease - Mother,Maternal Grandparents, Hereditary Spherocytosis - Siblings, Hypertension - Maternal Grandparents, Stroke - Siblings, No family history of Cancer, Kidney Disease,  Lung Disease, Seizures, Thyroid Problems, Tuberculosis. Social History Current some day smoker, Marital Status - Married, Alcohol Use - Rarely, Drug Use - No History, Caffeine Use - Moderate. Medical History Eyes Denies history of Cataracts, Glaucoma, Optic Neuritis Ear/Nose/Mouth/Throat Denies history of Chronic sinus problems/congestion, Middle ear problems Hematologic/Lymphatic Denies history of Anemia, Hemophilia, Human Immunodeficiency Virus, Lymphedema, Sickle Cell Disease Respiratory Patient has history of Sleep Apnea - cpap Denies history of Aspiration, Asthma, Chronic Obstructive Pulmonary Disease (COPD), Pneumothorax Cardiovascular Patient has history of Hypertension Denies history of Angina, Arrhythmia, Congestive Heart Failure, Coronary Artery Disease, Deep Vein Thrombosis, Hypotension, Myocardial Infarction, Peripheral Arterial Disease, Peripheral Venous Disease, Phlebitis, Vasculitis Gastrointestinal Patient has history of Colitis Endocrine Denies history of Type I Diabetes, Type II Diabetes Genitourinary Denies history of End Stage Renal Disease Immunological Denies history of Lupus Erythematosus, Raynaud s, Scleroderma Integumentary (Skin) Denies history of History of Burn, History of pressure wounds Musculoskeletal Denies history of Gout, Rheumatoid Arthritis, Osteoarthritis, Osteomyelitis Neurologic Denies history of Dementia, Neuropathy, Quadriplegia, Paraplegia, Seizure Disorder Oncologic Denies history of  Received Chemotherapy, Received Radiation Psychiatric Denies history of Anorexia/bulimia, Confinement Anxiety Review of Systems (ROS) Constitutional Symptoms (General Health) Denies complaints or symptoms of Fatigue, Fever, Chills, Marked Weight Change. Respiratory Denies complaints or symptoms of Chronic or frequent coughs, Shortness of Breath. Cardiovascular Denies complaints or symptoms of Chest pain, LE edema. Psychiatric Denies complaints or symptoms of Anxiety, Claustrophobia. Objective [name], [name] ([id number]) Constitutional Well-nourished and well-hydrated in no acute distress. Vitals Time Taken: 8:49 AM, Height: 71 in, Weight: 338 lbs, BMI: 47.1, Temperature: 98.5 F, Pulse: 83 bpm, Respiratory Rate: 16 breaths/min, Blood Pressure: 141/72 mmHg. Respiratory normal breathing without difficulty. clear to auscultation bilaterally. Cardiovascular regular rate and rhythm with normal S1, S2. Psychiatric this patient is able to make decisions and demonstrates good insight into disease process. Alert and Oriented x 3. pleasant and cooperative. General Notes: Patient's wound bed currently showed signs of good granulation in some areas there were still some Slough buildup that I couldn't quite get off but at the same time that does not appear to be any evidence of active infection which is good news. No fevers, chills, nausea, or vomiting noted at this time. Integumentary (Hair, Skin) Wound #1 status is Open. Original cause of wound was Gradually Appeared. The wound is located on the Left,Lateral Lower Leg. The wound measures 2.5cm length x 2.7cm width x 0.2cm depth; 5.301cm^2 area and 1.06cm^3 volume. There is Fat Layer (Subcutaneous Tissue) Exposed exposed. There is no tunneling or undermining noted. There is a medium amount of serous drainage noted. The wound margin is distinct with the outline attached to the wound base. There is no granulation within the wound bed. There is a  large (67-100%) amount of necrotic tissue within the wound bed including Adherent Slough. 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 Anesthetic (add to Medication List): Wound #1 Left,Lateral Lower Leg: Topical Lidocaine 4% cream applied to wound bed prior to debridement (In Clinic Only). Primary Wound Dressing: Wound #1 Left,Lateral Lower Leg: Santyl Ointment - dry gauze to help absorb Bang, Kalix ([id number]) Secondary Dressing: Wound #1 Left,Lateral Lower Leg: Boardered Foam Dressing Dressing Change Frequency: Wound #1 Left,Lateral Lower Leg: Change dressing every day. Follow-up Appointments: Wound #1 Left,Lateral Lower Leg: Return Appointment in 2 [name]. Edema Control: Wound #1 Left,Lateral Lower Leg: Elevate legs  to the level of the heart and pump ankles as often as possible Medications-please add to medication list.: Wound #1 Left,Lateral Lower Leg: Santyl Enzymatic Ointment My suggestion currently is gonna be that we continue with the Santyl per above for the time being. The patient is in agreement with plan. We will subsequently see were things stand at follow-up. If anything changes or worsens meantime he will contact the office and let me know. Please see above for specific wound care orders. We will see patient for re-evaluation in 2 week(s) here in the clinic. If anything worsens or changes patient will contact our office for additional recommendations. Electronic Signature(s) Signed: 10/19/2018 3:03:34 PM By: Worthy Keeler PA-C Entered By: Worthy [name] on 10/19/2018 09:36:42 NYSIR, FERGUSSON ([id number]) -------------------------------------------------------------------------------- ROS/PFSH Details Patient Name: [name] Date of Service: 10/19/2018  8:30 AM Medical Record Number: [id number] Patient Account Number: [id number] Date of Birth/Sex: 1981/03/29 (37 y.o. M) Treating RN: [name] Primary Care Provider: Alma [name] Other Clinician: Referring Provider: Alma [name] Treating Provider/Extender: [name], Macgregor Aeschliman [name] in Treatment: 32 Information Obtained From Patient Constitutional Symptoms (General Health) Complaints and Symptoms: Negative for: Fatigue; Fever; Chills; Marked Weight Change Respiratory Complaints and Symptoms: Negative for: Chronic or frequent coughs; Shortness of Breath Medical History: Positive for: Sleep Apnea - cpap Negative for: Aspiration; Asthma; Chronic Obstructive Pulmonary Disease (COPD); Pneumothorax Cardiovascular Complaints and Symptoms: Negative for: Chest pain; LE edema Medical History: Positive for: Hypertension Negative for: Angina; Arrhythmia; Congestive Heart Failure; Coronary Artery Disease; Deep Vein Thrombosis; Hypotension; Myocardial Infarction; Peripheral Arterial Disease; Peripheral Venous Disease; Phlebitis; Vasculitis Psychiatric Complaints and Symptoms: Negative for: Anxiety; Claustrophobia Medical History: Negative for: Anorexia/bulimia; Confinement Anxiety Eyes Medical History: Negative for: Cataracts; Glaucoma; Optic Neuritis Ear/Nose/Mouth/Throat Medical History: Negative for: Chronic sinus problems/congestion; Middle ear problems Hematologic/Lymphatic Medical History: Negative for: Anemia; Hemophilia; Human Immunodeficiency Virus; Lymphedema; Sickle Cell Disease [place]unsmore, Hugh ([id number]) Gastrointestinal Medical History: Positive for: Colitis Endocrine Medical History: Negative for: Type I Diabetes; Type II Diabetes Genitourinary Medical History: Negative for: End Stage Renal Disease Immunological Medical History: Negative for: Lupus Erythematosus; Raynaudos; Scleroderma Integumentary (Skin) Medical History: Negative for: History of Burn;  History of pressure wounds Musculoskeletal Medical History: Negative for: Gout; Rheumatoid Arthritis; Osteoarthritis; Osteomyelitis Neurologic Medical History: Negative for: Dementia; Neuropathy; Quadriplegia; Paraplegia; Seizure Disorder Oncologic Medical History: Negative for: Received Chemotherapy; Received Radiation Immunizations Pneumococcal Vaccine: Received Pneumococcal Vaccination: No Implantable Devices No devices added Family and Social History Cancer: No; Diabetes: Yes - Mother,Maternal Grandparents; Heart Disease: Yes - Mother,Maternal Grandparents; Hereditary Spherocytosis: Yes - Siblings; Hypertension: Yes - Maternal Grandparents; Kidney Disease: No; Lung Disease: No; Seizures: No; Stroke: Yes - Siblings; Thyroid Problems: No; Tuberculosis: No; Current some day smoker; Marital Status - Married; Alcohol Use: Rarely; Drug Use: No History; Caffeine Use: Moderate; Financial Concerns: No; Food, Clothing or Shelter Needs: No; Support System Lacking: No; Transportation Concerns: No Physician Affirmation I have reviewed and agree with the above information. Electronic Signature(s) Signed: 10/19/2018 3:03:34 PM By: Hedy Camara, [place] ([id number]) Signed: 10/19/2018 3:11:16 PM By: [name] Entered By: Worthy [name] on 10/19/2018 09:36:05 ANIVAL, PASHA ([id number]) -------------------------------------------------------------------------------- SuperBill Details Patient Name: [name] Date of Service: 10/19/2018 Medical Record Number: [id number] Patient Account Number: [id number] Date of Birth/Sex: [birth year] (37 y.o. M) Treating RN: [name] Primary Care Provider: Alma [name] Other Clinician: Referring Provider: Alma [name] Treating Provider/Extender: [name], Tu[place] Kapaun [name] in Treatment: 65 Diagnosis Coding ICD-10 Codes Code Description [phone] Non-pressure chronic ulcer of left calf  with fat layer exposed L88 Pyoderma  gangrenosum I87.2 Venous insufficiency (chronic) (peripheral) Facility Procedures CPT4 Code: [id number] Description: 41290 - WOUND CARE VISIT-LEV 3 EST PT Modifier: Quantity: 1 Physician Procedures CPT4 Code: [id number] Description: 79217 - WC PHYS LEVEL 4 - EST PT ICD-10 Diagnosis Description L97.222 Non-pressure chronic ulcer of left calf with fat layer expo L88 Pyoderma gangrenosum I87.2 Venous insufficiency (chronic) (peripheral) Modifier: sed Quantity: 1 Electronic Signature(s) Signed: 10/19/2018 3:03:34 PM By: Worthy Keeler PA-C Entered By: Worthy [name] on 10/19/2018 09:36:56

## 2018-10-21 NOTE — Progress Notes (Signed)
BASSEM, BERNASCONI ([id number]) Visit Report for 10/19/2018 Arrival Information Details Patient Name: [name], [name] Date of Service: 10/19/2018 8:30 AM Medical Record Number: [id number] Patient Account Number: [id number] Date of Birth/Sex: 24-Apr-1981 (37 y.o. M) Treating RN: [name] Primary Care Diamante Truszkowski: [name] Other Clinician: Referring Jeremey Bascom: [name] Treating Dusan Lipford/Extender: Melburn Hake, [name]: 9 Visit Information History Since Last Visit Added or deleted any medications: No Patient Arrived: Ambulatory Any new allergies or adverse reactions: No Arrival Time: 08:48 Had a fall or experienced change in No Accompanied By: self activities of daily living that may affect Transfer Assistance: None risk of falls: Patient Identification Verified: Yes Signs or symptoms of abuse/neglect since last visito No Secondary Verification Process Completed: Yes Hospitalized since last visit: No Patient Requires Transmission-Based No Implantable device outside of the clinic excluding No Precautions: cellular tissue based products placed in the center Patient Has Alerts: No since last visit: Has Dressing in Place as Prescribed: Yes Pain Present Now: No Electronic Signature(s) Signed: 10/20/2018 5:54:26 PM By: [name], [name], RN, [name], [name], [name] Entered By: [name], [name], RN, [name], [name] on 10/19/2018 08:48:53 [name] ([id number]) -------------------------------------------------------------------------------- Clinic Level of Care Assessment Details Patient Name: [name] Date of Service: 10/19/2018 8:30 AM Medical Record Number: [id number] Patient Account Number: [id number] Date of Birth/Sex: 11/13/1980 (37 y.o. M) Treating RN: Harold Barban Primary Care Roshad Hack: [name] Other Clinician: Referring Rondal Vandevelde: [name] Treating Georgene Kopper/Extender: Melburn Hake, [name]: 58 Clinic Level of Care Assessment Items TOOL 4 Quantity  Score []  - Use when only an EandM is performed on FOLLOW-UP visit 0 ASSESSMENTS - Nursing Assessment / Reassessment X - Reassessment of Co-morbidities (includes updates in patient status) 1 10 X- 1 5 Reassessment of Adherence to Treatment Plan ASSESSMENTS - Wound and Skin Assessment / Reassessment X - Simple Wound Assessment / Reassessment - one wound 1 5 []  - 0 Complex Wound Assessment / Reassessment - multiple wounds []  - 0 Dermatologic / Skin Assessment (not related to wound area) ASSESSMENTS - Focused Assessment []  - Circumferential Edema Measurements - multi extremities 0 []  - 0 Nutritional Assessment / Counseling / Intervention []  - 0 Lower Extremity Assessment (monofilament, tuning fork, pulses) []  - 0 Peripheral Arterial Disease Assessment (using hand held doppler) ASSESSMENTS - Ostomy and/or Continence Assessment and Care []  - Incontinence Assessment and Management 0 []  - 0 Ostomy Care Assessment and Management (repouching, etc.) PROCESS - Coordination of Care X - Simple Patient / Family Education for ongoing care 1 15 []  - 0 Complex (extensive) Patient / Family Education for ongoing care []  - 0 Staff obtains Programmer, systems, Records, Test Results / Process Orders []  - 0 Staff telephones HHA, Nursing Homes / Clarify orders / etc []  - 0 Routine Transfer to another Facility (non-emergent condition) []  - 0 Routine Hospital Admission (non-emergent condition) []  - 0 New Admissions / Biomedical engineer / Ordering NPWT, Apligraf, etc. []  - 0 Emergency Hospital Admission (emergent condition) X- 1 10 Simple Discharge Coordination [place]ff, Yordan ([id number]) []  - 0 Complex (extensive) Discharge Coordination PROCESS - Special Needs []  - Pediatric / Minor Patient Management 0 []  - 0 Isolation Patient Management []  - 0 Hearing / Language / Visual special needs []  - 0 Assessment of Community assistance (transportation, D/C planning, etc.) []  - 0 Additional assistance /  Altered mentation []  - 0 Support Surface(s) Assessment (bed, cushion, seat, etc.) INTERVENTIONS - Wound Cleansing / Measurement X - Simple Wound Cleansing - one wound 1 5 []  -  0 Complex Wound Cleansing - multiple wounds X- 1 5 Wound Imaging (photographs - any number of wounds) []  - 0 Wound Tracing (instead of photographs) X- 1 5 Simple Wound Measurement - one wound []  - 0 Complex Wound Measurement - multiple wounds INTERVENTIONS - Wound Dressings X - Small Wound Dressing one or multiple wounds 1 10 []  - 0 Medium Wound Dressing one or multiple wounds []  - 0 Large Wound Dressing one or multiple wounds X- 1 5 Application of Medications - topical []  - 0 Application of Medications - injection INTERVENTIONS - Miscellaneous []  - External ear exam 0 []  - 0 Specimen Collection (cultures, biopsies, blood, body fluids, etc.) []  - 0 Specimen(s) / Culture(s) sent or taken to Lab for analysis []  - 0 Patient Transfer (multiple staff / Civil Service fast streamer / Similar devices) []  - 0 Simple Staple / Suture removal (25 or less) []  - 0 Complex Staple / Suture removal (26 or more) []  - 0 Hypo / Hyperglycemic Management (close monitor of Blood Glucose) []  - 0 Ankle / Brachial Index (ABI) - do not check if billed separately X- 1 5 Vital Signs [name], [name] ([id number]) Has the patient been seen at the hospital within the last three years: Yes Total Score: 80 Level Of Care: New/Established - Level 3 Electronic Signature(s) Signed: 10/19/2018 3:11:16 PM By: Harold Barban Entered By: Harold Barban on 10/19/2018 09:26:56 [name] ([id number]) -------------------------------------------------------------------------------- Encounter Discharge Information Details Patient Name: [name] Date of Service: 10/19/2018 8:30 AM Medical Record Number: [id number] Patient Account Number: [id number] Date of Birth/Sex: [birth year] (37 y.o. M) Treating RN: Harold Barban Primary Care Monalisa Bayless: [name] Other Clinician: Referring Syeda Prickett: [name] Treating Naila Elizondo/Extender: Melburn Hake, [name]: 22 Encounter Discharge Information Items Discharge Condition: Stable Ambulatory Status: Ambulatory Discharge Destination: Home Transportation: Private Auto Accompanied By: self Schedule Follow-up Appointment: Yes Clinical Summary of Care: Electronic Signature(s) Signed: 10/19/2018 3:11:16 PM By: Harold Barban Entered By: Harold Barban on 10/19/2018 09:35:21 [name] ([id number]) -------------------------------------------------------------------------------- Lower Extremity Assessment Details Patient Name: [name] Date of Service: 10/19/2018 8:30 AM Medical Record Number: [id number] Patient Account Number: [id number] Date of Birth/Sex: 02-25-1981 (37 y.o. M) Treating RN: [name] Primary Care Quantez Schnyder: [name] Other Clinician: Referring Page Lancon: [name] Treating Aryelle Figg/Extender: Melburn Hake, [name]: 64 Edema Assessment Assessed: [Left: Yes] [Right: No] Edema: [Left: Ye] [Right: s] Calf Left: Right: Point of Measurement: 33 cm From Medial Instep 51 cm cm Ankle Left: Right: Point of Measurement: 12 cm From Medial Instep 30 cm cm Vascular Assessment Pulses: Dorsalis Pedis Palpable: [Left:Yes] Electronic Signature(s) Signed: 10/20/2018 5:54:26 PM By: [name], [name], RN, [name], [name], [name] Entered By: [name], [name], RN, [name], [name] on 10/19/2018 08:56:16 [name] ([id number]) -------------------------------------------------------------------------------- Multi Wound Chart Details Patient Name: [name] Date of Service: 10/19/2018 8:30 AM Medical Record Number: [id number] Patient Account Number: [id number] Date of Birth/Sex: 03-10-1981 (37 y.o. M) Treating RN: Harold Barban Primary Care [name] [name]: [name] Other Clinician: Referring [name] [name]: [name] Treating Johnnay Pleitez/Extender: Melburn Hake,  [name]: 39 Vital Signs Height(in): 71 Pulse(bpm): 83 Weight(lbs): 338 Blood Pressure(mmHg): 141/72 Body Mass Index(BMI): 47 Temperature(F): 98.5 Respiratory Rate 16 (breaths/min): Photos: [N/A:N/A] Wound Location: Left Lower Leg - Lateral N/A N/A Wounding Event: Gradually Appeared N/A N/A Primary Etiology: Pyoderma N/A N/A Comorbid History: Sleep Apnea, Hypertension, N/A N/A Colitis Date Acquired: 11/18/2015 N/A N/A Weeks of Treatment: 97 N/A N/A Wound Status: Open N/A N/A Measurements L x W x D 2.5x2.7x0.2 N/A  N/A (cm) Area (cm) : 5.301 N/A N/A Volume (cm) : 1.06 N/A N/A % Reduction in Area: -8.00% N/A N/A % Reduction in Volume: 73.00% N/A N/A Classification: Full Thickness With Exposed N/A N/A Support Structures Exudate Amount: Medium N/A N/A Exudate Type: Serous N/A N/A Exudate Color: amber N/A N/A Wound Margin: Distinct, outline attached N/A N/A Granulation Amount: None Present (0%) N/A N/A Necrotic Amount: Large (67-100%) N/A N/A Exposed Structures: Fat Layer (Subcutaneous N/A N/A Tissue) Exposed: Yes Fascia: No Tendon: No Muscle: No Joint: No Bone: No [name], [name] ([id number]) Epithelialization: None N/A N/A Treatment Notes Electronic Signature(s) Signed: 10/19/2018 3:11:16 PM By: Harold Barban Entered By: Harold Barban on 10/19/2018 09:25:51 [name] ([id number]) -------------------------------------------------------------------------------- Multi-Disciplinary Care Plan Details Patient Name: [name] Date of Service: 10/19/2018 8:30 AM Medical Record Number: [id number] Patient Account Number: [id number] Date of Birth/Sex: 12/06/1980 (37 y.o. M) Treating RN: Harold Barban Primary Care Tejon Gracie: [name] Other Clinician: Referring Madeleine Fenn: [name] Treating Amel Kitch/Extender: Melburn Hake, [name]: 55 Active Inactive Orientation to the Wound Care Program Nursing Diagnoses: Knowledge deficit  related to the wound healing center program Goals: Patient/caregiver will verbalize understanding of the [facility] Date Initiated: 12/11/2016 Target Resolution Date: 03/13/2017 Goal Status: Active Interventions: Provide education on orientation to the wound center Notes: Venous Leg Ulcer Nursing Diagnoses: Knowledge deficit related to disease process and management Potential for venous Insuffiency (use before diagnosis confirmed) Goals: Non-invasive venous studies are completed as ordered Date Initiated: 12/11/2016 Target Resolution Date: 03/13/2017 Goal Status: Active Patient will maintain optimal edema control Date Initiated: 12/11/2016 Target Resolution Date: 03/13/2017 Goal Status: Active Patient/caregiver will verbalize understanding of disease process and disease management Date Initiated: 12/11/2016 Target Resolution Date: 03/13/2017 Goal Status: Active Verify adequate tissue perfusion prior to therapeutic compression application Date Initiated: 12/11/2016 Target Resolution Date: 03/13/2017 Goal Status: Active Interventions: Assess peripheral edema status every visit. Compression as ordered Provide education on venous insufficiency Treatment Activities: RANKIN, COOLMAN ([id number]) Therapeutic compression applied : 12/11/2016 Notes: Wound/Skin Impairment Nursing Diagnoses: Impaired tissue integrity Knowledge deficit related to ulceration/compromised skin integrity Goals: Patient/caregiver will verbalize understanding of skin care regimen Date Initiated: 12/11/2016 Target Resolution Date: 03/13/2017 Goal Status: Active Ulcer/skin breakdown will have a volume reduction of 30% by week 4 Date Initiated: 12/11/2016 Target Resolution Date: 03/13/2017 Goal Status: Active Ulcer/skin breakdown will have a volume reduction of 50% by week 8 Date Initiated: 12/11/2016 Target Resolution Date: 03/13/2017 Goal Status: Active Ulcer/skin breakdown will have a  volume reduction of 80% by week 12 Date Initiated: 12/11/2016 Target Resolution Date: 03/13/2017 Goal Status: Active Ulcer/skin breakdown will heal within 14 weeks Date Initiated: 12/11/2016 Target Resolution Date: 03/13/2017 Goal Status: Active Interventions: Assess patient/caregiver ability to obtain necessary supplies Assess patient/caregiver ability to perform ulcer/skin care regimen upon admission and as needed Assess ulceration(s) every visit Provide education on ulcer and skin care Treatment Activities: Skin care regimen initiated : 12/11/2016 Topical wound management initiated : 12/11/2016 Notes: Electronic Signature(s) Signed: 10/19/2018 3:11:16 PM By: Harold Barban Entered By: Harold Barban on 10/19/2018 09:25:42 [name] ([id number]) -------------------------------------------------------------------------------- Pain Assessment Details Patient Name: [name] Date of Service: 10/19/2018 8:30 AM Medical Record Number: [id number] Patient Account Number: [id number] Date of Birth/Sex: [birth year] (37 y.o. M) Treating RN: [name] Primary Care Lyndi Holbein: [name] Other Clinician: Referring Janica Eldred: [name] Treating Quantavis Obryant/Extender: Melburn Hake, [name]: 47 Active Problems Location of Pain Severity and Description of Pain Patient Has Paino No Site Locations Pain Management and  Medication Current Pain Management: Notes Patient has tenderness, no pain. Electronic Signature(s) Signed: 10/20/2018 5:54:26 PM By: [name], [name], RN, [name], [name], [name] Entered By: [name], [name], RN, [name], [name] on 10/19/2018 08:49:13 [name], [name] ([id number]) -------------------------------------------------------------------------------- Patient/Caregiver Education Details Patient Name: [name] Date of Service: 10/19/2018 8:30 AM Medical Record Number: [id number] Patient Account Number: [id number] Date of Birth/Gender: [birth year] (37 y.o. M) Treating RN: Harold Barban Primary Care Physician: [name] Other Clinician: Referring Physician: [name] Treating Physician/Extender: Sharalyn Ink in Treatment: 40 Education Assessment Education Provided To: Patient Education Topics Provided Wound/Skin Impairment: Handouts: Caring for Your Ulcer Methods: Demonstration, Explain/Verbal Responses: State content correctly Electronic Signature(s) Signed: 10/19/2018 3:11:16 PM By: Harold Barban Entered By: Harold Barban on 10/19/2018 09:26:05 [name] ([id number]) -------------------------------------------------------------------------------- Wound Assessment Details Patient Name: [name] Date of Service: 10/19/2018 8:30 AM Medical Record Number: [id number] Patient Account Number: [id number] Date of Birth/Sex: 21-Mar-1981 (37 y.o. M) Treating RN: [name] Primary Care Jamiere Gulas: [name] Other Clinician: Referring Thales Knipple: [name] Treating Latoria Dry/Extender: Melburn Hake, [name]: 65 Wound Status Wound Number: 1 Primary Etiology: Pyoderma Wound Location: Left Lower Leg - Lateral Wound Status: Open Wounding Event: Gradually Appeared Comorbid History: Sleep Apnea, Hypertension, Colitis Date Acquired: 11/18/2015 Weeks Of Treatment: 97 Clustered Wound: No Photos Wound Measurements Length: (cm) 2.5 % Reduction Width: (cm) 2.7 % Reduction Depth: (cm) 0.2 Epitheliali Area: (cm) 5.301 Tunneling: Volume: (cm) 1.06 Underminin in Area: -8% in Volume: 73% zation: None No g: No Wound Description Full Thickness With Exposed Support Foul Odor A Classification: Structures Slough/Fibr Wound Margin: Distinct, outline attached Exudate Medium Amount: Exudate Type: Serous Exudate Color: amber fter Cleansing: No ino Yes Wound Bed Granulation Amount: None Present (0%) Exposed Structure Necrotic Amount: Large (67-100%) Fascia Exposed: No Necrotic Quality: Adherent Slough Fat Layer  (Subcutaneous Tissue) Exposed: Yes Tendon Exposed: No Muscle Exposed: No Joint Exposed: No Bone Exposed: No [name], [name] ([id number]) Treatment Notes Wound #1 (Left, Lateral Lower Leg) Notes Santyl, dry gauze ( to absorb), BFD Electronic Signature(s) Signed: 10/20/2018 5:54:26 PM By: [name], [name], RN, [name], [name], [name] Entered By: [name], [name], RN, [name], [name] on 10/19/2018 08:55:13 [name] ([id number]) -------------------------------------------------------------------------------- [name] Details Patient Name: [name] Date of Service: 10/19/2018 8:30 AM Medical Record Number: [id number] Patient Account Number: [id number] Date of Birth/Sex: 1981-04-15 (37 y.o. M) Treating RN: [name] Primary Care Neno Hohensee: [name] Other Clinician: Referring Terrence Wishon: [name] Treating Matthews Franks/Extender: Melburn Hake, [name]: 65 Vital Signs Time Taken: 08:49 Temperature (F): 98.5 Height (in): 71 Pulse (bpm): 83 Weight (lbs): 338 Respiratory Rate (breaths/min): 16 Body Mass Index (BMI): 47.1 Blood Pressure (mmHg): 141/72 Reference Range: 80 - 120 mg / dl Electronic Signature(s) Signed: 10/20/2018 5:54:26 PM By: [name], [name], RN, [name], [name], [name] Entered By: [name], [name], RN, [name], [name] on 10/19/2018 08:51:05

## 2018-10-29 ENCOUNTER — Ambulatory Visit (INDEPENDENT_AMBULATORY_CARE_PROVIDER_SITE_OTHER): Payer: 59

## 2018-10-29 DIAGNOSIS — Z23 Encounter for immunization: Secondary | ICD-10-CM | POA: Diagnosis not present

## 2018-11-01 IMAGING — DX DG KNEE COMPLETE 4+V*L*
4 series · 4 of 4 positions shown · non-contrast
Comparison: Left lower leg dated 11/20/2016.

CLINICAL DATA: Left knee pain for the past 3 months. No known
injury.

EXAM:
LEFT KNEE - COMPLETE 4+ VIEW

[knee ap]
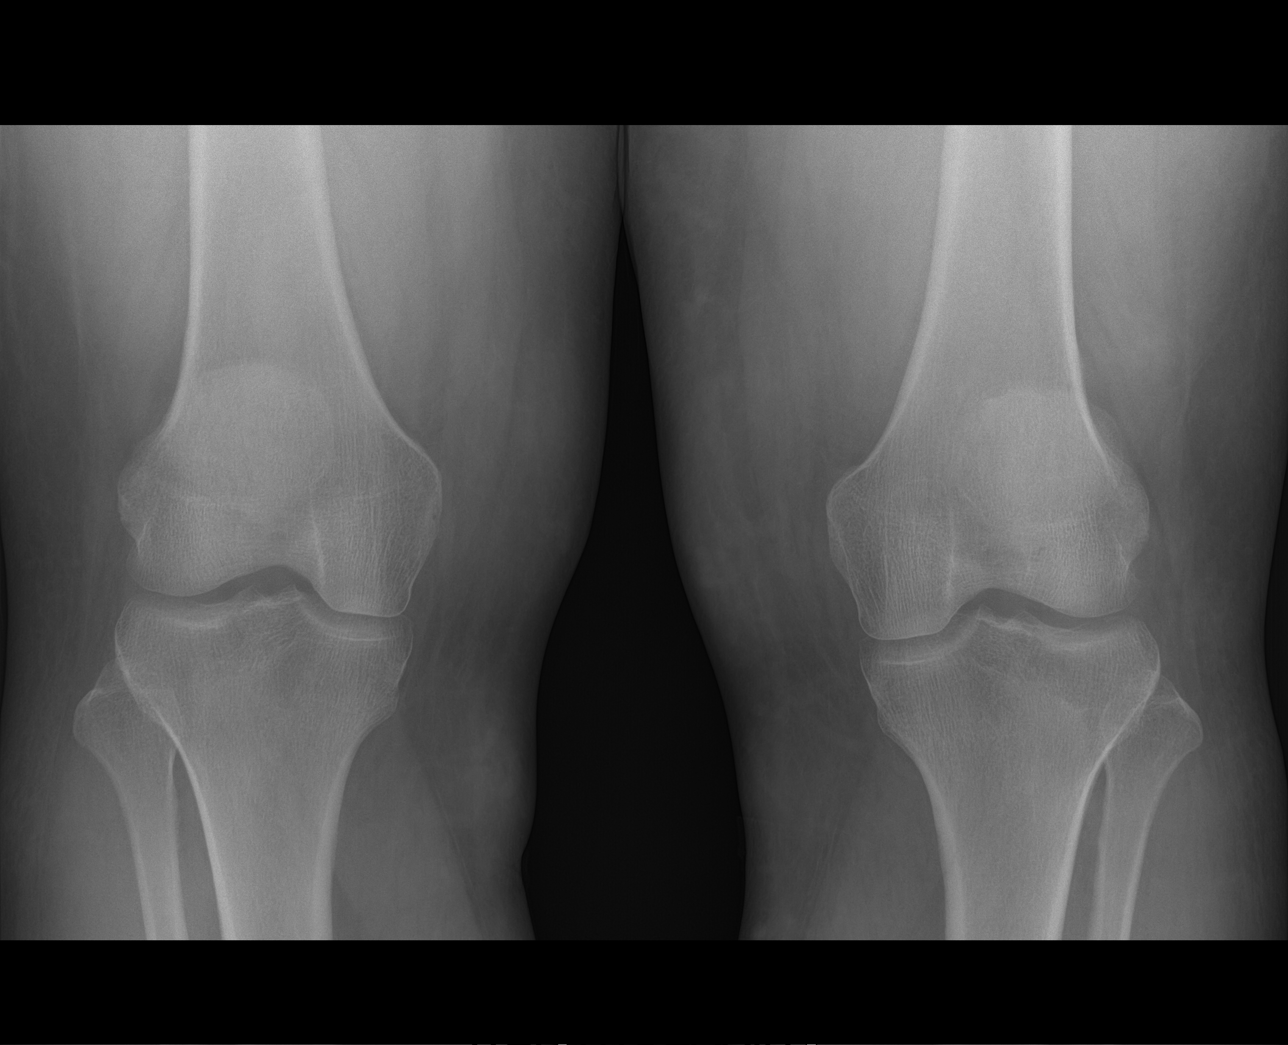

[knee tunnel]
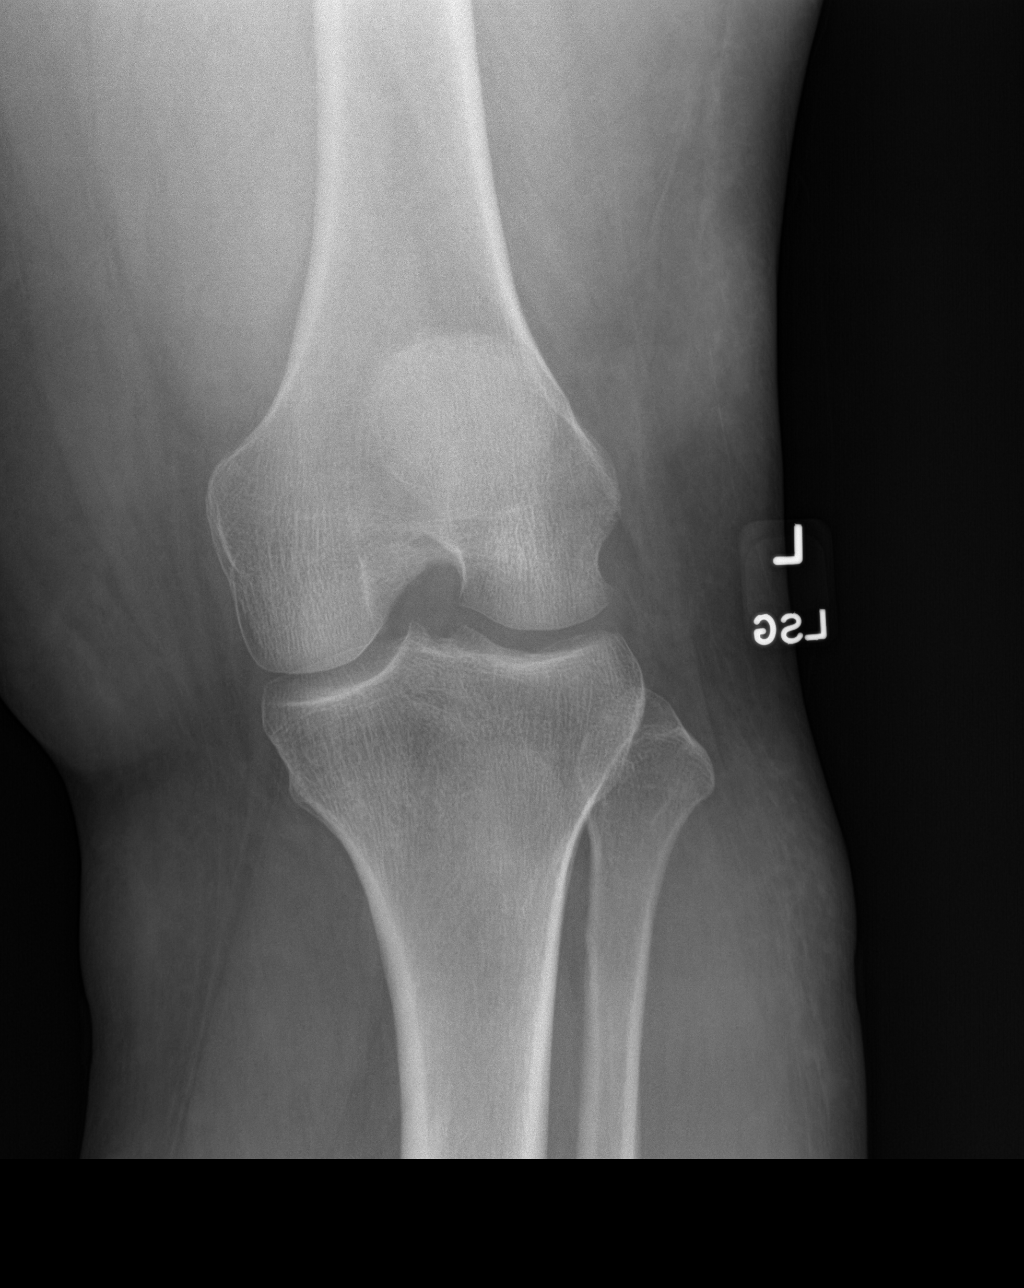

[knee lat]
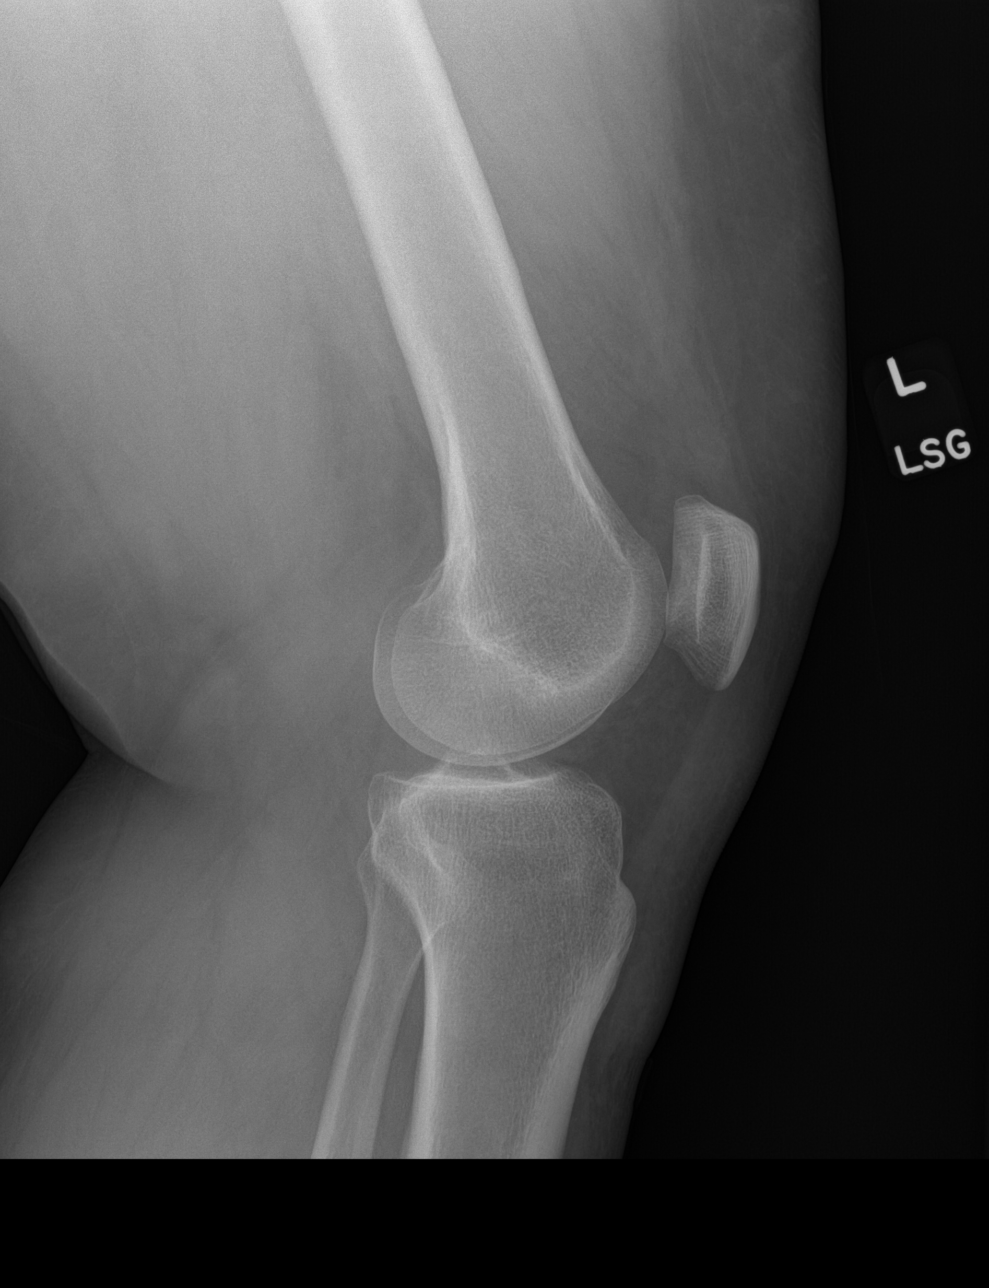

[patella skyline]
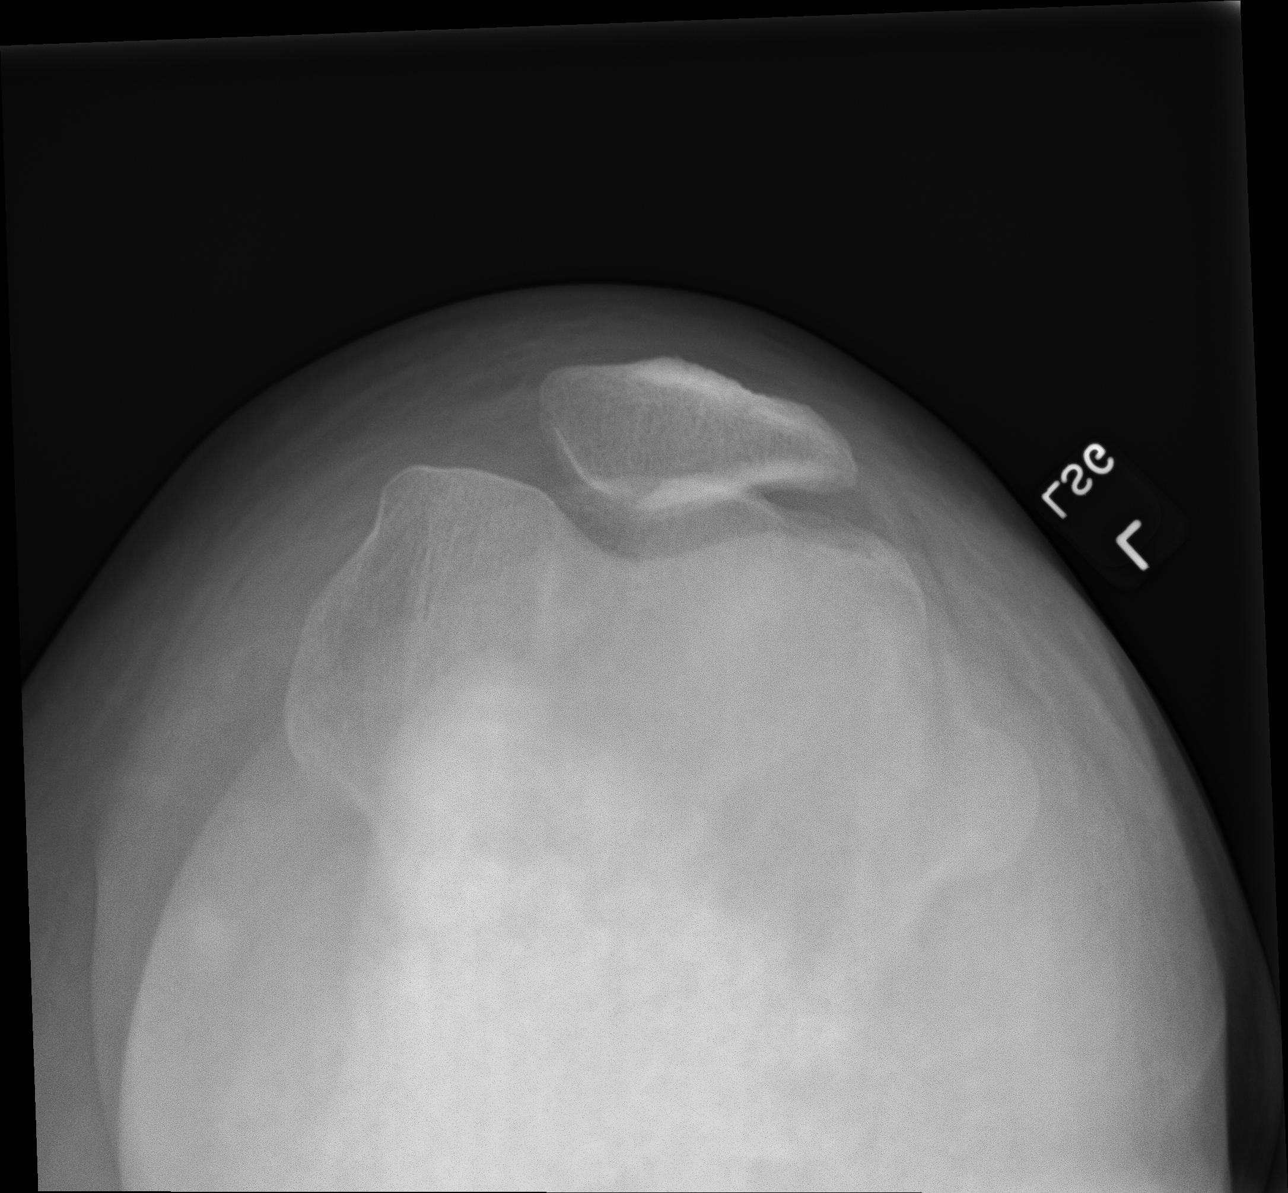

[4 of 4 positions shown; findings below may reference images not displayed]

FINDINGS: Minimal posterior patellar spur formation. Otherwise, unremarkable
bones and soft tissues. No effusion seen. An AP view of the right
knee is unremarkable.
IMPRESSION: Minimal patellofemoral degenerative change.

## 2018-11-02 ENCOUNTER — Encounter: Payer: 59 | Admitting: Physician Assistant

## 2018-11-02 ENCOUNTER — Other Ambulatory Visit: Payer: Self-pay

## 2018-11-02 DIAGNOSIS — L97222 Non-pressure chronic ulcer of left calf with fat layer exposed: Secondary | ICD-10-CM | POA: Diagnosis not present

## 2018-11-04 ENCOUNTER — Ambulatory Visit: Payer: 59

## 2018-11-04 NOTE — Progress Notes (Signed)
NYZIER, BOIVIN ([id number]) Visit Report for 11/02/2018 Arrival Information Details Patient Name: [name], [name] Date of Service: 11/02/2018 8:30 AM Medical Record Number: [id number] Patient Account Number: [id number] Date of Birth/Sex: 31-Dec-1980 (37 y.o. M) Treating RN: [name] Primary Care Katrine Radich: [name] Other Clinician: Referring Gitel Beste: [name] Treating Zavian Slowey/Extender: Melburn Hake, [name]: 15 Visit Information History Since Last Visit Added or deleted any medications: No Patient Arrived: Ambulatory Any new allergies or adverse reactions: No Arrival Time: 08:26 Had a fall or experienced change in No Accompanied By: self activities of daily living that may affect Transfer Assistance: None risk of falls: Patient Identification Verified: Yes Signs or symptoms of abuse/neglect since last visito No Secondary Verification Process Completed: Yes Hospitalized since last visit: No Patient Requires Transmission-Based No Implantable device outside of the clinic excluding No Precautions: cellular tissue based products placed in the center Patient Has Alerts: No since last visit: Has Dressing in Place as Prescribed: Yes Pain Present Now: No Electronic Signature(s) Signed: 11/02/2018 4:30:53 PM By: [name], [name], RN, [name], [name], [name] Entered By: [name], [name], RN, [name], [name] on 11/02/2018 08:27:17 [name] ([id number]) -------------------------------------------------------------------------------- Clinic Level of Care Assessment Details Patient Name: [name] Date of Service: 11/02/2018 8:30 AM Medical Record Number: [id number] Patient Account Number: [id number] Date of Birth/Sex: 02-20-1981 (37 y.o. M) Treating RN: Harold Barban Primary Care Jordana Dugue: [name] Other Clinician: Referring Donalyn Schneeberger: [name] Treating Falan Hensler/Extender: Melburn Hake, [name]: 33 Clinic Level of Care Assessment Items TOOL 4 Quantity  Score []  - Use when only an EandM is performed on FOLLOW-UP visit 0 ASSESSMENTS - Nursing Assessment / Reassessment []  - Reassessment of Co-morbidities (includes updates in patient status) 0 []  - 0 Reassessment of Adherence to Treatment Plan ASSESSMENTS - Wound and Skin Assessment / Reassessment X - Simple Wound Assessment / Reassessment - one wound 1 5 []  - 0 Complex Wound Assessment / Reassessment - multiple wounds []  - 0 Dermatologic / Skin Assessment (not related to wound area) ASSESSMENTS - Focused Assessment []  - Circumferential Edema Measurements - multi extremities 0 []  - 0 Nutritional Assessment / Counseling / Intervention []  - 0 Lower Extremity Assessment (monofilament, tuning fork, pulses) []  - 0 Peripheral Arterial Disease Assessment (using hand held doppler) ASSESSMENTS - Ostomy and/or Continence Assessment and Care []  - Incontinence Assessment and Management 0 []  - 0 Ostomy Care Assessment and Management (repouching, etc.) PROCESS - Coordination of Care X - Simple Patient / Family Education for ongoing care 1 15 []  - 0 Complex (extensive) Patient / Family Education for ongoing care []  - 0 Staff obtains Programmer, systems, Records, Test Results / Process Orders []  - 0 Staff telephones HHA, Nursing Homes / Clarify orders / etc []  - 0 Routine Transfer to another Facility (non-emergent condition) []  - 0 Routine Hospital Admission (non-emergent condition) []  - 0 New Admissions / Biomedical engineer / Ordering NPWT, Apligraf, etc. []  - 0 Emergency Hospital Admission (emergent condition) X- 1 10 Simple Discharge Coordination Sieler, Jamiere ([id number]) []  - 0 Complex (extensive) Discharge Coordination PROCESS - Special Needs []  - Pediatric / Minor Patient Management 0 []  - 0 Isolation Patient Management []  - 0 Hearing / Language / Visual special needs []  - 0 Assessment of Community assistance (transportation, D/C planning, etc.) []  - 0 Additional assistance /  Altered mentation []  - 0 Support Surface(s) Assessment (bed, cushion, seat, etc.) INTERVENTIONS - Wound Cleansing / Measurement X - Simple Wound Cleansing - one wound 1 5 []  - 0  Complex Wound Cleansing - multiple wounds X- 1 5 Wound Imaging (photographs - any number of wounds) []  - 0 Wound Tracing (instead of photographs) X- 1 5 Simple Wound Measurement - one wound []  - 0 Complex Wound Measurement - multiple wounds INTERVENTIONS - Wound Dressings X - Small Wound Dressing one or multiple wounds 1 10 []  - 0 Medium Wound Dressing one or multiple wounds []  - 0 Large Wound Dressing one or multiple wounds X- 1 5 Application of Medications - topical []  - 0 Application of Medications - injection INTERVENTIONS - Miscellaneous []  - External ear exam 0 []  - 0 Specimen Collection (cultures, biopsies, blood, body fluids, etc.) []  - 0 Specimen(s) / Culture(s) sent or taken to Lab for analysis []  - 0 Patient Transfer (multiple staff / Civil Service fast streamer / Similar devices) []  - 0 Simple Staple / Suture removal (25 or less) []  - 0 Complex Staple / Suture removal (26 or more) []  - 0 Hypo / Hyperglycemic Management (close monitor of Blood Glucose) []  - 0 Ankle / Brachial Index (ABI) - do not check if billed separately X- 1 5 Vital Signs Qian, Dejion ([id number]) Has the patient been seen at the hospital within the last three years: Yes Total Score: 65 Level Of Care: New/Established - Level 2 Electronic Signature(s) Signed: 11/02/2018 3:14:44 PM By: Harold Barban Entered By: Harold Barban on 11/02/2018 Idabel, Pasqualino ([id number]) -------------------------------------------------------------------------------- Encounter Discharge Information Details Patient Name: [name] Date of Service: 11/02/2018 8:30 AM Medical Record Number: [id number] Patient Account Number: [id number] Date of Birth/Sex: [birth year] (37 y.o. M) Treating RN: Army Melia Primary Care Gwendolyne Welford: [name] Other Clinician: Referring Antawan Mchugh: [name] Treating Legna Mausolf/Extender: Melburn Hake, [name]: 33 Encounter Discharge Information Items Discharge Condition: Stable Ambulatory Status: Ambulatory Discharge Destination: Home Transportation: Private Auto Accompanied By: self Schedule Follow-up Appointment: Yes Clinical Summary of Care: Electronic Signature(s) Signed: 11/02/2018 11:20:28 AM By: Army Melia Entered By: Army Melia on 11/02/2018 10:06:41 [name] ([id number]) -------------------------------------------------------------------------------- Lower Extremity Assessment Details Patient Name: [name] Date of Service: 11/02/2018 8:30 AM Medical Record Number: [id number] Patient Account Number: [id number] Date of Birth/Sex: Oct 02, 1980 (37 y.o. M) Treating RN: [name] Primary Care Rayan Ines: [name] Other Clinician: Referring Dax Murguia: [name] Treating Nakea Gouger/Extender: Melburn Hake, [name]: 99 Edema Assessment Assessed: [Left: No] [Right: No] [Left: Edema] [Right: :] Calf Left: Right: Point of Measurement: 33 cm From Medial Instep 51.5 cm cm Ankle Left: Right: Point of Measurement: 12 cm From Medial Instep 31 cm cm Vascular Assessment Pulses: Dorsalis Pedis Palpable: [Left:Yes] Electronic Signature(s) Signed: 11/02/2018 4:30:53 PM By: [name], [name], RN, [name], [name], [name] Entered By: [name], [name], RN, [name], [name] on 11/02/2018 08:34:47 [name] ([id number]) -------------------------------------------------------------------------------- Multi Wound Chart Details Patient Name: [name] Date of Service: 11/02/2018 8:30 AM Medical Record Number: [id number] Patient Account Number: [id number] Date of Birth/Sex: 1981-05-01 (37 y.o. M) Treating RN: Harold Barban Primary Care Tamatha Gadbois: [name] Other Clinician: Referring Muaad Boehning: [name] Treating Darya Bigler/Extender: Melburn Hake,  [name]: 70 Vital Signs Height(in): 71 Pulse(bpm): 66 Weight(lbs): 338 Blood Pressure(mmHg): 136/76 Body Mass Index(BMI): 47 Temperature(F): 98.6 Respiratory Rate 16 (breaths/min): Photos: [N/A:N/A] Wound Location: Left Lower Leg - Lateral N/A N/A Wounding Event: Gradually Appeared N/A N/A Primary Etiology: Pyoderma N/A N/A Comorbid History: Sleep Apnea, Hypertension, N/A N/A Colitis Date Acquired: 11/18/2015 N/A N/A Weeks of Treatment: 99 N/A N/A Wound Status: Open N/A N/A Measurements L x W x D 2x1.8x0.2 N/A N/A (cm)  Area (cm) : 2.827 N/A N/A Volume (cm) : 0.565 N/A N/A % Reduction in Area: 42.40% N/A N/A % Reduction in Volume: 85.60% N/A N/A Classification: Full Thickness With Exposed N/A N/A Support Structures Exudate Amount: Large N/A N/A Exudate Type: Serous N/A N/A Exudate Color: amber N/A N/A Wound Margin: Distinct, outline attached N/A N/A Granulation Amount: None Present (0%) N/A N/A Necrotic Amount: Large (67-100%) N/A N/A Exposed Structures: Fat Layer (Subcutaneous N/A N/A Tissue) Exposed: Yes Fascia: No Tendon: No Muscle: No Joint: No Bone: No Lipscomb, Hall ([id number]) Epithelialization: None N/A N/A Treatment Notes Electronic Signature(s) Signed: 11/02/2018 3:14:44 PM By: Harold Barban Entered By: Harold Barban on 11/02/2018 09:52:44 [name] ([id number]) -------------------------------------------------------------------------------- Multi-Disciplinary Care Plan Details Patient Name: [name] Date of Service: 11/02/2018 8:30 AM Medical Record Number: [id number] Patient Account Number: [id number] Date of Birth/Sex: 07-09-1980 (37 y.o. M) Treating RN: Harold Barban Primary Care Mamoru Takeshita: [name] Other Clinician: Referring Khamauri Bauernfeind: [name] Treating Darsha Zumstein/Extender: Melburn Hake, [name]: 51 Active Inactive Orientation to the Wound Care Program Nursing Diagnoses: Knowledge deficit  related to the wound healing center program Goals: Patient/caregiver will verbalize understanding of the [facility] Date Initiated: 12/11/2016 Target Resolution Date: 03/13/2017 Goal Status: Active Interventions: Provide education on orientation to the wound center Notes: Venous Leg Ulcer Nursing Diagnoses: Knowledge deficit related to disease process and management Potential for venous Insuffiency (use before diagnosis confirmed) Goals: Non-invasive venous studies are completed as ordered Date Initiated: 12/11/2016 Target Resolution Date: 03/13/2017 Goal Status: Active Patient will maintain optimal edema control Date Initiated: 12/11/2016 Target Resolution Date: 03/13/2017 Goal Status: Active Patient/caregiver will verbalize understanding of disease process and disease management Date Initiated: 12/11/2016 Target Resolution Date: 03/13/2017 Goal Status: Active Verify adequate tissue perfusion prior to therapeutic compression application Date Initiated: 12/11/2016 Target Resolution Date: 03/13/2017 Goal Status: Active Interventions: Assess peripheral edema status every visit. Compression as ordered Provide education on venous insufficiency Treatment Activities: MAHAMUD, METTS ([id number]) Therapeutic compression applied : 12/11/2016 Notes: Wound/Skin Impairment Nursing Diagnoses: Impaired tissue integrity Knowledge deficit related to ulceration/compromised skin integrity Goals: Patient/caregiver will verbalize understanding of skin care regimen Date Initiated: 12/11/2016 Target Resolution Date: 03/13/2017 Goal Status: Active Ulcer/skin breakdown will have a volume reduction of 30% by week 4 Date Initiated: 12/11/2016 Target Resolution Date: 03/13/2017 Goal Status: Active Ulcer/skin breakdown will have a volume reduction of 50% by week 8 Date Initiated: 12/11/2016 Target Resolution Date: 03/13/2017 Goal Status: Active Ulcer/skin breakdown will have a  volume reduction of 80% by week 12 Date Initiated: 12/11/2016 Target Resolution Date: 03/13/2017 Goal Status: Active Ulcer/skin breakdown will heal within 14 weeks Date Initiated: 12/11/2016 Target Resolution Date: 03/13/2017 Goal Status: Active Interventions: Assess patient/caregiver ability to obtain necessary supplies Assess patient/caregiver ability to perform ulcer/skin care regimen upon admission and as needed Assess ulceration(s) every visit Provide education on ulcer and skin care Treatment Activities: Skin care regimen initiated : 12/11/2016 Topical wound management initiated : 12/11/2016 Notes: Electronic Signature(s) Signed: 11/02/2018 3:14:44 PM By: Harold Barban Entered By: Harold Barban on 11/02/2018 09:52:27 [name] ([id number]) -------------------------------------------------------------------------------- Pain Assessment Details Patient Name: [name] Date of Service: 11/02/2018 8:30 AM Medical Record Number: [id number] Patient Account Number: [id number] Date of Birth/Sex: Feb 11, 1981 (37 y.o. M) Treating RN: [name] Primary Care Shanaiya Bene: [name] Other Clinician: Referring Daleigh Pollinger: [name] Treating Akshaj Besancon/Extender: Melburn Hake, [name]: 53 Active Problems Location of Pain Severity and Description of Pain Patient Has Paino No Site Locations With Dressing Change: No Pain  Management and Medication Current Pain Management: Notes Patient has tenderness, no pain. Electronic Signature(s) Signed: 11/02/2018 4:30:53 PM By: [name], [name], RN, [name], [name], [name] Entered By: [name], [name], RN, [name], [name] on 11/02/2018 08:28:49 [name] ([id number]) -------------------------------------------------------------------------------- Patient/Caregiver Education Details Patient Name: [name] Date of Service: 11/02/2018 8:30 AM Medical Record Number: [id number] Patient Account Number: [id number] Date of Birth/Gender: 1980/11/11 (37  y.o. M) Treating RN: Harold Barban Primary Care Physician: [name] Other Clinician: Referring Physician: [name] Treating Physician/Extender: Melburn Hake, [name]: 60 Education Assessment Education Provided To: Patient Education Topics Provided Wound/Skin Impairment: Handouts: Caring for Your Ulcer Methods: Demonstration, Explain/Verbal Responses: State content correctly Electronic Signature(s) Signed: 11/02/2018 3:14:44 PM By: Harold Barban Entered By: Harold Barban on 11/02/2018 09:53:20 [name] ([id number]) -------------------------------------------------------------------------------- Wound Assessment Details Patient Name: [name] Date of Service: 11/02/2018 8:30 AM Medical Record Number: [id number] Patient Account Number: [id number] Date of Birth/Sex: [birth year] (37 y.o. M) Treating RN: [name] Primary Care Dmiya Malphrus: [name] Other Clinician: Referring Coty Larsh: [name] Treating Yiselle Babich/Extender: Melburn Hake, [name]: 99 Wound Status Wound Number: 1 Primary Etiology: Pyoderma Wound Location: Left Lower Leg - Lateral Wound Status: Open Wounding Event: Gradually Appeared Comorbid History: Sleep Apnea, Hypertension, Colitis Date Acquired: 11/18/2015 Weeks Of Treatment: 99 Clustered Wound: No Photos Wound Measurements Length: (cm) 2 % Reduction Width: (cm) 1.8 % Reduction Depth: (cm) 0.2 Epitheliali Area: (cm) 2.827 Tunneling: Volume: (cm) 0.565 Underminin in Area: 42.4% in Volume: 85.6% zation: None No g: No Wound Description Full Thickness With Exposed Support Foul Odor A Classification: Structures Slough/Fibr Wound Margin: Distinct, outline attached Exudate Large Amount: Exudate Type: Serous Exudate Color: amber fter Cleansing: No ino Yes Wound Bed Granulation Amount: None Present (0%) Exposed Structure Necrotic Amount: Large (67-100%) Fascia Exposed: No Necrotic  Quality: Adherent Slough Fat Layer (Subcutaneous Tissue) Exposed: Yes Tendon Exposed: No Muscle Exposed: No Joint Exposed: No Bone Exposed: No Amon, Javyon ([id number]) Treatment Notes Wound #1 (Left, Lateral Lower Leg) Notes Santyl, dry gauze ( to absorb), ABD, tubi grip Electronic Signature(s) Signed: 11/02/2018 4:30:53 PM By: [name], [name], RN, [name], [name], [name] Entered By: [name], [name], RN, [name], [name] on 11/02/2018 08:33:39 [name] ([id number]) -------------------------------------------------------------------------------- Vitals Details Patient Name: [name] Date of Service: 11/02/2018 8:30 AM Medical Record Number: [id number] Patient Account Number: [id number] Date of Birth/Sex: Jun 19, 1980 (37 y.o. M) Treating RN: [name] Primary Care Mkayla Steele: [name] Other Clinician: Referring Gustav Knueppel: [name] Treating Vera Furniss/Extender: Melburn Hake, [name]: 62 Vital Signs Time Taken: 08:28 Temperature (F): 98.6 Height (in): 71 Pulse (bpm): 66 Weight (lbs): 338 Respiratory Rate (breaths/min): 16 Body Mass Index (BMI): 47.1 Blood Pressure (mmHg): 136/76 Reference Range: 80 - 120 mg / dl Electronic Signature(s) Signed: 11/02/2018 4:30:53 PM By: [name], [name], RN, [name], [name], [name] Entered By: [name], [name], RN, [name], [name] on 11/02/2018 08:29:48

## 2018-11-05 NOTE — Progress Notes (Signed)
[name], [name] ([id number]) Visit Report for 11/02/2018 Chief Complaint Document Details Patient Name: [name], [name] Date of Service: 11/02/2018 8:30 AM Medical Record Number: [id number] Patient Account Number: [id number] Date of Birth/Sex: 08-19-1980 (37 y.o. 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 M) Treating RN: Harold Barban Primary Care Provider: [name] Other Clinician: Referring Provider: [name] Treating Provider/Extender: Melburn Hake, Adyn Hoes Weeks in Treatment: 66 Debridement Performed for Wound #1 Left,Lateral Lower Leg Assessment: Performed By: Physician STONE III, Renee Erb E., PA-C Debridement Type: Chemical/Enzymatic/Mechanical Agent Used: Santyl Level of Consciousness (Pre- Awake and Alert procedure): Pre-procedure Verification/Time Yes - 09:30 Out Taken: Start Time: 09:30 Pain Control: Lidocaine Bleeding: None End Time: 09:31 Procedural Pain: 0 Post Procedural Pain: 0 Response to Treatment: Procedure was tolerated well Level of Consciousness Awake and Alert (Post-procedure): Post Debridement Measurements of Total Wound Length: (cm) 2 Width: (cm) 1.8 Depth: (cm) 0.2 Volume: (cm) 0.565 Character of Wound/Ulcer Post Debridement: Improved Post Procedure Diagnosis Same  as Pre-procedure Electronic Signature(s) Signed: 11/02/2018 11:40:28 AM By: Harold Barban Signed: 11/04/2018 6:29:07 AM By: Worthy Keeler PA-C Entered By: Harold Barban on 11/02/2018 11:40:27 [name] ([id number]) -------------------------------------------------------------------------------- HPI Details Patient Name: [name] Date of Service: 11/02/2018 8:30 AM Medical Record Number: [id number] Patient Account Number: [id number] Date of Birth/Sex: 1981/03/23 (37 y.o. M) Treating RN: Harold Barban Primary Care Provider: [name] Other Clinician: Referring Provider: [name] Treating Provider/Extender: Melburn Hake, Lilyian Quayle Weeks in Treatment: 69 History of Present Illness HPI Description: 12/04/16; 38 year old man who comes into the clinic today for review of a wound on the posterior left calf. He tells me that is been there for about a year. He is not a diabetic he does smoke half a pack per day. He was seen in the ER on 11/20/16 felt to have cellulitis around the wound and was given clindamycin. An x-ray did not show osteomyelitis. The patient initially tells me that he has a milk allergy that sets off a pruritic itching rash on his lower legs which she scratches incessantly and he thinks that's what may have set up the wound. He has been using various topical antibiotics and ointments without any effect. He works in a [employer] and is on his feet all day. He does not have a prior history of wounds however he does have the rash on both lower legs the right arm and the ventral aspect of his left arm. These are excoriations and clearly have had scratching however there are of macular looking areas on both legs including a substantial larger area on the right leg. This does not have an underlying open area. There is no blistering. The patient tells me that 2 years ago in [place] in response to the rash on his legs he saw a dermatologist who told him he had a condition  which may be pyoderma gangrenosum although I may be putting words into his mouth. He seemed to recognize this. 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  He is not in any treatment for this. He's had no recent travel 12/11/16; the patient arrives today with his wound and roughly the same condition we've been using silver alginate this is a deep punched out wound with some surrounding erythema but no tenderness. Biopsy I did did not show confirmed pyoderma gangrenosum suggested nonspecific inflammation and vasculitis but does not provide an actual description of what was seen by the pathologist. I'm really not able to understand this We have also received information from the patient's dermatologist in [place] notes from April 2016. This was a doctor Agarwal- antal. 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 He was prescribed topical steroid high potency under occlusion which helped but at this point the patient did not have a deep punched out wound. 12/18/16; the patient's wound is larger in terms of surface area however this surface looks better and there is less depth. The surrounding erythema also is better. The patient states that the wrap we put on came off 2 days ago when he has been using his compression stockings. He we are in the process of getting a dermatology consult. 12/26/16 on evaluation today patient's left lower extremity wound shows evidence of infection with surrounding erythema noted. He has been tolerating the dressing changes but states that he has noted more discomfort. There is a larger area of erythema surrounding the wound. No fevers, chills, nausea, or vomiting noted at this time. With that being said the wound still does have slough covering the surface. He is not allergic to any medication that he is aware of at this point. In regard to his right lower extremity he had several regions that are erythematous and pruritic  he wonders if there's anything we can do to help that. 01/02/17 I reviewed patient's wound culture which was obtained his visit last week. He was placed on doxycycline at that point. Unfortunately that does not appear to be an antibiotic that would likely help with the situation however the pseudomonas noted on culture is sensitive to Cipro. Also unfortunately patient's wound seems to have a large compared to last week's evaluation. Not severely so but there are definitely increased measurements in general. He is continuing to have discomfort as well he writes this to be a seven out of 10. In fact he would prefer me not to perform any debridement today due to the fact that he is having discomfort and considering he has an active infection on the little reluctant to do so anyway. No fevers, chills, nausea, or vomiting noted at this time. 01/08/17; patient seems dermatology on September 5. I suspect dermatology will want the slides from the biopsy I did sent to their pathologist. I'm not sure if there is a way we can expedite that. In any case the culture I did before I left on vacation 3 weeks ago showed Pseudomonas he was given 10 days of Cipro and per her description of her intake nurses is actually somewhat better this week although the wound is quite a bit bigger than I remember the last time I saw this. He still has 3 more days of Cipro [name], [name] ([id number]) 01/21/17; dermatology appointment tomorrow. He has completed the ciprofloxacin for Pseudomonas. Surface of the wound looks better however he is had some deterioration in the lesions on his right leg. Meantime the left lateral leg wound we will continue with sample 01/29/17; patient had his dermatology appointment but I can't yet see that note. He is completed his antibiotics. The  wound is more superficial but considerably larger in circumferential area than when he came in. This is in his left lateral calf. He also has swollen erythematous  areas with superficial wounds on the right leg and small papular areas on both arms. There apparently areas in her his upper thighs and buttocks I did not look at those. Dermatology biopsied the right leg. Hopefully will have their input next week. 02/05/17; patient went back to see his dermatologist who told him that he had a "scratching problem" as well as staph. He is now on a 30 day course of doxycycline and I believe she gave him triamcinolone cream to the right leg areas to help with the itching [not exactly sure but probably triamcinolone]. She apparently looked at the left lateral leg wound although this was not rebiopsied and I think felt to be ultimately part of the same pathogenesis. He is using sample border foam and changing nevus himself. He now has a new open area on the right posterior leg which was his biopsy site I don't have any of the dermatology notes 02/12/17; we put the patient in compression last week with SANTYL to the wound on the left leg and the biopsy. Edema is much better and the depth of the wound is now at level of skin. Area is still the same oBiopsy site on the right lateral leg we've also been using santyl with a border foam dressing and he is changing this himself. 02/19/17; Using silver alginate started last week to both the substantial left leg wound and the biopsy site on the right wound. He is tolerating compression well. Has a an appointment with his primary M.D. tomorrow wondering about diuretics although I'm wondering if the edema problem is actually lymphedema 02/26/17; the patient has been to see his primary doctor Dr. Jerrel Ivory at [place] our primary care. She started him on Lasix 20 mg and this seems to have helped with the edema. However we are not making substantial change with the left lateral calf wound and inflammation. The biopsy site on the right leg also looks stable but not really all that different. 03/12/17; the patient has been to see vein  and vascular Dr. Lucky Cowboy. He has had venous reflux studies I have not reviewed these. I did get a call from his dermatology office. 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 They therefore recommended clobetasol and tetracycline i.e. 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 03/26/17; apparently the patient just had reflux studies not an appointment with Dr. dew. She arrives in clinic today having applied clobetasol for 2-3 weeks. He notes over the last 2-3 days excessive drainage having to change the dressing 3-4 times a day and also expanding erythema. 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 Lillia Carmel at [facility] dermatology locally who kindly saw him at my request. 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 My concern was the degree of  erythema medially extending from his left leg wound which was either inflammation from pyoderma or cellulitis. I put him on Augmentin however culture of the wound showed Pseudomonas which is quinolone sensitive. I really don't believe he has cellulitis however in view of everything I will continue and give him a course of Cipro. He is also on doxycycline as an immune modulator for the pyoderma. In addition to his original wound on the left lateral leg with surrounding erythema he has a wound on the right posterior calf which was an original biopsy site done by dermatology. This  was felt to represent pathergy from pyoderma gangrenosum 04/16/17; pyoderma gangrenosum. Saw Dr. Lillia Carmel yesterday. He has been using topical antibiotics to both wound areas his original wound on the left and the biopsies/pathergy area on the right. There is definitely some improvement in the inflammation around the wound on the right although the patient states he has increasing sensitivity of the wounds. He is on prednisone 60 and doxycycline 1 as prescribed by Dr. Lillia Carmel. He is covering the topical antibiotic with gauze and putting this in his own compression stocks and changing this daily. He states that Dr. Lottie Rater did a culture of the left leg wound yesterday 05/07/17; pyoderma gangrenosum. The patient saw Dr. Lillia Carmel yesterday and has a follow-up with her in one month. He is still using topical antibiotics to both wounds although he can't recall exactly what type. He is still on prednisone 60 mg. Dr. Lillia Carmel stated that the doxycycline could stop if we were in agreement. 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 The right medial leg was induced by biopsy/pathergy. The area on the right is essentially healed. 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 The right medial leg has closed and remains closed. He is still on prednisone 60. oHe tells me he missed his last dermatology appointment with Dr. Lillia Carmel but will make another appointment. He reports that her blood sugar at a recent screen in [place] was high 200's. He was 180 today. He is more cushingoid blood pressure is Hornback, Yuan ([id number]) up a bit. I think he is going to require still much longer prednisone perhaps another 3 months before attempting to taper. In the meantime his wound is a lot better. Smaller. He is  cleaning this off daily and applying topical antibiotics. When he was last in the clinic I thought about changing to [facility] and actually put in a couple of calls to dermatology although probably not during their business hours. 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 He continues on oral prednisone. He has been using triple antibiotic ointment. There is surface debris and we will transition to [facility] and have him return in 2 weeks. He has lost 30 pounds since his last appointment with lifestyle modification. He may benefit from topical steroid cream for treatment this can be considered at a later date. 08/22/17 on evaluation today patient appears to actually be doing rather well in regard to his left lateral lower extremity ulcer. He has actually been managed by Dr. Dellia Nims most recently. Patient is currently on oral steroids at this time. This seems to have been of benefit for him. Nonetheless his  last visit was actually with Leah on 08/06/17. Currently he is not utilizing any topical steroid creams although this could be of benefit as well. No fevers, chills, nausea, or vomiting noted at this time. 09/05/17 on evaluation today patient appears to be doing better in regard to his left lateral lower extremity ulcer. He has been tolerating the dressing changes without complication. He is using Santyl with good effect. Overall I'm very pleased with how things are standing at this point. Patient likewise is happy that this is doing better. 09/19/17 on evaluation today patient actually appears to be doing rather well in regard to his left lateral lower extremity ulcer. 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 He's not having any significant pain. 10/03/17 on evaluation today patient appears to be doing  excellent in regard to his lower extremity wound on the left secondary to Pyoderma gangrenosum. He has been tolerating the Santyl without complication and in general I feel like he's making good progress. 10/17/17 on evaluation today patient appears to be doing very well in regard to his left lateral lower surety ulcer. He has been tolerating the dressing changes without complication. There does not appear to be any evidence of infection he's alternating the Santyl and the triple antibiotic ointment every other day this seems to be doing well for him. 11/03/17 on evaluation today patient appears to be doing very well in regard to his left lateral lower extremity ulcer. He is been tolerating the dressing changes without complication which is good news. Fortunately there does not appear to be any evidence of infection which is also great news. Overall is doing excellent they are starting to taper down on the prednisone is down to 40 mg at this point it also started topical clobetasol for him. 11/17/17 on evaluation today patient appears to be doing well in regard to his left lateral lower surety ulcer. He's been tolerating the dressing changes without complication. He does note that he is having no pain, no excessive drainage or discharge, and overall he feels like things are going about how he would expect and hope they would. Overall he seems to have no evidence of infection at this time in my opinion which is good news. 12/04/17-He is seen in follow-up evaluation for right lateral lower extremity ulcer. He has been applying topical steroid cream. Today's measurement show slight increase in size. Over the next 2 weeks we will transition to every other day Santyl and steroid cream. He has been encouraged to monitor for changes and notify clinic with any concerns 12/15/17 on evaluation today patient's left lateral motion the ulcer and fortunately is doing worse again at this point. This just since last  week to this week has close to doubled in size according to the patient. I did not seeing last week's I do not have a visual to compare this to in our system was also down so we do not have all the charts and at this point. Nonetheless it does have me somewhat concerned in regard to the fact that again he was worried enough about it he has contact the dermatology that placed them back on the full strength, 50 mg a day of the prednisone that he was taken previous. He continues to alternate using clobetasol along with Santyl at this point. He is obviously somewhat frustrated. 12/22/17 on evaluation today patient appears to be doing a little worse compared to last  evaluation. Unfortunately the wound is a little deeper and slightly larger than the last week's evaluation. With that being said he has made some progress in regard to the irritation surrounding at this time unfortunately despite that progress that's been made he still has a significant issue going on here. 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 For that reason I discussed with him today the possibility of perform a wound culture to ensure there's nothing overtly infected. 01/06/18 on evaluation today patient's wound is larger and deeper than previously evaluated. With that being said it did appear that his wound was infected after my last evaluation with him. Subsequently I did end up prescribing a prescription for Bactrim DS which she has been taking and having no complication with. Fortunately there does not appear to be any evidence of Matar, Emmette ([id number]) infection at this point in time as far as anything spreading, no want to touch, and overall I feel like things are showing signs of improvement. 01/13/18 on evaluation today patient appears to be even a little larger and deeper than  last time. There still muscle exposed in the base of the wound. Nonetheless he does appear to be less erythematous I do believe inflammation is calming down also believe the infection looks like it's probably resolved at this time based on what I'm seeing. No fevers, chills, nausea, or vomiting noted at this time. 01/30/18 on evaluation today patient actually appears to visually look better for the most part. Unfortunately those visually this looks better he does seem to potentially have what may be an abscess in the muscle that has been noted in the central portion of the wound. This is the first time that I have noted what appears to be fluctuance in the central portion of the muscle. With that being said I'm somewhat more concerned about the fact that this might indicate an abscess formation at this location. I do believe that an ultrasound would be appropriate. This is likely something we need to try to do as soon as possible. He has been switch to mupirocin ointment and he is no longer using the steroid ointment as prescribed by dermatology he sees them again next week he's been decreased from 60 to 40 mg of prednisone. 03/09/18 on evaluation today patient actually appears to be doing a little better compared to last time I saw him. There's not as much erythema surrounding the wound itself. He I did review his most recent infectious disease note which was dated 02/24/18. He saw Dr. Michel Bickers in [place]. 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 Patient is now off of antibiotics and they are continually observing this. There seems to be no change in the past two weeks in my pinion based on what the patient says and what I see today compared to what Dr. Megan Salon likely saw two weeks ago. No fevers, chills, nausea, or vomiting noted at this time. 03/23/18 on evaluation today patient's wound actually  appears to be showing signs of improvement which is good news. He is currently still on the Dapsone. He is also working on tapering the prednisone to get off of this and Dr. Lottie Rater is working with him in this regard. Nonetheless overall I feel like the wound is doing well  it does appear based on the infectious disease note that I reviewed from Dr. 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 I did also review the results of his ultrasound of left lower extremity which revealed there was a dentist tissue in the base of the wound without an abscess noted. 04/06/18 on evaluation today the patient's left lateral lower extremity ulcer actually appears to be doing fairly well which is excellent news. There does not appear to be any evidence of infection at this time which is also great news. Overall he still does have a significantly large ulceration although little by little he seems to be making progress. He is down to 10 mg a day of the prednisone. 04/20/18 on evaluation today patient actually appears to be doing excellent at this time in regard to his left lower extremity ulcer. He's making signs of good progress unfortunately this is taking much longer than we would really like to see but nonetheless he is making progress. Fortunately there does not appear to be any evidence of infection at this time. No fevers, chills, nausea, or vomiting noted at this time. The patient has not been using the Santyl due to the cost he hadn't got in this field yet. He's mainly been using the antibiotic ointment topically. Subsequently he also tells me that he really has not been scrubbing in the shower I think this would be helpful again as I told him it doesn't have to be anything too aggressive to even make it believe just enough to keep it free of some of the loose slough and biofilm on the wound  surface. 05/11/18 on evaluation today patient's wound appears to be making slow but sure progress in regard to the left lateral lower extremity ulcer. He is been tolerating the dressing changes without complication. Fortunately there does not appear to be any evidence of infection at this time. He is still just using triple antibiotic ointment along with clobetasol occasionally over the area. He never got the Santyl and really does not seem to intend to in my pinion. 06/01/18 on evaluation today patient appears to be doing a little better in regard to his left lateral lower extremity ulcer. He states that overall he does not feel like he is doing as well with the Dapsone as he did with the prednisone. Nonetheless he sees his dermatologist later today and is gonna talk to them about the possibility of going back on the prednisone. Overall again I believe that the wound would be better if you would utilize Santyl but he really does not seem to be interested in going back to the [place] at this point. He has been using triple antibiotic ointment. 06/15/18 on evaluation today patient's wound actually appears to be doing about the same at this point. Fortunately there is no signs of infection at this time. He has made slight improvements although he continues to not really want to clean the wound bed at this point. He states that he just doesn't mess with it he doesn't want to cause any problems with everything else he has going on. He has been on medication, antibiotics as prescribed by his dermatologist, for a staff infection of his lower extremities which is really drying out now and looking much better he tells me. Fortunately there is no sign of overall infection. [name], [name] ([id number]) 06/29/18 on evaluation today patient appears  to be doing well in regard to his left lateral lower surety ulcer all things considering. Fortunately his staff infection seems to be greatly improved compared to previous.  He has no signs of infection and this is drying up quite nicely. He is still the doxycycline for this is no longer on cental, Dapsone, or any of the other medications. His dermatologist has recommended possibility of an infusion but right now he does not want to proceed with that. 07/13/18 on evaluation today patient appears to be doing about the same in regard to his left lateral lower surety ulcer. Fortunately there's no signs of infection at this time which is great news. Unfortunately he still builds up a significant amount of Slough/biofilm of the surface of the wound he still is not really cleaning this as he should be appropriately. Again I'm able to easily with saline and gauze remove the majority of this on the surface which if you would do this at home would likely be a dramatic improvement for him as far as getting the area to improve. Nonetheless overall I still feel like he is making progress is just very slow. I think Santyl will be of benefit for him as well. Still he has not gotten this as of this point. 07/27/18 on evaluation today patient actually appears to be doing little worse in regards of the erythema around the periwound region of the wound he also tells me that he's been having more drainage currently compared to what he was experiencing last time I saw him. He states not quite as bad as what he had because this was infected previously but nonetheless is still appears to be doing poorly. Fortunately there is no evidence of systemic infection at this point. The patient tells me that he is not going to be able to afford the Santyl. He is still waiting to hear about the infusion therapy with his dermatologist. Apparently she wants an updated colonoscopy first. 08/10/18 on evaluation today patient appears to be doing better in regard to his left lateral lower extremity ulcer. Fortunately he is showing signs of improvement in this regard he's actually been approved for Remicade  infusion's as well although this has not been scheduled as of yet. Fortunately there's no signs of active infection at this time in regard to the wound although he is having some issues with infection of the right lower extremity is been seen as dermatologist for this. Fortunately they are definitely still working with him trying to keep things under control. 09/07/18 on evaluation today patient is actually doing rather well in regard to his left lateral lower extremity ulcer. He notes these actually having some hair grow back on his extremity which is something he has not seen in years. He also tells me that the pain is really not giving them any trouble at this time which is also good news overall she is very pleased with the progress he's using a combination of the mupirocin along with the probate is all mixed. 09/21/18 on evaluation today patient actually appears to be doing fairly well all things considered in regard to his looks from the ulcer. He's been tolerating the dressing changes without complication. Fortunately there's no signs of active infection at this time which is good news he is still on all antibiotics or prevention of the staff infection. He has been on prednisone for time although he states it is gonna contact his dermatologist and see if she put them on a short course due  to some irritation that he has going on currently. Fortunately there's no evidence of any overall worsening this is going very slow I think cental would be something that would be helpful for him although he states that $50 for tube is quite expensive. He therefore is not willing to get that at this point. 10/06/18 on evaluation today patient actually appears to be doing decently well in regard to his left lateral leg ulcer. He's been tolerating the dressing changes without complication. Fortunately there's no signs of active infection at this time. Overall I'm actually rather pleased with the progress he's  making although it's slow he doesn't show any signs of infection and he does seem to be making some improvement. I do believe that he may need a switch up and dressings to try to help this to heal more appropriately and quickly. 10/19/18 on evaluation today patient actually appears to be doing better in regard to his left lateral lower extremity ulcer. This is shown signs of having much less Slough buildup at this point due to the fact he has been using the [employer]. Obviously this is very good news. The overall size of the wound is not dramatically smaller but again the appearance is. 11/02/18 on evaluation today patient actually appears to be doing quite well in regard to his lower [place] ulcer. A lot of the skin around the ulcer is actually somewhat irritating at this point this seems to be more due to the dressing causing irritation from the adhesive that anything else. Fortunately there is no signs of active infection at this time. Electronic Signature(s) Signed: 11/04/2018 6:29:07 AM By: Worthy Keeler PA-C Entered By: Worthy Keeler on 11/02/2018 23:30:54 [name], [name] ([id number]) -------------------------------------------------------------------------------- Physical Exam Details Patient Name: [name] Date of Service: 11/02/2018 8:30 AM Medical Record Number: [id number] Patient Account Number: [id number] Date of Birth/Sex: 08-05-1980 (37 y.o. M) Treating RN: Harold Barban Primary Care Provider: [name] Other Clinician: Referring Provider: [name] Treating Provider/Extender: Melburn Hake, Greco Gastelum Weeks in Treatment: 82 Constitutional Well-nourished and well-hydrated in no acute distress. Respiratory normal breathing without difficulty. clear to auscultation bilaterally. Cardiovascular regular rate and rhythm with normal S1, S2. Psychiatric this patient is able to make decisions and demonstrates good insight into disease process. Alert and Oriented x 3.  pleasant and cooperative. Notes Patient's wound bed again showed signs of Slough in the surface of the wound here just Santyl that does seem to be helping to soften this up to some degree. Subsequently I feel like he is making good progress it's just very slow. I do believe that the irritation around the world is not due to infection although it is read and erythematous point of hearing I think this is what their attention from the dressing itself. Electronic Signature(s) Signed: 11/04/2018 6:29:07 AM By: Worthy Keeler PA-C Entered By: Worthy Keeler on 11/02/2018 23:31:29 [name] ([id number]) -------------------------------------------------------------------------------- Physician Orders Details Patient Name: [name] Date of Service: 11/02/2018 8:30 AM Medical Record Number: [id number] Patient Account Number: [id number] Date of Birth/Sex: 01/05/1981 (37 y.o. 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 Anesthetic (add to Medication List) Wound #1  Left,Lateral Lower Leg o Topical Lidocaine 4% cream applied to wound bed prior to debridement (In Clinic Only). Primary Wound Dressing Wound #1 Left,Lateral Lower Leg o Santyl Ointment - dry gauze to help absorb Secondary Dressing Wound #1 Left,Lateral Lower Leg o ABD pad - Tubi Grip to hold o Conform/Kerlix Dressing Change Frequency Wound #1 Left,Lateral Lower Leg o Change dressing every day. Follow-up Appointments Wound #1 Left,Lateral Lower Leg o Return  Appointment in 2 weeks. Edema Control Wound #1 Left,Lateral Lower Leg o Elevate legs to the level of the heart and pump ankles as often as possible Medications-please add to medication list. Wound #1 Left,Lateral Lower Leg o Santyl Enzymatic Ointment HULBERT, BRANSCOME ([id number]) Electronic Signature(s) Signed: 11/02/2018 3:14:44 PM By: Harold Barban Signed: 11/04/2018 6:29:07 AM By: Worthy Keeler PA-C Entered By: Harold Barban on 11/02/2018 09:57:00 [name] ([id number]) -------------------------------------------------------------------------------- Problem List Details Patient Name: [name] Date of Service: 11/02/2018 8:30 AM Medical Record Number: [id number] Patient Account Number: [id number] Date of Birth/Sex: 10-Oct-1980 (37 y.o. M) Treating RN: Harold Barban Primary Care Provider: [name] Other Clinician: Referring Provider: [name] Treating Provider/Extender: Melburn Hake, Raylyn Carton Weeks in Treatment: 39 Active Problems ICD-10 Evaluated Encounter Code Description Active Date Today Diagnosis L97.222 Non-pressure chronic ulcer of left calf with fat layer exposed 12/04/2016 No Yes L88 Pyoderma gangrenosum 03/26/2017 No Yes I87.2 Venous insufficiency (chronic) (peripheral) 12/04/2016 No Yes Inactive Problems ICD-10 Code Description Active Date Inactive Date L97.213 Non-pressure chronic ulcer of right calf with necrosis of muscle 04/02/2017 04/02/2017 Resolved Problems Electronic Signature(s) Signed: 11/04/2018 6:29:07 AM By: Worthy Keeler PA-C Entered By: Worthy Keeler on 11/02/2018 09:17:38 [name], [name] ([id number]) -------------------------------------------------------------------------------- Progress Note Details Patient Name: [name] Date of Service: 11/02/2018 8:30 AM Medical Record Number: [id number] Patient Account Number: [id number] Date of Birth/Sex: 27-Sep-1980 (37 y.o. M) Treating RN: Harold Barban Primary Care Provider: [name] Other Clinician: Referring Provider: [name] Treating Provider/Extender: Melburn Hake, Linnie Delgrande Weeks in Treatment: 9 Subjective Chief Complaint Information obtained from Patient He is here in follow up evaluation for LLE pyoderma ulcer History of Present Illness (HPI) 12/04/16; 38 year old man who comes into the clinic today for review of a wound on the posterior left calf. He tells me that is been there for about a year. He is not a diabetic he does smoke half a pack per day. He was seen in the ER on 11/20/16 felt to have cellulitis around the wound and was given clindamycin. An x-ray did not show osteomyelitis. The patient initially tells me that he has a milk allergy that sets off a pruritic itching rash on his lower legs which she scratches incessantly and he thinks that's what may have set up the wound. He has been using various topical antibiotics and ointments without any effect. He works in a [employer] and is on his feet all day. He does not have a prior history of wounds however he does have the rash on both lower legs the right arm and the ventral aspect of his left arm. These are excoriations and clearly have had scratching however there are of macular looking areas on both legs including a substantial larger area on the right leg. This does not have an underlying open area. There is no blistering. The patient tells me that 2 years ago in [place] in response to the rash on his legs he saw a dermatologist who told him he had a condition which may be pyoderma gangrenosum although I may be putting  words into his mouth. He seemed to recognize this. 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 He is not in any treatment for this. He's had no recent travel 12/11/16; the patient arrives today with his wound and roughly the same condition we've been using silver alginate this is a deep punched out wound with some surrounding erythema but  no tenderness. Biopsy I did did not show confirmed pyoderma gangrenosum suggested nonspecific inflammation and vasculitis but does not provide an actual description of what was seen by the pathologist. I'm really not able to understand this We have also received information from the patient's dermatologist in [place] notes from April 2016. This was a doctor Agarwal- antal. 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 He was prescribed topical steroid high potency under occlusion which helped but at this point the patient did not have a deep punched out wound. 12/18/16; the patient's wound is larger in terms of surface area however this surface looks better and there is less depth. The surrounding erythema also is better. The patient states that the wrap we put on came off 2 days ago when he has been using his compression stockings. He we are in the process of getting a dermatology consult. 12/26/16 on evaluation today patient's left lower extremity wound shows evidence of infection with surrounding erythema noted. He has been tolerating the dressing changes but states that he has noted more discomfort. There is a larger area of erythema surrounding the wound. No fevers, chills, nausea, or vomiting noted at this time. With that being said the wound still does have slough covering the surface. He is not allergic to any medication that he is aware of at this point. In regard to his right lower extremity he had several regions that are erythematous and pruritic he wonders if there's anything we can do to help that. 01/02/17 I reviewed patient's wound culture which was obtained his visit last week. He was placed on doxycycline at that point. Unfortunately that does not appear to be an antibiotic that would likely help with the situation however the pseudomonas noted on culture is sensitive to Cipro. Also unfortunately patient's wound seems to have a large compared to last week's evaluation. Not  severely so but there are definitely increased measurements in general. He is continuing to have discomfort as well he writes this to be a seven out of 10. In fact he would prefer me not to perform any debridement today due to the fact that he is having discomfort and considering he has an active infection on the little reluctant to do so anyway. No fevers, Gunnarson, Luismario ([id number]) chills, nausea, or vomiting noted at this time. 01/08/17; patient seems dermatology on September 5. I suspect dermatology will want the slides from the biopsy I did sent to their pathologist. I'm not sure if there is a way we can expedite that. In any case the culture I did before I left on vacation 3 weeks ago showed Pseudomonas he was given 10 days of Cipro and per her description of her intake nurses is actually somewhat better this week although the wound is quite a bit bigger than I remember the last time I saw this. He still has 3 more days of Cipro 01/21/17; dermatology appointment tomorrow. He has completed the ciprofloxacin for Pseudomonas. Surface of the wound looks better however he is had some deterioration in the lesions on his right leg. Meantime the left lateral leg wound  we will continue with sample 01/29/17; patient had his dermatology appointment but I can't yet see that note. He is completed his antibiotics. The wound is more superficial but considerably larger in circumferential area than when he came in. This is in his left lateral calf. He also has swollen erythematous areas with superficial wounds on the right leg and small papular areas on both arms. There apparently areas in her his upper thighs and buttocks I did not look at those. Dermatology biopsied the right leg. Hopefully will have their input next week. 02/05/17; patient went back to see his dermatologist who told him that he had a "scratching problem" as well as staph. He is now on a 30 day course of doxycycline and I believe she gave him  triamcinolone cream to the right leg areas to help with the itching [not exactly sure but probably triamcinolone]. She apparently looked at the left lateral leg wound although this was not rebiopsied and I think felt to be ultimately part of the same pathogenesis. He is using sample border foam and changing nevus himself. He now has a new open area on the right posterior leg which was his biopsy site I don't have any of the dermatology notes 02/12/17; we put the patient in compression last week with SANTYL to the wound on the left leg and the biopsy. Edema is much better and the depth of the wound is now at level of skin. Area is still the same Biopsy site on the right lateral leg we've also been using santyl with a border foam dressing and he is changing this himself. 02/19/17; Using silver alginate started last week to both the substantial left leg wound and the biopsy site on the right wound. He is tolerating compression well. Has a an appointment with his primary M.D. tomorrow wondering about diuretics although I'm wondering if the edema problem is actually lymphedema 02/26/17; the patient has been to see his primary doctor Dr. Jerrel Ivory at [facility] our primary care. She started him on Lasix 20 mg and this seems to have helped with the edema. However we are not making substantial change with the left lateral calf wound and inflammation. The biopsy site on the right leg also looks stable but not really all that different. 03/12/17; the patient has been to see vein and vascular Dr. Lucky Cowboy. He has had venous reflux studies I have not reviewed these. I did get a call from his dermatology office. 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  They therefore recommended clobetasol and tetracycline i.e. 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 03/26/17; apparently the patient just had reflux studies not an appointment with Dr. dew. She arrives in clinic today having applied clobetasol for 2-3 weeks. He notes over the last 2-3 days excessive drainage having to change the dressing 3-4 times a day and also expanding erythema. 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 Lillia Carmel at [facility] dermatology locally who kindly saw him at  my request. 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 My concern was the degree of erythema medially extending from his left leg wound which was either inflammation from pyoderma or cellulitis. I put him on Augmentin however culture of the wound showed Pseudomonas which is quinolone sensitive. I really don't believe he has cellulitis however in view of everything I will continue and give him a course of Cipro. He is also on doxycycline as an immune modulator for the pyoderma. In addition to his original wound on the left lateral leg with surrounding erythema he has a wound on the right posterior calf which was an original biopsy site done by dermatology. 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 Saw Dr. Lillia Carmel yesterday. He has been using topical antibiotics to both wound areas his original wound on the left and the biopsies/pathergy area on the right. There is definitely some improvement in the inflammation around the wound on the right although the patient states he has increasing sensitivity of the wounds. He is on prednisone 60 and  doxycycline 1 as prescribed by Dr. Lillia Carmel. He is covering the topical antibiotic with gauze and putting this in his own compression stocks and changing this daily. He states that Dr. Lottie Rater did a culture of the left leg wound yesterday 05/07/17; pyoderma gangrenosum. The patient saw Dr. Lillia Carmel yesterday and has a follow-up with her in one month. He is still using topical antibiotics to both wounds although he can't recall exactly what type. He is still on prednisone 60 mg. Dr. Lillia Carmel stated that the doxycycline could stop if we were in agreement. 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 The right medial leg was induced by [name] ([id number]) biopsy/pathergy. The area on the right is essentially healed. 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 The right medial leg has closed and remains closed. He is still on prednisone 60. He tells me he missed his last dermatology appointment with Dr. Lillia Carmel but will make another appointment. He reports that her blood sugar at a recent screen in [place] was high 200's. He was 180 today. 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 I think he is going to require still much longer prednisone perhaps another 3 months before attempting to taper. In the meantime his wound is a lot better. Smaller. He is cleaning this off daily and applying topical antibiotics. When he was last in the clinic I thought about changing to [facility] and actually put in a couple of calls to dermatology although probably not during their business hours. 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 He continues on oral prednisone. He has  been using triple antibiotic ointment. There is surface debris and we will transition to [facility] and have him return in 2 weeks. He has lost 30 pounds since his last appointment with lifestyle modification. He may benefit from topical steroid cream for treatment this can be considered at a later date. 08/22/17 on evaluation today patient appears to actually be doing rather well in regard to his left lateral lower extremity ulcer. He has actually been managed by  Dr. Dellia Nims most recently. Patient is currently on oral steroids at this time. This seems to have been of benefit for him. Nonetheless his last visit was actually with Leah on 08/06/17. Currently he is not utilizing any topical steroid creams although this could be of benefit as well. No fevers, chills, nausea, or vomiting noted at this time. 09/05/17 on evaluation today patient appears to be doing better in regard to his left lateral lower extremity ulcer. He has been tolerating the dressing changes without complication. He is using Santyl with good effect. Overall I'm very pleased with how things are standing at this point. Patient likewise is happy that this is doing better. 09/19/17 on evaluation today patient actually appears to be doing rather well in regard to his left lateral lower extremity ulcer. 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 He's not having any significant pain. 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 He has been tolerating the Santyl without complication and in general I feel like he's making good progress. 10/17/17 on evaluation today patient appears to be doing very well in regard to his left lateral lower surety ulcer. He has been tolerating the dressing changes without complication. There does not appear to be any evidence of infection  he's alternating the Santyl and the triple antibiotic ointment every other day this seems to be doing well for him. 11/03/17 on evaluation today patient appears to be doing very well in regard to his left lateral lower extremity ulcer. He is been tolerating the dressing changes without complication which is good news. Fortunately there does not appear to be any evidence of infection which is also great news. Overall is doing excellent they are starting to taper down on the prednisone is down to 40 mg at this point it also started topical clobetasol for him. 11/17/17 on evaluation today patient appears to be doing well in regard to his left lateral lower surety ulcer. He's been tolerating the dressing changes without complication. He does note that he is having no pain, no excessive drainage or discharge, and overall he feels like things are going about how he would expect and hope they would. Overall he seems to have no evidence of infection at this time in my opinion which is good news. 12/04/17-He is seen in follow-up evaluation for right lateral lower extremity ulcer. He has been applying topical steroid cream. Today's measurement show slight increase in size. Over the next 2 weeks we will transition to every other day Santyl and steroid cream. He has been encouraged to monitor for changes and notify clinic with any concerns 12/15/17 on evaluation today patient's left lateral motion the ulcer and fortunately is doing worse again at this point. This just since last week to this week has close to doubled in size according to the patient. I did not seeing last week's I do not have a visual to compare this to in our system was also down so we do not have all the charts and at this point. Nonetheless it does have me somewhat concerned in regard to the fact that again he was worried enough about it he has contact the dermatology that placed them back on the full strength, 50 mg a day of the prednisone that  he was taken previous. He continues to alternate using clobetasol along with  Santyl at this point. He is obviously somewhat frustrated. 12/22/17 on evaluation today patient appears to be doing a little worse compared to last evaluation. Unfortunately the wound is a little deeper and slightly larger than the last week's evaluation. With that being said he has made some progress in regard to the irritation surrounding at this time unfortunately despite that progress that's been made he still has a significant issue going on here. I'm not certain that he is having really any true infection at this time although with the Pyoderma gangrenosum it can [name], [name] ([id number]) sometimes be difficult to differentiate infection versus just inflammation. For that reason I discussed with him today the possibility of perform a wound culture to ensure there's nothing overtly infected. 01/06/18 on evaluation today patient's wound is larger and deeper than previously evaluated. With that being said it did appear that his wound was infected after my last evaluation with him. Subsequently I did end up prescribing a prescription for Bactrim DS which she has been taking and having no complication with. Fortunately there does not appear to be any evidence of infection at this point in time as far as anything spreading, no want to touch, and overall I feel like things are showing signs of improvement. 01/13/18 on evaluation today patient appears to be even a little larger and deeper than last time. There still muscle exposed in the base of the wound. Nonetheless he does appear to be less erythematous I do believe inflammation is calming down also believe the infection looks like it's probably resolved at this time based on what I'm seeing. No fevers, chills, nausea, or vomiting noted at this time. 01/30/18 on evaluation today patient actually appears to visually look better for the most part. Unfortunately those visually  this looks better he does seem to potentially have what may be an abscess in the muscle that has been noted in the central portion of the wound. This is the first time that I have noted what appears to be fluctuance in the central portion of the muscle. With that being said I'm somewhat more concerned about the fact that this might indicate an abscess formation at this location. I do believe that an ultrasound would be appropriate. This is likely something we need to try to do as soon as possible. He has been switch to mupirocin ointment and he is no longer using the steroid ointment as prescribed by dermatology he sees them again next week he's been decreased from 60 to 40 mg of prednisone. 03/09/18 on evaluation today patient actually appears to be doing a little better compared to last time I saw him. There's not as much erythema surrounding the wound itself. He I did review his most recent infectious disease note which was dated 02/24/18. He saw Dr. Michel Bickers in [place]. 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 Patient is now off of antibiotics and they are continually observing this. There seems to be no change in the past two weeks in my pinion based on what the patient says and what I see today compared to what Dr. Megan Salon likely saw two weeks ago. No fevers, chills, nausea, or vomiting noted at this time. 03/23/18 on evaluation today patient's wound actually appears to be showing signs of improvement which is good news. He is currently still on the Dapsone. He is also working on tapering the prednisone to get  off of this and Dr. Lottie Rater is working with him in this regard. Nonetheless overall I feel like the wound is doing well it does appear based on the infectious disease note that I reviewed from Dr. 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 I did also review the results of his ultrasound of left lower extremity which revealed there was a dentist tissue in the base of the wound without an abscess noted. 04/06/18 on evaluation today the patient's left lateral lower extremity ulcer actually appears to be doing fairly well which is excellent news. There does not appear to be any evidence of infection at this time which is also great news. Overall he still does have a significantly large ulceration although little by little he seems to be making progress. He is down to 10 mg a day of the prednisone. 04/20/18 on evaluation today patient actually appears to be doing excellent at this time in regard to his left lower extremity ulcer. He's making signs of good progress unfortunately this is taking much longer than we would really like to see but nonetheless he is making progress. Fortunately there does not appear to be any evidence of infection at this time. No fevers, chills, nausea, or vomiting noted at this time. The patient has not been using the Santyl due to the cost he hadn't got in this field yet. He's mainly been using the antibiotic ointment topically. Subsequently he also tells me that he really has not been scrubbing in the shower I think this would be helpful again as I told him it doesn't have to be anything too aggressive to even make it believe just enough to keep it free of some of the loose slough and biofilm on the wound surface. 05/11/18 on evaluation today patient's wound appears to be making slow but sure progress in regard to the left lateral lower extremity ulcer. He is been tolerating the dressing changes without complication. Fortunately there does not appear to be any evidence of infection at this time. He is still just using triple antibiotic ointment along with clobetasol occasionally over the area. He never got the Santyl and really does not seem to  intend to in my pinion. 06/01/18 on evaluation today patient appears to be doing a little better in regard to his left lateral lower extremity ulcer. He states that overall he does not feel like he is doing as well with the Dapsone as he did with the prednisone. Nonetheless he sees his dermatologist later today and is gonna talk to them about the possibility of going back on the prednisone. Overall again I believe that the wound would be better if you would utilize Santyl but he really does not seem to be interested in going back to the [place] at this point. He has been using triple antibiotic ointment. [name], [name] ([id number]) 06/15/18 on evaluation today patient's wound actually appears to be doing about the same at this point. Fortunately there is no signs of infection at this time. He has made slight improvements although he continues to not really want to clean the wound bed at this point. He states that he just doesn't mess with it he doesn't want to cause any problems with everything else he has going on. He has been on medication, antibiotics as prescribed by his dermatologist, for a staff infection of his lower extremities which is really  drying out now and looking much better he tells me. Fortunately there is no sign of overall infection. 06/29/18 on evaluation today patient appears to be doing well in regard to his left lateral lower surety ulcer all things considering. Fortunately his staff infection seems to be greatly improved compared to previous. He has no signs of infection and this is drying up quite nicely. He is still the doxycycline for this is no longer on cental, Dapsone, or any of the other medications. His dermatologist has recommended possibility of an infusion but right now he does not want to proceed with that. 07/13/18 on evaluation today patient appears to be doing about the same in regard to his left lateral lower surety ulcer. Fortunately there's no signs of infection  at this time which is great news. Unfortunately he still builds up a significant amount of Slough/biofilm of the surface of the wound he still is not really cleaning this as he should be appropriately. Again I'm able to easily with saline and gauze remove the majority of this on the surface which if you would do this at home would likely be a dramatic improvement for him as far as getting the area to improve. Nonetheless overall I still feel like he is making progress is just very slow. I think Santyl will be of benefit for him as well. Still he has not gotten this as of this point. 07/27/18 on evaluation today patient actually appears to be doing little worse in regards of the erythema around the periwound region of the wound he also tells me that he's been having more drainage currently compared to what he was experiencing last time I saw him. He states not quite as bad as what he had because this was infected previously but nonetheless is still appears to be doing poorly. Fortunately there is no evidence of systemic infection at this point. The patient tells me that he is not going to be able to afford the Santyl. He is still waiting to hear about the infusion therapy with his dermatologist. Apparently she wants an updated colonoscopy first. 08/10/18 on evaluation today patient appears to be doing better in regard to his left lateral lower extremity ulcer. Fortunately he is showing signs of improvement in this regard he's actually been approved for Remicade infusion's as well although this has not been scheduled as of yet. Fortunately there's no signs of active infection at this time in regard to the wound although he is having some issues with infection of the right lower extremity is been seen as dermatologist for this. Fortunately they are definitely still working with him trying to keep things under control. 09/07/18 on evaluation today patient is actually doing rather well in regard to his left  lateral lower extremity ulcer. He notes these actually having some hair grow back on his extremity which is something he has not seen in years. He also tells me that the pain is really not giving them any trouble at this time which is also good news overall she is very pleased with the progress he's using a combination of the mupirocin along with the probate is all mixed. 09/21/18 on evaluation today patient actually appears to be doing fairly well all things considered in regard to his looks from the ulcer. He's been tolerating the dressing changes without complication. Fortunately there's no signs of active infection at this time which is good news he is still on all antibiotics or prevention of the staff infection. He has been  on prednisone for time although he states it is gonna contact his dermatologist and see if she put them on a short course due to some irritation that he has going on currently. Fortunately there's no evidence of any overall worsening this is going very slow I think cental would be something that would be helpful for him although he states that $50 for tube is quite expensive. He therefore is not willing to get that at this point. 10/06/18 on evaluation today patient actually appears to be doing decently well in regard to his left lateral leg ulcer. He's been tolerating the dressing changes without complication. Fortunately there's no signs of active infection at this time. Overall I'm actually rather pleased with the progress he's making although it's slow he doesn't show any signs of infection and he does seem to be making some improvement. I do believe that he may need a switch up and dressings to try to help this to heal more appropriately and quickly. 10/19/18 on evaluation today patient actually appears to be doing better in regard to his left lateral lower extremity ulcer. This is shown signs of having much less Slough buildup at this point due to the fact he has been  using the [employer]. Obviously this is very good news. The overall size of the wound is not dramatically smaller but again the appearance is. 11/02/18 on evaluation today patient actually appears to be doing quite well in regard to his lower [place] ulcer. A lot of the skin around the ulcer is actually somewhat irritating at this point this seems to be more due to the dressing causing irritation from the adhesive that anything else. Fortunately there is no signs of active infection at this time. [name], [name] ([id number]) Patient History Information obtained from Patient. Family History Diabetes - Mother,Maternal Grandparents, Heart Disease - Mother,Maternal Grandparents, Hereditary Spherocytosis - Siblings, Hypertension - Maternal Grandparents, Stroke - Siblings, No family history of Cancer, Kidney Disease, Lung Disease, Seizures, Thyroid Problems, Tuberculosis. Social History Current some day smoker, Marital Status - Married, Alcohol Use - Rarely, Drug Use - No History, Caffeine Use - Moderate. Medical History Eyes Denies history of Cataracts, Glaucoma, Optic Neuritis Ear/Nose/Mouth/Throat Denies history of Chronic sinus problems/congestion, Middle ear problems Hematologic/Lymphatic Denies history of Anemia, Hemophilia, Human Immunodeficiency Virus, Lymphedema, Sickle Cell Disease Respiratory Patient has history of Sleep Apnea - cpap Denies history of Aspiration, Asthma, Chronic Obstructive Pulmonary Disease (COPD), Pneumothorax Cardiovascular Patient has history of Hypertension Denies history of Angina, Arrhythmia, Congestive Heart Failure, Coronary Artery Disease, Deep Vein Thrombosis, Hypotension, Myocardial Infarction, Peripheral Arterial Disease, Peripheral Venous Disease, Phlebitis, Vasculitis Gastrointestinal Patient has history of Colitis Endocrine Denies history of Type I Diabetes, Type II Diabetes Genitourinary Denies history of End Stage Renal Disease Immunological Denies  history of Lupus Erythematosus, Raynaud s, Scleroderma Integumentary (Skin) Denies history of History of Burn, History of pressure wounds Musculoskeletal Denies history of Gout, Rheumatoid Arthritis, Osteoarthritis, Osteomyelitis Neurologic Denies history of Dementia, Neuropathy, Quadriplegia, Paraplegia, Seizure Disorder Oncologic Denies history of Received Chemotherapy, Received Radiation Psychiatric Denies history of Anorexia/bulimia, Confinement Anxiety Review of Systems (ROS) Constitutional Symptoms (General Health) Denies complaints or symptoms of Fatigue, Fever, Chills, Marked Weight Change. Respiratory Denies complaints or symptoms of Chronic or frequent coughs, Shortness of Breath. Cardiovascular Denies complaints or symptoms of Chest pain, LE edema. Psychiatric Denies complaints or symptoms of Anxiety, Claustrophobia. [name], [name] ([id number]) Objective Constitutional Well-nourished and well-hydrated in no acute distress. Vitals Time Taken: 8:28 AM, Height: 71 in, Weight: 338 lbs,  BMI: 47.1, Temperature: 98.6 F, Pulse: 66 bpm, Respiratory Rate: 16 breaths/min, Blood Pressure: 136/76 mmHg. Respiratory normal breathing without difficulty. clear to auscultation bilaterally. Cardiovascular regular rate and rhythm with normal S1, S2. Psychiatric this patient is able to make decisions and demonstrates good insight into disease process. Alert and Oriented x 3. pleasant and cooperative. General Notes: Patient's wound bed again showed signs of Slough in the surface of the wound here just Santyl that does seem to be helping to soften this up to some degree. Subsequently I feel like he is making good progress it's just very slow. I do believe that the irritation around the world is not due to infection although it is read and erythematous point of hearing I think this is what their attention from the dressing itself. Integumentary (Hair, Skin) Wound #1 status is Open. Original  cause of wound was Gradually Appeared. The wound is located on the Left,Lateral Lower Leg. The wound measures 2cm length x 1.8cm width x 0.2cm depth; 2.827cm^2 area and 0.565cm^3 volume. There is Fat Layer (Subcutaneous Tissue) Exposed exposed. There is no tunneling or undermining noted. There is a large amount of serous drainage noted. The wound margin is distinct with the outline attached to the wound base. There is no granulation within the wound bed. There is a large (67-100%) amount of necrotic tissue within the wound bed including Adherent Slough. 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 There was a Chemical/Enzymatic/Mechanical debridement performed by STONE III, Zaedyn Covin E., PA-C. after achieving pain control using Lidocaine. Agent used was [employer]. A time out was conducted at 09:30, prior to the start of the procedure. There was no Ahmad, Avyn ([id number]) bleeding. The procedure was tolerated well with a pain level of 0 throughout and a pain level of 0 following the procedure. Post Debridement Measurements: 2cm length x 1.8cm width x 0.2cm depth; 0.565cm^3 volume. Character of Wound/Ulcer Post Debridement is improved. Post procedure Diagnosis Wound #1: Same as Pre-Procedure Plan Wound Cleansing: Wound #1 Left,Lateral Lower Leg: Clean wound with Normal Saline. Anesthetic (add to Medication List): Wound #1 Left,Lateral Lower Leg: Topical Lidocaine 4% cream applied to wound bed prior to debridement (In Clinic Only). Primary Wound Dressing: Wound #1 Left,Lateral Lower Leg: Santyl Ointment - dry gauze to help absorb Secondary Dressing: Wound #1 Left,Lateral Lower Leg: ABD pad - Tubi Grip to hold Conform/Kerlix Dressing Change Frequency: Wound #1  Left,Lateral Lower Leg: Change dressing every day. Follow-up Appointments: Wound #1 Left,Lateral Lower Leg: Return Appointment in 2 weeks. Edema Control: Wound #1 Left,Lateral Lower Leg: Elevate legs to the level of the heart and pump ankles as often as possible Medications-please add to medication list.: Wound #1 Left,Lateral Lower Leg: Santyl Enzymatic Ointment My suggestion at this time is gonna be that we try something different in regard to the way we go about securing this wound dressing. I'm hopeful that we can prevent any additional irritation and inflammation. Please see above. We will subsequently see were things that at follow-up. Please see above for specific wound care orders. We will see patient for re-evaluation in 2 week(s) here in the clinic. If anything worsens or changes patient will contact our office for additional recommendations. Electronic Signature(s) Signed: 11/04/2018 6:29:07 AM By: Worthy Keeler PA-C Entered By: Worthy Keeler on 11/02/2018 23:31:45 Klonowski, [name] ([id number]) --------------------------------------------------------------------------------  ROS/PFSH Details Patient Name: [name], [name] Date of Service: 11/02/2018 8:30 AM Medical Record Number: [id number] Patient Account Number: [id number] Date of Birth/Sex: [birth year] (37 y.o. M) Treating RN: Harold Barban Primary Care Provider: [name] Other Clinician: Referring Provider: [name] Treating Provider/Extender: Melburn Hake, Blu Mcglaun Weeks in Treatment: 76 Information Obtained From Patient Constitutional Symptoms (General Health) Complaints and Symptoms: Negative for: Fatigue; Fever; Chills; Marked Weight Change Respiratory Complaints and Symptoms: Negative for: Chronic or frequent coughs; Shortness of Breath Medical History: Positive for: Sleep Apnea - cpap Negative for: Aspiration; Asthma; Chronic Obstructive Pulmonary Disease (COPD); Pneumothorax Cardiovascular Complaints  and Symptoms: Negative for: Chest pain; LE edema Medical History: Positive for: Hypertension Negative for: Angina; Arrhythmia; Congestive Heart Failure; Coronary Artery Disease; Deep Vein Thrombosis; Hypotension; Myocardial Infarction; Peripheral Arterial Disease; Peripheral Venous Disease; Phlebitis; Vasculitis Psychiatric Complaints and Symptoms: Negative for: Anxiety; Claustrophobia Medical History: Negative for: Anorexia/bulimia; Confinement Anxiety Eyes Medical History: Negative for: Cataracts; Glaucoma; Optic Neuritis Ear/Nose/Mouth/Throat Medical History: Negative for: Chronic sinus problems/congestion; Middle ear problems Hematologic/Lymphatic Medical History: Negative for: Anemia; Hemophilia; Human Immunodeficiency Virus; Lymphedema; Sickle Cell Disease Hardiman, Brogan ([id number]) Gastrointestinal Medical History: Positive for: Colitis Endocrine Medical History: Negative for: Type I Diabetes; Type II Diabetes Genitourinary Medical History: Negative for: End Stage Renal Disease Immunological Medical History: Negative for: Lupus Erythematosus; Raynaudos; Scleroderma Integumentary (Skin) Medical History: Negative for: History of Burn; History of pressure wounds Musculoskeletal Medical History: Negative for: Gout; Rheumatoid Arthritis; Osteoarthritis; Osteomyelitis Neurologic Medical History: Negative for: Dementia; Neuropathy; Quadriplegia; Paraplegia; Seizure Disorder Oncologic Medical History: Negative for: Received Chemotherapy; Received Radiation Immunizations Pneumococcal Vaccine: Received Pneumococcal Vaccination: No Implantable Devices No devices added Family and Social History Cancer: No; Diabetes: Yes - Mother,Maternal Grandparents; Heart Disease: Yes - Mother,Maternal Grandparents; Hereditary Spherocytosis: Yes - Siblings; Hypertension: Yes - Maternal Grandparents; Kidney Disease: No; Lung Disease: No; Seizures: No; Stroke: Yes - Siblings; Thyroid  Problems: No; Tuberculosis: No; Current some day smoker; Marital Status - Married; Alcohol Use: Rarely; Drug Use: No History; Caffeine Use: Moderate; Financial Concerns: No; Food, Clothing or Shelter Needs: No; Support System Lacking: No; Transportation Concerns: No Physician Affirmation I have reviewed and agree with the above information. Electronic Signature(s) Signed: 11/04/2018 6:29:07 AM By: Hedy Camara, [place] ([id number]) Signed: 11/04/2018 4:39:36 PM By: Harold Barban Entered By: Worthy Keeler on 11/02/2018 23:31:11 LOREN, SAWAYA ([id number]) -------------------------------------------------------------------------------- SuperBill Details Patient Name: [name] Date of Service: 11/02/2018 Medical Record Number: [id number] Patient Account Number: [id number] Date of Birth/Sex: 04-30-1981 (37 y.o. 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.2 Venous insufficiency (chronic) (peripheral) Facility Procedures CPT4 Code: 34287681 Description: 15726 - WOUND CARE VISIT-LEV 2 EST PT Modifier: Quantity: 1 CPT4 Code: [id number] Description: 16384 - DEBRIDE W/O ANES NON SELECT Modifier: Quantity: 1 Physician Procedures CPT4 Code: 5364680 Description: 32122 - WC PHYS LEVEL 4 - EST PT ICD-10 Diagnosis Description L97.222 Non-pressure chronic ulcer of left calf with fat layer expo L88 Pyoderma gangrenosum I87.2 Venous insufficiency (chronic) (peripheral) Modifier: sed Quantity: 1 Electronic Signature(s) Signed: 11/04/2018 6:29:07 AM By: Worthy Keeler PA-C Entered By: Worthy Keeler on 11/02/2018 23:32:00

## 2018-11-08 DIAGNOSIS — G4733 Obstructive sleep apnea (adult) (pediatric): Secondary | ICD-10-CM

## 2018-11-12 ENCOUNTER — Telehealth: Payer: Self-pay | Admitting: Internal Medicine

## 2018-11-12 ENCOUNTER — Ambulatory Visit (INDEPENDENT_AMBULATORY_CARE_PROVIDER_SITE_OTHER): Payer: 59 | Admitting: Internal Medicine

## 2018-11-12 DIAGNOSIS — G4719 Other hypersomnia: Secondary | ICD-10-CM | POA: Diagnosis not present

## 2018-11-12 DIAGNOSIS — G4733 Obstructive sleep apnea (adult) (pediatric): Secondary | ICD-10-CM

## 2018-11-12 NOTE — Telephone Encounter (Addendum)
ATC pt to obtain preferred DME. No answer with no option to leave voicemail . Line rang >16mn.

## 2018-11-12 NOTE — Patient Instructions (Signed)
We will start a new CPAP, auto CPAP with pressure range 5-20. We will order a new CPAP to see if this is covered, if not then we may need to order a new sleep study in order to requalify you for a new CPAP.

## 2018-11-12 NOTE — Progress Notes (Addendum)
[facility]    Virtual Visit via Video Note I connected with patient on 11/12/18 at 10:30 AM EDT by video and verified that I am speaking with the correct person using two identifiers.   I discussed the limitations, risks of performing an evaluation and management service by video and the availability of in person appointments. I also discussed with the patient that there may be a patient responsible charge related to this service.  In light of current covid-19 pandemic, patient also understands that we are trying to protect them by minimizing in office contact if at all possible.  The patient expressed understanding and agreed to proceed. Please see note below for further detail.    The patient was advised to call back or seek an in-person evaluation if the symptoms worsen or if the condition fails to improve as anticipated.   Shane CrutchPradeep Markala Sitts, MD   Assessment and Plan:  Obstructive sleep apnea with excessive daytime sleepiness. - Patient has known obstructive sleep apnea, recently his CPAP is only blowing properly, and does not turn on properly. - We will therefore try to order him a new auto CPAP with pressure range 5-20.  If this is not covered by insurance we may need to order a new sleep study in order to requalify him for CPAP.  Addendum 12/04/2018: Excessive daytime sleepiness, symptoms and signs of OSA, recommend HST.    Return in about 3 months (around 02/12/2019).   Date: 11/12/2018  MRN# [id number] Laray AngerRobert Redmon 06/30/1980  Referring Physician: Allayne GitelmanK Clark, NP.   Laray AngerRobert Iwan is a 38 y.o. old male seen in consultation for chief complaint of: Daytime sleepiness.     HPI:  Laray AngerRobert Aries is a 38 y.o. old male  He has been diagnosed with OSA, about 7 years ago. At that time he was having a lot of snoring, he was having trouble staying awake. He had a sleep study with an AHI of 48. Since starting on CPAP he has been doing well, he stopped snoring.   He goes to bed at 11 pm, wakes at 630 am. He does not nap during the day.  Currently he feels that his cpap is getting old, it is not working well, and does not always blowing pressure properly and not turning on properly.      PMHX:   Past Medical History:  Diagnosis Date  . Blood in stool   . Depression   . Elevated blood pressure   . Hyperlipidemia   . OSA (obstructive sleep apnea) 2014  . Seasonal allergies   . Ulcerative colitis (HCC) 03/05/2018   Surgical Hx:  Past Surgical History:  Procedure Laterality Date  . SEPTOPLASTY  2007  . WRIST SURGERY     Family Hx:  Family History  Problem Relation Age of Onset  . Alcohol abuse Paternal Aunt   . Alcohol abuse Paternal Uncle   . Stroke Paternal Uncle   . Hyperlipidemia Maternal Grandfather   . Hypertension Maternal Grandfather   . Diabetes Maternal Grandfather   . Alcohol abuse Maternal Grandfather   . Multiple sclerosis Mother   . Dementia Mother    Social Hx:   Social History   Tobacco Use  . Smoking status: Former Games developermoker  . Smokeless tobacco: Former NeurosurgeonUser    Quit date: 03/17/2015  . Tobacco [place] for 9 years  Substance Use Topics  . Alcohol use: Yes    Alcohol/week: 0.0 standard drinks    Comment:  soical  . Drug use: Not on file   Medication:    Current Outpatient Medications:  .  acetaminophen (TYLENOL) 500 MG tablet, Take 1,000 mg by mouth every 6 (six) hours as needed for headache (pain)., Disp: , Rfl:  .  cetirizine (ZYRTEC) 10 MG tablet, Take 1 tablet (10 mg total) by mouth daily., Disp: 90 tablet, Rfl: 1 .  clobetasol ointment (TEMOVATE) 2.02 %, Apply 1 application topically 2 (two) times daily. , Disp: , Rfl:  .  doxycycline (VIBRA-TABS) 100 MG tablet, Take 100 mg by mouth 2 (two) times daily. , Disp: , Rfl:  .  fluticasone (FLONASE) 50 MCG/ACT nasal spray, Place 1 spray into both nostrils 2 (two) times daily., Disp: 16 g, Rfl: 6 .  hydrocortisone 2.5 % cream,  Apply 1 application topically 2 (two) times daily., Disp: , Rfl:  .  ibuprofen (ADVIL,MOTRIN) 200 MG tablet, Take 400-600 mg by mouth every 6 (six) hours as needed for headache (pain)., Disp: , Rfl:  .  metFORMIN (GLUCOPHAGE) 1000 MG tablet, Take 1 tablet (1,000 mg total) by mouth 2 (two) times daily with a meal., Disp: 180 tablet, Rfl: 3 .  mupirocin ointment (BACTROBAN) 2 %, Apply topically two (2) 60g times a day. Mix with clobetasol and apply twice daily, Disp: , Rfl:  .  varenicline (CHANTIX STARTING MONTH PAK) 0.5 MG X 11 & 1 MG X 42 tablet, Take 0.5 mg by mouth once daily for 3 days, then increase to 0.5 mg twice daily for 4 days, then increase to 1 mg tablet twice daily., Disp: 53 tablet, Rfl: 0 .  venlafaxine XR (EFFEXOR-XR) 150 MG 24 hr capsule, TAKE 1 CAPSULE BY MOUTH  DAILY WITH BREAKFAST, Disp: 90 capsule, Rfl: 0   Allergies:  Biaxin [clarithromycin] and Milk-related compounds  Review of Systems: Gen:  Denies  fever, sweats, chills HEENT: Denies blurred vision, double vision. bleeds, sore throat Cvc:  No dizziness, chest pain. Resp:   Denies cough or sputum production, shortness of breath Gi: Denies swallowing difficulty, stomach pain. Gu:  Denies bladder incontinence, burning urine Ext:   No Joint pain, stiffness. Skin: No skin rash,  hives  Endoc:  No polyuria, polydipsia. Psych: No depression, insomnia. Other:  All other systems were reviewed with the patient and were negative other that what is mentioned in the HPI.   Physical Examination:    LABORATORY PANEL:   CBC No results for input(s): WBC, HGB, HCT, PLT in the last 168 hours. ------------------------------------------------------------------------------------------------------------------  Chemistries  No results for input(s): NA, K, CL, CO2, GLUCOSE, BUN, CREATININE, CALCIUM, MG, AST, ALT, ALKPHOS, BILITOT in the last 168 hours.  Invalid input(s): GFRCGP  ------------------------------------------------------------------------------------------------------------------  Cardiac Enzymes No results for input(s): TROPONINI in the last 168 hours. ------------------------------------------------------------  RADIOLOGY:  No results found.     Thank  you for the consultation and for allowing [name] to assist in the care of your patient. Our recommendations are noted above.  Please contact us if we can be of further service.   Marda Stalker, M.D., F.C.C.P.  Board Certified in Internal Medicine, Pulmonary Medicine, [place], and Sleep Medicine.  [facility] Office Number: [phone]   11/12/2018

## 2018-11-13 NOTE — Telephone Encounter (Signed)
ATC x2- no answer with no option to leave voicemail.

## 2018-11-14 ENCOUNTER — Ambulatory Visit: Admit: 2018-11-14 | Discharge: 2018-11-15 | Payer: PRIVATE HEALTH INSURANCE

## 2018-11-14 DIAGNOSIS — Z1159 Encounter for screening for other viral diseases: Principal | ICD-10-CM

## 2018-11-14 NOTE — Unmapped (Signed)
COVID Pre-Procedure Intake Form     Assessment     Andrew Cameron is a 38 y.o. male presenting to [facility] for COVID testing prior to procedure .     Plan     COVID swab obtained.  Reviewed with patient importance of staying home until procedure and limiting exposure.      Subjective     Andrew Cameron is a 38 y.o. male who [facility] with complaints of the following:       Are you scheduled to have surgery in the next 3 days?  No   Are you scheduled to have a procedure like a colonoscopy or endoscopy in the next 3 days?  Yes       Have you ever been tested for COVID-19 with a swab of your nose?    No           In the last 14 days?     Have you traveled outside of [place]? No   Have you been in close contact with someone confirmed by a test to have COVID? (Close contact is within 6 feet for at least 10 minutes) No         Worked in a health care facility?   No           Symptoms (as reported by patient):  Do you currently have any of the following:  Subjective fever (felt feverish) No   Chills (especially repeated shaking chills) No   Severe fatigue (felt very tired)  No   Muscle aches No   Runny nose No   Sore Throat No   Loss of taste or smell No   Cough (new onset or worsening of chronic cough) No   Shortness of breath No   Nausea or vomiting No   Headache No   Abdominal pain No   Diarrhea (3 or more loose stools in last 24 hours) No     Objective     Testing Performed:  Test Specimen Type Sent to   COVID-19  NP Swab  Lab       Scribe's Attestation: Norvel Richards, MD obtained and performed the history, physical exam and medical decision making elements that were entered into the chart.  Signed by Mal Amabile, LCSW serving as Scribe, on 11/14/2018 10:48 AM

## 2018-11-16 NOTE — Telephone Encounter (Signed)
Spoke to pt, who verified that he will need new DME locally.  Order for cpap has been placed. Nothing further is needed.

## 2018-11-17 ENCOUNTER — Encounter
Admit: 2018-11-17 | Discharge: 2018-11-17 | Payer: PRIVATE HEALTH INSURANCE | Attending: Certified Registered" | Primary: Certified Registered"

## 2018-11-17 ENCOUNTER — Ambulatory Visit: Admit: 2018-11-17 | Discharge: 2018-11-17 | Payer: PRIVATE HEALTH INSURANCE

## 2018-11-17 DIAGNOSIS — K5190 Ulcerative colitis, unspecified, without complications: Principal | ICD-10-CM

## 2018-11-19 ENCOUNTER — Ambulatory Visit: Payer: 59 | Admitting: Physician Assistant

## 2018-11-24 ENCOUNTER — Encounter: Payer: 59 | Attending: Physician Assistant | Admitting: Physician Assistant

## 2018-11-24 ENCOUNTER — Other Ambulatory Visit
Admission: RE | Admit: 2018-11-24 | Discharge: 2018-11-24 | Disposition: A | Discharge status: Home / Self Care | Payer: 59 | Source: Ambulatory Visit | Attending: Physician Assistant | Admitting: Physician Assistant

## 2018-11-24 ENCOUNTER — Other Ambulatory Visit: Payer: Self-pay

## 2018-11-24 DIAGNOSIS — L88 Pyoderma gangrenosum: Secondary | ICD-10-CM | POA: Insufficient documentation

## 2018-11-24 DIAGNOSIS — B999 Unspecified infectious disease: Secondary | ICD-10-CM | POA: Insufficient documentation

## 2018-11-24 DIAGNOSIS — L97222 Non-pressure chronic ulcer of left calf with fat layer exposed: Secondary | ICD-10-CM | POA: Diagnosis present

## 2018-11-24 DIAGNOSIS — I872 Venous insufficiency (chronic) (peripheral): Secondary | ICD-10-CM | POA: Insufficient documentation

## 2018-11-25 NOTE — Progress Notes (Signed)
FAIZ, WEBER ([id number]) Visit Report for 11/24/2018 Chief Complaint Document Details Patient Name: [name], [name] Date of Service: 11/24/2018 8:00 AM Medical Record Number: [id number] Patient Account Number: [id number] Date of Birth/Sex: 12/19/1980 (38 y.o. M) Treating RN: Montey Hora Primary Care Provider: [name] Other Clinician: Referring Provider: [name] Treating Provider/Extender: Melburn Hake, Lanisha Stepanian Weeks in Treatment: 102 Information Obtained from: Patient Chief Complaint He is here in follow up evaluation for LLE pyoderma ulcer Electronic Signature(s) Signed: 11/25/2018 1:30:00 AM By: Worthy Keeler PA-C Entered By: Worthy Keeler on 11/24/2018 08:17:41 ANTAWN, SISON ([id number]) -------------------------------------------------------------------------------- HPI Details Patient Name: [name] Date of Service: 11/24/2018 8:00 AM Medical Record Number: [id number] Patient Account Number: [id number] Date of Birth/Sex: Mar 27, 1981 (38 y.o. M) Treating RN: Montey Hora Primary Care Provider: [name] Other Clinician: Referring Provider: [name] Treating Provider/Extender: Melburn Hake, Nochum Fenter Weeks in Treatment: 102 History of Present Illness HPI Description: 12/04/16; 38 year old man who comes into the clinic today for review of a wound on the posterior left calf. He tells me that is been there for about a year. He is not a diabetic he does smoke half a pack per day. He was seen in the ER on 11/20/16 felt to have cellulitis around the wound and was given clindamycin. An x-ray did not show osteomyelitis. The patient initially tells me that he has a milk allergy that sets off a pruritic itching rash on his lower legs which she scratches incessantly and he thinks that's what may have set up the wound. He has been using various topical antibiotics and ointments without any effect. He works in a [employer] and is on his feet all day. He does not have a prior  history of wounds however he does have the rash on both lower legs the right arm and the ventral aspect of his left arm. These are excoriations and clearly have had scratching however there are of macular looking areas on both legs including a substantial larger area on the right leg. This does not have an underlying open area. There is no blistering. The patient tells me that 2 years ago in [place] in response to the rash on his legs he saw a dermatologist who told him he had a condition which may be pyoderma gangrenosum although I may be putting words into his mouth. He seemed to recognize this. 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 He is not in any treatment for this. He's had no recent travel 12/11/16; the patient arrives today with his wound and roughly the same condition we've been using silver alginate this is a deep punched out wound with some surrounding erythema but no tenderness. Biopsy I did did not show confirmed pyoderma gangrenosum suggested nonspecific inflammation and vasculitis but does not provide an actual description of what was seen by the pathologist. I'm really not able to understand this We have also received information from the patient's dermatologist in [place] notes from April 2016. This was a doctor Agarwal- antal. 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 He was prescribed topical steroid high potency under occlusion which helped but at this point the patient did not have a deep punched out wound. 12/18/16; the patient's wound is larger in terms of surface area however this surface looks better and there is less depth. The surrounding erythema also is better. The patient states that the wrap we put on came off 2 days ago when he has been using  his compression stockings. He we are in the process of getting a dermatology consult. 12/26/16 on evaluation today patient's left lower extremity wound shows evidence of  infection with surrounding erythema noted. He has been tolerating the dressing changes but states that he has noted more discomfort. There is a larger area of erythema surrounding the wound. No fevers, chills, nausea, or vomiting noted at this time. With that being said the wound still does have slough covering the surface. He is not allergic to any medication that he is aware of at this point. In regard to his right lower extremity he had several regions that are erythematous and pruritic he wonders if there's anything we can do to help that. 01/02/17 I reviewed patient's wound culture which was obtained his visit last week. He was placed on doxycycline at that point. Unfortunately that does not appear to be an antibiotic that would likely help with the situation however the pseudomonas noted on culture is sensitive to Cipro. Also unfortunately patient's wound seems to have a large compared to last week's evaluation. Not severely so but there are definitely increased measurements in general. He is continuing to have discomfort as well he writes this to be a seven out of 10. In fact he would prefer me not to perform any debridement today due to the fact that he is having discomfort and considering he has an active infection on the little reluctant to do so anyway. No fevers, chills, nausea, or vomiting noted at this time. 01/08/17; patient seems dermatology on September 5. I suspect dermatology will want the slides from the biopsy I did sent to their pathologist. I'm not sure if there is a way we can expedite that. In any case the culture I did before I left on vacation 3 weeks ago showed Pseudomonas he was given 10 days of Cipro and per her description of her intake nurses is actually somewhat better this week although the wound is quite a bit bigger than I remember the last time I saw this. He still has 3 more days of Cipro MARQUEE, FUCHS ([id number]) 01/21/17; dermatology appointment tomorrow. He has  completed the ciprofloxacin for Pseudomonas. Surface of the wound looks better however he is had some deterioration in the lesions on his right leg. Meantime the left lateral leg wound we will continue with sample 01/29/17; patient had his dermatology appointment but I can't yet see that note. He is completed his antibiotics. The wound is more superficial but considerably larger in circumferential area than when he came in. This is in his left lateral calf. He also has swollen erythematous areas with superficial wounds on the right leg and small papular areas on both arms. There apparently areas in her his upper thighs and buttocks I did not look at those. Dermatology biopsied the right leg. Hopefully will have their input next week. 02/05/17; patient went back to see his dermatologist who told him that he had a "scratching problem" as well as staph. He is now on a 30 day course of doxycycline and I believe she gave him triamcinolone cream to the right leg areas to help with the itching [not exactly sure but probably triamcinolone]. She apparently looked at the left lateral leg wound although this was not rebiopsied and I think felt to be ultimately part of the same pathogenesis. He is using sample border foam and changing nevus himself. He now has a new open area on the right posterior leg which was his biopsy site I don't  have any of the dermatology notes 02/12/17; we put the patient in compression last week with SANTYL to the wound on the left leg and the biopsy. Edema is much better and the depth of the wound is now at level of skin. Area is still the same oBiopsy site on the right lateral leg we've also been using santyl with a border foam dressing and he is changing this himself. 02/19/17; Using silver alginate started last week to both the substantial left leg wound and the biopsy site on the right wound. He is tolerating compression well. Has a an appointment with his primary M.D. tomorrow  wondering about diuretics although I'm wondering if the edema problem is actually lymphedema 02/26/17; the patient has been to see his primary doctor Dr. Jerrel Ivory at [facility] our primary care. She started him on Lasix 20 mg and this seems to have helped with the edema. However we are not making substantial change with the left lateral calf wound and inflammation. The biopsy site on the right leg also looks stable but not really all that different. 03/12/17; the patient has been to see vein and vascular Dr. Lucky Cowboy. He has had venous reflux studies I have not reviewed these. I did get a call from his dermatology office. 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 They therefore recommended clobetasol and tetracycline i.e. 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 03/26/17; apparently the patient just had reflux studies not an appointment with Dr. dew. She arrives in clinic today having applied clobetasol for 2-3 weeks. He notes over the last 2-3 days excessive drainage having to change the dressing 3-4 times a day and also expanding erythema. 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 Lillia Carmel at [facility] dermatology locally who kindly saw him at my request. 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 My concern was the degree of erythema medially extending from his left leg wound which was either inflammation from  pyoderma or cellulitis. I put him on Augmentin however culture of the wound showed Pseudomonas which is quinolone sensitive. I really don't believe he has cellulitis however in view of everything I will continue and give him a course of Cipro. He is also on doxycycline as an immune modulator for the pyoderma. In addition to his original wound on the left lateral leg with surrounding erythema he has a wound on the right posterior calf which was an original biopsy site done by dermatology. 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 Saw Dr. Lillia Carmel yesterday. He has been using topical antibiotics to both wound areas his original wound on the left and the biopsies/pathergy area on the right. There is definitely some improvement in the inflammation around the wound on the right although the patient states he has increasing sensitivity of the wounds. He is on prednisone 60 and doxycycline 1 as prescribed by Dr. Lillia Carmel. He is covering the topical  antibiotic with gauze and putting this in his own compression stocks and changing this daily. He states that Dr. Lottie Rater did a culture of the left leg wound yesterday 05/07/17; pyoderma gangrenosum. The patient saw Dr. Lillia Carmel yesterday and has a follow-up with her in one month. He is still using topical antibiotics to both wounds although he can't recall exactly what type. He is still on prednisone 60 mg. Dr. Lillia Carmel stated that the doxycycline could stop if we were in agreement. 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 The right medial leg was induced by biopsy/pathergy. The area on the right is essentially healed. 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 The right medial leg has closed and  remains closed. He is still on prednisone 60. oHe tells me he missed his last dermatology appointment with Dr. Lillia Carmel but will make another appointment. He reports that her blood sugar at a recent screen in [place] was high 200's. He was 180 today. He is more cushingoid blood pressure is Ceja, Copeland ([id number]) up a bit. I think he is going to require still much longer prednisone perhaps another 3 months before attempting to taper. In the meantime his wound is a lot better. Smaller. He is cleaning this off daily and applying topical antibiotics. When he was last in the clinic I thought about changing to [facility] and actually put in a couple of calls to dermatology although probably not during their business hours. 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 He continues on oral prednisone. He has been using triple antibiotic ointment. There is surface debris and we will transition to [facility] and have him return in 2 weeks. He has lost 30 pounds since his last appointment with lifestyle modification. He may benefit from topical steroid cream for treatment this can be considered at a later date. 08/22/17 on evaluation today patient appears to actually be doing rather well in regard to his left lateral lower extremity ulcer. He has actually been managed by Dr. Dellia Nims most recently. Patient is currently on oral steroids at this time. This seems to have been of benefit for him. Nonetheless his last visit was actually with Leah on 08/06/17. Currently he is not utilizing any topical steroid creams although this could be of benefit as well. No fevers, chills, nausea, or vomiting noted at this time. 09/05/17 on evaluation today patient appears to be doing better in regard to his left lateral lower extremity ulcer. He has been tolerating the dressing changes without complication.  He is using Santyl with good effect. Overall I'm very pleased with how things are standing at this point. Patient likewise is happy that this is doing better. 09/19/17 on evaluation today patient actually appears to be doing rather well in regard to his left lateral lower extremity ulcer. 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 He's not having any significant pain. 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 He has been tolerating the Santyl without complication and in general I feel like he's making good  progress. 10/17/17 on evaluation today patient appears to be doing very well in regard to his left lateral lower surety ulcer. He has been tolerating the dressing changes without complication. There does not appear to be any evidence of infection he's alternating the Santyl and the triple antibiotic ointment every other day this seems to be doing well for him. 11/03/17 on evaluation today patient appears to be doing very well in regard to his left lateral lower extremity ulcer. He is been tolerating the dressing changes without complication which is good news. Fortunately there does not appear to be any evidence of infection which is also great news. Overall is doing excellent they are starting to taper down on the prednisone is down to 40 mg at this point it also started topical clobetasol for him. 11/17/17 on evaluation today patient appears to be doing well in regard to his left lateral lower surety ulcer. He's been tolerating the dressing changes without complication. He does note that he is having no pain, no excessive drainage or discharge, and overall he feels like things are going about how he would expect and hope they would. Overall he seems to have no evidence of infection at this time in my opinion which is  good news. 12/04/17-He is seen in follow-up evaluation for right lateral lower extremity ulcer. He has been applying topical steroid cream. Today's measurement show slight increase in size. Over the next 2 weeks we will transition to every other day Santyl and steroid cream. He has been encouraged to monitor for changes and notify clinic with any concerns 12/15/17 on evaluation today patient's left lateral motion the ulcer and fortunately is doing worse again at this point. This just since last week to this week has close to doubled in size according to the patient. I did not seeing last week's I do not have a visual to compare this to in our system was also down so we do not have all the charts and at this point. Nonetheless it does have me somewhat concerned in regard to the fact that again he was worried enough about it he has contact the dermatology that placed them back on the full strength, 50 mg a day of the prednisone that he was taken previous. He continues to alternate using clobetasol along with Santyl at this point. He is obviously somewhat frustrated. 12/22/17 on evaluation today patient appears to be doing a little worse compared to last evaluation. Unfortunately the wound is a little deeper and slightly larger than the last week's evaluation. With that being said he has made some progress in regard to the irritation surrounding at this time unfortunately despite that progress that's been made he still has a significant issue going on here. 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 For that reason I discussed with him today the possibility of perform a wound culture to ensure there's nothing overtly infected. 01/06/18 on evaluation today patient's wound is larger and deeper than previously evaluated. With that being said it did appear that  his wound was infected after my last evaluation with him. Subsequently I did end up prescribing a prescription for Bactrim DS which she has been taking and having no complication with. Fortunately there does not appear to be any evidence of [place], Gabryel ([id number]) infection at this point in time as far as anything spreading, no want to  touch, and overall I feel like things are showing signs of improvement. 01/13/18 on evaluation today patient appears to be even a little larger and deeper than last time. There still muscle exposed in the base of the wound. Nonetheless he does appear to be less erythematous I do believe inflammation is calming down also believe the infection looks like it's probably resolved at this time based on what I'm seeing. No fevers, chills, nausea, or vomiting noted at this time. 01/30/18 on evaluation today patient actually appears to visually look better for the most part. Unfortunately those visually this looks better he does seem to potentially have what may be an abscess in the muscle that has been noted in the central portion of the wound. This is the first time that I have noted what appears to be fluctuance in the central portion of the muscle. With that being said I'm somewhat more concerned about the fact that this might indicate an abscess formation at this location. I do believe that an ultrasound would be appropriate. This is likely something we need to try to do as soon as possible. He has been switch to mupirocin ointment and he is no longer using the steroid ointment as prescribed by dermatology he sees them again next week he's been decreased from 60 to 40 mg of prednisone. 03/09/18 on evaluation today patient actually appears to be doing a little better compared to last time I saw him. There's not as much erythema surrounding the wound itself. He I did review his most recent infectious disease note which was dated 02/24/18. He saw Dr. Michel Bickers in  [place]. 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 Patient is now off of antibiotics and they are continually observing this. There seems to be no change in the past two weeks in my pinion based on what the patient says and what I see today compared to what Dr. Megan Salon likely saw two weeks ago. No fevers, chills, nausea, or vomiting noted at this time. 03/23/18 on evaluation today patient's wound actually appears to be showing signs of improvement which is good news. He is currently still on the Dapsone. He is also working on tapering the prednisone to get off of this and Dr. Lottie Rater is working with him in this regard. Nonetheless overall I feel like the wound is doing well it does appear based on the infectious disease note that I reviewed from Dr. 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 I did also review the results of his ultrasound of left lower extremity which revealed there was a dentist tissue in the base of the wound without an abscess noted. 04/06/18 on evaluation today the patient's left lateral lower extremity ulcer actually appears to be doing fairly well which is excellent news. There does not appear to be any evidence of infection at this time which is also great news. Overall he still does have a significantly large ulceration although little by little he seems to be making progress. He is down to 10 mg a day of the prednisone. 04/20/18 on evaluation today patient actually appears to be doing excellent at this time in regard to his left lower extremity ulcer. He's making signs of good progress unfortunately this is taking much longer than we would really like  to see but nonetheless he is making progress. Fortunately there does not appear to be any evidence of infection at this  time. No fevers, chills, nausea, or vomiting noted at this time. The patient has not been using the Santyl due to the cost he hadn't got in this field yet. He's mainly been using the antibiotic ointment topically. Subsequently he also tells me that he really has not been scrubbing in the shower I think this would be helpful again as I told him it doesn't have to be anything too aggressive to even make it believe just enough to keep it free of some of the loose slough and biofilm on the wound surface. 05/11/18 on evaluation today patient's wound appears to be making slow but sure progress in regard to the left lateral lower extremity ulcer. He is been tolerating the dressing changes without complication. Fortunately there does not appear to be any evidence of infection at this time. He is still just using triple antibiotic ointment along with clobetasol occasionally over the area. He never got the Santyl and really does not seem to intend to in my pinion. 06/01/18 on evaluation today patient appears to be doing a little better in regard to his left lateral lower extremity ulcer. He states that overall he does not feel like he is doing as well with the Dapsone as he did with the prednisone. Nonetheless he sees his dermatologist later today and is gonna talk to them about the possibility of going back on the prednisone. Overall again I believe that the wound would be better if you would utilize Santyl but he really does not seem to be interested in going back to the [place] at this point. He has been using triple antibiotic ointment. 06/15/18 on evaluation today patient's wound actually appears to be doing about the same at this point. Fortunately there is no signs of infection at this time. He has made slight improvements although he continues to not really want to clean the wound bed at this point. He states that he just doesn't mess with it he doesn't want to cause any problems with everything else  he has going on. He has been on medication, antibiotics as prescribed by his dermatologist, for a staff infection of his lower extremities which is really drying out now and looking much better he tells me. Fortunately there is no sign of overall infection. JANN, RA ([id number]) 06/29/18 on evaluation today patient appears to be doing well in regard to his left lateral lower surety ulcer all things considering. Fortunately his staff infection seems to be greatly improved compared to previous. He has no signs of infection and this is drying up quite nicely. He is still the doxycycline for this is no longer on cental, Dapsone, or any of the other medications. His dermatologist has recommended possibility of an infusion but right now he does not want to proceed with that. 07/13/18 on evaluation today patient appears to be doing about the same in regard to his left lateral lower surety ulcer. Fortunately there's no signs of infection at this time which is great news. Unfortunately he still builds up a significant amount of Slough/biofilm of the surface of the wound he still is not really cleaning this as he should be appropriately. Again I'm able to easily with saline and gauze remove the majority of this on the surface which if you would do this at home would likely be a dramatic improvement for him as far as getting the area  to improve. Nonetheless overall I still feel like he is making progress is just very slow. I think Santyl will be of benefit for him as well. Still he has not gotten this as of this point. 07/27/18 on evaluation today patient actually appears to be doing little worse in regards of the erythema around the periwound region of the wound he also tells me that he's been having more drainage currently compared to what he was experiencing last time I saw him. He states not quite as bad as what he had because this was infected previously but nonetheless is still appears to be doing  poorly. Fortunately there is no evidence of systemic infection at this point. The patient tells me that he is not going to be able to afford the Santyl. He is still waiting to hear about the infusion therapy with his dermatologist. Apparently she wants an updated colonoscopy first. 08/10/18 on evaluation today patient appears to be doing better in regard to his left lateral lower extremity ulcer. Fortunately he is showing signs of improvement in this regard he's actually been approved for Remicade infusion's as well although this has not been scheduled as of yet. Fortunately there's no signs of active infection at this time in regard to the wound although he is having some issues with infection of the right lower extremity is been seen as dermatologist for this. Fortunately they are definitely still working with him trying to keep things under control. 09/07/18 on evaluation today patient is actually doing rather well in regard to his left lateral lower extremity ulcer. He notes these actually having some hair grow back on his extremity which is something he has not seen in years. He also tells me that the pain is really not giving them any trouble at this time which is also good news overall she is very pleased with the progress he's using a combination of the mupirocin along with the probate is all mixed. 09/21/18 on evaluation today patient actually appears to be doing fairly well all things considered in regard to his looks from the ulcer. He's been tolerating the dressing changes without complication. Fortunately there's no signs of active infection at this time which is good news he is still on all antibiotics or prevention of the staff infection. He has been on prednisone for time although he states it is gonna contact his dermatologist and see if she put them on a short course due to some irritation that he has going on currently. Fortunately there's no evidence of any overall worsening this is  going very slow I think cental would be something that would be helpful for him although he states that $50 for tube is quite expensive. He therefore is not willing to get that at this point. 10/06/18 on evaluation today patient actually appears to be doing decently well in regard to his left lateral leg ulcer. He's been tolerating the dressing changes without complication. Fortunately there's no signs of active infection at this time. Overall I'm actually rather pleased with the progress he's making although it's slow he doesn't show any signs of infection and he does seem to be making some improvement. I do believe that he may need a switch up and dressings to try to help this to heal more appropriately and quickly. 10/19/18 on evaluation today patient actually appears to be doing better in regard to his left lateral lower extremity ulcer. This is shown signs of having much less Slough buildup at this point due to  the fact he has been using the Santyl. Obviously this is very good news. The overall size of the wound is not dramatically smaller but again the appearance is. 11/02/18 on evaluation today patient actually appears to be doing quite well in regard to his lower [place] ulcer. A lot of the skin around the ulcer is actually somewhat irritating at this point this seems to be more due to the dressing causing irritation from the adhesive that anything else. Fortunately there is no signs of active infection at this time. 11/24/18 on evaluation today patient appears to be doing a little worse in regard to his overall appearance of his lower extremity ulcer. There's more erythema and warmth around the wound unfortunately. He is currently on doxycycline which he has been on for some time. With that being said I'm not sure that seems to be helping with what appears to possibly be an acute cellulitis with regard to his left lower extremity ulcer. No fevers, chills, nausea, or vomiting noted at this  time. Electronic Signature(s) JERMIE, HIPPE ([id number]) Signed: 11/25/2018 1:30:00 AM By: Worthy Keeler PA-C Entered By: Worthy Keeler on 11/24/2018 08:58:40 KAISEI, GILBO ([id number]) -------------------------------------------------------------------------------- Physical Exam Details Patient Name: [name] Date of Service: 11/24/2018 8:00 AM Medical Record Number: [id number] Patient Account Number: [id number] Date of Birth/Sex: [birth year] (38 y.o. M) Treating RN: Montey Hora Primary Care Provider: [name] Other Clinician: Referring Provider: [name] Treating Provider/Extender: Melburn Hake, Johnnie Goynes Weeks in Treatment: 73 Constitutional Well-nourished and well-hydrated in no acute distress. Respiratory normal breathing without difficulty. clear to auscultation bilaterally. Cardiovascular regular rate and rhythm with normal S1, S2. Psychiatric this patient is able to make decisions and demonstrates good insight into disease process. Alert and Oriented x 3. pleasant and cooperative. Notes Upon inspection patient's wound bed actually showed signs of being about the same there still [facility] covering unfortunately we really cannot debride this as he does not allow for debridement. 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 Nonetheless I do think that there's some evidence he may have an infection unfortunately I cannot get a great culture but I did do a swap today in order to evaluate for the possibility of infection and see what may be present possibly that would be resistant to doxycycline that might be more sensitive but responded to another antibiotic. Electronic Signature(s) Signed: 11/25/2018 1:30:00 AM By: Worthy Keeler PA-C Entered By: Worthy Keeler on 11/24/2018 08:59:27 NISAIAH, BECHTOL ([id number]) -------------------------------------------------------------------------------- Physician  Orders Details Patient Name: [name] Date of Service: 11/24/2018 8:00 AM Medical Record Number: [id number] Patient Account Number: [id number] Date of Birth/Sex: 1980-07-22 (38 y.o. M) Treating RN: Montey Hora Primary Care Provider: [name] Other Clinician: Referring Provider: [name] Treating Provider/Extender: Melburn Hake, Elynore Dolinski Weeks in Treatment: 70 Verbal / Phone Orders: No Diagnosis Coding ICD-10 Coding Code Description [id number] Non-pressure chronic ulcer of left calf with fat layer exposed L88 Pyoderma gangrenosum I87.2 Venous insufficiency (chronic) (peripheral) Wound Cleansing Wound #1 Left,Lateral Lower Leg o Clean wound with Normal Saline. Anesthetic (add to Medication List) Wound #1 Left,Lateral Lower Leg o Topical Lidocaine 4% cream applied to wound bed prior to debridement (In Clinic Only). Primary Wound Dressing Wound #1 Left,Lateral Lower Leg o Other: - triamcinalone ointment Secondary Dressing Wound #1 Left,Lateral Lower Leg o Boardered Foam Dressing Dressing Change Frequency Wound #1 Left,Lateral Lower Leg o Change dressing every day. Follow-up Appointments Wound #1 Left,Lateral Lower Leg   o Return Appointment in 2 weeks. Edema Control Wound #1 Left,Lateral Lower Leg o Elevate legs to the level of the heart and pump ankles as often as possible Medications-please add to medication list. Wound #1 Left,Lateral Lower Leg o Other: - triamcinalone Laboratory GLENDEL, JAGGERS ([id number]) o Bacteria identified in Wound by Culture (MICRO) oooo LOINC Code: 2423-5 oooo Convenience Name: Wound culture routine Electronic Signature(s) Signed: 11/24/2018 5:36:25 PM By: Montey Hora Signed: 11/25/2018 1:30:00 AM By: Worthy Keeler PA-C Entered By: Montey Hora on 11/24/2018 08:31:39 [name] ([id number]) -------------------------------------------------------------------------------- Problem List Details Patient Name: [name] Date of Service: 11/24/2018 8:00 AM Medical Record Number: [id number] Patient Account Number: [id number] Date of Birth/Sex: 03-Feb-1981 (38 y.o. M) Treating RN: Montey Hora Primary Care Provider: [name] Other Clinician: Referring Provider: [name] Treating Provider/Extender: Melburn Hake, Klarissa Mcilvain Weeks in Treatment: 102 Active Problems ICD-10 Evaluated Encounter Code Description Active Date Today Diagnosis L97.222 Non-pressure chronic ulcer of left calf with fat layer exposed 12/04/2016 No Yes L88 Pyoderma gangrenosum 03/26/2017 No Yes I87.2 Venous insufficiency (chronic) (peripheral) 12/04/2016 No Yes Inactive Problems ICD-10 Code Description Active Date Inactive Date L97.213 Non-pressure chronic ulcer of right calf with necrosis of muscle 04/02/2017 04/02/2017 Resolved Problems Electronic Signature(s) Signed: 11/25/2018 1:30:00 AM By: Worthy Keeler PA-C Entered By: Worthy Keeler on 11/24/2018 08:17:33 Ozier, Ander ([id number]) -------------------------------------------------------------------------------- Progress Note Details Patient Name: [name] Date of Service: 11/24/2018 8:00 AM Medical Record Number: [id number] Patient Account Number: [id number] Date of Birth/Sex: [birth year] (38 y.o. M) Treating RN: Montey Hora Primary Care Provider: [name] Other Clinician: Referring Provider: [name] Treating Provider/Extender: Melburn Hake, Adina Puzzo Weeks in Treatment: 102 Subjective Chief Complaint Information obtained from Patient He is here in follow up evaluation for LLE pyoderma ulcer History of Present Illness (HPI) 12/04/16; 38 year old man who comes into the clinic today for review of a wound on the posterior left calf. He tells me that is been there for about a year. He is not a diabetic he does smoke half a pack per day. He was seen in the ER on 11/20/16 felt to have cellulitis around the wound and was given clindamycin. An x-ray did not  show osteomyelitis. The patient initially tells me that he has a milk allergy that sets off a pruritic itching rash on his lower legs which she scratches incessantly and he thinks that's what may have set up the wound. He has been using various topical antibiotics and ointments without any effect. He works in a [employer] and is on his feet all day. He does not have a prior history of wounds however he does have the rash on both lower legs the right arm and the ventral aspect of his left arm. These are excoriations and clearly have had scratching however there are of macular looking areas on both legs including a substantial larger area on the right leg. This does not have an underlying open area. There is no blistering. The patient tells me that 2 years ago in [place] in response to the rash on his legs he saw a dermatologist who told him he had a condition which may be pyoderma gangrenosum although I may be putting words into his mouth. He seemed to recognize this. 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 He is not in any treatment for this. He's had no recent travel 12/11/16; the patient arrives today with his wound and roughly the same condition we've been using silver  alginate this is a deep punched out wound with some surrounding erythema but no tenderness. Biopsy I did did not show confirmed pyoderma gangrenosum suggested nonspecific inflammation and vasculitis but does not provide an actual description of what was seen by the pathologist. I'm really not able to understand this We have also received information from the patient's dermatologist in [place] notes from April 2016. This was a doctor Agarwal- antal. 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 He was prescribed topical steroid high potency under occlusion which helped but at this point the patient did not have a deep punched out wound. 12/18/16; the patient's wound is larger in  terms of surface area however this surface looks better and there is less depth. The surrounding erythema also is better. The patient states that the wrap we put on came off 2 days ago when he has been using his compression stockings. He we are in the process of getting a dermatology consult. 12/26/16 on evaluation today patient's left lower extremity wound shows evidence of infection with surrounding erythema noted. He has been tolerating the dressing changes but states that he has noted more discomfort. There is a larger area of erythema surrounding the wound. No fevers, chills, nausea, or vomiting noted at this time. With that being said the wound still does have slough covering the surface. He is not allergic to any medication that he is aware of at this point. In regard to his right lower extremity he had several regions that are erythematous and pruritic he wonders if there's anything we can do to help that. 01/02/17 I reviewed patient's wound culture which was obtained his visit last week. He was placed on doxycycline at that point. Unfortunately that does not appear to be an antibiotic that would likely help with the situation however the pseudomonas noted on culture is sensitive to Cipro. Also unfortunately patient's wound seems to have a large compared to last week's evaluation. Not severely so but there are definitely increased measurements in general. He is continuing to have discomfort as well he writes this to be a seven out of 10. In fact he would prefer me not to perform any debridement today due to the fact that he is having discomfort and considering he has an active infection on the little reluctant to do so anyway. No fevers, Hintze, Rubin ([id number]) chills, nausea, or vomiting noted at this time. 01/08/17; patient seems dermatology on September 5. I suspect dermatology will want the slides from the biopsy I did sent to their pathologist. I'm not sure if there is a way we can  expedite that. In any case the culture I did before I left on vacation 3 weeks ago showed Pseudomonas he was given 10 days of Cipro and per her description of her intake nurses is actually somewhat better this week although the wound is quite a bit bigger than I remember the last time I saw this. He still has 3 more days of Cipro 01/21/17; dermatology appointment tomorrow. He has completed the ciprofloxacin for Pseudomonas. Surface of the wound looks better however he is had some deterioration in the lesions on his right leg. Meantime the left lateral leg wound we will continue with sample 01/29/17; patient had his dermatology appointment but I can't yet see that note. He is completed his antibiotics. The wound is more superficial but considerably larger in circumferential area than when he came in. This is in his left lateral calf. He also has swollen erythematous areas with  superficial wounds on the right leg and small papular areas on both arms. There apparently areas in her his upper thighs and buttocks I did not look at those. Dermatology biopsied the right leg. Hopefully will have their input next week. 02/05/17; patient went back to see his dermatologist who told him that he had a "scratching problem" as well as staph. He is now on a 30 day course of doxycycline and I believe she gave him triamcinolone cream to the right leg areas to help with the itching [not exactly sure but probably triamcinolone]. She apparently looked at the left lateral leg wound although this was not rebiopsied and I think felt to be ultimately part of the same pathogenesis. He is using sample border foam and changing nevus himself. He now has a new open area on the right posterior leg which was his biopsy site I don't have any of the dermatology notes 02/12/17; we put the patient in compression last week with SANTYL to the wound on the left leg and the biopsy. Edema is much better and the depth of the wound is now at  level of skin. Area is still the same Biopsy site on the right lateral leg we've also been using santyl with a border foam dressing and he is changing this himself. 02/19/17; Using silver alginate started last week to both the substantial left leg wound and the biopsy site on the right wound. He is tolerating compression well. Has a an appointment with his primary M.D. tomorrow wondering about diuretics although I'm wondering if the edema problem is actually lymphedema 02/26/17; the patient has been to see his primary doctor Dr. Jerrel Ivory at [facility] our primary care. She started him on Lasix 20 mg and this seems to have helped with the edema. However we are not making substantial change with the left lateral calf wound and inflammation. The biopsy site on the right leg also looks stable but not really all that different. 03/12/17; the patient has been to see vein and vascular Dr. Lucky Cowboy. He has had venous reflux studies I have not reviewed these. I did get a call from his dermatology office. 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 They therefore recommended clobetasol and tetracycline i.e. 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 03/26/17; apparently the patient just had reflux studies not an appointment with Dr. dew. She arrives in clinic today having applied clobetasol for 2-3 weeks. He notes over the last 2-3 days excessive drainage having to change the dressing 3-4 times a day and also expanding erythema. He states the expanding erythema seems to come and go and was last this red was earlier in the month.he is on doxycycline 150 mg twice a day as an anti-inflammatory systemic therapy for possible  pyoderma gangrenosum along with the topical clobetasol 04/02/17; the patient was seen last week by Dr. Lillia Carmel at [facility] dermatology locally who kindly saw him at my request. 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 My concern was the degree of erythema medially extending from his left leg wound which was either inflammation from pyoderma or cellulitis. I put him on Augmentin however culture of the wound showed Pseudomonas which  is quinolone sensitive. I really don't believe he has cellulitis however in view of everything I will continue and give him a course of Cipro. He is also on doxycycline as an immune modulator for the pyoderma. In addition to his original wound on the left lateral leg with surrounding erythema he has a wound on the right posterior calf which was an original biopsy site done by dermatology. 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 Saw Dr. Lillia Carmel yesterday. He has been using topical antibiotics to both wound areas his original wound on the left and the biopsies/pathergy area on the right. There is definitely some improvement in the inflammation around the wound on the right although the patient states he has increasing sensitivity of the wounds. He is on prednisone 60 and doxycycline 1 as prescribed by Dr. Lillia Carmel. He is covering the topical antibiotic with gauze and putting this in his own compression stocks and changing this daily. He states that Dr. Lottie Rater did a culture of the left leg wound yesterday 05/07/17; pyoderma gangrenosum. The patient saw Dr. Lillia Carmel yesterday and has a follow-up with her in one month. He is still using topical antibiotics to both wounds although he can't recall exactly what type. He is still on prednisone 60 mg. Dr. Lillia Carmel stated that the doxycycline could stop if we were in agreement. 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 The right medial leg was induced by [name] ([id number]) biopsy/pathergy. The area on the right is essentially healed. 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 The right medial leg has closed and remains closed. He is still on prednisone 60. He tells me he missed his last dermatology appointment with Dr. Lillia Carmel but will make another appointment. He reports that her blood sugar at a recent screen in [place] was high 200's. He was 180 today. 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 I think he is going to require still much longer prednisone perhaps another 3 months before attempting to taper. In the meantime his wound is a lot better. Smaller. He is cleaning this off daily and applying topical antibiotics. When he was last in the clinic I thought about changing to [facility] and actually put in a couple of calls to dermatology although probably not during their business hours. 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 He continues on oral prednisone. He has been using triple antibiotic ointment. There is surface debris and we will transition to [facility] and have him return in 2 weeks. He has lost 30 pounds since his last appointment with lifestyle modification. He may benefit from topical steroid cream for treatment this can be considered at a later date. 08/22/17 on evaluation today patient appears to actually be doing rather well in regard to his left lateral lower extremity ulcer. He has actually been managed by Dr. Dellia Nims most recently. Patient is currently on oral steroids at this time. This seems to have been of benefit for him. Nonetheless  his last visit was actually with Leah on 08/06/17. Currently he is not utilizing any topical steroid creams although this could be of benefit as well. No fevers, chills, nausea,  or vomiting noted at this time. 09/05/17 on evaluation today patient appears to be doing better in regard to his left lateral lower extremity ulcer. He has been tolerating the dressing changes without complication. He is using Santyl with good effect. Overall I'm very pleased with how things are standing at this point. Patient likewise is happy that this is doing better. 09/19/17 on evaluation today patient actually appears to be doing rather well in regard to his left lateral lower extremity ulcer. 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 He's not having any significant pain. 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 He has been tolerating the Santyl without complication and in general I feel like he's making good progress. 10/17/17 on evaluation today patient appears to be doing very well in regard to his left lateral lower surety ulcer. He has been tolerating the dressing changes without complication. There does not appear to be any evidence of infection he's alternating the Santyl and the triple antibiotic ointment every other day this seems to be doing well for him. 11/03/17 on evaluation today patient appears to be doing very well in regard to his left lateral lower extremity ulcer. He is been tolerating the dressing changes without complication which is good news. Fortunately there does not appear to be any evidence of infection which is also great news. Overall is doing excellent they are starting to taper down on the prednisone is down to 40 mg at this point it also started topical clobetasol for him. 11/17/17 on evaluation today patient  appears to be doing well in regard to his left lateral lower surety ulcer. He's been tolerating the dressing changes without complication. He does note that he is having no pain, no excessive drainage or discharge, and overall he feels like things are going about how he would expect and hope they would. Overall he seems to have no evidence of infection at this time in my opinion which is good news. 12/04/17-He is seen in follow-up evaluation for right lateral lower extremity ulcer. He has been applying topical steroid cream. Today's measurement show slight increase in size. Over the next 2 weeks we will transition to every other day Santyl and steroid cream. He has been encouraged to monitor for changes and notify clinic with any concerns 12/15/17 on evaluation today patient's left lateral motion the ulcer and fortunately is doing worse again at this point. This just since last week to this week has close to doubled in size according to the patient. I did not seeing last week's I do not have a visual to compare this to in our system was also down so we do not have all the charts and at this point. Nonetheless it does have me somewhat concerned in regard to the fact that again he was worried enough about it he has contact the dermatology that placed them back on the full strength, 50 mg a day of the prednisone that he was taken previous. He continues to alternate using clobetasol along with Santyl at this point. He is obviously somewhat frustrated. 12/22/17 on evaluation today patient appears to be doing a little worse compared to last evaluation. Unfortunately the wound is a little deeper and slightly larger than the last week's evaluation. With that being said he has made some progress in regard to the irritation  surrounding at this time unfortunately despite that progress that's been made he still has a significant issue going on here. I'm not certain that he is having really any true infection at this  time although with the Pyoderma gangrenosum it can Cassaday, Everson ([id number]) sometimes be difficult to differentiate infection versus just inflammation. For that reason I discussed with him today the possibility of perform a wound culture to ensure there's nothing overtly infected. 01/06/18 on evaluation today patient's wound is larger and deeper than previously evaluated. With that being said it did appear that his wound was infected after my last evaluation with him. Subsequently I did end up prescribing a prescription for Bactrim DS which she has been taking and having no complication with. Fortunately there does not appear to be any evidence of infection at this point in time as far as anything spreading, no want to touch, and overall I feel like things are showing signs of improvement. 01/13/18 on evaluation today patient appears to be even a little larger and deeper than last time. There still muscle exposed in the base of the wound. Nonetheless he does appear to be less erythematous I do believe inflammation is calming down also believe the infection looks like it's probably resolved at this time based on what I'm seeing. No fevers, chills, nausea, or vomiting noted at this time. 01/30/18 on evaluation today patient actually appears to visually look better for the most part. Unfortunately those visually this looks better he does seem to potentially have what may be an abscess in the muscle that has been noted in the central portion of the wound. This is the first time that I have noted what appears to be fluctuance in the central portion of the muscle. With that being said I'm somewhat more concerned about the fact that this might indicate an abscess formation at this location. I do believe that an ultrasound would be appropriate. This is likely something we need to try to do as soon as possible. He has been switch to mupirocin ointment and he is no longer using the steroid ointment as  prescribed by dermatology he sees them again next week he's been decreased from 60 to 40 mg of prednisone. 03/09/18 on evaluation today patient actually appears to be doing a little better compared to last time I saw him. There's not as much erythema surrounding the wound itself. He I did review his most recent infectious disease note which was dated 02/24/18. He saw Dr. Michel Bickers in [place]. 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 Patient is now off of antibiotics and they are continually observing this. There seems to be no change in the past two weeks in my pinion based on what the patient says and what I see today compared to what Dr. Megan Salon likely saw two weeks ago. No fevers, chills, nausea, or vomiting noted at this time. 03/23/18 on evaluation today patient's wound actually appears to be showing signs of improvement which is good news. He is currently still on the Dapsone. He is also working on tapering the prednisone to get off of this and Dr. Lottie Rater is working with him in this regard. Nonetheless overall I feel like the wound is doing well it does appear based on the infectious disease note that I reviewed from Dr. 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 I did also review the results of his ultrasound of left lower extremity which revealed there was a dentist tissue in the base of the wound without an abscess noted. 04/06/18 on evaluation today the patient's left lateral lower extremity ulcer actually appears to be doing fairly well which is excellent news. There does not appear to be any evidence of infection at this time which is also great news. Overall he still does have a significantly large ulceration although little by little he seems to be making progress. He is down  to 10 mg a day of the prednisone. 04/20/18 on evaluation today patient actually appears to be doing excellent at this time in regard to his left lower extremity ulcer. He's making signs of good progress unfortunately this is taking much longer than we would really like to see but nonetheless he is making progress. Fortunately there does not appear to be any evidence of infection at this time. No fevers, chills, nausea, or vomiting noted at this time. The patient has not been using the Santyl due to the cost he hadn't got in this field yet. He's mainly been using the antibiotic ointment topically. Subsequently he also tells me that he really has not been scrubbing in the shower I think this would be helpful again as I told him it doesn't have to be anything too aggressive to even make it believe just enough to keep it free of some of the loose slough and biofilm on the wound surface. 05/11/18 on evaluation today patient's wound appears to be making slow but sure progress in regard to the left lateral lower extremity ulcer. He is been tolerating the dressing changes without complication. Fortunately there does not appear to be any evidence of infection at this time. He is still just using triple antibiotic ointment along with clobetasol occasionally over the area. He never got the Santyl and really does not seem to intend to in my pinion. 06/01/18 on evaluation today patient appears to be doing a little better in regard to his left lateral lower extremity ulcer. He states that overall he does not feel like he is doing as well with the Dapsone as he did with the prednisone. Nonetheless he sees his dermatologist later today and is gonna talk to them about the possibility of going back on the prednisone. Overall again I believe that the wound would be better if you would utilize Santyl but he really does not seem to be interested in going back to the [place] at this point. He has been using triple  antibiotic ointment. CARSTON, RIEDL ([id number]) 06/15/18 on evaluation today patient's wound actually appears to be doing about the same at this point. Fortunately there is no signs of infection at this time. He has made slight improvements although he continues to not really want to clean the wound bed at this point. He states that he just doesn't mess with it he doesn't want to cause any problems with everything else he has going on. He has been on medication, antibiotics as prescribed by his dermatologist, for a staff infection of his lower extremities which is really drying out now and looking much better he tells me. Fortunately there is no sign of overall infection. 06/29/18 on evaluation today patient appears to be doing well in regard to his left lateral lower surety ulcer all things considering. Fortunately his staff infection seems to be greatly improved compared to previous. He  has no signs of infection and this is drying up quite nicely. He is still the doxycycline for this is no longer on cental, Dapsone, or any of the other medications. His dermatologist has recommended possibility of an infusion but right now he does not want to proceed with that. 07/13/18 on evaluation today patient appears to be doing about the same in regard to his left lateral lower surety ulcer. Fortunately there's no signs of infection at this time which is great news. Unfortunately he still builds up a significant amount of Slough/biofilm of the surface of the wound he still is not really cleaning this as he should be appropriately. Again I'm able to easily with saline and gauze remove the majority of this on the surface which if you would do this at home would likely be a dramatic improvement for him as far as getting the area to improve. Nonetheless overall I still feel like he is making progress is just very slow. I think Santyl will be of benefit for him as well. Still he has not gotten this as of this  point. 07/27/18 on evaluation today patient actually appears to be doing little worse in regards of the erythema around the periwound region of the wound he also tells me that he's been having more drainage currently compared to what he was experiencing last time I saw him. He states not quite as bad as what he had because this was infected previously but nonetheless is still appears to be doing poorly. Fortunately there is no evidence of systemic infection at this point. The patient tells me that he is not going to be able to afford the Santyl. He is still waiting to hear about the infusion therapy with his dermatologist. Apparently she wants an updated colonoscopy first. 08/10/18 on evaluation today patient appears to be doing better in regard to his left lateral lower extremity ulcer. Fortunately he is showing signs of improvement in this regard he's actually been approved for Remicade infusion's as well although this has not been scheduled as of yet. Fortunately there's no signs of active infection at this time in regard to the wound although he is having some issues with infection of the right lower extremity is been seen as dermatologist for this. Fortunately they are definitely still working with him trying to keep things under control. 09/07/18 on evaluation today patient is actually doing rather well in regard to his left lateral lower extremity ulcer. He notes these actually having some hair grow back on his extremity which is something he has not seen in years. He also tells me that the pain is really not giving them any trouble at this time which is also good news overall she is very pleased with the progress he's using a combination of the mupirocin along with the probate is all mixed. 09/21/18 on evaluation today patient actually appears to be doing fairly well all things considered in regard to his looks from the ulcer. He's been tolerating the dressing changes without complication.  Fortunately there's no signs of active infection at this time which is good news he is still on all antibiotics or prevention of the staff infection. He has been on prednisone for time although he states it is gonna contact his dermatologist and see if she put them on a short course due to some irritation that he has going on currently. Fortunately there's no evidence of any overall worsening this is going very slow I think cental would be something that  would be helpful for him although he states that $50 for tube is quite expensive. He therefore is not willing to get that at this point. 10/06/18 on evaluation today patient actually appears to be doing decently well in regard to his left lateral leg ulcer. He's been tolerating the dressing changes without complication. Fortunately there's no signs of active infection at this time. Overall I'm actually rather pleased with the progress he's making although it's slow he doesn't show any signs of infection and he does seem to be making some improvement. I do believe that he may need a switch up and dressings to try to help this to heal more appropriately and quickly. 10/19/18 on evaluation today patient actually appears to be doing better in regard to his left lateral lower extremity ulcer. This is shown signs of having much less Slough buildup at this point due to the fact he has been using the [employer]. Obviously this is very good news. The overall size of the wound is not dramatically smaller but again the appearance is. 11/02/18 on evaluation today patient actually appears to be doing quite well in regard to his lower [place] ulcer. A lot of the skin around the ulcer is actually somewhat irritating at this point this seems to be more due to the dressing causing irritation from the adhesive that anything else. Fortunately there is no signs of active infection at this time. 11/24/18 on evaluation today patient appears to be doing a little worse in regard to  his overall appearance of his lower extremity Atwood, Daevion ([id number]) ulcer. There's more erythema and warmth around the wound unfortunately. He is currently on doxycycline which he has been on for some time. With that being said I'm not sure that seems to be helping with what appears to possibly be an acute cellulitis with regard to his left lower extremity ulcer. No fevers, chills, nausea, or vomiting noted at this time. Patient History Information obtained from Patient. Family History Diabetes - Mother,Maternal Grandparents, Heart Disease - Mother,Maternal Grandparents, Hereditary Spherocytosis - Siblings, Hypertension - Maternal Grandparents, Stroke - Siblings, No family history of Cancer, Kidney Disease, Lung Disease, Seizures, Thyroid Problems, Tuberculosis. Social History Current some day smoker, Marital Status - Married, Alcohol Use - Rarely, Drug Use - No History, Caffeine Use - Moderate. Medical History Eyes Denies history of Cataracts, Glaucoma, Optic Neuritis Ear/Nose/Mouth/Throat Denies history of Chronic sinus problems/congestion, Middle ear problems Hematologic/Lymphatic Denies history of Anemia, Hemophilia, Human Immunodeficiency Virus, Lymphedema, Sickle Cell Disease Respiratory Patient has history of Sleep Apnea - cpap Denies history of Aspiration, Asthma, Chronic Obstructive Pulmonary Disease (COPD), Pneumothorax Cardiovascular Patient has history of Hypertension Denies history of Angina, Arrhythmia, Congestive Heart Failure, Coronary Artery Disease, Deep Vein Thrombosis, Hypotension, Myocardial Infarction, Peripheral Arterial Disease, Peripheral Venous Disease, Phlebitis, Vasculitis Gastrointestinal Patient has history of Colitis Endocrine Denies history of Type I Diabetes, Type II Diabetes Genitourinary Denies history of End Stage Renal Disease Immunological Denies history of Lupus Erythematosus, Raynaud s, Scleroderma Integumentary (Skin) Denies history of  History of Burn, History of pressure wounds Musculoskeletal Denies history of Gout, Rheumatoid Arthritis, Osteoarthritis, Osteomyelitis Neurologic Denies history of Dementia, Neuropathy, Quadriplegia, Paraplegia, Seizure Disorder Oncologic Denies history of Received Chemotherapy, Received Radiation Psychiatric Denies history of Anorexia/bulimia, Confinement Anxiety Review of Systems (ROS) Constitutional Symptoms (General Health) Denies complaints or symptoms of Fatigue, Fever, Chills, Marked Weight Change. Respiratory Denies complaints or symptoms of Chronic or frequent coughs, Shortness of Breath. Cardiovascular Complains or has symptoms of LE edema. Denies complaints  or symptoms of Chest pain. Psychiatric Denies complaints or symptoms of Anxiety, Claustrophobia. TYQWAN, PINK ([id number]) Objective Constitutional Well-nourished and well-hydrated in no acute distress. Vitals Time Taken: 8:06 AM, Height: 71 in, Weight: 338 lbs, BMI: 47.1, Temperature: 98.5 F, Pulse: 77 bpm, Respiratory Rate: 16 breaths/min, Blood Pressure: 147/78 mmHg. Respiratory normal breathing without difficulty. clear to auscultation bilaterally. Cardiovascular regular rate and rhythm with normal S1, S2. Psychiatric this patient is able to make decisions and demonstrates good insight into disease process. Alert and Oriented x 3. pleasant and cooperative. General Notes: Upon inspection patient's wound bed actually showed signs of being about the same there still [facility] covering unfortunately we really cannot debride this as he does not allow for debridement. 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 Nonetheless I do think that there's some evidence he may have an infection unfortunately I cannot get a great culture but I did do a swap today in order to evaluate for the possibility of infection and see what may be present possibly that would be  resistant to doxycycline that might be more sensitive but responded to another antibiotic. Integumentary (Hair, Skin) Wound #1 status is Open. Original cause of wound was Gradually Appeared. The wound is located on the Left,Lateral Lower Leg. The wound measures 3.4cm length x 2cm width x 0.2cm depth; 5.341cm^2 area and 1.068cm^3 volume. There is Fat Layer (Subcutaneous Tissue) Exposed exposed. There is no tunneling or undermining noted. There is a large amount of serous drainage noted. The wound margin is distinct with the outline attached to the wound base. There is small (1-33%) red granulation within the wound bed. There is a large (67-100%) amount of necrotic tissue within the wound bed including Adherent Slough. 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 Anesthetic (add to Medication List): Wound #1 Left,Lateral Lower Leg: Topical Lidocaine 4% cream applied to wound bed prior to debridement (In Clinic Only). Primary Wound Dressing: Wound #1 Left,Lateral Lower Leg: Other: - triamcinalone ointment Secondary Dressing: Wound #1 Left,Lateral Lower Leg: Boardered Foam Dressing Dressing Change Frequency: Wound #1 Left,Lateral Lower Leg: Change dressing every day. Follow-up Appointments: Wound #1 Left,Lateral Lower Leg: Return Appointment in 2 weeks. Edema Control: Wound #1 Left,Lateral Lower Leg: Elevate legs to the level of the heart and pump ankles as often as possible Medications-please add to medication list.: Wound #1 Left,Lateral Lower Leg: Other: - triamcinalone Laboratory ordered were: Wound culture routine My suggestion at this time is gonna be that we continue with the above wound to measures for the next week and the patient is in  agreement with plan. We will subsequently see were things that at follow-up. If anything changes or worsens in the meantime patient will contact the office and let me know. Please see above for specific wound care orders. We will see patient for re-evaluation in 1 week(s) here in the clinic. If anything worsens or changes patient will contact our office for additional recommendations. Electronic Signature(s) Signed: 11/25/2018 1:30:00 AM By: Worthy Keeler PA-C Entered By: Worthy Keeler on 11/24/2018 08:59:45 COY, ROCHFORD ([id number]) -------------------------------------------------------------------------------- ROS/PFSH Details Patient Name: [name] Date of Service: 11/24/2018 8:00 AM Medical Record Number: [id number] Patient Account Number: [id number] Date of Birth/Sex: 07-Mar-1981 (38 y.o. M) Treating RN: Montey Hora Primary Care  Provider: [name] Other Clinician: Referring Provider: [name] Treating Provider/Extender: Melburn Hake, Sargon Scouten Weeks in Treatment: 102 Information Obtained From Patient Constitutional Symptoms (General Health) Complaints and Symptoms: Negative for: Fatigue; Fever; Chills; Marked Weight Change Respiratory Complaints and Symptoms: Negative for: Chronic or frequent coughs; Shortness of Breath Medical History: Positive for: Sleep Apnea - cpap Negative for: Aspiration; Asthma; Chronic Obstructive Pulmonary Disease (COPD); Pneumothorax Cardiovascular Complaints and Symptoms: Positive for: LE edema Negative for: Chest pain Medical History: Positive for: Hypertension Negative for: Angina; Arrhythmia; Congestive Heart Failure; Coronary Artery Disease; Deep Vein Thrombosis; Hypotension; Myocardial Infarction; Peripheral Arterial Disease; Peripheral Venous Disease; Phlebitis; Vasculitis Psychiatric Complaints and Symptoms: Negative for: Anxiety; Claustrophobia Medical History: Negative for: Anorexia/bulimia; Confinement  Anxiety Eyes Medical History: Negative for: Cataracts; Glaucoma; Optic Neuritis Ear/Nose/Mouth/Throat Medical History: Negative for: Chronic sinus problems/congestion; Middle ear problems Hematologic/Lymphatic Medical History: Negative for: Anemia; Hemophilia; Human Immunodeficiency Virus; Lymphedema; Sickle Cell Disease Yzaguirre, Wane ([id number]) Gastrointestinal Medical History: Positive for: Colitis Endocrine Medical History: Negative for: Type I Diabetes; Type II Diabetes Genitourinary Medical History: Negative for: End Stage Renal Disease Immunological Medical History: Negative for: Lupus Erythematosus; Raynaudos; Scleroderma Integumentary (Skin) Medical History: Negative for: History of Burn; History of pressure wounds Musculoskeletal Medical History: Negative for: Gout; Rheumatoid Arthritis; Osteoarthritis; Osteomyelitis Neurologic Medical History: Negative for: Dementia; Neuropathy; Quadriplegia; Paraplegia; Seizure Disorder Oncologic Medical History: Negative for: Received Chemotherapy; Received Radiation Immunizations Pneumococcal Vaccine: Received Pneumococcal Vaccination: No Implantable Devices No devices added Family and Social History Cancer: No; Diabetes: Yes - Mother,Maternal Grandparents; Heart Disease: Yes - Mother,Maternal Grandparents; Hereditary Spherocytosis: Yes - Siblings; Hypertension: Yes - Maternal Grandparents; Kidney Disease: No; Lung Disease: No; Seizures: No; Stroke: Yes - Siblings; Thyroid Problems: No; Tuberculosis: No; Current some day smoker; Marital Status - Married; Alcohol Use: Rarely; Drug Use: No History; Caffeine Use: Moderate; Financial Concerns: No; Food, Clothing or Shelter Needs: No; Support System Lacking: No; Transportation Concerns: No Physician Affirmation I have reviewed and agree with the above information. Electronic Signature(s) JUQUAN, REZNICK ([id number]) Signed: 11/24/2018 5:36:25 PM By: Montey Hora Signed:  11/25/2018 1:30:00 AM By: Worthy Keeler PA-C Entered By: Worthy Keeler on 11/24/2018 08:58:57 JAIRUS, TONNE ([id number]) -------------------------------------------------------------------------------- SuperBill Details Patient Name: [name] Date of Service: 11/24/2018 Medical Record Number: [id number] Patient Account Number: [id number] Date of Birth/Sex: 02/19/1981 (38 y.o. 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

## 2018-11-25 NOTE — Progress Notes (Signed)
SABASTION, HRDLICKA ([id number]) Visit Report for 11/24/2018 Arrival Information Details Patient Name: [name], [name] Date of Service: 11/24/2018 8:00 AM Medical Record Number: [id number] Patient Account Number: [id number] Date of Birth/Sex: [birth year] (37 y.o. M) Treating RN: Army Melia Primary Care Devian Bartolomei: [name] Other Clinician: Referring Nawaf Strange: [name] Treating Carrigan Delafuente/Extender: Melburn Hake, [name]: 59 Visit Information History Since Last Visit Added or deleted any medications: No Patient Arrived: Ambulatory Any new allergies or adverse reactions: No Arrival Time: 08:05 Had a fall or experienced change in No Accompanied By: self activities of daily living that [name] affect Transfer Assistance: None risk of falls: Patient Requires Transmission-Based No Signs or symptoms of abuse/neglect since last visito No Precautions: Hospitalized since last visit: No Patient Has Alerts: No Has Dressing in Place as Prescribed: Yes Pain Present Now: Yes Electronic Signature(s) Signed: 11/24/2018 10:47:17 AM By: Army Melia Entered By: Army Melia on 11/24/2018 08:06:07 [name] ([id number]) -------------------------------------------------------------------------------- Clinic Level of Care Assessment Details Patient Name: [name] Date of Service: 11/24/2018 8:00 AM Medical Record Number: [id number] Patient Account Number: [id number] Date of Birth/Sex: [name] (37 y.o. M) Treating RN: Montey Hora Primary Care Richie Vadala: [name] Other Clinician: Referring Cletus Paris: [name] Treating Jozlyn Schatz/Extender: Melburn Hake, [name]: 102 Clinic Level of Care Assessment Items TOOL 4 Quantity Score []  - Use when only an EandM is performed on FOLLOW-UP visit 0 ASSESSMENTS - Nursing Assessment / Reassessment X - Reassessment of Co-morbidities (includes updates in patient status) 1 10 X- 1 5 Reassessment of Adherence to Treatment  Plan ASSESSMENTS - Wound and Skin Assessment / Reassessment X - Simple Wound Assessment / Reassessment - one wound 1 5 []  - 0 Complex Wound Assessment / Reassessment - multiple wounds []  - 0 Dermatologic / Skin Assessment (not related to wound area) ASSESSMENTS - Focused Assessment []  - Circumferential Edema Measurements - multi extremities 0 []  - 0 Nutritional Assessment / Counseling / Intervention X- 1 5 Lower Extremity Assessment (monofilament, tuning fork, pulses) []  - 0 Peripheral Arterial Disease Assessment (using hand held doppler) ASSESSMENTS - Ostomy and/or Continence Assessment and Care []  - Incontinence Assessment and Management 0 []  - 0 Ostomy Care Assessment and Management (repouching, etc.) PROCESS - Coordination of Care X - Simple Patient / Family Education for ongoing care 1 15 []  - 0 Complex (extensive) Patient / Family Education for ongoing care X- 1 10 Staff obtains Programmer, systems, Records, Test Results / Process Orders []  - 0 Staff telephones HHA, Nursing Homes / Clarify orders / etc []  - 0 Routine Transfer to another Facility (non-emergent condition) []  - 0 Routine Hospital Admission (non-emergent condition) []  - 0 New Admissions / Biomedical engineer / Ordering NPWT, Apligraf, etc. []  - 0 Emergency Hospital Admission (emergent condition) X- 1 10 Simple Discharge Coordination [name], [name] ([id number]) []  - 0 Complex (extensive) Discharge Coordination PROCESS - Special Needs []  - Pediatric / Minor Patient Management 0 []  - 0 Isolation Patient Management []  - 0 Hearing / Language / Visual special needs []  - 0 Assessment of Community assistance (transportation, D/C planning, etc.) []  - 0 Additional assistance / Altered mentation []  - 0 Support Surface(s) Assessment (bed, cushion, seat, etc.) INTERVENTIONS - Wound Cleansing / Measurement X - Simple Wound Cleansing - one wound 1 5 []  - 0 Complex Wound Cleansing - multiple wounds X- 1 5 Wound  Imaging (photographs - any number of wounds) []  - 0 Wound Tracing (instead of photographs) X- 1 5 Simple Wound Measurement - one wound []  -  0 Complex Wound Measurement - multiple wounds INTERVENTIONS - Wound Dressings X - Small Wound Dressing one or multiple wounds 1 10 []  - 0 Medium Wound Dressing one or multiple wounds []  - 0 Large Wound Dressing one or multiple wounds X- 1 5 Application of Medications - topical []  - 0 Application of Medications - injection INTERVENTIONS - Miscellaneous []  - External ear exam 0 X- 1 5 Specimen Collection (cultures, biopsies, blood, body fluids, etc.) X- 1 5 Specimen(s) / Culture(s) sent or taken to Lab for analysis []  - 0 Patient Transfer (multiple staff / Civil Service fast streamer / Similar devices) []  - 0 Simple Staple / Suture removal (25 or less) []  - 0 Complex Staple / Suture removal (26 or more) []  - 0 Hypo / Hyperglycemic Management (close monitor of Blood Glucose) []  - 0 Ankle / Brachial Index (ABI) - do not check if billed separately X- 1 5 Vital Signs [name], [name] ([id number]) Has the patient been seen at the hospital within the last three years: Yes Total Score: 105 Level Of Care: New/Established - Level 3 Electronic Signature(s) Signed: 11/24/2018 5:36:25 PM By: Montey Hora Entered By: Montey Hora on 11/24/2018 08:32:40 [name] ([id number]) -------------------------------------------------------------------------------- Encounter Discharge Information Details Patient Name: [name] Date of Service: 11/24/2018 8:00 AM Medical Record Number: [id number] Patient Account Number: [id number] Date of Birth/Sex: [name] (37 y.o. M) Treating RN: Montey Hora Primary Care Tava Peery: [name] Other Clinician: Referring Damaris Geers: [name] Treating Rossie Scarfone/Extender: Melburn Hake, [name]: 102 Encounter Discharge Information Items Discharge Condition: Stable Ambulatory Status: Ambulatory Discharge  Destination: Home Transportation: Private Auto Accompanied By: self Schedule Follow-up Appointment: Yes Clinical Summary of Care: Electronic Signature(s) Signed: 11/24/2018 5:36:25 PM By: Montey Hora Entered By: Montey Hora on 11/24/2018 08:33:20 [name] ([id number]) -------------------------------------------------------------------------------- Lower Extremity Assessment Details Patient Name: [name] Date of Service: 11/24/2018 8:00 AM Medical Record Number: [id number] Patient Account Number: [id number] Date of Birth/Sex: [name] (37 y.o. M) Treating RN: Army Melia Primary Care Donyale Falcon: [name] Other Clinician: Referring Zahlia Deshazer: [name] Treating Rayder Sullenger/Extender: STONE III, [name]: 102 Edema Assessment Assessed: [Left: No] [Right: No] Edema: [Left: N] [Right: o] Vascular Assessment Pulses: Dorsalis Pedis Palpable: [Left:Yes] Electronic Signature(s) Signed: 11/24/2018 10:47:17 AM By: Army Melia Entered By: Army Melia on 11/24/2018 08:[name]:47 [name], [name] ([id number]) -------------------------------------------------------------------------------- Multi Wound Chart Details Patient Name: [name] Date of Service: 11/24/2018 8:00 AM Medical Record Number: [id number] Patient Account Number: [id number] Date of Birth/Sex: [name] (37 y.o. M) Treating RN: Montey Hora Primary Care Edlyn Rosenburg: [name] Other Clinician: Referring Kiandra Sanguinetti: [name] Treating Milta Croson/Extender: Melburn Hake, [name]: 102 Vital Signs Height(in): 71 Pulse(bpm): 77 Weight(lbs): 338 Blood Pressure(mmHg): 147/78 Body Mass Index(BMI): 47 Temperature(F): 98.5 Respiratory Rate 16 (breaths/min): Photos: [N/A:N/A] Wound Location: Left Lower Leg - Lateral N/A N/A Wounding Event: Gradually Appeared N/A N/A Primary Etiology: Pyoderma N/A N/A Comorbid History: Sleep Apnea, Hypertension, N/A N/A Colitis Date  Acquired: 11/18/2015 N/A N/A Weeks of Treatment: 102 N/A N/A Wound Status: Open N/A N/A Measurements L x W x D 3.4x2x0.2 N/A N/A (cm) Area (cm) : 5.341 N/A N/A Volume (cm) : 1.068 N/A N/A % Reduction in Area: -8.80% N/A N/A % Reduction in Volume: 72.80% N/A N/A Classification: Full Thickness With Exposed N/A N/A Support Structures Exudate Amount: Large N/A N/A Exudate Type: Serous N/A N/A Exudate Color: amber N/A N/A Wound Margin: Distinct, outline attached N/A N/A Granulation Amount: Small (1-33%) N/A N/A Granulation Quality: Red N/A N/A Necrotic Amount: Large (  67-100%) N/A N/A Exposed Structures: Fat Layer (Subcutaneous N/A N/A Tissue) Exposed: Yes Fascia: No Tendon: No Muscle: No [name], [name] ([id number]) Joint: No Bone: No Epithelialization: None N/A N/A Treatment Notes Electronic Signature(s) Signed: 11/24/2018 5:36:25 PM By: Montey Hora Entered By: Montey Hora on 11/24/2018 08:21:38 [name] ([id number]) -------------------------------------------------------------------------------- Multi-Disciplinary Care Plan Details Patient Name: [name] Date of Service: 11/24/2018 8:00 AM Medical Record Number: [id number] Patient Account Number: [id number] Date of Birth/Sex: [birth year] (37 y.o. M) Treating RN: Montey Hora Primary Care Jaycen Vercher: [name] Other Clinician: Referring Merrilee Ancona: [name] Treating Elverta Dimiceli/Extender: Melburn Hake, [name]: 44 Active Inactive Orientation to the Wound Care Program Nursing Diagnoses: Knowledge deficit related to the wound healing center program Goals: Patient/caregiver will verbalize understanding of the [facility] Program Date Initiated: 12/11/2016 Target Resolution Date: 03/13/2017 Goal Status: Active Interventions: Provide education on orientation to the wound center Notes: Venous Leg Ulcer Nursing Diagnoses: Knowledge deficit related to disease process and  management Potential for venous Insuffiency (use before diagnosis confirmed) Goals: Non-invasive venous studies are completed as ordered Date Initiated: 12/11/2016 Target Resolution Date: 03/13/2017 Goal Status: Active Patient will maintain optimal edema control Date Initiated: 12/11/2016 Target Resolution Date: 03/13/2017 Goal Status: Active Patient/caregiver will verbalize understanding of disease process and disease management Date Initiated: 12/11/2016 Target Resolution Date: 03/13/2017 Goal Status: Active Verify adequate tissue perfusion prior to therapeutic compression application Date Initiated: 12/11/2016 Target Resolution Date: 03/13/2017 Goal Status: Active Interventions: Assess peripheral edema status every visit. Compression as ordered Provide education on venous insufficiency Treatment Activities: [name], [name] ([id number]) Therapeutic compression applied : 12/11/2016 Notes: Wound/Skin Impairment Nursing Diagnoses: Impaired tissue integrity Knowledge deficit related to ulceration/compromised skin integrity Goals: Patient/caregiver will verbalize understanding of skin care regimen Date Initiated: 12/11/2016 Target Resolution Date: 03/13/2017 Goal Status: Active Ulcer/skin breakdown will have a volume reduction of 30% by week 4 Date Initiated: 12/11/2016 Target Resolution Date: 03/13/2017 Goal Status: Active Ulcer/skin breakdown will have a volume reduction of 50% by week 8 Date Initiated: 12/11/2016 Target Resolution Date: 03/13/2017 Goal Status: Active Ulcer/skin breakdown will have a volume reduction of 80% by week [name] Date Initiated: 12/11/2016 Target Resolution Date: 03/13/2017 Goal Status: Active Ulcer/skin breakdown will heal within 14 weeks Date Initiated: 12/11/2016 Target Resolution Date: 03/13/2017 Goal Status: Active Interventions: Assess patient/caregiver ability to obtain necessary supplies Assess patient/caregiver ability to perform ulcer/skin care  regimen upon admission and as needed Assess ulceration(s) every visit Provide education on ulcer and skin care Treatment Activities: Skin care regimen initiated : 12/11/2016 Topical wound management initiated : 12/11/2016 Notes: Electronic Signature(s) Signed: 11/24/2018 5:36:25 PM By: Montey Hora Entered By: Montey Hora on 11/24/2018 08:21:25 [name] ([id number]) -------------------------------------------------------------------------------- Pain Assessment Details Patient Name: [name] Date of Service: 11/24/2018 8:00 AM Medical Record Number: [id number] Patient Account Number: [id number] Date of Birth/Sex: [name], [name] (37 y.o. M) Treating RN: Army Melia Primary Care Shlomie Romig: [name] Other Clinician: Referring Nakeisha Greenhouse: [name] Treating Lygia Olaes/Extender: Melburn Hake, [name]: 102 Active Problems Location of Pain Severity and Description of Pain Patient Has Paino Yes Site Locations Pain Location: Pain in Ulcers Rate the pain. Current Pain Level: 2 Pain Management and Medication Current Pain Management: Electronic Signature(s) Signed: 11/24/2018 10:47:17 AM By: Army Melia Entered By: Army Melia on 11/24/2018 08:06:16 [name] ([id number]) -------------------------------------------------------------------------------- Patient/Caregiver Education Details Patient Name: [name] Date of Service: 11/24/2018 8:00 AM Medical Record Number: [id number] Patient Account Number: [id number] Date of Birth/Gender: [name] (37 y.o. M) Treating RN: Montey Hora Primary  Care Physician: [name] Other Clinician: Referring Physician: [name] Treating Physician/Extender: Sharalyn Ink in Treatment: 102 Education Assessment Education Provided To: Patient Education Topics Provided Wound/Skin Impairment: Handouts: Other: wound care as ordered Methods: Demonstration, Explain/Verbal Responses: State content  correctly Electronic Signature(s) Signed: 11/24/2018 5:36:25 PM By: Montey Hora Entered By: Montey Hora on 11/24/2018 08:24:44 [name] ([id number]) -------------------------------------------------------------------------------- Wound Assessment Details Patient Name: [name] Date of Service: 11/24/2018 8:00 AM Medical Record Number: [id number] Patient Account Number: [id number] Date of Birth/Sex: [name] (37 y.o. M) Treating RN: Army Melia Primary Care Elyssia Strausser: [name] Other Clinician: Referring Zabella Wease: [name] Treating Henrry Feil/Extender: Melburn Hake, [name]: 102 Wound Status Wound Number: 1 Primary Etiology: Pyoderma Wound Location: Left Lower Leg - Lateral Wound Status: Open Wounding Event: Gradually Appeared Comorbid History: Sleep Apnea, Hypertension, Colitis Date Acquired: 11/18/2015 Weeks Of Treatment: 102 Clustered Wound: No Photos Wound Measurements Length: (cm) 3.4 % Reduction Width: (cm) 2 % Reduction Depth: (cm) 0.2 Epitheliali Area: (cm) 5.341 Tunneling: Volume: (cm) 1.068 Underminin in Area: -8.8% in Volume: 72.8% zation: None No g: No Wound Description Full Thickness With Exposed Support Foul Odor A Classification: Structures Slough/Fibr Wound Margin: Distinct, outline attached Exudate Large Amount: Exudate Type: Serous Exudate Color: amber fter Cleansing: No ino Yes Wound Bed Granulation Amount: Small (1-33%) Exposed Structure Granulation Quality: Red Fascia Exposed: No Necrotic Amount: Large (67-100%) Fat Layer (Subcutaneous Tissue) Exposed: Yes Necrotic Quality: Adherent Slough Tendon Exposed: No Muscle Exposed: No Joint Exposed: No Bone Exposed: No [name], [name] ([id number]) Treatment Notes Wound #1 (Left, Lateral Lower Leg) Notes TCA, BFD Electronic Signature(s) Signed: 11/24/2018 10:47:17 AM By: Army Melia Entered By: Army Melia on 11/24/2018 [name], [name]  ([id number]) -------------------------------------------------------------------------------- Vitals Details Patient Name: [name] Date of Service: 11/24/2018 8:00 AM Medical Record Number: [id number] Patient Account Number: [id number] Date of Birth/Sex: [name] (37 y.o. M) Treating RN: Army Melia Primary Care Ladean Steinmeyer: [name] Other Clinician: Referring Ravyn Nikkel: [name] Treating Demarius Archila/Extender: Melburn Hake, [name]: 102 Vital Signs Time Taken: 08:06 Temperature (F): 98.5 Height (in): 71 Pulse (bpm): 77 Weight (lbs): 338 Respiratory Rate (breaths/min): 16 Body Mass Index (BMI): 47.1 Blood Pressure (mmHg): 147/78 Reference Range: 80 - 120 mg / dl Electronic Signature(s) Signed: 11/24/2018 10:47:17 AM By: Army Melia Entered By: Army Melia on 11/24/2018 08:06:41

## 2018-11-26 ENCOUNTER — Ambulatory Visit: Payer: 59 | Admitting: Physician Assistant

## 2018-11-27 LAB — AEROBIC CULTURE W GRAM STAIN (SUPERFICIAL SPECIMEN): Culture: NORMAL

## 2018-12-04 ENCOUNTER — Telehealth: Payer: Self-pay | Admitting: Internal Medicine

## 2018-12-04 DIAGNOSIS — G4733 Obstructive sleep apnea (adult) (pediatric): Secondary | ICD-10-CM

## 2018-12-04 DIAGNOSIS — G4719 Other hypersomnia: Secondary | ICD-10-CM

## 2018-12-04 NOTE — Telephone Encounter (Signed)
Per Dr.Ram officenote has been addended and HST order has been placed. No further action needed at this time.

## 2018-12-04 NOTE — Telephone Encounter (Signed)
HST obtained and faxed to [facility],. No further action needed at this time. [name] at [facility] ([place]) notifed.

## 2018-12-04 NOTE — Telephone Encounter (Signed)
Order placed as requested per Dr.Ramachandrans 11/12/2018 ov. Will forward message to doctor for review.

## 2018-12-04 NOTE — Telephone Encounter (Signed)
Per [name] at [place], they have the sleep study from 2015, however, [facility] does not have the F2F notes that go along with that study.  [facility] has contacted former doctor's office 3 times and have been unable to obtain.  The patient has reached out to the former physician office x 3 to try to obtain these records and former office is not returning his calls either.  At this point, pt is unable to obtain a new CPAP device without F2F before 10/2013 study.   Dr. Ashby Dawes will need to go into the June 2020 OV note and addendum his office note that patient is to proceed with HST. Pt will have HST and then will be able to obtain New CPAP. [name]

## 2018-12-07 ENCOUNTER — Other Ambulatory Visit: Payer: Self-pay | Admitting: Internal Medicine

## 2018-12-07 DIAGNOSIS — G4719 Other hypersomnia: Secondary | ICD-10-CM

## 2018-12-08 ENCOUNTER — Other Ambulatory Visit: Payer: Self-pay

## 2018-12-08 ENCOUNTER — Encounter: Payer: 59 | Admitting: Physician Assistant

## 2018-12-08 DIAGNOSIS — L97222 Non-pressure chronic ulcer of left calf with fat layer exposed: Secondary | ICD-10-CM | POA: Diagnosis not present

## 2018-12-09 NOTE — Progress Notes (Signed)
[name], [name] ([id number]) Visit Report for 12/08/2018 Chief Complaint Document Details Patient Name: [name], [name] Date of Service: 12/08/2018 8:15 AM Medical Record Number: [id number] Patient Account Number: [id number] Date of Birth/Sex: [birth year] (38 y.o. 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 M) Treating RN: Montey Hora Primary Care Provider: [name] Other Clinician: Referring Provider: [name] Treating Provider/Extender: Melburn Hake, Latressa Harries Weeks in Treatment: 104 Debridement Performed for Wound #1 Left,Lateral Lower Leg Assessment: Performed By: Clinician Montey Hora, RN Debridement Type: Chemical/Enzymatic/Mechanical Agent Used: Santyl Level of Consciousness (Pre- Awake and Alert procedure): Pre-procedure Verification/Time Yes - 08:51 Out Taken: Start Time: 08:51 Pain Control: Lidocaine 4% Topical Solution Instrument: Other : tongue blade Bleeding: None End Time: 08:52 Procedural Pain: 0 Post Procedural Pain: 0 Response to Treatment: Procedure was tolerated well Level of Consciousness Awake and Alert (Post-procedure): Post Debridement Measurements of Total Wound Length: (cm) 2.7 Width: (cm) 1.7 Depth: (cm) 0.2 Volume: (cm) 0.721 Character of Wound/Ulcer Post  Debridement: Requires Further Debridement Post Procedure Diagnosis Same as Pre-procedure Electronic Signature(s) Signed: 12/08/2018 4:59:24 PM By: Montey Hora Signed: 12/08/2018 6:14:16 PM By: Worthy Keeler PA-C Entered By: Montey Hora on 12/08/2018 08:52:23 [name] ([id number]) -------------------------------------------------------------------------------- HPI Details Patient Name: [name] Date of Service: 12/08/2018 8:15 AM Medical Record Number: [id number] Patient Account Number: [id number] Date of Birth/Sex: [birth year] (37 y.o. M) Treating RN: Montey Hora Primary Care Provider: [name] Other Clinician: Referring Provider: [name] Treating Provider/Extender: Melburn Hake, Graham Hyun Weeks in Treatment: 104 History of Present Illness HPI Description: 12/04/16; 38 year old man who comes into the clinic today for review of a wound on the posterior left calf. He tells me that is been there for about a year. He is not a diabetic he does smoke half a pack per day. He was seen in the ER on 11/20/16 felt to have cellulitis around the wound and was given clindamycin. An x-ray did not show osteomyelitis. The patient initially tells me that he has a milk allergy that sets off a pruritic itching rash on his lower legs which she scratches incessantly and he thinks that's what may have set up the wound. He has been using various topical antibiotics and ointments without any effect. He works in a [employer] and is on his feet all day. He does not have a prior history of wounds however he does have the rash on both lower legs the right arm and the ventral aspect of his left arm. These are excoriations and clearly have had scratching however there are of macular looking areas on both legs including a substantial larger area on the right leg. This does not have an underlying open area. There is no blistering. The patient tells me that 2 years ago in [place] in response to the  rash on his legs he saw a dermatologist who told him he had a condition which may be pyoderma gangrenosum although I may be putting words into his mouth. He seemed to recognize this. On further questioning he admits to  a 5 year history of quiesced. ulcerative colitis. He is not in any treatment for this. He's had no recent travel 12/11/16; the patient arrives today with his wound and roughly the same condition we've been using silver alginate this is a deep punched out wound with some surrounding erythema but no tenderness. Biopsy I did did not show confirmed pyoderma gangrenosum suggested nonspecific inflammation and vasculitis but does not provide an actual description of what was seen by the pathologist. I'm really not able to understand this We have also received information from the patient's dermatologist in [place] notes from April 2016. This was a doctor Agarwal- antal. 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 He was prescribed topical steroid high potency under occlusion which helped but at this point the patient did not have a deep punched out wound. 12/18/16; the patient's wound is larger in terms of surface area however this surface looks better and there is less depth. The surrounding erythema also is better. The patient states that the wrap we put on came off 2 days ago when he has been using his compression stockings. He we are in the process of getting a dermatology consult. 12/26/16 on evaluation today patient's left lower extremity wound shows evidence of infection with surrounding erythema noted. He has been tolerating the dressing changes but states that he has noted more discomfort. There is a larger area of erythema surrounding the wound. No fevers, chills, nausea, or vomiting noted at this time. With that being said the wound still does have slough covering the surface. He is not allergic to any medication that he is aware of at this point. In regard to his  right lower extremity he had several regions that are erythematous and pruritic he wonders if there's anything we can do to help that. 01/02/17 I reviewed patient's wound culture which was obtained his visit last week. He was placed on doxycycline at that point. Unfortunately that does not appear to be an antibiotic that would likely help with the situation however the pseudomonas noted on culture is sensitive to Cipro. Also unfortunately patient's wound seems to have a large compared to last week's evaluation. Not severely so but there are definitely increased measurements in general. He is continuing to have discomfort as well he writes this to be a seven out of 10. In fact he would prefer me not to perform any debridement today due to the fact that he is having discomfort and considering he has an active infection on the little reluctant to do so anyway. No fevers, chills, nausea, or vomiting noted at this time. 01/08/17; patient seems dermatology on September 5. I suspect dermatology will want the slides from the biopsy I did sent to their pathologist. I'm not sure if there is a way we can expedite that. In any case the culture I did before I left on vacation 3 weeks ago showed Pseudomonas he was given 10 days of Cipro and per her description of her intake nurses is actually somewhat better this week although the wound is quite a bit bigger than I remember the last time I saw this. He still has 3 more days of Cipro WILLOUGHBY, DOELL ([id number]) 01/21/17; dermatology appointment tomorrow. He has completed the ciprofloxacin for Pseudomonas. Surface of the wound looks better however he is had some deterioration in the lesions on his right leg. Meantime the left lateral leg wound we will continue with sample 01/29/17; patient had his dermatology appointment but I can't yet see  that note. He is completed his antibiotics. The wound is more superficial but considerably larger in circumferential area than  when he came in. This is in his left lateral calf. He also has swollen erythematous areas with superficial wounds on the right leg and small papular areas on both arms. There apparently areas in her his upper thighs and buttocks I did not look at those. Dermatology biopsied the right leg. Hopefully will have their input next week. 02/05/17; patient went back to see his dermatologist who told him that he had a "scratching problem" as well as staph. He is now on a 30 day course of doxycycline and I believe she gave him triamcinolone cream to the right leg areas to help with the itching [not exactly sure but probably triamcinolone]. She apparently looked at the left lateral leg wound although this was not rebiopsied and I think felt to be ultimately part of the same pathogenesis. He is using sample border foam and changing nevus himself. He now has a new open area on the right posterior leg which was his biopsy site I don't have any of the dermatology notes 02/12/17; we put the patient in compression last week with SANTYL to the wound on the left leg and the biopsy. Edema is much better and the depth of the wound is now at level of skin. Area is still the same oBiopsy site on the right lateral leg we've also been using santyl with a border foam dressing and he is changing this himself. 02/19/17; Using silver alginate started last week to both the substantial left leg wound and the biopsy site on the right wound. He is tolerating compression well. Has a an appointment with his primary M.D. tomorrow wondering about diuretics although I'm wondering if the edema problem is actually lymphedema 02/26/17; the patient has been to see his primary doctor Dr. Jerrel Ivory at [facility] our primary care. She started him on Lasix 20 mg and this seems to have helped with the edema. However we are not making substantial change with the left lateral calf wound and inflammation. The biopsy site on the right leg also looks  stable but not really all that different. 03/12/17; the patient has been to see vein and vascular Dr. Lucky Cowboy. He has had venous reflux studies I have not reviewed these. I did get a call from his dermatology office. 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 They therefore recommended clobetasol and tetracycline i.e. 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 03/26/17; apparently the patient just had reflux studies not an appointment with Dr. dew. She arrives in clinic today having applied clobetasol for 2-3 weeks. He notes over the last 2-3 days excessive drainage having to change the dressing 3-4 times a day and also expanding erythema. 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 Lillia Carmel at [facility] dermatology locally who kindly saw him at my request. 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 My concern was the degree of erythema medially extending from his left leg wound which was either inflammation from pyoderma or cellulitis. I put him on Augmentin however culture of the wound showed Pseudomonas which is quinolone sensitive. I really don't believe he has cellulitis however in view of everything I will continue and give him a course of Cipro. He is also on doxycycline as an immune modulator for the pyoderma. In addition to his original wound on the left lateral leg with surrounding erythema he has a wound on  the right posterior calf which was an original biopsy site done by dermatology. 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 Saw Dr. Lillia Carmel yesterday. He has been using topical antibiotics to both wound areas his original wound on the left and the biopsies/pathergy area on the right. There is definitely some improvement in the inflammation around the wound on the right although the patient states he has increasing sensitivity of the wounds. He is on prednisone 60 and doxycycline 1 as prescribed by Dr. Lillia Carmel. He is covering the topical antibiotic with gauze and putting this in his own compression stocks and changing this daily. He states that Dr. Lottie Rater did a culture of the left leg wound yesterday 05/07/17; pyoderma gangrenosum. The patient saw Dr. Lillia Carmel yesterday and has a follow-up with her in one month. He is still using topical antibiotics to both wounds although he can't recall exactly what type. He is still on prednisone 60 mg. Dr. Lillia Carmel stated that the doxycycline could stop if we were in agreement. 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 The right medial leg was induced by biopsy/pathergy. The area on the right is essentially healed. 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 The right medial leg has closed and remains closed. He is still on prednisone 60. oHe tells me he missed his last dermatology appointment with Dr. Lillia Carmel but will make another appointment. He reports that her blood sugar at a recent screen in [place] was high 200's. He was 180 today. He is more cushingoid blood pressure is Ranta, Smayan ([id number]) up a bit. I think he is going to require still much longer prednisone perhaps another 3 months  before attempting to taper. In the meantime his wound is a lot better. Smaller. He is cleaning this off daily and applying topical antibiotics. When he was last in the clinic I thought about changing to [facility] and actually put in a couple of calls to dermatology although probably not during their business hours. 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 He continues on oral prednisone. He has been using triple antibiotic ointment. There is surface debris and we will transition to [facility] and have him return in 2 weeks. He has lost 30 pounds since his last appointment with lifestyle modification. He may benefit from topical steroid cream for treatment this can be considered at a later date. 08/22/17 on evaluation today patient appears to actually be doing rather well in regard to his left lateral lower extremity ulcer. He has actually been managed by Dr. Dellia Nims most recently. Patient is currently on oral steroids at this time. This seems to  have been of benefit for him. Nonetheless his last visit was actually with Leah on 08/06/17. Currently he is not utilizing any topical steroid creams although this could be of benefit as well. No fevers, chills, nausea, or vomiting noted at this time. 09/05/17 on evaluation today patient appears to be doing better in regard to his left lateral lower extremity ulcer. He has been tolerating the dressing changes without complication. He is using Santyl with good effect. Overall I'm very pleased with how things are standing at this point. Patient likewise is happy that this is doing better. 09/19/17 on evaluation today patient actually appears to be doing rather well in regard to his left lateral lower extremity ulcer. 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 He's not  having any significant pain. 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 He has been tolerating the Santyl without complication and in general I feel like he's making good progress. 10/17/17 on evaluation today patient appears to be doing very well in regard to his left lateral lower surety ulcer. He has been tolerating the dressing changes without complication. There does not appear to be any evidence of infection he's alternating the Santyl and the triple antibiotic ointment every other day this seems to be doing well for him. 11/03/17 on evaluation today patient appears to be doing very well in regard to his left lateral lower extremity ulcer. He is been tolerating the dressing changes without complication which is good news. Fortunately there does not appear to be any evidence of infection which is also great news. Overall is doing excellent they are starting to taper down on the prednisone is down to 40 mg at this point it also started topical clobetasol for him. 11/17/17 on evaluation today patient appears to be doing well in regard to his left lateral lower surety ulcer. He's been tolerating the dressing changes without complication. He does note that he is having no pain, no excessive drainage or discharge, and overall he feels like things are going about how he would expect and hope they would. Overall he seems to have no evidence of infection at this time in my opinion which is good news. 12/04/17-He is seen in follow-up evaluation for right lateral lower extremity ulcer. He has been applying topical steroid cream. Today's measurement show slight increase in size. Over the next 2 weeks we will transition to every other day Santyl and steroid cream. He has been encouraged to monitor for changes and notify clinic with any concerns 12/15/17 on evaluation today patient's left lateral motion  the ulcer and fortunately is doing worse again at this point. This just since last week to this week has close to doubled in size according to the patient. I did not seeing last week's I do not have a visual to compare this to in our system was also down so we do not have all the charts and at this point. Nonetheless it does have me somewhat concerned in regard to the fact that again he was worried enough about it he has contact the dermatology that placed them back on the full strength, 50 mg a day of the prednisone that he was taken previous. He continues to alternate using clobetasol along with Santyl at this point. He is obviously somewhat frustrated. 12/22/17 on evaluation today patient appears to  be doing a little worse compared to last evaluation. Unfortunately the wound is a little deeper and slightly larger than the last week's evaluation. With that being said he has made some progress in regard to the irritation surrounding at this time unfortunately despite that progress that's been made he still has a significant issue going on here. 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 For that reason I discussed with him today the possibility of perform a wound culture to ensure there's nothing overtly infected. 01/06/18 on evaluation today patient's wound is larger and deeper than previously evaluated. With that being said it did appear that his wound was infected after my last evaluation with him. Subsequently I did end up prescribing a prescription for Bactrim DS which she has been taking and having no complication with. Fortunately there does not appear to be any evidence of [name], Pritesh ([id number]) infection at this point in time as far as anything spreading, no want to touch, and overall I feel like things are showing signs  of improvement. 01/13/18 on evaluation today patient appears to be even a little larger and deeper than last time. There still muscle exposed in the base of the wound. Nonetheless he does appear to be less erythematous I do believe inflammation is calming down also believe the infection looks like it's probably resolved at this time based on what I'm seeing. No fevers, chills, nausea, or vomiting noted at this time. 01/30/18 on evaluation today patient actually appears to visually look better for the most part. Unfortunately those visually this looks better he does seem to potentially have what may be an abscess in the muscle that has been noted in the central portion of the wound. This is the first time that I have noted what appears to be fluctuance in the central portion of the muscle. With that being said I'm somewhat more concerned about the fact that this might indicate an abscess formation at this location. I do believe that an ultrasound would be appropriate. This is likely something we need to try to do as soon as possible. He has been switch to mupirocin ointment and he is no longer using the steroid ointment as prescribed by dermatology he sees them again next week he's been decreased from 60 to 40 mg of prednisone. 03/09/18 on evaluation today patient actually appears to be doing a little better compared to last time I saw him. There's not as much erythema surrounding the wound itself. He I did review his most recent infectious disease note which was dated 02/24/18. He saw Dr. Michel Bickers in [place]. 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 Patient is now off of antibiotics and they are continually observing this. There seems to be no change in the past two weeks in my pinion based on what the patient says and what I see today compared to what Dr. Megan Salon likely saw two weeks ago. No fevers,  chills, nausea, or vomiting noted at this time. 03/23/18 on evaluation today patient's wound actually appears to be showing signs of improvement which is good news. He is currently still on the Dapsone. He is also working on tapering the prednisone to get off of this and Dr. Lottie Rater is working with him in this regard. Nonetheless overall  I feel like the wound is doing well it does appear based on the infectious disease note that I reviewed from Dr. 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 I did also review the results of his ultrasound of left lower extremity which revealed there was a dentist tissue in the base of the wound without an abscess noted. 04/06/18 on evaluation today the patient's left lateral lower extremity ulcer actually appears to be doing fairly well which is excellent news. There does not appear to be any evidence of infection at this time which is also great news. Overall he still does have a significantly large ulceration although little by little he seems to be making progress. He is down to 10 mg a day of the prednisone. 04/20/18 on evaluation today patient actually appears to be doing excellent at this time in regard to his left lower extremity ulcer. He's making signs of good progress unfortunately this is taking much longer than we would really like to see but nonetheless he is making progress. Fortunately there does not appear to be any evidence of infection at this time. No fevers, chills, nausea, or vomiting noted at this time. The patient has not been using the Santyl due to the cost he hadn't got in this field yet. He's mainly been using the antibiotic ointment topically. Subsequently he also tells me that he really has not been scrubbing in the shower I think this would be helpful again as I told him it doesn't have to be anything too aggressive to  even make it believe just enough to keep it free of some of the loose slough and biofilm on the wound surface. 05/11/18 on evaluation today patient's wound appears to be making slow but sure progress in regard to the left lateral lower extremity ulcer. He is been tolerating the dressing changes without complication. Fortunately there does not appear to be any evidence of infection at this time. He is still just using triple antibiotic ointment along with clobetasol occasionally over the area. He never got the Santyl and really does not seem to intend to in my pinion. 06/01/18 on evaluation today patient appears to be doing a little better in regard to his left lateral lower extremity ulcer. He states that overall he does not feel like he is doing as well with the Dapsone as he did with the prednisone. Nonetheless he sees his dermatologist later today and is gonna talk to them about the possibility of going back on the prednisone. Overall again I believe that the wound would be better if you would utilize Santyl but he really does not seem to be interested in going back to the [place] at this point. He has been using triple antibiotic ointment. 06/15/18 on evaluation today patient's wound actually appears to be doing about the same at this point. Fortunately there is no signs of infection at this time. He has made slight improvements although he continues to not really want to clean the wound bed at this point. He states that he just doesn't mess with it he doesn't want to cause any problems with everything else he has going on. He has been on medication, antibiotics as prescribed by his dermatologist, for a staff infection of his lower extremities which is really drying out now and looking much better he tells me. Fortunately there is no sign of overall infection. Blanck,  CHIDERA ([id number]) 06/29/18 on evaluation today patient appears to be doing well in regard to his left lateral lower surety ulcer all  things considering. Fortunately his staff infection seems to be greatly improved compared to previous. He has no signs of infection and this is drying up quite nicely. He is still the doxycycline for this is no longer on cental, Dapsone, or any of the other medications. His dermatologist has recommended possibility of an infusion but right now he does not want to proceed with that. 07/13/18 on evaluation today patient appears to be doing about the same in regard to his left lateral lower surety ulcer. Fortunately there's no signs of infection at this time which is great news. Unfortunately he still builds up a significant amount of Slough/biofilm of the surface of the wound he still is not really cleaning this as he should be appropriately. Again I'm able to easily with saline and gauze remove the majority of this on the surface which if you would do this at home would likely be a dramatic improvement for him as far as getting the area to improve. Nonetheless overall I still feel like he is making progress is just very slow. I think Santyl will be of benefit for him as well. Still he has not gotten this as of this point. 07/27/18 on evaluation today patient actually appears to be doing little worse in regards of the erythema around the periwound region of the wound he also tells me that he's been having more drainage currently compared to what he was experiencing last time I saw him. He states not quite as bad as what he had because this was infected previously but nonetheless is still appears to be doing poorly. Fortunately there is no evidence of systemic infection at this point. The patient tells me that he is not going to be able to afford the Santyl. He is still waiting to hear about the infusion therapy with his dermatologist. Apparently she wants an updated colonoscopy first. 08/10/18 on evaluation today patient appears to be doing better in regard to his left lateral lower extremity ulcer.  Fortunately he is showing signs of improvement in this regard he's actually been approved for Remicade infusion's as well although this has not been scheduled as of yet. Fortunately there's no signs of active infection at this time in regard to the wound although he is having some issues with infection of the right lower extremity is been seen as dermatologist for this. Fortunately they are definitely still working with him trying to keep things under control. 09/07/18 on evaluation today patient is actually doing rather well in regard to his left lateral lower extremity ulcer. He notes these actually having some hair grow back on his extremity which is something he has not seen in years. He also tells me that the pain is really not giving them any trouble at this time which is also good news overall she is very pleased with the progress he's using a combination of the mupirocin along with the probate is all mixed. 09/21/18 on evaluation today patient actually appears to be doing fairly well all things considered in regard to his looks from the ulcer. He's been tolerating the dressing changes without complication. Fortunately there's no signs of active infection at this time which is good news he is still on all antibiotics or prevention of the staff infection. He has been on prednisone for time although he states it is gonna contact his dermatologist and see if  she put them on a short course due to some irritation that he has going on currently. Fortunately there's no evidence of any overall worsening this is going very slow I think cental would be something that would be helpful for him although he states that $50 for tube is quite expensive. He therefore is not willing to get that at this point. 10/06/18 on evaluation today patient actually appears to be doing decently well in regard to his left lateral leg ulcer. He's been tolerating the dressing changes without complication. Fortunately there's no  signs of active infection at this time. Overall I'm actually rather pleased with the progress he's making although it's slow he doesn't show any signs of infection and he does seem to be making some improvement. I do believe that he may need a switch up and dressings to try to help this to heal more appropriately and quickly. 10/19/18 on evaluation today patient actually appears to be doing better in regard to his left lateral lower extremity ulcer. This is shown signs of having much less Slough buildup at this point due to the fact he has been using the [employer]. Obviously this is very good news. The overall size of the wound is not dramatically smaller but again the appearance is. 11/02/18 on evaluation today patient actually appears to be doing quite well in regard to his lower [place] ulcer. A lot of the skin around the ulcer is actually somewhat irritating at this point this seems to be more due to the dressing causing irritation from the adhesive that anything else. Fortunately there is no signs of active infection at this time. 11/24/18 on evaluation today patient appears to be doing a little worse in regard to his overall appearance of his lower extremity ulcer. There's more erythema and warmth around the wound unfortunately. He is currently on doxycycline which he has been on for some time. With that being said I'm not sure that seems to be helping with what appears to possibly be an acute cellulitis with regard to his left lower extremity ulcer. No fevers, chills, nausea, or vomiting noted at this time. 12/08/18 on evaluation today patient's wounds actually appears to be doing significantly better compared to his last evaluation. He has been using Santyl along with alternating tripling about appointment as well as the steroid cream seems to be doing Dy, Braedan ([id number]) quite well and the wound is showing signs of improvement which is excellent news. Fortunately there's no evidence  of infection and in fact his culture came back negative with only normal skin flora noted. Electronic Signature(s) Signed: 12/08/2018 6:14:16 PM By: Worthy Keeler PA-C Entered By: Worthy Keeler on 12/08/2018 09:19:32 ALEXEY, RHOADS ([id number]) -------------------------------------------------------------------------------- Physical Exam Details Patient Name: [name] Date of Service: 12/08/2018 8:15 AM Medical Record Number: [id number] Patient Account Number: [id number] Date of Birth/Sex: 09-27-1980 (37 y.o. M) Treating RN: Montey Hora Primary Care Provider: [name] Other Clinician: Referring Provider: [name] Treating Provider/Extender: Melburn Hake, Shadow Schedler Weeks in Treatment: 59 Constitutional Well-nourished and well-hydrated in no acute distress. Respiratory normal breathing without difficulty. clear to auscultation bilaterally. Cardiovascular regular rate and rhythm with normal S1, S2. Psychiatric this patient is able to make decisions and demonstrates good insight into disease process. Alert and Oriented x 3. pleasant and cooperative. Notes On inspection today patient's wound bed actually showed signs of improvement with some good epithelialization around the edge and the slough was not nearly as thick and adherent today. Overall I feel like he's  doing well in this regard. Electronic Signature(s) Signed: 12/08/2018 6:14:16 PM By: Worthy Keeler PA-C Entered By: Worthy Keeler on 12/08/2018 09:20:01 AAIDEN, DEPOY ([id number]) -------------------------------------------------------------------------------- Physician Orders Details Patient Name: [name] Date of Service: 12/08/2018 8:15 AM Medical Record Number: [id number] Patient Account Number: [id number] Date of Birth/Sex: [birth year] (37 y.o. 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 Anesthetic (add to Medication List) Wound #1 Left,Lateral Lower Leg o Topical Lidocaine 4% cream applied to wound bed prior to debridement (In Clinic Only). Primary Wound Dressing Wound #1 Left,Lateral Lower Leg o Santyl Ointment Secondary Dressing Wound #1 Left,Lateral Lower Leg o Boardered Foam Dressing Dressing Change Frequency Wound #1 Left,Lateral Lower Leg o Change dressing every day. Follow-up Appointments Wound #1 Left,Lateral Lower Leg o Return Appointment in 2 weeks. Edema Control Wound #1 Left,Lateral Lower Leg o Elevate legs to the level of the heart and pump ankles as often as possible Medications-please add to medication list. Wound #1 Left,Lateral Lower Leg o Santyl Enzymatic Ointment Electronic Signature(s) [name], [name] ([id number]) Signed: 12/08/2018 4:59:24 PM By: Montey Hora Signed: 12/08/2018 6:14:16 PM By: Worthy Keeler PA-C Entered By: Montey Hora on 12/08/2018 08:47:28 [name] ([id number]) -------------------------------------------------------------------------------- Problem List Details Patient Name: [name] Date of Service: 12/08/2018 8:15 AM Medical Record Number: [id number] Patient Account Number: [id number] Date of Birth/Sex: 15-Sep-1980 (37 y.o. M) Treating RN: Montey Hora Primary Care Provider: [name] Other Clinician: Referring Provider: [name] Treating Provider/Extender: Melburn Hake, Tewana Bohlen Weeks in Treatment: 104 Active Problems ICD-10 Evaluated Encounter Code Description Active Date Today Diagnosis L97.222 Non-pressure chronic ulcer of left calf with  fat layer exposed 12/04/2016 No Yes L88 Pyoderma gangrenosum 03/26/2017 No Yes I87.2 Venous insufficiency (chronic) (peripheral) 12/04/2016 No Yes Inactive Problems ICD-10 Code Description Active Date Inactive Date L97.213 Non-pressure chronic ulcer of right calf with necrosis of muscle 04/02/2017 04/02/2017 Resolved Problems Electronic Signature(s) Signed: 12/08/2018 6:14:16 PM By: Worthy Keeler PA-C Entered By: Worthy Keeler on 12/08/2018 08:26:26 [name] ([id number]) -------------------------------------------------------------------------------- Progress Note Details Patient Name: [name] Date of Service: 12/08/2018 8:15 AM Medical Record Number: [id number] Patient Account Number: [id number] Date of Birth/Sex: December 08, 1980 (37 y.o. M) Treating RN: Montey Hora Primary Care Provider: [name] Other Clinician: Referring Provider: [name] Treating Provider/Extender: Melburn Hake, Lajuanna Pompa Weeks in Treatment: 104 Subjective Chief Complaint Information obtained from Patient He is here in follow up evaluation for LLE pyoderma ulcer History of Present Illness (HPI) 12/04/16; 38 year old man who comes into the clinic today for review of a wound on the posterior left calf. He tells me that is been there for about a year. He is not a diabetic he does smoke half a pack per day. He was seen in the ER on 11/20/16 felt to have cellulitis around the wound and was given clindamycin. An x-ray did not show osteomyelitis. The patient initially tells me that he has a milk allergy that sets off a pruritic itching rash on his lower legs which she scratches incessantly and he thinks that's what may have set up the wound. He has been using various topical antibiotics and ointments without any effect. He works in  a [employer] and is on his feet all day. He does not have a prior history of wounds however he does have the rash on both lower legs the right arm and the ventral aspect of his  left arm. These are excoriations and clearly have had scratching however there are of macular looking areas on both legs including a substantial larger area on the right leg. This does not have an underlying open area. There is no blistering. The patient tells me that 2 years ago in [place] in response to the rash on his legs he saw a dermatologist who told him he had a condition which may be pyoderma gangrenosum although I may be putting words into his mouth. He seemed to recognize this. 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 He is not in any treatment for this. He's had no recent travel 12/11/16; the patient arrives today with his wound and roughly the same condition we've been using silver alginate this is a deep punched out wound with some surrounding erythema but no tenderness. Biopsy I did did not show confirmed pyoderma gangrenosum suggested nonspecific inflammation and vasculitis but does not provide an actual description of what was seen by the pathologist. I'm really not able to understand this We have also received information from the patient's dermatologist in [place] notes from April 2016. This was a doctor Agarwal- antal. 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 He was prescribed topical steroid high potency under occlusion which helped but at this point the patient did not have a deep punched out wound. 12/18/16; the patient's wound is larger in terms of surface area however this surface looks better and there is less depth. The surrounding erythema also is better. The patient states that the wrap we put on came off 2 days ago when he has been using his compression stockings. He we are in the process of getting a dermatology consult. 12/26/16 on evaluation today patient's left lower extremity wound shows evidence of infection with surrounding erythema noted. He has been tolerating the dressing changes but states that he has noted  more discomfort. There is a larger area of erythema surrounding the wound. No fevers, chills, nausea, or vomiting noted at this time. With that being said the wound still does have slough covering the surface. He is not allergic to any medication that he is aware of at this point. In regard to his right lower extremity he had several regions that are erythematous and pruritic he wonders if there's anything we can do to help that. 01/02/17 I reviewed patient's wound culture which was obtained his visit last week. He was placed on doxycycline at that point. Unfortunately that does not appear to be an antibiotic that would likely help with the situation however the pseudomonas noted on culture is sensitive to Cipro. Also unfortunately patient's wound seems to have a large compared to last week's evaluation. Not severely so but there are definitely increased measurements in general. He is continuing to have discomfort as well he writes this to be a seven out of 10. In fact he would prefer me not to perform any debridement today due to the fact that he is having discomfort and considering he has an active infection on the little reluctant to do so anyway. No fevers, Bentley, Andrey ([id number]) chills, nausea, or vomiting noted at this time. 01/08/17; patient seems dermatology on September 5. I suspect dermatology will want the slides from the  biopsy I did sent to their pathologist. I'm not sure if there is a way we can expedite that. In any case the culture I did before I left on vacation 3 weeks ago showed Pseudomonas he was given 10 days of Cipro and per her description of her intake nurses is actually somewhat better this week although the wound is quite a bit bigger than I remember the last time I saw this. He still has 3 more days of Cipro 01/21/17; dermatology appointment tomorrow. He has completed the ciprofloxacin for Pseudomonas. Surface of the wound looks better however he is had some deterioration  in the lesions on his right leg. Meantime the left lateral leg wound we will continue with sample 01/29/17; patient had his dermatology appointment but I can't yet see that note. He is completed his antibiotics. The wound is more superficial but considerably larger in circumferential area than when he came in. This is in his left lateral calf. He also has swollen erythematous areas with superficial wounds on the right leg and small papular areas on both arms. There apparently areas in her his upper thighs and buttocks I did not look at those. Dermatology biopsied the right leg. Hopefully will have their input next week. 02/05/17; patient went back to see his dermatologist who told him that he had a "scratching problem" as well as staph. He is now on a 30 day course of doxycycline and I believe she gave him triamcinolone cream to the right leg areas to help with the itching [not exactly sure but probably triamcinolone]. She apparently looked at the left lateral leg wound although this was not rebiopsied and I think felt to be ultimately part of the same pathogenesis. He is using sample border foam and changing nevus himself. He now has a new open area on the right posterior leg which was his biopsy site I don't have any of the dermatology notes 02/12/17; we put the patient in compression last week with SANTYL to the wound on the left leg and the biopsy. Edema is much better and the depth of the wound is now at level of skin. Area is still the same Biopsy site on the right lateral leg we've also been using santyl with a border foam dressing and he is changing this himself. 02/19/17; Using silver alginate started last week to both the substantial left leg wound and the biopsy site on the right wound. He is tolerating compression well. Has a an appointment with his primary M.D. tomorrow wondering about diuretics although I'm wondering if the edema problem is actually lymphedema 02/26/17; the patient has  been to see his primary doctor Dr. Jerrel Ivory at [facility] our primary care. She started him on Lasix 20 mg and this seems to have helped with the edema. However we are not making substantial change with the left lateral calf wound and inflammation. The biopsy site on the right leg also looks stable but not really all that different. 03/12/17; the patient has been to see vein and vascular Dr. Lucky Cowboy. He has had venous reflux studies I have not reviewed these. I did get a call from his dermatology office. 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 They therefore recommended  clobetasol and tetracycline i.e. 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 03/26/17; apparently the patient just had reflux studies not an appointment with Dr. dew. She arrives in clinic today having applied clobetasol for 2-3 weeks. He notes over the last 2-3 days excessive drainage having to change the dressing 3-4 times a day and also expanding erythema. 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 Lillia Carmel at [facility] dermatology locally who kindly saw him at my request. 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 My concern was the degree of erythema medially extending from his left leg wound which was either inflammation from pyoderma or cellulitis. I put him on Augmentin however culture of the wound showed Pseudomonas which is quinolone  sensitive. I really don't believe he has cellulitis however in view of everything I will continue and give him a course of Cipro. He is also on doxycycline as an immune modulator for the pyoderma. In addition to his original wound on the left lateral leg with surrounding erythema he has a wound on the right posterior calf which was an original biopsy site done by dermatology. 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 Saw Dr. Lillia Carmel yesterday. He has been using topical antibiotics to both wound areas his original wound on the left and the biopsies/pathergy area on the right. There is definitely some improvement in the inflammation around the wound on the right although the patient states he has increasing sensitivity of the wounds. He is on prednisone 60 and doxycycline 1 as prescribed by Dr. Lillia Carmel. He is covering the topical antibiotic with gauze and putting this in his own compression stocks and changing this daily. He states that Dr. Lottie Rater did a culture of the left leg wound yesterday 05/07/17; pyoderma gangrenosum. The patient saw Dr. Lillia Carmel yesterday and has a follow-up with her in one month. He is still using topical antibiotics to both wounds although he can't recall exactly what type. He is still on prednisone 60 mg. Dr. Lillia Carmel stated that the doxycycline could stop if we were in agreement. 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 The right medial leg was induced by [name] ([id number]) biopsy/pathergy. The area on the right is essentially healed. 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 The right medial leg has closed and remains closed. He is still on prednisone 60. He tells me he missed his last dermatology  appointment with Dr. Lillia Carmel but will make another appointment. He reports that her blood sugar at a recent screen in [place] was high 200's. He was 180 today. 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 I think he is going to require still much longer prednisone perhaps another 3 months before attempting to taper. In the meantime his wound is a lot better. Smaller. He is cleaning this off daily and applying topical antibiotics. When he was last in the clinic I thought about changing to [facility] and actually put in a couple of calls to dermatology although probably not during their business hours.  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 He continues on oral prednisone. He has been using triple antibiotic ointment. There is surface debris and we will transition to [employer] and have him return in 2 weeks. He has lost 30 pounds since his last appointment with lifestyle modification. He may benefit from topical steroid cream for treatment this can be considered at a later date. 08/22/17 on evaluation today patient appears to actually be doing rather well in regard to his left lateral lower extremity ulcer. He has actually been managed by Dr. Dellia Nims most recently. Patient is currently on oral steroids at this time. This seems to have been of benefit for him. Nonetheless his last visit was actually with Leah on 08/06/17. Currently he is not utilizing any topical steroid creams although this could be of benefit as well. No fevers, chills, nausea, or vomiting noted at this time. 09/05/17 on evaluation today patient appears to be doing better in regard to his left lateral lower extremity ulcer. He has been tolerating the dressing changes without complication. He is using Santyl with good effect. Overall I'm very pleased with how things are standing at this point. Patient  likewise is happy that this is doing better. 09/19/17 on evaluation today patient actually appears to be doing rather well in regard to his left lateral lower extremity ulcer. 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 He's not having any significant pain. 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 He has been tolerating the Santyl without complication and in general I feel like he's making good progress. 10/17/17 on evaluation today patient appears to be doing very well in regard to his left lateral lower surety ulcer. He has been tolerating the dressing changes without complication. There does not appear to be any evidence of infection he's alternating the Santyl and the triple antibiotic ointment every other day this seems to be doing well for him. 11/03/17 on evaluation today patient appears to be doing very well in regard to his left lateral lower extremity ulcer. He is been tolerating the dressing changes without complication which is good news. Fortunately there does not appear to be any evidence of infection which is also great news. Overall is doing excellent they are starting to taper down on the prednisone is down to 40 mg at this point it also started topical clobetasol for him. 11/17/17 on evaluation today patient appears to be doing well in regard to his left lateral lower surety ulcer. He's been tolerating the dressing changes without complication. He does note that he is having no pain, no excessive drainage or discharge, and overall he feels like things are going about how he would expect and hope they would. Overall he seems to have no evidence of infection at this time in my opinion which is good news. 12/04/17-He is seen in follow-up evaluation for right lateral lower extremity ulcer. He has been  applying topical steroid cream. Today's measurement show slight increase in size. Over the next 2 weeks we will transition to every other day Santyl and steroid cream. He has been encouraged to monitor for changes and notify clinic with any concerns 12/15/17 on evaluation today patient's left  lateral motion the ulcer and fortunately is doing worse again at this point. This just since last week to this week has close to doubled in size according to the patient. I did not seeing last week's I do not have a visual to compare this to in our system was also down so we do not have all the charts and at this point. Nonetheless it does have me somewhat concerned in regard to the fact that again he was worried enough about it he has contact the dermatology that placed them back on the full strength, 50 mg a day of the prednisone that he was taken previous. He continues to alternate using clobetasol along with Santyl at this point. He is obviously somewhat frustrated. 12/22/17 on evaluation today patient appears to be doing a little worse compared to last evaluation. Unfortunately the wound is a little deeper and slightly larger than the last week's evaluation. With that being said he has made some progress in regard to the irritation surrounding at this time unfortunately despite that progress that's been made he still has a significant issue going on here. I'm not certain that he is having really any true infection at this time although with the Pyoderma gangrenosum it can Adrian, Raymel ([id number]) sometimes be difficult to differentiate infection versus just inflammation. For that reason I discussed with him today the possibility of perform a wound culture to ensure there's nothing overtly infected. 01/06/18 on evaluation today patient's wound is larger and deeper than previously evaluated. With that being said it did appear that his wound was infected after my last evaluation with him. Subsequently I did end  up prescribing a prescription for Bactrim DS which she has been taking and having no complication with. Fortunately there does not appear to be any evidence of infection at this point in time as far as anything spreading, no want to touch, and overall I feel like things are showing signs of improvement. 01/13/18 on evaluation today patient appears to be even a little larger and deeper than last time. There still muscle exposed in the base of the wound. Nonetheless he does appear to be less erythematous I do believe inflammation is calming down also believe the infection looks like it's probably resolved at this time based on what I'm seeing. No fevers, chills, nausea, or vomiting noted at this time. 01/30/18 on evaluation today patient actually appears to visually look better for the most part. Unfortunately those visually this looks better he does seem to potentially have what may be an abscess in the muscle that has been noted in the central portion of the wound. This is the first time that I have noted what appears to be fluctuance in the central portion of the muscle. With that being said I'm somewhat more concerned about the fact that this might indicate an abscess formation at this location. I do believe that an ultrasound would be appropriate. This is likely something we need to try to do as soon as possible. He has been switch to mupirocin ointment and he is no longer using the steroid ointment as prescribed by dermatology he sees them again next week he's been decreased from 60 to 40 mg of prednisone. 03/09/18 on evaluation today patient actually appears to be doing a little better compared to last time I saw him. There's not as much erythema surrounding the wound itself. He I did review his most recent infectious disease note which was dated 02/24/18. He saw Dr. Michel Bickers in  [place]. 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 Patient is now off of antibiotics and they are continually observing this. There seems to be no change in the past two weeks in my pinion based on what the patient says and what I see today compared to what Dr. Megan Salon likely saw two weeks ago. No fevers, chills, nausea, or vomiting noted at this time. 03/23/18 on evaluation today patient's wound actually appears to be showing signs of improvement which is good news. He is currently still on the Dapsone. He is also working on tapering the prednisone to get off of this and Dr. Lottie Rater is working with him in this regard. Nonetheless overall I feel like the wound is doing well it does appear based on the infectious disease note that I reviewed from Dr. 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 I did also review the results of his ultrasound of left lower extremity which revealed there was a dentist tissue in the base of the wound without an abscess noted. 04/06/18 on evaluation today the patient's left lateral lower extremity ulcer actually appears to be doing fairly well which is excellent news. There does not appear to be any evidence of infection at this time which is also great news. Overall he still does have a significantly large ulceration although little by little he seems to be making progress. He is down to 10 mg a day of the prednisone. 04/20/18 on evaluation today patient actually appears to be doing excellent at this time in regard to his left lower extremity ulcer. He's making signs of good progress unfortunately this is taking much longer than we would really like to see but nonetheless he is making progress. Fortunately there does not appear to be any evidence of infection at this time. No fevers, chills, nausea, or vomiting noted at this time. The patient has not been using the Santyl due to  the cost he hadn't got in this field yet. He's mainly been using the antibiotic ointment topically. Subsequently he also tells me that he really has not been scrubbing in the shower I think this would be helpful again as I told him it doesn't have to be anything too aggressive to even make it believe just enough to keep it free of some of the loose slough and biofilm on the wound surface. 05/11/18 on evaluation today patient's wound appears to be making slow but sure progress in regard to the left lateral lower extremity ulcer. He is been tolerating the dressing changes without complication. Fortunately there does not appear to be any evidence of infection at this time. He is still just using triple antibiotic ointment along with clobetasol occasionally over the area. He never got the Santyl and really does not seem to intend to in my pinion. 06/01/18 on evaluation today patient appears to be doing a little better in regard to his left lateral lower extremity ulcer. He states that overall he does not feel like he is doing as well with the Dapsone as he did with the prednisone. Nonetheless he sees his dermatologist later today and is gonna talk to them about the possibility of going back on the prednisone. Overall again I believe that the wound would be better if you would utilize [place] but he  really does not seem to be interested in going back to the Jud at this point. He has been using triple antibiotic ointment. DELVONTE, BERENSON ([id number]) 06/15/18 on evaluation today patient's wound actually appears to be doing about the same at this point. Fortunately there is no signs of infection at this time. He has made slight improvements although he continues to not really want to clean the wound bed at this point. He states that he just doesn't mess with it he doesn't want to cause any problems with everything else he has going on. He has been on medication, antibiotics as prescribed by his dermatologist,  for a staff infection of his lower extremities which is really drying out now and looking much better he tells me. Fortunately there is no sign of overall infection. 06/29/18 on evaluation today patient appears to be doing well in regard to his left lateral lower surety ulcer all things considering. Fortunately his staff infection seems to be greatly improved compared to previous. He has no signs of infection and this is drying up quite nicely. He is still the doxycycline for this is no longer on cental, Dapsone, or any of the other medications. His dermatologist has recommended possibility of an infusion but right now he does not want to proceed with that. 07/13/18 on evaluation today patient appears to be doing about the same in regard to his left lateral lower surety ulcer. Fortunately there's no signs of infection at this time which is great news. Unfortunately he still builds up a significant amount of Slough/biofilm of the surface of the wound he still is not really cleaning this as he should be appropriately. Again I'm able to easily with saline and gauze remove the majority of this on the surface which if you would do this at home would likely be a dramatic improvement for him as far as getting the area to improve. Nonetheless overall I still feel like he is making progress is just very slow. I think Santyl will be of benefit for him as well. Still he has not gotten this as of this point. 07/27/18 on evaluation today patient actually appears to be doing little worse in regards of the erythema around the periwound region of the wound he also tells me that he's been having more drainage currently compared to what he was experiencing last time I saw him. He states not quite as bad as what he had because this was infected previously but nonetheless is still appears to be doing poorly. Fortunately there is no evidence of systemic infection at this point. The patient tells me that he is not going to  be able to afford the Santyl. He is still waiting to hear about the infusion therapy with his dermatologist. Apparently she wants an updated colonoscopy first. 08/10/18 on evaluation today patient appears to be doing better in regard to his left lateral lower extremity ulcer. Fortunately he is showing signs of improvement in this regard he's actually been approved for Remicade infusion's as well although this has not been scheduled as of yet. Fortunately there's no signs of active infection at this time in regard to the wound although he is having some issues with infection of the right lower extremity is been seen as dermatologist for this. Fortunately they are definitely still working with him trying to keep things under control. 09/07/18 on evaluation today patient is actually doing rather well in regard to his left lateral lower extremity ulcer. He notes these actually having some hair  grow back on his extremity which is something he has not seen in years. He also tells me that the pain is really not giving them any trouble at this time which is also good news overall she is very pleased with the progress he's using a combination of the mupirocin along with the probate is all mixed. 09/21/18 on evaluation today patient actually appears to be doing fairly well all things considered in regard to his looks from the ulcer. He's been tolerating the dressing changes without complication. Fortunately there's no signs of active infection at this time which is good news he is still on all antibiotics or prevention of the staff infection. He has been on prednisone for time although he states it is gonna contact his dermatologist and see if she put them on a short course due to some irritation that he has going on currently. Fortunately there's no evidence of any overall worsening this is going very slow I think cental would be something that would be helpful for him although he states that $50 for tube is  quite expensive. He therefore is not willing to get that at this point. 10/06/18 on evaluation today patient actually appears to be doing decently well in regard to his left lateral leg ulcer. He's been tolerating the dressing changes without complication. Fortunately there's no signs of active infection at this time. Overall I'm actually rather pleased with the progress he's making although it's slow he doesn't show any signs of infection and he does seem to be making some improvement. I do believe that he may need a switch up and dressings to try to help this to heal more appropriately and quickly. 10/19/18 on evaluation today patient actually appears to be doing better in regard to his left lateral lower extremity ulcer. This is shown signs of having much less Slough buildup at this point due to the fact he has been using the [employer]. Obviously this is very good news. The overall size of the wound is not dramatically smaller but again the appearance is. 11/02/18 on evaluation today patient actually appears to be doing quite well in regard to his lower [place] ulcer. A lot of the skin around the ulcer is actually somewhat irritating at this point this seems to be more due to the dressing causing irritation from the adhesive that anything else. Fortunately there is no signs of active infection at this time. 11/24/18 on evaluation today patient appears to be doing a little worse in regard to his overall appearance of his lower extremity Paccione, Andrue ([id number]) ulcer. There's more erythema and warmth around the wound unfortunately. He is currently on doxycycline which he has been on for some time. With that being said I'm not sure that seems to be helping with what appears to possibly be an acute cellulitis with regard to his left lower extremity ulcer. No fevers, chills, nausea, or vomiting noted at this time. 12/08/18 on evaluation today patient's wounds actually appears to be doing significantly better  compared to his last evaluation. He has been using Santyl along with alternating tripling about appointment as well as the steroid cream seems to be doing quite well and the wound is showing signs of improvement which is excellent news. Fortunately there's no evidence of infection and in fact his culture came back negative with only normal skin flora noted. Patient History Information obtained from Patient. Family History Diabetes - Mother,Maternal Grandparents, Heart Disease - Mother,Maternal Grandparents, Hereditary Spherocytosis - Siblings, Hypertension - Maternal Grandparents,  Stroke - Siblings, No family history of Cancer, Kidney Disease, Lung Disease, Seizures, Thyroid Problems, Tuberculosis. Social History Current some day smoker, Marital Status - Married, Alcohol Use - Rarely, Drug Use - No History, Caffeine Use - Moderate. Medical History Eyes Denies history of Cataracts, Glaucoma, Optic Neuritis Ear/Nose/Mouth/Throat Denies history of Chronic sinus problems/congestion, Middle ear problems Hematologic/Lymphatic Denies history of Anemia, Hemophilia, Human Immunodeficiency Virus, Lymphedema, Sickle Cell Disease Respiratory Patient has history of Sleep Apnea - cpap Denies history of Aspiration, Asthma, Chronic Obstructive Pulmonary Disease (COPD), Pneumothorax Cardiovascular Patient has history of Hypertension Denies history of Angina, Arrhythmia, Congestive Heart Failure, Coronary Artery Disease, Deep Vein Thrombosis, Hypotension, Myocardial Infarction, Peripheral Arterial Disease, Peripheral Venous Disease, Phlebitis, Vasculitis Gastrointestinal Patient has history of Colitis Endocrine Denies history of Type I Diabetes, Type II Diabetes Genitourinary Denies history of End Stage Renal Disease Immunological Denies history of Lupus Erythematosus, Raynaud s, Scleroderma Integumentary (Skin) Denies history of History of Burn, History of pressure wounds Musculoskeletal Denies  history of Gout, Rheumatoid Arthritis, Osteoarthritis, Osteomyelitis Neurologic Denies history of Dementia, Neuropathy, Quadriplegia, Paraplegia, Seizure Disorder Oncologic Denies history of Received Chemotherapy, Received Radiation Psychiatric Denies history of Anorexia/bulimia, Confinement Anxiety Review of Systems (ROS) Constitutional Symptoms (General Health) Denies complaints or symptoms of Fatigue, Fever, Chills, Marked Weight Change. Respiratory Denies complaints or symptoms of Chronic or frequent coughs, Shortness of Breath. Cardiovascular Guymon, Mckinnley ([id number]) Denies complaints or symptoms of Chest pain, LE edema. Psychiatric Denies complaints or symptoms of Anxiety, Claustrophobia. Objective Constitutional Well-nourished and well-hydrated in no acute distress. Vitals Time Taken: 8:16 AM, Height: 71 in, Weight: 338 lbs, BMI: 47.1, Temperature: 98.4 F, Pulse: 73 bpm, Respiratory Rate: 16 breaths/min, Blood Pressure: 137/78 mmHg. Respiratory normal breathing without difficulty. clear to auscultation bilaterally. Cardiovascular regular rate and rhythm with normal S1, S2. Psychiatric this patient is able to make decisions and demonstrates good insight into disease process. Alert and Oriented x 3. pleasant and cooperative. General Notes: On inspection today patient's wound bed actually showed signs of improvement with some good epithelialization around the edge and the slough was not nearly as thick and adherent today. Overall I feel like he's doing well in this regard. Integumentary (Hair, Skin) Wound #1 status is Open. Original cause of wound was Gradually Appeared. The wound is located on the Left,Lateral Lower Leg. The wound measures 2.7cm length x 1.7cm width x 0.2cm depth; 3.605cm^2 area and 0.721cm^3 volume. There is Fat Layer (Subcutaneous Tissue) Exposed exposed. There is no tunneling or undermining noted. There is a large amount of serous drainage noted. The  wound margin is distinct with the outline attached to the wound base. There is small (1-33%) red granulation within the wound bed. There is a large (67-100%) amount of necrotic tissue within the wound bed including Adherent Slough. 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 There was a Chemical/Enzymatic/Mechanical debridement performed by Montey Hora, RN. With the following instrument(s): tongue blade after achieving pain control using Lidocaine 4% Topical Solution. Agent used was [employer]. A time out was conducted at 08:51, prior to the start of the procedure. There was no bleeding. The procedure was tolerated well with a pain level of 0 throughout and a pain level of 0 following the procedure. Post Debridement Measurements: 2.7cm length x 1.7cm width x 0.2cm depth; 0.721cm^3 volume.  Character of Wound/Ulcer Post Debridement requires further debridement. Post procedure Diagnosis Wound #1: Same as Pre-Procedure Plan Wound Cleansing: Wound #1 Left,Lateral Lower Leg: Clean wound with Normal Saline. Anesthetic (add to Medication List): Wound #1 Left,Lateral Lower Leg: Topical Lidocaine 4% cream applied to wound bed prior to debridement (In Clinic Only). Primary Wound Dressing: Wound #1 Left,Lateral Lower Leg: Santyl Ointment Secondary Dressing: Wound #1 Left,Lateral Lower Leg: Boardered Foam Dressing Dressing Change Frequency: Wound #1 Left,Lateral Lower Leg: Change dressing every day. Follow-up Appointments: Wound #1 Left,Lateral Lower Leg: Return Appointment in 2 weeks. Edema Control: Wound #1 Left,Lateral Lower Leg: Elevate legs to the level of the heart and pump ankles as often as possible Medications-please add to  medication list.: Wound #1 Left,Lateral Lower Leg: Santyl Enzymatic Ointment My suggestion is gonna be that we go ahead and continue with the above wound care measures for the next week and the patient is in agreement with that plan. We will subsequently see were things stand at follow-up. If anything changes or worsens of contact the office and let me know. Please see above for specific wound care orders. We will see patient for re-evaluation in 1 week(s) here in the clinic. If anything worsens or changes patient will contact our office for additional recommendations. KAMARI, [name] ([id number]) Electronic Signature(s) Signed: 12/08/2018 6:14:16 PM By: Worthy Keeler PA-C Entered By: Worthy Keeler on 12/08/2018 09:20:18 RICHARDO, POPOFF ([id number]) -------------------------------------------------------------------------------- ROS/PFSH Details Patient Name: [name] Date of Service: 12/08/2018 8:15 AM Medical Record Number: [id number] Patient Account Number: [id number] Date of Birth/Sex: 07/31/1980 (37 y.o. M) Treating RN: Montey Hora Primary Care Provider: [name] Other Clinician: Referring Provider: [name] Treating Provider/Extender: Melburn Hake, Karen Huhta Weeks in Treatment: 104 Information Obtained From Patient Constitutional Symptoms (General Health) Complaints and Symptoms: Negative for: Fatigue; Fever; Chills; Marked Weight Change Respiratory Complaints and Symptoms: Negative for: Chronic or frequent coughs; Shortness of Breath Medical History: Positive for: Sleep Apnea - cpap Negative for: Aspiration; Asthma; Chronic Obstructive Pulmonary Disease (COPD); Pneumothorax Cardiovascular Complaints and Symptoms: Negative for: Chest pain; LE edema Medical History: Positive for: Hypertension Negative for: Angina; Arrhythmia; Congestive Heart Failure; Coronary Artery Disease; Deep Vein Thrombosis; Hypotension; Myocardial Infarction; Peripheral Arterial Disease;  Peripheral Venous Disease; Phlebitis; Vasculitis Psychiatric Complaints and Symptoms: Negative for: Anxiety; Claustrophobia Medical History: Negative for: Anorexia/bulimia; Confinement Anxiety Eyes Medical History: Negative for: Cataracts; Glaucoma; Optic Neuritis Ear/Nose/Mouth/Throat Medical History: Negative for: Chronic sinus problems/congestion; Middle ear problems Hematologic/Lymphatic Medical History: Negative for: Anemia; Hemophilia; Human Immunodeficiency Virus; Lymphedema; Sickle Cell Disease Space, Graylin ([id number]) Gastrointestinal Medical History: Positive for: Colitis Endocrine Medical History: Negative for: Type I Diabetes; Type II Diabetes Genitourinary Medical History: Negative for: End Stage Renal Disease Immunological Medical History: Negative for: Lupus Erythematosus; Raynaudos; Scleroderma Integumentary (Skin) Medical History: Negative for: History of Burn; History of pressure wounds Musculoskeletal Medical History: Negative for: Gout; Rheumatoid Arthritis; Osteoarthritis; Osteomyelitis Neurologic Medical History: Negative for: Dementia; Neuropathy; Quadriplegia; Paraplegia; Seizure Disorder Oncologic Medical History: Negative for: Received Chemotherapy; Received Radiation Immunizations Pneumococcal Vaccine: Received Pneumococcal Vaccination: No Implantable Devices No devices added Family and Social History Cancer: No; Diabetes: Yes - Mother,Maternal Grandparents; Heart Disease: Yes - Mother,Maternal Grandparents; Hereditary Spherocytosis: Yes - Siblings; Hypertension: Yes - Maternal Grandparents; Kidney Disease: No; Lung Disease: No; Seizures: No; Stroke: Yes - Siblings; Thyroid Problems: No; Tuberculosis: No; Current some day smoker; Marital Status - Married; Alcohol Use: Rarely; Drug Use: No History; Caffeine Use: Moderate; Financial Concerns: No; Food, Clothing or Shelter Needs: No; Support  System Lacking: No; Transportation Concerns:  No Physician Affirmation I have reviewed and agree with the above information. Electronic Signature(s) Signed: 12/08/2018 4:59:24 PM By: Hal Hope ([id number]) Signed: 12/08/2018 6:14:16 PM By: Worthy Keeler PA-C Entered By: Worthy Keeler on 12/08/2018 09:19:49 CARZELL, SALDIVAR ([id number]) -------------------------------------------------------------------------------- SuperBill Details Patient Name: [name] Date of Service: 12/08/2018 Medical Record Number: [id number] Patient Account Number: [id number] Date of Birth/Sex: [birth year] (37 y.o. 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

## 2018-12-09 NOTE — Progress Notes (Signed)
TURNER, BAILLIE ([id number]) Visit Report for 12/08/2018 Arrival Information Details Patient Name: [name], [name] Date of Service: 12/08/2018 8:15 AM Medical Record Number: [id number] Patient Account Number: [id number] Date of Birth/Sex: 1981/04/24 (38 y.o. M) Treating RN: Montey Hora Primary Care Jash Wahlen: [name] Other Clinician: Referring Novie Maggio: [name] Treating Geralda Baumgardner/Extender: Melburn Hake, [name]: 104 Visit Information History Since Last Visit Added or deleted any medications: No Patient Arrived: Ambulatory Any new allergies or adverse reactions: No Arrival Time: 08:15 Had a fall or experienced change in No Accompanied By: self activities of daily living that may affect Transfer Assistance: None risk of falls: Patient Identification Verified: Yes Signs or symptoms of abuse/neglect since last visito No Secondary Verification Process Completed: Yes Hospitalized since last visit: No Patient Requires Transmission-Based No Implantable device outside of the clinic excluding No Precautions: cellular tissue based products placed in the center Patient Has Alerts: No since last visit: Has Dressing in Place as Prescribed: Yes Pain Present Now: No Electronic Signature(s) Signed: 12/08/2018 11:14:01 AM By: Lorine Bears RCP, RRT, CHT Entered By: Lorine Bears on 12/08/2018 08:16:03 [name] ([id number]) -------------------------------------------------------------------------------- Encounter Discharge Information Details Patient Name: [name] Date of Service: 12/08/2018 8:15 AM Medical Record Number: [id number] Patient Account Number: [id number] Date of Birth/Sex: [birth year] (38 y.o. M) Treating RN: Montey Hora Primary Care Aymee Fomby: [name] Other Clinician: Referring Lanai Conlee: [name] Treating Patrisia Faeth/Extender: Melburn Hake, [name]: 104 Encounter Discharge Information  Items Post Procedure Vitals Discharge Condition: Stable Temperature (F): 98.4 Ambulatory Status: Ambulatory Pulse (bpm): 73 Discharge Destination: Home Respiratory Rate (breaths/min): 16 Transportation: Private Auto Blood Pressure (mmHg): 137/78 Accompanied By: self Schedule Follow-up Appointment: Yes Clinical Summary of Care: Electronic Signature(s) Signed: 12/08/2018 4:59:24 PM By: Montey Hora Entered By: Montey Hora on 12/08/2018 08:53:48 [name] ([id number]) -------------------------------------------------------------------------------- Lower Extremity Assessment Details Patient Name: [name] Date of Service: 12/08/2018 8:15 AM Medical Record Number: [id number] Patient Account Number: [id number] Date of Birth/Sex: 11-04-1980 (38 y.o. M) Treating RN: [name] Primary Care Lawsen Arnott: [name] Other Clinician: Referring Aevah Stansbery: [name] Treating Martita Brumm/Extender: Sharalyn Ink in Treatment: 104 Vascular Assessment Pulses: Dorsalis Pedis Palpable: [Left:Yes] Electronic Signature(s) Signed: 12/08/2018 9:33:28 AM By: [name], [name], RN, [name], [name], [name] Entered By: [name], [name], RN, [name], [name] on 12/08/2018 08:23:54 [name] ([id number]) -------------------------------------------------------------------------------- Multi Wound Chart Details Patient Name: [name] Date of Service: 12/08/2018 8:15 AM Medical Record Number: [id number] Patient Account Number: [id number] Date of Birth/Sex: [birth year] (38 y.o. M) Treating RN: Montey Hora Primary Care Nnaemeka Samson: [name] Other Clinician: Referring Niani Mourer: [name] Treating Florence Antonelli/Extender: Melburn Hake, [name]: 104 Vital Signs Height(in): 71 Pulse(bpm): 73 Weight(lbs): 338 Blood Pressure(mmHg): 137/78 Body Mass Index(BMI): 47 Temperature(F): 98.4 Respiratory Rate 16 (breaths/min): Photos: [N/A:N/A] Wound Location: Left Lower Leg - Lateral  N/A N/A Wounding Event: Gradually Appeared N/A N/A Primary Etiology: Pyoderma N/A N/A Comorbid History: Sleep Apnea, Hypertension, N/A N/A Colitis Date Acquired: 11/18/2015 N/A N/A Weeks of Treatment: 104 N/A N/A Wound Status: Open N/A N/A Measurements L x W x D 2.7x1.7x0.2 N/A N/A (cm) Area (cm) : 3.605 N/A N/A Volume (cm) : 0.721 N/A N/A % Reduction in Area: 26.60% N/A N/A % Reduction in Volume: 81.60% N/A N/A Classification: Full Thickness With Exposed N/A N/A Support Structures Exudate Amount: Large N/A N/A Exudate Type: Serous N/A N/A Exudate Color: amber N/A N/A Wound Margin: Distinct, outline attached N/A N/A Granulation Amount: Small (1-33%) N/A N/A Granulation Quality: Red N/A N/A  Necrotic Amount: Large (67-100%) N/A N/A Exposed Structures: Fat Layer (Subcutaneous N/A N/A Tissue) Exposed: Yes Fascia: No Tendon: No Muscle: No FELL, Sebastion ([id number]) Joint: No Bone: No Epithelialization: None N/A N/A Treatment Notes Electronic Signature(s) Signed: 12/08/2018 4:59:24 PM By: Montey Hora Entered By: Montey Hora on 12/08/2018 08:45:25 [name] ([id number]) -------------------------------------------------------------------------------- Multi-Disciplinary Care Plan Details Patient Name: [name] Date of Service: 12/08/2018 8:15 AM Medical Record Number: [id number] Patient Account Number: [id number] Date of Birth/Sex: 27-May-1980 (38 y.o. M) Treating RN: Montey Hora Primary Care Kahlyn Shippey: [name] Other Clinician: Referring Lasha Echeverria: [name] Treating Findley Blankenbaker/Extender: Melburn Hake, [name]: 24 Active Inactive Orientation to the Wound Care Program Nursing Diagnoses: Knowledge deficit related to the wound healing center program Goals: Patient/caregiver will verbalize understanding of the [place] Program Date Initiated: 12/11/2016 Target Resolution Date: 03/13/2017 Goal Status:  Active Interventions: Provide education on orientation to the wound center Notes: Venous Leg Ulcer Nursing Diagnoses: Knowledge deficit related to disease process and management Potential for venous Insuffiency (use before diagnosis confirmed) Goals: Non-invasive venous studies are completed as ordered Date Initiated: 12/11/2016 Target Resolution Date: 03/13/2017 Goal Status: Active Patient will maintain optimal edema control Date Initiated: 12/11/2016 Target Resolution Date: 03/13/2017 Goal Status: Active Patient/caregiver will verbalize understanding of disease process and disease management Date Initiated: 12/11/2016 Target Resolution Date: 03/13/2017 Goal Status: Active Verify adequate tissue perfusion prior to therapeutic compression application Date Initiated: 12/11/2016 Target Resolution Date: 03/13/2017 Goal Status: Active Interventions: Assess peripheral edema status every visit. Compression as ordered Provide education on venous insufficiency Treatment Activities: JOREN, REHM ([id number]) Therapeutic compression applied : 12/11/2016 Notes: Wound/Skin Impairment Nursing Diagnoses: Impaired tissue integrity Knowledge deficit related to ulceration/compromised skin integrity Goals: Patient/caregiver will verbalize understanding of skin care regimen Date Initiated: 12/11/2016 Target Resolution Date: 03/13/2017 Goal Status: Active Ulcer/skin breakdown will have a volume reduction of 30% by week 4 Date Initiated: 12/11/2016 Target Resolution Date: 03/13/2017 Goal Status: Active Ulcer/skin breakdown will have a volume reduction of 50% by week 8 Date Initiated: 12/11/2016 Target Resolution Date: 03/13/2017 Goal Status: Active Ulcer/skin breakdown will have a volume reduction of 80% by week 12 Date Initiated: 12/11/2016 Target Resolution Date: 03/13/2017 Goal Status: Active Ulcer/skin breakdown will heal within 14 weeks Date Initiated: 12/11/2016 Target Resolution Date:  03/13/2017 Goal Status: Active Interventions: Assess patient/caregiver ability to obtain necessary supplies Assess patient/caregiver ability to perform ulcer/skin care regimen upon admission and as needed Assess ulceration(s) every visit Provide education on ulcer and skin care Treatment Activities: Skin care regimen initiated : 12/11/2016 Topical wound management initiated : 12/11/2016 Notes: Electronic Signature(s) Signed: 12/08/2018 4:59:24 PM By: Montey Hora Entered By: Montey Hora on 12/08/2018 08:44:44 Vallely, [name] ([id number]) -------------------------------------------------------------------------------- Pain Assessment Details Patient Name: [name] Date of Service: 12/08/2018 8:15 AM Medical Record Number: [id number] Patient Account Number: [id number] Date of Birth/Sex: [birth year] (37 y.o. M) Treating RN: Montey Hora Primary Care Tyreka Henneke: [name] Other Clinician: Referring Mariene Dickerman: [name] Treating Keldrick Pomplun/Extender: Melburn Hake, [name]: 104 Active Problems Location of Pain Severity and Description of Pain Patient Has Paino No Site Locations Pain Management and Medication Current Pain Management: Electronic Signature(s) Signed: 12/08/2018 11:14:01 AM By: Paulla Fore, RRT, CHT Signed: 12/08/2018 4:59:24 PM By: Montey Hora Entered By: Lorine Bears on 12/08/2018 08:16:11 [name] ([id number]) -------------------------------------------------------------------------------- Patient/Caregiver Education Details Patient Name: [name] Date of Service: 12/08/2018 8:15 AM Medical Record Number: [id number] Patient Account Number: [id number] Date of Birth/Gender: Jun 23, 1980 (37 y.o. M) Treating RN:  Montey Hora Primary Care Physician: [name] Other Clinician: Referring Physician: [name] Treating Physician/Extender: Sharalyn Ink in Treatment: 104 Education  Assessment Education Provided To: Patient Education Topics Provided Wound/Skin Impairment: Handouts: Other: wound care as ordered Methods: Demonstration, Explain/Verbal Responses: State content correctly Electronic Signature(s) Signed: 12/08/2018 4:59:24 PM By: Montey Hora Entered By: Montey Hora on 12/08/2018 08:52:50 [name] ([id number]) -------------------------------------------------------------------------------- Wound Assessment Details Patient Name: [name] Date of Service: 12/08/2018 8:15 AM Medical Record Number: [id number] Patient Account Number: [id number] Date of Birth/Sex: [birth year] (37 y.o. M) Treating RN: [name] Primary Care Samiksha Pellicano: [name] Other Clinician: Referring Javi Bollman: [name] Treating Harun Brumley/Extender: Melburn Hake, [name]: 104 Wound Status Wound Number: 1 Primary Etiology: Pyoderma Wound Location: Left Lower Leg - Lateral Wound Status: Open Wounding Event: Gradually Appeared Comorbid History: Sleep Apnea, Hypertension, Colitis Date Acquired: 11/18/2015 Weeks Of Treatment: 104 Clustered Wound: No Photos Wound Measurements Length: (cm) 2.7 % Reduction Width: (cm) 1.7 % Reduction Depth: (cm) 0.2 Epitheliali Area: (cm) 3.605 Tunneling: Volume: (cm) 0.721 Underminin in Area: 26.6% in Volume: 81.6% zation: None No g: No Wound Description Full Thickness With Exposed Support Foul Odor A Classification: Structures Slough/Fibr Wound Margin: Distinct, outline attached Exudate Large Amount: Exudate Type: Serous Exudate Color: amber fter Cleansing: No ino Yes Wound Bed Granulation Amount: Small (1-33%) Exposed Structure Granulation Quality: Red Fascia Exposed: No Necrotic Amount: Large (67-100%) Fat Layer (Subcutaneous Tissue) Exposed: Yes Necrotic Quality: Adherent Slough Tendon Exposed: No Muscle Exposed: No Joint Exposed: No Bone Exposed: No [place], Angelgabriel ([id number]) Treatment  Notes Wound #1 (Left, Lateral Lower Leg) Notes santyl, BFD Electronic Signature(s) Signed: 12/08/2018 9:33:28 AM By: [name], [name], RN, [name], [name], [name] Entered By: [name], [name], RN, [name], [name] on 12/08/2018 08:23:31 [name] ([id number]) -------------------------------------------------------------------------------- Vitals Details Patient Name: [name] Date of Service: 12/08/2018 8:15 AM Medical Record Number: [id number] Patient Account Number: [id number] Date of Birth/Sex: 07/06/1980 (37 y.o. M) Treating RN: Montey Hora Primary Care Eren Puebla: [name] Other Clinician: Referring Elveta Rape: [name] Treating Zaakirah Kistner/Extender: Melburn Hake, [name]: 104 Vital Signs Time Taken: 08:16 Temperature (F): 98.4 Height (in): 71 Pulse (bpm): 73 Weight (lbs): 338 Respiratory Rate (breaths/min): 16 Body Mass Index (BMI): 47.1 Blood Pressure (mmHg): 137/78 Reference Range: 80 - 120 mg / dl Electronic Signature(s) Signed: 12/08/2018 11:14:01 AM By: Lorine Bears RCP, RRT, CHT Entered By: Lorine Bears on 12/08/2018 08:18:38

## 2018-12-15 ENCOUNTER — Ambulatory Visit: Payer: 59

## 2018-12-15 ENCOUNTER — Other Ambulatory Visit: Payer: Self-pay

## 2018-12-15 DIAGNOSIS — G4733 Obstructive sleep apnea (adult) (pediatric): Secondary | ICD-10-CM | POA: Diagnosis not present

## 2018-12-15 DIAGNOSIS — G4719 Other hypersomnia: Secondary | ICD-10-CM

## 2018-12-17 DIAGNOSIS — G4733 Obstructive sleep apnea (adult) (pediatric): Secondary | ICD-10-CM | POA: Diagnosis not present

## 2018-12-18 ENCOUNTER — Telehealth: Payer: Self-pay | Admitting: Internal Medicine

## 2018-12-18 DIAGNOSIS — G4733 Obstructive sleep apnea (adult) (pediatric): Secondary | ICD-10-CM

## 2018-12-18 NOTE — Telephone Encounter (Signed)
HST confirmed very severe OSA with AHI of 55, which was worsen in supine position with AHI of 79. Recommend auto cpap 10-20cm h2O and avoid sleeping in supine position.  Left message to relay results.

## 2018-12-21 ENCOUNTER — Other Ambulatory Visit: Payer: Self-pay

## 2018-12-21 ENCOUNTER — Encounter: Payer: 59 | Attending: Physician Assistant | Admitting: Physician Assistant

## 2018-12-21 DIAGNOSIS — L97222 Non-pressure chronic ulcer of left calf with fat layer exposed: Secondary | ICD-10-CM | POA: Diagnosis present

## 2018-12-21 DIAGNOSIS — I872 Venous insufficiency (chronic) (peripheral): Secondary | ICD-10-CM | POA: Diagnosis not present

## 2018-12-21 DIAGNOSIS — F17200 Nicotine dependence, unspecified, uncomplicated: Secondary | ICD-10-CM | POA: Diagnosis not present

## 2018-12-21 DIAGNOSIS — I10 Essential (primary) hypertension: Secondary | ICD-10-CM | POA: Insufficient documentation

## 2018-12-21 DIAGNOSIS — G4730 Sleep apnea, unspecified: Secondary | ICD-10-CM | POA: Insufficient documentation

## 2018-12-21 DIAGNOSIS — E669 Obesity, unspecified: Secondary | ICD-10-CM | POA: Insufficient documentation

## 2018-12-21 DIAGNOSIS — Z8249 Family history of ischemic heart disease and other diseases of the circulatory system: Secondary | ICD-10-CM | POA: Insufficient documentation

## 2018-12-21 DIAGNOSIS — K5190 Ulcerative colitis, unspecified, without complications: Secondary | ICD-10-CM | POA: Insufficient documentation

## 2018-12-21 DIAGNOSIS — Z823 Family history of stroke: Secondary | ICD-10-CM | POA: Diagnosis not present

## 2018-12-21 NOTE — Progress Notes (Addendum)
STONE, SPIRITO ([id number]) Visit Report for 12/21/2018 Arrival Information Details Patient Name: [name], [name] Date of Service: 12/21/2018 8:15 AM Medical Record Number: [id number] Patient Account Number: [id number] Date of Birth/Sex: [birth year] (38 y.o. M) Treating RN: Montey Hora Primary Care Alphus Zeck: [name] Other Clinician: Referring Mysty Kielty: [name] Treating Abdulwahab Demelo/Extender: Melburn Hake, [name]: 106 Visit Information History Since Last Visit Added or deleted any medications: No Patient Arrived: Ambulatory Any new allergies or adverse reactions: No Arrival Time: 08:26 Had a fall or experienced change in No Accompanied By: self activities of daily living that may affect Transfer Assistance: None risk of falls: Patient Identification Verified: Yes Signs or symptoms of abuse/neglect since last visito No Secondary Verification Process Completed: Yes Hospitalized since last visit: No Patient Requires Transmission-Based No Implantable device outside of the clinic excluding No Precautions: cellular tissue based products placed in the center Patient Has Alerts: No since last visit: Has Dressing in Place as Prescribed: Yes Has Compression in Place as Prescribed: Yes Pain Present Now: No Electronic Signature(s) Signed: 12/21/2018 3:29:41 PM By: Montey Hora Entered By: Montey Hora on 12/21/2018 08:26:52 [name] ([id number]) -------------------------------------------------------------------------------- Clinic Level of Care Assessment Details Patient Name: [name] Date of Service: 12/21/2018 8:15 AM Medical Record Number: [id number] Patient Account Number: [id number] Date of Birth/Sex: 1980-12-19 (37 y.o. M) Treating RN: Harold Barban Primary Care Artis Buechele: [name] Other Clinician: Referring Cosmo Tetreault: [name] Treating Ngai Parcell/Extender: Melburn Hake, [name]: 106 Clinic Level of Care Assessment  Items TOOL 4 Quantity Score []  - Use when only an EandM is performed on FOLLOW-UP visit 0 ASSESSMENTS - Nursing Assessment / Reassessment X - Reassessment of Co-morbidities (includes updates in patient status) 1 10 X- 1 5 Reassessment of Adherence to Treatment Plan ASSESSMENTS - Wound and Skin Assessment / Reassessment X - Simple Wound Assessment / Reassessment - one wound 1 5 []  - 0 Complex Wound Assessment / Reassessment - multiple wounds []  - 0 Dermatologic / Skin Assessment (not related to wound area) ASSESSMENTS - Focused Assessment []  - Circumferential Edema Measurements - multi extremities 0 []  - 0 Nutritional Assessment / Counseling / Intervention []  - 0 Lower Extremity Assessment (monofilament, tuning fork, pulses) []  - 0 Peripheral Arterial Disease Assessment (using hand held doppler) ASSESSMENTS - Ostomy and/or Continence Assessment and Care []  - Incontinence Assessment and Management 0 []  - 0 Ostomy Care Assessment and Management (repouching, etc.) PROCESS - Coordination of Care X - Simple Patient / Family Education for ongoing care 1 15 []  - 0 Complex (extensive) Patient / Family Education for ongoing care []  - 0 Staff obtains Programmer, systems, Records, Test Results / Process Orders []  - 0 Staff telephones HHA, Nursing Homes / Clarify orders / etc []  - 0 Routine Transfer to another Facility (non-emergent condition) []  - 0 Routine Hospital Admission (non-emergent condition) []  - 0 New Admissions / Biomedical engineer / Ordering NPWT, Apligraf, etc. []  - 0 Emergency Hospital Admission (emergent condition) X- 1 10 Simple Discharge Coordination Bojarski, Man ([id number]) []  - 0 Complex (extensive) Discharge Coordination PROCESS - Special Needs []  - Pediatric / Minor Patient Management 0 []  - 0 Isolation Patient Management []  - 0 Hearing / Language / Visual special needs []  - 0 Assessment of Community assistance (transportation, D/C planning, etc.) []  -  0 Additional assistance / Altered mentation []  - 0 Support Surface(s) Assessment (bed, cushion, seat, etc.) INTERVENTIONS - Wound Cleansing / Measurement X - Simple Wound Cleansing - one wound 1 5 []  - 0  Complex Wound Cleansing - multiple wounds X- 1 5 Wound Imaging (photographs - any number of wounds) []  - 0 Wound Tracing (instead of photographs) X- 1 5 Simple Wound Measurement - one wound []  - 0 Complex Wound Measurement - multiple wounds INTERVENTIONS - Wound Dressings X - Small Wound Dressing one or multiple wounds 1 10 []  - 0 Medium Wound Dressing one or multiple wounds []  - 0 Large Wound Dressing one or multiple wounds X- 1 5 Application of Medications - topical []  - 0 Application of Medications - injection INTERVENTIONS - Miscellaneous []  - External ear exam 0 []  - 0 Specimen Collection (cultures, biopsies, blood, body fluids, etc.) []  - 0 Specimen(s) / Culture(s) sent or taken to Lab for analysis []  - 0 Patient Transfer (multiple staff / Civil Service fast streamer / Similar devices) []  - 0 Simple Staple / Suture removal (25 or less) []  - 0 Complex Staple / Suture removal (26 or more) []  - 0 Hypo / Hyperglycemic Management (close monitor of Blood Glucose) []  - 0 Ankle / Brachial Index (ABI) - do not check if billed separately X- 1 5 Vital Signs Silvey, Jerman ([id number]) Has the patient been seen at the hospital within the last three years: Yes Total Score: 80 Level Of Care: New/Established - Level 3 Electronic Signature(s) Signed: 12/21/2018 4:11:30 PM By: Harold Barban Entered By: Harold Barban on 12/21/2018 08:40:45 [name] ([id number]) -------------------------------------------------------------------------------- Encounter Discharge Information Details Patient Name: [name] Date of Service: 12/21/2018 8:15 AM Medical Record Number: [id number] Patient Account Number: [id number] Date of Birth/Sex: [birth year] (37 y.o. M) Treating RN: Army Melia Primary  Care Nain Rudd: [name] Other Clinician: Referring Zelene Barga: [name] Treating Alvie Fowles/Extender: Melburn Hake, [name]: 106 Encounter Discharge Information Items Discharge Condition: Stable Ambulatory Status: Ambulatory Discharge Destination: Home Transportation: Private Auto Accompanied By: self Schedule Follow-up Appointment: Yes Clinical Summary of Care: Electronic Signature(s) Signed: 12/21/2018 1:41:30 PM By: Army Melia Entered By: Army Melia on 12/21/2018 08:51:53 [name] ([id number]) -------------------------------------------------------------------------------- Lower Extremity Assessment Details Patient Name: [name] Date of Service: 12/21/2018 8:15 AM Medical Record Number: [id number] Patient Account Number: [id number] Date of Birth/Sex: Dec 06, 1980 (37 y.o. M) Treating RN: Montey Hora Primary Care Ajla Mcgeachy: [name] Other Clinician: Referring Solenne Manwarren: [name] Treating Mikisha Roseland/Extender: STONE III, [name]: 106 Edema Assessment Assessed: [Left: No] [Right: No] Edema: [Left: Ye] [Right: s] Vascular Assessment Pulses: Dorsalis Pedis Palpable: [Left:Yes] Electronic Signature(s) Signed: 12/21/2018 3:29:41 PM By: Montey Hora Entered By: Montey Hora on 12/21/2018 08:34:00 [name] ([id number]) -------------------------------------------------------------------------------- Multi Wound Chart Details Patient Name: [name] Date of Service: 12/21/2018 8:15 AM Medical Record Number: [id number] Patient Account Number: [id number] Date of Birth/Sex: March 18, 1981 (37 y.o. M) Treating RN: Harold Barban Primary Care Seng Fouts: [name] Other Clinician: Referring Jill Stopka: [name] Treating Nikitia Asbill/Extender: Melburn Hake, [name]: 106 Vital Signs Height(in): 71 Pulse(bpm): 77 Weight(lbs): 338 Blood Pressure(mmHg): 137/85 Body Mass Index(BMI):  47 Temperature(F): 98.2 Respiratory Rate 18 (breaths/min): Photos: [N/A:N/A] Wound Location: Left Lower Leg - Lateral N/A N/A Wounding Event: Gradually Appeared N/A N/A Primary Etiology: Pyoderma N/A N/A Comorbid History: Sleep Apnea, Hypertension, N/A N/A Colitis Date Acquired: 11/18/2015 N/A N/A Weeks of Treatment: 106 N/A N/A Wound Status: Open N/A N/A Measurements L x W x D 2.9x1.6x0.2 N/A N/A (cm) Area (cm) : 3.644 N/A N/A Volume (cm) : 0.729 N/A N/A % Reduction in Area: 25.80% N/A N/A % Reduction in Volume: 81.40% N/A N/A Classification: Full Thickness With Exposed N/A N/A Support Structures  Exudate Amount: Large N/A N/A Exudate Type: Serous N/A N/A Exudate Color: amber N/A N/A Wound Margin: Distinct, outline attached N/A N/A Granulation Amount: Medium (34-66%) N/A N/A Granulation Quality: Red N/A N/A Necrotic Amount: Medium (34-66%) N/A N/A Exposed Structures: Fat Layer (Subcutaneous N/A N/A Tissue) Exposed: Yes Fascia: No Tendon: No Muscle: No Mcbrayer, Fischer ([id number]) Joint: No Bone: No Epithelialization: None N/A N/A Treatment Notes Electronic Signature(s) Signed: 12/21/2018 4:11:30 PM By: Harold Barban Entered By: Harold Barban on 12/21/2018 08:39:26 [name] ([id number]) -------------------------------------------------------------------------------- Multi-Disciplinary Care Plan Details Patient Name: [name] Date of Service: 12/21/2018 8:15 AM Medical Record Number: [id number] Patient Account Number: [id number] Date of Birth/Sex: 01-13-1981 (37 y.o. M) Treating RN: Harold Barban Primary Care Kamron Vanwyhe: [name] Other Clinician: Referring Antwon Rochin: [name] Treating Revere Maahs/Extender: Melburn Hake, [name]: 106 Active Inactive Orientation to the Wound Care Program Nursing Diagnoses: Knowledge deficit related to the wound healing center program Goals: Patient/caregiver will verbalize understanding of the  [facility] Program Date Initiated: 12/11/2016 Target Resolution Date: 03/13/2017 Goal Status: Active Interventions: Provide education on orientation to the wound center Notes: Venous Leg Ulcer Nursing Diagnoses: Knowledge deficit related to disease process and management Potential for venous Insuffiency (use before diagnosis confirmed) Goals: Non-invasive venous studies are completed as ordered Date Initiated: 12/11/2016 Target Resolution Date: 03/13/2017 Goal Status: Active Patient will maintain optimal edema control Date Initiated: 12/11/2016 Target Resolution Date: 03/13/2017 Goal Status: Active Patient/caregiver will verbalize understanding of disease process and disease management Date Initiated: 12/11/2016 Target Resolution Date: 03/13/2017 Goal Status: Active Verify adequate tissue perfusion prior to therapeutic compression application Date Initiated: 12/11/2016 Target Resolution Date: 03/13/2017 Goal Status: Active Interventions: Assess peripheral edema status every visit. Compression as ordered Provide education on venous insufficiency Treatment Activities: SHON, INDELICATO ([id number]) Therapeutic compression applied : 12/11/2016 Notes: Wound/Skin Impairment Nursing Diagnoses: Impaired tissue integrity Knowledge deficit related to ulceration/compromised skin integrity Goals: Patient/caregiver will verbalize understanding of skin care regimen Date Initiated: 12/11/2016 Target Resolution Date: 03/13/2017 Goal Status: Active Ulcer/skin breakdown will have a volume reduction of 30% by week 4 Date Initiated: 12/11/2016 Target Resolution Date: 03/13/2017 Goal Status: Active Ulcer/skin breakdown will have a volume reduction of 50% by week 8 Date Initiated: 12/11/2016 Target Resolution Date: 03/13/2017 Goal Status: Active Ulcer/skin breakdown will have a volume reduction of 80% by week 12 Date Initiated: 12/11/2016 Target Resolution Date: 03/13/2017 Goal Status:  Active Ulcer/skin breakdown will heal within 14 weeks Date Initiated: 12/11/2016 Target Resolution Date: 03/13/2017 Goal Status: Active Interventions: Assess patient/caregiver ability to obtain necessary supplies Assess patient/caregiver ability to perform ulcer/skin care regimen upon admission and as needed Assess ulceration(s) every visit Provide education on ulcer and skin care Treatment Activities: Skin care regimen initiated : 12/11/2016 Topical wound management initiated : 12/11/2016 Notes: Electronic Signature(s) Signed: 12/21/2018 4:11:30 PM By: Harold Barban Entered By: Harold Barban on 12/21/2018 08:39:18 [name] ([id number]) -------------------------------------------------------------------------------- Pain Assessment Details Patient Name: [name] Date of Service: 12/21/2018 8:15 AM Medical Record Number: [id number] Patient Account Number: [id number] Date of Birth/Sex: 01-Feb-1981 (37 y.o. M) Treating RN: Montey Hora Primary Care Jaquia Benedicto: [name] Other Clinician: Referring Vala Raffo: [name] Treating Gilverto Dileonardo/Extender: Melburn Hake, [name]: 106 Active Problems Location of Pain Severity and Description of Pain Patient Has Paino No Site Locations Pain Management and Medication Current Pain Management: Electronic Signature(s) Signed: 12/21/2018 3:29:41 PM By: Montey Hora Entered By: Montey Hora on 12/21/2018 08:26:59 [name] ([id number]) -------------------------------------------------------------------------------- Patient/Caregiver Education Details Patient Name: [name] Date of  Service: 12/21/2018 8:15 AM Medical Record Number: [id number] Patient Account Number: [id number] Date of Birth/Gender: April 17, 1981 (37 y.o. M) Treating RN: Harold Barban Primary Care Physician: [name] Other Clinician: Referring Physician: [name] Treating Physician/Extender: Sharalyn Ink in Treatment:  106 Education Assessment Education Provided To: Patient Education Topics Provided Venous: Handouts: Controlling Swelling with Compression Stockings Methods: Demonstration, Explain/Verbal Responses: State content correctly Wound/Skin Impairment: Handouts: Caring for Your Ulcer Methods: Demonstration, Explain/Verbal Responses: State content correctly Electronic Signature(s) Signed: 12/21/2018 4:11:30 PM By: Harold Barban Entered By: Harold Barban on 12/21/2018 08:39:56 Sorber, [name] ([id number]) -------------------------------------------------------------------------------- Wound Assessment Details Patient Name: [name] Date of Service: 12/21/2018 8:15 AM Medical Record Number: [id number] Patient Account Number: [id number] Date of Birth/Sex: 1980/11/01 (37 y.o. M) Treating RN: Montey Hora Primary Care Unnamed Hino: [name] Other Clinician: Referring Micaela Stith: [name] Treating Duwan Adrian/Extender: Melburn Hake, [name]: 106 Wound Status Wound Number: 1 Primary Etiology: Pyoderma Wound Location: Left Lower Leg - Lateral Wound Status: Open Wounding Event: Gradually Appeared Comorbid History: Sleep Apnea, Hypertension, Colitis Date Acquired: 11/18/2015 Weeks Of Treatment: 106 Clustered Wound: No Photos Wound Measurements Length: (cm) 2.9 % Reduction Width: (cm) 1.6 % Reduction Depth: (cm) 0.2 Epitheliali Area: (cm) 3.644 Tunneling: Volume: (cm) 0.729 Underminin in Area: 25.8% in Volume: 81.4% zation: None No g: No Wound Description Full Thickness With Exposed Support Foul Odor A Classification: Structures Slough/Fibr Wound Margin: Distinct, outline attached Exudate Large Amount: Exudate Type: Serous Exudate Color: amber fter Cleansing: No ino Yes Wound Bed Granulation Amount: Medium (34-66%) Exposed Structure Granulation Quality: Red Fascia Exposed: No Necrotic Amount: Medium (34-66%) Fat Layer (Subcutaneous Tissue)  Exposed: Yes Necrotic Quality: Adherent Slough Tendon Exposed: No Muscle Exposed: No Joint Exposed: No Bone Exposed: No Yearby, Rehman ([id number]) Treatment Notes Wound #1 (Left, Lateral Lower Leg) Notes TCA, BFD Electronic Signature(s) Signed: 12/21/2018 3:29:41 PM By: Montey Hora Entered By: Montey Hora on 12/21/2018 08:33:33 Lamarque, [name] ([id number]) -------------------------------------------------------------------------------- [place] Details Patient Name: [name] Date of Service: 12/21/2018 8:15 AM Medical Record Number: [id number] Patient Account Number: [id number] Date of Birth/Sex: 1981/03/24 (37 y.o. M) Treating RN: Montey Hora Primary Care Burnett Lieber: [name] Other Clinician: Referring Rylynn Schoneman: [name] Treating Erandi Lemma/Extender: Melburn Hake, [name]: 106 Vital Signs Time Taken: 08:27 Temperature (F): 98.2 Height (in): 71 Pulse (bpm): 77 Weight (lbs): 338 Respiratory Rate (breaths/min): 18 Body Mass Index (BMI): 47.1 Blood Pressure (mmHg): 137/85 Reference Range: 80 - 120 mg / dl Electronic Signature(s) Signed: 12/21/2018 3:29:41 PM By: Montey Hora Entered By: Montey Hora on 12/21/2018 08:29:35

## 2018-12-21 NOTE — Progress Notes (Addendum)
GREELY, [name] ([id number]) Visit Report for 12/21/2018 Chief Complaint Document Details Patient Name: [name], [name] Date of Service: 12/21/2018 8:15 AM Medical Record Number: [id number] Patient Account Number: [id number] Date of Birth/Sex: 12-20-1980 (38 y.o. M) Treating RN: Harold Barban Primary Care Provider: [name] Other Clinician: Referring Provider: [name] Treating Provider/Extender: Melburn Hake, Lucia Mccreadie Weeks in Treatment: 106 Information Obtained from: Patient Chief Complaint He is here in follow up evaluation for LLE pyoderma ulcer Electronic Signature(s) Signed: 12/21/2018 8:24:42 AM By: Worthy Keeler PA-C Entered By: Worthy Keeler on 12/21/2018 08:24:41 [name] ([id number]) -------------------------------------------------------------------------------- HPI Details Patient Name: [name] Date of Service: 12/21/2018 8:15 AM Medical Record Number: [id number] Patient Account Number: [id number] Date of Birth/Sex: [birth year] (37 y.o. M) Treating RN: Harold Barban Primary Care Provider: [name] Other Clinician: Referring Provider: [name] Treating Provider/Extender: Melburn Hake, Jameison Haji Weeks in Treatment: 106 History of Present Illness HPI Description: 12/04/16; 38 year old man who comes into the clinic today for review of a wound on the posterior left calf. He tells me that is been there for about a year. He is not a diabetic he does smoke half a pack per day. He was seen in the ER on 11/20/16 felt to have cellulitis around the wound and was given clindamycin. An x-ray did not show osteomyelitis. The patient initially tells me that he has a milk allergy that sets off a pruritic itching rash on his lower legs which she scratches incessantly and he thinks that's what may have set up the wound. He has been using various topical antibiotics and ointments without any effect. He works in a [employer] and is on his feet all day. He does not have a  prior history of wounds however he does have the rash on both lower legs the right arm and the ventral aspect of his left arm. These are excoriations and clearly have had scratching however there are of macular looking areas on both legs including a substantial larger area on the right leg. This does not have an underlying open area. There is no blistering. The patient tells me that 2 years ago in [place] in response to the rash on his legs he saw a dermatologist who told him he had a condition which may be pyoderma gangrenosum although I may be putting words into his mouth. He seemed to recognize this. 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 He is not in any treatment for this. He's had no recent travel 12/11/16; the patient arrives today with his wound and roughly the same condition we've been using silver alginate this is a deep punched out wound with some surrounding erythema but no tenderness. Biopsy I did did not show confirmed pyoderma gangrenosum suggested nonspecific inflammation and vasculitis but does not provide an actual description of what was seen by the pathologist. I'm really not able to understand this We have also received information from the patient's dermatologist in [place] notes from April 2016. This was a doctor Agarwal- antal. 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 He was prescribed topical steroid high potency under occlusion which helped but at this point the patient did not have a deep punched out wound. 12/18/16; the patient's wound is larger in terms of surface area however this surface looks better and there is less depth. The surrounding erythema also is better. The patient states that the wrap we put on came off 2 days ago when he has been using  his compression stockings. He we are in the process of getting a dermatology consult. 12/26/16 on evaluation today patient's left lower extremity wound shows evidence  of infection with surrounding erythema noted. He has been tolerating the dressing changes but states that he has noted more discomfort. There is a larger area of erythema surrounding the wound. No fevers, chills, nausea, or vomiting noted at this time. With that being said the wound still does have slough covering the surface. He is not allergic to any medication that he is aware of at this point. In regard to his right lower extremity he had several regions that are erythematous and pruritic he wonders if there's anything we can do to help that. 01/02/17 I reviewed patient's wound culture which was obtained his visit last week. He was placed on doxycycline at that point. Unfortunately that does not appear to be an antibiotic that would likely help with the situation however the pseudomonas noted on culture is sensitive to Cipro. Also unfortunately patient's wound seems to have a large compared to last week's evaluation. Not severely so but there are definitely increased measurements in general. He is continuing to have discomfort as well he writes this to be a seven out of 10. In fact he would prefer me not to perform any debridement today due to the fact that he is having discomfort and considering he has an active infection on the little reluctant to do so anyway. No fevers, chills, nausea, or vomiting noted at this time. 01/08/17; patient seems dermatology on September 5. I suspect dermatology will want the slides from the biopsy I did sent to their pathologist. I'm not sure if there is a way we can expedite that. In any case the culture I did before I left on vacation 3 weeks ago showed Pseudomonas he was given 10 days of Cipro and per her description of her intake nurses is actually somewhat better this week although the wound is quite a bit bigger than I remember the last time I saw this. He still has 3 more days of Cipro [name] ([id number]) 01/21/17; dermatology appointment tomorrow. He  has completed the ciprofloxacin for Pseudomonas. Surface of the wound looks better however he is had some deterioration in the lesions on his right leg. Meantime the left lateral leg wound we will continue with sample 01/29/17; patient had his dermatology appointment but I can't yet see that note. He is completed his antibiotics. The wound is more superficial but considerably larger in circumferential area than when he came in. This is in his left lateral calf. He also has swollen erythematous areas with superficial wounds on the right leg and small papular areas on both arms. There apparently areas in her his upper thighs and buttocks I did not look at those. Dermatology biopsied the right leg. Hopefully will have their input next week. 02/05/17; patient went back to see his dermatologist who told him that he had a "scratching problem" as well as staph. He is now on a 30 day course of doxycycline and I believe she gave him triamcinolone cream to the right leg areas to help with the itching [not exactly sure but probably triamcinolone]. She apparently looked at the left lateral leg wound although this was not rebiopsied and I think felt to be ultimately part of the same pathogenesis. He is using sample border foam and changing nevus himself. He now has a new open area on the right posterior leg which was his biopsy site I don't  have any of the dermatology notes 02/12/17; we put the patient in compression last week with SANTYL to the wound on the left leg and the biopsy. Edema is much better and the depth of the wound is now at level of skin. Area is still the same oBiopsy site on the right lateral leg we've also been using santyl with a border foam dressing and he is changing this himself. 02/19/17; Using silver alginate started last week to both the substantial left leg wound and the biopsy site on the right wound. He is tolerating compression well. Has a an appointment with his primary M.D. tomorrow  wondering about diuretics although I'm wondering if the edema problem is actually lymphedema 02/26/17; the patient has been to see his primary doctor Dr. Jerrel Ivory at [facility] our primary care. She started him on Lasix 20 mg and this seems to have helped with the edema. However we are not making substantial change with the left lateral calf wound and inflammation. The biopsy site on the right leg also looks stable but not really all that different. 03/12/17; the patient has been to see vein and vascular Dr. Lucky Cowboy. He has had venous reflux studies I have not reviewed these. I did get a call from his dermatology office. 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 They therefore recommended clobetasol and tetracycline i.e. 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 03/26/17; apparently the patient just had reflux studies not an appointment with Dr. dew. She arrives in clinic today having applied clobetasol for 2-3 weeks. He notes over the last 2-3 days excessive drainage having to change the dressing 3-4 times a day and also expanding erythema. 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 Lillia Carmel at [facility] dermatology locally who kindly saw him at my request. 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 My concern was the degree of erythema medially extending from his left leg wound which was either inflammation from  pyoderma or cellulitis. I put him on Augmentin however culture of the wound showed Pseudomonas which is quinolone sensitive. I really don't believe he has cellulitis however in view of everything I will continue and give him a course of Cipro. He is also on doxycycline as an immune modulator for the pyoderma. In addition to his original wound on the left lateral leg with surrounding erythema he has a wound on the right posterior calf which was an original biopsy site done by dermatology. 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 Saw Dr. Lillia Carmel yesterday. He has been using topical antibiotics to both wound areas his original wound on the left and the biopsies/pathergy area on the right. There is definitely some improvement in the inflammation around the wound on the right although the patient states he has increasing sensitivity of the wounds. He is on prednisone 60 and doxycycline 1 as prescribed by Dr. Lillia Carmel. He is covering the topical  antibiotic with gauze and putting this in his own compression stocks and changing this daily. He states that Dr. Lottie Rater did a culture of the left leg wound yesterday 05/07/17; pyoderma gangrenosum. The patient saw Dr. Lillia Carmel yesterday and has a follow-up with her in one month. He is still using topical antibiotics to both wounds although he can't recall exactly what type. He is still on prednisone 60 mg. Dr. Lillia Carmel stated that the doxycycline could stop if we were in agreement. 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 The right medial leg was induced by biopsy/pathergy. The area on the right is essentially healed. 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 The right medial leg has closed and  remains closed. He is still on prednisone 60. oHe tells me he missed his last dermatology appointment with Dr. Lillia Carmel but will make another appointment. He reports that her blood sugar at a recent screen in [place] was high 200's. He was 180 today. He is more cushingoid blood pressure is Krauter, Heaven ([id number]) up a bit. I think he is going to require still much longer prednisone perhaps another 3 months before attempting to taper. In the meantime his wound is a lot better. Smaller. He is cleaning this off daily and applying topical antibiotics. When he was last in the clinic I thought about changing to [facility] and actually put in a couple of calls to dermatology although probably not during their business hours. 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 He continues on oral prednisone. He has been using triple antibiotic ointment. There is surface debris and we will transition to [facility] and have him return in 2 weeks. He has lost 30 pounds since his last appointment with lifestyle modification. He may benefit from topical steroid cream for treatment this can be considered at a later date. 08/22/17 on evaluation today patient appears to actually be doing rather well in regard to his left lateral lower extremity ulcer. He has actually been managed by Dr. Dellia Nims most recently. Patient is currently on oral steroids at this time. This seems to have been of benefit for him. Nonetheless his last visit was actually with Leah on 08/06/17. Currently he is not utilizing any topical steroid creams although this could be of benefit as well. No fevers, chills, nausea, or vomiting noted at this time. 09/05/17 on evaluation today patient appears to be doing better in regard to his left lateral lower extremity ulcer. He has been tolerating the dressing changes without complication.  He is using Santyl with good effect. Overall I'm very pleased with how things are standing at this point. Patient likewise is happy that this is doing better. 09/19/17 on evaluation today patient actually appears to be doing rather well in regard to his left lateral lower extremity ulcer. 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 He's not having any significant pain. 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 He has been tolerating the Santyl without complication and in general I feel like he's making good  progress. 10/17/17 on evaluation today patient appears to be doing very well in regard to his left lateral lower surety ulcer. He has been tolerating the dressing changes without complication. There does not appear to be any evidence of infection he's alternating the Santyl and the triple antibiotic ointment every other day this seems to be doing well for him. 11/03/17 on evaluation today patient appears to be doing very well in regard to his left lateral lower extremity ulcer. He is been tolerating the dressing changes without complication which is good news. Fortunately there does not appear to be any evidence of infection which is also great news. Overall is doing excellent they are starting to taper down on the prednisone is down to 40 mg at this point it also started topical clobetasol for him. 11/17/17 on evaluation today patient appears to be doing well in regard to his left lateral lower surety ulcer. He's been tolerating the dressing changes without complication. He does note that he is having no pain, no excessive drainage or discharge, and overall he feels like things are going about how he would expect and hope they would. Overall he seems to have no evidence of infection at this time in my opinion which is  good news. 12/04/17-He is seen in follow-up evaluation for right lateral lower extremity ulcer. He has been applying topical steroid cream. Today's measurement show slight increase in size. Over the next 2 weeks we will transition to every other day Santyl and steroid cream. He has been encouraged to monitor for changes and notify clinic with any concerns 12/15/17 on evaluation today patient's left lateral motion the ulcer and fortunately is doing worse again at this point. This just since last week to this week has close to doubled in size according to the patient. I did not seeing last week's I do not have a visual to compare this to in our system was also down so we do not have all the charts and at this point. Nonetheless it does have me somewhat concerned in regard to the fact that again he was worried enough about it he has contact the dermatology that placed them back on the full strength, 50 mg a day of the prednisone that he was taken previous. He continues to alternate using clobetasol along with Santyl at this point. He is obviously somewhat frustrated. 12/22/17 on evaluation today patient appears to be doing a little worse compared to last evaluation. Unfortunately the wound is a little deeper and slightly larger than the last week's evaluation. With that being said he has made some progress in regard to the irritation surrounding at this time unfortunately despite that progress that's been made he still has a significant issue going on here. 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 For that reason I discussed with him today the possibility of perform a wound culture to ensure there's nothing overtly infected. 01/06/18 on evaluation today patient's wound is larger and deeper than previously evaluated. With that being said it did appear that  his wound was infected after my last evaluation with him. Subsequently I did end up prescribing a prescription for Bactrim DS which she has been taking and having no complication with. Fortunately there does not appear to be any evidence of Holts, Muneer ([id number]) infection at this point in time as far as anything spreading, no want to  touch, and overall I feel like things are showing signs of improvement. 01/13/18 on evaluation today patient appears to be even a little larger and deeper than last time. There still muscle exposed in the base of the wound. Nonetheless he does appear to be less erythematous I do believe inflammation is calming down also believe the infection looks like it's probably resolved at this time based on what I'm seeing. No fevers, chills, nausea, or vomiting noted at this time. 01/30/18 on evaluation today patient actually appears to visually look better for the most part. Unfortunately those visually this looks better he does seem to potentially have what may be an abscess in the muscle that has been noted in the central portion of the wound. This is the first time that I have noted what appears to be fluctuance in the central portion of the muscle. With that being said I'm somewhat more concerned about the fact that this might indicate an abscess formation at this location. I do believe that an ultrasound would be appropriate. This is likely something we need to try to do as soon as possible. He has been switch to mupirocin ointment and he is no longer using the steroid ointment as prescribed by dermatology he sees them again next week he's been decreased from 60 to 40 mg of prednisone. 03/09/18 on evaluation today patient actually appears to be doing a little better compared to last time I saw him. There's not as much erythema surrounding the wound itself. He I did review his most recent infectious disease note which was dated 02/24/18. He saw Dr. Michel Bickers in  [place]. 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 Patient is now off of antibiotics and they are continually observing this. There seems to be no change in the past two weeks in my pinion based on what the patient says and what I see today compared to what Dr. Megan Salon likely saw two weeks ago. No fevers, chills, nausea, or vomiting noted at this time. 03/23/18 on evaluation today patient's wound actually appears to be showing signs of improvement which is good news. He is currently still on the Dapsone. He is also working on tapering the prednisone to get off of this and Dr. Lottie Rater is working with him in this regard. Nonetheless overall I feel like the wound is doing well it does appear based on the infectious disease note that I reviewed from Dr. 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 I did also review the results of his ultrasound of left lower extremity which revealed there was a dentist tissue in the base of the wound without an abscess noted. 04/06/18 on evaluation today the patient's left lateral lower extremity ulcer actually appears to be doing fairly well which is excellent news. There does not appear to be any evidence of infection at this time which is also great news. Overall he still does have a significantly large ulceration although little by little he seems to be making progress. He is down to 10 mg a day of the prednisone. 04/20/18 on evaluation today patient actually appears to be doing excellent at this time in regard to his left lower extremity ulcer. He's making signs of good progress unfortunately this is taking much longer than we would really like  to see but nonetheless he is making progress. Fortunately there does not appear to be any evidence of infection at this  time. No fevers, chills, nausea, or vomiting noted at this time. The patient has not been using the Santyl due to the cost he hadn't got in this field yet. He's mainly been using the antibiotic ointment topically. Subsequently he also tells me that he really has not been scrubbing in the shower I think this would be helpful again as I told him it doesn't have to be anything too aggressive to even make it believe just enough to keep it free of some of the loose slough and biofilm on the wound surface. 05/11/18 on evaluation today patient's wound appears to be making slow but sure progress in regard to the left lateral lower extremity ulcer. He is been tolerating the dressing changes without complication. Fortunately there does not appear to be any evidence of infection at this time. He is still just using triple antibiotic ointment along with clobetasol occasionally over the area. He never got the Santyl and really does not seem to intend to in my pinion. 06/01/18 on evaluation today patient appears to be doing a little better in regard to his left lateral lower extremity ulcer. He states that overall he does not feel like he is doing as well with the Dapsone as he did with the prednisone. Nonetheless he sees his dermatologist later today and is gonna talk to them about the possibility of going back on the prednisone. Overall again I believe that the wound would be better if you would utilize Santyl but he really does not seem to be interested in going back to the [place] at this point. He has been using triple antibiotic ointment. 06/15/18 on evaluation today patient's wound actually appears to be doing about the same at this point. Fortunately there is no signs of infection at this time. He has made slight improvements although he continues to not really want to clean the wound bed at this point. He states that he just doesn't mess with it he doesn't want to cause any problems with everything else  he has going on. He has been on medication, antibiotics as prescribed by his dermatologist, for a staff infection of his lower extremities which is really drying out now and looking much better he tells me. Fortunately there is no sign of overall infection. ROMAN, DUBUC ([id number]) 06/29/18 on evaluation today patient appears to be doing well in regard to his left lateral lower surety ulcer all things considering. Fortunately his staff infection seems to be greatly improved compared to previous. He has no signs of infection and this is drying up quite nicely. He is still the doxycycline for this is no longer on cental, Dapsone, or any of the other medications. His dermatologist has recommended possibility of an infusion but right now he does not want to proceed with that. 07/13/18 on evaluation today patient appears to be doing about the same in regard to his left lateral lower surety ulcer. Fortunately there's no signs of infection at this time which is great news. Unfortunately he still builds up a significant amount of Slough/biofilm of the surface of the wound he still is not really cleaning this as he should be appropriately. Again I'm able to easily with saline and gauze remove the majority of this on the surface which if you would do this at home would likely be a dramatic improvement for him as far as getting the area  to improve. Nonetheless overall I still feel like he is making progress is just very slow. I think Santyl will be of benefit for him as well. Still he has not gotten this as of this point. 07/27/18 on evaluation today patient actually appears to be doing little worse in regards of the erythema around the periwound region of the wound he also tells me that he's been having more drainage currently compared to what he was experiencing last time I saw him. He states not quite as bad as what he had because this was infected previously but nonetheless is still appears to be doing  poorly. Fortunately there is no evidence of systemic infection at this point. The patient tells me that he is not going to be able to afford the Santyl. He is still waiting to hear about the infusion therapy with his dermatologist. Apparently she wants an updated colonoscopy first. 08/10/18 on evaluation today patient appears to be doing better in regard to his left lateral lower extremity ulcer. Fortunately he is showing signs of improvement in this regard he's actually been approved for Remicade infusion's as well although this has not been scheduled as of yet. Fortunately there's no signs of active infection at this time in regard to the wound although he is having some issues with infection of the right lower extremity is been seen as dermatologist for this. Fortunately they are definitely still working with him trying to keep things under control. 09/07/18 on evaluation today patient is actually doing rather well in regard to his left lateral lower extremity ulcer. He notes these actually having some hair grow back on his extremity which is something he has not seen in years. He also tells me that the pain is really not giving them any trouble at this time which is also good news overall she is very pleased with the progress he's using a combination of the mupirocin along with the probate is all mixed. 09/21/18 on evaluation today patient actually appears to be doing fairly well all things considered in regard to his looks from the ulcer. He's been tolerating the dressing changes without complication. Fortunately there's no signs of active infection at this time which is good news he is still on all antibiotics or prevention of the staff infection. He has been on prednisone for time although he states it is gonna contact his dermatologist and see if she put them on a short course due to some irritation that he has going on currently. Fortunately there's no evidence of any overall worsening this is  going very slow I think cental would be something that would be helpful for him although he states that $50 for tube is quite expensive. He therefore is not willing to get that at this point. 10/06/18 on evaluation today patient actually appears to be doing decently well in regard to his left lateral leg ulcer. He's been tolerating the dressing changes without complication. Fortunately there's no signs of active infection at this time. Overall I'm actually rather pleased with the progress he's making although it's slow he doesn't show any signs of infection and he does seem to be making some improvement. I do believe that he may need a switch up and dressings to try to help this to heal more appropriately and quickly. 10/19/18 on evaluation today patient actually appears to be doing better in regard to his left lateral lower extremity ulcer. This is shown signs of having much less Slough buildup at this point due to  the fact he has been using the Santyl. Obviously this is very good news. The overall size of the wound is not dramatically smaller but again the appearance is. 11/02/18 on evaluation today patient actually appears to be doing quite well in regard to his lower Trinity ulcer. A lot of the skin around the ulcer is actually somewhat irritating at this point this seems to be more due to the dressing causing irritation from the adhesive that anything else. Fortunately there is no signs of active infection at this time. 11/24/18 on evaluation today patient appears to be doing a little worse in regard to his overall appearance of his lower extremity ulcer. There's more erythema and warmth around the wound unfortunately. He is currently on doxycycline which he has been on for some time. With that being said I'm not sure that seems to be helping with what appears to possibly be an acute cellulitis with regard to his left lower extremity ulcer. No fevers, chills, nausea, or vomiting noted at this  time. 12/08/18 on evaluation today patient's wounds actually appears to be doing significantly better compared to his last evaluation. He has been using Santyl along with alternating tripling about appointment as well as the steroid cream seems to be doing Widdowson, Tahj ([id number]) quite well and the wound is showing signs of improvement which is excellent news. Fortunately there's no evidence of infection and in fact his culture came back negative with only normal skin flora noted. 12/21/2018 upon evaluation today patient actually appears to be doing excellent with regard to his ulcer. This is actually the best that I have seen it since have been helping to take care of him. It is both smaller as well as less slough noted on the surface of the wound and seems to be showing signs of good improvement with new skin growing from the edges. He has been using just the triamcinolone he does wonder if he can get a refill of that ointment today. Electronic Signature(s) Signed: 12/21/2018 8:48:56 AM By: Worthy Keeler PA-C Entered By: Worthy Keeler on 12/21/2018 08:48:56 MELBOURNE, JAKUBIAK ([id number]) -------------------------------------------------------------------------------- Physical Exam Details Patient Name: [name] Date of Service: 12/21/2018 8:15 AM Medical Record Number: [id number] Patient Account Number: [id number] Date of Birth/Sex: 1980-12-08 (37 y.o. M) Treating RN: Harold Barban Primary Care Provider: [name] Other Clinician: Referring Provider: [name] Treating Provider/Extender: Melburn Hake, Sun Wilensky Weeks in Treatment: 106 Constitutional Obese and well-hydrated in no acute distress. Respiratory normal breathing without difficulty. clear to auscultation bilaterally. Cardiovascular regular rate and rhythm with normal S1, S2. Psychiatric this patient is able to make decisions and demonstrates good insight into disease process. Alert and Oriented x 3. pleasant and  cooperative. Notes Upon inspection patient's wound bed showed signs of good granulation at this time there does not appear to be any evidence of active infection which is good news. No debridement was performed today although to be honest he really has minimal slough noted anyway and much more granulation I am very pleased with the overall appearance and I think that the triamcinolone has been beneficial for him. He quit using the Santyl as it was causing too much drainage though I do believe it cleared away a lot of the slough that we were dealing with here. Electronic Signature(s) Signed: 12/21/2018 8:49:36 AM By: Worthy Keeler PA-C Entered By: Worthy Keeler on 12/21/2018 08:49:35 SILVANO, GAROFANO ([id number]) -------------------------------------------------------------------------------- Physician Orders Details Patient Name: [name] Date of Service: 12/21/2018 8:15 AM Medical Record  Number: [id number] Patient Account Number: [id number] Date of Birth/Sex: 1981-02-19 (37 y.o. 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 Anesthetic (add to Medication List) Wound #1 Left,Lateral Lower Leg o Topical Lidocaine 4% cream applied to wound bed prior to debridement (In Clinic Only). Primary Wound Dressing Wound #1 Left,Lateral Lower Leg o Other: - TCA Ointment Secondary Dressing Wound #1 Left,Lateral Lower Leg o Boardered Foam Dressing Dressing Change Frequency Wound #1 Left,Lateral Lower Leg o Change dressing every  day. Follow-up Appointments Wound #1 Left,Lateral Lower Leg o Return Appointment in 2 weeks. Edema Control Wound #1 Left,Lateral Lower Leg o Elevate legs to the level of the heart and pump ankles as often as possible Medications-please add to medication list. Wound #1 Left,Lateral Lower Leg o Other: - TCA Ointment Patient Medications Allergies: milk, Biaxin, seasonal Fillinger, Umar ([id number]) Notifications Medication Indication Start End triamcinolone acetonide 12/21/2018 DOSE topical 0.1 % ointment - ointment topical applied to the wound bed and around the wound as directed. Electronic Signature(s) Signed: 12/21/2018 8:51:01 AM By: Worthy Keeler PA-C Entered By: Worthy Keeler on 12/21/2018 08:51:00 MAYJOR, AGER ([id number]) -------------------------------------------------------------------------------- Problem List Details Patient Name: [name] Date of Service: 12/21/2018 8:15 AM Medical Record Number: [id number] Patient Account Number: [id number] Date of Birth/Sex: 09/24/1980 (37 y.o. M) Treating RN: Harold Barban Primary Care Provider: [name] Other Clinician: Referring Provider: [name] Treating Provider/Extender: Melburn Hake, Leveta Wahab Weeks in Treatment: 106 Active Problems ICD-10 Evaluated Encounter Code Description Active Date Today Diagnosis L97.222 Non-pressure chronic ulcer of left calf with fat layer exposed 12/04/2016 No Yes L88 Pyoderma gangrenosum 03/26/2017 No Yes I87.2 Venous insufficiency (chronic) (peripheral) 12/04/2016 No Yes Inactive Problems ICD-10 Code Description Active Date Inactive Date L97.213 Non-pressure chronic ulcer of right calf with necrosis of muscle 04/02/2017 04/02/2017 Resolved Problems Electronic Signature(s) Signed: 12/21/2018 8:24:35 AM By: Worthy Keeler PA-C Entered By: Worthy Keeler on 12/21/2018 08:24:35 Mongillo, [name]  ([id number]) -------------------------------------------------------------------------------- Progress Note Details Patient Name: [name] Date of Service: 12/21/2018 8:15 AM Medical Record Number: [id number] Patient Account Number: [id number] Date of Birth/Sex: 11/13/1980 (37 y.o. M) Treating RN: Harold Barban Primary Care Provider: [name] Other Clinician: Referring Provider: [name] Treating Provider/Extender: Melburn Hake, Javiel Canepa Weeks in Treatment: 106 Subjective Chief Complaint Information obtained from Patient He is here in follow up evaluation for LLE pyoderma ulcer History of Present Illness (HPI) 12/04/16; 38 year old man who comes into the clinic today for review of a wound on the posterior left calf. He tells me that is been there for about a year. He is not a diabetic he does smoke half a pack per day. He was seen in the ER on 11/20/16 felt to have cellulitis around the wound and was given clindamycin. An x-ray did not show osteomyelitis. The patient initially tells me that he has a milk allergy that sets off a pruritic itching rash on his lower legs which she scratches incessantly and he thinks that's what may have set up the wound. He has been using various topical antibiotics and ointments without any effect. He works in a [employer]ing [employer] and is on his feet all day. He does not  have a prior history of wounds however he does have the rash on both lower legs the right arm and the ventral aspect of his left arm. These are excoriations and clearly have had scratching however there are of macular looking areas on both legs including a substantial larger area on the right leg. This does not have an underlying open area. There is no blistering. The patient tells me that 2 years ago in [place] in response to the rash on his legs he saw a dermatologist who told him he had a condition which may be pyoderma gangrenosum although I may be putting words into his mouth. He seemed to  recognize this. 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 He is not in any treatment for this. He's had no recent travel 12/11/16; the patient arrives today with his wound and roughly the same condition we've been using silver alginate this is a deep punched out wound with some surrounding erythema but no tenderness. Biopsy I did did not show confirmed pyoderma gangrenosum suggested nonspecific inflammation and vasculitis but does not provide an actual description of what was seen by the pathologist. I'm really not able to understand this We have also received information from the patient's dermatologist in [place] notes from April 2016. This was a doctor Agarwal- antal. 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 He was prescribed topical steroid high potency under occlusion which helped but at this point the patient did not have a deep punched out wound. 12/18/16; the patient's wound is larger in terms of surface area however this surface looks better and there is less depth. The surrounding erythema also is better. The patient states that the wrap we put on came off 2 days ago when he has been using his compression stockings. He we are in the process of getting a dermatology consult. 12/26/16 on evaluation today patient's left lower extremity wound shows evidence of infection with surrounding erythema noted. He has been tolerating the dressing changes but states that he has noted more discomfort. There is a larger area of erythema surrounding the wound. No fevers, chills, nausea, or vomiting noted at this time. With that being said the wound still does have slough covering the surface. He is not allergic to any medication that he is aware of at this point. In regard to his right lower extremity he had several regions that are erythematous and pruritic he wonders if there's anything we can do to help that. 01/02/17 I reviewed patient's wound  culture which was obtained his visit last week. He was placed on doxycycline at that point. Unfortunately that does not appear to be an antibiotic that would likely help with the situation however the pseudomonas noted on culture is sensitive to Cipro. Also unfortunately patient's wound seems to have a large compared to last week's evaluation. Not severely so but there are definitely increased measurements in general. He is continuing to have discomfort as well he writes this to be a seven out of 10. In fact he would prefer me not to perform any debridement today due to the fact that he is having discomfort and considering he has an active infection on the little reluctant to do so anyway. No fevers, Grunert, Obe ([id number]) chills, nausea, or vomiting noted at this time. 01/08/17; patient seems dermatology on September 5. I suspect dermatology will want the slides from the biopsy I did sent to their pathologist. I'm not sure if there is  a way we can expedite that. In any case the culture I did before I left on vacation 3 weeks ago showed Pseudomonas he was given 10 days of Cipro and per her description of her intake nurses is actually somewhat better this week although the wound is quite a bit bigger than I remember the last time I saw this. He still has 3 more days of Cipro 01/21/17; dermatology appointment tomorrow. He has completed the ciprofloxacin for Pseudomonas. Surface of the wound looks better however he is had some deterioration in the lesions on his right leg. Meantime the left lateral leg wound we will continue with sample 01/29/17; patient had his dermatology appointment but I can't yet see that note. He is completed his antibiotics. The wound is more superficial but considerably larger in circumferential area than when he came in. This is in his left lateral calf. He also has swollen erythematous areas with superficial wounds on the right leg and small papular areas on both arms.  There apparently areas in her his upper thighs and buttocks I did not look at those. Dermatology biopsied the right leg. Hopefully will have their input next week. 02/05/17; patient went back to see his dermatologist who told him that he had a "scratching problem" as well as staph. He is now on a 30 day course of doxycycline and I believe she gave him triamcinolone cream to the right leg areas to help with the itching [not exactly sure but probably triamcinolone]. She apparently looked at the left lateral leg wound although this was not rebiopsied and I think felt to be ultimately part of the same pathogenesis. He is using sample border foam and changing nevus himself. He now has a new open area on the right posterior leg which was his biopsy site I don't have any of the dermatology notes 02/12/17; we put the patient in compression last week with SANTYL to the wound on the left leg and the biopsy. Edema is much better and the depth of the wound is now at level of skin. Area is still the same Biopsy site on the right lateral leg we've also been using santyl with a border foam dressing and he is changing this himself. 02/19/17; Using silver alginate started last week to both the substantial left leg wound and the biopsy site on the right wound. He is tolerating compression well. Has a an appointment with his primary M.D. tomorrow wondering about diuretics although I'm wondering if the edema problem is actually lymphedema 02/26/17; the patient has been to see his primary doctor Dr. Jerrel Ivory at [facility] our primary care. She started him on Lasix 20 mg and this seems to have helped with the edema. However we are not making substantial change with the left lateral calf wound and inflammation. The biopsy site on the right leg also looks stable but not really all that different. 03/12/17; the patient has been to see vein and vascular Dr. Lucky Cowboy. He has had venous reflux studies I have not reviewed these. I  did get a call from his dermatology office. 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 They therefore recommended clobetasol and tetracycline i.e. 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 03/26/17; apparently the  patient just had reflux studies not an appointment with Dr. dew. She arrives in clinic today having applied clobetasol for 2-3 weeks. He notes over the last 2-3 days excessive drainage having to change the dressing 3-4 times a day and also expanding erythema. 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 Lillia Carmel at [facility] dermatology locally who kindly saw him at my request. 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 My concern was the degree of erythema medially extending from his left leg wound which was either inflammation from pyoderma or cellulitis. I put him on Augmentin however culture of the wound showed Pseudomonas which is quinolone sensitive. I really don't believe he has cellulitis however in view of everything I will continue and give him a course of Cipro. He is also on doxycycline as an immune modulator for the pyoderma. In addition to his original wound on the left lateral leg with surrounding erythema he has a wound on the right posterior calf which was an original biopsy site done by dermatology. This was felt to represent pathergy from pyoderma gangrenosum 04/16/17; pyoderma  gangrenosum. Saw Dr. Lillia Carmel yesterday. He has been using topical antibiotics to both wound areas his original wound on the left and the biopsies/pathergy area on the right. There is definitely some improvement in the inflammation around the wound on the right although the patient states he has increasing sensitivity of the wounds. He is on prednisone 60 and doxycycline 1 as prescribed by Dr. Lillia Carmel. He is covering the topical antibiotic with gauze and putting this in his own compression stocks and changing this daily. He states that Dr. Lottie Rater did a culture of the left leg wound yesterday 05/07/17; pyoderma gangrenosum. The patient saw Dr. Lillia Carmel yesterday and has a follow-up with her in one month. He is still using topical antibiotics to both wounds although he can't recall exactly what type. He is still on prednisone 60 mg. Dr. Lillia Carmel stated that the doxycycline could stop if we were in agreement. 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 The right medial leg was induced by [name] ([id number]) biopsy/pathergy. The area on the right is essentially healed. 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 The right medial leg has closed and remains closed. He is still on prednisone 60. He tells me he missed his last dermatology appointment with Dr. Lillia Carmel but will make another appointment. He reports that her blood sugar at a recent screen in [place] was high 200's. He was 180 today. 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 I think he is going to require still much longer prednisone perhaps another 3 months before attempting to taper. In the meantime his wound is a lot better. Smaller. He is cleaning this off daily and applying topical antibiotics. When he was last  in the clinic I thought about changing to [facility] and actually put in a couple of calls to dermatology although probably not during their business hours. In any case the wound looks better smaller I don't think there is  any need to change what he is doing 08/06/17-he is here in follow up evaluation for pyoderma left leg ulcer. He continues on oral prednisone. He has been using triple antibiotic ointment. There is surface debris and we will transition to [facility] and have him return in 2 weeks. He has lost 30 pounds since his last appointment with lifestyle modification. He may benefit from topical steroid cream for treatment this can be considered at a later date. 08/22/17 on evaluation today patient appears to actually be doing rather well in regard to his left lateral lower extremity ulcer. He has actually been managed by Dr. Dellia Nims most recently. Patient is currently on oral steroids at this time. This seems to have been of benefit for him. Nonetheless his last visit was actually with Leah on 08/06/17. Currently he is not utilizing any topical steroid creams although this could be of benefit as well. No fevers, chills, nausea, or vomiting noted at this time. 09/05/17 on evaluation today patient appears to be doing better in regard to his left lateral lower extremity ulcer. He has been tolerating the dressing changes without complication. He is using Santyl with good effect. Overall I'm very pleased with how things are standing at this point. Patient likewise is happy that this is doing better. 09/19/17 on evaluation today patient actually appears to be doing rather well in regard to his left lateral lower extremity ulcer. 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 He's not having any significant pain. 10/03/17 on evaluation today patient appears to be doing excellent in regard to his lower extremity wound on the left secondary to  Pyoderma gangrenosum. He has been tolerating the Santyl without complication and in general I feel like he's making good progress. 10/17/17 on evaluation today patient appears to be doing very well in regard to his left lateral lower surety ulcer. He has been tolerating the dressing changes without complication. There does not appear to be any evidence of infection he's alternating the Santyl and the triple antibiotic ointment every other day this seems to be doing well for him. 11/03/17 on evaluation today patient appears to be doing very well in regard to his left lateral lower extremity ulcer. He is been tolerating the dressing changes without complication which is good news. Fortunately there does not appear to be any evidence of infection which is also great news. Overall is doing excellent they are starting to taper down on the prednisone is down to 40 mg at this point it also started topical clobetasol for him. 11/17/17 on evaluation today patient appears to be doing well in regard to his left lateral lower surety ulcer. He's been tolerating the dressing changes without complication. He does note that he is having no pain, no excessive drainage or discharge, and overall he feels like things are going about how he would expect and hope they would. Overall he seems to have no evidence of infection at this time in my opinion which is good news. 12/04/17-He is seen in follow-up evaluation for right lateral lower extremity ulcer. He has been applying topical steroid cream. Today's measurement show slight increase in size. Over the next 2 weeks we will transition to every other day Santyl and steroid cream. He has been encouraged to monitor for changes and notify clinic with any concerns 12/15/17 on evaluation today patient's left lateral motion the ulcer and fortunately is doing worse again at this point. This  just since last week to this week has close to doubled in size according to the patient. I did  not seeing last week's I do not have a visual to compare this to in our system was also down so we do not have all the charts and at this point. Nonetheless it does have me somewhat concerned in regard to the fact that again he was worried enough about it he has contact the dermatology that placed them back on the full strength, 50 mg a day of the prednisone that he was taken previous. He continues to alternate using clobetasol along with Santyl at this point. He is obviously somewhat frustrated. 12/22/17 on evaluation today patient appears to be doing a little worse compared to last evaluation. Unfortunately the wound is a little deeper and slightly larger than the last week's evaluation. With that being said he has made some progress in regard to the irritation surrounding at this time unfortunately despite that progress that's been made he still has a significant issue going on here. I'm not certain that he is having really any true infection at this time although with the Pyoderma gangrenosum it can Elizardo, Jermane ([id number]) sometimes be difficult to differentiate infection versus just inflammation. For that reason I discussed with him today the possibility of perform a wound culture to ensure there's nothing overtly infected. 01/06/18 on evaluation today patient's wound is larger and deeper than previously evaluated. With that being said it did appear that his wound was infected after my last evaluation with him. Subsequently I did end up prescribing a prescription for Bactrim DS which she has been taking and having no complication with. Fortunately there does not appear to be any evidence of infection at this point in time as far as anything spreading, no want to touch, and overall I feel like things are showing signs of improvement. 01/13/18 on evaluation today patient appears to be even a little larger and deeper than last time. There still muscle exposed in the base of the wound. Nonetheless  he does appear to be less erythematous I do believe inflammation is calming down also believe the infection looks like it's probably resolved at this time based on what I'm seeing. No fevers, chills, nausea, or vomiting noted at this time. 01/30/18 on evaluation today patient actually appears to visually look better for the most part. Unfortunately those visually this looks better he does seem to potentially have what may be an abscess in the muscle that has been noted in the central portion of the wound. This is the first time that I have noted what appears to be fluctuance in the central portion of the muscle. With that being said I'm somewhat more concerned about the fact that this might indicate an abscess formation at this location. I do believe that an ultrasound would be appropriate. This is likely something we need to try to do as soon as possible. He has been switch to mupirocin ointment and he is no longer using the steroid ointment as prescribed by dermatology he sees them again next week he's been decreased from 60 to 40 mg of prednisone. 03/09/18 on evaluation today patient actually appears to be doing a little better compared to last time I saw him. There's not as much erythema surrounding the wound itself. He I did review his most recent infectious disease note which was dated 02/24/18. He saw Dr. Michel Bickers in [place]. 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 Patient is now off of antibiotics and they are continually observing this. There seems to be no change in the past two weeks in my pinion based on what the patient says and what I see today compared to what Dr. Megan Salon likely saw two weeks ago. No fevers, chills, nausea, or vomiting noted at this time. 03/23/18 on evaluation today patient's wound actually appears to be showing signs of improvement which is good news. He is currently  still on the Dapsone. He is also working on tapering the prednisone to get off of this and Dr. Lottie Rater is working with him in this regard. Nonetheless overall I feel like the wound is doing well it does appear based on the infectious disease note that I reviewed from Dr. 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 I did also review the results of his ultrasound of left lower extremity which revealed there was a dentist tissue in the base of the wound without an abscess noted. 04/06/18 on evaluation today the patient's left lateral lower extremity ulcer actually appears to be doing fairly well which is excellent news. There does not appear to be any evidence of infection at this time which is also great news. Overall he still does have a significantly large ulceration although little by little he seems to be making progress. He is down to 10 mg a day of the prednisone. 04/20/18 on evaluation today patient actually appears to be doing excellent at this time in regard to his left lower extremity ulcer. He's making signs of good progress unfortunately this is taking much longer than we would really like to see but nonetheless he is making progress. Fortunately there does not appear to be any evidence of infection at this time. No fevers, chills, nausea, or vomiting noted at this time. The patient has not been using the Santyl due to the cost he hadn't got in this field yet. He's mainly been using the antibiotic ointment topically. Subsequently he also tells me that he really has not been scrubbing in the shower I think this would be helpful again as I told him it doesn't have to be anything too aggressive to even make it believe just enough to keep it free of some of the loose slough and biofilm on the wound surface. 05/11/18 on evaluation today patient's wound appears to be making slow but  sure progress in regard to the left lateral lower extremity ulcer. He is been tolerating the dressing changes without complication. Fortunately there does not appear to be any evidence of infection at this time. He is still just using triple antibiotic ointment along with clobetasol occasionally over the area. He never got the Santyl and really does not seem to intend to in my pinion. 06/01/18 on evaluation today patient appears to be doing a little better in regard to his left lateral lower extremity ulcer. He states that overall he does not feel like he is doing as well with the Dapsone as he did with the prednisone. Nonetheless he sees his dermatologist later today and is gonna talk to them about the possibility of going back on the prednisone. Overall again I believe that the wound would be better if you would utilize Santyl but he really does not seem to be interested in going back to the [place]  at this point. He has been using triple antibiotic ointment. JERRID, FORGETTE ([id number]) 06/15/18 on evaluation today patient's wound actually appears to be doing about the same at this point. Fortunately there is no signs of infection at this time. He has made slight improvements although he continues to not really want to clean the wound bed at this point. He states that he just doesn't mess with it he doesn't want to cause any problems with everything else he has going on. He has been on medication, antibiotics as prescribed by his dermatologist, for a staff infection of his lower extremities which is really drying out now and looking much better he tells me. Fortunately there is no sign of overall infection. 06/29/18 on evaluation today patient appears to be doing well in regard to his left lateral lower surety ulcer all things considering. Fortunately his staff infection seems to be greatly improved compared to previous. He has no signs of infection and this is drying up quite nicely. He is still the  doxycycline for this is no longer on cental, Dapsone, or any of the other medications. His dermatologist has recommended possibility of an infusion but right now he does not want to proceed with that. 07/13/18 on evaluation today patient appears to be doing about the same in regard to his left lateral lower surety ulcer. Fortunately there's no signs of infection at this time which is great news. Unfortunately he still builds up a significant amount of Slough/biofilm of the surface of the wound he still is not really cleaning this as he should be appropriately. Again I'm able to easily with saline and gauze remove the majority of this on the surface which if you would do this at home would likely be a dramatic improvement for him as far as getting the area to improve. Nonetheless overall I still feel like he is making progress is just very slow. I think Santyl will be of benefit for him as well. Still he has not gotten this as of this point. 07/27/18 on evaluation today patient actually appears to be doing little worse in regards of the erythema around the periwound region of the wound he also tells me that he's been having more drainage currently compared to what he was experiencing last time I saw him. He states not quite as bad as what he had because this was infected previously but nonetheless is still appears to be doing poorly. Fortunately there is no evidence of systemic infection at this point. The patient tells me that he is not going to be able to afford the Santyl. He is still waiting to hear about the infusion therapy with his dermatologist. Apparently she wants an updated colonoscopy first. 08/10/18 on evaluation today patient appears to be doing better in regard to his left lateral lower extremity ulcer. Fortunately he is showing signs of improvement in this regard he's actually been approved for Remicade infusion's as well although this has not been scheduled as of yet. Fortunately there's  no signs of active infection at this time in regard to the wound although he is having some issues with infection of the right lower extremity is been seen as dermatologist for this. Fortunately they are definitely still working with him trying to keep things under control. 09/07/18 on evaluation today patient is actually doing rather well in regard to his left lateral lower extremity ulcer. He notes these actually having some hair grow back on his extremity which is something he has not seen in  years. He also tells me that the pain is really not giving them any trouble at this time which is also good news overall she is very pleased with the progress he's using a combination of the mupirocin along with the probate is all mixed. 09/21/18 on evaluation today patient actually appears to be doing fairly well all things considered in regard to his looks from the ulcer. He's been tolerating the dressing changes without complication. Fortunately there's no signs of active infection at this time which is good news he is still on all antibiotics or prevention of the staff infection. He has been on prednisone for time although he states it is gonna contact his dermatologist and see if she put them on a short course due to some irritation that he has going on currently. Fortunately there's no evidence of any overall worsening this is going very slow I think cental would be something that would be helpful for him although he states that $50 for tube is quite expensive. He therefore is not willing to get that at this point. 10/06/18 on evaluation today patient actually appears to be doing decently well in regard to his left lateral leg ulcer. He's been tolerating the dressing changes without complication. Fortunately there's no signs of active infection at this time. Overall I'm actually rather pleased with the progress he's making although it's slow he doesn't show any signs of infection and he does seem to be  making some improvement. I do believe that he may need a switch up and dressings to try to help this to heal more appropriately and quickly. 10/19/18 on evaluation today patient actually appears to be doing better in regard to his left lateral lower extremity ulcer. This is shown signs of having much less Slough buildup at this point due to the fact he has been using the [employer]. Obviously this is very good news. The overall size of the wound is not dramatically smaller but again the appearance is. 11/02/18 on evaluation today patient actually appears to be doing quite well in regard to his lower [place] ulcer. A lot of the skin around the ulcer is actually somewhat irritating at this point this seems to be more due to the dressing causing irritation from the adhesive that anything else. Fortunately there is no signs of active infection at this time. 11/24/18 on evaluation today patient appears to be doing a little worse in regard to his overall appearance of his lower extremity Sevey, Haynes ([id number]) ulcer. There's more erythema and warmth around the wound unfortunately. He is currently on doxycycline which he has been on for some time. With that being said I'm not sure that seems to be helping with what appears to possibly be an acute cellulitis with regard to his left lower extremity ulcer. No fevers, chills, nausea, or vomiting noted at this time. 12/08/18 on evaluation today patient's wounds actually appears to be doing significantly better compared to his last evaluation. He has been using Santyl along with alternating tripling about appointment as well as the steroid cream seems to be doing quite well and the wound is showing signs of improvement which is excellent news. Fortunately there's no evidence of infection and in fact his culture came back negative with only normal skin flora noted. 12/21/2018 upon evaluation today patient actually appears to be doing excellent with regard to his ulcer.  This is actually the best that I have seen it since have been helping to take care of him. It is both  smaller as well as less slough noted on the surface of the wound and seems to be showing signs of good improvement with new skin growing from the edges. He has been using just the triamcinolone he does wonder if he can get a refill of that ointment today. Patient History Information obtained from Patient. Family History Diabetes - Mother,Maternal Grandparents, Heart Disease - Mother,Maternal Grandparents, Hereditary Spherocytosis - Siblings, Hypertension - Maternal Grandparents, Stroke - Siblings, No family history of Cancer, Kidney Disease, Lung Disease, Seizures, Thyroid Problems, Tuberculosis. Social History Current some day smoker, Marital Status - Married, Alcohol Use - Rarely, Drug Use - No History, Caffeine Use - Moderate. Medical History Eyes Denies history of Cataracts, Glaucoma, Optic Neuritis Ear/Nose/Mouth/Throat Denies history of Chronic sinus problems/congestion, Middle ear problems Hematologic/Lymphatic Denies history of Anemia, Hemophilia, Human Immunodeficiency Virus, Lymphedema, Sickle Cell Disease Respiratory Patient has history of Sleep Apnea - cpap Denies history of Aspiration, Asthma, Chronic Obstructive Pulmonary Disease (COPD), Pneumothorax Cardiovascular Patient has history of Hypertension Denies history of Angina, Arrhythmia, Congestive Heart Failure, Coronary Artery Disease, Deep Vein Thrombosis, Hypotension, Myocardial Infarction, Peripheral Arterial Disease, Peripheral Venous Disease, Phlebitis, Vasculitis Gastrointestinal Patient has history of Colitis Endocrine Denies history of Type I Diabetes, Type II Diabetes Genitourinary Denies history of End Stage Renal Disease Immunological Denies history of Lupus Erythematosus, Raynaud s, Scleroderma Integumentary (Skin) Denies history of History of Burn, History of pressure wounds Musculoskeletal Denies  history of Gout, Rheumatoid Arthritis, Osteoarthritis, Osteomyelitis Neurologic Denies history of Dementia, Neuropathy, Quadriplegia, Paraplegia, Seizure Disorder Oncologic Denies history of Received Chemotherapy, Received Radiation Psychiatric Denies history of Anorexia/bulimia, Confinement Anxiety Review of Systems (ROS) Seever, Jester ([id number]) Constitutional Symptoms (General Health) Denies complaints or symptoms of Fatigue, Fever, Chills, Marked Weight Change. Respiratory Denies complaints or symptoms of Chronic or frequent coughs, Shortness of Breath. Cardiovascular Complains or has symptoms of LE edema. Denies complaints or symptoms of Chest pain. Psychiatric Denies complaints or symptoms of Anxiety, Claustrophobia. Objective Constitutional Obese and well-hydrated in no acute distress. Vitals Time Taken: 8:27 AM, Height: 71 in, Weight: 338 lbs, BMI: 47.1, Temperature: 98.2 F, Pulse: 77 bpm, Respiratory Rate: 18 breaths/min, Blood Pressure: 137/85 mmHg. Respiratory normal breathing without difficulty. clear to auscultation bilaterally. Cardiovascular regular rate and rhythm with normal S1, S2. Psychiatric this patient is able to make decisions and demonstrates good insight into disease process. Alert and Oriented x 3. pleasant and cooperative. General Notes: Upon inspection patient's wound bed showed signs of good granulation at this time there does not appear to be any evidence of active infection which is good news. No debridement was performed today although to be honest he really has minimal slough noted anyway and much more granulation I am very pleased with the overall appearance and I think that the triamcinolone has been beneficial for him. He quit using the Santyl as it was causing too much drainage though I do believe it cleared away a lot of the slough that we were dealing with here. Integumentary (Hair, Skin) Wound #1 status is Open. Original cause of wound  was Gradually Appeared. The wound is located on the Left,Lateral Lower Leg. The wound measures 2.9cm length x 1.6cm width x 0.2cm depth; 3.644cm^2 area and 0.729cm^3 volume. There is Fat Layer (Subcutaneous Tissue) Exposed exposed. There is no tunneling or undermining noted. There is a large amount of serous drainage noted. The wound margin is distinct with the outline attached to the wound base. There is medium (34-66%) red granulation within the wound bed. There  is a medium (34-66%) amount of necrotic tissue within the wound bed including Adherent Slough. 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 Anesthetic (add to Medication List): Wound #1 Left,Lateral Lower Leg: Topical Lidocaine 4% cream applied to wound bed prior to debridement (In Clinic Only). Primary Wound Dressing: Wound #1 Left,Lateral Lower Leg: Other: - TCA Ointment Secondary Dressing: Wound #1 Left,Lateral Lower Leg: Boardered Foam Dressing Dressing Change Frequency: Wound #1 Left,Lateral Lower Leg: Change dressing every day. Follow-up Appointments: Wound #1 Left,Lateral Lower Leg: Return Appointment in 2 weeks. Edema Control: Wound #1 Left,Lateral Lower Leg: Elevate legs to the level of the heart and pump ankles as often as possible Medications-please add to medication list.: Wound #1 Left,Lateral Lower Leg: Other: - TCA Ointment The following medication(s) was prescribed: triamcinolone acetonide topical 0.1 % ointment ointment topical applied to the wound bed and around the wound as directed. starting 12/21/2018 1. I am going to suggest that we switch completely to the triamcinolone ointment I am in a send him in a prescription today as well. 2. I am in a suggest as well that we go  ahead and continue with the bordered foam dressing this does cause irritation but unfortunately we have tried every other way to potentially wrap this wound in order to avoid use an adhesive unfortunately nothing seems to work. Nonetheless I think he can use some of the triamcinolone around the edge of the dressing where the skin is irritated as well. We will see patient back for reevaluation in 2 weeks here in the clinic. If anything worsens or changes patient will contact our office for additional recommendations. Electronic Signature(s) [name], GALLERY ([id number]) Signed: 12/21/2018 8:52:15 AM By: Worthy Keeler PA-C Entered By: Worthy Keeler on 12/21/2018 08:52:15 WORTH, KOBER ([id number]) -------------------------------------------------------------------------------- ROS/PFSH Details Patient Name: [name] Date of Service: 12/21/2018 8:15 AM Medical Record Number: [id number] Patient Account Number: [id number] Date of Birth/Sex: 04-06-1981 (37 y.o. M) Treating RN: Harold Barban Primary Care Provider: [name] Other Clinician: Referring Provider: [name] Treating Provider/Extender: Melburn Hake, Dorothia Passmore Weeks in Treatment: 106 Information Obtained From Patient Constitutional Symptoms (General Health) Complaints and Symptoms: Negative for: Fatigue; Fever; Chills; Marked Weight Change Respiratory Complaints and Symptoms: Negative for: Chronic or frequent coughs; Shortness of Breath Medical History: Positive for: Sleep Apnea - cpap Negative for: Aspiration; Asthma; Chronic Obstructive Pulmonary Disease (COPD); Pneumothorax Cardiovascular Complaints and Symptoms: Positive for: LE edema Negative for: Chest pain Medical History: Positive for: Hypertension Negative for: Angina; Arrhythmia; Congestive Heart Failure; Coronary Artery Disease; Deep Vein Thrombosis; Hypotension; Myocardial Infarction; Peripheral Arterial Disease; Peripheral Venous Disease; Phlebitis;  Vasculitis Psychiatric Complaints and Symptoms: Negative for: Anxiety; Claustrophobia Medical History: Negative for: Anorexia/bulimia; Confinement Anxiety Eyes Medical History: Negative for: Cataracts; Glaucoma; Optic Neuritis Ear/Nose/Mouth/Throat Medical History: Negative for: Chronic sinus problems/congestion; Middle ear problems Hematologic/Lymphatic Medical History: Negative for: Anemia; Hemophilia; Human Immunodeficiency Virus; Lymphedema; Sickle Cell Disease Rodier, Kyler ([id number]) Gastrointestinal Medical History: Positive for: Colitis Endocrine Medical History: Negative for: Type I Diabetes; Type II Diabetes Genitourinary Medical History: Negative for: End Stage Renal Disease Immunological Medical History: Negative for: Lupus Erythematosus; Raynaudos; Scleroderma Integumentary (Skin) Medical History: Negative for: History of Burn; History of pressure wounds Musculoskeletal Medical History: Negative for: Gout; Rheumatoid Arthritis; Osteoarthritis; Osteomyelitis Neurologic Medical History: Negative for: Dementia; Neuropathy; Quadriplegia; Paraplegia; Seizure Disorder Oncologic Medical  History: Negative for: Received Chemotherapy; Received Radiation Immunizations Pneumococcal Vaccine: Received Pneumococcal Vaccination: No Implantable Devices No devices added Family and Social History Cancer: No; Diabetes: Yes - Mother,Maternal Grandparents; Heart Disease: Yes - Mother,Maternal Grandparents; Hereditary Spherocytosis: Yes - Siblings; Hypertension: Yes - Maternal Grandparents; Kidney Disease: No; Lung Disease: No; Seizures: No; Stroke: Yes - Siblings; Thyroid Problems: No; Tuberculosis: No; Current some day smoker; Marital Status - Married; Alcohol Use: Rarely; Drug Use: No History; Caffeine Use: Moderate; Financial Concerns: No; Food, Clothing or Shelter Needs: No; Support System Lacking: No; Transportation Concerns: No Physician Affirmation I have reviewed  and agree with the above information. Electronic Signature(s) SHAARAV, RIPPLE ([id number]) Signed: 12/21/2018 4:11:30 PM By: Harold Barban Signed: 12/21/2018 6:32:30 PM By: Worthy Keeler PA-C Entered By: Worthy Keeler on 12/21/2018 08:49:15 DARELL, SAPUTO ([id number]) -------------------------------------------------------------------------------- SuperBill Details Patient Name: [name] Date of Service: 12/21/2018 Medical Record Number: [id number] Patient Account Number: [id number] Date of Birth/Sex: [birth year] (37 y.o. 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

## 2018-12-21 NOTE — Telephone Encounter (Signed)
Pt notified that he has severe OSA and it is recommended that he [place] cpap with pressure range 10-20 cm H2O and avoid sleeping in the supine position. Pt aware that he needs to be seen within 31-90 days of starting cpap and will call for appointment once he gets his machine.Pt verbalized understanding. Order has been placed. No further actions necessary at this time.

## 2018-12-22 ENCOUNTER — Other Ambulatory Visit: Payer: Self-pay

## 2018-12-22 DIAGNOSIS — F3342 Major depressive disorder, recurrent, in full remission: Secondary | ICD-10-CM

## 2018-12-22 MED ORDER — VENLAFAXINE HCL ER 150 MG PO CP24: ORAL_CAPSULE | ORAL | 1 refills | Status: DC

## 2018-12-29 ENCOUNTER — Ambulatory Visit: Payer: 59 | Admitting: Internal Medicine

## 2018-12-29 ENCOUNTER — Ambulatory Visit
Admit: 2018-12-29 | Discharge: 2018-12-30 | Payer: PRIVATE HEALTH INSURANCE | Attending: Physician Assistant | Primary: Physician Assistant

## 2018-12-29 DIAGNOSIS — L88 Pyoderma gangrenosum: Principal | ICD-10-CM

## 2018-12-29 DIAGNOSIS — I872 Venous insufficiency (chronic) (peripheral): Secondary | ICD-10-CM

## 2018-12-29 DIAGNOSIS — L98499 Non-pressure chronic ulcer of skin of other sites with unspecified severity: Secondary | ICD-10-CM

## 2018-12-29 DIAGNOSIS — L820 Inflamed seborrheic keratosis: Secondary | ICD-10-CM

## 2018-12-29 MED ORDER — DOXYCYCLINE HYCLATE 100 MG TABLET/CAPSULE WRAPPER
Freq: Two times a day (BID) | ORAL | 1 refills | 30.00000 days | Status: CP
Start: 2018-12-29 — End: ?

## 2018-12-29 NOTE — Unmapped (Signed)
Cryosurgery  Cryosurgery (“freezing”) uses liquid nitrogen to destroy certain types of skin lesions. Lowering the temperature of the lesion in a small area surrounding skin destroys the lesion. Immediately following cryosurgery, you will notice redness and swelling of the treatment area. Blistering or weeping may occur, lasting approximately one week which will then be followed by crusting. Most areas will heal completely in 10 to 14 days.    Wash the treated areas daily. Allow soap and water to run over the areas, but do not scrub. Should a scab or crust form, allow it to fall off on its own. Do not remove or pick at it. Application of an ointment  and a bandage may make you feel more comfortable, but it is not necessary. Some people develop an allergy to Neosporin, so we recommend that Vaseline or  Aquaphor be used.    The cryotherapy site will be more sensitive than your surrounding skin. Keep it covered, and remember to apply sunscreen every day to all your sun exposed skin. A scar may remain which is lighter or pinker than your normal skin. Your body will continue to improve your scar for up to one year; however a light-colored scar may remain.    Infection following cryotherapy is rare. However if you are worried about the appearance of the treated area, contact your doctor. We have a physician on call at all times. If you have any concerns about the site, please call our clinic at [phone]

## 2019-01-04 ENCOUNTER — Encounter: Payer: 59 | Admitting: Physician Assistant

## 2019-01-04 ENCOUNTER — Other Ambulatory Visit: Payer: Self-pay

## 2019-01-04 DIAGNOSIS — L97222 Non-pressure chronic ulcer of left calf with fat layer exposed: Secondary | ICD-10-CM | POA: Diagnosis not present

## 2019-01-04 NOTE — Progress Notes (Addendum)
DAYN, BARICH ([id number]) Visit Report for 01/04/2019 Chief Complaint Document Details Patient Name: [name], [name] Date of Service: 01/04/2019 8:15 AM Medical Record Number: [id number] Patient Account Number: [id number] Date of Birth/Sex: 21-Sep-1980 (38 y.o. M) Treating RN: Harold Barban Primary Care Provider: [name] Other Clinician: Referring Provider: [name] Treating Provider/Extender: Melburn Hake, Charis Juliana Weeks in Treatment: 108 Information Obtained from: Patient Chief Complaint He is here in follow up evaluation for LLE pyoderma ulcer Electronic Signature(s) Signed: 01/04/2019 8:26:30 AM By: Worthy Keeler PA-C Entered By: Worthy Keeler on 01/04/2019 08:26:30 PARIS, CHIRIBOGA ([id number]) -------------------------------------------------------------------------------- HPI Details Patient Name: [name] Date of Service: 01/04/2019 8:15 AM Medical Record Number: [id number] Patient Account Number: [id number] Date of Birth/Sex: 1980/10/01 (37 y.o. M) Treating RN: Harold Barban Primary Care Provider: [name] Other Clinician: Referring Provider: [name] Treating Provider/Extender: Melburn Hake, Robecca Fulgham Weeks in Treatment: 108 History of Present Illness HPI Description: 12/04/16; 38 year old man who comes into the clinic today for review of a wound on the posterior left calf. He tells me that is been there for about a year. He is not a diabetic he does smoke half a pack per day. He was seen in the ER on 11/20/16 felt to have cellulitis around the wound and was given clindamycin. An x-ray did not show osteomyelitis. The patient initially tells me that he has a milk allergy that sets off a pruritic itching rash on his lower legs which she scratches incessantly and he thinks that's what may have set up the wound. He has been using various topical antibiotics and ointments without any effect. He works in a [employer] and is on his feet all day. He does not have a  prior history of wounds however he does have the rash on both lower legs the right arm and the ventral aspect of his left arm. These are excoriations and clearly have had scratching however there are of macular looking areas on both legs including a substantial larger area on the right leg. This does not have an underlying open area. There is no blistering. The patient tells me that 2 years ago in [place] in response to the rash on his legs he saw a dermatologist who told him he had a condition which may be pyoderma gangrenosum although I may be putting words into his mouth. He seemed to recognize this. 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 He is not in any treatment for this. He's had no recent travel 12/11/16; the patient arrives today with his wound and roughly the same condition we've been using silver alginate this is a deep punched out wound with some surrounding erythema but no tenderness. Biopsy I did did not show confirmed pyoderma gangrenosum suggested nonspecific inflammation and vasculitis but does not provide an actual description of what was seen by the pathologist. I'm really not able to understand this We have also received information from the patient's dermatologist in [place] notes from April 2016. This was a doctor Agarwal- antal. 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 He was prescribed topical steroid high potency under occlusion which helped but at this point the patient did not have a deep punched out wound. 12/18/16; the patient's wound is larger in terms of surface area however this surface looks better and there is less depth. The surrounding erythema also is better. The patient states that the wrap we put on came off 2 days ago when he has been using  his compression stockings. He we are in the process of getting a dermatology consult. 12/26/16 on evaluation today patient's left lower extremity wound shows evidence  of infection with surrounding erythema noted. He has been tolerating the dressing changes but states that he has noted more discomfort. There is a larger area of erythema surrounding the wound. No fevers, chills, nausea, or vomiting noted at this time. With that being said the wound still does have slough covering the surface. He is not allergic to any medication that he is aware of at this point. In regard to his right lower extremity he had several regions that are erythematous and pruritic he wonders if there's anything we can do to help that. 01/02/17 I reviewed patient's wound culture which was obtained his visit last week. He was placed on doxycycline at that point. Unfortunately that does not appear to be an antibiotic that would likely help with the situation however the pseudomonas noted on culture is sensitive to Cipro. Also unfortunately patient's wound seems to have a large compared to last week's evaluation. Not severely so but there are definitely increased measurements in general. He is continuing to have discomfort as well he writes this to be a seven out of 10. In fact he would prefer me not to perform any debridement today due to the fact that he is having discomfort and considering he has an active infection on the little reluctant to do so anyway. No fevers, chills, nausea, or vomiting noted at this time. 01/08/17; patient seems dermatology on September 5. I suspect dermatology will want the slides from the biopsy I did sent to their pathologist. I'm not sure if there is a way we can expedite that. In any case the culture I did before I left on vacation 3 weeks ago showed Pseudomonas he was given 10 days of Cipro and per her description of her intake nurses is actually somewhat better this week although the wound is quite a bit bigger than I remember the last time I saw this. He still has 3 more days of Cipro YOON, BARCA ([id number]) 01/21/17; dermatology appointment tomorrow. He  has completed the ciprofloxacin for Pseudomonas. Surface of the wound looks better however he is had some deterioration in the lesions on his right leg. Meantime the left lateral leg wound we will continue with sample 01/29/17; patient had his dermatology appointment but I can't yet see that note. He is completed his antibiotics. The wound is more superficial but considerably larger in circumferential area than when he came in. This is in his left lateral calf. He also has swollen erythematous areas with superficial wounds on the right leg and small papular areas on both arms. There apparently areas in her his upper thighs and buttocks I did not look at those. Dermatology biopsied the right leg. Hopefully will have their input next week. 02/05/17; patient went back to see his dermatologist who told him that he had a "scratching problem" as well as staph. He is now on a 30 day course of doxycycline and I believe she gave him triamcinolone cream to the right leg areas to help with the itching [not exactly sure but probably triamcinolone]. She apparently looked at the left lateral leg wound although this was not rebiopsied and I think felt to be ultimately part of the same pathogenesis. He is using sample border foam and changing nevus himself. He now has a new open area on the right posterior leg which was his biopsy site I don't  have any of the dermatology notes 02/12/17; we put the patient in compression last week with SANTYL to the wound on the left leg and the biopsy. Edema is much better and the depth of the wound is now at level of skin. Area is still the same oBiopsy site on the right lateral leg we've also been using santyl with a border foam dressing and he is changing this himself. 02/19/17; Using silver alginate started last week to both the substantial left leg wound and the biopsy site on the right wound. He is tolerating compression well. Has a an appointment with his primary M.D. tomorrow  wondering about diuretics although I'm wondering if the edema problem is actually lymphedema 02/26/17; the patient has been to see his primary doctor Dr. Jerrel Ivory at [place] our primary care. She started him on Lasix 20 mg and this seems to have helped with the edema. However we are not making substantial change with the left lateral calf wound and inflammation. The biopsy site on the right leg also looks stable but not really all that different. 03/12/17; the patient has been to see vein and vascular Dr. Lucky Cowboy. He has had venous reflux studies I have not reviewed these. I did get a call from his dermatology office. 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 They therefore recommended clobetasol and tetracycline i.e. 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 03/26/17; apparently the patient just had reflux studies not an appointment with Dr. dew. She arrives in clinic today having applied clobetasol for 2-3 weeks. He notes over the last 2-3 days excessive drainage having to change the dressing 3-4 times a day and also expanding erythema. 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 Lillia Carmel at [facility] dermatology locally who kindly saw him at my request. 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 My concern was the degree of erythema medially extending from his left leg wound which was either inflammation from  pyoderma or cellulitis. I put him on Augmentin however culture of the wound showed Pseudomonas which is quinolone sensitive. I really don't believe he has cellulitis however in view of everything I will continue and give him a course of Cipro. He is also on doxycycline as an immune modulator for the pyoderma. In addition to his original wound on the left lateral leg with surrounding erythema he has a wound on the right posterior calf which was an original biopsy site done by dermatology. 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 Saw Dr. Lillia Carmel yesterday. He has been using topical antibiotics to both wound areas his original wound on the left and the biopsies/pathergy area on the right. There is definitely some improvement in the inflammation around the wound on the right although the patient states he has increasing sensitivity of the wounds. He is on prednisone 60 and doxycycline 1 as prescribed by Dr. Lillia Carmel. He is covering the topical  antibiotic with gauze and putting this in his own compression stocks and changing this daily. He states that Dr. Lottie Rater did a culture of the left leg wound yesterday 05/07/17; pyoderma gangrenosum. The patient saw Dr. Lillia Carmel yesterday and has a follow-up with her in one month. He is still using topical antibiotics to both wounds although he can't recall exactly what type. He is still on prednisone 60 mg. Dr. Lillia Carmel stated that the doxycycline could stop if we were in agreement. 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 The right medial leg was induced by biopsy/pathergy. The area on the right is essentially healed. 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 The right medial leg has closed and  remains closed. He is still on prednisone 60. oHe tells me he missed his last dermatology appointment with Dr. Lillia Carmel but will make another appointment. He reports that her blood sugar at a recent screen in [place] was high 200's. He was 180 today. He is more cushingoid blood pressure is Dicke, Tynan ([id number]) up a bit. I think he is going to require still much longer prednisone perhaps another 3 months before attempting to taper. In the meantime his wound is a lot better. Smaller. He is cleaning this off daily and applying topical antibiotics. When he was last in the clinic I thought about changing to [facility] and actually put in a couple of calls to dermatology although probably not during their business hours. 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 He continues on oral prednisone. He has been using triple antibiotic ointment. There is surface debris and we will transition to [facility] and have him return in 2 weeks. He has lost 30 pounds since his last appointment with lifestyle modification. He may benefit from topical steroid cream for treatment this can be considered at a later date. 08/22/17 on evaluation today patient appears to actually be doing rather well in regard to his left lateral lower extremity ulcer. He has actually been managed by Dr. Dellia Nims most recently. Patient is currently on oral steroids at this time. This seems to have been of benefit for him. Nonetheless his last visit was actually with Leah on 08/06/17. Currently he is not utilizing any topical steroid creams although this could be of benefit as well. No fevers, chills, nausea, or vomiting noted at this time. 09/05/17 on evaluation today patient appears to be doing better in regard to his left lateral lower extremity ulcer. He has been tolerating the dressing changes without complication.  He is using Santyl with good effect. Overall I'm very pleased with how things are standing at this point. Patient likewise is happy that this is doing better. 09/19/17 on evaluation today patient actually appears to be doing rather well in regard to his left lateral lower extremity ulcer. 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 He's not having any significant pain. 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 He has been tolerating the Santyl without complication and in general I feel like he's making good  progress. 10/17/17 on evaluation today patient appears to be doing very well in regard to his left lateral lower surety ulcer. He has been tolerating the dressing changes without complication. There does not appear to be any evidence of infection he's alternating the Santyl and the triple antibiotic ointment every other day this seems to be doing well for him. 11/03/17 on evaluation today patient appears to be doing very well in regard to his left lateral lower extremity ulcer. He is been tolerating the dressing changes without complication which is good news. Fortunately there does not appear to be any evidence of infection which is also great news. Overall is doing excellent they are starting to taper down on the prednisone is down to 40 mg at this point it also started topical clobetasol for him. 11/17/17 on evaluation today patient appears to be doing well in regard to his left lateral lower surety ulcer. He's been tolerating the dressing changes without complication. He does note that he is having no pain, no excessive drainage or discharge, and overall he feels like things are going about how he would expect and hope they would. Overall he seems to have no evidence of infection at this time in my opinion which is  good news. 12/04/17-He is seen in follow-up evaluation for right lateral lower extremity ulcer. He has been applying topical steroid cream. Today's measurement show slight increase in size. Over the next 2 weeks we will transition to every other day Santyl and steroid cream. He has been encouraged to monitor for changes and notify clinic with any concerns 12/15/17 on evaluation today patient's left lateral motion the ulcer and fortunately is doing worse again at this point. This just since last week to this week has close to doubled in size according to the patient. I did not seeing last week's I do not have a visual to compare this to in our system was also down so we do not have all the charts and at this point. Nonetheless it does have me somewhat concerned in regard to the fact that again he was worried enough about it he has contact the dermatology that placed them back on the full strength, 50 mg a day of the prednisone that he was taken previous. He continues to alternate using clobetasol along with Santyl at this point. He is obviously somewhat frustrated. 12/22/17 on evaluation today patient appears to be doing a little worse compared to last evaluation. Unfortunately the wound is a little deeper and slightly larger than the last week's evaluation. With that being said he has made some progress in regard to the irritation surrounding at this time unfortunately despite that progress that's been made he still has a significant issue going on here. 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 For that reason I discussed with him today the possibility of perform a wound culture to ensure there's nothing overtly infected. 01/06/18 on evaluation today patient's wound is larger and deeper than previously evaluated. With that being said it did appear that  his wound was infected after my last evaluation with him. Subsequently I did end up prescribing a prescription for Bactrim DS which she has been taking and having no complication with. Fortunately there does not appear to be any evidence of Limas, Winifred ([id number]) infection at this point in time as far as anything spreading, no want to  touch, and overall I feel like things are showing signs of improvement. 01/13/18 on evaluation today patient appears to be even a little larger and deeper than last time. There still muscle exposed in the base of the wound. Nonetheless he does appear to be less erythematous I do believe inflammation is calming down also believe the infection looks like it's probably resolved at this time based on what I'm seeing. No fevers, chills, nausea, or vomiting noted at this time. 01/30/18 on evaluation today patient actually appears to visually look better for the most part. Unfortunately those visually this looks better he does seem to potentially have what may be an abscess in the muscle that has been noted in the central portion of the wound. This is the first time that I have noted what appears to be fluctuance in the central portion of the muscle. With that being said I'm somewhat more concerned about the fact that this might indicate an abscess formation at this location. I do believe that an ultrasound would be appropriate. This is likely something we need to try to do as soon as possible. He has been switch to mupirocin ointment and he is no longer using the steroid ointment as prescribed by dermatology he sees them again next week he's been decreased from 60 to 40 mg of prednisone. 03/09/18 on evaluation today patient actually appears to be doing a little better compared to last time I saw him. There's not as much erythema surrounding the wound itself. He I did review his most recent infectious disease note which was dated 02/24/18. He saw Dr. Michel Bickers in  [place]. 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 Patient is now off of antibiotics and they are continually observing this. There seems to be no change in the past two weeks in my pinion based on what the patient says and what I see today compared to what Dr. Megan Salon likely saw two weeks ago. No fevers, chills, nausea, or vomiting noted at this time. 03/23/18 on evaluation today patient's wound actually appears to be showing signs of improvement which is good news. He is currently still on the Dapsone. He is also working on tapering the prednisone to get off of this and Dr. Lottie Rater is working with him in this regard. Nonetheless overall I feel like the wound is doing well it does appear based on the infectious disease note that I reviewed from Dr. 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 I did also review the results of his ultrasound of left lower extremity which revealed there was a dentist tissue in the base of the wound without an abscess noted. 04/06/18 on evaluation today the patient's left lateral lower extremity ulcer actually appears to be doing fairly well which is excellent news. There does not appear to be any evidence of infection at this time which is also great news. Overall he still does have a significantly large ulceration although little by little he seems to be making progress. He is down to 10 mg a day of the prednisone. 04/20/18 on evaluation today patient actually appears to be doing excellent at this time in regard to his left lower extremity ulcer. He's making signs of good progress unfortunately this is taking much longer than we would really like  to see but nonetheless he is making progress. Fortunately there does not appear to be any evidence of infection at this  time. No fevers, chills, nausea, or vomiting noted at this time. The patient has not been using the Santyl due to the cost he hadn't got in this field yet. He's mainly been using the antibiotic ointment topically. Subsequently he also tells me that he really has not been scrubbing in the shower I think this would be helpful again as I told him it doesn't have to be anything too aggressive to even make it believe just enough to keep it free of some of the loose slough and biofilm on the wound surface. 05/11/18 on evaluation today patient's wound appears to be making slow but sure progress in regard to the left lateral lower extremity ulcer. He is been tolerating the dressing changes without complication. Fortunately there does not appear to be any evidence of infection at this time. He is still just using triple antibiotic ointment along with clobetasol occasionally over the area. He never got the Santyl and really does not seem to intend to in my pinion. 06/01/18 on evaluation today patient appears to be doing a little better in regard to his left lateral lower extremity ulcer. He states that overall he does not feel like he is doing as well with the Dapsone as he did with the prednisone. Nonetheless he sees his dermatologist later today and is gonna talk to them about the possibility of going back on the prednisone. Overall again I believe that the wound would be better if you would utilize Santyl but he really does not seem to be interested in going back to the [place] at this point. He has been using triple antibiotic ointment. 06/15/18 on evaluation today patient's wound actually appears to be doing about the same at this point. Fortunately there is no signs of infection at this time. He has made slight improvements although he continues to not really want to clean the wound bed at this point. He states that he just doesn't mess with it he doesn't want to cause any problems with everything else  he has going on. He has been on medication, antibiotics as prescribed by his dermatologist, for a staff infection of his lower extremities which is really drying out now and looking much better he tells me. Fortunately there is no sign of overall infection. ASIR, BINGLEY ([id number]) 06/29/18 on evaluation today patient appears to be doing well in regard to his left lateral lower surety ulcer all things considering. Fortunately his staff infection seems to be greatly improved compared to previous. He has no signs of infection and this is drying up quite nicely. He is still the doxycycline for this is no longer on cental, Dapsone, or any of the other medications. His dermatologist has recommended possibility of an infusion but right now he does not want to proceed with that. 07/13/18 on evaluation today patient appears to be doing about the same in regard to his left lateral lower surety ulcer. Fortunately there's no signs of infection at this time which is great news. Unfortunately he still builds up a significant amount of Slough/biofilm of the surface of the wound he still is not really cleaning this as he should be appropriately. Again I'm able to easily with saline and gauze remove the majority of this on the surface which if you would do this at home would likely be a dramatic improvement for him as far as getting the area  to improve. Nonetheless overall I still feel like he is making progress is just very slow. I think Santyl will be of benefit for him as well. Still he has not gotten this as of this point. 07/27/18 on evaluation today patient actually appears to be doing little worse in regards of the erythema around the periwound region of the wound he also tells me that he's been having more drainage currently compared to what he was experiencing last time I saw him. He states not quite as bad as what he had because this was infected previously but nonetheless is still appears to be doing  poorly. Fortunately there is no evidence of systemic infection at this point. The patient tells me that he is not going to be able to afford the Santyl. He is still waiting to hear about the infusion therapy with his dermatologist. Apparently she wants an updated colonoscopy first. 08/10/18 on evaluation today patient appears to be doing better in regard to his left lateral lower extremity ulcer. Fortunately he is showing signs of improvement in this regard he's actually been approved for Remicade infusion's as well although this has not been scheduled as of yet. Fortunately there's no signs of active infection at this time in regard to the wound although he is having some issues with infection of the right lower extremity is been seen as dermatologist for this. Fortunately they are definitely still working with him trying to keep things under control. 09/07/18 on evaluation today patient is actually doing rather well in regard to his left lateral lower extremity ulcer. He notes these actually having some hair grow back on his extremity which is something he has not seen in years. He also tells me that the pain is really not giving them any trouble at this time which is also good news overall she is very pleased with the progress he's using a combination of the mupirocin along with the probate is all mixed. 09/21/18 on evaluation today patient actually appears to be doing fairly well all things considered in regard to his looks from the ulcer. He's been tolerating the dressing changes without complication. Fortunately there's no signs of active infection at this time which is good news he is still on all antibiotics or prevention of the staff infection. He has been on prednisone for time although he states it is gonna contact his dermatologist and see if she put them on a short course due to some irritation that he has going on currently. Fortunately there's no evidence of any overall worsening this is  going very slow I think cental would be something that would be helpful for him although he states that $50 for tube is quite expensive. He therefore is not willing to get that at this point. 10/06/18 on evaluation today patient actually appears to be doing decently well in regard to his left lateral leg ulcer. He's been tolerating the dressing changes without complication. Fortunately there's no signs of active infection at this time. Overall I'm actually rather pleased with the progress he's making although it's slow he doesn't show any signs of infection and he does seem to be making some improvement. I do believe that he may need a switch up and dressings to try to help this to heal more appropriately and quickly. 10/19/18 on evaluation today patient actually appears to be doing better in regard to his left lateral lower extremity ulcer. This is shown signs of having much less Slough buildup at this point due to  the fact he has been using the Santyl. Obviously this is very good news. The overall size of the wound is not dramatically smaller but again the appearance is. 11/02/18 on evaluation today patient actually appears to be doing quite well in regard to his lower [place] ulcer. A lot of the skin around the ulcer is actually somewhat irritating at this point this seems to be more due to the dressing causing irritation from the adhesive that anything else. Fortunately there is no signs of active infection at this time. 11/24/18 on evaluation today patient appears to be doing a little worse in regard to his overall appearance of his lower extremity ulcer. There's more erythema and warmth around the wound unfortunately. He is currently on doxycycline which he has been on for some time. With that being said I'm not sure that seems to be helping with what appears to possibly be an acute cellulitis with regard to his left lower extremity ulcer. No fevers, chills, nausea, or vomiting noted at this  time. 12/08/18 on evaluation today patient's wounds actually appears to be doing significantly better compared to his last evaluation. He has been using Santyl along with alternating tripling about appointment as well as the steroid cream seems to be doing Wojtas, Brexton ([id number]) quite well and the wound is showing signs of improvement which is excellent news. Fortunately there's no evidence of infection and in fact his culture came back negative with only normal skin flora noted. 12/21/2018 upon evaluation today patient actually appears to be doing excellent with regard to his ulcer. This is actually the best that I have seen it since have been helping to take care of him. It is both smaller as well as less slough noted on the surface of the wound and seems to be showing signs of good improvement with new skin growing from the edges. He has been using just the triamcinolone he does wonder if he can get a refill of that ointment today. 01/04/2019 upon evaluation today patient actually appears to be doing well with regard to his left lateral lower extremity ulcer. With that being said it does not appear to be that he is doing quite as well as last time as far as progression is concerned. There does not appear to be any signs of infection or significant irritation which is good news. With that being said I do believe that he may benefit from switching to a collagen based dressing based on how clean The wound appears. Electronic Signature(s) Signed: 01/04/2019 8:54:39 AM By: Worthy Keeler PA-C Entered By: Worthy Keeler on 01/04/2019 08:54:39 PATTRICK, BADY ([id number]) -------------------------------------------------------------------------------- Physical Exam Details Patient Name: [name] Date of Service: 01/04/2019 8:15 AM Medical Record Number: [id number] Patient Account Number: [id number] Date of Birth/Sex: 1980-05-22 (37 y.o. M) Treating RN: Harold Barban Primary Care Provider: [name] Other Clinician: Referring Provider: [name] Treating Provider/Extender: Melburn Hake, Matea Stanard Weeks in Treatment: 57 Constitutional Well-nourished and well-hydrated in no acute distress. Respiratory normal breathing without difficulty. clear to auscultation bilaterally. Cardiovascular regular rate and rhythm with normal S1, S2. Psychiatric this patient is able to make decisions and demonstrates good insight into disease process. Alert and Oriented x 3. pleasant and cooperative. Notes Patient's wound bed upon evaluation today actually appears to have a fairly good granulation surface although there is a lot of scar tissue just from the frequent healing/worsening cycle that he experienced with this wound. Fortunately there is no signs of active infection at this time which  is good news. No fevers, chills, nausea, vomiting, or diarrhea. No sharp debridement was performed at this time. Electronic Signature(s) Signed: 01/04/2019 8:55:26 AM By: Worthy Keeler PA-C Entered By: Worthy Keeler on 01/04/2019 08:55:26 VYNCENT, OVERBY ([id number]) -------------------------------------------------------------------------------- Physician Orders Details Patient Name: [name] Date of Service: 01/04/2019 8:15 AM Medical Record Number: [id number] Patient Account Number: [id number] Date of Birth/Sex: 10-Sep-1980 (37 y.o. 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 Anesthetic (add to Medication List) Wound #1 Left,Lateral Lower  Leg o Topical Lidocaine 4% cream applied to wound bed prior to debridement (In Clinic Only). Primary Wound Dressing Wound #1 Left,Lateral Lower Leg o Collagen Secondary Dressing Wound #1 Left,Lateral Lower Leg o Boardered Foam Dressing Dressing Change Frequency Wound #1 Left,Lateral Lower Leg o Change dressing every other day. Follow-up Appointments Wound #1 Left,Lateral Lower Leg o Return Appointment in 2 weeks. Edema Control Wound #1 Left,Lateral Lower Leg o Elevate legs to the level of the heart and pump ankles as often as possible Electronic Signature(s) Signed: 01/04/2019 12:05:39 PM By: Worthy Keeler PA-C Signed: 01/04/2019 4:31:36 PM By: Harold Barban Entered By: Harold Barban on 01/04/2019 08:51:23 TAMIM, SKOG ([id number]) MAEL, DELAP ([id number]) -------------------------------------------------------------------------------- Problem List Details Patient Name: [name] Date of Service: 01/04/2019 8:15 AM Medical Record Number: [id number] Patient Account Number: [id number] Date of Birth/Sex: 12/25/1980 (37 y.o. M) Treating RN: Harold Barban Primary Care Provider: [name] Other Clinician: Referring Provider: [name] Treating Provider/Extender: Melburn Hake, Chimene Salo Weeks in Treatment: 108 Active Problems ICD-10 Evaluated Encounter Code Description Active Date Today Diagnosis L97.222 Non-pressure chronic ulcer of left calf with fat layer exposed 12/04/2016 No Yes L88 Pyoderma gangrenosum 03/26/2017 No Yes I87.2 Venous insufficiency (chronic) (peripheral) 12/04/2016 No Yes Inactive Problems ICD-10 Code Description Active Date Inactive Date L97.213 Non-pressure chronic ulcer of right calf with necrosis of muscle 04/02/2017 04/02/2017 Resolved Problems Electronic Signature(s) Signed: 01/04/2019 8:26:19 AM By: Worthy Keeler PA-C Entered By: Worthy Keeler on 01/04/2019 08:26:19 [name]  ([id number]) -------------------------------------------------------------------------------- Progress Note Details Patient Name: [name] Date of Service: 01/04/2019 8:15 AM Medical Record Number: [id number] Patient Account Number: [id number] Date of Birth/Sex: [birth year] (37 y.o. M) Treating RN: Harold Barban Primary Care Provider: [name] Other Clinician: Referring Provider: [name] Treating Provider/Extender: Melburn Hake, Luci Bellucci Weeks in Treatment: 108 Subjective Chief Complaint Information obtained from Patient He is here in follow up evaluation for LLE pyoderma ulcer History of Present Illness (HPI) 12/04/16; 38 year old man who comes into the clinic today for review of a wound on the posterior left calf. He tells me that is been there for about a year. He is not a diabetic he does smoke half a pack per day. He was seen in the ER on 11/20/16 felt to have cellulitis around the wound and was given clindamycin. An x-ray did not show osteomyelitis. The patient initially tells me that he has a milk allergy that sets off a pruritic itching rash on his lower legs which she scratches incessantly and he thinks that's what may have set up the wound. He has been using various topical antibiotics and ointments without any effect. He works in a  [employer] and is on his feet all day. He does not have a prior history of wounds however he does have the rash on both lower legs the right arm and the ventral aspect of his left arm. These are excoriations and clearly have had scratching however there are of macular looking areas on both legs including a substantial larger area on the right leg. This does not have an underlying open area. There is no blistering. The patient tells me that 2 years ago in [place] in response to the rash on his legs he saw a dermatologist who told him he had a condition which may be pyoderma gangrenosum although I may be putting words into his mouth. He seemed  to recognize this. 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 He is not in any treatment for this. He's had no recent travel 12/11/16; the patient arrives today with his wound and roughly the same condition we've been using silver alginate this is a deep punched out wound with some surrounding erythema but no tenderness. Biopsy I did did not show confirmed pyoderma gangrenosum suggested nonspecific inflammation and vasculitis but does not provide an actual description of what was seen by the pathologist. I'm really not able to understand this We have also received information from the patient's dermatologist in [place] notes from April 2016. This was a doctor Agarwal- antal. 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 He was prescribed topical steroid high potency under occlusion which helped but at this point the patient did not have a deep punched out wound. 12/18/16; the patient's wound is larger in terms of surface area however this surface looks better and there is less depth. The surrounding erythema also is better. The patient states that the wrap we put on came off 2 days ago when he has been using his compression stockings. He we are in the process of getting a dermatology consult. 12/26/16 on evaluation today patient's left lower extremity wound shows evidence of infection with surrounding erythema noted. He has been tolerating the dressing changes but states that he has noted more discomfort. There is a larger area of erythema surrounding the wound. No fevers, chills, nausea, or vomiting noted at this time. With that being said the wound still does have slough covering the surface. He is not allergic to any medication that he is aware of at this point. In regard to his right lower extremity he had several regions that are erythematous and pruritic he wonders if there's anything we can do to help that. 01/02/17 I reviewed patient's wound  culture which was obtained his visit last week. He was placed on doxycycline at that point. Unfortunately that does not appear to be an antibiotic that would likely help with the situation however the pseudomonas noted on culture is sensitive to Cipro. Also unfortunately patient's wound seems to have a large compared to last week's evaluation. Not severely so but there are definitely increased measurements in general. He is continuing to have discomfort as well he writes this to be a seven out of 10. In fact he would prefer me not to perform any debridement today due to the fact that he is having discomfort and considering he has an active infection on the little reluctant to do so anyway. No fevers, [place]lemyre, Knowledge ([id number]) chills, nausea, or vomiting noted at this time. 01/08/17; patient seems dermatology on September 5. I suspect dermatology will want the slides from the biopsy  I did sent to their pathologist. I'm not sure if there is a way we can expedite that. In any case the culture I did before I left on vacation 3 weeks ago showed Pseudomonas he was given 10 days of Cipro and per her description of her intake nurses is actually somewhat better this week although the wound is quite a bit bigger than I remember the last time I saw this. He still has 3 more days of Cipro 01/21/17; dermatology appointment tomorrow. He has completed the ciprofloxacin for Pseudomonas. Surface of the wound looks better however he is had some deterioration in the lesions on his right leg. Meantime the left lateral leg wound we will continue with sample 01/29/17; patient had his dermatology appointment but I can't yet see that note. He is completed his antibiotics. The wound is more superficial but considerably larger in circumferential area than when he came in. This is in his left lateral calf. He also has swollen erythematous areas with superficial wounds on the right leg and small papular areas on both arms.  There apparently areas in her his upper thighs and buttocks I did not look at those. Dermatology biopsied the right leg. Hopefully will have their input next week. 02/05/17; patient went back to see his dermatologist who told him that he had a "scratching problem" as well as staph. He is now on a 30 day course of doxycycline and I believe she gave him triamcinolone cream to the right leg areas to help with the itching [not exactly sure but probably triamcinolone]. She apparently looked at the left lateral leg wound although this was not rebiopsied and I think felt to be ultimately part of the same pathogenesis. He is using sample border foam and changing nevus himself. He now has a new open area on the right posterior leg which was his biopsy site I don't have any of the dermatology notes 02/12/17; we put the patient in compression last week with SANTYL to the wound on the left leg and the biopsy. Edema is much better and the depth of the wound is now at level of skin. Area is still the same Biopsy site on the right lateral leg we've also been using santyl with a border foam dressing and he is changing this himself. 02/19/17; Using silver alginate started last week to both the substantial left leg wound and the biopsy site on the right wound. He is tolerating compression well. Has a an appointment with his primary M.D. tomorrow wondering about diuretics although I'm wondering if the edema problem is actually lymphedema 02/26/17; the patient has been to see his primary doctor Dr. Jerrel Ivory at [facility] our primary care. She started him on Lasix 20 mg and this seems to have helped with the edema. However we are not making substantial change with the left lateral calf wound and inflammation. The biopsy site on the right leg also looks stable but not really all that different. 03/12/17; the patient has been to see vein and vascular Dr. Lucky Cowboy. He has had venous reflux studies I have not reviewed these. I  did get a call from his dermatology office. 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 They therefore recommended clobetasol  and tetracycline i.e. 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 03/26/17; apparently the patient just had reflux studies not an appointment with Dr. dew. She arrives in clinic today having applied clobetasol for 2-3 weeks. He notes over the last 2-3 days excessive drainage having to change the dressing 3-4 times a day and also expanding erythema. 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 Lillia Carmel at [facility] dermatology locally who kindly saw him at my request. 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 My concern was the degree of erythema medially extending from his left leg wound which was either inflammation from pyoderma or cellulitis. I put him on Augmentin however culture of the wound showed Pseudomonas which is quinolone sensitive. I really don't believe he has cellulitis however in view of everything I will continue and give him a course of Cipro. He is also on doxycycline as an immune modulator for the pyoderma. In addition to his original wound on the left lateral leg with surrounding erythema he has a wound on the right posterior calf which was an original biopsy site done by dermatology. This was felt to represent pathergy from pyoderma gangrenosum 04/16/17; pyoderma  gangrenosum. Saw Dr. Lillia Carmel yesterday. He has been using topical antibiotics to both wound areas his original wound on the left and the biopsies/pathergy area on the right. There is definitely some improvement in the inflammation around the wound on the right although the patient states he has increasing sensitivity of the wounds. He is on prednisone 60 and doxycycline 1 as prescribed by Dr. Lillia Carmel. He is covering the topical antibiotic with gauze and putting this in his own compression stocks and changing this daily. He states that Dr. Lottie Rater did a culture of the left leg wound yesterday 05/07/17; pyoderma gangrenosum. The patient saw Dr. Lillia Carmel yesterday and has a follow-up with her in one month. He is still using topical antibiotics to both wounds although he can't recall exactly what type. He is still on prednisone 60 mg. Dr. Lillia Carmel stated that the doxycycline could stop if we were in agreement. 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 The right medial leg was induced by [name] ([id number]) biopsy/pathergy. The area on the right is essentially healed. 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 The right medial leg has closed and remains closed. He is still on prednisone 60. He tells me he missed his last dermatology appointment with Dr. Lillia Carmel but will make another appointment. He reports that her blood sugar at a recent screen in [place] was high 200's. He was 180 today. 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 I think he is going to require still much longer prednisone perhaps another 3 months before attempting to taper. In the meantime his wound is a lot better. Smaller. He is cleaning this off daily and applying topical antibiotics. When he was last  in the clinic I thought about changing to [facility] and actually put in a couple of calls to dermatology although probably not during their business hours. In  any case the wound looks better smaller I don't think there is any need to change what he is doing 08/06/17-he is here in follow up evaluation for pyoderma left leg ulcer. He continues on oral prednisone. He has been using triple antibiotic ointment. There is surface debris and we will transition to [facility] and have him return in 2 weeks. He has lost 30 pounds since his last appointment with lifestyle modification. He may benefit from topical steroid cream for treatment this can be considered at a later date. 08/22/17 on evaluation today patient appears to actually be doing rather well in regard to his left lateral lower extremity ulcer. He has actually been managed by Dr. Dellia Nims most recently. Patient is currently on oral steroids at this time. This seems to have been of benefit for him. Nonetheless his last visit was actually with Leah on 08/06/17. Currently he is not utilizing any topical steroid creams although this could be of benefit as well. No fevers, chills, nausea, or vomiting noted at this time. 09/05/17 on evaluation today patient appears to be doing better in regard to his left lateral lower extremity ulcer. He has been tolerating the dressing changes without complication. He is using Santyl with good effect. Overall I'm very pleased with how things are standing at this point. Patient likewise is happy that this is doing better. 09/19/17 on evaluation today patient actually appears to be doing rather well in regard to his left lateral lower extremity ulcer. 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 He's not having any significant pain. 10/03/17 on evaluation today patient appears to be doing excellent in regard to his lower extremity wound on the left secondary to  Pyoderma gangrenosum. He has been tolerating the Santyl without complication and in general I feel like he's making good progress. 10/17/17 on evaluation today patient appears to be doing very well in regard to his left lateral lower surety ulcer. He has been tolerating the dressing changes without complication. There does not appear to be any evidence of infection he's alternating the Santyl and the triple antibiotic ointment every other day this seems to be doing well for him. 11/03/17 on evaluation today patient appears to be doing very well in regard to his left lateral lower extremity ulcer. He is been tolerating the dressing changes without complication which is good news. Fortunately there does not appear to be any evidence of infection which is also great news. Overall is doing excellent they are starting to taper down on the prednisone is down to 40 mg at this point it also started topical clobetasol for him. 11/17/17 on evaluation today patient appears to be doing well in regard to his left lateral lower surety ulcer. He's been tolerating the dressing changes without complication. He does note that he is having no pain, no excessive drainage or discharge, and overall he feels like things are going about how he would expect and hope they would. Overall he seems to have no evidence of infection at this time in my opinion which is good news. 12/04/17-He is seen in follow-up evaluation for right lateral lower extremity ulcer. He has been applying topical steroid cream. Today's measurement show slight increase in size. Over the next 2 weeks we will transition to every other day Santyl and steroid cream. He has been encouraged to monitor for changes and notify clinic with any concerns 12/15/17 on evaluation today patient's left lateral motion  the ulcer and fortunately is doing worse again at this point. This just since last week to this week has close to doubled in size according to the patient. I did  not seeing last week's I do not have a visual to compare this to in our system was also down so we do not have all the charts and at this point. Nonetheless it does have me somewhat concerned in regard to the fact that again he was worried enough about it he has contact the dermatology that placed them back on the full strength, 50 mg a day of the prednisone that he was taken previous. He continues to alternate using clobetasol along with Santyl at this point. He is obviously somewhat frustrated. 12/22/17 on evaluation today patient appears to be doing a little worse compared to last evaluation. Unfortunately the wound is a little deeper and slightly larger than the last week's evaluation. With that being said he has made some progress in regard to the irritation surrounding at this time unfortunately despite that progress that's been made he still has a significant issue going on here. I'm not certain that he is having really any true infection at this time although with the Pyoderma gangrenosum it can Below, Lathaniel ([id number]) sometimes be difficult to differentiate infection versus just inflammation. For that reason I discussed with him today the possibility of perform a wound culture to ensure there's nothing overtly infected. 01/06/18 on evaluation today patient's wound is larger and deeper than previously evaluated. With that being said it did appear that his wound was infected after my last evaluation with him. Subsequently I did end up prescribing a prescription for Bactrim DS which she has been taking and having no complication with. Fortunately there does not appear to be any evidence of infection at this point in time as far as anything spreading, no want to touch, and overall I feel like things are showing signs of improvement. 01/13/18 on evaluation today patient appears to be even a little larger and deeper than last time. There still muscle exposed in the base of the wound. Nonetheless  he does appear to be less erythematous I do believe inflammation is calming down also believe the infection looks like it's probably resolved at this time based on what I'm seeing. No fevers, chills, nausea, or vomiting noted at this time. 01/30/18 on evaluation today patient actually appears to visually look better for the most part. Unfortunately those visually this looks better he does seem to potentially have what may be an abscess in the muscle that has been noted in the central portion of the wound. This is the first time that I have noted what appears to be fluctuance in the central portion of the muscle. With that being said I'm somewhat more concerned about the fact that this might indicate an abscess formation at this location. I do believe that an ultrasound would be appropriate. This is likely something we need to try to do as soon as possible. He has been switch to mupirocin ointment and he is no longer using the steroid ointment as prescribed by dermatology he sees them again next week he's been decreased from 60 to 40 mg of prednisone. 03/09/18 on evaluation today patient actually appears to be doing a little better compared to last time I saw him. There's not as much erythema surrounding the wound itself. He I did review his most recent infectious disease note which was dated 02/24/18. He saw Dr. Michel Bickers in [place].  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 Patient is now off of antibiotics and they are continually observing this. There seems to be no change in the past two weeks in my pinion based on what the patient says and what I see today compared to what Dr. Megan Salon likely saw two weeks ago. No fevers, chills, nausea, or vomiting noted at this time. 03/23/18 on evaluation today patient's wound actually appears to be showing signs of improvement which is good news. He is currently  still on the Dapsone. He is also working on tapering the prednisone to get off of this and Dr. Lottie Rater is working with him in this regard. Nonetheless overall I feel like the wound is doing well it does appear based on the infectious disease note that I reviewed from Dr. 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 I did also review the results of his ultrasound of left lower extremity which revealed there was a dentist tissue in the base of the wound without an abscess noted. 04/06/18 on evaluation today the patient's left lateral lower extremity ulcer actually appears to be doing fairly well which is excellent news. There does not appear to be any evidence of infection at this time which is also great news. Overall he still does have a significantly large ulceration although little by little he seems to be making progress. He is down to 10 mg a day of the prednisone. 04/20/18 on evaluation today patient actually appears to be doing excellent at this time in regard to his left lower extremity ulcer. He's making signs of good progress unfortunately this is taking much longer than we would really like to see but nonetheless he is making progress. Fortunately there does not appear to be any evidence of infection at this time. No fevers, chills, nausea, or vomiting noted at this time. The patient has not been using the Santyl due to the cost he hadn't got in this field yet. He's mainly been using the antibiotic ointment topically. Subsequently he also tells me that he really has not been scrubbing in the shower I think this would be helpful again as I told him it doesn't have to be anything too aggressive to even make it believe just enough to keep it free of some of the loose slough and biofilm on the wound surface. 05/11/18 on evaluation today patient's wound appears to be making slow but  sure progress in regard to the left lateral lower extremity ulcer. He is been tolerating the dressing changes without complication. Fortunately there does not appear to be any evidence of infection at this time. He is still just using triple antibiotic ointment along with clobetasol occasionally over the area. He never got the Santyl and really does not seem to intend to in my pinion. 06/01/18 on evaluation today patient appears to be doing a little better in regard to his left lateral lower extremity ulcer. He states that overall he does not feel like he is doing as well with the Dapsone as he did with the prednisone. Nonetheless he sees his dermatologist later today and is gonna talk to them about the possibility of going back on the prednisone. Overall again I believe that the wound would be better if you would utilize [place] but he really  does not seem to be interested in going back to the [place] at this point. He has been using triple antibiotic ointment. MALIC, ROSTEN ([id number]) 06/15/18 on evaluation today patient's wound actually appears to be doing about the same at this point. Fortunately there is no signs of infection at this time. He has made slight improvements although he continues to not really want to clean the wound bed at this point. He states that he just doesn't mess with it he doesn't want to cause any problems with everything else he has going on. He has been on medication, antibiotics as prescribed by his dermatologist, for a staff infection of his lower extremities which is really drying out now and looking much better he tells me. Fortunately there is no sign of overall infection. 06/29/18 on evaluation today patient appears to be doing well in regard to his left lateral lower surety ulcer all things considering. Fortunately his staff infection seems to be greatly improved compared to previous. He has no signs of infection and this is drying up quite nicely. He is still the  doxycycline for this is no longer on cental, Dapsone, or any of the other medications. His dermatologist has recommended possibility of an infusion but right now he does not want to proceed with that. 07/13/18 on evaluation today patient appears to be doing about the same in regard to his left lateral lower surety ulcer. Fortunately there's no signs of infection at this time which is great news. Unfortunately he still builds up a significant amount of Slough/biofilm of the surface of the wound he still is not really cleaning this as he should be appropriately. Again I'm able to easily with saline and gauze remove the majority of this on the surface which if you would do this at home would likely be a dramatic improvement for him as far as getting the area to improve. Nonetheless overall I still feel like he is making progress is just very slow. I think Santyl will be of benefit for him as well. Still he has not gotten this as of this point. 07/27/18 on evaluation today patient actually appears to be doing little worse in regards of the erythema around the periwound region of the wound he also tells me that he's been having more drainage currently compared to what he was experiencing last time I saw him. He states not quite as bad as what he had because this was infected previously but nonetheless is still appears to be doing poorly. Fortunately there is no evidence of systemic infection at this point. The patient tells me that he is not going to be able to afford the Santyl. He is still waiting to hear about the infusion therapy with his dermatologist. Apparently she wants an updated colonoscopy first. 08/10/18 on evaluation today patient appears to be doing better in regard to his left lateral lower extremity ulcer. Fortunately he is showing signs of improvement in this regard he's actually been approved for Remicade infusion's as well although this has not been scheduled as of yet. Fortunately there's  no signs of active infection at this time in regard to the wound although he is having some issues with infection of the right lower extremity is been seen as dermatologist for this. Fortunately they are definitely still working with him trying to keep things under control. 09/07/18 on evaluation today patient is actually doing rather well in regard to his left lateral lower extremity ulcer. He notes these actually having some hair grow  back on his extremity which is something he has not seen in years. He also tells me that the pain is really not giving them any trouble at this time which is also good news overall she is very pleased with the progress he's using a combination of the mupirocin along with the probate is all mixed. 09/21/18 on evaluation today patient actually appears to be doing fairly well all things considered in regard to his looks from the ulcer. He's been tolerating the dressing changes without complication. Fortunately there's no signs of active infection at this time which is good news he is still on all antibiotics or prevention of the staff infection. He has been on prednisone for time although he states it is gonna contact his dermatologist and see if she put them on a short course due to some irritation that he has going on currently. Fortunately there's no evidence of any overall worsening this is going very slow I think cental would be something that would be helpful for him although he states that $50 for tube is quite expensive. He therefore is not willing to get that at this point. 10/06/18 on evaluation today patient actually appears to be doing decently well in regard to his left lateral leg ulcer. He's been tolerating the dressing changes without complication. Fortunately there's no signs of active infection at this time. Overall I'm actually rather pleased with the progress he's making although it's slow he doesn't show any signs of infection and he does seem to be  making some improvement. I do believe that he may need a switch up and dressings to try to help this to heal more appropriately and quickly. 10/19/18 on evaluation today patient actually appears to be doing better in regard to his left lateral lower extremity ulcer. This is shown signs of having much less Slough buildup at this point due to the fact he has been using the [employer]. Obviously this is very good news. The overall size of the wound is not dramatically smaller but again the appearance is. 11/02/18 on evaluation today patient actually appears to be doing quite well in regard to his lower [place] ulcer. A lot of the skin around the ulcer is actually somewhat irritating at this point this seems to be more due to the dressing causing irritation from the adhesive that anything else. Fortunately there is no signs of active infection at this time. 11/24/18 on evaluation today patient appears to be doing a little worse in regard to his overall appearance of his lower extremity Eilert, [name] ([id number]) ulcer. There's more erythema and warmth around the wound unfortunately. He is currently on doxycycline which he has been on for some time. With that being said I'm not sure that seems to be helping with what appears to possibly be an acute cellulitis with regard to his left lower extremity ulcer. No fevers, chills, nausea, or vomiting noted at this time. 12/08/18 on evaluation today patient's wounds actually appears to be doing significantly better compared to his last evaluation. He has been using Santyl along with alternating tripling about appointment as well as the steroid cream seems to be doing quite well and the wound is showing signs of improvement which is excellent news. Fortunately there's no evidence of infection and in fact his culture came back negative with only normal skin flora noted. 12/21/2018 upon evaluation today patient actually appears to be doing excellent with regard to his ulcer.  This is actually the best that I have seen it  since have been helping to take care of him. It is both smaller as well as less slough noted on the surface of the wound and seems to be showing signs of good improvement with new skin growing from the edges. He has been using just the triamcinolone he does wonder if he can get a refill of that ointment today. 01/04/2019 upon evaluation today patient actually appears to be doing well with regard to his left lateral lower extremity ulcer. With that being said it does not appear to be that he is doing quite as well as last time as far as progression is concerned. There does not appear to be any signs of infection or significant irritation which is good news. With that being said I do believe that he may benefit from switching to a collagen based dressing based on how clean The wound appears. Patient History Information obtained from Patient. Family History Diabetes - Mother,Maternal Grandparents, Heart Disease - Mother,Maternal Grandparents, Hereditary Spherocytosis - Siblings, Hypertension - Maternal Grandparents, Stroke - Siblings, No family history of Cancer, Kidney Disease, Lung Disease, Seizures, Thyroid Problems, Tuberculosis. Social History Current some day smoker, Marital Status - Married, Alcohol Use - Rarely, Drug Use - No History, Caffeine Use - Moderate. Medical History Eyes Denies history of Cataracts, Glaucoma, Optic Neuritis Ear/Nose/Mouth/Throat Denies history of Chronic sinus problems/congestion, Middle ear problems Hematologic/Lymphatic Denies history of Anemia, Hemophilia, Human Immunodeficiency Virus, Lymphedema, Sickle Cell Disease Respiratory Patient has history of Sleep Apnea - cpap Denies history of Aspiration, Asthma, Chronic Obstructive Pulmonary Disease (COPD), Pneumothorax Cardiovascular Patient has history of Hypertension Denies history of Angina, Arrhythmia, Congestive Heart Failure, Coronary Artery Disease, Deep  Vein Thrombosis, Hypotension, Myocardial Infarction, Peripheral Arterial Disease, Peripheral Venous Disease, Phlebitis, Vasculitis Gastrointestinal Patient has history of Colitis Endocrine Denies history of Type I Diabetes, Type II Diabetes Genitourinary Denies history of End Stage Renal Disease Immunological Denies history of Lupus Erythematosus, Raynaud s, Scleroderma Integumentary (Skin) Denies history of History of Burn, History of pressure wounds Musculoskeletal Denies history of Gout, Rheumatoid Arthritis, Osteoarthritis, Osteomyelitis Neurologic Denies history of Dementia, Neuropathy, Quadriplegia, Paraplegia, Seizure Disorder Oncologic HAWKE, VILLALPANDO ([id number]) Denies history of Received Chemotherapy, Received Radiation Psychiatric Denies history of Anorexia/bulimia, Confinement Anxiety Review of Systems (ROS) Constitutional Symptoms (General Health) Denies complaints or symptoms of Fatigue, Fever, Chills, Marked Weight Change. Respiratory Denies complaints or symptoms of Chronic or frequent coughs, Shortness of Breath. Cardiovascular Complains or has symptoms of LE edema. Denies complaints or symptoms of Chest pain. Psychiatric Denies complaints or symptoms of Anxiety, Claustrophobia. Objective Constitutional Well-nourished and well-hydrated in no acute distress. Vitals Time Taken: 8:27 AM, Height: 71 in, Weight: 338 lbs, BMI: 47.1, Temperature: 98.4 F, Pulse: 82 bpm, Respiratory Rate: 18 breaths/min, Blood Pressure: 129/85 mmHg. Respiratory normal breathing without difficulty. clear to auscultation bilaterally. Cardiovascular regular rate and rhythm with normal S1, S2. Psychiatric this patient is able to make decisions and demonstrates good insight into disease process. Alert and Oriented x 3. pleasant and cooperative. General Notes: Patient's wound bed upon evaluation today actually appears to have a fairly good granulation surface although there is a lot of  scar tissue just from the frequent healing/worsening cycle that he experienced with this wound. Fortunately there is no signs of active infection at this time which is good news. No fevers, chills, nausea, vomiting, or diarrhea. No sharp debridement was performed at this time. Integumentary (Hair, Skin) Wound #1 status is Open. Original cause of wound was Gradually Appeared. The wound is located on  the Left,Lateral Lower Leg. The wound measures 2.8cm length x 1.7cm width x 0.2cm depth; 3.738cm^2 area and 0.748cm^3 volume. There is Fat Layer (Subcutaneous Tissue) Exposed exposed. There is no tunneling or undermining noted. There is a medium amount of serous drainage noted. The wound margin is distinct with the outline attached to the wound base. There is medium (34-66%) red granulation within the wound bed. There is a medium (34-66%) amount of necrotic tissue within the wound bed including Adherent Slough. 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 Anesthetic (add to Medication List): Wound #1 Left,Lateral Lower Leg: Topical Lidocaine 4% cream applied to wound bed prior to debridement (In Clinic Only). Primary Wound Dressing: Wound #1 Left,Lateral Lower Leg: Collagen Secondary Dressing: Wound #1 Left,Lateral Lower Leg: Boardered Foam Dressing Dressing Change Frequency: Wound #1 Left,Lateral Lower Leg: Change dressing every other day. Follow-up Appointments: Wound #1 Left,Lateral Lower Leg: Return Appointment in 2 weeks. Edema Control: Wound #1 Left,Lateral Lower Leg: Elevate legs to the level of the heart and pump ankles as often as possible 1. I am in a suggest we switch to a plain collagen dressing as I think this may help to promote additional  granulation. Hopefully new epithelization will be noted around the edge of the wound as well. 2. He will cover this with a border foam dressing. 3. I recommend the patient elevate his legs as much as possible in order to help with edema control. Also think compression stockings would be a good idea although I believe he has issues with those. We will see patient back for reevaluation in 2 weeks here in the clinic. If anything worsens or changes patient will contact our office for additional recommendations. Electronic Signature(s) Signed: 01/04/2019 8:56:10 AM By: Worthy Keeler PA-C Entered By: Worthy Keeler on 01/04/2019 08:56:09 TRAYVEN, LUMADUE ([id number]) -------------------------------------------------------------------------------- ROS/PFSH Details Patient Name: [name] Date of Service: 01/04/2019 8:15 AM Medical Record Number: [id number] Patient Account Number: [id number] Date of Birth/Sex: 05-29-1980 (37 y.o. M) Treating RN: Harold Barban Primary Care Provider: [name] Other Clinician: Referring Provider: [name] Treating Provider/Extender: Melburn Hake, Lihanna Biever Weeks in Treatment: 108 Information Obtained From Patient Constitutional Symptoms (General Health) Complaints and Symptoms: Negative for: Fatigue; Fever; Chills; Marked Weight Change Respiratory Complaints and Symptoms: Negative for: Chronic or frequent coughs; Shortness of Breath Medical History: Positive for: Sleep Apnea - cpap Negative for: Aspiration; Asthma; Chronic Obstructive Pulmonary Disease (COPD); Pneumothorax Cardiovascular Complaints and Symptoms: Positive for: LE edema Negative for: Chest pain Medical History: Positive for: Hypertension Negative for: Angina; Arrhythmia; Congestive Heart Failure; Coronary Artery Disease; Deep Vein Thrombosis; Hypotension; Myocardial Infarction; Peripheral Arterial Disease; Peripheral Venous Disease; Phlebitis; Vasculitis Psychiatric Complaints  and Symptoms: Negative for: Anxiety; Claustrophobia Medical History: Negative for: Anorexia/bulimia; Confinement Anxiety Eyes Medical History: Negative for: Cataracts; Glaucoma; Optic Neuritis Ear/Nose/Mouth/Throat Medical History: Negative for: Chronic sinus problems/congestion; Middle ear problems Hematologic/Lymphatic Medical History: Negative for: Anemia; Hemophilia; Human Immunodeficiency Virus; Lymphedema; Sickle Cell Disease Niemeier, Shawna ([id number]) Gastrointestinal Medical History: Positive for: Colitis Endocrine Medical History: Negative for: Type I Diabetes; Type II Diabetes Genitourinary Medical History: Negative for: End Stage Renal Disease Immunological Medical History: Negative for: Lupus Erythematosus; Raynaudos; Scleroderma Integumentary (Skin) Medical History: Negative for: History of Burn; History of pressure wounds Musculoskeletal Medical History: Negative for: Gout; Rheumatoid Arthritis; Osteoarthritis; Osteomyelitis Neurologic Medical History: Negative  for: Dementia; Neuropathy; Quadriplegia; Paraplegia; Seizure Disorder Oncologic Medical History: Negative for: Received Chemotherapy; Received Radiation Immunizations Pneumococcal Vaccine: Received Pneumococcal Vaccination: No Implantable Devices No devices added Family and Social History Cancer: No; Diabetes: Yes - Mother,Maternal Grandparents; Heart Disease: Yes - Mother,Maternal Grandparents; Hereditary Spherocytosis: Yes - Siblings; Hypertension: Yes - Maternal Grandparents; Kidney Disease: No; Lung Disease: No; Seizures: No; Stroke: Yes - Siblings; Thyroid Problems: No; Tuberculosis: No; Current some day smoker; Marital Status - Married; Alcohol Use: Rarely; Drug Use: No History; Caffeine Use: Moderate; Financial Concerns: No; Food, Clothing or Shelter Needs: No; Support System Lacking: No; Transportation Concerns: No Physician Affirmation I have reviewed and agree with the above  information. Electronic Signature(s) ARMONIE, STATEN ([id number]) Signed: 01/04/2019 12:05:39 PM By: Worthy Keeler PA-C Signed: 01/04/2019 4:31:36 PM By: Harold Barban Entered By: Worthy Keeler on 01/04/2019 08:55:08 KORVIN, VALENTINE ([id number]) -------------------------------------------------------------------------------- SuperBill Details Patient Name: [name] Date of Service: 01/04/2019 Medical Record Number: [id number] Patient Account Number: [id number] Date of Birth/Sex: [birth year] (38 y.o. 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

## 2019-01-04 NOTE — Progress Notes (Addendum)
SHIQUAN, MATHIEU ([id number]) Visit Report for 01/04/2019 Arrival Information Details Patient Name: [name], [name] Date of Service: 01/04/2019 8:15 AM Medical Record Number: [id number] Patient Account Number: [id number] Date of Birth/Sex: 1980/10/06 (38 y.o. M) Treating RN: Harold Barban Primary Care Hayde Kilgour: [name] Other Clinician: Referring Delayza Lungren: [name] Treating Chuck Caban/Extender: Melburn Hake, [name]: 8 Visit Information History Since Last Visit Added or deleted any medications: No Patient Arrived: Ambulatory Any new allergies or adverse reactions: No Arrival Time: 08:26 Had a fall or experienced change in No Accompanied By: self activities of daily living that may affect Transfer Assistance: None risk of falls: Patient Identification Verified: Yes Signs or symptoms of abuse/neglect since last visito No Secondary Verification Process Completed: Yes Hospitalized since last visit: No Patient Requires Transmission-Based No Implantable device outside of the clinic excluding No Precautions: cellular tissue based products placed in the center Patient Has Alerts: No since last visit: Has Dressing in Place as Prescribed: Yes Pain Present Now: No Electronic Signature(s) Signed: 01/04/2019 4:01:11 PM By: Lorine Bears RCP, RRT, CHT Entered By: Lorine Bears on 01/04/2019 08:27:17 [name] ([id number]) -------------------------------------------------------------------------------- Clinic Level of Care Assessment Details Patient Name: [name] Date of Service: 01/04/2019 8:15 AM Medical Record Number: [id number] Patient Account Number: [id number] Date of Birth/Sex: [birth year] (37 y.o. M) Treating RN: Harold Barban Primary Care Jisel Fleet: [name] Other Clinician: Referring Kaytlynne Neace: [name] Treating Clemence Lengyel/Extender: Melburn Hake, [name]: 108 Clinic Level of Care Assessment  Items TOOL 4 Quantity Score []  - Use when only an EandM is performed on FOLLOW-UP visit 0 ASSESSMENTS - Nursing Assessment / Reassessment X - Reassessment of Co-morbidities (includes updates in patient status) 1 10 X- 1 5 Reassessment of Adherence to Treatment Plan ASSESSMENTS - Wound and Skin Assessment / Reassessment X - Simple Wound Assessment / Reassessment - one wound 1 5 []  - 0 Complex Wound Assessment / Reassessment - multiple wounds []  - 0 Dermatologic / Skin Assessment (not related to wound area) ASSESSMENTS - Focused Assessment []  - Circumferential Edema Measurements - multi extremities 0 []  - 0 Nutritional Assessment / Counseling / Intervention []  - 0 Lower Extremity Assessment (monofilament, tuning fork, pulses) []  - 0 Peripheral Arterial Disease Assessment (using hand held doppler) ASSESSMENTS - Ostomy and/or Continence Assessment and Care []  - Incontinence Assessment and Management 0 []  - 0 Ostomy Care Assessment and Management (repouching, etc.) PROCESS - Coordination of Care X - Simple Patient / Family Education for ongoing care 1 15 []  - 0 Complex (extensive) Patient / Family Education for ongoing care []  - 0 Staff obtains Programmer, systems, Records, Test Results / Process Orders []  - 0 Staff telephones HHA, Nursing Homes / Clarify orders / etc []  - 0 Routine Transfer to another Facility (non-emergent condition) []  - 0 Routine Hospital Admission (non-emergent condition) []  - 0 New Admissions / Biomedical engineer / Ordering NPWT, Apligraf, etc. []  - 0 Emergency Hospital Admission (emergent condition) X- 1 10 Simple Discharge Coordination [name], [name] ([id number]) []  - 0 Complex (extensive) Discharge Coordination PROCESS - Special Needs []  - Pediatric / Minor Patient Management 0 []  - 0 Isolation Patient Management []  - 0 Hearing / Language / Visual special needs []  - 0 Assessment of Community assistance (transportation, D/C planning, etc.) []  -  0 Additional assistance / Altered mentation []  - 0 Support Surface(s) Assessment (bed, cushion, seat, etc.) INTERVENTIONS - Wound Cleansing / Measurement X - Simple Wound Cleansing - one wound 1 5 []  - 0 Complex Wound  Cleansing - multiple wounds X- 1 5 Wound Imaging (photographs - any number of wounds) []  - 0 Wound Tracing (instead of photographs) X- 1 5 Simple Wound Measurement - one wound []  - 0 Complex Wound Measurement - multiple wounds INTERVENTIONS - Wound Dressings X - Small Wound Dressing one or multiple wounds 1 10 []  - 0 Medium Wound Dressing one or multiple wounds []  - 0 Large Wound Dressing one or multiple wounds X- 1 5 Application of Medications - topical []  - 0 Application of Medications - injection INTERVENTIONS - Miscellaneous []  - External ear exam 0 []  - 0 Specimen Collection (cultures, biopsies, blood, body fluids, etc.) []  - 0 Specimen(s) / Culture(s) sent or taken to Lab for analysis []  - 0 Patient Transfer (multiple staff / Civil Service fast streamer / Similar devices) []  - 0 Simple Staple / Suture removal (25 or less) []  - 0 Complex Staple / Suture removal (26 or more) []  - 0 Hypo / Hyperglycemic Management (close monitor of Blood Glucose) []  - 0 Ankle / Brachial Index (ABI) - do not check if billed separately X- 1 5 Vital Signs [name], [name] ([id number]) Has the patient been seen at the hospital within the last three years: Yes Total Score: 80 Level Of Care: New/Established - Level 3 Electronic Signature(s) Signed: 01/04/2019 4:31:36 PM By: Harold Barban Entered By: Harold Barban on 01/04/2019 08:47:44 [name] ([id number]) -------------------------------------------------------------------------------- Encounter Discharge Information Details Patient Name: [name] Date of Service: 01/04/2019 8:15 AM Medical Record Number: [id number] Patient Account Number: [id number] Date of Birth/Sex: [birth year] (37 y.o. M) Treating RN: Army Melia Primary  Care Tejasvi Brissett: [name] Other Clinician: Referring Maanav Kassabian: [name] Treating Genieve Ramaswamy/Extender: Melburn Hake, [name]: 67 Encounter Discharge Information Items Discharge Condition: Stable Ambulatory Status: Ambulatory Discharge Destination: Home Transportation: Private Auto Accompanied By: self Schedule Follow-up Appointment: Yes Clinical Summary of Care: Electronic Signature(s) Signed: 01/04/2019 10:20:59 AM By: Army Melia Entered By: Army Melia on 01/04/2019 08:58:16 [name] ([id number]) -------------------------------------------------------------------------------- Lower Extremity Assessment Details Patient Name: [name] Date of Service: 01/04/2019 8:15 AM Medical Record Number: [id number] Patient Account Number: [id number] Date of Birth/Sex: 1980/10/14 (37 y.o. M) Treating RN: Montey Hora Primary Care Coriana Angello: [name] Other Clinician: Referring Chezney Huether: [name] Treating Sabryna Lahm/Extender: STONE III, [name]: 108 Edema Assessment Assessed: [Left: No] [Right: No] Edema: [Left: Ye] [Right: s] Vascular Assessment Pulses: Dorsalis Pedis Palpable: [Left:Yes] Electronic Signature(s) Signed: 01/05/2019 5:30:55 PM By: Montey Hora Entered By: Montey Hora on 01/04/2019 08:30:03 [name] ([id number]) -------------------------------------------------------------------------------- Multi Wound Chart Details Patient Name: [name] Date of Service: 01/04/2019 8:15 AM Medical Record Number: [id number] Patient Account Number: [id number] Date of Birth/Sex: 1980-09-04 (37 y.o. M) Treating RN: Harold Barban Primary Care Wyonia Fontanella: [name] Other Clinician: Referring Aljean Horiuchi: [name] Treating Icela Glymph/Extender: Melburn Hake, [name]: 108 Vital Signs Height(in): 71 Pulse(bpm): 82 Weight(lbs): 338 Blood Pressure(mmHg): 129/85 Body Mass Index(BMI):  47 Temperature(F): 98.4 Respiratory Rate 18 (breaths/min): Photos: [N/A:N/A] Wound Location: Left Lower Leg - Lateral N/A N/A Wounding Event: Gradually Appeared N/A N/A Primary Etiology: Pyoderma N/A N/A Comorbid History: Sleep Apnea, Hypertension, N/A N/A Colitis Date Acquired: 11/18/2015 N/A N/A Weeks of Treatment: 108 N/A N/A Wound Status: Open N/A N/A Measurements L x W x D 2.8x1.7x0.2 N/A N/A (cm) Area (cm) : 3.738 N/A N/A Volume (cm) : 0.748 N/A N/A % Reduction in Area: 23.90% N/A N/A % Reduction in Volume: 81.00% N/A N/A Classification: Full Thickness With Exposed N/A N/A Support Structures Exudate Amount:  Medium N/A N/A Exudate Type: Serous N/A N/A Exudate Color: amber N/A N/A Wound Margin: Distinct, outline attached N/A N/A Granulation Amount: Medium (34-66%) N/A N/A Granulation Quality: Red N/A N/A Necrotic Amount: Medium (34-66%) N/A N/A Exposed Structures: Fat Layer (Subcutaneous N/A N/A Tissue) Exposed: Yes Fascia: No Tendon: No Muscle: No Oscar, Kellie ([id number]) Joint: No Bone: No Epithelialization: None N/A N/A Treatment Notes Electronic Signature(s) Signed: 01/04/2019 4:31:36 PM By: Harold Barban Entered By: Harold Barban on 01/04/2019 08:46:34 [name] ([id number]) -------------------------------------------------------------------------------- Multi-Disciplinary Care Plan Details Patient Name: [name] Date of Service: 01/04/2019 8:15 AM Medical Record Number: [id number] Patient Account Number: [id number] Date of Birth/Sex: [birth year] (37 y.o. M) Treating RN: Harold Barban Primary Care Jael Waldorf: [name] Other Clinician: Referring Deshay Blumenfeld: [name] Treating Amorina Doerr/Extender: Melburn Hake, [name]: 80 Active Inactive Orientation to the Wound Care Program Nursing Diagnoses: Knowledge deficit related to the wound healing center program Goals: Patient/caregiver will verbalize understanding of the  [facility] Date Initiated: 12/11/2016 Target Resolution Date: 03/13/2017 Goal Status: Active Interventions: Provide education on orientation to the wound center Notes: Venous Leg Ulcer Nursing Diagnoses: Knowledge deficit related to disease process and management Potential for venous Insuffiency (use before diagnosis confirmed) Goals: Non-invasive venous studies are completed as ordered Date Initiated: 12/11/2016 Target Resolution Date: 03/13/2017 Goal Status: Active Patient will maintain optimal edema control Date Initiated: 12/11/2016 Target Resolution Date: 03/13/2017 Goal Status: Active Patient/caregiver will verbalize understanding of disease process and disease management Date Initiated: 12/11/2016 Target Resolution Date: 03/13/2017 Goal Status: Active Verify adequate tissue perfusion prior to therapeutic compression application Date Initiated: 12/11/2016 Target Resolution Date: 03/13/2017 Goal Status: Active Interventions: Assess peripheral edema status every visit. Compression as ordered Provide education on venous insufficiency Treatment Activities: [name], [name] ([id number]) Therapeutic compression applied : 12/11/2016 Notes: Wound/Skin Impairment Nursing Diagnoses: Impaired tissue integrity Knowledge deficit related to ulceration/compromised skin integrity Goals: Patient/caregiver will verbalize understanding of skin care regimen Date Initiated: 12/11/2016 Target Resolution Date: 03/13/2017 Goal Status: Active Ulcer/skin breakdown will have a volume reduction of 30% by week 4 Date Initiated: 12/11/2016 Target Resolution Date: 03/13/2017 Goal Status: Active Ulcer/skin breakdown will have a volume reduction of 50% by week 8 Date Initiated: 12/11/2016 Target Resolution Date: 03/13/2017 Goal Status: Active Ulcer/skin breakdown will have a volume reduction of 80% by week 12 Date Initiated: 12/11/2016 Target Resolution Date: 03/13/2017 Goal Status:  Active Ulcer/skin breakdown will heal within 14 weeks Date Initiated: 12/11/2016 Target Resolution Date: 03/13/2017 Goal Status: Active Interventions: Assess patient/caregiver ability to obtain necessary supplies Assess patient/caregiver ability to perform ulcer/skin care regimen upon admission and as needed Assess ulceration(s) every visit Provide education on ulcer and skin care Treatment Activities: Skin care regimen initiated : 12/11/2016 Topical wound management initiated : 12/11/2016 Notes: Electronic Signature(s) Signed: 01/04/2019 4:31:36 PM By: Harold Barban Entered By: Harold Barban on 01/04/2019 08:46:25 [name] ([id number]) -------------------------------------------------------------------------------- Pain Assessment Details Patient Name: [name] Date of Service: 01/04/2019 8:15 AM Medical Record Number: [id number] Patient Account Number: [id number] Date of Birth/Sex: 07-18-1980 (37 y.o. M) Treating RN: Harold Barban Primary Care Ayuub Penley: [name] Other Clinician: Referring Kimorah Ridolfi: [name] Treating Yanis Juma/Extender: Melburn Hake, [name]: 108 Active Problems Location of Pain Severity and Description of Pain Patient Has Paino No Site Locations Pain Management and Medication Current Pain Management: Electronic Signature(s) Signed: 01/04/2019 4:01:11 PM By: Lorine Bears RCP, RRT, CHT Signed: 01/04/2019 4:31:36 PM By: Harold Barban Entered By: Lorine Bears on 01/04/2019 08:27:25 [name], [name] ([id number]) --------------------------------------------------------------------------------  Patient/Caregiver Education Details Patient Name: [name], [name] Date of Service: 01/04/2019 8:15 AM Medical Record Number: [id number] Patient Account Number: [id number] Date of Birth/Gender: 09-17-1980 (38 y.o. M) Treating RN: Harold Barban Primary Care Physician: [name] Other Clinician: Referring  Physician: [name] Treating Physician/Extender: Sharalyn Ink in Treatment: 108 Education Assessment Education Provided To: Patient Education Topics Provided Venous: Handouts: Controlling Swelling with Compression Stockings Methods: Demonstration, Explain/Verbal Responses: State content correctly Wound/Skin Impairment: Handouts: Caring for Your Ulcer Methods: Demonstration, Explain/Verbal Responses: State content correctly Electronic Signature(s) Signed: 01/04/2019 4:31:36 PM By: Harold Barban Entered By: Harold Barban on 01/04/2019 08:46:57 [name], [name] ([id number]) -------------------------------------------------------------------------------- Wound Assessment Details Patient Name: [name] Date of Service: 01/04/2019 8:15 AM Medical Record Number: [id number] Patient Account Number: [id number] Date of Birth/Sex: 1980/07/20 (37 y.o. M) Treating RN: Montey Hora Primary Care Winni Ehrhard: [name] Other Clinician: Referring Ronnett Pullin: [name] Treating Jalene Lacko/Extender: Melburn Hake, [name]: 108 Wound Status Wound Number: 1 Primary Etiology: Pyoderma Wound Location: Left Lower Leg - Lateral Wound Status: Open Wounding Event: Gradually Appeared Comorbid History: Sleep Apnea, Hypertension, Colitis Date Acquired: 11/18/2015 Weeks Of Treatment: 108 Clustered Wound: No Photos Wound Measurements Length: (cm) 2.8 % Reduction Width: (cm) 1.7 % Reduction Depth: (cm) 0.2 Epitheliali Area: (cm) 3.738 Tunneling: Volume: (cm) 0.748 Underminin in Area: 23.9% in Volume: 81% zation: None No g: No Wound Description Full Thickness With Exposed Support Foul Odor A Classification: Structures Slough/Fibr Wound Margin: Distinct, outline attached Exudate Medium Amount: Exudate Type: Serous Exudate Color: amber fter Cleansing: No ino Yes Wound Bed Granulation Amount: Medium (34-66%) Exposed Structure Granulation Quality:  Red Fascia Exposed: No Necrotic Amount: Medium (34-66%) Fat Layer (Subcutaneous Tissue) Exposed: Yes Necrotic Quality: Adherent Slough Tendon Exposed: No Muscle Exposed: No Joint Exposed: No Bone Exposed: No [name], [name] ([id number]) Treatment Notes Wound #1 (Left, Lateral Lower Leg) Notes collagen, BFD Electronic Signature(s) Signed: 01/04/2019 8:56:17 AM By: Harold Barban Signed: 01/05/2019 5:30:55 PM By: Montey Hora Entered By: Harold Barban on 01/04/2019 08:56:17 [name] ([id number]) -------------------------------------------------------------------------------- Vitals Details Patient Name: [name] Date of Service: 01/04/2019 8:15 AM Medical Record Number: [id number] Patient Account Number: [id number] Date of Birth/Sex: 11/04/1980 (37 y.o. M) Treating RN: Harold Barban Primary Care Pryor Guettler: [name] Other Clinician: Referring [place] Every: [name] Treating Amilcar Reever/Extender: Melburn Hake, [name]: 108 Vital Signs Time Taken: 08:27 Temperature (F): 98.4 Height (in): 71 Pulse (bpm): 82 Weight (lbs): 338 Respiratory Rate (breaths/min): 18 Body Mass Index (BMI): 47.1 Blood Pressure (mmHg): 129/85 Reference Range: 80 - 120 mg / dl Electronic Signature(s) Signed: 01/04/2019 4:01:11 PM By: Lorine Bears RCP, RRT, CHT Entered By: Becky Sax, Amado Nash on 01/04/2019 08:28:35

## 2019-01-05 NOTE — Unmapped (Signed)
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 Previous aerobic swabs with MSSA. Patient has a tendency to pick at certain areas (evidenced by lichenification) which exacerbates the process. Primary lesion may be initial papules/pustules of PG but over time he has also demonstrated some itching related to venous stasis dermatitis and possible folliculitis.  His lesions are likely multifactorial related to all of these issues.  - Cont doxycycline 100mg  BID with food and a full glass of water - discussed risks of stomach upset, sun sensitivity and erosive esophagitis.  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 LL ulcer healing but concern for superficial pyoderma vs traumatic superinfected crusted erosions to R leg. Smoking hx and obesity likely contributing. Hx of UC also likely related. Currently well-controlled  - Currently off of long-term high-dose prednisone use that was complicated by development of cushingoid features and development of diabetes mellitus; stable while currently on dapsone  - Humira and Remicade previously ordered and denied by insurance  - Continue follow-up with wound care  - Continue wearing compression stockings  -cont hibiclens  -cont hydrocortisone and TAC cream per patient preference  -avoid clobetasol given that he develops cellulitis easily under his compression  -consider clinda/rifampin  -consider aspirin or pentoxyfilline  -consider dapsone gel  -cont regular wound care visits  -patient following with GI - colonoscopy currently being scheduled  - ILK today to R lower leg  (pt reports previous improvement with ILK in the past)  After the patient was informed of risks, benefits and side effects of intralesional steroid injection, the patient elected to undergo injection. Informed verbal consent was obtained. Risk of atrophy  and dyspigmentation with injection was explained. Kenalog 10 mg/ml was injected locally into the sites located R ant shin in a clean fashion following alcohol prep.   Total volume in ml=2.5.  Number of sites treated: >15   Wound care was explained to the patient    Inflamed Seborrheic Keratosis:  Left preauricular  Due to the evidence of inflammation and irritation as well as the symptomatic nature of the lesion we discussed option of cryotherapy treatment with the patient, who would like to proceed.  After verbal consent was obtained with discussion of risks and benefits of the procedure including scarring, dyspigmentation, recurrence or persistence, the lesion was treated with cryotherapy x 1 10-15 second freeze-thaw cycles. The patient tolerated this well and was instructed on post-cryotherapy wound care.    Total number treated: 1    RTC: 1 month    SUBJECTIVE    Chief Complaint   Patient presents with   ??? Follow-up     Right leg still isnt healed or gone away       HPI:  This is a pleasant 38 y.o. male with a history of pyoderma gangrenosum and MRSA infection who presents today for f/up. Last seen by Dr. Carles Collet on 10/09/2018. At last visit, received ILK with improvement. Notes that his legs are overall improving but still has some active areas of the right lower leg.  Taking doxycycline twice daily and  tolerating well. Has several areas he picks at on his legs. No other skin concerns today.     Disease history:  -Starting about 2013, he started to develop itchy bumps that enlarge when he scratches and picks at them. This initially affected just his lower legs, but more recently prior to initial presentation to our clinic, involvement spread to his thighs, buttocks, arms, and forearms.  -Areas start as tiny bumps that he then scratches. 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   -In spring 2018, an ulcer started on the L lateral calf and gradually enlarged, but was slowly healing and shrinking by the time of presentation to our clinic. Denies any trauma at this site and notes that it started similar to other rashes on skin. Prior treatments include oral abx, including as recently as 12/2016 with courses of clinda and doxy, as well as topical steroids. He has h/o ulcerative colitis, now in remission without meds since 2015. Denies h/o liver or kidney disease or DM. Used to live in [place] when this started. Works for [employer] and is on feet all day, endorses swelling of legs. Denies new meds prior to onset.  - At his initial visit 01/22/2017, clobetasol ointment and doxycycline 100mg  bid x 4 weeks were started. A biopsy of a keratotic pruritic plaque was obtained. 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 Tissue culture grew 4+ MSSA. 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   - w/up for underlying pruritus (given patient report and difficulty not picking at the involved areas) on 10/17 revealed unremarkable HIV, hepatitis panel, LDH, TSH, free T4 . Outside slides from previous bx were reviewed by Dr. Eyvonne Left and showed venous stasis changes as well as a neutrophilic infiltrate that could be consistent with PG. He was started on topical clobetasol BID. He was continued on PO doxycycline for additional 6 weeks. His left leg ulcer continued to grow and the site of his previous biopsy on the posterior right leg has also developed a large non healing ulcer.   -10/18: w/up for underlying etiology of PG negative on 10/18: negative spep/upep, cbc w/diff consistently normal, and reports a known hx of ulcerative colitis which is in remssion  - Repeat biopsy as below done on 03/26/17 primarily c/w PG vs infectious with one potential organism on Fite stain but unclear if related.   - Patient started on prednisone on 04/01/17 with almost resolution by 7/19.  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 Stopped doxepin and gabapentin given lack of improvement      Pertinent PMH:   Ulcerative colitis, in remission per patient  HTN  DM   Denies h/o liver, kidney abnormalities     ROS: A balance of 10 systems was reviewed and negative.     PE:  General: Well-developed, well-nourished male in no acute distress, resting comfortably.  Neuro: Alert and oriented, answers questions appropriately.  Skin: Examination of face, neck, bilateral upper extremities, bilateral lower extremities was notable for:  - Right lower leg with lichenified plaques and papules and excoriations  L leg with ulcer w/o surrounding violaceous color, ulcer is  thinning  - Stuck on appearing waxy papule with surrounding erythema of the left preauricular  -All other areas examined were normal or had no significant findings.

## 2019-01-06 ENCOUNTER — Telehealth: Admit: 2019-01-06 | Discharge: 2019-01-07 | Payer: PRIVATE HEALTH INSURANCE

## 2019-01-06 DIAGNOSIS — M2550 Pain in unspecified joint: Principal | ICD-10-CM

## 2019-01-06 NOTE — Unmapped (Signed)
I spent 18 minutes on the real-time audio and video with the patient. I spent an additional 15 minutes on pre- and post-visit activities.     The patient was physically located in [place] or a state in which I am permitted to provide care. The patient and/or parent/guardian understood that s/he may incur co-pays and cost sharing, and agreed to the telemedicine visit. The visit was reasonable and appropriate under the circumstances given the patient's presentation at the time.    The patient and/or parent/guardian has been advised of the potential risks and limitations of this mode of treatment (including, but not limited to, the absence of in-person examination) and has agreed to be treated using telemedicine. The patient's/patient's family's questions regarding telemedicine have been answered.     If the visit was completed in an ambulatory setting, the patient and/or parent/guardian has also been advised to contact their provider???s office for worsening conditions, and seek emergency medical treatment and/or call 911 if the patient deems either necessary.      REASON FOR VISIT: f/u arthralgia     Identification: Pt self identified using name and date of birth  Patient location: [place]  The limitations of this telemedicine encounter were discussed with patient. Both the patient and myself agreed to this encounter despite these limitations. Benefits of this telemedicine encounter included allowing for continued care of patient and minimizing risk of exposure to COVID-19.       HISTORY: [name] is a 38 y.o. male with hx of IBD, pyoderma gangrenosum, and arthralgia. Initially evaluated by Dr Sullivan Lone in 09/2018 with some concern for inflammatory arthritis related to IBD. He was planning on starting humira for PG per dermatology at that visit. However, both remicade and humira subsequently denied by insurance.     Interim history:  Presents today for f/u.   Still getting aches in b/l knees, L wrist. Also low back pain. No sciatica. Better with combo of rest and activity. Not waking up with back pain. AM stiffness lasting several hours. No paresthesias.   Knee pain better with wrist. Swelling in the legs due to veins. Pain comes and goes, worse if on feet for a long time.  Some swelling in the L wrist, hx of injury there, shattered the bone and had to have a plate and screws placed there.  Additionally has intermittent swelling and tingling in b/l hands, worse on L. Seems to come on when holding things.      He also feels that his UC has been more active. Had colonoscopy in June, no overt inflammation on colonoscopy, but one of the biopsies showed mildly active chronic colitis.   He states his skin has been not bad. Has one ulcerated lesion on his leg that he thinks is active, but the rest seem to be healing over.     He mentions that when he was taking 60 mg prednisone, his joint pain was better, however, he is not sure if he has had any improvement in joint pain at lower doses of prednisone.       CURRENT MEDICATIONS:  Current Outpatient Medications   Medication Sig Dispense Refill   ??? acetaminophen (TYLENOL) 500 MG tablet Take 1,000 mg by mouth.     ??? cetirizine (ZYRTEC) 10 MG tablet Take 10 mg by mouth.     ??? dapsone 7.5 % GlwP Apply 1 application topically two (2) times a day. 1 Bottle 0   ??? doxycycline (VIBRA-TABS) 100 MG tablet/capsule Take 1 each (  100 mg total) by mouth Two (2) times a day. 60 each 1   ??? fluticasone (FLONASE) 50 mcg/actuation nasal spray      ??? hydrocortisone 2.5 % cream Apply 1 application topically Two (2) times a day. To face for itch 453.6 g 1   ??? ibuprofen (ADVIL,MOTRIN) 200 MG tablet Take 400-600 mg by mouth.     ??? metFORMIN (GLUCOPHAGE) 1000 MG tablet Take 1,000 mg by mouth two (2) times a day.     ??? mupirocin (BACTROBAN) 2 % ointment Apply topically Three (3) times a day. To affected area till healed 15 g 1   ??? peg-electrolyte soln (GOLYTELY) 420 gram SolR May substitute with GaviLyte G Nulytely TriLyte Take as directed. 1 Bottle 0   ??? SANTYL ointment      ??? triamcinolone (KENALOG) 0.1 % cream Apply topically Two (2) times a day. Apply twice daily to scaly spots on legs. 454 g 1   ??? venlafaxine (EFFEXOR-XR) 150 MG 24 hr capsule Take 150 mg by mouth.     ??? metFORMIN (GLUCOPHAGE) 500 MG tablet Take 500 mg by mouth.     ??? mupirocin (BACTROBAN) 2 % ointment Apply topically Two (2) times a day. Mix with clobetasol and apply twice daily (Patient not taking: Reported on 12/28/2018) 60 g 2     No current facility-administered medications for this visit.        Past Medical History:   Diagnosis Date   ??? Diabetes mellitus (CMS-HCC)         Record Review: Available records were reviewed, including pertinent office visits, labs, and imaging.      REVIEW OF SYSTEMS: Ten system were reviewed and negative except as noted above.    PHYSICAL EXAM:  VITAL SIGNS:   Vitals:    01/05/19 1549   BP: 128/76   Weight: (!) 157.4 kg (347 lb)     General:   Pleasant 37 y.o.male in no acute distress, WDWN   Lungs:  Clear to auscultation.Normal respiratory effort.    Musculoskeletal:   Hands: No overt swelling, able to make a tight fist b/l   Wrists:Reduced ROM of the L. FROM of the R.   Unable to assess knees or spine because patient is sitting in a car. Has on long pants and states he cannot show me his knees.    Psych:  Appropriate affect and mood   Skin:  Pt has on long pants, and unable to show me the skin on his legs.          ASSESSMENT/PLAN:  1. Arthralgia in the setting of PG and UC  Still unclear if his sx are due to mechanical pain vs spondyloarthritis. 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 The knee and low back pain, though are unclear. No clear indication that he needs anti-TNF tx for his arthralgias, but if he needs this for his PG or UC and we are able to more easily obtain this through our clinic, would be happy to write this for him. Mssg sent to his GI and Derm providers today, as well as Dr Sullivan Lone, discussing this. Will await response regarding whether he would benefit from humira for his PG or UC. If not needed for these conditions, consider checking imaging of the spine to eval for spondyloarthritis.   Offered voltaren gel to use on knees and wrist, but he declines because he is bad about using creams.      Return appointment in  6 mo with Dr Sullivan Lone or sooner if needed.

## 2019-01-07 ENCOUNTER — Other Ambulatory Visit: Payer: Self-pay | Admitting: Primary Care

## 2019-01-07 DIAGNOSIS — E119 Type 2 diabetes mellitus without complications: Secondary | ICD-10-CM

## 2019-01-07 NOTE — Telephone Encounter (Signed)
Last prescribed on 10/29/2017. Last A1C on 06/17/2018 Last appointment on 09/07/2018 with DrCopland. No future appointment

## 2019-01-07 NOTE — Telephone Encounter (Signed)
Noted. Please find out if patient is still taking Metformin now that he is off of steroids per dermatology. If he is still taking then needs office visit, please schedule. Then refill metformin for 90 day supply, no refills.

## 2019-01-08 NOTE — Unmapped (Signed)
Received word back from both GI and dermatology.   From GI perspective, his disease is mild, and does not need humira.  From the dermatology perspective, PG appeared to be doing well at last visit, but historically has had periods of stable disease with intermittent severe flares that are difficult to control and have prolonged healing. Dermatology not sure yet if he would need humira from the perspective of his PG at this time, but will re-evaluate at his upcoming appt with them next month. Will await their eval next month, and if it appears he needs humira for PG, will attempt to obtain it then. Mychart mssg sent to pt discussing this.

## 2019-01-14 ENCOUNTER — Other Ambulatory Visit: Payer: Self-pay | Admitting: Primary Care

## 2019-01-14 DIAGNOSIS — E119 Type 2 diabetes mellitus without complications: Secondary | ICD-10-CM

## 2019-01-15 NOTE — Progress Notes (Signed)
[name] ([id number]) Visit Report for 09/07/2018 Chief Complaint Document Details Patient Name: [name], [name] Date of Service: 09/07/2018 3:45 PM Medical Record Number: [id number] Patient Account Number: [id number] Date of Birth/Sex: 09/05/1980 (38 y.o. M) Treating RN: Harold Barban Primary Care Provider: [name] Other Clinician: Referring Provider: [name] Treating Provider/Extender: Melburn Hake, [place] Ku[place] Weeks in Treatment: 26 Information Obtained from: Patient Chief Complaint He is here in follow up evaluation for LLE pyoderma ulcer Electronic Signature(s) Signed: 09/07/2018 11:56:08 PM By: Worthy Keeler PA-C Entered By: Worthy Keeler on 09/07/2018 16:15:15 [name] ([id number]) -------------------------------------------------------------------------------- HPI Details Patient Name: [name] Date of Service: 09/07/2018 3:45 PM Medical Record Number: [id number] Patient Account Number: [id number] Date of Birth/Sex: 10-20-1980 (37 y.o. M) Treating RN: Harold Barban Primary Care Provider: [name] Other Clinician: Referring Provider: [name] Treating Provider/Extender: Melburn Hake, [place] Massman Weeks in Treatment: 8 History of Present Illness HPI Description: 12/04/16; 38 year old man who comes into the clinic today for review of a wound on the posterior left calf. He tells me that is been there for about a year. He is not a diabetic he does smoke half a pack per day. He was seen in the ER on 11/20/16 felt to have cellulitis around the wound and was given clindamycin. An x-ray did not show osteomyelitis. The patient initially tells me that he has a milk allergy that sets off a pruritic itching rash on his lower legs which she scratches incessantly and he thinks that's what may have set up the wound. He has been using various topical antibiotics and ointments without any effect. He works in a [employer] and is on his feet all day. He does not have a  prior history of wounds however he does have the rash on both lower legs the right arm and the ventral aspect of his left arm. These are excoriations and clearly have had scratching however there are of macular looking areas on both legs including a substantial larger area on the right leg. This does not have an underlying open area. There is no blistering. The patient tells me that 2 years ago in [place] in response to the rash on his legs he saw a dermatologist who told him he had a condition which may be pyoderma gangrenosum although I may be putting words into his mouth. He seemed to recognize this. 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 He is not in any treatment for this. He's had no recent travel 12/11/16; the patient arrives today with his wound and roughly the same condition we've been using silver alginate this is a deep punched out wound with some surrounding erythema but no tenderness. Biopsy I did did not show confirmed pyoderma gangrenosum suggested nonspecific inflammation and vasculitis but does not provide an actual description of what was seen by the pathologist. I'm really not able to understand this We have also received information from the patient's dermatologist in [place] notes from April 2016. This was a doctor Agarwal- antal. 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 He was prescribed topical steroid high potency under occlusion which helped but at this point the patient did not have a deep punched out wound. 12/18/16; the patient's wound is larger in terms of surface area however this surface looks better and there is less depth. The surrounding erythema also is better. The patient states that the wrap we put on came off 2 days ago when he has been using  his compression stockings. He we are in the process of getting a dermatology consult. 12/26/16 on evaluation today patient's left lower extremity wound shows evidence  of infection with surrounding erythema noted. He has been tolerating the dressing changes but states that he has noted more discomfort. There is a larger area of erythema surrounding the wound. No fevers, chills, nausea, or vomiting noted at this time. With that being said the wound still does have slough covering the surface. He is not allergic to any medication that he is aware of at this point. In regard to his right lower extremity he had several regions that are erythematous and pruritic he wonders if there's anything we can do to help that. 01/02/17 I reviewed patient's wound culture which was obtained his visit last week. He was placed on doxycycline at that point. Unfortunately that does not appear to be an antibiotic that would likely help with the situation however the pseudomonas noted on culture is sensitive to Cipro. Also unfortunately patient's wound seems to have a large compared to last week's evaluation. Not severely so but there are definitely increased measurements in general. He is continuing to have discomfort as well he writes this to be a seven out of 10. In fact he would prefer me not to perform any debridement today due to the fact that he is having discomfort and considering he has an active infection on the little reluctant to do so anyway. No fevers, chills, nausea, or vomiting noted at this time. 01/08/17; patient seems dermatology on September 5. I suspect dermatology will want the slides from the biopsy I did sent to their pathologist. I'm not sure if there is a way we can expedite that. In any case the culture I did before I left on vacation 3 weeks ago showed Pseudomonas he was given 10 days of Cipro and per her description of her intake nurses is actually somewhat better this week although the wound is quite a bit bigger than I remember the last time I saw this. He still has 3 more days of Cipro [name] ([id number]) 01/21/17; dermatology appointment tomorrow. He  has completed the ciprofloxacin for Pseudomonas. Surface of the wound looks better however he is had some deterioration in the lesions on his right leg. Meantime the left lateral leg wound we will continue with sample 01/29/17; patient had his dermatology appointment but I can't yet see that note. He is completed his antibiotics. The wound is more superficial but considerably larger in circumferential area than when he came in. This is in his left lateral calf. He also has swollen erythematous areas with superficial wounds on the right leg and small papular areas on both arms. There apparently areas in her his upper thighs and buttocks I did not look at those. Dermatology biopsied the right leg. Hopefully will have their input next week. 02/05/17; patient went back to see his dermatologist who told him that he had a "scratching problem" as well as staph. He is now on a 30 day course of doxycycline and I believe she gave him triamcinolone cream to the right leg areas to help with the itching [not exactly sure but probably triamcinolone]. She apparently looked at the left lateral leg wound although this was not rebiopsied and I think felt to be ultimately part of the same pathogenesis. He is using sample border foam and changing nevus himself. He now has a new open area on the right posterior leg which was his biopsy site I don't  have any of the dermatology notes 02/12/17; we put the patient in compression last week with SANTYL to the wound on the left leg and the biopsy. Edema is much better and the depth of the wound is now at level of skin. Area is still the same oBiopsy site on the right lateral leg we've also been using santyl with a border foam dressing and he is changing this himself. 02/19/17; Using silver alginate started last week to both the substantial left leg wound and the biopsy site on the right wound. He is tolerating compression well. Has a an appointment with his primary M.D. tomorrow  wondering about diuretics although I'm wondering if the edema problem is actually lymphedema 02/26/17; the patient has been to see his primary doctor Dr. Jerrel Ivory at [place] our primary care. She started him on Lasix 20 mg and this seems to have helped with the edema. However we are not making substantial change with the left lateral calf wound and inflammation. The biopsy site on the right leg also looks stable but not really all that different. 03/12/17; the patient has been to see vein and vascular Dr. Lucky Cowboy. He has had venous reflux studies I have not reviewed these. I did get a call from his dermatology office. 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 They therefore recommended clobetasol and tetracycline i.e. 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 03/26/17; apparently the patient just had reflux studies not an appointment with Dr. dew. She arrives in clinic today having applied clobetasol for 2-3 weeks. He notes over the last 2-3 days excessive drainage having to change the dressing 3-4 times a day and also expanding erythema. 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 Lillia Carmel at [facility] dermatology locally who kindly saw him at my request. 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 My concern was the degree of erythema medially extending from his left leg wound which was either inflammation from  pyoderma or cellulitis. I put him on Augmentin however culture of the wound showed Pseudomonas which is quinolone sensitive. I really don't believe he has cellulitis however in view of everything I will continue and give him a course of Cipro. He is also on doxycycline as an immune modulator for the pyoderma. In addition to his original wound on the left lateral leg with surrounding erythema he has a wound on the right posterior calf which was an original biopsy site done by dermatology. 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 Saw Dr. Lillia Carmel yesterday. He has been using topical antibiotics to both wound areas his original wound on the left and the biopsies/pathergy area on the right. There is definitely some improvement in the inflammation around the wound on the right although the patient states he has increasing sensitivity of the wounds. He is on prednisone 60 and doxycycline 1 as prescribed by Dr. Lillia Carmel. He is covering the topical  antibiotic with gauze and putting this in his own compression stocks and changing this daily. He states that Dr. Lottie Rater did a culture of the left leg wound yesterday 05/07/17; pyoderma gangrenosum. The patient saw Dr. Lillia Carmel yesterday and has a follow-up with her in one month. He is still using topical antibiotics to both wounds although he can't recall exactly what type. He is still on prednisone 60 mg. Dr. Lillia Carmel stated that the doxycycline could stop if we were in agreement. 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 The right medial leg was induced by biopsy/pathergy. The area on the right is essentially healed. 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 The right medial leg has closed and  remains closed. He is still on prednisone 60. oHe tells me he missed his last dermatology appointment with Dr. Lillia Carmel but will make another appointment. He reports that her blood sugar at a recent screen in [place] was high 200's. He was 180 today. He is more cushingoid blood pressure is [name], [name] ([id number]) up a bit. I think he is going to require still much longer prednisone perhaps another 3 months before attempting to taper. In the meantime his wound is a lot better. Smaller. He is cleaning this off daily and applying topical antibiotics. When he was last in the clinic I thought about changing to [facility] and actually put in a couple of calls to dermatology although probably not during their business hours. 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 He continues on oral prednisone. He has been using triple antibiotic ointment. There is surface debris and we will transition to [facility] and have him return in 2 weeks. He has lost 30 pounds since his last appointment with lifestyle modification. He may benefit from topical steroid cream for treatment this can be considered at a later date. 08/22/17 on evaluation today patient appears to actually be doing rather well in regard to his left lateral lower extremity ulcer. He has actually been managed by Dr. Dellia Nims most recently. Patient is currently on oral steroids at this time. This seems to have been of benefit for him. Nonetheless his last visit was actually with Leah on 08/06/17. Currently he is not utilizing any topical steroid creams although this could be of benefit as well. No fevers, chills, nausea, or vomiting noted at this time. 09/05/17 on evaluation today patient appears to be doing better in regard to his left lateral lower extremity ulcer. He has been tolerating the dressing changes without complication.  He is using Santyl with good effect. Overall I'm very pleased with how things are standing at this point. Patient likewise is happy that this is doing better. 09/19/17 on evaluation today patient actually appears to be doing rather well in regard to his left lateral lower extremity ulcer. 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 He's not having any significant pain. 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 He has been tolerating the Santyl without complication and in general I feel like he's making good  progress. 10/17/17 on evaluation today patient appears to be doing very well in regard to his left lateral lower surety ulcer. He has been tolerating the dressing changes without complication. There does not appear to be any evidence of infection he's alternating the Santyl and the triple antibiotic ointment every other day this seems to be doing well for him. 11/03/17 on evaluation today patient appears to be doing very well in regard to his left lateral lower extremity ulcer. He is been tolerating the dressing changes without complication which is good news. Fortunately there does not appear to be any evidence of infection which is also great news. Overall is doing excellent they are starting to taper down on the prednisone is down to 40 mg at this point it also started topical clobetasol for him. 11/17/17 on evaluation today patient appears to be doing well in regard to his left lateral lower surety ulcer. He's been tolerating the dressing changes without complication. He does note that he is having no pain, no excessive drainage or discharge, and overall he feels like things are going about how he would expect and hope they would. Overall he seems to have no evidence of infection at this time in my opinion which is  good news. 12/04/17-He is seen in follow-up evaluation for right lateral lower extremity ulcer. He has been applying topical steroid cream. Today's measurement show slight increase in size. Over the next 2 weeks we will transition to every other day Santyl and steroid cream. He has been encouraged to monitor for changes and notify clinic with any concerns 12/15/17 on evaluation today patient's left lateral motion the ulcer and fortunately is doing worse again at this point. This just since last week to this week has close to doubled in size according to the patient. I did not seeing last week's I do not have a visual to compare this to in our system was also down so we do not have all the charts and at this point. Nonetheless it does have me somewhat concerned in regard to the fact that again he was worried enough about it he has contact the dermatology that placed them back on the full strength, 50 mg a day of the prednisone that he was taken previous. He continues to alternate using clobetasol along with Santyl at this point. He is obviously somewhat frustrated. 12/22/17 on evaluation today patient appears to be doing a little worse compared to last evaluation. Unfortunately the wound is a little deeper and slightly larger than the last week's evaluation. With that being said he has made some progress in regard to the irritation surrounding at this time unfortunately despite that progress that's been made he still has a significant issue going on here. 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 For that reason I discussed with him today the possibility of perform a wound culture to ensure there's nothing overtly infected. 01/06/18 on evaluation today patient's wound is larger and deeper than previously evaluated. With that being said it did appear that  his wound was infected after my last evaluation with him. Subsequently I did end up prescribing a prescription for Bactrim DS which she has been taking and having no complication with. Fortunately there does not appear to be any evidence of [name], [name] ([id number]) infection at this point in time as far as anything spreading, no want to  touch, and overall I feel like things are showing signs of improvement. 01/13/18 on evaluation today patient appears to be even a little larger and deeper than last time. There still muscle exposed in the base of the wound. Nonetheless he does appear to be less erythematous I do believe inflammation is calming down also believe the infection looks like it's probably resolved at this time based on what I'm seeing. No fevers, chills, nausea, or vomiting noted at this time. 01/30/18 on evaluation today patient actually appears to visually look better for the most part. Unfortunately those visually this looks better he does seem to potentially have what may be an abscess in the muscle that has been noted in the central portion of the wound. This is the first time that I have noted what appears to be fluctuance in the central portion of the muscle. With that being said I'm somewhat more concerned about the fact that this might indicate an abscess formation at this location. I do believe that an ultrasound would be appropriate. This is likely something we need to try to do as soon as possible. He has been switch to mupirocin ointment and he is no longer using the steroid ointment as prescribed by dermatology he sees them again next week he's been decreased from 60 to 40 mg of prednisone. 03/09/18 on evaluation today patient actually appears to be doing a little better compared to last time I saw him. There's not as much erythema surrounding the wound itself. He I did review his most recent infectious disease note which was dated 02/24/18. He saw Dr. Michel Bickers in  [place]. 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 Patient is now off of antibiotics and they are continually observing this. There seems to be no change in the past two weeks in my pinion based on what the patient says and what I see today compared to what Dr. Megan Salon likely saw two weeks ago. No fevers, chills, nausea, or vomiting noted at this time. 03/23/18 on evaluation today patient's wound actually appears to be showing signs of improvement which is good news. He is currently still on the Dapsone. He is also working on tapering the prednisone to get off of this and Dr. Lottie Rater is working with him in this regard. Nonetheless overall I feel like the wound is doing well it does appear based on the infectious disease note that I reviewed from Dr. 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 I did also review the results of his ultrasound of left lower extremity which revealed there was a dentist tissue in the base of the wound without an abscess noted. 04/06/18 on evaluation today the patient's left lateral lower extremity ulcer actually appears to be doing fairly well which is excellent news. There does not appear to be any evidence of infection at this time which is also great news. Overall he still does have a significantly large ulceration although little by little he seems to be making progress. He is down to 10 mg a day of the prednisone. 04/20/18 on evaluation today patient actually appears to be doing excellent at this time in regard to his left lower extremity ulcer. He's making signs of good progress unfortunately this is taking much longer than we would really like  to see but nonetheless he is making progress. Fortunately there does not appear to be any evidence of infection at this  time. No fevers, chills, nausea, or vomiting noted at this time. The patient has not been using the Santyl due to the cost he hadn't got in this field yet. He's mainly been using the antibiotic ointment topically. Subsequently he also tells me that he really has not been scrubbing in the shower I think this would be helpful again as I told him it doesn't have to be anything too aggressive to even make it believe just enough to keep it free of some of the loose slough and biofilm on the wound surface. 05/11/18 on evaluation today patient's wound appears to be making slow but sure progress in regard to the left lateral lower extremity ulcer. He is been tolerating the dressing changes without complication. Fortunately there does not appear to be any evidence of infection at this time. He is still just using triple antibiotic ointment along with clobetasol occasionally over the area. He never got the Santyl and really does not seem to intend to in my pinion. 06/01/18 on evaluation today patient appears to be doing a little better in regard to his left lateral lower extremity ulcer. He states that overall he does not feel like he is doing as well with the Dapsone as he did with the prednisone. Nonetheless he sees his dermatologist later today and is gonna talk to them about the possibility of going back on the prednisone. Overall again I believe that the wound would be better if you would utilize Santyl but he really does not seem to be interested in going back to the [place] at this point. He has been using triple antibiotic ointment. 06/15/18 on evaluation today patient's wound actually appears to be doing about the same at this point. Fortunately there is no signs of infection at this time. He has made slight improvements although he continues to not really want to clean the wound bed at this point. He states that he just doesn't mess with it he doesn't want to cause any problems with everything else  he has going on. He has been on medication, antibiotics as prescribed by his dermatologist, for a staff infection of his lower extremities which is really drying out now and looking much better he tells me. Fortunately there is no sign of overall infection. [name], [name] ([id number]) 06/29/18 on evaluation today patient appears to be doing well in regard to his left lateral lower surety ulcer all things considering. Fortunately his staff infection seems to be greatly improved compared to previous. He has no signs of infection and this is drying up quite nicely. He is still the doxycycline for this is no longer on cental, Dapsone, or any of the other medications. His dermatologist has recommended possibility of an infusion but right now he does not want to proceed with that. 07/13/18 on evaluation today patient appears to be doing about the same in regard to his left lateral lower surety ulcer. Fortunately there's no signs of infection at this time which is great news. Unfortunately he still builds up a significant amount of Slough/biofilm of the surface of the wound he still is not really cleaning this as he should be appropriately. Again I'm able to easily with saline and gauze remove the majority of this on the surface which if you would do this at home would likely be a dramatic improvement for him as far as getting the area  to improve. Nonetheless overall I still feel like he is making progress is just very slow. I think Santyl will be of benefit for him as well. Still he has not gotten this as of this point. 07/27/18 on evaluation today patient actually appears to be doing little worse in regards of the erythema around the periwound region of the wound he also tells me that he's been having more drainage currently compared to what he was experiencing last time I saw him. He states not quite as bad as what he had because this was infected previously but nonetheless is still appears to be doing  poorly. Fortunately there is no evidence of systemic infection at this point. The patient tells me that he is not going to be able to afford the Santyl. He is still waiting to hear about the infusion therapy with his dermatologist. Apparently she wants an updated colonoscopy first. 08/10/18 on evaluation today patient appears to be doing better in regard to his left lateral lower extremity ulcer. Fortunately he is showing signs of improvement in this regard he's actually been approved for Remicade infusion's as well although this has not been scheduled as of yet. Fortunately there's no signs of active infection at this time in regard to the wound although he is having some issues with infection of the right lower extremity is been seen as dermatologist for this. Fortunately they are definitely still working with him trying to keep things under control. 09/07/18 on evaluation today patient is actually doing rather well in regard to his left lateral lower extremity ulcer. He notes these actually having some hair grow back on his extremity which is something he has not seen in years. He also tells me that the pain is really not giving them any trouble at this time which is also good news overall she is very pleased with the progress he's using a combination of the mupirocin along with the probate is all mixed. Electronic Signature(s) Signed: 09/07/2018 11:56:08 PM By: Worthy Keeler PA-C Entered By: Worthy Keeler on 09/07/2018 23:45:26 EMAAD, [name] ([id number]) -------------------------------------------------------------------------------- Physical Exam Details Patient Name: [name] Date of Service: 09/07/2018 3:45 PM Medical Record Number: [id number] Patient Account Number: [id number] Date of Birth/Sex: November 29, 1980 (37 y.o. M) Treating RN: Harold Barban Primary Care Provider: [name] Other Clinician: Referring Provider: [name] Treating Provider/Extender: Melburn Hake,  Bogdan Vivona Weeks in Treatment: 46 Constitutional Obese and well-hydrated in no acute distress. Respiratory normal breathing without difficulty. clear to auscultation bilaterally. Cardiovascular regular rate and rhythm with normal S1, S2. Psychiatric this patient is able to make decisions and demonstrates good insight into disease process. Alert and Oriented x 3. pleasant and cooperative. Notes Patient's wound bed shows signs of good granulation at this time although there was some [facility] noted still I feel like he's making good progress. No fevers, chills, nausea, or vomiting noted at this time. Electronic Signature(s) Signed: 09/07/2018 11:56:08 PM By: Worthy Keeler PA-C Entered By: Worthy Keeler on 09/07/2018 23:46:03 [name], [name] ([id number]) -------------------------------------------------------------------------------- Physician Orders Details Patient Name: [name] Date of Service: 09/07/2018 3:45 PM Medical Record Number: [id number] Patient Account Number: [id number] Date of Birth/Sex: Dec 24, 1980 (37 y.o. 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 Anesthetic (add to Medication List) Wound #1 Left,Lateral Lower Leg o Topical Lidocaine 4% cream applied to wound bed prior to debridement (In Clinic Only). Primary Wound Dressing Wound #1 Left,Lateral Lower Leg o Other: - Mupirocin mix half and half with clobetasol Secondary Dressing Wound #1 Left,Lateral Lower Leg o Boardered Foam  Dressing Dressing Change Frequency Wound #1 Left,Lateral Lower Leg o Change dressing every day. Follow-up Appointments Wound #1 Left,Lateral Lower Leg o Return Appointment in 2 weeks. Edema Control Wound #1 Left,Lateral Lower Leg o Patient to wear own compression stockings o Elevate legs to the level of the heart and pump ankles as often as possible Medications-please add to medication list. Wound #1 Left,Lateral Lower Leg o P.O. Antibiotics - Prescribed by Dermatology for Staph Infection NICHALOS, BRENTON ([id number]) Electronic Signature(s) Signed: 09/07/2018 11:56:08 PM By: Worthy Keeler PA-C Signed: 01/15/2019 1:03:35 PM By: Harold Barban Entered By: Harold Barban on 09/07/2018 16:50:51 AKAI, DOLLARD ([id number]) -------------------------------------------------------------------------------- Problem List Details Patient Name: [name] Date of Service: 09/07/2018 3:45 PM Medical Record Number: [id number] Patient Account Number: [id number] Date of Birth/Sex: 12-08-1980 (37 y.o. M) Treating RN: Harold Barban Primary Care Provider: [name] Other Clinician: Referring Provider: [name] Treating Provider/Extender: Melburn Hake, Orvel Cutsforth Weeks in Treatment: 61 Active Problems ICD-10 Evaluated Encounter Code Description Active Date Today Diagnosis L97.222 Non-pressure chronic ulcer of left calf with fat layer exposed 12/04/2016 No Yes L88 Pyoderma gangrenosum 03/26/2017 No Yes I87.2 Venous insufficiency (chronic) (peripheral) 12/04/2016 No Yes Inactive Problems ICD-10 Code Description Active Date Inactive Date L97.213 Non-pressure chronic ulcer of right calf with necrosis of muscle 04/02/2017 04/02/2017 Resolved Problems Electronic Signature(s) Signed: 09/07/2018 11:56:08 PM By: Worthy Keeler PA-C Entered By: Worthy Keeler on 09/07/2018 16:15:10 Sinor, Herbie [name]  ([id number]) -------------------------------------------------------------------------------- Progress Note Details Patient Name: [name] Date of Service: 09/07/2018 3:45 PM Medical Record Number: [id number] Patient Account Number: [id number] Date of Birth/Sex: [birth year] (37 y.o. M) Treating RN: Harold Barban Primary Care Provider: [name] Other Clinician: Referring Provider: [name] Treating Provider/Extender: Melburn Hake, Dayshon Roback Weeks in Treatment: 41 Subjective Chief Complaint Information obtained from Patient He is here in follow up evaluation for LLE pyoderma ulcer History of Present Illness (HPI) 12/04/16; 38 year old man who comes into the clinic today for review of a wound on the posterior left calf. He tells me that is been there for about a year. He is not a diabetic he does smoke half a pack per day. He was seen in the ER on 11/20/16 felt to have cellulitis around the wound and was given clindamycin. An x-ray did not show osteomyelitis. The patient initially tells me that he has a milk allergy that sets off a pruritic itching rash on his lower legs which she scratches incessantly and he thinks that's what may have set up the wound. He has been using various topical antibiotics and ointments without any effect. He works in a [employer] and is on his feet all day. He does not have a prior history of wounds however he does have the rash on both lower legs the right arm and the ventral aspect of his left arm. These are excoriations and clearly have had scratching however there are of macular looking areas on both legs including a substantial larger area on the right leg. This does not have an underlying open area. There is no blistering. The patient tells me  that 2 years ago in [place] in response to the rash on his legs he saw a dermatologist who told him he had a condition which may be pyoderma gangrenosum although I may be putting words into his mouth. He seemed to  recognize this. 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 He is not in any treatment for this. He's had no recent travel 12/11/16; the patient arrives today with his wound and roughly the same condition we've been using silver alginate this is a deep punched out wound with some surrounding erythema but no tenderness. Biopsy I did did not show confirmed pyoderma gangrenosum suggested nonspecific inflammation and vasculitis but does not provide an actual description of what was seen by the pathologist. I'm really not able to understand this We have also received information from the patient's dermatologist in [place] notes from April 2016. This was a doctor Agarwal- antal. 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 He was prescribed topical steroid high potency under occlusion which helped but at this point the patient did not have a deep punched out wound. 12/18/16; the patient's wound is larger in terms of surface area however this surface looks better and there is less depth. The surrounding erythema also is better. The patient states that the wrap we put on came off 2 days ago when he has been using his compression stockings. He we are in the process of getting a dermatology consult. 12/26/16 on evaluation today patient's left lower extremity wound shows evidence of infection with surrounding erythema noted. He has been tolerating the dressing changes but states that he has noted more discomfort. There is a larger area of erythema surrounding the wound. No fevers, chills, nausea, or vomiting noted at this time. With that being said the wound still does have slough covering the surface. He is not allergic to any medication that he is aware of at this point. In regard to his right lower extremity he had several regions that are erythematous and pruritic he wonders if there's anything we can do to help that. 01/02/17 I reviewed patient's wound  culture which was obtained his visit last week. He was placed on doxycycline at that point. Unfortunately that does not appear to be an antibiotic that would likely help with the situation however the pseudomonas noted on culture is sensitive to Cipro. Also unfortunately patient's wound seems to have a large compared to last week's evaluation. Not severely so but there are definitely increased measurements in general. He is continuing to have discomfort as well he writes this to be a seven out of 10. In fact he would prefer me not to perform any debridement today due to the fact that he is having discomfort and considering he has an active infection on the little reluctant to do so anyway. No fevers, Roads, Tamara ([id number]) chills, nausea, or vomiting noted at this time. 01/08/17; patient seems dermatology on September 5. I suspect dermatology will want the slides from the biopsy I did sent to their pathologist. I'm not sure if there is a way we can expedite that. In any case the culture I did before I left on vacation 3 weeks ago showed Pseudomonas he was given 10 days of Cipro and per her description of her intake nurses is actually somewhat better this week although the wound is quite a bit bigger than I remember the last time I saw this. He still has 3 more days of Cipro 01/21/17;  dermatology appointment tomorrow. He has completed the ciprofloxacin for Pseudomonas. Surface of the wound looks better however he is had some deterioration in the lesions on his right leg. Meantime the left lateral leg wound we will continue with sample 01/29/17; patient had his dermatology appointment but I can't yet see that note. He is completed his antibiotics. The wound is more superficial but considerably larger in circumferential area than when he came in. This is in his left lateral calf. He also has swollen erythematous areas with superficial wounds on the right leg and small papular areas on both arms.  There apparently areas in her his upper thighs and buttocks I did not look at those. Dermatology biopsied the right leg. Hopefully will have their input next week. 02/05/17; patient went back to see his dermatologist who told him that he had a "scratching problem" as well as staph. He is now on a 30 day course of doxycycline and I believe she gave him triamcinolone cream to the right leg areas to help with the itching [not exactly sure but probably triamcinolone]. She apparently looked at the left lateral leg wound although this was not rebiopsied and I think felt to be ultimately part of the same pathogenesis. He is using sample border foam and changing nevus himself. He now has a new open area on the right posterior leg which was his biopsy site I don't have any of the dermatology notes 02/12/17; we put the patient in compression last week with SANTYL to the wound on the left leg and the biopsy. Edema is much better and the depth of the wound is now at level of skin. Area is still the same Biopsy site on the right lateral leg we've also been using santyl with a border foam dressing and he is changing this himself. 02/19/17; Using silver alginate started last week to both the substantial left leg wound and the biopsy site on the right wound. He is tolerating compression well. Has a an appointment with his primary M.D. tomorrow wondering about diuretics although I'm wondering if the edema problem is actually lymphedema 02/26/17; the patient has been to see his primary doctor Dr. Jerrel Ivory at [place] our primary care. She started him on Lasix 20 mg and this seems to have helped with the edema. However we are not making substantial change with the left lateral calf wound and inflammation. The biopsy site on the right leg also looks stable but not really all that different. 03/12/17; the patient has been to see vein and vascular Dr. Lucky Cowboy. He has had venous reflux studies I have not reviewed these. I  did get a call from his dermatology office. 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 They therefore recommended clobetasol and tetracycline i.e. 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 03/26/17; apparently the patient just had reflux studies not an appointment with Dr. dew. She arrives in clinic today having applied clobetasol for 2-3 weeks. He notes over the last 2-3 days excessive drainage having to change the dressing 3-4 times a day and also expanding erythema. He states the expanding erythema seems to come and go and was last this red was earlier in the month.he is on doxycycline 150 mg twice  a day as an anti-inflammatory systemic therapy for possible pyoderma gangrenosum along with the topical clobetasol 04/02/17; the patient was seen last week by Dr. Lillia Carmel at [facility] dermatology locally who kindly saw him at my request. 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 My concern was the degree of erythema medially extending from his left leg wound which was either inflammation from pyoderma or cellulitis. I put him on Augmentin however culture of the wound showed Pseudomonas which is quinolone sensitive. I really don't believe he has cellulitis however in view of everything I will continue and give him a course of Cipro. He is also on doxycycline as an immune modulator for the pyoderma. In addition to his original wound on the left lateral leg with surrounding erythema he has a wound on the right posterior calf which was an original biopsy site done by dermatology. This was felt to represent pathergy from pyoderma gangrenosum 04/16/17; pyoderma  gangrenosum. Saw Dr. Lillia Carmel yesterday. He has been using topical antibiotics to both wound areas his original wound on the left and the biopsies/pathergy area on the right. There is definitely some improvement in the inflammation around the wound on the right although the patient states he has increasing sensitivity of the wounds. He is on prednisone 60 and doxycycline 1 as prescribed by Dr. Lillia Carmel. He is covering the topical antibiotic with gauze and putting this in his own compression stocks and changing this daily. He states that Dr. Lottie Rater did a culture of the left leg wound yesterday 05/07/17; pyoderma gangrenosum. The patient saw Dr. Lillia Carmel yesterday and has a follow-up with her in one month. He is still using topical antibiotics to both wounds although he can't recall exactly what type. He is still on prednisone 60 mg. Dr. Lillia Carmel stated that the doxycycline could stop if we were in agreement. 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 The right medial leg was induced by [name] ([id number]) biopsy/pathergy. The area on the right is essentially healed. 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 The right medial leg has closed and remains closed. He is still on prednisone 60. He tells me he missed his last dermatology appointment with Dr. Lillia Carmel but will make another appointment. He reports that her blood sugar at a recent screen in [place] was high 200's. He was 180 today. 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 I think he is going to require still much longer prednisone perhaps another 3 months before attempting to taper. In the meantime his wound is a lot better. Smaller. He is cleaning this off daily and applying topical antibiotics. When he was last  in the clinic I thought about changing to [facility] and actually put in a couple of calls to dermatology although probably not during their business hours. 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 He continues on oral prednisone. He has been using triple antibiotic ointment. There is surface debris and we will transition to [facility] and have him return in 2 weeks. He has lost 30 pounds since his last appointment with lifestyle modification. He may benefit from topical steroid cream for treatment  this can be considered at a later date. 08/22/17 on evaluation today patient appears to actually be doing rather well in regard to his left lateral lower extremity ulcer. He has actually been managed by Dr. Dellia Nims most recently. Patient is currently on oral steroids at this time. This seems to have been of benefit for him. Nonetheless his last visit was actually with Leah on 08/06/17. Currently he is not utilizing any topical steroid creams although this could be of benefit as well. No fevers, chills, nausea, or vomiting noted at this time. 09/05/17 on evaluation today patient appears to be doing better in regard to his left lateral lower extremity ulcer. He has been tolerating the dressing changes without complication. He is using Santyl with good effect. Overall I'm very pleased with how things are standing at this point. Patient likewise is happy that this is doing better. 09/19/17 on evaluation today patient actually appears to be doing rather well in regard to his left lateral lower extremity ulcer. 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 He's not having any significant pain. 10/03/17 on evaluation today patient appears to be doing excellent in regard to his lower extremity wound on the left secondary to  Pyoderma gangrenosum. He has been tolerating the Santyl without complication and in general I feel like he's making good progress. 10/17/17 on evaluation today patient appears to be doing very well in regard to his left lateral lower surety ulcer. He has been tolerating the dressing changes without complication. There does not appear to be any evidence of infection he's alternating the Santyl and the triple antibiotic ointment every other day this seems to be doing well for him. 11/03/17 on evaluation today patient appears to be doing very well in regard to his left lateral lower extremity ulcer. He is been tolerating the dressing changes without complication which is good news. Fortunately there does not appear to be any evidence of infection which is also great news. Overall is doing excellent they are starting to taper down on the prednisone is down to 40 mg at this point it also started topical clobetasol for him. 11/17/17 on evaluation today patient appears to be doing well in regard to his left lateral lower surety ulcer. He's been tolerating the dressing changes without complication. He does note that he is having no pain, no excessive drainage or discharge, and overall he feels like things are going about how he would expect and hope they would. Overall he seems to have no evidence of infection at this time in my opinion which is good news. 12/04/17-He is seen in follow-up evaluation for right lateral lower extremity ulcer. He has been applying topical steroid cream. Today's measurement show slight increase in size. Over the next 2 weeks we will transition to every other day Santyl and steroid cream. He has been encouraged to monitor for changes and notify clinic with any concerns 12/15/17 on evaluation today patient's left lateral motion the ulcer and fortunately is doing worse again at this point. This just since last week to this week has close to doubled in size according to the patient. I did  not seeing last week's I do not have a visual to compare this to in our system was also down so we do not have all the charts and at this point. Nonetheless it does have me somewhat concerned in regard to the fact that again he was worried enough  about it he has contact the dermatology that placed them back on the full strength, 50 mg a day of the prednisone that he was taken previous. He continues to alternate using clobetasol along with Santyl at this point. He is obviously somewhat frustrated. 12/22/17 on evaluation today patient appears to be doing a little worse compared to last evaluation. Unfortunately the wound is a little deeper and slightly larger than the last week's evaluation. With that being said he has made some progress in regard to the irritation surrounding at this time unfortunately despite that progress that's been made he still has a significant issue going on here. I'm not certain that he is having really any true infection at this time although with the Pyoderma gangrenosum it can [name], [name] ([id number]) sometimes be difficult to differentiate infection versus just inflammation. For that reason I discussed with him today the possibility of perform a wound culture to ensure there's nothing overtly infected. 01/06/18 on evaluation today patient's wound is larger and deeper than previously evaluated. With that being said it did appear that his wound was infected after my last evaluation with him. Subsequently I did end up prescribing a prescription for Bactrim DS which she has been taking and having no complication with. Fortunately there does not appear to be any evidence of infection at this point in time as far as anything spreading, no want to touch, and overall I feel like things are showing signs of improvement. 01/13/18 on evaluation today patient appears to be even a little larger and deeper than last time. There still muscle exposed in the base of the wound. Nonetheless  he does appear to be less erythematous I do believe inflammation is calming down also believe the infection looks like it's probably resolved at this time based on what I'm seeing. No fevers, chills, nausea, or vomiting noted at this time. 01/30/18 on evaluation today patient actually appears to visually look better for the most part. Unfortunately those visually this looks better he does seem to potentially have what may be an abscess in the muscle that has been noted in the central portion of the wound. This is the first time that I have noted what appears to be fluctuance in the central portion of the muscle. With that being said I'm somewhat more concerned about the fact that this might indicate an abscess formation at this location. I do believe that an ultrasound would be appropriate. This is likely something we need to try to do as soon as possible. He has been switch to mupirocin ointment and he is no longer using the steroid ointment as prescribed by dermatology he sees them again next week he's been decreased from 60 to 40 mg of prednisone. 03/09/18 on evaluation today patient actually appears to be doing a little better compared to last time I saw him. There's not as much erythema surrounding the wound itself. He I did review his most recent infectious disease note which was dated 02/24/18. He saw Dr. Michel Bickers in [place]. 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 Patient is now off of antibiotics and they are continually observing this. There seems to be no change in the past two weeks in my pinion based on what the patient says and what I see today compared to what Dr. Megan Salon likely saw two weeks ago. No fevers, chills, nausea, or vomiting noted at this time.  03/23/18 on evaluation today patient's wound actually appears to be showing signs of improvement which is good news. He is currently  still on the Dapsone. He is also working on tapering the prednisone to get off of this and Dr. Lottie Rater is working with him in this regard. Nonetheless overall I feel like the wound is doing well it does appear based on the infectious disease note that I reviewed from Dr. 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 I did also review the results of his ultrasound of left lower extremity which revealed there was a dentist tissue in the base of the wound without an abscess noted. 04/06/18 on evaluation today the patient's left lateral lower extremity ulcer actually appears to be doing fairly well which is excellent news. There does not appear to be any evidence of infection at this time which is also great news. Overall he still does have a significantly large ulceration although little by little he seems to be making progress. He is down to 10 mg a day of the prednisone. 04/20/18 on evaluation today patient actually appears to be doing excellent at this time in regard to his left lower extremity ulcer. He's making signs of good progress unfortunately this is taking much longer than we would really like to see but nonetheless he is making progress. Fortunately there does not appear to be any evidence of infection at this time. No fevers, chills, nausea, or vomiting noted at this time. The patient has not been using the Santyl due to the cost he hadn't got in this field yet. He's mainly been using the antibiotic ointment topically. Subsequently he also tells me that he really has not been scrubbing in the shower I think this would be helpful again as I told him it doesn't have to be anything too aggressive to even make it believe just enough to keep it free of some of the loose slough and biofilm on the wound surface. 05/11/18 on evaluation today patient's wound appears to be making slow but  sure progress in regard to the left lateral lower extremity ulcer. He is been tolerating the dressing changes without complication. Fortunately there does not appear to be any evidence of infection at this time. He is still just using triple antibiotic ointment along with clobetasol occasionally over the area. He never got the Santyl and really does not seem to intend to in my pinion. 06/01/18 on evaluation today patient appears to be doing a little better in regard to his left lateral lower extremity ulcer. He states that overall he does not feel like he is doing as well with the Dapsone as he did with the prednisone. Nonetheless he sees his dermatologist later today and is gonna talk to them about the possibility of going back on the prednisone. Overall again I believe that the wound would be better if you would utilize Santyl but he really does not seem to be interested in going back to the [place] at this point. He has been using triple antibiotic ointment. [name], [name] ([id number]) 06/15/18 on evaluation today patient's wound actually appears to be doing about the same at this point. Fortunately there is no signs of infection at this time. He has made slight improvements although he continues to not really want to clean the wound bed at this point. He states that he just doesn't mess with it he  doesn't want to cause any problems with everything else he has going on. He has been on medication, antibiotics as prescribed by his dermatologist, for a staff infection of his lower extremities which is really drying out now and looking much better he tells me. Fortunately there is no sign of overall infection. 06/29/18 on evaluation today patient appears to be doing well in regard to his left lateral lower surety ulcer all things considering. Fortunately his staff infection seems to be greatly improved compared to previous. He has no signs of infection and this is drying up quite nicely. He is still the  doxycycline for this is no longer on cental, Dapsone, or any of the other medications. His dermatologist has recommended possibility of an infusion but right now he does not want to proceed with that. 07/13/18 on evaluation today patient appears to be doing about the same in regard to his left lateral lower surety ulcer. Fortunately there's no signs of infection at this time which is great news. Unfortunately he still builds up a significant amount of Slough/biofilm of the surface of the wound he still is not really cleaning this as he should be appropriately. Again I'm able to easily with saline and gauze remove the majority of this on the surface which if you would do this at home would likely be a dramatic improvement for him as far as getting the area to improve. Nonetheless overall I still feel like he is making progress is just very slow. I think Santyl will be of benefit for him as well. Still he has not gotten this as of this point. 07/27/18 on evaluation today patient actually appears to be doing little worse in regards of the erythema around the periwound region of the wound he also tells me that he's been having more drainage currently compared to what he was experiencing last time I saw him. He states not quite as bad as what he had because this was infected previously but nonetheless is still appears to be doing poorly. Fortunately there is no evidence of systemic infection at this point. The patient tells me that he is not going to be able to afford the Santyl. He is still waiting to hear about the infusion therapy with his dermatologist. Apparently she wants an updated colonoscopy first. 08/10/18 on evaluation today patient appears to be doing better in regard to his left lateral lower extremity ulcer. Fortunately he is showing signs of improvement in this regard he's actually been approved for Remicade infusion's as well although this has not been scheduled as of yet. Fortunately there's  no signs of active infection at this time in regard to the wound although he is having some issues with infection of the right lower extremity is been seen as dermatologist for this. Fortunately they are definitely still working with him trying to keep things under control. 09/07/18 on evaluation today patient is actually doing rather well in regard to his left lateral lower extremity ulcer. He notes these actually having some hair grow back on his extremity which is something he has not seen in years. He also tells me that the pain is really not giving them any trouble at this time which is also good news overall she is very pleased with the progress he's using a combination of the mupirocin along with the probate is all mixed. Patient History Information obtained from Patient. Family History Diabetes - Mother,Maternal Grandparents, Heart Disease - Mother,Maternal Grandparents, Hereditary Spherocytosis - Siblings, Hypertension - Maternal Grandparents,  Stroke - Siblings, No family history of Cancer, Kidney Disease, Lung Disease, Seizures, Thyroid Problems, Tuberculosis. Social History Current some day smoker, Marital Status - Married, Alcohol Use - Rarely, Drug Use - No History, Caffeine Use - Moderate. Medical History Eyes Denies history of Cataracts, Glaucoma, Optic Neuritis Ear/Nose/Mouth/Throat Denies history of Chronic sinus problems/congestion, Middle ear problems Hematologic/Lymphatic Denies history of Anemia, Hemophilia, Human Immunodeficiency Virus, Lymphedema, Sickle Cell Disease Respiratory Patient has history of Sleep Apnea - cpap Denies history of Aspiration, Asthma, Chronic Obstructive Pulmonary Disease (COPD), Pneumothorax Cardiovascular Collister, Dreux ([id number]) Patient has history of Hypertension Denies history of Angina, Arrhythmia, Congestive Heart Failure, Coronary Artery Disease, Deep Vein Thrombosis, Hypotension, Myocardial Infarction, Peripheral Arterial Disease,  Peripheral Venous Disease, Phlebitis, Vasculitis Gastrointestinal Patient has history of Colitis Endocrine Denies history of Type I Diabetes, Type II Diabetes Genitourinary Denies history of End Stage Renal Disease Immunological Denies history of Lupus Erythematosus, Raynaud s, Scleroderma Integumentary (Skin) Denies history of History of Burn, History of pressure wounds Musculoskeletal Denies history of Gout, Rheumatoid Arthritis, Osteoarthritis, Osteomyelitis Neurologic Denies history of Dementia, Neuropathy, Quadriplegia, Paraplegia, Seizure Disorder Oncologic Denies history of Received Chemotherapy, Received Radiation Psychiatric Denies history of Anorexia/bulimia, Confinement Anxiety Review of Systems (ROS) Constitutional Symptoms (General Health) Denies complaints or symptoms of Fatigue, Fever, Chills, Marked Weight Change. Respiratory Denies complaints or symptoms of Chronic or frequent coughs, Shortness of Breath. Cardiovascular Denies complaints or symptoms of Chest pain, LE edema. Psychiatric Denies complaints or symptoms of Anxiety, Claustrophobia. Objective Constitutional Obese and well-hydrated in no acute distress. Vitals Time Taken: 4:08 PM, Height: 71 in, Weight: 338 lbs, BMI: 47.1, Temperature: 98.5 F, Pulse: 95 bpm, Respiratory Rate: 16 breaths/min, Blood Pressure: 136/92 mmHg. Respiratory normal breathing without difficulty. clear to auscultation bilaterally. Cardiovascular regular rate and rhythm with normal S1, S2. Psychiatric this patient is able to make decisions and demonstrates good insight into disease process. Alert and Oriented x 3. pleasant and cooperative. [name], [name] ([id number]) General Notes: Patient's wound bed shows signs of good granulation at this time although there was some [facility] noted still I feel like he's making good progress. No fevers, chills, nausea, or vomiting noted at this time. Integumentary (Hair, Skin) Wound #1 status  is Open. Original cause of wound was Gradually Appeared. The wound is located on the Left,Lateral Lower Leg. The wound measures 2.5cm length x 2.5cm width x 0.2cm depth; 4.909cm^2 area and 0.982cm^3 volume. There is Fat Layer (Subcutaneous Tissue) Exposed exposed. There is no tunneling or undermining noted. There is a medium amount of serous drainage noted. The wound margin is distinct with the outline attached to the wound base. There is no granulation within the wound bed. There is a large (67-100%) amount of necrotic tissue within the wound bed including Adherent Slough. The periwound skin appearance exhibited: Induration, Scarring, Hemosiderin Staining, Rubor. The periwound skin appearance did not exhibit: Callus, Crepitus, Excoriation, Rash, Dry/Scaly, Maceration, Atrophie Blanche, Cyanosis, Ecchymosis, Mottled, Pallor, Erythema. Periwound temperature was noted as No Abnormality. The periwound has tenderness on palpation. 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 Anesthetic (add to Medication List): Wound #1 Left,Lateral Lower Leg: Topical Lidocaine 4% cream applied to wound bed prior to debridement (In Clinic Only). Primary Wound Dressing: Wound #1 Left,Lateral Lower Leg: Other: - Mupirocin mix half and half with clobetasol Secondary Dressing:  Wound #1 Left,Lateral Lower Leg: Boardered Foam Dressing Dressing Change Frequency: Wound #1 Left,Lateral Lower Leg: Change dressing every day. Follow-up Appointments: Wound #1 Left,Lateral Lower Leg: Return Appointment in 2 weeks. Edema Control: Wound #1 Left,Lateral Lower Leg: Patient to wear own compression stockings Elevate legs to the level of the heart and pump ankles as often as  possible Medications-please add to medication list.: [name], [name] ([id number]) Wound #1 Left,Lateral Lower Leg: P.O. Antibiotics - Prescribed by Dermatology for Staph Infection My suggestion at this time is gonna be that we go ahead and initiate the above wound care measures for the next week patients in agreement with plan. This is a continuation of what we been doing I will see him back in two weeks to see were things stand. If anything changes worsens will let me know. Please see above for specific wound care orders. We will see patient for re-evaluation in 1 week(s) here in the clinic. If anything worsens or changes patient will contact our office for additional recommendations. Electronic Signature(s) Signed: 09/07/2018 11:56:08 PM By: Worthy Keeler PA-C Entered By: Worthy Keeler on 09/07/2018 23:46:15 [name], [name] ([id number]) -------------------------------------------------------------------------------- ROS/PFSH Details Patient Name: [name] Date of Service: 09/07/2018 3:45 PM Medical Record Number: [id number] Patient Account Number: [id number] Date of Birth/Sex: Jun 22, 1980 (37 y.o. M) Treating RN: Harold Barban Primary Care Provider: [name] Other Clinician: Referring Provider: [name] Treating Provider/Extender: Melburn Hake, Alvan Culpepper Weeks in Treatment: 14 Information Obtained From Patient Constitutional Symptoms (General Health) Complaints and Symptoms: Negative for: Fatigue; Fever; Chills; Marked Weight Change Respiratory Complaints and Symptoms: Negative for: Chronic or frequent coughs; Shortness of Breath Medical History: Positive for: Sleep Apnea - cpap Negative for: Aspiration; Asthma; Chronic Obstructive Pulmonary Disease (COPD); Pneumothorax Cardiovascular Complaints and Symptoms: Negative for: Chest pain; LE edema Medical History: Positive for: Hypertension Negative for: Angina; Arrhythmia; Congestive Heart Failure; Coronary Artery  Disease; Deep Vein Thrombosis; Hypotension; Myocardial Infarction; Peripheral Arterial Disease; Peripheral Venous Disease; Phlebitis; Vasculitis Psychiatric Complaints and Symptoms: Negative for: Anxiety; Claustrophobia Medical History: Negative for: Anorexia/bulimia; Confinement Anxiety Eyes Medical History: Negative for: Cataracts; Glaucoma; Optic Neuritis Ear/Nose/Mouth/Throat Medical History: Negative for: Chronic sinus problems/congestion; Middle ear problems Hematologic/Lymphatic Medical History: Negative for: Anemia; Hemophilia; Human Immunodeficiency Virus; Lymphedema; Sickle Cell Disease Pidgeon, Hjalmar ([id number]) Gastrointestinal Medical History: Positive for: Colitis Endocrine Medical History: Negative for: Type I Diabetes; Type II Diabetes Genitourinary Medical History: Negative for: End Stage Renal Disease Immunological Medical History: Negative for: Lupus Erythematosus; Raynaudos; Scleroderma Integumentary (Skin) Medical History: Negative for: History of Burn; History of pressure wounds Musculoskeletal Medical History: Negative for: Gout; Rheumatoid Arthritis; Osteoarthritis; Osteomyelitis Neurologic Medical History: Negative for: Dementia; Neuropathy; Quadriplegia; Paraplegia; Seizure Disorder Oncologic Medical History: Negative for: Received Chemotherapy; Received Radiation Immunizations Pneumococcal Vaccine: Received Pneumococcal Vaccination: No Implantable Devices No devices added Family and Social History Cancer: No; Diabetes: Yes - Mother,Maternal Grandparents; Heart Disease: Yes - Mother,Maternal Grandparents; Hereditary Spherocytosis: Yes - Siblings; Hypertension: Yes - Maternal Grandparents; Kidney Disease: No; Lung Disease: No; Seizures: No; Stroke: Yes - Siblings; Thyroid Problems: No; Tuberculosis: No; Current some day smoker; Marital Status - Married; Alcohol Use: Rarely; Drug Use: No History; Caffeine Use: Moderate; Financial Concerns: No;  Food, Clothing or Shelter Needs: No; Support System Lacking: No; Transportation Concerns: No Physician Affirmation I have reviewed and agree with the above information. Electronic Signature(s) Signed: 09/07/2018 11:56:08 PM By: Irean Hong [place], [place] ([id number]) Signed: 01/15/2019 1:03:35 PM By: Harold Barban Entered By: Worthy Keeler on 09/07/2018 23:45:47  [name], [name] ([id number]) -------------------------------------------------------------------------------- SuperBill Details Patient Name: WEBBER, MICHIELS Date of Service: 09/07/2018 Medical Record Number: [id number] Patient Account Number: [id number] Date of Birth/Sex: [birth year] (38 y.o. M) Treating RN: Harold Barban Primary Care Provider: [name] Other Clinician: Referring Provider: [name] Treating Provider/Extender: Melburn Hake, Maedell Hedger Weeks in Treatment: 38 Diagnosis Coding ICD-10 Codes Code Description [phone] Non-pressure chronic ulcer of left calf with fat layer exposed L88 Pyoderma gangrenosum I87.2 Venous insufficiency (chronic) (peripheral) Facility Procedures CPT4 Code: 62703500 Description: 93818 - WOUND CARE VISIT-LEV 3 EST PT Modifier: Quantity: 1 Physician Procedures CPT4 Code: [id number] Description: 96789 - WC PHYS LEVEL 4 - EST PT ICD-10 Diagnosis Description L97.222 Non-pressure chronic ulcer of left calf with fat layer expo L88 Pyoderma gangrenosum I87.2 Venous insufficiency (chronic) (peripheral) Modifier: sed Quantity: 1 Electronic Signature(s) Signed: 09/07/2018 11:56:08 PM By: Worthy Keeler PA-C Entered By: Worthy Keeler on 09/07/2018 23:46:31

## 2019-01-15 NOTE — Progress Notes (Signed)
[name], [name] ([id number]) Visit Report for 09/07/2018 Arrival Information Details Patient Name: [name], [name] Date of Service: 09/07/2018 3:45 PM Medical Record Number: [id number] Patient Account Number: [id number] Date of Birth/Sex: Oct 10, 1980 (38 y.o. M) Treating RN: [name] Primary Care Dalis Beers: [name] Other Clinician: Referring Tereza Gilham: [name] Treating Sameria Morss/Extender: Melburn Hake, [name]: 71 Visit Information History Since Last Visit Added or deleted any medications: No Patient Arrived: Ambulatory Any new allergies or adverse reactions: No Arrival Time: 16:07 Had a fall or experienced change in No Accompanied By: self activities of daily living that may affect Transfer Assistance: None risk of falls: Patient Identification Verified: Yes Signs or symptoms of abuse/neglect since last visito No Secondary Verification Process Completed: Yes Hospitalized since last visit: No Patient Requires Transmission-Based No Implantable device outside of the clinic excluding No Precautions: cellular tissue based products placed in the center Patient Has Alerts: No since last visit: Has Dressing in Place as Prescribed: Yes Pain Present Now: No Electronic Signature(s) Signed: 09/08/2018 3:23:54 PM By: [name], [name], RN, [name], [name], [name] Entered By: [name], [name], RN, [name], [name] on 09/07/2018 16:07:52 [name] ([id number]) -------------------------------------------------------------------------------- Clinic Level of Care Assessment Details Patient Name: [name] Date of Service: 09/07/2018 3:45 PM Medical Record Number: [id number] Patient Account Number: [id number] Date of Birth/Sex: [birth year] (37 y.o. M) Treating RN: Harold Barban Primary Care Orvin Netter: [name] Other Clinician: Referring Gayle Collard: [name] Treating Gerlean Cid/Extender: Melburn Hake, [name]: 11 Clinic Level of Care Assessment Items TOOL 4 Quantity  Score []  - Use when only an EandM is performed on FOLLOW-UP visit 0 ASSESSMENTS - Nursing Assessment / Reassessment X - Reassessment of Co-morbidities (includes updates in patient status) 1 10 X- 1 5 Reassessment of Adherence to Treatment Plan ASSESSMENTS - Wound and Skin Assessment / Reassessment X - Simple Wound Assessment / Reassessment - one wound 1 5 []  - 0 Complex Wound Assessment / Reassessment - multiple wounds []  - 0 Dermatologic / Skin Assessment (not related to wound area) ASSESSMENTS - Focused Assessment []  - Circumferential Edema Measurements - multi extremities 0 []  - 0 Nutritional Assessment / Counseling / Intervention []  - 0 Lower Extremity Assessment (monofilament, tuning fork, pulses) []  - 0 Peripheral Arterial Disease Assessment (using hand held doppler) ASSESSMENTS - Ostomy and/or Continence Assessment and Care []  - Incontinence Assessment and Management 0 []  - 0 Ostomy Care Assessment and Management (repouching, etc.) PROCESS - Coordination of Care X - Simple Patient / Family Education for ongoing care 1 15 []  - 0 Complex (extensive) Patient / Family Education for ongoing care []  - 0 Staff obtains Programmer, systems, Records, Test Results / Process Orders []  - 0 Staff telephones HHA, Nursing Homes / Clarify orders / etc []  - 0 Routine Transfer to another Facility (non-emergent condition) []  - 0 Routine Hospital Admission (non-emergent condition) []  - 0 New Admissions / Biomedical engineer / Ordering NPWT, Apligraf, etc. []  - 0 Emergency Hospital Admission (emergent condition) X- 1 10 Simple Discharge Coordination [place], [name] ([id number]) []  - 0 Complex (extensive) Discharge Coordination PROCESS - Special Needs []  - Pediatric / Minor Patient Management 0 []  - 0 Isolation Patient Management []  - 0 Hearing / Language / Visual special needs []  - 0 Assessment of Community assistance (transportation, D/C planning, etc.) []  - 0 Additional assistance /  Altered mentation []  - 0 Support Surface(s) Assessment (bed, cushion, seat, etc.) INTERVENTIONS - Wound Cleansing / Measurement X - Simple Wound Cleansing - one wound 1 5 []  -  0 Complex Wound Cleansing - multiple wounds X- 1 5 Wound Imaging (photographs - any number of wounds) []  - 0 Wound Tracing (instead of photographs) X- 1 5 Simple Wound Measurement - one wound []  - 0 Complex Wound Measurement - multiple wounds INTERVENTIONS - Wound Dressings []  - Small Wound Dressing one or multiple wounds 0 X- 1 15 Medium Wound Dressing one or multiple wounds []  - 0 Large Wound Dressing one or multiple wounds X- 1 5 Application of Medications - topical []  - 0 Application of Medications - injection INTERVENTIONS - Miscellaneous []  - External ear exam 0 []  - 0 Specimen Collection (cultures, biopsies, blood, body fluids, etc.) []  - 0 Specimen(s) / Culture(s) sent or taken to Lab for analysis []  - 0 Patient Transfer (multiple staff / Civil Service fast streamer / Similar devices) []  - 0 Simple Staple / Suture removal (25 or less) []  - 0 Complex Staple / Suture removal (26 or more) []  - 0 Hypo / Hyperglycemic Management (close monitor of Blood Glucose) []  - 0 Ankle / Brachial Index (ABI) - do not check if billed separately X- 1 5 Vital Signs [name], [name] ([id number]) Has the patient been seen at the hospital within the last three years: Yes Total Score: 85 Level Of Care: New/Established - Level 3 Electronic Signature(s) Signed: 01/15/2019 1:03:35 PM By: Harold Barban Entered By: Harold Barban on 09/07/2018 16:49:04 [name] ([id number]) -------------------------------------------------------------------------------- Encounter Discharge Information Details Patient Name: [name] Date of Service: 09/07/2018 3:45 PM Medical Record Number: [id number] Patient Account Number: [id number] Date of Birth/Sex: 1980/07/27 (37 y.o. M) Treating RN: Harold Barban Primary Care Eli Adami: [name] Other Clinician: Referring Selden Noteboom: [name] Treating Jacquan Savas/Extender: Melburn Hake, [name]: 33 Encounter Discharge Information Items Discharge Condition: Stable Ambulatory Status: Ambulatory Discharge Destination: Home Transportation: Private Auto Accompanied By: self Schedule Follow-up Appointment: Yes Clinical Summary of Care: Electronic Signature(s) Signed: 01/15/2019 1:03:35 PM By: Harold Barban Entered By: Harold Barban on 09/07/2018 16:56:07 [name] ([id number]) -------------------------------------------------------------------------------- Lower Extremity Assessment Details Patient Name: [name] Date of Service: 09/07/2018 3:45 PM Medical Record Number: [id number] Patient Account Number: [id number] Date of Birth/Sex: 1980-07-27 (37 y.o. M) Treating RN: [name] Primary Care Altariq Goodall: [name] Other Clinician: Referring Dymon Summerhill: [name] Treating Merlyn Conley/Extender: Melburn Hake, [name]: 36 Edema Assessment Assessed: [Left: No] [Right: No] Edema: [Left: Ye] [Right: s] Calf Left: Right: Point of Measurement: 33 cm From Medial Instep 51 cm cm Ankle Left: Right: Point of Measurement: 11 cm From Medial Instep 30.5 cm cm Vascular Assessment Pulses: Dorsalis Pedis Palpable: [Left:Yes] Electronic Signature(s) Signed: 09/08/2018 3:23:54 PM By: [name], [name], RN, [name], [name], [name] Entered By: [name], [name], RN, [name], [name] on 09/07/2018 [place], [name] ([id number]) -------------------------------------------------------------------------------- Multi Wound Chart Details Patient Name: [name] Date of Service: 09/07/2018 3:45 PM Medical Record Number: [id number] Patient Account Number: [id number] Date of Birth/Sex: 17-Mar-1981 (37 y.o. M) Treating RN: Harold Barban Primary Care Porschia Willbanks: [name] Other Clinician: Referring Syan Cullimore: [name] Treating Karisha Marlin/Extender: Melburn Hake, [name]: 4 Vital Signs Height(in): 71 Pulse(bpm): 95 Weight(lbs): 338 Blood Pressure(mmHg): 136/92 Body Mass Index(BMI): 47 Temperature(F): 98.5 Respiratory Rate 16 (breaths/min): Photos: [N/A:N/A] Wound Location: Left Lower Leg - Lateral N/A N/A Wounding Event: Gradually Appeared N/A N/A Primary Etiology: Pyoderma N/A N/A Comorbid History: Sleep Apnea, Hypertension, N/A N/A Colitis Date Acquired: 11/18/2015 N/A N/A Weeks of Treatment: 91 N/A N/A Wound Status: Open N/A N/A Measurements L x W x D 2.5x2.5x0.2 N/A N/A (  cm) Area (cm) : 4.909 N/A N/A Volume (cm) : 0.982 N/A N/A % Reduction in Area: 0.00% N/A N/A % Reduction in Volume: 75.00% N/A N/A Classification: Full Thickness With Exposed N/A N/A Support Structures Exudate Amount: Medium N/A N/A Exudate Type: Serous N/A N/A Exudate Color: amber N/A N/A Wound Margin: Distinct, outline attached N/A N/A Granulation Amount: None Present (0%) N/A N/A Necrotic Amount: Large (67-100%) N/A N/A Exposed Structures: Fat Layer (Subcutaneous N/A N/A Tissue) Exposed: Yes Fascia: No Tendon: No Muscle: No Joint: No Bone: No [name], [name] ([id number]) Epithelialization: Small (1-33%) N/A N/A Treatment Notes Electronic Signature(s) Signed: 01/15/2019 1:03:35 PM By: Harold Barban Entered By: Harold Barban on 09/07/2018 16:47:29 [name] ([id number]) -------------------------------------------------------------------------------- Multi-Disciplinary Care Plan Details Patient Name: [name] Date of Service: 09/07/2018 3:45 PM Medical Record Number: [id number] Patient Account Number: [id number] Date of Birth/Sex: [birth year] (37 y.o. M) Treating RN: Harold Barban Primary Care Davy Faught: [name] Other Clinician: Referring Tahlor Berenguer: [name] Treating Angelyn Osterberg/Extender: Melburn Hake, [name]: 15 Active Inactive Orientation to the Wound Care Program Nursing  Diagnoses: Knowledge deficit related to the wound healing center program Goals: Patient/caregiver will verbalize understanding of the [facility] Program Date Initiated: 12/11/2016 Target Resolution Date: 03/13/2017 Goal Status: Active Interventions: Provide education on orientation to the wound center Notes: Venous Leg Ulcer Nursing Diagnoses: Knowledge deficit related to disease process and management Potential for venous Insuffiency (use before diagnosis confirmed) Goals: Non-invasive venous studies are completed as ordered Date Initiated: 12/11/2016 Target Resolution Date: 03/13/2017 Goal Status: Active Patient will maintain optimal edema control Date Initiated: 12/11/2016 Target Resolution Date: 03/13/2017 Goal Status: Active Patient/caregiver will verbalize understanding of disease process and disease management Date Initiated: 12/11/2016 Target Resolution Date: 03/13/2017 Goal Status: Active Verify adequate tissue perfusion prior to therapeutic compression application Date Initiated: 12/11/2016 Target Resolution Date: 03/13/2017 Goal Status: Active Interventions: Assess peripheral edema status every visit. Compression as ordered Provide education on venous insufficiency Treatment Activities: [name], [name] ([id number]) Therapeutic compression applied : 12/11/2016 Notes: Wound/Skin Impairment Nursing Diagnoses: Impaired tissue integrity Knowledge deficit related to ulceration/compromised skin integrity Goals: Patient/caregiver will verbalize understanding of skin care regimen Date Initiated: 12/11/2016 Target Resolution Date: 03/13/2017 Goal Status: Active Ulcer/skin breakdown will have a volume reduction of 30% by week 4 Date Initiated: 12/11/2016 Target Resolution Date: 03/13/2017 Goal Status: Active Ulcer/skin breakdown will have a volume reduction of 50% by week 8 Date Initiated: 12/11/2016 Target Resolution Date: 03/13/2017 Goal Status:  Active Ulcer/skin breakdown will have a volume reduction of 80% by week 12 Date Initiated: 12/11/2016 Target Resolution Date: 03/13/2017 Goal Status: Active Ulcer/skin breakdown will heal within 14 weeks Date Initiated: 12/11/2016 Target Resolution Date: 03/13/2017 Goal Status: Active Interventions: Assess patient/caregiver ability to obtain necessary supplies Assess patient/caregiver ability to perform ulcer/skin care regimen upon admission and as needed Assess ulceration(s) every visit Provide education on ulcer and skin care Treatment Activities: Skin care regimen initiated : 12/11/2016 Topical wound management initiated : 12/11/2016 Notes: Electronic Signature(s) Signed: 01/15/2019 1:03:35 PM By: Harold Barban Entered By: Harold Barban on 09/07/2018 16:47:21 [name], [name] ([id number]) -------------------------------------------------------------------------------- Pain Assessment Details Patient Name: [name] Date of Service: 09/07/2018 3:45 PM Medical Record Number: [id number] Patient Account Number: [id number] Date of Birth/Sex: 31-May-1980 (37 y.o. M) Treating RN: [name] Primary Care Blanton Kardell: [name] Other Clinician: Referring Omare Bilotta: [name] Treating Itzia Cunliffe/Extender: Melburn Hake, [name]: 57 Active Problems Location of Pain Severity and Description of Pain Patient Has Paino No Site Locations Pain Management and  Medication Current Pain Management: Notes Patient denies pain at this time. Electronic Signature(s) Signed: 09/08/2018 3:23:54 PM By: [name], [name], RN, [name], [name], [name] Entered By: [name], [name], RN, [name], [name] on 09/07/2018 16:08:17 [name] ([id number]) -------------------------------------------------------------------------------- Patient/Caregiver Education Details Patient Name: [name] Date of Service: 09/07/2018 3:45 PM Medical Record Number: [id number] Patient Account Number: [id number] Date of  Birth/Gender: [birth year] (37 y.o. M) Treating RN: Harold Barban Primary Care Physician: [name] Other Clinician: Referring Physician: [name] Treating Physician/Extender: Sharalyn Ink in Treatment: 21 Education Assessment Education Provided To: Patient Education Topics Provided Wound/Skin Impairment: Handouts: Caring for Your Ulcer Methods: Demonstration, Explain/Verbal Responses: State content correctly Electronic Signature(s) Signed: 01/15/2019 1:03:35 PM By: Harold Barban Entered By: Harold Barban on 09/07/2018 16:48:09 [name] ([id number]) -------------------------------------------------------------------------------- Wound Assessment Details Patient Name: [name] Date of Service: 09/07/2018 3:45 PM Medical Record Number: [id number] Patient Account Number: [id number] Date of Birth/Sex: 09-08-1980 (37 y.o. M) Treating RN: [name] Primary Care Carmell Elgin: [name] Other Clinician: Referring Millette Halberstam: [name] Treating Teshawn Moan/Extender: Melburn Hake, [name]: 57 Wound Status Wound Number: 1 Primary Etiology: Pyoderma Wound Location: Left Lower Leg - Lateral Wound Status: Open Wounding Event: Gradually Appeared Comorbid History: Sleep Apnea, Hypertension, Colitis Date Acquired: 11/18/2015 Weeks Of Treatment: 91 Clustered Wound: No Photos Wound Measurements Length: (cm) 2.5 Width: (cm) 2.5 Depth: (cm) 0.2 Area: (cm) 4.909 Volume: (cm) 0.982 % Reduction in Area: 0% % Reduction in Volume: 75% Epithelialization: Small (1-33%) Tunneling: No Undermining: No Wound Description Full Thickness With Exposed Support Foul Odor A Classification: Structures Slough/Fibr Wound Margin: Distinct, outline attached Exudate Medium Amount: Exudate Type: Serous Exudate Color: amber fter Cleansing: No ino Yes Wound Bed Granulation Amount: None Present (0%) Exposed Structure Necrotic Amount: Large  (67-100%) Fascia Exposed: No Necrotic Quality: Adherent Slough Fat Layer (Subcutaneous Tissue) Exposed: Yes Tendon Exposed: No Muscle Exposed: No Joint Exposed: No Bone Exposed: No Periwound Skin Texture Basaldua, Jakobi ([id number]) Texture Color No Abnormalities Noted: No No Abnormalities Noted: No Callus: No Atrophie Blanche: No Crepitus: No Cyanosis: No Excoriation: No Ecchymosis: No Induration: Yes Erythema: No Rash: No Hemosiderin Staining: Yes Scarring: Yes Mottled: No Pallor: No Moisture Rubor: Yes No Abnormalities Noted: No Dry / Scaly: No Temperature / Pain Maceration: No Temperature: No Abnormality Tenderness on Palpation: Yes Electronic Signature(s) Signed: 09/08/2018 3:23:54 PM By: [name], [name], RN, [name], [name], [name] Entered By: [name], [name], RN, [name], [name] on 09/07/2018 16:13:16 [name] ([id number]) -------------------------------------------------------------------------------- Vitals Details Patient Name: [name] Date of Service: 09/07/2018 3:45 PM Medical Record Number: [id number] Patient Account Number: [id number] Date of Birth/Sex: Aug 16, 1980 (37 y.o. M) Treating RN: [name] Primary Care Malon Branton: [name] Other Clinician: Referring Kaylon Hitz: [name] Treating Ahsley Attwood/Extender: Melburn Hake, [name]: 72 Vital Signs Time Taken: 16:08 Temperature (F): 98.5 Height (in): 71 Pulse (bpm): 95 Weight (lbs): 338 Respiratory Rate (breaths/min): 16 Body Mass Index (BMI): 47.1 Blood Pressure (mmHg): 136/92 Reference Range: 80 - 120 mg / dl Electronic Signature(s) Signed: 09/08/2018 3:23:54 PM By: [name], [name], RN, [name], [name], [name] Entered By: [name], [name], RN, [name], [name] on 09/07/2018 [phone]

## 2019-01-18 ENCOUNTER — Other Ambulatory Visit: Payer: Self-pay

## 2019-01-18 ENCOUNTER — Telehealth: Payer: Self-pay

## 2019-01-18 ENCOUNTER — Encounter: Payer: 59 | Admitting: Physician Assistant

## 2019-01-18 DIAGNOSIS — L97222 Non-pressure chronic ulcer of left calf with fat layer exposed: Secondary | ICD-10-CM | POA: Diagnosis not present

## 2019-01-18 NOTE — Progress Notes (Addendum)
KORDELL, JAFRI ([id number]) Visit Report for 01/18/2019 Chief Complaint Document Details Patient Name: [name], [name] Date of Service: 01/18/2019 8:15 AM Medical Record Number: [id number] Patient Account Number: [id number] Date of Birth/Sex: 01/03/1981 (38 y.o. 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 M) Treating RN: Harold Barban Primary Care Provider: [name] Other Clinician: Referring Provider: [name] Treating Provider/Extender: Melburn Hake, Fredrik Mogel Weeks in Treatment: 110 History of Present Illness HPI Description: 12/04/16; 38 year old man who comes into the clinic today for review of a wound on the posterior left calf. He tells me that is been there for about a year. He is not a diabetic he does smoke half a pack per day. He was seen in the ER on 11/20/16 felt to have cellulitis around the wound and was given clindamycin. An x-ray did not show osteomyelitis. The patient initially tells me that he has a milk allergy that sets off a pruritic itching rash on his lower legs which she scratches incessantly and he thinks that's what may have set up the wound. He has been using various topical antibiotics and ointments without any effect. He works in a [employer] and is on his feet all day. He does not have a  prior history of wounds however he does have the rash on both lower legs the right arm and the ventral aspect of his left arm. These are excoriations and clearly have had scratching however there are of macular looking areas on both legs including a substantial larger area on the right leg. This does not have an underlying open area. There is no blistering. The patient tells me that 2 years ago in [place] in response to the rash on his legs he saw a dermatologist who told him he had a condition which may be pyoderma gangrenosum although I may be putting words into his mouth. He seemed to recognize this. 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 He is not in any treatment for this. He's had no recent travel 12/11/16; the patient arrives today with his wound and roughly the same condition we've been using silver alginate this is a deep punched out wound with some surrounding erythema but no tenderness. Biopsy I did did not show confirmed pyoderma gangrenosum suggested nonspecific inflammation and vasculitis but does not provide an actual description of what was seen by the pathologist. I'm really not able to understand this We have also received information from the patient's dermatologist in [place] notes from April 2016. This was a doctor Agarwal- antal. 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 He was prescribed topical steroid high potency under occlusion which helped but at this point the patient did not have a deep punched out wound. 12/18/16; the patient's wound is larger in terms of surface area however this surface looks better and there is less depth. The surrounding erythema also is better. The patient states that the wrap we put on came off 2 days ago when he has been using  his compression stockings. He we are in the process of getting a dermatology consult. 12/26/16 on evaluation today patient's left lower extremity wound shows evidence  of infection with surrounding erythema noted. He has been tolerating the dressing changes but states that he has noted more discomfort. There is a larger area of erythema surrounding the wound. No fevers, chills, nausea, or vomiting noted at this time. With that being said the wound still does have slough covering the surface. He is not allergic to any medication that he is aware of at this point. In regard to his right lower extremity he had several regions that are erythematous and pruritic he wonders if there's anything we can do to help that. 01/02/17 I reviewed patient's wound culture which was obtained his visit last week. He was placed on doxycycline at that point. Unfortunately that does not appear to be an antibiotic that would likely help with the situation however the pseudomonas noted on culture is sensitive to Cipro. Also unfortunately patient's wound seems to have a large compared to last week's evaluation. Not severely so but there are definitely increased measurements in general. He is continuing to have discomfort as well he writes this to be a seven out of 10. In fact he would prefer me not to perform any debridement today due to the fact that he is having discomfort and considering he has an active infection on the little reluctant to do so anyway. No fevers, chills, nausea, or vomiting noted at this time. 01/08/17; patient seems dermatology on September 5. I suspect dermatology will want the slides from the biopsy I did sent to their pathologist. I'm not sure if there is a way we can expedite that. In any case the culture I did before I left on vacation 3 weeks ago showed Pseudomonas he was given 10 days of Cipro and per her description of her intake nurses is actually somewhat better this week although the wound is quite a bit bigger than I remember the last time I saw this. He still has 3 more days of Cipro BRAEDEN, KENNAN ([id number]) 01/21/17; dermatology appointment tomorrow. He  has completed the ciprofloxacin for Pseudomonas. Surface of the wound looks better however he is had some deterioration in the lesions on his right leg. Meantime the left lateral leg wound we will continue with sample 01/29/17; patient had his dermatology appointment but I can't yet see that note. He is completed his antibiotics. The wound is more superficial but considerably larger in circumferential area than when he came in. This is in his left lateral calf. He also has swollen erythematous areas with superficial wounds on the right leg and small papular areas on both arms. There apparently areas in her his upper thighs and buttocks I did not look at those. Dermatology biopsied the right leg. Hopefully will have their input next week. 02/05/17; patient went back to see his dermatologist who told him that he had a "scratching problem" as well as staph. He is now on a 30 day course of doxycycline and I believe she gave him triamcinolone cream to the right leg areas to help with the itching [not exactly sure but probably triamcinolone]. She apparently looked at the left lateral leg wound although this was not rebiopsied and I think felt to be ultimately part of the same pathogenesis. He is using sample border foam and changing nevus himself. He now has a new open area on the right posterior leg which was his biopsy site I don't  have any of the dermatology notes 02/12/17; we put the patient in compression last week with SANTYL to the wound on the left leg and the biopsy. Edema is much better and the depth of the wound is now at level of skin. Area is still the same oBiopsy site on the right lateral leg we've also been using santyl with a border foam dressing and he is changing this himself. 02/19/17; Using silver alginate started last week to both the substantial left leg wound and the biopsy site on the right wound. He is tolerating compression well. Has a an appointment with his primary M.D. tomorrow  wondering about diuretics although I'm wondering if the edema problem is actually lymphedema 02/26/17; the patient has been to see his primary doctor Dr. Jerrel Ivory at [facility] our primary care. She started him on Lasix 20 mg and this seems to have helped with the edema. However we are not making substantial change with the left lateral calf wound and inflammation. The biopsy site on the right leg also looks stable but not really all that different. 03/12/17; the patient has been to see vein and vascular Dr. Lucky Cowboy. He has had venous reflux studies I have not reviewed these. I did get a call from his dermatology office. 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 They therefore recommended clobetasol and tetracycline i.e. 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 03/26/17; apparently the patient just had reflux studies not an appointment with Dr. dew. She arrives in clinic today having applied clobetasol for 2-3 weeks. He notes over the last 2-3 days excessive drainage having to change the dressing 3-4 times a day and also expanding erythema. 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 Lillia Carmel at [facility] dermatology locally who kindly saw him at my request. 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 My concern was the degree of erythema medially extending from his left leg wound which was either inflammation from  pyoderma or cellulitis. I put him on Augmentin however culture of the wound showed Pseudomonas which is quinolone sensitive. I really don't believe he has cellulitis however in view of everything I will continue and give him a course of Cipro. He is also on doxycycline as an immune modulator for the pyoderma. In addition to his original wound on the left lateral leg with surrounding erythema he has a wound on the right posterior calf which was an original biopsy site done by dermatology. 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 Saw Dr. Lillia Carmel yesterday. He has been using topical antibiotics to both wound areas his original wound on the left and the biopsies/pathergy area on the right. There is definitely some improvement in the inflammation around the wound on the right although the patient states he has increasing sensitivity of the wounds. He is on prednisone 60 and doxycycline 1 as prescribed by Dr. Lillia Carmel. He is covering the topical  antibiotic with gauze and putting this in his own compression stocks and changing this daily. He states that Dr. Lottie Rater did a culture of the left leg wound yesterday 05/07/17; pyoderma gangrenosum. The patient saw Dr. Lillia Carmel yesterday and has a follow-up with her in one month. He is still using topical antibiotics to both wounds although he can't recall exactly what type. He is still on prednisone 60 mg. Dr. Lillia Carmel stated that the doxycycline could stop if we were in agreement. 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 The right medial leg was induced by biopsy/pathergy. The area on the right is essentially healed. 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 The right medial leg has closed and  remains closed. He is still on prednisone 60. oHe tells me he missed his last dermatology appointment with Dr. Lillia Carmel but will make another appointment. He reports that her blood sugar at a recent screen in [place] was high 200's. He was 180 today. He is more cushingoid blood pressure is Fontenette, Rand ([id number]) up a bit. I think he is going to require still much longer prednisone perhaps another 3 months before attempting to taper. In the meantime his wound is a lot better. Smaller. He is cleaning this off daily and applying topical antibiotics. When he was last in the clinic I thought about changing to [facility] and actually put in a couple of calls to dermatology although probably not during their business hours. 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 He continues on oral prednisone. He has been using triple antibiotic ointment. There is surface debris and we will transition to [facility] and have him return in 2 weeks. He has lost 30 pounds since his last appointment with lifestyle modification. He may benefit from topical steroid cream for treatment this can be considered at a later date. 08/22/17 on evaluation today patient appears to actually be doing rather well in regard to his left lateral lower extremity ulcer. He has actually been managed by Dr. Dellia Nims most recently. Patient is currently on oral steroids at this time. This seems to have been of benefit for him. Nonetheless his last visit was actually with Leah on 08/06/17. Currently he is not utilizing any topical steroid creams although this could be of benefit as well. No fevers, chills, nausea, or vomiting noted at this time. 09/05/17 on evaluation today patient appears to be doing better in regard to his left lateral lower extremity ulcer. He has been tolerating the dressing changes without complication.  He is using Santyl with good effect. Overall I'm very pleased with how things are standing at this point. Patient likewise is happy that this is doing better. 09/19/17 on evaluation today patient actually appears to be doing rather well in regard to his left lateral lower extremity ulcer. 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 He's not having any significant pain. 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 He has been tolerating the Santyl without complication and in general I feel like he's making good  progress. 10/17/17 on evaluation today patient appears to be doing very well in regard to his left lateral lower surety ulcer. He has been tolerating the dressing changes without complication. There does not appear to be any evidence of infection he's alternating the Santyl and the triple antibiotic ointment every other day this seems to be doing well for him. 11/03/17 on evaluation today patient appears to be doing very well in regard to his left lateral lower extremity ulcer. He is been tolerating the dressing changes without complication which is good news. Fortunately there does not appear to be any evidence of infection which is also great news. Overall is doing excellent they are starting to taper down on the prednisone is down to 40 mg at this point it also started topical clobetasol for him. 11/17/17 on evaluation today patient appears to be doing well in regard to his left lateral lower surety ulcer. He's been tolerating the dressing changes without complication. He does note that he is having no pain, no excessive drainage or discharge, and overall he feels like things are going about how he would expect and hope they would. Overall he seems to have no evidence of infection at this time in my opinion which is  good news. 12/04/17-He is seen in follow-up evaluation for right lateral lower extremity ulcer. He has been applying topical steroid cream. Today's measurement show slight increase in size. Over the next 2 weeks we will transition to every other day Santyl and steroid cream. He has been encouraged to monitor for changes and notify clinic with any concerns 12/15/17 on evaluation today patient's left lateral motion the ulcer and fortunately is doing worse again at this point. This just since last week to this week has close to doubled in size according to the patient. I did not seeing last week's I do not have a visual to compare this to in our system was also down so we do not have all the charts and at this point. Nonetheless it does have me somewhat concerned in regard to the fact that again he was worried enough about it he has contact the dermatology that placed them back on the full strength, 50 mg a day of the prednisone that he was taken previous. He continues to alternate using clobetasol along with Santyl at this point. He is obviously somewhat frustrated. 12/22/17 on evaluation today patient appears to be doing a little worse compared to last evaluation. Unfortunately the wound is a little deeper and slightly larger than the last week's evaluation. With that being said he has made some progress in regard to the irritation surrounding at this time unfortunately despite that progress that's been made he still has a significant issue going on here. 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 For that reason I discussed with him today the possibility of perform a wound culture to ensure there's nothing overtly infected. 01/06/18 on evaluation today patient's wound is larger and deeper than previously evaluated. With that being said it did appear that  his wound was infected after my last evaluation with him. Subsequently I did end up prescribing a prescription for Bactrim DS which she has been taking and having no complication with. Fortunately there does not appear to be any evidence of Suddreth, Talmadge ([id number]) infection at this point in time as far as anything spreading, no want to  touch, and overall I feel like things are showing signs of improvement. 01/13/18 on evaluation today patient appears to be even a little larger and deeper than last time. There still muscle exposed in the base of the wound. Nonetheless he does appear to be less erythematous I do believe inflammation is calming down also believe the infection looks like it's probably resolved at this time based on what I'm seeing. No fevers, chills, nausea, or vomiting noted at this time. 01/30/18 on evaluation today patient actually appears to visually look better for the most part. Unfortunately those visually this looks better he does seem to potentially have what may be an abscess in the muscle that has been noted in the central portion of the wound. This is the first time that I have noted what appears to be fluctuance in the central portion of the muscle. With that being said I'm somewhat more concerned about the fact that this might indicate an abscess formation at this location. I do believe that an ultrasound would be appropriate. This is likely something we need to try to do as soon as possible. He has been switch to mupirocin ointment and he is no longer using the steroid ointment as prescribed by dermatology he sees them again next week he's been decreased from 60 to 40 mg of prednisone. 03/09/18 on evaluation today patient actually appears to be doing a little better compared to last time I saw him. There's not as much erythema surrounding the wound itself. He I did review his most recent infectious disease note which was dated 02/24/18. He saw Dr. Michel Bickers in  [place]. 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 Patient is now off of antibiotics and they are continually observing this. There seems to be no change in the past two weeks in my pinion based on what the patient says and what I see today compared to what Dr. Megan Salon likely saw two weeks ago. No fevers, chills, nausea, or vomiting noted at this time. 03/23/18 on evaluation today patient's wound actually appears to be showing signs of improvement which is good news. He is currently still on the Dapsone. He is also working on tapering the prednisone to get off of this and Dr. Lottie Rater is working with him in this regard. Nonetheless overall I feel like the wound is doing well it does appear based on the infectious disease note that I reviewed from Dr. 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 I did also review the results of his ultrasound of left lower extremity which revealed there was a dentist tissue in the base of the wound without an abscess noted. 04/06/18 on evaluation today the patient's left lateral lower extremity ulcer actually appears to be doing fairly well which is excellent news. There does not appear to be any evidence of infection at this time which is also great news. Overall he still does have a significantly large ulceration although little by little he seems to be making progress. He is down to 10 mg a day of the prednisone. 04/20/18 on evaluation today patient actually appears to be doing excellent at this time in regard to his left lower extremity ulcer. He's making signs of good progress unfortunately this is taking much longer than we would really like  to see but nonetheless he is making progress. Fortunately there does not appear to be any evidence of infection at this  time. No fevers, chills, nausea, or vomiting noted at this time. The patient has not been using the Santyl due to the cost he hadn't got in this field yet. He's mainly been using the antibiotic ointment topically. Subsequently he also tells me that he really has not been scrubbing in the shower I think this would be helpful again as I told him it doesn't have to be anything too aggressive to even make it believe just enough to keep it free of some of the loose slough and biofilm on the wound surface. 05/11/18 on evaluation today patient's wound appears to be making slow but sure progress in regard to the left lateral lower extremity ulcer. He is been tolerating the dressing changes without complication. Fortunately there does not appear to be any evidence of infection at this time. He is still just using triple antibiotic ointment along with clobetasol occasionally over the area. He never got the Santyl and really does not seem to intend to in my pinion. 06/01/18 on evaluation today patient appears to be doing a little better in regard to his left lateral lower extremity ulcer. He states that overall he does not feel like he is doing as well with the Dapsone as he did with the prednisone. Nonetheless he sees his dermatologist later today and is gonna talk to them about the possibility of going back on the prednisone. Overall again I believe that the wound would be better if you would utilize Santyl but he really does not seem to be interested in going back to the [place] at this point. He has been using triple antibiotic ointment. 06/15/18 on evaluation today patient's wound actually appears to be doing about the same at this point. Fortunately there is no signs of infection at this time. He has made slight improvements although he continues to not really want to clean the wound bed at this point. He states that he just doesn't mess with it he doesn't want to cause any problems with everything else  he has going on. He has been on medication, antibiotics as prescribed by his dermatologist, for a staff infection of his lower extremities which is really drying out now and looking much better he tells me. Fortunately there is no sign of overall infection. HYKEEM, OJEDA ([id number]) 06/29/18 on evaluation today patient appears to be doing well in regard to his left lateral lower surety ulcer all things considering. Fortunately his staff infection seems to be greatly improved compared to previous. He has no signs of infection and this is drying up quite nicely. He is still the doxycycline for this is no longer on cental, Dapsone, or any of the other medications. His dermatologist has recommended possibility of an infusion but right now he does not want to proceed with that. 07/13/18 on evaluation today patient appears to be doing about the same in regard to his left lateral lower surety ulcer. Fortunately there's no signs of infection at this time which is great news. Unfortunately he still builds up a significant amount of Slough/biofilm of the surface of the wound he still is not really cleaning this as he should be appropriately. Again I'm able to easily with saline and gauze remove the majority of this on the surface which if you would do this at home would likely be a dramatic improvement for him as far as getting the area  to improve. Nonetheless overall I still feel like he is making progress is just very slow. I think Santyl will be of benefit for him as well. Still he has not gotten this as of this point. 07/27/18 on evaluation today patient actually appears to be doing little worse in regards of the erythema around the periwound region of the wound he also tells me that he's been having more drainage currently compared to what he was experiencing last time I saw him. He states not quite as bad as what he had because this was infected previously but nonetheless is still appears to be doing  poorly. Fortunately there is no evidence of systemic infection at this point. The patient tells me that he is not going to be able to afford the Santyl. He is still waiting to hear about the infusion therapy with his dermatologist. Apparently she wants an updated colonoscopy first. 08/10/18 on evaluation today patient appears to be doing better in regard to his left lateral lower extremity ulcer. Fortunately he is showing signs of improvement in this regard he's actually been approved for Remicade infusion's as well although this has not been scheduled as of yet. Fortunately there's no signs of active infection at this time in regard to the wound although he is having some issues with infection of the right lower extremity is been seen as dermatologist for this. Fortunately they are definitely still working with him trying to keep things under control. 09/07/18 on evaluation today patient is actually doing rather well in regard to his left lateral lower extremity ulcer. He notes these actually having some hair grow back on his extremity which is something he has not seen in years. He also tells me that the pain is really not giving them any trouble at this time which is also good news overall she is very pleased with the progress he's using a combination of the mupirocin along with the probate is all mixed. 09/21/18 on evaluation today patient actually appears to be doing fairly well all things considered in regard to his looks from the ulcer. He's been tolerating the dressing changes without complication. Fortunately there's no signs of active infection at this time which is good news he is still on all antibiotics or prevention of the staff infection. He has been on prednisone for time although he states it is gonna contact his dermatologist and see if she put them on a short course due to some irritation that he has going on currently. Fortunately there's no evidence of any overall worsening this is  going very slow I think cental would be something that would be helpful for him although he states that $50 for tube is quite expensive. He therefore is not willing to get that at this point. 10/06/18 on evaluation today patient actually appears to be doing decently well in regard to his left lateral leg ulcer. He's been tolerating the dressing changes without complication. Fortunately there's no signs of active infection at this time. Overall I'm actually rather pleased with the progress he's making although it's slow he doesn't show any signs of infection and he does seem to be making some improvement. I do believe that he may need a switch up and dressings to try to help this to heal more appropriately and quickly. 10/19/18 on evaluation today patient actually appears to be doing better in regard to his left lateral lower extremity ulcer. This is shown signs of having much less Slough buildup at this point due to  the fact he has been using the Santyl. Obviously this is very good news. The overall size of the wound is not dramatically smaller but again the appearance is. 11/02/18 on evaluation today patient actually appears to be doing quite well in regard to his lower Trinity ulcer. A lot of the skin around the ulcer is actually somewhat irritating at this point this seems to be more due to the dressing causing irritation from the adhesive that anything else. Fortunately there is no signs of active infection at this time. 11/24/18 on evaluation today patient appears to be doing a little worse in regard to his overall appearance of his lower extremity ulcer. There's more erythema and warmth around the wound unfortunately. He is currently on doxycycline which he has been on for some time. With that being said I'm not sure that seems to be helping with what appears to possibly be an acute cellulitis with regard to his left lower extremity ulcer. No fevers, chills, nausea, or vomiting noted at this  time. 12/08/18 on evaluation today patient's wounds actually appears to be doing significantly better compared to his last evaluation. He has been using Santyl along with alternating tripling about appointment as well as the steroid cream seems to be doing Pellum, Ritvik ([id number]) quite well and the wound is showing signs of improvement which is excellent news. Fortunately there's no evidence of infection and in fact his culture came back negative with only normal skin flora noted. 12/21/2018 upon evaluation today patient actually appears to be doing excellent with regard to his ulcer. This is actually the best that I have seen it since have been helping to take care of him. It is both smaller as well as less slough noted on the surface of the wound and seems to be showing signs of good improvement with new skin growing from the edges. He has been using just the triamcinolone he does wonder if he can get a refill of that ointment today. 01/04/2019 upon evaluation today patient actually appears to be doing well with regard to his left lateral lower extremity ulcer. With that being said it does not appear to be that he is doing quite as well as last time as far as progression is concerned. There does not appear to be any signs of infection or significant irritation which is good news. With that being said I do believe that he may benefit from switching to a collagen based dressing based on how clean The wound appears. 01/18/2019 on evaluation today patient actually appears to be doing well with regard to his wound on the left lower extremity. He is not made a lot of progress compared to where we were previous but nonetheless does seem to be doing okay at this time which is good news. There is no signs of active infection which is also good news. My only concern currently is I do wish we can get him into utilizing the collagen dressing his insurance would not pay for the supplies that we ordered although  it appears that he may be able to order this through his supply company that he typically utilizes. This is [employer]. Nonetheless he did try to order it during the office visit today and it appears this did go through. We will see if he can get that it is a different brand but nonetheless he has collagen and I do think will be beneficial. Electronic Signature(s) Signed: 01/18/2019 6:16:57 PM By: Worthy Keeler PA-C Entered By: Worthy Keeler  on 01/18/2019 18:16:57 MADISON, ALBEA ([id number]) -------------------------------------------------------------------------------- Physical Exam Details Patient Name: KAAN, TOSH Date of Service: 01/18/2019 8:15 AM Medical Record Number: [id number] Patient Account Number: [id number] Date of Birth/Sex: [birth year] (37 y.o. M) Treating RN: Harold Barban Primary Care Provider: [name] Other Clinician: Referring Provider: [name] Treating Provider/Extender: Melburn Hake, Kaidan Harpster Weeks in Treatment: 110 Constitutional Well-nourished and well-hydrated in no acute distress. Respiratory normal breathing without difficulty. clear to auscultation bilaterally. Cardiovascular regular rate and rhythm with normal S1, S2. Psychiatric this patient is able to make decisions and demonstrates good insight into disease process. Alert and Oriented x 3. pleasant and cooperative. Notes Patient's wound bed currently showed signs really of no significant slough buildup he has scar tissue which I think the collagen would help to improve overall for him if he is able to utilize this. Other than that no sharp debridement was necessary. Electronic Signature(s) Signed: 01/18/2019 6:17:25 PM By: Worthy Keeler PA-C Entered By: Worthy Keeler on 01/18/2019 18:17:24 ALLENMICHAEL, MCPARTLIN ([id number]) -------------------------------------------------------------------------------- Physician Orders Details Patient Name: [name] Date of Service: 01/18/2019 8:15  AM Medical Record Number: [id number] Patient Account Number: [id number] Date of Birth/Sex: 24-May-1980 (37 y.o. 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 Anesthetic (add to Medication List) Wound #1 Left,Lateral Lower Leg o Topical Lidocaine 4% cream applied to wound bed prior to debridement (In Clinic Only). Primary Wound Dressing Wound #1 Left,Lateral Lower Leg o Collagen Secondary Dressing Wound #1 Left,Lateral Lower Leg o Boardered Foam Dressing Dressing Change Frequency Wound #1 Left,Lateral Lower Leg o Change dressing every other day. Follow-up Appointments Wound #1 Left,Lateral Lower Leg o Return Appointment in 2 weeks. Edema Control Wound #1 Left,Lateral Lower Leg o Elevate legs to the level of the heart and pump ankles as often as possible Electronic Signature(s) Signed: 01/18/2019 4:05:51 PM By: Harold Barban Signed: 01/18/2019 6:34:02 PM By: Worthy Keeler PA-C Entered By: Harold Barban on 01/18/2019 08:49:04 Malphrus, Ariana ([id number]) TRAYVION, EMBLETON ([id number]) -------------------------------------------------------------------------------- Problem List Details Patient Name: [name] Date of Service: 01/18/2019 8:15 AM Medical Record Number: [id number] Patient Account Number: [id number] Date of Birth/Sex: March 28, 1981 (38 y.o. M) Treating RN: Harold Barban Primary Care Provider: [name] Other Clinician: Referring Provider: [name] Treating Provider/Extender: Melburn Hake,  Ruthvik Barnaby Weeks in Treatment: 110 Active Problems ICD-10 Evaluated Encounter Code Description Active Date Today Diagnosis L97.222 Non-pressure chronic ulcer of left calf with fat layer exposed 12/04/2016 No Yes L88 Pyoderma gangrenosum 03/26/2017 No Yes I87.2 Venous insufficiency (chronic) (peripheral) 12/04/2016 No Yes Inactive Problems ICD-10 Code Description Active Date Inactive Date L97.213 Non-pressure chronic ulcer of right calf with necrosis of muscle 04/02/2017 04/02/2017 Resolved Problems Electronic Signature(s) Signed: 01/18/2019 8:25:54 AM By: Worthy Keeler PA-C Entered By: Worthy Keeler on 01/18/2019 08:25:54 [name] ([id number]) -------------------------------------------------------------------------------- Progress Note Details Patient Name: [name] Date of Service: 01/18/2019 8:15 AM Medical Record Number: [id number] Patient Account Number: [id number] Date of Birth/Sex: Aug 15, 1980 (37 y.o. M) Treating RN: Harold Barban Primary Care Provider: [name] Other Clinician: Referring Provider: [name] Treating Provider/Extender: Melburn Hake, Katonya Blecher Weeks in Treatment: 110 Subjective Chief Complaint Information obtained from Patient He is here  in follow up evaluation for LLE pyoderma ulcer History of Present Illness (HPI) 12/04/16; 38 year old man who comes into the clinic today for review of a wound on the posterior left calf. He tells me that is been there for about a year. He is not a diabetic he does smoke half a pack per day. He was seen in the ER on 11/20/16 felt to have cellulitis around the wound and was given clindamycin. An x-ray did not show osteomyelitis. The patient initially tells me that he has a milk allergy that sets off a pruritic itching rash on his lower legs which she scratches incessantly and he thinks that's what may have set up the wound. He has been using various topical antibiotics and ointments without any effect. He works in a  [employer] and is on his feet all day. He does not have a prior history of wounds however he does have the rash on both lower legs the right arm and the ventral aspect of his left arm. These are excoriations and clearly have had scratching however there are of macular looking areas on both legs including a substantial larger area on the right leg. This does not have an underlying open area. There is no blistering. The patient tells me that 2 years ago in [place] in response to the rash on his legs he saw a dermatologist who told him he had a condition which may be pyoderma gangrenosum although I may be putting words into his mouth. He seemed to recognize this. 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 He is not in any treatment for this. He's had no recent travel 12/11/16; the patient arrives today with his wound and roughly the same condition we've been using silver alginate this is a deep punched out wound with some surrounding erythema but no tenderness. Biopsy I did did not show confirmed pyoderma gangrenosum suggested nonspecific inflammation and vasculitis but does not provide an actual description of what was seen by the pathologist. I'm really not able to understand this We have also received information from the patient's dermatologist in [place] notes from April 2016. This was a doctor Agarwal- antal. 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 He was prescribed topical steroid high potency under occlusion which helped but at this point the patient did not have a deep punched out wound. 12/18/16; the patient's wound is larger in terms of surface area however this surface looks better and there is less depth. The surrounding erythema also is better. The patient states that the wrap we put on came off 2 days ago when he has been using his compression stockings. He we are in the process of getting a dermatology consult. 12/26/16 on  evaluation today patient's left lower extremity wound shows evidence of infection with surrounding erythema noted. He has been tolerating the dressing changes but states that he has noted more discomfort. There is a larger area of erythema surrounding the wound. No fevers, chills, nausea, or vomiting noted at this time. With that being said the wound still does have slough covering the surface. He is not allergic to any medication that he is aware of at this point. In regard to his right lower extremity he had several regions that are erythematous and pruritic he wonders if there's anything we can do to help that. 01/02/17 I reviewed patient's wound culture which was obtained his visit last week. He was placed on doxycycline at that point. Unfortunately  that does not appear to be an antibiotic that would likely help with the situation however the pseudomonas noted on culture is sensitive to Cipro. Also unfortunately patient's wound seems to have a large compared to last week's evaluation. Not severely so but there are definitely increased measurements in general. He is continuing to have discomfort as well he writes this to be a seven out of 10. In fact he would prefer me not to perform any debridement today due to the fact that he is having discomfort and considering he has an active infection on the little reluctant to do so anyway. No fevers, Horace, Jamarkus ([id number]) chills, nausea, or vomiting noted at this time. 01/08/17; patient seems dermatology on September 5. I suspect dermatology will want the slides from the biopsy I did sent to their pathologist. I'm not sure if there is a way we can expedite that. In any case the culture I did before I left on vacation 3 weeks ago showed Pseudomonas he was given 10 days of Cipro and per her description of her intake nurses is actually somewhat better this week although the wound is quite a bit bigger than I remember the last time I saw this. He still has  3 more days of Cipro 01/21/17; dermatology appointment tomorrow. He has completed the ciprofloxacin for Pseudomonas. Surface of the wound looks better however he is had some deterioration in the lesions on his right leg. Meantime the left lateral leg wound we will continue with sample 01/29/17; patient had his dermatology appointment but I can't yet see that note. He is completed his antibiotics. The wound is more superficial but considerably larger in circumferential area than when he came in. This is in his left lateral calf. He also has swollen erythematous areas with superficial wounds on the right leg and small papular areas on both arms. There apparently areas in her his upper thighs and buttocks I did not look at those. Dermatology biopsied the right leg. Hopefully will have their input next week. 02/05/17; patient went back to see his dermatologist who told him that he had a "scratching problem" as well as staph. He is now on a 30 day course of doxycycline and I believe she gave him triamcinolone cream to the right leg areas to help with the itching [not exactly sure but probably triamcinolone]. She apparently looked at the left lateral leg wound although this was not rebiopsied and I think felt to be ultimately part of the same pathogenesis. He is using sample border foam and changing nevus himself. He now has a new open area on the right posterior leg which was his biopsy site I don't have any of the dermatology notes 02/12/17; we put the patient in compression last week with SANTYL to the wound on the left leg and the biopsy. Edema is much better and the depth of the wound is now at level of skin. Area is still the same Biopsy site on the right lateral leg we've also been using santyl with a border foam dressing and he is changing this himself. 02/19/17; Using silver alginate started last week to both the substantial left leg wound and the biopsy site on the right wound. He is tolerating  compression well. Has a an appointment with his primary M.D. tomorrow wondering about diuretics although I'm wondering if the edema problem is actually lymphedema 02/26/17; the patient has been to see his primary doctor Dr. Jerrel Ivory at [facility] our primary care. She started him on Lasix  20 mg and this seems to have helped with the edema. However we are not making substantial change with the left lateral calf wound and inflammation. The biopsy site on the right leg also looks stable but not really all that different. 03/12/17; the patient has been to see vein and vascular Dr. Lucky Cowboy. He has had venous reflux studies I have not reviewed these. I did get a call from his dermatology office. 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 They therefore recommended clobetasol and tetracycline i.e. 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 03/26/17; apparently the patient just had reflux studies not an appointment with Dr. dew. She arrives in clinic today having applied clobetasol for 2-3 weeks. He notes over the last 2-3 days excessive drainage having to change the dressing 3-4 times a day and also expanding erythema. 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 Lillia Carmel at [facility] dermatology locally who kindly saw him at my request. 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 My concern was the degree of erythema medially  extending from his left leg wound which was either inflammation from pyoderma or cellulitis. I put him on Augmentin however culture of the wound showed Pseudomonas which is quinolone sensitive. I really don't believe he has cellulitis however in view of everything I will continue and give him a course of Cipro. He is also on doxycycline as an immune modulator for the pyoderma. In addition to his original wound on the left lateral leg with surrounding erythema he has a wound on the right posterior calf which was an original biopsy site done by dermatology. 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 Saw Dr. Lillia Carmel yesterday. He has been using topical antibiotics to both wound areas his original wound on the left and the biopsies/pathergy area on the right. There is definitely some improvement in the inflammation around the wound on the right although the patient states he has increasing sensitivity of the wounds. He is on prednisone 60 and doxycycline 1 as prescribed by Dr. Lillia Carmel. He is covering the topical antibiotic with gauze and putting this in his own compression stocks and changing this daily. He states that Dr. Lottie Rater did a culture of the left leg wound yesterday 05/07/17; pyoderma gangrenosum. The patient saw Dr. Lillia Carmel yesterday and has a follow-up with her in one month. He is still using topical antibiotics to both wounds although he can't recall exactly what type. He is still on prednisone 60 mg. Dr. Lillia Carmel stated that the doxycycline could stop if we were in agreement. 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 The right medial leg was induced by [name] ([id number]) biopsy/pathergy. The area on the right is essentially healed. Still on high-dose prednisone using topical antibiotics to the wound 07/09/17;  pyoderma gangrenosum with wounds on the  left lateral leg. The right medial leg has closed and remains closed. He is still on prednisone 60. He tells me he missed his last dermatology appointment with Dr. Lillia Carmel but will make another appointment. He reports that her blood sugar at a recent screen in [place] was high 200's. He was 180 today. 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 I think he is going to require still much longer prednisone perhaps another 3 months before attempting to taper. In the meantime his wound is a lot better. Smaller. He is cleaning this off daily and applying topical antibiotics. When he was last in the clinic I thought about changing to [facility] and actually put in a couple of calls to dermatology although probably not during their business hours. 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 He continues on oral prednisone. He has been using triple antibiotic ointment. There is surface debris and we will transition to [facility] and have him return in 2 weeks. He has lost 30 pounds since his last appointment with lifestyle modification. He may benefit from topical steroid cream for treatment this can be considered at a later date. 08/22/17 on evaluation today patient appears to actually be doing rather well in regard to his left lateral lower extremity ulcer. He has actually been managed by Dr. Dellia Nims most recently. Patient is currently on oral steroids at this time. This seems to have been of benefit for him. Nonetheless his last visit was actually with Leah on 08/06/17. Currently he is not utilizing any topical steroid creams although this could be of benefit as well. No fevers, chills, nausea, or vomiting noted at this time. 09/05/17 on evaluation today patient appears to be doing better in regard to his left lateral lower extremity ulcer. He  has been tolerating the dressing changes without complication. He is using Santyl with good effect. Overall I'm very pleased with how things are standing at this point. Patient likewise is happy that this is doing better. 09/19/17 on evaluation today patient actually appears to be doing rather well in regard to his left lateral lower extremity ulcer. 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 He's not having any significant pain. 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 He has been tolerating the Santyl without complication and in general I feel like he's making good progress. 10/17/17 on evaluation today patient appears to be doing very well in regard to his left lateral lower surety ulcer. He has been tolerating the dressing changes without complication. There does not appear to be any evidence of infection he's alternating the Santyl and the triple antibiotic ointment every other day this seems to be doing well for him. 11/03/17 on evaluation today patient appears to be doing very well in regard to his left lateral lower extremity ulcer. He is been tolerating the dressing changes without complication which is good news. Fortunately there does not appear to be any evidence of infection which is also great news. Overall is doing excellent they are starting to taper down on the prednisone is down to 40 mg at this point it also started topical clobetasol for him. 11/17/17 on evaluation today patient appears to  be doing well in regard to his left lateral lower surety ulcer. He's been tolerating the dressing changes without complication. He does note that he is having no pain, no excessive drainage or discharge, and overall he feels like things are going about how he would expect and hope they would. Overall he seems to  have no evidence of infection at this time in my opinion which is good news. 12/04/17-He is seen in follow-up evaluation for right lateral lower extremity ulcer. He has been applying topical steroid cream. Today's measurement show slight increase in size. Over the next 2 weeks we will transition to every other day Santyl and steroid cream. He has been encouraged to monitor for changes and notify clinic with any concerns 12/15/17 on evaluation today patient's left lateral motion the ulcer and fortunately is doing worse again at this point. This just since last week to this week has close to doubled in size according to the patient. I did not seeing last week's I do not have a visual to compare this to in our system was also down so we do not have all the charts and at this point. Nonetheless it does have me somewhat concerned in regard to the fact that again he was worried enough about it he has contact the dermatology that placed them back on the full strength, 50 mg a day of the prednisone that he was taken previous. He continues to alternate using clobetasol along with Santyl at this point. He is obviously somewhat frustrated. 12/22/17 on evaluation today patient appears to be doing a little worse compared to last evaluation. Unfortunately the wound is a little deeper and slightly larger than the last week's evaluation. With that being said he has made some progress in regard to the irritation surrounding at this time unfortunately despite that progress that's been made he still has a significant issue going on here. I'm not certain that he is having really any true infection at this time although with the Pyoderma gangrenosum it can Boch, Dejean ([id number]) sometimes be difficult to differentiate infection versus just inflammation. For that reason I discussed with him today the possibility of perform a wound culture to ensure there's nothing overtly infected. 01/06/18 on evaluation today patient's  wound is larger and deeper than previously evaluated. With that being said it did appear that his wound was infected after my last evaluation with him. Subsequently I did end up prescribing a prescription for Bactrim DS which she has been taking and having no complication with. Fortunately there does not appear to be any evidence of infection at this point in time as far as anything spreading, no want to touch, and overall I feel like things are showing signs of improvement. 01/13/18 on evaluation today patient appears to be even a little larger and deeper than last time. There still muscle exposed in the base of the wound. Nonetheless he does appear to be less erythematous I do believe inflammation is calming down also believe the infection looks like it's probably resolved at this time based on what I'm seeing. No fevers, chills, nausea, or vomiting noted at this time. 01/30/18 on evaluation today patient actually appears to visually look better for the most part. Unfortunately those visually this looks better he does seem to potentially have what may be an abscess in the muscle that has been noted in the central portion of the wound. This is the first time that I have noted what appears to be fluctuance in the central  portion of the muscle. With that being said I'm somewhat more concerned about the fact that this might indicate an abscess formation at this location. I do believe that an ultrasound would be appropriate. This is likely something we need to try to do as soon as possible. He has been switch to mupirocin ointment and he is no longer using the steroid ointment as prescribed by dermatology he sees them again next week he's been decreased from 60 to 40 mg of prednisone. 03/09/18 on evaluation today patient actually appears to be doing a little better compared to last time I saw him. There's not as much erythema surrounding the wound itself. He I did review his most recent infectious  disease note which was dated 02/24/18. He saw Dr. Michel Bickers in [place]. 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 Patient is now off of antibiotics and they are continually observing this. There seems to be no change in the past two weeks in my pinion based on what the patient says and what I see today compared to what Dr. Megan Salon likely saw two weeks ago. No fevers, chills, nausea, or vomiting noted at this time. 03/23/18 on evaluation today patient's wound actually appears to be showing signs of improvement which is good news. He is currently still on the Dapsone. He is also working on tapering the prednisone to get off of this and Dr. Lottie Rater is working with him in this regard. Nonetheless overall I feel like the wound is doing well it does appear based on the infectious disease note that I reviewed from Dr. 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 I did also review the results of his ultrasound of left lower extremity which revealed there was a dentist tissue in the base of the wound without an abscess noted. 04/06/18 on evaluation today the patient's left lateral lower extremity ulcer actually appears to be doing fairly well which is excellent news. There does not appear to be any evidence of infection at this time which is also great news. Overall he still does have a significantly large ulceration although little by little he seems to be making progress. He is down to 10 mg a day of the prednisone. 04/20/18 on evaluation today patient actually appears to be doing excellent at this time in regard to his left lower extremity ulcer. He's making signs of good progress unfortunately this is taking much longer than we would really like to see but nonetheless he is making progress.  Fortunately there does not appear to be any evidence of infection at this time. No fevers, chills, nausea, or vomiting noted at this time. The patient has not been using the Santyl due to the cost he hadn't got in this field yet. He's mainly been using the antibiotic ointment topically. Subsequently he also tells me that he really has not been scrubbing in the shower I think this would be helpful again as I told him it doesn't have to be anything too aggressive to even make it believe just enough to keep it free of some of the loose slough and biofilm on the wound surface. 05/11/18 on evaluation today patient's wound appears to be making slow but sure progress in regard to the left lateral lower extremity ulcer. He is been tolerating  the dressing changes without complication. Fortunately there does not appear to be any evidence of infection at this time. He is still just using triple antibiotic ointment along with clobetasol occasionally over the area. He never got the Santyl and really does not seem to intend to in my pinion. 06/01/18 on evaluation today patient appears to be doing a little better in regard to his left lateral lower extremity ulcer. He states that overall he does not feel like he is doing as well with the Dapsone as he did with the prednisone. Nonetheless he sees his dermatologist later today and is gonna talk to them about the possibility of going back on the prednisone. Overall again I believe that the wound would be better if you would utilize Santyl but he really does not seem to be interested in going back to the [place] at this point. He has been using triple antibiotic ointment. POLK, MINOR ([id number]) 06/15/18 on evaluation today patient's wound actually appears to be doing about the same at this point. Fortunately there is no signs of infection at this time. He has made slight improvements although he continues to not really want to clean the wound bed at this point. He  states that he just doesn't mess with it he doesn't want to cause any problems with everything else he has going on. He has been on medication, antibiotics as prescribed by his dermatologist, for a staff infection of his lower extremities which is really drying out now and looking much better he tells me. Fortunately there is no sign of overall infection. 06/29/18 on evaluation today patient appears to be doing well in regard to his left lateral lower surety ulcer all things considering. Fortunately his staff infection seems to be greatly improved compared to previous. He has no signs of infection and this is drying up quite nicely. He is still the doxycycline for this is no longer on cental, Dapsone, or any of the other medications. His dermatologist has recommended possibility of an infusion but right now he does not want to proceed with that. 07/13/18 on evaluation today patient appears to be doing about the same in regard to his left lateral lower surety ulcer. Fortunately there's no signs of infection at this time which is great news. Unfortunately he still builds up a significant amount of Slough/biofilm of the surface of the wound he still is not really cleaning this as he should be appropriately. Again I'm able to easily with saline and gauze remove the majority of this on the surface which if you would do this at home would likely be a dramatic improvement for him as far as getting the area to improve. Nonetheless overall I still feel like he is making progress is just very slow. I think Santyl will be of benefit for him as well. Still he has not gotten this as of this point. 07/27/18 on evaluation today patient actually appears to be doing little worse in regards of the erythema around the periwound region of the wound he also tells me that he's been having more drainage currently compared to what he was experiencing last time I saw him. He states not quite as bad as what he had because this  was infected previously but nonetheless is still appears to be doing poorly. Fortunately there is no evidence of systemic infection at this point. The patient tells me that he is not going to be able to afford the Santyl. He is still waiting to hear about the infusion  therapy with his dermatologist. Apparently she wants an updated colonoscopy first. 08/10/18 on evaluation today patient appears to be doing better in regard to his left lateral lower extremity ulcer. Fortunately he is showing signs of improvement in this regard he's actually been approved for Remicade infusion's as well although this has not been scheduled as of yet. Fortunately there's no signs of active infection at this time in regard to the wound although he is having some issues with infection of the right lower extremity is been seen as dermatologist for this. Fortunately they are definitely still working with him trying to keep things under control. 09/07/18 on evaluation today patient is actually doing rather well in regard to his left lateral lower extremity ulcer. He notes these actually having some hair grow back on his extremity which is something he has not seen in years. He also tells me that the pain is really not giving them any trouble at this time which is also good news overall she is very pleased with the progress he's using a combination of the mupirocin along with the probate is all mixed. 09/21/18 on evaluation today patient actually appears to be doing fairly well all things considered in regard to his looks from the ulcer. He's been tolerating the dressing changes without complication. Fortunately there's no signs of active infection at this time which is good news he is still on all antibiotics or prevention of the staff infection. He has been on prednisone for time although he states it is gonna contact his dermatologist and see if she put them on a short course due to some irritation that he has going on  currently. Fortunately there's no evidence of any overall worsening this is going very slow I think cental would be something that would be helpful for him although he states that $50 for tube is quite expensive. He therefore is not willing to get that at this point. 10/06/18 on evaluation today patient actually appears to be doing decently well in regard to his left lateral leg ulcer. He's been tolerating the dressing changes without complication. Fortunately there's no signs of active infection at this time. Overall I'm actually rather pleased with the progress he's making although it's slow he doesn't show any signs of infection and he does seem to be making some improvement. I do believe that he may need a switch up and dressings to try to help this to heal more appropriately and quickly. 10/19/18 on evaluation today patient actually appears to be doing better in regard to his left lateral lower extremity ulcer. This is shown signs of having much less Slough buildup at this point due to the fact he has been using the [employer]. Obviously this is very good news. The overall size of the wound is not dramatically smaller but again the appearance is. 11/02/18 on evaluation today patient actually appears to be doing quite well in regard to his lower [place] ulcer. A lot of the skin around the ulcer is actually somewhat irritating at this point this seems to be more due to the dressing causing irritation from the adhesive that anything else. Fortunately there is no signs of active infection at this time. 11/24/18 on evaluation today patient appears to be doing a little worse in regard to his overall appearance of his lower extremity Sakurai, Nicolae ([id number]) ulcer. There's more erythema and warmth around the wound unfortunately. He is currently on doxycycline which he has been on for some time. With that being said I'm  not sure that seems to be helping with what appears to possibly be an acute cellulitis with  regard to his left lower extremity ulcer. No fevers, chills, nausea, or vomiting noted at this time. 12/08/18 on evaluation today patient's wounds actually appears to be doing significantly better compared to his last evaluation. He has been using Santyl along with alternating tripling about appointment as well as the steroid cream seems to be doing quite well and the wound is showing signs of improvement which is excellent news. Fortunately there's no evidence of infection and in fact his culture came back negative with only normal skin flora noted. 12/21/2018 upon evaluation today patient actually appears to be doing excellent with regard to his ulcer. This is actually the best that I have seen it since have been helping to take care of him. It is both smaller as well as less slough noted on the surface of the wound and seems to be showing signs of good improvement with new skin growing from the edges. He has been using just the triamcinolone he does wonder if he can get a refill of that ointment today. 01/04/2019 upon evaluation today patient actually appears to be doing well with regard to his left lateral lower extremity ulcer. With that being said it does not appear to be that he is doing quite as well as last time as far as progression is concerned. There does not appear to be any signs of infection or significant irritation which is good news. With that being said I do believe that he may benefit from switching to a collagen based dressing based on how clean The wound appears. 01/18/2019 on evaluation today patient actually appears to be doing well with regard to his wound on the left lower extremity. He is not made a lot of progress compared to where we were previous but nonetheless does seem to be doing okay at this time which is good news. There is no signs of active infection which is also good news. My only concern currently is I do wish we can get him into utilizing the collagen dressing his  insurance would not pay for the supplies that we ordered although it appears that he may be able to order this through his supply company that he typically utilizes. This is [employer]. Nonetheless he did try to order it during the office visit today and it appears this did go through. We will see if he can get that it is a different brand but nonetheless he has collagen and I do think will be beneficial. Patient History Information obtained from Patient. Family History Diabetes - Mother,Maternal Grandparents, Heart Disease - Mother,Maternal Grandparents, Hereditary Spherocytosis - Siblings, Hypertension - Maternal Grandparents, Stroke - Siblings, No family history of Cancer, Kidney Disease, Lung Disease, Seizures, Thyroid Problems, Tuberculosis. Social History Current some day smoker, Marital Status - Married, Alcohol Use - Rarely, Drug Use - No History, Caffeine Use - Moderate. Medical History Eyes Denies history of Cataracts, Glaucoma, Optic Neuritis Ear/Nose/Mouth/Throat Denies history of Chronic sinus problems/congestion, Middle ear problems Hematologic/Lymphatic Denies history of Anemia, Hemophilia, Human Immunodeficiency Virus, Lymphedema, Sickle Cell Disease Respiratory Patient has history of Sleep Apnea - cpap Denies history of Aspiration, Asthma, Chronic Obstructive Pulmonary Disease (COPD), Pneumothorax Cardiovascular Patient has history of Hypertension Denies history of Angina, Arrhythmia, Congestive Heart Failure, Coronary Artery Disease, Deep Vein Thrombosis, Hypotension, Myocardial Infarction, Peripheral Arterial Disease, Peripheral Venous Disease, Phlebitis, Vasculitis Gastrointestinal Patient has history of Colitis Endocrine Denies history of Type I  Diabetes, Type II Diabetes Genitourinary Denies history of End Stage Renal Disease Immunological UTAH, DELAUDER ([id number]) Denies history of Lupus Erythematosus, Raynaud s, Scleroderma Integumentary (Skin) Denies history  of History of Burn, History of pressure wounds Musculoskeletal Denies history of Gout, Rheumatoid Arthritis, Osteoarthritis, Osteomyelitis Neurologic Denies history of Dementia, Neuropathy, Quadriplegia, Paraplegia, Seizure Disorder Oncologic Denies history of Received Chemotherapy, Received Radiation Psychiatric Denies history of Anorexia/bulimia, Confinement Anxiety Review of Systems (ROS) Constitutional Symptoms (General Health) Denies complaints or symptoms of Fatigue, Fever, Chills, Marked Weight Change. Respiratory Denies complaints or symptoms of Chronic or frequent coughs, Shortness of Breath. Cardiovascular Complains or has symptoms of LE edema. Denies complaints or symptoms of Chest pain. Psychiatric Denies complaints or symptoms of Anxiety, Claustrophobia. Objective Constitutional Well-nourished and well-hydrated in no acute distress. Vitals Time Taken: 8:23 AM, Height: 71 in, Weight: 338 lbs, BMI: 47.1, Temperature: 98.5 F, Pulse: 85 bpm, Respiratory Rate: 18 breaths/min, Blood Pressure: 150/77 mmHg. Respiratory normal breathing without difficulty. clear to auscultation bilaterally. Cardiovascular regular rate and rhythm with normal S1, S2. Psychiatric this patient is able to make decisions and demonstrates good insight into disease process. Alert and Oriented x 3. pleasant and cooperative. General Notes: Patient's wound bed currently showed signs really of no significant slough buildup he has scar tissue which I think the collagen would help to improve overall for him if he is able to utilize this. Other than that no sharp debridement was necessary. Integumentary (Hair, Skin) Wound #1 status is Open. Original cause of wound was Gradually Appeared. The wound is located on the Left,Lateral Lower Leg. The wound measures 3.1cm length x 1.8cm width x 0.2cm depth; 4.383cm^2 area and 0.877cm^3 volume. There is Fat Layer (Subcutaneous Tissue) Exposed exposed. There is no  tunneling or undermining noted. There is a medium amount of Mikkelsen, Kazuo ([id number]) serous drainage noted. The wound margin is distinct with the outline attached to the wound base. There is medium (34-66%) red granulation within the wound bed. There is a medium (34-66%) amount of necrotic tissue within the wound bed including Adherent Slough. 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 Anesthetic (add to Medication List): Wound #1 Left,Lateral Lower Leg: Topical Lidocaine 4% cream applied to wound bed prior to debridement (In Clinic Only). Primary Wound Dressing: Wound #1 Left,Lateral Lower Leg: Collagen Secondary Dressing: Wound #1 Left,Lateral Lower Leg: Boardered Foam Dressing Dressing Change Frequency: Wound #1 Left,Lateral Lower Leg: Change dressing every other day. Follow-up Appointments: Wound #1 Left,Lateral Lower Leg: Return Appointment in 2 weeks. Edema Control: Wound #1 Left,Lateral Lower Leg: Elevate legs to the level of the heart and pump ankles as often as possible 1. I am in a suggest that we continue with the collagen dressings for the time being and hopefully he will be able to get the order that he placed today. 2. I am good also suggest that he continue to alternate where he applies the dressing externally so that the adhesive would not cause too much skin breakdown. We will see patient back for reevaluation in 2 weeks here in the clinic. If anything worsens or changes patient will contact our office for additional recommendations. Electronic Signature(s) ZAIAH, ECKERSON ([id number]) Signed: 01/18/2019 6:18:10 PM By: Worthy Keeler PA-C Entered By: Worthy Keeler on 01/18/2019 18:18:10 Fahrney, [name]  ([id number]) -------------------------------------------------------------------------------- ROS/PFSH Details Patient Name: [name] Date of Service:  01/18/2019 8:15 AM Medical Record Number: [id number] Patient Account Number: [id number] Date of Birth/Sex: 03-Jan-1981 (37 y.o. M) Treating RN: Harold Barban Primary Care Provider: [name] Other Clinician: Referring Provider: [name] Treating Provider/Extender: Melburn Hake, Kaedence Connelly Weeks in Treatment: 110 Information Obtained From Patient Constitutional Symptoms (General Health) Complaints and Symptoms: Negative for: Fatigue; Fever; Chills; Marked Weight Change Respiratory Complaints and Symptoms: Negative for: Chronic or frequent coughs; Shortness of Breath Medical History: Positive for: Sleep Apnea - cpap Negative for: Aspiration; Asthma; Chronic Obstructive Pulmonary Disease (COPD); Pneumothorax Cardiovascular Complaints and Symptoms: Positive for: LE edema Negative for: Chest pain Medical History: Positive for: Hypertension Negative for: Angina; Arrhythmia; Congestive Heart Failure; Coronary Artery Disease; Deep Vein Thrombosis; Hypotension; Myocardial Infarction; Peripheral Arterial Disease; Peripheral Venous Disease; Phlebitis; Vasculitis Psychiatric Complaints and Symptoms: Negative for: Anxiety; Claustrophobia Medical History: Negative for: Anorexia/bulimia; Confinement Anxiety Eyes Medical History: Negative for: Cataracts; Glaucoma; Optic Neuritis Ear/Nose/Mouth/Throat Medical History: Negative for: Chronic sinus problems/congestion; Middle ear problems Hematologic/Lymphatic Medical History: Negative for: Anemia; Hemophilia; Human Immunodeficiency Virus; Lymphedema; Sickle Cell Disease Encalada, Paiton ([id number]) Gastrointestinal Medical History: Positive for: Colitis Endocrine Medical History: Negative for: Type I Diabetes; Type II Diabetes Genitourinary Medical History: Negative for: End  Stage Renal Disease Immunological Medical History: Negative for: Lupus Erythematosus; Raynaudos; Scleroderma Integumentary (Skin) Medical History: Negative for: History of Burn; History of pressure wounds Musculoskeletal Medical History: Negative for: Gout; Rheumatoid Arthritis; Osteoarthritis; Osteomyelitis Neurologic Medical History: Negative for: Dementia; Neuropathy; Quadriplegia; Paraplegia; Seizure Disorder Oncologic Medical History: Negative for: Received Chemotherapy; Received Radiation Immunizations Pneumococcal Vaccine: Received Pneumococcal Vaccination: No Implantable Devices No devices added Family and Social History Cancer: No; Diabetes: Yes - Mother,Maternal Grandparents; Heart Disease: Yes - Mother,Maternal Grandparents; Hereditary Spherocytosis: Yes - Siblings; Hypertension: Yes - Maternal Grandparents; Kidney Disease: No; Lung Disease: No; Seizures: No; Stroke: Yes - Siblings; Thyroid Problems: No; Tuberculosis: No; Current some day smoker; Marital Status - Married; Alcohol Use: Rarely; Drug Use: No History; Caffeine Use: Moderate; Financial Concerns: No; Food, Clothing or Shelter Needs: No; Support System Lacking: No; Transportation Concerns: No Physician Affirmation I have reviewed and agree with the above information. Electronic Signature(s) SYRIS, BROOKENS ([id number]) Signed: 01/18/2019 6:34:02 PM By: Worthy Keeler PA-C Signed: 01/20/2019 4:31:02 PM By: Harold Barban Entered By: Worthy Keeler on 01/18/2019 18:17:13 BRODEY, BONN ([id number]) -------------------------------------------------------------------------------- SuperBill Details Patient Name: [name] Date of Service: 01/18/2019 Medical Record Number: [id number] Patient Account Number: [id number] Date of Birth/Sex: 01-Aug-1980 (38 y.o. 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

## 2019-01-18 NOTE — Telephone Encounter (Signed)
[name] at [facility] said faxed over [phone] with an order for collagen wound dressing. I asked if that should go to wound care office but was advised to send to PCP.Please advise.

## 2019-01-18 NOTE — Telephone Encounter (Signed)
The faxed order form is in [name]'s inbox.

## 2019-01-19 NOTE — Progress Notes (Signed)
[name], [name] ([id number]) Visit Report for 01/18/2019 Arrival Information Details Patient Name: [name], [name] Date of Service: 01/18/2019 8:15 AM Medical Record Number: [id number] Patient Account Number: [id number] Date of Birth/Sex: 06-27-1980 (38 y.o. M) Treating RN: Montey Hora Primary Care Shakevia Sarris: [name] Other Clinician: Referring Talajah Slimp: [name] Treating Jazmin Ley/Extender: Melburn Hake, [name]: 110 Visit Information History Since Last Visit Added or deleted any medications: No Patient Arrived: Ambulatory Any new allergies or adverse reactions: No Arrival Time: 08:21 Had a fall or experienced change in No Accompanied By: self activities of daily living that may affect Transfer Assistance: None risk of falls: Patient Identification Verified: Yes Signs or symptoms of abuse/neglect since last visito No Secondary Verification Process Completed: Yes Hospitalized since last visit: No Patient Requires Transmission-Based No Implantable device outside of the clinic excluding No Precautions: cellular tissue based products placed in the center Patient Has Alerts: No since last visit: Has Dressing in Place as Prescribed: Yes Has Compression in Place as Prescribed: Yes Pain Present Now: No Electronic Signature(s) Signed: 01/18/2019 3:47:55 PM By: Montey Hora Entered By: Montey Hora on 01/18/2019 08:22:41 [name] ([id number]) -------------------------------------------------------------------------------- Clinic Level of Care Assessment Details Patient Name: [name] Date of Service: 01/18/2019 8:15 AM Medical Record Number: [id number] Patient Account Number: [id number] Date of Birth/Sex: [birth year] (37 y.o. M) Treating RN: Harold Barban Primary Care Carlo Guevarra: [name] Other Clinician: Referring Norlan Rann: [name] Treating Mael Delap/Extender: Melburn Hake, [name]: 110 Clinic Level of Care Assessment  Items TOOL 4 Quantity Score []  - Use when only an EandM is performed on FOLLOW-UP visit 0 ASSESSMENTS - Nursing Assessment / Reassessment X - Reassessment of Co-morbidities (includes updates in patient status) 1 10 X- 1 5 Reassessment of Adherence to Treatment Plan ASSESSMENTS - Wound and Skin Assessment / Reassessment X - Simple Wound Assessment / Reassessment - one wound 1 5 []  - 0 Complex Wound Assessment / Reassessment - multiple wounds []  - 0 Dermatologic / Skin Assessment (not related to wound area) ASSESSMENTS - Focused Assessment []  - Circumferential Edema Measurements - multi extremities 0 []  - 0 Nutritional Assessment / Counseling / Intervention []  - 0 Lower Extremity Assessment (monofilament, tuning fork, pulses) []  - 0 Peripheral Arterial Disease Assessment (using hand held doppler) ASSESSMENTS - Ostomy and/or Continence Assessment and Care []  - Incontinence Assessment and Management 0 []  - 0 Ostomy Care Assessment and Management (repouching, etc.) PROCESS - Coordination of Care X - Simple Patient / Family Education for ongoing care 1 15 []  - 0 Complex (extensive) Patient / Family Education for ongoing care []  - 0 Staff obtains Programmer, systems, Records, Test Results / Process Orders []  - 0 Staff telephones HHA, Nursing Homes / Clarify orders / etc []  - 0 Routine Transfer to another Facility (non-emergent condition) []  - 0 Routine Hospital Admission (non-emergent condition) []  - 0 New Admissions / Biomedical engineer / Ordering NPWT, Apligraf, etc. []  - 0 Emergency Hospital Admission (emergent condition) X- 1 10 Simple Discharge Coordination [name] ([id number]) []  - 0 Complex (extensive) Discharge Coordination PROCESS - Special Needs []  - Pediatric / Minor Patient Management 0 []  - 0 Isolation Patient Management []  - 0 Hearing / Language / Visual special needs []  - 0 Assessment of Community assistance (transportation, D/C planning, etc.) []  -  0 Additional assistance / Altered mentation []  - 0 Support Surface(s) Assessment (bed, cushion, seat, etc.) INTERVENTIONS - Wound Cleansing / Measurement X - Simple Wound Cleansing - one wound 1 5 []  - 0  Complex Wound Cleansing - multiple wounds X- 1 5 Wound Imaging (photographs - any number of wounds) []  - 0 Wound Tracing (instead of photographs) X- 1 5 Simple Wound Measurement - one wound []  - 0 Complex Wound Measurement - multiple wounds INTERVENTIONS - Wound Dressings X - Small Wound Dressing one or multiple wounds 1 10 []  - 0 Medium Wound Dressing one or multiple wounds []  - 0 Large Wound Dressing one or multiple wounds []  - 0 Application of Medications - topical []  - 0 Application of Medications - injection INTERVENTIONS - Miscellaneous []  - External ear exam 0 []  - 0 Specimen Collection (cultures, biopsies, blood, body fluids, etc.) []  - 0 Specimen(s) / Culture(s) sent or taken to Lab for analysis []  - 0 Patient Transfer (multiple staff / Civil Service fast streamer / Similar devices) []  - 0 Simple Staple / Suture removal (25 or less) []  - 0 Complex Staple / Suture removal (26 or more) []  - 0 Hypo / Hyperglycemic Management (close monitor of Blood Glucose) []  - 0 Ankle / Brachial Index (ABI) - do not check if billed separately X- 1 5 Vital Signs [name] ([id number]) Has the patient been seen at the hospital within the last three years: Yes Total Score: 75 Level Of Care: New/Established - Level 2 Electronic Signature(s) Signed: 01/18/2019 4:05:51 PM By: Harold Barban Entered By: Harold Barban on 01/18/2019 08:43:13 [name] ([id number]) -------------------------------------------------------------------------------- Encounter Discharge Information Details Patient Name: [name] Date of Service: 01/18/2019 8:15 AM Medical Record Number: [id number] Patient Account Number: [id number] Date of Birth/Sex: Dec 19, 1980 (37 y.o. M) Treating RN: Harold Barban Primary Care [name] [name]: [name] Other Clinician: Referring Toyoko Silos: [name] Treating Tikita Mabee/Extender: Melburn Hake, [name]: 110 Encounter Discharge Information Items Discharge Condition: Stable Ambulatory Status: Ambulatory Discharge Destination: Home Transportation: Private Auto Accompanied By: self Schedule Follow-up Appointment: Yes Clinical Summary of Care: Electronic Signature(s) Signed: 01/18/2019 4:05:51 PM By: Harold Barban Entered By: Harold Barban on 01/18/2019 08:50:00 [name] ([id number]) -------------------------------------------------------------------------------- Lower Extremity Assessment Details Patient Name: [name] Date of Service: 01/18/2019 8:15 AM Medical Record Number: [id number] Patient Account Number: [id number] Date of Birth/Sex: 04/04/1981 (37 y.o. M) Treating RN: Montey Hora Primary Care Melinda Pottinger: [name] Other Clinician: Referring Vana Arif: [name] Treating Kimi Bordeau/Extender: STONE III, [name]: 110 Edema Assessment Assessed: [Left: No] [Right: No] Edema: [Left: Ye] [Right: s] Vascular Assessment Pulses: Dorsalis Pedis Palpable: [Left:Yes] Electronic Signature(s) Signed: 01/18/2019 3:47:55 PM By: Montey Hora Entered By: Montey Hora on 01/18/2019 08:27:27 [name] ([id number]) -------------------------------------------------------------------------------- Multi Wound Chart Details Patient Name: [name] Date of Service: 01/18/2019 8:15 AM Medical Record Number: [id number] Patient Account Number: [id number] Date of Birth/Sex: 09-10-1980 (37 y.o. M) Treating RN: Harold Barban Primary Care Ricki Vanhandel: [name] Other Clinician: Referring Deandra Goering: [name] Treating Marlie Kuennen/Extender: Melburn Hake, [name]: 110 Vital Signs Height(in): 71 Pulse(bpm): 85 Weight(lbs): 338 Blood Pressure(mmHg): 150/77 Body Mass  Index(BMI): 47 Temperature(F): 98.5 Respiratory Rate 18 (breaths/min): Photos: [N/A:N/A] Wound Location: Left Lower Leg - Lateral N/A N/A Wounding Event: Gradually Appeared N/A N/A Primary Etiology: Pyoderma N/A N/A Comorbid History: Sleep Apnea, Hypertension, N/A N/A Colitis Date Acquired: 11/18/2015 N/A N/A Weeks of Treatment: 110 N/A N/A Wound Status: Open N/A N/A Measurements L x W x D 3.1x1.8x0.2 N/A N/A (cm) Area (cm) : 4.383 N/A N/A Volume (cm) : 0.877 N/A N/A % Reduction in Area: 10.70% N/A N/A % Reduction in Volume: 77.70% N/A N/A Classification: Full Thickness With Exposed N/A N/A Support Structures  Exudate Amount: Medium N/A N/A Exudate Type: Serous N/A N/A Exudate Color: amber N/A N/A Wound Margin: Distinct, outline attached N/A N/A Granulation Amount: Medium (34-66%) N/A N/A Granulation Quality: Red N/A N/A Necrotic Amount: Medium (34-66%) N/A N/A Exposed Structures: Fat Layer (Subcutaneous N/A N/A Tissue) Exposed: Yes Fascia: No Tendon: No Muscle: No Mccuiston, Denny ([id number]) Joint: No Bone: No Epithelialization: None N/A N/A Treatment Notes Electronic Signature(s) Signed: 01/18/2019 4:05:51 PM By: Harold Barban Entered By: Harold Barban on 01/18/2019 08:41:44 [name] ([id number]) -------------------------------------------------------------------------------- Multi-Disciplinary Care Plan Details Patient Name: [name] Date of Service: 01/18/2019 8:15 AM Medical Record Number: [id number] Patient Account Number: [id number] Date of Birth/Sex: 11-Oct-1980 (37 y.o. M) Treating RN: Harold Barban Primary Care Shenelle Klas: [name] Other Clinician: Referring Avika Carbine: [name] Treating Shavar Gorka/Extender: Melburn Hake, [name]: 110 Active Inactive Orientation to the Wound Care Program Nursing Diagnoses: Knowledge deficit related to the wound healing center program Goals: Patient/caregiver will verbalize  understanding of the [facility] Program Date Initiated: 12/11/2016 Target Resolution Date: 03/13/2017 Goal Status: Active Interventions: Provide education on orientation to the wound center Notes: Venous Leg Ulcer Nursing Diagnoses: Knowledge deficit related to disease process and management Potential for venous Insuffiency (use before diagnosis confirmed) Goals: Non-invasive venous studies are completed as ordered Date Initiated: 12/11/2016 Target Resolution Date: 03/13/2017 Goal Status: Active Patient will maintain optimal edema control Date Initiated: 12/11/2016 Target Resolution Date: 03/13/2017 Goal Status: Active Patient/caregiver will verbalize understanding of disease process and disease management Date Initiated: 12/11/2016 Target Resolution Date: 03/13/2017 Goal Status: Active Verify adequate tissue perfusion prior to therapeutic compression application Date Initiated: 12/11/2016 Target Resolution Date: 03/13/2017 Goal Status: Active Interventions: Assess peripheral edema status every visit. Compression as ordered Provide education on venous insufficiency Treatment Activities: PETRA, SARGEANT ([id number]) Therapeutic compression applied : 12/11/2016 Notes: Wound/Skin Impairment Nursing Diagnoses: Impaired tissue integrity Knowledge deficit related to ulceration/compromised skin integrity Goals: Patient/caregiver will verbalize understanding of skin care regimen Date Initiated: 12/11/2016 Target Resolution Date: 03/13/2017 Goal Status: Active Ulcer/skin breakdown will have a volume reduction of 30% by week 4 Date Initiated: 12/11/2016 Target Resolution Date: 03/13/2017 Goal Status: Active Ulcer/skin breakdown will have a volume reduction of 50% by week 8 Date Initiated: 12/11/2016 Target Resolution Date: 03/13/2017 Goal Status: Active Ulcer/skin breakdown will have a volume reduction of 80% by week 12 Date Initiated: 12/11/2016 Target Resolution Date:  03/13/2017 Goal Status: Active Ulcer/skin breakdown will heal within 14 weeks Date Initiated: 12/11/2016 Target Resolution Date: 03/13/2017 Goal Status: Active Interventions: Assess patient/caregiver ability to obtain necessary supplies Assess patient/caregiver ability to perform ulcer/skin care regimen upon admission and as needed Assess ulceration(s) every visit Provide education on ulcer and skin care Treatment Activities: Skin care regimen initiated : 12/11/2016 Topical wound management initiated : 12/11/2016 Notes: Electronic Signature(s) Signed: 01/18/2019 4:05:51 PM By: Harold Barban Entered By: Harold Barban on 01/18/2019 08:41:28 [name] ([id number]) -------------------------------------------------------------------------------- Pain Assessment Details Patient Name: [name] Date of Service: 01/18/2019 8:15 AM Medical Record Number: [id number] Patient Account Number: [id number] Date of Birth/Sex: [birth year] (37 y.o. M) Treating RN: Montey Hora Primary Care Arlisa Leclere: [name] Other Clinician: Referring Blas Riches: [name] Treating Dean Wonder/Extender: Melburn Hake, [name]: 110 Active Problems Location of Pain Severity and Description of Pain Patient Has Paino Yes Site Locations Rate the pain. Current Pain Level: 3 Pain Management and Medication Current Pain Management: Electronic Signature(s) Signed: 01/18/2019 3:47:55 PM By: Montey Hora Entered By: Montey Hora on 01/18/2019 08:23:13 Giroux, [name] ([id number]) -------------------------------------------------------------------------------- Patient/Caregiver Education  Details Patient Name: KINSEY, COWSERT Date of Service: 01/18/2019 8:15 AM Medical Record Number: [id number] Patient Account Number: [id number] Date of Birth/Gender: August 13, 1980 (38 y.o. M) Treating RN: Harold Barban Primary Care Physician: [name] Other Clinician: Referring Physician: [name] Treating Physician/Extender: Sharalyn Ink in Treatment: 110 Education Assessment Education Provided To: Patient Education Topics Provided Venous: Handouts: Controlling Swelling with Compression Stockings Methods: Demonstration, Explain/Verbal Responses: State content correctly Wound/Skin Impairment: Handouts: Caring for Your Ulcer Methods: Demonstration, Explain/Verbal Responses: State content correctly Electronic Signature(s) Signed: 01/18/2019 4:05:51 PM By: Harold Barban Entered By: Harold Barban on 01/18/2019 08:42:10 [name] ([id number]) -------------------------------------------------------------------------------- Wound Assessment Details Patient Name: [name] Date of Service: 01/18/2019 8:15 AM Medical Record Number: [id number] Patient Account Number: [id number] Date of Birth/Sex: 02/01/1981 (37 y.o. M) Treating RN: Montey Hora Primary Care Dorien Bessent: [name] Other Clinician: Referring Doniqua Saxby: [name] Treating Dolores Ewing/Extender: Melburn Hake, [name]: 110 Wound Status Wound Number: 1 Primary Etiology: Pyoderma Wound Location: Left Lower Leg - Lateral Wound Status: Open Wounding Event: Gradually Appeared Comorbid History: Sleep Apnea, Hypertension, Colitis Date Acquired: 11/18/2015 Weeks Of Treatment: 110 Clustered Wound: No Photos Wound Measurements Length: (cm) 3.1 % Reduction Width: (cm) 1.8 % Reduction Depth: (cm) 0.2 Epitheliali Area: (cm) 4.383 Tunneling: Volume: (cm) 0.877 Underminin in Area: 10.7% in Volume: 77.7% zation: None No g: No Wound Description Full Thickness With Exposed Support Foul Odor A Classification: Structures Slough/Fibr Wound Margin: Distinct, outline attached Exudate Medium Amount: Exudate Type: Serous Exudate Color: amber fter Cleansing: No ino Yes Wound Bed Granulation Amount: Medium (34-66%) Exposed Structure Granulation Quality: Red Fascia Exposed:  No Necrotic Amount: Medium (34-66%) Fat Layer (Subcutaneous Tissue) Exposed: Yes Necrotic Quality: Adherent Slough Tendon Exposed: No Muscle Exposed: No Joint Exposed: No Bone Exposed: No Rueter, Treyon ([id number]) Treatment Notes Wound #1 (Left, Lateral Lower Leg) Notes collagen, BFD Electronic Signature(s) Signed: 01/18/2019 3:47:55 PM By: Montey Hora Entered By: Montey Hora on 01/18/2019 08:26:51 [name] ([id number]) -------------------------------------------------------------------------------- [place] Details Patient Name: [name] Date of Service: 01/18/2019 8:15 AM Medical Record Number: [id number] Patient Account Number: [id number] Date of Birth/Sex: [birth year] (37 y.o. M) Treating RN: Montey Hora Primary Care Asyria [name]: [name] Other Clinician: Referring Charlei Ramsaran: [name] Treating Chalsea Darko/Extender: Melburn Hake, [name]: 110 Vital Signs Time Taken: 08:23 Temperature (F): 98.5 Height (in): 71 Pulse (bpm): 85 Weight (lbs): 338 Respiratory Rate (breaths/min): 18 Body Mass Index (BMI): 47.1 Blood Pressure (mmHg): 150/77 Reference Range: 80 - 120 mg / dl Electronic Signature(s) Signed: 01/18/2019 3:47:55 PM By: Montey Hora Entered By: Montey Hora on 01/18/2019 08:24:26

## 2019-01-19 NOTE — Telephone Encounter (Signed)
Completed and placed in Chans inbox. 

## 2019-01-20 NOTE — Telephone Encounter (Signed)
Faxed as requested

## 2019-02-01 ENCOUNTER — Other Ambulatory Visit: Payer: Self-pay

## 2019-02-01 ENCOUNTER — Encounter: Payer: 59 | Attending: Physician Assistant | Admitting: Physician Assistant

## 2019-02-01 ENCOUNTER — Other Ambulatory Visit
Admission: RE | Admit: 2019-02-01 | Discharge: 2019-02-01 | Disposition: A | Discharge status: Home / Self Care | Payer: 59 | Source: Ambulatory Visit | Attending: Physician Assistant | Admitting: Physician Assistant

## 2019-02-01 DIAGNOSIS — G4730 Sleep apnea, unspecified: Secondary | ICD-10-CM | POA: Insufficient documentation

## 2019-02-01 DIAGNOSIS — L88 Pyoderma gangrenosum: Secondary | ICD-10-CM | POA: Diagnosis not present

## 2019-02-01 DIAGNOSIS — B999 Unspecified infectious disease: Secondary | ICD-10-CM | POA: Diagnosis not present

## 2019-02-01 DIAGNOSIS — L97222 Non-pressure chronic ulcer of left calf with fat layer exposed: Secondary | ICD-10-CM | POA: Insufficient documentation

## 2019-02-01 DIAGNOSIS — I872 Venous insufficiency (chronic) (peripheral): Secondary | ICD-10-CM | POA: Diagnosis not present

## 2019-02-01 DIAGNOSIS — I10 Essential (primary) hypertension: Secondary | ICD-10-CM | POA: Diagnosis not present

## 2019-02-01 DIAGNOSIS — F17200 Nicotine dependence, unspecified, uncomplicated: Secondary | ICD-10-CM | POA: Insufficient documentation

## 2019-02-01 NOTE — Progress Notes (Addendum)
JEB, SCHLOEMER ([id number]) Visit Report for 02/01/2019 Chief Complaint Document Details Patient Name: [name], [name] Date of Service: 02/01/2019 8:15 AM Medical Record Number: [id number] Patient Account Number: [id number] Date of Birth/Sex: 11/23/1980 (39 y.o. M) Treating RN: Harold Barban Primary Care Provider: [name] Other Clinician: Referring Provider: [name] Treating Provider/Extender: Melburn Hake, Terri Malerba Weeks in Treatment: 112 Information Obtained from: Patient Chief Complaint He is here in follow up evaluation for LLE pyoderma ulcer Electronic Signature(s) Signed: 02/01/2019 8:21:42 AM By: Worthy Keeler PA-C Entered By: Worthy Keeler on 02/01/2019 08:21:42 ASIR, BINGLEY ([id number]) -------------------------------------------------------------------------------- HPI Details Patient Name: [name] Date of Service: 02/01/2019 8:15 AM Medical Record Number: [id number] Patient Account Number: [id number] Date of Birth/Sex: Feb 19, 1981 (38 y.o. M) Treating RN: Harold Barban Primary Care Provider: [name] Other Clinician: Referring Provider: [name] Treating Provider/Extender: Melburn Hake, Amariyon Maynes Weeks in Treatment: 112 History of Present Illness HPI Description: 12/04/16; 37 year old man who comes into the clinic today for review of a wound on the posterior left calf. He tells me that is been there for about a year. He is not a diabetic he does smoke half a pack per day. He was seen in the ER on 11/20/16 felt to have cellulitis around the wound and was given clindamycin. An x-ray did not show osteomyelitis. The patient initially tells me that he has a milk allergy that sets off a pruritic itching rash on his lower legs which she scratches incessantly and he thinks that's what may have set up the wound. He has been using various topical antibiotics and ointments without any effect. He works in a [employer] and is on his feet all day. He does not have a  prior history of wounds however he does have the rash on both lower legs the right arm and the ventral aspect of his left arm. These are excoriations and clearly have had scratching however there are of macular looking areas on both legs including a substantial larger area on the right leg. This does not have an underlying open area. There is no blistering. The patient tells me that 2 years ago in [place] in response to the rash on his legs he saw a dermatologist who told him he had a condition which may be pyoderma gangrenosum although I may be putting words into his mouth. He seemed to recognize this. 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 He is not in any treatment for this. He's had no recent travel 12/11/16; the patient arrives today with his wound and roughly the same condition we've been using silver alginate this is a deep punched out wound with some surrounding erythema but no tenderness. Biopsy I did did not show confirmed pyoderma gangrenosum suggested nonspecific inflammation and vasculitis but does not provide an actual description of what was seen by the pathologist. I'm really not able to understand this We have also received information from the patient's dermatologist in [place] notes from April 2016. This was a doctor Agarwal- antal. 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 He was prescribed topical steroid high potency under occlusion which helped but at this point the patient did not have a deep punched out wound. 12/18/16; the patient's wound is larger in terms of surface area however this surface looks better and there is less depth. The surrounding erythema also is better. The patient states that the wrap we put on came off 2 days ago when he has been using  his compression stockings. He we are in the process of getting a dermatology consult. 12/26/16 on evaluation today patient's left lower extremity wound shows evidence  of infection with surrounding erythema noted. He has been tolerating the dressing changes but states that he has noted more discomfort. There is a larger area of erythema surrounding the wound. No fevers, chills, nausea, or vomiting noted at this time. With that being said the wound still does have slough covering the surface. He is not allergic to any medication that he is aware of at this point. In regard to his right lower extremity he had several regions that are erythematous and pruritic he wonders if there's anything we can do to help that. 01/02/17 I reviewed patient's wound culture which was obtained his visit last week. He was placed on doxycycline at that point. Unfortunately that does not appear to be an antibiotic that would likely help with the situation however the pseudomonas noted on culture is sensitive to Cipro. Also unfortunately patient's wound seems to have a large compared to last week's evaluation. Not severely so but there are definitely increased measurements in general. He is continuing to have discomfort as well he writes this to be a seven out of 10. In fact he would prefer me not to perform any debridement today due to the fact that he is having discomfort and considering he has an active infection on the little reluctant to do so anyway. No fevers, chills, nausea, or vomiting noted at this time. 01/08/17; patient seems dermatology on September 5. I suspect dermatology will want the slides from the biopsy I did sent to their pathologist. I'm not sure if there is a way we can expedite that. In any case the culture I did before I left on vacation 3 weeks ago showed Pseudomonas he was given 10 days of Cipro and per her description of her intake nurses is actually somewhat better this week although the wound is quite a bit bigger than I remember the last time I saw this. He still has 3 more days of Cipro [name], [name] ([id number]) 01/21/17; dermatology appointment tomorrow. He  has completed the ciprofloxacin for Pseudomonas. Surface of the wound looks better however he is had some deterioration in the lesions on his right leg. Meantime the left lateral leg wound we will continue with sample 01/29/17; patient had his dermatology appointment but I can't yet see that note. He is completed his antibiotics. The wound is more superficial but considerably larger in circumferential area than when he came in. This is in his left lateral calf. He also has swollen erythematous areas with superficial wounds on the right leg and small papular areas on both arms. There apparently areas in her his upper thighs and buttocks I did not look at those. Dermatology biopsied the right leg. Hopefully will have their input next week. 02/05/17; patient went back to see his dermatologist who told him that he had a "scratching problem" as well as staph. He is now on a 30 day course of doxycycline and I believe she gave him triamcinolone cream to the right leg areas to help with the itching [not exactly sure but probably triamcinolone]. She apparently looked at the left lateral leg wound although this was not rebiopsied and I think felt to be ultimately part of the same pathogenesis. He is using sample border foam and changing nevus himself. He now has a new open area on the right posterior leg which was his biopsy site I don't  have any of the dermatology notes 02/12/17; we put the patient in compression last week with SANTYL to the wound on the left leg and the biopsy. Edema is much better and the depth of the wound is now at level of skin. Area is still the same oBiopsy site on the right lateral leg we've also been using santyl with a border foam dressing and he is changing this himself. 02/19/17; Using silver alginate started last week to both the substantial left leg wound and the biopsy site on the right wound. He is tolerating compression well. Has a an appointment with his primary M.D. tomorrow  wondering about diuretics although I'm wondering if the edema problem is actually lymphedema 02/26/17; the patient has been to see his primary doctor Dr. Jerrel Ivory at [facility] our primary care. She started him on Lasix 20 mg and this seems to have helped with the edema. However we are not making substantial change with the left lateral calf wound and inflammation. The biopsy site on the right leg also looks stable but not really all that different. 03/12/17; the patient has been to see vein and vascular Dr. Lucky Cowboy. He has had venous reflux studies I have not reviewed these. I did get a call from his dermatology office. 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 They therefore recommended clobetasol and tetracycline i.e. 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 03/26/17; apparently the patient just had reflux studies not an appointment with Dr. dew. She arrives in clinic today having applied clobetasol for 2-3 weeks. He notes over the last 2-3 days excessive drainage having to change the dressing 3-4 times a day and also expanding erythema. 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 Lillia Carmel at [facility] dermatology locally who kindly saw him at my request. 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 My concern was the degree of erythema medially extending from his left leg wound which was either inflammation from  pyoderma or cellulitis. I put him on Augmentin however culture of the wound showed Pseudomonas which is quinolone sensitive. I really don't believe he has cellulitis however in view of everything I will continue and give him a course of Cipro. He is also on doxycycline as an immune modulator for the pyoderma. In addition to his original wound on the left lateral leg with surrounding erythema he has a wound on the right posterior calf which was an original biopsy site done by dermatology. 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 Saw Dr. Lillia Carmel yesterday. He has been using topical antibiotics to both wound areas his original wound on the left and the biopsies/pathergy area on the right. There is definitely some improvement in the inflammation around the wound on the right although the patient states he has increasing sensitivity of the wounds. He is on prednisone 60 and doxycycline 1 as prescribed by Dr. Lillia Carmel. He is covering the topical  antibiotic with gauze and putting this in his own compression stocks and changing this daily. He states that Dr. Lottie Rater did a culture of the left leg wound yesterday 05/07/17; pyoderma gangrenosum. The patient saw Dr. Lillia Carmel yesterday and has a follow-up with her in one month. He is still using topical antibiotics to both wounds although he can't recall exactly what type. He is still on prednisone 60 mg. Dr. Lillia Carmel stated that the doxycycline could stop if we were in agreement. 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 The right medial leg was induced by biopsy/pathergy. The area on the right is essentially healed. 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 The right medial leg has closed and  remains closed. He is still on prednisone 60. oHe tells me he missed his last dermatology appointment with Dr. Lillia Carmel but will make another appointment. He reports that her blood sugar at a recent screen in [place] was high 200's. He was 180 today. He is more cushingoid blood pressure is [name], [name] ([id number]) up a bit. I think he is going to require still much longer prednisone perhaps another 3 months before attempting to taper. In the meantime his wound is a lot better. Smaller. He is cleaning this off daily and applying topical antibiotics. When he was last in the clinic I thought about changing to [facility] and actually put in a couple of calls to dermatology although probably not during their business hours. 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 He continues on oral prednisone. He has been using triple antibiotic ointment. There is surface debris and we will transition to [facility] and have him return in 2 weeks. He has lost 30 pounds since his last appointment with lifestyle modification. He may benefit from topical steroid cream for treatment this can be considered at a later date. 08/22/17 on evaluation today patient appears to actually be doing rather well in regard to his left lateral lower extremity ulcer. He has actually been managed by Dr. Dellia Nims most recently. Patient is currently on oral steroids at this time. This seems to have been of benefit for him. Nonetheless his last visit was actually with Leah on 08/06/17. Currently he is not utilizing any topical steroid creams although this could be of benefit as well. No fevers, chills, nausea, or vomiting noted at this time. 09/05/17 on evaluation today patient appears to be doing better in regard to his left lateral lower extremity ulcer. He has been tolerating the dressing changes without complication.  He is using Santyl with good effect. Overall I'm very pleased with how things are standing at this point. Patient likewise is happy that this is doing better. 09/19/17 on evaluation today patient actually appears to be doing rather well in regard to his left lateral lower extremity ulcer. 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 He's not having any significant pain. 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 He has been tolerating the Santyl without complication and in general I feel like he's making good  progress. 10/17/17 on evaluation today patient appears to be doing very well in regard to his left lateral lower surety ulcer. He has been tolerating the dressing changes without complication. There does not appear to be any evidence of infection he's alternating the Santyl and the triple antibiotic ointment every other day this seems to be doing well for him. 11/03/17 on evaluation today patient appears to be doing very well in regard to his left lateral lower extremity ulcer. He is been tolerating the dressing changes without complication which is good news. Fortunately there does not appear to be any evidence of infection which is also great news. Overall is doing excellent they are starting to taper down on the prednisone is down to 40 mg at this point it also started topical clobetasol for him. 11/17/17 on evaluation today patient appears to be doing well in regard to his left lateral lower surety ulcer. He's been tolerating the dressing changes without complication. He does note that he is having no pain, no excessive drainage or discharge, and overall he feels like things are going about how he would expect and hope they would. Overall he seems to have no evidence of infection at this time in my opinion which is  good news. 12/04/17-He is seen in follow-up evaluation for right lateral lower extremity ulcer. He has been applying topical steroid cream. Today's measurement show slight increase in size. Over the next 2 weeks we will transition to every other day Santyl and steroid cream. He has been encouraged to monitor for changes and notify clinic with any concerns 12/15/17 on evaluation today patient's left lateral motion the ulcer and fortunately is doing worse again at this point. This just since last week to this week has close to doubled in size according to the patient. I did not seeing last week's I do not have a visual to compare this to in our system was also down so we do not have all the charts and at this point. Nonetheless it does have me somewhat concerned in regard to the fact that again he was worried enough about it he has contact the dermatology that placed them back on the full strength, 50 mg a day of the prednisone that he was taken previous. He continues to alternate using clobetasol along with Santyl at this point. He is obviously somewhat frustrated. 12/22/17 on evaluation today patient appears to be doing a little worse compared to last evaluation. Unfortunately the wound is a little deeper and slightly larger than the last week's evaluation. With that being said he has made some progress in regard to the irritation surrounding at this time unfortunately despite that progress that's been made he still has a significant issue going on here. 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 For that reason I discussed with him today the possibility of perform a wound culture to ensure there's nothing overtly infected. 01/06/18 on evaluation today patient's wound is larger and deeper than previously evaluated. With that being said it did appear that  his wound was infected after my last evaluation with him. Subsequently I did end up prescribing a prescription for Bactrim DS which she has been taking and having no complication with. Fortunately there does not appear to be any evidence of Cardin, Gurkirat ([id number]) infection at this point in time as far as anything spreading, no want to  touch, and overall I feel like things are showing signs of improvement. 01/13/18 on evaluation today patient appears to be even a little larger and deeper than last time. There still muscle exposed in the base of the wound. Nonetheless he does appear to be less erythematous I do believe inflammation is calming down also believe the infection looks like it's probably resolved at this time based on what I'm seeing. No fevers, chills, nausea, or vomiting noted at this time. 01/30/18 on evaluation today patient actually appears to visually look better for the most part. Unfortunately those visually this looks better he does seem to potentially have what may be an abscess in the muscle that has been noted in the central portion of the wound. This is the first time that I have noted what appears to be fluctuance in the central portion of the muscle. With that being said I'm somewhat more concerned about the fact that this might indicate an abscess formation at this location. I do believe that an ultrasound would be appropriate. This is likely something we need to try to do as soon as possible. He has been switch to mupirocin ointment and he is no longer using the steroid ointment as prescribed by dermatology he sees them again next week he's been decreased from 60 to 40 mg of prednisone. 03/09/18 on evaluation today patient actually appears to be doing a little better compared to last time I saw him. There's not as much erythema surrounding the wound itself. He I did review his most recent infectious disease note which was dated 02/24/18. He saw Dr. Michel Bickers in  [place]. 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 Patient is now off of antibiotics and they are continually observing this. There seems to be no change in the past two weeks in my pinion based on what the patient says and what I see today compared to what Dr. Megan Salon likely saw two weeks ago. No fevers, chills, nausea, or vomiting noted at this time. 03/23/18 on evaluation today patient's wound actually appears to be showing signs of improvement which is good news. He is currently still on the Dapsone. He is also working on tapering the prednisone to get off of this and Dr. Lottie Rater is working with him in this regard. Nonetheless overall I feel like the wound is doing well it does appear based on the infectious disease note that I reviewed from Dr. 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 I did also review the results of his ultrasound of left lower extremity which revealed there was a dentist tissue in the base of the wound without an abscess noted. 04/06/18 on evaluation today the patient's left lateral lower extremity ulcer actually appears to be doing fairly well which is excellent news. There does not appear to be any evidence of infection at this time which is also great news. Overall he still does have a significantly large ulceration although little by little he seems to be making progress. He is down to 10 mg a day of the prednisone. 04/20/18 on evaluation today patient actually appears to be doing excellent at this time in regard to his left lower extremity ulcer. He's making signs of good progress unfortunately this is taking much longer than we would really like  to see but nonetheless he is making progress. Fortunately there does not appear to be any evidence of infection at this  time. No fevers, chills, nausea, or vomiting noted at this time. The patient has not been using the Santyl due to the cost he hadn't got in this field yet. He's mainly been using the antibiotic ointment topically. Subsequently he also tells me that he really has not been scrubbing in the shower I think this would be helpful again as I told him it doesn't have to be anything too aggressive to even make it believe just enough to keep it free of some of the loose slough and biofilm on the wound surface. 05/11/18 on evaluation today patient's wound appears to be making slow but sure progress in regard to the left lateral lower extremity ulcer. He is been tolerating the dressing changes without complication. Fortunately there does not appear to be any evidence of infection at this time. He is still just using triple antibiotic ointment along with clobetasol occasionally over the area. He never got the Santyl and really does not seem to intend to in my pinion. 06/01/18 on evaluation today patient appears to be doing a little better in regard to his left lateral lower extremity ulcer. He states that overall he does not feel like he is doing as well with the Dapsone as he did with the prednisone. Nonetheless he sees his dermatologist later today and is gonna talk to them about the possibility of going back on the prednisone. Overall again I believe that the wound would be better if you would utilize Santyl but he really does not seem to be interested in going back to the [place] at this point. He has been using triple antibiotic ointment. 06/15/18 on evaluation today patient's wound actually appears to be doing about the same at this point. Fortunately there is no signs of infection at this time. He has made slight improvements although he continues to not really want to clean the wound bed at this point. He states that he just doesn't mess with it he doesn't want to cause any problems with everything else  he has going on. He has been on medication, antibiotics as prescribed by his dermatologist, for a staff infection of his lower extremities which is really drying out now and looking much better he tells me. Fortunately there is no sign of overall infection. [name], [name] ([id number]) 06/29/18 on evaluation today patient appears to be doing well in regard to his left lateral lower surety ulcer all things considering. Fortunately his staff infection seems to be greatly improved compared to previous. He has no signs of infection and this is drying up quite nicely. He is still the doxycycline for this is no longer on cental, Dapsone, or any of the other medications. His dermatologist has recommended possibility of an infusion but right now he does not want to proceed with that. 07/13/18 on evaluation today patient appears to be doing about the same in regard to his left lateral lower surety ulcer. Fortunately there's no signs of infection at this time which is great news. Unfortunately he still builds up a significant amount of Slough/biofilm of the surface of the wound he still is not really cleaning this as he should be appropriately. Again I'm able to easily with saline and gauze remove the majority of this on the surface which if you would do this at home would likely be a dramatic improvement for him as far as getting the area  to improve. Nonetheless overall I still feel like he is making progress is just very slow. I think Santyl will be of benefit for him as well. Still he has not gotten this as of this point. 07/27/18 on evaluation today patient actually appears to be doing little worse in regards of the erythema around the periwound region of the wound he also tells me that he's been having more drainage currently compared to what he was experiencing last time I saw him. He states not quite as bad as what he had because this was infected previously but nonetheless is still appears to be doing  poorly. Fortunately there is no evidence of systemic infection at this point. The patient tells me that he is not going to be able to afford the Santyl. He is still waiting to hear about the infusion therapy with his dermatologist. Apparently she wants an updated colonoscopy first. 08/10/18 on evaluation today patient appears to be doing better in regard to his left lateral lower extremity ulcer. Fortunately he is showing signs of improvement in this regard he's actually been approved for Remicade infusion's as well although this has not been scheduled as of yet. Fortunately there's no signs of active infection at this time in regard to the wound although he is having some issues with infection of the right lower extremity is been seen as dermatologist for this. Fortunately they are definitely still working with him trying to keep things under control. 09/07/18 on evaluation today patient is actually doing rather well in regard to his left lateral lower extremity ulcer. He notes these actually having some hair grow back on his extremity which is something he has not seen in years. He also tells me that the pain is really not giving them any trouble at this time which is also good news overall she is very pleased with the progress he's using a combination of the mupirocin along with the probate is all mixed. 09/21/18 on evaluation today patient actually appears to be doing fairly well all things considered in regard to his looks from the ulcer. He's been tolerating the dressing changes without complication. Fortunately there's no signs of active infection at this time which is good news he is still on all antibiotics or prevention of the staff infection. He has been on prednisone for time although he states it is gonna contact his dermatologist and see if she put them on a short course due to some irritation that he has going on currently. Fortunately there's no evidence of any overall worsening this is  going very slow I think cental would be something that would be helpful for him although he states that $50 for tube is quite expensive. He therefore is not willing to get that at this point. 10/06/18 on evaluation today patient actually appears to be doing decently well in regard to his left lateral leg ulcer. He's been tolerating the dressing changes without complication. Fortunately there's no signs of active infection at this time. Overall I'm actually rather pleased with the progress he's making although it's slow he doesn't show any signs of infection and he does seem to be making some improvement. I do believe that he may need a switch up and dressings to try to help this to heal more appropriately and quickly. 10/19/18 on evaluation today patient actually appears to be doing better in regard to his left lateral lower extremity ulcer. This is shown signs of having much less Slough buildup at this point due to  the fact he has been using the Santyl. Obviously this is very good news. The overall size of the wound is not dramatically smaller but again the appearance is. 11/02/18 on evaluation today patient actually appears to be doing quite well in regard to his lower [place] ulcer. A lot of the skin around the ulcer is actually somewhat irritating at this point this seems to be more due to the dressing causing irritation from the adhesive that anything else. Fortunately there is no signs of active infection at this time. 11/24/18 on evaluation today patient appears to be doing a little worse in regard to his overall appearance of his lower extremity ulcer. There's more erythema and warmth around the wound unfortunately. He is currently on doxycycline which he has been on for some time. With that being said I'm not sure that seems to be helping with what appears to possibly be an acute cellulitis with regard to his left lower extremity ulcer. No fevers, chills, nausea, or vomiting noted at this  time. 12/08/18 on evaluation today patient's wounds actually appears to be doing significantly better compared to his last evaluation. He has been using Santyl along with alternating tripling about appointment as well as the steroid cream seems to be doing Cerone, Brittin ([id number]) quite well and the wound is showing signs of improvement which is excellent news. Fortunately there's no evidence of infection and in fact his culture came back negative with only normal skin flora noted. 12/21/2018 upon evaluation today patient actually appears to be doing excellent with regard to his ulcer. This is actually the best that I have seen it since have been helping to take care of him. It is both smaller as well as less slough noted on the surface of the wound and seems to be showing signs of good improvement with new skin growing from the edges. He has been using just the triamcinolone he does wonder if he can get a refill of that ointment today. 01/04/2019 upon evaluation today patient actually appears to be doing well with regard to his left lateral lower extremity ulcer. With that being said it does not appear to be that he is doing quite as well as last time as far as progression is concerned. There does not appear to be any signs of infection or significant irritation which is good news. With that being said I do believe that he may benefit from switching to a collagen based dressing based on how clean The wound appears. 01/18/2019 on evaluation today patient actually appears to be doing well with regard to his wound on the left lower extremity. He is not made a lot of progress compared to where we were previous but nonetheless does seem to be doing okay at this time which is good news. There is no signs of active infection which is also good news. My only concern currently is I do wish we can get him into utilizing the collagen dressing his insurance would not pay for the supplies that we ordered although  it appears that he may be able to order this through his supply company that he typically utilizes. This is [employer]. Nonetheless he did try to order it during the office visit today and it appears this did go through. We will see if he can get that it is a different brand but nonetheless he has collagen and I do think will be beneficial. 02/01/2019 on evaluation today patient actually appears to be doing a little worse today in regard  to the overall size of his wounds. Fortunately there is no signs of active infection at this time. That is visually. Nonetheless when this is happened before it was due to infection. For that reason were somewhat concerned about that this time as well. Electronic Signature(s) Signed: 02/01/2019 8:51:17 AM By: Worthy Keeler PA-C Entered By: Worthy Keeler on 02/01/2019 08:51:16 [name], [name] ([id number]) -------------------------------------------------------------------------------- Physical Exam Details Patient Name: [name] Date of Service: 02/01/2019 8:15 AM Medical Record Number: [id number] Patient Account Number: [id number] Date of Birth/Sex: 28-Dec-1980 (38 y.o. M) Treating RN: Harold Barban Primary Care Provider: [name] Other Clinician: Referring Provider: [name] Treating Provider/Extender: Melburn Hake, Elena Davia Weeks in Treatment: 64 Constitutional Well-nourished and well-hydrated in no acute distress. Respiratory normal breathing without difficulty. clear to auscultation bilaterally. Cardiovascular regular rate and rhythm with normal S1, S2. Psychiatric this patient is able to make decisions and demonstrates good insight into disease process. Alert and Oriented x 3. pleasant and cooperative. Notes Patient's wound bed currently showed signs of really no significant slough buildup and there was no surrounding erythema indicative of infection. With that being said I did take a culture at this point today in order to evaluate for  any possibility of infection and subsequently depending on the results of the culture we will address things as we may need to if any bacteria are noted. With that being said I think that for the next week we could still use the plain collagen as I think that is not a bad option and again he is only use that for 4 days I do not think that is what made this larger over the past 2 weeks. It took him that long to get the dressing. Electronic Signature(s) Signed: 02/01/2019 8:52:22 AM By: Worthy Keeler PA-C Entered By: Worthy Keeler on 02/01/2019 08:52:21 [name], [name] ([id number]) -------------------------------------------------------------------------------- Physician Orders Details Patient Name: [name] Date of Service: 02/01/2019 8:15 AM Medical Record Number: [id number] Patient Account Number: [id number] Date of Birth/Sex: 1980/12/12 (38 y.o. 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 Anesthetic (add to Medication List) Wound #1 Left,Lateral Lower Leg o Topical Lidocaine 4% cream applied to wound bed prior to debridement (In Clinic Only). Primary Wound Dressing Wound #1 Left,Lateral Lower Leg o Collagen Secondary Dressing Wound #1 Left,Lateral Lower Leg o Boardered Foam Dressing Dressing Change Frequency Wound #1 Left,Lateral Lower Leg o Change dressing every other day. Follow-up Appointments Wound #1 Left,Lateral Lower Leg o Return Appointment in 1 week. Edema Control Wound #1 Left,Lateral Lower Leg o  Elevate legs to the level of the heart and pump ankles as often as possible Laboratory o Bacteria identified in Wound by Culture (MICRO) - Culture- Left lateral lower leg oooo LOINC Code: 3845-3 oooo Convenience Name: Wound culture routine [name], [name] ([id number]) Electronic Signature(s) Signed: 02/08/2019 4:46:18 PM By: Worthy Keeler PA-C Signed: 04/06/2019 3:59:36 PM By: Harold Barban Previous Signature: 02/01/2019 4:31:37 PM Version By: Harold Barban Previous Signature: 02/01/2019 5:45:18 PM Version By: Worthy Keeler PA-C Entered By: Harold Barban on 02/08/2019 13:56:32 Lizaola, Saket ([id number]) -------------------------------------------------------------------------------- Problem List Details Patient Name: [name] Date of Service: 02/01/2019  8:15 AM Medical Record Number: [id number] Patient Account Number: [id number] Date of Birth/Sex: 1981-01-09 (38 y.o. M) Treating RN: Harold Barban Primary Care Provider: [name] Other Clinician: Referring Provider: [name] Treating Provider/Extender: Melburn Hake, Dahna Hattabaugh Weeks in Treatment: 112 Active Problems ICD-10 Evaluated Encounter Code Description Active Date Today Diagnosis L97.222 Non-pressure chronic ulcer of left calf with fat layer exposed 12/04/2016 No Yes L88 Pyoderma gangrenosum 03/26/2017 No Yes I87.2 Venous insufficiency (chronic) (peripheral) 12/04/2016 No Yes Inactive Problems ICD-10 Code Description Active Date Inactive Date L97.213 Non-pressure chronic ulcer of right calf with necrosis of muscle 04/02/2017 04/02/2017 Resolved Problems Electronic Signature(s) Signed: 02/01/2019 8:21:33 AM By: Worthy Keeler PA-C Entered By: Worthy Keeler on 02/01/2019 08:21:33 [name] ([id number]) -------------------------------------------------------------------------------- Progress Note Details Patient Name: [name] Date of Service: 02/01/2019 8:15 AM Medical Record Number:  [id number] Patient Account Number: [id number] Date of Birth/Sex: 1980/12/06 (38 y.o. M) Treating RN: Harold Barban Primary Care Provider: [name] Other Clinician: Referring Provider: [name] Treating Provider/Extender: Melburn Hake, Vern Guerette Weeks in Treatment: 112 Subjective Chief Complaint Information obtained from Patient He is here in follow up evaluation for LLE pyoderma ulcer History of Present Illness (HPI) 12/04/16; 38 year old man who comes into the clinic today for review of a wound on the posterior left calf. He tells me that is been there for about a year. He is not a diabetic he does smoke half a pack per day. He was seen in the ER on 11/20/16 felt to have cellulitis around the wound and was given clindamycin. An x-ray did not show osteomyelitis. The patient initially tells me that he has a milk allergy that sets off a pruritic itching rash on his lower legs which she scratches incessantly and he thinks that's what may have set up the wound. He has been using various topical antibiotics and ointments without any effect. He works in a [employer] and is on his feet all day. He does not have a prior history of wounds however he does have the rash on both lower legs the right arm and the ventral aspect of his left arm. These are excoriations and clearly have had scratching however there are of macular looking areas on both legs including a substantial larger area on the right leg. This does not have an underlying open area. There is no blistering. The patient tells me that 2 years ago in [place] in response to the rash on his legs he saw a dermatologist who told him he had a condition which may be pyoderma gangrenosum although I may be putting words into his mouth. He seemed to recognize this. 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 He is not in any treatment for this. He's had no recent travel 12/11/16; the patient arrives today  with his wound and roughly the same condition we've been using silver alginate this is a deep punched out wound with some surrounding erythema but no tenderness. Biopsy I did did not show confirmed pyoderma gangrenosum suggested nonspecific inflammation and vasculitis but does not provide an actual description of what was seen by the pathologist. I'm really not able to understand this We have also received information from the patient's dermatologist in [place] notes from April 2016. This was a doctor Agarwal- antal. 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 He was prescribed topical steroid high potency under occlusion which helped but at this point the patient did not have a deep punched out wound.  12/18/16; the patient's wound is larger in terms of surface area however this surface looks better and there is less depth. The surrounding erythema also is better. The patient states that the wrap we put on came off 2 days ago when he has been using his compression stockings. He we are in the process of getting a dermatology consult. 12/26/16 on evaluation today patient's left lower extremity wound shows evidence of infection with surrounding erythema noted. He has been tolerating the dressing changes but states that he has noted more discomfort. There is a larger area of erythema surrounding the wound. No fevers, chills, nausea, or vomiting noted at this time. With that being said the wound still does have slough covering the surface. He is not allergic to any medication that he is aware of at this point. In regard to his right lower extremity he had several regions that are erythematous and pruritic he wonders if there's anything we can do to help that. 01/02/17 I reviewed patient's wound culture which was obtained his visit last week. He was placed on doxycycline at that point. Unfortunately that does not appear to be an antibiotic that would likely help with the situation however the  pseudomonas noted on culture is sensitive to Cipro. Also unfortunately patient's wound seems to have a large compared to last week's evaluation. Not severely so but there are definitely increased measurements in general. He is continuing to have discomfort as well he writes this to be a seven out of 10. In fact he would prefer me not to perform any debridement today due to the fact that he is having discomfort and considering he has an active infection on the little reluctant to do so anyway. No fevers, [name], [name] ([id number]) chills, nausea, or vomiting noted at this time. 01/08/17; patient seems dermatology on September 5. I suspect dermatology will want the slides from the biopsy I did sent to their pathologist. I'm not sure if there is a way we can expedite that. In any case the culture I did before I left on vacation 3 weeks ago showed Pseudomonas he was given 10 days of Cipro and per her description of her intake nurses is actually somewhat better this week although the wound is quite a bit bigger than I remember the last time I saw this. He still has 3 more days of Cipro 01/21/17; dermatology appointment tomorrow. He has completed the ciprofloxacin for Pseudomonas. Surface of the wound looks better however he is had some deterioration in the lesions on his right leg. Meantime the left lateral leg wound we will continue with sample 01/29/17; patient had his dermatology appointment but I can't yet see that note. He is completed his antibiotics. The wound is more superficial but considerably larger in circumferential area than when he came in. This is in his left lateral calf. He also has swollen erythematous areas with superficial wounds on the right leg and small papular areas on both arms. There apparently areas in her his upper thighs and buttocks I did not look at those. Dermatology biopsied the right leg. Hopefully will have their input next week. 02/05/17; patient went back to see his  dermatologist who told him that he had a "scratching problem" as well as staph. He is now on a 30 day course of doxycycline and I believe she gave him triamcinolone cream to the right leg areas to help with the itching [not exactly sure but probably triamcinolone]. She apparently looked at the left lateral leg wound  although this was not rebiopsied and I think felt to be ultimately part of the same pathogenesis. He is using sample border foam and changing nevus himself. He now has a new open area on the right posterior leg which was his biopsy site I don't have any of the dermatology notes 02/12/17; we put the patient in compression last week with SANTYL to the wound on the left leg and the biopsy. Edema is much better and the depth of the wound is now at level of skin. Area is still the same Biopsy site on the right lateral leg we've also been using santyl with a border foam dressing and he is changing this himself. 02/19/17; Using silver alginate started last week to both the substantial left leg wound and the biopsy site on the right wound. He is tolerating compression well. Has a an appointment with his primary M.D. tomorrow wondering about diuretics although I'm wondering if the edema problem is actually lymphedema 02/26/17; the patient has been to see his primary doctor Dr. Jerrel Ivory at [facility] our primary care. She started him on Lasix 20 mg and this seems to have helped with the edema. However we are not making substantial change with the left lateral calf wound and inflammation. The biopsy site on the right leg also looks stable but not really all that different. 03/12/17; the patient has been to see vein and vascular Dr. Lucky Cowboy. He has had venous reflux studies I have not reviewed these. I did get a call from his dermatology office. They felt that he might have pathergy based on their biopsy on his right leg which led them to look at the slides of the biopsy I did on the left leg and they  wonder whether this represents pyoderma gangrenosum which was the original supposition in a man with ulcerative colitis albeit inactive for many years. They therefore recommended clobetasol and tetracycline i.e. 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 03/26/17; apparently the patient just had reflux studies not an appointment with Dr. dew. She arrives in clinic today having applied clobetasol for 2-3 weeks. He notes over the last 2-3 days excessive drainage having to change the dressing 3-4 times a day and also expanding erythema. 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 Lillia Carmel at [name] locally who kindly saw him at my request. 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 My concern was the degree of erythema medially extending from his left leg wound which was either inflammation from pyoderma or cellulitis. I put him on Augmentin however culture of the wound showed Pseudomonas which is quinolone sensitive. I really don't believe he has cellulitis however in view of everything I will continue and give him a course of Cipro. He is also on doxycycline as an immune modulator for the pyoderma. In addition to his original wound on the left lateral leg with surrounding erythema he has a wound on the right posterior calf which was an original biopsy site done by dermatology. 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 Saw Dr. Lillia Carmel yesterday. He has been using topical antibiotics to both wound areas his original wound on the left and the biopsies/pathergy area on  the right. There is definitely some  improvement in the inflammation around the wound on the right although the patient states he has increasing sensitivity of the wounds. He is on prednisone 60 and doxycycline 1 as prescribed by Dr. Lillia Carmel. He is covering the topical antibiotic with gauze and putting this in his own compression stocks and changing this daily. He states that Dr. Lottie Rater did a culture of the left leg wound yesterday 05/07/17; pyoderma gangrenosum. The patient saw Dr. Lillia Carmel yesterday and has a follow-up with her in one month. He is still using topical antibiotics to both wounds although he can't recall exactly what type. He is still on prednisone 60 mg. Dr. Lillia Carmel stated that the doxycycline could stop if we were in agreement. 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 The right medial leg was induced by [name] ([id number]) biopsy/pathergy. The area on the right is essentially healed. 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 The right medial leg has closed and remains closed. He is still on prednisone 60. He tells me he missed his last dermatology appointment with Dr. Lillia Carmel but will make another appointment. He reports that her blood sugar at a recent screen in [place] was high 200's. He was 180 today. 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 I think he is going to require still much longer prednisone perhaps another 3 months before attempting to taper. In the meantime his wound is a lot better. Smaller. He is cleaning this off daily and applying topical antibiotics. When he was last in the clinic I thought about changing to [facility] and actually put in a couple of calls to dermatology although probably not during their business hours. 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 He continues on oral prednisone. He has been using triple antibiotic ointment. There is surface debris and we will transition to [facility] and have him return in 2 weeks. He has lost 30 pounds since his last appointment with lifestyle modification. He may benefit from topical steroid cream for treatment this can be considered at a later date. 08/22/17 on evaluation today patient appears to actually be doing rather well in regard to his left lateral lower extremity ulcer. He has actually been managed by Dr. Dellia Nims most recently. Patient is currently on oral steroids at this time. This seems to have been of benefit for him. Nonetheless his last visit was actually with Leah on 08/06/17. Currently he is not utilizing any topical steroid creams although this could be of benefit as well. No fevers, chills, nausea, or vomiting noted at this time. 09/05/17 on evaluation today patient appears to be doing better in regard to his left lateral lower extremity ulcer. He has been tolerating the dressing changes without complication. He is using Santyl with good effect. Overall I'm very pleased with how things are standing at this point. Patient likewise is happy that this is doing better. 09/19/17 on evaluation today patient actually appears to be doing rather well in regard to his left lateral lower extremity ulcer. 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 He's not  having any significant pain. 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 He has been tolerating the Santyl without complication and in general I feel like he's making good progress. 10/17/17 on evaluation today patient appears to be doing very well in  regard to his left lateral lower surety ulcer. He has been tolerating the dressing changes without complication. There does not appear to be any evidence of infection he's alternating the Santyl and the triple antibiotic ointment every other day this seems to be doing well for him. 11/03/17 on evaluation today patient appears to be doing very well in regard to his left lateral lower extremity ulcer. He is been tolerating the dressing changes without complication which is good news. Fortunately there does not appear to be any evidence of infection which is also great news. Overall is doing excellent they are starting to taper down on the prednisone is down to 40 mg at this point it also started topical clobetasol for him. 11/17/17 on evaluation today patient appears to be doing well in regard to his left lateral lower surety ulcer. He's been tolerating the dressing changes without complication. He does note that he is having no pain, no excessive drainage or discharge, and overall he feels like things are going about how he would expect and hope they would. Overall he seems to have no evidence of infection at this time in my opinion which is good news. 12/04/17-He is seen in follow-up evaluation for right lateral lower extremity ulcer. He has been applying topical steroid cream. Today's measurement show slight increase in size. Over the next 2 weeks we will transition to every other day Santyl and steroid cream. He has been encouraged to monitor for changes and notify clinic with any concerns 12/15/17 on evaluation today patient's left lateral motion the ulcer and fortunately is doing worse again at this point. This just since last week to this week has close to doubled in size according to the patient. I did not seeing last week's I do not have a visual to compare this to in our system was also down so we do not have all the charts and at this point. Nonetheless it does have me somewhat concerned in  regard to the fact that again he was worried enough about it he has contact the dermatology that placed them back on the full strength, 50 mg a day of the prednisone that he was taken previous. He continues to alternate using clobetasol along with Santyl at this point. He is obviously somewhat frustrated. 12/22/17 on evaluation today patient appears to be doing a little worse compared to last evaluation. Unfortunately the wound is a little deeper and slightly larger than the last week's evaluation. With that being said he has made some progress in regard to the irritation surrounding at this time unfortunately despite that progress that's been made he still has a significant issue going on here. I'm not certain that he is having really any true infection at this time although with the Pyoderma gangrenosum it can [name], [name] ([id number]) sometimes be difficult to differentiate infection versus just inflammation. For that reason I discussed with him today the possibility of perform a wound culture to ensure there's nothing overtly infected. 01/06/18 on evaluation today patient's wound is larger and deeper than previously evaluated. With that being said it did appear that his wound was infected after my last evaluation with him.  Subsequently I did end up prescribing a prescription for Bactrim DS which she has been taking and having no complication with. Fortunately there does not appear to be any evidence of infection at this point in time as far as anything spreading, no want to touch, and overall I feel like things are showing signs of improvement. 01/13/18 on evaluation today patient appears to be even a little larger and deeper than last time. There still muscle exposed in the base of the wound. Nonetheless he does appear to be less erythematous I do believe inflammation is calming down also believe the infection looks like it's probably resolved at this time based on what I'm seeing. No fevers,  chills, nausea, or vomiting noted at this time. 01/30/18 on evaluation today patient actually appears to visually look better for the most part. Unfortunately those visually this looks better he does seem to potentially have what may be an abscess in the muscle that has been noted in the central portion of the wound. This is the first time that I have noted what appears to be fluctuance in the central portion of the muscle. With that being said I'm somewhat more concerned about the fact that this might indicate an abscess formation at this location. I do believe that an ultrasound would be appropriate. This is likely something we need to try to do as soon as possible. He has been switch to mupirocin ointment and he is no longer using the steroid ointment as prescribed by dermatology he sees them again next week he's been decreased from 60 to 40 mg of prednisone. 03/09/18 on evaluation today patient actually appears to be doing a little better compared to last time I saw him. There's not as much erythema surrounding the wound itself. He I did review his most recent infectious disease note which was dated 02/24/18. He saw Dr. Michel Bickers in [place]. 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 Patient is now off of antibiotics and they are continually observing this. There seems to be no change in the past two weeks in my pinion based on what the patient says and what I see today compared to what Dr. Megan Salon likely saw two weeks ago. No fevers, chills, nausea, or vomiting noted at this time. 03/23/18 on evaluation today patient's wound actually appears to be showing signs of improvement which is good news. He is currently still on the Dapsone. He is also working on tapering the prednisone to get off of this and Dr. Lottie Rater is working with him in this regard. Nonetheless overall I feel like the wound is doing  well it does appear based on the infectious disease note that I reviewed from Dr. 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 I did also review the results of his ultrasound of left lower extremity which revealed there was a dentist tissue in the base of the wound without an abscess noted. 04/06/18 on evaluation today the patient's left lateral lower extremity ulcer actually appears to be doing fairly well which is excellent news. There does not appear to be any evidence of infection at this time which is also great news. Overall he still does have a significantly large ulceration although little by little he seems to be making progress. He is down to  10 mg a day of the prednisone. 04/20/18 on evaluation today patient actually appears to be doing excellent at this time in regard to his left lower extremity ulcer. He's making signs of good progress unfortunately this is taking much longer than we would really like to see but nonetheless he is making progress. Fortunately there does not appear to be any evidence of infection at this time. No fevers, chills, nausea, or vomiting noted at this time. The patient has not been using the Santyl due to the cost he hadn't got in this field yet. He's mainly been using the antibiotic ointment topically. Subsequently he also tells me that he really has not been scrubbing in the shower I think this would be helpful again as I told him it doesn't have to be anything too aggressive to even make it believe just enough to keep it free of some of the loose slough and biofilm on the wound surface. 05/11/18 on evaluation today patient's wound appears to be making slow but sure progress in regard to the left lateral lower extremity ulcer. He is been tolerating the dressing changes without complication. Fortunately there does not appear to be any evidence of  infection at this time. He is still just using triple antibiotic ointment along with clobetasol occasionally over the area. He never got the Santyl and really does not seem to intend to in my pinion. 06/01/18 on evaluation today patient appears to be doing a little better in regard to his left lateral lower extremity ulcer. He states that overall he does not feel like he is doing as well with the Dapsone as he did with the prednisone. Nonetheless he sees his dermatologist later today and is gonna talk to them about the possibility of going back on the prednisone. Overall again I believe that the wound would be better if you would utilize Santyl but he really does not seem to be interested in going back to the [employer] at this point. He has been using triple antibiotic ointment. [name], [name] ([id number]) 06/15/18 on evaluation today patient's wound actually appears to be doing about the same at this point. Fortunately there is no signs of infection at this time. He has made slight improvements although he continues to not really want to clean the wound bed at this point. He states that he just doesn't mess with it he doesn't want to cause any problems with everything else he has going on. He has been on medication, antibiotics as prescribed by his dermatologist, for a staff infection of his lower extremities which is really drying out now and looking much better he tells me. Fortunately there is no sign of overall infection. 06/29/18 on evaluation today patient appears to be doing well in regard to his left lateral lower surety ulcer all things considering. Fortunately his staff infection seems to be greatly improved compared to previous. He has no signs of infection and this is drying up quite nicely. He is still the doxycycline for this is no longer on cental, Dapsone, or any of the other medications. His dermatologist has recommended possibility of an infusion but right now he does not want to proceed  with that. 07/13/18 on evaluation today patient appears to be doing about the same in regard to his left lateral lower surety ulcer. Fortunately there's no signs of infection at this time which is great news. Unfortunately he still builds up a significant amount of Slough/biofilm of the surface of the wound he still is not really  cleaning this as he should be appropriately. Again I'm able to easily with saline and gauze remove the majority of this on the surface which if you would do this at home would likely be a dramatic improvement for him as far as getting the area to improve. Nonetheless overall I still feel like he is making progress is just very slow. I think Santyl will be of benefit for him as well. Still he has not gotten this as of this point. 07/27/18 on evaluation today patient actually appears to be doing little worse in regards of the erythema around the periwound region of the wound he also tells me that he's been having more drainage currently compared to what he was experiencing last time I saw him. He states not quite as bad as what he had because this was infected previously but nonetheless is still appears to be doing poorly. Fortunately there is no evidence of systemic infection at this point. The patient tells me that he is not going to be able to afford the Santyl. He is still waiting to hear about the infusion therapy with his dermatologist. Apparently she wants an updated colonoscopy first. 08/10/18 on evaluation today patient appears to be doing better in regard to his left lateral lower extremity ulcer. Fortunately he is showing signs of improvement in this regard he's actually been approved for Remicade infusion's as well although this has not been scheduled as of yet. Fortunately there's no signs of active infection at this time in regard to the wound although he is having some issues with infection of the right lower extremity is been seen as dermatologist for this.  Fortunately they are definitely still working with him trying to keep things under control. 09/07/18 on evaluation today patient is actually doing rather well in regard to his left lateral lower extremity ulcer. He notes these actually having some hair grow back on his extremity which is something he has not seen in years. He also tells me that the pain is really not giving them any trouble at this time which is also good news overall she is very pleased with the progress he's using a combination of the mupirocin along with the probate is all mixed. 09/21/18 on evaluation today patient actually appears to be doing fairly well all things considered in regard to his looks from the ulcer. He's been tolerating the dressing changes without complication. Fortunately there's no signs of active infection at this time which is good news he is still on all antibiotics or prevention of the staff infection. He has been on prednisone for time although he states it is gonna contact his dermatologist and see if she put them on a short course due to some irritation that he has going on currently. Fortunately there's no evidence of any overall worsening this is going very slow I think cental would be something that would be helpful for him although he states that $50 for tube is quite expensive. He therefore is not willing to get that at this point. 10/06/18 on evaluation today patient actually appears to be doing decently well in regard to his left lateral leg ulcer. He's been tolerating the dressing changes without complication. Fortunately there's no signs of active infection at this time. Overall I'm actually rather pleased with the progress he's making although it's slow he doesn't show any signs of infection and he does seem to be making some improvement. I do believe that he may need a switch up and dressings to  try to help this to heal more appropriately and quickly. 10/19/18 on evaluation today patient actually  appears to be doing better in regard to his left lateral lower extremity ulcer. This is shown signs of having much less Slough buildup at this point due to the fact he has been using the [employer]. Obviously this is very good news. The overall size of the wound is not dramatically smaller but again the appearance is. 11/02/18 on evaluation today patient actually appears to be doing quite well in regard to his lower [place] ulcer. A lot of the skin around the ulcer is actually somewhat irritating at this point this seems to be more due to the dressing causing irritation from the adhesive that anything else. Fortunately there is no signs of active infection at this time. 11/24/18 on evaluation today patient appears to be doing a little worse in regard to his overall appearance of his lower extremity [name], [name] ([id number]) ulcer. There's more erythema and warmth around the wound unfortunately. He is currently on doxycycline which he has been on for some time. With that being said I'm not sure that seems to be helping with what appears to possibly be an acute cellulitis with regard to his left lower extremity ulcer. No fevers, chills, nausea, or vomiting noted at this time. 12/08/18 on evaluation today patient's wounds actually appears to be doing significantly better compared to his last evaluation. He has been using Santyl along with alternating tripling about appointment as well as the steroid cream seems to be doing quite well and the wound is showing signs of improvement which is excellent news. Fortunately there's no evidence of infection and in fact his culture came back negative with only normal skin flora noted. 12/21/2018 upon evaluation today patient actually appears to be doing excellent with regard to his ulcer. This is actually the best that I have seen it since have been helping to take care of him. It is both smaller as well as less slough noted on the surface of the wound and seems to be  showing signs of good improvement with new skin growing from the edges. He has been using just the triamcinolone he does wonder if he can get a refill of that ointment today. 01/04/2019 upon evaluation today patient actually appears to be doing well with regard to his left lateral lower extremity ulcer. With that being said it does not appear to be that he is doing quite as well as last time as far as progression is concerned. There does not appear to be any signs of infection or significant irritation which is good news. With that being said I do believe that he may benefit from switching to a collagen based dressing based on how clean The wound appears. 01/18/2019 on evaluation today patient actually appears to be doing well with regard to his wound on the left lower extremity. He is not made a lot of progress compared to where we were previous but nonetheless does seem to be doing okay at this time which is good news. There is no signs of active infection which is also good news. My only concern currently is I do wish we can get him into utilizing the collagen dressing his insurance would not pay for the supplies that we ordered although it appears that he may be able to order this through his supply company that he typically utilizes. This is [employer]. Nonetheless he did try to order it during the office visit today and it appears  this did go through. We will see if he can get that it is a different brand but nonetheless he has collagen and I do think will be beneficial. 02/01/2019 on evaluation today patient actually appears to be doing a little worse today in regard to the overall size of his wounds. Fortunately there is no signs of active infection at this time. That is visually. Nonetheless when this is happened before it was due to infection. For that reason were somewhat concerned about that this time as well. Patient History Information obtained from Patient. Family History Diabetes -  Mother,Maternal Grandparents, Heart Disease - Mother,Maternal Grandparents, Hereditary Spherocytosis - Siblings, Hypertension - Maternal Grandparents, Stroke - Siblings, No family history of Cancer, Kidney Disease, Lung Disease, Seizures, Thyroid Problems, Tuberculosis. Social History Current some day smoker, Marital Status - Married, Alcohol Use - Rarely, Drug Use - No History, Caffeine Use - Moderate. Medical History Eyes Denies history of Cataracts, Glaucoma, Optic Neuritis Ear/Nose/Mouth/Throat Denies history of Chronic sinus problems/congestion, Middle ear problems Hematologic/Lymphatic Denies history of Anemia, Hemophilia, Human Immunodeficiency Virus, Lymphedema, Sickle Cell Disease Respiratory Patient has history of Sleep Apnea - cpap Denies history of Aspiration, Asthma, Chronic Obstructive Pulmonary Disease (COPD), Pneumothorax Cardiovascular Patient has history of Hypertension Denies history of Angina, Arrhythmia, Congestive Heart Failure, Coronary Artery Disease, Deep Vein Thrombosis, Hypotension, Myocardial Infarction, Peripheral Arterial Disease, Peripheral Venous Disease, Phlebitis, Vasculitis Gastrointestinal Patient has history of Colitis Endocrine [name], [name] ([id number]) Denies history of Type I Diabetes, Type II Diabetes Genitourinary Denies history of End Stage Renal Disease Immunological Denies history of Lupus Erythematosus, Raynaud s, Scleroderma Integumentary (Skin) Denies history of History of Burn, History of pressure wounds Musculoskeletal Denies history of Gout, Rheumatoid Arthritis, Osteoarthritis, Osteomyelitis Neurologic Denies history of Dementia, Neuropathy, Quadriplegia, Paraplegia, Seizure Disorder Oncologic Denies history of Received Chemotherapy, Received Radiation Psychiatric Denies history of Anorexia/bulimia, Confinement Anxiety Review of Systems (ROS) Constitutional Symptoms (General Health) Denies complaints or symptoms of Fatigue,  Fever, Chills, Marked Weight Change. Respiratory Denies complaints or symptoms of Chronic or frequent coughs, Shortness of Breath. Cardiovascular Complains or has symptoms of LE edema. Denies complaints or symptoms of Chest pain. Psychiatric Denies complaints or symptoms of Anxiety, Claustrophobia. Objective Constitutional Well-nourished and well-hydrated in no acute distress. Vitals Time Taken: 8:18 AM, Height: 71 in, Weight: 338 lbs, BMI: 47.1, Temperature: 99.0 F, Pulse: 77 bpm, Respiratory Rate: 18 breaths/min, Blood Pressure: 133/81 mmHg. Respiratory normal breathing without difficulty. clear to auscultation bilaterally. Cardiovascular regular rate and rhythm with normal S1, S2. Psychiatric this patient is able to make decisions and demonstrates good insight into disease process. Alert and Oriented x 3. pleasant and cooperative. General Notes: Patient's wound bed currently showed signs of really no significant slough buildup and there was no surrounding erythema indicative of infection. With that being said I did take a culture at this point today in order to evaluate for any possibility of infection and subsequently depending on the results of the culture we will address things as we may need to if any bacteria are noted. With that being said I think that for the next week we could still use the plain collagen as I think that [facility] [place] ([id number]) is not a bad option and again he is only use that for 4 days I do not think that is what made this larger over the past 2 weeks. It took him that long to get the dressing. Integumentary (Hair, Skin) Wound #1 status is Open. Original cause of wound was Gradually Appeared.  The wound is located on the Left,Lateral Lower Leg. The wound measures 3.3cm length x 2.4cm width x 0.3cm depth; 6.22cm^2 area and 1.866cm^3 volume. There is Fat Layer (Subcutaneous Tissue) Exposed exposed. There is no tunneling or undermining noted. There is a  medium amount of serous drainage noted. The wound margin is distinct with the outline attached to the wound base. There is small (1-33%) red granulation within the wound bed. There is a large (67-100%) amount of necrotic tissue within the wound bed including Adherent Slough. 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 Anesthetic (add to Medication List): Wound #1 Left,Lateral Lower Leg: Topical Lidocaine 4% cream applied to wound bed prior to debridement (In Clinic Only). Primary Wound Dressing: Wound #1 Left,Lateral Lower Leg: Collagen Secondary Dressing: Wound #1 Left,Lateral Lower Leg: Boardered Foam Dressing Dressing Change Frequency: Wound #1 Left,Lateral Lower Leg: Change dressing every other day. Follow-up Appointments: Wound #1 Left,Lateral Lower Leg: Return Appointment in 1 week. Edema Control: Wound #1 Left,Lateral Lower Leg: Elevate legs to the level of the heart and pump ankles as often as possible 1. I would recommend currently we continue with a plain collagen dressing as he really has not had enough time using this on a regular basis to ensure that it is doing the job appropriately. 2. I am going to suggest as well that we obtain a wound culture which was done today and send this for evaluation if it shows Morrisette, Aneesh ([id number]) anything that needs to be addressed we will do so currently he is on doxycycline chronically for infection prevention. 3. I do recommend that the patient elevate his leg as often as possible I think this can be very important and helpful as well. We will see patient back for reevaluation in 1 week here in the clinic. If anything worsens or changes patient will contact our office for additional recommendations. Electronic  Signature(s) Signed: 02/01/2019 8:53:14 AM By: Worthy Keeler PA-C Entered By: Worthy Keeler on 02/01/2019 08:53:14 KENDAL, RAFFO ([id number]) -------------------------------------------------------------------------------- ROS/PFSH Details Patient Name: [name] Date of Service: 02/01/2019 8:15 AM Medical Record Number: [id number] Patient Account Number: [id number] Date of Birth/Sex: August 26, 1980 (38 y.o. M) Treating RN: Harold Barban Primary Care Provider: [name] Other Clinician: Referring Provider: [name] Treating Provider/Extender: Melburn Hake, Cherysh Epperly Weeks in Treatment: 112 Information Obtained From Patient Constitutional Symptoms (General Health) Complaints and Symptoms: Negative for: Fatigue; Fever; Chills; Marked Weight Change Respiratory Complaints and Symptoms: Negative for: Chronic or frequent coughs; Shortness of Breath Medical History: Positive for: Sleep Apnea - cpap Negative for: Aspiration; Asthma; Chronic Obstructive Pulmonary Disease (COPD); Pneumothorax Cardiovascular Complaints and Symptoms: Positive for: LE edema Negative for: Chest pain Medical History: Positive for: Hypertension Negative for: Angina; Arrhythmia; Congestive Heart Failure; Coronary Artery Disease; Deep Vein Thrombosis; Hypotension; Myocardial Infarction; Peripheral Arterial Disease; Peripheral Venous Disease; Phlebitis; Vasculitis Psychiatric Complaints and Symptoms: Negative for: Anxiety; Claustrophobia Medical History: Negative for: Anorexia/bulimia; Confinement Anxiety Eyes Medical History: Negative for: Cataracts; Glaucoma; Optic Neuritis Ear/Nose/Mouth/Throat Medical History: Negative for: Chronic sinus problems/congestion; Middle ear problems Hematologic/Lymphatic Medical History: Negative for: Anemia; Hemophilia; Human Immunodeficiency Virus; Lymphedema; Sickle Cell Disease Albritton, Naftula ([id number]) Gastrointestinal Medical History: Positive for:  Colitis Endocrine Medical History: Negative for: Type I Diabetes; Type II Diabetes Genitourinary Medical History: Negative for: End Stage Renal Disease Immunological Medical History: Negative for: Lupus Erythematosus; Raynaudos;  Scleroderma Integumentary (Skin) Medical History: Negative for: History of Burn; History of pressure wounds Musculoskeletal Medical History: Negative for: Gout; Rheumatoid Arthritis; Osteoarthritis; Osteomyelitis Neurologic Medical History: Negative for: Dementia; Neuropathy; Quadriplegia; Paraplegia; Seizure Disorder Oncologic Medical History: Negative for: Received Chemotherapy; Received Radiation Immunizations Pneumococcal Vaccine: Received Pneumococcal Vaccination: No Implantable Devices No devices added Family and Social History Cancer: No; Diabetes: Yes - Mother,Maternal Grandparents; Heart Disease: Yes - Mother,Maternal Grandparents; Hereditary Spherocytosis: Yes - Siblings; Hypertension: Yes - Maternal Grandparents; Kidney Disease: No; Lung Disease: No; Seizures: No; Stroke: Yes - Siblings; Thyroid Problems: No; Tuberculosis: No; Current some day smoker; Marital Status - Married; Alcohol Use: Rarely; Drug Use: No History; Caffeine Use: Moderate; Financial Concerns: No; Food, Clothing or Shelter Needs: No; Support System Lacking: No; Transportation Concerns: No Physician Affirmation I have reviewed and agree with the above information. Electronic Signature(s) TRAPPER, MEECH ([id number]) Signed: 02/01/2019 4:31:37 PM By: Harold Barban Signed: 02/01/2019 5:45:18 PM By: Worthy Keeler PA-C Entered By: Worthy Keeler on 02/01/2019 08:51:41 KEAUNDRE, THELIN ([id number]) -------------------------------------------------------------------------------- SuperBill Details Patient Name: [name] Date of Service: 02/01/2019 Medical Record Number: [id number] Patient Account Number: [id number] Date of Birth/Sex: April 17, 1981 (38 y.o. M) Treating RN: Harold Barban Primary Care Provider: [name] Other Clinician: Referring Provider: [name] Treating Provider/Extender: Melburn Hake, Shearon Clonch Weeks in Treatment: 112 Diagnosis Coding ICD-10 Codes Code Description [phone] Non-pressure chronic ulcer of left calf with fat layer exposed L88 Pyoderma gangrenosum I87.2 Venous insufficiency (chronic) (peripheral) Facility Procedures CPT4 Code: 42395320 Description: 23343 - WOUND CARE VISIT-LEV 2 EST PT Modifier: Quantity: 1 Physician Procedures CPT4 Code: [id number] Description: 37290 - WC PHYS LEVEL 4 - EST PT ICD-10 Diagnosis Description L97.222 Non-pressure chronic ulcer of left calf with fat layer expo L88 Pyoderma gangrenosum I87.2 Venous insufficiency (chronic) (peripheral) Modifier: sed Quantity: 1 Electronic Signature(s) Signed: 02/01/2019 8:53:29 AM By: Worthy Keeler PA-C Entered By: Worthy Keeler on 02/01/2019 08:53:29

## 2019-02-02 NOTE — Unmapped (Signed)
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 Patient has a tendency to pick at certain areas (evidenced by lichenification) which exacerbates the process. Primary lesion may be initial papules/pustules of PG but over time he has also demonstrated some itching related to venous stasis dermatitis and possible folliculitis.  His lesions are likely multifactorial related to all of these issues..  - Discussed risk of developing new PG lesions in areas of trauma   - Discussed attempting to decrease to 100 mg daily of doxycycline given that he has decent control vs intermittent pulses. Has been on tetracyclines for many years at this point.  - ILK as below for itching and potential PG lesions  -Recommend starting humira 80 mg every other week given persistence for many years despite multiple treatments as below. Rx sent today.  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 LL ulcer healing but concern for superficial pyoderma vs traumatic superinfected crusted erosions to R leg. Smoking hx and obesity likely contributing. Hx of UC also likely related. Currently well-controlled  - Currently off of long-term high-dose prednisone use that was complicated by development of cushingoid features and development of diabetes mellitus; stable while currently on dapsone  - Humira and Remicade previously ordered and denied by insurance  - Continue follow-up with wound care  - Continue wearing compression stockings  -cont hibiclens  -cont hydrocortisone and TAC cream per patient preference  -avoid clobetasol given that he develops cellulitis easily under his compression  -consider clinda/rifampin  -consider aspirin or pentoxyfilline  -consider dapsone gel  -cont regular wound care visits  -patient following with GI - colonoscopy currently being scheduled  - ILK today to R lower leg  (pt reports previous improvement with ILK in the past)  After the patient was informed of risks, benefits and side effects of intralesional steroid injection, the patient elected to undergo injection. Informed verbal consent was obtained. Risk of atrophy  and dyspigmentation with injection was explained. Kenalog 10 mg/ml was injected locally into the sites located R ant shin in a clean fashion following alcohol prep.   Total volume in ml=1.2.  Number of sites treated: 6  Wound care was explained to the patient     RTC: 1 month    SUBJECTIVE    Chief Complaint   Patient presents with   ??? Follow-up     leg uclers on left leg starting to grow this week with some itching and pain 2-3v       HPI:  This is a pleasant 38 y.o. male with a history of pyoderma gangrenosum and MRSA infection who presents today for f/up. Last seen by Sharene Butters a few weeks ago.Notes that he has had a lot of worsening of the ulcer on the L leg. Has difficulty with pain but also absence of feeling. Has some new areas on the R leg as well. Currently seeing wound care. Present for many years.     Disease history:  -Starting about 2013, he started to develop itchy bumps that enlarge when he scratches and picks at them. This initially affected just his lower legs, but more recently prior to initial presentation to our clinic, involvement spread to his thighs, buttocks, arms, and forearms.  -Areas start as  tiny bumps that he then scratches. 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   -In spring 2018, an ulcer started on the L lateral calf and gradually enlarged, but was slowly healing and shrinking by the time of presentation to our clinic. Denies any trauma at this site and notes that it started similar to other rashes on skin. Prior treatments include oral abx, including as recently as 12/2016 with courses of clinda and doxy, as well as topical steroids. He has h/o ulcerative colitis, now in remission without meds since 2015. Denies h/o liver or kidney disease or DM. Used to live in [place] when this started. Works for [employer] and is on feet all day, endorses swelling of legs. Denies new meds prior to onset.  - At his initial visit 01/22/2017, clobetasol ointment and doxycycline 100mg  bid x 4 weeks were started. A biopsy of a keratotic pruritic plaque was obtained. 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 Tissue culture grew 4+ MSSA. 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   - w/up for underlying pruritus (given patient report and difficulty not picking at the involved areas) on 10/17 revealed unremarkable HIV, hepatitis panel, LDH, TSH, free T4 . Outside slides from previous bx were reviewed by Dr. Eyvonne Left and showed venous stasis changes as well as a neutrophilic infiltrate that could be consistent with PG. He was started on topical clobetasol BID. He was continued on PO doxycycline for additional 6 weeks. His left leg ulcer continued to grow and the site of his previous biopsy on the posterior right leg has also developed a large non healing ulcer.   -10/18: w/up for underlying etiology of PG negative on 10/18: negative spep/upep, cbc w/diff consistently normal, and reports a known hx of ulcerative colitis which is in remssion  - Repeat biopsy as below done on 03/26/17 primarily c/w PG vs infectious with one potential organism on Fite stain but unclear if related.   - Patient started on prednisone on 04/01/17 with almost resolution by 7/19.  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 Stopped doxepin and gabapentin given lack of improvement      Pertinent PMH:   Ulcerative colitis, managed by [facility]  Join pain managed by [facility]  HTN  DM   Denies h/o liver, kidney abnormalities     ROS: A balance of 10 systems was reviewed and negative.     PE:  General: Well-developed, well-nourished male in no acute distress, resting comfortably.  Neuro: Alert and oriented, answers questions appropriately.  Skin: Examination of face, neck, bilateral upper extremities, bilateral lower extremities was notable for:  - Right lower leg with lichenified plaques and papules and excoriations  L leg with ulcer w/o surrounding violaceous color  -All other areas examined were normal or had no significant findings.

## 2019-02-02 NOTE — Progress Notes (Signed)
[name] ([id number]) Visit Report for 02/01/2019 Arrival Information Details Patient Name: [name], [name] Date of Service: 02/01/2019 8:15 AM Medical Record Number: [id number] Patient Account Number: [id number] Date of Birth/Sex: 06-Apr-1981 (38 y.o. M) Treating RN: [name] Primary Care [name]: [name] Other Clinician: Referring [name]: [name] Treating [name]: [name] Weeks in Treatment: 32 Visit Information History Since Last Visit Added or deleted any medications: No Patient Arrived: Ambulatory Any new allergies or adverse reactions: No Arrival Time: 08:18 Had a fall or experienced change in No Accompanied By: self activities of daily living that may affect Transfer Assistance: None risk of falls: Patient Identification Verified: Yes Signs or symptoms of abuse/neglect since last visito No Secondary Verification Process Completed: Yes Hospitalized since last visit: No Patient Requires Transmission-Based No Implantable device outside of the clinic excluding No Precautions: cellular tissue based products placed in the center Patient Has Alerts: No since last visit: Has Dressing in Place as Prescribed: Yes Pain Present Now: Yes Electronic Signature(s) Signed: 02/01/2019 11:28:54 AM By: [name] Entered By: [name] Bears on 02/01/2019 08:19:31 [name] ([id number]) -------------------------------------------------------------------------------- Clinic Level of Care Assessment Details Patient Name: [name] Date of Service: 02/01/2019 8:15 AM Medical Record Number: [id number] Patient Account Number: [id number] Date of Birth/Sex: Jul 25, 1980 (38 y.o. M) Treating RN: [name] Primary Care Anabella Capshaw: [name] Other Clinician: Referring [name]: [name] Treating [name]: [name] Weeks in Treatment: 112 Clinic Level of Care Assessment  Items TOOL 1 Quantity Score []  - Use when EandM and Procedure is performed on INITIAL visit 0 ASSESSMENTS - Nursing Assessment / Reassessment X - General Physical Exam (combine w/ comprehensive assessment (listed just below) when 1 20 performed on new pt. evals) X- 1 25 Comprehensive Assessment (HX, ROS, Risk Assessments, Wounds Hx, etc.) ASSESSMENTS - Wound and Skin Assessment / Reassessment []  - Dermatologic / Skin Assessment (not related to wound area) 0 ASSESSMENTS - Ostomy and/or Continence Assessment and Care []  - Incontinence Assessment and Management 0 []  - 0 Ostomy Care Assessment and Management (repouching, etc.) PROCESS - Coordination of Care X - Simple Patient / Family Education for ongoing care 1 15 []  - 0 Complex (extensive) Patient / Family Education for ongoing care []  - 0 Staff obtains Programmer, systems, Records, Test Results / Process Orders []  - 0 Staff telephones HHA, Nursing Homes / Clarify orders / etc []  - 0 Routine Transfer to another Facility (non-emergent condition) []  - 0 Routine Hospital Admission (non-emergent condition) []  - 0 New Admissions / Biomedical engineer / Ordering NPWT, Apligraf, etc. []  - 0 Emergency Hospital Admission (emergent condition) PROCESS - Special Needs []  - Pediatric / Minor Patient Management 0 []  - 0 Isolation Patient Management []  - 0 Hearing / Language / Visual special needs []  - 0 Assessment of Community assistance (transportation, D/C planning, etc.) []  - 0 Additional assistance / Altered mentation []  - 0 Support Surface(s) Assessment (bed, cushion, seat, etc.) [name], [name] ([id number]) INTERVENTIONS - Miscellaneous []  - External ear exam 0 []  - 0 Patient Transfer (multiple staff / Civil Service fast streamer / Similar devices) []  - 0 Simple Staple / Suture removal (25 or less) []  - 0 Complex Staple / Suture removal (26 or more) []  - 0 Hypo/Hyperglycemic Management (do not check if billed separately) []  - 0 Ankle / Brachial  Index (ABI) - do not check if billed separately Has the patient been seen at the hospital within the last three years: Yes Total Score: 60 Level Of  Care: New/Established - Level 2 Electronic Signature(s) Signed: 02/01/2019 4:31:37 PM By: [name] Entered By: [name] on 02/01/2019 08:49:51 [name] ([id number]) -------------------------------------------------------------------------------- Encounter Discharge Information Details Patient Name: [name] Date of Service: 02/01/2019 8:15 AM Medical Record Number: [id number] Patient Account Number: [id number] Date of Birth/Sex: [birth year] (38 y.o. M) Treating RN: [name] Primary Care Austine Wiedeman: [name] Other Clinician: Referring Xayden Linsey: [name] Treating Brad Mcgaughy/Extender: [name] Weeks in Treatment: 112 Encounter Discharge Information Items Discharge Condition: Stable Ambulatory Status: Ambulatory Discharge Destination: Home Transportation: Private Auto Accompanied By: self Schedule Follow-up Appointment: Yes Clinical Summary of Care: Electronic Signature(s) Signed: 02/01/2019 4:31:37 PM By: [name] Entered By: [name] on 02/01/2019 08:52:17 [name] ([id number]) -------------------------------------------------------------------------------- Lower Extremity Assessment Details Patient Name: [name] Date of Service: 02/01/2019 8:15 AM Medical Record Number: [id number] Patient Account Number: [id number] Date of Birth/Sex: July 01, 1980 (38 y.o. M) Treating RN: [name] Primary Care Priyansh Pry: [name] Other Clinician: Referring Mozelle Remlinger: [name] Treating Al Bracewell/Extender: [name] Weeks in Treatment: 112 Edema Assessment Assessed: [Left: No] [Right: No] Edema: [Left: Ye] [Right: s] Vascular Assessment Pulses: Dorsalis Pedis Palpable: [Left:Yes] Electronic Signature(s) Signed: 02/02/2019 3:57:57 PM By: [name] Entered  By: [name] on 02/01/2019 08:22:31 [name] ([id number]) -------------------------------------------------------------------------------- Multi Wound Chart Details Patient Name: [name] Date of Service: 02/01/2019 8:15 AM Medical Record Number: [id number] Patient Account Number: [id number] Date of Birth/Sex: 02-Jan-1981 (38 y.o. M) Treating RN: [name] Primary Care Floretta Petro: [name] Other Clinician: Referring Jodie Cavey: [name] Treating Denicia Pagliarulo/Extender: [name] Weeks in Treatment: 112 Vital Signs Height(in): 71 Pulse(bpm): 77 Weight(lbs): 338 Blood Pressure(mmHg): 133/81 Body Mass Index(BMI): 47 Temperature(F): 99.0 Respiratory Rate 18 (breaths/min): Photos: [N/A:N/A] Wound Location: Left Lower Leg - Lateral N/A N/A Wounding Event: Gradually Appeared N/A N/A Primary Etiology: Pyoderma N/A N/A Comorbid History: Sleep Apnea, Hypertension, N/A N/A Colitis Date Acquired: 11/18/2015 N/A N/A Weeks of Treatment: 112 N/A N/A Wound Status: Open N/A N/A Measurements L x W x D 3.3x2.4x0.4 N/A N/A (cm) Area (cm) : 6.22 N/A N/A Volume (cm) : 2.488 N/A N/A % Reduction in Area: -26.70% N/A N/A % Reduction in Volume: 36.60% N/A N/A Classification: Full Thickness With Exposed N/A N/A Support Structures Exudate Amount: Medium N/A N/A Exudate Type: Serous N/A N/A Exudate Color: amber N/A N/A Wound Margin: Distinct, outline attached N/A N/A Granulation Amount: Small (1-33%) N/A N/A Granulation Quality: Red N/A N/A Necrotic Amount: Large (67-100%) N/A N/A Exposed Structures: Fat Layer (Subcutaneous N/A N/A Tissue) Exposed: Yes Fascia: No Tendon: No Muscle: No [name], [name] ([id number]) Joint: No Bone: No Epithelialization: None N/A N/A Treatment Notes Electronic Signature(s) Signed: 02/01/2019 4:31:37 PM By: [name] Entered By: [name] on 02/01/2019 08:42:39 [name]  ([id number]) -------------------------------------------------------------------------------- Multi-Disciplinary Care Plan Details Patient Name: [name] Date of Service: 02/01/2019 8:15 AM Medical Record Number: [id number] Patient Account Number: [id number] Date of Birth/Sex: December 04, 1980 (38 y.o. M) Treating RN: [name] Primary Care Farmer Mccahill: [name] Other Clinician: Referring Garnett Rekowski: [name] Treating Nieves Barberi/Extender: [name] Weeks in Treatment: 33 Active Inactive Orientation to the Wound Care Program Nursing Diagnoses: Knowledge deficit related to the wound healing center program Goals: Patient/caregiver will verbalize understanding of the [facility] Program Date Initiated: 12/11/2016 Target Resolution Date: 03/13/2017 Goal Status: Active Interventions: Provide education on orientation to the wound center Notes: Venous Leg Ulcer Nursing Diagnoses: Knowledge deficit related to disease process and management Potential for venous Insuffiency (use before diagnosis confirmed) Goals: Non-invasive venous studies  are completed as ordered Date Initiated: 12/11/2016 Target Resolution Date: 03/13/2017 Goal Status: Active Patient will maintain optimal edema control Date Initiated: 12/11/2016 Target Resolution Date: 03/13/2017 Goal Status: Active Patient/caregiver will verbalize understanding of disease process and disease management Date Initiated: 12/11/2016 Target Resolution Date: 03/13/2017 Goal Status: Active Verify adequate tissue perfusion prior to therapeutic compression application Date Initiated: 12/11/2016 Target Resolution Date: 03/13/2017 Goal Status: Active Interventions: Assess peripheral edema status every visit. Compression as ordered Provide education on venous insufficiency Treatment Activities: [name], [name] ([id number]) Therapeutic compression applied : 12/11/2016 Notes: Wound/Skin Impairment Nursing  Diagnoses: Impaired tissue integrity Knowledge deficit related to ulceration/compromised skin integrity Goals: Patient/caregiver will verbalize understanding of skin care regimen Date Initiated: 12/11/2016 Target Resolution Date: 03/13/2017 Goal Status: Active Ulcer/skin breakdown will have a volume reduction of 30% by week 4 Date Initiated: 12/11/2016 Target Resolution Date: 03/13/2017 Goal Status: Active Ulcer/skin breakdown will have a volume reduction of 50% by week 8 Date Initiated: 12/11/2016 Target Resolution Date: 03/13/2017 Goal Status: Active Ulcer/skin breakdown will have a volume reduction of 80% by week 12 Date Initiated: 12/11/2016 Target Resolution Date: 03/13/2017 Goal Status: Active Ulcer/skin breakdown will heal within 14 weeks Date Initiated: 12/11/2016 Target Resolution Date: 03/13/2017 Goal Status: Active Interventions: Assess patient/caregiver ability to obtain necessary supplies Assess patient/caregiver ability to perform ulcer/skin care regimen upon admission and as needed Assess ulceration(s) every visit Provide education on ulcer and skin care Treatment Activities: Skin care regimen initiated : 12/11/2016 Topical wound management initiated : 12/11/2016 Notes: Electronic Signature(s) Signed: 02/01/2019 4:31:37 PM By: [name] Entered By: [name] on 02/01/2019 08:42:21 [name] ([id number]) -------------------------------------------------------------------------------- Pain Assessment Details Patient Name: [name] Date of Service: 02/01/2019 8:15 AM Medical Record Number: [id number] Patient Account Number: [id number] Date of Birth/Sex: Dec 22, 1980 (38 y.o. M) Treating RN: [name] Primary Care Seriyah Collison: [name] Other Clinician: Referring Chevon Laufer: [name] Treating Ishani Goldwasser/Extender: [name] Weeks in Treatment: 112 Active Problems Location of Pain Severity and Description of Pain Patient Has Paino  Yes Site Locations Rate the pain. Current Pain Level: 2 Pain Management and Medication Current Pain Management: Electronic Signature(s) Signed: 02/01/2019 11:28:54 AM By: [name] Signed: 02/01/2019 4:31:37 PM By: [name] Entered By: [name] Bears on 02/01/2019 08:19:40 [name] ([id number]) -------------------------------------------------------------------------------- Patient/Caregiver Education Details Patient Name: [name] Date of Service: 02/01/2019 8:15 AM Medical Record Number: [id number] Patient Account Number: [id number] Date of Birth/Gender: [birth year] (38 y.o. M) Treating RN: [name] Primary Care Physician: [name] Other Clinician: Referring Physician: Alma [name] Treating Physician/Extender: Sharalyn Ink in Treatment: 112 Education Assessment Education Provided To: Patient Education Topics Provided Wound/Skin Impairment: Handouts: Caring for Your Ulcer Methods: Demonstration, Explain/Verbal Responses: State content correctly Electronic Signature(s) Signed: 02/01/2019 4:31:37 PM By: [name] Entered By: [name] on 02/01/2019 08:50:18 [name] ([id number]) -------------------------------------------------------------------------------- Wound Assessment Details Patient Name: [name] Date of Service: 02/01/2019 8:15 AM Medical Record Number: [id number] Patient Account Number: [id number] Date of Birth/Sex: [birth year] (38 y.o. M) Treating RN: [name] Primary Care Joesiah Lonon: [name] Other Clinician: Referring Damare Serano: [name] Treating Natashia Roseman/Extender: [name] Weeks in Treatment: 112 Wound Status Wound Number: 1 Primary Etiology: Pyoderma Wound Location: Left, Lateral Lower Leg Wound Status: Open Wounding Event: Gradually Appeared Comorbid History: Sleep Apnea, Hypertension, Colitis Date Acquired: 11/18/2015 Weeks  Of Treatment: 112 Clustered Wound: No Photos Wound Measurements Length: (cm) 3.3 % Reduction Width: (cm) 2.4 % Reduction Depth: (cm) 0.3 Epitheliali Area: (cm) 6.22 Tunneling:  Volume: (cm) 1.866 Underminin in Area: -26.7% in Volume: 52.5% zation: None No g: No Wound Description Full Thickness With Exposed Support Foul Odor A Classification: Structures Slough/Fibr Wound Margin: Distinct, outline attached Exudate Medium Amount: Exudate Type: Serous Exudate Color: amber fter Cleansing: No ino Yes Wound Bed Granulation Amount: Small (1-33%) Exposed Structure Granulation Quality: Red Fascia Exposed: No Necrotic Amount: Large (67-100%) Fat Layer (Subcutaneous Tissue) Exposed: Yes Necrotic Quality: Adherent Slough Tendon Exposed: No Muscle Exposed: No Joint Exposed: No Bone Exposed: No [name], [name] ([id number]) Treatment Notes Wound #1 (Left, Lateral Lower Leg) Notes collagen, BFD Electronic Signature(s) Signed: 02/01/2019 4:31:37 PM By: [name] Entered By: [name] on 02/01/2019 08:43:42 [name] ([id number]) -------------------------------------------------------------------------------- Vitals Details Patient Name: [name] Date of Service: 02/01/2019 8:15 AM Medical Record Number: [id number] Patient Account Number: [id number] Date of Birth/Sex: 12-06-1980 (38 y.o. M) Treating RN: [name] Primary Care Jaritza Duignan: [name] Other Clinician: Referring Lamarkus Nebel: [name] Treating Teddie Mehta/Extender: [name] Weeks in Treatment: 112 Vital Signs Time Taken: 08:18 Temperature (F): 99.0 Height (in): 71 Pulse (bpm): 77 Weight (lbs): 338 Respiratory Rate (breaths/min): 18 Body Mass Index (BMI): 47.1 Blood Pressure (mmHg): 133/81 Reference Range: 80 - 120 mg / dl Electronic Signature(s) Signed: 02/01/2019 11:28:54 AM By: [name] Entered By: [name] Bears on  02/01/2019 08:20:50

## 2019-02-03 ENCOUNTER — Ambulatory Visit
Admit: 2019-02-03 | Discharge: 2019-02-04 | Payer: PRIVATE HEALTH INSURANCE | Attending: Dermatology | Primary: Dermatology

## 2019-02-03 DIAGNOSIS — L88 Pyoderma gangrenosum: Secondary | ICD-10-CM

## 2019-02-03 DIAGNOSIS — K51919 Ulcerative colitis, unspecified with unspecified complications: Secondary | ICD-10-CM

## 2019-02-03 MED ORDER — ADALIMUMAB 40 MG/0.8 ML SUBCUTANEOUS PEN KIT
SUBCUTANEOUS | 11 refills | 14 days | Status: CP
Start: 2019-02-03 — End: 2019-02-19

## 2019-02-03 NOTE — Unmapped (Signed)
Patient Education        adalimumab  Pronunciation:  AY da LIM ue mab  Brand:  Humira  What is the most important information I should know about adalimumab?  This medicine affects your immune system. You may get infections more easily, even serious or fatal infections.  Before or during treatment with adalimumab, tell your doctor if you have signs of infection such as fever, chills, aches, tiredness, cough, skin sores, diarrhea, or burning when you urinate.  What is adalimumab?  Adalimumab reduces the effects of a substance in the body that can cause inflammation.  Adalimumab is used to treat many inflammatory conditions in adults, such as ulcerative colitis, rheumatoid arthritis, psoriatic arthritis, ankylosing spondylitis, plaque psoriasis, and a skin condition called hidradenitis suppurativa.  Adalimumab is also used in adults and children to treat Crohn's disease, juvenile idiopathic arthritis, or uveitis.  Adalimumab may also be used for purposes not listed in this medication guide.  What should I discuss with my healthcare provider before using adalimumab?  You should not use this medicine if you are allergic to adalimumab.  Before you start using adalimumab, tell your doctor if you have signs of infection --fever, chills, sweats, muscle aches, tiredness, cough, bloody mucus, skin sores, diarrhea, burning when you urinate, or feeling constantly tired.  Adalimumab should not be given to a child younger than 35 years old (or 73 years old if treating Crohn's disease).  Children using adalimumab should be current on all childhood immunizations before starting treatment.  Tell your doctor if you have ever had:  ?? tuberculosis (or if anyone in your household has tuberculosis);  ?? cancer;  ?? hepatitis B (adalimumab can cause hepatitis B to come back or get worse);  ?? diabetes;  ?? congestive heart failure;  ?? any numbness or tingling, or a nerve-muscle disorder such as multiple sclerosis or Guillain-Barre syndrome; ?? an allergy to latex rubber;  ?? if you are scheduled to have major surgery; or  ?? if you have recently received or are scheduled to receive any vaccine.  Tell your doctor where you live and if you have recently traveled or plan to travel. You may be exposed to infections that are common to certain areas of the world.  Adalimumab may cause a rare type of lymphoma (cancer) of the liver, spleen, and bone marrow that can be fatal. This has occurred mainly in teenagers and young men with Crohn's disease or ulcerative colitis. However, anyone with an inflammatory autoimmune disorder may have a higher risk of lymphoma. Talk with your doctor about your own risk.  It is not known whether this medicine will harm an unborn baby. Tell your doctor if you are pregnant. Make sure any doctor caring for your newborn baby knows if you used adalimumab while you were pregnant.  It may not be safe to breast-feed a baby while you are using this medicine. Ask your doctor about any risks.  How should I use adalimumab?  Follow all directions on your prescription label and read all medication guides or instruction sheets. Use the medicine exactly as directed.  Adalimumab is injected under the skin. A healthcare provider will teach you how to properly use this medicine by yourself.  Do not start using adalimumab if you have any signs of an infection. Call your doctor for instructions.  Read and carefully follow any instruction sheet provided with your medicine. Do not use adalimumab if you do not understand the instructions for proper use. Ask your  doctor or pharmacist if you have any questions.  The dose schedule for adalimumab is highly variable and depends on the condition you are treating. Follow your doctor's dosing instructions very carefully.  Prepare your injection only when you are ready to give it. Do not use if the medicine looks cloudy or has particles in it. Call your pharmacist for new medicine.  Adalimumab affects your immune system. You may get infections more easily, even serious or fatal infections. Your doctor will need to examine you on a regular basis.  Store this medicine in its original carton in a refrigerator. Do not freeze. If you are traveling, carefully follow all patient instructions for storing your medicine during travel.  Throw away any adalimumab that has become frozen.   Use a needle and syringe only once and then place them in a puncture-proof sharps container. Follow state or local laws about how to dispose of this container. Keep it out of the reach of children and pets.  What happens if I miss a dose?  Use the medicine as soon as you remember, and then go back to your regular injection schedule. Do not use extra medicine to make up the missed dose.  What happens if I overdose?  Seek emergency medical attention or call the Poison Help line at [phone].  What should I avoid while using adalimumab?  Do not inject adalimumab into skin that is bruised, red, tender, or hard.   Avoid being near people who are sick or have infections. Tell your doctor at once if you develop signs of infection.  Do not receive a live vaccine while using adalimumab. The vaccine may not work as well during this time, and may not fully protect you from disease. Live vaccines include measles, mumps, rubella (MMR), polio, rotavirus, typhoid, yellow fever, varicella (chickenpox), or zoster (shingles).  What are the possible side effects of adalimumab?  Get emergency medical help if you have any of these signs of an allergic reaction: hives; difficulty breathing; swelling of your face, lips, tongue, or throat.  Stop using adalimumab and call your doctor right away if you have any symptoms of lymphoma:  ?? fever, swollen glands, night sweats, general feeling of illness;  ?? joint and muscle pain, skin rash, easy bruising or bleeding;  ?? pale skin, feeling light-headed or short of breath, cold hands and feet;  ?? pain in your upper stomach that may spread to your shoulder; or  ?? loss of appetite, feeling full after eating only a small amount, weight loss.  Also call your doctor at once if you have:  ?? new or worsening psoriasis (raised, silvery flaking of the skin);  ?? liver problems --fever, body aches, tiredness, stomach pain, right-sided upper stomach pain, vomiting, loss of appetite, dark urine, clay-colored stools, jaundice (yellowing of the skin or eyes);  ?? lupus-like syndrome --joint pain or swelling, chest pain, shortness of breath, patchy skin color that worsens in sunlight;  ?? nerve problems --numbness, tingling, dizziness, vision problems, weakness in your arms or legs; or  ?? signs of tuberculosis --fever with ongoing cough, weight loss (fat or muscle).  Older adults may be more likely to develop infections or cancer while using adalimumab.  Common side effects may include:  ?? headache;  ?? cold symptoms such as stuffy nose, sinus pain, sneezing, sore throat;  ?? rash; or  ?? redness, bruising, itching, or swelling where the injection was given.  This is not a complete list of side effects and others  may occur. Call your doctor for medical advice about side effects. You may report side effects to FDA at 1-800-FDA-1088.  What other drugs will affect adalimumab?  Some drugs should not be used together with adalimumab. Tell your doctor about all medicines you use, and those you start or stop using during your treatment with adalimumab, especially:  ?? abatacept, etanercept;  ?? anakinra;  ?? azathioprine, mercaptopurine; or  ?? certolizumab, golimumab, infliximab, rituximab.  This list is not complete. Other drugs may interact with adalimumab, including prescription and over-the-counter medicines, vitamins, and herbal products. Not all possible interactions are listed in this medication guide.  Where can I get more information?  Your pharmacist can provide more information about adalimumab.  Remember, keep this and all other medicines out of the reach of children, never share your medicines with others, and use this medication only for the indication prescribed.   Every effort has been made to ensure that the information provided by [employer] ('[employer]') is accurate, up-to-date, and complete, but no guarantee is made to that effect. Drug information contained herein may be time sensitive. [employer] information has been compiled for use by healthcare practitioners and consumers in the [place] and therefore [employer] does not warrant that uses outside of the [place] are appropriate, unless specifically indicated otherwise. [employer]'s drug information does not endorse drugs, diagnose patients or recommend therapy. [employer]'s drug information is an [employer] corporate to assist licensed healthcare practitioners in caring for their patients and/or to serve consumers viewing this service as a supplement to, and not a substitute for, the expertise, skill, knowledge and judgment of healthcare practitioners. The absence of a warning for a given drug or drug combination in no way should be construed to indicate that the drug or drug combination is safe, effective or appropriate for any given patient. [employer] does not assume any responsibility for any aspect of healthcare administered with the aid of information [employer] provides. The information contained herein is not intended to cover all possible uses, directions, precautions, warnings, drug interactions, allergic reactions, or adverse effects. If you have questions about the drugs you are taking, check with your doctor, nurse or pharmacist.  Copyright [id number] [employer] Version: 13.03. Revision date: 03/17/2017.  Care instructions adapted under license by [facility]. If you have questions about a medical condition or this instruction, always ask your healthcare professional. [employer] disclaims any warranty or liability for your use of this information.       Patient Education        triamcinolone (injection)  Pronunciation:  TRYE am SIN oh lone  Brand:  [id number], Zilretta  What is the most important information I should know about triamcinolone injection?  You may not be able to receive this medicine if you have a fungal infection, or a condition called idiopathic thrombocytopenic purpura (ITP).  What is triamcinolone injection?  Triamcinolone is a steroid that prevents the release of substances in the body that cause inflammation.  Triamcinolone injection is used to treat many different types of inflammatory conditions, including severe allergic reactions, skin disorders, severe colitis, inflammation of the joints or tendons, blood cell disorders, inflammatory eye disorders, lung disorders, and problems caused by low adrenal gland hormones.  Triamcinolone is also used to treat certain skin disorders caused by autoimmune conditions such as lupus, psoriasis, lichen planus, and others.  Different brands of triamcinolone injection have different uses.   Triamcinolone injection may also be used for purposes not listed in this  medication guide.  What should I discuss with my healthcare provider before receiving triamcinolone injection?  You should not be treated with triamcinolone if you are allergic to it.  You may not be able to receive triamcinolone injection if you have a fungal infection, or a condition called idiopathic thrombocytopenic purpura (ITP).  Tell your doctor if you have ever had:  ?? an active or chronic infection, including tuberculosis;  ?? idiopathic thrombocytopenic purpura (ITP);  ?? high blood pressure, heart problems;  ?? cataracts, glaucoma, or herpes infection of the eyes;  ?? a parasite infection that causes diarrhea (such as threadworms);  ?? a nerve-muscle disorder, such as myasthenia gravis;  ?? a stomach ulcer, diverticulitis, ulcerative colitis;  ?? a colostomy or ileostomy, or stomach surgery;  ?? low bone mineral density; or  ?? a problem with your thyroid or adrenal gland.  Tell your doctor if you are pregnant or breastfeeding.  How is triamcinolone injection given?  Triamcinolone injection is given through a needle and can be injected into different areas of the body: into a muscle, into the space around a joint or tendon, or into a lesion on the skin. A healthcare provider will give you this injection.  Not every brand of this medicine is used for the same conditions or injected into the same body areas. Some brands are given only one time as needed. Others may be given at regular intervals. Carefully follow your doctor's dosing instructions.  Triamcinolone can weaken (suppress) your immune system, and you may get an infection more easily. Call your doctor if you have unusual bruising or bleeding, or signs of infection (fever, weakness, cold or flu symptoms, skin sores, diarrhea, frequent or recurring illness).  Long-term use of steroids can cause harmful effects on the eyes. If you receive triamcinolone injection for longer than 6 weeks, your doctor may want you to have regular eye exams.  Your doctor may instruct you to limit your salt intake while you are receiving triamcinolone injection. You may also need to take potassium supplements. Follow all instructions.  This medicine can affect the results of certain medical tests. Tell any doctor who treats you that you are using triamcinolone.  You should not stop using triamcinolone suddenly after long-term repeated use, or you could have unpleasant withdrawal symptoms. Ask your doctor how to safely stop using this medicine.  What happens if I miss a dose?  Call your doctor for instructions if you miss an appointment for a scheduled triamcinolone injection.  When triamcinolone is used as a single dose, you will not be on a regular dosing schedule.  What happens if I overdose?  Since this medicine is given by a healthcare professional in a medical setting, an overdose is unlikely to occur.  Using too much triamcinolone is not likely to cause serious problems. However, long term use of high doses can lead to thinning skin, easy bruising, changes in body fat (especially in your face, neck, back, and waist), increased acne or facial hair, menstrual problems, impotence, or loss of interest in sex.  What should I avoid while receiving triamcinolone injection?  After injection of triamcinolone into a joint, avoid overusing that joint through strenuous activity or high-impact sports. You could cause damage to the joint.  Avoid being near people who are sick or have infections. Call your doctor for preventive treatment if you are exposed to chickenpox or measles. These conditions can be serious or even fatal in people who are using triamcinolone.  Do not  receive a live vaccine or a toxoid vaccine while using triamcinolone, or you could develop a serious infection. Live vaccines include measles, mumps, rubella (MMR), polio, rotavirus, typhoid, yellow fever, varicella (chickenpox), zoster (shingles), and nasal flu (influenza) vaccine. Toxoid vaccines include diphtheria-tetanus toxoid (DT or Td).  What are the possible side effects of triamcinolone injection?  Get emergency medical help if you have signs of an allergic reaction: hives; difficult breathing; swelling of your face, lips, tongue, or throat.  Call your doctor at once if you have:  ?? (after injection into a joint space) increased pain or swelling, joint stiffness, fever, and general ill feeling;  ?? blurred vision, tunnel vision, eye pain, or seeing halos around lights;  ?? unusual changes in mood or behavior;  ?? swelling, rapid weight gain, feeling short of breath;  ?? stomach cramps, vomiting, diarrhea, bloody or tarry stools, rectal irritation;  ?? sudden numbness or weakness (especially on one side of the body);  ?? a seizure (convulsions);  ?? severe headache, blurred vision, pounding in your neck or ears;  ?? increased pressure inside the skull --severe headaches, ringing in your ears, dizziness, nausea, vision problems, pain behind your eyes; or  ?? signs of low adrenal gland hormones --flu-like symptoms, headache, depression, weakness, tiredness, diarrhea, vomiting, stomach pain, craving salty foods, and feeling light-headed.  Certain side effects may be more likely with long-term use or repeated doses of triamcinolone injection.  Steroids can affect growth in children. Tell your doctor if your child is not growing at a normal rate while using this medicine.  Common side effects may include:  ?? skin changes (acne, dryness, redness, bruising, discoloration);  ?? increased hair growth, or thinning hair;  ?? nausea, bloating, appetite changes;  ?? stomach or side pain;  ?? cough, runny or stuffy nose;  ?? headache, sleep problems (insomnia);  ?? a wound that is slow to heal;  ?? sweating more than usual; or  ?? changes in your menstrual periods.  This is not a complete list of side effects and others may occur. Call your doctor for medical advice about side effects. You may report side effects to FDA at 1-800-FDA-1088.  What other drugs will affect triamcinolone injection?  Sometimes it is not safe to use certain medications at the same time. Some drugs can affect your blood levels of other drugs you take, which may increase side effects or make the medications less effective.  Tell your doctor about all your current medicines. Many drugs can affect triamcinolone, especially:  ?? an antibiotic or antifungal medication;  ?? birth control pills or hormone replacement therapy;  ?? a blood thinner (warfarin, Coumadin, and others);  ?? a diuretic or water pill;  ?? insulin or oral diabetes medicine;  ?? medicine to treat tuberculosis;  ?? a nonsteroidal anti-inflammatory drug or NSAID (aspirin, ibuprofen, naproxen, diclofenac, indomethacin, Advil, Aleve, Celebrex, and many others); or  ?? seizure medication.  This list is not complete and many other drugs may affect triamcinolone. This includes prescription and over-the-counter medicines, vitamins, and herbal products. Not all possible drug interactions are listed here.  Where can I get more information?  Your doctor or pharmacist can provide more information about triamcinolone injection.  Remember, keep this and all other medicines out of the reach of children, never share your medicines with others, and use this medication only for the indication prescribed.   Every effort has been made to ensure that the information provided by [employer] is accurate,  up-to-date, and complete, but no guarantee is made to that effect. Drug information contained herein may be time sensitive. [employer] information has been compiled for use by healthcare practitioners and consumers in the [place] and therefore [employer] does not warrant that uses outside of the [place] are appropriate, unless specifically indicated otherwise. [employer]'s drug information does not endorse drugs, diagnose patients or recommend therapy. [employer]'s drug information is an [employer] corporate to assist licensed healthcare practitioners in caring for their patients and/or to serve consumers viewing this service as a supplement to, and not a substitute for, the expertise, skill, knowledge and judgment of healthcare practitioners. The absence of a warning for a given drug or drug combination in no way should be construed to indicate that the drug or drug combination is safe, effective or appropriate for any given patient. [employer] does not assume any responsibility for any aspect of healthcare administered with the aid of information [employer] provides. The information contained herein is not intended to cover all possible uses, directions, precautions, warnings, drug interactions, allergic reactions, or adverse effects. If you have questions about the drugs you are taking, check with your doctor, nurse or pharmacist.  Copyright [phone] [employer] Version: 4.06. Revision date: 05/18/2018.  Care instructions adapted under license by [facility]. If you have questions about a medical condition or this instruction, always ask your healthcare professional. [employer] disclaims any warranty or liability for your use of this information.

## 2019-02-03 NOTE — Unmapped (Signed)
Per test claim for Humira at the [facility], patient needs Medication Assistance Program for Prior Authorization.

## 2019-02-04 LAB — AEROBIC CULTURE W GRAM STAIN (SUPERFICIAL SPECIMEN): Gram Stain: NONE SEEN

## 2019-02-08 ENCOUNTER — Encounter: Payer: 59 | Admitting: Physician Assistant

## 2019-02-08 ENCOUNTER — Other Ambulatory Visit: Payer: Self-pay

## 2019-02-08 DIAGNOSIS — L97222 Non-pressure chronic ulcer of left calf with fat layer exposed: Secondary | ICD-10-CM | POA: Diagnosis not present

## 2019-02-08 NOTE — Progress Notes (Signed)
[name], [name] ([id number]) Visit Report for 02/08/2019 Arrival Information Details Patient Name: [name], [name] Date of Service: 02/08/2019 8:15 AM Medical Record Number: [id number] Patient Account Number: [id number] Date of Birth/Sex: 22-Feb-1981 (38 y.o. M) Treating RN: Harold Barban Primary Care Kiyona Mcnall: [name] Other Clinician: Referring Genette Huertas: [name] Treating Rickiya Picariello/Extender: Melburn Hake, [name]: 52 Visit Information History Since Last Visit Added or deleted any medications: No Patient Arrived: Ambulatory Any new allergies or adverse reactions: No Arrival Time: 08:15 Had a fall or experienced change in No Accompanied By: self activities of daily living that may affect Transfer Assistance: None risk of falls: Patient Requires Transmission-Based No Signs or symptoms of abuse/neglect since last visito No Precautions: Hospitalized since last visit: No Patient Has Alerts: No Implantable device outside of the clinic excluding No cellular tissue based products placed in the center since last visit: Has Dressing in Place as Prescribed: Yes Pain Present Now: Yes Electronic Signature(s) Signed: 02/08/2019 4:32:55 PM By: Lorine Bears RCP, RRT, CHT Entered By: Lorine Bears on 02/08/2019 08:15:27 [name] ([id number]) -------------------------------------------------------------------------------- Clinic Level of Care Assessment Details Patient Name: [name] Date of Service: 02/08/2019 8:15 AM Medical Record Number: [id number] Patient Account Number: [id number] Date of Birth/Sex: Jan 24, 1981 (38 y.o. M) Treating RN: Harold Barban Primary Care Antoniette Peake: [name] Other Clinician: Referring Brandyn Lowrey: [name] Treating Jin Shockley/Extender: Melburn Hake, [name]: 113 Clinic Level of Care Assessment Items TOOL 4 Quantity Score []  - Use when only an EandM is performed on FOLLOW-UP  visit 0 ASSESSMENTS - Nursing Assessment / Reassessment X - Reassessment of Co-morbidities (includes updates in patient status) 1 10 X- 1 5 Reassessment of Adherence to Treatment Plan ASSESSMENTS - Wound and Skin Assessment / Reassessment X - Simple Wound Assessment / Reassessment - one wound 1 5 []  - 0 Complex Wound Assessment / Reassessment - multiple wounds []  - 0 Dermatologic / Skin Assessment (not related to wound area) ASSESSMENTS - Focused Assessment []  - Circumferential Edema Measurements - multi extremities 0 []  - 0 Nutritional Assessment / Counseling / Intervention []  - 0 Lower Extremity Assessment (monofilament, tuning fork, pulses) []  - 0 Peripheral Arterial Disease Assessment (using hand held doppler) ASSESSMENTS - Ostomy and/or Continence Assessment and Care []  - Incontinence Assessment and Management 0 []  - 0 Ostomy Care Assessment and Management (repouching, etc.) PROCESS - Coordination of Care X - Simple Patient / Family Education for ongoing care 1 15 []  - 0 Complex (extensive) Patient / Family Education for ongoing care []  - 0 Staff obtains Programmer, systems, Records, Test Results / Process Orders []  - 0 Staff telephones HHA, Nursing Homes / Clarify orders / etc []  - 0 Routine Transfer to another Facility (non-emergent condition) []  - 0 Routine Hospital Admission (non-emergent condition) []  - 0 New Admissions / Biomedical engineer / Ordering NPWT, Apligraf, etc. []  - 0 Emergency Hospital Admission (emergent condition) X- 1 10 Simple Discharge Coordination [name], [name] ([id number]) []  - 0 Complex (extensive) Discharge Coordination PROCESS - Special Needs []  - Pediatric / Minor Patient Management 0 []  - 0 Isolation Patient Management []  - 0 Hearing / Language / Visual special needs []  - 0 Assessment of Community assistance (transportation, D/C planning, etc.) []  - 0 Additional assistance / Altered mentation []  - 0 Support Surface(s) Assessment (bed,  cushion, seat, etc.) INTERVENTIONS - Wound Cleansing / Measurement X - Simple Wound Cleansing - one wound 1 5 []  - 0 Complex Wound Cleansing - multiple wounds X- 1 5 Wound Imaging (  photographs - any number of wounds) []  - 0 Wound Tracing (instead of photographs) X- 1 5 Simple Wound Measurement - one wound []  - 0 Complex Wound Measurement - multiple wounds INTERVENTIONS - Wound Dressings X - Small Wound Dressing one or multiple wounds 1 10 []  - 0 Medium Wound Dressing one or multiple wounds []  - 0 Large Wound Dressing one or multiple wounds X- 1 5 Application of Medications - topical []  - 0 Application of Medications - injection INTERVENTIONS - Miscellaneous []  - External ear exam 0 []  - 0 Specimen Collection (cultures, biopsies, blood, body fluids, etc.) []  - 0 Specimen(s) / Culture(s) sent or taken to Lab for analysis []  - 0 Patient Transfer (multiple staff / Civil Service fast streamer / Similar devices) []  - 0 Simple Staple / Suture removal (25 or less) []  - 0 Complex Staple / Suture removal (26 or more) []  - 0 Hypo / Hyperglycemic Management (close monitor of Blood Glucose) []  - 0 Ankle / Brachial Index (ABI) - do not check if billed separately X- 1 5 Vital Signs [name], [name] ([id number]) Has the patient been seen at the hospital within the last three years: Yes Total Score: 80 Level Of Care: New/Established - Level 3 Electronic Signature(s) Signed: 02/08/2019 4:36:39 PM By: Harold Barban Entered By: Harold Barban on 02/08/2019 09:06:46 [name] ([id number]) -------------------------------------------------------------------------------- Encounter Discharge Information Details Patient Name: [name] Date of Service: 02/08/2019 8:15 AM Medical Record Number: [id number] Patient Account Number: [id number] Date of Birth/Sex: 1980-07-26 (38 y.o. M) Treating RN: Harold Barban Primary Care Madissen Wyse: [name] Other Clinician: Referring Gertrude Bucks: [name] Treating Ivanka Kirshner/Extender: Melburn Hake, [name]: 113 Encounter Discharge Information Items Discharge Condition: Stable Ambulatory Status: Ambulatory Discharge Destination: Home Transportation: Private Auto Accompanied By: self Schedule Follow-up Appointment: Yes Clinical Summary of Care: Electronic Signature(s) Signed: 02/08/2019 4:36:39 PM By: Harold Barban Entered By: Harold Barban on 02/08/2019 [name], [name] ([id number]) -------------------------------------------------------------------------------- Lower Extremity Assessment Details Patient Name: [name] Date of Service: 02/08/2019 8:15 AM Medical Record Number: [id number] Patient Account Number: [id number] Date of Birth/Sex: 05-26-1980 (38 y.o. M) Treating RN: Army Melia Primary Care Lanore Renderos: [name] Other Clinician: Referring Cannie Muckle: [name] Treating Bereket Gernert/Extender: Melburn Hake, [name]: 113 Edema Assessment Assessed: [Left: No] [Right: No] Edema: [Left: N] [Right: o] Vascular Assessment Pulses: Dorsalis Pedis Palpable: [Left:Yes] Electronic Signature(s) Signed: 02/08/2019 8:46:19 AM By: Army Melia Entered By: Army Melia on 02/08/2019 08:18:29 [name] ([id number]) -------------------------------------------------------------------------------- Multi Wound Chart Details Patient Name: [name] Date of Service: 02/08/2019 8:15 AM Medical Record Number: [id number] Patient Account Number: [id number] Date of Birth/Sex: 1981-02-10 (38 y.o. M) Treating RN: Harold Barban Primary Care Psalm Arman: [name] Other Clinician: Referring Dashonda Bonneau: [name] Treating Kaysee Hergert/Extender: Melburn Hake, [name]: 113 Vital Signs Height(in): 71 Pulse(bpm): 69 Weight(lbs): 338 Blood Pressure(mmHg): 150/83 Body Mass Index(BMI): 47 Temperature(F): 98.5 Respiratory Rate 16 (breaths/min): Photos: [N/A:N/A] Wound  Location: Left Lower Leg - Lateral N/A N/A Wounding Event: Gradually Appeared N/A N/A Primary Etiology: Pyoderma N/A N/A Comorbid History: Sleep Apnea, Hypertension, N/A N/A Colitis Date Acquired: 11/18/2015 N/A N/A Weeks of Treatment: 113 N/A N/A Wound Status: Open N/A N/A Measurements L x W x D 3.6x2.5x0.3 N/A N/A (cm) Area (cm) : 7.069 N/A N/A Volume (cm) : 2.121 N/A N/A % Reduction in Area: -44.00% N/A N/A % Reduction in Volume: 46.00% N/A N/A Classification: Full Thickness With Exposed N/A N/A Support Structures Exudate Amount: Medium N/A N/A Exudate Type: Serous N/A N/A Exudate  Color: amber N/A N/A Wound Margin: Distinct, outline attached N/A N/A Granulation Amount: Small (1-33%) N/A N/A Granulation Quality: Red N/A N/A Necrotic Amount: Large (67-100%) N/A N/A Exposed Structures: Fat Layer (Subcutaneous N/A N/A Tissue) Exposed: Yes Fascia: No Tendon: No Muscle: No [name], [name] ([id number]) Joint: No Bone: No Epithelialization: None N/A N/A Treatment Notes Electronic Signature(s) Signed: 02/08/2019 4:36:39 PM By: Harold Barban Entered By: Harold Barban on 02/08/2019 09:05:25 [name] ([id number]) -------------------------------------------------------------------------------- Multi-Disciplinary Care Plan Details Patient Name: [name] Date of Service: 02/08/2019 8:15 AM Medical Record Number: [id number] Patient Account Number: [id number] Date of Birth/Sex: 03-12-1981 (38 y.o. M) Treating RN: Harold Barban Primary Care Kaci Freel: [name] Other Clinician: Referring Erynne Kealey: [name] Treating Delanda Bulluck/Extender: Melburn Hake, [name]: 60 Active Inactive Orientation to the Wound Care Program Nursing Diagnoses: Knowledge deficit related to the wound healing center program Goals: Patient/caregiver will verbalize understanding of the [facility] Program Date Initiated: 12/11/2016 Target Resolution Date:  03/13/2017 Goal Status: Active Interventions: Provide education on orientation to the wound center Notes: Venous Leg Ulcer Nursing Diagnoses: Knowledge deficit related to disease process and management Potential for venous Insuffiency (use before diagnosis confirmed) Goals: Non-invasive venous studies are completed as ordered Date Initiated: 12/11/2016 Target Resolution Date: 03/13/2017 Goal Status: Active Patient will maintain optimal edema control Date Initiated: 12/11/2016 Target Resolution Date: 03/13/2017 Goal Status: Active Patient/caregiver will verbalize understanding of disease process and disease management Date Initiated: 12/11/2016 Target Resolution Date: 03/13/2017 Goal Status: Active Verify adequate tissue perfusion prior to therapeutic compression application Date Initiated: 12/11/2016 Target Resolution Date: 03/13/2017 Goal Status: Active Interventions: Assess peripheral edema status every visit. Compression as ordered Provide education on venous insufficiency Treatment Activities: [name], [name] ([id number]) Therapeutic compression applied : 12/11/2016 Notes: Wound/Skin Impairment Nursing Diagnoses: Impaired tissue integrity Knowledge deficit related to ulceration/compromised skin integrity Goals: Patient/caregiver will verbalize understanding of skin care regimen Date Initiated: 12/11/2016 Target Resolution Date: 03/13/2017 Goal Status: Active Ulcer/skin breakdown will have a volume reduction of 30% by week 4 Date Initiated: 12/11/2016 Target Resolution Date: 03/13/2017 Goal Status: Active Ulcer/skin breakdown will have a volume reduction of 50% by week 8 Date Initiated: 12/11/2016 Target Resolution Date: 03/13/2017 Goal Status: Active Ulcer/skin breakdown will have a volume reduction of 80% by week 12 Date Initiated: 12/11/2016 Target Resolution Date: 03/13/2017 Goal Status: Active Ulcer/skin breakdown will heal within 14 weeks Date Initiated:  12/11/2016 Target Resolution Date: 03/13/2017 Goal Status: Active Interventions: Assess patient/caregiver ability to obtain necessary supplies Assess patient/caregiver ability to perform ulcer/skin care regimen upon admission and as needed Assess ulceration(s) every visit Provide education on ulcer and skin care Treatment Activities: Skin care regimen initiated : 12/11/2016 Topical wound management initiated : 12/11/2016 Notes: Electronic Signature(s) Signed: 02/08/2019 4:36:39 PM By: Harold Barban Entered By: Harold Barban on 02/08/2019 [name], [name] ([id number]) -------------------------------------------------------------------------------- Pain Assessment Details Patient Name: [name] Date of Service: 02/08/2019 8:15 AM Medical Record Number: [id number] Patient Account Number: [id number] Date of Birth/Sex: Mar 01, 1981 (38 y.o. M) Treating RN: Harold Barban Primary Care Mubarak Bevens: [name] Other Clinician: Referring Kenya Shiraishi: [name] Treating Daneisha Surges/Extender: Melburn Hake, [name]: 113 Active Problems Location of Pain Severity and Description of Pain Patient Has Paino Yes Site Locations Rate the pain. Current Pain Level: 2 Pain Management and Medication Current Pain Management: Electronic Signature(s) Signed: 02/08/2019 4:32:55 PM By: Lorine Bears RCP, RRT, CHT Signed: 02/08/2019 4:36:39 PM By: Harold Barban Entered By: Lorine Bears on 02/08/2019 08:15:37 [name], [name] ([id number]) -------------------------------------------------------------------------------- Patient/Caregiver  Education Details Patient Name: [name], [name] Date of Service: 02/08/2019 8:15 AM Medical Record Number: [id number] Patient Account Number: [id number] Date of Birth/Gender: [birth year] (38 y.o. M) Treating RN: Harold Barban Primary Care Physician: [name] Other Clinician: Referring Physician: [name] Treating Physician/Extender: Sharalyn Ink in Treatment: 113 Education Assessment Education Provided To: Patient Education Topics Provided Infection: Handouts: Infection Prevention and Management Methods: Demonstration, Explain/Verbal Responses: State content correctly Wound/Skin Impairment: Handouts: Caring for Your Ulcer Methods: Demonstration, Explain/Verbal Responses: State content correctly Electronic Signature(s) Signed: 02/08/2019 4:36:39 PM By: Harold Barban Entered By: Harold Barban on 02/08/2019 09:09:07 [name] ([id number]) -------------------------------------------------------------------------------- Wound Assessment Details Patient Name: [name] Date of Service: 02/08/2019 8:15 AM Medical Record Number: [id number] Patient Account Number: [id number] Date of Birth/Sex: 10-09-1980 (39 y.o. M) Treating RN: Army Melia Primary Care Idriss Quackenbush: [name] Other Clinician: Referring Terrick Allred: [name] Treating Mikeya Tomasetti/Extender: Melburn Hake, [name]: 113 Wound Status Wound Number: 1 Primary Etiology: Pyoderma Wound Location: Left Lower Leg - Lateral Wound Status: Open Wounding Event: Gradually Appeared Comorbid History: Sleep Apnea, Hypertension, Colitis Date Acquired: 11/18/2015 Weeks Of Treatment: 113 Clustered Wound: No Photos Wound Measurements Length: (cm) 3.6 % Reduction Width: (cm) 2.5 % Reduction Depth: (cm) 0.3 Epitheliali Area: (cm) 7.069 Tunneling: Volume: (cm) 2.121 Underminin in Area: -44% in Volume: 46% zation: None No g: No Wound Description Full Thickness With Exposed Support Foul Odor A Classification: Structures Slough/Fibr Wound Margin: Distinct, outline attached Exudate Medium Amount: Exudate Type: Serous Exudate Color: amber fter Cleansing: No ino Yes Wound Bed Granulation Amount: Small (1-33%) Exposed Structure Granulation Quality: Red Fascia Exposed: No Necrotic  Amount: Large (67-100%) Fat Layer (Subcutaneous Tissue) Exposed: Yes Necrotic Quality: Adherent Slough Tendon Exposed: No Muscle Exposed: No Joint Exposed: No Bone Exposed: No [place], [place] ([id number]) Treatment Notes Wound #1 (Left, Lateral Lower Leg) Notes TCA to wound bed only, BFD Electronic Signature(s) Signed: 02/08/2019 8:46:19 AM By: Army Melia Entered By: Army Melia on 02/08/2019 08:18:04 [name] ([id number]) -------------------------------------------------------------------------------- Vitals Details Patient Name: [name] Date of Service: 02/08/2019 8:15 AM Medical Record Number: [id number] Patient Account Number: [id number] Date of Birth/Sex: Nov 27, 1980 (38 y.o. M) Treating RN: Harold Barban Primary Care Stefania Goulart: [name] Other Clinician: Referring Clark Clowdus: [name] Treating Betzayda Braxton/Extender: Melburn Hake, [name]: 113 Vital Signs Time Taken: 08:15 Temperature (F): 98.5 Height (in): 71 Pulse (bpm): 69 Weight (lbs): 338 Respiratory Rate (breaths/min): 16 Body Mass Index (BMI): 47.1 Blood Pressure (mmHg): 150/83 Reference Range: 80 - 120 mg / dl Electronic Signature(s) Signed: 02/08/2019 4:32:55 PM By: Lorine Bears RCP, RRT, CHT Entered By: Lorine Bears on 02/08/2019 08:16:10

## 2019-02-08 NOTE — Progress Notes (Signed)
[name], [name] ([id number]) Visit Report for 02/08/2019 Chief Complaint Document Details Patient Name: [name], [name] Date of Service: 02/08/2019 8:15 AM Medical Record Number: [id number] Patient Account Number: [id number] Date of Birth/Sex: 07-Jan-1981 (38 y.o. M) Treating RN: [name] Primary Care Provider: Alma [name] Other Clinician: Referring Provider: Alma [name] Treating Provider/Extender: [name], [name] in Treatment: 113 Information Obtained from: Patient Chief Complaint He is here in follow up evaluation for LLE pyoderma ulcer Electronic Signature(s) Signed: 02/08/2019 4:43:35 PM By: [name] Keeler PA-C Entered By: [name] on 02/08/2019 08:17:56 [name], [name] ([id number]) -------------------------------------------------------------------------------- HPI Details Patient Name: [name] Date of Service: 02/08/2019 8:15 AM Medical Record Number: [id number] Patient Account Number: [id number] Date of Birth/Sex: [birth year] (38 y.o. M) Treating RN: [name] Primary Care Provider: Alma [name] Other Clinician: Referring Provider: Alma [name] Treating Provider/Extender: [name] [name] in Treatment: 113 History of Present Illness HPI Description: 12/04/16; 38 year old man who comes into the clinic today for review of a wound on the posterior left calf. He tells me that is been there for about a year. He is not a diabetic he does smoke half a pack per day. He was seen in the ER on 11/20/16 felt to have cellulitis around the wound and was given clindamycin. An x-ray did not show osteomyelitis. The patient initially tells me that he has a milk allergy that sets off a pruritic itching rash on his lower legs which she scratches incessantly and he thinks that's what may have set up the wound. He has been using various topical antibiotics and ointments without any effect. He works in a [employer] and is on his feet all day. He does not have a  prior history of wounds however he does have the rash on both lower legs the right arm and the ventral aspect of his left arm. These are excoriations and clearly have had scratching however there are of macular looking areas on both legs including a substantial larger area on the right leg. This does not have an underlying open area. There is no blistering. The patient tells me that 2 years ago in [place] in response to the rash on his legs he saw a dermatologist who told him he had a condition which may be pyoderma gangrenosum although I may be putting words into his mouth. He seemed to recognize this. 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 He is not in any treatment for this. He's had no recent travel 12/11/16; the patient arrives today with his wound and roughly the same condition we've been using silver alginate this is a deep punched out wound with some surrounding erythema but no tenderness. Biopsy I did did not show confirmed pyoderma gangrenosum suggested nonspecific inflammation and vasculitis but does not provide an actual description of what was seen by the pathologist. I'm really not able to understand this We have also received information from the patient's dermatologist in [place] notes from April 2016. This was a doctor Agarwal- antal. 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 He was prescribed topical steroid high potency under occlusion which helped but at this point the patient did not have a deep punched out wound. 12/18/16; the patient's wound is larger in terms of surface area however this surface looks better and there is less depth. The surrounding erythema also is better. The patient states that the wrap we put on came off 2 days ago when he has been using  his compression stockings. He we are in the process of getting a dermatology consult. 12/26/16 on evaluation today patient's left lower extremity wound shows evidence  of infection with surrounding erythema noted. He has been tolerating the dressing changes but states that he has noted more discomfort. There is a larger area of erythema surrounding the wound. No fevers, chills, nausea, or vomiting noted at this time. With that being said the wound still does have slough covering the surface. He is not allergic to any medication that he is aware of at this point. In regard to his right lower extremity he had several regions that are erythematous and pruritic he wonders if there's anything we can do to help that. 01/02/17 I reviewed patient's wound culture which was obtained his visit last week. He was placed on doxycycline at that point. Unfortunately that does not appear to be an antibiotic that would likely help with the situation however the pseudomonas noted on culture is sensitive to Cipro. Also unfortunately patient's wound seems to have a large compared to last week's evaluation. Not severely so but there are definitely increased measurements in general. He is continuing to have discomfort as well he writes this to be a seven out of 10. In fact he would prefer me not to perform any debridement today due to the fact that he is having discomfort and considering he has an active infection on the little reluctant to do so anyway. No fevers, chills, nausea, or vomiting noted at this time. 01/08/17; patient seems dermatology on September 5. I suspect dermatology will want the slides from the biopsy I did sent to their pathologist. I'm not sure if there is a way we can expedite that. In any case the culture I did before I left on vacation 3 [name] ago showed Pseudomonas he was given 10 days of Cipro and per her description of her intake nurses is actually somewhat better this week although the wound is quite a bit bigger than I remember the last time I saw this. He still has 3 more days of Cipro [name], [name] ([id number]) 01/21/17; dermatology appointment tomorrow. He  has completed the ciprofloxacin for Pseudomonas. Surface of the wound looks better however he is had some deterioration in the lesions on his right leg. Meantime the left lateral leg wound we will continue with sample 01/29/17; patient had his dermatology appointment but I can't yet see that note. He is completed his antibiotics. The wound is more superficial but considerably larger in circumferential area than when he came in. This is in his left lateral calf. He also has swollen erythematous areas with superficial wounds on the right leg and small papular areas on both arms. There apparently areas in her his upper thighs and buttocks I did not look at those. Dermatology biopsied the right leg. Hopefully will have their input next week. 02/05/17; patient went back to see his dermatologist who told him that he had a "scratching problem" as well as staph. He is now on a 30 day course of doxycycline and I believe she gave him triamcinolone cream to the right leg areas to help with the itching [not exactly sure but probably triamcinolone]. She apparently looked at the left lateral leg wound although this was not rebiopsied and I think felt to be ultimately part of the same pathogenesis. He is using sample border foam and changing nevus himself. He now has a new open area on the right posterior leg which was his biopsy site I don't  have any of the dermatology notes 02/12/17; we put the patient in compression last week with SANTYL to the wound on the left leg and the biopsy. Edema is much better and the depth of the wound is now at level of skin. Area is still the same oBiopsy site on the right lateral leg we've also been using santyl with a border foam dressing and he is changing this himself. 02/19/17; Using silver alginate started last week to both the substantial left leg wound and the biopsy site on the right wound. He is tolerating compression well. Has a an appointment with his primary M.D. tomorrow  wondering about diuretics although I'm wondering if the edema problem is actually lymphedema 02/26/17; the patient has been to see his primary doctor Dr. Jerrel Ivory at [place] our primary care. She started him on Lasix 20 mg and this seems to have helped with the edema. However we are not making substantial change with the left lateral calf wound and inflammation. The biopsy site on the right leg also looks stable but not really all that different. 03/12/17; the patient has been to see vein and vascular Dr. Lucky Cowboy. He has had venous reflux studies I have not reviewed these. I did get a call from his dermatology office. 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 They therefore recommended clobetasol and tetracycline i.e. 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 03/26/17; apparently the patient just had reflux studies not an appointment with Dr. dew. She arrives in clinic today having applied clobetasol for 2-3 [name]. He notes over the last 2-3 days excessive drainage having to change the dressing 3-4 times a day and also expanding erythema. 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 Lillia Carmel at [facility] dermatology locally who kindly saw him at my request. 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 My concern was the degree of erythema medially extending from his left leg wound which was either inflammation from  pyoderma or cellulitis. I put him on Augmentin however culture of the wound showed Pseudomonas which is quinolone sensitive. I really don't believe he has cellulitis however in view of everything I will continue and give him a course of Cipro. He is also on doxycycline as an immune modulator for the pyoderma. In addition to his original wound on the left lateral leg with surrounding erythema he has a wound on the right posterior calf which was an original biopsy site done by dermatology. 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 Saw Dr. Lillia Carmel yesterday. He has been using topical antibiotics to both wound areas his original wound on the left and the biopsies/pathergy area on the right. There is definitely some improvement in the inflammation around the wound on the right although the patient states he has increasing sensitivity of the wounds. He is on prednisone 60 and doxycycline 1 as prescribed by Dr. Lillia Carmel. He is covering the topical  antibiotic with gauze and putting this in his own compression stocks and changing this daily. He states that Dr. Lottie Rater did a culture of the left leg wound yesterday 05/07/17; pyoderma gangrenosum. The patient saw Dr. Lillia Carmel yesterday and has a follow-up with her in one month. He is still using topical antibiotics to both wounds although he can't recall exactly what type. He is still on prednisone 60 mg. Dr. Lillia Carmel stated that the doxycycline could stop if we were in agreement. 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 The right medial leg was induced by biopsy/pathergy. The area on the right is essentially healed. 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 The right medial leg has closed and  remains closed. He is still on prednisone 60. oHe tells me he missed his last dermatology appointment with Dr. Lillia Carmel but will make another appointment. He reports that her blood sugar at a recent screen in [place] was high 200's. He was 180 today. He is more cushingoid blood pressure is [name], [name] ([id number]) up a bit. I think he is going to require still much longer prednisone perhaps another 3 months before attempting to taper. In the meantime his wound is a lot better. Smaller. He is cleaning this off daily and applying topical antibiotics. When he was last in the clinic I thought about changing to [facility] and actually put in a couple of calls to dermatology although probably not during their business hours. 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 He continues on oral prednisone. He has been using triple antibiotic ointment. There is surface debris and we will transition to [facility] and have him return in 2 [name]. He has lost 30 pounds since his last appointment with lifestyle modification. He may benefit from topical steroid cream for treatment this can be considered at a later date. 08/22/17 on evaluation today patient appears to actually be doing rather well in regard to his left lateral lower extremity ulcer. He has actually been managed by Dr. Dellia Nims most recently. Patient is currently on oral steroids at this time. This seems to have been of benefit for him. Nonetheless his last visit was actually with Leah on 08/06/17. Currently he is not utilizing any topical steroid creams although this could be of benefit as well. No fevers, chills, nausea, or vomiting noted at this time. 09/05/17 on evaluation today patient appears to be doing better in regard to his left lateral lower extremity ulcer. He has been tolerating the dressing changes without complication.  He is using Santyl with good effect. Overall I'm very pleased with how things are standing at this point. Patient likewise is happy that this is doing better. 09/19/17 on evaluation today patient actually appears to be doing rather well in regard to his left lateral lower extremity ulcer. 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 He's not having any significant pain. 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 He has been tolerating the Santyl without complication and in general I feel like he's making good  progress. 10/17/17 on evaluation today patient appears to be doing very well in regard to his left lateral lower surety ulcer. He has been tolerating the dressing changes without complication. There does not appear to be any evidence of infection he's alternating the Santyl and the triple antibiotic ointment every other day this seems to be doing well for him. 11/03/17 on evaluation today patient appears to be doing very well in regard to his left lateral lower extremity ulcer. He is been tolerating the dressing changes without complication which is good news. Fortunately there does not appear to be any evidence of infection which is also great news. Overall is doing excellent they are starting to taper down on the prednisone is down to 40 mg at this point it also started topical clobetasol for him. 11/17/17 on evaluation today patient appears to be doing well in regard to his left lateral lower surety ulcer. He's been tolerating the dressing changes without complication. He does note that he is having no pain, no excessive drainage or discharge, and overall he feels like things are going about how he would expect and hope they would. Overall he seems to have no evidence of infection at this time in my opinion which is  good news. 12/04/17-He is seen in follow-up evaluation for right lateral lower extremity ulcer. He has been applying topical steroid cream. Today's measurement show slight increase in size. Over the next 2 [name] we will transition to every other day Santyl and steroid cream. He has been encouraged to monitor for changes and notify clinic with any concerns 12/15/17 on evaluation today patient's left lateral motion the ulcer and fortunately is doing worse again at this point. This just since last week to this week has close to doubled in size according to the patient. I did not seeing last week's I do not have a visual to compare this to in our system was also down so we do not have all the charts and at this point. Nonetheless it does have me somewhat concerned in regard to the fact that again he was worried enough about it he has contact the dermatology that placed them back on the full strength, 50 mg a day of the prednisone that he was taken previous. He continues to alternate using clobetasol along with Santyl at this point. He is obviously somewhat frustrated. 12/22/17 on evaluation today patient appears to be doing a little worse compared to last evaluation. Unfortunately the wound is a little deeper and slightly larger than the last week's evaluation. With that being said he has made some progress in regard to the irritation surrounding at this time unfortunately despite that progress that's been made he still has a significant issue going on here. 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 For that reason I discussed with him today the possibility of perform a wound culture to ensure there's nothing overtly infected. 01/06/18 on evaluation today patient's wound is larger and deeper than previously evaluated. With that being said it did appear that  his wound was infected after my last evaluation with him. Subsequently I did end up prescribing a prescription for Bactrim DS which she has been taking and having no complication with. Fortunately there does not appear to be any evidence of [place], [name] ([id number]) infection at this point in time as far as anything spreading, no want to  touch, and overall I feel like things are showing signs of improvement. 01/13/18 on evaluation today patient appears to be even a little larger and deeper than last time. There still muscle exposed in the base of the wound. Nonetheless he does appear to be less erythematous I do believe inflammation is calming down also believe the infection looks like it's probably resolved at this time based on what I'm seeing. No fevers, chills, nausea, or vomiting noted at this time. 01/30/18 on evaluation today patient actually appears to visually look better for the most part. Unfortunately those visually this looks better he does seem to potentially have what may be an abscess in the muscle that has been noted in the central portion of the wound. This is the first time that I have noted what appears to be fluctuance in the central portion of the muscle. With that being said I'm somewhat more concerned about the fact that this might indicate an abscess formation at this location. I do believe that an ultrasound would be appropriate. This is likely something we need to try to do as soon as possible. He has been switch to mupirocin ointment and he is no longer using the steroid ointment as prescribed by dermatology he sees them again next week he's been decreased from 60 to 40 mg of prednisone. 03/09/18 on evaluation today patient actually appears to be doing a little better compared to last time I saw him. There's not as much erythema surrounding the wound itself. He I did review his most recent infectious disease note which was dated 02/24/18. He saw Dr. Michel Bickers in  [place]. 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 Patient is now off of antibiotics and they are continually observing this. There seems to be no change in the past two [name] in my pinion based on what the patient says and what I see today compared to what Dr. Megan Salon likely saw two [name] ago. No fevers, chills, nausea, or vomiting noted at this time. 03/23/18 on evaluation today patient's wound actually appears to be showing signs of improvement which is good news. He is currently still on the Dapsone. He is also working on tapering the prednisone to get off of this and Dr. Lottie Rater is working with him in this regard. Nonetheless overall I feel like the wound is doing well it does appear based on the infectious disease note that I reviewed from Dr. 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 I did also review the results of his ultrasound of left lower extremity which revealed there was a dentist tissue in the base of the wound without an abscess noted. 04/06/18 on evaluation today the patient's left lateral lower extremity ulcer actually appears to be doing fairly well which is excellent news. There does not appear to be any evidence of infection at this time which is also great news. Overall he still does have a significantly large ulceration although little by little he seems to be making progress. He is down to 10 mg a day of the prednisone. 04/20/18 on evaluation today patient actually appears to be doing excellent at this time in regard to his left lower extremity ulcer. He's making signs of good progress unfortunately this is taking much longer than we would really like  to see but nonetheless he is making progress. Fortunately there does not appear to be any evidence of infection at this  time. No fevers, chills, nausea, or vomiting noted at this time. The patient has not been using the Santyl due to the cost he hadn't got in this field yet. He's mainly been using the antibiotic ointment topically. Subsequently he also tells me that he really has not been scrubbing in the shower I think this would be helpful again as I told him it doesn't have to be anything too aggressive to even make it believe just enough to keep it free of some of the loose slough and biofilm on the wound surface. 05/11/18 on evaluation today patient's wound appears to be making slow but sure progress in regard to the left lateral lower extremity ulcer. He is been tolerating the dressing changes without complication. Fortunately there does not appear to be any evidence of infection at this time. He is still just using triple antibiotic ointment along with clobetasol occasionally over the area. He never got the Santyl and really does not seem to intend to in my pinion. 06/01/18 on evaluation today patient appears to be doing a little better in regard to his left lateral lower extremity ulcer. He states that overall he does not feel like he is doing as well with the Dapsone as he did with the prednisone. Nonetheless he sees his dermatologist later today and is gonna talk to them about the possibility of going back on the prednisone. Overall again I believe that the wound would be better if you would utilize Santyl but he really does not seem to be interested in going back to the [facility] at this point. He has been using triple antibiotic ointment. 06/15/18 on evaluation today patient's wound actually appears to be doing about the same at this point. Fortunately there is no signs of infection at this time. He has made slight improvements although he continues to not really want to clean the wound bed at this point. He states that he just doesn't mess with it he doesn't want to cause any problems with everything else  he has going on. He has been on medication, antibiotics as prescribed by his dermatologist, for a staff infection of his lower extremities which is really drying out now and looking much better he tells me. Fortunately there is no sign of overall infection. SEVRIN, SALLY ([id number]) 06/29/18 on evaluation today patient appears to be doing well in regard to his left lateral lower surety ulcer all things considering. Fortunately his staff infection seems to be greatly improved compared to previous. He has no signs of infection and this is drying up quite nicely. He is still the doxycycline for this is no longer on cental, Dapsone, or any of the other medications. His dermatologist has recommended possibility of an infusion but right now he does not want to proceed with that. 07/13/18 on evaluation today patient appears to be doing about the same in regard to his left lateral lower surety ulcer. Fortunately there's no signs of infection at this time which is great news. Unfortunately he still builds up a significant amount of Slough/biofilm of the surface of the wound he still is not really cleaning this as he should be appropriately. Again I'm able to easily with saline and gauze remove the majority of this on the surface which if you would do this at home would likely be a dramatic improvement for him as far as getting the area  to improve. Nonetheless overall I still feel like he is making progress is just very slow. I think Santyl will be of benefit for him as well. Still he has not gotten this as of this point. 07/27/18 on evaluation today patient actually appears to be doing little worse in regards of the erythema around the periwound region of the wound he also tells me that he's been having more drainage currently compared to what he was experiencing last time I saw him. He states not quite as bad as what he had because this was infected previously but nonetheless is still appears to be doing  poorly. Fortunately there is no evidence of systemic infection at this point. The patient tells me that he is not going to be able to afford the Santyl. He is still waiting to hear about the infusion therapy with his dermatologist. Apparently she wants an updated colonoscopy first. 08/10/18 on evaluation today patient appears to be doing better in regard to his left lateral lower extremity ulcer. Fortunately he is showing signs of improvement in this regard he's actually been approved for Remicade infusion's as well although this has not been scheduled as of yet. Fortunately there's no signs of active infection at this time in regard to the wound although he is having some issues with infection of the right lower extremity is been seen as dermatologist for this. Fortunately they are definitely still working with him trying to keep things under control. 09/07/18 on evaluation today patient is actually doing rather well in regard to his left lateral lower extremity ulcer. He notes these actually having some hair grow back on his extremity which is something he has not seen in years. He also tells me that the pain is really not giving them any trouble at this time which is also good news overall she is very pleased with the progress he's using a combination of the mupirocin along with the probate is all mixed. 09/21/18 on evaluation today patient actually appears to be doing fairly well all things considered in regard to his looks from the ulcer. He's been tolerating the dressing changes without complication. Fortunately there's no signs of active infection at this time which is good news he is still on all antibiotics or prevention of the staff infection. He has been on prednisone for time although he states it is gonna contact his dermatologist and see if she put them on a short course due to some irritation that he has going on currently. Fortunately there's no evidence of any overall worsening this is  going very slow I think cental would be something that would be helpful for him although he states that $50 for tube is quite expensive. He therefore is not willing to get that at this point. 10/06/18 on evaluation today patient actually appears to be doing decently well in regard to his left lateral leg ulcer. He's been tolerating the dressing changes without complication. Fortunately there's no signs of active infection at this time. Overall I'm actually rather pleased with the progress he's making although it's slow he doesn't show any signs of infection and he does seem to be making some improvement. I do believe that he may need a switch up and dressings to try to help this to heal more appropriately and quickly. 10/19/18 on evaluation today patient actually appears to be doing better in regard to his left lateral lower extremity ulcer. This is shown signs of having much less Slough buildup at this point due to  the fact he has been using the Santyl. Obviously this is very good news. The overall size of the wound is not dramatically smaller but again the appearance is. 11/02/18 on evaluation today patient actually appears to be doing quite well in regard to his lower [place] ulcer. A lot of the skin around the ulcer is actually somewhat irritating at this point this seems to be more due to the dressing causing irritation from the adhesive that anything else. Fortunately there is no signs of active infection at this time. 11/24/18 on evaluation today patient appears to be doing a little worse in regard to his overall appearance of his lower extremity ulcer. There's more erythema and warmth around the wound unfortunately. He is currently on doxycycline which he has been on for some time. With that being said I'm not sure that seems to be helping with what appears to possibly be an acute cellulitis with regard to his left lower extremity ulcer. No fevers, chills, nausea, or vomiting noted at this  time. 12/08/18 on evaluation today patient's wounds actually appears to be doing significantly better compared to his last evaluation. He has been using Santyl along with alternating tripling about appointment as well as the steroid cream seems to be doing [name], [name] ([id number]) quite well and the wound is showing signs of improvement which is excellent news. Fortunately there's no evidence of infection and in fact his culture came back negative with only normal skin flora noted. 12/21/2018 upon evaluation today patient actually appears to be doing excellent with regard to his ulcer. This is actually the best that I have seen it since have been helping to take care of him. It is both smaller as well as less slough noted on the surface of the wound and seems to be showing signs of good improvement with new skin growing from the edges. He has been using just the triamcinolone he does wonder if he can get a refill of that ointment today. 01/04/2019 upon evaluation today patient actually appears to be doing well with regard to his left lateral lower extremity ulcer. With that being said it does not appear to be that he is doing quite as well as last time as far as progression is concerned. There does not appear to be any signs of infection or significant irritation which is good news. With that being said I do believe that he may benefit from switching to a collagen based dressing based on how clean The wound appears. 01/18/2019 on evaluation today patient actually appears to be doing well with regard to his wound on the left lower extremity. He is not made a lot of progress compared to where we were previous but nonetheless does seem to be doing okay at this time which is good news. There is no signs of active infection which is also good news. My only concern currently is I do wish we can get him into utilizing the collagen dressing his insurance would not pay for the supplies that we ordered although  it appears that he may be able to order this through his supply company that he typically utilizes. This is [employer]. Nonetheless he did try to order it during the office visit today and it appears this did go through. We will see if he can get that it is a different brand but nonetheless he has collagen and I do think will be beneficial. 02/01/2019 on evaluation today patient actually appears to be doing a little worse today in regard  to the overall size of his wounds. Fortunately there is no signs of active infection at this time. That is visually. Nonetheless when this is happened before it was due to infection. For that reason were somewhat concerned about that this time as well. 02/08/2019 on evaluation today patient unfortunately appears to be doing slightly worse with regard to his wound upon evaluation today. Is measuring a little deeper and a little larger unfortunately. I am not really sure exactly what is causing this to enlarge he actually did see his dermatologist she is going to see about initiating Humira for him. Subsequently she also did do steroid injections into the wound itself in the periphery. Nonetheless still nonetheless he seems to be getting a little bit larger he is gone back to just using the steroid cream topically which I think is appropriate. I would say hold off on the collagen for the time being is definitely a good thing to do. Based on the culture results which we finally did get the final result back regarding it shows staph as the bacteria noted again that can be a normal skin bacteria based on the fact however he is having increased drainage and worsening of the wound measurement wise I would go ahead and place him on an antibiotic today I do believe for this. Electronic Signature(s) Signed: 02/08/2019 9:09:05 AM By: [name] Keeler PA-C Entered By: [name] on 02/08/2019 09:09:05 YADIR, ZENTNER  ([id number]) -------------------------------------------------------------------------------- Physical Exam Details Patient Name: [name] Date of Service: 02/08/2019 8:15 AM Medical Record Number: [id number] Patient Account Number: [id number] Date of Birth/Sex: 07/07/1980 (38 y.o. M) Treating RN: [name] Primary Care Provider: Alma [name] Other Clinician: Referring Provider: Alma [name] Treating Provider/Extender: [name], Lillard Bailon [name] in Treatment: 69 Constitutional Well-nourished and well-hydrated in no acute distress. Respiratory normal breathing without difficulty. clear to auscultation bilaterally. Cardiovascular regular rate and rhythm with normal S1, S2. Psychiatric this patient is able to make decisions and demonstrates good insight into disease process. Alert and Oriented x 3. pleasant and cooperative. Notes Patient's wound bed currently showed signs of still fairly good granulation all things considering. Nonetheless it did not appear to be as healthy as it was a few [name] back unfortunately. I still think that the triamcinolone is beneficial for him and probably is the best thing to do at this time considering the fact that the wound seems to be making a turn more towards the larger side wound it had been doing excellent. I do not feel the switch to the collagen dressing really had any effect on this per se although it may have been just stopping the steroid might of made a difference or to be perfectly honest it might of been just a matter of fact that the patient coincidentally developed an infection which did show Staphylococcus aureus on the culture we finally got back today that is causing the issue. Electronic Signature(s) Signed: 02/08/2019 9:10:37 AM By: [name] Keeler PA-C Entered By: [name] on 02/08/2019 09:10:37 GARION, WEMPE ([id number]) -------------------------------------------------------------------------------- Physician  Orders Details Patient Name: [name] Date of Service: 02/08/2019 8:15 AM Medical Record Number: [id number] Patient Account Number: [id number] Date of Birth/Sex: [birth year] (38 y.o. 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 Anesthetic (add to Medication List) Wound #1 Left,Lateral Lower Leg o Topical Lidocaine 4% cream applied to wound bed prior to debridement (In Clinic Only). Primary Wound Dressing Wound #1 Left,Lateral Lower Leg o Other: - TCA to wound bed only Secondary Dressing Wound #1 Left,Lateral Lower Leg o Boardered Foam Dressing Dressing Change Frequency Wound #1 Left,Lateral Lower Leg o Change dressing every other day. Follow-up Appointments Wound #1 Left,Lateral Lower Leg o Return Appointment in 1 week. Edema Control Wound #1 Left,Lateral Lower Leg o Elevate legs to the level of the heart and pump ankles as often as possible Patient Medications Allergies: milk, Biaxin, seasonal Notifications Medication Indication Start End Augmentin 02/08/2019 DOSE 1 - oral 875 mg-125 mg tablet - 1 tablet oral taken 2 times a day for 14 days Legendre, Reznor ([id number]) Notes Augmentin 14 days Electronic Signature(s) Signed: 02/08/2019 9:12:26 AM By: [name] Keeler PA-C Entered By: [name] on 02/08/2019 09:12:26 [name] ([id number]) -------------------------------------------------------------------------------- Problem List Details Patient Name: [name] Date of Service: 02/08/2019 8:15  AM Medical Record Number: [id number] Patient Account Number: [id number] Date of Birth/Sex: 03-Sep-1980 (38 y.o. 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 M) Treating RN: [name] Primary Care Provider: Alma [name] Other Clinician: Referring Provider: Alma [name] Treating Provider/Extender: [name], Theresa Dohrman [name] in Treatment: 113 Subjective Chief Complaint Information obtained from Patient He is here in follow up evaluation for LLE pyoderma ulcer History of Present Illness (HPI) 12/04/16; 38 year old man who comes into the clinic today for review of a wound on the posterior left calf. He tells me that is been there for about a year. He is not a diabetic he does smoke half a pack per day. He was seen in the ER on 11/20/16 felt to have cellulitis around the wound and was given clindamycin. An x-ray did not show osteomyelitis. The patient  initially tells me that he has a milk allergy that sets off a pruritic itching rash on his lower legs which she scratches incessantly and he thinks that's what may have set up the wound. He has been using various topical antibiotics and ointments without any effect. He works in a [employer]ing [employer] and is on his feet all day. He does not have a prior history of wounds however he does have the rash on both lower legs the right arm and the ventral aspect of his left arm. These are excoriations and clearly have had scratching however there are of macular looking areas on both legs including a substantial larger area on the right leg. This does not have an underlying open area. There is no blistering. The patient tells me  that 2 years ago in [place] in response to the rash on his legs he saw a dermatologist who told him he had a condition which may be pyoderma gangrenosum although I may be putting words into his mouth. He seemed to recognize this. 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 He is not in any treatment for this. He's had no recent travel 12/11/16; the patient arrives today with his wound and roughly the same condition we've been using silver alginate this is a deep punched out wound with some surrounding erythema but no tenderness. Biopsy I did did not show confirmed pyoderma gangrenosum suggested nonspecific inflammation and vasculitis but does not provide an actual description of what was seen by the pathologist. I'm really not able to understand this We have also received information from the patient's dermatologist in [place] notes from April 2016. This was a doctor Agarwal- antal. 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 He was prescribed topical steroid high potency under occlusion which helped but at this point the patient did not have a deep punched out wound. 12/18/16; the patient's wound is larger in terms of surface area however  this surface looks better and there is less depth. The surrounding erythema also is better. The patient states that the wrap we put on came off 2 days ago when he has been using his compression stockings. He we are in the process of getting a dermatology consult. 12/26/16 on evaluation today patient's left lower extremity wound shows evidence of infection with surrounding erythema noted. He has been tolerating the dressing changes but states that he has noted more discomfort. There is a larger area of erythema surrounding the wound. No fevers, chills, nausea, or vomiting noted at this time. With that being said the wound still does have slough covering the surface. He is not allergic to any medication that he is aware of at this point. In regard to his right lower extremity he had several regions that are erythematous and pruritic he wonders if there's anything we can do to help that. 01/02/17 I reviewed patient's wound culture which was obtained his visit last week. He was placed on doxycycline at that point. Unfortunately that does not appear to be an antibiotic that would likely help with the situation however the pseudomonas noted on culture is sensitive to Cipro. Also unfortunately patient's wound seems to have a large compared to last week's evaluation. Not severely so but there are definitely increased measurements in general. He is continuing to have discomfort as well he writes this to be a seven out of 10. In fact he would prefer me not to perform any debridement today due to the fact that he is having discomfort and considering he has an active infection on the little reluctant to do so anyway. No fevers, [name], [name] ([id number]) chills, nausea, or vomiting noted at this time. 01/08/17; patient seems dermatology on September 5. I suspect dermatology will want the slides from the biopsy I did sent to their pathologist. I'm not sure if there is a way we can expedite that. In any case the  culture I did before I left on vacation 3 [name] ago showed Pseudomonas he was given 10 days of Cipro and per her description of her intake nurses is actually somewhat better this week although the wound is quite a bit bigger than I remember the last time I saw this. He still has 3 more days of Cipro 01/21/17;  dermatology appointment tomorrow. He has completed the ciprofloxacin for Pseudomonas. Surface of the wound looks better however he is had some deterioration in the lesions on his right leg. Meantime the left lateral leg wound we will continue with sample 01/29/17; patient had his dermatology appointment but I can't yet see that note. He is completed his antibiotics. The wound is more superficial but considerably larger in circumferential area than when he came in. This is in his left lateral calf. He also has swollen erythematous areas with superficial wounds on the right leg and small papular areas on both arms. There apparently areas in her his upper thighs and buttocks I did not look at those. Dermatology biopsied the right leg. Hopefully will have their input next week. 02/05/17; patient went back to see his dermatologist who told him that he had a "scratching problem" as well as staph. He is now on a 30 day course of doxycycline and I believe she gave him triamcinolone cream to the right leg areas to help with the itching [not exactly sure but probably triamcinolone]. She apparently looked at the left lateral leg wound although this was not rebiopsied and I think felt to be ultimately part of the same pathogenesis. He is using sample border foam and changing nevus himself. He now has a new open area on the right posterior leg which was his biopsy site I don't have any of the dermatology notes 02/12/17; we put the patient in compression last week with SANTYL to the wound on the left leg and the biopsy. Edema is much better and the depth of the wound is now at level of skin. Area is still the  same Biopsy site on the right lateral leg we've also been using santyl with a border foam dressing and he is changing this himself. 02/19/17; Using silver alginate started last week to both the substantial left leg wound and the biopsy site on the right wound. He is tolerating compression well. Has a an appointment with his primary M.D. tomorrow wondering about diuretics although I'm wondering if the edema problem is actually lymphedema 02/26/17; the patient has been to see his primary doctor Dr. Jerrel Ivory at [facility] our primary care. She started him on Lasix 20 mg and this seems to have helped with the edema. However we are not making substantial change with the left lateral calf wound and inflammation. The biopsy site on the right leg also looks stable but not really all that different. 03/12/17; the patient has been to see vein and vascular Dr. Lucky Cowboy. He has had venous reflux studies I have not reviewed these. I did get a call from his dermatology office. 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 They therefore recommended clobetasol and tetracycline i.e. 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 03/26/17; apparently the patient just had reflux studies not an appointment with Dr. dew. She arrives in clinic today having applied clobetasol for 2-3 [name]. He notes over the last 2-3 days excessive drainage having to change the dressing 3-4 times a day and also expanding erythema. He states the expanding erythema seems to come and go and was last this red was earlier in the month.he is on doxycycline 150 mg twice a  day as an anti-inflammatory systemic therapy for possible pyoderma gangrenosum along with the topical  clobetasol 04/02/17; the patient was seen last week by Dr. Lillia Carmel at [facility] dermatology locally who kindly saw him at my request. 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 My concern was the degree of erythema medially extending from his left leg wound which was either inflammation from pyoderma or cellulitis. I put him on Augmentin however culture of the wound showed Pseudomonas which is quinolone sensitive. I really don't believe he has cellulitis however in view of everything I will continue and give him a course of Cipro. He is also on doxycycline as an immune modulator for the pyoderma. In addition to his original wound on the left lateral leg with surrounding erythema he has a wound on the right posterior calf which was an original biopsy site done by dermatology. 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 Saw Dr. Lillia Carmel yesterday. He has been using topical antibiotics to both wound areas his original wound on the left and the biopsies/pathergy area on the right. There is definitely some improvement in the inflammation around the wound on the right although the patient states he has increasing sensitivity of the wounds. He is on prednisone 60 and doxycycline 1 as prescribed by Dr. Lillia Carmel. He is covering the topical antibiotic with gauze and putting this in his own compression stocks and changing this daily. He states that Dr. Lottie Rater did a culture of the left leg wound yesterday 05/07/17; pyoderma gangrenosum. The patient saw Dr. Lillia Carmel yesterday and has a follow-up with her in one month. He is still using topical antibiotics to both wounds although he can't recall exactly what type. He is still on prednisone 60 mg. Dr. Lillia Carmel stated that the doxycycline could stop if we were in agreement. 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 The right medial leg was induced by [name] ([id number]) biopsy/pathergy. The area on the right is essentially healed. 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 The right medial leg has closed and remains closed. He is still on prednisone 60. He tells me he missed his last dermatology appointment with Dr. Lillia Carmel but will make another appointment. He reports that her blood sugar at a recent screen in [place] was high 200's. He was 180 today. 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 I think he is going to require still much longer prednisone perhaps another 3 months before attempting to taper. In the meantime his wound is a lot better. Smaller. He is cleaning this off daily and applying topical antibiotics. When he was last in the clinic I thought about changing to [facility] and actually put in a couple of calls to dermatology although probably not during their business hours. 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 He continues on oral prednisone. He has been using triple antibiotic ointment. There is surface debris and we will transition to [facility] and have him return in 2 [name]. He has lost 30 pounds since his last appointment with lifestyle modification. He may benefit from topical steroid cream for treatment this  can be considered at a later date. 08/22/17 on evaluation today patient appears to actually be doing rather well in regard to his left lateral lower extremity ulcer. He has actually been managed by Dr. Dellia Nims most recently. Patient is currently on oral steroids at this time. This seems to have been of benefit for him. Nonetheless his last visit was actually with Leah on  08/06/17. Currently he is not utilizing any topical steroid creams although this could be of benefit as well. No fevers, chills, nausea, or vomiting noted at this time. 09/05/17 on evaluation today patient appears to be doing better in regard to his left lateral lower extremity ulcer. He has been tolerating the dressing changes without complication. He is using Santyl with good effect. Overall I'm very pleased with how things are standing at this point. Patient likewise is happy that this is doing better. 09/19/17 on evaluation today patient actually appears to be doing rather well in regard to his left lateral lower extremity ulcer. 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 He's not having any significant pain. 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 He has been tolerating the Santyl without complication and in general I feel like he's making good progress. 10/17/17 on evaluation today patient appears to be doing very well in regard to his left lateral lower surety ulcer. He has been tolerating the dressing changes without complication. There does not appear to be any evidence of infection he's alternating the Santyl and the triple antibiotic ointment every other day this seems to be doing well for him. 11/03/17 on evaluation today patient appears to be doing very well in regard to his left lateral lower extremity ulcer. He is been tolerating the dressing changes without complication which is good news. Fortunately there does not appear to be any evidence of infection which is also great news. Overall is doing excellent they are starting to taper down on the prednisone is down to 40 mg at this point it also started topical clobetasol for him. 11/17/17 on evaluation today patient appears to be doing well in regard to  his left lateral lower surety ulcer. He's been tolerating the dressing changes without complication. He does note that he is having no pain, no excessive drainage or discharge, and overall he feels like things are going about how he would expect and hope they would. Overall he seems to have no evidence of infection at this time in my opinion which is good news. 12/04/17-He is seen in follow-up evaluation for right lateral lower extremity ulcer. He has been applying topical steroid cream. Today's measurement show slight increase in size. Over the next 2 [name] we will transition to every other day Santyl and steroid cream. He has been encouraged to monitor for changes and notify clinic with any concerns 12/15/17 on evaluation today patient's left lateral motion the ulcer and fortunately is doing worse again at this point. This just since last week to this week has close to doubled in size according to the patient. I did not seeing last week's I do not have a visual to compare this to in our system was also down so we do not have all the charts and at this point. Nonetheless it does have me somewhat concerned in regard to the fact that again he was worried enough about  it he has contact the dermatology that placed them back on the full strength, 50 mg a day of the prednisone that he was taken previous. He continues to alternate using clobetasol along with Santyl at this point. He is obviously somewhat frustrated. 12/22/17 on evaluation today patient appears to be doing a little worse compared to last evaluation. Unfortunately the wound is a little deeper and slightly larger than the last week's evaluation. With that being said he has made some progress in regard to the irritation surrounding at this time unfortunately despite that progress that's been made he still has a significant issue going on here. I'm not certain that he is having really any true infection at this time although with the Pyoderma  gangrenosum it can [name], [name] ([id number]) sometimes be difficult to differentiate infection versus just inflammation. For that reason I discussed with him today the possibility of perform a wound culture to ensure there's nothing overtly infected. 01/06/18 on evaluation today patient's wound is larger and deeper than previously evaluated. With that being said it did appear that his wound was infected after my last evaluation with him. Subsequently I did end up prescribing a prescription for Bactrim DS which she has been taking and having no complication with. Fortunately there does not appear to be any evidence of infection at this point in time as far as anything spreading, no want to touch, and overall I feel like things are showing signs of improvement. 01/13/18 on evaluation today patient appears to be even a little larger and deeper than last time. There still muscle exposed in the base of the wound. Nonetheless he does appear to be less erythematous I do believe inflammation is calming down also believe the infection looks like it's probably resolved at this time based on what I'm seeing. No fevers, chills, nausea, or vomiting noted at this time. 01/30/18 on evaluation today patient actually appears to visually look better for the most part. Unfortunately those visually this looks better he does seem to potentially have what may be an abscess in the muscle that has been noted in the central portion of the wound. This is the first time that I have noted what appears to be fluctuance in the central portion of the muscle. With that being said I'm somewhat more concerned about the fact that this might indicate an abscess formation at this location. I do believe that an ultrasound would be appropriate. This is likely something we need to try to do as soon as possible. He has been switch to mupirocin ointment and he is no longer using the steroid ointment as prescribed by dermatology he sees them  again next week he's been decreased from 60 to 40 mg of prednisone. 03/09/18 on evaluation today patient actually appears to be doing a little better compared to last time I saw him. There's not as much erythema surrounding the wound itself. He I did review his most recent infectious disease note which was dated 02/24/18. He saw Dr. Michel Bickers in [place]. 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 Patient is now off of antibiotics and they are continually observing this. There seems to be no change in the past two [name] in my pinion based on what the patient says and what I see today compared to what Dr. Megan Salon likely saw two [name] ago. No fevers, chills, nausea, or vomiting noted at this time.  03/23/18 on evaluation today patient's wound actually appears to be showing signs of improvement which is good news. He is currently still on the Dapsone. He is also working on tapering the prednisone to get off of this and Dr. Lottie Rater is working with him in this regard. Nonetheless overall I feel like the wound is doing well it does appear based on the infectious disease note that I reviewed from Dr. 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 I did also review the results of his ultrasound of left lower extremity which revealed there was a dentist tissue in the base of the wound without an abscess noted. 04/06/18 on evaluation today the patient's left lateral lower extremity ulcer actually appears to be doing fairly well which is excellent news. There does not appear to be any evidence of infection at this time which is also great news. Overall he still does have a significantly large ulceration although little by little he seems to be making progress. He is down to 10 mg a day of the  prednisone. 04/20/18 on evaluation today patient actually appears to be doing excellent at this time in regard to his left lower extremity ulcer. He's making signs of good progress unfortunately this is taking much longer than we would really like to see but nonetheless he is making progress. Fortunately there does not appear to be any evidence of infection at this time. No fevers, chills, nausea, or vomiting noted at this time. The patient has not been using the Santyl due to the cost he hadn't got in this field yet. He's mainly been using the antibiotic ointment topically. Subsequently he also tells me that he really has not been scrubbing in the shower I think this would be helpful again as I told him it doesn't have to be anything too aggressive to even make it believe just enough to keep it free of some of the loose slough and biofilm on the wound surface. 05/11/18 on evaluation today patient's wound appears to be making slow but sure progress in regard to the left lateral lower extremity ulcer. He is been tolerating the dressing changes without complication. Fortunately there does not appear to be any evidence of infection at this time. He is still just using triple antibiotic ointment along with clobetasol occasionally over the area. He never got the Santyl and really does not seem to intend to in my pinion. 06/01/18 on evaluation today patient appears to be doing a little better in regard to his left lateral lower extremity ulcer. He states that overall he does not feel like he is doing as well with the Dapsone as he did with the prednisone. Nonetheless he sees his dermatologist later today and is gonna talk to them about the possibility of going back on the prednisone. Overall again I believe that the wound would be better if you would utilize Santyl but he really does not seem to be interested in going back to the [place] at this point. He has been using triple antibiotic ointment. DIERRE, CREVIER ([id number]) 06/15/18 on evaluation today patient's wound actually appears to be doing about the same at this point. Fortunately there is no signs of infection at this time. He has made slight improvements although he continues to not really want to clean the wound bed at this point. He states that he just doesn't mess with it he  doesn't want to cause any problems with everything else he has going on. He has been on medication, antibiotics as prescribed by his dermatologist, for a staff infection of his lower extremities which is really drying out now and looking much better he tells me. Fortunately there is no sign of overall infection. 06/29/18 on evaluation today patient appears to be doing well in regard to his left lateral lower surety ulcer all things considering. Fortunately his staff infection seems to be greatly improved compared to previous. He has no signs of infection and this is drying up quite nicely. He is still the doxycycline for this is no longer on cental, Dapsone, or any of the other medications. His dermatologist has recommended possibility of an infusion but right now he does not want to proceed with that. 07/13/18 on evaluation today patient appears to be doing about the same in regard to his left lateral lower surety ulcer. Fortunately there's no signs of infection at this time which is great news. Unfortunately he still builds up a significant amount of Slough/biofilm of the surface of the wound he still is not really cleaning this as he should be appropriately. Again I'm able to easily with saline and gauze remove the majority of this on the surface which if you would do this at home would likely be a dramatic improvement for him as far as getting the area to improve. Nonetheless overall I still feel like he is making progress is just very slow. I think Santyl will be of benefit for him as well. Still he has not gotten this as of this point. 07/27/18 on evaluation today  patient actually appears to be doing little worse in regards of the erythema around the periwound region of the wound he also tells me that he's been having more drainage currently compared to what he was experiencing last time I saw him. He states not quite as bad as what he had because this was infected previously but nonetheless is still appears to be doing poorly. Fortunately there is no evidence of systemic infection at this point. The patient tells me that he is not going to be able to afford the Santyl. He is still waiting to hear about the infusion therapy with his dermatologist. Apparently she wants an updated colonoscopy first. 08/10/18 on evaluation today patient appears to be doing better in regard to his left lateral lower extremity ulcer. Fortunately he is showing signs of improvement in this regard he's actually been approved for Remicade infusion's as well although this has not been scheduled as of yet. Fortunately there's no signs of active infection at this time in regard to the wound although he is having some issues with infection of the right lower extremity is been seen as dermatologist for this. Fortunately they are definitely still working with him trying to keep things under control. 09/07/18 on evaluation today patient is actually doing rather well in regard to his left lateral lower extremity ulcer. He notes these actually having some hair grow back on his extremity which is something he has not seen in years. He also tells me that the pain is really not giving them any trouble at this time which is also good news overall she is very pleased with the progress he's using a combination of the mupirocin along with the probate is all mixed. 09/21/18 on evaluation today patient actually appears to be doing fairly well all things considered in regard to his looks from the ulcer. He's been tolerating  the dressing changes without complication. Fortunately there's no signs of active  infection at this time which is good news he is still on all antibiotics or prevention of the staff infection. He has been on prednisone for time although he states it is gonna contact his dermatologist and see if she put them on a short course due to some irritation that he has going on currently. Fortunately there's no evidence of any overall worsening this is going very slow I think cental would be something that would be helpful for him although he states that $50 for tube is quite expensive. He therefore is not willing to get that at this point. 10/06/18 on evaluation today patient actually appears to be doing decently well in regard to his left lateral leg ulcer. He's been tolerating the dressing changes without complication. Fortunately there's no signs of active infection at this time. Overall I'm actually rather pleased with the progress he's making although it's slow he doesn't show any signs of infection and he does seem to be making some improvement. I do believe that he may need a switch up and dressings to try to help this to heal more appropriately and quickly. 10/19/18 on evaluation today patient actually appears to be doing better in regard to his left lateral lower extremity ulcer. This is shown signs of having much less Slough buildup at this point due to the fact he has been using the [employer]. Obviously this is very good news. The overall size of the wound is not dramatically smaller but again the appearance is. 11/02/18 on evaluation today patient actually appears to be doing quite well in regard to his lower [place] ulcer. A lot of the skin around the ulcer is actually somewhat irritating at this point this seems to be more due to the dressing causing irritation from the adhesive that anything else. Fortunately there is no signs of active infection at this time. 11/24/18 on evaluation today patient appears to be doing a little worse in regard to his overall appearance of his lower  extremity [name], [name] ([id number]) ulcer. There's more erythema and warmth around the wound unfortunately. He is currently on doxycycline which he has been on for some time. With that being said I'm not sure that seems to be helping with what appears to possibly be an acute cellulitis with regard to his left lower extremity ulcer. No fevers, chills, nausea, or vomiting noted at this time. 12/08/18 on evaluation today patient's wounds actually appears to be doing significantly better compared to his last evaluation. He has been using Santyl along with alternating tripling about appointment as well as the steroid cream seems to be doing quite well and the wound is showing signs of improvement which is excellent news. Fortunately there's no evidence of infection and in fact his culture came back negative with only normal skin flora noted. 12/21/2018 upon evaluation today patient actually appears to be doing excellent with regard to his ulcer. This is actually the best that I have seen it since have been helping to take care of him. It is both smaller as well as less slough noted on the surface of the wound and seems to be showing signs of good improvement with new skin growing from the edges. He has been using just the triamcinolone he does wonder if he can get a refill of that ointment today. 01/04/2019 upon evaluation today patient actually appears to be doing well with regard to his left lateral lower extremity ulcer. With that  being said it does not appear to be that he is doing quite as well as last time as far as progression is concerned. There does not appear to be any signs of infection or significant irritation which is good news. With that being said I do believe that he may benefit from switching to a collagen based dressing based on how clean The wound appears. 01/18/2019 on evaluation today patient actually appears to be doing well with regard to his wound on the left lower extremity. He is  not made a lot of progress compared to where we were previous but nonetheless does seem to be doing okay at this time which is good news. There is no signs of active infection which is also good news. My only concern currently is I do wish we can get him into utilizing the collagen dressing his insurance would not pay for the supplies that we ordered although it appears that he may be able to order this through his supply company that he typically utilizes. This is [employer]. Nonetheless he did try to order it during the office visit today and it appears this did go through. We will see if he can get that it is a different brand but nonetheless he has collagen and I do think will be beneficial. 02/01/2019 on evaluation today patient actually appears to be doing a little worse today in regard to the overall size of his wounds. Fortunately there is no signs of active infection at this time. That is visually. Nonetheless when this is happened before it was due to infection. For that reason were somewhat concerned about that this time as well. 02/08/2019 on evaluation today patient unfortunately appears to be doing slightly worse with regard to his wound upon evaluation today. Is measuring a little deeper and a little larger unfortunately. I am not really sure exactly what is causing this to enlarge he actually did see his dermatologist she is going to see about initiating Humira for him. Subsequently she also did do steroid injections into the wound itself in the periphery. Nonetheless still nonetheless he seems to be getting a little bit larger he is gone back to just using the steroid cream topically which I think is appropriate. I would say hold off on the collagen for the time being is definitely a good thing to do. Based on the culture results which we finally did get the final result back regarding it shows staph as the bacteria noted again that can be a normal skin bacteria based on the fact however  he is having increased drainage and worsening of the wound measurement wise I would go ahead and place him on an antibiotic today I do believe for this. Patient History Information obtained from Patient. Family History Diabetes - Mother,Maternal Grandparents, Heart Disease - Mother,Maternal Grandparents, Hereditary Spherocytosis - Siblings, Hypertension - Maternal Grandparents, Stroke - Siblings, No family history of Cancer, Kidney Disease, Lung Disease, Seizures, Thyroid Problems, Tuberculosis. Social History Current some day smoker, Marital Status - Married, Alcohol Use - Rarely, Drug Use - No History, Caffeine Use - Moderate. Medical History Eyes Denies history of Cataracts, Glaucoma, Optic Neuritis Ear/Nose/Mouth/Throat Denies history of Chronic sinus problems/congestion, Middle ear problems Hematologic/Lymphatic Denies history of Anemia, Hemophilia, Human Immunodeficiency Virus, Lymphedema, Sickle Cell Disease [name], [name] ([id number]) Respiratory Patient has history of Sleep Apnea - cpap Denies history of Aspiration, Asthma, Chronic Obstructive Pulmonary Disease (COPD), Pneumothorax Cardiovascular Patient has history of Hypertension Denies history of Angina, Arrhythmia, Congestive Heart Failure,  Coronary Artery Disease, Deep Vein Thrombosis, Hypotension, Myocardial Infarction, Peripheral Arterial Disease, Peripheral Venous Disease, Phlebitis, Vasculitis Gastrointestinal Patient has history of Colitis Endocrine Denies history of Type I Diabetes, Type II Diabetes Genitourinary Denies history of End Stage Renal Disease Immunological Denies history of Lupus Erythematosus, Raynaud s, Scleroderma Integumentary (Skin) Denies history of History of Burn, History of pressure wounds Musculoskeletal Denies history of Gout, Rheumatoid Arthritis, Osteoarthritis, Osteomyelitis Neurologic Denies history of Dementia, Neuropathy, Quadriplegia, Paraplegia, Seizure  Disorder Oncologic Denies history of Received Chemotherapy, Received Radiation Psychiatric Denies history of Anorexia/bulimia, Confinement Anxiety Review of Systems (ROS) Constitutional Symptoms (General Health) Denies complaints or symptoms of Fatigue, Fever, Chills, Marked Weight Change. Respiratory Denies complaints or symptoms of Chronic or frequent coughs, Shortness of Breath. Cardiovascular Denies complaints or symptoms of Chest pain, LE edema. Psychiatric Denies complaints or symptoms of Anxiety, Claustrophobia. Objective Constitutional Well-nourished and well-hydrated in no acute distress. Vitals Time Taken: 8:15 AM, Height: 71 in, Weight: 338 lbs, BMI: 47.1, Temperature: 98.5 F, Pulse: 69 bpm, Respiratory Rate: 16 breaths/min, Blood Pressure: 150/83 mmHg. Respiratory normal breathing without difficulty. clear to auscultation bilaterally. Cardiovascular regular rate and rhythm with normal S1, S2. [name], [name] ([id number]) Psychiatric this patient is able to make decisions and demonstrates good insight into disease process. Alert and Oriented x 3. pleasant and cooperative. General Notes: Patient's wound bed currently showed signs of still fairly good granulation all things considering. Nonetheless it did not appear to be as healthy as it was a few [name] back unfortunately. I still think that the triamcinolone is beneficial for him and probably is the best thing to do at this time considering the fact that the wound seems to be making a turn more towards the larger side wound it had been doing excellent. I do not feel the switch to the collagen dressing really had any effect on this per se although it may have been just stopping the steroid might of made a difference or to be perfectly honest it might of been just a matter of fact that the patient coincidentally developed an infection which did show Staphylococcus aureus on the culture we finally got back today that is  causing the issue. Integumentary (Hair, Skin) Wound #1 status is Open. Original cause of wound was Gradually Appeared. The wound is located on the Left,Lateral Lower Leg. The wound measures 3.6cm length x 2.5cm width x 0.3cm depth; 7.069cm^2 area and 2.121cm^3 volume. There is Fat Layer (Subcutaneous Tissue) Exposed exposed. There is no tunneling or undermining noted. There is a medium amount of serous drainage noted. The wound margin is distinct with the outline attached to the wound base. There is small (1-33%) red granulation within the wound bed. There is a large (67-100%) amount of necrotic tissue within the wound bed including Adherent Slough. 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 Anesthetic (add to Medication List): Wound #1 Left,Lateral Lower Leg: Topical Lidocaine 4% cream applied to wound bed prior to debridement (In Clinic Only). Primary Wound Dressing: Wound #1 Left,Lateral Lower Leg: Other: - TCA to wound bed only Secondary Dressing: Wound #1 Left,Lateral Lower Leg: Boardered Foam Dressing Dressing Change Frequency: Wound #1 Left,Lateral Lower Leg: Change dressing every other day. Follow-up Appointments: Wound #1 Left,Lateral Lower Leg: Return Appointment in 1 week. [name], [name] ([id number]) Edema Control: Wound #1 Left,Lateral Lower Leg: Elevate legs to the  level of the heart and pump ankles as often as possible The following medication(s) was prescribed: Augmentin oral 875 mg-125 mg tablet 1 1 tablet oral taken 2 times a day for 14 days starting 02/08/2019 General Notes: Augmentin 14 days 1 go ahead and send in a prescription for Augmentin for the patient he is taken this before without complication this is good news. 2. I am also going to suggest  currently that we go ahead and just switch back to utilizing the triamcinolone as opposed to collagen he is in agreement with that plan my hope is that this will keep things under control from the standpoint of infection as well. Preventing the wound from getting any larger. 3. Patient will also continue to clean the area with mild soap and water including down to bacterial soap which I think is absolutely appropriate and elevate his legs as much as possible. 4. Patient is good to be having treatment with Humira by his dermatologist but I am not sure when that will actually start. We will see patient back for reevaluation in 1 week here in the clinic. If anything worsens or changes patient will contact our office for additional recommendations. Electronic Signature(s) Signed: 02/08/2019 9:12:35 AM By: [name] Keeler PA-C Entered By: [name] on 02/08/2019 09:12:35 [name], [name] ([id number]) -------------------------------------------------------------------------------- ROS/PFSH Details Patient Name: [name] Date of Service: 02/08/2019 8:15 AM Medical Record Number: [id number] Patient Account Number: [id number] Date of Birth/Sex: 1980-10-22 (38 y.o. M) Treating RN: [name] Primary Care Provider: Alma [name] Other Clinician: Referring Provider: Alma [name] Treating Provider/Extender: [name], Rami Budhu [name] in Treatment: 113 Information Obtained From Patient Constitutional Symptoms (General Health) Complaints and Symptoms: Negative for: Fatigue; Fever; Chills; Marked Weight Change Respiratory Complaints and Symptoms: Negative for: Chronic or frequent coughs; Shortness of Breath Medical History: Positive for: Sleep Apnea - cpap Negative for: Aspiration; Asthma; Chronic Obstructive Pulmonary Disease (COPD); Pneumothorax Cardiovascular Complaints and Symptoms: Negative for: Chest pain; LE edema Medical History: Positive for: Hypertension Negative for:  Angina; Arrhythmia; Congestive Heart Failure; Coronary Artery Disease; Deep Vein Thrombosis; Hypotension; Myocardial Infarction; Peripheral Arterial Disease; Peripheral Venous Disease; Phlebitis; Vasculitis Psychiatric Complaints and Symptoms: Negative for: Anxiety; Claustrophobia Medical History: Negative for: Anorexia/bulimia; Confinement Anxiety Eyes Medical History: Negative for: Cataracts; Glaucoma; Optic Neuritis Ear/Nose/Mouth/Throat Medical History: Negative for: Chronic sinus problems/congestion; Middle ear problems Hematologic/Lymphatic Medical History: Negative for: Anemia; Hemophilia; Human Immunodeficiency Virus; Lymphedema; Sickle Cell Disease Dieu, Mariusz ([id number]) Gastrointestinal Medical History: Positive for: Colitis Endocrine Medical History: Negative for: Type I Diabetes; Type II Diabetes Genitourinary Medical History: Negative for: End Stage Renal Disease Immunological Medical History: Negative for: Lupus Erythematosus; Raynaudos; Scleroderma Integumentary (Skin) Medical History: Negative for: History of Burn; History of pressure wounds Musculoskeletal Medical History: Negative for: Gout; Rheumatoid Arthritis; Osteoarthritis; Osteomyelitis Neurologic Medical History: Negative for: Dementia; Neuropathy; Quadriplegia; Paraplegia; Seizure Disorder Oncologic Medical History: Negative for: Received Chemotherapy; Received Radiation Immunizations Pneumococcal Vaccine: Received Pneumococcal Vaccination: No Implantable Devices No devices added Family and Social History Cancer: No; Diabetes: Yes - Mother,Maternal Grandparents; Heart Disease: Yes - Mother,Maternal Grandparents; Hereditary Spherocytosis: Yes - Siblings; Hypertension: Yes - Maternal Grandparents; Kidney Disease: No; Lung Disease: No; Seizures: No; Stroke: Yes - Siblings; Thyroid Problems: No; Tuberculosis: No; Current some day smoker; Marital Status - Married; Alcohol Use: Rarely; Drug  Use: No History; Caffeine Use: Moderate; Financial Concerns: No; Food, Clothing or Shelter Needs: No; Support System Lacking: No; Transportation Concerns: No Physician Affirmation I have reviewed and agree with the above information.  Electronic Signature(s) Signed: 02/08/2019 4:36:39 PM By: Kathreen Cosier ([id number]) Signed: 02/08/2019 4:43:35 PM By: [name] Keeler PA-C Entered By: [name] on 02/08/2019 09:09:28 [name], [name] ([id number]) -------------------------------------------------------------------------------- SuperBill Details Patient Name: [name] Date of Service: 02/08/2019 Medical Record Number: [id number] Patient Account Number: [id number] Date of Birth/Sex: 1980-09-26 (38 y.o. M) Treating RN: [name] Primary Care Provider: Alma [name] Other Clinician: Referring Provider: Alma [name] Treating Provider/Extender: [name], Montrel Donahoe [name] in Treatment: 113 Diagnosis Coding ICD-10 Codes Code Description [phone] Non-pressure chronic ulcer of left calf with fat layer exposed L88 Pyoderma gangrenosum I87.2 Venous insufficiency (chronic) (peripheral) Facility Procedures CPT4 Code: 49675916 Description: 38466 - WOUND CARE VISIT-LEV 3 EST PT Modifier: Quantity: 1 Physician Procedures CPT4 Code: 5993570 Description: 17793 - WC PHYS LEVEL 4 - EST PT ICD-10 Diagnosis Description L97.222 Non-pressure chronic ulcer of left calf with fat layer expo L88 Pyoderma gangrenosum I87.2 Venous insufficiency (chronic) (peripheral) Modifier: sed Quantity: 1 Electronic Signature(s) Signed: 02/08/2019 9:12:47 AM By: [name] Keeler PA-C Entered By: [name] on 02/08/2019 09:12:47

## 2019-02-09 IMAGING — US US EXTREM LOW*L* LIMITED
1 series · 14 of 25 positions shown · non-contrast
Comparison: None.

CLINICAL DATA: Nonhealing wound.  Evaluate for abscess

EXAM:
ULTRASOUND LEFT LOWER EXTREMITY LIMITED
TECHNIQUE: Ultrasound examination of the lower extremity soft tissues was
performed in the area of clinical concern.

[Series 1: us extrem low*left* limited · 0.07mm/px · 14 of 34 slices shown]
[im 1/34]
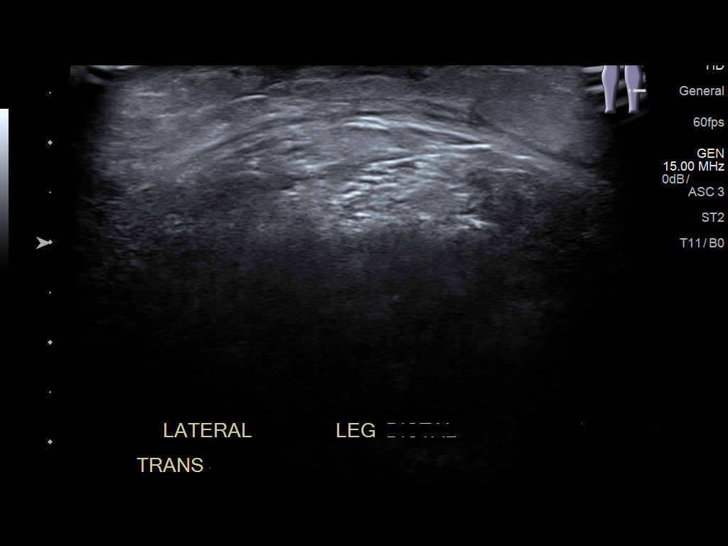
[im 3/34]
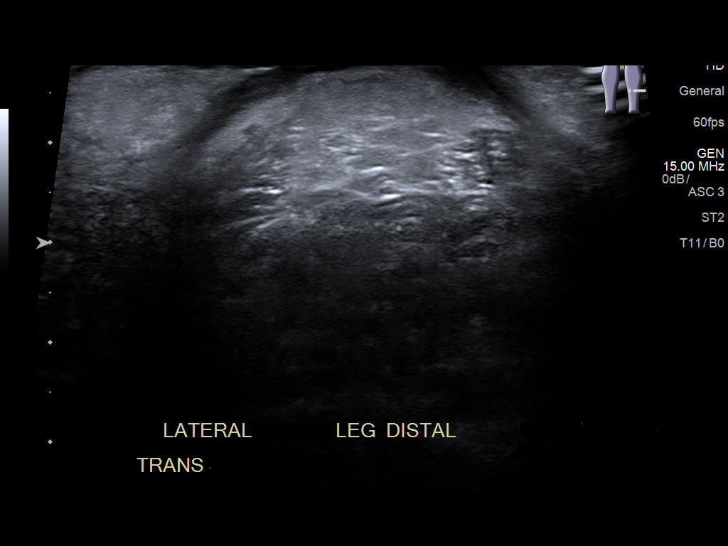
[im 6/34]
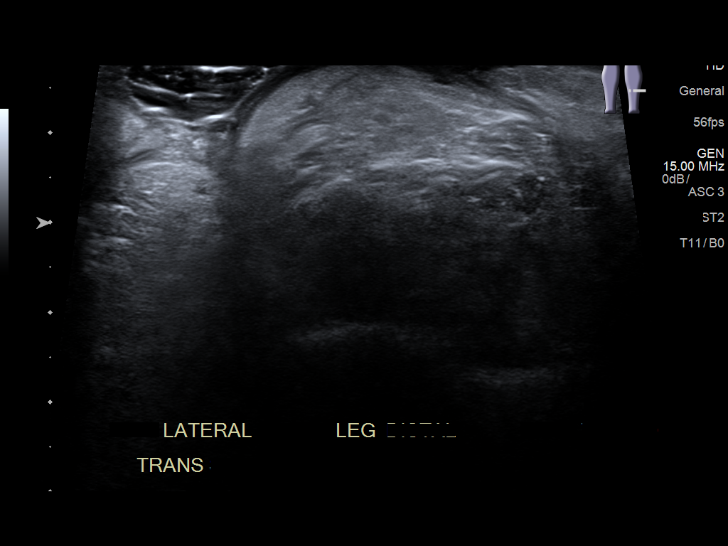
[im 9/34]
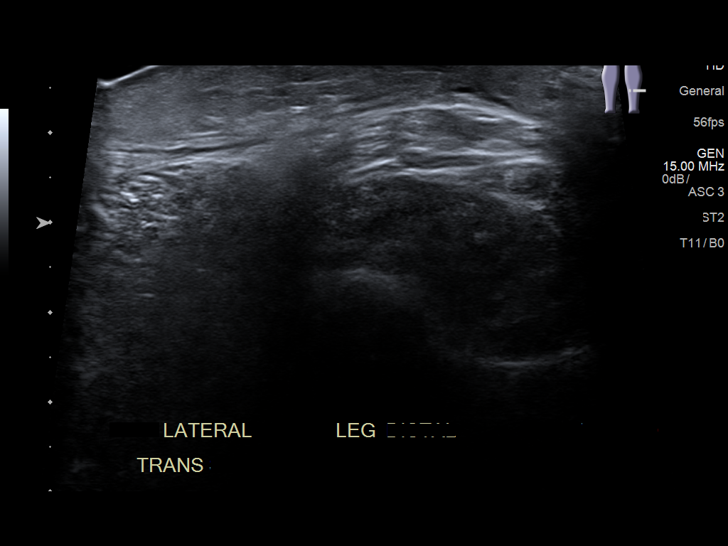
[im 12/34]
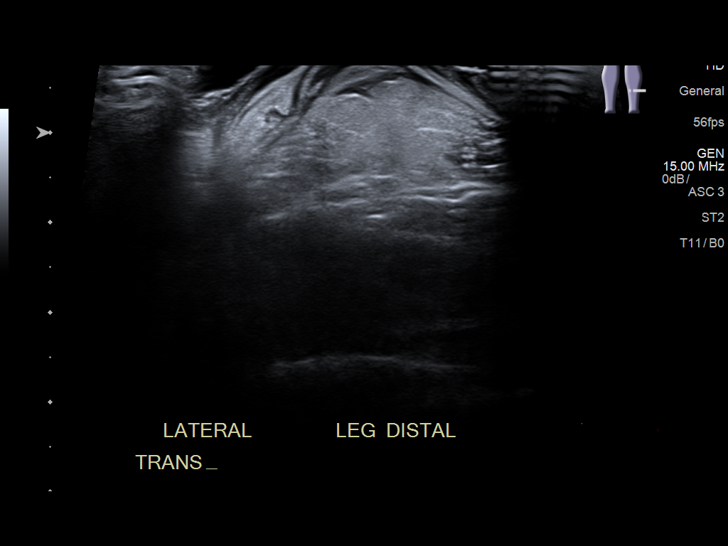
[im 13/34]
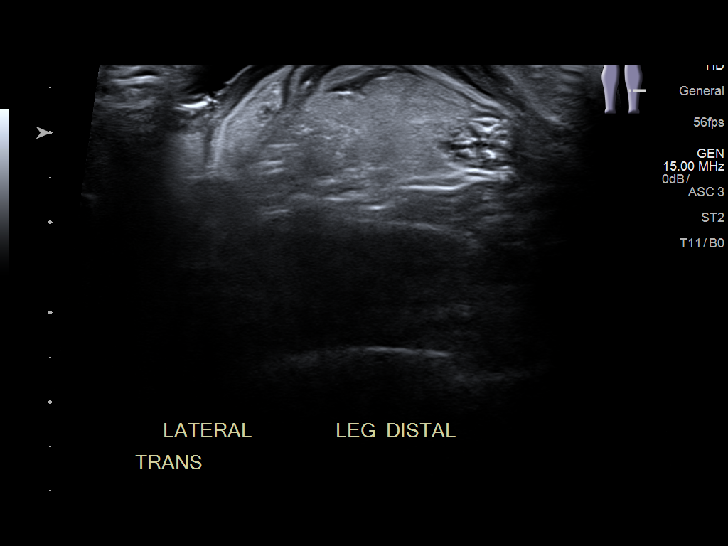
[im 16/34]
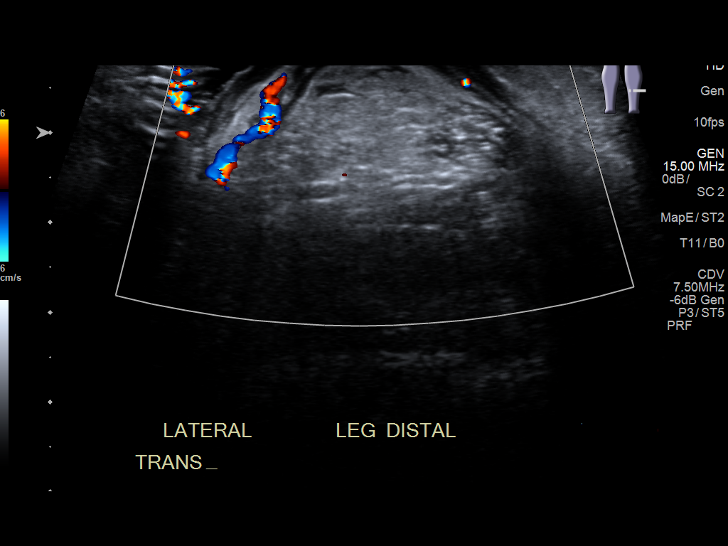
[im 18/34]
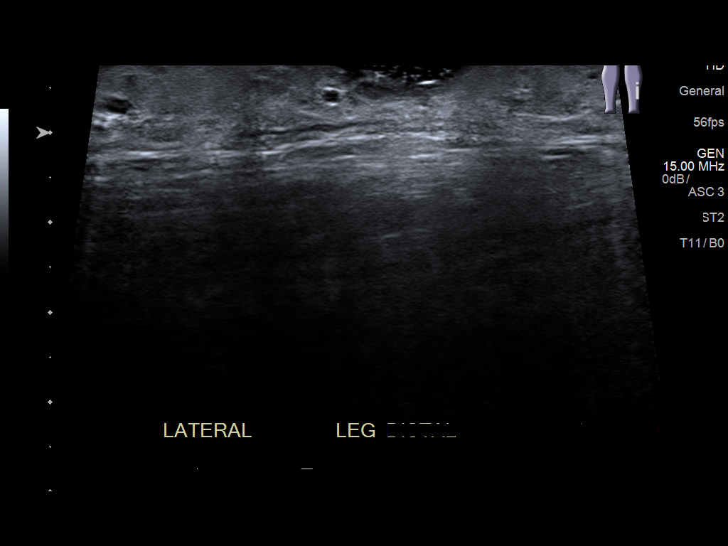
[im 21/34]
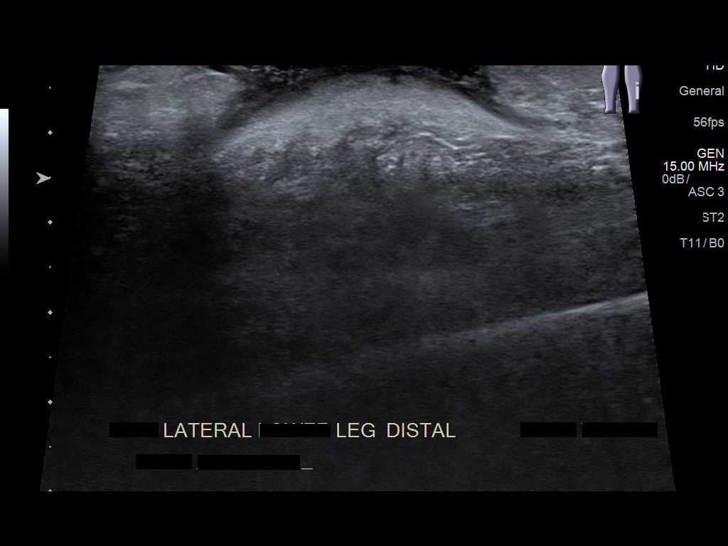
[im 23/34]
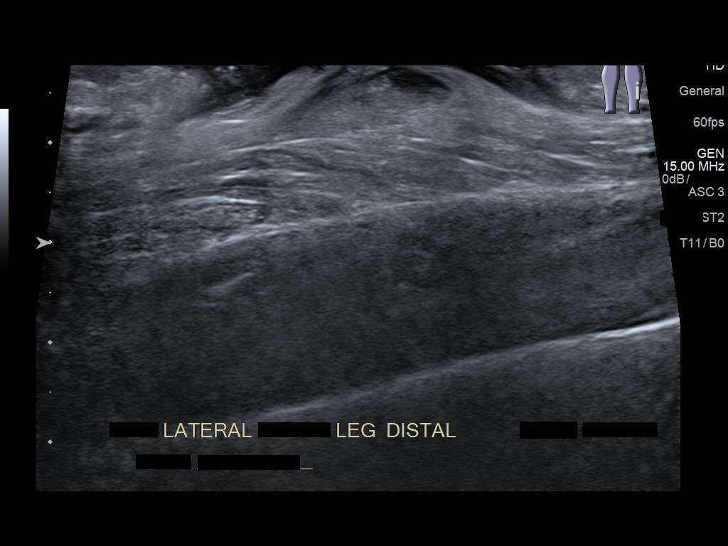
[im 25/34]
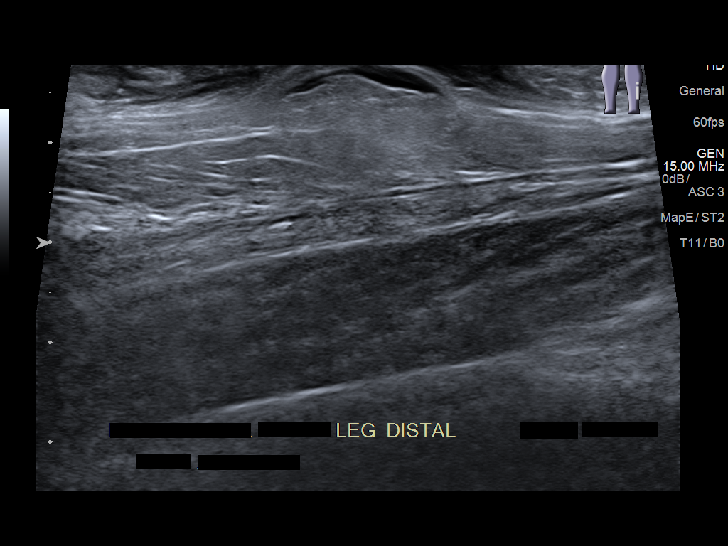
[im 28/34]
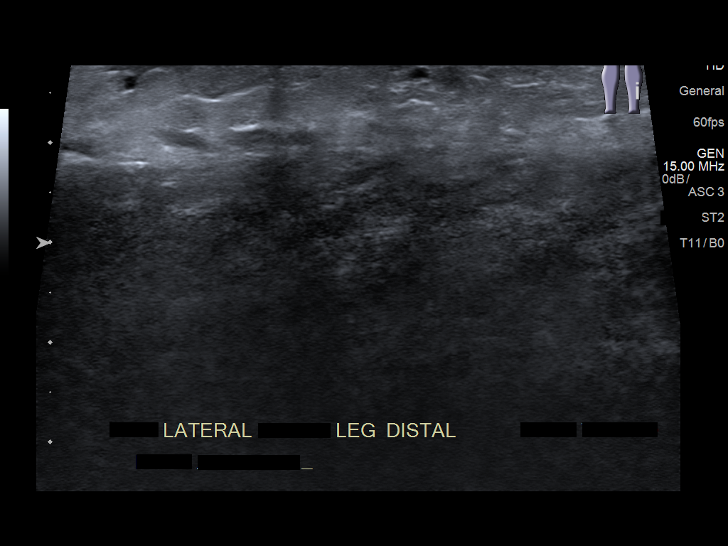
[im 31/34]
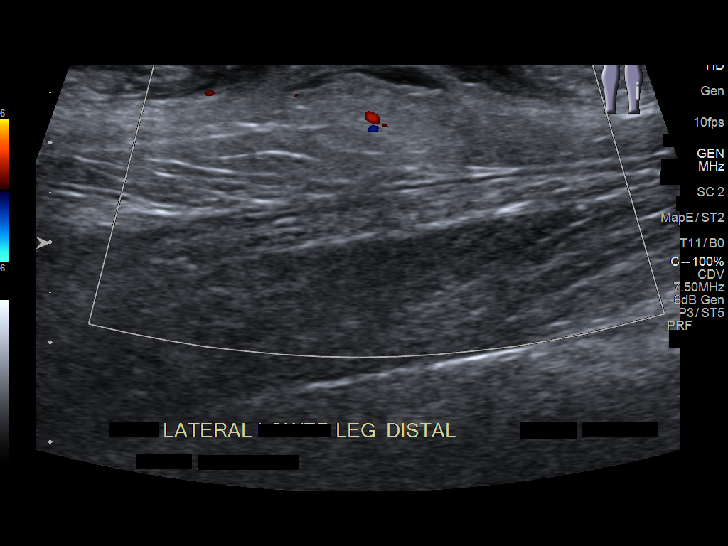
[im 34/34]
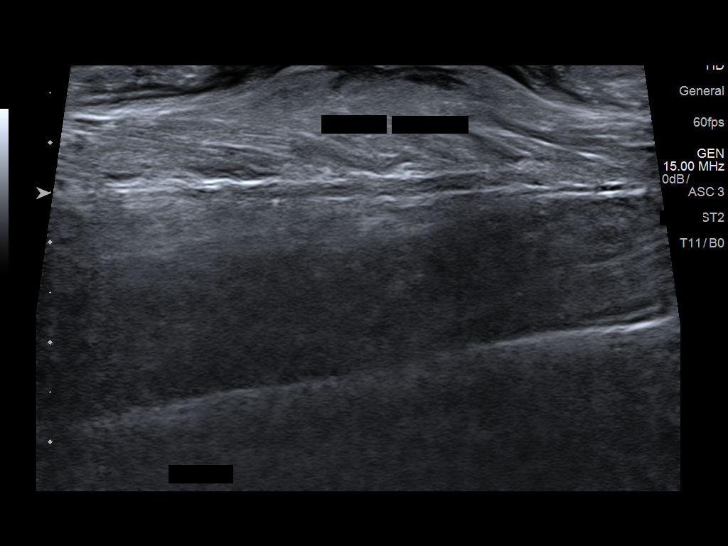

[14 of 25 positions shown; findings below may reference images not displayed]

FINDINGS: THERE IS AN OPEN WOUND WHICH WAS SCANNED AND SHOWS EDEMATOUS TISSUES
IN THE BASE BUT NO COLLECTION FOR ABSCESS. NO SHADOWING FOR SOFT
TISSUE GAS.
IMPRESSION: Edematous tissue in the base of the wound without abscess.

## 2019-02-15 ENCOUNTER — Other Ambulatory Visit: Payer: Self-pay

## 2019-02-15 ENCOUNTER — Encounter: Payer: 59 | Admitting: Physician Assistant

## 2019-02-15 DIAGNOSIS — L97222 Non-pressure chronic ulcer of left calf with fat layer exposed: Secondary | ICD-10-CM | POA: Diagnosis not present

## 2019-02-15 NOTE — Progress Notes (Addendum)
YASMIN, DIBELLO ([id number]) Visit Report for 02/15/2019 Chief Complaint Document Details Patient Name: [name], [name] Date of Service: 02/15/2019 12:30 PM Medical Record Number: [id number] Patient Account Number: [id number] Date of Birth/Sex: Feb 16, 1981 (38 y.o. M) Treating RN: Harold Barban Primary Care Provider: [name] Other Clinician: Referring Provider: [name] Treating Provider/Extender: Melburn Hake, Rosalind Guido Weeks in Treatment: 114 Information Obtained from: Patient Chief Complaint He is here in follow up evaluation for LLE pyoderma ulcer Electronic Signature(s) Signed: 02/15/2019 12:51:15 PM By: Worthy Keeler PA-C Entered By: Worthy Keeler on 02/15/2019 12:51:15 CALEB, DECOCK ([id number]) -------------------------------------------------------------------------------- HPI Details Patient Name: [name] Date of Service: 02/15/2019 12:30 PM Medical Record Number: [id number] Patient Account Number: [id number] Date of Birth/Sex: May 24, 1980 (38 y.o. M) Treating RN: Harold Barban Primary Care Provider: [name] Other Clinician: Referring Provider: [name] Treating Provider/Extender: Melburn Hake, Anabel Lykins Weeks in Treatment: 114 History of Present Illness HPI Description: 12/04/16; 38 year old man who comes into the clinic today for review of a wound on the posterior left calf. He tells me that is been there for about a year. He is not a diabetic he does smoke half a pack per day. He was seen in the ER on 11/20/16 felt to have cellulitis around the wound and was given clindamycin. An x-ray did not show osteomyelitis. The patient initially tells me that he has a milk allergy that sets off a pruritic itching rash on his lower legs which she scratches incessantly and he thinks that's what may have set up the wound. He has been using various topical antibiotics and ointments without any effect. He works in a [employer] and is on his feet all day. He does not  have a prior history of wounds however he does have the rash on both lower legs the right arm and the ventral aspect of his left arm. These are excoriations and clearly have had scratching however there are of macular looking areas on both legs including a substantial larger area on the right leg. This does not have an underlying open area. There is no blistering. The patient tells me that 2 years ago in [place] in response to the rash on his legs he saw a dermatologist who told him he had a condition which may be pyoderma gangrenosum although I may be putting words into his mouth. He seemed to recognize this. 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 He is not in any treatment for this. He's had no recent travel 12/11/16; the patient arrives today with his wound and roughly the same condition we've been using silver alginate this is a deep punched out wound with some surrounding erythema but no tenderness. Biopsy I did did not show confirmed pyoderma gangrenosum suggested nonspecific inflammation and vasculitis but does not provide an actual description of what was seen by the pathologist. I'm really not able to understand this We have also received information from the patient's dermatologist in [place] notes from April 2016. This was a doctor Agarwal- antal. 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 He was prescribed topical steroid high potency under occlusion which helped but at this point the patient did not have a deep punched out wound. 12/18/16; the patient's wound is larger in terms of surface area however this surface looks better and there is less depth. The surrounding erythema also is better. The patient states that the wrap we put on came off 2 days ago when he has been using  his compression stockings. He we are in the process of getting a dermatology consult. 12/26/16 on evaluation today patient's left lower extremity wound shows  evidence of infection with surrounding erythema noted. He has been tolerating the dressing changes but states that he has noted more discomfort. There is a larger area of erythema surrounding the wound. No fevers, chills, nausea, or vomiting noted at this time. With that being said the wound still does have slough covering the surface. He is not allergic to any medication that he is aware of at this point. In regard to his right lower extremity he had several regions that are erythematous and pruritic he wonders if there's anything we can do to help that. 01/02/17 I reviewed patient's wound culture which was obtained his visit last week. He was placed on doxycycline at that point. Unfortunately that does not appear to be an antibiotic that would likely help with the situation however the pseudomonas noted on culture is sensitive to Cipro. Also unfortunately patient's wound seems to have a large compared to last week's evaluation. Not severely so but there are definitely increased measurements in general. He is continuing to have discomfort as well he writes this to be a seven out of 10. In fact he would prefer me not to perform any debridement today due to the fact that he is having discomfort and considering he has an active infection on the little reluctant to do so anyway. No fevers, chills, nausea, or vomiting noted at this time. 01/08/17; patient seems dermatology on September 5. I suspect dermatology will want the slides from the biopsy I did sent to their pathologist. I'm not sure if there is a way we can expedite that. In any case the culture I did before I left on vacation 3 weeks ago showed Pseudomonas he was given 10 days of Cipro and per her description of her intake nurses is actually somewhat better this week although the wound is quite a bit bigger than I remember the last time I saw this. He still has 3 more days of Cipro OSIE, AMPARO ([id number]) 01/21/17; dermatology appointment  tomorrow. He has completed the ciprofloxacin for Pseudomonas. Surface of the wound looks better however he is had some deterioration in the lesions on his right leg. Meantime the left lateral leg wound we will continue with sample 01/29/17; patient had his dermatology appointment but I can't yet see that note. He is completed his antibiotics. The wound is more superficial but considerably larger in circumferential area than when he came in. This is in his left lateral calf. He also has swollen erythematous areas with superficial wounds on the right leg and small papular areas on both arms. There apparently areas in her his upper thighs and buttocks I did not look at those. Dermatology biopsied the right leg. Hopefully will have their input next week. 02/05/17; patient went back to see his dermatologist who told him that he had a "scratching problem" as well as staph. He is now on a 30 day course of doxycycline and I believe she gave him triamcinolone cream to the right leg areas to help with the itching [not exactly sure but probably triamcinolone]. She apparently looked at the left lateral leg wound although this was not rebiopsied and I think felt to be ultimately part of the same pathogenesis. He is using sample border foam and changing nevus himself. He now has a new open area on the right posterior leg which was his biopsy site I don't  have any of the dermatology notes 02/12/17; we put the patient in compression last week with SANTYL to the wound on the left leg and the biopsy. Edema is much better and the depth of the wound is now at level of skin. Area is still the same oBiopsy site on the right lateral leg we've also been using santyl with a border foam dressing and he is changing this himself. 02/19/17; Using silver alginate started last week to both the substantial left leg wound and the biopsy site on the right wound. He is tolerating compression well. Has a an appointment with his primary  M.D. tomorrow wondering about diuretics although I'm wondering if the edema problem is actually lymphedema 02/26/17; the patient has been to see his primary doctor Dr. Jerrel Ivory at [place] our primary care. She started him on Lasix 20 mg and this seems to have helped with the edema. However we are not making substantial change with the left lateral calf wound and inflammation. The biopsy site on the right leg also looks stable but not really all that different. 03/12/17; the patient has been to see vein and vascular Dr. Lucky Cowboy. He has had venous reflux studies I have not reviewed these. I did get a call from his dermatology office. 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 They therefore recommended clobetasol and tetracycline i.e. 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 03/26/17; apparently the patient just had reflux studies not an appointment with Dr. dew. She arrives in clinic today having applied clobetasol for 2-3 weeks. He notes over the last 2-3 days excessive drainage having to change the dressing 3-4 times a day and also expanding erythema. 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 Lillia Carmel at [facility] dermatology locally who kindly saw him at my request. 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 My concern was the degree of erythema medially extending from his left leg wound which was either  inflammation from pyoderma or cellulitis. I put him on Augmentin however culture of the wound showed Pseudomonas which is quinolone sensitive. I really don't believe he has cellulitis however in view of everything I will continue and give him a course of Cipro. He is also on doxycycline as an immune modulator for the pyoderma. In addition to his original wound on the left lateral leg with surrounding erythema he has a wound on the right posterior calf which was an original biopsy site done by dermatology. 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 Saw Dr. Lillia Carmel yesterday. He has been using topical antibiotics to both wound areas his original wound on the left and the biopsies/pathergy area on the right. There is definitely some improvement in the inflammation around the wound on the right although the patient states he has increasing sensitivity of the wounds. He is on prednisone 60 and doxycycline 1 as prescribed by Dr. Lillia Carmel. He is covering the topical  antibiotic with gauze and putting this in his own compression stocks and changing this daily. He states that Dr. Lottie Rater did a culture of the left leg wound yesterday 05/07/17; pyoderma gangrenosum. The patient saw Dr. Lillia Carmel yesterday and has a follow-up with her in one month. He is still using topical antibiotics to both wounds although he can't recall exactly what type. He is still on prednisone 60 mg. Dr. Lillia Carmel stated that the doxycycline could stop if we were in agreement. 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 The right medial leg was induced by biopsy/pathergy. The area on the right is essentially healed. 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 The right medial leg has  closed and remains closed. He is still on prednisone 60. oHe tells me he missed his last dermatology appointment with Dr. Lillia Carmel but will make another appointment. He reports that her blood sugar at a recent screen in [place] was high 200's. He was 180 today. He is more cushingoid blood pressure is Senn, Jaycion ([id number]) up a bit. I think he is going to require still much longer prednisone perhaps another 3 months before attempting to taper. In the meantime his wound is a lot better. Smaller. He is cleaning this off daily and applying topical antibiotics. When he was last in the clinic I thought about changing to [facility] and actually put in a couple of calls to dermatology although probably not during their business hours. 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 He continues on oral prednisone. He has been using triple antibiotic ointment. There is surface debris and we will transition to [facility] and have him return in 2 weeks. He has lost 30 pounds since his last appointment with lifestyle modification. He may benefit from topical steroid cream for treatment this can be considered at a later date. 08/22/17 on evaluation today patient appears to actually be doing rather well in regard to his left lateral lower extremity ulcer. He has actually been managed by Dr. Dellia Nims most recently. Patient is currently on oral steroids at this time. This seems to have been of benefit for him. Nonetheless his last visit was actually with Leah on 08/06/17. Currently he is not utilizing any topical steroid creams although this could be of benefit as well. No fevers, chills, nausea, or vomiting noted at this time. 09/05/17 on evaluation today patient appears to be doing better in regard to his left lateral lower extremity ulcer. He has been tolerating the dressing changes without  complication. He is using Santyl with good effect. Overall I'm very pleased with how things are standing at this point. Patient likewise is happy that this is doing better. 09/19/17 on evaluation today patient actually appears to be doing rather well in regard to his left lateral lower extremity ulcer. 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 He's not having any significant pain. 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 He has been tolerating the Santyl without complication and in general I feel like he's making good  progress. 10/17/17 on evaluation today patient appears to be doing very well in regard to his left lateral lower surety ulcer. He has been tolerating the dressing changes without complication. There does not appear to be any evidence of infection he's alternating the Santyl and the triple antibiotic ointment every other day this seems to be doing well for him. 11/03/17 on evaluation today patient appears to be doing very well in regard to his left lateral lower extremity ulcer. He is been tolerating the dressing changes without complication which is good news. Fortunately there does not appear to be any evidence of infection which is also great news. Overall is doing excellent they are starting to taper down on the prednisone is down to 40 mg at this point it also started topical clobetasol for him. 11/17/17 on evaluation today patient appears to be doing well in regard to his left lateral lower surety ulcer. He's been tolerating the dressing changes without complication. He does note that he is having no pain, no excessive drainage or discharge, and overall he feels like things are going about how he would expect and hope they would. Overall he seems to have no evidence of infection at this time in my  opinion which is good news. 12/04/17-He is seen in follow-up evaluation for right lateral lower extremity ulcer. He has been applying topical steroid cream. Today's measurement show slight increase in size. Over the next 2 weeks we will transition to every other day Santyl and steroid cream. He has been encouraged to monitor for changes and notify clinic with any concerns 12/15/17 on evaluation today patient's left lateral motion the ulcer and fortunately is doing worse again at this point. This just since last week to this week has close to doubled in size according to the patient. I did not seeing last week's I do not have a visual to compare this to in our system was also down so we do not have all the charts and at this point. Nonetheless it does have me somewhat concerned in regard to the fact that again he was worried enough about it he has contact the dermatology that placed them back on the full strength, 50 mg a day of the prednisone that he was taken previous. He continues to alternate using clobetasol along with Santyl at this point. He is obviously somewhat frustrated. 12/22/17 on evaluation today patient appears to be doing a little worse compared to last evaluation. Unfortunately the wound is a little deeper and slightly larger than the last week's evaluation. With that being said he has made some progress in regard to the irritation surrounding at this time unfortunately despite that progress that's been made he still has a significant issue going on here. 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 For that reason I discussed with him today the possibility of perform a wound culture to ensure there's nothing overtly infected. 01/06/18 on evaluation today patient's wound is larger and deeper than previously evaluated. With that being said it  did appear that his wound was infected after my last evaluation with him. Subsequently I did end up prescribing a prescription for Bactrim DS which she has been taking and having no complication with. Fortunately there does not appear to be any evidence of Ottey, Coree ([id number]) infection at this point in time as far as anything spreading, no want to  touch, and overall I feel like things are showing signs of improvement. 01/13/18 on evaluation today patient appears to be even a little larger and deeper than last time. There still muscle exposed in the base of the wound. Nonetheless he does appear to be less erythematous I do believe inflammation is calming down also believe the infection looks like it's probably resolved at this time based on what I'm seeing. No fevers, chills, nausea, or vomiting noted at this time. 01/30/18 on evaluation today patient actually appears to visually look better for the most part. Unfortunately those visually this looks better he does seem to potentially have what may be an abscess in the muscle that has been noted in the central portion of the wound. This is the first time that I have noted what appears to be fluctuance in the central portion of the muscle. With that being said I'm somewhat more concerned about the fact that this might indicate an abscess formation at this location. I do believe that an ultrasound would be appropriate. This is likely something we need to try to do as soon as possible. He has been switch to mupirocin ointment and he is no longer using the steroid ointment as prescribed by dermatology he sees them again next week he's been decreased from 60 to 40 mg of prednisone. 03/09/18 on evaluation today patient actually appears to be doing a little better compared to last time I saw him. There's not as much erythema surrounding the wound itself. He I did review his most recent infectious disease note which was dated 02/24/18. He saw Dr. Michel Bickers in [place]. 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 Patient is now off of antibiotics and they are continually observing this. There seems to be no change in the past two weeks in my pinion based on what the patient says and what I see today compared to what Dr. Megan Salon likely saw two weeks ago. No fevers, chills, nausea, or vomiting noted at this time. 03/23/18 on evaluation today patient's wound actually appears to be showing signs of improvement which is good news. He is currently still on the Dapsone. He is also working on tapering the prednisone to get off of this and Dr. Lottie Rater is working with him in this regard. Nonetheless overall I feel like the wound is doing well it does appear based on the infectious disease note that I reviewed from Dr. 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 I did also review the results of his ultrasound of left lower extremity which revealed there was a dentist tissue in the base of the wound without an abscess noted. 04/06/18 on evaluation today the patient's left lateral lower extremity ulcer actually appears to be doing fairly well which is excellent news. There does not appear to be any evidence of infection at this time which is also great news. Overall he still does have a significantly large ulceration although little by little he seems to be making progress. He is down to 10 mg a day of the prednisone. 04/20/18 on evaluation today patient actually appears to be doing excellent at this time in regard to his left lower extremity ulcer. He's making signs of good progress unfortunately this is taking much longer than we would really like  to see but nonetheless he is making progress. Fortunately there does not appear to be any evidence of  infection at this time. No fevers, chills, nausea, or vomiting noted at this time. The patient has not been using the Santyl due to the cost he hadn't got in this field yet. He's mainly been using the antibiotic ointment topically. Subsequently he also tells me that he really has not been scrubbing in the shower I think this would be helpful again as I told him it doesn't have to be anything too aggressive to even make it believe just enough to keep it free of some of the loose slough and biofilm on the wound surface. 05/11/18 on evaluation today patient's wound appears to be making slow but sure progress in regard to the left lateral lower extremity ulcer. He is been tolerating the dressing changes without complication. Fortunately there does not appear to be any evidence of infection at this time. He is still just using triple antibiotic ointment along with clobetasol occasionally over the area. He never got the Santyl and really does not seem to intend to in my pinion. 06/01/18 on evaluation today patient appears to be doing a little better in regard to his left lateral lower extremity ulcer. He states that overall he does not feel like he is doing as well with the Dapsone as he did with the prednisone. Nonetheless he sees his dermatologist later today and is gonna talk to them about the possibility of going back on the prednisone. Overall again I believe that the wound would be better if you would utilize Santyl but he really does not seem to be interested in going back to the [place] at this point. He has been using triple antibiotic ointment. 06/15/18 on evaluation today patient's wound actually appears to be doing about the same at this point. Fortunately there is no signs of infection at this time. He has made slight improvements although he continues to not really want to clean the wound bed at this point. He states that he just doesn't mess with it he doesn't want to cause any problems with  everything else he has going on. He has been on medication, antibiotics as prescribed by his dermatologist, for a staff infection of his lower extremities which is really drying out now and looking much better he tells me. Fortunately there is no sign of overall infection. LOMAX, POEHLER ([id number]) 06/29/18 on evaluation today patient appears to be doing well in regard to his left lateral lower surety ulcer all things considering. Fortunately his staff infection seems to be greatly improved compared to previous. He has no signs of infection and this is drying up quite nicely. He is still the doxycycline for this is no longer on cental, Dapsone, or any of the other medications. His dermatologist has recommended possibility of an infusion but right now he does not want to proceed with that. 07/13/18 on evaluation today patient appears to be doing about the same in regard to his left lateral lower surety ulcer. Fortunately there's no signs of infection at this time which is great news. Unfortunately he still builds up a significant amount of Slough/biofilm of the surface of the wound he still is not really cleaning this as he should be appropriately. Again I'm able to easily with saline and gauze remove the majority of this on the surface which if you would do this at home would likely be a dramatic improvement for him as far as getting the area  to improve. Nonetheless overall I still feel like he is making progress is just very slow. I think Santyl will be of benefit for him as well. Still he has not gotten this as of this point. 07/27/18 on evaluation today patient actually appears to be doing little worse in regards of the erythema around the periwound region of the wound he also tells me that he's been having more drainage currently compared to what he was experiencing last time I saw him. He states not quite as bad as what he had because this was infected previously but nonetheless is still appears  to be doing poorly. Fortunately there is no evidence of systemic infection at this point. The patient tells me that he is not going to be able to afford the Santyl. He is still waiting to hear about the infusion therapy with his dermatologist. Apparently she wants an updated colonoscopy first. 08/10/18 on evaluation today patient appears to be doing better in regard to his left lateral lower extremity ulcer. Fortunately he is showing signs of improvement in this regard he's actually been approved for Remicade infusion's as well although this has not been scheduled as of yet. Fortunately there's no signs of active infection at this time in regard to the wound although he is having some issues with infection of the right lower extremity is been seen as dermatologist for this. Fortunately they are definitely still working with him trying to keep things under control. 09/07/18 on evaluation today patient is actually doing rather well in regard to his left lateral lower extremity ulcer. He notes these actually having some hair grow back on his extremity which is something he has not seen in years. He also tells me that the pain is really not giving them any trouble at this time which is also good news overall she is very pleased with the progress he's using a combination of the mupirocin along with the probate is all mixed. 09/21/18 on evaluation today patient actually appears to be doing fairly well all things considered in regard to his looks from the ulcer. He's been tolerating the dressing changes without complication. Fortunately there's no signs of active infection at this time which is good news he is still on all antibiotics or prevention of the staff infection. He has been on prednisone for time although he states it is gonna contact his dermatologist and see if she put them on a short course due to some irritation that he has going on currently. Fortunately there's no evidence of any overall  worsening this is going very slow I think cental would be something that would be helpful for him although he states that $50 for tube is quite expensive. He therefore is not willing to get that at this point. 10/06/18 on evaluation today patient actually appears to be doing decently well in regard to his left lateral leg ulcer. He's been tolerating the dressing changes without complication. Fortunately there's no signs of active infection at this time. Overall I'm actually rather pleased with the progress he's making although it's slow he doesn't show any signs of infection and he does seem to be making some improvement. I do believe that he may need a switch up and dressings to try to help this to heal more appropriately and quickly. 10/19/18 on evaluation today patient actually appears to be doing better in regard to his left lateral lower extremity ulcer. This is shown signs of having much less Slough buildup at this point due to  the fact he has been using the Santyl. Obviously this is very good news. The overall size of the wound is not dramatically smaller but again the appearance is. 11/02/18 on evaluation today patient actually appears to be doing quite well in regard to his lower [place] ulcer. A lot of the skin around the ulcer is actually somewhat irritating at this point this seems to be more due to the dressing causing irritation from the adhesive that anything else. Fortunately there is no signs of active infection at this time. 11/24/18 on evaluation today patient appears to be doing a little worse in regard to his overall appearance of his lower extremity ulcer. There's more erythema and warmth around the wound unfortunately. He is currently on doxycycline which he has been on for some time. With that being said I'm not sure that seems to be helping with what appears to possibly be an acute cellulitis with regard to his left lower extremity ulcer. No fevers, chills, nausea, or vomiting  noted at this time. 12/08/18 on evaluation today patient's wounds actually appears to be doing significantly better compared to his last evaluation. He has been using Santyl along with alternating tripling about appointment as well as the steroid cream seems to be doing Winterrowd, Reyes ([id number]) quite well and the wound is showing signs of improvement which is excellent news. Fortunately there's no evidence of infection and in fact his culture came back negative with only normal skin flora noted. 12/21/2018 upon evaluation today patient actually appears to be doing excellent with regard to his ulcer. This is actually the best that I have seen it since have been helping to take care of him. It is both smaller as well as less slough noted on the surface of the wound and seems to be showing signs of good improvement with new skin growing from the edges. He has been using just the triamcinolone he does wonder if he can get a refill of that ointment today. 01/04/2019 upon evaluation today patient actually appears to be doing well with regard to his left lateral lower extremity ulcer. With that being said it does not appear to be that he is doing quite as well as last time as far as progression is concerned. There does not appear to be any signs of infection or significant irritation which is good news. With that being said I do believe that he may benefit from switching to a collagen based dressing based on how clean The wound appears. 01/18/2019 on evaluation today patient actually appears to be doing well with regard to his wound on the left lower extremity. He is not made a lot of progress compared to where we were previous but nonetheless does seem to be doing okay at this time which is good news. There is no signs of active infection which is also good news. My only concern currently is I do wish we can get him into utilizing the collagen dressing his insurance would not pay for the supplies that we  ordered although it appears that he may be able to order this through his supply company that he typically utilizes. This is [employer]. Nonetheless he did try to order it during the office visit today and it appears this did go through. We will see if he can get that it is a different brand but nonetheless he has collagen and I do think will be beneficial. 02/01/2019 on evaluation today patient actually appears to be doing a little worse today in regard  to the overall size of his wounds. Fortunately there is no signs of active infection at this time. That is visually. Nonetheless when this is happened before it was due to infection. For that reason were somewhat concerned about that this time as well. 02/08/2019 on evaluation today patient unfortunately appears to be doing slightly worse with regard to his wound upon evaluation today. Is measuring a little deeper and a little larger unfortunately. I am not really sure exactly what is causing this to enlarge he actually did see his dermatologist she is going to see about initiating Humira for him. Subsequently she also did do steroid injections into the wound itself in the periphery. Nonetheless still nonetheless he seems to be getting a little bit larger he is gone back to just using the steroid cream topically which I think is appropriate. I would say hold off on the collagen for the time being is definitely a good thing to do. Based on the culture results which we finally did get the final result back regarding it shows staph as the bacteria noted again that can be a normal skin bacteria based on the fact however he is having increased drainage and worsening of the wound measurement wise I would go ahead and place him on an antibiotic today I do believe for this. 02/15/2019 on evaluation today patient actually appears to be doing somewhat better in regard to his ulcer. There is no signs of worsening at this time I did review his culture results which  showed evidence of Staphylococcus aureus but not MRSA. Again this could just be more related to the normal skin bacteria although he states the drainage has slowed down quite a bit he may have had a mild infection not just colonization. Electronic Signature(s) Signed: 02/15/2019 12:59:52 PM By: Worthy Keeler PA-C Entered By: Worthy Keeler on 02/15/2019 12:59:52 KORAN, SEABROOK ([id number]) -------------------------------------------------------------------------------- Physical Exam Details Patient Name: [name] Date of Service: 02/15/2019 12:30 PM Medical Record Number: [id number] Patient Account Number: [id number] Date of Birth/Sex: 1980/08/15 (38 y.o. M) Treating RN: Harold Barban Primary Care Provider: [name] Other Clinician: Referring Provider: [name] Treating Provider/Extender: Melburn Hake, Rakel Junio Weeks in Treatment: 114 Constitutional Obese and well-hydrated in no acute distress. Respiratory normal breathing without difficulty. clear to auscultation bilaterally. Cardiovascular regular rate and rhythm with normal S1, S2. Psychiatric this patient is able to make decisions and demonstrates good insight into disease process. Alert and Oriented x 3. pleasant and cooperative. Notes Patient's wound bed currently showed signs of good granulation at this time there was some slough noted but fortunately not even as thick as noted last week. I do believe the Augmentin has been on him some good at this point based on what I am seeing. Fortunately there is no signs of active infection at this time. No fevers, chills, nausea, vomiting, or diarrhea. Electronic Signature(s) Signed: 02/15/2019 1:00:27 PM By: Worthy Keeler PA-C Entered By: Worthy Keeler on 02/15/2019 13:00:26 AVAN, GULLETT ([id number]) -------------------------------------------------------------------------------- Physician Orders Details Patient Name: [name] Date of Service: 02/15/2019 12:30  PM Medical Record Number: [id number] Patient Account Number: [id number] Date of Birth/Sex: [birth year] (38 y.o. 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 Anesthetic (add to Medication List) Wound #1 Left,Lateral Lower Leg o Topical Lidocaine 4% cream applied to wound bed prior to debridement (In Clinic Only). Primary Wound Dressing Wound #1 Left,Lateral Lower Leg o Other: - TCA to wound bed only Secondary Dressing Wound #1 Left,Lateral Lower Leg o Boardered Foam Dressing Dressing Change Frequency Wound #1 Left,Lateral Lower Leg o Change dressing every other day. Follow-up Appointments Wound #1 Left,Lateral Lower Leg o Return Appointment in 1 week. Edema Control Wound #1 Left,Lateral Lower Leg o Elevate legs to the level of the heart and pump ankles as often as possible Electronic Signature(s) Signed: 02/15/2019 5:27:38 PM By: Harold Barban Signed: 02/15/2019 6:24:05 PM By: Worthy Keeler PA-C Entered By: Harold Barban on 02/15/2019 12:57:26 Reierson, Imir ([id number]) RANJIT, ASHURST ([id number]) -------------------------------------------------------------------------------- Problem List Details Patient Name: [name] Date of Service: 02/15/2019 12:30 PM Medical Record Number: [id number] Patient Account Number: [id number] Date of Birth/Sex: 1980-10-21 (38 y.o. M) Treating RN: Harold Barban Primary Care Provider: [name] Other Clinician: Referring Provider: [name] Treating Provider/Extender:  Melburn Hake, Oleda Borski Weeks in Treatment: 114 Active Problems ICD-10 Evaluated Encounter Code Description Active Date Today Diagnosis L97.222 Non-pressure chronic ulcer of left calf with fat layer exposed 12/04/2016 No Yes L88 Pyoderma gangrenosum 03/26/2017 No Yes I87.2 Venous insufficiency (chronic) (peripheral) 12/04/2016 No Yes Inactive Problems ICD-10 Code Description Active Date Inactive Date L97.213 Non-pressure chronic ulcer of right calf with necrosis of muscle 04/02/2017 04/02/2017 Resolved Problems Electronic Signature(s) Signed: 02/15/2019 12:51:07 PM By: Worthy Keeler PA-C Entered By: Worthy Keeler on 02/15/2019 12:51:07 [name] ([id number]) -------------------------------------------------------------------------------- Progress Note Details Patient Name: [name] Date of Service: 02/15/2019 12:30 PM Medical Record Number: [id number] Patient Account Number: [id number] Date of Birth/Sex: Dec 06, 1980 (38 y.o. M) Treating RN: Harold Barban Primary Care Provider: [name] Other Clinician: Referring Provider: [name] Treating Provider/Extender: Melburn Hake, Zyrus Hetland Weeks in Treatment: 114 Subjective Chief Complaint Information obtained from Patient He is here in follow up evaluation for LLE pyoderma ulcer History of Present Illness (HPI) 12/04/16; 38 year old man who comes into the clinic today for review of a wound on the posterior left calf. He tells me that is been there for about a year. He is not a diabetic he does smoke half a pack per day. He was seen in the ER on 11/20/16 felt to have cellulitis around the wound and was given clindamycin. An x-ray did not show osteomyelitis. The patient initially tells me that he has a milk allergy that sets off a pruritic itching rash on his lower legs which she scratches incessantly and he thinks that's what may have set up the wound. He has been using various topical antibiotics and ointments without any effect.  He works in a [employer]ing [employer] and is on his feet all day. He does not have a prior history of wounds however he does have the rash on both lower legs the right arm and the ventral aspect of his left arm. These are excoriations and clearly have had scratching however there are of macular looking areas on both legs including a substantial larger area on the right leg. This does not have an underlying open area. There is no blistering. The patient tells me that 2 years ago in [place] in response to the rash on his legs he saw a dermatologist who told him he had a condition which may be pyoderma gangrenosum although I may be putting words into his  mouth. He seemed to recognize this. 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 He is not in any treatment for this. He's had no recent travel 12/11/16; the patient arrives today with his wound and roughly the same condition we've been using silver alginate this is a deep punched out wound with some surrounding erythema but no tenderness. Biopsy I did did not show confirmed pyoderma gangrenosum suggested nonspecific inflammation and vasculitis but does not provide an actual description of what was seen by the pathologist. I'm really not able to understand this We have also received information from the patient's dermatologist in [place] notes from April 2016. This was a doctor Agarwal- antal. 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 He was prescribed topical steroid high potency under occlusion which helped but at this point the patient did not have a deep punched out wound. 12/18/16; the patient's wound is larger in terms of surface area however this surface looks better and there is less depth. The surrounding erythema also is better. The patient states that the wrap we put on came off 2 days ago when he has been using his compression stockings. He we are in the process of getting a dermatology  consult. 12/26/16 on evaluation today patient's left lower extremity wound shows evidence of infection with surrounding erythema noted. He has been tolerating the dressing changes but states that he has noted more discomfort. There is a larger area of erythema surrounding the wound. No fevers, chills, nausea, or vomiting noted at this time. With that being said the wound still does have slough covering the surface. He is not allergic to any medication that he is aware of at this point. In regard to his right lower extremity he had several regions that are erythematous and pruritic he wonders if there's anything we can do to help that. 01/02/17 I reviewed patient's wound culture which was obtained his visit last week. He was placed on doxycycline at that point. Unfortunately that does not appear to be an antibiotic that would likely help with the situation however the pseudomonas noted on culture is sensitive to Cipro. Also unfortunately patient's wound seems to have a large compared to last week's evaluation. Not severely so but there are definitely increased measurements in general. He is continuing to have discomfort as well he writes this to be a seven out of 10. In fact he would prefer me not to perform any debridement today due to the fact that he is having discomfort and considering he has an active infection on the little reluctant to do so anyway. No fevers, [place], Demitri ([id number]) chills, nausea, or vomiting noted at this time. 01/08/17; patient seems dermatology on September 5. I suspect dermatology will want the slides from the biopsy I did sent to their pathologist. I'm not sure if there is a way we can expedite that. In any case the culture I did before I left on vacation 3 weeks ago showed Pseudomonas he was given 10 days of Cipro and per her description of her intake nurses is actually somewhat better this week although the wound is quite a bit bigger than I remember the last time I  saw this. He still has 3 more days of Cipro 01/21/17; dermatology appointment tomorrow. He has completed the ciprofloxacin for Pseudomonas. Surface of the wound looks better however he is had some deterioration in the lesions on his right leg. Meantime the left lateral leg wound we will continue  with sample 01/29/17; patient had his dermatology appointment but I can't yet see that note. He is completed his antibiotics. The wound is more superficial but considerably larger in circumferential area than when he came in. This is in his left lateral calf. He also has swollen erythematous areas with superficial wounds on the right leg and small papular areas on both arms. There apparently areas in her his upper thighs and buttocks I did not look at those. Dermatology biopsied the right leg. Hopefully will have their input next week. 02/05/17; patient went back to see his dermatologist who told him that he had a "scratching problem" as well as staph. He is now on a 30 day course of doxycycline and I believe she gave him triamcinolone cream to the right leg areas to help with the itching [not exactly sure but probably triamcinolone]. She apparently looked at the left lateral leg wound although this was not rebiopsied and I think felt to be ultimately part of the same pathogenesis. He is using sample border foam and changing nevus himself. He now has a new open area on the right posterior leg which was his biopsy site I don't have any of the dermatology notes 02/12/17; we put the patient in compression last week with SANTYL to the wound on the left leg and the biopsy. Edema is much better and the depth of the wound is now at level of skin. Area is still the same Biopsy site on the right lateral leg we've also been using santyl with a border foam dressing and he is changing this himself. 02/19/17; Using silver alginate started last week to both the substantial left leg wound and the biopsy site on the right  wound. He is tolerating compression well. Has a an appointment with his primary M.D. tomorrow wondering about diuretics although I'm wondering if the edema problem is actually lymphedema 02/26/17; the patient has been to see his primary doctor Dr. Jerrel Ivory at [facility] our primary care. She started him on Lasix 20 mg and this seems to have helped with the edema. However we are not making substantial change with the left lateral calf wound and inflammation. The biopsy site on the right leg also looks stable but not really all that different. 03/12/17; the patient has been to see vein and vascular Dr. Lucky Cowboy. He has had venous reflux studies I have not reviewed these. I did get a call from his dermatology office. 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 They therefore recommended clobetasol and tetracycline i.e. 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 03/26/17; apparently the patient just had reflux studies not an appointment with Dr. dew. She arrives in clinic today having applied clobetasol for 2-3 weeks. He notes over the last 2-3 days excessive drainage having to change the dressing 3-4 times a day and also expanding erythema. 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 Lillia Carmel at [facility] dermatology locally who kindly saw him at my request. 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 My concern was the  degree of erythema medially extending from his left leg wound which was either inflammation from pyoderma or cellulitis. I put him on Augmentin however culture of the wound showed Pseudomonas which is quinolone sensitive. I really don't believe he has cellulitis however in view of everything I will continue and give him a course of Cipro. He is also on doxycycline as an immune modulator for the pyoderma. In addition to his original wound on the left lateral leg with surrounding erythema he has a wound on the right posterior calf which was an original biopsy site done by dermatology. 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 Saw Dr. Lillia Carmel yesterday. He has been using topical antibiotics to both wound areas his original wound on the left and the biopsies/pathergy area on the right. There is definitely some improvement in the inflammation around the wound on the right although the patient states he has increasing sensitivity of the wounds. He is on prednisone 60 and doxycycline 1 as prescribed by Dr. Lillia Carmel. He is covering the topical antibiotic with gauze and putting this in his own compression stocks and changing this daily. He states that Dr. Lottie Rater did a culture of the left leg wound yesterday 05/07/17; pyoderma gangrenosum. The patient saw Dr. Lillia Carmel yesterday and has a follow-up with her in one month. He is still using topical antibiotics to both wounds although he can't recall exactly what type. He is still on prednisone 60 mg. Dr. Lillia Carmel stated that the doxycycline could stop if we were in agreement. 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 The right medial leg was induced by [name] ([id number]) biopsy/pathergy. The area on the right is essentially healed. 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 The right medial leg has closed and remains closed. He is still on prednisone 60. He tells me he missed his last dermatology appointment with Dr. Lillia Carmel but will make another appointment. He reports that her blood sugar at a recent screen in [place] was high 200's. He was 180 today. 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 I think he is going to require still much longer prednisone perhaps another 3 months before attempting to taper. In the meantime his wound is a lot better. Smaller. He is cleaning this off daily and applying topical antibiotics. When he was last in the clinic I thought about changing to [facility] and actually put in a couple of calls to dermatology although probably not during their business hours. 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 He continues on oral prednisone. He has been using triple antibiotic ointment. There is surface debris and we will transition to [facility] and have him return in 2 weeks. He has lost 30 pounds since his last appointment with lifestyle modification. He may benefit from topical steroid cream for treatment this can be considered at a later date. 08/22/17 on evaluation today patient appears to actually be doing rather well in regard to his left lateral lower extremity ulcer. He has actually been managed by Dr. Dellia Nims most recently.  Patient is currently on oral steroids at this time. This seems to have been of benefit for him. Nonetheless his last visit was actually with Leah on 08/06/17. Currently he is not utilizing any topical steroid creams although this could be of benefit as well. No fevers, chills, nausea, or vomiting noted at this time. 09/05/17 on evaluation today patient appears to be doing better in regard to his left lateral  lower extremity ulcer. He has been tolerating the dressing changes without complication. He is using Santyl with good effect. Overall I'm very pleased with how things are standing at this point. Patient likewise is happy that this is doing better. 09/19/17 on evaluation today patient actually appears to be doing rather well in regard to his left lateral lower extremity ulcer. 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 He's not having any significant pain. 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 He has been tolerating the Santyl without complication and in general I feel like he's making good progress. 10/17/17 on evaluation today patient appears to be doing very well in regard to his left lateral lower surety ulcer. He has been tolerating the dressing changes without complication. There does not appear to be any evidence of infection he's alternating the Santyl and the triple antibiotic ointment every other day this seems to be doing well for him. 11/03/17 on evaluation today patient appears to be doing very well in regard to his left lateral lower extremity ulcer. He is been tolerating the dressing changes without complication which is good news. Fortunately there does not appear to be any evidence of infection which is also great news. Overall is doing excellent they are starting to taper down on the prednisone is down to 40 mg at this point it also started topical clobetasol for him. 11/17/17 on evaluation today patient appears to be doing well in regard to his left lateral lower surety ulcer. He's been tolerating the dressing changes without complication. He does note that he is having no pain, no excessive drainage or discharge, and overall he feels like things are going about how he would expect and hope they  would. Overall he seems to have no evidence of infection at this time in my opinion which is good news. 12/04/17-He is seen in follow-up evaluation for right lateral lower extremity ulcer. He has been applying topical steroid cream. Today's measurement show slight increase in size. Over the next 2 weeks we will transition to every other day Santyl and steroid cream. He has been encouraged to monitor for changes and notify clinic with any concerns 12/15/17 on evaluation today patient's left lateral motion the ulcer and fortunately is doing worse again at this point. This just since last week to this week has close to doubled in size according to the patient. I did not seeing last week's I do not have a visual to compare this to in our system was also down so we do not have all the charts and at this point. Nonetheless it does have me somewhat concerned in regard to the fact that again he was worried enough about it he has contact the dermatology that placed them back on the full strength, 50 mg a day of the prednisone that he was taken previous. He continues to alternate using clobetasol along with Santyl at this point.  He is obviously somewhat frustrated. 12/22/17 on evaluation today patient appears to be doing a little worse compared to last evaluation. Unfortunately the wound is a little deeper and slightly larger than the last week's evaluation. With that being said he has made some progress in regard to the irritation surrounding at this time unfortunately despite that progress that's been made he still has a significant issue going on here. I'm not certain that he is having really any true infection at this time although with the Pyoderma gangrenosum it can Cupp, Jacobb ([id number]) sometimes be difficult to differentiate infection versus just inflammation. For that reason I discussed with him today the possibility of perform a wound culture to ensure there's nothing overtly infected. 01/06/18 on  evaluation today patient's wound is larger and deeper than previously evaluated. With that being said it did appear that his wound was infected after my last evaluation with him. Subsequently I did end up prescribing a prescription for Bactrim DS which she has been taking and having no complication with. Fortunately there does not appear to be any evidence of infection at this point in time as far as anything spreading, no want to touch, and overall I feel like things are showing signs of improvement. 01/13/18 on evaluation today patient appears to be even a little larger and deeper than last time. There still muscle exposed in the base of the wound. Nonetheless he does appear to be less erythematous I do believe inflammation is calming down also believe the infection looks like it's probably resolved at this time based on what I'm seeing. No fevers, chills, nausea, or vomiting noted at this time. 01/30/18 on evaluation today patient actually appears to visually look better for the most part. Unfortunately those visually this looks better he does seem to potentially have what may be an abscess in the muscle that has been noted in the central portion of the wound. This is the first time that I have noted what appears to be fluctuance in the central portion of the muscle. With that being said I'm somewhat more concerned about the fact that this might indicate an abscess formation at this location. I do believe that an ultrasound would be appropriate. This is likely something we need to try to do as soon as possible. He has been switch to mupirocin ointment and he is no longer using the steroid ointment as prescribed by dermatology he sees them again next week he's been decreased from 60 to 40 mg of prednisone. 03/09/18 on evaluation today patient actually appears to be doing a little better compared to last time I saw him. There's not as much erythema surrounding the wound itself. He I did review his  most recent infectious disease note which was dated 02/24/18. He saw Dr. Michel Bickers in [place]. 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 Patient is now off of antibiotics and they are continually observing this. There seems to be no change in the past two weeks in my pinion based on what the patient says and what I see today compared to what Dr. Megan Salon likely saw two weeks ago. No fevers, chills, nausea, or vomiting noted at this time. 03/23/18 on evaluation today patient's wound actually appears to be showing signs of improvement which is good news. He is currently still on the Dapsone. He is also working on tapering the prednisone to get off of this  and Dr. Lottie Rater is working with him in this regard. Nonetheless overall I feel like the wound is doing well it does appear based on the infectious disease note that I reviewed from Dr. 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 I did also review the results of his ultrasound of left lower extremity which revealed there was a dentist tissue in the base of the wound without an abscess noted. 04/06/18 on evaluation today the patient's left lateral lower extremity ulcer actually appears to be doing fairly well which is excellent news. There does not appear to be any evidence of infection at this time which is also great news. Overall he still does have a significantly large ulceration although little by little he seems to be making progress. He is down to 10 mg a day of the prednisone. 04/20/18 on evaluation today patient actually appears to be doing excellent at this time in regard to his left lower extremity ulcer. He's making signs of good progress unfortunately this is taking much longer than we would really like to see but nonetheless he is  making progress. Fortunately there does not appear to be any evidence of infection at this time. No fevers, chills, nausea, or vomiting noted at this time. The patient has not been using the Santyl due to the cost he hadn't got in this field yet. He's mainly been using the antibiotic ointment topically. Subsequently he also tells me that he really has not been scrubbing in the shower I think this would be helpful again as I told him it doesn't have to be anything too aggressive to even make it believe just enough to keep it free of some of the loose slough and biofilm on the wound surface. 05/11/18 on evaluation today patient's wound appears to be making slow but sure progress in regard to the left lateral lower extremity ulcer. He is been tolerating the dressing changes without complication. Fortunately there does not appear to be any evidence of infection at this time. He is still just using triple antibiotic ointment along with clobetasol occasionally over the area. He never got the Santyl and really does not seem to intend to in my pinion. 06/01/18 on evaluation today patient appears to be doing a little better in regard to his left lateral lower extremity ulcer. He states that overall he does not feel like he is doing as well with the Dapsone as he did with the prednisone. Nonetheless he sees his dermatologist later today and is gonna talk to them about the possibility of going back on the prednisone. Overall again I believe that the wound would be better if you would utilize Santyl but he really does not seem to be interested in going back to the [place] at this point. He has been using triple antibiotic ointment. JAFAR, POFFENBERGER ([id number]) 06/15/18 on evaluation today patient's wound actually appears to be doing about the same at this point. Fortunately there is no signs of infection at this time. He has made slight improvements although he continues to not really want to clean the wound bed at  this point. He states that he just doesn't mess with it he doesn't want to cause any problems with everything else he has going on. He has been on medication, antibiotics as prescribed by his dermatologist, for a staff infection of his lower extremities which is really drying out now  and looking much better he tells me. Fortunately there is no sign of overall infection. 06/29/18 on evaluation today patient appears to be doing well in regard to his left lateral lower surety ulcer all things considering. Fortunately his staff infection seems to be greatly improved compared to previous. He has no signs of infection and this is drying up quite nicely. He is still the doxycycline for this is no longer on cental, Dapsone, or any of the other medications. His dermatologist has recommended possibility of an infusion but right now he does not want to proceed with that. 07/13/18 on evaluation today patient appears to be doing about the same in regard to his left lateral lower surety ulcer. Fortunately there's no signs of infection at this time which is great news. Unfortunately he still builds up a significant amount of Slough/biofilm of the surface of the wound he still is not really cleaning this as he should be appropriately. Again I'm able to easily with saline and gauze remove the majority of this on the surface which if you would do this at home would likely be a dramatic improvement for him as far as getting the area to improve. Nonetheless overall I still feel like he is making progress is just very slow. I think Santyl will be of benefit for him as well. Still he has not gotten this as of this point. 07/27/18 on evaluation today patient actually appears to be doing little worse in regards of the erythema around the periwound region of the wound he also tells me that he's been having more drainage currently compared to what he was experiencing last time I saw him. He states not quite as bad as what he had  because this was infected previously but nonetheless is still appears to be doing poorly. Fortunately there is no evidence of systemic infection at this point. The patient tells me that he is not going to be able to afford the Santyl. He is still waiting to hear about the infusion therapy with his dermatologist. Apparently she wants an updated colonoscopy first. 08/10/18 on evaluation today patient appears to be doing better in regard to his left lateral lower extremity ulcer. Fortunately he is showing signs of improvement in this regard he's actually been approved for Remicade infusion's as well although this has not been scheduled as of yet. Fortunately there's no signs of active infection at this time in regard to the wound although he is having some issues with infection of the right lower extremity is been seen as dermatologist for this. Fortunately they are definitely still working with him trying to keep things under control. 09/07/18 on evaluation today patient is actually doing rather well in regard to his left lateral lower extremity ulcer. He notes these actually having some hair grow back on his extremity which is something he has not seen in years. He also tells me that the pain is really not giving them any trouble at this time which is also good news overall she is very pleased with the progress he's using a combination of the mupirocin along with the probate is all mixed. 09/21/18 on evaluation today patient actually appears to be doing fairly well all things considered in regard to his looks from the ulcer. He's been tolerating the dressing changes without complication. Fortunately there's no signs of active infection at this time which is good news he is still on all antibiotics or prevention of the staff infection. He has been on prednisone for time  although he states it is gonna contact his dermatologist and see if she put them on a short course due to some irritation that he has  going on currently. Fortunately there's no evidence of any overall worsening this is going very slow I think cental would be something that would be helpful for him although he states that $50 for tube is quite expensive. He therefore is not willing to get that at this point. 10/06/18 on evaluation today patient actually appears to be doing decently well in regard to his left lateral leg ulcer. He's been tolerating the dressing changes without complication. Fortunately there's no signs of active infection at this time. Overall I'm actually rather pleased with the progress he's making although it's slow he doesn't show any signs of infection and he does seem to be making some improvement. I do believe that he may need a switch up and dressings to try to help this to heal more appropriately and quickly. 10/19/18 on evaluation today patient actually appears to be doing better in regard to his left lateral lower extremity ulcer. This is shown signs of having much less Slough buildup at this point due to the fact he has been using the [employer]. Obviously this is very good news. The overall size of the wound is not dramatically smaller but again the appearance is. 11/02/18 on evaluation today patient actually appears to be doing quite well in regard to his lower [place] ulcer. A lot of the skin around the ulcer is actually somewhat irritating at this point this seems to be more due to the dressing causing irritation from the adhesive that anything else. Fortunately there is no signs of active infection at this time. 11/24/18 on evaluation today patient appears to be doing a little worse in regard to his overall appearance of his lower extremity Houdeshell, Offie ([id number]) ulcer. There's more erythema and warmth around the wound unfortunately. He is currently on doxycycline which he has been on for some time. With that being said I'm not sure that seems to be helping with what appears to possibly be an  acute cellulitis with regard to his left lower extremity ulcer. No fevers, chills, nausea, or vomiting noted at this time. 12/08/18 on evaluation today patient's wounds actually appears to be doing significantly better compared to his last evaluation. He has been using Santyl along with alternating tripling about appointment as well as the steroid cream seems to be doing quite well and the wound is showing signs of improvement which is excellent news. Fortunately there's no evidence of infection and in fact his culture came back negative with only normal skin flora noted. 12/21/2018 upon evaluation today patient actually appears to be doing excellent with regard to his ulcer. This is actually the best that I have seen it since have been helping to take care of him. It is both smaller as well as less slough noted on the surface of the wound and seems to be showing signs of good improvement with new skin growing from the edges. He has been using just the triamcinolone he does wonder if he can get a refill of that ointment today. 01/04/2019 upon evaluation today patient actually appears to be doing well with regard to his left lateral lower extremity ulcer. With that being said it does not appear to be that he is doing quite as well as last time as far as progression is concerned. There does not appear to be any signs of infection or significant irritation which  is good news. With that being said I do believe that he may benefit from switching to a collagen based dressing based on how clean The wound appears. 01/18/2019 on evaluation today patient actually appears to be doing well with regard to his wound on the left lower extremity. He is not made a lot of progress compared to where we were previous but nonetheless does seem to be doing okay at this time which is good news. There is no signs of active infection which is also good news. My only concern currently is I do wish we can get him into utilizing  the collagen dressing his insurance would not pay for the supplies that we ordered although it appears that he may be able to order this through his supply company that he typically utilizes. This is [employer]. Nonetheless he did try to order it during the office visit today and it appears this did go through. We will see if he can get that it is a different brand but nonetheless he has collagen and I do think will be beneficial. 02/01/2019 on evaluation today patient actually appears to be doing a little worse today in regard to the overall size of his wounds. Fortunately there is no signs of active infection at this time. That is visually. Nonetheless when this is happened before it was due to infection. For that reason were somewhat concerned about that this time as well. 02/08/2019 on evaluation today patient unfortunately appears to be doing slightly worse with regard to his wound upon evaluation today. Is measuring a little deeper and a little larger unfortunately. I am not really sure exactly what is causing this to enlarge he actually did see his dermatologist she is going to see about initiating Humira for him. Subsequently she also did do steroid injections into the wound itself in the periphery. Nonetheless still nonetheless he seems to be getting a little bit larger he is gone back to just using the steroid cream topically which I think is appropriate. I would say hold off on the collagen for the time being is definitely a good thing to do. Based on the culture results which we finally did get the final result back regarding it shows staph as the bacteria noted again that can be a normal skin bacteria based on the fact however he is having increased drainage and worsening of the wound measurement wise I would go ahead and place him on an antibiotic today I do believe for this. 02/15/2019 on evaluation today patient actually appears to be doing somewhat better in regard to his ulcer. There is  no signs of worsening at this time I did review his culture results which showed evidence of Staphylococcus aureus but not MRSA. Again this could just be more related to the normal skin bacteria although he states the drainage has slowed down quite a bit he may have had a mild infection not just colonization. Patient History Information obtained from Patient. Family History Diabetes - Mother,Maternal Grandparents, Heart Disease - Mother,Maternal Grandparents, Hereditary Spherocytosis - Siblings, Hypertension - Maternal Grandparents, Stroke - Siblings, No family history of Cancer, Kidney Disease, Lung Disease, Seizures, Thyroid Problems, Tuberculosis. Social History Current some day smoker, Marital Status - Married, Alcohol Use - Rarely, Drug Use - No History, Caffeine Use - Moderate. Medical History Eyes EARON, RIVEST ([id number]) Denies history of Cataracts, Glaucoma, Optic Neuritis Ear/Nose/Mouth/Throat Denies history of Chronic sinus problems/congestion, Middle ear problems Hematologic/Lymphatic Denies history of Anemia, Hemophilia, Human Immunodeficiency Virus, Lymphedema, Sickle  Cell Disease Respiratory Patient has history of Sleep Apnea - cpap Denies history of Aspiration, Asthma, Chronic Obstructive Pulmonary Disease (COPD), Pneumothorax Cardiovascular Patient has history of Hypertension Denies history of Angina, Arrhythmia, Congestive Heart Failure, Coronary Artery Disease, Deep Vein Thrombosis, Hypotension, Myocardial Infarction, Peripheral Arterial Disease, Peripheral Venous Disease, Phlebitis, Vasculitis Gastrointestinal Patient has history of Colitis Endocrine Denies history of Type I Diabetes, Type II Diabetes Genitourinary Denies history of End Stage Renal Disease Immunological Denies history of Lupus Erythematosus, Raynaud s, Scleroderma Integumentary (Skin) Denies history of History of Burn, History of pressure wounds Musculoskeletal Denies history of Gout,  Rheumatoid Arthritis, Osteoarthritis, Osteomyelitis Neurologic Denies history of Dementia, Neuropathy, Quadriplegia, Paraplegia, Seizure Disorder Oncologic Denies history of Received Chemotherapy, Received Radiation Psychiatric Denies history of Anorexia/bulimia, Confinement Anxiety Review of Systems (ROS) Constitutional Symptoms (General Health) Denies complaints or symptoms of Fatigue, Fever, Chills, Marked Weight Change. Respiratory Denies complaints or symptoms of Chronic or frequent coughs, Shortness of Breath. Cardiovascular Complains or has symptoms of LE edema. Denies complaints or symptoms of Chest pain. Psychiatric Denies complaints or symptoms of Anxiety, Claustrophobia. Objective Constitutional Obese and well-hydrated in no acute distress. Vitals Time Taken: 12:43 PM, Height: 71 in, Weight: 338 lbs, BMI: 47.1, Temperature: 98.6 F, Pulse: 75 bpm, Respiratory Rate: 16 breaths/min, Blood Pressure: 132/73 mmHg. Raynor, Chrisangel ([id number]) Respiratory normal breathing without difficulty. clear to auscultation bilaterally. Cardiovascular regular rate and rhythm with normal S1, S2. Psychiatric this patient is able to make decisions and demonstrates good insight into disease process. Alert and Oriented x 3. pleasant and cooperative. General Notes: Patient's wound bed currently showed signs of good granulation at this time there was some slough noted but fortunately not even as thick as noted last week. I do believe the Augmentin has been on him some good at this point based on what I am seeing. Fortunately there is no signs of active infection at this time. No fevers, chills, nausea, vomiting, or diarrhea. Integumentary (Hair, Skin) Wound #1 status is Open. Original cause of wound was Gradually Appeared. The wound is located on the Left,Lateral Lower Leg. The wound measures 3.6cm length x 2.8cm width x 0.3cm depth; 7.917cm^2 area and 2.375cm^3 volume. There is Fat Layer  (Subcutaneous Tissue) Exposed exposed. There is no tunneling or undermining noted. There is a medium amount of serous drainage noted. The wound margin is distinct with the outline attached to the wound base. There is small (1-33%) red granulation within the wound bed. There is a large (67-100%) amount of necrotic tissue within the wound bed including Adherent Slough. 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 Anesthetic (add to Medication List): Wound #1 Left,Lateral Lower Leg: Topical Lidocaine 4% cream applied to wound bed prior to debridement (In Clinic Only). Primary Wound Dressing: Wound #1 Left,Lateral Lower Leg: Other: - TCA to wound bed only Secondary Dressing: Wound #1 Left,Lateral Lower Leg: Boardered Foam Dressing Dressing Change Frequency: Wound #1 Left,Lateral Lower Leg: Change dressing every other day. SHYLER, HOLZMAN ([id number]) Follow-up Appointments: Wound #1 Left,Lateral Lower Leg: Return Appointment in 1 week. Edema Control: Wound #1 Left,Lateral Lower Leg: Elevate legs to the level of the heart and pump ankles as often as possible 1 I would recommend currently that we continue with the Augmentin finishes all for the last 7 days that he still has at this time and the patient is  in agreement with the plan. 2. I am going also suggest that we go ahead and continue with the triamcinolone as this seems to be doing better as far as helping with the inflammation of the wound. He likewise is in agreement with this as well. We will see patient back for reevaluation in 1 week here in the clinic. If anything worsens or changes patient will contact our office for additional recommendations. Electronic Signature(s) Signed: 02/15/2019 1:00:45 PM By: Worthy Keeler  PA-C Entered By: Worthy Keeler on 02/15/2019 13:00:45 DEAVON, PODGORSKI ([id number]) -------------------------------------------------------------------------------- ROS/PFSH Details Patient Name: [name] Date of Service: 02/15/2019 12:30 PM Medical Record Number: [id number] Patient Account Number: [id number] Date of Birth/Sex: 09-Jan-1981 (38 y.o. M) Treating RN: Harold Barban Primary Care Provider: [name] Other Clinician: Referring Provider: [name] Treating Provider/Extender: Melburn Hake, Maricel Swartzendruber Weeks in Treatment: 114 Information Obtained From Patient Constitutional Symptoms (General Health) Complaints and Symptoms: Negative for: Fatigue; Fever; Chills; Marked Weight Change Respiratory Complaints and Symptoms: Negative for: Chronic or frequent coughs; Shortness of Breath Medical History: Positive for: Sleep Apnea - cpap Negative for: Aspiration; Asthma; Chronic Obstructive Pulmonary Disease (COPD); Pneumothorax Cardiovascular Complaints and Symptoms: Positive for: LE edema Negative for: Chest pain Medical History: Positive for: Hypertension Negative for: Angina; Arrhythmia; Congestive Heart Failure; Coronary Artery Disease; Deep Vein Thrombosis; Hypotension; Myocardial Infarction; Peripheral Arterial Disease; Peripheral Venous Disease; Phlebitis; Vasculitis Psychiatric Complaints and Symptoms: Negative for: Anxiety; Claustrophobia Medical History: Negative for: Anorexia/bulimia; Confinement Anxiety Eyes Medical History: Negative for: Cataracts; Glaucoma; Optic Neuritis Ear/Nose/Mouth/Throat Medical History: Negative for: Chronic sinus problems/congestion; Middle ear problems Hematologic/Lymphatic Medical History: Negative for: Anemia; Hemophilia; Human Immunodeficiency Virus; Lymphedema; Sickle Cell Disease Teicher, Algis ([id number]) Gastrointestinal Medical History: Positive for: Colitis Endocrine Medical History: Negative for: Type I Diabetes;  Type II Diabetes Genitourinary Medical History: Negative for: End Stage Renal Disease Immunological Medical History: Negative for: Lupus Erythematosus; Raynaudos; Scleroderma Integumentary (Skin) Medical History: Negative for: History of Burn; History of pressure wounds Musculoskeletal Medical History: Negative for: Gout; Rheumatoid Arthritis; Osteoarthritis; Osteomyelitis Neurologic Medical History: Negative for: Dementia; Neuropathy; Quadriplegia; Paraplegia; Seizure Disorder Oncologic Medical History: Negative for: Received Chemotherapy; Received Radiation Immunizations Pneumococcal Vaccine: Received Pneumococcal Vaccination: No Implantable Devices No devices added Family and Social History Cancer: No; Diabetes: Yes - Mother,Maternal Grandparents; Heart Disease: Yes - Mother,Maternal Grandparents; Hereditary Spherocytosis: Yes - Siblings; Hypertension: Yes - Maternal Grandparents; Kidney Disease: No; Lung Disease: No; Seizures: No; Stroke: Yes - Siblings; Thyroid Problems: No; Tuberculosis: No; Current some day smoker; Marital Status - Married; Alcohol Use: Rarely; Drug Use: No History; Caffeine Use: Moderate; Financial Concerns: No; Food, Clothing or Shelter Needs: No; Support System Lacking: No; Transportation Concerns: No Physician Affirmation I have reviewed and agree with the above information. Electronic Signature(s) PENNY, FRISBIE ([id number]) Signed: 02/15/2019 5:27:38 PM By: Harold Barban Signed: 02/15/2019 6:24:05 PM By: Worthy Keeler PA-C Entered By: Worthy Keeler on 02/15/2019 13:00:08 KHALEL, ALMS ([id number]) -------------------------------------------------------------------------------- SuperBill Details Patient Name: [name] Date of Service: 02/15/2019 Medical Record Number: [id number] Patient Account Number: [id number] Date of Birth/Sex: 21-Nov-1980 (38 y.o. M) Treating RN: Harold Barban Primary Care Provider: [name] Other  Clinician: Referring Provider: [name] Treating Provider/Extender: Melburn Hake, Wanell Lorenzi Weeks in Treatment: 114 Diagnosis Coding ICD-10 Codes Code Description [phone] Non-pressure chronic ulcer of left calf with fat layer exposed L88 Pyoderma gangrenosum I87.2 Venous insufficiency (chronic) (peripheral) Facility Procedures CPT4 Code: [id number] Description: 99213 - WOUND CARE VISIT-LEV 3 EST PT Modifier: Quantity: 1 Physician Procedures CPT4 Code:  [id number] Description: 99214 - WC PHYS LEVEL 4 - EST PT ICD-10 Diagnosis Description L97.222 Non-pressure chronic ulcer of left calf with fat layer expo L88 Pyoderma gangrenosum I87.2 Venous insufficiency (chronic) (peripheral) Modifier: sed Quantity: 1 Electronic Signature(s) Signed: 02/15/2019 1:01:07 PM By: Worthy Keeler PA-C Entered By: Worthy Keeler on 02/15/2019 13:01:07

## 2019-02-15 NOTE — Progress Notes (Addendum)
[name] ([id number]) Visit Report for 02/15/2019 Arrival Information Details Patient Name: [name], [name] Date of Service: 02/15/2019 12:30 PM Medical Record Number: [id number] Patient Account Number: [id number] Date of Birth/Sex: 1980-07-08 (38 y.o. M) Treating RN: Army Melia Primary Care Treavon Castilleja: [name] Other Clinician: Referring Jacqeline Broers: [name] Treating Analyah Mcconnon/Extender: Melburn Hake, [name]: 55 Visit Information History Since Last Visit Added or deleted any medications: No Patient Arrived: Ambulatory Any new allergies or adverse reactions: No Arrival Time: 12:39 Had a fall or experienced change in No Accompanied By: self activities of daily living that may affect Transfer Assistance: None risk of falls: Patient Identification Verified: Yes Signs or symptoms of abuse/neglect since last visito No Patient Requires Transmission-Based No Hospitalized since last visit: No Precautions: Has Dressing in Place as Prescribed: Yes Patient Has Alerts: No Pain Present Now: No Electronic Signature(s) Signed: 02/15/2019 12:45:59 PM By: Army Melia Entered By: Army Melia on 02/15/2019 12:39:50 [name] ([id number]) -------------------------------------------------------------------------------- Clinic Level of Care Assessment Details Patient Name: [name] Date of Service: 02/15/2019 12:30 PM Medical Record Number: [id number] Patient Account Number: [id number] Date of Birth/Sex: 08-Apr-1981 (38 y.o. M) Treating RN: Harold Barban Primary Care Riyanna Crutchley: [name] Other Clinician: Referring Samayah Novinger: [name] Treating Garyn Waguespack/Extender: Melburn Hake, [name]: [place] [facility] Level of Care Assessment Items TOOL 4 Quantity Score []  - Use when only an EandM is performed on FOLLOW-UP visit 0 ASSESSMENTS - Nursing Assessment / Reassessment X - Reassessment of Co-morbidities (includes updates in patient status) 1 10 X-  1 5 Reassessment of Adherence to Treatment Plan ASSESSMENTS - Wound and Skin Assessment / Reassessment X - Simple Wound Assessment / Reassessment - one wound 1 5 []  - 0 Complex Wound Assessment / Reassessment - multiple wounds []  - 0 Dermatologic / Skin Assessment (not related to wound area) ASSESSMENTS - Focused Assessment []  - Circumferential Edema Measurements - multi extremities 0 []  - 0 Nutritional Assessment / Counseling / Intervention []  - 0 Lower Extremity Assessment (monofilament, tuning fork, pulses) []  - 0 Peripheral Arterial Disease Assessment (using hand held doppler) ASSESSMENTS - Ostomy and/or Continence Assessment and Care []  - Incontinence Assessment and Management 0 []  - 0 Ostomy Care Assessment and Management (repouching, etc.) PROCESS - Coordination of Care X - Simple Patient / Family Education for ongoing care 1 15 []  - 0 Complex (extensive) Patient / Family Education for ongoing care []  - 0 Staff obtains Programmer, systems, Records, Test Results / Process Orders []  - 0 Staff telephones HHA, Nursing Homes / Clarify orders / etc []  - 0 Routine Transfer to another Facility (non-emergent condition) []  - 0 Routine Hospital Admission (non-emergent condition) []  - 0 New Admissions / Biomedical engineer / Ordering NPWT, Apligraf, etc. []  - 0 Emergency Hospital Admission (emergent condition) X- 1 10 Simple Discharge Coordination Rookstool, Dondrell ([id number]) []  - 0 Complex (extensive) Discharge Coordination PROCESS - Special Needs []  - Pediatric / Minor Patient Management 0 []  - 0 Isolation Patient Management []  - 0 Hearing / Language / Visual special needs []  - 0 Assessment of Community assistance (transportation, D/C planning, etc.) []  - 0 Additional assistance / Altered mentation []  - 0 Support Surface(s) Assessment (bed, cushion, seat, etc.) INTERVENTIONS - Wound Cleansing / Measurement X - Simple Wound Cleansing - one wound 1 5 []  - 0 Complex Wound  Cleansing - multiple wounds X- 1 5 Wound Imaging (photographs - any number of wounds) []  - 0 Wound Tracing (instead of photographs) X- 1 5 Simple Wound Measurement -  one wound []  - 0 Complex Wound Measurement - multiple wounds INTERVENTIONS - Wound Dressings X - Small Wound Dressing one or multiple wounds 1 10 []  - 0 Medium Wound Dressing one or multiple wounds []  - 0 Large Wound Dressing one or multiple wounds X- 1 5 Application of Medications - topical []  - 0 Application of Medications - injection INTERVENTIONS - Miscellaneous []  - External ear exam 0 []  - 0 Specimen Collection (cultures, biopsies, blood, body fluids, etc.) []  - 0 Specimen(s) / Culture(s) sent or taken to Lab for analysis []  - 0 Patient Transfer (multiple staff / Civil Service fast streamer / Similar devices) []  - 0 Simple Staple / Suture removal (25 or less) []  - 0 Complex Staple / Suture removal (26 or more) []  - 0 Hypo / Hyperglycemic Management (close monitor of Blood Glucose) []  - 0 Ankle / Brachial Index (ABI) - do not check if billed separately X- 1 5 Vital Signs Paola, [place] ([id number]) Has the patient been seen at the hospital within the last three years: Yes Total Score: 80 Level Of Care: New/Established - Level 3 Electronic Signature(s) Signed: 02/15/2019 5:27:38 PM By: Harold Barban Entered By: Harold Barban on 02/15/2019 12:54:48 [name] ([id number]) -------------------------------------------------------------------------------- Encounter Discharge Information Details Patient Name: [name] Date of Service: 02/15/2019 12:30 PM Medical Record Number: [id number] Patient Account Number: [id number] Date of Birth/Sex: 1980-11-14 (38 y.o. M) Treating RN: Harold Barban Primary Care Jammal Sarr: [name] Other Clinician: Referring Koehn Salehi: [name] Treating Javier Mamone/Extender: Melburn Hake, [name]: 114 Encounter Discharge Information Items Discharge Condition:  Stable Ambulatory Status: Ambulatory Discharge Destination: Home Transportation: Private Auto Accompanied By: self Schedule Follow-up Appointment: Yes Clinical Summary of Care: Electronic Signature(s) Signed: 02/15/2019 5:27:38 PM By: Harold Barban Entered By: Harold Barban on 02/15/2019 12:58:45 [name] ([id number]) -------------------------------------------------------------------------------- Lower Extremity Assessment Details Patient Name: [name] Date of Service: 02/15/2019 12:30 PM Medical Record Number: [id number] Patient Account Number: [id number] Date of Birth/Sex: 09/04/1980 (38 y.o. M) Treating RN: Army Melia Primary Care Siria Calandro: [name] Other Clinician: Referring Dodi Leu: [name] Treating Boyde Grieco/Extender: Melburn Hake, [name]: 114 Edema Assessment Assessed: [Left: No] [Right: No] Edema: [Left: Yes] [Right: No] Calf Left: Right: Point of Measurement: 34 cm From Medial Instep 51 cm cm Ankle Left: Right: Point of Measurement: 12 cm From Medial Instep 33 cm cm Vascular Assessment Pulses: Dorsalis Pedis Palpable: [Left:Yes] Electronic Signature(s) Signed: 02/15/2019 12:45:59 PM By: Army Melia Entered By: Army Melia on 02/15/2019 12:42:56 Kruschke, [name] ([id number]) -------------------------------------------------------------------------------- Multi Wound Chart Details Patient Name: [name] Date of Service: 02/15/2019 12:30 PM Medical Record Number: [id number] Patient Account Number: [id number] Date of Birth/Sex: 01/12/1981 (38 y.o. M) Treating RN: Harold Barban Primary Care Nil Bolser: [name] Other Clinician: Referring Dawt Reeb: [name] Treating Raigen Jagielski/Extender: Melburn Hake, [name]: 114 Vital Signs Height(in): 71 Pulse(bpm): 75 Weight(lbs): 338 Blood Pressure(mmHg): 132/73 Body Mass Index(BMI): 47 Temperature(F): 98.6 Respiratory  Rate 16 (breaths/min): Photos: [N/A:N/A] Wound Location: Left Lower Leg - Lateral N/A N/A Wounding Event: Gradually Appeared N/A N/A Primary Etiology: Pyoderma N/A N/A Comorbid History: Sleep Apnea, Hypertension, N/A N/A Colitis Date Acquired: 11/18/2015 N/A N/A Weeks of Treatment: 114 N/A N/A Wound Status: Open N/A N/A Measurements L x W x D 3.9x2.2x0.4 N/A N/A (cm) Area (cm) : 6.739 N/A N/A Volume (cm) : 2.695 N/A N/A % Reduction in Area: -37.30% N/A N/A % Reduction in Volume: 31.40% N/A N/A Classification: Full Thickness With Exposed N/A N/A Support Structures Exudate Amount: Medium N/A  N/A Exudate Type: Serous N/A N/A Exudate Color: amber N/A N/A Wound Margin: Distinct, outline attached N/A N/A Granulation Amount: Small (1-33%) N/A N/A Granulation Quality: Red N/A N/A Necrotic Amount: Large (67-100%) N/A N/A Exposed Structures: Fat Layer (Subcutaneous N/A N/A Tissue) Exposed: Yes Fascia: No Tendon: No Muscle: No Helin, Yonis ([id number]) Joint: No Bone: No Epithelialization: None N/A N/A Treatment Notes Electronic Signature(s) Signed: 02/15/2019 5:27:38 PM By: Harold Barban Entered By: Harold Barban on 02/15/2019 12:53:18 [name] ([id number]) -------------------------------------------------------------------------------- Multi-Disciplinary Care Plan Details Patient Name: [name] Date of Service: 02/15/2019 12:30 PM Medical Record Number: [id number] Patient Account Number: [id number] Date of Birth/Sex: 1980/09/09 (38 y.o. M) Treating RN: Harold Barban Primary Care Rheba Diamond: [name] Other Clinician: Referring Rosann Gorum: [name] Treating Caitlen Worth/Extender: Melburn Hake, [name]: 114 Active Inactive Orientation to the Wound Care Program Nursing Diagnoses: Knowledge deficit related to the wound healing center program Goals: Patient/caregiver will verbalize understanding of the [place] Program Date  Initiated: 12/11/2016 Target Resolution Date: 03/13/2017 Goal Status: Active Interventions: Provide education on orientation to the wound center Notes: Venous Leg Ulcer Nursing Diagnoses: Knowledge deficit related to disease process and management Potential for venous Insuffiency (use before diagnosis confirmed) Goals: Non-invasive venous studies are completed as ordered Date Initiated: 12/11/2016 Target Resolution Date: 03/13/2017 Goal Status: Active Patient will maintain optimal edema control Date Initiated: 12/11/2016 Target Resolution Date: 03/13/2017 Goal Status: Active Patient/caregiver will verbalize understanding of disease process and disease management Date Initiated: 12/11/2016 Target Resolution Date: 03/13/2017 Goal Status: Active Verify adequate tissue perfusion prior to therapeutic compression application Date Initiated: 12/11/2016 Target Resolution Date: 03/13/2017 Goal Status: Active Interventions: Assess peripheral edema status every visit. Compression as ordered Provide education on venous insufficiency Treatment Activities: JOS, CYGAN ([id number]) Therapeutic compression applied : 12/11/2016 Notes: Wound/Skin Impairment Nursing Diagnoses: Impaired tissue integrity Knowledge deficit related to ulceration/compromised skin integrity Goals: Patient/caregiver will verbalize understanding of skin care regimen Date Initiated: 12/11/2016 Target Resolution Date: 03/13/2017 Goal Status: Active Ulcer/skin breakdown will have a volume reduction of 30% by week 4 Date Initiated: 12/11/2016 Target Resolution Date: 03/13/2017 Goal Status: Active Ulcer/skin breakdown will have a volume reduction of 50% by week 8 Date Initiated: 12/11/2016 Target Resolution Date: 03/13/2017 Goal Status: Active Ulcer/skin breakdown will have a volume reduction of 80% by week 12 Date Initiated: 12/11/2016 Target Resolution Date: 03/13/2017 Goal Status: Active Ulcer/skin breakdown will  heal within 14 weeks Date Initiated: 12/11/2016 Target Resolution Date: 03/13/2017 Goal Status: Active Interventions: Assess patient/caregiver ability to obtain necessary supplies Assess patient/caregiver ability to perform ulcer/skin care regimen upon admission and as needed Assess ulceration(s) every visit Provide education on ulcer and skin care Treatment Activities: Skin care regimen initiated : 12/11/2016 Topical wound management initiated : 12/11/2016 Notes: Electronic Signature(s) Signed: 02/15/2019 5:27:38 PM By: Harold Barban Entered By: Harold Barban on 02/15/2019 12:53:08 [name] ([id number]) -------------------------------------------------------------------------------- Pain Assessment Details Patient Name: [name] Date of Service: 02/15/2019 12:30 PM Medical Record Number: [id number] Patient Account Number: [id number] Date of Birth/Sex: 1980-09-04 (38 y.o. M) Treating RN: Army Melia Primary Care Joevon Holliman: [name] Other Clinician: Referring Laiklyn Pilkenton: [name] Treating Towanna Avery/Extender: Melburn Hake, [name]: 114 Active Problems Location of Pain Severity and Description of Pain Patient Has Paino No Site Locations Pain Management and Medication Current Pain Management: Electronic Signature(s) Signed: 02/15/2019 12:45:59 PM By: Army Melia Entered By: Army Melia on 02/15/2019 12:39:56 [name] ([id number]) -------------------------------------------------------------------------------- Patient/Caregiver Education Details Patient Name: [name] Date of Service: 02/15/2019 12:30 PM  Medical Record Number: [id number] Patient Account Number: [id number] Date of Birth/Gender: February 19, 1981 (38 y.o. M) Treating RN: Harold Barban Primary Care Physician: [name] Other Clinician: Referring Physician: [name] Treating Physician/Extender: Sharalyn Ink in Treatment: 114 Education  Assessment Education Provided To: Patient Education Topics Provided Venous: Handouts: Controlling Swelling with Compression Stockings Methods: Demonstration, Explain/Verbal Responses: State content correctly Wound/Skin Impairment: Handouts: Caring for Your Ulcer Methods: Demonstration, Explain/Verbal Responses: State content correctly Electronic Signature(s) Signed: 02/15/2019 5:27:38 PM By: Harold Barban Entered By: Harold Barban on 02/15/2019 12:53:47 [name] ([id number]) -------------------------------------------------------------------------------- Wound Assessment Details Patient Name: [name] Date of Service: 02/15/2019 12:30 PM Medical Record Number: [id number] Patient Account Number: [id number] Date of Birth/Sex: [birth year] (38 y.o. M) Treating RN: Harold Barban Primary Care Onica Davidovich: [name] Other Clinician: Referring Jada Fass: [name] Treating Breklyn Fabrizio/Extender: Melburn Hake, [name]: 114 Wound Status Wound Number: 1 Primary Etiology: Pyoderma Wound Location: Left, Lateral Lower Leg Wound Status: Open Wounding Event: Gradually Appeared Comorbid History: Sleep Apnea, Hypertension, Colitis Date Acquired: 11/18/2015 Weeks Of Treatment: 114 Clustered Wound: No Photos Wound Measurements Length: (cm) 3.6 Width: (cm) 2.8 Depth: (cm) 0.3 Area: (cm) 7.917 Volume: (cm) 2.375 % Reduction in Area: -61.3% % Reduction in Volume: 39.5% Epithelialization: None Tunneling: No Undermining: No Wound Description Full Thickness With Exposed Support Foul Odor Classification: Structures Slough/Fib Wound Margin: Distinct, outline attached Exudate Medium Amount: Exudate Type: Serous Exudate Color: amber After Cleansing: No rino Yes Wound Bed Granulation Amount: Small (1-33%) Exposed Structure Granulation Quality: Red Fascia Exposed: No Necrotic Amount: Large (67-100%) Fat Layer (Subcutaneous Tissue) Exposed: Yes Necrotic  Quality: Adherent Slough Tendon Exposed: No Muscle Exposed: No Joint Exposed: No Bone Exposed: No Markwood, Richardo ([id number]) Treatment Notes Wound #1 (Left, Lateral Lower Leg) Notes TCA to wound bed only, BFD Electronic Signature(s) Signed: 02/15/2019 5:27:38 PM By: Harold Barban Previous Signature: 02/15/2019 12:45:59 PM Version By: Army Melia Entered By: Harold Barban on 02/15/2019 12:55:46 [name] ([id number]) -------------------------------------------------------------------------------- Vitals Details Patient Name: [name] Date of Service: 02/15/2019 12:30 PM Medical Record Number: [id number] Patient Account Number: [id number] Date of Birth/Sex: 12-15-1980 (38 y.o. M) Treating RN: Army Melia Primary Care Concha Sudol: [name] Other Clinician: Referring Terica [name]: [name] Treating Zyah Gomm/Extender: Melburn Hake, [name]: 114 Vital Signs Time Taken: 12:43 Temperature (F): 98.6 Height (in): 71 Pulse (bpm): 75 Weight (lbs): 338 Respiratory Rate (breaths/min): 16 Body Mass Index (BMI): 47.1 Blood Pressure (mmHg): 132/73 Reference Range: 80 - 120 mg / dl Electronic Signature(s) Signed: 02/15/2019 12:45:59 PM By: Army Melia Entered By: Army Melia on 02/15/2019 12:44:26

## 2019-02-18 ENCOUNTER — Ambulatory Visit: Payer: 59 | Admitting: Pulmonary Disease

## 2019-02-19 DIAGNOSIS — K51919 Ulcerative colitis, unspecified with unspecified complications: Secondary | ICD-10-CM

## 2019-02-19 DIAGNOSIS — L88 Pyoderma gangrenosum: Secondary | ICD-10-CM

## 2019-02-19 MED ORDER — ADALIMUMAB 40 MG/0.8 ML SUBCUTANEOUS PEN KIT
SUBCUTANEOUS | 11 refills | 28.00000 days | Status: CP
Start: 2019-02-19 — End: 2020-02-19

## 2019-02-22 ENCOUNTER — Encounter: Payer: 59 | Attending: Physician Assistant | Admitting: Physician Assistant

## 2019-02-22 DIAGNOSIS — F17200 Nicotine dependence, unspecified, uncomplicated: Secondary | ICD-10-CM | POA: Insufficient documentation

## 2019-02-22 DIAGNOSIS — L88 Pyoderma gangrenosum: Secondary | ICD-10-CM | POA: Insufficient documentation

## 2019-02-22 DIAGNOSIS — G4730 Sleep apnea, unspecified: Secondary | ICD-10-CM | POA: Insufficient documentation

## 2019-02-22 DIAGNOSIS — I872 Venous insufficiency (chronic) (peripheral): Secondary | ICD-10-CM | POA: Insufficient documentation

## 2019-02-22 DIAGNOSIS — I10 Essential (primary) hypertension: Secondary | ICD-10-CM | POA: Insufficient documentation

## 2019-02-22 DIAGNOSIS — L97222 Non-pressure chronic ulcer of left calf with fat layer exposed: Secondary | ICD-10-CM | POA: Insufficient documentation

## 2019-03-01 ENCOUNTER — Encounter: Payer: 59 | Attending: Physician Assistant | Admitting: Physician Assistant

## 2019-03-01 ENCOUNTER — Other Ambulatory Visit: Payer: Self-pay

## 2019-03-01 DIAGNOSIS — L080 Pyoderma: Principal | ICD-10-CM

## 2019-03-01 DIAGNOSIS — L97222 Non-pressure chronic ulcer of left calf with fat layer exposed: Secondary | ICD-10-CM | POA: Insufficient documentation

## 2019-03-01 DIAGNOSIS — L88 Pyoderma gangrenosum: Secondary | ICD-10-CM | POA: Diagnosis not present

## 2019-03-01 DIAGNOSIS — I872 Venous insufficiency (chronic) (peripheral): Secondary | ICD-10-CM | POA: Insufficient documentation

## 2019-03-01 NOTE — Progress Notes (Addendum)
KARY, COLAIZZI ([id number]) Visit Report for 03/01/2019 Chief Complaint Document Details Patient Name: [name] Date of Service: 03/01/2019 8:30 AM Medical Record Number: [id number] Patient Account Number: [id number] Date of Birth/Sex: 1980/12/25 (38 y.o. 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 M) Treating RN: Harold Barban Primary Care Provider: [name] Other Clinician: Referring Provider: [name] Treating Provider/Extender: Melburn Hake, Marcine Gadway Weeks in Treatment: 116 History of Present Illness HPI Description: 12/04/16; 38 year old man who comes into the clinic today for review of a wound on the posterior left calf. He tells me that is been there for about a year. He is not a diabetic he does smoke half a pack per day. He was seen in the ER on 11/20/16 felt to have cellulitis around the wound and was given clindamycin. An x-ray did not show osteomyelitis. The patient initially tells me that he has a milk allergy that sets off a pruritic itching rash on his lower legs which she scratches incessantly and he thinks that's what may have set up the wound. He has been using various topical antibiotics and ointments without any effect. He works in a [employer] and is on his feet all day. He does not  have a prior history of wounds however he does have the rash on both lower legs the right arm and the ventral aspect of his left arm. These are excoriations and clearly have had scratching however there are of macular looking areas on both legs including a substantial larger area on the right leg. This does not have an underlying open area. There is no blistering. The patient tells me that 2 years ago in [place] in response to the rash on his legs he saw a dermatologist who told him he had a condition which may be pyoderma gangrenosum although I may be putting words into his mouth. He seemed to recognize this. 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 He is not in any treatment for this. He's had no recent travel 12/11/16; the patient arrives today with his wound and roughly the same condition we've been using silver alginate this is a deep punched out wound with some surrounding erythema but no tenderness. Biopsy I did did not show confirmed pyoderma gangrenosum suggested nonspecific inflammation and vasculitis but does not provide an actual description of what was seen by the pathologist. I'm really not able to understand this We have also received information from the patient's dermatologist in [place] notes from April 2016. This was a doctor Agarwal- antal. 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 He was prescribed topical steroid high potency under occlusion which helped but at this point the patient did not have a deep punched out wound. 12/18/16; the patient's wound is larger in terms of surface area however this surface looks better and there is less depth. The surrounding erythema also is better. The patient states that the wrap we put on came off 2 days ago when he has been using  his compression stockings. He we are in the process of getting a dermatology consult. 12/26/16 on evaluation today patient's left lower extremity wound shows  evidence of infection with surrounding erythema noted. He has been tolerating the dressing changes but states that he has noted more discomfort. There is a larger area of erythema surrounding the wound. No fevers, chills, nausea, or vomiting noted at this time. With that being said the wound still does have slough covering the surface. He is not allergic to any medication that he is aware of at this point. In regard to his right lower extremity he had several regions that are erythematous and pruritic he wonders if there's anything we can do to help that. 01/02/17 I reviewed patient's wound culture which was obtained his visit last week. He was placed on doxycycline at that point. Unfortunately that does not appear to be an antibiotic that would likely help with the situation however the pseudomonas noted on culture is sensitive to Cipro. Also unfortunately patient's wound seems to have a large compared to last week's evaluation. Not severely so but there are definitely increased measurements in general. He is continuing to have discomfort as well he writes this to be a seven out of 10. In fact he would prefer me not to perform any debridement today due to the fact that he is having discomfort and considering he has an active infection on the little reluctant to do so anyway. No fevers, chills, nausea, or vomiting noted at this time. 01/08/17; patient seems dermatology on September 5. I suspect dermatology will want the slides from the biopsy I did sent to their pathologist. I'm not sure if there is a way we can expedite that. In any case the culture I did before I left on vacation 3 weeks ago showed Pseudomonas he was given 10 days of Cipro and per her description of her intake nurses is actually somewhat better this week although the wound is quite a bit bigger than I remember the last time I saw this. He still has 3 more days of Cipro CRIST, KRUSZKA ([id number]) 01/21/17; dermatology appointment  tomorrow. He has completed the ciprofloxacin for Pseudomonas. Surface of the wound looks better however he is had some deterioration in the lesions on his right leg. Meantime the left lateral leg wound we will continue with sample 01/29/17; patient had his dermatology appointment but I can't yet see that note. He is completed his antibiotics. The wound is more superficial but considerably larger in circumferential area than when he came in. This is in his left lateral calf. He also has swollen erythematous areas with superficial wounds on the right leg and small papular areas on both arms. There apparently areas in her his upper thighs and buttocks I did not look at those. Dermatology biopsied the right leg. Hopefully will have their input next week. 02/05/17; patient went back to see his dermatologist who told him that he had a "scratching problem" as well as staph. He is now on a 30 day course of doxycycline and I believe she gave him triamcinolone cream to the right leg areas to help with the itching [not exactly sure but probably triamcinolone]. She apparently looked at the left lateral leg wound although this was not rebiopsied and I think felt to be ultimately part of the same pathogenesis. He is using sample border foam and changing nevus himself. He now has a new open area on the right posterior leg which was his biopsy site I don't  have any of the dermatology notes 02/12/17; we put the patient in compression last week with SANTYL to the wound on the left leg and the biopsy. Edema is much better and the depth of the wound is now at level of skin. Area is still the same oBiopsy site on the right lateral leg we've also been using santyl with a border foam dressing and he is changing this himself. 02/19/17; Using silver alginate started last week to both the substantial left leg wound and the biopsy site on the right wound. He is tolerating compression well. Has a an appointment with his primary  M.D. tomorrow wondering about diuretics although I'm wondering if the edema problem is actually lymphedema 02/26/17; the patient has been to see his primary doctor Dr. Jerrel Ivory at [place] our primary care. She started him on Lasix 20 mg and this seems to have helped with the edema. However we are not making substantial change with the left lateral calf wound and inflammation. The biopsy site on the right leg also looks stable but not really all that different. 03/12/17; the patient has been to see vein and vascular Dr. Lucky Cowboy. He has had venous reflux studies I have not reviewed these. I did get a call from his dermatology office. 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 They therefore recommended clobetasol and tetracycline i.e. 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 03/26/17; apparently the patient just had reflux studies not an appointment with Dr. dew. She arrives in clinic today having applied clobetasol for 2-3 weeks. He notes over the last 2-3 days excessive drainage having to change the dressing 3-4 times a day and also expanding erythema. 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 Lillia Carmel at [facility] dermatology locally who kindly saw him at my request. 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 My concern was the degree of erythema medially extending from his left leg wound which was either  inflammation from pyoderma or cellulitis. I put him on Augmentin however culture of the wound showed Pseudomonas which is quinolone sensitive. I really don't believe he has cellulitis however in view of everything I will continue and give him a course of Cipro. He is also on doxycycline as an immune modulator for the pyoderma. In addition to his original wound on the left lateral leg with surrounding erythema he has a wound on the right posterior calf which was an original biopsy site done by dermatology. 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 Saw Dr. Lillia Carmel yesterday. He has been using topical antibiotics to both wound areas his original wound on the left and the biopsies/pathergy area on the right. There is definitely some improvement in the inflammation around the wound on the right although the patient states he has increasing sensitivity of the wounds. He is on prednisone 60 and doxycycline 1 as prescribed by Dr. Lillia Carmel. He is covering the topical  antibiotic with gauze and putting this in his own compression stocks and changing this daily. He states that Dr. Lottie Rater did a culture of the left leg wound yesterday 05/07/17; pyoderma gangrenosum. The patient saw Dr. Lillia Carmel yesterday and has a follow-up with her in one month. He is still using topical antibiotics to both wounds although he can't recall exactly what type. He is still on prednisone 60 mg. Dr. Lillia Carmel stated that the doxycycline could stop if we were in agreement. 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 The right medial leg was induced by biopsy/pathergy. The area on the right is essentially healed. 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 The right medial leg has  closed and remains closed. He is still on prednisone 60. oHe tells me he missed his last dermatology appointment with Dr. Lillia Carmel but will make another appointment. He reports that her blood sugar at a recent screen in [place] was high 200's. He was 180 today. He is more cushingoid blood pressure is Kerman, Adarian ([id number]) up a bit. I think he is going to require still much longer prednisone perhaps another 3 months before attempting to taper. In the meantime his wound is a lot better. Smaller. He is cleaning this off daily and applying topical antibiotics. When he was last in the clinic I thought about changing to [facility] and actually put in a couple of calls to dermatology although probably not during their business hours. 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 He continues on oral prednisone. He has been using triple antibiotic ointment. There is surface debris and we will transition to [facility] and have him return in 2 weeks. He has lost 30 pounds since his last appointment with lifestyle modification. He may benefit from topical steroid cream for treatment this can be considered at a later date. 08/22/17 on evaluation today patient appears to actually be doing rather well in regard to his left lateral lower extremity ulcer. He has actually been managed by Dr. Dellia Nims most recently. Patient is currently on oral steroids at this time. This seems to have been of benefit for him. Nonetheless his last visit was actually with Leah on 08/06/17. Currently he is not utilizing any topical steroid creams although this could be of benefit as well. No fevers, chills, nausea, or vomiting noted at this time. 09/05/17 on evaluation today patient appears to be doing better in regard to his left lateral lower extremity ulcer. He has been tolerating the dressing changes without  complication. He is using Santyl with good effect. Overall I'm very pleased with how things are standing at this point. Patient likewise is happy that this is doing better. 09/19/17 on evaluation today patient actually appears to be doing rather well in regard to his left lateral lower extremity ulcer. 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 He's not having any significant pain. 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 He has been tolerating the Santyl without complication and in general I feel like he's making good  progress. 10/17/17 on evaluation today patient appears to be doing very well in regard to his left lateral lower surety ulcer. He has been tolerating the dressing changes without complication. There does not appear to be any evidence of infection he's alternating the Santyl and the triple antibiotic ointment every other day this seems to be doing well for him. 11/03/17 on evaluation today patient appears to be doing very well in regard to his left lateral lower extremity ulcer. He is been tolerating the dressing changes without complication which is good news. Fortunately there does not appear to be any evidence of infection which is also great news. Overall is doing excellent they are starting to taper down on the prednisone is down to 40 mg at this point it also started topical clobetasol for him. 11/17/17 on evaluation today patient appears to be doing well in regard to his left lateral lower surety ulcer. He's been tolerating the dressing changes without complication. He does note that he is having no pain, no excessive drainage or discharge, and overall he feels like things are going about how he would expect and hope they would. Overall he seems to have no evidence of infection at this time in my  opinion which is good news. 12/04/17-He is seen in follow-up evaluation for right lateral lower extremity ulcer. He has been applying topical steroid cream. Today's measurement show slight increase in size. Over the next 2 weeks we will transition to every other day Santyl and steroid cream. He has been encouraged to monitor for changes and notify clinic with any concerns 12/15/17 on evaluation today patient's left lateral motion the ulcer and fortunately is doing worse again at this point. This just since last week to this week has close to doubled in size according to the patient. I did not seeing last week's I do not have a visual to compare this to in our system was also down so we do not have all the charts and at this point. Nonetheless it does have me somewhat concerned in regard to the fact that again he was worried enough about it he has contact the dermatology that placed them back on the full strength, 50 mg a day of the prednisone that he was taken previous. He continues to alternate using clobetasol along with Santyl at this point. He is obviously somewhat frustrated. 12/22/17 on evaluation today patient appears to be doing a little worse compared to last evaluation. Unfortunately the wound is a little deeper and slightly larger than the last week's evaluation. With that being said he has made some progress in regard to the irritation surrounding at this time unfortunately despite that progress that's been made he still has a significant issue going on here. 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 For that reason I discussed with him today the possibility of perform a wound culture to ensure there's nothing overtly infected. 01/06/18 on evaluation today patient's wound is larger and deeper than previously evaluated. With that being said it  did appear that his wound was infected after my last evaluation with him. Subsequently I did end up prescribing a prescription for Bactrim DS which she has been taking and having no complication with. Fortunately there does not appear to be any evidence of Flannigan, Donley ([id number]) infection at this point in time as far as anything spreading, no want to  touch, and overall I feel like things are showing signs of improvement. 01/13/18 on evaluation today patient appears to be even a little larger and deeper than last time. There still muscle exposed in the base of the wound. Nonetheless he does appear to be less erythematous I do believe inflammation is calming down also believe the infection looks like it's probably resolved at this time based on what I'm seeing. No fevers, chills, nausea, or vomiting noted at this time. 01/30/18 on evaluation today patient actually appears to visually look better for the most part. Unfortunately those visually this looks better he does seem to potentially have what may be an abscess in the muscle that has been noted in the central portion of the wound. This is the first time that I have noted what appears to be fluctuance in the central portion of the muscle. With that being said I'm somewhat more concerned about the fact that this might indicate an abscess formation at this location. I do believe that an ultrasound would be appropriate. This is likely something we need to try to do as soon as possible. He has been switch to mupirocin ointment and he is no longer using the steroid ointment as prescribed by dermatology he sees them again next week he's been decreased from 60 to 40 mg of prednisone. 03/09/18 on evaluation today patient actually appears to be doing a little better compared to last time I saw him. There's not as much erythema surrounding the wound itself. He I did review his most recent infectious disease note which was dated 02/24/18. He saw Dr. Michel Bickers in [place]. 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 Patient is now off of antibiotics and they are continually observing this. There seems to be no change in the past two weeks in my pinion based on what the patient says and what I see today compared to what Dr. Megan Salon likely saw two weeks ago. No fevers, chills, nausea, or vomiting noted at this time. 03/23/18 on evaluation today patient's wound actually appears to be showing signs of improvement which is good news. He is currently still on the Dapsone. He is also working on tapering the prednisone to get off of this and Dr. Lottie Rater is working with him in this regard. Nonetheless overall I feel like the wound is doing well it does appear based on the infectious disease note that I reviewed from Dr. 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 I did also review the results of his ultrasound of left lower extremity which revealed there was a dentist tissue in the base of the wound without an abscess noted. 04/06/18 on evaluation today the patient's left lateral lower extremity ulcer actually appears to be doing fairly well which is excellent news. There does not appear to be any evidence of infection at this time which is also great news. Overall he still does have a significantly large ulceration although little by little he seems to be making progress. He is down to 10 mg a day of the prednisone. 04/20/18 on evaluation today patient actually appears to be doing excellent at this time in regard to his left lower extremity ulcer. He's making signs of good progress unfortunately this is taking much longer than we would really like  to see but nonetheless he is making progress. Fortunately there does not appear to be any evidence of  infection at this time. No fevers, chills, nausea, or vomiting noted at this time. The patient has not been using the Santyl due to the cost he hadn't got in this field yet. He's mainly been using the antibiotic ointment topically. Subsequently he also tells me that he really has not been scrubbing in the shower I think this would be helpful again as I told him it doesn't have to be anything too aggressive to even make it believe just enough to keep it free of some of the loose slough and biofilm on the wound surface. 05/11/18 on evaluation today patient's wound appears to be making slow but sure progress in regard to the left lateral lower extremity ulcer. He is been tolerating the dressing changes without complication. Fortunately there does not appear to be any evidence of infection at this time. He is still just using triple antibiotic ointment along with clobetasol occasionally over the area. He never got the Santyl and really does not seem to intend to in my pinion. 06/01/18 on evaluation today patient appears to be doing a little better in regard to his left lateral lower extremity ulcer. He states that overall he does not feel like he is doing as well with the Dapsone as he did with the prednisone. Nonetheless he sees his dermatologist later today and is gonna talk to them about the possibility of going back on the prednisone. Overall again I believe that the wound would be better if you would utilize Santyl but he really does not seem to be interested in going back to the [place] at this point. He has been using triple antibiotic ointment. 06/15/18 on evaluation today patient's wound actually appears to be doing about the same at this point. Fortunately there is no signs of infection at this time. He has made slight improvements although he continues to not really want to clean the wound bed at this point. He states that he just doesn't mess with it he doesn't want to cause any problems with  everything else he has going on. He has been on medication, antibiotics as prescribed by his dermatologist, for a staff infection of his lower extremities which is really drying out now and looking much better he tells me. Fortunately there is no sign of overall infection. BELFORD, PASCUCCI ([id number]) 06/29/18 on evaluation today patient appears to be doing well in regard to his left lateral lower surety ulcer all things considering. Fortunately his staff infection seems to be greatly improved compared to previous. He has no signs of infection and this is drying up quite nicely. He is still the doxycycline for this is no longer on cental, Dapsone, or any of the other medications. His dermatologist has recommended possibility of an infusion but right now he does not want to proceed with that. 07/13/18 on evaluation today patient appears to be doing about the same in regard to his left lateral lower surety ulcer. Fortunately there's no signs of infection at this time which is great news. Unfortunately he still builds up a significant amount of Slough/biofilm of the surface of the wound he still is not really cleaning this as he should be appropriately. Again I'm able to easily with saline and gauze remove the majority of this on the surface which if you would do this at home would likely be a dramatic improvement for him as far as getting the area  to improve. Nonetheless overall I still feel like he is making progress is just very slow. I think Santyl will be of benefit for him as well. Still he has not gotten this as of this point. 07/27/18 on evaluation today patient actually appears to be doing little worse in regards of the erythema around the periwound region of the wound he also tells me that he's been having more drainage currently compared to what he was experiencing last time I saw him. He states not quite as bad as what he had because this was infected previously but nonetheless is still appears  to be doing poorly. Fortunately there is no evidence of systemic infection at this point. The patient tells me that he is not going to be able to afford the Santyl. He is still waiting to hear about the infusion therapy with his dermatologist. Apparently she wants an updated colonoscopy first. 08/10/18 on evaluation today patient appears to be doing better in regard to his left lateral lower extremity ulcer. Fortunately he is showing signs of improvement in this regard he's actually been approved for Remicade infusion's as well although this has not been scheduled as of yet. Fortunately there's no signs of active infection at this time in regard to the wound although he is having some issues with infection of the right lower extremity is been seen as dermatologist for this. Fortunately they are definitely still working with him trying to keep things under control. 09/07/18 on evaluation today patient is actually doing rather well in regard to his left lateral lower extremity ulcer. He notes these actually having some hair grow back on his extremity which is something he has not seen in years. He also tells me that the pain is really not giving them any trouble at this time which is also good news overall she is very pleased with the progress he's using a combination of the mupirocin along with the probate is all mixed. 09/21/18 on evaluation today patient actually appears to be doing fairly well all things considered in regard to his looks from the ulcer. He's been tolerating the dressing changes without complication. Fortunately there's no signs of active infection at this time which is good news he is still on all antibiotics or prevention of the staff infection. He has been on prednisone for time although he states it is gonna contact his dermatologist and see if she put them on a short course due to some irritation that he has going on currently. Fortunately there's no evidence of any overall  worsening this is going very slow I think cental would be something that would be helpful for him although he states that $50 for tube is quite expensive. He therefore is not willing to get that at this point. 10/06/18 on evaluation today patient actually appears to be doing decently well in regard to his left lateral leg ulcer. He's been tolerating the dressing changes without complication. Fortunately there's no signs of active infection at this time. Overall I'm actually rather pleased with the progress he's making although it's slow he doesn't show any signs of infection and he does seem to be making some improvement. I do believe that he may need a switch up and dressings to try to help this to heal more appropriately and quickly. 10/19/18 on evaluation today patient actually appears to be doing better in regard to his left lateral lower extremity ulcer. This is shown signs of having much less Slough buildup at this point due to  the fact he has been using the Santyl. Obviously this is very good news. The overall size of the wound is not dramatically smaller but again the appearance is. 11/02/18 on evaluation today patient actually appears to be doing quite well in regard to his lower Trinity ulcer. A lot of the skin around the ulcer is actually somewhat irritating at this point this seems to be more due to the dressing causing irritation from the adhesive that anything else. Fortunately there is no signs of active infection at this time. 11/24/18 on evaluation today patient appears to be doing a little worse in regard to his overall appearance of his lower extremity ulcer. There's more erythema and warmth around the wound unfortunately. He is currently on doxycycline which he has been on for some time. With that being said I'm not sure that seems to be helping with what appears to possibly be an acute cellulitis with regard to his left lower extremity ulcer. No fevers, chills, nausea, or vomiting  noted at this time. 12/08/18 on evaluation today patient's wounds actually appears to be doing significantly better compared to his last evaluation. He has been using Santyl along with alternating tripling about appointment as well as the steroid cream seems to be doing Skufca, Ayansh ([id number]) quite well and the wound is showing signs of improvement which is excellent news. Fortunately there's no evidence of infection and in fact his culture came back negative with only normal skin flora noted. 12/21/2018 upon evaluation today patient actually appears to be doing excellent with regard to his ulcer. This is actually the best that I have seen it since have been helping to take care of him. It is both smaller as well as less slough noted on the surface of the wound and seems to be showing signs of good improvement with new skin growing from the edges. He has been using just the triamcinolone he does wonder if he can get a refill of that ointment today. 01/04/2019 upon evaluation today patient actually appears to be doing well with regard to his left lateral lower extremity ulcer. With that being said it does not appear to be that he is doing quite as well as last time as far as progression is concerned. There does not appear to be any signs of infection or significant irritation which is good news. With that being said I do believe that he may benefit from switching to a collagen based dressing based on how clean The wound appears. 01/18/2019 on evaluation today patient actually appears to be doing well with regard to his wound on the left lower extremity. He is not made a lot of progress compared to where we were previous but nonetheless does seem to be doing okay at this time which is good news. There is no signs of active infection which is also good news. My only concern currently is I do wish we can get him into utilizing the collagen dressing his insurance would not pay for the supplies that we  ordered although it appears that he may be able to order this through his supply company that he typically utilizes. This is [employer]. Nonetheless he did try to order it during the office visit today and it appears this did go through. We will see if he can get that it is a different brand but nonetheless he has collagen and I do think will be beneficial. 02/01/2019 on evaluation today patient actually appears to be doing a little worse today in regard  to the overall size of his wounds. Fortunately there is no signs of active infection at this time. That is visually. Nonetheless when this is happened before it was due to infection. For that reason were somewhat concerned about that this time as well. 02/08/2019 on evaluation today patient unfortunately appears to be doing slightly worse with regard to his wound upon evaluation today. Is measuring a little deeper and a little larger unfortunately. I am not really sure exactly what is causing this to enlarge he actually did see his dermatologist she is going to see about initiating Humira for him. Subsequently she also did do steroid injections into the wound itself in the periphery. Nonetheless still nonetheless he seems to be getting a little bit larger he is gone back to just using the steroid cream topically which I think is appropriate. I would say hold off on the collagen for the time being is definitely a good thing to do. Based on the culture results which we finally did get the final result back regarding it shows staph as the bacteria noted again that can be a normal skin bacteria based on the fact however he is having increased drainage and worsening of the wound measurement wise I would go ahead and place him on an antibiotic today I do believe for this. 02/15/2019 on evaluation today patient actually appears to be doing somewhat better in regard to his ulcer. There is no signs of worsening at this time I did review his culture results which  showed evidence of Staphylococcus aureus but not MRSA. Again this could just be more related to the normal skin bacteria although he states the drainage has slowed down quite a bit he may have had a mild infection not just colonization. And was much smaller and then since around10/04/2019 on evaluation today patient appears to be doing unfortunately worse as far as the size of the wound. I really feel like that this is steadily getting larger again it had been doing excellent right at the beginning of September we have seen a steady increase in the area of the wound it is almost 2-1/2 times the size it was on September 1. Obviously this is a bad trend this is not wanting to see. For that reason we went back to using just the topical triamcinolone cream which does seem to help with inflammation. I checked him for bacteria by way of culture and nothing showed positive there. I am considering giving him a short course of a tapering steroid Dosepak today to see if that is can be beneficial for him. The patient is in agreement with giving that a try. Electronic Signature(s) Signed: 03/01/2019 8:51:42 AM By: Worthy Keeler PA-C Entered By: Worthy Keeler on 03/01/2019 08:51:42 AKIA, [name] ([id number]) -------------------------------------------------------------------------------- Physical Exam Details Patient Name: [name] Date of Service: 03/01/2019 8:30 AM Medical Record Number: [id number] Patient Account Number: [id number] Date of Birth/Sex: [birth year] (38 y.o. M) Treating RN: Harold Barban Primary Care Provider: [name] Other Clinician: Referring Provider: [name] Treating Provider/Extender: Melburn Hake, Alonah Lineback Weeks in Treatment: 75 Constitutional Well-nourished and well-hydrated in no acute distress. Respiratory normal breathing without difficulty. clear to auscultation bilaterally. Cardiovascular regular rate and rhythm with normal S1, S2. Psychiatric this  patient is able to make decisions and demonstrates good insight into disease process. Alert and Oriented x 3. pleasant and cooperative. Notes Upon inspection today patient's wound bed really does not appear to be significantly worse from the standpoint overall of appearance of  the biggest issue is that it is larger. That is also deeper. This is a trend that we have been seeing over the past several visits unfortunately and again he has been doing very well and in fact I was hopeful was going to get this to close before it started to backtrack yet again. Nonetheless we have been using really the same treatment with the antibiotic ointment since things were doing poorly that has helped control the inflammation but is not helping this to arrest and enlargement at all. Electronic Signature(s) Signed: 03/01/2019 8:52:24 AM By: Worthy Keeler PA-C Entered By: Worthy Keeler on 03/01/2019 08:52:24 [name] ([id number]) -------------------------------------------------------------------------------- Physician Orders Details Patient Name: [name] Date of Service: 03/01/2019 8:30 AM Medical Record Number: [id number] Patient Account Number: [id number] Date of Birth/Sex: Sep 26, 1980 (38 y.o. 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 Anesthetic (add to Medication List) Wound #1 Left,Lateral Lower Leg o Topical Lidocaine 4% cream applied to wound bed prior to debridement (In Clinic Only). Primary Wound Dressing Wound #1 Left,Lateral  Lower Leg o Other: - TCA to wound bed only Secondary Dressing Wound #1 Left,Lateral Lower Leg o Boardered Foam Dressing Dressing Change Frequency Wound #1 Left,Lateral Lower Leg o Change dressing every other day. Follow-up Appointments Wound #1 Left,Lateral Lower Leg o Return Appointment in 1 week. Edema Control Wound #1 Left,Lateral Lower Leg o Elevate legs to the level of the heart and pump ankles as often as possible Patient Medications Allergies: milk, Biaxin, seasonal Notifications Medication Indication Start End prednisone 03/01/2019 NIKIA, MANGINO ([id number]) Notifications Medication Indication Start End DOSE 48 - oral 10 mg tablets,dose pack - 48 tablets,dose pack oral tapering over 12 days as directed on package/insert. do not take ibuprofen while taking this medication Notes 12 day tapering pack Steroid PO Electronic Signature(s) Signed: 03/01/2019 8:56:16 AM By: Worthy Keeler PA-C Entered By: Worthy Keeler on 03/01/2019 08:56:16 [name], Judson ([id number]) -------------------------------------------------------------------------------- Problem List Details Patient Name: [name] Date of Service: 03/01/2019 8:30 AM Medical Record Number: [id number] Patient Account Number: [id number] Date of Birth/Sex: 12-20-1980 (38 y.o. M) Treating RN: Harold Barban Primary Care Provider: [name] Other Clinician: Referring Provider: [name] Treating Provider/Extender: Melburn Hake, Valdez Brannan Weeks in Treatment: 116 Active Problems ICD-10 Evaluated Encounter Code Description Active Date Today Diagnosis L97.222 Non-pressure chronic ulcer of left calf with fat layer exposed 12/04/2016 No Yes L88 Pyoderma gangrenosum 03/26/2017 No Yes I87.2 Venous insufficiency (chronic) (peripheral) 12/04/2016 No Yes Inactive Problems ICD-10 Code Description Active Date Inactive Date L97.213 Non-pressure chronic ulcer of right calf with necrosis of muscle 04/02/2017  04/02/2017 Resolved Problems Electronic Signature(s) Signed: 03/01/2019 8:42:49 AM By: Worthy Keeler PA-C Entered By: Worthy Keeler on 03/01/2019 08:42:48 [name] ([id number]) -------------------------------------------------------------------------------- Progress Note Details Patient Name: [name] Date of Service: 03/01/2019 8:30 AM Medical Record Number: [id number] Patient Account Number: [id number] Date of Birth/Sex: August 08, 1980 (38 y.o. M) Treating RN: Harold Barban Primary Care Provider: [name] Other Clinician: Referring Provider: [name] Treating Provider/Extender: Melburn Hake, Carel Carrier Weeks in Treatment: 116 Subjective Chief Complaint Information obtained from Patient He is here in follow up evaluation for LLE pyoderma ulcer  History of Present Illness (HPI) 12/04/16; 38 year old man who comes into the clinic today for review of a wound on the posterior left calf. He tells me that is been there for about a year. He is not a diabetic he does smoke half a pack per day. He was seen in the ER on 11/20/16 felt to have cellulitis around the wound and was given clindamycin. An x-ray did not show osteomyelitis. The patient initially tells me that he has a milk allergy that sets off a pruritic itching rash on his lower legs which she scratches incessantly and he thinks that's what may have set up the wound. He has been using various topical antibiotics and ointments without any effect. He works in a [employer]ing [employer] and is on his feet all day. He does not have a prior history of wounds however he does have the rash on both lower legs the right arm and the ventral aspect of his left arm. These are excoriations and clearly have had scratching however there are of macular looking areas on both legs including a substantial larger area on the right leg. This does not have an underlying open area. There is no blistering. The patient tells me that 2 years ago in [place] in  response to the rash on his legs he saw a dermatologist who told him he had a condition which may be pyoderma gangrenosum although I may be putting words into his mouth. He seemed to recognize this. 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 He is not in any treatment for this. He's had no recent travel 12/11/16; the patient arrives today with his wound and roughly the same condition we've been using silver alginate this is a deep punched out wound with some surrounding erythema but no tenderness. Biopsy I did did not show confirmed pyoderma gangrenosum suggested nonspecific inflammation and vasculitis but does not provide an actual description of what was seen by the pathologist. I'm really not able to understand this We have also received information from the patient's dermatologist in [place] notes from April 2016. This was a doctor Agarwal- antal. 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 He was prescribed topical steroid high potency under occlusion which helped but at this point the patient did not have a deep punched out wound. 12/18/16; the patient's wound is larger in terms of surface area however this surface looks better and there is less depth. The surrounding erythema also is better. The patient states that the wrap we put on came off 2 days ago when he has been using his compression stockings. He we are in the process of getting a dermatology consult. 12/26/16 on evaluation today patient's left lower extremity wound shows evidence of infection with surrounding erythema noted. He has been tolerating the dressing changes but states that he has noted more discomfort. There is a larger area of erythema surrounding the wound. No fevers, chills, nausea, or vomiting noted at this time. With that being said the wound still does have slough covering the surface. He is not allergic to any medication that he is aware of at this point. In regard  to his right lower extremity he had several regions that are erythematous and pruritic he wonders if there's anything we can do to help that. 01/02/17 I reviewed patient's wound culture which was obtained his visit last week. He was placed on doxycycline at that point. Unfortunately that does not appear to be an antibiotic  that would likely help with the situation however the pseudomonas noted on culture is sensitive to Cipro. Also unfortunately patient's wound seems to have a large compared to last week's evaluation. Not severely so but there are definitely increased measurements in general. He is continuing to have discomfort as well he writes this to be a seven out of 10. In fact he would prefer me not to perform any debridement today due to the fact that he is having discomfort and considering he has an active infection on the little reluctant to do so anyway. No fevers, Canipe, Garyn ([id number]) chills, nausea, or vomiting noted at this time. 01/08/17; patient seems dermatology on September 5. I suspect dermatology will want the slides from the biopsy I did sent to their pathologist. I'm not sure if there is a way we can expedite that. In any case the culture I did before I left on vacation 3 weeks ago showed Pseudomonas he was given 10 days of Cipro and per her description of her intake nurses is actually somewhat better this week although the wound is quite a bit bigger than I remember the last time I saw this. He still has 3 more days of Cipro 01/21/17; dermatology appointment tomorrow. He has completed the ciprofloxacin for Pseudomonas. Surface of the wound looks better however he is had some deterioration in the lesions on his right leg. Meantime the left lateral leg wound we will continue with sample 01/29/17; patient had his dermatology appointment but I can't yet see that note. He is completed his antibiotics. The wound is more superficial but considerably larger in circumferential area  than when he came in. This is in his left lateral calf. He also has swollen erythematous areas with superficial wounds on the right leg and small papular areas on both arms. There apparently areas in her his upper thighs and buttocks I did not look at those. Dermatology biopsied the right leg. Hopefully will have their input next week. 02/05/17; patient went back to see his dermatologist who told him that he had a "scratching problem" as well as staph. He is now on a 30 day course of doxycycline and I believe she gave him triamcinolone cream to the right leg areas to help with the itching [not exactly sure but probably triamcinolone]. She apparently looked at the left lateral leg wound although this was not rebiopsied and I think felt to be ultimately part of the same pathogenesis. He is using sample border foam and changing nevus himself. He now has a new open area on the right posterior leg which was his biopsy site I don't have any of the dermatology notes 02/12/17; we put the patient in compression last week with SANTYL to the wound on the left leg and the biopsy. Edema is much better and the depth of the wound is now at level of skin. Area is still the same Biopsy site on the right lateral leg we've also been using santyl with a border foam dressing and he is changing this himself. 02/19/17; Using silver alginate started last week to both the substantial left leg wound and the biopsy site on the right wound. He is tolerating compression well. Has a an appointment with his primary M.D. tomorrow wondering about diuretics although I'm wondering if the edema problem is actually lymphedema 02/26/17; the patient has been to see his primary doctor Dr. Jerrel Ivory at [place] our primary care. She started him on Lasix 20 mg and this seems to have helped with  the edema. However we are not making substantial change with the left lateral calf wound and inflammation. The biopsy site on the right leg also  looks stable but not really all that different. 03/12/17; the patient has been to see vein and vascular Dr. Lucky Cowboy. He has had venous reflux studies I have not reviewed these. I did get a call from his dermatology office. 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 They therefore recommended clobetasol and tetracycline i.e. 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 03/26/17; apparently the patient just had reflux studies not an appointment with Dr. dew. She arrives in clinic today having applied clobetasol for 2-3 weeks. He notes over the last 2-3 days excessive drainage having to change the dressing 3-4 times a day and also expanding erythema. 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 Lillia Carmel at [facility] dermatology locally who kindly saw him at my request. 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 My concern was the degree of erythema medially extending from his left leg wound which was either inflammation from pyoderma or cellulitis. I put him on Augmentin however culture of the wound showed Pseudomonas which is quinolone sensitive. I really don't believe he has cellulitis however in view of everything I will continue and give him a course of Cipro. He is also on doxycycline as an immune modulator for the pyoderma. In addition to his original wound on the left lateral leg with surrounding erythema he has a  wound on the right posterior calf which was an original biopsy site done by dermatology. 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 Saw Dr. Lillia Carmel yesterday. He has been using topical antibiotics to both wound areas his original wound on the left and the biopsies/pathergy area on the right. There is definitely some improvement in the inflammation around the wound on the right although the patient states he has increasing sensitivity of the wounds. He is on prednisone 60 and doxycycline 1 as prescribed by Dr. Lillia Carmel. He is covering the topical antibiotic with gauze and putting this in his own compression stocks and changing this daily. He states that Dr. Lottie Rater did a culture of the left leg wound yesterday 05/07/17; pyoderma gangrenosum. The patient saw Dr. Lillia Carmel yesterday and has a follow-up with her in one month. He is still using topical antibiotics to both wounds although he can't recall exactly what type. He is still on prednisone 60 mg. Dr. Lillia Carmel stated that the doxycycline could stop if we were in agreement. 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 The right medial leg was induced by [name] ([id number]) biopsy/pathergy. The area on the right is essentially healed. 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 The right medial leg has closed  and remains closed. He is still on prednisone 60. He tells me he missed his last dermatology appointment with Dr. Lillia Carmel but will make another appointment. He reports that her blood sugar at a recent screen in [place] was high 200's. He was 180 today. 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 I think he is going to require still much longer prednisone perhaps another 3  months before attempting to taper. In the meantime his wound is a lot better. Smaller. He is cleaning this off daily and applying topical antibiotics. When he was last in the clinic I thought about changing to [facility] and actually put in a couple of calls to dermatology although probably not during their business hours. 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 He continues on oral prednisone. He has been using triple antibiotic ointment. There is surface debris and we will transition to [facility] and have him return in 2 weeks. He has lost 30 pounds since his last appointment with lifestyle modification. He may benefit from topical steroid cream for treatment this can be considered at a later date. 08/22/17 on evaluation today patient appears to actually be doing rather well in regard to his left lateral lower extremity ulcer. He has actually been managed by Dr. Dellia Nims most recently. Patient is currently on oral steroids at this time. This seems to have been of benefit for him. Nonetheless his last visit was actually with Leah on 08/06/17. Currently he is not utilizing any topical steroid creams although this could be of benefit as well. No fevers, chills, nausea, or vomiting noted at this time. 09/05/17 on evaluation today patient appears to be doing better in regard to his left lateral lower extremity ulcer. He has been tolerating the dressing changes without complication. He is using Santyl with good effect. Overall I'm very pleased with how things are standing at this point. Patient likewise is happy that this is doing better. 09/19/17 on evaluation today patient actually appears to be doing rather well in regard to his left lateral lower extremity ulcer. Again this is secondary to Pyoderma gangrenosum and he seems to be progressing well with the Santyl which is good  news. He's not having any significant pain. 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 He has been tolerating the Santyl without complication and in general I feel like he's making good progress. 10/17/17 on evaluation today patient appears to be doing very well in regard to his left lateral lower surety ulcer. He has been tolerating the dressing changes without complication. There does not appear to be any evidence of infection he's alternating the Santyl and the triple antibiotic ointment every other day this seems to be doing well for him. 11/03/17 on evaluation today patient appears to be doing very well in regard to his left lateral lower extremity ulcer. He is been tolerating the dressing changes without complication which is good news. Fortunately there does not appear to be any evidence of infection which is also great news. Overall is doing excellent they are starting to taper down on the prednisone is down to 40 mg at this point it also started topical clobetasol for him. 11/17/17 on evaluation today patient appears to be doing well in regard to his left  lateral lower surety ulcer. He's been tolerating the dressing changes without complication. He does note that he is having no pain, no excessive drainage or discharge, and overall he feels like things are going about how he would expect and hope they would. Overall he seems to have no evidence of infection at this time in my opinion which is good news. 12/04/17-He is seen in follow-up evaluation for right lateral lower extremity ulcer. He has been applying topical steroid cream. Today's measurement show slight increase in size. Over the next 2 weeks we will transition to every other day Santyl and steroid cream. He has been encouraged to monitor for changes and notify clinic with any concerns 12/15/17 on evaluation today patient's left  lateral motion the ulcer and fortunately is doing worse again at this point. This just since last week to this week has close to doubled in size according to the patient. I did not seeing last week's I do not have a visual to compare this to in our system was also down so we do not have all the charts and at this point. Nonetheless it does have me somewhat concerned in regard to the fact that again he was worried enough about it he has contact the dermatology that placed them back on the full strength, 50 mg a day of the prednisone that he was taken previous. He continues to alternate using clobetasol along with Santyl at this point. He is obviously somewhat frustrated. 12/22/17 on evaluation today patient appears to be doing a little worse compared to last evaluation. Unfortunately the wound is a little deeper and slightly larger than the last week's evaluation. With that being said he has made some progress in regard to the irritation surrounding at this time unfortunately despite that progress that's been made he still has a significant issue going on here. I'm not certain that he is having really any true infection at this time although with the Pyoderma gangrenosum it can Wigington, Cleatis ([id number]) sometimes be difficult to differentiate infection versus just inflammation. For that reason I discussed with him today the possibility of perform a wound culture to ensure there's nothing overtly infected. 01/06/18 on evaluation today patient's wound is larger and deeper than previously evaluated. With that being said it did appear that his wound was infected after my last evaluation with him. Subsequently I did end up prescribing a prescription for Bactrim DS which she has been taking and having no complication with. Fortunately there does not appear to be any evidence of infection at this point in time as far as anything spreading, no want to touch, and overall I feel like things are showing signs  of improvement. 01/13/18 on evaluation today patient appears to be even a little larger and deeper than last time. There still muscle exposed in the base of the wound. Nonetheless he does appear to be less erythematous I do believe inflammation is calming down also believe the infection looks like it's probably resolved at this time based on what I'm seeing. No fevers, chills, nausea, or vomiting noted at this time. 01/30/18 on evaluation today patient actually appears to visually look better for the most part. Unfortunately those visually this looks better he does seem to potentially have what may be an abscess in the muscle that has been noted in the central portion of the wound. This is the first time that I have noted what appears to be fluctuance in the central portion of the muscle. With that being said  I'm somewhat more concerned about the fact that this might indicate an abscess formation at this location. I do believe that an ultrasound would be appropriate. This is likely something we need to try to do as soon as possible. He has been switch to mupirocin ointment and he is no longer using the steroid ointment as prescribed by dermatology he sees them again next week he's been decreased from 60 to 40 mg of prednisone. 03/09/18 on evaluation today patient actually appears to be doing a little better compared to last time I saw him. There's not as much erythema surrounding the wound itself. He I did review his most recent infectious disease note which was dated 02/24/18. He saw Dr. Michel Bickers in [place]. 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 Patient is now off of antibiotics and they are continually observing this. There seems to be no change in the past two weeks in my pinion based on what the patient says and what I see today compared to what Dr. Megan Salon likely saw two weeks ago. No fevers,  chills, nausea, or vomiting noted at this time. 03/23/18 on evaluation today patient's wound actually appears to be showing signs of improvement which is good news. He is currently still on the Dapsone. He is also working on tapering the prednisone to get off of this and Dr. Lottie Rater is working with him in this regard. Nonetheless overall I feel like the wound is doing well it does appear based on the infectious disease note that I reviewed from Dr. 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 I did also review the results of his ultrasound of left lower extremity which revealed there was a dentist tissue in the base of the wound without an abscess noted. 04/06/18 on evaluation today the patient's left lateral lower extremity ulcer actually appears to be doing fairly well which is excellent news. There does not appear to be any evidence of infection at this time which is also great news. Overall he still does have a significantly large ulceration although little by little he seems to be making progress. He is down to 10 mg a day of the prednisone. 04/20/18 on evaluation today patient actually appears to be doing excellent at this time in regard to his left lower extremity ulcer. He's making signs of good progress unfortunately this is taking much longer than we would really like to see but nonetheless he is making progress. Fortunately there does not appear to be any evidence of infection at this time. No fevers, chills, nausea, or vomiting noted at this time. The patient has not been using the Santyl due to the cost he hadn't got in this field yet. He's mainly been using the antibiotic ointment topically. Subsequently he also tells me that he really has not been scrubbing in the shower I think this would be helpful again as I told him it doesn't have to be anything too aggressive to  even make it believe just enough to keep it free of some of the loose slough and biofilm on the wound surface. 05/11/18 on evaluation today patient's wound appears to be making slow but sure progress in regard to the left lateral lower extremity ulcer. He is been tolerating the dressing changes without complication. Fortunately there does  not appear to be any evidence of infection at this time. He is still just using triple antibiotic ointment along with clobetasol occasionally over the area. He never got the Santyl and really does not seem to intend to in my pinion. 06/01/18 on evaluation today patient appears to be doing a little better in regard to his left lateral lower extremity ulcer. He states that overall he does not feel like he is doing as well with the Dapsone as he did with the prednisone. Nonetheless he sees his dermatologist later today and is gonna talk to them about the possibility of going back on the prednisone. Overall again I believe that the wound would be better if you would utilize Santyl but he really does not seem to be interested in going back to the [place] at this point. He has been using triple antibiotic ointment. JAKAYDEN, CANCIO ([id number]) 06/15/18 on evaluation today patient's wound actually appears to be doing about the same at this point. Fortunately there is no signs of infection at this time. He has made slight improvements although he continues to not really want to clean the wound bed at this point. He states that he just doesn't mess with it he doesn't want to cause any problems with everything else he has going on. He has been on medication, antibiotics as prescribed by his dermatologist, for a staff infection of his lower extremities which is really drying out now and looking much better he tells me. Fortunately there is no sign of overall infection. 06/29/18 on evaluation today patient appears to be doing well in regard to his left lateral lower surety ulcer all  things considering. Fortunately his staff infection seems to be greatly improved compared to previous. He has no signs of infection and this is drying up quite nicely. He is still the doxycycline for this is no longer on cental, Dapsone, or any of the other medications. His dermatologist has recommended possibility of an infusion but right now he does not want to proceed with that. 07/13/18 on evaluation today patient appears to be doing about the same in regard to his left lateral lower surety ulcer. Fortunately there's no signs of infection at this time which is great news. Unfortunately he still builds up a significant amount of Slough/biofilm of the surface of the wound he still is not really cleaning this as he should be appropriately. Again I'm able to easily with saline and gauze remove the majority of this on the surface which if you would do this at home would likely be a dramatic improvement for him as far as getting the area to improve. Nonetheless overall I still feel like he is making progress is just very slow. I think Santyl will be of benefit for him as well. Still he has not gotten this as of this point. 07/27/18 on evaluation today patient actually appears to be doing little worse in regards of the erythema around the periwound region of the wound he also tells me that he's been having more drainage currently compared to what he was experiencing last time I saw him. He states not quite as bad as what he had because this was infected previously but nonetheless is still appears to be doing poorly. Fortunately there is no evidence of systemic infection at this point. The patient tells me that he is not going to be able to afford the Santyl. He is still waiting to hear about the infusion therapy with his dermatologist. Apparently she wants an updated  colonoscopy first. 08/10/18 on evaluation today patient appears to be doing better in regard to his left lateral lower extremity ulcer.  Fortunately he is showing signs of improvement in this regard he's actually been approved for Remicade infusion's as well although this has not been scheduled as of yet. Fortunately there's no signs of active infection at this time in regard to the wound although he is having some issues with infection of the right lower extremity is been seen as dermatologist for this. Fortunately they are definitely still working with him trying to keep things under control. 09/07/18 on evaluation today patient is actually doing rather well in regard to his left lateral lower extremity ulcer. He notes these actually having some hair grow back on his extremity which is something he has not seen in years. He also tells me that the pain is really not giving them any trouble at this time which is also good news overall she is very pleased with the progress he's using a combination of the mupirocin along with the probate is all mixed. 09/21/18 on evaluation today patient actually appears to be doing fairly well all things considered in regard to his looks from the ulcer. He's been tolerating the dressing changes without complication. Fortunately there's no signs of active infection at this time which is good news he is still on all antibiotics or prevention of the staff infection. He has been on prednisone for time although he states it is gonna contact his dermatologist and see if she put them on a short course due to some irritation that he has going on currently. Fortunately there's no evidence of any overall worsening this is going very slow I think cental would be something that would be helpful for him although he states that $50 for tube is quite expensive. He therefore is not willing to get that at this point. 10/06/18 on evaluation today patient actually appears to be doing decently well in regard to his left lateral leg ulcer. He's been tolerating the dressing changes without complication. Fortunately there's no  signs of active infection at this time. Overall I'm actually rather pleased with the progress he's making although it's slow he doesn't show any signs of infection and he does seem to be making some improvement. I do believe that he may need a switch up and dressings to try to help this to heal more appropriately and quickly. 10/19/18 on evaluation today patient actually appears to be doing better in regard to his left lateral lower extremity ulcer. This is shown signs of having much less Slough buildup at this point due to the fact he has been using the [employer]. Obviously this is very good news. The overall size of the wound is not dramatically smaller but again the appearance is. 11/02/18 on evaluation today patient actually appears to be doing quite well in regard to his lower [place] ulcer. A lot of the skin around the ulcer is actually somewhat irritating at this point this seems to be more due to the dressing causing irritation from the adhesive that anything else. Fortunately there is no signs of active infection at this time. 11/24/18 on evaluation today patient appears to be doing a little worse in regard to his overall appearance of his lower extremity Dimarco, Woodie ([id number]) ulcer. There's more erythema and warmth around the wound unfortunately. He is currently on doxycycline which he has been on for some time. With that being said I'm not sure that seems to be helping with  what appears to possibly be an acute cellulitis with regard to his left lower extremity ulcer. No fevers, chills, nausea, or vomiting noted at this time. 12/08/18 on evaluation today patient's wounds actually appears to be doing significantly better compared to his last evaluation. He has been using Santyl along with alternating tripling about appointment as well as the steroid cream seems to be doing quite well and the wound is showing signs of improvement which is excellent news. Fortunately there's no evidence  of infection and in fact his culture came back negative with only normal skin flora noted. 12/21/2018 upon evaluation today patient actually appears to be doing excellent with regard to his ulcer. This is actually the best that I have seen it since have been helping to take care of him. It is both smaller as well as less slough noted on the surface of the wound and seems to be showing signs of good improvement with new skin growing from the edges. He has been using just the triamcinolone he does wonder if he can get a refill of that ointment today. 01/04/2019 upon evaluation today patient actually appears to be doing well with regard to his left lateral lower extremity ulcer. With that being said it does not appear to be that he is doing quite as well as last time as far as progression is concerned. There does not appear to be any signs of infection or significant irritation which is good news. With that being said I do believe that he may benefit from switching to a collagen based dressing based on how clean The wound appears. 01/18/2019 on evaluation today patient actually appears to be doing well with regard to his wound on the left lower extremity. He is not made a lot of progress compared to where we were previous but nonetheless does seem to be doing okay at this time which is good news. There is no signs of active infection which is also good news. My only concern currently is I do wish we can get him into utilizing the collagen dressing his insurance would not pay for the supplies that we ordered although it appears that he may be able to order this through his supply company that he typically utilizes. This is [employer]. Nonetheless he did try to order it during the office visit today and it appears this did go through. We will see if he can get that it is a different brand but nonetheless he has collagen and I do think will be beneficial. 02/01/2019 on evaluation today patient actually appears  to be doing a little worse today in regard to the overall size of his wounds. Fortunately there is no signs of active infection at this time. That is visually. Nonetheless when this is happened before it was due to infection. For that reason were somewhat concerned about that this time as well. 02/08/2019 on evaluation today patient unfortunately appears to be doing slightly worse with regard to his wound upon evaluation today. Is measuring a little deeper and a little larger unfortunately. I am not really sure exactly what is causing this to enlarge he actually did see his dermatologist she is going to see about initiating Humira for him. Subsequently she also did do steroid injections into the wound itself in the periphery. Nonetheless still nonetheless he seems to be getting a little bit larger he is gone back to just using the steroid cream topically which I think is appropriate. I would say hold off on the collagen  for the time being is definitely a good thing to do. Based on the culture results which we finally did get the final result back regarding it shows staph as the bacteria noted again that can be a normal skin bacteria based on the fact however he is having increased drainage and worsening of the wound measurement wise I would go ahead and place him on an antibiotic today I do believe for this. 02/15/2019 on evaluation today patient actually appears to be doing somewhat better in regard to his ulcer. There is no signs of worsening at this time I did review his culture results which showed evidence of Staphylococcus aureus but not MRSA. Again this could just be more related to the normal skin bacteria although he states the drainage has slowed down quite a bit he may have had a mild infection not just colonization. And was much smaller and then since around10/04/2019 on evaluation today patient appears to be doing unfortunately worse as far as the size of the wound. I really feel like  that this is steadily getting larger again it had been doing excellent right at the beginning of September we have seen a steady increase in the area of the wound it is almost 2-1/2 times the size it was on September 1. Obviously this is a bad trend this is not wanting to see. For that reason we went back to using just the topical triamcinolone cream which does seem to help with inflammation. I checked him for bacteria by way of culture and nothing showed positive there. I am considering giving him a short course of a tapering steroid Dosepak today to see if that is can be beneficial for him. The patient is in agreement with giving that a try. Patient History Information obtained from Patient. Family History Diabetes - Mother,Maternal Grandparents, Heart Disease - Mother,Maternal Grandparents, Hereditary Spherocytosis - Nucci, Meyer ([id number]) Siblings, Hypertension - Maternal Grandparents, Stroke - Siblings, No family history of Cancer, Kidney Disease, Lung Disease, Seizures, Thyroid Problems, Tuberculosis. Social History Current some day smoker, Marital Status - Married, Alcohol Use - Rarely, Drug Use - No History, Caffeine Use - Moderate. Medical History Eyes Denies history of Cataracts, Glaucoma, Optic Neuritis Ear/Nose/Mouth/Throat Denies history of Chronic sinus problems/congestion, Middle ear problems Hematologic/Lymphatic Denies history of Anemia, Hemophilia, Human Immunodeficiency Virus, Lymphedema, Sickle Cell Disease Respiratory Patient has history of Sleep Apnea - cpap Denies history of Aspiration, Asthma, Chronic Obstructive Pulmonary Disease (COPD), Pneumothorax Cardiovascular Patient has history of Hypertension Denies history of Angina, Arrhythmia, Congestive Heart Failure, Coronary Artery Disease, Deep Vein Thrombosis, Hypotension, Myocardial Infarction, Peripheral Arterial Disease, Peripheral Venous Disease, Phlebitis, Vasculitis Gastrointestinal Patient has history  of Colitis Endocrine Denies history of Type I Diabetes, Type II Diabetes Genitourinary Denies history of End Stage Renal Disease Immunological Denies history of Lupus Erythematosus, Raynaud s, Scleroderma Integumentary (Skin) Denies history of History of Burn, History of pressure wounds Musculoskeletal Denies history of Gout, Rheumatoid Arthritis, Osteoarthritis, Osteomyelitis Neurologic Denies history of Dementia, Neuropathy, Quadriplegia, Paraplegia, Seizure Disorder Oncologic Denies history of Received Chemotherapy, Received Radiation Psychiatric Denies history of Anorexia/bulimia, Confinement Anxiety Review of Systems (ROS) Constitutional Symptoms (General Health) Denies complaints or symptoms of Fatigue, Fever, Chills, Marked Weight Change. Respiratory Denies complaints or symptoms of Chronic or frequent coughs, Shortness of Breath. Cardiovascular Complains or has symptoms of LE edema. Denies complaints or symptoms of Chest pain. Psychiatric Denies complaints or symptoms of Anxiety, Claustrophobia. Objective Danielski, Juandiego ([id number]) Constitutional Well-nourished and well-hydrated in no acute distress. Vitals  Time Taken: 8:35 AM, Height: 71 in, Weight: 338 lbs, BMI: 47.1, Temperature: 98.6 F, Pulse: 64 bpm, Respiratory Rate: 16 breaths/min, Blood Pressure: 139/76 mmHg. Respiratory normal breathing without difficulty. clear to auscultation bilaterally. Cardiovascular regular rate and rhythm with normal S1, S2. Psychiatric this patient is able to make decisions and demonstrates good insight into disease process. Alert and Oriented x 3. pleasant and cooperative. General Notes: Upon inspection today patient's wound bed really does not appear to be significantly worse from the standpoint overall of appearance of the biggest issue is that it is larger. That is also deeper. This is a trend that we have been seeing over the past several visits unfortunately and again he has  been doing very well and in fact I was hopeful was going to get this to close before it started to backtrack yet again. Nonetheless we have been using really the same treatment with the antibiotic ointment since things were doing poorly that has helped control the inflammation but is not helping this to arrest and enlargement at all. Integumentary (Hair, Skin) Wound #1 status is Open. Original cause of wound was Gradually Appeared. The wound is located on the Left,Lateral Lower Leg. The wound measures 4cm length x 3.5cm width x 0.4cm depth; 10.996cm^2 area and 4.398cm^3 volume. There is Fat Layer (Subcutaneous Tissue) Exposed exposed. There is no tunneling or undermining noted. There is a medium amount of serous drainage noted. The wound margin is distinct with the outline attached to the wound base. There is small (1-33%) red granulation within the wound bed. There is a large (67-100%) amount of necrotic tissue within the wound bed including Adherent Slough. 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 Anesthetic (add to Medication List): Wound #1 Left,Lateral Lower Leg: Keil, Armany ([id number]) Topical Lidocaine 4% cream applied to wound bed prior to debridement (In Clinic Only). Primary Wound Dressing: Wound #1 Left,Lateral Lower Leg: Other: - TCA to wound bed only Secondary Dressing: Wound #1 Left,Lateral Lower Leg: Boardered Foam Dressing Dressing Change Frequency: Wound #1 Left,Lateral Lower Leg: Change dressing every other day. Follow-up Appointments: Wound #1 Left,Lateral Lower Leg: Return Appointment in 1 week. Edema Control: Wound #1 Left,Lateral Lower Leg: Elevate legs to the level of the heart and pump ankles as often as possible The following medication(s) was  prescribed: prednisone oral 10 mg tablets,dose pack 48 48 tablets,dose pack oral tapering over 12 days as directed on package/insert. do not take ibuprofen while taking this medication starting 03/01/2019 General Notes: 12 day tapering pack Steroid PO 1. At this point I am going to recommend that we go ahead and see about doing an oral steroid Dosepak to see if this can be beneficial for the patient. This will be tapering over 12 days. 2. We will also see about forwarding the note from today to his dermatologist as again I am not sure when they are considering starting the Humira although I think that is something that could be potentially effective for him again that is a little bit out of the realm of what we would normally have an opinion on or prescribe obviously. 3. With regard to the amount of steroid ointment on the wound I did recommend a lesser amount the nurse discussed this with the patient today so that he is not keeping the wound bed too moist which can also be  detrimental. We will see patient back for reevaluation in 1 week here in the clinic. If anything worsens or changes patient will contact our office for additional recommendations. Electronic Signature(s) Signed: 03/01/2019 8:57:06 AM By: Worthy Keeler PA-C Entered By: Worthy Keeler on 03/01/2019 08:57:06 KILO, ESHELMAN ([id number]) -------------------------------------------------------------------------------- ROS/PFSH Details Patient Name: [name] Date of Service: 03/01/2019 8:30 AM Medical Record Number: [id number] Patient Account Number: [id number] Date of Birth/Sex: 1980/11/07 (38 y.o. M) Treating RN: Harold Barban Primary Care Provider: [name] Other Clinician: Referring Provider: [name] Treating Provider/Extender: Melburn Hake, Jonae Renshaw Weeks in Treatment: 116 Information Obtained From Patient Constitutional Symptoms (General Health) Complaints and Symptoms: Negative for: Fatigue; Fever;  Chills; Marked Weight Change Respiratory Complaints and Symptoms: Negative for: Chronic or frequent coughs; Shortness of Breath Medical History: Positive for: Sleep Apnea - cpap Negative for: Aspiration; Asthma; Chronic Obstructive Pulmonary Disease (COPD); Pneumothorax Cardiovascular Complaints and Symptoms: Positive for: LE edema Negative for: Chest pain Medical History: Positive for: Hypertension Negative for: Angina; Arrhythmia; Congestive Heart Failure; Coronary Artery Disease; Deep Vein Thrombosis; Hypotension; Myocardial Infarction; Peripheral Arterial Disease; Peripheral Venous Disease; Phlebitis; Vasculitis Psychiatric Complaints and Symptoms: Negative for: Anxiety; Claustrophobia Medical History: Negative for: Anorexia/bulimia; Confinement Anxiety Eyes Medical History: Negative for: Cataracts; Glaucoma; Optic Neuritis Ear/Nose/Mouth/Throat Medical History: Negative for: Chronic sinus problems/congestion; Middle ear problems Hematologic/Lymphatic Medical History: Negative for: Anemia; Hemophilia; Human Immunodeficiency Virus; Lymphedema; Sickle Cell Disease Bower, Kerri ([id number]) Gastrointestinal Medical History: Positive for: Colitis Endocrine Medical History: Negative for: Type I Diabetes; Type II Diabetes Genitourinary Medical History: Negative for: End Stage Renal Disease Immunological Medical History: Negative for: Lupus Erythematosus; Raynaudos; Scleroderma Integumentary (Skin) Medical History: Negative for: History of Burn; History of pressure wounds Musculoskeletal Medical History: Negative for: Gout; Rheumatoid Arthritis; Osteoarthritis; Osteomyelitis Neurologic Medical History: Negative for: Dementia; Neuropathy; Quadriplegia; Paraplegia; Seizure Disorder Oncologic Medical History: Negative for: Received Chemotherapy; Received Radiation Immunizations Pneumococcal Vaccine: Received Pneumococcal Vaccination: No Implantable Devices No  devices added Family and Social History Cancer: No; Diabetes: Yes - Mother,Maternal Grandparents; Heart Disease: Yes - Mother,Maternal Grandparents; Hereditary Spherocytosis: Yes - Siblings; Hypertension: Yes - Maternal Grandparents; Kidney Disease: No; Lung Disease: No; Seizures: No; Stroke: Yes - Siblings; Thyroid Problems: No; Tuberculosis: No; Current some day smoker; Marital Status - Married; Alcohol Use: Rarely; Drug Use: No History; Caffeine Use: Moderate; Financial Concerns: No; Food, Clothing or Shelter Needs: No; Support System Lacking: No; Transportation Concerns: No Physician Affirmation I have reviewed and agree with the above information. Electronic Signature(s) SALEH, ULBRICH ([id number]) Signed: 03/01/2019 5:28:28 PM By: Worthy Keeler PA-C Signed: 03/03/2019 4:37:37 PM By: Harold Barban Entered By: Worthy Keeler on 03/01/2019 08:52:03 LIMMIE, SCHOENBERG ([id number]) -------------------------------------------------------------------------------- SuperBill Details Patient Name: [name] Date of Service: 03/01/2019 Medical Record Number: [id number] Patient Account Number: [id number] Date of Birth/Sex: [birth year] (38 y.o. M) Treating RN: Harold Barban Primary Care Provider: [name] Other Clinician: Referring Provider: [name] Treating Provider/Extender: Melburn Hake, Jeriel Vivanco Weeks in Treatment: 116 Diagnosis Coding ICD-10 Codes Code Description [phone] Non-pressure chronic ulcer of left calf with fat layer exposed L88 Pyoderma gangrenosum I87.2 Venous insufficiency (chronic) (peripheral) Facility Procedures CPT4 Code: 52778242 Description: 99213 - WOUND CARE VISIT-LEV 3 EST PT Modifier: Quantity: 1 Physician Procedures CPT4 Code: 3536144 Description: 31540 - WC PHYS LEVEL 4 - EST PT ICD-10 Diagnosis Description L97.222 Non-pressure chronic ulcer of left calf with fat layer expo L88 Pyoderma gangrenosum I87.2 Venous insufficiency (chronic)  (peripheral) Modifier: sed Quantity: 1 Electronic Signature(s) Signed: 03/01/2019 8:57:19 AM By: Joaquim Lai  III, Romana Deaton PA-C Entered By: Worthy Keeler on 03/01/2019 08:57:19

## 2019-03-03 NOTE — Progress Notes (Signed)
REEVE, TURNLEY ([id number]) Visit Report for 03/01/2019 Arrival Information Details Patient Name: [name] Date of Service: 03/01/2019 8:30 AM Medical Record Number: [id number] Patient Account Number: [id number] Date of Birth/Sex: 1981/04/20 (38 y.o. M) Treating RN: Montey Hora Primary Care Lean Jaeger: [name] Other Clinician: Referring Andreka Stucki: [name] Treating Robbin Escher/Extender: Melburn Hake, [name]: 78 Visit Information History Since Last Visit Added or deleted any medications: No Patient Arrived: Ambulatory Any new allergies or adverse reactions: No Arrival Time: 08:34 Had a fall or experienced change in No Accompanied By: self activities of daily living that may affect Transfer Assistance: None risk of falls: Patient Identification Verified: Yes Signs or symptoms of abuse/neglect since last visito No Secondary Verification Process Completed: Yes Hospitalized since last visit: No Patient Requires Transmission-Based No Implantable device outside of the clinic excluding No Precautions: cellular tissue based products placed in the center Patient Has Alerts: No since last visit: Has Dressing in Place as Prescribed: Yes Has Compression in Place as Prescribed: Yes Pain Present Now: No Electronic Signature(s) Signed: 03/01/2019 5:14:47 PM By: Montey Hora Entered By: Montey Hora on 03/01/2019 08:35:19 [name] ([id number]) -------------------------------------------------------------------------------- Clinic Level of Care Assessment Details Patient Name: [name] Date of Service: 03/01/2019 8:30 AM Medical Record Number: [id number] Patient Account Number: [id number] Date of Birth/Sex: 09-Feb-1981 (38 y.o. M) Treating RN: Harold Barban Primary Care Tashima Scarpulla: [name] Other Clinician: Referring Ericka Marcellus: [name] Treating Talitha Dicarlo/Extender: Melburn Hake, [name]: 116 Clinic Level of Care  Assessment Items TOOL 4 Quantity Score []  - Use when only an EandM is performed on FOLLOW-UP visit 0 ASSESSMENTS - Nursing Assessment / Reassessment X - Reassessment of Co-morbidities (includes updates in patient status) 1 10 X- 1 5 Reassessment of Adherence to Treatment Plan ASSESSMENTS - Wound and Skin Assessment / Reassessment X - Simple Wound Assessment / Reassessment - one wound 1 5 []  - 0 Complex Wound Assessment / Reassessment - multiple wounds []  - 0 Dermatologic / Skin Assessment (not related to wound area) ASSESSMENTS - Focused Assessment []  - Circumferential Edema Measurements - multi extremities 0 []  - 0 Nutritional Assessment / Counseling / Intervention []  - 0 Lower Extremity Assessment (monofilament, tuning fork, pulses) []  - 0 Peripheral Arterial Disease Assessment (using hand held doppler) ASSESSMENTS - Ostomy and/or Continence Assessment and Care []  - Incontinence Assessment and Management 0 []  - 0 Ostomy Care Assessment and Management (repouching, etc.) PROCESS - Coordination of Care X - Simple Patient / Family Education for ongoing care 1 15 []  - 0 Complex (extensive) Patient / Family Education for ongoing care []  - 0 Staff obtains Programmer, systems, Records, Test Results / Process Orders []  - 0 Staff telephones HHA, Nursing Homes / Clarify orders / etc []  - 0 Routine Transfer to another Facility (non-emergent condition) []  - 0 Routine Hospital Admission (non-emergent condition) []  - 0 New Admissions / Biomedical engineer / Ordering NPWT, Apligraf, etc. []  - 0 Emergency Hospital Admission (emergent condition) X- 1 10 Simple Discharge Coordination Zapanta, Maejor ([id number]) []  - 0 Complex (extensive) Discharge Coordination PROCESS - Special Needs []  - Pediatric / Minor Patient Management 0 []  - 0 Isolation Patient Management []  - 0 Hearing / Language / Visual special needs []  - 0 Assessment of Community assistance (transportation, D/C planning,  etc.) []  - 0 Additional assistance / Altered mentation []  - 0 Support Surface(s) Assessment (bed, cushion, seat, etc.) INTERVENTIONS - Wound Cleansing / Measurement X - Simple Wound Cleansing - one wound 1 5 []  - 0  Complex Wound Cleansing - multiple wounds X- 1 5 Wound Imaging (photographs - any number of wounds) []  - 0 Wound Tracing (instead of photographs) X- 1 5 Simple Wound Measurement - one wound []  - 0 Complex Wound Measurement - multiple wounds INTERVENTIONS - Wound Dressings X - Small Wound Dressing one or multiple wounds 1 10 []  - 0 Medium Wound Dressing one or multiple wounds []  - 0 Large Wound Dressing one or multiple wounds X- 1 5 Application of Medications - topical []  - 0 Application of Medications - injection INTERVENTIONS - Miscellaneous []  - External ear exam 0 []  - 0 Specimen Collection (cultures, biopsies, blood, body fluids, etc.) []  - 0 Specimen(s) / Culture(s) sent or taken to Lab for analysis []  - 0 Patient Transfer (multiple staff / Civil Service fast streamer / Similar devices) []  - 0 Simple Staple / Suture removal (25 or less) []  - 0 Complex Staple / Suture removal (26 or more) []  - 0 Hypo / Hyperglycemic Management (close monitor of Blood Glucose) []  - 0 Ankle / Brachial Index (ABI) - do not check if billed separately X- 1 5 Vital Signs Wallander, [name] ([id number]) Has the patient been seen at the hospital within the last three years: Yes Total Score: 80 Level Of Care: New/Established - Level 3 Electronic Signature(s) Signed: 03/03/2019 4:37:37 PM By: Harold Barban Entered By: Harold Barban on 03/01/2019 08:46:27 [name] ([id number]) -------------------------------------------------------------------------------- Encounter Discharge Information Details Patient Name: [name] Date of Service: 03/01/2019 8:30 AM Medical Record Number: [id number] Patient Account Number: [id number] Date of Birth/Sex: [birth year] (38 y.o. M) Treating RN: Harold Barban Primary Care Jaben Benegas: [name] Other Clinician: Referring Savanna Dooley: [name] Treating Prim Morace/Extender: Melburn Hake, [name]: 116 Encounter Discharge Information Items Discharge Condition: Stable Ambulatory Status: Ambulatory Discharge Destination: Home Transportation: Private Auto Accompanied By: self Schedule Follow-up Appointment: Yes Clinical Summary of Care: Electronic Signature(s) Signed: 03/03/2019 4:37:37 PM By: Harold Barban Entered By: Harold Barban on 03/01/2019 08:50:51 [name] ([id number]) -------------------------------------------------------------------------------- Lower Extremity Assessment Details Patient Name: [name] Date of Service: 03/01/2019 8:30 AM Medical Record Number: [id number] Patient Account Number: [id number] Date of Birth/Sex: 07/19/1980 (38 y.o. M) Treating RN: Montey Hora Primary Care Shayanne Gomm: [name] Other Clinician: Referring Aarik Blank: [name] Treating Ernest Orr/Extender: Melburn Hake, [name]: 116 Edema Assessment Assessed: [Left: No] [Right: No] Edema: [Left: Ye] [Right: s] Vascular Assessment Pulses: Dorsalis Pedis Palpable: [Left:Yes] Electronic Signature(s) Signed: 03/01/2019 5:14:47 PM By: Montey Hora Entered By: Montey Hora on 03/01/2019 08:35:47 Botkins, [name] ([id number]) -------------------------------------------------------------------------------- Multi Wound Chart Details Patient Name: [name] Date of Service: 03/01/2019 8:30 AM Medical Record Number: [id number] Patient Account Number: [id number] Date of Birth/Sex: Oct 11, 1980 (38 y.o. M) Treating RN: Harold Barban Primary Care Shashank Kwasnik: [name] Other Clinician: Referring Lavona Norsworthy: [name] Treating Kerry-Anne Mezo/Extender: Melburn Hake, [name]: 116 Vital Signs Height(in): 71 Pulse(bpm): 64 Weight(lbs): 338 Blood Pressure(mmHg): 139/76 Body  Mass Index(BMI): 47 Temperature(F): 98.6 Respiratory Rate 16 (breaths/min): Photos: [N/A:N/A] Wound Location: Left Lower Leg - Lateral N/A N/A Wounding Event: Gradually Appeared N/A N/A Primary Etiology: Pyoderma N/A N/A Comorbid History: Sleep Apnea, Hypertension, N/A N/A Colitis Date Acquired: 11/18/2015 N/A N/A Weeks of Treatment: 116 N/A N/A Wound Status: Open N/A N/A Measurements L x W x D 4x3.5x0.4 N/A N/A (cm) Area (cm) : 10.996 N/A N/A Volume (cm) : 4.398 N/A N/A % Reduction in Area: -124.00% N/A N/A % Reduction in Volume: -12.00% N/A N/A Classification: Full Thickness With Exposed N/A N/A Support Structures  Exudate Amount: Medium N/A N/A Exudate Type: Serous N/A N/A Exudate Color: amber N/A N/A Wound Margin: Distinct, outline attached N/A N/A Granulation Amount: Small (1-33%) N/A N/A Granulation Quality: Red N/A N/A Necrotic Amount: Large (67-100%) N/A N/A Exposed Structures: Fat Layer (Subcutaneous N/A N/A Tissue) Exposed: Yes Fascia: No Tendon: No Muscle: No Radilla, Obi ([id number]) Joint: No Bone: No Epithelialization: None N/A N/A Treatment Notes Electronic Signature(s) Signed: 03/03/2019 4:37:37 PM By: Harold Barban Entered By: Harold Barban on 03/01/2019 08:44:58 [name] ([id number]) -------------------------------------------------------------------------------- Multi-Disciplinary Care Plan Details Patient Name: [name] Date of Service: 03/01/2019 8:30 AM Medical Record Number: [id number] Patient Account Number: [id number] Date of Birth/Sex: 07/13/1980 (38 y.o. M) Treating RN: Harold Barban Primary Care Khai Torbert: [name] Other Clinician: Referring Danial Hlavac: [name] Treating Rollande Thursby/Extender: Melburn Hake, [name]: 18 Active Inactive Orientation to the Wound Care Program Nursing Diagnoses: Knowledge deficit related to the wound healing center program Goals: Patient/caregiver will verbalize  understanding of the [facility] Program Date Initiated: 12/11/2016 Target Resolution Date: 03/13/2017 Goal Status: Active Interventions: Provide education on orientation to the wound center Notes: Venous Leg Ulcer Nursing Diagnoses: Knowledge deficit related to disease process and management Potential for venous Insuffiency (use before diagnosis confirmed) Goals: Non-invasive venous studies are completed as ordered Date Initiated: 12/11/2016 Target Resolution Date: 03/13/2017 Goal Status: Active Patient will maintain optimal edema control Date Initiated: 12/11/2016 Target Resolution Date: 03/13/2017 Goal Status: Active Patient/caregiver will verbalize understanding of disease process and disease management Date Initiated: 12/11/2016 Target Resolution Date: 03/13/2017 Goal Status: Active Verify adequate tissue perfusion prior to therapeutic compression application Date Initiated: 12/11/2016 Target Resolution Date: 03/13/2017 Goal Status: Active Interventions: Assess peripheral edema status every visit. Compression as ordered Provide education on venous insufficiency Treatment Activities: JEEVAN, KALLA ([id number]) Therapeutic compression applied : 12/11/2016 Notes: Wound/Skin Impairment Nursing Diagnoses: Impaired tissue integrity Knowledge deficit related to ulceration/compromised skin integrity Goals: Patient/caregiver will verbalize understanding of skin care regimen Date Initiated: 12/11/2016 Target Resolution Date: 03/13/2017 Goal Status: Active Ulcer/skin breakdown will have a volume reduction of 30% by week 4 Date Initiated: 12/11/2016 Target Resolution Date: 03/13/2017 Goal Status: Active Ulcer/skin breakdown will have a volume reduction of 50% by week 8 Date Initiated: 12/11/2016 Target Resolution Date: 03/13/2017 Goal Status: Active Ulcer/skin breakdown will have a volume reduction of 80% by week 12 Date Initiated: 12/11/2016 Target Resolution Date:  03/13/2017 Goal Status: Active Ulcer/skin breakdown will heal within 14 weeks Date Initiated: 12/11/2016 Target Resolution Date: 03/13/2017 Goal Status: Active Interventions: Assess patient/caregiver ability to obtain necessary supplies Assess patient/caregiver ability to perform ulcer/skin care regimen upon admission and as needed Assess ulceration(s) every visit Provide education on ulcer and skin care Treatment Activities: Skin care regimen initiated : 12/11/2016 Topical wound management initiated : 12/11/2016 Notes: Electronic Signature(s) Signed: 03/03/2019 4:37:37 PM By: Harold Barban Entered By: Harold Barban on 03/01/2019 08:44:46 [place], [name] ([id number]) -------------------------------------------------------------------------------- Pain Assessment Details Patient Name: [name] Date of Service: 03/01/2019 8:30 AM Medical Record Number: [id number] Patient Account Number: [id number] Date of Birth/Sex: [birth year] (38 y.o. M) Treating RN: Montey Hora Primary Care Ramandeep Arington: [name] Other Clinician: Referring Kijana Cromie: [name] Treating Gwendolin Briel/Extender: Melburn Hake, [name]: 116 Active Problems Location of Pain Severity and Description of Pain Patient Has Paino Yes Site Locations Pain Location: Pain in Ulcers With Dressing Change: Yes Duration of the Pain. Constant / Intermittento Constant Character of Pain Describe the Pain: Other: stinging Pain Management and Medication Current Pain Management: Electronic Signature(s) Signed: 03/01/2019  5:14:47 PM By: Montey Hora Entered By: Montey Hora on 03/01/2019 08:35:38 [name] ([id number]) -------------------------------------------------------------------------------- Patient/Caregiver Education Details Patient Name: [name] Date of Service: 03/01/2019 8:30 AM Medical Record Number: [id number] Patient Account Number: [id number] Date of Birth/Gender: [birth year] (38  y.o. M) Treating RN: Harold Barban Primary Care Physician: [name] Other Clinician: Referring Physician: [name] Treating Physician/Extender: Sharalyn Ink in Treatment: 116 Education Assessment Education Provided To: Patient Education Topics Provided Venous: Handouts: Controlling Swelling with Compression Stockings Methods: Demonstration, Explain/Verbal Responses: State content correctly Wound/Skin Impairment: Handouts: Caring for Your Ulcer Methods: Demonstration, Explain/Verbal Responses: State content correctly Electronic Signature(s) Signed: 03/03/2019 4:37:37 PM By: Harold Barban Entered By: Harold Barban on 03/01/2019 08:45:22 Eriksson, [name] ([id number]) -------------------------------------------------------------------------------- Wound Assessment Details Patient Name: [name] Date of Service: 03/01/2019 8:30 AM Medical Record Number: [id number] Patient Account Number: [id number] Date of Birth/Sex: 08-09-1980 (38 y.o. M) Treating RN: Montey Hora Primary Care Lilian Fuhs: [name] Other Clinician: Referring Jorje Vanatta: [name] Treating Ronia Hazelett/Extender: Melburn Hake, [name]: 116 Wound Status Wound Number: 1 Primary Etiology: Pyoderma Wound Location: Left Lower Leg - Lateral Wound Status: Open Wounding Event: Gradually Appeared Comorbid History: Sleep Apnea, Hypertension, Colitis Date Acquired: 11/18/2015 Weeks Of Treatment: 116 Clustered Wound: No Photos Wound Measurements Length: (cm) 4 Width: (cm) 3.5 Depth: (cm) 0.4 Area: (cm) 10.996 Volume: (cm) 4.398 % Reduction in Area: -124% % Reduction in Volume: -12% Epithelialization: None Tunneling: No Undermining: No Wound Description Full Thickness With Exposed Support Foul Odor Classification: Structures Slough/Fib Wound Margin: Distinct, outline attached Exudate Medium Amount: Exudate Type: Serous Exudate Color: amber After  Cleansing: No rino Yes Wound Bed Granulation Amount: Small (1-33%) Exposed Structure Granulation Quality: Red Fascia Exposed: No Necrotic Amount: Large (67-100%) Fat Layer (Subcutaneous Tissue) Exposed: Yes Necrotic Quality: Adherent Slough Tendon Exposed: No Muscle Exposed: No Joint Exposed: No Bone Exposed: No [name], Angie ([id number]) Treatment Notes Wound #1 (Left, Lateral Lower Leg) Notes TCA to wound bed only, BFD Electronic Signature(s) Signed: 03/01/2019 5:14:47 PM By: Montey Hora Entered By: Montey Hora on 03/01/2019 08:37:35 Maready, [name] ([id number]) -------------------------------------------------------------------------------- [place] Details Patient Name: [name] Date of Service: 03/01/2019 8:30 AM Medical Record Number: [id number] Patient Account Number: [id number] Date of Birth/Sex: 13-Aug-1980 (38 y.o. M) Treating RN: Montey Hora Primary Care Drako Maese: [name] Other Clinician: Referring Lundon Verdejo: [name] Treating Yer Castello/Extender: Melburn Hake, [name]: 116 Vital Signs Time Taken: 08:35 Temperature (F): 98.6 Height (in): 71 Pulse (bpm): 64 Weight (lbs): 338 Respiratory Rate (breaths/min): 16 Body Mass Index (BMI): 47.1 Blood Pressure (mmHg): 139/76 Reference Range: 80 - 120 mg / dl Electronic Signature(s) Signed: 03/01/2019 5:14:47 PM By: Montey Hora Entered By: Montey Hora on 03/01/2019 08:36:38

## 2019-03-04 ENCOUNTER — Encounter: Payer: Self-pay | Admitting: Internal Medicine

## 2019-03-04 ENCOUNTER — Other Ambulatory Visit: Payer: Self-pay

## 2019-03-04 ENCOUNTER — Ambulatory Visit (INDEPENDENT_AMBULATORY_CARE_PROVIDER_SITE_OTHER): Payer: 59 | Admitting: Internal Medicine

## 2019-03-04 VITALS — BP 132/72 | HR 97 | Temp 98.1°F | Ht 71.0 in | Wt 360.8 lb

## 2019-03-04 DIAGNOSIS — G4733 Obstructive sleep apnea (adult) (pediatric): Secondary | ICD-10-CM

## 2019-03-04 NOTE — Progress Notes (Signed)
[facility] [facility] Consultation     SYNOPSIS [name] is a 38 y.o. old male  He has been diagnosed with OSA, about 7 years ago. At that time he was having a lot of snoring, he was having trouble staying awake. He had a sleep study with an AHI of 48. Since starting on CPAP he has been doing well, he stopped snoring.  He goes to bed at 11 pm, wakes at 630 am. He does not nap during the day.  Currently he feels that his cpap is getting old, it is not working well, and does not always blowing pressure properly and not turning on properly.   Date: 03/04/2019  MRN# [id number] Yahya Boldman 05-03-1981  Referring Physician: Gentry Fitz, NP.   Yeng Perz is a 38 y.o. old male seen in consultation for chief complaint of: Daytime sleepiness.     CC follow up OSA  HPI:  Patient is here for follow-up sleep study and sleep assessment Sleep study results from July 2020 AHI of 55 AHI of 79 when supine Episodic desaturations 355 times Diagnosis of severe struct of sleep apnea  Patient was started on auto CPAP therapy 5 to 20 cm water pressure He has excellent compliance report 100% Patient uses and greatly benefits from therapy   More energy Less fatigued More active  No evidence of heart failure at this time No evidence or signs of infection at this time No respiratory distress No fevers, chills, nausea, vomiting, diarrhea No evidence of lower extremity edema No evidence hemoptysis   PMHX:   Past Medical History:  Diagnosis Date  . Blood in stool   . Depression   . Elevated blood pressure   . Hyperlipidemia   . OSA (obstructive sleep apnea) 2014  . Seasonal allergies   . Ulcerative colitis ([place]) 03/05/2018   Surgical Hx:  Past Surgical History:  Procedure Laterality Date  . SEPTOPLASTY  2007  . WRIST SURGERY     Family Hx:  Family History  Problem Relation Age of Onset  . Alcohol abuse Paternal Aunt   . Alcohol abuse Paternal Uncle   . Stroke Paternal  Uncle   . Hyperlipidemia Maternal Grandfather   . Hypertension Maternal Grandfather   . Diabetes Maternal Grandfather   . Alcohol abuse Maternal Grandfather   . Multiple sclerosis Mother   . Dementia Mother    Social Hx:   Social History   Tobacco Use  . Smoking status: Former Research scientist (life sciences)  . Smokeless tobacco: Former Systems developer    Quit date: 03/17/2015  . Tobacco comment: Quit in [place] smoker for 9 years  Substance Use Topics  . Alcohol use: Yes    Alcohol/week: 0.0 standard drinks    Comment: soical  . Drug use: Not on file   Medication:    Current Outpatient Medications:  .  acetaminophen (TYLENOL) 500 MG tablet, Take 1,000 mg by mouth every 6 (six) hours as needed for headache (pain)., Disp: , Rfl:  .  cetirizine (ZYRTEC) 10 MG tablet, Take 1 tablet (10 mg total) by mouth daily., Disp: 90 tablet, Rfl: 1 .  clobetasol ointment (TEMOVATE) 0.51 %, Apply 1 application topically 2 (two) times daily. , Disp: , Rfl:  .  doxycycline (VIBRA-TABS) 100 MG tablet, Take 100 mg by mouth 2 (two) times daily. , Disp: , Rfl:  .  fluticasone (FLONASE) 50 MCG/ACT nasal spray, Place 1 spray into both nostrils 2 (two) times daily., Disp: 16 g, Rfl: 6 .  hydrocortisone 2.5 %  cream, Apply 1 application topically 2 (two) times daily., Disp: , Rfl:  .  ibuprofen (ADVIL,MOTRIN) 200 MG tablet, Take 400-600 mg by mouth every 6 (six) hours as needed for headache (pain)., Disp: , Rfl:  .  metFORMIN (GLUCOPHAGE) 1000 MG tablet, Take 1 tablet (1,000 mg total) by mouth 2 (two) times daily with a meal. NEED APPOINTMENT FOR ANY MORE REFILLS, Disp: 180 tablet, Rfl: 0 .  mupirocin ointment (BACTROBAN) 2 %, Apply topically two (2) 60g times a day. Mix with clobetasol and apply twice daily, Disp: , Rfl:  .  predniSONE (DELTASONE) 10 MG tablet, As directed, Disp: , Rfl:  .  varenicline (CHANTIX STARTING MONTH PAK) 0.5 MG X 11 & 1 MG X 42 tablet, Take 0.5 mg by mouth once daily for 3 days, then increase to 0.5 mg twice  daily for 4 days, then increase to 1 mg tablet twice daily., Disp: 53 tablet, Rfl: 0 .  venlafaxine XR (EFFEXOR-XR) 150 MG 24 hr capsule, TAKE 1 CAPSULE BY MOUTH  DAILY WITH BREAKFAST, Disp: 90 capsule, Rfl: 1   Allergies:  Biaxin [clarithromycin] and Milk-related compounds   Review of Systems:  Gen:  Denies  fever, sweats, chills weight loss  HEENT: Denies blurred vision, double vision, ear pain, eye pain, hearing loss, nose bleeds, sore throat Cardiac:  No dizziness, chest pain or heaviness, chest tightness,edema, No JVD Resp:   No cough, -sputum production, -shortness of breath,-wheezing, -hemoptysis,  Gi: Denies swallowing difficulty, stomach pain, nausea or vomiting, diarrhea, constipation, bowel incontinence Gu:  Denies bladder incontinence, burning urine Ext:   Denies Joint pain, stiffness or swelling Skin: Denies  skin rash, easy bruising or bleeding or hives Endoc:  Denies polyuria, polydipsia , polyphagia or weight change Psych:   Denies depression, insomnia or hallucinations  Other:  All other systems negative   Physical Examination:   GENERAL:NAD, no fevers, chills, no weakness no fatigue HEAD: Normocephalic, atraumatic.  EYES: PERLA, EOMI No scleral icterus.  MOUTH: Moist mucosal membrane.  EAR, NOSE, THROAT: Clear without exudates. No external lesions.  NECK: Supple. No thyromegaly.  No JVD.  PULMONARY: CTA B/L no wheezing, rhonchi, crackles CARDIOVASCULAR: S1 and S2. Regular rate and rhythm. No murmurs GASTROINTESTINAL: Soft, nontender, nondistended. Positive bowel sounds.  MUSCULOSKELETAL: No swelling, clubbing, or edema.  NEUROLOGIC: No gross focal neurological deficits. 5/5 strength all extremities SKIN: No ulceration, lesions, rashes, or cyanosis.  PSYCHIATRIC: Insight, judgment intact. -depression -anxiety ALL OTHER ROS ARE NEGATIVE     Assessment and Plan:  Obstructive sleep apnea with excessive daytime sleepiness. Patient with severe obstructive sleep  apnea Patient currently on auto CPAP 5 to 20 cm of water pressure Patient has excellent compliance report 100% for days and greater than 4 hours AHI is reduced to 2.6(sleep study AHI was 55, 79 in supine position) Patient uses nasal pillows and doing well Patient uses and greatly benefits of CPAP therapy His obstructive sleep apnea is well controlled with therapy  Obesity -recommend significant weight loss -recommend changing diet  Deconditioned state -Recommend increased daily activity and exercise    COVID-19 EDUCATION: The signs and symptoms of COVID-19 were discussed with the patient and how to seek care for testing.  The importance of social distancing was discussed today. Hand Washing Techniques and avoid touching face was advised.     MEDICATION ADJUSTMENTS/LABS AND TESTS ORDERED: Continue auto CPAP as prescribed Recommend Weight Loss   CURRENT MEDICATIONS REVIEWED AT LENGTH WITH PATIENT TODAY   Patient satisfied with Plan  of action and management. All questions answered  Follow up in 1 year   Alejandro Gamel Patricia Pesa, M.D.  [facility]  Medical Director [place] Director [facility] Cardio-Pulmonary Department

## 2019-03-04 NOTE — Patient Instructions (Signed)
Continue auto CPAP as prescribed

## 2019-03-08 ENCOUNTER — Other Ambulatory Visit: Payer: Self-pay

## 2019-03-08 ENCOUNTER — Encounter: Payer: 59 | Admitting: Physician Assistant

## 2019-03-08 DIAGNOSIS — L97222 Non-pressure chronic ulcer of left calf with fat layer exposed: Secondary | ICD-10-CM | POA: Diagnosis not present

## 2019-03-08 NOTE — Progress Notes (Addendum)
MAESON, PUROHIT ([id number]) Visit Report for 03/08/2019 Chief Complaint Document Details Patient Name: [name] Date of Service: 03/08/2019 8:45 AM Medical Record Number: [id number] Patient Account Number: [id number] Date of Birth/Sex: 1980/06/13 (38 y.o. M) Treating RN: Harold Barban Primary Care Provider: [name] Other Clinician: Referring Provider: [name] Treating Provider/Extender: Melburn Hake, Aveion Nguyen Weeks in Treatment: 117 Information Obtained from: Patient Chief Complaint He is here in follow up evaluation for LLE pyoderma ulcer Electronic Signature(s) Signed: 03/08/2019 8:56:00 AM By: Worthy Keeler PA-C Entered By: Worthy Keeler on 03/08/2019 08:56:00 NEHEMYAH, FOUSHEE ([id number]) -------------------------------------------------------------------------------- HPI Details Patient Name: [name] Date of Service: 03/08/2019 8:45 AM Medical Record Number: [id number] Patient Account Number: [id number] Date of Birth/Sex: [birth year] (38 y.o. M) Treating RN: Harold Barban Primary Care Provider: [name] Other Clinician: Referring Provider: [name] Treating Provider/Extender: Melburn Hake, Kaylub Detienne Weeks in Treatment: 117 History of Present Illness HPI Description: 12/04/16; 38 year old man who comes into the clinic today for review of a wound on the posterior left calf. He tells me that is been there for about a year. He is not a diabetic he does smoke half a pack per day. He was seen in the ER on 11/20/16 felt to have cellulitis around the wound and was given clindamycin. An x-ray did not show osteomyelitis. The patient initially tells me that he has a milk allergy that sets off a pruritic itching rash on his lower legs which she scratches incessantly and he thinks that's what may have set up the wound. He has been using various topical antibiotics and ointments without any effect. He works in a [employer] and is on his feet all day. He does not  have a prior history of wounds however he does have the rash on both lower legs the right arm and the ventral aspect of his left arm. These are excoriations and clearly have had scratching however there are of macular looking areas on both legs including a substantial larger area on the right leg. This does not have an underlying open area. There is no blistering. The patient tells me that 2 years ago in [place] in response to the rash on his legs he saw a dermatologist who told him he had a condition which may be pyoderma gangrenosum although I may be putting words into his mouth. He seemed to recognize this. 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 He is not in any treatment for this. He's had no recent travel 12/11/16; the patient arrives today with his wound and roughly the same condition we've been using silver alginate this is a deep punched out wound with some surrounding erythema but no tenderness. Biopsy I did did not show confirmed pyoderma gangrenosum suggested nonspecific inflammation and vasculitis but does not provide an actual description of what was seen by the pathologist. I'm really not able to understand this We have also received information from the patient's dermatologist in [place] notes from April 2016. This was a doctor Agarwal- antal. 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 He was prescribed topical steroid high potency under occlusion which helped but at this point the patient did not have a deep punched out wound. 12/18/16; the patient's wound is larger in terms of surface area however this surface looks better and there is less depth. The surrounding erythema also is better. The patient states that the wrap we put on came off 2 days ago when he has been using  his compression stockings. He we are in the process of getting a dermatology consult. 12/26/16 on evaluation today patient's left lower extremity wound shows  evidence of infection with surrounding erythema noted. He has been tolerating the dressing changes but states that he has noted more discomfort. There is a larger area of erythema surrounding the wound. No fevers, chills, nausea, or vomiting noted at this time. With that being said the wound still does have slough covering the surface. He is not allergic to any medication that he is aware of at this point. In regard to his right lower extremity he had several regions that are erythematous and pruritic he wonders if there's anything we can do to help that. 01/02/17 I reviewed patient's wound culture which was obtained his visit last week. He was placed on doxycycline at that point. Unfortunately that does not appear to be an antibiotic that would likely help with the situation however the pseudomonas noted on culture is sensitive to Cipro. Also unfortunately patient's wound seems to have a large compared to last week's evaluation. Not severely so but there are definitely increased measurements in general. He is continuing to have discomfort as well he writes this to be a seven out of 10. In fact he would prefer me not to perform any debridement today due to the fact that he is having discomfort and considering he has an active infection on the little reluctant to do so anyway. No fevers, chills, nausea, or vomiting noted at this time. 01/08/17; patient seems dermatology on September 5. I suspect dermatology will want the slides from the biopsy I did sent to their pathologist. I'm not sure if there is a way we can expedite that. In any case the culture I did before I left on vacation 3 weeks ago showed Pseudomonas he was given 10 days of Cipro and per her description of her intake nurses is actually somewhat better this week although the wound is quite a bit bigger than I remember the last time I saw this. He still has 3 more days of Cipro TRELL, SECRIST ([id number]) 01/21/17; dermatology appointment  tomorrow. He has completed the ciprofloxacin for Pseudomonas. Surface of the wound looks better however he is had some deterioration in the lesions on his right leg. Meantime the left lateral leg wound we will continue with sample 01/29/17; patient had his dermatology appointment but I can't yet see that note. He is completed his antibiotics. The wound is more superficial but considerably larger in circumferential area than when he came in. This is in his left lateral calf. He also has swollen erythematous areas with superficial wounds on the right leg and small papular areas on both arms. There apparently areas in her his upper thighs and buttocks I did not look at those. Dermatology biopsied the right leg. Hopefully will have their input next week. 02/05/17; patient went back to see his dermatologist who told him that he had a "scratching problem" as well as staph. He is now on a 30 day course of doxycycline and I believe she gave him triamcinolone cream to the right leg areas to help with the itching [not exactly sure but probably triamcinolone]. She apparently looked at the left lateral leg wound although this was not rebiopsied and I think felt to be ultimately part of the same pathogenesis. He is using sample border foam and changing nevus himself. He now has a new open area on the right posterior leg which was his biopsy site I don't  have any of the dermatology notes 02/12/17; we put the patient in compression last week with SANTYL to the wound on the left leg and the biopsy. Edema is much better and the depth of the wound is now at level of skin. Area is still the same oBiopsy site on the right lateral leg we've also been using santyl with a border foam dressing and he is changing this himself. 02/19/17; Using silver alginate started last week to both the substantial left leg wound and the biopsy site on the right wound. He is tolerating compression well. Has a an appointment with his primary  M.D. tomorrow wondering about diuretics although I'm wondering if the edema problem is actually lymphedema 02/26/17; the patient has been to see his primary doctor Dr. Jerrel Ivory at [facility] our primary care. She started him on Lasix 20 mg and this seems to have helped with the edema. However we are not making substantial change with the left lateral calf wound and inflammation. The biopsy site on the right leg also looks stable but not really all that different. 03/12/17; the patient has been to see vein and vascular Dr. Lucky Cowboy. He has had venous reflux studies I have not reviewed these. I did get a call from his dermatology office. 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 They therefore recommended clobetasol and tetracycline i.e. 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 03/26/17; apparently the patient just had reflux studies not an appointment with Dr. dew. She arrives in clinic today having applied clobetasol for 2-3 weeks. He notes over the last 2-3 days excessive drainage having to change the dressing 3-4 times a day and also expanding erythema. 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 Lillia Carmel at [facility] dermatology locally who kindly saw him at my request. 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 My concern was the degree of erythema medially extending from his left leg wound which was either  inflammation from pyoderma or cellulitis. I put him on Augmentin however culture of the wound showed Pseudomonas which is quinolone sensitive. I really don't believe he has cellulitis however in view of everything I will continue and give him a course of Cipro. He is also on doxycycline as an immune modulator for the pyoderma. In addition to his original wound on the left lateral leg with surrounding erythema he has a wound on the right posterior calf which was an original biopsy site done by dermatology. 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 Saw Dr. Lillia Carmel yesterday. He has been using topical antibiotics to both wound areas his original wound on the left and the biopsies/pathergy area on the right. There is definitely some improvement in the inflammation around the wound on the right although the patient states he has increasing sensitivity of the wounds. He is on prednisone 60 and doxycycline 1 as prescribed by Dr. Lillia Carmel. He is covering the topical  antibiotic with gauze and putting this in his own compression stocks and changing this daily. He states that Dr. Lottie Rater did a culture of the left leg wound yesterday 05/07/17; pyoderma gangrenosum. The patient saw Dr. Lillia Carmel yesterday and has a follow-up with her in one month. He is still using topical antibiotics to both wounds although he can't recall exactly what type. He is still on prednisone 60 mg. Dr. Lillia Carmel stated that the doxycycline could stop if we were in agreement. 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 The right medial leg was induced by biopsy/pathergy. The area on the right is essentially healed. 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 The right medial leg has  closed and remains closed. He is still on prednisone 60. oHe tells me he missed his last dermatology appointment with Dr. Lillia Carmel but will make another appointment. He reports that her blood sugar at a recent screen in [place] was high 200's. He was 180 today. He is more cushingoid blood pressure is Ingalsbe, Jewel ([id number]) up a bit. I think he is going to require still much longer prednisone perhaps another 3 months before attempting to taper. In the meantime his wound is a lot better. Smaller. He is cleaning this off daily and applying topical antibiotics. When he was last in the clinic I thought about changing to [facility] and actually put in a couple of calls to dermatology although probably not during their business hours. 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 He continues on oral prednisone. He has been using triple antibiotic ointment. There is surface debris and [facility] and have him return in 2 weeks. He has lost 30 pounds since his last appointment with lifestyle modification. He may benefit from topical steroid cream for treatment this can be considered at a later date. 08/22/17 on evaluation today patient appears to actually be doing rather well in regard to his left lateral lower extremity ulcer. He has actually been managed by Dr. Dellia Nims most recently. Patient is currently on oral steroids at this time. This seems to have been of benefit for him. Nonetheless his last visit was actually with Leah on 08/06/17. Currently he is not utilizing any topical steroid creams although this could be of benefit as well. No fevers, chills, nausea, or vomiting noted at this time. 09/05/17 on evaluation today patient appears to be doing better in regard to his left lateral lower extremity ulcer. He has been tolerating the dressing changes without  complication. He is using Santyl with good effect. Overall I'm very pleased with how things are standing at this point. Patient likewise is happy that this is doing better. 09/19/17 on evaluation today patient actually appears to be doing rather well in regard to his left lateral lower extremity ulcer. 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 He's not having any significant pain. 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 He has been tolerating the Santyl without complication and in general I feel like he's making good  progress. 10/17/17 on evaluation today patient appears to be doing very well in regard to his left lateral lower surety ulcer. He has been tolerating the dressing changes without complication. There does not appear to be any evidence of infection he's alternating the Santyl and the triple antibiotic ointment every other day this seems to be doing well for him. 11/03/17 on evaluation today patient appears to be doing very well in regard to his left lateral lower extremity ulcer. He is been tolerating the dressing changes without complication which is good news. Fortunately there does not appear to be any evidence of infection which is also great news. Overall is doing excellent they are starting to taper down on the prednisone is down to 40 mg at this point it also started topical clobetasol for him. 11/17/17 on evaluation today patient appears to be doing well in regard to his left lateral lower surety ulcer. He's been tolerating the dressing changes without complication. He does note that he is having no pain, no excessive drainage or discharge, and overall he feels like things are going about how he would expect and hope they would. Overall he seems to have no evidence of infection at this time in my  opinion which is good news. 12/04/17-He is seen in follow-up evaluation for right lateral lower extremity ulcer. He has been applying topical steroid cream. Today's measurement show slight increase in size. Over the next 2 weeks we will transition to every other day Santyl and steroid cream. He has been encouraged to monitor for changes and notify clinic with any concerns 12/15/17 on evaluation today patient's left lateral motion the ulcer and fortunately is doing worse again at this point. This just since last week to this week has close to doubled in size according to the patient. I did not seeing last week's I do not have a visual to compare this to in our system was also down so we do not have all the charts and at this point. Nonetheless it does have me somewhat concerned in regard to the fact that again he was worried enough about it he has contact the dermatology that placed them back on the full strength, 50 mg a day of the prednisone that he was taken previous. He continues to alternate using clobetasol along with Santyl at this point. He is obviously somewhat frustrated. 12/22/17 on evaluation today patient appears to be doing a little worse compared to last evaluation. Unfortunately the wound is a little deeper and slightly larger than the last week's evaluation. With that being said he has made some progress in regard to the irritation surrounding at this time unfortunately despite that progress that's been made he still has a significant issue going on here. 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 For that reason I discussed with him today the possibility of perform a wound culture to ensure there's nothing overtly infected. 01/06/18 on evaluation today patient's wound is larger and deeper than previously evaluated. With that being said it  did appear that his wound was infected after my last evaluation with him. Subsequently I did end up prescribing a prescription for Bactrim DS which she has been taking and having no complication with. Fortunately there does not appear to be any evidence of Khalid, [name] ([id number]) infection at this point in time as far as anything spreading, no want to  touch, and overall I feel like things are showing signs of improvement. 01/13/18 on evaluation today patient appears to be even a little larger and deeper than last time. There still muscle exposed in the base of the wound. Nonetheless he does appear to be less erythematous I do believe inflammation is calming down also believe the infection looks like it's probably resolved at this time based on what I'm seeing. No fevers, chills, nausea, or vomiting noted at this time. 01/30/18 on evaluation today patient actually appears to visually look better for the most part. Unfortunately those visually this looks better he does seem to potentially have what may be an abscess in the muscle that has been noted in the central portion of the wound. This is the first time that I have noted what appears to be fluctuance in the central portion of the muscle. With that being said I'm somewhat more concerned about the fact that this might indicate an abscess formation at this location. I do believe that an ultrasound would be appropriate. This is likely something we need to try to do as soon as possible. He has been switch to mupirocin ointment and he is no longer using the steroid ointment as prescribed by dermatology he sees them again next week he's been decreased from 60 to 40 mg of prednisone. 03/09/18 on evaluation today patient actually appears to be doing a little better compared to last time I saw him. There's not as much erythema surrounding the wound itself. He I did review his most recent infectious disease note which was dated 02/24/18. He saw Dr. Michel Bickers in [place]. 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 Patient is now off of antibiotics and they are continually observing this. There seems to be no change in the past two weeks in my pinion based on what the patient says and what I see today compared to what Dr. Megan Salon likely saw two weeks ago. No fevers, chills, nausea, or vomiting noted at this time. 03/23/18 on evaluation today patient's wound actually appears to be showing signs of improvement which is good news. He is currently still on the Dapsone. He is also working on tapering the prednisone to get off of this and Dr. Lottie Rater is working with him in this regard. Nonetheless overall I feel like the wound is doing well it does appear based on the infectious disease note that I reviewed from Dr. 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 I did also review the results of his ultrasound of left lower extremity which revealed there was a dentist tissue in the base of the wound without an abscess noted. 04/06/18 on evaluation today the patient's left lateral lower extremity ulcer actually appears to be doing fairly well which is excellent news. There does not appear to be any evidence of infection at this time which is also great news. Overall he still does have a significantly large ulceration although little by little he seems to be making progress. He is down to 10 mg a day of the prednisone. 04/20/18 on evaluation today patient actually appears to be doing excellent at this time in regard to his left lower extremity ulcer. He's making signs of good progress unfortunately this is taking much longer than we would really like  to see but nonetheless he is making progress. Fortunately there does not appear to be any evidence of  infection at this time. No fevers, chills, nausea, or vomiting noted at this time. The patient has not been using the Santyl due to the cost he hadn't got in this field yet. He's mainly been using the antibiotic ointment topically. Subsequently he also tells me that he really has not been scrubbing in the shower I think this would be helpful again as I told him it doesn't have to be anything too aggressive to even make it believe just enough to keep it free of some of the loose slough and biofilm on the wound surface. 05/11/18 on evaluation today patient's wound appears to be making slow but sure progress in regard to the left lateral lower extremity ulcer. He is been tolerating the dressing changes without complication. Fortunately there does not appear to be any evidence of infection at this time. He is still just using triple antibiotic ointment along with clobetasol occasionally over the area. He never got the Santyl and really does not seem to intend to in my pinion. 06/01/18 on evaluation today patient appears to be doing a little better in regard to his left lateral lower extremity ulcer. He states that overall he does not feel like he is doing as well with the Dapsone as he did with the prednisone. Nonetheless he sees his dermatologist later today and is gonna talk to them about the possibility of going back on the prednisone. Overall again I believe that the wound would be better if you would utilize Santyl but he really does not seem to be interested in going back to the [place] at this point. He has been using triple antibiotic ointment. 06/15/18 on evaluation today patient's wound actually appears to be doing about the same at this point. Fortunately there is no signs of infection at this time. He has made slight improvements although he continues to not really want to clean the wound bed at this point. He states that he just doesn't mess with it he doesn't want to cause any problems with  everything else he has going on. He has been on medication, antibiotics as prescribed by his dermatologist, for a staff infection of his lower extremities which is really drying out now and looking much better he tells me. Fortunately there is no sign of overall infection. KELAN, PRITT ([id number]) 06/29/18 on evaluation today patient appears to be doing well in regard to his left lateral lower surety ulcer all things considering. Fortunately his staff infection seems to be greatly improved compared to previous. He has no signs of infection and this is drying up quite nicely. He is still the doxycycline for this is no longer on cental, Dapsone, or any of the other medications. His dermatologist has recommended possibility of an infusion but right now he does not want to proceed with that. 07/13/18 on evaluation today patient appears to be doing about the same in regard to his left lateral lower surety ulcer. Fortunately there's no signs of infection at this time which is great news. Unfortunately he still builds up a significant amount of Slough/biofilm of the surface of the wound he still is not really cleaning this as he should be appropriately. Again I'm able to easily with saline and gauze remove the majority of this on the surface which if you would do this at home would likely be a dramatic improvement for him as far as getting the area  to improve. Nonetheless overall I still feel like he is making progress is just very slow. I think Santyl will be of benefit for him as well. Still he has not gotten this as of this point. 07/27/18 on evaluation today patient actually appears to be doing little worse in regards of the erythema around the periwound region of the wound he also tells me that he's been having more drainage currently compared to what he was experiencing last time I saw him. He states not quite as bad as what he had because this was infected previously but nonetheless is still appears  to be doing poorly. Fortunately there is no evidence of systemic infection at this point. The patient tells me that he is not going to be able to afford the Santyl. He is still waiting to hear about the infusion therapy with his dermatologist. Apparently she wants an updated colonoscopy first. 08/10/18 on evaluation today patient appears to be doing better in regard to his left lateral lower extremity ulcer. Fortunately he is showing signs of improvement in this regard he's actually been approved for Remicade infusion's as well although this has not been scheduled as of yet. Fortunately there's no signs of active infection at this time in regard to the wound although he is having some issues with infection of the right lower extremity is been seen as dermatologist for this. Fortunately they are definitely still working with him trying to keep things under control. 09/07/18 on evaluation today patient is actually doing rather well in regard to his left lateral lower extremity ulcer. He notes these actually having some hair grow back on his extremity which is something he has not seen in years. He also tells me that the pain is really not giving them any trouble at this time which is also good news overall she is very pleased with the progress he's using a combination of the mupirocin along with the probate is all mixed. 09/21/18 on evaluation today patient actually appears to be doing fairly well all things considered in regard to his looks from the ulcer. He's been tolerating the dressing changes without complication. Fortunately there's no signs of active infection at this time which is good news he is still on all antibiotics or prevention of the staff infection. He has been on prednisone for time although he states it is gonna contact his dermatologist and see if she put them on a short course due to some irritation that he has going on currently. Fortunately there's no evidence of any overall  worsening this is going very slow I think cental would be something that would be helpful for him although he states that $50 for tube is quite expensive. He therefore is not willing to get that at this point. 10/06/18 on evaluation today patient actually appears to be doing decently well in regard to his left lateral leg ulcer. He's been tolerating the dressing changes without complication. Fortunately there's no signs of active infection at this time. Overall I'm actually rather pleased with the progress he's making although it's slow he doesn't show any signs of infection and he does seem to be making some improvement. I do believe that he may need a switch up and dressings to try to help this to heal more appropriately and quickly. 10/19/18 on evaluation today patient actually appears to be doing better in regard to his left lateral lower extremity ulcer. This is shown signs of having much less Slough buildup at this point due to  the fact he has been using the Santyl. Obviously this is very good news. The overall size of the wound is not dramatically smaller but again the appearance is. 11/02/18 on evaluation today patient actually appears to be doing quite well in regard to his lower Trinity ulcer. A lot of the skin around the ulcer is actually somewhat irritating at this point this seems to be more due to the dressing causing irritation from the adhesive that anything else. Fortunately there is no signs of active infection at this time. 11/24/18 on evaluation today patient appears to be doing a little worse in regard to his overall appearance of his lower extremity ulcer. There's more erythema and warmth around the wound unfortunately. He is currently on doxycycline which he has been on for some time. With that being said I'm not sure that seems to be helping with what appears to possibly be an acute cellulitis with regard to his left lower extremity ulcer. No fevers, chills, nausea, or vomiting  noted at this time. 12/08/18 on evaluation today patient's wounds actually appears to be doing significantly better compared to his last evaluation. He has been using Santyl along with alternating tripling about appointment as well as the steroid cream seems to be doing Rini, Matheau ([id number]) quite well and the wound is showing signs of improvement which is excellent news. Fortunately there's no evidence of infection and in fact his culture came back negative with only normal skin flora noted. 12/21/2018 upon evaluation today patient actually appears to be doing excellent with regard to his ulcer. This is actually the best that I have seen it since have been helping to take care of him. It is both smaller as well as less slough noted on the surface of the wound and seems to be showing signs of good improvement with new skin growing from the edges. He has been using just the triamcinolone he does wonder if he can get a refill of that ointment today. 01/04/2019 upon evaluation today patient actually appears to be doing well with regard to his left lateral lower extremity ulcer. With that being said it does not appear to be that he is doing quite as well as last time as far as progression is concerned. There does not appear to be any signs of infection or significant irritation which is good news. With that being said I do believe that he may benefit from switching to a collagen based dressing based on how clean The wound appears. 01/18/2019 on evaluation today patient actually appears to be doing well with regard to his wound on the left lower extremity. He is not made a lot of progress compared to where we were previous but nonetheless does seem to be doing okay at this time which is good news. There is no signs of active infection which is also good news. My only concern currently is I do wish we can get him into utilizing the collagen dressing his insurance would not pay for the supplies that we  ordered although it appears that he may be able to order this through his supply company that he typically utilizes. This is [employer]. Nonetheless he did try to order it during the office visit today and it appears this did go through. We will see if he can get that it is a different brand but nonetheless he has collagen and I do think will be beneficial. 02/01/2019 on evaluation today patient actually appears to be doing a little worse today in regard  to the overall size of his wounds. Fortunately there is no signs of active infection at this time. That is visually. Nonetheless when this is happened before it was due to infection. For that reason were somewhat concerned about that this time as well. 02/08/2019 on evaluation today patient unfortunately appears to be doing slightly worse with regard to his wound upon evaluation today. Is measuring a little deeper and a little larger unfortunately. I am not really sure exactly what is causing this to enlarge he actually did see his dermatologist she is going to see about initiating Humira for him. Subsequently she also did do steroid injections into the wound itself in the periphery. Nonetheless still nonetheless he seems to be getting a little bit larger he is gone back to just using the steroid cream topically which I think is appropriate. I would say hold off on the collagen for the time being is definitely a good thing to do. Based on the culture results which we finally did get the final result back regarding it shows staph as the bacteria noted again that can be a normal skin bacteria based on the fact however he is having increased drainage and worsening of the wound measurement wise I would go ahead and place him on an antibiotic today I do believe for this. 02/15/2019 on evaluation today patient actually appears to be doing somewhat better in regard to his ulcer. There is no signs of worsening at this time I did review his culture results which  showed evidence of Staphylococcus aureus but not MRSA. Again this could just be more related to the normal skin bacteria although he states the drainage has slowed down quite a bit he may have had a mild infection not just colonization. And was much smaller and then since around10/04/2019 on evaluation today patient appears to be doing unfortunately worse as far as the size of the wound. I really feel like that this is steadily getting larger again it had been doing excellent right at the beginning of September we have seen a steady increase in the area of the wound it is almost 2-1/2 times the size it was on September 1. Obviously this is a bad trend this is not wanting to see. For that reason we went back to using just the topical triamcinolone cream which does seem to help with inflammation. I checked him for bacteria by way of culture and nothing showed positive there. I am considering giving him a short course of a tapering steroid Dosepak today to see if that is can be beneficial for him. The patient is in agreement with giving that a try. 03/08/2019 on evaluation today patient appears to be doing very well in comparison to last evaluation with regard to his lower extremity ulcer. This is showing signs of less inflammation and actually measuring slightly smaller compared to last time every other week over the past month and a half he has been measuring larger larger larger. Nonetheless I do believe that the issue has been inflammation the prednisone does seem to have been beneficial for him which is good news. No fevers, chills, nausea, vomiting, or diarrhea. Electronic Signature(s) Signed: 03/08/2019 9:12:51 AM By: Worthy Keeler PA-C Entered By: Worthy Keeler on 03/08/2019 09:12:50 Mitch, Tremon ([id number]) MACEDONIO, SCALLON ([id number]) -------------------------------------------------------------------------------- Physical Exam Details Patient Name: [name] Date of Service:  03/08/2019 8:45 AM Medical Record Number: [id number] Patient Account Number: [id number] Date of Birth/Sex: 1981/03/09 (38 y.o. M) Treating RN: Harold Barban Primary  Care Provider: [name] Other Clinician: Referring Provider: [name] Treating Provider/Extender: Melburn Hake, Connar Keating Weeks in Treatment: 45 Constitutional Well-nourished and well-hydrated in no acute distress. Respiratory normal breathing without difficulty. clear to auscultation bilaterally. Cardiovascular regular rate and rhythm with normal S1, S2. Psychiatric this patient is able to make decisions and demonstrates good insight into disease process. Alert and Oriented x 3. pleasant and cooperative. Notes Upon inspection today patient's wound bed actually showed signs of good granulation at this time. Fortunately there is no signs of active infection which is good news. No fevers, chills, nausea, vomiting, or diarrhea. The wound bed appears still to show muscle and the base of the wound although there is much less inflammation than previously noted at last evaluation. I do believe the prednisone has helped him quite a bit. Electronic Signature(s) Signed: 03/08/2019 9:13:36 AM By: Worthy Keeler PA-C Entered By: Worthy Keeler on 03/08/2019 09:13:35 [name] ([id number]) -------------------------------------------------------------------------------- Physician Orders Details Patient Name: [name] Date of Service: 03/08/2019 8:45 AM Medical Record Number: [id number] Patient Account Number: [id number] Date of Birth/Sex: Sep 11, 1980 (38 y.o. 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 Anesthetic (add to Medication List) Wound #1 Left,Lateral Lower Leg o Topical Lidocaine 4% cream applied to wound bed prior to debridement (In Clinic Only). Primary Wound Dressing Wound #1 Left,Lateral Lower Leg o Other: - TCA to wound bed only Secondary Dressing Wound #1 Left,Lateral Lower Leg o Boardered Foam Dressing Dressing Change Frequency Wound #1 Left,Lateral Lower Leg o Change dressing every other day. Follow-up Appointments Wound #1 Left,Lateral Lower Leg o Return Appointment in 2 weeks. Edema Control Wound #1 Left,Lateral Lower Leg o Elevate legs to the level of the heart and pump ankles as often as possible Electronic Signature(s) Signed: 03/08/2019 4:50:44 PM By: Harold Barban Signed: 03/08/2019 6:08:25 PM By: Worthy Keeler PA-C Entered By: Harold Barban on 03/08/2019 09:10:11 Aoun, Jetson ([id number]) GIACOMO, VALONE ([id number]) -------------------------------------------------------------------------------- Problem List Details Patient Name: [name] Date of Service: 03/08/2019 8:45 AM Medical Record Number: [id number] Patient Account Number: [id number] Date of Birth/Sex: [birth year] (38 y.o. M) Treating RN: Harold Barban Primary Care Provider: [name] Other Clinician: Referring Provider: [name] Treating Provider/Extender: Melburn Hake, Azha Constantin Weeks in Treatment: 117 Active Problems ICD-10 Evaluated Encounter Code Description Active Date Today Diagnosis L97.222 Non-pressure chronic ulcer of left calf with fat layer exposed 12/04/2016 No Yes L88 Pyoderma gangrenosum 03/26/2017 No Yes I87.2 Venous insufficiency (chronic) (peripheral) 12/04/2016 No Yes Inactive Problems ICD-10 Code Description Active Date Inactive Date L97.213 Non-pressure chronic ulcer of right calf with necrosis  of muscle 04/02/2017 04/02/2017 Resolved Problems Electronic Signature(s) Signed: 03/08/2019 8:55:53 AM By: Worthy Keeler PA-C Entered By: Worthy Keeler on 03/08/2019 08:55:53 Overbey, Markey ([id number]) -------------------------------------------------------------------------------- Progress Note Details Patient Name: [name] Date of Service: 03/08/2019 8:45 AM Medical Record Number: [id number] Patient Account Number: [id number] Date of Birth/Sex: 1981/01/24 (38 y.o. M) Treating RN: Harold Barban Primary Care Provider: [name] Other Clinician: Referring Provider: [name] Treating Provider/Extender: Melburn Hake, [name] [name] Weeks in Treatment: 117 Subjective Chief Complaint Information obtained from Patient He is here in follow up evaluation for LLE  pyoderma ulcer History of Present Illness (HPI) 12/04/16; 38 year old man who comes into the clinic today for review of a wound on the posterior left calf. He tells me that is been there for about a year. He is not a diabetic he does smoke half a pack per day. He was seen in the ER on 11/20/16 felt to have cellulitis around the wound and was given clindamycin. An x-ray did not show osteomyelitis. The patient initially tells me that he has a milk allergy that sets off a pruritic itching rash on his lower legs which she scratches incessantly and he thinks that's what may have set up the wound. He has been using various topical antibiotics and ointments without any effect. He works in a [employer]ing [employer] and is on his feet all day. He does not have a prior history of wounds however he does have the rash on both lower legs the right arm and the ventral aspect of his left arm. These are excoriations and clearly have had scratching however there are of macular looking areas on both legs including a substantial larger area on the right leg. This does not have an underlying open area. There is no blistering. The patient tells me that 2  years ago in [place] in response to the rash on his legs he saw a dermatologist who told him he had a condition which may be pyoderma gangrenosum although I may be putting words into his mouth. He seemed to recognize this. 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 He is not in any treatment for this. He's had no recent travel 12/11/16; the patient arrives today with his wound and roughly the same condition we've been using silver alginate this is a deep punched out wound with some surrounding erythema but no tenderness. Biopsy I did did not show confirmed pyoderma gangrenosum suggested nonspecific inflammation and vasculitis but does not provide an actual description of what was seen by the pathologist. I'm really not able to understand this We have also received information from the patient's dermatologist in [place] notes from April 2016. This was a doctor Agarwal- antal. 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 He was prescribed topical steroid high potency under occlusion which helped but at this point the patient did not have a deep punched out wound. 12/18/16; the patient's wound is larger in terms of surface area however this surface looks better and there is less depth. The surrounding erythema also is better. The patient states that the wrap we put on came off 2 days ago when he has been using his compression stockings. He we are in the process of getting a dermatology consult. 12/26/16 on evaluation today patient's left lower extremity wound shows evidence of infection with surrounding erythema noted. He has been tolerating the dressing changes but states that he has noted more discomfort. There is a larger area of erythema surrounding the wound. No fevers, chills, nausea, or vomiting noted at this time. With that being said the wound still does have slough covering the surface. He is not allergic to any medication that he is aware of at  this point. In regard to his right lower extremity he had several regions that are erythematous and pruritic he wonders if there's anything we can do to help that. 01/02/17 I reviewed patient's wound culture which was obtained his visit last week. He was placed on doxycycline at that point. Unfortunately that does not appear to be  an antibiotic that would likely help with the situation however the pseudomonas noted on culture is sensitive to Cipro. Also unfortunately patient's wound seems to have a large compared to last week's evaluation. Not severely so but there are definitely increased measurements in general. He is continuing to have discomfort as well he writes this to be a seven out of 10. In fact he would prefer me not to perform any debridement today due to the fact that he is having discomfort and considering he has an active infection on the little reluctant to do so anyway. No fevers, Simones, Christipher ([id number]) chills, nausea, or vomiting noted at this time. 01/08/17; patient seems dermatology on September 5. I suspect dermatology will want the slides from the biopsy I did sent to their pathologist. I'm not sure if there is a way we can expedite that. In any case the culture I did before I left on vacation 3 weeks ago showed Pseudomonas he was given 10 days of Cipro and per her description of her intake nurses is actually somewhat better this week although the wound is quite a bit bigger than I remember the last time I saw this. He still has 3 more days of Cipro 01/21/17; dermatology appointment tomorrow. He has completed the ciprofloxacin for Pseudomonas. Surface of the wound looks better however he is had some deterioration in the lesions on his right leg. Meantime the left lateral leg wound we will continue with sample 01/29/17; patient had his dermatology appointment but I can't yet see that note. He is completed his antibiotics. The wound is more superficial but considerably larger in  circumferential area than when he came in. This is in his left lateral calf. He also has swollen erythematous areas with superficial wounds on the right leg and small papular areas on both arms. There apparently areas in her his upper thighs and buttocks I did not look at those. Dermatology biopsied the right leg. Hopefully will have their input next week. 02/05/17; patient went back to see his dermatologist who told him that he had a "scratching problem" as well as staph. He is now on a 30 day course of doxycycline and I believe she gave him triamcinolone cream to the right leg areas to help with the itching [not exactly sure but probably triamcinolone]. She apparently looked at the left lateral leg wound although this was not rebiopsied and I think felt to be ultimately part of the same pathogenesis. He is using sample border foam and changing nevus himself. He now has a new open area on the right posterior leg which was his biopsy site I don't have any of the dermatology notes 02/12/17; we put the patient in compression last week with SANTYL to the wound on the left leg and the biopsy. Edema is much better and the depth of the wound is now at level of skin. Area is still the same Biopsy site on the right lateral leg we've also been using santyl with a border foam dressing and he is changing this himself. 02/19/17; Using silver alginate started last week to both the substantial left leg wound and the biopsy site on the right wound. He is tolerating compression well. Has a an appointment with his primary M.D. tomorrow wondering about diuretics although I'm wondering if the edema problem is actually lymphedema 02/26/17; the patient has been to see his primary doctor Dr. Jerrel Ivory at [facility] our primary care. She started him on Lasix 20 mg and this seems to have  helped with the edema. However we are not making substantial change with the left lateral calf wound and inflammation. The biopsy site on  the right leg also looks stable but not really all that different. 03/12/17; the patient has been to see vein and vascular Dr. Lucky Cowboy. He has had venous reflux studies I have not reviewed these. I did get a call from his dermatology office. 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 They therefore recommended clobetasol and tetracycline i.e. 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 03/26/17; apparently the patient just had reflux studies not an appointment with Dr. dew. She arrives in clinic today having applied clobetasol for 2-3 weeks. He notes over the last 2-3 days excessive drainage having to change the dressing 3-4 times a day and also expanding erythema. 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 Lillia Carmel at [facility] dermatology locally who kindly saw him at my request. 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 My concern was the degree of erythema medially extending from his left leg wound which was either inflammation from pyoderma or cellulitis. I put him on Augmentin however culture of the wound showed Pseudomonas which is quinolone sensitive. I really don't believe he has cellulitis however in view of everything I will continue and give him a course of Cipro. He is also on doxycycline as an immune modulator for the pyoderma. In addition to his original wound on the left lateral leg with surrounding  erythema he has a wound on the right posterior calf which was an original biopsy site done by dermatology. 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 Saw Dr. Lillia Carmel yesterday. He has been using topical antibiotics to both wound areas his original wound on the left and the biopsies/pathergy area on the right. There is definitely some improvement in the inflammation around the wound on the right although the patient states he has increasing sensitivity of the wounds. He is on prednisone 60 and doxycycline 1 as prescribed by Dr. Lillia Carmel. He is covering the topical antibiotic with gauze and putting this in his own compression stocks and changing this daily. He states that Dr. Lottie Rater did a culture of the left leg wound yesterday 05/07/17; pyoderma gangrenosum. The patient saw Dr. Lillia Carmel yesterday and has a follow-up with her in one month. He is still using topical antibiotics to both wounds although he can't recall exactly what type. He is still on prednisone 60 mg. Dr. Lillia Carmel stated that the doxycycline could stop if we were in agreement. 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 The right medial leg was induced by [name] ([id number]) biopsy/pathergy. The area on the right is essentially healed. 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 The right medial leg  has closed and remains closed. He is still on prednisone 60. He tells me he missed his last dermatology appointment with Dr. Lillia Carmel but will make another appointment. He reports that her blood sugar at a recent screen in [place] was high 200's. He was 180 today. 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 I think he is going to require still much longer prednisone  perhaps another 3 months before attempting to taper. In the meantime his wound is a lot better. Smaller. He is cleaning this off daily and applying topical antibiotics. When he was last in the clinic I thought about changing to [facility] and actually put in a couple of calls to dermatology although probably not during their business hours. 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 He continues on oral prednisone. He has been using triple antibiotic ointment. There is surface debris and we will transition to [facility] and have him return in 2 weeks. He has lost 30 pounds since his last appointment with lifestyle modification. He may benefit from topical steroid cream for treatment this can be considered at a later date. 08/22/17 on evaluation today patient appears to actually be doing rather well in regard to his left lateral lower extremity ulcer. He has actually been managed by Dr. Dellia Nims most recently. Patient is currently on oral steroids at this time. This seems to have been of benefit for him. Nonetheless his last visit was actually with Leah on 08/06/17. Currently he is not utilizing any topical steroid creams although this could be of benefit as well. No fevers, chills, nausea, or vomiting noted at this time. 09/05/17 on evaluation today patient appears to be doing better in regard to his left lateral lower extremity ulcer. He has been tolerating the dressing changes without complication. He is using Santyl with good effect. Overall I'm very pleased with how things are standing at this point. Patient likewise is happy that this is doing better. 09/19/17 on evaluation today patient actually appears to be doing rather well in regard to his left lateral lower extremity ulcer. Again this is secondary to Pyoderma gangrenosum and he seems to be progressing well with the Santyl  which is good news. He's not having any significant pain. 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 He has been tolerating the Santyl without complication and in general I feel like he's making good progress. 10/17/17 on evaluation today patient appears to be doing very well in regard to his left lateral lower surety ulcer. He has been tolerating the dressing changes without complication. There does not appear to be any evidence of infection he's alternating the Santyl and the triple antibiotic ointment every other day this seems to be doing well for him. 11/03/17 on evaluation today patient appears to be doing very well in regard to his left lateral lower extremity ulcer. He is been tolerating the dressing changes without complication which is good news. Fortunately there does not appear to be any evidence of infection which is also great news. Overall is doing excellent they are starting to taper down on the prednisone is down to 40 mg at this point it also started topical clobetasol for him. 11/17/17 on evaluation today patient appears to be doing well in regard to  his left lateral lower surety ulcer. He's been tolerating the dressing changes without complication. He does note that he is having no pain, no excessive drainage or discharge, and overall he feels like things are going about how he would expect and hope they would. Overall he seems to have no evidence of infection at this time in my opinion which is good news. 12/04/17-He is seen in follow-up evaluation for right lateral lower extremity ulcer. He has been applying topical steroid cream. Today's measurement show slight increase in size. Over the next 2 weeks we will transition to every other day Santyl and steroid cream. He has been encouraged to monitor for changes and notify clinic with any concerns 12/15/17 on evaluation today  patient's left lateral motion the ulcer and fortunately is doing worse again at this point. This just since last week to this week has close to doubled in size according to the patient. I did not seeing last week's I do not have a visual to compare this to in our system was also down so we do not have all the charts and at this point. Nonetheless it does have me somewhat concerned in regard to the fact that again he was worried enough about it he has contact the dermatology that placed them back on the full strength, 50 mg a day of the prednisone that he was taken previous. He continues to alternate using clobetasol along with Santyl at this point. He is obviously somewhat frustrated. 12/22/17 on evaluation today patient appears to be doing a little worse compared to last evaluation. Unfortunately the wound is a little deeper and slightly larger than the last week's evaluation. With that being said he has made some progress in regard to the irritation surrounding at this time unfortunately despite that progress that's been made he still has a significant issue going on here. I'm not certain that he is having really any true infection at this time although with the Pyoderma gangrenosum it can Fehr, Legacy ([id number]) sometimes be difficult to differentiate infection versus just inflammation. For that reason I discussed with him today the possibility of perform a wound culture to ensure there's nothing overtly infected. 01/06/18 on evaluation today patient's wound is larger and deeper than previously evaluated. With that being said it did appear that his wound was infected after my last evaluation with him. Subsequently I did end up prescribing a prescription for Bactrim DS which she has been taking and having no complication with. Fortunately there does not appear to be any evidence of infection at this point in time as far as anything spreading, no want to touch, and overall I feel like things are  showing signs of improvement. 01/13/18 on evaluation today patient appears to be even a little larger and deeper than last time. There still muscle exposed in the base of the wound. Nonetheless he does appear to be less erythematous I do believe inflammation is calming down also believe the infection looks like it's probably resolved at this time based on what I'm seeing. No fevers, chills, nausea, or vomiting noted at this time. 01/30/18 on evaluation today patient actually appears to visually look better for the most part. Unfortunately those visually this looks better he does seem to potentially have what may be an abscess in the muscle that has been noted in the central portion of the wound. This is the first time that I have noted what appears to be fluctuance in the central portion of the muscle. With that  being said I'm somewhat more concerned about the fact that this might indicate an abscess formation at this location. I do believe that an ultrasound would be appropriate. This is likely something we need to try to do as soon as possible. He has been switch to mupirocin ointment and he is no longer using the steroid ointment as prescribed by dermatology he sees them again next week he's been decreased from 60 to 40 mg of prednisone. 03/09/18 on evaluation today patient actually appears to be doing a little better compared to last time I saw him. There's not as much erythema surrounding the wound itself. He I did review his most recent infectious disease note which was dated 02/24/18. He saw Dr. Michel Bickers in [place]. 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 Patient is now off of antibiotics and they are continually observing this. There seems to be no change in the past two weeks in my pinion based on what the patient says and what I see today compared to what Dr. Megan Salon likely saw two weeks ago.  No fevers, chills, nausea, or vomiting noted at this time. 03/23/18 on evaluation today patient's wound actually appears to be showing signs of improvement which is good news. He is currently still on the Dapsone. He is also working on tapering the prednisone to get off of this and Dr. Lottie Rater is working with him in this regard. Nonetheless overall I feel like the wound is doing well it does appear based on the infectious disease note that I reviewed from Dr. 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 I did also review the results of his ultrasound of left lower extremity which revealed there was a dentist tissue in the base of the wound without an abscess noted. 04/06/18 on evaluation today the patient's left lateral lower extremity ulcer actually appears to be doing fairly well which is excellent news. There does not appear to be any evidence of infection at this time which is also great news. Overall he still does have a significantly large ulceration although little by little he seems to be making progress. He is down to 10 mg a day of the prednisone. 04/20/18 on evaluation today patient actually appears to be doing excellent at this time in regard to his left lower extremity ulcer. He's making signs of good progress unfortunately this is taking much longer than we would really like to see but nonetheless he is making progress. Fortunately there does not appear to be any evidence of infection at this time. No fevers, chills, nausea, or vomiting noted at this time. The patient has not been using the Santyl due to the cost he hadn't got in this field yet. He's mainly been using the antibiotic ointment topically. Subsequently he also tells me that he really has not been scrubbing in the shower I think this would be helpful again as I told him it doesn't have to be anything too  aggressive to even make it believe just enough to keep it free of some of the loose slough and biofilm on the wound surface. 05/11/18 on evaluation today patient's wound appears to be making slow but sure progress in regard to the left lateral lower extremity ulcer. He is been tolerating the dressing changes without complication. Fortunately  there does not appear to be any evidence of infection at this time. He is still just using triple antibiotic ointment along with clobetasol occasionally over the area. He never got the Santyl and really does not seem to intend to in my pinion. 06/01/18 on evaluation today patient appears to be doing a little better in regard to his left lateral lower extremity ulcer. He states that overall he does not feel like he is doing as well with the Dapsone as he did with the prednisone. Nonetheless he sees his dermatologist later today and is gonna talk to them about the possibility of going back on the prednisone. Overall again I believe that the wound would be better if you would utilize Santyl but he really does not seem to be interested in going back to the [employer] at this point. He has been using triple antibiotic ointment. MALEIK, VANDERZEE ([id number]) 06/15/18 on evaluation today patient's wound actually appears to be doing about the same at this point. Fortunately there is no signs of infection at this time. He has made slight improvements although he continues to not really want to clean the wound bed at this point. He states that he just doesn't mess with it he doesn't want to cause any problems with everything else he has going on. He has been on medication, antibiotics as prescribed by his dermatologist, for a staff infection of his lower extremities which is really drying out now and looking much better he tells me. Fortunately there is no sign of overall infection. 06/29/18 on evaluation today patient appears to be doing well in regard to his left lateral lower  surety ulcer all things considering. Fortunately his staff infection seems to be greatly improved compared to previous. He has no signs of infection and this is drying up quite nicely. He is still the doxycycline for this is no longer on cental, Dapsone, or any of the other medications. His dermatologist has recommended possibility of an infusion but right now he does not want to proceed with that. 07/13/18 on evaluation today patient appears to be doing about the same in regard to his left lateral lower surety ulcer. Fortunately there's no signs of infection at this time which is great news. Unfortunately he still builds up a significant amount of Slough/biofilm of the surface of the wound he still is not really cleaning this as he should be appropriately. Again I'm able to easily with saline and gauze remove the majority of this on the surface which if you would do this at home would likely be a dramatic improvement for him as far as getting the area to improve. Nonetheless overall I still feel like he is making progress is just very slow. I think Santyl will be of benefit for him as well. Still he has not gotten this as of this point. 07/27/18 on evaluation today patient actually appears to be doing little worse in regards of the erythema around the periwound region of the wound he also tells me that he's been having more drainage currently compared to what he was experiencing last time I saw him. He states not quite as bad as what he had because this was infected previously but nonetheless is still appears to be doing poorly. Fortunately there is no evidence of systemic infection at this point. The patient tells me that he is not going to be able to afford the Santyl. He is still waiting to hear about the infusion therapy with his dermatologist. Apparently she wants  an updated colonoscopy first. 08/10/18 on evaluation today patient appears to be doing better in regard to his left lateral lower  extremity ulcer. Fortunately he is showing signs of improvement in this regard he's actually been approved for Remicade infusion's as well although this has not been scheduled as of yet. Fortunately there's no signs of active infection at this time in regard to the wound although he is having some issues with infection of the right lower extremity is been seen as dermatologist for this. Fortunately they are definitely still working with him trying to keep things under control. 09/07/18 on evaluation today patient is actually doing rather well in regard to his left lateral lower extremity ulcer. He notes these actually having some hair grow back on his extremity which is something he has not seen in years. He also tells me that the pain is really not giving them any trouble at this time which is also good news overall she is very pleased with the progress he's using a combination of the mupirocin along with the probate is all mixed. 09/21/18 on evaluation today patient actually appears to be doing fairly well all things considered in regard to his looks from the ulcer. He's been tolerating the dressing changes without complication. Fortunately there's no signs of active infection at this time which is good news he is still on all antibiotics or prevention of the staff infection. He has been on prednisone for time although he states it is gonna contact his dermatologist and see if she put them on a short course due to some irritation that he has going on currently. Fortunately there's no evidence of any overall worsening this is going very slow I think cental would be something that would be helpful for him although he states that $50 for tube is quite expensive. He therefore is not willing to get that at this point. 10/06/18 on evaluation today patient actually appears to be doing decently well in regard to his left lateral leg ulcer. He's been tolerating the dressing changes without complication.  Fortunately there's no signs of active infection at this time. Overall I'm actually rather pleased with the progress he's making although it's slow he doesn't show any signs of infection and he does seem to be making some improvement. I do believe that he may need a switch up and dressings to try to help this to heal more appropriately and quickly. 10/19/18 on evaluation today patient actually appears to be doing better in regard to his left lateral lower extremity ulcer. This is shown signs of having much less Slough buildup at this point due to the fact he has been using the [employer]. Obviously this is very good news. The overall size of the wound is not dramatically smaller but again the appearance is. 11/02/18 on evaluation today patient actually appears to be doing quite well in regard to his lower [place] ulcer. A lot of the skin around the ulcer is actually somewhat irritating at this point this seems to be more due to the dressing causing irritation from the adhesive that anything else. Fortunately there is no signs of active infection at this time. 11/24/18 on evaluation today patient appears to be doing a little worse in regard to his overall appearance of his lower extremity Leeds, Kennard ([id number]) ulcer. There's more erythema and warmth around the wound unfortunately. He is currently on doxycycline which he has been on for some time. With that being said I'm not sure that seems to be  helping with what appears to possibly be an acute cellulitis with regard to his left lower extremity ulcer. No fevers, chills, nausea, or vomiting noted at this time. 12/08/18 on evaluation today patient's wounds actually appears to be doing significantly better compared to his last evaluation. He has been using Santyl along with alternating tripling about appointment as well as the steroid cream seems to be doing quite well and the wound is showing signs of improvement which is excellent news. Fortunately there's  no evidence of infection and in fact his culture came back negative with only normal skin flora noted. 12/21/2018 upon evaluation today patient actually appears to be doing excellent with regard to his ulcer. This is actually the best that I have seen it since have been helping to take care of him. It is both smaller as well as less slough noted on the surface of the wound and seems to be showing signs of good improvement with new skin growing from the edges. He has been using just the triamcinolone he does wonder if he can get a refill of that ointment today. 01/04/2019 upon evaluation today patient actually appears to be doing well with regard to his left lateral lower extremity ulcer. With that being said it does not appear to be that he is doing quite as well as last time as far as progression is concerned. There does not appear to be any signs of infection or significant irritation which is good news. With that being said I do believe that he may benefit from switching to a collagen based dressing based on how clean The wound appears. 01/18/2019 on evaluation today patient actually appears to be doing well with regard to his wound on the left lower extremity. He is not made a lot of progress compared to where we were previous but nonetheless does seem to be doing okay at this time which is good news. There is no signs of active infection which is also good news. My only concern currently is I do wish we can get him into utilizing the collagen dressing his insurance would not pay for the supplies that we ordered although it appears that he may be able to order this through his supply company that he typically utilizes. This is [employer]. Nonetheless he did try to order it during the office visit today and it appears this did go through. We will see if he can get that it is a different brand but nonetheless he has collagen and I do think will be beneficial. 02/01/2019 on evaluation today patient  actually appears to be doing a little worse today in regard to the overall size of his wounds. Fortunately there is no signs of active infection at this time. That is visually. Nonetheless when this is happened before it was due to infection. For that reason were somewhat concerned about that this time as well. 02/08/2019 on evaluation today patient unfortunately appears to be doing slightly worse with regard to his wound upon evaluation today. Is measuring a little deeper and a little larger unfortunately. I am not really sure exactly what is causing this to enlarge he actually did see his dermatologist she is going to see about initiating Humira for him. Subsequently she also did do steroid injections into the wound itself in the periphery. Nonetheless still nonetheless he seems to be getting a little bit larger he is gone back to just using the steroid cream topically which I think is appropriate. I would say hold off on  the collagen for the time being is definitely a good thing to do. Based on the culture results which we finally did get the final result back regarding it shows staph as the bacteria noted again that can be a normal skin bacteria based on the fact however he is having increased drainage and worsening of the wound measurement wise I would go ahead and place him on an antibiotic today I do believe for this. 02/15/2019 on evaluation today patient actually appears to be doing somewhat better in regard to his ulcer. There is no signs of worsening at this time I did review his culture results which showed evidence of Staphylococcus aureus but not MRSA. Again this could just be more related to the normal skin bacteria although he states the drainage has slowed down quite a bit he may have had a mild infection not just colonization. And was much smaller and then since around10/04/2019 on evaluation today patient appears to be doing unfortunately worse as far as the size of the wound. I  really feel like that this is steadily getting larger again it had been doing excellent right at the beginning of September we have seen a steady increase in the area of the wound it is almost 2-1/2 times the size it was on September 1. Obviously this is a bad trend this is not wanting to see. For that reason we went back to using just the topical triamcinolone cream which does seem to help with inflammation. I checked him for bacteria by way of culture and nothing showed positive there. I am considering giving him a short course of a tapering steroid Dosepak today to see if that is can be beneficial for him. The patient is in agreement with giving that a try. 03/08/2019 on evaluation today patient appears to be doing very well in comparison to last evaluation with regard to his lower extremity ulcer. This is showing signs of less inflammation and actually measuring slightly smaller compared to last time every other week over the past month and a half he has been measuring larger larger larger. Nonetheless I do believe that the issue has been inflammation the prednisone does seem to have been beneficial for him which is good news. No fevers, chills, nausea, vomiting, or diarrhea. DELANEY, SCHNICK ([id number]) Patient History Information obtained from Patient. Family History Diabetes - Mother,Maternal Grandparents, Heart Disease - Mother,Maternal Grandparents, Hereditary Spherocytosis - Siblings, Hypertension - Maternal Grandparents, Stroke - Siblings, No family history of Cancer, Kidney Disease, Lung Disease, Seizures, Thyroid Problems, Tuberculosis. Social History Current some day smoker, Marital Status - Married, Alcohol Use - Rarely, Drug Use - No History, Caffeine Use - Moderate. Medical History Eyes Denies history of Cataracts, Glaucoma, Optic Neuritis Ear/Nose/Mouth/Throat Denies history of Chronic sinus problems/congestion, Middle ear problems Hematologic/Lymphatic Denies history of  Anemia, Hemophilia, Human Immunodeficiency Virus, Lymphedema, Sickle Cell Disease Respiratory Patient has history of Sleep Apnea - cpap Denies history of Aspiration, Asthma, Chronic Obstructive Pulmonary Disease (COPD), Pneumothorax Cardiovascular Patient has history of Hypertension Denies history of Angina, Arrhythmia, Congestive Heart Failure, Coronary Artery Disease, Deep Vein Thrombosis, Hypotension, Myocardial Infarction, Peripheral Arterial Disease, Peripheral Venous Disease, Phlebitis, Vasculitis Gastrointestinal Patient has history of Colitis Endocrine Denies history of Type I Diabetes, Type II Diabetes Genitourinary Denies history of End Stage Renal Disease Immunological Denies history of Lupus Erythematosus, Raynaud s, Scleroderma Integumentary (Skin) Denies history of History of Burn, History of pressure wounds Musculoskeletal Denies history of Gout, Rheumatoid Arthritis, Osteoarthritis, Osteomyelitis Neurologic Denies history of  Dementia, Neuropathy, Quadriplegia, Paraplegia, Seizure Disorder Oncologic Denies history of Received Chemotherapy, Received Radiation Psychiatric Denies history of Anorexia/bulimia, Confinement Anxiety Review of Systems (ROS) Constitutional Symptoms (General Health) Denies complaints or symptoms of Fatigue, Fever, Chills, Marked Weight Change. Respiratory Denies complaints or symptoms of Chronic or frequent coughs, Shortness of Breath. Cardiovascular Denies complaints or symptoms of Chest pain, LE edema. Psychiatric Denies complaints or symptoms of Anxiety, Claustrophobia. NACHMAN, SUNDT ([id number]) Objective Constitutional Well-nourished and well-hydrated in no acute distress. Vitals Time Taken: 8:56 AM, Height: 71 in, Weight: 338 lbs, BMI: 47.1, Temperature: 98.4 F, Pulse: 70 bpm, Respiratory Rate: 20 breaths/min, Blood Pressure: 138/74 mmHg. Respiratory normal breathing without difficulty. clear to auscultation  bilaterally. Cardiovascular regular rate and rhythm with normal S1, S2. Psychiatric this patient is able to make decisions and demonstrates good insight into disease process. Alert and Oriented x 3. pleasant and cooperative. General Notes: Upon inspection today patient's wound bed actually showed signs of good granulation at this time. Fortunately there is no signs of active infection which is good news. No fevers, chills, nausea, vomiting, or diarrhea. The wound bed appears still to show muscle and the base of the wound although there is much less inflammation than previously noted at last evaluation. I do believe the prednisone has helped him quite a bit. Integumentary (Hair, Skin) Wound #1 status is Open. Original cause of wound was Gradually Appeared. The wound is located on the Left,Lateral Lower Leg. The wound measures 3.9cm length x 3.2cm width x 0.4cm depth; 9.802cm^2 area and 3.921cm^3 volume. There is Fat Layer (Subcutaneous Tissue) Exposed exposed. There is no tunneling or undermining noted. There is a medium amount of serous drainage noted. The wound margin is distinct with the outline attached to the wound base. There is medium (34-66%) red granulation within the wound bed. There is a medium (34-66%) amount of necrotic tissue within the wound bed including Adherent Slough. 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 ESHAWN, COOR ([id number]) Anesthetic (add to Medication List): Wound #1 Left,Lateral Lower Leg: Topical Lidocaine 4% cream applied to wound bed prior to debridement (In Clinic Only). Primary Wound Dressing: Wound #1 Left,Lateral Lower Leg: Other: - TCA to wound bed only Secondary Dressing: Wound #1 Left,Lateral Lower Leg: Boardered Foam Dressing Dressing  Change Frequency: Wound #1 Left,Lateral Lower Leg: Change dressing every other day. Follow-up Appointments: Wound #1 Left,Lateral Lower Leg: Return Appointment in 2 weeks. Edema Control: Wound #1 Left,Lateral Lower Leg: Elevate legs to the level of the heart and pump ankles as often as possible 1. My suggestion currently is good to be that we go ahead and initiate treatment with continuation of the triamcinolone cream. I advised the patient he does not have to put this on very thickly just a very thin film over the wound and then covering this with his border foam dressing. This will provide less moisture added to the wound bed which hopefully should help as well as well as far as overall treatment is concerned. 2. I am also going to suggest that he change this every other day which I think is still appropriate. 3. I do want him still to elevate his leg is much as possible wearing a compression stocking would also be beneficial in my opinion. 4. His dermatologist has recommended the use of Humira to help and avoid having to go back  on prednisone long-term. With that being said as far as we are aware this has not been approved of as of yet. He sees his dermatologist on November 11 he called and got an appointment with her. We will see patient back for reevaluation in 2 weeks here in the clinic. If anything worsens or changes patient will contact our office for additional recommendations. Electronic Signature(s) Signed: 03/08/2019 9:15:03 AM By: Worthy Keeler PA-C Entered By: Worthy Keeler on 03/08/2019 09:15:03 DELSIN, COPEN ([id number]) -------------------------------------------------------------------------------- ROS/PFSH Details Patient Name: [name] Date of Service: 03/08/2019 8:45 AM Medical Record Number: [id number] Patient Account Number: [id number] Date of Birth/Sex: 1980-07-12 (38 y.o. M) Treating RN: Harold Barban Primary Care Provider: [name] Other  Clinician: Referring Provider: [name] Treating Provider/Extender: Melburn Hake, Martina Brodbeck Weeks in Treatment: 117 Information Obtained From Patient Constitutional Symptoms (General Health) Complaints and Symptoms: Negative for: Fatigue; Fever; Chills; Marked Weight Change Respiratory Complaints and Symptoms: Negative for: Chronic or frequent coughs; Shortness of Breath Medical History: Positive for: Sleep Apnea - cpap Negative for: Aspiration; Asthma; Chronic Obstructive Pulmonary Disease (COPD); Pneumothorax Cardiovascular Complaints and Symptoms: Negative for: Chest pain; LE edema Medical History: Positive for: Hypertension Negative for: Angina; Arrhythmia; Congestive Heart Failure; Coronary Artery Disease; Deep Vein Thrombosis; Hypotension; Myocardial Infarction; Peripheral Arterial Disease; Peripheral Venous Disease; Phlebitis; Vasculitis Psychiatric Complaints and Symptoms: Negative for: Anxiety; Claustrophobia Medical History: Negative for: Anorexia/bulimia; Confinement Anxiety Eyes Medical History: Negative for: Cataracts; Glaucoma; Optic Neuritis Ear/Nose/Mouth/Throat Medical History: Negative for: Chronic sinus problems/congestion; Middle ear problems Hematologic/Lymphatic Medical History: Negative for: Anemia; Hemophilia; Human Immunodeficiency Virus; Lymphedema; Sickle Cell Disease Bhatnagar, Juell ([id number]) Gastrointestinal Medical History: Positive for: Colitis Endocrine Medical History: Negative for: Type I Diabetes; Type II Diabetes Genitourinary Medical History: Negative for: End Stage Renal Disease Immunological Medical History: Negative for: Lupus Erythematosus; Raynaudos; Scleroderma Integumentary (Skin) Medical History: Negative for: History of Burn; History of pressure wounds Musculoskeletal Medical History: Negative for: Gout; Rheumatoid Arthritis; Osteoarthritis; Osteomyelitis Neurologic Medical History: Negative for: Dementia;  Neuropathy; Quadriplegia; Paraplegia; Seizure Disorder Oncologic Medical History: Negative for: Received Chemotherapy; Received Radiation Immunizations Pneumococcal Vaccine: Received Pneumococcal Vaccination: No Implantable Devices No devices added Family and Social History Cancer: No; Diabetes: Yes - Mother,Maternal Grandparents; Heart Disease: Yes - Mother,Maternal Grandparents; Hereditary Spherocytosis: Yes - Siblings; Hypertension: Yes - Maternal Grandparents; Kidney Disease: No; Lung Disease: No; Seizures: No; Stroke: Yes - Siblings; Thyroid Problems: No; Tuberculosis: No; Current some day smoker; Marital Status - Married; Alcohol Use: Rarely; Drug Use: No History; Caffeine Use: Moderate; Financial Concerns: No; Food, Clothing or Shelter Needs: No; Support System Lacking: No; Transportation Concerns: No Physician Affirmation I have reviewed and agree with the above information. Electronic Signature(s) Signed: 03/08/2019 4:50:44 PM By: Kathreen Cosier ([id number]) Signed: 03/08/2019 6:08:25 PM By: Worthy Keeler PA-C Entered By: Worthy Keeler on 03/08/2019 09:13:06 GREGORI, ABRIL ([id number]) -------------------------------------------------------------------------------- SuperBill Details Patient Name: [name] Date of Service: 03/08/2019 Medical Record Number: [id number] Patient Account Number: [id number] Date of Birth/Sex: 24-Jun-1980 (39 y.o. M) Treating RN: Harold Barban Primary Care Provider: [name] Other Clinician: Referring Provider: [name] Treating Provider/Extender: Melburn Hake, Mccoy Testa Weeks in Treatment: 117 Diagnosis Coding ICD-10 Codes Code Description [phone] Non-pressure chronic ulcer of left calf with fat layer exposed L88 Pyoderma gangrenosum I87.2 Venous insufficiency (chronic) (peripheral) Facility Procedures CPT4 Code: [id number] Description: 99213 - WOUND CARE VISIT-LEV 3 EST PT Modifier: Quantity: 1 Physician  Procedures CPT4 Code: [id number] Description: 31517 - WC PHYS LEVEL 4 - EST PT  ICD-10 Diagnosis Description L97.222 Non-pressure chronic ulcer of left calf with fat layer expo L88 Pyoderma gangrenosum I87.2 Venous insufficiency (chronic) (peripheral) Modifier: sed Quantity: 1 Electronic Signature(s) Signed: 03/08/2019 9:15:19 AM By: Worthy Keeler PA-C Entered By: Worthy Keeler on 03/08/2019 09:15:19

## 2019-03-09 NOTE — Progress Notes (Signed)
PRATT, BRESS ([id number]) Visit Report for 03/08/2019 Arrival Information Details Patient Name: [name] Date of Service: 03/08/2019 8:45 AM Medical Record Number: [id number] Patient Account Number: [id number] Date of Birth/Sex: Jan 15, 1981 (38 y.o. M) Treating RN: Montey Hora Primary Care Ehab Humber: [name] Other Clinician: Referring Szymon Foiles: [name] Treating Sharene Krikorian/Extender: Melburn Hake, [name]: 77 Visit Information History Since Last Visit Added or deleted any medications: No Patient Arrived: Ambulatory Any new allergies or adverse reactions: No Arrival Time: 08:53 Had a fall or experienced change in No Accompanied By: self activities of daily living that may affect Transfer Assistance: None risk of falls: Patient Identification Verified: Yes Signs or symptoms of abuse/neglect since last visito No Secondary Verification Process Completed: Yes Hospitalized since last visit: No Patient Requires Transmission-Based No Implantable device outside of the clinic excluding No Precautions: cellular tissue based products placed in the center Patient Has Alerts: No since last visit: Has Dressing in Place as Prescribed: Yes Has Compression in Place as Prescribed: Yes Pain Present Now: No Electronic Signature(s) Signed: 03/08/2019 5:06:26 PM By: Montey Hora Entered By: Montey Hora on 03/08/2019 08:54:44 [name] ([id number]) -------------------------------------------------------------------------------- Clinic Level of Care Assessment Details Patient Name: [name] Date of Service: 03/08/2019 8:45 AM Medical Record Number: [id number] Patient Account Number: [id number] Date of Birth/Sex: [birth year] (38 y.o. M) Treating RN: Harold Barban Primary Care Lynnae Ludemann: [name] Other Clinician: Referring Nalayah Hitt: [name] Treating Analuisa Tudor/Extender: Melburn Hake, [name]: 117 Clinic Level of Care  Assessment Items TOOL 4 Quantity Score []  - Use when only an EandM is performed on FOLLOW-UP visit 0 ASSESSMENTS - Nursing Assessment / Reassessment X - Reassessment of Co-morbidities (includes updates in patient status) 1 10 X- 1 5 Reassessment of Adherence to Treatment Plan ASSESSMENTS - Wound and Skin Assessment / Reassessment X - Simple Wound Assessment / Reassessment - one wound 1 5 []  - 0 Complex Wound Assessment / Reassessment - multiple wounds []  - 0 Dermatologic / Skin Assessment (not related to wound area) ASSESSMENTS - Focused Assessment []  - Circumferential Edema Measurements - multi extremities 0 []  - 0 Nutritional Assessment / Counseling / Intervention []  - 0 Lower Extremity Assessment (monofilament, tuning fork, pulses) []  - 0 Peripheral Arterial Disease Assessment (using hand held doppler) ASSESSMENTS - Ostomy and/or Continence Assessment and Care []  - Incontinence Assessment and Management 0 []  - 0 Ostomy Care Assessment and Management (repouching, etc.) PROCESS - Coordination of Care X - Simple Patient / Family Education for ongoing care 1 15 []  - 0 Complex (extensive) Patient / Family Education for ongoing care []  - 0 Staff obtains Programmer, systems, Records, Test Results / Process Orders []  - 0 Staff telephones HHA, Nursing Homes / Clarify orders / etc []  - 0 Routine Transfer to another Facility (non-emergent condition) []  - 0 Routine Hospital Admission (non-emergent condition) []  - 0 New Admissions / Biomedical engineer / Ordering NPWT, Apligraf, etc. []  - 0 Emergency Hospital Admission (emergent condition) X- 1 10 Simple Discharge Coordination Schmelzer, Yaphet ([id number]) []  - 0 Complex (extensive) Discharge Coordination PROCESS - Special Needs []  - Pediatric / Minor Patient Management 0 []  - 0 Isolation Patient Management []  - 0 Hearing / Language / Visual special needs []  - 0 Assessment of Community assistance (transportation, D/C planning,  etc.) []  - 0 Additional assistance / Altered mentation []  - 0 Support Surface(s) Assessment (bed, cushion, seat, etc.) INTERVENTIONS - Wound Cleansing / Measurement X - Simple Wound Cleansing - one wound 1 5 []  - 0  Complex Wound Cleansing - multiple wounds X- 1 5 Wound Imaging (photographs - any number of wounds) []  - 0 Wound Tracing (instead of photographs) X- 1 5 Simple Wound Measurement - one wound []  - 0 Complex Wound Measurement - multiple wounds INTERVENTIONS - Wound Dressings X - Small Wound Dressing one or multiple wounds 1 10 []  - 0 Medium Wound Dressing one or multiple wounds []  - 0 Large Wound Dressing one or multiple wounds X- 1 5 Application of Medications - topical []  - 0 Application of Medications - injection INTERVENTIONS - Miscellaneous []  - External ear exam 0 []  - 0 Specimen Collection (cultures, biopsies, blood, body fluids, etc.) []  - 0 Specimen(s) / Culture(s) sent or taken to Lab for analysis []  - 0 Patient Transfer (multiple staff / Civil Service fast streamer / Similar devices) []  - 0 Simple Staple / Suture removal (25 or less) []  - 0 Complex Staple / Suture removal (26 or more) []  - 0 Hypo / Hyperglycemic Management (close monitor of Blood Glucose) []  - 0 Ankle / Brachial Index (ABI) - do not check if billed separately X- 1 5 Vital Signs Heckendorn, Rashaun ([id number]) Has the patient been seen at the hospital within the last three years: Yes Total Score: 80 Level Of Care: New/Established - Level 3 Electronic Signature(s) Signed: 03/08/2019 4:50:44 PM By: Harold Barban Entered By: Harold Barban on 03/08/2019 09:05:10 [name] ([id number]) -------------------------------------------------------------------------------- Encounter Discharge Information Details Patient Name: [name] Date of Service: 03/08/2019 8:45 AM Medical Record Number: [id number] Patient Account Number: [id number] Date of Birth/Sex: 1980-07-08 (38 y.o. M) Treating RN: Harold Barban Primary Care Sigifredo Pignato: [name] Other Clinician: Referring Arland Usery: [name] Treating Davyn Morandi/Extender: Melburn Hake, [name]: 117 Encounter Discharge Information Items Discharge Condition: Stable Ambulatory Status: Ambulatory Discharge Destination: Home Transportation: Private Auto Accompanied By: self Schedule Follow-up Appointment: Yes Clinical Summary of Care: Patient Declined Electronic Signature(s) Signed: 03/08/2019 4:50:44 PM By: Harold Barban Entered By: Harold Barban on 03/08/2019 09:11:20 [name] ([id number]) -------------------------------------------------------------------------------- Lower Extremity Assessment Details Patient Name: [name] Date of Service: 03/08/2019 8:45 AM Medical Record Number: [id number] Patient Account Number: [id number] Date of Birth/Sex: 1980/11/03 (38 y.o. M) Treating RN: Montey Hora Primary Care Coburn Knaus: [name] Other Clinician: Referring Mertis Mosher: [name] Treating Elenna Spratling/Extender: STONE III, [name]: 117 Edema Assessment Assessed: [Left: No] [Right: No] Edema: [Left: Ye] [Right: s] Vascular Assessment Pulses: Dorsalis Pedis Palpable: [Right:Yes] Electronic Signature(s) Signed: 03/08/2019 5:06:26 PM By: Montey Hora Entered By: Montey Hora on 03/08/2019 09:01:19 [name] ([id number]) -------------------------------------------------------------------------------- Multi Wound Chart Details Patient Name: [name] Date of Service: 03/08/2019 8:45 AM Medical Record Number: [id number] Patient Account Number: [id number] Date of Birth/Sex: [birth year] (38 y.o. M) Treating RN: Harold Barban Primary Care Avary Pitsenbarger: [name] Other Clinician: Referring Shamond Skelton: [name] Treating Bhavana Kady/Extender: Melburn Hake, [name]: 117 Vital Signs Height(in): 71 Pulse(bpm): 70 Weight(lbs): 338 Blood  Pressure(mmHg): 138/74 Body Mass Index(BMI): 47 Temperature(F): 98.4 Respiratory Rate 20 (breaths/min): Photos: [N/A:N/A] Wound Location: Left Lower Leg - Lateral N/A N/A Wounding Event: Gradually Appeared N/A N/A Primary Etiology: Pyoderma N/A N/A Comorbid History: Sleep Apnea, Hypertension, N/A N/A Colitis Date Acquired: 11/18/2015 N/A N/A Weeks of Treatment: 117 N/A N/A Wound Status: Open N/A N/A Measurements L x W x D 3.9x3.2x0.4 N/A N/A (cm) Area (cm) : 9.802 N/A N/A Volume (cm) : 3.921 N/A N/A % Reduction in Area: -99.70% N/A N/A % Reduction in Volume: 0.20% N/A N/A Classification: Full Thickness With Exposed N/A N/A  Support Structures Exudate Amount: Medium N/A N/A Exudate Type: Serous N/A N/A Exudate Color: amber N/A N/A Wound Margin: Distinct, outline attached N/A N/A Granulation Amount: Medium (34-66%) N/A N/A Granulation Quality: Red N/A N/A Necrotic Amount: Medium (34-66%) N/A N/A Exposed Structures: Fat Layer (Subcutaneous N/A N/A Tissue) Exposed: Yes Fascia: No Tendon: No Muscle: No Revelo, Linell ([id number]) Joint: No Bone: No Epithelialization: None N/A N/A Treatment Notes Electronic Signature(s) Signed: 03/08/2019 4:50:44 PM By: Harold Barban Entered By: Harold Barban on 03/08/2019 09:04:26 [name] ([id number]) -------------------------------------------------------------------------------- Multi-Disciplinary Care Plan Details Patient Name: [name] Date of Service: 03/08/2019 8:45 AM Medical Record Number: [id number] Patient Account Number: [id number] Date of Birth/Sex: May 13, 1981 (38 y.o. M) Treating RN: Harold Barban Primary Care Khaliyah Northrop: [name] Other Clinician: Referring Yanelly Cantrelle: [name] Treating Anatalia Kronk/Extender: Melburn Hake, [name]: 117 Active Inactive Orientation to the Wound Care Program Nursing Diagnoses: Knowledge deficit related to the wound healing center  program Goals: Patient/caregiver will verbalize understanding of the [facility] Program Date Initiated: 12/11/2016 Target Resolution Date: 03/13/2017 Goal Status: Active Interventions: Provide education on orientation to the wound center Notes: Venous Leg Ulcer Nursing Diagnoses: Knowledge deficit related to disease process and management Potential for venous Insuffiency (use before diagnosis confirmed) Goals: Non-invasive venous studies are completed as ordered Date Initiated: 12/11/2016 Target Resolution Date: 03/13/2017 Goal Status: Active Patient will maintain optimal edema control Date Initiated: 12/11/2016 Target Resolution Date: 03/13/2017 Goal Status: Active Patient/caregiver will verbalize understanding of disease process and disease management Date Initiated: 12/11/2016 Target Resolution Date: 03/13/2017 Goal Status: Active Verify adequate tissue perfusion prior to therapeutic compression application Date Initiated: 12/11/2016 Target Resolution Date: 03/13/2017 Goal Status: Active Interventions: Assess peripheral edema status every visit. Compression as ordered Provide education on venous insufficiency Treatment Activities: DAREON, NUNZIATO ([id number]) Therapeutic compression applied : 12/11/2016 Notes: Wound/Skin Impairment Nursing Diagnoses: Impaired tissue integrity Knowledge deficit related to ulceration/compromised skin integrity Goals: Patient/caregiver will verbalize understanding of skin care regimen Date Initiated: 12/11/2016 Target Resolution Date: 03/13/2017 Goal Status: Active Ulcer/skin breakdown will have a volume reduction of 30% by week 4 Date Initiated: 12/11/2016 Target Resolution Date: 03/13/2017 Goal Status: Active Ulcer/skin breakdown will have a volume reduction of 50% by week 8 Date Initiated: 12/11/2016 Target Resolution Date: 03/13/2017 Goal Status: Active Ulcer/skin breakdown will have a volume reduction of 80% by week 12 Date  Initiated: 12/11/2016 Target Resolution Date: 03/13/2017 Goal Status: Active Ulcer/skin breakdown will heal within 14 weeks Date Initiated: 12/11/2016 Target Resolution Date: 03/13/2017 Goal Status: Active Interventions: Assess patient/caregiver ability to obtain necessary supplies Assess patient/caregiver ability to perform ulcer/skin care regimen upon admission and as needed Assess ulceration(s) every visit Provide education on ulcer and skin care Treatment Activities: Skin care regimen initiated : 12/11/2016 Topical wound management initiated : 12/11/2016 Notes: Electronic Signature(s) Signed: 03/08/2019 4:50:44 PM By: Harold Barban Entered By: Harold Barban on 03/08/2019 Taylor, [name] ([id number]) -------------------------------------------------------------------------------- Pain Assessment Details Patient Name: [name] Date of Service: 03/08/2019 8:45 AM Medical Record Number: [id number] Patient Account Number: [id number] Date of Birth/Sex: Sep 27, 1980 (38 y.o. M) Treating RN: Montey Hora Primary Care Jonah Nestle: [name] Other Clinician: Referring Dera Vanaken: [name] Treating Deanna Boehlke/Extender: Melburn Hake, [name]: 117 Active Problems Location of Pain Severity and Description of Pain Patient Has Paino Yes Site Locations Pain Location: Pain in Ulcers With Dressing Change: Yes Duration of the Pain. Constant / Intermittento Intermittent Rate the pain. Current Pain Level: 2 Character of Pain Describe the Pain: Aching Pain Management and  Medication Current Pain Management: Electronic Signature(s) Signed: 03/08/2019 5:06:26 PM By: Montey Hora Entered By: Montey Hora on 03/08/2019 08:55:01 [name] ([id number]) -------------------------------------------------------------------------------- Patient/Caregiver Education Details Patient Name: [name] Date of Service: 03/08/2019 8:45 AM Medical Record Number:  [id number] Patient Account Number: [id number] Date of Birth/Gender: 01-13-1981 (38 y.o. M) Treating RN: Harold Barban Primary Care Physician: [name] Other Clinician: Referring Physician: [name] Treating Physician/Extender: Sharalyn Ink in Treatment: 117 Education Assessment Education Provided To: Patient Education Topics Provided Wound/Skin Impairment: Handouts: Caring for Your Ulcer Methods: Demonstration, Explain/Verbal Responses: State content correctly Electronic Signature(s) Signed: 03/08/2019 4:50:44 PM By: Harold Barban Entered By: Harold Barban on 03/08/2019 09:10:38 [name] ([id number]) -------------------------------------------------------------------------------- Wound Assessment Details Patient Name: [name] Date of Service: 03/08/2019 8:45 AM Medical Record Number: [id number] Patient Account Number: [id number] Date of Birth/Sex: 01-30-1981 (38 y.o. M) Treating RN: Montey Hora Primary Care Chief Walkup: [name] Other Clinician: Referring Dashanique Brownstein: [name] Treating Illias Pantano/Extender: Melburn Hake, [name]: 117 Wound Status Wound Number: 1 Primary Etiology: Pyoderma Wound Location: Left Lower Leg - Lateral Wound Status: Open Wounding Event: Gradually Appeared Comorbid History: Sleep Apnea, Hypertension, Colitis Date Acquired: 11/18/2015 Weeks Of Treatment: 117 Clustered Wound: No Photos Wound Measurements Length: (cm) 3.9 Width: (cm) 3.2 Depth: (cm) 0.4 Area: (cm) 9.802 Volume: (cm) 3.921 % Reduction in Area: -99.7% % Reduction in Volume: 0.2% Epithelialization: None Tunneling: No Undermining: No Wound Description Full Thickness With Exposed Support Foul Odor Classification: Structures Slough/Fib Wound Margin: Distinct, outline attached Exudate Medium Amount: Exudate Type: Serous Exudate Color: amber After Cleansing: No rino Yes Wound Bed Granulation Amount: Medium  (34-66%) Exposed Structure Granulation Quality: Red Fascia Exposed: No Necrotic Amount: Medium (34-66%) Fat Layer (Subcutaneous Tissue) Exposed: Yes Necrotic Quality: Adherent Slough Tendon Exposed: No Muscle Exposed: No Joint Exposed: No Bone Exposed: No Zieger, Blayn ([id number]) Treatment Notes Wound #1 (Left, Lateral Lower Leg) Notes TCA to wound bed only, BFD Electronic Signature(s) Signed: 03/08/2019 5:06:26 PM By: Montey Hora Entered By: Montey Hora on 03/08/2019 09:00:56 [name] ([id number]) -------------------------------------------------------------------------------- [name] Details Patient Name: [name] Date of Service: 03/08/2019 8:45 AM Medical Record Number: [id number] Patient Account Number: [id number] Date of Birth/Sex: 1980-12-24 (38 y.o. M) Treating RN: Montey Hora Primary Care Keyontae Huckeby: [name] Other Clinician: Referring Mililani Murthy: [name] Treating Corinn Stoltzfus/Extender: Melburn Hake, [name]: 117 Vital Signs Time Taken: 08:56 Temperature (F): 98.4 Height (in): 71 Pulse (bpm): 70 Weight (lbs): 338 Respiratory Rate (breaths/min): 20 Body Mass Index (BMI): 47.1 Blood Pressure (mmHg): 138/74 Reference Range: 80 - 120 mg / dl Electronic Signature(s) Signed: 03/08/2019 5:06:26 PM By: Montey Hora Entered By: Montey Hora on 03/08/2019 08:56:39

## 2019-03-22 ENCOUNTER — Other Ambulatory Visit: Payer: Self-pay

## 2019-03-22 ENCOUNTER — Encounter: Payer: 59 | Attending: Physician Assistant | Admitting: Physician Assistant

## 2019-03-22 DIAGNOSIS — G4730 Sleep apnea, unspecified: Secondary | ICD-10-CM | POA: Insufficient documentation

## 2019-03-22 DIAGNOSIS — F17200 Nicotine dependence, unspecified, uncomplicated: Secondary | ICD-10-CM | POA: Insufficient documentation

## 2019-03-22 DIAGNOSIS — L97222 Non-pressure chronic ulcer of left calf with fat layer exposed: Secondary | ICD-10-CM | POA: Diagnosis present

## 2019-03-22 DIAGNOSIS — I10 Essential (primary) hypertension: Secondary | ICD-10-CM | POA: Insufficient documentation

## 2019-03-22 DIAGNOSIS — L97213 Non-pressure chronic ulcer of right calf with necrosis of muscle: Secondary | ICD-10-CM | POA: Diagnosis present

## 2019-03-22 NOTE — Progress Notes (Signed)
STEDMAN, [place] ([id number]) Visit Report for 03/22/2019 Arrival Information Details Patient Name: [name], [name] Date of Service: 03/22/2019 8:00 AM Medical Record Number: [id number] Patient Account Number: [id number] Date of Birth/Sex: 02/14/1981 (38 y.o. M) Treating RN: [name] Primary Care [name]: [name] Other Clinician: Referring [name]: [name] Treating [name]/Extender: [name], [name] [name]: 35 Visit Information History Since Last Visit Added or deleted any medications: No Patient Arrived: Ambulatory Any new allergies or adverse reactions: No Arrival Time: 08:07 Had a fall or experienced change in No Accompanied By: self activities of daily living that may affect Transfer Assistance: None risk of falls: Patient Identification Verified: Yes Signs or symptoms of abuse/neglect since last visito No Secondary Verification Process Completed: Yes Hospitalized since last visit: No Patient Requires Transmission-Based No Implantable device outside of the clinic excluding No Precautions: cellular tissue based products placed in the center Patient Has Alerts: No since last visit: Has Dressing in Place as Prescribed: Yes Pain Present Now: No Electronic Signature(s) Signed: 03/22/2019 3:46:08 PM By: [name] Entered By: [name] on 03/22/2019 08:08:29 [name] ([id number]) -------------------------------------------------------------------------------- Clinic Level of Care Assessment Details Patient Name: [name] Date of Service: 03/22/2019 8:00 AM Medical Record Number: [id number] Patient Account Number: [id number] Date of Birth/Sex: [birth year] (38 y.o. M) Treating RN: [name] Primary Care Genavive Kubicki: [name] Other Clinician: Referring Tali Coster: [name] Treating [name]/Extender: [name], [name] [name]: 119 Clinic Level of Care Assessment Items TOOL 4 Quantity Score []  - Use when  only an EandM is performed on FOLLOW-UP visit 0 ASSESSMENTS - Nursing Assessment / Reassessment X - Reassessment of Co-morbidities (includes updates in patient status) 1 10 X- 1 5 Reassessment of Adherence to [name] Plan ASSESSMENTS - Wound and Skin Assessment / Reassessment X - Simple Wound Assessment / Reassessment - one wound 1 5 []  - 0 Complex Wound Assessment / Reassessment - multiple wounds []  - 0 Dermatologic / Skin Assessment (not related to wound area) ASSESSMENTS - Focused Assessment []  - Circumferential Edema Measurements - multi extremities 0 []  - 0 Nutritional Assessment / Counseling / Intervention []  - 0 Lower Extremity Assessment (monofilament, tuning fork, pulses) []  - 0 Peripheral Arterial Disease Assessment (using hand held doppler) ASSESSMENTS - Ostomy and/or Continence Assessment and Care []  - Incontinence Assessment and Management 0 []  - 0 Ostomy Care Assessment and Management (repouching, etc.) PROCESS - Coordination of Care X - Simple Patient / Family Education for ongoing care 1 15 []  - 0 Complex (extensive) Patient / Family Education for ongoing care []  - 0 Staff obtains Programmer, systems, Records, Test Results / Process Orders []  - 0 Staff telephones HHA, Nursing Homes / Clarify orders / etc []  - 0 Routine Transfer to another Facility (non-emergent condition) []  - 0 Routine Hospital Admission (non-emergent condition) []  - 0 New Admissions / Biomedical engineer / Ordering NPWT, Apligraf, etc. []  - 0 Emergency Hospital Admission (emergent condition) X- 1 10 Simple Discharge Coordination [name], [name] ([id number]) []  - 0 Complex (extensive) Discharge Coordination PROCESS - Special Needs []  - Pediatric / Minor Patient Management 0 []  - 0 Isolation Patient Management []  - 0 Hearing / Language / Visual special needs []  - 0 Assessment of Community assistance (transportation, D/C planning, etc.) []  - 0 Additional assistance / Altered mentation []   - 0 Support Surface(s) Assessment (bed, cushion, seat, etc.) INTERVENTIONS - Wound Cleansing / Measurement X - Simple Wound Cleansing - one wound 1 5 []  - 0 Complex Wound Cleansing - multiple wounds X-  1 5 Wound Imaging (photographs - any number of wounds) []  - 0 Wound Tracing (instead of photographs) X- 1 5 Simple Wound Measurement - one wound []  - 0 Complex Wound Measurement - multiple wounds INTERVENTIONS - Wound Dressings X - Small Wound Dressing one or multiple wounds 1 10 []  - 0 Medium Wound Dressing one or multiple wounds []  - 0 Large Wound Dressing one or multiple wounds X- 1 5 Application of Medications - topical []  - 0 Application of Medications - injection INTERVENTIONS - Miscellaneous []  - External ear exam 0 []  - 0 Specimen Collection (cultures, biopsies, blood, body fluids, etc.) []  - 0 Specimen(s) / Culture(s) sent or taken to Lab for analysis []  - 0 Patient Transfer (multiple staff / Civil Service fast streamer / Similar devices) []  - 0 Simple Staple / Suture removal (25 or less) []  - 0 Complex Staple / Suture removal (26 or more) []  - 0 Hypo / Hyperglycemic Management (close monitor of Blood Glucose) []  - 0 Ankle / Brachial Index (ABI) - do not check if billed separately X- 1 5 Vital Signs [name], [name] ([id number]) Has the patient been seen at the hospital within the last three years: Yes Total Score: 80 Level Of Care: New/Established - Level 3 Electronic Signature(s) Signed: 03/22/2019 4:25:43 PM By: [name] Entered By: [name] on 03/22/2019 08:20:59 [name] ([id number]) -------------------------------------------------------------------------------- Encounter Discharge Information Details Patient Name: [name] Date of Service: 03/22/2019 8:00 AM Medical Record Number: [id number] Patient Account Number: [id number] Date of Birth/Sex: [birth year] (38 y.o. M) Treating RN: [name] Primary Care Jovonda Selner: [name] Other  Clinician: Referring [name] Kosek: [name] Treating Gwenith Tschida/Extender: [name], [name] [name]: 119 Encounter Discharge Information Items Discharge Condition: Stable Ambulatory Status: Ambulatory Discharge Destination: Home Transportation: Private Auto Accompanied By: self Schedule Follow-up Appointment: Yes Clinical Summary of Care: Electronic Signature(s) Signed: 03/22/2019 4:25:43 PM By: [name] Entered By: [name] on 03/22/2019 08:23:48 [name] ([id number]) -------------------------------------------------------------------------------- Lower Extremity Assessment Details Patient Name: [name] Date of Service: 03/22/2019 8:00 AM Medical Record Number: [id number] Patient Account Number: [id number] Date of Birth/Sex: 1981/02/07 (38 y.o. M) Treating RN: [name] Primary Care Tiffiny Worthy: [name] Other Clinician: Referring Cythia Bachtel: [name] Treating Duffy Dantonio/Extender: [name], [name] [name]: 119 Electronic Signature(s) Signed: 03/22/2019 3:46:08 PM By: [name] Entered By: [name] on 03/22/2019 08:15:02 [name] ([id number]) -------------------------------------------------------------------------------- Multi Wound Chart Details Patient Name: [name] Date of Service: 03/22/2019 8:00 AM Medical Record Number: [id number] Patient Account Number: [id number] Date of Birth/Sex: Mar 09, 1981 (38 y.o. M) Treating RN: [name] Primary Care Derrall Hicks: [name] Other Clinician: Referring Chistine Dematteo: [name] Treating Deshonda Cryderman/Extender: [name], [name] [name]: 119 Vital Signs Height(in): 71 Pulse(bpm): 92 Weight(lbs): 338 Blood Pressure(mmHg): 140/71 Body Mass Index(BMI): 47 Temperature(F): 98.8 Respiratory Rate 20 (breaths/min): Photos: [N/A:N/A] Wound Location: Left Lower Leg - Lateral N/A N/A Wounding Event: Gradually Appeared N/A N/A Primary Etiology:  Pyoderma N/A N/A Comorbid History: Sleep Apnea, Hypertension, N/A N/A Colitis Date Acquired: 11/18/2015 N/A N/A [name] of [name]: 119 N/A N/A Wound Status: Open N/A N/A Measurements L x W x D 4.2x3x0.5 N/A N/A (cm) Area (cm) : 9.896 N/A N/A Volume (cm) : 4.948 N/A N/A % Reduction in Area: -101.60% N/A N/A % Reduction in Volume: -26.00% N/A N/A Classification: Full Thickness With Exposed N/A N/A Support Structures Exudate Amount: Medium N/A N/A Exudate Type: Serous N/A N/A Exudate Color: amber N/A N/A Wound Margin: Distinct, outline attached N/A N/A Granulation Amount: Small (1-33%)  N/A N/A Granulation Quality: Red N/A N/A Necrotic Amount: Large (67-100%) N/A N/A Exposed Structures: Fat Layer (Subcutaneous N/A N/A Tissue) Exposed: Yes Fascia: No Tendon: No Muscle: No [name], [name] ([id number]) Joint: No Bone: No Epithelialization: None N/A N/A [name] Notes Electronic Signature(s) Signed: 03/22/2019 4:25:43 PM By: [name] Entered By: [name] on 03/22/2019 08:19:59 [name] ([id number]) -------------------------------------------------------------------------------- Multi-Disciplinary Care Plan Details Patient Name: [name] Date of Service: 03/22/2019 8:00 AM Medical Record Number: [id number] Patient Account Number: [id number] Date of Birth/Sex: 1981-05-05 (38 y.o. M) Treating RN: [name] Primary Care Geraldine Tesar: [name] Other Clinician: Referring Javonte Elenes: [name] Treating Wing Schoch/Extender: [name], [name] [name]: 29 Active Inactive Orientation to the Wound Care Program Nursing Diagnoses: Knowledge deficit related to the wound healing center program Goals: Patient/caregiver will verbalize understanding of the [place] Program Date Initiated: 12/11/2016 Target Resolution Date: 03/13/2017 Goal Status: Active Interventions: Provide education on orientation to the wound center Notes: Venous  Leg Ulcer Nursing Diagnoses: Knowledge deficit related to disease process and management Potential for venous Insuffiency (use before diagnosis confirmed) Goals: Non-invasive venous studies are completed as ordered Date Initiated: 12/11/2016 Target Resolution Date: 03/13/2017 Goal Status: Active Patient will maintain optimal edema control Date Initiated: 12/11/2016 Target Resolution Date: 03/13/2017 Goal Status: Active Patient/caregiver will verbalize understanding of disease process and disease management Date Initiated: 12/11/2016 Target Resolution Date: 03/13/2017 Goal Status: Active Verify adequate tissue perfusion prior to therapeutic compression application Date Initiated: 12/11/2016 Target Resolution Date: 03/13/2017 Goal Status: Active Interventions: Assess peripheral edema status every visit. Compression as ordered Provide education on venous insufficiency [name] Activities: [name], [name] ([id number]) Therapeutic compression applied : 12/11/2016 Notes: Wound/Skin Impairment Nursing Diagnoses: Impaired tissue integrity Knowledge deficit related to ulceration/compromised skin integrity Goals: Patient/caregiver will verbalize understanding of skin care regimen Date Initiated: 12/11/2016 Target Resolution Date: 03/13/2017 Goal Status: Active Ulcer/skin breakdown will have a volume reduction of 30% by week 4 Date Initiated: 12/11/2016 Target Resolution Date: 03/13/2017 Goal Status: Active Ulcer/skin breakdown will have a volume reduction of 50% by week 8 Date Initiated: 12/11/2016 Target Resolution Date: 03/13/2017 Goal Status: Active Ulcer/skin breakdown will have a volume reduction of 80% by week 12 Date Initiated: 12/11/2016 Target Resolution Date: 03/13/2017 Goal Status: Active Ulcer/skin breakdown will heal within 14 [name] Date Initiated: 12/11/2016 Target Resolution Date: 03/13/2017 Goal Status: Active Interventions: Assess patient/caregiver ability to obtain  necessary supplies Assess patient/caregiver ability to perform ulcer/skin care regimen upon admission and as needed Assess ulceration(s) every visit Provide education on ulcer and skin care [name] Activities: Skin care regimen initiated : 12/11/2016 Topical wound management initiated : 12/11/2016 Notes: Electronic Signature(s) Signed: 03/22/2019 4:25:43 PM By: [name] Entered By: [name] on 03/22/2019 08:19:25 [name] ([id number]) -------------------------------------------------------------------------------- Pain Assessment Details Patient Name: [name] Date of Service: 03/22/2019 8:00 AM Medical Record Number: [id number] Patient Account Number: [id number] Date of Birth/Sex: [birth year] (38 y.o. M) Treating RN: [name] Primary Care Tiaira Arambula: [name] Other Clinician: Referring Lashaun Krapf: [name] Treating Haja Crego/Extender: [name], [name] [name]: 119 Active Problems Location of Pain Severity and Description of Pain Patient Has Paino Yes Site Locations Pain Location: Pain in Ulcers With Dressing Change: Yes Duration of the Pain. Constant / Intermittento Intermittent Character of Pain Describe the Pain: Other: stings Pain Management and Medication Current Pain Management: Electronic Signature(s) Signed: 03/22/2019 3:46:08 PM By: [name] Entered By: [name] on 03/22/2019 08:08:49 [name] ([id number]) -------------------------------------------------------------------------------- Patient/Caregiver Education Details Patient Name: [name] Date of Service:  03/22/2019 8:00 AM Medical Record Number: [id number] Patient Account Number: [id number] Date of Birth/Gender: June 17, 1980 (38 y.o. M) Treating RN: [name] Primary Care Physician: [name] Other Clinician: Referring Physician: Alma [name] Treating Physician/Extender: Sharalyn Ink in [name]: 119 Education  Assessment Education Provided To: Patient Education Topics Provided Venous: Handouts: Controlling Swelling with Compression Stockings Methods: Demonstration, Explain/Verbal Responses: State content correctly Wound/Skin Impairment: Handouts: Caring for Your Ulcer Methods: Demonstration, Explain/Verbal Responses: State content correctly Electronic Signature(s) Signed: 03/22/2019 4:25:43 PM By: [name] Entered By: [name] on 03/22/2019 08:20:21 [name] ([id number]) -------------------------------------------------------------------------------- Wound Assessment Details Patient Name: [name] Date of Service: 03/22/2019 8:00 AM Medical Record Number: [id number] Patient Account Number: [id number] Date of Birth/Sex: 09/10/1980 (38 y.o. M) Treating RN: [name] Primary Care Gwenn Teodoro: [name] Other Clinician: Referring Kristia Jupiter: [name] Treating Ashvin Adelson/Extender: [name], [name] [name]: 119 Wound Status Wound Number: 1 Primary Etiology: Pyoderma Wound Location: Left Lower Leg - Lateral Wound Status: Open Wounding Event: Gradually Appeared Comorbid History: Sleep Apnea, Hypertension, Colitis Date Acquired: 11/18/2015 [name] Of [name]: 119 Clustered Wound: No Photos Wound Measurements Length: (cm) 4.2 Width: (cm) 3 Depth: (cm) 0.5 Area: (cm) 9.896 Volume: (cm) 4.948 % Reduction in Area: -101.6% % Reduction in Volume: -26% Epithelialization: None Tunneling: No Undermining: No Wound Description Full Thickness With Exposed Support Foul Odor A Classification: Structures Slough/Fibr Wound Margin: Distinct, outline attached Exudate Medium Amount: Exudate Type: Serous Exudate Color: amber fter Cleansing: No ino Yes Wound Bed Granulation Amount: Small (1-33%) Exposed Structure Granulation Quality: Red Fascia Exposed: No Necrotic Amount: Large (67-100%) Fat Layer (Subcutaneous Tissue) Exposed: Yes Necrotic  Quality: Adherent Slough Tendon Exposed: No Muscle Exposed: No Joint Exposed: No Bone Exposed: No [name], [name] ([id number]) [name] Notes Wound #1 (Left, Lateral Lower Leg) Notes TCA to wound bed only, BFD Electronic Signature(s) Signed: 03/22/2019 3:46:08 PM By: [name] Entered By: [name] on 03/22/2019 08:14:48 [name] ([id number]) -------------------------------------------------------------------------------- [place] Details Patient Name: [name] Date of Service: 03/22/2019 8:00 AM Medical Record Number: [id number] Patient Account Number: [id number] Date of Birth/Sex: Oct 26, 1980 (38 y.o. M) Treating RN: [name] Primary Care Jianna Drabik: [name] Other Clinician: Referring Naketa Daddario: [name] Treating Danniel Tones/Extender: [name], [name] [name]: 119 Vital Signs Time Taken: 08:08 Temperature (F): 98.8 Height (in): 71 Pulse (bpm): 92 Weight (lbs): 338 Respiratory Rate (breaths/min): 20 Body Mass Index (BMI): 47.1 Blood Pressure (mmHg): 140/71 Reference Range: 80 - 120 mg / dl Electronic Signature(s) Signed: 03/22/2019 3:46:08 PM By: [name] Entered By: [name] on 03/22/2019 08:11:44

## 2019-03-22 NOTE — Progress Notes (Addendum)
MCCLAIN, SHALL ([id number]) Visit Report for 03/22/2019 Chief Complaint Document Details Patient Name: [name], [name] Date of Service: 03/22/2019 8:00 AM Medical Record Number: [id number] Patient Account Number: [id number] Date of Birth/Sex: 1980-08-29 (38 y.o. 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 M) Treating RN: Harold Barban Primary Care Provider: [name] Other Clinician: Referring Provider: [name] Treating Provider/Extender: Melburn Hake, Carolin Quang Weeks in Treatment: 119 History of Present Illness HPI Description: 12/04/16; 38 year old man who comes into the clinic today for review of a wound on the posterior left calf. He tells me that is been there for about a year. He is not a diabetic he does smoke half a pack per day. He was seen in the ER on 11/20/16 felt to have cellulitis around the wound and was given clindamycin. An x-ray did not show osteomyelitis. The patient initially tells me that he has a milk allergy that sets off a pruritic itching rash on his lower legs which she scratches incessantly and he thinks that's what may have set up the wound. He has been using various topical antibiotics and ointments without any effect. He works in a [employer] and is on his feet all day. He does not have a  prior history of wounds however he does have the rash on both lower legs the right arm and the ventral aspect of his left arm. These are excoriations and clearly have had scratching however there are of macular looking areas on both legs including a substantial larger area on the right leg. This does not have an underlying open area. There is no blistering. The patient tells me that 2 years ago in [place] in response to the rash on his legs he saw a dermatologist who told him he had a condition which may be pyoderma gangrenosum although I may be putting words into his mouth. He seemed to recognize this. 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 He is not in any treatment for this. He's had no recent travel 12/11/16; the patient arrives today with his wound and roughly the same condition we've been using silver alginate this is a deep punched out wound with some surrounding erythema but no tenderness. Biopsy I did did not show confirmed pyoderma gangrenosum suggested nonspecific inflammation and vasculitis but does not provide an actual description of what was seen by the pathologist. I'm really not able to understand this We have also received information from the patient's dermatologist in [place] notes from April 2016. This was a doctor Agarwal- antal. 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 He was prescribed topical steroid high potency under occlusion which helped but at this point the patient did not have a deep punched out wound. 12/18/16; the patient's wound is larger in terms of surface area however this surface looks better and there is less depth. The surrounding erythema also is better. The patient states that the wrap we put on came off 2 days ago when he has been using  his compression stockings. He we are in the process of getting a dermatology consult. 12/26/16 on evaluation today patient's left lower extremity wound shows evidence  of infection with surrounding erythema noted. He has been tolerating the dressing changes but states that he has noted more discomfort. There is a larger area of erythema surrounding the wound. No fevers, chills, nausea, or vomiting noted at this time. With that being said the wound still does have slough covering the surface. He is not allergic to any medication that he is aware of at this point. In regard to his right lower extremity he had several regions that are erythematous and pruritic he wonders if there's anything we can do to help that. 01/02/17 I reviewed patient's wound culture which was obtained his visit last week. He was placed on doxycycline at that point. Unfortunately that does not appear to be an antibiotic that would likely help with the situation however the pseudomonas noted on culture is sensitive to Cipro. Also unfortunately patient's wound seems to have a large compared to last week's evaluation. Not severely so but there are definitely increased measurements in general. He is continuing to have discomfort as well he writes this to be a seven out of 10. In fact he would prefer me not to perform any debridement today due to the fact that he is having discomfort and considering he has an active infection on the little reluctant to do so anyway. No fevers, chills, nausea, or vomiting noted at this time. 01/08/17; patient seems dermatology on September 5. I suspect dermatology will want the slides from the biopsy I did sent to their pathologist. I'm not sure if there is a way we can expedite that. In any case the culture I did before I left on vacation 3 weeks ago showed Pseudomonas he was given 10 days of Cipro and per her description of her intake nurses is actually somewhat better this week although the wound is quite a bit bigger than I remember the last time I saw this. He still has 3 more days of Cipro [name], [name] ([id number]) 01/21/17; dermatology appointment tomorrow. He  has completed the ciprofloxacin for Pseudomonas. Surface of the wound looks better however he is had some deterioration in the lesions on his right leg. Meantime the left lateral leg wound we will continue with sample 01/29/17; patient had his dermatology appointment but I can't yet see that note. He is completed his antibiotics. The wound is more superficial but considerably larger in circumferential area than when he came in. This is in his left lateral calf. He also has swollen erythematous areas with superficial wounds on the right leg and small papular areas on both arms. There apparently areas in her his upper thighs and buttocks I did not look at those. Dermatology biopsied the right leg. Hopefully will have their input next week. 02/05/17; patient went back to see his dermatologist who told him that he had a "scratching problem" as well as staph. He is now on a 30 day course of doxycycline and I believe she gave him triamcinolone cream to the right leg areas to help with the itching [not exactly sure but probably triamcinolone]. She apparently looked at the left lateral leg wound although this was not rebiopsied and I think felt to be ultimately part of the same pathogenesis. He is using sample border foam and changing nevus himself. He now has a new open area on the right posterior leg which was his biopsy site I don't  have any of the dermatology notes 02/12/17; we put the patient in compression last week with SANTYL to the wound on the left leg and the biopsy. Edema is much better and the depth of the wound is now at level of skin. Area is still the same oBiopsy site on the right lateral leg we've also been using santyl with a border foam dressing and he is changing this himself. 02/19/17; Using silver alginate started last week to both the substantial left leg wound and the biopsy site on the right wound. He is tolerating compression well. Has a an appointment with his primary M.D. tomorrow  wondering about diuretics although I'm wondering if the edema problem is actually lymphedema 02/26/17; the patient has been to see his primary doctor Dr. Jerrel Ivory at [facility]. She started him on Lasix 20 mg and this seems to have helped with the edema. However we are not making substantial change with the left lateral calf wound and inflammation. The biopsy site on the right leg also looks stable but not really all that different. 03/12/17; the patient has been to see vein and vascular Dr. Lucky Cowboy. He has had venous reflux studies I have not reviewed these. I did get a call from his dermatology office. 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 They therefore recommended clobetasol and tetracycline i.e. 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 03/26/17; apparently the patient just had reflux studies not an appointment with Dr. dew. She arrives in clinic today having applied clobetasol for 2-3 weeks. He notes over the last 2-3 days excessive drainage having to change the dressing 3-4 times a day and also expanding erythema. 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 Lillia Carmel at [facility] dermatology locally who kindly saw him at my request. 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 My concern was the degree of erythema medially extending from his left leg wound which was either inflammation from  pyoderma or cellulitis. I put him on Augmentin however culture of the wound showed Pseudomonas which is quinolone sensitive. I really don't believe he has cellulitis however in view of everything I will continue and give him a course of Cipro. He is also on doxycycline as an immune modulator for the pyoderma. In addition to his original wound on the left lateral leg with surrounding erythema he has a wound on the right posterior calf which was an original biopsy site done by dermatology. 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 Saw Dr. Lillia Carmel yesterday. He has been using topical antibiotics to both wound areas his original wound on the left and the biopsies/pathergy area on the right. There is definitely some improvement in the inflammation around the wound on the right although the patient states he has increasing sensitivity of the wounds. He is on prednisone 60 and doxycycline 1 as prescribed by Dr. Lillia Carmel. He is covering the topical  antibiotic with gauze and putting this in his own compression stocks and changing this daily. He states that Dr. Lottie Rater did a culture of the left leg wound yesterday 05/07/17; pyoderma gangrenosum. The patient saw Dr. Lillia Carmel yesterday and has a follow-up with her in one month. He is still using topical antibiotics to both wounds although he can't recall exactly what type. He is still on prednisone 60 mg. Dr. Lillia Carmel stated that the doxycycline could stop if we were in agreement. 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 The right medial leg was induced by biopsy/pathergy. The area on the right is essentially healed. 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 The right medial leg has closed and  remains closed. He is still on prednisone 60. oHe tells me he missed his last dermatology appointment with Dr. Lillia Carmel but will make another appointment. He reports that her blood sugar at a recent screen in [place] was high 200's. He was 180 today. He is more cushingoid blood pressure is [place], Reichen ([id number]) up a bit. I think he is going to require still much longer prednisone perhaps another 3 months before attempting to taper. In the meantime his wound is a lot better. Smaller. He is cleaning this off daily and applying topical antibiotics. When he was last in the clinic I thought about changing to [facility] and actually put in a couple of calls to dermatology although probably not during their business hours. 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 He continues on oral prednisone. He has been using triple antibiotic ointment. There is surface debris and we will transition to [facility] and have him return in 2 weeks. He has lost 30 pounds since his last appointment with lifestyle modification. He may benefit from topical steroid cream for treatment this can be considered at a later date. 08/22/17 on evaluation today patient appears to actually be doing rather well in regard to his left lateral lower extremity ulcer. He has actually been managed by Dr. Dellia Nims most recently. Patient is currently on oral steroids at this time. This seems to have been of benefit for him. Nonetheless his last visit was actually with Leah on 08/06/17. Currently he is not utilizing any topical steroid creams although this could be of benefit as well. No fevers, chills, nausea, or vomiting noted at this time. 09/05/17 on evaluation today patient appears to be doing better in regard to his left lateral lower extremity ulcer. He has been tolerating the dressing changes without complication.  He is using Santyl with good effect. Overall I'm very pleased with how things are standing at this point. Patient likewise is happy that this is doing better. 09/19/17 on evaluation today patient actually appears to be doing rather well in regard to his left lateral lower extremity ulcer. 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 He's not having any significant pain. 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 He has been tolerating the Santyl without complication and in general I feel like he's making good  progress. 10/17/17 on evaluation today patient appears to be doing very well in regard to his left lateral lower surety ulcer. He has been tolerating the dressing changes without complication. There does not appear to be any evidence of infection he's alternating the Santyl and the triple antibiotic ointment every other day this seems to be doing well for him. 11/03/17 on evaluation today patient appears to be doing very well in regard to his left lateral lower extremity ulcer. He is been tolerating the dressing changes without complication which is good news. Fortunately there does not appear to be any evidence of infection which is also great news. Overall is doing excellent they are starting to taper down on the prednisone is down to 40 mg at this point it also started topical clobetasol for him. 11/17/17 on evaluation today patient appears to be doing well in regard to his left lateral lower surety ulcer. He's been tolerating the dressing changes without complication. He does note that he is having no pain, no excessive drainage or discharge, and overall he feels like things are going about how he would expect and hope they would. Overall he seems to have no evidence of infection at this time in my opinion which is  good news. 12/04/17-He is seen in follow-up evaluation for right lateral lower extremity ulcer. He has been applying topical steroid cream. Today's measurement show slight increase in size. Over the next 2 weeks we will transition to every other day Santyl and steroid cream. He has been encouraged to monitor for changes and notify clinic with any concerns 12/15/17 on evaluation today patient's left lateral motion the ulcer and fortunately is doing worse again at this point. This just since last week to this week has close to doubled in size according to the patient. I did not seeing last week's I do not have a visual to compare this to in our system was also down so we do not have all the charts and at this point. Nonetheless it does have me somewhat concerned in regard to the fact that again he was worried enough about it he has contact the dermatology that placed them back on the full strength, 50 mg a day of the prednisone that he was taken previous. He continues to alternate using clobetasol along with Santyl at this point. He is obviously somewhat frustrated. 12/22/17 on evaluation today patient appears to be doing a little worse compared to last evaluation. Unfortunately the wound is a little deeper and slightly larger than the last week's evaluation. With that being said he has made some progress in regard to the irritation surrounding at this time unfortunately despite that progress that's been made he still has a significant issue going on here. 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 For that reason I discussed with him today the possibility of perform a wound culture to ensure there's nothing overtly infected. 01/06/18 on evaluation today patient's wound is larger and deeper than previously evaluated. With that being said it did appear that  his wound was infected after my last evaluation with him. Subsequently I did end up prescribing a prescription for Bactrim DS which she has been taking and having no complication with. Fortunately there does not appear to be any evidence of Hupfer, Dailey ([id number]) infection at this point in time as far as anything spreading, no want to  touch, and overall I feel like things are showing signs of improvement. 01/13/18 on evaluation today patient appears to be even a little larger and deeper than last time. There still muscle exposed in the base of the wound. Nonetheless he does appear to be less erythematous I do believe inflammation is calming down also believe the infection looks like it's probably resolved at this time based on what I'm seeing. No fevers, chills, nausea, or vomiting noted at this time. 01/30/18 on evaluation today patient actually appears to visually look better for the most part. Unfortunately those visually this looks better he does seem to potentially have what may be an abscess in the muscle that has been noted in the central portion of the wound. This is the first time that I have noted what appears to be fluctuance in the central portion of the muscle. With that being said I'm somewhat more concerned about the fact that this might indicate an abscess formation at this location. I do believe that an ultrasound would be appropriate. This is likely something we need to try to do as soon as possible. He has been switch to mupirocin ointment and he is no longer using the steroid ointment as prescribed by dermatology he sees them again next week he's been decreased from 60 to 40 mg of prednisone. 03/09/18 on evaluation today patient actually appears to be doing a little better compared to last time I saw him. There's not as much erythema surrounding the wound itself. He I did review his most recent infectious disease note which was dated 02/24/18. He saw Dr. Michel Bickers in  [place]. 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 Patient is now off of antibiotics and they are continually observing this. There seems to be no change in the past two weeks in my pinion based on what the patient says and what I see today compared to what Dr. Megan Salon likely saw two weeks ago. No fevers, chills, nausea, or vomiting noted at this time. 03/23/18 on evaluation today patient's wound actually appears to be showing signs of improvement which is good news. He is currently still on the Dapsone. He is also working on tapering the prednisone to get off of this and Dr. Lottie Rater is working with him in this regard. Nonetheless overall I feel like the wound is doing well it does appear based on the infectious disease note that I reviewed from Dr. 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 I did also review the results of his ultrasound of left lower extremity which revealed there was a dentist tissue in the base of the wound without an abscess noted. 04/06/18 on evaluation today the patient's left lateral lower extremity ulcer actually appears to be doing fairly well which is excellent news. There does not appear to be any evidence of infection at this time which is also great news. Overall he still does have a significantly large ulceration although little by little he seems to be making progress. He is down to 10 mg a day of the prednisone. 04/20/18 on evaluation today patient actually appears to be doing excellent at this time in regard to his left lower extremity ulcer. He's making signs of good progress unfortunately this is taking much longer than we would really like  to see but nonetheless he is making progress. Fortunately there does not appear to be any evidence of infection at this  time. No fevers, chills, nausea, or vomiting noted at this time. The patient has not been using the Santyl due to the cost he hadn't got in this field yet. He's mainly been using the antibiotic ointment topically. Subsequently he also tells me that he really has not been scrubbing in the shower I think this would be helpful again as I told him it doesn't have to be anything too aggressive to even make it believe just enough to keep it free of some of the loose slough and biofilm on the wound surface. 05/11/18 on evaluation today patient's wound appears to be making slow but sure progress in regard to the left lateral lower extremity ulcer. He is been tolerating the dressing changes without complication. Fortunately there does not appear to be any evidence of infection at this time. He is still just using triple antibiotic ointment along with clobetasol occasionally over the area. He never got the Santyl and really does not seem to intend to in my pinion. 06/01/18 on evaluation today patient appears to be doing a little better in regard to his left lateral lower extremity ulcer. He states that overall he does not feel like he is doing as well with the Dapsone as he did with the prednisone. Nonetheless he sees his dermatologist later today and is gonna talk to them about the possibility of going back on the prednisone. Overall again I believe that the wound would be better if you would utilize Santyl but he really does not seem to be interested in going back to the [place] at this point. He has been using triple antibiotic ointment. 06/15/18 on evaluation today patient's wound actually appears to be doing about the same at this point. Fortunately there is no signs of infection at this time. He has made slight improvements although he continues to not really want to clean the wound bed at this point. He states that he just doesn't mess with it he doesn't want to cause any problems with everything else  he has going on. He has been on medication, antibiotics as prescribed by his dermatologist, for a staff infection of his lower extremities which is really drying out now and looking much better he tells me. Fortunately there is no sign of overall infection. MYCHEAL, VELDHUIZEN ([id number]) 06/29/18 on evaluation today patient appears to be doing well in regard to his left lateral lower surety ulcer all things considering. Fortunately his staff infection seems to be greatly improved compared to previous. He has no signs of infection and this is drying up quite nicely. He is still the doxycycline for this is no longer on cental, Dapsone, or any of the other medications. His dermatologist has recommended possibility of an infusion but right now he does not want to proceed with that. 07/13/18 on evaluation today patient appears to be doing about the same in regard to his left lateral lower surety ulcer. Fortunately there's no signs of infection at this time which is great news. Unfortunately he still builds up a significant amount of Slough/biofilm of the surface of the wound he still is not really cleaning this as he should be appropriately. Again I'm able to easily with saline and gauze remove the majority of this on the surface which if you would do this at home would likely be a dramatic improvement for him as far as getting the area  to improve. Nonetheless overall I still feel like he is making progress is just very slow. I think Santyl will be of benefit for him as well. Still he has not gotten this as of this point. 07/27/18 on evaluation today patient actually appears to be doing little worse in regards of the erythema around the periwound region of the wound he also tells me that he's been having more drainage currently compared to what he was experiencing last time I saw him. He states not quite as bad as what he had because this was infected previously but nonetheless is still appears to be doing  poorly. Fortunately there is no evidence of systemic infection at this point. The patient tells me that he is not going to be able to afford the Santyl. He is still waiting to hear about the infusion therapy with his dermatologist. Apparently she wants an updated colonoscopy first. 08/10/18 on evaluation today patient appears to be doing better in regard to his left lateral lower extremity ulcer. Fortunately he is showing signs of improvement in this regard he's actually been approved for Remicade infusion's as well although this has not been scheduled as of yet. Fortunately there's no signs of active infection at this time in regard to the wound although he is having some issues with infection of the right lower extremity is been seen as dermatologist for this. Fortunately they are definitely still working with him trying to keep things under control. 09/07/18 on evaluation today patient is actually doing rather well in regard to his left lateral lower extremity ulcer. He notes these actually having some hair grow back on his extremity which is something he has not seen in years. He also tells me that the pain is really not giving them any trouble at this time which is also good news overall she is very pleased with the progress he's using a combination of the mupirocin along with the probate is all mixed. 09/21/18 on evaluation today patient actually appears to be doing fairly well all things considered in regard to his looks from the ulcer. He's been tolerating the dressing changes without complication. Fortunately there's no signs of active infection at this time which is good news he is still on all antibiotics or prevention of the staff infection. He has been on prednisone for time although he states it is gonna contact his dermatologist and see if she put them on a short course due to some irritation that he has going on currently. Fortunately there's no evidence of any overall worsening this is  going very slow I think cental would be something that would be helpful for him although he states that $50 for tube is quite expensive. He therefore is not willing to get that at this point. 10/06/18 on evaluation today patient actually appears to be doing decently well in regard to his left lateral leg ulcer. He's been tolerating the dressing changes without complication. Fortunately there's no signs of active infection at this time. Overall I'm actually rather pleased with the progress he's making although it's slow he doesn't show any signs of infection and he does seem to be making some improvement. I do believe that he may need a switch up and dressings to try to help this to heal more appropriately and quickly. 10/19/18 on evaluation today patient actually appears to be doing better in regard to his left lateral lower extremity ulcer. This is shown signs of having much less Slough buildup at this point due to  the fact he has been using the Santyl. Obviously this is very good news. The overall size of the wound is not dramatically smaller but again the appearance is. 11/02/18 on evaluation today patient actually appears to be doing quite well in regard to his lower Trinity ulcer. A lot of the skin around the ulcer is actually somewhat irritating at this point this seems to be more due to the dressing causing irritation from the adhesive that anything else. Fortunately there is no signs of active infection at this time. 11/24/18 on evaluation today patient appears to be doing a little worse in regard to his overall appearance of his lower extremity ulcer. There's more erythema and warmth around the wound unfortunately. He is currently on doxycycline which he has been on for some time. With that being said I'm not sure that seems to be helping with what appears to possibly be an acute cellulitis with regard to his left lower extremity ulcer. No fevers, chills, nausea, or vomiting noted at this  time. 12/08/18 on evaluation today patient's wounds actually appears to be doing significantly better compared to his last evaluation. He has been using Santyl along with alternating tripling about appointment as well as the steroid cream seems to be doing Acord, Jaylon ([id number]) quite well and the wound is showing signs of improvement which is excellent news. Fortunately there's no evidence of infection and in fact his culture came back negative with only normal skin flora noted. 12/21/2018 upon evaluation today patient actually appears to be doing excellent with regard to his ulcer. This is actually the best that I have seen it since have been helping to take care of him. It is both smaller as well as less slough noted on the surface of the wound and seems to be showing signs of good improvement with new skin growing from the edges. He has been using just the triamcinolone he does wonder if he can get a refill of that ointment today. 01/04/2019 upon evaluation today patient actually appears to be doing well with regard to his left lateral lower extremity ulcer. With that being said it does not appear to be that he is doing quite as well as last time as far as progression is concerned. There does not appear to be any signs of infection or significant irritation which is good news. With that being said I do believe that he may benefit from switching to a collagen based dressing based on how clean The wound appears. 01/18/2019 on evaluation today patient actually appears to be doing well with regard to his wound on the left lower extremity. He is not made a lot of progress compared to where we were previous but nonetheless does seem to be doing okay at this time which is good news. There is no signs of active infection which is also good news. My only concern currently is I do wish we can get him into utilizing the collagen dressing his insurance would not pay for the supplies that we ordered although  it appears that he may be able to order this through his supply company that he typically utilizes. This is [employer]. Nonetheless he did try to order it during the office visit today and it appears this did go through. We will see if he can get that it is a different brand but nonetheless he has collagen and I do think will be beneficial. 02/01/2019 on evaluation today patient actually appears to be doing a little worse today in regard  to the overall size of his wounds. Fortunately there is no signs of active infection at this time. That is visually. Nonetheless when this is happened before it was due to infection. For that reason were somewhat concerned about that this time as well. 02/08/2019 on evaluation today patient unfortunately appears to be doing slightly worse with regard to his wound upon evaluation today. Is measuring a little deeper and a little larger unfortunately. I am not really sure exactly what is causing this to enlarge he actually did see his dermatologist she is going to see about initiating Humira for him. Subsequently she also did do steroid injections into the wound itself in the periphery. Nonetheless still nonetheless he seems to be getting a little bit larger he is gone back to just using the steroid cream topically which I think is appropriate. I would say hold off on the collagen for the time being is definitely a good thing to do. Based on the culture results which we finally did get the final result back regarding it shows staph as the bacteria noted again that can be a normal skin bacteria based on the fact however he is having increased drainage and worsening of the wound measurement wise I would go ahead and place him on an antibiotic today I do believe for this. 02/15/2019 on evaluation today patient actually appears to be doing somewhat better in regard to his ulcer. There is no signs of worsening at this time I did review his culture results which showed evidence  of Staphylococcus aureus but not MRSA. Again this could just be more related to the normal skin bacteria although he states the drainage has slowed down quite a bit he may have had a mild infection not just colonization. And was much smaller and then since around10/04/2019 on evaluation today patient appears to be doing unfortunately worse as far as the size of the wound. I really feel like that this is steadily getting larger again it had been doing excellent right at the beginning of September we have seen a steady increase in the area of the wound it is almost 2-1/2 times the size it was on September 1. Obviously this is a bad trend this is not wanting to see. For that reason we went back to using just the topical triamcinolone cream which does seem to help with inflammation. I checked him for bacteria by way of culture and nothing showed positive there. I am considering giving him a short course of a tapering steroid Dosepak today to see if that is can be beneficial for him. The patient is in agreement with giving that a try. 03/08/2019 on evaluation today patient appears to be doing very well in comparison to last evaluation with regard to his lower extremity ulcer. This is showing signs of less inflammation and actually measuring slightly smaller compared to last time every other week over the past month and a half he has been measuring larger larger larger. Nonetheless I do believe that the issue has been inflammation the prednisone does seem to have been beneficial for him which is good news. No fevers, chills, nausea, vomiting, or diarrhea. 03/22/2019 on evaluation today patient appears to be doing about the same with regard to his leg ulcer. He has been tolerating the dressing changes without complication. With that being said the wound seems to be mostly arrested at its current size but really is not making any progress except for when we prescribed the prednisone. He did show some  signs  of dropping as far as the overall size of the wound during that interval week. Nonetheless this is something he is not on long-term at this point and unfortunately I think he is getting need either this or else the Humira which his dermatologist has discussed try to get approval for. With that being said he will be seeing his dermatologist on the 11th of this month that is November. [name], [name] ([id number]) Electronic Signature(s) Signed: 03/22/2019 8:50:45 AM By: Worthy Keeler PA-C Entered By: Worthy Keeler on 03/22/2019 08:50:45 [name], [name] ([id number]) -------------------------------------------------------------------------------- Physical Exam Details Patient Name: [name] Date of Service: 03/22/2019 8:00 AM Medical Record Number: [id number] Patient Account Number: [id number] Date of Birth/Sex: 22-Jun-1980 (38 y.o. M) Treating RN: Harold Barban Primary Care Provider: [name] Other Clinician: Referring Provider: [name] Treating Provider/Extender: Melburn Hake, Tally Mckinnon Weeks in Treatment: 35 Constitutional Well-nourished and well-hydrated in no acute distress. Respiratory normal breathing without difficulty. clear to auscultation bilaterally. Cardiovascular regular rate and rhythm with normal S1, S2. Psychiatric this patient is able to make decisions and demonstrates good insight into disease process. Alert and Oriented x 3. pleasant and cooperative. Notes Upon evaluation today patient's wound actually appears to be fairly clean unfortunately there just really is not a lot of new skin growth at this point. He has been tolerating the dressing changes without complication which is great news and overall I see no concerns in that regard. I do believe that the patient is going to likely need something to help cut the inflammation if not prednisone and then possibly the Humira which his dermatologist was working on trying to obtain approval for. Nonetheless  at this point he has not heard about the approval being processed as of yet we will see where things stand when he sees her on the 11th of this month. Electronic Signature(s) Signed: 03/22/2019 8:51:40 AM By: Worthy Keeler PA-C Entered By: Worthy Keeler on 03/22/2019 08:51:40 [name], [name] ([id number]) -------------------------------------------------------------------------------- Physician Orders Details Patient Name: [name] Date of Service: 03/22/2019 8:00 AM Medical Record Number: [id number] Patient Account Number: [id number] Date of Birth/Sex: July 07, 1980 (38 y.o. 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 Anesthetic (add to Medication List) Wound #1 Left,Lateral Lower Leg o Topical Lidocaine 4% cream applied to wound bed prior to debridement (In Clinic Only). Primary Wound Dressing Wound #1 Left,Lateral Lower Leg o Other: - TCA to wound bed only Secondary Dressing Wound #1 Left,Lateral Lower Leg o Boardered Foam Dressing Dressing Change Frequency Wound #1 Left,Lateral Lower Leg o Change dressing every other day. Follow-up Appointments Wound #1 Left,Lateral Lower Leg o Return Appointment in 2 weeks. Edema Control Wound #1 Left,Lateral Lower Leg o Elevate legs to the level of the heart and pump ankles as often as possible Electronic Signature(s) Signed: 03/22/2019 4:25:43 PM By: Harold Barban Signed: 03/24/2019 2:09:51 AM By: Worthy Keeler PA-C Entered By: Harold Barban on 03/22/2019  08:22:49 [name], [name] ([id number]) [name], [name] ([id number]) -------------------------------------------------------------------------------- Problem List Details Patient Name: [name] Date of Service: 03/22/2019 8:00 AM Medical Record Number: [id number] Patient Account Number: [id number] Date of Birth/Sex: January 15, 1981 (38 y.o. M) Treating RN:  Harold Barban Primary Care Provider: [name] Other Clinician: Referring Provider: [name] Treating Provider/Extender: Melburn Hake, Mckensey Berghuis Weeks in Treatment: 119 Active Problems ICD-10 Evaluated Encounter Code Description Active Date Today Diagnosis L97.222 Non-pressure chronic ulcer of left calf with fat layer exposed 12/04/2016 No Yes L88 Pyoderma gangrenosum 03/26/2017 No Yes I87.2 Venous insufficiency (chronic) (peripheral) 12/04/2016 No Yes Inactive Problems ICD-10 Code Description Active Date Inactive Date L97.213 Non-pressure chronic ulcer of right calf with necrosis of muscle 04/02/2017 04/02/2017 Resolved Problems Electronic Signature(s) Signed: 03/22/2019 8:07:53 AM By: Worthy Keeler PA-C Entered By: Worthy Keeler on 03/22/2019 08:07:53 [name], [name] ([id number]) -------------------------------------------------------------------------------- Progress Note Details Patient Name: [name] Date of Service: 03/22/2019 8:00 AM Medical Record Number: [id number] Patient Account Number: [id number] Date of Birth/Sex: 17-Apr-1981 (38 y.o. M) Treating RN: Harold Barban Primary Care Provider: [name] Other Clinician: Referring Provider: [name] Treating Provider/Extender: Melburn Hake, Alline Pio Weeks in Treatment: 119 Subjective Chief Complaint Information obtained from Patient He is here in follow up evaluation for LLE pyoderma ulcer History of Present Illness (HPI) 12/04/16; 38 year old man who comes into the clinic today for review of a wound on the posterior left calf. He tells me that is been there  for about a year. He is not a diabetic he does smoke half a pack per day. He was seen in the ER on 11/20/16 felt to have cellulitis around the wound and was given clindamycin. An x-ray did not show osteomyelitis. The patient initially tells me that he has a milk allergy that sets off a pruritic itching rash on his lower legs which she scratches incessantly and he thinks that's what may have set up the wound. He has been using various topical antibiotics and ointments without any effect. He works in a [employer]ing [employer] and is on his feet all day. He does not have a prior history of wounds however he does have the rash on both lower legs the right arm and the ventral aspect of his left arm. These are excoriations and clearly have had scratching however there are of macular looking areas on both legs including a substantial larger area on the right leg. This does not have an underlying open area. There is no blistering. The patient tells me that 2 years ago in [place] in response to the rash on his legs he saw a dermatologist who told him he had a condition which may be pyoderma gangrenosum although I may be putting words into his mouth. He seemed to recognize this. 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 He is not in any treatment for this. He's had no recent travel 12/11/16; the patient arrives today with his wound and roughly the same condition we've been using silver alginate this is a deep punched out wound with some surrounding erythema but no tenderness. Biopsy I did did not show confirmed pyoderma gangrenosum suggested nonspecific inflammation and vasculitis but does not provide an actual description of what was seen by the pathologist. I'm really not able to understand this We have also received information from the patient's dermatologist in [place] notes from April 2016. This was a doctor Agarwal- antal. 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 He was prescribed topical steroid high potency under occlusion which helped but at this point the patient did not have a deep punched out wound. 12/18/16; the patient's wound is larger in terms of surface area however this surface looks better and there is  less depth. The surrounding erythema also is better. The patient states that the wrap we put on came off 2 days ago when he has been using his compression stockings. He we are in the process of getting a dermatology consult. 12/26/16 on evaluation today patient's left lower extremity wound shows evidence of infection with surrounding erythema noted. He has been tolerating the dressing changes but states that he has noted more discomfort. There is a larger area of erythema surrounding the wound. No fevers, chills, nausea, or vomiting noted at this time. With that being said the wound still does have slough covering the surface. He is not allergic to any medication that he is aware of at this point. In regard to his right lower extremity he had several regions that are erythematous and pruritic he wonders if there's anything we can do to help that. 01/02/17 I reviewed patient's wound culture which was obtained his visit last week. He was placed on doxycycline at that point. Unfortunately that does not appear to be an antibiotic that would likely help with the situation however the pseudomonas noted on culture is sensitive to Cipro. Also unfortunately patient's wound seems to have a large compared to last week's evaluation. Not severely so but there are definitely increased measurements in general. He is continuing to have discomfort as well he writes this to be a seven out of 10. In fact he would prefer me not to perform any debridement today due to the fact that he is having discomfort and considering he has an active infection on the little reluctant to do so anyway. No fevers, [name], [name] ([id number]) chills, nausea, or vomiting noted at  this time. 01/08/17; patient seems dermatology on September 5. I suspect dermatology will want the slides from the biopsy I did sent to their pathologist. I'm not sure if there is a way we can expedite that. In any case the culture I did before I left on vacation 3 weeks ago showed Pseudomonas he was given 10 days of Cipro and per her description of her intake nurses is actually somewhat better this week although the wound is quite a bit bigger than I remember the last time I saw this. He still has 3 more days of Cipro 01/21/17; dermatology appointment tomorrow. He has completed the ciprofloxacin for Pseudomonas. Surface of the wound looks better however he is had some deterioration in the lesions on his right leg. Meantime the left lateral leg wound we will continue with sample 01/29/17; patient had his dermatology appointment but I can't yet see that note. He is completed his antibiotics. The wound is more superficial but considerably larger in circumferential area than when he came in. This is in his left lateral calf. He also has swollen erythematous areas with superficial wounds on the right leg and small papular areas on both arms. There apparently areas in her his upper thighs and buttocks I did not look at those. Dermatology biopsied the right leg. Hopefully will have their input next week. 02/05/17; patient went back to see his dermatologist who told him that he had a "scratching problem" as well as staph. He is now on a 30 day course of doxycycline and I believe she gave him triamcinolone cream to the right leg areas to help with the itching [not exactly sure but probably triamcinolone]. She apparently looked at the left lateral leg wound although this was not rebiopsied and I think felt to be ultimately part of the same pathogenesis. He is using  sample border foam and changing nevus himself. He now has a new open area on the right posterior leg which was his biopsy site I don't have any of  the dermatology notes 02/12/17; we put the patient in compression last week with SANTYL to the wound on the left leg and the biopsy. Edema is much better and the depth of the wound is now at level of skin. Area is still the same Biopsy site on the right lateral leg we've also been using santyl with a border foam dressing and he is changing this himself. 02/19/17; Using silver alginate started last week to both the substantial left leg wound and the biopsy site on the right wound. He is tolerating compression well. Has a an appointment with his primary M.D. tomorrow wondering about diuretics although I'm wondering if the edema problem is actually lymphedema 02/26/17; the patient has been to see his primary doctor Dr. Jerrel Ivory at [place] our primary care. She started him on Lasix 20 mg and this seems to have helped with the edema. However we are not making substantial change with the left lateral calf wound and inflammation. The biopsy site on the right leg also looks stable but not really all that different. 03/12/17; the patient has been to see vein and vascular Dr. Lucky Cowboy. He has had venous reflux studies I have not reviewed these. I did get a call from his dermatology office. 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 They therefore recommended clobetasol and tetracycline i.e. 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 03/26/17; apparently the patient just had reflux studies not an appointment with Dr. dew. She arrives in clinic today having applied clobetasol for 2-3 weeks. He notes over the last 2-3 days excessive drainage having to change the dressing 3-4 times a day and also expanding erythema. He states the expanding  erythema seems to come and go and was last this red was earlier in the month.he is on doxycycline 150 mg twice a day as an anti-inflammatory systemic therapy for possible pyoderma gangrenosum along with the topical clobetasol 04/02/17; the patient was seen last week by Dr. Lillia Carmel at [facility] dermatology locally who kindly saw him at my request. 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 My concern was the degree of erythema medially extending from his left leg wound which was either inflammation from pyoderma or cellulitis. I put him on Augmentin however culture of the wound showed Pseudomonas which is quinolone sensitive. I really don't believe he has cellulitis however in view of everything I will continue and give him a course of Cipro. He is also on doxycycline as an immune modulator for the pyoderma. In addition to his original wound on the left lateral leg with surrounding erythema he has a wound on the right posterior calf which was an original biopsy site done by dermatology. 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 Saw Dr. Lillia Carmel yesterday. He has been using topical antibiotics to both wound areas his original wound on the left and the biopsies/pathergy area on the right. There is definitely some improvement in the inflammation around the wound on the right although the patient  states he has increasing sensitivity of the wounds. He is on prednisone 60 and doxycycline 1 as prescribed by Dr. Lillia Carmel. He is covering the topical antibiotic with gauze and putting this in his own compression stocks and changing this daily. He states that Dr. Lottie Rater did a culture of the left leg wound yesterday 05/07/17; pyoderma gangrenosum. The patient saw Dr. Lillia Carmel yesterday and has a follow-up with her in one month. He is still using topical antibiotics to both wounds although he  can't recall exactly what type. He is still on prednisone 60 mg. Dr. Lillia Carmel stated that the doxycycline could stop if we were in agreement. 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 The right medial leg was induced by [name] ([id number]) biopsy/pathergy. The area on the right is essentially healed. 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 The right medial leg has closed and remains closed. He is still on prednisone 60. He tells me he missed his last dermatology appointment with Dr. Lillia Carmel but will make another appointment. He reports that her blood sugar at a recent screen in [place] was high 200's. He was 180 today. 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 I think he is going to require still much longer prednisone perhaps another 3 months before attempting to taper. In the meantime his wound is a lot better. Smaller. He is cleaning this off daily and applying topical antibiotics. When he was last in the clinic I thought about changing to [facility] and actually put in a couple of calls to dermatology although probably not during their business hours. 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 He continues on oral prednisone. He has been using triple antibiotic ointment. There is surface debris and we will transition to [facility] and have him return in 2 weeks. He has lost 30 pounds since his last appointment with lifestyle modification. He may benefit from topical steroid cream for treatment this can be considered at a later date. 08/22/17 on evaluation today patient appears to actually be doing rather well in regard to his left lateral lower extremity  ulcer. He has actually been managed by Dr. Dellia Nims most recently. Patient is currently on oral steroids at this time. This seems to have been of benefit for him. Nonetheless his last visit was actually with Leah on 08/06/17. Currently he is not utilizing any topical steroid creams although this could be of benefit as well. No fevers, chills, nausea, or vomiting noted at this time. 09/05/17 on evaluation today patient appears to be doing better in regard to his left lateral lower extremity ulcer. He has been tolerating the dressing changes without complication. He is using Santyl with good effect. Overall I'm very pleased with how things are standing at this point. Patient likewise is happy that this is doing better. 09/19/17 on evaluation today patient actually appears to be doing rather well in regard to his left lateral lower extremity ulcer. 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 He's not having any significant pain. 10/03/17 on evaluation today patient appears to be doing excellent in regard to his lower  extremity wound on the left secondary to Pyoderma gangrenosum. He has been tolerating the Santyl without complication and in general I feel like he's making good progress. 10/17/17 on evaluation today patient appears to be doing very well in regard to his left lateral lower surety ulcer. He has been tolerating the dressing changes without complication. There does not appear to be any evidence of infection he's alternating the Santyl and the triple antibiotic ointment every other day this seems to be doing well for him. 11/03/17 on evaluation today patient appears to be doing very well in regard to his left lateral lower extremity ulcer. He is been tolerating the dressing changes without complication which is good news. Fortunately there does not appear to be any evidence of infection which is also great news. Overall is  doing excellent they are starting to taper down on the prednisone is down to 40 mg at this point it also started topical clobetasol for him. 11/17/17 on evaluation today patient appears to be doing well in regard to his left lateral lower surety ulcer. He's been tolerating the dressing changes without complication. He does note that he is having no pain, no excessive drainage or discharge, and overall he feels like things are going about how he would expect and hope they would. Overall he seems to have no evidence of infection at this time in my opinion which is good news. 12/04/17-He is seen in follow-up evaluation for right lateral lower extremity ulcer. He has been applying topical steroid cream. Today's measurement show slight increase in size. Over the next 2 weeks we will transition to every other day Santyl and steroid cream. He has been encouraged to monitor for changes and notify clinic with any concerns 12/15/17 on evaluation today patient's left lateral motion the ulcer and fortunately is doing worse again at this point. This just since last week to this week has close to doubled in size according to the patient. I did not seeing last week's I do not have a visual to compare this to in our system was also down so we do not have all the charts and at this point. Nonetheless it does have me somewhat concerned in regard to the fact that again he was worried enough about it he has contact the dermatology that placed them back on the full strength, 50 mg a day of the prednisone that he was taken previous. He continues to alternate using clobetasol along with Santyl at this point. He is obviously somewhat frustrated. 12/22/17 on evaluation today patient appears to be doing a little worse compared to last evaluation. Unfortunately the wound is a little deeper and slightly larger than the last week's evaluation. With that being said he has made some progress in regard to the irritation surrounding at  this time unfortunately despite that progress that's been made he still has a significant issue going on here. I'm not certain that he is having really any true infection at this time although with the Pyoderma gangrenosum it can [name], [name] ([id number]) sometimes be difficult to differentiate infection versus just inflammation. For that reason I discussed with him today the possibility of perform a wound culture to ensure there's nothing overtly infected. 01/06/18 on evaluation today patient's wound is larger and deeper than previously evaluated. With that being said it did appear that his wound was infected after my last evaluation with him. Subsequently I did end up prescribing a prescription for Bactrim DS which she has been taking and having no  complication with. Fortunately there does not appear to be any evidence of infection at this point in time as far as anything spreading, no want to touch, and overall I feel like things are showing signs of improvement. 01/13/18 on evaluation today patient appears to be even a little larger and deeper than last time. There still muscle exposed in the base of the wound. Nonetheless he does appear to be less erythematous I do believe inflammation is calming down also believe the infection looks like it's probably resolved at this time based on what I'm seeing. No fevers, chills, nausea, or vomiting noted at this time. 01/30/18 on evaluation today patient actually appears to visually look better for the most part. Unfortunately those visually this looks better he does seem to potentially have what may be an abscess in the muscle that has been noted in the central portion of the wound. This is the first time that I have noted what appears to be fluctuance in the central portion of the muscle. With that being said I'm somewhat more concerned about the fact that this might indicate an abscess formation at this location. I do believe that an ultrasound would be  appropriate. This is likely something we need to try to do as soon as possible. He has been switch to mupirocin ointment and he is no longer using the steroid ointment as prescribed by dermatology he sees them again next week he's been decreased from 60 to 40 mg of prednisone. 03/09/18 on evaluation today patient actually appears to be doing a little better compared to last time I saw him. There's not as much erythema surrounding the wound itself. He I did review his most recent infectious disease note which was dated 02/24/18. He saw Dr. Michel Bickers in [place]. 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 Patient is now off of antibiotics and they are continually observing this. There seems to be no change in the past two weeks in my pinion based on what the patient says and what I see today compared to what Dr. Megan Salon likely saw two weeks ago. No fevers, chills, nausea, or vomiting noted at this time. 03/23/18 on evaluation today patient's wound actually appears to be showing signs of improvement which is good news. He is currently still on the Dapsone. He is also working on tapering the prednisone to get off of this and Dr. Lottie Rater is working with him in this regard. Nonetheless overall I feel like the wound is doing well it does appear based on the infectious disease note that I reviewed from Dr. 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 I did also review the results of his ultrasound of left lower extremity which revealed there was a dentist tissue in the base of the wound without an abscess noted. 04/06/18 on evaluation today the patient's left lateral lower extremity ulcer actually appears to be doing fairly well which is excellent news. There does not appear to be any evidence of  infection at this time which is also great news. Overall he still does have a significantly large ulceration although little by little he seems to be making progress. He is down to 10 mg a day of the prednisone. 04/20/18 on evaluation today patient actually appears to be doing excellent at  this time in regard to his left lower extremity ulcer. He's making signs of good progress unfortunately this is taking much longer than we would really like to see but nonetheless he is making progress. Fortunately there does not appear to be any evidence of infection at this time. No fevers, chills, nausea, or vomiting noted at this time. The patient has not been using the Santyl due to the cost he hadn't got in this field yet. He's mainly been using the antibiotic ointment topically. Subsequently he also tells me that he really has not been scrubbing in the shower I think this would be helpful again as I told him it doesn't have to be anything too aggressive to even make it believe just enough to keep it free of some of the loose slough and biofilm on the wound surface. 05/11/18 on evaluation today patient's wound appears to be making slow but sure progress in regard to the left lateral lower extremity ulcer. He is been tolerating the dressing changes without complication. Fortunately there does not appear to be any evidence of infection at this time. He is still just using triple antibiotic ointment along with clobetasol occasionally over the area. He never got the Santyl and really does not seem to intend to in my pinion. 06/01/18 on evaluation today patient appears to be doing a little better in regard to his left lateral lower extremity ulcer. He states that overall he does not feel like he is doing as well with the Dapsone as he did with the prednisone. Nonetheless he sees his dermatologist later today and is gonna talk to them about the possibility of going back on the prednisone. Overall again I believe  that the wound would be better if you would utilize Santyl but he really does not seem to be interested in going back to the [place] at this point. He has been using triple antibiotic ointment. LEODAN, BOLYARD ([id number]) 06/15/18 on evaluation today patient's wound actually appears to be doing about the same at this point. Fortunately there is no signs of infection at this time. He has made slight improvements although he continues to not really want to clean the wound bed at this point. He states that he just doesn't mess with it he doesn't want to cause any problems with everything else he has going on. He has been on medication, antibiotics as prescribed by his dermatologist, for a staff infection of his lower extremities which is really drying out now and looking much better he tells me. Fortunately there is no sign of overall infection. 06/29/18 on evaluation today patient appears to be doing well in regard to his left lateral lower surety ulcer all things considering. Fortunately his staff infection seems to be greatly improved compared to previous. He has no signs of infection and this is drying up quite nicely. He is still the doxycycline for this is no longer on cental, Dapsone, or any of the other medications. His dermatologist has recommended possibility of an infusion but right now he does not want to proceed with that. 07/13/18 on evaluation today patient appears to be doing about the same in regard to his left lateral lower surety ulcer. Fortunately there's no signs of infection at this time which is great news. Unfortunately he still builds up a significant amount of Slough/biofilm of the surface of the wound he still is not really cleaning this as he should be appropriately. Again I'm able to easily with saline and gauze remove the majority of  this on the surface which if you would do this at home would likely be a dramatic improvement for him as far as getting the area to improve.  Nonetheless overall I still feel like he is making progress is just very slow. I think Santyl will be of benefit for him as well. Still he has not gotten this as of this point. 07/27/18 on evaluation today patient actually appears to be doing little worse in regards of the erythema around the periwound region of the wound he also tells me that he's been having more drainage currently compared to what he was experiencing last time I saw him. He states not quite as bad as what he had because this was infected previously but nonetheless is still appears to be doing poorly. Fortunately there is no evidence of systemic infection at this point. The patient tells me that he is not going to be able to afford the Santyl. He is still waiting to hear about the infusion therapy with his dermatologist. Apparently she wants an updated colonoscopy first. 08/10/18 on evaluation today patient appears to be doing better in regard to his left lateral lower extremity ulcer. Fortunately he is showing signs of improvement in this regard he's actually been approved for Remicade infusion's as well although this has not been scheduled as of yet. Fortunately there's no signs of active infection at this time in regard to the wound although he is having some issues with infection of the right lower extremity is been seen as dermatologist for this. Fortunately they are definitely still working with him trying to keep things under control. 09/07/18 on evaluation today patient is actually doing rather well in regard to his left lateral lower extremity ulcer. He notes these actually having some hair grow back on his extremity which is something he has not seen in years. He also tells me that the pain is really not giving them any trouble at this time which is also good news overall she is very pleased with the progress he's using a combination of the mupirocin along with the probate is all mixed. 09/21/18 on evaluation today patient  actually appears to be doing fairly well all things considered in regard to his looks from the ulcer. He's been tolerating the dressing changes without complication. Fortunately there's no signs of active infection at this time which is good news he is still on all antibiotics or prevention of the staff infection. He has been on prednisone for time although he states it is gonna contact his dermatologist and see if she put them on a short course due to some irritation that he has going on currently. Fortunately there's no evidence of any overall worsening this is going very slow I think cental would be something that would be helpful for him although he states that $50 for tube is quite expensive. He therefore is not willing to get that at this point. 10/06/18 on evaluation today patient actually appears to be doing decently well in regard to his left lateral leg ulcer. He's been tolerating the dressing changes without complication. Fortunately there's no signs of active infection at this time. Overall I'm actually rather pleased with the progress he's making although it's slow he doesn't show any signs of infection and he does seem to be making some improvement. I do believe that he may need a switch up and dressings to try to help this to heal more appropriately and quickly. 10/19/18 on evaluation today patient actually appears to be  doing better in regard to his left lateral lower extremity ulcer. This is shown signs of having much less Slough buildup at this point due to the fact he has been using the [employer]. Obviously this is very good news. The overall size of the wound is not dramatically smaller but again the appearance is. 11/02/18 on evaluation today patient actually appears to be doing quite well in regard to his lower [place] ulcer. A lot of the skin around the ulcer is actually somewhat irritating at this point this seems to be more due to the dressing causing irritation from the adhesive  that anything else. Fortunately there is no signs of active infection at this time. 11/24/18 on evaluation today patient appears to be doing a little worse in regard to his overall appearance of his lower extremity Panik, Rodrigo ([id number]) ulcer. There's more erythema and warmth around the wound unfortunately. He is currently on doxycycline which he has been on for some time. With that being said I'm not sure that seems to be helping with what appears to possibly be an acute cellulitis with regard to his left lower extremity ulcer. No fevers, chills, nausea, or vomiting noted at this time. 12/08/18 on evaluation today patient's wounds actually appears to be doing significantly better compared to his last evaluation. He has been using Santyl along with alternating tripling about appointment as well as the steroid cream seems to be doing quite well and the wound is showing signs of improvement which is excellent news. Fortunately there's no evidence of infection and in fact his culture came back negative with only normal skin flora noted. 12/21/2018 upon evaluation today patient actually appears to be doing excellent with regard to his ulcer. This is actually the best that I have seen it since have been helping to take care of him. It is both smaller as well as less slough noted on the surface of the wound and seems to be showing signs of good improvement with new skin growing from the edges. He has been using just the triamcinolone he does wonder if he can get a refill of that ointment today. 01/04/2019 upon evaluation today patient actually appears to be doing well with regard to his left lateral lower extremity ulcer. With that being said it does not appear to be that he is doing quite as well as last time as far as progression is concerned. There does not appear to be any signs of infection or significant irritation which is good news. With that being said I do believe that he may benefit from  switching to a collagen based dressing based on how clean The wound appears. 01/18/2019 on evaluation today patient actually appears to be doing well with regard to his wound on the left lower extremity. He is not made a lot of progress compared to where we were previous but nonetheless does seem to be doing okay at this time which is good news. There is no signs of active infection which is also good news. My only concern currently is I do wish we can get him into utilizing the collagen dressing his insurance would not pay for the supplies that we ordered although it appears that he may be able to order this through his supply company that he typically utilizes. This is [employer]. Nonetheless he did try to order it during the office visit today and it appears this did go through. We will see if he can get that it is a different brand but nonetheless  he has collagen and I do think will be beneficial. 02/01/2019 on evaluation today patient actually appears to be doing a little worse today in regard to the overall size of his wounds. Fortunately there is no signs of active infection at this time. That is visually. Nonetheless when this is happened before it was due to infection. For that reason were somewhat concerned about that this time as well. 02/08/2019 on evaluation today patient unfortunately appears to be doing slightly worse with regard to his wound upon evaluation today. Is measuring a little deeper and a little larger unfortunately. I am not really sure exactly what is causing this to enlarge he actually did see his dermatologist she is going to see about initiating Humira for him. Subsequently she also did do steroid injections into the wound itself in the periphery. Nonetheless still nonetheless he seems to be getting a little bit larger he is gone back to just using the steroid cream topically which I think is appropriate. I would say hold off on the collagen for the time being is definitely  a good thing to do. Based on the culture results which we finally did get the final result back regarding it shows staph as the bacteria noted again that can be a normal skin bacteria based on the fact however he is having increased drainage and worsening of the wound measurement wise I would go ahead and place him on an antibiotic today I do believe for this. 02/15/2019 on evaluation today patient actually appears to be doing somewhat better in regard to his ulcer. There is no signs of worsening at this time I did review his culture results which showed evidence of Staphylococcus aureus but not MRSA. Again this could just be more related to the normal skin bacteria although he states the drainage has slowed down quite a bit he may have had a mild infection not just colonization. And was much smaller and then since around10/04/2019 on evaluation today patient appears to be doing unfortunately worse as far as the size of the wound. I really feel like that this is steadily getting larger again it had been doing excellent right at the beginning of September we have seen a steady increase in the area of the wound it is almost 2-1/2 times the size it was on September 1. Obviously this is a bad trend this is not wanting to see. For that reason we went back to using just the topical triamcinolone cream which does seem to help with inflammation. I checked him for bacteria by way of culture and nothing showed positive there. I am considering giving him a short course of a tapering steroid Dosepak today to see if that is can be beneficial for him. The patient is in agreement with giving that a try. 03/08/2019 on evaluation today patient appears to be doing very well in comparison to last evaluation with regard to his lower extremity ulcer. This is showing signs of less inflammation and actually measuring slightly smaller compared to last time every other week over the past month and a half he has been  measuring larger larger larger. Nonetheless I do believe that the issue has been inflammation the prednisone does seem to have been beneficial for him which is good news. No fevers, chills, nausea, vomiting, or diarrhea. [name], [place] ([id number]) 03/22/2019 on evaluation today patient appears to be doing about the same with regard to his leg ulcer. He has been tolerating the dressing changes without complication. With that being  said the wound seems to be mostly arrested at its current size but really is not making any progress except for when we prescribed the prednisone. He did show some signs of dropping as far as the overall size of the wound during that interval week. Nonetheless this is something he is not on long-term at this point and unfortunately I think he is getting need either this or else the Humira which his dermatologist has discussed try to get approval for. With that being said he will be seeing his dermatologist on the 11th of this month that is November. Patient History Information obtained from Patient. Family History Diabetes - Mother,Maternal Grandparents, Heart Disease - Mother,Maternal Grandparents, Hereditary Spherocytosis - Siblings, Hypertension - Maternal Grandparents, Stroke - Siblings, No family history of Cancer, Kidney Disease, Lung Disease, Seizures, Thyroid Problems, Tuberculosis. Social History Current some day smoker, Marital Status - Married, Alcohol Use - Rarely, Drug Use - No History, Caffeine Use - Moderate. Medical History Eyes Denies history of Cataracts, Glaucoma, Optic Neuritis Ear/Nose/Mouth/Throat Denies history of Chronic sinus problems/congestion, Middle ear problems Hematologic/Lymphatic Denies history of Anemia, Hemophilia, Human Immunodeficiency Virus, Lymphedema, Sickle Cell Disease Respiratory Patient has history of Sleep Apnea - cpap Denies history of Aspiration, Asthma, Chronic Obstructive Pulmonary Disease (COPD),  Pneumothorax Cardiovascular Patient has history of Hypertension Denies history of Angina, Arrhythmia, Congestive Heart Failure, Coronary Artery Disease, Deep Vein Thrombosis, Hypotension, Myocardial Infarction, Peripheral Arterial Disease, Peripheral Venous Disease, Phlebitis, Vasculitis Gastrointestinal Patient has history of Colitis Endocrine Denies history of Type I Diabetes, Type II Diabetes Genitourinary Denies history of End Stage Renal Disease Immunological Denies history of Lupus Erythematosus, Raynaud s, Scleroderma Integumentary (Skin) Denies history of History of Burn, History of pressure wounds Musculoskeletal Denies history of Gout, Rheumatoid Arthritis, Osteoarthritis, Osteomyelitis Neurologic Denies history of Dementia, Neuropathy, Quadriplegia, Paraplegia, Seizure Disorder Oncologic Denies history of Received Chemotherapy, Received Radiation Psychiatric Denies history of Anorexia/bulimia, Confinement Anxiety Review of Systems (ROS) Constitutional Symptoms (General Health) Denies complaints or symptoms of Fatigue, Fever, Chills, Marked Weight Change. Respiratory Denies complaints or symptoms of Chronic or frequent coughs, Shortness of Breath. Cardiovascular Denies complaints or symptoms of Chest pain, LE edema. Psychiatric [name], [name] ([id number]) Denies complaints or symptoms of Anxiety, Claustrophobia. Objective Constitutional Well-nourished and well-hydrated in no acute distress. Vitals Time Taken: 8:08 AM, Height: 71 in, Weight: 338 lbs, BMI: 47.1, Temperature: 98.8 F, Pulse: 92 bpm, Respiratory Rate: 20 breaths/min, Blood Pressure: 140/71 mmHg. Respiratory normal breathing without difficulty. clear to auscultation bilaterally. Cardiovascular regular rate and rhythm with normal S1, S2. Psychiatric this patient is able to make decisions and demonstrates good insight into disease process. Alert and Oriented x 3. pleasant and cooperative. General Notes:  Upon evaluation today patient's wound actually appears to be fairly clean unfortunately there just really is not a lot of new skin growth at this point. He has been tolerating the dressing changes without complication which is great news and overall I see no concerns in that regard. I do believe that the patient is going to likely need something to help cut the inflammation if not prednisone and then possibly the Humira which his dermatologist was working on trying to obtain approval for. Nonetheless at this point he has not heard about the approval being processed as of yet we will see where things stand when he sees her on the 11th of this month. Integumentary (Hair, Skin) Wound #1 status is Open. Original cause of wound was Gradually Appeared. The wound is located on the Left,Lateral  Lower Leg. The wound measures 4.2cm length x 3cm width x 0.5cm depth; 9.896cm^2 area and 4.948cm^3 volume. There is Fat Layer (Subcutaneous Tissue) Exposed exposed. There is no tunneling or undermining noted. There is a medium amount of serous drainage noted. The wound margin is distinct with the outline attached to the wound base. There is small (1-33%) red granulation within the wound bed. There is a large (67-100%) amount of necrotic tissue within the wound bed including Adherent Slough. 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 Anesthetic (add to Medication List): Wound #1 Left,Lateral Lower Leg: Topical Lidocaine 4% cream applied to wound bed prior to debridement (In Clinic Only). Primary Wound Dressing: Wound #1 Left,Lateral Lower Leg: Other: - TCA to wound bed only Secondary Dressing: Wound #1 Left,Lateral Lower Leg: Boardered Foam Dressing Dressing Change  Frequency: Wound #1 Left,Lateral Lower Leg: Change dressing every other day. Follow-up Appointments: Wound #1 Left,Lateral Lower Leg: Return Appointment in 2 weeks. Edema Control: Wound #1 Left,Lateral Lower Leg: Elevate legs to the level of the heart and pump ankles as often as possible 1. I would recommend for the time being that we continue with the thin film of triamcinolone ointment to the wound bed that he has been utilizing that seems to at least keep things under control as far as maintaining anti-inflammatory processes. 2. With regard to swelling I still recommend the patient elevate his legs as much as possible and I do believe that compression socks would be beneficial even if just over-the-counter and the 8-12 mmHg range. 3. I do still believe that the patient shows no signs of infection and I believe that the inflammation that he is experiencing is from the underlying pyoderma gangrenosum. He is still awaiting whether or not he has been approved for Humira although as long as its been I am somewhat reluctant to think that has been approved. 4. With regard to oral steroid therapy he states he is good I discussed this with his dermatologist when he goes back that is when he did the best when he was on the oral steroids previous. We will see patient back for reevaluation in 2 weeks here in the clinic. If anything worsens or changes patient will contact our office for additional recommendations. Electronic Signature(s) Signed: 03/22/2019 8:53:27 AM By: Worthy Keeler PA-C Entered By: Worthy Keeler on 03/22/2019 08:53:26 Witham, Kyndall ([id number]) -------------------------------------------------------------------------------- ROS/PFSH Details Patient Name: [name] Date of Service: 03/22/2019 8:00 AM Medical Record Number: [id number] Patient Account Number: [id number] Date of Birth/Sex: Dec 24, 1980 (38 y.o. M) Treating RN: Harold Barban Primary Care Provider: [name] Other Clinician: Referring Provider: [name] Treating Provider/Extender: Melburn Hake, Lindalou Soltis Weeks in Treatment: 119 Information Obtained From Patient Constitutional Symptoms (General Health) Complaints and Symptoms: Negative for: Fatigue; Fever; Chills; Marked Weight Change Respiratory Complaints and Symptoms: Negative for: Chronic or frequent coughs; Shortness of Breath Medical History: Positive for: Sleep Apnea - cpap Negative for: Aspiration; Asthma; Chronic Obstructive Pulmonary Disease (COPD); Pneumothorax Cardiovascular Complaints and Symptoms: Negative for: Chest pain; LE edema Medical History: Positive for: Hypertension Negative for: Angina; Arrhythmia; Congestive Heart Failure; Coronary Artery Disease; Deep Vein Thrombosis; Hypotension; Myocardial Infarction; Peripheral Arterial Disease; Peripheral Venous Disease; Phlebitis; Vasculitis Psychiatric Complaints and Symptoms: Negative for: Anxiety; Claustrophobia Medical History: Negative for: Anorexia/bulimia; Confinement Anxiety Eyes Medical History: Negative for: Cataracts; Glaucoma;  Optic Neuritis Ear/Nose/Mouth/Throat Medical History: Negative for: Chronic sinus problems/congestion; Middle ear problems Hematologic/Lymphatic Medical History: Negative for: Anemia; Hemophilia; Human Immunodeficiency Virus; Lymphedema; Sickle Cell Disease Gibbs, Brahim ([id number]) Gastrointestinal Medical History: Positive for: Colitis Endocrine Medical History: Negative for: Type I Diabetes; Type II Diabetes Genitourinary Medical History: Negative for: End Stage Renal Disease Immunological Medical History: Negative for: Lupus Erythematosus; Raynaudos; Scleroderma Integumentary (Skin) Medical History: Negative for: History of Burn; History of pressure wounds Musculoskeletal Medical History: Negative for: Gout; Rheumatoid Arthritis; Osteoarthritis; Osteomyelitis Neurologic Medical History: Negative for:  Dementia; Neuropathy; Quadriplegia; Paraplegia; Seizure Disorder Oncologic Medical History: Negative for: Received Chemotherapy; Received Radiation Immunizations Pneumococcal Vaccine: Received Pneumococcal Vaccination: No Implantable Devices No devices added Family and Social History Cancer: No; Diabetes: Yes - Mother,Maternal Grandparents; Heart Disease: Yes - Mother,Maternal Grandparents; Hereditary Spherocytosis: Yes - Siblings; Hypertension: Yes - Maternal Grandparents; Kidney Disease: No; Lung Disease: No; Seizures: No; Stroke: Yes - Siblings; Thyroid Problems: No; Tuberculosis: No; Current some day smoker; Marital Status - Married; Alcohol Use: Rarely; Drug Use: No History; Caffeine Use: Moderate; Financial Concerns: No; Food, Clothing or Shelter Needs: No; Support System Lacking: No; Transportation Concerns: No Physician Affirmation I have reviewed and agree with the above information. Electronic Signature(s) Signed: 03/22/2019 4:25:43 PM By: Kathreen Cosier ([id number]) Signed: 03/24/2019 2:09:51 AM By: Worthy Keeler PA-C Entered By: Worthy Keeler on 03/22/2019 08:51:11 KATAI, MARSICO ([id number]) -------------------------------------------------------------------------------- SuperBill Details Patient Name: [name] Date of Service: 03/22/2019 Medical Record Number: [id number] Patient Account Number: [id number] Date of Birth/Sex: 1980/05/30 (38 y.o. M) Treating RN: Harold Barban Primary Care Provider: [name] Other Clinician: Referring Provider: [name] Treating Provider/Extender: Melburn Hake, De Libman Weeks in Treatment: 119 Diagnosis Coding ICD-10 Codes Code Description [phone] Non-pressure chronic ulcer of left calf with fat layer exposed L88 Pyoderma gangrenosum I87.2 Venous insufficiency (chronic) (peripheral) Facility Procedures CPT4 Code: 56861683 Description: 72902 - WOUND CARE VISIT-LEV 3 EST PT Modifier: Quantity: 1 Physician  Procedures CPT4 Code: 1115520 Description: 80223 - WC PHYS LEVEL 4 - EST PT ICD-10 Diagnosis Description L97.222 Non-pressure chronic ulcer of left calf with fat layer expo L88 Pyoderma gangrenosum I87.2 Venous insufficiency (chronic) (peripheral) Modifier: sed Quantity: 1 Electronic Signature(s) Signed: 03/22/2019 8:53:45 AM By: Worthy Keeler PA-C Entered By: Worthy Keeler on 03/22/2019 08:53:45

## 2019-03-29 DIAGNOSIS — K51919 Ulcerative colitis, unspecified with unspecified complications: Principal | ICD-10-CM

## 2019-03-29 DIAGNOSIS — L88 Pyoderma gangrenosum: Principal | ICD-10-CM

## 2019-03-29 NOTE — Unmapped (Signed)
Per test claim for Humira at the [facility], patient needs Medication Assistance Program for Prior Authorization.

## 2019-03-30 NOTE — Unmapped (Signed)
TC to pt to follow up on travel screen by A. Bell, LPN.  Pt confirms cough, sore throat, and fatigue x 3 days.  He has not had any confirmed close contacts with a covid 19 positive person, but his son has had covid like symptoms as well and is pending test results.  Pt has not had a test himself.  He declines [facility] referral due to his location stating preference to seek testing at [facility] to which he also has the hotline number. He is receptive to virtual visit in lieu of dermatology clinic visit.

## 2019-03-30 NOTE — Unmapped (Signed)
Pt agreeable to virtual visit with Dr. Carles Collet on Thursday 11/12.  Reviewed photo upload.

## 2019-03-30 NOTE — Unmapped (Signed)
Done. Also, Called patient to inform him that appt was switched to VV      Thanks  [facility]

## 2019-03-31 ENCOUNTER — Other Ambulatory Visit: Payer: Self-pay

## 2019-03-31 DIAGNOSIS — Z20822 Contact with and (suspected) exposure to covid-19: Secondary | ICD-10-CM

## 2019-04-01 ENCOUNTER — Telehealth
Admit: 2019-04-01 | Discharge: 2019-04-02 | Payer: PRIVATE HEALTH INSURANCE | Attending: Dermatology | Primary: Dermatology

## 2019-04-01 DIAGNOSIS — Z79899 Other long term (current) drug therapy: Principal | ICD-10-CM

## 2019-04-01 MED ORDER — PREDNISONE 10 MG TABLET
ORAL_TABLET | ORAL | 0 refills | 0.00000 days | Status: CP
Start: 2019-04-01 — End: ?

## 2019-04-01 NOTE — Unmapped (Signed)
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 Patient has a tendency to pick at certain areas (evidenced by lichenification) which exacerbates the process. Primary lesion may be initial papules/pustules of PG but over time he has also demonstrated some itching related to venous stasis dermatitis and possible folliculitis.  His lesions are likely multifactorial related to all of these issues..  - Discussed risk of developing new PG lesions in areas of trauma   - Discussed attempting to decrease to 100 mg daily of doxycycline given that he has decent control vs intermittent pulses. Has been on tetracyclines for many years at this point.  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 Hx of UC also likely related. . LL ulcer healing acutely worsening. Present for many years at this point  - Currently off of long-term high-dose prednisone use that was complicated by development of cushingoid features and development of diabetes mellitus, however provided 3 week taper in the setting of new acute worsening (wound care gave him a few days as well recently)  -Pt currently waiting for covid results- discussed risks while taking prednisone and cautious use of prednisone in this setting.  -May consider biopsy in the setting of chronic nonhealing ulcer if concern for malignancy  -Recommend starting humira 80 mg every other week given persistence for many years despite multiple treatments as below. Rx sent and was denied. Decreased to 40 mg every other week with approval. May need to increase his dose given his weight. -Given acute worsening with concern for involvement of muscle - also started process for obtaining approval for renflexis (previously denied) - this would also be preferred given his weight  - Continue follow-up with wound care  - Continue wearing compression stockings  -cont hibiclens  -cont hydrocortisone and TAC cream per patient preference  -avoid clobetasol given that he develops cellulitis easily under his compression  -consider clinda/rifampin  -consider aspirin or pentoxyfilline or horsechestnut seed extract  -consider dapsone gel  -patient following with GI and rheum  -in reserve: cellcept and methotrexate      [facility] - quant gold ordered  Discussed risks of infection, potential risk of malignancy, injection site reactions. No hx of CHF, MS, lupus, malignancy, hepatitis, TB        I spent 16 minutes on the real-time audio and video with the patient. I spent an additional 30 minutes on pre- and post-visit activities.     The patient was physically located in [place] or a state in which I am permitted to provide care. The patient and/or parent/guardian understood that s/he may incur co-pays and cost sharing, and agreed to the telemedicine visit. The visit was reasonable and appropriate under the circumstances given the patient's presentation at the time.    The patient and/or parent/guardian has been advised of the potential risks and limitations of this mode of treatment (including, but not limited to, the absence of in-person examination) and has agreed to be treated using telemedicine. The patient's/patient's family's questions regarding telemedicine have been answered.     If the visit was completed in an ambulatory setting, the patient and/or parent/guardian has also been advised to contact their provider???s office for worsening conditions, and  seek emergency medical treatment and/or call 911 if the patient deems either necessary.         RTC: 1 week    SUBJECTIVE    Chief Complaint Patient presents with   ??? Wound Check     left leg,  for 3 years, it is getting deeper and bigger   ??? consent     consent to VV and will be in [place] for this visit       HPI:  This is a pleasant 38 y.o. male with a history of pyoderma gangrenosum and MRSA infection who presents today for f/up. Last seen by myself  a few weeks ago. Currently seeing wound care. Present for many years. Had acute worsening in the past two weeks. States it is down to muscle. Awaiting covid testing as son recently tested positive. Not feeling well.      Disease history:  -Starting about 2013, he started to develop itchy bumps that enlarge when he scratches and picks at them. This initially affected just his lower legs, but more recently prior to initial presentation to our clinic, involvement spread to his thighs, buttocks, arms, and forearms.  -Areas start as tiny bumps that he then scratches. 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   -In spring 2018, an ulcer started on the L lateral calf and gradually enlarged, but was slowly healing and shrinking by the time of presentation to our clinic. Denies any trauma at this site and notes that it started similar to other rashes on skin. Prior treatments include oral abx, including as recently as 12/2016 with courses of clinda and doxy, as well as topical steroids. He has h/o ulcerative colitis, now in remission without meds since 2015. Denies h/o liver or kidney disease or DM. Used to live in [place] when this started. Works for [employer] and is on feet all day, endorses swelling of legs. Denies new meds prior to onset. - At his initial visit 01/22/2017, clobetasol ointment and doxycycline 100mg  bid x 4 weeks were started. A biopsy of a keratotic pruritic plaque was obtained. 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 Tissue culture grew 4+ MSSA. 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   - w/up for underlying pruritus (given patient report and difficulty not picking at the involved areas) on 10/17 revealed unremarkable HIV, hepatitis panel, LDH, TSH, free T4 . Outside slides from previous bx were reviewed by Dr. Eyvonne Left and showed venous stasis changes as well as a neutrophilic infiltrate that could be consistent with PG. He was started on topical clobetasol BID. He was continued on PO doxycycline for additional 6 weeks. His left leg ulcer continued to grow and the site of his previous biopsy on the posterior right leg has also developed a large non healing ulcer.   -10/18: w/up for underlying etiology of PG negative on 10/18: negative spep/upep, cbc w/diff consistently normal, and reports a known hx of ulcerative colitis which is in remssion  - Repeat biopsy as below done on 03/26/17 primarily c/w PG vs infectious with one potential organism on Fite stain but unclear if related.   - Patient started on prednisone on 04/01/17 with almost resolution by 7/19.  -Patient has recurrence of PG in the setting of MRSA - start process for humira however this was denied,   -started dapsone 50 mg on 9/19, increased to 100 mg on 9/19, increased to  200 mg 1/20  -05/2018: stopped dapsone in setting of SOB with improvement of this despite stable labs and normal methgb. Stopped doxepin and gabapentin given lack of improvement  -rx sent for humira  02/2019 given worsening. Process started for renflexis in event of failure      Pertinent PMH:   Ulcerative colitis, managed by [facility]  Join pain managed by [facility] Rheum  HTN  DM   Denies h/o liver, kidney abnormalities     ROS: A balance of 10 systems was reviewed and negative.     PE: General: Well-developed, well-nourished male in no acute distress, resting comfortably.  Neuro: Alert and oriented, answers questions appropriately.  Skin: Examination of face, neck, bilateral upper extremities, bilateral lower L leg was notable for:  L leg with ulcer w/o surrounding violaceous color  -All other areas examined were normal or had no significant findings.

## 2019-04-02 ENCOUNTER — Telehealth: Payer: Self-pay

## 2019-04-02 LAB — NOVEL CORONAVIRUS, NAA: SARS-CoV-2, NAA: DETECTED — AB

## 2019-04-02 NOTE — Telephone Encounter (Signed)
Results not available yet.

## 2019-04-03 ENCOUNTER — Encounter: Payer: Self-pay | Admitting: Family Medicine

## 2019-04-05 ENCOUNTER — Ambulatory Visit: Payer: 59 | Admitting: Physician Assistant

## 2019-04-06 ENCOUNTER — Encounter: Payer: Self-pay | Admitting: *Deleted

## 2019-04-06 DIAGNOSIS — K51919 Ulcerative colitis, unspecified with unspecified complications: Principal | ICD-10-CM

## 2019-04-06 DIAGNOSIS — L88 Pyoderma gangrenosum: Principal | ICD-10-CM

## 2019-04-07 ENCOUNTER — Encounter: Payer: Self-pay | Admitting: *Deleted

## 2019-04-08 ENCOUNTER — Telehealth
Admit: 2019-04-08 | Discharge: 2019-04-09 | Payer: PRIVATE HEALTH INSURANCE | Attending: Dermatology | Primary: Dermatology

## 2019-04-08 MED ORDER — INFLIXIMAB-ABDA (RENFLEXIS) IVPB IN 100 ML 0.9% NACL
Freq: Once | INTRAVENOUS | 6 refills | 0.00000 days | Status: CP
Start: 2019-04-08 — End: 2019-04-08

## 2019-04-08 MED ORDER — ADALIMUMAB 40 MG/0.8 ML SUBCUTANEOUS PEN KIT
SUBCUTANEOUS | 11 refills | 28.00000 days | Status: CP
Start: 2019-04-08 — End: 2020-04-07

## 2019-04-08 NOTE — Unmapped (Signed)
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 Patient has a tendency to pick at certain areas (evidenced by lichenification) which exacerbates the process. Primary lesion may be initial papules/pustules of PG but over time he has also demonstrated some itching related to venous stasis dermatitis and possible folliculitis.  His lesions are likely multifactorial related to all of these issues..  - Discussed risk of developing new PG lesions in areas of trauma   - Discussed attempting to decrease to 100 mg daily of doxycycline given that he has decent control vs intermittent pulses. Has been on tetracyclines for many years at this point.  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 Hx of UC also likely related. . LL ulcer healing acutely worsening. Present for many years at this point  - Currently off of long-term high-dose prednisone use that was complicated by development of cushingoid features and development of diabetes mellitus  -May consider biopsy in the setting of chronic nonhealing ulcer if concern for malignancy  -Recommend starting humira 80 mg every other week given persistence for many years despite multiple treatments as below. Rx sent and was denied. Decreased to 40 mg every other week with approval. May need to increase his dose given his weight.  -Given acute worsening with concern for involvement of muscle - also started process for obtaining approval for renflexis (previously denied) - this would also be preferred given his weight  - Continue follow-up with wound care  - Continue wearing compression stockings  -cont hibiclens -escalate from hydrocortisone and TAC cream per patient preference to clobetasol   -start white vinegar soaks  -consider clinda/rifampin  -consider aspirin or pentoxyfilline or horsechestnut seed extract  -consider dapsone gel  -patient following with GI and rheum  -in reserve: sski (normal baseline thyroid 1 year ago), cellcept, methotrexate      [facility] - quant gold ordered  Discussed risks of infection, potential risk of malignancy, injection site reactions. No hx of CHF, MS, lupus, malignancy, hepatitis, TB        I spent 13 minutes on the phone with the patient. I spent an additional 30 minutes on pre- and post-visit activities.     The patient was physically located in [place] or a state in which I am permitted to provide care. The patient and/or parent/guardian understood that s/he may incur co-pays and cost sharing, and agreed to the telemedicine visit. The visit was reasonable and appropriate under the circumstances given the patient's presentation at the time.    The patient and/or parent/guardian has been advised of the potential risks and limitations of this mode of treatment (including, but not limited to, the absence of in-person examination) and has agreed to be treated using telemedicine. The patient's/patient's family's questions regarding telemedicine have been answered.     If the visit was completed in an ambulatory setting, the patient and/or parent/guardian has also been advised to contact their provider???s office for worsening conditions, and seek emergency medical treatment and/or call 911 if the patient deems either necessary.         RTC: 2-3 weeks    SUBJECTIVE    Chief Complaint   Patient presents with   ??? Dermatitis  left lower leg about the same   ??? pt consented for virtual visit and will be in        HPI: This is a pleasant 38 y.o. male with a history of pyoderma gangrenosum and MRSA infection who presents today for f/up. Last seen by myself  a few weeks ago. Recently Covid positive and not feeling well. Continues to have acute worsening of ulcer on the L leg. Present for many years. Had acute worsening in the past two weeks. States it is down to muscle.    Disease history:  -Starting about 2013, he started to develop itchy bumps that enlarge when he scratches and picks at them. This initially affected just his lower legs, but more recently prior to initial presentation to our clinic, involvement spread to his thighs, buttocks, arms, and forearms.  -Areas start as tiny bumps that he then scratches. 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   -In spring 2018, an ulcer started on the L lateral calf and gradually enlarged, but was slowly healing and shrinking by the time of presentation to our clinic. Denies any trauma at this site and notes that it started similar to other rashes on skin. Prior treatments include oral abx, including as recently as 12/2016 with courses of clinda and doxy, as well as topical steroids. He has h/o ulcerative colitis, now in remission without meds since 2015. Denies h/o liver or kidney disease or DM. Used to live in [place] when this started. Works for [employer] and is on feet all day, endorses swelling of legs. Denies new meds prior to onset.  - At his initial visit 01/22/2017, clobetasol ointment and doxycycline 100mg  bid x 4 weeks were started. A biopsy of a keratotic pruritic plaque was obtained. 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 Tissue culture grew 4+ MSSA. 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 - w/up for underlying pruritus (given patient report and difficulty not picking at the involved areas) on 10/17 revealed unremarkable HIV, hepatitis panel, LDH, TSH, free T4 . Outside slides from previous bx were reviewed by Dr. Eyvonne Left and showed venous stasis changes as well as a neutrophilic infiltrate that could be consistent with PG. He was started on topical clobetasol BID. He was continued on PO doxycycline for additional 6 weeks. His left leg ulcer continued to grow and the site of his previous biopsy on the posterior right leg has also developed a large non healing ulcer.   -10/18: w/up for underlying etiology of PG negative on 10/18: negative spep/upep, cbc w/diff consistently normal, and reports a known hx of ulcerative colitis which is in remssion  - Repeat biopsy as below done on 03/26/17 primarily c/w PG vs infectious with one potential organism on Fite stain but unclear if related.   - Patient started on prednisone on 04/01/17 with almost resolution by 7/19.  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 Stopped doxepin and gabapentin given lack of improvement  -rx sent for humira  02/2019 given worsening. Process started for renflexis in event of failure      Pertinent PMH:   Ulcerative colitis, managed  by [facility]  Join pain managed by [facility]  HTN  DM   Denies h/o liver, kidney abnormalities     ROS: A balance of 10 systems was reviewed and negative.     PE: unable to examine

## 2019-04-19 ENCOUNTER — Other Ambulatory Visit: Payer: Self-pay

## 2019-04-19 ENCOUNTER — Encounter: Payer: 59 | Admitting: Physician Assistant

## 2019-04-19 DIAGNOSIS — F17200 Nicotine dependence, unspecified, uncomplicated: Secondary | ICD-10-CM | POA: Diagnosis not present

## 2019-04-21 ENCOUNTER — Other Ambulatory Visit: Payer: Self-pay

## 2019-04-21 DIAGNOSIS — Z20822 Contact with and (suspected) exposure to covid-19: Secondary | ICD-10-CM

## 2019-04-21 LAB — QUANTIFERON TB GOLD PLUS
QUANTIFERON MITOGEN VALUE: 9.19 [IU]/mL
QUANTIFERON TB1 AG VALUE: 0.02 [IU]/mL
QUANTIFERON TB2 AG VALUE: 0.01 [IU]/mL

## 2019-04-21 LAB — QUANTIFERON TB1 AG VALUE
Mycobacterium tuberculosis stimulated gamma interferon release by CD4+ T-cells^^corrected for background:ACnc:Pt:Bld:Qn:: 0.02

## 2019-04-21 NOTE — Progress Notes (Signed)
[name], [name] ([id number]) Visit Report for 04/19/2019 Arrival Information Details Patient Name: [name], [name] Date of Service: 04/19/2019 8:15 AM Medical Record Number: [id number] Patient Account Number: [id number] Date of Birth/Sex: August 21, 1980 (38 y.o. M) Treating RN: Army Melia Primary Care Albie Bazin: [name] Other Clinician: Referring Oaklee Sunga: [name] Treating Aaryanna Hyden/Extender: Melburn Hake, [name]: 80 Visit Information History Since Last Visit Added or deleted any medications: No Patient Arrived: Ambulatory Any new allergies or adverse reactions: No Arrival Time: 08:17 Had a fall or experienced change in No Accompanied By: self activities of daily living that may affect Transfer Assistance: None risk of falls: Patient Identification Verified: Yes Signs or symptoms of abuse/neglect since last visito No Patient Requires Transmission-Based No Hospitalized since last visit: No Precautions: Has Dressing in Place as Prescribed: Yes Patient Has Alerts: No Pain Present Now: No Electronic Signature(s) Signed: 04/19/2019 12:57:44 PM By: Army Melia Entered By: Army Melia on 04/19/2019 08:17:47 Mosquera, [name] ([id number]) -------------------------------------------------------------------------------- Clinic Level of Care Assessment Details Patient Name: [name] Date of Service: 04/19/2019 8:15 AM Medical Record Number: [id number] Patient Account Number: [id number] Date of Birth/Sex: 01-29-1981 (38 y.o. M) Treating RN: Harold Barban Primary Care Deena Shaub: [name] Other Clinician: Referring Carlyne Keehan: [name] Treating Aitan Rossbach/Extender: Melburn Hake, [name]: 27 Clinic Level of Care Assessment Items TOOL 4 Quantity Score []  - Use when only an EandM is performed on FOLLOW-UP visit 0 ASSESSMENTS - Nursing Assessment / Reassessment X - Reassessment of Co-morbidities (includes updates in patient status) 1  10 X- 1 5 Reassessment of Adherence to Treatment Plan ASSESSMENTS - Wound and Skin Assessment / Reassessment X - Simple Wound Assessment / Reassessment - one wound 1 5 []  - 0 Complex Wound Assessment / Reassessment - multiple wounds []  - 0 Dermatologic / Skin Assessment (not related to wound area) ASSESSMENTS - Focused Assessment []  - Circumferential Edema Measurements - multi extremities 0 []  - 0 Nutritional Assessment / Counseling / Intervention []  - 0 Lower Extremity Assessment (monofilament, tuning fork, pulses) []  - 0 Peripheral Arterial Disease Assessment (using hand held doppler) ASSESSMENTS - Ostomy and/or Continence Assessment and Care []  - Incontinence Assessment and Management 0 []  - 0 Ostomy Care Assessment and Management (repouching, etc.) PROCESS - Coordination of Care X - Simple Patient / Family Education for ongoing care 1 15 []  - 0 Complex (extensive) Patient / Family Education for ongoing care []  - 0 Staff obtains Programmer, systems, Records, Test Results / Process Orders []  - 0 Staff telephones HHA, Nursing Homes / Clarify orders / etc []  - 0 Routine Transfer to another Facility (non-emergent condition) []  - 0 Routine Hospital Admission (non-emergent condition) []  - 0 New Admissions / Biomedical engineer / Ordering NPWT, Apligraf, etc. []  - 0 Emergency Hospital Admission (emergent condition) X- 1 10 Simple Discharge Coordination MARQUIES, WANAT ([id number]) []  - 0 Complex (extensive) Discharge Coordination PROCESS - Special Needs []  - Pediatric / Minor Patient Management 0 []  - 0 Isolation Patient Management []  - 0 Hearing / Language / Visual special needs []  - 0 Assessment of Community assistance (transportation, D/C planning, etc.) []  - 0 Additional assistance / Altered mentation []  - 0 Support Surface(s) Assessment (bed, cushion, seat, etc.) INTERVENTIONS - Wound Cleansing / Measurement X - Simple Wound Cleansing - one wound 1 5 []  - 0 Complex  Wound Cleansing - multiple wounds X- 1 5 Wound Imaging (photographs - any number of wounds) []  - 0 Wound Tracing (instead of photographs) X- 1 5 Simple Wound Measurement -  one wound []  - 0 Complex Wound Measurement - multiple wounds INTERVENTIONS - Wound Dressings X - Small Wound Dressing one or multiple wounds 1 10 []  - 0 Medium Wound Dressing one or multiple wounds []  - 0 Large Wound Dressing one or multiple wounds X- 1 5 Application of Medications - topical []  - 0 Application of Medications - injection INTERVENTIONS - Miscellaneous []  - External ear exam 0 []  - 0 Specimen Collection (cultures, biopsies, blood, body fluids, etc.) []  - 0 Specimen(s) / Culture(s) sent or taken to Lab for analysis []  - 0 Patient Transfer (multiple staff / Civil Service fast streamer / Similar devices) []  - 0 Simple Staple / Suture removal (25 or less) []  - 0 Complex Staple / Suture removal (26 or more) []  - 0 Hypo / Hyperglycemic Management (close monitor of Blood Glucose) []  - 0 Ankle / Brachial Index (ABI) - do not check if billed separately X- 1 5 Vital Signs Pasha, Thierno ([id number]) Has the patient been seen at the hospital within the last three years: Yes Total Score: 80 Level Of Care: New/Established - Level 3 Electronic Signature(s) Signed: 04/21/2019 4:10:47 PM By: Harold Barban Entered By: Harold Barban on 04/19/2019 08:33:00 [name] ([id number]) -------------------------------------------------------------------------------- Encounter Discharge Information Details Patient Name: [name] Date of Service: 04/19/2019 8:15 AM Medical Record Number: [id number] Patient Account Number: [id number] Date of Birth/Sex: 1980/07/12 (38 y.o. M) Treating RN: Harold Barban Primary Care Maycen Degregory: [name] Other Clinician: Referring Halen Mossbarger: [name] Treating Tzivia Oneil/Extender: Melburn Hake, [name]: 44 Encounter Discharge Information Items Discharge  Condition: Stable Ambulatory Status: Ambulatory Discharge Destination: Home Transportation: Private Auto Accompanied By: self Schedule Follow-up Appointment: Yes Clinical Summary of Care: Electronic Signature(s) Signed: 04/21/2019 4:10:47 PM By: Harold Barban Entered By: Harold Barban on 04/19/2019 08:34:36 [name] ([id number]) -------------------------------------------------------------------------------- Lower Extremity Assessment Details Patient Name: [name] Date of Service: 04/19/2019 8:15 AM Medical Record Number: [id number] Patient Account Number: [id number] Date of Birth/Sex: 1980-08-05 (38 y.o. M) Treating RN: Army Melia Primary Care Oluwanifemi Susman: [name] Other Clinician: Referring Aubery Date: [name] Treating Jennye Runquist/Extender: STONE III, [name]: 123 Edema Assessment Assessed: [Left: No] [Right: No] Edema: [Left: N] [Right: o] Vascular Assessment Pulses: Dorsalis Pedis Palpable: [Left:Yes] Electronic Signature(s) Signed: 04/19/2019 12:57:44 PM By: Army Melia Entered By: Army Melia on 04/19/2019 08:22:01 Goffredo, [name] ([id number]) -------------------------------------------------------------------------------- Multi Wound Chart Details Patient Name: [name] Date of Service: 04/19/2019 8:15 AM Medical Record Number: [id number] Patient Account Number: [id number] Date of Birth/Sex: 07/11/1980 (38 y.o. M) Treating RN: Harold Barban Primary Care Daeton Kluth: [name] Other Clinician: Referring Ameliarose Shark: [name] Treating Treniya Lobb/Extender: Melburn Hake, [name]: 123 Vital Signs Height(in): 71 Pulse(bpm): 72 Weight(lbs): 338 Blood Pressure(mmHg): 149/81 Body Mass Index(BMI): 47 Temperature(F): 98.1 Respiratory Rate 16 (breaths/min): Photos: [N/A:N/A] Wound Location: Left Lower Leg - Lateral N/A N/A Wounding Event: Gradually Appeared N/A N/A Primary Etiology: Pyoderma N/A  N/A Comorbid History: Sleep Apnea, Hypertension, N/A N/A Colitis Date Acquired: 11/18/2015 N/A N/A Weeks of Treatment: 123 N/A N/A Wound Status: Open N/A N/A Measurements L x W x D 4.9x3.5x0.4 N/A N/A (cm) Area (cm) : 13.47 N/A N/A Volume (cm) : 5.388 N/A N/A % Reduction in Area: -174.40% N/A N/A % Reduction in Volume: -37.20% N/A N/A Classification: Full Thickness With Exposed N/A N/A Support Structures Exudate Amount: Medium N/A N/A Exudate Type: Serous N/A N/A Exudate Color: amber N/A N/A Wound Margin: Distinct, outline attached N/A N/A Granulation Amount: Large (67-100%) N/A N/A Granulation Quality: Red N/A  N/A Necrotic Amount: Small (1-33%) N/A N/A Exposed Structures: Fat Layer (Subcutaneous N/A N/A Tissue) Exposed: Yes Fascia: No Tendon: No Muscle: No Peraza, [name] ([id number]) Joint: No Bone: No Epithelialization: None N/A N/A Treatment Notes Electronic Signature(s) Signed: 04/21/2019 4:10:47 PM By: Harold Barban Entered By: Harold Barban on 04/19/2019 08:31:49 [name] ([id number]) -------------------------------------------------------------------------------- Multi-Disciplinary Care Plan Details Patient Name: [name] Date of Service: 04/19/2019 8:15 AM Medical Record Number: [id number] Patient Account Number: [id number] Date of Birth/Sex: [birth year] (39 y.o. M) Treating RN: Harold Barban Primary Care Tewana Bohlen: [name] Other Clinician: Referring Romaine Maciolek: [name] Treating Ayannah Faddis/Extender: Melburn Hake, [name]: 71 Active Inactive Orientation to the Wound Care Program Nursing Diagnoses: Knowledge deficit related to the wound healing center program Goals: Patient/caregiver will verbalize understanding of the Learned Program Date Initiated: 12/11/2016 Target Resolution Date: 03/13/2017 Goal Status: Active Interventions: Provide education on orientation to the wound center Notes: Venous Leg  Ulcer Nursing Diagnoses: Knowledge deficit related to disease process and management Potential for venous Insuffiency (use before diagnosis confirmed) Goals: Non-invasive venous studies are completed as ordered Date Initiated: 12/11/2016 Target Resolution Date: 03/13/2017 Goal Status: Active Patient will maintain optimal edema control Date Initiated: 12/11/2016 Target Resolution Date: 03/13/2017 Goal Status: Active Patient/caregiver will verbalize understanding of disease process and disease management Date Initiated: 12/11/2016 Target Resolution Date: 03/13/2017 Goal Status: Active Verify adequate tissue perfusion prior to therapeutic compression application Date Initiated: 12/11/2016 Target Resolution Date: 03/13/2017 Goal Status: Active Interventions: Assess peripheral edema status every visit. Compression as ordered Provide education on venous insufficiency Treatment Activities: BRYNN, REZNIK ([id number]) Therapeutic compression applied : 12/11/2016 Notes: Wound/Skin Impairment Nursing Diagnoses: Impaired tissue integrity Knowledge deficit related to ulceration/compromised skin integrity Goals: Patient/caregiver will verbalize understanding of skin care regimen Date Initiated: 12/11/2016 Target Resolution Date: 03/13/2017 Goal Status: Active Ulcer/skin breakdown will have a volume reduction of 30% by week 4 Date Initiated: 12/11/2016 Target Resolution Date: 03/13/2017 Goal Status: Active Ulcer/skin breakdown will have a volume reduction of 50% by week 8 Date Initiated: 12/11/2016 Target Resolution Date: 03/13/2017 Goal Status: Active Ulcer/skin breakdown will have a volume reduction of 80% by week 12 Date Initiated: 12/11/2016 Target Resolution Date: 03/13/2017 Goal Status: Active Ulcer/skin breakdown will heal within 14 weeks Date Initiated: 12/11/2016 Target Resolution Date: 03/13/2017 Goal Status: Active Interventions: Assess patient/caregiver ability to obtain  necessary supplies Assess patient/caregiver ability to perform ulcer/skin care regimen upon admission and as needed Assess ulceration(s) every visit Provide education on ulcer and skin care Treatment Activities: Skin care regimen initiated : 12/11/2016 Topical wound management initiated : 12/11/2016 Notes: Electronic Signature(s) Signed: 04/21/2019 4:10:47 PM By: Harold Barban Entered By: Harold Barban on 04/19/2019 08:31:34 [name] ([id number]) -------------------------------------------------------------------------------- Pain Assessment Details Patient Name: [name] Date of Service: 04/19/2019 8:15 AM Medical Record Number: [id number] Patient Account Number: [id number] Date of Birth/Sex: 1980-06-13 (38 y.o. M) Treating RN: Army Melia Primary Care Isma Tietje: [name] Other Clinician: Referring Manraj Yeo: [name] Treating Maxine Fredman/Extender: Melburn Hake, [name]: 123 Active Problems Location of Pain Severity and Description of Pain Patient Has Paino No Site Locations Pain Management and Medication Current Pain Management: Electronic Signature(s) Signed: 04/19/2019 12:57:44 PM By: Army Melia Entered By: Army Melia on 04/19/2019 08:17:57 Clear, [name] ([id number]) -------------------------------------------------------------------------------- Patient/Caregiver Education Details Patient Name: [name] Date of Service: 04/19/2019 8:15 AM Medical Record Number: [id number] Patient Account Number: [id number] Date of Birth/Gender: 10-05-1980 (38 y.o. M) Treating RN: Harold Barban Primary Care Physician: [name] Other Clinician: Referring Physician:  [name] Treating Physician/Extender: Sharalyn Ink in Treatment: 123 Education Assessment Education Provided To: Patient Education Topics Provided Venous: Handouts: Controlling Swelling with Compression Stockings Methods: Demonstration,  Explain/Verbal Responses: State content correctly Wound/Skin Impairment: Handouts: Caring for Your Ulcer Methods: Demonstration, Explain/Verbal Responses: State content correctly Electronic Signature(s) Signed: 04/21/2019 4:10:47 PM By: Harold Barban Entered By: Harold Barban on 04/19/2019 08:32:12 [name] ([id number]) -------------------------------------------------------------------------------- Wound Assessment Details Patient Name: [name] Date of Service: 04/19/2019 8:15 AM Medical Record Number: [id number] Patient Account Number: [id number] Date of Birth/Sex: 03/24/1981 (38 y.o. M) Treating RN: Army Melia Primary Care Nielle Duford: [name] Other Clinician: Referring Agron Swiney: [name] Treating Haileyann Staiger/Extender: Melburn Hake, [name]: 123 Wound Status Wound Number: 1 Primary Etiology: Pyoderma Wound Location: Left Lower Leg - Lateral Wound Status: Open Wounding Event: Gradually Appeared Comorbid History: Sleep Apnea, Hypertension, Colitis Date Acquired: 11/18/2015 Weeks Of Treatment: 123 Clustered Wound: No Photos Wound Measurements Length: (cm) 4.9 Width: (cm) 3.5 Depth: (cm) 0.4 Area: (cm) 13.47 Volume: (cm) 5.388 % Reduction in Area: -174.4% % Reduction in Volume: -37.2% Epithelialization: None Tunneling: No Undermining: No Wound Description Full Thickness With Exposed Support Foul Odor Classification: Structures Slough/Fib Wound Margin: Distinct, outline attached Exudate Medium Amount: Exudate Type: Serous Exudate Color: amber After Cleansing: No rino Yes Wound Bed Granulation Amount: Large (67-100%) Exposed Structure Granulation Quality: Red Fascia Exposed: No Necrotic Amount: Small (1-33%) Fat Layer (Subcutaneous Tissue) Exposed: Yes Necrotic Quality: Adherent Slough Tendon Exposed: No Muscle Exposed: No Joint Exposed: No Bone Exposed: No Derstine, Juvencio ([id number]) Treatment Notes Wound #1 (Left,  Lateral Lower Leg) Notes TCA to wound bed only, BFD Electronic Signature(s) Signed: 04/19/2019 12:57:44 PM By: Army Melia Entered By: Army Melia on 04/19/2019 08:21:13 [name] ([id number]) -------------------------------------------------------------------------------- Vitals Details Patient Name: [name] Date of Service: 04/19/2019 8:15 AM Medical Record Number: [id number] Patient Account Number: [id number] Date of Birth/Sex: 09-28-1980 (38 y.o. M) Treating RN: Army Melia Primary Care Leonela Kivi: [name] Other Clinician: Referring Braeton Wolgamott: [name] Treating Estelle Greenleaf/Extender: Melburn Hake, [name]: 123 Vital Signs Time Taken: 08:18 Temperature (F): 98.1 Height (in): 71 Pulse (bpm): 72 Weight (lbs): 338 Respiratory Rate (breaths/min): 16 Body Mass Index (BMI): 47.1 Blood Pressure (mmHg): 149/81 Reference Range: 80 - 120 mg / dl Electronic Signature(s) Signed: 04/19/2019 12:57:44 PM By: Army Melia Entered By: Army Melia on 04/19/2019 08:18:17

## 2019-04-21 NOTE — Progress Notes (Signed)
[name], [name] ([id number]) Visit Report for 04/19/2019 Chief Complaint Document Details Patient Name: [name], [name] Date of Service: 04/19/2019 8:15 AM Medical Record Number: [id number] Patient Account Number: [id number] Date of Birth/Sex: 01/17/1981 (38 y.o. M) Treating RN: Harold Barban Primary Care Provider: [name] Other Clinician: Referring Provider: [name] Treating Provider/Extender: Melburn Hake, Delainy Mcelhiney Weeks in Treatment: 15 Information Obtained from: Patient Chief Complaint He is here in follow up evaluation for LLE pyoderma ulcer Electronic Signature(s) Signed: 04/20/2019 5:47:36 PM By: Worthy Keeler PA-C Entered By: Worthy Keeler on 04/19/2019 08:10:43 Mester, [name] ([id number]) -------------------------------------------------------------------------------- HPI Details Patient Name: [name] Date of Service: 04/19/2019 8:15 AM Medical Record Number: [id number] Patient Account Number: [id number] Date of Birth/Sex: 1981-04-30 (38 y.o. M) Treating RN: Harold Barban Primary Care Provider: [name] Other Clinician: Referring Provider: [name] Treating Provider/Extender: Melburn Hake, Telina Kleckley Weeks in Treatment: 30 History of Present Illness HPI Description: 12/04/16; 38 year old man who comes into the clinic today for review of a wound on the posterior left calf. He tells me that is been there for about a year. He is not a diabetic he does smoke half a pack per day. He was seen in the ER on 11/20/16 felt to have cellulitis around the wound and was given clindamycin. An x-ray did not show osteomyelitis. The patient initially tells me that he has a milk allergy that sets off a pruritic itching rash on his lower legs which she scratches incessantly and he thinks that's what may have set up the wound. He has been using various topical antibiotics and ointments without any effect. He works in a [employer] and is on his feet all day. He does not  have a prior history of wounds however he does have the rash on both lower legs the right arm and the ventral aspect of his left arm. These are excoriations and clearly have had scratching however there are of macular looking areas on both legs including a substantial larger area on the right leg. This does not have an underlying open area. There is no blistering. The patient tells me that 2 years ago in [place] in response to the rash on his legs he saw a dermatologist who told him he had a condition which may be pyoderma gangrenosum although I may be putting words into his mouth. He seemed to recognize this. 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 He is not in any treatment for this. He's had no recent travel 12/11/16; the patient arrives today with his wound and roughly the same condition we've been using silver alginate this is a deep punched out wound with some surrounding erythema but no tenderness. Biopsy I did did not show confirmed pyoderma gangrenosum suggested nonspecific inflammation and vasculitis but does not provide an actual description of what was seen by the pathologist. I'm really not able to understand this We have also received information from the patient's dermatologist in [place] notes from April 2016. This was a doctor Agarwal- antal. 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 He was prescribed topical steroid high potency under occlusion which helped but at this point the patient did not have a deep punched out wound. 12/18/16; the patient's wound is larger in terms of surface area however this surface looks better and there is less depth. The surrounding erythema also is better. The patient states that the wrap we put on came off 2 days ago when he has been using  his compression stockings. He we are in the process of getting a dermatology consult. 12/26/16 on evaluation today patient's left lower extremity wound shows  evidence of infection with surrounding erythema noted. He has been tolerating the dressing changes but states that he has noted more discomfort. There is a larger area of erythema surrounding the wound. No fevers, chills, nausea, or vomiting noted at this time. With that being said the wound still does have slough covering the surface. He is not allergic to any medication that he is aware of at this point. In regard to his right lower extremity he had several regions that are erythematous and pruritic he wonders if there's anything we can do to help that. 01/02/17 I reviewed patient's wound culture which was obtained his visit last week. He was placed on doxycycline at that point. Unfortunately that does not appear to be an antibiotic that would likely help with the situation however the pseudomonas noted on culture is sensitive to Cipro. Also unfortunately patient's wound seems to have a large compared to last week's evaluation. Not severely so but there are definitely increased measurements in general. He is continuing to have discomfort as well he writes this to be a seven out of 10. In fact he would prefer me not to perform any debridement today due to the fact that he is having discomfort and considering he has an active infection on the little reluctant to do so anyway. No fevers, chills, nausea, or vomiting noted at this time. 01/08/17; patient seems dermatology on September 5. I suspect dermatology will want the slides from the biopsy I did sent to their pathologist. I'm not sure if there is a way we can expedite that. In any case the culture I did before I left on vacation 3 weeks ago showed Pseudomonas he was given 10 days of Cipro and per her description of her intake nurses is actually somewhat better this week although the wound is quite a bit bigger than I remember the last time I saw this. He still has 3 more days of Cipro [name], [name] ([id number]) 01/21/17; dermatology appointment  tomorrow. He has completed the ciprofloxacin for Pseudomonas. Surface of the wound looks better however he is had some deterioration in the lesions on his right leg. Meantime the left lateral leg wound we will continue with sample 01/29/17; patient had his dermatology appointment but I can't yet see that note. He is completed his antibiotics. The wound is more superficial but considerably larger in circumferential area than when he came in. This is in his left lateral calf. He also has swollen erythematous areas with superficial wounds on the right leg and small papular areas on both arms. There apparently areas in her his upper thighs and buttocks I did not look at those. Dermatology biopsied the right leg. Hopefully will have their input next week. 02/05/17; patient went back to see his dermatologist who told him that he had a "scratching problem" as well as staph. He is now on a 30 day course of doxycycline and I believe she gave him triamcinolone cream to the right leg areas to help with the itching [not exactly sure but probably triamcinolone]. She apparently looked at the left lateral leg wound although this was not rebiopsied and I think felt to be ultimately part of the same pathogenesis. He is using sample border foam and changing nevus himself. He now has a new open area on the right posterior leg which was his biopsy site I don't  have any of the dermatology notes 02/12/17; we put the patient in compression last week with SANTYL to the wound on the left leg and the biopsy. Edema is much better and the depth of the wound is now at level of skin. Area is still the same oBiopsy site on the right lateral leg we've also been using santyl with a border foam dressing and he is changing this himself. 02/19/17; Using silver alginate started last week to both the substantial left leg wound and the biopsy site on the right wound. He is tolerating compression well. Has a an appointment with his primary  M.D. tomorrow wondering about diuretics although I'm wondering if the edema problem is actually lymphedema 02/26/17; the patient has been to see his primary doctor Dr. Jerrel Ivory at [facility] our primary care. She started him on Lasix 20 mg and this seems to have helped with the edema. However we are not making substantial change with the left lateral calf wound and inflammation. The biopsy site on the right leg also looks stable but not really all that different. 03/12/17; the patient has been to see vein and vascular Dr. Lucky Cowboy. He has had venous reflux studies I have not reviewed these. I did get a call from his dermatology office. 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 They therefore recommended clobetasol and tetracycline i.e. 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 03/26/17; apparently the patient just had reflux studies not an appointment with Dr. dew. She arrives in clinic today having applied clobetasol for 2-3 weeks. He notes over the last 2-3 days excessive drainage having to change the dressing 3-4 times a day and also expanding erythema. 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 Lillia Carmel at [facility] dermatology locally who kindly saw him at my request. 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 My concern was the degree of erythema medially extending from his left leg wound which was either  inflammation from pyoderma or cellulitis. I put him on Augmentin however culture of the wound showed Pseudomonas which is quinolone sensitive. I really don't believe he has cellulitis however in view of everything I will continue and give him a course of Cipro. He is also on doxycycline as an immune modulator for the pyoderma. In addition to his original wound on the left lateral leg with surrounding erythema he has a wound on the right posterior calf which was an original biopsy site done by dermatology. 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 Saw Dr. Lillia Carmel yesterday. He has been using topical antibiotics to both wound areas his original wound on the left and the biopsies/pathergy area on the right. There is definitely some improvement in the inflammation around the wound on the right although the patient states he has increasing sensitivity of the wounds. He is on prednisone 60 and doxycycline 1 as prescribed by Dr. Lillia Carmel. He is covering the topical  antibiotic with gauze and putting this in his own compression stocks and changing this daily. He states that Dr. Lottie Rater did a culture of the left leg wound yesterday 05/07/17; pyoderma gangrenosum. The patient saw Dr. Lillia Carmel yesterday and has a follow-up with her in one month. He is still using topical antibiotics to both wounds although he can't recall exactly what type. He is still on prednisone 60 mg. Dr. Lillia Carmel stated that the doxycycline could stop if we were in agreement. 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 The right medial leg was induced by biopsy/pathergy. The area on the right is essentially healed. 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 The right medial leg has  closed and remains closed. He is still on prednisone 60. oHe tells me he missed his last dermatology appointment with Dr. Lillia Carmel but will make another appointment. He reports that her blood sugar at a recent screen in [place] was high 200's. He was 180 today. He is more cushingoid blood pressure is [name], [name] ([id number]) up a bit. I think he is going to require still much longer prednisone perhaps another 3 months before attempting to taper. In the meantime his wound is a lot better. Smaller. He is cleaning this off daily and applying topical antibiotics. When he was last in the clinic I thought about changing to [facility] and actually put in a couple of calls to dermatology although probably not during their business hours. 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 He continues on oral prednisone. He has been using triple antibiotic ointment. There is surface debris and we will transition to [facility] and have him return in 2 weeks. He has lost 30 pounds since his last appointment with lifestyle modification. He may benefit from topical steroid cream for treatment this can be considered at a later date. 08/22/17 on evaluation today patient appears to actually be doing rather well in regard to his left lateral lower extremity ulcer. He has actually been managed by Dr. Dellia Nims most recently. Patient is currently on oral steroids at this time. This seems to have been of benefit for him. Nonetheless his last visit was actually with Leah on 08/06/17. Currently he is not utilizing any topical steroid creams although this could be of benefit as well. No fevers, chills, nausea, or vomiting noted at this time. 09/05/17 on evaluation today patient appears to be doing better in regard to his left lateral lower extremity ulcer. He has been tolerating the dressing changes without  complication. He is using Santyl with good effect. Overall I'm very pleased with how things are standing at this point. Patient likewise is happy that this is doing better. 09/19/17 on evaluation today patient actually appears to be doing rather well in regard to his left lateral lower extremity ulcer. 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 He's not having any significant pain. 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 He has been tolerating the Santyl without complication and in general I feel like he's making good  progress. 10/17/17 on evaluation today patient appears to be doing very well in regard to his left lateral lower surety ulcer. He has been tolerating the dressing changes without complication. There does not appear to be any evidence of infection he's alternating the Santyl and the triple antibiotic ointment every other day this seems to be doing well for him. 11/03/17 on evaluation today patient appears to be doing very well in regard to his left lateral lower extremity ulcer. He is been tolerating the dressing changes without complication which is good news. Fortunately there does not appear to be any evidence of infection which is also great news. Overall is doing excellent they are starting to taper down on the prednisone is down to 40 mg at this point it also started topical clobetasol for him. 11/17/17 on evaluation today patient appears to be doing well in regard to his left lateral lower surety ulcer. He's been tolerating the dressing changes without complication. He does note that he is having no pain, no excessive drainage or discharge, and overall he feels like things are going about how he would expect and hope they would. Overall he seems to have no evidence of infection at this time in my  opinion which is good news. 12/04/17-He is seen in follow-up evaluation for right lateral lower extremity ulcer. He has been applying topical steroid cream. Today's measurement show slight increase in size. Over the next 2 weeks we will transition to every other day Santyl and steroid cream. He has been encouraged to monitor for changes and notify clinic with any concerns 12/15/17 on evaluation today patient's left lateral motion the ulcer and fortunately is doing worse again at this point. This just since last week to this week has close to doubled in size according to the patient. I did not seeing last week's I do not have a visual to compare this to in our system was also down so we do not have all the charts and at this point. Nonetheless it does have me somewhat concerned in regard to the fact that again he was worried enough about it he has contact the dermatology that placed them back on the full strength, 50 mg a day of the prednisone that he was taken previous. He continues to alternate using clobetasol along with Santyl at this point. He is obviously somewhat frustrated. 12/22/17 on evaluation today patient appears to be doing a little worse compared to last evaluation. Unfortunately the wound is a little deeper and slightly larger than the last week's evaluation. With that being said he has made some progress in regard to the irritation surrounding at this time unfortunately despite that progress that's been made he still has a significant issue going on here. 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 For that reason I discussed with him today the possibility of perform a wound culture to ensure there's nothing overtly infected. 01/06/18 on evaluation today patient's wound is larger and deeper than previously evaluated. With that being said it  did appear that his wound was infected after my last evaluation with him. Subsequently I did end up prescribing a prescription for Bactrim DS which she has been taking and having no complication with. Fortunately there does not appear to be any evidence of Jalloh, Coury ([id number]) infection at this point in time as far as anything spreading, no want to  touch, and overall I feel like things are showing signs of improvement. 01/13/18 on evaluation today patient appears to be even a little larger and deeper than last time. There still muscle exposed in the base of the wound. Nonetheless he does appear to be less erythematous I do believe inflammation is calming down also believe the infection looks like it's probably resolved at this time based on what I'm seeing. No fevers, chills, nausea, or vomiting noted at this time. 01/30/18 on evaluation today patient actually appears to visually look better for the most part. Unfortunately those visually this looks better he does seem to potentially have what may be an abscess in the muscle that has been noted in the central portion of the wound. This is the first time that I have noted what appears to be fluctuance in the central portion of the muscle. With that being said I'm somewhat more concerned about the fact that this might indicate an abscess formation at this location. I do believe that an ultrasound would be appropriate. This is likely something we need to try to do as soon as possible. He has been switch to mupirocin ointment and he is no longer using the steroid ointment as prescribed by dermatology he sees them again next week he's been decreased from 60 to 40 mg of prednisone. 03/09/18 on evaluation today patient actually appears to be doing a little better compared to last time I saw him. There's not as much erythema surrounding the wound itself. He I did review his most recent infectious disease note which was dated 02/24/18. He saw Dr. Michel Bickers in [place]. 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 Patient is now off of antibiotics and they are continually observing this. There seems to be no change in the past two weeks in my pinion based on what the patient says and what I see today compared to what Dr. Megan Salon likely saw two weeks ago. No fevers, chills, nausea, or vomiting noted at this time. 03/23/18 on evaluation today patient's wound actually appears to be showing signs of improvement which is good news. He is currently still on the Dapsone. He is also working on tapering the prednisone to get off of this and Dr. Lottie Rater is working with him in this regard. Nonetheless overall I feel like the wound is doing well it does appear based on the infectious disease note that I reviewed from Dr. 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 I did also review the results of his ultrasound of left lower extremity which revealed there was a dentist tissue in the base of the wound without an abscess noted. 04/06/18 on evaluation today the patient's left lateral lower extremity ulcer actually appears to be doing fairly well which is excellent news. There does not appear to be any evidence of infection at this time which is also great news. Overall he still does have a significantly large ulceration although little by little he seems to be making progress. He is down to 10 mg a day of the prednisone. 04/20/18 on evaluation today patient actually appears to be doing excellent at this time in regard to his left lower extremity ulcer. He's making signs of good progress unfortunately this is taking much longer than we would really like  to see but nonetheless he is making progress. Fortunately there does not appear to be any evidence of  infection at this time. No fevers, chills, nausea, or vomiting noted at this time. The patient has not been using the Santyl due to the cost he hadn't got in this field yet. He's mainly been using the antibiotic ointment topically. Subsequently he also tells me that he really has not been scrubbing in the shower I think this would be helpful again as I told him it doesn't have to be anything too aggressive to even make it believe just enough to keep it free of some of the loose slough and biofilm on the wound surface. 05/11/18 on evaluation today patient's wound appears to be making slow but sure progress in regard to the left lateral lower extremity ulcer. He is been tolerating the dressing changes without complication. Fortunately there does not appear to be any evidence of infection at this time. He is still just using triple antibiotic ointment along with clobetasol occasionally over the area. He never got the Santyl and really does not seem to intend to in my pinion. 06/01/18 on evaluation today patient appears to be doing a little better in regard to his left lateral lower extremity ulcer. He states that overall he does not feel like he is doing as well with the Dapsone as he did with the prednisone. Nonetheless he sees his dermatologist later today and is gonna talk to them about the possibility of going back on the prednisone. Overall again I believe that the wound would be better if you would utilize Santyl but he really does not seem to be interested in going back to the [place] at this point. He has been using triple antibiotic ointment. 06/15/18 on evaluation today patient's wound actually appears to be doing about the same at this point. Fortunately there is no signs of infection at this time. He has made slight improvements although he continues to not really want to clean the wound bed at this point. He states that he just doesn't mess with it he doesn't want to cause any problems with  everything else he has going on. He has been on medication, antibiotics as prescribed by his dermatologist, for a staff infection of his lower extremities which is really drying out now and looking much better he tells me. Fortunately there is no sign of overall infection. THEODEN, MAUCH ([id number]) 06/29/18 on evaluation today patient appears to be doing well in regard to his left lateral lower surety ulcer all things considering. Fortunately his staff infection seems to be greatly improved compared to previous. He has no signs of infection and this is drying up quite nicely. He is still the doxycycline for this is no longer on cental, Dapsone, or any of the other medications. His dermatologist has recommended possibility of an infusion but right now he does not want to proceed with that. 07/13/18 on evaluation today patient appears to be doing about the same in regard to his left lateral lower surety ulcer. Fortunately there's no signs of infection at this time which is great news. Unfortunately he still builds up a significant amount of Slough/biofilm of the surface of the wound he still is not really cleaning this as he should be appropriately. Again I'm able to easily with saline and gauze remove the majority of this on the surface which if you would do this at home would likely be a dramatic improvement for him as far as getting the area  to improve. Nonetheless overall I still feel like he is making progress is just very slow. I think Santyl will be of benefit for him as well. Still he has not gotten this as of this point. 07/27/18 on evaluation today patient actually appears to be doing little worse in regards of the erythema around the periwound region of the wound he also tells me that he's been having more drainage currently compared to what he was experiencing last time I saw him. He states not quite as bad as what he had because this was infected previously but nonetheless is still appears  to be doing poorly. Fortunately there is no evidence of systemic infection at this point. The patient tells me that he is not going to be able to afford the Santyl. He is still waiting to hear about the infusion therapy with his dermatologist. Apparently she wants an updated colonoscopy first. 08/10/18 on evaluation today patient appears to be doing better in regard to his left lateral lower extremity ulcer. Fortunately he is showing signs of improvement in this regard he's actually been approved for Remicade infusion's as well although this has not been scheduled as of yet. Fortunately there's no signs of active infection at this time in regard to the wound although he is having some issues with infection of the right lower extremity is been seen as dermatologist for this. Fortunately they are definitely still working with him trying to keep things under control. 09/07/18 on evaluation today patient is actually doing rather well in regard to his left lateral lower extremity ulcer. He notes these actually having some hair grow back on his extremity which is something he has not seen in years. He also tells me that the pain is really not giving them any trouble at this time which is also good news overall she is very pleased with the progress he's using a combination of the mupirocin along with the probate is all mixed. 09/21/18 on evaluation today patient actually appears to be doing fairly well all things considered in regard to his looks from the ulcer. He's been tolerating the dressing changes without complication. Fortunately there's no signs of active infection at this time which is good news he is still on all antibiotics or prevention of the staff infection. He has been on prednisone for time although he states it is gonna contact his dermatologist and see if she put them on a short course due to some irritation that he has going on currently. Fortunately there's no evidence of any overall  worsening this is going very slow I think cental would be something that would be helpful for him although he states that $50 for tube is quite expensive. He therefore is not willing to get that at this point. 10/06/18 on evaluation today patient actually appears to be doing decently well in regard to his left lateral leg ulcer. He's been tolerating the dressing changes without complication. Fortunately there's no signs of active infection at this time. Overall I'm actually rather pleased with the progress he's making although it's slow he doesn't show any signs of infection and he does seem to be making some improvement. I do believe that he may need a switch up and dressings to try to help this to heal more appropriately and quickly. 10/19/18 on evaluation today patient actually appears to be doing better in regard to his left lateral lower extremity ulcer. This is shown signs of having much less Slough buildup at this point due to  the fact he has been using the Santyl. Obviously this is very good news. The overall size of the wound is not dramatically smaller but again the appearance is. 11/02/18 on evaluation today patient actually appears to be doing quite well in regard to his lower Trinity ulcer. A lot of the skin around the ulcer is actually somewhat irritating at this point this seems to be more due to the dressing causing irritation from the adhesive that anything else. Fortunately there is no signs of active infection at this time. 11/24/18 on evaluation today patient appears to be doing a little worse in regard to his overall appearance of his lower extremity ulcer. There's more erythema and warmth around the wound unfortunately. He is currently on doxycycline which he has been on for some time. With that being said I'm not sure that seems to be helping with what appears to possibly be an acute cellulitis with regard to his left lower extremity ulcer. No fevers, chills, nausea, or vomiting  noted at this time. 12/08/18 on evaluation today patient's wounds actually appears to be doing significantly better compared to his last evaluation. He has been using Santyl along with alternating tripling about appointment as well as the steroid cream seems to be doing Spicher, [name] ([id number]) quite well and the wound is showing signs of improvement which is excellent news. Fortunately there's no evidence of infection and in fact his culture came back negative with only normal skin flora noted. 12/21/2018 upon evaluation today patient actually appears to be doing excellent with regard to his ulcer. This is actually the best that I have seen it since have been helping to take care of him. It is both smaller as well as less slough noted on the surface of the wound and seems to be showing signs of good improvement with new skin growing from the edges. He has been using just the triamcinolone he does wonder if he can get a refill of that ointment today. 01/04/2019 upon evaluation today patient actually appears to be doing well with regard to his left lateral lower extremity ulcer. With that being said it does not appear to be that he is doing quite as well as last time as far as progression is concerned. There does not appear to be any signs of infection or significant irritation which is good news. With that being said I do believe that he may benefit from switching to a collagen based dressing based on how clean The wound appears. 01/18/2019 on evaluation today patient actually appears to be doing well with regard to his wound on the left lower extremity. He is not made a lot of progress compared to where we were previous but nonetheless does seem to be doing okay at this time which is good news. There is no signs of active infection which is also good news. My only concern currently is I do wish we can get him into utilizing the collagen dressing his insurance would not pay for the supplies that we  ordered although it appears that he may be able to order this through his supply company that he typically utilizes. This is [employer]. Nonetheless he did try to order it during the office visit today and it appears this did go through. We will see if he can get that it is a different brand but nonetheless he has collagen and I do think will be beneficial. 02/01/2019 on evaluation today patient actually appears to be doing a little worse today in regard  to the overall size of his wounds. Fortunately there is no signs of active infection at this time. That is visually. Nonetheless when this is happened before it was due to infection. For that reason were somewhat concerned about that this time as well. 02/08/2019 on evaluation today patient unfortunately appears to be doing slightly worse with regard to his wound upon evaluation today. Is measuring a little deeper and a little larger unfortunately. I am not really sure exactly what is causing this to enlarge he actually did see his dermatologist she is going to see about initiating Humira for him. Subsequently she also did do steroid injections into the wound itself in the periphery. Nonetheless still nonetheless he seems to be getting a little bit larger he is gone back to just using the steroid cream topically which I think is appropriate. I would say hold off on the collagen for the time being is definitely a good thing to do. Based on the culture results which we finally did get the final result back regarding it shows staph as the bacteria noted again that can be a normal skin bacteria based on the fact however he is having increased drainage and worsening of the wound measurement wise I would go ahead and place him on an antibiotic today I do believe for this. 02/15/2019 on evaluation today patient actually appears to be doing somewhat better in regard to his ulcer. There is no signs of worsening at this time I did review his culture results which  showed evidence of Staphylococcus aureus but not MRSA. Again this could just be more related to the normal skin bacteria although he states the drainage has slowed down quite a bit he may have had a mild infection not just colonization. And was much smaller and then since around10/04/2019 on evaluation today patient appears to be doing unfortunately worse as far as the size of the wound. I really feel like that this is steadily getting larger again it had been doing excellent right at the beginning of September we have seen a steady increase in the area of the wound it is almost 2-1/2 times the size it was on September 1. Obviously this is a bad trend this is not wanting to see. For that reason we went back to using just the topical triamcinolone cream which does seem to help with inflammation. I checked him for bacteria by way of culture and nothing showed positive there. I am considering giving him a short course of a tapering steroid Dosepak today to see if that is can be beneficial for him. The patient is in agreement with giving that a try. 03/08/2019 on evaluation today patient appears to be doing very well in comparison to last evaluation with regard to his lower extremity ulcer. This is showing signs of less inflammation and actually measuring slightly smaller compared to last time every other week over the past month and a half he has been measuring larger larger larger. Nonetheless I do believe that the issue has been inflammation the prednisone does seem to have been beneficial for him which is good news. No fevers, chills, nausea, vomiting, or diarrhea. 03/22/2019 on evaluation today patient appears to be doing about the same with regard to his leg ulcer. He has been tolerating the dressing changes without complication. With that being said the wound seems to be mostly arrested at its current size but really is not making any progress except for when we prescribed the prednisone. He did  show  some signs of dropping as far as the overall size of the wound during that interval week. Nonetheless this is something he is not on long-term at this point and unfortunately I think he is getting need either this or else the Humira which his dermatologist has discussed try to get approval for. With that being said he will be seeing his dermatologist on the 11th of this month that is November. [name], [name] ([id number]) 04/19/2019 on evaluation today patient appears to be doing really about the same the wound is measuring slightly larger compared to last time I saw him. He has not been into the office since November 2 due to the fact that he unfortunately had Covid as that his entire family. He tells me that it was rough but they did pull-through and he seems to be doing much better. Fortunately there is no signs of active infection at this time. No fevers, chills, nausea, vomiting, or diarrhea. Electronic Signature(s) Signed: 04/20/2019 5:47:36 PM By: Worthy Keeler PA-C Entered By: Worthy Keeler on 04/19/2019 08:36:40 [name], [name] ([id number]) -------------------------------------------------------------------------------- Physical Exam Details Patient Name: [name] Date of Service: 04/19/2019 8:15 AM Medical Record Number: [id number] Patient Account Number: [id number] Date of Birth/Sex: March 24, 1981 (38 y.o. M) Treating RN: Harold Barban Primary Care Provider: [name] Other Clinician: Referring Provider: [name] Treating Provider/Extender: Melburn Hake, Arianis Bowditch Weeks in Treatment: 17 Constitutional Obese and well-hydrated in no acute distress. Respiratory normal breathing without difficulty. clear to auscultation bilaterally. Cardiovascular regular rate and rhythm with normal S1, S2. Psychiatric this patient is able to make decisions and demonstrates good insight into disease process. Alert and Oriented x 3. pleasant and cooperative. Notes Patient's wound  bed currently showed signs of good granulation at this point. There does not appear to be any evidence of active infection which is again good news. He does have muscle exposed in the base of the wound but again he seems to be really showing some signs of no significant slough buildup I think the biggest issue is just managing inflammation which subsequently with then in turn help the patient to actually heal. The good news is he has been approved for the Humira which I am hoping will also benefit him. He has done well with the prednisone in the past but again the Humira is another way to approach this that his dermatologist is hopeful will take care of the situation. Electronic Signature(s) Signed: 04/20/2019 5:47:36 PM By: Worthy Keeler PA-C Entered By: Worthy Keeler on 04/19/2019 08:37:25 [name], [name] ([id number]) -------------------------------------------------------------------------------- Physician Orders Details Patient Name: [name] Date of Service: 04/19/2019 8:15 AM Medical Record Number: [id number] Patient Account Number: [id number] Date of Birth/Sex: 1981/04/21 (38 y.o. 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 Anesthetic (add to Medication List) Wound #1 Left,Lateral Lower Leg o Topical Lidocaine 4% cream applied to wound bed prior to debridement (In Clinic Only). Primary Wound Dressing Wound #1 Left,Lateral Lower Leg o Other: - TCA to wound bed only Secondary Dressing Wound #1 Left,Lateral Lower Leg o  Boardered Foam Dressing Dressing Change Frequency Wound #1 Left,Lateral Lower Leg o Change dressing every other day. Follow-up Appointments  Wound #1 Left,Lateral Lower Leg o Return Appointment in 1 week. Edema Control Wound #1 Left,Lateral Lower Leg o Elevate legs to the level of the heart and pump ankles as often as possible Electronic Signature(s) Signed: 04/20/2019 5:47:36 PM By: Worthy Keeler PA-C Signed: 04/21/2019 4:10:47 PM By: Harold Barban Entered By: Harold Barban on 04/19/2019 08:33:52 [name], [name] ([id number]) [name], [name] ([id number]) -------------------------------------------------------------------------------- Problem List Details Patient Name: [name] Date of Service: 04/19/2019 8:15 AM Medical Record Number: [id number] Patient Account Number: [id number] Date of Birth/Sex: 07/22/1980 (38 y.o. M) Treating RN: Harold Barban Primary Care Provider: [name] Other Clinician: Referring Provider: [name] Treating Provider/Extender: Melburn Hake, Adana Marik Weeks in Treatment: 123 Active Problems ICD-10 Evaluated Encounter Code Description Active Date Today Diagnosis L97.222 Non-pressure chronic ulcer of left calf with fat layer exposed 12/04/2016 No Yes L88 Pyoderma gangrenosum 03/26/2017 No Yes I87.2 Venous insufficiency (chronic) (peripheral) 12/04/2016 No Yes Inactive Problems ICD-10 Code Description Active Date Inactive Date L97.213 Non-pressure chronic ulcer of right calf with necrosis of muscle 04/02/2017 04/02/2017 Resolved Problems Electronic Signature(s) Signed: 04/20/2019 5:47:36 PM By: Worthy Keeler PA-C Entered By: Worthy Keeler on 04/19/2019 08:10:38 [name], [name] ([id number]) -------------------------------------------------------------------------------- Progress Note Details Patient Name: [name] Date of Service: 04/19/2019 8:15 AM Medical Record Number: [id number] Patient Account Number: [id number] Date of  Birth/Sex: [birth year] (38 y.o. M) Treating RN: Harold Barban Primary Care Provider: [name] Other Clinician: Referring Provider: [name] Treating Provider/Extender: Melburn Hake, Oliver Heitzenrater Weeks in Treatment: 2 Subjective Chief Complaint Information obtained from Patient He is here in follow up evaluation for LLE pyoderma ulcer History of Present Illness (HPI) 12/04/16; 38 year old man who comes into the clinic today for review of a wound on the posterior left calf. He tells me that is been there for about a year. He is not a diabetic he does smoke half a pack per day. He was seen in the ER on 11/20/16 felt to have cellulitis around the wound and was given clindamycin. An x-ray did not show osteomyelitis. The patient initially tells me that he has a milk allergy that sets off a pruritic itching rash on his lower legs which she scratches incessantly and he thinks that's what may have set up the wound. He has been using various topical antibiotics and ointments without any effect. He works in a [employer]ing [employer] and is on his feet all day. He does not have a prior history of wounds however he does have the rash on both lower legs the right arm and the ventral aspect of his left arm. These are excoriations and clearly have had scratching however there are of macular looking areas on both legs including a substantial larger area on the right leg. This does not have an underlying open area. There is no blistering. The patient tells me that 2 years ago in [place] in response to the rash on his legs he saw a dermatologist who told him he had a condition which may be pyoderma gangrenosum although I may be putting words into his mouth. He seemed to recognize this. 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 He is not in any treatment for this. He's had no recent travel 12/11/16; the patient arrives today with his wound and roughly the same condition we've  been using silver alginate this is a deep punched out wound with some surrounding erythema but no tenderness. Biopsy I did did not show confirmed pyoderma gangrenosum suggested nonspecific inflammation and  vasculitis but does not provide an actual description of what was seen by the pathologist. I'm really not able to understand this We have also received information from the patient's dermatologist in [place] notes from April 2016. This was a doctor Agarwal- antal. 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 He was prescribed topical steroid high potency under occlusion which helped but at this point the patient did not have a deep punched out wound. 12/18/16; the patient's wound is larger in terms of surface area however this surface looks better and there is less depth. The surrounding erythema also is better. The patient states that the wrap we put on came off 2 days ago when he has been using his compression stockings. He we are in the process of getting a dermatology consult. 12/26/16 on evaluation today patient's left lower extremity wound shows evidence of infection with surrounding erythema noted. He has been tolerating the dressing changes but states that he has noted more discomfort. There is a larger area of erythema surrounding the wound. No fevers, chills, nausea, or vomiting noted at this time. With that being said the wound still does have slough covering the surface. He is not allergic to any medication that he is aware of at this point. In regard to his right lower extremity he had several regions that are erythematous and pruritic he wonders if there's anything we can do to help that. 01/02/17 I reviewed patient's wound culture which was obtained his visit last week. He was placed on doxycycline at that point. Unfortunately that does not appear to be an antibiotic that would likely help with the situation however the pseudomonas noted on culture is sensitive to Cipro. Also  unfortunately patient's wound seems to have a large compared to last week's evaluation. Not severely so but there are definitely increased measurements in general. He is continuing to have discomfort as well he writes this to be a seven out of 10. In fact he would prefer me not to perform any debridement today due to the fact that he is having discomfort and considering he has an active infection on the little reluctant to do so anyway. No fevers, Kyser, Edword ([id number]) chills, nausea, or vomiting noted at this time. 01/08/17; patient seems dermatology on September 5. I suspect dermatology will want the slides from the biopsy I did sent to their pathologist. I'm not sure if there is a way we can expedite that. In any case the culture I did before I left on vacation 3 weeks ago showed Pseudomonas he was given 10 days of Cipro and per her description of her intake nurses is actually somewhat better this week although the wound is quite a bit bigger than I remember the last time I saw this. He still has 3 more days of Cipro 01/21/17; dermatology appointment tomorrow. He has completed the ciprofloxacin for Pseudomonas. Surface of the wound looks better however he is had some deterioration in the lesions on his right leg. Meantime the left lateral leg wound we will continue with sample 01/29/17; patient had his dermatology appointment but I can't yet see that note. He is completed his antibiotics. The wound is more superficial but considerably larger in circumferential area than when he came in. This is in his left lateral calf. He also has swollen erythematous areas with superficial wounds on the right leg and small papular areas on both arms. There apparently areas in her his upper thighs and buttocks I did not look at those.  Dermatology biopsied the right leg. Hopefully will have their input next week. 02/05/17; patient went back to see his dermatologist who told him that he had a "scratching problem"  as well as staph. He is now on a 30 day course of doxycycline and I believe she gave him triamcinolone cream to the right leg areas to help with the itching [not exactly sure but probably triamcinolone]. She apparently looked at the left lateral leg wound although this was not rebiopsied and I think felt to be ultimately part of the same pathogenesis. He is using sample border foam and changing nevus himself. He now has a new open area on the right posterior leg which was his biopsy site I don't have any of the dermatology notes 02/12/17; we put the patient in compression last week with SANTYL to the wound on the left leg and the biopsy. Edema is much better and the depth of the wound is now at level of skin. Area is still the same Biopsy site on the right lateral leg we've also been using santyl with a border foam dressing and he is changing this himself. 02/19/17; Using silver alginate started last week to both the substantial left leg wound and the biopsy site on the right wound. He is tolerating compression well. Has a an appointment with his primary M.D. tomorrow wondering about diuretics although I'm wondering if the edema problem is actually lymphedema 02/26/17; the patient has been to see his primary doctor Dr. Jerrel Ivory at [facility] our primary care. She started him on Lasix 20 mg and this seems to have helped with the edema. However we are not making substantial change with the left lateral calf wound and inflammation. The biopsy site on the right leg also looks stable but not really all that different. 03/12/17; the patient has been to see vein and vascular Dr. Lucky Cowboy. He has had venous reflux studies I have not reviewed these. I did get a call from his dermatology office. They felt that he might have pathergy based on their biopsy on his right leg which led them to look at the slides of the biopsy I did on the left leg and they wonder whether this represents pyoderma gangrenosum which was  the original supposition in a man with ulcerative colitis albeit inactive for many years. They therefore recommended clobetasol and tetracycline i.e. 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 03/26/17; apparently the patient just had reflux studies not an appointment with Dr. dew. She arrives in clinic today having applied clobetasol for 2-3 weeks. He notes over the last 2-3 days excessive drainage having to change the dressing 3-4 times a day and also expanding erythema. 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 Lillia Carmel at [facility] dermatology locally who kindly saw him at my request. 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 My concern was the degree of erythema medially extending from his left leg wound which was either inflammation from pyoderma or cellulitis. I put him on Augmentin however culture of the wound showed Pseudomonas which is quinolone sensitive. I really don't believe he has cellulitis however in view of everything I will continue and give him a course of Cipro. He is also on  doxycycline as an immune modulator for the pyoderma. In addition to his original wound on the left lateral leg with surrounding erythema he has a wound on the right posterior calf which was an original biopsy site done by dermatology. 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 Saw Dr. Lillia Carmel yesterday. He has been using topical antibiotics to both wound areas his original wound on the left and the biopsies/pathergy area on the right. There is definitely some improvement in the inflammation around the wound on the right although  the patient states he has increasing sensitivity of the wounds. He is on prednisone 60 and doxycycline 1 as prescribed by Dr. Lillia Carmel. He is covering the topical antibiotic with gauze and putting this in his own compression stocks and changing this daily. He states that Dr. Lottie Rater did a culture of the left leg wound yesterday 05/07/17; pyoderma gangrenosum. The patient saw Dr. Lillia Carmel yesterday and has a follow-up with her in one month. He is still using topical antibiotics to both wounds although he can't recall exactly what type. He is still on prednisone 60 mg. Dr. Lillia Carmel stated that the doxycycline could stop if we were in agreement. 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 The right medial leg was induced by [name] ([id number]) biopsy/pathergy. The area on the right is essentially healed. 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 The right medial leg has closed and remains closed. He is still on prednisone 60. He tells me he missed his last dermatology appointment with Dr. Lillia Carmel but will make another appointment. He reports that her blood sugar at a recent screen in [place] was high 200's. He was 180 today. 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 I think he is going to require still much longer prednisone perhaps another 3 months before attempting to taper. In the meantime his wound is a lot better. Smaller. He is cleaning this off daily and applying topical antibiotics. When he was last in the clinic I thought about changing to [facility] and actually put in a couple of calls to dermatology although probably not during their business hours. In any case the wound looks better smaller I don't think there is any need to change what he is doing 08/06/17-he is here in  follow up evaluation for pyoderma left leg ulcer. He continues on oral prednisone. He has been using triple antibiotic ointment. There is surface debris and we will transition to [facility] and have him return in 2 weeks. He has lost 30 pounds since his last appointment with lifestyle modification. He may benefit from topical steroid cream for treatment this can be considered at a later date. 08/22/17 on evaluation today patient appears to actually be doing rather well in regard to his left lateral lower extremity ulcer. He has actually been managed by Dr. Dellia Nims most recently. Patient is currently on oral steroids at this time. This seems to have been of benefit for him. Nonetheless his last visit was actually with Leah on 08/06/17. Currently he is not utilizing any topical steroid creams although this could be of benefit as well. No fevers, chills, nausea, or vomiting noted at this time. 09/05/17 on evaluation today patient appears to be doing better in regard to his left lateral lower extremity ulcer. He has been tolerating  the dressing changes without complication. He is using Santyl with good effect. Overall I'm very pleased with how things are standing at this point. Patient likewise is happy that this is doing better. 09/19/17 on evaluation today patient actually appears to be doing rather well in regard to his left lateral lower extremity ulcer. 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 He's not having any significant pain. 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 He has been tolerating the Santyl without complication and in general I feel like he's making good progress. 10/17/17 on evaluation today patient appears to be doing very well in regard to his left lateral lower surety ulcer. He has been tolerating the  dressing changes without complication. There does not appear to be any evidence of infection he's alternating the Santyl and the triple antibiotic ointment every other day this seems to be doing well for him. 11/03/17 on evaluation today patient appears to be doing very well in regard to his left lateral lower extremity ulcer. He is been tolerating the dressing changes without complication which is good news. Fortunately there does not appear to be any evidence of infection which is also great news. Overall is doing excellent they are starting to taper down on the prednisone is down to 40 mg at this point it also started topical clobetasol for him. 11/17/17 on evaluation today patient appears to be doing well in regard to his left lateral lower surety ulcer. He's been tolerating the dressing changes without complication. He does note that he is having no pain, no excessive drainage or discharge, and overall he feels like things are going about how he would expect and hope they would. Overall he seems to have no evidence of infection at this time in my opinion which is good news. 12/04/17-He is seen in follow-up evaluation for right lateral lower extremity ulcer. He has been applying topical steroid cream. Today's measurement show slight increase in size. Over the next 2 weeks we will transition to every other day Santyl and steroid cream. He has been encouraged to monitor for changes and notify clinic with any concerns 12/15/17 on evaluation today patient's left lateral motion the ulcer and fortunately is doing worse again at this point. This just since last week to this week has close to doubled in size according to the patient. I did not seeing last week's I do not have a visual to compare this to in our system was also down so we do not have all the charts and at this point. Nonetheless it does have me somewhat concerned in regard to the fact that again he was worried enough about it he has contact the  dermatology that placed them back on the full strength, 50 mg a day of the prednisone that he was taken previous. He continues to alternate using clobetasol along with Santyl at this point. He is obviously somewhat frustrated. 12/22/17 on evaluation today patient appears to be doing a little worse compared to last evaluation. Unfortunately the wound is a little deeper and slightly larger than the last week's evaluation. With that being said he has made some progress in regard to the irritation surrounding at this time unfortunately despite that progress that's been made he still has a significant issue going on here. I'm not certain that he is having really  any true infection at this time although with the Pyoderma gangrenosum it can [name], Demoni ([id number]) sometimes be difficult to differentiate infection versus just inflammation. For that reason I discussed with him today the possibility of perform a wound culture to ensure there's nothing overtly infected. 01/06/18 on evaluation today patient's wound is larger and deeper than previously evaluated. With that being said it did appear that his wound was infected after my last evaluation with him. Subsequently I did end up prescribing a prescription for Bactrim DS which she has been taking and having no complication with. Fortunately there does not appear to be any evidence of infection at this point in time as far as anything spreading, no want to touch, and overall I feel like things are showing signs of improvement. 01/13/18 on evaluation today patient appears to be even a little larger and deeper than last time. There still muscle exposed in the base of the wound. Nonetheless he does appear to be less erythematous I do believe inflammation is calming down also believe the infection looks like it's probably resolved at this time based on what I'm seeing. No fevers, chills, nausea, or vomiting noted at this time. 01/30/18 on evaluation today patient  actually appears to visually look better for the most part. Unfortunately those visually this looks better he does seem to potentially have what may be an abscess in the muscle that has been noted in the central portion of the wound. This is the first time that I have noted what appears to be fluctuance in the central portion of the muscle. With that being said I'm somewhat more concerned about the fact that this might indicate an abscess formation at this location. I do believe that an ultrasound would be appropriate. This is likely something we need to try to do as soon as possible. He has been switch to mupirocin ointment and he is no longer using the steroid ointment as prescribed by dermatology he sees them again next week he's been decreased from 60 to 40 mg of prednisone. 03/09/18 on evaluation today patient actually appears to be doing a little better compared to last time I saw him. There's not as much erythema surrounding the wound itself. He I did review his most recent infectious disease note which was dated 02/24/18. He saw Dr. Michel Bickers in [place]. 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 Patient is now off of antibiotics and they are continually observing this. There seems to be no change in the past two weeks in my pinion based on what the patient says and what I see today compared to what Dr. Megan Salon likely saw two weeks ago. No fevers, chills, nausea, or vomiting noted at this time. 03/23/18 on evaluation today patient's wound actually appears to be showing signs of improvement which is good news. He is currently still on the Dapsone. He is also working on tapering the prednisone to get off of this and Dr. Lottie Rater is working with him in this regard. Nonetheless overall I feel like the wound is doing well it does appear based on the infectious disease note that I reviewed from Dr.  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 I did also review the results of his ultrasound of left lower extremity which revealed there  was a dentist tissue in the base of the wound without an abscess noted. 04/06/18 on evaluation today the patient's left lateral lower extremity ulcer actually appears to be doing fairly well which is excellent news. There does not appear to be any evidence of infection at this time which is also great news. Overall he still does have a significantly large ulceration although little by little he seems to be making progress. He is down to 10 mg a day of the prednisone. 04/20/18 on evaluation today patient actually appears to be doing excellent at this time in regard to his left lower extremity ulcer. He's making signs of good progress unfortunately this is taking much longer than we would really like to see but nonetheless he is making progress. Fortunately there does not appear to be any evidence of infection at this time. No fevers, chills, nausea, or vomiting noted at this time. The patient has not been using the Santyl due to the cost he hadn't got in this field yet. He's mainly been using the antibiotic ointment topically. Subsequently he also tells me that he really has not been scrubbing in the shower I think this would be helpful again as I told him it doesn't have to be anything too aggressive to even make it believe just enough to keep it free of some of the loose slough and biofilm on the wound surface. 05/11/18 on evaluation today patient's wound appears to be making slow but sure progress in regard to the left lateral lower extremity ulcer. He is been tolerating the dressing changes without complication. Fortunately there does not appear to be any evidence of infection at this time. He is still just using triple antibiotic ointment along with  clobetasol occasionally over the area. He never got the Santyl and really does not seem to intend to in my pinion. 06/01/18 on evaluation today patient appears to be doing a little better in regard to his left lateral lower extremity ulcer. He states that overall he does not feel like he is doing as well with the Dapsone as he did with the prednisone. Nonetheless he sees his dermatologist later today and is gonna talk to them about the possibility of going back on the prednisone. Overall again I believe that the wound would be better if you would utilize Santyl but he really does not seem to be interested in going back to the [employer] at this point. He has been using triple antibiotic ointment. [name], [name] ([id number]) 06/15/18 on evaluation today patient's wound actually appears to be doing about the same at this point. Fortunately there is no signs of infection at this time. He has made slight improvements although he continues to not really want to clean the wound bed at this point. He states that he just doesn't mess with it he doesn't want to cause any problems with everything else he has going on. He has been on medication, antibiotics as prescribed by his dermatologist, for a staff infection of his lower extremities which is really drying out now and looking much better he tells me. Fortunately there is no sign of overall infection. 06/29/18 on evaluation today patient appears to be doing well in regard to his left lateral lower surety ulcer all things considering. Fortunately his staff infection seems to be greatly improved compared to previous. He has no signs of infection and this is drying up quite nicely. He is still the doxycycline for this is no longer on cental, Dapsone, or any of the  other medications. His dermatologist has recommended possibility of an infusion but right now he does not want to proceed with that. 07/13/18 on evaluation today patient appears to be doing about the same in  regard to his left lateral lower surety ulcer. Fortunately there's no signs of infection at this time which is great news. Unfortunately he still builds up a significant amount of Slough/biofilm of the surface of the wound he still is not really cleaning this as he should be appropriately. Again I'm able to easily with saline and gauze remove the majority of this on the surface which if you would do this at home would likely be a dramatic improvement for him as far as getting the area to improve. Nonetheless overall I still feel like he is making progress is just very slow. I think Santyl will be of benefit for him as well. Still he has not gotten this as of this point. 07/27/18 on evaluation today patient actually appears to be doing little worse in regards of the erythema around the periwound region of the wound he also tells me that he's been having more drainage currently compared to what he was experiencing last time I saw him. He states not quite as bad as what he had because this was infected previously but nonetheless is still appears to be doing poorly. Fortunately there is no evidence of systemic infection at this point. The patient tells me that he is not going to be able to afford the Santyl. He is still waiting to hear about the infusion therapy with his dermatologist. Apparently she wants an updated colonoscopy first. 08/10/18 on evaluation today patient appears to be doing better in regard to his left lateral lower extremity ulcer. Fortunately he is showing signs of improvement in this regard he's actually been approved for Remicade infusion's as well although this has not been scheduled as of yet. Fortunately there's no signs of active infection at this time in regard to the wound although he is having some issues with infection of the right lower extremity is been seen as dermatologist for this. Fortunately they are definitely still working with him trying to keep things under  control. 09/07/18 on evaluation today patient is actually doing rather well in regard to his left lateral lower extremity ulcer. He notes these actually having some hair grow back on his extremity which is something he has not seen in years. He also tells me that the pain is really not giving them any trouble at this time which is also good news overall she is very pleased with the progress he's using a combination of the mupirocin along with the probate is all mixed. 09/21/18 on evaluation today patient actually appears to be doing fairly well all things considered in regard to his looks from the ulcer. He's been tolerating the dressing changes without complication. Fortunately there's no signs of active infection at this time which is good news he is still on all antibiotics or prevention of the staff infection. He has been on prednisone for time although he states it is gonna contact his dermatologist and see if she put them on a short course due to some irritation that he has going on currently. Fortunately there's no evidence of any overall worsening this is going very slow I think cental would be something that would be helpful for him although he states that $50 for tube is quite expensive. He therefore is not willing to get that at this point. 10/06/18 on evaluation  today patient actually appears to be doing decently well in regard to his left lateral leg ulcer. He's been tolerating the dressing changes without complication. Fortunately there's no signs of active infection at this time. Overall I'm actually rather pleased with the progress he's making although it's slow he doesn't show any signs of infection and he does seem to be making some improvement. I do believe that he may need a switch up and dressings to try to help this to heal more appropriately and quickly. 10/19/18 on evaluation today patient actually appears to be doing better in regard to his left lateral lower extremity ulcer. This  is shown signs of having much less Slough buildup at this point due to the fact he has been using the [employer]. Obviously this is very good news. The overall size of the wound is not dramatically smaller but again the appearance is. 11/02/18 on evaluation today patient actually appears to be doing quite well in regard to his lower [place] ulcer. A lot of the skin around the ulcer is actually somewhat irritating at this point this seems to be more due to the dressing causing irritation from the adhesive that anything else. Fortunately there is no signs of active infection at this time. 11/24/18 on evaluation today patient appears to be doing a little worse in regard to his overall appearance of his lower extremity Herbel, Artemis ([id number]) ulcer. There's more erythema and warmth around the wound unfortunately. He is currently on doxycycline which he has been on for some time. With that being said I'm not sure that seems to be helping with what appears to possibly be an acute cellulitis with regard to his left lower extremity ulcer. No fevers, chills, nausea, or vomiting noted at this time. 12/08/18 on evaluation today patient's wounds actually appears to be doing significantly better compared to his last evaluation. He has been using Santyl along with alternating tripling about appointment as well as the steroid cream seems to be doing quite well and the wound is showing signs of improvement which is excellent news. Fortunately there's no evidence of infection and in fact his culture came back negative with only normal skin flora noted. 12/21/2018 upon evaluation today patient actually appears to be doing excellent with regard to his ulcer. This is actually the best that I have seen it since have been helping to take care of him. It is both smaller as well as less slough noted on the surface of the wound and seems to be showing signs of good improvement with new skin growing from the edges. He has been  using just the triamcinolone he does wonder if he can get a refill of that ointment today. 01/04/2019 upon evaluation today patient actually appears to be doing well with regard to his left lateral lower extremity ulcer. With that being said it does not appear to be that he is doing quite as well as last time as far as progression is concerned. There does not appear to be any signs of infection or significant irritation which is good news. With that being said I do believe that he may benefit from switching to a collagen based dressing based on how clean The wound appears. 01/18/2019 on evaluation today patient actually appears to be doing well with regard to his wound on the left lower extremity. He is not made a lot of progress compared to where we were previous but nonetheless does seem to be doing okay at this time which is good news.  There is no signs of active infection which is also good news. My only concern currently is I do wish we can get him into utilizing the collagen dressing his insurance would not pay for the supplies that we ordered although it appears that he may be able to order this through his supply company that he typically utilizes. This is [employer]. Nonetheless he did try to order it during the office visit today and it appears this did go through. We will see if he can get that it is a different brand but nonetheless he has collagen and I do think will be beneficial. 02/01/2019 on evaluation today patient actually appears to be doing a little worse today in regard to the overall size of his wounds. Fortunately there is no signs of active infection at this time. That is visually. Nonetheless when this is happened before it was due to infection. For that reason were somewhat concerned about that this time as well. 02/08/2019 on evaluation today patient unfortunately appears to be doing slightly worse with regard to his wound upon evaluation today. Is measuring a little deeper and  a little larger unfortunately. I am not really sure exactly what is causing this to enlarge he actually did see his dermatologist she is going to see about initiating Humira for him. Subsequently she also did do steroid injections into the wound itself in the periphery. Nonetheless still nonetheless he seems to be getting a little bit larger he is gone back to just using the steroid cream topically which I think is appropriate. I would say hold off on the collagen for the time being is definitely a good thing to do. Based on the culture results which we finally did get the final result back regarding it shows staph as the bacteria noted again that can be a normal skin bacteria based on the fact however he is having increased drainage and worsening of the wound measurement wise I would go ahead and place him on an antibiotic today I do believe for this. 02/15/2019 on evaluation today patient actually appears to be doing somewhat better in regard to his ulcer. There is no signs of worsening at this time I did review his culture results which showed evidence of Staphylococcus aureus but not MRSA. Again this could just be more related to the normal skin bacteria although he states the drainage has slowed down quite a bit he may have had a mild infection not just colonization. And was much smaller and then since around10/04/2019 on evaluation today patient appears to be doing unfortunately worse as far as the size of the wound. I really feel like that this is steadily getting larger again it had been doing excellent right at the beginning of September we have seen a steady increase in the area of the wound it is almost 2-1/2 times the size it was on September 1. Obviously this is a bad trend this is not wanting to see. For that reason we went back to using just the topical triamcinolone cream which does seem to help with inflammation. I checked him for bacteria by way of culture and nothing showed  positive there. I am considering giving him a short course of a tapering steroid Dosepak today to see if that is can be beneficial for him. The patient is in agreement with giving that a try. 03/08/2019 on evaluation today patient appears to be doing very well in comparison to last evaluation with regard to his lower extremity ulcer. This  is showing signs of less inflammation and actually measuring slightly smaller compared to last time every other week over the past month and a half he has been measuring larger larger larger. Nonetheless I do believe that the issue has been inflammation the prednisone does seem to have been beneficial for him which is good news. No fevers, chills, nausea, vomiting, or diarrhea. BENTLIE, CATANZARO ([id number]) 03/22/2019 on evaluation today patient appears to be doing about the same with regard to his leg ulcer. He has been tolerating the dressing changes without complication. With that being said the wound seems to be mostly arrested at its current size but really is not making any progress except for when we prescribed the prednisone. He did show some signs of dropping as far as the overall size of the wound during that interval week. Nonetheless this is something he is not on long-term at this point and unfortunately I think he is getting need either this or else the Humira which his dermatologist has discussed try to get approval for. With that being said he will be seeing his dermatologist on the 11th of this month that is November. 04/19/2019 on evaluation today patient appears to be doing really about the same the wound is measuring slightly larger compared to last time I saw him. He has not been into the office since November 2 due to the fact that he unfortunately had Covid as that his entire family. He tells me that it was rough but they did pull-through and he seems to be doing much better. Fortunately there is no signs of active infection at this time. No  fevers, chills, nausea, vomiting, or diarrhea. Patient History Information obtained from Patient. Family History Diabetes - Mother,Maternal Grandparents, Heart Disease - Mother,Maternal Grandparents, Hereditary Spherocytosis - Siblings, Hypertension - Maternal Grandparents, Stroke - Siblings, No family history of Cancer, Kidney Disease, Lung Disease, Seizures, Thyroid Problems, Tuberculosis. Social History Current some day smoker, Marital Status - Married, Alcohol Use - Rarely, Drug Use - No History, Caffeine Use - Moderate. Medical History Eyes Denies history of Cataracts, Glaucoma, Optic Neuritis Ear/Nose/Mouth/Throat Denies history of Chronic sinus problems/congestion, Middle ear problems Hematologic/Lymphatic Denies history of Anemia, Hemophilia, Human Immunodeficiency Virus, Lymphedema, Sickle Cell Disease Respiratory Patient has history of Sleep Apnea - cpap Denies history of Aspiration, Asthma, Chronic Obstructive Pulmonary Disease (COPD), Pneumothorax Cardiovascular Patient has history of Hypertension Denies history of Angina, Arrhythmia, Congestive Heart Failure, Coronary Artery Disease, Deep Vein Thrombosis, Hypotension, Myocardial Infarction, Peripheral Arterial Disease, Peripheral Venous Disease, Phlebitis, Vasculitis Gastrointestinal Patient has history of Colitis Endocrine Denies history of Type I Diabetes, Type II Diabetes Genitourinary Denies history of End Stage Renal Disease Immunological Denies history of Lupus Erythematosus, Raynaud s, Scleroderma Integumentary (Skin) Denies history of History of Burn, History of pressure wounds Musculoskeletal Denies history of Gout, Rheumatoid Arthritis, Osteoarthritis, Osteomyelitis Neurologic Denies history of Dementia, Neuropathy, Quadriplegia, Paraplegia, Seizure Disorder Oncologic Denies history of Received Chemotherapy, Received Radiation Psychiatric Denies history of Anorexia/bulimia, Confinement Anxiety Review of  Systems (ROS) Constitutional Symptoms (General Health) Denies complaints or symptoms of Fatigue, Fever, Chills, Marked Weight Change. JAICION, LAURIE ([id number]) Respiratory Denies complaints or symptoms of Chronic or frequent coughs, Shortness of Breath. Cardiovascular Denies complaints or symptoms of Chest pain, LE edema. Psychiatric Denies complaints or symptoms of Anxiety, Claustrophobia. Objective Constitutional Obese and well-hydrated in no acute distress. Vitals Time Taken: 8:18 AM, Height: 71 in, Weight: 338 lbs, BMI: 47.1, Temperature: 98.1 F, Pulse: 72 bpm, Respiratory Rate: 16 breaths/min, Blood Pressure:  149/81 mmHg. Respiratory normal breathing without difficulty. clear to auscultation bilaterally. Cardiovascular regular rate and rhythm with normal S1, S2. Psychiatric this patient is able to make decisions and demonstrates good insight into disease process. Alert and Oriented x 3. pleasant and cooperative. General Notes: Patient's wound bed currently showed signs of good granulation at this point. There does not appear to be any evidence of active infection which is again good news. He does have muscle exposed in the base of the wound but again he seems to be really showing some signs of no significant slough buildup I think the biggest issue is just managing inflammation which subsequently with then in turn help the patient to actually heal. The good news is he has been approved for the Humira which I am hoping will also benefit him. He has done well with the prednisone in the past but again the Humira is another way to approach this that his dermatologist is hopeful will take care of the situation. Integumentary (Hair, Skin) Wound #1 status is Open. Original cause of wound was Gradually Appeared. The wound is located on the Left,Lateral Lower Leg. The wound measures 4.9cm length x 3.5cm width x 0.4cm depth; 13.47cm^2 area and 5.388cm^3 volume. There is Fat Layer  (Subcutaneous Tissue) Exposed exposed. There is no tunneling or undermining noted. There is a medium amount of serous drainage noted. The wound margin is distinct with the outline attached to the wound base. There is large (67-100%) red granulation within the wound bed. There is a small (1-33%) amount of necrotic tissue within the wound bed including Adherent Slough. 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 Anesthetic (add to Medication List): Wound #1 Left,Lateral Lower Leg: Topical Lidocaine 4% cream applied to wound bed prior to debridement (In Clinic Only). Primary Wound Dressing: Wound #1 Left,Lateral Lower Leg: Other: - TCA to wound bed only Secondary Dressing: Wound #1 Left,Lateral Lower Leg: Boardered Foam Dressing Dressing Change Frequency: Wound #1 Left,Lateral Lower Leg: Change dressing every other day. Follow-up Appointments: Wound #1 Left,Lateral Lower Leg: Return Appointment in 1 week. Edema Control: Wound #1 Left,Lateral Lower Leg: Elevate legs to the level of the heart and pump ankles as often as possible 1. Patient's wound bed currently showed signs of good granulation in some areas, muscle exposure to others, and obviously some inflammation in and surrounding the wound bed. I think that continue with the triamcinolone topical and a thin film is still the best way to go. 2. I am in a recommend as well that we go ahead and continue with the bordered foam dressing over top he is putting some gauze dry over top of the ointment because of how much drainage there is been I am okay with that as well. 3. I am also going to suggest that the patient continue to elevate his legs as much as possible obviously he cannot work but when he is  home he definitely needs to try to elevate in order to help with edema control as well. We will see patient back for reevaluation in 2 weeks here in the clinic. If anything worsens or changes patient will contact our office for additional recommendations. Electronic Signature(s) Signed: 04/20/2019 5:47:36 PM By: Worthy Keeler PA-C Entered By: Worthy Keeler on 04/19/2019 08:38:34 Coen, [name] ([id number]) -------------------------------------------------------------------------------- ROS/PFSH Details Patient Name: [name]  Date of Service: 04/19/2019 8:15 AM Medical Record Number: [id number] Patient Account Number: [id number] Date of Birth/Sex: 1980-07-15 (38 y.o. M) Treating RN: Harold Barban Primary Care Provider: [name] Other Clinician: Referring Provider: [name] Treating Provider/Extender: Melburn Hake, Randolf Sansoucie Weeks in Treatment: 13 Information Obtained From Patient Constitutional Symptoms (General Health) Complaints and Symptoms: Negative for: Fatigue; Fever; Chills; Marked Weight Change Respiratory Complaints and Symptoms: Negative for: Chronic or frequent coughs; Shortness of Breath Medical History: Positive for: Sleep Apnea - cpap Negative for: Aspiration; Asthma; Chronic Obstructive Pulmonary Disease (COPD); Pneumothorax Cardiovascular Complaints and Symptoms: Negative for: Chest pain; LE edema Medical History: Positive for: Hypertension Negative for: Angina; Arrhythmia; Congestive Heart Failure; Coronary Artery Disease; Deep Vein Thrombosis; Hypotension; Myocardial Infarction; Peripheral Arterial Disease; Peripheral Venous Disease; Phlebitis; Vasculitis Psychiatric Complaints and Symptoms: Negative for: Anxiety; Claustrophobia Medical History: Negative for: Anorexia/bulimia; Confinement Anxiety Eyes Medical History: Negative for: Cataracts; Glaucoma; Optic Neuritis Ear/Nose/Mouth/Throat Medical History: Negative for: Chronic sinus  problems/congestion; Middle ear problems Hematologic/Lymphatic Medical History: Negative for: Anemia; Hemophilia; Human Immunodeficiency Virus; Lymphedema; Sickle Cell Disease Gruber, Bianca ([id number]) Gastrointestinal Medical History: Positive for: Colitis Endocrine Medical History: Negative for: Type I Diabetes; Type II Diabetes Genitourinary Medical History: Negative for: End Stage Renal Disease Immunological Medical History: Negative for: Lupus Erythematosus; Raynaudos; Scleroderma Integumentary (Skin) Medical History: Negative for: History of Burn; History of pressure wounds Musculoskeletal Medical History: Negative for: Gout; Rheumatoid Arthritis; Osteoarthritis; Osteomyelitis Neurologic Medical History: Negative for: Dementia; Neuropathy; Quadriplegia; Paraplegia; Seizure Disorder Oncologic Medical History: Negative for: Received Chemotherapy; Received Radiation Immunizations Pneumococcal Vaccine: Received Pneumococcal Vaccination: No Implantable Devices No devices added Family and Social History Cancer: No; Diabetes: Yes - Mother,Maternal Grandparents; Heart Disease: Yes - Mother,Maternal Grandparents; Hereditary Spherocytosis: Yes - Siblings; Hypertension: Yes - Maternal Grandparents; Kidney Disease: No; Lung Disease: No; Seizures: No; Stroke: Yes - Siblings; Thyroid Problems: No; Tuberculosis: No; Current some day smoker; Marital Status - Married; Alcohol Use: Rarely; Drug Use: No History; Caffeine Use: Moderate; Financial Concerns: No; Food, Clothing or Shelter Needs: No; Support System Lacking: No; Transportation Concerns: No Physician Affirmation I have reviewed and agree with the above information. Electronic Signature(s) Signed: 04/20/2019 5:47:36 PM By: Hedy Camara, Ceylon ([id number]) Signed: 04/21/2019 4:10:47 PM By: Harold Barban Entered By: Worthy Keeler on 04/19/2019 08:37:04 DEONTAY, LADNIER  ([id number]) -------------------------------------------------------------------------------- SuperBill Details Patient Name: [name] Date of Service: 04/19/2019 Medical Record Number: [id number] Patient Account Number: [id number] Date of Birth/Sex: Feb 17, 1981 (38 y.o. M) Treating RN: Harold Barban Primary Care Provider: [name] Other Clinician: Referring Provider: [name] Treating Provider/Extender: Melburn Hake, Avyana Puffenbarger Weeks in Treatment: 123 Diagnosis Coding ICD-10 Codes Code Description [phone] Non-pressure chronic ulcer of left calf with fat layer exposed L88 Pyoderma gangrenosum I87.2 Venous insufficiency (chronic) (peripheral) Facility Procedures CPT4 Code: [id number] Description: 63893 - WOUND CARE VISIT-LEV 3 EST PT Modifier: Quantity: 1 Physician Procedures CPT4 Code: [id number] Description: 81157 - WC PHYS LEVEL 4 - EST PT ICD-10 Diagnosis Description L97.222 Non-pressure chronic ulcer of left calf with fat layer expo L88 Pyoderma gangrenosum I87.2 Venous insufficiency (chronic) (peripheral) Modifier: sed Quantity: 1 Electronic Signature(s) Signed: 04/20/2019 5:47:36 PM By: Worthy Keeler PA-C Entered By: Worthy Keeler on 04/19/2019 08:38:49

## 2019-04-22 NOTE — Unmapped (Signed)
Patient labs stable. Patient notified via mychart.

## 2019-04-23 LAB — NOVEL CORONAVIRUS, NAA: SARS-CoV-2, NAA: NOT DETECTED

## 2019-04-28 DIAGNOSIS — L98499 Non-pressure chronic ulcer of skin of other sites with unspecified severity: Principal | ICD-10-CM

## 2019-04-29 NOTE — Unmapped (Signed)
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 Patient has a tendency to pick at certain areas (evidenced by lichenification) which exacerbates the process. Primary lesion may be initial papules/pustules of PG but over time he has also demonstrated some itching related to venous stasis dermatitis and possible folliculitis.  His lesions are likely multifactorial related to all of these issues..  - Discussed risk of developing new PG lesions in areas of trauma   - Discussed attempting to decrease to 100 mg daily of doxycycline given that he has decent control vs intermittent pulses. Has been on tetracyclines for many years at this point.  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 Hx of UC also likely related. . LL ulcer healing acutely worsening. Present for many years at this point  - Currently off of long-term high-dose prednisone use that was complicated by development of cushingoid features and development of diabetes mellitus  -May consider biopsy in the setting of chronic nonhealing ulcer if concern for malignancy  -Recommend starting humira 80 mg every other week given persistence for many years despite multiple treatments as below. Rx sent and was denied. Decreased to 40 mg every other week with approval. May need to increase his dose given his weight.  -Given acute worsening with concern for involvement of muscle - also started process for obtaining approval for renflexis (previously denied) - this would also be preferred given his weight  - Continue follow-up with wound care  - Continue wearing compression stockings  -cont hibiclens -escalate from hydrocortisone and TAC cream per patient preference to clobetasol   -consider clinda/rifampin  -for lower leg wound : consider aspirin or pentoxyfilline or horsechestnut seed extract  -consider dapsone gel  -patient following with GI and rheum  -in reserve: sski (normal baseline thyroid 1 year ago), cellcept, methotrexate, renflexis, consult Dr. Elder Cyphers.      [facility] - quant gold negative 04/19/19  Discussed risks of infection, potential risk of malignancy, injection site reactions. No hx of CHF, MS, lupus, malignancy, hepatitis, TB        I spent 16 minutes on the phone with the patient. I spent an additional 20 minutes on pre- and post-visit activities.     The patient was physically located in [place] or a state in which I am permitted to provide care. The patient and/or parent/guardian understood that s/he may incur co-pays and cost sharing, and agreed to the telemedicine visit. The visit was reasonable and appropriate under the circumstances given the patient's presentation at the time.    The patient and/or parent/guardian has been advised of the potential risks and limitations of this mode of treatment (including, but not limited to, the absence of in-person examination) and has agreed to be treated using telemedicine. The patient's/patient's family's questions regarding telemedicine have been answered.     If the visit was completed in an ambulatory setting, the patient and/or parent/guardian has also been advised to contact their provider???s office for worsening conditions, and seek emergency medical treatment and/or call 911 if the patient deems either necessary.         RTC: 6 weeks in [place]    SUBJECTIVE  ulcer    HPI: This is  a pleasant 38 y.o. male with a history of pyoderma gangrenosum and MRSA infection who presents today for f/up. Last seen by myself  a few weeks ago. Recently Covid positive and ill however nor improving. States his ulcer is stable and he's not developing new spots. This area has been present for many years with intermittent resolution and recurrence.    Disease history:  -Starting about 2013, he started to develop itchy bumps that enlarge when he scratches and picks at them. This initially affected just his lower legs, but more recently prior to initial presentation to our clinic, involvement spread to his thighs, buttocks, arms, and forearms.  -Areas start as tiny bumps that he then scratches. 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   -In spring 2018, an ulcer started on the L lateral calf and gradually enlarged, but was slowly healing and shrinking by the time of presentation to our clinic. Denies any trauma at this site and notes that it started similar to other rashes on skin. Prior treatments include oral abx, including as recently as 12/2016 with courses of clinda and doxy, as well as topical steroids. He has h/o ulcerative colitis, now in remission without meds since 2015. Denies h/o liver or kidney disease or DM. Used to live in [place] when this started. Works for [employer] and is on feet all day, endorses swelling of legs. Denies new meds prior to onset.  - At his initial visit 01/22/2017, clobetasol ointment and doxycycline 100mg  bid x 4 weeks were started. A biopsy of a keratotic pruritic plaque was obtained. 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 Tissue culture grew 4+ MSSA. 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 - w/up for underlying pruritus (given patient report and difficulty not picking at the involved areas) on 10/17 revealed unremarkable HIV, hepatitis panel, LDH, TSH, free T4 . Outside slides from previous bx were reviewed by Dr. Eyvonne Left and showed venous stasis changes as well as a neutrophilic infiltrate that could be consistent with PG. He was started on topical clobetasol BID. He was continued on PO doxycycline for additional 6 weeks. His left leg ulcer continued to grow and the site of his previous biopsy on the posterior right leg has also developed a large non healing ulcer.   -10/18: w/up for underlying etiology of PG negative on 10/18: negative spep/upep, cbc w/diff consistently normal, and reports a known hx of ulcerative colitis which is in remssion  - Repeat biopsy as below done on 03/26/17 primarily c/w PG vs infectious with one potential organism on Fite stain but unclear if related.   - Patient started on prednisone on 04/01/17 with almost resolution by 7/19.  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 Stopped doxepin and gabapentin given lack of improvement  -rx sent for humira  02/2019 given worsening. Process started for renflexis in event of failure      Pertinent PMH:   Ulcerative colitis, managed by [facility]  Join pain managed by [facility]  HTN  DM   Denies h/o liver, kidney abnormalities     ROS: A balance of 10 systems was  reviewed and negative.     PE: unable to examine

## 2019-05-03 ENCOUNTER — Ambulatory Visit: Payer: 59 | Admitting: Physician Assistant

## 2019-05-05 DIAGNOSIS — L98499 Non-pressure chronic ulcer of skin of other sites with unspecified severity: Principal | ICD-10-CM

## 2019-05-06 MED ORDER — DOXYCYCLINE HYCLATE 100 MG TABLET/CAPSULE WRAPPER
Freq: Two times a day (BID) | ORAL | 1 refills | 30.00000 days | Status: CP
Start: 2019-05-06 — End: ?

## 2019-05-10 ENCOUNTER — Encounter: Payer: 59 | Attending: Physician Assistant | Admitting: Physician Assistant

## 2019-05-10 ENCOUNTER — Other Ambulatory Visit: Payer: Self-pay

## 2019-05-10 DIAGNOSIS — I10 Essential (primary) hypertension: Secondary | ICD-10-CM | POA: Diagnosis not present

## 2019-05-10 DIAGNOSIS — F17200 Nicotine dependence, unspecified, uncomplicated: Secondary | ICD-10-CM | POA: Diagnosis not present

## 2019-05-10 DIAGNOSIS — L88 Pyoderma gangrenosum: Secondary | ICD-10-CM | POA: Diagnosis not present

## 2019-05-10 DIAGNOSIS — I872 Venous insufficiency (chronic) (peripheral): Secondary | ICD-10-CM | POA: Diagnosis not present

## 2019-05-10 DIAGNOSIS — L97222 Non-pressure chronic ulcer of left calf with fat layer exposed: Secondary | ICD-10-CM | POA: Insufficient documentation

## 2019-05-10 NOTE — Progress Notes (Addendum)
[name], [name] ([id number]) Visit Report for 05/10/2019 Chief Complaint Document Details Patient Name: [name], [name] Date of Service: 05/10/2019 8:00 AM Medical Record Number: [id number] Patient Account Number: [id number] Date of Birth/Sex: October 21, 1980 (38 y.o. M) Treating RN: [name] Primary Care Provider: Alma [name] Other Clinician: Referring Provider: Alma [name] Treating Provider/Extender: [name], [name] in Treatment: 126 Information Obtained from: Patient Chief Complaint He is here in follow up evaluation for LLE pyoderma ulcer Electronic Signature(s) Signed: 05/10/2019 8:22:04 AM By: Worthy Keeler PA-C Entered By: Worthy [name] on 05/10/2019 08:22:03 [name] ([id number]) -------------------------------------------------------------------------------- HPI Details Patient Name: [name] Date of Service: 05/10/2019 8:00 AM Medical Record Number: [id number] Patient Account Number: [id number] Date of Birth/Sex: 1980-10-30 (38 y.o. M) Treating RN: [name] Primary Care Provider: Alma [name] Other Clinician: Referring Provider: Alma [name] Treating Provider/Extender: [name], Micha Dosanjh [name] in Treatment: 126 History of Present Illness HPI Description: 12/04/16; 38 year old man who comes into the clinic today for review of a wound on the posterior left calf. He tells me that is been there for about a year. He is not a diabetic he does smoke half a pack per day. He was seen in the ER on 11/20/16 felt to have cellulitis around the wound and was given clindamycin. An x-ray did not show osteomyelitis. The patient initially tells me that he has a milk allergy that sets off a pruritic itching rash on his lower legs which she scratches incessantly and he thinks that's what may have set up the wound. He has been using various topical antibiotics and ointments without any effect. He works in a [employer] and is on his feet all day. He does not  have a prior history of wounds however he does have the rash on both lower legs the right arm and the ventral aspect of his left arm. These are excoriations and clearly have had scratching however there are of macular looking areas on both legs including a substantial larger area on the right leg. This does not have an underlying open area. There is no blistering. The patient tells me that 2 years ago in [place] in response to the rash on his legs he saw a dermatologist who told him he had a condition which may be pyoderma gangrenosum although I may be putting words into his mouth. He seemed to recognize this. 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 He is not in any treatment for this. He's had no recent travel 12/11/16; the patient arrives today with his wound and roughly the same condition we've been using silver alginate this is a deep punched out wound with some surrounding erythema but no tenderness. Biopsy I did did not show confirmed pyoderma gangrenosum suggested nonspecific inflammation and vasculitis but does not provide an actual description of what was seen by the pathologist. I'm really not able to understand this We have also received information from the patient's dermatologist in [place] notes from April 2016. This was a doctor Agarwal- antal. 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 He was prescribed topical steroid high potency under occlusion which helped but at this point the patient did not have a deep punched out wound. 12/18/16; the patient's wound is larger in terms of surface area however this surface looks better and there is less depth. The surrounding erythema also is better. The patient states that the wrap we put on came off 2 days ago when he has been using  his compression stockings. He we are in the process of getting a dermatology consult. 12/26/16 on evaluation today patient's left lower extremity wound shows  evidence of infection with surrounding erythema noted. He has been tolerating the dressing changes but states that he has noted more discomfort. There is a larger area of erythema surrounding the wound. No fevers, chills, nausea, or vomiting noted at this time. With that being said the wound still does have slough covering the surface. He is not allergic to any medication that he is aware of at this point. In regard to his right lower extremity he had several regions that are erythematous and pruritic he wonders if there's anything we can do to help that. 01/02/17 I reviewed patient's wound culture which was obtained his visit last week. He was placed on doxycycline at that point. Unfortunately that does not appear to be an antibiotic that would likely help with the situation however the pseudomonas noted on culture is sensitive to Cipro. Also unfortunately patient's wound seems to have a large compared to last week's evaluation. Not severely so but there are definitely increased measurements in general. He is continuing to have discomfort as well he writes this to be a seven out of 10. In fact he would prefer me not to perform any debridement today due to the fact that he is having discomfort and considering he has an active infection on the little reluctant to do so anyway. No fevers, chills, nausea, or vomiting noted at this time. 01/08/17; patient seems dermatology on September 5. I suspect dermatology will want the slides from the biopsy I did sent to their pathologist. I'm not sure if there is a way we can expedite that. In any case the culture I did before I left on vacation 3 [name] ago showed Pseudomonas he was given 10 days of Cipro and per her description of her intake nurses is actually somewhat better this week although the wound is quite a bit bigger than I remember the last time I saw this. He still has 3 more days of Cipro [name] ([id number]) 01/21/17; dermatology appointment  tomorrow. He has completed the ciprofloxacin for Pseudomonas. Surface of the wound looks better however he is had some deterioration in the lesions on his right leg. Meantime the left lateral leg wound we will continue with sample 01/29/17; patient had his dermatology appointment but I can't yet see that note. He is completed his antibiotics. The wound is more superficial but considerably larger in circumferential area than when he came in. This is in his left lateral calf. He also has swollen erythematous areas with superficial wounds on the right leg and small papular areas on both arms. There apparently areas in her his upper thighs and buttocks I did not look at those. Dermatology biopsied the right leg. Hopefully will have their input next week. 02/05/17; patient went back to see his dermatologist who told him that he had a "scratching problem" as well as staph. He is now on a 30 day course of doxycycline and I believe she gave him triamcinolone cream to the right leg areas to help with the itching [not exactly sure but probably triamcinolone]. She apparently looked at the left lateral leg wound although this was not rebiopsied and I think felt to be ultimately part of the same pathogenesis. He is using sample border foam and changing nevus himself. He now has a new open area on the right posterior leg which was his biopsy site I don't  have any of the dermatology notes 02/12/17; we put the patient in compression last week with SANTYL to the wound on the left leg and the biopsy. Edema is much better and the depth of the wound is now at level of skin. Area is still the same oBiopsy site on the right lateral leg we've also been using santyl with a border foam dressing and he is changing this himself. 02/19/17; Using silver alginate started last week to both the substantial left leg wound and the biopsy site on the right wound. He is tolerating compression well. Has a an appointment with his primary  M.D. tomorrow wondering about diuretics although I'm wondering if the edema problem is actually lymphedema 02/26/17; the patient has been to see his primary doctor Dr. Jerrel Ivory at [facility] our primary care. She started him on Lasix 20 mg and this seems to have helped with the edema. However we are not making substantial change with the left lateral calf wound and inflammation. The biopsy site on the right leg also looks stable but not really all that different. 03/12/17; the patient has been to see vein and vascular Dr. Lucky Cowboy. He has had venous reflux studies I have not reviewed these. I did get a call from his dermatology office. 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 They therefore recommended clobetasol and tetracycline i.e. 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 03/26/17; apparently the patient just had reflux studies not an appointment with Dr. dew. She arrives in clinic today having applied clobetasol for 2-3 [name]. He notes over the last 2-3 days excessive drainage having to change the dressing 3-4 times a day and also expanding erythema. 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 Lillia Carmel at [facility] dermatology locally who kindly saw him at my request. 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 My concern was the degree of erythema medially extending from his left leg wound which was either  inflammation from pyoderma or cellulitis. I put him on Augmentin however culture of the wound showed Pseudomonas which is quinolone sensitive. I really don't believe he has cellulitis however in view of everything I will continue and give him a course of Cipro. He is also on doxycycline as an immune modulator for the pyoderma. In addition to his original wound on the left lateral leg with surrounding erythema he has a wound on the right posterior calf which was an original biopsy site done by dermatology. 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 Saw Dr. Lillia Carmel yesterday. He has been using topical antibiotics to both wound areas his original wound on the left and the biopsies/pathergy area on the right. There is definitely some improvement in the inflammation around the wound on the right although the patient states he has increasing sensitivity of the wounds. He is on prednisone 60 and doxycycline 1 as prescribed by Dr. Lillia Carmel. He is covering the topical  antibiotic with gauze and putting this in his own compression stocks and changing this daily. He states that Dr. Lottie Rater did a culture of the left leg wound yesterday 05/07/17; pyoderma gangrenosum. The patient saw Dr. Lillia Carmel yesterday and has a follow-up with her in one month. He is still using topical antibiotics to both wounds although he can't recall exactly what type. He is still on prednisone 60 mg. Dr. Lillia Carmel stated that the doxycycline could stop if we were in agreement. 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 The right medial leg was induced by biopsy/pathergy. The area on the right is essentially healed. 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 The right medial leg has  closed and remains closed. He is still on prednisone 60. oHe tells me he missed his last dermatology appointment with Dr. Lillia Carmel but will make another appointment. He reports that her blood sugar at a recent screen in [place] was high 200's. He was 180 today. He is more cushingoid blood pressure is [name], [name] ([id number]) up a bit. I think he is going to require still much longer prednisone perhaps another 3 months before attempting to taper. In the meantime his wound is a lot better. Smaller. He is cleaning this off daily and applying topical antibiotics. When he was last in the clinic I thought about changing to [facility] and actually put in a couple of calls to dermatology although probably not during their business hours. 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 He continues on oral prednisone. He has been using triple antibiotic ointment. There is surface debris and we will transition to [facility] and have him return in 2 [name]. He has lost 30 pounds since his last appointment with lifestyle modification. He may benefit from topical steroid cream for treatment this can be considered at a later date. 08/22/17 on evaluation today patient appears to actually be doing rather well in regard to his left lateral lower extremity ulcer. He has actually been managed by Dr. Dellia Nims most recently. Patient is currently on oral steroids at this time. This seems to have been of benefit for him. Nonetheless his last visit was actually with Leah on 08/06/17. Currently he is not utilizing any topical steroid creams although this could be of benefit as well. No fevers, chills, nausea, or vomiting noted at this time. 09/05/17 on evaluation today patient appears to be doing better in regard to his left lateral lower extremity ulcer. He has been tolerating the dressing changes without  complication. He is using Santyl with good effect. Overall I'm very pleased with how things are standing at this point. Patient likewise is happy that this is doing better. 09/19/17 on evaluation today patient actually appears to be doing rather well in regard to his left lateral lower extremity ulcer. 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 He's not having any significant pain. 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 He has been tolerating the Santyl without complication and in general I feel like he's making good  progress. 10/17/17 on evaluation today patient appears to be doing very well in regard to his left lateral lower surety ulcer. He has been tolerating the dressing changes without complication. There does not appear to be any evidence of infection he's alternating the Santyl and the triple antibiotic ointment every other day this seems to be doing well for him. 11/03/17 on evaluation today patient appears to be doing very well in regard to his left lateral lower extremity ulcer. He is been tolerating the dressing changes without complication which is good news. Fortunately there does not appear to be any evidence of infection which is also great news. Overall is doing excellent they are starting to taper down on the prednisone is down to 40 mg at this point it also started topical clobetasol for him. 11/17/17 on evaluation today patient appears to be doing well in regard to his left lateral lower surety ulcer. He's been tolerating the dressing changes without complication. He does note that he is having no pain, no excessive drainage or discharge, and overall he feels like things are going about how he would expect and hope they would. Overall he seems to have no evidence of infection at this time in my  opinion which is good news. 12/04/17-He is seen in follow-up evaluation for right lateral lower extremity ulcer. He has been applying topical steroid cream. Today's measurement show slight increase in size. Over the next 2 [name] we will transition to every other day Santyl and steroid cream. He has been encouraged to monitor for changes and notify clinic with any concerns 12/15/17 on evaluation today patient's left lateral motion the ulcer and fortunately is doing worse again at this point. This just since last week to this week has close to doubled in size according to the patient. I did not seeing last week's I do not have a visual to compare this to in our system was also down so we do not have all the charts and at this point. Nonetheless it does have me somewhat concerned in regard to the fact that again he was worried enough about it he has contact the dermatology that placed them back on the full strength, 50 mg a day of the prednisone that he was taken previous. He continues to alternate using clobetasol along with Santyl at this point. He is obviously somewhat frustrated. 12/22/17 on evaluation today patient appears to be doing a little worse compared to last evaluation. Unfortunately the wound is a little deeper and slightly larger than the last week's evaluation. With that being said he has made some progress in regard to the irritation surrounding at this time unfortunately despite that progress that's been made he still has a significant issue going on here. 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 For that reason I discussed with him today the possibility of perform a wound culture to ensure there's nothing overtly infected. 01/06/18 on evaluation today patient's wound is larger and deeper than previously evaluated. With that being said it  did appear that his wound was infected after my last evaluation with him. Subsequently I did end up prescribing a prescription for Bactrim DS which she has been taking and having no complication with. Fortunately there does not appear to be any evidence of [name], [name] ([id number]) infection at this point in time as far as anything spreading, no want to  touch, and overall I feel like things are showing signs of improvement. 01/13/18 on evaluation today patient appears to be even a little larger and deeper than last time. There still muscle exposed in the base of the wound. Nonetheless he does appear to be less erythematous I do believe inflammation is calming down also believe the infection looks like it's probably resolved at this time based on what I'm seeing. No fevers, chills, nausea, or vomiting noted at this time. 01/30/18 on evaluation today patient actually appears to visually look better for the most part. Unfortunately those visually this looks better he does seem to potentially have what may be an abscess in the muscle that has been noted in the central portion of the wound. This is the first time that I have noted what appears to be fluctuance in the central portion of the muscle. With that being said I'm somewhat more concerned about the fact that this might indicate an abscess formation at this location. I do believe that an ultrasound would be appropriate. This is likely something we need to try to do as soon as possible. He has been switch to mupirocin ointment and he is no longer using the steroid ointment as prescribed by dermatology he sees them again next week he's been decreased from 60 to 40 mg of prednisone. 03/09/18 on evaluation today patient actually appears to be doing a little better compared to last time I saw him. There's not as much erythema surrounding the wound itself. He I did review his most recent infectious disease note which was dated 02/24/18. He saw Dr. Michel Bickers in [place]. 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 Patient is now off of antibiotics and they are continually observing this. There seems to be no change in the past two [name] in my pinion based on what the patient says and what I see today compared to what Dr. Megan Salon likely saw two [name] ago. No fevers, chills, nausea, or vomiting noted at this time. 03/23/18 on evaluation today patient's wound actually appears to be showing signs of improvement which is good news. He is currently still on the Dapsone. He is also working on tapering the prednisone to get off of this and Dr. Lottie Rater is working with him in this regard. Nonetheless overall I feel like the wound is doing well it does appear based on the infectious disease note that I reviewed from Dr. 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 I did also review the results of his ultrasound of left lower extremity which revealed there was a dentist tissue in the base of the wound without an abscess noted. 04/06/18 on evaluation today the patient's left lateral lower extremity ulcer actually appears to be doing fairly well which is excellent news. There does not appear to be any evidence of infection at this time which is also great news. Overall he still does have a significantly large ulceration although little by little he seems to be making progress. He is down to 10 mg a day of the prednisone. 04/20/18 on evaluation today patient actually appears to be doing excellent at this time in regard to his left lower extremity ulcer. He's making signs of good progress unfortunately this is taking much longer than we would really like  to see but nonetheless he is making progress. Fortunately there does not appear to be any evidence of  infection at this time. No fevers, chills, nausea, or vomiting noted at this time. The patient has not been using the Santyl due to the cost he hadn't got in this field yet. He's mainly been using the antibiotic ointment topically. Subsequently he also tells me that he really has not been scrubbing in the shower I think this would be helpful again as I told him it doesn't have to be anything too aggressive to even make it believe just enough to keep it free of some of the loose slough and biofilm on the wound surface. 05/11/18 on evaluation today patient's wound appears to be making slow but sure progress in regard to the left lateral lower extremity ulcer. He is been tolerating the dressing changes without complication. Fortunately there does not appear to be any evidence of infection at this time. He is still just using triple antibiotic ointment along with clobetasol occasionally over the area. He never got the Santyl and really does not seem to intend to in my pinion. 06/01/18 on evaluation today patient appears to be doing a little better in regard to his left lateral lower extremity ulcer. He states that overall he does not feel like he is doing as well with the Dapsone as he did with the prednisone. Nonetheless he sees his dermatologist later today and is gonna talk to them about the possibility of going back on the prednisone. Overall again I believe that the wound would be better if you would utilize Santyl but he really does not seem to be interested in going back to the [place] at this point. He has been using triple antibiotic ointment. 06/15/18 on evaluation today patient's wound actually appears to be doing about the same at this point. Fortunately there is no signs of infection at this time. He has made slight improvements although he continues to not really want to clean the wound bed at this point. He states that he just doesn't mess with it he doesn't want to cause any problems with  everything else he has going on. He has been on medication, antibiotics as prescribed by his dermatologist, for a staff infection of his lower extremities which is really drying out now and looking much better he tells me. Fortunately there is no sign of overall infection. [name], [name] ([id number]) 06/29/18 on evaluation today patient appears to be doing well in regard to his left lateral lower surety ulcer all things considering. Fortunately his staff infection seems to be greatly improved compared to previous. He has no signs of infection and this is drying up quite nicely. He is still the doxycycline for this is no longer on cental, Dapsone, or any of the other medications. His dermatologist has recommended possibility of an infusion but right now he does not want to proceed with that. 07/13/18 on evaluation today patient appears to be doing about the same in regard to his left lateral lower surety ulcer. Fortunately there's no signs of infection at this time which is great news. Unfortunately he still builds up a significant amount of Slough/biofilm of the surface of the wound he still is not really cleaning this as he should be appropriately. Again I'm able to easily with saline and gauze remove the majority of this on the surface which if you would do this at home would likely be a dramatic improvement for him as far as getting the area  to improve. Nonetheless overall I still feel like he is making progress is just very slow. I think Santyl will be of benefit for him as well. Still he has not gotten this as of this point. 07/27/18 on evaluation today patient actually appears to be doing little worse in regards of the erythema around the periwound region of the wound he also tells me that he's been having more drainage currently compared to what he was experiencing last time I saw him. He states not quite as bad as what he had because this was infected previously but nonetheless is still appears  to be doing poorly. Fortunately there is no evidence of systemic infection at this point. The patient tells me that he is not going to be able to afford the Santyl. He is still waiting to hear about the infusion therapy with his dermatologist. Apparently she wants an updated colonoscopy first. 08/10/18 on evaluation today patient appears to be doing better in regard to his left lateral lower extremity ulcer. Fortunately he is showing signs of improvement in this regard he's actually been approved for Remicade infusion's as well although this has not been scheduled as of yet. Fortunately there's no signs of active infection at this time in regard to the wound although he is having some issues with infection of the right lower extremity is been seen as dermatologist for this. Fortunately they are definitely still working with him trying to keep things under control. 09/07/18 on evaluation today patient is actually doing rather well in regard to his left lateral lower extremity ulcer. He notes these actually having some hair grow back on his extremity which is something he has not seen in years. He also tells me that the pain is really not giving them any trouble at this time which is also good news overall she is very pleased with the progress he's using a combination of the mupirocin along with the probate is all mixed. 09/21/18 on evaluation today patient actually appears to be doing fairly well all things considered in regard to his looks from the ulcer. He's been tolerating the dressing changes without complication. Fortunately there's no signs of active infection at this time which is good news he is still on all antibiotics or prevention of the staff infection. He has been on prednisone for time although he states it is gonna contact his dermatologist and see if she put them on a short course due to some irritation that he has going on currently. Fortunately there's no evidence of any overall  worsening this is going very slow I think cental would be something that would be helpful for him although he states that $50 for tube is quite expensive. He therefore is not willing to get that at this point. 10/06/18 on evaluation today patient actually appears to be doing decently well in regard to his left lateral leg ulcer. He's been tolerating the dressing changes without complication. Fortunately there's no signs of active infection at this time. Overall I'm actually rather pleased with the progress he's making although it's slow he doesn't show any signs of infection and he does seem to be making some improvement. I do believe that he may need a switch up and dressings to try to help this to heal more appropriately and quickly. 10/19/18 on evaluation today patient actually appears to be doing better in regard to his left lateral lower extremity ulcer. This is shown signs of having much less Slough buildup at this point due to  the fact he has been using the Santyl. Obviously this is very good news. The overall size of the wound is not dramatically smaller but again the appearance is. 11/02/18 on evaluation today patient actually appears to be doing quite well in regard to his lower Trinity ulcer. A lot of the skin around the ulcer is actually somewhat irritating at this point this seems to be more due to the dressing causing irritation from the adhesive that anything else. Fortunately there is no signs of active infection at this time. 11/24/18 on evaluation today patient appears to be doing a little worse in regard to his overall appearance of his lower extremity ulcer. There's more erythema and warmth around the wound unfortunately. He is currently on doxycycline which he has been on for some time. With that being said I'm not sure that seems to be helping with what appears to possibly be an acute cellulitis with regard to his left lower extremity ulcer. No fevers, chills, nausea, or vomiting  noted at this time. 12/08/18 on evaluation today patient's wounds actually appears to be doing significantly better compared to his last evaluation. He has been using Santyl along with alternating tripling about appointment as well as the steroid cream seems to be doing [name], [name] ([id number]) quite well and the wound is showing signs of improvement which is excellent news. Fortunately there's no evidence of infection and in fact his culture came back negative with only normal skin flora noted. 12/21/2018 upon evaluation today patient actually appears to be doing excellent with regard to his ulcer. This is actually the best that I have seen it since have been helping to take care of him. It is both smaller as well as less slough noted on the surface of the wound and seems to be showing signs of good improvement with new skin growing from the edges. He has been using just the triamcinolone he does wonder if he can get a refill of that ointment today. 01/04/2019 upon evaluation today patient actually appears to be doing well with regard to his left lateral lower extremity ulcer. With that being said it does not appear to be that he is doing quite as well as last time as far as progression is concerned. There does not appear to be any signs of infection or significant irritation which is good news. With that being said I do believe that he may benefit from switching to a collagen based dressing based on how clean The wound appears. 01/18/2019 on evaluation today patient actually appears to be doing well with regard to his wound on the left lower extremity. He is not made a lot of progress compared to where we were previous but nonetheless does seem to be doing okay at this time which is good news. There is no signs of active infection which is also good news. My only concern currently is I do wish we can get him into utilizing the collagen dressing his insurance would not pay for the supplies that we  ordered although it appears that he may be able to order this through his supply company that he typically utilizes. This is [employer]. Nonetheless he did try to order it during the office visit today and it appears this did go through. We will see if he can get that it is a different brand but nonetheless he has collagen and I do think will be beneficial. 02/01/2019 on evaluation today patient actually appears to be doing a little worse today in regard  to the overall size of his wounds. Fortunately there is no signs of active infection at this time. That is visually. Nonetheless when this is happened before it was due to infection. For that reason were somewhat concerned about that this time as well. 02/08/2019 on evaluation today patient unfortunately appears to be doing slightly worse with regard to his wound upon evaluation today. Is measuring a little deeper and a little larger unfortunately. I am not really sure exactly what is causing this to enlarge he actually did see his dermatologist she is going to see about initiating Humira for him. Subsequently she also did do steroid injections into the wound itself in the periphery. Nonetheless still nonetheless he seems to be getting a little bit larger he is gone back to just using the steroid cream topically which I think is appropriate. I would say hold off on the collagen for the time being is definitely a good thing to do. Based on the culture results which we finally did get the final result back regarding it shows staph as the bacteria noted again that can be a normal skin bacteria based on the fact however he is having increased drainage and worsening of the wound measurement wise I would go ahead and place him on an antibiotic today I do believe for this. 02/15/2019 on evaluation today patient actually appears to be doing somewhat better in regard to his ulcer. There is no signs of worsening at this time I did review his culture results which  showed evidence of Staphylococcus aureus but not MRSA. Again this could just be more related to the normal skin bacteria although he states the drainage has slowed down quite a bit he may have had a mild infection not just colonization. And was much smaller and then since around10/04/2019 on evaluation today patient appears to be doing unfortunately worse as far as the size of the wound. I really feel like that this is steadily getting larger again it had been doing excellent right at the beginning of September we have seen a steady increase in the area of the wound it is almost 2-1/2 times the size it was on September 1. Obviously this is a bad trend this is not wanting to see. For that reason we went back to using just the topical triamcinolone cream which does seem to help with inflammation. I checked him for bacteria by way of culture and nothing showed positive there. I am considering giving him a short course of a tapering steroid Dosepak today to see if that is can be beneficial for him. The patient is in agreement with giving that a try. 03/08/2019 on evaluation today patient appears to be doing very well in comparison to last evaluation with regard to his lower extremity ulcer. This is showing signs of less inflammation and actually measuring slightly smaller compared to last time every other week over the past month and a half he has been measuring larger larger larger. Nonetheless I do believe that the issue has been inflammation the prednisone does seem to have been beneficial for him which is good news. No fevers, chills, nausea, vomiting, or diarrhea. 03/22/2019 on evaluation today patient appears to be doing about the same with regard to his leg ulcer. He has been tolerating the dressing changes without complication. With that being said the wound seems to be mostly arrested at its current size but really is not making any progress except for when we prescribed the prednisone. He did  show  some signs of dropping as far as the overall size of the wound during that interval week. Nonetheless this is something he is not on long-term at this point and unfortunately I think he is getting need either this or else the Humira which his dermatologist has discussed try to get approval for. With that being said he will be seeing his dermatologist on the 11th of this month that is November. SANJAY, BROADFOOT ([id number]) 04/19/2019 on evaluation today patient appears to be doing really about the same the wound is measuring slightly larger compared to last time I saw him. He has not been into the office since November 2 due to the fact that he unfortunately had Covid as that his entire family. He tells me that it was rough but they did pull-through and he seems to be doing much better. Fortunately there is no signs of active infection at this time. No fevers, chills, nausea, vomiting, or diarrhea. 05/10/2019 on evaluation today patient unfortunately appears to be doing significantly worse as compared to last time I saw him. He does tell me that he has had his first dose of Humira and actually is scheduled to get the next one in the upcoming week. With that being said he tells me also that in the past several days he has been having a lot of issues with green drainage she showed me a picture this is more blue-green in color. He is also been having issues with increased sloughy buildup and the wound does appear to be larger today. Obviously this is not the direction that we want everything to take based on the starting of his Humira. Nonetheless I think this is definitely a result of likely infection and to be honest I think this is probably Pseudomonas causing the infection based on what I am seeing. Electronic Signature(s) Signed: 05/10/2019 10:03:10 AM By: Worthy Keeler PA-C Entered By: Worthy [name] on 05/10/2019 10:03:10 DAELIN, HASTE  ([id number]) -------------------------------------------------------------------------------- Physical Exam Details Patient Name: [name] Date of Service: 05/10/2019 8:00 AM Medical Record Number: [id number] Patient Account Number: [id number] Date of Birth/Sex: [birth year] (38 y.o. M) Treating RN: [name] Primary Care Provider: Alma [name] Other Clinician: Referring Provider: Alma [name] Treating Provider/Extender: [name], Kenijah Benningfield [name] in Treatment: 89 Constitutional Well-nourished and well-hydrated in no acute distress. Respiratory normal breathing without difficulty. clear to auscultation bilaterally. Cardiovascular regular rate and rhythm with normal S1, S2. Psychiatric this patient is able to make decisions and demonstrates good insight into disease process. Alert and Oriented x 3. pleasant and cooperative. Notes Upon inspection patient's wound bed showed increased sloughy buildup along with erythema surrounding the wound. Really was not too terribly hot to touch but nonetheless I do feel like this is evidence of infection based on what I am seeing. He does have blue-green drainage which is consistent with Pseudomonas as well. Nonetheless I believe that Levaquin would probably be a good option for him. Electronic Signature(s) Signed: 05/10/2019 10:03:54 AM By: Worthy Keeler PA-C Entered By: Worthy [name] on 05/10/2019 10:03:54 [name] ([id number]) -------------------------------------------------------------------------------- Physician Orders Details Patient Name: [name] Date of Service: 05/10/2019 8:00 AM Medical Record Number: [id number] Patient Account Number: [id number] Date of Birth/Sex: 1981/03/04 (38 y.o. 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 Anesthetic (add to Medication List) Wound #1 Left,Lateral Lower Leg o Topical Lidocaine 4% cream applied to wound bed prior to debridement (In Clinic Only). Primary Wound Dressing Wound #1 Left,Lateral Lower Leg o Other: - TCA to wound bed only Secondary Dressing Wound #1 Left,Lateral Lower Leg o Boardered Foam Dressing Dressing Change Frequency Wound #1 Left,Lateral Lower Leg o Change dressing every other day. Follow-up Appointments Wound #1 Left,Lateral Lower Leg o Return Appointment in 2 [name]. Edema Control Wound #1 Left,Lateral Lower Leg o Elevate legs to the level of the heart and pump ankles as often as possible Laboratory o Bacteria identified in Wound by Culture (MICRO) - Left Lateral Lower Leg oooo LOINC Code: 8242-3 oooo Convenience Name: Wound culture routine [name], KURTZ ([id number]) Electronic Signature(s) Signed: 05/10/2019 3:59:48 PM By: [name] Signed: 05/10/2019 6:06:28 PM By: Worthy Keeler PA-C Previous Signature: 05/10/2019 8:34:06 AM Version By: Worthy Keeler PA-C Entered By: [name] on 05/10/2019 08:38:40 [name] ([id number]) -------------------------------------------------------------------------------- Problem List Details Patient Name: [name] Date of Service: 05/10/2019 8:00 AM Medical Record Number: [id number] Patient Account Number: [id number] Date of Birth/Sex: 03/02/1981 (38 y.o. M) Treating RN: [name] Primary Care Provider: Alma [name] Other Clinician: Referring Provider: Alma [name] Treating Provider/Extender: [name], Ahnya Akre [name] in Treatment: 126 Active Problems ICD-10 Evaluated Encounter Code  Description Active Date Today Diagnosis L97.222 Non-pressure chronic ulcer of left calf with fat layer exposed 12/04/2016 No Yes L88 Pyoderma gangrenosum 03/26/2017 No Yes I87.2 Venous insufficiency (chronic) (peripheral) 12/04/2016 No Yes Inactive Problems ICD-10 Code Description Active Date Inactive Date L97.213 Non-pressure chronic ulcer of right calf with necrosis of muscle 04/02/2017 04/02/2017 Resolved Problems Electronic Signature(s) Signed: 05/10/2019 8:21:41 AM By: Worthy Keeler PA-C Entered By: Worthy [name] on 05/10/2019 08:21:41 [name] ([id number]) -------------------------------------------------------------------------------- Progress Note Details Patient Name: [name] Date of Service: 05/10/2019 8:00 AM Medical Record Number: [id number] Patient Account Number: [id number] Date of Birth/Sex: [birth year] (38 y.o. M) Treating RN: [name] Primary Care Provider: Alma [name] Other Clinician: Referring Provider: Alma [name] Treating Provider/Extender: [name], Randal Yepiz [name] in Treatment: 126 Subjective Chief Complaint Information obtained from Patient He is here in follow up evaluation for LLE pyoderma ulcer History of Present Illness (HPI) 12/04/16; 38 year old man who comes into the clinic today for review of a wound on the posterior left calf. He tells me that is been there for about a year. He is not a diabetic he does smoke half a pack per day. He was seen in the ER on 11/20/16 felt to have cellulitis around the wound and was given clindamycin. An x-ray did not show osteomyelitis. The patient initially tells me that he has a milk allergy that sets off a pruritic itching rash on his lower legs which she scratches incessantly and he thinks that's what may have set up the wound. He has been using various topical antibiotics and ointments without any effect. He works in a [employer]ing [employer] and is on his feet all day. He does not have a prior history of  wounds however he does have the rash on both lower legs the right arm and the ventral aspect of his left arm. These are excoriations and clearly have had scratching however there are of macular looking areas on both legs including a substantial larger area on the right leg. This does not have an underlying open  area. There is no blistering. The patient tells me that 2 years ago in [place] in response to the rash on his legs he saw a dermatologist who told him he had a condition which may be pyoderma gangrenosum although I may be putting words into his mouth. He seemed to recognize this. 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 He is not in any treatment for this. He's had no recent travel 12/11/16; the patient arrives today with his wound and roughly the same condition we've been using silver alginate this is a deep punched out wound with some surrounding erythema but no tenderness. Biopsy I did did not show confirmed pyoderma gangrenosum suggested nonspecific inflammation and vasculitis but does not provide an actual description of what was seen by the pathologist. I'm really not able to understand this We have also received information from the patient's dermatologist in [place] notes from April 2016. This was a doctor Agarwal- antal. 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 He was prescribed topical steroid high potency under occlusion which helped but at this point the patient did not have a deep punched out wound. 12/18/16; the patient's wound is larger in terms of surface area however this surface looks better and there is less depth. The surrounding erythema also is better. The patient states that the wrap we put on came off 2 days ago when he has been using his compression stockings. He we are in the process of getting a dermatology consult. 12/26/16 on evaluation today patient's left lower extremity wound shows evidence of infection with  surrounding erythema noted. He has been tolerating the dressing changes but states that he has noted more discomfort. There is a larger area of erythema surrounding the wound. No fevers, chills, nausea, or vomiting noted at this time. With that being said the wound still does have slough covering the surface. He is not allergic to any medication that he is aware of at this point. In regard to his right lower extremity he had several regions that are erythematous and pruritic he wonders if there's anything we can do to help that. 01/02/17 I reviewed patient's wound culture which was obtained his visit last week. He was placed on doxycycline at that point. Unfortunately that does not appear to be an antibiotic that would likely help with the situation however the pseudomonas noted on culture is sensitive to Cipro. Also unfortunately patient's wound seems to have a large compared to last week's evaluation. Not severely so but there are definitely increased measurements in general. He is continuing to have discomfort as well he writes this to be a seven out of 10. In fact he would prefer me not to perform any debridement today due to the fact that he is having discomfort and considering he has an active infection on the little reluctant to do so anyway. No fevers, [name], [name] ([id number]) chills, nausea, or vomiting noted at this time. 01/08/17; patient seems dermatology on September 5. I suspect dermatology will want the slides from the biopsy I did sent to their pathologist. I'm not sure if there is a way we can expedite that. In any case the culture I did before I left on vacation 3 [name] ago showed Pseudomonas he was given 10 days of Cipro and per her description of her intake nurses is actually somewhat better this week although the wound is quite a bit bigger than I remember the last time I saw this. He  still has 3 more days of Cipro 01/21/17; dermatology appointment tomorrow. He has completed the  ciprofloxacin for Pseudomonas. Surface of the wound looks better however he is had some deterioration in the lesions on his right leg. Meantime the left lateral leg wound we will continue with sample 01/29/17; patient had his dermatology appointment but I can't yet see that note. He is completed his antibiotics. The wound is more superficial but considerably larger in circumferential area than when he came in. This is in his left lateral calf. He also has swollen erythematous areas with superficial wounds on the right leg and small papular areas on both arms. There apparently areas in her his upper thighs and buttocks I did not look at those. Dermatology biopsied the right leg. Hopefully will have their input next week. 02/05/17; patient went back to see his dermatologist who told him that he had a "scratching problem" as well as staph. He is now on a 30 day course of doxycycline and I believe she gave him triamcinolone cream to the right leg areas to help with the itching [not exactly sure but probably triamcinolone]. She apparently looked at the left lateral leg wound although this was not rebiopsied and I think felt to be ultimately part of the same pathogenesis. He is using sample border foam and changing nevus himself. He now has a new open area on the right posterior leg which was his biopsy site I don't have any of the dermatology notes 02/12/17; we put the patient in compression last week with SANTYL to the wound on the left leg and the biopsy. Edema is much better and the depth of the wound is now at level of skin. Area is still the same Biopsy site on the right lateral leg we've also been using santyl with a border foam dressing and he is changing this himself. 02/19/17; Using silver alginate started last week to both the substantial left leg wound and the biopsy site on the right wound. He is tolerating compression well. Has a an appointment with his primary M.D. tomorrow wondering about  diuretics although I'm wondering if the edema problem is actually lymphedema 02/26/17; the patient has been to see his primary doctor Dr. Jerrel Ivory at [place] our primary care. She started him on Lasix 20 mg and this seems to have helped with the edema. However we are not making substantial change with the left lateral calf wound and inflammation. The biopsy site on the right leg also looks stable but not really all that different. 03/12/17; the patient has been to see vein and vascular Dr. Lucky Cowboy. He has had venous reflux studies I have not reviewed these. I did get a call from his dermatology office. 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 They therefore recommended clobetasol and tetracycline i.e. 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 03/26/17; apparently the patient just had reflux studies not an appointment with Dr. dew. She arrives in clinic today having applied clobetasol for 2-3 [name]. He notes over the last 2-3 days excessive drainage having to change the dressing 3-4 times a day and also expanding erythema. He states the expanding erythema seems to come and go and was last this red was earlier in  the month.he is on doxycycline 150 mg twice a day as an anti-inflammatory systemic therapy for possible pyoderma gangrenosum along with the topical clobetasol 04/02/17; the patient was seen last week by Dr. Lillia Carmel at [facility] dermatology locally who kindly saw him at my request. 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 My concern was the degree of erythema medially extending from his left leg wound which was either inflammation from pyoderma  or cellulitis. I put him on Augmentin however culture of the wound showed Pseudomonas which is quinolone sensitive. I really don't believe he has cellulitis however in view of everything I will continue and give him a course of Cipro. He is also on doxycycline as an immune modulator for the pyoderma. In addition to his original wound on the left lateral leg with surrounding erythema he has a wound on the right posterior calf which was an original biopsy site done by dermatology. 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 Saw Dr. Lillia Carmel yesterday. He has been using topical antibiotics to both wound areas his original wound on the left and the biopsies/pathergy area on the right. There is definitely some improvement in the inflammation around the wound on the right although the patient states he has increasing sensitivity of the wounds. He is on prednisone 60 and doxycycline 1 as prescribed by Dr. Lillia Carmel. He is covering the topical antibiotic with gauze and putting this in his own compression stocks and changing this daily. He states that Dr. Lottie Rater did a culture of the left leg wound yesterday 05/07/17; pyoderma gangrenosum. The patient saw Dr. Lillia Carmel yesterday and has a follow-up with her in one month. He is still using topical antibiotics to both wounds although he can't recall exactly what type. He is still on prednisone 60 mg. Dr. Lillia Carmel stated that the doxycycline could stop if we were in agreement. 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 The right medial leg was induced by [name] ([id number]) biopsy/pathergy. The area on the right is essentially healed. 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 The right medial leg has  closed and remains closed. He is still on prednisone 60. He tells me he missed his last dermatology appointment with Dr. Lillia Carmel but will make another appointment. He reports that her blood sugar at a recent screen in [place] was high 200's. He was 180 today. 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 I think he is going to require still much longer prednisone perhaps another 3 months before attempting to taper. In the meantime his wound is a lot better. Smaller. He is cleaning this off daily and applying topical antibiotics. When he was last in the clinic I thought about changing to [facility] and actually put in a couple of calls to dermatology although probably not during their business hours. 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 He continues on oral prednisone. He has been using triple antibiotic ointment. There is surface debris and we will transition to [facility] and have him return in 2 [name]. He has lost 30 pounds since his last appointment with lifestyle modification. He  may benefit from topical steroid cream for treatment this can be considered at a later date. 08/22/17 on evaluation today patient appears to actually be doing rather well in regard to his left lateral lower extremity ulcer. He has actually been managed by Dr. Dellia Nims most recently. Patient is currently on oral steroids at this time. This seems to have been of benefit for him. Nonetheless his last visit was actually with Leah on 08/06/17. Currently he is not utilizing any topical steroid creams although this could be of benefit as well. No fevers, chills, nausea, or vomiting noted at this time. 09/05/17 on evaluation today patient appears to be doing better in regard to his left lateral lower extremity ulcer. He has been tolerating the dressing changes without complication. He is using Santyl  with good effect. Overall I'm very pleased with how things are standing at this point. Patient likewise is happy that this is doing better. 09/19/17 on evaluation today patient actually appears to be doing rather well in regard to his left lateral lower extremity ulcer. 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 He's not having any significant pain. 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 He has been tolerating the Santyl without complication and in general I feel like he's making good progress. 10/17/17 on evaluation today patient appears to be doing very well in regard to his left lateral lower surety ulcer. He has been tolerating the dressing changes without complication. There does not appear to be any evidence of infection he's alternating the Santyl and the triple antibiotic ointment every other day this seems to be doing well for him. 11/03/17 on evaluation today patient appears to be doing very well in regard to his left lateral lower extremity ulcer. He is been tolerating the dressing changes without complication which is good news. Fortunately there does not appear to be any evidence of infection which is also great news. Overall is doing excellent they are starting to taper down on the prednisone is down to 40 mg at this point it also started topical clobetasol for him. 11/17/17 on evaluation today patient appears to be doing well in regard to his left lateral lower surety ulcer. He's been tolerating the dressing changes without complication. He does note that he is having no pain, no excessive drainage or discharge, and overall he feels like things are going about how he would expect and hope they would. Overall he seems to have no evidence of infection at this time in my opinion which is good  news. 12/04/17-He is seen in follow-up evaluation for right lateral lower extremity ulcer. He has been applying topical steroid cream. Today's measurement show slight increase in size. Over the next 2 [name] we will transition to every other day Santyl and steroid cream. He has been encouraged to monitor for changes and notify clinic with any concerns 12/15/17 on evaluation today patient's left lateral motion the ulcer and fortunately is doing worse again at this point. This just since last week to this week has close to doubled in size according to the patient. I did not seeing last week's I do not have a visual to compare this to in our system was also down so we do not have all the charts and at this point. Nonetheless it does have me somewhat concerned in regard to  the fact that again he was worried enough about it he has contact the dermatology that placed them back on the full strength, 50 mg a day of the prednisone that he was taken previous. He continues to alternate using clobetasol along with Santyl at this point. He is obviously somewhat frustrated. 12/22/17 on evaluation today patient appears to be doing a little worse compared to last evaluation. Unfortunately the wound is a little deeper and slightly larger than the last week's evaluation. With that being said he has made some progress in regard to the irritation surrounding at this time unfortunately despite that progress that's been made he still has a significant issue going on here. I'm not certain that he is having really any true infection at this time although with the Pyoderma gangrenosum it can [name], Casey ([id number]) sometimes be difficult to differentiate infection versus just inflammation. For that reason I discussed with him today the possibility of perform a wound culture to ensure there's nothing overtly infected. 01/06/18 on evaluation today patient's wound is larger and deeper than previously evaluated. With that being  said it did appear that his wound was infected after my last evaluation with him. Subsequently I did end up prescribing a prescription for Bactrim DS which she has been taking and having no complication with. Fortunately there does not appear to be any evidence of infection at this point in time as far as anything spreading, no want to touch, and overall I feel like things are showing signs of improvement. 01/13/18 on evaluation today patient appears to be even a little larger and deeper than last time. There still muscle exposed in the base of the wound. Nonetheless he does appear to be less erythematous I do believe inflammation is calming down also believe the infection looks like it's probably resolved at this time based on what I'm seeing. No fevers, chills, nausea, or vomiting noted at this time. 01/30/18 on evaluation today patient actually appears to visually look better for the most part. Unfortunately those visually this looks better he does seem to potentially have what may be an abscess in the muscle that has been noted in the central portion of the wound. This is the first time that I have noted what appears to be fluctuance in the central portion of the muscle. With that being said I'm somewhat more concerned about the fact that this might indicate an abscess formation at this location. I do believe that an ultrasound would be appropriate. This is likely something we need to try to do as soon as possible. He has been switch to mupirocin ointment and he is no longer using the steroid ointment as prescribed by dermatology he sees them again next week he's been decreased from 60 to 40 mg of prednisone. 03/09/18 on evaluation today patient actually appears to be doing a little better compared to last time I saw him. There's not as much erythema surrounding the wound itself. He I did review his most recent infectious disease note which was dated 02/24/18. He saw Dr. Michel Bickers in  [place]. 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 Patient is now off of antibiotics and they are continually observing this. There seems to be no change in the past two [name] in my pinion based on what the patient says and what I see today compared to what Dr. Megan Salon likely saw two [name] ago. No  fevers, chills, nausea, or vomiting noted at this time. 03/23/18 on evaluation today patient's wound actually appears to be showing signs of improvement which is good news. He is currently still on the Dapsone. He is also working on tapering the prednisone to get off of this and Dr. Lottie Rater is working with him in this regard. Nonetheless overall I feel like the wound is doing well it does appear based on the infectious disease note that I reviewed from Dr. 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 I did also review the results of his ultrasound of left lower extremity which revealed there was a dentist tissue in the base of the wound without an abscess noted. 04/06/18 on evaluation today the patient's left lateral lower extremity ulcer actually appears to be doing fairly well which is excellent news. There does not appear to be any evidence of infection at this time which is also great news. Overall he still does have a significantly large ulceration although little by little he seems to be making progress. He is down to 10 mg a day of the prednisone. 04/20/18 on evaluation today patient actually appears to be doing excellent at this time in regard to his left lower extremity ulcer. He's making signs of good progress unfortunately this is taking much longer than we would really like to see but nonetheless he is making progress. Fortunately there does not appear to be any evidence of infection at this  time. No fevers, chills, nausea, or vomiting noted at this time. The patient has not been using the Santyl due to the cost he hadn't got in this field yet. He's mainly been using the antibiotic ointment topically. Subsequently he also tells me that he really has not been scrubbing in the shower I think this would be helpful again as I told him it doesn't have to be anything too aggressive to even make it believe just enough to keep it free of some of the loose slough and biofilm on the wound surface. 05/11/18 on evaluation today patient's wound appears to be making slow but sure progress in regard to the left lateral lower extremity ulcer. He is been tolerating the dressing changes without complication. Fortunately there does not appear to be any evidence of infection at this time. He is still just using triple antibiotic ointment along with clobetasol occasionally over the area. He never got the Santyl and really does not seem to intend to in my pinion. 06/01/18 on evaluation today patient appears to be doing a little better in regard to his left lateral lower extremity ulcer. He states that overall he does not feel like he is doing as well with the Dapsone as he did with the prednisone. Nonetheless he sees his dermatologist later today and is gonna talk to them about the possibility of going back on the prednisone. Overall again I believe that the wound would be better if you would utilize Santyl but he really does not seem to be interested in going back to the [place] at this point. He has been using triple antibiotic ointment. [name], [name] ([id number]) 06/15/18 on evaluation today patient's wound actually appears to be doing about the same at this point. Fortunately there is no signs of infection at this time. He has made slight improvements although he continues to not really want to clean the wound bed at this point. He states  that he just doesn't mess with it he doesn't want to cause any  problems with everything else he has going on. He has been on medication, antibiotics as prescribed by his dermatologist, for a staff infection of his lower extremities which is really drying out now and looking much better he tells me. Fortunately there is no sign of overall infection. 06/29/18 on evaluation today patient appears to be doing well in regard to his left lateral lower surety ulcer all things considering. Fortunately his staff infection seems to be greatly improved compared to previous. He has no signs of infection and this is drying up quite nicely. He is still the doxycycline for this is no longer on cental, Dapsone, or any of the other medications. His dermatologist has recommended possibility of an infusion but right now he does not want to proceed with that. 07/13/18 on evaluation today patient appears to be doing about the same in regard to his left lateral lower surety ulcer. Fortunately there's no signs of infection at this time which is great news. Unfortunately he still builds up a significant amount of Slough/biofilm of the surface of the wound he still is not really cleaning this as he should be appropriately. Again I'm able to easily with saline and gauze remove the majority of this on the surface which if you would do this at home would likely be a dramatic improvement for him as far as getting the area to improve. Nonetheless overall I still feel like he is making progress is just very slow. I think Santyl will be of benefit for him as well. Still he has not gotten this as of this point. 07/27/18 on evaluation today patient actually appears to be doing little worse in regards of the erythema around the periwound region of the wound he also tells me that he's been having more drainage currently compared to what he was experiencing last time I saw him. He states not quite as bad as what he had because this was infected previously but nonetheless is still appears to be doing  poorly. Fortunately there is no evidence of systemic infection at this point. The patient tells me that he is not going to be able to afford the Santyl. He is still waiting to hear about the infusion therapy with his dermatologist. Apparently she wants an updated colonoscopy first. 08/10/18 on evaluation today patient appears to be doing better in regard to his left lateral lower extremity ulcer. Fortunately he is showing signs of improvement in this regard he's actually been approved for Remicade infusion's as well although this has not been scheduled as of yet. Fortunately there's no signs of active infection at this time in regard to the wound although he is having some issues with infection of the right lower extremity is been seen as dermatologist for this. Fortunately they are definitely still working with him trying to keep things under control. 09/07/18 on evaluation today patient is actually doing rather well in regard to his left lateral lower extremity ulcer. He notes these actually having some hair grow back on his extremity which is something he has not seen in years. He also tells me that the pain is really not giving them any trouble at this time which is also good news overall she is very pleased with the progress he's using a combination of the mupirocin along with the probate is all mixed. 09/21/18 on evaluation today patient actually appears to be doing fairly well all things considered in regard  to his looks from the ulcer. He's been tolerating the dressing changes without complication. Fortunately there's no signs of active infection at this time which is good news he is still on all antibiotics or prevention of the staff infection. He has been on prednisone for time although he states it is gonna contact his dermatologist and see if she put them on a short course due to some irritation that he has going on currently. Fortunately there's no evidence of any overall worsening this is  going very slow I think cental would be something that would be helpful for him although he states that $50 for tube is quite expensive. He therefore is not willing to get that at this point. 10/06/18 on evaluation today patient actually appears to be doing decently well in regard to his left lateral leg ulcer. He's been tolerating the dressing changes without complication. Fortunately there's no signs of active infection at this time. Overall I'm actually rather pleased with the progress he's making although it's slow he doesn't show any signs of infection and he does seem to be making some improvement. I do believe that he may need a switch up and dressings to try to help this to heal more appropriately and quickly. 10/19/18 on evaluation today patient actually appears to be doing better in regard to his left lateral lower extremity ulcer. This is shown signs of having much less Slough buildup at this point due to the fact he has been using the [employer]. Obviously this is very good news. The overall size of the wound is not dramatically smaller but again the appearance is. 11/02/18 on evaluation today patient actually appears to be doing quite well in regard to his lower [place] ulcer. A lot of the skin around the ulcer is actually somewhat irritating at this point this seems to be more due to the dressing causing irritation from the adhesive that anything else. Fortunately there is no signs of active infection at this time. 11/24/18 on evaluation today patient appears to be doing a little worse in regard to his overall appearance of his lower extremity [name], [name] ([id number]) ulcer. There's more erythema and warmth around the wound unfortunately. He is currently on doxycycline which he has been on for some time. With that being said I'm not sure that seems to be helping with what appears to possibly be an acute cellulitis with regard to his left lower extremity ulcer. No fevers, chills, nausea, or  vomiting noted at this time. 12/08/18 on evaluation today patient's wounds actually appears to be doing significantly better compared to his last evaluation. He has been using Santyl along with alternating tripling about appointment as well as the steroid cream seems to be doing quite well and the wound is showing signs of improvement which is excellent news. Fortunately there's no evidence of infection and in fact his culture came back negative with only normal skin flora noted. 12/21/2018 upon evaluation today patient actually appears to be doing excellent with regard to his ulcer. This is actually the best that I have seen it since have been helping to take care of him. It is both smaller as well as less slough noted on the surface of the wound and seems to be showing signs of good improvement with new skin growing from the edges. He has been using just the triamcinolone he does wonder if he can get a refill of that ointment today. 01/04/2019 upon evaluation today patient actually appears to be doing well with regard  to his left lateral lower extremity ulcer. With that being said it does not appear to be that he is doing quite as well as last time as far as progression is concerned. There does not appear to be any signs of infection or significant irritation which is good news. With that being said I do believe that he may benefit from switching to a collagen based dressing based on how clean The wound appears. 01/18/2019 on evaluation today patient actually appears to be doing well with regard to his wound on the left lower extremity. He is not made a lot of progress compared to where we were previous but nonetheless does seem to be doing okay at this time which is good news. There is no signs of active infection which is also good news. My only concern currently is I do wish we can get him into utilizing the collagen dressing his insurance would not pay for the supplies that we ordered although  it appears that he may be able to order this through his supply company that he typically utilizes. This is [employer]. Nonetheless he did try to order it during the office visit today and it appears this did go through. We will see if he can get that it is a different brand but nonetheless he has collagen and I do think will be beneficial. 02/01/2019 on evaluation today patient actually appears to be doing a little worse today in regard to the overall size of his wounds. Fortunately there is no signs of active infection at this time. That is visually. Nonetheless when this is happened before it was due to infection. For that reason were somewhat concerned about that this time as well. 02/08/2019 on evaluation today patient unfortunately appears to be doing slightly worse with regard to his wound upon evaluation today. Is measuring a little deeper and a little larger unfortunately. I am not really sure exactly what is causing this to enlarge he actually did see his dermatologist she is going to see about initiating Humira for him. Subsequently she also did do steroid injections into the wound itself in the periphery. Nonetheless still nonetheless he seems to be getting a little bit larger he is gone back to just using the steroid cream topically which I think is appropriate. I would say hold off on the collagen for the time being is definitely a good thing to do. Based on the culture results which we finally did get the final result back regarding it shows staph as the bacteria noted again that can be a normal skin bacteria based on the fact however he is having increased drainage and worsening of the wound measurement wise I would go ahead and place him on an antibiotic today I do believe for this. 02/15/2019 on evaluation today patient actually appears to be doing somewhat better in regard to his ulcer. There is no signs of worsening at this time I did review his culture results which showed evidence  of Staphylococcus aureus but not MRSA. Again this could just be more related to the normal skin bacteria although he states the drainage has slowed down quite a bit he may have had a mild infection not just colonization. And was much smaller and then since around10/04/2019 on evaluation today patient appears to be doing unfortunately worse as far as the size of the wound. I really feel like that this is steadily getting larger again it had been doing excellent right at the beginning of September we have seen  a steady increase in the area of the wound it is almost 2-1/2 times the size it was on September 1. Obviously this is a bad trend this is not wanting to see. For that reason we went back to using just the topical triamcinolone cream which does seem to help with inflammation. I checked him for bacteria by way of culture and nothing showed positive there. I am considering giving him a short course of a tapering steroid Dosepak today to see if that is can be beneficial for him. The patient is in agreement with giving that a try. 03/08/2019 on evaluation today patient appears to be doing very well in comparison to last evaluation with regard to his lower extremity ulcer. This is showing signs of less inflammation and actually measuring slightly smaller compared to last time every other week over the past month and a half he has been measuring larger larger larger. Nonetheless I do believe that the issue has been inflammation the prednisone does seem to have been beneficial for him which is good news. No fevers, chills, nausea, vomiting, or diarrhea. [name], [name] ([id number]) 03/22/2019 on evaluation today patient appears to be doing about the same with regard to his leg ulcer. He has been tolerating the dressing changes without complication. With that being said the wound seems to be mostly arrested at its current size but really is not making any progress except for when we prescribed the  prednisone. He did show some signs of dropping as far as the overall size of the wound during that interval week. Nonetheless this is something he is not on long-term at this point and unfortunately I think he is getting need either this or else the Humira which his dermatologist has discussed try to get approval for. With that being said he will be seeing his dermatologist on the 11th of this month that is November. 04/19/2019 on evaluation today patient appears to be doing really about the same the wound is measuring slightly larger compared to last time I saw him. He has not been into the office since November 2 due to the fact that he unfortunately had Covid as that his entire family. He tells me that it was rough but they did pull-through and he seems to be doing much better. Fortunately there is no signs of active infection at this time. No fevers, chills, nausea, vomiting, or diarrhea. 05/10/2019 on evaluation today patient unfortunately appears to be doing significantly worse as compared to last time I saw him. He does tell me that he has had his first dose of Humira and actually is scheduled to get the next one in the upcoming week. With that being said he tells me also that in the past several days he has been having a lot of issues with green drainage she showed me a picture this is more blue-green in color. He is also been having issues with increased sloughy buildup and the wound does appear to be larger today. Obviously this is not the direction that we want everything to take based on the starting of his Humira. Nonetheless I think this is definitely a result of likely infection and to be honest I think this is probably Pseudomonas causing the infection based on what I am seeing. Patient History Information obtained from Patient. Family History Diabetes - Mother,Maternal Grandparents, Heart Disease - Mother,Maternal Grandparents, Hereditary Spherocytosis - Siblings, Hypertension -  Maternal Grandparents, Stroke - Siblings, No family history of Cancer, Kidney Disease, Lung Disease, Seizures, Thyroid  Problems, Tuberculosis. Social History Current some day smoker, Marital Status - Married, Alcohol Use - Rarely, Drug Use - No History, Caffeine Use - Moderate. Medical History Eyes Denies history of Cataracts, Glaucoma, Optic Neuritis Ear/Nose/Mouth/Throat Denies history of Chronic sinus problems/congestion, Middle ear problems Hematologic/Lymphatic Denies history of Anemia, Hemophilia, Human Immunodeficiency Virus, Lymphedema, Sickle Cell Disease Respiratory Patient has history of Sleep Apnea - cpap Denies history of Aspiration, Asthma, Chronic Obstructive Pulmonary Disease (COPD), Pneumothorax Cardiovascular Patient has history of Hypertension Denies history of Angina, Arrhythmia, Congestive Heart Failure, Coronary Artery Disease, Deep Vein Thrombosis, Hypotension, Myocardial Infarction, Peripheral Arterial Disease, Peripheral Venous Disease, Phlebitis, Vasculitis Gastrointestinal Patient has history of Colitis Endocrine Denies history of Type I Diabetes, Type II Diabetes Genitourinary Denies history of End Stage Renal Disease Immunological Denies history of Lupus Erythematosus, Raynaud s, Scleroderma Integumentary (Skin) Denies history of History of Burn, History of pressure wounds Musculoskeletal Denies history of Gout, Rheumatoid Arthritis, Osteoarthritis, Osteomyelitis Neurologic Denies history of Dementia, Neuropathy, Quadriplegia, Paraplegia, Seizure Disorder [name], [name] ([id number]) Oncologic Denies history of Received Chemotherapy, Received Radiation Psychiatric Denies history of Anorexia/bulimia, Confinement Anxiety Review of Systems (ROS) Constitutional Symptoms (General Health) Denies complaints or symptoms of Fatigue, Fever, Chills, Marked Weight Change. Respiratory Denies complaints or symptoms of Chronic or frequent coughs, Shortness of  Breath. Cardiovascular Denies complaints or symptoms of Chest pain, LE edema. Psychiatric Denies complaints or symptoms of Anxiety, Claustrophobia. Objective Constitutional Well-nourished and well-hydrated in no acute distress. Vitals Time Taken: 8:12 AM, Height: 71 in, Weight: 338 lbs, BMI: 47.1, Temperature: 99.2 F, Pulse: 76 bpm, Respiratory Rate: 18 breaths/min, Blood Pressure: 130/79 mmHg. Respiratory normal breathing without difficulty. clear to auscultation bilaterally. Cardiovascular regular rate and rhythm with normal S1, S2. Psychiatric this patient is able to make decisions and demonstrates good insight into disease process. Alert and Oriented x 3. pleasant and cooperative. General Notes: Upon inspection patient's wound bed showed increased sloughy buildup along with erythema surrounding the wound. Really was not too terribly hot to touch but nonetheless I do feel like this is evidence of infection based on what I am seeing. He does have blue-green drainage which is consistent with Pseudomonas as well. Nonetheless I believe that Levaquin would probably be a good option for him. Integumentary (Hair, Skin) Wound #1 status is Open. Original cause of wound was Gradually Appeared. The wound is located on the Left,Lateral Lower Leg. The wound measures 4.8cm length x 4.2cm width x 0.6cm depth; 15.834cm^2 area and 9.5cm^3 volume. There is Fat Layer (Subcutaneous Tissue) Exposed exposed. There is no tunneling or undermining noted. There is a medium amount of serous drainage noted. The wound margin is distinct with the outline attached to the wound base. There is no granulation within the wound bed. There is a large (67-100%) amount of necrotic tissue within the wound bed including Adherent Slough. 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 Anesthetic (add to Medication List): Wound #1 Left,Lateral Lower Leg: Topical Lidocaine 4% cream applied to wound bed prior to debridement (In Clinic Only). Primary Wound Dressing: Wound #1 Left,Lateral Lower Leg: Other: - TCA to wound bed only Secondary Dressing: Wound #1 Left,Lateral Lower Leg: Boardered Foam Dressing Dressing Change Frequency: Wound #1 Left,Lateral Lower Leg: Change dressing every other day. Follow-up Appointments: Wound #1 Left,Lateral Lower Leg: Return Appointment in 2 [name]. Edema Control:  Wound #1 Left,Lateral Lower Leg: Elevate legs to the level of the heart and pump ankles as often as possible Laboratory ordered were: Wound culture routine - Left Lateral Lower Leg 1. Based on what I am seeing I do believe the patient would benefit from the utilization of Levaquin for a short-term 10-day course. There was a potential interaction with the Effexor although I think for short-term this will be fine he was advised that if he is having any issues with a regular heart rate or his heart feel like it is racing he should stop the medication. He is in agreement with this plan. 2. I am also going to recommend at this point he continue with the triamcinolone which should help with again inflammation I believe the reason this is larger is not due to any of the medicines he is put on but rather due to infection. 3. Obviously is good to continue with the Humira there is really no interaction I can find between utilization of the Levaquin and the Humira. We will see patient back for reevaluation in 1 week here in the clinic. If anything worsens or changes patient will contact our office for additional recommendations. Electronic Signature(s) Signed: 05/10/2019 10:05:23 AM By: Hedy Camara, Reyaan ([id number]) Entered By: Worthy [name] on 05/10/2019  10:05:23 TRAYVEN, LUMADUE ([id number]) -------------------------------------------------------------------------------- ROS/PFSH Details Patient Name: [name] Date of Service: 05/10/2019 8:00 AM Medical Record Number: [id number] Patient Account Number: [id number] Date of Birth/Sex: 1980-11-16 (38 y.o. M) Treating RN: [name] Primary Care Provider: Alma [name] Other Clinician: Referring Provider: Alma [name] Treating Provider/Extender: [name], Biagio Snelson [name] in Treatment: 126 Information Obtained From Patient Constitutional Symptoms (General Health) Complaints and Symptoms: Negative for: Fatigue; Fever; Chills; Marked Weight Change Respiratory Complaints and Symptoms: Negative for: Chronic or frequent coughs; Shortness of Breath Medical History: Positive for: Sleep Apnea - cpap Negative for: Aspiration; Asthma; Chronic Obstructive Pulmonary Disease (COPD); Pneumothorax Cardiovascular Complaints and Symptoms: Negative for: Chest pain; LE edema Medical History: Positive for: Hypertension Negative for: Angina; Arrhythmia; Congestive Heart Failure; Coronary Artery Disease; Deep Vein Thrombosis; Hypotension; Myocardial Infarction; Peripheral Arterial Disease; Peripheral Venous Disease; Phlebitis; Vasculitis Psychiatric Complaints and Symptoms: Negative for: Anxiety; Claustrophobia Medical History: Negative for: Anorexia/bulimia; Confinement Anxiety Eyes Medical History: Negative for: Cataracts; Glaucoma; Optic Neuritis Ear/Nose/Mouth/Throat Medical History: Negative for: Chronic sinus problems/congestion; Middle ear problems Hematologic/Lymphatic Medical History: Negative for: Anemia; Hemophilia; Human Immunodeficiency Virus; Lymphedema; Sickle Cell Disease [name], [name] ([id number]) Gastrointestinal Medical History: Positive for: Colitis Endocrine Medical History: Negative for: Type I Diabetes; Type II Diabetes Genitourinary Medical History: Negative  for: End Stage Renal Disease Immunological Medical History: Negative for: Lupus Erythematosus; Raynaudos; Scleroderma Integumentary (Skin) Medical History: Negative for: History of Burn; History of pressure wounds Musculoskeletal Medical History: Negative for: Gout; Rheumatoid Arthritis; Osteoarthritis; Osteomyelitis Neurologic Medical History: Negative for: Dementia; Neuropathy; Quadriplegia; Paraplegia; Seizure Disorder Oncologic Medical History: Negative for: Received Chemotherapy; Received Radiation Immunizations Pneumococcal Vaccine: Received Pneumococcal Vaccination: No Implantable Devices No devices added Family and Social History Cancer: No; Diabetes: Yes - Mother,Maternal Grandparents; Heart Disease: Yes - Mother,Maternal Grandparents; Hereditary Spherocytosis: Yes - Siblings; Hypertension: Yes - Maternal Grandparents; Kidney Disease: No; Lung Disease: No; Seizures: No; Stroke: Yes - Siblings; Thyroid Problems: No; Tuberculosis: No; Current some day smoker; Marital Status - Married; Alcohol Use: Rarely; Drug Use: No History; Caffeine Use: Moderate; Financial Concerns: No; Food, Clothing or Shelter Needs: No; Support System Lacking: No; Transportation Concerns: No Physician Affirmation I have reviewed and agree with  the above information. Electronic Signature(s) Signed: 05/10/2019 3:59:48 PM By: Kathreen Cosier ([id number]) Signed: 05/10/2019 6:06:28 PM By: Worthy Keeler PA-C Entered By: Worthy [name] on 05/10/2019 10:03:25 DONDRE, CATALFAMO ([id number]) -------------------------------------------------------------------------------- SuperBill Details Patient Name: [name] Date of Service: 05/10/2019 Medical Record Number: [id number] Patient Account Number: [id number] Date of Birth/Sex: [birth year] (38 y.o. M) Treating RN: [name] Primary Care Provider: Alma [name] Other Clinician: Referring Provider: Alma [name] Treating  Provider/Extender: [name], Taquita Demby [name] in Treatment: 126 Diagnosis Coding ICD-10 Codes Code Description [phone] Non-pressure chronic ulcer of left calf with fat layer exposed L88 Pyoderma gangrenosum I87.2 Venous insufficiency (chronic) (peripheral) Facility Procedures CPT4 Code: 78588502 Description: 77412 - WOUND CARE VISIT-LEV 2 EST PT Modifier: Quantity: 1 Physician Procedures CPT4 Code: [id number] Description: 20947 - WC PHYS LEVEL 4 - EST PT ICD-10 Diagnosis Description L97.222 Non-pressure chronic ulcer of left calf with fat layer expo L88 Pyoderma gangrenosum I87.2 Venous insufficiency (chronic) (peripheral) Modifier: sed Quantity: 1 Electronic Signature(s) Signed: 05/10/2019 10:05:33 AM By: Worthy Keeler PA-C Entered By: Worthy [name] on 05/10/2019 10:05:32

## 2019-05-10 NOTE — Progress Notes (Signed)
REYCE, LUBECK ([id number]) Visit Report for 05/10/2019 Arrival Information Details Patient Name: [name], [name] Date of Service: 05/10/2019 8:00 AM Medical Record Number: [id number] Patient Account Number: [id number] Date of Birth/Sex: 1980/07/30 (38 y.o. M) Treating RN: Montey Hora Primary Care Ruble Pumphrey: [name] Other Clinician: Referring Christel Bai: [name] Treating Blessyn [place]/Extender: Melburn Hake, [name]: 126 Visit Information History Since Last Visit Added or deleted any medications: No Patient Arrived: Ambulatory Any new allergies or adverse reactions: No Arrival Time: 08:10 Had a fall or experienced change in No Accompanied By: self activities of daily living that may affect Transfer Assistance: None risk of falls: Patient Identification Verified: Yes Signs or symptoms of abuse/neglect since last visito No Secondary Verification Process Completed: Yes Hospitalized since last visit: No Patient Requires Transmission-Based No Implantable device outside of the clinic excluding No Precautions: cellular tissue based products placed in the center Patient Has Alerts: No since last visit: Has Dressing in Place as Prescribed: Yes Pain Present Now: No Electronic Signature(s) Signed: 05/10/2019 4:13:29 PM By: Montey Hora Entered By: Montey Hora on 05/10/2019 08:10:31 [name] ([id number]) -------------------------------------------------------------------------------- Clinic Level of Care Assessment Details Patient Name: [name] Date of Service: 05/10/2019 8:00 AM Medical Record Number: [id number] Patient Account Number: [id number] Date of Birth/Sex: 1980/10/25 (38 y.o. M) Treating RN: Harold Barban Primary Care Vonn Sliger: [name] Other Clinician: Referring Deneshia Zucker: [name] Treating Lyan Holck/Extender: Melburn Hake, [name]: 126 Clinic Level of Care Assessment Items TOOL 4 Quantity Score []  - Use  when only an EandM is performed on FOLLOW-UP visit 0 ASSESSMENTS - Nursing Assessment / Reassessment X - Reassessment of Co-morbidities (includes updates in patient status) 1 10 X- 1 5 Reassessment of Adherence to Treatment Plan ASSESSMENTS - Wound and Skin Assessment / Reassessment X - Simple Wound Assessment / Reassessment - one wound 1 5 []  - 0 Complex Wound Assessment / Reassessment - multiple wounds []  - 0 Dermatologic / Skin Assessment (not related to wound area) ASSESSMENTS - Focused Assessment []  - Circumferential Edema Measurements - multi extremities 0 []  - 0 Nutritional Assessment / Counseling / Intervention []  - 0 Lower Extremity Assessment (monofilament, tuning fork, pulses) []  - 0 Peripheral Arterial Disease Assessment (using hand held doppler) ASSESSMENTS - Ostomy and/or Continence Assessment and Care []  - Incontinence Assessment and Management 0 []  - 0 Ostomy Care Assessment and Management (repouching, etc.) PROCESS - Coordination of Care []  - Simple Patient / Family Education for ongoing care 0 []  - 0 Complex (extensive) Patient / Family Education for ongoing care []  - 0 Staff obtains Programmer, systems, Records, Test Results / Process Orders []  - 0 Staff telephones HHA, Nursing Homes / Clarify orders / etc []  - 0 Routine Transfer to another Facility (non-emergent condition) []  - 0 Routine Hospital Admission (non-emergent condition) []  - 0 New Admissions / Biomedical engineer / Ordering NPWT, Apligraf, etc. []  - 0 Emergency Hospital Admission (emergent condition) X- 1 10 Simple Discharge Coordination Mezquita, Nowell ([id number]) []  - 0 Complex (extensive) Discharge Coordination PROCESS - Special Needs []  - Pediatric / Minor Patient Management 0 []  - 0 Isolation Patient Management []  - 0 Hearing / Language / Visual special needs []  - 0 Assessment of Community assistance (transportation, D/C planning, etc.) []  - 0 Additional assistance / Altered  mentation []  - 0 Support Surface(s) Assessment (bed, cushion, seat, etc.) INTERVENTIONS - Wound Cleansing / Measurement X - Simple Wound Cleansing - one wound 1 5 []  - 0 Complex Wound Cleansing - multiple wounds X- 1  5 Wound Imaging (photographs - any number of wounds) []  - 0 Wound Tracing (instead of photographs) X- 1 5 Simple Wound Measurement - one wound []  - 0 Complex Wound Measurement - multiple wounds INTERVENTIONS - Wound Dressings X - Small Wound Dressing one or multiple wounds 1 10 []  - 0 Medium Wound Dressing one or multiple wounds []  - 0 Large Wound Dressing one or multiple wounds X- 1 5 Application of Medications - topical []  - 0 Application of Medications - injection INTERVENTIONS - Miscellaneous []  - External ear exam 0 []  - 0 Specimen Collection (cultures, biopsies, blood, body fluids, etc.) X- 1 5 Specimen(s) / Culture(s) sent or taken to Lab for analysis []  - 0 Patient Transfer (multiple staff / Civil Service fast streamer / Similar devices) []  - 0 Simple Staple / Suture removal (25 or less) []  - 0 Complex Staple / Suture removal (26 or more) []  - 0 Hypo / Hyperglycemic Management (close monitor of Blood Glucose) []  - 0 Ankle / Brachial Index (ABI) - do not check if billed separately X- 1 5 Vital Signs Bond, Kerney ([id number]) Has the patient been seen at the hospital within the last three years: Yes Total Score: 70 Level Of Care: New/Established - Level 2 Electronic Signature(s) Signed: 05/10/2019 3:59:48 PM By: Harold Barban Entered By: Harold Barban on 05/10/2019 08:33:33 [name] ([id number]) -------------------------------------------------------------------------------- Encounter Discharge Information Details Patient Name: [name] Date of Service: 05/10/2019 8:00 AM Medical Record Number: [id number] Patient Account Number: [id number] Date of Birth/Sex: [birth year] (38 y.o. M) Treating RN: Harold Barban Primary Care Estle Sabella: [name] Other Clinician: Referring Olayinka Gathers: [name] Treating Jocilynn Grade/Extender: Melburn Hake, [name]: 126 Encounter Discharge Information Items Discharge Condition: Stable Ambulatory Status: Ambulatory Discharge Destination: Home Transportation: Private Auto Accompanied By: self Schedule Follow-up Appointment: Yes Clinical Summary of Care: Electronic Signature(s) Signed: 05/10/2019 3:59:48 PM By: Harold Barban Entered By: Harold Barban on 05/10/2019 08:37:07 [name] ([id number]) -------------------------------------------------------------------------------- Lower Extremity Assessment Details Patient Name: [name] Date of Service: 05/10/2019 8:00 AM Medical Record Number: [id number] Patient Account Number: [id number] Date of Birth/Sex: [birth year] (38 y.o. M) Treating RN: Montey Hora Primary Care Raghav Verrilli: [name] Other Clinician: Referring Lynnley Doddridge: [name] Treating Terese Heier/Extender: STONE III, [name]: 126 Edema Assessment Assessed: [Left: No] [Right: No] Edema: [Left: Ye] [Right: s] Vascular Assessment Pulses: Dorsalis Pedis Palpable: [Left:Yes] Electronic Signature(s) Signed: 05/10/2019 4:13:29 PM By: Montey Hora Entered By: Montey Hora on 05/10/2019 08:13:49 Friberg, [name] ([id number]) -------------------------------------------------------------------------------- Multi Wound Chart Details Patient Name: [name] Date of Service: 05/10/2019 8:00 AM Medical Record Number: [id number] Patient Account Number: [id number] Date of Birth/Sex: 09-08-1980 (38 y.o. M) Treating RN: Harold Barban Primary Care Maralyn Witherell: [name] Other Clinician: Referring Pritesh Sobecki: [name] Treating Aishwarya Shiplett/Extender: Melburn Hake, [name]: 126 Vital Signs Height(in): 71 Pulse(bpm): 76 Weight(lbs): 338 Blood Pressure(mmHg): 130/79 Body Mass Index(BMI): 47 Temperature(F):  99.2 Respiratory Rate 18 (breaths/min): Photos: [N/A:N/A] Wound Location: Left Lower Leg - Lateral N/A N/A Wounding Event: Gradually Appeared N/A N/A Primary Etiology: Pyoderma N/A N/A Comorbid History: Sleep Apnea, Hypertension, N/A N/A Colitis Date Acquired: 11/18/2015 N/A N/A Weeks of Treatment: 126 N/A N/A Wound Status: Open N/A N/A Measurements L x W x D 4.8x4.2x0.6 N/A N/A (cm) Area (cm) : 15.834 N/A N/A Volume (cm) : 9.5 N/A N/A % Reduction in Area: -222.60% N/A N/A % Reduction in Volume: -141.90% N/A N/A Classification: Full Thickness With Exposed N/A N/A Support Structures Exudate Amount: Medium N/A N/A Exudate Type: Serous  N/A N/A Exudate Color: amber N/A N/A Wound Margin: Distinct, outline attached N/A N/A Granulation Amount: None Present (0%) N/A N/A Necrotic Amount: Large (67-100%) N/A N/A Exposed Structures: Fat Layer (Subcutaneous N/A N/A Tissue) Exposed: Yes Fascia: No Tendon: No Muscle: No Joint: No Bone: No Winkels, Namish ([id number]) Epithelialization: None N/A N/A Treatment Notes Electronic Signature(s) Signed: 05/10/2019 3:59:48 PM By: Harold Barban Entered By: Harold Barban on 05/10/2019 08:29:50 [name] ([id number]) -------------------------------------------------------------------------------- Multi-Disciplinary Care Plan Details Patient Name: [name] Date of Service: 05/10/2019 8:00 AM Medical Record Number: [id number] Patient Account Number: [id number] Date of Birth/Sex: Mar 09, 1981 (38 y.o. M) Treating RN: Harold Barban Primary Care Jacquita Mulhearn: [name] Other Clinician: Referring Maricarmen Braziel: [name] Treating Cena Bruhn/Extender: Melburn Hake, [name]: 62 Active Inactive Orientation to the Wound Care Program Nursing Diagnoses: Knowledge deficit related to the wound healing center program Goals: Patient/caregiver will verbalize understanding of the [facility] Program Date Initiated:  12/11/2016 Target Resolution Date: 03/13/2017 Goal Status: Active Interventions: Provide education on orientation to the wound center Notes: Venous Leg Ulcer Nursing Diagnoses: Knowledge deficit related to disease process and management Potential for venous Insuffiency (use before diagnosis confirmed) Goals: Non-invasive venous studies are completed as ordered Date Initiated: 12/11/2016 Target Resolution Date: 03/13/2017 Goal Status: Active Patient will maintain optimal edema control Date Initiated: 12/11/2016 Target Resolution Date: 03/13/2017 Goal Status: Active Patient/caregiver will verbalize understanding of disease process and disease management Date Initiated: 12/11/2016 Target Resolution Date: 03/13/2017 Goal Status: Active Verify adequate tissue perfusion prior to therapeutic compression application Date Initiated: 12/11/2016 Target Resolution Date: 03/13/2017 Goal Status: Active Interventions: Assess peripheral edema status every visit. Compression as ordered Provide education on venous insufficiency Treatment Activities: CLEARNCE, LEJA ([id number]) Therapeutic compression applied : 12/11/2016 Notes: Wound/Skin Impairment Nursing Diagnoses: Impaired tissue integrity Knowledge deficit related to ulceration/compromised skin integrity Goals: Patient/caregiver will verbalize understanding of skin care regimen Date Initiated: 12/11/2016 Target Resolution Date: 03/13/2017 Goal Status: Active Ulcer/skin breakdown will have a volume reduction of 30% by week 4 Date Initiated: 12/11/2016 Target Resolution Date: 03/13/2017 Goal Status: Active Ulcer/skin breakdown will have a volume reduction of 50% by week 8 Date Initiated: 12/11/2016 Target Resolution Date: 03/13/2017 Goal Status: Active Ulcer/skin breakdown will have a volume reduction of 80% by week 12 Date Initiated: 12/11/2016 Target Resolution Date: 03/13/2017 Goal Status: Active Ulcer/skin breakdown will heal within  14 weeks Date Initiated: 12/11/2016 Target Resolution Date: 03/13/2017 Goal Status: Active Interventions: Assess patient/caregiver ability to obtain necessary supplies Assess patient/caregiver ability to perform ulcer/skin care regimen upon admission and as needed Assess ulceration(s) every visit Provide education on ulcer and skin care Treatment Activities: Skin care regimen initiated : 12/11/2016 Topical wound management initiated : 12/11/2016 Notes: Electronic Signature(s) Signed: 05/10/2019 3:59:48 PM By: Harold Barban Entered By: Harold Barban on 05/10/2019 08:25:44 [name] ([id number]) -------------------------------------------------------------------------------- Pain Assessment Details Patient Name: [name] Date of Service: 05/10/2019 8:00 AM Medical Record Number: [id number] Patient Account Number: [id number] Date of Birth/Sex: 1981/02/22 (38 y.o. M) Treating RN: Montey Hora Primary Care Treniyah Lynn: [name] Other Clinician: Referring Abriella Filkins: [name] Treating Waynetta Metheny/Extender: Melburn Hake, [name]: 126 Active Problems Location of Pain Severity and Description of Pain Patient Has Paino Yes Site Locations Pain Location: Pain in Ulcers With Dressing Change: Yes Duration of the Pain. Constant / Intermittento Constant Character of Pain Describe the Pain: Aching, Other: stings Pain Management and Medication Current Pain Management: Electronic Signature(s) Signed: 05/10/2019 4:13:29 PM By: Montey Hora Entered By: Montey Hora on 05/10/2019  08:11:58 CRISTIN, SZATKOWSKI ([id number]) -------------------------------------------------------------------------------- Patient/Caregiver Education Details Patient Name: [name] Date of Service: 05/10/2019 8:00 AM Medical Record Number: [id number] Patient Account Number: [id number] Date of Birth/Gender: 1980/09/26 (38 y.o. M) Treating RN: Harold Barban Primary Care Physician:  [name] Other Clinician: Referring Physician: [name] Treating Physician/Extender: Sharalyn Ink in Treatment: 126 Education Assessment Education Provided To: Patient Education Topics Provided Infection: Handouts: Infection Prevention and Management Methods: Demonstration, Explain/Verbal Responses: State content correctly Wound/Skin Impairment: Handouts: Caring for Your Ulcer Methods: Demonstration, Explain/Verbal Responses: State content correctly Electronic Signature(s) Signed: 05/10/2019 3:59:48 PM By: Harold Barban Entered By: Harold Barban on 05/10/2019 08:30:31 [name] ([id number]) -------------------------------------------------------------------------------- Wound Assessment Details Patient Name: [name] Date of Service: 05/10/2019 8:00 AM Medical Record Number: [id number] Patient Account Number: [id number] Date of Birth/Sex: Aug 06, 1980 (38 y.o. M) Treating RN: Montey Hora Primary Care Demarus Latterell: [name] Other Clinician: Referring Camreigh Michie: [name] Treating Lalaine Overstreet/Extender: Melburn Hake, [name]: 126 Wound Status Wound Number: 1 Primary Etiology: Pyoderma Wound Location: Left Lower Leg - Lateral Wound Status: Open Wounding Event: Gradually Appeared Comorbid History: Sleep Apnea, Hypertension, Colitis Date Acquired: 11/18/2015 Weeks Of Treatment: 126 Clustered Wound: No Photos Wound Measurements Length: (cm) 4.8 Width: (cm) 4.2 Depth: (cm) 0.6 Area: (cm) 15.834 Volume: (cm) 9.5 % Reduction in Area: -222.6% % Reduction in Volume: -141.9% Epithelialization: None Tunneling: No Undermining: No Wound Description Full Thickness With Exposed Support Foul Odor Classification: Structures Slough/Fib Wound Margin: Distinct, outline attached Exudate Medium Amount: Exudate Type: Serous Exudate Color: amber After Cleansing: No rino Yes Wound Bed Granulation Amount: None Present (0%)  Exposed Structure Necrotic Amount: Large (67-100%) Fascia Exposed: No Necrotic Quality: Adherent Slough Fat Layer (Subcutaneous Tissue) Exposed: Yes Tendon Exposed: No Muscle Exposed: No Joint Exposed: No Bone Exposed: No Perdomo, Rishit ([id number]) Treatment Notes Wound #1 (Left, Lateral Lower Leg) Notes TCA to wound bed only, BFD Electronic Signature(s) Signed: 05/10/2019 4:13:29 PM By: Montey Hora Entered By: Montey Hora on 05/10/2019 08:17:41 [name] ([id number]) -------------------------------------------------------------------------------- [place] Details Patient Name: [name] Date of Service: 05/10/2019 8:00 AM Medical Record Number: [id number] Patient Account Number: [id number] Date of Birth/Sex: October 24, 1980 (38 y.o. M) Treating RN: Montey Hora Primary Care Kenlee Maler: [name] Other Clinician: Referring Sora Vrooman: [name] Treating Dustine Stickler/Extender: Melburn Hake, [name]: 126 Vital Signs Time Taken: 08:12 Temperature (F): 99.2 Height (in): 71 Pulse (bpm): 76 Weight (lbs): 338 Respiratory Rate (breaths/min): 18 Body Mass Index (BMI): 47.1 Blood Pressure (mmHg): 130/79 Reference Range: 80 - 120 mg / dl Electronic Signature(s) Signed: 05/10/2019 4:13:29 PM By: Montey Hora Entered By: Montey Hora on 05/10/2019 08:12:18

## 2019-05-11 ENCOUNTER — Other Ambulatory Visit
Admission: RE | Admit: 2019-05-11 | Discharge: 2019-05-11 | Disposition: A | Discharge status: Home / Self Care | Payer: 59 | Attending: Physician Assistant | Admitting: Physician Assistant

## 2019-05-11 DIAGNOSIS — L089 Local infection of the skin and subcutaneous tissue, unspecified: Secondary | ICD-10-CM | POA: Diagnosis present

## 2019-05-14 LAB — AEROBIC CULTURE W GRAM STAIN (SUPERFICIAL SPECIMEN)

## 2019-05-18 NOTE — Unmapped (Signed)
LVM needs appointment with [facility] in wound clinic; see Recall for info

## 2019-05-19 MED ORDER — PREDNISONE 10 MG TABLET
ORAL_TABLET | ORAL | 0 refills | 0.00000 days | Status: CP
Start: 2019-05-19 — End: ?

## 2019-05-19 NOTE — Unmapped (Signed)
Photos reviewed. 3 week prednisone taper sent in, may require longer however also have started humira. May require repeat biopsy if e/o new growth within ulcer. Recommend close f/up.

## 2019-05-24 ENCOUNTER — Encounter: Payer: 59 | Attending: Physician Assistant | Admitting: Physician Assistant

## 2019-05-24 ENCOUNTER — Other Ambulatory Visit: Payer: Self-pay

## 2019-05-24 DIAGNOSIS — K5190 Ulcerative colitis, unspecified, without complications: Secondary | ICD-10-CM | POA: Diagnosis not present

## 2019-05-24 DIAGNOSIS — L97222 Non-pressure chronic ulcer of left calf with fat layer exposed: Secondary | ICD-10-CM | POA: Insufficient documentation

## 2019-05-24 DIAGNOSIS — I872 Venous insufficiency (chronic) (peripheral): Secondary | ICD-10-CM | POA: Diagnosis not present

## 2019-05-24 DIAGNOSIS — L88 Pyoderma gangrenosum: Secondary | ICD-10-CM | POA: Insufficient documentation

## 2019-05-24 DIAGNOSIS — Z79899 Other long term (current) drug therapy: Secondary | ICD-10-CM | POA: Insufficient documentation

## 2019-05-24 DIAGNOSIS — Z7952 Long term (current) use of systemic steroids: Secondary | ICD-10-CM | POA: Insufficient documentation

## 2019-05-24 DIAGNOSIS — L03116 Cellulitis of left lower limb: Secondary | ICD-10-CM | POA: Insufficient documentation

## 2019-05-24 DIAGNOSIS — Z888 Allergy status to other drugs, medicaments and biological substances status: Secondary | ICD-10-CM | POA: Diagnosis not present

## 2019-05-24 DIAGNOSIS — F17210 Nicotine dependence, cigarettes, uncomplicated: Secondary | ICD-10-CM | POA: Insufficient documentation

## 2019-05-25 ENCOUNTER — Ambulatory Visit (INDEPENDENT_AMBULATORY_CARE_PROVIDER_SITE_OTHER): Payer: 59 | Admitting: Primary Care

## 2019-05-25 ENCOUNTER — Encounter: Payer: Self-pay | Admitting: Primary Care

## 2019-05-25 VITALS — BP 124/84 | HR 84 | Temp 97.0°F | Ht 71.0 in | Wt 340.5 lb

## 2019-05-25 DIAGNOSIS — L88 Pyoderma gangrenosum: Secondary | ICD-10-CM

## 2019-05-25 DIAGNOSIS — E119 Type 2 diabetes mellitus without complications: Secondary | ICD-10-CM | POA: Diagnosis not present

## 2019-05-25 DIAGNOSIS — F330 Major depressive disorder, recurrent, mild: Secondary | ICD-10-CM

## 2019-05-25 DIAGNOSIS — Z720 Tobacco use: Secondary | ICD-10-CM

## 2019-05-25 DIAGNOSIS — E785 Hyperlipidemia, unspecified: Secondary | ICD-10-CM

## 2019-05-25 LAB — COMPREHENSIVE METABOLIC PANEL
ALT: 46 U/L (ref 0–53)
AST: 23 U/L (ref 0–37)
Albumin: 4.7 g/dL (ref 3.5–5.2)
Alkaline Phosphatase: 135 U/L — ABNORMAL HIGH (ref 39–117)
BUN: 15 mg/dL (ref 6–23)
CO2: 25 mEq/L (ref 19–32)
Calcium: 9.6 mg/dL (ref 8.4–10.5)
Chloride: 92 mEq/L — ABNORMAL LOW (ref 96–112)
Creatinine, Ser: 1.16 mg/dL (ref 0.40–1.50)
GFR: 69.61 mL/min (ref >60.00)
Glucose, Bld: 373 mg/dL — ABNORMAL HIGH (ref 70–99)
Potassium: 4 mEq/L (ref 3.5–5.1)
Sodium: 130 mEq/L — ABNORMAL LOW (ref 135–145)
Total Bilirubin: 0.7 mg/dL (ref 0.2–1.2)
Total Protein: 7.8 g/dL (ref 6.0–8.3)

## 2019-05-25 LAB — LIPID PANEL
Cholesterol: 236 mg/dL — ABNORMAL HIGH (ref 0–200)
HDL: 38 mg/dL — ABNORMAL LOW (ref >39.00)
Total CHOL/HDL Ratio: 6
Triglycerides: 661 mg/dL — ABNORMAL HIGH (ref 0.0–149.0)

## 2019-05-25 LAB — POCT GLYCOSYLATED HEMOGLOBIN (HGB A1C): Hemoglobin A1C: 11.1 % — AB (ref 4.0–5.6)

## 2019-05-25 LAB — MICROALBUMIN / CREATININE URINE RATIO
Creatinine,U: 97.1 mg/dL
Microalb Creat Ratio: 8.4 mg/g (ref 0.0–30.0)
Microalb, Ur: 8.2 mg/dL — ABNORMAL HIGH (ref 0.0–1.9)

## 2019-05-25 LAB — LDL CHOLESTEROL, DIRECT: Direct LDL: 108 mg/dL

## 2019-05-25 MED ORDER — VENLAFAXINE HCL ER 37.5 MG PO CP24: 37.5000 mg | ORAL_CAPSULE | Freq: Every day | ORAL | 0 refills | Status: DC

## 2019-05-25 MED ORDER — BLOOD GLUCOSE MONITOR KIT: PACK | 0 refills | Status: AC

## 2019-05-25 MED ORDER — PEN NEEDLES 31G X 6 MM MISC: 2 refills | Status: DC

## 2019-05-25 MED ORDER — LANTUS SOLOSTAR 100 UNIT/ML ~~LOC~~ SOPN: 10.0000 [IU] | PEN_INJECTOR | Freq: Every day | SUBCUTANEOUS | 2 refills | Status: DC

## 2019-05-25 MED ORDER — METFORMIN HCL 1000 MG PO TABS: 1000.0000 mg | ORAL_TABLET | Freq: Two times a day (BID) | ORAL | 3 refills | Status: DC

## 2019-05-25 NOTE — Unmapped (Signed)
This encounter was created in error - please disregard.

## 2019-05-25 NOTE — Addendum Note (Signed)
Addended by: Jacqualin Combes on: 05/25/2019 11:47 AM   Modules accepted: Orders

## 2019-05-25 NOTE — Assessment & Plan Note (Signed)
Quit smoking in July 2020, commended him on this success.

## 2019-05-25 NOTE — Assessment & Plan Note (Signed)
Chronic, following with wound clinic and dermatology. Will work on aggressive diabetes treatment.

## 2019-05-25 NOTE — Assessment & Plan Note (Signed)
Overall doing well on venlafaxine but seems to need dose adjustment. Rx for 37.5 mg tablet sent to pharmacy to take in addition to 150 mg.   Follow up in 6 weeks.

## 2019-05-25 NOTE — Progress Notes (Signed)
Subjective:    Patient ID: [name], male    DOB: December 10, 1980, 39 y.o.   MRN: [id number]  HPI  [name] is a 39 year old male with a medical history of Ulcerative Colitis, type 2 diabetes, pyoderma gangrenosum, depression, hyperlipidemia, venous stasis, tobacco abuse who presents today with several issues.  1) Type 2 Diabetes:  Current medications include: None. Previously managed on Metformin but has not taken since October 2020.  He is currently on prednisone for a 14 day course, had a 14 day taper in November 2020 as well. He has noticed polyuria, polydipsia.  He's drinking 4-5 gallons of water daily, decreased appetite.   He is checking his blood glucose 0 times daily.  Last A1C: No recent on file Last Eye Exam: Due, completed in 2020 Last Foot Exam: Next visit Pneumonia Vaccination: Never completed, declines ACE/ARB: None, urine microalbumin pending Statin: None  BP Readings from Last 3 Encounters:  05/25/19 124/84  03/04/19 132/72  09/07/18 140/84   2) Depression: Currently managed on venlafaxine ER 150 mg for which he's taken for years. Overall doing okay on venlafaxine but will have bouts of anxiety several days weekly. Symptoms include feeling down/depressed, feeling overwhelmed with work and home. He quit smoking in July 2020 and finds it harder to control symptoms. He questions whether the dose is effective. He has noticed some suicidal thoughts, has no plans and contracts to safety.   3) Pyoderma gangrenosum: Chronic. Following with wound clinic through [facility] and dermatology through [facility]. Managed on prednisone now for 14 day taper, also managed on Humira every two weeks since December 2020, triamcinolone, doxycycline 100 twice daily.    Review of Systems  Respiratory: Negative for shortness of breath.   Cardiovascular: Negative for chest pain.  Endocrine: Positive for polydipsia and polyuria.  Skin:       Chronic wound  Neurological: Negative for dizziness and  numbness.  Psychiatric/Behavioral:       See HPI       Past Medical History:  Diagnosis Date  . Blood in stool   . Depression   . Elevated blood pressure   . Hyperlipidemia   . OSA (obstructive sleep apnea) 2014  . Seasonal allergies   . Ulcerative colitis ([place]) 03/05/2018     Social History   Socioeconomic History  . Marital status: Married    Spouse name: Not on file  . Number of children: Not on file  . Years of education: Not on file  . Highest education level: Not on file  Occupational History  . Not on file  Tobacco Use  . Smoking status: Former Smoker    Packs/day: 2.00    Years: 14.00    Pack years: 28.00    Types: Cigarettes    Quit date: 11/25/2018    Years since quitting: 0.4  . Smokeless tobacco: Former Systems developer    Quit date: 03/17/2015  Substance and Sexual Activity  . Alcohol use: Yes    Alcohol/week: 0.0 standard drinks    Comment: soical  . Drug use: Not on file  . Sexual activity: Not on file  Other Topics Concern  . Not on file  Social History Narrative   Married.   7 kids.   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   Enjoys target shooting.    Social Determinants of Health   Financial Resource Strain:   . Difficulty of Paying Living Expenses: Not on file  Food Insecurity:   . Worried About  Running Out of Food in the Last Year: Not on file  . Ran Out of Food in the Last Year: Not on file  Transportation Needs:   . Lack of Transportation (Medical): Not on file  . Lack of Transportation (Non-Medical): Not on file  Physical Activity:   . Days of Exercise per Week: Not on file  . Minutes of Exercise per Session: Not on file  Stress:   . Feeling of Stress : Not on file  Social Connections:   . Frequency of Communication with Friends and Family: Not on file  . Frequency of Social Gatherings with Friends and Family: Not on file  . Attends Religious Services: Not on file  . Active Member of Clubs or Organizations: Not on file  . Attends Theatre manager Meetings: Not on file  . Marital Status: Not on file  Intimate Partner Violence:   . Fear of Current or Ex-Partner: Not on file  . Emotionally Abused: Not on file  . Physically Abused: Not on file  . Sexually Abused: Not on file    Past Surgical History:  Procedure Laterality Date  . SEPTOPLASTY  2007  . WRIST SURGERY      Family History  Problem Relation Age of Onset  . Alcohol abuse Paternal Aunt   . Alcohol abuse Paternal Uncle   . Stroke Paternal Uncle   . Hyperlipidemia Maternal Grandfather   . Hypertension Maternal Grandfather   . Diabetes Maternal Grandfather   . Alcohol abuse Maternal Grandfather   . Multiple sclerosis Mother   . Dementia Mother     Allergies  Allergen Reactions  . Biaxin [Clarithromycin] Other (See Comments)    Causes colitis flares  . Milk-Related Compounds Hives    Current Outpatient Medications on File Prior to Visit  Medication Sig Dispense Refill  . acetaminophen (TYLENOL) 500 MG tablet Take 1,000 mg by mouth every 6 (six) hours as needed for headache (pain).    . cetirizine (ZYRTEC) 10 MG tablet Take 1 tablet (10 mg total) by mouth daily. 90 tablet 1  . clobetasol ointment (TEMOVATE) 4.08 % Apply 1 application topically 2 (two) times daily.     Marland Kitchen doxycycline (VIBRA-TABS) 100 MG tablet Take 100 mg by mouth 2 (two) times daily.     . fluticasone (FLONASE) 50 MCG/ACT nasal spray Place 1 spray into both nostrils 2 (two) times daily. 16 g 6  . HUMIRA PEN 40 MG/0.8ML PNKT Inject 1 Syringe into the skin every 14 (fourteen) days.    Marland Kitchen ibuprofen (ADVIL,MOTRIN) 200 MG tablet Take 400-600 mg by mouth every 6 (six) hours as needed for headache (pain).    . mupirocin ointment (BACTROBAN) 2 % Apply topically two (2) 60g times a day. Mix with clobetasol and apply twice daily    . predniSONE (DELTASONE) 10 MG tablet As directed    . venlafaxine XR (EFFEXOR-XR) 150 MG 24 hr capsule TAKE 1 CAPSULE BY MOUTH  DAILY WITH BREAKFAST 90 capsule 1    No current facility-administered medications on file prior to visit.    BP 124/84   Pulse 84   Temp (!) 97 F (36.1 C) (Temporal)   Ht 5' 11"  (1.803 m)   Wt (!) 340 lb 8 oz (154.4 kg)   SpO2 98%   BMI 47.49 kg/m    Objective:   Physical Exam  Constitutional: He appears well-nourished.  Cardiovascular: Normal rate and regular rhythm.  Respiratory: Effort normal and breath sounds normal.  Musculoskeletal:  Cervical back: Neck supple.  Skin: Skin is warm and dry.  Psychiatric: He has a normal mood and affect.           Assessment & Plan:

## 2019-05-25 NOTE — Assessment & Plan Note (Signed)
A1C today of 11.1 which is a drastic increase from prior. Discussed role of diabetes and delayed wound healing, also chronic complications of longstanding hyperglycemia.  Rx for Lantus 10 units sent to pharmacy. Rx for glucometer kit sent to pharmacy. Discussed best time to check glucose, recommended at least BID. Rx for Metformin 1000 mg sent to pharmacy.  Lipid panel pending, consider statin therapy. Urine microalbumin pending. Declines pneumonia vaccine.   Follow up in 6 weeks with glucose logs.

## 2019-05-25 NOTE — Patient Instructions (Signed)
We've increased the dose of your venlafaxine to 187.5. continue taking the 150 mg capsule, add in 37.5 mg capsule.  Start Lantus insulin. Inject 10 units at bedtime as discussed.  Start checking your blood sugar levels.  Appropriate times to check your blood sugar levels are:  -Before any meal (breakfast, lunch, dinner) -Two hours after any meal (breakfast, lunch, dinner) -Bedtime  Record your readings and notify me if you continue to consistently run at or above 200 after two weeks.  Start Metformin 1000 mg twice daily. Start with 1000 mg once daily for 1-2 weeks, then increase to 1000 mg twice daily.   Stop by the lab prior to leaving today. I will notify you of your results once received.   Please schedule a follow up visit for 6 weeks for diabetes and depression.   It was a pleasure to see you today!   Diabetes Mellitus and Nutrition, Adult When you have diabetes (diabetes mellitus), it is very important to have healthy eating habits because your blood sugar (glucose) levels are greatly affected by what you eat and drink. Eating healthy foods in the appropriate amounts, at about the same times every day, can help you:  Control your blood glucose.  Lower your risk of heart disease.  Improve your blood pressure.  Reach or maintain a healthy weight. Every person with diabetes is different, and each person has different needs for a meal plan. Your health care provider may recommend that you work with a diet and nutrition specialist (dietitian) to make a meal plan that is best for you. Your meal plan may vary depending on factors such as:  The calories you need.  The medicines you take.  Your weight.  Your blood glucose, blood pressure, and cholesterol levels.  Your activity level.  Other health conditions you have, such as heart or kidney disease. How do carbohydrates affect me? Carbohydrates, also called carbs, affect your blood glucose level more than any other type of  food. Eating carbs naturally raises the amount of glucose in your blood. Carb counting is a method for keeping track of how many carbs you eat. Counting carbs is important to keep your blood glucose at a healthy level, especially if you use insulin or take certain oral diabetes medicines. It is important to know how many carbs you can safely have in each meal. This is different for every person. Your dietitian can help you calculate how many carbs you should have at each meal and for each snack. Foods that contain carbs include:  Bread, cereal, rice, pasta, and crackers.  Potatoes and corn.  Peas, beans, and lentils.  Milk and yogurt.  Fruit and juice.  Desserts, such as cakes, cookies, ice cream, and candy. How does alcohol affect me? Alcohol can cause a sudden decrease in blood glucose (hypoglycemia), especially if you use insulin or take certain oral diabetes medicines. Hypoglycemia can be a life-threatening condition. Symptoms of hypoglycemia (sleepiness, dizziness, and confusion) are similar to symptoms of having too much alcohol. If your health care provider says that alcohol is safe for you, follow these guidelines:  Limit alcohol intake to no more than 1 drink per day for nonpregnant women and 2 drinks per day for men. One drink equals 12 oz of beer, 5 oz of wine, or 1 oz of hard liquor.  Do not drink on an empty stomach.  Keep yourself hydrated with water, diet soda, or unsweetened iced tea.  Keep in mind that regular soda, juice, and  other mixers may contain a lot of sugar and must be counted as carbs. What are tips for following this plan?  Reading food labels  Start by checking the serving size on the "Nutrition Facts" label of packaged foods and drinks. The amount of calories, carbs, fats, and other nutrients listed on the label is based on one serving of the item. Many items contain more than one serving per package.  Check the total grams (g) of carbs in one serving.  You can calculate the number of servings of carbs in one serving by dividing the total carbs by 15. For example, if a food has 30 g of total carbs, it would be equal to 2 servings of carbs.  Check the number of grams (g) of saturated and trans fats in one serving. Choose foods that have low or no amount of these fats.  Check the number of milligrams (mg) of salt (sodium) in one serving. Most people should limit total sodium intake to less than 2,300 mg per day.  Always check the nutrition information of foods labeled as "low-fat" or "nonfat". These foods may be higher in added sugar or refined carbs and should be avoided.  Talk to your dietitian to identify your daily goals for nutrients listed on the label. Shopping  Avoid buying canned, premade, or processed foods. These foods tend to be high in fat, sodium, and added sugar.  Shop around the outside edge of the grocery store. This includes fresh fruits and vegetables, bulk grains, fresh meats, and fresh dairy. Cooking  Use low-heat cooking methods, such as baking, instead of high-heat cooking methods like deep frying.  Cook using healthy oils, such as olive, canola, or sunflower oil.  Avoid cooking with butter, cream, or high-fat meats. Meal planning  Eat meals and snacks regularly, preferably at the same times every day. Avoid going long periods of time without eating.  Eat foods high in fiber, such as fresh fruits, vegetables, beans, and whole grains. Talk to your dietitian about how many servings of carbs you can eat at each meal.  Eat 4-6 ounces (oz) of lean protein each day, such as lean meat, chicken, fish, eggs, or tofu. One oz of lean protein is equal to: ? 1 oz of meat, chicken, or fish. ? 1 egg. ?  cup of tofu.  Eat some foods each day that contain healthy fats, such as avocado, nuts, seeds, and fish. Lifestyle  Check your blood glucose regularly.  Exercise regularly as told by your health care provider. This may  include: ? 150 minutes of moderate-intensity or vigorous-intensity exercise each week. This could be brisk walking, biking, or water aerobics. ? Stretching and doing strength exercises, such as yoga or weightlifting, at least 2 times a week.  Take medicines as told by your health care provider.  Do not use any products that contain nicotine or tobacco, such as cigarettes and e-cigarettes. If you need help quitting, ask your health care provider.  Work with a Social worker or diabetes educator to identify strategies to manage stress and any emotional and social challenges. Questions to ask a health care provider  Do I need to meet with a diabetes educator?  Do I need to meet with a dietitian?  What number can I call if I have questions?  When are the best times to check my blood glucose? Where to find more information:  American Diabetes Association: diabetes.org  Academy of Nutrition and Dietetics: www.eatright.[employer] of Diabetes and Digestive  and Kidney Diseases (NIH): [employer] Summary  A healthy meal plan will help you control your blood glucose and maintain a healthy lifestyle.  Working with a diet and nutrition specialist (dietitian) can help you make a meal plan that is best for you.  Keep in mind that carbohydrates (carbs) and alcohol have immediate effects on your blood glucose levels. It is important to count carbs and to use alcohol carefully. This information is not intended to replace advice given to you by your health care provider. Make sure you discuss any questions you have with your health care provider. Document Revised: 04/18/2017 Document Reviewed: 06/10/2016 Elsevier Patient Education  2020 [employer].

## 2019-05-25 NOTE — Assessment & Plan Note (Signed)
Repeat lipid panel pending. Consider statin therapy given diabetes.

## 2019-05-26 MED ORDER — ATORVASTATIN CALCIUM 40 MG PO TABS: 40.0000 mg | ORAL_TABLET | Freq: Every evening | ORAL | 3 refills | Status: DC

## 2019-05-27 NOTE — Progress Notes (Signed)
ZEIN, HELBING ([id number]) Visit Report for 05/24/2019 Arrival Information Details Patient Name: [name], [name] Date of Service: 05/24/2019 8:00 AM Medical Record Number: [id number] Patient Account Number: [id number] Date of Birth/Sex: 1981-02-15 (39 y.o. M) Treating RN: [name] Primary Care [name]: [name] Other Clinician: Referring [name]: [name] Treating [name]/Extender: [name], [name] in Treatment: 29 Visit Information History Since Last Visit Added or deleted any medications: No Patient Arrived: Ambulatory Any new allergies or adverse reactions: No Arrival Time: 08:08 Had a fall or experienced change in No Accompanied By: self activities of daily living that may affect Transfer Assistance: None risk of falls: Patient Identification Verified: Yes Signs or symptoms of abuse/neglect since last visito No Secondary Verification Process Completed: Yes Hospitalized since last visit: No Patient Requires Transmission-Based No Implantable device outside of the clinic excluding No Precautions: cellular tissue based products placed in the center Patient Has Alerts: No since last visit: Has Dressing in Place as Prescribed: Yes Pain Present Now: Yes Electronic Signature(s) Signed: 05/24/2019 4:34:19 PM By: [name] Entered By: [name] on 05/24/2019 08:08:57 [name] ([id number]) -------------------------------------------------------------------------------- Clinic Level of Care Assessment Details Patient Name: [name] Date of Service: 05/24/2019 8:00 AM Medical Record Number: [id number] Patient Account Number: [id number] Date of Birth/Sex: January 08, 1981 (39 y.o. M) Treating RN: [name] Primary Care Camryn Quesinberry: [name] Other Clinician: Referring [name]: [name] Treating [name]/Extender: [name], [name] in Treatment: 128 Clinic Level of Care Assessment Items TOOL 4 Quantity Score []  - Use when only  an EandM is performed on FOLLOW-UP visit 0 ASSESSMENTS - Nursing Assessment / Reassessment X - Reassessment of Co-morbidities (includes updates in patient status) 1 10 X- 1 5 Reassessment of Adherence to Treatment Plan ASSESSMENTS - Wound and Skin Assessment / Reassessment X - Simple Wound Assessment / Reassessment - one wound 1 5 []  - 0 Complex Wound Assessment / Reassessment - multiple wounds []  - 0 Dermatologic / Skin Assessment (not related to wound area) ASSESSMENTS - Focused Assessment []  - Circumferential Edema Measurements - multi extremities 0 []  - 0 Nutritional Assessment / Counseling / Intervention []  - 0 Lower Extremity Assessment (monofilament, tuning fork, pulses) []  - 0 Peripheral Arterial Disease Assessment (using hand held doppler) ASSESSMENTS - Ostomy and/or Continence Assessment and Care []  - Incontinence Assessment and Management 0 []  - 0 Ostomy Care Assessment and Management (repouching, etc.) PROCESS - Coordination of Care X - Simple Patient / Family Education for ongoing care 1 15 []  - 0 Complex (extensive) Patient / Family Education for ongoing care []  - 0 Staff obtains Programmer, systems, Records, Test Results / Process Orders []  - 0 Staff telephones HHA, Nursing Homes / Clarify orders / etc []  - 0 Routine Transfer to another Facility (non-emergent condition) []  - 0 Routine Hospital Admission (non-emergent condition) []  - 0 New Admissions / Biomedical engineer / Ordering NPWT, Apligraf, etc. []  - 0 Emergency Hospital Admission (emergent condition) X- 1 10 Simple Discharge Coordination [name], [name] ([id number]) []  - 0 Complex (extensive) Discharge Coordination PROCESS - Special Needs []  - Pediatric / Minor Patient Management 0 []  - 0 Isolation Patient Management []  - 0 Hearing / Language / Visual special needs []  - 0 Assessment of Community assistance (transportation, D/C planning, etc.) []  - 0 Additional assistance / Altered mentation []  -  0 Support Surface(s) Assessment (bed, cushion, seat, etc.) INTERVENTIONS - Wound Cleansing / Measurement X - Simple Wound Cleansing - one wound 1 5 []  - 0 Complex Wound Cleansing - multiple wounds X-  1 5 Wound Imaging (photographs - any number of wounds) []  - 0 Wound Tracing (instead of photographs) X- 1 5 Simple Wound Measurement - one wound []  - 0 Complex Wound Measurement - multiple wounds INTERVENTIONS - Wound Dressings []  - Small Wound Dressing one or multiple wounds 0 X- 1 15 Medium Wound Dressing one or multiple wounds []  - 0 Large Wound Dressing one or multiple wounds []  - 0 Application of Medications - topical []  - 0 Application of Medications - injection INTERVENTIONS - Miscellaneous []  - External ear exam 0 []  - 0 Specimen Collection (cultures, biopsies, blood, body fluids, etc.) []  - 0 Specimen(s) / Culture(s) sent or taken to Lab for analysis []  - 0 Patient Transfer (multiple staff / Civil Service fast streamer / Similar devices) []  - 0 Simple Staple / Suture removal (25 or less) []  - 0 Complex Staple / Suture removal (26 or more) []  - 0 Hypo / Hyperglycemic Management (close monitor of Blood Glucose) []  - 0 Ankle / Brachial Index (ABI) - do not check if billed separately X- 1 5 Vital Signs [name], [name] ([id number]) Has the patient been seen at the hospital within the last three years: Yes Total Score: 80 Level Of Care: New/Established - Level 3 Electronic Signature(s) Signed: 05/24/2019 4:45:12 PM By: [name] Entered By: [name] on 05/24/2019 08:37:43 [name] ([id number]) -------------------------------------------------------------------------------- Encounter Discharge Information Details Patient Name: [name] Date of Service: 05/24/2019 8:00 AM Medical Record Number: [id number] Patient Account Number: [id number] Date of Birth/Sex: May 26, 1980 (39 y.o. M) Treating RN: [name] Primary Care [name]: [name] Other  Clinician: Referring [name]: [name] Treating [name]/Extender: [name], [name] in Treatment: 128 Encounter Discharge Information Items Discharge Condition: Stable Ambulatory Status: Ambulatory Discharge Destination: Home Transportation: Private Auto Accompanied By: self Schedule Follow-up Appointment: Yes Clinical Summary of Care: Electronic Signature(s) Signed: 05/24/2019 4:45:12 PM By: [name] Entered By: [name] on 05/24/2019 08:44:36 [name] ([id number]) -------------------------------------------------------------------------------- Lower Extremity Assessment Details Patient Name: [name] Date of Service: 05/24/2019 8:00 AM Medical Record Number: [id number] Patient Account Number: [id number] Date of Birth/Sex: 12-10-1980 (39 y.o. M) Treating RN: [name] Primary Care Jaylei Fuerte: [name] Other Clinician: Referring Khalani Novoa: [name] Treating Becket Wecker/Extender: STONE III, [name] in Treatment: 128 Edema Assessment Assessed: [Left: No] [Right: No] Edema: [Left: Ye] [Right: s] Vascular Assessment Pulses: Dorsalis Pedis Palpable: [Left:Yes] Electronic Signature(s) Signed: 05/24/2019 4:34:19 PM By: [name] Entered By: [name] on 05/24/2019 08:16:44 [name] ([id number]) -------------------------------------------------------------------------------- Multi Wound Chart Details Patient Name: [name] Date of Service: 05/24/2019 8:00 AM Medical Record Number: [id number] Patient Account Number: [id number] Date of Birth/Sex: 16-Nov-1980 (39 y.o. M) Treating RN: [name] Primary Care Maily Debarge: [name] Other Clinician: Referring Margaretann Abate: [name] Treating Jemel Ono/Extender: [name], [name] in Treatment: 128 Vital Signs Height(in): 71 Pulse(bpm): 96 Weight(lbs): 338 Blood Pressure(mmHg): 155/92 Body Mass Index(BMI): 47 Temperature(F): 99.2 Respiratory  Rate 18 (breaths/min): Photos: [N/A:N/A] Wound Location: Left Lower Leg - Lateral N/A N/A Wounding Event: Gradually Appeared N/A N/A Primary Etiology: Pyoderma N/A N/A Comorbid History: Sleep Apnea, Hypertension, N/A N/A Colitis Date Acquired: 11/18/2015 N/A N/A [name] of Treatment: 128 N/A N/A Wound Status: Open N/A N/A Measurements L x W x D 5.5x4.8x0.7 N/A N/A (cm) Area (cm) : 20.735 N/A N/A Volume (cm) : 14.514 N/A N/A % Reduction in Area: -322.40% N/A N/A % Reduction in Volume: -269.60% N/A N/A Classification: Full Thickness With Exposed N/A N/A Support Structures Exudate Amount: Medium N/A N/A Exudate Type: Serous  N/A N/A Exudate Color: amber N/A N/A Wound Margin: Distinct, outline attached N/A N/A Granulation Amount: Small (1-33%) N/A N/A Granulation Quality: Pink N/A N/A Necrotic Amount: Large (67-100%) N/A N/A Exposed Structures: Fat Layer (Subcutaneous N/A N/A Tissue) Exposed: Yes Muscle: Yes Fascia: No Tendon: No Balsley, Akshith ([id number]) Joint: No Bone: No Epithelialization: None N/A N/A Treatment Notes Electronic Signature(s) Signed: 05/24/2019 4:45:12 PM By: [name] Entered By: [name] on 05/24/2019 08:35:07 [name] ([id number]) -------------------------------------------------------------------------------- Multi-Disciplinary Care Plan Details Patient Name: [name] Date of Service: 05/24/2019 8:00 AM Medical Record Number: [id number] Patient Account Number: [id number] Date of Birth/Sex: [birth year] (39 y.o. M) Treating RN: [name] Primary Care Shakirah Kirkey: [name] Other Clinician: Referring Ileane Sando: [name] Treating Hewitt Garner/Extender: [name], [name] in Treatment: 128 Active Inactive Orientation to the Wound Care Program Nursing Diagnoses: Knowledge deficit related to the wound healing center program Goals: Patient/caregiver will verbalize understanding of the [facility] Program Date  Initiated: 12/11/2016 Target Resolution Date: 03/13/2017 Goal Status: Active Interventions: Provide education on orientation to the wound center Notes: Venous Leg Ulcer Nursing Diagnoses: Knowledge deficit related to disease process and management Potential for venous Insuffiency (use before diagnosis confirmed) Goals: Non-invasive venous studies are completed as ordered Date Initiated: 12/11/2016 Target Resolution Date: 03/13/2017 Goal Status: Active Patient will maintain optimal edema control Date Initiated: 12/11/2016 Target Resolution Date: 03/13/2017 Goal Status: Active Patient/caregiver will verbalize understanding of disease process and disease management Date Initiated: 12/11/2016 Target Resolution Date: 03/13/2017 Goal Status: Active Verify adequate tissue perfusion prior to therapeutic compression application Date Initiated: 12/11/2016 Target Resolution Date: 03/13/2017 Goal Status: Active Interventions: Assess peripheral edema status every visit. Compression as ordered Provide education on venous insufficiency Treatment Activities: ABUBAKAR, CRISPO ([id number]) Therapeutic compression applied : 12/11/2016 Notes: Wound/Skin Impairment Nursing Diagnoses: Impaired tissue integrity Knowledge deficit related to ulceration/compromised skin integrity Goals: Patient/caregiver will verbalize understanding of skin care regimen Date Initiated: 12/11/2016 Target Resolution Date: 03/13/2017 Goal Status: Active Ulcer/skin breakdown will have a volume reduction of 30% by week 4 Date Initiated: 12/11/2016 Target Resolution Date: 03/13/2017 Goal Status: Active Ulcer/skin breakdown will have a volume reduction of 50% by week 8 Date Initiated: 12/11/2016 Target Resolution Date: 03/13/2017 Goal Status: Active Ulcer/skin breakdown will have a volume reduction of 80% by week 12 Date Initiated: 12/11/2016 Target Resolution Date: 03/13/2017 Goal Status: Active Ulcer/skin breakdown will  heal within 14 [name] Date Initiated: 12/11/2016 Target Resolution Date: 03/13/2017 Goal Status: Active Interventions: Assess patient/caregiver ability to obtain necessary supplies Assess patient/caregiver ability to perform ulcer/skin care regimen upon admission and as needed Assess ulceration(s) every visit Provide education on ulcer and skin care Treatment Activities: Skin care regimen initiated : 12/11/2016 Topical wound management initiated : 12/11/2016 Notes: Electronic Signature(s) Signed: 05/24/2019 4:45:12 PM By: [name] Entered By: [name] on 05/24/2019 08:34:59 [name] ([id number]) -------------------------------------------------------------------------------- Pain Assessment Details Patient Name: [name] Date of Service: 05/24/2019 8:00 AM Medical Record Number: [id number] Patient Account Number: [id number] Date of Birth/Sex: 23-Mar-1981 (39 y.o. M) Treating RN: [name] Primary Care Brayten Komar: [name] Other Clinician: Referring Bonnie Overdorf: [name] Treating Artin Mceuen/Extender: [name], [name] in Treatment: 128 Active Problems Location of Pain Severity and Description of Pain Patient Has Paino Yes Site Locations Pain Location: Pain in Ulcers With Dressing Change: Yes Duration of the Pain. Constant / Intermittento Intermittent Pain Management and Medication Current Pain Management: Goals for Pain Management c/o pain when he "hits a certain spot" on the wound Electronic Signature(s) Signed: 05/24/2019 4:34:19  PM By: [name] Entered By: [name] on 05/24/2019 08:10:14 [name] ([id number]) -------------------------------------------------------------------------------- Patient/Caregiver Education Details Patient Name: [name] Date of Service: 05/24/2019 8:00 AM Medical Record Number: [id number] Patient Account Number: [id number] Date of Birth/Gender: 12-11-1980 (39 y.o. M) Treating RN: [name] Primary Care Physician: [name] Other Clinician: Referring Physician: Alma [name] Treating Physician/Extender: Sharalyn Ink in Treatment: 128 Education Assessment Education Provided To: Patient Education Topics Provided Infection: Handouts: Infection Prevention and Management Methods: Demonstration, Explain/Verbal Responses: State content correctly Wound/Skin Impairment: Handouts: Caring for Your Ulcer Methods: Demonstration, Explain/Verbal Responses: State content correctly Electronic Signature(s) Signed: 05/24/2019 4:45:12 PM By: [name] Entered By: [name] on 05/24/2019 08:35:34 Vandivier, [name] ([id number]) -------------------------------------------------------------------------------- Wound Assessment Details Patient Name: [name] Date of Service: 05/24/2019 8:00 AM Medical Record Number: [id number] Patient Account Number: [id number] Date of Birth/Sex: 1980-05-30 (39 y.o. M) Treating RN: [name] Primary Care Arron Tetrault: [name] Other Clinician: Referring Armilda Vanderlinden: [name] Treating Irie Fiorello/Extender: [name], [name] in Treatment: 128 Wound Status Wound Number: 1 Primary Etiology: Pyoderma Wound Location: Left Lower Leg - Lateral Wound Status: Open Wounding Event: Gradually Appeared Comorbid History: Sleep Apnea, Hypertension, Colitis Date Acquired: 11/18/2015 [name] Of Treatment: 128 Clustered Wound: No Photos Wound Measurements Length: (cm) 5.5 Width: (cm) 4.8 Depth: (cm) 0.7 Area: (cm) 20.735 Volume: (cm) 14.514 % Reduction in Area: -322.4% % Reduction in Volume: -269.6% Epithelialization: None Tunneling: No Undermining: No Wound Description Full Thickness With Exposed Support Foul Odor A Classification: Structures Slough/Fibr Wound Margin: Distinct, outline attached Exudate Medium Amount: Exudate Type: Serous Exudate Color: amber fter Cleansing: No ino Yes Wound  Bed Granulation Amount: Small (1-33%) Exposed Structure Granulation Quality: Pink Fascia Exposed: No Necrotic Amount: Large (67-100%) Fat Layer (Subcutaneous Tissue) Exposed: Yes Necrotic Quality: Adherent Slough Tendon Exposed: No Muscle Exposed: Yes Necrosis of Muscle: No Joint Exposed: No Bone Exposed: No [place], [place] ([id number]) Treatment Notes Wound #1 (Left, Lateral Lower Leg) Notes Plain alginate, dry gauze, ABD, tape to secure Electronic Signature(s) Signed: 05/24/2019 4:34:19 PM By: [name] Entered By: [name] on 05/24/2019 08:16:31 [name] ([id number]) -------------------------------------------------------------------------------- [place] Details Patient Name: [name] Date of Service: 05/24/2019 8:00 AM Medical Record Number: [id number] Patient Account Number: [id number] Date of Birth/Sex: 02/09/1981 (39 y.o. M) Treating RN: [name] Primary Care Donicia Druck: [name] Other Clinician: Referring Khyre Germond: [name] Treating Fransheska Willingham/Extender: [name], [name] in Treatment: 128 Vital Signs Time Taken: 08:10 Temperature (F): 99.2 Height (in): 71 Pulse (bpm): 96 Weight (lbs): 338 Respiratory Rate (breaths/min): 18 Body Mass Index (BMI): 47.1 Blood Pressure (mmHg): 155/92 Reference Range: 80 - 120 mg / dl Electronic Signature(s) Signed: 05/24/2019 4:34:19 PM By: [name] Entered By: [name] on 05/24/2019 08:12:35

## 2019-05-27 NOTE — Progress Notes (Signed)
LORANCE, PICKERAL ([id number]) Visit Report for 05/24/2019 Chief Complaint Document Details Patient Name: [name], [name] Date of Service: 05/24/2019 8:00 AM Medical Record Number: [id number] Patient Account Number: [id number] Date of Birth/Sex: [birth year] (39 y.o. M) Treating RN: Harold Barban Primary Care Provider: [name] Other Clinician: Referring Provider: [name] Treating Provider/Extender: Melburn Hake, Kyndra Condron Weeks in Treatment: 128 Information Obtained from: Patient Chief Complaint He is here in follow up evaluation for LLE pyoderma ulcer Electronic Signature(s) Signed: 05/26/2019 6:44:22 PM By: Worthy Keeler PA-C Entered By: Worthy Keeler on 05/24/2019 08:21:54 [name] ([id number]) -------------------------------------------------------------------------------- HPI Details Patient Name: [name] Date of Service: 05/24/2019 8:00 AM Medical Record Number: [id number] Patient Account Number: [id number] Date of Birth/Sex: 09/24/1980 (39 y.o. M) Treating RN: Harold Barban Primary Care Provider: [name] Other Clinician: Referring Provider: [name] Treating Provider/Extender: Melburn Hake, Romin Divita Weeks in Treatment: 128 History of Present Illness HPI Description: 12/04/16; 39 year old man who comes into the clinic today for review of a wound on the posterior left calf. He tells me that is been there for about a year. He is not a diabetic he does smoke half a pack per day. He was seen in the ER on 11/20/16 felt to have cellulitis around the wound and was given clindamycin. An x-[name] did not show osteomyelitis. The patient initially tells me that he has a milk allergy that sets off a pruritic itching rash on his lower legs which she scratches incessantly and he thinks that's what may have set up the wound. He has been using various topical antibiotics and ointments without any effect. He works in a [employer] and is on his feet all day. He does not have a  prior history of wounds however he does have the rash on both lower legs the right arm and the ventral aspect of his left arm. These are excoriations and clearly have had scratching however there are of macular looking areas on both legs including a substantial larger area on the right leg. This does not have an underlying open area. There is no blistering. The patient tells me that 2 years ago in [place] in response to the rash on his legs he saw a dermatologist who told him he had a condition which may be pyoderma gangrenosum although I may be putting words into his mouth. He seemed to recognize this. 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 He is not in any treatment for this. He's had no recent travel 12/11/16; the patient arrives today with his wound and roughly the same condition we've been using silver alginate this is a deep punched out wound with some surrounding erythema but no tenderness. Biopsy I did did not show confirmed pyoderma gangrenosum suggested nonspecific inflammation and vasculitis but does not provide an actual description of what was seen by the pathologist. I'm really not able to understand this We have also received information from the patient's dermatologist in [place] notes from April 2016. This was a doctor Agarwal- antal. 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 He was prescribed topical steroid high potency under occlusion which helped but at this point the patient did not have a deep punched out wound. 12/18/16; the patient's wound is larger in terms of surface area however this surface looks better and there is less depth. The surrounding erythema also is better. The patient states that the wrap we put on came off 2 days ago when he has been using  his compression stockings. He we are in the process of getting a dermatology consult. 12/26/16 on evaluation today patient's left lower extremity wound shows evidence  of infection with surrounding erythema noted. He has been tolerating the dressing changes but states that he has noted more discomfort. There is a larger area of erythema surrounding the wound. No fevers, chills, nausea, or vomiting noted at this time. With that being said the wound still does have slough covering the surface. He is not allergic to any medication that he is aware of at this point. In regard to his right lower extremity he had several regions that are erythematous and pruritic he wonders if there's anything we can do to help that. 01/02/17 I reviewed patient's wound culture which was obtained his visit last week. He was placed on doxycycline at that point. Unfortunately that does not appear to be an antibiotic that would likely help with the situation however the pseudomonas noted on culture is sensitive to Cipro. Also unfortunately patient's wound seems to have a large compared to last week's evaluation. Not severely so but there are definitely increased measurements in general. He is continuing to have discomfort as well he writes this to be a seven out of 10. In fact he would prefer me not to perform any debridement today due to the fact that he is having discomfort and considering he has an active infection on the little reluctant to do so anyway. No fevers, chills, nausea, or vomiting noted at this time. 01/08/17; patient seems dermatology on September 5. I suspect dermatology will want the slides from the biopsy I did sent to their pathologist. I'm not sure if there is a way we can expedite that. In any case the culture I did before I left on vacation 3 weeks ago showed Pseudomonas he was given 10 days of Cipro and per her description of her intake nurses is actually somewhat better this week although the wound is quite a bit bigger than I remember the last time I saw this. He still has 3 more days of Cipro ASEEM, SESSUMS ([id number]) 01/21/17; dermatology appointment tomorrow. He  has completed the ciprofloxacin for Pseudomonas. Surface of the wound looks better however he is had some deterioration in the lesions on his right leg. Meantime the left lateral leg wound we will continue with sample 01/29/17; patient had his dermatology appointment but I can't yet see that note. He is completed his antibiotics. The wound is more superficial but considerably larger in circumferential area than when he came in. This is in his left lateral calf. He also has swollen erythematous areas with superficial wounds on the right leg and small papular areas on both arms. There apparently areas in her his upper thighs and buttocks I did not look at those. Dermatology biopsied the right leg. Hopefully will have their input next week. 02/05/17; patient went back to see his dermatologist who told him that he had a "scratching problem" as well as staph. He is now on a 30 day course of doxycycline and I believe she gave him triamcinolone cream to the right leg areas to help with the itching [not exactly sure but probably triamcinolone]. She apparently looked at the left lateral leg wound although this was not rebiopsied and I think felt to be ultimately part of the same pathogenesis. He is using sample border foam and changing nevus himself. He now has a new open area on the right posterior leg which was his biopsy site I don't  have any of the dermatology notes 02/12/17; we put the patient in compression last week with SANTYL to the wound on the left leg and the biopsy. Edema is much better and the depth of the wound is now at level of skin. Area is still the same oBiopsy site on the right lateral leg we've also been using santyl with a border foam dressing and he is changing this himself. 02/19/17; Using silver alginate started last week to both the substantial left leg wound and the biopsy site on the right wound. He is tolerating compression well. Has a an appointment with his primary M.D. tomorrow  wondering about diuretics although I'm wondering if the edema problem is actually lymphedema 02/26/17; the patient has been to see his primary doctor Dr. Jerrel Ivory at [facility] our primary care. She started him on Lasix 20 mg and this seems to have helped with the edema. However we are not making substantial change with the left lateral calf wound and inflammation. The biopsy site on the right leg also looks stable but not really all that different. 03/12/17; the patient has been to see vein and vascular Dr. Lucky Cowboy. He has had venous reflux studies I have not reviewed these. I did get a call from his dermatology office. 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 They therefore recommended clobetasol and tetracycline i.e. 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 03/26/17; apparently the patient just had reflux studies not an appointment with Dr. dew. She arrives in clinic today having applied clobetasol for 2-3 weeks. He notes over the last 2-3 days excessive drainage having to change the dressing 3-4 times a day and also expanding erythema. 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 Lillia Carmel at [facility] dermatology locally who kindly saw him at my request. 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 My concern was the degree of erythema medially extending from his left leg wound which was either inflammation from  pyoderma or cellulitis. I put him on Augmentin however culture of the wound showed Pseudomonas which is quinolone sensitive. I really don't believe he has cellulitis however in view of everything I will continue and give him a course of Cipro. He is also on doxycycline as an immune modulator for the pyoderma. In addition to his original wound on the left lateral leg with surrounding erythema he has a wound on the right posterior calf which was an original biopsy site done by dermatology. 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 Saw Dr. Lillia Carmel yesterday. He has been using topical antibiotics to both wound areas his original wound on the left and the biopsies/pathergy area on the right. There is definitely some improvement in the inflammation around the wound on the right although the patient states he has increasing sensitivity of the wounds. He is on prednisone 60 and doxycycline 1 as prescribed by Dr. Lillia Carmel. He is covering the topical  antibiotic with gauze and putting this in his own compression stocks and changing this daily. He states that Dr. Lottie Rater did a culture of the left leg wound yesterday 05/07/17; pyoderma gangrenosum. The patient saw Dr. Lillia Carmel yesterday and has a follow-up with her in one month. He is still using topical antibiotics to both wounds although he can't recall exactly what type. He is still on prednisone 60 mg. Dr. Lillia Carmel stated that the doxycycline could stop if we were in agreement. 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 The right medial leg was induced by biopsy/pathergy. The area on the right is essentially healed. 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 The right medial leg has closed and  remains closed. He is still on prednisone 60. oHe tells me he missed his last dermatology appointment with Dr. Lillia Carmel but will make another appointment. He reports that her blood sugar at a recent screen in [place] was high 200's. He was 180 today. He is more cushingoid blood pressure is Kulick, Matthan ([id number]) up a bit. I think he is going to require still much longer prednisone perhaps another 3 months before attempting to taper. In the meantime his wound is a lot better. Smaller. He is cleaning this off daily and applying topical antibiotics. When he was last in the clinic I thought about changing to [facility] and actually put in a couple of calls to dermatology although probably not during their business hours. 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 He continues on oral prednisone. He has been using triple antibiotic ointment. There is surface debris and we will transition to [facility] and have him return in 2 weeks. He has lost 30 pounds since his last appointment with lifestyle modification. He may benefit from topical steroid cream for treatment this can be considered at a later date. 08/22/17 on evaluation today patient appears to actually be doing rather well in regard to his left lateral lower extremity ulcer. He has actually been managed by Dr. Dellia Nims most recently. Patient is currently on oral steroids at this time. This seems to have been of benefit for him. Nonetheless his last visit was actually with Leah on 08/06/17. Currently he is not utilizing any topical steroid creams although this could be of benefit as well. No fevers, chills, nausea, or vomiting noted at this time. 09/05/17 on evaluation today patient appears to be doing better in regard to his left lateral lower extremity ulcer. He has been tolerating the dressing changes without complication.  He is using Santyl with good effect. Overall I'm very pleased with how things are standing at this point. Patient likewise is happy that this is doing better. 09/19/17 on evaluation today patient actually appears to be doing rather well in regard to his left lateral lower extremity ulcer. 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 He's not having any significant pain. 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 He has been tolerating the Santyl without complication and in general I feel like he's making good  progress. 10/17/17 on evaluation today patient appears to be doing very well in regard to his left lateral lower surety ulcer. He has been tolerating the dressing changes without complication. There does not appear to be any evidence of infection he's alternating the Santyl and the triple antibiotic ointment every other day this seems to be doing well for him. 11/03/17 on evaluation today patient appears to be doing very well in regard to his left lateral lower extremity ulcer. He is been tolerating the dressing changes without complication which is good news. Fortunately there does not appear to be any evidence of infection which is also great news. Overall is doing excellent they are starting to taper down on the prednisone is down to 40 mg at this point it also started topical clobetasol for him. 11/17/17 on evaluation today patient appears to be doing well in regard to his left lateral lower surety ulcer. He's been tolerating the dressing changes without complication. He does note that he is having no pain, no excessive drainage or discharge, and overall he feels like things are going about how he would expect and hope they would. Overall he seems to have no evidence of infection at this time in my opinion which is  good news. 12/04/17-He is seen in follow-up evaluation for right lateral lower extremity ulcer. He has been applying topical steroid cream. Today's measurement show slight increase in size. Over the next 2 weeks we will transition to every other day Santyl and steroid cream. He has been encouraged to monitor for changes and notify clinic with any concerns 12/15/17 on evaluation today patient's left lateral motion the ulcer and fortunately is doing worse again at this point. This just since last week to this week has close to doubled in size according to the patient. I did not seeing last week's I do not have a visual to compare this to in our system was also down so we do not have all the charts and at this point. Nonetheless it does have me somewhat concerned in regard to the fact that again he was worried enough about it he has contact the dermatology that placed them back on the full strength, 50 mg a day of the prednisone that he was taken previous. He continues to alternate using clobetasol along with Santyl at this point. He is obviously somewhat frustrated. 12/22/17 on evaluation today patient appears to be doing a little worse compared to last evaluation. Unfortunately the wound is a little deeper and slightly larger than the last week's evaluation. With that being said he has made some progress in regard to the irritation surrounding at this time unfortunately despite that progress that's been made he still has a significant issue going on here. 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 For that reason I discussed with him today the possibility of perform a wound culture to ensure there's nothing overtly infected. 01/06/18 on evaluation today patient's wound is larger and deeper than previously evaluated. With that being said it did appear that  his wound was infected after my last evaluation with him. Subsequently I did end up prescribing a prescription for Bactrim DS which she has been taking and having no complication with. Fortunately there does not appear to be any evidence of Schneeberger, [name] ([id number]) infection at this point in time as far as anything spreading, no want to  touch, and overall I feel like things are showing signs of improvement. 01/13/18 on evaluation today patient appears to be even a little larger and deeper than last time. There still muscle exposed in the base of the wound. Nonetheless he does appear to be less erythematous I do believe inflammation is calming down also believe the infection looks like it's probably resolved at this time based on what I'm seeing. No fevers, chills, nausea, or vomiting noted at this time. 01/30/18 on evaluation today patient actually appears to visually look better for the most part. Unfortunately those visually this looks better he does seem to potentially have what may be an abscess in the muscle that has been noted in the central portion of the wound. This is the first time that I have noted what appears to be fluctuance in the central portion of the muscle. With that being said I'm somewhat more concerned about the fact that this might indicate an abscess formation at this location. I do believe that an ultrasound would be appropriate. This is likely something we need to try to do as soon as possible. He has been switch to mupirocin ointment and he is no longer using the steroid ointment as prescribed by dermatology he sees them again next week he's been decreased from 60 to 40 mg of prednisone. 03/09/18 on evaluation today patient actually appears to be doing a little better compared to last time I saw him. There's not as much erythema surrounding the wound itself. He I did review his most recent infectious disease note which was dated 02/24/18. He saw Dr. Michel Bickers in  [place]. 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 Patient is now off of antibiotics and they are continually observing this. There seems to be no change in the past two weeks in my pinion based on what the patient says and what I see today compared to what Dr. Megan Salon likely saw two weeks ago. No fevers, chills, nausea, or vomiting noted at this time. 03/23/18 on evaluation today patient's wound actually appears to be showing signs of improvement which is good news. He is currently still on the Dapsone. He is also working on tapering the prednisone to get off of this and Dr. Lottie Rater is working with him in this regard. Nonetheless overall I feel like the wound is doing well it does appear based on the infectious disease note that I reviewed from Dr. 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 I did also review the results of his ultrasound of left lower extremity which revealed there was a dentist tissue in the base of the wound without an abscess noted. 04/06/18 on evaluation today the patient's left lateral lower extremity ulcer actually appears to be doing fairly well which is excellent news. There does not appear to be any evidence of infection at this time which is also great news. Overall he still does have a significantly large ulceration although little by little he seems to be making progress. He is down to 10 mg a day of the prednisone. 04/20/18 on evaluation today patient actually appears to be doing excellent at this time in regard to his left lower extremity ulcer. He's making signs of good progress unfortunately this is taking much longer than we would really like  to see but nonetheless he is making progress. Fortunately there does not appear to be any evidence of infection at this  time. No fevers, chills, nausea, or vomiting noted at this time. The patient has not been using the Santyl due to the cost he hadn't got in this field yet. He's mainly been using the antibiotic ointment topically. Subsequently he also tells me that he really has not been scrubbing in the shower I think this would be helpful again as I told him it doesn't have to be anything too aggressive to even make it believe just enough to keep it free of some of the loose slough and biofilm on the wound surface. 05/11/18 on evaluation today patient's wound appears to be making slow but sure progress in regard to the left lateral lower extremity ulcer. He is been tolerating the dressing changes without complication. Fortunately there does not appear to be any evidence of infection at this time. He is still just using triple antibiotic ointment along with clobetasol occasionally over the area. He never got the Santyl and really does not seem to intend to in my pinion. 06/01/18 on evaluation today patient appears to be doing a little better in regard to his left lateral lower extremity ulcer. He states that overall he does not feel like he is doing as well with the Dapsone as he did with the prednisone. Nonetheless he sees his dermatologist later today and is gonna talk to them about the possibility of going back on the prednisone. Overall again I believe that the wound would be better if you would utilize Santyl but he really does not seem to be interested in going back to the [place] at this point. He has been using triple antibiotic ointment. 06/15/18 on evaluation today patient's wound actually appears to be doing about the same at this point. Fortunately there is no signs of infection at this time. He has made slight improvements although he continues to not really want to clean the wound bed at this point. He states that he just doesn't mess with it he doesn't want to cause any problems with everything else  he has going on. He has been on medication, antibiotics as prescribed by his dermatologist, for a staff infection of his lower extremities which is really drying out now and looking much better he tells me. Fortunately there is no sign of overall infection. [name], [name] ([id number]) 06/29/18 on evaluation today patient appears to be doing well in regard to his left lateral lower surety ulcer all things considering. Fortunately his staff infection seems to be greatly improved compared to previous. He has no signs of infection and this is drying up quite nicely. He is still the doxycycline for this is no longer on cental, Dapsone, or any of the other medications. His dermatologist has recommended possibility of an infusion but right now he does not want to proceed with that. 07/13/18 on evaluation today patient appears to be doing about the same in regard to his left lateral lower surety ulcer. Fortunately there's no signs of infection at this time which is great news. Unfortunately he still builds up a significant amount of Slough/biofilm of the surface of the wound he still is not really cleaning this as he should be appropriately. Again I'm able to easily with saline and gauze remove the majority of this on the surface which if you would do this at home would likely be a dramatic improvement for him as far as getting the area  to improve. Nonetheless overall I still feel like he is making progress is just very slow. I think Santyl will be of benefit for him as well. Still he has not gotten this as of this point. 07/27/18 on evaluation today patient actually appears to be doing little worse in regards of the erythema around the periwound region of the wound he also tells me that he's been having more drainage currently compared to what he was experiencing last time I saw him. He states not quite as bad as what he had because this was infected previously but nonetheless is still appears to be doing  poorly. Fortunately there is no evidence of systemic infection at this point. The patient tells me that he is not going to be able to afford the Santyl. He is still waiting to hear about the infusion therapy with his dermatologist. Apparently she wants an updated colonoscopy first. 08/10/18 on evaluation today patient appears to be doing better in regard to his left lateral lower extremity ulcer. Fortunately he is showing signs of improvement in this regard he's actually been approved for Remicade infusion's as well although this has not been scheduled as of yet. Fortunately there's no signs of active infection at this time in regard to the wound although he is having some issues with infection of the right lower extremity is been seen as dermatologist for this. Fortunately they are definitely still working with him trying to keep things under control. 09/07/18 on evaluation today patient is actually doing rather well in regard to his left lateral lower extremity ulcer. He notes these actually having some hair grow back on his extremity which is something he has not seen in years. He also tells me that the pain is really not giving them any trouble at this time which is also good news overall she is very pleased with the progress he's using a combination of the mupirocin along with the probate is all mixed. 09/21/18 on evaluation today patient actually appears to be doing fairly well all things considered in regard to his looks from the ulcer. He's been tolerating the dressing changes without complication. Fortunately there's no signs of active infection at this time which is good news he is still on all antibiotics or prevention of the staff infection. He has been on prednisone for time although he states it is gonna contact his dermatologist and see if she put them on a short course due to some irritation that he has going on currently. Fortunately there's no evidence of any overall worsening this is  going very slow I think cental would be something that would be helpful for him although he states that $50 for tube is quite expensive. He therefore is not willing to get that at this point. 10/06/18 on evaluation today patient actually appears to be doing decently well in regard to his left lateral leg ulcer. He's been tolerating the dressing changes without complication. Fortunately there's no signs of active infection at this time. Overall I'm actually rather pleased with the progress he's making although it's slow he doesn't show any signs of infection and he does seem to be making some improvement. I do believe that he may need a switch up and dressings to try to help this to heal more appropriately and quickly. 10/19/18 on evaluation today patient actually appears to be doing better in regard to his left lateral lower extremity ulcer. This is shown signs of having much less Slough buildup at this point due to  the fact he has been using the Santyl. Obviously this is very good news. The overall size of the wound is not dramatically smaller but again the appearance is. 11/02/18 on evaluation today patient actually appears to be doing quite well in regard to his lower [place] ulcer. A lot of the skin around the ulcer is actually somewhat irritating at this point this seems to be more due to the dressing causing irritation from the adhesive that anything else. Fortunately there is no signs of active infection at this time. 11/24/18 on evaluation today patient appears to be doing a little worse in regard to his overall appearance of his lower extremity ulcer. There's more erythema and warmth around the wound unfortunately. He is currently on doxycycline which he has been on for some time. With that being said I'm not sure that seems to be helping with what appears to possibly be an acute cellulitis with regard to his left lower extremity ulcer. No fevers, chills, nausea, or vomiting noted at this  time. 12/08/18 on evaluation today patient's wounds actually appears to be doing significantly better compared to his last evaluation. He has been using Santyl along with alternating tripling about appointment as well as the steroid cream seems to be doing [name], [name] ([id number]) quite well and the wound is showing signs of improvement which is excellent news. Fortunately there's no evidence of infection and in fact his culture came back negative with only normal skin flora noted. 12/21/2018 upon evaluation today patient actually appears to be doing excellent with regard to his ulcer. This is actually the best that I have seen it since have been helping to take care of him. It is both smaller as well as less slough noted on the surface of the wound and seems to be showing signs of good improvement with new skin growing from the edges. He has been using just the triamcinolone he does wonder if he can get a refill of that ointment today. 01/04/2019 upon evaluation today patient actually appears to be doing well with regard to his left lateral lower extremity ulcer. With that being said it does not appear to be that he is doing quite as well as last time as far as progression is concerned. There does not appear to be any signs of infection or significant irritation which is good news. With that being said I do believe that he may benefit from switching to a collagen based dressing based on how clean The wound appears. 01/18/2019 on evaluation today patient actually appears to be doing well with regard to his wound on the left lower extremity. He is not made a lot of progress compared to where we were previous but nonetheless does seem to be doing okay at this time which is good news. There is no signs of active infection which is also good news. My only concern currently is I do wish we can get him into utilizing the collagen dressing his insurance would not pay for the supplies that we ordered although  it appears that he may be able to order this through his supply company that he typically utilizes. This is [employer]. Nonetheless he did try to order it during the office visit today and it appears this did go through. We will see if he can get that it is a different brand but nonetheless he has collagen and I do think will be beneficial. 02/01/2019 on evaluation today patient actually appears to be doing a little worse today in regard  to the overall size of his wounds. Fortunately there is no signs of active infection at this time. That is visually. Nonetheless when this is happened before it was due to infection. For that reason were somewhat concerned about that this time as well. 02/08/2019 on evaluation today patient unfortunately appears to be doing slightly worse with regard to his wound upon evaluation today. Is measuring a little deeper and a little larger unfortunately. I am not really sure exactly what is causing this to enlarge he actually did see his dermatologist she is going to see about initiating Humira for him. Subsequently she also did do steroid injections into the wound itself in the periphery. Nonetheless still nonetheless he seems to be getting a little bit larger he is gone back to just using the steroid cream topically which I think is appropriate. I would say hold off on the collagen for the time being is definitely a good thing to do. Based on the culture results which we finally did get the final result back regarding it shows staph as the bacteria noted again that can be a normal skin bacteria based on the fact however he is having increased drainage and worsening of the wound measurement wise I would go ahead and place him on an antibiotic today I do believe for this. 02/15/2019 on evaluation today patient actually appears to be doing somewhat better in regard to his ulcer. There is no signs of worsening at this time I did review his culture results which showed evidence  of Staphylococcus aureus but not MRSA. Again this could just be more related to the normal skin bacteria although he states the drainage has slowed down quite a bit he may have had a mild infection not just colonization. And was much smaller and then since around10/04/2019 on evaluation today patient appears to be doing unfortunately worse as far as the size of the wound. I really feel like that this is steadily getting larger again it had been doing excellent right at the beginning of September we have seen a steady increase in the area of the wound it is almost 2-1/2 times the size it was on September 1. Obviously this is a bad trend this is not wanting to see. For that reason we went back to using just the topical triamcinolone cream which does seem to help with inflammation. I checked him for bacteria by way of culture and nothing showed positive there. I am considering giving him a short course of a tapering steroid Dosepak today to see if that is can be beneficial for him. The patient is in agreement with giving that a try. 03/08/2019 on evaluation today patient appears to be doing very well in comparison to last evaluation with regard to his lower extremity ulcer. This is showing signs of less inflammation and actually measuring slightly smaller compared to last time every other week over the past month and a half he has been measuring larger larger larger. Nonetheless I do believe that the issue has been inflammation the prednisone does seem to have been beneficial for him which is good news. No fevers, chills, nausea, vomiting, or diarrhea. 03/22/2019 on evaluation today patient appears to be doing about the same with regard to his leg ulcer. He has been tolerating the dressing changes without complication. With that being said the wound seems to be mostly arrested at its current size but really is not making any progress except for when we prescribed the prednisone. He did show some  signs  of dropping as far as the overall size of the wound during that interval week. Nonetheless this is something he is not on long-term at this point and unfortunately I think he is getting need either this or else the Humira which his dermatologist has discussed try to get approval for. With that being said he will be seeing his dermatologist on the 11th of this month that is November. [name], [name] ([id number]) 04/19/2019 on evaluation today patient appears to be doing really about the same the wound is measuring slightly larger compared to last time I saw him. He has not been into the office since November 2 due to the fact that he unfortunately had Covid as that his entire family. He tells me that it was rough but they did pull-through and he seems to be doing much better. Fortunately there is no signs of active infection at this time. No fevers, chills, nausea, vomiting, or diarrhea. 05/10/2019 on evaluation today patient unfortunately appears to be doing significantly worse as compared to last time I saw him. He does tell me that he has had his first dose of Humira and actually is scheduled to get the next one in the upcoming week. With that being said he tells me also that in the past several days he has been having a lot of issues with green drainage she showed me a picture this is more blue-green in color. He is also been having issues with increased sloughy buildup and the wound does appear to be larger today. Obviously this is not the direction that we want everything to take based on the starting of his Humira. Nonetheless I think this is definitely a result of likely infection and to be honest I think this is probably Pseudomonas causing the infection based on what I am seeing. 05/24/2019 on evaluation today patient unfortunately appears to be doing significantly worse compared to his prior evaluation with me 2 weeks ago. I did review his culture results which showed that he does have Staph  aureus as well as Pseudomonas noted on the culture. Nonetheless the Levaquin that I prescribed for him does not appear to have been appropriate and in fact he tells me he is no longer experiencing the green drainage and discharge that he had at the last visit. Fortunately there is no signs of active infection at this time which is good news although the wound has significantly worsened it in fact is much deeper than it was previous. We have been utilizing up to this point triamcinolone ointment as the prescription topical of choice but at this time I really feel like that the wound is getting need to be packed in order to appropriately manage this due to the deeper nature of the wound. Therefore something along the lines of an alginate dressing may be more appropriate. Electronic Signature(s) Signed: 05/25/2019 8:07:19 AM By: Worthy Keeler PA-C Entered By: Worthy Keeler on 05/25/2019 08:07:18 [name], [name] ([id number]) -------------------------------------------------------------------------------- Physical Exam Details Patient Name: [name] Date of Service: 05/24/2019 8:00 AM Medical Record Number: [id number] Patient Account Number: [id number] Date of Birth/Sex: 1980/11/19 (39 y.o. M) Treating RN: Harold Barban Primary Care Provider: [name] Other Clinician: Referring Provider: [name] Treating Provider/Extender: Melburn Hake, Anisah Kuck Weeks in Treatment: 5 Constitutional Well-nourished and well-hydrated in no acute distress. Respiratory normal breathing without difficulty. Psychiatric this patient is able to make decisions and demonstrates good insight into disease process. Alert and Oriented x 3. pleasant and cooperative. Notes Patient's wound  bed upon inspection today does appear to be showing signs of worsening there is muscle exposure there is also undermining and again the wound is deeper compared to what it was previous. We are going to need to pack  something in to fill the space at this point and I think he be better served by an alginate dressing as opposed to the ointment at this time. No sharp debridement was performed today although honestly there did not appear to be a need for this I am just more concerned in general with the fact that the patient seems to be each week getting worse not showing any signs of improvement. Electronic Signature(s) Signed: 05/25/2019 8:08:47 AM By: Worthy Keeler PA-C Entered By: Worthy Keeler on 05/25/2019 08:08:47 [name] ([id number]) -------------------------------------------------------------------------------- Physician Orders Details Patient Name: [name] Date of Service: 05/24/2019 8:00 AM Medical Record Number: [id number] Patient Account Number: [id number] Date of Birth/Sex: [birth year] (39 y.o. 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 Anesthetic (add to Medication List) Wound #1 Left,Lateral Lower Leg o Topical Lidocaine 4% cream applied to wound bed prior to debridement (In Clinic Only). Primary Wound Dressing Wound #1 Left,Lateral Lower Leg o Alginate - Plain Alginate Secondary Dressing Wound #1 Left,Lateral Lower Leg o ABD pad o Dry Gauze Dressing Change Frequency Wound #1 Left,Lateral Lower Leg o Change dressing every other day. Follow-up Appointments Wound #1 Left,Lateral Lower Leg o Return Appointment in 1 week. Edema Control Wound #1 Left,Lateral Lower Leg o Elevate legs to the level of the  heart and pump ankles as often as possible Patient Medications Allergies: milk, Biaxin, seasonal Notifications Medication Indication Start End Cipro 05/24/2019 Pflaum, Donn ([id number]) Notifications Medication Indication Start End DOSE 1 - oral 500 mg tablet - 1 tablet oral taken 2 times a day for 14 days Electronic Signature(s) Signed: 05/24/2019 4:45:12 PM By: Harold Barban Signed: 05/26/2019 6:44:22 PM By: Worthy Keeler PA-C Previous Signature: 05/24/2019 8:31:24 AM Version By: Worthy Keeler PA-C Entered By: Harold Barban on 05/24/2019 08:43:38 [name], [name] [place] ([id number]) -------------------------------------------------------------------------------- Problem List Details Patient Name: [name] Date of Service: 05/24/2019 8:00 AM Medical Record Number: [id number] Patient Account Number: [id number] Date of Birth/Sex: Mar 21, 1981 (39 y.o. M) Treating RN: Harold Barban Primary Care Provider: [name] Other Clinician: Referring Provider: [name] Treating Provider/Extender: Melburn Hake, Erickson Yamashiro Weeks in Treatment: 128 Active Problems ICD-10 Evaluated Encounter Code Description Active Date Today Diagnosis L97.222 Non-pressure chronic ulcer of left calf with fat layer exposed 12/04/2016 No Yes L88 Pyoderma gangrenosum 03/26/2017 No Yes I87.2 Venous insufficiency (chronic) (peripheral) 12/04/2016 No Yes L03.116 Cellulitis of left lower limb 05/24/2019 No Yes Inactive Problems ICD-10 Code Description Active Date Inactive Date L97.213 Non-pressure chronic ulcer of right calf with necrosis of muscle 04/02/2017 04/02/2017 Resolved Problems Electronic Signature(s) Signed: 05/24/2019 8:47:43 AM By: Worthy Keeler PA-C Entered By: Worthy Keeler on 05/24/2019 08:47:43 [name], [name] ([id number]) -------------------------------------------------------------------------------- Progress Note Details Patient Name: [name] Date of Service: 05/24/2019 8:00 AM Medical Record  Number: [id number] Patient Account Number: [id number] Date of Birth/Sex: May 27, 1980 (39 y.o. M) Treating RN: Harold Barban Primary Care Provider: [name] Other Clinician: Referring  Provider: [name] Treating Provider/Extender: Melburn Hake, Yusuke Beza Weeks in Treatment: 128 Subjective Chief Complaint Information obtained from Patient He is here in follow up evaluation for LLE pyoderma ulcer History of Present Illness (HPI) 12/04/16; 39 year old man who comes into the clinic today for review of a wound on the posterior left calf. He tells me that is been there for about a year. He is not a diabetic he does smoke half a pack per day. He was seen in the ER on 11/20/16 felt to have cellulitis around the wound and was given clindamycin. An x-[name] did not show osteomyelitis. The patient initially tells me that he has a milk allergy that sets off a pruritic itching rash on his lower legs which she scratches incessantly and he thinks that's what may have set up the wound. He has been using various topical antibiotics and ointments without any effect. He works in a [employer]ing [employer] and is on his feet all day. He does not have a prior history of wounds however he does have the rash on both lower legs the right arm and the ventral aspect of his left arm. These are excoriations and clearly have had scratching however there are of macular looking areas on both legs including a substantial larger area on the right leg. This does not have an underlying open area. There is no blistering. The patient tells me that 2 years ago in [place] in response to the rash on his legs he saw a dermatologist who told him he had a condition which may be pyoderma gangrenosum although I may be putting words into his mouth. He seemed to recognize this. 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 He is not in any treatment for this. He's had no recent travel 12/11/16; the patient arrives  today with his wound and roughly the same condition we've been using silver alginate this is a deep punched out wound with some surrounding erythema but no tenderness. Biopsy I did did not show confirmed pyoderma gangrenosum suggested nonspecific inflammation and vasculitis but does not provide an actual description of what was seen by the pathologist. I'm really not able to understand this We have also received information from the patient's dermatologist in [place] notes from April 2016. This was a doctor Agarwal- antal. 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 He was prescribed topical steroid high potency under occlusion which helped but at this point the patient did not have a deep punched out wound. 12/18/16; the patient's wound is larger in terms of surface area however this surface looks better and there is less depth. The surrounding erythema also is better. The patient states that the wrap we put on came off 2 days ago when he has been using his compression stockings. He we are in the process of getting a dermatology consult. 12/26/16 on evaluation today patient's left lower extremity wound shows evidence of infection with surrounding erythema noted. He has been tolerating the dressing changes but states that he has noted more discomfort. There is a larger area of erythema surrounding the wound. No fevers, chills, nausea, or vomiting noted at this time. With that being said the wound still does have slough covering the surface. He is not allergic to any medication that he is aware of at this point. In regard to his right lower extremity he had several regions that are erythematous and pruritic he wonders if there's anything we can do to help that. 01/02/17  I reviewed patient's wound culture which was obtained his visit last week. He was placed on doxycycline at that point. Unfortunately that does not appear to be an antibiotic that would likely help with the situation however the  pseudomonas noted on culture is sensitive to Cipro. Also unfortunately patient's wound seems to have a large compared to last week's evaluation. Not severely so but there are definitely increased measurements in general. He is continuing to have discomfort as well he writes this to be a seven out of 10. In fact he would prefer me not to perform any debridement today due to the fact that he is having discomfort and considering he has an active infection on the little reluctant to do so anyway. No fevers, [name], [name] ([id number]) chills, nausea, or vomiting noted at this time. 01/08/17; patient seems dermatology on September 5. I suspect dermatology will want the slides from the biopsy I did sent to their pathologist. I'm not sure if there is a way we can expedite that. In any case the culture I did before I left on vacation 3 weeks ago showed Pseudomonas he was given 10 days of Cipro and per her description of her intake nurses is actually somewhat better this week although the wound is quite a bit bigger than I remember the last time I saw this. He still has 3 more days of Cipro 01/21/17; dermatology appointment tomorrow. He has completed the ciprofloxacin for Pseudomonas. Surface of the wound looks better however he is had some deterioration in the lesions on his right leg. Meantime the left lateral leg wound we will continue with sample 01/29/17; patient had his dermatology appointment but I can't yet see that note. He is completed his antibiotics. The wound is more superficial but considerably larger in circumferential area than when he came in. This is in his left lateral calf. He also has swollen erythematous areas with superficial wounds on the right leg and small papular areas on both arms. There apparently areas in her his upper thighs and buttocks I did not look at those. Dermatology biopsied the right leg. Hopefully will have their input next week. 02/05/17; patient went back to see his  dermatologist who told him that he had a "scratching problem" as well as staph. He is now on a 30 day course of doxycycline and I believe she gave him triamcinolone cream to the right leg areas to help with the itching [not exactly sure but probably triamcinolone]. She apparently looked at the left lateral leg wound although this was not rebiopsied and I think felt to be ultimately part of the same pathogenesis. He is using sample border foam and changing nevus himself. He now has a new open area on the right posterior leg which was his biopsy site I don't have any of the dermatology notes 02/12/17; we put the patient in compression last week with SANTYL to the wound on the left leg and the biopsy. Edema is much better and the depth of the wound is now at level of skin. Area is still the same Biopsy site on the right lateral leg we've also been using santyl with a border foam dressing and he is changing this himself. 02/19/17; Using silver alginate started last week to both the substantial left leg wound and the biopsy site on the right wound. He is tolerating compression well. Has a an appointment with his primary M.D. tomorrow wondering about diuretics although I'm wondering if the edema problem is actually lymphedema 02/26/17; the  patient has been to see his primary doctor Dr. Jerrel Ivory at [place] our primary care. She started him on Lasix 20 mg and this seems to have helped with the edema. However we are not making substantial change with the left lateral calf wound and inflammation. The biopsy site on the right leg also looks stable but not really all that different. 03/12/17; the patient has been to see vein and vascular Dr. Lucky Cowboy. He has had venous reflux studies I have not reviewed these. I did get a call from his dermatology office. They felt that he might have pathergy based on their biopsy on his right leg which led them to look at the slides of the biopsy I did on the left leg and they  wonder whether this represents pyoderma gangrenosum which was the original supposition in a man with ulcerative colitis albeit inactive for many years. They therefore recommended clobetasol and tetracycline i.e. 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 03/26/17; apparently the patient just had reflux studies not an appointment with Dr. dew. She arrives in clinic today having applied clobetasol for 2-3 weeks. He notes over the last 2-3 days excessive drainage having to change the dressing 3-4 times a day and also expanding erythema. 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 Lillia Carmel at [facility] dermatology locally who kindly saw him at my request. 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 My concern was the degree of erythema medially extending from his left leg wound which was either inflammation from pyoderma or cellulitis. I put him on Augmentin however culture of the wound showed Pseudomonas which is quinolone sensitive. I really don't believe he has cellulitis however in view of everything I will continue and give him a course of Cipro. He is also on doxycycline as an immune modulator for the pyoderma. In addition to his original wound on the left lateral leg with surrounding erythema he has a wound on the right posterior calf which was an original biopsy site done by dermatology. 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 Saw Dr. Lillia Carmel yesterday. He has been using topical antibiotics to both wound areas his original wound on the left and the biopsies/pathergy area on the right. There is definitely some  improvement in the inflammation around the wound on the right although the patient states he has increasing sensitivity of the wounds. He is on prednisone 60 and doxycycline 1 as prescribed by Dr. Lillia Carmel. He is covering the topical antibiotic with gauze and putting this in his own compression stocks and changing this daily. He states that Dr. Lottie Rater did a culture of the left leg wound yesterday 05/07/17; pyoderma gangrenosum. The patient saw Dr. Lillia Carmel yesterday and has a follow-up with her in one month. He is still using topical antibiotics to both wounds although he can't recall exactly what type. He is still on prednisone 60 mg. Dr. Lillia Carmel stated that the doxycycline could stop if we were in agreement. 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 The right medial leg was induced by [name] ([id number]) biopsy/pathergy. The area on the  right is essentially healed. 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 The right medial leg has closed and remains closed. He is still on prednisone 60. He tells me he missed his last dermatology appointment with Dr. Lillia Carmel but will make another appointment. He reports that her blood sugar at a recent screen in [place] was high 200's. He was 180 today. 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 I think he is going to require still much longer prednisone perhaps another 3 months before attempting to taper. In the meantime his wound is a lot better. Smaller. He is cleaning this off daily and applying topical antibiotics. When he was last in the clinic I thought about changing to [facility] and actually put in a couple of calls to dermatology although probably not during their business hours. 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 He continues on oral prednisone. He has been using triple antibiotic ointment. There is surface debris and we will transition to [facility] and have him return in 2 weeks. He has lost 30 pounds since his last appointment with lifestyle modification. He may benefit from topical steroid cream for treatment this can be considered at a later date. 08/22/17 on evaluation today patient appears to actually be doing rather well in regard to his left lateral lower extremity ulcer. He has actually been managed by Dr. Dellia Nims most recently. Patient is currently on oral steroids at this time. This seems to have been of benefit for him. Nonetheless his last visit was actually with Leah on 08/06/17. Currently he is not utilizing any topical steroid creams although this could be of benefit as well. No fevers, chills, nausea, or vomiting noted at this time. 09/05/17 on evaluation today patient appears to be doing better in regard to his left lateral lower extremity ulcer. He has been tolerating the dressing changes without complication. He is using Santyl with good effect. Overall I'm very pleased with how things are standing at this point. Patient likewise is happy that this is doing better. 09/19/17 on evaluation today patient actually appears to be doing rather well in regard to his left lateral lower extremity ulcer. 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 He's not having any significant pain. 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 He has been tolerating the Santyl without complication and in general I feel like he's making good progress. 10/17/17 on evaluation today patient appears to be doing very well in  regard to his left lateral lower surety ulcer. He has been tolerating the dressing changes without complication. There does not appear to be any evidence of infection he's alternating the Santyl and the triple antibiotic ointment every other day this seems to be doing well for him. 11/03/17 on evaluation today patient appears to be doing very well in regard to his left lateral lower extremity ulcer. He is been tolerating the dressing changes without complication which is good news. Fortunately there does not appear to be any evidence of infection which is also great news. Overall is doing excellent they are starting to taper down on the prednisone  is down to 40 mg at this point it also started topical clobetasol for him. 11/17/17 on evaluation today patient appears to be doing well in regard to his left lateral lower surety ulcer. He's been tolerating the dressing changes without complication. He does note that he is having no pain, no excessive drainage or discharge, and overall he feels like things are going about how he would expect and hope they would. Overall he seems to have no evidence of infection at this time in my opinion which is good news. 12/04/17-He is seen in follow-up evaluation for right lateral lower extremity ulcer. He has been applying topical steroid cream. Today's measurement show slight increase in size. Over the next 2 weeks we will transition to every other day Santyl and steroid cream. He has been encouraged to monitor for changes and notify clinic with any concerns 12/15/17 on evaluation today patient's left lateral motion the ulcer and fortunately is doing worse again at this point. This just since last week to this week has close to doubled in size according to the patient. I did not seeing last week's I do not have a visual to compare this to in our system was also down so we do not have all the charts and at this point. Nonetheless it does have me somewhat concerned in  regard to the fact that again he was worried enough about it he has contact the dermatology that placed them back on the full strength, 50 mg a day of the prednisone that he was taken previous. He continues to alternate using clobetasol along with Santyl at this point. He is obviously somewhat frustrated. 12/22/17 on evaluation today patient appears to be doing a little worse compared to last evaluation. Unfortunately the wound is a little deeper and slightly larger than the last week's evaluation. With that being said he has made some progress in regard to the irritation surrounding at this time unfortunately despite that progress that's been made he still has a significant issue going on here. I'm not certain that he is having really any true infection at this time although with the Pyoderma gangrenosum it can Thammavong, Demorris ([id number]) sometimes be difficult to differentiate infection versus just inflammation. For that reason I discussed with him today the possibility of perform a wound culture to ensure there's nothing overtly infected. 01/06/18 on evaluation today patient's wound is larger and deeper than previously evaluated. With that being said it did appear that his wound was infected after my last evaluation with him. Subsequently I did end up prescribing a prescription for Bactrim DS which she has been taking and having no complication with. Fortunately there does not appear to be any evidence of infection at this point in time as far as anything spreading, no want to touch, and overall I feel like things are showing signs of improvement. 01/13/18 on evaluation today patient appears to be even a little larger and deeper than last time. There still muscle exposed in the base of the wound. Nonetheless he does appear to be less erythematous I do believe inflammation is calming down also believe the infection looks like it's probably resolved at this time based on what I'm seeing. No fevers,  chills, nausea, or vomiting noted at this time. 01/30/18 on evaluation today patient actually appears to visually look better for the most part. Unfortunately those visually this looks better he does seem to potentially have what may be an abscess in the muscle that has been noted in the  central portion of the wound. This is the first time that I have noted what appears to be fluctuance in the central portion of the muscle. With that being said I'm somewhat more concerned about the fact that this might indicate an abscess formation at this location. I do believe that an ultrasound would be appropriate. This is likely something we need to try to do as soon as possible. He has been switch to mupirocin ointment and he is no longer using the steroid ointment as prescribed by dermatology he sees them again next week he's been decreased from 60 to 40 mg of prednisone. 03/09/18 on evaluation today patient actually appears to be doing a little better compared to last time I saw him. There's not as much erythema surrounding the wound itself. He I did review his most recent infectious disease note which was dated 02/24/18. He saw Dr. Michel Bickers in [place]. 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 Patient is now off of antibiotics and they are continually observing this. There seems to be no change in the past two weeks in my pinion based on what the patient says and what I see today compared to what Dr. Megan Salon likely saw two weeks ago. No fevers, chills, nausea, or vomiting noted at this time. 03/23/18 on evaluation today patient's wound actually appears to be showing signs of improvement which is good news. He is currently still on the Dapsone. He is also working on tapering the prednisone to get off of this and Dr. Lottie Rater is working with him in this regard. Nonetheless overall I feel like the wound is doing  well it does appear based on the infectious disease note that I reviewed from Dr. 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 I did also review the results of his ultrasound of left lower extremity which revealed there was a dentist tissue in the base of the wound without an abscess noted. 04/06/18 on evaluation today the patient's left lateral lower extremity ulcer actually appears to be doing fairly well which is excellent news. There does not appear to be any evidence of infection at this time which is also great news. Overall he still does have a significantly large ulceration although little by little he seems to be making progress. He is down to 10 mg a day of the prednisone. 04/20/18 on evaluation today patient actually appears to be doing excellent at this time in regard to his left lower extremity ulcer. He's making signs of good progress unfortunately this is taking much longer than we would really like to see but nonetheless he is making progress. Fortunately there does not appear to be any evidence of infection at this time. No fevers, chills, nausea, or vomiting noted at this time. The patient has not been using the Santyl due to the cost he hadn't got in this field yet. He's mainly been using the antibiotic ointment topically. Subsequently he also tells me that he really has not been scrubbing in the shower I think this would be helpful again as I told him it doesn't have to be anything too aggressive to even make it believe just enough to keep it free of some of the loose slough and biofilm on the wound surface. 05/11/18 on evaluation today patient's wound  appears to be making slow but sure progress in regard to the left lateral lower extremity ulcer. He is been tolerating the dressing changes without complication. Fortunately there does not appear to be any evidence of  infection at this time. He is still just using triple antibiotic ointment along with clobetasol occasionally over the area. He never got the Santyl and really does not seem to intend to in my pinion. 06/01/18 on evaluation today patient appears to be doing a little better in regard to his left lateral lower extremity ulcer. He states that overall he does not feel like he is doing as well with the Dapsone as he did with the prednisone. Nonetheless he sees his dermatologist later today and is gonna talk to them about the possibility of going back on the prednisone. Overall again I believe that the wound would be better if you would utilize Santyl but he really does not seem to be interested in going back to the [place] at this point. He has been using triple antibiotic ointment. ASTIN, SAYRE ([id number]) 06/15/18 on evaluation today patient's wound actually appears to be doing about the same at this point. Fortunately there is no signs of infection at this time. He has made slight improvements although he continues to not really want to clean the wound bed at this point. He states that he just doesn't mess with it he doesn't want to cause any problems with everything else he has going on. He has been on medication, antibiotics as prescribed by his dermatologist, for a staff infection of his lower extremities which is really drying out now and looking much better he tells me. Fortunately there is no sign of overall infection. 06/29/18 on evaluation today patient appears to be doing well in regard to his left lateral lower surety ulcer all things considering. Fortunately his staff infection seems to be greatly improved compared to previous. He has no signs of infection and this is drying up quite nicely. He is still the doxycycline for this is no longer on cental, Dapsone, or any of the other medications. His dermatologist has recommended possibility of an infusion but right now he does not want to proceed  with that. 07/13/18 on evaluation today patient appears to be doing about the same in regard to his left lateral lower surety ulcer. Fortunately there's no signs of infection at this time which is great news. Unfortunately he still builds up a significant amount of Slough/biofilm of the surface of the wound he still is not really cleaning this as he should be appropriately. Again I'm able to easily with saline and gauze remove the majority of this on the surface which if you would do this at home would likely be a dramatic improvement for him as far as getting the area to improve. Nonetheless overall I still feel like he is making progress is just very slow. I think Santyl will be of benefit for him as well. Still he has not gotten this as of this point. 07/27/18 on evaluation today patient actually appears to be doing little worse in regards of the erythema around the periwound region of the wound he also tells me that he's been having more drainage currently compared to what he was experiencing last time I saw him. He states not quite as bad as what he had because this was infected previously but nonetheless is still appears to be doing poorly. Fortunately there is no evidence of systemic infection at this point. The patient tells me  that he is not going to be able to afford the Santyl. He is still waiting to hear about the infusion therapy with his dermatologist. Apparently she wants an updated colonoscopy first. 08/10/18 on evaluation today patient appears to be doing better in regard to his left lateral lower extremity ulcer. Fortunately he is showing signs of improvement in this regard he's actually been approved for Remicade infusion's as well although this has not been scheduled as of yet. Fortunately there's no signs of active infection at this time in regard to the wound although he is having some issues with infection of the right lower extremity is been seen as dermatologist for this.  Fortunately they are definitely still working with him trying to keep things under control. 09/07/18 on evaluation today patient is actually doing rather well in regard to his left lateral lower extremity ulcer. He notes these actually having some hair grow back on his extremity which is something he has not seen in years. He also tells me that the pain is really not giving them any trouble at this time which is also good news overall she is very pleased with the progress he's using a combination of the mupirocin along with the probate is all mixed. 09/21/18 on evaluation today patient actually appears to be doing fairly well all things considered in regard to his looks from the ulcer. He's been tolerating the dressing changes without complication. Fortunately there's no signs of active infection at this time which is good news he is still on all antibiotics or prevention of the staff infection. He has been on prednisone for time although he states it is gonna contact his dermatologist and see if she put them on a short course due to some irritation that he has going on currently. Fortunately there's no evidence of any overall worsening this is going very slow I think cental would be something that would be helpful for him although he states that $50 for tube is quite expensive. He therefore is not willing to get that at this point. 10/06/18 on evaluation today patient actually appears to be doing decently well in regard to his left lateral leg ulcer. He's been tolerating the dressing changes without complication. Fortunately there's no signs of active infection at this time. Overall I'm actually rather pleased with the progress he's making although it's slow he doesn't show any signs of infection and he does seem to be making some improvement. I do believe that he may need a switch up and dressings to try to help this to heal more appropriately and quickly. 10/19/18 on evaluation today patient actually  appears to be doing better in regard to his left lateral lower extremity ulcer. This is shown signs of having much less Slough buildup at this point due to the fact he has been using the [employer]. Obviously this is very good news. The overall size of the wound is not dramatically smaller but again the appearance is. 11/02/18 on evaluation today patient actually appears to be doing quite well in regard to his lower [place] ulcer. A lot of the skin around the ulcer is actually somewhat irritating at this point this seems to be more due to the dressing causing irritation from the adhesive that anything else. Fortunately there is no signs of active infection at this time. 11/24/18 on evaluation today patient appears to be doing a little worse in regard to his overall appearance of his lower extremity [name], [name] ([id number]) ulcer. There's more erythema and warmth  around the wound unfortunately. He is currently on doxycycline which he has been on for some time. With that being said I'm not sure that seems to be helping with what appears to possibly be an acute cellulitis with regard to his left lower extremity ulcer. No fevers, chills, nausea, or vomiting noted at this time. 12/08/18 on evaluation today patient's wounds actually appears to be doing significantly better compared to his last evaluation. He has been using Santyl along with alternating tripling about appointment as well as the steroid cream seems to be doing quite well and the wound is showing signs of improvement which is excellent news. Fortunately there's no evidence of infection and in fact his culture came back negative with only normal skin flora noted. 12/21/2018 upon evaluation today patient actually appears to be doing excellent with regard to his ulcer. This is actually the best that I have seen it since have been helping to take care of him. It is both smaller as well as less slough noted on the surface of the wound and seems to be  showing signs of good improvement with new skin growing from the edges. He has been using just the triamcinolone he does wonder if he can get a refill of that ointment today. 01/04/2019 upon evaluation today patient actually appears to be doing well with regard to his left lateral lower extremity ulcer. With that being said it does not appear to be that he is doing quite as well as last time as far as progression is concerned. There does not appear to be any signs of infection or significant irritation which is good news. With that being said I do believe that he may benefit from switching to a collagen based dressing based on how clean The wound appears. 01/18/2019 on evaluation today patient actually appears to be doing well with regard to his wound on the left lower extremity. He is not made a lot of progress compared to where we were previous but nonetheless does seem to be doing okay at this time which is good news. There is no signs of active infection which is also good news. My only concern currently is I do wish we can get him into utilizing the collagen dressing his insurance would not pay for the supplies that we ordered although it appears that he may be able to order this through his supply company that he typically utilizes. This is [employer]. Nonetheless he did try to order it during the office visit today and it appears this did go through. We will see if he can get that it is a different brand but nonetheless he has collagen and I do think will be beneficial. 02/01/2019 on evaluation today patient actually appears to be doing a little worse today in regard to the overall size of his wounds. Fortunately there is no signs of active infection at this time. That is visually. Nonetheless when this is happened before it was due to infection. For that reason were somewhat concerned about that this time as well. 02/08/2019 on evaluation today patient unfortunately appears to be doing slightly  worse with regard to his wound upon evaluation today. Is measuring a little deeper and a little larger unfortunately. I am not really sure exactly what is causing this to enlarge he actually did see his dermatologist she is going to see about initiating Humira for him. Subsequently she also did do steroid injections into the wound itself in the periphery. Nonetheless still nonetheless he seems to  be getting a little bit larger he is gone back to just using the steroid cream topically which I think is appropriate. I would say hold off on the collagen for the time being is definitely a good thing to do. Based on the culture results which we finally did get the final result back regarding it shows staph as the bacteria noted again that can be a normal skin bacteria based on the fact however he is having increased drainage and worsening of the wound measurement wise I would go ahead and place him on an antibiotic today I do believe for this. 02/15/2019 on evaluation today patient actually appears to be doing somewhat better in regard to his ulcer. There is no signs of worsening at this time I did review his culture results which showed evidence of Staphylococcus aureus but not MRSA. Again this could just be more related to the normal skin bacteria although he states the drainage has slowed down quite a bit he may have had a mild infection not just colonization. And was much smaller and then since around10/04/2019 on evaluation today patient appears to be doing unfortunately worse as far as the size of the wound. I really feel like that this is steadily getting larger again it had been doing excellent right at the beginning of September we have seen a steady increase in the area of the wound it is almost 2-1/2 times the size it was on September 1. Obviously this is a bad trend this is not wanting to see. For that reason we went back to using just the topical triamcinolone cream which does seem to help  with inflammation. I checked him for bacteria by way of culture and nothing showed positive there. I am considering giving him a short course of a tapering steroid Dosepak today to see if that is can be beneficial for him. The patient is in agreement with giving that a try. 03/08/2019 on evaluation today patient appears to be doing very well in comparison to last evaluation with regard to his lower extremity ulcer. This is showing signs of less inflammation and actually measuring slightly smaller compared to last time every other week over the past month and a half he has been measuring larger larger larger. Nonetheless I do believe that the issue has been inflammation the prednisone does seem to have been beneficial for him which is good news. No fevers, chills, nausea, vomiting, or diarrhea. ISMAEL, KARGE ([id number]) 03/22/2019 on evaluation today patient appears to be doing about the same with regard to his leg ulcer. He has been tolerating the dressing changes without complication. With that being said the wound seems to be mostly arrested at its current size but really is not making any progress except for when we prescribed the prednisone. He did show some signs of dropping as far as the overall size of the wound during that interval week. Nonetheless this is something he is not on long-term at this point and unfortunately I think he is getting need either this or else the Humira which his dermatologist has discussed try to get approval for. With that being said he will be seeing his dermatologist on the 11th of this month that is November. 04/19/2019 on evaluation today patient appears to be doing really about the same the wound is measuring slightly larger compared to last time I saw him. He has not been into the office since November 2 due to the fact that he unfortunately had Covid as that his  entire family. He tells me that it was rough but they did pull-through and he seems to be doing  much better. Fortunately there is no signs of active infection at this time. No fevers, chills, nausea, vomiting, or diarrhea. 05/10/2019 on evaluation today patient unfortunately appears to be doing significantly worse as compared to last time I saw him. He does tell me that he has had his first dose of Humira and actually is scheduled to get the next one in the upcoming week. With that being said he tells me also that in the past several days he has been having a lot of issues with green drainage she showed me a picture this is more blue-green in color. He is also been having issues with increased sloughy buildup and the wound does appear to be larger today. Obviously this is not the direction that we want everything to take based on the starting of his Humira. Nonetheless I think this is definitely a result of likely infection and to be honest I think this is probably Pseudomonas causing the infection based on what I am seeing. 05/24/2019 on evaluation today patient unfortunately appears to be doing significantly worse compared to his prior evaluation with me 2 weeks ago. I did review his culture results which showed that he does have Staph aureus as well as Pseudomonas noted on the culture. Nonetheless the Levaquin that I prescribed for him does not appear to have been appropriate and in fact he tells me he is no longer experiencing the green drainage and discharge that he had at the last visit. Fortunately there is no signs of active infection at this time which is good news although the wound has significantly worsened it in fact is much deeper than it was previous. We have been utilizing up to this point triamcinolone ointment as the prescription topical of choice but at this time I really feel like that the wound is getting need to be packed in order to appropriately manage this due to the deeper nature of the wound. Therefore something along the lines of an alginate dressing may be more  appropriate. Patient History Information obtained from Patient. Family History Diabetes - Mother,Maternal Grandparents, Heart Disease - Mother,Maternal Grandparents, Hereditary Spherocytosis - Siblings, Hypertension - Maternal Grandparents, Stroke - Siblings, No family history of Cancer, Kidney Disease, Lung Disease, Seizures, Thyroid Problems, Tuberculosis. Social History Current some day smoker, Marital Status - Married, Alcohol Use - Rarely, Drug Use - No History, Caffeine Use - Moderate. Medical History Eyes Denies history of Cataracts, Glaucoma, Optic Neuritis Ear/Nose/Mouth/Throat Denies history of Chronic sinus problems/congestion, Middle ear problems Hematologic/Lymphatic Denies history of Anemia, Hemophilia, Human Immunodeficiency Virus, Lymphedema, Sickle Cell Disease Respiratory Patient has history of Sleep Apnea - cpap Denies history of Aspiration, Asthma, Chronic Obstructive Pulmonary Disease (COPD), Pneumothorax Cardiovascular Patient has history of Hypertension Denies history of Angina, Arrhythmia, Congestive Heart Failure, Coronary Artery Disease, Deep Vein Thrombosis, Hypotension, Myocardial Infarction, Peripheral Arterial Disease, Peripheral Venous Disease, Phlebitis, Vasculitis Gastrointestinal Patient has history of Colitis Endocrine Denies history of Type I Diabetes, Type II Diabetes Genitourinary Barre, Nicko ([id number]) Denies history of End Stage Renal Disease Immunological Denies history of Lupus Erythematosus, Raynaud s, Scleroderma Integumentary (Skin) Denies history of History of Burn, History of pressure wounds Musculoskeletal Denies history of Gout, Rheumatoid Arthritis, Osteoarthritis, Osteomyelitis Neurologic Denies history of Dementia, Neuropathy, Quadriplegia, Paraplegia, Seizure Disorder Oncologic Denies history of Received Chemotherapy, Received Radiation Psychiatric Denies history of Anorexia/bulimia, Confinement Anxiety Review of  Systems (ROS) Constitutional  Symptoms (General Health) Denies complaints or symptoms of Fatigue, Fever, Chills, Marked Weight Change. Respiratory Denies complaints or symptoms of Chronic or frequent coughs, Shortness of Breath. Cardiovascular Complains or has symptoms of LE edema. Denies complaints or symptoms of Chest pain. Psychiatric Denies complaints or symptoms of Anxiety, Claustrophobia. Objective Constitutional Well-nourished and well-hydrated in no acute distress. Vitals Time Taken: 8:10 AM, Height: 71 in, Weight: 338 lbs, BMI: 47.1, Temperature: 99.2 F, Pulse: 96 bpm, Respiratory Rate: 18 breaths/min, Blood Pressure: 155/92 mmHg. Respiratory normal breathing without difficulty. Psychiatric this patient is able to make decisions and demonstrates good insight into disease process. Alert and Oriented x 3. pleasant and cooperative. General Notes: Patient's wound bed upon inspection today does appear to be showing signs of worsening there is muscle exposure there is also undermining and again the wound is deeper compared to what it was previous. We are going to need to pack something in to fill the space at this point and I think he be better served by an alginate dressing as opposed to the ointment at this time. No sharp debridement was performed today although honestly there did not appear to be a need for this I am just more concerned in general with the fact that the patient seems to be each week getting worse not showing any signs of improvement. Integumentary (Hair, Skin) Wound #1 status is Open. Original cause of wound was Gradually Appeared. The wound is located on the Left,Lateral Lower Leg. The wound measures 5.5cm length x 4.8cm width x 0.7cm depth; 20.735cm^2 area and 14.514cm^3 volume. There is Maddocks, Eero ([id number]) muscle and Fat Layer (Subcutaneous Tissue) Exposed exposed. There is no tunneling or undermining noted. There is a medium amount of serous drainage  noted. The wound margin is distinct with the outline attached to the wound base. There is small (1-33%) pink granulation within the wound bed. There is a large (67-100%) amount of necrotic tissue within the wound bed including Adherent Slough. 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 Anesthetic (add to Medication List): Wound #1 Left,Lateral Lower Leg: Topical Lidocaine 4% cream applied to wound bed prior to debridement (In Clinic Only). Primary Wound Dressing: Wound #1 Left,Lateral Lower Leg: Alginate - Plain Alginate Secondary Dressing: Wound #1 Left,Lateral Lower Leg: ABD pad Dry Gauze Dressing Change Frequency: Wound #1 Left,Lateral Lower Leg: Change dressing every other day. Follow-up Appointments: Wound #1 Left,Lateral Lower Leg: Return Appointment in 1 week. Edema Control: Wound #1 Left,Lateral Lower Leg: Elevate legs to the level of the heart and pump ankles as often as possible 1. At this point with regard to the patient's wound on the left lower extremity my suggestion again is going to be to switch to a plain alginate dressing due to the fact that the wound does appear to need to be packed in order to fill some of the deeper space and undermining I think this is good be a more appropriate way to go. This was discussed with the patient today. 2. Also discussed with the patient that I do feel like that he is progressing in regard to the overall appearance of his wound his dermatologist has made referral for second opinion at the what sounds to be wound care center at [facility]. With that being said the patient's not exactly sure who he had  seen we will find out more details come next week. Nonetheless I do not think any issues  with a second opinion come to mind as far as I am concerned in fact it may be good to get another set of Sharon ([id number]) eyes on this since for some reason he seems to have dramatically worsened over the past several weeks and even honestly the past 1-2 months compared to where he was just a few months ago where I thought this was very close to healing. 3. The patient is still continuing to see his dermatologist he is currently on immune modulating therapy to try to help with the pyoderma gangrenosum. With that being said she is concerned about the fact the patient does not seem to be improving and at this point I am equally concerned and have been for several weeks. For that reason she is making a referral to what may be the wound care center at [facility] we will see what they have to say as well. We will see patient back for reevaluation in 1 week here in the clinic. If anything worsens or changes patient will contact our office for additional recommendations. Electronic Signature(s) Signed: 05/25/2019 8:13:34 AM By: Worthy Keeler PA-C Previous Signature: 05/25/2019 8:12:20 AM Version By: Worthy Keeler PA-C Entered By: Worthy Keeler on 05/25/2019 08:13:34 JAMIESON, HETLAND ([id number]) -------------------------------------------------------------------------------- ROS/PFSH Details Patient Name: [name] Date of Service: 05/24/2019 8:00 AM Medical Record Number: [id number] Patient Account Number: [id number] Date of Birth/Sex: 1980/06/05 (39 y.o. M) Treating RN: Harold Barban Primary Care Provider: [name] Other Clinician: Referring Provider: [name] Treating Provider/Extender: Melburn Hake, Reagan Behlke Weeks in Treatment: 128 Information Obtained From Patient Constitutional Symptoms (General Health) Complaints and Symptoms: Negative for: Fatigue; Fever; Chills; Marked Weight Change Respiratory Complaints and Symptoms: Negative for: Chronic or frequent coughs;  Shortness of Breath Medical History: Positive for: Sleep Apnea - cpap Negative for: Aspiration; Asthma; Chronic Obstructive Pulmonary Disease (COPD); Pneumothorax Cardiovascular Complaints and Symptoms: Positive for: LE edema Negative for: Chest pain Medical History: Positive for: Hypertension Negative for: Angina; Arrhythmia; Congestive Heart Failure; Coronary Artery Disease; Deep Vein Thrombosis; Hypotension; Myocardial Infarction; Peripheral Arterial Disease; Peripheral Venous Disease; Phlebitis; Vasculitis Psychiatric Complaints and Symptoms: Negative for: Anxiety; Claustrophobia Medical History: Negative for: Anorexia/bulimia; Confinement Anxiety Eyes Medical History: Negative for: Cataracts; Glaucoma; Optic Neuritis Ear/Nose/Mouth/Throat Medical History: Negative for: Chronic sinus problems/congestion; Middle ear problems Hematologic/Lymphatic Medical History: Negative for: Anemia; Hemophilia; Human Immunodeficiency Virus; Lymphedema; Sickle Cell Disease Timpone, Monish ([id number]) Gastrointestinal Medical History: Positive for: Colitis Endocrine Medical History: Negative for: Type I Diabetes; Type II Diabetes Genitourinary Medical History: Negative for: End Stage Renal Disease Immunological Medical History: Negative for: Lupus Erythematosus; Raynaudos; Scleroderma Integumentary (Skin) Medical History: Negative for: History of Burn; History of pressure wounds Musculoskeletal Medical History: Negative for: Gout; Rheumatoid Arthritis; Osteoarthritis; Osteomyelitis Neurologic Medical History: Negative for: Dementia; Neuropathy; Quadriplegia; Paraplegia; Seizure Disorder Oncologic Medical History: Negative for: Received Chemotherapy; Received Radiation Immunizations Pneumococcal Vaccine: Received Pneumococcal Vaccination: No Implantable Devices No devices added Family and Social History Cancer: No; Diabetes: Yes - Mother,Maternal Grandparents; Heart Disease:  Yes - Mother,Maternal Grandparents; Hereditary Spherocytosis: Yes - Siblings; Hypertension: Yes - Maternal Grandparents; Kidney Disease: No; Lung Disease: No; Seizures: No; Stroke: Yes - Siblings; Thyroid Problems: No; Tuberculosis: No; Current some day smoker; Marital Status - Married; Alcohol Use: Rarely; Drug Use: No History; Caffeine Use: Moderate; Financial Concerns: No; Food, Clothing or Shelter Needs: No; Support System Lacking: No; Transportation Concerns:  No Physician Affirmation I have reviewed and agree with the above information. Electronic Signature(s) KENNEY, GOING ([id number]) Signed: 05/25/2019 4:43:12 PM By: Harold Barban Signed: 05/26/2019 6:44:22 PM By: Worthy Keeler PA-C Entered By: Worthy Keeler on 05/25/2019 08:08:30 PLUMER, MITTELSTAEDT ([id number]) -------------------------------------------------------------------------------- SuperBill Details Patient Name: [name] Date of Service: 05/24/2019 Medical Record Number: [id number] Patient Account Number: [id number] Date of Birth/Sex: [birth year] (39 y.o. M) Treating RN: Harold Barban Primary Care Provider: [name] Other Clinician: Referring Provider: [name] Treating Provider/Extender: Melburn Hake, Joachim Carton Weeks in Treatment: 128 Diagnosis Coding ICD-10 Codes Code Description [id number] Non-pressure chronic ulcer of left calf with fat layer exposed L88 Pyoderma gangrenosum I87.2 Venous insufficiency (chronic) (peripheral) L03.116 Cellulitis of left lower limb Facility Procedures CPT4 Code: 17616073 Description: 71062 - WOUND CARE VISIT-LEV 3 EST PT Modifier: Quantity: 1 Physician Procedures CPT4 Code: 6948546 Description: 27035 - WC PHYS LEVEL 4 - EST PT ICD-10 Diagnosis Description L97.222 Non-pressure chronic ulcer of left calf with fat layer expo L88 Pyoderma gangrenosum I87.2 Venous insufficiency (chronic) (peripheral) L03.116 Cellulitis of left lower  limb Modifier: sed Quantity: 1 Electronic  Signature(s) Signed: 05/25/2019 8:13:48 AM By: Worthy Keeler PA-C Entered By: Worthy Keeler on 05/25/2019 08:13:48

## 2019-05-31 ENCOUNTER — Other Ambulatory Visit: Payer: Self-pay

## 2019-05-31 ENCOUNTER — Encounter: Payer: 59 | Admitting: Physician Assistant

## 2019-05-31 ENCOUNTER — Other Ambulatory Visit: Payer: Self-pay | Admitting: Primary Care

## 2019-05-31 DIAGNOSIS — L97222 Non-pressure chronic ulcer of left calf with fat layer exposed: Secondary | ICD-10-CM | POA: Diagnosis not present

## 2019-05-31 DIAGNOSIS — F3342 Major depressive disorder, recurrent, in full remission: Secondary | ICD-10-CM

## 2019-05-31 NOTE — Unmapped (Signed)
This encounter was created in error - please disregard.

## 2019-05-31 NOTE — Progress Notes (Signed)
[name], [name] ([id number]) Visit Report for 05/31/2019 Chief Complaint Document Details Patient Name: [name] Date of Service: 05/31/2019 8:00 AM Medical Record Number: [id number] Patient Account Number: [id number] Date of Birth/Sex: 1980/07/30 (39 y.o. M) Treating RN: [name] Primary Care Provider: [name] Other Clinician: Referring Provider: [name] Treating Provider/Extender: Melburn Hake, Marquisha Nikolov Weeks in Treatment: 129 Information Obtained from: Patient Chief Complaint He is here in follow up evaluation for LLE pyoderma ulcer Electronic Signature(s) Signed: 05/31/2019 4:22:40 PM By: Worthy Keeler PA-C Entered By: Worthy Keeler on 05/31/2019 08:15:47 [name] ([id number]) -------------------------------------------------------------------------------- HPI Details Patient Name: [name] Date of Service: 05/31/2019 8:00 AM Medical Record Number: [id number] Patient Account Number: [id number] Date of Birth/Sex: [birth year] (39 y.o. M) Treating RN: [name] Primary Care Provider: [name] Other Clinician: Referring Provider: [name] Treating Provider/Extender: Melburn Hake, Brittany Amirault Weeks in Treatment: 129 History of Present Illness HPI Description: 12/04/16; 39 year old man who comes into the clinic today for review of a wound on the posterior left calf. He tells me that is been there for about a year. He is not a diabetic he does smoke half a pack per day. He was seen in the ER on 11/20/16 felt to have cellulitis around the wound and was given clindamycin. An x-ray did not show osteomyelitis. The patient initially tells me that he has a milk allergy that sets off a pruritic itching rash on his lower legs which she scratches incessantly and he thinks that's what may have set up the wound. He has been using various topical antibiotics and ointments without any effect. He works in a [employer] and is on his feet all day. He does not have a prior  history of wounds however he does have the rash on both lower legs the right arm and the ventral aspect of his left arm. These are excoriations and clearly have had scratching however there are of macular looking areas on both legs including a substantial larger area on the right leg. This does not have an underlying open area. There is no blistering. The patient tells me that 2 years ago in [place] in response to the rash on his legs he saw a dermatologist who told him he had a condition which may be pyoderma gangrenosum although I may be putting words into his mouth. He seemed to recognize this. 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 He is not in any treatment for this. He's had no recent travel 12/11/16; the patient arrives today with his wound and roughly the same condition we've been using silver alginate this is a deep punched out wound with some surrounding erythema but no tenderness. Biopsy I did did not show confirmed pyoderma gangrenosum suggested nonspecific inflammation and vasculitis but does not provide an actual description of what was seen by the pathologist. I'm really not able to understand this We have also received information from the patient's dermatologist in [place] notes from April 2016. This was a doctor Agarwal- antal. 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 He was prescribed topical steroid high potency under occlusion which helped but at this point the patient did not have a deep punched out wound. 12/18/16; the patient's wound is larger in terms of surface area however this surface looks better and there is less depth. The surrounding erythema also is better. The patient states that the wrap we put on came off 2 days ago when he has been using  his compression stockings. He we are in the process of getting a dermatology consult. 12/26/16 on evaluation today patient's left lower extremity wound shows evidence of  infection with surrounding erythema noted. He has been tolerating the dressing changes but states that he has noted more discomfort. There is a larger area of erythema surrounding the wound. No fevers, chills, nausea, or vomiting noted at this time. With that being said the wound still does have slough covering the surface. He is not allergic to any medication that he is aware of at this point. In regard to his right lower extremity he had several regions that are erythematous and pruritic he wonders if there's anything we can do to help that. 01/02/17 I reviewed patient's wound culture which was obtained his visit last week. He was placed on doxycycline at that point. Unfortunately that does not appear to be an antibiotic that would likely help with the situation however the pseudomonas noted on culture is sensitive to Cipro. Also unfortunately patient's wound seems to have a large compared to last week's evaluation. Not severely so but there are definitely increased measurements in general. He is continuing to have discomfort as well he writes this to be a seven out of 10. In fact he would prefer me not to perform any debridement today due to the fact that he is having discomfort and considering he has an active infection on the little reluctant to do so anyway. No fevers, chills, nausea, or vomiting noted at this time. 01/08/17; patient seems dermatology on September 5. I suspect dermatology will want the slides from the biopsy I did sent to their pathologist. I'm not sure if there is a way we can expedite that. In any case the culture I did before I left on vacation 3 weeks ago showed Pseudomonas he was given 10 days of Cipro and per her description of her intake nurses is actually somewhat better this week although the wound is quite a bit bigger than I remember the last time I saw this. He still has 3 more days of Cipro [name] ([id number]) 01/21/17; dermatology appointment tomorrow. He has  completed the ciprofloxacin for Pseudomonas. Surface of the wound looks better however he is had some deterioration in the lesions on his right leg. Meantime the left lateral leg wound we will continue with sample 01/29/17; patient had his dermatology appointment but I can't yet see that note. He is completed his antibiotics. The wound is more superficial but considerably larger in circumferential area than when he came in. This is in his left lateral calf. He also has swollen erythematous areas with superficial wounds on the right leg and small papular areas on both arms. There apparently areas in her his upper thighs and buttocks I did not look at those. Dermatology biopsied the right leg. Hopefully will have their input next week. 02/05/17; patient went back to see his dermatologist who told him that he had a "scratching problem" as well as staph. He is now on a 30 day course of doxycycline and I believe she gave him triamcinolone cream to the right leg areas to help with the itching [not exactly sure but probably triamcinolone]. She apparently looked at the left lateral leg wound although this was not rebiopsied and I think felt to be ultimately part of the same pathogenesis. He is using sample border foam and changing nevus himself. He now has a new open area on the right posterior leg which was his biopsy site I don't  have any of the dermatology notes 02/12/17; we put the patient in compression last week with SANTYL to the wound on the left leg and the biopsy. Edema is much better and the depth of the wound is now at level of skin. Area is still the same oBiopsy site on the right lateral leg we've also been using santyl with a border foam dressing and he is changing this himself. 02/19/17; Using silver alginate started last week to both the substantial left leg wound and the biopsy site on the right wound. He is tolerating compression well. Has a an appointment with his primary M.D. tomorrow  wondering about diuretics although I'm wondering if the edema problem is actually lymphedema 02/26/17; the patient has been to see his primary doctor Dr. Jerrel Ivory at [facility] our primary care. She started him on Lasix 20 mg and this seems to have helped with the edema. However we are not making substantial change with the left lateral calf wound and inflammation. The biopsy site on the right leg also looks stable but not really all that different. 03/12/17; the patient has been to see vein and vascular Dr. Lucky Cowboy. He has had venous reflux studies I have not reviewed these. I did get a call from his dermatology office. 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 They therefore recommended clobetasol and tetracycline i.e. 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 03/26/17; apparently the patient just had reflux studies not an appointment with Dr. dew. She arrives in clinic today having applied clobetasol for 2-3 weeks. He notes over the last 2-3 days excessive drainage having to change the dressing 3-4 times a day and also expanding erythema. 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 Lillia Carmel at [facility] dermatology locally who kindly saw him at my request. 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 My concern was the degree of erythema medially extending from his left leg wound which was either inflammation from  pyoderma or cellulitis. I put him on Augmentin however culture of the wound showed Pseudomonas which is quinolone sensitive. I really don't believe he has cellulitis however in view of everything I will continue and give him a course of Cipro. He is also on doxycycline as an immune modulator for the pyoderma. In addition to his original wound on the left lateral leg with surrounding erythema he has a wound on the right posterior calf which was an original biopsy site done by dermatology. 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 Saw Dr. Lillia Carmel yesterday. He has been using topical antibiotics to both wound areas his original wound on the left and the biopsies/pathergy area on the right. There is definitely some improvement in the inflammation around the wound on the right although the patient states he has increasing sensitivity of the wounds. He is on prednisone 60 and doxycycline 1 as prescribed by Dr. Lillia Carmel. He is covering the topical  antibiotic with gauze and putting this in his own compression stocks and changing this daily. He states that Dr. Lottie Rater did a culture of the left leg wound yesterday 05/07/17; pyoderma gangrenosum. The patient saw Dr. Lillia Carmel yesterday and has a follow-up with her in one month. He is still using topical antibiotics to both wounds although he can't recall exactly what type. He is still on prednisone 60 mg. Dr. Lillia Carmel stated that the doxycycline could stop if we were in agreement. 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 The right medial leg was induced by biopsy/pathergy. The area on the right is essentially healed. 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 The right medial leg has closed and  remains closed. He is still on prednisone 60. oHe tells me he missed his last dermatology appointment with Dr. Lillia Carmel but will make another appointment. He reports that her blood sugar at a recent screen in [place] was high 200's. He was 180 today. He is more cushingoid blood pressure is Drummer, Braven ([id number]) up a bit. I think he is going to require still much longer prednisone perhaps another 3 months before attempting to taper. In the meantime his wound is a lot better. Smaller. He is cleaning this off daily and applying topical antibiotics. When he was last in the clinic I thought about changing to [facility] and actually put in a couple of calls to dermatology although probably not during their business hours. 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 He continues on oral prednisone. He has been using triple antibiotic ointment. There is surface debris and we will transition to [facility] and have him return in 2 weeks. He has lost 30 pounds since his last appointment with lifestyle modification. He may benefit from topical steroid cream for treatment this can be considered at a later date. 08/22/17 on evaluation today patient appears to actually be doing rather well in regard to his left lateral lower extremity ulcer. He has actually been managed by Dr. Dellia Nims most recently. Patient is currently on oral steroids at this time. This seems to have been of benefit for him. Nonetheless his last visit was actually with Leah on 08/06/17. Currently he is not utilizing any topical steroid creams although this could be of benefit as well. No fevers, chills, nausea, or vomiting noted at this time. 09/05/17 on evaluation today patient appears to be doing better in regard to his left lateral lower extremity ulcer. He has been tolerating the dressing changes without complication.  He is using Santyl with good effect. Overall I'm very pleased with how things are standing at this point. Patient likewise is happy that this is doing better. 09/19/17 on evaluation today patient actually appears to be doing rather well in regard to his left lateral lower extremity ulcer. 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 He's not having any significant pain. 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 He has been tolerating the Santyl without complication and in general I feel like he's making good  progress. 10/17/17 on evaluation today patient appears to be doing very well in regard to his left lateral lower surety ulcer. He has been tolerating the dressing changes without complication. There does not appear to be any evidence of infection he's alternating the Santyl and the triple antibiotic ointment every other day this seems to be doing well for him. 11/03/17 on evaluation today patient appears to be doing very well in regard to his left lateral lower extremity ulcer. He is been tolerating the dressing changes without complication which is good news. Fortunately there does not appear to be any evidence of infection which is also great news. Overall is doing excellent they are starting to taper down on the prednisone is down to 40 mg at this point it also started topical clobetasol for him. 11/17/17 on evaluation today patient appears to be doing well in regard to his left lateral lower surety ulcer. He's been tolerating the dressing changes without complication. He does note that he is having no pain, no excessive drainage or discharge, and overall he feels like things are going about how he would expect and hope they would. Overall he seems to have no evidence of infection at this time in my opinion which is  good news. 12/04/17-He is seen in follow-up evaluation for right lateral lower extremity ulcer. He has been applying topical steroid cream. Today's measurement show slight increase in size. Over the next 2 weeks we will transition to every other day Santyl and steroid cream. He has been encouraged to monitor for changes and notify clinic with any concerns 12/15/17 on evaluation today patient's left lateral motion the ulcer and fortunately is doing worse again at this point. This just since last week to this week has close to doubled in size according to the patient. I did not seeing last week's I do not have a visual to compare this to in our system was also down so we do not have all the charts and at this point. Nonetheless it does have me somewhat concerned in regard to the fact that again he was worried enough about it he has contact the dermatology that placed them back on the full strength, 50 mg a day of the prednisone that he was taken previous. He continues to alternate using clobetasol along with Santyl at this point. He is obviously somewhat frustrated. 12/22/17 on evaluation today patient appears to be doing a little worse compared to last evaluation. Unfortunately the wound is a little deeper and slightly larger than the last week's evaluation. With that being said he has made some progress in regard to the irritation surrounding at this time unfortunately despite that progress that's been made he still has a significant issue going on here. 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 For that reason I discussed with him today the possibility of perform a wound culture to ensure there's nothing overtly infected. 01/06/18 on evaluation today patient's wound is larger and deeper than previously evaluated. With that being said it did appear that  his wound was infected after my last evaluation with him. Subsequently I did end up prescribing a prescription for Bactrim DS which she has been taking and having no complication with. Fortunately there does not appear to be any evidence of Heying, Iseah ([id number]) infection at this point in time as far as anything spreading, no want to  touch, and overall I feel like things are showing signs of improvement. 01/13/18 on evaluation today patient appears to be even a little larger and deeper than last time. There still muscle exposed in the base of the wound. Nonetheless he does appear to be less erythematous I do believe inflammation is calming down also believe the infection looks like it's probably resolved at this time based on what I'm seeing. No fevers, chills, nausea, or vomiting noted at this time. 01/30/18 on evaluation today patient actually appears to visually look better for the most part. Unfortunately those visually this looks better he does seem to potentially have what may be an abscess in the muscle that has been noted in the central portion of the wound. This is the first time that I have noted what appears to be fluctuance in the central portion of the muscle. With that being said I'm somewhat more concerned about the fact that this might indicate an abscess formation at this location. I do believe that an ultrasound would be appropriate. This is likely something we need to try to do as soon as possible. He has been switch to mupirocin ointment and he is no longer using the steroid ointment as prescribed by dermatology he sees them again next week he's been decreased from 60 to 40 mg of prednisone. 03/09/18 on evaluation today patient actually appears to be doing a little better compared to last time I saw him. There's not as much erythema surrounding the wound itself. He I did review his most recent infectious disease note which was dated 02/24/18. He saw Dr. Michel Bickers in  [place]. 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 Patient is now off of antibiotics and they are continually observing this. There seems to be no change in the past two weeks in my pinion based on what the patient says and what I see today compared to what Dr. Megan Salon likely saw two weeks ago. No fevers, chills, nausea, or vomiting noted at this time. 03/23/18 on evaluation today patient's wound actually appears to be showing signs of improvement which is good news. He is currently still on the Dapsone. He is also working on tapering the prednisone to get off of this and Dr. Lottie Rater is working with him in this regard. Nonetheless overall I feel like the wound is doing well it does appear based on the infectious disease note that I reviewed from Dr. 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 I did also review the results of his ultrasound of left lower extremity which revealed there was a dentist tissue in the base of the wound without an abscess noted. 04/06/18 on evaluation today the patient's left lateral lower extremity ulcer actually appears to be doing fairly well which is excellent news. There does not appear to be any evidence of infection at this time which is also great news. Overall he still does have a significantly large ulceration although little by little he seems to be making progress. He is down to 10 mg a day of the prednisone. 04/20/18 on evaluation today patient actually appears to be doing excellent at this time in regard to his left lower extremity ulcer. He's making signs of good progress unfortunately this is taking much longer than we would really like  to see but nonetheless he is making progress. Fortunately there does not appear to be any evidence of infection at this  time. No fevers, chills, nausea, or vomiting noted at this time. The patient has not been using the Santyl due to the cost he hadn't got in this field yet. He's mainly been using the antibiotic ointment topically. Subsequently he also tells me that he really has not been scrubbing in the shower I think this would be helpful again as I told him it doesn't have to be anything too aggressive to even make it believe just enough to keep it free of some of the loose slough and biofilm on the wound surface. 05/11/18 on evaluation today patient's wound appears to be making slow but sure progress in regard to the left lateral lower extremity ulcer. He is been tolerating the dressing changes without complication. Fortunately there does not appear to be any evidence of infection at this time. He is still just using triple antibiotic ointment along with clobetasol occasionally over the area. He never got the Santyl and really does not seem to intend to in my pinion. 06/01/18 on evaluation today patient appears to be doing a little better in regard to his left lateral lower extremity ulcer. He states that overall he does not feel like he is doing as well with the Dapsone as he did with the prednisone. Nonetheless he sees his dermatologist later today and is gonna talk to them about the possibility of going back on the prednisone. Overall again I believe that the wound would be better if you would utilize Santyl but he really does not seem to be interested in going back to the [place] at this point. He has been using triple antibiotic ointment. 06/15/18 on evaluation today patient's wound actually appears to be doing about the same at this point. Fortunately there is no signs of infection at this time. He has made slight improvements although he continues to not really want to clean the wound bed at this point. He states that he just doesn't mess with it he doesn't want to cause any problems with everything else  he has going on. He has been on medication, antibiotics as prescribed by his dermatologist, for a staff infection of his lower extremities which is really drying out now and looking much better he tells me. Fortunately there is no sign of overall infection. [name], [name] ([id number]) 06/29/18 on evaluation today patient appears to be doing well in regard to his left lateral lower surety ulcer all things considering. Fortunately his staff infection seems to be greatly improved compared to previous. He has no signs of infection and this is drying up quite nicely. He is still the doxycycline for this is no longer on cental, Dapsone, or any of the other medications. His dermatologist has recommended possibility of an infusion but right now he does not want to proceed with that. 07/13/18 on evaluation today patient appears to be doing about the same in regard to his left lateral lower surety ulcer. Fortunately there's no signs of infection at this time which is great news. Unfortunately he still builds up a significant amount of Slough/biofilm of the surface of the wound he still is not really cleaning this as he should be appropriately. Again I'm able to easily with saline and gauze remove the majority of this on the surface which if you would do this at home would likely be a dramatic improvement for him as far as getting the area  to improve. Nonetheless overall I still feel like he is making progress is just very slow. I think Santyl will be of benefit for him as well. Still he has not gotten this as of this point. 07/27/18 on evaluation today patient actually appears to be doing little worse in regards of the erythema around the periwound region of the wound he also tells me that he's been having more drainage currently compared to what he was experiencing last time I saw him. He states not quite as bad as what he had because this was infected previously but nonetheless is still appears to be doing  poorly. Fortunately there is no evidence of systemic infection at this point. The patient tells me that he is not going to be able to afford the Santyl. He is still waiting to hear about the infusion therapy with his dermatologist. Apparently she wants an updated colonoscopy first. 08/10/18 on evaluation today patient appears to be doing better in regard to his left lateral lower extremity ulcer. Fortunately he is showing signs of improvement in this regard he's actually been approved for Remicade infusion's as well although this has not been scheduled as of yet. Fortunately there's no signs of active infection at this time in regard to the wound although he is having some issues with infection of the right lower extremity is been seen as dermatologist for this. Fortunately they are definitely still working with him trying to keep things under control. 09/07/18 on evaluation today patient is actually doing rather well in regard to his left lateral lower extremity ulcer. He notes these actually having some hair grow back on his extremity which is something he has not seen in years. He also tells me that the pain is really not giving them any trouble at this time which is also good news overall she is very pleased with the progress he's using a combination of the mupirocin along with the probate is all mixed. 09/21/18 on evaluation today patient actually appears to be doing fairly well all things considered in regard to his looks from the ulcer. He's been tolerating the dressing changes without complication. Fortunately there's no signs of active infection at this time which is good news he is still on all antibiotics or prevention of the staff infection. He has been on prednisone for time although he states it is gonna contact his dermatologist and see if she put them on a short course due to some irritation that he has going on currently. Fortunately there's no evidence of any overall worsening this is  going very slow I think cental would be something that would be helpful for him although he states that $50 for tube is quite expensive. He therefore is not willing to get that at this point. 10/06/18 on evaluation today patient actually appears to be doing decently well in regard to his left lateral leg ulcer. He's been tolerating the dressing changes without complication. Fortunately there's no signs of active infection at this time. Overall I'm actually rather pleased with the progress he's making although it's slow he doesn't show any signs of infection and he does seem to be making some improvement. I do believe that he may need a switch up and dressings to try to help this to heal more appropriately and quickly. 10/19/18 on evaluation today patient actually appears to be doing better in regard to his left lateral lower extremity ulcer. This is shown signs of having much less Slough buildup at this point due to  the fact he has been using the Santyl. Obviously this is very good news. The overall size of the wound is not dramatically smaller but again the appearance is. 11/02/18 on evaluation today patient actually appears to be doing quite well in regard to his lower [place] ulcer. A lot of the skin around the ulcer is actually somewhat irritating at this point this seems to be more due to the dressing causing irritation from the adhesive that anything else. Fortunately there is no signs of active infection at this time. 11/24/18 on evaluation today patient appears to be doing a little worse in regard to his overall appearance of his lower extremity ulcer. There's more erythema and warmth around the wound unfortunately. He is currently on doxycycline which he has been on for some time. With that being said I'm not sure that seems to be helping with what appears to possibly be an acute cellulitis with regard to his left lower extremity ulcer. No fevers, chills, nausea, or vomiting noted at this  time. 12/08/18 on evaluation today patient's wounds actually appears to be doing significantly better compared to his last evaluation. He has been using Santyl along with alternating tripling about appointment as well as the steroid cream seems to be doing [name], [name] ([id number]) quite well and the wound is showing signs of improvement which is excellent news. Fortunately there's no evidence of infection and in fact his culture came back negative with only normal skin flora noted. 12/21/2018 upon evaluation today patient actually appears to be doing excellent with regard to his ulcer. This is actually the best that I have seen it since have been helping to take care of him. It is both smaller as well as less slough noted on the surface of the wound and seems to be showing signs of good improvement with new skin growing from the edges. He has been using just the triamcinolone he does wonder if he can get a refill of that ointment today. 01/04/2019 upon evaluation today patient actually appears to be doing well with regard to his left lateral lower extremity ulcer. With that being said it does not appear to be that he is doing quite as well as last time as far as progression is concerned. There does not appear to be any signs of infection or significant irritation which is good news. With that being said I do believe that he may benefit from switching to a collagen based dressing based on how clean The wound appears. 01/18/2019 on evaluation today patient actually appears to be doing well with regard to his wound on the left lower extremity. He is not made a lot of progress compared to where we were previous but nonetheless does seem to be doing okay at this time which is good news. There is no signs of active infection which is also good news. My only concern currently is I do wish we can get him into utilizing the collagen dressing his insurance would not pay for the supplies that we ordered although  it appears that he may be able to order this through his supply company that he typically utilizes. This is [employer]. Nonetheless he did try to order it during the office visit today and it appears this did go through. We will see if he can get that it is a different brand but nonetheless he has collagen and I do think will be beneficial. 02/01/2019 on evaluation today patient actually appears to be doing a little worse today in regard  to the overall size of his wounds. Fortunately there is no signs of active infection at this time. That is visually. Nonetheless when this is happened before it was due to infection. For that reason were somewhat concerned about that this time as well. 02/08/2019 on evaluation today patient unfortunately appears to be doing slightly worse with regard to his wound upon evaluation today. Is measuring a little deeper and a little larger unfortunately. I am not really sure exactly what is causing this to enlarge he actually did see his dermatologist she is going to see about initiating Humira for him. Subsequently she also did do steroid injections into the wound itself in the periphery. Nonetheless still nonetheless he seems to be getting a little bit larger he is gone back to just using the steroid cream topically which I think is appropriate. I would say hold off on the collagen for the time being is definitely a good thing to do. Based on the culture results which we finally did get the final result back regarding it shows staph as the bacteria noted again that can be a normal skin bacteria based on the fact however he is having increased drainage and worsening of the wound measurement wise I would go ahead and place him on an antibiotic today I do believe for this. 02/15/2019 on evaluation today patient actually appears to be doing somewhat better in regard to his ulcer. There is no signs of worsening at this time I did review his culture results which showed evidence  of Staphylococcus aureus but not MRSA. Again this could just be more related to the normal skin bacteria although he states the drainage has slowed down quite a bit he may have had a mild infection not just colonization. And was much smaller and then since around10/04/2019 on evaluation today patient appears to be doing unfortunately worse as far as the size of the wound. I really feel like that this is steadily getting larger again it had been doing excellent right at the beginning of September we have seen a steady increase in the area of the wound it is almost 2-1/2 times the size it was on September 1. Obviously this is a bad trend this is not wanting to see. For that reason we went back to using just the topical triamcinolone cream which does seem to help with inflammation. I checked him for bacteria by way of culture and nothing showed positive there. I am considering giving him a short course of a tapering steroid Dosepak today to see if that is can be beneficial for him. The patient is in agreement with giving that a try. 03/08/2019 on evaluation today patient appears to be doing very well in comparison to last evaluation with regard to his lower extremity ulcer. This is showing signs of less inflammation and actually measuring slightly smaller compared to last time every other week over the past month and a half he has been measuring larger larger larger. Nonetheless I do believe that the issue has been inflammation the prednisone does seem to have been beneficial for him which is good news. No fevers, chills, nausea, vomiting, or diarrhea. 03/22/2019 on evaluation today patient appears to be doing about the same with regard to his leg ulcer. He has been tolerating the dressing changes without complication. With that being said the wound seems to be mostly arrested at its current size but really is not making any progress except for when we prescribed the prednisone. He did show some  signs  of dropping as far as the overall size of the wound during that interval week. Nonetheless this is something he is not on long-term at this point and unfortunately I think he is getting need either this or else the Humira which his dermatologist has discussed try to get approval for. With that being said he will be seeing his dermatologist on the 11th of this month that is November. [name], [name] ([id number]) 04/19/2019 on evaluation today patient appears to be doing really about the same the wound is measuring slightly larger compared to last time I saw him. He has not been into the office since November 2 due to the fact that he unfortunately had Covid as that his entire family. He tells me that it was rough but they did pull-through and he seems to be doing much better. Fortunately there is no signs of active infection at this time. No fevers, chills, nausea, vomiting, or diarrhea. 05/10/2019 on evaluation today patient unfortunately appears to be doing significantly worse as compared to last time I saw him. He does tell me that he has had his first dose of Humira and actually is scheduled to get the next one in the upcoming week. With that being said he tells me also that in the past several days he has been having a lot of issues with green drainage she showed me a picture this is more blue-green in color. He is also been having issues with increased sloughy buildup and the wound does appear to be larger today. Obviously this is not the direction that we want everything to take based on the starting of his Humira. Nonetheless I think this is definitely a result of likely infection and to be honest I think this is probably Pseudomonas causing the infection based on what I am seeing. 05/24/2019 on evaluation today patient unfortunately appears to be doing significantly worse compared to his prior evaluation with me 2 weeks ago. I did review his culture results which showed that he does have Staph  aureus as well as Pseudomonas noted on the culture. Nonetheless the Levaquin that I prescribed for him does not appear to have been appropriate and in fact he tells me he is no longer experiencing the green drainage and discharge that he had at the last visit. Fortunately there is no signs of active infection at this time which is good news although the wound has significantly worsened it in fact is much deeper than it was previous. We have been utilizing up to this point triamcinolone ointment as the prescription topical of choice but at this time I really feel like that the wound is getting need to be packed in order to appropriately manage this due to the deeper nature of the wound. Therefore something along the lines of an alginate dressing may be more appropriate. 05/31/2019 upon inspection today patient's wound actually showed signs of doing poorly at this point. Unfortunately he just does not seem to be making any good progress despite what we have tried. He actually did go ahead and pick up the Cipro and start taking that as he was noticing more green drainage he had previously completed the Levaquin that I prescribed for him as well. Nonetheless he missed his appointment for the seventh last week on Wednesday with the wound care center and [facility] where his dermatologist referred him. Obviously I do think a second opinion would be helpful at this point especially in light of the fact that the patient  seems to be doing so poorly despite the fact that we have tried everything that I really know how at this point. The only thing that ever seems to have helped him in the past is when he was on high doses of continual steroids that did seem to make a difference for him. 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 Electronic  Signature(s) Signed: 05/31/2019 10:58:12 AM By: Worthy Keeler PA-C Entered By: Worthy Keeler on 05/31/2019 10:58:12 [name], [name] ([id number]) -------------------------------------------------------------------------------- Physical Exam Details Patient Name: [name] Date of Service: 05/31/2019 8:00 AM Medical Record Number: [id number] Patient Account Number: [id number] Date of Birth/Sex: 1980-12-18 (39 y.o. M) Treating RN: [name] Primary Care Provider: [name] Other Clinician: Referring Provider: [name] Treating Provider/Extender: Melburn Hake, Graceson Nichelson Weeks in Treatment: 78 Constitutional Well-nourished and well-hydrated in no acute distress. Respiratory normal breathing without difficulty. Psychiatric this patient is able to make decisions and demonstrates good insight into disease process. Alert and Oriented x 3. pleasant and cooperative. Notes Patient's wound upon inspection appears to be doing significantly worse. I did recommend that he go ahead and take the Cipro which I prescribed to him. I do feel like there may be potentially some infection component although to be perfectly honest I am not really sure that that is the main issue here nonetheless I do not think it will hurt him to take the Cipro currently. He is already started this anyway and feels like it may have given some marginal relief. He had a lot of pain last week prior to starting the Cipro. Electronic Signature(s) Signed: 05/31/2019 10:58:48 AM By: Worthy Keeler PA-C Entered By: Worthy Keeler on 05/31/2019 10:58:48 [name] ([id number]) -------------------------------------------------------------------------------- Physician Orders Details Patient Name: [name] Date of Service: 05/31/2019 8:00 AM Medical Record Number: [id number] Patient Account Number: [id number] Date of Birth/Sex: Oct 19, 1980 (39 y.o. 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 Anesthetic (add to Medication List) Wound #1 Left,Lateral Lower Leg o Topical Lidocaine 4% cream applied to wound bed prior to debridement (In Clinic Only). Skin Barriers/Peri-Wound Care Wound #1 Left,Lateral Lower Leg o Triamcinolone Acetonide Ointment (TCA) Primary Wound Dressing o Dry Gauze Secondary Dressing Wound #1 Left,Lateral Lower Leg o Boardered Foam Dressing Dressing Change Frequency Wound #1 Left,Lateral Lower Leg o Change dressing every other day. Follow-up Appointments Wound #1 Left,Lateral Lower Leg o Return Appointment in 1 week. Edema Control Wound #1 Left,Lateral Lower Leg o Elevate legs to the level of the heart and pump ankles as often as possible Electronic Signature(s) Signed: 05/31/2019 4:22:40 PM By: Hedy Camara, Chelsey ([id number]) Signed: 05/31/2019 5:00:56 PM By: [name], [name], RN, [name], [name], [name] Entered By: [name], [name], RN, [name], [name] on 05/31/2019 08:31:27 [name], [name] ([id number]) -------------------------------------------------------------------------------- Problem List Details Patient Name: [name] Date of Service: 05/31/2019 8:00 AM Medical Record Number: [id number] Patient Account Number: [id number] Date of Birth/Sex: 08/14/1980 (38  y.o. M) Treating RN: [name] Primary Care Provider: [name] Other Clinician: Referring Provider: [name] Treating Provider/Extender: Melburn Hake, Atlantis Delong Weeks in Treatment: 129 Active Problems ICD-10 Evaluated  Encounter Code Description Active Date Today Diagnosis L97.222 Non-pressure chronic ulcer of left calf with fat layer exposed 12/04/2016 No Yes L88 Pyoderma gangrenosum 03/26/2017 No Yes I87.2 Venous insufficiency (chronic) (peripheral) 12/04/2016 No Yes L03.116 Cellulitis of left lower limb 05/24/2019 No Yes Inactive Problems ICD-10 Code Description Active Date Inactive Date L97.213 Non-pressure chronic ulcer of right calf with necrosis of muscle 04/02/2017 04/02/2017 Resolved Problems Electronic Signature(s) Signed: 05/31/2019 4:22:40 PM By: Worthy Keeler PA-C Entered By: Worthy Keeler on 05/31/2019 08:15:35 Zacharia, [name] ([id number]) -------------------------------------------------------------------------------- Progress Note Details Patient Name: [name] Date of Service: 05/31/2019 8:00 AM Medical Record Number: [id number] Patient Account Number: [id number] Date of Birth/Sex: 11-May-1981 (39 y.o. M) Treating RN: [name] Primary Care Provider: [name] Other Clinician: Referring Provider: [name] Treating Provider/Extender: Melburn Hake, Kaiyu Mirabal Weeks in Treatment: 129 Subjective Chief Complaint Information obtained from Patient He is here in follow up evaluation for LLE pyoderma ulcer History of Present Illness (HPI) 12/04/16; 39 year old man who comes into the clinic today for review of a wound on the posterior left calf. He tells me that is been there for about a year. He is not a diabetic he does smoke half a pack per day. He was seen in the ER on 11/20/16 felt to have cellulitis around the wound and was given clindamycin. An x-ray did not show osteomyelitis. The patient initially tells me that he has a milk allergy that sets off a pruritic itching rash on his lower legs which she scratches incessantly and he thinks that's what may have set up the wound. He has been using various topical antibiotics and ointments without any effect. He works in a [employer]ing [employer]  and is on his feet all day. He does not have a prior history of wounds however he does have the rash on both lower legs the right arm and the ventral aspect of his left arm. These are excoriations and clearly have had scratching however there are of macular looking areas on both legs including a substantial larger area on the right leg. This does not have an underlying open area. There is no blistering. The patient tells me that 2 years ago in [place] in response to the rash on his legs he saw a dermatologist who told him he had a condition which may be pyoderma gangrenosum although I may be putting words into his mouth. He seemed to recognize this. 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 He is not in any treatment for this. He's had no recent travel 12/11/16; the patient arrives today with his wound and roughly the same condition we've been using silver alginate this is a deep punched out wound with some surrounding erythema but no tenderness. Biopsy I did did not show confirmed pyoderma gangrenosum suggested nonspecific inflammation and vasculitis but does not provide an actual description of what was seen by the pathologist. I'm really not able to understand this We have also received information from the patient's dermatologist in [place] notes from April 2016. This was a doctor Agarwal- antal. 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 He was prescribed topical steroid high potency under occlusion which helped but at this point the patient did not have a deep punched out wound. 12/18/16; the patient's wound is larger  in terms of surface area however this surface looks better and there is less depth. The surrounding erythema also is better. The patient states that the wrap we put on came off 2 days ago when he has been using his compression stockings. He we are in the process of getting a dermatology consult. 12/26/16 on evaluation today  patient's left lower extremity wound shows evidence of infection with surrounding erythema noted. He has been tolerating the dressing changes but states that he has noted more discomfort. There is a larger area of erythema surrounding the wound. No fevers, chills, nausea, or vomiting noted at this time. With that being said the wound still does have slough covering the surface. He is not allergic to any medication that he is aware of at this point. In regard to his right lower extremity he had several regions that are erythematous and pruritic he wonders if there's anything we can do to help that. 01/02/17 I reviewed patient's wound culture which was obtained his visit last week. He was placed on doxycycline at that point. Unfortunately that does not appear to be an antibiotic that would likely help with the situation however the pseudomonas noted on culture is sensitive to Cipro. Also unfortunately patient's wound seems to have a large compared to last week's evaluation. Not severely so but there are definitely increased measurements in general. He is continuing to have discomfort as well he writes this to be a seven out of 10. In fact he would prefer me not to perform any debridement today due to the fact that he is having discomfort and considering he has an active infection on the little reluctant to do so anyway. No fevers, [name], [name] ([id number]) chills, nausea, or vomiting noted at this time. 01/08/17; patient seems dermatology on September 5. I suspect dermatology will want the slides from the biopsy I did sent to their pathologist. I'm not sure if there is a way we can expedite that. In any case the culture I did before I left on vacation 3 weeks ago showed Pseudomonas he was given 10 days of Cipro and per her description of her intake nurses is actually somewhat better this week although the wound is quite a bit bigger than I remember the last time I saw this. He still has 3 more days of  Cipro 01/21/17; dermatology appointment tomorrow. He has completed the ciprofloxacin for Pseudomonas. Surface of the wound looks better however he is had some deterioration in the lesions on his right leg. Meantime the left lateral leg wound we will continue with sample 01/29/17; patient had his dermatology appointment but I can't yet see that note. He is completed his antibiotics. The wound is more superficial but considerably larger in circumferential area than when he came in. This is in his left lateral calf. He also has swollen erythematous areas with superficial wounds on the right leg and small papular areas on both arms. There apparently areas in her his upper thighs and buttocks I did not look at those. Dermatology biopsied the right leg. Hopefully will have their input next week. 02/05/17; patient went back to see his dermatologist who told him that he had a "scratching problem" as well as staph. He is now on a 30 day course of doxycycline and I believe she gave him triamcinolone cream to the right leg areas to help with the itching [not exactly sure but probably triamcinolone]. She apparently looked at the left lateral leg wound although this was not rebiopsied and  I think felt to be ultimately part of the same pathogenesis. He is using sample border foam and changing nevus himself. He now has a new open area on the right posterior leg which was his biopsy site I don't have any of the dermatology notes 02/12/17; we put the patient in compression last week with SANTYL to the wound on the left leg and the biopsy. Edema is much better and the depth of the wound is now at level of skin. Area is still the same Biopsy site on the right lateral leg we've also been using santyl with a border foam dressing and he is changing this himself. 02/19/17; Using silver alginate started last week to both the substantial left leg wound and the biopsy site on the right wound. He is tolerating compression well.  Has a an appointment with his primary M.D. tomorrow wondering about diuretics although I'm wondering if the edema problem is actually lymphedema 02/26/17; the patient has been to see his primary doctor Dr. Jerrel Ivory at [place] our primary care. She started him on Lasix 20 mg and this seems to have helped with the edema. However we are not making substantial change with the left lateral calf wound and inflammation. The biopsy site on the right leg also looks stable but not really all that different. 03/12/17; the patient has been to see vein and vascular Dr. Lucky Cowboy. He has had venous reflux studies I have not reviewed these. I did get a call from his dermatology office. 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 They therefore recommended clobetasol and tetracycline i.e. 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 03/26/17; apparently the patient just had reflux studies not an appointment with Dr. dew. She arrives in clinic today having applied clobetasol for 2-3 weeks. He notes over the last 2-3 days excessive drainage having to change the dressing 3-4 times a day and also expanding erythema. 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 Lillia Carmel at [facility] dermatology locally who kindly saw him at my request. 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 My concern was the degree of erythema medially extending from  his left leg wound which was either inflammation from pyoderma or cellulitis. I put him on Augmentin however culture of the wound showed Pseudomonas which is quinolone sensitive. I really don't believe he has cellulitis however in view of everything I will continue and give him a course of Cipro. He is also on doxycycline as an immune modulator for the pyoderma. In addition to his original wound on the left lateral leg with surrounding erythema he has a wound on the right posterior calf which was an original biopsy site done by dermatology. 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 Saw Dr. Lillia Carmel yesterday. He has been using topical antibiotics to both wound areas his original wound on the left and the biopsies/pathergy area on the right. There is definitely  some improvement in the inflammation around the wound on the right although the patient states he has increasing sensitivity of the wounds. He is on prednisone 60 and doxycycline 1 as prescribed by Dr. Lillia Carmel. He is covering the topical antibiotic with gauze and putting this in his own compression stocks and changing this daily. He states that Dr. Lottie Rater did a culture of the left leg wound yesterday 05/07/17; pyoderma gangrenosum. The patient saw Dr. Lillia Carmel yesterday and has a follow-up with her in one month. He is still using topical antibiotics to both wounds although he can't recall exactly what type. He is still on prednisone 60 mg. Dr. Lillia Carmel stated that the doxycycline could stop if we were in agreement. 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 The right medial leg was induced by [name] ([id number]) biopsy/pathergy. The area on the right is essentially healed. Still on high-dose prednisone using topical antibiotics to the wound 07/09/17; pyoderma  gangrenosum with wounds on the left lateral leg. The right medial leg has closed and remains closed. He is still on prednisone 60. He tells me he missed his last dermatology appointment with Dr. Lillia Carmel but will make another appointment. He reports that her blood sugar at a recent screen in [place] was high 200's. He was 180 today. 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 I think he is going to require still much longer prednisone perhaps another 3 months before attempting to taper. In the meantime his wound is a lot better. Smaller. He is cleaning this off daily and applying topical antibiotics. When he was last in the clinic I thought about changing to [name] and actually put in a couple of calls to dermatology although probably not during their business hours. 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 He continues on oral prednisone. He has been using triple antibiotic ointment. There is surface debris and we will transition to [facility] and have him return in 2 weeks. He has lost 30 pounds since his last appointment with lifestyle modification. He may benefit from topical steroid cream for treatment this can be considered at a later date. 08/22/17 on evaluation today patient appears to actually be doing rather well in regard to his left lateral lower extremity ulcer. He has actually been managed by Dr. Dellia Nims most recently. Patient is currently on oral steroids at this time. This seems to have been of benefit for him. Nonetheless his last visit was actually with Leah on 08/06/17. Currently he is not utilizing any topical steroid creams although this could be of benefit as well. No fevers, chills, nausea, or vomiting noted at this time. 09/05/17 on evaluation today patient appears to be doing better in regard to his left lateral lower extremity ulcer. He has  been tolerating the dressing changes without complication. He is using Santyl with good effect. Overall I'm very pleased with how things are standing at this point. Patient likewise is happy that this is doing better. 09/19/17 on evaluation today patient actually appears to be doing rather well in regard to his left lateral lower extremity ulcer. 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 He's not having any significant pain. 10/03/17  on evaluation today patient appears to be doing excellent in regard to his lower extremity wound on the left secondary to Pyoderma gangrenosum. He has been tolerating the Santyl without complication and in general I feel like he's making good progress. 10/17/17 on evaluation today patient appears to be doing very well in regard to his left lateral lower surety ulcer. He has been tolerating the dressing changes without complication. There does not appear to be any evidence of infection he's alternating the Santyl and the triple antibiotic ointment every other day this seems to be doing well for him. 11/03/17 on evaluation today patient appears to be doing very well in regard to his left lateral lower extremity ulcer. He is been tolerating the dressing changes without complication which is good news. Fortunately there does not appear to be any evidence of infection which is also great news. Overall is doing excellent they are starting to taper down on the prednisone is down to 40 mg at this point it also started topical clobetasol for him. 11/17/17 on evaluation today patient appears to be doing well in regard to his left lateral lower surety ulcer. He's been tolerating the dressing changes without complication. He does note that he is having no pain, no excessive drainage or discharge, and overall he feels like things are going about how he would expect and hope they would. Overall he seems to have  no evidence of infection at this time in my opinion which is good news. 12/04/17-He is seen in follow-up evaluation for right lateral lower extremity ulcer. He has been applying topical steroid cream. Today's measurement show slight increase in size. Over the next 2 weeks we will transition to every other day Santyl and steroid cream. He has been encouraged to monitor for changes and notify clinic with any concerns 12/15/17 on evaluation today patient's left lateral motion the ulcer and fortunately is doing worse again at this point. This just since last week to this week has close to doubled in size according to the patient. I did not seeing last week's I do not have a visual to compare this to in our system was also down so we do not have all the charts and at this point. Nonetheless it does have me somewhat concerned in regard to the fact that again he was worried enough about it he has contact the dermatology that placed them back on the full strength, 50 mg a day of the prednisone that he was taken previous. He continues to alternate using clobetasol along with Santyl at this point. He is obviously somewhat frustrated. 12/22/17 on evaluation today patient appears to be doing a little worse compared to last evaluation. Unfortunately the wound is a little deeper and slightly larger than the last week's evaluation. With that being said he has made some progress in regard to the irritation surrounding at this time unfortunately despite that progress that's been made he still has a significant issue going on here. I'm not certain that he is having really any true infection at this time although with the Pyoderma gangrenosum it can [name], [name] ([id number]) sometimes be difficult to differentiate infection versus just inflammation. For that reason I discussed with him today the possibility of perform a wound culture to ensure there's nothing overtly infected. 01/06/18 on evaluation today patient's wound  is larger and deeper than previously evaluated. With that being said it did appear that his wound was infected after my last evaluation with him. Subsequently I did end up  prescribing a prescription for Bactrim DS which she has been taking and having no complication with. Fortunately there does not appear to be any evidence of infection at this point in time as far as anything spreading, no want to touch, and overall I feel like things are showing signs of improvement. 01/13/18 on evaluation today patient appears to be even a little larger and deeper than last time. There still muscle exposed in the base of the wound. Nonetheless he does appear to be less erythematous I do believe inflammation is calming down also believe the infection looks like it's probably resolved at this time based on what I'm seeing. No fevers, chills, nausea, or vomiting noted at this time. 01/30/18 on evaluation today patient actually appears to visually look better for the most part. Unfortunately those visually this looks better he does seem to potentially have what may be an abscess in the muscle that has been noted in the central portion of the wound. This is the first time that I have noted what appears to be fluctuance in the central portion of the muscle. With that being said I'm somewhat more concerned about the fact that this might indicate an abscess formation at this location. I do believe that an ultrasound would be appropriate. This is likely something we need to try to do as soon as possible. He has been switch to mupirocin ointment and he is no longer using the steroid ointment as prescribed by dermatology he sees them again next week he's been decreased from 60 to 40 mg of prednisone. 03/09/18 on evaluation today patient actually appears to be doing a little better compared to last time I saw him. There's not as much erythema surrounding the wound itself. He I did review his most recent infectious disease note  which was dated 02/24/18. He saw Dr. Michel Bickers in [place]. 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 Patient is now off of antibiotics and they are continually observing this. There seems to be no change in the past two weeks in my pinion based on what the patient says and what I see today compared to what Dr. Megan Salon likely saw two weeks ago. No fevers, chills, nausea, or vomiting noted at this time. 03/23/18 on evaluation today patient's wound actually appears to be showing signs of improvement which is good news. He is currently still on the Dapsone. He is also working on tapering the prednisone to get off of this and Dr. Lottie Rater is working with him in this regard. Nonetheless overall I feel like the wound is doing well it does appear based on the infectious disease note that I reviewed from Dr. 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 I did also review the results of his ultrasound of left lower extremity which revealed there was a dentist tissue in the base of the wound without an abscess noted. 04/06/18 on evaluation today the patient's left lateral lower extremity ulcer actually appears to be doing fairly well which is excellent news. There does not appear to be any evidence of infection at this time which is also great news. Overall he still does have a significantly large ulceration although little by little he seems to be making progress. He is down to 10 mg a day of the  prednisone. 04/20/18 on evaluation today patient actually appears to be doing excellent at this time in regard to his left lower extremity ulcer. He's making signs of good progress unfortunately this is taking much longer than we would really like to see but nonetheless he is making progress. Fortunately there  does not appear to be any evidence of infection at this time. No fevers, chills, nausea, or vomiting noted at this time. The patient has not been using the Santyl due to the cost he hadn't got in this field yet. He's mainly been using the antibiotic ointment topically. Subsequently he also tells me that he really has not been scrubbing in the shower I think this would be helpful again as I told him it doesn't have to be anything too aggressive to even make it believe just enough to keep it free of some of the loose slough and biofilm on the wound surface. 05/11/18 on evaluation today patient's wound appears to be making slow but sure progress in regard to the left lateral lower extremity ulcer. He is been tolerating the dressing changes without complication. Fortunately there does not appear to be any evidence of infection at this time. He is still just using triple antibiotic ointment along with clobetasol occasionally over the area. He never got the Santyl and really does not seem to intend to in my pinion. 06/01/18 on evaluation today patient appears to be doing a little better in regard to his left lateral lower extremity ulcer. He states that overall he does not feel like he is doing as well with the Dapsone as he did with the prednisone. Nonetheless he sees his dermatologist later today and is gonna talk to them about the possibility of going back on the prednisone. Overall again I believe that the wound would be better if you would utilize Santyl but he really does not seem to be interested in going back to the [employer] at this point. He has been using triple antibiotic ointment. [name], [name] ([id number]) 06/15/18 on evaluation today patient's wound actually appears to be doing about the same at this point. Fortunately there is no signs of infection at this time. He has made slight improvements although he continues to not really want to clean the wound bed at this point. He states that he just  doesn't mess with it he doesn't want to cause any problems with everything else he has going on. He has been on medication, antibiotics as prescribed by his dermatologist, for a staff infection of his lower extremities which is really drying out now and looking much better he tells me. Fortunately there is no sign of overall infection. 06/29/18 on evaluation today patient appears to be doing well in regard to his left lateral lower surety ulcer all things considering. Fortunately his staff infection seems to be greatly improved compared to previous. He has no signs of infection and this is drying up quite nicely. He is still the doxycycline for this is no longer on cental, Dapsone, or any of the other medications. His dermatologist has recommended possibility of an infusion but right now he does not want to proceed with that. 07/13/18 on evaluation today patient appears to be doing about the same in regard to his left lateral lower surety ulcer. Fortunately there's no signs of infection at this time which is great news. Unfortunately he still builds up a significant amount of Slough/biofilm of the surface of the wound he still is not really cleaning this as he should be  appropriately. Again I'm able to easily with saline and gauze remove the majority of this on the surface which if you would do this at home would likely be a dramatic improvement for him as far as getting the area to improve. Nonetheless overall I still feel like he is making progress is just very slow. I think Santyl will be of benefit for him as well. Still he has not gotten this as of this point. 07/27/18 on evaluation today patient actually appears to be doing little worse in regards of the erythema around the periwound region of the wound he also tells me that he's been having more drainage currently compared to what he was experiencing last time I saw him. He states not quite as bad as what he had because this was infected  previously but nonetheless is still appears to be doing poorly. Fortunately there is no evidence of systemic infection at this point. The patient tells me that he is not going to be able to afford the Santyl. He is still waiting to hear about the infusion therapy with his dermatologist. Apparently she wants an updated colonoscopy first. 08/10/18 on evaluation today patient appears to be doing better in regard to his left lateral lower extremity ulcer. Fortunately he is showing signs of improvement in this regard he's actually been approved for Remicade infusion's as well although this has not been scheduled as of yet. Fortunately there's no signs of active infection at this time in regard to the wound although he is having some issues with infection of the right lower extremity is been seen as dermatologist for this. Fortunately they are definitely still working with him trying to keep things under control. 09/07/18 on evaluation today patient is actually doing rather well in regard to his left lateral lower extremity ulcer. He notes these actually having some hair grow back on his extremity which is something he has not seen in years. He also tells me that the pain is really not giving them any trouble at this time which is also good news overall she is very pleased with the progress he's using a combination of the mupirocin along with the probate is all mixed. 09/21/18 on evaluation today patient actually appears to be doing fairly well all things considered in regard to his looks from the ulcer. He's been tolerating the dressing changes without complication. Fortunately there's no signs of active infection at this time which is good news he is still on all antibiotics or prevention of the staff infection. He has been on prednisone for time although he states it is gonna contact his dermatologist and see if she put them on a short course due to some irritation that he has going on currently.  Fortunately there's no evidence of any overall worsening this is going very slow I think cental would be something that would be helpful for him although he states that $50 for tube is quite expensive. He therefore is not willing to get that at this point. 10/06/18 on evaluation today patient actually appears to be doing decently well in regard to his left lateral leg ulcer. He's been tolerating the dressing changes without complication. Fortunately there's no signs of active infection at this time. Overall I'm actually rather pleased with the progress he's making although it's slow he doesn't show any signs of infection and he does seem to be making some improvement. I do believe that he may need a switch up and dressings to try to help this to  heal more appropriately and quickly. 10/19/18 on evaluation today patient actually appears to be doing better in regard to his left lateral lower extremity ulcer. This is shown signs of having much less Slough buildup at this point due to the fact he has been using the [employer]. Obviously this is very good news. The overall size of the wound is not dramatically smaller but again the appearance is. 11/02/18 on evaluation today patient actually appears to be doing quite well in regard to his lower [place] ulcer. A lot of the skin around the ulcer is actually somewhat irritating at this point this seems to be more due to the dressing causing irritation from the adhesive that anything else. Fortunately there is no signs of active infection at this time. 11/24/18 on evaluation today patient appears to be doing a little worse in regard to his overall appearance of his lower extremity [name], [name] ([id number]) ulcer. There's more erythema and warmth around the wound unfortunately. He is currently on doxycycline which he has been on for some time. With that being said I'm not sure that seems to be helping with what appears to possibly be an acute cellulitis with regard to  his left lower extremity ulcer. No fevers, chills, nausea, or vomiting noted at this time. 12/08/18 on evaluation today patient's wounds actually appears to be doing significantly better compared to his last evaluation. He has been using Santyl along with alternating tripling about appointment as well as the steroid cream seems to be doing quite well and the wound is showing signs of improvement which is excellent news. Fortunately there's no evidence of infection and in fact his culture came back negative with only normal skin flora noted. 12/21/2018 upon evaluation today patient actually appears to be doing excellent with regard to his ulcer. This is actually the best that I have seen it since have been helping to take care of him. It is both smaller as well as less slough noted on the surface of the wound and seems to be showing signs of good improvement with new skin growing from the edges. He has been using just the triamcinolone he does wonder if he can get a refill of that ointment today. 01/04/2019 upon evaluation today patient actually appears to be doing well with regard to his left lateral lower extremity ulcer. With that being said it does not appear to be that he is doing quite as well as last time as far as progression is concerned. There does not appear to be any signs of infection or significant irritation which is good news. With that being said I do believe that he may benefit from switching to a collagen based dressing based on how clean The wound appears. 01/18/2019 on evaluation today patient actually appears to be doing well with regard to his wound on the left lower extremity. He is not made a lot of progress compared to where we were previous but nonetheless does seem to be doing okay at this time which is good news. There is no signs of active infection which is also good news. My only concern currently is I do wish we can get him into utilizing the collagen dressing his insurance  would not pay for the supplies that we ordered although it appears that he may be able to order this through his supply company that he typically utilizes. This is [employer]. Nonetheless he did try to order it during the office visit today and it appears this did go through. We  will see if he can get that it is a different brand but nonetheless he has collagen and I do think will be beneficial. 02/01/2019 on evaluation today patient actually appears to be doing a little worse today in regard to the overall size of his wounds. Fortunately there is no signs of active infection at this time. That is visually. Nonetheless when this is happened before it was due to infection. For that reason were somewhat concerned about that this time as well. 02/08/2019 on evaluation today patient unfortunately appears to be doing slightly worse with regard to his wound upon evaluation today. Is measuring a little deeper and a little larger unfortunately. I am not really sure exactly what is causing this to enlarge he actually did see his dermatologist she is going to see about initiating Humira for him. Subsequently she also did do steroid injections into the wound itself in the periphery. Nonetheless still nonetheless he seems to be getting a little bit larger he is gone back to just using the steroid cream topically which I think is appropriate. I would say hold off on the collagen for the time being is definitely a good thing to do. Based on the culture results which we finally did get the final result back regarding it shows staph as the bacteria noted again that can be a normal skin bacteria based on the fact however he is having increased drainage and worsening of the wound measurement wise I would go ahead and place him on an antibiotic today I do believe for this. 02/15/2019 on evaluation today patient actually appears to be doing somewhat better in regard to his ulcer. There is no signs of worsening at this time  I did review his culture results which showed evidence of Staphylococcus aureus but not MRSA. Again this could just be more related to the normal skin bacteria although he states the drainage has slowed down quite a bit he may have had a mild infection not just colonization. And was much smaller and then since around10/04/2019 on evaluation today patient appears to be doing unfortunately worse as far as the size of the wound. I really feel like that this is steadily getting larger again it had been doing excellent right at the beginning of September we have seen a steady increase in the area of the wound it is almost 2-1/2 times the size it was on September 1. Obviously this is a bad trend this is not wanting to see. For that reason we went back to using just the topical triamcinolone cream which does seem to help with inflammation. I checked him for bacteria by way of culture and nothing showed positive there. I am considering giving him a short course of a tapering steroid Dosepak today to see if that is can be beneficial for him. The patient is in agreement with giving that a try. 03/08/2019 on evaluation today patient appears to be doing very well in comparison to last evaluation with regard to his lower extremity ulcer. This is showing signs of less inflammation and actually measuring slightly smaller compared to last time every other week over the past month and a half he has been measuring larger larger larger. Nonetheless I do believe that the issue has been inflammation the prednisone does seem to have been beneficial for him which is good news. No fevers, chills, nausea, vomiting, or diarrhea. [name], [name] ([id number]) 03/22/2019 on evaluation today patient appears to be doing about the same with regard to his leg  ulcer. He has been tolerating the dressing changes without complication. With that being said the wound seems to be mostly arrested at its current size but really is not making  any progress except for when we prescribed the prednisone. He did show some signs of dropping as far as the overall size of the wound during that interval week. Nonetheless this is something he is not on long-term at this point and unfortunately I think he is getting need either this or else the Humira which his dermatologist has discussed try to get approval for. With that being said he will be seeing his dermatologist on the 11th of this month that is November. 04/19/2019 on evaluation today patient appears to be doing really about the same the wound is measuring slightly larger compared to last time I saw him. He has not been into the office since November 2 due to the fact that he unfortunately had Covid as that his entire family. He tells me that it was rough but they did pull-through and he seems to be doing much better. Fortunately there is no signs of active infection at this time. No fevers, chills, nausea, vomiting, or diarrhea. 05/10/2019 on evaluation today patient unfortunately appears to be doing significantly worse as compared to last time I saw him. He does tell me that he has had his first dose of Humira and actually is scheduled to get the next one in the upcoming week. With that being said he tells me also that in the past several days he has been having a lot of issues with green drainage she showed me a picture this is more blue-green in color. He is also been having issues with increased sloughy buildup and the wound does appear to be larger today. Obviously this is not the direction that we want everything to take based on the starting of his Humira. Nonetheless I think this is definitely a result of likely infection and to be honest I think this is probably Pseudomonas causing the infection based on what I am seeing. 05/24/2019 on evaluation today patient unfortunately appears to be doing significantly worse compared to his prior evaluation with me 2 weeks ago. I did review his  culture results which showed that he does have Staph aureus as well as Pseudomonas noted on the culture. Nonetheless the Levaquin that I prescribed for him does not appear to have been appropriate and in fact he tells me he is no longer experiencing the green drainage and discharge that he had at the last visit. Fortunately there is no signs of active infection at this time which is good news although the wound has significantly worsened it in fact is much deeper than it was previous. We have been utilizing up to this point triamcinolone ointment as the prescription topical of choice but at this time I really feel like that the wound is getting need to be packed in order to appropriately manage this due to the deeper nature of the wound. Therefore something along the lines of an alginate dressing may be more appropriate. 05/31/2019 upon inspection today patient's wound actually showed signs of doing poorly at this point. Unfortunately he just does not seem to be making any good progress despite what we have tried. He actually did go ahead and pick up the Cipro and start taking that as he was noticing more green drainage he had previously completed the Levaquin that I prescribed for him as well. Nonetheless he missed his appointment for the seventh  last week on Wednesday with the wound care center and [facility] where his dermatologist referred him. Obviously I do think a second opinion would be helpful at this point especially in light of the fact that the patient seems to be doing so poorly despite the fact that we have tried everything that I really know how at this point. The only thing that ever seems to have helped him in the past is when he was on high doses of continual steroids that did seem to make a difference for him. Right now he is on immune modulating medication to try to help with the pyoderma but I am not sure that he is getting as much relief at this point as he is previously  obtained from the use of steroids. Objective Constitutional Well-nourished and well-hydrated in no acute distress. Vitals Time Taken: 8:03 AM, Height: 71 in, Weight: 338 lbs, BMI: 47.1, Temperature: 98.6 F, Pulse: 93 bpm, Respiratory Rate: 16 breaths/min, Blood Pressure: 162/95 mmHg. Respiratory normal breathing without difficulty. DARRIE, MACMILLAN ([id number]) Psychiatric this patient is able to make decisions and demonstrates good insight into disease process. Alert and Oriented x 3. pleasant and cooperative. General Notes: Patient's wound upon inspection appears to be doing significantly worse. I did recommend that he go ahead and take the Cipro which I prescribed to him. I do feel like there may be potentially some infection component although to be perfectly honest I am not really sure that that is the main issue here nonetheless I do not think it will hurt him to take the Cipro currently. He is already started this anyway and feels like it may have given some marginal relief. He had a lot of pain last week prior to starting the Cipro. Integumentary (Hair, Skin) Wound #1 status is Open. Original cause of wound was Gradually Appeared. The wound is located on the Left,Lateral Lower Leg. The wound measures 5.4cm length x 5.2cm width x 0.7cm depth; 22.054cm^2 area and 15.438cm^3 volume. There is muscle and Fat Layer (Subcutaneous Tissue) Exposed exposed. There is no tunneling or undermining noted. There is a medium amount of serous drainage noted. The wound margin is distinct with the outline attached to the wound base. There is small (1-33%) pink granulation within the wound bed. There is a large (67-100%) amount of necrotic tissue within the wound bed including Adherent Slough. 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 Anesthetic (add to Medication List): Wound #1 Left,Lateral Lower Leg: Topical Lidocaine 4% cream applied to wound bed prior to debridement (In Clinic Only). Skin Barriers/Peri-Wound Care: Wound #1 Left,Lateral Lower Leg: Triamcinolone Acetonide Ointment (TCA) Primary Wound Dressing: Dry Gauze Secondary Dressing: Wound #1 Left,Lateral Lower Leg: Boardered Foam Dressing Dressing Change Frequency: Wound #1 Left,Lateral Lower Leg: Change dressing every other day. Follow-up Appointments: [name], [name] ([id number]) Wound #1 Left,Lateral Lower Leg: Return Appointment in 1 week. Edema Control: Wound #1 Left,Lateral Lower Leg: Elevate legs to the level of the heart and pump ankles as often as possible 1. At this time my suggestion is good to be that we go ahead and continue with the triamcinolone cream in a thin film to the wound bed followed by dry dressing covered with a border foam dressing. Again I do think the alginate would be better although he tells me that that seems  to make things hurt worse even when he uses just the plain alginate. 2. I am also going to recommend that he really needs to see the wound center at [facility] for the second opinion as soon as possible obviously I think that we have tried pretty much everything that I have come up with and unfortunately we seem to be going backwards not forwards with this wound. His dermatologist has noted this as well and wanted him to see the provider at [facility] which I am definitely okay with. With that being said again the only thing that I have noticed really helps him in the past is what is going on prednisone for extended periods of time his wound has done much better. When he is come off of it things have always relapsed right now his dermatologist does not have him on the prednisone he is on Humira. We will see patient back for reevaluation in 1 week here in the  clinic. If anything worsens or changes patient will contact our office for additional recommendations. Electronic Signature(s) Signed: 05/31/2019 11:00:34 AM By: Worthy Keeler PA-C Entered By: Worthy Keeler on 05/31/2019 11:00:34 [name] ([id number]) -------------------------------------------------------------------------------- SuperBill Details Patient Name: [name] Date of Service: 05/31/2019 Medical Record Number: [id number] Patient Account Number: [id number] Date of Birth/Sex: 22-May-1980 (39 y.o. M) Treating RN: [name] Primary Care Provider: [name] Other Clinician: Referring Provider: [name] Treating Provider/Extender: Melburn Hake, Nachman Sundt Weeks in Treatment: 129 Diagnosis Coding ICD-10 Codes Code Description [phone] Non-pressure chronic ulcer of left calf with fat layer exposed L88 Pyoderma gangrenosum I87.2 Venous insufficiency (chronic) (peripheral) L03.116 Cellulitis of left lower limb Physician Procedures CPT4 Code: [id number] Description: 09628 - WC PHYS LEVEL 3 - EST PT ICD-10 Diagnosis Description L97.222 Non-pressure chronic ulcer of left calf with fat layer expo L88 Pyoderma gangrenosum I87.2 Venous insufficiency (chronic) (peripheral) L03.116 Cellulitis of left lower  limb Modifier: sed Quantity: 1 Electronic Signature(s) Signed: 05/31/2019 11:00:51 AM By: Worthy Keeler PA-C Entered By: Worthy Keeler on 05/31/2019 11:00:50

## 2019-05-31 NOTE — Progress Notes (Signed)
VIRLAN, KEMPKER ([id number]) Visit Report for 05/31/2019 Arrival Information Details Patient Name: [name], [name] Date of Service: 05/31/2019 8:00 AM Medical Record Number: [id number] Patient Account Number: [id number] Date of Birth/Sex: 12/03/1980 (39 y.o. M) Treating RN: Army Melia Primary Care Leveda Kendrix: [name] Other Clinician: Referring Gentry Seeber: [name] Treating Tilda Samudio/Extender: Melburn Hake, [name]: 64 Visit Information History Since Last Visit Added or deleted any medications: No Patient Arrived: Ambulatory Any new allergies or adverse reactions: No Arrival Time: 08:03 Had a fall or experienced change in No Accompanied By: self activities of daily living that may affect Transfer Assistance: None risk of falls: Patient Identification Verified: Yes Signs or symptoms of abuse/neglect since last visito No Patient Requires Transmission-Based No Hospitalized since last visit: No Precautions: Has Dressing in Place as Prescribed: Yes Patient Has Alerts: No Pain Present Now: Yes Electronic Signature(s) Signed: 05/31/2019 3:07:10 PM By: Army Melia Entered By: Army Melia on 05/31/2019 08:03:21 [name] ([id number]) -------------------------------------------------------------------------------- Clinic Level of Care Assessment Details Patient Name: [name] Date of Service: 05/31/2019 8:00 AM Medical Record Number: [id number] Patient Account Number: [id number] Date of Birth/Sex: 12/16/1980 (39 y.o. M) Treating RN: [name] Primary Care Jovaughn Wojtaszek: [name] Other Clinician: Referring Hang Ammon: [name] Treating Colletta Spillers/Extender: Melburn Hake, [name]: 129 Clinic Level of Care Assessment Items TOOL 4 Quantity Score []  - Use when only an EandM is performed on FOLLOW-UP visit 0 ASSESSMENTS - Nursing Assessment / Reassessment []  - Reassessment of Co-morbidities (includes updates in patient status) 0 X- 1  5 Reassessment of Adherence to Treatment Plan ASSESSMENTS - Wound and Skin Assessment / Reassessment X - Simple Wound Assessment / Reassessment - one wound 1 5 []  - 0 Complex Wound Assessment / Reassessment - multiple wounds []  - 0 Dermatologic / Skin Assessment (not related to wound area) ASSESSMENTS - Focused Assessment []  - Circumferential Edema Measurements - multi extremities 0 []  - 0 Nutritional Assessment / Counseling / Intervention []  - 0 Lower Extremity Assessment (monofilament, tuning fork, pulses) []  - 0 Peripheral Arterial Disease Assessment (using hand held doppler) ASSESSMENTS - Ostomy and/or Continence Assessment and Care []  - Incontinence Assessment and Management 0 []  - 0 Ostomy Care Assessment and Management (repouching, etc.) PROCESS - Coordination of Care X - Simple Patient / Family Education for ongoing care 1 15 []  - 0 Complex (extensive) Patient / Family Education for ongoing care []  - 0 Staff obtains Programmer, systems, Records, Test Results / Process Orders []  - 0 Staff telephones HHA, Nursing Homes / Clarify orders / etc []  - 0 Routine Transfer to another Facility (non-emergent condition) []  - 0 Routine Hospital Admission (non-emergent condition) []  - 0 New Admissions / Biomedical engineer / Ordering NPWT, Apligraf, etc. []  - 0 Emergency Hospital Admission (emergent condition) X- 1 10 Simple Discharge Coordination [name], [name] ([id number]) []  - 0 Complex (extensive) Discharge Coordination PROCESS - Special Needs []  - Pediatric / Minor Patient Management 0 []  - 0 Isolation Patient Management []  - 0 Hearing / Language / Visual special needs []  - 0 Assessment of Community assistance (transportation, D/C planning, etc.) []  - 0 Additional assistance / Altered mentation []  - 0 Support Surface(s) Assessment (bed, cushion, seat, etc.) INTERVENTIONS - Wound Cleansing / Measurement X - Simple Wound Cleansing - one wound 1 5 []  - 0 Complex Wound  Cleansing - multiple wounds X- 1 5 Wound Imaging (photographs - any number of wounds) []  - 0 Wound Tracing (instead of photographs) X- 1 5 Simple Wound Measurement -  one wound []  - 0 Complex Wound Measurement - multiple wounds INTERVENTIONS - Wound Dressings []  - Small Wound Dressing one or multiple wounds 0 X- 1 15 Medium Wound Dressing one or multiple wounds []  - 0 Large Wound Dressing one or multiple wounds []  - 0 Application of Medications - topical []  - 0 Application of Medications - injection INTERVENTIONS - Miscellaneous []  - External ear exam 0 []  - 0 Specimen Collection (cultures, biopsies, blood, body fluids, etc.) []  - 0 Specimen(s) / Culture(s) sent or taken to Lab for analysis []  - 0 Patient Transfer (multiple staff / Civil Service fast streamer / Similar devices) []  - 0 Simple Staple / Suture removal (25 or less) []  - 0 Complex Staple / Suture removal (26 or more) []  - 0 Hypo / Hyperglycemic Management (close monitor of Blood Glucose) []  - 0 Ankle / Brachial Index (ABI) - do not check if billed separately X- 1 5 Vital Signs [name], [name] ([id number]) Has the patient been seen at the hospital within the last three years: Yes Total Score: 70 Level Of Care: New/Established - Level 2 Electronic Signature(s) Signed: 05/31/2019 5:00:56 PM By: [name], [name], RN, [name], [name], [name] Entered By: [name], [name], RN, [name], [name] on 05/31/2019 16:35:59 [name] ([id number]) -------------------------------------------------------------------------------- Encounter Discharge Information Details Patient Name: [name] Date of Service: 05/31/2019 8:00 AM Medical Record Number: [id number] Patient Account Number: [id number] Date of Birth/Sex: 1980-11-29 (39 y.o. M) Treating RN: [name] Primary Care Markie Heffernan: [name] Other Clinician: Referring Remus Hagedorn: [name] Treating Manraj Yeo/Extender: Melburn Hake, [name]: 129 Encounter Discharge Information  Items Discharge Condition: Stable Ambulatory Status: Ambulatory Discharge Destination: Home Transportation: Private Auto Accompanied By: self Schedule Follow-up Appointment: Yes Clinical Summary of Care: Electronic Signature(s) Signed: 05/31/2019 5:00:56 PM By: [name], [name], RN, [name], [name], [name] Entered By: [name], [name], RN, [name], [name] on 05/31/2019 08:36:58 [name] ([id number]) -------------------------------------------------------------------------------- Lower Extremity Assessment Details Patient Name: [name] Date of Service: 05/31/2019 8:00 AM Medical Record Number: [id number] Patient Account Number: [id number] Date of Birth/Sex: 1980/11/10 (39 y.o. M) Treating RN: Army Melia Primary Care Carmeron Heady: [name] Other Clinician: Referring Chandlar Staebell: [name] Treating Nastashia Gallo/Extender: STONE III, [name]: 129 Edema Assessment Assessed: [Left: No] [Right: No] Edema: [Left: N] [Right: o] Vascular Assessment Pulses: Dorsalis Pedis Palpable: [Left:Yes] Electronic Signature(s) Signed: 05/31/2019 3:07:10 PM By: Army Melia Entered By: Army Melia on 05/31/2019 08:09:26 [name] ([id number]) -------------------------------------------------------------------------------- Multi Wound Chart Details Patient Name: [name] Date of Service: 05/31/2019 8:00 AM Medical Record Number: [id number] Patient Account Number: [id number] Date of Birth/Sex: [birth year] (39 y.o. M) Treating RN: [name] Primary Care Niomie Englert: [name] Other Clinician: Referring Briggs Edelen: [name] Treating Jessa Stinson/Extender: Melburn Hake, [name]: 129 Vital Signs Height(in): 71 Pulse(bpm): 93 Weight(lbs): 338 Blood Pressure(mmHg): 162/95 Body Mass Index(BMI): 47 Temperature(F): 98.6 Respiratory Rate 16 (breaths/min): Photos: [N/A:N/A] Wound Location: Left Lower Leg - Lateral N/A N/A Wounding Event: Gradually Appeared N/A  N/A Primary Etiology: Pyoderma N/A N/A Comorbid History: Sleep Apnea, Hypertension, N/A N/A Colitis Date Acquired: 11/18/2015 N/A N/A Weeks of Treatment: 129 N/A N/A Wound Status: Open N/A N/A Measurements L x W x D 5.4x5.2x0.7 N/A N/A (cm) Area (cm) : 22.054 N/A N/A Volume (cm) : 15.438 N/A N/A % Reduction in Area: -349.30% N/A N/A % Reduction in Volume: -293.10% N/A N/A Classification: Full Thickness With Exposed N/A N/A Support Structures Exudate Amount: Medium N/A N/A Exudate Type: Serous N/A N/A Exudate Color: amber N/A N/A Wound Margin:  Distinct, outline attached N/A N/A Granulation Amount: Small (1-33%) N/A N/A Granulation Quality: Pink N/A N/A Necrotic Amount: Large (67-100%) N/A N/A Exposed Structures: Fat Layer (Subcutaneous N/A N/A Tissue) Exposed: Yes Muscle: Yes Fascia: No Tendon: No [name], [name] ([id number]) Joint: No Bone: No Epithelialization: None N/A N/A Treatment Notes Electronic Signature(s) Signed: 05/31/2019 5:00:56 PM By: [name], [name], RN, [name], [name], [name] Entered By: [name], [name], RN, [name], [name] on 05/31/2019 08:27:53 [name] ([id number]) -------------------------------------------------------------------------------- Multi-Disciplinary Care Plan Details Patient Name: [name] Date of Service: 05/31/2019 8:00 AM Medical Record Number: [id number] Patient Account Number: [id number] Date of Birth/Sex: 1980/07/27 (39 y.o. M) Treating RN: [name] Primary Care Sherion Dooly: [name] Other Clinician: Referring Desmen Schoffstall: [name] Treating Eero Dini/Extender: Melburn Hake, [name]: 129 Active Inactive Orientation to the Wound Care Program Nursing Diagnoses: Knowledge deficit related to the wound healing center program Goals: Patient/caregiver will verbalize understanding of the [facility] Program Date Initiated: 12/11/2016 Target Resolution Date: 03/13/2017 Goal Status: Active Interventions: Provide education  on orientation to the wound center Notes: Venous Leg Ulcer Nursing Diagnoses: Knowledge deficit related to disease process and management Potential for venous Insuffiency (use before diagnosis confirmed) Goals: Non-invasive venous studies are completed as ordered Date Initiated: 12/11/2016 Target Resolution Date: 03/13/2017 Goal Status: Active Patient will maintain optimal edema control Date Initiated: 12/11/2016 Target Resolution Date: 03/13/2017 Goal Status: Active Patient/caregiver will verbalize understanding of disease process and disease management Date Initiated: 12/11/2016 Target Resolution Date: 03/13/2017 Goal Status: Active Verify adequate tissue perfusion prior to therapeutic compression application Date Initiated: 12/11/2016 Target Resolution Date: 03/13/2017 Goal Status: Active Interventions: Assess peripheral edema status every visit. Compression as ordered Provide education on venous insufficiency Treatment Activities: [name], [name] ([id number]) Therapeutic compression applied : 12/11/2016 Notes: Wound/Skin Impairment Nursing Diagnoses: Impaired tissue integrity Knowledge deficit related to ulceration/compromised skin integrity Goals: Patient/caregiver will verbalize understanding of skin care regimen Date Initiated: 12/11/2016 Target Resolution Date: 03/13/2017 Goal Status: Active Ulcer/skin breakdown will have a volume reduction of 30% by week 4 Date Initiated: 12/11/2016 Target Resolution Date: 03/13/2017 Goal Status: Active Ulcer/skin breakdown will have a volume reduction of 50% by week 8 Date Initiated: 12/11/2016 Target Resolution Date: 03/13/2017 Goal Status: Active Ulcer/skin breakdown will have a volume reduction of 80% by week 12 Date Initiated: 12/11/2016 Target Resolution Date: 03/13/2017 Goal Status: Active Ulcer/skin breakdown will heal within 14 weeks Date Initiated: 12/11/2016 Target Resolution Date: 03/13/2017 Goal Status:  Active Interventions: Assess patient/caregiver ability to obtain necessary supplies Assess patient/caregiver ability to perform ulcer/skin care regimen upon admission and as needed Assess ulceration(s) every visit Provide education on ulcer and skin care Treatment Activities: Skin care regimen initiated : 12/11/2016 Topical wound management initiated : 12/11/2016 Notes: Electronic Signature(s) Signed: 05/31/2019 5:00:56 PM By: [name], [name], RN, [name], [name], [name] Entered By: [name], [name], RN, [name], [name] on 05/31/2019 08:27:45 [name] ([id number]) -------------------------------------------------------------------------------- Pain Assessment Details Patient Name: [name] Date of Service: 05/31/2019 8:00 AM Medical Record Number: [id number] Patient Account Number: [id number] Date of Birth/Sex: [birth year] (39 y.o. M) Treating RN: Army Melia Primary Care Tinaya Ceballos: [name] Other Clinician: Referring Deah Ottaway: [name] Treating Alexsa [name]/Extender: Melburn Hake, [name]: 129 Active Problems Location of Pain Severity and Description of Pain Patient Has Paino Yes Site Locations Pain Location: Pain in Ulcers Rate the pain. Current Pain Level: 7 Pain Management and Medication Current Pain Management: Electronic Signature(s) Signed: 05/31/2019 3:07:10 PM By: Army Melia Entered By: Army Melia on 05/31/2019 08:03:33 [name], [name] (  [id number]) -------------------------------------------------------------------------------- Patient/Caregiver Education Details Patient Name: [name] Date of Service: 05/31/2019 8:00 AM Medical Record Number: [id number] Patient Account Number: [id number] Date of Birth/Gender: 1980-08-22 (39 y.o. M) Treating RN: [name] Primary Care Physician: [name] Other Clinician: Referring Physician: [name] Treating Physician/Extender: Sharalyn Ink in Treatment: 129 Education Assessment Education  Provided To: Patient Education Topics Provided Wound/Skin Impairment: Handouts: Caring for Your Ulcer Methods: Demonstration, Explain/Verbal Responses: State content correctly Electronic Signature(s) Signed: 05/31/2019 5:00:56 PM By: [name], [name], RN, [name], [name], [name] Entered By: [name], [name], RN, [name], [name] on 05/31/2019 08:36:20 [name] ([id number]) -------------------------------------------------------------------------------- Wound Assessment Details Patient Name: [name] Date of Service: 05/31/2019 8:00 AM Medical Record Number: [id number] Patient Account Number: [id number] Date of Birth/Sex: 09-Jul-1980 (39 y.o. M) Treating RN: Army Melia Primary Care Lenzie Montesano: [name] Other Clinician: Referring Jacoria Keiffer: [name] Treating Shevon Sian/Extender: Melburn Hake, [name]: 129 Wound Status Wound Number: 1 Primary Etiology: Pyoderma Wound Location: Left Lower Leg - Lateral Wound Status: Open Wounding Event: Gradually Appeared Comorbid History: Sleep Apnea, Hypertension, Colitis Date Acquired: 11/18/2015 Weeks Of Treatment: 129 Clustered Wound: No Photos Wound Measurements Length: (cm) 5.4 Width: (cm) 5.2 Depth: (cm) 0.7 Area: (cm) 22.054 Volume: (cm) 15.438 % Reduction in Area: -349.3% % Reduction in Volume: -293.1% Epithelialization: None Tunneling: No Undermining: No Wound Description Full Thickness With Exposed Support Foul Odor A Classification: Structures Slough/Fibr Wound Margin: Distinct, outline attached Exudate Medium Amount: Exudate Type: Serous Exudate Color: amber fter Cleansing: No ino Yes Wound Bed Granulation Amount: Small (1-33%) Exposed Structure Granulation Quality: Pink Fascia Exposed: No Necrotic Amount: Large (67-100%) Fat Layer (Subcutaneous Tissue) Exposed: Yes Necrotic Quality: Adherent Slough Tendon Exposed: No Muscle Exposed: Yes Necrosis of Muscle: No Joint Exposed: No Bone Exposed: No [name],  [name] ([id number]) Treatment Notes Wound #1 (Left, Lateral Lower Leg) Notes TCA, dry gauze, BFD Electronic Signature(s) Signed: 05/31/2019 3:07:10 PM By: Army Melia Entered By: Army Melia on 05/31/2019 08:07:48 [name] ([id number]) -------------------------------------------------------------------------------- Vitals Details Patient Name: [name] Date of Service: 05/31/2019 8:00 AM Medical Record Number: [id number] Patient Account Number: [id number] Date of Birth/Sex: 11-05-1980 (39 y.o. M) Treating RN: Army Melia Primary Care Harmoney Sienkiewicz: [name] Other Clinician: Referring Oneka Parada: [name] Treating Matisse Roskelley/Extender: Melburn Hake, [name]: 129 Vital Signs Time Taken: 08:03 Temperature (F): 98.6 Height (in): 71 Pulse (bpm): 93 Weight (lbs): 338 Respiratory Rate (breaths/min): 16 Body Mass Index (BMI): 47.1 Blood Pressure (mmHg): 162/95 Reference Range: 80 - 120 mg / dl Electronic Signature(s) Signed: 05/31/2019 3:07:10 PM By: Army Melia Entered By: Army Melia on 05/31/2019 08:03:52

## 2019-06-07 ENCOUNTER — Other Ambulatory Visit: Payer: Self-pay

## 2019-06-07 ENCOUNTER — Encounter: Payer: 59 | Admitting: Physician Assistant

## 2019-06-07 DIAGNOSIS — L97222 Non-pressure chronic ulcer of left calf with fat layer exposed: Secondary | ICD-10-CM | POA: Diagnosis not present

## 2019-06-07 NOTE — Progress Notes (Addendum)
[name], [name] ([id number]) Visit Report for 06/07/2019 Chief Complaint Document Details Patient Name: [name] Date of Service: 06/07/2019 8:00 AM Medical Record Number: [id number] Patient Account Number: [id number] Date of Birth/Sex: [birth year] (39 y.o. 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 M) Treating RN: [name] Primary Care Provider: Alma [name] Other Clinician: Referring Provider: Alma [name] Treating Provider/Extender: [name], [name] [name] [name] in Treatment: 130 History of Present Illness HPI Description: 12/04/16; 39 year old man who comes into the clinic today for review of a wound on the posterior left calf. He tells me that is been there for about a year. He is not a diabetic he does smoke half a pack per day. He was seen in the ER on 11/20/16 felt to have cellulitis around the wound and was given clindamycin. An x-ray did not show osteomyelitis. The patient initially tells me that he has a milk allergy that sets off a pruritic itching rash on his lower legs which she scratches incessantly and he thinks that's what may have set up the wound. He has been using various topical antibiotics and ointments without any effect. He works in a [employer] and is on his feet all day. He does not have a prior  history of wounds however he does have the rash on both lower legs the right arm and the ventral aspect of his left arm. These are excoriations and clearly have had scratching however there are of macular looking areas on both legs including a substantial larger area on the right leg. This does not have an underlying open area. There is no blistering. The patient tells me that 2 years ago in [place] in response to the rash on his legs he saw a dermatologist who told him he had a condition which may be pyoderma gangrenosum although I may be putting words into his mouth. He seemed to recognize this. 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 He is not in any treatment for this. He's had no recent travel 12/11/16; the patient arrives today with his wound and roughly the same condition we've been using silver alginate this is a deep punched out wound with some surrounding erythema but no tenderness. Biopsy I did did not show confirmed pyoderma gangrenosum suggested nonspecific inflammation and vasculitis but does not provide an actual description of what was seen by the pathologist. I'm really not able to understand this We have also received information from the patient's dermatologist in [place] notes from April 2016. This was a doctor Agarwal- antal. 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 He was prescribed topical steroid high potency under occlusion which helped but at this point the patient did not have a deep punched out wound. 12/18/16; the patient's wound is larger in terms of surface area however this surface looks better and there is less depth. The surrounding erythema also is better. The patient states that the wrap we put on came off 2 days ago when he has been using  his compression stockings. He we are in the process of getting a dermatology consult. 12/26/16 on evaluation today patient's left lower extremity wound shows evidence of  infection with surrounding erythema noted. He has been tolerating the dressing changes but states that he has noted more discomfort. There is a larger area of erythema surrounding the wound. No fevers, chills, nausea, or vomiting noted at this time. With that being said the wound still does have slough covering the surface. He is not allergic to any medication that he is aware of at this point. In regard to his right lower extremity he had several regions that are erythematous and pruritic he wonders if there's anything we can do to help that. 01/02/17 I reviewed patient's wound culture which was obtained his visit last week. He was placed on doxycycline at that point. Unfortunately that does not appear to be an antibiotic that would likely help with the situation however the pseudomonas noted on culture is sensitive to Cipro. Also unfortunately patient's wound seems to have a large compared to last week's evaluation. Not severely so but there are definitely increased measurements in general. He is continuing to have discomfort as well he writes this to be a seven out of 10. In fact he would prefer me not to perform any debridement today due to the fact that he is having discomfort and considering he has an active infection on the little reluctant to do so anyway. No fevers, chills, nausea, or vomiting noted at this time. 01/08/17; patient seems dermatology on September 5. I suspect dermatology will want the slides from the biopsy I did sent to their pathologist. I'm not sure if there is a way we can expedite that. In any case the culture I did before I left on vacation 3 [name] ago showed Pseudomonas he was given 10 days of Cipro and per her description of her intake nurses is actually somewhat better this week although the wound is quite a bit bigger than I remember the last time I saw this. He still has 3 more days of Cipro [name] ([id number]) 01/21/17; dermatology appointment tomorrow. He has  completed the ciprofloxacin for Pseudomonas. Surface of the wound looks better however he is had some deterioration in the lesions on his right leg. Meantime the left lateral leg wound we will continue with sample 01/29/17; patient had his dermatology appointment but I can't yet see that note. He is completed his antibiotics. The wound is more superficial but considerably larger in circumferential area than when he came in. This is in his left lateral calf. He also has swollen erythematous areas with superficial wounds on the right leg and small papular areas on both arms. There apparently areas in her his upper thighs and buttocks I did not look at those. Dermatology biopsied the right leg. Hopefully will have their input next week. 02/05/17; patient went back to see his dermatologist who told him that he had a "scratching problem" as well as staph. He is now on a 30 day course of doxycycline and I believe she gave him triamcinolone cream to the right leg areas to help with the itching [not exactly sure but probably triamcinolone]. She apparently looked at the left lateral leg wound although this was not rebiopsied and I think felt to be ultimately part of the same pathogenesis. He is using sample border foam and changing nevus himself. He now has a new open area on the right posterior leg which was his biopsy site I don't  have any of the dermatology notes 02/12/17; we put the patient in compression last week with SANTYL to the wound on the left leg and the biopsy. Edema is much better and the depth of the wound is now at level of skin. Area is still the same oBiopsy site on the right lateral leg we've also been using santyl with a border foam dressing and he is changing this himself. 02/19/17; Using silver alginate started last week to both the substantial left leg wound and the biopsy site on the right wound. He is tolerating compression well. Has a an appointment with his primary M.D. tomorrow  wondering about diuretics although I'm wondering if the edema problem is actually lymphedema 02/26/17; the patient has been to see his primary doctor Dr. Jerrel Ivory at [place] our primary care. She started him on Lasix 20 mg and this seems to have helped with the edema. However we are not making substantial change with the left lateral calf wound and inflammation. The biopsy site on the right leg also looks stable but not really all that different. 03/12/17; the patient has been to see vein and vascular Dr. Lucky Cowboy. He has had venous reflux studies I have not reviewed these. I did get a call from his dermatology office. 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 They therefore recommended clobetasol and tetracycline i.e. 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 03/26/17; apparently the patient just had reflux studies not an appointment with Dr. dew. She arrives in clinic today having applied clobetasol for 2-3 [name]. He notes over the last 2-3 days excessive drainage having to change the dressing 3-4 times a day and also expanding erythema. 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 Lillia Carmel at [facility] dermatology locally who kindly saw him at my request. 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 My concern was the degree of erythema medially extending from his left leg wound which was either inflammation from  pyoderma or cellulitis. I put him on Augmentin however culture of the wound showed Pseudomonas which is quinolone sensitive. I really don't believe he has cellulitis however in view of everything I will continue and give him a course of Cipro. He is also on doxycycline as an immune modulator for the pyoderma. In addition to his original wound on the left lateral leg with surrounding erythema he has a wound on the right posterior calf which was an original biopsy site done by dermatology. 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 Saw Dr. Lillia Carmel yesterday. He has been using topical antibiotics to both wound areas his original wound on the left and the biopsies/pathergy area on the right. There is definitely some improvement in the inflammation around the wound on the right although the patient states he has increasing sensitivity of the wounds. He is on prednisone 60 and doxycycline 1 as prescribed by Dr. Lillia Carmel. He is covering the topical  antibiotic with gauze and putting this in his own compression stocks and changing this daily. He states that Dr. Lottie Rater did a culture of the left leg wound yesterday 05/07/17; pyoderma gangrenosum. The patient saw Dr. Lillia Carmel yesterday and has a follow-up with her in one month. He is still using topical antibiotics to both wounds although he can't recall exactly what type. He is still on prednisone 60 mg. Dr. Lillia Carmel stated that the doxycycline could stop if we were in agreement. 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 The right medial leg was induced by biopsy/pathergy. The area on the right is essentially healed. 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 The right medial leg has closed and  remains closed. He is still on prednisone 60. oHe tells me he missed his last dermatology appointment with Dr. Lillia Carmel but will make another appointment. He reports that her blood sugar at a recent screen in [place] was high 200's. He was 180 today. He is more cushingoid blood pressure is Carreras, Remmy ([id number]) up a bit. I think he is going to require still much longer prednisone perhaps another 3 months before attempting to taper. In the meantime his wound is a lot better. Smaller. He is cleaning this off daily and applying topical antibiotics. When he was last in the clinic I thought about changing to [facility] and actually put in a couple of calls to dermatology although probably not during their business hours. 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 He continues on oral prednisone. He has been using triple antibiotic ointment. There is surface debris and we will transition to [facility] and have him return in 2 [name]. He has lost 30 pounds since his last appointment with lifestyle modification. He may benefit from topical steroid cream for treatment this can be considered at a later date. 08/22/17 on evaluation today patient appears to actually be doing rather well in regard to his left lateral lower extremity ulcer. He has actually been managed by Dr. Dellia Nims most recently. Patient is currently on oral steroids at this time. This seems to have been of benefit for him. Nonetheless his last visit was actually with Leah on 08/06/17. Currently he is not utilizing any topical steroid creams although this could be of benefit as well. No fevers, chills, nausea, or vomiting noted at this time. 09/05/17 on evaluation today patient appears to be doing better in regard to his left lateral lower extremity ulcer. He has been tolerating the dressing changes without complication.  He is using Santyl with good effect. Overall I'm very pleased with how things are standing at this point. Patient likewise is happy that this is doing better. 09/19/17 on evaluation today patient actually appears to be doing rather well in regard to his left lateral lower extremity ulcer. 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 He's not having any significant pain. 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 He has been tolerating the Santyl without complication and in general I feel like he's making good  progress. 10/17/17 on evaluation today patient appears to be doing very well in regard to his left lateral lower surety ulcer. He has been tolerating the dressing changes without complication. There does not appear to be any evidence of infection he's alternating the Santyl and the triple antibiotic ointment every other day this seems to be doing well for him. 11/03/17 on evaluation today patient appears to be doing very well in regard to his left lateral lower extremity ulcer. He is been tolerating the dressing changes without complication which is good news. Fortunately there does not appear to be any evidence of infection which is also great news. Overall is doing excellent they are starting to taper down on the prednisone is down to 40 mg at this point it also started topical clobetasol for him. 11/17/17 on evaluation today patient appears to be doing well in regard to his left lateral lower surety ulcer. He's been tolerating the dressing changes without complication. He does note that he is having no pain, no excessive drainage or discharge, and overall he feels like things are going about how he would expect and hope they would. Overall he seems to have no evidence of infection at this time in my opinion which is  good news. 12/04/17-He is seen in follow-up evaluation for right lateral lower extremity ulcer. He has been applying topical steroid cream. Today's measurement show slight increase in size. Over the next 2 [name] we will transition to every other day Santyl and steroid cream. He has been encouraged to monitor for changes and notify clinic with any concerns 12/15/17 on evaluation today patient's left lateral motion the ulcer and fortunately is doing worse again at this point. This just since last week to this week has close to doubled in size according to the patient. I did not seeing last week's I do not have a visual to compare this to in our system was also down so we do not have all the charts and at this point. Nonetheless it does have me somewhat concerned in regard to the fact that again he was worried enough about it he has contact the dermatology that placed them back on the full strength, 50 mg a day of the prednisone that he was taken previous. He continues to alternate using clobetasol along with Santyl at this point. He is obviously somewhat frustrated. 12/22/17 on evaluation today patient appears to be doing a little worse compared to last evaluation. Unfortunately the wound is a little deeper and slightly larger than the last week's evaluation. With that being said he has made some progress in regard to the irritation surrounding at this time unfortunately despite that progress that's been made he still has a significant issue going on here. 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 For that reason I discussed with him today the possibility of perform a wound culture to ensure there's nothing overtly infected. 01/06/18 on evaluation today patient's wound is larger and deeper than previously evaluated. With that being said it did appear that  his wound was infected after my last evaluation with him. Subsequently I did end up prescribing a prescription for Bactrim DS which she has been taking and having no complication with. Fortunately there does not appear to be any evidence of Sando, Barnard ([id number]) infection at this point in time as far as anything spreading, no want to  touch, and overall I feel like things are showing signs of improvement. 01/13/18 on evaluation today patient appears to be even a little larger and deeper than last time. There still muscle exposed in the base of the wound. Nonetheless he does appear to be less erythematous I do believe inflammation is calming down also believe the infection looks like it's probably resolved at this time based on what I'm seeing. No fevers, chills, nausea, or vomiting noted at this time. 01/30/18 on evaluation today patient actually appears to visually look better for the most part. Unfortunately those visually this looks better he does seem to potentially have what may be an abscess in the muscle that has been noted in the central portion of the wound. This is the first time that I have noted what appears to be fluctuance in the central portion of the muscle. With that being said I'm somewhat more concerned about the fact that this might indicate an abscess formation at this location. I do believe that an ultrasound would be appropriate. This is likely something we need to try to do as soon as possible. He has been switch to mupirocin ointment and he is no longer using the steroid ointment as prescribed by dermatology he sees them again next week he's been decreased from 60 to 40 mg of prednisone. 03/09/18 on evaluation today patient actually appears to be doing a little better compared to last time I saw him. There's not as much erythema surrounding the wound itself. He I did review his most recent infectious disease note which was dated 02/24/18. He saw Dr. Michel Bickers in  [place]. 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 Patient is now off of antibiotics and they are continually observing this. There seems to be no change in the past two [name] in my pinion based on what the patient says and what I see today compared to what Dr. Megan Salon likely saw two [name] ago. No fevers, chills, nausea, or vomiting noted at this time. 03/23/18 on evaluation today patient's wound actually appears to be showing signs of improvement which is good news. He is currently still on the Dapsone. He is also working on tapering the prednisone to get off of this and Dr. Lottie Rater is working with him in this regard. Nonetheless overall I feel like the wound is doing well it does appear based on the infectious disease note that I reviewed from Dr. 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 I did also review the results of his ultrasound of left lower extremity which revealed there was a dentist tissue in the base of the wound without an abscess noted. 04/06/18 on evaluation today the patient's left lateral lower extremity ulcer actually appears to be doing fairly well which is excellent news. There does not appear to be any evidence of infection at this time which is also great news. Overall he still does have a significantly large ulceration although little by little he seems to be making progress. He is down to 10 mg a day of the prednisone. 04/20/18 on evaluation today patient actually appears to be doing excellent at this time in regard to his left lower extremity ulcer. He's making signs of good progress unfortunately this is taking much longer than we would really like  to see but nonetheless he is making progress. Fortunately there does not appear to be any evidence of infection at this  time. No fevers, chills, nausea, or vomiting noted at this time. The patient has not been using the Santyl due to the cost he hadn't got in this field yet. He's mainly been using the antibiotic ointment topically. Subsequently he also tells me that he really has not been scrubbing in the shower I think this would be helpful again as I told him it doesn't have to be anything too aggressive to even make it believe just enough to keep it free of some of the loose slough and biofilm on the wound surface. 05/11/18 on evaluation today patient's wound appears to be making slow but sure progress in regard to the left lateral lower extremity ulcer. He is been tolerating the dressing changes without complication. Fortunately there does not appear to be any evidence of infection at this time. He is still just using triple antibiotic ointment along with clobetasol occasionally over the area. He never got the Santyl and really does not seem to intend to in my pinion. 06/01/18 on evaluation today patient appears to be doing a little better in regard to his left lateral lower extremity ulcer. He states that overall he does not feel like he is doing as well with the Dapsone as he did with the prednisone. Nonetheless he sees his dermatologist later today and is gonna talk to them about the possibility of going back on the prednisone. Overall again I believe that the wound would be better if you would utilize Santyl but he really does not seem to be interested in going back to the [place] at this point. He has been using triple antibiotic ointment. 06/15/18 on evaluation today patient's wound actually appears to be doing about the same at this point. Fortunately there is no signs of infection at this time. He has made slight improvements although he continues to not really want to clean the wound bed at this point. He states that he just doesn't mess with it he doesn't want to cause any problems with everything else  he has going on. He has been on medication, antibiotics as prescribed by his dermatologist, for a staff infection of his lower extremities which is really drying out now and looking much better he tells me. Fortunately there is no sign of overall infection. SHUBHAM, THACKSTON ([id number]) 06/29/18 on evaluation today patient appears to be doing well in regard to his left lateral lower surety ulcer all things considering. Fortunately his staff infection seems to be greatly improved compared to previous. He has no signs of infection and this is drying up quite nicely. He is still the doxycycline for this is no longer on cental, Dapsone, or any of the other medications. His dermatologist has recommended possibility of an infusion but right now he does not want to proceed with that. 07/13/18 on evaluation today patient appears to be doing about the same in regard to his left lateral lower surety ulcer. Fortunately there's no signs of infection at this time which is great news. Unfortunately he still builds up a significant amount of Slough/biofilm of the surface of the wound he still is not really cleaning this as he should be appropriately. Again I'm able to easily with saline and gauze remove the majority of this on the surface which if you would do this at home would likely be a dramatic improvement for him as far as getting the area  to improve. Nonetheless overall I still feel like he is making progress is just very slow. I think Santyl will be of benefit for him as well. Still he has not gotten this as of this point. 07/27/18 on evaluation today patient actually appears to be doing little worse in regards of the erythema around the periwound region of the wound he also tells me that he's been having more drainage currently compared to what he was experiencing last time I saw him. He states not quite as bad as what he had because this was infected previously but nonetheless is still appears to be doing  poorly. Fortunately there is no evidence of systemic infection at this point. The patient tells me that he is not going to be able to afford the Santyl. He is still waiting to hear about the infusion therapy with his dermatologist. Apparently she wants an updated colonoscopy first. 08/10/18 on evaluation today patient appears to be doing better in regard to his left lateral lower extremity ulcer. Fortunately he is showing signs of improvement in this regard he's actually been approved for Remicade infusion's as well although this has not been scheduled as of yet. Fortunately there's no signs of active infection at this time in regard to the wound although he is having some issues with infection of the right lower extremity is been seen as dermatologist for this. Fortunately they are definitely still working with him trying to keep things under control. 09/07/18 on evaluation today patient is actually doing rather well in regard to his left lateral lower extremity ulcer. He notes these actually having some hair grow back on his extremity which is something he has not seen in years. He also tells me that the pain is really not giving them any trouble at this time which is also good news overall she is very pleased with the progress he's using a combination of the mupirocin along with the probate is all mixed. 09/21/18 on evaluation today patient actually appears to be doing fairly well all things considered in regard to his looks from the ulcer. He's been tolerating the dressing changes without complication. Fortunately there's no signs of active infection at this time which is good news he is still on all antibiotics or prevention of the staff infection. He has been on prednisone for time although he states it is gonna contact his dermatologist and see if she put them on a short course due to some irritation that he has going on currently. Fortunately there's no evidence of any overall worsening this is  going very slow I think cental would be something that would be helpful for him although he states that $50 for tube is quite expensive. He therefore is not willing to get that at this point. 10/06/18 on evaluation today patient actually appears to be doing decently well in regard to his left lateral leg ulcer. He's been tolerating the dressing changes without complication. Fortunately there's no signs of active infection at this time. Overall I'm actually rather pleased with the progress he's making although it's slow he doesn't show any signs of infection and he does seem to be making some improvement. I do believe that he may need a switch up and dressings to try to help this to heal more appropriately and quickly. 10/19/18 on evaluation today patient actually appears to be doing better in regard to his left lateral lower extremity ulcer. This is shown signs of having much less Slough buildup at this point due to  the fact he has been using the Santyl. Obviously this is very good news. The overall size of the wound is not dramatically smaller but again the appearance is. 11/02/18 on evaluation today patient actually appears to be doing quite well in regard to his lower Trinity ulcer. A lot of the skin around the ulcer is actually somewhat irritating at this point this seems to be more due to the dressing causing irritation from the adhesive that anything else. Fortunately there is no signs of active infection at this time. 11/24/18 on evaluation today patient appears to be doing a little worse in regard to his overall appearance of his lower extremity ulcer. There's more erythema and warmth around the wound unfortunately. He is currently on doxycycline which he has been on for some time. With that being said I'm not sure that seems to be helping with what appears to possibly be an acute cellulitis with regard to his left lower extremity ulcer. No fevers, chills, nausea, or vomiting noted at this  time. 12/08/18 on evaluation today patient's wounds actually appears to be doing significantly better compared to his last evaluation. He has been using Santyl along with alternating tripling about appointment as well as the steroid cream seems to be doing [place], Tao ([id number]) quite well and the wound is showing signs of improvement which is excellent news. Fortunately there's no evidence of infection and in fact his culture came back negative with only normal skin flora noted. 12/21/2018 upon evaluation today patient actually appears to be doing excellent with regard to his ulcer. This is actually the best that I have seen it since have been helping to take care of him. It is both smaller as well as less slough noted on the surface of the wound and seems to be showing signs of good improvement with new skin growing from the edges. He has been using just the triamcinolone he does wonder if he can get a refill of that ointment today. 01/04/2019 upon evaluation today patient actually appears to be doing well with regard to his left lateral lower extremity ulcer. With that being said it does not appear to be that he is doing quite as well as last time as far as progression is concerned. There does not appear to be any signs of infection or significant irritation which is good news. With that being said I do believe that he may benefit from switching to a collagen based dressing based on how clean The wound appears. 01/18/2019 on evaluation today patient actually appears to be doing well with regard to his wound on the left lower extremity. He is not made a lot of progress compared to where we were previous but nonetheless does seem to be doing okay at this time which is good news. There is no signs of active infection which is also good news. My only concern currently is I do wish we can get him into utilizing the collagen dressing his insurance would not pay for the supplies that we ordered although  it appears that he may be able to order this through his supply company that he typically utilizes. This is [employer]. Nonetheless he did try to order it during the office visit today and it appears this did go through. We will see if he can get that it is a different brand but nonetheless he has collagen and I do think will be beneficial. 02/01/2019 on evaluation today patient actually appears to be doing a little worse today in regard  to the overall size of his wounds. Fortunately there is no signs of active infection at this time. That is visually. Nonetheless when this is happened before it was due to infection. For that reason were somewhat concerned about that this time as well. 02/08/2019 on evaluation today patient unfortunately appears to be doing slightly worse with regard to his wound upon evaluation today. Is measuring a little deeper and a little larger unfortunately. I am not really sure exactly what is causing this to enlarge he actually did see his dermatologist she is going to see about initiating Humira for him. Subsequently she also did do steroid injections into the wound itself in the periphery. Nonetheless still nonetheless he seems to be getting a little bit larger he is gone back to just using the steroid cream topically which I think is appropriate. I would say hold off on the collagen for the time being is definitely a good thing to do. Based on the culture results which we finally did get the final result back regarding it shows staph as the bacteria noted again that can be a normal skin bacteria based on the fact however he is having increased drainage and worsening of the wound measurement wise I would go ahead and place him on an antibiotic today I do believe for this. 02/15/2019 on evaluation today patient actually appears to be doing somewhat better in regard to his ulcer. There is no signs of worsening at this time I did review his culture results which showed evidence  of Staphylococcus aureus but not MRSA. Again this could just be more related to the normal skin bacteria although he states the drainage has slowed down quite a bit he may have had a mild infection not just colonization. And was much smaller and then since around10/04/2019 on evaluation today patient appears to be doing unfortunately worse as far as the size of the wound. I really feel like that this is steadily getting larger again it had been doing excellent right at the beginning of September we have seen a steady increase in the area of the wound it is almost 2-1/2 times the size it was on September 1. Obviously this is a bad trend this is not wanting to see. For that reason we went back to using just the topical triamcinolone cream which does seem to help with inflammation. I checked him for bacteria by way of culture and nothing showed positive there. I am considering giving him a short course of a tapering steroid Dosepak today to see if that is can be beneficial for him. The patient is in agreement with giving that a try. 03/08/2019 on evaluation today patient appears to be doing very well in comparison to last evaluation with regard to his lower extremity ulcer. This is showing signs of less inflammation and actually measuring slightly smaller compared to last time every other week over the past month and a half he has been measuring larger larger larger. Nonetheless I do believe that the issue has been inflammation the prednisone does seem to have been beneficial for him which is good news. No fevers, chills, nausea, vomiting, or diarrhea. 03/22/2019 on evaluation today patient appears to be doing about the same with regard to his leg ulcer. He has been tolerating the dressing changes without complication. With that being said the wound seems to be mostly arrested at its current size but really is not making any progress except for when we prescribed the prednisone. He did show some  signs  of dropping as far as the overall size of the wound during that interval week. Nonetheless this is something he is not on long-term at this point and unfortunately I think he is getting need either this or else the Humira which his dermatologist has discussed try to get approval for. With that being said he will be seeing his dermatologist on the 11th of this month that is November. LORIN, GAWRON ([id number]) 04/19/2019 on evaluation today patient appears to be doing really about the same the wound is measuring slightly larger compared to last time I saw him. He has not been into the office since November 2 due to the fact that he unfortunately had Covid as that his entire family. He tells me that it was rough but they did pull-through and he seems to be doing much better. Fortunately there is no signs of active infection at this time. No fevers, chills, nausea, vomiting, or diarrhea. 05/10/2019 on evaluation today patient unfortunately appears to be doing significantly worse as compared to last time I saw him. He does tell me that he has had his first dose of Humira and actually is scheduled to get the next one in the upcoming week. With that being said he tells me also that in the past several days he has been having a lot of issues with green drainage she showed me a picture this is more blue-green in color. He is also been having issues with increased sloughy buildup and the wound does appear to be larger today. Obviously this is not the direction that we want everything to take based on the starting of his Humira. Nonetheless I think this is definitely a result of likely infection and to be honest I think this is probably Pseudomonas causing the infection based on what I am seeing. 05/24/2019 on evaluation today patient unfortunately appears to be doing significantly worse compared to his prior evaluation with me 2 [name] ago. I did review his culture results which showed that he does have Staph  aureus as well as Pseudomonas noted on the culture. Nonetheless the Levaquin that I prescribed for him does not appear to have been appropriate and in fact he tells me he is no longer experiencing the green drainage and discharge that he had at the last visit. Fortunately there is no signs of active infection at this time which is good news although the wound has significantly worsened it in fact is much deeper than it was previous. We have been utilizing up to this point triamcinolone ointment as the prescription topical of choice but at this time I really feel like that the wound is getting need to be packed in order to appropriately manage this due to the deeper nature of the wound. Therefore something along the lines of an alginate dressing may be more appropriate. 05/31/2019 upon inspection today patient's wound actually showed signs of doing poorly at this point. Unfortunately he just does not seem to be making any good progress despite what we have tried. He actually did go ahead and pick up the Cipro and start taking that as he was noticing more green drainage he had previously completed the Levaquin that I prescribed for him as well. Nonetheless he missed his appointment for the seventh last week on Wednesday with the wound care center and [facility] where his dermatologist referred him. Obviously I do think a second opinion would be helpful at this point especially in light of the fact that the patient  seems to be doing so poorly despite the fact that we have tried everything that I really know how at this point. The only thing that ever seems to have helped him in the past is when he was on high doses of continual steroids that did seem to make a difference for him. 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 06/07/2019 upon  evaluation today patient unfortunately appears to be doing worse yet again with regard to his wound. In fact I am starting to question whether or not he may have a fluid pocket in the muscle at this point based on the bulging and the soft appearance to the central portion of the muscle area. There is not anything draining from the muscle itself at this time which is good news but nonetheless the wound is expanding. I am not really seeing any results of the Humira as far as overall wound progression based on what I am seeing at this point. The patient has been referred for second opinion with regard to his wound to the [facility] wound care center by his dermatologist which I definitely am not in opposition to. Unfortunately we tried multiple dressings in the past including collagen, alginate, and at one point even Hydrofera Blue. With that being said he is never really used it for any significant amount of time due to the fact that he often complains of pain associated with these dressings and then will go back to either using the Santyl which she has done intermittently or more frequently the triamcinolone. He is also using his own compression stockings. We have wrapped him in the past but again that was something else that he really was not a big fan of. 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 He seemed to do much better when he was on the steroids although honestly I understand that the steroids are not necessarily the best medication to be on long-term obviously Electronic Signature(s) Signed: 06/07/2019 1:02:28 PM By: [name] Keeler PA-C Entered By: [name] on 06/07/2019  13:02:28 DAIDEN, [name] ([id number]) -------------------------------------------------------------------------------- Physical Exam Details Patient Name: [name] Date of Service: 06/07/2019 8:00 AM Medical Record Number: [id number] Patient Account Number: [id number] Date of Birth/Sex: November 19, 1980 (39 y.o. M) Treating RN: [name] Primary Care Provider: Alma [name] Other Clinician: Referring Provider: Alma [name] Treating Provider/Extender: [name], Bastien Strawser [name] in Treatment: 130 Constitutional Well-nourished and well-hydrated in no acute distress. Respiratory normal breathing without difficulty. Psychiatric this patient is able to make decisions and demonstrates good insight into disease process. Alert and Oriented x 3. pleasant and cooperative. Notes Patient's wound bed currently showed signs of worsening I am somewhat concerned about this obviously. Fortunately there is no evidence of active infection which is good news unfortunately the patient nonetheless still continues to have worsening of the wound as far as size and overall appearance and again I am starting to question whether or not he may have a fluid pocket under  the muscle based on evaluation today. We previously performed an ultrasound that did not reveal anything but again that could have changed in the interim since that time till now. Electronic Signature(s) Signed: 06/07/2019 1:06:12 PM By: [name] Keeler PA-C Entered By: [name] on 06/07/2019 13:06:11 [name] ([id number]) -------------------------------------------------------------------------------- Physician Orders Details Patient Name: [name] Date of Service: 06/07/2019 8:00 AM Medical Record Number: [id number] Patient Account Number: [id number] Date of Birth/Sex: 1981/01/24 (39 y.o. M) Treating RN: [name] Primary Care Provider: Alma [name] Other Clinician: Referring Provider: Alma [name] Treating  Provider/Extender: [name], Carleena Mires [name] in Treatment: 130 Verbal / Phone Orders: No Diagnosis Coding ICD-10 Coding Code Description [phone] Non-pressure chronic ulcer of left calf with fat layer exposed L88 Pyoderma gangrenosum I87.2 Venous insufficiency (chronic) (peripheral) L03.116 Cellulitis of left lower limb Wound Cleansing Wound #1 Left,Lateral Lower Leg o Clean wound with Normal Saline. Anesthetic (add to Medication List) Wound #1 Left,Lateral Lower Leg o Topical Lidocaine 4% cream applied to wound bed prior to debridement (In Clinic Only). Skin Barriers/Peri-Wound Care Wound #1 Left,Lateral Lower Leg o Triamcinolone Acetonide Ointment (TCA) Primary Wound Dressing o Dry Gauze Secondary Dressing Wound #1 Left,Lateral Lower Leg o Boardered Foam Dressing Dressing Change Frequency Wound #1 Left,Lateral Lower Leg o Change dressing every other day. Follow-up Appointments Wound #1 Left,Lateral Lower Leg o Return Appointment in 1 week. Edema Control Wound #1 Left,Lateral Lower Leg o Elevate legs to the level of the heart and pump ankles as often as possible Electronic Signature(s) Signed: 06/07/2019 4:47:43 PM By: Hedy Camara, Kotaro ([id number]) Signed: 06/07/2019 5:04:31 PM By: [name], [name], RN, [name], [name], [name] Entered By: [name], [name], RN, [name], [name] on 06/07/2019 08:32:31 MATEEN, FRANSSEN ([id number]) -------------------------------------------------------------------------------- Problem List Details Patient Name: [name] Date of Service: 06/07/2019 8:00 AM Medical Record Number: [id number] Patient Account Number: [id number] Date of Birth/Sex: 1981-01-31 (39 y.o. M) Treating RN: [name] Primary Care Provider: Alma [name] Other Clinician: Referring Provider: Alma [name] Treating Provider/Extender: [name], Ozie Lupe [name] in Treatment: 130 Active Problems ICD-10 Evaluated Encounter Code Description Active Date Today  Diagnosis L97.222 Non-pressure chronic ulcer of left calf with fat layer exposed 12/04/2016 No Yes L88 Pyoderma gangrenosum 03/26/2017 No Yes I87.2 Venous insufficiency (chronic) (peripheral) 12/04/2016 No Yes L03.116 Cellulitis of left lower limb 05/24/2019 No Yes Inactive Problems ICD-10 Code Description Active Date Inactive Date L97.213 Non-pressure chronic ulcer of right calf with necrosis of muscle 04/02/2017 04/02/2017 Resolved Problems Electronic Signature(s) Signed: 06/07/2019 8:23:36 AM By: [name] Keeler PA-C Entered By: [name] on 06/07/2019 08:23:35 [name] ([id number]) -------------------------------------------------------------------------------- Progress Note Details Patient Name: [name] Date of Service: 06/07/2019 8:00 AM Medical Record Number: [id number] Patient Account Number: [id number] Date of Birth/Sex: [birth year] (39 y.o. M) Treating RN: [name] Primary Care Provider: Alma [name] Other Clinician: Referring Provider: Alma [name] Treating Provider/Extender: [name], Breslyn Abdo [name] in Treatment: 130 Subjective Chief Complaint Information obtained from Patient He is here in follow up evaluation for LLE pyoderma ulcer History of Present Illness (HPI) 12/04/16; 39 year old man who comes into the clinic today for review of a wound on the posterior left calf. He tells me that is been there for about a year. He is not a diabetic he does smoke half a pack per day. He was seen in the ER on 11/20/16 felt to have cellulitis around the wound and was given clindamycin. An x-ray did not show osteomyelitis. The patient initially tells me that he has a  milk allergy that sets off a pruritic itching rash on his lower legs which she scratches incessantly and he thinks that's what may have set up the wound. He has been using various topical antibiotics and ointments without any effect. He works in a [employer]ing [employer] and is on his feet all day. He does not have a  prior history of wounds however he does have the rash on both lower legs the right arm and the ventral aspect of his left arm. These are excoriations and clearly have had scratching however there are of macular looking areas on both legs including a substantial larger area on the right leg. This does not have an underlying open area. There is no blistering. The patient tells me that 2 years ago in [place] in response to the rash on his legs he saw a dermatologist who told him he had a condition which may be pyoderma gangrenosum although I may be putting words into his mouth. He seemed to recognize this. 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 He is not in any treatment for this. He's had no recent travel 12/11/16; the patient arrives today with his wound and roughly the same condition we've been using silver alginate this is a deep punched out wound with some surrounding erythema but no tenderness. Biopsy I did did not show confirmed pyoderma gangrenosum suggested nonspecific inflammation and vasculitis but does not provide an actual description of what was seen by the pathologist. I'm really not able to understand this We have also received information from the patient's dermatologist in [place] notes from April 2016. This was a doctor Agarwal- antal. 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 He was prescribed topical steroid high potency under occlusion which helped but at this point the patient did not have a deep punched out wound. 12/18/16; the patient's wound is larger in terms of surface area however this surface looks better and there is less depth. The surrounding erythema also is better. The patient states that the wrap we put on came off 2 days ago when he has been using his compression stockings. He we are in the process of getting a dermatology consult. 12/26/16 on evaluation today patient's left lower extremity wound shows evidence  of infection with surrounding erythema noted. He has been tolerating the dressing changes but states that he has noted more discomfort. There is a larger area of erythema surrounding the wound. No fevers, chills, nausea, or vomiting noted at this time. With that being said the wound still does have slough covering the surface. He is not allergic to any medication that he is aware of at this point. In regard to his right lower extremity he had several regions that are erythematous and pruritic he wonders if there's anything we can do to help that. 01/02/17 I reviewed patient's wound culture which was obtained his visit last week. He was placed on doxycycline at that point. Unfortunately that does not appear to be an antibiotic that would likely help with the situation however the pseudomonas noted on culture is sensitive to Cipro. Also unfortunately patient's wound seems to have a large compared to last week's evaluation. Not severely so but there are definitely increased measurements in general. He is continuing to have discomfort as well he writes this to be a seven out of 10. In fact he would prefer me not to perform any debridement today due to the fact that he is having discomfort and  considering he has an active infection on the little reluctant to do so anyway. No fevers, Brue, Domenic ([id number]) chills, nausea, or vomiting noted at this time. 01/08/17; patient seems dermatology on September 5. I suspect dermatology will want the slides from the biopsy I did sent to their pathologist. I'm not sure if there is a way we can expedite that. In any case the culture I did before I left on vacation 3 [name] ago showed Pseudomonas he was given 10 days of Cipro and per her description of her intake nurses is actually somewhat better this week although the wound is quite a bit bigger than I remember the last time I saw this. He still has 3 more days of Cipro 01/21/17; dermatology appointment tomorrow. He  has completed the ciprofloxacin for Pseudomonas. Surface of the wound looks better however he is had some deterioration in the lesions on his right leg. Meantime the left lateral leg wound we will continue with sample 01/29/17; patient had his dermatology appointment but I can't yet see that note. He is completed his antibiotics. The wound is more superficial but considerably larger in circumferential area than when he came in. This is in his left lateral calf. He also has swollen erythematous areas with superficial wounds on the right leg and small papular areas on both arms. There apparently areas in her his upper thighs and buttocks I did not look at those. Dermatology biopsied the right leg. Hopefully will have their input next week. 02/05/17; patient went back to see his dermatologist who told him that he had a "scratching problem" as well as staph. He is now on a 30 day course of doxycycline and I believe she gave him triamcinolone cream to the right leg areas to help with the itching [not exactly sure but probably triamcinolone]. She apparently looked at the left lateral leg wound although this was not rebiopsied and I think felt to be ultimately part of the same pathogenesis. He is using sample border foam and changing nevus himself. He now has a new open area on the right posterior leg which was his biopsy site I don't have any of the dermatology notes 02/12/17; we put the patient in compression last week with SANTYL to the wound on the left leg and the biopsy. Edema is much better and the depth of the wound is now at level of skin. Area is still the same Biopsy site on the right lateral leg we've also been using santyl with a border foam dressing and he is changing this himself. 02/19/17; Using silver alginate started last week to both the substantial left leg wound and the biopsy site on the right wound. He is tolerating compression well. Has a an appointment with his primary M.D. tomorrow  wondering about diuretics although I'm wondering if the edema problem is actually lymphedema 02/26/17; the patient has been to see his primary doctor Dr. Jerrel Ivory at [facility] our primary care. She started him on Lasix 20 mg and this seems to have helped with the edema. However we are not making substantial change with the left lateral calf wound and inflammation. The biopsy site on the right leg also looks stable but not really all that different. 03/12/17; the patient has been to see vein and vascular Dr. Lucky Cowboy. He has had venous reflux studies I have not reviewed these. I did get a call from his dermatology office. They felt that he might have pathergy based on their biopsy on his right leg which  led them to look at the slides of the biopsy I did on the left leg and they wonder whether this represents pyoderma gangrenosum which was the original supposition in a man with ulcerative colitis albeit inactive for many years. They therefore recommended clobetasol and tetracycline i.e. 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 03/26/17; apparently the patient just had reflux studies not an appointment with Dr. dew. She arrives in clinic today having applied clobetasol for 2-3 [name]. He notes over the last 2-3 days excessive drainage having to change the dressing 3-4 times a day and also expanding erythema. 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 Lillia Carmel at [facility] dermatology locally who kindly saw him at my request. 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 My concern was the degree of erythema medially extending from his left leg wound which was either inflammation from  pyoderma or cellulitis. I put him on Augmentin however culture of the wound showed Pseudomonas which is quinolone sensitive. I really don't believe he has cellulitis however in view of everything I will continue and give him a course of Cipro. He is also on doxycycline as an immune modulator for the pyoderma. In addition to his original wound on the left lateral leg with surrounding erythema he has a wound on the right posterior calf which was an original biopsy site done by dermatology. 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 Saw Dr. Lillia Carmel yesterday. He has been using topical antibiotics to both wound areas his original wound on the left and the biopsies/pathergy area on the right. There is definitely some improvement in the inflammation around the wound on the right although the patient states he has increasing sensitivity of the wounds. He is on prednisone 60 and doxycycline 1 as prescribed by Dr. Lillia Carmel. He is covering the topical antibiotic with gauze and putting this in his own compression stocks and changing this daily. He states that Dr. Lottie Rater did a culture of the left leg wound yesterday 05/07/17; pyoderma gangrenosum. The patient saw Dr. Lillia Carmel yesterday and has a follow-up with her in one month. He is still using topical antibiotics to both wounds although he can't recall exactly what type. He is still on prednisone 60 mg. Dr. Lillia Carmel stated that the doxycycline could stop if we were in agreement. 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 The right medial leg was induced by [name] ([id number]) biopsy/pathergy. The area on the right is essentially healed. 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 The right  medial leg has closed and remains closed. He is still on prednisone 60. He tells me he missed his last dermatology appointment with Dr. Lillia Carmel but will make another appointment. He reports that her blood sugar at a recent screen in [place] was high 200's. He was 180 today. 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 I think he is going to require still much longer prednisone perhaps another 3 months before attempting to taper. In the meantime his wound is a  lot better. Smaller. He is cleaning this off daily and applying topical antibiotics. When he was last in the clinic I thought about changing to [facility] and actually put in a couple of calls to dermatology although probably not during their business hours. 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 He continues on oral prednisone. He has been using triple antibiotic ointment. There is surface debris and we will transition to [facility] and have him return in 2 [name]. He has lost 30 pounds since his last appointment with lifestyle modification. He may benefit from topical steroid cream for treatment this can be considered at a later date. 08/22/17 on evaluation today patient appears to actually be doing rather well in regard to his left lateral lower extremity ulcer. He has actually been managed by Dr. Dellia Nims most recently. Patient is currently on oral steroids at this time. This seems to have been of benefit for him. Nonetheless his last visit was actually with Leah on 08/06/17. Currently he is not utilizing any topical steroid creams although this could be of benefit as well. No fevers, chills, nausea, or vomiting noted at this time. 09/05/17 on evaluation today patient appears to be doing better in regard to his left lateral lower extremity ulcer. He has been tolerating the dressing changes without complication. He  is using Santyl with good effect. Overall I'm very pleased with how things are standing at this point. Patient likewise is happy that this is doing better. 09/19/17 on evaluation today patient actually appears to be doing rather well in regard to his left lateral lower extremity ulcer. 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 He's not having any significant pain. 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 He has been tolerating the Santyl without complication and in general I feel like he's making good progress. 10/17/17 on evaluation today patient appears to be doing very well in regard to his left lateral lower surety ulcer. He has been tolerating the dressing changes without complication. There does not appear to be any evidence of infection he's alternating the Santyl and the triple antibiotic ointment every other day this seems to be doing well for him. 11/03/17 on evaluation today patient appears to be doing very well in regard to his left lateral lower extremity ulcer. He is been tolerating the dressing changes without complication which is good news. Fortunately there does not appear to be any evidence of infection which is also great news. Overall is doing excellent they are starting to taper down on the prednisone is down to 40 mg at this point it also started topical clobetasol for him. 11/17/17 on evaluation today patient appears to be doing well in regard to his left lateral lower surety ulcer. He's been tolerating the dressing changes without complication. He does note that he is having no pain, no excessive drainage or discharge, and overall he feels like things are going about how he would expect and hope they would. Overall he seems to have no evidence of infection at this time in my opinion which is  good news. 12/04/17-He is seen in follow-up evaluation for right lateral lower extremity ulcer. He has been applying topical steroid cream.  Today's measurement show slight increase in size. Over the next 2 [name] we will transition to every other day Santyl and steroid cream. He has been encouraged to monitor for changes and notify clinic with any concerns 12/15/17 on evaluation today patient's left lateral motion the ulcer and fortunately is doing worse again at this point. This just since last week to this week has close to doubled in size according to the patient. I did not seeing last week's I do not have a visual to compare this to in our system was also down so we do not have all the charts and at this point. Nonetheless it does have me somewhat concerned in regard to the fact that again he was worried enough about it he has contact the dermatology that placed them back on the full strength, 50 mg a day of the prednisone that he was taken previous. He continues to alternate using clobetasol along with Santyl at this point. He is obviously somewhat frustrated. 12/22/17 on evaluation today patient appears to be doing a little worse compared to last evaluation. Unfortunately the wound is a little deeper and slightly larger than the last week's evaluation. With that being said he has made some progress in regard to the irritation surrounding at this time unfortunately despite that progress that's been made he still has a significant issue going on here. I'm not certain that he is having really any true infection at this time although with the Pyoderma gangrenosum it can Pfiffner, Muadh ([id number]) sometimes be difficult to differentiate infection versus just inflammation. For that reason I discussed with him today the possibility of perform a wound culture to ensure there's nothing overtly infected. 01/06/18 on evaluation today patient's wound is larger and deeper than previously evaluated. With that  being said it did appear that his wound was infected after my last evaluation with him. Subsequently I did end up prescribing a prescription for Bactrim DS which she has been taking and having no complication with. Fortunately there does not appear to be any evidence of infection at this point in time as far as anything spreading, no want to touch, and overall I feel like things are showing signs of improvement. 01/13/18 on evaluation today patient appears to be even a little larger and deeper than last time. There still muscle exposed in the base of the wound. Nonetheless he does appear to be less erythematous I do believe inflammation is calming down also believe the infection looks like it's probably resolved at this time based on what I'm seeing. No fevers, chills, nausea, or vomiting noted at this time. 01/30/18 on evaluation today patient actually appears to visually look better for the most part. Unfortunately those visually this looks better he does seem to potentially have what may be an abscess in the muscle that has been noted in the central portion of the wound. This is the first time that I have noted what appears to be fluctuance in the central portion of the muscle. With that being said I'm somewhat more concerned about the fact that this might indicate an abscess formation at this location. I do believe that an ultrasound would be appropriate. This is likely something we need to try to do as soon as possible. He has been switch to mupirocin ointment and he is no longer using the steroid ointment as prescribed by dermatology he sees them again next week he's been decreased from 60 to 40 mg of prednisone. 03/09/18 on evaluation today patient actually  appears to be doing a little better compared to last time I saw him. There's not as much erythema surrounding the wound itself. He I did review his most recent infectious disease note which was dated 02/24/18. He saw Dr. Michel Bickers in  [place]. 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 Patient is now off of antibiotics and they are continually observing this. There seems to be no change in the past two [name] in my pinion based on what the patient says and what I see today compared to what Dr. Megan Salon likely saw two [name] ago. No fevers, chills, nausea, or vomiting noted at this time. 03/23/18 on evaluation today patient's wound actually appears to be showing signs of improvement which is good news. He is currently still on the Dapsone. He is also working on tapering the prednisone to get off of this and Dr. Lottie Rater is working with him in this regard. Nonetheless overall I feel like the wound is doing well it does appear based on the infectious disease note that I reviewed from Dr. 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 I did also review the results of his ultrasound of left lower extremity which revealed there was a dentist tissue in the base of the wound without an abscess noted. 04/06/18 on evaluation today the patient's left lateral lower extremity ulcer actually appears to be doing fairly well which is excellent news. There does not appear to be any evidence of infection at this time which is also great news. Overall he still does have a significantly large ulceration although little by little he seems to be making progress. He is down to 10 mg a day of the prednisone. 04/20/18 on evaluation today patient actually appears to be doing excellent at this time in regard to his left lower extremity ulcer. He's making signs of good progress unfortunately this is taking much longer than we would really like to see but nonetheless he is making progress. Fortunately there does not appear to be any evidence of infection at this  time. No fevers, chills, nausea, or vomiting noted at this time. The patient has not been using the Santyl due to the cost he hadn't got in this field yet. He's mainly been using the antibiotic ointment topically. Subsequently he also tells me that he really has not been scrubbing in the shower I think this would be helpful again as I told him it doesn't have to be anything too aggressive to even make it believe just enough to keep it free of some of the loose slough and biofilm on the wound surface. 05/11/18 on evaluation today patient's wound appears to be making slow but sure progress in regard to the left lateral lower extremity ulcer. He is been tolerating the dressing changes without complication. Fortunately there does not appear to be any evidence of infection at this time. He is still just using triple antibiotic ointment along with clobetasol occasionally over the area. He never got the Santyl and really does not seem to intend to in my pinion. 06/01/18 on evaluation today patient appears to be doing a little better in regard to his left lateral lower extremity ulcer. He states that overall he does not feel like he is doing as well with the Dapsone as he  did with the prednisone. Nonetheless he sees his dermatologist later today and is gonna talk to them about the possibility of going back on the prednisone. Overall again I believe that the wound would be better if you would utilize Santyl but he really does not seem to be interested in going back to the [place] at this point. He has been using triple antibiotic ointment. EMET, RAFANAN ([id number]) 06/15/18 on evaluation today patient's wound actually appears to be doing about the same at this point. Fortunately there is no signs of infection at this time. He has made slight improvements although he continues to not really want to clean the wound bed at this point. He states that he just doesn't mess with it he doesn't want to cause any  problems with everything else he has going on. He has been on medication, antibiotics as prescribed by his dermatologist, for a staff infection of his lower extremities which is really drying out now and looking much better he tells me. Fortunately there is no sign of overall infection. 06/29/18 on evaluation today patient appears to be doing well in regard to his left lateral lower surety ulcer all things considering. Fortunately his staff infection seems to be greatly improved compared to previous. He has no signs of infection and this is drying up quite nicely. He is still the doxycycline for this is no longer on cental, Dapsone, or any of the other medications. His dermatologist has recommended possibility of an infusion but right now he does not want to proceed with that. 07/13/18 on evaluation today patient appears to be doing about the same in regard to his left lateral lower surety ulcer. Fortunately there's no signs of infection at this time which is great news. Unfortunately he still builds up a significant amount of Slough/biofilm of the surface of the wound he still is not really cleaning this as he should be appropriately. Again I'm able to easily with saline and gauze remove the majority of this on the surface which if you would do this at home would likely be a dramatic improvement for him as far as getting the area to improve. Nonetheless overall I still feel like he is making progress is just very slow. I think Santyl will be of benefit for him as well. Still he has not gotten this as of this point. 07/27/18 on evaluation today patient actually appears to be doing little worse in regards of the erythema around the periwound region of the wound he also tells me that he's been having more drainage currently compared to what he was experiencing last time I saw him. He states not quite as bad as what he had because this was infected previously but nonetheless is still appears to be doing  poorly. Fortunately there is no evidence of systemic infection at this point. The patient tells me that he is not going to be able to afford the Santyl. He is still waiting to hear about the infusion therapy with his dermatologist. Apparently she wants an updated colonoscopy first. 08/10/18 on evaluation today patient appears to be doing better in regard to his left lateral lower extremity ulcer. Fortunately he is showing signs of improvement in this regard he's actually been approved for Remicade infusion's as well although this has not been scheduled as of yet. Fortunately there's no signs of active infection at this time in regard to the wound although he is having some issues with infection of the right lower extremity is been seen as  dermatologist for this. Fortunately they are definitely still working with him trying to keep things under control. 09/07/18 on evaluation today patient is actually doing rather well in regard to his left lateral lower extremity ulcer. He notes these actually having some hair grow back on his extremity which is something he has not seen in years. He also tells me that the pain is really not giving them any trouble at this time which is also good news overall she is very pleased with the progress he's using a combination of the mupirocin along with the probate is all mixed. 09/21/18 on evaluation today patient actually appears to be doing fairly well all things considered in regard to his looks from the ulcer. He's been tolerating the dressing changes without complication. Fortunately there's no signs of active infection at this time which is good news he is still on all antibiotics or prevention of the staff infection. He has been on prednisone for time although he states it is gonna contact his dermatologist and see if she put them on a short course due to some irritation that he has going on currently. Fortunately there's no evidence of any overall worsening this is  going very slow I think cental would be something that would be helpful for him although he states that $50 for tube is quite expensive. He therefore is not willing to get that at this point. 10/06/18 on evaluation today patient actually appears to be doing decently well in regard to his left lateral leg ulcer. He's been tolerating the dressing changes without complication. Fortunately there's no signs of active infection at this time. Overall I'm actually rather pleased with the progress he's making although it's slow he doesn't show any signs of infection and he does seem to be making some improvement. I do believe that he may need a switch up and dressings to try to help this to heal more appropriately and quickly. 10/19/18 on evaluation today patient actually appears to be doing better in regard to his left lateral lower extremity ulcer. This is shown signs of having much less Slough buildup at this point due to the fact he has been using the [employer]. Obviously this is very good news. The overall size of the wound is not dramatically smaller but again the appearance is. 11/02/18 on evaluation today patient actually appears to be doing quite well in regard to his lower [place] ulcer. A lot of the skin around the ulcer is actually somewhat irritating at this point this seems to be more due to the dressing causing irritation from the adhesive that anything else. Fortunately there is no signs of active infection at this time. 11/24/18 on evaluation today patient appears to be doing a little worse in regard to his overall appearance of his lower extremity Gearheart, Hulbert ([id number]) ulcer. There's more erythema and warmth around the wound unfortunately. He is currently on doxycycline which he has been on for some time. With that being said I'm not sure that seems to be helping with what appears to possibly be an acute cellulitis with regard to his left lower extremity ulcer. No fevers, chills, nausea, or  vomiting noted at this time. 12/08/18 on evaluation today patient's wounds actually appears to be doing significantly better compared to his last evaluation. He has been using Santyl along with alternating tripling about appointment as well as the steroid cream seems to be doing quite well and the wound is showing signs of improvement which is excellent news. Fortunately there's  no evidence of infection and in fact his culture came back negative with only normal skin flora noted. 12/21/2018 upon evaluation today patient actually appears to be doing excellent with regard to his ulcer. This is actually the best that I have seen it since have been helping to take care of him. It is both smaller as well as less slough noted on the surface of the wound and seems to be showing signs of good improvement with new skin growing from the edges. He has been using just the triamcinolone he does wonder if he can get a refill of that ointment today. 01/04/2019 upon evaluation today patient actually appears to be doing well with regard to his left lateral lower extremity ulcer. With that being said it does not appear to be that he is doing quite as well as last time as far as progression is concerned. There does not appear to be any signs of infection or significant irritation which is good news. With that being said I do believe that he may benefit from switching to a collagen based dressing based on how clean The wound appears. 01/18/2019 on evaluation today patient actually appears to be doing well with regard to his wound on the left lower extremity. He is not made a lot of progress compared to where we were previous but nonetheless does seem to be doing okay at this time which is good news. There is no signs of active infection which is also good news. My only concern currently is I do wish we can get him into utilizing the collagen dressing his insurance would not pay for the supplies that we ordered although  it appears that he may be able to order this through his supply company that he typically utilizes. This is [employer]. Nonetheless he did try to order it during the office visit today and it appears this did go through. We will see if he can get that it is a different brand but nonetheless he has collagen and I do think will be beneficial. 02/01/2019 on evaluation today patient actually appears to be doing a little worse today in regard to the overall size of his wounds. Fortunately there is no signs of active infection at this time. That is visually. Nonetheless when this is happened before it was due to infection. For that reason were somewhat concerned about that this time as well. 02/08/2019 on evaluation today patient unfortunately appears to be doing slightly worse with regard to his wound upon evaluation today. Is measuring a little deeper and a little larger unfortunately. I am not really sure exactly what is causing this to enlarge he actually did see his dermatologist she is going to see about initiating Humira for him. Subsequently she also did do steroid injections into the wound itself in the periphery. Nonetheless still nonetheless he seems to be getting a little bit larger he is gone back to just using the steroid cream topically which I think is appropriate. I would say hold off on the collagen for the time being is definitely a good thing to do. Based on the culture results which we finally did get the final result back regarding it shows staph as the bacteria noted again that can be a normal skin bacteria based on the fact however he is having increased drainage and worsening of the wound measurement wise I would go ahead and place him on an antibiotic today I do believe for this. 02/15/2019 on evaluation today patient actually appears to  be doing somewhat better in regard to his ulcer. There is no signs of worsening at this time I did review his culture results which showed evidence  of Staphylococcus aureus but not MRSA. Again this could just be more related to the normal skin bacteria although he states the drainage has slowed down quite a bit he may have had a mild infection not just colonization. And was much smaller and then since around10/04/2019 on evaluation today patient appears to be doing unfortunately worse as far as the size of the wound. I really feel like that this is steadily getting larger again it had been doing excellent right at the beginning of September we have seen a steady increase in the area of the wound it is almost 2-1/2 times the size it was on September 1. Obviously this is a bad trend this is not wanting to see. For that reason we went back to using just the topical triamcinolone cream which does seem to help with inflammation. I checked him for bacteria by way of culture and nothing showed positive there. I am considering giving him a short course of a tapering steroid Dosepak today to see if that is can be beneficial for him. The patient is in agreement with giving that a try. 03/08/2019 on evaluation today patient appears to be doing very well in comparison to last evaluation with regard to his lower extremity ulcer. This is showing signs of less inflammation and actually measuring slightly smaller compared to last time every other week over the past month and a half he has been measuring larger larger larger. Nonetheless I do believe that the issue has been inflammation the prednisone does seem to have been beneficial for him which is good news. No fevers, chills, nausea, vomiting, or diarrhea. PONCE, SKILLMAN ([id number]) 03/22/2019 on evaluation today patient appears to be doing about the same with regard to his leg ulcer. He has been tolerating the dressing changes without complication. With that being said the wound seems to be mostly arrested at its current size but really is not making any progress except for when we prescribed the  prednisone. He did show some signs of dropping as far as the overall size of the wound during that interval week. Nonetheless this is something he is not on long-term at this point and unfortunately I think he is getting need either this or else the Humira which his dermatologist has discussed try to get approval for. With that being said he will be seeing his dermatologist on the 11th of this month that is November. 04/19/2019 on evaluation today patient appears to be doing really about the same the wound is measuring slightly larger compared to last time I saw him. He has not been into the office since November 2 due to the fact that he unfortunately had Covid as that his entire family. He tells me that it was rough but they did pull-through and he seems to be doing much better. Fortunately there is no signs of active infection at this time. No fevers, chills, nausea, vomiting, or diarrhea. 05/10/2019 on evaluation today patient unfortunately appears to be doing significantly worse as compared to last time I saw him. He does tell me that he has had his first dose of Humira and actually is scheduled to get the next one in the upcoming week. With that being said he tells me also that in the past several days he has been having a lot of issues with green drainage she  showed me a picture this is more blue-green in color. He is also been having issues with increased sloughy buildup and the wound does appear to be larger today. Obviously this is not the direction that we want everything to take based on the starting of his Humira. Nonetheless I think this is definitely a result of likely infection and to be honest I think this is probably Pseudomonas causing the infection based on what I am seeing. 05/24/2019 on evaluation today patient unfortunately appears to be doing significantly worse compared to his prior evaluation with me 2 [name] ago. I did review his culture results which showed that he does have  Staph aureus as well as Pseudomonas noted on the culture. Nonetheless the Levaquin that I prescribed for him does not appear to have been appropriate and in fact he tells me he is no longer experiencing the green drainage and discharge that he had at the last visit. Fortunately there is no signs of active infection at this time which is good news although the wound has significantly worsened it in fact is much deeper than it was previous. We have been utilizing up to this point triamcinolone ointment as the prescription topical of choice but at this time I really feel like that the wound is getting need to be packed in order to appropriately manage this due to the deeper nature of the wound. Therefore something along the lines of an alginate dressing may be more appropriate. 05/31/2019 upon inspection today patient's wound actually showed signs of doing poorly at this point. Unfortunately he just does not seem to be making any good progress despite what we have tried. He actually did go ahead and pick up the Cipro and start taking that as he was noticing more green drainage he had previously completed the Levaquin that I prescribed for him as well. Nonetheless he missed his appointment for the seventh last week on Wednesday with the wound care center and [facility] where his dermatologist referred him. Obviously I do think a second opinion would be helpful at this point especially in light of the fact that the patient seems to be doing so poorly despite the fact that we have tried everything that I really know how at this point. The only thing that ever seems to have helped him in the past is when he was on high doses of continual steroids that did seem to make a difference for him. 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 06/07/2019 upon  evaluation today patient unfortunately appears to be doing worse yet again with regard to his wound. In fact I am starting to question whether or not he may have a fluid pocket in the muscle at this point based on the bulging and the soft appearance to the central portion of the muscle area. There is not anything draining from the muscle itself at this time which is good news but nonetheless the wound is expanding. I am not really seeing any results of the Humira as far as overall wound progression based on what I am seeing at this point. The patient has been referred for second opinion with regard to his wound to the [facility] wound care center by his dermatologist which I definitely am not in opposition to. Unfortunately we tried multiple dressings in  the past including collagen, alginate, and at one point even [facility]. With that being said he is never really used it for any significant amount of time due to the fact that he often complains of pain associated with these dressings and then will go back to either using the Santyl which she has done intermittently or more frequently the triamcinolone. He is also using his own compression stockings. We have wrapped him in the past but again that was something else that he really was not a big fan of. 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 He seemed to do much better when he was on the steroids although honestly I understand that the steroids are not necessarily the best medication to be on long-term obviously Custer, Yi ([id number]) Objective Constitutional Well-nourished and well-hydrated in no acute distress. Vitals Time  Taken: 8:10 AM, Height: 71 in, Weight: 338 lbs, BMI: 47.1, Temperature: 98.5 F, Pulse: 72 bpm, Respiratory Rate: 16 breaths/min, Blood Pressure: 134/86 mmHg. Respiratory normal breathing without difficulty. Psychiatric this patient is able to make decisions and demonstrates good insight into disease process. Alert and Oriented x 3. pleasant and cooperative. General Notes: Patient's wound bed currently showed signs of worsening I am somewhat concerned about this obviously. Fortunately there is no evidence of active infection which is good news unfortunately the patient nonetheless still continues to have worsening of the wound as far as size and overall appearance and again I am starting to question whether or not he may have a fluid pocket under the muscle based on evaluation today. We previously performed an ultrasound that did not reveal anything but again that could have changed in the interim since that time till now. Integumentary (Hair, Skin) Wound #1 status is Open. Original cause of wound was Gradually Appeared. The wound is located on the Left,Lateral Lower Leg. The wound measures 5.5cm length x 5.7cm width x 0.8cm depth; 24.622cm^2 area and 19.698cm^3 volume. There is muscle and Fat Layer (Subcutaneous Tissue) Exposed exposed. There is no tunneling or undermining noted. There is a medium amount of serous drainage noted. The wound margin is distinct with the outline attached to the wound base. There is medium (34-66%) pink granulation within the wound bed. There is a medium (34-66%) amount of necrotic tissue within the wound bed including Adherent Slough. 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 Anesthetic (add to Medication List): JOVONI, BORKENHAGEN ([id number]) Wound #1 Left,Lateral Lower Leg: Topical Lidocaine 4% cream applied to wound bed prior to debridement (In Clinic Only). Skin Barriers/Peri-Wound Care: Wound #1 Left,Lateral Lower Leg: Triamcinolone Acetonide Ointment (TCA) Primary Wound Dressing: Dry Gauze Secondary Dressing: Wound #1 Left,Lateral Lower Leg: Boardered Foam Dressing Dressing Change Frequency: Wound #1 Left,Lateral Lower Leg: Change dressing every other day. Follow-up Appointments: Wound #1 Left,Lateral Lower Leg: Return Appointment in 1 week. Edema Control: Wound #1 Left,Lateral Lower Leg: Elevate legs to the level of the heart and pump ankles as often as possible 1. At  this point I am becoming increasingly concerned about the fact that the wound is headed in the wrong direction. He is good to be seeing the wound care center at [facility] this coming Wednesday. That is in just 2 days. For that reason I am definitely interested to see what they have to say with regard to this. Obviously most of the things we have tried to be the cause discomfort of the patient has not been pleased with. Nonetheless I feel like that he really is good and needs some kind of compression and likely something for absorption such as alginate or otherwise in order to help things along but we will see what they have to say. 2. I am also concerned about the fact that the patient appears to potentially have fluid collecting underneath the muscle as best I can tell. I am concerned that this may need to be further evaluated either with ultrasound or some other modality in order to evaluate this. With that being said again since the patient is seen in the [facility] wound care center on Wednesday for second opinion we will see what they have to say. Depending on their plan we may be passing off care of him for some time in order to give them a chance to see if and get things had on the right direction. We will see what they have to say  however. 3. I still am unsure as to whether or not the Humira seems to be making a difference for him or not. With that being said I am definitely not at the point of writing it off though I do believe when he is been on the prednisone in the past he did significantly better. We will see patient back for reevaluation in 1 week here in the clinic. If anything worsens or changes patient will contact our office for additional recommendations. Electronic Signature(s) Signed: 06/07/2019 1:22:06 PM By: [name] Keeler PA-C Entered By: [name] on 06/07/2019 13:22:05 [name] ([id number]) -------------------------------------------------------------------------------- SuperBill Details Patient Name: [name] Date of Service: 06/07/2019 Medical Record Number: [id number] Patient Account Number: [id number] Date of Birth/Sex: Feb 22, 1981 (39 y.o. M) Treating RN: [name] Primary Care Provider: Alma [name] Other Clinician: Referring Provider: Alma [name] Treating Provider/Extender: [name], Sandrika Schwinn [name] in Treatment: 130 Diagnosis Coding ICD-10 Codes Code Description [id number] Non-pressure chronic ulcer of left calf with fat layer exposed L88 Pyoderma gangrenosum I87.2 Venous insufficiency (chronic) (peripheral) L03.116 Cellulitis of left lower limb Facility Procedures CPT4 Code: [id number] Description: [id number] - WOUND CARE VISIT-LEV 2 EST PT Modifier: Quantity: 1 Physician Procedures CPT4 Code: [id number] Description: [id number] - WC PHYS LEVEL 3 - EST PT ICD-10 Diagnosis Description L97.222 Non-pressure chronic ulcer of left calf with fat layer expo L88 Pyoderma gangrenosum I87.2 Venous insufficiency (chronic) (peripheral) L03.116 Cellulitis of left lower  limb Modifier: sed Quantity: 1 Electronic Signature(s) Signed: 06/07/2019 1:23:24 PM By: [name] Keeler PA-C Entered By: [name] on 06/07/2019 13:23:23

## 2019-06-09 ENCOUNTER — Ambulatory Visit
Admit: 2019-06-09 | Discharge: 2019-06-10 | Payer: PRIVATE HEALTH INSURANCE | Attending: Dermatology | Primary: Dermatology

## 2019-06-09 NOTE — Unmapped (Addendum)
It was nice to see you today! Your resident doctor was Dr. Harrison Mons.    If any of your medications are too expensive, look for a coupon at [email or web address].  - Enter the medication name, size, and your zip code to find coupons for local pharmacies.   - Print a coupon and bring it to the pharmacy, or pull up the coupon on a smartphone.  - You can also call pharmacies to ask about the cost of your medication before you pick it up.  If you still cannot afford your medication, please let us know.    Please call our clinic at [phone] with any concerns. We look forward to seeing you again!    Patient Education        infliximab  Pronunciation:  in FLIX ih mab  Brand:  Avsola, Inflectra, Remicade, Renflexis  What is the most important information I should know about infliximab?  Using infliximab may increase your risk of developing certain types of cancer, including a rare fast-growing type of lymphoma that can be fatal. Ask your doctor about your specific risk.  Infliximab affects your immune system. You may get infections more easily, even serious or fatal infections. Before you start using infliximab, your doctor may perform tests to make sure you do not have certain infections.  Call your doctor if you have a fever, tiredness, flu symptoms, cough, or skin sores.  What is infliximab?  Infliximab is used to treat rheumatoid arthritis, psoriatic arthritis, ankylosing spondylitis, and severe or disabling plaque psoriasis in adults.  Infliximab is also used to treat ulcerative colitis or Crohn's disease in adults and children at least 31 years old.  Infliximab is often used when other medicines have not been effective.  Infliximab may also be used for purposes not listed in this medication guide.  What should I discuss with my healthcare provider before receiving infliximab?  You should not be treated with infliximab if you are allergic to it.  Tell your doctor if you have ever had tuberculosis (TB) or if anyone in your household has tuberculosis. Also tell your doctor if you have recently traveled. Tuberculosis and some fungal infections are more common in certain parts of the world, and you may have been exposed during travel.  Tell your doctor if you have ever had:  ?? an active infection (fever, cough, flu symptoms, open sores or skin wounds);  ?? heart failure or other heart problems;  ?? diabetes;  ?? a weak immune system;  ?? liver failure, hepatitis B, or other liver problems;  ?? chronic obstructive pulmonary disease (COPD);  ?? heart problems;  ?? cancer;  ?? seizures;  ?? numbness or tingling anywhere in your body;  ?? a nerve-muscle disorder, such as multiple sclerosis or Guillain-Barr?? syndrome;  ?? phototherapy for psoriasis;  ?? vaccination with BCG (Bacille Calmette-Gu??rin); or  ?? if you are scheduled to receive any vaccines.  Make sure your child is current on all vaccines before he or she starts treatment with infliximab.  Infliximab may cause a rare type of lymphoma (cancer) of the liver, spleen, and bone marrow that can be fatal. This has occurred mainly in teenagers and young men with Crohn's disease or ulcerative colitis. However, anyone with an inflammatory autoimmune disorder may have a higher risk of lymphoma. Talk with your doctor about your own risk.  Infliximab may cause other types of cancer, such as skin cancer or cancer of the cervix. Ask your doctor about this risk.  If you  use infliximab while you are pregnant, make sure any doctor caring for your new baby knows that you used the medicine during pregnancy. Being exposed to infliximab in the womb could affect your baby's vaccination schedule during the first 6 months of life.  You should not breastfeed while you are receiving infliximab.  Infliximab is not for use in children younger than 76 years old.  How is infliximab given?  Before you start treatment with infliximab, your doctor may perform tests to make sure you do not have tuberculosis or other infections. Infliximab is given as an infusion into a vein. A healthcare provider will give you this injection.  This medicine must be given slowly, and the infusion can take at least 2 hours to complete.  You may be watched closely after receiving infliximab, to make sure the medicine has not caused any serious side effects.  Infliximab affects your immune system. You may get infections more easily, even serious or fatal infections. Your doctor will need to examine you on a regular basis, and you may need frequent TB tests.  Serious infections may be more likely in older adults.  If you need surgery, tell the surgeon ahead of time that you are using infliximab.  If you've ever had hepatitis B, using infliximab can cause this virus to become active or get worse. You may need frequent liver function tests while using this medicine and for several months after you stop.  What happens if I miss a dose?  Call your doctor for instructions if you miss an appointment for your infliximab injection.  What happens if I overdose?  Seek emergency medical attention or call the Poison Help line at [phone].  What should I avoid while receiving infliximab?  Avoid activities that may increase your risk of bleeding injury.  Do not receive a live vaccine while using infliximab, or you could develop a serious infection. Live vaccines include measles, mumps, rubella (MMR), polio, rotavirus, typhoid, yellow fever, varicella (chickenpox), and zoster (shingles).  What are the possible side effects of infliximab?  Get emergency medical help if you have signs of an allergic reaction: hives; chest pain, difficult breathing; fever, chills, severe dizziness; swelling of your face, lips, tongue, or throat.  Some side effects may occur during the injection. Tell your caregiver if you feel dizzy, nauseated, light-headed, itchy or tingly, short of breath, or have a headache, fever, chills, muscle or joint pain, pain or tightness in your throat, chest pain, or trouble swallowing during the injection.  Infusion reactions may also occur within 1 or 2 hours after injection.  Serious and sometimes fatal infections may occur during treatment with infliximab. Call your doctor right away if you have signs of infection such as: fever, extreme tiredness, flu symptoms, cough, or skin symptoms (pain, warmth, or redness).  Also call your doctor if you have:  ?? skin changes, new growths on the skin;  ?? pale skin, easy bruising or bleeding;  ?? delayed allergic reaction (up to 12 days after receiving infliximab) --fever, sore throat, trouble swallowing, headache, joint or muscle pain, skin rash, or swelling in your face or hands;  ?? liver problems --right-sided upper stomach pain, loss of appetite, yellowing of your skin or eyes, and not feeling well;  ?? lupus-like syndrome --joint pain or swelling, chest discomfort, feeling short of breath, skin rash on your cheeks or arms (worsens in sunlight);  ?? nerve problems --numbness or tingling, problems with vision, weakness in your arms or legs, seizure;  ??  new or worsening psoriasis --skin redness or scaly patches, raised bumps filled with pus;  ?? signs of heart failure --shortness of breath with swelling of your ankles or feet, rapid weight gain;  ?? signs of a stroke --sudden numbness or weakness, trouble speaking or understanding what is said to you, problems with vision or balance, severe headache;  ?? signs of lymphoma --fever, night sweats, weight loss, stomach pain or swelling, chest pain, cough, trouble breathing, swollen glands (in your neck, armpits, or groin); or  ?? signs of tuberculosis --fever, cough, night sweats, loss of appetite, weight loss, feeling constantly tired.  Serious infections may be more likely in adults who are 65 years or older.  Common side effects may include:  ?? stuffy nose, sinus pain;  ?? fever, chills, sore throat;  ?? cough, chest pain, shortness of breath;  ?? high or low blood pressure;  ?? headache, feeling light-headed;  ?? rash, itching; or  ?? stomach pain.  This is not a complete list of side effects and others may occur. Call your doctor for medical advice about side effects. You may report side effects to FDA at 1-800-FDA-1088.  What other drugs will affect infliximab?  Tell your doctor about all your other medicines, especially:  ?? abatacept;  ?? anakinra;  ?? tocilizumab;  ?? any biologic medications to treat your condition--adalimumab, certolizumab, etanercept, golimumab, natalizumab, rituximab, and others; or  ?? any other medicines to treat Crohn's disease, ulcerative colitis, rheumatoid arthritis, ankylosing spondylitis, psoriatic arthritis, or psoriasis.  This list is not complete. Other drugs may affect infliximab, including prescription and over-the-counter medicines, vitamins, and herbal products. Not all possible drug interactions are listed here.  Where can I get more information?  Your pharmacist can provide more information about infliximab.  Remember, keep this and all other medicines out of the reach of children, never share your medicines with others, and use this medication only for the indication prescribed.   Every effort has been made to ensure that the information provided by [employer] ('[employer]') is accurate, up-to-date, and complete, but no guarantee is made to that effect. Drug information contained herein may be time sensitive. [employer] information has been compiled for use by healthcare practitioners and consumers in the [place] and therefore [employer] does not warrant that uses outside of the [place] are appropriate, unless specifically indicated otherwise. [employer]'s drug information does not endorse drugs, diagnose patients or recommend therapy. [employer]'s drug information is an [employer] corporate to assist licensed healthcare practitioners in caring for their patients and/or to serve consumers viewing this service as a supplement to, and not a substitute for, the expertise, skill, knowledge and judgment of healthcare practitioners. The absence of a warning for a given drug or drug combination in no way should be construed to indicate that the drug or drug combination is safe, effective or appropriate for any given patient. [employer] does not assume any responsibility for any aspect of healthcare administered with the aid of information [employer] provides. The information contained herein is not intended to cover all possible uses, directions, precautions, warnings, drug interactions, allergic reactions, or adverse effects. If you have questions about the drugs you are taking, check with your doctor, nurse or pharmacist.  Copyright [phone] [employer] Version: 20.02. Revision date: 12/11/2018.  Care instructions adapted under license by [facility] If you have questions about a medical condition or this instruction, always ask your healthcare professional. [employer] disclaims any warranty or liability for your  use of this information.

## 2019-06-09 NOTE — Unmapped (Signed)
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     Chronic illness with exacerbation     - discussed that there is some objective improvement today compared to prior photos with less exudate and healthier-appearing granulation tissue at base  - given some but overall insufficient improvement, discussed possibility of increasing humira dosing (previously insurance had denied 80mg  QOW dosing) vs switching to Remicade for better efficacy and potential for weight-based dosing. Risks and benefits discussed.   -patient amenable to plan to start infliximab 10mg /kg week 0, 2, 6, then q 8 weeks. Discussed risks of tuberculosis reactivation, other uncommon infections, theoretical risk of malignancies, and other uncommon side effects.   - continue humira until starting infliximab  - okay to continue PO doxycycline 100mg  BID in the meantime. Discussed that some patients stay on low/sub-antimicrobial doses of doxycyline (50mg  BID) chronically for anti-inflammatory effect.  - will send referral to vascular surgery for recommendations on management of concurrent venous disease, appreciate their assistance. He has seen a vascular specialist in the past but think it would be worth reevaluation. Home nursing may also be beneficial for closer management. May also be helpful to upgrade to medical compression for lower extremities.  - trental may be an option in the future but will await vascular consultation  - also discussed that medications like gabapentin may be an option to help with nerve pain. He will think about this and let us know.    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 high glucose (c/w diabetes); renal and hepatic function appropriate.  - hepatitis panel (HBV cAb / sAb and HCV) negative 02/2017 -- HBV non-immune    Problem: 1 or more chronic illnesses with exacerbation, progression, or side effects of treatment (moderate)   Data: (Moderate - any combination of 3 from the following) Review of prior external note(s) from each unique source,Review of the result(s) of each unique test, Ordering of each unique test, Assessment requiring an independent historian(s)   Management: Prescription drug management (moderate)       Return in about 2 months (around 08/07/2019) for [facility]. or sooner as needed    CC:   Chief Complaint   Patient presents with   ??? Skin Check     pt c/o ulcer in left leg, pt states that he has had it for the last 2 years         HPI:  39 y.o. year old male seen today by Venia Carbon, MD at the request of Lolita Cram MD for evaluation.     Concerns include:    Ulcer on lateral aspect of L leg. Present for years. Has been on many treatments in the past, with predominance of prednisone (from which he has secondary effects including diabetes). Currently on humira, started in Dec. Tolerating well but not sure if helping. Also just finished 14-day taper of prednisone. This did help symptoms some. Seeing [facility] weekly. Applying triamcinolone to wound base daily. Wearing OTC compression stockings, not sure if there  is a numbered compression a/w these. Also on doxycyline, which he has been on for many months. (nots indicate he has been on tetracyclines for many years overall.)     Does have associated nerve pain with shooting pain and burning in the affected foot and leg.    Otherwise: had COVID in November.On metformin and insulin for steroid-induced diabetes.   UC has been quiet/well controlled for the last few months. Has seen vascular specialist in the past but has been at least a year. Does recall hearing about venous disease.    No other new or changing lesions or areas of concern.     Disease history:  -Starting about 2013, he started to develop itchy bumps that enlarge when he scratches and picks at them. This initially affected just his lower legs, but more recently prior to initial presentation to our clinic, involvement spread to his thighs, buttocks, arms, and forearms.  -Areas start as tiny bumps that he then scratches. 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   -In spring 2018, an ulcer started on the L lateral calf and gradually enlarged, but was slowly healing and shrinking by the time of presentation to our clinic. Denies any trauma at this site and notes that it started similar to other rashes on skin. Prior treatments include oral abx, including as recently as 12/2016 with courses of clinda and doxy, as well as topical steroids. He has h/o ulcerative colitis, now in remission without meds since 2015. Denies h/o liver or kidney disease or DM. Used to live in [place] when this started. Works for [employer] and is on feet all day, endorses swelling of legs. Denies new meds prior to onset.  - At his initial visit 01/22/2017, clobetasol ointment and doxycycline 100mg  bid x 4 weeks were started. A biopsy of a keratotic pruritic plaque was obtained. 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 Tissue culture grew 4+ MSSA. 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   - w/up for underlying pruritus (given patient report and difficulty not picking at the involved areas) on 10/17 revealed unremarkable HIV, hepatitis panel, LDH, TSH, free T4 . Outside slides from previous bx were reviewed by Dr. Eyvonne Left and showed venous stasis changes as well as a neutrophilic infiltrate that could be consistent with PG. He was started on topical clobetasol BID. He was continued on PO doxycycline for additional 6 weeks. His left leg ulcer continued to grow and the site of his previous biopsy on the posterior right leg has also developed a large non healing ulcer.   -10/18: w/up for underlying etiology of PG negative on 10/18: negative spep/upep, cbc w/diff consistently normal, and reports a known hx of ulcerative colitis which is in remssion  - Repeat biopsy as below done on 03/26/17 primarily c/w PG vs infectious with one potential organism on Fite stain but unclear if related.   - Patient started on prednisone on 04/01/17 with almost resolution by 7/19.  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 Stopped doxepin and gabapentin given lack of improvement  -rx sent for humira  02/2019 given worsening. Process started  for renflexis in event of failure    Pertinent PMH: No history of skin cancer   Ulcerative colitis, managed by [facility]  Join pain managed by [facility]  HTN  DM   Denies h/o liver, kidney abnormalities   Patient Active Problem List   Diagnosis   ??? Depression   ??? Hyperlipidemia   ??? Lower extremity edema   ??? Seasonal allergic rhinitis   ??? Skin ulcer of left calf with fat layer exposed (CMS-HCC)   ??? Pyoderma gangrenosum   ??? History of ulcerative colitis   ??? High risk medication use   ??? Ulcerative colitis (CMS-HCC)       FH: No family history of melanoma or chronic skin disease     SH: Lives in [facility] [place] [address]    ROS: Baseline state of health. 10 point ROS conducted and negative except as noted in HPI.    PE:  General: Well-developed, well-nourished, in no acute distress  Neuro: Alert and oriented, interacts appropriately  Mouth: not examined as patient wearing a mask  Ext: patient deferred examination  Skin: Per patient request, focused examination with inspection and palpation of the right upper extremity, left upper extremity, right lower extremity, left lower extremity, was performed and notable for the following. All other areas examined were normal or had no significant findings.  - ulceration of lateral left lower extremity measuring 6cm x 7cm x 0.7cm deep, to at least subcutaneous fat, with violaceous rolled borders and surrounding violaceous area w/induration. No exudate.   - scaling and erythema with orange-brown discoloration on both distal LE in the setting of LE edema

## 2019-06-14 ENCOUNTER — Other Ambulatory Visit: Payer: Self-pay

## 2019-06-14 ENCOUNTER — Encounter: Payer: 59 | Admitting: Physician Assistant

## 2019-06-14 ENCOUNTER — Other Ambulatory Visit: Payer: Self-pay | Admitting: Physician Assistant

## 2019-06-14 DIAGNOSIS — S81802A Unspecified open wound, left lower leg, initial encounter: Secondary | ICD-10-CM

## 2019-06-14 DIAGNOSIS — L97222 Non-pressure chronic ulcer of left calf with fat layer exposed: Secondary | ICD-10-CM | POA: Diagnosis not present

## 2019-06-14 NOTE — Unmapped (Signed)
Answers for HPI/ROS submitted by the patient on 06/15/2019   Lower extremity pain  Incident occurred: more than 1 week ago  Incident location: other  Injury mechanism: other  Pain location: left leg  Pain - numeric: 4/10  tingling: Yes  loss of motion: Yes  loss of sensation: Yes        [facility]      Patient Name: Andrew Cameron  Encounter Date: 06/15/2019  Referring provider: Elder Cameron    ASSESSMENT     39 y.o. 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 Given his previous imaging and physical exam, his LLE edema and skin changes are likely due to inflammatory processes associated with his chronic ulcer, and his BLE edema is likely secondary to his weight, relative sedentary lifestyle and sleep apnea.  Though he may have some component of venous insufficiency underlying, no vascular intervention is indicated as venous insufficiency is not the main source of his edema and treatment would offer no significant benefit.      PLAN     The pathophysiology as well as relevant anatomy of venous insufficiency was reviewed in detail with patient.  The importance of compression, leg elevation and skin hygiene was reviewed.    1. Recommended compression and elevation of bilateral at 20-30 mmHg for edema.   2. Continue to follow with [facility] for wound care.  3. Follow up as needed.     HISTORY OF PRESENT ILLNESS     Andrew Cameron is a 39 y.o. yo male with history of steroid-induced diabetes, ulcerative colitis (in remission), and OSA who presents complaining of left lower extremity chronic ulcer in the setting of pyoderma gangrenosum. Patient initially developed an ulcer on his lateral left calf in July 2018 without inciting trauma, with patient noting he had had similar rashes previously. Since then, the ulcer has worsened over time, and is currently down to the tendon. He denies drainage from the ulcer recently. He also notes shooting, burning pain in his left leg and foot. Patient has previously tried humira without significant improvement, as well as 14 day taper of prednisone, which improved symptoms. Patient follows with Dr. Leonard Cameron at [facility] wound care weekly and has been on doxycycline for multiple months. He wears OTC compression stockings regularly, which he reports moderately help his edema.     Patient underwent work-up with dermatology, who found a previous biopsy from OSH concerning for pyoderma gangrenosum. Patient has been referred to vascular to assess for venous insufficiency contributing to his edema, given he has a family history of venous insufficiency; he has previously obtained a LLE venous ultrasound with [facility] in 2018, where he was told he had bad valves.  However, he was told that there was no indication for any intervention at that time.      ROS: The balance of 10 systems reviewed is negative except as noted in the HPI     RX/ ALLERGIES/ MED HX/SURG HX/ SOC HX/FAM HX: Reviewed & updated in Epic    Current Outpatient Medications   Medication Instructions   ??? acetaminophen (TYLENOL) 1,000 mg, Oral   ??? adalimumab (HUMIRA) 40 mg/0.8 mL subcutaneous pen kit Inject the contents of 1 pen (40 mg total) under the skin every fourteen (14) days.   ??? cetirizine (ZYRTEC) 10 mg, Oral   ??? doxycycline (VIBRA-TABS) 100 mg, Oral, 2 times a day (standard)   ??? fluticasone (FLONASE) 50  mcg/actuation nasal spray No dose, route, or frequency recorded.   ??? hydrocortisone 2.5 % cream 1 application, Topical, 2 times a day (standard), To face for itch   ??? ibuprofen (ADVIL,MOTRIN) 400-600 mg, Oral   ??? LANTUS SOLOSTAR U-100 INSULIN 10 Units, Subcutaneous, Nightly   ??? metFORMIN (GLUCOPHAGE) 1,000 mg, Oral, 2 times a day   ??? mupirocin (BACTROBAN) 2 % ointment Topical, 2 times a day (standard), Mix with clobetasol and apply twice daily   ??? predniSONE (DELTASONE) 10 MG tablet 4 x 5 days, 3 x 5 days, 2 x 5 days, 1 x 5 days, then off   ??? predniSONE (DELTASONE) 10 MG tablet 4 x 5 days, 3 x 5 days, 2 x 5 days, 1 x 5 days, then off   ??? triamcinolone (KENALOG) 0.1 % cream Topical, 2 times a day (standard), Apply twice daily to scaly spots on legs.   ??? venlafaxine (EFFEXOR-XR) 150 mg, Oral        PHYSICAL EXAM     Vitals:    06/15/19 1441   BP: 136/72   Pulse: 77   Temp: 36.5 ??C (97.7 ??F)     General: well appearing, no acute distress, morbidly obese  Head: normocephalic, atraumatic, sclera non-icteric  Neck: Supple  Cardiac: RRR, no appreciable murmur  Lungs: Easy work of breathing, CTA Bilaterally  Abdomen: Soft, non-distended, non tender, without mass or surgical scars.  Body habitus limits the accuracy of this exam.  MSK: negative joint tenderness, normal gait and ROM  Extremities: Upper extremities warm and perfused. Bilateral lower extremities have significant edema.  No varicosities evident.  Arterial Vascular: Bilateral radial pulses palpable. DP and PT signal on doppler bilaterally.   Skin: Left lower extremity hyperpigmentation consistent with hemosiderin deposition. Open, full thickness wound to left lateral calf, with minimal serous sanguineous drainage. See image below.   Neuro: AAO x 3, appropriate to questions    Media Information       Document Information    Other:  Other Patient   Left leg    06/15/2019 14:53   Attached To:   Andrew Cameron   Source Information    Francie Massing, RN - [facility]         DIAGNOSTICS     Images and reports reviewed independently.      ______________________________________________________________________    Documentation assistance was provided by Marrianne Mood, Scribe, on June 14, 2019 at 12:41 PM for Galen Daft, [place], and Mitchel Honour, MD    ---  June 16, 2019 8:20 AM. Documentation assistance provided by the Scribe. I was present during the time the encounter was recorded. The information recorded by the Scribe was done at my direction and has been reviewed and validated by me.  ----------------------------------------------------------------------------------------------------------------------

## 2019-06-14 NOTE — Progress Notes (Addendum)
[name], [name] ([id number]) Visit Report for 06/14/2019 Arrival Information Details Patient Name: [name], [name] Date of Service: 06/14/2019 8:15 AM Medical Record Number: [id number] Patient Account Number: [id number] Date of Birth/Sex: 27-Apr-1981 (39 y.o. M) Treating RN: Army Melia Primary Care Momina Hunton: [name] Other Clinician: Referring Vaness Jelinski: [name] Treating Brennden Masten/Extender: Melburn Hake, [name]: 131 Visit Information History Since Last Visit Added or deleted any medications: No Patient Arrived: Ambulatory Any new allergies or adverse reactions: No Arrival Time: 08:26 Had a fall or experienced change in No Accompanied By: self activities of daily living that may affect Transfer Assistance: None risk of falls: Patient Identification Verified: Yes Signs or symptoms of abuse/neglect since last visito No Patient Requires Transmission-Based No Hospitalized since last visit: No Precautions: Has Dressing in Place as Prescribed: Yes Patient Has Alerts: No Pain Present Now: Yes Electronic Signature(s) Signed: 06/14/2019 12:59:59 PM By: Army Melia Entered By: Army Melia on 06/14/2019 08:26:32 [name] ([id number]) -------------------------------------------------------------------------------- Clinic Level of Care Assessment Details Patient Name: [name] Date of Service: 06/14/2019 8:15 AM Medical Record Number: [id number] Patient Account Number: [id number] Date of Birth/Sex: 1980/10/13 (39 y.o. M) Treating RN: [name] Primary Care Joycelin Radloff: [name] Other Clinician: Referring Clancey Welton: [name] Treating Findley Vi/Extender: Melburn Hake, [name]: 131 Clinic Level of Care Assessment Items TOOL 4 Quantity Score []  - Use when only an EandM is performed on FOLLOW-UP visit 0 ASSESSMENTS - Nursing Assessment / Reassessment X - Reassessment of Co-morbidities (includes updates in patient status) 1 10 X- 1  5 Reassessment of Adherence to Treatment Plan ASSESSMENTS - Wound and Skin Assessment / Reassessment X - Simple Wound Assessment / Reassessment - one wound 1 5 []  - 0 Complex Wound Assessment / Reassessment - multiple wounds []  - 0 Dermatologic / Skin Assessment (not related to wound area) ASSESSMENTS - Focused Assessment []  - Circumferential Edema Measurements - multi extremities 0 []  - 0 Nutritional Assessment / Counseling / Intervention []  - 0 Lower Extremity Assessment (monofilament, tuning fork, pulses) []  - 0 Peripheral Arterial Disease Assessment (using hand held doppler) ASSESSMENTS - Ostomy and/or Continence Assessment and Care []  - Incontinence Assessment and Management 0 []  - 0 Ostomy Care Assessment and Management (repouching, etc.) PROCESS - Coordination of Care X - Simple Patient / Family Education for ongoing care 1 15 []  - 0 Complex (extensive) Patient / Family Education for ongoing care X- 1 10 Staff obtains Programmer, systems, Records, Test Results / Process Orders []  - 0 Staff telephones HHA, Nursing Homes / Clarify orders / etc []  - 0 Routine Transfer to another Facility (non-emergent condition) []  - 0 Routine Hospital Admission (non-emergent condition) []  - 0 New Admissions / Biomedical engineer / Ordering NPWT, Apligraf, etc. []  - 0 Emergency Hospital Admission (emergent condition) X- 1 10 Simple Discharge Coordination Jozwiak, Treyvone ([id number]) []  - 0 Complex (extensive) Discharge Coordination PROCESS - Special Needs []  - Pediatric / Minor Patient Management 0 []  - 0 Isolation Patient Management []  - 0 Hearing / Language / Visual special needs []  - 0 Assessment of Community assistance (transportation, D/C planning, etc.) []  - 0 Additional assistance / Altered mentation []  - 0 Support Surface(s) Assessment (bed, cushion, seat, etc.) INTERVENTIONS - Wound Cleansing / Measurement X - Simple Wound Cleansing - one wound 1 5 []  - 0 Complex Wound  Cleansing - multiple wounds X- 1 5 Wound Imaging (photographs - any number of wounds) []  - 0 Wound Tracing (instead of photographs) X- 1 5 Simple Wound Measurement -  one wound []  - 0 Complex Wound Measurement - multiple wounds INTERVENTIONS - Wound Dressings []  - Small Wound Dressing one or multiple wounds 0 X- 1 15 Medium Wound Dressing one or multiple wounds []  - 0 Large Wound Dressing one or multiple wounds []  - 0 Application of Medications - topical []  - 0 Application of Medications - injection INTERVENTIONS - Miscellaneous []  - External ear exam 0 []  - 0 Specimen Collection (cultures, biopsies, blood, body fluids, etc.) []  - 0 Specimen(s) / Culture(s) sent or taken to Lab for analysis []  - 0 Patient Transfer (multiple staff / [name] / Similar devices) []  - 0 Simple Staple / Suture removal (25 or less) []  - 0 Complex Staple / Suture removal (26 or more) []  - 0 Hypo / Hyperglycemic Management (close monitor of Blood Glucose) []  - 0 Ankle / Brachial Index (ABI) - do not check if billed separately X- 1 5 Vital Signs [name], [name] ([id number]) Has the patient been seen at the hospital within the last three years: Yes Total Score: 90 Level Of Care: New/Established - Level 3 Electronic Signature(s) Signed: 06/14/2019 4:35:47 PM By: [name], [name], RN, [name], [name], [name] Entered By: [name], [name], RN, [name], [name] on 06/14/2019 09:00:27 [name] ([id number]) -------------------------------------------------------------------------------- Encounter Discharge Information Details Patient Name: [name] Date of Service: 06/14/2019 8:15 AM Medical Record Number: [id number] Patient Account Number: [id number] Date of Birth/Sex: 1980-10-19 (39 y.o. M) Treating RN: [name] Primary Care Micki Cassel: [name] Other Clinician: Referring Frazier Balfour: [name] Treating Aleigha Gilani/Extender: Melburn Hake, [name]: 131 Encounter Discharge Information  Items Discharge Condition: Stable Ambulatory Status: Ambulatory Discharge Destination: Home Transportation: Private Auto Accompanied By: self Schedule Follow-up Appointment: Yes Clinical Summary of Care: Electronic Signature(s) Signed: 06/14/2019 4:35:47 PM By: [name], [name], RN, [name], [name], [name] Entered By: [name], [name], RN, [name], [name] on 06/14/2019 09:01:28 [name] ([id number]) -------------------------------------------------------------------------------- Lower Extremity Assessment Details Patient Name: [name] Date of Service: 06/14/2019 8:15 AM Medical Record Number: [id number] Patient Account Number: [id number] Date of Birth/Sex: 02/11/1981 (39 y.o. M) Treating RN: Army Melia Primary Care Kaziah Krizek: [name] Other Clinician: Referring Katrinia Straker: [name] Treating Lynniah [name]/Extender: Melburn Hake, [name]: 131 Edema Assessment Assessed: [Left: No] [Right: No] Edema: [Left: N] [Right: o] Vascular Assessment Pulses: Dorsalis Pedis Palpable: [Left:Yes] Electronic Signature(s) Signed: 06/14/2019 12:59:59 PM By: Army Melia Entered By: Army Melia on 06/14/2019 08:31:37 [name], [name] ([id number]) -------------------------------------------------------------------------------- Multi Wound Chart Details Patient Name: [name] Date of Service: 06/14/2019 8:15 AM Medical Record Number: [id number] Patient Account Number: [id number] Date of Birth/Sex: [birth year] (39 y.o. M) Treating RN: [name] Primary Care Ariyel Jeangilles: [name] Other Clinician: Referring Ammiel Guiney: [name] Treating Ryer Asato/Extender: Melburn Hake, [name]: 131 Vital Signs Height(in): 71 Pulse(bpm): 77 Weight(lbs): 338 Blood Pressure(mmHg): 143/80 Body Mass Index(BMI): 47 Temperature(F): 98.3 Respiratory Rate 16 (breaths/min): Photos: [N/A:N/A] Wound Location: Left Lower Leg - Lateral N/A N/A Wounding Event: Gradually Appeared N/A  N/A Primary Etiology: Pyoderma N/A N/A Comorbid History: Sleep Apnea, Hypertension, N/A N/A Colitis Date Acquired: 11/18/2015 N/A N/A Weeks of Treatment: 131 N/A N/A Wound Status: Open N/A N/A Measurements L x W x D 5.6x5.7x1 N/A N/A (cm) Area (cm) : 25.07 N/A N/A Volume (cm) : 25.07 N/A N/A % Reduction in Area: -410.70% N/A N/A % Reduction in Volume: -538.40% N/A N/A Classification: Full Thickness With Exposed N/A N/A Support Structures Exudate Amount: Medium N/A N/A Exudate Type: Serous N/A N/A Exudate Color: amber N/A N/A Wound Margin:  Distinct, outline attached N/A N/A Granulation Amount: Medium (34-66%) N/A N/A Granulation Quality: Pink N/A N/A Necrotic Amount: Medium (34-66%) N/A N/A Exposed Structures: Fat Layer (Subcutaneous N/A N/A Tissue) Exposed: Yes Muscle: Yes Fascia: No Tendon: No [name], [name] ([id number]) Joint: No Bone: No Epithelialization: None N/A N/A Treatment Notes Electronic Signature(s) Signed: 06/14/2019 4:35:47 PM By: [name], [name], RN, [name], [name], [name] Entered By: [name], [name], RN, [name], [name] on 06/14/2019 08:55:39 [name] ([id number]) -------------------------------------------------------------------------------- Multi-Disciplinary Care Plan Details Patient Name: [name] Date of Service: 06/14/2019 8:15 AM Medical Record Number: [id number] Patient Account Number: [id number] Date of Birth/Sex: [birth year] (39 y.o. M) Treating RN: [name] Primary Care Redell Bhandari: [name] Other Clinician: Referring Kiaraliz Rafuse: [name] Treating Kyilee Gregg/Extender: Melburn Hake, [name]: 131 Active Inactive Orientation to the Wound Care Program Nursing Diagnoses: Knowledge deficit related to the wound healing center program Goals: Patient/caregiver will verbalize understanding of the [facility] Program Date Initiated: 12/11/2016 Target Resolution Date: 03/13/2017 Goal Status: Active Interventions: Provide education on  orientation to the wound center Notes: Venous Leg Ulcer Nursing Diagnoses: Knowledge deficit related to disease process and management Potential for venous Insuffiency (use before diagnosis confirmed) Goals: Non-invasive venous studies are completed as ordered Date Initiated: 12/11/2016 Target Resolution Date: 03/13/2017 Goal Status: Active Patient will maintain optimal edema control Date Initiated: 12/11/2016 Target Resolution Date: 03/13/2017 Goal Status: Active Patient/caregiver will verbalize understanding of disease process and disease management Date Initiated: 12/11/2016 Target Resolution Date: 03/13/2017 Goal Status: Active Verify adequate tissue perfusion prior to therapeutic compression application Date Initiated: 12/11/2016 Target Resolution Date: 03/13/2017 Goal Status: Active Interventions: Assess peripheral edema status every visit. Compression as ordered Provide education on venous insufficiency Treatment Activities: [name], [name] ([id number]) Therapeutic compression applied : 12/11/2016 Notes: Wound/Skin Impairment Nursing Diagnoses: Impaired tissue integrity Knowledge deficit related to ulceration/compromised skin integrity Goals: Patient/caregiver will verbalize understanding of skin care regimen Date Initiated: 12/11/2016 Target Resolution Date: 03/13/2017 Goal Status: Active Ulcer/skin breakdown will have a volume reduction of 30% by week 4 Date Initiated: 12/11/2016 Target Resolution Date: 03/13/2017 Goal Status: Active Ulcer/skin breakdown will have a volume reduction of 50% by week 8 Date Initiated: 12/11/2016 Target Resolution Date: 03/13/2017 Goal Status: Active Ulcer/skin breakdown will have a volume reduction of 80% by week 12 Date Initiated: 12/11/2016 Target Resolution Date: 03/13/2017 Goal Status: Active Ulcer/skin breakdown will heal within 14 weeks Date Initiated: 12/11/2016 Target Resolution Date: 03/13/2017 Goal Status:  Active Interventions: Assess patient/caregiver ability to obtain necessary supplies Assess patient/caregiver ability to perform ulcer/skin care regimen upon admission and as needed Assess ulceration(s) every visit Provide education on ulcer and skin care Treatment Activities: Skin care regimen initiated : 12/11/2016 Topical wound management initiated : 12/11/2016 Notes: Electronic Signature(s) Signed: 06/14/2019 4:35:47 PM By: [name], [name], RN, [name], [name], [name] Entered By: [name], [name], RN, [name], [name] on 06/14/2019 08:55:31 [name] ([id number]) -------------------------------------------------------------------------------- Pain Assessment Details Patient Name: [name] Date of Service: 06/14/2019 8:15 AM Medical Record Number: [id number] Patient Account Number: [id number] Date of Birth/Sex: October 28, 1980 (39 y.o. M) Treating RN: Army Melia Primary Care Rusty Glodowski: [name] Other Clinician: Referring Dinesha Twiggs: [name] Treating Dearia Wilmouth/Extender: Melburn Hake, [name]: 131 Active Problems Location of Pain Severity and Description of Pain Patient Has Paino Yes Site Locations Pain Location: Pain in Ulcers Rate the pain. Current Pain Level: 4 Pain Management and Medication Current Pain Management: Electronic Signature(s) Signed: 06/14/2019 12:59:59 PM By: Army Melia Entered By: Army Melia on 06/14/2019 08:26:39 [name], [name] (  [id number]) -------------------------------------------------------------------------------- Patient/Caregiver Education Details Patient Name: [name], [name] Date of Service: 06/14/2019 8:15 AM Medical Record Number: [id number] Patient Account Number: [id number] Date of Birth/Gender: 04/30/1981 (39 y.o. M) Treating RN: [name] Primary Care Physician: [name] Other Clinician: Referring Physician: [name] Treating Physician/Extender: Sharalyn Ink in Treatment: 131 Education Assessment Education  Provided To: Patient Education Topics Provided Wound/Skin Impairment: Handouts: Caring for Your Ulcer, Other: continue wound care as prescribed Methods: Demonstration, Explain/Verbal Responses: State content correctly Electronic Signature(s) Signed: 06/14/2019 4:35:47 PM By: [name], [name], RN, [name], [name], [name] Entered By: [name], [name], RN, [name], [name] on 06/14/2019 09:00:57 [name] ([id number]) -------------------------------------------------------------------------------- Wound Assessment Details Patient Name: [name] Date of Service: 06/14/2019 8:15 AM Medical Record Number: [id number] Patient Account Number: [id number] Date of Birth/Sex: [birth year] (39 y.o. M) Treating RN: Army Melia Primary Care Hilary Pundt: [name] Other Clinician: Referring Logon Uttech: [name] Treating [name]/Extender: Melburn Hake, [name]: 131 Wound Status Wound Number: 1 Primary Etiology: Pyoderma Wound Location: Left Lower Leg - Lateral Wound Status: Open Wounding Event: Gradually Appeared Comorbid History: Sleep Apnea, Hypertension, Colitis Date Acquired: 11/18/2015 Weeks Of Treatment: 131 Clustered Wound: No Photos Wound Measurements Length: (cm) 5.6 Width: (cm) 5.7 Depth: (cm) 1 Area: (cm) 25.07 Volume: (cm) 25.07 % Reduction in Area: -410.7% % Reduction in Volume: -538.4% Epithelialization: None Tunneling: No Undermining: No Wound Description Full Thickness With Exposed Support Foul Odor A Classification: Structures Slough/Fibr Wound Margin: Distinct, outline attached Exudate Medium Amount: Exudate Type: Serous Exudate Color: amber fter Cleansing: No ino Yes Wound Bed Granulation Amount: Medium (34-66%) Exposed Structure Granulation Quality: Pink Fascia Exposed: No Necrotic Amount: Medium (34-66%) Fat Layer (Subcutaneous Tissue) Exposed: Yes Necrotic Quality: Adherent Slough Tendon Exposed: No Muscle Exposed: Yes Necrosis of Muscle: No Joint  Exposed: No Bone Exposed: No [name], [name] ([id number]) Treatment Notes Wound #1 (Left, Lateral Lower Leg) Notes TCA, dry gauze, BFD Electronic Signature(s) Signed: 06/14/2019 12:59:59 PM By: Army Melia Entered By: Army Melia on 06/14/2019 [place] ([id number]) -------------------------------------------------------------------------------- Vitals Details Patient Name: [name] Date of Service: 06/14/2019 8:15 AM Medical Record Number: [id number] Patient Account Number: [id number] Date of Birth/Sex: [birth year] (39 y.o. M) Treating RN: Army Melia Primary Care Benno Brensinger: [name] Other Clinician: Referring Trason Shifflet: [name] Treating Lyndon Chapel/Extender: Melburn Hake, [name]: 131 Vital Signs Time Taken: 08:26 Temperature (F): 98.3 Height (in): 71 Pulse (bpm): 77 Weight (lbs): 338 Respiratory Rate (breaths/min): 16 Body Mass Index (BMI): 47.1 Blood Pressure (mmHg): 143/80 Reference Range: 80 - 120 mg / dl Electronic Signature(s) Signed: 06/14/2019 12:59:59 PM By: Army Melia Entered By: Army Melia on 06/14/2019 08:27:20

## 2019-06-14 NOTE — Progress Notes (Addendum)
[name] ([id number]) Visit Report for 06/14/2019 Chief Complaint Document Details Patient Name: [name] Date of Service: 06/14/2019 8:15 AM Medical Record Number: [id number] Patient Account Number: [id number] Date of Birth/Sex: 1981/05/07 (39 y.o. 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 M) Treating RN: [name] Primary Care Provider: [name] Other Clinician: Referring Provider: [name] Treating Provider/Extender: Melburn Hake, Adham Johnson Weeks in Treatment: 131 History of Present Illness HPI Description: 12/04/16; 39 year old man who comes into the clinic today for review of a wound on the posterior left calf. He tells me that is been there for about a year. He is not a diabetic he does smoke half a pack per day. He was seen in the ER on 11/20/16 felt to have cellulitis around the wound and was given clindamycin. An x-ray did not show osteomyelitis. The patient initially tells me that he has a milk allergy that sets off a pruritic itching rash on his lower legs which she scratches incessantly and he thinks that's what may have set up the wound. He has been using various topical antibiotics and ointments without any effect. He works in a [employer] and is on his feet all day. He does not have a prior  history of wounds however he does have the rash on both lower legs the right arm and the ventral aspect of his left arm. These are excoriations and clearly have had scratching however there are of macular looking areas on both legs including a substantial larger area on the right leg. This does not have an underlying open area. There is no blistering. The patient tells me that 2 years ago in [place] in response to the rash on his legs he saw a dermatologist who told him he had a condition which may be pyoderma gangrenosum although I may be putting words into his mouth. He seemed to recognize this. 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 He is not in any treatment for this. He's had no recent travel 12/11/16; the patient arrives today with his wound and roughly the same condition we've been using silver alginate this is a deep punched out wound with some surrounding erythema but no tenderness. Biopsy I did did not show confirmed pyoderma gangrenosum suggested nonspecific inflammation and vasculitis but does not provide an actual description of what was seen by the pathologist. I'm really not able to understand this We have also received information from the patient's dermatologist in [place] notes from April 2016. This was a doctor Agarwal- antal. 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 He was prescribed topical steroid high potency under occlusion which helped but at this point the patient did not have a deep punched out wound. 12/18/16; the patient's wound is larger in terms of surface area however this surface looks better and there is less depth. The surrounding erythema also is better. The patient states that the wrap we put on came off 2 days ago when he has been using  his compression stockings. He we are in the process of getting a dermatology consult. 12/26/16 on evaluation today patient's left lower extremity wound shows evidence of  infection with surrounding erythema noted. He has been tolerating the dressing changes but states that he has noted more discomfort. There is a larger area of erythema surrounding the wound. No fevers, chills, nausea, or vomiting noted at this time. With that being said the wound still does have slough covering the surface. He is not allergic to any medication that he is aware of at this point. In regard to his right lower extremity he had several regions that are erythematous and pruritic he wonders if there's anything we can do to help that. 01/02/17 I reviewed patient's wound culture which was obtained his visit last week. He was placed on doxycycline at that point. Unfortunately that does not appear to be an antibiotic that would likely help with the situation however the pseudomonas noted on culture is sensitive to Cipro. Also unfortunately patient's wound seems to have a large compared to last week's evaluation. Not severely so but there are definitely increased measurements in general. He is continuing to have discomfort as well he writes this to be a seven out of 10. In fact he would prefer me not to perform any debridement today due to the fact that he is having discomfort and considering he has an active infection on the little reluctant to do so anyway. No fevers, chills, nausea, or vomiting noted at this time. 01/08/17; patient seems dermatology on September 5. I suspect dermatology will want the slides from the biopsy I did sent to their pathologist. I'm not sure if there is a way we can expedite that. In any case the culture I did before I left on vacation 3 weeks ago showed Pseudomonas he was given 10 days of Cipro and per her description of her intake nurses is actually somewhat better this week although the wound is quite a bit bigger than I remember the last time I saw this. He still has 3 more days of Cipro [name] ([id number]) 01/21/17; dermatology appointment tomorrow. He has  completed the ciprofloxacin for Pseudomonas. Surface of the wound looks better however he is had some deterioration in the lesions on his right leg. Meantime the left lateral leg wound we will continue with sample 01/29/17; patient had his dermatology appointment but I can't yet see that note. He is completed his antibiotics. The wound is more superficial but considerably larger in circumferential area than when he came in. This is in his left lateral calf. He also has swollen erythematous areas with superficial wounds on the right leg and small papular areas on both arms. There apparently areas in her his upper thighs and buttocks I did not look at those. Dermatology biopsied the right leg. Hopefully will have their input next week. 02/05/17; patient went back to see his dermatologist who told him that he had a "scratching problem" as well as staph. He is now on a 30 day course of doxycycline and I believe she gave him triamcinolone cream to the right leg areas to help with the itching [not exactly sure but probably triamcinolone]. She apparently looked at the left lateral leg wound although this was not rebiopsied and I think felt to be ultimately part of the same pathogenesis. He is using sample border foam and changing nevus himself. He now has a new open area on the right posterior leg which was his biopsy site I don't  have any of the dermatology notes 02/12/17; we put the patient in compression last week with SANTYL to the wound on the left leg and the biopsy. Edema is much better and the depth of the wound is now at level of skin. Area is still the same oBiopsy site on the right lateral leg we've also been using santyl with a border foam dressing and he is changing this himself. 02/19/17; Using silver alginate started last week to both the substantial left leg wound and the biopsy site on the right wound. He is tolerating compression well. Has a an appointment with his primary M.D. tomorrow  wondering about diuretics although I'm wondering if the edema problem is actually lymphedema 02/26/17; the patient has been to see his primary doctor Dr. Jerrel Ivory at [facility] our primary care. She started him on Lasix 20 mg and this seems to have helped with the edema. However we are not making substantial change with the left lateral calf wound and inflammation. The biopsy site on the right leg also looks stable but not really all that different. 03/12/17; the patient has been to see vein and vascular Dr. Lucky Cowboy. He has had venous reflux studies I have not reviewed these. I did get a call from his dermatology office. 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 They therefore recommended clobetasol and tetracycline i.e. 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 03/26/17; apparently the patient just had reflux studies not an appointment with Dr. dew. She arrives in clinic today having applied clobetasol for 2-3 weeks. He notes over the last 2-3 days excessive drainage having to change the dressing 3-4 times a day and also expanding erythema. 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 Lillia Carmel at [facility] dermatology locally who kindly saw him at my request. 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 My concern was the degree of erythema medially extending from his left leg wound which was either inflammation from  pyoderma or cellulitis. I put him on Augmentin however culture of the wound showed Pseudomonas which is quinolone sensitive. I really don't believe he has cellulitis however in view of everything I will continue and give him a course of Cipro. He is also on doxycycline as an immune modulator for the pyoderma. In addition to his original wound on the left lateral leg with surrounding erythema he has a wound on the right posterior calf which was an original biopsy site done by dermatology. 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 Saw Dr. Lillia Carmel yesterday. He has been using topical antibiotics to both wound areas his original wound on the left and the biopsies/pathergy area on the right. There is definitely some improvement in the inflammation around the wound on the right although the patient states he has increasing sensitivity of the wounds. He is on prednisone 60 and doxycycline 1 as prescribed by Dr. Lillia Carmel. He is covering the topical  antibiotic with gauze and putting this in his own compression stocks and changing this daily. He states that Dr. Lottie Rater did a culture of the left leg wound yesterday 05/07/17; pyoderma gangrenosum. The patient saw Dr. Lillia Carmel yesterday and has a follow-up with her in one month. He is still using topical antibiotics to both wounds although he can't recall exactly what type. He is still on prednisone 60 mg. Dr. Lillia Carmel stated that the doxycycline could stop if we were in agreement. 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 The right medial leg was induced by biopsy/pathergy. The area on the right is essentially healed. 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 The right medial leg has closed and  remains closed. He is still on prednisone 60. oHe tells me he missed his last dermatology appointment with Dr. Lillia Carmel but will make another appointment. He reports that her blood sugar at a recent screen in [place] was high 200's. He was 180 today. He is more cushingoid blood pressure is Sperry, Jesse ([id number]) up a bit. I think he is going to require still much longer prednisone perhaps another 3 months before attempting to taper. In the meantime his wound is a lot better. Smaller. He is cleaning this off daily and applying topical antibiotics. When he was last in the clinic I thought about changing to [facility] and actually put in a couple of calls to dermatology although probably not during their business hours. 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 He continues on oral prednisone. He has been using triple antibiotic ointment. There is surface debris and we will transition to [facility] and have him return in 2 weeks. He has lost 30 pounds since his last appointment with lifestyle modification. He may benefit from topical steroid cream for treatment this can be considered at a later date. 08/22/17 on evaluation today patient appears to actually be doing rather well in regard to his left lateral lower extremity ulcer. He has actually been managed by Dr. Dellia Nims most recently. Patient is currently on oral steroids at this time. This seems to have been of benefit for him. Nonetheless his last visit was actually with Leah on 08/06/17. Currently he is not utilizing any topical steroid creams although this could be of benefit as well. No fevers, chills, nausea, or vomiting noted at this time. 09/05/17 on evaluation today patient appears to be doing better in regard to his left lateral lower extremity ulcer. He has been tolerating the dressing changes without complication.  He is using Santyl with good effect. Overall I'm very pleased with how things are standing at this point. Patient likewise is happy that this is doing better. 09/19/17 on evaluation today patient actually appears to be doing rather well in regard to his left lateral lower extremity ulcer. 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 He's not having any significant pain. 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 He has been tolerating the Santyl without complication and in general I feel like he's making good  progress. 10/17/17 on evaluation today patient appears to be doing very well in regard to his left lateral lower surety ulcer. He has been tolerating the dressing changes without complication. There does not appear to be any evidence of infection he's alternating the Santyl and the triple antibiotic ointment every other day this seems to be doing well for him. 11/03/17 on evaluation today patient appears to be doing very well in regard to his left lateral lower extremity ulcer. He is been tolerating the dressing changes without complication which is good news. Fortunately there does not appear to be any evidence of infection which is also great news. Overall is doing excellent they are starting to taper down on the prednisone is down to 40 mg at this point it also started topical clobetasol for him. 11/17/17 on evaluation today patient appears to be doing well in regard to his left lateral lower surety ulcer. He's been tolerating the dressing changes without complication. He does note that he is having no pain, no excessive drainage or discharge, and overall he feels like things are going about how he would expect and hope they would. Overall he seems to have no evidence of infection at this time in my opinion which is  good news. 12/04/17-He is seen in follow-up evaluation for right lateral lower extremity ulcer. He has been applying topical steroid cream. Today's measurement show slight increase in size. Over the next 2 weeks we will transition to every other day Santyl and steroid cream. He has been encouraged to monitor for changes and notify clinic with any concerns 12/15/17 on evaluation today patient's left lateral motion the ulcer and fortunately is doing worse again at this point. This just since last week to this week has close to doubled in size according to the patient. I did not seeing last week's I do not have a visual to compare this to in our system was also down so we do not have all the charts and at this point. Nonetheless it does have me somewhat concerned in regard to the fact that again he was worried enough about it he has contact the dermatology that placed them back on the full strength, 50 mg a day of the prednisone that he was taken previous. He continues to alternate using clobetasol along with Santyl at this point. He is obviously somewhat frustrated. 12/22/17 on evaluation today patient appears to be doing a little worse compared to last evaluation. Unfortunately the wound is a little deeper and slightly larger than the last week's evaluation. With that being said he has made some progress in regard to the irritation surrounding at this time unfortunately despite that progress that's been made he still has a significant issue going on here. 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 For that reason I discussed with him today the possibility of perform a wound culture to ensure there's nothing overtly infected. 01/06/18 on evaluation today patient's wound is larger and deeper than previously evaluated. With that being said it did appear that  his wound was infected after my last evaluation with him. Subsequently I did end up prescribing a prescription for Bactrim DS which she has been taking and having no complication with. Fortunately there does not appear to be any evidence of Matteo, [name] ([id number]) infection at this point in time as far as anything spreading, no want to  touch, and overall I feel like things are showing signs of improvement. 01/13/18 on evaluation today patient appears to be even a little larger and deeper than last time. There still muscle exposed in the base of the wound. Nonetheless he does appear to be less erythematous I do believe inflammation is calming down also believe the infection looks like it's probably resolved at this time based on what I'm seeing. No fevers, chills, nausea, or vomiting noted at this time. 01/30/18 on evaluation today patient actually appears to visually look better for the most part. Unfortunately those visually this looks better he does seem to potentially have what may be an abscess in the muscle that has been noted in the central portion of the wound. This is the first time that I have noted what appears to be fluctuance in the central portion of the muscle. With that being said I'm somewhat more concerned about the fact that this might indicate an abscess formation at this location. I do believe that an ultrasound would be appropriate. This is likely something we need to try to do as soon as possible. He has been switch to mupirocin ointment and he is no longer using the steroid ointment as prescribed by dermatology he sees them again next week he's been decreased from 60 to 40 mg of prednisone. 03/09/18 on evaluation today patient actually appears to be doing a little better compared to last time I saw him. There's not as much erythema surrounding the wound itself. He I did review his most recent infectious disease note which was dated 02/24/18. He saw Dr. Michel Bickers in  [place]. 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 Patient is now off of antibiotics and they are continually observing this. There seems to be no change in the past two weeks in my pinion based on what the patient says and what I see today compared to what Dr. Megan Salon likely saw two weeks ago. No fevers, chills, nausea, or vomiting noted at this time. 03/23/18 on evaluation today patient's wound actually appears to be showing signs of improvement which is good news. He is currently still on the Dapsone. He is also working on tapering the prednisone to get off of this and Dr. Lottie Rater is working with him in this regard. Nonetheless overall I feel like the wound is doing well it does appear based on the infectious disease note that I reviewed from Dr. 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 I did also review the results of his ultrasound of left lower extremity which revealed there was a dentist tissue in the base of the wound without an abscess noted. 04/06/18 on evaluation today the patient's left lateral lower extremity ulcer actually appears to be doing fairly well which is excellent news. There does not appear to be any evidence of infection at this time which is also great news. Overall he still does have a significantly large ulceration although little by little he seems to be making progress. He is down to 10 mg a day of the prednisone. 04/20/18 on evaluation today patient actually appears to be doing excellent at this time in regard to his left lower extremity ulcer. He's making signs of good progress unfortunately this is taking much longer than we would really like  to see but nonetheless he is making progress. Fortunately there does not appear to be any evidence of infection at this  time. No fevers, chills, nausea, or vomiting noted at this time. The patient has not been using the Santyl due to the cost he hadn't got in this field yet. He's mainly been using the antibiotic ointment topically. Subsequently he also tells me that he really has not been scrubbing in the shower I think this would be helpful again as I told him it doesn't have to be anything too aggressive to even make it believe just enough to keep it free of some of the loose slough and biofilm on the wound surface. 05/11/18 on evaluation today patient's wound appears to be making slow but sure progress in regard to the left lateral lower extremity ulcer. He is been tolerating the dressing changes without complication. Fortunately there does not appear to be any evidence of infection at this time. He is still just using triple antibiotic ointment along with clobetasol occasionally over the area. He never got the Santyl and really does not seem to intend to in my pinion. 06/01/18 on evaluation today patient appears to be doing a little better in regard to his left lateral lower extremity ulcer. He states that overall he does not feel like he is doing as well with the Dapsone as he did with the prednisone. Nonetheless he sees his dermatologist later today and is gonna talk to them about the possibility of going back on the prednisone. Overall again I believe that the wound would be better if you would utilize Santyl but he really does not seem to be interested in going back to the [place] at this point. He has been using triple antibiotic ointment. 06/15/18 on evaluation today patient's wound actually appears to be doing about the same at this point. Fortunately there is no signs of infection at this time. He has made slight improvements although he continues to not really want to clean the wound bed at this point. He states that he just doesn't mess with it he doesn't want to cause any problems with everything else  he has going on. He has been on medication, antibiotics as prescribed by his dermatologist, for a staff infection of his lower extremities which is really drying out now and looking much better he tells me. Fortunately there is no sign of overall infection. RONDALE, NIES ([id number]) 06/29/18 on evaluation today patient appears to be doing well in regard to his left lateral lower surety ulcer all things considering. Fortunately his staff infection seems to be greatly improved compared to previous. He has no signs of infection and this is drying up quite nicely. He is still the doxycycline for this is no longer on cental, Dapsone, or any of the other medications. His dermatologist has recommended possibility of an infusion but right now he does not want to proceed with that. 07/13/18 on evaluation today patient appears to be doing about the same in regard to his left lateral lower surety ulcer. Fortunately there's no signs of infection at this time which is great news. Unfortunately he still builds up a significant amount of Slough/biofilm of the surface of the wound he still is not really cleaning this as he should be appropriately. Again I'm able to easily with saline and gauze remove the majority of this on the surface which if you would do this at home would likely be a dramatic improvement for him as far as getting the area  to improve. Nonetheless overall I still feel like he is making progress is just very slow. I think Santyl will be of benefit for him as well. Still he has not gotten this as of this point. 07/27/18 on evaluation today patient actually appears to be doing little worse in regards of the erythema around the periwound region of the wound he also tells me that he's been having more drainage currently compared to what he was experiencing last time I saw him. He states not quite as bad as what he had because this was infected previously but nonetheless is still appears to be doing  poorly. Fortunately there is no evidence of systemic infection at this point. The patient tells me that he is not going to be able to afford the Santyl. He is still waiting to hear about the infusion therapy with his dermatologist. Apparently she wants an updated colonoscopy first. 08/10/18 on evaluation today patient appears to be doing better in regard to his left lateral lower extremity ulcer. Fortunately he is showing signs of improvement in this regard he's actually been approved for Remicade infusion's as well although this has not been scheduled as of yet. Fortunately there's no signs of active infection at this time in regard to the wound although he is having some issues with infection of the right lower extremity is been seen as dermatologist for this. Fortunately they are definitely still working with him trying to keep things under control. 09/07/18 on evaluation today patient is actually doing rather well in regard to his left lateral lower extremity ulcer. He notes these actually having some hair grow back on his extremity which is something he has not seen in years. He also tells me that the pain is really not giving them any trouble at this time which is also good news overall she is very pleased with the progress he's using a combination of the mupirocin along with the probate is all mixed. 09/21/18 on evaluation today patient actually appears to be doing fairly well all things considered in regard to his looks from the ulcer. He's been tolerating the dressing changes without complication. Fortunately there's no signs of active infection at this time which is good news he is still on all antibiotics or prevention of the staff infection. He has been on prednisone for time although he states it is gonna contact his dermatologist and see if she put them on a short course due to some irritation that he has going on currently. Fortunately there's no evidence of any overall worsening this is  going very slow I think cental would be something that would be helpful for him although he states that $50 for tube is quite expensive. He therefore is not willing to get that at this point. 10/06/18 on evaluation today patient actually appears to be doing decently well in regard to his left lateral leg ulcer. He's been tolerating the dressing changes without complication. Fortunately there's no signs of active infection at this time. Overall I'm actually rather pleased with the progress he's making although it's slow he doesn't show any signs of infection and he does seem to be making some improvement. I do believe that he may need a switch up and dressings to try to help this to heal more appropriately and quickly. 10/19/18 on evaluation today patient actually appears to be doing better in regard to his left lateral lower extremity ulcer. This is shown signs of having much less Slough buildup at this point due to  the fact he has been using the Santyl. Obviously this is very good news. The overall size of the wound is not dramatically smaller but again the appearance is. 11/02/18 on evaluation today patient actually appears to be doing quite well in regard to his lower [place] ulcer. A lot of the skin around the ulcer is actually somewhat irritating at this point this seems to be more due to the dressing causing irritation from the adhesive that anything else. Fortunately there is no signs of active infection at this time. 11/24/18 on evaluation today patient appears to be doing a little worse in regard to his overall appearance of his lower extremity ulcer. There's more erythema and warmth around the wound unfortunately. He is currently on doxycycline which he has been on for some time. With that being said I'm not sure that seems to be helping with what appears to possibly be an acute cellulitis with regard to his left lower extremity ulcer. No fevers, chills, nausea, or vomiting noted at this  time. 12/08/18 on evaluation today patient's wounds actually appears to be doing significantly better compared to his last evaluation. He has been using Santyl along with alternating tripling about appointment as well as the steroid cream seems to be doing Fennell, Ozan ([id number]) quite well and the wound is showing signs of improvement which is excellent news. Fortunately there's no evidence of infection and in fact his culture came back negative with only normal skin flora noted. 12/21/2018 upon evaluation today patient actually appears to be doing excellent with regard to his ulcer. This is actually the best that I have seen it since have been helping to take care of him. It is both smaller as well as less slough noted on the surface of the wound and seems to be showing signs of good improvement with new skin growing from the edges. He has been using just the triamcinolone he does wonder if he can get a refill of that ointment today. 01/04/2019 upon evaluation today patient actually appears to be doing well with regard to his left lateral lower extremity ulcer. With that being said it does not appear to be that he is doing quite as well as last time as far as progression is concerned. There does not appear to be any signs of infection or significant irritation which is good news. With that being said I do believe that he may benefit from switching to a collagen based dressing based on how clean The wound appears. 01/18/2019 on evaluation today patient actually appears to be doing well with regard to his wound on the left lower extremity. He is not made a lot of progress compared to where we were previous but nonetheless does seem to be doing okay at this time which is good news. There is no signs of active infection which is also good news. My only concern currently is I do wish we can get him into utilizing the collagen dressing his insurance would not pay for the supplies that we ordered although  it appears that he may be able to order this through his supply company that he typically utilizes. This is [employer]. Nonetheless he did try to order it during the office visit today and it appears this did go through. We will see if he can get that it is a different brand but nonetheless he has collagen and I do think will be beneficial. 02/01/2019 on evaluation today patient actually appears to be doing a little worse today in regard  to the overall size of his wounds. Fortunately there is no signs of active infection at this time. That is visually. Nonetheless when this is happened before it was due to infection. For that reason were somewhat concerned about that this time as well. 02/08/2019 on evaluation today patient unfortunately appears to be doing slightly worse with regard to his wound upon evaluation today. Is measuring a little deeper and a little larger unfortunately. I am not really sure exactly what is causing this to enlarge he actually did see his dermatologist she is going to see about initiating Humira for him. Subsequently she also did do steroid injections into the wound itself in the periphery. Nonetheless still nonetheless he seems to be getting a little bit larger he is gone back to just using the steroid cream topically which I think is appropriate. I would say hold off on the collagen for the time being is definitely a good thing to do. Based on the culture results which we finally did get the final result back regarding it shows staph as the bacteria noted again that can be a normal skin bacteria based on the fact however he is having increased drainage and worsening of the wound measurement wise I would go ahead and place him on an antibiotic today I do believe for this. 02/15/2019 on evaluation today patient actually appears to be doing somewhat better in regard to his ulcer. There is no signs of worsening at this time I did review his culture results which showed evidence  of Staphylococcus aureus but not MRSA. Again this could just be more related to the normal skin bacteria although he states the drainage has slowed down quite a bit he may have had a mild infection not just colonization. And was much smaller and then since around10/04/2019 on evaluation today patient appears to be doing unfortunately worse as far as the size of the wound. I really feel like that this is steadily getting larger again it had been doing excellent right at the beginning of September we have seen a steady increase in the area of the wound it is almost 2-1/2 times the size it was on September 1. Obviously this is a bad trend this is not wanting to see. For that reason we went back to using just the topical triamcinolone cream which does seem to help with inflammation. I checked him for bacteria by way of culture and nothing showed positive there. I am considering giving him a short course of a tapering steroid Dosepak today to see if that is can be beneficial for him. The patient is in agreement with giving that a try. 03/08/2019 on evaluation today patient appears to be doing very well in comparison to last evaluation with regard to his lower extremity ulcer. This is showing signs of less inflammation and actually measuring slightly smaller compared to last time every other week over the past month and a half he has been measuring larger larger larger. Nonetheless I do believe that the issue has been inflammation the prednisone does seem to have been beneficial for him which is good news. No fevers, chills, nausea, vomiting, or diarrhea. 03/22/2019 on evaluation today patient appears to be doing about the same with regard to his leg ulcer. He has been tolerating the dressing changes without complication. With that being said the wound seems to be mostly arrested at its current size but really is not making any progress except for when we prescribed the prednisone. He did show some  signs  of dropping as far as the overall size of the wound during that interval week. Nonetheless this is something he is not on long-term at this point and unfortunately I think he is getting need either this or else the Humira which his dermatologist has discussed try to get approval for. With that being said he will be seeing his dermatologist on the 11th of this month that is November. RANDIE, TALLARICO ([id number]) 04/19/2019 on evaluation today patient appears to be doing really about the same the wound is measuring slightly larger compared to last time I saw him. He has not been into the office since November 2 due to the fact that he unfortunately had Covid as that his entire family. He tells me that it was rough but they did pull-through and he seems to be doing much better. Fortunately there is no signs of active infection at this time. No fevers, chills, nausea, vomiting, or diarrhea. 05/10/2019 on evaluation today patient unfortunately appears to be doing significantly worse as compared to last time I saw him. He does tell me that he has had his first dose of Humira and actually is scheduled to get the next one in the upcoming week. With that being said he tells me also that in the past several days he has been having a lot of issues with green drainage she showed me a picture this is more blue-green in color. He is also been having issues with increased sloughy buildup and the wound does appear to be larger today. Obviously this is not the direction that we want everything to take based on the starting of his Humira. Nonetheless I think this is definitely a result of likely infection and to be honest I think this is probably Pseudomonas causing the infection based on what I am seeing. 05/24/2019 on evaluation today patient unfortunately appears to be doing significantly worse compared to his prior evaluation with me 2 weeks ago. I did review his culture results which showed that he does have Staph  aureus as well as Pseudomonas noted on the culture. Nonetheless the Levaquin that I prescribed for him does not appear to have been appropriate and in fact he tells me he is no longer experiencing the green drainage and discharge that he had at the last visit. Fortunately there is no signs of active infection at this time which is good news although the wound has significantly worsened it in fact is much deeper than it was previous. We have been utilizing up to this point triamcinolone ointment as the prescription topical of choice but at this time I really feel like that the wound is getting need to be packed in order to appropriately manage this due to the deeper nature of the wound. Therefore something along the lines of an alginate dressing may be more appropriate. 05/31/2019 upon inspection today patient's wound actually showed signs of doing poorly at this point. Unfortunately he just does not seem to be making any good progress despite what we have tried. He actually did go ahead and pick up the Cipro and start taking that as he was noticing more green drainage he had previously completed the Levaquin that I prescribed for him as well. Nonetheless he missed his appointment for the seventh last week on Wednesday with the wound care center and [facility] where his dermatologist referred him. Obviously I do think a second opinion would be helpful at this point especially in light of the fact that the patient  seems to be doing so poorly despite the fact that we have tried everything that I really know how at this point. The only thing that ever seems to have helped him in the past is when he was on high doses of continual steroids that did seem to make a difference for him. 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 06/07/2019 upon  evaluation today patient unfortunately appears to be doing worse yet again with regard to his wound. In fact I am starting to question whether or not he may have a fluid pocket in the muscle at this point based on the bulging and the soft appearance to the central portion of the muscle area. There is not anything draining from the muscle itself at this time which is good news but nonetheless the wound is expanding. I am not really seeing any results of the Humira as far as overall wound progression based on what I am seeing at this point. The patient has been referred for second opinion with regard to his wound to the [facility] wound care center by his dermatologist which I definitely am not in opposition to. Unfortunately we tried multiple dressings in the past including collagen, alginate, and at one point even Hydrofera Blue. With that being said he is never really used it for any significant amount of time due to the fact that he often complains of pain associated with these dressings and then will go back to either using the Santyl which she has done intermittently or more frequently the triamcinolone. He is also using his own compression stockings. We have wrapped him in the past but again that was something else that he really was not a big fan of. 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 He seemed to do much better when he was on the steroids although honestly I understand that the steroids are not necessarily the best medication to be on long-term obviously 06/14/2019 on evaluation today patient appears to be doing actually a little bit better with regard to the overall  appearance with his leg. Unfortunately he does continue to have issues with what appears to be some fluid underneath the muscle although he did see the wound specialty center at [facility] last week their main goals were to see about infusion therapy in place of the Humira as they feel like that is not quite strong enough. They also recommended that we continue with the treatment otherwise as we are they felt like that was appropriate and they are okay with him continuing to follow-up here with [place] in that regard. With that being said they are also sending him to the vein specialist there to see about vein stripping and if that would be of benefit for him. Subsequently they also did not really address whether or not an ultrasound of the muscle area to see if there is anything that needs to be addressed here would be appropriate or not. For that reason I  discussed this with him last week I think we may proceed down that road at this point. LANE, ELAND ([id number]) Electronic Signature(s) Signed: 06/14/2019 9:05:45 AM By: Worthy Keeler PA-C Entered By: Worthy Keeler on 06/14/2019 09:05:44 SAVANNAH, ERBE ([id number]) -------------------------------------------------------------------------------- Physical Exam Details Patient Name: [name] Date of Service: 06/14/2019 8:15 AM Medical Record Number: [id number] Patient Account Number: [id number] Date of Birth/Sex: July 27, 1980 (39 y.o. M) Treating RN: [name] Primary Care Provider: [name] Other Clinician: Referring Provider: [name] Treating Provider/Extender: Melburn Hake, Isabel Freese Weeks in Treatment: 68 Constitutional Well-nourished and well-hydrated in no acute distress. Respiratory normal breathing without difficulty. Psychiatric this patient is able to make decisions and demonstrates good insight into disease process. Alert and Oriented x 3. pleasant and cooperative. Notes Upon inspection today patient's wound bed actually  showed signs of doing a little bit better at this point. As far as procedures really no sharp debridement was necessary nor performed. He still seems to potentially have a fluid pocket underneath the muscle area but does have me somewhat concerned. I discussed this with him again today. We are going to proceed with an ultrasound of the area to see if there indeed is any fluid pocket or if this is just a wrong perception on my part. Electronic Signature(s) Signed: 06/14/2019 9:06:16 AM By: Worthy Keeler PA-C Entered By: Worthy Keeler on 06/14/2019 09:06:15 DA, AUTHEMENT ([id number]) -------------------------------------------------------------------------------- Physician Orders Details Patient Name: [name] Date of Service: 06/14/2019 8:15 AM Medical Record Number: [id number] Patient Account Number: [id number] Date of Birth/Sex: 1981/04/08 (39 y.o. M) Treating RN: [name] Primary Care Provider: [name] Other Clinician: Referring Provider: [name] Treating Provider/Extender: Melburn Hake, Sabriya Yono Weeks in Treatment: 131 Verbal / Phone Orders: No Diagnosis Coding ICD-10 Coding Code Description [phone] Non-pressure chronic ulcer of left calf with fat layer exposed L88 Pyoderma gangrenosum I87.2 Venous insufficiency (chronic) (peripheral) L03.116 Cellulitis of left lower limb Wound Cleansing Wound #1 Left,Lateral Lower Leg o Clean wound with Normal Saline. Anesthetic (add to Medication List) Wound #1 Left,Lateral Lower Leg o Topical Lidocaine 4% cream applied to wound bed prior to debridement (In Clinic Only). Skin Barriers/Peri-Wound Care Wound #1 Left,Lateral Lower Leg o Triamcinolone Acetonide Ointment (TCA) Primary Wound Dressing o Dry Gauze Secondary Dressing Wound #1 Left,Lateral Lower Leg o Boardered Foam Dressing Dressing Change Frequency Wound #1 Left,Lateral Lower Leg o Change dressing every other day. Follow-up Appointments Wound #1  Left,Lateral Lower Leg o Return Appointment in 1 week. Edema Control Wound #1 Left,Lateral Lower Leg o Elevate legs to the level of the heart and pump ankles as often as possible [employer] o Ultrasound on Left Leg - Left Leg Mathews, Adeyemi ([id number]) Patient Medications Allergies: milk, Biaxin, seasonal Notifications Medication Indication Start End prednisone 06/14/2019 DOSE 48 - oral 10 mg tablets,dose pack - 48 tablets,dose pack oral tapering over 12 days. Do not take ibuprofen or aleve with this medication Electronic Signature(s) Signed: 06/14/2019 9:04:26 AM By: Worthy Keeler PA-C Entered By: Worthy Keeler on 06/14/2019 09:04:25 JASHAD, DEPAULA ([id number]) -------------------------------------------------------------------------------- Problem List Details Patient Name: [name] Date of Service: 06/14/2019 8:15 AM Medical Record Number: [id number] Patient Account Number: [id number] Date of Birth/Sex: February 01, 1981 (39 y.o. M) Treating RN: [name] Primary Care Provider: [name] Other Clinician: Referring Provider: [name] Treating Provider/Extender: Worthy Keeler Weeks in Treatment: 131 Active Problems ICD-10 Evaluated Encounter Code Description Active Date Today Diagnosis L97.222 Non-pressure chronic ulcer of left calf with fat layer exposed 12/04/2016  No Yes L88 Pyoderma gangrenosum 03/26/2017 No Yes I87.2 Venous insufficiency (chronic) (peripheral) 12/04/2016 No Yes L03.116 Cellulitis of left lower limb 05/24/2019 No Yes Inactive Problems ICD-10 Code Description Active Date Inactive Date L97.213 Non-pressure chronic ulcer of right calf with necrosis of muscle 04/02/2017 04/02/2017 Resolved Problems Electronic Signature(s) Signed: 06/14/2019 8:25:25 AM By: Worthy Keeler PA-C Entered By: Worthy Keeler on 06/14/2019 08:25:25 [name]  ([id number]) -------------------------------------------------------------------------------- Progress Note Details Patient Name: [name] Date of Service: 06/14/2019 8:15 AM Medical Record Number: [id number] Patient Account Number: [id number] Date of Birth/Sex: [birth year] (39 y.o. M) Treating RN: [name] Primary Care Provider: [name] Other Clinician: Referring Provider: [name] Treating Provider/Extender: Melburn Hake, Zyliah Schier Weeks in Treatment: 131 Subjective Chief Complaint Information obtained from Patient He is here in follow up evaluation for LLE pyoderma ulcer History of Present Illness (HPI) 12/04/16; 39 year old man who comes into the clinic today for review of a wound on the posterior left calf. He tells me that is been there for about a year. He is not a diabetic he does smoke half a pack per day. He was seen in the ER on 11/20/16 felt to have cellulitis around the wound and was given clindamycin. An x-ray did not show osteomyelitis. The patient initially tells me that he has a milk allergy that sets off a pruritic itching rash on his lower legs which she scratches incessantly and he thinks that's what may have set up the wound. He has been using various topical antibiotics and ointments without any effect. He works in a [employer]ing [employer] and is on his feet all day. He does not have a prior history of wounds however he does have the rash on both lower legs the right arm and the ventral aspect of his left arm. These are excoriations and clearly have had scratching however there are of macular looking areas on both legs including a substantial larger area on the right leg. This does not have an underlying open area. There is no blistering. The patient tells me that 2 years ago in [place] in response to the rash on his legs he saw a dermatologist who told him he had a condition which may be pyoderma gangrenosum although I may be putting words into his mouth. He seemed to  recognize this. 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 He is not in any treatment for this. He's had no recent travel 12/11/16; the patient arrives today with his wound and roughly the same condition we've been using silver alginate this is a deep punched out wound with some surrounding erythema but no tenderness. Biopsy I did did not show confirmed pyoderma gangrenosum suggested nonspecific inflammation and vasculitis but does not provide an actual description of what was seen by the pathologist. I'm really not able to understand this We have also received information from the patient's dermatologist in [place] notes from April 2016. This was a doctor Agarwal- antal. 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 He was prescribed topical steroid high potency under occlusion which helped but at this point the patient did not have a deep punched out wound. 12/18/16; the patient's wound is larger in terms of surface area however this surface looks better and there is less depth. The surrounding erythema also is better. The patient states that the wrap we put on came off 2 days ago when he has been using his compression stockings. He we are in the process  of getting a dermatology consult. 12/26/16 on evaluation today patient's left lower extremity wound shows evidence of infection with surrounding erythema noted. He has been tolerating the dressing changes but states that he has noted more discomfort. There is a larger area of erythema surrounding the wound. No fevers, chills, nausea, or vomiting noted at this time. With that being said the wound still does have slough covering the surface. He is not allergic to any medication that he is aware of at this point. In regard to his right lower extremity he had several regions that are erythematous and pruritic he wonders if there's anything we can do to help that. 01/02/17 I reviewed patient's wound  culture which was obtained his visit last week. He was placed on doxycycline at that point. Unfortunately that does not appear to be an antibiotic that would likely help with the situation however the pseudomonas noted on culture is sensitive to Cipro. Also unfortunately patient's wound seems to have a large compared to last week's evaluation. Not severely so but there are definitely increased measurements in general. He is continuing to have discomfort as well he writes this to be a seven out of 10. In fact he would prefer me not to perform any debridement today due to the fact that he is having discomfort and considering he has an active infection on the little reluctant to do so anyway. No fevers, Leffel, Daishon ([id number]) chills, nausea, or vomiting noted at this time. 01/08/17; patient seems dermatology on September 5. I suspect dermatology will want the slides from the biopsy I did sent to their pathologist. I'm not sure if there is a way we can expedite that. In any case the culture I did before I left on vacation 3 weeks ago showed Pseudomonas he was given 10 days of Cipro and per her description of her intake nurses is actually somewhat better this week although the wound is quite a bit bigger than I remember the last time I saw this. He still has 3 more days of Cipro 01/21/17; dermatology appointment tomorrow. He has completed the ciprofloxacin for Pseudomonas. Surface of the wound looks better however he is had some deterioration in the lesions on his right leg. Meantime the left lateral leg wound we will continue with sample 01/29/17; patient had his dermatology appointment but I can't yet see that note. He is completed his antibiotics. The wound is more superficial but considerably larger in circumferential area than when he came in. This is in his left lateral calf. He also has swollen erythematous areas with superficial wounds on the right leg and small papular areas on both arms.  There apparently areas in her his upper thighs and buttocks I did not look at those. Dermatology biopsied the right leg. Hopefully will have their input next week. 02/05/17; patient went back to see his dermatologist who told him that he had a "scratching problem" as well as staph. He is now on a 30 day course of doxycycline and I believe she gave him triamcinolone cream to the right leg areas to help with the itching [not exactly sure but probably triamcinolone]. She apparently looked at the left lateral leg wound although this was not rebiopsied and I think felt to be ultimately part of the same pathogenesis. He is using sample border foam and changing nevus himself. He now has a new open area on the right posterior leg which was his biopsy site I don't have any of the dermatology notes 02/12/17; we put  the patient in compression last week with SANTYL to the wound on the left leg and the biopsy. Edema is much better and the depth of the wound is now at level of skin. Area is still the same Biopsy site on the right lateral leg we've also been using santyl with a border foam dressing and he is changing this himself. 02/19/17; Using silver alginate started last week to both the substantial left leg wound and the biopsy site on the right wound. He is tolerating compression well. Has a an appointment with his primary M.D. tomorrow wondering about diuretics although I'm wondering if the edema problem is actually lymphedema 02/26/17; the patient has been to see his primary doctor Dr. Jerrel Ivory at [facility]. She started him on Lasix 20 mg and this seems to have helped with the edema. However we are not making substantial change with the left lateral calf wound and inflammation. The biopsy site on the right leg also looks stable but not really all that different. 03/12/17; the patient has been to see vein and vascular Dr. Lucky Cowboy. He has had venous reflux studies I have not reviewed these. I  did get a call from his dermatology office. 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 They therefore recommended clobetasol and tetracycline i.e. 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 03/26/17; apparently the patient just had reflux studies not an appointment with Dr. dew. She arrives in clinic today having applied clobetasol for 2-3 weeks. He notes over the last 2-3 days excessive drainage having to change the dressing 3-4 times a day and also expanding erythema. 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 Lillia Carmel at [facility] dermatology locally who kindly saw him at my request. 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 My concern was the degree of erythema medially extending from his left leg wound which was either inflammation from pyoderma or cellulitis. I put him on Augmentin however culture of the wound showed Pseudomonas which is quinolone sensitive. I really don't believe he has cellulitis however in view of everything I will continue and give him a course of Cipro. He is also on doxycycline as an immune modulator for the pyoderma. In addition to his original wound on the left lateral leg with surrounding erythema he has a wound on the right posterior calf which was an original biopsy site done by dermatology. This was felt to represent pathergy from pyoderma gangrenosum 04/16/17; pyoderma  gangrenosum. Saw Dr. Lillia Carmel yesterday. He has been using topical antibiotics to both wound areas his original wound on the left and the biopsies/pathergy area on the right. There is definitely some improvement in the inflammation around the wound on the right although the patient states he has increasing sensitivity of the wounds. He is on prednisone 60 and doxycycline 1 as prescribed by Dr. Lillia Carmel. He is covering the topical antibiotic with gauze and putting this in his own  compression stocks and changing this daily. He states that Dr. Lottie Rater did a culture of the left leg wound yesterday 05/07/17; pyoderma gangrenosum. The patient saw Dr. Lillia Carmel yesterday and has a follow-up with her in one month. He is still using topical antibiotics to both wounds although he can't recall exactly what type. He is still on prednisone 60 mg. Dr. Lillia Carmel stated that the doxycycline could stop if we were in agreement. 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 The right medial leg was induced by [name] ([id number]) biopsy/pathergy. The area on the right is essentially healed. 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 The right medial leg has closed and remains closed. He is still on prednisone 60. He tells me he missed his last dermatology appointment with Dr. Lillia Carmel but will make another appointment. He reports that her blood sugar at a recent screen in [place] was high 200's. He was 180 today. 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 I think he is going to require still much longer prednisone perhaps another 3 months before attempting to taper. In the meantime his wound is a lot better. Smaller. He is cleaning this off daily and applying topical antibiotics. When he was last  in the clinic I thought about changing to [facility] and actually put in a couple of calls to dermatology although probably not during their business hours. 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 He continues on oral prednisone. He has been using triple antibiotic ointment. There is surface debris and we will transition to [name] and have him return in 2 weeks. He has lost 30 pounds since his last appointment with lifestyle modification. He may benefit from topical steroid cream for treatment this can be considered at a later date. 08/22/17 on evaluation today patient appears to actually be doing rather well in regard to his left lateral lower extremity ulcer. He has actually been managed by Dr. Dellia Nims most recently. Patient is currently on oral steroids at this time. This seems to have been of benefit for him. Nonetheless his last visit was actually with Leah on 08/06/17. Currently he is not utilizing any topical steroid creams although this could be of benefit as well. No fevers, chills, nausea, or vomiting noted at this time. 09/05/17 on evaluation today patient appears to be doing better in regard to his left lateral lower extremity ulcer. He has been tolerating the dressing changes without complication. He is using Santyl with good effect. Overall I'm very pleased with how things are standing at this point. Patient likewise is happy that this is doing better. 09/19/17 on evaluation today patient actually appears to be doing rather well in regard to his left lateral lower extremity ulcer. 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 He's not having any significant pain. 10/03/17 on evaluation today patient appears to be doing excellent in regard to his lower extremity wound on the left secondary to  Pyoderma gangrenosum. He has been tolerating the Santyl without complication and in general I feel like he's making good progress. 10/17/17 on evaluation today patient appears to be  doing very well in regard to his left lateral lower surety ulcer. He has been tolerating the dressing changes without complication. There does not appear to be any evidence of infection he's alternating the Santyl and the triple antibiotic ointment every other day this seems to be doing well for him. 11/03/17 on evaluation today patient appears to be doing very well in regard to his left lateral lower extremity ulcer. He is been tolerating the dressing changes without complication which is good news. Fortunately there does not appear to be any evidence of infection which is also great news. Overall is doing excellent they are starting to taper down on the prednisone is down to 40 mg at this point it also started topical clobetasol for him. 11/17/17 on evaluation today patient appears to be doing well in regard to his left lateral lower surety ulcer. He's been tolerating the dressing changes without complication. He does note that he is having no pain, no excessive drainage or discharge, and overall he feels like things are going about how he would expect and hope they would. Overall he seems to have no evidence of infection at this time in my opinion which is good news. 12/04/17-He is seen in follow-up evaluation for right lateral lower extremity ulcer. He has been applying topical steroid cream. Today's measurement show slight increase in size. Over the next 2 weeks we will transition to every other day Santyl and steroid cream. He has been encouraged to monitor for changes and notify clinic with any concerns 12/15/17 on evaluation today patient's left lateral motion the ulcer and fortunately is doing worse again at this point. This just since last week to this week has close to doubled in size according to the patient. I did  not seeing last week's I do not have a visual to compare this to in our system was also down so we do not have all the charts and at this point. Nonetheless it does have me somewhat concerned in regard to the fact that again he was worried enough about it he has contact the dermatology that placed them back on the full strength, 50 mg a day of the prednisone that he was taken previous. He continues to alternate using clobetasol along with Santyl at this point. He is obviously somewhat frustrated. 12/22/17 on evaluation today patient appears to be doing a little worse compared to last evaluation. Unfortunately the wound is a little deeper and slightly larger than the last week's evaluation. With that being said he has made some progress in regard to the irritation surrounding at this time unfortunately despite that progress that's been made he still has a significant issue going on here. I'm not certain that he is having really any true infection at this time although with the Pyoderma gangrenosum it can Schall, Haralambos ([id number]) sometimes be difficult to differentiate infection versus just inflammation. For that reason I discussed with him today the possibility of perform a wound culture to ensure there's nothing overtly infected. 01/06/18 on evaluation today patient's wound is larger and deeper than previously evaluated. With that being said it did appear that his wound was infected after my last evaluation with him. Subsequently I did end up prescribing a prescription for Bactrim DS which she has been taking and having no complication with. Fortunately there does not appear to be any evidence of infection at this point in time as far as anything spreading, no want to touch, and overall I feel like things are showing signs  of improvement. 01/13/18 on evaluation today patient appears to be even a little larger and deeper than last time. There still muscle exposed in the base of the wound. Nonetheless  he does appear to be less erythematous I do believe inflammation is calming down also believe the infection looks like it's probably resolved at this time based on what I'm seeing. No fevers, chills, nausea, or vomiting noted at this time. 01/30/18 on evaluation today patient actually appears to visually look better for the most part. Unfortunately those visually this looks better he does seem to potentially have what may be an abscess in the muscle that has been noted in the central portion of the wound. This is the first time that I have noted what appears to be fluctuance in the central portion of the muscle. With that being said I'm somewhat more concerned about the fact that this might indicate an abscess formation at this location. I do believe that an ultrasound would be appropriate. This is likely something we need to try to do as soon as possible. He has been switch to mupirocin ointment and he is no longer using the steroid ointment as prescribed by dermatology he sees them again next week he's been decreased from 60 to 40 mg of prednisone. 03/09/18 on evaluation today patient actually appears to be doing a little better compared to last time I saw him. There's not as much erythema surrounding the wound itself. He I did review his most recent infectious disease note which was dated 02/24/18. He saw Dr. Michel Bickers in [place]. 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 Patient is now off of antibiotics and they are continually observing this. There seems to be no change in the past two weeks in my pinion based on what the patient says and what I see today compared to what Dr. Megan Salon likely saw two weeks ago. No fevers, chills, nausea, or vomiting noted at this time. 03/23/18 on evaluation today patient's wound actually appears to be showing signs of improvement which is good news. He is currently  still on the Dapsone. He is also working on tapering the prednisone to get off of this and Dr. Lottie Rater is working with him in this regard. Nonetheless overall I feel like the wound is doing well it does appear based on the infectious disease note that I reviewed from Dr. 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 I did also review the results of his ultrasound of left lower extremity which revealed there was a dentist tissue in the base of the wound without an abscess noted. 04/06/18 on evaluation today the patient's left lateral lower extremity ulcer actually appears to be doing fairly well which is excellent news. There does not appear to be any evidence of infection at this time which is also great news. Overall he still does have a significantly large ulceration although little by little he seems to be making progress. He is down to 10 mg a day of the prednisone. 04/20/18 on evaluation today patient actually appears to be doing excellent at this time in regard to his left lower extremity ulcer. He's making signs of good progress unfortunately this is taking much longer than we would really like to see but nonetheless he is making progress. Fortunately  there does not appear to be any evidence of infection at this time. No fevers, chills, nausea, or vomiting noted at this time. The patient has not been using the Santyl due to the cost he hadn't got in this field yet. He's mainly been using the antibiotic ointment topically. Subsequently he also tells me that he really has not been scrubbing in the shower I think this would be helpful again as I told him it doesn't have to be anything too aggressive to even make it believe just enough to keep it free of some of the loose slough and biofilm on the wound surface. 05/11/18 on evaluation today patient's wound appears to be making slow but  sure progress in regard to the left lateral lower extremity ulcer. He is been tolerating the dressing changes without complication. Fortunately there does not appear to be any evidence of infection at this time. He is still just using triple antibiotic ointment along with clobetasol occasionally over the area. He never got the Santyl and really does not seem to intend to in my pinion. 06/01/18 on evaluation today patient appears to be doing a little better in regard to his left lateral lower extremity ulcer. He states that overall he does not feel like he is doing as well with the Dapsone as he did with the prednisone. Nonetheless he sees his dermatologist later today and is gonna talk to them about the possibility of going back on the prednisone. Overall again I believe that the wound would be better if you would utilize Santyl but he really does not seem to be interested in going back to the Bessie at this point. He has been using triple antibiotic ointment. MEHDI, GIRONDA ([id number]) 06/15/18 on evaluation today patient's wound actually appears to be doing about the same at this point. Fortunately there is no signs of infection at this time. He has made slight improvements although he continues to not really want to clean the wound bed at this point. He states that he just doesn't mess with it he doesn't want to cause any problems with everything else he has going on. He has been on medication, antibiotics as prescribed by his dermatologist, for a staff infection of his lower extremities which is really drying out now and looking much better he tells me. Fortunately there is no sign of overall infection. 06/29/18 on evaluation today patient appears to be doing well in regard to his left lateral lower surety ulcer all things considering. Fortunately his staff infection seems to be greatly improved compared to previous. He has no signs of infection and this is drying up quite nicely. He is still the  doxycycline for this is no longer on cental, Dapsone, or any of the other medications. His dermatologist has recommended possibility of an infusion but right now he does not want to proceed with that. 07/13/18 on evaluation today patient appears to be doing about the same in regard to his left lateral lower surety ulcer. Fortunately there's no signs of infection at this time which is great news. Unfortunately he still builds up a significant amount of Slough/biofilm of the surface of the wound he still is not really cleaning this as he should be appropriately. Again I'm able to easily with saline and gauze remove the majority of this on the surface which if you would do this at home would likely be a dramatic improvement for him as far as getting the area to improve. Nonetheless overall I still feel like he  is making progress is just very slow. I think Santyl will be of benefit for him as well. Still he has not gotten this as of this point. 07/27/18 on evaluation today patient actually appears to be doing little worse in regards of the erythema around the periwound region of the wound he also tells me that he's been having more drainage currently compared to what he was experiencing last time I saw him. He states not quite as bad as what he had because this was infected previously but nonetheless is still appears to be doing poorly. Fortunately there is no evidence of systemic infection at this point. The patient tells me that he is not going to be able to afford the Santyl. He is still waiting to hear about the infusion therapy with his dermatologist. Apparently she wants an updated colonoscopy first. 08/10/18 on evaluation today patient appears to be doing better in regard to his left lateral lower extremity ulcer. Fortunately he is showing signs of improvement in this regard he's actually been approved for Remicade infusion's as well although this has not been scheduled as of yet. Fortunately there's  no signs of active infection at this time in regard to the wound although he is having some issues with infection of the right lower extremity is been seen as dermatologist for this. Fortunately they are definitely still working with him trying to keep things under control. 09/07/18 on evaluation today patient is actually doing rather well in regard to his left lateral lower extremity ulcer. He notes these actually having some hair grow back on his extremity which is something he has not seen in years. He also tells me that the pain is really not giving them any trouble at this time which is also good news overall she is very pleased with the progress he's using a combination of the mupirocin along with the probate is all mixed. 09/21/18 on evaluation today patient actually appears to be doing fairly well all things considered in regard to his looks from the ulcer. He's been tolerating the dressing changes without complication. Fortunately there's no signs of active infection at this time which is good news he is still on all antibiotics or prevention of the staff infection. He has been on prednisone for time although he states it is gonna contact his dermatologist and see if she put them on a short course due to some irritation that he has going on currently. Fortunately there's no evidence of any overall worsening this is going very slow I think cental would be something that would be helpful for him although he states that $50 for tube is quite expensive. He therefore is not willing to get that at this point. 10/06/18 on evaluation today patient actually appears to be doing decently well in regard to his left lateral leg ulcer. He's been tolerating the dressing changes without complication. Fortunately there's no signs of active infection at this time. Overall I'm actually rather pleased with the progress he's making although it's slow he doesn't show any signs of infection and he does seem to be  making some improvement. I do believe that he may need a switch up and dressings to try to help this to heal more appropriately and quickly. 10/19/18 on evaluation today patient actually appears to be doing better in regard to his left lateral lower extremity ulcer. This is shown signs of having much less Slough buildup at this point due to the fact he has been using the [employer]. Obviously  this is very good news. The overall size of the wound is not dramatically smaller but again the appearance is. 11/02/18 on evaluation today patient actually appears to be doing quite well in regard to his lower [place] ulcer. A lot of the skin around the ulcer is actually somewhat irritating at this point this seems to be more due to the dressing causing irritation from the adhesive that anything else. Fortunately there is no signs of active infection at this time. 11/24/18 on evaluation today patient appears to be doing a little worse in regard to his overall appearance of his lower extremity Fleming, John ([id number]) ulcer. There's more erythema and warmth around the wound unfortunately. He is currently on doxycycline which he has been on for some time. With that being said I'm not sure that seems to be helping with what appears to possibly be an acute cellulitis with regard to his left lower extremity ulcer. No fevers, chills, nausea, or vomiting noted at this time. 12/08/18 on evaluation today patient's wounds actually appears to be doing significantly better compared to his last evaluation. He has been using Santyl along with alternating tripling about appointment as well as the steroid cream seems to be doing quite well and the wound is showing signs of improvement which is excellent news. Fortunately there's no evidence of infection and in fact his culture came back negative with only normal skin flora noted. 12/21/2018 upon evaluation today patient actually appears to be doing excellent with regard to his ulcer.  This is actually the best that I have seen it since have been helping to take care of him. It is both smaller as well as less slough noted on the surface of the wound and seems to be showing signs of good improvement with new skin growing from the edges. He has been using just the triamcinolone he does wonder if he can get a refill of that ointment today. 01/04/2019 upon evaluation today patient actually appears to be doing well with regard to his left lateral lower extremity ulcer. With that being said it does not appear to be that he is doing quite as well as last time as far as progression is concerned. There does not appear to be any signs of infection or significant irritation which is good news. With that being said I do believe that he may benefit from switching to a collagen based dressing based on how clean The wound appears. 01/18/2019 on evaluation today patient actually appears to be doing well with regard to his wound on the left lower extremity. He is not made a lot of progress compared to where we were previous but nonetheless does seem to be doing okay at this time which is good news. There is no signs of active infection which is also good news. My only concern currently is I do wish we can get him into utilizing the collagen dressing his insurance would not pay for the supplies that we ordered although it appears that he may be able to order this through his supply company that he typically utilizes. This is [employer]. Nonetheless he did try to order it during the office visit today and it appears this did go through. We will see if he can get that it is a different brand but nonetheless he has collagen and I do think will be beneficial. 02/01/2019 on evaluation today patient actually appears to be doing a little worse today in regard to the overall size of his wounds. Fortunately there is  no signs of active infection at this time. That is visually. Nonetheless when this is  happened before it was due to infection. For that reason were somewhat concerned about that this time as well. 02/08/2019 on evaluation today patient unfortunately appears to be doing slightly worse with regard to his wound upon evaluation today. Is measuring a little deeper and a little larger unfortunately. I am not really sure exactly what is causing this to enlarge he actually did see his dermatologist she is going to see about initiating Humira for him. Subsequently she also did do steroid injections into the wound itself in the periphery. Nonetheless still nonetheless he seems to be getting a little bit larger he is gone back to just using the steroid cream topically which I think is appropriate. I would say hold off on the collagen for the time being is definitely a good thing to do. Based on the culture results which we finally did get the final result back regarding it shows staph as the bacteria noted again that can be a normal skin bacteria based on the fact however he is having increased drainage and worsening of the wound measurement wise I would go ahead and place him on an antibiotic today I do believe for this. 02/15/2019 on evaluation today patient actually appears to be doing somewhat better in regard to his ulcer. There is no signs of worsening at this time I did review his culture results which showed evidence of Staphylococcus aureus but not MRSA. Again this could just be more related to the normal skin bacteria although he states the drainage has slowed down quite a bit he may have had a mild infection not just colonization. And was much smaller and then since around10/04/2019 on evaluation today patient appears to be doing unfortunately worse as far as the size of the wound. I really feel like that this is steadily getting larger again it had been doing excellent right at the beginning of September we have seen a steady increase in the area of the wound it is almost 2-1/2 times  the size it was on September 1. Obviously this is a bad trend this is not wanting to see. For that reason we went back to using just the topical triamcinolone cream which does seem to help with inflammation. I checked him for bacteria by way of culture and nothing showed positive there. I am considering giving him a short course of a tapering steroid Dosepak today to see if that is can be beneficial for him. The patient is in agreement with giving that a try. 03/08/2019 on evaluation today patient appears to be doing very well in comparison to last evaluation with regard to his lower extremity ulcer. This is showing signs of less inflammation and actually measuring slightly smaller compared to last time every other week over the past month and a half he has been measuring larger larger larger. Nonetheless I do believe that the issue has been inflammation the prednisone does seem to have been beneficial for him which is good news. No fevers, chills, nausea, vomiting, or diarrhea. ATHARVA, MIRSKY ([id number]) 03/22/2019 on evaluation today patient appears to be doing about the same with regard to his leg ulcer. He has been tolerating the dressing changes without complication. With that being said the wound seems to be mostly arrested at its current size but really is not making any progress except for when we prescribed the prednisone. He did show some signs of dropping as far as  the overall size of the wound during that interval week. Nonetheless this is something he is not on long-term at this point and unfortunately I think he is getting need either this or else the Humira which his dermatologist has discussed try to get approval for. With that being said he will be seeing his dermatologist on the 11th of this month that is November. 04/19/2019 on evaluation today patient appears to be doing really about the same the wound is measuring slightly larger compared to last time I saw him. He has not been  into the office since November 2 due to the fact that he unfortunately had Covid as that his entire family. He tells me that it was rough but they did pull-through and he seems to be doing much better. Fortunately there is no signs of active infection at this time. No fevers, chills, nausea, vomiting, or diarrhea. 05/10/2019 on evaluation today patient unfortunately appears to be doing significantly worse as compared to last time I saw him. He does tell me that he has had his first dose of Humira and actually is scheduled to get the next one in the upcoming week. With that being said he tells me also that in the past several days he has been having a lot of issues with green drainage she showed me a picture this is more blue-green in color. He is also been having issues with increased sloughy buildup and the wound does appear to be larger today. Obviously this is not the direction that we want everything to take based on the starting of his Humira. Nonetheless I think this is definitely a result of likely infection and to be honest I think this is probably Pseudomonas causing the infection based on what I am seeing. 05/24/2019 on evaluation today patient unfortunately appears to be doing significantly worse compared to his prior evaluation with me 2 weeks ago. I did review his culture results which showed that he does have Staph aureus as well as Pseudomonas noted on the culture. Nonetheless the Levaquin that I prescribed for him does not appear to have been appropriate and in fact he tells me he is no longer experiencing the green drainage and discharge that he had at the last visit. Fortunately there is no signs of active infection at this time which is good news although the wound has significantly worsened it in fact is much deeper than it was previous. We have been utilizing up to this point triamcinolone ointment as the prescription topical of choice but at this time I really feel like that the  wound is getting need to be packed in order to appropriately manage this due to the deeper nature of the wound. Therefore something along the lines of an alginate dressing may be more appropriate. 05/31/2019 upon inspection today patient's wound actually showed signs of doing poorly at this point. Unfortunately he just does not seem to be making any good progress despite what we have tried. He actually did go ahead and pick up the Cipro and start taking that as he was noticing more green drainage he had previously completed the Levaquin that I prescribed for him as well. Nonetheless he missed his appointment for the seventh last week on Wednesday with the wound care center and [facility] where his dermatologist referred him. Obviously I do think a second opinion would be helpful at this point especially in light of the fact that the patient seems to be doing so poorly despite the fact  that we have tried everything that I really know how at this point. The only thing that ever seems to have helped him in the past is when he was on high doses of continual steroids that did seem to make a difference for him. 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 06/07/2019 upon evaluation today patient unfortunately appears to be doing worse yet again with regard to his wound. In fact I am starting to question whether or not he may have a fluid pocket in the muscle at this point based on the bulging and the soft appearance to the central portion of the muscle area. There is not anything draining from the muscle itself at this time which is good news but nonetheless the wound is expanding. I am not really seeing any results of the Humira as far as overall wound progression based on what I am seeing at this point. The patient has been referred for second  opinion with regard to his wound to the [facility] wound care center by his dermatologist which I definitely am not in opposition to. Unfortunately we tried multiple dressings in the past including collagen, alginate, and at one point even Hydrofera Blue. With that being said he is never really used it for any significant amount of time due to the fact that he often complains of pain associated with these dressings and then will go back to either using the Santyl which she has done intermittently or more frequently the triamcinolone. He is also using his own compression stockings. We have wrapped him in the past but again that was something else that he really was not a big fan of. 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 He seemed to do much better when he was on the steroids although honestly I understand that the steroids are not necessarily the best medication to be on long-term obviously 06/14/2019 on evaluation today patient appears to be doing actually a little bit better with regard to the overall appearance with his leg. Unfortunately he does continue to have issues with what appears to be some fluid underneath the muscle although he did see the wound specialty center at [facility] last week their main goals were to see about infusion therapy in place of the Humira as they feel like that is not quite strong enough. They also recommended that we continue with the treatment Wien, Arlan ([id number]) otherwise as we are they felt like that was appropriate and they are okay with him continuing to follow-up here with [place] in that regard. With that being said they are also sending him to the  vein specialist there to see about vein stripping and if that would be of benefit for him. Subsequently they also did not really address whether or not an ultrasound of the muscle area to see if there is anything that needs to be addressed here would be appropriate or not. For that reason I discussed this with him last week  I think we may proceed down that road at this point. Objective Constitutional Well-nourished and well-hydrated in no acute distress. Vitals Time Taken: 8:26 AM, Height: 71 in, Weight: 338 lbs, BMI: 47.1, Temperature: 98.3 F, Pulse: 77 bpm, Respiratory Rate: 16 breaths/min, Blood Pressure: 143/80 mmHg. Respiratory normal breathing without difficulty. Psychiatric this patient is able to make decisions and demonstrates good insight into disease process. Alert and Oriented x 3. pleasant and cooperative. General Notes: Upon inspection today patient's wound bed actually showed signs of doing a little bit better at this point. As far as procedures really no sharp debridement was necessary nor performed. He still seems to potentially have a fluid pocket underneath the muscle area but does have me somewhat concerned. I discussed this with him again today. We are going to proceed with an ultrasound of the area to see if there indeed is any fluid pocket or if this is just a wrong perception on my part. Integumentary (Hair, Skin) Wound #1 status is Open. Original cause of wound was Gradually Appeared. The wound is located on the Left,Lateral Lower Leg. The wound measures 5.6cm length x 5.7cm width x 1cm depth; 25.07cm^2 area and 25.07cm^3 volume. There is muscle and Fat Layer (Subcutaneous Tissue) Exposed exposed. There is no tunneling or undermining noted. There is a medium amount of serous drainage noted. The wound margin is distinct with the outline attached to the wound base. There is medium (34-66%) pink granulation within the wound bed. There is a medium (34-66%) amount of  necrotic tissue within the wound bed including Adherent Slough. 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 Anesthetic (add to Medication List): Wound #1 Left,Lateral Lower Leg: Topical Lidocaine 4% cream applied to wound bed prior to debridement (In Clinic Only). Skin Barriers/Peri-Wound Care: Wound #1 Left,Lateral Lower Leg: Triamcinolone Acetonide Ointment (TCA) Primary Wound Dressing: Dry Gauze Secondary Dressing: Wound #1 Left,Lateral Lower Leg: Boardered Foam Dressing Dressing Change Frequency: Wound #1 Left,Lateral Lower Leg: Change dressing every other day. Follow-up Appointments: Wound #1 Left,Lateral Lower Leg: Return Appointment in 1 week. Edema Control: Wound #1 Left,Lateral Lower Leg: Elevate legs to the level of the heart and pump ankles as often as possible ordered were: Ultrasound on Left Leg - Left Leg The following medication(s) was prescribed: prednisone oral 10 mg tablets,dose pack 48 48 tablets,dose pack oral tapering over 12 days. Do not take ibuprofen or aleve with this medication starting 06/14/2019 1. My suggestion at this time is good to be that we continue as before with the triamcinolone followed by the dry gauze and then bordered foam dressing that is been utilized up to this point. Patient seems to be doing well with that. 2. I am in a given a prescription for a prednisone tapering Dosepak of her 12 days 48 tablets for the next couple of weeks to see if we can help with calming down some additional expansion of the wound. The good news is today the wound did not appear to be significantly larger it was just minimally so. With that being said we have seen a steady progression over the past  several weeks even couple months which has me concerned. The good news is it looks like there may be some other things going on shortly to help things out. 3. The patient will continue  to follow-up with the wound specialty clinic and [facility] as well obviously they have plans for possible vein stripping along with infusion therapy we will see what works out in that regard. He sees them in about 3 weeks. We will see patient back for reevaluation in 1 week here in the clinic. If anything worsens or changes patient will contact our office for additional recommendations. Electronic Signature(s) Signed: 06/14/2019 9:07:31 AM By: Worthy Keeler PA-C Entered By: Worthy Keeler on 06/14/2019 09:07:31 Saidi, Faaris ([id number]) -------------------------------------------------------------------------------- SuperBill Details Patient Name: [name] Date of Service: 06/14/2019 Medical Record Number: [id number] Patient Account Number: [id number] Date of Birth/Sex: 07-Jan-1981 (39 y.o. M) Treating RN: [name] Primary Care Provider: [name] Other Clinician: Referring Provider: [name] Treating Provider/Extender: Melburn Hake, Debbe Crumble Weeks in Treatment: 131 Diagnosis Coding ICD-10 Codes Code Description [phone] Non-pressure chronic ulcer of left calf with fat layer exposed L88 Pyoderma gangrenosum I87.2 Venous insufficiency (chronic) (peripheral) L03.116 Cellulitis of left lower limb Facility Procedures CPT4 Code: [id number] Description: 36644 - WOUND CARE VISIT-LEV 3 EST PT Modifier: Quantity: 1 Physician Procedures CPT4 Code: [id number] Description: 95638 - WC PHYS LEVEL 4 - EST PT ICD-10 Diagnosis Description L97.222 Non-pressure chronic ulcer of left calf with fat layer expo L88 Pyoderma gangrenosum I87.2 Venous insufficiency (chronic) (peripheral) L03.116 Cellulitis of left lower  limb Modifier: sed Quantity: 1 Electronic Signature(s) Signed: 06/14/2019 9:07:47 AM By:  Worthy Keeler PA-C Entered By: Worthy Keeler on 06/14/2019 09:07:46

## 2019-06-15 ENCOUNTER — Ambulatory Visit
Admit: 2019-06-15 | Discharge: 2019-06-16 | Payer: PRIVATE HEALTH INSURANCE | Attending: Vascular Surgery | Primary: Vascular Surgery

## 2019-06-15 DIAGNOSIS — I872 Venous insufficiency (chronic) (peripheral): Principal | ICD-10-CM

## 2019-06-18 ENCOUNTER — Ambulatory Visit
Admission: RE | Admit: 2019-06-18 | Discharge: 2019-06-18 | Disposition: A | Discharge status: Home / Self Care | Payer: 59 | Source: Ambulatory Visit | Attending: Physician Assistant | Admitting: Physician Assistant

## 2019-06-18 ENCOUNTER — Other Ambulatory Visit: Payer: Self-pay

## 2019-06-18 DIAGNOSIS — S81802A Unspecified open wound, left lower leg, initial encounter: Secondary | ICD-10-CM | POA: Diagnosis present

## 2019-06-21 ENCOUNTER — Other Ambulatory Visit: Payer: Self-pay

## 2019-06-21 ENCOUNTER — Other Ambulatory Visit
Admission: RE | Admit: 2019-06-21 | Discharge: 2019-06-21 | Disposition: A | Discharge status: Home / Self Care | Payer: 59 | Attending: Physician Assistant | Admitting: Physician Assistant

## 2019-06-21 ENCOUNTER — Encounter: Payer: 59 | Attending: Physician Assistant | Admitting: Physician Assistant

## 2019-06-21 DIAGNOSIS — Z6841 Body Mass Index (BMI) 40.0 and over, adult: Secondary | ICD-10-CM | POA: Diagnosis not present

## 2019-06-21 DIAGNOSIS — F17210 Nicotine dependence, cigarettes, uncomplicated: Secondary | ICD-10-CM | POA: Diagnosis not present

## 2019-06-21 DIAGNOSIS — E669 Obesity, unspecified: Secondary | ICD-10-CM | POA: Insufficient documentation

## 2019-06-21 DIAGNOSIS — K5190 Ulcerative colitis, unspecified, without complications: Secondary | ICD-10-CM | POA: Diagnosis not present

## 2019-06-21 DIAGNOSIS — I872 Venous insufficiency (chronic) (peripheral): Secondary | ICD-10-CM | POA: Insufficient documentation

## 2019-06-21 DIAGNOSIS — Z888 Allergy status to other drugs, medicaments and biological substances status: Secondary | ICD-10-CM | POA: Insufficient documentation

## 2019-06-21 DIAGNOSIS — L97213 Non-pressure chronic ulcer of right calf with necrosis of muscle: Secondary | ICD-10-CM | POA: Diagnosis not present

## 2019-06-21 DIAGNOSIS — L97222 Non-pressure chronic ulcer of left calf with fat layer exposed: Secondary | ICD-10-CM | POA: Insufficient documentation

## 2019-06-21 DIAGNOSIS — L88 Pyoderma gangrenosum: Secondary | ICD-10-CM | POA: Insufficient documentation

## 2019-06-21 DIAGNOSIS — Z91011 Allergy to milk products: Secondary | ICD-10-CM | POA: Insufficient documentation

## 2019-06-21 DIAGNOSIS — I10 Essential (primary) hypertension: Secondary | ICD-10-CM | POA: Diagnosis not present

## 2019-06-21 DIAGNOSIS — G4730 Sleep apnea, unspecified: Secondary | ICD-10-CM | POA: Diagnosis not present

## 2019-06-21 DIAGNOSIS — L03116 Cellulitis of left lower limb: Secondary | ICD-10-CM | POA: Insufficient documentation

## 2019-06-21 DIAGNOSIS — Z881 Allergy status to other antibiotic agents status: Secondary | ICD-10-CM | POA: Diagnosis not present

## 2019-06-21 DIAGNOSIS — Z79899 Other long term (current) drug therapy: Secondary | ICD-10-CM | POA: Diagnosis not present

## 2019-06-21 DIAGNOSIS — Z7952 Long term (current) use of systemic steroids: Secondary | ICD-10-CM | POA: Diagnosis not present

## 2019-06-21 DIAGNOSIS — B999 Unspecified infectious disease: Secondary | ICD-10-CM | POA: Insufficient documentation

## 2019-06-21 NOTE — Progress Notes (Addendum)
ORY, ELTING ([id number]) Visit Report for 06/21/2019 Chief Complaint Document Details Patient Name: [name], [name] Date of Service: 06/21/2019 8:15 AM Medical Record Number: [id number] Patient Account Number: [id number] Date of Birth/Sex: 1981-01-22 (39 y.o. M) Treating RN: [name] Primary Care Provider: [name] Other Clinician: Referring Provider: [name] Treating Provider/Extender: Melburn Hake, Zeno Hickel Weeks in Treatment: 132 Information Obtained from: Patient Chief Complaint He is here in follow up evaluation for LLE pyoderma [name] Electronic Signature(s) Signed: 06/21/2019 8:23:17 AM By: Worthy Keeler PA-C Entered By: Worthy Keeler on 06/21/2019 08:23:17 GLENVILLE, ESPINA ([id number]) -------------------------------------------------------------------------------- HPI Details Patient Name: [name] Date of Service: 06/21/2019 8:15 AM Medical Record Number: [id number] Patient Account Number: [id number] Date of Birth/Sex: 29-Jan-1981 (39 y.o. M) Treating RN: [name] Primary Care Provider: [name] Other Clinician: Referring Provider: [name] Treating Provider/Extender: Melburn Hake, Kaycie Pegues Weeks in Treatment: 132 History of Present Illness HPI Description: 12/04/16; 39 year old man who comes into the clinic today for review of a wound on the posterior left calf. He tells me that is been there for about a year. He is not a diabetic he does smoke half a pack per day. He was seen in the ER on 11/20/16 felt to have cellulitis around the wound and was given clindamycin. An x-ray did not show osteomyelitis. The patient initially tells me that he has a milk allergy that sets off a pruritic itching rash on his lower legs which she scratches incessantly and he thinks that's what may have set up the wound. He has been using various topical antibiotics and ointments without any effect. He works in a [employer] and is on his feet all day. He does not have a prior history  of wounds however he does have the rash on both lower legs the right arm and the ventral aspect of his left arm. These are excoriations and clearly have had scratching however there are of macular looking areas on both legs including a substantial larger area on the right leg. This does not have an underlying open area. There is no blistering. The patient tells me that 2 years ago in [place] in response to the rash on his legs he saw a dermatologist who told him he had a condition which may be pyoderma gangrenosum although I may be putting words into his mouth. He seemed to recognize this. 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 He is not in any treatment for this. He's had no recent travel 12/11/16; the patient arrives today with his wound and roughly the same condition we've been using silver alginate this is a deep punched out wound with some surrounding erythema but no tenderness. Biopsy I did did not show confirmed pyoderma gangrenosum suggested nonspecific inflammation and vasculitis but does not provide an actual description of what was seen by the pathologist. I'm really not able to understand this We have also received information from the patient's dermatologist in [place] notes from April 2016. This was a doctor Agarwal- antal. 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 He was prescribed topical steroid high potency under occlusion which helped but at this point the patient did not have a deep punched out wound. 12/18/16; the patient's wound is larger in terms of surface area however this surface looks better and there is less depth. The surrounding erythema also is better. The patient states that the wrap we put on came off 2 days ago when he has been using  his compression stockings. He we are in the process of getting a dermatology consult. 12/26/16 on evaluation today patient's left lower extremity wound shows evidence of infection  with surrounding erythema noted. He has been tolerating the dressing changes but states that he has noted more discomfort. There is a larger area of erythema surrounding the wound. No fevers, chills, nausea, or vomiting noted at this time. With that being said the wound still does have slough covering the surface. He is not allergic to any medication that he is aware of at this point. In regard to his right lower extremity he had several regions that are erythematous and pruritic he wonders if there's anything we can do to help that. 01/02/17 I reviewed patient's wound culture which was obtained his visit last week. He was placed on doxycycline at that point. Unfortunately that does not appear to be an antibiotic that would likely help with the situation however the pseudomonas noted on culture is sensitive to Cipro. Also unfortunately patient's wound seems to have a large compared to last week's evaluation. Not severely so but there are definitely increased measurements in general. He is continuing to have discomfort as well he writes this to be a seven out of 10. In fact he would prefer me not to perform any debridement today due to the fact that he is having discomfort and considering he has an active infection on the little reluctant to do so anyway. No fevers, chills, nausea, or vomiting noted at this time. 01/08/17; patient seems dermatology on September 5. I suspect dermatology will want the slides from the biopsy I did sent to their pathologist. I'm not sure if there is a way we can expedite that. In any case the culture I did before I left on vacation 3 weeks ago showed Pseudomonas he was given 10 days of Cipro and per her description of her intake nurses is actually somewhat better this week although the wound is quite a bit bigger than I remember the last time I saw this. He still has 3 more days of Cipro [name], [name] ([id number]) 01/21/17; dermatology appointment tomorrow. He has completed  the ciprofloxacin for Pseudomonas. Surface of the wound looks better however he is had some deterioration in the lesions on his right leg. Meantime the left lateral leg wound we will continue with sample 01/29/17; patient had his dermatology appointment but I can't yet see that note. He is completed his antibiotics. The wound is more superficial but considerably larger in circumferential area than when he came in. This is in his left lateral calf. He also has swollen erythematous areas with superficial wounds on the right leg and small papular areas on both arms. There apparently areas in her his upper thighs and buttocks I did not look at those. Dermatology biopsied the right leg. Hopefully will have their input next week. 02/05/17; patient went back to see his dermatologist who told him that he had a "scratching problem" as well as staph. He is now on a 30 day course of doxycycline and I believe she gave him triamcinolone cream to the right leg areas to help with the itching [not exactly sure but probably triamcinolone]. She apparently looked at the left lateral leg wound although this was not rebiopsied and I think felt to be ultimately part of the same pathogenesis. He is using sample border foam and changing nevus himself. He now has a new open area on the right posterior leg which was his biopsy site I don't  have any of the dermatology notes 02/12/17; we put the patient in compression last week with SANTYL to the wound on the left leg and the biopsy. Edema is much better and the depth of the wound is now at level of skin. Area is still the same oBiopsy site on the right lateral leg we've also been using santyl with a border foam dressing and he is changing this himself. 02/19/17; Using silver alginate started last week to both the substantial left leg wound and the biopsy site on the right wound. He is tolerating compression well. Has a an appointment with his primary M.D. tomorrow wondering  about diuretics although I'm wondering if the edema problem is actually lymphedema 02/26/17; the patient has been to see his primary doctor Dr. Jerrel Ivory at [place] our primary care. She started him on Lasix 20 mg and this seems to have helped with the edema. However we are not making substantial change with the left lateral calf wound and inflammation. The biopsy site on the right leg also looks stable but not really all that different. 03/12/17; the patient has been to see vein and vascular Dr. Lucky Cowboy. He has had venous reflux studies I have not reviewed these. I did get a call from his dermatology office. 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 They therefore recommended clobetasol and tetracycline i.e. 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 03/26/17; apparently the patient just had reflux studies not an appointment with Dr. dew. She arrives in clinic today having applied clobetasol for 2-3 weeks. He notes over the last 2-3 days excessive drainage having to change the dressing 3-4 times a day and also expanding erythema. 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 Lillia Carmel at [facility] dermatology locally who kindly saw him at my request. 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 My concern was the degree of erythema medially extending from his left leg wound which was either inflammation from pyoderma  or cellulitis. I put him on Augmentin however culture of the wound showed Pseudomonas which is quinolone sensitive. I really don't believe he has cellulitis however in view of everything I will continue and give him a course of Cipro. He is also on doxycycline as an immune modulator for the pyoderma. In addition to his original wound on the left lateral leg with surrounding erythema he has a wound on the right posterior calf which was an original biopsy site done by dermatology. 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 Saw Dr. Lillia Carmel yesterday. He has been using topical antibiotics to both wound areas his original wound on the left and the biopsies/pathergy area on the right. There is definitely some improvement in the inflammation around the wound on the right although the patient states he has increasing sensitivity of the wounds. He is on prednisone 60 and doxycycline 1 as prescribed by Dr. Lillia Carmel. He is covering the topical  antibiotic with gauze and putting this in his own compression stocks and changing this daily. He states that Dr. Lottie Rater did a culture of the left leg wound yesterday 05/07/17; pyoderma gangrenosum. The patient saw Dr. Lillia Carmel yesterday and has a follow-up with her in one month. He is still using topical antibiotics to both wounds although he can't recall exactly what type. He is still on prednisone 60 mg. Dr. Lillia Carmel stated that the doxycycline could stop if we were in agreement. 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 The right medial leg was induced by biopsy/pathergy. The area on the right is essentially healed. 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 The right medial leg has closed and remains closed.  He is still on prednisone 60. oHe tells me he missed his last dermatology appointment with Dr. Lillia Carmel but will make another appointment. He reports that her blood sugar at a recent screen in [place] was high 200's. He was 180 today. He is more cushingoid blood pressure is [name], [name] ([id number]) up a bit. I think he is going to require still much longer prednisone perhaps another 3 months before attempting to taper. In the meantime his wound is a lot better. Smaller. He is cleaning this off daily and applying topical antibiotics. When he was last in the clinic I thought about changing to [facility] and actually put in a couple of calls to dermatology although probably not during their business hours. In any case the wound looks better smaller I don't think there is any need to change what he is doing 08/06/17-he is here in follow up evaluation for pyoderma left leg [name]. He continues on oral prednisone. He has been using triple antibiotic ointment. There is surface debris and we will transition to [facility] and have him return in 2 weeks. He has lost 30 pounds since his last appointment with lifestyle modification. He may benefit from topical steroid cream for treatment this can be considered at a later date. 08/22/17 on evaluation today patient appears to actually be doing rather well in regard to his left lateral lower extremity [name]. He has actually been managed by Dr. Dellia Nims most recently. Patient is currently on oral steroids at this time. This seems to have been of benefit for him. Nonetheless his last visit was actually with Leah on 08/06/17. Currently he is not utilizing any topical steroid creams although this could be of benefit as well. No fevers, chills, nausea, or vomiting noted at this time. 09/05/17 on evaluation today patient appears to be doing better in regard to his left lateral lower extremity [name]. He has been tolerating the dressing changes without complication. He is using  Santyl with good effect. Overall I'm very pleased with how things are standing at this point. Patient likewise is happy that this is doing better. 09/19/17 on evaluation today patient actually appears to be doing rather well in regard to his left lateral lower extremity [name]. 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 He's not having any significant pain. 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 He has been tolerating the Santyl without complication and in general I feel like he's making good  progress. 10/17/17 on evaluation today patient appears to be doing very well in regard to his left lateral lower surety [name]. He has been tolerating the dressing changes without complication. There does not appear to be any evidence of infection he's alternating the Santyl and the triple antibiotic ointment every other day this seems to be doing well for him. 11/03/17 on evaluation today patient appears to be doing very well in regard to his left lateral lower extremity [name]. He is been tolerating the dressing changes without complication which is good news. Fortunately there does not appear to be any evidence of infection which is also great news. Overall is doing excellent they are starting to taper down on the prednisone is down to 40 mg at this point it also started topical clobetasol for him. 11/17/17 on evaluation today patient appears to be doing well in regard to his left lateral lower surety [name]. He's been tolerating the dressing changes without complication. He does note that he is having no pain, no excessive drainage or discharge, and overall he feels like things are going about how he would expect and hope they would. Overall he seems to have no evidence of infection at this time in my opinion which is good  news. 12/04/17-He is seen in follow-up evaluation for right lateral lower extremity [name]. He has been applying topical steroid cream. Today's measurement show slight increase in size. Over the next 2 weeks we will transition to every other day Santyl and steroid cream. He has been encouraged to monitor for changes and notify clinic with any concerns 12/15/17 on evaluation today patient's left lateral motion the [name] and fortunately is doing worse again at this point. This just since last week to this week has close to doubled in size according to the patient. I did not seeing last week's I do not have a visual to compare this to in our system was also down so we do not have all the charts and at this point. Nonetheless it does have me somewhat concerned in regard to the fact that again he was worried enough about it he has contact the dermatology that placed them back on the full strength, 50 mg a day of the prednisone that he was taken previous. He continues to alternate using clobetasol along with Santyl at this point. He is obviously somewhat frustrated. 12/22/17 on evaluation today patient appears to be doing a little worse compared to last evaluation. Unfortunately the wound is a little deeper and slightly larger than the last week's evaluation. With that being said he has made some progress in regard to the irritation surrounding at this time unfortunately despite that progress that's been made he still has a significant issue going on here. 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 For that reason I discussed with him today the possibility of perform a wound culture to ensure there's nothing overtly infected. 01/06/18 on evaluation today patient's wound is larger and deeper than previously evaluated. With that being said it did appear that his  wound was infected after my last evaluation with him. Subsequently I did end up prescribing a prescription for Bactrim DS which she has been taking and having no complication with. Fortunately there does not appear to be any evidence of Crissman, [name] ([id number]) infection at this point in time as far as anything spreading, no want to  touch, and overall I feel like things are showing signs of improvement. 01/13/18 on evaluation today patient appears to be even a little larger and deeper than last time. There still muscle exposed in the base of the wound. Nonetheless he does appear to be less erythematous I do believe inflammation is calming down also believe the infection looks like it's probably resolved at this time based on what I'm seeing. No fevers, chills, nausea, or vomiting noted at this time. 01/30/18 on evaluation today patient actually appears to visually look better for the most part. Unfortunately those visually this looks better he does seem to potentially have what may be an abscess in the muscle that has been noted in the central portion of the wound. This is the first time that I have noted what appears to be fluctuance in the central portion of the muscle. With that being said I'm somewhat more concerned about the fact that this might indicate an abscess formation at this location. I do believe that an ultrasound would be appropriate. This is likely something we need to try to do as soon as possible. He has been switch to mupirocin ointment and he is no longer using the steroid ointment as prescribed by dermatology he sees them again next week he's been decreased from 60 to 40 mg of prednisone. 03/09/18 on evaluation today patient actually appears to be doing a little better compared to last time I saw him. There's not as much erythema surrounding the wound itself. He I did review his most recent infectious disease note which was dated 02/24/18. He saw Dr. Michel Bickers in [place].  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 Patient is now off of antibiotics and they are continually observing this. There seems to be no change in the past two weeks in my pinion based on what the patient says and what I see today compared to what Dr. Megan Salon likely saw two weeks ago. No fevers, chills, nausea, or vomiting noted at this time. 03/23/18 on evaluation today patient's wound actually appears to be showing signs of improvement which is good news. He is currently still on the Dapsone. He is also working on tapering the prednisone to get off of this and Dr. Lottie Rater is working with him in this regard. Nonetheless overall I feel like the wound is doing well it does appear based on the infectious disease note that I reviewed from Dr. 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 I did also review the results of his ultrasound of left lower extremity which revealed there was a dentist tissue in the base of the wound without an abscess noted. 04/06/18 on evaluation today the patient's left lateral lower extremity [name] actually appears to be doing fairly well which is excellent news. There does not appear to be any evidence of infection at this time which is also great news. Overall he still does have a significantly large ulceration although little by little he seems to be making progress. He is down to 10 mg a day of the prednisone. 04/20/18 on evaluation today patient actually appears to be doing excellent at this time in regard to his left lower extremity [name]. He's making signs of good progress unfortunately this is taking much longer than we would really like  to see but nonetheless he is making progress. Fortunately there does not appear to be any evidence of infection at this time. No  fevers, chills, nausea, or vomiting noted at this time. The patient has not been using the Santyl due to the cost he hadn't got in this field yet. He's mainly been using the antibiotic ointment topically. Subsequently he also tells me that he really has not been scrubbing in the shower I think this would be helpful again as I told him it doesn't have to be anything too aggressive to even make it believe just enough to keep it free of some of the loose slough and biofilm on the wound surface. 05/11/18 on evaluation today patient's wound appears to be making slow but sure progress in regard to the left lateral lower extremity [name]. He is been tolerating the dressing changes without complication. Fortunately there does not appear to be any evidence of infection at this time. He is still just using triple antibiotic ointment along with clobetasol occasionally over the area. He never got the Santyl and really does not seem to intend to in my pinion. 06/01/18 on evaluation today patient appears to be doing a little better in regard to his left lateral lower extremity [name]. He states that overall he does not feel like he is doing as well with the Dapsone as he did with the prednisone. Nonetheless he sees his dermatologist later today and is gonna talk to them about the possibility of going back on the prednisone. Overall again I believe that the wound would be better if you would utilize Santyl but he really does not seem to be interested in going back to the [place] at this point. He has been using triple antibiotic ointment. 06/15/18 on evaluation today patient's wound actually appears to be doing about the same at this point. Fortunately there is no signs of infection at this time. He has made slight improvements although he continues to not really want to clean the wound bed at this point. He states that he just doesn't mess with it he doesn't want to cause any problems with everything else he has going  on. He has been on medication, antibiotics as prescribed by his dermatologist, for a staff infection of his lower extremities which is really drying out now and looking much better he tells me. Fortunately there is no sign of overall infection. [name], [name] ([id number]) 06/29/18 on evaluation today patient appears to be doing well in regard to his left lateral lower surety [name] all things considering. Fortunately his staff infection seems to be greatly improved compared to previous. He has no signs of infection and this is drying up quite nicely. He is still the doxycycline for this is no longer on cental, Dapsone, or any of the other medications. His dermatologist has recommended possibility of an infusion but right now he does not want to proceed with that. 07/13/18 on evaluation today patient appears to be doing about the same in regard to his left lateral lower surety [name]. Fortunately there's no signs of infection at this time which is great news. Unfortunately he still builds up a significant amount of Slough/biofilm of the surface of the wound he still is not really cleaning this as he should be appropriately. Again I'm able to easily with saline and gauze remove the majority of this on the surface which if you would do this at home would likely be a dramatic improvement for him as far as getting the area  to improve. Nonetheless overall I still feel like he is making progress is just very slow. I think Santyl will be of benefit for him as well. Still he has not gotten this as of this point. 07/27/18 on evaluation today patient actually appears to be doing little worse in regards of the erythema around the periwound region of the wound he also tells me that he's been having more drainage currently compared to what he was experiencing last time I saw him. He states not quite as bad as what he had because this was infected previously but nonetheless is still appears to be doing poorly. Fortunately  there is no evidence of systemic infection at this point. The patient tells me that he is not going to be able to afford the Santyl. He is still waiting to hear about the infusion therapy with his dermatologist. Apparently she wants an updated colonoscopy first. 08/10/18 on evaluation today patient appears to be doing better in regard to his left lateral lower extremity [name]. Fortunately he is showing signs of improvement in this regard he's actually been approved for Remicade infusion's as well although this has not been scheduled as of yet. Fortunately there's no signs of active infection at this time in regard to the wound although he is having some issues with infection of the right lower extremity is been seen as dermatologist for this. Fortunately they are definitely still working with him trying to keep things under control. 09/07/18 on evaluation today patient is actually doing rather well in regard to his left lateral lower extremity [name]. He notes these actually having some hair grow back on his extremity which is something he has not seen in years. He also tells me that the pain is really not giving them any trouble at this time which is also good news overall she is very pleased with the progress he's using a combination of the mupirocin along with the probate is all mixed. 09/21/18 on evaluation today patient actually appears to be doing fairly well all things considered in regard to his looks from the [name]. He's been tolerating the dressing changes without complication. Fortunately there's no signs of active infection at this time which is good news he is still on all antibiotics or prevention of the staff infection. He has been on prednisone for time although he states it is gonna contact his dermatologist and see if she put them on a short course due to some irritation that he has going on currently. Fortunately there's no evidence of any overall worsening this is going very slow I  think cental would be something that would be helpful for him although he states that $50 for tube is quite expensive. He therefore is not willing to get that at this point. 10/06/18 on evaluation today patient actually appears to be doing decently well in regard to his left lateral leg [name]. He's been tolerating the dressing changes without complication. Fortunately there's no signs of active infection at this time. Overall I'm actually rather pleased with the progress he's making although it's slow he doesn't show any signs of infection and he does seem to be making some improvement. I do believe that he may need a switch up and dressings to try to help this to heal more appropriately and quickly. 10/19/18 on evaluation today patient actually appears to be doing better in regard to his left lateral lower extremity [name]. This is shown signs of having much less Slough buildup at this point due to  the fact he has been using the Santyl. Obviously this is very good news. The overall size of the wound is not dramatically smaller but again the appearance is. 11/02/18 on evaluation today patient actually appears to be doing quite well in regard to his lower [name] [name]. A lot of the skin around the [name] is actually somewhat irritating at this point this seems to be more due to the dressing causing irritation from the adhesive that anything else. Fortunately there is no signs of active infection at this time. 11/24/18 on evaluation today patient appears to be doing a little worse in regard to his overall appearance of his lower extremity [name]. There's more erythema and warmth around the wound unfortunately. He is currently on doxycycline which he has been on for some time. With that being said I'm not sure that seems to be helping with what appears to possibly be an acute cellulitis with regard to his left lower extremity [name]. No fevers, chills, nausea, or vomiting noted at this time. 12/08/18 on  evaluation today patient's wounds actually appears to be doing significantly better compared to his last evaluation. He has been using Santyl along with alternating tripling about appointment as well as the steroid cream seems to be doing [name], [name] ([id number]) quite well and the wound is showing signs of improvement which is excellent news. Fortunately there's no evidence of infection and in fact his culture came back negative with only normal skin flora noted. 12/21/2018 upon evaluation today patient actually appears to be doing excellent with regard to his [name]. This is actually the best that I have seen it since have been helping to take care of him. It is both smaller as well as less slough noted on the surface of the wound and seems to be showing signs of good improvement with new skin growing from the edges. He has been using just the triamcinolone he does wonder if he can get a refill of that ointment today. 01/04/2019 upon evaluation today patient actually appears to be doing well with regard to his left lateral lower extremity [name]. With that being said it does not appear to be that he is doing quite as well as last time as far as progression is concerned. There does not appear to be any signs of infection or significant irritation which is good news. With that being said I do believe that he may benefit from switching to a collagen based dressing based on how clean The wound appears. 01/18/2019 on evaluation today patient actually appears to be doing well with regard to his wound on the left lower extremity. He is not made a lot of progress compared to where we were previous but nonetheless does seem to be doing okay at this time which is good news. There is no signs of active infection which is also good news. My only concern currently is I do wish we can get him into utilizing the collagen dressing his insurance would not pay for the supplies that we ordered although it appears that he  may be able to order this through his supply company that he typically utilizes. This is [employer]. Nonetheless he did try to order it during the office visit today and it appears this did go through. We will see if he can get that it is a different brand but nonetheless he has collagen and I do think will be beneficial. 02/01/2019 on evaluation today patient actually appears to be doing a little worse today in regard  to the overall size of his wounds. Fortunately there is no signs of active infection at this time. That is visually. Nonetheless when this is happened before it was due to infection. For that reason were somewhat concerned about that this time as well. 02/08/2019 on evaluation today patient unfortunately appears to be doing slightly worse with regard to his wound upon evaluation today. Is measuring a little deeper and a little larger unfortunately. I am not really sure exactly what is causing this to enlarge he actually did see his dermatologist she is going to see about initiating Humira for him. Subsequently she also did do steroid injections into the wound itself in the periphery. Nonetheless still nonetheless he seems to be getting a little bit larger he is gone back to just using the steroid cream topically which I think is appropriate. I would say hold off on the collagen for the time being is definitely a good thing to do. Based on the culture results which we finally did get the final result back regarding it shows staph as the bacteria noted again that can be a normal skin bacteria based on the fact however he is having increased drainage and worsening of the wound measurement wise I would go ahead and place him on an antibiotic today I do believe for this. 02/15/2019 on evaluation today patient actually appears to be doing somewhat better in regard to his [name]. There is no signs of worsening at this time I did review his culture results which showed evidence of Staphylococcus  aureus but not MRSA. Again this could just be more related to the normal skin bacteria although he states the drainage has slowed down quite a bit he may have had a mild infection not just colonization. And was much smaller and then since around10/04/2019 on evaluation today patient appears to be doing unfortunately worse as far as the size of the wound. I really feel like that this is steadily getting larger again it had been doing excellent right at the beginning of September we have seen a steady increase in the area of the wound it is almost 2-1/2 times the size it was on September 1. Obviously this is a bad trend this is not wanting to see. For that reason we went back to using just the topical triamcinolone cream which does seem to help with inflammation. I checked him for bacteria by way of culture and nothing showed positive there. I am considering giving him a short course of a tapering steroid Dosepak today to see if that is can be beneficial for him. The patient is in agreement with giving that a try. 03/08/2019 on evaluation today patient appears to be doing very well in comparison to last evaluation with regard to his lower extremity [name]. This is showing signs of less inflammation and actually measuring slightly smaller compared to last time every other week over the past month and a half he has been measuring larger larger larger. Nonetheless I do believe that the issue has been inflammation the prednisone does seem to have been beneficial for him which is good news. No fevers, chills, nausea, vomiting, or diarrhea. 03/22/2019 on evaluation today patient appears to be doing about the same with regard to his leg [name]. He has been tolerating the dressing changes without complication. With that being said the wound seems to be mostly arrested at its current size but really is not making any progress except for when we prescribed the prednisone. He did show some  signs of dropping as  far as the overall size of the wound during that interval week. Nonetheless this is something he is not on long-term at this point and unfortunately I think he is getting need either this or else the Humira which his dermatologist has discussed try to get approval for. With that being said he will be seeing his dermatologist on the 11th of this month that is November. [name], [name] ([id number]) 04/19/2019 on evaluation today patient appears to be doing really about the same the wound is measuring slightly larger compared to last time I saw him. He has not been into the office since November 2 due to the fact that he unfortunately had Covid as that his entire family. He tells me that it was rough but they did pull-through and he seems to be doing much better. Fortunately there is no signs of active infection at this time. No fevers, chills, nausea, vomiting, or diarrhea. 05/10/2019 on evaluation today patient unfortunately appears to be doing significantly worse as compared to last time I saw him. He does tell me that he has had his first dose of Humira and actually is scheduled to get the next one in the upcoming week. With that being said he tells me also that in the past several days he has been having a lot of issues with green drainage she showed me a picture this is more blue-green in color. He is also been having issues with increased sloughy buildup and the wound does appear to be larger today. Obviously this is not the direction that we want everything to take based on the starting of his Humira. Nonetheless I think this is definitely a result of likely infection and to be honest I think this is probably Pseudomonas causing the infection based on what I am seeing. 05/24/2019 on evaluation today patient unfortunately appears to be doing significantly worse compared to his prior evaluation with me 2 weeks ago. I did review his culture results which showed that he does have Staph aureus as well as  Pseudomonas noted on the culture. Nonetheless the Levaquin that I prescribed for him does not appear to have been appropriate and in fact he tells me he is no longer experiencing the green drainage and discharge that he had at the last visit. Fortunately there is no signs of active infection at this time which is good news although the wound has significantly worsened it in fact is much deeper than it was previous. We have been utilizing up to this point triamcinolone ointment as the prescription topical of choice but at this time I really feel like that the wound is getting need to be packed in order to appropriately manage this due to the deeper nature of the wound. Therefore something along the lines of an alginate dressing may be more appropriate. 05/31/2019 upon inspection today patient's wound actually showed signs of doing poorly at this point. Unfortunately he just does not seem to be making any good progress despite what we have tried. He actually did go ahead and pick up the Cipro and start taking that as he was noticing more green drainage he had previously completed the Levaquin that I prescribed for him as well. Nonetheless he missed his appointment for the seventh last week on Wednesday with the wound care center and [facility] where his dermatologist referred him. Obviously I do think a second opinion would be helpful at this point especially in light of the fact that the patient  seems to be doing so poorly despite the fact that we have tried everything that I really know how at this point. The only thing that ever seems to have helped him in the past is when he was on high doses of continual steroids that did seem to make a difference for him. 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 06/07/2019 upon evaluation today  patient unfortunately appears to be doing worse yet again with regard to his wound. In fact I am starting to question whether or not he may have a fluid pocket in the muscle at this point based on the bulging and the soft appearance to the central portion of the muscle area. There is not anything draining from the muscle itself at this time which is good news but nonetheless the wound is expanding. I am not really seeing any results of the Humira as far as overall wound progression based on what I am seeing at this point. The patient has been referred for second opinion with regard to his wound to the [facility] wound care center by his dermatologist which I definitely am not in opposition to. Unfortunately we tried multiple dressings in the past including collagen, alginate, and at one point even Hydrofera Blue. With that being said he is never really used it for any significant amount of time due to the fact that he often complains of pain associated with these dressings and then will go back to either using the Santyl which she has done intermittently or more frequently the triamcinolone. He is also using his own compression stockings. We have wrapped him in the past but again that was something else that he really was not a big fan of. 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 He seemed to do much better when he was on the steroids although honestly I understand that the steroids are not necessarily the best medication to be on long-term obviously 06/14/2019 on evaluation today patient appears to be doing actually a little bit better with regard to the overall appearance with his  leg. Unfortunately he does continue to have issues with what appears to be some fluid underneath the muscle although he did see the wound specialty center at [facility] last week their main goals were to see about infusion therapy in place of the Humira as they feel like that is not quite strong enough. They also recommended that we continue with the treatment otherwise as we are they felt like that was appropriate and they are okay with him continuing to follow-up here with [place] in that regard. With that being said they are also sending him to the vein specialist there to see about vein stripping and if that would be of benefit for him. Subsequently they also did not really address whether or not an ultrasound of the muscle area to see if there is anything that needs to be addressed here would be appropriate or not. For that reason I  discussed this with him last week I think we may proceed down that road at this point. [name], [name] ([id number]) 06/21/2019 upon evaluation today patient's wound actually appears to be doing slightly better compared to previous evaluations. I do believe that he has made a difference with regard to the progression here with the use of oral steroids. Again in the past has been the only thing that is really calm things down. He does tell me that from [facility] is gotten a good news from there that there are no further vein stripping that is necessary at this point. I do not have that available for review today although the patient did relay this to me. He also did obtain and have the ultrasound of the wound completed which I did sign off on today. It does appear that there is no fluid collection under the muscle this is likely then just edematous tissue in general. That is also good news. Overall I still believe the inflammation is the main issue here. He did inquire about the possibility of a wound VAC again with the muscle protruding like it is I am not really sure whether the wound VAC  is necessarily ideal or not. That is something we will have to consider although I do believe he may need compression wrapping to try to help with edema control which could potentially be of benefit. Electronic Signature(s) Signed: 06/21/2019 1:07:10 PM By: Worthy Keeler PA-C Entered By: Worthy Keeler on 06/21/2019 13:07:10 [name], HARMES ([id number]) -------------------------------------------------------------------------------- Physical Exam Details Patient Name: [name] Date of Service: 06/21/2019 8:15 AM Medical Record Number: [id number] Patient Account Number: [id number] Date of Birth/Sex: December 30, 1980 (39 y.o. M) Treating RN: [name] Primary Care Provider: [name] Other Clinician: Referring Provider: [name] Treating Provider/Extender: Melburn Hake, Chryl Holten Weeks in Treatment: 70 Constitutional Well-nourished and well-hydrated in no acute distress. Respiratory normal breathing without difficulty. Psychiatric this patient is able to make decisions and demonstrates good insight into disease process. Alert and Oriented x 3. pleasant and cooperative. Notes Patient's wound bed currently again showed signs of really no significant irritation of the periwound he did have some discolored drainage that has some concerned about infection therefore I did obtain a wound culture today. Depend on the results of the culture I may place him back on antibiotic ends on how he does over the next week and what the wound culture shows. He just completed initially a course of Levaquin followed by Cipro and that just stopped 2 days ago. Electronic Signature(s) Signed: 06/21/2019 1:07:41 PM By: Worthy Keeler PA-C Entered By: Worthy Keeler on 06/21/2019 13:07:41 [name], [name] ([id number]) -------------------------------------------------------------------------------- Physician Orders Details Patient Name: [name] Date of Service: 06/21/2019 8:15 AM Medical Record Number:  [id number] Patient Account Number: [id number] Date of Birth/Sex: 04/06/1981 (39 y.o. 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 Anesthetic (add to Medication List) Wound #1 Left,Lateral Lower Leg o Topical Lidocaine 4% cream applied to wound bed prior to debridement (In Clinic Only). Skin Barriers/Peri-Wound Care Wound #1 Left,Lateral Lower Leg o Triamcinolone Acetonide Ointment (TCA) Primary Wound Dressing o Dry Gauze Secondary  Dressing Wound #1 Left,Lateral Lower Leg o Boardered Foam Dressing Dressing Change Frequency Wound #1 Left,Lateral Lower Leg o Change dressing every other day. Follow-up Appointments Wound #1 Left,Lateral Lower Leg o Return Appointment in 1 week. Edema Control Wound #1 Left,Lateral Lower Leg o Elevate legs to the level of the heart and pump ankles as often as possible Laboratory o Bacteria identified in Wound by Culture (MICRO) - Left leg [name], [name] ([id number]) oooo [name] oooo Convenience Name: Wound culture routine Electronic Signature(s) Signed: 06/23/2019 1:07:50 AM By: Worthy Keeler PA-C Signed: 06/24/2019 5:22:08 PM By: [name], [name], RN, [name], [name], [name] Previous Signature: 06/21/2019 4:06:16 PM Version By: Worthy Keeler PA-C Entered By: [name], [name], RN, [name], [name] on 06/22/2019 08:17:18 [name], [name] ([id number]) -------------------------------------------------------------------------------- Problem List Details Patient Name: [name] Date of Service: 06/21/2019 8:15 AM Medical Record Number: [id number] Patient Account Number: [id number] Date of Birth/Sex: [birth year] (39 y.o. 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 12/04/2016 No Yes L88 Pyoderma gangrenosum 03/26/2017 No Yes I87.2 Venous insufficiency (chronic) (peripheral) 12/04/2016 No Yes L03.116 Cellulitis of left lower limb 05/24/2019 No Yes Inactive Problems ICD-10 Code Description Active Date Inactive Date 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 M) Treating RN: [name] Primary Care Provider: [name] Other Clinician: Referring Provider: [name] Treating Provider/Extender: Melburn Hake, Bernell Sigal Weeks in Treatment: 132 Subjective Chief Complaint Information obtained from Patient He is here in follow up evaluation for LLE pyoderma [name] History of Present Illness (HPI) 12/04/16; 39 year old man who comes into the clinic today for review of a wound on the posterior left calf. He tells me that is been there for about a year. He is not a diabetic he does smoke half a pack per day. He was seen in the ER on 11/20/16 felt to have cellulitis around the wound  and was given clindamycin. An x-ray did not show osteomyelitis. The patient initially tells me that he has a milk allergy that sets off a pruritic itching rash on his lower legs which she scratches incessantly and he thinks that's what may have set up the wound. He has been using various topical antibiotics and ointments without any effect. He works in a [employer] and is on his feet all day. He does not have a prior history of wounds however he does have the rash on both lower legs the right arm and the ventral aspect of his left arm. These are excoriations and clearly have had scratching however there are of macular looking areas on both legs including a substantial larger area on the right leg. This does not have an underlying open area. There is no blistering. The patient tells me that 2 years ago in [place] in response to the rash on his legs he saw a dermatologist who told him he had a condition which may be pyoderma gangrenosum although I may be putting words into his mouth. He  seemed to recognize this. 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 He is not in any treatment for this. He's had no recent travel 12/11/16; the patient arrives today with his wound and roughly the same condition we've been using silver alginate this is a deep punched out wound with some surrounding erythema but no tenderness. Biopsy I did did not show confirmed pyoderma gangrenosum suggested nonspecific inflammation and vasculitis but does not provide an actual description of what was seen by the pathologist. I'm really not able to understand this We have also received information from the patient's dermatologist in [place] notes from April 2016. This was a doctor Agarwal- antal. 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 He was prescribed topical steroid high potency under occlusion which helped but at this point the patient did not have a deep punched out  wound. 12/18/16; the patient's wound is larger in terms of surface area however this surface looks better and there is less depth. The surrounding erythema also is better. The patient states that the wrap we put on came off 2 days ago when he has been using his compression stockings. He we are in the process of getting a dermatology consult. 12/26/16 on evaluation today patient's left lower extremity wound shows evidence of infection with surrounding erythema noted. He has been tolerating the dressing changes but states that he has noted more discomfort. There is a larger area of erythema surrounding the wound. No fevers, chills, nausea, or vomiting noted at this time. With that being said the wound still does have slough covering the surface. He is not allergic to any medication that he is aware of at this point. In regard to his right lower extremity he had several regions that are erythematous and pruritic he wonders if there's anything we can do to help that. 01/02/17 I reviewed patient's wound culture which was obtained his visit last week. He was placed on doxycycline at that point. Unfortunately that does not appear to be an antibiotic that would likely help with the situation however the pseudomonas noted on culture is sensitive to Cipro. Also unfortunately patient's wound seems to have a large compared to last week's evaluation. Not severely so but there are definitely increased measurements in general. He is continuing to have discomfort as well he writes this to be a seven out of 10. In fact he would prefer me not to perform any debridement today due to the fact that he is having discomfort and considering he has an active infection on the little reluctant to do so anyway. No fevers, Fullbright, [place] ([id number]) chills, nausea, or vomiting noted at this time. 01/08/17; patient seems dermatology on September 5. I suspect dermatology will want the slides from the biopsy I did sent to their  pathologist. I'm not sure if there is a way we can expedite that. In any case the culture I did before I left on vacation 3 weeks ago showed Pseudomonas he was given 10 days of Cipro and per her description of her intake nurses is actually somewhat better this week although the wound is quite a bit bigger than I remember the last time I saw this. He still has 3 more days of Cipro 01/21/17; dermatology appointment tomorrow. He has completed the ciprofloxacin for Pseudomonas. Surface of the wound looks better however he is had some deterioration in the lesions on his right leg. Meantime the left lateral leg wound we will continue with sample  01/29/17; patient had his dermatology appointment but I can't yet see that note. He is completed his antibiotics. The wound is more superficial but considerably larger in circumferential area than when he came in. This is in his left lateral calf. He also has swollen erythematous areas with superficial wounds on the right leg and small papular areas on both arms. There apparently areas in her his upper thighs and buttocks I did not look at those. Dermatology biopsied the right leg. Hopefully will have their input next week. 02/05/17; patient went back to see his dermatologist who told him that he had a "scratching problem" as well as staph. He is now on a 30 day course of doxycycline and I believe she gave him triamcinolone cream to the right leg areas to help with the itching [not exactly sure but probably triamcinolone]. She apparently looked at the left lateral leg wound although this was not rebiopsied and I think felt to be ultimately part of the same pathogenesis. He is using sample border foam and changing nevus himself. He now has a new open area on the right posterior leg which was his biopsy site I don't have any of the dermatology notes 02/12/17; we put the patient in compression last week with SANTYL to the wound on the left leg and the biopsy. Edema  is much better and the depth of the wound is now at level of skin. Area is still the same Biopsy site on the right lateral leg we've also been using santyl with a border foam dressing and he is changing this himself. 02/19/17; Using silver alginate started last week to both the substantial left leg wound and the biopsy site on the right wound. He is tolerating compression well. Has a an appointment with his primary M.D. tomorrow wondering about diuretics although I'm wondering if the edema problem is actually lymphedema 02/26/17; the patient has been to see his primary doctor Dr. Jerrel Ivory at [facility] our primary care. She started him on Lasix 20 mg and this seems to have helped with the edema. However we are not making substantial change with the left lateral calf wound and inflammation. The biopsy site on the right leg also looks stable but not really all that different. 03/12/17; the patient has been to see vein and vascular Dr. Lucky Cowboy. He has had venous reflux studies I have not reviewed these. I did get a call from his dermatology office. 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 They therefore recommended clobetasol and tetracycline i.e. 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 03/26/17; apparently the patient just had reflux studies not an appointment with Dr. dew. She arrives in clinic today having applied clobetasol for 2-3 weeks. He notes over the last 2-3 days excessive drainage having to change the dressing 3-4 times a day and also expanding erythema. He states the expanding erythema seems to come and go and was last this red was earlier in the month.he is on doxycycline 150 mg twice a day as an  anti-inflammatory systemic therapy for possible pyoderma gangrenosum along with the topical clobetasol 04/02/17; the patient was seen last week by Dr. Lillia Carmel at [facility] dermatology locally who kindly saw him at my request. 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 My concern was the degree of erythema medially extending from his left leg wound which was either inflammation from pyoderma or cellulitis. I put him on Augmentin however culture of the wound showed Pseudomonas which is quinolone sensitive. I really don't believe he has cellulitis however in view of everything I will continue and give him a course of Cipro. He is also on doxycycline as an immune modulator for the pyoderma. In addition to his original wound on the left lateral leg with surrounding erythema he has a wound on the right posterior calf which was an original biopsy site done by dermatology. 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 Saw Dr. Lillia Carmel yesterday. He has been using topical antibiotics to both wound areas his original wound on the left and the biopsies/pathergy area on the right. There is definitely some improvement in the inflammation around the wound on the right although the patient states he has increasing sensitivity of the wounds. He is on prednisone 60 and doxycycline 1 as prescribed by Dr. Lillia Carmel. He is covering the topical antibiotic with gauze and putting this in his own compression stocks and changing this daily. He states that Dr. Lottie Rater did a culture of the left leg wound yesterday 05/07/17; pyoderma gangrenosum. The patient saw Dr. Lillia Carmel yesterday and has a follow-up with her in one month. He is still using topical antibiotics to both wounds although he can't recall exactly what type. He is still on prednisone 60 mg. Dr. Lillia Carmel stated that the doxycycline could stop if  we were in agreement. 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 The right medial leg was induced by [name] ([id number]) biopsy/pathergy. The area on the right is essentially healed. 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 The right medial leg has closed and remains closed. He is still on prednisone 60. He tells me he missed his last dermatology appointment with Dr. Lillia Carmel but will make another appointment. He reports that her blood sugar at a recent screen in [place] was high 200's. He was 180 today. 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 I think he is going to require still much longer prednisone perhaps another 3 months before attempting to taper. In the meantime his wound is a lot better. Smaller. He is cleaning this off daily and applying topical antibiotics. When he was last in the clinic I thought about changing to [facility] and actually put in a couple of calls to dermatology although probably not during their business hours. In any case the wound looks better smaller I don't think there is any need to change what he is doing 08/06/17-he is here in follow up evaluation for pyoderma left leg [name]. He continues on oral prednisone. He has been using triple antibiotic ointment. There is surface debris and we will transition to [facility] and have him return in 2 weeks. He has lost 30 pounds since his last appointment with lifestyle modification. He may benefit from topical steroid cream for treatment this can be considered at a later date. 08/22/17 on evaluation today patient appears to actually be doing rather well in regard to his left lateral lower extremity [name]. He has actually been managed by Dr. Dellia Nims most recently. Patient is  currently on oral steroids at this time. This  seems to have been of benefit for him. Nonetheless his last visit was actually with Leah on 08/06/17. Currently he is not utilizing any topical steroid creams although this could be of benefit as well. No fevers, chills, nausea, or vomiting noted at this time. 09/05/17 on evaluation today patient appears to be doing better in regard to his left lateral lower extremity [name]. He has been tolerating the dressing changes without complication. He is using Santyl with good effect. Overall I'm very pleased with how things are standing at this point. Patient likewise is happy that this is doing better. 09/19/17 on evaluation today patient actually appears to be doing rather well in regard to his left lateral lower extremity [name]. 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 He's not having any significant pain. 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 He has been tolerating the Santyl without complication and in general I feel like he's making good progress. 10/17/17 on evaluation today patient appears to be doing very well in regard to his left lateral lower surety [name]. He has been tolerating the dressing changes without complication. There does not appear to be any evidence of infection he's alternating the Santyl and the triple antibiotic ointment every other day this seems to be doing well for him. 11/03/17 on evaluation today patient appears to be doing very well in regard to his left lateral lower extremity [name]. He is been tolerating the dressing changes without complication which is good news. Fortunately there does not appear to be any evidence of infection which is also great news. Overall is doing excellent they are starting to taper down on the prednisone is down to 40 mg at this point it also started topical  clobetasol for him. 11/17/17 on evaluation today patient appears to be doing well in regard to his left lateral lower surety [name]. He's been tolerating the dressing changes without complication. He does note that he is having no pain, no excessive drainage or discharge, and overall he feels like things are going about how he would expect and hope they would. Overall he seems to have no evidence of infection at this time in my opinion which is good news. 12/04/17-He is seen in follow-up evaluation for right lateral lower extremity [name]. He has been applying topical steroid cream. Today's measurement show slight increase in size. Over the next 2 weeks we will transition to every other day Santyl and steroid cream. He has been encouraged to monitor for changes and notify clinic with any concerns 12/15/17 on evaluation today patient's left lateral motion the [name] and fortunately is doing worse again at this point. This just since last week to this week has close to doubled in size according to the patient. I did not seeing last week's I do not have a visual to compare this to in our system was also down so we do not have all the charts and at this point. Nonetheless it does have me somewhat concerned in regard to the fact that again he was worried enough about it he has contact the dermatology that placed them back on the full strength, 50 mg a day of the prednisone that he was taken previous. He continues to alternate using clobetasol along with Santyl at this point. He is  obviously somewhat frustrated. 12/22/17 on evaluation today patient appears to be doing a little worse compared to last evaluation. Unfortunately the wound is a little deeper and slightly larger than the last week's evaluation. With that being said he has made some progress in regard to the irritation surrounding at this time unfortunately despite that progress that's been made he still has a significant issue going on here. I'm not  certain that he is having really any true infection at this time although with the Pyoderma gangrenosum it can Zappone, Lanard ([id number]) sometimes be difficult to differentiate infection versus just inflammation. For that reason I discussed with him today the possibility of perform a wound culture to ensure there's nothing overtly infected. 01/06/18 on evaluation today patient's wound is larger and deeper than previously evaluated. With that being said it did appear that his wound was infected after my last evaluation with him. Subsequently I did end up prescribing a prescription for Bactrim DS which she has been taking and having no complication with. Fortunately there does not appear to be any evidence of infection at this point in time as far as anything spreading, no want to touch, and overall I feel like things are showing signs of improvement. 01/13/18 on evaluation today patient appears to be even a little larger and deeper than last time. There still muscle exposed in the base of the wound. Nonetheless he does appear to be less erythematous I do believe inflammation is calming down also believe the infection looks like it's probably resolved at this time based on what I'm seeing. No fevers, chills, nausea, or vomiting noted at this time. 01/30/18 on evaluation today patient actually appears to visually look better for the most part. Unfortunately those visually this looks better he does seem to potentially have what may be an abscess in the muscle that has been noted in the central portion of the wound. This is the first time that I have noted what appears to be fluctuance in the central portion of the muscle. With that being said I'm somewhat more concerned about the fact that this might indicate an abscess formation at this location. I do believe that an ultrasound would be appropriate. This is likely something we need to try to do as soon as possible. He has been switch to mupirocin  ointment and he is no longer using the steroid ointment as prescribed by dermatology he sees them again next week he's been decreased from 60 to 40 mg of prednisone. 03/09/18 on evaluation today patient actually appears to be doing a little better compared to last time I saw him. There's not as much erythema surrounding the wound itself. He I did review his most recent infectious disease note which was dated 02/24/18. He saw Dr. Michel Bickers in [place]. 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 Patient is now off of antibiotics and they are continually observing this. There seems to be no change in the past two weeks in my pinion based on what the patient says and what I see today compared to what Dr. Megan Salon likely saw two weeks ago. No fevers, chills, nausea, or vomiting noted at this time. 03/23/18 on evaluation today patient's wound actually appears to be showing signs of improvement which is good news. He is currently still on the Dapsone. He is also working on tapering the prednisone to get off of this and Dr.  Perlstein is working with him in this regard. Nonetheless overall I feel like the wound is doing well it does appear based on the infectious disease note that I reviewed from Dr. 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 I did also review the results of his ultrasound of left lower extremity which revealed there was a dentist tissue in the base of the wound without an abscess noted. 04/06/18 on evaluation today the patient's left lateral lower extremity [name] actually appears to be doing fairly well which is excellent news. There does not appear to be any evidence of infection at this time which is also great news. Overall he still does have a significantly large ulceration although  little by little he seems to be making progress. He is down to 10 mg a day of the prednisone. 04/20/18 on evaluation today patient actually appears to be doing excellent at this time in regard to his left lower extremity [name]. He's making signs of good progress unfortunately this is taking much longer than we would really like to see but nonetheless he is making progress. Fortunately there does not appear to be any evidence of infection at this time. No fevers, chills, nausea, or vomiting noted at this time. The patient has not been using the Santyl due to the cost he hadn't got in this field yet. He's mainly been using the antibiotic ointment topically. Subsequently he also tells me that he really has not been scrubbing in the shower I think this would be helpful again as I told him it doesn't have to be anything too aggressive to even make it believe just enough to keep it free of some of the loose slough and biofilm on the wound surface. 05/11/18 on evaluation today patient's wound appears to be making slow but sure progress in regard to the left lateral lower extremity [name]. He is been tolerating the dressing changes without complication. Fortunately there does not appear to be any evidence of infection at this time. He is still just using triple antibiotic ointment along with clobetasol occasionally over the area. He never got the Santyl and really does not seem to intend to in my pinion. 06/01/18 on evaluation today patient appears to be doing a little better in regard to his left lateral lower extremity [name]. He states that overall he does not feel like he is doing as well with the Dapsone as he did with the prednisone. Nonetheless he sees his dermatologist later today and is gonna talk to them about the possibility of going back on the prednisone. Overall again I believe that the wound would be better if you would utilize Santyl but he really does not seem to be interested in going back to  the [name] at this point. He has been using triple antibiotic ointment. FLOY, RIEGLER ([id number]) 06/15/18 on evaluation today patient's wound actually appears to be doing about the same at this point. Fortunately there is no signs of infection at this time. He has made slight improvements although he continues to not really want to clean the wound bed at this point. He states that he just doesn't mess with it he doesn't want to cause any problems with everything else he has going on. He has been on medication, antibiotics as prescribed by his dermatologist, for a staff infection of his lower extremities which is really drying out now and looking  much better he tells me. Fortunately there is no sign of overall infection. 06/29/18 on evaluation today patient appears to be doing well in regard to his left lateral lower surety [name] all things considering. Fortunately his staff infection seems to be greatly improved compared to previous. He has no signs of infection and this is drying up quite nicely. He is still the doxycycline for this is no longer on cental, Dapsone, or any of the other medications. His dermatologist has recommended possibility of an infusion but right now he does not want to proceed with that. 07/13/18 on evaluation today patient appears to be doing about the same in regard to his left lateral lower surety [name]. Fortunately there's no signs of infection at this time which is great news. Unfortunately he still builds up a significant amount of Slough/biofilm of the surface of the wound he still is not really cleaning this as he should be appropriately. Again I'm able to easily with saline and gauze remove the majority of this on the surface which if you would do this at home would likely be a dramatic improvement for him as far as getting the area to improve. Nonetheless overall I still feel like he is making progress is just very slow. I think Santyl will be of benefit for him as  well. Still he has not gotten this as of this point. 07/27/18 on evaluation today patient actually appears to be doing little worse in regards of the erythema around the periwound region of the wound he also tells me that he's been having more drainage currently compared to what he was experiencing last time I saw him. He states not quite as bad as what he had because this was infected previously but nonetheless is still appears to be doing poorly. Fortunately there is no evidence of systemic infection at this point. The patient tells me that he is not going to be able to afford the Santyl. He is still waiting to hear about the infusion therapy with his dermatologist. Apparently she wants an updated colonoscopy first. 08/10/18 on evaluation today patient appears to be doing better in regard to his left lateral lower extremity [name]. Fortunately he is showing signs of improvement in this regard he's actually been approved for Remicade infusion's as well although this has not been scheduled as of yet. Fortunately there's no signs of active infection at this time in regard to the wound although he is having some issues with infection of the right lower extremity is been seen as dermatologist for this. Fortunately they are definitely still working with him trying to keep things under control. 09/07/18 on evaluation today patient is actually doing rather well in regard to his left lateral lower extremity [name]. He notes these actually having some hair grow back on his extremity which is something he has not seen in years. He also tells me that the pain is really not giving them any trouble at this time which is also good news overall she is very pleased with the progress he's using a combination of the mupirocin along with the probate is all mixed. 09/21/18 on evaluation today patient actually appears to be doing fairly well all things considered in regard to his looks from the [name]. He's been tolerating the  dressing changes without complication. Fortunately there's no signs of active infection at this time which is good news he is still on all antibiotics or prevention of the staff infection. He has been on prednisone for time although  he states it is gonna contact his dermatologist and see if she put them on a short course due to some irritation that he has going on currently. Fortunately there's no evidence of any overall worsening this is going very slow I think cental would be something that would be helpful for him although he states that $50 for tube is quite expensive. He therefore is not willing to get that at this point. 10/06/18 on evaluation today patient actually appears to be doing decently well in regard to his left lateral leg [name]. He's been tolerating the dressing changes without complication. Fortunately there's no signs of active infection at this time. Overall I'm actually rather pleased with the progress he's making although it's slow he doesn't show any signs of infection and he does seem to be making some improvement. I do believe that he may need a switch up and dressings to try to help this to heal more appropriately and quickly. 10/19/18 on evaluation today patient actually appears to be doing better in regard to his left lateral lower extremity [name]. This is shown signs of having much less Slough buildup at this point due to the fact he has been using the [employer]. Obviously this is very good news. The overall size of the wound is not dramatically smaller but again the appearance is. 11/02/18 on evaluation today patient actually appears to be doing quite well in regard to his lower [name] [name]. A lot of the skin around the [name] is actually somewhat irritating at this point this seems to be more due to the dressing causing irritation from the adhesive that anything else. Fortunately there is no signs of active infection at this time. 11/24/18 on evaluation today patient appears  to be doing a little worse in regard to his overall appearance of his lower extremity Hermiz, Jarrel ([id number]) [name]. There's more erythema and warmth around the wound unfortunately. He is currently on doxycycline which he has been on for some time. With that being said I'm not sure that seems to be helping with what appears to possibly be an acute cellulitis with regard to his left lower extremity [name]. No fevers, chills, nausea, or vomiting noted at this time. 12/08/18 on evaluation today patient's wounds actually appears to be doing significantly better compared to his last evaluation. He has been using Santyl along with alternating tripling about appointment as well as the steroid cream seems to be doing quite well and the wound is showing signs of improvement which is excellent news. Fortunately there's no evidence of infection and in fact his culture came back negative with only normal skin flora noted. 12/21/2018 upon evaluation today patient actually appears to be doing excellent with regard to his [name]. This is actually the best that I have seen it since have been helping to take care of him. It is both smaller as well as less slough noted on the surface of the wound and seems to be showing signs of good improvement with new skin growing from the edges. He has been using just the triamcinolone he does wonder if he can get a refill of that ointment today. 01/04/2019 upon evaluation today patient actually appears to be doing well with regard to his left lateral lower extremity [name]. With that being said it does not appear to be that he is doing quite as well as last time as far as progression is concerned. There does not appear to be any signs of infection or significant irritation which is good  news. With that being said I do believe that he may benefit from switching to a collagen based dressing based on how clean The wound appears. 01/18/2019 on evaluation today patient actually appears to  be doing well with regard to his wound on the left lower extremity. He is not made a lot of progress compared to where we were previous but nonetheless does seem to be doing okay at this time which is good news. There is no signs of active infection which is also good news. My only concern currently is I do wish we can get him into utilizing the collagen dressing his insurance would not pay for the supplies that we ordered although it appears that he may be able to order this through his supply company that he typically utilizes. This is [employer]. Nonetheless he did try to order it during the office visit today and it appears this did go through. We will see if he can get that it is a different brand but nonetheless he has collagen and I do think will be beneficial. 02/01/2019 on evaluation today patient actually appears to be doing a little worse today in regard to the overall size of his wounds. Fortunately there is no signs of active infection at this time. That is visually. Nonetheless when this is happened before it was due to infection. For that reason were somewhat concerned about that this time as well. 02/08/2019 on evaluation today patient unfortunately appears to be doing slightly worse with regard to his wound upon evaluation today. Is measuring a little deeper and a little larger unfortunately. I am not really sure exactly what is causing this to enlarge he actually did see his dermatologist she is going to see about initiating Humira for him. Subsequently she also did do steroid injections into the wound itself in the periphery. Nonetheless still nonetheless he seems to be getting a little bit larger he is gone back to just using the steroid cream topically which I think is appropriate. I would say hold off on the collagen for the time being is definitely a good thing to do. Based on the culture results which we finally did get the final result back regarding it shows staph as the  bacteria noted again that can be a normal skin bacteria based on the fact however he is having increased drainage and worsening of the wound measurement wise I would go ahead and place him on an antibiotic today I do believe for this. 02/15/2019 on evaluation today patient actually appears to be doing somewhat better in regard to his [name]. There is no signs of worsening at this time I did review his culture results which showed evidence of Staphylococcus aureus but not MRSA. Again this could just be more related to the normal skin bacteria although he states the drainage has slowed down quite a bit he may have had a mild infection not just colonization. And was much smaller and then since around10/04/2019 on evaluation today patient appears to be doing unfortunately worse as far as the size of the wound. I really feel like that this is steadily getting larger again it had been doing excellent right at the beginning of September we have seen a steady increase in the area of the wound it is almost 2-1/2 times the size it was on September 1. Obviously this is a bad trend this is not wanting to see. For that reason we went back to using just the topical triamcinolone cream which does seem  to help with inflammation. I checked him for bacteria by way of culture and nothing showed positive there. I am considering giving him a short course of a tapering steroid Dosepak today to see if that is can be beneficial for him. The patient is in agreement with giving that a try. 03/08/2019 on evaluation today patient appears to be doing very well in comparison to last evaluation with regard to his lower extremity [name]. This is showing signs of less inflammation and actually measuring slightly smaller compared to last time every other week over the past month and a half he has been measuring larger larger larger. Nonetheless I do believe that the issue has been inflammation the prednisone does seem to have been  beneficial for him which is good news. No fevers, chills, nausea, vomiting, or diarrhea. EVERETTE, MALL ([id number]) 03/22/2019 on evaluation today patient appears to be doing about the same with regard to his leg [name]. He has been tolerating the dressing changes without complication. With that being said the wound seems to be mostly arrested at its current size but really is not making any progress except for when we prescribed the prednisone. He did show some signs of dropping as far as the overall size of the wound during that interval week. Nonetheless this is something he is not on long-term at this point and unfortunately I think he is getting need either this or else the Humira which his dermatologist has discussed try to get approval for. With that being said he will be seeing his dermatologist on the 11th of this month that is November. 04/19/2019 on evaluation today patient appears to be doing really about the same the wound is measuring slightly larger compared to last time I saw him. He has not been into the office since November 2 due to the fact that he unfortunately had Covid as that his entire family. He tells me that it was rough but they did pull-through and he seems to be doing much better. Fortunately there is no signs of active infection at this time. No fevers, chills, nausea, vomiting, or diarrhea. 05/10/2019 on evaluation today patient unfortunately appears to be doing significantly worse as compared to last time I saw him. He does tell me that he has had his first dose of Humira and actually is scheduled to get the next one in the upcoming week. With that being said he tells me also that in the past several days he has been having a lot of issues with green drainage she showed me a picture this is more blue-green in color. He is also been having issues with increased sloughy buildup and the wound does appear to be larger today. Obviously this is not the direction that we want  everything to take based on the starting of his Humira. Nonetheless I think this is definitely a result of likely infection and to be honest I think this is probably Pseudomonas causing the infection based on what I am seeing. 05/24/2019 on evaluation today patient unfortunately appears to be doing significantly worse compared to his prior evaluation with me 2 weeks ago. I did review his culture results which showed that he does have Staph aureus as well as Pseudomonas noted on the culture. Nonetheless the Levaquin that I prescribed for him does not appear to have been appropriate and in fact he tells me he is no longer experiencing the green drainage and discharge that he had at the last visit. Fortunately there is no signs of  active infection at this time which is good news although the wound has significantly worsened it in fact is much deeper than it was previous. We have been utilizing up to this point triamcinolone ointment as the prescription topical of choice but at this time I really feel like that the wound is getting need to be packed in order to appropriately manage this due to the deeper nature of the wound. Therefore something along the lines of an alginate dressing may be more appropriate. 05/31/2019 upon inspection today patient's wound actually showed signs of doing poorly at this point. Unfortunately he just does not seem to be making any good progress despite what we have tried. He actually did go ahead and pick up the Cipro and start taking that as he was noticing more green drainage he had previously completed the Levaquin that I prescribed for him as well. Nonetheless he missed his appointment for the seventh last week on Wednesday with the wound care center and [facility] where his dermatologist referred him. Obviously I do think a second opinion would be helpful at this point especially in light of the fact that the patient seems to be doing so poorly despite the fact that  we have tried everything that I really know how at this point. The only thing that ever seems to have helped him in the past is when he was on high doses of continual steroids that did seem to make a difference for him. 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 06/07/2019 upon evaluation today patient unfortunately appears to be doing worse yet again with regard to his wound. In fact I am starting to question whether or not he may have a fluid pocket in the muscle at this point based on the bulging and the soft appearance to the central portion of the muscle area. There is not anything draining from the muscle itself at this time which is good news but nonetheless the wound is expanding. I am not really seeing any results of the Humira as far as overall wound progression based on what I am seeing at this point. The patient has been referred for second opinion with regard to his wound to the [facility] wound care center by his dermatologist which I definitely am not in opposition to. Unfortunately we tried multiple dressings in the past including collagen, alginate, and at one point even Hydrofera Blue. With that being said he is never really used it for any significant amount of time due to the fact that he often complains of pain associated with these dressings and then will go back to either using the Santyl which she has done intermittently or more frequently the triamcinolone. He is also using his own compression stockings. We have wrapped him in the past but again that was something else that he really was not a big fan of. Nonetheless he may need more direct compression in regard to the wound but right now I do not see any signs of infection in fact he has been treated for the most recent infection and I do not believe that is likely the cause  of his issues either I really feel like that it may just be potentially that Humira is not really treating the underlying pyoderma gangrenosum. He seemed to do much better  when he was on the steroids although honestly I understand that the steroids are not necessarily the best medication to be on long-term obviously 06/14/2019 on evaluation today patient appears to be doing actually a little bit better with regard to the overall appearance with his leg. Unfortunately he does continue to have issues with what appears to be some fluid underneath the muscle although he did see the wound specialty center at [facility] last week their main goals were to see about infusion therapy in place of the Humira as they feel like that is not quite strong enough. They also recommended that we continue with the treatment Wiegert, Derin ([id number]) otherwise as we are they felt like that was appropriate and they are okay with him continuing to follow-up here with [place] in that regard. With that being said they are also sending him to the vein specialist there to see about vein stripping and if that would be of benefit for him. Subsequently they also did not really address whether or not an ultrasound of the muscle area to see if there is anything that needs to be addressed here would be appropriate or not. For that reason I discussed this with him last week I think we may proceed down that road at this point. 06/21/2019 upon evaluation today patient's wound actually appears to be doing slightly better compared to previous evaluations. I do believe that he has made a difference with regard to the progression here with the use of oral steroids. Again in the past has been the only thing that is really calm things down. He does tell me that from [facility] is gotten a good news from there that there are no further vein stripping that is necessary at this point. I do not have that available for review today although the patient did relay this  to me. He also did obtain and have the ultrasound of the wound completed which I did sign off on today. It does appear that there is no fluid collection under the muscle this is likely then just edematous tissue in general. That is also good news. Overall I still believe the inflammation is the main issue here. He did inquire about the possibility of a wound VAC again with the muscle protruding like it is I am not really sure whether the wound VAC is necessarily ideal or not. That is something we will have to consider although I do believe he may need compression wrapping to try to help with edema control which could potentially be of benefit. Objective Constitutional Well-nourished and well-hydrated in no acute distress. Vitals Time Taken: 8:15 AM, Height: 71 in, Weight: 338 lbs, BMI: 47.1, Temperature: 98.8 F, Pulse: 84 bpm, Respiratory Rate: 16 breaths/min, Blood Pressure: 137/80 mmHg. Respiratory normal breathing without difficulty. Psychiatric this patient is able to make decisions and demonstrates good insight into disease process. Alert and Oriented x 3. pleasant and cooperative. General Notes: Patient's wound bed currently again showed signs of really no significant irritation of the periwound he did have some discolored drainage that has some concerned about infection therefore I did obtain a wound culture today. Depend on the results of the culture I may place him back on antibiotic ends on how he does over the next week and what the wound culture shows. He just completed initially a course of Levaquin followed by Cipro and that just stopped 2 days ago. Integumentary (Hair, Skin) Wound #1 status is Open. Original cause of wound was Gradually Appeared. The wound  is located on the Left,Lateral Lower Leg. The wound measures 5.6cm length x 5.5cm width x 1cm depth; 24.19cm^2 area and 24.19cm^3 volume. There is muscle and Fat Layer (Subcutaneous Tissue) Exposed exposed. There is a medium  amount of serous drainage noted. The wound margin is distinct with the outline attached to the wound base. There is medium (34-66%) pink granulation within the wound bed. There is a medium (34-66%) amount of necrotic tissue within the wound bed including Adherent Slough. 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 Anesthetic (add to Medication List): Wound #1 Left,Lateral Lower Leg: Topical Lidocaine 4% cream applied to wound bed prior to debridement (In Clinic Only). Skin Barriers/Peri-Wound Care: Wound #1 Left,Lateral Lower Leg: Triamcinolone Acetonide Ointment (TCA) Primary Wound Dressing: Dry Gauze Secondary Dressing: Wound #1 Left,Lateral Lower Leg: Boardered Foam Dressing Dressing Change Frequency: Wound #1 Left,Lateral Lower Leg: Change dressing every other day. Follow-up Appointments: Wound #1 Left,Lateral Lower Leg: Return Appointment in 1 week. Edema Control: Wound #1 Left,Lateral Lower Leg: Elevate legs to the level of the heart and pump ankles as often as possible 1. My suggestion at this time is good to be that we continue with the current wound care measures which includes the triamcinolone. We have tried other things for the patient has not really tolerated this. I would like to potentially try using a compression wrap although we will get a consider that for next time after the results of the culture return. We will see him in 1 week. 2. In the meantime I am going to suggest he continue wearing compression stocking also think he needs to elevate his legs much as possible I do believe edema is part of the issue although it does not sound like vein stripping or further venous procedures will be of benefit  for him. 3. Patient can continue to work when he is home and wanting to elevate his legs is much as possible. We will see patient back for reevaluation in 1 week here in the clinic. If anything worsens or changes patient will contact our office for additional recommendations. Electronic Signature(s) MADS, BORGMEYER ([id number]) Signed: 06/21/2019 1:08:39 PM By: Worthy Keeler PA-C Entered By: Worthy Keeler on 06/21/2019 13:08:39 BONNY, EGGER ([id number]) -------------------------------------------------------------------------------- SuperBill Details Patient Name: [name] Date of Service: 06/21/2019 Medical Record Number: [id number] Patient Account Number: [id number] Date of Birth/Sex: [birth year] (39 y.o. 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 L88 Pyoderma gangrenosum I87.2 Venous insufficiency (chronic) (peripheral) L03.116 Cellulitis of left lower limb Facility Procedures CPT4 Code: [id number] Description: 98921 - WOUND CARE VISIT-LEV 3 EST PT Modifier: Quantity: 1 Physician Procedures CPT4 Code: 1941740 Description: 81448 - WC PHYS LEVEL 4 - EST PT ICD-10 Diagnosis Description L97.222 Non-pressure chronic [name] of left calf with fat layer expo L88 Pyoderma gangrenosum I87.2 Venous insufficiency (chronic) (peripheral) L03.116 Cellulitis of left lower  limb Modifier: sed Quantity: 1 Electronic Signature(s) Signed: 06/21/2019 1:09:00 PM By: Worthy Keeler PA-C Entered By: Worthy Keeler on 06/21/2019 13:08:59

## 2019-06-24 LAB — AEROBIC CULTURE W GRAM STAIN (SUPERFICIAL SPECIMEN): Gram Stain: NONE SEEN

## 2019-06-24 NOTE — Progress Notes (Signed)
GLENROY, CROSSEN ([id number]) Visit Report for 06/21/2019 Arrival Information Details Patient Name: [name], [name] Date of Service: 06/21/2019 8:15 AM Medical Record Number: [id number] Patient Account Number: [id number] Date of Birth/Sex: 1980-10-11 (39 y.o. M) Treating RN: [name] Primary Care Judithe Keetch: [name] Other Clinician: Referring Arlett Goold: [name] Treating Shayne Diguglielmo/Extender: Melburn Hake, [name]: 59 Visit Information History Since Last Visit Added or deleted any medications: No Patient Arrived: Ambulatory Any new allergies or adverse reactions: No Arrival Time: 08:14 Had a fall or experienced change in No Accompanied By: self activities of daily living that may affect Transfer Assistance: None risk of falls: Patient Identification Verified: Yes Signs or symptoms of abuse/neglect since last visito No Patient Requires Transmission-Based No Hospitalized since last visit: No Precautions: Has Dressing in Place as Prescribed: Yes Patient Has Alerts: No Pain Present Now: No Electronic Signature(s) Signed: 06/24/2019 5:22:08 PM By: [name], [name], RN, [name], [name], [name] Entered By: [name], [name], RN, [name], [name] on 06/21/2019 08:14:29 [name] ([id number]) -------------------------------------------------------------------------------- Clinic Level of Care Assessment Details Patient Name: [name] Date of Service: 06/21/2019 8:15 AM Medical Record Number: [id number] Patient Account Number: [id number] Date of Birth/Sex: 1980/07/20 (39 y.o. M) Treating RN: [name] Primary Care Conita Amenta: [name] Other Clinician: Referring Tip Atienza: [name] Treating Nyajah Hyson/Extender: Melburn Hake, [name]: 132 Clinic Level of Care Assessment Items TOOL 4 Quantity Score []  - Use when only an EandM is performed on FOLLOW-UP visit 0 ASSESSMENTS - Nursing Assessment / Reassessment X - Reassessment of Co-morbidities (includes updates in patient  status) 1 10 X- 1 5 Reassessment of Adherence to Treatment Plan ASSESSMENTS - Wound and Skin Assessment / Reassessment X - Simple Wound Assessment / Reassessment - one wound 1 5 []  - 0 Complex Wound Assessment / Reassessment - multiple wounds []  - 0 Dermatologic / Skin Assessment (not related to wound area) ASSESSMENTS - Focused Assessment []  - Circumferential Edema Measurements - multi extremities 0 []  - 0 Nutritional Assessment / Counseling / Intervention []  - 0 Lower Extremity Assessment (monofilament, tuning fork, pulses) []  - 0 Peripheral Arterial Disease Assessment (using hand held doppler) ASSESSMENTS - Ostomy and/or Continence Assessment and Care []  - Incontinence Assessment and Management 0 []  - 0 Ostomy Care Assessment and Management (repouching, etc.) PROCESS - Coordination of Care X - Simple Patient / Family Education for ongoing care 1 15 []  - 0 Complex (extensive) Patient / Family Education for ongoing care X- 1 10 Staff obtains Programmer, systems, Records, Test Results / Process Orders []  - 0 Staff telephones HHA, Nursing Homes / Clarify orders / etc []  - 0 Routine Transfer to another Facility (non-emergent condition) []  - 0 Routine Hospital Admission (non-emergent condition) []  - 0 New Admissions / Biomedical engineer / Ordering NPWT, Apligraf, etc. []  - 0 Emergency Hospital Admission (emergent condition) X- 1 10 Simple Discharge Coordination Miner, Bertha ([id number]) []  - 0 Complex (extensive) Discharge Coordination PROCESS - Special Needs []  - Pediatric / Minor Patient Management 0 []  - 0 Isolation Patient Management []  - 0 Hearing / Language / Visual special needs []  - 0 Assessment of Community assistance (transportation, D/C planning, etc.) []  - 0 Additional assistance / Altered mentation []  - 0 Support Surface(s) Assessment (bed, cushion, seat, etc.) INTERVENTIONS - Wound Cleansing / Measurement X - Simple Wound Cleansing - one wound 1 5 []  -  0 Complex Wound Cleansing - multiple wounds X- 1 5 Wound Imaging (photographs - any number of wounds) []  - 0 Wound Tracing (instead  of photographs) X- 1 5 Simple Wound Measurement - one wound []  - 0 Complex Wound Measurement - multiple wounds INTERVENTIONS - Wound Dressings []  - Small Wound Dressing one or multiple wounds 0 X- 1 15 Medium Wound Dressing one or multiple wounds []  - 0 Large Wound Dressing one or multiple wounds []  - 0 Application of Medications - topical []  - 0 Application of Medications - injection INTERVENTIONS - Miscellaneous []  - External ear exam 0 X- 1 5 Specimen Collection (cultures, biopsies, blood, body fluids, etc.) X- 1 5 Specimen(s) / Culture(s) sent or taken to Lab for analysis []  - 0 Patient Transfer (multiple staff / Civil Service fast streamer / Similar devices) []  - 0 Simple Staple / Suture removal (25 or less) []  - 0 Complex Staple / Suture removal (26 or more) []  - 0 Hypo / Hyperglycemic Management (close monitor of Blood Glucose) []  - 0 Ankle / Brachial Index (ABI) - do not check if billed separately X- 1 5 Vital Signs Dilday, Isabella ([id number]) Has the patient been seen at the hospital within the last three years: Yes Total Score: 100 Level Of Care: New/Established - Level 3 Electronic Signature(s) Signed: 06/24/2019 5:22:08 PM By: [name], [name], RN, [name], [name], [name] Entered By: [name], [name], RN, [name], [name] on 06/21/2019 09:09:27 [name] ([id number]) -------------------------------------------------------------------------------- Encounter Discharge Information Details Patient Name: [name] Date of Service: 06/21/2019 8:15 AM Medical Record Number: [id number] Patient Account Number: [id number] Date of Birth/Sex: 05/22/1980 (38 y.o. M) Treating RN: [name] Primary Care Eily Louvier: [name] Other Clinician: Referring Bridgitt Raggio: [name] Treating Genell Thede/Extender: Melburn Hake, [name]: 132 Encounter Discharge  Information Items Discharge Condition: Stable Ambulatory Status: Ambulatory Discharge Destination: Home Transportation: Private Auto Accompanied By: self Schedule Follow-up Appointment: Yes Clinical Summary of Care: Electronic Signature(s) Signed: 06/24/2019 5:22:08 PM By: [name], [name], RN, [name], [name], [name] Entered By: [name], [name], RN, [name], [name] on 06/21/2019 09:11:26 [name] ([id number]) -------------------------------------------------------------------------------- Lower Extremity Assessment Details Patient Name: [name] Date of Service: 06/21/2019 8:15 AM Medical Record Number: [id number] Patient Account Number: [id number] Date of Birth/Sex: 1981/04/03 (39 y.o. M) Treating RN: [name] Primary Care Deundra Bard: [name] Other Clinician: Referring Justino Boze: [name] Treating Stratton Villwock/Extender: Melburn Hake, [name]: 132 Edema Assessment Assessed: [Left: No] [Right: No] Edema: [Left: N] [Right: o] Vascular Assessment Pulses: Dorsalis Pedis Palpable: [Left:Yes] Electronic Signature(s) Signed: 06/24/2019 5:22:08 PM By: [name], [name], RN, [name], [name], [name] Entered By: [name], [name], RN, [name], [name] on 06/21/2019 08:20:09 [name] ([id number]) -------------------------------------------------------------------------------- Multi Wound Chart Details Patient Name: [name] Date of Service: 06/21/2019 8:15 AM Medical Record Number: [id number] Patient Account Number: [id number] Date of Birth/Sex: [birth year] (39 y.o. M) Treating RN: [name] Primary Care Thandiwe Siragusa: [name] Other Clinician: Referring Malley Hauter: [name] Treating Keanan Melander/Extender: Melburn Hake, [name]: 132 Vital Signs Height(in): 71 Pulse(bpm): 84 Weight(lbs): 338 Blood Pressure(mmHg): 137/80 Body Mass Index(BMI): 47 Temperature(F): 98.8 Respiratory Rate 16 (breaths/min): Photos: [N/A:N/A] Wound Location: Left Lower Leg - Lateral N/A N/A Wounding  Event: Gradually Appeared N/A N/A Primary Etiology: Pyoderma N/A N/A Comorbid History: Sleep Apnea, Hypertension, N/A N/A Colitis Date Acquired: 11/18/2015 N/A N/A Weeks of Treatment: 132 N/A N/A Wound Status: Open N/A N/A Measurements L x W x D 5.6x5.5x1 N/A N/A (cm) Area (cm) : 24.19 N/A N/A Volume (cm) : 24.19 N/A N/A % Reduction in Area: -392.80% N/A N/A % Reduction in Volume: -516.00% N/A N/A Classification: Full Thickness With Exposed N/A N/A Support Structures  Exudate Amount: Medium N/A N/A Exudate Type: Serous N/A N/A Exudate Color: amber N/A N/A Wound Margin: Distinct, outline attached N/A N/A Granulation Amount: Medium (34-66%) N/A N/A Granulation Quality: Pink N/A N/A Necrotic Amount: Medium (34-66%) N/A N/A Exposed Structures: Fat Layer (Subcutaneous N/A N/A Tissue) Exposed: Yes Muscle: Yes Fascia: No Tendon: No TRESTON, COKER ([id number]) Joint: No Bone: No Epithelialization: None N/A N/A Treatment Notes Electronic Signature(s) Signed: 06/24/2019 5:22:08 PM By: [name], [name], RN, [name], [name], [name] Entered By: [name], [name], RN, [name], [name] on 06/21/2019 09:05:23 [name] ([id number]) -------------------------------------------------------------------------------- Multi-Disciplinary Care Plan Details Patient Name: [name] Date of Service: 06/21/2019 8:15 AM Medical Record Number: [id number] Patient Account Number: [id number] Date of Birth/Sex: [birth year] (39 y.o. M) Treating RN: [name] Primary Care Dailon Sheeran: [name] Other Clinician: Referring Magdalynn Davilla: [name] Treating Mehlani Blankenburg/Extender: Melburn Hake, [name]: 132 Active Inactive Orientation to the Wound Care Program Nursing Diagnoses: Knowledge deficit related to the wound healing center program Goals: Patient/caregiver will verbalize understanding of the [facility] Date Initiated: 12/11/2016 Target Resolution Date: 03/13/2017 Goal Status:  Active Interventions: Provide education on orientation to the wound center Notes: Venous Leg Ulcer Nursing Diagnoses: Knowledge deficit related to disease process and management Potential for venous Insuffiency (use before diagnosis confirmed) Goals: Non-invasive venous studies are completed as ordered Date Initiated: 12/11/2016 Target Resolution Date: 03/13/2017 Goal Status: Active Patient will maintain optimal edema control Date Initiated: 12/11/2016 Target Resolution Date: 03/13/2017 Goal Status: Active Patient/caregiver will verbalize understanding of disease process and disease management Date Initiated: 12/11/2016 Target Resolution Date: 03/13/2017 Goal Status: Active Verify adequate tissue perfusion prior to therapeutic compression application Date Initiated: 12/11/2016 Target Resolution Date: 03/13/2017 Goal Status: Active Interventions: Assess peripheral edema status every visit. Compression as ordered Provide education on venous insufficiency Treatment Activities: IWAO, SHAMBLIN ([id number]) Therapeutic compression applied : 12/11/2016 Notes: Wound/Skin Impairment Nursing Diagnoses: Impaired tissue integrity Knowledge deficit related to ulceration/compromised skin integrity Goals: Patient/caregiver will verbalize understanding of skin care regimen Date Initiated: 12/11/2016 Target Resolution Date: 03/13/2017 Goal Status: Active Ulcer/skin breakdown will have a volume reduction of 30% by week 4 Date Initiated: 12/11/2016 Target Resolution Date: 03/13/2017 Goal Status: Active Ulcer/skin breakdown will have a volume reduction of 50% by week 8 Date Initiated: 12/11/2016 Target Resolution Date: 03/13/2017 Goal Status: Active Ulcer/skin breakdown will have a volume reduction of 80% by week 12 Date Initiated: 12/11/2016 Target Resolution Date: 03/13/2017 Goal Status: Active Ulcer/skin breakdown will heal within 14 weeks Date Initiated: 12/11/2016 Target Resolution Date:  03/13/2017 Goal Status: Active Interventions: Assess patient/caregiver ability to obtain necessary supplies Assess patient/caregiver ability to perform ulcer/skin care regimen upon admission and as needed Assess ulceration(s) every visit Provide education on ulcer and skin care Treatment Activities: Skin care regimen initiated : 12/11/2016 Topical wound management initiated : 12/11/2016 Notes: Electronic Signature(s) Signed: 06/24/2019 5:22:08 PM By: [name], [name], RN, [name], [name], [name] Entered By: [name], [name], RN, [name], [name] on 06/21/2019 09:05:13 [name] ([id number]) -------------------------------------------------------------------------------- Pain Assessment Details Patient Name: [name] Date of Service: 06/21/2019 8:15 AM Medical Record Number: [id number] Patient Account Number: [id number] Date of Birth/Sex: 1980/08/30 (39 y.o. M) Treating RN: [name] Primary Care Ashaunti Treptow: [name] Other Clinician: Referring Reigan Tolliver: [name] Treating Dewey Viens/Extender: Melburn Hake, [name]: 132 Active Problems Location of Pain Severity and Description of Pain Patient Has Paino No Site Locations Pain Management and Medication Current Pain Management: Electronic Signature(s) Signed: 06/24/2019 5:22:08 PM By: [name], [name], RN, [name], [name],  [name] Entered By: [name], [name], RN, [name], [name] on 06/21/2019 08:14:34 ANTHONYMICHAEL, MUNDAY ([id number]) -------------------------------------------------------------------------------- Patient/Caregiver Education Details Patient Name: [name] Date of Service: 06/21/2019 8:15 AM Medical Record Number: [id number] Patient Account Number: [id number] Date of Birth/Gender: [birth year] (39 y.o. M) Treating RN: [name] Primary Care Physician: [name] Other Clinician: Referring Physician: [name] Treating Physician/Extender: Sharalyn Ink in Treatment: 132 Education Assessment Education Provided  To: Patient Education Topics Provided Wound/Skin Impairment: Handouts: Caring for Your Ulcer Methods: Demonstration, Explain/Verbal Responses: State content correctly Electronic Signature(s) Signed: 06/24/2019 5:22:08 PM By: [name], [name], RN, [name], [name], [name] Entered By: [name], [name], RN, [name], [name] on 06/21/2019 09:11:54 [name] ([id number]) -------------------------------------------------------------------------------- Wound Assessment Details Patient Name: [name] Date of Service: 06/21/2019 8:15 AM Medical Record Number: [id number] Patient Account Number: [id number] Date of Birth/Sex: [birth year] (39 y.o. M) Treating RN: [name] Primary Care Narya Beavin: [name] Other Clinician: Referring Rhylee Nunn: [name] Treating Kazmir Oki/Extender: Melburn Hake, [name]: 132 Wound Status Wound Number: 1 Primary Etiology: Pyoderma Wound Location: Left Lower Leg - Lateral Wound Status: Open Wounding Event: Gradually Appeared Comorbid History: Sleep Apnea, Hypertension, Colitis Date Acquired: 11/18/2015 Weeks Of Treatment: 132 Clustered Wound: No Photos Wound Measurements Length: (cm) 5.6 Width: (cm) 5.5 Depth: (cm) 1 Area: (cm) 24.19 Volume: (cm) 24.19 % Reduction in Area: -392.8% % Reduction in Volume: -516% Epithelialization: None Wound Description Full Thickness With Exposed Support Foul Odor A Classification: Structures Slough/Fibr Wound Margin: Distinct, outline attached Exudate Medium Amount: Exudate Type: Serous Exudate Color: amber fter Cleansing: No ino Yes Wound Bed Granulation Amount: Medium (34-66%) Exposed Structure Granulation Quality: Pink Fascia Exposed: No Necrotic Amount: Medium (34-66%) Fat Layer (Subcutaneous Tissue) Exposed: Yes Necrotic Quality: Adherent Slough Tendon Exposed: No Muscle Exposed: Yes Necrosis of Muscle: No Joint Exposed: No Bone Exposed: No Silberstein, Novah ([id number]) Treatment Notes Wound #1  (Left, Lateral Lower Leg) Notes TCA, dry gauze, BFD Electronic Signature(s) Signed: 06/21/2019 11:26:09 AM By: Army Melia Signed: 06/24/2019 5:22:08 PM By: [name], [name], RN, [name], [name], [name] Entered By: Army Melia on 06/21/2019 08:22:43 GRACESON, NICHELSON ([id number]) -------------------------------------------------------------------------------- Vitals Details Patient Name: [name] Date of Service: 06/21/2019 8:15 AM Medical Record Number: [id number] Patient Account Number: [id number] Date of Birth/Sex: 08-30-1980 (39 y.o. M) Treating RN: [name] Primary Care Ashlan Dignan: [name] Other Clinician: Referring Darril Patriarca: [name] Treating Matison Nuccio/Extender: Melburn Hake, [name]: 132 Vital Signs Time Taken: 08:15 Temperature (F): 98.8 Height (in): 71 Pulse (bpm): 84 Weight (lbs): 338 Respiratory Rate (breaths/min): 16 Body Mass Index (BMI): 47.1 Blood Pressure (mmHg): 137/80 Reference Range: 80 - 120 mg / dl Electronic Signature(s) Signed: 06/24/2019 5:22:08 PM By: [name], [name], RN, [name], [name], [name] Entered By: [name], [name], RN, [name], [name] on 06/21/2019 08:19:47

## 2019-06-28 ENCOUNTER — Other Ambulatory Visit: Payer: Self-pay

## 2019-06-28 ENCOUNTER — Encounter: Payer: 59 | Admitting: Physician Assistant

## 2019-06-28 DIAGNOSIS — L97222 Non-pressure chronic ulcer of left calf with fat layer exposed: Secondary | ICD-10-CM | POA: Diagnosis not present

## 2019-06-28 NOTE — Progress Notes (Addendum)
[name], [name] ([id number]) Visit Report for 06/28/2019 Chief Complaint Document Details Patient Name: [name], [name] Date of Service: 06/28/2019 8:15 AM Medical Record Number: [id number] Patient Account Number: [id number] Date of Birth/Sex: Sep 03, 1980 (39 y.o. 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 M) Treating RN: Primary Care Provider: [name] Other Clinician: Referring Provider: [name] Treating Provider/Extender: Melburn Hake, Joyce Leckey Weeks in Treatment: 133 History of Present Illness HPI Description: 12/04/16; 39 year old man who comes into the clinic today for review of a wound on the posterior left calf. He tells me that is been there for about a year. He is not a diabetic he does smoke half a pack per day. He was seen in the ER on 11/20/16 felt to have cellulitis around the wound and was given clindamycin. An x-ray did not show osteomyelitis. The patient initially tells me that he has a milk allergy that sets off a pruritic itching rash on his lower legs which she scratches incessantly and he thinks that's what may have set up the wound. He has been using various topical antibiotics and ointments without any effect. He works in a [employer] and is on his feet all day. He does not have a prior history of wounds however  he does have the rash on both lower legs the right arm and the ventral aspect of his left arm. These are excoriations and clearly have had scratching however there are of macular looking areas on both legs including a substantial larger area on the right leg. This does not have an underlying open area. There is no blistering. The patient tells me that 2 years ago in [place] in response to the rash on his legs he saw a dermatologist who told him he had a condition which may be pyoderma gangrenosum although I may be putting words into his mouth. He seemed to recognize this. 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 He is not in any treatment for this. He's had no recent travel 12/11/16; the patient arrives today with his wound and roughly the same condition we've been using silver alginate this is a deep punched out wound with some surrounding erythema but no tenderness. Biopsy I did did not show confirmed pyoderma gangrenosum suggested nonspecific inflammation and vasculitis but does not provide an actual description of what was seen by the pathologist. I'm really not able to understand this We have also received information from the patient's dermatologist in [place] notes from April 2016. This was a doctor Agarwal- antal. 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 He was prescribed topical steroid high potency under occlusion which helped but at this point the patient did not have a deep punched out wound. 12/18/16; the patient's wound is larger in terms of surface area however this surface looks better and there is less depth. The surrounding erythema also is better. The patient states that the wrap we put on came off 2 days ago when he has been using his compression stockings. He  we are in the process of getting a dermatology consult. 12/26/16 on evaluation today patient's left lower extremity wound shows evidence of infection with surrounding  erythema noted. He has been tolerating the dressing changes but states that he has noted more discomfort. There is a larger area of erythema surrounding the wound. No fevers, chills, nausea, or vomiting noted at this time. With that being said the wound still does have slough covering the surface. He is not allergic to any medication that he is aware of at this point. In regard to his right lower extremity he had several regions that are erythematous and pruritic he wonders if there's anything we can do to help that. 01/02/17 I reviewed patient's wound culture which was obtained his visit last week. He was placed on doxycycline at that point. Unfortunately that does not appear to be an antibiotic that would likely help with the situation however the pseudomonas noted on culture is sensitive to Cipro. Also unfortunately patient's wound seems to have a large compared to last week's evaluation. Not severely so but there are definitely increased measurements in general. He is continuing to have discomfort as well he writes this to be a seven out of 10. In fact he would prefer me not to perform any debridement today due to the fact that he is having discomfort and considering he has an active infection on the little reluctant to do so anyway. No fevers, chills, nausea, or vomiting noted at this time. 01/08/17; patient seems dermatology on September 5. I suspect dermatology will want the slides from the biopsy I did sent to their pathologist. I'm not sure if there is a way we can expedite that. In any case the culture I did before I left on vacation 3 weeks ago showed Pseudomonas he was given 10 days of Cipro and per her description of her intake nurses is actually somewhat better this week although the wound is quite a bit bigger than I remember the last time I saw this. He still has 3 more days of Cipro [name], [name] ([id number]) 01/21/17; dermatology appointment tomorrow. He has completed the  ciprofloxacin for Pseudomonas. Surface of the wound looks better however he is had some deterioration in the lesions on his right leg. Meantime the left lateral leg wound we will continue with sample 01/29/17; patient had his dermatology appointment but I can't yet see that note. He is completed his antibiotics. The wound is more superficial but considerably larger in circumferential area than when he came in. This is in his left lateral calf. He also has swollen erythematous areas with superficial wounds on the right leg and small papular areas on both arms. There apparently areas in her his upper thighs and buttocks I did not look at those. Dermatology biopsied the right leg. Hopefully will have their input next week. 02/05/17; patient went back to see his dermatologist who told him that he had a "scratching problem" as well as staph. He is now on a 30 day course of doxycycline and I believe she gave him triamcinolone cream to the right leg areas to help with the itching [not exactly sure but probably triamcinolone]. She apparently looked at the left lateral leg wound although this was not rebiopsied and I think felt to be ultimately part of the same pathogenesis. He is using sample border foam and changing nevus himself. He now has a new open area on the right posterior leg which was his biopsy site I don't have any of the  dermatology notes 02/12/17; we put the patient in compression last week with SANTYL to the wound on the left leg and the biopsy. Edema is much better and the depth of the wound is now at level of skin. Area is still the same oBiopsy site on the right lateral leg we've also been using santyl with a border foam dressing and he is changing this himself. 02/19/17; Using silver alginate started last week to both the substantial left leg wound and the biopsy site on the right wound. He is tolerating compression well. Has a an appointment with his primary M.D. tomorrow wondering about  diuretics although I'm wondering if the edema problem is actually lymphedema 02/26/17; the patient has been to see his primary doctor Dr. Jerrel Ivory at [facility] our primary care. She started him on Lasix 20 mg and this seems to have helped with the edema. However we are not making substantial change with the left lateral calf wound and inflammation. The biopsy site on the right leg also looks stable but not really all that different. 03/12/17; the patient has been to see vein and vascular Dr. Lucky Cowboy. He has had venous reflux studies I have not reviewed these. I did get a call from his dermatology office. 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 They therefore recommended clobetasol and tetracycline i.e. 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 03/26/17; apparently the patient just had reflux studies not an appointment with Dr. dew. She arrives in clinic today having applied clobetasol for 2-3 weeks. He notes over the last 2-3 days excessive drainage having to change the dressing 3-4 times a day and also expanding erythema. 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 Lillia Carmel at [facility] dermatology locally who kindly saw him at my request. 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 My concern was the degree of erythema medially extending from his left leg wound which was either inflammation from pyoderma  or cellulitis. I put him on Augmentin however culture of the wound showed Pseudomonas which is quinolone sensitive. I really don't believe he has cellulitis however in view of everything I will continue and give him a course of Cipro. He is also on doxycycline as an immune modulator for the pyoderma. In addition to his original wound on the left lateral leg with surrounding erythema he has a wound on the right posterior calf which was an original biopsy site done by dermatology. 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 Saw Dr. Lillia Carmel yesterday. He has been using topical antibiotics to both wound areas his original wound on the left and the biopsies/pathergy area on the right. There is definitely some improvement in the inflammation around the wound on the right although the patient states he has increasing sensitivity of the wounds. He is on prednisone 60 and doxycycline 1 as prescribed by Dr. Lillia Carmel. He is covering the topical antibiotic with gauze and  putting this in his own compression stocks and changing this daily. He states that Dr. Lottie Rater did a culture of the left leg wound yesterday 05/07/17; pyoderma gangrenosum. The patient saw Dr. Lillia Carmel yesterday and has a follow-up with her in one month. He is still using topical antibiotics to both wounds although he can't recall exactly what type. He is still on prednisone 60 mg. Dr. Lillia Carmel stated that the doxycycline could stop if we were in agreement. 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 The right medial leg was induced by biopsy/pathergy. The area on the right is essentially healed. 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 The right medial leg has closed and remains closed.  He is still on prednisone 60. oHe tells me he missed his last dermatology appointment with Dr. Lillia Carmel but will make another appointment. He reports that her blood sugar at a recent screen in [place] was high 200's. He was 180 today. He is more cushingoid blood pressure is [name], [name] ([id number]) up a bit. I think he is going to require still much longer prednisone perhaps another 3 months before attempting to taper. In the meantime his wound is a lot better. Smaller. He is cleaning this off daily and applying topical antibiotics. When he was last in the clinic I thought about changing to [facility] and actually put in a couple of calls to dermatology although probably not during their business hours. 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 He continues on oral prednisone. He has been using triple antibiotic ointment. There is surface debris and we will transition to [facility] and have him return in 2 weeks. He has lost 30 pounds since his last appointment with lifestyle modification. He may benefit from topical steroid cream for treatment this can be considered at a later date. 08/22/17 on evaluation today patient appears to actually be doing rather well in regard to his left lateral lower extremity ulcer. He has actually been managed by Dr. Dellia Nims most recently. Patient is currently on oral steroids at this time. This seems to have been of benefit for him. Nonetheless his last visit was actually with Leah on 08/06/17. Currently he is not utilizing any topical steroid creams although this could be of benefit as well. No fevers, chills, nausea, or vomiting noted at this time. 09/05/17 on evaluation today patient appears to be doing better in regard to his left lateral lower extremity ulcer. He has been tolerating the dressing changes without complication. He is using  Santyl with good effect. Overall I'm very pleased with how things are standing at this point. Patient likewise is happy that this is doing better. 09/19/17 on evaluation today patient actually appears to be doing rather well in regard to his left lateral lower extremity ulcer. 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 He's not having any significant pain. 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 He has been tolerating the Santyl without complication and in general I feel like he's making good progress. 10/17/17 on evaluation  today patient appears to be doing very well in regard to his left lateral lower surety ulcer. He has been tolerating the dressing changes without complication. There does not appear to be any evidence of infection he's alternating the Santyl and the triple antibiotic ointment every other day this seems to be doing well for him. 11/03/17 on evaluation today patient appears to be doing very well in regard to his left lateral lower extremity ulcer. He is been tolerating the dressing changes without complication which is good news. Fortunately there does not appear to be any evidence of infection which is also great news. Overall is doing excellent they are starting to taper down on the prednisone is down to 40 mg at this point it also started topical clobetasol for him. 11/17/17 on evaluation today patient appears to be doing well in regard to his left lateral lower surety ulcer. He's been tolerating the dressing changes without complication. He does note that he is having no pain, no excessive drainage or discharge, and overall he feels like things are going about how he would expect and hope they would. Overall he seems to have no evidence of infection at this time in my opinion which is good  news. 12/04/17-He is seen in follow-up evaluation for right lateral lower extremity ulcer. He has been applying topical steroid cream. Today's measurement show slight increase in size. Over the next 2 weeks we will transition to every other day Santyl and steroid cream. He has been encouraged to monitor for changes and notify clinic with any concerns 12/15/17 on evaluation today patient's left lateral motion the ulcer and fortunately is doing worse again at this point. This just since last week to this week has close to doubled in size according to the patient. I did not seeing last week's I do not have a visual to compare this to in our system was also down so we do not have all the charts and at this point. Nonetheless it does have me somewhat concerned in regard to the fact that again he was worried enough about it he has contact the dermatology that placed them back on the full strength, 50 mg a day of the prednisone that he was taken previous. He continues to alternate using clobetasol along with Santyl at this point. He is obviously somewhat frustrated. 12/22/17 on evaluation today patient appears to be doing a little worse compared to last evaluation. Unfortunately the wound is a little deeper and slightly larger than the last week's evaluation. With that being said he has made some progress in regard to the irritation surrounding at this time unfortunately despite that progress that's been made he still has a significant issue going on here. 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 For that reason I discussed with him today the possibility of perform a wound culture to ensure there's nothing overtly infected. 01/06/18 on evaluation today patient's wound is larger and deeper than previously evaluated. With that being said it did appear that his  wound was infected after my last evaluation with him. Subsequently I did end up prescribing a prescription for Bactrim DS which she has been taking and having no complication with. Fortunately there does not appear to be any evidence of Nordell, [name] ([id number]) infection at this point in time as far as anything spreading, no want to touch, and overall I  feel like things are showing signs of improvement. 01/13/18 on evaluation today patient appears to be even a little larger and deeper than last time. There still muscle exposed in the base of the wound. Nonetheless he does appear to be less erythematous I do believe inflammation is calming down also believe the infection looks like it's probably resolved at this time based on what I'm seeing. No fevers, chills, nausea, or vomiting noted at this time. 01/30/18 on evaluation today patient actually appears to visually look better for the most part. Unfortunately those visually this looks better he does seem to potentially have what may be an abscess in the muscle that has been noted in the central portion of the wound. This is the first time that I have noted what appears to be fluctuance in the central portion of the muscle. With that being said I'm somewhat more concerned about the fact that this might indicate an abscess formation at this location. I do believe that an ultrasound would be appropriate. This is likely something we need to try to do as soon as possible. He has been switch to mupirocin ointment and he is no longer using the steroid ointment as prescribed by dermatology he sees them again next week he's been decreased from 60 to 40 mg of prednisone. 03/09/18 on evaluation today patient actually appears to be doing a little better compared to last time I saw him. There's not as much erythema surrounding the wound itself. He I did review his most recent infectious disease note which was dated 02/24/18. He saw Dr. Michel Bickers in [place].  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 Patient is now off of antibiotics and they are continually observing this. There seems to be no change in the past two weeks in my pinion based on what the patient says and what I see today compared to what Dr. Megan Salon likely saw two weeks ago. No fevers, chills, nausea, or vomiting noted at this time. 03/23/18 on evaluation today patient's wound actually appears to be showing signs of improvement which is good news. He is currently still on the Dapsone. He is also working on tapering the prednisone to get off of this and Dr. Lottie Rater is working with him in this regard. Nonetheless overall I feel like the wound is doing well it does appear based on the infectious disease note that I reviewed from Dr. 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 I did also review the results of his ultrasound of left lower extremity which revealed there was a dentist tissue in the base of the wound without an abscess noted. 04/06/18 on evaluation today the patient's left lateral lower extremity ulcer actually appears to be doing fairly well which is excellent news. There does not appear to be any evidence of infection at this time which is also great news. Overall he still does have a significantly large ulceration although little by little he seems to be making progress. He is down to 10 mg a day of the prednisone. 04/20/18 on evaluation today patient actually appears to be doing excellent at this time in regard to his left lower extremity ulcer. He's making signs of good progress unfortunately this is taking much longer than we would really like to see but nonetheless  he is making progress. Fortunately there does not appear to be any evidence of infection at this time. No  fevers, chills, nausea, or vomiting noted at this time. The patient has not been using the Santyl due to the cost he hadn't got in this field yet. He's mainly been using the antibiotic ointment topically. Subsequently he also tells me that he really has not been scrubbing in the shower I think this would be helpful again as I told him it doesn't have to be anything too aggressive to even make it believe just enough to keep it free of some of the loose slough and biofilm on the wound surface. 05/11/18 on evaluation today patient's wound appears to be making slow but sure progress in regard to the left lateral lower extremity ulcer. He is been tolerating the dressing changes without complication. Fortunately there does not appear to be any evidence of infection at this time. He is still just using triple antibiotic ointment along with clobetasol occasionally over the area. He never got the Santyl and really does not seem to intend to in my pinion. 06/01/18 on evaluation today patient appears to be doing a little better in regard to his left lateral lower extremity ulcer. He states that overall he does not feel like he is doing as well with the Dapsone as he did with the prednisone. Nonetheless he sees his dermatologist later today and is gonna talk to them about the possibility of going back on the prednisone. Overall again I believe that the wound would be better if you would utilize Santyl but he really does not seem to be interested in going back to the [place] at this point. He has been using triple antibiotic ointment. 06/15/18 on evaluation today patient's wound actually appears to be doing about the same at this point. Fortunately there is no signs of infection at this time. He has made slight improvements although he continues to not really want to clean the wound bed at this point. He states that he just doesn't mess with it he doesn't want to cause any problems with everything else he has going  on. He has been on medication, antibiotics as prescribed by his dermatologist, for a staff infection of his lower extremities which is really drying out now and looking much better he tells me. Fortunately there is no sign of overall infection. [name], [name] ([id number]) 06/29/18 on evaluation today patient appears to be doing well in regard to his left lateral lower surety ulcer all things considering. Fortunately his staff infection seems to be greatly improved compared to previous. He has no signs of infection and this is drying up quite nicely. He is still the doxycycline for this is no longer on cental, Dapsone, or any of the other medications. His dermatologist has recommended possibility of an infusion but right now he does not want to proceed with that. 07/13/18 on evaluation today patient appears to be doing about the same in regard to his left lateral lower surety ulcer. Fortunately there's no signs of infection at this time which is great news. Unfortunately he still builds up a significant amount of Slough/biofilm of the surface of the wound he still is not really cleaning this as he should be appropriately. Again I'm able to easily with saline and gauze remove the majority of this on the surface which if you would do this at home would likely be a dramatic improvement for him as far as getting the area to improve. Nonetheless overall  I still feel like he is making progress is just very slow. I think Santyl will be of benefit for him as well. Still he has not gotten this as of this point. 07/27/18 on evaluation today patient actually appears to be doing little worse in regards of the erythema around the periwound region of the wound he also tells me that he's been having more drainage currently compared to what he was experiencing last time I saw him. He states not quite as bad as what he had because this was infected previously but nonetheless is still appears to be doing poorly. Fortunately  there is no evidence of systemic infection at this point. The patient tells me that he is not going to be able to afford the Santyl. He is still waiting to hear about the infusion therapy with his dermatologist. Apparently she wants an updated colonoscopy first. 08/10/18 on evaluation today patient appears to be doing better in regard to his left lateral lower extremity ulcer. Fortunately he is showing signs of improvement in this regard he's actually been approved for Remicade infusion's as well although this has not been scheduled as of yet. Fortunately there's no signs of active infection at this time in regard to the wound although he is having some issues with infection of the right lower extremity is been seen as dermatologist for this. Fortunately they are definitely still working with him trying to keep things under control. 09/07/18 on evaluation today patient is actually doing rather well in regard to his left lateral lower extremity ulcer. He notes these actually having some hair grow back on his extremity which is something he has not seen in years. He also tells me that the pain is really not giving them any trouble at this time which is also good news overall she is very pleased with the progress he's using a combination of the mupirocin along with the probate is all mixed. 09/21/18 on evaluation today patient actually appears to be doing fairly well all things considered in regard to his looks from the ulcer. He's been tolerating the dressing changes without complication. Fortunately there's no signs of active infection at this time which is good news he is still on all antibiotics or prevention of the staff infection. He has been on prednisone for time although he states it is gonna contact his dermatologist and see if she put them on a short course due to some irritation that he has going on currently. Fortunately there's no evidence of any overall worsening this is going very slow I  think cental would be something that would be helpful for him although he states that $50 for tube is quite expensive. He therefore is not willing to get that at this point. 10/06/18 on evaluation today patient actually appears to be doing decently well in regard to his left lateral leg ulcer. He's been tolerating the dressing changes without complication. Fortunately there's no signs of active infection at this time. Overall I'm actually rather pleased with the progress he's making although it's slow he doesn't show any signs of infection and he does seem to be making some improvement. I do believe that he may need a switch up and dressings to try to help this to heal more appropriately and quickly. 10/19/18 on evaluation today patient actually appears to be doing better in regard to his left lateral lower extremity ulcer. This is shown signs of having much less Slough buildup at this point due to the fact he has  been using the Santyl. Obviously this is very good news. The overall size of the wound is not dramatically smaller but again the appearance is. 11/02/18 on evaluation today patient actually appears to be doing quite well in regard to his lower [place] ulcer. A lot of the skin around the ulcer is actually somewhat irritating at this point this seems to be more due to the dressing causing irritation from the adhesive that anything else. Fortunately there is no signs of active infection at this time. 11/24/18 on evaluation today patient appears to be doing a little worse in regard to his overall appearance of his lower extremity ulcer. There's more erythema and warmth around the wound unfortunately. He is currently on doxycycline which he has been on for some time. With that being said I'm not sure that seems to be helping with what appears to possibly be an acute cellulitis with regard to his left lower extremity ulcer. No fevers, chills, nausea, or vomiting noted at this time. 12/08/18 on  evaluation today patient's wounds actually appears to be doing significantly better compared to his last evaluation. He has been using Santyl along with alternating tripling about appointment as well as the steroid cream seems to be doing [name], [name] ([id number]) quite well and the wound is showing signs of improvement which is excellent news. Fortunately there's no evidence of infection and in fact his culture came back negative with only normal skin flora noted. 12/21/2018 upon evaluation today patient actually appears to be doing excellent with regard to his ulcer. This is actually the best that I have seen it since have been helping to take care of him. It is both smaller as well as less slough noted on the surface of the wound and seems to be showing signs of good improvement with new skin growing from the edges. He has been using just the triamcinolone he does wonder if he can get a refill of that ointment today. 01/04/2019 upon evaluation today patient actually appears to be doing well with regard to his left lateral lower extremity ulcer. With that being said it does not appear to be that he is doing quite as well as last time as far as progression is concerned. There does not appear to be any signs of infection or significant irritation which is good news. With that being said I do believe that he may benefit from switching to a collagen based dressing based on how clean The wound appears. 01/18/2019 on evaluation today patient actually appears to be doing well with regard to his wound on the left lower extremity. He is not made a lot of progress compared to where we were previous but nonetheless does seem to be doing okay at this time which is good news. There is no signs of active infection which is also good news. My only concern currently is I do wish we can get him into utilizing the collagen dressing his insurance would not pay for the supplies that we ordered although it appears that he  may be able to order this through his supply company that he typically utilizes. This is [employer]. Nonetheless he did try to order it during the office visit today and it appears this did go through. We will see if he can get that it is a different brand but nonetheless he has collagen and I do think will be beneficial. 02/01/2019 on evaluation today patient actually appears to be doing a little worse today in regard to the overall size  of his wounds. Fortunately there is no signs of active infection at this time. That is visually. Nonetheless when this is happened before it was due to infection. For that reason were somewhat concerned about that this time as well. 02/08/2019 on evaluation today patient unfortunately appears to be doing slightly worse with regard to his wound upon evaluation today. Is measuring a little deeper and a little larger unfortunately. I am not really sure exactly what is causing this to enlarge he actually did see his dermatologist she is going to see about initiating Humira for him. Subsequently she also did do steroid injections into the wound itself in the periphery. Nonetheless still nonetheless he seems to be getting a little bit larger he is gone back to just using the steroid cream topically which I think is appropriate. I would say hold off on the collagen for the time being is definitely a good thing to do. Based on the culture results which we finally did get the final result back regarding it shows staph as the bacteria noted again that can be a normal skin bacteria based on the fact however he is having increased drainage and worsening of the wound measurement wise I would go ahead and place him on an antibiotic today I do believe for this. 02/15/2019 on evaluation today patient actually appears to be doing somewhat better in regard to his ulcer. There is no signs of worsening at this time I did review his culture results which showed evidence of Staphylococcus  aureus but not MRSA. Again this could just be more related to the normal skin bacteria although he states the drainage has slowed down quite a bit he may have had a mild infection not just colonization. And was much smaller and then since around10/04/2019 on evaluation today patient appears to be doing unfortunately worse as far as the size of the wound. I really feel like that this is steadily getting larger again it had been doing excellent right at the beginning of September we have seen a steady increase in the area of the wound it is almost 2-1/2 times the size it was on September 1. Obviously this is a bad trend this is not wanting to see. For that reason we went back to using just the topical triamcinolone cream which does seem to help with inflammation. I checked him for bacteria by way of culture and nothing showed positive there. I am considering giving him a short course of a tapering steroid Dosepak today to see if that is can be beneficial for him. The patient is in agreement with giving that a try. 03/08/2019 on evaluation today patient appears to be doing very well in comparison to last evaluation with regard to his lower extremity ulcer. This is showing signs of less inflammation and actually measuring slightly smaller compared to last time every other week over the past month and a half he has been measuring larger larger larger. Nonetheless I do believe that the issue has been inflammation the prednisone does seem to have been beneficial for him which is good news. No fevers, chills, nausea, vomiting, or diarrhea. 03/22/2019 on evaluation today patient appears to be doing about the same with regard to his leg ulcer. He has been tolerating the dressing changes without complication. With that being said the wound seems to be mostly arrested at its current size but really is not making any progress except for when we prescribed the prednisone. He did show some signs of dropping as  far as the overall size of the wound during that interval week. Nonetheless this is something he is not on long-term at this point and unfortunately I think he is getting need either this or else the Humira which his dermatologist has discussed try to get approval for. With that being said he will be seeing his dermatologist on the 11th of this month that is November. [name], [name] ([id number]) 04/19/2019 on evaluation today patient appears to be doing really about the same the wound is measuring slightly larger compared to last time I saw him. He has not been into the office since November 2 due to the fact that he unfortunately had Covid as that his entire family. He tells me that it was rough but they did pull-through and he seems to be doing much better. Fortunately there is no signs of active infection at this time. No fevers, chills, nausea, vomiting, or diarrhea. 05/10/2019 on evaluation today patient unfortunately appears to be doing significantly worse as compared to last time I saw him. He does tell me that he has had his first dose of Humira and actually is scheduled to get the next one in the upcoming week. With that being said he tells me also that in the past several days he has been having a lot of issues with green drainage she showed me a picture this is more blue-green in color. He is also been having issues with increased sloughy buildup and the wound does appear to be larger today. Obviously this is not the direction that we want everything to take based on the starting of his Humira. Nonetheless I think this is definitely a result of likely infection and to be honest I think this is probably Pseudomonas causing the infection based on what I am seeing. 05/24/2019 on evaluation today patient unfortunately appears to be doing significantly worse compared to his prior evaluation with me 2 weeks ago. I did review his culture results which showed that he does have Staph aureus as well as  Pseudomonas noted on the culture. Nonetheless the Levaquin that I prescribed for him does not appear to have been appropriate and in fact he tells me he is no longer experiencing the green drainage and discharge that he had at the last visit. Fortunately there is no signs of active infection at this time which is good news although the wound has significantly worsened it in fact is much deeper than it was previous. We have been utilizing up to this point triamcinolone ointment as the prescription topical of choice but at this time I really feel like that the wound is getting need to be packed in order to appropriately manage this due to the deeper nature of the wound. Therefore something along the lines of an alginate dressing may be more appropriate. 05/31/2019 upon inspection today patient's wound actually showed signs of doing poorly at this point. Unfortunately he just does not seem to be making any good progress despite what we have tried. He actually did go ahead and pick up the Cipro and start taking that as he was noticing more green drainage he had previously completed the Levaquin that I prescribed for him as well. Nonetheless he missed his appointment for the seventh last week on Wednesday with the wound care center and [facility] where his dermatologist referred him. Obviously I do think a second opinion would be helpful at this point especially in light of the fact that the patient seems to be doing so  poorly despite the fact that we have tried everything that I really know how at this point. The only thing that ever seems to have helped him in the past is when he was on high doses of continual steroids that did seem to make a difference for him. 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 06/07/2019 upon evaluation today  patient unfortunately appears to be doing worse yet again with regard to his wound. In fact I am starting to question whether or not he may have a fluid pocket in the muscle at this point based on the bulging and the soft appearance to the central portion of the muscle area. There is not anything draining from the muscle itself at this time which is good news but nonetheless the wound is expanding. I am not really seeing any results of the Humira as far as overall wound progression based on what I am seeing at this point. The patient has been referred for second opinion with regard to his wound to the [name] wound care center by his dermatologist which I definitely am not in opposition to. Unfortunately we tried multiple dressings in the past including collagen, alginate, and at one point even Hydrofera Blue. With that being said he is never really used it for any significant amount of time due to the fact that he often complains of pain associated with these dressings and then will go back to either using the Santyl which she has done intermittently or more frequently the triamcinolone. He is also using his own compression stockings. We have wrapped him in the past but again that was something else that he really was not a big fan of. 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 He seemed to do much better when he was on the steroids although honestly I understand that the steroids are not necessarily the best medication to be on long-term obviously 06/14/2019 on evaluation today patient appears to be doing actually a little bit better with regard to the overall appearance with his  leg. Unfortunately he does continue to have issues with what appears to be some fluid underneath the muscle although he did see the wound specialty center at [facility] last week their main goals were to see about infusion therapy in place of the Humira as they feel like that is not quite strong enough. They also recommended that we continue with the treatment otherwise as we are they felt like that was appropriate and they are okay with him continuing to follow-up here with [place] in that regard. With that being said they are also sending him to the vein specialist there to see about vein stripping and if that would be of benefit for him. Subsequently they also did not really address whether or not an ultrasound of the muscle area to see if there is anything that needs to be addressed here would be appropriate or not. For that reason I discussed this with him last  week I think we may proceed down that road at this point. KRISTION, HOLIFIELD ([id number]) 06/21/2019 upon evaluation today patient's wound actually appears to be doing slightly better compared to previous evaluations. I do believe that he has made a difference with regard to the progression here with the use of oral steroids. Again in the past has been the only thing that is really calm things down. He does tell me that from [facility] is gotten a good news from there that there are no further vein stripping that is necessary at this point. I do not have that available for review today although the patient did relay this to me. He also did obtain and have the ultrasound of the wound completed which I did sign off on today. It does appear that there is no fluid collection under the muscle this is likely then just edematous tissue in general. That is also good news. Overall I still believe the inflammation is the main issue here. He did inquire about the possibility of a wound VAC again with the muscle protruding like it is I am not really sure whether the wound VAC  is necessarily ideal or not. That is something we will have to consider although I do believe he may need compression wrapping to try to help with edema control which could potentially be of benefit. 06/28/2019 on evaluation today patient appears to be doing slightly better measurement wise although this is not terribly smaller he least seems to be trending towards that direction. With that being said he still seems to have purulent drainage noted in the wound bed at this time. He has been on Levaquin followed by Cipro over the past month. Unfortunately he still seems to have some issues with active infection at this time. I did perform a culture last week in order to evaluate and see if indeed there was still anything going on. Subsequently the culture did come back showing Pseudomonas which is consistent with the drainage has been having which is blue-green in color. He also has had an odor that again was somewhat consistent with Pseudomonas as well. Long story short it appears that the culture showed an intermediate finding with regard to how well the Cipro will work for the Pseudomonas infection. Subsequently being that he does not seem to be clearing up and at best what we are doing is just keeping this at [place] I think he may need to see infectious disease to discuss IV antibiotic options. Electronic Signature(s) Signed: 06/28/2019 2:15:04 PM By: Worthy Keeler PA-C Entered By: Worthy Keeler on 06/28/2019 14:15:04 KEINAN, BROUILLET ([id number]) -------------------------------------------------------------------------------- Physical Exam Details Patient Name: [name] Date of Service: 06/28/2019 8:15 AM Medical Record Number: [id number] Patient Account Number: [id number] Date of Birth/Sex: May 22, 1980 (39 y.o. M) Treating RN: Primary Care Provider: [name] Other Clinician: Referring Provider: [name] Treating Provider/Extender: Melburn Hake, Cheng Dec Weeks in Treatment:  133 Constitutional Obese and well-hydrated in no acute distress. Respiratory normal breathing without difficulty. Psychiatric this patient is able to make decisions and demonstrates good insight into disease process. Alert and Oriented x 3. pleasant and cooperative. Notes Upon inspection patient's wound bed actually showed signs of still significant purulent drainage noted which is blue-green in color. He also continues to have muscle exposed in the base of the wound. This is still not showing signs of dramatically worsening from the standpoint of size over the past several weeks though prior to that he was continually getting larger and larger. We do  seem to have arrested some of that enlargement with the use of the steroid for short time. Nonetheless I still believe that he seems to have evidence of active infection not just colonization based on the fact that he has purulent drainage from blue-green coloration to it. Again I do think this is consistent with Pseudomonas that is also what the culture showed but the Cipro/Levaquin at this point appears to be intermediate at best as far as treating it and I do not think has been strong enough to really knock this out. Electronic Signature(s) Signed: 06/28/2019 2:16:13 PM By: Worthy Keeler PA-C Entered By: Worthy Keeler on 06/28/2019 14:16:12 [name], [name] ([id number]) -------------------------------------------------------------------------------- Physician Orders Details Patient Name: [name] Date of Service: 06/28/2019 8:15 AM Medical Record Number: [id number] Patient Account Number: [id number] Date of Birth/Sex: 1980-11-13 (39 y.o. 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 Anesthetic (add to Medication List) Wound #1 Left,Lateral Lower Leg o Topical Lidocaine 4% cream applied to wound bed prior to debridement (In Clinic Only). Skin Barriers/Peri-Wound Care Wound #1 Left,Lateral Lower Leg o Triamcinolone Acetonide Ointment (TCA) Primary Wound Dressing o Dry Gauze Secondary Dressing Wound #1 Left,Lateral Lower Leg o Boardered Foam Dressing Dressing Change Frequency Wound #1 Left,Lateral Lower Leg o Change dressing every other day. Follow-up Appointments Wound #1 Left,Lateral Lower Leg o Return Appointment in 1 week. Edema Control Wound #1 Left,Lateral Lower Leg o Elevate legs to the level of the heart and pump ankles as often as possible Consults o Infectious Disease - [place] of ID for IV Abx [name], [name] ([id number]) Patient Medications Allergies: milk, Biaxin, seasonal Notifications Medication Indication Start End Levaquin 06/28/2019 DOSE 1 - oral 750 mg tablet - 1 tablet oral taken daily for 14 days Electronic Signature(s) Signed: 06/28/2019 1:19:41 PM By: Worthy Keeler PA-C Entered By: Worthy Keeler on 06/28/2019 13:19:39 [place], [name] ([id number]) -------------------------------------------------------------------------------- Problem List Details Patient Name: [name] Date of Service: 06/28/2019 8:15 AM Medical Record Number: [id number] Patient Account Number: [id number] Date of Birth/Sex: 1980/08/18 (39 y.o. 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 Yes I87.2 Venous insufficiency (chronic) (peripheral) 12/04/2016 No Yes L03.116 Cellulitis of left lower limb 05/24/2019 No Yes Inactive Problems ICD-10 Code Description Active Date Inactive Date L97.213 Non-pressure chronic ulcer of right calf with necrosis of muscle 04/02/2017 04/02/2017 Resolved Problems Electronic Signature(s) Signed: 06/28/2019 8:54:38 AM By: Worthy Keeler PA-C Entered By: Worthy Keeler on 06/28/2019 08:54:38 Fortin, [name] ([id number]) -------------------------------------------------------------------------------- Progress Note Details Patient Name: [name] Date of Service: 06/28/2019 8:15 AM Medical Record Number: [id number] Patient Account Number: [id number] Date of Birth/Sex: February 13, 1981 (39 y.o. M) Treating RN: Primary Care Provider: [name] Other Clinician: Referring Provider: [name] Treating Provider/Extender: Melburn Hake, Anu Stagner Weeks in Treatment: 133 Subjective Chief Complaint Information obtained from Patient He is here in follow up  evaluation for LLE pyoderma ulcer History of Present Illness (HPI) 12/04/16; 39 year old man who comes into the clinic today for review of a wound on the posterior left calf. He tells me that is been there for about a year. He is not a diabetic he does smoke half a pack per day. He was seen in the ER on 11/20/16 felt to have cellulitis around the wound and was given clindamycin. An x-ray did not show osteomyelitis. The patient initially tells me that he has a milk allergy that sets off a pruritic itching rash on his lower legs which she scratches incessantly and he thinks that's what may have set up the wound. He has been using various topical antibiotics and ointments without any effect. He works in a [employer] and is on his feet all day. He does not have a prior history of wounds however he  does have the rash on both lower legs the right arm and the ventral aspect of his left arm. These are excoriations and clearly have had scratching however there are of macular looking areas on both legs including a substantial larger area on the right leg. This does not have an underlying open area. There is no blistering. The patient tells me that 2 years ago in [place] in response to the rash on his legs he saw a dermatologist who told him he had a condition which may be pyoderma gangrenosum although I may be putting words into his mouth. He seemed to recognize this. 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 He is not in any treatment for this. He's had no recent travel 12/11/16; the patient arrives today with his wound and roughly the same condition we've been using silver alginate this is a deep punched out wound with some surrounding erythema but no tenderness. Biopsy I did did not show confirmed pyoderma gangrenosum suggested nonspecific inflammation and vasculitis but does not provide an actual description of what was seen by the pathologist. I'm really not able to understand this We have also received information from the patient's dermatologist in [place] notes from April 2016. This was a doctor Agarwal- antal. 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 He was prescribed topical steroid high potency under occlusion which helped but at this point the patient did not have a deep punched out wound. 12/18/16; the patient's wound is larger in terms of surface area however this surface looks better and there is less depth. The surrounding erythema also is better. The patient states that the wrap we put on came off 2 days ago when he has been using his compression stockings. He we are in the process of getting a dermatology consult. 12/26/16 on evaluation today patient's left lower extremity wound shows evidence of infection with surrounding  erythema noted. He has been tolerating the dressing changes but states that he has noted more discomfort. There is a larger area of erythema surrounding the wound. No fevers, chills, nausea, or vomiting noted at this time. With that being said the wound still does have slough covering the surface. He is not allergic to any medication that he is aware of at this point. In regard to his right lower extremity he had several regions that are erythematous and pruritic he wonders if there's anything we can do to help that. 01/02/17 I reviewed patient's wound culture which was obtained his visit last week. He was placed on doxycycline at that point. Unfortunately that does not  appear to be an antibiotic that would likely help with the situation however the pseudomonas noted on culture is sensitive to Cipro. Also unfortunately patient's wound seems to have a large compared to last week's evaluation. Not severely so but there are definitely increased measurements in general. He is continuing to have discomfort as well he writes this to be a seven out of 10. In fact he would prefer me not to perform any debridement today due to the fact that he is having discomfort and considering he has an active infection on the little reluctant to do so anyway. No fevers, Craney, Meshulem ([id number]) chills, nausea, or vomiting noted at this time. 01/08/17; patient seems dermatology on September 5. I suspect dermatology will want the slides from the biopsy I did sent to their pathologist. I'm not sure if there is a way we can expedite that. In any case the culture I did before I left on vacation 3 weeks ago showed Pseudomonas he was given 10 days of Cipro and per her description of her intake nurses is actually somewhat better this week although the wound is quite a bit bigger than I remember the last time I saw this. He still has 3 more days of Cipro 01/21/17; dermatology appointment tomorrow. He has completed the  ciprofloxacin for Pseudomonas. Surface of the wound looks better however he is had some deterioration in the lesions on his right leg. Meantime the left lateral leg wound we will continue with sample 01/29/17; patient had his dermatology appointment but I can't yet see that note. He is completed his antibiotics. The wound is more superficial but considerably larger in circumferential area than when he came in. This is in his left lateral calf. He also has swollen erythematous areas with superficial wounds on the right leg and small papular areas on both arms. There apparently areas in her his upper thighs and buttocks I did not look at those. Dermatology biopsied the right leg. Hopefully will have their input next week. 02/05/17; patient went back to see his dermatologist who told him that he had a "scratching problem" as well as staph. He is now on a 30 day course of doxycycline and I believe she gave him triamcinolone cream to the right leg areas to help with the itching [not exactly sure but probably triamcinolone]. She apparently looked at the left lateral leg wound although this was not rebiopsied and I think felt to be ultimately part of the same pathogenesis. He is using sample border foam and changing nevus himself. He now has a new open area on the right posterior leg which was his biopsy site I don't have any of the dermatology notes 02/12/17; we put the patient in compression last week with SANTYL to the wound on the left leg and the biopsy. Edema is much better and the depth of the wound is now at level of skin. Area is still the same Biopsy site on the right lateral leg we've also been using santyl with a border foam dressing and he is changing this himself. 02/19/17; Using silver alginate started last week to both the substantial left leg wound and the biopsy site on the right wound. He is tolerating compression well. Has a an appointment with his primary M.D. tomorrow wondering about  diuretics although I'm wondering if the edema problem is actually lymphedema 02/26/17; the patient has been to see his primary doctor Dr. Jerrel Ivory at [facility] our primary care. She started him on Lasix 20 mg and  this seems to have helped with the edema. However we are not making substantial change with the left lateral calf wound and inflammation. The biopsy site on the right leg also looks stable but not really all that different. 03/12/17; the patient has been to see vein and vascular Dr. Lucky Cowboy. He has had venous reflux studies I have not reviewed these. I did get a call from his dermatology office. 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 They therefore recommended clobetasol and tetracycline i.e. 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 03/26/17; apparently the patient just had reflux studies not an appointment with Dr. dew. She arrives in clinic today having applied clobetasol for 2-3 weeks. He notes over the last 2-3 days excessive drainage having to change the dressing 3-4 times a day and also expanding erythema. 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 Lillia Carmel at [facility] dermatology locally who kindly saw him at my request. 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 My concern was the degree of erythema medially extending from his left leg wound which was either inflammation from pyoderma  or cellulitis. I put him on Augmentin however culture of the wound showed Pseudomonas which is quinolone sensitive. I really don't believe he has cellulitis however in view of everything I will continue and give him a course of Cipro. He is also on doxycycline as an immune modulator for the pyoderma. In addition to his original wound on the left lateral leg with surrounding erythema he has a wound on the right posterior calf which was an original biopsy site done by dermatology. 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 Saw Dr. Lillia Carmel yesterday. He has been using topical antibiotics to both wound areas his original wound on the left and the biopsies/pathergy area on the right. There is definitely some improvement in the inflammation around the wound on the right although the patient states he has increasing sensitivity of the wounds. He is on prednisone 60 and doxycycline 1 as prescribed by Dr. Lillia Carmel. He is covering the topical antibiotic with gauze and putting this in his own compression stocks and changing this daily. He states that Dr. Lottie Rater did a culture of the left leg wound yesterday 05/07/17; pyoderma gangrenosum. The patient saw Dr. Lillia Carmel yesterday and has a follow-up with her in one month. He is still using topical antibiotics to both wounds although he can't recall exactly what type. He is still on prednisone 60 mg. Dr. Lillia Carmel stated that the doxycycline could stop if we were in agreement. 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 The right medial leg was induced by [name] ([id number]) biopsy/pathergy. The area on the right is essentially healed. 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 The  right medial leg has  closed and remains closed. He is still on prednisone 60. He tells me he missed his last dermatology appointment with Dr. Lillia Carmel but will make another appointment. He reports that her blood sugar at a recent screen in [place] was high 200's. He was 180 today. 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 I think he is going to require still much longer prednisone perhaps another 3 months before attempting to taper. In the meantime his wound is a lot better. Smaller. He is cleaning this off daily and applying topical antibiotics. When he was last in the clinic I thought about changing to [facility] and actually put in a couple of calls to dermatology although probably not during their business hours. 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 He continues on oral prednisone. He has been using triple antibiotic ointment. There is surface debris and we will transition to [facility] and have him return in 2 weeks. He has lost 30 pounds since his last appointment with lifestyle modification. He may benefit from topical steroid cream for treatment this can be considered at a later date. 08/22/17 on evaluation today patient appears to actually be doing rather well in regard to his left lateral lower extremity ulcer. He has actually been managed by Dr. Dellia Nims most recently. Patient is currently on oral steroids at this time. This seems to have been of benefit for him. Nonetheless his last visit was actually with Leah on 08/06/17. Currently he is not utilizing any topical steroid creams although this could be of benefit as well. No fevers, chills, nausea, or vomiting noted at this time. 09/05/17 on evaluation today patient appears to be doing better in regard to his left lateral lower extremity ulcer. He has been tolerating the dressing changes without complication. He is using Santyl  with good effect. Overall I'm very pleased with how things are standing at this point. Patient likewise is happy that this is doing better. 09/19/17 on evaluation today patient actually appears to be doing rather well in regard to his left lateral lower extremity ulcer. 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 He's not having any significant pain. 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 He has been tolerating the Santyl without complication and in general I feel like he's making good progress. 10/17/17 on evaluation today patient appears to be doing very well in regard to his left lateral lower surety ulcer. He has been tolerating the dressing changes without complication. There does not appear to be any evidence of infection he's alternating the Santyl and the triple antibiotic ointment every other day this seems to be doing well for him. 11/03/17 on evaluation today patient appears to be doing very well in regard to his left lateral lower extremity ulcer. He is been tolerating the dressing changes without complication which is good news. Fortunately there does not appear to be any evidence of infection which is also great news. Overall is doing excellent they are starting to taper down on the prednisone is down to 40 mg at this point it also started topical clobetasol for him. 11/17/17 on evaluation today patient appears to be doing well  in regard to his left lateral lower surety ulcer. He's been tolerating the dressing changes without complication. He does note that he is having no pain, no excessive drainage or discharge, and overall he feels like things are going about how he would expect and hope they would. Overall he seems to have no evidence of infection at this time in my opinion which is good  news. 12/04/17-He is seen in follow-up evaluation for right lateral lower extremity ulcer. He has been applying topical steroid cream. Today's measurement show slight increase in size. Over the next 2 weeks we will transition to every other day Santyl and steroid cream. He has been encouraged to monitor for changes and notify clinic with any concerns 12/15/17 on evaluation today patient's left lateral motion the ulcer and fortunately is doing worse again at this point. This just since last week to this week has close to doubled in size according to the patient. I did not seeing last week's I do not have a visual to compare this to in our system was also down so we do not have all the charts and at this point. Nonetheless it does have me somewhat concerned in regard to the fact that again he was worried enough about it he has contact the dermatology that placed them back on the full strength, 50 mg a day of the prednisone that he was taken previous. He continues to alternate using clobetasol along with Santyl at this point. He is obviously somewhat frustrated. 12/22/17 on evaluation today patient appears to be doing a little worse compared to last evaluation. Unfortunately the wound is a little deeper and slightly larger than the last week's evaluation. With that being said he has made some progress in regard to the irritation surrounding at this time unfortunately despite that progress that's been made he still has a significant issue going on here. I'm not certain that he is having really any true infection at this time although with the Pyoderma gangrenosum it can Sabine, Jamaurion ([id number]) sometimes be difficult to differentiate infection versus just inflammation. For that reason I discussed with him today the possibility of perform a wound culture to ensure there's nothing overtly infected. 01/06/18 on evaluation today patient's wound is larger and deeper than previously evaluated. With that being  said it did appear that his wound was infected after my last evaluation with him. Subsequently I did end up prescribing a prescription for Bactrim DS which she has been taking and having no complication with. Fortunately there does not appear to be any evidence of infection at this point in time as far as anything spreading, no want to touch, and overall I feel like things are showing signs of improvement. 01/13/18 on evaluation today patient appears to be even a little larger and deeper than last time. There still muscle exposed in the base of the wound. Nonetheless he does appear to be less erythematous I do believe inflammation is calming down also believe the infection looks like it's probably resolved at this time based on what I'm seeing. No fevers, chills, nausea, or vomiting noted at this time. 01/30/18 on evaluation today patient actually appears to visually look better for the most part. Unfortunately those visually this looks better he does seem to potentially have what may be an abscess in the muscle that has been noted in the central portion of the wound. This is the first time that I have noted what appears to be fluctuance in the central portion of the  muscle. With that being said I'm somewhat more concerned about the fact that this might indicate an abscess formation at this location. I do believe that an ultrasound would be appropriate. This is likely something we need to try to do as soon as possible. He has been switch to mupirocin ointment and he is no longer using the steroid ointment as prescribed by dermatology he sees them again next week he's been decreased from 60 to 40 mg of prednisone. 03/09/18 on evaluation today patient actually appears to be doing a little better compared to last time I saw him. There's not as much erythema surrounding the wound itself. He I did review his most recent infectious disease note which was dated 02/24/18. He saw Dr. Michel Bickers in  [place]. 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 Patient is now off of antibiotics and they are continually observing this. There seems to be no change in the past two weeks in my pinion based on what the patient says and what I see today compared to what Dr. Megan Salon likely saw two weeks ago. No fevers, chills, nausea, or vomiting noted at this time. 03/23/18 on evaluation today patient's wound actually appears to be showing signs of improvement which is good news. He is currently still on the Dapsone. He is also working on tapering the prednisone to get off of this and Dr. Lottie Rater is working with him in this regard. Nonetheless overall I feel like the wound is doing well it does appear based on the infectious disease note that I reviewed from Dr. 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 I did also review the results of his ultrasound of left lower extremity which revealed there was a dentist tissue in the base of the wound without an abscess noted. 04/06/18 on evaluation today the patient's left lateral lower extremity ulcer actually appears to be doing fairly well which is excellent news. There does not appear to be any evidence of infection at this time which is also great news. Overall he still does have a significantly large ulceration although little by little he seems to be making progress. He is down to 10 mg a day of the prednisone. 04/20/18 on evaluation today patient actually appears to be doing excellent at this time in regard to his left lower extremity ulcer. He's making signs of good progress unfortunately this is taking much longer than we would really like to see but nonetheless he is making progress. Fortunately there does not appear to be any evidence of infection at this  time. No fevers, chills, nausea, or vomiting noted at this time. The patient has not been using the Santyl due to the cost he hadn't got in this field yet. He's mainly been using the antibiotic ointment topically. Subsequently he also tells me that he really has not been scrubbing in the shower I think this would be helpful again as I told him it doesn't have to be anything too aggressive to even make it believe just enough to keep it free of some of the loose slough and biofilm on the wound surface. 05/11/18 on evaluation today patient's wound appears to be making slow but sure progress in regard to the left lateral lower extremity ulcer. He is been tolerating the dressing changes  without complication. Fortunately there does not appear to be any evidence of infection at this time. He is still just using triple antibiotic ointment along with clobetasol occasionally over the area. He never got the Santyl and really does not seem to intend to in my pinion. 06/01/18 on evaluation today patient appears to be doing a little better in regard to his left lateral lower extremity ulcer. He states that overall he does not feel like he is doing as well with the Dapsone as he did with the prednisone. Nonetheless he sees his dermatologist later today and is gonna talk to them about the possibility of going back on the prednisone. Overall again I believe that the wound would be better if you would utilize Santyl but he really does not seem to be interested in going back to the [employer] at this point. He has been using triple antibiotic ointment. SHAKIL, DIRK ([id number]) 06/15/18 on evaluation today patient's wound actually appears to be doing about the same at this point. Fortunately there is no signs of infection at this time. He has made slight improvements although he continues to not really want to clean the wound bed at this point. He states that he just doesn't mess with it he doesn't want to cause any  problems with everything else he has going on. He has been on medication, antibiotics as prescribed by his dermatologist, for a staff infection of his lower extremities which is really drying out now and looking much better he tells me. Fortunately there is no sign of overall infection. 06/29/18 on evaluation today patient appears to be doing well in regard to his left lateral lower surety ulcer all things considering. Fortunately his staff infection seems to be greatly improved compared to previous. He has no signs of infection and this is drying up quite nicely. He is still the doxycycline for this is no longer on cental, Dapsone, or any of the other medications. His dermatologist has recommended possibility of an infusion but right now he does not want to proceed with that. 07/13/18 on evaluation today patient appears to be doing about the same in regard to his left lateral lower surety ulcer. Fortunately there's no signs of infection at this time which is great news. Unfortunately he still builds up a significant amount of Slough/biofilm of the surface of the wound he still is not really cleaning this as he should be appropriately. Again I'm able to easily with saline and gauze remove the majority of this on the surface which if you would do this at home would likely be a dramatic improvement for him as far as getting the area to improve. Nonetheless overall I still feel like he is making progress is just very slow. I think Santyl will be of benefit for him as well. Still he has not gotten this as of this point. 07/27/18 on evaluation today patient actually appears to be doing little worse in regards of the erythema around the periwound region of the wound he also tells me that he's been having more drainage currently compared to what he was experiencing last time I saw him. He states not quite as bad as what he had because this was infected previously but nonetheless is still appears to be doing  poorly. Fortunately there is no evidence of systemic infection at this point. The patient tells me that he is not going to be able to afford the Santyl. He is still waiting to hear about the infusion therapy with his  dermatologist. Apparently she wants an updated colonoscopy first. 08/10/18 on evaluation today patient appears to be doing better in regard to his left lateral lower extremity ulcer. Fortunately he is showing signs of improvement in this regard he's actually been approved for Remicade infusion's as well although this has not been scheduled as of yet. Fortunately there's no signs of active infection at this time in regard to the wound although he is having some issues with infection of the right lower extremity is been seen as dermatologist for this. Fortunately they are definitely still working with him trying to keep things under control. 09/07/18 on evaluation today patient is actually doing rather well in regard to his left lateral lower extremity ulcer. He notes these actually having some hair grow back on his extremity which is something he has not seen in years. He also tells me that the pain is really not giving them any trouble at this time which is also good news overall she is very pleased with the progress he's using a combination of the mupirocin along with the probate is all mixed. 09/21/18 on evaluation today patient actually appears to be doing fairly well all things considered in regard to his looks from the ulcer. He's been tolerating the dressing changes without complication. Fortunately there's no signs of active infection at this time which is good news he is still on all antibiotics or prevention of the staff infection. He has been on prednisone for time although he states it is gonna contact his dermatologist and see if she put them on a short course due to some irritation that he has going on currently. Fortunately there's no evidence of any overall worsening this is  going very slow I think cental would be something that would be helpful for him although he states that $50 for tube is quite expensive. He therefore is not willing to get that at this point. 10/06/18 on evaluation today patient actually appears to be doing decently well in regard to his left lateral leg ulcer. He's been tolerating the dressing changes without complication. Fortunately there's no signs of active infection at this time. Overall I'm actually rather pleased with the progress he's making although it's slow he doesn't show any signs of infection and he does seem to be making some improvement. I do believe that he may need a switch up and dressings to try to help this to heal more appropriately and quickly. 10/19/18 on evaluation today patient actually appears to be doing better in regard to his left lateral lower extremity ulcer. This is shown signs of having much less Slough buildup at this point due to the fact he has been using the [employer]. Obviously this is very good news. The overall size of the wound is not dramatically smaller but again the appearance is. 11/02/18 on evaluation today patient actually appears to be doing quite well in regard to his lower [place] ulcer. A lot of the skin around the ulcer is actually somewhat irritating at this point this seems to be more due to the dressing causing irritation from the adhesive that anything else. Fortunately there is no signs of active infection at this time. 11/24/18 on evaluation today patient appears to be doing a little worse in regard to his overall appearance of his lower extremity Souders, Issachar ([id number]) ulcer. There's more erythema and warmth around the wound unfortunately. He is currently on doxycycline which he has been on for some time. With that being said I'm not sure that  seems to be helping with what appears to possibly be an acute cellulitis with regard to his left lower extremity ulcer. No fevers, chills, nausea, or  vomiting noted at this time. 12/08/18 on evaluation today patient's wounds actually appears to be doing significantly better compared to his last evaluation. He has been using Santyl along with alternating tripling about appointment as well as the steroid cream seems to be doing quite well and the wound is showing signs of improvement which is excellent news. Fortunately there's no evidence of infection and in fact his culture came back negative with only normal skin flora noted. 12/21/2018 upon evaluation today patient actually appears to be doing excellent with regard to his ulcer. This is actually the best that I have seen it since have been helping to take care of him. It is both smaller as well as less slough noted on the surface of the wound and seems to be showing signs of good improvement with new skin growing from the edges. He has been using just the triamcinolone he does wonder if he can get a refill of that ointment today. 01/04/2019 upon evaluation today patient actually appears to be doing well with regard to his left lateral lower extremity ulcer. With that being said it does not appear to be that he is doing quite as well as last time as far as progression is concerned. There does not appear to be any signs of infection or significant irritation which is good news. With that being said I do believe that he may benefit from switching to a collagen based dressing based on how clean The wound appears. 01/18/2019 on evaluation today patient actually appears to be doing well with regard to his wound on the left lower extremity. He is not made a lot of progress compared to where we were previous but nonetheless does seem to be doing okay at this time which is good news. There is no signs of active infection which is also good news. My only concern currently is I do wish we can get him into utilizing the collagen dressing his insurance would not pay for the supplies that we ordered although  it appears that he may be able to order this through his supply company that he typically utilizes. This is [employer]. Nonetheless he did try to order it during the office visit today and it appears this did go through. We will see if he can get that it is a different brand but nonetheless he has collagen and I do think will be beneficial. 02/01/2019 on evaluation today patient actually appears to be doing a little worse today in regard to the overall size of his wounds. Fortunately there is no signs of active infection at this time. That is visually. Nonetheless when this is happened before it was due to infection. For that reason were somewhat concerned about that this time as well. 02/08/2019 on evaluation today patient unfortunately appears to be doing slightly worse with regard to his wound upon evaluation today. Is measuring a little deeper and a little larger unfortunately. I am not really sure exactly what is causing this to enlarge he actually did see his dermatologist she is going to see about initiating Humira for him. Subsequently she also did do steroid injections into the wound itself in the periphery. Nonetheless still nonetheless he seems to be getting a little bit larger he is gone back to just using the steroid cream topically which I think is appropriate. I would say  hold off on the collagen for the time being is definitely a good thing to do. Based on the culture results which we finally did get the final result back regarding it shows staph as the bacteria noted again that can be a normal skin bacteria based on the fact however he is having increased drainage and worsening of the wound measurement wise I would go ahead and place him on an antibiotic today I do believe for this. 02/15/2019 on evaluation today patient actually appears to be doing somewhat better in regard to his ulcer. There is no signs of worsening at this time I did review his culture results which showed evidence  of Staphylococcus aureus but not MRSA. Again this could just be more related to the normal skin bacteria although he states the drainage has slowed down quite a bit he may have had a mild infection not just colonization. And was much smaller and then since around10/04/2019 on evaluation today patient appears to be doing unfortunately worse as far as the size of the wound. I really feel like that this is steadily getting larger again it had been doing excellent right at the beginning of September we have seen a steady increase in the area of the wound it is almost 2-1/2 times the size it was on September 1. Obviously this is a bad trend this is not wanting to see. For that reason we went back to using just the topical triamcinolone cream which does seem to help with inflammation. I checked him for bacteria by way of culture and nothing showed positive there. I am considering giving him a short course of a tapering steroid Dosepak today to see if that is can be beneficial for him. The patient is in agreement with giving that a try. 03/08/2019 on evaluation today patient appears to be doing very well in comparison to last evaluation with regard to his lower extremity ulcer. This is showing signs of less inflammation and actually measuring slightly smaller compared to last time every other week over the past month and a half he has been measuring larger larger larger. Nonetheless I do believe that the issue has been inflammation the prednisone does seem to have been beneficial for him which is good news. No fevers, chills, nausea, vomiting, or diarrhea. MERRIL, NAGY ([id number]) 03/22/2019 on evaluation today patient appears to be doing about the same with regard to his leg ulcer. He has been tolerating the dressing changes without complication. With that being said the wound seems to be mostly arrested at its current size but really is not making any progress except for when we prescribed the  prednisone. He did show some signs of dropping as far as the overall size of the wound during that interval week. Nonetheless this is something he is not on long-term at this point and unfortunately I think he is getting need either this or else the Humira which his dermatologist has discussed try to get approval for. With that being said he will be seeing his dermatologist on the 11th of this month that is November. 04/19/2019 on evaluation today patient appears to be doing really about the same the wound is measuring slightly larger compared to last time I saw him. He has not been into the office since November 2 due to the fact that he unfortunately had Covid as that his entire family. He tells me that it was rough but they did pull-through and he seems to be doing much better. Fortunately there is  no signs of active infection at this time. No fevers, chills, nausea, vomiting, or diarrhea. 05/10/2019 on evaluation today patient unfortunately appears to be doing significantly worse as compared to last time I saw him. He does tell me that he has had his first dose of Humira and actually is scheduled to get the next one in the upcoming week. With that being said he tells me also that in the past several days he has been having a lot of issues with green drainage she showed me a picture this is more blue-green in color. He is also been having issues with increased sloughy buildup and the wound does appear to be larger today. Obviously this is not the direction that we want everything to take based on the starting of his Humira. Nonetheless I think this is definitely a result of likely infection and to be honest I think this is probably Pseudomonas causing the infection based on what I am seeing. 05/24/2019 on evaluation today patient unfortunately appears to be doing significantly worse compared to his prior evaluation with me 2 weeks ago. I did review his culture results which showed that he does have  Staph aureus as well as Pseudomonas noted on the culture. Nonetheless the Levaquin that I prescribed for him does not appear to have been appropriate and in fact he tells me he is no longer experiencing the green drainage and discharge that he had at the last visit. Fortunately there is no signs of active infection at this time which is good news although the wound has significantly worsened it in fact is much deeper than it was previous. We have been utilizing up to this point triamcinolone ointment as the prescription topical of choice but at this time I really feel like that the wound is getting need to be packed in order to appropriately manage this due to the deeper nature of the wound. Therefore something along the lines of an alginate dressing may be more appropriate. 05/31/2019 upon inspection today patient's wound actually showed signs of doing poorly at this point. Unfortunately he just does not seem to be making any good progress despite what we have tried. He actually did go ahead and pick up the Cipro and start taking that as he was noticing more green drainage he had previously completed the Levaquin that I prescribed for him as well. Nonetheless he missed his appointment for the seventh last week on Wednesday with the wound care center and [facility] where his dermatologist referred him. Obviously I do think a second opinion would be helpful at this point especially in light of the fact that the patient seems to be doing so poorly despite the fact that we have tried everything that I really know how at this point. The only thing that ever seems to have helped him in the past is when he was on high doses of continual steroids that did seem to make a difference for him. 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 06/07/2019 upon  evaluation today patient unfortunately appears to be doing worse yet again with regard to his wound. In fact I am starting to question whether or not he may have a fluid pocket in the muscle at this point based on the bulging and the soft appearance to the central portion  of the muscle area. There is not anything draining from the muscle itself at this time which is good news but nonetheless the wound is expanding. I am not really seeing any results of the Humira as far as overall wound progression based on what I am seeing at this point. The patient has been referred for second opinion with regard to his wound to the [facility] wound care center by his dermatologist which I definitely am not in opposition to. Unfortunately we tried multiple dressings in the past including collagen, alginate, and at one point even Hydrofera Blue. With that being said he is never really used it for any significant amount of time due to the fact that he often complains of pain associated with these dressings and then will go back to either using the Santyl which she has done intermittently or more frequently the triamcinolone. He is also using his own compression stockings. We have wrapped him in the past but again that was something else that he really was not a big fan of. 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 He seemed to do much better when he was on the steroids although honestly I understand that the steroids are not necessarily the best medication to be on long-term obviously 06/14/2019 on evaluation today patient appears to be doing actually a little bit better with regard to the overall  appearance with his leg. Unfortunately he does continue to have issues with what appears to be some fluid underneath the muscle although he did see the wound specialty center at [facility] last week their main goals were to see about infusion therapy in place of the Humira as they feel like that is not quite strong enough. They also recommended that we continue with the treatment [name], [name] ([id number]) otherwise as we are they felt like that was appropriate and they are okay with him continuing to follow-up here with [place] in that regard. With that being said they are also sending him to the vein specialist there to see about vein stripping and if that would be of benefit for him. Subsequently they also did not really address whether or not an ultrasound of the muscle area to see if there is anything that needs to be addressed here would be appropriate or not. For that reason I discussed this with him last week I think we may proceed down that road at this point. 06/21/2019 upon evaluation today patient's wound actually appears to be doing slightly better compared to previous evaluations. I do believe that he has made a difference with regard to the progression here with the use of oral steroids. Again in the past has been the only thing that is really calm things down. He does tell me that from [facility] is gotten a good news from there that there are no further vein stripping that is necessary at this point. I do not have that available for review today although the patient did relay this to me. He also did obtain and have the ultrasound of the wound completed which I did sign off on today. It does appear that there is no fluid collection under the  muscle this is likely then just edematous tissue in general. That is also good news. Overall I still believe the inflammation is the main issue here. He did inquire about the possibility of a wound VAC again with the muscle protruding like it is I am not really sure  whether the wound VAC is necessarily ideal or not. That is something we will have to consider although I do believe he may need compression wrapping to try to help with edema control which could potentially be of benefit. 06/28/2019 on evaluation today patient appears to be doing slightly better measurement wise although this is not terribly smaller he least seems to be trending towards that direction. With that being said he still seems to have purulent drainage noted in the wound bed at this time. He has been on Levaquin followed by Cipro over the past month. Unfortunately he still seems to have some issues with active infection at this time. I did perform a culture last week in order to evaluate and see if indeed there was still anything going on. Subsequently the culture did come back showing Pseudomonas which is consistent with the drainage has been having which is blue-green in color. He also has had an odor that again was somewhat consistent with Pseudomonas as well. Long story short it appears that the culture showed an intermediate finding with regard to how well the Cipro will work for the Pseudomonas infection. Subsequently being that he does not seem to be clearing up and at best what we are doing is just keeping this at [place] I think he may need to see infectious disease to discuss IV antibiotic options. Objective Constitutional Obese and well-hydrated in no acute distress. Vitals Time Taken: 8:31 AM, Height: 71 in, Weight: 338 lbs, BMI: 47.1, Temperature: 98.3 F, Pulse: 78 bpm, Respiratory Rate: 16 breaths/min, Blood Pressure: 144/81 mmHg. Respiratory normal breathing without difficulty. Psychiatric this patient is able to make decisions and demonstrates good insight into disease process. Alert and Oriented x 3. pleasant and cooperative. General Notes: Upon inspection patient's wound bed actually showed signs of still significant purulent drainage noted which is blue-green in  color. He also continues to have muscle exposed in the base of the wound. This is still not showing signs of dramatically worsening from the standpoint of size over the past several weeks though prior to that he was continually getting larger and larger. We do seem to have arrested some of that enlargement with the use of the steroid for short time. Nonetheless I still believe that he seems to have evidence of active infection not just colonization based on the fact that he has purulent drainage from blue-green coloration to it. Again I do think this is consistent with Pseudomonas that is also what the culture showed but the Cipro/Levaquin at this point appears to be intermediate at best as far as treating it and I do not Likes, Edison ([id number]) think has been strong enough to really knock this out. Integumentary (Hair, Skin) Wound #1 status is Open. Original cause of wound was Gradually Appeared. The wound is located on the Left,Lateral Lower Leg. The wound measures 5.6cm length x 5.4cm width x 0.9cm depth; 23.75cm^2 area and 21.375cm^3 volume. There is muscle and Fat Layer (Subcutaneous Tissue) Exposed exposed. There is a medium amount of serous drainage noted. The wound margin is distinct with the outline attached to the wound base. There is small (1-33%) pink granulation within the wound bed. There is a large (67-100%) amount of  necrotic tissue within the wound bed including Adherent Slough. 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 Anesthetic (add to Medication List): Wound #1 Left,Lateral Lower Leg: Topical Lidocaine 4% cream applied to wound bed prior to debridement (In Clinic Only). Skin Barriers/Peri-Wound Care: Wound #1 Left,Lateral Lower  Leg: Triamcinolone Acetonide Ointment (TCA) Primary Wound Dressing: Dry Gauze Secondary Dressing: Wound #1 Left,Lateral Lower Leg: Boardered Foam Dressing Dressing Change Frequency: Wound #1 Left,Lateral Lower Leg: Change dressing every other day. Follow-up Appointments: Wound #1 Left,Lateral Lower Leg: Return Appointment in 1 week. Edema Control: Wound #1 Left,Lateral Lower Leg: Elevate legs to the level of the heart and pump ankles as often as possible Consults ordered were: Infectious Disease [facility] for IV Abx The following medication(s) was prescribed: Levaquin oral 750 mg tablet 1 1 tablet oral taken daily for 14 days starting 06/28/2019 JAQUARIOUS, GREY ([id number]) 1. My suggestion at this time is good to be to make a referral to infectious disease to see if we can get this cleared up completely as far as the infection is concerned and the patient is in agreement with that plan. We will go ahead and make that referral. In the meantime I will give him a prescription for Levaquin to try to help at least keep things at base and hopefully nothing gets worse until he can see infectious disease. 2. Open recommend as well the patient continue with the current wound care measures otherwise specifically with regard to the triamcinolone topically along with the gauze and then bordered foam dressing. We tried multiple other dressings unfortunately he seemed to have issues with those I do believe he could benefit from potentially an alginate dressing but again right now the triamcinolone is what he would like to stick with as it seems to have done the best. He just needs to use a thin film as again we do not want anything to moisturize it added to the wound bed as he is already having a significant amount of drainage right now purulent in color. 3. I do recommend the patient continue to as much as possible clean the area with warm soap and water whenever he changes the dressings  I think this is absolutely appropriate. We will see patient back for reevaluation in 1 week here in the clinic. If anything worsens or changes patient will contact our office for additional recommendations. Electronic Signature(s) Signed: 06/28/2019 2:18:18 PM By: Worthy Keeler PA-C Entered By: Worthy Keeler on 06/28/2019 14:18:18 HORACIO, WERTH ([id number]) -------------------------------------------------------------------------------- SuperBill Details Patient Name: [name] Date of Service: 06/28/2019 Medical Record Number: [id number] Patient Account Number: [id number] Date of Birth/Sex: [birth year] (39 y.o. 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

## 2019-06-29 NOTE — Progress Notes (Signed)
COMPTON, BRIGANCE ([id number]) Visit Report for 06/28/2019 Arrival Information Details Patient Name: [name], [name] Date of Service: 06/28/2019 8:15 AM Medical Record Number: [id number] Patient Account Number: [id number] Date of Birth/Sex: 1981-03-25 (39 y.o. M) Treating RN: Montey Hora Primary Care Mykelti Goldenstein: [name] Other Clinician: Referring Kristie Bracewell: [name] Treating Mell Mellott/Extender: Melburn Hake, [name]: 35 Visit Information History Since Last Visit Added or deleted any medications: No Patient Arrived: Ambulatory Any new allergies or adverse reactions: No Arrival Time: 08:30 Had a fall or experienced change in No Accompanied By: self activities of daily living that may affect Transfer Assistance: None risk of falls: Patient Identification Verified: Yes Signs or symptoms of abuse/neglect since last visito No Patient Requires Transmission-Based No Hospitalized since last visit: No Precautions: Has Dressing in Place as Prescribed: Yes Patient Has Alerts: No Pain Present Now: Yes Electronic Signature(s) Signed: 06/28/2019 4:23:54 PM By: Montey Hora Entered By: Montey Hora on 06/28/2019 08:31:11 [name] ([id number]) -------------------------------------------------------------------------------- Clinic Level of Care Assessment Details Patient Name: [name] Date of Service: 06/28/2019 8:15 AM Medical Record Number: [id number] Patient Account Number: [id number] Date of Birth/Sex: 04-06-1981 (39 y.o. M) Treating RN: [name] Primary Care Gara Kincade: [name] Other Clinician: Referring Markies Mowatt: [name] Treating Anthem Frazer/Extender: Melburn Hake, [name]: 133 Clinic Level of Care Assessment Items TOOL 4 Quantity Score []  - Use when only an EandM is performed on FOLLOW-UP visit 0 ASSESSMENTS - Nursing Assessment / Reassessment []  - Reassessment of Co-morbidities (includes updates in patient status) 0 X- 1  5 Reassessment of Adherence to Treatment Plan ASSESSMENTS - [name] and Skin Assessment / Reassessment X - Simple [name] Assessment / Reassessment - one [name] 1 5 []  - 0 Complex [name] Assessment / Reassessment - multiple wounds []  - 0 Dermatologic / Skin Assessment (not related to [name] area) ASSESSMENTS - Focused Assessment []  - Circumferential Edema Measurements - multi extremities 0 []  - 0 Nutritional Assessment / Counseling / Intervention []  - 0 Lower Extremity Assessment (monofilament, tuning fork, pulses) []  - 0 Peripheral Arterial Disease Assessment (using hand held doppler) ASSESSMENTS - Ostomy and/or Continence Assessment and Care []  - Incontinence Assessment and Management 0 []  - 0 Ostomy Care Assessment and Management (repouching, etc.) PROCESS - Coordination of Care X - Simple Patient / Family Education for ongoing care 1 15 []  - 0 Complex (extensive) Patient / Family Education for ongoing care []  - 0 Staff obtains Programmer, systems, Records, Test Results / Process Orders []  - 0 Staff telephones HHA, Nursing Homes / Clarify orders / etc []  - 0 Routine Transfer to another Facility (non-emergent condition) []  - 0 Routine Hospital Admission (non-emergent condition) []  - 0 New Admissions / Biomedical engineer / Ordering NPWT, Apligraf, etc. []  - 0 Emergency Hospital Admission (emergent condition) X- 1 10 Simple Discharge Coordination [name] ([id number]) []  - 0 Complex (extensive) Discharge Coordination PROCESS - Special Needs []  - Pediatric / Minor Patient Management 0 []  - 0 Isolation Patient Management []  - 0 Hearing / Language / Visual special needs []  - 0 Assessment of Community assistance (transportation, D/C planning, etc.) []  - 0 Additional assistance / Altered mentation []  - 0 Support Surface(s) Assessment (bed, cushion, seat, etc.) INTERVENTIONS - [name] Cleansing / Measurement X - Simple [name] Cleansing - one [name] 1 5 []  - 0 Complex [name]  Cleansing - multiple wounds X- 1 5 [name] Imaging (photographs - any number of wounds) []  - 0 [name] Tracing (instead of photographs) X- 1 5 Simple [name] Measurement -  one [name] []  - 0 Complex [name] Measurement - multiple wounds INTERVENTIONS - [name] Dressings []  - Small [name] Dressing one or multiple wounds 0 []  - 0 Medium [name] Dressing one or multiple wounds X- 1 20 Large [name] Dressing one or multiple wounds []  - 0 Application of Medications - topical []  - 0 Application of Medications - injection INTERVENTIONS - Miscellaneous []  - External ear exam 0 []  - 0 Specimen Collection (cultures, biopsies, blood, body fluids, etc.) []  - 0 Specimen(s) / Culture(s) sent or taken to Lab for analysis []  - 0 Patient Transfer (multiple staff / Civil Service fast streamer / Similar devices) []  - 0 Simple Staple / Suture removal (25 or less) []  - 0 Complex Staple / Suture removal (26 or more) []  - 0 Hypo / Hyperglycemic Management (close monitor of Blood Glucose) []  - 0 Ankle / Brachial Index (ABI) - do not check if billed separately X- 1 5 Vital Signs [name], [name] ([id number]) Has the patient been seen at the hospital within the last three years: Yes Total Score: 75 Level Of Care: New/Established - Level 2 Electronic Signature(s) Signed: 06/29/2019 4:33:39 PM By: [name], [name], RN, [name], [name], [name] Entered By: [name], [name], RN, [name], [name] on 06/29/2019 12:33:43 [name] ([id number]) -------------------------------------------------------------------------------- Encounter Discharge Information Details Patient Name: [name] Date of Service: 06/28/2019 8:15 AM Medical Record Number: [id number] Patient Account Number: [id number] Date of Birth/Sex: December 03, 1980 (39 y.o. M) Treating RN: [name] Primary Care Nyiah Pianka: [name] Other Clinician: Referring Noami Bove: [name] Treating Stephenie Navejas/Extender: Melburn Hake, [name]: 56 Encounter Discharge Information  Items Discharge Condition: Stable Ambulatory Status: Ambulatory Discharge Destination: Home Transportation: Private Auto Accompanied By: self Schedule Follow-up Appointment: Yes Clinical Summary of Care: Electronic Signature(s) Signed: 06/29/2019 4:33:39 PM By: [name], [name], RN, [name], [name], [name] Entered By: [name], [name], RN, [name], [name] on 06/28/2019 09:14:04 [name] ([id number]) -------------------------------------------------------------------------------- Lower Extremity Assessment Details Patient Name: [name] Date of Service: 06/28/2019 8:15 AM Medical Record Number: [id number] Patient Account Number: [id number] Date of Birth/Sex: August 29, 1980 (39 y.o. M) Treating RN: Montey Hora Primary Care Romonia Yanik: [name] Other Clinician: Referring Clair Alfieri: [name] Treating Jessicca Stitzer/Extender: STONE III, [name]: 133 Edema Assessment Assessed: [Left: No] [Right: No] Edema: [Left: N] [Right: o] Vascular Assessment Pulses: Dorsalis Pedis Palpable: [Left:Yes] Electronic Signature(s) Signed: 06/28/2019 4:23:54 PM By: Montey Hora Entered By: Montey Hora on 06/28/2019 08:37:11 [name], [name] ([id number]) -------------------------------------------------------------------------------- Multi [name] Chart Details Patient Name: [name] Date of Service: 06/28/2019 8:15 AM Medical Record Number: [id number] Patient Account Number: [id number] Date of Birth/Sex: 1981/02/02 (39 y.o. M) Treating RN: [name] Primary Care Bintou Lafata: [name] Other Clinician: Referring Demetric Parslow: [name] Treating Hjalmer Iovino/Extender: Melburn Hake, [name]: 133 Vital Signs Height(in): 71 Pulse(bpm): 15 Weight(lbs): 338 Blood Pressure(mmHg): 144/81 Body Mass Index(BMI): 47 Temperature(F): 98.3 Respiratory Rate 16 (breaths/min): Photos: [N/A:N/A] [name] Location: Left Lower Leg - Lateral N/A N/A Wounding Event: Gradually Appeared N/A  N/A Primary Etiology: Pyoderma N/A N/A Comorbid History: Sleep Apnea, Hypertension, N/A N/A Colitis Date Acquired: 11/18/2015 N/A N/A Weeks of Treatment: 133 N/A N/A [name] Status: Open N/A N/A Measurements L x W x D 5.6x5.4x0.9 N/A N/A (cm) Area (cm) : 23.75 N/A N/A Volume (cm) : 21.375 N/A N/A % Reduction in Area: -383.80% N/A N/A % Reduction in Volume: -444.30% N/A N/A Classification: Full Thickness With Exposed N/A N/A Support Structures Exudate Amount: Medium N/A N/A Exudate Type: Serous N/A N/A Exudate Color: amber N/A N/A [name] Margin:  Distinct, outline attached N/A N/A Granulation Amount: Small (1-33%) N/A N/A Granulation Quality: Pink N/A N/A Necrotic Amount: Large (67-100%) N/A N/A Exposed Structures: Fat Layer (Subcutaneous N/A N/A Tissue) Exposed: Yes Muscle: Yes Fascia: No Tendon: No [name], [name] ([id number]) Joint: No Bone: No Epithelialization: None N/A N/A Treatment Notes Electronic Signature(s) Signed: 06/29/2019 4:33:39 PM By: [name], [name], RN, [name], [name], [name] Entered By: [name], [name], RN, [name], [name] on 06/28/2019 09:11:14 [name] ([id number]) -------------------------------------------------------------------------------- Multi-Disciplinary Care Plan Details Patient Name: [name] Date of Service: 06/28/2019 8:15 AM Medical Record Number: [id number] Patient Account Number: [id number] Date of Birth/Sex: 11-Dec-1980 (39 y.o. M) Treating RN: [name] Primary Care Lysbeth Dicola: [name] Other Clinician: Referring Bell Carbo: [name] Treating Sosaia Pittinger/Extender: Melburn Hake, [name]: 59 Active Inactive Orientation to the [name] Nursing Diagnoses: Knowledge deficit related to the [name] program Goals: Patient/caregiver will verbalize understanding of the [facility] Date Initiated: 12/11/2016 Target Resolution Date: 03/13/2017 Goal Status: Active Interventions: Provide education on  orientation to the [name] Notes: Venous Leg Ulcer Nursing Diagnoses: Knowledge deficit related to disease process and management Potential for venous Insuffiency (use before diagnosis confirmed) Goals: Non-invasive venous studies are completed as ordered Date Initiated: 12/11/2016 Target Resolution Date: 03/13/2017 Goal Status: Active Patient will maintain optimal edema control Date Initiated: 12/11/2016 Target Resolution Date: 03/13/2017 Goal Status: Active Patient/caregiver will verbalize understanding of disease process and disease management Date Initiated: 12/11/2016 Target Resolution Date: 03/13/2017 Goal Status: Active Verify adequate tissue perfusion prior to therapeutic compression application Date Initiated: 12/11/2016 Target Resolution Date: 03/13/2017 Goal Status: Active Interventions: Assess peripheral edema status every visit. Compression as ordered Provide education on venous insufficiency Treatment Activities: [name], [name] ([id number]) Therapeutic compression applied : 12/11/2016 Notes: [name]/Skin Impairment Nursing Diagnoses: Impaired tissue integrity Knowledge deficit related to ulceration/compromised skin integrity Goals: Patient/caregiver will verbalize understanding of skin care regimen Date Initiated: 12/11/2016 Target Resolution Date: 03/13/2017 Goal Status: Active Ulcer/skin breakdown will have a volume reduction of 30% by week 4 Date Initiated: 12/11/2016 Target Resolution Date: 03/13/2017 Goal Status: Active Ulcer/skin breakdown will have a volume reduction of 50% by week 8 Date Initiated: 12/11/2016 Target Resolution Date: 03/13/2017 Goal Status: Active Ulcer/skin breakdown will have a volume reduction of 80% by week 12 Date Initiated: 12/11/2016 Target Resolution Date: 03/13/2017 Goal Status: Active Ulcer/skin breakdown will heal within 14 weeks Date Initiated: 12/11/2016 Target Resolution Date: 03/13/2017 Goal Status:  Active Interventions: Assess patient/caregiver ability to obtain necessary supplies Assess patient/caregiver ability to perform ulcer/skin care regimen upon admission and as needed Assess ulceration(s) every visit Provide education on ulcer and skin care Treatment Activities: Skin care regimen initiated : 12/11/2016 Topical [name] management initiated : 12/11/2016 Notes: Electronic Signature(s) Signed: 06/29/2019 4:33:39 PM By: [name], [name], RN, [name], [name], [name] Entered By: [name], [name], RN, [name], [name] on 06/28/2019 [name] ([id number]) -------------------------------------------------------------------------------- Pain Assessment Details Patient Name: [name] Date of Service: 06/28/2019 8:15 AM Medical Record Number: [id number] Patient Account Number: [id number] Date of Birth/Sex: 04/20/1981 (39 y.o. M) Treating RN: Montey Hora Primary Care Tyreonna Czaplicki: [name] Other Clinician: Referring Kemia Wendel: [name] Treating Elianny Buxbaum/Extender: Melburn Hake, [name]: 133 Active Problems Location of Pain Severity and Description of Pain Patient Has Paino Yes Site Locations Pain Location: Pain in Ulcers Rate the pain. Current Pain Level: 3 Pain Management and Medication Current Pain Management: Electronic Signature(s) Signed: 06/28/2019 4:23:54 PM By: Montey Hora Entered By: Montey Hora on 06/28/2019 08:31:18 [name], [name] (  [id number]) -------------------------------------------------------------------------------- [name], [name] Date of Service: 06/28/2019 8:15 AM Medical Record Number: [id number] Patient Account Number: [id number] Date of Birth/Sex: [birth year] (40 y.o. M) Treating RN: Montey Hora Primary Care Blade Scheff: [name] Other Clinician: Referring [name]: [name] Treating Tikia Skilton/Extender: Melburn Hake, [name]: 133 [name] Status [name] Number: 1 Primary Etiology:  Pyoderma [name] Location: Left Lower Leg - Lateral [name] Status: Open Wounding Event: Gradually Appeared Comorbid History: Sleep Apnea, Hypertension, Colitis Date Acquired: 11/18/2015 Weeks Of Treatment: 133 Clustered [name]: No Photos [name] Measurements Length: (cm) 5.6 Width: (cm) 5.4 Depth: (cm) 0.9 Area: (cm) 23.75 Volume: (cm) 21.375 % Reduction in Area: -383.8% % Reduction in Volume: -444.3% Epithelialization: None [name] Description Full Thickness With Exposed Support Foul Odor A Classification: Structures Slough/Fibr [name] Margin: Distinct, outline attached Exudate Medium Amount: Exudate Type: Serous Exudate Color: amber fter Cleansing: No ino Yes [name] Bed Granulation Amount: Small (1-33%) Exposed Structure Granulation Quality: Pink Fascia Exposed: No Necrotic Amount: Large (67-100%) Fat Layer (Subcutaneous Tissue) Exposed: Yes Necrotic Quality: Adherent Slough Tendon Exposed: No Muscle Exposed: Yes Necrosis of Muscle: No Joint Exposed: No Bone Exposed: No [name], [name] ([id number]) Treatment Notes [name] #1 (Left, Lateral Lower Leg) Notes TCA, dry gauze, BFD Electronic Signature(s) Signed: 06/28/2019 4:23:54 PM By: Montey Hora Entered By: Montey Hora on 06/28/2019 08:36:52 [name] ([id number]) -------------------------------------------------------------------------------- [place] Details Patient Name: [name] Date of Service: 06/28/2019 8:15 AM Medical Record Number: [id number] Patient Account Number: [id number] Date of Birth/Sex: Oct 06, 1980 (39 y.o. M) Treating RN: Montey Hora Primary Care Vallen Calabrese: [name] Other Clinician: Referring Ruhani Umland: [name] Treating Charell Faulk/Extender: Melburn Hake, [name]: 133 Vital Signs Time Taken: 08:31 Temperature (F): 98.3 Height (in): 71 Pulse (bpm): 78 Weight (lbs): 338 Respiratory Rate (breaths/min): 16 Body Mass Index (BMI): 47.1 Blood Pressure (mmHg):  144/81 Reference Range: 80 - 120 mg / dl Electronic Signature(s) Signed: 06/28/2019 4:23:54 PM By: Montey Hora Entered By: Montey Hora on 06/28/2019 08:32:46

## 2019-07-05 ENCOUNTER — Other Ambulatory Visit: Payer: Self-pay

## 2019-07-05 ENCOUNTER — Encounter: Payer: 59 | Admitting: Physician Assistant

## 2019-07-05 ENCOUNTER — Telehealth: Payer: Self-pay

## 2019-07-05 DIAGNOSIS — L97222 Non-pressure chronic ulcer of left calf with fat layer exposed: Secondary | ICD-10-CM | POA: Diagnosis not present

## 2019-07-05 NOTE — Progress Notes (Signed)
[name] ([id number]) Visit Report for 07/05/2019 Arrival Information Details Patient Name: [name] Date of Service: 07/05/2019 8:15 AM Medical Record Number: [id number] Patient Account Number: [id number] Date of Birth/Sex: 05-28-1980 (39 y.o. M) Treating RN: Army Melia Primary Care Keyarra Rendall: [name] Other Clinician: Referring Masayoshi Couzens: [name] Treating Janautica Netzley/Extender: Melburn Hake, [name]: 62 Visit Information History Since Last Visit Added or deleted any medications: No Patient Arrived: Ambulatory Any new allergies or adverse reactions: No Arrival Time: 08:14 Had a fall or experienced change in No Accompanied By: self activities of daily living that may affect Transfer Assistance: None risk of falls: Patient Identification Verified: Yes Signs or symptoms of abuse/neglect since last visito No Patient Requires Transmission-Based No Hospitalized since last visit: No Precautions: Has Dressing in Place as Prescribed: Yes Patient Has Alerts: No Pain Present Now: Yes Electronic Signature(s) Signed: 07/05/2019 10:47:42 AM By: Army Melia Entered By: Army Melia on 07/05/2019 08:14:59 [name] ([id number]) -------------------------------------------------------------------------------- Clinic Level of Care Assessment Details Patient Name: [name] Date of Service: 07/05/2019 8:15 AM Medical Record Number: [id number] Patient Account Number: [id number] Date of Birth/Sex: [birth year] (39 y.o. M) Treating RN: [name] Primary Care Teryl Mcconaghy: [name] Other Clinician: Referring Miley Lindon: [name] Treating Gina Costilla/Extender: Melburn Hake, [name]: 134 Clinic Level of Care Assessment Items TOOL 4 Quantity Score []  - Use when only an EandM is performed on FOLLOW-UP visit 0 ASSESSMENTS - Nursing Assessment / Reassessment X - Reassessment of Co-morbidities (includes updates in patient status) 1 10 X- 1  5 Reassessment of Adherence to Treatment Plan ASSESSMENTS - Wound and Skin Assessment / Reassessment X - Simple Wound Assessment / Reassessment - one wound 1 5 []  - 0 Complex Wound Assessment / Reassessment - multiple wounds []  - 0 Dermatologic / Skin Assessment (not related to wound area) ASSESSMENTS - Focused Assessment []  - Circumferential Edema Measurements - multi extremities 0 []  - 0 Nutritional Assessment / Counseling / Intervention []  - 0 Lower Extremity Assessment (monofilament, tuning fork, pulses) []  - 0 Peripheral Arterial Disease Assessment (using hand held doppler) ASSESSMENTS - Ostomy and/or Continence Assessment and Care []  - Incontinence Assessment and Management 0 []  - 0 Ostomy Care Assessment and Management (repouching, etc.) PROCESS - Coordination of Care X - Simple Patient / Family Education for ongoing care 1 15 []  - 0 Complex (extensive) Patient / Family Education for ongoing care X- 1 10 Staff obtains Programmer, systems, Records, Test Results / Process Orders []  - 0 Staff telephones HHA, Nursing Homes / Clarify orders / etc []  - 0 Routine Transfer to another Facility (non-emergent condition) []  - 0 Routine Hospital Admission (non-emergent condition) []  - 0 New Admissions / Biomedical engineer / Ordering NPWT, Apligraf, etc. []  - 0 Emergency Hospital Admission (emergent condition) []  - 0 Simple Discharge Coordination [name] ([id number]) X- 1 15 Complex (extensive) Discharge Coordination PROCESS - Special Needs []  - Pediatric / Minor Patient Management 0 []  - 0 Isolation Patient Management []  - 0 Hearing / Language / Visual special needs []  - 0 Assessment of Community assistance (transportation, D/C planning, etc.) []  - 0 Additional assistance / Altered mentation []  - 0 Support Surface(s) Assessment (bed, cushion, seat, etc.) INTERVENTIONS - Wound Cleansing / Measurement X - Simple Wound Cleansing - one wound 1 5 []  - 0 Complex Wound  Cleansing - multiple wounds X- 1 5 Wound Imaging (photographs - any number of wounds) []  - 0 Wound Tracing (instead of photographs) X- 1 5 Simple Wound Measurement -  one wound []  - 0 Complex Wound Measurement - multiple wounds INTERVENTIONS - Wound Dressings []  - Small Wound Dressing one or multiple wounds 0 []  - 0 Medium Wound Dressing one or multiple wounds X- 1 20 Large Wound Dressing one or multiple wounds []  - 0 Application of Medications - topical []  - 0 Application of Medications - injection INTERVENTIONS - Miscellaneous []  - External ear exam 0 []  - 0 Specimen Collection (cultures, biopsies, blood, body fluids, etc.) []  - 0 Specimen(s) / Culture(s) sent or taken to Lab for analysis []  - 0 Patient Transfer (multiple staff / Civil Service fast streamer / Similar devices) []  - 0 Simple Staple / Suture removal (25 or less) []  - 0 Complex Staple / Suture removal (26 or more) []  - 0 Hypo / Hyperglycemic Management (close monitor of Blood Glucose) []  - 0 Ankle / Brachial Index (ABI) - do not check if billed separately X- 1 5 Vital Signs [name] ([id number]) Has the patient been seen at the hospital within the last three years: Yes Total Score: 100 Level Of Care: New/Established - Level 3 Electronic Signature(s) Signed: 07/05/2019 5:08:08 PM By: [name], [name], RN, [name], [name], [name] Entered By: [name], [name], RN, [name], [name] on 07/05/2019 08:27:12 [name] ([id number]) -------------------------------------------------------------------------------- Encounter Discharge Information Details Patient Name: [name] Date of Service: 07/05/2019 8:15 AM Medical Record Number: [id number] Patient Account Number: [id number] Date of Birth/Sex: 1980-10-20 (39 y.o. M) Treating RN: [name] Primary Care Cyera Balboni: [name] Other Clinician: Referring Tyteanna Ost: [name] Treating Millisa Giarrusso/Extender: Melburn Hake, [name]: 134 Encounter Discharge Information  Items Discharge Condition: Stable Ambulatory Status: Ambulatory Discharge Destination: Home Transportation: Private Auto Accompanied By: self Schedule Follow-up Appointment: Yes Clinical Summary of Care: Electronic Signature(s) Signed: 07/05/2019 5:08:08 PM By: [name], [name], RN, [name], [name], [name] Entered By: [name], [name], RN, [name], [name] on 07/05/2019 08:29:05 [name] ([id number]) -------------------------------------------------------------------------------- Lower Extremity Assessment Details Patient Name: [name] Date of Service: 07/05/2019 8:15 AM Medical Record Number: [id number] Patient Account Number: [id number] Date of Birth/Sex: Jun 20, 1980 (39 y.o. M) Treating RN: Army Melia Primary Care Reality Dejonge: [name] Other Clinician: Referring Xylia Scherger: [name] Treating Sargent Mankey/Extender: Melburn Hake, [name]: 134 Edema Assessment Assessed: [Left: No] [Right: No] Edema: [Left: N] [Right: o] Vascular Assessment Pulses: Dorsalis Pedis Palpable: [Left:Yes] Electronic Signature(s) Signed: 07/05/2019 10:47:42 AM By: Army Melia Entered By: Army Melia on 07/05/2019 08:20:11 [name] ([id number]) -------------------------------------------------------------------------------- Multi Wound Chart Details Patient Name: [name] Date of Service: 07/05/2019 8:15 AM Medical Record Number: [id number] Patient Account Number: [id number] Date of Birth/Sex: Nov 04, 1980 (39 y.o. M) Treating RN: [name] Primary Care Clarita Mcelvain: [name] Other Clinician: Referring Roxene Alviar: [name] Treating Tanyon Alipio/Extender: Melburn Hake, [name]: 134 Vital Signs Height(in): 71 Pulse(bpm): 92 Weight(lbs): 338 Blood Pressure(mmHg): 131/78 Body Mass Index(BMI): 47 Temperature(F): 98.6 Respiratory Rate 16 (breaths/min): Photos: [N/A:N/A] Wound Location: Left Lower Leg - Lateral N/A N/A Wounding Event: Gradually Appeared N/A  N/A Primary Etiology: Pyoderma N/A N/A Comorbid History: Sleep Apnea, Hypertension, N/A N/A Colitis Date Acquired: 11/18/2015 N/A N/A Weeks of Treatment: 134 N/A N/A Wound Status: Open N/A N/A Measurements L x W x D 5.7x5x0.9 N/A N/A (cm) Area (cm) : 22.384 N/A N/A Volume (cm) : 20.145 N/A N/A % Reduction in Area: -356.00% N/A N/A % Reduction in Volume: -413.00% N/A N/A Classification: Full Thickness With Exposed N/A N/A Support Structures Exudate Amount: Medium N/A N/A Exudate Type: Serous N/A N/A Exudate Color: amber N/A N/A Wound Margin:  Distinct, outline attached N/A N/A Granulation Amount: Small (1-33%) N/A N/A Granulation Quality: Pink N/A N/A Necrotic Amount: Large (67-100%) N/A N/A Exposed Structures: Fat Layer (Subcutaneous N/A N/A Tissue) Exposed: Yes Muscle: Yes Fascia: No Tendon: No ASKIA, HAZELIP ([id number]) Joint: No Bone: No Epithelialization: None N/A N/A Treatment Notes Electronic Signature(s) Signed: 07/05/2019 5:08:08 PM By: [name], [name], RN, [name], [name], [name] Entered By: [name], [name], RN, [name], [name] on 07/05/2019 08:26:07 [name] ([id number]) -------------------------------------------------------------------------------- Multi-Disciplinary Care Plan Details Patient Name: [name] Date of Service: 07/05/2019 8:15 AM Medical Record Number: [id number] Patient Account Number: [id number] Date of Birth/Sex: [birth year] (39 y.o. M) Treating RN: [name] Primary Care [place]: [name] Other Clinician: Referring Shanece Cochrane: [name] Treating Carmin Alvidrez/Extender: Melburn Hake, [name]: 134 Active Inactive Orientation to the Wound Care Program Nursing Diagnoses: Knowledge deficit related to the wound healing center program Goals: Patient/caregiver will verbalize understanding of the [facility] Program Date Initiated: 12/11/2016 Target Resolution Date: 03/13/2017 Goal Status: Active Interventions: Provide education on  orientation to the wound center Notes: Venous Leg Ulcer Nursing Diagnoses: Knowledge deficit related to disease process and management Potential for venous Insuffiency (use before diagnosis confirmed) Goals: Non-invasive venous studies are completed as ordered Date Initiated: 12/11/2016 Target Resolution Date: 03/13/2017 Goal Status: Active Patient will maintain optimal edema control Date Initiated: 12/11/2016 Target Resolution Date: 03/13/2017 Goal Status: Active Patient/caregiver will verbalize understanding of disease process and disease management Date Initiated: 12/11/2016 Target Resolution Date: 03/13/2017 Goal Status: Active Verify adequate tissue perfusion prior to therapeutic compression application Date Initiated: 12/11/2016 Target Resolution Date: 03/13/2017 Goal Status: Active Interventions: Assess peripheral edema status every visit. Compression as ordered Provide education on venous insufficiency Treatment Activities: TASHA, JINDRA ([id number]) Therapeutic compression applied : 12/11/2016 Notes: Wound/Skin Impairment Nursing Diagnoses: Impaired tissue integrity Knowledge deficit related to ulceration/compromised skin integrity Goals: Patient/caregiver will verbalize understanding of skin care regimen Date Initiated: 12/11/2016 Target Resolution Date: 03/13/2017 Goal Status: Active Ulcer/skin breakdown will have a volume reduction of 30% by week 4 Date Initiated: 12/11/2016 Target Resolution Date: 03/13/2017 Goal Status: Active Ulcer/skin breakdown will have a volume reduction of 50% by week 8 Date Initiated: 12/11/2016 Target Resolution Date: 03/13/2017 Goal Status: Active Ulcer/skin breakdown will have a volume reduction of 80% by week 12 Date Initiated: 12/11/2016 Target Resolution Date: 03/13/2017 Goal Status: Active Ulcer/skin breakdown will heal within 14 weeks Date Initiated: 12/11/2016 Target Resolution Date: 03/13/2017 Goal Status:  Active Interventions: Assess patient/caregiver ability to obtain necessary supplies Assess patient/caregiver ability to perform ulcer/skin care regimen upon admission and as needed Assess ulceration(s) every visit Provide education on ulcer and skin care Treatment Activities: Skin care regimen initiated : 12/11/2016 Topical wound management initiated : 12/11/2016 Notes: Electronic Signature(s) Signed: 07/05/2019 5:08:08 PM By: [name], [name], RN, [name], [name], [name] Entered By: [name], [name], RN, [name], [name] on 07/05/2019 08:25:42 [name] ([id number]) -------------------------------------------------------------------------------- Pain Assessment Details Patient Name: [name] Date of Service: 07/05/2019 8:15 AM Medical Record Number: [id number] Patient Account Number: [id number] Date of Birth/Sex: Aug 18, 1980 (39 y.o. M) Treating RN: Army Melia Primary Care Shaylin Blatt: [name] Other Clinician: Referring Naitik Hermann: [name] Treating Daylin Eads/Extender: Melburn Hake, [name]: 134 Active Problems Location of Pain Severity and Description of Pain Patient Has Paino Yes Site Locations Pain Location: Pain in Ulcers Rate the pain. Current Pain Level: 2 Pain Management and Medication Current Pain Management: Electronic Signature(s) Signed: 07/05/2019 10:47:42 AM By: Army Melia Entered By: Army Melia on 07/05/2019 08:15:08 [place], [place]ard (  [id number]) -------------------------------------------------------------------------------- Patient/Caregiver Education Details Patient Name: [name] Date of Service: 07/05/2019 8:15 AM Medical Record Number: [id number] Patient Account Number: [id number] Date of Birth/Gender: 06/06/1980 (39 y.o. M) Treating RN: [name] Primary Care Physician: [name] Other Clinician: Referring Physician: [name] Treating Physician/Extender: Sharalyn Ink in Treatment: 134 Education Assessment Education  Provided To: Patient Education Topics Provided Venous: Handouts: Controlling Swelling with Multilayered Compression Wraps Methods: Demonstration, Explain/Verbal Responses: State content correctly Wound/Skin Impairment: Handouts: Caring for Your Ulcer Methods: Demonstration, Explain/Verbal Responses: State content correctly Electronic Signature(s) Signed: 07/05/2019 5:08:08 PM By: [name], [name], RN, [name], [name], [name] Entered By: [name], [name], RN, [name], [name] on 07/05/2019 08:27:38 [name] ([id number]) -------------------------------------------------------------------------------- Wound Assessment Details Patient Name: [name] Date of Service: 07/05/2019 8:15 AM Medical Record Number: [id number] Patient Account Number: [id number] Date of Birth/Sex: [birth year] (39 y.o. M) Treating RN: Army Melia Primary Care Paarth Cropper: [name] Other Clinician: Referring Cleotha Tsang: [name] Treating Kinza Gouveia/Extender: Melburn Hake, [name]: 134 Wound Status Wound Number: 1 Primary Etiology: Pyoderma Wound Location: Left Lower Leg - Lateral Wound Status: Open Wounding Event: Gradually Appeared Comorbid History: Sleep Apnea, Hypertension, Colitis Date Acquired: 11/18/2015 Weeks Of Treatment: 134 Clustered Wound: No Photos Wound Measurements Length: (cm) 5.7 % Reduction Width: (cm) 5 % Reduction Depth: (cm) 0.9 Epitheliali Area: (cm) 22.384 Tunneling: Volume: (cm) 20.145 Underminin in Area: -356% in Volume: -413% zation: None No g: No Wound Description Full Thickness With Exposed Support Foul Odor A Classification: Structures Slough/Fibr Wound Margin: Distinct, outline attached Exudate Medium Amount: Exudate Type: Serous Exudate Color: amber fter Cleansing: No ino Yes Wound Bed Granulation Amount: Small (1-33%) Exposed Structure Granulation Quality: Pink Fascia Exposed: No Necrotic Amount: Large (67-100%) Fat Layer (Subcutaneous Tissue) Exposed:  Yes Necrotic Quality: Adherent Slough Tendon Exposed: No Muscle Exposed: Yes Necrosis of Muscle: No Joint Exposed: No Bone Exposed: No [place], Chasin ([id number]) Treatment Notes Wound #1 (Left, Lateral Lower Leg) Notes TCA, dry gauze, BFD Electronic Signature(s) Signed: 07/05/2019 10:47:42 AM By: Army Melia Entered By: Army Melia on 07/05/2019 08:19:07 [name] ([id number]) -------------------------------------------------------------------------------- Vitals Details Patient Name: [name] Date of Service: 07/05/2019 8:15 AM Medical Record Number: [id number] Patient Account Number: [id number] Date of Birth/Sex: 1980/10/08 (39 y.o. M) Treating RN: Army Melia Primary Care Geoge Lawrance: [name] Other Clinician: Referring Calley Drenning: [name] Treating Kartik Fernando/Extender: Melburn Hake, [name]: 134 Vital Signs Time Taken: 08:15 Temperature (F): 98.6 Height (in): 71 Pulse (bpm): 92 Weight (lbs): 338 Respiratory Rate (breaths/min): 16 Body Mass Index (BMI): 47.1 Blood Pressure (mmHg): 131/78 Reference Range: 80 - 120 mg / dl Electronic Signature(s) Signed: 07/05/2019 10:47:42 AM By: Army Melia Entered By: Army Melia on 07/05/2019 08:15:39

## 2019-07-05 NOTE — Telephone Encounter (Signed)
COVID-19 Pre-Screening Questions:07/05/19  Do you currently have a fever (>100 F), chills or unexplained body aches? NO  Are you currently experiencing new cough, shortness of breath, sore throat, runny nose?NO .  Have you recently travelled outside the state of [place] in the last 14 days?NO .  Have you been in contact with someone that is currently pending confirmation of Covid19 testing or has been confirmed to have the Covid19 virus?  NO   **If the patient answers NO to ALL questions -  advise the patient to please call the clinic before coming to the office should any symptoms develop.     

## 2019-07-05 NOTE — Progress Notes (Signed)
[name] ([id number]) Visit Report for 07/05/2019 Chief Complaint Document Details Patient Name: [name], [name] Date of Service: 07/05/2019 8:15 AM Medical Record Number: [id number] Patient Account Number: [id number] Date of Birth/Sex: [birth year] (39 y.o. M) Treating RN: [name] Primary Care Provider: [name] Other Clinician: Referring Provider: [name] Treating Provider/Extender: Melburn Hake, Hayes Rehfeldt Weeks in Treatment: 134 Information Obtained from: Patient Chief Complaint He is here in follow up evaluation for LLE pyoderma ulcer Electronic Signature(s) Signed: 07/05/2019 4:43:11 PM By: Worthy Keeler PA-C Entered By: Worthy Keeler on 07/05/2019 08:15:35 [name] ([id number]) -------------------------------------------------------------------------------- HPI Details Patient Name: [name] Date of Service: 07/05/2019 8:15 AM Medical Record Number: [id number] Patient Account Number: [id number] Date of Birth/Sex: [birth year] (39 y.o. M) Treating RN: [name] Primary Care Provider: [name] Other Clinician: Referring Provider: [name] Treating Provider/Extender: Melburn Hake, Tomoya Ringwald Weeks in Treatment: 134 History of Present Illness HPI Description: 12/04/16; 39 year old man who comes into the clinic today for review of a wound on the posterior left calf. He tells me that is been there for about a year. He is not a diabetic he does smoke half a pack per day. He was seen in the ER on 11/20/16 felt to have cellulitis around the wound and was given clindamycin. An x-ray did not show osteomyelitis. The patient initially tells me that he has a milk allergy that sets off a pruritic itching rash on his lower legs which she scratches incessantly and he thinks that's what may have set up the wound. He has been using various topical antibiotics and ointments without any effect. He works in a [employer] and is on his feet all day. He does not have a prior  history of wounds however he does have the rash on both lower legs the right arm and the ventral aspect of his left arm. These are excoriations and clearly have had scratching however there are of macular looking areas on both legs including a substantial larger area on the right leg. This does not have an underlying open area. There is no blistering. The patient tells me that 2 years ago in [place] in response to the rash on his legs he saw a dermatologist who told him he had a condition which may be pyoderma gangrenosum although I may be putting words into his mouth. He seemed to recognize this. 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 He is not in any treatment for this. He's had no recent travel 12/11/16; the patient arrives today with his wound and roughly the same condition we've been using silver alginate this is a deep punched out wound with some surrounding erythema but no tenderness. Biopsy I did did not show confirmed pyoderma gangrenosum suggested nonspecific inflammation and vasculitis but does not provide an actual description of what was seen by the pathologist. I'm really not able to understand this We have also received information from the patient's dermatologist in [place] notes from April 2016. This was a doctor Agarwal- antal. 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 He was prescribed topical steroid high potency under occlusion which helped but at this point the patient did not have a deep punched out wound. 12/18/16; the patient's wound is larger in terms of surface area however this surface looks better and there is less depth. The surrounding erythema also is better. The patient states that the wrap we put on came off 2 days ago when he has been using  his compression stockings. He we are in the process of getting a dermatology consult. 12/26/16 on evaluation today patient's left lower extremity wound shows evidence of  infection with surrounding erythema noted. He has been tolerating the dressing changes but states that he has noted more discomfort. There is a larger area of erythema surrounding the wound. No fevers, chills, nausea, or vomiting noted at this time. With that being said the wound still does have slough covering the surface. He is not allergic to any medication that he is aware of at this point. In regard to his right lower extremity he had several regions that are erythematous and pruritic he wonders if there's anything we can do to help that. 01/02/17 I reviewed patient's wound culture which was obtained his visit last week. He was placed on doxycycline at that point. Unfortunately that does not appear to be an antibiotic that would likely help with the situation however the pseudomonas noted on culture is sensitive to Cipro. Also unfortunately patient's wound seems to have a large compared to last week's evaluation. Not severely so but there are definitely increased measurements in general. He is continuing to have discomfort as well he writes this to be a seven out of 10. In fact he would prefer me not to perform any debridement today due to the fact that he is having discomfort and considering he has an active infection on the little reluctant to do so anyway. No fevers, chills, nausea, or vomiting noted at this time. 01/08/17; patient seems dermatology on September 5. I suspect dermatology will want the slides from the biopsy I did sent to their pathologist. I'm not sure if there is a way we can expedite that. In any case the culture I did before I left on vacation 3 weeks ago showed Pseudomonas he was given 10 days of Cipro and per her description of her intake nurses is actually somewhat better this week although the wound is quite a bit bigger than I remember the last time I saw this. He still has 3 more days of Cipro [name] ([id number]) 01/21/17; dermatology appointment tomorrow. He has  completed the ciprofloxacin for Pseudomonas. Surface of the wound looks better however he is had some deterioration in the lesions on his right leg. Meantime the left lateral leg wound we will continue with sample 01/29/17; patient had his dermatology appointment but I can't yet see that note. He is completed his antibiotics. The wound is more superficial but considerably larger in circumferential area than when he came in. This is in his left lateral calf. He also has swollen erythematous areas with superficial wounds on the right leg and small papular areas on both arms. There apparently areas in her his upper thighs and buttocks I did not look at those. Dermatology biopsied the right leg. Hopefully will have their input next week. 02/05/17; patient went back to see his dermatologist who told him that he had a "scratching problem" as well as staph. He is now on a 30 day course of doxycycline and I believe she gave him triamcinolone cream to the right leg areas to help with the itching [not exactly sure but probably triamcinolone]. She apparently looked at the left lateral leg wound although this was not rebiopsied and I think felt to be ultimately part of the same pathogenesis. He is using sample border foam and changing nevus himself. He now has a new open area on the right posterior leg which was his biopsy site I don't  have any of the dermatology notes 02/12/17; we put the patient in compression last week with SANTYL to the wound on the left leg and the biopsy. Edema is much better and the depth of the wound is now at level of skin. Area is still the same oBiopsy site on the right lateral leg we've also been using santyl with a border foam dressing and he is changing this himself. 02/19/17; Using silver alginate started last week to both the substantial left leg wound and the biopsy site on the right wound. He is tolerating compression well. Has a an appointment with his primary M.D. tomorrow  wondering about diuretics although I'm wondering if the edema problem is actually lymphedema 02/26/17; the patient has been to see his primary doctor Dr. Jerrel Ivory at [facility] our primary care. She started him on Lasix 20 mg and this seems to have helped with the edema. However we are not making substantial change with the left lateral calf wound and inflammation. The biopsy site on the right leg also looks stable but not really all that different. 03/12/17; the patient has been to see vein and vascular Dr. Lucky Cowboy. He has had venous reflux studies I have not reviewed these. I did get a call from his dermatology office. 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 They therefore recommended clobetasol and tetracycline i.e. 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 03/26/17; apparently the patient just had reflux studies not an appointment with Dr. dew. She arrives in clinic today having applied clobetasol for 2-3 weeks. He notes over the last 2-3 days excessive drainage having to change the dressing 3-4 times a day and also expanding erythema. 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 Lillia Carmel at [facility] dermatology locally who kindly saw him at my request. 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 My concern was the degree of erythema medially extending from his left leg wound which was either inflammation from  pyoderma or cellulitis. I put him on Augmentin however culture of the wound showed Pseudomonas which is quinolone sensitive. I really don't believe he has cellulitis however in view of everything I will continue and give him a course of Cipro. He is also on doxycycline as an immune modulator for the pyoderma. In addition to his original wound on the left lateral leg with surrounding erythema he has a wound on the right posterior calf which was an original biopsy site done by dermatology. 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 Saw Dr. Lillia Carmel yesterday. He has been using topical antibiotics to both wound areas his original wound on the left and the biopsies/pathergy area on the right. There is definitely some improvement in the inflammation around the wound on the right although the patient states he has increasing sensitivity of the wounds. He is on prednisone 60 and doxycycline 1 as prescribed by Dr. Lillia Carmel. He is covering the topical  antibiotic with gauze and putting this in his own compression stocks and changing this daily. He states that Dr. Lottie Rater did a culture of the left leg wound yesterday 05/07/17; pyoderma gangrenosum. The patient saw Dr. Lillia Carmel yesterday and has a follow-up with her in one month. He is still using topical antibiotics to both wounds although he can't recall exactly what type. He is still on prednisone 60 mg. Dr. Lillia Carmel stated that the doxycycline could stop if we were in agreement. 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 The right medial leg was induced by biopsy/pathergy. The area on the right is essentially healed. 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 The right medial leg has closed and  remains closed. He is still on prednisone 60. oHe tells me he missed his last dermatology appointment with Dr. Lillia Carmel but will make another appointment. He reports that her blood sugar at a recent screen in [place] was high 200's. He was 180 today. He is more cushingoid blood pressure is Steury, Cedar ([id number]) up a bit. I think he is going to require still much longer prednisone perhaps another 3 months before attempting to taper. In the meantime his wound is a lot better. Smaller. He is cleaning this off daily and applying topical antibiotics. When he was last in the clinic I thought about changing to [facility] and actually put in a couple of calls to dermatology although probably not during their business hours. 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 He continues on oral prednisone. He has been using triple antibiotic ointment. There is surface debris and we will transition to [facility] and have him return in 2 weeks. He has lost 30 pounds since his last appointment with lifestyle modification. He may benefit from topical steroid cream for treatment this can be considered at a later date. 08/22/17 on evaluation today patient appears to actually be doing rather well in regard to his left lateral lower extremity ulcer. He has actually been managed by Dr. Dellia Nims most recently. Patient is currently on oral steroids at this time. This seems to have been of benefit for him. Nonetheless his last visit was actually with Leah on 08/06/17. Currently he is not utilizing any topical steroid creams although this could be of benefit as well. No fevers, chills, nausea, or vomiting noted at this time. 09/05/17 on evaluation today patient appears to be doing better in regard to his left lateral lower extremity ulcer. He has been tolerating the dressing changes without complication.  He is using Santyl with good effect. Overall I'm very pleased with how things are standing at this point. Patient likewise is happy that this is doing better. 09/19/17 on evaluation today patient actually appears to be doing rather well in regard to his left lateral lower extremity ulcer. 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 He's not having any significant pain. 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 He has been tolerating the Santyl without complication and in general I feel like he's making good  progress. 10/17/17 on evaluation today patient appears to be doing very well in regard to his left lateral lower surety ulcer. He has been tolerating the dressing changes without complication. There does not appear to be any evidence of infection he's alternating the Santyl and the triple antibiotic ointment every other day this seems to be doing well for him. 11/03/17 on evaluation today patient appears to be doing very well in regard to his left lateral lower extremity ulcer. He is been tolerating the dressing changes without complication which is good news. Fortunately there does not appear to be any evidence of infection which is also great news. Overall is doing excellent they are starting to taper down on the prednisone is down to 40 mg at this point it also started topical clobetasol for him. 11/17/17 on evaluation today patient appears to be doing well in regard to his left lateral lower surety ulcer. He's been tolerating the dressing changes without complication. He does note that he is having no pain, no excessive drainage or discharge, and overall he feels like things are going about how he would expect and hope they would. Overall he seems to have no evidence of infection at this time in my opinion which is  good news. 12/04/17-He is seen in follow-up evaluation for right lateral lower extremity ulcer. He has been applying topical steroid cream. Today's measurement show slight increase in size. Over the next 2 weeks we will transition to every other day Santyl and steroid cream. He has been encouraged to monitor for changes and notify clinic with any concerns 12/15/17 on evaluation today patient's left lateral motion the ulcer and fortunately is doing worse again at this point. This just since last week to this week has close to doubled in size according to the patient. I did not seeing last week's I do not have a visual to compare this to in our system was also down so we do not have all the charts and at this point. Nonetheless it does have me somewhat concerned in regard to the fact that again he was worried enough about it he has contact the dermatology that placed them back on the full strength, 50 mg a day of the prednisone that he was taken previous. He continues to alternate using clobetasol along with Santyl at this point. He is obviously somewhat frustrated. 12/22/17 on evaluation today patient appears to be doing a little worse compared to last evaluation. Unfortunately the wound is a little deeper and slightly larger than the last week's evaluation. With that being said he has made some progress in regard to the irritation surrounding at this time unfortunately despite that progress that's been made he still has a significant issue going on here. 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 For that reason I discussed with him today the possibility of perform a wound culture to ensure there's nothing overtly infected. 01/06/18 on evaluation today patient's wound is larger and deeper than previously evaluated. With that being said it did appear that  his wound was infected after my last evaluation with him. Subsequently I did end up prescribing a prescription for Bactrim DS which she has been taking and having no complication with. Fortunately there does not appear to be any evidence of [name], [name] ([id number]) infection at this point in time as far as anything spreading, no want to  touch, and overall I feel like things are showing signs of improvement. 01/13/18 on evaluation today patient appears to be even a little larger and deeper than last time. There still muscle exposed in the base of the wound. Nonetheless he does appear to be less erythematous I do believe inflammation is calming down also believe the infection looks like it's probably resolved at this time based on what I'm seeing. No fevers, chills, nausea, or vomiting noted at this time. 01/30/18 on evaluation today patient actually appears to visually look better for the most part. Unfortunately those visually this looks better he does seem to potentially have what may be an abscess in the muscle that has been noted in the central portion of the wound. This is the first time that I have noted what appears to be fluctuance in the central portion of the muscle. With that being said I'm somewhat more concerned about the fact that this might indicate an abscess formation at this location. I do believe that an ultrasound would be appropriate. This is likely something we need to try to do as soon as possible. He has been switch to mupirocin ointment and he is no longer using the steroid ointment as prescribed by dermatology he sees them again next week he's been decreased from 60 to 40 mg of prednisone. 03/09/18 on evaluation today patient actually appears to be doing a little better compared to last time I saw him. There's not as much erythema surrounding the wound itself. He I did review his most recent infectious disease note which was dated 02/24/18. He saw Dr. Michel Bickers in  [place]. 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 Patient is now off of antibiotics and they are continually observing this. There seems to be no change in the past two weeks in my pinion based on what the patient says and what I see today compared to what Dr. Megan Salon likely saw two weeks ago. No fevers, chills, nausea, or vomiting noted at this time. 03/23/18 on evaluation today patient's wound actually appears to be showing signs of improvement which is good news. He is currently still on the Dapsone. He is also working on tapering the prednisone to get off of this and Dr. Lottie Rater is working with him in this regard. Nonetheless overall I feel like the wound is doing well it does appear based on the infectious disease note that I reviewed from Dr. 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 I did also review the results of his ultrasound of left lower extremity which revealed there was a dentist tissue in the base of the wound without an abscess noted. 04/06/18 on evaluation today the patient's left lateral lower extremity ulcer actually appears to be doing fairly well which is excellent news. There does not appear to be any evidence of infection at this time which is also great news. Overall he still does have a significantly large ulceration although little by little he seems to be making progress. He is down to 10 mg a day of the prednisone. 04/20/18 on evaluation today patient actually appears to be doing excellent at this time in regard to his left lower extremity ulcer. He's making signs of good progress unfortunately this is taking much longer than we would really like  to see but nonetheless he is making progress. Fortunately there does not appear to be any evidence of infection at this  time. No fevers, chills, nausea, or vomiting noted at this time. The patient has not been using the Santyl due to the cost he hadn't got in this field yet. He's mainly been using the antibiotic ointment topically. Subsequently he also tells me that he really has not been scrubbing in the shower I think this would be helpful again as I told him it doesn't have to be anything too aggressive to even make it believe just enough to keep it free of some of the loose slough and biofilm on the wound surface. 05/11/18 on evaluation today patient's wound appears to be making slow but sure progress in regard to the left lateral lower extremity ulcer. He is been tolerating the dressing changes without complication. Fortunately there does not appear to be any evidence of infection at this time. He is still just using triple antibiotic ointment along with clobetasol occasionally over the area. He never got the Santyl and really does not seem to intend to in my pinion. 06/01/18 on evaluation today patient appears to be doing a little better in regard to his left lateral lower extremity ulcer. He states that overall he does not feel like he is doing as well with the Dapsone as he did with the prednisone. Nonetheless he sees his dermatologist later today and is gonna talk to them about the possibility of going back on the prednisone. Overall again I believe that the wound would be better if you would utilize Santyl but he really does not seem to be interested in going back to the [place] at this point. He has been using triple antibiotic ointment. 06/15/18 on evaluation today patient's wound actually appears to be doing about the same at this point. Fortunately there is no signs of infection at this time. He has made slight improvements although he continues to not really want to clean the wound bed at this point. He states that he just doesn't mess with it he doesn't want to cause any problems with everything else  he has going on. He has been on medication, antibiotics as prescribed by his dermatologist, for a staff infection of his lower extremities which is really drying out now and looking much better he tells me. Fortunately there is no sign of overall infection. [name], [name] ([id number]) 06/29/18 on evaluation today patient appears to be doing well in regard to his left lateral lower surety ulcer all things considering. Fortunately his staff infection seems to be greatly improved compared to previous. He has no signs of infection and this is drying up quite nicely. He is still the doxycycline for this is no longer on cental, Dapsone, or any of the other medications. His dermatologist has recommended possibility of an infusion but right now he does not want to proceed with that. 07/13/18 on evaluation today patient appears to be doing about the same in regard to his left lateral lower surety ulcer. Fortunately there's no signs of infection at this time which is great news. Unfortunately he still builds up a significant amount of Slough/biofilm of the surface of the wound he still is not really cleaning this as he should be appropriately. Again I'm able to easily with saline and gauze remove the majority of this on the surface which if you would do this at home would likely be a dramatic improvement for him as far as getting the area  to improve. Nonetheless overall I still feel like he is making progress is just very slow. I think Santyl will be of benefit for him as well. Still he has not gotten this as of this point. 07/27/18 on evaluation today patient actually appears to be doing little worse in regards of the erythema around the periwound region of the wound he also tells me that he's been having more drainage currently compared to what he was experiencing last time I saw him. He states not quite as bad as what he had because this was infected previously but nonetheless is still appears to be doing  poorly. Fortunately there is no evidence of systemic infection at this point. The patient tells me that he is not going to be able to afford the Santyl. He is still waiting to hear about the infusion therapy with his dermatologist. Apparently she wants an updated colonoscopy first. 08/10/18 on evaluation today patient appears to be doing better in regard to his left lateral lower extremity ulcer. Fortunately he is showing signs of improvement in this regard he's actually been approved for Remicade infusion's as well although this has not been scheduled as of yet. Fortunately there's no signs of active infection at this time in regard to the wound although he is having some issues with infection of the right lower extremity is been seen as dermatologist for this. Fortunately they are definitely still working with him trying to keep things under control. 09/07/18 on evaluation today patient is actually doing rather well in regard to his left lateral lower extremity ulcer. He notes these actually having some hair grow back on his extremity which is something he has not seen in years. He also tells me that the pain is really not giving them any trouble at this time which is also good news overall she is very pleased with the progress he's using a combination of the mupirocin along with the probate is all mixed. 09/21/18 on evaluation today patient actually appears to be doing fairly well all things considered in regard to his looks from the ulcer. He's been tolerating the dressing changes without complication. Fortunately there's no signs of active infection at this time which is good news he is still on all antibiotics or prevention of the staff infection. He has been on prednisone for time although he states it is gonna contact his dermatologist and see if she put them on a short course due to some irritation that he has going on currently. Fortunately there's no evidence of any overall worsening this is  going very slow I think cental would be something that would be helpful for him although he states that $50 for tube is quite expensive. He therefore is not willing to get that at this point. 10/06/18 on evaluation today patient actually appears to be doing decently well in regard to his left lateral leg ulcer. He's been tolerating the dressing changes without complication. Fortunately there's no signs of active infection at this time. Overall I'm actually rather pleased with the progress he's making although it's slow he doesn't show any signs of infection and he does seem to be making some improvement. I do believe that he may need a switch up and dressings to try to help this to heal more appropriately and quickly. 10/19/18 on evaluation today patient actually appears to be doing better in regard to his left lateral lower extremity ulcer. This is shown signs of having much less Slough buildup at this point due to  the fact he has been using the Santyl. Obviously this is very good news. The overall size of the wound is not dramatically smaller but again the appearance is. 11/02/18 on evaluation today patient actually appears to be doing quite well in regard to his lower [place] ulcer. A lot of the skin around the ulcer is actually somewhat irritating at this point this seems to be more due to the dressing causing irritation from the adhesive that anything else. Fortunately there is no signs of active infection at this time. 11/24/18 on evaluation today patient appears to be doing a little worse in regard to his overall appearance of his lower extremity ulcer. There's more erythema and warmth around the wound unfortunately. He is currently on doxycycline which he has been on for some time. With that being said I'm not sure that seems to be helping with what appears to possibly be an acute cellulitis with regard to his left lower extremity ulcer. No fevers, chills, nausea, or vomiting noted at this  time. 12/08/18 on evaluation today patient's wounds actually appears to be doing significantly better compared to his last evaluation. He has been using Santyl along with alternating tripling about appointment as well as the steroid cream seems to be doing [name], [name] ([id number]) quite well and the wound is showing signs of improvement which is excellent news. Fortunately there's no evidence of infection and in fact his culture came back negative with only normal skin flora noted. 12/21/2018 upon evaluation today patient actually appears to be doing excellent with regard to his ulcer. This is actually the best that I have seen it since have been helping to take care of him. It is both smaller as well as less slough noted on the surface of the wound and seems to be showing signs of good improvement with new skin growing from the edges. He has been using just the triamcinolone he does wonder if he can get a refill of that ointment today. 01/04/2019 upon evaluation today patient actually appears to be doing well with regard to his left lateral lower extremity ulcer. With that being said it does not appear to be that he is doing quite as well as last time as far as progression is concerned. There does not appear to be any signs of infection or significant irritation which is good news. With that being said I do believe that he may benefit from switching to a collagen based dressing based on how clean The wound appears. 01/18/2019 on evaluation today patient actually appears to be doing well with regard to his wound on the left lower extremity. He is not made a lot of progress compared to where we were previous but nonetheless does seem to be doing okay at this time which is good news. There is no signs of active infection which is also good news. My only concern currently is I do wish we can get him into utilizing the collagen dressing his insurance would not pay for the supplies that we ordered although  it appears that he may be able to order this through his supply company that he typically utilizes. This is [employer]. Nonetheless he did try to order it during the office visit today and it appears this did go through. We will see if he can get that it is a different brand but nonetheless he has collagen and I do think will be beneficial. 02/01/2019 on evaluation today patient actually appears to be doing a little worse today in regard  to the overall size of his wounds. Fortunately there is no signs of active infection at this time. That is visually. Nonetheless when this is happened before it was due to infection. For that reason were somewhat concerned about that this time as well. 02/08/2019 on evaluation today patient unfortunately appears to be doing slightly worse with regard to his wound upon evaluation today. Is measuring a little deeper and a little larger unfortunately. I am not really sure exactly what is causing this to enlarge he actually did see his dermatologist she is going to see about initiating Humira for him. Subsequently she also did do steroid injections into the wound itself in the periphery. Nonetheless still nonetheless he seems to be getting a little bit larger he is gone back to just using the steroid cream topically which I think is appropriate. I would say hold off on the collagen for the time being is definitely a good thing to do. Based on the culture results which we finally did get the final result back regarding it shows staph as the bacteria noted again that can be a normal skin bacteria based on the fact however he is having increased drainage and worsening of the wound measurement wise I would go ahead and place him on an antibiotic today I do believe for this. 02/15/2019 on evaluation today patient actually appears to be doing somewhat better in regard to his ulcer. There is no signs of worsening at this time I did review his culture results which showed evidence  of Staphylococcus aureus but not MRSA. Again this could just be more related to the normal skin bacteria although he states the drainage has slowed down quite a bit he may have had a mild infection not just colonization. And was much smaller and then since around10/04/2019 on evaluation today patient appears to be doing unfortunately worse as far as the size of the wound. I really feel like that this is steadily getting larger again it had been doing excellent right at the beginning of September we have seen a steady increase in the area of the wound it is almost 2-1/2 times the size it was on September 1. Obviously this is a bad trend this is not wanting to see. For that reason we went back to using just the topical triamcinolone cream which does seem to help with inflammation. I checked him for bacteria by way of culture and nothing showed positive there. I am considering giving him a short course of a tapering steroid Dosepak today to see if that is can be beneficial for him. The patient is in agreement with giving that a try. 03/08/2019 on evaluation today patient appears to be doing very well in comparison to last evaluation with regard to his lower extremity ulcer. This is showing signs of less inflammation and actually measuring slightly smaller compared to last time every other week over the past month and a half he has been measuring larger larger larger. Nonetheless I do believe that the issue has been inflammation the prednisone does seem to have been beneficial for him which is good news. No fevers, chills, nausea, vomiting, or diarrhea. 03/22/2019 on evaluation today patient appears to be doing about the same with regard to his leg ulcer. He has been tolerating the dressing changes without complication. With that being said the wound seems to be mostly arrested at its current size but really is not making any progress except for when we prescribed the prednisone. He did show some  signs  of dropping as far as the overall size of the wound during that interval week. Nonetheless this is something he is not on long-term at this point and unfortunately I think he is getting need either this or else the Humira which his dermatologist has discussed try to get approval for. With that being said he will be seeing his dermatologist on the 11th of this month that is November. TERIK, HAUGHEY ([id number]) 04/19/2019 on evaluation today patient appears to be doing really about the same the wound is measuring slightly larger compared to last time I saw him. He has not been into the office since November 2 due to the fact that he unfortunately had Covid as that his entire family. He tells me that it was rough but they did pull-through and he seems to be doing much better. Fortunately there is no signs of active infection at this time. No fevers, chills, nausea, vomiting, or diarrhea. 05/10/2019 on evaluation today patient unfortunately appears to be doing significantly worse as compared to last time I saw him. He does tell me that he has had his first dose of Humira and actually is scheduled to get the next one in the upcoming week. With that being said he tells me also that in the past several days he has been having a lot of issues with green drainage she showed me a picture this is more blue-green in color. He is also been having issues with increased sloughy buildup and the wound does appear to be larger today. Obviously this is not the direction that we want everything to take based on the starting of his Humira. Nonetheless I think this is definitely a result of likely infection and to be honest I think this is probably Pseudomonas causing the infection based on what I am seeing. 05/24/2019 on evaluation today patient unfortunately appears to be doing significantly worse compared to his prior evaluation with me 2 weeks ago. I did review his culture results which showed that he does have Staph  aureus as well as Pseudomonas noted on the culture. Nonetheless the Levaquin that I prescribed for him does not appear to have been appropriate and in fact he tells me he is no longer experiencing the green drainage and discharge that he had at the last visit. Fortunately there is no signs of active infection at this time which is good news although the wound has significantly worsened it in fact is much deeper than it was previous. We have been utilizing up to this point triamcinolone ointment as the prescription topical of choice but at this time I really feel like that the wound is getting need to be packed in order to appropriately manage this due to the deeper nature of the wound. Therefore something along the lines of an alginate dressing may be more appropriate. 05/31/2019 upon inspection today patient's wound actually showed signs of doing poorly at this point. Unfortunately he just does not seem to be making any good progress despite what we have tried. He actually did go ahead and pick up the Cipro and start taking that as he was noticing more green drainage he had previously completed the Levaquin that I prescribed for him as well. Nonetheless he missed his appointment for the seventh last week on Wednesday with the wound care center and [facility] where his dermatologist referred him. Obviously I do think a second opinion would be helpful at this point especially in light of the fact that the patient  seems to be doing so poorly despite the fact that we have tried everything that I really know how at this point. The only thing that ever seems to have helped him in the past is when he was on high doses of continual steroids that did seem to make a difference for him. 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 06/07/2019 upon  evaluation today patient unfortunately appears to be doing worse yet again with regard to his wound. In fact I am starting to question whether or not he may have a fluid pocket in the muscle at this point based on the bulging and the soft appearance to the central portion of the muscle area. There is not anything draining from the muscle itself at this time which is good news but nonetheless the wound is expanding. I am not really seeing any results of the Humira as far as overall wound progression based on what I am seeing at this point. The patient has been referred for second opinion with regard to his wound to the [facility] wound care center by his dermatologist which I definitely am not in opposition to. Unfortunately we tried multiple dressings in the past including collagen, alginate, and at one point even Hydrofera Blue. With that being said he is never really used it for any significant amount of time due to the fact that he often complains of pain associated with these dressings and then will go back to either using the Santyl which she has done intermittently or more frequently the triamcinolone. He is also using his own compression stockings. We have wrapped him in the past but again that was something else that he really was not a big fan of. 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 He seemed to do much better when he was on the steroids although honestly I understand that the steroids are not necessarily the best medication to be on long-term obviously 06/14/2019 on evaluation today patient appears to be doing actually a little bit better with regard to the overall  appearance with his leg. Unfortunately he does continue to have issues with what appears to be some fluid underneath the muscle although he did see the wound specialty center at [facility] last week their main goals were to see about infusion therapy in place of the Humira as they feel like that is not quite strong enough. They also recommended that we continue with the treatment otherwise as we are they felt like that was appropriate and they are okay with him continuing to follow-up here with [place] in that regard. With that being said they are also sending him to the vein specialist there to see about vein stripping and if that would be of benefit for him. Subsequently they also did not really address whether or not an ultrasound of the muscle area to see if there is anything that needs to be addressed here would be appropriate or not. For that reason I  discussed this with him last week I think we may proceed down that road at this point. MOXON, MESSLER ([id number]) 06/21/2019 upon evaluation today patient's wound actually appears to be doing slightly better compared to previous evaluations. I do believe that he has made a difference with regard to the progression here with the use of oral steroids. Again in the past has been the only thing that is really calm things down. He does tell me that from [name] is gotten a good news from there that there are no further vein stripping that is necessary at this point. I do not have that available for review today although the patient did relay this to me. He also did obtain and have the ultrasound of the wound completed which I did sign off on today. It does appear that there is no fluid collection under the muscle this is likely then just edematous tissue in general. That is also good news. Overall I still believe the inflammation is the main issue here. He did inquire about the possibility of a wound VAC again with the muscle protruding like it is I am not really sure  whether the wound VAC is necessarily ideal or not. That is something we will have to consider although I do believe he may need compression wrapping to try to help with edema control which could potentially be of benefit. 06/28/2019 on evaluation today patient appears to be doing slightly better measurement wise although this is not terribly smaller he least seems to be trending towards that direction. With that being said he still seems to have purulent drainage noted in the wound bed at this time. He has been on Levaquin followed by Cipro over the past month. Unfortunately he still seems to have some issues with active infection at this time. I did perform a culture last week in order to evaluate and see if indeed there was still anything going on. Subsequently the culture did come back showing Pseudomonas which is consistent with the drainage has been having which is blue-green in color. He also has had an odor that again was somewhat consistent with Pseudomonas as well. Long story short it appears that the culture showed an intermediate finding with regard to how well the Cipro will work for the Pseudomonas infection. Subsequently being that he does not seem to be clearing up and at best what we are doing is just keeping this at [place] I think he may need to see infectious disease to discuss IV antibiotic options. 07/05/2019 upon evaluation today patient appears to be doing okay in regard to his leg ulcer. He has been tolerating the dressing changes at this point without complication. Fortunately there is no signs of active infection at this time which is good news. No fevers, chills, nausea, vomiting, or diarrhea. With that being said he does have an appointment with infectious disease tomorrow and his primary care on Wednesday. Again the reason for the infectious disease referral was due to the fact that he did not seem to be fully resolving with the use of oral antibiotics and therefore we were  thinking that IV antibiotic therapy may be necessary secondary to the fact that there was an intermediate finding for how effective the Cipro may be. Nonetheless again he has been having a lot of purulent and even green drainage. Fortunately right now that seems to have calmed down over the past week with the reinitiation of the oral antibiotic. Nonetheless we will see what Dr. Megan Salon has to  say. Electronic Signature(s) Signed: 07/05/2019 4:21:32 PM By: Worthy Keeler PA-C Entered By: Worthy Keeler on 07/05/2019 16:21:32 [name], [name] ([id number]) -------------------------------------------------------------------------------- Physical Exam Details Patient Name: [name] Date of Service: 07/05/2019 8:15 AM Medical Record Number: [id number] Patient Account Number: [id number] Date of Birth/Sex: [birth year] (39 y.o. M) Treating RN: [name] Primary Care Provider: [name] Other Clinician: Referring Provider: [name] Treating Provider/Extender: Melburn Hake, Zaahir Pickney Weeks in Treatment: 134 Constitutional Obese and well-hydrated in no acute distress. Respiratory normal breathing without difficulty. Psychiatric this patient is able to make decisions and demonstrates good insight into disease process. Alert and Oriented x 3. pleasant and cooperative. Notes With regard to patient's wound he still has a significant muscle in the central portion of the wound which is exposed and somewhat bulging outward with that being said I do see evidence of this being better than it was previous still were not really where we want to be. No sharp debridement was required today. Electronic Signature(s) Signed: 07/05/2019 4:21:59 PM By: Worthy Keeler PA-C Entered By: Worthy Keeler on 07/05/2019 16:21:58 [name], [name] ([id number]) -------------------------------------------------------------------------------- Physician Orders Details Patient Name: [name] Date of Service:  07/05/2019 8:15 AM Medical Record Number: [id number] Patient Account Number: [id number] Date of Birth/Sex: 01/20/1981 (39 y.o. 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 Anesthetic (add to Medication List) Wound #1 Left,Lateral Lower Leg o Topical Lidocaine 4% cream applied to wound bed prior to debridement (In Clinic Only). Skin Barriers/Peri-Wound Care Wound #1 Left,Lateral Lower Leg o Triamcinolone Acetonide Ointment (TCA) Primary Wound Dressing o Dry Gauze Secondary Dressing Wound #1 Left,Lateral Lower Leg o Boardered Foam Dressing Dressing Change Frequency Wound #1 Left,Lateral Lower Leg o Change dressing every other day. Follow-up Appointments Wound #1 Left,Lateral Lower Leg o Return Appointment in 1 week. Edema Control Wound #1 Left,Lateral Lower Leg o Elevate legs to the level of the heart and pump ankles as often as possible Medications-please add to medication list. Wound #1 Left,Lateral Lower Leg o P.O. Antibiotics - Continue antibiotics BABY, STAIRS ([id number]) Electronic Signature(s) Signed: 07/05/2019 4:43:11 PM By: Worthy Keeler PA-C Signed: 07/05/2019 5:08:08 PM By: [name] [name], RN, [name], [name], [name] Entered By: [name], [name], RN, [name], [name] on 07/05/2019 08:29:46 [name], [name] ([id number]) -------------------------------------------------------------------------------- Problem List Details Patient Name: [name] Date of Service: 07/05/2019 8:15 AM Medical  Record Number: [id number] Patient Account Number: [id number] Date of Birth/Sex: [birth year] (39 y.o. 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 No Yes I87.2 Venous insufficiency (chronic) (peripheral) 12/04/2016 No Yes L03.116 Cellulitis of left lower limb 05/24/2019 No Yes Inactive Problems ICD-10 Code Description Active Date Inactive Date L97.213 Non-pressure chronic ulcer of right calf with necrosis of muscle 04/02/2017 04/02/2017 Resolved Problems Electronic Signature(s) Signed: 07/05/2019 4:43:11 PM By: Worthy Keeler PA-C Entered By: Worthy Keeler on 07/05/2019 08:15:21 Corbello,  HIROKI ([id number]) -------------------------------------------------------------------------------- Progress Note Details Patient Name: WYLIE, COON Date of Service: 07/05/2019 8:15 AM Medical Record Number: [id number] Patient Account Number: [id number] Date of Birth/Sex: November 11, 1980 (39 y.o. M) Treating RN: [name] Primary Care Provider: [name] Other Clinician: Referring Provider: [name] Treating Provider/Extender: Melburn Hake, Alizee Maple Weeks in Treatment: 134 Subjective Chief Complaint Information obtained from Patient He is here in follow up evaluation for LLE pyoderma ulcer History of Present Illness (HPI) 12/04/16; 39 year old man who comes into the clinic today for review of a wound on the posterior left calf. He tells me that is been there for about a year. He is not a diabetic he does smoke half a pack per day. He was seen in the ER on 11/20/16 felt to have cellulitis around the wound and was given clindamycin. An x-ray did not show  osteomyelitis. The patient initially tells me that he has a milk allergy that sets off a pruritic itching rash on his lower legs which she scratches incessantly and he thinks that's what may have set up the wound. He has been using various topical antibiotics and ointments without any effect. He works in a [employer] and is on his feet all day. He does not have a prior history of wounds however he does have the rash on both lower legs the right arm and the ventral aspect of his left arm. These are excoriations and clearly have had scratching however there are of macular looking areas on both legs including a substantial larger area on the right leg. This does not have an underlying open area. There is no blistering. The patient tells me that 2 years ago in [place] in response to the rash on his legs he saw a dermatologist who told him he had a condition which may be pyoderma gangrenosum although I may be putting words into his mouth. He seemed to recognize this. 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 He is not in any treatment for this. He's had no recent travel 12/11/16; the patient arrives today with his wound and roughly the same condition we've been using silver alginate this is a deep punched out wound with some surrounding erythema but no tenderness. Biopsy I did did not show confirmed pyoderma gangrenosum suggested nonspecific inflammation and vasculitis but does not provide an actual description of what was seen by the pathologist. I'm really not able to understand this We have also received information from the patient's dermatologist in [place] notes from April 2016. This was a doctor Agarwal- antal. 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 He was prescribed topical steroid high potency under occlusion which helped but at this point the patient did not have a deep punched out wound. 12/18/16; the patient's wound is larger in terms  of surface area however this surface looks better and there is less depth. The surrounding erythema also is better. The patient states that the wrap we put on came off 2 days ago when he has been using his compression stockings. He we are in the process of getting a dermatology consult. 12/26/16 on evaluation today patient's left lower extremity wound shows evidence of infection with surrounding erythema noted. He has been tolerating the dressing changes but states that he has noted more discomfort. There is a larger area of erythema surrounding the wound. No fevers, chills, nausea, or vomiting noted at this time. With that being said the wound still does have slough covering the  surface. He is not allergic to any medication that he is aware of at this point. In regard to his right lower extremity he had several regions that are erythematous and pruritic he wonders if there's anything we can do to help that. 01/02/17 I reviewed patient's wound culture which was obtained his visit last week. He was placed on doxycycline at that point. Unfortunately that does not appear to be an antibiotic that would likely help with the situation however the pseudomonas noted on culture is sensitive to Cipro. Also unfortunately patient's wound seems to have a large compared to last week's evaluation. Not severely so but there are definitely increased measurements in general. He is continuing to have discomfort as well he writes this to be a seven out of 10. In fact he would prefer me not to perform any debridement today due to the fact that he is having discomfort and considering he has an active infection on the little reluctant to do so anyway. No fevers, [name], [name] ([id number]) chills, nausea, or vomiting noted at this time. 01/08/17; patient seems dermatology on September 5. I suspect dermatology will want the slides from the biopsy I did sent to their pathologist. I'm not sure if there is a way we can expedite  that. In any case the culture I did before I left on vacation 3 weeks ago showed Pseudomonas he was given 10 days of Cipro and per her description of her intake nurses is actually somewhat better this week although the wound is quite a bit bigger than I remember the last time I saw this. He still has 3 more days of Cipro 01/21/17; dermatology appointment tomorrow. He has completed the ciprofloxacin for Pseudomonas. Surface of the wound looks better however he is had some deterioration in the lesions on his right leg. Meantime the left lateral leg wound we will continue with sample 01/29/17; patient had his dermatology appointment but I can't yet see that note. He is completed his antibiotics. The wound is more superficial but considerably larger in circumferential area than when he came in. This is in his left lateral calf. He also has swollen erythematous areas with superficial wounds on the right leg and small papular areas on both arms. There apparently areas in her his upper thighs and buttocks I did not look at those. Dermatology biopsied the right leg. Hopefully will have their input next week. 02/05/17; patient went back to see his dermatologist who told him that he had a "scratching problem" as well as staph. He is now on a 30 day course of doxycycline and I believe she gave him triamcinolone cream to the right leg areas to help with the itching [not exactly sure but probably triamcinolone]. She apparently looked at the left lateral leg wound although this was not rebiopsied and I think felt to be ultimately part of the same pathogenesis. He is using sample border foam and changing nevus himself. He now has a new open area on the right posterior leg which was his biopsy site I don't have any of the dermatology notes 02/12/17; we put the patient in compression last week with SANTYL to the wound on the left leg and the biopsy. Edema is much better and the depth of the wound is now at level of  skin. Area is still the same Biopsy site on the right lateral leg we've also been using santyl with a border foam dressing and he is changing this himself. 02/19/17; Using silver alginate started last week  to both the substantial left leg wound and the biopsy site on the right wound. He is tolerating compression well. Has a an appointment with his primary M.D. tomorrow wondering about diuretics although I'm wondering if the edema problem is actually lymphedema 02/26/17; the patient has been to see his primary doctor Dr. Jerrel Ivory at [facility] our primary care. She started him on Lasix 20 mg and this seems to have helped with the edema. However we are not making substantial change with the left lateral calf wound and inflammation. The biopsy site on the right leg also looks stable but not really all that different. 03/12/17; the patient has been to see vein and vascular Dr. Lucky Cowboy. He has had venous reflux studies I have not reviewed these. I did get a call from his dermatology office. 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 They therefore recommended clobetasol and tetracycline i.e. 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 03/26/17; apparently the patient just had reflux studies not an appointment with Dr. dew. She arrives in clinic today having applied clobetasol for 2-3 weeks. He notes over the last 2-3 days excessive drainage having to change the dressing 3-4 times a day and also expanding erythema. He states the expanding erythema seems to come and go and was last this red was earlier in the month.he is on doxycycline 150 mg twice a day as an anti-inflammatory systemic therapy for possible pyoderma gangrenosum along  with the topical clobetasol 04/02/17; the patient was seen last week by Dr. Lillia Carmel at [facility] dermatology locally who kindly saw him at my request. 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 My concern was the degree of erythema medially extending from his left leg wound which was either inflammation from pyoderma or cellulitis. I put him on Augmentin however culture of the wound showed Pseudomonas which is quinolone sensitive. I really don't believe he has cellulitis however in view of everything I will continue and give him a course of Cipro. He is also on doxycycline as an immune modulator for the pyoderma. In addition to his original wound on the left lateral leg with surrounding erythema he has a wound on the right posterior calf which was an original biopsy site done by dermatology. 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 Saw Dr. Lillia Carmel yesterday. He has been using topical antibiotics to both wound areas his original wound on the left and the biopsies/pathergy area on the right. There is definitely some improvement in the inflammation around the wound on the right although the patient states he has increasing sensitivity of the wounds. He is on prednisone 60 and doxycycline 1 as prescribed by Dr. Lillia Carmel. He is covering the topical antibiotic with gauze and putting this in his own compression stocks and changing this daily. He states that Dr. Lottie Rater did a culture of the left leg wound yesterday 05/07/17; pyoderma gangrenosum. The patient saw Dr. Lillia Carmel yesterday and has a follow-up with her in one month. He is still using topical antibiotics to both wounds although he can't recall exactly what type. He is still on prednisone 60 mg. Dr. Lillia Carmel stated that the doxycycline could stop if  we were in agreement. 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 The right medial leg was induced by [name] ([id number]) biopsy/pathergy. The area on the right is essentially healed. 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 The right medial leg has closed and remains closed. He is still on prednisone 60. He tells me he missed his last dermatology appointment with Dr. Lillia Carmel but will make another appointment. He reports that her blood sugar at a recent screen in [place] was high 200's. He was 180 today. 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 I think he is going to require still much longer prednisone perhaps another 3 months before attempting to taper. In the meantime his wound is a lot better. Smaller. He is cleaning this off daily and applying topical antibiotics. When he was last in the clinic I thought about changing to [facility] and actually put in a couple of calls to dermatology although probably not during their business hours. 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 He continues on oral prednisone. He has been using triple antibiotic ointment. There is surface debris and we will transition to [facility] and have him return in 2 weeks. He has lost 30 pounds since his last appointment with lifestyle modification. He may benefit from topical steroid cream for treatment this can be considered at a later date. 08/22/17 on evaluation today patient appears to actually be doing rather well in regard to his left lateral lower extremity ulcer. He has actually been managed by Dr. Dellia Nims most recently. Patient is currently on oral steroids at this time. This seems to have been of benefit for him. Nonetheless his last visit was actually  with Leah on 08/06/17. Currently he is not utilizing any topical steroid creams although this could be of benefit as well. No fevers, chills, nausea, or vomiting noted at this time. 09/05/17 on evaluation today patient appears to be doing better in regard to his left lateral lower extremity ulcer. He has been tolerating the dressing changes without complication. He is using Santyl with good effect. Overall I'm very pleased with how things are standing at this point. Patient likewise is happy that this is doing better. 09/19/17 on evaluation today patient actually appears to be doing rather well in regard to his left lateral lower extremity ulcer. 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 He's not having any significant pain. 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 He has been tolerating the Santyl without complication and in general I feel like he's making good progress. 10/17/17 on evaluation today patient appears to be doing very well in regard to his left lateral lower surety ulcer. He has been tolerating the dressing changes without complication. There does not appear to be any evidence of infection he's alternating the Santyl and the triple antibiotic ointment every other day this seems to be doing well for him. 11/03/17 on evaluation today patient appears to be doing very well in regard to his left lateral lower extremity  ulcer. He is been tolerating the dressing changes without complication which is good news. Fortunately there does not appear to be any evidence of infection which is also great news. Overall is doing excellent they are starting to taper down on the prednisone is down to 40 mg at this point it also started topical clobetasol for him. 11/17/17 on evaluation today patient appears to be doing well in  regard to his left lateral lower surety ulcer. He's been tolerating the dressing changes without complication. He does note that he is having no pain, no excessive drainage or discharge, and overall he feels like things are going about how he would expect and hope they would. Overall he seems to have no evidence of infection at this time in my opinion which is good news. 12/04/17-He is seen in follow-up evaluation for right lateral lower extremity ulcer. He has been applying topical steroid cream. Today's measurement show slight increase in size. Over the next 2 weeks we will transition to every other day Santyl and steroid cream. He has been encouraged to monitor for changes and notify clinic with any concerns 12/15/17 on evaluation today patient's left lateral motion the ulcer and fortunately is doing worse again at this point. This just since last week to this week has close to doubled in size according to the patient. I did not seeing last week's I do not have a visual to compare this to in our system was also down so we do not have all the charts and at this point. Nonetheless it does have me somewhat concerned in regard to the fact that again he was worried enough about it he has contact the dermatology that placed them back on the full strength, 50 mg a day of the prednisone that he was taken previous. He continues to alternate using clobetasol along with Santyl at this point. He is obviously somewhat frustrated. 12/22/17 on evaluation today patient appears to be doing a little worse compared to last evaluation. Unfortunately the wound is a little deeper and slightly larger than the last week's evaluation. With that being said he has made some progress in regard to the irritation surrounding at this time unfortunately despite that progress that's been made he still has a significant issue going on here. I'm not certain that he is having really any true infection at this time although with the  Pyoderma gangrenosum it can [name], [name] ([id number]) sometimes be difficult to differentiate infection versus just inflammation. For that reason I discussed with him today the possibility of perform a wound culture to ensure there's nothing overtly infected. 01/06/18 on evaluation today patient's wound is larger and deeper than previously evaluated. With that being said it did appear that his wound was infected after my last evaluation with him. Subsequently I did end up prescribing a prescription for Bactrim DS which she has been taking and having no complication with. Fortunately there does not appear to be any evidence of infection at this point in time as far as anything spreading, no want to touch, and overall I feel like things are showing signs of improvement. 01/13/18 on evaluation today patient appears to be even a little larger and deeper than last time. There still muscle exposed in the base of the wound. Nonetheless he does appear to be less erythematous I do believe inflammation is calming down also believe the infection looks like it's probably resolved at this time based on what I'm seeing. No fevers, chills, nausea, or vomiting noted at  this time. 01/30/18 on evaluation today patient actually appears to visually look better for the most part. Unfortunately those visually this looks better he does seem to potentially have what may be an abscess in the muscle that has been noted in the central portion of the wound. This is the first time that I have noted what appears to be fluctuance in the central portion of the muscle. With that being said I'm somewhat more concerned about the fact that this might indicate an abscess formation at this location. I do believe that an ultrasound would be appropriate. This is likely something we need to try to do as soon as possible. He has been switch to mupirocin ointment and he is no longer using the steroid ointment as prescribed by dermatology he  sees them again next week he's been decreased from 60 to 40 mg of prednisone. 03/09/18 on evaluation today patient actually appears to be doing a little better compared to last time I saw him. There's not as much erythema surrounding the wound itself. He I did review his most recent infectious disease note which was dated 02/24/18. He saw Dr. Michel Bickers in [place]. 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 Patient is now off of antibiotics and they are continually observing this. There seems to be no change in the past two weeks in my pinion based on what the patient says and what I see today compared to what Dr. Megan Salon likely saw two weeks ago. No fevers, chills, nausea, or vomiting noted at this time. 03/23/18 on evaluation today patient's wound actually appears to be showing signs of improvement which is good news. He is currently still on the Dapsone. He is also working on tapering the prednisone to get off of this and Dr. Lottie Rater is working with him in this regard. Nonetheless overall I feel like the wound is doing well it does appear based on the infectious disease note that I reviewed from Dr. 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 I did also review the results of his ultrasound of left lower extremity which revealed there was a dentist tissue in the base of the wound without an abscess noted. 04/06/18 on evaluation today the patient's left lateral lower extremity ulcer actually appears to be doing fairly well which is excellent news. There does not appear to be any evidence of infection at this time which is also great news. Overall he still does have a significantly large ulceration although little by little he seems to be making progress. He is down to 10 mg a day of the  prednisone. 04/20/18 on evaluation today patient actually appears to be doing excellent at this time in regard to his left lower extremity ulcer. He's making signs of good progress unfortunately this is taking much longer than we would really like to see but nonetheless he is making progress. Fortunately there does not appear to be any evidence of infection at this time. No fevers, chills, nausea, or vomiting noted at this time. The patient has not been using the Santyl due to the cost he hadn't got in this field yet. He's mainly been using the antibiotic ointment topically. Subsequently he also tells me that he really has not been scrubbing in the shower I think this would  be helpful again as I told him it doesn't have to be anything too aggressive to even make it believe just enough to keep it free of some of the loose slough and biofilm on the wound surface. 05/11/18 on evaluation today patient's wound appears to be making slow but sure progress in regard to the left lateral lower extremity ulcer. He is been tolerating the dressing changes without complication. Fortunately there does not appear to be any evidence of infection at this time. He is still just using triple antibiotic ointment along with clobetasol occasionally over the area. He never got the Santyl and really does not seem to intend to in my pinion. 06/01/18 on evaluation today patient appears to be doing a little better in regard to his left lateral lower extremity ulcer. He states that overall he does not feel like he is doing as well with the Dapsone as he did with the prednisone. Nonetheless he sees his dermatologist later today and is gonna talk to them about the possibility of going back on the prednisone. Overall again I believe that the wound would be better if you would utilize Santyl but he really does not seem to be interested in going back to the [place] at this point. He has been using triple antibiotic ointment. MIKLE, STERNBERG ([id number]) 06/15/18 on evaluation today patient's wound actually appears to be doing about the same at this point. Fortunately there is no signs of infection at this time. He has made slight improvements although he continues to not really want to clean the wound bed at this point. He states that he just doesn't mess with it he doesn't want to cause any problems with everything else he has going on. He has been on medication, antibiotics as prescribed by his dermatologist, for a staff infection of his lower extremities which is really drying out now and looking much better he tells me. Fortunately there is no sign of overall infection. 06/29/18 on evaluation today patient appears to be doing well in regard to his left lateral lower surety ulcer all things considering. Fortunately his staff infection seems to be greatly improved compared to previous. He has no signs of infection and this is drying up quite nicely. He is still the doxycycline for this is no longer on cental, Dapsone, or any of the other medications. His dermatologist has recommended possibility of an infusion but right now he does not want to proceed with that. 07/13/18 on evaluation today patient appears to be doing about the same in regard to his left lateral lower surety ulcer. Fortunately there's no signs of infection at this time which is great news. Unfortunately he still builds up a significant amount of Slough/biofilm of the surface of the wound he still is not really cleaning this as he should be appropriately. Again I'm able to easily with saline and gauze remove the majority of this on the surface which if you would do this at home would likely be a dramatic improvement for him as far as getting the area to improve. Nonetheless overall I still feel like he is making progress is just very slow. I think Santyl will be of benefit for him as well. Still he has not gotten this as of this point. 07/27/18 on evaluation today  patient actually appears to be doing little worse in regards of the erythema around the periwound region of the wound he also tells me that he's been having more drainage currently compared to what he was experiencing  last time I saw him. He states not quite as bad as what he had because this was infected previously but nonetheless is still appears to be doing poorly. Fortunately there is no evidence of systemic infection at this point. The patient tells me that he is not going to be able to afford the Santyl. He is still waiting to hear about the infusion therapy with his dermatologist. Apparently she wants an updated colonoscopy first. 08/10/18 on evaluation today patient appears to be doing better in regard to his left lateral lower extremity ulcer. Fortunately he is showing signs of improvement in this regard he's actually been approved for Remicade infusion's as well although this has not been scheduled as of yet. Fortunately there's no signs of active infection at this time in regard to the wound although he is having some issues with infection of the right lower extremity is been seen as dermatologist for this. Fortunately they are definitely still working with him trying to keep things under control. 09/07/18 on evaluation today patient is actually doing rather well in regard to his left lateral lower extremity ulcer. He notes these actually having some hair grow back on his extremity which is something he has not seen in years. He also tells me that the pain is really not giving them any trouble at this time which is also good news overall she is very pleased with the progress he's using a combination of the mupirocin along with the probate is all mixed. 09/21/18 on evaluation today patient actually appears to be doing fairly well all things considered in regard to his looks from the ulcer. He's been tolerating the dressing changes without complication. Fortunately there's no signs of active  infection at this time which is good news he is still on all antibiotics or prevention of the staff infection. He has been on prednisone for time although he states it is gonna contact his dermatologist and see if she put them on a short course due to some irritation that he has going on currently. Fortunately there's no evidence of any overall worsening this is going very slow I think cental would be something that would be helpful for him although he states that $50 for tube is quite expensive. He therefore is not willing to get that at this point. 10/06/18 on evaluation today patient actually appears to be doing decently well in regard to his left lateral leg ulcer. He's been tolerating the dressing changes without complication. Fortunately there's no signs of active infection at this time. Overall I'm actually rather pleased with the progress he's making although it's slow he doesn't show any signs of infection and he does seem to be making some improvement. I do believe that he may need a switch up and dressings to try to help this to heal more appropriately and quickly. 10/19/18 on evaluation today patient actually appears to be doing better in regard to his left lateral lower extremity ulcer. This is shown signs of having much less Slough buildup at this point due to the fact he has been using the [employer]. Obviously this is very good news. The overall size of the wound is not dramatically smaller but again the appearance is. 11/02/18 on evaluation today patient actually appears to be doing quite well in regard to his lower [place] ulcer. A lot of the skin around the ulcer is actually somewhat irritating at this point this seems to be more due to the dressing causing irritation from the adhesive that anything  else. Fortunately there is no signs of active infection at this time. 11/24/18 on evaluation today patient appears to be doing a little worse in regard to his overall appearance of his lower  extremity Harl, Brolin ([id number]) ulcer. There's more erythema and warmth around the wound unfortunately. He is currently on doxycycline which he has been on for some time. With that being said I'm not sure that seems to be helping with what appears to possibly be an acute cellulitis with regard to his left lower extremity ulcer. No fevers, chills, nausea, or vomiting noted at this time. 12/08/18 on evaluation today patient's wounds actually appears to be doing significantly better compared to his last evaluation. He has been using Santyl along with alternating tripling about appointment as well as the steroid cream seems to be doing quite well and the wound is showing signs of improvement which is excellent news. Fortunately there's no evidence of infection and in fact his culture came back negative with only normal skin flora noted. 12/21/2018 upon evaluation today patient actually appears to be doing excellent with regard to his ulcer. This is actually the best that I have seen it since have been helping to take care of him. It is both smaller as well as less slough noted on the surface of the wound and seems to be showing signs of good improvement with new skin growing from the edges. He has been using just the triamcinolone he does wonder if he can get a refill of that ointment today. 01/04/2019 upon evaluation today patient actually appears to be doing well with regard to his left lateral lower extremity ulcer. With that being said it does not appear to be that he is doing quite as well as last time as far as progression is concerned. There does not appear to be any signs of infection or significant irritation which is good news. With that being said I do believe that he may benefit from switching to a collagen based dressing based on how clean The wound appears. 01/18/2019 on evaluation today patient actually appears to be doing well with regard to his wound on the left lower extremity. He is  not made a lot of progress compared to where we were previous but nonetheless does seem to be doing okay at this time which is good news. There is no signs of active infection which is also good news. My only concern currently is I do wish we can get him into utilizing the collagen dressing his insurance would not pay for the supplies that we ordered although it appears that he may be able to order this through his supply company that he typically utilizes. This is [employer]. Nonetheless he did try to order it during the office visit today and it appears this did go through. We will see if he can get that it is a different brand but nonetheless he has collagen and I do think will be beneficial. 02/01/2019 on evaluation today patient actually appears to be doing a little worse today in regard to the overall size of his wounds. Fortunately there is no signs of active infection at this time. That is visually. Nonetheless when this is happened before it was due to infection. For that reason were somewhat concerned about that this time as well. 02/08/2019 on evaluation today patient unfortunately appears to be doing slightly worse with regard to his wound upon evaluation today. Is measuring a little deeper and a little larger unfortunately. I am not really sure  exactly what is causing this to enlarge he actually did see his dermatologist she is going to see about initiating Humira for him. Subsequently she also did do steroid injections into the wound itself in the periphery. Nonetheless still nonetheless he seems to be getting a little bit larger he is gone back to just using the steroid cream topically which I think is appropriate. I would say hold off on the collagen for the time being is definitely a good thing to do. Based on the culture results which we finally did get the final result back regarding it shows staph as the bacteria noted again that can be a normal skin bacteria based on the fact however  he is having increased drainage and worsening of the wound measurement wise I would go ahead and place him on an antibiotic today I do believe for this. 02/15/2019 on evaluation today patient actually appears to be doing somewhat better in regard to his ulcer. There is no signs of worsening at this time I did review his culture results which showed evidence of Staphylococcus aureus but not MRSA. Again this could just be more related to the normal skin bacteria although he states the drainage has slowed down quite a bit he may have had a mild infection not just colonization. And was much smaller and then since around10/04/2019 on evaluation today patient appears to be doing unfortunately worse as far as the size of the wound. I really feel like that this is steadily getting larger again it had been doing excellent right at the beginning of September we have seen a steady increase in the area of the wound it is almost 2-1/2 times the size it was on September 1. Obviously this is a bad trend this is not wanting to see. For that reason we went back to using just the topical triamcinolone cream which does seem to help with inflammation. I checked him for bacteria by way of culture and nothing showed positive there. I am considering giving him a short course of a tapering steroid Dosepak today to see if that is can be beneficial for him. The patient is in agreement with giving that a try. 03/08/2019 on evaluation today patient appears to be doing very well in comparison to last evaluation with regard to his lower extremity ulcer. This is showing signs of less inflammation and actually measuring slightly smaller compared to last time every other week over the past month and a half he has been measuring larger larger larger. Nonetheless I do believe that the issue has been inflammation the prednisone does seem to have been beneficial for him which is good news. No fevers, chills, nausea, vomiting, or  diarrhea. [name], [name] ([id number]) 03/22/2019 on evaluation today patient appears to be doing about the same with regard to his leg ulcer. He has been tolerating the dressing changes without complication. With that being said the wound seems to be mostly arrested at its current size but really is not making any progress except for when we prescribed the prednisone. He did show some signs of dropping as far as the overall size of the wound during that interval week. Nonetheless this is something he is not on long-term at this point and unfortunately I think he is getting need either this or else the Humira which his dermatologist has discussed try to get approval for. With that being said he will be seeing his dermatologist on the 11th of this month that is November. 04/19/2019 on evaluation today  patient appears to be doing really about the same the wound is measuring slightly larger compared to last time I saw him. He has not been into the office since November 2 due to the fact that he unfortunately had Covid as that his entire family. He tells me that it was rough but they did pull-through and he seems to be doing much better. Fortunately there is no signs of active infection at this time. No fevers, chills, nausea, vomiting, or diarrhea. 05/10/2019 on evaluation today patient unfortunately appears to be doing significantly worse as compared to last time I saw him. He does tell me that he has had his first dose of Humira and actually is scheduled to get the next one in the upcoming week. With that being said he tells me also that in the past several days he has been having a lot of issues with green drainage she showed me a picture this is more blue-green in color. He is also been having issues with increased sloughy buildup and the wound does appear to be larger today. Obviously this is not the direction that we want everything to take based on the starting of his Humira. Nonetheless I think  this is definitely a result of likely infection and to be honest I think this is probably Pseudomonas causing the infection based on what I am seeing. 05/24/2019 on evaluation today patient unfortunately appears to be doing significantly worse compared to his prior evaluation with me 2 weeks ago. I did review his culture results which showed that he does have Staph aureus as well as Pseudomonas noted on the culture. Nonetheless the Levaquin that I prescribed for him does not appear to have been appropriate and in fact he tells me he is no longer experiencing the green drainage and discharge that he had at the last visit. Fortunately there is no signs of active infection at this time which is good news although the wound has significantly worsened it in fact is much deeper than it was previous. We have been utilizing up to this point triamcinolone ointment as the prescription topical of choice but at this time I really feel like that the wound is getting need to be packed in order to appropriately manage this due to the deeper nature of the wound. Therefore something along the lines of an alginate dressing may be more appropriate. 05/31/2019 upon inspection today patient's wound actually showed signs of doing poorly at this point. Unfortunately he just does not seem to be making any good progress despite what we have tried. He actually did go ahead and pick up the Cipro and start taking that as he was noticing more green drainage he had previously completed the Levaquin that I prescribed for him as well. Nonetheless he missed his appointment for the seventh last week on Wednesday with the wound care center and [facility] where his dermatologist referred him. Obviously I do think a second opinion would be helpful at this point especially in light of the fact that the patient seems to be doing so poorly despite the fact that we have tried everything that I really know how at this point. The only thing  that ever seems to have helped him in the past is when he was on high doses of continual steroids that did seem to make a difference for him. Right now he is on immune modulating medication to try to help with the pyoderma but I am not sure that he is getting  as much relief at this point as he is previously obtained from the use of steroids. 06/07/2019 upon evaluation today patient unfortunately appears to be doing worse yet again with regard to his wound. In fact I am starting to question whether or not he may have a fluid pocket in the muscle at this point based on the bulging and the soft appearance to the central portion of the muscle area. There is not anything draining from the muscle itself at this time which is good news but nonetheless the wound is expanding. I am not really seeing any results of the Humira as far as overall wound progression based on what I am seeing at this point. The patient has been referred for second opinion with regard to his wound to the [facility] wound care center by his dermatologist which I definitely am not in opposition to. Unfortunately we tried multiple dressings in the past including collagen, alginate, and at one point even Hydrofera Blue. With that being said he is never really used it for any significant amount of time due to the fact that he often complains of pain associated with these dressings and then will go back to either using the Santyl which she has done intermittently or more frequently the triamcinolone. He is also using his own compression stockings. We have wrapped him in the past but again that was something else that he really was not a big fan of. 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 He seemed to do much better when he was on the steroids although honestly I understand that the steroids are not necessarily the best medication to be on long-term obviously 06/14/2019 on evaluation today patient appears to be doing actually a little bit better with regard to the overall appearance with his leg. Unfortunately he does continue to have issues with what appears to be some fluid underneath the muscle although he did see the wound specialty center at [facility] last week their main goals were to see about infusion therapy in place of the Humira as they feel like that is not quite strong enough. They also recommended that we continue with the treatment Cronin, Cuong ([id number]) otherwise as we are they felt like that was appropriate and they are okay with him continuing to follow-up here with [place] in that regard. With that being said they are also sending him to the vein specialist there to see about vein stripping and if that would be of benefit for him. Subsequently they also did not really address whether or not an ultrasound of the muscle area to see if there is anything that needs to be addressed here would be appropriate or not. For that reason I discussed this with him last week I think we may proceed down that road at this point. 06/21/2019 upon evaluation today patient's wound actually appears to be doing slightly better compared to previous evaluations. I do believe that he has made a difference with regard to the progression here with the use of oral steroids. Again in the past has been the only thing that is really calm things down. He does tell me that from  [facility] is gotten a good news from there that there are no further vein stripping that is necessary at this point. I do not have that available for review today although the patient did relay this to me. He also did obtain and have the ultrasound of the wound completed  which I did sign off on today. It does appear that there is no fluid collection under the muscle this is likely then just edematous tissue in general. That is also good news. Overall I still believe the inflammation is the main issue here. He did inquire about the possibility of a wound VAC again with the muscle protruding like it is I am not really sure whether the wound VAC is necessarily ideal or not. That is something we will have to consider although I do believe he may need compression wrapping to try to help with edema control which could potentially be of benefit. 06/28/2019 on evaluation today patient appears to be doing slightly better measurement wise although this is not terribly smaller he least seems to be trending towards that direction. With that being said he still seems to have purulent drainage noted in the wound bed at this time. He has been on Levaquin followed by Cipro over the past month. Unfortunately he still seems to have some issues with active infection at this time. I did perform a culture last week in order to evaluate and see if indeed there was still anything going on. Subsequently the culture did come back showing Pseudomonas which is consistent with the drainage has been having which is blue-green in color. He also has had an odor that again was somewhat consistent with Pseudomonas as well. Long story short it appears that the culture showed an intermediate finding with regard to how well the Cipro will work for the Pseudomonas infection. Subsequently being that he does not seem to be clearing up and at best what we are doing is just keeping this at [place] I think he may need to see infectious disease to discuss IV antibiotic options. 07/05/2019 upon evaluation today patient appears to be doing okay in regard to his leg ulcer. He has been tolerating the dressing changes at this point without complication. Fortunately there is no signs of active infection at this time which  is good news. No fevers, chills, nausea, vomiting, or diarrhea. With that being said he does have an appointment with infectious disease tomorrow and his primary care on Wednesday. Again the reason for the infectious disease referral was due to the fact that he did not seem to be fully resolving with the use of oral antibiotics and therefore we were thinking that IV antibiotic therapy may be necessary secondary to the fact that there was an intermediate finding for how effective the Cipro may be. Nonetheless again he has been having a lot of purulent and even green drainage. Fortunately right now that seems to have calmed down over the past week with the reinitiation of the oral antibiotic. Nonetheless we will see what Dr. Megan Salon has to say. Objective Constitutional Obese and well-hydrated in no acute distress. Vitals Time Taken: 8:15 AM, Height: 71 in, Weight: 338 lbs, BMI: 47.1, Temperature: 98.6 F, Pulse: 92 bpm, Respiratory Rate: 16 breaths/min, Blood Pressure: 131/78 mmHg. Respiratory normal breathing without difficulty. Psychiatric this patient is able to make decisions and demonstrates good insight into disease process. Alert and Oriented x 3. pleasant [name], [name] ([id number]) and cooperative. General Notes: With regard to patient's wound he still has a  significant muscle in the central portion of the wound which is exposed and somewhat bulging outward with that being said I do see evidence of this being better than it was previous still were not really where we want to be. No sharp debridement was required today. Integumentary (Hair, Skin) Wound #1 status is Open. Original cause of wound was Gradually Appeared. The wound is located on the Left,Lateral Lower Leg. The wound measures 5.7cm length x 5cm width x 0.9cm depth; 22.384cm^2 area and 20.145cm^3 volume. There is muscle and Fat Layer (Subcutaneous Tissue) Exposed exposed. There is no tunneling or undermining noted. There is  a medium amount of serous drainage noted. The wound margin is distinct with the outline attached to the wound base. There is small (1-33%) pink granulation within the wound bed. There is a large (67-100%) amount of necrotic tissue within the wound bed including Adherent Slough. 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 Anesthetic (add to Medication List): Wound #1 Left,Lateral Lower Leg: Topical Lidocaine 4% cream applied to wound bed prior to debridement (In Clinic Only). Skin Barriers/Peri-Wound Care: Wound #1 Left,Lateral Lower Leg: Triamcinolone Acetonide Ointment (TCA) Primary Wound Dressing: Dry Gauze Secondary Dressing: Wound #1 Left,Lateral Lower Leg: Boardered Foam Dressing Dressing Change Frequency: Wound #1 Left,Lateral Lower Leg: Change dressing every other day. Follow-up Appointments: Wound #1 Left,Lateral Lower Leg: Return Appointment in 1 week. Edema Control: Wound #1 Left,Lateral Lower Leg: Elevate legs to the level of the heart and pump ankles as often as possible Lara, Lavontae ([id number]) Medications-please add to medication list.: Wound #1 Left,Lateral Lower Leg: P.O. Antibiotics - Continue antibiotics 1. My suggestion at this point is good be that we continue with the current wound care measures which includes the Topical triamcinolone followed by dry gauze and then a border foam dressing. 2. I do think the patient would benefit from a compression wrap but at the same time he is going to be seeing infectious disease tomorrow who will likely 1 look at the wound as Dr. Megan Salon typically does. Therefore I do not want to put him in a wrap that can be easily removed if necessary. We will see with Dr. Megan Salon has to say about  the infection as well that seems to be ongoing and the fact that the oral medication does not seem to be completely knocking this out. 3. I am also going to recommend at this point that the patient continue with elevation as much as he can in the interim I know he has to work but when he is not working I do want him to try to elevate his legs as much as possible to keep the edema better control. We will see patient back for reevaluation in 1 week here in the clinic. If anything worsens or changes patient will contact our office for additional recommendations. Electronic Signature(s) Signed: 07/05/2019 4:23:34 PM By: Worthy Keeler PA-C Entered By: Worthy Keeler on 07/05/2019 16:23:34 [name], SAETERN ([id number]) -------------------------------------------------------------------------------- SuperBill Details Patient Name: [name] Date of Service: 07/05/2019 Medical Record Number: [id number] Patient Account Number: [id number] Date of Birth/Sex: 1980/06/26 (39 y.o. M) Treating RN: [name] Primary Care Provider: [name] Other Clinician: Referring Provider: [name] Treating Provider/Extender: Melburn Hake, Willadeen Colantuono Weeks in Treatment: 134 Diagnosis Coding ICD-10 Codes Code Description 3195730070 Non-pressure chronic ulcer of left  calf with fat layer exposed L88 Pyoderma gangrenosum I87.2 Venous insufficiency (chronic) (peripheral) L03.116 Cellulitis of left lower limb Facility Procedures CPT4 Code: [id number] Description: 86754 - WOUND CARE VISIT-LEV 3 EST PT Modifier: Quantity: 1 Physician Procedures CPT4 Code: 4920100 Description: 71219 - WC PHYS LEVEL 3 - EST PT ICD-10 Diagnosis Description L97.222 Non-pressure chronic ulcer of left calf with fat layer expo L88 Pyoderma gangrenosum I87.2 Venous insufficiency (chronic) (peripheral) L03.116 Cellulitis of left lower  limb Modifier: sed Quantity: 1 Electronic Signature(s) Signed: 07/05/2019 4:24:05 PM By: Worthy Keeler  PA-C Entered By: Worthy Keeler on 07/05/2019 16:24:05

## 2019-07-06 ENCOUNTER — Ambulatory Visit (INDEPENDENT_AMBULATORY_CARE_PROVIDER_SITE_OTHER): Payer: 59 | Admitting: Internal Medicine

## 2019-07-06 ENCOUNTER — Encounter: Payer: Self-pay | Admitting: Internal Medicine

## 2019-07-06 DIAGNOSIS — D649 Anemia, unspecified: Secondary | ICD-10-CM | POA: Diagnosis not present

## 2019-07-07 ENCOUNTER — Encounter: Payer: Self-pay | Admitting: Primary Care

## 2019-07-07 ENCOUNTER — Ambulatory Visit (INDEPENDENT_AMBULATORY_CARE_PROVIDER_SITE_OTHER): Payer: 59 | Admitting: Primary Care

## 2019-07-07 ENCOUNTER — Other Ambulatory Visit: Payer: Self-pay

## 2019-07-07 VITALS — BP 124/78 | HR 62 | Temp 96.9°F | Ht 71.0 in | Wt 333.2 lb

## 2019-07-07 DIAGNOSIS — E785 Hyperlipidemia, unspecified: Secondary | ICD-10-CM

## 2019-07-07 DIAGNOSIS — E119 Type 2 diabetes mellitus without complications: Secondary | ICD-10-CM | POA: Diagnosis not present

## 2019-07-07 DIAGNOSIS — F330 Major depressive disorder, recurrent, mild: Secondary | ICD-10-CM | POA: Diagnosis not present

## 2019-07-07 DIAGNOSIS — D649 Anemia, unspecified: Secondary | ICD-10-CM | POA: Insufficient documentation

## 2019-07-07 LAB — HEPATIC FUNCTION PANEL
ALT: 40 U/L (ref 0–53)
AST: 19 U/L (ref 0–37)
Albumin: 4.1 g/dL (ref 3.5–5.2)
Alkaline Phosphatase: 111 U/L (ref 39–117)
Bilirubin, Direct: 0.1 mg/dL (ref 0.0–0.3)
Total Bilirubin: 0.5 mg/dL (ref 0.2–1.2)
Total Protein: 6.5 g/dL (ref 6.0–8.3)

## 2019-07-07 LAB — LIPID PANEL
Cholesterol: 119 mg/dL (ref 0–200)
HDL: 47.5 mg/dL (ref >39.00)
LDL Cholesterol: 55 mg/dL (ref 0–99)
NonHDL: 71.25
Total CHOL/HDL Ratio: 3
Triglycerides: 81 mg/dL (ref 0.0–149.0)
VLDL: 16.2 mg/dL (ref 0.0–40.0)

## 2019-07-07 MED ORDER — VENLAFAXINE HCL ER 37.5 MG PO CP24: 37.5000 mg | ORAL_CAPSULE | Freq: Every day | ORAL | 1 refills | Status: DC

## 2019-07-07 MED ORDER — LANTUS SOLOSTAR 100 UNIT/ML ~~LOC~~ SOPN: 15.0000 [IU] | PEN_INJECTOR | Freq: Every day | SUBCUTANEOUS | 2 refills | Status: DC

## 2019-07-07 NOTE — Assessment & Plan Note (Signed)
Based off of glucose readings he appears uncontrolled. We discussed the absolute need to check glucose readings at least 2 times daily.  Increase Lantus to 15 units, will likely need to go further to 20 units. We will have him send [place] readings in 2 weeks.   Managed on statin. Urine microalbumin negative. Foot exam next visit.  Follow up in 2 months.

## 2019-07-07 NOTE — Progress Notes (Signed)
[name]  Reason for Consult: Possible infection of chronic left leg wound Referring Provider: Jeri Cos, PA-C  Assessment: [name]'s chronic left leg ulcer has failed to heal despite all recent treatments.  It is now about 50% larger than when I last saw him in December 2019.  It is hard to know if his positive cultures reflect active infection or simple colonization.  Although his most recent isolate is intermediately resistant to fluoroquinolone antibiotics he has had clinical improvement on levofloxacin.  I will have him complete his therapy and follow-up here in 2 weeks.  Plan: 1. Complete levofloxacin 2. Follow-up here in 2 weeks  Patient Active Problem List   Diagnosis Date Noted  . 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 Normocytic anemia 07/07/2019  . Former cigarette smoker 06/17/2018  . Ulcerative colitis ([place]) 03/05/2018  . Venous stasis 02/24/2018  . Chronic pain of left knee 12/05/2017  . Chronic pain of left ankle 12/05/2017  . Type 2 diabetes mellitus ([place]) 07/15/2017  . Seasonal allergic rhinitis 11/25/2016  . Depression 04/17/2015  . Hyperlipidemia 04/17/2015    Patient's Medications  New Prescriptions   No medications on file  Previous Medications   ACETAMINOPHEN (TYLENOL) 500 MG TABLET    Take 1,000 mg by mouth every 6 (six) hours as needed for headache (pain).   ATORVASTATIN (LIPITOR) 40 MG TABLET    Take 1 tablet (40 mg total) by mouth every evening. For cholesterol.   BLOOD GLUCOSE METER KIT AND SUPPLIES KIT    Dispense based on patient and insurance preference. Use up to four times daily as directed. (FOR ICD-9 250.00, 250.01).   CLOBETASOL OINTMENT (TEMOVATE) 0.05 %    Apply 1 application topically 2 (two) times daily.    DOXYCYCLINE (VIBRA-TABS) 100 MG TABLET    Take 100 mg by mouth 2 (two) times daily.    FLUTICASONE (FLONASE) 50 MCG/ACT NASAL SPRAY    Place 1 spray into both nostrils 2 (two) times  daily.   HUMIRA PEN 40 MG/0.8ML PNKT    Inject 1 Syringe into the skin every 14 (fourteen) days.   IBUPROFEN (ADVIL,MOTRIN) 200 MG TABLET    Take 400-600 mg by mouth every 6 (six) hours as needed for headache (pain).   INSULIN PEN NEEDLE (PEN NEEDLES) 31G X 6 MM MISC    Use nightly with insulin   LEVOFLOXACIN (LEVAQUIN) 750 MG TABLET    Take 750 mg by mouth daily.   METFORMIN (GLUCOPHAGE) 1000 MG TABLET    Take 1 tablet (1,000 mg total) by mouth 2 (two) times daily with a meal. For diabetes.   MUPIROCIN OINTMENT (BACTROBAN) 2 %    Apply topically two (2) 60g times a day. Mix with clobetasol and apply twice daily   PREDNISONE (STERAPRED UNI-PAK 48 TAB) 10 MG (48) TBPK TABLET       VENLAFAXINE XR (EFFEXOR-XR) 150 MG 24 HR CAPSULE    TAKE 1 CAPSULE BY MOUTH ONCE DAILY WITH BREAKFAST  Modified Medications   Modified Medication Previous Medication   INSULIN GLARGINE (LANTUS SOLOSTAR) 100 UNIT/ML SOLOSTAR PEN Insulin Glargine (LANTUS SOLOSTAR) 100 UNIT/ML Solostar Pen      Inject 15 Units into the skin at bedtime.    Inject 10 Units into the skin at bedtime.   VENLAFAXINE XR (EFFEXOR-XR) 37.5 MG 24 HR CAPSULE venlafaxine XR (EFFEXOR-XR) 37.5 MG 24 hr capsule      Take 1 capsule (37.5  mg total) by mouth daily with breakfast. For depression    Take 1 capsule (37.5 mg total) by mouth daily with breakfast. For depression  Discontinued Medications   PREDNISONE (DELTASONE) 10 MG TABLET    As directed    HPI: [name] is a 39 y.o. male with a history of ulcerative colitis and lower extremity venous stasis who has had a chronic left lower leg wound for many years.  He has been followed by [facility] dermatology with a diagnosis of pyoderma gangrenosum.  He has been on multiple treatments over the past several years but has never been able to completely heal the wound.  He has been on intermittent steroid therapy.  He was treated with dapsone 2 years ago.  He started infliximab several months ago.  He has been  followed at the wound center for many years.  He has been colonized, infected with multiple organisms.  He has been treated for possible MRSA superinfection on several occasions and has been on chronic doxycycline therapy.  A wound culture obtained last September grew MSSA resistant to tetracyclines.  In late December a wound culture grew Pseudomonas sensitive to all antibiotics tested and MSSA.  He was treated with ciprofloxacin.  He recalls that he did not note much improvement in his wound or wound drainage.  He developed more drainage several weeks ago that was darker brown in color.  A wound culture on 06/21/2019 grew Pseudomonas again that was intermediately resistant to ciprofloxacin.  He started on levofloxacin last week and has noted improvement in the drainage.  It has decreased in amount and is much clearer now.  Review of Systems: Review of Systems  Constitutional: Negative for chills, fever and weight loss.  Gastrointestinal: Negative for abdominal pain, diarrhea, nausea and vomiting.  Skin: Negative for rash.       As noted in HPI.      Past Medical History:  Diagnosis Date  . Blood in stool   . Depression   . Elevated blood pressure   . Hyperlipidemia   . OSA (obstructive sleep apnea) 2014  . Seasonal allergies   . Ulcerative colitis ([place]) 03/05/2018    Social History   Tobacco Use  . Smoking status: Former Smoker    Packs/day: 2.00    Years: 14.00    Pack years: 28.00    Types: Cigarettes    Quit date: 11/25/2018    Years since quitting: 0.6  . Smokeless tobacco: Former Systems developer    Quit date: 03/17/2015  Substance Use Topics  . Alcohol use: Yes    Alcohol/week: 0.0 standard drinks    Comment: soical  . Drug use: Not on file    Family History  Problem Relation Age of Onset  . Alcohol abuse Paternal Aunt   . Alcohol abuse Paternal Uncle   . Stroke Paternal Uncle   . Hyperlipidemia Maternal Grandfather   . Hypertension Maternal Grandfather   . Diabetes Maternal  Grandfather   . Alcohol abuse Maternal Grandfather   . Multiple sclerosis Mother   . Dementia Mother    Allergies  Allergen Reactions  . Biaxin [Clarithromycin] Other (See Comments)    Causes colitis flares  . Milk-Related Compounds Hives    OBJECTIVE: Vitals:   07/06/19 1507  BP: 125/72  Pulse: 94  Temp: 98.6 F (37 C)  TempSrc: Oral  SpO2: 96%  Weight: (!) 334 lb (151.5 kg)  Height: 5' 11"  (1.803 m)   Body mass index is 46.58 kg/m.  Physical Exam Constitutional:      Appearance: He is not ill-appearing.     Comments: He is pleasant and in no distress.  Musculoskeletal:        General: No swelling or tenderness.  Skin:    Comments: He has a large chronic appearing ulcer on his left lateral calf that measures about 1-1/2 inches in diameter.  The border is raised.  There is shaggy exudate on the ulcer base and yellow drainage on his dressing.  There is no odor.  There is no surrounding redness or fluctuance.  Neurological:     General: No focal deficit present.  Psychiatric:        Mood and Affect: Mood normal.     Microbiology: No results found for this or any previous visit (from the past 240 hour(s)).  Michel Bickers, MD [facility] for Infectious [facility] [phone] pager   [phone] cell 07/07/2019, 9:02 AM

## 2019-07-07 NOTE — Patient Instructions (Signed)
Stop by the lab prior to leaving today. I will notify you of your results once received.   We've increased the dose of your Lantus to 15 units.  Start checking your blood sugar levels.  Appropriate times to check your blood sugar levels are:  -Before any meal (breakfast, lunch, dinner) -Two hours after any meal (breakfast, lunch, dinner) -Bedtime  Record your readings and notify me if you continue to consistently run at or above 200 after two weeks.  Please schedule a follow up appointment in 2 months for diabetes check.  It was a pleasure to see you today!   Diabetes Mellitus and Nutrition, Adult When you have diabetes (diabetes mellitus), it is very important to have healthy eating habits because your blood sugar (glucose) levels are greatly affected by what you eat and drink. Eating healthy foods in the appropriate amounts, at about the same times every day, can help you:  Control your blood glucose.  Lower your risk of heart disease.  Improve your blood pressure.  Reach or maintain a healthy weight. Every person with diabetes is different, and each person has different needs for a meal plan. Your health care provider may recommend that you work with a diet and nutrition specialist (dietitian) to make a meal plan that is best for you. Your meal plan may vary depending on factors such as:  The calories you need.  The medicines you take.  Your weight.  Your blood glucose, blood pressure, and cholesterol levels.  Your activity level.  Other health conditions you have, such as heart or kidney disease. How do carbohydrates affect me? Carbohydrates, also called carbs, affect your blood glucose level more than any other type of food. Eating carbs naturally raises the amount of glucose in your blood. Carb counting is a method for keeping track of how many carbs you eat. Counting carbs is important to keep your blood glucose at a healthy level, especially if you use insulin or  take certain oral diabetes medicines. It is important to know how many carbs you can safely have in each meal. This is different for every person. Your dietitian can help you calculate how many carbs you should have at each meal and for each snack. Foods that contain carbs include:  Bread, cereal, rice, pasta, and crackers.  Potatoes and corn.  Peas, beans, and lentils.  Milk and yogurt.  Fruit and juice.  Desserts, such as cakes, cookies, ice cream, and candy. How does alcohol affect me? Alcohol can cause a sudden decrease in blood glucose (hypoglycemia), especially if you use insulin or take certain oral diabetes medicines. Hypoglycemia can be a life-threatening condition. Symptoms of hypoglycemia (sleepiness, dizziness, and confusion) are similar to symptoms of having too much alcohol. If your health care provider says that alcohol is safe for you, follow these guidelines:  Limit alcohol intake to no more than 1 drink per day for nonpregnant women and 2 drinks per day for men. One drink equals 12 oz of beer, 5 oz of wine, or 1 oz of hard liquor.  Do not drink on an empty stomach.  Keep yourself hydrated with water, diet soda, or unsweetened iced tea.  Keep in mind that regular soda, juice, and other mixers may contain a lot of sugar and must be counted as carbs. What are tips for following this plan?  Reading food labels  Start by checking the serving size on the "Nutrition Facts" label of packaged foods and drinks. The amount of calories, carbs,  fats, and other nutrients listed on the label is based on one serving of the item. Many items contain more than one serving per package.  Check the total grams (g) of carbs in one serving. You can calculate the number of servings of carbs in one serving by dividing the total carbs by 15. For example, if a food has 30 g of total carbs, it would be equal to 2 servings of carbs.  Check the number of grams (g) of saturated and trans fats in  one serving. Choose foods that have low or no amount of these fats.  Check the number of milligrams (mg) of salt (sodium) in one serving. Most people should limit total sodium intake to less than 2,300 mg per day.  Always check the nutrition information of foods labeled as "low-fat" or "nonfat". These foods may be higher in added sugar or refined carbs and should be avoided.  Talk to your dietitian to identify your daily goals for nutrients listed on the label. Shopping  Avoid buying canned, premade, or processed foods. These foods tend to be high in fat, sodium, and added sugar.  Shop around the outside edge of the grocery store. This includes fresh fruits and vegetables, bulk grains, fresh meats, and fresh dairy. Cooking  Use low-heat cooking methods, such as baking, instead of high-heat cooking methods like deep frying.  Cook using healthy oils, such as olive, canola, or sunflower oil.  Avoid cooking with butter, cream, or high-fat meats. Meal planning  Eat meals and snacks regularly, preferably at the same times every day. Avoid going long periods of time without eating.  Eat foods high in fiber, such as fresh fruits, vegetables, beans, and whole grains. Talk to your dietitian about how many servings of carbs you can eat at each meal.  Eat 4-6 ounces (oz) of lean protein each day, such as lean meat, chicken, fish, eggs, or tofu. One oz of lean protein is equal to: ? 1 oz of meat, chicken, or fish. ? 1 egg. ?  cup of tofu.  Eat some foods each day that contain healthy fats, such as avocado, nuts, seeds, and fish. Lifestyle  Check your blood glucose regularly.  Exercise regularly as told by your health care provider. This may include: ? 150 minutes of moderate-intensity or vigorous-intensity exercise each week. This could be brisk walking, biking, or water aerobics. ? Stretching and doing strength exercises, such as yoga or weightlifting, at least 2 times a week.  Take  medicines as told by your health care provider.  Do not use any products that contain nicotine or tobacco, such as cigarettes and e-cigarettes. If you need help quitting, ask your health care provider.  Work with a Social worker or diabetes educator to identify strategies to manage stress and any emotional and social challenges. Questions to ask a health care provider  Do I need to meet with a diabetes educator?  Do I need to meet with a dietitian?  What number can I call if I have questions?  When are the best times to check my blood glucose? Where to find more information:  American Diabetes Association: diabetes.org  Academy of Nutrition and Dietetics: www.eatright.[employer] of Diabetes and Digestive and Kidney Diseases (NIH): [employer] Summary  A healthy meal plan will help you control your blood glucose and maintain a healthy lifestyle.  Working with a diet and nutrition specialist (dietitian) can help you make a meal plan that is best for you.  Keep in  mind that carbohydrates (carbs) and alcohol have immediate effects on your blood glucose levels. It is important to count carbs and to use alcohol carefully. This information is not intended to replace advice given to you by your health care provider. Make sure you discuss any questions you have with your health care provider. Document Revised: 04/18/2017 Document Reviewed: 06/10/2016 Elsevier Patient Education  2020 [employer].

## 2019-07-07 NOTE — Assessment & Plan Note (Signed)
Improved with addition of venlafaxine 37.5 mg. Continue dose of 187.5 mg daily.

## 2019-07-07 NOTE — Progress Notes (Signed)
Subjective:    Patient ID: [name], male    DOB: 12/27/1980, 39 y.o.   MRN: [id number]  HPI  This visit occurred during the SARS-CoV-2 public health emergency.  Safety protocols were in place, including screening questions prior to the visit, additional usage of staff PPE, and extensive cleaning of exam room while observing appropriate contact time as indicated for disinfecting solutions.   [name] is a 40 year old male with a history of ulcerative colitis, type 2 diabetes, pyoderma gangrenosum, hyperlipidemia, tobacco abuse, depression who presents today for follow up.  1) Depression/Anxiety: He was last evaluated in early January 2021 with reports of anxiety, feeling down/depressed, feeling overwhelmed at work despite use of venlafaxine ER 150 for which he'd been taking for years. Overall he felt as though venlafaxine had been effective over the years. Given breakthrough symptoms on dose of 150 mg we added 37.5 mg to his current venlafaxine prescription.  Since his last visit he's doing better with the extra 37.5 mg. Positive effects include feeling less anxious, feeling more calm, feeling less sad/down. He would like to continue at this current dose of 187.5 mg.  2)  Type 2 Diabetes:  Current medications include: Lantus 10 units HS, Metformin 1000 mg BID.  He is checking his blood glucose 1-2 times weekly now, was checking 4 times daily. He is getting readings of:  AM fasting ( no recent readings) low 200's Before Dinner ( no recent readings) After dinner: 200's-300's Bedtime: 200's-300's  Lowest reading: 159 Highest reading: 500  Last A1C: 11.1 Last Eye Exam: UTD Last Foot Exam: Due Pneumonia Vaccination: Declines ACE/ARB: None. Urine microalbumin negative in 2021 Statin: atorvastatin    Review of Systems  Eyes: Negative for visual disturbance.  Respiratory: Negative for shortness of breath.   Cardiovascular: Negative for chest pain.  Neurological: Negative for  dizziness and headaches.       Past Medical History:  Diagnosis Date  . Blood in stool   . Depression   . Elevated blood pressure   . Hyperlipidemia   . OSA (obstructive sleep apnea) 2014  . Seasonal allergies   . Ulcerative colitis ([place]) 03/05/2018     Social History   Socioeconomic History  . Marital status: Married    Spouse name: Not on file  . Number of children: Not on file  . Years of education: Not on file  . Highest education level: Not on file  Occupational History  . Not on file  Tobacco Use  . Smoking status: Former Smoker    Packs/day: 2.00    Years: 14.00    Pack years: 28.00    Types: Cigarettes    Quit date: 11/25/2018    Years since quitting: 0.6  . Smokeless tobacco: Former Systems developer    Quit date: 03/17/2015  Substance and Sexual Activity  . Alcohol use: Yes    Alcohol/week: 0.0 standard drinks    Comment: soical  . Drug use: Not on file  . Sexual activity: Not on file  Other Topics Concern  . Not on file  Social History Narrative   Married.   7 kids.   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   Enjoys target shooting.    Social Determinants of Health   Financial Resource Strain:   . Difficulty of Paying Living Expenses: Not on file  Food Insecurity:   . Worried About Charity fundraiser in the Last Year: Not on file  . Ran Out of Food  in the Last Year: Not on file  Transportation Needs:   . Lack of Transportation (Medical): Not on file  . Lack of Transportation (Non-Medical): Not on file  Physical Activity:   . Days of Exercise per Week: Not on file  . Minutes of Exercise per Session: Not on file  Stress:   . Feeling of Stress : Not on file  Social Connections:   . Frequency of Communication with Friends and Family: Not on file  . Frequency of Social Gatherings with Friends and Family: Not on file  . Attends Religious Services: Not on file  . Active Member of Clubs or Organizations: Not on file  . Attends Archivist Meetings: Not  on file  . Marital Status: Not on file  Intimate Partner Violence:   . Fear of Current or Ex-Partner: Not on file  . Emotionally Abused: Not on file  . Physically Abused: Not on file  . Sexually Abused: Not on file    Past Surgical History:  Procedure Laterality Date  . SEPTOPLASTY  2007  . WRIST SURGERY      Family History  Problem Relation Age of Onset  . Alcohol abuse Paternal Aunt   . Alcohol abuse Paternal Uncle   . Stroke Paternal Uncle   . Hyperlipidemia Maternal Grandfather   . Hypertension Maternal Grandfather   . Diabetes Maternal Grandfather   . Alcohol abuse Maternal Grandfather   . Multiple sclerosis Mother   . Dementia Mother     Allergies  Allergen Reactions  . Biaxin [Clarithromycin] Other (See Comments)    Causes colitis flares  . Milk-Related Compounds Hives    Current Outpatient Medications on File Prior to Visit  Medication Sig Dispense Refill  . acetaminophen (TYLENOL) 500 MG tablet Take 1,000 mg by mouth every 6 (six) hours as needed for headache (pain).    Marland Kitchen atorvastatin (LIPITOR) 40 MG tablet Take 1 tablet (40 mg total) by mouth every evening. For cholesterol. 90 tablet 3  . blood glucose meter kit and supplies KIT Dispense based on patient and insurance preference. Use up to four times daily as directed. (FOR ICD-9 250.00, 250.01). 1 each 0  . clobetasol ointment (TEMOVATE) 3.41 % Apply 1 application topically 2 (two) times daily.     Marland Kitchen doxycycline (VIBRA-TABS) 100 MG tablet Take 100 mg by mouth 2 (two) times daily.     . fluticasone (FLONASE) 50 MCG/ACT nasal spray Place 1 spray into both nostrils 2 (two) times daily. 16 g 6  . HUMIRA PEN 40 MG/0.8ML PNKT Inject 1 Syringe into the skin every 14 (fourteen) days.    Marland Kitchen ibuprofen (ADVIL,MOTRIN) 200 MG tablet Take 400-600 mg by mouth every 6 (six) hours as needed for headache (pain).    . Insulin Pen Needle (PEN NEEDLES) 31G X 6 MM MISC Use nightly with insulin 100 each 2  . levofloxacin (LEVAQUIN)  750 MG tablet Take 750 mg by mouth daily.    . metFORMIN (GLUCOPHAGE) 1000 MG tablet Take 1 tablet (1,000 mg total) by mouth 2 (two) times daily with a meal. For diabetes. 180 tablet 3  . mupirocin ointment (BACTROBAN) 2 % Apply topically two (2) 60g times a day. Mix with clobetasol and apply twice daily    . predniSONE (STERAPRED UNI-PAK 48 TAB) 10 MG (48) TBPK tablet     . venlafaxine XR (EFFEXOR-XR) 150 MG 24 hr capsule TAKE 1 CAPSULE BY MOUTH ONCE DAILY WITH BREAKFAST 90 capsule 1   No current facility-administered  medications on file prior to visit.    BP 124/78   Pulse 62   Temp (!) 96.9 F (36.1 C) (Temporal)   Ht 5' 11"  (1.803 m)   Wt (!) 333 lb 4 oz (151.2 kg)   SpO2 98%   BMI 46.48 kg/m    Objective:   Physical Exam  Constitutional: He appears well-nourished.  Cardiovascular: Normal rate and regular rhythm.  Respiratory: Effort normal and breath sounds normal.  Musculoskeletal:     Cervical back: Neck supple.  Skin: Skin is warm and dry.  Psychiatric: He has a normal mood and affect.           Assessment & Plan:

## 2019-07-12 ENCOUNTER — Other Ambulatory Visit: Payer: Self-pay

## 2019-07-12 ENCOUNTER — Encounter: Payer: 59 | Admitting: Physician Assistant

## 2019-07-12 DIAGNOSIS — L97222 Non-pressure chronic ulcer of left calf with fat layer exposed: Secondary | ICD-10-CM | POA: Diagnosis not present

## 2019-07-13 DIAGNOSIS — L98499 Non-pressure chronic ulcer of skin of other sites with unspecified severity: Principal | ICD-10-CM

## 2019-07-13 MED ORDER — DOXYCYCLINE HYCLATE 100 MG TABLET
ORAL_TABLET | 0 refills | 0.00000 days | Status: CP
Start: 2019-07-13 — End: ?

## 2019-07-15 ENCOUNTER — Other Ambulatory Visit: Payer: Self-pay

## 2019-07-15 DIAGNOSIS — L97222 Non-pressure chronic ulcer of left calf with fat layer exposed: Secondary | ICD-10-CM | POA: Diagnosis not present

## 2019-07-15 NOTE — Progress Notes (Signed)
RAYMEL, CULL ([id number]) Visit Report for 07/12/2019 Chief Complaint Document Details Patient Name: [name] Date of Service: 07/12/2019 8:15 AM Medical Record Number: [id number] Patient Account Number: [id number] Date of Birth/Sex: 09/02/1980 (39 y.o. M) Treating RN: [name] Primary Care Provider: [name] Other Clinician: Referring Provider: [name] Treating Provider/Extender: Melburn Hake, Lennis Rader Weeks in Treatment: 135 Information Obtained from: Patient Chief Complaint He is here in follow up evaluation for LLE pyoderma ulcer Electronic Signature(s) Signed: 07/12/2019 5:29:33 PM By: Worthy Keeler PA-C Entered By: Worthy Keeler on 07/12/2019 08:19:56 Sancho, [name] ([id number]) -------------------------------------------------------------------------------- HPI Details Patient Name: [name] Date of Service: 07/12/2019 8:15 AM Medical Record Number: [id number] Patient Account Number: [id number] Date of Birth/Sex: Nov 21, 1980 (39 y.o. M) Treating RN: [name] Primary Care Provider: [name] Other Clinician: Referring Provider: [name] Treating Provider/Extender: Melburn Hake, Amedio Bowlby Weeks in Treatment: 135 History of Present Illness HPI Description: 12/04/16; 39 year old man who comes into the clinic today for review of a wound on the posterior left calf. He tells me that is been there for about a year. He is not a diabetic he does smoke half a pack per day. He was seen in the ER on 11/20/16 felt to have cellulitis around the wound and was given clindamycin. An x-ray did not show osteomyelitis. The patient initially tells me that he has a milk allergy that sets off a pruritic itching rash on his lower legs which she scratches incessantly and he thinks that's what may have set up the wound. He has been using various topical antibiotics and ointments without any effect. He works in a [employer] and is on his feet all day. He does not have a prior  history of wounds however he does have the rash on both lower legs the right arm and the ventral aspect of his left arm. These are excoriations and clearly have had scratching however there are of macular looking areas on both legs including a substantial larger area on the right leg. This does not have an underlying open area. There is no blistering. The patient tells me that 2 years ago in [place] in response to the rash on his legs he saw a dermatologist who told him he had a condition which may be pyoderma gangrenosum although I may be putting words into his mouth. He seemed to recognize this. 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 He is not in any treatment for this. He's had no recent travel 12/11/16; the patient arrives today with his wound and roughly the same condition we've been using silver alginate this is a deep punched out wound with some surrounding erythema but no tenderness. Biopsy I did did not show confirmed pyoderma gangrenosum suggested nonspecific inflammation and vasculitis but does not provide an actual description of what was seen by the pathologist. I'm really not able to understand this We have also received information from the patient's dermatologist in [place] notes from April 2016. This was a doctor Agarwal-antal. 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 He was prescribed topical steroid high potency under occlusion which helped but at this point the patient did not have a deep punched out wound. 12/18/16; the patient's wound is larger in terms of surface area however this surface looks better and there is less depth. The surrounding erythema also is better. The patient states that the wrap we put on came off 2 days ago when he has been using his  compression stockings. He we are in the process of getting a dermatology consult. 12/26/16 on evaluation today patient's left lower extremity wound shows evidence of  infection with surrounding erythema noted. He has been tolerating the dressing changes but states that he has noted more discomfort. There is a larger area of erythema surrounding the wound. No fevers, chills, nausea, or vomiting noted at this time. With that being said the wound still does have slough covering the surface. He is not allergic to any medication that he is aware of at this point. In regard to his right lower extremity he had several regions that are erythematous and pruritic he wonders if there's anything we can do to help that. 01/02/17 I reviewed patient's wound culture which was obtained his visit last week. He was placed on doxycycline at that point. Unfortunately that does not appear to be an antibiotic that would likely help with the situation however the pseudomonas noted on culture is sensitive to Cipro. Also unfortunately patient's wound seems to have a large compared to last week's evaluation. Not severely so but there are definitely increased measurements in general. He is continuing to have discomfort as well he writes this to be a seven out of 10. In fact he would prefer me not to perform any debridement today due to the fact that he is having discomfort and considering he has an active infection on the little reluctant to do so anyway. No fevers, chills, nausea, or vomiting noted at this time. 01/08/17; patient seems dermatology on September 5. I suspect dermatology will want the slides from the biopsy I did sent to their pathologist. I'm not sure if there is a way we can expedite that. In any case the culture I did before I left on vacation 3 weeks ago showed Pseudomonas he was given 10 days of Cipro and per her description of her intake nurses is actually somewhat better this week although the wound is quite a bit bigger than I remember the last time I saw this. He still has 3 more days of Cipro 01/21/17; dermatology appointment tomorrow. He has completed the ciprofloxacin  for Pseudomonas. Surface of the wound looks better however he is had some deterioration in the lesions on his right leg. Meantime the left lateral leg wound we will continue with sample 01/29/17; patient had his dermatology appointment but I can't yet see that note. He is completed his antibiotics. The wound is more superficial but considerably larger in circumferential area than when he came in. This is in his left lateral calf. He also has swollen erythematous areas with superficial wounds on the right leg and small papular areas on both arms. There apparently areas in her his upper thighs and buttocks I did not look at those. Dermatology biopsied the right leg. Hopefully will have their input next week. 02/05/17; patient went back to see his dermatologist who told him that he had a "scratching problem" as well as staph. He is now on a 30 day course of doxycycline and I believe she gave him triamcinolone cream to the right leg areas to help with the itching [not exactly sure but probably triamcinolone]. She apparently looked at the left lateral leg wound although this was not rebiopsied and I think felt to be ultimately part of the same pathogenesis. He is using sample border foam and changing nevus himself. He now has a new open area on the right posterior leg which was his biopsy site I don't have any of the  dermatology notes 02/12/17; we put the patient in compression last week with SANTYL to the wound on the left leg and the biopsy. Edema is much better and the depth of the wound is now at level of skin. Area is still the same oBiopsy site on the right lateral leg we've also been using santyl with a border foam dressing and he is changing this himself. 02/19/17; Using silver alginate started last week to both the substantial left leg wound and the biopsy site on the right wound. He is tolerating compression well. Has a an appointment with his primary M.D. tomorrow wondering about diuretics although  I'm wondering if the edema problem is AMMIEL, GUINEY ([id number]) actually lymphedema 02/26/17; the patient has been to see his primary doctor Dr. Jerrel Ivory at [place] our primary care. She started him on Lasix 20 mg and this seems to have helped with the edema. However we are not making substantial change with the left lateral calf wound and inflammation. The biopsy site on the right leg also looks stable but not really all that different. 03/12/17; the patient has been to see vein and vascular Dr. Lucky Cowboy. He has had venous reflux studies I have not reviewed these. I did get a call from his dermatology office. 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 They therefore recommended clobetasol and tetracycline i.e. 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 03/26/17; apparently the patient just had reflux studies not an appointment with Dr. dew. She arrives in clinic today having applied clobetasol for 2-3 weeks. He notes over the last 2-3 days excessive drainage having to change the dressing 3-4 times a day and also expanding erythema. He states the expanding erythema seems to come and go and was last this red was earlier in the month.he is on doxycycline 150 mg twice a day as an anti- inflammatory systemic therapy for possible pyoderma gangrenosum along with the topical clobetasol 04/02/17; the patient was seen last week by Dr. Lillia Carmel at [facility] dermatology locally who kindly saw him at my request. 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 My concern was the degree of erythema medially extending from his left leg wound which was either inflammation from pyoderma  or cellulitis. I put him on Augmentin however culture of the wound showed Pseudomonas which is quinolone sensitive. I really don't believe he has cellulitis however in view of everything I will continue and give him a course of Cipro. He is also on doxycycline as an immune modulator for the pyoderma. In addition to his original wound on the left lateral leg with surrounding erythema he has a wound on the right posterior calf which was an original biopsy site done by dermatology. 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 Saw Dr. Lillia Carmel yesterday. He has been using topical antibiotics to both wound areas his original wound on the left and the biopsies/pathergy area on the right. There is definitely some improvement in the inflammation around the wound on the right although the patient states he has increasing sensitivity of the wounds. He is on prednisone 60 and doxycycline 1 as prescribed by Dr. Lillia Carmel. He is covering the topical  antibiotic with gauze and putting this in his own compression stocks and changing this daily. He states that Dr. Lottie Rater did a culture of the left leg wound yesterday 05/07/17; pyoderma gangrenosum. The patient saw Dr. Lillia Carmel yesterday and has a follow-up with her in one month. He is still using topical antibiotics to both wounds although he can't recall exactly what type. He is still on prednisone 60 mg. Dr. Lillia Carmel stated that the doxycycline could stop if we were in agreement. 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 The right medial leg was induced by biopsy/pathergy. The area on the right is essentially healed. 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 The right medial leg has closed and remains closed. He  is still on prednisone 60. oHe tells me he missed his last dermatology appointment with Dr. Lillia Carmel but will make another appointment. He reports that her blood sugar at a recent screen in [place] was high 200's. He was 180 today. 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 I think he is going to require still much longer prednisone perhaps another 3 months before attempting to taper. In the meantime his wound is a lot better. Smaller. He is cleaning this off daily and applying topical antibiotics. When he was last in the clinic I thought about changing to [facility] and actually put in a couple of calls to dermatology although probably not during their business hours. 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 He continues on oral prednisone. He has been using triple antibiotic ointment. There is surface debris and we will transition to [facility] and have him return in 2 weeks. He has lost 30 pounds since his last appointment with lifestyle modification. He may benefit from topical steroid cream for treatment this can be considered at a later date. 08/22/17 on evaluation today patient appears to actually be doing rather well in regard to his left lateral lower extremity ulcer. He has actually been managed by Dr. Dellia Nims most recently. Patient is currently on oral steroids at this time. This seems to have been of benefit for him. Nonetheless his last visit was actually with Leah on 08/06/17. Currently he is not utilizing any topical steroid creams although this could be of benefit as well. No fevers, chills, nausea, or vomiting noted at this time. 09/05/17 on evaluation today patient appears to be doing better in regard to his left lateral lower extremity ulcer. He has been tolerating the dressing changes without complication. He is using Santyl with good effect. Overall I'm  very pleased with how things are standing at this point. Patient likewise is happy that this is doing better. 09/19/17 on evaluation today patient actually appears to be doing rather well in regard to his left lateral lower extremity ulcer. 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 He's not having any significant pain. 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 He has been tolerating the Santyl without complication and in general I feel like he's making good progress. 10/17/17 on  evaluation today patient appears to be doing very well in regard to his left lateral lower surety ulcer. He has been tolerating the dressing changes without complication. There does not appear to be any evidence of infection he's alternating the Santyl and the triple antibiotic ointment every other day this seems to be doing well for him. 11/03/17 on evaluation today patient appears to be doing very well in regard to his left lateral lower extremity ulcer. He is been tolerating the dressing changes without complication which is good news. Fortunately there does not appear to be any evidence of infection which is also great news. Overall is doing excellent they are starting to taper down on the prednisone is down to 40 mg at this point it also started topical clobetasol for him. 11/17/17 on evaluation today patient appears to be doing well in regard to his left lateral lower surety ulcer. He's been tolerating the dressing changes without complication. He does note that he is having no pain, no excessive drainage or discharge, and overall he feels like things are going about how he would expect and hope they would. Overall he seems to have no evidence of infection at this time in my opinion which is good news. 12/04/17-He is seen in follow-up  evaluation for right lateral lower extremity ulcer. He has been applying topical steroid cream. Today's measurement show slight increase in size. Over the next 2 weeks we will transition to every other day Santyl and steroid cream. He has been encouraged to monitor for changes and notify clinic with any concerns 12/15/17 on evaluation today patient's left lateral motion the ulcer and fortunately is doing worse again at this point. This just since last week to this [place] ([id number]) week has close to doubled in size according to the patient. I did not seeing last week's I do not have a visual to compare this to in our system was also down so we do not have all the charts and at this point. Nonetheless it does have me somewhat concerned in regard to the fact that again he was worried enough about it he has contact the dermatology that placed them back on the full strength, 50 mg a day of the prednisone that he was taken previous. He continues to alternate using clobetasol along with Santyl at this point. He is obviously somewhat frustrated. 12/22/17 on evaluation today patient appears to be doing a little worse compared to last evaluation. Unfortunately the wound is a little deeper and slightly larger than the last week's evaluation. With that being said he has made some progress in regard to the irritation surrounding at this time unfortunately despite that progress that's been made he still has a significant issue going on here. 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 For that reason I discussed with him today the possibility of perform a wound culture to ensure there's nothing overtly infected. 01/06/18 on evaluation today patient's wound is larger and deeper than previously evaluated. With that being said it did appear that his wound  was infected after my last evaluation with him. Subsequently I did end up prescribing a prescription for Bactrim DS which she has been taking and having no complication with. Fortunately there does not appear to be any evidence of infection at this point in time as far as anything spreading, no want to touch, and overall  I feel like things are showing signs of improvement. 01/13/18 on evaluation today patient appears to be even a little larger and deeper than last time. There still muscle exposed in the base of the wound. Nonetheless he does appear to be less erythematous I do believe inflammation is calming down also believe the infection looks like it's probably resolved at this time based on what I'm seeing. No fevers, chills, nausea, or vomiting noted at this time. 01/30/18 on evaluation today patient actually appears to visually look better for the most part. Unfortunately those visually this looks better he does seem to potentially have what may be an abscess in the muscle that has been noted in the central portion of the wound. This is the first time that I have noted what appears to be fluctuance in the central portion of the muscle. With that being said I'm somewhat more concerned about the fact that this might indicate an abscess formation at this location. I do believe that an ultrasound would be appropriate. This is likely something we need to try to do as soon as possible. He has been switch to mupirocin ointment and he is no longer using the steroid ointment as prescribed by dermatology he sees them again next week he's been decreased from 60 to 40 mg of prednisone. 03/09/18 on evaluation today patient actually appears to be doing a little better compared to last time I saw him. There's not as much erythema surrounding the wound itself. He I did review his most recent infectious disease note which was dated 02/24/18. He saw Dr. Michel Bickers in [place]. 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 Patient is now off of antibiotics and they are continually observing this. There seems to be no change in the past two weeks in my pinion based on what the patient says and what I see today compared to what Dr. Megan Salon likely saw two weeks ago. No fevers, chills, nausea, or vomiting noted at this time. 03/23/18 on evaluation today patient's wound actually appears to be showing signs of improvement which is good news. He is currently still on the Dapsone. He is also working on tapering the prednisone to get off of this and Dr. Lottie Rater is working with him in this regard. Nonetheless overall I feel like the wound is doing well it does appear based on the infectious disease note that I reviewed from Dr. 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 I did also review the results of his ultrasound of left lower extremity which revealed there was a dentist tissue in the base of the wound without an abscess noted. 04/06/18 on evaluation today the patient's left lateral lower extremity ulcer actually appears to be doing fairly well which is excellent news. There does not appear to be any evidence of infection at this time which is also great news. Overall he still does have a significantly large ulceration although little by little he seems to be making progress. He is down to 10 mg a day of the prednisone. 04/20/18 on evaluation today patient actually appears to be doing excellent at this time in regard to his left lower extremity ulcer. He's making signs of good progress unfortunately this is taking much longer than we would really like to see but  nonetheless he is making progress. Fortunately there does not appear to be any evidence of infection at this time. No fevers, chills, nausea, or vomiting noted  at this time. The patient has not been using the Santyl due to the cost he hadn't got in this field yet. He's mainly been using the antibiotic ointment topically. Subsequently he also tells me that he really has not been scrubbing in the shower I think this would be helpful again as I told him it doesn't have to be anything too aggressive to even make it believe just enough to keep it free of some of the loose slough and biofilm on the wound surface. 05/11/18 on evaluation today patient's wound appears to be making slow but sure progress in regard to the left lateral lower extremity ulcer. He is been tolerating the dressing changes without complication. Fortunately there does not appear to be any evidence of infection at this time. He is still just using triple antibiotic ointment along with clobetasol occasionally over the area. He never got the Santyl and really does not seem to intend to in my pinion. 06/01/18 on evaluation today patient appears to be doing a little better in regard to his left lateral lower extremity ulcer. He states that overall he does not feel like he is doing as well with the Dapsone as he did with the prednisone. Nonetheless he sees his dermatologist later today and is gonna talk to them about the possibility of going back on the prednisone. Overall again I believe that the wound would be better if you would utilize Santyl but he really does not seem to be interested in going back to the [place] at this point. He has been using triple antibiotic ointment. 06/15/18 on evaluation today patient's wound actually appears to be doing about the same at this point. Fortunately there is no signs of infection at this time. He has made slight improvements although he continues to not really want to clean the wound bed at this point. He states that he just doesn't mess with it he doesn't want to cause any problems with everything else he has going on. He has been on medication, antibiotics as  prescribed by his dermatologist, for a staff infection of his lower extremities which is really drying out now and looking much better he tells me. Fortunately there is no sign of overall infection. 06/29/18 on evaluation today patient appears to be doing well in regard to his left lateral lower surety ulcer all things considering. Fortunately his staff infection seems to be greatly improved compared to previous. He has no signs of infection and this is drying up quite nicely. He is still the doxycycline for this is no longer on cental, Dapsone, or any of the other medications. His dermatologist has recommended possibility of an infusion but right now he does not want to proceed with that. 07/13/18 on evaluation today patient appears to be doing about the same in regard to his left lateral lower surety ulcer. Fortunately there's no signs of infection at this time which is great news. Unfortunately he still builds up a significant amount of Slough/biofilm of the surface of the wound he still is not Luzader, Kraig ([id number]) really cleaning this as he should be appropriately. Again I'm able to easily with saline and gauze remove the majority of this on the surface which if you would do this at home would likely be a dramatic improvement for him as far as getting the area to improve. Nonetheless  overall I still feel like he is making progress is just very slow. I think Santyl will be of benefit for him as well. Still he has not gotten this as of this point. 07/27/18 on evaluation today patient actually appears to be doing little worse in regards of the erythema around the periwound region of the wound he also tells me that he's been having more drainage currently compared to what he was experiencing last time I saw him. He states not quite as bad as what he had because this was infected previously but nonetheless is still appears to be doing poorly. Fortunately there is no evidence of systemic infection at  this point. The patient tells me that he is not going to be able to afford the Santyl. He is still waiting to hear about the infusion therapy with his dermatologist. Apparently she wants an updated colonoscopy first. 08/10/18 on evaluation today patient appears to be doing better in regard to his left lateral lower extremity ulcer. Fortunately he is showing signs of improvement in this regard he's actually been approved for Remicade infusion's as well although this has not been scheduled as of yet. Fortunately there's no signs of active infection at this time in regard to the wound although he is having some issues with infection of the right lower extremity is been seen as dermatologist for this. Fortunately they are definitely still working with him trying to keep things under control. 09/07/18 on evaluation today patient is actually doing rather well in regard to his left lateral lower extremity ulcer. He notes these actually having some hair grow back on his extremity which is something he has not seen in years. He also tells me that the pain is really not giving them any trouble at this time which is also good news overall she is very pleased with the progress he's using a combination of the mupirocin along with the probate is all mixed. 09/21/18 on evaluation today patient actually appears to be doing fairly well all things considered in regard to his looks from the ulcer. He's been tolerating the dressing changes without complication. Fortunately there's no signs of active infection at this time which is good news he is still on all antibiotics or prevention of the staff infection. He has been on prednisone for time although he states it is gonna contact his dermatologist and see if she put them on a short course due to some irritation that he has going on currently. Fortunately there's no evidence of any overall worsening this is going very slow I think cental would be something that would be  helpful for him although he states that $50 for tube is quite expensive. He therefore is not willing to get that at this point. 10/06/18 on evaluation today patient actually appears to be doing decently well in regard to his left lateral leg ulcer. He's been tolerating the dressing changes without complication. Fortunately there's no signs of active infection at this time. Overall I'm actually rather pleased with the progress he's making although it's slow he doesn't show any signs of infection and he does seem to be making some improvement. I do believe that he may need a switch up and dressings to try to help this to heal more appropriately and quickly. 10/19/18 on evaluation today patient actually appears to be doing better in regard to his left lateral lower extremity ulcer. This is shown signs of having much less Slough buildup at this point due to the fact he  has been using the Santyl. Obviously this is very good news. The overall size of the wound is not dramatically smaller but again the appearance is. 11/02/18 on evaluation today patient actually appears to be doing quite well in regard to his lower [place] ulcer. A lot of the skin around the ulcer is actually somewhat irritating at this point this seems to be more due to the dressing causing irritation from the adhesive that anything else. Fortunately there is no signs of active infection at this time. 11/24/18 on evaluation today patient appears to be doing a little worse in regard to his overall appearance of his lower extremity ulcer. There's more erythema and warmth around the wound unfortunately. He is currently on doxycycline which he has been on for some time. With that being said I'm not sure that seems to be helping with what appears to possibly be an acute cellulitis with regard to his left lower extremity ulcer. No fevers, chills, nausea, or vomiting noted at this time. 12/08/18 on evaluation today patient's wounds actually appears to  be doing significantly better compared to his last evaluation. He has been using Santyl along with alternating tripling about appointment as well as the steroid cream seems to be doing quite well and the wound is showing signs of improvement which is excellent news. Fortunately there's no evidence of infection and in fact his culture came back negative with only normal skin flora noted. 12/21/2018 upon evaluation today patient actually appears to be doing excellent with regard to his ulcer. This is actually the best that I have seen it since have been helping to take care of him. It is both smaller as well as less slough noted on the surface of the wound and seems to be showing signs of good improvement with new skin growing from the edges. He has been using just the triamcinolone he does wonder if he can get a refill of that ointment today. 01/04/2019 upon evaluation today patient actually appears to be doing well with regard to his left lateral lower extremity ulcer. With that being said it does not appear to be that he is doing quite as well as last time as far as progression is concerned. There does not appear to be any signs of infection or significant irritation which is good news. With that being said I do believe that he may benefit from switching to a collagen based dressing based on how clean The wound appears. 01/18/2019 on evaluation today patient actually appears to be doing well with regard to his wound on the left lower extremity. He is not made a lot of progress compared to where we were previous but nonetheless does seem to be doing okay at this time which is good news. There is no signs of active infection which is also good news. My only concern currently is I do wish we can get him into utilizing the collagen dressing his insurance would not pay for the supplies that we ordered although it appears that he may be able to order this through his supply company that he typically utilizes.  This is [employer]. Nonetheless he did try to order it during the office visit today and it appears this did go through. We will see if he can get that it is a different brand but nonetheless he has collagen and I do think will be beneficial. 02/01/2019 on evaluation today patient actually appears to be doing a little worse today in regard to the overall size of his  wounds. Fortunately there is no signs of active infection at this time. That is visually. Nonetheless when this is happened before it was due to infection. For that reason were somewhat concerned about that this time as well. 02/08/2019 on evaluation today patient unfortunately appears to be doing slightly worse with regard to his wound upon evaluation today. Is measuring a Guimaraes, Sujay ([id number]) little deeper and a little larger unfortunately. I am not really sure exactly what is causing this to enlarge he actually did see his dermatologist she is going to see about initiating Humira for him. Subsequently she also did do steroid injections into the wound itself in the periphery. Nonetheless still nonetheless he seems to be getting a little bit larger he is gone back to just using the steroid cream topically which I think is appropriate. I would say hold off on the collagen for the time being is definitely a good thing to do. Based on the culture results which we finally did get the final result back regarding it shows staph as the bacteria noted again that can be a normal skin bacteria based on the fact however he is having increased drainage and worsening of the wound measurement wise I would go ahead and place him on an antibiotic today I do believe for this. 02/15/2019 on evaluation today patient actually appears to be doing somewhat better in regard to his ulcer. There is no signs of worsening at this time I did review his culture results which showed evidence of Staphylococcus aureus but not MRSA. Again this could just be more related  to the normal skin bacteria although he states the drainage has slowed down quite a bit he may have had a mild infection not just colonization. And was much smaller and then since around10/04/2019 on evaluation today patient appears to be doing unfortunately worse as far as the size of the wound. I really feel like that this is steadily getting larger again it had been doing excellent right at the beginning of September we have seen a steady increase in the area of the wound it is almost 2-1/2 times the size it was on September 1. Obviously this is a bad trend this is not wanting to see. For that reason we went back to using just the topical triamcinolone cream which does seem to help with inflammation. I checked him for bacteria by way of culture and nothing showed positive there. I am considering giving him a short course of a tapering steroid Dosepak today to see if that is can be beneficial for him. The patient is in agreement with giving that a try. 03/08/2019 on evaluation today patient appears to be doing very well in comparison to last evaluation with regard to his lower extremity ulcer. This is showing signs of less inflammation and actually measuring slightly smaller compared to last time every other week over the past month and a half he has been measuring larger larger larger. Nonetheless I do believe that the issue has been inflammation the prednisone does seem to have been beneficial for him which is good news. No fevers, chills, nausea, vomiting, or diarrhea. 03/22/2019 on evaluation today patient appears to be doing about the same with regard to his leg ulcer. He has been tolerating the dressing changes without complication. With that being said the wound seems to be mostly arrested at its current size but really is not making any progress except for when we prescribed the prednisone. He did show some signs of dropping  as far as the overall size of the wound during that interval week.  Nonetheless this is something he is not on long-term at this point and unfortunately I think he is getting need either this or else the Humira which his dermatologist has discussed try to get approval for. With that being said he will be seeing his dermatologist on the 11th of this month that is November. 04/19/2019 on evaluation today patient appears to be doing really about the same the wound is measuring slightly larger compared to last time I saw him. He has not been into the office since November 2 due to the fact that he unfortunately had Covid as that his entire family. He tells me that it was rough but they did pull-through and he seems to be doing much better. Fortunately there is no signs of active infection at this time. No fevers, chills, nausea, vomiting, or diarrhea. 05/10/2019 on evaluation today patient unfortunately appears to be doing significantly worse as compared to last time I saw him. He does tell me that he has had his first dose of Humira and actually is scheduled to get the next one in the upcoming week. With that being said he tells me also that in the past several days he has been having a lot of issues with green drainage she showed me a picture this is more blue-green in color. He is also been having issues with increased sloughy buildup and the wound does appear to be larger today. Obviously this is not the direction that we want everything to take based on the starting of his Humira. Nonetheless I think this is definitely a result of likely infection and to be honest I think this is probably Pseudomonas causing the infection based on what I am seeing. 05/24/2019 on evaluation today patient unfortunately appears to be doing significantly worse compared to his prior evaluation with me 2 weeks ago. I did review his culture results which showed that he does have Staph aureus as well as Pseudomonas noted on the culture. Nonetheless the Levaquin that I prescribed for him does  not appear to have been appropriate and in fact he tells me he is no longer experiencing the green drainage and discharge that he had at the last visit. Fortunately there is no signs of active infection at this time which is good news although the wound has significantly worsened it in fact is much deeper than it was previous. We have been utilizing up to this point triamcinolone ointment as the prescription topical of choice but at this time I really feel like that the wound is getting need to be packed in order to appropriately manage this due to the deeper nature of the wound. Therefore something along the lines of an alginate dressing may be more appropriate. 05/31/2019 upon inspection today patient's wound actually showed signs of doing poorly at this point. Unfortunately he just does not seem to be making any good progress despite what we have tried. He actually did go ahead and pick up the Cipro and start taking that as he was noticing more green drainage he had previously completed the Levaquin that I prescribed for him as well. Nonetheless he missed his appointment for the seventh last week on Wednesday with the wound care center and [facility] where his dermatologist referred him. Obviously I do think a second opinion would be helpful at this point especially in light of the fact that the patient seems to be doing so poorly  despite the fact that we have tried everything that I really know how at this point. The only thing that ever seems to have helped him in the past is when he was on high doses of continual steroids that did seem to make a difference for him. 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 06/07/2019 upon evaluation today patient unfortunately appears to be doing worse yet again with regard to his wound. In fact I am  starting to question whether or not he may have a fluid pocket in the muscle at this point based on the bulging and the soft appearance to the central portion of the muscle area. There is not anything draining from the muscle itself at this time which is good news but nonetheless the wound is expanding. I am not really seeing any results of the Humira as far as overall wound progression based on what I am seeing at this point. The patient has been referred for second opinion with regard to his wound to the [facility] wound care center by his dermatologist which I definitely am not in opposition to. Unfortunately we tried multiple dressings in the past including collagen, alginate, and at one point even Hydrofera Blue. With that being said he is never really used it for any significant amount of time due to the fact that he often complains of pain associated with these dressings and then will go back to either using the Santyl which she has done intermittently or more frequently the triamcinolone. He is also using his own compression stockings. We have wrapped him in the past but again that was something else that he really was not a big fan of. 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 He seemed to do much better when he was on the steroids although honestly I understand that the steroids are not necessarily the best medication to be on long-term obviously Dennin, Cobin ([id number]) 06/14/2019 on evaluation today patient appears to be doing actually a little bit better with regard to the overall appearance with his leg. Unfortunately he does continue to have issues with what appears to be  some fluid underneath the muscle although he did see the wound specialty center at [facility] last week their main goals were to see about infusion therapy in place of the Humira as they feel like that is not quite strong enough. They also recommended that we continue with the treatment otherwise as we are they felt like that was appropriate and they are okay with him continuing to follow-up here with [place] in that regard. With that being said they are also sending him to the vein specialist there to see about vein stripping and if that would be of benefit for him. Subsequently they also did not really address whether or not an ultrasound of the muscle area to see if there is anything that needs to be addressed here would be appropriate or not. For that reason I discussed this with  him last week I think we may proceed down that road at this point. 06/21/2019 upon evaluation today patient's wound actually appears to be doing slightly better compared to previous evaluations. I do believe that he has made a difference with regard to the progression here with the use of oral steroids. Again in the past has been the only thing that is really calm things down. He does tell me that from [facility] is gotten a good news from there that there are no further vein stripping that is necessary at this point. I do not have that available for review today although the patient did relay this to me. He also did obtain and have the ultrasound of the wound completed which I did sign off on today. It does appear that there is no fluid collection under the muscle this is likely then just edematous tissue in general. That is also good news. Overall I still believe the inflammation is the main issue here. He did inquire about the possibility of a wound VAC again with the muscle protruding like it is I am not really sure whether the wound VAC is necessarily ideal or not. That is something we will have to consider although I do believe he may  need compression wrapping to try to help with edema control which could potentially be of benefit. 06/28/2019 on evaluation today patient appears to be doing slightly better measurement wise although this is not terribly smaller he least seems to be trending towards that direction. With that being said he still seems to have purulent drainage noted in the wound bed at this time. He has been on Levaquin followed by Cipro over the past month. Unfortunately he still seems to have some issues with active infection at this time. I did perform a culture last week in order to evaluate and see if indeed there was still anything going on. Subsequently the culture did come back showing Pseudomonas which is consistent with the drainage has been having which is blue-green in color. He also has had an odor that again was somewhat consistent with Pseudomonas as well. Long story short it appears that the culture showed an intermediate finding with regard to how well the Cipro will work for the Pseudomonas infection. Subsequently being that he does not seem to be clearing up and at best what we are doing is just keeping this at [place] I think he may need to see infectious disease to discuss IV antibiotic options. 07/05/2019 upon evaluation today patient appears to be doing okay in regard to his leg ulcer. He has been tolerating the dressing changes at this point without complication. Fortunately there is no signs of active infection at this time which is good news. No fevers, chills, nausea, vomiting, or diarrhea. With that being said he does have an appointment with infectious disease tomorrow and his primary care on Wednesday. Again the reason for the infectious disease referral was due to the fact that he did not seem to be fully resolving with the use of oral antibiotics and therefore we were thinking that IV antibiotic therapy may be necessary secondary to the fact that there was an intermediate finding for how  effective the Cipro may be. Nonetheless again he has been having a lot of purulent and even green drainage. Fortunately right now that seems to have calmed down over the past week with the reinitiation of the oral antibiotic. Nonetheless we will see what Dr. Megan Salon has to say. 07/12/2019 upon evaluation today patient  appears to be doing about the same at this point in regard to his left lower extremity ulcer. Fortunately there is no signs of active infection at this time which is good news I do believe the Levaquin has been beneficial I did review Dr. Hale Bogus note and to be honest I agree that the patient's leg does appear to be doing better currently. What we found in the past as he does not seem to really completely resolve he will stop the antibiotic and then subsequently things will revert back to having issues with blue-green drainage, increased pain, and overall worsening in general. Obviously that is the reason I sent him back to infectious disease. Electronic Signature(s) Signed: 07/12/2019 8:59:21 AM By: Worthy Keeler PA-C Entered By: Worthy Keeler on 07/12/2019 08:59:21 KEYMON, MCELROY ([id number]) -------------------------------------------------------------------------------- Physical Exam Details Patient Name: [name] Date of Service: 07/12/2019 8:15 AM Medical Record Number: [id number] Patient Account Number: [id number] Date of Birth/Sex: [birth year] (39 y.o. M) Treating RN: [name] Primary Care Provider: [name] Other Clinician: Referring Provider: [name] Treating Provider/Extender: Melburn Hake, Suhayb Anzalone Weeks in Treatment: 38 Constitutional Well-nourished and well-hydrated in no acute distress. Respiratory normal breathing without difficulty. Psychiatric this patient is able to make decisions and demonstrates good insight into disease process. Alert and Oriented x 3. pleasant and cooperative. Notes Patient's wound in general seems to be showing  signs of maintaining at this point which is good news no sharp debridement was performed today. Electronic Signature(s) Signed: 07/12/2019 9:00:06 AM By: Worthy Keeler PA-C Entered By: Worthy Keeler on 07/12/2019 09:00:05 RICKI, CLACK ([id number]) -------------------------------------------------------------------------------- Physician Orders Details Patient Name: [name] Date of Service: 07/12/2019 8:15 AM Medical Record Number: [id number] Patient Account Number: [id number] Date of Birth/Sex: [birth year] (38 y.o. 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 Anesthetic (add to Medication List) Wound #1 Left,Lateral Lower Leg o Topical Lidocaine 4% cream applied to wound bed prior to debridement (In Clinic Only). Skin Barriers/Peri-Wound Care Wound #1 Left,Lateral Lower Leg o Triamcinolone Acetonide Ointment (TCA) Primary Wound Dressing o Other: - Sorbact Secondary Dressing Wound #1 Left,Lateral Lower Leg o XtraSorb Dressing Change Frequency Wound #1 Left,Lateral Lower Leg o Dressing is to be changed Monday and Thursday. Follow-up Appointments Wound #1 Left,Lateral Lower Leg o Return Appointment in 1 week. o Nurse Visit as needed Edema Control Wound #1 Left,Lateral Lower Leg o 3 Layer Compression System - Left Lower Extremity o Elevate legs to the level of the heart and pump ankles as often as possible Medications-please add to medication list. Wound #1 Left,Lateral  Lower Leg o P.O. Antibiotics - Continue antibiotics Patient Medications Allergies: milk, Biaxin, seasonal Notifications Medication Indication Start End JAYNE, Osa ([id number]) Notifications Medication Indication Start End Levaquin 07/15/2019 DOSE 1 - oral 750 mg tablet - 1 tablet oral taken 1 time a day for 10 days Electronic Signature(s) Signed: 07/15/2019 2:48:27 PM By: Worthy Keeler PA-C Previous Signature: 07/12/2019 5:29:33 PM Version By: Worthy Keeler PA-C Entered By: Worthy Keeler on 07/15/2019 14:48:24 Boutelle, [name] ([id number]) -------------------------------------------------------------------------------- Problem List Details Patient Name: [name] Date of Service: 07/12/2019  8:15 AM Medical Record Number: [id number] Patient Account Number: [id number] Date of Birth/Sex: [birth year] (39 y.o. 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 No Yes I87.2 Venous insufficiency (chronic) (peripheral) 12/04/2016 No Yes L03.116 Cellulitis of left lower limb 05/24/2019 No Yes Inactive Problems ICD-10 Code Description Active Date Inactive Date L97.213 Non-pressure chronic ulcer of right calf with necrosis of muscle 04/02/2017 04/02/2017 Resolved Problems Electronic Signature(s) Signed: 07/12/2019 5:29:33 PM By: Worthy Keeler PA-C Entered By: Worthy Keeler on 07/12/2019 08:19:45 Lizotte, [name] ([id number]) -------------------------------------------------------------------------------- Progress Note Details Patient Name: [name] Date of Service: 07/12/2019 8:15 AM Medical Record Number: [id number] Patient Account  Number: [id number] Date of Birth/Sex: [birth year] (39 y.o. M) Treating RN: [name] Primary Care Provider: [name] Other Clinician: Referring Provider: [name] Treating Provider/Extender: Melburn Hake, Astella Desir Weeks in Treatment: 135 Subjective Chief Complaint Information obtained from Patient He is here in follow up evaluation for LLE pyoderma ulcer History of Present Illness (HPI) 12/04/16; 40 year old man who comes into the clinic today for review of a wound on the posterior left calf. He tells me that is been there for about a year. He is not a diabetic he does smoke half a pack per day. He was seen in the ER on 11/20/16 felt to have cellulitis around the wound and was given clindamycin. An x-ray did not show osteomyelitis. The patient initially tells me that he has a milk allergy that sets off a pruritic itching rash on his lower legs which she scratches incessantly and he thinks that's what may have set up the wound. He has been using various topical antibiotics and ointments without any effect. He works in a [employer] and is on his feet all day. He does not have a prior history of wounds however he does have the rash on both lower legs the right arm and the ventral aspect of his left arm. These are excoriations and clearly have had scratching however there are of macular looking areas on both legs including a substantial larger area on the right leg. This does not have an underlying open area. There is no blistering. The patient tells me that 2 years ago in [place] in response to the rash on his legs he saw a dermatologist who told him he had a condition which may be pyoderma gangrenosum although I may be putting words into his mouth. He seemed to recognize this. 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 He is not in any treatment for this. He's had no recent travel 12/11/16; the patient arrives today with his wound and roughly the same  condition we've been using silver alginate this is a deep punched out wound with some surrounding erythema but no tenderness. Biopsy I did did not show confirmed pyoderma gangrenosum suggested nonspecific inflammation and vasculitis but does not provide an actual description of what was seen by the pathologist. I'm really not able to understand this We have also received information from the patient's dermatologist in [place] notes from April 2016. This was a doctor Agarwal-antal. 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 He was prescribed topical steroid high potency under occlusion which helped but at this point the patient  did not have a deep punched out wound. 12/18/16; the patient's wound is larger in terms of surface area however this surface looks better and there is less depth. The surrounding erythema also is better. The patient states that the wrap we put on came off 2 days ago when he has been using his compression stockings. He we are in the process of getting a dermatology consult. 12/26/16 on evaluation today patient's left lower extremity wound shows evidence of infection with surrounding erythema noted. He has been tolerating the dressing changes but states that he has noted more discomfort. There is a larger area of erythema surrounding the wound. No fevers, chills, nausea, or vomiting noted at this time. With that being said the wound still does have slough covering the surface. He is not allergic to any medication that he is aware of at this point. In regard to his right lower extremity he had several regions that are erythematous and pruritic he wonders if there's anything we can do to help that. 01/02/17 I reviewed patient's wound culture which was obtained his visit last week. He was placed on doxycycline at that point. Unfortunately that does not appear to be an antibiotic that would likely help with the situation however the pseudomonas noted on culture is sensitive to  Cipro. Also unfortunately patient's wound seems to have a large compared to last week's evaluation. Not severely so but there are definitely increased measurements in general. He is continuing to have discomfort as well he writes this to be a seven out of 10. In fact he would prefer me not to perform any debridement today due to the fact that he is having discomfort and considering he has an active infection on the little reluctant to do so anyway. No fevers, chills, nausea, or vomiting noted at this time. 01/08/17; patient seems dermatology on September 5. I suspect dermatology will want the slides from the biopsy I did sent to their pathologist. I'm not sure if there is a way we can expedite that. In any case the culture I did before I left on vacation 3 weeks ago showed Pseudomonas he was given 10 days of Cipro and per her description of her intake nurses is actually somewhat better this week although the wound is quite a bit bigger than I remember the last time I saw this. He still has 3 more days of Cipro 01/21/17; dermatology appointment tomorrow. He has completed the ciprofloxacin for Pseudomonas. Surface of the wound looks better however he is had some deterioration in the lesions on his right leg. Meantime the left lateral leg wound we will continue with sample 01/29/17; patient had his dermatology appointment but I can't yet see that note. He is completed his antibiotics. The wound is more superficial but considerably larger in circumferential area than when he came in. This is in his left lateral calf. He also has swollen erythematous areas with superficial wounds on the right leg and small papular areas on both arms. There apparently areas in her his upper thighs and buttocks I did not look at those. Dermatology biopsied the right leg. Hopefully will have their input next week. 02/05/17; patient went back to see his dermatologist who told him that he had a "scratching problem" as well as staph.  He is now on a 30 day course of doxycycline and I believe she gave him triamcinolone cream to the right leg areas to help with the itching [not exactly sure but probably triamcinolone]. She apparently looked at  the left lateral leg wound although this was not rebiopsied and I think felt to be ultimately part of the same pathogenesis. He is using sample border foam and changing nevus himself. He now has a new open area on the right posterior leg which was his biopsy site I don't have any of the dermatology notes UZZIEL, RUSSEY ([id number]) 02/12/17; we put the patient in compression last week with SANTYL to the wound on the left leg and the biopsy. Edema is much better and the depth of the wound is now at level of skin. Area is still the same Biopsy site on the right lateral leg we've also been using santyl with a border foam dressing and he is changing this himself. 02/19/17; Using silver alginate started last week to both the substantial left leg wound and the biopsy site on the right wound. He is tolerating compression well. Has a an appointment with his primary M.D. tomorrow wondering about diuretics although I'm wondering if the edema problem is actually lymphedema 02/26/17; the patient has been to see his primary doctor Dr. Jerrel Ivory at [place] our primary care. She started him on Lasix 20 mg and this seems to have helped with the edema. However we are not making substantial change with the left lateral calf wound and inflammation. The biopsy site on the right leg also looks stable but not really all that different. 03/12/17; the patient has been to see vein and vascular Dr. Lucky Cowboy. He has had venous reflux studies I have not reviewed these. I did get a call from his dermatology office. They felt that he might have pathergy based on their biopsy on his right leg which led them to look at the slides of the biopsy I did on the left leg and they wonder whether this represents pyoderma gangrenosum  which was the original supposition in a man with ulcerative colitis albeit inactive for many years. They therefore recommended clobetasol and tetracycline i.e. 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 03/26/17; apparently the patient just had reflux studies not an appointment with Dr. dew. She arrives in clinic today having applied clobetasol for 2-3 weeks. He notes over the last 2-3 days excessive drainage having to change the dressing 3-4 times a day and also expanding erythema. He states the expanding erythema seems to come and go and was last this red was earlier in the month.he is on doxycycline 150 mg twice a day as an anti- inflammatory systemic therapy for possible pyoderma gangrenosum along with the topical clobetasol 04/02/17; the patient was seen last week by Dr. Lillia Carmel at [facility] dermatology locally who kindly saw him at my request. 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 My concern was the degree of erythema medially extending from his left leg wound which was either inflammation from pyoderma or cellulitis. I put him on Augmentin however culture of the wound showed Pseudomonas which is quinolone sensitive. I really don't believe he has cellulitis however in view of everything I will continue and give him a course of Cipro. He is also on doxycycline as an immune modulator for the pyoderma. In addition to his original wound on the left lateral leg with surrounding erythema he has a wound on the right posterior calf which was an original biopsy site done by dermatology. 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 Saw Dr. Lillia Carmel yesterday. He has been using topical antibiotics to both wound areas his original  wound on the left and the biopsies/pathergy area on the right. There is definitely some improvement in the inflammation around the wound on the right  although the patient states he has increasing sensitivity of the wounds. He is on prednisone 60 and doxycycline 1 as prescribed by Dr. Lillia Carmel. He is covering the topical antibiotic with gauze and putting this in his own compression stocks and changing this daily. He states that Dr. Lottie Rater did a culture of the left leg wound yesterday 05/07/17; pyoderma gangrenosum. The patient saw Dr. Lillia Carmel yesterday and has a follow-up with her in one month. He is still using topical antibiotics to both wounds although he can't recall exactly what type. He is still on prednisone 60 mg. Dr. Lillia Carmel stated that the doxycycline could stop if we were in agreement. 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 The right medial leg was induced by biopsy/pathergy. The area on the right is essentially healed. 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 The right medial leg has closed and remains closed. He is still on prednisone 60. He tells me he missed his last dermatology appointment with Dr. Lillia Carmel but will make another appointment. He reports that her blood sugar at a recent screen in [place] was high 200's. He was 180 today. 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 I think he is going to require still much longer prednisone perhaps another 3 months before attempting to taper. In the meantime his wound is a lot better. Smaller. He is cleaning this off daily and applying topical antibiotics. When he was last in the clinic I thought about changing to [facility] and actually put in a couple of calls to dermatology although probably not during their business hours. In any case the wound looks better smaller I don't think there is any need to change what he is doing 08/06/17-he is here in follow up evaluation  for pyoderma left leg ulcer. He continues on oral prednisone. He has been using triple antibiotic ointment. There is surface debris and we will transition to [facility] and have him return in 2 weeks. He has lost 30 pounds since his last appointment with lifestyle modification. He may benefit from topical steroid cream for treatment this can be considered at a later date. 08/22/17 on evaluation today patient appears to actually be doing rather well in regard to his left lateral lower extremity ulcer. He has actually been managed by Dr. Dellia Nims most recently. Patient is currently on oral steroids at this time. This seems to have been of benefit for him. Nonetheless his last visit was actually with Leah on 08/06/17. Currently he is not utilizing any topical steroid creams although this could be of benefit as well. No fevers, chills, nausea, or vomiting noted at this time. 09/05/17 on evaluation today patient appears to be doing better in regard to his left lateral lower extremity ulcer. He has been tolerating the dressing changes without complication. He is using Santyl with good effect. Overall I'm very pleased with how things are standing at this point. Patient likewise is happy that this is doing better. 09/19/17 on evaluation today patient actually appears to be doing rather well in regard to his left lateral lower extremity ulcer. Again this is secondary to Pyoderma gangrenosum and he seems to be progressing well with the [facility]  which is good news. He's not having any significant pain. 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 He has been tolerating the Santyl without complication and in general I feel like he's making good progress. 10/17/17 on evaluation today patient appears to be doing very well in regard to his left lateral lower surety ulcer. He has been tolerating the dressing changes without  complication. There does not appear to be any evidence of infection he's alternating the Santyl and the triple antibiotic ointment every other day this seems to be doing well for him. 11/03/17 on evaluation today patient appears to be doing very well in regard to his left lateral lower extremity ulcer. He is been tolerating the dressing changes without complication which is good news. Fortunately there does not appear to be any evidence of infection which is also great news. Overall is doing excellent they are starting to taper down on the prednisone is down to 40 mg at this point it also started topical clobetasol for him. 11/17/17 on evaluation today patient appears to be doing well in regard to his left lateral lower surety ulcer. He's been tolerating the dressing changes without complication. He does note that he is having no pain, no excessive drainage or discharge, and overall he feels like things are going about how he would expect and hope they would. Overall he seems to have no evidence of infection at this time in my opinion which is good news. COURTENAY, CREGER ([id number]) 12/04/17-He is seen in follow-up evaluation for right lateral lower extremity ulcer. He has been applying topical steroid cream. Today's measurement show slight increase in size. Over the next 2 weeks we will transition to every other day Santyl and steroid cream. He has been encouraged to monitor for changes and notify clinic with any concerns 12/15/17 on evaluation today patient's left lateral motion the ulcer and fortunately is doing worse again at this point. This just since last week to this week has close to doubled in size according to the patient. I did not seeing last week's I do not have a visual to compare this to in our system was also down so we do not have all the charts and at this point. Nonetheless it does have me somewhat concerned in regard to the fact that again he was worried enough about it he has contact the  dermatology that placed them back on the full strength, 50 mg a day of the prednisone that he was taken previous. He continues to alternate using clobetasol along with Santyl at this point. He is obviously somewhat frustrated. 12/22/17 on evaluation today patient appears to be doing a little worse compared to last evaluation. Unfortunately the wound is a little deeper and slightly larger than the last week's evaluation. With that being said he has made some progress in regard to the irritation surrounding at this time unfortunately despite that progress that's been made he still has a significant issue going on here. 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 For that reason I discussed with him today the possibility of perform a wound culture to ensure there's nothing overtly infected. 01/06/18 on evaluation today patient's wound is larger and deeper than previously evaluated. With that being said it did appear that his wound was infected  after my last evaluation with him. Subsequently I did end up prescribing a prescription for Bactrim DS which she has been taking and having no complication with. Fortunately there does not appear to be any evidence of infection at this point in time as far as anything spreading, no want to touch, and overall I feel like things are showing signs of improvement. 01/13/18 on evaluation today patient appears to be even a little larger and deeper than last time. There still muscle exposed in the base of the wound. Nonetheless he does appear to be less erythematous I do believe inflammation is calming down also believe the infection looks like it's probably resolved at this time based on what I'm seeing. No fevers, chills, nausea, or vomiting noted at this time. 01/30/18 on evaluation today patient actually appears to visually  look better for the most part. Unfortunately those visually this looks better he does seem to potentially have what may be an abscess in the muscle that has been noted in the central portion of the wound. This is the first time that I have noted what appears to be fluctuance in the central portion of the muscle. With that being said I'm somewhat more concerned about the fact that this might indicate an abscess formation at this location. I do believe that an ultrasound would be appropriate. This is likely something we need to try to do as soon as possible. He has been switch to mupirocin ointment and he is no longer using the steroid ointment as prescribed by dermatology he sees them again next week he's been decreased from 60 to 40 mg of prednisone. 03/09/18 on evaluation today patient actually appears to be doing a little better compared to last time I saw him. There's not as much erythema surrounding the wound itself. He I did review his most recent infectious disease note which was dated 02/24/18. He saw Dr. Michel Bickers in [place]. 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 Patient is now off of antibiotics and they are continually observing this. There seems to be no change in the past two weeks in my pinion based on what the patient says and what I see today compared to what Dr. Megan Salon likely saw two weeks ago. No fevers, chills, nausea, or vomiting noted at this time. 03/23/18 on evaluation today patient's wound actually appears to be showing signs of improvement which is good news. He is currently still on the Dapsone. He is also working on tapering the prednisone to get off of this and Dr. Lottie Rater is working with him in this regard. Nonetheless overall I feel like the wound is doing well it does appear based on the infectious disease note that I reviewed from Dr. 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 I did also review the results of his ultrasound of left lower extremity which revealed there was a dentist tissue in the base of the wound without an abscess noted. 04/06/18 on evaluation today the patient's left lateral lower extremity ulcer actually appears to be doing fairly well which is excellent news. There does not appear to be any evidence of infection at this time which is also great news. Overall he still does have a significantly large ulceration although little by little he seems to be  making progress. He is down to 10 mg a day of the prednisone. 04/20/18 on evaluation today patient actually appears to be doing excellent at this time in regard to his left lower extremity ulcer. He's making signs of good progress unfortunately this is taking much longer than we would really like to see but nonetheless he is making progress. Fortunately there does not appear to be any evidence of infection at this time. No fevers, chills, nausea, or vomiting noted at this time. The patient has not been using the Santyl due to the cost he hadn't got in this field yet. He's mainly been using the antibiotic ointment topically. Subsequently he also tells me that he really has not been scrubbing in the shower I think this would be helpful again as I told him it doesn't have to be anything too aggressive to even make it believe just enough to keep it free of some of the loose slough and biofilm on the wound surface. 05/11/18 on evaluation today patient's wound appears to be making slow but sure progress in regard to the left lateral lower extremity ulcer. He is been tolerating the dressing changes without complication. Fortunately there does not appear to be any evidence of infection at this time. He is still just using triple antibiotic ointment along with clobetasol occasionally over the area.  He never got the Santyl and really does not seem to intend to in my pinion. 06/01/18 on evaluation today patient appears to be doing a little better in regard to his left lateral lower extremity ulcer. He states that overall he does not feel like he is doing as well with the Dapsone as he did with the prednisone. Nonetheless he sees his dermatologist later today and is gonna talk to them about the possibility of going back on the prednisone. Overall again I believe that the wound would be better if you would utilize Santyl but he really does not seem to be interested in going back to the [place] at this point. He has been using triple antibiotic ointment. 06/15/18 on evaluation today patient's wound actually appears to be doing about the same at this point. Fortunately there is no signs of infection at this time. He has made slight improvements although he continues to not really want to clean the wound bed at this point. He states that he just doesn't mess with it he doesn't want to cause any problems with everything else he has going on. He has been on medication, antibiotics as prescribed by his dermatologist, for a staff infection of his lower extremities which is really drying out now and looking much better he tells me. Fortunately there is no sign of overall infection. 06/29/18 on evaluation today patient appears to be doing well in regard to his left lateral lower surety ulcer all things considering. Fortunately his staff infection seems to be greatly improved compared to previous. He has no signs of infection and this is drying up quite nicely. He is still the doxycycline Legault, Lionell ([id number]) for this is no longer on cental, Dapsone, or any of the other medications. His dermatologist has recommended possibility of an infusion but right now he does not want to proceed with that. 07/13/18 on evaluation today patient appears to be doing about the same in regard to his left lateral lower surety  ulcer. Fortunately there's no signs of infection at this time which is great news. Unfortunately he still builds up a significant amount of Slough/biofilm of the surface of the  wound he still is not really cleaning this as he should be appropriately. Again I'm able to easily with saline and gauze remove the majority of this on the surface which if you would do this at home would likely be a dramatic improvement for him as far as getting the area to improve. Nonetheless overall I still feel like he is making progress is just very slow. I think Santyl will be of benefit for him as well. Still he has not gotten this as of this point. 07/27/18 on evaluation today patient actually appears to be doing little worse in regards of the erythema around the periwound region of the wound he also tells me that he's been having more drainage currently compared to what he was experiencing last time I saw him. He states not quite as bad as what he had because this was infected previously but nonetheless is still appears to be doing poorly. Fortunately there is no evidence of systemic infection at this point. The patient tells me that he is not going to be able to afford the Santyl. He is still waiting to hear about the infusion therapy with his dermatologist. Apparently she wants an updated colonoscopy first. 08/10/18 on evaluation today patient appears to be doing better in regard to his left lateral lower extremity ulcer. Fortunately he is showing signs of improvement in this regard he's actually been approved for Remicade infusion's as well although this has not been scheduled as of yet. Fortunately there's no signs of active infection at this time in regard to the wound although he is having some issues with infection of the right lower extremity is been seen as dermatologist for this. Fortunately they are definitely still working with him trying to keep things under control. 09/07/18 on evaluation today patient is  actually doing rather well in regard to his left lateral lower extremity ulcer. He notes these actually having some hair grow back on his extremity which is something he has not seen in years. He also tells me that the pain is really not giving them any trouble at this time which is also good news overall she is very pleased with the progress he's using a combination of the mupirocin along with the probate is all mixed. 09/21/18 on evaluation today patient actually appears to be doing fairly well all things considered in regard to his looks from the ulcer. He's been tolerating the dressing changes without complication. Fortunately there's no signs of active infection at this time which is good news he is still on all antibiotics or prevention of the staff infection. He has been on prednisone for time although he states it is gonna contact his dermatologist and see if she put them on a short course due to some irritation that he has going on currently. Fortunately there's no evidence of any overall worsening this is going very slow I think cental would be something that would be helpful for him although he states that $50 for tube is quite expensive. He therefore is not willing to get that at this point. 10/06/18 on evaluation today patient actually appears to be doing decently well in regard to his left lateral leg ulcer. He's been tolerating the dressing changes without complication. Fortunately there's no signs of active infection at this time. Overall I'm actually rather pleased with the progress he's making although it's slow he doesn't show any signs of infection and he does seem to be making some improvement. I do believe that he may need  a switch up and dressings to try to help this to heal more appropriately and quickly. 10/19/18 on evaluation today patient actually appears to be doing better in regard to his left lateral lower extremity ulcer. This is shown signs of having much less Slough buildup  at this point due to the fact he has been using the [employer]. Obviously this is very good news. The overall size of the wound is not dramatically smaller but again the appearance is. 11/02/18 on evaluation today patient actually appears to be doing quite well in regard to his lower [place] ulcer. A lot of the skin around the ulcer is actually somewhat irritating at this point this seems to be more due to the dressing causing irritation from the adhesive that anything else. Fortunately there is no signs of active infection at this time. 11/24/18 on evaluation today patient appears to be doing a little worse in regard to his overall appearance of his lower extremity ulcer. There's more erythema and warmth around the wound unfortunately. He is currently on doxycycline which he has been on for some time. With that being said I'm not sure that seems to be helping with what appears to possibly be an acute cellulitis with regard to his left lower extremity ulcer. No fevers, chills, nausea, or vomiting noted at this time. 12/08/18 on evaluation today patient's wounds actually appears to be doing significantly better compared to his last evaluation. He has been using Santyl along with alternating tripling about appointment as well as the steroid cream seems to be doing quite well and the wound is showing signs of improvement which is excellent news. Fortunately there's no evidence of infection and in fact his culture came back negative with only normal skin flora noted. 12/21/2018 upon evaluation today patient actually appears to be doing excellent with regard to his ulcer. This is actually the best that I have seen it since have been helping to take care of him. It is both smaller as well as less slough noted on the surface of the wound and seems to be showing signs of good improvement with new skin growing from the edges. He has been using just the triamcinolone he does wonder if he can get a refill of  that ointment today. 01/04/2019 upon evaluation today patient actually appears to be doing well with regard to his left lateral lower extremity ulcer. With that being said it does not appear to be that he is doing quite as well as last time as far as progression is concerned. There does not appear to be any signs of infection or significant irritation which is good news. With that being said I do believe that he may benefit from switching to a collagen based dressing based on how clean The wound appears. 01/18/2019 on evaluation today patient actually appears to be doing well with regard to his wound on the left lower extremity. He is not made a lot of progress compared to where we were previous but nonetheless does seem to be doing okay at this time which is good news. There is no signs of active infection which is also good news. My only concern currently is I do wish we can get him into utilizing the collagen dressing his insurance would not pay for the supplies that we ordered although it appears that he may be able to order this through his supply company that he typically utilizes. This is [employer]. Nonetheless he did try to order it during the office visit today  and it appears this did go through. We will see if he can get that it is a different brand but nonetheless he has collagen and I do think will be beneficial. MALAKIE, BALIS ([id number]) 02/01/2019 on evaluation today patient actually appears to be doing a little worse today in regard to the overall size of his wounds. Fortunately there is no signs of active infection at this time. That is visually. Nonetheless when this is happened before it was due to infection. For that reason were somewhat concerned about that this time as well. 02/08/2019 on evaluation today patient unfortunately appears to be doing slightly worse with regard to his wound upon evaluation today. Is measuring a little deeper and a little larger unfortunately. I am not  really sure exactly what is causing this to enlarge he actually did see his dermatologist she is going to see about initiating Humira for him. Subsequently she also did do steroid injections into the wound itself in the periphery. Nonetheless still nonetheless he seems to be getting a little bit larger he is gone back to just using the steroid cream topically which I think is appropriate. I would say hold off on the collagen for the time being is definitely a good thing to do. Based on the culture results which we finally did get the final result back regarding it shows staph as the bacteria noted again that can be a normal skin bacteria based on the fact however he is having increased drainage and worsening of the wound measurement wise I would go ahead and place him on an antibiotic today I do believe for this. 02/15/2019 on evaluation today patient actually appears to be doing somewhat better in regard to his ulcer. There is no signs of worsening at this time I did review his culture results which showed evidence of Staphylococcus aureus but not MRSA. Again this could just be more related to the normal skin bacteria although he states the drainage has slowed down quite a bit he may have had a mild infection not just colonization. And was much smaller and then since around10/04/2019 on evaluation today patient appears to be doing unfortunately worse as far as the size of the wound. I really feel like that this is steadily getting larger again it had been doing excellent right at the beginning of September we have seen a steady increase in the area of the wound it is almost 2-1/2 times the size it was on September 1. Obviously this is a bad trend this is not wanting to see. For that reason we went back to using just the topical triamcinolone cream which does seem to help with inflammation. I checked him for bacteria by way of culture and nothing showed positive there. I am considering giving him a short  course of a tapering steroid Dosepak today to see if that is can be beneficial for him. The patient is in agreement with giving that a try. 03/08/2019 on evaluation today patient appears to be doing very well in comparison to last evaluation with regard to his lower extremity ulcer. This is showing signs of less inflammation and actually measuring slightly smaller compared to last time every other week over the past month and a half he has been measuring larger larger larger. Nonetheless I do believe that the issue has been inflammation the prednisone does seem to have been beneficial for him which is good news. No fevers, chills, nausea, vomiting, or diarrhea. 03/22/2019 on evaluation today patient appears to be  doing about the same with regard to his leg ulcer. He has been tolerating the dressing changes without complication. With that being said the wound seems to be mostly arrested at its current size but really is not making any progress except for when we prescribed the prednisone. He did show some signs of dropping as far as the overall size of the wound during that interval week. Nonetheless this is something he is not on long-term at this point and unfortunately I think he is getting need either this or else the Humira which his dermatologist has discussed try to get approval for. With that being said he will be seeing his dermatologist on the 11th of this month that is November. 04/19/2019 on evaluation today patient appears to be doing really about the same the wound is measuring slightly larger compared to last time I saw him. He has not been into the office since November 2 due to the fact that he unfortunately had Covid as that his entire family. He tells me that it was rough but they did pull-through and he seems to be doing much better. Fortunately there is no signs of active infection at this time. No fevers, chills, nausea, vomiting, or diarrhea. 05/10/2019 on evaluation today patient  unfortunately appears to be doing significantly worse as compared to last time I saw him. He does tell me that he has had his first dose of Humira and actually is scheduled to get the next one in the upcoming week. With that being said he tells me also that in the past several days he has been having a lot of issues with green drainage she showed me a picture this is more blue-green in color. He is also been having issues with increased sloughy buildup and the wound does appear to be larger today. Obviously this is not the direction that we want everything to take based on the starting of his Humira. Nonetheless I think this is definitely a result of likely infection and to be honest I think this is probably Pseudomonas causing the infection based on what I am seeing. 05/24/2019 on evaluation today patient unfortunately appears to be doing significantly worse compared to his prior evaluation with me 2 weeks ago. I did review his culture results which showed that he does have Staph aureus as well as Pseudomonas noted on the culture. Nonetheless the Levaquin that I prescribed for him does not appear to have been appropriate and in fact he tells me he is no longer experiencing the green drainage and discharge that he had at the last visit. Fortunately there is no signs of active infection at this time which is good news although the wound has significantly worsened it in fact is much deeper than it was previous. We have been utilizing up to this point triamcinolone ointment as the prescription topical of choice but at this time I really feel like that the wound is getting need to be packed in order to appropriately manage this due to the deeper nature of the wound. Therefore something along the lines of an alginate dressing may be more appropriate. 05/31/2019 upon inspection today patient's wound actually showed signs of doing poorly at this point. Unfortunately he just does not seem to be making any good  progress despite what we have tried. He actually did go ahead and pick up the Cipro and start taking that as he was noticing more green drainage he had previously completed the Levaquin that I prescribed for him as  well. Nonetheless he missed his appointment for the seventh last week on Wednesday with the wound care center and [facility] where his dermatologist referred him. Obviously I do think a second opinion would be helpful at this point especially in light of the fact that the patient seems to be doing so poorly despite the fact that we have tried everything that I really know how at this point. The only thing that ever seems to have helped him in the past is when he was on high doses of continual steroids that did seem to make a difference for him. 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 06/07/2019 upon evaluation today patient unfortunately appears to be doing worse yet again with regard to his wound. In fact I am starting to question whether or not he may have a fluid pocket in the muscle at this point based on the bulging and the soft appearance to the central portion of the muscle area. There is not anything draining from the muscle itself at this time which is good news but nonetheless the wound is expanding. I am not really seeing any results of the Humira as far as overall wound progression based on what I am seeing at this point. The patient has been referred for second opinion with regard to his wound to the [facility] wound care center by his dermatologist which I definitely am not in opposition to. Unfortunately we tried multiple dressings in the past including collagen, alginate, and at one point even Hydrofera Blue. With that being said he is never really used it for any significant amount of time due to the fact that he often  complains of pain associated with these dressings and then will go back to either using the Santyl which she has done intermittently or more frequently the triamcinolone. He is also using his own compression stockings. We have wrapped him in the past but again that was something else that he really was not a big fan of. 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 He seemed to do much better when he was on the steroids although honestly I understand that the steroids are not necessarily the best medication to be on long-term obviously 06/14/2019 on evaluation today patient appears to be doing actually a little bit better with regard to the overall appearance with his leg. Unfortunately he does continue to have issues with what appears to be some fluid underneath the muscle although he did see the wound specialty center at [facility] last week their main goals were to see about infusion therapy in place of the Humira as they feel like that is not quite strong enough. They also recommended that we continue with the treatment otherwise as we are they felt like that was appropriate and they are okay with him continuing to follow-up here with [place] in that regard. With that being said they are also sending him to the vein specialist there  to see about vein stripping and if that would be of benefit for him. Subsequently they also did not really address whether or not an ultrasound of the muscle area to see if there is anything that needs to be addressed here would be appropriate or not. For that reason I discussed this with him last week I think we may proceed down that road  at this point. 06/21/2019 upon evaluation today patient's wound actually appears to be doing slightly better compared to previous evaluations. I do believe that he has made a difference with regard to the progression here with the use of oral steroids. Again in the past has been the only thing that is really calm things down. He does tell me that from [facility] is gotten a good news from there that there are no further vein stripping that is necessary at this point. I do not have that available for review today although the patient did relay this to me. He also did obtain and have the ultrasound of the wound completed which I did sign off on today. It does appear that there is no fluid collection under the muscle this is likely then just edematous tissue in general. That is also good news. Overall I still believe the inflammation is the main issue here. He did inquire about the possibility of a wound VAC again with the muscle protruding like it is I am not really sure whether the wound VAC is necessarily ideal or not. That is something we will have to consider although I do believe he may need compression wrapping to try to help with edema control which could potentially be of benefit. 06/28/2019 on evaluation today patient appears to be doing slightly better measurement wise although this is not terribly smaller he least seems to be trending towards that direction. With that being said he still seems to have purulent drainage noted in the wound bed at this time. He has been on Levaquin followed by Cipro over the past month. Unfortunately he still seems to have some issues with active infection at this time. I did perform a culture last week in order to evaluate and see if indeed there was still anything going on. Subsequently the culture did come back showing Pseudomonas which is consistent with the drainage has been having which is blue-green in color. He also has had an odor that again was somewhat consistent  with Pseudomonas as well. Long story short it appears that the culture showed an intermediate finding with regard to how well the Cipro will work for the Pseudomonas infection. Subsequently being that he does not seem to be clearing up and at best what we are doing is just keeping this at [place] I think he may need to see infectious disease to discuss IV antibiotic options. 07/05/2019 upon evaluation today patient appears to be doing okay in regard to his leg ulcer. He has been tolerating the dressing changes at this point without complication. Fortunately there is no signs of active infection at this time which is good news. No fevers, chills, nausea, vomiting, or diarrhea. With that being said he does have an appointment with infectious disease tomorrow and his primary care on Wednesday. Again the reason for the infectious disease referral was due to the fact that he did not seem to be fully resolving with the use of oral antibiotics and therefore we were thinking that IV antibiotic therapy may be necessary secondary to the fact that there was an intermediate finding for how  effective the Cipro may be. Nonetheless again he has been having a lot of purulent and even green drainage. Fortunately right now that seems to have calmed down over the past week with the reinitiation of the oral antibiotic. Nonetheless we will see what Dr. Megan Salon has to say. 07/12/2019 upon evaluation today patient appears to be doing about the same at this point in regard to his left lower extremity ulcer. Fortunately there is no signs of active infection at this time which is good news I do believe the Levaquin has been beneficial I did review Dr. Hale Bogus note and to be honest I agree that the patient's leg does appear to be doing better currently. What we found in the past as he does not seem to really completely resolve he will stop the antibiotic and then subsequently things will revert back to having issues with blue-green  drainage, increased pain, and overall worsening in general. Obviously that is the reason I sent him back to infectious disease. Objective Constitutional Well-nourished and well-hydrated in no acute distress. Vitals Time Taken: 8:30 AM, Height: 71 in, Weight: 338 lbs, BMI: 47.1, Temperature: 98.4 F, Pulse: 84 bpm, Respiratory Rate: 18 breaths/min, Blood Pressure: 126/74 mmHg. Respiratory normal breathing without difficulty. Psychiatric this patient is able to make decisions and demonstrates good insight into disease process. Alert and Oriented x 3. pleasant and cooperative. General Notes: Patient's wound in general seems to be showing signs of maintaining at this point which is good news no sharp debridement was performed today. AYUSH, BOULET ([id number]) Integumentary (Hair, Skin) Wound #1 status is Open. Original cause of wound was Gradually Appeared. The wound is located on the Left,Lateral Lower Leg. The wound measures 5.7cm length x 6cm width x 0.6cm depth; 26.861cm^2 area and 16.116cm^3 volume. There is muscle and Fat Layer (Subcutaneous Tissue) Exposed exposed. There is no tunneling or undermining noted. There is a medium amount of serous drainage noted. The wound margin is distinct with the outline attached to the wound base. There is medium (34-66%) pink granulation within the wound bed. There is a medium (34-66%) amount of necrotic tissue within the wound bed including Adherent Slough. 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 There was a Three Layer Compression Therapy Procedure with a pre-treatment ABI of 1.2 by [name], RN. Post procedure Diagnosis Wound #1: Same as Pre-Procedure Plan Wound Cleansing: Wound #1  Left,Lateral Lower Leg: Clean wound with Normal Saline. Anesthetic (add to Medication List): Wound #1 Left,Lateral Lower Leg: Topical Lidocaine 4% cream applied to wound bed prior to debridement (In Clinic Only). Skin Barriers/Peri-Wound Care: Wound #1 Left,Lateral Lower Leg: Triamcinolone Acetonide Ointment (TCA) Primary Wound Dressing: Other: - Sorbact Secondary Dressing: Wound #1 Left,Lateral Lower Leg: XtraSorb Dressing Change Frequency: Wound #1 Left,Lateral Lower Leg: Dressing is to be changed Monday and Thursday. Follow-up Appointments: Wound #1 Left,Lateral Lower Leg: Return Appointment in 1 week. Nurse Visit as needed Edema Control: Wound #1 Left,Lateral Lower Leg: 3 Layer Compression System - Left Lower Extremity Elevate legs to the level of the heart and pump ankles as often as possible Medications-please add to medication list.: Wound #1 Left,Lateral Lower Leg: P.O. Antibiotics - Continue antibiotics Bahar, Jassiel ([id number]) The following medication(s) was prescribed: Levaquin oral 750 mg tablet 1 1 tablet oral taken 1 time a  day for 10 days starting 07/15/2019 1. At this point I am going to go ahead and see about initiating treatment with a compression wrap we will use Sorbact underneath this will still use a thin layer of triamcinolone as well. 2. I am also going to suggest the patient continue and complete his current antibiotic regimen when he is done with that he will discontinue and then of course we will see with Dr. Megan Salon thinks about how the wound appears following when the patient is seen back 2 weeks from his prior visit which was 07/06/2019. 3. I do recommend the patient continue to elevate his legs as much as possible as well obviously the more he can do this the better off he will be from the standpoint of swelling we will bring him back towards the end of the week on Thursday in order to rewrap him just so that he will not have any sliding down of the  dressing. We will see patient back for reevaluation in 1 week here in the clinic. If anything worsens or changes patient will contact our office for additional recommendations. Electronic Signature(s) Signed: 07/15/2019 2:48:37 PM By: Worthy Keeler PA-C Previous Signature: 07/12/2019 9:09:07 AM Version By: Worthy Keeler PA-C Entered By: Worthy Keeler on 07/15/2019 14:48:37 Meddings, Rickardo ([id number]) -------------------------------------------------------------------------------- SuperBill Details Patient Name: [name] Date of Service: 07/12/2019 Medical Record Number: [id number] Patient Account Number: [id number] Date of Birth/Sex: [birth year] (39 y.o. M) Treating RN: [name] Primary Care Provider: [name] Other Clinician: Referring Provider: [name] Treating Provider/Extender: Melburn Hake, Sosie Gato Weeks in Treatment: 135 Diagnosis Coding ICD-10 Codes Code Description [phone] Non-pressure chronic ulcer of left calf with fat layer exposed L88 Pyoderma gangrenosum I87.2 Venous insufficiency (chronic) (peripheral) L03.116 Cellulitis of left lower limb Facility Procedures CPT4 Code: 00938182 Description: (Facility Use Only) [phone] - [place] LWR LT LEG Modifier: Quantity: 1 Physician Procedures CPT4 Code: 6789381 Description: 01751 - WC PHYS LEVEL 3 - EST PT Modifier: Quantity: 1 CPT4 Code: Description: ICD-10 Diagnosis Description L97.222 Non-pressure chronic ulcer of left calf with fat layer exposed L88 Pyoderma gangrenosum I87.2 Venous insufficiency (chronic) (peripheral) L03.116 Cellulitis of left lower limb Modifier: Quantity: Electronic Signature(s) Signed: 07/12/2019 9:09:22 AM By: Worthy Keeler PA-C Entered By: Worthy Keeler on 07/12/2019 09:09:21

## 2019-07-16 NOTE — Progress Notes (Signed)
IZAIHA, LO ([id number]) Visit Report for 07/15/2019 Arrival Information Details Patient Name: [name], [name] Date of Service: 07/15/2019 8:15 AM Medical Record Number: [id number] Patient Account Number: [id number] Date of Birth/Sex: 12-Feb-1981 (39 y.o. M) Treating RN: Montey Hora Primary Care Montay Vanvoorhis: [name] Other Clinician: Referring Allyson Tineo: [name] Treating Sharma Lawrance/Extender: Melburn Hake, [name]: 46 Visit Information History Since Last Visit Added or deleted any medications: No Patient Arrived: Ambulatory Any new allergies or adverse reactions: No Arrival Time: 08:17 Had a fall or experienced change in No Accompanied By: self activities of daily living that may affect Transfer Assistance: None risk of falls: Patient Identification Verified: Yes Signs or symptoms of abuse/neglect since last visito No Secondary Verification Process Completed: Yes Hospitalized since last visit: No Patient Requires Transmission-Based Precautions: No Implantable device outside of the clinic excluding No Patient Has Alerts: No cellular tissue based products placed in the center since last visit: Has Dressing in Place as Prescribed: Yes Has Compression in Place as Prescribed: Yes Pain Present Now: Yes Notes 98.6 Electronic Signature(s) Signed: 07/15/2019 8:18:15 AM By: Montey Hora Entered By: Montey Hora on 07/15/2019 08:18:15 [name] ([id number]) -------------------------------------------------------------------------------- Compression Therapy Details Patient Name: [name] Date of Service: 07/15/2019 8:15 AM Medical Record Number: [id number] Patient Account Number: [id number] Date of Birth/Sex: 1981-01-13 (39 y.o. M) Treating RN: Montey Hora Primary Care Bradly Sangiovanni: [name] Other Clinician: Referring Adarryl Goldammer: [name] Treating Lenya Sterne/Extender: Melburn Hake, [name]: 136 Compression Therapy Performed for  Wound Assessment: Wound #1 Left,Lateral Lower Leg Performed By: Clinician Montey Hora, RN Compression Type: Three Layer Pre Treatment ABI: 1.2 Electronic Signature(s) Signed: 07/15/2019 5:02:05 PM By: Montey Hora Entered By: Montey Hora on 07/15/2019 08:26:08 [name], RABEN ([id number]) -------------------------------------------------------------------------------- Encounter Discharge Information Details Patient Name: [name] Date of Service: 07/15/2019 8:15 AM Medical Record Number: [id number] Patient Account Number: [id number] Date of Birth/Sex: 1980-07-21 (39 y.o. M) Treating RN: Montey Hora Primary Care Daeshawn Redmann: [name] Other Clinician: Referring Shadiamond Koska: [name] Treating Artyom Stencel/Extender: Melburn Hake, [name]: 136 Encounter Discharge Information Items Discharge Condition: Stable Ambulatory Status: Ambulatory Discharge Destination: Home Transportation: Private Auto Accompanied By: self Schedule Follow-up Appointment: Yes Clinical Summary of Care: Electronic Signature(s) Signed: 07/15/2019 5:02:05 PM By: Montey Hora Entered By: Montey Hora on 07/15/2019 08:26:41 [name] ([id number]) -------------------------------------------------------------------------------- Wound Assessment Details Patient Name: [name] Date of Service: 07/15/2019 8:15 AM Medical Record Number: [id number] Patient Account Number: [id number] Date of Birth/Sex: 10-17-1980 (39 y.o. M) Treating RN: Montey Hora Primary Care [name]: [name] Other Clinician: Referring [name]: [name] Treating Nolberto Cheuvront/Extender: Melburn Hake, [name]: 136 Wound Status Wound Number: 1 Primary Etiology: Pyoderma Wound Location: Left Lower Leg - Lateral Wound Status: Open Wounding Event: Gradually Appeared Comorbid History: Sleep Apnea, Hypertension, Colitis Date Acquired: 11/18/2015 Weeks Of Treatment: 136 Clustered Wound:  No Photos Wound Measurements Length: (cm) 6 Width: (cm) 6.3 Depth: (cm) 0.8 Area: (cm) 29.688 Volume: (cm) 23.75 % Reduction in Area: -504.8% % Reduction in Volume: -504.8% Epithelialization: None Tunneling: No Undermining: No Wound Description Classification: Full Thickness With Exposed Support Structur Wound Margin: Distinct, outline attached Exudate Amount: Medium Exudate Type: Serous Exudate Color: amber es Foul Odor After Cleansing: No Slough/Fibrino Yes Wound Bed Granulation Amount: Medium (34-66%) Exposed Structure Granulation Quality: Pink Fascia Exposed: No Necrotic Amount: Medium (34-66%) Fat Layer (Subcutaneous Tissue) Exposed: Yes Necrotic Quality: Adherent Slough Tendon Exposed: No Muscle Exposed: Yes Necrosis of Muscle: No Joint Exposed: No Bone Exposed: No Treatment Notes Wound #  1 (Left, Lateral Lower Leg) Notes TCA, sorbact, xsorb, 3 layer left (unna to anchor) Electronic Signature(s) DASTAN, KRIDER ([id number]) Signed: 07/15/2019 5:02:05 PM By: Montey Hora Entered By: Montey Hora on 07/15/2019 08:25:42

## 2019-07-19 ENCOUNTER — Other Ambulatory Visit: Payer: Self-pay

## 2019-07-19 ENCOUNTER — Encounter: Payer: 59 | Attending: Physician Assistant | Admitting: Physician Assistant

## 2019-07-19 DIAGNOSIS — I872 Venous insufficiency (chronic) (peripheral): Secondary | ICD-10-CM | POA: Diagnosis not present

## 2019-07-19 DIAGNOSIS — I10 Essential (primary) hypertension: Secondary | ICD-10-CM | POA: Diagnosis not present

## 2019-07-19 DIAGNOSIS — E669 Obesity, unspecified: Secondary | ICD-10-CM | POA: Diagnosis not present

## 2019-07-19 DIAGNOSIS — Z6841 Body Mass Index (BMI) 40.0 and over, adult: Secondary | ICD-10-CM | POA: Insufficient documentation

## 2019-07-19 DIAGNOSIS — Z881 Allergy status to other antibiotic agents status: Secondary | ICD-10-CM | POA: Insufficient documentation

## 2019-07-19 DIAGNOSIS — Z79899 Other long term (current) drug therapy: Secondary | ICD-10-CM | POA: Insufficient documentation

## 2019-07-19 DIAGNOSIS — Z7952 Long term (current) use of systemic steroids: Secondary | ICD-10-CM | POA: Insufficient documentation

## 2019-07-19 DIAGNOSIS — Z888 Allergy status to other drugs, medicaments and biological substances status: Secondary | ICD-10-CM | POA: Insufficient documentation

## 2019-07-19 DIAGNOSIS — K5190 Ulcerative colitis, unspecified, without complications: Secondary | ICD-10-CM | POA: Diagnosis not present

## 2019-07-19 DIAGNOSIS — L97222 Non-pressure chronic ulcer of left calf with fat layer exposed: Secondary | ICD-10-CM | POA: Insufficient documentation

## 2019-07-19 DIAGNOSIS — L03116 Cellulitis of left lower limb: Secondary | ICD-10-CM | POA: Diagnosis not present

## 2019-07-19 DIAGNOSIS — L88 Pyoderma gangrenosum: Secondary | ICD-10-CM | POA: Diagnosis not present

## 2019-07-19 DIAGNOSIS — G4730 Sleep apnea, unspecified: Secondary | ICD-10-CM | POA: Diagnosis not present

## 2019-07-19 DIAGNOSIS — Z91011 Allergy to milk products: Secondary | ICD-10-CM | POA: Insufficient documentation

## 2019-07-19 DIAGNOSIS — F17210 Nicotine dependence, cigarettes, uncomplicated: Secondary | ICD-10-CM | POA: Insufficient documentation

## 2019-07-19 DIAGNOSIS — L97213 Non-pressure chronic ulcer of right calf with necrosis of muscle: Secondary | ICD-10-CM | POA: Diagnosis not present

## 2019-07-19 NOTE — Progress Notes (Addendum)
CLEOPHAS, YOAK ([id number]) Visit Report for 07/19/2019 Arrival Information Details Patient Name: [name], [name] Date of Service: 07/19/2019 8:15 AM Medical Record Number: [id number] Patient Account Number: [id number] Date of Birth/Sex: 1980-10-07 (39 y.o. M) Treating RN: Primary Care Herman Mell: [name] Other Clinician: Referring Dawayne Ohair: [name] Treating Adeeb Konecny/Extender: Melburn Hake, [name]: 2 Visit Information History Since Last Visit Added or deleted any medications: No Patient Arrived: Ambulatory Any new allergies or adverse reactions: No Arrival Time: 08:21 Had a fall or experienced change in No Accompanied By: self activities of daily living that may affect Transfer Assistance: None risk of falls: Patient Identification Verified: Yes Signs or symptoms of abuse/neglect since last visito No Secondary Verification Process Completed: Yes Hospitalized since last visit: No Patient Requires Transmission-Based Precautions: No Implantable device outside of the clinic excluding No Patient Has Alerts: No cellular tissue based products placed in the center since last visit: Has Dressing in Place as Prescribed: Yes Has Compression in Place as Prescribed: Yes Pain Present Now: Yes Electronic Signature(s) Signed: 07/19/2019 11:27:56 AM By: Lorine Bears RCP, RRT, CHT Entered By: Lorine Bears on 07/19/2019 08:22:44 [name] ([id number]) -------------------------------------------------------------------------------- Compression Therapy Details Patient Name: [name] Date of Service: 07/19/2019 8:15 AM Medical Record Number: [id number] Patient Account Number: [id number] Date of Birth/Sex: 17-May-1981 (39 y.o. M) Treating RN: Army Melia Primary Care Amariyah Bazar: [name] Other Clinician: Referring Lavenia Stumpo: [name] Treating Eden Rho/Extender: Melburn Hake, [name]: 136 Compression Therapy  Performed for Wound Assessment: Wound #1 Left,Lateral Lower Leg Performed By: Clinician Army Melia, RN Compression Type: Three Layer Post Procedure Diagnosis Same as Pre-procedure Electronic Signature(s) Signed: 07/19/2019 11:44:39 AM By: Army Melia Entered By: Army Melia on 07/19/2019 08:55:40 [name] ([id number]) -------------------------------------------------------------------------------- Encounter Discharge Information Details Patient Name: [name] Date of Service: 07/19/2019 8:15 AM Medical Record Number: [id number] Patient Account Number: [id number] Date of Birth/Sex: Nov 08, 1980 (39 y.o. M) Treating RN: Army Melia Primary Care Zykerria Tanton: [name] Other Clinician: Referring Khalifa Knecht: [name] Treating Canio Winokur/Extender: Melburn Hake, [name]: 136 Encounter Discharge Information Items Discharge Condition: Stable Ambulatory Status: Ambulatory Discharge Destination: Home Transportation: Private Auto Accompanied By: self Schedule Follow-up Appointment: Yes Clinical Summary of Care: Electronic Signature(s) Signed: 07/19/2019 11:44:39 AM By: Army Melia Entered By: Army Melia on 07/19/2019 08:57:16 [name] ([id number]) -------------------------------------------------------------------------------- Lower Extremity Assessment Details Patient Name: [name] Date of Service: 07/19/2019 8:15 AM Medical Record Number: [id number] Patient Account Number: [id number] Date of Birth/Sex: 02-21-1981 (39 y.o. M) Treating RN: Montey Hora Primary Care Kortne All: [name] Other Clinician: Referring Nashawn Hillock: [name] Treating Kately Graffam/Extender: Melburn Hake, [name]: 136 Edema Assessment Assessed: [Left: No] [Right: No] Edema: [Left: Ye] [Right: s] Calf Left: Right: Point of Measurement: 32 cm From Medial Instep 44.5 cm cm Ankle Left: Right: Point of Measurement: 10 cm From Medial Instep 29.7 cm  cm Vascular Assessment Pulses: Dorsalis Pedis Palpable: [Left:Yes] Electronic Signature(s) Signed: 07/19/2019 4:41:53 PM By: Montey Hora Entered By: Montey Hora on 07/19/2019 08:38:12 [name] ([id number]) -------------------------------------------------------------------------------- Multi Wound Chart Details Patient Name: [name] Date of Service: 07/19/2019 8:15 AM Medical Record Number: [id number] Patient Account Number: [id number] Date of Birth/Sex: 09-04-1980 (39 y.o. M) Treating RN: Army Melia Primary Care Nayel Purdy: [name] Other Clinician: Referring Azya Barbero: [name] Treating Gagan Dillion/Extender: Melburn Hake, [name]: 136 Vital Signs Height(in): 71 Pulse(bpm): 80 Weight(lbs): 338 Blood Pressure(mmHg): 125/64 Body Mass Index(BMI): 47 Temperature(F): 98.8 Respiratory Rate(breaths/min): 18 Photos: [N/A:N/A] Wound Location: Left Lower  Leg - Lateral N/A N/A Wounding Event: Gradually Appeared N/A N/A Primary Etiology: Pyoderma N/A N/A Comorbid History: Sleep Apnea, Hypertension, Colitis N/A N/A Date Acquired: 11/18/2015 N/A N/A Weeks of Treatment: 136 N/A N/A Wound Status: Open N/A N/A Measurements L x W x D (cm) 6x6.3x0.9 N/A N/A Area (cm) : 29.688 N/A N/A Volume (cm) : 26.719 N/A N/A % Reduction in Area: -504.80% N/A N/A % Reduction in Volume: -580.40% N/A N/A Classification: Full Thickness With Exposed N/A N/A Support Structures Exudate Amount: Medium N/A N/A Exudate Type: Serous N/A N/A Exudate Color: amber N/A N/A Wound Margin: Epibole N/A N/A Granulation Amount: Medium (34-66%) N/A N/A Granulation Quality: Pink N/A N/A Necrotic Amount: Medium (34-66%) N/A N/A Exposed Structures: Fat Layer (Subcutaneous Tissue) N/A N/A Exposed: Yes Muscle: Yes Fascia: No Tendon: No Joint: No Bone: No Epithelialization: None N/A N/A Treatment Notes Electronic Signature(s) Signed: 07/19/2019 11:44:39 AM By: Army Melia Entered  By: Army Melia on 07/19/2019 08:52:02 [name], [name] ([id number]) [name], [name] ([id number]) -------------------------------------------------------------------------------- Multi-Disciplinary Care Plan Details Patient Name: [name] Date of Service: 07/19/2019 8:15 AM Medical Record Number: [id number] Patient Account Number: [id number] Date of Birth/Sex: Oct 14, 1980 (39 y.o. M) Treating RN: Army Melia Primary Care Kalem Rockwell: [name] Other Clinician: Referring Abenezer Odonell: [name] Treating Mikhaela Zaugg/Extender: Melburn Hake, [name]: 136 Active Inactive Venous Leg Ulcer Nursing Diagnoses: Knowledge deficit related to disease process and management Potential for venous Insuffiency (use before diagnosis confirmed) Goals: Non-invasive venous studies are completed as ordered Date Initiated: 12/11/2016 Target Resolution Date: 03/13/2017 Goal Status: Active Patient will maintain optimal edema control Date Initiated: 12/11/2016 Target Resolution Date: 03/13/2017 Goal Status: Active Patient/caregiver will verbalize understanding of disease process and disease management Date Initiated: 12/11/2016 Target Resolution Date: 03/13/2017 Goal Status: Active Verify adequate tissue perfusion prior to therapeutic compression application Date Initiated: 12/11/2016 Target Resolution Date: 03/13/2017 Goal Status: Active Interventions: Assess peripheral edema status every visit. Compression as ordered Provide education on venous insufficiency Treatment Activities: Therapeutic compression applied : 12/11/2016 Notes: Wound/Skin Impairment Nursing Diagnoses: Impaired tissue integrity Knowledge deficit related to ulceration/compromised skin integrity Goals: Patient/caregiver will verbalize understanding of skin care regimen Date Initiated: 12/11/2016 Target Resolution Date: 03/13/2017 Goal Status: Active Ulcer/skin breakdown will have a volume reduction of 30% by week  4 Date Initiated: 12/11/2016 Target Resolution Date: 03/13/2017 Goal Status: Active Ulcer/skin breakdown will have a volume reduction of 50% by week 8 Date Initiated: 12/11/2016 Target Resolution Date: 03/13/2017 Goal Status: Active Ulcer/skin breakdown will have a volume reduction of 80% by week 12 Date Initiated: 12/11/2016 Target Resolution Date: 03/13/2017 [name], [name] ([id number]) Goal Status: Active Ulcer/skin breakdown will heal within 14 weeks Date Initiated: 12/11/2016 Target Resolution Date: 03/13/2017 Goal Status: Active Interventions: Assess patient/caregiver ability to obtain necessary supplies Assess patient/caregiver ability to perform ulcer/skin care regimen upon admission and as needed Assess ulceration(s) every visit Provide education on ulcer and skin care Treatment Activities: Skin care regimen initiated : 12/11/2016 Topical wound management initiated : 12/11/2016 Notes: Electronic Signature(s) Signed: 07/19/2019 11:44:39 AM By: Army Melia Entered By: Army Melia on 07/19/2019 [name], [name] ([id number]) -------------------------------------------------------------------------------- Pain Assessment Details Patient Name: [name] Date of Service: 07/19/2019 8:15 AM Medical Record Number: [id number] Patient Account Number: [id number] Date of Birth/Sex: 27-Aug-1980 (38 y.o. M) Treating RN: Montey Hora Primary Care Jeryn Bertoni: [name] Other Clinician: Referring Sherlie Boyum: [name] Treating Ilona Colley/Extender: Melburn Hake, [name]: 136 Active Problems Location of Pain Severity and Description of Pain Patient Has Paino Yes Site Locations Pain  Location: Pain in Ulcers Pain Management and Medication Current Pain Management: Electronic Signature(s) Signed: 07/19/2019 4:41:53 PM By: Montey Hora Entered By: Montey Hora on 07/19/2019 08:31:15 [name]  ([id number]) -------------------------------------------------------------------------------- Patient/Caregiver Education Details Patient Name: [name] Date of Service: 07/19/2019 8:15 AM Medical Record Number: [id number] Patient Account Number: [id number] Date of Birth/Gender: 06/12/1980 (39 y.o. M) Treating RN: Army Melia Primary Care Physician: [name] Other Clinician: Referring Physician: [name] Treating Physician/Extender: Sharalyn Ink in Treatment: 136 Education Assessment Education Provided To: Patient Education Topics Provided Wound/Skin Impairment: Handouts: Caring for Your Ulcer Methods: Demonstration, Explain/Verbal Responses: State content correctly Electronic Signature(s) Signed: 07/19/2019 11:44:39 AM By: Army Melia Entered By: Army Melia on 07/19/2019 08:56:51 [name], [name] ([id number]) -------------------------------------------------------------------------------- Wound Assessment Details Patient Name: [name] Date of Service: 07/19/2019 8:15 AM Medical Record Number: [id number] Patient Account Number: [id number] Date of Birth/Sex: 22-Apr-1981 (39 y.o. M) Treating RN: Montey Hora Primary Care Margel Joens: [name] Other Clinician: Referring Savahna Casados: [name] Treating Naseem Adler/Extender: Melburn Hake, [name]: 136 Wound Status Wound Number: 1 Primary Etiology: Pyoderma Wound Location: Left Lower Leg - Lateral Wound Status: Open Wounding Event: Gradually Appeared Comorbid History: Sleep Apnea, Hypertension, Colitis Date Acquired: 11/18/2015 Weeks Of Treatment: 136 Clustered Wound: No Photos Wound Measurements Length: (cm) 6 Width: (cm) 6.3 Depth: (cm) 0.9 Area: (cm) 29.688 Volume: (cm) 26.719 % Reduction in Area: -504.8% % Reduction in Volume: -580.4% Epithelialization: None Tunneling: No Undermining: No Wound Description Classification: Full Thickness With Exposed Support  Structur Wound Margin: Epibole Exudate Amount: Medium Exudate Type: Serous Exudate Color: amber es Foul Odor After Cleansing: No Slough/Fibrino Yes Wound Bed Granulation Amount: Medium (34-66%) Exposed Structure Granulation Quality: Pink Fascia Exposed: No Necrotic Amount: Medium (34-66%) Fat Layer (Subcutaneous Tissue) Exposed: Yes Necrotic Quality: Adherent Slough Tendon Exposed: No Muscle Exposed: Yes Necrosis of Muscle: No Joint Exposed: No Bone Exposed: No Treatment Notes Wound #1 (Left, Lateral Lower Leg) Notes TCA, sorbact, xsorb, 3 layer left (unna to anchor) Electronic Signature(s) [name], [name] ([id number]) Signed: 07/19/2019 4:41:53 PM By: Montey Hora Entered By: Montey Hora on 07/19/2019 08:40:20 [name] ([id number]) -------------------------------------------------------------------------------- Vitals Details Patient Name: [name] Date of Service: 07/19/2019 8:15 AM Medical Record Number: [id number] Patient Account Number: [id number] Date of Birth/Sex: Jul 20, 1980 (39 y.o. M) Treating RN: Primary Care Anjenette Gerbino: [name] Other Clinician: Referring Heaven Wandell: [name] Treating Markala Sitts/Extender: Melburn Hake, [name]: 136 Vital Signs Time Taken: 08:20 Temperature (F): 98.8 Height (in): 71 Pulse (bpm): 80 Weight (lbs): 338 Respiratory Rate (breaths/min): 18 Body Mass Index (BMI): 47.1 Blood Pressure (mmHg): 125/64 Reference Range: 80 - 120 mg / dl Electronic Signature(s) Signed: 07/19/2019 11:27:56 AM By: Lorine Bears RCP, RRT, CHT Entered By: Lorine Bears on 07/19/2019 08:24:36

## 2019-07-19 NOTE — Progress Notes (Addendum)
[name], [name] ([id number]) Visit Report for 07/19/2019 Chief Complaint Document Details Patient Name: [name], [name] Date of Service: 07/19/2019 8:15 AM Medical Record Number: [id number] Patient Account Number: [id number] Date of Birth/Sex: July 03, 1980 (39 y.o. 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 M) Treating RN: Primary Care Provider: [name] Other Clinician: Referring Provider: [name] Treating Provider/Extender: Melburn Hake, Shanaye Rief Weeks in Treatment: 136 History of Present Illness HPI Description: 12/04/16; 39 year old man who comes into the clinic today for review of a wound on the posterior left calf. He tells me that is been there for about a year. He is not a diabetic he does smoke half a pack per day. He was seen in the ER on 11/20/16 felt to have cellulitis around the wound and was given clindamycin. An x-ray did not show osteomyelitis. The patient initially tells me that he has a milk allergy that sets off a pruritic itching rash on his lower legs which she scratches incessantly and he thinks that's what may have set up the wound. He has been using various topical antibiotics and ointments without any effect. He works in a [employer] and is on his feet all day. He does not have a prior history of wounds however he  does have the rash on both lower legs the right arm and the ventral aspect of his left arm. These are excoriations and clearly have had scratching however there are of macular looking areas on both legs including a substantial larger area on the right leg. This does not have an underlying open area. There is no blistering. The patient tells me that 2 years ago in [place] in response to the rash on his legs he saw a dermatologist who told him he had a condition which may be pyoderma gangrenosum although I may be putting words into his mouth. He seemed to recognize this. 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 He is not in any treatment for this. He's had no recent travel 12/11/16; the patient arrives today with his wound and roughly the same condition we've been using silver alginate this is a deep punched out wound with some surrounding erythema but no tenderness. Biopsy I did did not show confirmed pyoderma gangrenosum suggested nonspecific inflammation and vasculitis but does not provide an actual description of what was seen by the pathologist. I'm really not able to understand this We have also received information from the patient's dermatologist in [place] notes from April 2016. This was a doctor Agarwal-antal. 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 He was prescribed topical steroid high potency under occlusion which helped but at this point the patient did not have a deep punched out wound. 12/18/16; the patient's wound is larger in terms of surface area however this surface looks better and there is less depth. The surrounding erythema also is better. The patient states that the wrap we put on came off 2 days ago when he has been using his compression stockings. He we  are in the process of getting a dermatology consult. 12/26/16 on evaluation today patient's left lower extremity wound shows evidence of infection with surrounding erythema  noted. He has been tolerating the dressing changes but states that he has noted more discomfort. There is a larger area of erythema surrounding the wound. No fevers, chills, nausea, or vomiting noted at this time. With that being said the wound still does have slough covering the surface. He is not allergic to any medication that he is aware of at this point. In regard to his right lower extremity he had several regions that are erythematous and pruritic he wonders if there's anything we can do to help that. 01/02/17 I reviewed patient's wound culture which was obtained his visit last week. He was placed on doxycycline at that point. Unfortunately that does not appear to be an antibiotic that would likely help with the situation however the pseudomonas noted on culture is sensitive to Cipro. Also unfortunately patient's wound seems to have a large compared to last week's evaluation. Not severely so but there are definitely increased measurements in general. He is continuing to have discomfort as well he writes this to be a seven out of 10. In fact he would prefer me not to perform any debridement today due to the fact that he is having discomfort and considering he has an active infection on the little reluctant to do so anyway. No fevers, chills, nausea, or vomiting noted at this time. 01/08/17; patient seems dermatology on September 5. I suspect dermatology will want the slides from the biopsy I did sent to their pathologist. I'm not sure if there is a way we can expedite that. In any case the culture I did before I left on vacation 3 weeks ago showed Pseudomonas he was given 10 days of Cipro and per her description of her intake nurses is actually somewhat better this week although the wound is quite a bit bigger than I remember the last time I saw this. He still has 3 more days of Cipro 01/21/17; dermatology appointment tomorrow. He has completed the ciprofloxacin for Pseudomonas. Surface of the wound  looks better however he is had some deterioration in the lesions on his right leg. Meantime the left lateral leg wound we will continue with sample 01/29/17; patient had his dermatology appointment but I can't yet see that note. He is completed his antibiotics. The wound is more superficial but considerably larger in circumferential area than when he came in. This is in his left lateral calf. He also has swollen erythematous areas with superficial wounds on the right leg and small papular areas on both arms. There apparently areas in her his upper thighs and buttocks I did not look at those. Dermatology biopsied the right leg. Hopefully will have their input next week. 02/05/17; patient went back to see his dermatologist who told him that he had a "scratching problem" as well as staph. He is now on a 30 day course of doxycycline and I believe she gave him triamcinolone cream to the right leg areas to help with the itching [not exactly sure but probably triamcinolone]. She apparently looked at the left lateral leg wound although this was not rebiopsied and I think felt to be ultimately part of the same pathogenesis. He is using sample border foam and changing nevus himself. He now has a new open area on the right posterior leg which was his biopsy site I don't have any of the dermatology notes 02/12/17; we  put the patient in compression last week with SANTYL to the wound on the left leg and the biopsy. Edema is much better and the depth of the wound is now at level of skin. Area is still the same oBiopsy site on the right lateral leg we've also been using santyl with a border foam dressing and he is changing this himself. 02/19/17; Using silver alginate started last week to both the substantial left leg wound and the biopsy site on the right wound. He is tolerating compression well. Has a an appointment with his primary M.D. tomorrow wondering about diuretics although I'm wondering if the edema problem  is [name], [name] ([id number]) actually lymphedema 02/26/17; the patient has been to see his primary doctor Dr. Jerrel Ivory at [facility] our primary care. She started him on Lasix 20 mg and this seems to have helped with the edema. However we are not making substantial change with the left lateral calf wound and inflammation. The biopsy site on the right leg also looks stable but not really all that different. 03/12/17; the patient has been to see vein and vascular Dr. Lucky Cowboy. He has had venous reflux studies I have not reviewed these. I did get a call from his dermatology office. 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 They therefore recommended clobetasol and tetracycline i.e. 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 03/26/17; apparently the patient just had reflux studies not an appointment with Dr. dew. She arrives in clinic today having applied clobetasol for 2-3 weeks. He notes over the last 2-3 days excessive drainage having to change the dressing 3-4 times a day and also expanding erythema. He states the expanding erythema seems to come and go and was last this red was earlier in the month.he is on doxycycline 150 mg twice a day as an anti- inflammatory systemic therapy for possible pyoderma gangrenosum along with the topical clobetasol 04/02/17; the patient was seen last week by Dr. Lillia Carmel at [facility] dermatology locally who kindly saw him at my request. 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 My concern was the degree of erythema medially extending from his left leg wound which was either inflammation from pyoderma or cellulitis. I put him on  Augmentin however culture of the wound showed Pseudomonas which is quinolone sensitive. I really don't believe he has cellulitis however in view of everything I will continue and give him a course of Cipro. He is also on doxycycline as an immune modulator for the pyoderma. In addition to his original wound on the left lateral leg with surrounding erythema he has a wound on the right posterior calf which was an original biopsy site done by dermatology. 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 Saw Dr. Lillia Carmel yesterday. He has been using topical antibiotics to both wound areas his original wound on the left and the biopsies/pathergy area on the right. There is definitely some improvement in the inflammation around the wound on the right although the patient states he has increasing sensitivity of the wounds. He is on prednisone 60 and doxycycline 1 as prescribed by Dr. Lillia Carmel. He is covering the topical antibiotic with gauze and  putting this in his own compression stocks and changing this daily. He states that Dr. Lottie Rater did a culture of the left leg wound yesterday 05/07/17; pyoderma gangrenosum. The patient saw Dr. Lillia Carmel yesterday and has a follow-up with her in one month. He is still using topical antibiotics to both wounds although he can't recall exactly what type. He is still on prednisone 60 mg. Dr. Lillia Carmel stated that the doxycycline could stop if we were in agreement. 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 The right medial leg was induced by biopsy/pathergy. The area on the right is essentially healed. 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 The right medial leg has closed and remains closed. He is still on prednisone  60. oHe tells me he missed his last dermatology appointment with Dr. Lillia Carmel but will make another appointment. He reports that her blood sugar at a recent screen in [place] was high 200's. He was 180 today. 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 I think he is going to require still much longer prednisone perhaps another 3 months before attempting to taper. In the meantime his wound is a lot better. Smaller. He is cleaning this off daily and applying topical antibiotics. When he was last in the clinic I thought about changing to [name] and actually put in a couple of calls to dermatology although probably not during their business hours. 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 He continues on oral prednisone. He has been using triple antibiotic ointment. There is surface debris and we will transition to [facility] and have him return in 2 weeks. He has lost 30 pounds since his last appointment with lifestyle modification. He may benefit from topical steroid cream for treatment this can be considered at a later date. 08/22/17 on evaluation today patient appears to actually be doing rather well in regard to his left lateral lower extremity ulcer. He has actually been managed by Dr. Dellia Nims most recently. Patient is currently on oral steroids at this time. This seems to have been of benefit for him. Nonetheless his last visit was actually with Leah on 08/06/17. Currently he is not utilizing any topical steroid creams although this could be of benefit as well. No fevers, chills, nausea, or vomiting noted at this time. 09/05/17 on evaluation today patient appears to be doing better in regard to his left lateral lower extremity ulcer. He has been tolerating the dressing changes without complication. He is using Santyl with good effect. Overall I'm very pleased with how  things are standing at this point. Patient likewise is happy that this is doing better. 09/19/17 on evaluation today patient actually appears to be doing rather well in regard to his left lateral lower extremity ulcer. 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 He's not having any significant pain. 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 He has been tolerating the Santyl without complication and in general I feel like he's making good progress. 10/17/17 on evaluation today patient appears  to be doing very well in regard to his left lateral lower surety ulcer. He has been tolerating the dressing changes without complication. There does not appear to be any evidence of infection he's alternating the Santyl and the triple antibiotic ointment every other day this seems to be doing well for him. 11/03/17 on evaluation today patient appears to be doing very well in regard to his left lateral lower extremity ulcer. He is been tolerating the dressing changes without complication which is good news. Fortunately there does not appear to be any evidence of infection which is also great news. Overall is doing excellent they are starting to taper down on the prednisone is down to 40 mg at this point it also started topical clobetasol for him. 11/17/17 on evaluation today patient appears to be doing well in regard to his left lateral lower surety ulcer. He's been tolerating the dressing changes without complication. He does note that he is having no pain, no excessive drainage or discharge, and overall he feels like things are going about how he would expect and hope they would. Overall he seems to have no evidence of infection at this time in my opinion which is good news. 12/04/17-He is seen in follow-up evaluation for right lateral  lower extremity ulcer. He has been applying topical steroid cream. Today's measurement show slight increase in size. Over the next 2 weeks we will transition to every other day Santyl and steroid cream. He has been encouraged to monitor for changes and notify clinic with any concerns 12/15/17 on evaluation today patient's left lateral motion the ulcer and fortunately is doing worse again at this point. This just since last week to this [place] ([id number]) week has close to doubled in size according to the patient. I did not seeing last week's I do not have a visual to compare this to in our system was also down so we do not have all the charts and at this point. Nonetheless it does have me somewhat concerned in regard to the fact that again he was worried enough about it he has contact the dermatology that placed them back on the full strength, 50 mg a day of the prednisone that he was taken previous. He continues to alternate using clobetasol along with Santyl at this point. He is obviously somewhat frustrated. 12/22/17 on evaluation today patient appears to be doing a little worse compared to last evaluation. Unfortunately the wound is a little deeper and slightly larger than the last week's evaluation. With that being said he has made some progress in regard to the irritation surrounding at this time unfortunately despite that progress that's been made he still has a significant issue going on here. 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 For that reason I discussed with him today the possibility of perform a wound culture to ensure there's nothing overtly infected. 01/06/18 on evaluation today patient's wound is larger and deeper than previously evaluated. With that being said it did appear that his wound was infected after my last evaluation  with him. Subsequently I did end up prescribing a prescription for Bactrim DS which she has been taking and having no complication with. Fortunately there does not appear to be any evidence of infection at this point in time as far as anything spreading, no want to touch, and overall I feel like things  are showing signs of improvement. 01/13/18 on evaluation today patient appears to be even a little larger and deeper than last time. There still muscle exposed in the base of the wound. Nonetheless he does appear to be less erythematous I do believe inflammation is calming down also believe the infection looks like it's probably resolved at this time based on what I'm seeing. No fevers, chills, nausea, or vomiting noted at this time. 01/30/18 on evaluation today patient actually appears to visually look better for the most part. Unfortunately those visually this looks better he does seem to potentially have what may be an abscess in the muscle that has been noted in the central portion of the wound. This is the first time that I have noted what appears to be fluctuance in the central portion of the muscle. With that being said I'm somewhat more concerned about the fact that this might indicate an abscess formation at this location. I do believe that an ultrasound would be appropriate. This is likely something we need to try to do as soon as possible. He has been switch to mupirocin ointment and he is no longer using the steroid ointment as prescribed by dermatology he sees them again next week he's been decreased from 60 to 40 mg of prednisone. 03/09/18 on evaluation today patient actually appears to be doing a little better compared to last time I saw him. There's not as much erythema surrounding the wound itself. He I did review his most recent infectious disease note which was dated 02/24/18. He saw Dr. Michel Bickers in [place]. 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 Patient is now off of antibiotics and they are continually observing this. There seems to be no change in the past two weeks in my pinion based on what the patient says and what I see today compared to what Dr. Megan Salon likely saw two weeks ago. No fevers, chills, nausea, or vomiting noted at this time. 03/23/18 on evaluation today patient's wound actually appears to be showing signs of improvement which is good news. He is currently still on the Dapsone. He is also working on tapering the prednisone to get off of this and Dr. Lottie Rater is working with him in this regard. Nonetheless overall I feel like the wound is doing well it does appear based on the infectious disease note that I reviewed from Dr. 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 I did also review the results of his ultrasound of left lower extremity which revealed there was a dentist tissue in the base of the wound without an abscess noted. 04/06/18 on evaluation today the patient's left lateral lower extremity ulcer actually appears to be doing fairly well which is excellent news. There does not appear to be any evidence of infection at this time which is also great news. Overall he still does have a significantly large ulceration although little by little he seems to be making progress. He is down to 10 mg a day of the prednisone. 04/20/18 on evaluation today patient actually appears to be doing excellent at this time in regard to his left lower extremity ulcer. He's making signs of good progress unfortunately this is taking much longer than we would really like to see but nonetheless he is making  progress. Fortunately there does not appear to be any evidence of infection at this time. No fevers, chills, nausea, or vomiting noted at this time. The patient has not been  using the Santyl due to the cost he hadn't got in this field yet. He's mainly been using the antibiotic ointment topically. Subsequently he also tells me that he really has not been scrubbing in the shower I think this would be helpful again as I told him it doesn't have to be anything too aggressive to even make it believe just enough to keep it free of some of the loose slough and biofilm on the wound surface. 05/11/18 on evaluation today patient's wound appears to be making slow but sure progress in regard to the left lateral lower extremity ulcer. He is been tolerating the dressing changes without complication. Fortunately there does not appear to be any evidence of infection at this time. He is still just using triple antibiotic ointment along with clobetasol occasionally over the area. He never got the Santyl and really does not seem to intend to in my pinion. 06/01/18 on evaluation today patient appears to be doing a little better in regard to his left lateral lower extremity ulcer. He states that overall he does not feel like he is doing as well with the Dapsone as he did with the prednisone. Nonetheless he sees his dermatologist later today and is gonna talk to them about the possibility of going back on the prednisone. Overall again I believe that the wound would be better if you would utilize Santyl but he really does not seem to be interested in going back to the [place] at this point. He has been using triple antibiotic ointment. 06/15/18 on evaluation today patient's wound actually appears to be doing about the same at this point. Fortunately there is no signs of infection at this time. He has made slight improvements although he continues to not really want to clean the wound bed at this point. He states that he just doesn't mess with it he doesn't want to cause any problems with everything else he has going on. He has been on medication, antibiotics as prescribed by his dermatologist, for  a staff infection of his lower extremities which is really drying out now and looking much better he tells me. Fortunately there is no sign of overall infection. 06/29/18 on evaluation today patient appears to be doing well in regard to his left lateral lower surety ulcer all things considering. Fortunately his staff infection seems to be greatly improved compared to previous. He has no signs of infection and this is drying up quite nicely. He is still the doxycycline for this is no longer on cental, Dapsone, or any of the other medications. His dermatologist has recommended possibility of an infusion but right now he does not want to proceed with that. 07/13/18 on evaluation today patient appears to be doing about the same in regard to his left lateral lower surety ulcer. Fortunately there's no signs of infection at this time which is great news. Unfortunately he still builds up a significant amount of Slough/biofilm of the surface of the wound he still is not Mellott, Clayburn ([id number]) really cleaning this as he should be appropriately. Again I'm able to easily with saline and gauze remove the majority of this on the surface which if you would do this at home would likely be a dramatic improvement for him as far as getting the area to improve. Nonetheless overall I still feel  like he is making progress is just very slow. I think Santyl will be of benefit for him as well. Still he has not gotten this as of this point. 07/27/18 on evaluation today patient actually appears to be doing little worse in regards of the erythema around the periwound region of the wound he also tells me that he's been having more drainage currently compared to what he was experiencing last time I saw him. He states not quite as bad as what he had because this was infected previously but nonetheless is still appears to be doing poorly. Fortunately there is no evidence of systemic infection at this point. The patient tells me that  he is not going to be able to afford the Santyl. He is still waiting to hear about the infusion therapy with his dermatologist. Apparently she wants an updated colonoscopy first. 08/10/18 on evaluation today patient appears to be doing better in regard to his left lateral lower extremity ulcer. Fortunately he is showing signs of improvement in this regard he's actually been approved for Remicade infusion's as well although this has not been scheduled as of yet. Fortunately there's no signs of active infection at this time in regard to the wound although he is having some issues with infection of the right lower extremity is been seen as dermatologist for this. Fortunately they are definitely still working with him trying to keep things under control. 09/07/18 on evaluation today patient is actually doing rather well in regard to his left lateral lower extremity ulcer. He notes these actually having some hair grow back on his extremity which is something he has not seen in years. He also tells me that the pain is really not giving them any trouble at this time which is also good news overall she is very pleased with the progress he's using a combination of the mupirocin along with the probate is all mixed. 09/21/18 on evaluation today patient actually appears to be doing fairly well all things considered in regard to his looks from the ulcer. He's been tolerating the dressing changes without complication. Fortunately there's no signs of active infection at this time which is good news he is still on all antibiotics or prevention of the staff infection. He has been on prednisone for time although he states it is gonna contact his dermatologist and see if she put them on a short course due to some irritation that he has going on currently. Fortunately there's no evidence of any overall worsening this is going very slow I think cental would be something that would be helpful for him although he states that $50  for tube is quite expensive. He therefore is not willing to get that at this point. 10/06/18 on evaluation today patient actually appears to be doing decently well in regard to his left lateral leg ulcer. He's been tolerating the dressing changes without complication. Fortunately there's no signs of active infection at this time. Overall I'm actually rather pleased with the progress he's making although it's slow he doesn't show any signs of infection and he does seem to be making some improvement. I do believe that he may need a switch up and dressings to try to help this to heal more appropriately and quickly. 10/19/18 on evaluation today patient actually appears to be doing better in regard to his left lateral lower extremity ulcer. This is shown signs of having much less Slough buildup at this point due to the fact he has been using the  Santyl. Obviously this is very good news. The overall size of the wound is not dramatically smaller but again the appearance is. 11/02/18 on evaluation today patient actually appears to be doing quite well in regard to his lower [place] ulcer. A lot of the skin around the ulcer is actually somewhat irritating at this point this seems to be more due to the dressing causing irritation from the adhesive that anything else. Fortunately there is no signs of active infection at this time. 11/24/18 on evaluation today patient appears to be doing a little worse in regard to his overall appearance of his lower extremity ulcer. There's more erythema and warmth around the wound unfortunately. He is currently on doxycycline which he has been on for some time. With that being said I'm not sure that seems to be helping with what appears to possibly be an acute cellulitis with regard to his left lower extremity ulcer. No fevers, chills, nausea, or vomiting noted at this time. 12/08/18 on evaluation today patient's wounds actually appears to be doing significantly better compared to his  last evaluation. He has been using Santyl along with alternating tripling about appointment as well as the steroid cream seems to be doing quite well and the wound is showing signs of improvement which is excellent news. Fortunately there's no evidence of infection and in fact his culture came back negative with only normal skin flora noted. 12/21/2018 upon evaluation today patient actually appears to be doing excellent with regard to his ulcer. This is actually the best that I have seen it since have been helping to take care of him. It is both smaller as well as less slough noted on the surface of the wound and seems to be showing signs of good improvement with new skin growing from the edges. He has been using just the triamcinolone he does wonder if he can get a refill of that ointment today. 01/04/2019 upon evaluation today patient actually appears to be doing well with regard to his left lateral lower extremity ulcer. With that being said it does not appear to be that he is doing quite as well as last time as far as progression is concerned. There does not appear to be any signs of infection or significant irritation which is good news. With that being said I do believe that he may benefit from switching to a collagen based dressing based on how clean The wound appears. 01/18/2019 on evaluation today patient actually appears to be doing well with regard to his wound on the left lower extremity. He is not made a lot of progress compared to where we were previous but nonetheless does seem to be doing okay at this time which is good news. There is no signs of active infection which is also good news. My only concern currently is I do wish we can get him into utilizing the collagen dressing his insurance would not pay for the supplies that we ordered although it appears that he may be able to order this through his supply company that he typically utilizes. This is [employer]. Nonetheless he did try to  order it during the office visit today and it appears this did go through. We will see if he can get that it is a different brand but nonetheless he has collagen and I do think will be beneficial. 02/01/2019 on evaluation today patient actually appears to be doing a little worse today in regard to the overall size of his wounds. Fortunately there is  no signs of active infection at this time. That is visually. Nonetheless when this is happened before it was due to infection. For that reason were somewhat concerned about that this time as well. 02/08/2019 on evaluation today patient unfortunately appears to be doing slightly worse with regard to his wound upon evaluation today. Is measuring a Edgington, Khristopher ([id number]) little deeper and a little larger unfortunately. I am not really sure exactly what is causing this to enlarge he actually did see his dermatologist she is going to see about initiating Humira for him. Subsequently she also did do steroid injections into the wound itself in the periphery. Nonetheless still nonetheless he seems to be getting a little bit larger he is gone back to just using the steroid cream topically which I think is appropriate. I would say hold off on the collagen for the time being is definitely a good thing to do. Based on the culture results which we finally did get the final result back regarding it shows staph as the bacteria noted again that can be a normal skin bacteria based on the fact however he is having increased drainage and worsening of the wound measurement [name] I would go ahead and place him on an antibiotic today I do believe for this. 02/15/2019 on evaluation today patient actually appears to be doing somewhat better in regard to his ulcer. There is no signs of worsening at this time I did review his culture results which showed evidence of Staphylococcus aureus but not MRSA. Again this could just be more related to the normal skin bacteria although he  states the drainage has slowed down quite a bit he may have had a mild infection not just colonization. And was much smaller and then since around10/04/2019 on evaluation today patient appears to be doing unfortunately worse as far as the size of the wound. I really feel like that this is steadily getting larger again it had been doing excellent right at the beginning of September we have seen a steady increase in the area of the wound it is almost 2-1/2 times the size it was on September 1. Obviously this is a bad trend this is not wanting to see. For that reason we went back to using just the topical triamcinolone cream which does seem to help with inflammation. I checked him for bacteria by way of culture and nothing showed positive there. I am considering giving him a short course of a tapering steroid Dosepak today to see if that is can be beneficial for him. The patient is in agreement with giving that a try. 03/08/2019 on evaluation today patient appears to be doing very well in comparison to last evaluation with regard to his lower extremity ulcer. This is showing signs of less inflammation and actually measuring slightly smaller compared to last time every other week over the past month and a half he has been measuring larger larger larger. Nonetheless I do believe that the issue has been inflammation the prednisone does seem to have been beneficial for him which is good news. No fevers, chills, nausea, vomiting, or diarrhea. 03/22/2019 on evaluation today patient appears to be doing about the same with regard to his leg ulcer. He has been tolerating the dressing changes without complication. With that being said the wound seems to be mostly arrested at its current size but really is not making any progress except for when we prescribed the prednisone. He did show some signs of dropping as far as the  overall size of the wound during that interval week. Nonetheless this is something he is not on  long-term at this point and unfortunately I think he is getting need either this or else the Humira which his dermatologist has discussed try to get approval for. With that being said he will be seeing his dermatologist on the 11th of this month that is November. 04/19/2019 on evaluation today patient appears to be doing really about the same the wound is measuring slightly larger compared to last time I saw him. He has not been into the office since November 2 due to the fact that he unfortunately had Covid as that his entire family. He tells me that it was rough but they did pull-through and he seems to be doing much better. Fortunately there is no signs of active infection at this time. No fevers, chills, nausea, vomiting, or diarrhea. 05/10/2019 on evaluation today patient unfortunately appears to be doing significantly worse as compared to last time I saw him. He does tell me that he has had his first dose of Humira and actually is scheduled to get the next one in the upcoming week. With that being said he tells me also that in the past several days he has been having a lot of issues with green drainage she showed me a picture this is more blue-green in color. He is also been having issues with increased sloughy buildup and the wound does appear to be larger today. Obviously this is not the direction that we want everything to take based on the starting of his Humira. Nonetheless I think this is definitely a result of likely infection and to be honest I think this is probably Pseudomonas causing the infection based on what I am seeing. 05/24/2019 on evaluation today patient unfortunately appears to be doing significantly worse compared to his prior evaluation with me 2 weeks ago. I did review his culture results which showed that he does have Staph aureus as well as Pseudomonas noted on the culture. Nonetheless the Levaquin that I prescribed for him does not appear to have been appropriate and in  fact he tells me he is no longer experiencing the green drainage and discharge that he had at the last visit. Fortunately there is no signs of active infection at this time which is good news although the wound has significantly worsened it in fact is much deeper than it was previous. We have been utilizing up to this point triamcinolone ointment as the prescription topical of choice but at this time I really feel like that the wound is getting need to be packed in order to appropriately manage this due to the deeper nature of the wound. Therefore something along the lines of an alginate dressing may be more appropriate. 05/31/2019 upon inspection today patient's wound actually showed signs of doing poorly at this point. Unfortunately he just does not seem to be making any good progress despite what we have tried. He actually did go ahead and pick up the Cipro and start taking that as he was noticing more green drainage he had previously completed the Levaquin that I prescribed for him as well. Nonetheless he missed his appointment for the seventh last week on Wednesday with the wound care center and [facility] where his dermatologist referred him. Obviously I do think a second opinion would be helpful at this point especially in light of the fact that the patient seems to be doing so poorly despite the fact that  we have tried everything that I really know how at this point. The only thing that ever seems to have helped him in the past is when he was on high doses of continual steroids that did seem to make a difference for him. 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 06/07/2019 upon evaluation today patient unfortunately appears to be doing worse yet again with regard to his wound. In fact I am starting to question whether or not he may  have a fluid pocket in the muscle at this point based on the bulging and the soft appearance to the central portion of the muscle area. There is not anything draining from the muscle itself at this time which is good news but nonetheless the wound is expanding. I am not really seeing any results of the Humira as far as overall wound progression based on what I am seeing at this point. The patient has been referred for second opinion with regard to his wound to the [facility] wound care center by his dermatologist which I definitely am not in opposition to. Unfortunately we tried multiple dressings in the past including collagen, alginate, and at one point even Hydrofera Blue. With that being said he is never really used it for any significant amount of time due to the fact that he often complains of pain associated with these dressings and then will go back to either using the Santyl which she has done intermittently or more frequently the triamcinolone. He is also using his own compression stockings. We have wrapped him in the past but again that was something else that he really was not a big fan of. 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 He seemed to do much better when he was on the steroids although honestly I understand that the steroids are not necessarily the best medication to be on long-term obviously Gintz, Jaymond ([id number]) 06/14/2019 on evaluation today patient appears to be doing actually a little bit better with regard to the overall appearance with his leg. Unfortunately he does continue to have issues with what appears to be some fluid underneath the muscle although he  did see the wound specialty center at [facility] last week their main goals were to see about infusion therapy in place of the Humira as they feel like that is not quite strong enough. They also recommended that we continue with the treatment otherwise as we are they felt like that was appropriate and they are okay with him continuing to follow-up here with [place] in that regard. With that being said they are also sending him to the vein specialist there to see about vein stripping and if that would be of benefit for him. Subsequently they also did not really address whether or not an ultrasound of the muscle area to see if there is anything that needs to be addressed here would be appropriate or not. For that reason I discussed this with him last week I  think we may proceed down that road at this point. 06/21/2019 upon evaluation today patient's wound actually appears to be doing slightly better compared to previous evaluations. I do believe that he has made a difference with regard to the progression here with the use of oral steroids. Again in the past has been the only thing that is really calm things down. He does tell me that from [facility] is gotten a good news from there that there are no further vein stripping that is necessary at this point. I do not have that available for review today although the patient did relay this to me. He also did obtain and have the ultrasound of the wound completed which I did sign off on today. It does appear that there is no fluid collection under the muscle this is likely then just edematous tissue in general. That is also good news. Overall I still believe the inflammation is the main issue here. He did inquire about the possibility of a wound VAC again with the muscle protruding like it is I am not really sure whether the wound VAC is necessarily ideal or not. That is something we will have to consider although I do believe he may need compression wrapping to try to help with  edema control which could potentially be of benefit. 06/28/2019 on evaluation today patient appears to be doing slightly better measurement [name] although this is not terribly smaller he least seems to be trending towards that direction. With that being said he still seems to have purulent drainage noted in the wound bed at this time. He has been on Levaquin followed by Cipro over the past month. Unfortunately he still seems to have some issues with active infection at this time. I did perform a culture last week in order to evaluate and see if indeed there was still anything going on. Subsequently the culture did come back showing Pseudomonas which is consistent with the drainage has been having which is blue-green in color. He also has had an odor that again was somewhat consistent with Pseudomonas as well. Long story short it appears that the culture showed an intermediate finding with regard to how well the Cipro will work for the Pseudomonas infection. Subsequently being that he does not seem to be clearing up and at best what we are doing is just keeping this at [place] I think he may need to see infectious disease to discuss IV antibiotic options. 07/05/2019 upon evaluation today patient appears to be doing okay in regard to his leg ulcer. He has been tolerating the dressing changes at this point without complication. Fortunately there is no signs of active infection at this time which is good news. No fevers, chills, nausea, vomiting, or diarrhea. With that being said he does have an appointment with infectious disease tomorrow and his primary care on Wednesday. Again the reason for the infectious disease referral was due to the fact that he did not seem to be fully resolving with the use of oral antibiotics and therefore we were thinking that IV antibiotic therapy may be necessary secondary to the fact that there was an intermediate finding for how effective the Cipro may be. Nonetheless again he has  been having a lot of purulent and even green drainage. Fortunately right now that seems to have calmed down over the past week with the reinitiation of the oral antibiotic. Nonetheless we will see what Dr. Megan Salon has to say. 07/12/2019 upon evaluation today patient appears to be doing  about the same at this point in regard to his left lower extremity ulcer. Fortunately there is no signs of active infection at this time which is good news I do believe the Levaquin has been beneficial I did review Dr. Hale Bogus note and to be honest I agree that the patient's leg does appear to be doing better currently. What we found in the past as he does not seem to really completely resolve he will stop the antibiotic and then subsequently things will revert back to having issues with blue-green drainage, increased pain, and overall worsening in general. Obviously that is the reason I sent him back to infectious disease. 07/19/2019 upon evaluation today patient appears to be doing roughly the same in size there is really no dramatic improvement. He has started back on the Levaquin at this point and though he seems to be doing okay he did still have a lot of blue/green drainage noted on evaluation today unfortunately. I think that this is still indicative more likely of a Pseudomonas infection as previously noted and again he does see Dr. Megan Salon in just a couple of days. I do not know that were really able to effectively clear this with just oral antibiotics alone based on what I am seeing currently. Nonetheless we are still continue to try to manage as best we can with regard to the patient and his wound. I do think the wrap was helpful in decreasing the edema which is excellent news. No fevers, chills, nausea, vomiting, or diarrhea. Electronic Signature(s) Signed: 07/19/2019 1:43:45 PM By: Worthy Keeler PA-C Entered By: Worthy Keeler on 07/19/2019 13:43:45 JANTZEN, PILGER  ([id number]) -------------------------------------------------------------------------------- Physical Exam Details Patient Name: [name] Date of Service: 07/19/2019 8:15 AM Medical Record Number: [id number] Patient Account Number: [id number] Date of Birth/Sex: Sep 28, 1980 (39 y.o. M) Treating RN: Primary Care Provider: [name] Other Clinician: Referring Provider: [name] Treating Provider/Extender: [name], [name] Weeks in Treatment: 21 Constitutional Well-nourished and well-hydrated in no acute distress. Respiratory normal breathing without difficulty. Psychiatric this patient is able to make decisions and demonstrates good insight into disease process. Alert and Oriented x 3. pleasant and cooperative. Notes Upon inspection patient's wound actually showed signs of some good granulation there was also muscle exposure unfortunately at this point the edema is significantly improved though he did still have a lot of blue/green drainage. Actually took pictures of the wound as well as the drainage form today since we are putting him back in a wrap for Dr. Megan Salon to be able to see the patient is can take that with him when he goes for his appointment on Wednesday. Nonetheless I think this is something that may still need further antibiotic therapy possible by IV to try to get things under better control. The patient is in somewhat of an agreement as well. Electronic Signature(s) Signed: 07/19/2019 1:44:31 PM By: Worthy Keeler PA-C Entered By: Worthy Keeler on 07/19/2019 13:44:31 [name], [name] ([id number]) -------------------------------------------------------------------------------- Physician Orders Details Patient Name: [name] Date of Service: 07/19/2019 8:15 AM Medical Record Number: [id number] Patient Account Number: [id number] Date of Birth/Sex: 01-08-1981 (39 y.o. 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 Anesthetic (add to Medication List) Wound #1 Left,Lateral Lower Leg o Topical Lidocaine 4% cream applied to wound bed prior to debridement (In Clinic Only). Skin Barriers/Peri-Wound Care Wound #1 Left,Lateral Lower Leg o Triamcinolone Acetonide Ointment (TCA) Primary Wound Dressing o Other: - Sorbact Secondary Dressing Wound #1 Left,Lateral Lower Leg o XtraSorb Dressing Change Frequency Wound #1 Left,Lateral Lower Leg o Dressing is to be changed Monday and Thursday. Follow-up Appointments Wound #1 Left,Lateral Lower Leg o Return Appointment in 1 week. o Nurse Visit as needed Edema Control Wound #1 Left,Lateral Lower Leg o 3 Layer Compression System - Left Lower Extremity o Elevate legs to the level of the heart and pump ankles as often as possible Medications-please add to medication list. Wound #1 Left,Lateral Lower Leg o P.O. Antibiotics - Continue antibiotics Electronic Signature(s) Signed: 07/19/2019 11:44:39 AM By: Army Melia Signed: 07/19/2019 4:51:53 PM By: Hedy Camara, Raegan ([id number]) Entered By: Army Melia on 07/19/2019 08:56:09 DEMARIO, FANIEL ([id number]) -------------------------------------------------------------------------------- Problem List Details Patient Name: [name] Date of Service: 07/19/2019 8:15 AM Medical Record Number: [id number] Patient Account Number: [id number] Date of Birth/Sex: September 09, 1980 (39 y.o. M) Treating RN: Primary Care Provider: [name] Other Clinician: Referring Provider: [name] Treating Provider/Extender: Melburn Hake, Zani Kyllonen Weeks in Treatment: 136 Active Problems ICD-10 Evaluated Encounter Code Description Active Date Today Diagnosis L97.222 Non-pressure chronic ulcer of left calf with fat layer exposed 12/04/2016 No Yes L88 Pyoderma gangrenosum 03/26/2017 No Yes I87.2 Venous insufficiency (chronic) (peripheral) 12/04/2016 No Yes L03.116 Cellulitis of left lower limb 05/24/2019 No Yes Inactive Problems ICD-10 Code Description Active Date Inactive Date L97.213 Non-pressure chronic ulcer of right calf with necrosis of muscle 04/02/2017 04/02/2017 Resolved Problems Electronic Signature(s) Signed: 07/19/2019 8:18:31 AM By: Worthy Keeler PA-C Entered By: Worthy Keeler on 07/19/2019 08:18:31 Backlund, [name] ([id number]) -------------------------------------------------------------------------------- Progress Note Details Patient Name: [name] Date of Service: 07/19/2019 8:15 AM Medical Record Number: [id number] Patient Account Number: [id number] Date of Birth/Sex: 03-Jul-1980 (39 y.o. M) Treating RN: Primary Care Provider: [name] Other Clinician: Referring Provider: [name] Treating Provider/Extender: Melburn Hake, Uri Covey Weeks in Treatment: 136 Subjective Chief Complaint Information obtained from Patient He is here in follow up evaluation for LLE pyoderma ulcer History of Present Illness (HPI) 12/04/16; 39 year old man who comes into the clinic today for review of a wound on the posterior left calf. He tells me that is been there for about a year. He is not a diabetic he does smoke half a pack per day. He was seen in the ER on 11/20/16 felt to have cellulitis around the wound and was given clindamycin. An x-ray did not show osteomyelitis. The patient initially tells me that he has a milk allergy that sets off a pruritic itching rash on his lower legs which she scratches incessantly and he  thinks that's what may have set up the wound. He has been using various topical antibiotics and ointments without any effect. He works in a [employer] and is on his feet all day. He does not have a prior history of wounds however he does have the rash on both lower legs the right arm and the ventral aspect of his left arm. These are excoriations and clearly have had scratching however there are of macular looking areas on both legs including a substantial larger area on the right leg. This does not have an underlying open area. There is no blistering. The  patient tells me that 2 years ago in [place] in response to the rash on his legs he saw a dermatologist who told him he had a condition which may be pyoderma gangrenosum although I may be putting words into his mouth. He seemed to recognize this. 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 He is not in any treatment for this. He's had no recent travel 12/11/16; the patient arrives today with his wound and roughly the same condition we've been using silver alginate this is a deep punched out wound with some surrounding erythema but no tenderness. Biopsy I did did not show confirmed pyoderma gangrenosum suggested nonspecific inflammation and vasculitis but does not provide an actual description of what was seen by the pathologist. I'm really not able to understand this We have also received information from the patient's dermatologist in [place] notes from April 2016. This was a doctor Agarwal-antal. 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 He was prescribed topical steroid high potency under occlusion which helped but at this point the patient did not have a deep punched out wound. 12/18/16; the patient's wound is larger in terms of surface area however this surface looks better and there is less depth. The surrounding erythema also is better. The patient states that the wrap we put on came off 2  days ago when he has been using his compression stockings. He we are in the process of getting a dermatology consult. 12/26/16 on evaluation today patient's left lower extremity wound shows evidence of infection with surrounding erythema noted. He has been tolerating the dressing changes but states that he has noted more discomfort. There is a larger area of erythema surrounding the wound. No fevers, chills, nausea, or vomiting noted at this time. With that being said the wound still does have slough covering the surface. He is not allergic to any medication that he is aware of at this point. In regard to his right lower extremity he had several regions that are erythematous and pruritic he wonders if there's anything we can do to help that. 01/02/17 I reviewed patient's wound culture which was obtained his visit last week. He was placed on doxycycline at that point. Unfortunately that does not appear to be an antibiotic that would likely help with the situation however the pseudomonas noted on culture is sensitive to Cipro. Also unfortunately patient's wound seems to have a large compared to last week's evaluation. Not severely so but there are definitely increased measurements in general. He is continuing to have discomfort as well he writes this to be a seven out of 10. In fact he would prefer me not to perform any debridement today due to the fact that he is having discomfort and considering he has an active infection on the little reluctant to do so anyway. No fevers, chills, nausea, or vomiting noted at this time. 01/08/17; patient seems dermatology on September 5. I suspect dermatology will want the slides from the biopsy I did sent to their pathologist. I'm not sure if there is a way we can expedite that. In any case the culture I did before I left on vacation 3 weeks ago showed Pseudomonas he was given 10 days of Cipro and per her description of her intake nurses is actually somewhat better this  week although the wound is quite a bit bigger than I remember the last time I saw this. He still has 3 more days of Cipro 01/21/17; dermatology  appointment tomorrow. He has completed the ciprofloxacin for Pseudomonas. Surface of the wound looks better however he is had some deterioration in the lesions on his right leg. Meantime the left lateral leg wound we will continue with sample 01/29/17; patient had his dermatology appointment but I can't yet see that note. He is completed his antibiotics. The wound is more superficial but considerably larger in circumferential area than when he came in. This is in his left lateral calf. He also has swollen erythematous areas with superficial wounds on the right leg and small papular areas on both arms. There apparently areas in her his upper thighs and buttocks I did not look at those. Dermatology biopsied the right leg. Hopefully will have their input next week. 02/05/17; patient went back to see his dermatologist who told him that he had a "scratching problem" as well as staph. He is now on a 30 day course of doxycycline and I believe she gave him triamcinolone cream to the right leg areas to help with the itching [not exactly sure but probably triamcinolone]. She apparently looked at the left lateral leg wound although this was not rebiopsied and I think felt to be ultimately part of the same pathogenesis. He is using sample border foam and changing nevus himself. He now has a new open area on the right posterior leg which was his biopsy site I don't have any of the dermatology notes [name], [name] ([id number]) 02/12/17; we put the patient in compression last week with SANTYL to the wound on the left leg and the biopsy. Edema is much better and the depth of the wound is now at level of skin. Area is still the same Biopsy site on the right lateral leg we've also been using santyl with a border foam dressing and he is changing this himself. 02/19/17; Using silver  alginate started last week to both the substantial left leg wound and the biopsy site on the right wound. He is tolerating compression well. Has a an appointment with his primary M.D. tomorrow wondering about diuretics although I'm wondering if the edema problem is actually lymphedema 02/26/17; the patient has been to see his primary doctor Dr. Jerrel Ivory at [place] our primary care. She started him on Lasix 20 mg and this seems to have helped with the edema. However we are not making substantial change with the left lateral calf wound and inflammation. The biopsy site on the right leg also looks stable but not really all that different. 03/12/17; the patient has been to see vein and vascular Dr. Lucky Cowboy. He has had venous reflux studies I have not reviewed these. I did get a call from his dermatology office. 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 They therefore recommended clobetasol and tetracycline i.e. 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 03/26/17; apparently the patient just had reflux studies not an appointment with Dr. dew. She arrives in clinic today having applied clobetasol for 2-3 weeks. He notes over the last 2-3 days excessive drainage having to change the dressing 3-4 times a day and also expanding erythema. He states the expanding erythema seems to come and go and was last this red was earlier in the month.he is on doxycycline 150 mg  twice a day as an anti- inflammatory systemic therapy for possible pyoderma gangrenosum along with the topical clobetasol 04/02/17; the patient was seen last week by Dr. Lillia Carmel at [facility] dermatology locally who kindly saw him at my request. 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 My concern was the degree of erythema medially extending from his left leg wound which was either inflammation from pyoderma or cellulitis. I put him on Augmentin however culture of the wound showed Pseudomonas which is quinolone sensitive. I really don't believe he has cellulitis however in view of everything I will continue and give him a course of Cipro. He is also on doxycycline as an immune modulator for the pyoderma. In addition to his original wound on the left lateral leg with surrounding erythema he has a wound on the right posterior calf which was an original biopsy site done by dermatology. 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 Saw Dr. Lillia Carmel yesterday. He has been using topical antibiotics to both wound areas his original wound on the left and the biopsies/pathergy area on the right. There is definitely some improvement in the inflammation around the wound on the right although the patient states he has increasing sensitivity of the wounds. He is on prednisone 60 and doxycycline 1 as prescribed by Dr. Lillia Carmel. He is covering the topical antibiotic with gauze and putting this in his own compression stocks and changing this daily. He states that Dr. Lottie Rater did a culture of the left leg wound yesterday 05/07/17; pyoderma gangrenosum. The patient saw Dr. Lillia Carmel yesterday and has a follow-up with her in one month. He is still using topical antibiotics to both wounds although he can't recall exactly what type. He is still on prednisone 60 mg. Dr. Lillia Carmel stated that the doxycycline could stop if we were in agreement. 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 The right medial leg was induced by biopsy/pathergy. The area on the right is  essentially healed. 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 The right medial leg has closed and remains closed. He is still on prednisone 60. He tells me he missed his last dermatology appointment with Dr. Lillia Carmel but will make another appointment. He reports that her blood sugar at a recent screen in [place] was high 200's. He was 180 today. 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 I think he is going to require still much longer prednisone perhaps another 3 months before attempting to taper. In the meantime his wound is a lot better. Smaller. He is cleaning this off daily and applying topical antibiotics. When he was last in the clinic I thought about changing to [facility] and actually put in a couple of calls to dermatology although probably not during their business hours. 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 He continues on oral prednisone. He has been using triple antibiotic ointment. There is surface debris and we will transition to [facility] and have him return in 2 weeks. He has lost 30 pounds since his last appointment with lifestyle modification. He may benefit from topical steroid cream for treatment this  can be considered at a later date. 08/22/17 on evaluation today patient appears to actually be doing rather well in regard to his left lateral lower extremity ulcer. He has actually been managed by Dr. Dellia Nims most recently. Patient is currently on oral steroids at this time. This seems to have been of benefit for him. Nonetheless his last visit was actually with Leah on 08/06/17. Currently he is not utilizing any topical steroid creams although this could be of benefit as well. No fevers, chills, nausea, or vomiting noted at this time. 09/05/17 on evaluation  today patient appears to be doing better in regard to his left lateral lower extremity ulcer. He has been tolerating the dressing changes without complication. He is using Santyl with good effect. Overall I'm very pleased with how things are standing at this point. Patient likewise is happy that this is doing better. 09/19/17 on evaluation today patient actually appears to be doing rather well in regard to his left lateral lower extremity ulcer. 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 He's not having any significant pain. 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 He has been tolerating the Santyl without complication and in general I feel like he's making good progress. 10/17/17 on evaluation today patient appears to be doing very well in regard to his left lateral lower surety ulcer. He has been tolerating the dressing changes without complication. There does not appear to be any evidence of infection he's alternating the Santyl and the triple antibiotic ointment every other day this seems to be doing well for him. 11/03/17 on evaluation today patient appears to be doing very well in regard to his left lateral lower extremity ulcer. He is been tolerating the dressing changes without complication which is good news. Fortunately there does not appear to be any evidence of infection which is also great news. Overall is doing excellent they are starting to taper down on the prednisone is down to 40 mg at this point it also started topical clobetasol for him. 11/17/17 on evaluation today patient appears to be doing well in regard to his left lateral lower surety ulcer. He's been tolerating the dressing changes without complication. He does note that he is having no pain, no excessive drainage or discharge, and overall he  feels like things are going about how he would expect and hope they would. Overall he seems to have no evidence of infection at this time in my opinion which is good news. [name], [name] ([id number]) 12/04/17-He is seen in follow-up evaluation for right lateral lower extremity ulcer. He has been applying topical steroid cream. Today's measurement show slight increase in size. Over the next 2 weeks we will transition to every other day Santyl and steroid cream. He has been encouraged to monitor for changes and notify clinic with any concerns 12/15/17 on evaluation today patient's left lateral motion the ulcer and fortunately is doing worse again at this point. This just since last week to this week has close to doubled in size according to the patient. I did not seeing last week's I do not have a visual to compare this to in our system was also down so we do not have all the charts and at this point. Nonetheless it does have me somewhat concerned in regard to the fact that again he was  worried enough about it he has contact the dermatology that placed them back on the full strength, 50 mg a day of the prednisone that he was taken previous. He continues to alternate using clobetasol along with Santyl at this point. He is obviously somewhat frustrated. 12/22/17 on evaluation today patient appears to be doing a little worse compared to last evaluation. Unfortunately the wound is a little deeper and slightly larger than the last week's evaluation. With that being said he has made some progress in regard to the irritation surrounding at this time unfortunately despite that progress that's been made he still has a significant issue going on here. 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 For that reason I discussed with him today the possibility of perform a wound  culture to ensure there's nothing overtly infected. 01/06/18 on evaluation today patient's wound is larger and deeper than previously evaluated. With that being said it did appear that his wound was infected after my last evaluation with him. Subsequently I did end up prescribing a prescription for Bactrim DS which she has been taking and having no complication with. Fortunately there does not appear to be any evidence of infection at this point in time as far as anything spreading, no want to touch, and overall I feel like things are showing signs of improvement. 01/13/18 on evaluation today patient appears to be even a little larger and deeper than last time. There still muscle exposed in the base of the wound. Nonetheless he does appear to be less erythematous I do believe inflammation is calming down also believe the infection looks like it's probably resolved at this time based on what I'm seeing. No fevers, chills, nausea, or vomiting noted at this time. 01/30/18 on evaluation today patient actually appears to visually look better for the most part. Unfortunately those visually this looks better he does seem to potentially have what may be an abscess in the muscle that has been noted in the central portion of the wound. This is the first time that I have noted what appears to be fluctuance in the central portion of the muscle. With that being said I'm somewhat more concerned about the fact that this might indicate an abscess formation at this location. I do believe that an ultrasound would be appropriate. This is likely something we need to try to do as soon as possible. He has been switch to mupirocin ointment and he is no longer using the steroid ointment as prescribed by dermatology he sees them again next week he's been decreased from 60 to 40 mg of prednisone. 03/09/18 on evaluation today patient actually appears to be doing a little better compared to last time I saw him. There's not as much  erythema surrounding the wound itself. He I did review his most recent infectious disease note which was dated 02/24/18. He saw Dr. Michel Bickers in [place]. 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 Patient is now off of antibiotics and they are continually observing this. There seems to be no change in the past two weeks in my pinion based on what the patient says and what I see today compared to what Dr. Megan Salon likely saw two weeks ago. No fevers, chills, nausea, or vomiting noted at this time.  03/23/18 on evaluation today patient's wound actually appears to be showing signs of improvement which is good news. He is currently still on the Dapsone. He is also working on tapering the prednisone to get off of this and Dr. Lottie Rater is working with him in this regard. Nonetheless overall I feel like the wound is doing well it does appear based on the infectious disease note that I reviewed from Dr. 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 I did also review the results of his ultrasound of left lower extremity which revealed there was a dentist tissue in the base of the wound without an abscess noted. 04/06/18 on evaluation today the patient's left lateral lower extremity ulcer actually appears to be doing fairly well which is excellent news. There does not appear to be any evidence of infection at this time which is also great news. Overall he still does have a significantly large ulceration although little by little he seems to be making progress. He is down to 10 mg a day of the prednisone. 04/20/18 on evaluation today patient actually appears to be doing excellent at this time in regard to his left lower extremity ulcer. He's making signs of good progress unfortunately this is taking much longer  than we would really like to see but nonetheless he is making progress. Fortunately there does not appear to be any evidence of infection at this time. No fevers, chills, nausea, or vomiting noted at this time. The patient has not been using the Santyl due to the cost he hadn't got in this field yet. He's mainly been using the antibiotic ointment topically. Subsequently he also tells me that he really has not been scrubbing in the shower I think this would be helpful again as I told him it doesn't have to be anything too aggressive to even make it believe just enough to keep it free of some of the loose slough and biofilm on the wound surface. 05/11/18 on evaluation today patient's wound appears to be making slow but sure progress in regard to the left lateral lower extremity ulcer. He is been tolerating the dressing changes without complication. Fortunately there does not appear to be any evidence of infection at this time. He is still just using triple antibiotic ointment along with clobetasol occasionally over the area. He never got the Santyl and really does not seem to intend to in my pinion. 06/01/18 on evaluation today patient appears to be doing a little better in regard to his left lateral lower extremity ulcer. He states that overall he does not feel like he is doing as well with the Dapsone as he did with the prednisone. Nonetheless he sees his dermatologist later today and is gonna talk to them about the possibility of going back on the prednisone. Overall again I believe that the wound would be better if you would utilize Santyl but he really does not seem to be interested in going back to the [place] at this point. He has been using triple antibiotic ointment. 06/15/18 on evaluation today patient's wound actually appears to be doing about the same at this point. Fortunately there is no signs of infection at this time. He has made slight improvements although he continues to not really want to  clean the wound bed at this point. He states that he just doesn't mess with it he doesn't want to  cause any problems with everything else he has going on. He has been on medication, antibiotics as prescribed by his dermatologist, for a staff infection of his lower extremities which is really drying out now and looking much better he tells me. Fortunately there is no sign of overall infection. 06/29/18 on evaluation today patient appears to be doing well in regard to his left lateral lower surety ulcer all things considering. Fortunately his staff infection seems to be greatly improved compared to previous. He has no signs of infection and this is drying up quite nicely. He is still the doxycycline [name], [name] ([id number]) for this is no longer on cental, Dapsone, or any of the other medications. His dermatologist has recommended possibility of an infusion but right now he does not want to proceed with that. 07/13/18 on evaluation today patient appears to be doing about the same in regard to his left lateral lower surety ulcer. Fortunately there's no signs of infection at this time which is great news. Unfortunately he still builds up a significant amount of Slough/biofilm of the surface of the wound he still is not really cleaning this as he should be appropriately. Again I'm able to easily with saline and gauze remove the majority of this on the surface which if you would do this at home would likely be a dramatic improvement for him as far as getting the area to improve. Nonetheless overall I still feel like he is making progress is just very slow. I think Santyl will be of benefit for him as well. Still he has not gotten this as of this point. 07/27/18 on evaluation today patient actually appears to be doing little worse in regards of the erythema around the periwound region of the wound he also tells me that he's been having more drainage currently compared to what he was experiencing last time I saw  him. He states not quite as bad as what he had because this was infected previously but nonetheless is still appears to be doing poorly. Fortunately there is no evidence of systemic infection at this point. The patient tells me that he is not going to be able to afford the Santyl. He is still waiting to hear about the infusion therapy with his dermatologist. Apparently she wants an updated colonoscopy first. 08/10/18 on evaluation today patient appears to be doing better in regard to his left lateral lower extremity ulcer. Fortunately he is showing signs of improvement in this regard he's actually been approved for Remicade infusion's as well although this has not been scheduled as of yet. Fortunately there's no signs of active infection at this time in regard to the wound although he is having some issues with infection of the right lower extremity is been seen as dermatologist for this. Fortunately they are definitely still working with him trying to keep things under control. 09/07/18 on evaluation today patient is actually doing rather well in regard to his left lateral lower extremity ulcer. He notes these actually having some hair grow back on his extremity which is something he has not seen in years. He also tells me that the pain is really not giving them any trouble at this time which is also good news overall she is very pleased with the progress he's using a combination of the mupirocin along with the probate is all mixed. 09/21/18 on evaluation today patient actually appears to be doing fairly well all things considered in regard to his looks from the ulcer. He's been tolerating  the dressing changes without complication. Fortunately there's no signs of active infection at this time which is good news he is still on all antibiotics or prevention of the staff infection. He has been on prednisone for time although he states it is gonna contact his dermatologist and see if she put them on a short  course due to some irritation that he has going on currently. Fortunately there's no evidence of any overall worsening this is going very slow I think cental would be something that would be helpful for him although he states that $50 for tube is quite expensive. He therefore is not willing to get that at this point. 10/06/18 on evaluation today patient actually appears to be doing decently well in regard to his left lateral leg ulcer. He's been tolerating the dressing changes without complication. Fortunately there's no signs of active infection at this time. Overall I'm actually rather pleased with the progress he's making although it's slow he doesn't show any signs of infection and he does seem to be making some improvement. I do believe that he may need a switch up and dressings to try to help this to heal more appropriately and quickly. 10/19/18 on evaluation today patient actually appears to be doing better in regard to his left lateral lower extremity ulcer. This is shown signs of having much less Slough buildup at this point due to the fact he has been using the [employer]. Obviously this is very good news. The overall size of the wound is not dramatically smaller but again the appearance is. 11/02/18 on evaluation today patient actually appears to be doing quite well in regard to his lower [place] ulcer. A lot of the skin around the ulcer is actually somewhat irritating at this point this seems to be more due to the dressing causing irritation from the adhesive that anything else. Fortunately there is no signs of active infection at this time. 11/24/18 on evaluation today patient appears to be doing a little worse in regard to his overall appearance of his lower extremity ulcer. There's more erythema and warmth around the wound unfortunately. He is currently on doxycycline which he has been on for some time. With that being said I'm not sure that seems to be helping with what appears to possibly be an  acute cellulitis with regard to his left lower extremity ulcer. No fevers, chills, nausea, or vomiting noted at this time. 12/08/18 on evaluation today patient's wounds actually appears to be doing significantly better compared to his last evaluation. He has been using Santyl along with alternating tripling about appointment as well as the steroid cream seems to be doing quite well and the wound is showing signs of improvement which is excellent news. Fortunately there's no evidence of infection and in fact his culture came back negative with only normal skin flora noted. 12/21/2018 upon evaluation today patient actually appears to be doing excellent with regard to his ulcer. This is actually the best that I have seen it since have been helping to take care of him. It is both smaller as well as less slough noted on the surface of the wound and seems to be showing signs of good improvement with new skin growing from the edges. He has been using just the triamcinolone he does wonder if he can get a refill of that ointment today. 01/04/2019 upon evaluation today patient actually appears to be doing well with regard to his left lateral lower extremity ulcer. With that being said it  does not appear to be that he is doing quite as well as last time as far as progression is concerned. There does not appear to be any signs of infection or significant irritation which is good news. With that being said I do believe that he may benefit from switching to a collagen based dressing based on how clean The wound appears. 01/18/2019 on evaluation today patient actually appears to be doing well with regard to his wound on the left lower extremity. He is not made a lot of progress compared to where we were previous but nonetheless does seem to be doing okay at this time which is good news. There is no signs of active infection which is also good news. My only concern currently is I do wish we can get him into utilizing the  collagen dressing his insurance would not pay for the supplies that we ordered although it appears that he may be able to order this through his supply company that he typically utilizes. This is [employer]. Nonetheless he did try to order it during the office visit today and it appears this did go through. We will see if he can get that it is a different brand but nonetheless he has collagen and I do think will be beneficial. [name], [name] ([id number]) 02/01/2019 on evaluation today patient actually appears to be doing a little worse today in regard to the overall size of his wounds. Fortunately there is no signs of active infection at this time. That is visually. Nonetheless when this is happened before it was due to infection. For that reason were somewhat concerned about that this time as well. 02/08/2019 on evaluation today patient unfortunately appears to be doing slightly worse with regard to his wound upon evaluation today. Is measuring a little deeper and a little larger unfortunately. I am not really sure exactly what is causing this to enlarge he actually did see his dermatologist she is going to see about initiating Humira for him. Subsequently she also did do steroid injections into the wound itself in the periphery. Nonetheless still nonetheless he seems to be getting a little bit larger he is gone back to just using the steroid cream topically which I think is appropriate. I would say hold off on the collagen for the time being is definitely a good thing to do. Based on the culture results which we finally did get the final result back regarding it shows staph as the bacteria noted again that can be a normal skin bacteria based on the fact however he is having increased drainage and worsening of the wound measurement [name] I would go ahead and place him on an antibiotic today I do believe for this. 02/15/2019 on evaluation today patient actually appears to be doing somewhat better in regard to  his ulcer. There is no signs of worsening at this time I did review his culture results which showed evidence of Staphylococcus aureus but not MRSA. Again this could just be more related to the normal skin bacteria although he states the drainage has slowed down quite a bit he may have had a mild infection not just colonization. And was much smaller and then since around10/04/2019 on evaluation today patient appears to be doing unfortunately worse as far as the size of the wound. I really feel like that this is steadily getting larger again it had been doing excellent right at the beginning of September we have seen a steady increase in the area of the wound  it is almost 2-1/2 times the size it was on September 1. Obviously this is a bad trend this is not wanting to see. For that reason we went back to using just the topical triamcinolone cream which does seem to help with inflammation. I checked him for bacteria by way of culture and nothing showed positive there. I am considering giving him a short course of a tapering steroid Dosepak today to see if that is can be beneficial for him. The patient is in agreement with giving that a try. 03/08/2019 on evaluation today patient appears to be doing very well in comparison to last evaluation with regard to his lower extremity ulcer. This is showing signs of less inflammation and actually measuring slightly smaller compared to last time every other week over the past month and a half he has been measuring larger larger larger. Nonetheless I do believe that the issue has been inflammation the prednisone does seem to have been beneficial for him which is good news. No fevers, chills, nausea, vomiting, or diarrhea. 03/22/2019 on evaluation today patient appears to be doing about the same with regard to his leg ulcer. He has been tolerating the dressing changes without complication. With that being said the wound seems to be mostly arrested at its current size  but really is not making any progress except for when we prescribed the prednisone. He did show some signs of dropping as far as the overall size of the wound during that interval week. Nonetheless this is something he is not on long-term at this point and unfortunately I think he is getting need either this or else the Humira which his dermatologist has discussed try to get approval for. With that being said he will be seeing his dermatologist on the 11th of this month that is November. 04/19/2019 on evaluation today patient appears to be doing really about the same the wound is measuring slightly larger compared to last time I saw him. He has not been into the office since November 2 due to the fact that he unfortunately had Covid as that his entire family. He tells me that it was rough but they did pull-through and he seems to be doing much better. Fortunately there is no signs of active infection at this time. No fevers, chills, nausea, vomiting, or diarrhea. 05/10/2019 on evaluation today patient unfortunately appears to be doing significantly worse as compared to last time I saw him. He does tell me that he has had his first dose of Humira and actually is scheduled to get the next one in the upcoming week. With that being said he tells me also that in the past several days he has been having a lot of issues with green drainage she showed me a picture this is more blue-green in color. He is also been having issues with increased sloughy buildup and the wound does appear to be larger today. Obviously this is not the direction that we want everything to take based on the starting of his Humira. Nonetheless I think this is definitely a result of likely infection and to be honest I think this is probably Pseudomonas causing the infection based on what I am seeing. 05/24/2019 on evaluation today patient unfortunately appears to be doing significantly worse compared to his prior evaluation with me 2 weeks  ago. I did review his culture results which showed that he does have Staph aureus as well as Pseudomonas noted on the culture. Nonetheless the Levaquin that I prescribed for  him does not appear to have been appropriate and in fact he tells me he is no longer experiencing the green drainage and discharge that he had at the last visit. Fortunately there is no signs of active infection at this time which is good news although the wound has significantly worsened it in fact is much deeper than it was previous. We have been utilizing up to this point triamcinolone ointment as the prescription topical of choice but at this time I really feel like that the wound is getting need to be packed in order to appropriately manage this due to the deeper nature of the wound. Therefore something along the lines of an alginate dressing may be more appropriate. 05/31/2019 upon inspection today patient's wound actually showed signs of doing poorly at this point. Unfortunately he just does not seem to be making any good progress despite what we have tried. He actually did go ahead and pick up the Cipro and start taking that as he was noticing more green drainage he had previously completed the Levaquin that I prescribed for him as well. Nonetheless he missed his appointment for the seventh last week on Wednesday with the wound care center and [facility] where his dermatologist referred him. Obviously I do think a second opinion would be helpful at this point especially in light of the fact that the patient seems to be doing so poorly despite the fact that we have tried everything that I really know how at this point. The only thing that ever seems to have helped him in the past is when he was on high doses of continual steroids that did seem to make a difference for him. Right now he is on immune modulating medication to try to help with the pyoderma but I am not sure that he is getting as much relief at this point as  he is previously obtained from the use of steroids. 06/07/2019 upon evaluation today patient unfortunately appears to be doing worse yet again with regard to his wound. In fact I am starting to question whether or not he may have a fluid pocket in the muscle at this point based on the bulging and the soft appearance to the central portion of the muscle area. There is not anything draining from the muscle itself at this time which is good news but nonetheless the wound is expanding. I am not really seeing any results of the Humira as far as overall wound progression based on what I am seeing at this point. The patient has been referred for second opinion with regard to his wound to the [facility] wound care center by his dermatologist which I definitely am not in opposition to. Unfortunately we tried multiple dressings in the past including collagen, alginate, and at one point even Hydrofera Blue. With that being said he is never really used it for any significant amount of time due to the fact that he often complains of pain associated with these dressings and then will go back to either using the Santyl which she has done intermittently or more frequently the triamcinolone. He is also using his own compression stockings. We have wrapped him in the past but again that was something else that he really was not a big fan of. 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 He seemed to do much better when he was on the steroids although honestly I understand that the steroids are not necessarily the best medication to be on long-term obviously 06/14/2019 on evaluation  today patient appears to be doing actually a little bit better with regard to the overall appearance with his leg. Unfortunately he does continue to have issues with what appears to be some fluid underneath the muscle although he did see the wound specialty center at [facility] last week their main goals were to see about infusion therapy in place of the Humira as they feel like that is not quite strong enough. They also recommended that we continue with the treatment otherwise as we are they felt like that was appropriate and they are okay with him continuing to follow-up here with [place] in that regard. With that being said they are also sending him to the vein specialist there to see about vein stripping and if that would be of benefit for him. Subsequently they also did not really address whether or not an ultrasound of the muscle area to see if there is anything that needs to be addressed here would be appropriate or not. For that reason I discussed this with him last week I think we may proceed down that road at this point. 06/21/2019 upon evaluation today patient's wound actually appears to be doing slightly better compared to previous evaluations. I do believe that he has made a difference with regard to the progression here with the use of oral steroids. Again in the past has been the only thing that is really calm things down. He does tell me that from [facility] is gotten a good news from there that there are no further vein stripping that is necessary at this point. I do not have that available for review today although the patient did relay this to me. He also did obtain and have the ultrasound of the wound completed which I did sign off on today. It does appear that there is no fluid collection under the muscle this is likely then just edematous tissue in general. That is also good news. Overall I still believe the inflammation is the main issue here. He did inquire about the possibility of a wound VAC again  with the muscle protruding like it is I am not really sure whether the wound VAC is necessarily ideal or not. That is something we will have to consider although I do believe he may need compression wrapping to try to help with edema control which could potentially be of benefit. 06/28/2019 on evaluation today patient appears to be doing slightly better measurement [name] although this is not terribly smaller he least seems to be trending towards that direction. With that being said he still seems to have purulent drainage noted in the wound bed at this time. He has been on Levaquin followed by Cipro over the past month. Unfortunately he still seems to have some issues with active infection at this time. I did perform a culture last week in order to evaluate and see if indeed there was still anything going on. Subsequently the culture did come back showing Pseudomonas which is consistent with the drainage has been having which is blue-green in color. He also has had an odor that again was somewhat consistent with  Pseudomonas as well. Long story short it appears that the culture showed an intermediate finding with regard to how well the Cipro will work for the Pseudomonas infection. Subsequently being that he does not seem to be clearing up and at best what we are doing is just keeping this at [place] I think he may need to see infectious disease to discuss IV antibiotic options. 07/05/2019 upon evaluation today patient appears to be doing okay in regard to his leg ulcer. He has been tolerating the dressing changes at this point without complication. Fortunately there is no signs of active infection at this time which is good news. No fevers, chills, nausea, vomiting, or diarrhea. With that being said he does have an appointment with infectious disease tomorrow and his primary care on Wednesday. Again the reason for the infectious disease referral was due to the fact that he did not seem to be fully resolving  with the use of oral antibiotics and therefore we were thinking that IV antibiotic therapy may be necessary secondary to the fact that there was an intermediate finding for how effective the Cipro may be. Nonetheless again he has been having a lot of purulent and even green drainage. Fortunately right now that seems to have calmed down over the past week with the reinitiation of the oral antibiotic. Nonetheless we will see what Dr. Megan Salon has to say. 07/12/2019 upon evaluation today patient appears to be doing about the same at this point in regard to his left lower extremity ulcer. Fortunately there is no signs of active infection at this time which is good news I do believe the Levaquin has been beneficial I did review Dr. Hale Bogus note and to be honest I agree that the patient's leg does appear to be doing better currently. What we found in the past as he does not seem to really completely resolve he will stop the antibiotic and then subsequently things will revert back to having issues with blue-green drainage, increased pain, and overall worsening in general. Obviously that is the reason I sent him back to infectious disease. 07/19/2019 upon evaluation today patient appears to be doing roughly the same in size there is really no dramatic improvement. He has started back on the Levaquin at this point and though he seems to be doing okay he did still have a lot of blue/green drainage noted on evaluation today unfortunately. I think that this is still indicative more likely of a Pseudomonas infection as previously noted and again he does see Dr. Megan Salon in just a couple of days. I do not know that were really able to effectively clear this with just oral antibiotics alone based on what I am seeing currently. Nonetheless we are still continue to try to manage as best we can with regard to the patient and his wound. I do think the wrap was helpful in decreasing the edema which is excellent news. No  fevers, chills, nausea, vomiting, or diarrhea. Objective Constitutional Well-nourished and well-hydrated in no acute distress. Vitals Time Taken: 8:20 AM, Height: 71 in, Weight: 338 lbs, BMI: 47.1, Temperature: 98.8 F, Pulse: 80 bpm, Respiratory Rate: 18 breaths/min, Blood Pressure: 125/64 mmHg. Respiratory Dain, Pape ([id number]) normal breathing without difficulty. Psychiatric this patient is able to make decisions and demonstrates good insight into disease process. Alert and Oriented x 3. pleasant and cooperative. General Notes: Upon inspection patient's wound actually showed signs of some good granulation there was also muscle exposure unfortunately at this point the edema is significantly  improved though he did still have a lot of blue/green drainage. Actually took pictures of the wound as well as the drainage form today since we are putting him back in a wrap for Dr. Megan Salon to be able to see the patient is can take that with him when he goes for his appointment on Wednesday. Nonetheless I think this is something that may still need further antibiotic therapy possible by IV to try to get things under better control. The patient is in somewhat of an agreement as well. Integumentary (Hair, Skin) Wound #1 status is Open. Original cause of wound was Gradually Appeared. The wound is located on the Left,Lateral Lower Leg. The wound measures 6cm length x 6.3cm width x 0.9cm depth; 29.688cm^2 area and 26.719cm^3 volume. There is muscle and Fat Layer (Subcutaneous Tissue) Exposed exposed. There is no tunneling or undermining noted. There is a medium amount of serous drainage noted. The wound margin is epibole. There is medium (34-66%) pink granulation within the wound bed. There is a medium (34-66%) amount of necrotic tissue within the wound bed including Adherent Slough. 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 There was a Three Layer Compression Therapy Procedure by Army Melia, RN. Post procedure Diagnosis Wound #1: Same as Pre-Procedure Plan Wound Cleansing: Wound #1 Left,Lateral Lower Leg: Clean wound with Normal Saline. Anesthetic (add to Medication List): Wound #1 Left,Lateral Lower Leg: Topical Lidocaine 4% cream applied to wound bed prior to debridement (In Clinic Only). Skin Barriers/Peri-Wound Care: Wound #1 Left,Lateral Lower Leg: Triamcinolone Acetonide Ointment (TCA) Primary Wound Dressing: Other: - Sorbact Secondary Dressing: Wound #1 Left,Lateral Lower Leg: XtraSorb Dressing Change Frequency: Wound #1 Left,Lateral Lower Leg: Dressing is to be changed Monday and Thursday. Follow-up Appointments: JALIN, ALICEA ([id number]) Wound #1 Left,Lateral Lower Leg: Return Appointment in 1 week. Nurse Visit as needed Edema Control: Wound #1 Left,Lateral Lower Leg: 3 Layer Compression System - Left Lower Extremity Elevate legs to the level of the heart and pump ankles as often as possible Medications-please add to medication list.: Wound #1 Left,Lateral Lower Leg: P.O. Antibiotics - Continue antibiotics 1. My suggestion at this time is can be that the patient continue with the Levaquin that I did prescribe for him after his request last week during the nurse visit wrap change this was in order to keep the infection at [place] as things started to seem to creep back up and get worse at that point. 2. I am also giving suggested continuation of the 3 layer compression wrap which seems to be doing fairly well for the patient. 3. I am also can recommend continued elevation is much as he can. I understand he does work full duty but at the same time when he is not working I think at home he does  need to try to elevate as much as possible. We will see patient back for reevaluation in 1 week here in the clinic. If anything worsens or changes patient will contact our office for additional recommendations. Electronic Signature(s) Signed: 07/19/2019 1:45:17 PM By: Worthy Keeler PA-C Entered By: Worthy Keeler on 07/19/2019 13:45:17 Klahn, [name] ([id number]) -------------------------------------------------------------------------------- SuperBill Details Patient Name: [name] Date of Service: 07/19/2019 Medical Record Number: [id number] Patient Account Number: [id number] Date of Birth/Sex: 18-Mar-1981 (39 y.o. M) Treating RN: Nicki Reaper,  [place] Primary Care Provider: [name] Other Clinician: Referring Provider: [name] Treating Provider/Extender: Melburn Hake, Hridaan Bouse Weeks in Treatment: 136 Diagnosis Coding ICD-10 Codes Code Description [phone] Non-pressure chronic ulcer of left calf with fat layer exposed L88 Pyoderma gangrenosum I87.2 Venous insufficiency (chronic) (peripheral) L03.116 Cellulitis of left lower limb Facility Procedures CPT4 Code: [id number] Description: (Facility Use Only) [phone] - [place] LWR LT LEG Modifier: Quantity: 1 Physician Procedures CPT4 Code: 5427062 Description: 37628 - WC PHYS LEVEL 3 - EST PT Modifier: Quantity: 1 CPT4 Code: Description: ICD-10 Diagnosis Description L97.222 Non-pressure chronic ulcer of left calf with fat layer exposed L88 Pyoderma gangrenosum I87.2 Venous insufficiency (chronic) (peripheral) L03.116 Cellulitis of left lower limb Modifier: Quantity: Electronic Signature(s) Signed: 07/19/2019 1:45:38 PM By: Worthy Keeler PA-C Previous Signature: 07/19/2019 11:44:39 AM Version By: Army Melia Entered By: Worthy Keeler on 07/19/2019 13:45:37

## 2019-07-20 ENCOUNTER — Telehealth: Payer: Self-pay

## 2019-07-20 NOTE — Telephone Encounter (Signed)
COVID-19 Pre-Screening Questions:07/20/19  Do you currently have a fever (>100 F), chills or unexplained body aches? NO   Are you currently experiencing new cough, shortness of breath, sore throat, runny nose?NO  .  Have you recently travelled outside the state of [place] in the last 14 days?NO  .  Have you been in contact with someone that is currently pending confirmation of Covid19 testing or has been confirmed to have the [employer] virus?  NO   **If the patient answers NO to ALL questions -  advise the patient to please call the clinic before coming to the office should any symptoms develop.

## 2019-07-21 ENCOUNTER — Other Ambulatory Visit: Payer: Self-pay

## 2019-07-21 ENCOUNTER — Ambulatory Visit (INDEPENDENT_AMBULATORY_CARE_PROVIDER_SITE_OTHER): Payer: 59 | Admitting: Internal Medicine

## 2019-07-21 ENCOUNTER — Encounter: Payer: Self-pay | Admitting: Internal Medicine

## 2019-07-21 DIAGNOSIS — L88 Pyoderma gangrenosum: Secondary | ICD-10-CM

## 2019-07-21 NOTE — Progress Notes (Signed)
      [facility]  Reason for Consult: Possible infection of chronic left leg wound Referring Provider: Hoyt Stone, PA-C  Assessment: [name]'s chronic left leg ulcer has failed to heal despite all recent treatments.  It is now about 50% larger than when I last saw him in December 2019.  It is hard to know if his positive cultures reflect active infection or simple colonization.  Although his most recent isolate is intermediately resistant to fluoroquinolone antibiotics he has had clinical improvement on levofloxacin.  I will have him complete his therapy and follow-up here in 2 weeks.  Plan: 1. Complete levofloxacin 2. Follow-up here in 2 weeks  Patient Active Problem List   Diagnosis Date Noted  . 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 Normocytic anemia 07/07/2019  . Former cigarette smoker 06/17/2018  . Ulcerative colitis (HCC) 03/05/2018  . Venous stasis 02/24/2018  . Chronic pain of left knee 12/05/2017  . Chronic pain of left ankle 12/05/2017  . Type 2 diabetes mellitus (HCC) 07/15/2017  . Seasonal allergic rhinitis 11/25/2016  . Depression 04/17/2015  . Hyperlipidemia 04/17/2015    Patient's Medications  New Prescriptions   No medications on file  Previous Medications   ACETAMINOPHEN (TYLENOL) 500 MG TABLET    Take 1,000 mg by mouth every 6 (six) hours as needed for headache (pain).   ATORVASTATIN (LIPITOR) 40 MG TABLET    Take 1 tablet (40 mg total) by mouth every evening. For cholesterol.   BLOOD GLUCOSE METER KIT AND SUPPLIES KIT    Dispense based on patient and insurance preference. Use up to four times daily as directed. (FOR ICD-9 250.00, 250.01).   CLOBETASOL OINTMENT (TEMOVATE) 0.05 %    Apply 1 application topically 2 (two) times daily.    FLUTICASONE (FLONASE) 50 MCG/ACT NASAL SPRAY    Place 1 spray into both nostrils 2 (two) times daily.   HUMIRA PEN 40 MG/0.8ML PNKT    Inject 1 Syringe into the skin every 14  (fourteen) days.   IBUPROFEN (ADVIL,MOTRIN) 200 MG TABLET    Take 400-600 mg by mouth every 6 (six) hours as needed for headache (pain).   INSULIN GLARGINE (LANTUS SOLOSTAR) 100 UNIT/ML SOLOSTAR PEN    Inject 15 Units into the skin at bedtime.   INSULIN PEN NEEDLE (PEN NEEDLES) 31G X 6 MM MISC    Use nightly with insulin   METFORMIN (GLUCOPHAGE) 1000 MG TABLET    Take 1 tablet (1,000 mg total) by mouth 2 (two) times daily with a meal. For diabetes.   MUPIROCIN OINTMENT (BACTROBAN) 2 %    Apply topically two (2) 60g times a day. Mix with clobetasol and apply twice daily   PREDNISONE (STERAPRED UNI-PAK 48 TAB) 10 MG (48) TBPK TABLET       VENLAFAXINE XR (EFFEXOR-XR) 150 MG 24 HR CAPSULE    TAKE 1 CAPSULE BY MOUTH ONCE DAILY WITH BREAKFAST   VENLAFAXINE XR (EFFEXOR-XR) 37.5 MG 24 HR CAPSULE    Take 1 capsule (37.5 mg total) by mouth daily with breakfast. For depression  Modified Medications   No medications on file  Discontinued Medications   DOXYCYCLINE (VIBRA-TABS) 100 MG TABLET    Take 100 mg by mouth 2 (two) times daily.    LEVOFLOXACIN (LEVAQUIN) 750 MG TABLET    Take 750 mg by mouth daily.    HPI: [name] is a 39 y.o. male with a history of ulcerative colitis and lower   extremity venous stasis who has had a chronic left lower leg wound for many years.  He has been followed by [facility] dermatology with a diagnosis of pyoderma gangrenosum.  He has been on multiple treatments over the past several years but has never been able to completely heal the wound.  He has been on intermittent steroid therapy.  He was treated with dapsone 2 years ago.  He started infliximab several months ago.  He has been followed at the wound center for many years.  He has been colonized, infected with multiple organisms.  He has been treated for possible MRSA superinfection on several occasions and has been on chronic doxycycline therapy.  A wound culture obtained last September grew MSSA resistant to tetracyclines.  In  late December a wound culture grew Pseudomonas sensitive to all antibiotics tested and MSSA.  He was treated with ciprofloxacin.  He recalls that he did not note much improvement in his wound or wound drainage.  He developed more drainage several weeks ago that was darker brown in color.  A wound culture on 06/21/2019 grew Pseudomonas again that was intermediately resistant to ciprofloxacin.  He started on levofloxacin and noted modest improvement in the drainage.   I saw him on 07/06/2019.  Since he seemed to be doing a little bit better asked him to complete his levofloxacin.  He was off of it for about 3 days and then went back to see [name] at the wound center.  He was still having greenish-gray drainage (he has pictures).  No new cultures were obtained.  Levofloxacin was restarted and he was placed in a compression wrap.  Review of Systems: Review of Systems  Constitutional: Negative for chills, fever and weight loss.  Gastrointestinal: Negative for abdominal pain, diarrhea, nausea and vomiting.  Skin: Negative for rash.       As noted in HPI.      Past Medical History:  Diagnosis Date  . Blood in stool   . Depression   . Elevated blood pressure   . Hyperlipidemia   . OSA (obstructive sleep apnea) 2014  . Seasonal allergies   . Ulcerative colitis ([place]) 03/05/2018    Social History   Tobacco Use  . Smoking status: Former Smoker    Packs/day: 2.00    Years: 14.00    Pack years: 28.00    Types: Cigarettes    Quit date: 11/25/2018    Years since quitting: 0.6  . Smokeless tobacco: Former Systems developer    Quit date: 03/17/2015  Substance Use Topics  . Alcohol use: Yes    Alcohol/week: 0.0 standard drinks    Comment: soical  . Drug use: Not on file    Family History  Problem Relation Age of Onset  . Alcohol abuse Paternal Aunt   . Alcohol abuse Paternal Uncle   . Stroke Paternal Uncle   . Hyperlipidemia Maternal Grandfather   . Hypertension Maternal Grandfather   . Diabetes  Maternal Grandfather   . Alcohol abuse Maternal Grandfather   . Multiple sclerosis Mother   . Dementia Mother    Allergies  Allergen Reactions  . Biaxin [Clarithromycin] Other (See Comments)    Causes colitis flares  . Milk-Related Compounds Hives    OBJECTIVE: Vitals:   07/21/19 1445  BP: 111/79  Pulse: (!) 105  Weight: (!) 339 lb (153.8 kg)   Body mass index is 47.28 kg/m.   Physical Exam Constitutional:      Appearance: He is not ill-appearing.  Comments: He is pleasant and in no distress.  Musculoskeletal:        General: No swelling or tenderness.  Skin:    Comments: He has pictures of his large chronic appearing ulcer on his left lateral calf from his last visit at the wound center last week.  It appears unchanged from 2 weeks ago.  Neurological:     General: No focal deficit present.  Psychiatric:        Mood and Affect: Mood normal.     Microbiology: No results found for this or any previous visit (from the past 240 hour(s)).  Michel Bickers, MD [facility] [place] [facility] [phone] pager   [phone] cell 07/21/2019, 3:54 PM

## 2019-07-21 NOTE — Assessment & Plan Note (Signed)
He is making little progress with his multifactorial chronic left leg ulcer.  I doubt that chronic doxycycline is doing him any good and he does not seem to have gotten much benefit from his recent levofloxacin.  I discussed management options with him today and have recommended stopping both antibiotics now.  If his wound drainage persist I will ask Dr. Dellia Nims to obtained swab cultures next week to guide future antibiotic therapy.  If he grows the same Pseudomonas again he will need to consider PICC placement and IV antibiotic therapy.  He is willing to do that.  He will follow-up with me in 1 to 2 weeks.

## 2019-07-22 DIAGNOSIS — L97222 Non-pressure chronic ulcer of left calf with fat layer exposed: Secondary | ICD-10-CM | POA: Diagnosis not present

## 2019-07-23 DIAGNOSIS — E119 Type 2 diabetes mellitus without complications: Secondary | ICD-10-CM

## 2019-07-23 MED ORDER — LANTUS SOLOSTAR 100 UNIT/ML ~~LOC~~ SOPN: 20.0000 [IU] | PEN_INJECTOR | Freq: Every day | SUBCUTANEOUS | 2 refills | Status: DC

## 2019-07-23 NOTE — Progress Notes (Signed)
[name] ([id number]) Visit Report for 07/12/2019 Arrival Information Details Patient Name: [name], [name] Date of Service: 07/12/2019 8:15 AM Medical Record Number: [id number] Patient Account Number: [id number] Date of Birth/Sex: 05-01-1981 (39 y.o. M) Treating RN: Montey Hora Primary Care Tarick Parenteau: [name] Other Clinician: Referring Deby Adger: [name] Treating Carrel Leather/Extender: Melburn Hake, [name]: 67 Visit Information History Since Last Visit Added or deleted any medications: No Patient Arrived: Ambulatory Any new allergies or adverse reactions: No Arrival Time: 08:29 Had a fall or experienced change in No Accompanied By: self activities of daily living that may affect Transfer Assistance: None risk of falls: Patient Identification Verified: Yes Signs or symptoms of abuse/neglect since last visito No Secondary Verification Process Completed: Yes Hospitalized since last visit: No Patient Requires Transmission-Based Precautions: No Implantable device outside of the clinic excluding No Patient Has Alerts: No cellular tissue based products placed in the center since last visit: Has Dressing in Place as Prescribed: Yes Has Compression in Place as Prescribed: Yes Pain Present Now: Yes Electronic Signature(s) Signed: 07/12/2019 4:53:18 PM By: Montey Hora Entered By: Montey Hora on 07/12/2019 08:29:45 [name] ([id number]) -------------------------------------------------------------------------------- Compression Therapy Details Patient Name: [name] Date of Service: 07/12/2019 8:15 AM Medical Record Number: [id number] Patient Account Number: [id number] Date of Birth/Sex: 11-03-1980 (39 y.o. M) Treating RN: [name] Primary Care Aaryav Hopfensperger: [name] Other Clinician: Referring Caidin Heidenreich: [name] Treating Hafsah Hendler/Extender: Melburn Hake, [name]: 135 Compression Therapy Performed for Wound  Assessment: Wound #1 Left,Lateral Lower Leg Performed By: Clinician [name], RN Compression Type: Three Layer Pre Treatment ABI: 1.2 Post Procedure Diagnosis Same as Pre-procedure Electronic Signature(s) Signed: 07/23/2019 5:40:44 PM By: [name], [name], RN, [name], [name], [name] Entered By: [name], [name], RN, [name], [name] on 07/12/2019 08:44:55 CLAUDIS, GIOVANELLI ([id number]) -------------------------------------------------------------------------------- Encounter Discharge Information Details Patient Name: [name] Date of Service: 07/12/2019 8:15 AM Medical Record Number: [id number] Patient Account Number: [id number] Date of Birth/Sex: January 11, 1981 (39 y.o. M) Treating RN: [name] Primary Care Carnell Casamento: [name] Other Clinician: Referring Jeral Zick: [name] Treating Tzippy Testerman/Extender: Melburn Hake, [name]: 135 Encounter Discharge Information Items Discharge Condition: Stable Ambulatory Status: Ambulatory Discharge Destination: Home Transportation: Private Auto Accompanied By: self Schedule Follow-up Appointment: Yes Clinical Summary of Care: Electronic Signature(s) Signed: 07/23/2019 5:40:44 PM By: [name], [name], RN, [name], [name], [name] Entered By: [name], [name], RN, [name], [name] on 07/12/2019 08:49:31 [name] ([id number]) -------------------------------------------------------------------------------- Lower Extremity Assessment Details Patient Name: [name] Date of Service: 07/12/2019 8:15 AM Medical Record Number: [id number] Patient Account Number: [id number] Date of Birth/Sex: August 19, 1980 (39 y.o. M) Treating RN: Montey Hora Primary Care Daniela Hernan: [name] Other Clinician: Referring Maribel Hadley: [name] Treating Leopold Smyers/Extender: Melburn Hake, [name]: 135 Edema Assessment Assessed: [Left: No] [Right: No] Edema: [Left: Ye] [Right: s] Electronic Signature(s) Signed: 07/12/2019 4:53:18 PM By: Montey Hora Entered By: Montey Hora on 07/12/2019 08:37:05 [name] ([id number]) -------------------------------------------------------------------------------- Multi Wound Chart Details Patient Name: [name] Date of Service: 07/12/2019 8:15 AM Medical Record Number: [id number] Patient Account Number: [id number] Date of Birth/Sex: December 10, 1980 (39 y.o. M) Treating RN: [name] Primary Care Deadrian Toya: [name] Other Clinician: Referring Shayan Bramhall: [name] Treating Cleota Pellerito/Extender: Melburn Hake, [name]: 135 Vital Signs Height(in): 71 Pulse(bpm): 84 Weight(lbs): 338 Blood Pressure(mmHg): 126/74 Body Mass Index(BMI): 47 Temperature(F): 98.4 Respiratory Rate(breaths/min): 18 Photos: [N/A:N/A] Wound Location: Left Lower Leg - Lateral N/A N/A Wounding Event: Gradually Appeared N/A N/A Primary Etiology: Pyoderma N/A N/A Comorbid History:  Sleep Apnea, Hypertension, Colitis N/A N/A Date Acquired: 11/18/2015 N/A N/A Weeks of Treatment: 135 N/A N/A Wound Status: Open N/A N/A Measurements L x W x D (cm) 5.7x6x0.6 N/A N/A Area (cm) : 26.861 N/A N/A Volume (cm) : 16.116 N/A N/A % Reduction in Area: -447.20% N/A N/A % Reduction in Volume: -310.40% N/A N/A Classification: Full Thickness With Exposed N/A N/A Support Structures Exudate Amount: Medium N/A N/A Exudate Type: Serous N/A N/A Exudate Color: amber N/A N/A Wound Margin: Distinct, outline attached N/A N/A Granulation Amount: Medium (34-66%) N/A N/A Granulation Quality: Pink N/A N/A Necrotic Amount: Medium (34-66%) N/A N/A Exposed Structures: Fat Layer (Subcutaneous Tissue) N/A N/A Exposed: Yes Muscle: Yes Fascia: No Tendon: No Joint: No Bone: No Epithelialization: None N/A N/A Treatment Notes Electronic Signature(s) Signed: 07/23/2019 5:40:44 PM By: [name], [name], RN, [name], [name], [name] Entered By: [name], [name], RN, [name], [name] on 07/12/2019 08:44:28 GARRY, NICOLINI ([id number]) DEZ, STAUFFER  ([id number]) -------------------------------------------------------------------------------- Multi-Disciplinary Care Plan Details Patient Name: [name] Date of Service: 07/12/2019 8:15 AM Medical Record Number: [id number] Patient Account Number: [id number] Date of Birth/Sex: Dec 28, 1980 (39 y.o. M) Treating RN: [name] Primary Care Alaycia Eardley: [name] Other Clinician: Referring [name] Oelkers: [name] Treating Arian Mcquitty/Extender: Melburn Hake, [name]: 135 Active Inactive Orientation to the Wound Care Program Nursing Diagnoses: Knowledge deficit related to the wound healing center program Goals: Patient/caregiver will verbalize understanding of the [place] Program Date Initiated: 12/11/2016 Target Resolution Date: 03/13/2017 Goal Status: Active Interventions: Provide education on orientation to the wound center Notes: Venous Leg Ulcer Nursing Diagnoses: Knowledge deficit related to disease process and management Potential for venous Insuffiency (use before diagnosis confirmed) Goals: Non-invasive venous studies are completed as ordered Date Initiated: 12/11/2016 Target Resolution Date: 03/13/2017 Goal Status: Active Patient will maintain optimal edema control Date Initiated: 12/11/2016 Target Resolution Date: 03/13/2017 Goal Status: Active Patient/caregiver will verbalize understanding of disease process and disease management Date Initiated: 12/11/2016 Target Resolution Date: 03/13/2017 Goal Status: Active Verify adequate tissue perfusion prior to therapeutic compression application Date Initiated: 12/11/2016 Target Resolution Date: 03/13/2017 Goal Status: Active Interventions: Assess peripheral edema status every visit. Compression as ordered Provide education on venous insufficiency Treatment Activities: Therapeutic compression applied : 12/11/2016 Notes: Wound/Skin Impairment Nursing Diagnoses: Impaired tissue  integrity Parlee, Dejohn ([id number]) Knowledge deficit related to ulceration/compromised skin integrity Goals: Patient/caregiver will verbalize understanding of skin care regimen Date Initiated: 12/11/2016 Target Resolution Date: 03/13/2017 Goal Status: Active Ulcer/skin breakdown will have a volume reduction of 30% by week 4 Date Initiated: 12/11/2016 Target Resolution Date: 03/13/2017 Goal Status: Active Ulcer/skin breakdown will have a volume reduction of 50% by week 8 Date Initiated: 12/11/2016 Target Resolution Date: 03/13/2017 Goal Status: Active Ulcer/skin breakdown will have a volume reduction of 80% by week 12 Date Initiated: 12/11/2016 Target Resolution Date: 03/13/2017 Goal Status: Active Ulcer/skin breakdown will heal within 14 weeks Date Initiated: 12/11/2016 Target Resolution Date: 03/13/2017 Goal Status: Active Interventions: Assess patient/caregiver ability to obtain necessary supplies Assess patient/caregiver ability to perform ulcer/skin care regimen upon admission and as needed Assess ulceration(s) every visit Provide education on ulcer and skin care Treatment Activities: Skin care regimen initiated : 12/11/2016 Topical wound management initiated : 12/11/2016 Notes: Electronic Signature(s) Signed: 07/23/2019 5:40:44 PM By: [name], [name], RN, [name], [name], [name] Entered By: [name], [name], RN, [name], [name] on 07/12/2019 08:44:20 [name] ([id number]) -------------------------------------------------------------------------------- Pain Assessment Details Patient Name: [name] Date of Service: 07/12/2019 8:15 AM Medical Record Number: [id number] Patient Account Number: [id number]  Date of Birth/Sex: 02/14/1981 (39 y.o. M) Treating RN: Montey Hora Primary Care Nour Rodrigues: [name] Other Clinician: Referring Marlei Glomski: [name] Treating Marchello Rothgeb/Extender: Melburn Hake, [name]: 135 Active Problems Location of Pain Severity and Description of  Pain Patient Has Paino Yes Site Locations Pain Location: Pain in Ulcers With Dressing Change: Yes Duration of the Pain. Constant / Intermittento Constant Rate the pain. Current Pain Level: 2 Worst Pain Level: 8 Pain Management and Medication Current Pain Management: Electronic Signature(s) Signed: 07/12/2019 4:53:18 PM By: Montey Hora Entered By: Montey Hora on 07/12/2019 08:30:08 [name] ([id number]) -------------------------------------------------------------------------------- Patient/Caregiver Education Details Patient Name: [name] Date of Service: 07/12/2019 8:15 AM Medical Record Number: [id number] Patient Account Number: [id number] Date of Birth/Gender: 11-03-1980 (39 y.o. M) Treating RN: [name] Primary Care Physician: [name] Other Clinician: Referring Physician: [name] Treating Physician/Extender: Sharalyn Ink in Treatment: 135 Education Assessment Education Provided To: Patient Education Topics Provided Venous: Handouts: Controlling Swelling with Multilayered Compression Wraps Methods: Demonstration, Explain/Verbal Responses: State content correctly Electronic Signature(s) Signed: 07/23/2019 5:40:44 PM By: [name], [name], RN, [name], [name], [name] Entered By: [name], [name], RN, [name], [name] on 07/12/2019 08:48:21 AARIC, DOLPH ([id number]) -------------------------------------------------------------------------------- Wound Assessment Details Patient Name: [name] Date of Service: 07/12/2019 8:15 AM Medical Record Number: [id number] Patient Account Number: [id number] Date of Birth/Sex: December 02, 1980 (39 y.o. M) Treating RN: Montey Hora Primary Care Carlette Palmatier: [name] Other Clinician: Referring Enes Wegener: [name] Treating Arash Karstens/Extender: Melburn Hake, [name]: 135 Wound Status Wound Number: 1 Primary Etiology: Pyoderma Wound Location: Left Lower Leg - Lateral Wound Status: Open Wounding  Event: Gradually Appeared Comorbid History: Sleep Apnea, Hypertension, Colitis Date Acquired: 11/18/2015 Weeks Of Treatment: 135 Clustered Wound: No Photos Wound Measurements Length: (cm) 5.7 Width: (cm) 6 Depth: (cm) 0.6 Area: (cm) 26.861 Volume: (cm) 16.116 % Reduction in Area: -447.2% % Reduction in Volume: -310.4% Epithelialization: None Tunneling: No Undermining: No Wound Description Classification: Full Thickness With Exposed Support Structures Wound Margin: Distinct, outline attached Exudate Amount: Medium Exudate Type: Serous Exudate Color: amber Foul Odor After Cleansing: No Slough/Fibrino Yes Wound Bed Granulation Amount: Medium (34-66%) Exposed Structure Granulation Quality: Pink Fascia Exposed: No Necrotic Amount: Medium (34-66%) Fat Layer (Subcutaneous Tissue) Exposed: Yes Necrotic Quality: Adherent Slough Tendon Exposed: No Muscle Exposed: Yes Necrosis of Muscle: No Joint Exposed: No Bone Exposed: No Electronic Signature(s) Signed: 07/12/2019 4:53:18 PM By: Montey Hora Entered By: Montey Hora on 07/12/2019 08:36:47 [name] ([id number]) -------------------------------------------------------------------------------- [place] Details Patient Name: [name] Date of Service: 07/12/2019 8:15 AM Medical Record Number: [id number] Patient Account Number: [id number] Date of Birth/Sex: [birth year] (39 y.o. M) Treating RN: Montey Hora Primary Care Mainor Hellmann: [name] Other Clinician: Referring Eugenia Eldredge: [name] Treating Daemien Fronczak/Extender: Melburn Hake, [name]: 135 Vital Signs Time Taken: 08:30 Temperature (F): 98.4 Height (in): 71 Pulse (bpm): 84 Weight (lbs): 338 Respiratory Rate (breaths/min): 18 Body Mass Index (BMI): 47.1 Blood Pressure (mmHg): 126/74 Reference Range: 80 - 120 mg / dl Electronic Signature(s) Signed: 07/12/2019 4:53:18 PM By: Montey Hora Entered By: Montey Hora on 07/12/2019  08:30:45

## 2019-07-23 NOTE — Progress Notes (Signed)
LOYD, MARHEFKA ([id number]) Visit Report for 07/22/2019 Arrival Information Details Patient Name: [name], [name] Date of Service: 07/22/2019 8:15 AM Medical Record Number: [id number] Patient Account Number: [id number] Date of Birth/Sex: 06/09/1980 (39 y.o. M) Treating RN: [name] Primary Care Dierre Crevier: [name] Other Clinician: Referring Laysha Childers: [name] Treating Madeleine Fenn/Extender: Melburn Hake, [name]: 28 Visit Information History Since Last Visit Pain Present Now: Yes Patient Arrived: Ambulatory Arrival Time: 08:22 Accompanied By: self Transfer Assistance: None Patient Identification Verified: Yes Secondary Verification Process Completed: Yes Patient Requires Transmission-Based Precautions: No Patient Has Alerts: No Electronic Signature(s) Signed: 07/22/2019 5:42:49 PM By: [name], [name], RN, [name], [name], [name] Entered By: [name], [name], RN, [name], [name] on 07/22/2019 08:22:54 [name] ([id number]) -------------------------------------------------------------------------------- Compression Therapy Details Patient Name: [name] Date of Service: 07/22/2019 8:15 AM Medical Record Number: [id number] Patient Account Number: [id number] Date of Birth/Sex: 05-May-1981 (39 y.o. M) Treating RN: [name] Primary Care Ronny Ruddell: [name] Other Clinician: Referring Chyann Ambrocio: [name] Treating Cashmere Dingley/Extender: Melburn Hake, [name]: 137 Compression Therapy Performed for Wound Assessment: Wound #1 Left,Lateral Lower Leg Performed By: Clinician [name], RN Compression Type: Three Layer Pre Treatment ABI: 1.2 Electronic Signature(s) Signed: 07/22/2019 5:42:49 PM By: [name], [name], RN, [name], [name], [name] Entered By: [name], [name], RN, [name], [name] on 07/22/2019 08:37:46 [name], [name] ([id number]) -------------------------------------------------------------------------------- Encounter Discharge Information Details Patient Name: [name] Date of  Service: 07/22/2019 8:15 AM Medical Record Number: [id number] Patient Account Number: [id number] Date of Birth/Sex: 12-Aug-1980 (39 y.o. M) Treating RN: [name] Primary Care Caterina Racine: [name] Other Clinician: Referring Joycie Aerts: [name] Treating Deronda Christian/Extender: Melburn Hake, [name]: 137 Encounter Discharge Information Items Discharge Condition: Stable Ambulatory Status: Ambulatory Discharge Destination: Home Transportation: Private Auto Accompanied By: self Schedule Follow-up Appointment: Yes Clinical Summary of Care: Electronic Signature(s) Signed: 07/22/2019 5:42:49 PM By: [name], [name], RN, [name], [name], [name] Entered By: [name], [name], RN, [name], [name] on 07/22/2019 08:38:24 [name] ([id number]) -------------------------------------------------------------------------------- Pain Assessment Details Patient Name: [name] Date of Service: 07/22/2019 8:15 AM Medical Record Number: [id number] Patient Account Number: [id number] Date of Birth/Sex: 1981/05/14 (39 y.o. M) Treating RN: [name] Primary Care Dietra Stokely: [name] Other Clinician: Referring Manasa Spease: [name] Treating Onica Davidovich/Extender: Melburn Hake, [name]: 137 Active Problems Location of Pain Severity and Description of Pain Patient Has Paino Yes Site Locations Pain Location: Pain in Ulcers Rate the pain. Current Pain Level: 2 Pain Management and Medication Current Pain Management: Electronic Signature(s) Signed: 07/22/2019 5:42:49 PM By: [name], [name], RN, [name], [name], [name] Entered By: [name], [name], RN, [name], [name] on 07/22/2019 08:23:11 [name], [name] ([id number]) -------------------------------------------------------------------------------- Wound Assessment Details Patient Name: [name] Date of Service: 07/22/2019 8:15 AM Medical Record Number: [id number] Patient Account Number: [id number] Date of Birth/Sex: [birth year] (38 y.o. M) Treating RN: [name] Primary Care Nicholas Trompeter: [name] Other Clinician: Referring Amaani Guilbault: [name] Treating Velda Wendt/Extender: Melburn Hake, [name]: 137 Wound Status Wound Number: 1 Primary Etiology: Pyoderma Wound Location: Left Lower Leg - Lateral Wound Status: Open Wounding Event: Gradually Appeared Comorbid History: Sleep Apnea, Hypertension, Colitis Date Acquired: 11/18/2015 Weeks Of Treatment: 137 Clustered Wound: No Photos Wound Measurements Length: (cm) 6 Width: (cm) 7 Depth: (cm) 0.7 Area: (cm) 32.987 Volume: (cm) 23.091 % Reduction in Area: -572% % Reduction in Volume: -488% Epithelialization: None Tunneling: No Undermining: No Wound Description Classification: Full Thickness With Exposed Support Structur Wound Margin: Epibole Exudate Amount: Large Exudate Type: Serous Exudate  Color: amber es Foul Odor After Cleansing: No Slough/Fibrino Yes Wound Bed Granulation Amount: Medium (34-66%) Exposed Structure Granulation Quality: Pink Fascia Exposed: No Necrotic Amount: Medium (34-66%) Fat Layer (Subcutaneous Tissue) Exposed: Yes Necrotic Quality: Adherent Slough Tendon Exposed: No Muscle Exposed: Yes Necrosis of Muscle: No Joint Exposed: No Bone Exposed: No Treatment Notes Wound #1 (Left, Lateral Lower Leg) Notes TCA, sorbact, xsorb, 3 layer left (unna to anchor) Electronic Signature(s) ZIERE, DOCKEN ([id number]) Signed: 07/22/2019 5:42:49 PM By: [name], [name], RN, [name], [name], [name] Entered By: [name], [name], RN, [name], [name] on 07/22/2019 08:30:42

## 2019-07-26 ENCOUNTER — Encounter: Payer: 59 | Admitting: Physician Assistant

## 2019-07-26 ENCOUNTER — Other Ambulatory Visit
Admission: RE | Admit: 2019-07-26 | Discharge: 2019-07-26 | Disposition: A | Discharge status: Home / Self Care | Payer: 59 | Source: Ambulatory Visit | Attending: Physician Assistant | Admitting: Physician Assistant

## 2019-07-26 ENCOUNTER — Other Ambulatory Visit: Payer: Self-pay

## 2019-07-26 DIAGNOSIS — L97222 Non-pressure chronic ulcer of left calf with fat layer exposed: Secondary | ICD-10-CM | POA: Diagnosis not present

## 2019-07-26 DIAGNOSIS — L089 Local infection of the skin and subcutaneous tissue, unspecified: Secondary | ICD-10-CM | POA: Diagnosis present

## 2019-07-26 NOTE — Progress Notes (Addendum)
[name], [name] ([id number]) Visit Report for 07/26/2019 Chief Complaint Document Details Patient Name: [name] Date of Service: 07/26/2019 8:15 AM Medical Record Number: [id number] Patient Account Number: [id number] Date of Birth/Sex: June 09, 1980 (39 y.o. 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 M) Treating RN: Primary Care Provider: [name] Other Clinician: Referring Provider: [name] Treating Provider/Extender: Melburn Hake, Loys Shugars Weeks in Treatment: 137 History of Present Illness HPI Description: 12/04/16; 39 year old man who comes into the clinic today for review of a wound on the posterior left calf. He tells me that is been there for about a year. He is not a diabetic he does smoke half a pack per day. He was seen in the ER on 11/20/16 felt to have cellulitis around the wound and was given clindamycin. An x-ray did not show osteomyelitis. The patient initially tells me that he has a milk allergy that sets off a pruritic itching rash on his lower legs which she scratches incessantly and he thinks that's what may have set up the wound. He has been using various topical antibiotics and ointments without any effect. He works in a [employer] and is on his feet all day. He does not have a prior history of wounds however he  does have the rash on both lower legs the right arm and the ventral aspect of his left arm. These are excoriations and clearly have had scratching however there are of macular looking areas on both legs including a substantial larger area on the right leg. This does not have an underlying open area. There is no blistering. The patient tells me that 2 years ago in [place] in response to the rash on his legs he saw a dermatologist who told him he had a condition which may be pyoderma gangrenosum although I may be putting words into his mouth. He seemed to recognize this. 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 He is not in any treatment for this. He's had no recent travel 12/11/16; the patient arrives today with his wound and roughly the same condition we've been using silver alginate this is a deep punched out wound with some surrounding erythema but no tenderness. Biopsy I did did not show confirmed pyoderma gangrenosum suggested nonspecific inflammation and vasculitis but does not provide an actual description of what was seen by the pathologist. I'm really not able to understand this We have also received information from the patient's dermatologist in [place] notes from April 2016. This was a doctor Agarwal-antal. 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 He was prescribed topical steroid high potency under occlusion which helped but at this point the patient did not have a deep punched out wound. 12/18/16; the patient's wound is larger in terms of surface area however this surface looks better and there is less depth. The surrounding erythema also is better. The patient states that the wrap we put on came off 2 days ago when he has been using his compression stockings. He we  are in the process of getting a dermatology consult. 12/26/16 on evaluation today patient's left lower extremity wound shows evidence of infection with surrounding erythema  noted. He has been tolerating the dressing changes but states that he has noted more discomfort. There is a larger area of erythema surrounding the wound. No fevers, chills, nausea, or vomiting noted at this time. With that being said the wound still does have slough covering the surface. He is not allergic to any medication that he is aware of at this point. In regard to his right lower extremity he had several regions that are erythematous and pruritic he wonders if there's anything we can do to help that. 01/02/17 I reviewed patient's wound culture which was obtained his visit last week. He was placed on doxycycline at that point. Unfortunately that does not appear to be an antibiotic that would likely help with the situation however the pseudomonas noted on culture is sensitive to Cipro. Also unfortunately patient's wound seems to have a large compared to last week's evaluation. Not severely so but there are definitely increased measurements in general. He is continuing to have discomfort as well he writes this to be a seven out of 10. In fact he would prefer me not to perform any debridement today due to the fact that he is having discomfort and considering he has an active infection on the little reluctant to do so anyway. No fevers, chills, nausea, or vomiting noted at this time. 01/08/17; patient seems dermatology on September 5. I suspect dermatology will want the slides from the biopsy I did sent to their pathologist. I'm not sure if there is a way we can expedite that. In any case the culture I did before I left on vacation 3 weeks ago showed Pseudomonas he was given 10 days of Cipro and per her description of her intake nurses is actually somewhat better this week although the wound is quite a bit bigger than I remember the last time I saw this. He still has 3 more days of Cipro 01/21/17; dermatology appointment tomorrow. He has completed the ciprofloxacin for Pseudomonas. Surface of the wound  looks better however he is had some deterioration in the lesions on his right leg. Meantime the left lateral leg wound we will continue with sample 01/29/17; patient had his dermatology appointment but I can't yet see that note. He is completed his antibiotics. The wound is more superficial but considerably larger in circumferential area than when he came in. This is in his left lateral calf. He also has swollen erythematous areas with superficial wounds on the right leg and small papular areas on both arms. There apparently areas in her his upper thighs and buttocks I did not look at those. Dermatology biopsied the right leg. Hopefully will have their input next week. 02/05/17; patient went back to see his dermatologist who told him that he had a "scratching problem" as well as staph. He is now on a 30 day course of doxycycline and I believe she gave him triamcinolone cream to the right leg areas to help with the itching [not exactly sure but probably triamcinolone]. She apparently looked at the left lateral leg wound although this was not rebiopsied and I think felt to be ultimately part of the same pathogenesis. He is using sample border foam and changing nevus himself. He now has a new open area on the right posterior leg which was his biopsy site I don't have any of the dermatology notes 02/12/17; we  put the patient in compression last week with SANTYL to the wound on the left leg and the biopsy. Edema is much better and the depth of the wound is now at level of skin. Area is still the same oBiopsy site on the right lateral leg we've also been using santyl with a border foam dressing and he is changing this himself. 02/19/17; Using silver alginate started last week to both the substantial left leg wound and the biopsy site on the right wound. He is tolerating compression well. Has a an appointment with his primary M.D. tomorrow wondering about diuretics although I'm wondering if the edema problem  is [name], [name] ([id number]) actually lymphedema 02/26/17; the patient has been to see his primary doctor Dr. Jerrel Ivory at [facility]. She started him on Lasix 20 mg and this seems to have helped with the edema. However we are not making substantial change with the left lateral calf wound and inflammation. The biopsy site on the right leg also looks stable but not really all that different. 03/12/17; the patient has been to see vein and vascular Dr. Lucky Cowboy. He has had venous reflux studies I have not reviewed these. I did get a call from his dermatology office. 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 They therefore recommended clobetasol and tetracycline i.e. 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 03/26/17; apparently the patient just had reflux studies not an appointment with Dr. dew. She arrives in clinic today having applied clobetasol for 2-3 weeks. He notes over the last 2-3 days excessive drainage having to change the dressing 3-4 times a day and also expanding erythema. He states the expanding erythema seems to come and go and was last this red was earlier in the month.he is on doxycycline 150 mg twice a day as an anti- inflammatory systemic therapy for possible pyoderma gangrenosum along with the topical clobetasol 04/02/17; the patient was seen last week by Dr. Lillia Carmel at [facility] dermatology locally who kindly saw him at my request. 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 My concern was the degree of erythema medially extending from his left leg wound which was either inflammation from pyoderma or cellulitis. I put him on  Augmentin however culture of the wound showed Pseudomonas which is quinolone sensitive. I really don't believe he has cellulitis however in view of everything I will continue and give him a course of Cipro. He is also on doxycycline as an immune modulator for the pyoderma. In addition to his original wound on the left lateral leg with surrounding erythema he has a wound on the right posterior calf which was an original biopsy site done by dermatology. 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 Saw Dr. Lillia Carmel yesterday. He has been using topical antibiotics to both wound areas his original wound on the left and the biopsies/pathergy area on the right. There is definitely some improvement in the inflammation around the wound on the right although the patient states he has increasing sensitivity of the wounds. He is on prednisone 60 and doxycycline 1 as prescribed by Dr. Lillia Carmel. He is covering the topical antibiotic with gauze and  putting this in his own compression stocks and changing this daily. He states that Dr. Lottie Rater did a culture of the left leg wound yesterday 05/07/17; pyoderma gangrenosum. The patient saw Dr. Lillia Carmel yesterday and has a follow-up with her in one month. He is still using topical antibiotics to both wounds although he can't recall exactly what type. He is still on prednisone 60 mg. Dr. Lillia Carmel stated that the doxycycline could stop if we were in agreement. 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 The right medial leg was induced by biopsy/pathergy. The area on the right is essentially healed. 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 The right medial leg has closed and remains closed. He is still on prednisone  60. oHe tells me he missed his last dermatology appointment with Dr. Lillia Carmel but will make another appointment. He reports that her blood sugar at a recent screen in [place] was high 200's. He was 180 today. 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 I think he is going to require still much longer prednisone perhaps another 3 months before attempting to taper. In the meantime his wound is a lot better. Smaller. He is cleaning this off daily and applying topical antibiotics. When he was last in the clinic I thought about changing to [facility] and actually put in a couple of calls to dermatology although probably not during their business hours. 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 He continues on oral prednisone. He has been using triple antibiotic ointment. There is surface debris and we will transition to [facility] and have him return in 2 weeks. He has lost 30 pounds since his last appointment with lifestyle modification. He may benefit from topical steroid cream for treatment this can be considered at a later date. 08/22/17 on evaluation today patient appears to actually be doing rather well in regard to his left lateral lower extremity ulcer. He has actually been managed by Dr. Dellia Nims most recently. Patient is currently on oral steroids at this time. This seems to have been of benefit for him. Nonetheless his last visit was actually with Leah on 08/06/17. Currently he is not utilizing any topical steroid creams although this could be of benefit as well. No fevers, chills, nausea, or vomiting noted at this time. 09/05/17 on evaluation today patient appears to be doing better in regard to his left lateral lower extremity ulcer. He has been tolerating the dressing changes without complication. He is using Santyl with good effect. Overall I'm very pleased with how  things are standing at this point. Patient likewise is happy that this is doing better. 09/19/17 on evaluation today patient actually appears to be doing rather well in regard to his left lateral lower extremity ulcer. 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 He's not having any significant pain. 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 He has been tolerating the Santyl without complication and in general I feel like he's making good progress. 10/17/17 on evaluation today patient appears  to be doing very well in regard to his left lateral lower surety ulcer. He has been tolerating the dressing changes without complication. There does not appear to be any evidence of infection he's alternating the Santyl and the triple antibiotic ointment every other day this seems to be doing well for him. 11/03/17 on evaluation today patient appears to be doing very well in regard to his left lateral lower extremity ulcer. He is been tolerating the dressing changes without complication which is good news. Fortunately there does not appear to be any evidence of infection which is also great news. Overall is doing excellent they are starting to taper down on the prednisone is down to 40 mg at this point it also started topical clobetasol for him. 11/17/17 on evaluation today patient appears to be doing well in regard to his left lateral lower surety ulcer. He's been tolerating the dressing changes without complication. He does note that he is having no pain, no excessive drainage or discharge, and overall he feels like things are going about how he would expect and hope they would. Overall he seems to have no evidence of infection at this time in my opinion which is good news. 12/04/17-He is seen in follow-up evaluation for right lateral  lower extremity ulcer. He has been applying topical steroid cream. Today's measurement show slight increase in size. Over the next 2 weeks we will transition to every other day Santyl and steroid cream. He has been encouraged to monitor for changes and notify clinic with any concerns 12/15/17 on evaluation today patient's left lateral motion the ulcer and fortunately is doing worse again at this point. This just since last week to this [name] ([id number]) week has close to doubled in size according to the patient. I did not seeing last week's I do not have a visual to compare this to in our system was also down so we do not have all the charts and at this point. Nonetheless it does have me somewhat concerned in regard to the fact that again he was worried enough about it he has contact the dermatology that placed them back on the full strength, 50 mg a day of the prednisone that he was taken previous. He continues to alternate using clobetasol along with Santyl at this point. He is obviously somewhat frustrated. 12/22/17 on evaluation today patient appears to be doing a little worse compared to last evaluation. Unfortunately the wound is a little deeper and slightly larger than the last week's evaluation. With that being said he has made some progress in regard to the irritation surrounding at this time unfortunately despite that progress that's been made he still has a significant issue going on here. 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 For that reason I discussed with him today the possibility of perform a wound culture to ensure there's nothing overtly infected. 01/06/18 on evaluation today patient's wound is larger and deeper than previously evaluated. With that being said it did appear that his wound was infected after my last evaluation  with him. Subsequently I did end up prescribing a prescription for Bactrim DS which she has been taking and having no complication with. Fortunately there does not appear to be any evidence of infection at this point in time as far as anything spreading, no want to touch, and overall I feel like things  are showing signs of improvement. 01/13/18 on evaluation today patient appears to be even a little larger and deeper than last time. There still muscle exposed in the base of the wound. Nonetheless he does appear to be less erythematous I do believe inflammation is calming down also believe the infection looks like it's probably resolved at this time based on what I'm seeing. No fevers, chills, nausea, or vomiting noted at this time. 01/30/18 on evaluation today patient actually appears to visually look better for the most part. Unfortunately those visually this looks better he does seem to potentially have what may be an abscess in the muscle that has been noted in the central portion of the wound. This is the first time that I have noted what appears to be fluctuance in the central portion of the muscle. With that being said I'm somewhat more concerned about the fact that this might indicate an abscess formation at this location. I do believe that an ultrasound would be appropriate. This is likely something we need to try to do as soon as possible. He has been switch to mupirocin ointment and he is no longer using the steroid ointment as prescribed by dermatology he sees them again next week he's been decreased from 60 to 40 mg of prednisone. 03/09/18 on evaluation today patient actually appears to be doing a little better compared to last time I saw him. There's not as much erythema surrounding the wound itself. He I did review his most recent infectious disease note which was dated 02/24/18. He saw Dr. Michel Bickers in [place]. 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 Patient is now off of antibiotics and they are continually observing this. There seems to be no change in the past two weeks in my pinion based on what the patient says and what I see today compared to what Dr. Megan Salon likely saw two weeks ago. No fevers, chills, nausea, or vomiting noted at this time. 03/23/18 on evaluation today patient's wound actually appears to be showing signs of improvement which is good news. He is currently still on the Dapsone. He is also working on tapering the prednisone to get off of this and Dr. Lottie Rater is working with him in this regard. Nonetheless overall I feel like the wound is doing well it does appear based on the infectious disease note that I reviewed from Dr. 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 I did also review the results of his ultrasound of left lower extremity which revealed there was a dentist tissue in the base of the wound without an abscess noted. 04/06/18 on evaluation today the patient's left lateral lower extremity ulcer actually appears to be doing fairly well which is excellent news. There does not appear to be any evidence of infection at this time which is also great news. Overall he still does have a significantly large ulceration although little by little he seems to be making progress. He is down to 10 mg a day of the prednisone. 04/20/18 on evaluation today patient actually appears to be doing excellent at this time in regard to his left lower extremity ulcer. He's making signs of good progress unfortunately this is taking much longer than we would really like to see but nonetheless he is making  progress. Fortunately there does not appear to be any evidence of infection at this time. No fevers, chills, nausea, or vomiting noted at this time. The patient has not been  using the Santyl due to the cost he hadn't got in this field yet. He's mainly been using the antibiotic ointment topically. Subsequently he also tells me that he really has not been scrubbing in the shower I think this would be helpful again as I told him it doesn't have to be anything too aggressive to even make it believe just enough to keep it free of some of the loose slough and biofilm on the wound surface. 05/11/18 on evaluation today patient's wound appears to be making slow but sure progress in regard to the left lateral lower extremity ulcer. He is been tolerating the dressing changes without complication. Fortunately there does not appear to be any evidence of infection at this time. He is still just using triple antibiotic ointment along with clobetasol occasionally over the area. He never got the Santyl and really does not seem to intend to in my pinion. 06/01/18 on evaluation today patient appears to be doing a little better in regard to his left lateral lower extremity ulcer. He states that overall he does not feel like he is doing as well with the Dapsone as he did with the prednisone. Nonetheless he sees his dermatologist later today and is gonna talk to them about the possibility of going back on the prednisone. Overall again I believe that the wound would be better if you would utilize Santyl but he really does not seem to be interested in going back to the [place] at this point. He has been using triple antibiotic ointment. 06/15/18 on evaluation today patient's wound actually appears to be doing about the same at this point. Fortunately there is no signs of infection at this time. He has made slight improvements although he continues to not really want to clean the wound bed at this point. He states that he just doesn't mess with it he doesn't want to cause any problems with everything else he has going on. He has been on medication, antibiotics as prescribed by his dermatologist, for  a staff infection of his lower extremities which is really drying out now and looking much better he tells me. Fortunately there is no sign of overall infection. 06/29/18 on evaluation today patient appears to be doing well in regard to his left lateral lower surety ulcer all things considering. Fortunately his staff infection seems to be greatly improved compared to previous. He has no signs of infection and this is drying up quite nicely. He is still the doxycycline for this is no longer on cental, Dapsone, or any of the other medications. His dermatologist has recommended possibility of an infusion but right now he does not want to proceed with that. 07/13/18 on evaluation today patient appears to be doing about the same in regard to his left lateral lower surety ulcer. Fortunately there's no signs of infection at this time which is great news. Unfortunately he still builds up a significant amount of Slough/biofilm of the surface of the wound he still is not Barra, [name] ([id number]) really cleaning this as he should be appropriately. Again I'm able to easily with saline and gauze remove the majority of this on the surface which if you would do this at home would likely be a dramatic improvement for him as far as getting the area to improve. Nonetheless overall I still feel  like he is making progress is just very slow. I think Santyl will be of benefit for him as well. Still he has not gotten this as of this point. 07/27/18 on evaluation today patient actually appears to be doing little worse in regards of the erythema around the periwound region of the wound he also tells me that he's been having more drainage currently compared to what he was experiencing last time I saw him. He states not quite as bad as what he had because this was infected previously but nonetheless is still appears to be doing poorly. Fortunately there is no evidence of systemic infection at this point. The patient tells me that  he is not going to be able to afford the Santyl. He is still waiting to hear about the infusion therapy with his dermatologist. Apparently she wants an updated colonoscopy first. 08/10/18 on evaluation today patient appears to be doing better in regard to his left lateral lower extremity ulcer. Fortunately he is showing signs of improvement in this regard he's actually been approved for Remicade infusion's as well although this has not been scheduled as of yet. Fortunately there's no signs of active infection at this time in regard to the wound although he is having some issues with infection of the right lower extremity is been seen as dermatologist for this. Fortunately they are definitely still working with him trying to keep things under control. 09/07/18 on evaluation today patient is actually doing rather well in regard to his left lateral lower extremity ulcer. He notes these actually having some hair grow back on his extremity which is something he has not seen in years. He also tells me that the pain is really not giving them any trouble at this time which is also good news overall she is very pleased with the progress he's using a combination of the mupirocin along with the probate is all mixed. 09/21/18 on evaluation today patient actually appears to be doing fairly well all things considered in regard to his looks from the ulcer. He's been tolerating the dressing changes without complication. Fortunately there's no signs of active infection at this time which is good news he is still on all antibiotics or prevention of the staff infection. He has been on prednisone for time although he states it is gonna contact his dermatologist and see if she put them on a short course due to some irritation that he has going on currently. Fortunately there's no evidence of any overall worsening this is going very slow I think cental would be something that would be helpful for him although he states that $50  for tube is quite expensive. He therefore is not willing to get that at this point. 10/06/18 on evaluation today patient actually appears to be doing decently well in regard to his left lateral leg ulcer. He's been tolerating the dressing changes without complication. Fortunately there's no signs of active infection at this time. Overall I'm actually rather pleased with the progress he's making although it's slow he doesn't show any signs of infection and he does seem to be making some improvement. I do believe that he may need a switch up and dressings to try to help this to heal more appropriately and quickly. 10/19/18 on evaluation today patient actually appears to be doing better in regard to his left lateral lower extremity ulcer. This is shown signs of having much less Slough buildup at this point due to the fact he has been using the  Santyl. Obviously this is very good news. The overall size of the wound is not dramatically smaller but again the appearance is. 11/02/18 on evaluation today patient actually appears to be doing quite well in regard to his lower [place] ulcer. A lot of the skin around the ulcer is actually somewhat irritating at this point this seems to be more due to the dressing causing irritation from the adhesive that anything else. Fortunately there is no signs of active infection at this time. 11/24/18 on evaluation today patient appears to be doing a little worse in regard to his overall appearance of his lower extremity ulcer. There's more erythema and warmth around the wound unfortunately. He is currently on doxycycline which he has been on for some time. With that being said I'm not sure that seems to be helping with what appears to possibly be an acute cellulitis with regard to his left lower extremity ulcer. No fevers, chills, nausea, or vomiting noted at this time. 12/08/18 on evaluation today patient's wounds actually appears to be doing significantly better compared to his  last evaluation. He has been using Santyl along with alternating tripling about appointment as well as the steroid cream seems to be doing quite well and the wound is showing signs of improvement which is excellent news. Fortunately there's no evidence of infection and in fact his culture came back negative with only normal skin flora noted. 12/21/2018 upon evaluation today patient actually appears to be doing excellent with regard to his ulcer. This is actually the best that I have seen it since have been helping to take care of him. It is both smaller as well as less slough noted on the surface of the wound and seems to be showing signs of good improvement with new skin growing from the edges. He has been using just the triamcinolone he does wonder if he can get a refill of that ointment today. 01/04/2019 upon evaluation today patient actually appears to be doing well with regard to his left lateral lower extremity ulcer. With that being said it does not appear to be that he is doing quite as well as last time as far as progression is concerned. There does not appear to be any signs of infection or significant irritation which is good news. With that being said I do believe that he may benefit from switching to a collagen based dressing based on how clean The wound appears. 01/18/2019 on evaluation today patient actually appears to be doing well with regard to his wound on the left lower extremity. He is not made a lot of progress compared to where we were previous but nonetheless does seem to be doing okay at this time which is good news. There is no signs of active infection which is also good news. My only concern currently is I do wish we can get him into utilizing the collagen dressing his insurance would not pay for the supplies that we ordered although it appears that he may be able to order this through his supply company that he typically utilizes. This is [employer]. Nonetheless he did try to  order it during the office visit today and it appears this did go through. We will see if he can get that it is a different brand but nonetheless he has collagen and I do think will be beneficial. 02/01/2019 on evaluation today patient actually appears to be doing a little worse today in regard to the overall size of his wounds. Fortunately there is  no signs of active infection at this time. That is visually. Nonetheless when this is happened before it was due to infection. For that reason were somewhat concerned about that this time as well. 02/08/2019 on evaluation today patient unfortunately appears to be doing slightly worse with regard to his wound upon evaluation today. Is measuring a Galeas, Nori ([id number]) little deeper and a little larger unfortunately. I am not really sure exactly what is causing this to enlarge he actually did see his dermatologist she is going to see about initiating Humira for him. Subsequently she also did do steroid injections into the wound itself in the periphery. Nonetheless still nonetheless he seems to be getting a little bit larger he is gone back to just using the steroid cream topically which I think is appropriate. I would say hold off on the collagen for the time being is definitely a good thing to do. Based on the culture results which we finally did get the final result back regarding it shows staph as the bacteria noted again that can be a normal skin bacteria based on the fact however he is having increased drainage and worsening of the wound measurement wise I would go ahead and place him on an antibiotic today I do believe for this. 02/15/2019 on evaluation today patient actually appears to be doing somewhat better in regard to his ulcer. There is no signs of worsening at this time I did review his culture results which showed evidence of Staphylococcus aureus but not MRSA. Again this could just be more related to the normal skin bacteria although he  states the drainage has slowed down quite a bit he may have had a mild infection not just colonization. And was much smaller and then since around10/04/2019 on evaluation today patient appears to be doing unfortunately worse as far as the size of the wound. I really feel like that this is steadily getting larger again it had been doing excellent right at the beginning of September we have seen a steady increase in the area of the wound it is almost 2-1/2 times the size it was on September 1. Obviously this is a bad trend this is not wanting to see. For that reason we went back to using just the topical triamcinolone cream which does seem to help with inflammation. I checked him for bacteria by way of culture and nothing showed positive there. I am considering giving him a short course of a tapering steroid Dosepak today to see if that is can be beneficial for him. The patient is in agreement with giving that a try. 03/08/2019 on evaluation today patient appears to be doing very well in comparison to last evaluation with regard to his lower extremity ulcer. This is showing signs of less inflammation and actually measuring slightly smaller compared to last time every other week over the past month and a half he has been measuring larger larger larger. Nonetheless I do believe that the issue has been inflammation the prednisone does seem to have been beneficial for him which is good news. No fevers, chills, nausea, vomiting, or diarrhea. 03/22/2019 on evaluation today patient appears to be doing about the same with regard to his leg ulcer. He has been tolerating the dressing changes without complication. With that being said the wound seems to be mostly arrested at its current size but really is not making any progress except for when we prescribed the prednisone. He did show some signs of dropping as far as the  overall size of the wound during that interval week. Nonetheless this is something he is not on  long-term at this point and unfortunately I think he is getting need either this or else the Humira which his dermatologist has discussed try to get approval for. With that being said he will be seeing his dermatologist on the 11th of this month that is November. 04/19/2019 on evaluation today patient appears to be doing really about the same the wound is measuring slightly larger compared to last time I saw him. He has not been into the office since November 2 due to the fact that he unfortunately had Covid as that his entire family. He tells me that it was rough but they did pull-through and he seems to be doing much better. Fortunately there is no signs of active infection at this time. No fevers, chills, nausea, vomiting, or diarrhea. 05/10/2019 on evaluation today patient unfortunately appears to be doing significantly worse as compared to last time I saw him. He does tell me that he has had his first dose of Humira and actually is scheduled to get the next one in the upcoming week. With that being said he tells me also that in the past several days he has been having a lot of issues with green drainage she showed me a picture this is more blue-green in color. He is also been having issues with increased sloughy buildup and the wound does appear to be larger today. Obviously this is not the direction that we want everything to take based on the starting of his Humira. Nonetheless I think this is definitely a result of likely infection and to be honest I think this is probably Pseudomonas causing the infection based on what I am seeing. 05/24/2019 on evaluation today patient unfortunately appears to be doing significantly worse compared to his prior evaluation with me 2 weeks ago. I did review his culture results which showed that he does have Staph aureus as well as Pseudomonas noted on the culture. Nonetheless the Levaquin that I prescribed for him does not appear to have been appropriate and in  fact he tells me he is no longer experiencing the green drainage and discharge that he had at the last visit. Fortunately there is no signs of active infection at this time which is good news although the wound has significantly worsened it in fact is much deeper than it was previous. We have been utilizing up to this point triamcinolone ointment as the prescription topical of choice but at this time I really feel like that the wound is getting need to be packed in order to appropriately manage this due to the deeper nature of the wound. Therefore something along the lines of an alginate dressing may be more appropriate. 05/31/2019 upon inspection today patient's wound actually showed signs of doing poorly at this point. Unfortunately he just does not seem to be making any good progress despite what we have tried. He actually did go ahead and pick up the Cipro and start taking that as he was noticing more green drainage he had previously completed the Levaquin that I prescribed for him as well. Nonetheless he missed his appointment for the seventh last week on Wednesday with the wound care center and [facility] where his dermatologist referred him. Obviously I do think a second opinion would be helpful at this point especially in light of the fact that the patient seems to be doing so poorly despite the fact that  we have tried everything that I really know how at this point. The only thing that ever seems to have helped him in the past is when he was on high doses of continual steroids that did seem to make a difference for him. 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 06/07/2019 upon evaluation today patient unfortunately appears to be doing worse yet again with regard to his wound. In fact I am starting to question whether or not he may  have a fluid pocket in the muscle at this point based on the bulging and the soft appearance to the central portion of the muscle area. There is not anything draining from the muscle itself at this time which is good news but nonetheless the wound is expanding. I am not really seeing any results of the Humira as far as overall wound progression based on what I am seeing at this point. The patient has been referred for second opinion with regard to his wound to the [facility] wound care center by his dermatologist which I definitely am not in opposition to. Unfortunately we tried multiple dressings in the past including collagen, alginate, and at one point even Hydrofera Blue. With that being said he is never really used it for any significant amount of time due to the fact that he often complains of pain associated with these dressings and then will go back to either using the Santyl which she has done intermittently or more frequently the triamcinolone. He is also using his own compression stockings. We have wrapped him in the past but again that was something else that he really was not a big fan of. 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 He seemed to do much better when he was on the steroids although honestly I understand that the steroids are not necessarily the best medication to be on long-term obviously Caulder, Welford ([id number]) 06/14/2019 on evaluation today patient appears to be doing actually a little bit better with regard to the overall appearance with his leg. Unfortunately he does continue to have issues with what appears to be some fluid underneath the muscle although he  did see the wound specialty center at [facility] last week their main goals were to see about infusion therapy in place of the Humira as they feel like that is not quite strong enough. They also recommended that we continue with the treatment otherwise as we are they felt like that was appropriate and they are okay with him continuing to follow-up here with [place] in that regard. With that being said they are also sending him to the vein specialist there to see about vein stripping and if that would be of benefit for him. Subsequently they also did not really address whether or not an ultrasound of the muscle area to see if there is anything that needs to be addressed here would be appropriate or not. For that reason I discussed this with him last week I  think we may proceed down that road at this point. 06/21/2019 upon evaluation today patient's wound actually appears to be doing slightly better compared to previous evaluations. I do believe that he has made a difference with regard to the progression here with the use of oral steroids. Again in the past has been the only thing that is really calm things down. He does tell me that from [facility] is gotten a good news from there that there are no further vein stripping that is necessary at this point. I do not have that available for review today although the patient did relay this to me. He also did obtain and have the ultrasound of the wound completed which I did sign off on today. It does appear that there is no fluid collection under the muscle this is likely then just edematous tissue in general. That is also good news. Overall I still believe the inflammation is the main issue here. He did inquire about the possibility of a wound VAC again with the muscle protruding like it is I am not really sure whether the wound VAC is necessarily ideal or not. That is something we will have to consider although I do believe he may need compression wrapping to try to help with  edema control which could potentially be of benefit. 06/28/2019 on evaluation today patient appears to be doing slightly better measurement wise although this is not terribly smaller he least seems to be trending towards that direction. With that being said he still seems to have purulent drainage noted in the wound bed at this time. He has been on Levaquin followed by Cipro over the past month. Unfortunately he still seems to have some issues with active infection at this time. I did perform a culture last week in order to evaluate and see if indeed there was still anything going on. Subsequently the culture did come back showing Pseudomonas which is consistent with the drainage has been having which is blue-green in color. He also has had an odor that again was somewhat consistent with Pseudomonas as well. Long story short it appears that the culture showed an intermediate finding with regard to how well the Cipro will work for the Pseudomonas infection. Subsequently being that he does not seem to be clearing up and at best what we are doing is just keeping this at [facility] I think he may need to see infectious disease to discuss IV antibiotic options. 07/05/2019 upon evaluation today patient appears to be doing okay in regard to his leg ulcer. He has been tolerating the dressing changes at this point without complication. Fortunately there is no signs of active infection at this time which is good news. No fevers, chills, nausea, vomiting, or diarrhea. With that being said he does have an appointment with infectious disease tomorrow and his primary care on Wednesday. Again the reason for the infectious disease referral was due to the fact that he did not seem to be fully resolving with the use of oral antibiotics and therefore we were thinking that IV antibiotic therapy may be necessary secondary to the fact that there was an intermediate finding for how effective the Cipro may be. Nonetheless again he has  been having a lot of purulent and even green drainage. Fortunately right now that seems to have calmed down over the past week with the reinitiation of the oral antibiotic. Nonetheless we will see what Dr. Megan Salon has to say. 07/12/2019 upon evaluation today patient appears to be doing  about the same at this point in regard to his left lower extremity ulcer. Fortunately there is no signs of active infection at this time which is good news I do believe the Levaquin has been beneficial I did review Dr. Hale Bogus note and to be honest I agree that the patient's leg does appear to be doing better currently. What we found in the past as he does not seem to really completely resolve he will stop the antibiotic and then subsequently things will revert back to having issues with blue-green drainage, increased pain, and overall worsening in general. Obviously that is the reason I sent him back to infectious disease. 07/19/2019 upon evaluation today patient appears to be doing roughly the same in size there is really no dramatic improvement. He has started back on the Levaquin at this point and though he seems to be doing okay he did still have a lot of blue/green drainage noted on evaluation today unfortunately. I think that this is still indicative more likely of a Pseudomonas infection as previously noted and again he does see Dr. Megan Salon in just a couple of days. I do not know that were really able to effectively clear this with just oral antibiotics alone based on what I am seeing currently. Nonetheless we are still continue to try to manage as best we can with regard to the patient and his wound. I do think the wrap was helpful in decreasing the edema which is excellent news. No fevers, chills, nausea, vomiting, or diarrhea. 07/26/2019 upon evaluation today patient appears to be doing slightly better with regard to the overall appearance of the muscle there is no dark discoloration centrally. Fortunately  there is no signs of active infection at this time. No fevers, chills, nausea, vomiting, or diarrhea. Patient's wound bed currently the patient did have an appointment with Dr. Megan Salon at infectious disease last week. With that being said Dr. Megan Salon the patient states was still somewhat hesitant about put him on any IV antibiotics he wanted [place] to repeat cultures today and then see where things go going forward. He does look like Dr. Megan Salon because of some improvement the patient did have with the Levaquin wanted [place] to see about repeating cultures. If it indeed grows the Pseudomonas again then he recommended a possibility of considering a PICC line placement and IV antibiotic therapy. He plans to see the patient back in 1 to 2 weeks. Electronic Signature(s) Signed: 07/26/2019 9:04:59 AM By: Worthy Keeler PA-C Previous Signature: 07/26/2019 9:01:28 AM Version By: Worthy Keeler PA-C Entered By: Worthy Keeler on 07/26/2019 09:04:58 [name], [name] ([id number]) -------------------------------------------------------------------------------- Physical Exam Details Patient Name: [name] Date of Service: 07/26/2019 8:15 AM Medical Record Number: [id number] Patient Account Number: [id number] Date of Birth/Sex: January 27, 1981 (39 y.o. M) Treating RN: Primary Care Provider: [name] Other Clinician: Referring Provider: [name] Treating Provider/Extender: Melburn Hake, Aliveah Gallant Weeks in Treatment: 17 Constitutional Well-nourished and well-hydrated in no acute distress. Respiratory normal breathing without difficulty. Psychiatric this patient is able to make decisions and demonstrates good insight into disease process. Alert and Oriented x 3. pleasant and cooperative. Notes Patient's wound currently does appear to be showing some signs of slight improvement. There is no evidence of active systemic infection which is good news. He does have still purulent drainage noted which is somewhat  bright yellow/slightly green tinted. We are using Sorbact at this point. Electronic Signature(s) Signed: 07/26/2019 9:05:46 AM By: Worthy Keeler PA-C Entered By: Worthy Keeler  on 07/26/2019 09:05:45 [name], [name] ([id number]) -------------------------------------------------------------------------------- Physician Orders Details Patient Name: [name], SOBERANO Date of Service: 07/26/2019 8:15 AM Medical Record Number: [id number] Patient Account Number: [id number] Date of Birth/Sex: July 08, 1980 (39 y.o. 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 Anesthetic (add to Medication List) Wound #1 Left,Lateral Lower Leg o Topical Lidocaine 4% cream applied to wound bed prior to debridement (In Clinic Only). Skin Barriers/Peri-Wound Care Wound #1 Left,Lateral Lower Leg o Triamcinolone Acetonide Ointment (TCA) Primary Wound Dressing o Other: - Sorbact Secondary Dressing Wound #1 Left,Lateral Lower Leg o XtraSorb Dressing Change Frequency Wound #1 Left,Lateral Lower Leg o Dressing is to be changed Monday and Thursday. Follow-up Appointments Wound #1 Left,Lateral Lower Leg o Return Appointment in 1 week. o Nurse Visit as needed Edema Control Wound #1 Left,Lateral Lower Leg o 3 Layer Compression System - Left Lower Extremity o Elevate legs to the level of the heart and pump ankles as often as possible Medications-please add to medication list. Wound #1 Left,Lateral Lower Leg o P.O.  Antibiotics - Continue antibiotics Laboratory o Bacteria identified in Wound by Culture (MICRO) - left leg oooo LOINC Code: 4827-0 [name], [name] ([id number]) oooo Convenience Name: Wound culture routine Electronic Signature(s) Signed: 07/29/2019 9:13:28 AM By: Army Melia Signed: 07/30/2019 4:19:26 PM By: Worthy Keeler PA-C Previous Signature: 07/26/2019 4:58:25 PM Version By: Army Melia Previous Signature: 07/27/2019 12:55:39 AM Version By: Worthy Keeler PA-C Entered By: Army Melia on 07/29/2019 08:33:38 YAAKOV, SAINDON ([id number]) -------------------------------------------------------------------------------- Problem List Details Patient Name: [name] Date of Service: 07/26/2019 8:15 AM Medical Record Number: [id number] Patient Account Number: [id number] Date of Birth/Sex: [birth year] (39 y.o. M) Treating RN: Primary Care Provider: [name] Other Clinician: Referring Provider: [name] Treating Provider/Extender: Melburn Hake, Shyane Fossum Weeks in Treatment: 137 Active Problems ICD-10 Evaluated Encounter Code Description Active Date Today Diagnosis L97.222 Non-pressure chronic ulcer of left calf with fat layer exposed 12/04/2016 No Yes L88 Pyoderma gangrenosum 03/26/2017 No Yes I87.2 Venous insufficiency (chronic) (peripheral) 12/04/2016 No Yes L03.116 Cellulitis of left lower limb 05/24/2019 No Yes Inactive Problems ICD-10 Code Description Active Date Inactive Date L97.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 M) Treating RN: Primary Care Provider: [name] Other Clinician: Referring Provider: [name] Treating Provider/Extender: Melburn Hake, Kateria Cutrona Weeks in Treatment: 137 Subjective Chief Complaint Information obtained from Patient He is here in follow up evaluation for LLE pyoderma ulcer History of Present Illness (HPI) 12/04/16; 39 year old man who comes into the clinic today for review of a wound on the posterior left calf. He tells me that is been there for about a year. He is not a diabetic he does smoke half a pack per day. He was seen in the ER on 11/20/16 felt to have cellulitis around the wound and was given  clindamycin. An x-ray did not show osteomyelitis. The patient initially tells me that he has a milk allergy that sets off a pruritic itching rash on his lower legs which she scratches incessantly and he thinks that's what may have set up the wound. He has been using various topical antibiotics and ointments without any effect. He works in a [employer] and is on his feet all day. He does not have a prior history of wounds however he does have the rash on both lower legs the right arm and the ventral aspect of his left arm. These are excoriations and clearly have had scratching however there are of macular looking areas on both legs including a substantial larger area on the right leg. This does not have an underlying open area. There is no blistering. The patient tells me that 2 years ago in [place] in response to the rash on his legs he saw a dermatologist who told him he had a condition which may be pyoderma gangrenosum although I may be putting words into his mouth. He seemed to recognize this. 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 He is not in any treatment for this. He's had no recent travel 12/11/16; the patient arrives today with his wound and roughly the same condition we've been using silver  alginate this is a deep punched out wound with some surrounding erythema but no tenderness. Biopsy I did did not show confirmed pyoderma gangrenosum suggested nonspecific inflammation and vasculitis but does not provide an actual description of what was seen by the pathologist. I'm really not able to understand this We have also received information from the patient's dermatologist in [place] notes from April 2016. This was a doctor Agarwal-antal. 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 He was prescribed topical steroid high potency under occlusion which helped but at this point the patient did not have a deep punched out wound. 12/18/16; the patient's wound is larger in terms of surface area however this surface looks better and there is less depth. The surrounding erythema also is better. The patient states that the wrap we put on came off 2 days ago when he has been using his compression stockings. He we are in the process of getting a dermatology consult. 12/26/16 on evaluation today patient's left lower extremity wound shows evidence of infection with surrounding erythema noted. He has been tolerating the dressing changes but states that he has noted more discomfort. There is a larger area of erythema surrounding the wound. No fevers, chills, nausea, or vomiting noted at this time. With that being said the wound still does have slough covering the surface. He is not allergic to any medication that he is aware of at this point. In regard to his right lower extremity he had several regions that are erythematous and pruritic he wonders if there's anything we can do to help that. 01/02/17 I reviewed patient's wound culture which was obtained his visit last week. He was placed on doxycycline at that point. Unfortunately that does not appear to be an antibiotic that would likely help with the situation however the pseudomonas noted on culture is sensitive to Cipro. Also unfortunately  patient's wound seems to have a large compared to last week's evaluation. Not severely so but there are definitely increased measurements in general. He is continuing to have discomfort as well he writes this to be a seven out of 10. In fact he would prefer me not  to perform any debridement today due to the fact that he is having discomfort and considering he has an active infection on the little reluctant to do so anyway. No fevers, chills, nausea, or vomiting noted at this time. 01/08/17; patient seems dermatology on September 5. I suspect dermatology will want the slides from the biopsy I did sent to their pathologist. I'm not sure if there is a way we can expedite that. In any case the culture I did before I left on vacation 3 weeks ago showed Pseudomonas he was given 10 days of Cipro and per her description of her intake nurses is actually somewhat better this week although the wound is quite a bit bigger than I remember the last time I saw this. He still has 3 more days of Cipro 01/21/17; dermatology appointment tomorrow. He has completed the ciprofloxacin for Pseudomonas. Surface of the wound looks better however he is had some deterioration in the lesions on his right leg. Meantime the left lateral leg wound we will continue with sample 01/29/17; patient had his dermatology appointment but I can't yet see that note. He is completed his antibiotics. The wound is more superficial but considerably larger in circumferential area than when he came in. This is in his left lateral calf. He also has swollen erythematous areas with superficial wounds on the right leg and small papular areas on both arms. There apparently areas in her his upper thighs and buttocks I did not look at those. Dermatology biopsied the right leg. Hopefully will have their input next week. 02/05/17; patient went back to see his dermatologist who told him that he had a "scratching problem" as well as staph. He is now on a 30 day  course of doxycycline and I believe she gave him triamcinolone cream to the right leg areas to help with the itching [not exactly sure but probably triamcinolone]. She apparently looked at the left lateral leg wound although this was not rebiopsied and I think felt to be ultimately part of the same pathogenesis. He is using sample border foam and changing nevus himself. He now has a new open area on the right posterior leg which was his biopsy site I don't have any of the dermatology notes [name], [name] ([id number]) 02/12/17; we put the patient in compression last week with SANTYL to the wound on the left leg and the biopsy. Edema is much better and the depth of the wound is now at level of skin. Area is still the same Biopsy site on the right lateral leg we've also been using santyl with a border foam dressing and he is changing this himself. 02/19/17; Using silver alginate started last week to both the substantial left leg wound and the biopsy site on the right wound. He is tolerating compression well. Has a an appointment with his primary M.D. tomorrow wondering about diuretics although I'm wondering if the edema problem is actually lymphedema 02/26/17; the patient has been to see his primary doctor Dr. Jerrel Ivory at [facility] our primary care. She started him on Lasix 20 mg and this seems to have helped with the edema. However we are not making substantial change with the left lateral calf wound and inflammation. The biopsy site on the right leg also looks stable but not really all that different. 03/12/17; the patient has been to see vein and vascular Dr. Lucky Cowboy. He has had venous reflux studies I have not reviewed these. I did get a call from his dermatology office. They felt  that he might have pathergy based on their biopsy on his right leg which led them to look at the slides of the biopsy I did on the left leg and they wonder whether this represents pyoderma gangrenosum which was the original  supposition in a man with ulcerative colitis albeit inactive for many years. They therefore recommended clobetasol and tetracycline i.e. 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 03/26/17; apparently the patient just had reflux studies not an appointment with Dr. dew. She arrives in clinic today having applied clobetasol for 2-3 weeks. He notes over the last 2-3 days excessive drainage having to change the dressing 3-4 times a day and also expanding erythema. He states the expanding erythema seems to come and go and was last this red was earlier in the month.he is on doxycycline 150 mg twice a day as an anti- inflammatory systemic therapy for possible pyoderma gangrenosum along with the topical clobetasol 04/02/17; the patient was seen last week by Dr. Lillia Carmel at [facility] dermatology locally who kindly saw him at my request. 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 My concern was the degree of erythema medially extending from his left leg wound which was either inflammation from pyoderma or cellulitis. I put him on Augmentin however culture of the wound showed Pseudomonas which is quinolone sensitive. I really don't believe he has cellulitis however in view of everything I will continue and give him a course of Cipro. He is also on doxycycline as an immune modulator for the pyoderma. In addition to his original wound on the left lateral leg with surrounding erythema he has a wound on the right posterior calf which was an original biopsy site done by dermatology. 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 Saw Dr. Lillia Carmel yesterday. He has been using topical antibiotics to both wound areas his original wound on the left and the biopsies/pathergy area on the right. There is definitely some improvement in the inflammation around the wound on the right although the patient  states he has increasing sensitivity of the wounds. He is on prednisone 60 and doxycycline 1 as prescribed by Dr. Lillia Carmel. He is covering the topical antibiotic with gauze and putting this in his own compression stocks and changing this daily. He states that Dr. Lottie Rater did a culture of the left leg wound yesterday 05/07/17; pyoderma gangrenosum. The patient saw Dr. Lillia Carmel yesterday and has a follow-up with her in one month. He is still using topical antibiotics to both wounds although he can't recall exactly what type. He is still on prednisone 60 mg. Dr. Lillia Carmel stated that the doxycycline could stop if we were in agreement. 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 The right medial leg was induced by biopsy/pathergy. The area on the right is essentially healed. 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 The right medial leg has closed and remains closed. He is still on prednisone 60. He tells me he missed his last dermatology appointment with Dr. Lillia Carmel but will make another appointment. He reports that her blood sugar at a recent screen in [place] was high 200's. He was 180 today. 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 I think he is going to require still much longer prednisone perhaps another 3  months before attempting to taper. In the meantime his wound is a lot better. Smaller. He is cleaning this off daily and applying topical antibiotics. When he was last in the clinic I thought about changing to [facility] and actually put in a couple of calls to dermatology although probably not during their business hours. In any case the wound looks better smaller I don't think there is any need to change what he is doing 08/06/17-he is here in follow up evaluation for pyoderma left leg  ulcer. He continues on oral prednisone. He has been using triple antibiotic ointment. There is surface debris and we will transition to [facility] and have him return in 2 weeks. He has lost 30 pounds since his last appointment with lifestyle modification. He may benefit from topical steroid cream for treatment this can be considered at a later date. 08/22/17 on evaluation today patient appears to actually be doing rather well in regard to his left lateral lower extremity ulcer. He has actually been managed by Dr. Dellia Nims most recently. Patient is currently on oral steroids at this time. This seems to have been of benefit for him. Nonetheless his last visit was actually with Leah on 08/06/17. Currently he is not utilizing any topical steroid creams although this could be of benefit as well. No fevers, chills, nausea, or vomiting noted at this time. 09/05/17 on evaluation today patient appears to be doing better in regard to his left lateral lower extremity ulcer. He has been tolerating the dressing changes without complication. He is using Santyl with good effect. Overall I'm very pleased with how things are standing at this point. Patient likewise is happy that this is doing better. 09/19/17 on evaluation today patient actually appears to be doing rather well in regard to his left lateral lower extremity ulcer. 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 He's not having any significant pain. 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 He has been tolerating the Santyl without complication and in general I feel like he's making good progress. 10/17/17 on evaluation today patient appears to be doing very well in regard to his left lateral lower surety ulcer. He has been tolerating the dressing changes without complication. There does  not appear to be any evidence of infection he's alternating the Santyl and the triple antibiotic ointment every other day this seems to be doing well for him. 11/03/17 on evaluation today patient appears to be doing very well in regard to his left lateral lower extremity ulcer. He is been tolerating the dressing changes without complication which is good news. Fortunately there does not appear to be any evidence of infection which is also great news. Overall is doing excellent they are starting to taper down on the prednisone is down to 40 mg at this point it also started topical clobetasol for him. 11/17/17 on evaluation today patient appears to be doing well in regard to his left lateral lower surety ulcer. He's been tolerating the dressing changes without complication. He does note that he is having no pain, no excessive drainage or discharge, and overall he feels like things are going about how he would expect and hope they would. Overall he seems to have no evidence of infection at this time in my opinion which is good news. [name], [name] ([id number]) 12/04/17-He is seen in  follow-up evaluation for right lateral lower extremity ulcer. He has been applying topical steroid cream. Today's measurement show slight increase in size. Over the next 2 weeks we will transition to every other day Santyl and steroid cream. He has been encouraged to monitor for changes and notify clinic with any concerns 12/15/17 on evaluation today patient's left lateral motion the ulcer and fortunately is doing worse again at this point. This just since last week to this week has close to doubled in size according to the patient. I did not seeing last week's I do not have a visual to compare this to in our system was also down so we do not have all the charts and at this point. Nonetheless it does have me somewhat concerned in regard to the fact that again he was worried enough about it he has contact the dermatology that placed  them back on the full strength, 50 mg a day of the prednisone that he was taken previous. He continues to alternate using clobetasol along with Santyl at this point. He is obviously somewhat frustrated. 12/22/17 on evaluation today patient appears to be doing a little worse compared to last evaluation. Unfortunately the wound is a little deeper and slightly larger than the last week's evaluation. With that being said he has made some progress in regard to the irritation surrounding at this time unfortunately despite that progress that's been made he still has a significant issue going on here. 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 For that reason I discussed with him today the possibility of perform a wound culture to ensure there's nothing overtly infected. 01/06/18 on evaluation today patient's wound is larger and deeper than previously evaluated. With that being said it did appear that his wound was infected after my last evaluation with him. Subsequently I did end up prescribing a prescription for Bactrim DS which she has been taking and having no complication with. Fortunately there does not appear to be any evidence of infection at this point in time as far as anything spreading, no want to touch, and overall I feel like things are showing signs of improvement. 01/13/18 on evaluation today patient appears to be even a little larger and deeper than last time. There still muscle exposed in the base of the wound. Nonetheless he does appear to be less erythematous I do believe inflammation is calming down also believe the infection looks like it's probably resolved at this time based on what I'm seeing. No fevers, chills, nausea, or vomiting noted at this time. 01/30/18 on evaluation today patient actually appears to visually look better for the most  part. Unfortunately those visually this looks better he does seem to potentially have what may be an abscess in the muscle that has been noted in the central portion of the wound. This is the first time that I have noted what appears to be fluctuance in the central portion of the muscle. With that being said I'm somewhat more concerned about the fact that this might indicate an abscess formation at this location. I do believe that an ultrasound would be appropriate. This is likely something we need to try to do as soon as possible. He has been switch to mupirocin ointment and he is no longer using the steroid ointment as prescribed by dermatology he sees them again next week he's been decreased  from 60 to 40 mg of prednisone. 03/09/18 on evaluation today patient actually appears to be doing a little better compared to last time I saw him. There's not as much erythema surrounding the wound itself. He I did review his most recent infectious disease note which was dated 02/24/18. He saw Dr. Michel Bickers in [place]. 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 Patient is now off of antibiotics and they are continually observing this. There seems to be no change in the past two weeks in my pinion based on what the patient says and what I see today compared to what Dr. Megan Salon likely saw two weeks ago. No fevers, chills, nausea, or vomiting noted at this time. 03/23/18 on evaluation today patient's wound actually appears to be showing signs of improvement which is good news. He is currently still on the Dapsone. He is also working on tapering the prednisone to get off of this and Dr. Lottie Rater is working with him in this regard. Nonetheless overall I feel like the wound is doing well it does appear based on the infectious disease note that I reviewed from Dr. 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 I did also review the results of his ultrasound of left lower extremity which revealed there was a dentist tissue in the base of the wound without an abscess noted. 04/06/18 on evaluation today the patient's left lateral lower extremity ulcer actually appears to be doing fairly well which is excellent news. There does not appear to be any evidence of infection at this time which is also great news. Overall he still does have a significantly large ulceration although little by little he seems to be making progress. He is down to 10 mg a day of the prednisone. 04/20/18 on evaluation today patient actually appears to be doing excellent at this time in regard to his left lower extremity ulcer. He's making signs of good progress unfortunately this is taking much longer than we would really like to see but nonetheless he is making progress. Fortunately there does not appear to be any evidence of infection at this time. No fevers, chills, nausea, or vomiting noted at this time. The patient has not been using the Santyl due to the cost he hadn't got in this field yet. He's mainly been using the antibiotic ointment topically. Subsequently he also tells me that he really has not been scrubbing in the shower I think this would be helpful again as I told him it doesn't have to be anything too aggressive to even make it believe just enough to keep it free of some of the loose slough and biofilm on the wound surface. 05/11/18 on evaluation today patient's wound appears to be making slow but sure progress in regard to the left lateral lower extremity ulcer. He is been tolerating the dressing changes without complication. Fortunately there does not appear to be any evidence of infection at this time. He is still just using triple antibiotic ointment along with clobetasol occasionally over the area. He never got the Santyl and  really does not seem to intend to in my pinion. 06/01/18 on evaluation today patient appears to be doing a little better in regard to his left lateral lower extremity ulcer. He states that overall he does  not feel like he is doing as well with the Dapsone as he did with the prednisone. Nonetheless he sees his dermatologist later today and is gonna talk to them about the possibility of going back on the prednisone. Overall again I believe that the wound would be better if you would utilize Santyl but he really does not seem to be interested in going back to the [place] at this point. He has been using triple antibiotic ointment. 06/15/18 on evaluation today patient's wound actually appears to be doing about the same at this point. Fortunately there is no signs of infection at this time. He has made slight improvements although he continues to not really want to clean the wound bed at this point. He states that he just doesn't mess with it he doesn't want to cause any problems with everything else he has going on. He has been on medication, antibiotics as prescribed by his dermatologist, for a staff infection of his lower extremities which is really drying out now and looking much better he tells me. Fortunately there is no sign of overall infection. 06/29/18 on evaluation today patient appears to be doing well in regard to his left lateral lower surety ulcer all things considering. Fortunately his staff infection seems to be greatly improved compared to previous. He has no signs of infection and this is drying up quite nicely. He is still the doxycycline Reith, Claborn ([id number]) for this is no longer on cental, Dapsone, or any of the other medications. His dermatologist has recommended possibility of an infusion but right now he does not want to proceed with that. 07/13/18 on evaluation today patient appears to be doing about the same in regard to his left lateral lower surety ulcer. Fortunately there's no  signs of infection at this time which is great news. Unfortunately he still builds up a significant amount of Slough/biofilm of the surface of the wound he still is not really cleaning this as he should be appropriately. Again I'm able to easily with saline and gauze remove the majority of this on the surface which if you would do this at home would likely be a dramatic improvement for him as far as getting the area to improve. Nonetheless overall I still feel like he is making progress is just very slow. I think Santyl will be of benefit for him as well. Still he has not gotten this as of this point. 07/27/18 on evaluation today patient actually appears to be doing little worse in regards of the erythema around the periwound region of the wound he also tells me that he's been having more drainage currently compared to what he was experiencing last time I saw him. He states not quite as bad as what he had because this was infected previously but nonetheless is still appears to be doing poorly. Fortunately there is no evidence of systemic infection at this point. The patient tells me that he is not going to be able to afford the Santyl. He is still waiting to hear about the infusion therapy with his dermatologist. Apparently she wants an updated colonoscopy first. 08/10/18 on evaluation today patient appears to be doing better in regard to his left lateral lower extremity ulcer. Fortunately he is showing signs of improvement in this regard he's actually been approved for Remicade infusion's as well although this has not been scheduled as of yet. Fortunately there's no signs of active infection at this time in regard to the wound although he is having some  issues with infection of the right lower extremity is been seen as dermatologist for this. Fortunately they are definitely still working with him trying to keep things under control. 09/07/18 on evaluation today patient is actually doing rather well in  regard to his left lateral lower extremity ulcer. He notes these actually having some hair grow back on his extremity which is something he has not seen in years. He also tells me that the pain is really not giving them any trouble at this time which is also good news overall she is very pleased with the progress he's using a combination of the mupirocin along with the probate is all mixed. 09/21/18 on evaluation today patient actually appears to be doing fairly well all things considered in regard to his looks from the ulcer. He's been tolerating the dressing changes without complication. Fortunately there's no signs of active infection at this time which is good news he is still on all antibiotics or prevention of the staff infection. He has been on prednisone for time although he states it is gonna contact his dermatologist and see if she put them on a short course due to some irritation that he has going on currently. Fortunately there's no evidence of any overall worsening this is going very slow I think cental would be something that would be helpful for him although he states that $50 for tube is quite expensive. He therefore is not willing to get that at this point. 10/06/18 on evaluation today patient actually appears to be doing decently well in regard to his left lateral leg ulcer. He's been tolerating the dressing changes without complication. Fortunately there's no signs of active infection at this time. Overall I'm actually rather pleased with the progress he's making although it's slow he doesn't show any signs of infection and he does seem to be making some improvement. I do believe that he may need a switch up and dressings to try to help this to heal more appropriately and quickly. 10/19/18 on evaluation today patient actually appears to be doing better in regard to his left lateral lower extremity ulcer. This is shown signs of having much less Slough buildup at this point due to the fact  he has been using the [employer]. Obviously this is very good news. The overall size of the wound is not dramatically smaller but again the appearance is. 11/02/18 on evaluation today patient actually appears to be doing quite well in regard to his lower [place] ulcer. A lot of the skin around the ulcer is actually somewhat irritating at this point this seems to be more due to the dressing causing irritation from the adhesive that anything else. Fortunately there is no signs of active infection at this time. 11/24/18 on evaluation today patient appears to be doing a little worse in regard to his overall appearance of his lower extremity ulcer. There's more erythema and warmth around the wound unfortunately. He is currently on doxycycline which he has been on for some time. With that being said I'm not sure that seems to be helping with what appears to possibly be an acute cellulitis with regard to his left lower extremity ulcer. No fevers, chills, nausea, or vomiting noted at this time. 12/08/18 on evaluation today patient's wounds actually appears to be doing significantly better compared to his last evaluation. He has been using Santyl along with alternating tripling about appointment as well as the steroid cream seems to be doing quite well and the wound is showing  signs of improvement which is excellent news. Fortunately there's no evidence of infection and in fact his culture came back negative with only normal skin flora noted. 12/21/2018 upon evaluation today patient actually appears to be doing excellent with regard to his ulcer. This is actually the best that I have seen it since have been helping to take care of him. It is both smaller as well as less slough noted on the surface of the wound and seems to be showing signs of good improvement with new skin growing from the edges. He has been using just the triamcinolone he does wonder if he can get a refill of that ointment today. 01/04/2019 upon  evaluation today patient actually appears to be doing well with regard to his left lateral lower extremity ulcer. With that being said it does not appear to be that he is doing quite as well as last time as far as progression is concerned. There does not appear to be any signs of infection or significant irritation which is good news. With that being said I do believe that he may benefit from switching to a collagen based dressing based on how clean The wound appears. 01/18/2019 on evaluation today patient actually appears to be doing well with regard to his wound on the left lower extremity. He is not made a lot of progress compared to where we were previous but nonetheless does seem to be doing okay at this time which is good news. There is no signs of active infection which is also good news. My only concern currently is I do wish we can get him into utilizing the collagen dressing his insurance would not pay for the supplies that we ordered although it appears that he may be able to order this through his supply company that he typically utilizes. This is [employer]. Nonetheless he did try to order it during the office visit today and it appears this did go through. We will see if he can get that it is a different brand but nonetheless he has collagen and I do think will be beneficial. [name], [name] ([id number]) 02/01/2019 on evaluation today patient actually appears to be doing a little worse today in regard to the overall size of his wounds. Fortunately there is no signs of active infection at this time. That is visually. Nonetheless when this is happened before it was due to infection. For that reason were somewhat concerned about that this time as well. 02/08/2019 on evaluation today patient unfortunately appears to be doing slightly worse with regard to his wound upon evaluation today. Is measuring a little deeper and a little larger unfortunately. I am not really sure exactly what is causing this  to enlarge he actually did see his dermatologist she is going to see about initiating Humira for him. Subsequently she also did do steroid injections into the wound itself in the periphery. Nonetheless still nonetheless he seems to be getting a little bit larger he is gone back to just using the steroid cream topically which I think is appropriate. I would say hold off on the collagen for the time being is definitely a good thing to do. Based on the culture results which we finally did get the final result back regarding it shows staph as the bacteria noted again that can be a normal skin bacteria based on the fact however he is having increased drainage and worsening of the wound measurement wise I would go ahead and place him on an antibiotic today  I do believe for this. 02/15/2019 on evaluation today patient actually appears to be doing somewhat better in regard to his ulcer. There is no signs of worsening at this time I did review his culture results which showed evidence of Staphylococcus aureus but not MRSA. Again this could just be more related to the normal skin bacteria although he states the drainage has slowed down quite a bit he may have had a mild infection not just colonization. And was much smaller and then since around10/04/2019 on evaluation today patient appears to be doing unfortunately worse as far as the size of the wound. I really feel like that this is steadily getting larger again it had been doing excellent right at the beginning of September we have seen a steady increase in the area of the wound it is almost 2-1/2 times the size it was on September 1. Obviously this is a bad trend this is not wanting to see. For that reason we went back to using just the topical triamcinolone cream which does seem to help with inflammation. I checked him for bacteria by way of culture and nothing showed positive there. I am considering giving him a short course of a tapering steroid Dosepak  today to see if that is can be beneficial for him. The patient is in agreement with giving that a try. 03/08/2019 on evaluation today patient appears to be doing very well in comparison to last evaluation with regard to his lower extremity ulcer. This is showing signs of less inflammation and actually measuring slightly smaller compared to last time every other week over the past month and a half he has been measuring larger larger larger. Nonetheless I do believe that the issue has been inflammation the prednisone does seem to have been beneficial for him which is good news. No fevers, chills, nausea, vomiting, or diarrhea. 03/22/2019 on evaluation today patient appears to be doing about the same with regard to his leg ulcer. He has been tolerating the dressing changes without complication. With that being said the wound seems to be mostly arrested at its current size but really is not making any progress except for when we prescribed the prednisone. He did show some signs of dropping as far as the overall size of the wound during that interval week. Nonetheless this is something he is not on long-term at this point and unfortunately I think he is getting need either this or else the Humira which his dermatologist has discussed try to get approval for. With that being said he will be seeing his dermatologist on the 11th of this month that is November. 04/19/2019 on evaluation today patient appears to be doing really about the same the wound is measuring slightly larger compared to last time I saw him. He has not been into the office since November 2 due to the fact that he unfortunately had Covid as that his entire family. He tells me that it was rough but they did pull-through and he seems to be doing much better. Fortunately there is no signs of active infection at this time. No fevers, chills, nausea, vomiting, or diarrhea. 05/10/2019 on evaluation today patient unfortunately appears to be doing  significantly worse as compared to last time I saw him. He does tell me that he has had his first dose of Humira and actually is scheduled to get the next one in the upcoming week. With that being said he tells me also that in the past several days he has  been having a lot of issues with green drainage she showed me a picture this is more blue-green in color. He is also been having issues with increased sloughy buildup and the wound does appear to be larger today. Obviously this is not the direction that we want everything to take based on the starting of his Humira. Nonetheless I think this is definitely a result of likely infection and to be honest I think this is probably Pseudomonas causing the infection based on what I am seeing. 05/24/2019 on evaluation today patient unfortunately appears to be doing significantly worse compared to his prior evaluation with me 2 weeks ago. I did review his culture results which showed that he does have Staph aureus as well as Pseudomonas noted on the culture. Nonetheless the Levaquin that I prescribed for him does not appear to have been appropriate and in fact he tells me he is no longer experiencing the green drainage and discharge that he had at the last visit. Fortunately there is no signs of active infection at this time which is good news although the wound has significantly worsened it in fact is much deeper than it was previous. We have been utilizing up to this point triamcinolone ointment as the prescription topical of choice but at this time I really feel like that the wound is getting need to be packed in order to appropriately manage this due to the deeper nature of the wound. Therefore something along the lines of an alginate dressing may be more appropriate. 05/31/2019 upon inspection today patient's wound actually showed signs of doing poorly at this point. Unfortunately he just does not seem to be making any good progress despite what we have tried.  He actually did go ahead and pick up the Cipro and start taking that as he was noticing more green drainage he had previously completed the Levaquin that I prescribed for him as well. Nonetheless he missed his appointment for the seventh last week on Wednesday with the wound care center and [facility] where his dermatologist referred him. Obviously I do think a second opinion would be helpful at this point especially in light of the fact that the patient seems to be doing so poorly despite the fact that we have tried everything that I really know how at this point. The only thing that ever seems to have helped him in the past is when he was on high doses of continual steroids that did seem to make a difference for him. 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 06/07/2019 upon evaluation today patient unfortunately appears to be doing worse yet again with regard to his wound. In fact I am starting to question whether or not he may have a fluid pocket in the muscle at this point based on the bulging and the soft appearance to the central portion of the muscle area. There is not anything draining from the muscle itself at this time which is good news but nonetheless the wound is expanding. I am not really seeing any results of the Humira as far as overall wound progression based on what I am seeing at this point. The patient has been referred for second opinion with regard to his wound to the [facility] wound care center by his dermatologist which I definitely am not  in opposition to. Unfortunately we tried multiple dressings in the past including collagen, alginate, and at one point even Hydrofera Blue. With that being said he is never really used it for any significant amount of time due to the fact that he often complains of pain associated with  these dressings and then will go back to either using the Santyl which she has done intermittently or more frequently the triamcinolone. He is also using his own compression stockings. We have wrapped him in the past but again that was something else that he really was not a big fan of. 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 He seemed to do much better when he was on the steroids although honestly I understand that the steroids are not necessarily the best medication to be on long-term obviously 06/14/2019 on evaluation today patient appears to be doing actually a little bit better with regard to the overall appearance with his leg. Unfortunately he does continue to have issues with what appears to be some fluid underneath the muscle although he did see the wound specialty center at [facility] last week their main goals were to see about infusion therapy in place of the Humira as they feel like that is not quite strong enough. They also recommended that we continue with the treatment otherwise as we are they felt like that was appropriate and they are okay with him continuing to follow-up here with [place] in that regard. With that being said they are also sending him to the vein specialist there to see about vein stripping and if that would be of benefit for him. Subsequently they also did not really address whether or not an ultrasound of the muscle area to see if there is anything that needs to be addressed here would be appropriate or not. For that reason I discussed this with him last week I think we may proceed down that road at this point. 06/21/2019 upon  evaluation today patient's wound actually appears to be doing slightly better compared to previous evaluations. I do believe that he has made a difference with regard to the progression here with the use of oral steroids. Again in the past has been the only thing that is really calm things down. He does tell me that from [facility] is gotten a good news from there that there are no further vein stripping that is necessary at this point. I do not have that available for review today although the patient did relay this to me. He also did obtain and have the ultrasound of the wound completed which I did sign off on today. It does appear that there is no fluid collection under the muscle this is likely then just edematous tissue in general. That is also good news. Overall I still believe the inflammation is the main issue here. He did inquire about the possibility of a wound VAC again with the muscle protruding like it is I am not really sure whether the wound VAC is necessarily ideal or not. That is something we will have to consider although I do believe he may need compression wrapping to try  to help with edema control which could potentially be of benefit. 06/28/2019 on evaluation today patient appears to be doing slightly better measurement wise although this is not terribly smaller he least seems to be trending towards that direction. With that being said he still seems to have purulent drainage noted in the wound bed at this time. He has been on Levaquin followed by Cipro over the past month. Unfortunately he still seems to have some issues with active infection at this time. I did perform a culture last week in order to evaluate and see if indeed there was still anything going on. Subsequently the culture did come back showing Pseudomonas which is consistent with the drainage has been having which is blue-green in color. He also has had an odor that again was somewhat consistent with Pseudomonas as well.  Long story short it appears that the culture showed an intermediate finding with regard to how well the Cipro will work for the Pseudomonas infection. Subsequently being that he does not seem to be clearing up and at best what we are doing is just keeping this at [place] I think he may need to see infectious disease to discuss IV antibiotic options. 07/05/2019 upon evaluation today patient appears to be doing okay in regard to his leg ulcer. He has been tolerating the dressing changes at this point without complication. Fortunately there is no signs of active infection at this time which is good news. No fevers, chills, nausea, vomiting, or diarrhea. With that being said he does have an appointment with infectious disease tomorrow and his primary care on Wednesday. Again the reason for the infectious disease referral was due to the fact that he did not seem to be fully resolving with the use of oral antibiotics and therefore we were thinking that IV antibiotic therapy may be necessary secondary to the fact that there was an intermediate finding for how effective the Cipro may be. Nonetheless again he has been having a lot of purulent and even green drainage. Fortunately right now that seems to have calmed down over the past week with the reinitiation of the oral antibiotic. Nonetheless we will see what Dr. Megan Salon has to say. 07/12/2019 upon evaluation today patient appears to be doing about the same at this point in regard to his left lower extremity ulcer. Fortunately there is no signs of active infection at this time which is good news I do believe the Levaquin has been beneficial I did review Dr. Hale Bogus note and to be honest I agree that the patient's leg does appear to be doing better currently. What we found in the past as he does not seem to really completely resolve he will stop the antibiotic and then subsequently things will revert back to having issues with blue-green drainage, increased pain,  and overall worsening in general. Obviously that is the reason I sent him back to infectious disease. 07/19/2019 upon evaluation today patient appears to be doing roughly the same in size there is really no dramatic improvement. He has started back on the Levaquin at this point and though he seems to be doing okay he did still have a lot of blue/green drainage noted on evaluation today unfortunately. I think that this is still indicative more likely of a Pseudomonas infection as previously noted and again he does see Dr. Megan Salon in just a couple of days. I do not know that were really able to effectively clear this with just oral antibiotics alone based on what I  am seeing currently. Nonetheless we are still continue to try to manage as best we can with regard to the patient and his wound. I do think the wrap was helpful in decreasing the edema which is excellent news. No fevers, chills, nausea, vomiting, or diarrhea. 07/26/2019 upon evaluation today patient appears to be doing slightly better with regard to the overall appearance of the muscle there is no dark discoloration centrally. Fortunately there is no signs of active infection at this time. No fevers, chills, nausea, vomiting, or diarrhea. Patient's wound bed currently the patient did have an appointment with Dr. Megan Salon at infectious disease last week. With that being said Dr. Megan Salon the patient states was still somewhat hesitant about put him on any IV antibiotics he wanted [place] to repeat cultures today and then see where things go going forward. He does look like Dr. Megan Salon because of some improvement the patient did have with the Levaquin wanted [place] to see about repeating cultures. If it indeed grows the Pseudomonas again then he recommended a possibility of considering a PICC line placement and IV antibiotic therapy. He plans to see the patient back in 1 to 2 weeks. Objective Labrada, Kenta ([id number]) Constitutional Well-nourished and  well-hydrated in no acute distress. Vitals Time Taken: 8:18 AM, Height: 71 in, Weight: 338 lbs, BMI: 47.1, Temperature: 99.0 F, Pulse: 80 bpm, Respiratory Rate: 16 breaths/min, Blood Pressure: 143/85 mmHg. Respiratory normal breathing without difficulty. Psychiatric this patient is able to make decisions and demonstrates good insight into disease process. Alert and Oriented x 3. pleasant and cooperative. General Notes: Patient's wound currently does appear to be showing some signs of slight improvement. There is no evidence of active systemic infection which is good news. He does have still purulent drainage noted which is somewhat bright yellow/slightly green tinted. We are using Sorbact at this point. Integumentary (Hair, Skin) Wound #1 status is Open. Original cause of wound was Gradually Appeared. The wound is located on the Left,Lateral Lower Leg. The wound measures 6.1cm length x 6.2cm width x 0.8cm depth; 29.704cm^2 area and 23.763cm^3 volume. There is muscle and Fat Layer (Subcutaneous Tissue) Exposed exposed. There is a large amount of serous drainage noted. The wound margin is epibole. There is medium (34-66%) pink granulation within the wound bed. There is a medium (34-66%) amount of necrotic tissue within the wound bed including Adherent Slough. 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 There was a Three Layer Compression Therapy Procedure by Army Melia, RN. Post procedure Diagnosis Wound #1: Same as Pre-Procedure Plan Wound Cleansing: Wound #1 Left,Lateral Lower Leg: Clean wound with Normal Saline. Anesthetic (add to Medication List): Wound #1 Left,Lateral Lower Leg: Topical Lidocaine 4% cream applied to wound  bed prior to debridement (In Clinic Only). Skin Barriers/Peri-Wound Care: Wound #1 Left,Lateral Lower Leg: Triamcinolone Acetonide Ointment (TCA) Primary Wound Dressing: Other: - Sorbact Secondary Dressing: RALSTON, VENUS ([id number]) Wound #1 Left,Lateral Lower Leg: XtraSorb Dressing Change Frequency: Wound #1 Left,Lateral Lower Leg: Dressing is to be changed Monday and Thursday. Follow-up Appointments: Wound #1 Left,Lateral Lower Leg: Return Appointment in 1 week. Nurse Visit as needed Edema Control: Wound #1 Left,Lateral Lower Leg: 3 Layer Compression System - Left Lower Extremity Elevate legs to the level of the heart and pump ankles as often as possible Medications-please  add to medication list.: Wound #1 Left,Lateral Lower Leg: P.O. Antibiotics - Continue antibiotics 1. I would recommend currently that we go ahead and initiate treatment with a continuation of the Sorbact at this point. The patient seems to have done well over the past week which is good news. We will use Xtrasorb over top of this followed by the 3 layer compression wrap. 2. I am also going to suggest the patient continue to avoid any additional oral antibiotics for the time being we will see how things do. Subsequently the patient will also be have an appointment with Dr. Megan Salon following up in 1-2 weeks. 3. He is still working on the approval for the infusion therapy for the pyoderma gangrenosum. Hopefully have that approved and ready shortly. We will see patient back for reevaluation in 1 week here in the clinic. If anything worsens or changes patient will contact our office for additional recommendations. Electronic Signature(s) Signed: 07/26/2019 9:06:33 AM By: Worthy Keeler PA-C Entered By: Worthy Keeler on 07/26/2019 09:06:33 LANGDON, CROSSON ([id number]) -------------------------------------------------------------------------------- SuperBill Details Patient Name: [name] Date of Service:  07/26/2019 Medical Record Number: [id number] Patient Account Number: [id number] Date of Birth/Sex: 05-05-1981 (39 y.o. M) Treating RN: Army Melia Primary Care Provider: [name] Other Clinician: Referring Provider: [name] Treating Provider/Extender: Melburn Hake, Lavonte Palos Weeks in Treatment: 137 Diagnosis Coding ICD-10 Codes Code Description [phone] Non-pressure chronic ulcer of left calf with fat layer exposed L88 Pyoderma gangrenosum I87.2 Venous insufficiency (chronic) (peripheral) L03.116 Cellulitis of left lower limb Facility Procedures CPT4 Code: 37106269 Description: (Facility Use Only) [phone] - [place] LWR LT LEG Modifier: Quantity: 1 Physician Procedures CPT4 Code: 0350093 Description: 81829 - WC PHYS LEVEL 4 - EST PT Modifier: Quantity: 1 CPT4 Code: Description: ICD-10 Diagnosis Description L97.222 Non-pressure chronic ulcer of left calf with fat layer exposed L88 Pyoderma gangrenosum I87.2 Venous insufficiency (chronic) (peripheral) L03.116 Cellulitis of left lower limb Modifier: Quantity: Electronic Signature(s) Signed: 07/26/2019 9:06:49 AM By: Worthy Keeler PA-C Entered By: Worthy Keeler on 07/26/2019 09:06:49

## 2019-07-26 NOTE — Progress Notes (Signed)
MANNY, VITOLO ([id number]) Visit Report for 07/26/2019 Arrival Information Details Patient Name: [name], [name] Date of Service: 07/26/2019 8:15 AM Medical Record Number: [id number] Patient Account Number: [id number] Date of Birth/Sex: March 01, 1981 (39 y.o. M) Treating RN: Army Melia Primary Care Ayslin Kundert: [name] Other Clinician: Referring Keiran Sias: [name] Treating Kincade Granberg/Extender: Melburn Hake, [name]: 48 Visit Information History Since Last Visit Added or deleted any medications: No Patient Arrived: Ambulatory Any new allergies or adverse reactions: No Arrival Time: 08:17 Had a fall or experienced change in No Accompanied By: self activities of daily living that may affect Transfer Assistance: None risk of falls: Patient Identification Verified: Yes Signs or symptoms of abuse/neglect since last visito No Patient Requires Transmission-Based Precautions: No Hospitalized since last visit: No Patient Has Alerts: No Has Dressing in Place as Prescribed: Yes Pain Present Now: Yes Electronic Signature(s) Signed: 07/26/2019 4:58:25 PM By: Army Melia Entered By: Army Melia on 07/26/2019 08:18:03 [name] ([id number]) -------------------------------------------------------------------------------- Compression Therapy Details Patient Name: [name] Date of Service: 07/26/2019 8:15 AM Medical Record Number: [id number] Patient Account Number: [id number] Date of Birth/Sex: [birth year] (39 y.o. M) Treating RN: Army Melia Primary Care Keithon Mccoin: [name] Other Clinician: Referring Nikeia Henkes: [name] Treating Eldred Lievanos/Extender: Melburn Hake, [name]: 137 Compression Therapy Performed for Wound Assessment: Wound #1 Left,Lateral Lower Leg Performed By: Clinician Army Melia, RN Compression Type: Three Layer Post Procedure Diagnosis Same as Pre-procedure Electronic Signature(s) Signed: 07/26/2019 4:58:25 PM By: Army Melia Entered By: Army Melia on 07/26/2019 08:56:06 [name] ([id number]) -------------------------------------------------------------------------------- Encounter Discharge Information Details Patient Name: [name] Date of Service: 07/26/2019 8:15 AM Medical Record Number: [id number] Patient Account Number: [id number] Date of Birth/Sex: February 04, 1981 (39 y.o. M) Treating RN: Army Melia Primary Care Byan Poplaski: [name] Other Clinician: Referring Simra Fiebig: [name] Treating Heavenlee Maiorana/Extender: Melburn Hake, [name]: 137 Encounter Discharge Information Items Discharge Condition: Stable Ambulatory Status: Ambulatory Discharge Destination: Home Transportation: Private Auto Accompanied By: self Schedule Follow-up Appointment: Yes Clinical Summary of Care: Electronic Signature(s) Signed: 07/26/2019 4:58:25 PM By: Army Melia Entered By: Army Melia on 07/26/2019 08:57:22 Heinze, [name] ([id number]) -------------------------------------------------------------------------------- Lower Extremity Assessment Details Patient Name: [name] Date of Service: 07/26/2019 8:15 AM Medical Record Number: [id number] Patient Account Number: [id number] Date of Birth/Sex: 1980-12-23 (39 y.o. M) Treating RN: Army Melia Primary Care Jaxon Mynhier: [name] Other Clinician: Referring Corneisha Alvi: [name] Treating Marshawn Ninneman/Extender: Melburn Hake, [name]: 137 Edema Assessment Assessed: [Left: No] [Right: No] Edema: [Left: Ye] [Right: s] Calf Left: Right: Point of Measurement: 32 cm From Medial Instep 48 cm cm Ankle Left: Right: Point of Measurement: 10 cm From Medial Instep 29.5 cm cm Vascular Assessment Pulses: Dorsalis Pedis Palpable: [Left:Yes] Electronic Signature(s) Signed: 07/26/2019 4:58:25 PM By: Army Melia Entered By: Army Melia on 07/26/2019 08:27:17 [name]  ([id number]) -------------------------------------------------------------------------------- Multi Wound Chart Details Patient Name: [name] Date of Service: 07/26/2019 8:15 AM Medical Record Number: [id number] Patient Account Number: [id number] Date of Birth/Sex: 1980/08/30 (39 y.o. M) Treating RN: Army Melia Primary Care Sal Spratley: [name] Other Clinician: Referring Branson Kranz: [name] Treating Berdene Askari/Extender: Melburn Hake, [name]: 137 Vital Signs Height(in): 71 Pulse(bpm): 80 Weight(lbs): 338 Blood Pressure(mmHg): 143/85 Body Mass Index(BMI): 47 Temperature(F): 99.0 Respiratory Rate(breaths/min): 16 Photos: [N/A:N/A] Wound Location: Left Lower Leg - Lateral N/A N/A Wounding Event: Gradually Appeared N/A N/A Primary Etiology: Pyoderma N/A N/A Comorbid History: Sleep Apnea, Hypertension, Colitis N/A N/A Date Acquired: 11/18/2015 N/A N/A Weeks of Treatment: 137 N/A  N/A Wound Status: Open N/A N/A Measurements L x W x D (cm) 6.1x6.2x0.8 N/A N/A Area (cm) : 29.704 N/A N/A Volume (cm) : 23.763 N/A N/A % Reduction in Area: -505.10% N/A N/A % Reduction in Volume: -505.10% N/A N/A Classification: Full Thickness With Exposed N/A N/A Support Structures Exudate Amount: Large N/A N/A Exudate Type: Serous N/A N/A Exudate Color: amber N/A N/A Wound Margin: Epibole N/A N/A Granulation Amount: Medium (34-66%) N/A N/A Granulation Quality: Pink N/A N/A Necrotic Amount: Medium (34-66%) N/A N/A Exposed Structures: Fat Layer (Subcutaneous Tissue) N/A N/A Exposed: Yes Muscle: Yes Fascia: No Tendon: No Joint: No Bone: No Epithelialization: None N/A N/A Treatment Notes Electronic Signature(s) Signed: 07/26/2019 4:58:25 PM By: Army Melia Entered By: Army Melia on 07/26/2019 08:52:50 Deerman, [name] ([id number]) Bethanne Ginger, [name] ([id number]) -------------------------------------------------------------------------------- Multi-Disciplinary Care Plan  Details Patient Name: [name] Date of Service: 07/26/2019 8:15 AM Medical Record Number: [id number] Patient Account Number: [id number] Date of Birth/Sex: May 02, 1981 (39 y.o. M) Treating RN: Army Melia Primary Care Addison Whidbee: [name] Other Clinician: Referring Lockie Bothun: [name] Treating Yanis Larin/Extender: Melburn Hake, [name]: 137 Active Inactive Venous Leg Ulcer Nursing Diagnoses: Knowledge deficit related to disease process and management Potential for venous Insuffiency (use before diagnosis confirmed) Goals: Non-invasive venous studies are completed as ordered Date Initiated: 12/11/2016 Target Resolution Date: 03/13/2017 Goal Status: Active Patient will maintain optimal edema control Date Initiated: 12/11/2016 Target Resolution Date: 03/13/2017 Goal Status: Active Patient/caregiver will verbalize understanding of disease process and disease management Date Initiated: 12/11/2016 Target Resolution Date: 03/13/2017 Goal Status: Active Verify adequate tissue perfusion prior to therapeutic compression application Date Initiated: 12/11/2016 Target Resolution Date: 03/13/2017 Goal Status: Active Interventions: Assess peripheral edema status every visit. Compression as ordered Provide education on venous insufficiency Treatment Activities: Therapeutic compression applied : 12/11/2016 Notes: Wound/Skin Impairment Nursing Diagnoses: Impaired tissue integrity Knowledge deficit related to ulceration/compromised skin integrity Goals: Patient/caregiver will verbalize understanding of skin care regimen Date Initiated: 12/11/2016 Target Resolution Date: 03/13/2017 Goal Status: Active Ulcer/skin breakdown will have a volume reduction of 30% by week 4 Date Initiated: 12/11/2016 Target Resolution Date: 03/13/2017 Goal Status: Active Ulcer/skin breakdown will have a volume reduction of 50% by week 8 Date Initiated: 12/11/2016 Target Resolution Date:  03/13/2017 Goal Status: Active Ulcer/skin breakdown will have a volume reduction of 80% by week 12 Date Initiated: 12/11/2016 Target Resolution Date: 03/13/2017 NEYTHAN, KOZLOV ([id number]) Goal Status: Active Ulcer/skin breakdown will heal within 14 weeks Date Initiated: 12/11/2016 Target Resolution Date: 03/13/2017 Goal Status: Active Interventions: Assess patient/caregiver ability to obtain necessary supplies Assess patient/caregiver ability to perform ulcer/skin care regimen upon admission and as needed Assess ulceration(s) every visit Provide education on ulcer and skin care Treatment Activities: Skin care regimen initiated : 12/11/2016 Topical wound management initiated : 12/11/2016 Notes: Electronic Signature(s) Signed: 07/26/2019 4:58:25 PM By: Army Melia Entered By: Army Melia on 07/26/2019 08:52:39 [name] ([id number]) -------------------------------------------------------------------------------- Pain Assessment Details Patient Name: [name] Date of Service: 07/26/2019 8:15 AM Medical Record Number: [id number] Patient Account Number: [id number] Date of Birth/Sex: [birth year] (39 y.o. M) Treating RN: Army Melia Primary Care Linnie Delgrande: [name] Other Clinician: Referring Perpetua Elling: [name] Treating Naveh Rickles/Extender: Melburn Hake, [name]: 137 Active Problems Location of Pain Severity and Description of Pain Patient Has Paino Yes Site Locations Pain Location: Pain in Ulcers Rate the pain. Current Pain Level: 1 Pain Management and Medication Current Pain Management: Electronic Signature(s) Signed: 07/26/2019 4:58:25 PM By: Army Melia Entered By: Army Melia on 07/26/2019 08:19:09  BRAEDEN, KENNAN ([id number]) -------------------------------------------------------------------------------- Patient/Caregiver Education Details Patient Name: JADIE, COMAS Date of Service: 07/26/2019 8:15 AM Medical Record Number: [id number] Patient  Account Number: [id number] Date of Birth/Gender: May 25, 1980 (39 y.o. M) Treating RN: Army Melia Primary Care Physician: [name] Other Clinician: Referring Physician: [name] Treating Physician/Extender: Sharalyn Ink in Treatment: 137 Education Assessment Education Provided To: Patient Education Topics Provided Wound/Skin Impairment: Handouts: Caring for Your Ulcer Methods: Demonstration, Explain/Verbal Responses: State content correctly Electronic Signature(s) Signed: 07/26/2019 4:58:25 PM By: Army Melia Entered By: Army Melia on 07/26/2019 08:56:58 Pollitt, [name] ([id number]) -------------------------------------------------------------------------------- Wound Assessment Details Patient Name: [name] Date of Service: 07/26/2019 8:15 AM Medical Record Number: [id number] Patient Account Number: [id number] Date of Birth/Sex: 12-01-1980 (39 y.o. M) Treating RN: Army Melia Primary Care Lamorris Knoblock: [name] Other Clinician: Referring Eason Housman: [name] Treating Candid Bovey/Extender: Melburn Hake, [name]: 137 Wound Status Wound Number: 1 Primary Etiology: Pyoderma Wound Location: Left Lower Leg - Lateral Wound Status: Open Wounding Event: Gradually Appeared Comorbid History: Sleep Apnea, Hypertension, Colitis Date Acquired: 11/18/2015 Weeks Of Treatment: 137 Clustered Wound: No Photos Wound Measurements Length: (cm) 6.1 Width: (cm) 6.2 Depth: (cm) 0.8 Area: (cm) 29.704 Volume: (cm) 23.763 % Reduction in Area: -505.1% % Reduction in Volume: -505.1% Epithelialization: None Wound Description Classification: Full Thickness With Exposed Support Structur Wound Margin: Epibole Exudate Amount: Large Exudate Type: Serous Exudate Color: amber es Foul Odor After Cleansing: No Slough/Fibrino Yes Wound Bed Granulation Amount: Medium (34-66%) Exposed Structure Granulation Quality: Pink Fascia Exposed: No Necrotic Amount:  Medium (34-66%) Fat Layer (Subcutaneous Tissue) Exposed: Yes Necrotic Quality: Adherent Slough Tendon Exposed: No Muscle Exposed: Yes Necrosis of Muscle: No Joint Exposed: No Bone Exposed: No Treatment Notes Wound #1 (Left, Lateral Lower Leg) Notes TCA, sorbact, xsorb, 3 layer left (unna to anchor) Electronic Signature(s) IDEN, STRIPLING ([id number]) Signed: 07/26/2019 4:58:25 PM By: Army Melia Entered By: Army Melia on 07/26/2019 08:26:05 [name] ([id number]) -------------------------------------------------------------------------------- Vitals Details Patient Name: [name] Date of Service: 07/26/2019 8:15 AM Medical Record Number: [id number] Patient Account Number: [id number] Date of Birth/Sex: 1980-07-28 (39 y.o. M) Treating RN: Army Melia Primary Care Tomi Grandpre: [name] Other Clinician: Referring Bay Jarquin: [name] Treating Cesia Orf/Extender: Melburn Hake, [name]: 137 Vital Signs Time Taken: 08:18 Temperature (F): 99.0 Height (in): 71 Pulse (bpm): 80 Weight (lbs): 338 Respiratory Rate (breaths/min): 16 Body Mass Index (BMI): 47.1 Blood Pressure (mmHg): 143/85 Reference Range: 80 - 120 mg / dl Electronic Signature(s) Signed: 07/26/2019 4:58:25 PM By: Army Melia Entered By: Army Melia on 07/26/2019 08:18:39

## 2019-07-29 ENCOUNTER — Other Ambulatory Visit: Payer: Self-pay

## 2019-07-29 DIAGNOSIS — L97222 Non-pressure chronic ulcer of left calf with fat layer exposed: Secondary | ICD-10-CM | POA: Diagnosis not present

## 2019-07-29 LAB — AEROBIC CULTURE W GRAM STAIN (SUPERFICIAL SPECIMEN)

## 2019-07-29 NOTE — Progress Notes (Signed)
EDREES, VALENT ([id number]) Visit Report for 07/29/2019 Arrival Information Details Patient Name: [name], [name] Date of Service: 07/29/2019 8:15 AM Medical Record Number: [id number] Patient Account Number: [id number] Date of Birth/Sex: 06-Jul-1980 (39 y.o. M) Treating RN: Montey Hora Primary Care Rayanna Matusik: [name] Other Clinician: Referring Hammond Obeirne: [name] Treating Jet Armbrust/Extender: Melburn Hake, [name]: 79 Visit Information History Since Last Visit Added or deleted any medications: No Patient Arrived: Ambulatory Any new allergies or adverse reactions: No Arrival Time: 08:27 Had a fall or experienced change in No Accompanied By: self activities of daily living that may affect Transfer Assistance: None risk of falls: Patient Identification Verified: Yes Signs or symptoms of abuse/neglect since last visito No Secondary Verification Process Completed: Yes Hospitalized since last visit: No Patient Requires Transmission-Based Precautions: No Implantable device outside of the clinic excluding No Patient Has Alerts: No cellular tissue based products placed in the center since last visit: Has Dressing in Place as Prescribed: Yes Has Compression in Place as Prescribed: Yes Pain Present Now: No Notes 98.3 Electronic Signature(s) Signed: 07/29/2019 4:05:00 PM By: Montey Hora Entered By: Montey Hora on 07/29/2019 08:27:50 [name] ([id number]) -------------------------------------------------------------------------------- Compression Therapy Details Patient Name: [name] Date of Service: 07/29/2019 8:15 AM Medical Record Number: [id number] Patient Account Number: [id number] Date of Birth/Sex: Mar 10, 1981 (39 y.o. M) Treating RN: Montey Hora Primary Care Sid Greener: [name] Other Clinician: Referring Owen Pagnotta: [name] Treating Taleeya Blondin/Extender: Melburn Hake, [name]: 138 Compression Therapy Performed for  Wound Assessment: Wound #1 Left,Lateral Lower Leg Performed By: Clinician Montey Hora, RN Compression Type: Three Layer Pre Treatment ABI: 1.2 Electronic Signature(s) Signed: 07/29/2019 4:05:00 PM By: Montey Hora Entered By: Montey Hora on 07/29/2019 08:32:43 [name] ([id number]) -------------------------------------------------------------------------------- Encounter Discharge Information Details Patient Name: [name] Date of Service: 07/29/2019 8:15 AM Medical Record Number: [id number] Patient Account Number: [id number] Date of Birth/Sex: 14-Jun-1980 (39 y.o. M) Treating RN: Montey Hora Primary Care Nyjah Denio: [name] Other Clinician: Referring Vasco Chong: [name] Treating Emiliano Welshans/Extender: Melburn Hake, [name]: 42 Encounter Discharge Information Items Discharge Condition: Stable Ambulatory Status: Ambulatory Discharge Destination: Home Transportation: Private Auto Accompanied By: self Schedule Follow-up Appointment: Yes Clinical Summary of Care: Electronic Signature(s) Signed: 07/29/2019 4:05:00 PM By: Montey Hora Entered By: Montey Hora on 07/29/2019 08:33:09 [name] ([id number]) -------------------------------------------------------------------------------- Wound Assessment Details Patient Name: [name] Date of Service: 07/29/2019 8:15 AM Medical Record Number: [id number] Patient Account Number: [id number] Date of Birth/Sex: Dec 02, 1980 (39 y.o. M) Treating RN: Montey Hora Primary Care Naliah Eddington: [name] Other Clinician: Referring Taquana Bartley: [name] Treating Shakita Keir/Extender: Melburn Hake, [name]: 138 Wound Status Wound Number: 1 Primary Etiology: Pyoderma Wound Location: Left Lower Leg - Lateral Wound Status: Open Wounding Event: Gradually Appeared Comorbid History: Sleep Apnea, Hypertension, Colitis Date Acquired: 11/18/2015 Weeks Of Treatment: 138 Clustered Wound:  No Photos Wound Measurements Length: (cm) 6.1 Width: (cm) 6.2 Depth: (cm) 0.8 Area: (cm) 29.704 Volume: (cm) 23.763 % Reduction in Area: -505.1% % Reduction in Volume: -505.1% Epithelialization: None Tunneling: No Undermining: No Wound Description Classification: Full Thickness With Exposed Support Structur Wound Margin: Epibole Exudate Amount: Large Exudate Type: Serous Exudate Color: amber es Foul Odor After Cleansing: No Slough/Fibrino Yes Wound Bed Granulation Amount: Medium (34-66%) Exposed Structure Granulation Quality: Pink Fascia Exposed: No Necrotic Amount: Medium (34-66%) Fat Layer (Subcutaneous Tissue) Exposed: Yes Necrotic Quality: Adherent Slough Tendon Exposed: No Muscle Exposed: Yes Necrosis of Muscle: No Joint Exposed: No Bone Exposed: No Treatment Notes Wound #1 (Left,  Lateral Lower Leg) Notes TCA, sorbact, xsorb, 3 layer left (unna to anchor) Electronic Signature(s) TOD, ABRAHAMSEN ([id number]) Signed: 07/29/2019 4:05:00 PM By: Montey Hora Entered By: Montey Hora on 07/29/2019 08:32:10

## 2019-08-02 ENCOUNTER — Other Ambulatory Visit: Payer: Self-pay

## 2019-08-02 ENCOUNTER — Encounter: Payer: 59 | Admitting: Physician Assistant

## 2019-08-02 DIAGNOSIS — L97222 Non-pressure chronic ulcer of left calf with fat layer exposed: Secondary | ICD-10-CM | POA: Diagnosis not present

## 2019-08-02 NOTE — Progress Notes (Addendum)
[name], [name] ([id number]) Visit Report for 08/02/2019 Chief Complaint Document Details Patient Name: [name], [name] Date of Service: 08/02/2019 8:15 AM Medical Record Number: [id number] Patient Account Number: [id number] Date of Birth/Sex: 10/03/1980 (39 y.o. 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 M) Treating RN: Primary Care Provider: [name] Other Clinician: Referring Provider: [name] Treating Provider/Extender: Melburn Hake, Taria Castrillo Weeks in Treatment: 138 History of Present Illness HPI Description: 12/04/16; 39 year old man who comes into the clinic today for review of a wound on the posterior left calf. He tells me that is been there for about a year. He is not a diabetic he does smoke half a pack per day. He was seen in the ER on 11/20/16 felt to have cellulitis around the wound and was given clindamycin. An x-ray did not show osteomyelitis. The patient initially tells me that he has a milk allergy that sets off a pruritic itching rash on his lower legs which she scratches incessantly and he thinks that's what may have set up the wound. He has been using various topical antibiotics and ointments without any effect. He works in a [employer] and is on his feet all day. He does not have a prior history of wounds  however he does have the rash on both lower legs the right arm and the ventral aspect of his left arm. These are excoriations and clearly have had scratching however there are of macular looking areas on both legs including a substantial larger area on the right leg. This does not have an underlying open area. There is no blistering. The patient tells me that 2 years ago in [place] in response to the rash on his legs he saw a dermatologist who told him he had a condition which may be pyoderma gangrenosum although I may be putting words into his mouth. He seemed to recognize this. 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 He is not in any treatment for this. He's had no recent travel 12/11/16; the patient arrives today with his wound and roughly the same condition we've been using silver alginate this is a deep punched out wound with some surrounding erythema but no tenderness. Biopsy I did did not show confirmed pyoderma gangrenosum suggested nonspecific inflammation and vasculitis but does not provide an actual description of what was seen by the pathologist. I'm really not able to understand this We have also received information from the patient's dermatologist in [place] notes from April 2016. This was a doctor Agarwal-antal. 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 He was prescribed topical steroid high potency under occlusion which helped but at this point the patient did not have a deep punched out wound. 12/18/16; the patient's wound is larger in terms of surface area however this surface looks better and there is less depth. The surrounding erythema also is better. The patient states that the wrap we put on came off 2 days ago when he has been using his compression stockings. He we  are in the process of getting a dermatology consult. 12/26/16 on evaluation today patient's left lower extremity wound shows evidence of infection with  surrounding erythema noted. He has been tolerating the dressing changes but states that he has noted more discomfort. There is a larger area of erythema surrounding the wound. No fevers, chills, nausea, or vomiting noted at this time. With that being said the wound still does have slough covering the surface. He is not allergic to any medication that he is aware of at this point. In regard to his right lower extremity he had several regions that are erythematous and pruritic he wonders if there's anything we can do to help that. 01/02/17 I reviewed patient's wound culture which was obtained his visit last week. He was placed on doxycycline at that point. Unfortunately that does not appear to be an antibiotic that would likely help with the situation however the pseudomonas noted on culture is sensitive to Cipro. Also unfortunately patient's wound seems to have a large compared to last week's evaluation. Not severely so but there are definitely increased measurements in general. He is continuing to have discomfort as well he writes this to be a seven out of 10. In fact he would prefer me not to perform any debridement today due to the fact that he is having discomfort and considering he has an active infection on the little reluctant to do so anyway. No fevers, chills, nausea, or vomiting noted at this time. 01/08/17; patient seems dermatology on September 5. I suspect dermatology will want the slides from the biopsy I did sent to their pathologist. I'm not sure if there is a way we can expedite that. In any case the culture I did before I left on vacation 3 weeks ago showed Pseudomonas he was given 10 days of Cipro and per her description of her intake nurses is actually somewhat better this week although the wound is quite a bit bigger than I remember the last time I saw this. He still has 3 more days of Cipro 01/21/17; dermatology appointment tomorrow. He has completed the ciprofloxacin for Pseudomonas.  Surface of the wound looks better however he is had some deterioration in the lesions on his right leg. Meantime the left lateral leg wound we will continue with sample 01/29/17; patient had his dermatology appointment but I can't yet see that note. He is completed his antibiotics. The wound is more superficial but considerably larger in circumferential area than when he came in. This is in his left lateral calf. He also has swollen erythematous areas with superficial wounds on the right leg and small papular areas on both arms. There apparently areas in her his upper thighs and buttocks I did not look at those. Dermatology biopsied the right leg. Hopefully will have their input next week. 02/05/17; patient went back to see his dermatologist who told him that he had a "scratching problem" as well as staph. He is now on a 30 day course of doxycycline and I believe she gave him triamcinolone cream to the right leg areas to help with the itching [not exactly sure but probably triamcinolone]. She apparently looked at the left lateral leg wound although this was not rebiopsied and I think felt to be ultimately part of the same pathogenesis. He is using sample border foam and changing nevus himself. He now has a new open area on the right posterior leg which was his biopsy site I don't have any of the dermatology notes 02/12/17; we  put the patient in compression last week with SANTYL to the wound on the left leg and the biopsy. Edema is much better and the depth of the wound is now at level of skin. Area is still the same oBiopsy site on the right lateral leg we've also been using santyl with a border foam dressing and he is changing this himself. 02/19/17; Using silver alginate started last week to both the substantial left leg wound and the biopsy site on the right wound. He is tolerating compression well. Has a an appointment with his primary M.D. tomorrow wondering about diuretics although I'm wondering if  the edema problem is [name], [name] ([id number]) actually lymphedema 02/26/17; the patient has been to see his primary doctor Dr. Jerrel Ivory at [facility] our primary care. She started him on Lasix 20 mg and this seems to have helped with the edema. However we are not making substantial change with the left lateral calf wound and inflammation. The biopsy site on the right leg also looks stable but not really all that different. 03/12/17; the patient has been to see vein and vascular Dr. Lucky Cowboy. He has had venous reflux studies I have not reviewed these. I did get a call from his dermatology office. 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 They therefore recommended clobetasol and tetracycline i.e. 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 03/26/17; apparently the patient just had reflux studies not an appointment with Dr. dew. She arrives in clinic today having applied clobetasol for 2-3 weeks. He notes over the last 2-3 days excessive drainage having to change the dressing 3-4 times a day and also expanding erythema. He states the expanding erythema seems to come and go and was last this red was earlier in the month.he is on doxycycline 150 mg twice a day as an anti- inflammatory systemic therapy for possible pyoderma gangrenosum along with the topical clobetasol 04/02/17; the patient was seen last week by Dr. Lillia Carmel at [facility] dermatology locally who kindly saw him at my request. 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 My concern was the degree of erythema medially extending from his left leg wound which was either inflammation from pyoderma or cellulitis. I  put him on Augmentin however culture of the wound showed Pseudomonas which is quinolone sensitive. I really don't believe he has cellulitis however in view of everything I will continue and give him a course of Cipro. He is also on doxycycline as an immune modulator for the pyoderma. In addition to his original wound on the left lateral leg with surrounding erythema he has a wound on the right posterior calf which was an original biopsy site done by dermatology. 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 Saw Dr. Lillia Carmel yesterday. He has been using topical antibiotics to both wound areas his original wound on the left and the biopsies/pathergy area on the right. There is definitely some improvement in the inflammation around the wound on the right although the patient states he has increasing sensitivity of the wounds. He is on prednisone 60 and doxycycline 1 as prescribed by Dr. Lillia Carmel. He is covering the topical antibiotic with gauze and  putting this in his own compression stocks and changing this daily. He states that Dr. Lottie Rater did a culture of the left leg wound yesterday 05/07/17; pyoderma gangrenosum. The patient saw Dr. Lillia Carmel yesterday and has a follow-up with her in one month. He is still using topical antibiotics to both wounds although he can't recall exactly what type. He is still on prednisone 60 mg. Dr. Lillia Carmel stated that the doxycycline could stop if we were in agreement. 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 The right medial leg was induced by biopsy/pathergy. The area on the right is essentially healed. 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 The right medial leg has closed and remains closed. He is still on  prednisone 60. oHe tells me he missed his last dermatology appointment with Dr. Lillia Carmel but will make another appointment. He reports that her blood sugar at a recent screen in [place] was high 200's. He was 180 today. 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 I think he is going to require still much longer prednisone perhaps another 3 months before attempting to taper. In the meantime his wound is a lot better. Smaller. He is cleaning this off daily and applying topical antibiotics. When he was last in the clinic I thought about changing to [facility] and actually put in a couple of calls to dermatology although probably not during their business hours. 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 He continues on oral prednisone. He has been using triple antibiotic ointment. There is surface debris and we will transition to [employer] and have him return in 2 weeks. He has lost 30 pounds since his last appointment with lifestyle modification. He may benefit from topical steroid cream for treatment this can be considered at a later date. 08/22/17 on evaluation today patient appears to actually be doing rather well in regard to his left lateral lower extremity ulcer. He has actually been managed by Dr. Dellia Nims most recently. Patient is currently on oral steroids at this time. This seems to have been of benefit for him. Nonetheless his last visit was actually with Leah on 08/06/17. Currently he is not utilizing any topical steroid creams although this could be of benefit as well. No fevers, chills, nausea, or vomiting noted at this time. 09/05/17 on evaluation today patient appears to be doing better in regard to his left lateral lower extremity ulcer. He has been tolerating the dressing changes without complication. He is using Santyl with good effect. Overall I'm very pleased  with how things are standing at this point. Patient likewise is happy that this is doing better. 09/19/17 on evaluation today patient actually appears to be doing rather well in regard to his left lateral lower extremity ulcer. 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 He's not having any significant pain. 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 He has been tolerating the Santyl without complication and in general I feel like he's making good progress. 10/17/17 on evaluation today patient appears  to be doing very well in regard to his left lateral lower surety ulcer. He has been tolerating the dressing changes without complication. There does not appear to be any evidence of infection he's alternating the Santyl and the triple antibiotic ointment every other day this seems to be doing well for him. 11/03/17 on evaluation today patient appears to be doing very well in regard to his left lateral lower extremity ulcer. He is been tolerating the dressing changes without complication which is good news. Fortunately there does not appear to be any evidence of infection which is also great news. Overall is doing excellent they are starting to taper down on the prednisone is down to 40 mg at this point it also started topical clobetasol for him. 11/17/17 on evaluation today patient appears to be doing well in regard to his left lateral lower surety ulcer. He's been tolerating the dressing changes without complication. He does note that he is having no pain, no excessive drainage or discharge, and overall he feels like things are going about how he would expect and hope they would. Overall he seems to have no evidence of infection at this time in my opinion which is good news. 12/04/17-He is seen in follow-up evaluation for right  lateral lower extremity ulcer. He has been applying topical steroid cream. Today's measurement show slight increase in size. Over the next 2 weeks we will transition to every other day Santyl and steroid cream. He has been encouraged to monitor for changes and notify clinic with any concerns 12/15/17 on evaluation today patient's left lateral motion the ulcer and fortunately is doing worse again at this point. This just since last week to this [name] ([id number]) week has close to doubled in size according to the patient. I did not seeing last week's I do not have a visual to compare this to in our system was also down so we do not have all the charts and at this point. Nonetheless it does have me somewhat concerned in regard to the fact that again he was worried enough about it he has contact the dermatology that placed them back on the full strength, 50 mg a day of the prednisone that he was taken previous. He continues to alternate using clobetasol along with Santyl at this point. He is obviously somewhat frustrated. 12/22/17 on evaluation today patient appears to be doing a little worse compared to last evaluation. Unfortunately the wound is a little deeper and slightly larger than the last week's evaluation. With that being said he has made some progress in regard to the irritation surrounding at this time unfortunately despite that progress that's been made he still has a significant issue going on here. 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 For that reason I discussed with him today the possibility of perform a wound culture to ensure there's nothing overtly infected. 01/06/18 on evaluation today patient's wound is larger and deeper than previously evaluated. With that being said it did appear that his wound was infected after my last  evaluation with him. Subsequently I did end up prescribing a prescription for Bactrim DS which she has been taking and having no complication with. Fortunately there does not appear to be any evidence of infection at this point in time as far as anything spreading, no want to touch, and overall I feel like things  are showing signs of improvement. 01/13/18 on evaluation today patient appears to be even a little larger and deeper than last time. There still muscle exposed in the base of the wound. Nonetheless he does appear to be less erythematous I do believe inflammation is calming down also believe the infection looks like it's probably resolved at this time based on what I'm seeing. No fevers, chills, nausea, or vomiting noted at this time. 01/30/18 on evaluation today patient actually appears to visually look better for the most part. Unfortunately those visually this looks better he does seem to potentially have what may be an abscess in the muscle that has been noted in the central portion of the wound. This is the first time that I have noted what appears to be fluctuance in the central portion of the muscle. With that being said I'm somewhat more concerned about the fact that this might indicate an abscess formation at this location. I do believe that an ultrasound would be appropriate. This is likely something we need to try to do as soon as possible. He has been switch to mupirocin ointment and he is no longer using the steroid ointment as prescribed by dermatology he sees them again next week he's been decreased from 60 to 40 mg of prednisone. 03/09/18 on evaluation today patient actually appears to be doing a little better compared to last time I saw him. There's not as much erythema surrounding the wound itself. He I did review his most recent infectious disease note which was dated 02/24/18. He saw Dr. Michel Bickers in [place]. 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 Patient is now off of antibiotics and they are continually observing this. There seems to be no change in the past two weeks in my pinion based on what the patient says and what I see today compared to what Dr. Megan Salon likely saw two weeks ago. No fevers, chills, nausea, or vomiting noted at this time. 03/23/18 on evaluation today patient's wound actually appears to be showing signs of improvement which is good news. He is currently still on the Dapsone. He is also working on tapering the prednisone to get off of this and Dr. Lottie Rater is working with him in this regard. Nonetheless overall I feel like the wound is doing well it does appear based on the infectious disease note that I reviewed from Dr. 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 I did also review the results of his ultrasound of left lower extremity which revealed there was a dentist tissue in the base of the wound without an abscess noted. 04/06/18 on evaluation today the patient's left lateral lower extremity ulcer actually appears to be doing fairly well which is excellent news. There does not appear to be any evidence of infection at this time which is also great news. Overall he still does have a significantly large ulceration although little by little he seems to be making progress. He is down to 10 mg a day of the prednisone. 04/20/18 on evaluation today patient actually appears to be doing excellent at this time in regard to his left lower extremity ulcer. He's making signs of good progress unfortunately this is taking much longer than we would really like to see but nonetheless he is making  progress. Fortunately there does not appear to be any evidence of infection at this time. No fevers, chills, nausea, or vomiting noted at this time. The patient  has not been using the Santyl due to the cost he hadn't got in this field yet. He's mainly been using the antibiotic ointment topically. Subsequently he also tells me that he really has not been scrubbing in the shower I think this would be helpful again as I told him it doesn't have to be anything too aggressive to even make it believe just enough to keep it free of some of the loose slough and biofilm on the wound surface. 05/11/18 on evaluation today patient's wound appears to be making slow but sure progress in regard to the left lateral lower extremity ulcer. He is been tolerating the dressing changes without complication. Fortunately there does not appear to be any evidence of infection at this time. He is still just using triple antibiotic ointment along with clobetasol occasionally over the area. He never got the Santyl and really does not seem to intend to in my pinion. 06/01/18 on evaluation today patient appears to be doing a little better in regard to his left lateral lower extremity ulcer. He states that overall he does not feel like he is doing as well with the Dapsone as he did with the prednisone. Nonetheless he sees his dermatologist later today and is gonna talk to them about the possibility of going back on the prednisone. Overall again I believe that the wound would be better if you would utilize Santyl but he really does not seem to be interested in going back to the [place] at this point. He has been using triple antibiotic ointment. 06/15/18 on evaluation today patient's wound actually appears to be doing about the same at this point. Fortunately there is no signs of infection at this time. He has made slight improvements although he continues to not really want to clean the wound bed at this point. He states that he just doesn't mess with it he doesn't want to cause any problems with everything else he has going on. He has been on medication, antibiotics as prescribed by  his dermatologist, for a staff infection of his lower extremities which is really drying out now and looking much better he tells me. Fortunately there is no sign of overall infection. 06/29/18 on evaluation today patient appears to be doing well in regard to his left lateral lower surety ulcer all things considering. Fortunately his staff infection seems to be greatly improved compared to previous. He has no signs of infection and this is drying up quite nicely. He is still the doxycycline for this is no longer on cental, Dapsone, or any of the other medications. His dermatologist has recommended possibility of an infusion but right now he does not want to proceed with that. 07/13/18 on evaluation today patient appears to be doing about the same in regard to his left lateral lower surety ulcer. Fortunately there's no signs of infection at this time which is great news. Unfortunately he still builds up a significant amount of Slough/biofilm of the surface of the wound he still is not Schrom, [name] ([id number]) really cleaning this as he should be appropriately. Again I'm able to easily with saline and gauze remove the majority of this on the surface which if you would do this at home would likely be a dramatic improvement for him as far as getting the area to improve. Nonetheless overall I still feel  like he is making progress is just very slow. I think Santyl will be of benefit for him as well. Still he has not gotten this as of this point. 07/27/18 on evaluation today patient actually appears to be doing little worse in regards of the erythema around the periwound region of the wound he also tells me that he's been having more drainage currently compared to what he was experiencing last time I saw him. He states not quite as bad as what he had because this was infected previously but nonetheless is still appears to be doing poorly. Fortunately there is no evidence of systemic infection at this point.  The patient tells me that he is not going to be able to afford the Santyl. He is still waiting to hear about the infusion therapy with his dermatologist. Apparently she wants an updated colonoscopy first. 08/10/18 on evaluation today patient appears to be doing better in regard to his left lateral lower extremity ulcer. Fortunately he is showing signs of improvement in this regard he's actually been approved for Remicade infusion's as well although this has not been scheduled as of yet. Fortunately there's no signs of active infection at this time in regard to the wound although he is having some issues with infection of the right lower extremity is been seen as dermatologist for this. Fortunately they are definitely still working with him trying to keep things under control. 09/07/18 on evaluation today patient is actually doing rather well in regard to his left lateral lower extremity ulcer. He notes these actually having some hair grow back on his extremity which is something he has not seen in years. He also tells me that the pain is really not giving them any trouble at this time which is also good news overall she is very pleased with the progress he's using a combination of the mupirocin along with the probate is all mixed. 09/21/18 on evaluation today patient actually appears to be doing fairly well all things considered in regard to his looks from the ulcer. He's been tolerating the dressing changes without complication. Fortunately there's no signs of active infection at this time which is good news he is still on all antibiotics or prevention of the staff infection. He has been on prednisone for time although he states it is gonna contact his dermatologist and see if she put them on a short course due to some irritation that he has going on currently. Fortunately there's no evidence of any overall worsening this is going very slow I think cental would be something that would be helpful for him  although he states that $50 for tube is quite expensive. He therefore is not willing to get that at this point. 10/06/18 on evaluation today patient actually appears to be doing decently well in regard to his left lateral leg ulcer. He's been tolerating the dressing changes without complication. Fortunately there's no signs of active infection at this time. Overall I'm actually rather pleased with the progress he's making although it's slow he doesn't show any signs of infection and he does seem to be making some improvement. I do believe that he may need a switch up and dressings to try to help this to heal more appropriately and quickly. 10/19/18 on evaluation today patient actually appears to be doing better in regard to his left lateral lower extremity ulcer. This is shown signs of having much less Slough buildup at this point due to the fact he has been using the  Santyl. Obviously this is very good news. The overall size of the wound is not dramatically smaller but again the appearance is. 11/02/18 on evaluation today patient actually appears to be doing quite well in regard to his lower [place] ulcer. A lot of the skin around the ulcer is actually somewhat irritating at this point this seems to be more due to the dressing causing irritation from the adhesive that anything else. Fortunately there is no signs of active infection at this time. 11/24/18 on evaluation today patient appears to be doing a little worse in regard to his overall appearance of his lower extremity ulcer. There's more erythema and warmth around the wound unfortunately. He is currently on doxycycline which he has been on for some time. With that being said I'm not sure that seems to be helping with what appears to possibly be an acute cellulitis with regard to his left lower extremity ulcer. No fevers, chills, nausea, or vomiting noted at this time. 12/08/18 on evaluation today patient's wounds actually appears to be doing  significantly better compared to his last evaluation. He has been using Santyl along with alternating tripling about appointment as well as the steroid cream seems to be doing quite well and the wound is showing signs of improvement which is excellent news. Fortunately there's no evidence of infection and in fact his culture came back negative with only normal skin flora noted. 12/21/2018 upon evaluation today patient actually appears to be doing excellent with regard to his ulcer. This is actually the best that I have seen it since have been helping to take care of him. It is both smaller as well as less slough noted on the surface of the wound and seems to be showing signs of good improvement with new skin growing from the edges. He has been using just the triamcinolone he does wonder if he can get a refill of that ointment today. 01/04/2019 upon evaluation today patient actually appears to be doing well with regard to his left lateral lower extremity ulcer. With that being said it does not appear to be that he is doing quite as well as last time as far as progression is concerned. There does not appear to be any signs of infection or significant irritation which is good news. With that being said I do believe that he may benefit from switching to a collagen based dressing based on how clean The wound appears. 01/18/2019 on evaluation today patient actually appears to be doing well with regard to his wound on the left lower extremity. He is not made a lot of progress compared to where we were previous but nonetheless does seem to be doing okay at this time which is good news. There is no signs of active infection which is also good news. My only concern currently is I do wish we can get him into utilizing the collagen dressing his insurance would not pay for the supplies that we ordered although it appears that he may be able to order this through his supply company that he typically utilizes. This is  [employer]. Nonetheless he did try to order it during the office visit today and it appears this did go through. We will see if he can get that it is a different brand but nonetheless he has collagen and I do think will be beneficial. 02/01/2019 on evaluation today patient actually appears to be doing a little worse today in regard to the overall size of his wounds. Fortunately there is  no signs of active infection at this time. That is visually. Nonetheless when this is happened before it was due to infection. For that reason were somewhat concerned about that this time as well. 02/08/2019 on evaluation today patient unfortunately appears to be doing slightly worse with regard to his wound upon evaluation today. Is measuring a [name], [name] ([id number]) little deeper and a little larger unfortunately. I am not really sure exactly what is causing this to enlarge he actually did see his dermatologist she is going to see about initiating Humira for him. Subsequently she also did do steroid injections into the wound itself in the periphery. Nonetheless still nonetheless he seems to be getting a little bit larger he is gone back to just using the steroid cream topically which I think is appropriate. I would say hold off on the collagen for the time being is definitely a good thing to do. Based on the culture results which we finally did get the final result back regarding it shows staph as the bacteria noted again that can be a normal skin bacteria based on the fact however he is having increased drainage and worsening of the wound measurement wise I would go ahead and place him on an antibiotic today I do believe for this. 02/15/2019 on evaluation today patient actually appears to be doing somewhat better in regard to his ulcer. There is no signs of worsening at this time I did review his culture results which showed evidence of Staphylococcus aureus but not MRSA. Again this could just be more related to the  normal skin bacteria although he states the drainage has slowed down quite a bit he may have had a mild infection not just colonization. And was much smaller and then since around10/04/2019 on evaluation today patient appears to be doing unfortunately worse as far as the size of the wound. I really feel like that this is steadily getting larger again it had been doing excellent right at the beginning of September we have seen a steady increase in the area of the wound it is almost 2-1/2 times the size it was on September 1. Obviously this is a bad trend this is not wanting to see. For that reason we went back to using just the topical triamcinolone cream which does seem to help with inflammation. I checked him for bacteria by way of culture and nothing showed positive there. I am considering giving him a short course of a tapering steroid Dosepak today to see if that is can be beneficial for him. The patient is in agreement with giving that a try. 03/08/2019 on evaluation today patient appears to be doing very well in comparison to last evaluation with regard to his lower extremity ulcer. This is showing signs of less inflammation and actually measuring slightly smaller compared to last time every other week over the past month and a half he has been measuring larger larger larger. Nonetheless I do believe that the issue has been inflammation the prednisone does seem to have been beneficial for him which is good news. No fevers, chills, nausea, vomiting, or diarrhea. 03/22/2019 on evaluation today patient appears to be doing about the same with regard to his leg ulcer. He has been tolerating the dressing changes without complication. With that being said the wound seems to be mostly arrested at its current size but really is not making any progress except for when we prescribed the prednisone. He did show some signs of dropping as far as the  overall size of the wound during that interval week.  Nonetheless this is something he is not on long-term at this point and unfortunately I think he is getting need either this or else the Humira which his dermatologist has discussed try to get approval for. With that being said he will be seeing his dermatologist on the 11th of this month that is November. 04/19/2019 on evaluation today patient appears to be doing really about the same the wound is measuring slightly larger compared to last time I saw him. He has not been into the office since November 2 due to the fact that he unfortunately had Covid as that his entire family. He tells me that it was rough but they did pull-through and he seems to be doing much better. Fortunately there is no signs of active infection at this time. No fevers, chills, nausea, vomiting, or diarrhea. 05/10/2019 on evaluation today patient unfortunately appears to be doing significantly worse as compared to last time I saw him. He does tell me that he has had his first dose of Humira and actually is scheduled to get the next one in the upcoming week. With that being said he tells me also that in the past several days he has been having a lot of issues with green drainage she showed me a picture this is more blue-green in color. He is also been having issues with increased sloughy buildup and the wound does appear to be larger today. Obviously this is not the direction that we want everything to take based on the starting of his Humira. Nonetheless I think this is definitely a result of likely infection and to be honest I think this is probably Pseudomonas causing the infection based on what I am seeing. 05/24/2019 on evaluation today patient unfortunately appears to be doing significantly worse compared to his prior evaluation with me 2 weeks ago. I did review his culture results which showed that he does have Staph aureus as well as Pseudomonas noted on the culture. Nonetheless the Levaquin that I prescribed for him does  not appear to have been appropriate and in fact he tells me he is no longer experiencing the green drainage and discharge that he had at the last visit. Fortunately there is no signs of active infection at this time which is good news although the wound has significantly worsened it in fact is much deeper than it was previous. We have been utilizing up to this point triamcinolone ointment as the prescription topical of choice but at this time I really feel like that the wound is getting need to be packed in order to appropriately manage this due to the deeper nature of the wound. Therefore something along the lines of an alginate dressing may be more appropriate. 05/31/2019 upon inspection today patient's wound actually showed signs of doing poorly at this point. Unfortunately he just does not seem to be making any good progress despite what we have tried. He actually did go ahead and pick up the Cipro and start taking that as he was noticing more green drainage he had previously completed the Levaquin that I prescribed for him as well. Nonetheless he missed his appointment for the seventh last week on Wednesday with the wound care center and [facility] where his dermatologist referred him. Obviously I do think a second opinion would be helpful at this point especially in light of the fact that the patient seems to be doing so poorly despite the fact that  we have tried everything that I really know how at this point. The only thing that ever seems to have helped him in the past is when he was on high doses of continual steroids that did seem to make a difference for him. 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 06/07/2019 upon evaluation today patient unfortunately appears to be doing worse yet again with regard to his wound. In fact I am  starting to question whether or not he may have a fluid pocket in the muscle at this point based on the bulging and the soft appearance to the central portion of the muscle area. There is not anything draining from the muscle itself at this time which is good news but nonetheless the wound is expanding. I am not really seeing any results of the Humira as far as overall wound progression based on what I am seeing at this point. The patient has been referred for second opinion with regard to his wound to the [facility] wound care center by his dermatologist which I definitely am not in opposition to. Unfortunately we tried multiple dressings in the past including collagen, alginate, and at one point even Hydrofera Blue. With that being said he is never really used it for any significant amount of time due to the fact that he often complains of pain associated with these dressings and then will go back to either using the Santyl which she has done intermittently or more frequently the triamcinolone. He is also using his own compression stockings. We have wrapped him in the past but again that was something else that he really was not a big fan of. 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 He seemed to do much better when he was on the steroids although honestly I understand that the steroids are not necessarily the best medication to be on long-term obviously Skidmore, Elye ([id number]) 06/14/2019 on evaluation today patient appears to be doing actually a little bit better with regard to the overall appearance with his leg. Unfortunately he does continue to have issues with what appears to be  some fluid underneath the muscle although he did see the wound specialty center at [facility] last week their main goals were to see about infusion therapy in place of the Humira as they feel like that is not quite strong enough. They also recommended that we continue with the treatment otherwise as we are they felt like that was appropriate and they are okay with him continuing to follow-up here with [place] in that regard. With that being said they are also sending him to the vein specialist there to see about vein stripping and if that would be of benefit for him. Subsequently they also did not really address whether or not an ultrasound of the muscle area to see if there is anything that needs to be addressed here would be appropriate or not. For that reason I discussed this with him last week I  think we may proceed down that road at this point. 06/21/2019 upon evaluation today patient's wound actually appears to be doing slightly better compared to previous evaluations. I do believe that he has made a difference with regard to the progression here with the use of oral steroids. Again in the past has been the only thing that is really calm things down. He does tell me that from [facility] is gotten a good news from there that there are no further vein stripping that is necessary at this point. I do not have that available for review today although the patient did relay this to me. He also did obtain and have the ultrasound of the wound completed which I did sign off on today. It does appear that there is no fluid collection under the muscle this is likely then just edematous tissue in general. That is also good news. Overall I still believe the inflammation is the main issue here. He did inquire about the possibility of a wound VAC again with the muscle protruding like it is I am not really sure whether the wound VAC is necessarily ideal or not. That is something we will have to consider although I do believe he may  need compression wrapping to try to help with edema control which could potentially be of benefit. 06/28/2019 on evaluation today patient appears to be doing slightly better measurement wise although this is not terribly smaller he least seems to be trending towards that direction. With that being said he still seems to have purulent drainage noted in the wound bed at this time. He has been on Levaquin followed by Cipro over the past month. Unfortunately he still seems to have some issues with active infection at this time. I did perform a culture last week in order to evaluate and see if indeed there was still anything going on. Subsequently the culture did come back showing Pseudomonas which is consistent with the drainage has been having which is blue-green in color. He also has had an odor that again was somewhat consistent with Pseudomonas as well. Long story short it appears that the culture showed an intermediate finding with regard to how well the Cipro will work for the Pseudomonas infection. Subsequently being that he does not seem to be clearing up and at best what we are doing is just keeping this at [facility] I think he may need to see infectious disease to discuss IV antibiotic options. 07/05/2019 upon evaluation today patient appears to be doing okay in regard to his leg ulcer. He has been tolerating the dressing changes at this point without complication. Fortunately there is no signs of active infection at this time which is good news. No fevers, chills, nausea, vomiting, or diarrhea. With that being said he does have an appointment with infectious disease tomorrow and his primary care on Wednesday. Again the reason for the infectious disease referral was due to the fact that he did not seem to be fully resolving with the use of oral antibiotics and therefore we were thinking that IV antibiotic therapy may be necessary secondary to the fact that there was an intermediate finding for how  effective the Cipro may be. Nonetheless again he has been having a lot of purulent and even green drainage. Fortunately right now that seems to have calmed down over the past week with the reinitiation of the oral antibiotic. Nonetheless we will see what Dr. Megan Salon has to say. 07/12/2019 upon evaluation today patient appears to be doing  about the same at this point in regard to his left lower extremity ulcer. Fortunately there is no signs of active infection at this time which is good news I do believe the Levaquin has been beneficial I did review Dr. Hale Bogus note and to be honest I agree that the patient's leg does appear to be doing better currently. What we found in the past as he does not seem to really completely resolve he will stop the antibiotic and then subsequently things will revert back to having issues with blue-green drainage, increased pain, and overall worsening in general. Obviously that is the reason I sent him back to infectious disease. 07/19/2019 upon evaluation today patient appears to be doing roughly the same in size there is really no dramatic improvement. He has started back on the Levaquin at this point and though he seems to be doing okay he did still have a lot of blue/green drainage noted on evaluation today unfortunately. I think that this is still indicative more likely of a Pseudomonas infection as previously noted and again he does see Dr. Megan Salon in just a couple of days. I do not know that were really able to effectively clear this with just oral antibiotics alone based on what I am seeing currently. Nonetheless we are still continue to try to manage as best we can with regard to the patient and his wound. I do think the wrap was helpful in decreasing the edema which is excellent news. No fevers, chills, nausea, vomiting, or diarrhea. 07/26/2019 upon evaluation today patient appears to be doing slightly better with regard to the overall appearance of the muscle  there is no dark discoloration centrally. Fortunately there is no signs of active infection at this time. No fevers, chills, nausea, vomiting, or diarrhea. Patient's wound bed currently the patient did have an appointment with Dr. Megan Salon at infectious disease last week. With that being said Dr. Megan Salon the patient states was still somewhat hesitant about put him on any IV antibiotics he wanted [place] to repeat cultures today and then see where things go going forward. He does look like Dr. Megan Salon because of some improvement the patient did have with the Levaquin wanted [place] to see about repeating cultures. If it indeed grows the Pseudomonas again then he recommended a possibility of considering a PICC line placement and IV antibiotic therapy. He plans to see the patient back in 1 to 2 weeks. 08/02/2019 upon evaluation today patient appears to be doing poorly with regard to his left lower extremity. We did get the results of his culture back it shows that he is still showing evidence of Pseudomonas which is consistent with the purulent/blue-green drainage that he has currently. Subsequently the culture also shows that he now is showing resistance to the oral fluoroquinolones which is unfortunate as that was really the only thing to treat the infection prior. I do believe that he is looking like this is going require IV antibiotic therapy to get this under control. Fortunately there is no signs of systemic infection at this time which is good news. The patient does see Dr. Megan Salon tomorrow. Electronic Signature(s) Signed: 08/02/2019 9:01:56 AM By: Worthy Keeler PA-C Entered By: Worthy Keeler on 08/02/2019 09:01:56 DEJAY, KRONK ([id number]) -------------------------------------------------------------------------------- Physical Exam Details Patient Name: [name] Date of Service: 08/02/2019 8:15 AM Medical Record Number: [id number] Patient Account Number: [id number] Date of Birth/Sex:  Jan 29, 1981 (39 y.o. M) Treating RN: Primary Care Provider: [name] Other Clinician: Referring Provider: [name]  Treating Provider/Extender: STONE III, Meztli Llanas Weeks in Treatment: 138 Constitutional Well-nourished and well-hydrated in no acute distress. Respiratory normal breathing without difficulty. Psychiatric this patient is able to make decisions and demonstrates good insight into disease process. Alert and Oriented x 3. pleasant and cooperative. Notes Upon inspection today patient showed signs of poor granulation tissue in fact he had a lot of purulent drainage and the muscle exposed also appears to show some signs of necrosis. Overall I am not really pleased with what I am seeing at this time. Fortunately there is no evidence of active infection which is good news systemically although I think locally he does still show signs of fairly significant infection the wound is also measuring larger today. Electronic Signature(s) Signed: 08/02/2019 9:02:37 AM By: Worthy Keeler PA-C Entered By: Worthy Keeler on 08/02/2019 09:02:36 AMADOU, [name] ([id number]) -------------------------------------------------------------------------------- Physician Orders Details Patient Name: [name] Date of Service: 08/02/2019 8:15 AM Medical Record Number: [id number] Patient Account Number: [id number] Date of Birth/Sex: [birth year] (39 y.o. 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 Anesthetic (add  to Medication List) Wound #1 Left,Lateral Lower Leg o Topical Lidocaine 4% cream applied to wound bed prior to debridement (In Clinic Only). Primary Wound Dressing o Alginate Secondary Dressing Wound #1 Left,Lateral Lower Leg o ABD and Kerlix/Conform Dressing Change Frequency Wound #1 Left,Lateral Lower Leg o Change dressing every day. Follow-up Appointments Wound #1 Left,Lateral Lower Leg o Return Appointment in 1 week. Edema Control Wound #1 Left,Lateral Lower Leg o Elevate legs to the level of the heart and pump ankles as often as possible - Tubi grip F Electronic Signature(s) Signed: 08/02/2019 1:35:00 PM By: Worthy Keeler PA-C Signed: 08/03/2019 10:13:19 AM By: Army Melia Entered By: Army Melia on 08/02/2019 08:58:13 [name] ([id number]) -------------------------------------------------------------------------------- Problem List Details Patient Name: [name] Date of Service: 08/02/2019 8:15 AM Medical Record Number: [id number] Patient Account Number: [id number] Date of Birth/Sex: 01/28/1981 (39 y.o. M) Treating RN: Primary Care Provider: [name] Other Clinician: Referring Provider: [name] Treating Provider/Extender: Melburn Hake, Lubertha Leite Weeks in Treatment: 138 Active Problems ICD-10 Evaluated Encounter Code Description Active Date Today Diagnosis L97.222 Non-pressure chronic ulcer of left calf with fat layer exposed 12/04/2016 No Yes L88 Pyoderma gangrenosum 03/26/2017 No Yes I87.2 Venous insufficiency (chronic) (peripheral) 12/04/2016 No Yes L03.116 Cellulitis of left lower limb 05/24/2019 No Yes Inactive Problems ICD-10 Code Description Active Date Inactive Date L97.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 M) Treating RN: Primary Care Provider: [name] Other Clinician: Referring Provider: [name] Treating Provider/Extender: Melburn Hake, Haile Toppins Weeks in Treatment: 138 Subjective Chief Complaint Information obtained from Patient He is here in follow up evaluation for LLE pyoderma ulcer History of Present Illness (HPI) 12/04/16; 39 year old man who comes into the clinic today  for review of a wound on the posterior left calf. He tells me that is been there for about a year. He is not a diabetic he does smoke half a pack per day. He was seen in the ER on 11/20/16 felt to have cellulitis around the wound and was given clindamycin. An x-ray did not show osteomyelitis. The patient initially tells me that he has a milk allergy that sets off a pruritic itching rash on his lower legs which she scratches incessantly and he thinks that's what may have set up the wound. He has been using various topical antibiotics and ointments without any effect. He works in a [employer] and is on his feet all day. He does not have a prior history of wounds however he does have the rash on both lower legs the right arm and the ventral aspect of his left arm. These are excoriations and clearly have had scratching however there are of macular looking areas on both legs including a substantial larger area on the right leg. This does not have an underlying open area. There is no blistering. The patient tells me that 2 years ago in [place] in response to the rash on his legs he saw a dermatologist who told him he had a condition which may be pyoderma gangrenosum although I may be putting words into his mouth. He seemed to recognize this.  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 He is not in any treatment for this. He's had no recent travel 12/11/16; the patient arrives today with his wound and roughly the same condition we've been using silver alginate this is a deep punched out wound with some surrounding erythema but no tenderness. Biopsy I did did not show confirmed pyoderma gangrenosum suggested nonspecific inflammation and vasculitis but does not provide an actual description of what was seen by the pathologist. I'm really not able to understand this We have also received information from the patient's dermatologist in [place] notes from April 2016. This was a doctor Agarwal-antal. 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 He was prescribed topical steroid high potency under occlusion which helped but at this point the patient did not have a deep punched out wound. 12/18/16; the patient's wound is larger in terms of surface area however this surface looks better and there is less depth. The surrounding erythema also is better. The patient states that the wrap we put on came off 2 days ago when he has been using his compression stockings. He we are in the process of getting a dermatology consult. 12/26/16 on evaluation today patient's left lower extremity wound shows evidence of infection with surrounding erythema noted. He has been tolerating the dressing changes but states that he has noted more discomfort. There is a larger area of erythema surrounding the wound. No fevers, chills, nausea, or vomiting noted at this time. With that being said the wound still does have slough covering the surface. He is not allergic to any medication that he is aware of at this point. In regard to his right lower extremity he had several regions that are erythematous and pruritic he wonders if there's anything we can do to help that. 01/02/17 I reviewed patient's wound culture which was  obtained his visit last week. He was placed on doxycycline at that point. Unfortunately that does not appear to be an antibiotic that would likely help with the situation however the pseudomonas noted on culture is sensitive  to Cipro. Also unfortunately patient's wound seems to have a large compared to last week's evaluation. Not severely so but there are definitely increased measurements in general. He is continuing to have discomfort as well he writes this to be a seven out of 10. In fact he would prefer me not to perform any debridement today due to the fact that he is having discomfort and considering he has an active infection on the little reluctant to do so anyway. No fevers, chills, nausea, or vomiting noted at this time. 01/08/17; patient seems dermatology on September 5. I suspect dermatology will want the slides from the biopsy I did sent to their pathologist. I'm not sure if there is a way we can expedite that. In any case the culture I did before I left on vacation 3 weeks ago showed Pseudomonas he was given 10 days of Cipro and per her description of her intake nurses is actually somewhat better this week although the wound is quite a bit bigger than I remember the last time I saw this. He still has 3 more days of Cipro 01/21/17; dermatology appointment tomorrow. He has completed the ciprofloxacin for Pseudomonas. Surface of the wound looks better however he is had some deterioration in the lesions on his right leg. Meantime the left lateral leg wound we will continue with sample 01/29/17; patient had his dermatology appointment but I can't yet see that note. He is completed his antibiotics. The wound is more superficial but considerably larger in circumferential area than when he came in. This is in his left lateral calf. He also has swollen erythematous areas with superficial wounds on the right leg and small papular areas on both arms. There apparently areas in her his upper thighs and  buttocks I did not look at those. Dermatology biopsied the right leg. Hopefully will have their input next week. 02/05/17; patient went back to see his dermatologist who told him that he had a "scratching problem" as well as staph. He is now on a 30 day course of doxycycline and I believe she gave him triamcinolone cream to the right leg areas to help with the itching [not exactly sure but probably triamcinolone]. She apparently looked at the left lateral leg wound although this was not rebiopsied and I think felt to be ultimately part of the same pathogenesis. He is using sample border foam and changing nevus himself. He now has a new open area on the right posterior leg which was his biopsy site I don't have any of the dermatology notes [name], [name] ([id number]) 02/12/17; we put the patient in compression last week with SANTYL to the wound on the left leg and the biopsy. Edema is much better and the depth of the wound is now at level of skin. Area is still the same Biopsy site on the right lateral leg we've also been using santyl with a border foam dressing and he is changing this himself. 02/19/17; Using silver alginate started last week to both the substantial left leg wound and the biopsy site on the right wound. He is tolerating compression well. Has a an appointment with his primary M.D. tomorrow wondering about diuretics although I'm wondering if the edema problem is actually lymphedema 02/26/17; the patient has been to see his primary doctor Dr. Jerrel Ivory at [facility] our primary care. She started him on Lasix 20 mg and this seems to have helped with the edema. However we are not making substantial change with the left lateral calf wound  and inflammation. The biopsy site on the right leg also looks stable but not really all that different. 03/12/17; the patient has been to see vein and vascular Dr. Lucky Cowboy. He has had venous reflux studies I have not reviewed these. I did get a call from  his dermatology office. 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 They therefore recommended clobetasol and tetracycline i.e. 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 03/26/17; apparently the patient just had reflux studies not an appointment with Dr. dew. She arrives in clinic today having applied clobetasol for 2-3 weeks. He notes over the last 2-3 days excessive drainage having to change the dressing 3-4 times a day and also expanding erythema. He states the expanding erythema seems to come and go and was last this red was earlier in the month.he is on doxycycline 150 mg twice a day as an anti- inflammatory systemic therapy for possible pyoderma gangrenosum along with the topical clobetasol 04/02/17; the patient was seen last week by Dr. Lillia Carmel at [facility] dermatology locally who kindly saw him at my request. 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 My concern was the degree of erythema medially extending from his left leg wound which was either inflammation from pyoderma or cellulitis. I put him on Augmentin however culture of the wound showed Pseudomonas which is quinolone sensitive. I really don't believe he has cellulitis however in view of everything I will continue and give him a course of Cipro. He is also on doxycycline as an immune modulator for the pyoderma. In addition to his original wound on the left lateral leg with surrounding erythema he has a wound on the right posterior calf which was an original biopsy site done by dermatology. 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 Saw Dr.  Lillia Carmel yesterday. He has been using topical antibiotics to both wound areas his original wound on the left and the biopsies/pathergy area on the right. There is definitely some improvement in the inflammation around the wound on the right although the patient states he has increasing sensitivity of the wounds. He is on prednisone 60 and doxycycline 1 as prescribed by Dr. Lillia Carmel. He is covering the topical antibiotic with gauze and putting this in his own compression stocks and changing this daily. He states that Dr. Lottie Rater did a culture of the left leg wound yesterday 05/07/17; pyoderma gangrenosum. The patient saw Dr. Lillia Carmel yesterday and has a follow-up with her in one month. He is still using topical antibiotics to both wounds although he can't recall exactly what type. He is still on prednisone 60 mg. Dr. Lillia Carmel stated that the doxycycline could stop if we were in agreement. 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 The right medial leg was induced by biopsy/pathergy. The area on the right is essentially healed. 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 The right medial leg has closed and remains closed. He is still on prednisone 60. He tells me he missed his last dermatology  appointment with Dr. Lillia Carmel but will make another appointment. He reports that her blood sugar at a recent screen in [place] was high 200's. He was 180 today. 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 I think he is going to require still much longer prednisone perhaps another 3 months before attempting to taper. In the meantime his wound is a lot better. Smaller. He is cleaning this off daily and applying topical antibiotics. When he was last in the clinic I thought about changing to [facility] and  actually put in a couple of calls to dermatology although probably not during their business hours. 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 He continues on oral prednisone. He has been using triple antibiotic ointment. There is surface debris and we will transition to Lif[employer] and have him return in 2 weeks. He has lost 30 pounds since his last appointment with lifestyle modification. He may benefit from topical steroid cream for treatment this can be considered at a later date. 08/22/17 on evaluation today patient appears to actually be doing rather well in regard to his left lateral lower extremity ulcer. He has actually been managed by Dr. Dellia Nims most recently. Patient is currently on oral steroids at this time. This seems to have been of benefit for him. Nonetheless his last visit was actually with Leah on 08/06/17. Currently he is not utilizing any topical steroid creams although this could be of benefit as well. No fevers, chills, nausea, or vomiting noted at this time. 09/05/17 on evaluation today patient appears to be doing better in regard to his left lateral lower extremity ulcer. He has been tolerating the dressing changes without complication. He is using Santyl with good effect. Overall I'm very pleased with how things are standing at this point. Patient likewise is happy that this is doing better. 09/19/17 on evaluation today patient actually appears to be doing rather well in regard to his left lateral lower extremity ulcer. 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 He's not having any significant pain. 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 He has been tolerating the Santyl  without complication and in general I feel like he's making good progress. 10/17/17 on evaluation today patient appears to be doing very well in regard to his left lateral lower surety ulcer. He has been tolerating the dressing changes without complication. There does not appear to be any evidence of infection he's alternating the Santyl and the triple antibiotic ointment every other day this seems to be doing well for him. 11/03/17 on evaluation today patient appears to be doing very well in regard to his left lateral lower extremity ulcer. He is been tolerating the dressing changes without complication which is good news. Fortunately there does not appear to be any evidence of infection which is also great news. Overall is doing excellent they are starting to taper down on the prednisone is down to 40 mg at this point it also started topical clobetasol for him. 11/17/17 on evaluation today patient appears to be doing well in regard to his left lateral lower surety ulcer. He's been tolerating the dressing changes without complication. He does note that he  is having no pain, no excessive drainage or discharge, and overall he feels like things are going about how he would expect and hope they would. Overall he seems to have no evidence of infection at this time in my opinion which is good news. [name], [name] ([id number]) 12/04/17-He is seen in follow-up evaluation for right lateral lower extremity ulcer. He has been applying topical steroid cream. Today's measurement show slight increase in size. Over the next 2 weeks we will transition to every other day Santyl and steroid cream. He has been encouraged to monitor for changes and notify clinic with any concerns 12/15/17 on evaluation today patient's left lateral motion the ulcer and fortunately is doing worse again at this point. This just since last week to this week has close to doubled in size according to the patient. I did not seeing last week's I do not  have a visual to compare this to in our system was also down so we do not have all the charts and at this point. Nonetheless it does have me somewhat concerned in regard to the fact that again he was worried enough about it he has contact the dermatology that placed them back on the full strength, 50 mg a day of the prednisone that he was taken previous. He continues to alternate using clobetasol along with Santyl at this point. He is obviously somewhat frustrated. 12/22/17 on evaluation today patient appears to be doing a little worse compared to last evaluation. Unfortunately the wound is a little deeper and slightly larger than the last week's evaluation. With that being said he has made some progress in regard to the irritation surrounding at this time unfortunately despite that progress that's been made he still has a significant issue going on here. 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 For that reason I discussed with him today the possibility of perform a wound culture to ensure there's nothing overtly infected. 01/06/18 on evaluation today patient's wound is larger and deeper than previously evaluated. With that being said it did appear that his wound was infected after my last evaluation with him. Subsequently I did end up prescribing a prescription for Bactrim DS which she has been taking and having no complication with. Fortunately there does not appear to be any evidence of infection at this point in time as far as anything spreading, no want to touch, and overall I feel like things are showing signs of improvement. 01/13/18 on evaluation today patient appears to be even a little larger and deeper than last time. There still muscle exposed in the base of the wound. Nonetheless he does appear to be less erythematous I do believe  inflammation is calming down also believe the infection looks like it's probably resolved at this time based on what I'm seeing. No fevers, chills, nausea, or vomiting noted at this time. 01/30/18 on evaluation today patient actually appears to visually look better for the most part. Unfortunately those visually this looks better he does seem to potentially have what may be an abscess in the muscle that has been noted in the central portion of the wound. This is the first time that I have noted what appears to be fluctuance in the central portion of the muscle. With that being said I'm somewhat more concerned about the fact that this might indicate an abscess formation at this location.  I do believe that an ultrasound would be appropriate. This is likely something we need to try to do as soon as possible. He has been switch to mupirocin ointment and he is no longer using the steroid ointment as prescribed by dermatology he sees them again next week he's been decreased from 60 to 40 mg of prednisone. 03/09/18 on evaluation today patient actually appears to be doing a little better compared to last time I saw him. There's not as much erythema surrounding the wound itself. He I did review his most recent infectious disease note which was dated 02/24/18. He saw Dr. Michel Bickers in [place]. 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 Patient is now off of antibiotics and they are continually observing this. There seems to be no change in the past two weeks in my pinion based on what the patient says and what I see today compared to what Dr. Megan Salon likely saw two weeks ago. No fevers, chills, nausea, or vomiting noted at this time. 03/23/18 on evaluation today patient's wound actually appears to be showing signs of improvement which is good news. He is currently still on the Dapsone. He is also working on tapering the  prednisone to get off of this and Dr. Lottie Rater is working with him in this regard. Nonetheless overall I feel like the wound is doing well it does appear based on the infectious disease note that I reviewed from Dr. 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 I did also review the results of his ultrasound of left lower extremity which revealed there was a dentist tissue in the base of the wound without an abscess noted. 04/06/18 on evaluation today the patient's left lateral lower extremity ulcer actually appears to be doing fairly well which is excellent news. There does not appear to be any evidence of infection at this time which is also great news. Overall he still does have a significantly large ulceration although little by little he seems to be making progress. He is down to 10 mg a day of the prednisone. 04/20/18 on evaluation today patient actually appears to be doing excellent at this time in regard to his left lower extremity ulcer. He's making signs of good progress unfortunately this is taking much longer than we would really like to see but nonetheless he is making progress. Fortunately there does not appear to be any evidence of infection at this time. No fevers, chills, nausea, or vomiting noted at this time. The patient has not been using the Santyl due to the cost he hadn't got in this field yet. He's mainly been using the antibiotic ointment topically. Subsequently he also tells me that he really has not been scrubbing in the shower I think this would be helpful again as I told him it doesn't have to be anything too aggressive to even make it believe just enough to keep it free of some of the loose slough and biofilm on the wound surface. 05/11/18 on evaluation today patient's wound appears to be making slow but sure progress in regard to the left lateral lower extremity  ulcer. He is been tolerating the dressing changes without complication. Fortunately there does not appear to be any evidence of infection at this time. He is still just using triple  antibiotic ointment along with clobetasol occasionally over the area. He never got the Santyl and really does not seem to intend to in my pinion. 06/01/18 on evaluation today patient appears to be doing a little better in regard to his left lateral lower extremity ulcer. He states that overall he does not feel like he is doing as well with the Dapsone as he did with the prednisone. Nonetheless he sees his dermatologist later today and is gonna talk to them about the possibility of going back on the prednisone. Overall again I believe that the wound would be better if you would utilize Santyl but he really does not seem to be interested in going back to the [place] at this point. He has been using triple antibiotic ointment. 06/15/18 on evaluation today patient's wound actually appears to be doing about the same at this point. Fortunately there is no signs of infection at this time. He has made slight improvements although he continues to not really want to clean the wound bed at this point. He states that he just doesn't mess with it he doesn't want to cause any problems with everything else he has going on. He has been on medication, antibiotics as prescribed by his dermatologist, for a staff infection of his lower extremities which is really drying out now and looking much better he tells me. Fortunately there is no sign of overall infection. 06/29/18 on evaluation today patient appears to be doing well in regard to his left lateral lower surety ulcer all things considering. Fortunately his staff infection seems to be greatly improved compared to previous. He has no signs of infection and this is drying up quite nicely. He is still the doxycycline [name], [name] ([id number]) for this is no longer on cental, Dapsone, or any of  the other medications. His dermatologist has recommended possibility of an infusion but right now he does not want to proceed with that. 07/13/18 on evaluation today patient appears to be doing about the same in regard to his left lateral lower surety ulcer. Fortunately there's no signs of infection at this time which is great news. Unfortunately he still builds up a significant amount of Slough/biofilm of the surface of the wound he still is not really cleaning this as he should be appropriately. Again I'm able to easily with saline and gauze remove the majority of this on the surface which if you would do this at home would likely be a dramatic improvement for him as far as getting the area to improve. Nonetheless overall I still feel like he is making progress is just very slow. I think Santyl will be of benefit for him as well. Still he has not gotten this as of this point. 07/27/18 on evaluation today patient actually appears to be doing little worse in regards of the erythema around the periwound region of the wound he also tells me that he's been having more drainage currently compared to what he was experiencing last time I saw him. He states not quite as bad as what he had because this was infected previously but nonetheless is still appears to be doing poorly. Fortunately there is no evidence of systemic infection at this point. The patient tells me that he is not going to be able to afford the Santyl. He is still waiting to hear about the infusion therapy with his dermatologist. Apparently she wants an updated colonoscopy first. 08/10/18 on evaluation today patient appears to be doing better in regard to his left  lateral lower extremity ulcer. Fortunately he is showing signs of improvement in this regard he's actually been approved for Remicade infusion's as well although this has not been scheduled as of yet. Fortunately there's no signs of active infection at this time in regard to the wound  although he is having some issues with infection of the right lower extremity is been seen as dermatologist for this. Fortunately they are definitely still working with him trying to keep things under control. 09/07/18 on evaluation today patient is actually doing rather well in regard to his left lateral lower extremity ulcer. He notes these actually having some hair grow back on his extremity which is something he has not seen in years. He also tells me that the pain is really not giving them any trouble at this time which is also good news overall she is very pleased with the progress he's using a combination of the mupirocin along with the probate is all mixed. 09/21/18 on evaluation today patient actually appears to be doing fairly well all things considered in regard to his looks from the ulcer. He's been tolerating the dressing changes without complication. Fortunately there's no signs of active infection at this time which is good news he is still on all antibiotics or prevention of the staff infection. He has been on prednisone for time although he states it is gonna contact his dermatologist and see if she put them on a short course due to some irritation that he has going on currently. Fortunately there's no evidence of any overall worsening this is going very slow I think cental would be something that would be helpful for him although he states that $50 for tube is quite expensive. He therefore is not willing to get that at this point. 10/06/18 on evaluation today patient actually appears to be doing decently well in regard to his left lateral leg ulcer. He's been tolerating the dressing changes without complication. Fortunately there's no signs of active infection at this time. Overall I'm actually rather pleased with the progress he's making although it's slow he doesn't show any signs of infection and he does seem to be making some improvement. I do believe that he may need a switch up and  dressings to try to help this to heal more appropriately and quickly. 10/19/18 on evaluation today patient actually appears to be doing better in regard to his left lateral lower extremity ulcer. This is shown signs of having much less Slough buildup at this point due to the fact he has been using the [employer]. Obviously this is very good news. The overall size of the wound is not dramatically smaller but again the appearance is. 11/02/18 on evaluation today patient actually appears to be doing quite well in regard to his lower [place] ulcer. A lot of the skin around the ulcer is actually somewhat irritating at this point this seems to be more due to the dressing causing irritation from the adhesive that anything else. Fortunately there is no signs of active infection at this time. 11/24/18 on evaluation today patient appears to be doing a little worse in regard to his overall appearance of his lower extremity ulcer. There's more erythema and warmth around the wound unfortunately. He is currently on doxycycline which he has been on for some time. With that being said I'm not sure that seems to be helping with what appears to possibly be an acute cellulitis with regard to his left lower extremity ulcer. No fevers, chills, nausea,  or vomiting noted at this time. 12/08/18 on evaluation today patient's wounds actually appears to be doing significantly better compared to his last evaluation. He has been using Santyl along with alternating tripling about appointment as well as the steroid cream seems to be doing quite well and the wound is showing signs of improvement which is excellent news. Fortunately there's no evidence of infection and in fact his culture came back negative with only normal skin flora noted. 12/21/2018 upon evaluation today patient actually appears to be doing excellent with regard to his ulcer. This is actually the best that I have seen it since have been helping to take care of him. It is  both smaller as well as less slough noted on the surface of the wound and seems to be showing signs of good improvement with new skin growing from the edges. He has been using just the triamcinolone he does wonder if he can get a refill of that ointment today. 01/04/2019 upon evaluation today patient actually appears to be doing well with regard to his left lateral lower extremity ulcer. With that being said it does not appear to be that he is doing quite as well as last time as far as progression is concerned. There does not appear to be any signs of infection or significant irritation which is good news. With that being said I do believe that he may benefit from switching to a collagen based dressing based on how clean The wound appears. 01/18/2019 on evaluation today patient actually appears to be doing well with regard to his wound on the left lower extremity. He is not made a lot of progress compared to where we were previous but nonetheless does seem to be doing okay at this time which is good news. There is no signs of active infection which is also good news. My only concern currently is I do wish we can get him into utilizing the collagen dressing his insurance would not pay for the supplies that we ordered although it appears that he may be able to order this through his supply company that he typically utilizes. This is [employer]. Nonetheless he did try to order it during the office visit today and it appears this did go through. We will see if he can get that it is a different brand but nonetheless he has collagen and I do think will be beneficial. [name], [name] ([id number]) 02/01/2019 on evaluation today patient actually appears to be doing a little worse today in regard to the overall size of his wounds. Fortunately there is no signs of active infection at this time. That is visually. Nonetheless when this is happened before it was due to infection. For that reason were somewhat concerned  about that this time as well. 02/08/2019 on evaluation today patient unfortunately appears to be doing slightly worse with regard to his wound upon evaluation today. Is measuring a little deeper and a little larger unfortunately. I am not really sure exactly what is causing this to enlarge he actually did see his dermatologist she is going to see about initiating Humira for him. Subsequently she also did do steroid injections into the wound itself in the periphery. Nonetheless still nonetheless he seems to be getting a little bit larger he is gone back to just using the steroid cream topically which I think is appropriate. I would say hold off on the collagen for the time being is definitely a good thing to do. Based on the culture results which  we finally did get the final result back regarding it shows staph as the bacteria noted again that can be a normal skin bacteria based on the fact however he is having increased drainage and worsening of the wound measurement wise I would go ahead and place him on an antibiotic today I do believe for this. 02/15/2019 on evaluation today patient actually appears to be doing somewhat better in regard to his ulcer. There is no signs of worsening at this time I did review his culture results which showed evidence of Staphylococcus aureus but not MRSA. Again this could just be more related to the normal skin bacteria although he states the drainage has slowed down quite a bit he may have had a mild infection not just colonization. And was much smaller and then since around10/04/2019 on evaluation today patient appears to be doing unfortunately worse as far as the size of the wound. I really feel like that this is steadily getting larger again it had been doing excellent right at the beginning of September we have seen a steady increase in the area of the wound it is almost 2-1/2 times the size it was on September 1. Obviously this is a bad trend this is not wanting to  see. For that reason we went back to using just the topical triamcinolone cream which does seem to help with inflammation. I checked him for bacteria by way of culture and nothing showed positive there. I am considering giving him a short course of a tapering steroid Dosepak today to see if that is can be beneficial for him. The patient is in agreement with giving that a try. 03/08/2019 on evaluation today patient appears to be doing very well in comparison to last evaluation with regard to his lower extremity ulcer. This is showing signs of less inflammation and actually measuring slightly smaller compared to last time every other week over the past month and a half he has been measuring larger larger larger. Nonetheless I do believe that the issue has been inflammation the prednisone does seem to have been beneficial for him which is good news. No fevers, chills, nausea, vomiting, or diarrhea. 03/22/2019 on evaluation today patient appears to be doing about the same with regard to his leg ulcer. He has been tolerating the dressing changes without complication. With that being said the wound seems to be mostly arrested at its current size but really is not making any progress except for when we prescribed the prednisone. He did show some signs of dropping as far as the overall size of the wound during that interval week. Nonetheless this is something he is not on long-term at this point and unfortunately I think he is getting need either this or else the Humira which his dermatologist has discussed try to get approval for. With that being said he will be seeing his dermatologist on the 11th of this month that is November. 04/19/2019 on evaluation today patient appears to be doing really about the same the wound is measuring slightly larger compared to last time I saw him. He has not been into the office since November 2 due to the fact that he unfortunately had Covid as that his entire family. He tells  me that it was rough but they did pull-through and he seems to be doing much better. Fortunately there is no signs of active infection at this time. No fevers, chills, nausea, vomiting, or diarrhea. 05/10/2019 on evaluation today patient unfortunately appears to be doing  significantly worse as compared to last time I saw him. He does tell me that he has had his first dose of Humira and actually is scheduled to get the next one in the upcoming week. With that being said he tells me also that in the past several days he has been having a lot of issues with green drainage she showed me a picture this is more blue-green in color. He is also been having issues with increased sloughy buildup and the wound does appear to be larger today. Obviously this is not the direction that we want everything to take based on the starting of his Humira. Nonetheless I think this is definitely a result of likely infection and to be honest I think this is probably Pseudomonas causing the infection based on what I am seeing. 05/24/2019 on evaluation today patient unfortunately appears to be doing significantly worse compared to his prior evaluation with me 2 weeks ago. I did review his culture results which showed that he does have Staph aureus as well as Pseudomonas noted on the culture. Nonetheless the Levaquin that I prescribed for him does not appear to have been appropriate and in fact he tells me he is no longer experiencing the green drainage and discharge that he had at the last visit. Fortunately there is no signs of active infection at this time which is good news although the wound has significantly worsened it in fact is much deeper than it was previous. We have been utilizing up to this point triamcinolone ointment as the prescription topical of choice but at this time I really feel like that the wound is getting need to be packed in order to appropriately manage this due to the deeper nature of the wound.  Therefore something along the lines of an alginate dressing may be more appropriate. 05/31/2019 upon inspection today patient's wound actually showed signs of doing poorly at this point. Unfortunately he just does not seem to be making any good progress despite what we have tried. He actually did go ahead and pick up the Cipro and start taking that as he was noticing more green drainage he had previously completed the Levaquin that I prescribed for him as well. Nonetheless he missed his appointment for the seventh last week on Wednesday with the wound care center and [facility] where his dermatologist referred him. Obviously I do think a second opinion would be helpful at this point especially in light of the fact that the patient seems to be doing so poorly despite the fact that we have tried everything that I really know how at this point. The only thing that ever seems to have helped him in the past is when he was on high doses of continual steroids that did seem to make a difference for him. 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 06/07/2019 upon evaluation today patient unfortunately appears to be doing worse yet again with regard to his wound. In fact I am starting to question whether or not he may have a fluid pocket in the muscle at this point based on the bulging and the soft appearance to the central portion of the muscle area. There is not anything draining from the muscle itself at this time which is good news but nonetheless the wound is expanding.  I am not really seeing any results of the Humira as far as overall wound progression based on what I am seeing at this point. The patient has been referred for second opinion with regard to his wound to the [facility] wound care center by his dermatologist which I definitely am not in  opposition to. Unfortunately we tried multiple dressings in the past including collagen, alginate, and at one point even Hydrofera Blue. With that being said he is never really used it for any significant amount of time due to the fact that he often complains of pain associated with these dressings and then will go back to either using the Santyl which she has done intermittently or more frequently the triamcinolone. He is also using his own compression stockings. We have wrapped him in the past but again that was something else that he really was not a big fan of. 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 He seemed to do much better when he was on the steroids although honestly I understand that the steroids are not necessarily the best medication to be on long-term obviously 06/14/2019 on evaluation today patient appears to be doing actually a little bit better with regard to the overall appearance with his leg. Unfortunately he does continue to have issues with what appears to be some fluid underneath the muscle although he did see the wound specialty center at [facility] last week their main goals were to see about infusion therapy in place of the Humira as they feel like that is not quite strong enough. They also recommended that we continue with the treatment otherwise as we are they felt like that was appropriate and they are okay with him continuing to follow-up here with [place] in that regard. With that being said they are also sending him to the vein specialist there to see about vein stripping and if that would be of benefit for him. Subsequently they also  did not really address whether or not an ultrasound of the muscle area to see if there is anything that needs to be addressed here would be appropriate or not. For that reason I discussed this with him last week I think we may proceed down that road at this point. 06/21/2019 upon evaluation today patient's wound actually appears to be doing slightly better compared to previous evaluations. I do believe that he has made a difference with regard to the progression here with the use of oral steroids. Again in the past has been the only thing that is really calm things down. He does tell me that from [facility] is gotten a good news from there that there are no further vein stripping that is necessary at this point. I do not have that available for review today although the patient did relay this to me. He also did obtain and have the ultrasound of the wound completed which I did sign off on today. It does appear that there is no fluid collection under the muscle this is likely then just edematous tissue in general. That is also good news. Overall I still believe the inflammation is the main issue  here. He did inquire about the possibility of a wound VAC again with the muscle protruding like it is I am not really sure whether the wound VAC is necessarily ideal or not. That is something we will have to consider although I do believe he may need compression wrapping to try to help with edema control which could potentially be of benefit. 06/28/2019 on evaluation today patient appears to be doing slightly better measurement wise although this is not terribly smaller he least seems to be trending towards that direction. With that being said he still seems to have purulent drainage noted in the wound bed at this time. He has been on Levaquin followed by Cipro over the past month. Unfortunately he still seems to have some issues with active infection at this time. I did perform a culture last week in order to evaluate and  see if indeed there was still anything going on. Subsequently the culture did come back showing Pseudomonas which is consistent with the drainage has been having which is blue-green in color. He also has had an odor that again was somewhat consistent with Pseudomonas as well. Long story short it appears that the culture showed an intermediate finding with regard to how well the Cipro will work for the Pseudomonas infection. Subsequently being that he does not seem to be clearing up and at best what we are doing is just keeping this at [place] I think he may need to see infectious disease to discuss IV antibiotic options. 07/05/2019 upon evaluation today patient appears to be doing okay in regard to his leg ulcer. He has been tolerating the dressing changes at this point without complication. Fortunately there is no signs of active infection at this time which is good news. No fevers, chills, nausea, vomiting, or diarrhea. With that being said he does have an appointment with infectious disease tomorrow and his primary care on Wednesday. Again the reason for the infectious disease referral was due to the fact that he did not seem to be fully resolving with the use of oral antibiotics and therefore we were thinking that IV antibiotic therapy may be necessary secondary to the fact that there was an intermediate finding for how effective the Cipro may be. Nonetheless again he has been having a lot of purulent and even green drainage. Fortunately right now that seems to have calmed down over the past week with the reinitiation of the oral antibiotic. Nonetheless we will see what Dr. Megan Salon has to say. 07/12/2019 upon evaluation today patient appears to be doing about the same at this point in regard to his left lower extremity ulcer. Fortunately there is no signs of active infection at this time which is good news I do believe the Levaquin has been beneficial I did review Dr. Hale Bogus note and to be honest I  agree that the patient's leg does appear to be doing better currently. What we found in the past as he does not seem to really completely resolve he will stop the antibiotic and then subsequently things will revert back to having issues with blue-green drainage, increased pain, and overall worsening in general. Obviously that is the reason I sent him back to infectious disease. 07/19/2019 upon evaluation today patient appears to be doing roughly the same in size there is really no dramatic improvement. He has started back on the Levaquin at this point and though he seems to be doing okay he did still have a lot of blue/green drainage noted on evaluation  today unfortunately. I think that this is still indicative more likely of a Pseudomonas infection as previously noted and again he does see Dr. Megan Salon in just a couple of days. I do not know that were really able to effectively clear this with just oral antibiotics alone based on what I am seeing currently. Nonetheless we are still continue to try to manage as best we can with regard to the patient and his wound. I do think the wrap was helpful in decreasing the edema which is excellent news. No fevers, chills, nausea, vomiting, or diarrhea. 07/26/2019 upon evaluation today patient appears to be doing slightly better with regard to the overall appearance of the muscle there is no dark discoloration centrally. Fortunately there is no signs of active infection at this time. No fevers, chills, nausea, vomiting, or diarrhea. Patient's wound bed currently the patient did have an appointment with Dr. Megan Salon at infectious disease last week. With that being said Dr. Megan Salon the patient states was still somewhat hesitant about put him on any IV antibiotics he wanted [place] to repeat cultures today and then see where things go going forward. He does look like Dr. Megan Salon because of some improvement the patient did have with the Levaquin wanted [place] to see about  repeating cultures. If it indeed grows the Pseudomonas again then he recommended a possibility of considering a PICC line placement and IV antibiotic therapy. He plans to see the patient back in 1 to 2 weeks. 08/02/2019 upon evaluation today patient appears to be doing poorly with regard to his left lower extremity. We did get the results of his culture back it shows that he is still showing evidence of Pseudomonas which is consistent with the purulent/blue-green drainage that he has currently. Subsequently the culture also shows that he now is showing resistance to the oral fluoroquinolones which is unfortunate as that was really the only thing to treat the infection prior. I do believe that he is looking like this is going require IV antibiotic therapy to get this under control. Fortunately there is no signs of systemic infection at this time which is good news. The patient does see Dr. Megan Salon tomorrow. [name], [name] ([id number]) Objective Constitutional Well-nourished and well-hydrated in no acute distress. Vitals Time Taken: 8:20 AM, Height: 71 in, Weight: 338 lbs, BMI: 47.1, Temperature: 98.8 F, Pulse: 75 bpm, Respiratory Rate: 18 breaths/min, Blood Pressure: 160/86 mmHg. Respiratory normal breathing without difficulty. Psychiatric this patient is able to make decisions and demonstrates good insight into disease process. Alert and Oriented x 3. pleasant and cooperative. General Notes: Upon inspection today patient showed signs of poor granulation tissue in fact he had a lot of purulent drainage and the muscle exposed also appears to show some signs of necrosis. Overall I am not really pleased with what I am seeing at this time. Fortunately there is no evidence of active infection which is good news systemically although I think locally he does still show signs of fairly significant infection the wound is also measuring larger today. Integumentary (Hair, Skin) Wound #1 status is Open.  Original cause of wound was Gradually Appeared. The wound is located on the Left,Lateral Lower Leg. The wound measures 6.4cm length x 6.4cm width x 0.8cm depth; 32.17cm^2 area and 25.736cm^3 volume. There is muscle and Fat Layer (Subcutaneous Tissue) Exposed exposed. There is no tunneling or undermining noted. There is a large amount of serous drainage noted. The wound margin is epibole. There is medium (34-66%) pink granulation within the  wound bed. There is a medium (34-66%) amount of necrotic tissue within the wound bed including Adherent Slough. 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 Anesthetic (add to Medication List): Wound #1 Left,Lateral Lower Leg: Topical Lidocaine 4% cream applied to wound bed prior to debridement (In Clinic Only). Primary Wound Dressing: Alginate Secondary Dressing: Wound #1 Left,Lateral Lower Leg: ABD and Kerlix/Conform Dressing Change Frequency: Wound #1 Left,Lateral Lower Leg: Change dressing every day. Follow-up Appointments: Wound #1 Left,Lateral Lower Leg: Return Appointment in 1 week. Edema Control: SANAY, BELMAR ([id number]) Wound #1 Left,Lateral Lower Leg: Elevate legs to the level of the heart and pump ankles as often as possible - Tubi grip F 1. My suggestion at this time is good to be that we go ahead and switch to using plain alginate which the patient has some of at home and he can change this on a regular basis every day in order to keep the drainage under control. 2. I am also can recommend that he continue with elevating his leg is much as possible. We will also be using Tubigrip to try to help keep the edema under control I really do not want her wrap him until we get the infection under control and  right now it most definitely is not. 3. We will see Dr. Megan Salon has to say tomorrow regarding infectious disease and getting this infection under control which probably is can require PICC line placement. Obviously following that IV antibiotic therapy. We will see patient back for reevaluation in 1 week here in the clinic. If anything worsens or changes patient will contact our office for additional recommendations. Electronic Signature(s) Signed: 08/02/2019 9:03:24 AM By: Worthy Keeler PA-C Entered By: Worthy Keeler on 08/02/2019 09:03:23 [name] ([id number]) -------------------------------------------------------------------------------- SuperBill Details Patient Name: [name] Date of Service: 08/02/2019 Medical Record Number: [id number] Patient Account Number: [id number] Date of Birth/Sex: 1981-03-01 (39 y.o. M) Treating RN: Primary Care Provider: [name] Other Clinician: Referring Provider: [name] Treating Provider/Extender: Melburn Hake, Milbert Bixler Weeks in Treatment: 138 Diagnosis Coding ICD-10 Codes Code Description [phone] Non-pressure chronic ulcer of left calf with fat layer exposed L88 Pyoderma gangrenosum I87.2 Venous insufficiency (chronic) (peripheral) L03.116 Cellulitis of left lower limb Facility Procedures CPT4 Code: [id number] Description: 84132 - WOUND CARE VISIT-LEV 3 EST PT Modifier: Quantity: 1 Physician Procedures CPT4 Code: 4401027 Description: 25366 - WC PHYS LEVEL 4 - EST PT Modifier: Quantity: 1 CPT4 Code: Description: ICD-10 Diagnosis Description L97.222 Non-pressure chronic ulcer of left calf with fat layer exposed L88 Pyoderma gangrenosum I87.2 Venous insufficiency (chronic) (peripheral) L03.116 Cellulitis of left lower limb Modifier: Quantity: Electronic Signature(s) Signed: 08/02/2019 9:03:37 AM By: Worthy Keeler PA-C Entered By: Worthy Keeler on 08/02/2019 09:03:37

## 2019-08-03 ENCOUNTER — Telehealth: Payer: Self-pay

## 2019-08-03 ENCOUNTER — Ambulatory Visit (INDEPENDENT_AMBULATORY_CARE_PROVIDER_SITE_OTHER): Payer: 59 | Admitting: Internal Medicine

## 2019-08-03 DIAGNOSIS — L88 Pyoderma gangrenosum: Secondary | ICD-10-CM

## 2019-08-03 MED ORDER — DEXTROSE 5 % IV SOLN: 2.0000 g | Freq: Three times a day (TID) | INTRAVENOUS | Status: DC

## 2019-08-03 NOTE — Assessment & Plan Note (Signed)
His chronic, multifactorial leg ulcer is superinfected with Pseudomonas that is now resistant to all oral agents.  I will have a PICC placed and start him on ceftazidime.  He will receive a 2-week course and follow-up in 4 to 5 weeks.

## 2019-08-03 NOTE — Progress Notes (Signed)
[name] ([id number]) Visit Report for 08/02/2019 Arrival Information Details Patient Name: [name], [name] Date of Service: 08/02/2019 8:15 AM Medical Record Number: [id number] Patient Account Number: [id number] Date of Birth/Sex: 05-19-1981 (39 y.o. M) Treating RN: Primary Care [name]: [name] Other Clinician: Referring [name]: [name] Treating [name]/Extender: [name], [name] Weeks in Treatment: 18 Visit Information History Since Last Visit Added or deleted any medications: No Patient Arrived: Ambulatory Any new allergies or adverse reactions: No Arrival Time: 08:20 Had a fall or experienced change in No Accompanied By: self activities of daily living that may affect Transfer Assistance: None risk of falls: Patient Identification Verified: Yes Signs or symptoms of abuse/neglect since last visito No Secondary Verification Process Completed: Yes Hospitalized since last visit: No Patient Requires Transmission-Based Precautions: No Implantable device outside of the clinic excluding No Patient Has Alerts: No cellular tissue based products placed in the center since last visit: Has Dressing in Place as Prescribed: Yes Has Compression in Place as Prescribed: Yes Pain Present Now: Yes Electronic Signature(s) Signed: 08/02/2019 4:23:57 PM By: [name] [name] Entered By: [name] on 08/02/2019 08:22:25 [name] ([id number]) -------------------------------------------------------------------------------- Clinic Level of Care Assessment Details Patient Name: [name] Date of Service: 08/02/2019 8:15 AM Medical Record Number: [id number] Patient Account Number: [id number] Date of Birth/Sex: 08-30-1980 (39 y.o. M) Treating RN: [name] Primary Care [name]: [name] Other Clinician: Referring [name]: [name] Treating [name]/Extender: [name], [name] Weeks in Treatment: 138 Clinic  Level of Care Assessment Items TOOL 4 Quantity Score []  - Use when only an EandM is performed on FOLLOW-UP visit 0 ASSESSMENTS - Nursing Assessment / Reassessment X - Reassessment of Co-morbidities (includes updates in patient status) 1 10 X- 1 5 Reassessment of Adherence to Treatment Plan ASSESSMENTS - Wound and Skin Assessment / Reassessment X - Simple Wound Assessment / Reassessment - one wound 1 5 []  - 0 Complex Wound Assessment / Reassessment - multiple wounds []  - 0 Dermatologic / Skin Assessment (not related to wound area) ASSESSMENTS - Focused Assessment []  - Circumferential Edema Measurements - multi extremities 0 []  - 0 Nutritional Assessment / Counseling / Intervention X- 1 5 Lower Extremity Assessment (monofilament, tuning fork, pulses) []  - 0 Peripheral Arterial Disease Assessment (using hand held doppler) ASSESSMENTS - Ostomy and/or Continence Assessment and Care []  - Incontinence Assessment and Management 0 []  - 0 Ostomy Care Assessment and Management (repouching, etc.) PROCESS - Coordination of Care X - Simple Patient / Family Education for ongoing care 1 15 []  - 0 Complex (extensive) Patient / Family Education for ongoing care []  - 0 Staff obtains Programmer, systems, Records, Test Results / Process Orders []  - 0 Staff telephones HHA, Nursing Homes / Clarify orders / etc []  - 0 Routine Transfer to another Facility (non-emergent condition) []  - 0 Routine Hospital Admission (non-emergent condition) []  - 0 New Admissions / Biomedical engineer / Ordering NPWT, Apligraf, etc. []  - 0 Emergency Hospital Admission (emergent condition) X- 1 10 Simple Discharge Coordination []  - 0 Complex (extensive) Discharge Coordination PROCESS - Special Needs []  - Pediatric / Minor Patient Management 0 []  - 0 Isolation Patient Management []  - 0 Hearing / Language / Visual special needs []  - 0 Assessment of Community assistance (transportation, D/C planning, etc.) [name] ([id number]) []  - 0 Additional assistance / Altered mentation []  - 0 Support Surface(s) Assessment (bed, cushion, seat, etc.) INTERVENTIONS - Wound Cleansing / Measurement X - Simple Wound Cleansing - one wound 1 5 []  -  0 Complex Wound Cleansing - multiple wounds X- 1 5 Wound Imaging (photographs - any number of wounds) []  - 0 Wound Tracing (instead of photographs) X- 1 5 Simple Wound Measurement - one wound []  - 0 Complex Wound Measurement - multiple wounds INTERVENTIONS - Wound Dressings []  - Small Wound Dressing one or multiple wounds 0 X- 1 15 Medium Wound Dressing one or multiple wounds []  - 0 Large Wound Dressing one or multiple wounds []  - 0 Application of Medications - topical []  - 0 Application of Medications - injection INTERVENTIONS - Miscellaneous []  - External ear exam 0 []  - 0 Specimen Collection (cultures, biopsies, blood, body fluids, etc.) []  - 0 Specimen(s) / Culture(s) sent or taken to Lab for analysis []  - 0 Patient Transfer (multiple staff / Civil Service fast streamer / Similar devices) []  - 0 Simple Staple / Suture removal (25 or less) []  - 0 Complex Staple / Suture removal (26 or more) []  - 0 Hypo / Hyperglycemic Management (close monitor of Blood Glucose) []  - 0 Ankle / Brachial Index (ABI) - do not check if billed separately X- 1 5 Vital Signs Has the patient been seen at the hospital within the last three years: Yes Total Score: 85 Level Of Care: New/Established - Level 3 Electronic Signature(s) Signed: 08/03/2019 10:13:19 AM By: [name] Entered By: [name] on 08/02/2019 08:57:16 [name] ([id number]) -------------------------------------------------------------------------------- Encounter Discharge Information Details Patient Name: [name] Date of Service: 08/02/2019 8:15 AM Medical Record Number: [id number] Patient Account Number: [id number] Date of Birth/Sex: July 31, 1980 (39 y.o. M) Treating RN: [name] Primary Care  [name]: [name] Other Clinician: Referring [name]: [name] Treating [name]/Extender: [name], [name] Weeks in Treatment: 47 Encounter Discharge Information Items Discharge Condition: Stable Ambulatory Status: Ambulatory Discharge Destination: Home Transportation: Private Auto Accompanied By: self Schedule Follow-up Appointment: Yes Clinical Summary of Care: Electronic Signature(s) Signed: 08/03/2019 10:13:19 AM By: [name] Entered By: [name] on 08/02/2019 08:58:28 [name] ([id number]) -------------------------------------------------------------------------------- Lower Extremity Assessment Details Patient Name: [name] Date of Service: 08/02/2019 8:15 AM Medical Record Number: [id number] Patient Account Number: [id number] Date of Birth/Sex: [birth year] (39 y.o. M) Treating RN: [name] Primary Care Jansel Vonstein: [name] Other Clinician: Referring Yarielis Funaro: [name] Treating Amadea Keagy/Extender: [name], [name] Weeks in Treatment: 138 Edema Assessment Assessed: [Left: No] [Right: No] Edema: [Left: Ye] [Right: s] Calf Left: Right: Point of Measurement: 32 cm From Medial Instep 50 cm cm Ankle Left: Right: Point of Measurement: 10 cm From Medial Instep 30 cm cm Vascular Assessment Pulses: Dorsalis Pedis Palpable: [Left:Yes] Electronic Signature(s) Signed: 08/03/2019 10:13:19 AM By: [name] Entered By: [name] on 08/02/2019 08:28:12 [name] ([id number]) -------------------------------------------------------------------------------- Multi Wound Chart Details Patient Name: [name] Date of Service: 08/02/2019 8:15 AM Medical Record Number: [id number] Patient Account Number: [id number] Date of Birth/Sex: 01-02-1981 (39 y.o. M) Treating RN: [name] Primary Care Shawniece Oyola: [name] Other Clinician: Referring Tamika Shropshire: [name] Treating Salle Brandle/Extender: [name], [name] Weeks in  Treatment: 138 Vital Signs Height(in): 71 Pulse(bpm): 75 Weight(lbs): 338 Blood Pressure(mmHg): 160/86 Body Mass Index(BMI): 47 Temperature(F): 98.8 Respiratory Rate(breaths/min): 18 Photos: [N/A:N/A] Wound Location: Left Lower Leg - Lateral N/A N/A Wounding Event: Gradually Appeared N/A N/A Primary Etiology: Pyoderma N/A N/A Comorbid History: Sleep Apnea, Hypertension, Colitis N/A N/A Date Acquired: 11/18/2015 N/A N/A Weeks of Treatment: 138 N/A N/A Wound Status: Open N/A N/A Measurements L x W x D (cm) 6.4x6.4x0.8 N/A N/A Area (cm) : 32.17 N/A N/A Volume (cm) : 25.736 N/A  N/A % Reduction in Area: -555.30% N/A N/A % Reduction in Volume: -555.40% N/A N/A Classification: Full Thickness With Exposed N/A N/A Support Structures Exudate Amount: Large N/A N/A Exudate Type: Serous N/A N/A Exudate Color: amber N/A N/A Wound Margin: Epibole N/A N/A Granulation Amount: Medium (34-66%) N/A N/A Granulation Quality: Pink N/A N/A Necrotic Amount: Medium (34-66%) N/A N/A Exposed Structures: Fat Layer (Subcutaneous Tissue) N/A N/A Exposed: Yes Muscle: Yes Fascia: No Tendon: No Joint: No Bone: No Epithelialization: None N/A N/A Treatment Notes Electronic Signature(s) Signed: 08/03/2019 10:13:19 AM By: [name] Entered By: [name] on 08/02/2019 08:53:24 [name], [name] ([id number]) [name], [name] ([id number]) -------------------------------------------------------------------------------- Multi-Disciplinary Care Plan Details Patient Name: [name] Date of Service: 08/02/2019 8:15 AM Medical Record Number: [id number] Patient Account Number: [id number] Date of Birth/Sex: 1980/12/10 (39 y.o. M) Treating RN: [name] Primary Care Laurent Cargile: [name] Other Clinician: Referring Egon [name]: [name] Treating Maleyah [name]/Extender: [name], [name] Weeks in Treatment: 138 Active Inactive Venous Leg Ulcer Nursing Diagnoses: Knowledge deficit related to disease  process and management Potential for venous Insuffiency (use before diagnosis confirmed) Goals: Non-invasive venous studies are completed as ordered Date Initiated: 12/11/2016 Target Resolution Date: 03/13/2017 Goal Status: Active Patient will maintain optimal edema control Date Initiated: 12/11/2016 Target Resolution Date: 03/13/2017 Goal Status: Active Patient/caregiver will verbalize understanding of disease process and disease management Date Initiated: 12/11/2016 Target Resolution Date: 03/13/2017 Goal Status: Active Verify adequate tissue perfusion prior to therapeutic compression application Date Initiated: 12/11/2016 Target Resolution Date: 03/13/2017 Goal Status: Active Interventions: Assess peripheral edema status every visit. Compression as ordered Provide education on venous insufficiency Treatment Activities: Therapeutic compression applied : 12/11/2016 Notes: Wound/Skin Impairment Nursing Diagnoses: Impaired tissue integrity Knowledge deficit related to ulceration/compromised skin integrity Goals: Patient/caregiver will verbalize understanding of skin care regimen Date Initiated: 12/11/2016 Target Resolution Date: 03/13/2017 Goal Status: Active Ulcer/skin breakdown will have a volume reduction of 30% by week 4 Date Initiated: 12/11/2016 Target Resolution Date: 03/13/2017 Goal Status: Active Ulcer/skin breakdown will have a volume reduction of 50% by week 8 Date Initiated: 12/11/2016 Target Resolution Date: 03/13/2017 Goal Status: Active Ulcer/skin breakdown will have a volume reduction of 80% by week 12 Date Initiated: 12/11/2016 Target Resolution Date: 03/13/2017 [name], [name] ([id number]) Goal Status: Active Ulcer/skin breakdown will heal within 14 weeks Date Initiated: 12/11/2016 Target Resolution Date: 03/13/2017 Goal Status: Active Interventions: Assess patient/caregiver ability to obtain necessary supplies Assess patient/caregiver ability to perform  ulcer/skin care regimen upon admission and as needed Assess ulceration(s) every visit Provide education on ulcer and skin care Treatment Activities: Skin care regimen initiated : 12/11/2016 Topical wound management initiated : 12/11/2016 Notes: Electronic Signature(s) Signed: 08/03/2019 10:13:19 AM By: [name] Entered By: [name] on 08/02/2019 08:53:17 [name] ([id number]) -------------------------------------------------------------------------------- Pain Assessment Details Patient Name: [name] Date of Service: 08/02/2019 8:15 AM Medical Record Number: [id number] Patient Account Number: [id number] Date of Birth/Sex: 21-Nov-1980 (39 y.o. M) Treating RN: [name] Primary Care Rubi Tooley: [name] Other Clinician: Referring Sadey Yandell: [name] Treating Norman Piacentini/Extender: [name], [name] Weeks in Treatment: 138 Active Problems Location of Pain Severity and Description of Pain Patient Has Paino Yes Site Locations Pain Location: Pain in Ulcers Rate the pain. Current Pain Level: 3 Pain Management and Medication Current Pain Management: Electronic Signature(s) Signed: 08/03/2019 10:13:19 AM By: [name] Entered By: [name] on 08/02/2019 08:27:36 [name] ([id number]) -------------------------------------------------------------------------------- Patient/Caregiver Education Details Patient Name: [name] Date of Service: 08/02/2019 8:15 AM Medical Record Number: [id number] Patient Account Number: [id number] Date of  Birth/Gender: 01-Mar-1981 (39 y.o. M) Treating RN: [name] Primary Care Physician: [name] Other Clinician: Referring Physician: Alma [name] Treating Physician/Extender: Sharalyn Ink in Treatment: 46 Education Assessment Education Provided To: Patient Education Topics Provided Wound/Skin Impairment: Handouts: Caring for Your Ulcer Methods: Demonstration, Explain/Verbal Responses: State  content correctly Electronic Signature(s) Signed: 08/03/2019 10:13:19 AM By: [name] Entered By: [name] on 08/02/2019 08:57:28 [name], [name] ([id number]) -------------------------------------------------------------------------------- Wound Assessment Details Patient Name: [name] Date of Service: 08/02/2019 8:15 AM Medical Record Number: [id number] Patient Account Number: [id number] Date of Birth/Sex: November 24, 1980 (39 y.o. M) Treating RN: [name] Primary Care Elyshia Kumagai: [name] Other Clinician: Referring Elyanna Wallick: [name] Treating Tytan Sandate/Extender: [name], [name] Weeks in Treatment: 138 Wound Status Wound Number: 1 Primary Etiology: Pyoderma Wound Location: Left Lower Leg - Lateral Wound Status: Open Wounding Event: Gradually Appeared Comorbid History: Sleep Apnea, Hypertension, Colitis Date Acquired: 11/18/2015 Weeks Of Treatment: 138 Clustered Wound: No Photos Wound Measurements Length: (cm) 6.4 Width: (cm) 6.4 Depth: (cm) 0.8 Area: (cm) 32.17 Volume: (cm) 25.736 % Reduction in Area: -555.3% % Reduction in Volume: -555.4% Epithelialization: None Tunneling: No Undermining: No Wound Description Classification: Full Thickness With Exposed Support Structure Wound Margin: Epibole Exudate Amount: Large Exudate Type: Serous Exudate Color: amber s Foul Odor After Cleansing: No Slough/Fibrino Yes Wound Bed Granulation Amount: Medium (34-66%) Exposed Structure Granulation Quality: Pink Fascia Exposed: No Necrotic Amount: Medium (34-66%) Fat Layer (Subcutaneous Tissue) Exposed: Yes Necrotic Quality: Adherent Slough Tendon Exposed: No Muscle Exposed: Yes Necrosis of Muscle: No Joint Exposed: No Bone Exposed: No Treatment Notes Wound #1 (Left, Lateral Lower Leg) Notes plain AG, ABD, conform, tubi gripF Electronic Signature(s) [name], [name] ([id number]) Signed: 08/03/2019 10:13:19 AM By: [name] Entered By: [name] on  08/02/2019 [place] ([id number]) -------------------------------------------------------------------------------- Vitals Details Patient Name: [name] Date of Service: 08/02/2019 8:15 AM Medical Record Number: [id number] Patient Account Number: [id number] Date of Birth/Sex: Feb 06, 1981 (39 y.o. M) Treating RN: Primary Care Anahita Cua: [name] Other Clinician: Referring Ellisha Bankson: [name] Treating Malaney Mcbean/Extender: [name], [name] Weeks in Treatment: 138 Vital Signs Time Taken: 08:20 Temperature (F): 98.8 Height (in): 71 Pulse (bpm): 75 Weight (lbs): 338 Respiratory Rate (breaths/min): 18 Body Mass Index (BMI): 47.1 Blood Pressure (mmHg): 160/86 Reference Range: 80 - 120 mg / dl Electronic Signature(s) Signed: 08/02/2019 4:23:57 PM By: [name] [name] Entered By: Becky Sax, Amado Nash on 08/02/2019 08:24:29

## 2019-08-03 NOTE — Progress Notes (Signed)
[facility]  Patient Active Problem List   Diagnosis Date Noted  . 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 Normocytic anemia 07/07/2019  . Former cigarette smoker 06/17/2018  . Ulcerative colitis ([place]) 03/05/2018  . Venous stasis 02/24/2018  . Chronic pain of left knee 12/05/2017  . Chronic pain of left ankle 12/05/2017  . Type 2 diabetes mellitus ([place]) 07/15/2017  . Seasonal allergic rhinitis 11/25/2016  . Depression 04/17/2015  . Hyperlipidemia 04/17/2015    Patient's Medications  New Prescriptions   CEFTAZIDIME 2 G IN DEXTROSE 5 % 50 ML    Inject 2 g into the vein every 8 (eight) hours.  Previous Medications   ACETAMINOPHEN (TYLENOL) 500 MG TABLET    Take 1,000 mg by mouth every 6 (six) hours as needed for headache (pain).   ATORVASTATIN (LIPITOR) 40 MG TABLET    Take 1 tablet (40 mg total) by mouth every evening. For cholesterol.   BLOOD GLUCOSE METER KIT AND SUPPLIES KIT    Dispense based on patient and insurance preference. Use up to four times daily as directed. (FOR ICD-9 250.00, 250.01).   CLOBETASOL OINTMENT (TEMOVATE) 0.05 %    Apply 1 application topically 2 (two) times daily.    FLUTICASONE (FLONASE) 50 MCG/ACT NASAL SPRAY    Place 1 spray into both nostrils 2 (two) times daily.   HUMIRA PEN 40 MG/0.8ML PNKT    Inject 1 Syringe into the skin every 14 (fourteen) days.   IBUPROFEN (ADVIL,MOTRIN) 200 MG TABLET    Take 400-600 mg by mouth every 6 (six) hours as needed for headache (pain).   INSULIN GLARGINE (LANTUS SOLOSTAR) 100 UNIT/ML SOLOSTAR PEN    Inject 20 Units into the skin at bedtime.   INSULIN PEN NEEDLE (PEN NEEDLES) 31G X 6 MM MISC    Use nightly with insulin   METFORMIN (GLUCOPHAGE) 1000 MG TABLET    Take 1 tablet (1,000 mg total) by mouth 2 (two) times daily with a meal. For diabetes.   MUPIROCIN OINTMENT (BACTROBAN) 2 %    Apply topically two (2) 60g times a day. Mix with clobetasol and apply twice daily   PREDNISONE (STERAPRED UNI-PAK 48 TAB) 10 MG (48) TBPK TABLET       VENLAFAXINE XR (EFFEXOR-XR) 150 MG 24 HR CAPSULE    TAKE 1 CAPSULE BY MOUTH ONCE DAILY WITH BREAKFAST   VENLAFAXINE XR (EFFEXOR-XR) 37.5 MG 24 HR CAPSULE    Take 1 capsule (37.5 mg total) by mouth daily with breakfast. For depression  Modified Medications   No medications on file  Discontinued Medications   No medications on file    Subjective: [name] is in for his routine follow-up visit.  [name] has noted increased yellow-green drainage from his left leg wound and the wound has gotten bigger.  [name] is still on Humira. His dermatologist had recommended a switch to Remicade in January but [name] is still waiting on insurance approval.  Review of Systems: Review of Systems  Constitutional: Negative for fever.  Skin:       As noted in the HPI.    Past Medical History:  Diagnosis Date  . Blood in stool   . Depression   . Elevated blood pressure   . Hyperlipidemia   . OSA (obstructive sleep apnea) 2014  . Seasonal allergies   . Ulcerative colitis ([place]) 03/05/2018    Social History   Tobacco Use  . Smoking status:  Former Smoker    Packs/day: 2.00    Years: 14.00    Pack years: 28.00    Types: Cigarettes    Quit date: 11/25/2018    Years since quitting: 0.6  . Smokeless tobacco: Former Systems developer    Quit date: 03/17/2015  Substance Use Topics  . Alcohol use: Yes    Alcohol/week: 0.0 standard drinks    Comment: soical  . Drug use: Not on file    Family History  Problem Relation Age of Onset  . Alcohol abuse Paternal Aunt   . Alcohol abuse Paternal Uncle   . Stroke Paternal Uncle   . Hyperlipidemia Maternal Grandfather   . Hypertension Maternal Grandfather   . Diabetes Maternal Grandfather   . Alcohol abuse Maternal Grandfather   . Multiple sclerosis Mother   . Dementia Mother     Allergies  Allergen Reactions  . Biaxin [Clarithromycin] Other (See Comments)    Causes colitis flares  . Milk-Related Compounds  Hives    Objective: Vitals:   08/03/19 1446  Weight: (!) 354 lb (160.6 kg)   Body mass index is 49.37 kg/m.  Physical Exam Skin:    Comments: [name] has copious yellow-green drainage [name] has chronic lower leg ulcer.  There is no odor.       Lab Results    Problem List Items Addressed This Visit      High   Pyoderma gangrenosum    His chronic, multifactorial leg ulcer is superinfected with Pseudomonas that is now resistant to all oral agents.  I will have a PICC placed and start him on ceftazidime.  [name] will receive a 2-week course and follow-up in 4 to 5 weeks.      Relevant Medications   cefTAZidime 2 g in dextrose 5 % 50 mL   Other Relevant Orders   CBC   Basic metabolic panel   C-reactive protein   Sedimentation rate   IR Fluoro Guide CV Line Right       Michel Bickers, MD [facility] [place] [phone] pager   [phone] cell 08/03/2019, 3:09 PM

## 2019-08-03 NOTE — Telephone Encounter (Signed)
Per Dr. Megan Salon called [facility] to set up Picc line. Spoke with Caryl Pina who scheduled appt for 3/19 at 12. Pt must arrive at [facility] 15 mins before appt to check in. Will get first dose a short stay. Will fax orders to [facility] and [facility]. Patient is aware of appointment Aundria Rud, [place]

## 2019-08-04 LAB — CBC
HCT: 41.9 % (ref 38.5–50.0)
Hemoglobin: 13.6 g/dL (ref 13.2–17.1)
MCH: 29.4 pg (ref 27.0–33.0)
MCHC: 32.5 g/dL (ref 32.0–36.0)
MCV: 90.7 fL (ref 80.0–100.0)
MPV: 10 fL (ref 7.5–12.5)
Platelets: 240 10*3/uL (ref 140–400)
RBC: 4.62 10*6/uL (ref 4.20–5.80)
RDW: 11.9 % (ref 11.0–15.0)
WBC: 9.2 10*3/uL (ref 3.8–10.8)

## 2019-08-04 LAB — BASIC METABOLIC PANEL
BUN: 12 mg/dL (ref 7–25)
CO2: 30 mmol/L (ref 20–32)
Calcium: 9.5 mg/dL (ref 8.6–10.3)
Chloride: 102 mmol/L (ref 98–110)
Creat: 1.15 mg/dL (ref 0.60–1.35)
Glucose, Bld: 118 mg/dL — ABNORMAL HIGH (ref 65–99)
Potassium: 4.3 mmol/L (ref 3.5–5.3)
Sodium: 140 mmol/L (ref 135–146)

## 2019-08-04 LAB — SEDIMENTATION RATE: Sed Rate: 6 mm/h (ref 0–15)

## 2019-08-04 LAB — C-REACTIVE PROTEIN: CRP: 4.3 mg/L (ref <8.0)

## 2019-08-04 NOTE — Telephone Encounter (Signed)
Confirmation received that orders were faxed successfully to both [facility]. [place]

## 2019-08-05 ENCOUNTER — Other Ambulatory Visit (HOSPITAL_COMMUNITY): Payer: Self-pay | Admitting: *Deleted

## 2019-08-06 ENCOUNTER — Ambulatory Visit (HOSPITAL_COMMUNITY)
Admission: RE | Admit: 2019-08-06 | Discharge: 2019-08-06 | Disposition: A | Discharge status: Home / Self Care | Payer: 59 | Source: Ambulatory Visit | Attending: Internal Medicine | Admitting: Internal Medicine

## 2019-08-06 ENCOUNTER — Other Ambulatory Visit: Payer: Self-pay

## 2019-08-06 ENCOUNTER — Other Ambulatory Visit: Payer: Self-pay | Admitting: Internal Medicine

## 2019-08-06 ENCOUNTER — Encounter (HOSPITAL_COMMUNITY)
Admission: RE | Admit: 2019-08-06 | Discharge: 2019-08-06 | Disposition: A | Discharge status: Home / Self Care | Payer: 59 | Source: Ambulatory Visit | Attending: Primary Care | Admitting: Primary Care

## 2019-08-06 DIAGNOSIS — L089 Local infection of the skin and subcutaneous tissue, unspecified: Secondary | ICD-10-CM | POA: Diagnosis not present

## 2019-08-06 DIAGNOSIS — L88 Pyoderma gangrenosum: Secondary | ICD-10-CM | POA: Diagnosis not present

## 2019-08-06 MED ORDER — HEPARIN SOD (PORK) LOCK FLUSH 100 UNIT/ML IV SOLN
INTRAVENOUS | Status: AC
  Administered 2019-08-06: 250 [IU] via INTRAVENOUS
  Filled 2019-08-06: qty 5

## 2019-08-06 MED ORDER — CEFTAZIDIME 2 G IJ SOLR: 2.0000 g | Freq: Once | INTRAMUSCULAR | Status: DC

## 2019-08-06 MED ORDER — LIDOCAINE HCL 1 % IJ SOLN
INTRAMUSCULAR | Status: DC | PRN
  Administered 2019-08-06: 5 mL

## 2019-08-06 MED ORDER — LIDOCAINE HCL 1 % IJ SOLN
INTRAMUSCULAR | Status: AC
  Filled 2019-08-06: qty 20

## 2019-08-06 MED ORDER — SODIUM CHLORIDE 0.9 % IV SOLN
2.0000 g | Freq: Once | INTRAVENOUS | Status: AC
  Administered 2019-08-06: 2 g via INTRAVENOUS
  Filled 2019-08-06: qty 2

## 2019-08-06 MED ORDER — HEPARIN SOD (PORK) LOCK FLUSH 100 UNIT/ML IV SOLN: 500.0000 [IU] | Freq: Once | INTRAVENOUS | Status: AC

## 2019-08-06 NOTE — Procedures (Signed)
basiclic vein single lumen PICC placed without immediate complications. Length 45 cm tip SVC / right atrial junction. Okay to use. Medication used - 1& lidocaine and subcutaneous tissues. EBL < 2 ml.

## 2019-08-09 ENCOUNTER — Encounter: Payer: 59 | Admitting: Physician Assistant

## 2019-08-09 ENCOUNTER — Other Ambulatory Visit: Payer: Self-pay

## 2019-08-09 DIAGNOSIS — L97222 Non-pressure chronic ulcer of left calf with fat layer exposed: Secondary | ICD-10-CM | POA: Diagnosis not present

## 2019-08-09 NOTE — Progress Notes (Signed)
[name], [name] ([id number]) Visit Report for 08/09/2019 Arrival Information Details Patient Name: [name], [name] Date of Service: 08/09/2019 8:15 AM Medical Record Number: [id number] Patient Account Number: [id number] Date of Birth/Sex: 19-Dec-1980 (39 y.o. M) Treating RN: [name] Primary Care Virda Betters: [name] Other Clinician: Referring Jalie Eiland: [name] Treating Natania Finigan/Extender: Melburn Hake, [name]: 139 Visit Information History Since Last Visit Added or deleted any medications: Yes Patient Arrived: Ambulatory Has Dressing in Place as Prescribed: Yes Arrival Time: 08:22 Pain Present Now: No Accompanied By: self Transfer Assistance: None Patient Identification Verified: Yes Secondary Verification Process Completed: Yes Patient Requires Transmission-Based No Precautions: Patient Has Alerts: Yes Patient Alerts: 08/09/19 Picc Line Right Electronic Signature(s) Signed: 08/09/2019 5:11:36 PM By: [name], [name], RN, [name], [name], [name] Entered By: [name], [name], RN, [name], [name] on 08/09/2019 08:24:35 [name] ([id number]) -------------------------------------------------------------------------------- Clinic Level of Care Assessment Details Patient Name: [name] Date of Service: 08/09/2019 8:15 AM Medical Record Number: [id number] Patient Account Number: [id number] Date of Birth/Sex: 1980/06/14 (39 y.o. M) Treating RN: Army Melia Primary Care Eustacio Ellen: [name] Other Clinician: Referring Broxton Broady: [name] Treating Jazaria Jarecki/Extender: Melburn Hake, [name]: 139 Clinic Level of Care Assessment Items TOOL 4 Quantity Score []  - Use when only an EandM is performed on FOLLOW-UP visit 0 ASSESSMENTS - Nursing Assessment / Reassessment X - Reassessment of Co-morbidities (includes updates in patient status) 1 10 X- 1 5 Reassessment of Adherence to Treatment Plan ASSESSMENTS - Wound and Skin Assessment / Reassessment X - Simple  Wound Assessment / Reassessment - one wound 1 5 []  - 0 Complex Wound Assessment / Reassessment - multiple wounds []  - 0 Dermatologic / Skin Assessment (not related to wound area) ASSESSMENTS - Focused Assessment []  - Circumferential Edema Measurements - multi extremities 0 []  - 0 Nutritional Assessment / Counseling / Intervention []  - 0 Lower Extremity Assessment (monofilament, tuning fork, pulses) []  - 0 Peripheral Arterial Disease Assessment (using hand held doppler) ASSESSMENTS - Ostomy and/or Continence Assessment and Care []  - Incontinence Assessment and Management 0 []  - 0 Ostomy Care Assessment and Management (repouching, etc.) PROCESS - Coordination of Care X - Simple Patient / Family Education for ongoing care 1 15 []  - 0 Complex (extensive) Patient / Family Education for ongoing care []  - 0 Staff obtains Programmer, systems, Records, Test Results / Process Orders []  - 0 Staff telephones HHA, Nursing Homes / Clarify orders / etc []  - 0 Routine Transfer to another Facility (non-emergent condition) []  - 0 Routine Hospital Admission (non-emergent condition) []  - 0 New Admissions / Biomedical engineer / Ordering NPWT, Apligraf, etc. []  - 0 Emergency Hospital Admission (emergent condition) X- 1 10 Simple Discharge Coordination []  - 0 Complex (extensive) Discharge Coordination PROCESS - Special Needs []  - Pediatric / Minor Patient Management 0 []  - 0 Isolation Patient Management []  - 0 Hearing / Language / Visual special needs []  - 0 Assessment of Community assistance (transportation, D/C planning, etc.) [name], [name] ([id number]) []  - 0 Additional assistance / Altered mentation []  - 0 Support Surface(s) Assessment (bed, cushion, seat, etc.) INTERVENTIONS - Wound Cleansing / Measurement X - Simple Wound Cleansing - one wound 1 5 []  - 0 Complex Wound Cleansing - multiple wounds X- 1 5 Wound Imaging (photographs - any number of wounds) []  - 0 Wound Tracing (instead  of photographs) X- 1 5 Simple Wound Measurement - one wound []  - 0 Complex Wound Measurement - multiple wounds INTERVENTIONS - Wound Dressings []  - Small Wound  Dressing one or multiple wounds 0 X- 1 15 Medium Wound Dressing one or multiple wounds []  - 0 Large Wound Dressing one or multiple wounds []  - 0 Application of Medications - topical []  - 0 Application of Medications - injection INTERVENTIONS - Miscellaneous []  - External ear exam 0 []  - 0 Specimen Collection (cultures, biopsies, blood, body fluids, etc.) []  - 0 Specimen(s) / Culture(s) sent or taken to Lab for analysis []  - 0 Patient Transfer (multiple staff / Civil Service fast streamer / Similar devices) []  - 0 Simple Staple / Suture removal (25 or less) []  - 0 Complex Staple / Suture removal (26 or more) []  - 0 Hypo / Hyperglycemic Management (close monitor of Blood Glucose) []  - 0 Ankle / Brachial Index (ABI) - do not check if billed separately X- 1 5 Vital Signs Has the patient been seen at the hospital within the last three years: Yes Total Score: 80 Level Of Care: New/Established - Level 3 Electronic Signature(s) Signed: 08/09/2019 4:14:01 PM By: Army Melia Entered By: Army Melia on 08/09/2019 08:52:07 [name] ([id number]) -------------------------------------------------------------------------------- Encounter Discharge Information Details Patient Name: [name] Date of Service: 08/09/2019 8:15 AM Medical Record Number: [id number] Patient Account Number: [id number] Date of Birth/Sex: 14-Sep-1980 (39 y.o. M) Treating RN: Army Melia Primary Care Joncarlos Atkison: [name] Other Clinician: Referring Evon Lopezperez: [name] Treating Brooklinn Longbottom/Extender: Melburn Hake, [name]: 139 Encounter Discharge Information Items Discharge Condition: Stable Ambulatory Status: Ambulatory Discharge Destination: Home Transportation: Private Auto Accompanied By: self Schedule Follow-up Appointment:  Yes Clinical Summary of Care: Electronic Signature(s) Signed: 08/09/2019 4:14:01 PM By: Army Melia Entered By: Army Melia on 08/09/2019 08:53:36 [name], [name] ([id number]) -------------------------------------------------------------------------------- Lower Extremity Assessment Details Patient Name: [name] Date of Service: 08/09/2019 8:15 AM Medical Record Number: [id number] Patient Account Number: [id number] Date of Birth/Sex: Jan 09, 1981 (39 y.o. M) Treating RN: [name] Primary Care Giulia Hickey: [name] Other Clinician: Referring Valine Drozdowski: [name] Treating Eon Zunker/Extender: Melburn Hake, [name]: 139 Edema Assessment Assessed: [Left: No] [Right: No] [Left: Edema] [Right: :] Calf Left: Right: Point of Measurement: 32 cm From Medial Instep 50.2 cm cm Ankle Left: Right: Point of Measurement: 10 cm From Medial Instep 31 cm cm Vascular Assessment Pulses: Dorsalis Pedis Palpable: [Left:Yes] Electronic Signature(s) Signed: 08/09/2019 5:11:36 PM By: [name], [name], RN, [name], [name], [name] Entered By: [name], [name], RN, [name], [name] on 08/09/2019 08:30:17 [name] ([id number]) -------------------------------------------------------------------------------- Multi Wound Chart Details Patient Name: [name] Date of Service: 08/09/2019 8:15 AM Medical Record Number: [id number] Patient Account Number: [id number] Date of Birth/Sex: 12/18/1980 (39 y.o. M) Treating RN: Army Melia Primary Care Kali Ambler: [name] Other Clinician: Referring Abram Sax: [name] Treating Sahvanna Mcmanigal/Extender: Melburn Hake, [name]: 139 Vital Signs Height(in): 71 Pulse(bpm): 80 Weight(lbs): 338 Blood Pressure(mmHg): 143/80 Body Mass Index(BMI): 47 Temperature(F): 99.0 Respiratory Rate(breaths/min): 16 Photos: [N/A:N/A] Wound Location: Left Lower Leg - Lateral N/A N/A Wounding Event: Gradually Appeared N/A N/A Primary Etiology: Pyoderma N/A  N/A Comorbid History: Sleep Apnea, Hypertension, Colitis N/A N/A Date Acquired: 11/18/2015 N/A N/A Weeks of Treatment: 139 N/A N/A Wound Status: Open N/A N/A Measurements L x W x D (cm) 6.4x6.5x0.6 N/A N/A Area (cm) : 32.673 N/A N/A Volume (cm) : 19.604 N/A N/A % Reduction in Area: -565.60% N/A N/A % Reduction in Volume: -399.20% N/A N/A Classification: Full Thickness With Exposed N/A N/A Support Structures Exudate Amount: Large N/A N/A Exudate Type: Purulent N/A N/A Exudate Color: yellow, brown, green N/A N/A Wound Margin: Epibole N/A  N/A Granulation Amount: Small (1-33%) N/A N/A Granulation Quality: Pink N/A N/A Necrotic Amount: Large (67-100%) N/A N/A Exposed Structures: Fat Layer (Subcutaneous Tissue) N/A N/A Exposed: Yes Muscle: Yes Fascia: No Tendon: No Joint: No Bone: No Epithelialization: None N/A N/A Treatment Notes Electronic Signature(s) Signed: 08/09/2019 4:14:01 PM By: Army Melia Entered By: Army Melia on 08/09/2019 08:50:33 [name], [name] ([id number]) [name], [name] ([id number]) -------------------------------------------------------------------------------- Multi-Disciplinary Care Plan Details Patient Name: [name] Date of Service: 08/09/2019 8:15 AM Medical Record Number: [id number] Patient Account Number: [id number] Date of Birth/Sex: 1981-05-20 (39 y.o. M) Treating RN: Army Melia Primary Care Connor Foxworthy: [name] Other Clinician: Referring Laster Appling: [name] Treating Gisella Alwine/Extender: Melburn Hake, [name]: 139 Active Inactive Venous Leg Ulcer Nursing Diagnoses: Knowledge deficit related to disease process and management Potential for venous Insuffiency (use before diagnosis confirmed) Goals: Non-invasive venous studies are completed as ordered Date Initiated: 12/11/2016 Target Resolution Date: 03/13/2017 Goal Status: Active Patient will maintain optimal edema control Date Initiated: 12/11/2016 Target Resolution  Date: 03/13/2017 Goal Status: Active Patient/caregiver will verbalize understanding of disease process and disease management Date Initiated: 12/11/2016 Target Resolution Date: 03/13/2017 Goal Status: Active Verify adequate tissue perfusion prior to therapeutic compression application Date Initiated: 12/11/2016 Target Resolution Date: 03/13/2017 Goal Status: Active Interventions: Assess peripheral edema status every visit. Compression as ordered Provide education on venous insufficiency Treatment Activities: Therapeutic compression applied : 12/11/2016 Notes: Wound/Skin Impairment Nursing Diagnoses: Impaired tissue integrity Knowledge deficit related to ulceration/compromised skin integrity Goals: Patient/caregiver will verbalize understanding of skin care regimen Date Initiated: 12/11/2016 Target Resolution Date: 03/13/2017 Goal Status: Active Ulcer/skin breakdown will have a volume reduction of 30% by week 4 Date Initiated: 12/11/2016 Target Resolution Date: 03/13/2017 Goal Status: Active Ulcer/skin breakdown will have a volume reduction of 50% by week 8 Date Initiated: 12/11/2016 Target Resolution Date: 03/13/2017 Goal Status: Active Ulcer/skin breakdown will have a volume reduction of 80% by week 12 Date Initiated: 12/11/2016 Target Resolution Date: 03/13/2017 CHEVIS, WEISENSEL ([id number]) Goal Status: Active Ulcer/skin breakdown will heal within 14 weeks Date Initiated: 12/11/2016 Target Resolution Date: 03/13/2017 Goal Status: Active Interventions: Assess patient/caregiver ability to obtain necessary supplies Assess patient/caregiver ability to perform ulcer/skin care regimen upon admission and as needed Assess ulceration(s) every visit Provide education on ulcer and skin care Treatment Activities: Skin care regimen initiated : 12/11/2016 Topical wound management initiated : 12/11/2016 Notes: Electronic Signature(s) Signed: 08/09/2019 4:14:01 PM By: Army Melia Entered  By: Army Melia on 08/09/2019 08:50:21 [name], [name] ([id number]) -------------------------------------------------------------------------------- Pain Assessment Details Patient Name: [name] Date of Service: 08/09/2019 8:15 AM Medical Record Number: [id number] Patient Account Number: [id number] Date of Birth/Sex: 1981-03-08 (39 y.o. M) Treating RN: [name] Primary Care Arlo Buffone: [name] Other Clinician: Referring Quana Chamberlain: [name] Treating Jassmine Vandruff/Extender: Melburn Hake, [name]: 139 Active Problems Location of Pain Severity and Description of Pain Patient Has Paino Yes Site Locations Pain Location: Pain in Ulcers Rate the pain. Current Pain Level: 2 Worst Pain Level: 2 Least Pain Level: 2 Character of Pain Describe the Pain: Dull Pain Management and Medication Current Pain Management: Electronic Signature(s) Signed: 08/09/2019 5:11:36 PM By: [name], [name], RN, [name], [name], [name] Entered By: [name], [name], RN, [name], [name] on 08/09/2019 08:23:35 [name], [name] ([id number]) -------------------------------------------------------------------------------- Patient/Caregiver Education Details Patient Name: [name] Date of Service: 08/09/2019 8:15 AM Medical Record Number: [id number] Patient Account Number: [id number] Date of Birth/Gender: 1981-01-12 (39 y.o. M) Treating RN: Army Melia Primary Care Physician: [name] Other Clinician: Referring Physician: [name]  Treating Physician/Extender: Worthy Keeler Weeks in Treatment: 139 Education Assessment Education Provided To: Patient Education Topics Provided Wound/Skin Impairment: Handouts: Caring for Your Ulcer Methods: Demonstration, Explain/Verbal Responses: State content correctly Electronic Signature(s) Signed: 08/09/2019 4:14:01 PM By: Army Melia Entered By: Army Melia on 08/09/2019 08:53:09 [name], [name]  ([id number]) -------------------------------------------------------------------------------- Wound Assessment Details Patient Name: [name] Date of Service: 08/09/2019 8:15 AM Medical Record Number: [id number] Patient Account Number: [id number] Date of Birth/Sex: Jul 02, 1980 (39 y.o. M) Treating RN: [name] Primary Care Anessa Charley: [name] Other Clinician: Referring Zanayah [name]: [name] Treating Bambi Fehnel/Extender: Melburn Hake, [name]: 139 Wound Status Wound Number: 1 Primary Etiology: Pyoderma Wound Location: Left Lower Leg - Lateral Wound Status: Open Wounding Event: Gradually Appeared Comorbid History: Sleep Apnea, Hypertension, Colitis Date Acquired: 11/18/2015 Weeks Of Treatment: 139 Clustered Wound: No Photos Wound Measurements Length: (cm) 6.4 Width: (cm) 6.5 Depth: (cm) 0.6 Area: (cm) 32.673 Volume: (cm) 19.604 % Reduction in Area: -565.6% % Reduction in Volume: -399.2% Epithelialization: None Tunneling: No Undermining: No Wound Description Classification: Full Thickness With Exposed Support Structure Wound Margin: Epibole Exudate Amount: Large Exudate Type: Purulent Exudate Color: yellow, brown, green s Foul Odor After Cleansing: No Slough/Fibrino Yes Wound Bed Granulation Amount: Small (1-33%) Exposed Structure Granulation Quality: Pink Fascia Exposed: No Necrotic Amount: Large (67-100%) Fat Layer (Subcutaneous Tissue) Exposed: Yes Necrotic Quality: Adherent Slough Tendon Exposed: No Muscle Exposed: Yes Necrosis of Muscle: No Joint Exposed: No Bone Exposed: No Treatment Notes Wound #1 (Left, Lateral Lower Leg) Notes plain AG, ABD, conform, tubi gripF Electronic Signature(s) [name], [name] ([id number]) Signed: 08/09/2019 5:11:36 PM By: [name], [name], RN, [name], [name], [name] Entered By: [name], [name], RN, [name], [name] on 08/09/2019 08:29:10 [name]  ([id number]) -------------------------------------------------------------------------------- Vitals Details Patient Name: [name] Date of Service: 08/09/2019 8:15 AM Medical Record Number: [id number] Patient Account Number: [id number] Date of Birth/Sex: [birth year] (39 y.o. M) Treating RN: [name] Primary Care Artemio Dobie: [name] Other Clinician: Referring Glorianna Gott: [name] Treating Cutberto Winfree/Extender: Melburn Hake, [name]: 139 Vital Signs Time Taken: 08:23 Temperature (F): 99.0 Height (in): 71 Pulse (bpm): 80 Weight (lbs): 338 Respiratory Rate (breaths/min): 16 Body Mass Index (BMI): 47.1 Blood Pressure (mmHg): 143/80 Reference Range: 80 - 120 mg / dl Electronic Signature(s) Signed: 08/09/2019 5:11:36 PM By: [name], [name], RN, [name], [name], [name] Entered By: [name], [name], RN, [name], [name] on 08/09/2019 08:24:53

## 2019-08-09 NOTE — Progress Notes (Addendum)
DOMANIQUE, LUCKETT ([id number]) Visit Report for 08/09/2019 Chief Complaint Document Details Patient Name: [name], [name] Date of Service: 08/09/2019 8:15 AM Medical Record Number: [id number] Patient Account Number: [id number] Date of Birth/Sex: [birth year] (39 y.o. 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 M) Treating RN: Army Melia Primary Care Provider: [name] Other Clinician: Referring Provider: [name] Treating Provider/Extender: Melburn Hake, Kasiyah Platter Weeks in Treatment: 139 History of Present Illness HPI Description: 12/04/16; 39 year old man who comes into the clinic today for review of a wound on the posterior left calf. He tells me that is been there for about a year. He is not a diabetic he does smoke half a pack per day. He was seen in the ER on 11/20/16 felt to have cellulitis around the wound and was given clindamycin. An x-ray did not show osteomyelitis. The patient initially tells me that he has a milk allergy that sets off a pruritic itching rash on his lower legs which she scratches incessantly and he thinks that's what may have set up the wound. He has been using various topical antibiotics and ointments without any effect. He works in a [employer] and is on his feet all day. He does not have a prior  history of wounds however he does have the rash on both lower legs the right arm and the ventral aspect of his left arm. These are excoriations and clearly have had scratching however there are of macular looking areas on both legs including a substantial larger area on the right leg. This does not have an underlying open area. There is no blistering. The patient tells me that 2 years ago in [place] in response to the rash on his legs he saw a dermatologist who told him he had a condition which may be pyoderma gangrenosum although I may be putting words into his mouth. He seemed to recognize this. 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 He is not in any treatment for this. He's had no recent travel 12/11/16; the patient arrives today with his wound and roughly the same condition we've been using silver alginate this is a deep punched out wound with some surrounding erythema but no tenderness. Biopsy I did did not show confirmed pyoderma gangrenosum suggested nonspecific inflammation and vasculitis but does not provide an actual description of what was seen by the pathologist. I'm really not able to understand this We have also received information from the patient's dermatologist in [place] notes from April 2016. This was a doctor Agarwal-antal. 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 He was prescribed topical steroid high potency under occlusion which helped but at this point the patient did not have a deep punched out wound. 12/18/16; the patient's wound is larger in terms of surface area however this surface looks better and there is less depth. The surrounding erythema also is better. The patient states that the wrap we put on came off 2 days ago when he has been using his  compression stockings. He we are in the process of getting a dermatology consult. 12/26/16 on evaluation today patient's left lower extremity wound shows evidence of  infection with surrounding erythema noted. He has been tolerating the dressing changes but states that he has noted more discomfort. There is a larger area of erythema surrounding the wound. No fevers, chills, nausea, or vomiting noted at this time. With that being said the wound still does have slough covering the surface. He is not allergic to any medication that he is aware of at this point. In regard to his right lower extremity he had several regions that are erythematous and pruritic he wonders if there's anything we can do to help that. 01/02/17 I reviewed patient's wound culture which was obtained his visit last week. He was placed on doxycycline at that point. Unfortunately that does not appear to be an antibiotic that would likely help with the situation however the pseudomonas noted on culture is sensitive to Cipro. Also unfortunately patient's wound seems to have a large compared to last week's evaluation. Not severely so but there are definitely increased measurements in general. He is continuing to have discomfort as well he writes this to be a seven out of 10. In fact he would prefer me not to perform any debridement today due to the fact that he is having discomfort and considering he has an active infection on the little reluctant to do so anyway. No fevers, chills, nausea, or vomiting noted at this time. 01/08/17; patient seems dermatology on September 5. I suspect dermatology will want the slides from the biopsy I did sent to their pathologist. I'm not sure if there is a way we can expedite that. In any case the culture I did before I left on vacation 3 weeks ago showed Pseudomonas he was given 10 days of Cipro and per her description of her intake nurses is actually somewhat better this week although the wound is quite a bit bigger than I remember the last time I saw this. He still has 3 more days of Cipro 01/21/17; dermatology appointment tomorrow. He has completed the ciprofloxacin  for Pseudomonas. Surface of the wound looks better however he is had some deterioration in the lesions on his right leg. Meantime the left lateral leg wound we will continue with sample 01/29/17; patient had his dermatology appointment but I can't yet see that note. He is completed his antibiotics. The wound is more superficial but considerably larger in circumferential area than when he came in. This is in his left lateral calf. He also has swollen erythematous areas with superficial wounds on the right leg and small papular areas on both arms. There apparently areas in her his upper thighs and buttocks I did not look at those. Dermatology biopsied the right leg. Hopefully will have their input next week. 02/05/17; patient went back to see his dermatologist who told him that he had a "scratching problem" as well as staph. He is now on a 30 day course of doxycycline and I believe she gave him triamcinolone cream to the right leg areas to help with the itching [not exactly sure but probably triamcinolone]. She apparently looked at the left lateral leg wound although this was not rebiopsied and I think felt to be ultimately part of the same pathogenesis. He is using sample border foam and changing nevus himself. He now has a new open area on the right posterior leg which was his biopsy site I don't have any of the  dermatology notes 02/12/17; we put the patient in compression last week with SANTYL to the wound on the left leg and the biopsy. Edema is much better and the depth of the wound is now at level of skin. Area is still the same oBiopsy site on the right lateral leg we've also been using santyl with a border foam dressing and he is changing this himself. 02/19/17; Using silver alginate started last week to both the substantial left leg wound and the biopsy site on the right wound. He is tolerating compression well. Has a an appointment with his primary M.D. tomorrow wondering about diuretics although  I'm wondering if the edema problem is ZYLON, CREAMER ([id number]) actually lymphedema 02/26/17; the patient has been to see his primary doctor Dr. Jerrel Ivory at [facility] our primary care. She started him on Lasix 20 mg and this seems to have helped with the edema. However we are not making substantial change with the left lateral calf wound and inflammation. The biopsy site on the right leg also looks stable but not really all that different. 03/12/17; the patient has been to see vein and vascular Dr. Lucky Cowboy. He has had venous reflux studies I have not reviewed these. I did get a call from his dermatology office. 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 They therefore recommended clobetasol and tetracycline i.e. 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 03/26/17; apparently the patient just had reflux studies not an appointment with Dr. dew. She arrives in clinic today having applied clobetasol for 2-3 weeks. He notes over the last 2-3 days excessive drainage having to change the dressing 3-4 times a day and also expanding erythema. He states the expanding erythema seems to come and go and was last this red was earlier in the month.he is on doxycycline 150 mg twice a day as an anti- inflammatory systemic therapy for possible pyoderma gangrenosum along with the topical clobetasol 04/02/17; the patient was seen last week by Dr. Lillia Carmel at [facility] dermatology locally who kindly saw him at my request. 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 My concern was the degree of erythema medially extending from his left leg wound which was either inflammation from pyoderma  or cellulitis. I put him on Augmentin however culture of the wound showed Pseudomonas which is quinolone sensitive. I really don't believe he has cellulitis however in view of everything I will continue and give him a course of Cipro. He is also on doxycycline as an immune modulator for the pyoderma. In addition to his original wound on the left lateral leg with surrounding erythema he has a wound on the right posterior calf which was an original biopsy site done by dermatology. 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 Saw Dr. Lillia Carmel yesterday. He has been using topical antibiotics to both wound areas his original wound on the left and the biopsies/pathergy area on the right. There is definitely some improvement in the inflammation around the wound on the right although the patient states he has increasing sensitivity of the wounds. He is on prednisone 60 and doxycycline 1 as prescribed by Dr. Lillia Carmel. He is covering the topical  antibiotic with gauze and putting this in his own compression stocks and changing this daily. He states that Dr. Lottie Rater did a culture of the left leg wound yesterday 05/07/17; pyoderma gangrenosum. The patient saw Dr. Lillia Carmel yesterday and has a follow-up with her in one month. He is still using topical antibiotics to both wounds although he can't recall exactly what type. He is still on prednisone 60 mg. Dr. Lillia Carmel stated that the doxycycline could stop if we were in agreement. 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 The right medial leg was induced by biopsy/pathergy. The area on the right is essentially healed. 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 The right medial leg has closed and remains closed. He  is still on prednisone 60. oHe tells me he missed his last dermatology appointment with Dr. Lillia Carmel but will make another appointment. He reports that her blood sugar at a recent screen in [place] was high 200's. He was 180 today. 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 I think he is going to require still much longer prednisone perhaps another 3 months before attempting to taper. In the meantime his wound is a lot better. Smaller. He is cleaning this off daily and applying topical antibiotics. When he was last in the clinic I thought about changing to [facility] and actually put in a couple of calls to dermatology although probably not during their business hours. 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 He continues on oral prednisone. He has been using triple antibiotic ointment. There is surface debris and we will transition to [facility] and have him return in 2 weeks. He has lost 30 pounds since his last appointment with lifestyle modification. He may benefit from topical steroid cream for treatment this can be considered at a later date. 08/22/17 on evaluation today patient appears to actually be doing rather well in regard to his left lateral lower extremity ulcer. He has actually been managed by Dr. Dellia Nims most recently. Patient is currently on oral steroids at this time. This seems to have been of benefit for him. Nonetheless his last visit was actually with Leah on 08/06/17. Currently he is not utilizing any topical steroid creams although this could be of benefit as well. No fevers, chills, nausea, or vomiting noted at this time. 09/05/17 on evaluation today patient appears to be doing better in regard to his left lateral lower extremity ulcer. He has been tolerating the dressing changes without complication. He is using Santyl with good effect. Overall I'm  very pleased with how things are standing at this point. Patient likewise is happy that this is doing better. 09/19/17 on evaluation today patient actually appears to be doing rather well in regard to his left lateral lower extremity ulcer. 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 He's not having any significant pain. 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 He has been tolerating the Santyl without complication and in general I feel like he's making good progress. 10/17/17 on  evaluation today patient appears to be doing very well in regard to his left lateral lower surety ulcer. He has been tolerating the dressing changes without complication. There does not appear to be any evidence of infection he's alternating the Santyl and the triple antibiotic ointment every other day this seems to be doing well for him. 11/03/17 on evaluation today patient appears to be doing very well in regard to his left lateral lower extremity ulcer. He is been tolerating the dressing changes without complication which is good news. Fortunately there does not appear to be any evidence of infection which is also great news. Overall is doing excellent they are starting to taper down on the prednisone is down to 40 mg at this point it also started topical clobetasol for him. 11/17/17 on evaluation today patient appears to be doing well in regard to his left lateral lower surety ulcer. He's been tolerating the dressing changes without complication. He does note that he is having no pain, no excessive drainage or discharge, and overall he feels like things are going about how he would expect and hope they would. Overall he seems to have no evidence of infection at this time in my opinion which is good news. 12/04/17-He is seen in follow-up  evaluation for right lateral lower extremity ulcer. He has been applying topical steroid cream. Today's measurement show slight increase in size. Over the next 2 weeks we will transition to every other day Santyl and steroid cream. He has been encouraged to monitor for changes and notify clinic with any concerns 12/15/17 on evaluation today patient's left lateral motion the ulcer and fortunately is doing worse again at this point. This just since last week to this [place] ([id number]) week has close to doubled in size according to the patient. I did not seeing last week's I do not have a visual to compare this to in our system was also down so we do not have all the charts and at this point. Nonetheless it does have me somewhat concerned in regard to the fact that again he was worried enough about it he has contact the dermatology that placed them back on the full strength, 50 mg a day of the prednisone that he was taken previous. He continues to alternate using clobetasol along with Santyl at this point. He is obviously somewhat frustrated. 12/22/17 on evaluation today patient appears to be doing a little worse compared to last evaluation. Unfortunately the wound is a little deeper and slightly larger than the last week's evaluation. With that being said he has made some progress in regard to the irritation surrounding at this time unfortunately despite that progress that's been made he still has a significant issue going on here. 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 For that reason I discussed with him today the possibility of perform a wound culture to ensure there's nothing overtly infected. 01/06/18 on evaluation today patient's wound is larger and deeper than previously evaluated. With that being said it did appear that his wound  was infected after my last evaluation with him. Subsequently I did end up prescribing a prescription for Bactrim DS which she has been taking and having no complication with. Fortunately there does not appear to be any evidence of infection at this point in time as far as anything spreading, no want to touch, and overall  I feel like things are showing signs of improvement. 01/13/18 on evaluation today patient appears to be even a little larger and deeper than last time. There still muscle exposed in the base of the wound. Nonetheless he does appear to be less erythematous I do believe inflammation is calming down also believe the infection looks like it's probably resolved at this time based on what I'm seeing. No fevers, chills, nausea, or vomiting noted at this time. 01/30/18 on evaluation today patient actually appears to visually look better for the most part. Unfortunately those visually this looks better he does seem to potentially have what may be an abscess in the muscle that has been noted in the central portion of the wound. This is the first time that I have noted what appears to be fluctuance in the central portion of the muscle. With that being said I'm somewhat more concerned about the fact that this might indicate an abscess formation at this location. I do believe that an ultrasound would be appropriate. This is likely something we need to try to do as soon as possible. He has been switch to mupirocin ointment and he is no longer using the steroid ointment as prescribed by dermatology he sees them again next week he's been decreased from 60 to 40 mg of prednisone. 03/09/18 on evaluation today patient actually appears to be doing a little better compared to last time I saw him. There's not as much erythema surrounding the wound itself. He I did review his most recent infectious disease note which was dated 02/24/18. He saw Dr. Michel Bickers in [place]. 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 Patient is now off of antibiotics and they are continually observing this. There seems to be no change in the past two weeks in my pinion based on what the patient says and what I see today compared to what Dr. Megan Salon likely saw two weeks ago. No fevers, chills, nausea, or vomiting noted at this time. 03/23/18 on evaluation today patient's wound actually appears to be showing signs of improvement which is good news. He is currently still on the Dapsone. He is also working on tapering the prednisone to get off of this and Dr. Lottie Rater is working with him in this regard. Nonetheless overall I feel like the wound is doing well it does appear based on the infectious disease note that I reviewed from Dr. 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 I did also review the results of his ultrasound of left lower extremity which revealed there was a dentist tissue in the base of the wound without an abscess noted. 04/06/18 on evaluation today the patient's left lateral lower extremity ulcer actually appears to be doing fairly well which is excellent news. There does not appear to be any evidence of infection at this time which is also great news. Overall he still does have a significantly large ulceration although little by little he seems to be making progress. He is down to 10 mg a day of the prednisone. 04/20/18 on evaluation today patient actually appears to be doing excellent at this time in regard to his left lower extremity ulcer. He's making signs of good progress unfortunately this is taking much longer than we would really like to see but  nonetheless he is making progress. Fortunately there does not appear to be any evidence of infection at this time. No fevers, chills, nausea, or vomiting noted  at this time. The patient has not been using the Santyl due to the cost he hadn't got in this field yet. He's mainly been using the antibiotic ointment topically. Subsequently he also tells me that he really has not been scrubbing in the shower I think this would be helpful again as I told him it doesn't have to be anything too aggressive to even make it believe just enough to keep it free of some of the loose slough and biofilm on the wound surface. 05/11/18 on evaluation today patient's wound appears to be making slow but sure progress in regard to the left lateral lower extremity ulcer. He is been tolerating the dressing changes without complication. Fortunately there does not appear to be any evidence of infection at this time. He is still just using triple antibiotic ointment along with clobetasol occasionally over the area. He never got the Santyl and really does not seem to intend to in my pinion. 06/01/18 on evaluation today patient appears to be doing a little better in regard to his left lateral lower extremity ulcer. He states that overall he does not feel like he is doing as well with the Dapsone as he did with the prednisone. Nonetheless he sees his dermatologist later today and is gonna talk to them about the possibility of going back on the prednisone. Overall again I believe that the wound would be better if you would utilize Santyl but he really does not seem to be interested in going back to the [place] at this point. He has been using triple antibiotic ointment. 06/15/18 on evaluation today patient's wound actually appears to be doing about the same at this point. Fortunately there is no signs of infection at this time. He has made slight improvements although he continues to not really want to clean the wound bed at this point. He states that he just doesn't mess with it he doesn't want to cause any problems with everything else he has going on. He has been on medication, antibiotics as  prescribed by his dermatologist, for a staff infection of his lower extremities which is really drying out now and looking much better he tells me. Fortunately there is no sign of overall infection. 06/29/18 on evaluation today patient appears to be doing well in regard to his left lateral lower surety ulcer all things considering. Fortunately his staff infection seems to be greatly improved compared to previous. He has no signs of infection and this is drying up quite nicely. He is still the doxycycline for this is no longer on cental, Dapsone, or any of the other medications. His dermatologist has recommended possibility of an infusion but right now he does not want to proceed with that. 07/13/18 on evaluation today patient appears to be doing about the same in regard to his left lateral lower surety ulcer. Fortunately there's no signs of infection at this time which is great news. Unfortunately he still builds up a significant amount of Slough/biofilm of the surface of the wound he still is not Loisel, Jeorge ([id number]) really cleaning this as he should be appropriately. Again I'm able to easily with saline and gauze remove the majority of this on the surface which if you would do this at home would likely be a dramatic improvement for him as far as getting the area to improve. Nonetheless  overall I still feel like he is making progress is just very slow. I think Santyl will be of benefit for him as well. Still he has not gotten this as of this point. 07/27/18 on evaluation today patient actually appears to be doing little worse in regards of the erythema around the periwound region of the wound he also tells me that he's been having more drainage currently compared to what he was experiencing last time I saw him. He states not quite as bad as what he had because this was infected previously but nonetheless is still appears to be doing poorly. Fortunately there is no evidence of systemic infection at  this point. The patient tells me that he is not going to be able to afford the Santyl. He is still waiting to hear about the infusion therapy with his dermatologist. Apparently she wants an updated colonoscopy first. 08/10/18 on evaluation today patient appears to be doing better in regard to his left lateral lower extremity ulcer. Fortunately he is showing signs of improvement in this regard he's actually been approved for Remicade infusion's as well although this has not been scheduled as of yet. Fortunately there's no signs of active infection at this time in regard to the wound although he is having some issues with infection of the right lower extremity is been seen as dermatologist for this. Fortunately they are definitely still working with him trying to keep things under control. 09/07/18 on evaluation today patient is actually doing rather well in regard to his left lateral lower extremity ulcer. He notes these actually having some hair grow back on his extremity which is something he has not seen in years. He also tells me that the pain is really not giving them any trouble at this time which is also good news overall she is very pleased with the progress he's using a combination of the mupirocin along with the probate is all mixed. 09/21/18 on evaluation today patient actually appears to be doing fairly well all things considered in regard to his looks from the ulcer. He's been tolerating the dressing changes without complication. Fortunately there's no signs of active infection at this time which is good news he is still on all antibiotics or prevention of the staff infection. He has been on prednisone for time although he states it is gonna contact his dermatologist and see if she put them on a short course due to some irritation that he has going on currently. Fortunately there's no evidence of any overall worsening this is going very slow I think cental would be something that would be  helpful for him although he states that $50 for tube is quite expensive. He therefore is not willing to get that at this point. 10/06/18 on evaluation today patient actually appears to be doing decently well in regard to his left lateral leg ulcer. He's been tolerating the dressing changes without complication. Fortunately there's no signs of active infection at this time. Overall I'm actually rather pleased with the progress he's making although it's slow he doesn't show any signs of infection and he does seem to be making some improvement. I do believe that he may need a switch up and dressings to try to help this to heal more appropriately and quickly. 10/19/18 on evaluation today patient actually appears to be doing better in regard to his left lateral lower extremity ulcer. This is shown signs of having much less Slough buildup at this point due to the fact he  has been using the Santyl. Obviously this is very good news. The overall size of the wound is not dramatically smaller but again the appearance is. 11/02/18 on evaluation today patient actually appears to be doing quite well in regard to his lower [place] ulcer. A lot of the skin around the ulcer is actually somewhat irritating at this point this seems to be more due to the dressing causing irritation from the adhesive that anything else. Fortunately there is no signs of active infection at this time. 11/24/18 on evaluation today patient appears to be doing a little worse in regard to his overall appearance of his lower extremity ulcer. There's more erythema and warmth around the wound unfortunately. He is currently on doxycycline which he has been on for some time. With that being said I'm not sure that seems to be helping with what appears to possibly be an acute cellulitis with regard to his left lower extremity ulcer. No fevers, chills, nausea, or vomiting noted at this time. 12/08/18 on evaluation today patient's wounds actually appears to  be doing significantly better compared to his last evaluation. He has been using Santyl along with alternating tripling about appointment as well as the steroid cream seems to be doing quite well and the wound is showing signs of improvement which is excellent news. Fortunately there's no evidence of infection and in fact his culture came back negative with only normal skin flora noted. 12/21/2018 upon evaluation today patient actually appears to be doing excellent with regard to his ulcer. This is actually the best that I have seen it since have been helping to take care of him. It is both smaller as well as less slough noted on the surface of the wound and seems to be showing signs of good improvement with new skin growing from the edges. He has been using just the triamcinolone he does wonder if he can get a refill of that ointment today. 01/04/2019 upon evaluation today patient actually appears to be doing well with regard to his left lateral lower extremity ulcer. With that being said it does not appear to be that he is doing quite as well as last time as far as progression is concerned. There does not appear to be any signs of infection or significant irritation which is good news. With that being said I do believe that he may benefit from switching to a collagen based dressing based on how clean The wound appears. 01/18/2019 on evaluation today patient actually appears to be doing well with regard to his wound on the left lower extremity. He is not made a lot of progress compared to where we were previous but nonetheless does seem to be doing okay at this time which is good news. There is no signs of active infection which is also good news. My only concern currently is I do wish we can get him into utilizing the collagen dressing his insurance would not pay for the supplies that we ordered although it appears that he may be able to order this through his supply company that he typically utilizes.  This is [employer]. Nonetheless he did try to order it during the office visit today and it appears this did go through. We will see if he can get that it is a different brand but nonetheless he has collagen and I do think will be beneficial. 02/01/2019 on evaluation today patient actually appears to be doing a little worse today in regard to the overall size of his  wounds. Fortunately there is no signs of active infection at this time. That is visually. Nonetheless when this is happened before it was due to infection. For that reason were somewhat concerned about that this time as well. 02/08/2019 on evaluation today patient unfortunately appears to be doing slightly worse with regard to his wound upon evaluation today. Is measuring a Detzel, [name] ([id number]) little deeper and a little larger unfortunately. I am not really sure exactly what is causing this to enlarge he actually did see his dermatologist she is going to see about initiating Humira for him. Subsequently she also did do steroid injections into the wound itself in the periphery. Nonetheless still nonetheless he seems to be getting a little bit larger he is gone back to just using the steroid cream topically which I think is appropriate. I would say hold off on the collagen for the time being is definitely a good thing to do. Based on the culture results which we finally did get the final result back regarding it shows staph as the bacteria noted again that can be a normal skin bacteria based on the fact however he is having increased drainage and worsening of the wound measurement wise I would go ahead and place him on an antibiotic today I do believe for this. 02/15/2019 on evaluation today patient actually appears to be doing somewhat better in regard to his ulcer. There is no signs of worsening at this time I did review his culture results which showed evidence of Staphylococcus aureus but not MRSA. Again this could just be more related  to the normal skin bacteria although he states the drainage has slowed down quite a bit he may have had a mild infection not just colonization. And was much smaller and then since around10/04/2019 on evaluation today patient appears to be doing unfortunately worse as far as the size of the wound. I really feel like that this is steadily getting larger again it had been doing excellent right at the beginning of September we have seen a steady increase in the area of the wound it is almost 2-1/2 times the size it was on September 1. Obviously this is a bad trend this is not wanting to see. For that reason we went back to using just the topical triamcinolone cream which does seem to help with inflammation. I checked him for bacteria by way of culture and nothing showed positive there. I am considering giving him a short course of a tapering steroid Dosepak today to see if that is can be beneficial for him. The patient is in agreement with giving that a try. 03/08/2019 on evaluation today patient appears to be doing very well in comparison to last evaluation with regard to his lower extremity ulcer. This is showing signs of less inflammation and actually measuring slightly smaller compared to last time every other week over the past month and a half he has been measuring larger larger larger. Nonetheless I do believe that the issue has been inflammation the prednisone does seem to have been beneficial for him which is good news. No fevers, chills, nausea, vomiting, or diarrhea. 03/22/2019 on evaluation today patient appears to be doing about the same with regard to his leg ulcer. He has been tolerating the dressing changes without complication. With that being said the wound seems to be mostly arrested at its current size but really is not making any progress except for when we prescribed the prednisone. He did show some signs of dropping  as far as the overall size of the wound during that interval week.  Nonetheless this is something he is not on long-term at this point and unfortunately I think he is getting need either this or else the Humira which his dermatologist has discussed try to get approval for. With that being said he will be seeing his dermatologist on the 11th of this month that is November. 04/19/2019 on evaluation today patient appears to be doing really about the same the wound is measuring slightly larger compared to last time I saw him. He has not been into the office since November 2 due to the fact that he unfortunately had Covid as that his entire family. He tells me that it was rough but they did pull-through and he seems to be doing much better. Fortunately there is no signs of active infection at this time. No fevers, chills, nausea, vomiting, or diarrhea. 05/10/2019 on evaluation today patient unfortunately appears to be doing significantly worse as compared to last time I saw him. He does tell me that he has had his first dose of Humira and actually is scheduled to get the next one in the upcoming week. With that being said he tells me also that in the past several days he has been having a lot of issues with green drainage she showed me a picture this is more blue-green in color. He is also been having issues with increased sloughy buildup and the wound does appear to be larger today. Obviously this is not the direction that we want everything to take based on the starting of his Humira. Nonetheless I think this is definitely a result of likely infection and to be honest I think this is probably Pseudomonas causing the infection based on what I am seeing. 05/24/2019 on evaluation today patient unfortunately appears to be doing significantly worse compared to his prior evaluation with me 2 weeks ago. I did review his culture results which showed that he does have Staph aureus as well as Pseudomonas noted on the culture. Nonetheless the Levaquin that I prescribed for him does  not appear to have been appropriate and in fact he tells me he is no longer experiencing the green drainage and discharge that he had at the last visit. Fortunately there is no signs of active infection at this time which is good news although the wound has significantly worsened it in fact is much deeper than it was previous. We have been utilizing up to this point triamcinolone ointment as the prescription topical of choice but at this time I really feel like that the wound is getting need to be packed in order to appropriately manage this due to the deeper nature of the wound. Therefore something along the lines of an alginate dressing may be more appropriate. 05/31/2019 upon inspection today patient's wound actually showed signs of doing poorly at this point. Unfortunately he just does not seem to be making any good progress despite what we have tried. He actually did go ahead and pick up the Cipro and start taking that as he was noticing more green drainage he had previously completed the Levaquin that I prescribed for him as well. Nonetheless he missed his appointment for the seventh last week on Wednesday with the wound care center and [facility] where his dermatologist referred him. Obviously I do think a second opinion would be helpful at this point especially in light of the fact that the patient seems to be doing so poorly  despite the fact that we have tried everything that I really know how at this point. The only thing that ever seems to have helped him in the past is when he was on high doses of continual steroids that did seem to make a difference for him. 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 06/07/2019 upon evaluation today patient unfortunately appears to be doing worse yet again with regard to his wound. In fact I am  starting to question whether or not he may have a fluid pocket in the muscle at this point based on the bulging and the soft appearance to the central portion of the muscle area. There is not anything draining from the muscle itself at this time which is good news but nonetheless the wound is expanding. I am not really seeing any results of the Humira as far as overall wound progression based on what I am seeing at this point. The patient has been referred for second opinion with regard to his wound to the [facility] wound care center by his dermatologist which I definitely am not in opposition to. Unfortunately we tried multiple dressings in the past including collagen, alginate, and at one point even Hydrofera Blue. With that being said he is never really used it for any significant amount of time due to the fact that he often complains of pain associated with these dressings and then will go back to either using the Santyl which she has done intermittently or more frequently the triamcinolone. He is also using his own compression stockings. We have wrapped him in the past but again that was something else that he really was not a big fan of. 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 He seemed to do much better when he was on the steroids although honestly I understand that the steroids are not necessarily the best medication to be on long-term obviously Manske, Isom ([id number]) 06/14/2019 on evaluation today patient appears to be doing actually a little bit better with regard to the overall appearance with his leg. Unfortunately he does continue to have issues with what appears to be  some fluid underneath the muscle although he did see the wound specialty center at [facility] last week their main goals were to see about infusion therapy in place of the Humira as they feel like that is not quite strong enough. They also recommended that we continue with the treatment otherwise as we are they felt like that was appropriate and they are okay with him continuing to follow-up here with [place] in that regard. With that being said they are also sending him to the vein specialist there to see about vein stripping and if that would be of benefit for him. Subsequently they also did not really address whether or not an ultrasound of the muscle area to see if there is anything that needs to be addressed here would be appropriate or not. For that reason I discussed this with  him last week I think we may proceed down that road at this point. 06/21/2019 upon evaluation today patient's wound actually appears to be doing slightly better compared to previous evaluations. I do believe that he has made a difference with regard to the progression here with the use of oral steroids. Again in the past has been the only thing that is really calm things down. He does tell me that from [facility] is gotten a good news from there that there are no further vein stripping that is necessary at this point. I do not have that available for review today although the patient did relay this to me. He also did obtain and have the ultrasound of the wound completed which I did sign off on today. It does appear that there is no fluid collection under the muscle this is likely then just edematous tissue in general. That is also good news. Overall I still believe the inflammation is the main issue here. He did inquire about the possibility of a wound VAC again with the muscle protruding like it is I am not really sure whether the wound VAC is necessarily ideal or not. That is something we will have to consider although I do believe he may  need compression wrapping to try to help with edema control which could potentially be of benefit. 06/28/2019 on evaluation today patient appears to be doing slightly better measurement wise although this is not terribly smaller he least seems to be trending towards that direction. With that being said he still seems to have purulent drainage noted in the wound bed at this time. He has been on Levaquin followed by Cipro over the past month. Unfortunately he still seems to have some issues with active infection at this time. I did perform a culture last week in order to evaluate and see if indeed there was still anything going on. Subsequently the culture did come back showing Pseudomonas which is consistent with the drainage has been having which is blue-green in color. He also has had an odor that again was somewhat consistent with Pseudomonas as well. Long story short it appears that the culture showed an intermediate finding with regard to how well the Cipro will work for the Pseudomonas infection. Subsequently being that he does not seem to be clearing up and at best what we are doing is just keeping this at [place] I think he may need to see infectious disease to discuss IV antibiotic options. 07/05/2019 upon evaluation today patient appears to be doing okay in regard to his leg ulcer. He has been tolerating the dressing changes at this point without complication. Fortunately there is no signs of active infection at this time which is good news. No fevers, chills, nausea, vomiting, or diarrhea. With that being said he does have an appointment with infectious disease tomorrow and his primary care on Wednesday. Again the reason for the infectious disease referral was due to the fact that he did not seem to be fully resolving with the use of oral antibiotics and therefore we were thinking that IV antibiotic therapy may be necessary secondary to the fact that there was an intermediate finding for how  effective the Cipro may be. Nonetheless again he has been having a lot of purulent and even green drainage. Fortunately right now that seems to have calmed down over the past week with the reinitiation of the oral antibiotic. Nonetheless we will see what Dr. Megan Salon has to say. 07/12/2019 upon evaluation today patient  appears to be doing about the same at this point in regard to his left lower extremity ulcer. Fortunately there is no signs of active infection at this time which is good news I do believe the Levaquin has been beneficial I did review Dr. Hale Bogus note and to be honest I agree that the patient's leg does appear to be doing better currently. What we found in the past as he does not seem to really completely resolve he will stop the antibiotic and then subsequently things will revert back to having issues with blue-green drainage, increased pain, and overall worsening in general. Obviously that is the reason I sent him back to infectious disease. 07/19/2019 upon evaluation today patient appears to be doing roughly the same in size there is really no dramatic improvement. He has started back on the Levaquin at this point and though he seems to be doing okay he did still have a lot of blue/green drainage noted on evaluation today unfortunately. I think that this is still indicative more likely of a Pseudomonas infection as previously noted and again he does see Dr. Megan Salon in just a couple of days. I do not know that were really able to effectively clear this with just oral antibiotics alone based on what I am seeing currently. Nonetheless we are still continue to try to manage as best we can with regard to the patient and his wound. I do think the wrap was helpful in decreasing the edema which is excellent news. No fevers, chills, nausea, vomiting, or diarrhea. 07/26/2019 upon evaluation today patient appears to be doing slightly better with regard to the overall appearance of the muscle  there is no dark discoloration centrally. Fortunately there is no signs of active infection at this time. No fevers, chills, nausea, vomiting, or diarrhea. Patient's wound bed currently the patient did have an appointment with Dr. Megan Salon at infectious disease last week. With that being said Dr. Megan Salon the patient states was still somewhat hesitant about put him on any IV antibiotics he wanted [place] to repeat cultures today and then see where things go going forward. He does look like Dr. Megan Salon because of some improvement the patient did have with the Levaquin wanted [place] to see about repeating cultures. If it indeed grows the Pseudomonas again then he recommended a possibility of considering a PICC line placement and IV antibiotic therapy. He plans to see the patient back in 1 to 2 weeks. 08/02/2019 upon evaluation today patient appears to be doing poorly with regard to his left lower extremity. We did get the results of his culture back it shows that he is still showing evidence of Pseudomonas which is consistent with the purulent/blue-green drainage that he has currently. Subsequently the culture also shows that he now is showing resistance to the oral fluoroquinolones which is unfortunate as that was really the only thing to treat the infection prior. I do believe that he is looking like this is going require IV antibiotic therapy to get this under control. Fortunately there is no signs of systemic infection at this time which is good news. The patient does see Dr. Megan Salon tomorrow. 08/09/2019 upon evaluation today patient appears to be doing better with regard to his left lower extremity ulcer in regard to the overall appearance. He is currently on IV antibiotic therapy. As ordered by Dr. Megan Salon. Currently the patient is on ceftazidime which she is going to take for the next 2 weeks and then follow-up for 4 to 5-week appointment  with Dr. Megan Salon. The patient started this this past Friday  symptoms have not for a total of 3 days currently in full. Electronic Signature(s) Signed: 08/09/2019 4:09:35 PM By: Worthy Keeler PA-C Entered By: Worthy Keeler on 08/09/2019 16:09:35 Kosak, Harvel ([id number]) NIKALAS, BRAMEL ([id number]) -------------------------------------------------------------------------------- Physical Exam Details Patient Name: [name] Date of Service: 08/09/2019 8:15 AM Medical Record Number: [id number] Patient Account Number: [id number] Date of Birth/Sex: Sep 19, 1980 (39 y.o. M) Treating RN: Army Melia Primary Care Provider: [name] Other Clinician: Referring Provider: [name] Treating Provider/Extender: Melburn Hake, Dreyah Montrose Weeks in Treatment: 53 Constitutional Well-nourished and well-hydrated in no acute distress. Respiratory normal breathing without difficulty. Psychiatric this patient is able to make decisions and demonstrates good insight into disease process. Alert and Oriented x 3. pleasant and cooperative. Notes Upon inspection patient's wound again is about the same size wise although he has much less drainage noted the last week already and that is after just a few days of treatment with the IV antibiotics. No debridement was performed today he still does have discomfort but overall I feel like things are already looking better which is great news. Electronic Signature(s) Signed: 08/09/2019 4:10:17 PM By: Worthy Keeler PA-C Previous Signature: 08/09/2019 4:09:58 PM Version By: Worthy Keeler PA-C Entered By: Worthy Keeler on 08/09/2019 16:10:16 [name] ([id number]) -------------------------------------------------------------------------------- Physician Orders Details Patient Name: [name] Date of Service: 08/09/2019 8:15 AM Medical Record Number: [id number] Patient Account Number: [id number] Date of Birth/Sex: [birth year] (39 y.o. 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 Anesthetic (add to Medication List) Wound #1 Left,Lateral Lower Leg o Topical Lidocaine 4% cream applied to wound bed prior to debridement (In Clinic Only). Primary Wound Dressing o Alginate Secondary Dressing Wound #1 Left,Lateral Lower Leg o ABD and Kerlix/Conform Dressing Change Frequency Wound #1 Left,Lateral Lower Leg o Change dressing every day. Follow-up Appointments Wound #1 Left,Lateral Lower Leg o Return Appointment in 1 week. Edema Control Wound #1 Left,Lateral Lower Leg o Elevate legs to the level of the heart and pump ankles as often as possible - Tubi grip F Electronic Signature(s) Signed: 08/09/2019 4:14:01 PM By: Army Melia Signed: 08/09/2019 4:32:11 PM By: Worthy Keeler PA-C Entered By: Army Melia on 08/09/2019 08:51:13 [name] ([id number]) -------------------------------------------------------------------------------- Problem List Details Patient Name: [name] Date of Service: 08/09/2019 8:15 AM Medical Record Number: [id number] Patient Account Number: [id number] Date of Birth/Sex: Oct 07, 1980 (39 y.o. M) Treating RN: Army Melia Primary Care Provider: [name] Other Clinician: Referring Provider: [name] Treating Provider/Extender: Melburn Hake, Keanna [name] Weeks in Treatment: 139 Active Problems ICD-10 Evaluated Encounter Code Description Active Date Today Diagnosis L97.222 Non-pressure chronic ulcer of left calf with fat layer exposed 12/04/2016 No Yes L88  Pyoderma gangrenosum 03/26/2017 No Yes I87.2 Venous insufficiency (chronic) (peripheral) 12/04/2016 No Yes L03.116 Cellulitis of left lower limb 05/24/2019 No Yes Inactive Problems ICD-10 Code Description Active Date Inactive Date L97.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 M) Treating RN: Army Melia Primary Care Provider: [name] Other Clinician: Referring Provider: [name] Treating Provider/Extender: Melburn Hake, Chanika Byland Weeks in Treatment: 139 Subjective Chief Complaint Information obtained from Patient He is here in follow up evaluation for LLE pyoderma ulcer History of Present Illness (HPI) 12/04/16; 39 year old man who comes into the clinic today for review of a wound on the posterior left calf. He tells me that is been there for about a year. He is not a diabetic he does smoke half a pack per day. He was seen in the ER on 11/20/16 felt to have cellulitis around the wound and was given clindamycin. An x-ray did not show osteomyelitis. The patient initially tells me that he has a milk allergy that sets off a pruritic itching rash on his lower legs which she scratches incessantly and he thinks that's what may have set up the wound. He has been using various topical antibiotics and ointments without any effect. He works in a [employer] and is on his feet all day. He does not have a prior history of wounds however he does have the rash on both lower legs the right arm and the ventral  aspect of his left arm. These are excoriations and clearly have had scratching however there are of macular looking areas on both legs including a substantial larger area on the right leg. This does not have an underlying open area. There is no blistering. The patient tells me that 2 years ago in [place] in response to the rash on his legs he saw a dermatologist who told him he had a condition which may be pyoderma gangrenosum although I may be putting words into his mouth. He seemed to recognize this. 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 He is not in any treatment for this. He's had no recent travel 12/11/16; the patient arrives today with his wound and roughly the same condition we've been using silver alginate this is a deep punched out wound with some surrounding erythema but no tenderness. Biopsy I did did not show confirmed pyoderma gangrenosum suggested nonspecific inflammation and vasculitis but does not provide an actual description of what was seen by the pathologist. I'm really not able to understand this We have also received information from the patient's dermatologist in [place] notes from April 2016. This was a doctor Agarwal-antal. 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 He was prescribed topical steroid high potency under occlusion which helped but at this point the patient did not have a deep punched out wound. 12/18/16; the patient's wound is larger in terms of surface area however this surface looks better and there is less depth. The surrounding erythema also is better. The patient states that the wrap we put on came off 2 days ago when he has been using his compression stockings. He we are in the process of getting a dermatology consult. 12/26/16 on evaluation today patient's left lower extremity wound shows evidence of infection with surrounding erythema noted. He has been tolerating the dressing changes but states that  he has noted more discomfort. There is a larger area of erythema surrounding the wound. No fevers, chills, nausea, or vomiting noted at this time. With that being said the wound still does have slough covering  the surface. He is not allergic to any medication that he is aware of at this point. In regard to his right lower extremity he had several regions that are erythematous and pruritic he wonders if there's anything we can do to help that. 01/02/17 I reviewed patient's wound culture which was obtained his visit last week. He was placed on doxycycline at that point. Unfortunately that does not appear to be an antibiotic that would likely help with the situation however the pseudomonas noted on culture is sensitive to Cipro. Also unfortunately patient's wound seems to have a large compared to last week's evaluation. Not severely so but there are definitely increased measurements in general. He is continuing to have discomfort as well he writes this to be a seven out of 10. In fact he would prefer me not to perform any debridement today due to the fact that he is having discomfort and considering he has an active infection on the little reluctant to do so anyway. No fevers, chills, nausea, or vomiting noted at this time. 01/08/17; patient seems dermatology on September 5. I suspect dermatology will want the slides from the biopsy I did sent to their pathologist. I'm not sure if there is a way we can expedite that. In any case the culture I did before I left on vacation 3 weeks ago showed Pseudomonas he was given 10 days of Cipro and per her description of her intake nurses is actually somewhat better this week although the wound is quite a bit bigger than I remember the last time I saw this. He still has 3 more days of Cipro 01/21/17; dermatology appointment tomorrow. He has completed the ciprofloxacin for Pseudomonas. Surface of the wound looks better however he is had some deterioration in the lesions  on his right leg. Meantime the left lateral leg wound we will continue with sample 01/29/17; patient had his dermatology appointment but I can't yet see that note. He is completed his antibiotics. The wound is more superficial but considerably larger in circumferential area than when he came in. This is in his left lateral calf. He also has swollen erythematous areas with superficial wounds on the right leg and small papular areas on both arms. There apparently areas in her his upper thighs and buttocks I did not look at those. Dermatology biopsied the right leg. Hopefully will have their input next week. 02/05/17; patient went back to see his dermatologist who told him that he had a "scratching problem" as well as staph. He is now on a 30 day course of doxycycline and I believe she gave him triamcinolone cream to the right leg areas to help with the itching [not exactly sure but probably triamcinolone]. She apparently looked at the left lateral leg wound although this was not rebiopsied and I think felt to be ultimately part of the same pathogenesis. He is using sample border foam and changing nevus himself. He now has a new open area on the right posterior leg which was his biopsy site I don't have any of the dermatology notes LOEL, BETANCUR ([id number]) 02/12/17; we put the patient in compression last week with SANTYL to the wound on the left leg and the biopsy. Edema is much better and the depth of the wound is now at level of skin. Area is still the same Biopsy site on the right lateral leg we've also been using santyl with a border foam dressing and he is changing this himself. 02/19/17; Using silver alginate started last week  to both the substantial left leg wound and the biopsy site on the right wound. He is tolerating compression well. Has a an appointment with his primary M.D. tomorrow wondering about diuretics although I'm wondering if the edema problem is actually lymphedema 02/26/17; the  patient has been to see his primary doctor Dr. Jerrel Ivory at [facility] our primary care. She started him on Lasix 20 mg and this seems to have helped with the edema. However we are not making substantial change with the left lateral calf wound and inflammation. The biopsy site on the right leg also looks stable but not really all that different. 03/12/17; the patient has been to see vein and vascular Dr. Lucky Cowboy. He has had venous reflux studies I have not reviewed these. I did get a call from his dermatology office. 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 They therefore recommended clobetasol and tetracycline i.e. 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 03/26/17; apparently the patient just had reflux studies not an appointment with Dr. dew. She arrives in clinic today having applied clobetasol for 2-3 weeks. He notes over the last 2-3 days excessive drainage having to change the dressing 3-4 times a day and also expanding erythema. He states the expanding erythema seems to come and go and was last this red was earlier in the month.he is on doxycycline 150 mg twice a day as an anti- inflammatory systemic therapy for possible pyoderma gangrenosum along with the topical clobetasol 04/02/17; the patient was seen last week by Dr. Lillia Carmel at [facility] dermatology locally who kindly saw him at my request. 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 My concern was the degree of erythema medially extending from his left leg wound which was either inflammation from pyoderma or cellulitis. I put him on Augmentin however culture of the wound showed Pseudomonas which is  quinolone sensitive. I really don't believe he has cellulitis however in view of everything I will continue and give him a course of Cipro. He is also on doxycycline as an immune modulator for the pyoderma. In addition to his original wound on the left lateral leg with surrounding erythema he has a wound on the right posterior calf which was an original biopsy site done by dermatology. 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 Saw Dr. Lillia Carmel yesterday. He has been using topical antibiotics to both wound areas his original wound on the left and the biopsies/pathergy area on the right. There is definitely some improvement in the inflammation around the wound on the right although the patient states he has increasing sensitivity of the wounds. He is on prednisone 60 and doxycycline 1 as prescribed by Dr. Lillia Carmel. He is covering the topical antibiotic with gauze and putting this in his own compression stocks and changing this daily. He states that Dr. Lottie Rater did a culture of the left leg wound yesterday 05/07/17; pyoderma gangrenosum. The patient saw Dr. Lillia Carmel yesterday and has a follow-up with her in one month. He is still using topical antibiotics to both wounds although he can't recall exactly what type. He is still on prednisone 60 mg. Dr. Lillia Carmel stated that the doxycycline could  stop if we were in agreement. 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 The right medial leg was induced by biopsy/pathergy. The area on the right is essentially healed. 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 The right medial leg has closed and remains closed. He is still on prednisone 60. He tells me he missed his last dermatology appointment with Dr.  Lillia Carmel but will make another appointment. He reports that her blood sugar at a recent screen in [place] was high 200's. He was 180 today. 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 I think he is going to require still much longer prednisone perhaps another 3 months before attempting to taper. In the meantime his wound is a lot better. Smaller. He is cleaning this off daily and applying topical antibiotics. When he was last in the clinic I thought about changing to [facility] and actually put in a couple of calls to dermatology although probably not during their business hours. 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 He continues on oral prednisone. He has been using triple antibiotic ointment. There is surface debris and we will transition to [facility] and have him return in 2 weeks. He has lost 30 pounds since his last appointment with lifestyle modification. He may benefit from topical steroid cream for treatment this can be considered at a later date. 08/22/17 on evaluation today patient appears to actually be doing rather well in regard to his left lateral lower extremity ulcer. He has actually been managed by Dr. Dellia Nims most recently. Patient is currently on oral steroids at this time. This seems to have been of benefit for him. Nonetheless his last visit was actually with Leah on 08/06/17. Currently he is not utilizing any topical steroid creams although this could be of benefit as well. No fevers, chills, nausea, or vomiting noted at this time. 09/05/17 on evaluation today patient appears to be doing better in regard to his left lateral lower extremity ulcer. He has been tolerating the dressing changes without complication. He is using Santyl with good effect. Overall I'm very pleased with how things are standing at this point. Patient likewise is happy that this  is doing better. 09/19/17 on evaluation today patient actually appears to be doing rather well in regard to his left lateral lower extremity ulcer. 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 He's not having any significant pain. 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 He has been tolerating the Santyl without complication and in general I feel like he's making good progress. 10/17/17 on evaluation today patient appears to be doing very well in regard to his left lateral lower surety ulcer. He has been tolerating the dressing changes without complication. There does not appear to be any evidence of infection he's alternating the Santyl and the triple antibiotic ointment every other day this seems to be doing well for him. 11/03/17 on evaluation today patient appears to be doing very well in regard to his left lateral lower extremity ulcer.  He is been tolerating the dressing changes without complication which is good news. Fortunately there does not appear to be any evidence of infection which is also great news. Overall is doing excellent they are starting to taper down on the prednisone is down to 40 mg at this point it also started topical clobetasol for him. 11/17/17 on evaluation today patient appears to be doing well in regard to his left lateral lower surety ulcer. He's been tolerating the dressing changes without complication. He does note that he is having no pain, no excessive drainage or discharge, and overall he feels like things are going about how he would expect and hope they would. Overall he seems to have no evidence of infection at this time in my opinion which is good news. GYAN, CAMBRE ([id number]) 12/04/17-He is seen in follow-up evaluation for right lateral lower extremity ulcer. He has been applying  topical steroid cream. Today's measurement show slight increase in size. Over the next 2 weeks we will transition to every other day Santyl and steroid cream. He has been encouraged to monitor for changes and notify clinic with any concerns 12/15/17 on evaluation today patient's left lateral motion the ulcer and fortunately is doing worse again at this point. This just since last week to this week has close to doubled in size according to the patient. I did not seeing last week's I do not have a visual to compare this to in our system was also down so we do not have all the charts and at this point. Nonetheless it does have me somewhat concerned in regard to the fact that again he was worried enough about it he has contact the dermatology that placed them back on the full strength, 50 mg a day of the prednisone that he was taken previous. He continues to alternate using clobetasol along with Santyl at this point. He is obviously somewhat frustrated. 12/22/17 on evaluation today patient appears to be doing a little worse compared to last evaluation. Unfortunately the wound is a little deeper and slightly larger than the last week's evaluation. With that being said he has made some progress in regard to the irritation surrounding at this time unfortunately despite that progress that's been made he still has a significant issue going on here. 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 For that reason I discussed with him today the possibility of perform a wound culture to ensure there's nothing overtly infected. 01/06/18 on evaluation today patient's wound is larger and deeper than previously evaluated. With that being said it did appear that his wound was infected after my last evaluation with him. Subsequently I did end up prescribing a prescription for  Bactrim DS which she has been taking and having no complication with. Fortunately there does not appear to be any evidence of infection at this point in time as far as anything spreading, no want to touch, and overall I feel like things are showing signs of improvement. 01/13/18 on evaluation today patient appears to be even a little larger and deeper than last time. There still muscle exposed in the base of the wound. Nonetheless he does appear to be less erythematous I do believe inflammation is calming down also believe the infection looks like it's probably resolved at this time based on what I'm seeing. No fevers, chills, nausea, or vomiting noted at this  time. 01/30/18 on evaluation today patient actually appears to visually look better for the most part. Unfortunately those visually this looks better he does seem to potentially have what may be an abscess in the muscle that has been noted in the central portion of the wound. This is the first time that I have noted what appears to be fluctuance in the central portion of the muscle. With that being said I'm somewhat more concerned about the fact that this might indicate an abscess formation at this location. I do believe that an ultrasound would be appropriate. This is likely something we need to try to do as soon as possible. He has been switch to mupirocin ointment and he is no longer using the steroid ointment as prescribed by dermatology he sees them again next week he's been decreased from 60 to 40 mg of prednisone. 03/09/18 on evaluation today patient actually appears to be doing a little better compared to last time I saw him. There's not as much erythema surrounding the wound itself. He I did review his most recent infectious disease note which was dated 02/24/18. He saw Dr. Michel Bickers in [place]. 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 Patient  is now off of antibiotics and they are continually observing this. There seems to be no change in the past two weeks in my pinion based on what the patient says and what I see today compared to what Dr. Megan Salon likely saw two weeks ago. No fevers, chills, nausea, or vomiting noted at this time. 03/23/18 on evaluation today patient's wound actually appears to be showing signs of improvement which is good news. He is currently still on the Dapsone. He is also working on tapering the prednisone to get off of this and Dr. Lottie Rater is working with him in this regard. Nonetheless overall I feel like the wound is doing well it does appear based on the infectious disease note that I reviewed from Dr. 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 I did also review the results of his ultrasound of left lower extremity which revealed there was a dentist tissue in the base of the wound without an abscess noted. 04/06/18 on evaluation today the patient's left lateral lower extremity ulcer actually appears to be doing fairly well which is excellent news. There does not appear to be any evidence of infection at this time which is also great news. Overall he still does have a significantly large ulceration although little by little he seems to be making progress. He is down to 10 mg a day of the prednisone. 04/20/18 on evaluation today patient actually appears to be doing excellent at this time in regard to his left lower extremity ulcer. He's making signs of good progress unfortunately this is taking much longer than we would really like to see but nonetheless he is making progress. Fortunately there does not appear to be any evidence of infection at this time. No fevers, chills, nausea, or vomiting noted at this time. The patient has not been using the Santyl due to the cost he hadn't got in this field  yet. He's mainly been using the antibiotic ointment topically. Subsequently he also tells me that he really has not been scrubbing in the shower I think this would be  helpful again as I told him it doesn't have to be anything too aggressive to even make it believe just enough to keep it free of some of the loose slough and biofilm on the wound surface. 05/11/18 on evaluation today patient's wound appears to be making slow but sure progress in regard to the left lateral lower extremity ulcer. He is been tolerating the dressing changes without complication. Fortunately there does not appear to be any evidence of infection at this time. He is still just using triple antibiotic ointment along with clobetasol occasionally over the area. He never got the Santyl and really does not seem to intend to in my pinion. 06/01/18 on evaluation today patient appears to be doing a little better in regard to his left lateral lower extremity ulcer. He states that overall he does not feel like he is doing as well with the Dapsone as he did with the prednisone. Nonetheless he sees his dermatologist later today and is gonna talk to them about the possibility of going back on the prednisone. Overall again I believe that the wound would be better if you would utilize Santyl but he really does not seem to be interested in going back to the [place] at this point. He has been using triple antibiotic ointment. 06/15/18 on evaluation today patient's wound actually appears to be doing about the same at this point. Fortunately there is no signs of infection at this time. He has made slight improvements although he continues to not really want to clean the wound bed at this point. He states that he just doesn't mess with it he doesn't want to cause any problems with everything else he has going on. He has been on medication, antibiotics as prescribed by his dermatologist, for a staff infection of his lower extremities which is really  drying out now and looking much better he tells me. Fortunately there is no sign of overall infection. 06/29/18 on evaluation today patient appears to be doing well in regard to his left lateral lower surety ulcer all things considering. Fortunately his staff infection seems to be greatly improved compared to previous. He has no signs of infection and this is drying up quite nicely. He is still the doxycycline Valin, Abbott ([id number]) for this is no longer on cental, Dapsone, or any of the other medications. His dermatologist has recommended possibility of an infusion but right now he does not want to proceed with that. 07/13/18 on evaluation today patient appears to be doing about the same in regard to his left lateral lower surety ulcer. Fortunately there's no signs of infection at this time which is great news. Unfortunately he still builds up a significant amount of Slough/biofilm of the surface of the wound he still is not really cleaning this as he should be appropriately. Again I'm able to easily with saline and gauze remove the majority of this on the surface which if you would do this at home would likely be a dramatic improvement for him as far as getting the area to improve. Nonetheless overall I still feel like he is making progress is just very slow. I think Santyl will be of benefit for him as well. Still he has not gotten this as of this point. 07/27/18 on evaluation today patient actually appears to be doing little worse in regards of the erythema around the periwound region of the wound he also tells me that he's been having more drainage currently compared to what he was experiencing last time  I saw him. He states not quite as bad as what he had because this was infected previously but nonetheless is still appears to be doing poorly. Fortunately there is no evidence of systemic infection at this point. The patient tells me that he is not going to be able to afford the Santyl. He is  still waiting to hear about the infusion therapy with his dermatologist. Apparently she wants an updated colonoscopy first. 08/10/18 on evaluation today patient appears to be doing better in regard to his left lateral lower extremity ulcer. Fortunately he is showing signs of improvement in this regard he's actually been approved for Remicade infusion's as well although this has not been scheduled as of yet. Fortunately there's no signs of active infection at this time in regard to the wound although he is having some issues with infection of the right lower extremity is been seen as dermatologist for this. Fortunately they are definitely still working with him trying to keep things under control. 09/07/18 on evaluation today patient is actually doing rather well in regard to his left lateral lower extremity ulcer. He notes these actually having some hair grow back on his extremity which is something he has not seen in years. He also tells me that the pain is really not giving them any trouble at this time which is also good news overall she is very pleased with the progress he's using a combination of the mupirocin along with the probate is all mixed. 09/21/18 on evaluation today patient actually appears to be doing fairly well all things considered in regard to his looks from the ulcer. He's been tolerating the dressing changes without complication. Fortunately there's no signs of active infection at this time which is good news he is still on all antibiotics or prevention of the staff infection. He has been on prednisone for time although he states it is gonna contact his dermatologist and see if she put them on a short course due to some irritation that he has going on currently. Fortunately there's no evidence of any overall worsening this is going very slow I think cental would be something that would be helpful for him although he states that $50 for tube is quite expensive. He therefore is not  willing to get that at this point. 10/06/18 on evaluation today patient actually appears to be doing decently well in regard to his left lateral leg ulcer. He's been tolerating the dressing changes without complication. Fortunately there's no signs of active infection at this time. Overall I'm actually rather pleased with the progress he's making although it's slow he doesn't show any signs of infection and he does seem to be making some improvement. I do believe that he may need a switch up and dressings to try to help this to heal more appropriately and quickly. 10/19/18 on evaluation today patient actually appears to be doing better in regard to his left lateral lower extremity ulcer. This is shown signs of having much less Slough buildup at this point due to the fact he has been using the [employer]. Obviously this is very good news. The overall size of the wound is not dramatically smaller but again the appearance is. 11/02/18 on evaluation today patient actually appears to be doing quite well in regard to his lower [place] ulcer. A lot of the skin around the ulcer is actually somewhat irritating at this point this seems to be more due to the dressing causing irritation from the adhesive that anything else.  Fortunately there is no signs of active infection at this time. 11/24/18 on evaluation today patient appears to be doing a little worse in regard to his overall appearance of his lower extremity ulcer. There's more erythema and warmth around the wound unfortunately. He is currently on doxycycline which he has been on for some time. With that being said I'm not sure that seems to be helping with what appears to possibly be an acute cellulitis with regard to his left lower extremity ulcer. No fevers, chills, nausea, or vomiting noted at this time. 12/08/18 on evaluation today patient's wounds actually appears to be doing significantly better compared to his last evaluation. He has been using Santyl along  with alternating tripling about appointment as well as the steroid cream seems to be doing quite well and the wound is showing signs of improvement which is excellent news. Fortunately there's no evidence of infection and in fact his culture came back negative with only normal skin flora noted. 12/21/2018 upon evaluation today patient actually appears to be doing excellent with regard to his ulcer. This is actually the best that I have seen it since have been helping to take care of him. It is both smaller as well as less slough noted on the surface of the wound and seems to be showing signs of good improvement with new skin growing from the edges. He has been using just the triamcinolone he does wonder if he can get a refill of that ointment today. 01/04/2019 upon evaluation today patient actually appears to be doing well with regard to his left lateral lower extremity ulcer. With that being said it does not appear to be that he is doing quite as well as last time as far as progression is concerned. There does not appear to be any signs of infection or significant irritation which is good news. With that being said I do believe that he may benefit from switching to a collagen based dressing based on how clean The wound appears. 01/18/2019 on evaluation today patient actually appears to be doing well with regard to his wound on the left lower extremity. He is not made a lot of progress compared to where we were previous but nonetheless does seem to be doing okay at this time which is good news. There is no signs of active infection which is also good news. My only concern currently is I do wish we can get him into utilizing the collagen dressing his insurance would not pay for the supplies that we ordered although it appears that he may be able to order this through his supply company that he typically utilizes. This is [employer]. Nonetheless he did try to order it during the office visit today and it  appears this did go through. We will see if he can get that it is a different brand but nonetheless he has collagen and I do think will be beneficial. BRYOR, RAMI ([id number]) 02/01/2019 on evaluation today patient actually appears to be doing a little worse today in regard to the overall size of his wounds. Fortunately there is no signs of active infection at this time. That is visually. Nonetheless when this is happened before it was due to infection. For that reason were somewhat concerned about that this time as well. 02/08/2019 on evaluation today patient unfortunately appears to be doing slightly worse with regard to his wound upon evaluation today. Is measuring a little deeper and a little larger unfortunately. I am not really sure exactly  what is causing this to enlarge he actually did see his dermatologist she is going to see about initiating Humira for him. Subsequently she also did do steroid injections into the wound itself in the periphery. Nonetheless still nonetheless he seems to be getting a little bit larger he is gone back to just using the steroid cream topically which I think is appropriate. I would say hold off on the collagen for the time being is definitely a good thing to do. Based on the culture results which we finally did get the final result back regarding it shows staph as the bacteria noted again that can be a normal skin bacteria based on the fact however he is having increased drainage and worsening of the wound measurement wise I would go ahead and place him on an antibiotic today I do believe for this. 02/15/2019 on evaluation today patient actually appears to be doing somewhat better in regard to his ulcer. There is no signs of worsening at this time I did review his culture results which showed evidence of Staphylococcus aureus but not MRSA. Again this could just be more related to the normal skin bacteria although he states the drainage has slowed down quite a bit he  may have had a mild infection not just colonization. And was much smaller and then since around10/04/2019 on evaluation today patient appears to be doing unfortunately worse as far as the size of the wound. I really feel like that this is steadily getting larger again it had been doing excellent right at the beginning of September we have seen a steady increase in the area of the wound it is almost 2-1/2 times the size it was on September 1. Obviously this is a bad trend this is not wanting to see. For that reason we went back to using just the topical triamcinolone cream which does seem to help with inflammation. I checked him for bacteria by way of culture and nothing showed positive there. I am considering giving him a short course of a tapering steroid Dosepak today to see if that is can be beneficial for him. The patient is in agreement with giving that a try. 03/08/2019 on evaluation today patient appears to be doing very well in comparison to last evaluation with regard to his lower extremity ulcer. This is showing signs of less inflammation and actually measuring slightly smaller compared to last time every other week over the past month and a half he has been measuring larger larger larger. Nonetheless I do believe that the issue has been inflammation the prednisone does seem to have been beneficial for him which is good news. No fevers, chills, nausea, vomiting, or diarrhea. 03/22/2019 on evaluation today patient appears to be doing about the same with regard to his leg ulcer. He has been tolerating the dressing changes without complication. With that being said the wound seems to be mostly arrested at its current size but really is not making any progress except for when we prescribed the prednisone. He did show some signs of dropping as far as the overall size of the wound during that interval week. Nonetheless this is something he is not on long-term at this point and unfortunately I think  he is getting need either this or else the Humira which his dermatologist has discussed try to get approval for. With that being said he will be seeing his dermatologist on the 11th of this month that is November. 04/19/2019 on evaluation today patient appears to be  doing really about the same the wound is measuring slightly larger compared to last time I saw him. He has not been into the office since November 2 due to the fact that he unfortunately had Covid as that his entire family. He tells me that it was rough but they did pull-through and he seems to be doing much better. Fortunately there is no signs of active infection at this time. No fevers, chills, nausea, vomiting, or diarrhea. 05/10/2019 on evaluation today patient unfortunately appears to be doing significantly worse as compared to last time I saw him. He does tell me that he has had his first dose of Humira and actually is scheduled to get the next one in the upcoming week. With that being said he tells me also that in the past several days he has been having a lot of issues with green drainage she showed me a picture this is more blue-green in color. He is also been having issues with increased sloughy buildup and the wound does appear to be larger today. Obviously this is not the direction that we want everything to take based on the starting of his Humira. Nonetheless I think this is definitely a result of likely infection and to be honest I think this is probably Pseudomonas causing the infection based on what I am seeing. 05/24/2019 on evaluation today patient unfortunately appears to be doing significantly worse compared to his prior evaluation with me 2 weeks ago. I did review his culture results which showed that he does have Staph aureus as well as Pseudomonas noted on the culture. Nonetheless the Levaquin that I prescribed for him does not appear to have been appropriate and in fact he tells me he is no longer experiencing the  green drainage and discharge that he had at the last visit. Fortunately there is no signs of active infection at this time which is good news although the wound has significantly worsened it in fact is much deeper than it was previous. We have been utilizing up to this point triamcinolone ointment as the prescription topical of choice but at this time I really feel like that the wound is getting need to be packed in order to appropriately manage this due to the deeper nature of the wound. Therefore something along the lines of an alginate dressing may be more appropriate. 05/31/2019 upon inspection today patient's wound actually showed signs of doing poorly at this point. Unfortunately he just does not seem to be making any good progress despite what we have tried. He actually did go ahead and pick up the Cipro and start taking that as he was noticing more green drainage he had previously completed the Levaquin that I prescribed for him as well. Nonetheless he missed his appointment for the seventh last week on Wednesday with the wound care center and [facility] where his dermatologist referred him. Obviously I do think a second opinion would be helpful at this point especially in light of the fact that the patient seems to be doing so poorly despite the fact that we have tried everything that I really know how at this point. The only thing that ever seems to have helped him in the past is when he was on high doses of continual steroids that did seem to make a difference for him. Right now he is on immune modulating medication to try to help with the pyoderma but I am not sure that he is getting as much relief at this  point as he is previously obtained from the use of steroids. 06/07/2019 upon evaluation today patient unfortunately appears to be doing worse yet again with regard to his wound. In fact I am starting to question whether or not he may have a fluid pocket in the muscle at this point  based on the bulging and the soft appearance to the central portion of the muscle area. There is not anything draining from the muscle itself at this time which is good news but nonetheless the wound is expanding. I am not really seeing any results of the Humira as far as overall wound progression based on what I am seeing at this point. The patient has been referred for second opinion with regard to his wound to the [facility] wound care center by his dermatologist which I definitely am not in opposition to. Unfortunately we tried multiple dressings in the past including collagen, alginate, and at one point even Hydrofera Blue. With that being said he is never really used it for any significant amount of time due to the fact that he often complains of pain associated with these dressings and then will go back to either using the Santyl which she has done intermittently or more frequently the triamcinolone. He is also using his own compression stockings. We have wrapped him in the past but again that was something else that he really was not a big fan of. 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 He seemed to do much better when he was on the steroids although honestly I understand that the steroids are not necessarily the best medication to be on long-term obviously 06/14/2019 on evaluation today patient appears to be doing actually a little bit better with regard to the overall appearance with his leg. Unfortunately he does continue to have issues with what appears to be some fluid underneath the muscle although he did see the wound specialty center at [facility]  last week their main goals were to see about infusion therapy in place of the Humira as they feel like that is not quite strong enough. They also recommended that we continue with the treatment otherwise as we are they felt like that was appropriate and they are okay with him continuing to follow-up here with [place] in that regard. With that being said they are also sending him to the vein specialist there to see about vein stripping and if that would be of benefit for him. Subsequently they also did not really address whether or not an ultrasound of the muscle area to see if there is anything that needs to be addressed here would be appropriate or not. For that reason I discussed this with him last week I think we may proceed down that road at this point. 06/21/2019 upon evaluation today patient's wound actually appears to be doing slightly better compared to previous evaluations. I do believe that he has made a difference with regard to the progression here with the use of oral steroids. Again in the past has been the only thing that is really calm things down. He does tell me that from [facility] is gotten a  good news from there that there are no further vein stripping that is necessary at this point. I do not have that available for review today although the patient did relay this to me. He also did obtain and have the ultrasound of the wound completed which I did sign off on today. It does appear that there is no fluid collection under the muscle this is likely then just edematous tissue in general. That is also good news. Overall I still believe the inflammation is the main issue here. He did inquire about the possibility of a wound VAC again with the muscle protruding like it is I am not really sure whether the wound VAC is necessarily ideal or not. That is something we will have to consider although I do believe he may need compression wrapping to try to help with edema control which could potentially be of  benefit. 06/28/2019 on evaluation today patient appears to be doing slightly better measurement wise although this is not terribly smaller he least seems to be trending towards that direction. With that being said he still seems to have purulent drainage noted in the wound bed at this time. He has been on Levaquin followed by Cipro over the past month. Unfortunately he still seems to have some issues with active infection at this time. I did perform a culture last week in order to evaluate and see if indeed there was still anything going on. Subsequently the culture did come back showing Pseudomonas which is consistent with the drainage has been having which is blue-green in color. He also has had an odor that again was somewhat consistent with Pseudomonas as well. Long story short it appears that the culture showed an intermediate finding with regard to how well the Cipro will work for the Pseudomonas infection. Subsequently being that he does not seem to be clearing up and at best what we are doing is just keeping this at [place] I think he may need to see infectious disease to discuss IV antibiotic options. 07/05/2019 upon evaluation today patient appears to be doing okay in regard to his leg ulcer. He has been tolerating the dressing changes at this point without complication. Fortunately there is no signs of active infection at this time which is good news. No fevers, chills, nausea, vomiting, or diarrhea. With that being said he does have an appointment with infectious disease tomorrow and his primary care on Wednesday. Again the reason for the infectious disease referral was due to the fact that he did not seem to be fully resolving with the use of oral antibiotics and therefore we were thinking that IV antibiotic therapy may be necessary secondary to the fact that there was an intermediate finding for how effective the Cipro may be. Nonetheless again he has been having a lot of purulent and even green  drainage. Fortunately right now that seems to have calmed down over the past week with the reinitiation of the oral antibiotic. Nonetheless we will see what Dr. Megan Salon has to say. 07/12/2019 upon evaluation today patient appears to be doing about the same at this point in regard to his left lower extremity ulcer. Fortunately there is no signs of active infection at this time which is good news I do believe the Levaquin has been beneficial I did review Dr. Hale Bogus note and to be honest I agree that the patient's leg does appear to be doing better currently. What we found in the past as he does not seem to really  completely resolve he will stop the antibiotic and then subsequently things will revert back to having issues with blue-green drainage, increased pain, and overall worsening in general. Obviously that is the reason I sent him back to infectious disease. 07/19/2019 upon evaluation today patient appears to be doing roughly the same in size there is really no dramatic improvement. He has started back on the Levaquin at this point and though he seems to be doing okay he did still have a lot of blue/green drainage noted on evaluation today unfortunately. I think that this is still indicative more likely of a Pseudomonas infection as previously noted and again he does see Dr. Megan Salon in just a couple of days. I do not know that were really able to effectively clear this with just oral antibiotics alone based on what I am seeing currently. Nonetheless we are still continue to try to manage as best we can with regard to the patient and his wound. I do think the wrap was helpful in decreasing the edema which is excellent news. No fevers, chills, nausea, vomiting, or diarrhea. 07/26/2019 upon evaluation today patient appears to be doing slightly better with regard to the overall appearance of the muscle there is no dark discoloration centrally. Fortunately there is no signs of active infection at this  time. No fevers, chills, nausea, vomiting, or diarrhea. Patient's wound bed currently the patient did have an appointment with Dr. Megan Salon at infectious disease last week. With that being said Dr. Megan Salon the patient states was still somewhat hesitant about put him on any IV antibiotics he wanted [place] to repeat cultures today and then see where things go going forward. He does look like Dr. Megan Salon because of some improvement the patient did have with the Levaquin wanted [place] to see about repeating cultures. If it indeed grows the Pseudomonas again then he recommended a possibility of considering a PICC line placement and IV antibiotic therapy. He plans to see the patient back in 1 to 2 weeks. 08/02/2019 upon evaluation today patient appears to be doing poorly with regard to his left lower extremity. We did get the results of his culture back it shows that he is still showing evidence of Pseudomonas which is consistent with the purulent/blue-green drainage that he has currently. Subsequently the culture also shows that he now is showing resistance to the oral fluoroquinolones which is unfortunate as that was really the only thing to treat the infection prior. I do believe that he is looking like this is going require IV antibiotic therapy to get this under control. Fortunately there is no signs of systemic infection at this time which is good news. The patient does see Dr. Megan Salon tomorrow. 08/09/2019 upon evaluation today patient appears to be doing better with regard to his left lower extremity ulcer in regard to the overall appearance. He is currently on IV antibiotic therapy. As ordered by Dr. Megan Salon. Currently the patient is on ceftazidime which she is going to take for the next 2 weeks and then follow-up for 4 to 5-week appointment with Dr. Megan Salon. The patient started this this past Friday symptoms have not for a total of 3 days currently in full. TREW, SUNDE  ([id number]) Objective Constitutional Well-nourished and well-hydrated in no acute distress. Vitals Time Taken: 8:23 AM, Height: 71 in, Weight: 338 lbs, BMI: 47.1, Temperature: 99.0 F, Pulse: 80 bpm, Respiratory Rate: 16 breaths/min, Blood Pressure: 143/80 mmHg. Respiratory normal breathing without difficulty. Psychiatric this patient is able to make decisions and demonstrates  good insight into disease process. Alert and Oriented x 3. pleasant and cooperative. General Notes: Upon inspection patient's wound again is about the same size wise although he has much less drainage noted the last week already and that is after just a few days of treatment with the IV antibiotics. No debridement was performed today he still does have discomfort but overall I feel like things are already looking better which is great news. Integumentary (Hair, Skin) Wound #1 status is Open. Original cause of wound was Gradually Appeared. The wound is located on the Left,Lateral Lower Leg. The wound measures 6.4cm length x 6.5cm width x 0.6cm depth; 32.673cm^2 area and 19.604cm^3 volume. There is muscle and Fat Layer (Subcutaneous Tissue) Exposed exposed. There is no tunneling or undermining noted. There is a large amount of purulent drainage noted. The wound margin is epibole. There is small (1-33%) pink granulation within the wound bed. There is a large (67-100%) amount of necrotic tissue within the wound bed including Adherent Slough. 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 Anesthetic (add to Medication List): Wound #1 Left,Lateral Lower Leg: Topical Lidocaine 4% cream applied to wound bed prior to debridement (In Clinic Only). Primary Wound Dressing: Alginate Secondary  Dressing: Wound #1 Left,Lateral Lower Leg: ABD and Kerlix/Conform Dressing Change Frequency: Wound #1 Left,Lateral Lower Leg: Change dressing every day. VERON, SENNER ([id number]) Follow-up Appointments: Wound #1 Left,Lateral Lower Leg: Return Appointment in 1 week. Edema Control: Wound #1 Left,Lateral Lower Leg: Elevate legs to the level of the heart and pump ankles as often as possible - Tubi grip F 1. My suggestion currently is good to be for the patient to continue to take the IV antibiotic therapy as directed by Dr. Megan Salon for the next 2 weeks obviously I am hopeful that we will completely clear the infection that we have been struggling with for some time now. 2. I am also can recommend at this point that the patient continue with changing the dressing of his own accord at this time using the alginate I think that still probably best as opposed to [place] using the wrap so that he can change this more frequently his swelling was really not significantly different this week compared to last week with him using his compression at home. 3. I am to recommend as well he try to elevate his legs much as possible when he is not actively working in order to try to keep the edema under good control. We will see patient back for reevaluation in 1 week here in the clinic. If anything worsens or changes patient will contact our office for additional recommendations. Electronic Signature(s) Signed: 08/09/2019 4:11:09 PM By: Worthy Keeler PA-C Entered By: Worthy Keeler on 08/09/2019 16:11:08 HORRIS, SPEROS ([id number]) -------------------------------------------------------------------------------- SuperBill Details Patient Name: [name] Date of Service: 08/09/2019 Medical Record Number: [id number] Patient Account Number: [id number] Date of Birth/Sex: May 22, 1980 (39 y.o. M) Treating RN: Army Melia Primary Care Provider: [name] Other Clinician: Referring Provider: [name] Treating Provider/Extender: Melburn Hake, Donise Woodle Weeks in Treatment: 139 Diagnosis Coding ICD-10 Codes Code Description [phone] Non-pressure chronic ulcer of left calf with fat layer exposed L88 Pyoderma gangrenosum I87.2 Venous insufficiency (chronic) (peripheral) L03.116 Cellulitis of left lower limb Facility Procedures CPT4 Code: [id number] Description: 99213 - WOUND CARE VISIT-LEV 3 EST PT Modifier: Quantity:  1 Physician Procedures CPT4 Code: [id number] Description: 73750 - WC PHYS LEVEL 3 - EST PT Modifier: Quantity: 1 CPT4 Code: Description: ICD-10 Diagnosis Description L97.222 Non-pressure chronic ulcer of left calf with fat layer exposed L88 Pyoderma gangrenosum I87.2 Venous insufficiency (chronic) (peripheral) L03.116 Cellulitis of left lower limb Modifier: Quantity: Electronic Signature(s) Signed: 08/09/2019 4:11:40 PM By: Worthy Keeler PA-C Entered By: Worthy Keeler on 08/09/2019 16:11:40

## 2019-08-10 NOTE — Unmapped (Signed)
Called attempting to complete pre-rooming. [id number]

## 2019-08-14 ENCOUNTER — Other Ambulatory Visit (HOSPITAL_COMMUNITY)
Admission: RE | Admit: 2019-08-14 | Discharge: 2019-08-14 | Disposition: A | Discharge status: Home / Self Care | Payer: 59 | Source: Other Acute Inpatient Hospital | Attending: Internal Medicine | Admitting: Internal Medicine

## 2019-08-14 DIAGNOSIS — L97909 Non-pressure chronic ulcer of unspecified part of unspecified lower leg with unspecified severity: Secondary | ICD-10-CM | POA: Diagnosis not present

## 2019-08-14 LAB — BASIC METABOLIC PANEL
Anion gap: 9 (ref 5–15)
BUN: 12 mg/dL (ref 6–20)
CO2: 26 mmol/L (ref 22–32)
Calcium: 8.8 mg/dL — ABNORMAL LOW (ref 8.9–10.3)
Chloride: 103 mmol/L (ref 98–111)
Creatinine, Ser: 0.82 mg/dL (ref 0.61–1.24)
GFR calc Af Amer: >60 mL/min (ref >60)
GFR calc non Af Amer: >60 mL/min (ref >60)
Glucose, Bld: 248 mg/dL — ABNORMAL HIGH (ref 70–99)
Potassium: 4 mmol/L (ref 3.5–5.1)
Sodium: 138 mmol/L (ref 135–145)

## 2019-08-14 LAB — CBC WITH DIFFERENTIAL/PLATELET
Abs Immature Granulocytes: 0.05 10*3/uL (ref 0.00–0.07)
Basophils Absolute: 0 10*3/uL (ref 0.0–0.1)
Basophils Relative: 0 %
Eosinophils Absolute: 0.3 10*3/uL (ref 0.0–0.5)
Eosinophils Relative: 5 %
HCT: 42.7 % (ref 39.0–52.0)
Hemoglobin: 13.5 g/dL (ref 13.0–17.0)
Immature Granulocytes: 1 %
Lymphocytes Relative: 28 %
Lymphs Abs: 1.9 10*3/uL (ref 0.7–4.0)
MCH: 29.4 pg (ref 26.0–34.0)
MCHC: 31.6 g/dL (ref 30.0–36.0)
MCV: 93 fL (ref 80.0–100.0)
Monocytes Absolute: 0.5 10*3/uL (ref 0.1–1.0)
Monocytes Relative: 7 %
Neutro Abs: 4 10*3/uL (ref 1.7–7.7)
Neutrophils Relative %: 59 %
Platelets: 195 10*3/uL (ref 150–400)
RBC: 4.59 MIL/uL (ref 4.22–5.81)
RDW: 12.2 % (ref 11.5–15.5)
WBC: 6.8 10*3/uL (ref 4.0–10.5)
nRBC: 0 % (ref 0.0–0.2)

## 2019-08-15 ENCOUNTER — Other Ambulatory Visit: Payer: Self-pay | Admitting: Primary Care

## 2019-08-15 DIAGNOSIS — F330 Major depressive disorder, recurrent, mild: Secondary | ICD-10-CM

## 2019-08-16 ENCOUNTER — Encounter: Payer: 59 | Admitting: Physician Assistant

## 2019-08-16 ENCOUNTER — Other Ambulatory Visit: Payer: Self-pay

## 2019-08-16 DIAGNOSIS — L97222 Non-pressure chronic ulcer of left calf with fat layer exposed: Secondary | ICD-10-CM | POA: Diagnosis not present

## 2019-08-16 NOTE — Progress Notes (Addendum)
[name] ([id number]) Visit Report for 08/16/2019 Chief Complaint Document Details Patient Name: [name] Date of Service: 08/16/2019 8:15 AM Medical Record Number: [id number] Patient Account Number: [id number] Date of Birth/Sex: Oct 04, 1980 (39 y.o. M) Treating RN: Army Melia Primary Care Provider: [name] Other Clinician: Referring Provider: [name] Treating Provider/Extender: Melburn Hake, Randie Tallarico Weeks in Treatment: 140 Information Obtained from: Patient Chief Complaint He is here in follow up evaluation for LLE pyoderma ulcer Electronic Signature(s) Signed: 08/16/2019 8:29:36 AM By: Worthy Keeler PA-C Entered By: Worthy Keeler on 08/16/2019 08:29:35 [name], [name] ([id number]) -------------------------------------------------------------------------------- HPI Details Patient Name: [name] Date of Service: 08/16/2019 8:15 AM Medical Record Number: [id number] Patient Account Number: [id number] Date of Birth/Sex: 12/17/1980 (39 y.o. M) Treating RN: Army Melia Primary Care Provider: [name] Other Clinician: Referring Provider: [name] Treating Provider/Extender: Melburn Hake, Natavia Sublette Weeks in Treatment: 140 History of Present Illness HPI Description: 12/04/16; 39 year old man who comes into the clinic today for review of a wound on the posterior left calf. He tells me that is been there for about a year. He is not a diabetic he does smoke half a pack per day. He was seen in the ER on 11/20/16 felt to have cellulitis around the wound and was given clindamycin. An x-ray did not show osteomyelitis. The patient initially tells me that he has a milk allergy that sets off a pruritic itching rash on his lower legs which she scratches incessantly and he thinks that's what may have set up the wound. He has been using various topical antibiotics and ointments without any effect. He works in a [employer] and is on his feet all day. He does not have a prior  history of wounds however he does have the rash on both lower legs the right arm and the ventral aspect of his left arm. These are excoriations and clearly have had scratching however there are of macular looking areas on both legs including a substantial larger area on the right leg. This does not have an underlying open area. There is no blistering. The patient tells me that 2 years ago in [place] in response to the rash on his legs he saw a dermatologist who told him he had a condition which may be pyoderma gangrenosum although I may be putting words into his mouth. He seemed to recognize this. 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 He is not in any treatment for this. He's had no recent travel 12/11/16; the patient arrives today with his wound and roughly the same condition we've been using silver alginate this is a deep punched out wound with some surrounding erythema but no tenderness. Biopsy I did did not show confirmed pyoderma gangrenosum suggested nonspecific inflammation and vasculitis but does not provide an actual description of what was seen by the pathologist. I'm really not able to understand this We have also received information from the patient's dermatologist in [place] notes from April 2016. This was a doctor Agarwal-antal. 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 He was prescribed topical steroid high potency under occlusion which helped but at this point the patient did not have a deep punched out wound. 12/18/16; the patient's wound is larger in terms of surface area however this surface looks better and there is less depth. The surrounding erythema also is better. The patient states that the wrap we put on came off 2 days ago when he has been using his  compression stockings. He we are in the process of getting a dermatology consult. 12/26/16 on evaluation today patient's left lower extremity wound shows evidence of  infection with surrounding erythema noted. He has been tolerating the dressing changes but states that he has noted more discomfort. There is a larger area of erythema surrounding the wound. No fevers, chills, nausea, or vomiting noted at this time. With that being said the wound still does have slough covering the surface. He is not allergic to any medication that he is aware of at this point. In regard to his right lower extremity he had several regions that are erythematous and pruritic he wonders if there's anything we can do to help that. 01/02/17 I reviewed patient's wound culture which was obtained his visit last week. He was placed on doxycycline at that point. Unfortunately that does not appear to be an antibiotic that would likely help with the situation however the pseudomonas noted on culture is sensitive to Cipro. Also unfortunately patient's wound seems to have a large compared to last week's evaluation. Not severely so but there are definitely increased measurements in general. He is continuing to have discomfort as well he writes this to be a seven out of 10. In fact he would prefer me not to perform any debridement today due to the fact that he is having discomfort and considering he has an active infection on the little reluctant to do so anyway. No fevers, chills, nausea, or vomiting noted at this time. 01/08/17; patient seems dermatology on September 5. I suspect dermatology will want the slides from the biopsy I did sent to their pathologist. I'm not sure if there is a way we can expedite that. In any case the culture I did before I left on vacation 3 weeks ago showed Pseudomonas he was given 10 days of Cipro and per her description of her intake nurses is actually somewhat better this week although the wound is quite a bit bigger than I remember the last time I saw this. He still has 3 more days of Cipro 01/21/17; dermatology appointment tomorrow. He has completed the ciprofloxacin  for Pseudomonas. Surface of the wound looks better however he is had some deterioration in the lesions on his right leg. Meantime the left lateral leg wound we will continue with sample 01/29/17; patient had his dermatology appointment but I can't yet see that note. He is completed his antibiotics. The wound is more superficial but considerably larger in circumferential area than when he came in. This is in his left lateral calf. He also has swollen erythematous areas with superficial wounds on the right leg and small papular areas on both arms. There apparently areas in her his upper thighs and buttocks I did not look at those. Dermatology biopsied the right leg. Hopefully will have their input next week. 02/05/17; patient went back to see his dermatologist who told him that he had a "scratching problem" as well as staph. He is now on a 30 day course of doxycycline and I believe she gave him triamcinolone cream to the right leg areas to help with the itching [not exactly sure but probably triamcinolone]. She apparently looked at the left lateral leg wound although this was not rebiopsied and I think felt to be ultimately part of the same pathogenesis. He is using sample border foam and changing nevus himself. He now has a new open area on the right posterior leg which was his biopsy site I don't have any of the  dermatology notes 02/12/17; we put the patient in compression last week with SANTYL to the wound on the left leg and the biopsy. Edema is much better and the depth of the wound is now at level of skin. Area is still the same oBiopsy site on the right lateral leg we've also been using santyl with a border foam dressing and he is changing this himself. 02/19/17; Using silver alginate started last week to both the substantial left leg wound and the biopsy site on the right wound. He is tolerating compression well. Has a an appointment with his primary M.D. tomorrow wondering about diuretics although  I'm wondering if the edema problem is [name], [name] ([id number]) actually lymphedema 02/26/17; the patient has been to see his primary doctor Dr. Jerrel Ivory at [place] our primary care. She started him on Lasix 20 mg and this seems to have helped with the edema. However we are not making substantial change with the left lateral calf wound and inflammation. The biopsy site on the right leg also looks stable but not really all that different. 03/12/17; the patient has been to see vein and vascular Dr. Lucky Cowboy. He has had venous reflux studies I have not reviewed these. I did get a call from his dermatology office. They felt that he might have pathergy based on their biopsy on his right leg which led them to look at the slides of the biopsy I did on the left leg and they wonder whether this represents pyoderma gangrenosum which was the original supposition in a man with ulcerative colitis albeit inactive for [name] years. They therefore recommended clobetasol and tetracycline i.e. 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 03/26/17; apparently the patient just had reflux studies not an appointment with Dr. dew. She arrives in clinic today having applied clobetasol for 2-3 weeks. He notes over the last 2-3 days excessive drainage having to change the dressing 3-4 times a day and also expanding erythema. He states the expanding erythema seems to come and go and was last this red was earlier in the month.he is on doxycycline 150 mg twice a day as an anti- inflammatory systemic therapy for possible pyoderma gangrenosum along with the topical clobetasol 04/02/17; the patient was seen last week by Dr. Lillia Carmel at [facility] dermatology locally who kindly saw him at my request. 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 My concern was the degree of erythema medially extending from his left leg wound which was either inflammation from pyoderma  or cellulitis. I put him on Augmentin however culture of the wound showed Pseudomonas which is quinolone sensitive. I really don't believe he has cellulitis however in view of everything I will continue and give him a course of Cipro. He is also on doxycycline as an immune modulator for the pyoderma. In addition to his original wound on the left lateral leg with surrounding erythema he has a wound on the right posterior calf which was an original biopsy site done by dermatology. 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 Saw Dr. Lillia Carmel yesterday. He has been using topical antibiotics to both wound areas his original wound on the left and the biopsies/pathergy area on the right. There is definitely some improvement in the inflammation around the wound on the right although the patient states he has increasing sensitivity of the wounds. He is on prednisone 60 and doxycycline 1 as prescribed by Dr. Lillia Carmel. He is covering the topical  antibiotic with gauze and putting this in his own compression stocks and changing this daily. He states that Dr. Lottie Rater did a culture of the left leg wound yesterday 05/07/17; pyoderma gangrenosum. The patient saw Dr. Lillia Carmel yesterday and has a follow-up with her in one month. He is still using topical antibiotics to both wounds although he can't recall exactly what type. He is still on prednisone 60 mg. Dr. Lillia Carmel stated that the doxycycline could stop if we were in agreement. 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 The right medial leg was induced by biopsy/pathergy. The area on the right is essentially healed. 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 The right medial leg has closed and remains closed. He  is still on prednisone 60. oHe tells me he missed his last dermatology appointment with Dr. Lillia Carmel but will make another appointment. He reports that her blood sugar at a recent screen in [place] was high 200's. He was 180 today. 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 I think he is going to require still much longer prednisone perhaps another 3 months before attempting to taper. In the meantime his wound is a lot better. Smaller. He is cleaning this off daily and applying topical antibiotics. When he was last in the clinic I thought about changing to [name] and actually put in a couple of calls to dermatology although probably not during their business hours. 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 He continues on oral prednisone. He has been using triple antibiotic ointment. There is surface debris and we will transition to [facility] and have him return in 2 weeks. He has lost 30 pounds since his last appointment with lifestyle modification. He may benefit from topical steroid cream for treatment this can be considered at a later date. 08/22/17 on evaluation today patient appears to actually be doing rather well in regard to his left lateral lower extremity ulcer. He has actually been managed by Dr. Dellia Nims most recently. Patient is currently on oral steroids at this time. This seems to have been of benefit for him. Nonetheless his last visit was actually with Leah on 08/06/17. Currently he is not utilizing any topical steroid creams although this could be of benefit as well. No fevers, chills, nausea, or vomiting noted at this time. 09/05/17 on evaluation today patient appears to be doing better in regard to his left lateral lower extremity ulcer. He has been tolerating the dressing changes without complication. He is using Santyl with good effect. Overall I'm  very pleased with how things are standing at this point. Patient likewise is happy that this is doing better. 09/19/17 on evaluation today patient actually appears to be doing rather well in regard to his left lateral lower extremity ulcer. 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 He's not having any significant pain. 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 He has been tolerating the Santyl without complication and in general I feel like he's making good progress. 10/17/17 on  evaluation today patient appears to be doing very well in regard to his left lateral lower surety ulcer. He has been tolerating the dressing changes without complication. There does not appear to be any evidence of infection he's alternating the Santyl and the triple antibiotic ointment every other day this seems to be doing well for him. 11/03/17 on evaluation today patient appears to be doing very well in regard to his left lateral lower extremity ulcer. He is been tolerating the dressing changes without complication which is good news. Fortunately there does not appear to be any evidence of infection which is also great news. Overall is doing excellent they are starting to taper down on the prednisone is down to 40 mg at this point it also started topical clobetasol for him. 11/17/17 on evaluation today patient appears to be doing well in regard to his left lateral lower surety ulcer. He's been tolerating the dressing changes without complication. He does note that he is having no pain, no excessive drainage or discharge, and overall he feels like things are going about how he would expect and hope they would. Overall he seems to have no evidence of infection at this time in my opinion which is good news. 12/04/17-He is seen in follow-up  evaluation for right lateral lower extremity ulcer. He has been applying topical steroid cream. Today's measurement show slight increase in size. Over the next 2 weeks we will transition to every other day Santyl and steroid cream. He has been encouraged to monitor for changes and notify clinic with any concerns 12/15/17 on evaluation today patient's left lateral motion the ulcer and fortunately is doing worse again at this point. This just since last week to this [place] ([id number]) week has close to doubled in size according to the patient. I did not seeing last week's I do not have a visual to compare this to in our system was also down so we do not have all the charts and at this point. Nonetheless it does have me somewhat concerned in regard to the fact that again he was worried enough about it he has contact the dermatology that placed them back on the full strength, 50 mg a day of the prednisone that he was taken previous. He continues to alternate using clobetasol along with Santyl at this point. He is obviously somewhat frustrated. 12/22/17 on evaluation today patient appears to be doing a little worse compared to last evaluation. Unfortunately the wound is a little deeper and slightly larger than the last week's evaluation. With that being said he has made some progress in regard to the irritation surrounding at this time unfortunately despite that progress that's been made he still has a significant issue going on here. 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 For that reason I discussed with him today the possibility of perform a wound culture to ensure there's nothing overtly infected. 01/06/18 on evaluation today patient's wound is larger and deeper than previously evaluated. With that being said it did appear that his wound  was infected after my last evaluation with him. Subsequently I did end up prescribing a prescription for Bactrim DS which she has been taking and having no complication with. Fortunately there does not appear to be any evidence of infection at this point in time as far as anything spreading, no want to touch, and overall  I feel like things are showing signs of improvement. 01/13/18 on evaluation today patient appears to be even a little larger and deeper than last time. There still muscle exposed in the base of the wound. Nonetheless he does appear to be less erythematous I do believe inflammation is calming down also believe the infection looks like it's probably resolved at this time based on what I'm seeing. No fevers, chills, nausea, or vomiting noted at this time. 01/30/18 on evaluation today patient actually appears to visually look better for the most part. Unfortunately those visually this looks better he does seem to potentially have what may be an abscess in the muscle that has been noted in the central portion of the wound. This is the first time that I have noted what appears to be fluctuance in the central portion of the muscle. With that being said I'm somewhat more concerned about the fact that this might indicate an abscess formation at this location. I do believe that an ultrasound would be appropriate. This is likely something we need to try to do as soon as possible. He has been switch to mupirocin ointment and he is no longer using the steroid ointment as prescribed by dermatology he sees them again next week he's been decreased from 60 to 40 mg of prednisone. 03/09/18 on evaluation today patient actually appears to be doing a little better compared to last time I saw him. There's not as much erythema surrounding the wound itself. He I did review his most recent infectious disease note which was dated 02/24/18. He saw Dr. Michel Bickers in [place]. 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 Patient is now off of antibiotics and they are continually observing this. There seems to be no change in the past two weeks in my pinion based on what the patient says and what I see today compared to what Dr. Megan Salon likely saw two weeks ago. No fevers, chills, nausea, or vomiting noted at this time. 03/23/18 on evaluation today patient's wound actually appears to be showing signs of improvement which is good news. He is currently still on the Dapsone. He is also working on tapering the prednisone to get off of this and Dr. Lottie Rater is working with him in this regard. Nonetheless overall I feel like the wound is doing well it does appear based on the infectious disease note that I reviewed from Dr. 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 I did also review the results of his ultrasound of left lower extremity which revealed there was a dentist tissue in the base of the wound without an abscess noted. 04/06/18 on evaluation today the patient's left lateral lower extremity ulcer actually appears to be doing fairly well which is excellent news. There does not appear to be any evidence of infection at this time which is also great news. Overall he still does have a significantly large ulceration although little by little he seems to be making progress. He is down to 10 mg a day of the prednisone. 04/20/18 on evaluation today patient actually appears to be doing excellent at this time in regard to his left lower extremity ulcer. He's making signs of good progress unfortunately this is taking much longer than we would really like to see but  nonetheless he is making progress. Fortunately there does not appear to be any evidence of infection at this time. No fevers, chills, nausea, or vomiting noted  at this time. The patient has not been using the Santyl due to the cost he hadn't got in this field yet. He's mainly been using the antibiotic ointment topically. Subsequently he also tells me that he really has not been scrubbing in the shower I think this would be helpful again as I told him it doesn't have to be anything too aggressive to even make it believe just enough to keep it free of some of the loose slough and biofilm on the wound surface. 05/11/18 on evaluation today patient's wound appears to be making slow but sure progress in regard to the left lateral lower extremity ulcer. He is been tolerating the dressing changes without complication. Fortunately there does not appear to be any evidence of infection at this time. He is still just using triple antibiotic ointment along with clobetasol occasionally over the area. He never got the Santyl and really does not seem to intend to in my pinion. 06/01/18 on evaluation today patient appears to be doing a little better in regard to his left lateral lower extremity ulcer. He states that overall he does not feel like he is doing as well with the Dapsone as he did with the prednisone. Nonetheless he sees his dermatologist later today and is gonna talk to them about the possibility of going back on the prednisone. Overall again I believe that the wound would be better if you would utilize Santyl but he really does not seem to be interested in going back to the [place] at this point. He has been using triple antibiotic ointment. 06/15/18 on evaluation today patient's wound actually appears to be doing about the same at this point. Fortunately there is no signs of infection at this time. He has made slight improvements although he continues to not really want to clean the wound bed at this point. He states that he just doesn't mess with it he doesn't want to cause any problems with everything else he has going on. He has been on medication, antibiotics as  prescribed by his dermatologist, for a staff infection of his lower extremities which is really drying out now and looking much better he tells me. Fortunately there is no sign of overall infection. 06/29/18 on evaluation today patient appears to be doing well in regard to his left lateral lower surety ulcer all things considering. Fortunately his staff infection seems to be greatly improved compared to previous. He has no signs of infection and this is drying up quite nicely. He is still the doxycycline for this is no longer on cental, Dapsone, or any of the other medications. His dermatologist has recommended possibility of an infusion but right now he does not want to proceed with that. 07/13/18 on evaluation today patient appears to be doing about the same in regard to his left lateral lower surety ulcer. Fortunately there's no signs of infection at this time which is great news. Unfortunately he still builds up a significant amount of Slough/biofilm of the surface of the wound he still is not Treanor, Joey ([id number]) really cleaning this as he should be appropriately. Again I'm able to easily with saline and gauze remove the majority of this on the surface which if you would do this at home would likely be a dramatic improvement for him as far as getting the area to improve. Nonetheless  overall I still feel like he is making progress is just very slow. I think Santyl will be of benefit for him as well. Still he has not gotten this as of this point. 07/27/18 on evaluation today patient actually appears to be doing little worse in regards of the erythema around the periwound region of the wound he also tells me that he's been having more drainage currently compared to what he was experiencing last time I saw him. He states not quite as bad as what he had because this was infected previously but nonetheless is still appears to be doing poorly. Fortunately there is no evidence of systemic infection at  this point. The patient tells me that he is not going to be able to afford the Santyl. He is still waiting to hear about the infusion therapy with his dermatologist. Apparently she wants an updated colonoscopy first. 08/10/18 on evaluation today patient appears to be doing better in regard to his left lateral lower extremity ulcer. Fortunately he is showing signs of improvement in this regard he's actually been approved for Remicade infusion's as well although this has not been scheduled as of yet. Fortunately there's no signs of active infection at this time in regard to the wound although he is having some issues with infection of the right lower extremity is been seen as dermatologist for this. Fortunately they are definitely still working with him trying to keep things under control. 09/07/18 on evaluation today patient is actually doing rather well in regard to his left lateral lower extremity ulcer. He notes these actually having some hair grow back on his extremity which is something he has not seen in years. He also tells me that the pain is really not giving them any trouble at this time which is also good news overall she is very pleased with the progress he's using a combination of the mupirocin along with the probate is all mixed. 09/21/18 on evaluation today patient actually appears to be doing fairly well all things considered in regard to his looks from the ulcer. He's been tolerating the dressing changes without complication. Fortunately there's no signs of active infection at this time which is good news he is still on all antibiotics or prevention of the staff infection. He has been on prednisone for time although he states it is gonna contact his dermatologist and see if she put them on a short course due to some irritation that he has going on currently. Fortunately there's no evidence of any overall worsening this is going very slow I think cental would be something that would be  helpful for him although he states that $50 for tube is quite expensive. He therefore is not willing to get that at this point. 10/06/18 on evaluation today patient actually appears to be doing decently well in regard to his left lateral leg ulcer. He's been tolerating the dressing changes without complication. Fortunately there's no signs of active infection at this time. Overall I'm actually rather pleased with the progress he's making although it's slow he doesn't show any signs of infection and he does seem to be making some improvement. I do believe that he may need a switch up and dressings to try to help this to heal more appropriately and quickly. 10/19/18 on evaluation today patient actually appears to be doing better in regard to his left lateral lower extremity ulcer. This is shown signs of having much less Slough buildup at this point due to the fact he  has been using the Santyl. Obviously this is very good news. The overall size of the wound is not dramatically smaller but again the appearance is. 11/02/18 on evaluation today patient actually appears to be doing quite well in regard to his lower [place] ulcer. A lot of the skin around the ulcer is actually somewhat irritating at this point this seems to be more due to the dressing causing irritation from the adhesive that anything else. Fortunately there is no signs of active infection at this time. 11/24/18 on evaluation today patient appears to be doing a little worse in regard to his overall appearance of his lower extremity ulcer. There's more erythema and warmth around the wound unfortunately. He is currently on doxycycline which he has been on for some time. With that being said I'm not sure that seems to be helping with what appears to possibly be an acute cellulitis with regard to his left lower extremity ulcer. No fevers, chills, nausea, or vomiting noted at this time. 12/08/18 on evaluation today patient's wounds actually appears to  be doing significantly better compared to his last evaluation. He has been using Santyl along with alternating tripling about appointment as well as the steroid cream seems to be doing quite well and the wound is showing signs of improvement which is excellent news. Fortunately there's no evidence of infection and in fact his culture came back negative with only normal skin flora noted. 12/21/2018 upon evaluation today patient actually appears to be doing excellent with regard to his ulcer. This is actually the best that I have seen it since have been helping to take care of him. It is both smaller as well as less slough noted on the surface of the wound and seems to be showing signs of good improvement with new skin growing from the edges. He has been using just the triamcinolone he does wonder if he can get a refill of that ointment today. 01/04/2019 upon evaluation today patient actually appears to be doing well with regard to his left lateral lower extremity ulcer. With that being said it does not appear to be that he is doing quite as well as last time as far as progression is concerned. There does not appear to be any signs of infection or significant irritation which is good news. With that being said I do believe that he may benefit from switching to a collagen based dressing based on how clean The wound appears. 01/18/2019 on evaluation today patient actually appears to be doing well with regard to his wound on the left lower extremity. He is not made a lot of progress compared to where we were previous but nonetheless does seem to be doing okay at this time which is good news. There is no signs of active infection which is also good news. My only concern currently is I do wish we can get him into utilizing the collagen dressing his insurance would not pay for the supplies that we ordered although it appears that he may be able to order this through his supply company that he typically utilizes.  This is [employer]. Nonetheless he did try to order it during the office visit today and it appears this did go through. We will see if he can get that it is a different brand but nonetheless he has collagen and I do think will be beneficial. 02/01/2019 on evaluation today patient actually appears to be doing a little worse today in regard to the overall size of his  wounds. Fortunately there is no signs of active infection at this time. That is visually. Nonetheless when this is happened before it was due to infection. For that reason were somewhat concerned about that this time as well. 02/08/2019 on evaluation today patient unfortunately appears to be doing slightly worse with regard to his wound upon evaluation today. Is measuring a Kitamura, Hardeep ([id number]) little deeper and a little larger unfortunately. I am not really sure exactly what is causing this to enlarge he actually did see his dermatologist she is going to see about initiating Humira for him. Subsequently she also did do steroid injections into the wound itself in the periphery. Nonetheless still nonetheless he seems to be getting a little bit larger he is gone back to just using the steroid cream topically which I think is appropriate. I would say hold off on the collagen for the time being is definitely a good thing to do. Based on the culture results which we finally did get the final result back regarding it shows staph as the bacteria noted again that can be a normal skin bacteria based on the fact however he is having increased drainage and worsening of the wound measurement wise I would go ahead and place him on an antibiotic today I do believe for this. 02/15/2019 on evaluation today patient actually appears to be doing somewhat better in regard to his ulcer. There is no signs of worsening at this time I did review his culture results which showed evidence of Staphylococcus aureus but not MRSA. Again this could just be more related  to the normal skin bacteria although he states the drainage has slowed down quite a bit he may have had a mild infection not just colonization. And was much smaller and then since around10/04/2019 on evaluation today patient appears to be doing unfortunately worse as far as the size of the wound. I really feel like that this is steadily getting larger again it had been doing excellent right at the beginning of September we have seen a steady increase in the area of the wound it is almost 2-1/2 times the size it was on September 1. Obviously this is a bad trend this is not wanting to see. For that reason we went back to using just the topical triamcinolone cream which does seem to help with inflammation. I checked him for bacteria by way of culture and nothing showed positive there. I am considering giving him a short course of a tapering steroid Dosepak today to see if that is can be beneficial for him. The patient is in agreement with giving that a try. 03/08/2019 on evaluation today patient appears to be doing very well in comparison to last evaluation with regard to his lower extremity ulcer. This is showing signs of less inflammation and actually measuring slightly smaller compared to last time every other week over the past month and a half he has been measuring larger larger larger. Nonetheless I do believe that the issue has been inflammation the prednisone does seem to have been beneficial for him which is good news. No fevers, chills, nausea, vomiting, or diarrhea. 03/22/2019 on evaluation today patient appears to be doing about the same with regard to his leg ulcer. He has been tolerating the dressing changes without complication. With that being said the wound seems to be mostly arrested at its current size but really is not making any progress except for when we prescribed the prednisone. He did show some signs of dropping  as far as the overall size of the wound during that interval week.  Nonetheless this is something he is not on long-term at this point and unfortunately I think he is getting need either this or else the Humira which his dermatologist has discussed try to get approval for. With that being said he will be seeing his dermatologist on the 11th of this month that is November. 04/19/2019 on evaluation today patient appears to be doing really about the same the wound is measuring slightly larger compared to last time I saw him. He has not been into the office since November 2 due to the fact that he unfortunately had Covid as that his entire family. He tells me that it was rough but they did pull-through and he seems to be doing much better. Fortunately there is no signs of active infection at this time. No fevers, chills, nausea, vomiting, or diarrhea. 05/10/2019 on evaluation today patient unfortunately appears to be doing significantly worse as compared to last time I saw him. He does tell me that he has had his first dose of Humira and actually is scheduled to get the next one in the upcoming week. With that being said he tells me also that in the past several days he has been having a lot of issues with green drainage she showed me a picture this is more blue-green in color. He is also been having issues with increased sloughy buildup and the wound does appear to be larger today. Obviously this is not the direction that we want everything to take based on the starting of his Humira. Nonetheless I think this is definitely a result of likely infection and to be honest I think this is probably Pseudomonas causing the infection based on what I am seeing. 05/24/2019 on evaluation today patient unfortunately appears to be doing significantly worse compared to his prior evaluation with me 2 weeks ago. I did review his culture results which showed that he does have Staph aureus as well as Pseudomonas noted on the culture. Nonetheless the Levaquin that I prescribed for him does  not appear to have been appropriate and in fact he tells me he is no longer experiencing the green drainage and discharge that he had at the last visit. Fortunately there is no signs of active infection at this time which is good news although the wound has significantly worsened it in fact is much deeper than it was previous. We have been utilizing up to this point triamcinolone ointment as the prescription topical of choice but at this time I really feel like that the wound is getting need to be packed in order to appropriately manage this due to the deeper nature of the wound. Therefore something along the lines of an alginate dressing may be more appropriate. 05/31/2019 upon inspection today patient's wound actually showed signs of doing poorly at this point. Unfortunately he just does not seem to be making any good progress despite what we have tried. He actually did go ahead and pick up the Cipro and start taking that as he was noticing more green drainage he had previously completed the Levaquin that I prescribed for him as well. Nonetheless he missed his appointment for the seventh last week on Wednesday with the wound care center and [facility] where his dermatologist referred him. Obviously I do think a second opinion would be helpful at this point especially in light of the fact that the patient seems to be doing so poorly  despite the fact that we have tried everything that I really know how at this point. The only thing that ever seems to have helped him in the past is when he was on high doses of continual steroids that did seem to make a difference for him. 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 06/07/2019 upon evaluation today patient unfortunately appears to be doing worse yet again with regard to his wound. In fact I am  starting to question whether or not he may have a fluid pocket in the muscle at this point based on the bulging and the soft appearance to the central portion of the muscle area. There is not anything draining from the muscle itself at this time which is good news but nonetheless the wound is expanding. I am not really seeing any results of the Humira as far as overall wound progression based on what I am seeing at this point. The patient has been referred for second opinion with regard to his wound to the [facility] wound care center by his dermatologist which I definitely am not in opposition to. Unfortunately we tried multiple dressings in the past including collagen, alginate, and at one point even Hydrofera Blue. With that being said he is never really used it for any significant amount of time due to the fact that he often complains of pain associated with these dressings and then will go back to either using the Santyl which she has done intermittently or more frequently the triamcinolone. He is also using his own compression stockings. We have wrapped him in the past but again that was something else that he really was not a big fan of. 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 He seemed to do much better when he was on the steroids although honestly I understand that the steroids are not necessarily the best medication to be on long-term obviously [name], [name] ([id number]) 06/14/2019 on evaluation today patient appears to be doing actually a little bit better with regard to the overall appearance with his leg. Unfortunately he does continue to have issues with what appears to be  some fluid underneath the muscle although he did see the wound specialty center at [facility] last week their main goals were to see about infusion therapy in place of the Humira as they feel like that is not quite strong enough. They also recommended that we continue with the treatment otherwise as we are they felt like that was appropriate and they are okay with him continuing to follow-up here with [place] in that regard. With that being said they are also sending him to the vein specialist there to see about vein stripping and if that would be of benefit for him. Subsequently they also did not really address whether or not an ultrasound of the muscle area to see if there is anything that needs to be addressed here would be appropriate or not. For that reason I discussed this with  him last week I think we may proceed down that road at this point. 06/21/2019 upon evaluation today patient's wound actually appears to be doing slightly better compared to previous evaluations. I do believe that he has made a difference with regard to the progression here with the use of oral steroids. Again in the past has been the only thing that is really calm things down. He does tell me that from [facility] is gotten a good news from there that there are no further vein stripping that is necessary at this point. I do not have that available for review today although the patient did relay this to me. He also did obtain and have the ultrasound of the wound completed which I did sign off on today. It does appear that there is no fluid collection under the muscle this is likely then just edematous tissue in general. That is also good news. Overall I still believe the inflammation is the main issue here. He did inquire about the possibility of a wound VAC again with the muscle protruding like it is I am not really sure whether the wound VAC is necessarily ideal or not. That is something we will have to consider although I do believe he may  need compression wrapping to try to help with edema control which could potentially be of benefit. 06/28/2019 on evaluation today patient appears to be doing slightly better measurement wise although this is not terribly smaller he least seems to be trending towards that direction. With that being said he still seems to have purulent drainage noted in the wound bed at this time. He has been on Levaquin followed by Cipro over the past month. Unfortunately he still seems to have some issues with active infection at this time. I did perform a culture last week in order to evaluate and see if indeed there was still anything going on. Subsequently the culture did come back showing Pseudomonas which is consistent with the drainage has been having which is blue-green in color. He also has had an odor that again was somewhat consistent with Pseudomonas as well. Long story short it appears that the culture showed an intermediate finding with regard to how well the Cipro will work for the Pseudomonas infection. Subsequently being that he does not seem to be clearing up and at best what we are doing is just keeping this at [place] I think he may need to see infectious disease to discuss IV antibiotic options. 07/05/2019 upon evaluation today patient appears to be doing okay in regard to his leg ulcer. He has been tolerating the dressing changes at this point without complication. Fortunately there is no signs of active infection at this time which is good news. No fevers, chills, nausea, vomiting, or diarrhea. With that being said he does have an appointment with infectious disease tomorrow and his primary care on Wednesday. Again the reason for the infectious disease referral was due to the fact that he did not seem to be fully resolving with the use of oral antibiotics and therefore we were thinking that IV antibiotic therapy may be necessary secondary to the fact that there was an intermediate finding for how  effective the Cipro may be. Nonetheless again he has been having a lot of purulent and even green drainage. Fortunately right now that seems to have calmed down over the past week with the reinitiation of the oral antibiotic. Nonetheless we will see what Dr. Megan Salon has to say. 07/12/2019 upon evaluation today patient  appears to be doing about the same at this point in regard to his left lower extremity ulcer. Fortunately there is no signs of active infection at this time which is good news I do believe the Levaquin has been beneficial I did review Dr. Hale Bogus note and to be honest I agree that the patient's leg does appear to be doing better currently. What we found in the past as he does not seem to really completely resolve he will stop the antibiotic and then subsequently things will revert back to having issues with blue-green drainage, increased pain, and overall worsening in general. Obviously that is the reason I sent him back to infectious disease. 07/19/2019 upon evaluation today patient appears to be doing roughly the same in size there is really no dramatic improvement. He has started back on the Levaquin at this point and though he seems to be doing okay he did still have a lot of blue/green drainage noted on evaluation today unfortunately. I think that this is still indicative more likely of a Pseudomonas infection as previously noted and again he does see Dr. Megan Salon in just a couple of days. I do not know that were really able to effectively clear this with just oral antibiotics alone based on what I am seeing currently. Nonetheless we are still continue to try to manage as best we can with regard to the patient and his wound. I do think the wrap was helpful in decreasing the edema which is excellent news. No fevers, chills, nausea, vomiting, or diarrhea. 07/26/2019 upon evaluation today patient appears to be doing slightly better with regard to the overall appearance of the muscle  there is no dark discoloration centrally. Fortunately there is no signs of active infection at this time. No fevers, chills, nausea, vomiting, or diarrhea. Patient's wound bed currently the patient did have an appointment with Dr. Megan Salon at infectious disease last week. With that being said Dr. Megan Salon the patient states was still somewhat hesitant about put him on any IV antibiotics he wanted [place] to repeat cultures today and then see where things go going forward. He does look like Dr. Megan Salon because of some improvement the patient did have with the Levaquin wanted [place] to see about repeating cultures. If it indeed grows the Pseudomonas again then he recommended a possibility of considering a PICC line placement and IV antibiotic therapy. He plans to see the patient back in 1 to 2 weeks. 08/02/2019 upon evaluation today patient appears to be doing poorly with regard to his left lower extremity. We did get the results of his culture back it shows that he is still showing evidence of Pseudomonas which is consistent with the purulent/blue-green drainage that he has currently. Subsequently the culture also shows that he now is showing resistance to the oral fluoroquinolones which is unfortunate as that was really the only thing to treat the infection prior. I do believe that he is looking like this is going require IV antibiotic therapy to get this under control. Fortunately there is no signs of systemic infection at this time which is good news. The patient does see Dr. Megan Salon tomorrow. 08/09/2019 upon evaluation today patient appears to be doing better with regard to his left lower extremity ulcer in regard to the overall appearance. He is currently on IV antibiotic therapy. As ordered by Dr. Megan Salon. Currently the patient is on ceftazidime which she is going to take for the next 2 weeks and then follow-up for 4 to 5-week appointment  with Dr. Megan Salon. The patient started this this past Friday  symptoms have not for a total of 3 days currently in full. 08/16/2019 upon evaluation today patient's wound actually does show muscle in the base of the wound but in general does appear to be much better as far as the overall evidence of infection is concerned. In fact I feel like this is for the most part cleared up he still on the IV antibiotics he has not completed the full course yet but I think he is doing much better which is excellent news. [name], [name] ([id number]) Electronic Signature(s) Signed: 08/16/2019 5:12:37 PM By: Worthy Keeler PA-C Entered By: Worthy Keeler on 08/16/2019 17:12:37 [name], [name] ([id number]) -------------------------------------------------------------------------------- Physical Exam Details Patient Name: [name] Date of Service: 08/16/2019 8:15 AM Medical Record Number: [id number] Patient Account Number: [id number] Date of Birth/Sex: 1980-10-29 (39 y.o. M) Treating RN: Army Melia Primary Care Provider: [name] Other Clinician: Referring Provider: [name] Treating Provider/Extender: Melburn Hake, Roselyn Doby Weeks in Treatment: 140 Constitutional Well-nourished and well-hydrated in no acute distress. Respiratory normal breathing without difficulty. Psychiatric this patient is able to make decisions and demonstrates good insight into disease process. Alert and Oriented x 3. pleasant and cooperative. Notes Upon inspection patient's wound bed actually showed signs of good granulation at this time. Fortunately there is no evidence of active infection which is great news. No fevers, chills, nausea, vomiting, or diarrhea. Electronic Signature(s) Signed: 08/16/2019 5:13:03 PM By: Worthy Keeler PA-C Entered By: Worthy Keeler on 08/16/2019 17:13:03 [name], [name] ([id number]) -------------------------------------------------------------------------------- Physician Orders Details Patient Name: [name] Date of Service: 08/16/2019 8:15  AM Medical Record Number: [id number] Patient Account Number: [id number] Date of Birth/Sex: 06/11/1980 (39 y.o. 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 Anesthetic (add to Medication List) Wound #1 Left,Lateral Lower Leg o Topical Lidocaine 4% cream applied to wound bed prior to debridement (In Clinic Only). Primary Wound Dressing o Collagen - moisten with normal saline Secondary Dressing Wound #1 Left,Lateral Lower Leg o XtraSorb - secure with tape Dressing Change Frequency Wound #1 Left,Lateral Lower Leg o Change dressing every day. Follow-up Appointments Wound #1 Left,Lateral Lower Leg o Return Appointment in 1 week. Edema Control Wound #1 Left,Lateral Lower Leg o Elevate legs to the level of the heart and pump ankles as often as possible - Tubi grip F Electronic Signature(s) Signed: 08/16/2019 9:06:41 AM By: Army Melia Signed: 08/19/2019 5:29:46 PM By: Worthy Keeler PA-C Entered By: Army Melia on 08/16/2019 08:48:40 Heindl, Macallister ([id number]) -------------------------------------------------------------------------------- Problem List Details Patient Name: [name] Date of Service: 08/16/2019 8:15 AM Medical Record Number: [id number] Patient Account Number: [id number] Date of Birth/Sex: [birth year] (39 y.o. M) Treating RN: Army Melia Primary Care Provider: [name] Other Clinician: Referring Provider: [name] Treating Provider/Extender: Melburn Hake,  Jianni Batten Weeks in Treatment: 140 Active Problems ICD-10 Evaluated Encounter Code Description Active Date Today Diagnosis L97.222 Non-pressure chronic ulcer of left calf with fat layer exposed 12/04/2016 No Yes L88 Pyoderma gangrenosum 03/26/2017 No Yes I87.2 Venous insufficiency (chronic) (peripheral) 12/04/2016 No Yes L03.116 Cellulitis of left lower limb 05/24/2019 No  Yes Inactive Problems ICD-10 Code Description Active Date Inactive Date L97.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 M) Treating RN: Army Melia Primary Care Provider: [name] Other Clinician: Referring Provider: [name] Treating Provider/Extender: Melburn Hake, Karie Skowron Weeks in Treatment: 140 Subjective Chief Complaint Information obtained from Patient He is here in follow up evaluation for LLE pyoderma ulcer History of Present Illness (HPI) 12/04/16; 39 year old man who comes into the clinic today for review of a wound on the posterior left calf. He tells me that is been there for about a year. He is not a diabetic he does smoke half a pack per day. He was seen in the ER on 11/20/16 felt to have cellulitis around the wound and was given clindamycin. An x-ray did not show osteomyelitis. The patient initially tells me that he has a milk allergy that sets off a pruritic itching rash on his lower legs which she scratches incessantly and he thinks that's what may have set up the wound. He has been using various topical  antibiotics and ointments without any effect. He works in a [employer] and is on his feet all day. He does not have a prior history of wounds however he does have the rash on both lower legs the right arm and the ventral aspect of his left arm. These are excoriations and clearly have had scratching however there are of macular looking areas on both legs including a substantial larger area on the right leg. This does not have an underlying open area. There is no blistering. The patient tells me that 2 years ago in [place] in response to the rash on his legs he saw a dermatologist who told him he had a condition which may be pyoderma gangrenosum although I may be putting words into his mouth. He seemed to recognize this. 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 He is not in any treatment for this. He's had no recent travel 12/11/16; the patient arrives today with his wound and roughly the same condition we've been using silver alginate this is a deep punched out wound with some surrounding erythema but no tenderness. Biopsy I did did not show confirmed pyoderma gangrenosum suggested nonspecific inflammation and vasculitis but does not provide an actual description of what was seen by the pathologist. I'm really not able to understand this We have also received information from the patient's dermatologist in [place] notes from April 2016. This was a doctor Agarwal-antal. 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 He was prescribed topical steroid high potency under occlusion which helped but at this point the patient did not have a deep punched out wound. 12/18/16; the patient's wound is larger in terms of surface area however this surface looks better and there is less depth. The surrounding erythema also is better. The patient states that the wrap we put on came off 2 days ago when he has been using his compression stockings. He we are in the  process of getting a dermatology consult. 12/26/16 on evaluation today patient's left lower extremity wound shows evidence of infection with surrounding erythema noted. He has been  tolerating the dressing changes but states that he has noted more discomfort. There is a larger area of erythema surrounding the wound. No fevers, chills, nausea, or vomiting noted at this time. With that being said the wound still does have slough covering the surface. He is not allergic to any medication that he is aware of at this point. In regard to his right lower extremity he had several regions that are erythematous and pruritic he wonders if there's anything we can do to help that. 01/02/17 I reviewed patient's wound culture which was obtained his visit last week. He was placed on doxycycline at that point. Unfortunately that does not appear to be an antibiotic that would likely help with the situation however the pseudomonas noted on culture is sensitive to Cipro. Also unfortunately patient's wound seems to have a large compared to last week's evaluation. Not severely so but there are definitely increased measurements in general. He is continuing to have discomfort as well he writes this to be a seven out of 10. In fact he would prefer me not to perform any debridement today due to the fact that he is having discomfort and considering he has an active infection on the little reluctant to do so anyway. No fevers, chills, nausea, or vomiting noted at this time. 01/08/17; patient seems dermatology on September 5. I suspect dermatology will want the slides from the biopsy I did sent to their pathologist. I'm not sure if there is a way we can expedite that. In any case the culture I did before I left on vacation 3 weeks ago showed Pseudomonas he was given 10 days of Cipro and per her description of her intake nurses is actually somewhat better this week although the wound is quite a bit bigger than I remember the last time  I saw this. He still has 3 more days of Cipro 01/21/17; dermatology appointment tomorrow. He has completed the ciprofloxacin for Pseudomonas. Surface of the wound looks better however he is had some deterioration in the lesions on his right leg. Meantime the left lateral leg wound we will continue with sample 01/29/17; patient had his dermatology appointment but I can't yet see that note. He is completed his antibiotics. The wound is more superficial but considerably larger in circumferential area than when he came in. This is in his left lateral calf. He also has swollen erythematous areas with superficial wounds on the right leg and small papular areas on both arms. There apparently areas in her his upper thighs and buttocks I did not look at those. Dermatology biopsied the right leg. Hopefully will have their input next week. 02/05/17; patient went back to see his dermatologist who told him that he had a "scratching problem" as well as staph. He is now on a 30 day course of doxycycline and I believe she gave him triamcinolone cream to the right leg areas to help with the itching [not exactly sure but probably triamcinolone]. She apparently looked at the left lateral leg wound although this was not rebiopsied and I think felt to be ultimately part of the same pathogenesis. He is using sample border foam and changing nevus himself. He now has a new open area on the right posterior leg which was his biopsy site I don't have any of the dermatology notes [name], [name] ([id number]) 02/12/17; we put the patient in compression last week with SANTYL to the wound on the left leg and the biopsy. Edema is much better and the depth of  the wound is now at level of skin. Area is still the same Biopsy site on the right lateral leg we've also been using santyl with a border foam dressing and he is changing this himself. 02/19/17; Using silver alginate started last week to both the substantial left leg wound and the  biopsy site on the right wound. He is tolerating compression well. Has a an appointment with his primary M.D. tomorrow wondering about diuretics although I'm wondering if the edema problem is actually lymphedema 02/26/17; the patient has been to see his primary doctor Dr. Jerrel Ivory at [facility] our primary care. She started him on Lasix 20 mg and this seems to have helped with the edema. However we are not making substantial change with the left lateral calf wound and inflammation. The biopsy site on the right leg also looks stable but not really all that different. 03/12/17; the patient has been to see vein and vascular Dr. Lucky Cowboy. He has had venous reflux studies I have not reviewed these. I did get a call from his dermatology office. They felt that he might have pathergy based on their biopsy on his right leg which led them to look at the slides of the biopsy I did on the left leg and they wonder whether this represents pyoderma gangrenosum which was the original supposition in a man with ulcerative colitis albeit inactive for [name] years. They therefore recommended clobetasol and tetracycline i.e. 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 03/26/17; apparently the patient just had reflux studies not an appointment with Dr. dew. She arrives in clinic today having applied clobetasol for 2-3 weeks. He notes over the last 2-3 days excessive drainage having to change the dressing 3-4 times a day and also expanding erythema. He states the expanding erythema seems to come and go and was last this red was earlier in the month.he is on doxycycline 150 mg twice a day as an anti- inflammatory systemic therapy for possible pyoderma gangrenosum along with the topical clobetasol 04/02/17; the patient was seen last week by Dr. Lillia Carmel at [facility] dermatology locally who kindly saw him at my request. 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 My concern was the degree of erythema medially extending from his left leg wound which was either inflammation from pyoderma or cellulitis. I put him on Augmentin however culture of the wound showed Pseudomonas which is quinolone sensitive. I really don't believe he has cellulitis however in view of everything I will continue and give him a course of Cipro. He is also on doxycycline as an immune modulator for the pyoderma. In addition to his original wound on the left lateral leg with surrounding erythema he has a wound on the right posterior calf which was an original biopsy site done by dermatology. 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 Saw Dr. Lillia Carmel yesterday. He has been using topical antibiotics to both wound areas his original wound on the left and the biopsies/pathergy area on the right. There is definitely some improvement in the inflammation around the wound on the right although the patient states he has increasing sensitivity of the wounds. He is on prednisone 60 and doxycycline 1 as prescribed by Dr. Lillia Carmel. He is covering the topical antibiotic with gauze and putting this in his own compression stocks and changing this daily. He states that Dr. Lottie Rater did a culture of the left leg wound yesterday 05/07/17; pyoderma gangrenosum. The patient  saw Dr. Lillia Carmel yesterday and has a follow-up with her in one month. He is still using topical antibiotics to both wounds although he can't recall exactly what type. He is still on prednisone 60 mg. Dr. Lillia Carmel stated that the doxycycline could stop if we were in agreement. 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 The right medial leg was induced by biopsy/pathergy. The area on the right is essentially healed. 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 The right medial leg has closed and remains closed. He is still on prednisone 60. He tells me he missed his last dermatology appointment with Dr. Lillia Carmel but will make another appointment. He reports that her blood sugar at a recent screen in [place] was high 200's. He was 180 today. 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 I think he is going to require still much longer prednisone perhaps another 3 months before attempting to taper. In the meantime his wound is a lot better. Smaller. He is cleaning this off daily and applying topical antibiotics. When he was last in the clinic I thought about changing to [facility] and actually put in a couple of calls to dermatology although probably not during their business hours. 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 He continues on oral prednisone. He has been using triple antibiotic ointment. There is surface debris and we will transition to [facility] and have him return in 2 weeks. He has lost 30 pounds since his last appointment with lifestyle modification. He may benefit from topical steroid cream for treatment this can be considered at a later date. 08/22/17 on evaluation today patient appears to actually be doing rather well in regard to his left lateral lower extremity ulcer. He has actually been managed by Dr. Dellia Nims most recently. Patient is currently on oral steroids at this time. This seems to have been of benefit for him. Nonetheless his last visit was actually with Leah on 08/06/17. Currently he is not utilizing any topical steroid creams although this could be of benefit as well. No fevers, chills, nausea, or vomiting noted at this time. 09/05/17 on evaluation today patient appears to be doing better in regard to his left lateral  lower extremity ulcer. He has been tolerating the dressing changes without complication. He is using Santyl with good effect. Overall I'm very pleased with how things are standing at this point. Patient likewise is happy that this is doing better. 09/19/17 on evaluation today patient actually appears to be doing rather well in regard to his left lateral lower extremity ulcer. 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 He's not having any significant pain. 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 He has been tolerating the Santyl without complication and in general I feel like he's making good progress. 10/17/17 on evaluation today patient appears to be doing very well in regard to his left lateral lower surety ulcer. He has been tolerating the dressing changes without complication. There does not appear to be any  evidence of infection he's alternating the Santyl and the triple antibiotic ointment every other day this seems to be doing well for him. 11/03/17 on evaluation today patient appears to be doing very well in regard to his left lateral lower extremity ulcer. He is been tolerating the dressing changes without complication which is good news. Fortunately there does not appear to be any evidence of infection which is also great news. Overall is doing excellent they are starting to taper down on the prednisone is down to 40 mg at this point it also started topical clobetasol for him. 11/17/17 on evaluation today patient appears to be doing well in regard to his left lateral lower surety ulcer. He's been tolerating the dressing changes without complication. He does note that he is having no pain, no excessive drainage or discharge, and overall he feels like things are going about how he would expect and hope they would.  Overall he seems to have no evidence of infection at this time in my opinion which is good news. [name], [name] ([id number]) 12/04/17-He is seen in follow-up evaluation for right lateral lower extremity ulcer. He has been applying topical steroid cream. Today's measurement show slight increase in size. Over the next 2 weeks we will transition to every other day Santyl and steroid cream. He has been encouraged to monitor for changes and notify clinic with any concerns 12/15/17 on evaluation today patient's left lateral motion the ulcer and fortunately is doing worse again at this point. This just since last week to this week has close to doubled in size according to the patient. I did not seeing last week's I do not have a visual to compare this to in our system was also down so we do not have all the charts and at this point. Nonetheless it does have me somewhat concerned in regard to the fact that again he was worried enough about it he has contact the dermatology that placed them back on the full strength, 50 mg a day of the prednisone that he was taken previous. He continues to alternate using clobetasol along with Santyl at this point. He is obviously somewhat frustrated. 12/22/17 on evaluation today patient appears to be doing a little worse compared to last evaluation. Unfortunately the wound is a little deeper and slightly larger than the last week's evaluation. With that being said he has made some progress in regard to the irritation surrounding at this time unfortunately despite that progress that's been made he still has a significant issue going on here. 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 For that reason I discussed with him today the possibility of perform a wound culture to ensure there's nothing overtly infected. 01/06/18 on evaluation  today patient's wound is larger and deeper than previously evaluated. With that being said it did appear that his wound was infected after my last evaluation with him. Subsequently I did end up prescribing a prescription for Bactrim DS which she has been taking and having no complication with. Fortunately there does not appear to be any evidence of infection at this point in time as far as anything spreading, no want to touch, and overall I feel like things are showing signs of improvement. 01/13/18 on evaluation today patient appears to be even a little larger and deeper than last time. There still muscle exposed in the base of  the wound. Nonetheless he does appear to be less erythematous I do believe inflammation is calming down also believe the infection looks like it's probably resolved at this time based on what I'm seeing. No fevers, chills, nausea, or vomiting noted at this time. 01/30/18 on evaluation today patient actually appears to visually look better for the most part. Unfortunately those visually this looks better he does seem to potentially have what may be an abscess in the muscle that has been noted in the central portion of the wound. This is the first time that I have noted what appears to be fluctuance in the central portion of the muscle. With that being said I'm somewhat more concerned about the fact that this might indicate an abscess formation at this location. I do believe that an ultrasound would be appropriate. This is likely something we need to try to do as soon as possible. He has been switch to mupirocin ointment and he is no longer using the steroid ointment as prescribed by dermatology he sees them again next week he's been decreased from 60 to 40 mg of prednisone. 03/09/18 on evaluation today patient actually appears to be doing a little better compared to last time I saw him. There's not as much erythema surrounding the wound itself. He I did review his most recent  infectious disease note which was dated 02/24/18. He saw Dr. Michel Bickers in [place]. 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 Patient is now off of antibiotics and they are continually observing this. There seems to be no change in the past two weeks in my pinion based on what the patient says and what I see today compared to what Dr. Megan Salon likely saw two weeks ago. No fevers, chills, nausea, or vomiting noted at this time. 03/23/18 on evaluation today patient's wound actually appears to be showing signs of improvement which is good news. He is currently still on the Dapsone. He is also working on tapering the prednisone to get off of this and Dr. Lottie Rater is working with him in this regard. Nonetheless overall I feel like the wound is doing well it does appear based on the infectious disease note that I reviewed from Dr. 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 I did also review the results of his ultrasound of left lower extremity which revealed there was a dentist tissue in the base of the wound without an abscess noted. 04/06/18 on evaluation today the patient's left lateral lower extremity ulcer actually appears to be doing fairly well which is excellent news. There does not appear to be any evidence of infection at this time which is also great news. Overall he still does have a significantly large ulceration although little by little he seems to be making progress. He is down to 10 mg a day of the prednisone. 04/20/18 on evaluation today patient actually appears to be doing excellent at this time in regard to his left lower extremity ulcer. He's making signs of good progress unfortunately this is taking much longer than we would really like to see but nonetheless he is making progress.  Fortunately there does not appear to be any evidence of infection at this time. No fevers, chills, nausea, or vomiting noted at this time. The patient has not  been using the Santyl due to the cost he hadn't got in this field yet. He's mainly been using the antibiotic ointment topically. Subsequently he also tells me that he really has not been scrubbing in the shower I think this would be helpful again as I told him it doesn't have to be anything too aggressive to even make it believe just enough to keep it free of some of the loose slough and biofilm on the wound surface. 05/11/18 on evaluation today patient's wound appears to be making slow but sure progress in regard to the left lateral lower extremity ulcer. He is been tolerating the dressing changes without complication. Fortunately there does not appear to be any evidence of infection at this time. He is still just using triple antibiotic ointment along with clobetasol occasionally over the area. He never got the Santyl and really does not seem to intend to in my pinion. 06/01/18 on evaluation today patient appears to be doing a little better in regard to his left lateral lower extremity ulcer. He states that overall he does not feel like he is doing as well with the Dapsone as he did with the prednisone. Nonetheless he sees his dermatologist later today and is gonna talk to them about the possibility of going back on the prednisone. Overall again I believe that the wound would be better if you would utilize Santyl but he really does not seem to be interested in going back to the [place] at this point. He has been using triple antibiotic ointment. 06/15/18 on evaluation today patient's wound actually appears to be doing about the same at this point. Fortunately there is no signs of infection at this time. He has made slight improvements although he continues to not really want to clean the wound bed at this point. He states that he just doesn't mess  with it he doesn't want to cause any problems with everything else he has going on. He has been on medication, antibiotics as prescribed by his dermatologist, for a staff infection of his lower extremities which is really drying out now and looking much better he tells me. Fortunately there is no sign of overall infection. 06/29/18 on evaluation today patient appears to be doing well in regard to his left lateral lower surety ulcer all things considering. Fortunately his staff infection seems to be greatly improved compared to previous. He has no signs of infection and this is drying up quite nicely. He is still the doxycycline Soto, [name] ([id number]) for this is no longer on cental, Dapsone, or any of the other medications. His dermatologist has recommended possibility of an infusion but right now he does not want to proceed with that. 07/13/18 on evaluation today patient appears to be doing about the same in regard to his left lateral lower surety ulcer. Fortunately there's no signs of infection at this time which is great news. Unfortunately he still builds up a significant amount of Slough/biofilm of the surface of the wound he still is not really cleaning this as he should be appropriately. Again I'm able to easily with saline and gauze remove the majority of this on the surface which if you would do this at home would likely be a dramatic improvement for him as far as getting the area to improve. Nonetheless overall I still feel like he is making progress is just very slow. I think Santyl will be of benefit for him as well. Still he has not gotten this as of this point.  07/27/18 on evaluation today patient actually appears to be doing little worse in regards of the erythema around the periwound region of the wound he also tells me that he's been having more drainage currently compared to what he was experiencing last time I saw him. He states not quite as bad as what he had because this was  infected previously but nonetheless is still appears to be doing poorly. Fortunately there is no evidence of systemic infection at this point. The patient tells me that he is not going to be able to afford the Santyl. He is still waiting to hear about the infusion therapy with his dermatologist. Apparently she wants an updated colonoscopy first. 08/10/18 on evaluation today patient appears to be doing better in regard to his left lateral lower extremity ulcer. Fortunately he is showing signs of improvement in this regard he's actually been approved for Remicade infusion's as well although this has not been scheduled as of yet. Fortunately there's no signs of active infection at this time in regard to the wound although he is having some issues with infection of the right lower extremity is been seen as dermatologist for this. Fortunately they are definitely still working with him trying to keep things under control. 09/07/18 on evaluation today patient is actually doing rather well in regard to his left lateral lower extremity ulcer. He notes these actually having some hair grow back on his extremity which is something he has not seen in years. He also tells me that the pain is really not giving them any trouble at this time which is also good news overall she is very pleased with the progress he's using a combination of the mupirocin along with the probate is all mixed. 09/21/18 on evaluation today patient actually appears to be doing fairly well all things considered in regard to his looks from the ulcer. He's been tolerating the dressing changes without complication. Fortunately there's no signs of active infection at this time which is good news he is still on all antibiotics or prevention of the staff infection. He has been on prednisone for time although he states it is gonna contact his dermatologist and see if she put them on a short course due to some irritation that he has going on currently.  Fortunately there's no evidence of any overall worsening this is going very slow I think cental would be something that would be helpful for him although he states that $50 for tube is quite expensive. He therefore is not willing to get that at this point. 10/06/18 on evaluation today patient actually appears to be doing decently well in regard to his left lateral leg ulcer. He's been tolerating the dressing changes without complication. Fortunately there's no signs of active infection at this time. Overall I'm actually rather pleased with the progress he's making although it's slow he doesn't show any signs of infection and he does seem to be making some improvement. I do believe that he may need a switch up and dressings to try to help this to heal more appropriately and quickly. 10/19/18 on evaluation today patient actually appears to be doing better in regard to his left lateral lower extremity ulcer. This is shown signs of having much less Slough buildup at this point due to the fact he has been using the [employer]. Obviously this is very good news. The overall size of the wound is not dramatically smaller but again the appearance is. 11/02/18 on evaluation today patient actually appears to  be doing quite well in regard to his lower [place] ulcer. A lot of the skin around the ulcer is actually somewhat irritating at this point this seems to be more due to the dressing causing irritation from the adhesive that anything else. Fortunately there is no signs of active infection at this time. 11/24/18 on evaluation today patient appears to be doing a little worse in regard to his overall appearance of his lower extremity ulcer. There's more erythema and warmth around the wound unfortunately. He is currently on doxycycline which he has been on for some time. With that being said I'm not sure that seems to be helping with what appears to possibly be an acute cellulitis with regard to his left lower extremity  ulcer. No fevers, chills, nausea, or vomiting noted at this time. 12/08/18 on evaluation today patient's wounds actually appears to be doing significantly better compared to his last evaluation. He has been using Santyl along with alternating tripling about appointment as well as the steroid cream seems to be doing quite well and the wound is showing signs of improvement which is excellent news. Fortunately there's no evidence of infection and in fact his culture came back negative with only normal skin flora noted. 12/21/2018 upon evaluation today patient actually appears to be doing excellent with regard to his ulcer. This is actually the best that I have seen it since have been helping to take care of him. It is both smaller as well as less slough noted on the surface of the wound and seems to be showing signs of good improvement with new skin growing from the edges. He has been using just the triamcinolone he does wonder if he can get a refill of that ointment today. 01/04/2019 upon evaluation today patient actually appears to be doing well with regard to his left lateral lower extremity ulcer. With that being said it does not appear to be that he is doing quite as well as last time as far as progression is concerned. There does not appear to be any signs of infection or significant irritation which is good news. With that being said I do believe that he may benefit from switching to a collagen based dressing based on how clean The wound appears. 01/18/2019 on evaluation today patient actually appears to be doing well with regard to his wound on the left lower extremity. He is not made a lot of progress compared to where we were previous but nonetheless does seem to be doing okay at this time which is good news. There is no signs of active infection which is also good news. My only concern currently is I do wish we can get him into utilizing the collagen dressing his insurance would not pay for the  supplies that we ordered although it appears that he may be able to order this through his supply company that he typically utilizes. This is [employer]. Nonetheless he did try to order it during the office visit today and it appears this did go through. We will see if he can get that it is a different brand but nonetheless he has collagen and I do think will be beneficial. JERMANY, [name] ([id number]) 02/01/2019 on evaluation today patient actually appears to be doing a little worse today in regard to the overall size of his wounds. Fortunately there is no signs of active infection at this time. That is visually. Nonetheless when this is happened before it was due to infection. For that reason were somewhat  concerned about that this time as well. 02/08/2019 on evaluation today patient unfortunately appears to be doing slightly worse with regard to his wound upon evaluation today. Is measuring a little deeper and a little larger unfortunately. I am not really sure exactly what is causing this to enlarge he actually did see his dermatologist she is going to see about initiating Humira for him. Subsequently she also did do steroid injections into the wound itself in the periphery. Nonetheless still nonetheless he seems to be getting a little bit larger he is gone back to just using the steroid cream topically which I think is appropriate. I would say hold off on the collagen for the time being is definitely a good thing to do. Based on the culture results which we finally did get the final result back regarding it shows staph as the bacteria noted again that can be a normal skin bacteria based on the fact however he is having increased drainage and worsening of the wound measurement wise I would go ahead and place him on an antibiotic today I do believe for this. 02/15/2019 on evaluation today patient actually appears to be doing somewhat better in regard to his ulcer. There is no signs of worsening at this time  I did review his culture results which showed evidence of Staphylococcus aureus but not MRSA. Again this could just be more related to the normal skin bacteria although he states the drainage has slowed down quite a bit he may have had a mild infection not just colonization. And was much smaller and then since around10/04/2019 on evaluation today patient appears to be doing unfortunately worse as far as the size of the wound. I really feel like that this is steadily getting larger again it had been doing excellent right at the beginning of September we have seen a steady increase in the area of the wound it is almost 2-1/2 times the size it was on September 1. Obviously this is a bad trend this is not wanting to see. For that reason we went back to using just the topical triamcinolone cream which does seem to help with inflammation. I checked him for bacteria by way of culture and nothing showed positive there. I am considering giving him a short course of a tapering steroid Dosepak today to see if that is can be beneficial for him. The patient is in agreement with giving that a try. 03/08/2019 on evaluation today patient appears to be doing very well in comparison to last evaluation with regard to his lower extremity ulcer. This is showing signs of less inflammation and actually measuring slightly smaller compared to last time every other week over the past month and a half he has been measuring larger larger larger. Nonetheless I do believe that the issue has been inflammation the prednisone does seem to have been beneficial for him which is good news. No fevers, chills, nausea, vomiting, or diarrhea. 03/22/2019 on evaluation today patient appears to be doing about the same with regard to his leg ulcer. He has been tolerating the dressing changes without complication. With that being said the wound seems to be mostly arrested at its current size but really is not making any progress except for when  we prescribed the prednisone. He did show some signs of dropping as far as the overall size of the wound during that interval week. Nonetheless this is something he is not on long-term at this point and unfortunately I think he is getting need  either this or else the Humira which his dermatologist has discussed try to get approval for. With that being said he will be seeing his dermatologist on the 11th of this month that is November. 04/19/2019 on evaluation today patient appears to be doing really about the same the wound is measuring slightly larger compared to last time I saw him. He has not been into the office since November 2 due to the fact that he unfortunately had Covid as that his entire family. He tells me that it was rough but they did pull-through and he seems to be doing much better. Fortunately there is no signs of active infection at this time. No fevers, chills, nausea, vomiting, or diarrhea. 05/10/2019 on evaluation today patient unfortunately appears to be doing significantly worse as compared to last time I saw him. He does tell me that he has had his first dose of Humira and actually is scheduled to get the next one in the upcoming week. With that being said he tells me also that in the past several days he has been having a lot of issues with green drainage she showed me a picture this is more blue-green in color. He is also been having issues with increased sloughy buildup and the wound does appear to be larger today. Obviously this is not the direction that we want everything to take based on the starting of his Humira. Nonetheless I think this is definitely a result of likely infection and to be honest I think this is probably Pseudomonas causing the infection based on what I am seeing. 05/24/2019 on evaluation today patient unfortunately appears to be doing significantly worse compared to his prior evaluation with me 2 weeks ago. I did review his culture results which showed  that he does have Staph aureus as well as Pseudomonas noted on the culture. Nonetheless the Levaquin that I prescribed for him does not appear to have been appropriate and in fact he tells me he is no longer experiencing the green drainage and discharge that he had at the last visit. Fortunately there is no signs of active infection at this time which is good news although the wound has significantly worsened it in fact is much deeper than it was previous. We have been utilizing up to this point triamcinolone ointment as the prescription topical of choice but at this time I really feel like that the wound is getting need to be packed in order to appropriately manage this due to the deeper nature of the wound. Therefore something along the lines of an alginate dressing may be more appropriate. 05/31/2019 upon inspection today patient's wound actually showed signs of doing poorly at this point. Unfortunately he just does not seem to be making any good progress despite what we have tried. He actually did go ahead and pick up the Cipro and start taking that as he was noticing more green drainage he had previously completed the Levaquin that I prescribed for him as well. Nonetheless he missed his appointment for the seventh last week on Wednesday with the wound care center and [facility] where his dermatologist referred him. Obviously I do think a second opinion would be helpful at this point especially in light of the fact that the patient seems to be doing so poorly despite the fact that we have tried everything that I really know how at this point. The only thing that ever seems to have helped him in the past is when he was on  high doses of continual steroids that did seem to make a difference for him. Right now he is on immune modulating medication to try to help with the pyoderma but I am not sure that he is getting as much relief at this point as he is previously obtained from the use of  steroids. 06/07/2019 upon evaluation today patient unfortunately appears to be doing worse yet again with regard to his wound. In fact I am starting to question whether or not he may have a fluid pocket in the muscle at this point based on the bulging and the soft appearance to the central portion of the muscle area. There is not anything draining from the muscle itself at this time which is good news but nonetheless the wound is expanding. I am not really seeing any results of the Humira as far as overall wound progression based on what I am seeing at this point. The patient has been referred for second opinion with regard to his wound to the [facility] wound care center by his dermatologist which I definitely am not in opposition to. Unfortunately we tried multiple dressings in the past including collagen, alginate, and at one point even Hydrofera Blue. With that being said he is never really used it for any significant amount of time due to the fact that he often complains of pain associated with these dressings and then will go back to either using the Santyl which she has done intermittently or more frequently the triamcinolone. He is also using his own compression stockings. We have wrapped him in the past but again that was something else that he really was not a big fan of. 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 He seemed to do much better when he was on the steroids although honestly I understand that the steroids are not necessarily the best medication to be on long-term obviously 06/14/2019 on evaluation today patient appears to be doing actually a  little bit better with regard to the overall appearance with his leg. Unfortunately he does continue to have issues with what appears to be some fluid underneath the muscle although he did see the wound specialty center at [facility] last week their main goals were to see about infusion therapy in place of the Humira as they feel like that is not quite strong enough. They also recommended that we continue with the treatment otherwise as we are they felt like that was appropriate and they are okay with him continuing to follow-up here with [place] in that regard. With that being said they are also sending him to the vein specialist there to see about vein stripping and if that would be of benefit for him. Subsequently they also did not really address whether or not an ultrasound of the muscle area to see if there is anything that needs to be addressed here would be appropriate or not. For that reason I discussed this with him last week I think we may proceed down that road at this point. 06/21/2019 upon evaluation today patient's wound actually appears to be doing slightly better compared to previous evaluations. I do believe  that he has made a difference with regard to the progression here with the use of oral steroids. Again in the past has been the only thing that is really calm things down. He does tell me that from [facility] is gotten a good news from there that there are no further vein stripping that is necessary at this point. I do not have that available for review today although the patient did relay this to me. He also did obtain and have the ultrasound of the wound completed which I did sign off on today. It does appear that there is no fluid collection under the muscle this is likely then just edematous tissue in general. That is also good news. Overall I still believe the inflammation is the main issue here. He did inquire about the possibility of a wound VAC again with the muscle protruding like it is I am  not really sure whether the wound VAC is necessarily ideal or not. That is something we will have to consider although I do believe he may need compression wrapping to try to help with edema control which could potentially be of benefit. 06/28/2019 on evaluation today patient appears to be doing slightly better measurement wise although this is not terribly smaller he least seems to be trending towards that direction. With that being said he still seems to have purulent drainage noted in the wound bed at this time. He has been on Levaquin followed by Cipro over the past month. Unfortunately he still seems to have some issues with active infection at this time. I did perform a culture last week in order to evaluate and see if indeed there was still anything going on. Subsequently the culture did come back showing Pseudomonas which is consistent with the drainage has been having which is blue-green in color. He also has had an odor that again was somewhat consistent with Pseudomonas as well. Long story short it appears that the culture showed an intermediate finding with regard to how well the Cipro will work for the Pseudomonas infection. Subsequently being that he does not seem to be clearing up and at best what we are doing is just keeping this at [place] I think he may need to see infectious disease to discuss IV antibiotic options. 07/05/2019 upon evaluation today patient appears to be doing okay in regard to his leg ulcer. He has been tolerating the dressing changes at this point without complication. Fortunately there is no signs of active infection at this time which is good news. No fevers, chills, nausea, vomiting, or diarrhea. With that being said he does have an appointment with infectious disease tomorrow and his primary care on Wednesday. Again the reason for the infectious disease referral was due to the fact that he did not seem to be fully resolving with the use of oral antibiotics and therefore  we were thinking that IV antibiotic therapy may be necessary secondary to the fact that there was an intermediate finding for how effective the Cipro may be. Nonetheless again he has been having a lot of purulent and even green drainage. Fortunately right now that seems to have calmed down over the past week with the reinitiation of the oral antibiotic. Nonetheless we will see what Dr. Megan Salon has to say. 07/12/2019 upon evaluation today patient appears to be doing about the same at this point in regard to his left lower extremity ulcer. Fortunately there is no signs of active infection at this time which is good news I  do believe the Levaquin has been beneficial I did review Dr. Hale Bogus note and to be honest I agree that the patient's leg does appear to be doing better currently. What we found in the past as he does not seem to really completely resolve he will stop the antibiotic and then subsequently things will revert back to having issues with blue-green drainage, increased pain, and overall worsening in general. Obviously that is the reason I sent him back to infectious disease. 07/19/2019 upon evaluation today patient appears to be doing roughly the same in size there is really no dramatic improvement. He has started back on the Levaquin at this point and though he seems to be doing okay he did still have a lot of blue/green drainage noted on evaluation today unfortunately. I think that this is still indicative more likely of a Pseudomonas infection as previously noted and again he does see Dr. Megan Salon in just a couple of days. I do not know that were really able to effectively clear this with just oral antibiotics alone based on what I am seeing currently. Nonetheless we are still continue to try to manage as best we can with regard to the patient and his wound. I do think the wrap was helpful in decreasing the edema which is excellent news. No fevers, chills, nausea, vomiting, or  diarrhea. 07/26/2019 upon evaluation today patient appears to be doing slightly better with regard to the overall appearance of the muscle there is no dark discoloration centrally. Fortunately there is no signs of active infection at this time. No fevers, chills, nausea, vomiting, or diarrhea. Patient's wound bed currently the patient did have an appointment with Dr. Megan Salon at infectious disease last week. With that being said Dr. Megan Salon the patient states was still somewhat hesitant about put him on any IV antibiotics he wanted [place] to repeat cultures today and then see where things go going forward. He does look like Dr. Megan Salon because of some improvement the patient did have with the Levaquin wanted [place] to see about repeating cultures. If it indeed grows the Pseudomonas again then he recommended a possibility of considering a PICC line placement and IV antibiotic therapy. He plans to see the patient back in 1 to 2 weeks. 08/02/2019 upon evaluation today patient appears to be doing poorly with regard to his left lower extremity. We did get the results of his culture back it shows that he is still showing evidence of Pseudomonas which is consistent with the purulent/blue-green drainage that he has currently. Subsequently the culture also shows that he now is showing resistance to the oral fluoroquinolones which is unfortunate as that was really the only thing to treat the infection prior. I do believe that he is looking like this is going require IV antibiotic therapy to get this under control. Fortunately there is no signs of systemic infection at this time which is good news. The patient does see Dr. Megan Salon tomorrow. 08/09/2019 upon evaluation today patient appears to be doing better with regard to his left lower extremity ulcer in regard to the overall appearance. He is currently on IV antibiotic therapy. As ordered by Dr. Megan Salon. Currently the patient is on ceftazidime which she is going to  take for the next 2 weeks and then follow-up for 4 to 5-week appointment with Dr. Megan Salon. The patient started this this past Friday symptoms have not for a total of 3 days currently in full. [name], [name] ([id number]) 08/16/2019 upon evaluation today patient's wound actually does  show muscle in the base of the wound but in general does appear to be much better as far as the overall evidence of infection is concerned. In fact I feel like this is for the most part cleared up he still on the IV antibiotics he has not completed the full course yet but I think he is doing much better which is excellent news. Objective Constitutional Well-nourished and well-hydrated in no acute distress. Vitals Time Taken: 8:15 AM, Height: 71 in, Weight: 338 lbs, BMI: 47.1, Temperature: 98.7 F, Pulse: 81 bpm, Respiratory Rate: 18 breaths/min, Blood Pressure: 136/81 mmHg. Respiratory normal breathing without difficulty. Psychiatric this patient is able to make decisions and demonstrates good insight into disease process. Alert and Oriented x 3. pleasant and cooperative. General Notes: Upon inspection patient's wound bed actually showed signs of good granulation at this time. Fortunately there is no evidence of active infection which is great news. No fevers, chills, nausea, vomiting, or diarrhea. Integumentary (Hair, Skin) Wound #1 status is Open. Original cause of wound was Gradually Appeared. The wound is located on the Left,Lateral Lower Leg. The wound measures 7.2cm length x 6.7cm width x 0.9cm depth; 37.888cm^2 area and 34.099cm^3 volume. There is muscle and Fat Layer (Subcutaneous Tissue) Exposed exposed. There is no tunneling or undermining noted. There is a medium amount of serous drainage noted. The wound margin is epibole. There is no granulation within the wound bed. There is a large (67-100%) amount of necrotic tissue within the wound bed including Adherent Slough. 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 Anesthetic (add to Medication List): Wound #1 Left,Lateral Lower Leg: Topical Lidocaine 4% cream applied to wound bed prior to debridement (In Clinic Only). Primary Wound Dressing: Collagen - moisten with normal saline Secondary Dressing: Wound #1 Left,Lateral Lower Leg: XtraSorb - secure with tape Tatsch, Ramzi ([id number]) Dressing Change Frequency: Wound #1 Left,Lateral Lower Leg: Change dressing every day. Follow-up Appointments: Wound #1 Left,Lateral Lower Leg: Return Appointment in 1 week. Edema Control: Wound #1 Left,Lateral Lower Leg: Elevate legs to the level of the heart and pump ankles as often as possible - Tubi grip F 1. My suggestion at this time is good to be that we go ahead and have the patient try the collagen dressing as I feel like the wound is very dry really does not need alginate at this point. Is in agreement with giving this a try. 2. Also can recommend currently that he continue to use his compression stocking I think that is good for the time being. 3. I am also can recommend the patient try to obviously complete his oral antibiotics and then we will see what Dr. Megan Salon recommends following. He is almost done with that course. We will see patient back for reevaluation in 1 week here in the clinic. If anything worsens or changes patient will contact our office for additional recommendations. Electronic Signature(s) Signed: 08/16/2019 5:13:42 PM By: Worthy Keeler PA-C Entered By: Worthy Keeler on 08/16/2019 17:13:41 [name], [name] ([id number]) -------------------------------------------------------------------------------- SuperBill Details Patient Name: [name] Date of Service:  08/16/2019 Medical Record Number: [id number] Patient Account Number: [id number] Date of Birth/Sex: [birth year] (39 y.o. M) Treating RN: Army Melia Primary Care Provider: [name] Other Clinician: Referring Provider: [name] Treating Provider/Extender: Melburn Hake, Yaremi Stahlman Weeks in Treatment: 140 Diagnosis Coding  ICD-10 Codes Code Description [phone] Non-pressure chronic ulcer of left calf with fat layer exposed L88 Pyoderma gangrenosum I87.2 Venous insufficiency (chronic) (peripheral) L03.116 Cellulitis of left lower limb Facility Procedures CPT4 Code: [id number] Description: 48472 - WOUND CARE VISIT-LEV 3 EST PT Modifier: Quantity: 1 Physician Procedures CPT4 Code: [id number] Description: 83374 - WC PHYS LEVEL 3 - EST PT Modifier: Quantity: 1 CPT4 Code: Description: ICD-10 Diagnosis Description L97.222 Non-pressure chronic ulcer of left calf with fat layer exposed L88 Pyoderma gangrenosum I87.2 Venous insufficiency (chronic) (peripheral) L03.116 Cellulitis of left lower limb Modifier: Quantity: Electronic Signature(s) Signed: 08/16/2019 5:14:02 PM By: Worthy Keeler PA-C Entered By: Worthy Keeler on 08/16/2019 17:14:02

## 2019-08-17 ENCOUNTER — Telehealth: Payer: Self-pay

## 2019-08-17 DIAGNOSIS — Z79899 Other long term (current) drug therapy: Principal | ICD-10-CM

## 2019-08-17 NOTE — Unmapped (Addendum)
I called leaving a message for Mr Kawashima to check his MyChart message from Dr Kathryne Eriksson related to lab work.  I will call again to verify he received the message.      Renae Fickle     ----- Message from Shari Heritage, MD sent at 08/17/2019  3:11 PM EDT -----  Regarding: call  Hello!      Please call this patient and let him know that I have sent in some labs for him to get at [facility] prior to his infusion.I sent him a mychart as well but he historically has not checked this frequently.    Thanks!!

## 2019-08-17 NOTE — Unmapped (Signed)
Patient to obtain new baseline monitoring labs for [facility] prior to infusion scheduled at [facility].  Mychart message sent and patient called as well.

## 2019-08-17 NOTE — Telephone Encounter (Signed)
Mary with [facility] called office today requesting orders to pull patient's picc after last dose on 4/2. Nursing is needing orders before end date. Will forward message to MD. Aundria Rud, [place]

## 2019-08-17 NOTE — Telephone Encounter (Signed)
Yes

## 2019-08-18 NOTE — Progress Notes (Signed)
[name], [name] ([id number]) Visit Report for 08/16/2019 Arrival Information Details Patient Name: [name], [name] Date of Service: 08/16/2019 8:15 AM Medical Record Number: [id number] Patient Account Number: [id number] Date of Birth/Sex: 1980/06/12 (39 y.o. M) Treating RN: [name] Primary Care Blake Vetrano: [name] Other Clinician: Referring Oliverio Cho: [name] Treating Jaxton Casale/Extender: Melburn Hake, [name]: 140 Visit Information History Since Last Visit Added or deleted any medications: No Patient Arrived: Ambulatory Any new allergies or adverse reactions: No Arrival Time: 08:16 Had a fall or experienced change in No Accompanied By: self activities of daily living that may affect Transfer Assistance: None risk of falls: Patient Identification Verified: Yes Signs or symptoms of abuse/neglect since last visito No Secondary Verification Process Completed: Yes Hospitalized since last visit: No Patient Requires Transmission-Based No Implantable device outside of the clinic excluding No Precautions: cellular tissue based products placed in the center Patient Has Alerts: Yes since last visit: Patient Alerts: 08/09/19 Picc Line Has Dressing in Place as Prescribed: Yes Right Pain Present Now: No Electronic Signature(s) Signed: 08/18/2019 11:21:31 AM By: [name], [name], RN, [name], [name], [name] Entered By: [name], [name], RN, [name], [name] on 08/16/2019 08:17:01 [name] ([id number]) -------------------------------------------------------------------------------- Clinic Level of Care Assessment Details Patient Name: [name] Date of Service: 08/16/2019 8:15 AM Medical Record Number: [id number] Patient Account Number: [id number] Date of Birth/Sex: Oct 16, 1980 (39 y.o. M) Treating RN: Army Melia Primary Care Babe Clenney: [name] Other Clinician: Referring Roi Jafari: [name] Treating Myrikal Messmer/Extender: Melburn Hake, [name]: 140 Clinic Level  of Care Assessment Items TOOL 4 Quantity Score []  - Use when only an EandM is performed on FOLLOW-UP visit 0 ASSESSMENTS - Nursing Assessment / Reassessment X - Reassessment of Co-morbidities (includes updates in patient status) 1 10 X- 1 5 Reassessment of Adherence to Treatment Plan ASSESSMENTS - Wound and Skin Assessment / Reassessment X - Simple Wound Assessment / Reassessment - one wound 1 5 []  - 0 Complex Wound Assessment / Reassessment - multiple wounds []  - 0 Dermatologic / Skin Assessment (not related to wound area) ASSESSMENTS - Focused Assessment []  - Circumferential Edema Measurements - multi extremities 0 []  - 0 Nutritional Assessment / Counseling / Intervention []  - 0 Lower Extremity Assessment (monofilament, tuning fork, pulses) []  - 0 Peripheral Arterial Disease Assessment (using hand held doppler) ASSESSMENTS - Ostomy and/or Continence Assessment and Care []  - Incontinence Assessment and Management 0 []  - 0 Ostomy Care Assessment and Management (repouching, etc.) PROCESS - Coordination of Care X - Simple Patient / Family Education for ongoing care 1 15 []  - 0 Complex (extensive) Patient / Family Education for ongoing care []  - 0 Staff obtains Programmer, systems, Records, Test Results / Process Orders []  - 0 Staff telephones HHA, Nursing Homes / Clarify orders / etc []  - 0 Routine Transfer to another Facility (non-emergent condition) []  - 0 Routine Hospital Admission (non-emergent condition) []  - 0 New Admissions / Biomedical engineer / Ordering NPWT, Apligraf, etc. []  - 0 Emergency Hospital Admission (emergent condition) X- 1 10 Simple Discharge Coordination []  - 0 Complex (extensive) Discharge Coordination PROCESS - Special Needs []  - Pediatric / Minor Patient Management 0 []  - 0 Isolation Patient Management []  - 0 Hearing / Language / Visual special needs []  - 0 Assessment of Community assistance (transportation, D/C planning, etc.) [name], [name]  ([id number]) []  - 0 Additional assistance / Altered mentation []  - 0 Support Surface(s) Assessment (bed, cushion, seat, etc.) INTERVENTIONS - Wound Cleansing / Measurement X - Simple Wound Cleansing -  one wound 1 5 []  - 0 Complex Wound Cleansing - multiple wounds X- 1 5 Wound Imaging (photographs - any number of wounds) []  - 0 Wound Tracing (instead of photographs) X- 1 5 Simple Wound Measurement - one wound []  - 0 Complex Wound Measurement - multiple wounds INTERVENTIONS - Wound Dressings []  - Small Wound Dressing one or multiple wounds 0 X- 1 15 Medium Wound Dressing one or multiple wounds []  - 0 Large Wound Dressing one or multiple wounds []  - 0 Application of Medications - topical []  - 0 Application of Medications - injection INTERVENTIONS - Miscellaneous []  - External ear exam 0 []  - 0 Specimen Collection (cultures, biopsies, blood, body fluids, etc.) []  - 0 Specimen(s) / Culture(s) sent or taken to Lab for analysis []  - 0 Patient Transfer (multiple staff / Civil Service fast streamer / Similar devices) []  - 0 Simple Staple / Suture removal (25 or less) []  - 0 Complex Staple / Suture removal (26 or more) []  - 0 Hypo / Hyperglycemic Management (close monitor of Blood Glucose) []  - 0 Ankle / Brachial Index (ABI) - do not check if billed separately X- 1 5 Vital Signs Has the patient been seen at the hospital within the last three years: Yes Total Score: 80 Level Of Care: New/Established - Level 3 Electronic Signature(s) Signed: 08/16/2019 9:06:41 AM By: Army Melia Entered By: Army Melia on 08/16/2019 08:48:59 [name], [name] ([id number]) -------------------------------------------------------------------------------- Encounter Discharge Information Details Patient Name: [name] Date of Service: 08/16/2019 8:15 AM Medical Record Number: [id number] Patient Account Number: [id number] Date of Birth/Sex: 16-Sep-1980 (39 y.o. M) Treating RN: Army Melia Primary Care Raelea Gosse:  [name] Other Clinician: Referring Jaylia Pettus: [name] Treating Arrington Yohe/Extender: Melburn Hake, [name]: 140 Encounter Discharge Information Items Discharge Condition: Stable Ambulatory Status: Ambulatory Discharge Destination: Home Transportation: Private Auto Accompanied By: self Schedule Follow-up Appointment: Yes Clinical Summary of Care: Electronic Signature(s) Signed: 08/16/2019 9:06:41 AM By: Army Melia Entered By: Army Melia on 08/16/2019 08:49:55 [name] ([id number]) -------------------------------------------------------------------------------- Lower Extremity Assessment Details Patient Name: [name] Date of Service: 08/16/2019 8:15 AM Medical Record Number: [id number] Patient Account Number: [id number] Date of Birth/Sex: 05-27-1980 (39 y.o. M) Treating RN: [name] Primary Care Yeudiel Mateo: [name] Other Clinician: Referring Lunette Tapp: [name] Treating Lucie Friedlander/Extender: Melburn Hake, [name]: 140 Edema Assessment Assessed: [Left: No] [Right: No] [Left: Edema] [Right: :] Calf Left: Right: Point of Measurement: 32 cm From Medial Instep 50 cm cm Ankle Left: Right: Point of Measurement: 10 cm From Medial Instep 31 cm cm Vascular Assessment Pulses: Dorsalis Pedis Palpable: [Left:Yes] Electronic Signature(s) Signed: 08/18/2019 11:21:31 AM By: [name], [name], RN, [name], [name], [name] Entered By: [name], [name], RN, [name], [name] on 08/16/2019 08:23:17 [name] ([id number]) -------------------------------------------------------------------------------- Multi Wound Chart Details Patient Name: [name] Date of Service: 08/16/2019 8:15 AM Medical Record Number: [id number] Patient Account Number: [id number] Date of Birth/Sex: 09-16-1980 (39 y.o. M) Treating RN: Army Melia Primary Care Kebin Maye: [name] Other Clinician: Referring Wrigley Plasencia: [name] Treating [place]/Extender: Melburn Hake,  [name]: 140 Vital Signs Height(in): 71 Pulse(bpm): 81 Weight(lbs): 338 Blood Pressure(mmHg): 136/81 Body Mass Index(BMI): 47 Temperature(F): 98.7 Respiratory Rate(breaths/min): 18 Photos: [N/A:N/A] Wound Location: Left Lower Leg - Lateral N/A N/A Wounding Event: Gradually Appeared N/A N/A Primary Etiology: Pyoderma N/A N/A Comorbid History: Sleep Apnea, Hypertension, Colitis N/A N/A Date Acquired: 11/18/2015 N/A N/A Weeks of Treatment: 140 N/A N/A Wound Status: Open N/A N/A Measurements L x W x D (cm) 7.2x6.7x0.9  N/A N/A Area (cm) : 37.888 N/A N/A Volume (cm) : 34.099 N/A N/A % Reduction in Area: -671.80% N/A N/A % Reduction in Volume: -768.30% N/A N/A Classification: Full Thickness With Exposed N/A N/A Support Structures Exudate Amount: Medium N/A N/A Exudate Type: Serous N/A N/A Exudate Color: amber N/A N/A Wound Margin: Epibole N/A N/A Granulation Amount: None Present (0%) N/A N/A Necrotic Amount: Large (67-100%) N/A N/A Exposed Structures: Fat Layer (Subcutaneous Tissue) N/A N/A Exposed: Yes Muscle: Yes Fascia: No Tendon: No Joint: No Bone: No Epithelialization: None N/A N/A Treatment Notes Electronic Signature(s) Signed: 08/16/2019 9:06:41 AM By: Army Melia Entered By: Army Melia on 08/16/2019 08:47:07 [name] ([id number]) -------------------------------------------------------------------------------- Multi-Disciplinary Care Plan Details Patient Name: [name] Date of Service: 08/16/2019 8:15 AM Medical Record Number: [id number] Patient Account Number: [id number] Date of Birth/Sex: May 08, 1981 (39 y.o. M) Treating RN: Army Melia Primary Care Telly Broberg: [name] Other Clinician: Referring Cataleya Cristina: [name] Treating Rajan Burgard/Extender: Melburn Hake, [name]: 140 Active Inactive Venous Leg Ulcer Nursing Diagnoses: Knowledge deficit related to disease process and management Potential for venous  Insuffiency (use before diagnosis confirmed) Goals: Non-invasive venous studies are completed as ordered Date Initiated: 12/11/2016 Target Resolution Date: 03/13/2017 Goal Status: Active Patient will maintain optimal edema control Date Initiated: 12/11/2016 Target Resolution Date: 03/13/2017 Goal Status: Active Patient/caregiver will verbalize understanding of disease process and disease management Date Initiated: 12/11/2016 Target Resolution Date: 03/13/2017 Goal Status: Active Verify adequate tissue perfusion prior to therapeutic compression application Date Initiated: 12/11/2016 Target Resolution Date: 03/13/2017 Goal Status: Active Interventions: Assess peripheral edema status every visit. Compression as ordered Provide education on venous insufficiency Treatment Activities: Therapeutic compression applied : 12/11/2016 Notes: Wound/Skin Impairment Nursing Diagnoses: Impaired tissue integrity Knowledge deficit related to ulceration/compromised skin integrity Goals: Patient/caregiver will verbalize understanding of skin care regimen Date Initiated: 12/11/2016 Target Resolution Date: 03/13/2017 Goal Status: Active Ulcer/skin breakdown will have a volume reduction of 30% by week 4 Date Initiated: 12/11/2016 Target Resolution Date: 03/13/2017 Goal Status: Active Ulcer/skin breakdown will have a volume reduction of 50% by week 8 Date Initiated: 12/11/2016 Target Resolution Date: 03/13/2017 Goal Status: Active Ulcer/skin breakdown will have a volume reduction of 80% by week 12 Date Initiated: 12/11/2016 Target Resolution Date: 03/13/2017 [name], [name] ([id number]) Goal Status: Active Ulcer/skin breakdown will heal within 14 weeks Date Initiated: 12/11/2016 Target Resolution Date: 03/13/2017 Goal Status: Active Interventions: Assess patient/caregiver ability to obtain necessary supplies Assess patient/caregiver ability to perform ulcer/skin care regimen upon admission and as  needed Assess ulceration(s) every visit Provide education on ulcer and skin care Treatment Activities: Skin care regimen initiated : 12/11/2016 Topical wound management initiated : 12/11/2016 Notes: Electronic Signature(s) Signed: 08/16/2019 9:06:41 AM By: Army Melia Entered By: Army Melia on 08/16/2019 08:46:46 [name], [name] ([id number]) -------------------------------------------------------------------------------- Pain Assessment Details Patient Name: [name] Date of Service: 08/16/2019 8:15 AM Medical Record Number: [id number] Patient Account Number: [id number] Date of Birth/Sex: 02-May-1981 (39 y.o. M) Treating RN: [name] Primary Care Shaniece Bussa: [name] Other Clinician: Referring Leidi Astle: [name] Treating Chyane Greer/Extender: Melburn Hake, [name]: 140 Active Problems Location of Pain Severity and Description of Pain Patient Has Paino No Site Locations Pain Management and Medication Current Pain Management: Notes Patient denies pain a this time. Electronic Signature(s) Signed: 08/18/2019 11:21:31 AM By: [name], [name], RN, [name], [name], [name] Entered By: [name], [name], RN, [name], [name] on 08/16/2019 08:17:56 [name] ([id number]) -------------------------------------------------------------------------------- Patient/Caregiver Education Details Patient Name: [name] Date of Service: 08/16/2019 8:15 AM Medical  Record Number: [id number] Patient Account Number: [id number] Date of Birth/Gender: January 14, 1981 (39 y.o. M) Treating RN: Army Melia Primary Care Physician: [name] Other Clinician: Referring Physician: [name] Treating Physician/Extender: Melburn Hake, [name]: 140 Education Assessment Education Provided To: Patient Education Topics Provided Wound/Skin Impairment: Handouts: Caring for Your Ulcer Methods: Demonstration, Explain/Verbal Responses: State content correctly Electronic  Signature(s) Signed: 08/16/2019 9:06:41 AM By: Army Melia Entered By: Army Melia on 08/16/2019 08:49:12 [name], [name] ([id number]) -------------------------------------------------------------------------------- Wound Assessment Details Patient Name: [name] Date of Service: 08/16/2019 8:15 AM Medical Record Number: [id number] Patient Account Number: [id number] Date of Birth/Sex: 06-16-1980 (39 y.o. M) Treating RN: [name] Primary Care Alexey Rhoads: [name] Other Clinician: Referring Philip Eckersley: [name] Treating Shantia Sanford/Extender: Melburn Hake, [name]: 140 Wound Status Wound Number: 1 Primary Etiology: Pyoderma Wound Location: Left Lower Leg - Lateral Wound Status: Open Wounding Event: Gradually Appeared Comorbid History: Sleep Apnea, Hypertension, Colitis Date Acquired: 11/18/2015 Weeks Of Treatment: 140 Clustered Wound: No Photos Wound Measurements Length: (cm) 7.2 Width: (cm) 6.7 Depth: (cm) 0.9 Area: (cm) 37.888 Volume: (cm) 34.099 % Reduction in Area: -671.8% % Reduction in Volume: -768.3% Epithelialization: None Tunneling: No Undermining: No Wound Description Classification: Full Thickness With Exposed Support Structur Wound Margin: Epibole Exudate Amount: Medium Exudate Type: Serous Exudate Color: amber es Foul Odor After Cleansing: No Slough/Fibrino Yes Wound Bed Granulation Amount: None Present (0%) Exposed Structure Necrotic Amount: Large (67-100%) Fascia Exposed: No Necrotic Quality: Adherent Slough Fat Layer (Subcutaneous Tissue) Exposed: Yes Tendon Exposed: No Muscle Exposed: Yes Necrosis of Muscle: No Joint Exposed: No Bone Exposed: No Treatment Notes Wound #1 (Left, Lateral Lower Leg) Notes plain collagen, xsorb, tape tubi gripF Electronic Signature(s) [name], [name] ([id number]) Signed: 08/18/2019 11:21:31 AM By: [name], [name], RN, [name], [name], [name] Entered By: [name], [name], RN, [name], [name] on 08/16/2019  08:22:09 [name] ([id number]) -------------------------------------------------------------------------------- [place] Details Patient Name: [name] Date of Service: 08/16/2019 8:15 AM Medical Record Number: [id number] Patient Account Number: [id number] Date of Birth/Sex: [birth year] (40 y.o. M) Treating RN: Army Melia Primary Care Kaspar Albornoz: [name] Other Clinician: Referring Hodan Wurtz: [name] Treating Sabre Romberger/Extender: Melburn Hake, [name]: 140 Vital Signs Time Taken: 08:15 Temperature (F): 98.7 Height (in): 71 Pulse (bpm): 81 Weight (lbs): 338 Respiratory Rate (breaths/min): 18 Body Mass Index (BMI): 47.1 Blood Pressure (mmHg): 136/81 Reference Range: 80 - 120 mg / dl Electronic Signature(s) Signed: 08/16/2019 9:06:41 AM By: Army Melia Entered By: Army Melia on 08/16/2019 08:46:38

## 2019-08-23 ENCOUNTER — Other Ambulatory Visit: Payer: Self-pay

## 2019-08-23 ENCOUNTER — Encounter: Payer: 59 | Attending: Physician Assistant | Admitting: Physician Assistant

## 2019-08-23 ENCOUNTER — Other Ambulatory Visit
Admission: RE | Admit: 2019-08-23 | Discharge: 2019-08-23 | Disposition: A | Discharge status: Home / Self Care | Payer: 59 | Source: Ambulatory Visit | Attending: Physician Assistant | Admitting: Physician Assistant

## 2019-08-23 DIAGNOSIS — L03116 Cellulitis of left lower limb: Secondary | ICD-10-CM | POA: Diagnosis not present

## 2019-08-23 DIAGNOSIS — G4730 Sleep apnea, unspecified: Secondary | ICD-10-CM | POA: Diagnosis not present

## 2019-08-23 DIAGNOSIS — Z79899 Other long term (current) drug therapy: Secondary | ICD-10-CM | POA: Diagnosis not present

## 2019-08-23 DIAGNOSIS — I10 Essential (primary) hypertension: Secondary | ICD-10-CM | POA: Insufficient documentation

## 2019-08-23 DIAGNOSIS — K5190 Ulcerative colitis, unspecified, without complications: Secondary | ICD-10-CM | POA: Diagnosis not present

## 2019-08-23 DIAGNOSIS — L97222 Non-pressure chronic ulcer of left calf with fat layer exposed: Secondary | ICD-10-CM | POA: Insufficient documentation

## 2019-08-23 DIAGNOSIS — Z6841 Body Mass Index (BMI) 40.0 and over, adult: Secondary | ICD-10-CM | POA: Diagnosis not present

## 2019-08-23 DIAGNOSIS — Z91011 Allergy to milk products: Secondary | ICD-10-CM | POA: Diagnosis not present

## 2019-08-23 DIAGNOSIS — Z881 Allergy status to other antibiotic agents status: Secondary | ICD-10-CM | POA: Diagnosis not present

## 2019-08-23 DIAGNOSIS — E669 Obesity, unspecified: Secondary | ICD-10-CM | POA: Insufficient documentation

## 2019-08-23 DIAGNOSIS — Z888 Allergy status to other drugs, medicaments and biological substances status: Secondary | ICD-10-CM | POA: Diagnosis not present

## 2019-08-23 DIAGNOSIS — L97213 Non-pressure chronic ulcer of right calf with necrosis of muscle: Secondary | ICD-10-CM | POA: Diagnosis not present

## 2019-08-23 DIAGNOSIS — F17210 Nicotine dependence, cigarettes, uncomplicated: Secondary | ICD-10-CM | POA: Diagnosis not present

## 2019-08-23 DIAGNOSIS — B999 Unspecified infectious disease: Secondary | ICD-10-CM | POA: Diagnosis not present

## 2019-08-23 DIAGNOSIS — Z7952 Long term (current) use of systemic steroids: Secondary | ICD-10-CM | POA: Insufficient documentation

## 2019-08-23 DIAGNOSIS — I872 Venous insufficiency (chronic) (peripheral): Secondary | ICD-10-CM | POA: Insufficient documentation

## 2019-08-23 DIAGNOSIS — L88 Pyoderma gangrenosum: Secondary | ICD-10-CM | POA: Insufficient documentation

## 2019-08-23 NOTE — Progress Notes (Addendum)
[name], PAREDEZ ([id number]) Visit Report for 08/23/2019 Chief Complaint Document Details Patient Name: [name], [name] Date of Service: 08/23/2019 8:15 AM Medical Record Number: [id number] Patient Account Number: [id number] Date of Birth/Sex: Jan 04, 1981 (39 y.o. 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 M) Treating RN: Army Melia Primary Care Provider: [name] Other Clinician: Referring Provider: [name] Treating Provider/Extender: Melburn Hake, Vadis Slabach Weeks in Treatment: 141 History of Present Illness HPI Description: 12/04/16; 39 year old man who comes into the clinic today for review of a wound on the posterior left calf. He tells me that is been there for about a year. He is not a diabetic he does smoke half a pack per day. He was seen in the ER on 11/20/16 felt to have cellulitis around the wound and was given clindamycin. An x-ray did not show osteomyelitis. The patient initially tells me that he has a milk allergy that sets off a pruritic itching rash on his lower legs which she scratches incessantly and he thinks that's what may have set up the wound. He has been using various topical antibiotics and ointments without any effect. He works in a [employer] and is on his feet all day. He does not have a prior  history of wounds however he does have the rash on both lower legs the right arm and the ventral aspect of his left arm. These are excoriations and clearly have had scratching however there are of macular looking areas on both legs including a substantial larger area on the right leg. This does not have an underlying open area. There is no blistering. The patient tells me that 2 years ago in [place] in response to the rash on his legs he saw a dermatologist who told him he had a condition which may be pyoderma gangrenosum although I may be putting words into his mouth. He seemed to recognize this. 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 He is not in any treatment for this. He's had no recent travel 12/11/16; the patient arrives today with his wound and roughly the same condition we've been using silver alginate this is a deep punched out wound with some surrounding erythema but no tenderness. Biopsy I did did not show confirmed pyoderma gangrenosum suggested nonspecific inflammation and vasculitis but does not provide an actual description of what was seen by the pathologist. I'm really not able to understand this We have also received information from the patient's dermatologist in [place] notes from April 2016. This was a doctor Agarwal-antal. 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 He was prescribed topical steroid high potency under occlusion which helped but at this point the patient did not have a deep punched out wound. 12/18/16; the patient's wound is larger in terms of surface area however this surface looks better and there is less depth. The surrounding erythema also is better. The patient states that the wrap we put on came off 2 days ago when he has been using his  compression stockings. He we are in the process of getting a dermatology consult. 12/26/16 on evaluation today patient's left lower extremity wound shows evidence of  infection with surrounding erythema noted. He has been tolerating the dressing changes but states that he has noted more discomfort. There is a larger area of erythema surrounding the wound. No fevers, chills, nausea, or vomiting noted at this time. With that being said the wound still does have slough covering the surface. He is not allergic to any medication that he is aware of at this point. In regard to his right lower extremity he had several regions that are erythematous and pruritic he wonders if there's anything we can do to help that. 01/02/17 I reviewed patient's wound culture which was obtained his visit last week. He was placed on doxycycline at that point. Unfortunately that does not appear to be an antibiotic that would likely help with the situation however the pseudomonas noted on culture is sensitive to Cipro. Also unfortunately patient's wound seems to have a large compared to last week's evaluation. Not severely so but there are definitely increased measurements in general. He is continuing to have discomfort as well he writes this to be a seven out of 10. In fact he would prefer me not to perform any debridement today due to the fact that he is having discomfort and considering he has an active infection on the little reluctant to do so anyway. No fevers, chills, nausea, or vomiting noted at this time. 01/08/17; patient seems dermatology on September 5. I suspect dermatology will want the slides from the biopsy I did sent to their pathologist. I'm not sure if there is a way we can expedite that. In any case the culture I did before I left on vacation 3 weeks ago showed Pseudomonas he was given 10 days of Cipro and per her description of her intake nurses is actually somewhat better this week although the wound is quite a bit bigger than I remember the last time I saw this. He still has 3 more days of Cipro 01/21/17; dermatology appointment tomorrow. He has completed the ciprofloxacin  for Pseudomonas. Surface of the wound looks better however he is had some deterioration in the lesions on his right leg. Meantime the left lateral leg wound we will continue with sample 01/29/17; patient had his dermatology appointment but I can't yet see that note. He is completed his antibiotics. The wound is more superficial but considerably larger in circumferential area than when he came in. This is in his left lateral calf. He also has swollen erythematous areas with superficial wounds on the right leg and small papular areas on both arms. There apparently areas in her his upper thighs and buttocks I did not look at those. Dermatology biopsied the right leg. Hopefully will have their input next week. 02/05/17; patient went back to see his dermatologist who told him that he had a "scratching problem" as well as staph. He is now on a 30 day course of doxycycline and I believe she gave him triamcinolone cream to the right leg areas to help with the itching [not exactly sure but probably triamcinolone]. She apparently looked at the left lateral leg wound although this was not rebiopsied and I think felt to be ultimately part of the same pathogenesis. He is using sample border foam and changing nevus himself. He now has a new open area on the right posterior leg which was his biopsy site I don't have any of the  dermatology notes 02/12/17; we put the patient in compression last week with SANTYL to the wound on the left leg and the biopsy. Edema is much better and the depth of the wound is now at level of skin. Area is still the same oBiopsy site on the right lateral leg we've also been using santyl with a border foam dressing and he is changing this himself. 02/19/17; Using silver alginate started last week to both the substantial left leg wound and the biopsy site on the right wound. He is tolerating compression well. Has a an appointment with his primary M.D. tomorrow wondering about diuretics although  I'm wondering if the edema problem is AADAM, ZHEN ([id number]) actually lymphedema 02/26/17; the patient has been to see his primary doctor Dr. Jerrel Ivory at [facility] our primary care. She started him on Lasix 20 mg and this seems to have helped with the edema. However we are not making substantial change with the left lateral calf wound and inflammation. The biopsy site on the right leg also looks stable but not really all that different. 03/12/17; the patient has been to see vein and vascular Dr. Lucky Cowboy. He has had venous reflux studies I have not reviewed these. I did get a call from his dermatology office. 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 They therefore recommended clobetasol and tetracycline i.e. 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 03/26/17; apparently the patient just had reflux studies not an appointment with Dr. dew. She arrives in clinic today having applied clobetasol for 2-3 weeks. He notes over the last 2-3 days excessive drainage having to change the dressing 3-4 times a day and also expanding erythema. He states the expanding erythema seems to come and go and was last this red was earlier in the month.he is on doxycycline 150 mg twice a day as an anti- inflammatory systemic therapy for possible pyoderma gangrenosum along with the topical clobetasol 04/02/17; the patient was seen last week by Dr. Lillia Carmel at [facility] dermatology locally who kindly saw him at my request. 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 My concern was the degree of erythema medially extending from his left leg wound which was either inflammation from pyoderma  or cellulitis. I put him on Augmentin however culture of the wound showed Pseudomonas which is quinolone sensitive. I really don't believe he has cellulitis however in view of everything I will continue and give him a course of Cipro. He is also on doxycycline as an immune modulator for the pyoderma. In addition to his original wound on the left lateral leg with surrounding erythema he has a wound on the right posterior calf which was an original biopsy site done by dermatology. 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 Saw Dr. Lillia Carmel yesterday. He has been using topical antibiotics to both wound areas his original wound on the left and the biopsies/pathergy area on the right. There is definitely some improvement in the inflammation around the wound on the right although the patient states he has increasing sensitivity of the wounds. He is on prednisone 60 and doxycycline 1 as prescribed by Dr. Lillia Carmel. He is covering the topical  antibiotic with gauze and putting this in his own compression stocks and changing this daily. He states that Dr. Lottie Rater did a culture of the left leg wound yesterday 05/07/17; pyoderma gangrenosum. The patient saw Dr. Lillia Carmel yesterday and has a follow-up with her in one month. He is still using topical antibiotics to both wounds although he can't recall exactly what type. He is still on prednisone 60 mg. Dr. Lillia Carmel stated that the doxycycline could stop if we were in agreement. 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 The right medial leg was induced by biopsy/pathergy. The area on the right is essentially healed. 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 The right medial leg has closed and remains closed. He  is still on prednisone 60. oHe tells me he missed his last dermatology appointment with Dr. Lillia Carmel but will make another appointment. He reports that her blood sugar at a recent screen in [place] was high 200's. He was 180 today. 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 I think he is going to require still much longer prednisone perhaps another 3 months before attempting to taper. In the meantime his wound is a lot better. Smaller. He is cleaning this off daily and applying topical antibiotics. When he was last in the clinic I thought about changing to [facility] and actually put in a couple of calls to dermatology although probably not during their business hours. 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 He continues on oral prednisone. He has been using triple antibiotic ointment. There is surface debris and we will transition to [facility] and have him return in 2 weeks. He has lost 30 pounds since his last appointment with lifestyle modification. He may benefit from topical steroid cream for treatment this can be considered at a later date. 08/22/17 on evaluation today patient appears to actually be doing rather well in regard to his left lateral lower extremity ulcer. He has actually been managed by Dr. Dellia Nims most recently. Patient is currently on oral steroids at this time. This seems to have been of benefit for him. Nonetheless his last visit was actually with Leah on 08/06/17. Currently he is not utilizing any topical steroid creams although this could be of benefit as well. No fevers, chills, nausea, or vomiting noted at this time. 09/05/17 on evaluation today patient appears to be doing better in regard to his left lateral lower extremity ulcer. He has been tolerating the dressing changes without complication. He is using Santyl with good effect. Overall I'm  very pleased with how things are standing at this point. Patient likewise is happy that this is doing better. 09/19/17 on evaluation today patient actually appears to be doing rather well in regard to his left lateral lower extremity ulcer. 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 He's not having any significant pain. 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 He has been tolerating the Santyl without complication and in general I feel like he's making good progress. 10/17/17 on  evaluation today patient appears to be doing very well in regard to his left lateral lower surety ulcer. He has been tolerating the dressing changes without complication. There does not appear to be any evidence of infection he's alternating the Santyl and the triple antibiotic ointment every other day this seems to be doing well for him. 11/03/17 on evaluation today patient appears to be doing very well in regard to his left lateral lower extremity ulcer. He is been tolerating the dressing changes without complication which is good news. Fortunately there does not appear to be any evidence of infection which is also great news. Overall is doing excellent they are starting to taper down on the prednisone is down to 40 mg at this point it also started topical clobetasol for him. 11/17/17 on evaluation today patient appears to be doing well in regard to his left lateral lower surety ulcer. He's been tolerating the dressing changes without complication. He does note that he is having no pain, no excessive drainage or discharge, and overall he feels like things are going about how he would expect and hope they would. Overall he seems to have no evidence of infection at this time in my opinion which is good news. 12/04/17-He is seen in follow-up  evaluation for right lateral lower extremity ulcer. He has been applying topical steroid cream. Today's measurement show slight increase in size. Over the next 2 weeks we will transition to every other day Santyl and steroid cream. He has been encouraged to monitor for changes and notify clinic with any concerns 12/15/17 on evaluation today patient's left lateral motion the ulcer and fortunately is doing worse again at this point. This just since last week to this Diamond ([id number]) week has close to doubled in size according to the patient. I did not seeing last week's I do not have a visual to compare this to in our system was also down so we do not have all the charts and at this point. Nonetheless it does have me somewhat concerned in regard to the fact that again he was worried enough about it he has contact the dermatology that placed them back on the full strength, 50 mg a day of the prednisone that he was taken previous. He continues to alternate using clobetasol along with Santyl at this point. He is obviously somewhat frustrated. 12/22/17 on evaluation today patient appears to be doing a little worse compared to last evaluation. Unfortunately the wound is a little deeper and slightly larger than the last week's evaluation. With that being said he has made some progress in regard to the irritation surrounding at this time unfortunately despite that progress that's been made he still has a significant issue going on here. 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 For that reason I discussed with him today the possibility of perform a wound culture to ensure there's nothing overtly infected. 01/06/18 on evaluation today patient's wound is larger and deeper than previously evaluated. With that being said it did appear that his wound  was infected after my last evaluation with him. Subsequently I did end up prescribing a prescription for Bactrim DS which she has been taking and having no complication with. Fortunately there does not appear to be any evidence of infection at this point in time as far as anything spreading, no want to touch, and overall  I feel like things are showing signs of improvement. 01/13/18 on evaluation today patient appears to be even a little larger and deeper than last time. There still muscle exposed in the base of the wound. Nonetheless he does appear to be less erythematous I do believe inflammation is calming down also believe the infection looks like it's probably resolved at this time based on what I'm seeing. No fevers, chills, nausea, or vomiting noted at this time. 01/30/18 on evaluation today patient actually appears to visually look better for the most part. Unfortunately those visually this looks better he does seem to potentially have what may be an abscess in the muscle that has been noted in the central portion of the wound. This is the first time that I have noted what appears to be fluctuance in the central portion of the muscle. With that being said I'm somewhat more concerned about the fact that this might indicate an abscess formation at this location. I do believe that an ultrasound would be appropriate. This is likely something we need to try to do as soon as possible. He has been switch to mupirocin ointment and he is no longer using the steroid ointment as prescribed by dermatology he sees them again next week he's been decreased from 60 to 40 mg of prednisone. 03/09/18 on evaluation today patient actually appears to be doing a little better compared to last time I saw him. There's not as much erythema surrounding the wound itself. He I did review his most recent infectious disease note which was dated 02/24/18. He saw Dr. Michel Bickers in [place]. 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 Patient is now off of antibiotics and they are continually observing this. There seems to be no change in the past two weeks in my pinion based on what the patient says and what I see today compared to what Dr. Megan Salon likely saw two weeks ago. No fevers, chills, nausea, or vomiting noted at this time. 03/23/18 on evaluation today patient's wound actually appears to be showing signs of improvement which is good news. He is currently still on the Dapsone. He is also working on tapering the prednisone to get off of this and Dr. Lottie Rater is working with him in this regard. Nonetheless overall I feel like the wound is doing well it does appear based on the infectious disease note that I reviewed from Dr. 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 I did also review the results of his ultrasound of left lower extremity which revealed there was a dentist tissue in the base of the wound without an abscess noted. 04/06/18 on evaluation today the patient's left lateral lower extremity ulcer actually appears to be doing fairly well which is excellent news. There does not appear to be any evidence of infection at this time which is also great news. Overall he still does have a significantly large ulceration although little by little he seems to be making progress. He is down to 10 mg a day of the prednisone. 04/20/18 on evaluation today patient actually appears to be doing excellent at this time in regard to his left lower extremity ulcer. He's making signs of good progress unfortunately this is taking much longer than we would really like to see but  nonetheless he is making progress. Fortunately there does not appear to be any evidence of infection at this time. No fevers, chills, nausea, or vomiting noted  at this time. The patient has not been using the Santyl due to the cost he hadn't got in this field yet. He's mainly been using the antibiotic ointment topically. Subsequently he also tells me that he really has not been scrubbing in the shower I think this would be helpful again as I told him it doesn't have to be anything too aggressive to even make it believe just enough to keep it free of some of the loose slough and biofilm on the wound surface. 05/11/18 on evaluation today patient's wound appears to be making slow but sure progress in regard to the left lateral lower extremity ulcer. He is been tolerating the dressing changes without complication. Fortunately there does not appear to be any evidence of infection at this time. He is still just using triple antibiotic ointment along with clobetasol occasionally over the area. He never got the Santyl and really does not seem to intend to in my pinion. 06/01/18 on evaluation today patient appears to be doing a little better in regard to his left lateral lower extremity ulcer. He states that overall he does not feel like he is doing as well with the Dapsone as he did with the prednisone. Nonetheless he sees his dermatologist later today and is gonna talk to them about the possibility of going back on the prednisone. Overall again I believe that the wound would be better if you would utilize Santyl but he really does not seem to be interested in going back to the [place] at this point. He has been using triple antibiotic ointment. 06/15/18 on evaluation today patient's wound actually appears to be doing about the same at this point. Fortunately there is no signs of infection at this time. He has made slight improvements although he continues to not really want to clean the wound bed at this point. He states that he just doesn't mess with it he doesn't want to cause any problems with everything else he has going on. He has been on medication, antibiotics as  prescribed by his dermatologist, for a staff infection of his lower extremities which is really drying out now and looking much better he tells me. Fortunately there is no sign of overall infection. 06/29/18 on evaluation today patient appears to be doing well in regard to his left lateral lower surety ulcer all things considering. Fortunately his staff infection seems to be greatly improved compared to previous. He has no signs of infection and this is drying up quite nicely. He is still the doxycycline for this is no longer on cental, Dapsone, or any of the other medications. His dermatologist has recommended possibility of an infusion but right now he does not want to proceed with that. 07/13/18 on evaluation today patient appears to be doing about the same in regard to his left lateral lower surety ulcer. Fortunately there's no signs of infection at this time which is great news. Unfortunately he still builds up a significant amount of Slough/biofilm of the surface of the wound he still is not Antonellis, Geoffery ([id number]) really cleaning this as he should be appropriately. Again I'm able to easily with saline and gauze remove the majority of this on the surface which if you would do this at home would likely be a dramatic improvement for him as far as getting the area to improve. Nonetheless  overall I still feel like he is making progress is just very slow. I think Santyl will be of benefit for him as well. Still he has not gotten this as of this point. 07/27/18 on evaluation today patient actually appears to be doing little worse in regards of the erythema around the periwound region of the wound he also tells me that he's been having more drainage currently compared to what he was experiencing last time I saw him. He states not quite as bad as what he had because this was infected previously but nonetheless is still appears to be doing poorly. Fortunately there is no evidence of systemic infection at  this point. The patient tells me that he is not going to be able to afford the Santyl. He is still waiting to hear about the infusion therapy with his dermatologist. Apparently she wants an updated colonoscopy first. 08/10/18 on evaluation today patient appears to be doing better in regard to his left lateral lower extremity ulcer. Fortunately he is showing signs of improvement in this regard he's actually been approved for Remicade infusion's as well although this has not been scheduled as of yet. Fortunately there's no signs of active infection at this time in regard to the wound although he is having some issues with infection of the right lower extremity is been seen as dermatologist for this. Fortunately they are definitely still working with him trying to keep things under control. 09/07/18 on evaluation today patient is actually doing rather well in regard to his left lateral lower extremity ulcer. He notes these actually having some hair grow back on his extremity which is something he has not seen in years. He also tells me that the pain is really not giving them any trouble at this time which is also good news overall she is very pleased with the progress he's using a combination of the mupirocin along with the probate is all mixed. 09/21/18 on evaluation today patient actually appears to be doing fairly well all things considered in regard to his looks from the ulcer. He's been tolerating the dressing changes without complication. Fortunately there's no signs of active infection at this time which is good news he is still on all antibiotics or prevention of the staff infection. He has been on prednisone for time although he states it is gonna contact his dermatologist and see if she put them on a short course due to some irritation that he has going on currently. Fortunately there's no evidence of any overall worsening this is going very slow I think cental would be something that would be  helpful for him although he states that $50 for tube is quite expensive. He therefore is not willing to get that at this point. 10/06/18 on evaluation today patient actually appears to be doing decently well in regard to his left lateral leg ulcer. He's been tolerating the dressing changes without complication. Fortunately there's no signs of active infection at this time. Overall I'm actually rather pleased with the progress he's making although it's slow he doesn't show any signs of infection and he does seem to be making some improvement. I do believe that he may need a switch up and dressings to try to help this to heal more appropriately and quickly. 10/19/18 on evaluation today patient actually appears to be doing better in regard to his left lateral lower extremity ulcer. This is shown signs of having much less Slough buildup at this point due to the fact he  has been using the Santyl. Obviously this is very good news. The overall size of the wound is not dramatically smaller but again the appearance is. 11/02/18 on evaluation today patient actually appears to be doing quite well in regard to his lower [place] ulcer. A lot of the skin around the ulcer is actually somewhat irritating at this point this seems to be more due to the dressing causing irritation from the adhesive that anything else. Fortunately there is no signs of active infection at this time. 11/24/18 on evaluation today patient appears to be doing a little worse in regard to his overall appearance of his lower extremity ulcer. There's more erythema and warmth around the wound unfortunately. He is currently on doxycycline which he has been on for some time. With that being said I'm not sure that seems to be helping with what appears to possibly be an acute cellulitis with regard to his left lower extremity ulcer. No fevers, chills, nausea, or vomiting noted at this time. 12/08/18 on evaluation today patient's wounds actually appears to  be doing significantly better compared to his last evaluation. He has been using Santyl along with alternating tripling about appointment as well as the steroid cream seems to be doing quite well and the wound is showing signs of improvement which is excellent news. Fortunately there's no evidence of infection and in fact his culture came back negative with only normal skin flora noted. 12/21/2018 upon evaluation today patient actually appears to be doing excellent with regard to his ulcer. This is actually the best that I have seen it since have been helping to take care of him. It is both smaller as well as less slough noted on the surface of the wound and seems to be showing signs of good improvement with new skin growing from the edges. He has been using just the triamcinolone he does wonder if he can get a refill of that ointment today. 01/04/2019 upon evaluation today patient actually appears to be doing well with regard to his left lateral lower extremity ulcer. With that being said it does not appear to be that he is doing quite as well as last time as far as progression is concerned. There does not appear to be any signs of infection or significant irritation which is good news. With that being said I do believe that he may benefit from switching to a collagen based dressing based on how clean The wound appears. 01/18/2019 on evaluation today patient actually appears to be doing well with regard to his wound on the left lower extremity. He is not made a lot of progress compared to where we were previous but nonetheless does seem to be doing okay at this time which is good news. There is no signs of active infection which is also good news. My only concern currently is I do wish we can get him into utilizing the collagen dressing his insurance would not pay for the supplies that we ordered although it appears that he may be able to order this through his supply company that he typically utilizes.  This is [employer]. Nonetheless he did try to order it during the office visit today and it appears this did go through. We will see if he can get that it is a different brand but nonetheless he has collagen and I do think will be beneficial. 02/01/2019 on evaluation today patient actually appears to be doing a little worse today in regard to the overall size of his  wounds. Fortunately there is no signs of active infection at this time. That is visually. Nonetheless when this is happened before it was due to infection. For that reason were somewhat concerned about that this time as well. 02/08/2019 on evaluation today patient unfortunately appears to be doing slightly worse with regard to his wound upon evaluation today. Is measuring a Schuld, Dauntae ([id number]) little deeper and a little larger unfortunately. I am not really sure exactly what is causing this to enlarge he actually did see his dermatologist she is going to see about initiating Humira for him. Subsequently she also did do steroid injections into the wound itself in the periphery. Nonetheless still nonetheless he seems to be getting a little bit larger he is gone back to just using the steroid cream topically which I think is appropriate. I would say hold off on the collagen for the time being is definitely a good thing to do. Based on the culture results which we finally did get the final result back regarding it shows staph as the bacteria noted again that can be a normal skin bacteria based on the fact however he is having increased drainage and worsening of the wound measurement wise I would go ahead and place him on an antibiotic today I do believe for this. 02/15/2019 on evaluation today patient actually appears to be doing somewhat better in regard to his ulcer. There is no signs of worsening at this time I did review his culture results which showed evidence of Staphylococcus aureus but not MRSA. Again this could just be more related  to the normal skin bacteria although he states the drainage has slowed down quite a bit he may have had a mild infection not just colonization. And was much smaller and then since around10/04/2019 on evaluation today patient appears to be doing unfortunately worse as far as the size of the wound. I really feel like that this is steadily getting larger again it had been doing excellent right at the beginning of September we have seen a steady increase in the area of the wound it is almost 2-1/2 times the size it was on September 1. Obviously this is a bad trend this is not wanting to see. For that reason we went back to using just the topical triamcinolone cream which does seem to help with inflammation. I checked him for bacteria by way of culture and nothing showed positive there. I am considering giving him a short course of a tapering steroid Dosepak today to see if that is can be beneficial for him. The patient is in agreement with giving that a try. 03/08/2019 on evaluation today patient appears to be doing very well in comparison to last evaluation with regard to his lower extremity ulcer. This is showing signs of less inflammation and actually measuring slightly smaller compared to last time every other week over the past month and a half he has been measuring larger larger larger. Nonetheless I do believe that the issue has been inflammation the prednisone does seem to have been beneficial for him which is good news. No fevers, chills, nausea, vomiting, or diarrhea. 03/22/2019 on evaluation today patient appears to be doing about the same with regard to his leg ulcer. He has been tolerating the dressing changes without complication. With that being said the wound seems to be mostly arrested at its current size but really is not making any progress except for when we prescribed the prednisone. He did show some signs of dropping  as far as the overall size of the wound during that interval week.  Nonetheless this is something he is not on long-term at this point and unfortunately I think he is getting need either this or else the Humira which his dermatologist has discussed try to get approval for. With that being said he will be seeing his dermatologist on the 11th of this month that is November. 04/19/2019 on evaluation today patient appears to be doing really about the same the wound is measuring slightly larger compared to last time I saw him. He has not been into the office since November 2 due to the fact that he unfortunately had Covid as that his entire family. He tells me that it was rough but they did pull-through and he seems to be doing much better. Fortunately there is no signs of active infection at this time. No fevers, chills, nausea, vomiting, or diarrhea. 05/10/2019 on evaluation today patient unfortunately appears to be doing significantly worse as compared to last time I saw him. He does tell me that he has had his first dose of Humira and actually is scheduled to get the next one in the upcoming week. With that being said he tells me also that in the past several days he has been having a lot of issues with green drainage she showed me a picture this is more blue-green in color. He is also been having issues with increased sloughy buildup and the wound does appear to be larger today. Obviously this is not the direction that we want everything to take based on the starting of his Humira. Nonetheless I think this is definitely a result of likely infection and to be honest I think this is probably Pseudomonas causing the infection based on what I am seeing. 05/24/2019 on evaluation today patient unfortunately appears to be doing significantly worse compared to his prior evaluation with me 2 weeks ago. I did review his culture results which showed that he does have Staph aureus as well as Pseudomonas noted on the culture. Nonetheless the Levaquin that I prescribed for him does  not appear to have been appropriate and in fact he tells me he is no longer experiencing the green drainage and discharge that he had at the last visit. Fortunately there is no signs of active infection at this time which is good news although the wound has significantly worsened it in fact is much deeper than it was previous. We have been utilizing up to this point triamcinolone ointment as the prescription topical of choice but at this time I really feel like that the wound is getting need to be packed in order to appropriately manage this due to the deeper nature of the wound. Therefore something along the lines of an alginate dressing may be more appropriate. 05/31/2019 upon inspection today patient's wound actually showed signs of doing poorly at this point. Unfortunately he just does not seem to be making any good progress despite what we have tried. He actually did go ahead and pick up the Cipro and start taking that as he was noticing more green drainage he had previously completed the Levaquin that I prescribed for him as well. Nonetheless he missed his appointment for the seventh last week on Wednesday with the wound care center and [facility] where his dermatologist referred him. Obviously I do think a second opinion would be helpful at this point especially in light of the fact that the patient seems to be doing so poorly  despite the fact that we have tried everything that I really know how at this point. The only thing that ever seems to have helped him in the past is when he was on high doses of continual steroids that did seem to make a difference for him. 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 06/07/2019 upon evaluation today patient unfortunately appears to be doing worse yet again with regard to his wound. In fact I am  starting to question whether or not he may have a fluid pocket in the muscle at this point based on the bulging and the soft appearance to the central portion of the muscle area. There is not anything draining from the muscle itself at this time which is good news but nonetheless the wound is expanding. I am not really seeing any results of the Humira as far as overall wound progression based on what I am seeing at this point. The patient has been referred for second opinion with regard to his wound to the [facility] wound care center by his dermatologist which I definitely am not in opposition to. Unfortunately we tried multiple dressings in the past including collagen, alginate, and at one point even Hydrofera Blue. With that being said he is never really used it for any significant amount of time due to the fact that he often complains of pain associated with these dressings and then will go back to either using the Santyl which she has done intermittently or more frequently the triamcinolone. He is also using his own compression stockings. We have wrapped him in the past but again that was something else that he really was not a big fan of. 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 He seemed to do much better when he was on the steroids although honestly I understand that the steroids are not necessarily the best medication to be on long-term obviously Siek, Zak ([id number]) 06/14/2019 on evaluation today patient appears to be doing actually a little bit better with regard to the overall appearance with his leg. Unfortunately he does continue to have issues with what appears to be  some fluid underneath the muscle although he did see the wound specialty center at [facility] last week their main goals were to see about infusion therapy in place of the Humira as they feel like that is not quite strong enough. They also recommended that we continue with the treatment otherwise as we are they felt like that was appropriate and they are okay with him continuing to follow-up here with [place] in that regard. With that being said they are also sending him to the vein specialist there to see about vein stripping and if that would be of benefit for him. Subsequently they also did not really address whether or not an ultrasound of the muscle area to see if there is anything that needs to be addressed here would be appropriate or not. For that reason I discussed this with  him last week I think we may proceed down that road at this point. 06/21/2019 upon evaluation today patient's wound actually appears to be doing slightly better compared to previous evaluations. I do believe that he has made a difference with regard to the progression here with the use of oral steroids. Again in the past has been the only thing that is really calm things down. He does tell me that from [facility] is gotten a good news from there that there are no further vein stripping that is necessary at this point. I do not have that available for review today although the patient did relay this to me. He also did obtain and have the ultrasound of the wound completed which I did sign off on today. It does appear that there is no fluid collection under the muscle this is likely then just edematous tissue in general. That is also good news. Overall I still believe the inflammation is the main issue here. He did inquire about the possibility of a wound VAC again with the muscle protruding like it is I am not really sure whether the wound VAC is necessarily ideal or not. That is something we will have to consider although I do believe he may  need compression wrapping to try to help with edema control which could potentially be of benefit. 06/28/2019 on evaluation today patient appears to be doing slightly better measurement wise although this is not terribly smaller he least seems to be trending towards that direction. With that being said he still seems to have purulent drainage noted in the wound bed at this time. He has been on Levaquin followed by Cipro over the past month. Unfortunately he still seems to have some issues with active infection at this time. I did perform a culture last week in order to evaluate and see if indeed there was still anything going on. Subsequently the culture did come back showing Pseudomonas which is consistent with the drainage has been having which is blue-green in color. He also has had an odor that again was somewhat consistent with Pseudomonas as well. Long story short it appears that the culture showed an intermediate finding with regard to how well the Cipro will work for the Pseudomonas infection. Subsequently being that he does not seem to be clearing up and at best what we are doing is just keeping this at [place] I think he may need to see infectious disease to discuss IV antibiotic options. 07/05/2019 upon evaluation today patient appears to be doing okay in regard to his leg ulcer. He has been tolerating the dressing changes at this point without complication. Fortunately there is no signs of active infection at this time which is good news. No fevers, chills, nausea, vomiting, or diarrhea. With that being said he does have an appointment with infectious disease tomorrow and his primary care on Wednesday. Again the reason for the infectious disease referral was due to the fact that he did not seem to be fully resolving with the use of oral antibiotics and therefore we were thinking that IV antibiotic therapy may be necessary secondary to the fact that there was an intermediate finding for how  effective the Cipro may be. Nonetheless again he has been having a lot of purulent and even green drainage. Fortunately right now that seems to have calmed down over the past week with the reinitiation of the oral antibiotic. Nonetheless we will see what Dr. Megan Salon has to say. 07/12/2019 upon evaluation today patient  appears to be doing about the same at this point in regard to his left lower extremity ulcer. Fortunately there is no signs of active infection at this time which is good news I do believe the Levaquin has been beneficial I did review Dr. Hale Bogus note and to be honest I agree that the patient's leg does appear to be doing better currently. What we found in the past as he does not seem to really completely resolve he will stop the antibiotic and then subsequently things will revert back to having issues with blue-green drainage, increased pain, and overall worsening in general. Obviously that is the reason I sent him back to infectious disease. 07/19/2019 upon evaluation today patient appears to be doing roughly the same in size there is really no dramatic improvement. He has started back on the Levaquin at this point and though he seems to be doing okay he did still have a lot of blue/green drainage noted on evaluation today unfortunately. I think that this is still indicative more likely of a Pseudomonas infection as previously noted and again he does see Dr. Megan Salon in just a couple of days. I do not know that were really able to effectively clear this with just oral antibiotics alone based on what I am seeing currently. Nonetheless we are still continue to try to manage as best we can with regard to the patient and his wound. I do think the wrap was helpful in decreasing the edema which is excellent news. No fevers, chills, nausea, vomiting, or diarrhea. 07/26/2019 upon evaluation today patient appears to be doing slightly better with regard to the overall appearance of the muscle  there is no dark discoloration centrally. Fortunately there is no signs of active infection at this time. No fevers, chills, nausea, vomiting, or diarrhea. Patient's wound bed currently the patient did have an appointment with Dr. Megan Salon at infectious disease last week. With that being said Dr. Megan Salon the patient states was still somewhat hesitant about put him on any IV antibiotics he wanted [place] to repeat cultures today and then see where things go going forward. He does look like Dr. Megan Salon because of some improvement the patient did have with the Levaquin wanted [place] to see about repeating cultures. If it indeed grows the Pseudomonas again then he recommended a possibility of considering a PICC line placement and IV antibiotic therapy. He plans to see the patient back in 1 to 2 weeks. 08/02/2019 upon evaluation today patient appears to be doing poorly with regard to his left lower extremity. We did get the results of his culture back it shows that he is still showing evidence of Pseudomonas which is consistent with the purulent/blue-green drainage that he has currently. Subsequently the culture also shows that he now is showing resistance to the oral fluoroquinolones which is unfortunate as that was really the only thing to treat the infection prior. I do believe that he is looking like this is going require IV antibiotic therapy to get this under control. Fortunately there is no signs of systemic infection at this time which is good news. The patient does see Dr. Megan Salon tomorrow. 08/09/2019 upon evaluation today patient appears to be doing better with regard to his left lower extremity ulcer in regard to the overall appearance. He is currently on IV antibiotic therapy. As ordered by Dr. Megan Salon. Currently the patient is on ceftazidime which she is going to take for the next 2 weeks and then follow-up for 4 to 5-week appointment  with Dr. Megan Salon. The patient started this this past Friday  symptoms have not for a total of 3 days currently in full. 08/16/2019 upon evaluation today patient's wound actually does show muscle in the base of the wound but in general does appear to be much better as far as the overall evidence of infection is concerned. In fact I feel like this is for the most part cleared up he still on the IV antibiotics he has not completed the full course yet but I think he is doing much better which is excellent news. ENMANUEL, ZUFALL ([id number]) 08/23/2019 upon evaluation today patient appears to be doing about the same with regard to his wound at this point. He tells me that he still has pain unfortunately. Fortunately there is no evidence of systemic infection at this time which is great news. There is significant muscle protrusion. Electronic Signature(s) Signed: 08/23/2019 1:21:42 PM By: Worthy Keeler PA-C Entered By: Worthy Keeler on 08/23/2019 13:21:42 EBENEZER, MCCASKEY ([id number]) -------------------------------------------------------------------------------- Physical Exam Details Patient Name: [name] Date of Service: 08/23/2019 8:15 AM Medical Record Number: [id number] Patient Account Number: [id number] Date of Birth/Sex: 1981-01-15 (39 y.o. M) Treating RN: Army Melia Primary Care Provider: [name] Other Clinician: Referring Provider: [name] Treating Provider/Extender: Melburn Hake, Heywood Tokunaga Weeks in Treatment: 70 Constitutional Well-nourished and well-hydrated in no acute distress. Respiratory normal breathing without difficulty. Psychiatric this patient is able to make decisions and demonstrates good insight into disease process. Alert and Oriented x 3. pleasant and cooperative. Notes Patient's wound bed currently showed signs of good granulation at this time. Fortunately there is no evidence of active infection which is great news. No fevers, chills, nausea, vomiting, or diarrhea. Electronic Signature(s) Signed: 08/23/2019 1:22:17 PM  By: Worthy Keeler PA-C Entered By: Worthy Keeler on 08/23/2019 13:22:16 TYLLER, BOWLBY ([id number]) -------------------------------------------------------------------------------- Physician Orders Details Patient Name: [name] Date of Service: 08/23/2019 8:15 AM Medical Record Number: [id number] Patient Account Number: [id number] Date of Birth/Sex: 18-Mar-1981 (39 y.o. 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 Anesthetic (add to Medication List) Wound #1 Left,Lateral Lower Leg o Topical Lidocaine 4% cream applied to wound bed prior to debridement (In Clinic Only). Primary Wound Dressing o Collagen - moisten with normal saline Secondary Dressing Wound #1 Left,Lateral Lower Leg o XtraSorb - secure with tape Dressing Change Frequency Wound #1 Left,Lateral Lower Leg o Change dressing every day. Follow-up Appointments Wound #1 Left,Lateral Lower Leg o Return Appointment in 1 week. Edema Control Wound #1 Left,Lateral Lower Leg o Elevate legs to the level of the heart and pump ankles as often as possible - Tubi grip F Laboratory o Bacteria identified in Wound by Culture (MICRO) - Left leg oooo LOINC Code: 8242-3 oooo Convenience Name: Wound culture routine Electronic Signature(s) Signed: 08/23/2019 3:08:20 PM By: Army Melia Signed: 08/27/2019 10:31:16 AM By: Worthy Keeler PA-C Previous Signature: 08/23/2019 9:03:29 AM Version By: Worthy Keeler PA-C Entered By: Army Melia on  08/23/2019 09:24:34 [name] ([id number]) -------------------------------------------------------------------------------- Problem List Details Patient Name: [name] Date of Service: 08/23/2019 8:15 AM Medical Record Number: [id number] Patient Account  Number: [id number] Date of Birth/Sex: April 14, 1981 (39 y.o. M) Treating RN: Army Melia Primary Care Provider: [name] Other Clinician: Referring Provider: [name] Treating Provider/Extender: Melburn Hake, Shields Pautz Weeks in Treatment: 141 Active Problems ICD-10 Evaluated Encounter Code Description Active Date Today Diagnosis L97.222 Non-pressure chronic ulcer of left calf with fat layer exposed 12/04/2016 No Yes L88 Pyoderma gangrenosum 03/26/2017 No Yes I87.2 Venous insufficiency (chronic) (peripheral) 12/04/2016 No Yes L03.116 Cellulitis of left lower limb 05/24/2019 No Yes Inactive Problems ICD-10 Code Description Active Date Inactive Date L97.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 M) Treating RN: Army Melia Primary Care Provider: [name] Other Clinician: Referring Provider: [name] Treating Provider/Extender: Melburn Hake, Eliberto Sole Weeks in Treatment: 141 Subjective Chief Complaint Information obtained from Patient He is here in follow up evaluation for LLE pyoderma ulcer History of Present Illness (HPI) 12/04/16; 39 year old man who comes into the clinic today for review of a wound on the posterior left calf. He  tells me that is been there for about a year. He is not a diabetic he does smoke half a pack per day. He was seen in the ER on 11/20/16 felt to have cellulitis around the wound and was given clindamycin. An x-ray did not show osteomyelitis. The patient initially tells me that he has a milk allergy that sets off a pruritic itching rash on his lower legs which she scratches incessantly and he thinks that's what may have set up the wound. He has been using various topical antibiotics and ointments without any effect. He works in a [employer] and is on his feet all day. He does not have a prior history of wounds however he does have the rash on both lower legs the right arm and the ventral aspect of his left arm. These are excoriations and clearly have had scratching however there are of macular looking areas on both legs including a substantial larger area on the right leg. This does not have an underlying open area. There is no blistering. The patient tells me that 2 years ago in [place] in response to the rash on his legs he saw a dermatologist who told him he had a condition which may be pyoderma gangrenosum although I may be putting words into his mouth. He seemed to recognize this. 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 He is not in any treatment for this. He's had no recent travel 12/11/16; the patient arrives today with his wound and roughly the same condition we've been using silver alginate this is a deep punched out wound with some surrounding erythema but no tenderness. Biopsy I did did not show confirmed pyoderma gangrenosum suggested nonspecific inflammation and vasculitis but does not provide an actual description of what was seen by the pathologist. I'm really not able to understand this We have also received information from the patient's dermatologist in [place] notes from April 2016. This was a doctor Agarwal-antal. 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 He was prescribed topical steroid high potency under occlusion which helped but at this point the patient did not have a deep punched out wound.  12/18/16; the patient's wound is larger in terms of surface area however this surface looks better and there is less depth. The surrounding erythema also is better. The patient states that the wrap we put on came off 2 days ago when he has been using his compression stockings. He we are in the process of getting a dermatology consult. 12/26/16 on evaluation today patient's left lower extremity wound shows evidence of infection with surrounding erythema noted. He has been tolerating the dressing changes but states that he has noted more discomfort. There is a larger area of erythema surrounding the wound. No fevers, chills, nausea, or vomiting noted at this time. With that being said the wound still does have slough covering the surface. He is not allergic to any medication that he is aware of at this point. In regard to his right lower extremity he had several regions that are erythematous and pruritic he wonders if there's anything we can do to help that. 01/02/17 I reviewed patient's wound culture which was obtained his visit last week. He was placed on doxycycline at that point. Unfortunately that does not appear to be an antibiotic that would likely help with the situation however the pseudomonas noted on culture is sensitive to Cipro. Also unfortunately patient's wound seems to have a large compared to last week's evaluation. Not severely so but there are definitely increased measurements in general. He is continuing to have discomfort as well he writes this to be a seven out of 10. In fact he would prefer me not to perform any debridement today due to the fact that he is having discomfort and considering he has an active infection on the little reluctant to do so anyway. No fevers, chills, nausea, or vomiting noted at this  time. 01/08/17; patient seems dermatology on September 5. I suspect dermatology will want the slides from the biopsy I did sent to their pathologist. I'm not sure if there is a way we can expedite that. In any case the culture I did before I left on vacation 3 weeks ago showed Pseudomonas he was given 10 days of Cipro and per her description of her intake nurses is actually somewhat better this week although the wound is quite a bit bigger than I remember the last time I saw this. He still has 3 more days of Cipro 01/21/17; dermatology appointment tomorrow. He has completed the ciprofloxacin for Pseudomonas. Surface of the wound looks better however he is had some deterioration in the lesions on his right leg. Meantime the left lateral leg wound we will continue with sample 01/29/17; patient had his dermatology appointment but I can't yet see that note. He is completed his antibiotics. The wound is more superficial but considerably larger in circumferential area than when he came in. This is in his left lateral calf. He also has swollen erythematous areas with superficial wounds on the right leg and small papular areas on both arms. There apparently areas in her his upper thighs and buttocks I did not look at those. Dermatology biopsied the right leg. Hopefully will have their input next week. 02/05/17; patient went back to see his dermatologist who told him that he had a "scratching problem" as well as staph. He is now on a 30 day course of doxycycline and I believe she gave him triamcinolone cream to the right leg areas to help with the itching [not exactly sure but probably triamcinolone]. She apparently looked at the left lateral leg wound although this was not  rebiopsied and I think felt to be ultimately part of the same pathogenesis. He is using sample border foam and changing nevus himself. He now has a new open area on the right posterior leg which was his biopsy site I don't have any of the  dermatology notes RAVIS, HERNE ([id number]) 02/12/17; we put the patient in compression last week with SANTYL to the wound on the left leg and the biopsy. Edema is much better and the depth of the wound is now at level of skin. Area is still the same Biopsy site on the right lateral leg we've also been using santyl with a border foam dressing and he is changing this himself. 02/19/17; Using silver alginate started last week to both the substantial left leg wound and the biopsy site on the right wound. He is tolerating compression well. Has a an appointment with his primary M.D. tomorrow wondering about diuretics although I'm wondering if the edema problem is actually lymphedema 02/26/17; the patient has been to see his primary doctor Dr. Jerrel Ivory at [facility] our primary care. She started him on Lasix 20 mg and this seems to have helped with the edema. However we are not making substantial change with the left lateral calf wound and inflammation. The biopsy site on the right leg also looks stable but not really all that different. 03/12/17; the patient has been to see vein and vascular Dr. Lucky Cowboy. He has had venous reflux studies I have not reviewed these. I did get a call from his dermatology office. 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 They therefore recommended clobetasol and tetracycline i.e. 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 03/26/17; apparently the patient just had reflux studies not an appointment with Dr. dew. She arrives in clinic today having applied clobetasol for 2-3 weeks. He notes over the last 2-3 days excessive drainage having to change the dressing 3-4 times a day and also expanding erythema. He  states the expanding erythema seems to come and go and was last this red was earlier in the month.he is on doxycycline 150 mg twice a day as an anti- inflammatory systemic therapy for possible pyoderma gangrenosum along with the topical clobetasol 04/02/17; the patient was seen last week by Dr. Lillia Carmel at [facility] dermatology locally who kindly saw him at my request. 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 My concern was the degree of erythema medially extending from his left leg wound which was either inflammation from pyoderma or cellulitis. I put him on Augmentin however culture of the wound showed Pseudomonas which is quinolone sensitive. I really don't believe he has cellulitis however in view of everything I will continue and give him a course of Cipro. He is also on doxycycline as an immune modulator for the pyoderma. In addition to his original wound on the left lateral leg with surrounding erythema he has a wound on the right posterior calf which was an original biopsy site done by dermatology. 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 Saw Dr. Lillia Carmel yesterday. He has been using topical antibiotics to both wound areas his original wound on the left and the biopsies/pathergy area  on the right. There is definitely some improvement in the inflammation around the wound on the right although the patient states he has increasing sensitivity of the wounds. He is on prednisone 60 and doxycycline 1 as prescribed by Dr. Lillia Carmel. He is covering the topical antibiotic with gauze and putting this in his own compression stocks and changing this daily. He states that Dr. Lottie Rater did a culture of the left leg wound yesterday 05/07/17; pyoderma gangrenosum. The patient saw Dr. Lillia Carmel yesterday and has a follow-up with her in one month. He is still using topical antibiotics to  both wounds although he can't recall exactly what type. He is still on prednisone 60 mg. Dr. Lillia Carmel stated that the doxycycline could stop if we were in agreement. 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 The right medial leg was induced by biopsy/pathergy. The area on the right is essentially healed. 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 The right medial leg has closed and remains closed. He is still on prednisone 60. He tells me he missed his last dermatology appointment with Dr. Lillia Carmel but will make another appointment. He reports that her blood sugar at a recent screen in [place] was high 200's. He was 180 today. 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 I think he is going to require still much longer prednisone perhaps another 3 months before attempting to taper. In the meantime his wound is a lot better. Smaller. He is cleaning this off daily and applying topical antibiotics. When he was last in the clinic I thought about changing to [facility] and actually put in a couple of calls to dermatology although probably not during their business hours. 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 He continues on oral prednisone. He has been using triple antibiotic ointment. There is surface debris and we will transition to [facility] and have him return in 2 weeks. He has lost 30 pounds since his last appointment with lifestyle modification. He may benefit from topical steroid cream for treatment this can be considered at a later date. 08/22/17 on evaluation today patient appears to actually be doing rather well in regard to his left lateral lower extremity ulcer. He  has actually been managed by Dr. Dellia Nims most recently. Patient is currently on oral steroids at this time. This seems to have been of benefit for him. Nonetheless his last visit was actually with Leah on 08/06/17. Currently he is not utilizing any topical steroid creams although this could be of benefit as well. No fevers, chills, nausea, or vomiting noted at this time. 09/05/17 on evaluation today patient appears to be doing better in regard to his left lateral lower extremity ulcer. He has been tolerating the dressing changes without complication. He is using Santyl with good effect. Overall I'm very pleased with how things are standing at this point. Patient likewise is happy that this is doing better. 09/19/17 on evaluation today patient actually appears to be doing rather well in regard to his left lateral lower extremity ulcer. 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 He's not having any  significant pain. 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 He has been tolerating the Santyl without complication and in general I feel like he's making good progress. 10/17/17 on evaluation today patient appears to be doing very well in regard to his left lateral lower surety ulcer. He has been tolerating the dressing changes without complication. There does not appear to be any evidence of infection he's alternating the Santyl and the triple antibiotic ointment every other day this seems to be doing well for him. 11/03/17 on evaluation today patient appears to be doing very well in regard to his left lateral lower extremity ulcer. He is been tolerating the dressing changes without complication which is good news. Fortunately there does not appear to be any evidence of infection which is also great news. Overall is doing  excellent they are starting to taper down on the prednisone is down to 40 mg at this point it also started topical clobetasol for him. 11/17/17 on evaluation today patient appears to be doing well in regard to his left lateral lower surety ulcer. He's been tolerating the dressing changes without complication. He does note that he is having no pain, no excessive drainage or discharge, and overall he feels like things are going about how he would expect and hope they would. Overall he seems to have no evidence of infection at this time in my opinion which is good news. JERMALE, CRASS ([id number]) 12/04/17-He is seen in follow-up evaluation for right lateral lower extremity ulcer. He has been applying topical steroid cream. Today's measurement show slight increase in size. Over the next 2 weeks we will transition to every other day Santyl and steroid cream. He has been encouraged to monitor for changes and notify clinic with any concerns 12/15/17 on evaluation today patient's left lateral motion the ulcer and fortunately is doing worse again at this point. This just since last week to this week has close to doubled in size according to the patient. I did not seeing last week's I do not have a visual to compare this to in our system was also down so we do not have all the charts and at this point. Nonetheless it does have me somewhat concerned in regard to the fact that again he was worried enough about it he has contact the dermatology that placed them back on the full strength, 50 mg a day of the prednisone that he was taken previous. He continues to alternate using clobetasol along with Santyl at this point. He is obviously somewhat frustrated. 12/22/17 on evaluation today patient appears to be doing a little worse compared to last evaluation. Unfortunately the wound is a little deeper and slightly larger than the last week's evaluation. With that being said he has made some progress in regard to the irritation  surrounding at this time unfortunately despite that progress that's been made he still has a significant issue going on here. 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 For that reason I discussed with him today the possibility of perform a wound culture to ensure there's nothing overtly infected. 01/06/18 on evaluation today patient's wound is larger and deeper than previously evaluated. With that being said it did appear that his wound was infected after my last evaluation with him. Subsequently I  did end up prescribing a prescription for Bactrim DS which she has been taking and having no complication with. Fortunately there does not appear to be any evidence of infection at this point in time as far as anything spreading, no want to touch, and overall I feel like things are showing signs of improvement. 01/13/18 on evaluation today patient appears to be even a little larger and deeper than last time. There still muscle exposed in the base of the wound. Nonetheless he does appear to be less erythematous I do believe inflammation is calming down also believe the infection looks like it's probably resolved at this time based on what I'm seeing. No fevers, chills, nausea, or vomiting noted at this time. 01/30/18 on evaluation today patient actually appears to visually look better for the most part. Unfortunately those visually this looks better he does seem to potentially have what may be an abscess in the muscle that has been noted in the central portion of the wound. This is the first time that I have noted what appears to be fluctuance in the central portion of the muscle. With that being said I'm somewhat more concerned about the fact that this might indicate an abscess formation at this location. I do believe that an ultrasound would be appropriate.  This is likely something we need to try to do as soon as possible. He has been switch to mupirocin ointment and he is no longer using the steroid ointment as prescribed by dermatology he sees them again next week he's been decreased from 60 to 40 mg of prednisone. 03/09/18 on evaluation today patient actually appears to be doing a little better compared to last time I saw him. There's not as much erythema surrounding the wound itself. He I did review his most recent infectious disease note which was dated 02/24/18. He saw Dr. Michel Bickers in [place]. 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 Patient is now off of antibiotics and they are continually observing this. There seems to be no change in the past two weeks in my pinion based on what the patient says and what I see today compared to what Dr. Megan Salon likely saw two weeks ago. No fevers, chills, nausea, or vomiting noted at this time. 03/23/18 on evaluation today patient's wound actually appears to be showing signs of improvement which is good news. He is currently still on the Dapsone. He is also working on tapering the prednisone to get off of this and Dr. Lottie Rater is working with him in this regard. Nonetheless overall I feel like the wound is doing well it does appear based on the infectious disease note that I reviewed from Dr. 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 I did also review the results of his ultrasound of left lower extremity which revealed there was a dentist tissue in the base of the wound without an abscess noted. 04/06/18 on evaluation today the patient's left lateral lower extremity ulcer actually appears to be doing fairly well which is excellent news. There does not appear to be any evidence of infection at this time  which is also great news. Overall he still does have a significantly large ulceration although little by little he seems to be making progress. He is down to 10 mg  a day of the prednisone. 04/20/18 on evaluation today patient actually appears to be doing excellent at this time in regard to his left lower extremity ulcer. He's making signs of good progress unfortunately this is taking much longer than we would really like to see but nonetheless he is making progress. Fortunately there does not appear to be any evidence of infection at this time. No fevers, chills, nausea, or vomiting noted at this time. The patient has not been using the Santyl due to the cost he hadn't got in this field yet. He's mainly been using the antibiotic ointment topically. Subsequently he also tells me that he really has not been scrubbing in the shower I think this would be helpful again as I told him it doesn't have to be anything too aggressive to even make it believe just enough to keep it free of some of the loose slough and biofilm on the wound surface. 05/11/18 on evaluation today patient's wound appears to be making slow but sure progress in regard to the left lateral lower extremity ulcer. He is been tolerating the dressing changes without complication. Fortunately there does not appear to be any evidence of infection at this time. He is still just using triple antibiotic ointment along with clobetasol occasionally over the area. He never got the Santyl and really does not seem to intend to in my pinion. 06/01/18 on evaluation today patient appears to be doing a little better in regard to his left lateral lower extremity ulcer. He states that overall he does not feel like he is doing as well with the Dapsone as he did with the prednisone. Nonetheless he sees his dermatologist later today and is gonna talk to them about the possibility of going back on the prednisone. Overall again I believe that the wound would be better  if you would utilize Santyl but he really does not seem to be interested in going back to the [place] at this point. He has been using triple antibiotic ointment. 06/15/18 on evaluation today patient's wound actually appears to be doing about the same at this point. Fortunately there is no signs of infection at this time. He has made slight improvements although he continues to not really want to clean the wound bed at this point. He states that he just doesn't mess with it he doesn't want to cause any problems with everything else he has going on. He has been on medication, antibiotics as prescribed by his dermatologist, for a staff infection of his lower extremities which is really drying out now and looking much better he tells me. Fortunately there is no sign of overall infection. 06/29/18 on evaluation today patient appears to be doing well in regard to his left lateral lower surety ulcer all things considering. Fortunately his staff infection seems to be greatly improved compared to previous. He has no signs of infection and this is drying up quite nicely. He is still the doxycycline Bartok, Jhovanny ([id number]) for this is no longer on cental, Dapsone, or any of the other medications. His dermatologist has recommended possibility of an infusion but right now he does not want to proceed with that. 07/13/18 on evaluation today patient appears to be doing about the same in regard to his left lateral lower surety ulcer. Fortunately there's no signs of infection at this time which is great news. Unfortunately he still builds up a significant amount of Slough/biofilm of the surface of the wound he still is not really cleaning this as  he should be appropriately. Again I'm able to easily with saline and gauze remove the majority of this on the surface which if you would do this at home would likely be a dramatic improvement for him as far as getting the area to improve. Nonetheless overall I still feel like  he is making progress is just very slow. I think Santyl will be of benefit for him as well. Still he has not gotten this as of this point. 07/27/18 on evaluation today patient actually appears to be doing little worse in regards of the erythema around the periwound region of the wound he also tells me that he's been having more drainage currently compared to what he was experiencing last time I saw him. He states not quite as bad as what he had because this was infected previously but nonetheless is still appears to be doing poorly. Fortunately there is no evidence of systemic infection at this point. The patient tells me that he is not going to be able to afford the Santyl. He is still waiting to hear about the infusion therapy with his dermatologist. Apparently she wants an updated colonoscopy first. 08/10/18 on evaluation today patient appears to be doing better in regard to his left lateral lower extremity ulcer. Fortunately he is showing signs of improvement in this regard he's actually been approved for Remicade infusion's as well although this has not been scheduled as of yet. Fortunately there's no signs of active infection at this time in regard to the wound although he is having some issues with infection of the right lower extremity is been seen as dermatologist for this. Fortunately they are definitely still working with him trying to keep things under control. 09/07/18 on evaluation today patient is actually doing rather well in regard to his left lateral lower extremity ulcer. He notes these actually having some hair grow back on his extremity which is something he has not seen in years. He also tells me that the pain is really not giving them any trouble at this time which is also good news overall she is very pleased with the progress he's using a combination of the mupirocin along with the probate is all mixed. 09/21/18 on evaluation today patient actually appears to be doing fairly well  all things considered in regard to his looks from the ulcer. He's been tolerating the dressing changes without complication. Fortunately there's no signs of active infection at this time which is good news he is still on all antibiotics or prevention of the staff infection. He has been on prednisone for time although he states it is gonna contact his dermatologist and see if she put them on a short course due to some irritation that he has going on currently. Fortunately there's no evidence of any overall worsening this is going very slow I think cental would be something that would be helpful for him although he states that $50 for tube is quite expensive. He therefore is not willing to get that at this point. 10/06/18 on evaluation today patient actually appears to be doing decently well in regard to his left lateral leg ulcer. He's been tolerating the dressing changes without complication. Fortunately there's no signs of active infection at this time. Overall I'm actually rather pleased with the progress he's making although it's slow he doesn't show any signs of infection and he does seem to be making some improvement. I do believe that he may need a switch up and dressings to try to  help this to heal more appropriately and quickly. 10/19/18 on evaluation today patient actually appears to be doing better in regard to his left lateral lower extremity ulcer. This is shown signs of having much less Slough buildup at this point due to the fact he has been using the [employer]. Obviously this is very good news. The overall size of the wound is not dramatically smaller but again the appearance is. 11/02/18 on evaluation today patient actually appears to be doing quite well in regard to his lower [place] ulcer. A lot of the skin around the ulcer is actually somewhat irritating at this point this seems to be more due to the dressing causing irritation from the adhesive that anything else. Fortunately there is no  signs of active infection at this time. 11/24/18 on evaluation today patient appears to be doing a little worse in regard to his overall appearance of his lower extremity ulcer. There's more erythema and warmth around the wound unfortunately. He is currently on doxycycline which he has been on for some time. With that being said I'm not sure that seems to be helping with what appears to possibly be an acute cellulitis with regard to his left lower extremity ulcer. No fevers, chills, nausea, or vomiting noted at this time. 12/08/18 on evaluation today patient's wounds actually appears to be doing significantly better compared to his last evaluation. He has been using Santyl along with alternating tripling about appointment as well as the steroid cream seems to be doing quite well and the wound is showing signs of improvement which is excellent news. Fortunately there's no evidence of infection and in fact his culture came back negative with only normal skin flora noted. 12/21/2018 upon evaluation today patient actually appears to be doing excellent with regard to his ulcer. This is actually the best that I have seen it since have been helping to take care of him. It is both smaller as well as less slough noted on the surface of the wound and seems to be showing signs of good improvement with new skin growing from the edges. He has been using just the triamcinolone he does wonder if he can get a refill of that ointment today. 01/04/2019 upon evaluation today patient actually appears to be doing well with regard to his left lateral lower extremity ulcer. With that being said it does not appear to be that he is doing quite as well as last time as far as progression is concerned. There does not appear to be any signs of infection or significant irritation which is good news. With that being said I do believe that he may benefit from switching to a collagen based dressing based on how clean The wound  appears. 01/18/2019 on evaluation today patient actually appears to be doing well with regard to his wound on the left lower extremity. He is not made a lot of progress compared to where we were previous but nonetheless does seem to be doing okay at this time which is good news. There is no signs of active infection which is also good news. My only concern currently is I do wish we can get him into utilizing the collagen dressing his insurance would not pay for the supplies that we ordered although it appears that he may be able to order this through his supply company that he typically utilizes. This is [employer]. Nonetheless he did try to order it during the office visit today and it appears this did go through. We  will see if he can get that it is a different brand but nonetheless he has collagen and I do think will be beneficial. MARQUES, ERICSON ([id number]) 02/01/2019 on evaluation today patient actually appears to be doing a little worse today in regard to the overall size of his wounds. Fortunately there is no signs of active infection at this time. That is visually. Nonetheless when this is happened before it was due to infection. For that reason were somewhat concerned about that this time as well. 02/08/2019 on evaluation today patient unfortunately appears to be doing slightly worse with regard to his wound upon evaluation today. Is measuring a little deeper and a little larger unfortunately. I am not really sure exactly what is causing this to enlarge he actually did see his dermatologist she is going to see about initiating Humira for him. Subsequently she also did do steroid injections into the wound itself in the periphery. Nonetheless still nonetheless he seems to be getting a little bit larger he is gone back to just using the steroid cream topically which I think is appropriate. I would say hold off on the collagen for the time being is definitely a good thing to do. Based on the culture  results which we finally did get the final result back regarding it shows staph as the bacteria noted again that can be a normal skin bacteria based on the fact however he is having increased drainage and worsening of the wound measurement wise I would go ahead and place him on an antibiotic today I do believe for this. 02/15/2019 on evaluation today patient actually appears to be doing somewhat better in regard to his ulcer. There is no signs of worsening at this time I did review his culture results which showed evidence of Staphylococcus aureus but not MRSA. Again this could just be more related to the normal skin bacteria although he states the drainage has slowed down quite a bit he may have had a mild infection not just colonization. And was much smaller and then since around10/04/2019 on evaluation today patient appears to be doing unfortunately worse as far as the size of the wound. I really feel like that this is steadily getting larger again it had been doing excellent right at the beginning of September we have seen a steady increase in the area of the wound it is almost 2-1/2 times the size it was on September 1. Obviously this is a bad trend this is not wanting to see. For that reason we went back to using just the topical triamcinolone cream which does seem to help with inflammation. I checked him for bacteria by way of culture and nothing showed positive there. I am considering giving him a short course of a tapering steroid Dosepak today to see if that is can be beneficial for him. The patient is in agreement with giving that a try. 03/08/2019 on evaluation today patient appears to be doing very well in comparison to last evaluation with regard to his lower extremity ulcer. This is showing signs of less inflammation and actually measuring slightly smaller compared to last time every other week over the past month and a half he has been measuring larger larger larger. Nonetheless I do  believe that the issue has been inflammation the prednisone does seem to have been beneficial for him which is good news. No fevers, chills, nausea, vomiting, or diarrhea. 03/22/2019 on evaluation today patient appears to be doing about the same with regard to his  leg ulcer. He has been tolerating the dressing changes without complication. With that being said the wound seems to be mostly arrested at its current size but really is not making any progress except for when we prescribed the prednisone. He did show some signs of dropping as far as the overall size of the wound during that interval week. Nonetheless this is something he is not on long-term at this point and unfortunately I think he is getting need either this or else the Humira which his dermatologist has discussed try to get approval for. With that being said he will be seeing his dermatologist on the 11th of this month that is November. 04/19/2019 on evaluation today patient appears to be doing really about the same the wound is measuring slightly larger compared to last time I saw him. He has not been into the office since November 2 due to the fact that he unfortunately had Covid as that his entire family. He tells me that it was rough but they did pull-through and he seems to be doing much better. Fortunately there is no signs of active infection at this time. No fevers, chills, nausea, vomiting, or diarrhea. 05/10/2019 on evaluation today patient unfortunately appears to be doing significantly worse as compared to last time I saw him. He does tell me that he has had his first dose of Humira and actually is scheduled to get the next one in the upcoming week. With that being said he tells me also that in the past several days he has been having a lot of issues with green drainage she showed me a picture this is more blue-green in color. He is also been having issues with increased sloughy buildup and the wound does appear to be larger  today. Obviously this is not the direction that we want everything to take based on the starting of his Humira. Nonetheless I think this is definitely a result of likely infection and to be honest I think this is probably Pseudomonas causing the infection based on what I am seeing. 05/24/2019 on evaluation today patient unfortunately appears to be doing significantly worse compared to his prior evaluation with me 2 weeks ago. I did review his culture results which showed that he does have Staph aureus as well as Pseudomonas noted on the culture. Nonetheless the Levaquin that I prescribed for him does not appear to have been appropriate and in fact he tells me he is no longer experiencing the green drainage and discharge that he had at the last visit. Fortunately there is no signs of active infection at this time which is good news although the wound has significantly worsened it in fact is much deeper than it was previous. We have been utilizing up to this point triamcinolone ointment as the prescription topical of choice but at this time I really feel like that the wound is getting need to be packed in order to appropriately manage this due to the deeper nature of the wound. Therefore something along the lines of an alginate dressing may be more appropriate. 05/31/2019 upon inspection today patient's wound actually showed signs of doing poorly at this point. Unfortunately he just does not seem to be making any good progress despite what we have tried. He actually did go ahead and pick up the Cipro and start taking that as he was noticing more green drainage he had previously completed the Levaquin that I prescribed for him as well. Nonetheless he missed his appointment for the seventh  last week on Wednesday with the wound care center and [facility] where his dermatologist referred him. Obviously I do think a second opinion would be helpful at this point especially in light of the fact that the  patient seems to be doing so poorly despite the fact that we have tried everything that I really know how at this point. The only thing that ever seems to have helped him in the past is when he was on high doses of continual steroids that did seem to make a difference for him. 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 06/07/2019 upon evaluation today patient unfortunately appears to be doing worse yet again with regard to his wound. In fact I am starting to question whether or not he may have a fluid pocket in the muscle at this point based on the bulging and the soft appearance to the central portion of the muscle area. There is not anything draining from the muscle itself at this time which is good news but nonetheless the wound is expanding. I am not really seeing any results of the Humira as far as overall wound progression based on what I am seeing at this point. The patient has been referred for second opinion with regard to his wound to the [facility] wound care center by his dermatologist which I definitely am not in opposition to. Unfortunately we tried multiple dressings in the past including collagen, alginate, and at one point even Hydrofera Blue. With that being said he is never really used it for any significant amount of time due to the fact that he often complains of pain associated with these dressings and then will go back to either using the Santyl which she has done intermittently or more frequently the triamcinolone. He is also using his own compression stockings. We have wrapped him in the past but again that was something else that he really was not a big fan of. 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 He seemed to do much better when he was on the steroids although honestly I understand that the steroids are not necessarily the best medication to be on long-term obviously 06/14/2019 on evaluation today patient appears to be doing actually a little bit better with regard to the overall appearance with his leg. Unfortunately he does continue to have issues with what appears to be some fluid underneath the muscle although he did see the wound specialty center at [facility] last week their main goals were to see about infusion therapy in place of the Humira as they feel like that is not quite strong enough. They also recommended that we continue with the treatment otherwise as we are they felt like that was appropriate and they are okay with him continuing to follow-up here with [place] in that regard. With that being said they are also sending him to the vein specialist there to see about vein stripping and if that  would be of benefit for him. Subsequently they also did not really address whether or not an ultrasound of the muscle area to see if there is anything that needs to be addressed here would be appropriate or not. For that reason I discussed this with him last week I think we may proceed down that road at this point. 06/21/2019 upon evaluation today patient's wound actually appears to be doing slightly better compared to previous evaluations. I do believe that he has made a difference with regard to the progression here with the use of oral steroids. Again in the past has been the only thing that is really calm things down. He does tell me that from [facility] is gotten a good news from there that there are no further vein stripping that is necessary at this point. I do not have that  available for review today although the patient did relay this to me. He also did obtain and have the ultrasound of the wound completed which I did sign off on today. It does appear that there is no fluid collection under the muscle this is likely then just edematous tissue in general. That is also good news. Overall I still believe the inflammation is the main issue here. He did inquire about the possibility of a wound VAC again with the muscle protruding like it is I am not really sure whether the wound VAC is necessarily ideal or not. That is something we will have to consider although I do believe he may need compression wrapping to try to help with edema control which could potentially be of benefit. 06/28/2019 on evaluation today patient appears to be doing slightly better measurement wise although this is not terribly smaller he least seems to be trending towards that direction. With that being said he still seems to have purulent drainage noted in the wound bed at this time. He has been on Levaquin followed by Cipro over the past month. Unfortunately he still seems to have some issues with active infection at this time. I did perform a culture last week in order to evaluate and see if indeed there was still anything going on. Subsequently the culture did come back showing Pseudomonas which is consistent with the drainage has been having which is blue-green in color. He also has had an odor that again was somewhat consistent with Pseudomonas as well. Long story short it appears that the culture showed an intermediate finding with regard to how well the Cipro will work for the Pseudomonas infection. Subsequently being that he does not seem to be clearing up and at best what we are doing is just keeping this at [place] I think he may need to see infectious disease to discuss IV antibiotic options. 07/05/2019 upon evaluation today patient appears to be doing okay in regard to his leg ulcer. He has been  tolerating the dressing changes at this point without complication. Fortunately there is no signs of active infection at this time which is good news. No fevers, chills, nausea, vomiting, or diarrhea. With that being said he does have an appointment with infectious disease tomorrow and his primary care on Wednesday. Again the reason for the infectious disease referral was due to the fact that he did not seem to be fully resolving with the use of oral antibiotics and therefore we were thinking that IV antibiotic therapy may be necessary secondary to the fact that there was an intermediate finding for how effective the Cipro may be. Nonetheless again he  has been having a lot of purulent and even green drainage. Fortunately right now that seems to have calmed down over the past week with the reinitiation of the oral antibiotic. Nonetheless we will see what Dr. Megan Salon has to say. 07/12/2019 upon evaluation today patient appears to be doing about the same at this point in regard to his left lower extremity ulcer. Fortunately there is no signs of active infection at this time which is good news I do believe the Levaquin has been beneficial I did review Dr. Hale Bogus note and to be honest I agree that the patient's leg does appear to be doing better currently. What we found in the past as he does not seem to really completely resolve he will stop the antibiotic and then subsequently things will revert back to having issues with blue-green drainage, increased pain, and overall worsening in general. Obviously that is the reason I sent him back to infectious disease. 07/19/2019 upon evaluation today patient appears to be doing roughly the same in size there is really no dramatic improvement. He has started back on the Levaquin at this point and though he seems to be doing okay he did still have a lot of blue/green drainage noted on evaluation today unfortunately. I think that this is still indicative more likely  of a Pseudomonas infection as previously noted and again he does see Dr. Megan Salon in just a couple of days. I do not know that were really able to effectively clear this with just oral antibiotics alone based on what I am seeing currently. Nonetheless we are still continue to try to manage as best we can with regard to the patient and his wound. I do think the wrap was helpful in decreasing the edema which is excellent news. No fevers, chills, nausea, vomiting, or diarrhea. 07/26/2019 upon evaluation today patient appears to be doing slightly better with regard to the overall appearance of the muscle there is no dark discoloration centrally. Fortunately there is no signs of active infection at this time. No fevers, chills, nausea, vomiting, or diarrhea. Patient's wound bed currently the patient did have an appointment with Dr. Megan Salon at infectious disease last week. With that being said Dr. Megan Salon the patient states was still somewhat hesitant about put him on any IV antibiotics he wanted [place] to repeat cultures today and then see where things go going forward. He does look like Dr. Megan Salon because of some improvement the patient did have with the Levaquin wanted [place] to see about repeating cultures. If it indeed grows the Pseudomonas again then he recommended a possibility of considering a PICC line placement and IV antibiotic therapy. He plans to see the patient back in 1 to 2 weeks. 08/02/2019 upon evaluation today patient appears to be doing poorly with regard to his left lower extremity. We did get the results of his culture back it shows that he is still showing evidence of Pseudomonas which is consistent with the purulent/blue-green drainage that he has currently. Subsequently the culture also shows that he now is showing resistance to the oral fluoroquinolones which is unfortunate as that was really the only thing to treat the infection prior. I do believe that he is looking like this is going  require IV antibiotic therapy to get this under control. Fortunately there is no signs of systemic infection at this time which is good news. The patient does see Dr. Megan Salon tomorrow. 08/09/2019 upon evaluation today patient appears to be doing better with regard to his  left lower extremity ulcer in regard to the overall appearance. He is currently on IV antibiotic therapy. As ordered by Dr. Megan Salon. Currently the patient is on ceftazidime which she is going to take for the next 2 weeks and then follow-up for 4 to 5-week appointment with Dr. Megan Salon. The patient started this this past Friday symptoms have not for a total of 3 days currently in full. QUENCY, TOBER ([id number]) 08/16/2019 upon evaluation today patient's wound actually does show muscle in the base of the wound but in general does appear to be much better as far as the overall evidence of infection is concerned. In fact I feel like this is for the most part cleared up he still on the IV antibiotics he has not completed the full course yet but I think he is doing much better which is excellent news. 08/23/2019 upon evaluation today patient appears to be doing about the same with regard to his wound at this point. He tells me that he still has pain unfortunately. Fortunately there is no evidence of systemic infection at this time which is great news. There is significant muscle protrusion. Objective Constitutional Well-nourished and well-hydrated in no acute distress. Vitals Time Taken: 8:20 AM, Height: 71 in, Weight: 338 lbs, BMI: 47.1, Temperature: 98.5 F, Pulse: 88 bpm, Respiratory Rate: 18 breaths/min, Blood Pressure: 141/68 mmHg. Respiratory normal breathing without difficulty. Psychiatric this patient is able to make decisions and demonstrates good insight into disease process. Alert and Oriented x 3. pleasant and cooperative. General Notes: Patient's wound bed currently showed signs of good granulation at this time.  Fortunately there is no evidence of active infection which is great news. No fevers, chills, nausea, vomiting, or diarrhea. Integumentary (Hair, Skin) Wound #1 status is Open. Original cause of wound was Gradually Appeared. The wound is located on the Left,Lateral Lower Leg. The wound measures 6.5cm length x 7cm width x 1cm depth; 35.736cm^2 area and 35.736cm^3 volume. There is muscle and Fat Layer (Subcutaneous Tissue) Exposed exposed. There is no tunneling or undermining noted. There is a medium amount of serous drainage noted. The wound margin is epibole. There is medium (34-66%) pink granulation within the wound bed. There is a medium (34-66%) amount of necrotic tissue within the wound bed including Adherent Slough. 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 Anesthetic (add to Medication List): Wound #1 Left,Lateral Lower Leg: Topical Lidocaine 4% cream applied to wound bed prior to debridement (In Clinic Only). Primary Wound Dressing: Collagen - moisten with normal saline Zarzycki, Herberto ([id number]) Secondary Dressing: Wound #1 Left,Lateral Lower Leg: XtraSorb - secure with tape Dressing Change Frequency: Wound #1 Left,Lateral Lower Leg: Change dressing every day. Follow-up Appointments: Wound #1 Left,Lateral Lower Leg: Return Appointment in 1 week. Edema Control: Wound #1 Left,Lateral Lower Leg: Elevate legs to the level of the heart and pump ankles as often as possible - Tubi grip F Laboratory ordered were: Wound culture routine - Left leg 1. My suggestion at this time is going to be that we go ahead and continue with the current treatment utilizing the collagen he is using triamcinolone on top of this in order to help with inflammatory  issues that he has. Obviously that is something he can continue at this time as far as I am concerned. 2. He is done with the antibiotics  by IV at this point. I did obtain a wound culture due to the fact that he is concerned about some drainage which has started occurring just in the past couple of days. We will see what this shows this was taken of the deep wound area. 3. I am also can recommend that he continue with his compression stocking. He is also using a border gauze dressing to cover this. We will see patient back for reevaluation in 1 week here in the clinic. If anything worsens or changes patient will contact our office for additional recommendations. Electronic Signature(s) Signed: 08/23/2019 1:23:19 PM By: Worthy Keeler PA-C Entered By: Worthy Keeler on 08/23/2019 13:23:18 FOUNTAIN, DERUSHA ([id number]) -------------------------------------------------------------------------------- SuperBill Details Patient Name: [name] Date of Service: 08/23/2019 Medical Record Number: [id number] Patient Account Number: [id number] Date of Birth/Sex: 16-Oct-1980 (39 y.o. M) Treating RN: Army Melia Primary Care Provider: [name] Other Clinician: Referring Provider: [name] Treating Provider/Extender: Melburn Hake, Newell Wafer Weeks in Treatment: 141 Diagnosis Coding ICD-10 Codes Code Description [phone] Non-pressure chronic ulcer of left calf with fat layer exposed L88 Pyoderma gangrenosum I87.2 Venous insufficiency (chronic) (peripheral) L03.116 Cellulitis of left lower limb Facility Procedures CPT4 Code: [id number] Description: 25750 - WOUND CARE VISIT-LEV 3 EST PT Modifier: Quantity: 1 Physician Procedures CPT4 Code: [id number] Description: 25189 - WC PHYS LEVEL 3 - EST PT Modifier: Quantity: 1 CPT4 Code: Description: ICD-10 Diagnosis Description L97.222 Non-pressure chronic ulcer of left calf with fat layer exposed L88 Pyoderma gangrenosum I87.2 Venous insufficiency  (chronic) (peripheral) L03.116 Cellulitis of left lower limb Modifier: Quantity: Electronic Signature(s) Signed: 08/23/2019 1:23:47 PM By: Worthy Keeler PA-C Entered By: Worthy Keeler on 08/23/2019 13:23:46

## 2019-08-23 NOTE — Progress Notes (Signed)
[name], [name] ([id number]) Visit Report for 08/23/2019 Arrival Information Details Patient Name: [name] Date of Service: 08/23/2019 8:15 AM Medical Record Number: [id number] Patient Account Number: [id number] Date of Birth/Sex: 04/13/1981 (39 y.o. M) Treating RN: Montey Hora Primary Care Lovinia Snare: [name] Other Clinician: Referring Lyndy Russman: [name] Treating Mylinh Cragg/Extender: Melburn Hake, [name]: 49 Visit Information History Since Last Visit Added or deleted any medications: No Patient Arrived: Ambulatory Any new allergies or adverse reactions: No Arrival Time: 08:19 Had a fall or experienced change in No Accompanied By: self activities of daily living that may affect Transfer Assistance: None risk of falls: Patient Identification Verified: Yes Signs or symptoms of abuse/neglect since last visito No Secondary Verification Process Completed: Yes Hospitalized since last visit: No Patient Requires Transmission-Based No Implantable device outside of the clinic excluding No Precautions: cellular tissue based products placed in the center Patient Has Alerts: Yes since last visit: Patient Alerts: 08/09/19 Picc Line Has Dressing in Place as Prescribed: Yes Right Has Compression in Place as Prescribed: Yes Pain Present Now: No Electronic Signature(s) Signed: 08/23/2019 4:25:15 PM By: Montey Hora Entered By: Montey Hora on 08/23/2019 08:20:07 [name] ([id number]) -------------------------------------------------------------------------------- Clinic Level of Care Assessment Details Patient Name: [name] Date of Service: 08/23/2019 8:15 AM Medical Record Number: [id number] Patient Account Number: [id number] Date of Birth/Sex: 1981/03/26 (39 y.o. M) Treating RN: Army Melia Primary Care Daziya Redmond: [name] Other Clinician: Referring Tristan Bramble: [name] Treating Glendell Schlottman/Extender: Melburn Hake, [name]:  141 Clinic Level of Care Assessment Items TOOL 4 Quantity Score []  - Use when only an EandM is performed on FOLLOW-UP visit 0 ASSESSMENTS - Nursing Assessment / Reassessment X - Reassessment of Co-morbidities (includes updates in patient status) 1 10 X- 1 5 Reassessment of Adherence to Treatment Plan ASSESSMENTS - Wound and Skin Assessment / Reassessment X - Simple Wound Assessment / Reassessment - one wound 1 5 []  - 0 Complex Wound Assessment / Reassessment - multiple wounds []  - 0 Dermatologic / Skin Assessment (not related to wound area) ASSESSMENTS - Focused Assessment []  - Circumferential Edema Measurements - multi extremities 0 []  - 0 Nutritional Assessment / Counseling / Intervention []  - 0 Lower Extremity Assessment (monofilament, tuning fork, pulses) []  - 0 Peripheral Arterial Disease Assessment (using hand held doppler) ASSESSMENTS - Ostomy and/or Continence Assessment and Care []  - Incontinence Assessment and Management 0 []  - 0 Ostomy Care Assessment and Management (repouching, etc.) PROCESS - Coordination of Care X - Simple Patient / Family Education for ongoing care 1 15 []  - 0 Complex (extensive) Patient / Family Education for ongoing care []  - 0 Staff obtains Programmer, systems, Records, Test Results / Process Orders []  - 0 Staff telephones HHA, Nursing Homes / Clarify orders / etc []  - 0 Routine Transfer to another Facility (non-emergent condition) []  - 0 Routine Hospital Admission (non-emergent condition) []  - 0 New Admissions / Biomedical engineer / Ordering NPWT, Apligraf, etc. []  - 0 Emergency Hospital Admission (emergent condition) X- 1 10 Simple Discharge Coordination []  - 0 Complex (extensive) Discharge Coordination PROCESS - Special Needs []  - Pediatric / Minor Patient Management 0 []  - 0 Isolation Patient Management []  - 0 Hearing / Language / Visual special needs []  - 0 Assessment of Community assistance (transportation, D/C planning,  etc.) [name], [name] ([id number]) []  - 0 Additional assistance / Altered mentation []  - 0 Support Surface(s) Assessment (bed, cushion, seat, etc.) INTERVENTIONS - Wound Cleansing / Measurement X - Simple Wound Cleansing - one  wound 1 5 []  - 0 Complex Wound Cleansing - multiple wounds X- 1 5 Wound Imaging (photographs - any number of wounds) []  - 0 Wound Tracing (instead of photographs) X- 1 5 Simple Wound Measurement - one wound []  - 0 Complex Wound Measurement - multiple wounds INTERVENTIONS - Wound Dressings []  - Small Wound Dressing one or multiple wounds 0 X- 1 15 Medium Wound Dressing one or multiple wounds []  - 0 Large Wound Dressing one or multiple wounds []  - 0 Application of Medications - topical []  - 0 Application of Medications - injection INTERVENTIONS - Miscellaneous []  - External ear exam 0 []  - 0 Specimen Collection (cultures, biopsies, blood, body fluids, etc.) []  - 0 Specimen(s) / Culture(s) sent or taken to Lab for analysis []  - 0 Patient Transfer (multiple staff / Civil Service fast streamer / Similar devices) []  - 0 Simple Staple / Suture removal (25 or less) []  - 0 Complex Staple / Suture removal (26 or more) []  - 0 Hypo / Hyperglycemic Management (close monitor of Blood Glucose) []  - 0 Ankle / Brachial Index (ABI) - do not check if billed separately X- 1 5 Vital Signs Has the patient been seen at the hospital within the last three years: Yes Total Score: 80 Level Of Care: New/Established - Level 3 Electronic Signature(s) Signed: 08/23/2019 3:08:20 PM By: Army Melia Entered By: Army Melia on 08/23/2019 08:51:28 [name] ([id number]) -------------------------------------------------------------------------------- Encounter Discharge Information Details Patient Name: [name] Date of Service: 08/23/2019 8:15 AM Medical Record Number: [id number] Patient Account Number: [id number] Date of Birth/Sex: 27-Oct-1980 (39 y.o. M) Treating RN: Army Melia Primary Care Bevelyn Arriola: [name] Other Clinician: Referring Shamere [name]: [name] Treating Arnav Cregg/Extender: Melburn Hake, [name]: 141 Encounter Discharge Information Items Discharge Condition: Stable Ambulatory Status: Ambulatory Discharge Destination: Home Transportation: Private Auto Accompanied By: self Schedule Follow-up Appointment: Yes Clinical Summary of Care: Electronic Signature(s) Signed: 08/23/2019 3:08:20 PM By: Army Melia Entered By: Army Melia on 08/23/2019 08:52:19 [name] ([id number]) -------------------------------------------------------------------------------- Lower Extremity Assessment Details Patient Name: [name] Date of Service: 08/23/2019 8:15 AM Medical Record Number: [id number] Patient Account Number: [id number] Date of Birth/Sex: 13-Nov-1980 (39 y.o. M) Treating RN: Montey Hora Primary Care Rasheed Welty: [name] Other Clinician: Referring Floyd Wade: [name] Treating Karma Hiney/Extender: Melburn Hake, [name]: 141 Edema Assessment Assessed: [Left: No] [Right: No] Edema: [Left: Ye] [Right: s] Vascular Assessment Pulses: Dorsalis Pedis Palpable: [Left:Yes] Electronic Signature(s) Signed: 08/23/2019 4:25:15 PM By: Montey Hora Entered By: Montey Hora on 08/23/2019 08:28:34 [name] ([id number]) -------------------------------------------------------------------------------- Multi Wound Chart Details Patient Name: [name] Date of Service: 08/23/2019 8:15 AM Medical Record Number: [id number] Patient Account Number: [id number] Date of Birth/Sex: Nov 23, 1980 (39 y.o. M) Treating RN: Army Melia Primary Care Cordale Manera: [name] Other Clinician: Referring Alilah Mcmeans: [name] Treating Ermie Glendenning/Extender: Melburn Hake, [name]: 141 Vital Signs Height(in): 71 Pulse(bpm): 88 Weight(lbs): 338 Blood Pressure(mmHg): 141/68 Body Mass Index(BMI):  47 Temperature(F): 98.5 Respiratory Rate(breaths/min): 18 Photos: [N/A:N/A] Wound Location: Left, Lateral Lower Leg N/A N/A Wounding Event: Gradually Appeared N/A N/A Primary Etiology: Pyoderma N/A N/A Comorbid History: Sleep Apnea, Hypertension, Colitis N/A N/A Date Acquired: 11/18/2015 N/A N/A Weeks of Treatment: 141 N/A N/A Wound Status: Open N/A N/A Measurements L x W x D (cm) 6.5x7x1 N/A N/A Area (cm) : 35.736 N/A N/A Volume (cm) : 35.736 N/A N/A % Reduction in Area: -628.00% N/A N/A % Reduction in Volume: -810.00% N/A N/A Classification: Full Thickness With Exposed N/A N/A Support Structures  Exudate Amount: Medium N/A N/A Exudate Type: Serous N/A N/A Exudate Color: amber N/A N/A Wound Margin: Epibole N/A N/A Granulation Amount: Medium (34-66%) N/A N/A Granulation Quality: Pink N/A N/A Necrotic Amount: Medium (34-66%) N/A N/A Exposed Structures: Fat Layer (Subcutaneous Tissue) N/A N/A Exposed: Yes Muscle: Yes Fascia: No Tendon: No Joint: No Bone: No Epithelialization: None N/A N/A Treatment Notes Electronic Signature(s) Signed: 08/23/2019 3:08:20 PM By: Army Melia Entered By: Army Melia on 08/23/2019 08:45:25 [name], [name] ([id number]) [name], [name] ([id number]) -------------------------------------------------------------------------------- Multi-Disciplinary Care Plan Details Patient Name: [name] Date of Service: 08/23/2019 8:15 AM Medical Record Number: [id number] Patient Account Number: [id number] Date of Birth/Sex: 05-06-1981 (39 y.o. M) Treating RN: Army Melia Primary Care Ahana Najera: [name] Other Clinician: Referring Evangela Heffler: [name] Treating Geovanie Winnett/Extender: Melburn Hake, [name]: 141 Active Inactive Venous Leg Ulcer Nursing Diagnoses: Knowledge deficit related to disease process and management Potential for venous Insuffiency (use before diagnosis confirmed) Goals: Non-invasive venous studies are completed as  ordered Date Initiated: 12/11/2016 Target Resolution Date: 03/13/2017 Goal Status: Active Patient will maintain optimal edema control Date Initiated: 12/11/2016 Target Resolution Date: 03/13/2017 Goal Status: Active Patient/caregiver will verbalize understanding of disease process and disease management Date Initiated: 12/11/2016 Target Resolution Date: 03/13/2017 Goal Status: Active Verify adequate tissue perfusion prior to therapeutic compression application Date Initiated: 12/11/2016 Target Resolution Date: 03/13/2017 Goal Status: Active Interventions: Assess peripheral edema status every visit. Compression as ordered Provide education on venous insufficiency Treatment Activities: Therapeutic compression applied : 12/11/2016 Notes: Wound/Skin Impairment Nursing Diagnoses: Impaired tissue integrity Knowledge deficit related to ulceration/compromised skin integrity Goals: Patient/caregiver will verbalize understanding of skin care regimen Date Initiated: 12/11/2016 Target Resolution Date: 03/13/2017 Goal Status: Active Ulcer/skin breakdown will have a volume reduction of 30% by week 4 Date Initiated: 12/11/2016 Target Resolution Date: 03/13/2017 Goal Status: Active Ulcer/skin breakdown will have a volume reduction of 50% by week 8 Date Initiated: 12/11/2016 Target Resolution Date: 03/13/2017 Goal Status: Active Ulcer/skin breakdown will have a volume reduction of 80% by week 12 Date Initiated: 12/11/2016 Target Resolution Date: 03/13/2017 [name], [name] ([id number]) Goal Status: Active Ulcer/skin breakdown will heal within 14 weeks Date Initiated: 12/11/2016 Target Resolution Date: 03/13/2017 Goal Status: Active Interventions: Assess patient/caregiver ability to obtain necessary supplies Assess patient/caregiver ability to perform ulcer/skin care regimen upon admission and as needed Assess ulceration(s) every visit Provide education on ulcer and skin care Treatment  Activities: Skin care regimen initiated : 12/11/2016 Topical wound management initiated : 12/11/2016 Notes: Electronic Signature(s) Signed: 08/23/2019 3:08:20 PM By: Army Melia Entered By: Army Melia on 08/23/2019 08:45:14 [name] ([id number]) -------------------------------------------------------------------------------- Pain Assessment Details Patient Name: [name] Date of Service: 08/23/2019 8:15 AM Medical Record Number: [id number] Patient Account Number: [id number] Date of Birth/Sex: 12/07/1980 (39 y.o. M) Treating RN: Montey Hora Primary Care Ronnika Collett: [name] Other Clinician: Referring Brett Soza: [name] Treating Pasco Marchitto/Extender: Melburn Hake, [name]: 141 Active Problems Location of Pain Severity and Description of Pain Patient Has Paino Yes Site Locations Pain Location: Pain in Ulcers With Dressing Change: Yes Duration of the Pain. Constant / Intermittento Intermittent Pain Management and Medication Current Pain Management: Electronic Signature(s) Signed: 08/23/2019 4:25:15 PM By: Montey Hora Entered By: Montey Hora on 08/23/2019 08:20:37 [name] ([id number]) -------------------------------------------------------------------------------- Patient/Caregiver Education Details Patient Name: [name] Date of Service: 08/23/2019 8:15 AM Medical Record Number: [id number] Patient Account Number: [id number] Date of Birth/Gender: May 19, 1981 (39 y.o. M) Treating RN: Army Melia Primary Care Physician: [name] Other Clinician: Referring Physician: Carlis Abbott,  KATHERINE Treating Physician/Extender: Worthy Keeler Weeks in Treatment: 141 Education Assessment Education Provided To: Patient Education Topics Provided Wound/Skin Impairment: Handouts: Caring for Your Ulcer Methods: Demonstration, Explain/Verbal Responses: State content correctly Electronic Signature(s) Signed: 08/23/2019 3:08:20 PM By: Army Melia Entered By: Army Melia on 08/23/2019 08:51:44 [name], [name] ([id number]) -------------------------------------------------------------------------------- Wound Assessment Details Patient Name: [name] Date of Service: 08/23/2019 8:15 AM Medical Record Number: [id number] Patient Account Number: [id number] Date of Birth/Sex: Jun 21, 1980 (39 y.o. M) Treating RN: Montey Hora Primary Care Ernisha Sorn: [name] Other Clinician: Referring Evelyne Makepeace: [name] Treating Peytin Dechert/Extender: Melburn Hake, [name]: 141 Wound Status Wound Number: 1 Primary Etiology: Pyoderma Wound Location: Left, Lateral Lower Leg Wound Status: Open Wounding Event: Gradually Appeared Comorbid History: Sleep Apnea, Hypertension, Colitis Date Acquired: 11/18/2015 Weeks Of Treatment: 141 Clustered Wound: No Photos Wound Measurements Length: (cm) 6.5 Width: (cm) 7 Depth: (cm) 1 Area: (cm) 35.736 Volume: (cm) 35.736 % Reduction in Area: -628% % Reduction in Volume: -810% Epithelialization: None Tunneling: No Undermining: No Wound Description Classification: Full Thickness With Exposed Support Structur Wound Margin: Epibole Exudate Amount: Medium Exudate Type: Serous Exudate Color: amber es Foul Odor After Cleansing: No Slough/Fibrino Yes Wound Bed Granulation Amount: Medium (34-66%) Exposed Structure Granulation Quality: Pink Fascia Exposed: No Necrotic Amount: Medium (34-66%) Fat Layer (Subcutaneous Tissue) Exposed: Yes Necrotic Quality: Adherent Slough Tendon Exposed: No Muscle Exposed: Yes Necrosis of Muscle: No Joint Exposed: No Bone Exposed: No Treatment Notes Wound #1 (Left, Lateral Lower Leg) Notes plain collagen, xsorb, tape Electronic Signature(s) [name], [name] ([id number]) Signed: 08/23/2019 4:25:15 PM By: Montey Hora Entered By: Montey Hora on 08/23/2019 08:28:17 [name]  ([id number]) -------------------------------------------------------------------------------- Vitals Details Patient Name: [name] Date of Service: 08/23/2019 8:15 AM Medical Record Number: [id number] Patient Account Number: [id number] Date of Birth/Sex: 08-18-1980 (39 y.o. M) Treating RN: Montey Hora Primary Care Claron Rosencrans: [name] Other Clinician: Referring Tequisha Maahs: [name] Treating Tinsleigh Slovacek/Extender: Melburn Hake, [name]: 141 Vital Signs Time Taken: 08:20 Temperature (F): 98.5 Height (in): 71 Pulse (bpm): 88 Weight (lbs): 338 Respiratory Rate (breaths/min): 18 Body Mass Index (BMI): 47.1 Blood Pressure (mmHg): 141/68 Reference Range: 80 - 120 mg / dl Electronic Signature(s) Signed: 08/23/2019 4:25:15 PM By: Montey Hora Entered By: Montey Hora on 08/23/2019 08:21:59

## 2019-08-26 LAB — AEROBIC CULTURE W GRAM STAIN (SUPERFICIAL SPECIMEN)

## 2019-08-28 ENCOUNTER — Other Ambulatory Visit: Payer: Self-pay | Admitting: Primary Care

## 2019-08-28 DIAGNOSIS — F330 Major depressive disorder, recurrent, mild: Secondary | ICD-10-CM

## 2019-08-30 ENCOUNTER — Encounter: Payer: 59 | Admitting: Physician Assistant

## 2019-09-06 ENCOUNTER — Ambulatory Visit: Payer: 59 | Admitting: Primary Care

## 2019-09-06 ENCOUNTER — Encounter: Payer: 59 | Admitting: Physician Assistant

## 2019-09-08 ENCOUNTER — Encounter: Payer: Self-pay | Admitting: Primary Care

## 2019-09-08 ENCOUNTER — Other Ambulatory Visit: Payer: Self-pay

## 2019-09-08 ENCOUNTER — Ambulatory Visit (INDEPENDENT_AMBULATORY_CARE_PROVIDER_SITE_OTHER): Payer: 59 | Admitting: Primary Care

## 2019-09-08 VITALS — BP 122/82 | HR 79 | Temp 96.5°F | Ht 71.0 in | Wt 346.2 lb

## 2019-09-08 DIAGNOSIS — E119 Type 2 diabetes mellitus without complications: Secondary | ICD-10-CM

## 2019-09-08 LAB — POCT GLYCOSYLATED HEMOGLOBIN (HGB A1C): Hemoglobin A1C: 8.1 % — AB (ref 4.0–5.6)

## 2019-09-08 NOTE — Assessment & Plan Note (Signed)
Improvement in A1C with level of 8.1 today. He is off of prednisone which is great.   Poor diet and is not exercising, discussed the absolute need to work on both.  Managed on statin. Urine microalbumin negative. Foot exam today.  Discussed to switch Lantus 25 units to AM for better afternoon coverage. Discussed to update via My Chart if glucose readings remain at or above 150 consistently.  Continue Metformin.  Follow up in 3-4 months.

## 2019-09-08 NOTE — Progress Notes (Signed)
Subjective:    Patient ID: [name], male    DOB: September 08, 1980, 39 y.o.   MRN: [id number]  HPI  This visit occurred during the SARS-CoV-2 public health emergency.  Safety protocols were in place, including screening questions prior to the visit, additional usage of staff PPE, and extensive cleaning of exam room while observing appropriate contact time as indicated for disinfecting solutions.   Mr. Melucci is a 39 year old male with a history of ulcerative colitis, type 2 diabetes, chronic ulcer, venous stasis, tobacco abuse, obesity who presents today for follow up of diabetes.  Current medications include: Lantus 25 units HS, Metformin 1000 mg BID. He completed his prednisone in February 2021.   He is checking his blood glucose 2 times daily and is getting readings of:  AM fasting: low 100's After dinner: mid 200's  Last A1C: 11.1 Last Eye Exam: UTD Last Foot Exam: Due Pneumonia Vaccination: Never completed.  ACE/ARB: None. Urine microalbumin negative in January 2021 Statin: Lipitor  BP Readings from Last 3 Encounters:  09/08/19 122/82  08/06/19 106/69  07/21/19 111/79   He continues to follow with dermatology through [facility] for chronic ulcer, recently underwent antibiotics via PICC line.   He's been trying to work on his diet, does fast food for lunch, home cooked meals for dinner. Sweets daily over the last couple weeks.    Review of Systems  Eyes: Negative for visual disturbance.  Cardiovascular: Negative for chest pain.  Neurological: Negative for dizziness and numbness.       Past Medical History:  Diagnosis Date  . Blood in stool   . Depression   . Elevated blood pressure   . Hyperlipidemia   . OSA (obstructive sleep apnea) 2014  . Seasonal allergies   . Ulcerative colitis ([place]) 03/05/2018     Social History   Socioeconomic History  . Marital status: Married    Spouse name: Not on file  . Number of children: Not on file  . Years of education: Not on file    . Highest education level: Not on file  Occupational History  . Not on file  Tobacco Use  . Smoking status: Former Smoker    Packs/day: 2.00    Years: 14.00    Pack years: 28.00    Types: Cigarettes    Quit date: 11/25/2018    Years since quitting: 0.7  . Smokeless tobacco: Former Systems developer    Quit date: 03/17/2015  Substance and Sexual Activity  . Alcohol use: Yes    Alcohol/week: 0.0 standard drinks    Comment: soical  . Drug use: Not on file  . Sexual activity: Not on file  Other Topics Concern  . Not on file  Social History Narrative   Married.   7 kids.   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   Enjoys target shooting.    Social Determinants of Health   Financial Resource Strain:   . Difficulty of Paying Living Expenses:   Food Insecurity:   . Worried About Charity fundraiser in the Last Year:   . Arboriculturist in the Last Year:   Transportation Needs:   . Film/video editor (Medical):   Marland Kitchen Lack of Transportation (Non-Medical):   Physical Activity:   . Days of Exercise per Week:   . Minutes of Exercise per Session:   Stress:   . Feeling of Stress :   Social Connections:   . Frequency of Communication with Friends and  Family:   . Frequency of Social Gatherings with Friends and Family:   . Attends Religious Services:   . Active Member of Clubs or Organizations:   . Attends Archivist Meetings:   Marland Kitchen Marital Status:   Intimate Partner Violence:   . Fear of Current or Ex-Partner:   . Emotionally Abused:   Marland Kitchen Physically Abused:   . Sexually Abused:     Past Surgical History:  Procedure Laterality Date  . SEPTOPLASTY  2007  . WRIST SURGERY      Family History  Problem Relation Age of Onset  . Alcohol abuse Paternal Aunt   . Alcohol abuse Paternal Uncle   . Stroke Paternal Uncle   . Hyperlipidemia Maternal Grandfather   . Hypertension Maternal Grandfather   . Diabetes Maternal Grandfather   . Alcohol abuse Maternal Grandfather   . Multiple  sclerosis Mother   . Dementia Mother     Allergies  Allergen Reactions  . Biaxin [Clarithromycin] Other (See Comments)    Causes colitis flares  . Milk-Related Compounds Hives    Current Outpatient Medications on File Prior to Visit  Medication Sig Dispense Refill  . acetaminophen (TYLENOL) 500 MG tablet Take 1,000 mg by mouth every 6 (six) hours as needed for headache (pain).    Marland Kitchen atorvastatin (LIPITOR) 40 MG tablet Take 1 tablet (40 mg total) by mouth every evening. For cholesterol. 90 tablet 3  . blood glucose meter kit and supplies KIT Dispense based on patient and insurance preference. Use up to four times daily as directed. (FOR ICD-9 250.00, 250.01). 1 each 0  . cefTAZidime 2 g in dextrose 5 % 50 mL Inject 2 g into the vein every 8 (eight) hours.    . clobetasol ointment (TEMOVATE) 3.88 % Apply 1 application topically 2 (two) times daily.     . fluticasone (FLONASE) 50 MCG/ACT nasal spray Place 1 spray into both nostrils 2 (two) times daily. 16 g 6  . HUMIRA PEN 40 MG/0.8ML PNKT Inject 1 Syringe into the skin every 14 (fourteen) days.    Marland Kitchen ibuprofen (ADVIL,MOTRIN) 200 MG tablet Take 400-600 mg by mouth every 6 (six) hours as needed for headache (pain).    . insulin glargine (LANTUS SOLOSTAR) 100 UNIT/ML Solostar Pen Inject 20 Units into the skin at bedtime. 5 pen 2  . Insulin Pen Needle (PEN NEEDLES) 31G X 6 MM MISC Use nightly with insulin 100 each 2  . metFORMIN (GLUCOPHAGE) 1000 MG tablet Take 1 tablet (1,000 mg total) by mouth 2 (two) times daily with a meal. For diabetes. 180 tablet 3  . mupirocin ointment (BACTROBAN) 2 % Apply topically two (2) 60g times a day. Mix with clobetasol and apply twice daily    . venlafaxine XR (EFFEXOR-XR) 150 MG 24 hr capsule TAKE 1 CAPSULE BY MOUTH ONCE DAILY WITH BREAKFAST 90 capsule 1  . venlafaxine XR (EFFEXOR-XR) 37.5 MG 24 hr capsule Take 1 capsule (37.5 mg total) by mouth daily with breakfast. For depression 90 capsule 1   No current  facility-administered medications on file prior to visit.    BP 122/82   Pulse 79   Temp (!) 96.5 F (35.8 C) (Temporal)   Ht _0  (1.803 m)   Wt (!) 346 lb 4 oz (157.1 kg)   SpO2 98%   BMI 48.29 kg/m    Objective:   Physical Exam  Constitutional: He appears well-nourished.  Cardiovascular: Normal rate and regular rhythm.  Respiratory: Effort normal and breath  sounds normal.  Musculoskeletal:     Cervical back: Neck supple.  Skin: Skin is warm and dry.           Assessment & Plan:

## 2019-09-08 NOTE — Patient Instructions (Signed)
Switch Lantus 25 units to the morning. Continue Metformin.  Please notify me if your blood sugars remain at or above 200 consistently.   It is important that you improve your diet. Please limit carbohydrates in the form of white bread, rice, pasta, sweets, fast food, fried food, sugary drinks, etc. Increase your consumption of fresh fruits and vegetables, whole grains, lean protein.  Ensure you are consuming 64 ounces of water daily.  Please schedule a follow up appointment in 3-4 months.  It was a pleasure to see you today!   Diabetes Mellitus and Nutrition, Adult When you have diabetes (diabetes mellitus), it is very important to have healthy eating habits because your blood sugar (glucose) levels are greatly affected by what you eat and drink. Eating healthy foods in the appropriate amounts, at about the same times every day, can help you:  Control your blood glucose.  Lower your risk of heart disease.  Improve your blood pressure.  Reach or maintain a healthy weight. Every person with diabetes is different, and each person has different needs for a meal plan. Your health care provider may recommend that you work with a diet and nutrition specialist (dietitian) to make a meal plan that is best for you. Your meal plan may vary depending on factors such as:  The calories you need.  The medicines you take.  Your weight.  Your blood glucose, blood pressure, and cholesterol levels.  Your activity level.  Other health conditions you have, such as heart or kidney disease. How do carbohydrates affect me? Carbohydrates, also called carbs, affect your blood glucose level more than any other type of food. Eating carbs naturally raises the amount of glucose in your blood. Carb counting is a method for keeping track of how many carbs you eat. Counting carbs is important to keep your blood glucose at a healthy level, especially if you use insulin or take certain oral diabetes  medicines. It is important to know how many carbs you can safely have in each meal. This is different for every person. Your dietitian can help you calculate how many carbs you should have at each meal and for each snack. Foods that contain carbs include:  Bread, cereal, rice, pasta, and crackers.  Potatoes and corn.  Peas, beans, and lentils.  Milk and yogurt.  Fruit and juice.  Desserts, such as cakes, cookies, ice cream, and candy. How does alcohol affect me? Alcohol can cause a sudden decrease in blood glucose (hypoglycemia), especially if you use insulin or take certain oral diabetes medicines. Hypoglycemia can be a life-threatening condition. Symptoms of hypoglycemia (sleepiness, dizziness, and confusion) are similar to symptoms of having too much alcohol. If your health care provider says that alcohol is safe for you, follow these guidelines:  Limit alcohol intake to no more than 1 drink per day for nonpregnant women and 2 drinks per day for men. One drink equals 12 oz of beer, 5 oz of wine, or 1 oz of hard liquor.  Do not drink on an empty stomach.  Keep yourself hydrated with water, diet soda, or unsweetened iced tea.  Keep in mind that regular soda, juice, and other mixers may contain a lot of sugar and must be counted as carbs. What are tips for following this plan?  Reading food labels  Start by checking the serving size on the "Nutrition Facts" label of packaged foods and drinks. The amount of calories, carbs, fats, and other nutrients listed on the label is based on  one serving of the item. Many items contain more than one serving per package.  Check the total grams (g) of carbs in one serving. You can calculate the number of servings of carbs in one serving by dividing the total carbs by 15. For example, if a food has 30 g of total carbs, it would be equal to 2 servings of carbs.  Check the number of grams (g) of saturated and trans fats in one serving. Choose foods  that have low or no amount of these fats.  Check the number of milligrams (mg) of salt (sodium) in one serving. Most people should limit total sodium intake to less than 2,300 mg per day.  Always check the nutrition information of foods labeled as "low-fat" or "nonfat". These foods may be higher in added sugar or refined carbs and should be avoided.  Talk to your dietitian to identify your daily goals for nutrients listed on the label. Shopping  Avoid buying canned, premade, or processed foods. These foods tend to be high in fat, sodium, and added sugar.  Shop around the outside edge of the grocery store. This includes fresh fruits and vegetables, bulk grains, fresh meats, and fresh dairy. Cooking  Use low-heat cooking methods, such as baking, instead of high-heat cooking methods like deep frying.  Cook using healthy oils, such as olive, canola, or sunflower oil.  Avoid cooking with butter, cream, or high-fat meats. Meal planning  Eat meals and snacks regularly, preferably at the same times every day. Avoid going long periods of time without eating.  Eat foods high in fiber, such as fresh fruits, vegetables, beans, and whole grains. Talk to your dietitian about how many servings of carbs you can eat at each meal.  Eat 4-6 ounces (oz) of lean protein each day, such as lean meat, chicken, fish, eggs, or tofu. One oz of lean protein is equal to: ? 1 oz of meat, chicken, or fish. ? 1 egg. ?  cup of tofu.  Eat some foods each day that contain healthy fats, such as avocado, nuts, seeds, and fish. Lifestyle  Check your blood glucose regularly.  Exercise regularly as told by your health care provider. This may include: ? 150 minutes of moderate-intensity or vigorous-intensity exercise each week. This could be brisk walking, biking, or water aerobics. ? Stretching and doing strength exercises, such as yoga or weightlifting, at least 2 times a week.  Take medicines as told by your  health care provider.  Do not use any products that contain nicotine or tobacco, such as cigarettes and e-cigarettes. If you need help quitting, ask your health care provider.  Work with a Social worker or diabetes educator to identify strategies to manage stress and any emotional and social challenges. Questions to ask a health care provider  Do I need to meet with a diabetes educator?  Do I need to meet with a dietitian?  What number can I call if I have questions?  When are the best times to check my blood glucose? Where to find more information:  American Diabetes Association: diabetes.org  Academy of Nutrition and Dietetics: www.eatright.[employer] of Diabetes and Digestive and Kidney Diseases (NIH): [employer] Summary  A healthy meal plan will help you control your blood glucose and maintain a healthy lifestyle.  Working with a diet and nutrition specialist (dietitian) can help you make a meal plan that is best for you.  Keep in mind that carbohydrates (carbs) and alcohol have immediate effects on your  blood glucose levels. It is important to count carbs and to use alcohol carefully. This information is not intended to replace advice given to you by your health care provider. Make sure you discuss any questions you have with your health care provider. Document Revised: 04/18/2017 Document Reviewed: 06/10/2016 Elsevier Patient Education  2020 [employer].

## 2019-09-09 ENCOUNTER — Ambulatory Visit: Payer: 59 | Admitting: Internal Medicine

## 2019-09-13 ENCOUNTER — Encounter: Payer: 59 | Admitting: Physician Assistant

## 2019-09-13 ENCOUNTER — Other Ambulatory Visit: Payer: Self-pay

## 2019-09-13 DIAGNOSIS — L97222 Non-pressure chronic ulcer of left calf with fat layer exposed: Secondary | ICD-10-CM | POA: Diagnosis not present

## 2019-09-13 NOTE — Progress Notes (Addendum)
MIKEN, STECHER ([id number]) Visit Report for 09/13/2019 Chief Complaint Document Details Patient Name: [name], [name] Date of Service: 09/13/2019 8:15 AM Medical Record [id number] Patient Account Number: [id number] Date of Birth/Sex: 1980/09/01 (39 y.o. 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 M) Treating RN: Army Melia Primary Care Provider: [name] Other Clinician: Referring Provider: [name] Treating Provider/Extender:STONE III, Sandy Haye Weeks in Treatment: 144 History of Present Illness HPI Description: 12/04/16; 39 year old man who comes into the clinic today for review of a wound on the posterior left calf. He tells me that is been there for about a year. He is not a diabetic he does smoke half a pack per day. He was seen in the ER on 11/20/16 felt to have cellulitis around the wound and was given clindamycin. An x-ray did not show osteomyelitis. The patient initially tells me that he has a milk allergy that sets off a pruritic itching rash on his lower legs which she scratches incessantly and he thinks that's what may have set up the wound. He has been using various topical antibiotics and ointments without any effect. He works in a [employer] and is on his feet all day. He  does not have a prior history of wounds however he does have the rash on both lower legs the right arm and the ventral aspect of his left arm. These are excoriations and clearly have had scratching however there are of macular looking areas on both legs including a substantial larger area on the right leg. This does not have an underlying open area. There is no blistering. The patient tells me that 2 years ago in [place] in response to the rash on his legs he saw a dermatologist who told him he had a condition which may be pyoderma gangrenosum although I may be putting words into his mouth. He seemed to recognize this. 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 He is not in any treatment for this. He's had no recent travel 12/11/16; the patient arrives today with his wound and roughly the same condition we've been using silver alginate this is a deep punched out wound with some surrounding erythema but no tenderness. Biopsy I did did not show confirmed pyoderma gangrenosum suggested nonspecific inflammation and vasculitis but does not provide an actual description of what was seen by the pathologist. I'm really not able to understand this We have also received information from the patient's dermatologist in [place] notes from April 2016. This was a doctor Agarwal-antal. 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 He was prescribed topical steroid high potency under occlusion which helped but at this point the patient did not have a deep punched out wound. 12/18/16; the patient's wound is larger in terms of surface area however this surface looks better and there is less depth. The surrounding erythema also is better. The patient states that the wrap we put on came off 2 days ago when he has been using his compression  stockings. He we are in the process of getting a dermatology consult. 12/26/16 on evaluation today patient's left lower extremity wound  shows evidence of infection with surrounding erythema noted. He has been tolerating the dressing changes but states that he has noted more discomfort. There is a larger area of erythema surrounding the wound. No fevers, chills, nausea, or vomiting noted at this time. With that being said the wound still does have slough covering the surface. He is not allergic to any medication that he is aware of at this point. In regard to his right lower extremity he had several regions that are erythematous and pruritic he wonders if there's anything we can do to help that. 01/02/17 I reviewed patient's wound culture which was obtained his visit last week. He was placed on doxycycline at that point. Unfortunately that does not appear to be an antibiotic that would likely help with the situation however the pseudomonas noted on culture is sensitive to Cipro. Also unfortunately patient's wound seems to have a large compared to last week's evaluation. Not severely so but there are definitely increased measurements in general. He is continuing to have discomfort as well he writes this to be a seven out of 10. In fact he would prefer me not to perform any debridement today due to the fact that he is having discomfort and considering he has an active infection on the little reluctant to do so anyway. No fevers, chills, nausea, or vomiting noted at this time. 01/08/17; patient seems dermatology on September 5. I suspect dermatology will want the slides from the biopsy I did sent to their pathologist. I'm not sure if there is a way we can expedite that. In any case the culture I did before I left on vacation 3 weeks ago showed Pseudomonas he was given 10 days of Cipro and per her description of her intake nurses is actually somewhat better this week although the wound is quite a bit bigger than I remember the last time I saw this. He still has 3 more days of Cipro 01/21/17; dermatology appointment tomorrow. He has completed  the ciprofloxacin for Pseudomonas. Surface of the wound looks better however he is had some deterioration in the lesions on his right leg. Meantime the left lateral leg wound we will continue with sample 01/29/17; patient had his dermatology appointment but I can't yet see that note. He is completed his antibiotics. The wound is more superficial but considerably larger in circumferential area than when he came in. This is in his left lateral calf. He also has swollen erythematous areas with superficial wounds on the right leg and small papular areas on both arms. There apparently areas in her his upper thighs and buttocks I did not look at those. Dermatology biopsied the right leg. Hopefully will have their input next week. 02/05/17; patient went back to see his dermatologist who told him that he had a "scratching problem" as well as staph. He is now on a 30 day course of doxycycline and I believe she gave him triamcinolone cream to the right leg areas to help with the itching [not exactly sure but probably triamcinolone]. She apparently looked at the left lateral leg wound although this was not rebiopsied and I think felt to be ultimately part of the same pathogenesis. He is using sample border foam and changing nevus himself. He now has a new open area on the right posterior leg which was his biopsy site I don't have any of the dermatology  notes 02/12/17; we put the patient in compression last week with SANTYL to the wound on the left leg and the biopsy. Edema is much better and the depth of the wound is now at level of skin. Area is still the same oBiopsy site on the right lateral leg we've also been using santyl with a border foam dressing and he is changing this himself. 02/19/17; Using silver alginate started last week to both the substantial left leg wound and the biopsy site on the right wound. He is tolerating compression well. Has a an appointment with his primary M.D. tomorrow wondering about  diuretics although I'm wondering if the edema problem is actually lymphedema 02/26/17; the patient has been to see his primary doctor Dr. Jerrel Ivory at [place] our primary care. She started him on Lasix 20 mg and this seems to have helped with the edema. However we are not making substantial change with the left lateral calf wound and inflammation. The biopsy site on the right leg also looks stable but not really all that different. 03/12/17; the patient has been to see vein and vascular Dr. Lucky Cowboy. He has had venous reflux studies I have not reviewed these. I did get a call from his dermatology office. 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 They therefore recommended clobetasol and tetracycline i.e. 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 03/26/17; apparently the patient just had reflux studies not an appointment with Dr. dew. She arrives in clinic today having applied clobetasol for 2-3 weeks. He notes over the last 2-3 days excessive drainage having to change the dressing 3-4 times a day and also expanding erythema. He states the expanding erythema seems to come and go and was last this red was earlier in the month.he is on doxycycline 150 mg twice a day as an anti- inflammatory systemic therapy for possible pyoderma gangrenosum along with the topical clobetasol 04/02/17; the patient was seen last week by Dr. Lillia Carmel at [facility] dermatology locally who kindly saw him at my request. 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 My concern was the degree of erythema medially extending from his left leg wound which was either inflammation from pyoderma or  cellulitis. I put him on Augmentin however culture of the wound showed Pseudomonas which is quinolone sensitive. I really don't believe he has cellulitis however in view of everything I will continue and give him a course of Cipro. He is also on doxycycline as an immune modulator for the pyoderma. In addition to his original wound on the left lateral leg with surrounding erythema he has a wound on the right posterior calf which was an original biopsy site done by dermatology. 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 Saw Dr. Lillia Carmel yesterday. He has been using topical antibiotics to both wound areas his original wound on the left and the biopsies/pathergy area on the right. There is definitely some improvement in the inflammation around the wound on the right although the patient states he has increasing sensitivity of the wounds. He is on prednisone 60 and doxycycline 1 as prescribed by Dr. Lillia Carmel. He is covering the topical antibiotic with gauze and  putting this in his own compression stocks and changing this daily. He states that Dr. Lottie Rater did a culture of the left leg wound yesterday 05/07/17; pyoderma gangrenosum. The patient saw Dr. Lillia Carmel yesterday and has a follow-up with her in one month. He is still using topical antibiotics to both wounds although he can't recall exactly what type. He is still on prednisone 60 mg. Dr. Lillia Carmel stated that the doxycycline could stop if we were in agreement. 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 The right medial leg was induced by biopsy/pathergy. The area on the right is essentially healed. 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 The right medial leg has closed and remains closed. He is  still on prednisone 60. oHe tells me he missed his last dermatology appointment with Dr. Lillia Carmel but will make another appointment. He reports that her blood sugar at a [name], [name] ([id number]) recent screen in [place] was high 200's. He was 180 today. 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 I think he is going to require still much longer prednisone perhaps another 3 months before attempting to taper. In the meantime his wound is a lot better. Smaller. He is cleaning this off daily and applying topical antibiotics. When he was last in the clinic I thought about changing to [name] and actually put in a couple of calls to dermatology although probably not during their business hours. 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 He continues on oral prednisone. He has been using triple antibiotic ointment. There is surface debris and we will transition to [facility] and have him return in 2 weeks. He has lost 30 pounds since his last appointment with lifestyle modification. He may benefit from topical steroid cream for treatment this can be considered at a later date. 08/22/17 on evaluation today patient appears to actually be doing rather well in regard to his left lateral lower extremity ulcer. He has actually been managed by Dr. Dellia Nims most recently. Patient is currently on oral steroids at this time. This seems to have been of benefit for him. Nonetheless his last visit was actually with Leah on 08/06/17. Currently he is not utilizing any topical steroid creams although this could be of benefit as well. No fevers, chills, nausea, or vomiting noted at this time. 09/05/17 on evaluation today patient appears to be doing better in regard to his left lateral lower extremity ulcer. He has been tolerating the dressing changes without complication. He is using Santyl with  good effect. Overall I'm very pleased with how things are standing at this point. Patient likewise is happy that this is doing better. 09/19/17 on evaluation today patient actually appears to be doing rather well in regard to his left lateral lower extremity ulcer. 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 He's not having any significant pain. 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 He has been tolerating the Santyl without complication and in general I feel like he's making good progress. 10/17/17 on evaluation  today patient appears to be doing very well in regard to his left lateral lower surety ulcer. He has been tolerating the dressing changes without complication. There does not appear to be any evidence of infection he's alternating the Santyl and the triple antibiotic ointment every other day this seems to be doing well for him. 11/03/17 on evaluation today patient appears to be doing very well in regard to his left lateral lower extremity ulcer. He is been tolerating the dressing changes without complication which is good news. Fortunately there does not appear to be any evidence of infection which is also great news. Overall is doing excellent they are starting to taper down on the prednisone is down to 40 mg at this point it also started topical clobetasol for him. 11/17/17 on evaluation today patient appears to be doing well in regard to his left lateral lower surety ulcer. He's been tolerating the dressing changes without complication. He does note that he is having no pain, no excessive drainage or discharge, and overall he feels like things are going about how he would expect and hope they would. Overall he seems to have no evidence of infection at this time in my opinion which is good news. 12/04/17-He is  seen in follow-up evaluation for right lateral lower extremity ulcer. He has been applying topical steroid cream. Today's measurement show slight increase in size. Over the next 2 weeks we will transition to every other day Santyl and steroid cream. He has been encouraged to monitor for changes and notify clinic with any concerns 12/15/17 on evaluation today patient's left lateral motion the ulcer and fortunately is doing worse again at this point. This just since last week to this week has close to doubled in size according to the patient. I did not seeing last week's I do not have a visual to compare this to in our system was also down so we do not have all the charts and at this point. Nonetheless it does have me somewhat concerned in regard to the fact that again he was worried enough about it he has contact the dermatology that placed them back on the full strength, 50 mg a day of the prednisone that he was taken previous. He continues to alternate using clobetasol along with Santyl at this point. He is obviously somewhat frustrated. 12/22/17 on evaluation today patient appears to be doing a little worse compared to last evaluation. Unfortunately the wound is a little deeper and slightly larger than the last week's evaluation. With that being said he has made some progress in regard to the irritation surrounding at this time unfortunately despite that progress that's been made he still has a significant issue going on here. 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 For that reason I discussed with him today the possibility of perform a wound culture to ensure there's nothing overtly infected. 01/06/18 on evaluation today patient's wound is larger and deeper than previously evaluated. With that being said it did appear that his wound was infected after  my last evaluation with him. Subsequently I did end up prescribing a prescription for Bactrim DS which she has been taking and having no complication with. Fortunately there does not appear to be any evidence of infection at this point in time as far as anything spreading, no want to touch, and overall I feel like things  are showing signs of improvement. 01/13/18 on evaluation today patient appears to be even a little larger and deeper than last time. There still muscle exposed in the base of the wound. Nonetheless he does appear to be less erythematous I do believe inflammation is calming down also believe the infection looks like it's probably resolved at this time based on what I'm seeing. No fevers, chills, nausea, or vomiting noted at this time. 01/30/18 on evaluation today patient actually appears to visually look better for the most part. Unfortunately those visually this looks better he does seem to potentially have what may be an abscess in the muscle that has been noted in the central portion of the wound. This is the first time that I have noted what appears to be fluctuance in the central portion of the muscle. With that being said I'm somewhat more concerned about the fact that this might indicate an abscess formation at this location. I do believe that an ultrasound would be appropriate. This is likely something we need to try to do as soon as possible. He has been switch to mupirocin ointment and he is no longer using the steroid ointment as prescribed by dermatology he sees them again next week he's been decreased from 60 to 40 mg of prednisone. 03/09/18 on evaluation today patient actually appears to be doing a little better compared to last time I saw him. There's not as much erythema surrounding the wound itself. He I did review his most recent infectious disease note which was dated 02/24/18. He saw Dr. Michel Bickers in [place]. 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 Patient is now off of antibiotics and they are continually observing this. There seems to be no change in the past two weeks in my pinion based on what the patient says and what I see today compared to what Dr. Megan Salon likely saw two weeks ago. No fevers, chills, nausea, or vomiting noted at this time. 03/23/18 on evaluation today patient's wound actually appears to be showing signs of improvement which is good news. He is currently still on the Dapsone. He is also working on tapering the prednisone to get off of this and Dr. Lottie Rater is working with him in this regard. Nonetheless overall I feel like the wound is doing well it does appear based on the infectious disease note that I reviewed from Dr. 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 I did also review the results of his ultrasound of left lower extremity which revealed there was a dentist tissue in the base of the wound without an abscess noted. 04/06/18 on evaluation today the patient's left lateral lower extremity ulcer actually appears to be doing fairly well which is excellent news. There does not appear to be any evidence of infection at this time which is also great news. Overall he still does have a significantly large ulceration although little by little he seems to be making progress. He is down to 10 mg a day of the prednisone. 04/20/18 on evaluation today patient actually appears to be doing excellent at this time in regard to his left lower extremity ulcer. He's making signs of good progress unfortunately this is taking much longer than we would really like to see but nonetheless he is making  progress. Fortunately there does not appear to be any evidence of infection at this time. No fevers, chills, nausea, or vomiting noted at this time. The  patient has not been using the Santyl due to the cost he hadn't got in this field yet. He's mainly been using the antibiotic ointment topically. Subsequently he also tells me that he really has not been scrubbing in the shower I think this would be helpful again as I told him it doesn't have to be anything too aggressive to even make it believe just enough to keep it free of some of the loose slough and biofilm on the wound surface. 05/11/18 on evaluation today patient's wound appears to be making slow but sure progress in regard to the left lateral lower extremity ulcer. He is been tolerating the dressing changes without complication. Fortunately there does not appear to be any evidence of infection at this time. He is still just using triple antibiotic ointment along with clobetasol occasionally over the area. He never got the Santyl and really does not seem to intend to in my pinion. 06/01/18 on evaluation today patient appears to be doing a little better in regard to his left lateral lower extremity ulcer. He states that overall he does not feel like he is doing as well with the Dapsone as he did with the prednisone. Nonetheless he sees his dermatologist later today and is gonna talk to them about the possibility of going back on the prednisone. Overall again I believe that the wound would be better if you would utilize Santyl but he really does not seem to be interested in going back to the [place] at this point. He has been using triple antibiotic ointment. 06/15/18 on evaluation today patient's wound actually appears to be doing about the same at this point. Fortunately there is no signs of infection at this time. He has made slight improvements although he continues to not really want to clean the wound bed at this point. He states that he just doesn't mess with it he doesn't want to cause any problems with everything else he has going on. He has been on medication, antibiotics as prescribed by  his dermatologist, for a staff infection of his lower extremities which is really drying out now and looking much better he tells me. Fortunately there is no sign of overall infection. 06/29/18 on evaluation today patient appears to be doing well in regard to his left lateral lower surety ulcer all things considering. Fortunately his staff infection seems to be greatly improved compared to previous. He has no signs of infection and this is drying up quite nicely. He is still the doxycycline for this is no longer on cental, Dapsone, or any of the other medications. His dermatologist has recommended possibility of an infusion but right now he does not want to proceed with that. 07/13/18 on evaluation today patient appears to be doing about the same in regard to his left lateral lower surety ulcer. Fortunately there's no signs of infection at this time which is great news. Unfortunately he still builds up a significant amount of Slough/biofilm of the surface of the wound he still is not really cleaning this as he should be appropriately. Again I'm able to easily with saline and gauze remove the majority of this on the surface which if you would do this at home would likely be a dramatic improvement for him as far as getting the area to improve. Nonetheless overall I still feel like he is  making progress is just very slow. I think Santyl will be of benefit for him as well. Still he has not gotten this as of this point. [name], [name] ([id number]) 07/27/18 on evaluation today patient actually appears to be doing little worse in regards of the erythema around the periwound region of the wound he also tells me that he's been having more drainage currently compared to what he was experiencing last time I saw him. He states not quite as bad as what he had because this was infected previously but nonetheless is still appears to be doing poorly. Fortunately there is no evidence of systemic infection at this point.  The patient tells me that he is not going to be able to afford the Santyl. He is still waiting to hear about the infusion therapy with his dermatologist. Apparently she wants an updated colonoscopy first. 08/10/18 on evaluation today patient appears to be doing better in regard to his left lateral lower extremity ulcer. Fortunately he is showing signs of improvement in this regard he's actually been approved for Remicade infusion's as well although this has not been scheduled as of yet. Fortunately there's no signs of active infection at this time in regard to the wound although he is having some issues with infection of the right lower extremity is been seen as dermatologist for this. Fortunately they are definitely still working with him trying to keep things under control. 09/07/18 on evaluation today patient is actually doing rather well in regard to his left lateral lower extremity ulcer. He notes these actually having some hair grow back on his extremity which is something he has not seen in years. He also tells me that the pain is really not giving them any trouble at this time which is also good news overall she is very pleased with the progress he's using a combination of the mupirocin along with the probate is all mixed. 09/21/18 on evaluation today patient actually appears to be doing fairly well all things considered in regard to his looks from the ulcer. He's been tolerating the dressing changes without complication. Fortunately there's no signs of active infection at this time which is good news he is still on all antibiotics or prevention of the staff infection. He has been on prednisone for time although he states it is gonna contact his dermatologist and see if she put them on a short course due to some irritation that he has going on currently. Fortunately there's no evidence of any overall worsening this is going very slow I think cental would be something that would be helpful for him  although he states that $50 for tube is quite expensive. He therefore is not willing to get that at this point. 10/06/18 on evaluation today patient actually appears to be doing decently well in regard to his left lateral leg ulcer. He's been tolerating the dressing changes without complication. Fortunately there's no signs of active infection at this time. Overall I'm actually rather pleased with the progress he's making although it's slow he doesn't show any signs of infection and he does seem to be making some improvement. I do believe that he may need a switch up and dressings to try to help this to heal more appropriately and quickly. 10/19/18 on evaluation today patient actually appears to be doing better in regard to his left lateral lower extremity ulcer. This is shown signs of having much less Slough buildup at this point due to the fact he has been using the  Santyl. Obviously this is very good news. The overall size of the wound is not dramatically smaller but again the appearance is. 11/02/18 on evaluation today patient actually appears to be doing quite well in regard to his lower [place] ulcer. A lot of the skin around the ulcer is actually somewhat irritating at this point this seems to be more due to the dressing causing irritation from the adhesive that anything else. Fortunately there is no signs of active infection at this time. 11/24/18 on evaluation today patient appears to be doing a little worse in regard to his overall appearance of his lower extremity ulcer. There's more erythema and warmth around the wound unfortunately. He is currently on doxycycline which he has been on for some time. With that being said I'm not sure that seems to be helping with what appears to possibly be an acute cellulitis with regard to his left lower extremity ulcer. No fevers, chills, nausea, or vomiting noted at this time. 12/08/18 on evaluation today patient's wounds actually appears to be doing  significantly better compared to his last evaluation. He has been using Santyl along with alternating tripling about appointment as well as the steroid cream seems to be doing quite well and the wound is showing signs of improvement which is excellent news. Fortunately there's no evidence of infection and in fact his culture came back negative with only normal skin flora noted. 12/21/2018 upon evaluation today patient actually appears to be doing excellent with regard to his ulcer. This is actually the best that I have seen it since have been helping to take care of him. It is both smaller as well as less slough noted on the surface of the wound and seems to be showing signs of good improvement with new skin growing from the edges. He has been using just the triamcinolone he does wonder if he can get a refill of that ointment today. 01/04/2019 upon evaluation today patient actually appears to be doing well with regard to his left lateral lower extremity ulcer. With that being said it does not appear to be that he is doing quite as well as last time as far as progression is concerned. There does not appear to be any signs of infection or significant irritation which is good news. With that being said I do believe that he may benefit from switching to a collagen based dressing based on how clean The wound appears. 01/18/2019 on evaluation today patient actually appears to be doing well with regard to his wound on the left lower extremity. He is not made a lot of progress compared to where we were previous but nonetheless does seem to be doing okay at this time which is good news. There is no signs of active infection which is also good news. My only concern currently is I do wish we can get him into utilizing the collagen dressing his insurance would not pay for the supplies that we ordered although it appears that he may be able to order this through his supply company that he typically utilizes. This  is [employer]. Nonetheless he did try to order it during the office visit today and it appears this did go through. We will see if he can get that it is a different brand but nonetheless he has collagen and I do think will be beneficial. 02/01/2019 on evaluation today patient actually appears to be doing a little worse today in regard to the overall size of his wounds. Fortunately there is  no signs of active infection at this time. That is visually. Nonetheless when this is happened before it was due to infection. For that reason were somewhat concerned about that this time as well. 02/08/2019 on evaluation today patient unfortunately appears to be doing slightly worse with regard to his wound upon evaluation today. Is measuring a little deeper and a little larger unfortunately. I am not really sure exactly what is causing this to enlarge he actually did see his dermatologist she is going to see about initiating Humira for him. Subsequently she also did do steroid injections into the wound itself in the periphery. Nonetheless still nonetheless he seems to be getting a little bit larger he is gone back to just using the steroid cream topically which I think is appropriate. I would say hold off on the collagen for the time being is definitely a good thing to do. Based on the culture results which we finally did get the final result back regarding it shows staph as the bacteria noted again that can be a normal skin bacteria based on the fact however he is having increased drainage and worsening of the wound measurement wise I would go ahead and place him on an antibiotic today I do believe for this. 02/15/2019 on evaluation today patient actually appears to be doing somewhat better in regard to his ulcer. There is no signs of worsening at this time I did review his culture results which showed evidence of Staphylococcus aureus but not MRSA. Again this could just be more related to the normal skin bacteria  although he states the drainage has slowed down quite a bit he may have had a mild infection not just colonization. And was much smaller and then since around10/04/2019 on evaluation today patient appears to be doing unfortunately worse as far as the size of the wound. I really feel like that this is steadily getting larger again it had been doing excellent right at the beginning of September we have seen a steady increase in the area of the wound it is almost 2-1/2 times the size it was on September 1. Obviously this is a bad trend this is not wanting to see. For that reason we went back to using just the topical triamcinolone cream which does seem to help with inflammation. I checked him for bacteria by way of culture and nothing showed positive there. I am considering giving him a short course of a tapering steroid Dosepak today to see if that is can be beneficial for him. The patient is in agreement with giving that a try. 03/08/2019 on evaluation today patient appears to be doing very well in comparison to last evaluation with regard to his lower extremity ulcer. This is showing signs of less inflammation and actually measuring slightly smaller compared to last time every other week over the past month and a half he has been measuring larger larger larger. Nonetheless I do believe that the issue has been inflammation the prednisone does seem to have been beneficial for him which is good news. No fevers, chills, nausea, vomiting, or diarrhea. 03/22/2019 on evaluation today patient appears to be doing about the same with regard to his leg ulcer. He has been tolerating the dressing changes without complication. With that being said the wound seems to be mostly arrested at its current size but really is not making any progress except for when we prescribed the prednisone. He did show some signs of dropping as far as the overall size of  the wound during that interval week. Nonetheless this is something  he is not on long-term at this point and unfortunately I think he is getting need either this or else the Humira which his dermatologist has discussed try to get approval for. With that being said he will be seeing his dermatologist on the 11th of this month that is November. 04/19/2019 on evaluation today patient appears to be doing really about the same the wound is measuring slightly larger compared to last time I saw him. He has not been into the office since November 2 due to the fact that he unfortunately had Covid as that his entire family. He tells me that it was rough but they did pull-through and he seems to be doing much better. Fortunately there is no signs of active infection at this time. No fevers, chills, nausea, vomiting, or diarrhea. 05/10/2019 on evaluation today patient unfortunately appears to be doing significantly worse as compared to last time I saw him. He does tell me that he has had his first dose of Humira and actually is scheduled to get the next one in the upcoming week. With that being said he tells me also that in the past several days he has been having a lot of issues with green drainage she showed me a picture this is more blue-green in color. He is also been having issues with increased sloughy buildup and the wound does appear to be larger today. Obviously this is not the direction that we want everything to take based on the starting of his Humira. Nonetheless I think this is definitely a result of likely infection and to be honest I think this is probably Pseudomonas causing the infection based on what I am seeing. 05/24/2019 on evaluation today patient unfortunately appears to be doing significantly worse compared to his prior evaluation with me 2 weeks ago. I did review his culture results which showed that he does have Staph aureus as well as Pseudomonas noted on the culture. Nonetheless the Levaquin that I prescribed for him does not appear to have been  appropriate and in fact he tells me he is no longer experiencing the green drainage and discharge that he had at the last visit. Fortunately there is no signs of active infection at this time which is good news although the wound has significantly worsened it in fact is much deeper than it was previous. We have been utilizing up to this point triamcinolone ointment as the prescription topical of choice but at this [name], [name] ([id number]) time I really feel like that the wound is getting need to be packed in order to appropriately manage this due to the deeper nature of the wound. Therefore something along the lines of an alginate dressing may be more appropriate. 05/31/2019 upon inspection today patient's wound actually showed signs of doing poorly at this point. Unfortunately he just does not seem to be making any good progress despite what we have tried. He actually did go ahead and pick up the Cipro and start taking that as he was noticing more green drainage he had previously completed the Levaquin that I prescribed for him as well. Nonetheless he missed his appointment for the seventh last week on Wednesday with the wound care center and [facility] where his dermatologist referred him. Obviously I do think a second opinion would be helpful at this point especially in light of the fact that the patient seems to be doing so poorly despite the fact that  we have tried everything that I really know how at this point. The only thing that ever seems to have helped him in the past is when he was on high doses of continual steroids that did seem to make a difference for him. 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 06/07/2019 upon evaluation today patient unfortunately appears to be doing worse yet again with regard to his wound. In fact I am  starting to question whether or not he may have a fluid pocket in the muscle at this point based on the bulging and the soft appearance to the central portion of the muscle area. There is not anything draining from the muscle itself at this time which is good news but nonetheless the wound is expanding. I am not really seeing any results of the Humira as far as overall wound progression based on what I am seeing at this point. The patient has been referred for second opinion with regard to his wound to the [name] wound care center by his dermatologist which I definitely am not in opposition to. Unfortunately we tried multiple dressings in the past including collagen, alginate, and at one point even Hydrofera Blue. With that being said he is never really used it for any significant amount of time due to the fact that he often complains of pain associated with these dressings and then will go back to either using the Santyl which she has done intermittently or more frequently the triamcinolone. He is also using his own compression stockings. We have wrapped him in the past but again that was something else that he really was not a big fan of. 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 He seemed to do much better when he was on the steroids although honestly I understand that the steroids are not necessarily the best medication to be on long-term obviously 06/14/2019 on evaluation today patient appears to be doing actually a little bit better with regard to the overall appearance with his leg. Unfortunately he does continue to have issues with what appears to be some fluid underneath the  muscle although he did see the wound specialty center at [facility] last week their main goals were to see about infusion therapy in place of the Humira as they feel like that is not quite strong enough. They also recommended that we continue with the treatment otherwise as we are they felt like that was appropriate and they are okay with him continuing to follow- up here with [place] in that regard. With that being said they are also sending him to the vein specialist there to see about vein stripping and if that would be of benefit for him. Subsequently they also did not really address whether or not an ultrasound of the muscle area to see if there is anything that needs to be addressed here would be appropriate or not. For that reason I discussed this with him last week I think we  may proceed down that road at this point. 06/21/2019 upon evaluation today patient's wound actually appears to be doing slightly better compared to previous evaluations. I do believe that he has made a difference with regard to the progression here with the use of oral steroids. Again in the past has been the only thing that is really calm things down. He does tell me that from [facility] is gotten a good news from there that there are no further vein stripping that is necessary at this point. I do not have that available for review today although the patient did relay this to me. He also did obtain and have the ultrasound of the wound completed which I did sign off on today. It does appear that there is no fluid collection under the muscle this is likely then just edematous tissue in general. That is also good news. Overall I still believe the inflammation is the main issue here. He did inquire about the possibility of a wound VAC again with the muscle protruding like it is I am not really sure whether the wound VAC is necessarily ideal or not. That is something we will have to consider although I do believe he may need compression wrapping to  try to help with edema control which could potentially be of benefit. 06/28/2019 on evaluation today patient appears to be doing slightly better measurement wise although this is not terribly smaller he least seems to be trending towards that direction. With that being said he still seems to have purulent drainage noted in the wound bed at this time. He has been on Levaquin followed by Cipro over the past month. Unfortunately he still seems to have some issues with active infection at this time. I did perform a culture last week in order to evaluate and see if indeed there was still anything going on. Subsequently the culture did come back showing Pseudomonas which is consistent with the drainage has been having which is blue-green in color. He also has had an odor that again was somewhat consistent with Pseudomonas as well. Long story short it appears that the culture showed an intermediate finding with regard to how well the Cipro will work for the Pseudomonas infection. Subsequently being that he does not seem to be clearing up and at best what we are doing is just keeping this at [place] I think he may need to see infectious disease to discuss IV antibiotic options. 07/05/2019 upon evaluation today patient appears to be doing okay in regard to his leg ulcer. He has been tolerating the dressing changes at this point without complication. Fortunately there is no signs of active infection at this time which is good news. No fevers, chills, nausea, vomiting, or diarrhea. With that being said he does have an appointment with infectious disease tomorrow and his primary care on Wednesday. Again the reason for the infectious disease referral was due to the fact that he did not seem to be fully resolving with the use of oral antibiotics and therefore we were thinking that IV antibiotic therapy may be necessary secondary to the fact that there was an intermediate finding for how effective the Cipro may be.  Nonetheless again he has been having a lot of purulent and even green drainage. Fortunately right now that seems to have calmed down over the past week with the reinitiation of the oral antibiotic. Nonetheless we will see what Dr. Megan Salon has to say. 07/12/2019 upon evaluation today patient appears to be doing about the  same at this point in regard to his left lower extremity ulcer. Fortunately there is no signs of active infection at this time which is good news I do believe the Levaquin has been beneficial I did review Dr. Hale Bogus note and to be honest I agree that the patient's leg does appear to be doing better currently. What we found in the past as he does not seem to really completely resolve he will stop the antibiotic and then subsequently things will revert back to having issues with blue-green drainage, increased pain, and overall worsening in general. Obviously that is the reason I sent him back to infectious disease. 07/19/2019 upon evaluation today patient appears to be doing roughly the same in size there is really no dramatic improvement. He has started back on the Levaquin at this point and though he seems to be doing okay he did still have a lot of blue/green drainage noted on evaluation today unfortunately. I think that this is still indicative more likely of a Pseudomonas infection as previously noted and again he does see Dr. Megan Salon in just a couple of days. I do not know that were really able to effectively clear this with just oral antibiotics alone based on what I am seeing currently. Nonetheless we are still continue to try to manage as best we can with regard to the patient and his wound. I do think the wrap was helpful in decreasing the edema which is excellent news. No fevers, chills, nausea, vomiting, or diarrhea. 07/26/2019 upon evaluation today patient appears to be doing slightly better with regard to the overall appearance of the muscle there is no dark discoloration  centrally. Fortunately there is no signs of active infection at this time. No fevers, chills, nausea, vomiting, or diarrhea. Patient's wound bed currently the patient did have an appointment with Dr. Megan Salon at infectious disease last week. With that being said Dr. Megan Salon the patient states was still somewhat hesitant about put him on any IV antibiotics he wanted [place] to repeat cultures today and then see where things go going forward. He does look like Dr. Megan Salon because of some improvement the patient did have with the Levaquin wanted [place] to see about repeating cultures. If it indeed grows the Pseudomonas again then he recommended a possibility of considering a PICC line placement and IV antibiotic therapy. He plans to see the patient back in 1 to 2 weeks. 08/02/2019 upon evaluation today patient appears to be doing poorly with regard to his left lower extremity. We did get the results of his culture back it shows that he is still showing evidence of Pseudomonas which is consistent with the purulent/blue-green drainage that he has currently. Subsequently the culture also shows that he now is showing resistance to the oral fluoroquinolones which is unfortunate as that was really the only thing to treat the infection prior. I do believe that he is looking like this is going require IV antibiotic therapy to get this under control. Fortunately there is no signs of systemic infection at this time which is good news. The patient does see Dr. Megan Salon tomorrow. 08/09/2019 upon evaluation today patient appears to be doing better with regard to his left lower extremity ulcer in regard to the overall appearance. He is currently on IV antibiotic therapy. As ordered by Dr. Megan Salon. Currently the patient is on ceftazidime which she is going to take for the next 2 weeks and then follow-up for 4 to 5-week appointment with Dr. Megan Salon. The patient started  this this past Friday symptoms have not for a total of 3  days currently in full. 08/16/2019 upon evaluation today patient's wound actually does show muscle in the base of the wound but in general does appear to be much better as far as the overall evidence of infection is concerned. In fact I feel like this is for the most part cleared up he still on the IV antibiotics he has not completed the full course yet but I think he is doing much better which is excellent news. 08/23/2019 upon evaluation today patient appears to be doing about the same with regard to his wound at this point. He tells me that he still has pain unfortunately. Fortunately there is no evidence of systemic infection at this time which is great news. There is significant muscle protrusion. 09/13/19 upon evaluation today patient appears to be doing about the same in regard to his leg unfortunately. He still has a lot of drainage coming from the ulceration there is still muscle exposed. With that being said the patient's last wound culture still showed an intermediate finding with regard to the Pseudomonas he still having the bluish/green drainage as well. Overall I do not know that the wound has completely cleared of infection at this point. Fortunately there is no signs of active infection systemically at this point which is good news. Electronic Signature(s) Signed: 09/13/2019 10:37:53 AM By: Hedy Camara, Cedrick ([id number]) Entered By: Worthy Keeler on 09/13/2019 10:37:53 [name], [name] ([id number]) -------------------------------------------------------------------------------- Physical Exam Details Patient Name: [name] Date of Service: 09/13/2019 8:15 AM Medical Record [id number] Patient Account Number: [id number] Date of Birth/Sex: [birth year] (40 y.o. M) Treating RN: Army Melia Primary Care Provider: [name] Other Clinician: Referring Provider: [name] Treating Provider/Extender:STONE III, Bowden [name] Weeks in Treatment:  144 Constitutional Obese and well-hydrated in no acute distress. Respiratory normal breathing without difficulty. Psychiatric this patient is able to make decisions and demonstrates good insight into disease process. Alert and Oriented x 3. pleasant and cooperative. Notes Upon inspection patient's wound bed actually showed signs of good granulation in some of the surrounding areas although he did still have a significant area of muscle exposure. We do need to try to get tissue growth over top of this and I think the infection continuing has somewhat limited the ability in that regard. With that being said the patient still tells me that his pain is less than it was previous which is good news. Electronic Signature(s) Signed: 09/13/2019 10:38:10 AM By: Worthy Keeler PA-C Entered By: Worthy Keeler on 09/13/2019 10:38:10 [name] ([id number]) -------------------------------------------------------------------------------- Physician Orders Details Patient Name: [name] Date of Service: 09/13/2019 8:15 AM Medical Record [id number] Patient Account Number: [id number] Date of Birth/Sex: 1980-08-21 (39 y.o. 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 Anesthetic (add to Medication List) Wound #1 Left,Lateral Lower Leg o Topical Lidocaine 4% cream applied to wound bed prior to debridement (In Clinic Only). Primary Wound Dressing o Gentamicin Sulfate Cream Secondary Dressing Wound #1  Left,Lateral Lower Leg o Boardered Foam Dressing Dressing Change Frequency Wound #1 Left,Lateral Lower Leg o Change  dressing every day. Follow-up Appointments Wound #1 Left,Lateral Lower Leg o Return Appointment in 1 week. Edema Control Wound #1 Left,Lateral Lower Leg o Elevate legs to the level of the heart and pump ankles as often as possible Patient Medications Allergies: milk, Biaxin, seasonal Notifications Medication Indication Start End gentamicin 09/13/2019 DOSE topical 0.1 % cream - cream topical applied in a thin film daily to the wound bed as directed in the clinic. Electronic Signature(s) Signed: 09/13/2019 10:40:15 AM By: Worthy Keeler PA-C Entered By: Worthy Keeler on 09/13/2019 10:40:12 [name], [name] ([id number]) -------------------------------------------------------------------------------- Problem List Details Patient Name: [name] Date of Service: 09/13/2019 8:15 AM Medical Record [id number] Patient Account Number: [id number] Date of Birth/Sex: 1981/05/15 (39 y.o. M) Treating RN: Army Melia Primary Care Provider: [name] Other Clinician: Referring Provider: [name] Treating Provider/Extender:STONE III, Jubilee Vivero Weeks in Treatment: 144 Active Problems ICD-10 Encounter Code Description Active Date MDM Diagnosis L97.222 Non-pressure chronic ulcer of left calf with fat layer exposed 12/04/2016 No Yes L88 Pyoderma gangrenosum 03/26/2017 No Yes I87.2 Venous insufficiency (chronic) (peripheral) 12/04/2016 No Yes L03.116 Cellulitis of left lower limb 05/24/2019 No Yes Inactive Problems ICD-10 Code Description Active Date Inactive Date L97.213 Non-pressure chronic ulcer of right calf with necrosis of muscle 04/02/2017 04/02/2017 Resolved Problems Electronic Signature(s) Signed: 09/13/2019 8:46:29 AM By: Worthy Keeler PA-C Entered By: Worthy Keeler on 09/13/2019 08:46:29 [name], [name] ([id number]) [name], [name]  ([id number]) -------------------------------------------------------------------------------- Progress Note Details Patient Name: [name] Date of Service: 09/13/2019 8:15 AM Medical Record [id number] Patient Account Number: [id number] Date of Birth/Sex: [birth year] (39 y.o. M) Treating RN: Army Melia Primary Care Provider: [name] Other Clinician: Referring Provider: [name] Treating Provider/Extender:STONE III, Abhijot Straughter Weeks in Treatment: 144 Subjective Chief Complaint Information obtained from Patient He is here in follow up evaluation for LLE pyoderma ulcer History of Present Illness (HPI) 12/04/16; 40 year old man who comes into the clinic today for review of a wound on the posterior left calf. He tells me that is been there for about a year. He is not a diabetic he does smoke half a pack per day. He was seen in the ER on 11/20/16 felt to have cellulitis around the wound and was given clindamycin. An x-ray did not show osteomyelitis. The patient initially tells me that he has a milk allergy that sets off a pruritic itching rash on his lower legs which she scratches incessantly and he thinks that's what may have set up the wound. He has been using various topical antibiotics and ointments without any effect. He works in a [employer] and is on his feet all day. He does not have a prior history of wounds however he does have the rash on both lower legs the right arm and the ventral aspect of his left arm. These are excoriations and clearly have had scratching however there are of macular looking areas on both legs including a substantial larger area on the right leg. This does not have an underlying open area. There is no blistering. The patient tells me that 2 years ago in [place] in response to the rash on his legs he saw a dermatologist who told him he had a condition which may be pyoderma gangrenosum although I may be putting words into his mouth. He seemed to  recognize this. 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 He is not in any treatment for this. He's had no recent travel 12/11/16; the patient arrives today with  his wound and roughly the same condition we've been using silver alginate this is a deep punched out wound with some surrounding erythema but no tenderness. Biopsy I did did not show confirmed pyoderma gangrenosum suggested nonspecific inflammation and vasculitis but does not provide an actual description of what was seen by the pathologist. I'm really not able to understand this We have also received information from the patient's dermatologist in [place] notes from April 2016. This was a doctor Agarwal-antal. 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 He was prescribed topical steroid high potency under occlusion which helped but at this point the patient did not have a deep punched out wound. 12/18/16; the patient's wound is larger in terms of surface area however this surface looks better and there is less depth. The surrounding erythema also is better. The patient states that the wrap we put on came off 2 days ago when he has been using his compression stockings. He we are in the process of getting a dermatology consult. 12/26/16 on evaluation today patient's left lower extremity wound shows evidence of infection with surrounding erythema noted. He has been tolerating the dressing changes but states that he has noted more discomfort. There is a larger area of erythema surrounding the wound. No fevers, chills, nausea, or vomiting noted at this time. With that being said the wound still does have slough covering the surface. He is not allergic to any medication that he is aware of at this point. In regard to his right lower extremity he had several regions that are erythematous and pruritic he wonders if there's anything we can do to help that. 01/02/17 I reviewed patient's wound culture  which was obtained his visit last week. He was placed on doxycycline at that point. Unfortunately that does not appear to be an antibiotic that would likely help with the situation however the pseudomonas noted on culture is sensitive to Cipro. Also unfortunately patient's wound seems to have a large compared to last week's evaluation. Not severely so but there are definitely increased measurements in general. He is continuing to have discomfort as well he writes this to be a seven out of 10. In fact he would prefer me not to perform any debridement today due to the fact that he is having discomfort and considering he has an active infection on the little reluctant to do so anyway. No fevers, chills, nausea, or vomiting noted at this time. 01/08/17; patient seems dermatology on September 5. I suspect dermatology will want the slides from the biopsy I did sent to their pathologist. I'm not sure if there is a way we can expedite that. In any case the culture I did before I left on vacation 3 weeks ago showed Pseudomonas he was given 10 days of Cipro and per her description of her intake nurses is actually somewhat better this week although the wound is quite a bit bigger than I remember the last time I saw this. He still has 3 more days of Cipro 01/21/17; dermatology appointment tomorrow. He has completed the ciprofloxacin for Pseudomonas. Surface of the wound looks better however he is had some deterioration in the lesions on his right leg. Meantime the left lateral leg wound we will continue with sample 01/29/17; patient had his dermatology appointment but I can't yet see that note. He is completed his antibiotics. The wound is more superficial but considerably larger in circumferential area than when he came in. This is in his left lateral calf. He  also has swollen erythematous areas with superficial wounds on the right leg and small papular areas on both arms. There apparently areas in her his upper  thighs and buttocks I did not look at those. Dermatology biopsied the right leg. Hopefully will have their input next week. 02/05/17; patient went back to see his dermatologist who told him that he had a "scratching problem" as well as staph. He is now on a 30 day course of doxycycline and I believe she gave him triamcinolone cream to the right leg areas to help with the itching [not exactly sure but probably triamcinolone]. She apparently looked at the left lateral leg wound although this was not rebiopsied and I think felt to be ultimately part of the same pathogenesis. He is using sample border foam and changing nevus himself. He now has a new open area on the right posterior leg which was his biopsy site I don't have any of the dermatology notes 02/12/17; we put the patient in compression last week with SANTYL to the wound on the left leg and the biopsy. Edema is much better and the depth of the wound is now at level of skin. Area is still the same Biopsy site on the right lateral leg we've also been using santyl with a border foam dressing and he is changing this himself. 02/19/17; Using silver alginate started last week to both the substantial left leg wound and the biopsy site on the right wound. He is tolerating compression well. Has a an appointment with his primary M.D. tomorrow wondering about diuretics although I'm wondering if the edema problem is actually lymphedema 02/26/17; the patient has been to see his primary doctor Dr. Jerrel Ivory at [facility] our primary care. She started him on Lasix 20 mg and this seems to have helped with the edema. However we are not making substantial change with the left lateral calf wound and inflammation. The biopsy site on the right leg also looks stable but not really all that different. 03/12/17; the patient has been to see vein and vascular Dr. Lucky Cowboy. He has had venous reflux studies I have not reviewed these. I did get a call from his dermatology  office. 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 They therefore recommended clobetasol and tetracycline i.e. 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 03/26/17; apparently the patient just had reflux studies not an appointment with Dr. dew. She arrives in clinic today having applied clobetasol for 2-3 weeks. He notes over the last 2-3 days excessive drainage having to change the dressing 3-4 times a day and also expanding erythema. He states the expanding erythema seems to come and go and was last this red was earlier in the month.he is on doxycycline 150 mg twice a day as an anti- inflammatory systemic therapy for possible pyoderma gangrenosum along with the topical clobetasol 04/02/17; the patient was seen last week by Dr. Lillia Carmel at [facility] dermatology locally who kindly saw him at my request. 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 My concern was the degree of erythema medially extending from his left leg wound which was either inflammation from pyoderma or cellulitis. I put him on Augmentin however  culture of the wound showed Pseudomonas which is quinolone sensitive. I really don't believe he has cellulitis however in view of everything I will continue and give him a course of Cipro. He is also on doxycycline as an immune modulator for the pyoderma. In addition to his original wound on the left lateral leg with surrounding erythema he has a wound on the right posterior calf which was an original biopsy site done by dermatology. 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 Saw Dr. Lillia Carmel yesterday. He  has been using topical antibiotics to both wound areas his original wound on the left and the biopsies/pathergy area on the right. There is definitely some improvement in the inflammation around the wound on the right although the patient states he has increasing sensitivity of the wounds. He is on prednisone 60 and doxycycline 1 as prescribed by Dr. Lillia Carmel. He is covering the topical antibiotic with gauze and putting this in his own compression stocks and changing this daily. He states that Dr. Lottie Rater did a culture of the left leg wound yesterday 05/07/17; pyoderma gangrenosum. The patient saw Dr. Lillia Carmel yesterday and has a follow-up with her in one month. He is still using topical antibiotics to both wounds although he can't recall exactly what type. He is still on prednisone 60 mg. Dr. Lillia Carmel stated that the doxycycline could stop if we were in agreement. 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 The right medial leg was induced by biopsy/pathergy. The area [name], [name] ([id number]) on the right is essentially healed. 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 The right medial leg has closed and remains closed. He is still on prednisone 60. He tells me he missed his last dermatology appointment with Dr. Lillia Carmel but will make another appointment. He reports that her blood sugar at a recent screen in [place] was high 200's. He was 180 today. 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 I think he is going to require still much longer prednisone perhaps another 3 months before attempting to taper. In the meantime his wound is a lot better. Smaller. He is cleaning this off daily and applying topical antibiotics. When he was last in the clinic I thought about changing to [facility] and  actually put in a couple of calls to dermatology although probably not during their business hours. 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 He continues on oral prednisone. He has been using triple antibiotic ointment. There is surface debris and we will transition to [facility] and have him return in 2 weeks. He has lost 30 pounds since his last appointment with lifestyle modification. He may benefit from topical steroid cream for treatment this can be considered at a later date. 08/22/17 on evaluation today patient appears to actually be doing rather well in regard to his left lateral lower extremity ulcer. He has actually been managed by Dr. Dellia Nims most recently. Patient is currently on oral steroids at this time. This seems to have been of benefit for him. Nonetheless his last visit was actually with Leah on 08/06/17. Currently he is not utilizing any topical steroid creams although this could be of  benefit as well. No fevers, chills, nausea, or vomiting noted at this time. 09/05/17 on evaluation today patient appears to be doing better in regard to his left lateral lower extremity ulcer. He has been tolerating the dressing changes without complication. He is using Santyl with good effect. Overall I'm very pleased with how things are standing at this point. Patient likewise is happy that this is doing better. 09/19/17 on evaluation today patient actually appears to be doing rather well in regard to his left lateral lower extremity ulcer. 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 He's not having any significant pain. 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 He has been tolerating the Santyl  without complication and in general I feel like he's making good progress. 10/17/17 on evaluation today patient appears to be doing very well in regard to his left lateral lower surety ulcer. He has been tolerating the dressing changes without complication. There does not appear to be any evidence of infection he's alternating the Santyl and the triple antibiotic ointment every other day this seems to be doing well for him. 11/03/17 on evaluation today patient appears to be doing very well in regard to his left lateral lower extremity ulcer. He is been tolerating the dressing changes without complication which is good news. Fortunately there does not appear to be any evidence of infection which is also great news. Overall is doing excellent they are starting to taper down on the prednisone is down to 40 mg at this point it also started topical clobetasol for him. 11/17/17 on evaluation today patient appears to be doing well in regard to his left lateral lower surety ulcer. He's been tolerating the dressing changes without complication. He does note that he is having no pain, no excessive drainage or discharge, and overall he feels like things are going about how he would expect and hope they would. Overall he seems to have no evidence of infection at this time in my opinion which is good news. 12/04/17-He is seen in follow-up evaluation for right lateral lower extremity ulcer. He has been applying topical steroid cream. Today's measurement show slight increase in size. Over the next 2 weeks we will transition to every other day Santyl and steroid cream. He has been encouraged to monitor for changes and notify clinic with any concerns 12/15/17 on evaluation today patient's left lateral motion the ulcer and fortunately is doing worse again at this point. This just since last week to this week has close to doubled in size according to the patient. I did not seeing last week's I do not have a visual to compare  this to in our system was also down so we do not have all the charts and at this point. Nonetheless it does have me somewhat concerned in regard to the fact that again he was worried enough about it he has contact the dermatology that placed them back on the full strength, 50 mg a day of the prednisone that he was taken previous. He continues to alternate using clobetasol along with Santyl at this point. He is obviously somewhat frustrated. 12/22/17 on evaluation today patient appears to be doing a little worse compared to last evaluation. Unfortunately the wound is a little deeper and slightly larger than the last week's evaluation. With that being said he has  made some progress in regard to the irritation surrounding at this time unfortunately despite that progress that's been made he still has a significant issue going on here. 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 For that reason I discussed with him today the possibility of perform a wound culture to ensure there's nothing overtly infected. 01/06/18 on evaluation today patient's wound is larger and deeper than previously evaluated. With that being said it did appear that his wound was infected after my last evaluation with him. Subsequently I did end up prescribing a prescription for Bactrim DS which she has been taking and having no complication with. Fortunately there does not appear to be any evidence of infection at this point in time as far as anything spreading, no want to touch, and overall I feel like things are showing signs of improvement. 01/13/18 on evaluation today patient appears to be even a little larger and deeper than last time. There still muscle exposed in the base of the wound. Nonetheless he does appear to be less erythematous I do believe inflammation is calming down also  believe the infection looks like it's probably resolved at this time based on what I'm seeing. No fevers, chills, nausea, or vomiting noted at this time. 01/30/18 on evaluation today patient actually appears to visually look better for the most part. Unfortunately those visually this looks better he does seem to potentially have what may be an abscess in the muscle that has been noted in the central portion of the wound. This is the first time that I have noted what appears to be fluctuance in the central portion of the muscle. With that being said I'm somewhat more concerned about the fact that this might indicate an abscess formation at this location. I do believe that an ultrasound would be appropriate. This is likely something we need to try to do as soon as possible. He has been switch to mupirocin ointment and he is no longer using the steroid ointment as prescribed by dermatology he sees them again next week he's been decreased from 60 to 40 mg of prednisone. 03/09/18 on evaluation today patient actually appears to be doing a little better compared to last time I saw him. There's not as much erythema surrounding the wound itself. He I did review his most recent infectious disease note which was dated 02/24/18. He saw Dr. Michel Bickers in [place]. 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 Patient is now off of antibiotics and they are continually observing this. There seems to be no change in the past two weeks in my pinion based on what the patient says and what I see today compared to what Dr. Megan Salon likely saw two weeks ago. No fevers, chills, nausea, or vomiting noted at this time. 03/23/18 on evaluation today patient's wound actually appears to be showing signs of improvement which is good news. He is currently still on the Dapsone. He is also working on tapering the prednisone to get off of this and  Dr. Lottie Rater is working with him in this regard. Nonetheless overall I feel like the wound is doing well it does appear based on the infectious disease note that I reviewed from Dr. 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 I did also review the results of his ultrasound of left lower extremity which revealed there was a dentist tissue in the base of the wound without an abscess noted. 04/06/18 on evaluation today the patient's left lateral lower extremity ulcer actually appears to be doing fairly well which is excellent news. There does not appear to be any evidence of infection at this time which is also great news. Overall he still does have a significantly large ulceration although little by little he seems to be making progress. He is down to 10 mg a day of the prednisone. 04/20/18 on evaluation today patient actually appears to be doing excellent at this time in regard to his left lower extremity ulcer. He's making signs of good progress unfortunately this is taking much longer than we would really like to see but nonetheless he is making progress. Fortunately there does not appear to be any evidence of infection at this time. No fevers, chills, nausea, or vomiting noted at this time. The patient has not been using the Santyl due to the cost he hadn't got in this field yet. He's mainly been using the antibiotic ointment topically. Subsequently he also tells me that he really has not been scrubbing in the shower I think this would be helpful again as I told him it doesn't have to be anything too aggressive to even make it believe just enough to keep it free of some of the loose slough and biofilm on the wound surface. 05/11/18 on evaluation today patient's wound appears to be making slow but sure progress in regard to the left lateral lower extremity ulcer. He is been tolerating the  dressing changes without complication. Fortunately there does not appear to be any evidence of infection at this time. He is still just using triple antibiotic ointment along with clobetasol occasionally over the area. He never got the Santyl and really does not seem to intend to in my pinion. 06/01/18 on evaluation today patient appears to be doing a little better in regard to his left lateral lower extremity ulcer. He states that overall he does not feel like he is doing as well with the Dapsone as he did with the prednisone. Nonetheless he sees his dermatologist later today and is gonna talk to them about the possibility of going back on the prednisone. Overall again I believe that the wound would be better if you would utilize Santyl but he really does not seem to be interested in going back to the [place] at this point. He has been using triple antibiotic ointment. 06/15/18 on evaluation today patient's wound actually appears to be doing about the same at this point. Fortunately there is no signs of infection at this time. He has made slight improvements although he continues to not really want to clean the wound bed at this point. He states that he just doesn't mess with it he doesn't want to cause any problems with everything else he has going on. He has been on medication, antibiotics as prescribed by his dermatologist, for a staff infection of his lower extremities which is really drying out now and looking much better he tells me. Fortunately there is no sign of overall infection. 06/29/18 on evaluation today patient appears to be doing well in regard to his left lateral lower surety ulcer all things considering. Fortunately his staff infection seems to be greatly improved compared to previous.  He has no signs of infection and this is drying up quite nicely. He is still the doxycycline for this is no longer on cental, Dapsone, or any of the other medications. His dermatologist has recommended  possibility of an infusion but right now he does not want to proceed with that. 07/13/18 on evaluation today patient appears to be doing about the same in regard to his left lateral lower surety ulcer. Fortunately there's no signs of infection at this time which is great news. Unfortunately he still builds up a significant amount of Slough/biofilm of the surface of the wound he still is not [name], [name] ([id number]) really cleaning this as he should be appropriately. Again I'm able to easily with saline and gauze remove the majority of this on the surface which if you would do this at home would likely be a dramatic improvement for him as far as getting the area to improve. Nonetheless overall I still feel like he is making progress is just very slow. I think Santyl will be of benefit for him as well. Still he has not gotten this as of this point. 07/27/18 on evaluation today patient actually appears to be doing little worse in regards of the erythema around the periwound region of the wound he also tells me that he's been having more drainage currently compared to what he was experiencing last time I saw him. He states not quite as bad as what he had because this was infected previously but nonetheless is still appears to be doing poorly. Fortunately there is no evidence of systemic infection at this point. The patient tells me that he is not going to be able to afford the Santyl. He is still waiting to hear about the infusion therapy with his dermatologist. Apparently she wants an updated colonoscopy first. 08/10/18 on evaluation today patient appears to be doing better in regard to his left lateral lower extremity ulcer. Fortunately he is showing signs of improvement in this regard he's actually been approved for Remicade infusion's as well although this has not been scheduled as of yet. Fortunately there's no signs of active infection at this time in regard to the wound although he is having some  issues with infection of the right lower extremity is been seen as dermatologist for this. Fortunately they are definitely still working with him trying to keep things under control. 09/07/18 on evaluation today patient is actually doing rather well in regard to his left lateral lower extremity ulcer. He notes these actually having some hair grow back on his extremity which is something he has not seen in years. He also tells me that the pain is really not giving them any trouble at this time which is also good news overall she is very pleased with the progress he's using a combination of the mupirocin along with the probate is all mixed. 09/21/18 on evaluation today patient actually appears to be doing fairly well all things considered in regard to his looks from the ulcer. He's been tolerating the dressing changes without complication. Fortunately there's no signs of active infection at this time which is good news he is still on all antibiotics or prevention of the staff infection. He has been on prednisone for time although he states it is gonna contact his dermatologist and see if she put them on a short course due to some irritation that he has going on currently. Fortunately there's no evidence of any overall worsening this is going very slow I think cental  would be something that would be helpful for him although he states that $50 for tube is quite expensive. He therefore is not willing to get that at this point. 10/06/18 on evaluation today patient actually appears to be doing decently well in regard to his left lateral leg ulcer. He's been tolerating the dressing changes without complication. Fortunately there's no signs of active infection at this time. Overall I'm actually rather pleased with the progress he's making although it's slow he doesn't show any signs of infection and he does seem to be making some improvement. I do believe that he may need a switch up and dressings to try to help  this to heal more appropriately and quickly. 10/19/18 on evaluation today patient actually appears to be doing better in regard to his left lateral lower extremity ulcer. This is shown signs of having much less Slough buildup at this point due to the fact he has been using the [employer]. Obviously this is very good news. The overall size of the wound is not dramatically smaller but again the appearance is. 11/02/18 on evaluation today patient actually appears to be doing quite well in regard to his lower [place] ulcer. A lot of the skin around the ulcer is actually somewhat irritating at this point this seems to be more due to the dressing causing irritation from the adhesive that anything else. Fortunately there is no signs of active infection at this time. 11/24/18 on evaluation today patient appears to be doing a little worse in regard to his overall appearance of his lower extremity ulcer. There's more erythema and warmth around the wound unfortunately. He is currently on doxycycline which he has been on for some time. With that being said I'm not sure that seems to be helping with what appears to possibly be an acute cellulitis with regard to his left lower extremity ulcer. No fevers, chills, nausea, or vomiting noted at this time. 12/08/18 on evaluation today patient's wounds actually appears to be doing significantly better compared to his last evaluation. He has been using Santyl along with alternating tripling about appointment as well as the steroid cream seems to be doing quite well and the wound is showing signs of improvement which is excellent news. Fortunately there's no evidence of infection and in fact his culture came back negative with only normal skin flora noted. 12/21/2018 upon evaluation today patient actually appears to be doing excellent with regard to his ulcer. This is actually the best that I have seen it since have been helping to take care of him. It is both smaller as well as less  slough noted on the surface of the wound and seems to be showing signs of good improvement with new skin growing from the edges. He has been using just the triamcinolone he does wonder if he can get a refill of that ointment today. 01/04/2019 upon evaluation today patient actually appears to be doing well with regard to his left lateral lower extremity ulcer. With that being said it does not appear to be that he is doing quite as well as last time as far as progression is concerned. There does not appear to be any signs of infection or significant irritation which is good news. With that being said I do believe that he may benefit from switching to a collagen based dressing based on how clean The wound appears. 01/18/2019 on evaluation today patient actually appears to be doing well with regard to his wound on the left lower  extremity. He is not made a lot of progress compared to where we were previous but nonetheless does seem to be doing okay at this time which is good news. There is no signs of active infection which is also good news. My only concern currently is I do wish we can get him into utilizing the collagen dressing his insurance would not pay for the supplies that we ordered although it appears that he may be able to order this through his supply company that he typically utilizes. This is [employer]. Nonetheless he did try to order it during the office visit today and it appears this did go through. We will see if he can get that it is a different brand but nonetheless he has collagen and I do think will be beneficial. 02/01/2019 on evaluation today patient actually appears to be doing a little worse today in regard to the overall size of his wounds. Fortunately there is no signs of active infection at this time. That is visually. Nonetheless when this is happened before it was due to infection. For that reason were somewhat concerned about that this time as well. 02/08/2019 on evaluation  today patient unfortunately appears to be doing slightly worse with regard to his wound upon evaluation today. Is measuring a little deeper and a little larger unfortunately. I am not really sure exactly what is causing this to enlarge he actually did see his dermatologist she is going to see about initiating Humira for him. Subsequently she also did do steroid injections into the wound itself in the periphery. Nonetheless still nonetheless he seems to be getting a little bit larger he is gone back to just using the steroid cream topically which I think is appropriate. I would say hold off on the collagen for the time being is definitely a good thing to do. Based on the culture results which we finally did get the final result back regarding it shows staph as the bacteria noted again that can be a normal skin bacteria based on the fact however he is having increased drainage and worsening of the wound measurement wise I would go ahead and place him on an antibiotic today I do believe for this. 02/15/2019 on evaluation today patient actually appears to be doing somewhat better in regard to his ulcer. There is no signs of worsening at this time I did review his culture results which showed evidence of Staphylococcus aureus but not MRSA. Again this could just be more related to the normal skin bacteria although he states the drainage has slowed down quite a bit he may have had a mild infection not just colonization. And was much smaller and then since around10/04/2019 on evaluation today patient appears to be doing unfortunately worse as far as the size of the wound. I really feel like that this is steadily getting larger again it had been doing excellent right at the beginning of September we have seen a steady increase in the area of the wound it is almost 2-1/2 times the size it was on September 1. Obviously this is a bad trend this is not wanting to see. For that reason we went back to using just the  topical triamcinolone cream which does seem to help with inflammation. I checked him for bacteria by way of culture and nothing showed positive there. I am considering giving him a short course of a tapering steroid Dosepak today to see if that is can be beneficial for him. The patient is in  agreement with giving that a try. 03/08/2019 on evaluation today patient appears to be doing very well in comparison to last evaluation with regard to his lower extremity ulcer. This is showing signs of less inflammation and actually measuring slightly smaller compared to last time every other week over the past month and a half he has been measuring larger larger larger. Nonetheless I do believe that the issue has been inflammation the prednisone does seem to have been beneficial for him which is good news. No fevers, chills, nausea, vomiting, or diarrhea. 03/22/2019 on evaluation today patient appears to be doing about the same with regard to his leg ulcer. He has been tolerating the dressing changes without complication. With that being said the wound seems to be mostly arrested at its current size but really is not making any progress except for when we prescribed the prednisone. He did show some signs of dropping as far as the overall size of the wound during that interval week. Nonetheless this is something he is not on long-term at this point and unfortunately I think he is getting need either this or else the Humira which his dermatologist has discussed try to get approval for. With that being said he will be seeing his dermatologist on the 11th of this month that is November. 04/19/2019 on evaluation today patient appears to be doing really about the same the wound is measuring slightly larger compared to last time I saw him. He has not been into the office since November 2 due to the fact that he unfortunately had Covid as that his entire family. He tells me that it was rough but they did pull-through and  he seems to be doing much better. Fortunately there is no signs of active infection at this time. No fevers, chills, nausea, vomiting, or diarrhea. 05/10/2019 on evaluation today patient unfortunately appears to be doing significantly worse as compared to last time I saw him. He does tell me that he has had his first dose of Humira and actually is scheduled to get the next one in the upcoming week. With that being said he tells me also that in the past several days he has been having a lot of issues with green drainage she showed me a picture this is more blue-green in color. He is also been having issues with increased sloughy buildup and the wound does appear to be larger today. Obviously this is not the direction that we want everything to take based on the starting of his Humira. Nonetheless I think this is definitely a result of likely infection and to be honest I think this is probably Pseudomonas causing the infection based on what I am seeing. 05/24/2019 on evaluation today patient unfortunately appears to be doing significantly worse compared to his prior evaluation with me 2 weeks ago. I did review his culture results which showed that he does have Staph aureus as well as Pseudomonas noted on the culture. Nonetheless the Levaquin that I [name], [name] ([id number]) prescribed for him does not appear to have been appropriate and in fact he tells me he is no longer experiencing the green drainage and discharge that he had at the last visit. Fortunately there is no signs of active infection at this time which is good news although the wound has significantly worsened it in fact is much deeper than it was previous. We have been utilizing up to this point triamcinolone ointment as the prescription topical of choice but at this time I really  feel like that the wound is getting need to be packed in order to appropriately manage this due to the deeper nature of the wound. Therefore something along the  lines of an alginate dressing may be more appropriate. 05/31/2019 upon inspection today patient's wound actually showed signs of doing poorly at this point. Unfortunately he just does not seem to be making any good progress despite what we have tried. He actually did go ahead and pick up the Cipro and start taking that as he was noticing more green drainage he had previously completed the Levaquin that I prescribed for him as well. Nonetheless he missed his appointment for the seventh last week on Wednesday with the wound care center and [facility] where his dermatologist referred him. Obviously I do think a second opinion would be helpful at this point especially in light of the fact that the patient seems to be doing so poorly despite the fact that we have tried everything that I really know how at this point. The only thing that ever seems to have helped him in the past is when he was on high doses of continual steroids that did seem to make a difference for him. 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 06/07/2019 upon evaluation today patient unfortunately appears to be doing worse yet again with regard to his wound. In fact I am starting to question whether or not he may have a fluid pocket in the muscle at this point based on the bulging and the soft appearance to the central portion of the muscle area. There is not anything draining from the muscle itself at this time which is good news but nonetheless the wound is expanding. I am not really seeing any results of the Humira as far as overall wound progression based on what I am seeing at this point. The patient has been referred for second opinion with regard to his wound to the [facility] wound care center by his dermatologist which I definitely am not in opposition to. Unfortunately we  tried multiple dressings in the past including collagen, alginate, and at one point even Hydrofera Blue. With that being said he is never really used it for any significant amount of time due to the fact that he often complains of pain associated with these dressings and then will go back to either using the Santyl which she has done intermittently or more frequently the triamcinolone. He is also using his own compression stockings. We have wrapped him in the past but again that was something else that he really was not a big fan of. 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 He seemed to do much better when he was on the steroids although honestly I understand that the steroids are not necessarily the best medication to be on long-term obviously 06/14/2019 on evaluation today patient appears to be doing actually a little bit better with regard to the overall appearance with his leg.  Unfortunately he does continue to have issues with what appears to be some fluid underneath the muscle although he did see the wound specialty center at [facility] last week their main goals were to see about infusion therapy in place of the Humira as they feel like that is not quite strong enough. They also recommended that we continue with the treatment otherwise as we are they felt like that was appropriate and they are okay with him continuing to follow- up here with [place] in that regard. With that being said they are also sending him to the vein specialist there to see about vein stripping and if that would be of benefit for him. Subsequently they also did not really address whether or not an ultrasound of  the muscle area to see if there is anything that needs to be addressed here would be appropriate or not. For that reason I discussed this with him last week I think we may proceed down that road at this point. 06/21/2019 upon evaluation today patient's wound actually appears to be doing slightly better compared to previous evaluations. I do believe that he has made a difference with regard to the progression here with the use of oral steroids. Again in the past has been the only thing that is really calm things down. He does tell me that from [facility] is gotten a good news from there that there are no further vein stripping that is necessary at this point. I do not have that available for review today although the patient did relay this to me. He also did obtain and have the ultrasound of the wound completed which I did sign off on today. It does appear that there is no fluid collection under the muscle this is likely then just edematous tissue in general. That is also good news. Overall I still believe the inflammation is the main issue here. He did inquire about the possibility of a wound VAC again with the muscle protruding like it is I am not really sure whether the wound VAC is necessarily ideal or not. That is something we will have to consider although I do believe he may need compression wrapping to try to help with edema control which could potentially be of benefit. 06/28/2019 on evaluation today patient appears to be doing slightly better measurement wise although this is not terribly smaller he least seems to be trending towards that direction. With that being said he still seems to have purulent drainage noted in the wound bed at this time. He has been on Levaquin followed by Cipro over the past month. Unfortunately he still seems to have some issues with active infection at this time. I did perform a culture last week in order to evaluate and see if indeed there was still anything going on.  Subsequently the culture did come back showing Pseudomonas which is consistent with the drainage has been having which is blue-green in color. He also has had an odor that again was somewhat consistent with Pseudomonas as well. Long story short it appears that the culture showed an intermediate finding with regard to how well the Cipro will work for the Pseudomonas infection. Subsequently being that he does not seem to be clearing up and at best what we are doing is just keeping this at [place] I think he may need to see infectious disease to discuss IV antibiotic options. 07/05/2019 upon evaluation today patient appears to be doing okay in regard to his leg ulcer. He has been  tolerating the dressing changes at this point without complication. Fortunately there is no signs of active infection at this time which is good news. No fevers, chills, nausea, vomiting, or diarrhea. With that being said he does have an appointment with infectious disease tomorrow and his primary care on Wednesday. Again the reason for the infectious disease referral was due to the fact that he did not seem to be fully resolving with the use of oral antibiotics and therefore we were thinking that IV antibiotic therapy may be necessary secondary to the fact that there was an intermediate finding for how effective the Cipro may be. Nonetheless again he has been having a lot of purulent and even green drainage. Fortunately right now that seems to have calmed down over the past week with the reinitiation of the oral antibiotic. Nonetheless we will see what Dr. Megan Salon has to say. 07/12/2019 upon evaluation today patient appears to be doing about the same at this point in regard to his left lower extremity ulcer. Fortunately there is no signs of active infection at this time which is good news I do believe the Levaquin has been beneficial I did review Dr. Hale Bogus note and to be honest I agree that the patient's leg does appear to be  doing better currently. What we found in the past as he does not seem to really completely resolve he will stop the antibiotic and then subsequently things will revert back to having issues with blue-green drainage, increased pain, and overall worsening in general. Obviously that is the reason I sent him back to infectious disease. 07/19/2019 upon evaluation today patient appears to be doing roughly the same in size there is really no dramatic improvement. He has started back on the Levaquin at this point and though he seems to be doing okay he did still have a lot of blue/green drainage noted on evaluation today unfortunately. I think that this is still indicative more likely of a Pseudomonas infection as previously noted and again he does see Dr. Megan Salon in just a couple of days. I do not know that were really able to effectively clear this with just oral antibiotics alone based on what I am seeing currently. Nonetheless we are still continue to try to manage as best we can with regard to the patient and his wound. I do think the wrap was helpful in decreasing the edema which is excellent news. No fevers, chills, nausea, vomiting, or diarrhea. 07/26/2019 upon evaluation today patient appears to be doing slightly better with regard to the overall appearance of the muscle there is no dark discoloration centrally. Fortunately there is no signs of active infection at this time. No fevers, chills, nausea, vomiting, or diarrhea. Patient's wound bed currently the patient did have an appointment with Dr. Megan Salon at infectious disease last week. With that being said Dr. Megan Salon the patient states was still somewhat hesitant about put him on any IV antibiotics he wanted [place] to repeat cultures today and then see where things go going forward. He does look like Dr. Megan Salon because of some improvement the patient did have with the Levaquin wanted [place] to see about repeating cultures. If it indeed grows the  Pseudomonas again then he recommended a possibility of considering a PICC line placement and IV antibiotic therapy. He plans to see the patient back in 1 to 2 weeks. 08/02/2019 upon evaluation today patient appears to be doing poorly with regard to his left lower extremity. We did get the results of  his culture back it shows that he is still showing evidence of Pseudomonas which is consistent with the purulent/blue-green drainage that he has currently. Subsequently the culture also shows that he now is showing resistance to the oral fluoroquinolones which is unfortunate as that was really the only thing to treat the infection prior. I do believe that he is looking like this is going require IV antibiotic therapy to get this under control. Fortunately there is no signs of systemic infection at this time which is good news. The patient does see Dr. Megan Salon tomorrow. 08/09/2019 upon evaluation today patient appears to be doing better with regard to his left lower extremity ulcer in regard to the overall appearance. He is currently on IV antibiotic therapy. As ordered by Dr. Megan Salon. Currently the patient is on ceftazidime which she is going to take for the next 2 weeks and then follow-up for 4 to 5-week appointment with Dr. Megan Salon. The patient started this this past Friday symptoms have not for a total of 3 days currently in full. 08/16/2019 upon evaluation today patient's wound actually does show muscle in the base of the wound but in general does appear to be much better as far as the overall evidence of infection is concerned. In fact I feel like this is for the most part cleared up he still on the IV antibiotics he has not completed the full course yet but I think he is doing much better which is excellent news. 08/23/2019 upon evaluation today patient appears to be doing about the same with regard to his wound at this point. He tells me that he still has pain unfortunately. Fortunately there is no  evidence of systemic infection at this time which is great news. There is significant muscle protrusion. 09/13/19 upon evaluation today patient appears to be doing about the same in regard to his leg unfortunately. He still has a lot of drainage coming from the ulceration there is still muscle exposed. With that being said the patient's last wound culture still showed an intermediate finding with regard to the Pseudomonas he still having the bluish/green drainage as well. Overall I do not know that the wound has completely cleared of infection at this point. Fortunately there is no signs of active infection systemically at this point which is good news. [name], [name] ([id number]) Objective Constitutional Obese and well-hydrated in no acute distress. Vitals Time Taken: 8:23 AM, Height: 71 in, Weight: 338 lbs, BMI: 47.1, Temperature: 98.3 F, Pulse: 78 bpm, Respiratory Rate: 16 breaths/min, Blood Pressure: 151/88 mmHg. Respiratory normal breathing without difficulty. Psychiatric this patient is able to make decisions and demonstrates good insight into disease process. Alert and Oriented x 3. pleasant and cooperative. General Notes: Upon inspection patient's wound bed actually showed signs of good granulation in some of the surrounding areas although he did still have a significant area of muscle exposure. We do need to try to get tissue growth over top of this and I think the infection continuing has somewhat limited the ability in that regard. With that being said the patient still tells me that his pain is less than it was previous which is good news. Integumentary (Hair, Skin) Wound #1 status is Open. Original cause of wound was Gradually Appeared. The wound is located on the Left,Lateral Lower Leg. The wound measures 6.7cm length x 7.2cm width x 1.5cm depth; 37.888cm^2 area and 56.831cm^3 volume. There is muscle and Fat Layer (Subcutaneous Tissue) Exposed exposed. There is no tunneling or  undermining noted.  There is a medium amount of serous drainage noted. The wound margin is epibole. There is medium (34-66%) pink granulation within the wound bed. There is a medium (34-66%) amount of necrotic tissue within the wound bed including Adherent Slough. 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 Anesthetic (add to Medication List): Wound #1 Left,Lateral Lower Leg: Topical Lidocaine 4% cream applied to wound bed prior to debridement (In Clinic Only). Primary Wound Dressing: Gentamicin Sulfate Cream Secondary Dressing: Wound #1 Left,Lateral Lower Leg: Boardered Foam Dressing Dressing Change Frequency: Wound #1 Left,Lateral Lower Leg: Change dressing every day. Follow-up Appointments: Wound #1 Left,Lateral Lower Leg: Return Appointment in 1 week. Edema Control: Wound #1 Left,Lateral Lower Leg: Elevate legs to the level of the heart and pump ankles as often as possible The following medication(s) was prescribed: gentamicin topical 0.1 % cream cream topical applied in a thin film daily to the wound bed as directed in the clinic. starting 09/13/2019 1. I would recommend currently that we continue with the wound care measures as before currently with the bordered foam dressing. When differences I am going to send in prescription for gentamicin cream to be used in a thin film on the wound and then followed by the border foam. He is changing this daily which again I think is fine as far as try to keep things from becoming too saturated. 2. I am also can recommend that he clean the area with saline in between dressing changes to try to keep it as clean as possible. 3. He does have a follow-up with infectious disease on Wednesday we will see what  they have to say as well as far as treatment going forward the patient is wondering if he needs additional IV antibiotic therapy. Again based on the repeat culture there is no oral option as far as treatment is concerned that is good to be effective therefore if he needs anything more than just a topical he is going to require IV antibiotics. During the time that the patient was on the IV antibiotic therapy as prescribed by the infectious disease specialist his drainage dramatically improved and in fact pretty much completely resolved. After he been off of it for several days this seemed to begin again unfortunately that is why the patient is thinking that he may need additional antibiotics at this point. We will see patient back for reevaluation in 1 week here in the clinic. If anything worsens or changes patient will contact our office for additional recommendations. Electronic Signature(s) Signed: 09/13/2019 10:41:51 AM By: Worthy Keeler PA-C Previous Signature: 09/13/2019 10:40:55 AM Version By: Worthy Keeler PA-C Entered By: Worthy Keeler on 09/13/2019 10:41:50 [name] ([id number]) -------------------------------------------------------------------------------- SuperBill Details Patient Name: [name] Date of Service: 09/13/2019 Medical Record [id number] Patient Account Number: [id number] Date of Birth/Sex: Aug 01, 1980 (39 y.o. M) Treating RN: Army Melia Primary Care Provider: [name] Other Clinician: Referring Provider: [name] Treating Provider/Extender:STONE III, Tykwon Fera Weeks in Treatment: 144 Diagnosis Coding ICD-10 Codes Code Description [phone] Non-pressure chronic ulcer of left calf with fat layer exposed L88 Pyoderma gangrenosum I87.2 Venous insufficiency (chronic) (peripheral) L03.116 Cellulitis of left lower limb Facility Procedures CPT4 Code: 17915056 Description: 99213 - WOUND CARE VISIT-LEV 3 EST PT Modifier: Quantity: 1 Physician  Procedures CPT4 Code: 9794801 Description: 65537 - WC PHYS LEVEL 4 -  EST PT ICD-10 Diagnosis Description L97.222 Non-pressure chronic ulcer of left calf with fat layer exposed L88 Pyoderma gangrenosum I87.2 Venous insufficiency (chronic) (peripheral) L03.116 Cellulitis of left lower  limb Modifier: Quantity: 1 Electronic Signature(s) Signed: 09/13/2019 10:41:13 AM By: Worthy Keeler PA-C Entered By: Worthy Keeler on 09/13/2019 10:41:12

## 2019-09-14 ENCOUNTER — Telehealth: Payer: Self-pay

## 2019-09-14 NOTE — Telephone Encounter (Signed)
COVID-19 Pre-Screening Questions:09/14/19  Do you currently have a fever (>100 F), chills or unexplained body aches? NO   Are you currently experiencing new cough, shortness of breath, sore throat, runny nose? NO .  Have you recently travelled outside the state of [place] in the last 14 days? NO .  Have you been in contact with someone that is currently pending confirmation of Covid19 testing or has been confirmed to have the Covid19 virus?  NO  **If the patient answers NO to ALL questions -  advise the patient to please call the clinic before coming to the office should any symptoms develop.     

## 2019-09-15 ENCOUNTER — Other Ambulatory Visit: Payer: Self-pay

## 2019-09-15 ENCOUNTER — Encounter: Payer: Self-pay | Admitting: Internal Medicine

## 2019-09-15 ENCOUNTER — Ambulatory Visit (INDEPENDENT_AMBULATORY_CARE_PROVIDER_SITE_OTHER): Payer: 59 | Admitting: Internal Medicine

## 2019-09-15 DIAGNOSIS — L88 Pyoderma gangrenosum: Secondary | ICD-10-CM | POA: Diagnosis not present

## 2019-09-15 NOTE — Unmapped (Signed)
This encounter was created in error - please disregard.

## 2019-09-15 NOTE — Assessment & Plan Note (Signed)
His chronic wound is enlarging.  6 weeks ago I measured it at just over 5 cm and now it is a little over 7 cm in diameter.  The etiology of the wound is multifactorial.  Infection is probably playing a part but I am reluctant to keep him on IV ceftazidime without making other changes to the impact the underlying pyoderma.  It makes more sense to me to consider using IV ceftazidime again after he changes to Remicade.  He will follow-up here in mid May.

## 2019-09-15 NOTE — Progress Notes (Signed)
[facility]  Patient Active Problem List   Diagnosis Date Noted  . 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 Normocytic anemia 07/07/2019  . Former cigarette smoker 06/17/2018  . Ulcerative colitis ([place]) 03/05/2018  . Venous stasis 02/24/2018  . Chronic pain of left knee 12/05/2017  . Chronic pain of left ankle 12/05/2017  . Type 2 diabetes mellitus ([place]) 07/15/2017  . Seasonal allergic rhinitis 11/25/2016  . Depression 04/17/2015  . Hyperlipidemia 04/17/2015    Patient's Medications  New Prescriptions   No medications on file  Previous Medications   ACETAMINOPHEN (TYLENOL) 500 MG TABLET    Take 1,000 mg by mouth every 6 (six) hours as needed for headache (pain).   ATORVASTATIN (LIPITOR) 40 MG TABLET    Take 1 tablet (40 mg total) by mouth every evening. For cholesterol.   BLOOD GLUCOSE METER KIT AND SUPPLIES KIT    Dispense based on patient and insurance preference. Use up to four times daily as directed. (FOR ICD-9 250.00, 250.01).   CEFTAZIDIME 2 G IN DEXTROSE 5 % 50 ML    Inject 2 g into the vein every 8 (eight) hours.   CLOBETASOL OINTMENT (TEMOVATE) 0.05 %    Apply 1 application topically 2 (two) times daily.    FLUTICASONE (FLONASE) 50 MCG/ACT NASAL SPRAY    Place 1 spray into both nostrils 2 (two) times daily.   HUMIRA PEN 40 MG/0.8ML PNKT    Inject 1 Syringe into the skin every 14 (fourteen) days.   IBUPROFEN (ADVIL,MOTRIN) 200 MG TABLET    Take 400-600 mg by mouth every 6 (six) hours as needed for headache (pain).   INSULIN GLARGINE (LANTUS SOLOSTAR) 100 UNIT/ML SOLOSTAR PEN    Inject 20 Units into the skin at bedtime.   INSULIN PEN NEEDLE (PEN NEEDLES) 31G X 6 MM MISC    Use nightly with insulin   METFORMIN (GLUCOPHAGE) 1000 MG TABLET    Take 1 tablet (1,000 mg total) by mouth 2 (two) times daily with a meal. For diabetes.   MUPIROCIN OINTMENT (BACTROBAN) 2 %    Apply topically two (2) 60g times a day. Mix with clobetasol  and apply twice daily   VENLAFAXINE XR (EFFEXOR-XR) 150 MG 24 HR CAPSULE    TAKE 1 CAPSULE BY MOUTH ONCE DAILY WITH BREAKFAST   VENLAFAXINE XR (EFFEXOR-XR) 37.5 MG 24 HR CAPSULE    Take 1 capsule (37.5 mg total) by mouth daily with breakfast. For depression  Modified Medications   No medications on file  Discontinued Medications   No medications on file    Subjective: [place] is in for his routine follow-up visit.  He completed a 2-week course of IV ceftazidime on 08/20/2019 for Pseudomonas infection of his chronic left calf wound.  He said that the drainage decreased and the wound was looking much better at the completion of his antibiotic therapy but has started to worsen again.  He has been approved to switch from Humira to Remicade but is not sure when that switch will occur.  He meets with his dermatologist at [facility] 09/24/2019.  Review of Systems: Review of Systems  Constitutional: Negative for chills, diaphoresis and fever.    Past Medical History:  Diagnosis Date  . Blood in stool   . Depression   . Elevated blood pressure   . Hyperlipidemia   . OSA (obstructive sleep apnea) 2014  . Seasonal allergies   . Ulcerative colitis (  [facility]) 03/05/2018    Social History   Tobacco Use  . Smoking status: Former Smoker    Packs/day: 2.00    Years: 14.00    Pack years: 28.00    Types: Cigarettes    Quit date: 11/25/2018    Years since quitting: 0.8  . Smokeless tobacco: Former User    Quit date: 03/17/2015  Substance Use Topics  . Alcohol use: Yes    Alcohol/week: 0.0 standard drinks    Comment: soical  . Drug use: Not on file    Family History  Problem Relation Age of Onset  . Alcohol abuse Paternal Aunt   . Alcohol abuse Paternal Uncle   . Stroke Paternal Uncle   . Hyperlipidemia Maternal Grandfather   . Hypertension Maternal Grandfather   . Diabetes Maternal Grandfather   . Alcohol abuse Maternal Grandfather   . Multiple sclerosis Mother   . Dementia Mother     Allergies    Allergen Reactions  . Biaxin [Clarithromycin] Other (See Comments)    Causes colitis flares  . Milk-Related Compounds Hives    Objective: Vitals:   09/15/19 1525  BP: 133/82  Pulse: 84  Temp: 98.4 F (36.9 C)  TempSrc: Oral  Weight: (!) 354 lb (160.6 kg)   Body mass index is 49.37 kg/m.  Physical Exam Constitutional:      General: He is not in acute distress. Musculoskeletal:     Comments: His chronic wound is much larger than it was 6 weeks ago.  He has copious yellow-green drainage.  Neurological:     Mental Status: He is alert.          Problem List Items Addressed This Visit      High   Pyoderma gangrenosum    His chronic wound is enlarging.  6 weeks ago I measured it at just over 5 cm and now it is a little over 7 cm in diameter.  The etiology of the wound is multifactorial.  Infection is probably playing a part but I am reluctant to keep him on IV ceftazidime without making other changes to the impact the underlying pyoderma.  It makes more sense to me to consider using IV ceftazidime again after he changes to Remicade.  He will follow-up here in mid May.           , MD [facility] [phone] pager   [phone] cell 09/15/2019, 3:55 PM 

## 2019-09-16 NOTE — Progress Notes (Signed)
[name], [name] ([id number]) Visit Report for 09/13/2019 Arrival Information Details Patient Name: [name], [name] Date of Service: 09/13/2019 8:15 AM Medical Record Number: [id number] Patient Account Number: [id number] Date of Birth/Sex: February 27, 1981 (39 y.o. M) Treating RN: [name] Primary Care Casady Voshell: [name] Other Clinician: Referring Shamon Cothran: [name] Treating Dawsyn Zurn/Extender: Melburn Hake, [name]: 69 Visit Information History Since Last Visit Added or deleted any medications: No Patient Arrived: Ambulatory Pain Present Now: No Arrival Time: 08:22 Accompanied By: self Transfer Assistance: None Patient Identification Verified: Yes Secondary Verification Process Completed: Yes Patient Requires Transmission-Based No Precautions: Patient Has Alerts: Yes Patient Alerts: 08/09/19 Picc Line Right Electronic Signature(s) Signed: 09/15/2019 4:39:52 PM By: [name], [name], RN, [name], [name], [name] Entered By: [name], [name], RN, [name], [name] on 09/13/2019 08:23:01 [name] ([id number]) -------------------------------------------------------------------------------- Clinic Level of Care Assessment Details Patient Name: [name] Date of Service: 09/13/2019 8:15 AM Medical Record Number: [id number] Patient Account Number: [id number] Date of Birth/Sex: Nov 18, 1980 (39 y.o. M) Treating RN: Army Melia Primary Care Corisa Montini: [name] Other Clinician: Referring Dondra Rhett: [name] Treating Adith Tejada/Extender: Melburn Hake, [name]: 144 Clinic Level of Care Assessment Items TOOL 4 Quantity Score []  - Use when only an EandM is performed on FOLLOW-UP visit 0 ASSESSMENTS - Nursing Assessment / Reassessment X - Reassessment of Co-morbidities (includes updates in patient status) 1 10 X- 1 5 Reassessment of Adherence to Treatment Plan ASSESSMENTS - Wound and Skin Assessment / Reassessment X - Simple Wound Assessment / Reassessment - one wound 1  5 []  - 0 Complex Wound Assessment / Reassessment - multiple wounds []  - 0 Dermatologic / Skin Assessment (not related to wound area) ASSESSMENTS - Focused Assessment []  - Circumferential Edema Measurements - multi extremities 0 []  - 0 Nutritional Assessment / Counseling / Intervention []  - 0 Lower Extremity Assessment (monofilament, tuning fork, pulses) []  - 0 Peripheral Arterial Disease Assessment (using hand held doppler) ASSESSMENTS - Ostomy and/or Continence Assessment and Care []  - Incontinence Assessment and Management 0 []  - 0 Ostomy Care Assessment and Management (repouching, etc.) PROCESS - Coordination of Care X - Simple Patient / Family Education for ongoing care 1 15 []  - 0 Complex (extensive) Patient / Family Education for ongoing care []  - 0 Staff obtains Programmer, systems, Records, Test Results / Process Orders []  - 0 Staff telephones HHA, Nursing Homes / Clarify orders / etc []  - 0 Routine Transfer to another Facility (non-emergent condition) []  - 0 Routine Hospital Admission (non-emergent condition) []  - 0 New Admissions / Biomedical engineer / Ordering NPWT, Apligraf, etc. []  - 0 Emergency Hospital Admission (emergent condition) X- 1 10 Simple Discharge Coordination []  - 0 Complex (extensive) Discharge Coordination PROCESS - Special Needs []  - Pediatric / Minor Patient Management 0 []  - 0 Isolation Patient Management []  - 0 Hearing / Language / Visual special needs []  - 0 Assessment of Community assistance (transportation, D/C planning, etc.) []  - 0 Additional assistance / Altered mentation []  - 0 Support Surface(s) Assessment (bed, cushion, seat, etc.) INTERVENTIONS - Wound Cleansing / Measurement Petrelli, Taelon ([id number]) X- 1 5 Simple Wound Cleansing - one wound []  - 0 Complex Wound Cleansing - multiple wounds X- 1 5 Wound Imaging (photographs - any number of wounds) []  - 0 Wound Tracing (instead of photographs) X- 1 5 Simple Wound  Measurement - one wound []  - 0 Complex Wound Measurement - multiple wounds INTERVENTIONS - Wound Dressings []  - Small Wound Dressing one or multiple wounds 0 X- 1  15 Medium Wound Dressing one or multiple wounds []  - 0 Large Wound Dressing one or multiple wounds []  - 0 Application of Medications - topical []  - 0 Application of Medications - injection INTERVENTIONS - Miscellaneous []  - External ear exam 0 []  - 0 Specimen Collection (cultures, biopsies, blood, body fluids, etc.) []  - 0 Specimen(s) / Culture(s) sent or taken to Lab for analysis []  - 0 Patient Transfer (multiple staff / Civil Service fast streamer / Similar devices) []  - 0 Simple Staple / Suture removal (25 or less) []  - 0 Complex Staple / Suture removal (26 or more) []  - 0 Hypo / Hyperglycemic Management (close monitor of Blood Glucose) []  - 0 Ankle / Brachial Index (ABI) - do not check if billed separately X- 1 5 Vital Signs Has the patient been seen at the hospital within the last three years: Yes Total Score: 80 Level Of Care: New/Established - Level 3 Electronic Signature(s) Signed: 09/13/2019 4:20:49 PM By: Army Melia Entered By: Army Melia on 09/13/2019 08:51:57 [name] ([id number]) -------------------------------------------------------------------------------- Encounter Discharge Information Details Patient Name: [name] Date of Service: 09/13/2019 8:15 AM Medical Record Number: [id number] Patient Account Number: [id number] Date of Birth/Sex: [birth year] (39 y.o. M) Treating RN: Army Melia Primary Care [place]: [name] Other Clinician: Referring Finlee Milo: [name] Treating Venesha Petraitis/Extender: Melburn Hake, [name]: 144 Encounter Discharge Information Items Discharge Condition: Stable Ambulatory Status: Ambulatory Discharge Destination: Home Transportation: Private Auto Accompanied By: self Schedule Follow-up Appointment: Yes Clinical Summary of Care: Electronic  Signature(s) Signed: 09/13/2019 4:20:49 PM By: Army Melia Entered By: Army Melia on 09/13/2019 08:52:43 [name] ([id number]) -------------------------------------------------------------------------------- Lower Extremity Assessment Details Patient Name: [name] Date of Service: 09/13/2019 8:15 AM Medical Record Number: [id number] Patient Account Number: [id number] Date of Birth/Sex: 05-19-1981 (39 y.o. M) Treating RN: [name] Primary Care [name] Pinkham: [name] Other Clinician: Referring Anthoni Geerts: [name] Treating Macalister Arnaud/Extender: Melburn Hake, [name]: 144 Edema Assessment Assessed: [Left: No] [Right: No] [Left: Edema] [Right: :] Calf Left: Right: Point of Measurement: 32 cm From Medial Instep 50.5 cm cm Ankle Left: Right: Point of Measurement: 10 cm From Medial Instep 30.4 cm cm Vascular Assessment Pulses: Dorsalis Pedis Palpable: [Left:Yes] Electronic Signature(s) Signed: 09/15/2019 4:39:52 PM By: [name], [name], RN, [name], [name], [name] Entered By: [name], [name], RN, [name], [name] on 09/13/2019 08:28:59 [name] ([id number]) -------------------------------------------------------------------------------- Multi Wound Chart Details Patient Name: [name] Date of Service: 09/13/2019 8:15 AM Medical Record Number: [id number] Patient Account Number: [id number] Date of Birth/Sex: 12/14/1980 (39 y.o. M) Treating RN: Army Melia Primary Care Kammie Scioli: [name] Other Clinician: Referring Domique Clapper: [name] Treating Katheleen Stella/Extender: Melburn Hake, [name]: 144 Vital Signs Height(in): 71 Pulse(bpm): 78 Weight(lbs): 338 Blood Pressure(mmHg): 151/88 Body Mass Index(BMI): 47 Temperature(F): 98.3 Respiratory Rate(breaths/min): 16 Photos: [N/A:N/A] Wound Location: Left, Lateral Lower Leg N/A N/A Wounding Event: Gradually Appeared N/A N/A Primary Etiology: Pyoderma N/A N/A Comorbid History: Sleep Apnea,  Hypertension, Colitis N/A N/A Date Acquired: 11/18/2015 N/A N/A Weeks of Treatment: 144 N/A N/A Wound Status: Open N/A N/A Measurements L x W x D (cm) 6.7x7.2x1.5 N/A N/A Area (cm) : 37.888 N/A N/A Volume (cm) : 56.831 N/A N/A % Reduction in Area: -671.80% N/A N/A % Reduction in Volume: -1347.20% N/A N/A Classification: Full Thickness With Exposed N/A N/A Support Structures Exudate Amount: Medium N/A N/A Exudate Type: Serous N/A N/A Exudate Color: amber N/A N/A Wound Margin: Epibole N/A N/A Granulation Amount: Medium (34-66%) N/A N/A Granulation Quality: Pink N/A  N/A Necrotic Amount: Medium (34-66%) N/A N/A Exposed Structures: Fat Layer (Subcutaneous Tissue) N/A N/A Exposed: Yes Muscle: Yes Fascia: No Tendon: No Joint: No Bone: No Epithelialization: None N/A N/A Treatment Notes Electronic Signature(s) Signed: 09/13/2019 4:20:49 PM By: Army Melia Entered By: Army Melia on 09/13/2019 08:49:22 [name] ([id number]) -------------------------------------------------------------------------------- Multi-Disciplinary Care Plan Details Patient Name: [name] Date of Service: 09/13/2019 8:15 AM Medical Record Number: [id number] Patient Account Number: [id number] Date of Birth/Sex: Oct 04, 1980 (39 y.o. M) Treating RN: Army Melia Primary Care Shalaunda Weatherholtz: [name] Other Clinician: Referring Hopie Pellegrin: [name] Treating Manish Ruggiero/Extender: Melburn Hake, [name]: 144 Active Inactive Venous Leg Ulcer Nursing Diagnoses: Knowledge deficit related to disease process and management Potential for venous Insuffiency (use before diagnosis confirmed) Goals: Non-invasive venous studies are completed as ordered Date Initiated: 12/11/2016 Target Resolution Date: 03/13/2017 Goal Status: Active Patient will maintain optimal edema control Date Initiated: 12/11/2016 Target Resolution Date: 03/13/2017 Goal Status: Active Patient/caregiver will verbalize  understanding of disease process and disease management Date Initiated: 12/11/2016 Target Resolution Date: 03/13/2017 Goal Status: Active Verify adequate tissue perfusion prior to therapeutic compression application Date Initiated: 12/11/2016 Target Resolution Date: 03/13/2017 Goal Status: Active Interventions: Assess peripheral edema status every visit. Compression as ordered Provide education on venous insufficiency Treatment Activities: Therapeutic compression applied : 12/11/2016 Notes: Wound/Skin Impairment Nursing Diagnoses: Impaired tissue integrity Knowledge deficit related to ulceration/compromised skin integrity Goals: Patient/caregiver will verbalize understanding of skin care regimen Date Initiated: 12/11/2016 Target Resolution Date: 03/13/2017 Goal Status: Active Ulcer/skin breakdown will have a volume reduction of 30% by week 4 Date Initiated: 12/11/2016 Target Resolution Date: 03/13/2017 Goal Status: Active Ulcer/skin breakdown will have a volume reduction of 50% by week 8 Date Initiated: 12/11/2016 Target Resolution Date: 03/13/2017 Goal Status: Active Ulcer/skin breakdown will have a volume reduction of 80% by week 12 Date Initiated: 12/11/2016 Target Resolution Date: 03/13/2017 Goal Status: Active Ulcer/skin breakdown will heal within 14 weeks Date Initiated: 12/11/2016 Target Resolution Date: 03/13/2017 Goal Status: Active RANDEL, HARGENS ([id number]) Interventions: Assess patient/caregiver ability to obtain necessary supplies Assess patient/caregiver ability to perform ulcer/skin care regimen upon admission and as needed Assess ulceration(s) every visit Provide education on ulcer and skin care Treatment Activities: Skin care regimen initiated : 12/11/2016 Topical wound management initiated : 12/11/2016 Notes: Electronic Signature(s) Signed: 09/13/2019 4:20:49 PM By: Army Melia Entered By: Army Melia on 09/13/2019 08:49:13 [name]  ([id number]) -------------------------------------------------------------------------------- Pain Assessment Details Patient Name: [name] Date of Service: 09/13/2019 8:15 AM Medical Record Number: [id number] Patient Account Number: [id number] Date of Birth/Sex: [birth year] (39 y.o. M) Treating RN: [name] Primary Care Kezia Benevides: [name] Other Clinician: Referring [place]: [name] Treating Zaryah Seckel/Extender: Melburn Hake, [name]: 144 Active Problems Location of Pain Severity and Description of Pain Patient Has Paino No Site Locations Pain Management and Medication Current Pain Management: Notes Patient denies pain at this time. Electronic Signature(s) Signed: 09/15/2019 4:39:52 PM By: [name], [name], RN, [name], [name], [name] Entered By: [name], [name], RN, [name], [name] on 09/13/2019 08:23:32 MARVEN, VELEY ([id number]) -------------------------------------------------------------------------------- Patient/Caregiver Education Details Patient Name: [name] Date of Service: 09/13/2019 8:15 AM Medical Record Number: [id number] Patient Account Number: [id number] Date of Birth/Gender: 1980/07/29 (39 y.o. M) Treating RN: Army Melia Primary Care Physician: [name] Other Clinician: Referring Physician: [name] Treating Physician/Extender: Sharalyn Ink in Treatment: 144 Education Assessment Education Provided To: Patient Education Topics Provided Wound/Skin Impairment: Handouts: Caring for Your Ulcer Methods: Demonstration, Explain/Verbal Responses: State content correctly Electronic Signature(s)  Signed: 09/13/2019 4:20:49 PM By: Army Melia Entered By: Army Melia on 09/13/2019 08:52:07 [name] ([id number]) -------------------------------------------------------------------------------- Wound Assessment Details Patient Name: [name] Date of Service: 09/13/2019 8:15 AM Medical Record Number: [id number] Patient  Account Number: [id number] Date of Birth/Sex: 09/02/1980 (39 y.o. M) Treating RN: [name] Primary Care Cyruss Arata: [name] Other Clinician: Referring Nasif Bos: [name] Treating Avaiah Stempel/Extender: Melburn Hake, [name]: 144 Wound Status Wound Number: 1 Primary Etiology: Pyoderma Wound Location: Left, Lateral Lower Leg Wound Status: Open Wounding Event: Gradually Appeared Comorbid History: Sleep Apnea, Hypertension, Colitis Date Acquired: 11/18/2015 Weeks Of Treatment: 144 Clustered Wound: No Photos Wound Measurements Length: (cm) 6.7 Width: (cm) 7.2 Depth: (cm) 1.5 Area: (cm) 37.888 Volume: (cm) 56.831 % Reduction in Area: -671.8% % Reduction in Volume: -1347.2% Epithelialization: None Tunneling: No Undermining: No Wound Description Classification: Full Thickness With Exposed Support Structures Wound Margin: Epibole Exudate Amount: Medium Exudate Type: Serous Exudate Color: amber Foul Odor After Cleansing: No Slough/Fibrino Yes Wound Bed Granulation Amount: Medium (34-66%) Exposed Structure Granulation Quality: Pink Fascia Exposed: No Necrotic Amount: Medium (34-66%) Fat Layer (Subcutaneous Tissue) Exposed: Yes Necrotic Quality: Adherent Slough Tendon Exposed: No Muscle Exposed: Yes Necrosis of Muscle: No Joint Exposed: No Bone Exposed: No Treatment Notes Wound #1 (Left, Lateral Lower Leg) Notes gentamycin, BFD Anagnos, Tramell ([id number]) Electronic Signature(s) Signed: 09/15/2019 4:39:52 PM By: [name], [name], RN, [name], [name], [name] Entered By: [name], [name], RN, [name], [name] on 09/13/2019 77:37:36 [name] ([id number]) -------------------------------------------------------------------------------- Vitals Details Patient Name: [name] Date of Service: 09/13/2019 8:15 AM Medical Record Number: [id number] Patient Account Number: [id number] Date of Birth/Sex: 1981-02-17 (39 y.o. M) Treating RN: [name] Primary Care Rodrigo Mcgranahan: [name] Other Clinician: Referring Dalan Cowger: [name] Treating Kalena Mander/Extender: Melburn Hake, [name]: 144 Vital Signs Time Taken: 08:23 Temperature (F): 98.3 Height (in): 71 Pulse (bpm): 78 Weight (lbs): 338 Respiratory Rate (breaths/min): 16 Body Mass Index (BMI): 47.1 Blood Pressure (mmHg): 151/88 Reference Range: 80 - 120 mg / dl Electronic Signature(s) Signed: 09/15/2019 4:39:52 PM By: [name], [name], RN, [name], [name], [name] Entered By: [name], [name], RN, [name], [name] on 09/13/2019 08:23:18

## 2019-09-19 LAB — QUANTIFERON TB GOLD: Mycobacterium tuberculosis stimulated gamma interferon:Imp:Pt:Bld:Ord:: NEGATIVE

## 2019-09-19 LAB — HEPATITIS B SURFACE ANTIGEN: Hepatitis B virus surface Ag:PrThr:Pt:Ser/Plas:Ord:IA: NEGATIVE

## 2019-09-19 LAB — CBC W/ DIFFERENTIAL
BANDED NEUTROPHILS ABSOLUTE COUNT: 0 10*3/uL (ref 0.0–0.1)
BASOPHILS ABSOLUTE COUNT: 0 10*3/uL (ref 0.0–0.2)
BASOPHILS RELATIVE PERCENT: 0 %
EOSINOPHILS ABSOLUTE COUNT: 0.3 10*3/uL (ref 0.0–0.4)
EOSINOPHILS RELATIVE PERCENT: 4 %
HEMATOCRIT: 42.9 % (ref 37.5–51.0)
HEMOGLOBIN: 13.8 g/dL (ref 13.0–17.7)
IMMATURE GRANULOCYTES: 0 %
LYMPHOCYTES ABSOLUTE COUNT: 1.9 10*3/uL (ref 0.7–3.1)
LYMPHOCYTES RELATIVE PERCENT: 28 %
MEAN CORPUSCULAR HEMOGLOBIN CONC: 32.2 g/dL (ref 31.5–35.7)
MEAN CORPUSCULAR HEMOGLOBIN: 28.9 pg (ref 26.6–33.0)
MEAN CORPUSCULAR VOLUME: 90 fL (ref 79–97)
MONOCYTES ABSOLUTE COUNT: 0.5 10*3/uL (ref 0.1–0.9)
MONOCYTES RELATIVE PERCENT: 8 %
NEUTROPHILS RELATIVE PERCENT: 60 %
PLATELET COUNT: 253 10*3/uL (ref 150–450)
RED BLOOD CELL COUNT: 4.77 x10E6/uL (ref 4.14–5.80)
RED CELL DISTRIBUTION WIDTH: 12.1 % (ref 11.6–15.4)

## 2019-09-19 LAB — ALT (SGPT): Alanine aminotransferase:CCnc:Pt:Ser/Plas:Qn:: 52 — ABNORMAL HIGH

## 2019-09-19 LAB — QUANTIFERON TB GOLD PLUS
QUANTIFERON MITOGEN VALUE: >10 [IU]/mL
QUANTIFERON NIL VALUE: 0.01 [IU]/mL
QUANTIFERON TB GOLD: NEGATIVE
QUANTIFERON TB1 AG VALUE: 0.02 [IU]/mL

## 2019-09-19 LAB — AST (SGOT): Aspartate aminotransferase:CCnc:Pt:Ser/Plas:Qn:: 32

## 2019-09-19 LAB — HEPATITIS B CORE TOTAL ANTIBODY: Hepatitis B virus core Ab:PrThr:Pt:Ser/Plas:Ord:IA: NEGATIVE

## 2019-09-19 LAB — HEPATITIS C ANTIBODY: HEP C VIRUS AB: <0.1 {s_co_ratio} (ref 0.0–0.9)

## 2019-09-19 LAB — BLOOD UREA NITROGEN: Urea nitrogen:MCnc:Pt:Ser/Plas:Qn:: 12

## 2019-09-19 LAB — HEP C VIRUS AB: Hepatitis C virus Ab Signal/Cutoff:RelACnc:Pt:Ser/Plas:Qn:IA: <0.1

## 2019-09-19 LAB — CREATININE
Creatinine:MCnc:Pt:Ser/Plas:Qn:: 0.99
GFR MDRD AF AMER: 110 mL/min/{1.73_m2}

## 2019-09-19 LAB — HEP B SURFACE AB, QUAL: Hepatitis B virus surface Ab:PrThr:Pt:Ser:Ord:: NONREACTIVE

## 2019-09-19 LAB — BANDED NEUTROPHILS ABSOLUTE COUNT: Granulocytes.immature:NCnc:Pt:Bld:Qn:Automated count: 0

## 2019-09-20 ENCOUNTER — Other Ambulatory Visit: Payer: Self-pay

## 2019-09-20 ENCOUNTER — Encounter: Payer: 59 | Attending: Physician Assistant | Admitting: Physician Assistant

## 2019-09-20 DIAGNOSIS — L97213 Non-pressure chronic ulcer of right calf with necrosis of muscle: Secondary | ICD-10-CM | POA: Diagnosis not present

## 2019-09-20 DIAGNOSIS — G4730 Sleep apnea, unspecified: Secondary | ICD-10-CM | POA: Diagnosis not present

## 2019-09-20 DIAGNOSIS — E669 Obesity, unspecified: Secondary | ICD-10-CM | POA: Diagnosis not present

## 2019-09-20 DIAGNOSIS — L88 Pyoderma gangrenosum: Secondary | ICD-10-CM | POA: Diagnosis not present

## 2019-09-20 DIAGNOSIS — Z79899 Other long term (current) drug therapy: Secondary | ICD-10-CM | POA: Insufficient documentation

## 2019-09-20 DIAGNOSIS — L03116 Cellulitis of left lower limb: Secondary | ICD-10-CM | POA: Insufficient documentation

## 2019-09-20 DIAGNOSIS — K5190 Ulcerative colitis, unspecified, without complications: Secondary | ICD-10-CM | POA: Diagnosis not present

## 2019-09-20 DIAGNOSIS — Z7952 Long term (current) use of systemic steroids: Secondary | ICD-10-CM | POA: Diagnosis not present

## 2019-09-20 DIAGNOSIS — I872 Venous insufficiency (chronic) (peripheral): Secondary | ICD-10-CM | POA: Insufficient documentation

## 2019-09-20 DIAGNOSIS — F17210 Nicotine dependence, cigarettes, uncomplicated: Secondary | ICD-10-CM | POA: Insufficient documentation

## 2019-09-20 DIAGNOSIS — L97222 Non-pressure chronic ulcer of left calf with fat layer exposed: Secondary | ICD-10-CM | POA: Insufficient documentation

## 2019-09-20 DIAGNOSIS — Z6841 Body Mass Index (BMI) 40.0 and over, adult: Secondary | ICD-10-CM | POA: Diagnosis not present

## 2019-09-20 DIAGNOSIS — I10 Essential (primary) hypertension: Secondary | ICD-10-CM | POA: Diagnosis not present

## 2019-09-20 DIAGNOSIS — Z888 Allergy status to other drugs, medicaments and biological substances status: Secondary | ICD-10-CM | POA: Diagnosis not present

## 2019-09-20 DIAGNOSIS — Z91011 Allergy to milk products: Secondary | ICD-10-CM | POA: Diagnosis not present

## 2019-09-20 DIAGNOSIS — Z881 Allergy status to other antibiotic agents status: Secondary | ICD-10-CM | POA: Insufficient documentation

## 2019-09-20 NOTE — Progress Notes (Signed)
TALTON, DELPRIORE ([id number]) Visit Report for 09/20/2019 Arrival Information Details Patient Name: [name], [name] Date of Service: 09/20/2019 8:15 AM Medical Record Number: [id number] Patient Account Number: [id number] Date of Birth/Sex: [birth year] (39 y.o. M) Treating RN: Montey Hora Primary Care Jaycen Vercher: [name] Other Clinician: Referring Josilyn Shippee: [name] Treating Amberley Hamler/Extender: Melburn Hake, [name]: 145 Visit Information History Since Last Visit Added or deleted any medications: No Patient Arrived: Ambulatory Any new allergies or adverse reactions: No Arrival Time: 08:18 Had a fall or experienced change in No Accompanied By: self activities of daily living that may affect Transfer Assistance: None risk of falls: Patient Identification Verified: Yes Signs or symptoms of abuse/neglect since last visito No Secondary Verification Process Completed: Yes Hospitalized since last visit: No Patient Requires Transmission-Based No Implantable device outside of the clinic excluding No Precautions: cellular tissue based products placed in the center Patient Has Alerts: Yes since last visit: Patient Alerts: 08/09/19 Picc Line Has Dressing in Place as Prescribed: Yes Right Has Compression in Place as Prescribed: Yes Pain Present Now: No Electronic Signature(s) Signed: 09/20/2019 4:26:56 PM By: Montey Hora Entered By: Montey Hora on 09/20/2019 08:21:45 [name] ([id number]) -------------------------------------------------------------------------------- Clinic Level of Care Assessment Details Patient Name: [name] Date of Service: 09/20/2019 8:15 AM Medical Record Number: [id number] Patient Account Number: [id number] Date of Birth/Sex: [birth year] (39 y.o. M) Treating RN: Army Melia Primary Care Quandre Polinski: [name] Other Clinician: Referring Charmane Protzman: [name] Treating Jb Dulworth/Extender: Melburn Hake, [name]:  145 Clinic Level of Care Assessment Items TOOL 4 Quantity Score []  - Use when only an EandM is performed on FOLLOW-UP visit 0 ASSESSMENTS - Nursing Assessment / Reassessment X - Reassessment of Co-morbidities (includes updates in patient status) 1 10 X- 1 5 Reassessment of Adherence to Treatment Plan ASSESSMENTS - Wound and Skin Assessment / Reassessment X - Simple Wound Assessment / Reassessment - one wound 1 5 []  - 0 Complex Wound Assessment / Reassessment - multiple wounds []  - 0 Dermatologic / Skin Assessment (not related to wound area) ASSESSMENTS - Focused Assessment []  - Circumferential Edema Measurements - multi extremities 0 []  - 0 Nutritional Assessment / Counseling / Intervention []  - 0 Lower Extremity Assessment (monofilament, tuning fork, pulses) []  - 0 Peripheral Arterial Disease Assessment (using hand held doppler) ASSESSMENTS - Ostomy and/or Continence Assessment and Care []  - Incontinence Assessment and Management 0 []  - 0 Ostomy Care Assessment and Management (repouching, etc.) PROCESS - Coordination of Care X - Simple Patient / Family Education for ongoing care 1 15 []  - 0 Complex (extensive) Patient / Family Education for ongoing care []  - 0 Staff obtains Programmer, systems, Records, Test Results / Process Orders []  - 0 Staff telephones HHA, Nursing Homes / Clarify orders / etc []  - 0 Routine Transfer to another Facility (non-emergent condition) []  - 0 Routine Hospital Admission (non-emergent condition) []  - 0 New Admissions / Biomedical engineer / Ordering NPWT, Apligraf, etc. []  - 0 Emergency Hospital Admission (emergent condition) X- 1 10 Simple Discharge Coordination []  - 0 Complex (extensive) Discharge Coordination PROCESS - Special Needs []  - Pediatric / Minor Patient Management 0 []  - 0 Isolation Patient Management []  - 0 Hearing / Language / Visual special needs []  - 0 Assessment of Community assistance (transportation, D/C planning,  etc.) []  - 0 Additional assistance / Altered mentation []  - 0 Support Surface(s) Assessment (bed, cushion, seat, etc.) INTERVENTIONS - Wound Cleansing / Measurement Terral, Min ([id number]) X- 1 5 Simple Wound Cleansing -  one wound []  - 0 Complex Wound Cleansing - multiple wounds X- 1 5 Wound Imaging (photographs - any number of wounds) []  - 0 Wound Tracing (instead of photographs) X- 1 5 Simple Wound Measurement - one wound []  - 0 Complex Wound Measurement - multiple wounds INTERVENTIONS - Wound Dressings []  - Small Wound Dressing one or multiple wounds 0 X- 1 15 Medium Wound Dressing one or multiple wounds []  - 0 Large Wound Dressing one or multiple wounds []  - 0 Application of Medications - topical []  - 0 Application of Medications - injection INTERVENTIONS - Miscellaneous []  - External ear exam 0 []  - 0 Specimen Collection (cultures, biopsies, blood, body fluids, etc.) []  - 0 Specimen(s) / Culture(s) sent or taken to Lab for analysis []  - 0 Patient Transfer (multiple staff / Civil Service fast streamer / Similar devices) []  - 0 Simple Staple / Suture removal (25 or less) []  - 0 Complex Staple / Suture removal (26 or more) []  - 0 Hypo / Hyperglycemic Management (close monitor of Blood Glucose) []  - 0 Ankle / Brachial Index (ABI) - do not check if billed separately X- 1 5 Vital Signs Has the patient been seen at the hospital within the last three years: Yes Total Score: 80 Level Of Care: New/Established - Level 3 Electronic Signature(s) Signed: 09/20/2019 4:19:51 PM By: Army Melia Entered By: Army Melia on 09/20/2019 08:57:30 [name] ([id number]) -------------------------------------------------------------------------------- Encounter Discharge Information Details Patient Name: [name] Date of Service: 09/20/2019 8:15 AM Medical Record Number: [id number] Patient Account Number: [id number] Date of Birth/Sex: Nov 07, 1980 (39 y.o. M) Treating RN: Army Melia Primary Care Jamoni Hewes: [name] Other Clinician: Referring Tomi Grandpre: [name] Treating Jessice Madill/Extender: Melburn Hake, [name]: 145 Encounter Discharge Information Items Discharge Condition: Stable Ambulatory Status: Ambulatory Discharge Destination: Home Transportation: Private Auto Accompanied By: self Schedule Follow-up Appointment: Yes Clinical Summary of Care: Electronic Signature(s) Signed: 09/20/2019 4:19:51 PM By: Army Melia Entered By: Army Melia on 09/20/2019 08:58:08 [name] ([id number]) -------------------------------------------------------------------------------- Lower Extremity Assessment Details Patient Name: [name] Date of Service: 09/20/2019 8:15 AM Medical Record Number: [id number] Patient Account Number: [id number] Date of Birth/Sex: 1981/03/01 (39 y.o. M) Treating RN: Montey Hora Primary Care Jatoya Armbrister: [name] Other Clinician: Referring Niquan Charnley: [name] Treating Baer Hinton/Extender: Melburn Hake, [name]: 145 Edema Assessment Assessed: [Left: No] [Right: No] Edema: [Left: Ye] [Right: s] Vascular Assessment Pulses: Dorsalis Pedis Palpable: [Left:Yes] Electronic Signature(s) Signed: 09/20/2019 4:26:56 PM By: Montey Hora Entered By: Montey Hora on 09/20/2019 08:26:51 [name] ([id number]) -------------------------------------------------------------------------------- Multi Wound Chart Details Patient Name: [name] Date of Service: 09/20/2019 8:15 AM Medical Record Number: [id number] Patient Account Number: [id number] Date of Birth/Sex: [birth year] (39 y.o. M) Treating RN: Army Melia Primary Care Alphia Behanna: [name] Other Clinician: Referring Daje Stark: [name] Treating Mahaley Schwering/Extender: Melburn Hake, [name]: 145 Vital Signs Height(in): 71 Pulse(bpm): 89 Weight(lbs): 338 Blood Pressure(mmHg): 115/72 Body Mass Index(BMI):  47 Temperature(F): 98.8 Respiratory Rate(breaths/min): 18 Photos: [N/A:N/A] Wound Location: Left, Lateral Lower Leg N/A N/A Wounding Event: Gradually Appeared N/A N/A Primary Etiology: Pyoderma N/A N/A Comorbid History: Sleep Apnea, Hypertension, Colitis N/A N/A Date Acquired: 11/18/2015 N/A N/A Weeks of Treatment: 145 N/A N/A Wound Status: Open N/A N/A Measurements L x W x D (cm) 6.9x7.7x1.3 N/A N/A Area (cm) : 41.728 N/A N/A Volume (cm) : 54.247 N/A N/A % Reduction in Area: -750.00% N/A N/A % Reduction in Volume: -1281.40% N/A N/A Classification: Full Thickness With Exposed N/A N/A Support Structures Exudate  Amount: Medium N/A N/A Exudate Type: Purulent N/A N/A Exudate Color: yellow, brown, green N/A N/A Wound Margin: Epibole N/A N/A Granulation Amount: Medium (34-66%) N/A N/A Granulation Quality: Pink N/A N/A Necrotic Amount: Medium (34-66%) N/A N/A Exposed Structures: Fat Layer (Subcutaneous Tissue) N/A N/A Exposed: Yes Muscle: Yes Fascia: No Tendon: No Joint: No Bone: No Epithelialization: None N/A N/A Treatment Notes Electronic Signature(s) Signed: 09/20/2019 4:19:51 PM By: Army Melia Entered By: Army Melia on 09/20/2019 08:55:42 [name], [name] ([id number]) -------------------------------------------------------------------------------- Multi-Disciplinary Care Plan Details Patient Name: [name] Date of Service: 09/20/2019 8:15 AM Medical Record Number: [id number] Patient Account Number: [id number] Date of Birth/Sex: 06/26/1980 (39 y.o. M) Treating RN: Army Melia Primary Care Meredeth Furber: [name] Other Clinician: Referring Jafari Mckillop: [name] Treating Uva Runkel/Extender: Melburn Hake, [name]: 145 Active Inactive Venous Leg Ulcer Nursing Diagnoses: Knowledge deficit related to disease process and management Potential for venous Insuffiency (use before diagnosis confirmed) Goals: Non-invasive venous studies are completed as  ordered Date Initiated: 12/11/2016 Target Resolution Date: 03/13/2017 Goal Status: Active Patient will maintain optimal edema control Date Initiated: 12/11/2016 Target Resolution Date: 03/13/2017 Goal Status: Active Patient/caregiver will verbalize understanding of disease process and disease management Date Initiated: 12/11/2016 Target Resolution Date: 03/13/2017 Goal Status: Active Verify adequate tissue perfusion prior to therapeutic compression application Date Initiated: 12/11/2016 Target Resolution Date: 03/13/2017 Goal Status: Active Interventions: Assess peripheral edema status every visit. Compression as ordered Provide education on venous insufficiency Treatment Activities: Therapeutic compression applied : 12/11/2016 Notes: Wound/Skin Impairment Nursing Diagnoses: Impaired tissue integrity Knowledge deficit related to ulceration/compromised skin integrity Goals: Patient/caregiver will verbalize understanding of skin care regimen Date Initiated: 12/11/2016 Target Resolution Date: 03/13/2017 Goal Status: Active Ulcer/skin breakdown will have a volume reduction of 30% by week 4 Date Initiated: 12/11/2016 Target Resolution Date: 03/13/2017 Goal Status: Active Ulcer/skin breakdown will have a volume reduction of 50% by week 8 Date Initiated: 12/11/2016 Target Resolution Date: 03/13/2017 Goal Status: Active Ulcer/skin breakdown will have a volume reduction of 80% by week 12 Date Initiated: 12/11/2016 Target Resolution Date: 03/13/2017 Goal Status: Active Ulcer/skin breakdown will heal within 14 weeks Date Initiated: 12/11/2016 Target Resolution Date: 03/13/2017 Goal Status: Active [name], [name] ([id number]) Interventions: Assess patient/caregiver ability to obtain necessary supplies Assess patient/caregiver ability to perform ulcer/skin care regimen upon admission and as needed Assess ulceration(s) every visit Provide education on ulcer and skin care Treatment  Activities: Skin care regimen initiated : 12/11/2016 Topical wound management initiated : 12/11/2016 Notes: Electronic Signature(s) Signed: 09/20/2019 4:19:51 PM By: Army Melia Entered By: Army Melia on 09/20/2019 08:55:14 [name] ([id number]) -------------------------------------------------------------------------------- Pain Assessment Details Patient Name: [name] Date of Service: 09/20/2019 8:15 AM Medical Record Number: [id number] Patient Account Number: [id number] Date of Birth/Sex: [birth year] (39 y.o. M) Treating RN: Montey Hora Primary Care Idali Lafever: [name] Other Clinician: Referring Tamanna Whitson: [name] Treating Jamilah Jean/Extender: Melburn Hake, [name]: 145 Active Problems Location of Pain Severity and Description of Pain Patient Has Paino No Site Locations Pain Management and Medication Current Pain Management: Electronic Signature(s) Signed: 09/20/2019 4:26:56 PM By: Montey Hora Entered By: Montey Hora on 09/20/2019 08:22:10 [name] ([id number]) -------------------------------------------------------------------------------- Patient/Caregiver Education Details Patient Name: [name] Date of Service: 09/20/2019 8:15 AM Medical Record Number: [id number] Patient Account Number: [id number] Date of Birth/Gender: Mar 15, 1981 (39 y.o. M) Treating RN: Army Melia Primary Care Physician: [name] Other Clinician: Referring Physician: [name] Treating Physician/Extender: Sharalyn Ink in Treatment: 145 Education Assessment Education Provided To: Patient Education Topics Provided  Wound/Skin Impairment: Handouts: Caring for Your Ulcer Methods: Demonstration, Explain/Verbal Responses: State content correctly Electronic Signature(s) Signed: 09/20/2019 4:19:51 PM By: Army Melia Entered By: Army Melia on 09/20/2019 08:57:42 [name], [name]  ([id number]) -------------------------------------------------------------------------------- Wound Assessment Details Patient Name: [name] Date of Service: 09/20/2019 8:15 AM Medical Record Number: [id number] Patient Account Number: [id number] Date of Birth/Sex: October 30, 1980 (39 y.o. M) Treating RN: Montey Hora Primary Care Shyam Dawson: [name] Other Clinician: Referring Yulanda Diggs: [name] Treating Eaden Hettinger/Extender: Melburn Hake, [name]: 145 Wound Status Wound Number: 1 Primary Etiology: Pyoderma Wound Location: Left, Lateral Lower Leg Wound Status: Open Wounding Event: Gradually Appeared Comorbid History: Sleep Apnea, Hypertension, Colitis Date Acquired: 11/18/2015 Weeks Of Treatment: 145 Clustered Wound: No Photos Wound Measurements Length: (cm) 6.9 Width: (cm) 7.7 Depth: (cm) 1.3 Area: (cm) 41.728 Volume: (cm) 54.247 % Reduction in Area: -750% % Reduction in Volume: -1281.4% Epithelialization: None Tunneling: No Undermining: No Wound Description Classification: Full Thickness With Exposed Support Structures Wound Margin: Epibole Exudate Amount: Medium Exudate Type: Purulent Exudate Color: yellow, brown, green Foul Odor After Cleansing: No Slough/Fibrino Yes Wound Bed Granulation Amount: Medium (34-66%) Exposed Structure Granulation Quality: Pink Fascia Exposed: No Necrotic Amount: Medium (34-66%) Fat Layer (Subcutaneous Tissue) Exposed: Yes Necrotic Quality: Adherent Slough Tendon Exposed: No Muscle Exposed: Yes Necrosis of Muscle: No Joint Exposed: No Bone Exposed: No Treatment Notes Wound #1 (Left, Lateral Lower Leg) Notes gentamycin, BFD [name], [name] ([id number]) Electronic Signature(s) Signed: 09/20/2019 4:26:56 PM By: Montey Hora Entered By: Montey Hora on 09/20/2019 08:26:37 [name] ([id number]) -------------------------------------------------------------------------------- Vitals Details Patient Name:  [name] Date of Service: 09/20/2019 8:15 AM Medical Record Number: [id number] Patient Account Number: [id number] Date of Birth/Sex: 03/08/1981 (39 y.o. M) Treating RN: Montey Hora Primary Care Enoc Getter: [name] Other Clinician: Referring Demika Langenderfer: [name] Treating Nalia Honeycutt/Extender: Melburn Hake, [name]: 145 Vital Signs Time Taken: 08:22 Temperature (F): 98.8 Height (in): 71 Pulse (bpm): 89 Weight (lbs): 338 Respiratory Rate (breaths/min): 18 Body Mass Index (BMI): 47.1 Blood Pressure (mmHg): 115/72 Reference Range: 80 - 120 mg / dl Electronic Signature(s) Signed: 09/20/2019 4:26:56 PM By: Montey Hora Entered By: Montey Hora on 09/20/2019 03:55:97

## 2019-09-20 NOTE — Progress Notes (Addendum)
[name], [name] ([id number]) Visit Report for 09/20/2019 Chief Complaint Document Details Patient Name: [name], [name] Date of Service: 09/20/2019 8:15 AM Medical Record Number: [id number] Patient Account Number: [id number] Date of Birth/Sex: [birth year] (39 y.o. 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 M) Treating RN: Army Melia Primary Care Provider: [name] Other Clinician: Referring Provider: [name] Treating Provider/Extender: Melburn Hake, Jossie Smoot Weeks in Treatment: 145 History of Present Illness HPI Description: 12/04/16; 39 year old man who comes into the clinic today for review of a wound on the posterior left calf. He tells me that is been there for about a year. He is not a diabetic he does smoke half a pack per day. He was seen in the ER on 11/20/16 felt to have cellulitis around the wound and was given clindamycin. An x-ray did not show osteomyelitis. The patient initially tells me that he has a milk allergy that sets off a pruritic itching rash on his lower legs which she scratches incessantly and he thinks that's what may have set up the wound. He has been using various topical antibiotics and ointments without any effect. He works in a [employer] and is on his feet all day. He does not have a  prior history of wounds however he does have the rash on both lower legs the right arm and the ventral aspect of his left arm. These are excoriations and clearly have had scratching however there are of macular looking areas on both legs including a substantial larger area on the right leg. This does not have an underlying open area. There is no blistering. The patient tells me that 2 years ago in [place] in response to the rash on his legs he saw a dermatologist who told him he had a condition which may be pyoderma gangrenosum although I may be putting words into his mouth. He seemed to recognize this. 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 He is not in any treatment for this. He's had no recent travel 12/11/16; the patient arrives today with his wound and roughly the same condition we've been using silver alginate this is a deep punched out wound with some surrounding erythema but no tenderness. Biopsy I did did not show confirmed pyoderma gangrenosum suggested nonspecific inflammation and vasculitis but does not provide an actual description of what was seen by the pathologist. I'm really not able to understand this We have also received information from the patient's dermatologist in [place] notes from April 2016. This was a doctor Agarwal-antal. 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 He was prescribed topical steroid high potency under occlusion which helped but at this point the patient did not have a deep punched out wound. 12/18/16; the patient's wound is larger in terms of surface area however this surface looks better and there is less depth. The surrounding erythema also is better. The patient states that the wrap we put on came off 2 days ago when he has been using his  compression stockings. He we are in the process of getting a dermatology consult. 12/26/16 on evaluation today patient's left lower extremity wound shows evidence of  infection with surrounding erythema noted. He has been tolerating the dressing changes but states that he has noted more discomfort. There is a larger area of erythema surrounding the wound. No fevers, chills, nausea, or vomiting noted at this time. With that being said the wound still does have slough covering the surface. He is not allergic to any medication that he is aware of at this point. In regard to his right lower extremity he had several regions that are erythematous and pruritic he wonders if there's anything we can do to help that. 01/02/17 I reviewed patient's wound culture which was obtained his visit last week. He was placed on doxycycline at that point. Unfortunately that does not appear to be an antibiotic that would likely help with the situation however the pseudomonas noted on culture is sensitive to Cipro. Also unfortunately patient's wound seems to have a large compared to last week's evaluation. Not severely so but there are definitely increased measurements in general. He is continuing to have discomfort as well he writes this to be a seven out of 10. In fact he would prefer me not to perform any debridement today due to the fact that he is having discomfort and considering he has an active infection on the little reluctant to do so anyway. No fevers, chills, nausea, or vomiting noted at this time. 01/08/17; patient seems dermatology on September 5. I suspect dermatology will want the slides from the biopsy I did sent to their pathologist. I'm not sure if there is a way we can expedite that. In any case the culture I did before I left on vacation 3 weeks ago showed Pseudomonas he was given 10 days of Cipro and per her description of her intake nurses is actually somewhat better this week although the wound is quite a bit bigger than I remember the last time I saw this. He still has 3 more days of Cipro 01/21/17; dermatology appointment tomorrow. He has completed the ciprofloxacin  for Pseudomonas. Surface of the wound looks better however he is had some deterioration in the lesions on his right leg. Meantime the left lateral leg wound we will continue with sample 01/29/17; patient had his dermatology appointment but I can't yet see that note. He is completed his antibiotics. The wound is more superficial but considerably larger in circumferential area than when he came in. This is in his left lateral calf. He also has swollen erythematous areas with superficial wounds on the right leg and small papular areas on both arms. There apparently areas in her his upper thighs and buttocks I did not look at those. Dermatology biopsied the right leg. Hopefully will have their input next week. 02/05/17; patient went back to see his dermatologist who told him that he had a "scratching problem" as well as staph. He is now on a 30 day course of doxycycline and I believe she gave him triamcinolone cream to the right leg areas to help with the itching [not exactly sure but probably triamcinolone]. She apparently looked at the left lateral leg wound although this was not rebiopsied and I think felt to be ultimately part of the same pathogenesis. He is using sample border foam and changing nevus himself. He now has a new open area on the right posterior leg which was his biopsy site I don't have any of the  dermatology notes 02/12/17; we put the patient in compression last week with SANTYL to the wound on the left leg and the biopsy. Edema is much better and the depth of the wound is now at level of skin. Area is still the same oBiopsy site on the right lateral leg we've also been using santyl with a border foam dressing and he is changing this himself. 02/19/17; Using silver alginate started last week to both the substantial left leg wound and the biopsy site on the right wound. He is tolerating compression well. Has a an appointment with his primary M.D. tomorrow wondering about diuretics although  I'm wondering if the edema problem is actually lymphedema 02/26/17; the patient has been to see his primary doctor Dr. Jerrel Ivory at [place] our primary care. She started him on Lasix 20 mg and this seems to have helped with the edema. However we are not making substantial change with the left lateral calf wound and inflammation. The biopsy site on the right leg also looks stable but not really all that different. 03/12/17; the patient has been to see vein and vascular Dr. Lucky Cowboy. He has had venous reflux studies I have not reviewed these. I did get a call from his dermatology office. They felt that he might have pathergy based on their biopsy on his right leg which led them to look at the slides of [name], [name] ([id number]) the biopsy I did on the left leg and they wonder whether this represents pyoderma gangrenosum which was the original supposition in a man with ulcerative colitis albeit inactive for many years. They therefore recommended clobetasol and tetracycline i.e. 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 03/26/17; apparently the patient just had reflux studies not an appointment with Dr. dew. She arrives in clinic today having applied clobetasol for 2-3 weeks. He notes over the last 2-3 days excessive drainage having to change the dressing 3-4 times a day and also expanding erythema. 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 Lillia Carmel at [facility] dermatology locally who kindly saw him at my request. 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 My concern was the degree of erythema medially extending from his left leg wound which was either inflammation from pyoderma  or cellulitis. I put him on Augmentin however culture of the wound showed Pseudomonas which is quinolone sensitive. I really don't believe he has cellulitis however in view of everything I will continue and give him a course of Cipro. He is also on doxycycline as an immune modulator for the pyoderma. In addition to his original wound on the left lateral leg with surrounding erythema he has a wound on the right posterior calf which was an original biopsy site done by dermatology. 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 Saw Dr. Lillia Carmel yesterday. He has been using topical antibiotics to both wound areas his original wound on the left and the biopsies/pathergy area on the right. There is definitely some improvement in the inflammation around the wound on the right although the patient states he has increasing sensitivity of the wounds. He is on prednisone 60 and doxycycline 1 as prescribed by Dr. Lillia Carmel. He is covering the topical antibiotic  with gauze and putting this in his own compression stocks and changing this daily. He states that Dr. Lottie Rater did a culture of the left leg wound yesterday 05/07/17; pyoderma gangrenosum. The patient saw Dr. Lillia Carmel yesterday and has a follow-up with her in one month. He is still using topical antibiotics to both wounds although he can't recall exactly what type. He is still on prednisone 60 mg. Dr. Lillia Carmel stated that the doxycycline could stop if we were in agreement. 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 The right medial leg was induced by biopsy/pathergy. The area on the right is essentially healed. 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 The right medial leg has closed and remains closed. He  is still on prednisone 60. oHe tells me he missed his last dermatology appointment with Dr. Lillia Carmel but will make another appointment. He reports that her blood sugar at a recent screen in [place] was high 200's. He was 180 today. 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 I think he is going to require still much longer prednisone perhaps another 3 months before attempting to taper. In the meantime his wound is a lot better. Smaller. He is cleaning this off daily and applying topical antibiotics. When he was last in the clinic I thought about changing to [facility] and actually put in a couple of calls to dermatology although probably not during their business hours. 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 He continues on oral prednisone. He has been using triple antibiotic ointment. There is surface debris and we will transition to [facility] and have him return in 2 weeks. He has lost 30 pounds since his last appointment with lifestyle modification. He may benefit from topical steroid cream for treatment this can be considered at a later date. 08/22/17 on evaluation today patient appears to actually be doing rather well in regard to his left lateral lower extremity ulcer. He has actually been managed by Dr. Dellia Nims most recently. Patient is currently on oral steroids at this time. This seems to have been of benefit for him. Nonetheless his last visit was actually with Leah on 08/06/17. Currently he is not utilizing any topical steroid creams although this could be of benefit as well. No fevers, chills, nausea, or vomiting noted at this time. 09/05/17 on evaluation today patient appears to be doing better in regard to his left lateral lower extremity ulcer. He has been tolerating the dressing changes without complication. He is using Santyl with good effect. Overall I'm  very pleased with how things are standing at this point. Patient likewise is happy that this is doing better. 09/19/17 on evaluation today patient actually appears to be doing rather well in regard to his left lateral lower extremity ulcer. 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 He's not having any significant pain. 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 He has been tolerating the Santyl without complication and in general I feel like he's making good progress. 10/17/17 on evaluation  today patient appears to be doing very well in regard to his left lateral lower surety ulcer. He has been tolerating the dressing changes without complication. There does not appear to be any evidence of infection he's alternating the Santyl and the triple antibiotic ointment every other day this seems to be doing well for him. 11/03/17 on evaluation today patient appears to be doing very well in regard to his left lateral lower extremity ulcer. He is been tolerating the dressing changes without complication which is good news. Fortunately there does not appear to be any evidence of infection which is also great news. Overall is doing excellent they are starting to taper down on the prednisone is down to 40 mg at this point it also started topical clobetasol for him. 11/17/17 on evaluation today patient appears to be doing well in regard to his left lateral lower surety ulcer. He's been tolerating the dressing changes without complication. He does note that he is having no pain, no excessive drainage or discharge, and overall he feels like things are going about how he would expect and hope they would. Overall he seems to have no evidence of infection at this time in my opinion which is good news. 12/04/17-He is seen in follow-up  evaluation for right lateral lower extremity ulcer. He has been applying topical steroid cream. Today's measurement show slight increase in size. Over the next 2 weeks we will transition to every other day Santyl and steroid cream. He has been encouraged to monitor for changes and notify clinic with any concerns 12/15/17 on evaluation today patient's left lateral motion the ulcer and fortunately is doing worse again at this point. This just since last week to this week has close to doubled in size according to the patient. I did not seeing last week's I do not have a visual to compare this to in our system was also down so we do not have all the charts and at this point. Nonetheless it does have me somewhat concerned in regard to the fact that again he was worried enough about it he has contact the dermatology that placed them back on the full strength, 50 mg a day of the prednisone that he was taken previous. He continues to alternate using clobetasol along with Santyl at this point. He is obviously somewhat frustrated. 12/22/17 on evaluation today patient appears to be doing a little worse compared to last evaluation. Unfortunately the wound is a little deeper and slightly larger than the last week's evaluation. With that being said he has made some progress in regard to the irritation surrounding at this time unfortunately despite that progress that's been made he still has a significant issue going on here. 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 [name], [name] ([id number]) For that reason I discussed with him today the possibility of perform a wound culture to ensure there's nothing overtly infected. 01/06/18 on evaluation today patient's wound is larger and deeper than previously evaluated. With that being said it did appear that his wound was  infected after my last evaluation with him. Subsequently I did end up prescribing a prescription for Bactrim DS which she has been taking and having no complication with. Fortunately there does not appear to be any evidence of infection at this point in time as far as anything spreading, no want to touch, and overall I  feel like things are showing signs of improvement. 01/13/18 on evaluation today patient appears to be even a little larger and deeper than last time. There still muscle exposed in the base of the wound. Nonetheless he does appear to be less erythematous I do believe inflammation is calming down also believe the infection looks like it's probably resolved at this time based on what I'm seeing. No fevers, chills, nausea, or vomiting noted at this time. 01/30/18 on evaluation today patient actually appears to visually look better for the most part. Unfortunately those visually this looks better he does seem to potentially have what may be an abscess in the muscle that has been noted in the central portion of the wound. This is the first time that I have noted what appears to be fluctuance in the central portion of the muscle. With that being said I'm somewhat more concerned about the fact that this might indicate an abscess formation at this location. I do believe that an ultrasound would be appropriate. This is likely something we need to try to do as soon as possible. He has been switch to mupirocin ointment and he is no longer using the steroid ointment as prescribed by dermatology he sees them again next week he's been decreased from 60 to 40 mg of prednisone. 03/09/18 on evaluation today patient actually appears to be doing a little better compared to last time I saw him. There's not as much erythema surrounding the wound itself. He I did review his most recent infectious disease note which was dated 02/24/18. He saw Dr. Michel Bickers in [place]. 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 Patient is now off of antibiotics and they are continually observing this. There seems to be no change in the past two weeks in my pinion based on what the patient says and what I see today compared to what Dr. Megan Salon likely saw two weeks ago. No fevers, chills, nausea, or vomiting noted at this time. 03/23/18 on evaluation today patient's wound actually appears to be showing signs of improvement which is good news. He is currently still on the Dapsone. He is also working on tapering the prednisone to get off of this and Dr. Lottie Rater is working with him in this regard. Nonetheless overall I feel like the wound is doing well it does appear based on the infectious disease note that I reviewed from Dr. 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 I did also review the results of his ultrasound of left lower extremity which revealed there was a dentist tissue in the base of the wound without an abscess noted. 04/06/18 on evaluation today the patient's left lateral lower extremity ulcer actually appears to be doing fairly well which is excellent news. There does not appear to be any evidence of infection at this time which is also great news. Overall he still does have a significantly large ulceration although little by little he seems to be making progress. He is down to 10 mg a day of the prednisone. 04/20/18 on evaluation today patient actually appears to be doing excellent at this time in regard to his left lower extremity ulcer. He's making signs of good progress unfortunately this is taking much longer than we would really like to see but nonetheless  he is making progress. Fortunately there does not appear to be any evidence of infection at this time. No fevers, chills, nausea, or vomiting noted at  this time. The patient has not been using the Santyl due to the cost he hadn't got in this field yet. He's mainly been using the antibiotic ointment topically. Subsequently he also tells me that he really has not been scrubbing in the shower I think this would be helpful again as I told him it doesn't have to be anything too aggressive to even make it believe just enough to keep it free of some of the loose slough and biofilm on the wound surface. 05/11/18 on evaluation today patient's wound appears to be making slow but sure progress in regard to the left lateral lower extremity ulcer. He is been tolerating the dressing changes without complication. Fortunately there does not appear to be any evidence of infection at this time. He is still just using triple antibiotic ointment along with clobetasol occasionally over the area. He never got the Santyl and really does not seem to intend to in my pinion. 06/01/18 on evaluation today patient appears to be doing a little better in regard to his left lateral lower extremity ulcer. He states that overall he does not feel like he is doing as well with the Dapsone as he did with the prednisone. Nonetheless he sees his dermatologist later today and is gonna talk to them about the possibility of going back on the prednisone. Overall again I believe that the wound would be better if you would utilize Santyl but he really does not seem to be interested in going back to the [place] at this point. He has been using triple antibiotic ointment. 06/15/18 on evaluation today patient's wound actually appears to be doing about the same at this point. Fortunately there is no signs of infection at this time. He has made slight improvements although he continues to not really want to clean the wound bed at this point. He states that he just doesn't mess with it he doesn't want to cause any problems with everything else he has going on. He has been on medication, antibiotics  as prescribed by his dermatologist, for a staff infection of his lower extremities which is really drying out now and looking much better he tells me. Fortunately there is no sign of overall infection. 06/29/18 on evaluation today patient appears to be doing well in regard to his left lateral lower surety ulcer all things considering. Fortunately his staff infection seems to be greatly improved compared to previous. He has no signs of infection and this is drying up quite nicely. He is still the doxycycline for this is no longer on cental, Dapsone, or any of the other medications. His dermatologist has recommended possibility of an infusion but right now he does not want to proceed with that. 07/13/18 on evaluation today patient appears to be doing about the same in regard to his left lateral lower surety ulcer. Fortunately there's no signs of infection at this time which is great news. Unfortunately he still builds up a significant amount of Slough/biofilm of the surface of the wound he still is not really cleaning this as he should be appropriately. Again I'm able to easily with saline and gauze remove the majority of this on the surface which if you would do this at home would likely be a dramatic improvement for him as far as getting the area to improve. Nonetheless overall I still feel  like he is making progress is just very slow. I think Santyl will be of benefit for him as well. Still he has not gotten this as of this point. 07/27/18 on evaluation today patient actually appears to be doing little worse in regards of the erythema around the periwound region of the wound he also tells me that he's been having more drainage currently compared to what he was experiencing last time I saw him. He states not quite as bad as what he had because this was infected previously but nonetheless is still appears to be doing poorly. Fortunately there is no evidence of systemic infection at this point. The patient  tells me that he is not going to be able to afford the Santyl. He is still waiting to hear about the infusion therapy with his dermatologist. Apparently she wants an updated colonoscopy first. 08/10/18 on evaluation today patient appears to be doing better in regard to his left lateral lower extremity ulcer. Fortunately he is showing signs of improvement in this regard he's actually been approved for Remicade infusion's as well although this has not been scheduled as of yet. Fortunately there's no signs of active infection at this time in regard to the wound although he is having some issues with infection of the right lower extremity is been seen as dermatologist for this. Fortunately they are definitely still working with him trying to keep things under control. [name], [name] ([id number]) 09/07/18 on evaluation today patient is actually doing rather well in regard to his left lateral lower extremity ulcer. He notes these actually having some hair grow back on his extremity which is something he has not seen in years. He also tells me that the pain is really not giving them any trouble at this time which is also good news overall she is very pleased with the progress he's using a combination of the mupirocin along with the probate is all mixed. 09/21/18 on evaluation today patient actually appears to be doing fairly well all things considered in regard to his looks from the ulcer. He's been tolerating the dressing changes without complication. Fortunately there's no signs of active infection at this time which is good news he is still on all antibiotics or prevention of the staff infection. He has been on prednisone for time although he states it is gonna contact his dermatologist and see if she put them on a short course due to some irritation that he has going on currently. Fortunately there's no evidence of any overall worsening this is going very slow I think cental would be something that would be  helpful for him although he states that $50 for tube is quite expensive. He therefore is not willing to get that at this point. 10/06/18 on evaluation today patient actually appears to be doing decently well in regard to his left lateral leg ulcer. He's been tolerating the dressing changes without complication. Fortunately there's no signs of active infection at this time. Overall I'm actually rather pleased with the progress he's making although it's slow he doesn't show any signs of infection and he does seem to be making some improvement. I do believe that he may need a switch up and dressings to try to help this to heal more appropriately and quickly. 10/19/18 on evaluation today patient actually appears to be doing better in regard to his left lateral lower extremity ulcer. This is shown signs of having much less Slough buildup at this point due to the fact he has  been using the Santyl. Obviously this is very good news. The overall size of the wound is not dramatically smaller but again the appearance is. 11/02/18 on evaluation today patient actually appears to be doing quite well in regard to his lower [place] ulcer. A lot of the skin around the ulcer is actually somewhat irritating at this point this seems to be more due to the dressing causing irritation from the adhesive that anything else. Fortunately there is no signs of active infection at this time. 11/24/18 on evaluation today patient appears to be doing a little worse in regard to his overall appearance of his lower extremity ulcer. There's more erythema and warmth around the wound unfortunately. He is currently on doxycycline which he has been on for some time. With that being said I'm not sure that seems to be helping with what appears to possibly be an acute cellulitis with regard to his left lower extremity ulcer. No fevers, chills, nausea, or vomiting noted at this time. 12/08/18 on evaluation today patient's wounds actually appears to  be doing significantly better compared to his last evaluation. He has been using Santyl along with alternating tripling about appointment as well as the steroid cream seems to be doing quite well and the wound is showing signs of improvement which is excellent news. Fortunately there's no evidence of infection and in fact his culture came back negative with only normal skin flora noted. 12/21/2018 upon evaluation today patient actually appears to be doing excellent with regard to his ulcer. This is actually the best that I have seen it since have been helping to take care of him. It is both smaller as well as less slough noted on the surface of the wound and seems to be showing signs of good improvement with new skin growing from the edges. He has been using just the triamcinolone he does wonder if he can get a refill of that ointment today. 01/04/2019 upon evaluation today patient actually appears to be doing well with regard to his left lateral lower extremity ulcer. With that being said it does not appear to be that he is doing quite as well as last time as far as progression is concerned. There does not appear to be any signs of infection or significant irritation which is good news. With that being said I do believe that he may benefit from switching to a collagen based dressing based on how clean The wound appears. 01/18/2019 on evaluation today patient actually appears to be doing well with regard to his wound on the left lower extremity. He is not made a lot of progress compared to where we were previous but nonetheless does seem to be doing okay at this time which is good news. There is no signs of active infection which is also good news. My only concern currently is I do wish we can get him into utilizing the collagen dressing his insurance would not pay for the supplies that we ordered although it appears that he may be able to order this through his supply company that he typically utilizes.  This is [employer]. Nonetheless he did try to order it during the office visit today and it appears this did go through. We will see if he can get that it is a different brand but nonetheless he has collagen and I do think will be beneficial. 02/01/2019 on evaluation today patient actually appears to be doing a little worse today in regard to the overall size of his wounds.  Fortunately there is no signs of active infection at this time. That is visually. Nonetheless when this is happened before it was due to infection. For that reason were somewhat concerned about that this time as well. 02/08/2019 on evaluation today patient unfortunately appears to be doing slightly worse with regard to his wound upon evaluation today. Is measuring a little deeper and a little larger unfortunately. I am not really sure exactly what is causing this to enlarge he actually did see his dermatologist she is going to see about initiating Humira for him. Subsequently she also did do steroid injections into the wound itself in the periphery. Nonetheless still nonetheless he seems to be getting a little bit larger he is gone back to just using the steroid cream topically which I think is appropriate. I would say hold off on the collagen for the time being is definitely a good thing to do. Based on the culture results which we finally did get the final result back regarding it shows staph as the bacteria noted again that can be a normal skin bacteria based on the fact however he is having increased drainage and worsening of the wound measurement wise I would go ahead and place him on an antibiotic today I do believe for this. 02/15/2019 on evaluation today patient actually appears to be doing somewhat better in regard to his ulcer. There is no signs of worsening at this time I did review his culture results which showed evidence of Staphylococcus aureus but not MRSA. Again this could just be more related to the normal skin  bacteria although he states the drainage has slowed down quite a bit he may have had a mild infection not just colonization. And was much smaller and then since around10/04/2019 on evaluation today patient appears to be doing unfortunately worse as far as the size of the wound. I really feel like that this is steadily getting larger again it had been doing excellent right at the beginning of September we have seen a steady increase in the area of the wound it is almost 2-1/2 times the size it was on September 1. Obviously this is a bad trend this is not wanting to see. For that reason we went back to using just the topical triamcinolone cream which does seem to help with inflammation. I checked him for bacteria by way of culture and nothing showed positive there. I am considering giving him a short course of a tapering steroid Dosepak today to see if that is can be beneficial for him. The patient is in agreement with giving that a try. 03/08/2019 on evaluation today patient appears to be doing very well in comparison to last evaluation with regard to his lower extremity ulcer. This is showing signs of less inflammation and actually measuring slightly smaller compared to last time every other week over the past month and a half he has been measuring larger larger larger. Nonetheless I do believe that the issue has been inflammation the prednisone does seem to [facility] ([id number]) have been beneficial for him which is good news. No fevers, chills, nausea, vomiting, or diarrhea. 03/22/2019 on evaluation today patient appears to be doing about the same with regard to his leg ulcer. He has been tolerating the dressing changes without complication. With that being said the wound seems to be mostly arrested at its current size but really is not making any progress except for when we prescribed the prednisone. He did show some signs of dropping as  far as the overall size of the wound during that interval  week. Nonetheless this is something he is not on long-term at this point and unfortunately I think he is getting need either this or else the Humira which his dermatologist has discussed try to get approval for. With that being said he will be seeing his dermatologist on the 11th of this month that is November. 04/19/2019 on evaluation today patient appears to be doing really about the same the wound is measuring slightly larger compared to last time I saw him. He has not been into the office since November 2 due to the fact that he unfortunately had Covid as that his entire family. He tells me that it was rough but they did pull-through and he seems to be doing much better. Fortunately there is no signs of active infection at this time. No fevers, chills, nausea, vomiting, or diarrhea. 05/10/2019 on evaluation today patient unfortunately appears to be doing significantly worse as compared to last time I saw him. He does tell me that he has had his first dose of Humira and actually is scheduled to get the next one in the upcoming week. With that being said he tells me also that in the past several days he has been having a lot of issues with green drainage she showed me a picture this is more blue-green in color. He is also been having issues with increased sloughy buildup and the wound does appear to be larger today. Obviously this is not the direction that we want everything to take based on the starting of his Humira. Nonetheless I think this is definitely a result of likely infection and to be honest I think this is probably Pseudomonas causing the infection based on what I am seeing. 05/24/2019 on evaluation today patient unfortunately appears to be doing significantly worse compared to his prior evaluation with me 2 weeks ago. I did review his culture results which showed that he does have Staph aureus as well as Pseudomonas noted on the culture. Nonetheless the Levaquin that I prescribed for him  does not appear to have been appropriate and in fact he tells me he is no longer experiencing the green drainage and discharge that he had at the last visit. Fortunately there is no signs of active infection at this time which is good news although the wound has significantly worsened it in fact is much deeper than it was previous. We have been utilizing up to this point triamcinolone ointment as the prescription topical of choice but at this time I really feel like that the wound is getting need to be packed in order to appropriately manage this due to the deeper nature of the wound. Therefore something along the lines of an alginate dressing may be more appropriate. 05/31/2019 upon inspection today patient's wound actually showed signs of doing poorly at this point. Unfortunately he just does not seem to be making any good progress despite what we have tried. He actually did go ahead and pick up the Cipro and start taking that as he was noticing more green drainage he had previously completed the Levaquin that I prescribed for him as well. Nonetheless he missed his appointment for the seventh last week on Wednesday with the wound care center and [facility] where his dermatologist referred him. Obviously I do think a second opinion would be helpful at this point especially in light of the fact that the patient seems to be doing so poorly despite  the fact that we have tried everything that I really know how at this point. The only thing that ever seems to have helped him in the past is when he was on high doses of continual steroids that did seem to make a difference for him. 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 06/07/2019 upon evaluation today patient unfortunately appears to be doing worse yet again with regard to his wound. In fact I  am starting to question whether or not he may have a fluid pocket in the muscle at this point based on the bulging and the soft appearance to the central portion of the muscle area. There is not anything draining from the muscle itself at this time which is good news but nonetheless the wound is expanding. I am not really seeing any results of the Humira as far as overall wound progression based on what I am seeing at this point. The patient has been referred for second opinion with regard to his wound to the [facility] wound care center by his dermatologist which I definitely am not in opposition to. Unfortunately we tried multiple dressings in the past including collagen, alginate, and at one point even Hydrofera Blue. With that being said he is never really used it for any significant amount of time due to the fact that he often complains of pain associated with these dressings and then will go back to either using the Santyl which she has done intermittently or more frequently the triamcinolone. He is also using his own compression stockings. We have wrapped him in the past but again that was something else that he really was not a big fan of. 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 He seemed to do much better when he was on the steroids although honestly I understand that the steroids are not necessarily the best medication to be on long-term obviously 06/14/2019 on evaluation today patient appears to be doing actually a little bit better with regard to the overall appearance with his leg. Unfortunately he does continue to have issues with what appears to be some fluid underneath  the muscle although he did see the wound specialty center at [facility] last week their main goals were to see about infusion therapy in place of the Humira as they feel like that is not quite strong enough. They also recommended that we continue with the treatment otherwise as we are they felt like that was appropriate and they are okay with him continuing to follow-up here with [place] in that regard. With that being said they are also sending him to the vein specialist there to see about vein stripping and if that would be of benefit for him. Subsequently they also did not really address whether or not an ultrasound of the muscle area to see if there is anything that needs to be addressed here would be appropriate or not. For that reason I discussed this with him last week I  think we may proceed down that road at this point. 06/21/2019 upon evaluation today patient's wound actually appears to be doing slightly better compared to previous evaluations. I do believe that he has made a difference with regard to the progression here with the use of oral steroids. Again in the past has been the only thing that is really calm things down. He does tell me that from [facility] is gotten a good news from there that there are no further vein stripping that is necessary at this point. I do not have that available for review today although the patient did relay this to me. He also did obtain and have the ultrasound of the wound completed which I did sign off on today. It does appear that there is no fluid collection under the muscle this is likely then just edematous tissue in general. That is also good news. Overall I still believe the inflammation is the main issue here. He did inquire about the possibility of a wound VAC again with the muscle protruding like it is I am not really sure whether the wound VAC is necessarily ideal or not. That is something we will have to consider although I do believe he may need compression  wrapping to try to help with edema control which could potentially be of benefit. 06/28/2019 on evaluation today patient appears to be doing slightly better measurement wise although this is not terribly smaller he least seems to be trending towards that direction. With that being said he still seems to have purulent drainage noted in the wound bed at this time. He has been on Levaquin followed by Cipro over the past month. Unfortunately he still seems to have some issues with active infection at this time. I did perform a culture last week in order to evaluate and see if indeed there was still anything going on. Subsequently the culture did come back showing Pseudomonas which is consistent with the drainage has been having which is blue-green in color. He also has had an odor that again was somewhat consistent with Pseudomonas as well. Long story short it appears that the culture showed an intermediate finding with regard to how well the Cipro will work for the Pseudomonas infection. Subsequently being that he does not seem to be clearing up and at best what we are doing is just keeping this at [place] I think he may need to see infectious disease to discuss IV antibiotic options. NAJIB, COLMENARES ([id number]) 07/05/2019 upon evaluation today patient appears to be doing okay in regard to his leg ulcer. He has been tolerating the dressing changes at this point without complication. Fortunately there is no signs of active infection at this time which is good news. No fevers, chills, nausea, vomiting, or diarrhea. With that being said he does have an appointment with infectious disease tomorrow and his primary care on Wednesday. Again the reason for the infectious disease referral was due to the fact that he did not seem to be fully resolving with the use of oral antibiotics and therefore we were thinking that IV antibiotic therapy may be necessary secondary to the fact that there was an intermediate finding for  how effective the Cipro may be. Nonetheless again he has been having a lot of purulent and even green drainage. Fortunately right now that seems to have calmed down over the past week with the reinitiation of the oral antibiotic. Nonetheless we will see what Dr. Megan Salon has to say. 07/12/2019 upon evaluation today patient appears  to be doing about the same at this point in regard to his left lower extremity ulcer. Fortunately there is no signs of active infection at this time which is good news I do believe the Levaquin has been beneficial I did review Dr. Hale Bogus note and to be honest I agree that the patient's leg does appear to be doing better currently. What we found in the past as he does not seem to really completely resolve he will stop the antibiotic and then subsequently things will revert back to having issues with blue-green drainage, increased pain, and overall worsening in general. Obviously that is the reason I sent him back to infectious disease. 07/19/2019 upon evaluation today patient appears to be doing roughly the same in size there is really no dramatic improvement. He has started back on the Levaquin at this point and though he seems to be doing okay he did still have a lot of blue/green drainage noted on evaluation today unfortunately. I think that this is still indicative more likely of a Pseudomonas infection as previously noted and again he does see Dr. Megan Salon in just a couple of days. I do not know that were really able to effectively clear this with just oral antibiotics alone based on what I am seeing currently. Nonetheless we are still continue to try to manage as best we can with regard to the patient and his wound. I do think the wrap was helpful in decreasing the edema which is excellent news. No fevers, chills, nausea, vomiting, or diarrhea. 07/26/2019 upon evaluation today patient appears to be doing slightly better with regard to the overall appearance of the muscle  there is no dark discoloration centrally. Fortunately there is no signs of active infection at this time. No fevers, chills, nausea, vomiting, or diarrhea. Patient's wound bed currently the patient did have an appointment with Dr. Megan Salon at infectious disease last week. With that being said Dr. Megan Salon the patient states was still somewhat hesitant about put him on any IV antibiotics he wanted [place] to repeat cultures today and then see where things go going forward. He does look like Dr. Megan Salon because of some improvement the patient did have with the Levaquin wanted [place] to see about repeating cultures. If it indeed grows the Pseudomonas again then he recommended a possibility of considering a PICC line placement and IV antibiotic therapy. He plans to see the patient back in 1 to 2 weeks. 08/02/2019 upon evaluation today patient appears to be doing poorly with regard to his left lower extremity. We did get the results of his culture back it shows that he is still showing evidence of Pseudomonas which is consistent with the purulent/blue-green drainage that he has currently. Subsequently the culture also shows that he now is showing resistance to the oral fluoroquinolones which is unfortunate as that was really the only thing to treat the infection prior. I do believe that he is looking like this is going require IV antibiotic therapy to get this under control. Fortunately there is no signs of systemic infection at this time which is good news. The patient does see Dr. Megan Salon tomorrow. 08/09/2019 upon evaluation today patient appears to be doing better with regard to his left lower extremity ulcer in regard to the overall appearance. He is currently on IV antibiotic therapy. As ordered by Dr. Megan Salon. Currently the patient is on ceftazidime which she is going to take for the next 2 weeks and then follow-up for 4 to 5-week appointment with  Dr. Megan Salon. The patient started this this past Friday  symptoms have not for a total of 3 days currently in full. 08/16/2019 upon evaluation today patient's wound actually does show muscle in the base of the wound but in general does appear to be much better as far as the overall evidence of infection is concerned. In fact I feel like this is for the most part cleared up he still on the IV antibiotics he has not completed the full course yet but I think he is doing much better which is excellent news. 08/23/2019 upon evaluation today patient appears to be doing about the same with regard to his wound at this point. He tells me that he still has pain unfortunately. Fortunately there is no evidence of systemic infection at this time which is great news. There is significant muscle protrusion. 09/13/19 upon evaluation today patient appears to be doing about the same in regard to his leg unfortunately. He still has a lot of drainage coming from the ulceration there is still muscle exposed. With that being said the patient's last wound culture still showed an intermediate finding with regard to the Pseudomonas he still having the bluish/green drainage as well. Overall I do not know that the wound has completely cleared of infection at this point. Fortunately there is no signs of active infection systemically at this point which is good news. 09/20/2019 upon evaluation today patient's wound actually appears to be doing about the same based on what I am seeing currently. I do not see any signs of systemic infection he still does have evidence of some local infection and drainage. He did see Dr. Megan Salon last week and Dr. Megan Salon states that he probably does need a different IV antibiotic although he does not want to put him on this until the patient begins the Remicade infusion which is actually scheduled for about 10 days out from today on 13 May. Following that time Dr. Megan Salon is good to see him back and then will evaluate the feasibility of starting him on the  IV antibiotic therapy once again at that point. I do not disagree with this plan I do believe as Dr. Megan Salon stated in his note that I reviewed today that the patient's issue is multifactorial with the pyoderma being 1 aspect of this that were hoping the Remicade will be helpful for her. In the meantime I think the gentamicin is, helping to keep things under decent okay control in regard to the ulcer. Electronic Signature(s) Signed: 09/20/2019 2:52:17 PM By: Worthy Keeler PA-C Entered By: Worthy Keeler on 09/20/2019 14:52:17 [name], [name] ([id number]) -------------------------------------------------------------------------------- Physical Exam Details Patient Name: [name] Date of Service: 09/20/2019 8:15 AM Medical Record Number: [id number] Patient Account Number: [id number] Date of Birth/Sex: Apr 25, 1981 (39 y.o. M) Treating RN: Army Melia Primary Care Provider: [name] Other Clinician: Referring Provider: [name] Treating Provider/Extender: Melburn Hake, Abrian Hanover Weeks in Treatment: 145 Constitutional Well-nourished and well-hydrated in no acute distress. Respiratory normal breathing without difficulty. Psychiatric this patient is able to make decisions and demonstrates good insight into disease process. Alert and Oriented x 3. pleasant and cooperative. Notes Upon inspection patient's wound did still show evidence of significant muscle exposure at this point there is some granulation noted in the ventral side of the wound which is good news. There is no evidence of systemic infection at this point which is good news. Even with the local infection I think the gentamicin cream is probably still the best option  for him at this time. At least until he can get the PICC line replaced and begin on the Remicade as well as the IV antibiotic therapy. Electronic Signature(s) Signed: 09/20/2019 2:52:55 PM By: Worthy Keeler PA-C Entered By: Worthy Keeler on 09/20/2019  14:52:54 [name], [name] ([id number]) -------------------------------------------------------------------------------- Physician Orders Details Patient Name: [name] Date of Service: 09/20/2019 8:15 AM Medical Record Number: [id number] Patient Account Number: [id number] Date of Birth/Sex: 1980/09/03 (39 y.o. 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 Anesthetic (add to Medication List) Wound #1 Left,Lateral Lower Leg o Topical Lidocaine 4% cream applied to wound bed prior to debridement (In Clinic Only). Primary Wound Dressing o Gentamicin Sulfate Cream Secondary Dressing Wound #1 Left,Lateral Lower Leg o Boardered Foam Dressing Dressing Change Frequency Wound #1 Left,Lateral Lower Leg o Change dressing every day. Follow-up Appointments Wound #1 Left,Lateral Lower Leg o Return Appointment in 1 week. Edema Control Wound #1 Left,Lateral Lower Leg o Elevate legs to the level of the heart and pump ankles as often as possible Electronic Signature(s) Signed: 09/20/2019 4:19:51 PM By: Army Melia Signed: 09/20/2019 5:05:18 PM By: Worthy Keeler PA-C Entered By: Army Melia on 09/20/2019 08:57:12 [name], [name] ([id number]) -------------------------------------------------------------------------------- Problem List Details Patient Name: [name] Date of Service: 09/20/2019 8:15 AM Medical Record Number: [id number] Patient Account Number: [id number] Date of Birth/Sex: Feb 10, 1981 (39  y.o. M) Treating RN: Army Melia Primary Care Provider: [name] Other Clinician: Referring Provider: [name] Treating Provider/Extender: Melburn Hake, Undra Trembath Weeks in Treatment: 145 Active Problems ICD-10 Encounter Code Description Active Date MDM Diagnosis L97.222 Non-pressure chronic ulcer of left calf with fat layer exposed 12/04/2016 No Yes L88 Pyoderma gangrenosum 03/26/2017 No Yes I87.2 Venous insufficiency (chronic) (peripheral) 12/04/2016 No Yes L03.116 Cellulitis of left lower limb 05/24/2019 No Yes Inactive Problems ICD-10 Code Description Active Date Inactive Date L97.213 Non-pressure chronic ulcer of right calf with necrosis of muscle 04/02/2017 04/02/2017 Resolved Problems Electronic Signature(s) Signed: 09/20/2019 8:51:49 AM By: Worthy Keeler PA-C Entered By: Worthy Keeler on 09/20/2019 08:51:49 [name] ([id number]) -------------------------------------------------------------------------------- Progress Note Details Patient Name: [name] Date of Service: 09/20/2019 8:15 AM Medical Record Number: [id number] Patient Account Number: [id number] Date of Birth/Sex: 09/13/1980 (39 y.o. M) Treating RN: Army Melia Primary Care Provider: [name] Other Clinician: Referring Provider: [name] Treating Provider/Extender: Melburn Hake, Tiphany Fayson Weeks in Treatment: 145 Subjective Chief Complaint Information obtained from Patient He is here in follow up evaluation for LLE pyoderma ulcer History of Present Illness (HPI) 12/04/16; 39 year old man who comes into the clinic today for review of a wound on the posterior left calf. He tells me that is been there for about a year. He is not a diabetic he does smoke half a pack per day. He was seen in the ER on 11/20/16 felt to have cellulitis around the wound and was given clindamycin. An x-ray did not show osteomyelitis. The patient initially tells me that he has a milk allergy that sets off a pruritic  itching rash on his lower legs which she scratches incessantly and he thinks that's what may have set up the  wound. He has been using various topical antibiotics and ointments without any effect. He works in a [employer] and is on his feet all day. He does not have a prior history of wounds however he does have the rash on both lower legs the right arm and the ventral aspect of his left arm. These are excoriations and clearly have had scratching however there are of macular looking areas on both legs including a substantial larger area on the right leg. This does not have an underlying open area. There is no blistering. The patient tells me that 2 years ago in [place] in response to the rash on his legs he saw a dermatologist who told him he had a condition which may be pyoderma gangrenosum although I may be putting words into his mouth. He seemed to recognize this. 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 He is not in any treatment for this. He's had no recent travel 12/11/16; the patient arrives today with his wound and roughly the same condition we've been using silver alginate this is a deep punched out wound with some surrounding erythema but no tenderness. Biopsy I did did not show confirmed pyoderma gangrenosum suggested nonspecific inflammation and vasculitis but does not provide an actual description of what was seen by the pathologist. I'm really not able to understand this We have also received information from the patient's dermatologist in [place] notes from April 2016. This was a doctor Agarwal-antal. 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 He was prescribed topical steroid high potency under occlusion which helped but at this point the patient did not have a deep punched out wound. 12/18/16; the patient's wound is larger in terms of surface area however this surface looks better and there is less depth. The surrounding  erythema also is better. The patient states that the wrap we put on came off 2 days ago when he has been using his compression stockings. He we are in the process of getting a dermatology consult. 12/26/16 on evaluation today patient's left lower extremity wound shows evidence of infection with surrounding erythema noted. He has been tolerating the dressing changes but states that he has noted more discomfort. There is a larger area of erythema surrounding the wound. No fevers, chills, nausea, or vomiting noted at this time. With that being said the wound still does have slough covering the surface. He is not allergic to any medication that he is aware of at this point. In regard to his right lower extremity he had several regions that are erythematous and pruritic he wonders if there's anything we can do to help that. 01/02/17 I reviewed patient's wound culture which was obtained his visit last week. He was placed on doxycycline at that point. Unfortunately that does not appear to be an antibiotic that would likely help with the situation however the pseudomonas noted on culture is sensitive to Cipro. Also unfortunately patient's wound seems to have a large compared to last week's evaluation. Not severely so but there are definitely increased measurements in general. He is continuing to have discomfort as well he writes this to be a seven out of 10. In fact he would prefer me not to perform any debridement today due to the fact that he is having discomfort and considering he has an active infection on the little reluctant to do so anyway. No fevers, chills, nausea, or vomiting noted at this time. 01/08/17; patient seems dermatology on  September 5. I suspect dermatology will want the slides from the biopsy I did sent to their pathologist. I'm not sure if there is a way we can expedite that. In any case the culture I did before I left on vacation 3 weeks ago showed Pseudomonas he was given 10 days of  Cipro and per her description of her intake nurses is actually somewhat better this week although the wound is quite a bit bigger than I remember the last time I saw this. He still has 3 more days of Cipro 01/21/17; dermatology appointment tomorrow. He has completed the ciprofloxacin for Pseudomonas. Surface of the wound looks better however he is had some deterioration in the lesions on his right leg. Meantime the left lateral leg wound we will continue with sample 01/29/17; patient had his dermatology appointment but I can't yet see that note. He is completed his antibiotics. The wound is more superficial but considerably larger in circumferential area than when he came in. This is in his left lateral calf. He also has swollen erythematous areas with superficial wounds on the right leg and small papular areas on both arms. There apparently areas in her his upper thighs and buttocks I did not look at those. Dermatology biopsied the right leg. Hopefully will have their input next week. 02/05/17; patient went back to see his dermatologist who told him that he had a "scratching problem" as well as staph. He is now on a 30 day course of doxycycline and I believe she gave him triamcinolone cream to the right leg areas to help with the itching [not exactly sure but probably triamcinolone]. She apparently looked at the left lateral leg wound although this was not rebiopsied and I think felt to be ultimately part of the same pathogenesis. He is using sample border foam and changing nevus himself. He now has a new open area on the right posterior leg which was his biopsy site I don't have any of the dermatology notes 02/12/17; we put the patient in compression last week with SANTYL to the wound on the left leg and the biopsy. Edema is much better and the depth of the wound is now at level of skin. Area is still the same Biopsy site on the right lateral leg we've also been using santyl with a border foam dressing  and he is changing this himself. 02/19/17; Using silver alginate started last week to both the substantial left leg wound and the biopsy site on the right wound. He is tolerating compression well. Has a an appointment with his primary M.D. tomorrow wondering about diuretics although I'm wondering if the edema problem is actually lymphedema [name], [name] ([id number]) 02/26/17; the patient has been to see his primary doctor Dr. Jerrel Ivory at [facility] our primary care. She started him on Lasix 20 mg and this seems to have helped with the edema. However we are not making substantial change with the left lateral calf wound and inflammation. The biopsy site on the right leg also looks stable but not really all that different. 03/12/17; the patient has been to see vein and vascular Dr. Lucky Cowboy. He has had venous reflux studies I have not reviewed these. I did get a call from his dermatology office. They felt that he might have pathergy based on their biopsy on his right leg which led them to look at the slides of the biopsy I did on the left leg and they wonder whether this represents pyoderma gangrenosum which was the original supposition  in a man with ulcerative colitis albeit inactive for many years. They therefore recommended clobetasol and tetracycline i.e. 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 03/26/17; apparently the patient just had reflux studies not an appointment with Dr. dew. She arrives in clinic today having applied clobetasol for 2-3 weeks. He notes over the last 2-3 days excessive drainage having to change the dressing 3-4 times a day and also expanding erythema. 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 Lillia Carmel at [facility]  dermatology locally who kindly saw him at my request. 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 My concern was the degree of erythema medially extending from his left leg wound which was either inflammation from pyoderma or cellulitis. I put him on Augmentin however culture of the wound showed Pseudomonas which is quinolone sensitive. I really don't believe he has cellulitis however in view of everything I will continue and give him a course of Cipro. He is also on doxycycline as an immune modulator for the pyoderma. In addition to his original wound on the left lateral leg with surrounding erythema he has a wound on the right posterior calf which was an original biopsy site done by dermatology. 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 Saw Dr. Lillia Carmel yesterday. He has been using topical antibiotics to both wound areas his original wound on the left and the biopsies/pathergy area on the right. There is definitely some improvement in the inflammation around the wound on the right although the patient states he has increasing sensitivity of the wounds. He is on prednisone 60 and doxycycline 1 as prescribed by Dr. Lillia Carmel. He is covering the topical antibiotic with gauze and putting this in his own compression stocks and changing this daily. He states that Dr. Lottie Rater did a culture of the left leg wound yesterday 05/07/17; pyoderma gangrenosum. The patient saw Dr. Lillia Carmel yesterday and has a follow-up with her in one month. He is still using topical antibiotics to both wounds although he can't recall exactly what type. He is still on prednisone 60 mg. Dr. Lillia Carmel stated that the doxycycline could stop if we were in agreement. 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  The right medial leg was induced by biopsy/pathergy. The area on the right is essentially healed. 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 The right medial leg has closed and remains closed. He is still on prednisone 60. He tells me he missed his last dermatology appointment with Dr. Lillia Carmel but will make another appointment. He reports that her blood sugar at a recent screen in [place] was high 200's. He was 180 today. 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 I think he is going to require still much longer prednisone perhaps another 3 months before attempting to taper. In the meantime his wound is a lot better. Smaller. He is cleaning this off daily and applying topical antibiotics. When he was last in the clinic I thought about changing to [facility] and actually put in a couple  of calls to dermatology although probably not during their business hours. 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 He continues on oral prednisone. He has been using triple antibiotic ointment. There is surface debris and we will transition to [facility] and have him return in 2 weeks. He has lost 30 pounds since his last appointment with lifestyle modification. He may benefit from topical steroid cream for treatment this can be considered at a later date. 08/22/17 on evaluation today patient appears to actually be doing rather well in regard to his left lateral lower extremity ulcer. He has actually been managed by Dr. Dellia Nims most recently. Patient is currently on oral steroids at this time. This seems to have been of benefit for him. Nonetheless his last visit was actually with Leah on 08/06/17. Currently he is not utilizing any topical steroid creams although this could be of benefit as well.  No fevers, chills, nausea, or vomiting noted at this time. 09/05/17 on evaluation today patient appears to be doing better in regard to his left lateral lower extremity ulcer. He has been tolerating the dressing changes without complication. He is using Santyl with good effect. Overall I'm very pleased with how things are standing at this point. Patient likewise is happy that this is doing better. 09/19/17 on evaluation today patient actually appears to be doing rather well in regard to his left lateral lower extremity ulcer. 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 He's not having any significant pain. 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 He has been tolerating the Santyl without complication and in general I feel like he's making good progress. 10/17/17 on evaluation today patient appears to be doing very well in regard to his left lateral lower surety ulcer. He has been tolerating the dressing changes without complication. There does not appear to be any evidence of infection he's alternating the Santyl and the triple antibiotic ointment every other day this seems to be doing well for him. 11/03/17 on evaluation today patient appears to be doing very well in regard to his left lateral lower extremity ulcer. He is been tolerating the dressing changes without complication which is good news. Fortunately there does not appear to be any evidence of infection which is also great news. Overall is doing excellent they are starting to taper down on the prednisone is down to 40 mg at this point it also started topical clobetasol for him. 11/17/17 on evaluation today patient appears to be doing well in regard to his left lateral lower surety ulcer. He's been tolerating the dressing changes without complication. He does  note that he is having no pain, no excessive drainage or discharge, and overall he feels like things are going about how he would expect and hope they would. Overall he seems to have no evidence of infection at this time in my opinion which is good news. 12/04/17-He is seen in follow-up evaluation for right lateral lower extremity ulcer. He has been applying topical steroid cream. Today's measurement show slight increase in size. Over the next 2 weeks we will transition to every other day Santyl and steroid cream. He has been encouraged to monitor for changes and  notify clinic with any concerns 12/15/17 on evaluation today patient's left lateral motion the ulcer and fortunately is doing worse again at this point. This just since last week to this week has close to doubled in size according to the patient. I did not seeing last week's I do not have a visual to compare this to in our system was also down so we do not have all the charts and at this point. Nonetheless it does have me somewhat concerned in regard to the fact that again he was worried enough about it he has contact the dermatology that placed them back on the full strength, 50 mg a day of the prednisone that he was taken previous. He continues to alternate using clobetasol along with Santyl at this point. He is obviously somewhat frustrated. [name], [name] ([id number]) 12/22/17 on evaluation today patient appears to be doing a little worse compared to last evaluation. Unfortunately the wound is a little deeper and slightly larger than the last week's evaluation. With that being said he has made some progress in regard to the irritation surrounding at this time unfortunately despite that progress that's been made he still has a significant issue going on here. I'm not certain that he is having really any true infection at this time although with the Pyoderma gangrenosum it can sometimes be difficult to differentiate infection versus just  inflammation. For that reason I discussed with him today the possibility of perform a wound culture to ensure there's nothing overtly infected. 01/06/18 on evaluation today patient's wound is larger and deeper than previously evaluated. With that being said it did appear that his wound was infected after my last evaluation with him. Subsequently I did end up prescribing a prescription for Bactrim DS which she has been taking and having no complication with. Fortunately there does not appear to be any evidence of infection at this point in time as far as anything spreading, no want to touch, and overall I feel like things are showing signs of improvement. 01/13/18 on evaluation today patient appears to be even a little larger and deeper than last time. There still muscle exposed in the base of the wound. Nonetheless he does appear to be less erythematous I do believe inflammation is calming down also believe the infection looks like it's probably resolved at this time based on what I'm seeing. No fevers, chills, nausea, or vomiting noted at this time. 01/30/18 on evaluation today patient actually appears to visually look better for the most part. Unfortunately those visually this looks better he does seem to potentially have what may be an abscess in the muscle that has been noted in the central portion of the wound. This is the first time that I have noted what appears to be fluctuance in the central portion of the muscle. With that being said I'm somewhat more concerned about the fact that this might indicate an abscess formation at this location. I do believe that an ultrasound would be appropriate. This is likely something we need to try to do as soon as possible. He has been switch to mupirocin ointment and he is no longer using the steroid ointment as prescribed by dermatology he sees them again next week he's been decreased from 60 to 40 mg of prednisone. 03/09/18 on evaluation today patient  actually appears to be doing a little better compared to last time I saw him. There's not as much erythema surrounding the wound itself. He I did review his most recent infectious  disease note which was dated 02/24/18. He saw Dr. Michel Bickers in [place]. 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 Patient is now off of antibiotics and they are continually observing this. There seems to be no change in the past two weeks in my pinion based on what the patient says and what I see today compared to what Dr. Megan Salon likely saw two weeks ago. No fevers, chills, nausea, or vomiting noted at this time. 03/23/18 on evaluation today patient's wound actually appears to be showing signs of improvement which is good news. He is currently still on the Dapsone. He is also working on tapering the prednisone to get off of this and Dr. Lottie Rater is working with him in this regard. Nonetheless overall I feel like the wound is doing well it does appear based on the infectious disease note that I reviewed from Dr. 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 I did also review the results of his ultrasound of left lower extremity which revealed there was a dentist tissue in the base of the wound without an abscess noted. 04/06/18 on evaluation today the patient's left lateral lower extremity ulcer actually appears to be doing fairly well which is excellent news. There does not appear to be any evidence of infection at this time which is also great news. Overall he still does have a significantly large ulceration although little by little he seems to be making progress. He is down to 10 mg a day of the prednisone. 04/20/18 on evaluation today patient actually appears to be doing excellent at this time in regard to his left  lower extremity ulcer. He's making signs of good progress unfortunately this is taking much longer than we would really like to see but nonetheless he is making progress. Fortunately there does not appear to be any evidence of infection at this time. No fevers, chills, nausea, or vomiting noted at this time. The patient has not been using the Santyl due to the cost he hadn't got in this field yet. He's mainly been using the antibiotic ointment topically. Subsequently he also tells me that he really has not been scrubbing in the shower I think this would be helpful again as I told him it doesn't have to be anything too aggressive to even make it believe just enough to keep it free of some of the loose slough and biofilm on the wound surface. 05/11/18 on evaluation today patient's wound appears to be making slow but sure progress in regard to the left lateral lower extremity ulcer. He is been tolerating the dressing changes without complication. Fortunately there does not appear to be any evidence of infection at this time. He is still just using triple antibiotic ointment along with clobetasol occasionally over the area. He never got the Santyl and really does not seem to intend to in my pinion. 06/01/18 on evaluation today patient appears to be doing a little better in regard to his left lateral lower extremity ulcer. He states that overall he does not feel like he is doing as well with the Dapsone as he did with the prednisone. Nonetheless he sees his dermatologist later today and is gonna talk to them about the possibility of going back on the prednisone. Overall again I believe that the  wound would be better if you would utilize Santyl but he really does not seem to be interested in going back to the [place] at this point. He has been using triple antibiotic ointment. 06/15/18 on evaluation today patient's wound actually appears to be doing about the same at this point. Fortunately there is no signs of  infection at this time. He has made slight improvements although he continues to not really want to clean the wound bed at this point. He states that he just doesn't mess with it he doesn't want to cause any problems with everything else he has going on. He has been on medication, antibiotics as prescribed by his dermatologist, for a staff infection of his lower extremities which is really drying out now and looking much better he tells me. Fortunately there is no sign of overall infection. 06/29/18 on evaluation today patient appears to be doing well in regard to his left lateral lower surety ulcer all things considering. Fortunately his staff infection seems to be greatly improved compared to previous. He has no signs of infection and this is drying up quite nicely. He is still the doxycycline for this is no longer on cental, Dapsone, or any of the other medications. His dermatologist has recommended possibility of an infusion but right now he does not want to proceed with that. 07/13/18 on evaluation today patient appears to be doing about the same in regard to his left lateral lower surety ulcer. Fortunately there's no signs of infection at this time which is great news. Unfortunately he still builds up a significant amount of Slough/biofilm of the surface of the wound he still is not really cleaning this as he should be appropriately. Again I'm able to easily with saline and gauze remove the majority of this on the surface which if you would do this at home would likely be a dramatic improvement for him as far as getting the area to improve. Nonetheless overall I still feel like he is making progress is just very slow. I think Santyl will be of benefit for him as well. Still he has not gotten this as of this point. 07/27/18 on evaluation today patient actually appears to be doing little worse in regards of the erythema around the periwound region of the wound he also tells me that he's been having  more drainage currently compared to what he was experiencing last time I saw him. He states not quite as bad as what he had because this was infected previously but nonetheless is still appears to be doing poorly. Fortunately there is no evidence of systemic infection at this point. The patient tells me that he is not going to be able to afford the Santyl. He is still waiting to hear about the infusion therapy with his dermatologist. Apparently she wants an updated colonoscopy first. CHANOCH, MCCLEERY ([id number]) 08/10/18 on evaluation today patient appears to be doing better in regard to his left lateral lower extremity ulcer. Fortunately he is showing signs of improvement in this regard he's actually been approved for Remicade infusion's as well although this has not been scheduled as of yet. Fortunately there's no signs of active infection at this time in regard to the wound although he is having some issues with infection of the right lower extremity is been seen as dermatologist for this. Fortunately they are definitely still working with him trying to keep things under control. 09/07/18 on evaluation today patient is actually doing rather well in regard to his left  lateral lower extremity ulcer. He notes these actually having some hair grow back on his extremity which is something he has not seen in years. He also tells me that the pain is really not giving them any trouble at this time which is also good news overall she is very pleased with the progress he's using a combination of the mupirocin along with the probate is all mixed. 09/21/18 on evaluation today patient actually appears to be doing fairly well all things considered in regard to his looks from the ulcer. He's been tolerating the dressing changes without complication. Fortunately there's no signs of active infection at this time which is good news he is still on all antibiotics or prevention of the staff infection. He has been on  prednisone for time although he states it is gonna contact his dermatologist and see if she put them on a short course due to some irritation that he has going on currently. Fortunately there's no evidence of any overall worsening this is going very slow I think cental would be something that would be helpful for him although he states that $50 for tube is quite expensive. He therefore is not willing to get that at this point. 10/06/18 on evaluation today patient actually appears to be doing decently well in regard to his left lateral leg ulcer. He's been tolerating the dressing changes without complication. Fortunately there's no signs of active infection at this time. Overall I'm actually rather pleased with the progress he's making although it's slow he doesn't show any signs of infection and he does seem to be making some improvement. I do believe that he may need a switch up and dressings to try to help this to heal more appropriately and quickly. 10/19/18 on evaluation today patient actually appears to be doing better in regard to his left lateral lower extremity ulcer. This is shown signs of having much less Slough buildup at this point due to the fact he has been using the [employer]. Obviously this is very good news. The overall size of the wound is not dramatically smaller but again the appearance is. 11/02/18 on evaluation today patient actually appears to be doing quite well in regard to his lower [place] ulcer. A lot of the skin around the ulcer is actually somewhat irritating at this point this seems to be more due to the dressing causing irritation from the adhesive that anything else. Fortunately there is no signs of active infection at this time. 11/24/18 on evaluation today patient appears to be doing a little worse in regard to his overall appearance of his lower extremity ulcer. There's more erythema and warmth around the wound unfortunately. He is currently on doxycycline which he has been  on for some time. With that being said I'm not sure that seems to be helping with what appears to possibly be an acute cellulitis with regard to his left lower extremity ulcer. No fevers, chills, nausea, or vomiting noted at this time. 12/08/18 on evaluation today patient's wounds actually appears to be doing significantly better compared to his last evaluation. He has been using Santyl along with alternating tripling about appointment as well as the steroid cream seems to be doing quite well and the wound is showing signs of improvement which is excellent news. Fortunately there's no evidence of infection and in fact his culture came back negative with only normal skin flora noted. 12/21/2018 upon evaluation today patient actually appears to be doing excellent with regard to his ulcer. This  is actually the best that I have seen it since have been helping to take care of him. It is both smaller as well as less slough noted on the surface of the wound and seems to be showing signs of good improvement with new skin growing from the edges. He has been using just the triamcinolone he does wonder if he can get a refill of that ointment today. 01/04/2019 upon evaluation today patient actually appears to be doing well with regard to his left lateral lower extremity ulcer. With that being said it does not appear to be that he is doing quite as well as last time as far as progression is concerned. There does not appear to be any signs of infection or significant irritation which is good news. With that being said I do believe that he may benefit from switching to a collagen based dressing based on how clean The wound appears. 01/18/2019 on evaluation today patient actually appears to be doing well with regard to his wound on the left lower extremity. He is not made a lot of progress compared to where we were previous but nonetheless does seem to be doing okay at this time which is good news. There is no signs of  active infection which is also good news. My only concern currently is I do wish we can get him into utilizing the collagen dressing his insurance would not pay for the supplies that we ordered although it appears that he may be able to order this through his supply company that he typically utilizes. This is [employer]. Nonetheless he did try to order it during the office visit today and it appears this did go through. We will see if he can get that it is a different brand but nonetheless he has collagen and I do think will be beneficial. 02/01/2019 on evaluation today patient actually appears to be doing a little worse today in regard to the overall size of his wounds. Fortunately there is no signs of active infection at this time. That is visually. Nonetheless when this is happened before it was due to infection. For that reason were somewhat concerned about that this time as well. 02/08/2019 on evaluation today patient unfortunately appears to be doing slightly worse with regard to his wound upon evaluation today. Is measuring a little deeper and a little larger unfortunately. I am not really sure exactly what is causing this to enlarge he actually did see his dermatologist she is going to see about initiating Humira for him. Subsequently she also did do steroid injections into the wound itself in the periphery. Nonetheless still nonetheless he seems to be getting a little bit larger he is gone back to just using the steroid cream topically which I think is appropriate. I would say hold off on the collagen for the time being is definitely a good thing to do. Based on the culture results which we finally did get the final result back regarding it shows staph as the bacteria noted again that can be a normal skin bacteria based on the fact however he is having increased drainage and worsening of the wound measurement wise I would go ahead and place him on an antibiotic today I do believe for  this. 02/15/2019 on evaluation today patient actually appears to be doing somewhat better in regard to his ulcer. There is no signs of worsening at this time I did review his culture results which showed evidence of Staphylococcus aureus but not MRSA. Again  this could just be more related to the normal skin bacteria although he states the drainage has slowed down quite a bit he may have had a mild infection not just colonization. And was much smaller and then since around10/04/2019 on evaluation today patient appears to be doing unfortunately worse as far as the size of the wound. I really feel like that this is steadily getting larger again it had been doing excellent right at the beginning of September we have seen a steady increase in the area of the wound it is almost 2-1/2 times the size it was on September 1. Obviously this is a bad trend this is not wanting to see. For that reason we went back to using just the topical triamcinolone cream which does seem to help with inflammation. I checked him for bacteria by way of culture and nothing showed positive there. I am considering giving him a short course of a tapering steroid Hunt [name], [name] ([id number]) today to see if that is can be beneficial for him. The patient is in agreement with giving that a try. 03/08/2019 on evaluation today patient appears to be doing very well in comparison to last evaluation with regard to his lower extremity ulcer. This is showing signs of less inflammation and actually measuring slightly smaller compared to last time every other week over the past month and a half he has been measuring larger larger larger. Nonetheless I do believe that the issue has been inflammation the prednisone does seem to have been beneficial for him which is good news. No fevers, chills, nausea, vomiting, or diarrhea. 03/22/2019 on evaluation today patient appears to be doing about the same with regard to his leg ulcer. He has been  tolerating the dressing changes without complication. With that being said the wound seems to be mostly arrested at its current size but really is not making any progress except for when we prescribed the prednisone. He did show some signs of dropping as far as the overall size of the wound during that interval week. Nonetheless this is something he is not on long-term at this point and unfortunately I think he is getting need either this or else the Humira which his dermatologist has discussed try to get approval for. With that being said he will be seeing his dermatologist on the 11th of this month that is November. 04/19/2019 on evaluation today patient appears to be doing really about the same the wound is measuring slightly larger compared to last time I saw him. He has not been into the office since November 2 due to the fact that he unfortunately had Covid as that his entire family. He tells me that it was rough but they did pull-through and he seems to be doing much better. Fortunately there is no signs of active infection at this time. No fevers, chills, nausea, vomiting, or diarrhea. 05/10/2019 on evaluation today patient unfortunately appears to be doing significantly worse as compared to last time I saw him. He does tell me that he has had his first dose of Humira and actually is scheduled to get the next one in the upcoming week. With that being said he tells me also that in the past several days he has been having a lot of issues with green drainage she showed me a picture this is more blue-green in color. He is also been having issues with increased sloughy buildup and the wound does appear to be larger today. Obviously this is not the direction  that we want everything to take based on the starting of his Humira. Nonetheless I think this is definitely a result of likely infection and to be honest I think this is probably Pseudomonas causing the infection based on what I am  seeing. 05/24/2019 on evaluation today patient unfortunately appears to be doing significantly worse compared to his prior evaluation with me 2 weeks ago. I did review his culture results which showed that he does have Staph aureus as well as Pseudomonas noted on the culture. Nonetheless the Levaquin that I prescribed for him does not appear to have been appropriate and in fact he tells me he is no longer experiencing the green drainage and discharge that he had at the last visit. Fortunately there is no signs of active infection at this time which is good news although the wound has significantly worsened it in fact is much deeper than it was previous. We have been utilizing up to this point triamcinolone ointment as the prescription topical of choice but at this time I really feel like that the wound is getting need to be packed in order to appropriately manage this due to the deeper nature of the wound. Therefore something along the lines of an alginate dressing may be more appropriate. 05/31/2019 upon inspection today patient's wound actually showed signs of doing poorly at this point. Unfortunately he just does not seem to be making any good progress despite what we have tried. He actually did go ahead and pick up the Cipro and start taking that as he was noticing more green drainage he had previously completed the Levaquin that I prescribed for him as well. Nonetheless he missed his appointment for the seventh last week on Wednesday with the wound care center and [facility] where his dermatologist referred him. Obviously I do think a second opinion would be helpful at this point especially in light of the fact that the patient seems to be doing so poorly despite the fact that we have tried everything that I really know how at this point. The only thing that ever seems to have helped him in the past is when he was on high doses of continual steroids that did seem to make a difference for him.  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 06/07/2019 upon evaluation today patient unfortunately appears to be doing worse yet again with regard to his wound. In fact I am starting to question whether or not he may have a fluid pocket in the muscle at this point based on the bulging and the soft appearance to the central portion of the muscle area. There is not anything draining from the muscle itself at this time which is good news but nonetheless the wound is expanding. I am not really seeing any results of the Humira as far as overall wound progression based on what I am seeing at this point. The patient has been referred for second opinion with regard to his wound to the [facility] wound care center by his dermatologist which I definitely am not in opposition to. Unfortunately we tried multiple dressings in the past including collagen, alginate, and at one point even Hydrofera Blue. With that being said he is never really used it for any significant amount of time due to the fact that he often  complains of pain associated with these dressings and then will go back to either using the Santyl which she has done intermittently or more frequently the triamcinolone. He is also using his own compression stockings. We have wrapped him in the past but again that was something else that he really was not a big fan of. 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 He seemed to do much better when he was on the steroids although honestly I  understand that the steroids are not necessarily the best medication to be on long-term obviously 06/14/2019 on evaluation today patient appears to be doing actually a little bit better with regard to the overall appearance with his leg. Unfortunately he does continue to have issues with what appears to be some fluid underneath the muscle although he did see the wound specialty center at [facility] last week their main goals were to see about infusion therapy in place of the Humira as they feel like that is not quite strong enough. They also recommended that we continue with the treatment otherwise as we are they felt like that was appropriate and they are okay with him continuing to follow-up here with [place] in that regard. With that being said they are also sending him to the vein specialist there to see about vein stripping and if that would be of benefit for him. Subsequently they also did not really address whether or not an ultrasound of the muscle area to see if there is anything that needs to be addressed here would be appropriate or not. For that reason I discussed this with him last week I think we may proceed down that road at this point. 06/21/2019 upon evaluation today patient's wound actually appears to be doing slightly better compared to previous evaluations. I do believe that he has made a difference with regard to the progression here with the use of oral steroids. Again in the past has been the only thing that is really calm things down. He does tell me that from [facility] is gotten a good news from there that there are no further vein stripping that is necessary at this point. I do not have that available for review today although the patient did relay this to me. He also did obtain and have the ultrasound of the wound completed which I did sign off on today. It does appear that there is no fluid collection under the muscle this is likely then just edematous tissue in general. That is also good news.  Overall I still believe the inflammation is the main issue here. He did inquire about the possibility of a wound VAC again with the muscle protruding like it is I am not really sure whether the wound VAC is necessarily ideal or not. That is something we will have to consider although I do believe he may need compression wrapping to try to help with edema control which could potentially be of benefit. 06/28/2019 on evaluation today patient appears to be doing slightly better measurement wise although this is not terribly smaller he least seems to be trending towards that direction. With that being said he still seems to  have purulent drainage noted in the wound bed at this time. He has been on Levaquin followed by Cipro over the past month. Unfortunately he still seems to have some issues with active infection at this time. I did perform a culture last week in order to evaluate and see if indeed there was still anything going on. Subsequently the culture did come back Bialy, Lauren ([id number]) showing Pseudomonas which is consistent with the drainage has been having which is blue-green in color. He also has had an odor that again was somewhat consistent with Pseudomonas as well. Long story short it appears that the culture showed an intermediate finding with regard to how well the Cipro will work for the Pseudomonas infection. Subsequently being that he does not seem to be clearing up and at best what we are doing is just keeping this at [place] I think he may need to see infectious disease to discuss IV antibiotic options. 07/05/2019 upon evaluation today patient appears to be doing okay in regard to his leg ulcer. He has been tolerating the dressing changes at this point without complication. Fortunately there is no signs of active infection at this time which is good news. No fevers, chills, nausea, vomiting, or diarrhea. With that being said he does have an appointment with infectious disease tomorrow and  his primary care on Wednesday. Again the reason for the infectious disease referral was due to the fact that he did not seem to be fully resolving with the use of oral antibiotics and therefore we were thinking that IV antibiotic therapy may be necessary secondary to the fact that there was an intermediate finding for how effective the Cipro may be. Nonetheless again he has been having a lot of purulent and even green drainage. Fortunately right now that seems to have calmed down over the past week with the reinitiation of the oral antibiotic. Nonetheless we will see what Dr. Megan Salon has to say. 07/12/2019 upon evaluation today patient appears to be doing about the same at this point in regard to his left lower extremity ulcer. Fortunately there is no signs of active infection at this time which is good news I do believe the Levaquin has been beneficial I did review Dr. Hale Bogus note and to be honest I agree that the patient's leg does appear to be doing better currently. What we found in the past as he does not seem to really completely resolve he will stop the antibiotic and then subsequently things will revert back to having issues with blue-green drainage, increased pain, and overall worsening in general. Obviously that is the reason I sent him back to infectious disease. 07/19/2019 upon evaluation today patient appears to be doing roughly the same in size there is really no dramatic improvement. He has started back on the Levaquin at this point and though he seems to be doing okay he did still have a lot of blue/green drainage noted on evaluation today unfortunately. I think that this is still indicative more likely of a Pseudomonas infection as previously noted and again he does see Dr. Megan Salon in just a couple of days. I do not know that were really able to effectively clear this with just oral antibiotics alone based on what I am seeing currently. Nonetheless we are still continue to try to  manage as best we can with regard to the patient and his wound. I do think the wrap was helpful in decreasing the edema which is excellent news. No fevers, chills, nausea, vomiting,  or diarrhea. 07/26/2019 upon evaluation today patient appears to be doing slightly better with regard to the overall appearance of the muscle there is no dark discoloration centrally. Fortunately there is no signs of active infection at this time. No fevers, chills, nausea, vomiting, or diarrhea. Patient's wound bed currently the patient did have an appointment with Dr. Megan Salon at infectious disease last week. With that being said Dr. Megan Salon the patient states was still somewhat hesitant about put him on any IV antibiotics he wanted [place] to repeat cultures today and then see where things go going forward. He does look like Dr. Megan Salon because of some improvement the patient did have with the Levaquin wanted [place] to see about repeating cultures. If it indeed grows the Pseudomonas again then he recommended a possibility of considering a PICC line placement and IV antibiotic therapy. He plans to see the patient back in 1 to 2 weeks. 08/02/2019 upon evaluation today patient appears to be doing poorly with regard to his left lower extremity. We did get the results of his culture back it shows that he is still showing evidence of Pseudomonas which is consistent with the purulent/blue-green drainage that he has currently. Subsequently the culture also shows that he now is showing resistance to the oral fluoroquinolones which is unfortunate as that was really the only thing to treat the infection prior. I do believe that he is looking like this is going require IV antibiotic therapy to get this under control. Fortunately there is no signs of systemic infection at this time which is good news. The patient does see Dr. Megan Salon tomorrow. 08/09/2019 upon evaluation today patient appears to be doing better with regard to his left lower  extremity ulcer in regard to the overall appearance. He is currently on IV antibiotic therapy. As ordered by Dr. Megan Salon. Currently the patient is on ceftazidime which she is going to take for the next 2 weeks and then follow-up for 4 to 5-week appointment with Dr. Megan Salon. The patient started this this past Friday symptoms have not for a total of 3 days currently in full. 08/16/2019 upon evaluation today patient's wound actually does show muscle in the base of the wound but in general does appear to be much better as far as the overall evidence of infection is concerned. In fact I feel like this is for the most part cleared up he still on the IV antibiotics he has not completed the full course yet but I think he is doing much better which is excellent news. 08/23/2019 upon evaluation today patient appears to be doing about the same with regard to his wound at this point. He tells me that he still has pain unfortunately. Fortunately there is no evidence of systemic infection at this time which is great news. There is significant muscle protrusion. 09/13/19 upon evaluation today patient appears to be doing about the same in regard to his leg unfortunately. He still has a lot of drainage coming from the ulceration there is still muscle exposed. With that being said the patient's last wound culture still showed an intermediate finding with regard to the Pseudomonas he still having the bluish/green drainage as well. Overall I do not know that the wound has completely cleared of infection at this point. Fortunately there is no signs of active infection systemically at this point which is good news. 09/20/2019 upon evaluation today patient's wound actually appears to be doing about the same based on what I am seeing currently. I do not  see any signs of systemic infection he still does have evidence of some local infection and drainage. He did see Dr. Megan Salon last week and Dr. Megan Salon states that he probably  does need a different IV antibiotic although he does not want to put him on this until the patient begins the Remicade infusion which is actually scheduled for about 10 days out from today on 13 May. Following that time Dr. Megan Salon is good to see him back and then will evaluate the feasibility of starting him on the IV antibiotic therapy once again at that point. I do not disagree with this plan I do believe as Dr. Megan Salon stated in his note that I reviewed today that the patient's issue is multifactorial with the pyoderma being 1 aspect of this that were hoping the Remicade will be helpful for her. In the meantime I think the gentamicin is, helping to keep things under decent okay control in regard to the ulcer. Objective Constitutional Well-nourished and well-hydrated in no acute distress. KIZER, NOBBE ([id number]) Vitals Time Taken: 8:22 AM, Height: 71 in, Weight: 338 lbs, BMI: 47.1, Temperature: 98.8 F, Pulse: 89 bpm, Respiratory Rate: 18 breaths/min, Blood Pressure: 115/72 mmHg. Respiratory normal breathing without difficulty. Psychiatric this patient is able to make decisions and demonstrates good insight into disease process. Alert and Oriented x 3. pleasant and cooperative. General Notes: Upon inspection patient's wound did still show evidence of significant muscle exposure at this point there is some granulation noted in the ventral side of the wound which is good news. There is no evidence of systemic infection at this point which is good news. Even with the local infection I think the gentamicin cream is probably still the best option for him at this time. At least until he can get the PICC line replaced and begin on the Remicade as well as the IV antibiotic therapy. Integumentary (Hair, Skin) Wound #1 status is Open. Original cause of wound was Gradually Appeared. The wound is located on the Left,Lateral Lower Leg. The wound measures 6.9cm length x 7.7cm width x 1.3cm depth;  41.728cm^2 area and 54.247cm^3 volume. There is muscle and Fat Layer (Subcutaneous Tissue) Exposed exposed. There is no tunneling or undermining noted. There is a medium amount of purulent drainage noted. The wound margin is epibole. There is medium (34-66%) pink granulation within the wound bed. There is a medium (34-66%) amount of necrotic tissue within the wound bed including Adherent Slough. 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 Anesthetic (add to Medication List): Wound #1 Left,Lateral Lower Leg: Topical Lidocaine 4% cream applied to wound bed prior to debridement (In Clinic Only). Primary Wound Dressing: Gentamicin Sulfate Cream Secondary Dressing: Wound #1 Left,Lateral Lower Leg: Boardered Foam Dressing Dressing Change Frequency: Wound #1 Left,Lateral Lower Leg: Change dressing every day. Follow-up Appointments: Wound #1 Left,Lateral Lower Leg: Return Appointment in 1 week. Edema Control: Wound #1 Left,Lateral Lower Leg: Elevate legs to the level of the heart and pump ankles as often as possible 1 I would recommend currently that we continue with the gentamicin cream. Over top of this we have been utilizing a border foam dressing which has done extremely well for the patient as well. 2. I am also can recommend currently that he continue to elevate his legs at this point. Obviously the morning keep edema  under control the better he is using his own compression stockings well at this time. 3. I am also going to suggest that the patient continue to change the dressing at least every day obviously if he needs to change it sooner he can but currently every day dressing changes seem to have done well he tells me. 4. The patient is good to be  having a Remicade infusion on May 13 and then following that Dr. Hale Bogus, see about reinitiation of the IV antibiotic therapy. I think the combination of this we are extremely hopeful will be helpful for him. IVERSON, SEES ([id number]) We will see patient back for reevaluation in 1 week here in the clinic. If anything worsens or changes patient will contact our office for additional recommendations. Electronic Signature(s) Signed: 09/20/2019 2:54:23 PM By: Worthy Keeler PA-C Entered By: Worthy Keeler on 09/20/2019 14:54:22 Liebert, [name] ([id number]) -------------------------------------------------------------------------------- SuperBill Details Patient Name: [name] Date of Service: 09/20/2019 Medical Record Number: [id number] Patient Account Number: [id number] Date of Birth/Sex: May 13, 1981 (39 y.o. M) Treating RN: Army Melia Primary Care Provider: [name] Other Clinician: Referring Provider: [name] Treating Provider/Extender: Melburn Hake, Zach Tietje Weeks in Treatment: 145 Diagnosis Coding ICD-10 Codes Code Description [phone] Non-pressure chronic ulcer of left calf with fat layer exposed L88 Pyoderma gangrenosum I87.2 Venous insufficiency (chronic) (peripheral) L03.116 Cellulitis of left lower limb Facility Procedures CPT4 Code: [id number] Description: 18867 - WOUND CARE VISIT-LEV 3 EST PT Modifier: Quantity: 1 Physician Procedures CPT4 Code: 7373668 Description: 15947 - WC PHYS LEVEL 3 - EST PT Modifier: Quantity: 1 CPT4 Code: Description: ICD-10 Diagnosis Description L97.222 Non-pressure chronic ulcer of left calf with fat layer exposed L88 Pyoderma gangrenosum I87.2 Venous insufficiency (chronic) (peripheral) L03.116 Cellulitis of left lower limb Modifier: Quantity: Electronic Signature(s) Signed: 09/20/2019 2:54:35 PM By: Worthy Keeler PA-C Entered By: Worthy Keeler on 09/20/2019 14:54:35

## 2019-09-24 ENCOUNTER — Ambulatory Visit
Admit: 2019-09-24 | Discharge: 2019-09-25 | Payer: PRIVATE HEALTH INSURANCE | Attending: Dermatology | Primary: Dermatology

## 2019-09-24 NOTE — Unmapped (Signed)
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     Chronic illness with exacerbation, flaring    -patient amenable to plan to start infliximab 10mg /kg week 0, 2, 6, then q 8 weeks. Discussed risks of tuberculosis reactivation, other uncommon infections, theoretical risk of malignancies, and other uncommon side effects.   - stop humira when starting infliximab. Patient is scheduled to start 5/13 at [facility] [place]  - Patient now seeing Dr. Orvan Falconer, Infectious Disease, at [facility]. Defer antibiotic regiment. Patient currently off of doxycycline however is starting IV antibiotics for active infection w/ pseudomonas . I will call Dr. Orvan Falconer regarding starting infliximab during an active infection although patient is already currently on humira and infliximab ultimately is the treatment of choice for his disease.  - trental may be an option in the future but will await vascular consultation  - also discussed that medications like gabapentin may be an option to help with nerve pain. He will think about this and let us know.  -pt to also follow-up in dermatology wound clinic for additional considerations/second opinoin  -pt to continue to follow with his weekly local wound clinic    High risk medication use: currently on Humira with switch to Remicade  - quant gold negative 08/17/19 - up to date  - reviewed CBC and bun/cr, ast/alt 08/17/19 - stable  - hepatitis panel (HBV cAb / sAb and HCV) negative 07/2019 -- HBV non-immune  Discussed risks of infection, potential risk of malignancy, injection site reactions. No hx of CHF, MS, lupus, malignancy, hepatitis, TB    Return in about 6 weeks (around 11/05/2019) for IN the [place] location. or sooner as needed    CC:   Chief Complaint   Patient presents with   ??? Skin Check     FBSE, no areas of concern         HPI:  39 y.o. year old male seen today for f/up of his PG.     Concerns include:    Ulcer on lateral aspect of L leg. Present for years. Has been on many treatments in the past, with predominance of prednisone (from which he has secondary effects including diabetes). Currently on humira, started in Dec. Tolerating well but not sure if helping. Seeing [facility] weekly. Applying triamcinolone to wound base daily. Wearing OTC compression stockings, not sure if there is a numbered compression a/w these.     He was recently seen by [facility] vascular and determined to not have disease amenable to intervention.      No other new or changing lesions or areas of concern.     Disease history:  -Starting about 2013, he started to develop itchy bumps that enlarge when he scratches and picks at them. This initially affected just his lower legs, but more recently prior to initial presentation to our clinic, involvement spread to his thighs, buttocks, arms, and forearms.  -Areas start as tiny bumps that he then scratches. 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   -In spring 2018, an ulcer started on the L lateral calf and gradually enlarged, but was slowly healing and shrinking by the time  of presentation to our clinic. Denies any trauma at this site and notes that it started similar to other rashes on skin. Prior treatments include oral abx, including as recently as 12/2016 with courses of clinda and doxy, as well as topical steroids. He has h/o ulcerative colitis, now in remission without meds since 2015. Denies h/o liver or kidney disease or DM. Used to live in [place] when this started. Works for [employer] and is on feet all day, endorses swelling of legs. Denies new meds prior to onset.  - At his initial visit 01/22/2017, clobetasol ointment and doxycycline 100mg  bid x 4 weeks were started. A biopsy of a keratotic pruritic plaque was obtained. 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 Tissue culture grew 4+ MSSA. 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   - w/up for underlying pruritus (given patient report and difficulty not picking at the involved areas) on 10/17 revealed unremarkable HIV, hepatitis panel, LDH, TSH, free T4 . Outside slides from previous bx were reviewed by Dr. Eyvonne Left and showed venous stasis changes as well as a neutrophilic infiltrate that could be consistent with PG. He was started on topical clobetasol BID. He was continued on PO doxycycline for additional 6 weeks. His left leg ulcer continued to grow and the site of his previous biopsy on the posterior right leg has also developed a large non healing ulcer.   -10/18: w/up for underlying etiology of PG negative on 10/18: negative spep/upep, cbc w/diff consistently normal, and reports a known hx of ulcerative colitis which is in remssion  - Repeat biopsy as below done on 03/26/17 primarily c/w PG vs infectious with one potential organism on Fite stain but unclear if related.   - Patient started on prednisone on 04/01/17 with almost resolution by 7/19.  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 Stopped doxepin and gabapentin given lack of improvement  -rx sent for humira  02/2019 given worsening. Process started for renflexis in event of failure  -Evaluation by [facility] vascular - not thought to be amenable to procedural intervention    Pertinent PMH: No history of skin cancer   Ulcerative colitis, managed by [facility]  Join pain managed by [name]  HTN DM   Denies h/o liver, kidney abnormalities   Patient Active Problem List   Diagnosis   ??? Depression   ??? Hyperlipidemia   ??? Lower extremity edema   ??? Seasonal allergic rhinitis   ??? Skin ulcer of left calf with fat layer exposed (CMS-HCC)   ??? Pyoderma gangrenosum   ??? History of ulcerative colitis   ??? High risk medication use   ??? Ulcerative colitis (CMS-HCC)   ??? Type 2 diabetes mellitus (CMS-HCC)   ??? Venous stasis   ??? Normocytic anemia       FH: No family history of melanoma or chronic skin disease     SH: Lives in [facility] [place] [address]    ROS: Baseline state of health. 10 point ROS conducted and negative except as noted in HPI.    PE:  General: Well-developed, well-nourished, in no acute distress  Neuro: Alert and oriented, interacts appropriately  Mouth: not examined as patient wearing a mask  Ext: patient deferred examination  Skin: Per patient request, focused examination with inspection and palpation of the right upper extremity, left upper extremity, right lower extremity, left lower extremity, was performed and notable for the following. All other areas examined were normal or had no significant findings.  - ulceration of lateral left lower extremityp, to muscle , with violaceous rolled borders and surrounding violaceous area w/induration. Minimal exudate.   - scaling and erythema with orange-brown discoloration on both distal LE in the setting of LE edema

## 2019-09-26 NOTE — Unmapped (Signed)
Patient labs stable. Patient notified via mychart.

## 2019-09-27 ENCOUNTER — Other Ambulatory Visit: Payer: Self-pay

## 2019-09-27 ENCOUNTER — Encounter: Payer: 59 | Admitting: Physician Assistant

## 2019-09-27 DIAGNOSIS — L97222 Non-pressure chronic ulcer of left calf with fat layer exposed: Secondary | ICD-10-CM | POA: Diagnosis not present

## 2019-09-27 MED ORDER — PEN NEEDLE, DIABETIC 31 GAUGE X 15/64"
0 days
Start: 2019-09-27 — End: ?

## 2019-09-27 NOTE — Progress Notes (Signed)
JUSTUS, DROKE ([id number]) Visit Report for 09/27/2019 Arrival Information Details Patient Name: [name], [name] Date of Service: 09/27/2019 8:15 AM Medical Record Number: [id number] Patient Account Number: [id number] Date of Birth/Sex: 1981/03/09 (39 y.o. M) Treating RN: Montey Hora Primary Care Liller Yohn: [name] Other Clinician: Referring Damascus Feldpausch: [name] Treating Tayna Smethurst/Extender: Melburn Hake, [name]: 47 Visit Information History Since Last Visit Added or deleted any medications: No Patient Arrived: Ambulatory Any new allergies or adverse reactions: No Arrival Time: 08:22 Had a fall or experienced change in No Accompanied By: self activities of daily living that may affect Transfer Assistance: None risk of falls: Patient Identification Verified: Yes Signs or symptoms of abuse/neglect since last visito No Secondary Verification Process Completed: Yes Hospitalized since last visit: No Patient Requires Transmission-Based No Implantable device outside of the clinic excluding No Precautions: cellular tissue based products placed in the center Patient Has Alerts: Yes since last visit: Patient Alerts: 08/09/19 Picc Line Has Dressing in Place as Prescribed: Yes Right Has Compression in Place as Prescribed: Yes Pain Present Now: No Electronic Signature(s) Signed: 09/27/2019 4:26:48 PM By: Montey Hora Entered By: Montey Hora on 09/27/2019 08:23:20 [name] ([id number]) -------------------------------------------------------------------------------- Clinic Level of Care Assessment Details Patient Name: [name] Date of Service: 09/27/2019 8:15 AM Medical Record Number: [id number] Patient Account Number: [id number] Date of Birth/Sex: 04/03/1981 (39 y.o. M) Treating RN: Army Melia Primary Care Kinzy Weyers: [name] Other Clinician: Referring Amrit Erck: [name] Treating Nialah Saravia/Extender: Melburn Hake, HOYT Weeks in  Treatment: 146 Clinic Level of Care Assessment Items TOOL 4 Quantity Score []  - Use when only an EandM is performed on FOLLOW-UP visit 0 ASSESSMENTS - Nursing Assessment / Reassessment X - Reassessment of Co-morbidities (includes updates in patient status) 1 10 X- 1 5 Reassessment of Adherence to Treatment Plan ASSESSMENTS - Wound and Skin Assessment / Reassessment X - Simple Wound Assessment / Reassessment - one wound 1 5 []  - 0 Complex Wound Assessment / Reassessment - multiple wounds []  - 0 Dermatologic / Skin Assessment (not related to wound area) ASSESSMENTS - Focused Assessment []  - Circumferential Edema Measurements - multi extremities 0 []  - 0 Nutritional Assessment / Counseling / Intervention []  - 0 Lower Extremity Assessment (monofilament, tuning fork, pulses) []  - 0 Peripheral Arterial Disease Assessment (using hand held doppler) ASSESSMENTS - Ostomy and/or Continence Assessment and Care []  - Incontinence Assessment and Management 0 []  - 0 Ostomy Care Assessment and Management (repouching, etc.) PROCESS - Coordination of Care X - Simple Patient / Family Education for ongoing care 1 15 []  - 0 Complex (extensive) Patient / Family Education for ongoing care []  - 0 Staff obtains Programmer, systems, Records, Test Results / Process Orders []  - 0 Staff telephones HHA, Nursing Homes / Clarify orders / etc []  - 0 Routine Transfer to another Facility (non-emergent condition) []  - 0 Routine Hospital Admission (non-emergent condition) []  - 0 New Admissions / Biomedical engineer / Ordering NPWT, Apligraf, etc. []  - 0 Emergency Hospital Admission (emergent condition) X- 1 10 Simple Discharge Coordination []  - 0 Complex (extensive) Discharge Coordination PROCESS - Special Needs []  - Pediatric / Minor Patient Management 0 []  - 0 Isolation Patient Management []  - 0 Hearing / Language / Visual special needs []  - 0 Assessment of Community assistance (transportation, D/C  planning, etc.) []  - 0 Additional assistance / Altered mentation []  - 0 Support Surface(s) Assessment (bed, cushion, seat, etc.) INTERVENTIONS - Wound Cleansing / Measurement [name], [name] ([id number]) X- 1 5 Simple Wound Cleansing -  one wound []  - 0 Complex Wound Cleansing - multiple wounds X- 1 5 Wound Imaging (photographs - any number of wounds) []  - 0 Wound Tracing (instead of photographs) X- 1 5 Simple Wound Measurement - one wound []  - 0 Complex Wound Measurement - multiple wounds INTERVENTIONS - Wound Dressings []  - Small Wound Dressing one or multiple wounds 0 X- 1 15 Medium Wound Dressing one or multiple wounds []  - 0 Large Wound Dressing one or multiple wounds []  - 0 Application of Medications - topical []  - 0 Application of Medications - injection INTERVENTIONS - Miscellaneous []  - External ear exam 0 []  - 0 Specimen Collection (cultures, biopsies, blood, body fluids, etc.) []  - 0 Specimen(s) / Culture(s) sent or taken to Lab for analysis []  - 0 Patient Transfer (multiple staff / Civil Service fast streamer / Similar devices) []  - 0 Simple Staple / Suture removal (25 or less) []  - 0 Complex Staple / Suture removal (26 or more) []  - 0 Hypo / Hyperglycemic Management (close monitor of Blood Glucose) []  - 0 Ankle / Brachial Index (ABI) - do not check if billed separately X- 1 5 Vital Signs Has the patient been seen at the hospital within the last three years: Yes Total Score: 80 Level Of Care: New/Established - Level 3 Electronic Signature(s) Signed: 09/27/2019 9:21:34 AM By: Army Melia Entered By: Army Melia on 09/27/2019 08:48:57 [name] ([id number]) -------------------------------------------------------------------------------- Encounter Discharge Information Details Patient Name: [name] Date of Service: 09/27/2019 8:15 AM Medical Record Number: [id number] Patient Account Number: [id number] Date of Birth/Sex: Jul 30, 1980 (39 y.o. M) Treating RN: Army Melia Primary Care Malayiah Mcbrayer: [name] Other Clinician: Referring Cleon Signorelli: [name] Treating Kristapher [place]/Extender: Melburn Hake, [name]: 146 Encounter Discharge Information Items Discharge Condition: Stable Ambulatory Status: Ambulatory Discharge Destination: Home Transportation: Private Auto Accompanied By: self Schedule Follow-up Appointment: Yes Clinical Summary of Care: Electronic Signature(s) Signed: 09/27/2019 9:21:34 AM By: Army Melia Entered By: Army Melia on 09/27/2019 08:49:32 [name] ([id number]) -------------------------------------------------------------------------------- Lower Extremity Assessment Details Patient Name: [name] Date of Service: 09/27/2019 8:15 AM Medical Record Number: [id number] Patient Account Number: [id number] Date of Birth/Sex: 10/31/1980 (39 y.o. M) Treating RN: Montey Hora Primary Care Aiyana Stegmann: [name] Other Clinician: Referring Britaney Espaillat: [name] Treating Wm Sahagun/Extender: Melburn Hake, [name]: 146 Edema Assessment Assessed: [Left: No] [Right: No] Edema: [Left: Ye] [Right: s] Vascular Assessment Pulses: Dorsalis Pedis Palpable: [Left:Yes] Electronic Signature(s) Signed: 09/27/2019 4:26:48 PM By: Montey Hora Entered By: Montey Hora on 09/27/2019 08:29:27 [name] ([id number]) -------------------------------------------------------------------------------- Multi Wound Chart Details Patient Name: [name] Date of Service: 09/27/2019 8:15 AM Medical Record Number: [id number] Patient Account Number: [id number] Date of Birth/Sex: 1981/02/28 (39 y.o. M) Treating RN: Army Melia Primary Care Burma Ketcher: [name] Other Clinician: Referring Lionell Matuszak: [name] Treating Tylie Golonka/Extender: Melburn Hake, [name]: 146 Vital Signs Height(in): 71 Pulse(bpm): 81 Weight(lbs): 338 Blood Pressure(mmHg): 124/74 Body Mass Index(BMI):  47 Temperature(F): 98.8 Respiratory Rate(breaths/min): 16 Photos: [N/A:N/A] Wound Location: Left, Lateral Lower Leg N/A N/A Wounding Event: Gradually Appeared N/A N/A Primary Etiology: Pyoderma N/A N/A Comorbid History: Sleep Apnea, Hypertension, Colitis N/A N/A Date Acquired: 11/18/2015 N/A N/A Weeks of Treatment: 146 N/A N/A Wound Status: Open N/A N/A Measurements L x W x D (cm) 7.2x7.6x1.2 N/A N/A Area (cm) : 42.977 N/A N/A Volume (cm) : 51.572 N/A N/A % Reduction in Area: -775.50% N/A N/A % Reduction in Volume: -1213.30% N/A N/A Classification: Full Thickness With Exposed N/A N/A Support Structures Exudate  Amount: Medium N/A N/A Exudate Type: Purulent N/A N/A Exudate Color: yellow, brown, green N/A N/A Wound Margin: Epibole N/A N/A Granulation Amount: Large (67-100%) N/A N/A Granulation Quality: Pink N/A N/A Necrotic Amount: Small (1-33%) N/A N/A Exposed Structures: Fat Layer (Subcutaneous Tissue) N/A N/A Exposed: Yes Muscle: Yes Fascia: No Tendon: No Joint: No Bone: No Epithelialization: None N/A N/A Treatment Notes Electronic Signature(s) Signed: 09/27/2019 9:21:34 AM By: Army Melia Entered By: Army Melia on 09/27/2019 08:47:29 [name] ([id number]) -------------------------------------------------------------------------------- Multi-Disciplinary Care Plan Details Patient Name: [name] Date of Service: 09/27/2019 8:15 AM Medical Record Number: [id number] Patient Account Number: [id number] Date of Birth/Sex: Apr 04, 1981 (39 y.o. M) Treating RN: Army Melia Primary Care Ronn Smolinsky: [name] Other Clinician: Referring Jannat Rosemeyer: [name] Treating Shatira Dobosz/Extender: Melburn Hake, [name]: 146 Active Inactive Venous Leg Ulcer Nursing Diagnoses: Knowledge deficit related to disease process and management Potential for venous Insuffiency (use before diagnosis confirmed) Goals: Non-invasive venous studies are completed as  ordered Date Initiated: 12/11/2016 Target Resolution Date: 03/13/2017 Goal Status: Active Patient will maintain optimal edema control Date Initiated: 12/11/2016 Target Resolution Date: 03/13/2017 Goal Status: Active Patient/caregiver will verbalize understanding of disease process and disease management Date Initiated: 12/11/2016 Target Resolution Date: 03/13/2017 Goal Status: Active Verify adequate tissue perfusion prior to therapeutic compression application Date Initiated: 12/11/2016 Target Resolution Date: 03/13/2017 Goal Status: Active Interventions: Assess peripheral edema status every visit. Compression as ordered Provide education on venous insufficiency Treatment Activities: Therapeutic compression applied : 12/11/2016 Notes: Wound/Skin Impairment Nursing Diagnoses: Impaired tissue integrity Knowledge deficit related to ulceration/compromised skin integrity Goals: Patient/caregiver will verbalize understanding of skin care regimen Date Initiated: 12/11/2016 Target Resolution Date: 03/13/2017 Goal Status: Active Ulcer/skin breakdown will have a volume reduction of 30% by week 4 Date Initiated: 12/11/2016 Target Resolution Date: 03/13/2017 Goal Status: Active Ulcer/skin breakdown will have a volume reduction of 50% by week 8 Date Initiated: 12/11/2016 Target Resolution Date: 03/13/2017 Goal Status: Active Ulcer/skin breakdown will have a volume reduction of 80% by week 12 Date Initiated: 12/11/2016 Target Resolution Date: 03/13/2017 Goal Status: Active Ulcer/skin breakdown will heal within 14 weeks Date Initiated: 12/11/2016 Target Resolution Date: 03/13/2017 Goal Status: Active [name], [name] ([id number]) Interventions: Assess patient/caregiver ability to obtain necessary supplies Assess patient/caregiver ability to perform ulcer/skin care regimen upon admission and as needed Assess ulceration(s) every visit Provide education on ulcer and skin care Treatment  Activities: Skin care regimen initiated : 12/11/2016 Topical wound management initiated : 12/11/2016 Notes: Electronic Signature(s) Signed: 09/27/2019 9:21:34 AM By: Army Melia Entered By: Army Melia on 09/27/2019 08:47:15 [name], [name] ([id number]) -------------------------------------------------------------------------------- Pain Assessment Details Patient Name: [name] Date of Service: 09/27/2019 8:15 AM Medical Record Number: [id number] Patient Account Number: [id number] Date of Birth/Sex: 11/08/1980 (39 y.o. M) Treating RN: Montey Hora Primary Care Cree Napoli: [name] Other Clinician: Referring Viha Kriegel: [name] Treating Malerie Eakins/Extender: Melburn Hake, [name]: 146 Active Problems Location of Pain Severity and Description of Pain Patient Has Paino No Site Locations Pain Management and Medication Current Pain Management: Electronic Signature(s) Signed: 09/27/2019 4:26:48 PM By: Montey Hora Entered By: Montey Hora on 09/27/2019 08:23:29 [name] ([id number]) -------------------------------------------------------------------------------- Patient/Caregiver Education Details Patient Name: [name] Date of Service: 09/27/2019 8:15 AM Medical Record Number: [id number] Patient Account Number: [id number] Date of Birth/Gender: 1980-10-29 (39 y.o. M) Treating RN: Army Melia Primary Care Physician: [name] Other Clinician: Referring Physician: [name] Treating Physician/Extender: Sharalyn Ink in Treatment: 146 Education Assessment Education Provided To: Patient Education Topics Provided  Wound/Skin Impairment: Handouts: Caring for Your Ulcer Methods: Demonstration, Explain/Verbal Responses: State content correctly Electronic Signature(s) Signed: 09/27/2019 9:21:34 AM By: Army Melia Entered By: Army Melia on 09/27/2019 08:49:08 [name]  ([id number]) -------------------------------------------------------------------------------- Wound Assessment Details Patient Name: [name] Date of Service: 09/27/2019 8:15 AM Medical Record Number: [id number] Patient Account Number: [id number] Date of Birth/Sex: 02/23/1981 (39 y.o. M) Treating RN: Montey Hora Primary Care Shimeka Bacot: [name] Other Clinician: Referring Alean Kromer: [name] Treating Sims Laday/Extender: Melburn Hake, [name]: 146 Wound Status Wound Number: 1 Primary Etiology: Pyoderma Wound Location: Left, Lateral Lower Leg Wound Status: Open Wounding Event: Gradually Appeared Comorbid History: Sleep Apnea, Hypertension, Colitis Date Acquired: 11/18/2015 Weeks Of Treatment: 146 Clustered Wound: No Photos Wound Measurements Length: (cm) 7.2 Width: (cm) 7.6 Depth: (cm) 1.2 Area: (cm) 42.977 Volume: (cm) 51.572 % Reduction in Area: -775.5% % Reduction in Volume: -1213.3% Epithelialization: None Tunneling: No Undermining: No Wound Description Classification: Full Thickness With Exposed Support Structures Wound Margin: Epibole Exudate Amount: Medium Exudate Type: Purulent Exudate Color: yellow, brown, green Foul Odor After Cleansing: No Slough/Fibrino Yes Wound Bed Granulation Amount: Large (67-100%) Exposed Structure Granulation Quality: Pink Fascia Exposed: No Necrotic Amount: Small (1-33%) Fat Layer (Subcutaneous Tissue) Exposed: Yes Necrotic Quality: Adherent Slough Tendon Exposed: No Muscle Exposed: Yes Necrosis of Muscle: No Joint Exposed: No Bone Exposed: No Treatment Notes Wound #1 (Left, Lateral Lower Leg) Notes gentamycin, BFD Blades, [name] ([id number]) Electronic Signature(s) Signed: 09/27/2019 4:26:48 PM By: Montey Hora Entered By: Montey Hora on 09/27/2019 08:28:36 [name] ([id number]) -------------------------------------------------------------------------------- Vitals Details Patient  Name: [name] Date of Service: 09/27/2019 8:15 AM Medical Record Number: [id number] Patient Account Number: [id number] Date of Birth/Sex: 1980-08-30 (39 y.o. M) Treating RN: Montey Hora Primary Care Nasiah Polinsky: [name] Other Clinician: Referring Jasha Hodzic: [name] Treating Yessenia Maillet/Extender: Melburn Hake, [name]: 146 Vital Signs Time Taken: 08:23 Temperature (F): 98.8 Height (in): 71 Pulse (bpm): 81 Weight (lbs): 338 Respiratory Rate (breaths/min): 16 Body Mass Index (BMI): 47.1 Blood Pressure (mmHg): 124/74 Reference Range: 80 - 120 mg / dl Electronic Signature(s) Signed: 09/27/2019 4:26:48 PM By: Montey Hora Entered By: Montey Hora on 09/27/2019 08:25:07

## 2019-09-28 ENCOUNTER — Other Ambulatory Visit: Payer: Self-pay | Admitting: Primary Care

## 2019-09-28 NOTE — Progress Notes (Signed)
KALIQ, LEGE ([id number]) Visit Report for 09/27/2019 Chief Complaint Document Details Patient Name: [name], [name] Date of Service: 09/27/2019 8:15 AM Medical Record Number: [id number] Patient Account Number: [id number] Date of Birth/Sex: 1980/11/17 (39 y.o. M) Treating RN: Army Melia Primary Care Provider: [name] Other Clinician: Referring Provider: [name] Treating Provider/Extender: Melburn Hake, Jakirah Zaun Weeks in Treatment: 146 Information Obtained from: Patient Chief Complaint He is here in follow up evaluation for LLE pyoderma ulcer Electronic Signature(s) Signed: 09/27/2019 10:02:26 AM By: Worthy Keeler PA-C Entered By: Worthy Keeler on 09/27/2019 10:02:26 DANNER, PAULDING ([id number]) -------------------------------------------------------------------------------- HPI Details Patient Name: [name] Date of Service: 09/27/2019 8:15 AM Medical Record Number: [id number] Patient Account Number: [id number] Date of Birth/Sex: 08/07/1980 (39 y.o. M) Treating RN: Army Melia Primary Care Provider: [name] Other Clinician: Referring Provider: [name] Treating Provider/Extender: Melburn Hake, Lorelle Macaluso Weeks in Treatment: 146 History of Present Illness HPI Description: 12/04/16; 39 year old man who comes into the clinic today for review of a wound on the posterior left calf. He tells me that is been there for about a year. He is not a diabetic he does smoke half a pack per day. He was seen in the ER on 11/20/16 felt to have cellulitis around the wound and was given clindamycin. An x-ray did not show osteomyelitis. The patient initially tells me that he has a milk allergy that sets off a pruritic itching rash on his lower legs which she scratches incessantly and he thinks that's what may have set up the wound. He has been using various topical antibiotics and ointments without any effect. He works in a [employer] and is on his feet all day. He does not have a  prior history of wounds however he does have the rash on both lower legs the right arm and the ventral aspect of his left arm. These are excoriations and clearly have had scratching however there are of macular looking areas on both legs including a substantial larger area on the right leg. This does not have an underlying open area. There is no blistering. The patient tells me that 2 years ago in [place] in response to the rash on his legs he saw a dermatologist who told him he had a condition which may be pyoderma gangrenosum although I may be putting words into his mouth. He seemed to recognize this. 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 He is not in any treatment for this. He's had no recent travel 12/11/16; the patient arrives today with his wound and roughly the same condition we've been using silver alginate this is a deep punched out wound with some surrounding erythema but no tenderness. Biopsy I did did not show confirmed pyoderma gangrenosum suggested nonspecific inflammation and vasculitis but does not provide an actual description of what was seen by the pathologist. I'm really not able to understand this We have also received information from the patient's dermatologist in [place] notes from April 2016. This was a doctor Agarwal-antal. 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 He was prescribed topical steroid high potency under occlusion which helped but at this point the patient did not have a deep punched out wound. 12/18/16; the patient's wound is larger in terms of surface area however this surface looks better and there is less depth. The surrounding erythema also is better. The patient states that the wrap we put on came off 2 days ago when he has been using his  compression stockings. He we are in the process of getting a dermatology consult. 12/26/16 on evaluation today patient's left lower extremity wound shows evidence of  infection with surrounding erythema noted. He has been tolerating the dressing changes but states that he has noted more discomfort. There is a larger area of erythema surrounding the wound. No fevers, chills, nausea, or vomiting noted at this time. With that being said the wound still does have slough covering the surface. He is not allergic to any medication that he is aware of at this point. In regard to his right lower extremity he had several regions that are erythematous and pruritic he wonders if there's anything we can do to help that. 01/02/17 I reviewed patient's wound culture which was obtained his visit last week. He was placed on doxycycline at that point. Unfortunately that does not appear to be an antibiotic that would likely help with the situation however the pseudomonas noted on culture is sensitive to Cipro. Also unfortunately patient's wound seems to have a large compared to last week's evaluation. Not severely so but there are definitely increased measurements in general. He is continuing to have discomfort as well he writes this to be a seven out of 10. In fact he would prefer me not to perform any debridement today due to the fact that he is having discomfort and considering he has an active infection on the little reluctant to do so anyway. No fevers, chills, nausea, or vomiting noted at this time. 01/08/17; patient seems dermatology on September 5. I suspect dermatology will want the slides from the biopsy I did sent to their pathologist. I'm not sure if there is a way we can expedite that. In any case the culture I did before I left on vacation 3 weeks ago showed Pseudomonas he was given 10 days of Cipro and per her description of her intake nurses is actually somewhat better this week although the wound is quite a bit bigger than I remember the last time I saw this. He still has 3 more days of Cipro 01/21/17; dermatology appointment tomorrow. He has completed the ciprofloxacin  for Pseudomonas. Surface of the wound looks better however he is had some deterioration in the lesions on his right leg. Meantime the left lateral leg wound we will continue with sample 01/29/17; patient had his dermatology appointment but I can't yet see that note. He is completed his antibiotics. The wound is more superficial but considerably larger in circumferential area than when he came in. This is in his left lateral calf. He also has swollen erythematous areas with superficial wounds on the right leg and small papular areas on both arms. There apparently areas in her his upper thighs and buttocks I did not look at those. Dermatology biopsied the right leg. Hopefully will have their input next week. 02/05/17; patient went back to see his dermatologist who told him that he had a "scratching problem" as well as staph. He is now on a 30 day course of doxycycline and I believe she gave him triamcinolone cream to the right leg areas to help with the itching [not exactly sure but probably triamcinolone]. She apparently looked at the left lateral leg wound although this was not rebiopsied and I think felt to be ultimately part of the same pathogenesis. He is using sample border foam and changing nevus himself. He now has a new open area on the right posterior leg which was his biopsy site I don't have any of the  dermatology notes 02/12/17; we put the patient in compression last week with SANTYL to the wound on the left leg and the biopsy. Edema is much better and the depth of the wound is now at level of skin. Area is still the same oBiopsy site on the right lateral leg we've also been using santyl with a border foam dressing and he is changing this himself. 02/19/17; Using silver alginate started last week to both the substantial left leg wound and the biopsy site on the right wound. He is tolerating compression well. Has a an appointment with his primary M.D. tomorrow wondering about diuretics although  I'm wondering if the edema problem is actually lymphedema 02/26/17; the patient has been to see his primary doctor Dr. Jerrel Ivory at [facility] our primary care. She started him on Lasix 20 mg and this seems to have helped with the edema. However we are not making substantial change with the left lateral calf wound and inflammation. The biopsy site on the right leg also looks stable but not really all that different. 03/12/17; the patient has been to see vein and vascular Dr. Lucky Cowboy. He has had venous reflux studies I have not reviewed these. I did get a call from his dermatology office. They felt that he might have pathergy based on their biopsy on his right leg which led them to look at the slides of JAQUAVIOUS, MERCER ([id number]) the biopsy I did on the left leg and they wonder whether this represents pyoderma gangrenosum which was the original supposition in a man with ulcerative colitis albeit inactive for many years. They therefore recommended clobetasol and tetracycline i.e. 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 03/26/17; apparently the patient just had reflux studies not an appointment with Dr. dew. She arrives in clinic today having applied clobetasol for 2-3 weeks. He notes over the last 2-3 days excessive drainage having to change the dressing 3-4 times a day and also expanding erythema. 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 Lillia Carmel at [facility] dermatology locally who kindly saw him at my request. 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 My concern was the degree of erythema medially extending from his left leg wound which was either inflammation from pyoderma  or cellulitis. I put him on Augmentin however culture of the wound showed Pseudomonas which is quinolone sensitive. I really don't believe he has cellulitis however in view of everything I will continue and give him a course of Cipro. He is also on doxycycline as an immune modulator for the pyoderma. In addition to his original wound on the left lateral leg with surrounding erythema he has a wound on the right posterior calf which was an original biopsy site done by dermatology. 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 Saw Dr. Lillia Carmel yesterday. He has been using topical antibiotics to both wound areas his original wound on the left and the biopsies/pathergy area on the right. There is definitely some improvement in the inflammation around the wound on the right although the patient states he has increasing sensitivity of the wounds. He is on prednisone 60 and doxycycline 1 as prescribed by Dr. Lillia Carmel. He is covering the topical antibiotic  with gauze and putting this in his own compression stocks and changing this daily. He states that Dr. Lottie Rater did a culture of the left leg wound yesterday 05/07/17; pyoderma gangrenosum. The patient saw Dr. Lillia Carmel yesterday and has a follow-up with her in one month. He is still using topical antibiotics to both wounds although he can't recall exactly what type. He is still on prednisone 60 mg. Dr. Lillia Carmel stated that the doxycycline could stop if we were in agreement. 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 The right medial leg was induced by biopsy/pathergy. The area on the right is essentially healed. 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 The right medial leg has closed and remains closed. He  is still on prednisone 60. oHe tells me he missed his last dermatology appointment with Dr. Lillia Carmel but will make another appointment. He reports that her blood sugar at a recent screen in [place] was high 200's. He was 180 today. 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 I think he is going to require still much longer prednisone perhaps another 3 months before attempting to taper. In the meantime his wound is a lot better. Smaller. He is cleaning this off daily and applying topical antibiotics. When he was last in the clinic I thought about changing to [facility] and actually put in a couple of calls to dermatology although probably not during their business hours. 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 He continues on oral prednisone. He has been using triple antibiotic ointment. There is surface debris and we will transition to [facility] and have him return in 2 weeks. He has lost 30 pounds since his last appointment with lifestyle modification. He may benefit from topical steroid cream for treatment this can be considered at a later date. 08/22/17 on evaluation today patient appears to actually be doing rather well in regard to his left lateral lower extremity ulcer. He has actually been managed by Dr. Dellia Nims most recently. Patient is currently on oral steroids at this time. This seems to have been of benefit for him. Nonetheless his last visit was actually with Leah on 08/06/17. Currently he is not utilizing any topical steroid creams although this could be of benefit as well. No fevers, chills, nausea, or vomiting noted at this time. 09/05/17 on evaluation today patient appears to be doing better in regard to his left lateral lower extremity ulcer. He has been tolerating the dressing changes without complication. He is using Santyl with good effect. Overall I'm  very pleased with how things are standing at this point. Patient likewise is happy that this is doing better. 09/19/17 on evaluation today patient actually appears to be doing rather well in regard to his left lateral lower extremity ulcer. 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 He's not having any significant pain. 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 He has been tolerating the Santyl without complication and in general I feel like he's making good progress. 10/17/17 on evaluation  today patient appears to be doing very well in regard to his left lateral lower surety ulcer. He has been tolerating the dressing changes without complication. There does not appear to be any evidence of infection he's alternating the Santyl and the triple antibiotic ointment every other day this seems to be doing well for him. 11/03/17 on evaluation today patient appears to be doing very well in regard to his left lateral lower extremity ulcer. He is been tolerating the dressing changes without complication which is good news. Fortunately there does not appear to be any evidence of infection which is also great news. Overall is doing excellent they are starting to taper down on the prednisone is down to 40 mg at this point it also started topical clobetasol for him. 11/17/17 on evaluation today patient appears to be doing well in regard to his left lateral lower surety ulcer. He's been tolerating the dressing changes without complication. He does note that he is having no pain, no excessive drainage or discharge, and overall he feels like things are going about how he would expect and hope they would. Overall he seems to have no evidence of infection at this time in my opinion which is good news. 12/04/17-He is seen in follow-up  evaluation for right lateral lower extremity ulcer. He has been applying topical steroid cream. Today's measurement show slight increase in size. Over the next 2 weeks we will transition to every other day Santyl and steroid cream. He has been encouraged to monitor for changes and notify clinic with any concerns 12/15/17 on evaluation today patient's left lateral motion the ulcer and fortunately is doing worse again at this point. This just since last week to this week has close to doubled in size according to the patient. I did not seeing last week's I do not have a visual to compare this to in our system was also down so we do not have all the charts and at this point. Nonetheless it does have me somewhat concerned in regard to the fact that again he was worried enough about it he has contact the dermatology that placed them back on the full strength, 50 mg a day of the prednisone that he was taken previous. He continues to alternate using clobetasol along with Santyl at this point. He is obviously somewhat frustrated. 12/22/17 on evaluation today patient appears to be doing a little worse compared to last evaluation. Unfortunately the wound is a little deeper and slightly larger than the last week's evaluation. With that being said he has made some progress in regard to the irritation surrounding at this time unfortunately despite that progress that's been made he still has a significant issue going on here. 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 PRATHER, FAILLA ([id number]) For that reason I discussed with him today the possibility of perform a wound culture to ensure there's nothing overtly infected. 01/06/18 on evaluation today patient's wound is larger and deeper than previously evaluated. With that being said it did appear that his wound was  infected after my last evaluation with him. Subsequently I did end up prescribing a prescription for Bactrim DS which she has been taking and having no complication with. Fortunately there does not appear to be any evidence of infection at this point in time as far as anything spreading, no want to touch, and overall I  feel like things are showing signs of improvement. 01/13/18 on evaluation today patient appears to be even a little larger and deeper than last time. There still muscle exposed in the base of the wound. Nonetheless he does appear to be less erythematous I do believe inflammation is calming down also believe the infection looks like it's probably resolved at this time based on what I'm seeing. No fevers, chills, nausea, or vomiting noted at this time. 01/30/18 on evaluation today patient actually appears to visually look better for the most part. Unfortunately those visually this looks better he does seem to potentially have what may be an abscess in the muscle that has been noted in the central portion of the wound. This is the first time that I have noted what appears to be fluctuance in the central portion of the muscle. With that being said I'm somewhat more concerned about the fact that this might indicate an abscess formation at this location. I do believe that an ultrasound would be appropriate. This is likely something we need to try to do as soon as possible. He has been switch to mupirocin ointment and he is no longer using the steroid ointment as prescribed by dermatology he sees them again next week he's been decreased from 60 to 40 mg of prednisone. 03/09/18 on evaluation today patient actually appears to be doing a little better compared to last time I saw him. There's not as much erythema surrounding the wound itself. He I did review his most recent infectious disease note which was dated 02/24/18. He saw Dr. Michel Bickers in [place]. 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 Patient is now off of antibiotics and they are continually observing this. There seems to be no change in the past two weeks in my pinion based on what the patient says and what I see today compared to what Dr. Megan Salon likely saw two weeks ago. No fevers, chills, nausea, or vomiting noted at this time. 03/23/18 on evaluation today patient's wound actually appears to be showing signs of improvement which is good news. He is currently still on the Dapsone. He is also working on tapering the prednisone to get off of this and Dr. Lottie Rater is working with him in this regard. Nonetheless overall I feel like the wound is doing well it does appear based on the infectious disease note that I reviewed from Dr. 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 I did also review the results of his ultrasound of left lower extremity which revealed there was a dentist tissue in the base of the wound without an abscess noted. 04/06/18 on evaluation today the patient's left lateral lower extremity ulcer actually appears to be doing fairly well which is excellent news. There does not appear to be any evidence of infection at this time which is also great news. Overall he still does have a significantly large ulceration although little by little he seems to be making progress. He is down to 10 mg a day of the prednisone. 04/20/18 on evaluation today patient actually appears to be doing excellent at this time in regard to his left lower extremity ulcer. He's making signs of good progress unfortunately this is taking much longer than we would really like to see but nonetheless  he is making progress. Fortunately there does not appear to be any evidence of infection at this time. No fevers, chills, nausea, or vomiting noted at  this time. The patient has not been using the Santyl due to the cost he hadn't got in this field yet. He's mainly been using the antibiotic ointment topically. Subsequently he also tells me that he really has not been scrubbing in the shower I think this would be helpful again as I told him it doesn't have to be anything too aggressive to even make it believe just enough to keep it free of some of the loose slough and biofilm on the wound surface. 05/11/18 on evaluation today patient's wound appears to be making slow but sure progress in regard to the left lateral lower extremity ulcer. He is been tolerating the dressing changes without complication. Fortunately there does not appear to be any evidence of infection at this time. He is still just using triple antibiotic ointment along with clobetasol occasionally over the area. He never got the Santyl and really does not seem to intend to in my pinion. 06/01/18 on evaluation today patient appears to be doing a little better in regard to his left lateral lower extremity ulcer. He states that overall he does not feel like he is doing as well with the Dapsone as he did with the prednisone. Nonetheless he sees his dermatologist later today and is gonna talk to them about the possibility of going back on the prednisone. Overall again I believe that the wound would be better if you would utilize Santyl but he really does not seem to be interested in going back to the [place] at this point. He has been using triple antibiotic ointment. 06/15/18 on evaluation today patient's wound actually appears to be doing about the same at this point. Fortunately there is no signs of infection at this time. He has made slight improvements although he continues to not really want to clean the wound bed at this point. He states that he just doesn't mess with it he doesn't want to cause any problems with everything else he has going on. He has been on medication, antibiotics  as prescribed by his dermatologist, for a staff infection of his lower extremities which is really drying out now and looking much better he tells me. Fortunately there is no sign of overall infection. 06/29/18 on evaluation today patient appears to be doing well in regard to his left lateral lower surety ulcer all things considering. Fortunately his staff infection seems to be greatly improved compared to previous. He has no signs of infection and this is drying up quite nicely. He is still the doxycycline for this is no longer on cental, Dapsone, or any of the other medications. His dermatologist has recommended possibility of an infusion but right now he does not want to proceed with that. 07/13/18 on evaluation today patient appears to be doing about the same in regard to his left lateral lower surety ulcer. Fortunately there's no signs of infection at this time which is great news. Unfortunately he still builds up a significant amount of Slough/biofilm of the surface of the wound he still is not really cleaning this as he should be appropriately. Again I'm able to easily with saline and gauze remove the majority of this on the surface which if you would do this at home would likely be a dramatic improvement for him as far as getting the area to improve. Nonetheless overall I still feel  like he is making progress is just very slow. I think Santyl will be of benefit for him as well. Still he has not gotten this as of this point. 07/27/18 on evaluation today patient actually appears to be doing little worse in regards of the erythema around the periwound region of the wound he also tells me that he's been having more drainage currently compared to what he was experiencing last time I saw him. He states not quite as bad as what he had because this was infected previously but nonetheless is still appears to be doing poorly. Fortunately there is no evidence of systemic infection at this point. The patient  tells me that he is not going to be able to afford the Santyl. He is still waiting to hear about the infusion therapy with his dermatologist. Apparently she wants an updated colonoscopy first. 08/10/18 on evaluation today patient appears to be doing better in regard to his left lateral lower extremity ulcer. Fortunately he is showing signs of improvement in this regard he's actually been approved for Remicade infusion's as well although this has not been scheduled as of yet. Fortunately there's no signs of active infection at this time in regard to the wound although he is having some issues with infection of the right lower extremity is been seen as dermatologist for this. Fortunately they are definitely still working with him trying to keep things under control. ASLAN, HIMES ([id number]) 09/07/18 on evaluation today patient is actually doing rather well in regard to his left lateral lower extremity ulcer. He notes these actually having some hair grow back on his extremity which is something he has not seen in years. He also tells me that the pain is really not giving them any trouble at this time which is also good news overall she is very pleased with the progress he's using a combination of the mupirocin along with the probate is all mixed. 09/21/18 on evaluation today patient actually appears to be doing fairly well all things considered in regard to his looks from the ulcer. He's been tolerating the dressing changes without complication. Fortunately there's no signs of active infection at this time which is good news he is still on all antibiotics or prevention of the staff infection. He has been on prednisone for time although he states it is gonna contact his dermatologist and see if she put them on a short course due to some irritation that he has going on currently. Fortunately there's no evidence of any overall worsening this is going very slow I think cental would be something that would be  helpful for him although he states that $50 for tube is quite expensive. He therefore is not willing to get that at this point. 10/06/18 on evaluation today patient actually appears to be doing decently well in regard to his left lateral leg ulcer. He's been tolerating the dressing changes without complication. Fortunately there's no signs of active infection at this time. Overall I'm actually rather pleased with the progress he's making although it's slow he doesn't show any signs of infection and he does seem to be making some improvement. I do believe that he may need a switch up and dressings to try to help this to heal more appropriately and quickly. 10/19/18 on evaluation today patient actually appears to be doing better in regard to his left lateral lower extremity ulcer. This is shown signs of having much less Slough buildup at this point due to the fact he has  been using the Santyl. Obviously this is very good news. The overall size of the wound is not dramatically smaller but again the appearance is. 11/02/18 on evaluation today patient actually appears to be doing quite well in regard to his lower [place] ulcer. A lot of the skin around the ulcer is actually somewhat irritating at this point this seems to be more due to the dressing causing irritation from the adhesive that anything else. Fortunately there is no signs of active infection at this time. 11/24/18 on evaluation today patient appears to be doing a little worse in regard to his overall appearance of his lower extremity ulcer. There's more erythema and warmth around the wound unfortunately. He is currently on doxycycline which he has been on for some time. With that being said I'm not sure that seems to be helping with what appears to possibly be an acute cellulitis with regard to his left lower extremity ulcer. No fevers, chills, nausea, or vomiting noted at this time. 12/08/18 on evaluation today patient's wounds actually appears to  be doing significantly better compared to his last evaluation. He has been using Santyl along with alternating tripling about appointment as well as the steroid cream seems to be doing quite well and the wound is showing signs of improvement which is excellent news. Fortunately there's no evidence of infection and in fact his culture came back negative with only normal skin flora noted. 12/21/2018 upon evaluation today patient actually appears to be doing excellent with regard to his ulcer. This is actually the best that I have seen it since have been helping to take care of him. It is both smaller as well as less slough noted on the surface of the wound and seems to be showing signs of good improvement with new skin growing from the edges. He has been using just the triamcinolone he does wonder if he can get a refill of that ointment today. 01/04/2019 upon evaluation today patient actually appears to be doing well with regard to his left lateral lower extremity ulcer. With that being said it does not appear to be that he is doing quite as well as last time as far as progression is concerned. There does not appear to be any signs of infection or significant irritation which is good news. With that being said I do believe that he may benefit from switching to a collagen based dressing based on how clean The wound appears. 01/18/2019 on evaluation today patient actually appears to be doing well with regard to his wound on the left lower extremity. He is not made a lot of progress compared to where we were previous but nonetheless does seem to be doing okay at this time which is good news. There is no signs of active infection which is also good news. My only concern currently is I do wish we can get him into utilizing the collagen dressing his insurance would not pay for the supplies that we ordered although it appears that he may be able to order this through his supply company that he typically utilizes.  This is [employer]. Nonetheless he did try to order it during the office visit today and it appears this did go through. We will see if he can get that it is a different brand but nonetheless he has collagen and I do think will be beneficial. 02/01/2019 on evaluation today patient actually appears to be doing a little worse today in regard to the overall size of his wounds.  Fortunately there is no signs of active infection at this time. That is visually. Nonetheless when this is happened before it was due to infection. For that reason were somewhat concerned about that this time as well. 02/08/2019 on evaluation today patient unfortunately appears to be doing slightly worse with regard to his wound upon evaluation today. Is measuring a little deeper and a little larger unfortunately. I am not really sure exactly what is causing this to enlarge he actually did see his dermatologist she is going to see about initiating Humira for him. Subsequently she also did do steroid injections into the wound itself in the periphery. Nonetheless still nonetheless he seems to be getting a little bit larger he is gone back to just using the steroid cream topically which I think is appropriate. I would say hold off on the collagen for the time being is definitely a good thing to do. Based on the culture results which we finally did get the final result back regarding it shows staph as the bacteria noted again that can be a normal skin bacteria based on the fact however he is having increased drainage and worsening of the wound measurement wise I would go ahead and place him on an antibiotic today I do believe for this. 02/15/2019 on evaluation today patient actually appears to be doing somewhat better in regard to his ulcer. There is no signs of worsening at this time I did review his culture results which showed evidence of Staphylococcus aureus but not MRSA. Again this could just be more related to the normal skin  bacteria although he states the drainage has slowed down quite a bit he may have had a mild infection not just colonization. And was much smaller and then since around10/04/2019 on evaluation today patient appears to be doing unfortunately worse as far as the size of the wound. I really feel like that this is steadily getting larger again it had been doing excellent right at the beginning of September we have seen a steady increase in the area of the wound it is almost 2-1/2 times the size it was on September 1. Obviously this is a bad trend this is not wanting to see. For that reason we went back to using just the topical triamcinolone cream which does seem to help with inflammation. I checked him for bacteria by way of culture and nothing showed positive there. I am considering giving him a short course of a tapering steroid Dosepak today to see if that is can be beneficial for him. The patient is in agreement with giving that a try. 03/08/2019 on evaluation today patient appears to be doing very well in comparison to last evaluation with regard to his lower extremity ulcer. This is showing signs of less inflammation and actually measuring slightly smaller compared to last time every other week over the past month and a half he has been measuring larger larger larger. Nonetheless I do believe that the issue has been inflammation the prednisone does seem to [facility], [place] ([id number]) have been beneficial for him which is good news. No fevers, chills, nausea, vomiting, or diarrhea. 03/22/2019 on evaluation today patient appears to be doing about the same with regard to his leg ulcer. He has been tolerating the dressing changes without complication. With that being said the wound seems to be mostly arrested at its current size but really is not making any progress except for when we prescribed the prednisone. He did show some signs of dropping as  far as the overall size of the wound during that interval  week. Nonetheless this is something he is not on long-term at this point and unfortunately I think he is getting need either this or else the Humira which his dermatologist has discussed try to get approval for. With that being said he will be seeing his dermatologist on the 11th of this month that is November. 04/19/2019 on evaluation today patient appears to be doing really about the same the wound is measuring slightly larger compared to last time I saw him. He has not been into the office since November 2 due to the fact that he unfortunately had Covid as that his entire family. He tells me that it was rough but they did pull-through and he seems to be doing much better. Fortunately there is no signs of active infection at this time. No fevers, chills, nausea, vomiting, or diarrhea. 05/10/2019 on evaluation today patient unfortunately appears to be doing significantly worse as compared to last time I saw him. He does tell me that he has had his first dose of Humira and actually is scheduled to get the next one in the upcoming week. With that being said he tells me also that in the past several days he has been having a lot of issues with green drainage she showed me a picture this is more blue-green in color. He is also been having issues with increased sloughy buildup and the wound does appear to be larger today. Obviously this is not the direction that we want everything to take based on the starting of his Humira. Nonetheless I think this is definitely a result of likely infection and to be honest I think this is probably Pseudomonas causing the infection based on what I am seeing. 05/24/2019 on evaluation today patient unfortunately appears to be doing significantly worse compared to his prior evaluation with me 2 weeks ago. I did review his culture results which showed that he does have Staph aureus as well as Pseudomonas noted on the culture. Nonetheless the Levaquin that I prescribed for him  does not appear to have been appropriate and in fact he tells me he is no longer experiencing the green drainage and discharge that he had at the last visit. Fortunately there is no signs of active infection at this time which is good news although the wound has significantly worsened it in fact is much deeper than it was previous. We have been utilizing up to this point triamcinolone ointment as the prescription topical of choice but at this time I really feel like that the wound is getting need to be packed in order to appropriately manage this due to the deeper nature of the wound. Therefore something along the lines of an alginate dressing may be more appropriate. 05/31/2019 upon inspection today patient's wound actually showed signs of doing poorly at this point. Unfortunately he just does not seem to be making any good progress despite what we have tried. He actually did go ahead and pick up the Cipro and start taking that as he was noticing more green drainage he had previously completed the Levaquin that I prescribed for him as well. Nonetheless he missed his appointment for the seventh last week on Wednesday with the wound care center and [facility] where his dermatologist referred him. Obviously I do think a second opinion would be helpful at this point especially in light of the fact that the patient seems to be doing so poorly despite  the fact that we have tried everything that I really know how at this point. The only thing that ever seems to have helped him in the past is when he was on high doses of continual steroids that did seem to make a difference for him. 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 06/07/2019 upon evaluation today patient unfortunately appears to be doing worse yet again with regard to his wound. In fact I  am starting to question whether or not he may have a fluid pocket in the muscle at this point based on the bulging and the soft appearance to the central portion of the muscle area. There is not anything draining from the muscle itself at this time which is good news but nonetheless the wound is expanding. I am not really seeing any results of the Humira as far as overall wound progression based on what I am seeing at this point. The patient has been referred for second opinion with regard to his wound to the [facility] wound care center by his dermatologist which I definitely am not in opposition to. Unfortunately we tried multiple dressings in the past including collagen, alginate, and at one point even Hydrofera Blue. With that being said he is never really used it for any significant amount of time due to the fact that he often complains of pain associated with these dressings and then will go back to either using the Santyl which she has done intermittently or more frequently the triamcinolone. He is also using his own compression stockings. We have wrapped him in the past but again that was something else that he really was not a big fan of. 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 He seemed to do much better when he was on the steroids although honestly I understand that the steroids are not necessarily the best medication to be on long-term obviously 06/14/2019 on evaluation today patient appears to be doing actually a little bit better with regard to the overall appearance with his leg. Unfortunately he does continue to have issues with what appears to be some fluid underneath  the muscle although he did see the wound specialty center at [facility] last week their main goals were to see about infusion therapy in place of the Humira as they feel like that is not quite strong enough. They also recommended that we continue with the treatment otherwise as we are they felt like that was appropriate and they are okay with him continuing to follow-up here with [place] in that regard. With that being said they are also sending him to the vein specialist there to see about vein stripping and if that would be of benefit for him. Subsequently they also did not really address whether or not an ultrasound of the muscle area to see if there is anything that needs to be addressed here would be appropriate or not. For that reason I discussed this with him last week I  think we may proceed down that road at this point. 06/21/2019 upon evaluation today patient's wound actually appears to be doing slightly better compared to previous evaluations. I do believe that he has made a difference with regard to the progression here with the use of oral steroids. Again in the past has been the only thing that is really calm things down. He does tell me that from [facility] is gotten a good news from there that there are no further vein stripping that is necessary at this point. I do not have that available for review today although the patient did relay this to me. He also did obtain and have the ultrasound of the wound completed which I did sign off on today. It does appear that there is no fluid collection under the muscle this is likely then just edematous tissue in general. That is also good news. Overall I still believe the inflammation is the main issue here. He did inquire about the possibility of a wound VAC again with the muscle protruding like it is I am not really sure whether the wound VAC is necessarily ideal or not. That is something we will have to consider although I do believe he may need compression  wrapping to try to help with edema control which could potentially be of benefit. 06/28/2019 on evaluation today patient appears to be doing slightly better measurement wise although this is not terribly smaller he least seems to be trending towards that direction. With that being said he still seems to have purulent drainage noted in the wound bed at this time. He has been on Levaquin followed by Cipro over the past month. Unfortunately he still seems to have some issues with active infection at this time. I did perform a culture last week in order to evaluate and see if indeed there was still anything going on. Subsequently the culture did come back showing Pseudomonas which is consistent with the drainage has been having which is blue-green in color. He also has had an odor that again was somewhat consistent with Pseudomonas as well. Long story short it appears that the culture showed an intermediate finding with regard to how well the Cipro will work for the Pseudomonas infection. Subsequently being that he does not seem to be clearing up and at best what we are doing is just keeping this at [place] I think he may need to see infectious disease to discuss IV antibiotic options. RAMEL, TOBON ([id number]) 07/05/2019 upon evaluation today patient appears to be doing okay in regard to his leg ulcer. He has been tolerating the dressing changes at this point without complication. Fortunately there is no signs of active infection at this time which is good news. No fevers, chills, nausea, vomiting, or diarrhea. With that being said he does have an appointment with infectious disease tomorrow and his primary care on Wednesday. Again the reason for the infectious disease referral was due to the fact that he did not seem to be fully resolving with the use of oral antibiotics and therefore we were thinking that IV antibiotic therapy may be necessary secondary to the fact that there was an intermediate finding for  how effective the Cipro may be. Nonetheless again he has been having a lot of purulent and even green drainage. Fortunately right now that seems to have calmed down over the past week with the reinitiation of the oral antibiotic. Nonetheless we will see what Dr. Megan Salon has to say. 07/12/2019 upon evaluation today patient appears  to be doing about the same at this point in regard to his left lower extremity ulcer. Fortunately there is no signs of active infection at this time which is good news I do believe the Levaquin has been beneficial I did review Dr. Hale Bogus note and to be honest I agree that the patient's leg does appear to be doing better currently. What we found in the past as he does not seem to really completely resolve he will stop the antibiotic and then subsequently things will revert back to having issues with blue-green drainage, increased pain, and overall worsening in general. Obviously that is the reason I sent him back to infectious disease. 07/19/2019 upon evaluation today patient appears to be doing roughly the same in size there is really no dramatic improvement. He has started back on the Levaquin at this point and though he seems to be doing okay he did still have a lot of blue/green drainage noted on evaluation today unfortunately. I think that this is still indicative more likely of a Pseudomonas infection as previously noted and again he does see Dr. Megan Salon in just a couple of days. I do not know that were really able to effectively clear this with just oral antibiotics alone based on what I am seeing currently. Nonetheless we are still continue to try to manage as best we can with regard to the patient and his wound. I do think the wrap was helpful in decreasing the edema which is excellent news. No fevers, chills, nausea, vomiting, or diarrhea. 07/26/2019 upon evaluation today patient appears to be doing slightly better with regard to the overall appearance of the muscle  there is no dark discoloration centrally. Fortunately there is no signs of active infection at this time. No fevers, chills, nausea, vomiting, or diarrhea. Patient's wound bed currently the patient did have an appointment with Dr. Megan Salon at infectious disease last week. With that being said Dr. Megan Salon the patient states was still somewhat hesitant about put him on any IV antibiotics he wanted [place] to repeat cultures today and then see where things go going forward. He does look like Dr. Megan Salon because of some improvement the patient did have with the Levaquin wanted [place] to see about repeating cultures. If it indeed grows the Pseudomonas again then he recommended a possibility of considering a PICC line placement and IV antibiotic therapy. He plans to see the patient back in 1 to 2 weeks. 08/02/2019 upon evaluation today patient appears to be doing poorly with regard to his left lower extremity. We did get the results of his culture back it shows that he is still showing evidence of Pseudomonas which is consistent with the purulent/blue-green drainage that he has currently. Subsequently the culture also shows that he now is showing resistance to the oral fluoroquinolones which is unfortunate as that was really the only thing to treat the infection prior. I do believe that he is looking like this is going require IV antibiotic therapy to get this under control. Fortunately there is no signs of systemic infection at this time which is good news. The patient does see Dr. Megan Salon tomorrow. 08/09/2019 upon evaluation today patient appears to be doing better with regard to his left lower extremity ulcer in regard to the overall appearance. He is currently on IV antibiotic therapy. As ordered by Dr. Megan Salon. Currently the patient is on ceftazidime which she is going to take for the next 2 weeks and then follow-up for 4 to 5-week appointment with  Dr. Megan Salon. The patient started this this past Friday  symptoms have not for a total of 3 days currently in full. 08/16/2019 upon evaluation today patient's wound actually does show muscle in the base of the wound but in general does appear to be much better as far as the overall evidence of infection is concerned. In fact I feel like this is for the most part cleared up he still on the IV antibiotics he has not completed the full course yet but I think he is doing much better which is excellent news. 08/23/2019 upon evaluation today patient appears to be doing about the same with regard to his wound at this point. He tells me that he still has pain unfortunately. Fortunately there is no evidence of systemic infection at this time which is great news. There is significant muscle protrusion. 09/13/19 upon evaluation today patient appears to be doing about the same in regard to his leg unfortunately. He still has a lot of drainage coming from the ulceration there is still muscle exposed. With that being said the patient's last wound culture still showed an intermediate finding with regard to the Pseudomonas he still having the bluish/green drainage as well. Overall I do not know that the wound has completely cleared of infection at this point. Fortunately there is no signs of active infection systemically at this point which is good news. 09/20/2019 upon evaluation today patient's wound actually appears to be doing about the same based on what I am seeing currently. I do not see any signs of systemic infection he still does have evidence of some local infection and drainage. He did see Dr. Megan Salon last week and Dr. Megan Salon states that he probably does need a different IV antibiotic although he does not want to put him on this until the patient begins the Remicade infusion which is actually scheduled for about 10 days out from today on 13 May. Following that time Dr. Megan Salon is good to see him back and then will evaluate the feasibility of starting him on the  IV antibiotic therapy once again at that point. I do not disagree with this plan I do believe as Dr. Megan Salon stated in his note that I reviewed today that the patient's issue is multifactorial with the pyoderma being 1 aspect of this that were hoping the Remicade will be helpful for her. In the meantime I think the gentamicin is, helping to keep things under decent okay control in regard to the ulcer. 09/27/2019 upon evaluation today patient appears to be doing about the same with regard to his wound still there is a lot of muscle exposure though he does have some hyper granulation tissue noted around the edge and actually some granulation tissue starting to form over the muscle which is actually good news. Fortunately there is no evidence of active infection which is also good news. His pain is less at this point. Electronic Signature(s) Signed: 09/27/2019 5:44:27 PM By: Worthy Keeler PA-C Entered By: Worthy Keeler on 09/27/2019 17:44:27 [name], [name] ([id number]) -------------------------------------------------------------------------------- Physical Exam Details Patient Name: [name] Date of Service: 09/27/2019 8:15 AM Medical Record Number: [id number] Patient Account Number: [id number] Date of Birth/Sex: Aug 12, 1980 (39 y.o. M) Treating RN: Army Melia Primary Care Provider: [name] Other Clinician: Referring Provider: [name] Treating Provider/Extender: Melburn Hake, Hao Dion Weeks in Treatment: 29 Constitutional Well-nourished and well-hydrated in no acute distress. Respiratory normal breathing without difficulty. Psychiatric this patient is able to make decisions and demonstrates good insight  into disease process. Alert and Oriented x 3. pleasant and cooperative. Notes Upon inspection patient's wound actually showed signs of good granulation at this time around the edges of the wound and even on the surface of the wound. Fortunately there is no signs of  significant systemic infection although local infection he still is experiencing drainage. I do believe the gentamicin has been of some benefit for him here. He also starts his infusion therapy this week. That is for the pyoderma. Electronic Signature(s) Signed: 09/27/2019 5:45:09 PM By: Worthy Keeler PA-C Entered By: Worthy Keeler on 09/27/2019 17:45:09 KEELAN, TRIPODI ([id number]) -------------------------------------------------------------------------------- Physician Orders Details Patient Name: [name] Date of Service: 09/27/2019 8:15 AM Medical Record Number: [id number] Patient Account Number: [id number] Date of Birth/Sex: [birth year] (39 y.o. 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 Anesthetic (add to Medication List) Wound #1 Left,Lateral Lower Leg o Topical Lidocaine 4% cream applied to wound bed prior to debridement (In Clinic Only). Primary Wound Dressing o Gentamicin Sulfate Cream Secondary Dressing Wound #1 Left,Lateral Lower Leg o Boardered Foam Dressing Dressing Change Frequency Wound #1 Left,Lateral Lower Leg o Change dressing every day. Follow-up Appointments Wound #1 Left,Lateral Lower Leg o Return Appointment in 1 week. Edema Control Wound #1 Left,Lateral Lower Leg o Elevate legs to the level of the heart and pump ankles as often as possible Electronic Signature(s) Signed: 09/27/2019 9:21:34 AM By: Army Melia Signed: 09/28/2019 11:00:46 AM By: Worthy Keeler PA-C Entered By: Army Melia on 09/27/2019 08:48:03 [name] ([id number]) -------------------------------------------------------------------------------- Problem List Details Patient Name: [name] Date of Service: 09/27/2019 8:15 AM Medical Record Number: [id number] Patient Account Number: [id number] Date of Birth/Sex: 05/25/1980 (39 y.o. M) Treating RN: Army Melia Primary Care Provider: [name] Other Clinician: Referring Provider: [name] Treating Provider/Extender: Melburn Hake, Malaney Mcbean Weeks in Treatment: 146 Active Problems ICD-10 Encounter Code Description Active Date MDM Diagnosis L97.222 Non-pressure chronic ulcer of left calf with fat layer exposed 12/04/2016 No Yes L88 Pyoderma gangrenosum 03/26/2017 No Yes I87.2 Venous insufficiency (chronic) (peripheral) 12/04/2016 No Yes L03.116 Cellulitis of left lower limb 05/24/2019 No Yes Inactive Problems ICD-10 Code Description Active Date Inactive Date L97.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 M) Treating RN: Army Melia Primary Care Provider: [name] Other Clinician: Referring Provider: [name] Treating Provider/Extender: Melburn Hake, Zafar Debrosse Weeks in Treatment: 146 Subjective Chief Complaint Information obtained from Patient He is here in follow up evaluation for LLE pyoderma ulcer History of Present Illness (HPI) 12/04/16; 39 year old man who comes into the clinic today for review of a wound  on the posterior left calf. He tells me that is been there for about a year. He is not a diabetic he does smoke half a pack per day. He was seen in the ER  on 11/20/16 felt to have cellulitis around the wound and was given clindamycin. An x-ray did not show osteomyelitis. The patient initially tells me that he has a milk allergy that sets off a pruritic itching rash on his lower legs which she scratches incessantly and he thinks that's what may have set up the wound. He has been using various topical antibiotics and ointments without any effect. He works in a [employer] and is on his feet all day. He does not have a prior history of wounds however he does have the rash on both lower legs the right arm and the ventral aspect of his left arm. These are excoriations and clearly have had scratching however there are of macular looking areas on both legs including a substantial larger area on the right leg. This does not have an underlying open area. There is no blistering. The patient tells me that 2 years ago in [place] in response to the rash on his legs he saw a dermatologist who told him he had a condition which may be pyoderma gangrenosum although I may be putting words into his mouth. He seemed to recognize this. 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 He is not in any treatment for this. He's had no recent travel 12/11/16; the patient arrives today with his wound and roughly the same condition we've been using silver alginate this is a deep punched out wound with some surrounding erythema but no tenderness. Biopsy I did did not show confirmed pyoderma gangrenosum suggested nonspecific inflammation and vasculitis but does not provide an actual description of what was seen by the pathologist. I'm really not able to understand this We have also received information from the patient's dermatologist in [place] notes from April 2016. This was a doctor Agarwal-antal.  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 He was prescribed topical steroid high potency under occlusion which helped but at this point the patient did not have a deep punched out wound. 12/18/16; the patient's wound is larger in terms of surface area however this surface looks better and there is less depth. The surrounding erythema also is better. The patient states that the wrap we put on came off 2 days ago when he has been using his compression stockings. He we are in the process of getting a dermatology consult. 12/26/16 on evaluation today patient's left lower extremity wound shows evidence of infection with surrounding erythema noted. He has been tolerating the dressing changes but states that he has noted more discomfort. There is a larger area of erythema surrounding the wound. No fevers, chills, nausea, or vomiting noted at this time. With that being said the wound still does have slough covering the surface. He is not allergic to any medication that he is aware of at this point. In regard to his right lower extremity he had several regions that are erythematous and pruritic he wonders if there's anything we can do to help that. 01/02/17 I reviewed patient's wound culture which was obtained his visit last week. He was placed on doxycycline at that point. Unfortunately that does not appear to be an antibiotic that would likely help with the situation however the pseudomonas noted on culture is sensitive to Cipro. Also unfortunately patient's wound seems to have a large compared to last week's evaluation. Not severely so but there are definitely increased measurements in general. He is continuing to have discomfort as well he writes this to be a  seven out of 10. In fact he would prefer me not to perform any debridement today due to the fact that he is having discomfort and considering he has an active infection on the little reluctant to do so anyway. No fevers, chills, nausea,  or vomiting noted at this time. 01/08/17; patient seems dermatology on September 5. I suspect dermatology will want the slides from the biopsy I did sent to their pathologist. I'm not sure if there is a way we can expedite that. In any case the culture I did before I left on vacation 3 weeks ago showed Pseudomonas he was given 10 days of Cipro and per her description of her intake nurses is actually somewhat better this week although the wound is quite a bit bigger than I remember the last time I saw this. He still has 3 more days of Cipro 01/21/17; dermatology appointment tomorrow. He has completed the ciprofloxacin for Pseudomonas. Surface of the wound looks better however he is had some deterioration in the lesions on his right leg. Meantime the left lateral leg wound we will continue with sample 01/29/17; patient had his dermatology appointment but I can't yet see that note. He is completed his antibiotics. The wound is more superficial but considerably larger in circumferential area than when he came in. This is in his left lateral calf. He also has swollen erythematous areas with superficial wounds on the right leg and small papular areas on both arms. There apparently areas in her his upper thighs and buttocks I did not look at those. Dermatology biopsied the right leg. Hopefully will have their input next week. 02/05/17; patient went back to see his dermatologist who told him that he had a "scratching problem" as well as staph. He is now on a 30 day course of doxycycline and I believe she gave him triamcinolone cream to the right leg areas to help with the itching [not exactly sure but probably triamcinolone]. She apparently looked at the left lateral leg wound although this was not rebiopsied and I think felt to be ultimately part of the same pathogenesis. He is using sample border foam and changing nevus himself. He now has a new open area on the right posterior leg which was his biopsy site I  don't have any of the dermatology notes 02/12/17; we put the patient in compression last week with SANTYL to the wound on the left leg and the biopsy. Edema is much better and the depth of the wound is now at level of skin. Area is still the same Biopsy site on the right lateral leg we've also been using santyl with a border foam dressing and he is changing this himself. 02/19/17; Using silver alginate started last week to both the substantial left leg wound and the biopsy site on the right wound. He is tolerating compression well. Has a an appointment with his primary M.D. tomorrow wondering about diuretics although I'm wondering if the edema problem is actually lymphedema ASHUR, GLATFELTER ([id number]) 02/26/17; the patient has been to see his primary doctor Dr. Jerrel Ivory at [place] our primary care. She started him on Lasix 20 mg and this seems to have helped with the edema. However we are not making substantial change with the left lateral calf wound and inflammation. The biopsy site on the right leg also looks stable but not really all that different. 03/12/17; the patient has been to see vein and vascular Dr. Lucky Cowboy. He has had venous reflux studies I have not reviewed  these. I did get a call from his dermatology office. 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 They therefore recommended clobetasol and tetracycline i.e. 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 03/26/17; apparently the patient just had reflux studies not an appointment with Dr. dew. She arrives in clinic today having applied clobetasol for 2-3 weeks. He notes over the last 2-3 days excessive drainage having to change the dressing 3-4 times a day and also  expanding erythema. 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 Lillia Carmel at [facility] dermatology locally who kindly saw him at my request. 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 My concern was the degree of erythema medially extending from his left leg wound which was either inflammation from pyoderma or cellulitis. I put him on Augmentin however culture of the wound showed Pseudomonas which is quinolone sensitive. I really don't believe he has cellulitis however in view of everything I will continue and give him a course of Cipro. He is also on doxycycline as an immune modulator for the pyoderma. In addition to his original wound on the left lateral leg with surrounding erythema he has a wound on the right posterior calf which was an original biopsy site done by dermatology. 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 Saw Dr. Lillia Carmel yesterday. He has been using topical antibiotics to both wound areas his original wound on the left and the biopsies/pathergy area on the right. There is definitely some improvement in the inflammation around the wound on the right although the patient states he has increasing sensitivity of the wounds. He is on prednisone 60 and doxycycline 1 as prescribed by Dr. Lillia Carmel. He is covering the topical antibiotic with gauze and putting this in his own compression stocks and changing this daily. He states that Dr. Lottie Rater did a culture of the left leg wound yesterday 05/07/17; pyoderma gangrenosum. The patient saw Dr. Lillia Carmel yesterday and has a follow-up with her in one month. He is still using  topical antibiotics to both wounds although he can't recall exactly what type. He is still on prednisone 60 mg. Dr. Lillia Carmel stated that the doxycycline could stop if we were in agreement. 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 The right medial leg was induced by biopsy/pathergy. The area on the right is essentially healed. 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 The right medial leg has closed and remains closed. He is still on prednisone 60. He tells me he missed his last dermatology appointment with Dr. Lillia Carmel but will make another appointment. He reports that her blood sugar at a recent screen in [place] was high 200's. He was 180 today. 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 I think he  is going to require still much longer prednisone perhaps another 3 months before attempting to taper. In the meantime his wound is a lot better. Smaller. He is cleaning this off daily and applying topical antibiotics. When he was last in the clinic I thought about changing to [facility] and actually put in a couple of calls to dermatology although probably not during their business hours. 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 He continues on oral prednisone. He has been using triple antibiotic ointment. There is surface debris and we will transition to [facility] and have him return in 2 weeks. He has lost 30 pounds since his last appointment with lifestyle modification. He may benefit from topical steroid cream for treatment this can be considered at a later date. 08/22/17 on evaluation today patient appears to actually be doing rather well in regard to his left lateral  lower extremity ulcer. He has actually been managed by Dr. Dellia Nims most recently. Patient is currently on oral steroids at this time. This seems to have been of benefit for him. Nonetheless his last visit was actually with Leah on 08/06/17. Currently he is not utilizing any topical steroid creams although this could be of benefit as well. No fevers, chills, nausea, or vomiting noted at this time. 09/05/17 on evaluation today patient appears to be doing better in regard to his left lateral lower extremity ulcer. He has been tolerating the dressing changes without complication. He is using Santyl with good effect. Overall I'm very pleased with how things are standing at this point. Patient likewise is happy that this is doing better. 09/19/17 on evaluation today patient actually appears to be doing rather well in regard to his left lateral lower extremity ulcer. 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 He's not having any significant pain. 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 He has been tolerating the Santyl without complication and in general I feel like he's making good progress. 10/17/17 on evaluation today patient appears to be doing very well in regard to his left lateral lower surety ulcer. He has been tolerating the dressing changes without complication. There does not appear to be any evidence of infection he's alternating the Santyl and the triple antibiotic ointment every other day this seems to be doing well for him. 11/03/17 on evaluation today patient appears to be doing very well in regard to his left lateral lower extremity ulcer. He is been tolerating the dressing changes without complication which is good news. Fortunately there does not appear to be any evidence of infection which is also  great news. Overall is doing excellent they are starting to taper down on the prednisone is down to 40 mg at this point it also started topical clobetasol for him. 11/17/17 on evaluation today patient appears to be doing well in regard to his left lateral lower surety ulcer. He's been tolerating the dressing changes without complication. He does note that he is having no pain, no excessive drainage or discharge, and overall he feels like things are going about how he would expect and hope they would. Overall he seems to have no evidence of infection at this time in my opinion  which is good news. 12/04/17-He is seen in follow-up evaluation for right lateral lower extremity ulcer. He has been applying topical steroid cream. Today's measurement show slight increase in size. Over the next 2 weeks we will transition to every other day Santyl and steroid cream. He has been encouraged to monitor for changes and notify clinic with any concerns 12/15/17 on evaluation today patient's left lateral motion the ulcer and fortunately is doing worse again at this point. This just since last week to this week has close to doubled in size according to the patient. I did not seeing last week's I do not have a visual to compare this to in our system was also down so we do not have all the charts and at this point. Nonetheless it does have me somewhat concerned in regard to the fact that again he was worried enough about it he has contact the dermatology that placed them back on the full strength, 50 mg a day of the prednisone that he was taken previous. He continues to alternate using clobetasol along with Santyl at this point. He is obviously somewhat frustrated. ELIZARDO, CHILSON ([id number]) 12/22/17 on evaluation today patient appears to be doing a little worse compared to last evaluation. Unfortunately the wound is a little deeper and slightly larger than the last week's evaluation. With that being said he has made some  progress in regard to the irritation surrounding at this time unfortunately despite that progress that's been made he still has a significant issue going on here. 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 For that reason I discussed with him today the possibility of perform a wound culture to ensure there's nothing overtly infected. 01/06/18 on evaluation today patient's wound is larger and deeper than previously evaluated. With that being said it did appear that his wound was infected after my last evaluation with him. Subsequently I did end up prescribing a prescription for Bactrim DS which she has been taking and having no complication with. Fortunately there does not appear to be any evidence of infection at this point in time as far as anything spreading, no want to touch, and overall I feel like things are showing signs of improvement. 01/13/18 on evaluation today patient appears to be even a little larger and deeper than last time. There still muscle exposed in the base of the wound. Nonetheless he does appear to be less erythematous I do believe inflammation is calming down also believe the infection looks like it's probably resolved at this time based on what I'm seeing. No fevers, chills, nausea, or vomiting noted at this time. 01/30/18 on evaluation today patient actually appears to visually look better for the most part. Unfortunately those visually this looks better he does seem to potentially have what may be an abscess in the muscle that has been noted in the central portion of the wound. This is the first time that I have noted what appears to be fluctuance in the central portion of the muscle. With that being said I'm somewhat more concerned about the fact that this might indicate an abscess formation at this location. I do believe that  an ultrasound would be appropriate. This is likely something we need to try to do as soon as possible. He has been switch to mupirocin ointment and he is no longer using the steroid ointment as prescribed  by dermatology he sees them again next week he's been decreased from 60 to 40 mg of prednisone. 03/09/18 on evaluation today patient actually appears to be doing a little better compared to last time I saw him. There's not as much erythema surrounding the wound itself. He I did review his most recent infectious disease note which was dated 02/24/18. He saw Dr. Michel Bickers in [place]. 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 Patient is now off of antibiotics and they are continually observing this. There seems to be no change in the past two weeks in my pinion based on what the patient says and what I see today compared to what Dr. Megan Salon likely saw two weeks ago. No fevers, chills, nausea, or vomiting noted at this time. 03/23/18 on evaluation today patient's wound actually appears to be showing signs of improvement which is good news. He is currently still on the Dapsone. He is also working on tapering the prednisone to get off of this and Dr. Lottie Rater is working with him in this regard. Nonetheless overall I feel like the wound is doing well it does appear based on the infectious disease note that I reviewed from Dr. 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 I did also review the results of his ultrasound of left lower extremity which revealed there was a dentist tissue in the base of the wound without an abscess noted. 04/06/18 on evaluation today the patient's left lateral lower extremity ulcer actually appears to be doing fairly well which is excellent news. There does not appear to be  any evidence of infection at this time which is also great news. Overall he still does have a significantly large ulceration although little by little he seems to be making progress. He is down to 10 mg a day of the prednisone. 04/20/18 on evaluation today patient actually appears to be doing excellent at this time in regard to his left lower extremity ulcer. He's making signs of good progress unfortunately this is taking much longer than we would really like to see but nonetheless he is making progress. Fortunately there does not appear to be any evidence of infection at this time. No fevers, chills, nausea, or vomiting noted at this time. The patient has not been using the Santyl due to the cost he hadn't got in this field yet. He's mainly been using the antibiotic ointment topically. Subsequently he also tells me that he really has not been scrubbing in the shower I think this would be helpful again as I told him it doesn't have to be anything too aggressive to even make it believe just enough to keep it free of some of the loose slough and biofilm on the wound surface. 05/11/18 on evaluation today patient's wound appears to be making slow but sure progress in regard to the left lateral lower extremity ulcer. He is been tolerating the dressing changes without complication. Fortunately there does not appear to be any evidence of infection at this time. He is still just using triple antibiotic ointment along with clobetasol occasionally over the area. He never got the Santyl and really does not seem to intend to in my pinion. 06/01/18 on evaluation today patient appears to be doing a little better in regard to his left  lateral lower extremity ulcer. He states that overall he does not feel like he is doing as well with the Dapsone as he did with the prednisone. Nonetheless he sees his dermatologist later today and is gonna talk to them about the possibility of going back on the prednisone. Overall again  I believe that the wound would be better if you would utilize Santyl but he really does not seem to be interested in going back to the [place] at this point. He has been using triple antibiotic ointment. 06/15/18 on evaluation today patient's wound actually appears to be doing about the same at this point. Fortunately there is no signs of infection at this time. He has made slight improvements although he continues to not really want to clean the wound bed at this point. He states that he just doesn't mess with it he doesn't want to cause any problems with everything else he has going on. He has been on medication, antibiotics as prescribed by his dermatologist, for a staff infection of his lower extremities which is really drying out now and looking much better he tells me. Fortunately there is no sign of overall infection. 06/29/18 on evaluation today patient appears to be doing well in regard to his left lateral lower surety ulcer all things considering. Fortunately his staff infection seems to be greatly improved compared to previous. He has no signs of infection and this is drying up quite nicely. He is still the doxycycline for this is no longer on cental, Dapsone, or any of the other medications. His dermatologist has recommended possibility of an infusion but right now he does not want to proceed with that. 07/13/18 on evaluation today patient appears to be doing about the same in regard to his left lateral lower surety ulcer. Fortunately there's no signs of infection at this time which is great news. Unfortunately he still builds up a significant amount of Slough/biofilm of the surface of the wound he still is not really cleaning this as he should be appropriately. Again I'm able to easily with saline and gauze remove the majority of this on the surface which if you would do this at home would likely be a dramatic improvement for him as far as getting the area to improve. Nonetheless overall I  still feel like he is making progress is just very slow. I think Santyl will be of benefit for him as well. Still he has not gotten this as of this point. 07/27/18 on evaluation today patient actually appears to be doing little worse in regards of the erythema around the periwound region of the wound he also tells me that he's been having more drainage currently compared to what he was experiencing last time I saw him. He states not quite as bad as what he had because this was infected previously but nonetheless is still appears to be doing poorly. Fortunately there is no evidence of systemic infection at this point. The patient tells me that he is not going to be able to afford the Santyl. He is still waiting to hear about the infusion therapy with his dermatologist. Apparently she wants an updated colonoscopy first. KOLDEN, DUPEE ([id number]) 08/10/18 on evaluation today patient appears to be doing better in regard to his left lateral lower extremity ulcer. Fortunately he is showing signs of improvement in this regard he's actually been approved for Remicade infusion's as well although this has not been scheduled as of yet. Fortunately there's no signs of active infection at this  time in regard to the wound although he is having some issues with infection of the right lower extremity is been seen as dermatologist for this. Fortunately they are definitely still working with him trying to keep things under control. 09/07/18 on evaluation today patient is actually doing rather well in regard to his left lateral lower extremity ulcer. He notes these actually having some hair grow back on his extremity which is something he has not seen in years. He also tells me that the pain is really not giving them any trouble at this time which is also good news overall she is very pleased with the progress he's using a combination of the mupirocin along with the probate is all mixed. 09/21/18 on evaluation today patient  actually appears to be doing fairly well all things considered in regard to his looks from the ulcer. He's been tolerating the dressing changes without complication. Fortunately there's no signs of active infection at this time which is good news he is still on all antibiotics or prevention of the staff infection. He has been on prednisone for time although he states it is gonna contact his dermatologist and see if she put them on a short course due to some irritation that he has going on currently. Fortunately there's no evidence of any overall worsening this is going very slow I think cental would be something that would be helpful for him although he states that $50 for tube is quite expensive. He therefore is not willing to get that at this point. 10/06/18 on evaluation today patient actually appears to be doing decently well in regard to his left lateral leg ulcer. He's been tolerating the dressing changes without complication. Fortunately there's no signs of active infection at this time. Overall I'm actually rather pleased with the progress he's making although it's slow he doesn't show any signs of infection and he does seem to be making some improvement. I do believe that he may need a switch up and dressings to try to help this to heal more appropriately and quickly. 10/19/18 on evaluation today patient actually appears to be doing better in regard to his left lateral lower extremity ulcer. This is shown signs of having much less Slough buildup at this point due to the fact he has been using the [employer]. Obviously this is very good news. The overall size of the wound is not dramatically smaller but again the appearance is. 11/02/18 on evaluation today patient actually appears to be doing quite well in regard to his lower [place] ulcer. A lot of the skin around the ulcer is actually somewhat irritating at this point this seems to be more due to the dressing causing irritation from the adhesive that  anything else. Fortunately there is no signs of active infection at this time. 11/24/18 on evaluation today patient appears to be doing a little worse in regard to his overall appearance of his lower extremity ulcer. There's more erythema and warmth around the wound unfortunately. He is currently on doxycycline which he has been on for some time. With that being said I'm not sure that seems to be helping with what appears to possibly be an acute cellulitis with regard to his left lower extremity ulcer. No fevers, chills, nausea, or vomiting noted at this time. 12/08/18 on evaluation today patient's wounds actually appears to be doing significantly better compared to his last evaluation. He has been using Santyl along with alternating tripling about appointment as well as the steroid cream  seems to be doing quite well and the wound is showing signs of improvement which is excellent news. Fortunately there's no evidence of infection and in fact his culture came back negative with only normal skin flora noted. 12/21/2018 upon evaluation today patient actually appears to be doing excellent with regard to his ulcer. This is actually the best that I have seen it since have been helping to take care of him. It is both smaller as well as less slough noted on the surface of the wound and seems to be showing signs of good improvement with new skin growing from the edges. He has been using just the triamcinolone he does wonder if he can get a refill of that ointment today. 01/04/2019 upon evaluation today patient actually appears to be doing well with regard to his left lateral lower extremity ulcer. With that being said it does not appear to be that he is doing quite as well as last time as far as progression is concerned. There does not appear to be any signs of infection or significant irritation which is good news. With that being said I do believe that he may benefit from switching to a collagen based dressing  based on how clean The wound appears. 01/18/2019 on evaluation today patient actually appears to be doing well with regard to his wound on the left lower extremity. He is not made a lot of progress compared to where we were previous but nonetheless does seem to be doing okay at this time which is good news. There is no signs of active infection which is also good news. My only concern currently is I do wish we can get him into utilizing the collagen dressing his insurance would not pay for the supplies that we ordered although it appears that he may be able to order this through his supply company that he typically utilizes. This is [employer]. Nonetheless he did try to order it during the office visit today and it appears this did go through. We will see if he can get that it is a different brand but nonetheless he has collagen and I do think will be beneficial. 02/01/2019 on evaluation today patient actually appears to be doing a little worse today in regard to the overall size of his wounds. Fortunately there is no signs of active infection at this time. That is visually. Nonetheless when this is happened before it was due to infection. For that reason were somewhat concerned about that this time as well. 02/08/2019 on evaluation today patient unfortunately appears to be doing slightly worse with regard to his wound upon evaluation today. Is measuring a little deeper and a little larger unfortunately. I am not really sure exactly what is causing this to enlarge he actually did see his dermatologist she is going to see about initiating Humira for him. Subsequently she also did do steroid injections into the wound itself in the periphery. Nonetheless still nonetheless he seems to be getting a little bit larger he is gone back to just using the steroid cream topically which I think is appropriate. I would say hold off on the collagen for the time being is definitely a good thing to do. Based on the culture  results which we finally did get the final result back regarding it shows staph as the bacteria noted again that can be a normal skin bacteria based on the fact however he is having increased drainage and worsening of the wound measurement wise I would go  ahead and place him on an antibiotic today I do believe for this. 02/15/2019 on evaluation today patient actually appears to be doing somewhat better in regard to his ulcer. There is no signs of worsening at this time I did review his culture results which showed evidence of Staphylococcus aureus but not MRSA. Again this could just be more related to the normal skin bacteria although he states the drainage has slowed down quite a bit he may have had a mild infection not just colonization. And was much smaller and then since around10/04/2019 on evaluation today patient appears to be doing unfortunately worse as far as the size of the wound. I really feel like that this is steadily getting larger again it had been doing excellent right at the beginning of September we have seen a steady increase in the area of the wound it is almost 2-1/2 times the size it was on September 1. Obviously this is a bad trend this is not wanting to see. For that reason we went back to using just the topical triamcinolone cream which does seem to help with inflammation. I checked him for bacteria by way of culture and nothing showed positive there. I am considering giving him a short course of a tapering steroid Ameer Sanden, [name] ([id number]) today to see if that is can be beneficial for him. The patient is in agreement with giving that a try. 03/08/2019 on evaluation today patient appears to be doing very well in comparison to last evaluation with regard to his lower extremity ulcer. This is showing signs of less inflammation and actually measuring slightly smaller compared to last time every other week over the past month and a half he has been measuring larger larger  larger. Nonetheless I do believe that the issue has been inflammation the prednisone does seem to have been beneficial for him which is good news. No fevers, chills, nausea, vomiting, or diarrhea. 03/22/2019 on evaluation today patient appears to be doing about the same with regard to his leg ulcer. He has been tolerating the dressing changes without complication. With that being said the wound seems to be mostly arrested at its current size but really is not making any progress except for when we prescribed the prednisone. He did show some signs of dropping as far as the overall size of the wound during that interval week. Nonetheless this is something he is not on long-term at this point and unfortunately I think he is getting need either this or else the Humira which his dermatologist has discussed try to get approval for. With that being said he will be seeing his dermatologist on the 11th of this month that is November. 04/19/2019 on evaluation today patient appears to be doing really about the same the wound is measuring slightly larger compared to last time I saw him. He has not been into the office since November 2 due to the fact that he unfortunately had Covid as that his entire family. He tells me that it was rough but they did pull-through and he seems to be doing much better. Fortunately there is no signs of active infection at this time. No fevers, chills, nausea, vomiting, or diarrhea. 05/10/2019 on evaluation today patient unfortunately appears to be doing significantly worse as compared to last time I saw him. He does tell me that he has had his first dose of Humira and actually is scheduled to get the next one in the upcoming week. With that being said he tells  me also that in the past several days he has been having a lot of issues with green drainage she showed me a picture this is more blue-green in color. He is also been having issues with increased sloughy buildup and the wound  does appear to be larger today. Obviously this is not the direction that we want everything to take based on the starting of his Humira. Nonetheless I think this is definitely a result of likely infection and to be honest I think this is probably Pseudomonas causing the infection based on what I am seeing. 05/24/2019 on evaluation today patient unfortunately appears to be doing significantly worse compared to his prior evaluation with me 2 weeks ago. I did review his culture results which showed that he does have Staph aureus as well as Pseudomonas noted on the culture. Nonetheless the Levaquin that I prescribed for him does not appear to have been appropriate and in fact he tells me he is no longer experiencing the green drainage and discharge that he had at the last visit. Fortunately there is no signs of active infection at this time which is good news although the wound has significantly worsened it in fact is much deeper than it was previous. We have been utilizing up to this point triamcinolone ointment as the prescription topical of choice but at this time I really feel like that the wound is getting need to be packed in order to appropriately manage this due to the deeper nature of the wound. Therefore something along the lines of an alginate dressing may be more appropriate. 05/31/2019 upon inspection today patient's wound actually showed signs of doing poorly at this point. Unfortunately he just does not seem to be making any good progress despite what we have tried. He actually did go ahead and pick up the Cipro and start taking that as he was noticing more green drainage he had previously completed the Levaquin that I prescribed for him as well. Nonetheless he missed his appointment for the seventh last week on Wednesday with the wound care center and [facility] where his dermatologist referred him. Obviously I do think a second opinion would be helpful at this point especially in light  of the fact that the patient seems to be doing so poorly despite the fact that we have tried everything that I really know how at this point. The only thing that ever seems to have helped him in the past is when he was on high doses of continual steroids that did seem to make a difference for him. 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 06/07/2019 upon evaluation today patient unfortunately appears to be doing worse yet again with regard to his wound. In fact I am starting to question whether or not he may have a fluid pocket in the muscle at this point based on the bulging and the soft appearance to the central portion of the muscle area. There is not anything draining from the muscle itself at this time which is good news but nonetheless the wound is expanding. I am not really seeing any results of the Humira as far as overall wound progression based on what I am seeing at this point. The patient has been referred for second opinion with regard to his wound to the [facility]  wound care center by his dermatologist which I definitely am not in opposition to. Unfortunately we tried multiple dressings in the past including collagen, alginate, and at one point even Hydrofera Blue. With that being said he is never really used it for any significant amount of time due to the fact that he often complains of pain associated with these dressings and then will go back to either using the Santyl which she has done intermittently or more frequently the triamcinolone. He is also using his own compression stockings. We have wrapped him in the past but again that was something else that he really was not a big fan of. 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 He seemed to do much better when he was on the steroids although honestly I understand that the steroids are not necessarily the best medication to be on long-term obviously 06/14/2019 on evaluation today patient appears to be doing actually a little bit better with regard to the overall appearance with his leg. Unfortunately he does continue to have issues with what appears to be some fluid underneath the muscle although he did see the wound specialty center at [facility] last week their main goals were to see about infusion therapy in place of the Humira as they feel like that is not quite strong enough. They also recommended that we continue with the treatment otherwise as we are they felt like that was appropriate and they are okay with him continuing to follow-up here with [place] in that regard. With that being said they are also sending him to the vein specialist there to see about vein stripping and if that would be of benefit for him. Subsequently they also did not really address whether or not an ultrasound of the muscle area to see if there is anything that needs to be addressed here would be appropriate or not. For that reason I discussed this with him last week I think we may proceed down that road at this point. 06/21/2019 upon evaluation today patient's wound actually appears to be doing slightly better compared to previous evaluations. I do believe that he has made a difference with regard to the progression here with the use of oral steroids. Again in the past has been the only thing that is really calm things down. He does tell me that from [facility] is gotten a good news from there that there are no further vein stripping that is necessary at this point. I do not have that available for  review today although the patient did relay this to me. He also did obtain and have the ultrasound of the wound completed which I did sign off on today. It does appear that there is no fluid collection under the muscle this is likely then just edematous tissue in general. That is also good news. Overall I still believe the inflammation is the main issue here. He did inquire about the possibility of a wound VAC again with the muscle protruding like it is I am not really sure whether the wound VAC is necessarily ideal or not. That is something we will have to consider although I do  believe he may need compression wrapping to try to help with edema control which could potentially be of benefit. 06/28/2019 on evaluation today patient appears to be doing slightly better measurement wise although this is not terribly smaller he least seems to be trending towards that direction. With that being said he still seems to have purulent drainage noted in the wound bed at this time. He has been on Levaquin followed by Cipro over the past month. Unfortunately he still seems to have some issues with active infection at this time. I did perform a culture last week in order to evaluate and see if indeed there was still anything going on. Subsequently the culture did come back Mettler, Tonio ([id number]) showing Pseudomonas which is consistent with the drainage has been having which is blue-green in color. He also has had an odor that again was somewhat consistent with Pseudomonas as well. Long story short it appears that the culture showed an intermediate finding with regard to how well the Cipro will work for the Pseudomonas infection. Subsequently being that he does not seem to be clearing up and at best what we are doing is just keeping this at [place] I think he may need to see infectious disease to discuss IV antibiotic options. 07/05/2019 upon evaluation today patient appears to be doing okay in regard to his leg ulcer.  He has been tolerating the dressing changes at this point without complication. Fortunately there is no signs of active infection at this time which is good news. No fevers, chills, nausea, vomiting, or diarrhea. With that being said he does have an appointment with infectious disease tomorrow and his primary care on Wednesday. Again the reason for the infectious disease referral was due to the fact that he did not seem to be fully resolving with the use of oral antibiotics and therefore we were thinking that IV antibiotic therapy may be necessary secondary to the fact that there was an intermediate finding for how effective the Cipro may be. Nonetheless again he has been having a lot of purulent and even green drainage. Fortunately right now that seems to have calmed down over the past week with the reinitiation of the oral antibiotic. Nonetheless we will see what Dr. Megan Salon has to say. 07/12/2019 upon evaluation today patient appears to be doing about the same at this point in regard to his left lower extremity ulcer. Fortunately there is no signs of active infection at this time which is good news I do believe the Levaquin has been beneficial I did review Dr. Hale Bogus note and to be honest I agree that the patient's leg does appear to be doing better currently. What we found in the past as he does not seem to really completely resolve he will stop the antibiotic and then subsequently things will revert back to having issues with blue-green drainage, increased pain, and overall worsening in general. Obviously that is the reason I sent him back to infectious disease. 07/19/2019 upon evaluation today patient appears to be doing roughly the same in size there is really no dramatic improvement. He has started back on the Levaquin at this point and though he seems to be doing okay he did still have a lot of blue/green drainage noted on evaluation today unfortunately. I think that this is still indicative  more likely of a Pseudomonas infection as previously noted and again he does see Dr. Megan Salon in just a couple of days. I do not know that were really able to effectively  clear this with just oral antibiotics alone based on what I am seeing currently. Nonetheless we are still continue to try to manage as best we can with regard to the patient and his wound. I do think the wrap was helpful in decreasing the edema which is excellent news. No fevers, chills, nausea, vomiting, or diarrhea. 07/26/2019 upon evaluation today patient appears to be doing slightly better with regard to the overall appearance of the muscle there is no dark discoloration centrally. Fortunately there is no signs of active infection at this time. No fevers, chills, nausea, vomiting, or diarrhea. Patient's wound bed currently the patient did have an appointment with Dr. Megan Salon at infectious disease last week. With that being said Dr. Megan Salon the patient states was still somewhat hesitant about put him on any IV antibiotics he wanted [place] to repeat cultures today and then see where things go going forward. He does look like Dr. Megan Salon because of some improvement the patient did have with the Levaquin wanted [place] to see about repeating cultures. If it indeed grows the Pseudomonas again then he recommended a possibility of considering a PICC line placement and IV antibiotic therapy. He plans to see the patient back in 1 to 2 weeks. 08/02/2019 upon evaluation today patient appears to be doing poorly with regard to his left lower extremity. We did get the results of his culture back it shows that he is still showing evidence of Pseudomonas which is consistent with the purulent/blue-green drainage that he has currently. Subsequently the culture also shows that he now is showing resistance to the oral fluoroquinolones which is unfortunate as that was really the only thing to treat the infection prior. I do believe that he is looking like this  is going require IV antibiotic therapy to get this under control. Fortunately there is no signs of systemic infection at this time which is good news. The patient does see Dr. Megan Salon tomorrow. 08/09/2019 upon evaluation today patient appears to be doing better with regard to his left lower extremity ulcer in regard to the overall appearance. He is currently on IV antibiotic therapy. As ordered by Dr. Megan Salon. Currently the patient is on ceftazidime which she is going to take for the next 2 weeks and then follow-up for 4 to 5-week appointment with Dr. Megan Salon. The patient started this this past Friday symptoms have not for a total of 3 days currently in full. 08/16/2019 upon evaluation today patient's wound actually does show muscle in the base of the wound but in general does appear to be much better as far as the overall evidence of infection is concerned. In fact I feel like this is for the most part cleared up he still on the IV antibiotics he has not completed the full course yet but I think he is doing much better which is excellent news. 08/23/2019 upon evaluation today patient appears to be doing about the same with regard to his wound at this point. He tells me that he still has pain unfortunately. Fortunately there is no evidence of systemic infection at this time which is great news. There is significant muscle protrusion. 09/13/19 upon evaluation today patient appears to be doing about the same in regard to his leg unfortunately. He still has a lot of drainage coming from the ulceration there is still muscle exposed. With that being said the patient's last wound culture still showed an intermediate finding with regard to the Pseudomonas he still having the bluish/green drainage as well. Overall  I do not know that the wound has completely cleared of infection at this point. Fortunately there is no signs of active infection systemically at this point which is good news. 09/20/2019 upon  evaluation today patient's wound actually appears to be doing about the same based on what I am seeing currently. I do not see any signs of systemic infection he still does have evidence of some local infection and drainage. He did see Dr. Megan Salon last week and Dr. Megan Salon states that he probably does need a different IV antibiotic although he does not want to put him on this until the patient begins the Remicade infusion which is actually scheduled for about 10 days out from today on 13 May. Following that time Dr. Megan Salon is good to see him back and then will evaluate the feasibility of starting him on the IV antibiotic therapy once again at that point. I do not disagree with this plan I do believe as Dr. Megan Salon stated in his note that I reviewed today that the patient's issue is multifactorial with the pyoderma being 1 aspect of this that were hoping the Remicade will be helpful for her. In the meantime I think the gentamicin is, helping to keep things under decent okay control in regard to the ulcer. 09/27/2019 upon evaluation today patient appears to be doing about the same with regard to his wound still there is a lot of muscle exposure though he does have some hyper granulation tissue noted around the edge and actually some granulation tissue starting to form over the muscle which is actually good news. Fortunately there is no evidence of active infection which is also good news. His pain is less at this point. ION, [name] ([id number]) Objective Constitutional Well-nourished and well-hydrated in no acute distress. Vitals Time Taken: 8:23 AM, Height: 71 in, Weight: 338 lbs, BMI: 47.1, Temperature: 98.8 F, Pulse: 81 bpm, Respiratory Rate: 16 breaths/min, Blood Pressure: 124/74 mmHg. Respiratory normal breathing without difficulty. Psychiatric this patient is able to make decisions and demonstrates good insight into disease process. Alert and Oriented x 3. pleasant and  cooperative. General Notes: Upon inspection patient's wound actually showed signs of good granulation at this time around the edges of the wound and even on the surface of the wound. Fortunately there is no signs of significant systemic infection although local infection he still is experiencing drainage. I do believe the gentamicin has been of some benefit for him here. He also starts his infusion therapy this week. That is for the pyoderma. Integumentary (Hair, Skin) Wound #1 status is Open. Original cause of wound was Gradually Appeared. The wound is located on the Left,Lateral Lower Leg. The wound measures 7.2cm length x 7.6cm width x 1.2cm depth; 42.977cm^2 area and 51.572cm^3 volume. There is muscle and Fat Layer (Subcutaneous Tissue) Exposed exposed. There is no tunneling or undermining noted. There is a medium amount of purulent drainage noted. The wound margin is epibole. There is large (67-100%) pink granulation within the wound bed. There is a small (1-33%) amount of necrotic tissue within the wound bed including Adherent Slough. 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 Anesthetic (add to Medication List): Wound #1 Left,Lateral Lower Leg: Topical Lidocaine 4% cream applied to wound bed prior to debridement (In Clinic Only). Primary Wound Dressing: Gentamicin Sulfate Cream Secondary Dressing:  Wound #1 Left,Lateral Lower Leg: Boardered Foam Dressing Dressing Change Frequency: Wound #1 Left,Lateral Lower Leg: Change dressing every day. Follow-up Appointments: Wound #1 Left,Lateral Lower Leg: Return Appointment in 1 week. Edema Control: Wound #1 Left,Lateral Lower Leg: Elevate legs to the level of the heart and pump ankles as often as  possible 1. I would recommend currently that we go ahead and initiate treatment with a continuation of the gentamicin cream followed by the border foam dressing that seems to have done well for him up to this point. RUBE, SANCHEZ ([id number]) 2. I would also recommend that he continue to use his compression stocking in order to keep the edema under good control. 3. When he is at home he should also be elevating his legs is much as possible. We will see patient back for reevaluation in 1 week here in the clinic. If anything worsens or changes patient will contact our office for additional recommendations. Electronic Signature(s) Signed: 09/27/2019 5:45:47 PM By: Worthy Keeler PA-C Entered By: Worthy Keeler on 09/27/2019 17:45:46 [name], [name] [place] ([id number]) -------------------------------------------------------------------------------- SuperBill Details Patient Name: [name] Date of Service: 09/27/2019 Medical Record Number: [id number] Patient Account Number: [id number] Date of Birth/Sex: [birth year] (39 y.o. M) Treating RN: Army Melia Primary Care Provider: [name] Other Clinician: Referring Provider: [name] Treating Provider/Extender: Melburn Hake, Barbarita Hutmacher Weeks in Treatment: 146 Diagnosis Coding ICD-10 Codes Code Description [phone] Non-pressure chronic ulcer of left calf with fat layer exposed L88 Pyoderma gangrenosum I87.2 Venous insufficiency (chronic) (peripheral) L03.116 Cellulitis of left lower limb Facility Procedures CPT4 Code: [id number] Description: 85027 - WOUND CARE VISIT-LEV 3 EST PT Modifier: Quantity: 1 Physician Procedures CPT4 Code: 7412878 Description: 67672 - WC PHYS LEVEL 3 - EST PT Modifier: Quantity: 1 CPT4 Code: Description: ICD-10 Diagnosis Description L97.222 Non-pressure chronic ulcer of left calf with fat layer exposed L88 Pyoderma gangrenosum I87.2 Venous insufficiency (chronic) (peripheral) L03.116 Cellulitis of left lower  limb Modifier: Quantity: Electronic Signature(s) Signed: 09/27/2019 5:46:13 PM By: Worthy Keeler PA-C Entered By: Worthy Keeler on 09/27/2019 17:46:13

## 2019-09-29 ENCOUNTER — Telehealth: Payer: Self-pay | Admitting: *Deleted

## 2019-09-29 NOTE — Unmapped (Signed)
Called Dr. Orvan Falconer, Infectious Disease, regarding starting remicade in the setting of pseudomonas.  Dr. Orvan Falconer is in agreement with the plan and patient can start tomorrow as scheduled.

## 2019-09-29 NOTE — Telephone Encounter (Signed)
Dr Carles Collet called to ask Dr Marcos Eke opinion on restarting remicaid tomorrow 5/13.  OK per Dr Orvan Falconer, relayed to Dr Carles Collet.  Patient follows up at [facility] 5/19. Andree Coss, RN

## 2019-09-30 NOTE — Unmapped (Signed)
Called [facility] and spoke with [place].  Informed her that the antihistamine is just the ceterizine per Dr. Carles Collet.

## 2019-09-30 NOTE — Unmapped (Signed)
Call from [facility].  Received plan of treatment form from Dr. Carles Collet.  Need to clarify if patient is to receive both antihistamines checked on form(Loratadine and cetirizine) or only one of them and if only one, which one.

## 2019-10-04 ENCOUNTER — Other Ambulatory Visit: Payer: Self-pay | Admitting: Primary Care

## 2019-10-04 ENCOUNTER — Encounter: Payer: 59 | Admitting: Physician Assistant

## 2019-10-04 DIAGNOSIS — Z720 Tobacco use: Secondary | ICD-10-CM

## 2019-10-04 MED ORDER — CHANTIX STARTING MONTH PAK 0.5 MG X 11 & 1 MG X 42 PO TABS: ORAL_TABLET | ORAL | 0 refills | Status: DC

## 2019-10-05 MED ORDER — CHANTIX STARTING MONTH BOX 0.5 MG (11)-1 MG (42) TABLETS IN DOSE PACK
0 days
Start: 2019-10-05 — End: ?

## 2019-10-05 NOTE — Unmapped (Signed)
This encounter was created in error - please disregard.

## 2019-10-06 ENCOUNTER — Ambulatory Visit: Payer: 59 | Admitting: Internal Medicine

## 2019-10-08 ENCOUNTER — Other Ambulatory Visit: Payer: Self-pay

## 2019-10-08 ENCOUNTER — Encounter: Payer: 59 | Admitting: Internal Medicine

## 2019-10-08 DIAGNOSIS — L97222 Non-pressure chronic ulcer of left calf with fat layer exposed: Secondary | ICD-10-CM | POA: Diagnosis not present

## 2019-10-08 NOTE — Progress Notes (Signed)
[name], [name] ([id number]) Visit Report for 10/08/2019 Arrival Information Details Patient Name: [name], [name] Date of Service: 10/08/2019 8:00 AM Medical Record Number: [id number] Patient Account Number: [id number] Date of Birth/Sex: 1981-05-11 (39 y.o. M) Treating RN: Army Melia Primary Care Dianey Suchy: [name] Other Clinician: Referring Mercadez Heitman: [name] Treating Raesean Bartoletti/Extender: Tito Dine in Treatment: 42 Visit Information History Since Last Visit Added or deleted any medications: No Patient Arrived: Ambulatory Any new allergies or adverse reactions: No Arrival Time: 08:06 Had a fall or experienced change in No Accompanied By: self activities of daily living that may affect Transfer Assistance: None risk of falls: Patient Requires Transmission-Based No Signs or symptoms of abuse/neglect since last visito No Precautions: Hospitalized since last visit: No Patient Has Alerts: Yes Has Dressing in Place as Prescribed: Yes Patient Alerts: 08/09/19 Picc Line Pain Present Now: Yes Right Electronic Signature(s) Signed: 10/08/2019 11:08:48 AM By: Army Melia Entered By: Army Melia on 10/08/2019 08:06:44 [name] ([id number]) -------------------------------------------------------------------------------- Clinic Level of Care Assessment Details Patient Name: [name] Date of Service: 10/08/2019 8:00 AM Medical Record Number: [id number] Patient Account Number: [id number] Date of Birth/Sex: June 20, 1980 (39 y.o. M) Treating RN: Montey Hora Primary Care Jaiyden Laur: [name] Other Clinician: Referring Macsen [name]: [name] Treating Aryanne Gilleland/Extender: Tito Dine in Treatment: 148 Clinic Level of Care Assessment Items TOOL 4 Quantity Score []  - Use when only an EandM is performed on FOLLOW-UP visit 0 ASSESSMENTS - Nursing Assessment / Reassessment X - Reassessment of Co-morbidities (includes updates in patient status) 1  10 X- 1 5 Reassessment of Adherence to Treatment Plan ASSESSMENTS - Wound and Skin Assessment / Reassessment []  - Simple Wound Assessment / Reassessment - one wound 0 X- 2 5 Complex Wound Assessment / Reassessment - multiple wounds []  - 0 Dermatologic / Skin Assessment (not related to wound area) ASSESSMENTS - Focused Assessment []  - Circumferential Edema Measurements - multi extremities 0 []  - 0 Nutritional Assessment / Counseling / Intervention X- 1 5 Lower Extremity Assessment (monofilament, tuning fork, pulses) []  - 0 Peripheral Arterial Disease Assessment (using hand held doppler) ASSESSMENTS - Ostomy and/or Continence Assessment and Care []  - Incontinence Assessment and Management 0 []  - 0 Ostomy Care Assessment and Management (repouching, etc.) PROCESS - Coordination of Care X - Simple Patient / Family Education for ongoing care 1 15 []  - 0 Complex (extensive) Patient / Family Education for ongoing care X- 1 10 Staff obtains Programmer, systems, Records, Test Results / Process Orders []  - 0 Staff telephones HHA, Nursing Homes / Clarify orders / etc []  - 0 Routine Transfer to another Facility (non-emergent condition) []  - 0 Routine Hospital Admission (non-emergent condition) []  - 0 New Admissions / Biomedical engineer / Ordering NPWT, Apligraf, etc. []  - 0 Emergency Hospital Admission (emergent condition) X- 1 10 Simple Discharge Coordination []  - 0 Complex (extensive) Discharge Coordination PROCESS - Special Needs []  - Pediatric / Minor Patient Management 0 []  - 0 Isolation Patient Management []  - 0 Hearing / Language / Visual special needs []  - 0 Assessment of Community assistance (transportation, D/C planning, etc.) []  - 0 Additional assistance / Altered mentation []  - 0 Support Surface(s) Assessment (bed, cushion, seat, etc.) INTERVENTIONS - Wound Cleansing / Measurement [name], [name] ([id number]) []  - 0 Simple Wound Cleansing - one wound X- 2 5 Complex  Wound Cleansing - multiple wounds X- 1 5 Wound Imaging (photographs - any number of wounds) []  - 0 Wound Tracing (instead of photographs) []  - 0 Simple Wound Measurement -  one wound X- 2 5 Complex Wound Measurement - multiple wounds INTERVENTIONS - Wound Dressings X - Small Wound Dressing one or multiple wounds 1 10 []  - 0 Medium Wound Dressing one or multiple wounds []  - 0 Large Wound Dressing one or multiple wounds X- 1 5 Application of Medications - topical []  - 0 Application of Medications - injection INTERVENTIONS - Miscellaneous []  - External ear exam 0 []  - 0 Specimen Collection (cultures, biopsies, blood, body fluids, etc.) []  - 0 Specimen(s) / Culture(s) sent or taken to Lab for analysis []  - 0 Patient Transfer (multiple staff / Civil Service fast streamer / Similar devices) []  - 0 Simple Staple / Suture removal (25 or less) []  - 0 Complex Staple / Suture removal (26 or more) []  - 0 Hypo / Hyperglycemic Management (close monitor of Blood Glucose) []  - 0 Ankle / Brachial Index (ABI) - do not check if billed separately X- 1 5 Vital Signs Has the patient been seen at the hospital within the last three years: Yes Total Score: 110 Level Of Care: New/Established - Level 3 Electronic Signature(s) Signed: 10/08/2019 1:36:47 PM By: Montey Hora Entered By: Montey Hora on 10/08/2019 08:27:24 [name] ([id number]) -------------------------------------------------------------------------------- Encounter Discharge Information Details Patient Name: [name] Date of Service: 10/08/2019 8:00 AM Medical Record Number: [id number] Patient Account Number: [id number] Date of Birth/Sex: 08/04/1980 (39 y.o. M) Treating RN: Montey Hora Primary Care Erma Raiche: [name] Other Clinician: Referring Griselda Tosh: [name] Treating Jakin Pavao/Extender: Tito Dine in Treatment: 148 Encounter Discharge Information Items Discharge Condition: Stable Ambulatory  Status: Ambulatory Discharge Destination: Home Transportation: Private Auto Accompanied By: self Schedule Follow-up Appointment: Yes Clinical Summary of Care: Electronic Signature(s) Signed: 10/08/2019 1:36:47 PM By: Montey Hora Entered By: Montey Hora on 10/08/2019 08:21:07 [name] ([id number]) -------------------------------------------------------------------------------- Lower Extremity Assessment Details Patient Name: [name] Date of Service: 10/08/2019 8:00 AM Medical Record Number: [id number] Patient Account Number: [id number] Date of Birth/Sex: [birth year] (39 y.o. M) Treating RN: Army Melia Primary Care Margaurite Salido: [name] Other Clinician: Referring Ivann Trimarco: [name] Treating Layney Gillson/Extender: Ricard Dillon Weeks in Treatment: 148 Edema Assessment Assessed: [Left: No] [Right: No] Edema: [Left: N] [Right: o] Vascular Assessment Pulses: Dorsalis Pedis Palpable: [Left:Yes] Electronic Signature(s) Signed: 10/08/2019 11:08:48 AM By: Army Melia Entered By: Army Melia on 10/08/2019 08:15:41 Nair, [name] ([id number]) -------------------------------------------------------------------------------- Multi Wound Chart Details Patient Name: [name] Date of Service: 10/08/2019 8:00 AM Medical Record Number: [id number] Patient Account Number: [id number] Date of Birth/Sex: Oct 29, 1980 (39 y.o. M) Treating RN: Montey Hora Primary Care Kalen Neidert: [name] Other Clinician: Referring Rondalyn Belford: [name] Treating [name]/Extender: Tito Dine in Treatment: 148 Vital Signs Height(in): 71 Pulse(bpm): 80 Weight(lbs): 338 Blood Pressure(mmHg): 146/84 Body Mass Index(BMI): 47 Temperature(F): 98.4 Respiratory Rate(breaths/min): 16 Photos: [N/A:N/A] Wound Location: Left, Lateral Lower Leg Left, Medial Ankle N/A Wounding Event: Gradually Appeared Gradually Appeared N/A Primary Etiology: Pyoderma Venous Leg Ulcer  N/A Comorbid History: Sleep Apnea, Hypertension, Colitis Sleep Apnea, Hypertension, Colitis N/A Date Acquired: 11/18/2015 09/18/2019 N/A Weeks of Treatment: 148 0 N/A Wound Status: Open Open N/A Measurements L x W x D (cm) 7.6x7.7x1 0.9x1.5x0.1 N/A Area (cm) : 45.962 1.06 N/A Volume (cm) : 45.962 0.106 N/A % Reduction in Area: -836.30% N/A N/A % Reduction in Volume: -1070.40% N/A N/A Classification: Full Thickness With Exposed Full Thickness Without Exposed N/A Support Structures Support Structures Exudate Amount: Medium Medium N/A Exudate Type: Purulent Serosanguineous N/A Exudate Color: yellow, brown, green red, brown N/A Wound Margin: Epibole Flat and Intact  N/A Granulation Amount: Large (67-100%) Small (1-33%) N/A Granulation Quality: Pink Red N/A Necrotic Amount: Small (1-33%) Large (67-100%) N/A Exposed Structures: Fat Layer (Subcutaneous Tissue) Fat Layer (Subcutaneous Tissue) N/A Exposed: Yes Exposed: Yes Muscle: Yes Fascia: No Fascia: No Tendon: No Tendon: No Muscle: No Joint: No Joint: No Bone: No Bone: No Epithelialization: None None N/A Treatment Notes Wound #1 (Left, Lateral Lower Leg) Notes gentamycin, BFD to lateral leg, TCA, BFD to medial ankle Wound #3 (Left, Medial Ankle) Brenes, Silvano ([id number]) Notes gentamycin, BFD to lateral leg, TCA, BFD to medial ankle Electronic Signature(s) Signed: 10/08/2019 12:54:43 PM By: Linton Ham MD Entered By: Linton Ham on 10/08/2019 08:29:44 [name] ([id number]) -------------------------------------------------------------------------------- Multi-Disciplinary Care Plan Details Patient Name: [name] Date of Service: 10/08/2019 8:00 AM Medical Record Number: [id number] Patient Account Number: [id number] Date of Birth/Sex: August 27, 1980 (39 y.o. M) Treating RN: Montey Hora Primary Care Alicianna Litchford: [name] Other Clinician: Referring Shamari Lofquist: [name] Treating Destan Franchini/Extender:  Tito Dine in Treatment: 148 Active Inactive Venous Leg Ulcer Nursing Diagnoses: Knowledge deficit related to disease process and management Potential for venous Insuffiency (use before diagnosis confirmed) Goals: Non-invasive venous studies are completed as ordered Date Initiated: 12/11/2016 Target Resolution Date: 03/13/2017 Goal Status: Active Patient will maintain optimal edema control Date Initiated: 12/11/2016 Target Resolution Date: 03/13/2017 Goal Status: Active Patient/caregiver will verbalize understanding of disease process and disease management Date Initiated: 12/11/2016 Target Resolution Date: 03/13/2017 Goal Status: Active Verify adequate tissue perfusion prior to therapeutic compression application Date Initiated: 12/11/2016 Target Resolution Date: 03/13/2017 Goal Status: Active Interventions: Assess peripheral edema status every visit. Compression as ordered Provide education on venous insufficiency Treatment Activities: Therapeutic compression applied : 12/11/2016 Notes: Wound/Skin Impairment Nursing Diagnoses: Impaired tissue integrity Knowledge deficit related to ulceration/compromised skin integrity Goals: Patient/caregiver will verbalize understanding of skin care regimen Date Initiated: 12/11/2016 Target Resolution Date: 03/13/2017 Goal Status: Active Ulcer/skin breakdown will have a volume reduction of 30% by week 4 Date Initiated: 12/11/2016 Target Resolution Date: 03/13/2017 Goal Status: Active Ulcer/skin breakdown will have a volume reduction of 50% by week 8 Date Initiated: 12/11/2016 Target Resolution Date: 03/13/2017 Goal Status: Active Ulcer/skin breakdown will have a volume reduction of 80% by week 12 Date Initiated: 12/11/2016 Target Resolution Date: 03/13/2017 Goal Status: Active Ulcer/skin breakdown will heal within 14 weeks Date Initiated: 12/11/2016 Target Resolution Date: 03/13/2017 Goal Status: Active DOVBER, ERNEST  ([id number]) Interventions: Assess patient/caregiver ability to obtain necessary supplies Assess patient/caregiver ability to perform ulcer/skin care regimen upon admission and as needed Assess ulceration(s) every visit Provide education on ulcer and skin care Treatment Activities: Skin care regimen initiated : 12/11/2016 Topical wound management initiated : 12/11/2016 Notes: Electronic Signature(s) Signed: 10/08/2019 1:36:47 PM By: Montey Hora Entered By: Montey Hora on 10/08/2019 08:18:31 [name] ([id number]) -------------------------------------------------------------------------------- Pain Assessment Details Patient Name: [name] Date of Service: 10/08/2019 8:00 AM Medical Record Number: [id number] Patient Account Number: [id number] Date of Birth/Sex: [birth year] (39 y.o. M) Treating RN: Army Melia Primary Care England Greb: [name] Other Clinician: Referring Steadman Prosperi: [name] Treating Camyla Camposano/Extender: Tito Dine in Treatment: 148 Active Problems Location of Pain Severity and Description of Pain Patient Has Paino Yes Site Locations Pain Location: Pain in Ulcers Rate the pain. Current Pain Level: 2 Pain Management and Medication Current Pain Management: Electronic Signature(s) Signed: 10/08/2019 11:08:48 AM By: Army Melia Entered By: Army Melia on 10/08/2019 08:09:43 [name] ([id number]) -------------------------------------------------------------------------------- Patient/Caregiver Education Details Patient Name: [name] Date of Service: 10/08/2019 8:00 AM Medical Record  Number: [id number] Patient Account Number: [id number] Date of Birth/Gender: Sep 13, 1980 (39 y.o. M) Treating RN: Montey Hora Primary Care Physician: [name] Other Clinician: Referring Physician: [name] Treating Physician/Extender: Tito Dine in Treatment: 64 Education Assessment Education Provided  To: Patient Education Topics Provided Wound/Skin Impairment: Handouts: Other: wound care as ordered Methods: Explain/Verbal Responses: State content correctly Electronic Signature(s) Signed: 10/08/2019 1:36:47 PM By: Montey Hora Entered By: Montey Hora on 10/08/2019 08:20:46 [name] ([id number]) -------------------------------------------------------------------------------- Wound Assessment Details Patient Name: [name] Date of Service: 10/08/2019 8:00 AM Medical Record Number: [id number] Patient Account Number: [id number] Date of Birth/Sex: 11/11/1980 (38 y.o. M) Treating RN: Army Melia Primary Care Saranne Crislip: [name] Other Clinician: Referring Aasiya Creasey: [name] Treating Onelia Cadmus/Extender: Tito Dine in Treatment: 148 Wound Status Wound Number: 1 Primary Etiology: Pyoderma Wound Location: Left, Lateral Lower Leg Wound Status: Open Wounding Event: Gradually Appeared Comorbid History: Sleep Apnea, Hypertension, Colitis Date Acquired: 11/18/2015 Weeks Of Treatment: 148 Clustered Wound: No Photos Wound Measurements Length: (cm) 7.6 Width: (cm) 7.7 Depth: (cm) 1 Area: (cm) 45.962 Volume: (cm) 45.962 % Reduction in Area: -836.3% % Reduction in Volume: -1070.4% Epithelialization: None Wound Description Classification: Full Thickness With Exposed Support Structures Wound Margin: Epibole Exudate Amount: Medium Exudate Type: Purulent Exudate Color: yellow, brown, green Foul Odor After Cleansing: No Slough/Fibrino Yes Wound Bed Granulation Amount: Large (67-100%) Exposed Structure Granulation Quality: Pink Fascia Exposed: No Necrotic Amount: Small (1-33%) Fat Layer (Subcutaneous Tissue) Exposed: Yes Necrotic Quality: Adherent Slough Tendon Exposed: No Muscle Exposed: Yes Necrosis of Muscle: No Joint Exposed: No Bone Exposed: No Treatment Notes Wound #1 (Left, Lateral Lower Leg) Notes gentamycin, BFD to lateral leg,  TCA, BFD to medial ankle Pothier, Derell ([id number]) Electronic Signature(s) Signed: 10/08/2019 11:08:48 AM By: Army Melia Entered By: Army Melia on 10/08/2019 08:12:33 [name] ([id number]) -------------------------------------------------------------------------------- Wound Assessment Details Patient Name: [name] Date of Service: 10/08/2019 8:00 AM Medical Record Number: [id number] Patient Account Number: [id number] Date of Birth/Sex: Mar 11, 1981 (39 y.o. M) Treating RN: Army Melia Primary Care Esiah Bazinet: [name] Other Clinician: Referring Nature Kueker: [name] Treating Iwao Shamblin/Extender: Tito Dine in Treatment: 148 Wound Status Wound Number: 3 Primary Etiology: Venous Leg Ulcer Wound Location: Left, Medial Ankle Wound Status: Open Wounding Event: Gradually Appeared Comorbid History: Sleep Apnea, Hypertension, Colitis Date Acquired: 09/18/2019 Weeks Of Treatment: 0 Clustered Wound: No Photos Wound Measurements Length: (cm) 0.9 Width: (cm) 1.5 Depth: (cm) 0.1 Area: (cm) 1.06 Volume: (cm) 0.106 % Reduction in Area: % Reduction in Volume: Epithelialization: None Tunneling: No Undermining: No Wound Description Classification: Full Thickness Without Exposed Support Structu Wound Margin: Flat and Intact Exudate Amount: Medium Exudate Type: Serosanguineous Exudate Color: red, brown res Foul Odor After Cleansing: No Slough/Fibrino Yes Wound Bed Granulation Amount: Small (1-33%) Exposed Structure Granulation Quality: Red Fascia Exposed: No Necrotic Amount: Large (67-100%) Fat Layer (Subcutaneous Tissue) Exposed: Yes Necrotic Quality: Adherent Slough Tendon Exposed: No Muscle Exposed: No Joint Exposed: No Bone Exposed: No Treatment Notes Wound #3 (Left, Medial Ankle) Notes gentamycin, BFD to lateral leg, TCA, BFD to medial ankle Electronic Signature(s) Signed: 10/08/2019 11:08:48 AM By: Wynetta Emery, [name]  ([id number]) Entered By: Army Melia on 10/08/2019 08:14:13 [name] ([id number]) -------------------------------------------------------------------------------- Vitals Details Patient Name: [name] Date of Service: 10/08/2019 8:00 AM Medical Record Number: [id number] Patient Account Number: [id number] Date of Birth/Sex: [birth year] (39 y.o. M) Treating RN: Army Melia Primary Care Haydin Dunn: [name] Other Clinician: Referring Kailana Benninger: [name] Treating Renna Kilmer/Extender: Ricard Dillon  Weeks in Treatment: 148 Vital Signs Time Taken: 08:06 Temperature (F): 98.4 Height (in): 71 Pulse (bpm): 80 Weight (lbs): 338 Respiratory Rate (breaths/min): 16 Body Mass Index (BMI): 47.1 Blood Pressure (mmHg): 146/84 Reference Range: 80 - 120 mg / dl Electronic Signature(s) Signed: 10/08/2019 11:08:48 AM By: Army Melia Entered By: Army Melia on 10/08/2019 08:07:59

## 2019-10-08 NOTE — Progress Notes (Signed)
[name], [name] ([id number]) Visit Report for 10/08/2019 HPI Details Patient Name: [name], ON Date of Service: 10/08/2019 8:00 AM Medical Record Number: [id number] Patient Account Number: [id number] Date of Birth/Sex: July 29, 1980 (39 y.o. M) Treating RN: Montey Hora Primary Care Provider: [name] Other Clinician: Referring Provider: [name] Treating Provider/Extender: Tito Dine in Treatment: 148 History of Present Illness HPI Description: 12/04/16; 39 year old man who comes into the clinic today for review of a wound on the posterior left calf. He tells me that is been there for about a year. He is not a diabetic he does smoke half a pack per day. He was seen in the ER on 11/20/16 felt to have cellulitis around the wound and was given clindamycin. An x-ray did not show osteomyelitis. The patient initially tells me that he has a milk allergy that sets off a pruritic itching rash on his lower legs which she scratches incessantly and he thinks that's what may have set up the wound. He has been using various topical antibiotics and ointments without any effect. He works in a [employer] and is on his feet all day. He does not have a prior history of wounds however he does have the rash on both lower legs the right arm and the ventral aspect of his left arm. These are excoriations and clearly have had scratching however there are of macular looking areas on both legs including a substantial larger area on the right leg. This does not have an underlying open area. There is no blistering. The patient tells me that 2 years ago in [place] in response to the rash on his legs he saw a dermatologist who told him he had a condition which may be pyoderma gangrenosum although I may be putting words into his mouth. He seemed to recognize this. 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 He is not in any treatment for this. He's had no recent  travel 12/11/16; the patient arrives today with his wound and roughly the same condition we've been using silver alginate this is a deep punched out wound with some surrounding erythema but no tenderness. Biopsy I did did not show confirmed pyoderma gangrenosum suggested nonspecific inflammation and vasculitis but does not provide an actual description of what was seen by the pathologist. I'm really not able to understand this We have also received information from the patient's dermatologist in [place] notes from April 2016. This was a doctor Agarwal-antal. 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 He was prescribed topical steroid high potency under occlusion which helped but at this point the patient did not have a deep punched out wound. 12/18/16; the patient's wound is larger in terms of surface area however this surface looks better and there is less depth. The surrounding erythema also is better. The patient states that the wrap we put on came off 2 days ago when he has been using his compression stockings. He we are in the process of getting a dermatology consult. 12/26/16 on evaluation today patient's left lower extremity wound shows evidence of infection with surrounding erythema noted. He has been tolerating the dressing changes but states that he has noted more discomfort. There is a larger area of erythema surrounding the wound. No fevers, chills, nausea, or vomiting noted at this time. With that being said the wound still does have slough covering the surface. He is not allergic to any medication that he is aware of at this point.  In regard to his right lower extremity he had several regions that are erythematous and pruritic he wonders if there's anything we can do to help that. 01/02/17 I reviewed patient's wound culture which was obtained his visit last week. He was placed on doxycycline at that point. Unfortunately that does not appear to be an antibiotic that would likely  help with the situation however the pseudomonas noted on culture is sensitive to Cipro. Also unfortunately patient's wound seems to have a large compared to last week's evaluation. Not severely so but there are definitely increased measurements in general. He is continuing to have discomfort as well he writes this to be a seven out of 10. In fact he would prefer me not to perform any debridement today due to the fact that he is having discomfort and considering he has an active infection on the little reluctant to do so anyway. No fevers, chills, nausea, or vomiting noted at this time. 01/08/17; patient seems dermatology on September 5. I suspect dermatology will want the slides from the biopsy I did sent to their pathologist. I'm not sure if there is a way we can expedite that. In any case the culture I did before I left on vacation 3 weeks ago showed Pseudomonas he was given 10 days of Cipro and per her description of her intake nurses is actually somewhat better this week although the wound is quite a bit bigger than I remember the last time I saw this. He still has 3 more days of Cipro 01/21/17; dermatology appointment tomorrow. He has completed the ciprofloxacin for Pseudomonas. Surface of the wound looks better however he is had some deterioration in the lesions on his right leg. Meantime the left lateral leg wound we will continue with sample 01/29/17; patient had his dermatology appointment but I can't yet see that note. He is completed his antibiotics. The wound is more superficial but considerably larger in circumferential area than when he came in. This is in his left lateral calf. He also has swollen erythematous areas with superficial wounds on the right leg and small papular areas on both arms. There apparently areas in her his upper thighs and buttocks I did not look at those. Dermatology biopsied the right leg. Hopefully will have their input next week. 02/05/17; patient went back to see  his dermatologist who told him that he had a "scratching problem" as well as staph. He is now on a 30 day course of doxycycline and I believe she gave him triamcinolone cream to the right leg areas to help with the itching [not exactly sure but probably triamcinolone]. She apparently looked at the left lateral leg wound although this was not rebiopsied and I think felt to be ultimately part of the same pathogenesis. He is using sample border foam and changing nevus himself. He now has a new open area on the right posterior leg which was his biopsy site I don't have any of the dermatology notes 02/12/17; we put the patient in compression last week with SANTYL to the wound on the left leg and the biopsy. Edema is much better and the depth of the wound is now at level of skin. Area is still the same oBiopsy site on the right lateral leg we've also been using santyl with a border foam dressing and he is changing this himself. 02/19/17; Using silver alginate started last week to both the substantial left leg wound and the biopsy site on the right wound. He is tolerating compression  well. Has a an appointment with his primary M.D. tomorrow wondering about diuretics although I'm wondering if the edema problem is actually lymphedema 02/26/17; the patient has been to see his primary doctor Dr. Jerrel Ivory at [place] our primary care. She started him on Lasix 20 mg and this Delair, Joncarlo ([id number]) seems to have helped with the edema. However we are not making substantial change with the left lateral calf wound and inflammation. The biopsy site on the right leg also looks stable but not really all that different. 03/12/17; the patient has been to see vein and vascular Dr. Lucky Cowboy. He has had venous reflux studies I have not reviewed these. I did get a call from his dermatology office. They felt that he might have pathergy based on their biopsy on his right leg which led them to look at the slides of the biopsy  I did on the left leg and they wonder whether this represents pyoderma gangrenosum which was the original supposition in a man with ulcerative colitis albeit inactive for many years. They therefore recommended clobetasol and tetracycline i.e. 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 03/26/17; apparently the patient just had reflux studies not an appointment with Dr. dew. She arrives in clinic today having applied clobetasol for 2-3 weeks. He notes over the last 2-3 days excessive drainage having to change the dressing 3-4 times a day and also expanding erythema. 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 Lillia Carmel at [facility] dermatology locally who kindly saw him at my request. 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 My concern was the degree of erythema medially extending from his left leg wound which was either inflammation from pyoderma or cellulitis. I put him on Augmentin however culture of the wound showed Pseudomonas which is quinolone sensitive. I really don't believe he has cellulitis however in view of everything I will continue and give him a course of Cipro. He is also on doxycycline as an immune modulator for the pyoderma. In addition to his original wound on the left lateral leg with surrounding erythema he has a wound on the right posterior calf which was an original biopsy site done by dermatology. 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 Saw Dr. Lillia Carmel yesterday. He has been using topical antibiotics to both wound areas his original wound on the left and the biopsies/pathergy area on the right. There  is definitely some improvement in the inflammation around the wound on the right although the patient states he has increasing sensitivity of the wounds. He is on prednisone 60 and doxycycline 1 as prescribed by Dr. Lillia Carmel. He is covering the topical antibiotic with gauze and putting this in his own compression stocks and changing this daily. He states that Dr. Lottie Rater did a culture of the left leg wound yesterday 05/07/17; pyoderma gangrenosum. The patient saw Dr. Lillia Carmel yesterday and has a follow-up with her in one month. He is still using topical antibiotics to both wounds although he can't recall exactly what type. He is still on prednisone 60 mg. Dr. Lillia Carmel stated that the doxycycline could stop if we were in agreement. 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 The right medial leg was induced by biopsy/pathergy. The area on the right is essentially healed. 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 The right medial leg has closed and remains closed. He is still on prednisone 60. oHe tells me he missed his last dermatology appointment with Dr. Lillia Carmel but will make another appointment. He reports that her blood sugar at a recent screen in [place] was high 200's. He was 180 today. 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 I think he is going to require still much longer prednisone perhaps another 3 months before attempting to taper. In the meantime his wound is a lot better. Smaller. He is cleaning this off daily and applying topical antibiotics. When he was last in the clinic I thought about changing to [facility] and actually put in a couple of calls to dermatology although probably not during their business hours. In any case the wound looks better smaller I don't think there is  any need to change what he is doing 08/06/17-he is here in follow up evaluation for pyoderma left leg ulcer. He continues on oral prednisone. He has been using triple antibiotic ointment. There is surface debris and we will transition to [facility] and have him return in 2 weeks. He has lost 30 pounds since his last appointment with lifestyle modification. He may benefit from topical steroid cream for treatment this can be considered at a later date. 08/22/17 on evaluation today patient appears to actually be doing rather well in regard to his left lateral lower extremity ulcer. He has actually been managed by Dr. Dellia Nims most recently. Patient is currently on oral steroids at this time. This seems to have been of benefit for him. Nonetheless his last visit was actually with Leah on 08/06/17. Currently he is not utilizing any topical steroid creams although this could be of benefit as well. No fevers, chills, nausea, or vomiting noted at this time. 09/05/17 on evaluation today patient appears to be doing better in regard to his left lateral lower extremity ulcer. He has been tolerating the dressing changes without complication. He is using Santyl with good effect. Overall I'm very pleased with how things are standing at this point. Patient likewise is happy that this is doing better. 09/19/17 on evaluation today patient actually appears to be doing rather well in regard to his left lateral lower extremity ulcer. 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 He's not having any significant pain. 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 He has been tolerating the Santyl without complication and in general I feel like he's making good progress. 10/17/17 on evaluation today patient appears to be doing very well in regard to his left  lateral lower surety ulcer. He has been tolerating the dressing changes without complication. There does not appear to be any evidence of infection he's alternating the Santyl and the triple antibiotic ointment every other day this seems to be doing well for him. 11/03/17 on evaluation today patient appears to be doing very well in regard to his left lateral lower extremity ulcer. He is been tolerating the dressing changes without complication which is good news. Fortunately there does not  appear to be any evidence of infection which is also great news. Overall is doing excellent they are starting to taper down on the prednisone is down to 40 mg at this point it also started topical clobetasol for him. 11/17/17 on evaluation today patient appears to be doing well in regard to his left lateral lower surety ulcer. He's been tolerating the dressing changes without complication. He does note that he is having no pain, no excessive drainage or discharge, and overall he feels like things are going about how he would expect and hope they would. Overall he seems to have no evidence of infection at this time in my opinion which is good news. 12/04/17-He is seen in follow-up evaluation for right lateral lower extremity ulcer. He has been applying topical steroid cream. Today's measurement show slight increase in size. Over the next 2 weeks we will transition to every other day Santyl and steroid cream. He has been encouraged to monitor for changes and notify clinic with any concerns 12/15/17 on evaluation today patient's left lateral motion the ulcer and fortunately is doing worse again at this point. This just since last week to this week has close to doubled in size according to the patient. I did not seeing last week's I do not have a visual to compare this to in our system was also down so we do not have all the charts and at this point. Nonetheless it does have me somewhat concerned in regard to the fact  that again he was worried enough about it he has contact the dermatology that placed them back on the full strength, 50 mg a day of the prednisone that he was taken previous. He continues to alternate using clobetasol along with Santyl at this point. He is obviously somewhat frustrated. MURDOCK, JELLISON ([id number]) 12/22/17 on evaluation today patient appears to be doing a little worse compared to last evaluation. Unfortunately the wound is a little deeper and slightly larger than the last week's evaluation. With that being said he has made some progress in regard to the irritation surrounding at this time unfortunately despite that progress that's been made he still has a significant issue going on here. 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 For that reason I discussed with him today the possibility of perform a wound culture to ensure there's nothing overtly infected. 01/06/18 on evaluation today patient's wound is larger and deeper than previously evaluated. With that being said it did appear that his wound was infected after my last evaluation with him. Subsequently I did end up prescribing a prescription for Bactrim DS which she has been taking and having no complication with. Fortunately there does not appear to be any evidence of infection at this point in time as far as anything spreading, no want to touch, and overall I feel like things are showing signs of improvement. 01/13/18 on evaluation today patient appears to be even a little larger and deeper than last time. There still muscle exposed in the base of the wound. Nonetheless he does appear to be less erythematous I do believe inflammation is calming down also believe the infection looks like it's probably resolved at this time based on what I'm seeing. No fevers, chills, nausea, or  vomiting noted at this time. 01/30/18 on evaluation today patient actually appears to visually look better for the most part. Unfortunately  those visually this looks better he does seem to potentially have what may be an abscess in the muscle that has been noted in the central portion of the wound. This is the first time that I have noted what appears to be fluctuance in the central portion of the muscle. With that being said I'm somewhat more concerned about the fact that this might indicate an abscess formation at this location. I do believe that an ultrasound would be appropriate. This is likely something we need to try to do as soon as possible. He has been switch to mupirocin ointment and he is no longer using the steroid ointment as prescribed by dermatology he sees them again next week he's been decreased from 60 to 40 mg of prednisone. 03/09/18 on evaluation today patient actually appears to be doing a little better compared to last time I saw him. There's not as much erythema surrounding the wound itself. He I did review his most recent infectious disease note which was dated 02/24/18. He saw Dr. Michel Bickers in [place]. 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 Patient is now off of antibiotics and they are continually observing this. There seems to be no change in the past two weeks in my pinion based on what the patient says and what I see today compared to what Dr. Megan Salon likely saw two weeks ago. No fevers, chills, nausea, or vomiting noted at this time. 03/23/18 on evaluation today patient's wound actually appears to be showing signs of improvement which is good news. He is currently still on the Dapsone. He is also working on tapering the prednisone to get off of this and Dr. Lottie Rater is working with him in this regard. Nonetheless overall I feel like the wound is doing well it does appear  based on the infectious disease note that I reviewed from Dr. 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 I did also review the results of his ultrasound of left lower extremity which revealed there was a dentist tissue in the base of the wound without an abscess noted. 04/06/18 on evaluation today the patient's left lateral lower extremity ulcer actually appears to be doing fairly well which is excellent news. There does not appear to be any evidence of infection at this time which is also great news. Overall he still does have a significantly large ulceration although little by little he seems to be making progress. He is down to 10 mg a day of the prednisone. 04/20/18 on evaluation today patient actually appears to be doing excellent at this time in regard to his left lower extremity ulcer. He's making signs of good progress unfortunately this is taking much longer than we would really like to see but nonetheless he is making progress. Fortunately there does not appear to be any evidence of infection at this time. No fevers, chills, nausea, or vomiting noted at this time. The patient has not been using the Santyl due to the cost he hadn't got in this field yet. He's mainly been using the antibiotic ointment topically. Subsequently he also tells me that he really has not been scrubbing in the shower I think this would be helpful again as I told him it doesn't have to be anything too aggressive to even make it  believe just enough to keep it free of some of the loose slough and biofilm on the wound surface. 05/11/18 on evaluation today patient's wound appears to be making slow but sure progress in regard to the left lateral lower extremity ulcer. He is been tolerating the dressing changes without complication. Fortunately there does not appear to be any evidence of infection at this time. He  is still just using triple antibiotic ointment along with clobetasol occasionally over the area. He never got the Santyl and really does not seem to intend to in my pinion. 06/01/18 on evaluation today patient appears to be doing a little better in regard to his left lateral lower extremity ulcer. He states that overall he does not feel like he is doing as well with the Dapsone as he did with the prednisone. Nonetheless he sees his dermatologist later today and is gonna talk to them about the possibility of going back on the prednisone. Overall again I believe that the wound would be better if you would utilize Santyl but he really does not seem to be interested in going back to the [place] at this point. He has been using triple antibiotic ointment. 06/15/18 on evaluation today patient's wound actually appears to be doing about the same at this point. Fortunately there is no signs of infection at this time. He has made slight improvements although he continues to not really want to clean the wound bed at this point. He states that he just doesn't mess with it he doesn't want to cause any problems with everything else he has going on. He has been on medication, antibiotics as prescribed by his dermatologist, for a staff infection of his lower extremities which is really drying out now and looking much better he tells me. Fortunately there is no sign of overall infection. 06/29/18 on evaluation today patient appears to be doing well in regard to his left lateral lower surety ulcer all things considering. Fortunately his staff infection seems to be greatly improved compared to previous. He has no signs of infection and this is drying up quite nicely. He is still the doxycycline for this is no longer on cental, Dapsone, or any of the other medications. His dermatologist has recommended possibility of an infusion but right now he does not want to proceed with that. 07/13/18 on evaluation today patient appears  to be doing about the same in regard to his left lateral lower surety ulcer. Fortunately there's no signs of infection at this time which is great news. Unfortunately he still builds up a significant amount of Slough/biofilm of the surface of the wound he still is not really cleaning this as he should be appropriately. Again I'm able to easily with saline and gauze remove the majority of this on the surface which if you would do this at home would likely be a dramatic improvement for him as far as getting the area to improve. Nonetheless overall I still feel like he is making progress is just very slow. I think Santyl will be of benefit for him as well. Still he has not gotten this as of this point. 07/27/18 on evaluation today patient actually appears to be doing little worse in regards of the erythema around the periwound region of the wound he also tells me that he's been having more drainage currently compared to what he was experiencing last time I saw him. He states not quite as bad as what he had because this was infected previously but nonetheless  is still appears to be doing poorly. Fortunately there is no evidence of systemic infection at this point. The patient tells me that he is not going to be able to afford the Santyl. He is still waiting to hear about the infusion therapy with his dermatologist. Apparently she wants an updated colonoscopy first. GOBLE, FUDALA ([id number]) 08/10/18 on evaluation today patient appears to be doing better in regard to his left lateral lower extremity ulcer. Fortunately he is showing signs of improvement in this regard he's actually been approved for Remicade infusion's as well although this has not been scheduled as of yet. Fortunately there's no signs of active infection at this time in regard to the wound although he is having some issues with infection of the right lower extremity is been seen as dermatologist for this. Fortunately they are definitely still  working with him trying to keep things under control. 09/07/18 on evaluation today patient is actually doing rather well in regard to his left lateral lower extremity ulcer. He notes these actually having some hair grow back on his extremity which is something he has not seen in years. He also tells me that the pain is really not giving them any trouble at this time which is also good news overall she is very pleased with the progress he's using a combination of the mupirocin along with the probate is all mixed. 09/21/18 on evaluation today patient actually appears to be doing fairly well all things considered in regard to his looks from the ulcer. He's been tolerating the dressing changes without complication. Fortunately there's no signs of active infection at this time which is good news he is still on all antibiotics or prevention of the staff infection. He has been on prednisone for time although he states it is gonna contact his dermatologist and see if she put them on a short course due to some irritation that he has going on currently. Fortunately there's no evidence of any overall worsening this is going very slow I think cental would be something that would be helpful for him although he states that $50 for tube is quite expensive. He therefore is not willing to get that at this point. 10/06/18 on evaluation today patient actually appears to be doing decently well in regard to his left lateral leg ulcer. He's been tolerating the dressing changes without complication. Fortunately there's no signs of active infection at this time. Overall I'm actually rather pleased with the progress he's making although it's slow he doesn't show any signs of infection and he does seem to be making some improvement. I do believe that he may need a switch up and dressings to try to help this to heal more appropriately and quickly. 10/19/18 on evaluation today patient actually appears to be doing better in regard to  his left lateral lower extremity ulcer. This is shown signs of having much less Slough buildup at this point due to the fact he has been using the [employer]. Obviously this is very good news. The overall size of the wound is not dramatically smaller but again the appearance is. 11/02/18 on evaluation today patient actually appears to be doing quite well in regard to his lower [place] ulcer. A lot of the skin around the ulcer is actually somewhat irritating at this point this seems to be more due to the dressing causing irritation from the adhesive that anything else. Fortunately there is no signs of active infection at this time. 11/24/18 on evaluation today patient appears  to be doing a little worse in regard to his overall appearance of his lower extremity ulcer. There's more erythema and warmth around the wound unfortunately. He is currently on doxycycline which he has been on for some time. With that being said I'm not sure that seems to be helping with what appears to possibly be an acute cellulitis with regard to his left lower extremity ulcer. No fevers, chills, nausea, or vomiting noted at this time. 12/08/18 on evaluation today patient's wounds actually appears to be doing significantly better compared to his last evaluation. He has been using Santyl along with alternating tripling about appointment as well as the steroid cream seems to be doing quite well and the wound is showing signs of improvement which is excellent news. Fortunately there's no evidence of infection and in fact his culture came back negative with only normal skin flora noted. 12/21/2018 upon evaluation today patient actually appears to be doing excellent with regard to his ulcer. This is actually the best that I have seen it since have been helping to take care of him. It is both smaller as well as less slough noted on the surface of the wound and seems to be showing signs of good improvement with new skin growing from the edges.  He has been using just the triamcinolone he does wonder if he can get a refill of that ointment today. 01/04/2019 upon evaluation today patient actually appears to be doing well with regard to his left lateral lower extremity ulcer. With that being said it does not appear to be that he is doing quite as well as last time as far as progression is concerned. There does not appear to be any signs of infection or significant irritation which is good news. With that being said I do believe that he may benefit from switching to a collagen based dressing based on how clean The wound appears. 01/18/2019 on evaluation today patient actually appears to be doing well with regard to his wound on the left lower extremity. He is not made a lot of progress compared to where we were previous but nonetheless does seem to be doing okay at this time which is good news. There is no signs of active infection which is also good news. My only concern currently is I do wish we can get him into utilizing the collagen dressing his insurance would not pay for the supplies that we ordered although it appears that he may be able to order this through his supply company that he typically utilizes. This is [employer]. Nonetheless he did try to order it during the office visit today and it appears this did go through. We will see if he can get that it is a different brand but nonetheless he has collagen and I do think will be beneficial. 02/01/2019 on evaluation today patient actually appears to be doing a little worse today in regard to the overall size of his wounds. Fortunately there is no signs of active infection at this time. That is visually. Nonetheless when this is happened before it was due to infection. For that reason were somewhat concerned about that this time as well. 02/08/2019 on evaluation today patient unfortunately appears to be doing slightly worse with regard to his wound upon evaluation today. Is measuring a little  deeper and a little larger unfortunately. I am not really sure exactly what is causing this to enlarge he actually did see his dermatologist she is going to see about initiating Humira  for him. Subsequently she also did do steroid injections into the wound itself in the periphery. Nonetheless still nonetheless he seems to be getting a little bit larger he is gone back to just using the steroid cream topically which I think is appropriate. I would say hold off on the collagen for the time being is definitely a good thing to do. Based on the culture results which we finally did get the final result back regarding it shows staph as the bacteria noted again that can be a normal skin bacteria based on the fact however he is having increased drainage and worsening of the wound measurement wise I would go ahead and place him on an antibiotic today I do believe for this. 02/15/2019 on evaluation today patient actually appears to be doing somewhat better in regard to his ulcer. There is no signs of worsening at this time I did review his culture results which showed evidence of Staphylococcus aureus but not MRSA. Again this could just be more related to the normal skin bacteria although he states the drainage has slowed down quite a bit he may have had a mild infection not just colonization. And was much smaller and then since around10/04/2019 on evaluation today patient appears to be doing unfortunately worse as far as the size of the wound. I really feel like that this is steadily getting larger again it had been doing excellent right at the beginning of September we have seen a steady increase in the area of the wound it is almost 2-1/2 times the size it was on September 1. Obviously this is a bad trend this is not wanting to see. For that reason we went back to using just the topical triamcinolone cream which does seem to help with inflammation. I checked him for bacteria by way of culture and nothing showed  positive there. I am considering giving him a short course of a tapering steroid Dosepak today to see if that is can be beneficial for him. The patient is in agreement with giving that a try. FORTUNATO, NORDIN ([id number]) 03/08/2019 on evaluation today patient appears to be doing very well in comparison to last evaluation with regard to his lower extremity ulcer. This is showing signs of less inflammation and actually measuring slightly smaller compared to last time every other week over the past month and a half he has been measuring larger larger larger. Nonetheless I do believe that the issue has been inflammation the prednisone does seem to have been beneficial for him which is good news. No fevers, chills, nausea, vomiting, or diarrhea. 03/22/2019 on evaluation today patient appears to be doing about the same with regard to his leg ulcer. He has been tolerating the dressing changes without complication. With that being said the wound seems to be mostly arrested at its current size but really is not making any progress except for when we prescribed the prednisone. He did show some signs of dropping as far as the overall size of the wound during that interval week. Nonetheless this is something he is not on long-term at this point and unfortunately I think he is getting need either this or else the Humira which his dermatologist has discussed try to get approval for. With that being said he will be seeing his dermatologist on the 11th of this month that is November. 04/19/2019 on evaluation today patient appears to be doing really about the same the wound is measuring slightly larger compared to last time I saw him.  He has not been into the office since November 2 due to the fact that he unfortunately had Covid as that his entire family. He tells me that it was rough but they did pull-through and he seems to be doing much better. Fortunately there is no signs of active infection at this time.  No fevers, chills, nausea, vomiting, or diarrhea. 05/10/2019 on evaluation today patient unfortunately appears to be doing significantly worse as compared to last time I saw him. He does tell me that he has had his first dose of Humira and actually is scheduled to get the next one in the upcoming week. With that being said he tells me also that in the past several days he has been having a lot of issues with green drainage she showed me a picture this is more blue-green in color. He is also been having issues with increased sloughy buildup and the wound does appear to be larger today. Obviously this is not the direction that we want everything to take based on the starting of his Humira. Nonetheless I think this is definitely a result of likely infection and to be honest I think this is probably Pseudomonas causing the infection based on what I am seeing. 05/24/2019 on evaluation today patient unfortunately appears to be doing significantly worse compared to his prior evaluation with me 2 weeks ago. I did review his culture results which showed that he does have Staph aureus as well as Pseudomonas noted on the culture. Nonetheless the Levaquin that I prescribed for him does not appear to have been appropriate and in fact he tells me he is no longer experiencing the green drainage and discharge that he had at the last visit. Fortunately there is no signs of active infection at this time which is good news although the wound has significantly worsened it in fact is much deeper than it was previous. We have been utilizing up to this point triamcinolone ointment as the prescription topical of choice but at this time I really feel like that the wound is getting need to be packed in order to appropriately manage this due to the deeper nature of the wound. Therefore something along the lines of an alginate dressing may be more appropriate. 05/31/2019 upon inspection today patient's wound actually showed signs  of doing poorly at this point. Unfortunately he just does not seem to be making any good progress despite what we have tried. He actually did go ahead and pick up the Cipro and start taking that as he was noticing more green drainage he had previously completed the Levaquin that I prescribed for him as well. Nonetheless he missed his appointment for the seventh last week on Wednesday with the wound care center and [facility] where his dermatologist referred him. Obviously I do think a second opinion would be helpful at this point especially in light of the fact that the patient seems to be doing so poorly despite the fact that we have tried everything that I really know how at this point. The only thing that ever seems to have helped him in the past is when he was on high doses of continual steroids that did seem to make a difference for him. 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 06/07/2019 upon evaluation today patient unfortunately  appears to be doing worse yet again with regard to his wound. In fact I am starting to question whether or not he may have a fluid pocket in the muscle at this point based on the bulging and the soft appearance to the central portion of the muscle area. There is not anything draining from the muscle itself at this time which is good news but nonetheless the wound is expanding. I am not really seeing any results of the Humira as far as overall wound progression based on what I am seeing at this point. The patient has been referred for second opinion with regard to his wound to the [facility] wound care center by his dermatologist which I definitely am not in opposition to. Unfortunately we tried multiple dressings in the past including collagen, alginate, and at one point even Hydrofera Blue. With that being said he  is never really used it for any significant amount of time due to the fact that he often complains of pain associated with these dressings and then will go back to either using the Santyl which she has done intermittently or more frequently the triamcinolone. He is also using his own compression stockings. We have wrapped him in the past but again that was something else that he really was not a big fan of. 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 He seemed to do much better when he was on the steroids although honestly I understand that the steroids are not necessarily the best medication to be on long-term obviously 06/14/2019 on evaluation today patient appears to be doing actually a little bit better with regard to the overall appearance with his leg. Unfortunately he does continue to have issues with what appears to be some fluid underneath the muscle although he did see the wound specialty center at [facility] last week their main goals were to see about infusion therapy in place of the Humira as they feel like that is not quite strong enough. They also recommended that we continue with the treatment otherwise as we are they felt like that was appropriate and they are okay with him continuing to follow-up here with [place] in that regard. With that being said they are also sending him to the vein specialist there to see about vein stripping and if that would be of benefit for him. Subsequently they also did not really address whether or not an ultrasound of the muscle area to see if there is anything that needs to be addressed here would be appropriate or not. For that reason I discussed  this with him last week I think we may proceed down that road at this point. 06/21/2019 upon evaluation today patient's wound actually appears to be doing slightly better compared to previous evaluations. I do believe that he has made a difference with regard to the progression here with the use of oral steroids. Again in the past has been the only thing that is really calm things down. He does tell me that from [facility] is gotten a good news from there that there are no further vein stripping that is necessary at this point. I do not  have that available for review today although the patient did relay this to me. He also did obtain and have the ultrasound of the wound completed which I did sign off on today. It does appear that there is no fluid collection under the muscle this is likely then just edematous tissue in general. That is also good news. Overall I still believe the inflammation is the main issue here. He did inquire about the possibility of a wound VAC again with the muscle protruding like it is I am not really sure whether the wound VAC is necessarily ideal or not. That is something we will have to consider although I do believe he may need compression wrapping to try to help with edema control which could potentially be of benefit. 06/28/2019 on evaluation today patient appears to be doing slightly better measurement wise although this is not terribly smaller he least seems to be trending towards that direction. With that being said he still seems to have purulent drainage noted in the wound bed at this time. He has been on Levaquin followed by Cipro over the past month. Unfortunately he still seems to have some issues with active infection at this time. I did perform a culture last week in order to evaluate and see if indeed there was still anything going on. Subsequently the culture did come back showing Pseudomonas which is consistent with the drainage has been having which is blue-green in  color. He also has had an odor that again was KODEN, HUNZEKER ([id number]) somewhat consistent with Pseudomonas as well. Long story short it appears that the culture showed an intermediate finding with regard to how well the Cipro will work for the Pseudomonas infection. Subsequently being that he does not seem to be clearing up and at best what we are doing is just keeping this at [place] I think he may need to see infectious disease to discuss IV antibiotic options. 07/05/2019 upon evaluation today patient appears to be doing okay in regard to his leg ulcer. He has been tolerating the dressing changes at this point without complication. Fortunately there is no signs of active infection at this time which is good news. No fevers, chills, nausea, vomiting, or diarrhea. With that being said he does have an appointment with infectious disease tomorrow and his primary care on Wednesday. Again the reason for the infectious disease referral was due to the fact that he did not seem to be fully resolving with the use of oral antibiotics and therefore we were thinking that IV antibiotic therapy may be necessary secondary to the fact that there was an intermediate finding for how effective the Cipro may be. Nonetheless again he has been having a lot of purulent and even green drainage. Fortunately right now that seems to have calmed down over the past week with the reinitiation of the oral antibiotic. Nonetheless we will see what Dr. Megan Salon has to say. 07/12/2019 upon evaluation today patient appears to be doing about the same at this point in regard to his left lower extremity ulcer. Fortunately there is no signs of active infection at this time which is good news I do believe the Levaquin has been beneficial I did review Dr. Hale Bogus note and to be honest I agree that the patient's leg does appear to be doing better currently. What we found in the past as he does not seem to really completely resolve he will stop  the antibiotic and then subsequently things will revert back to having issues  with blue-green drainage, increased pain, and overall worsening in general. Obviously that is the reason I sent him back to infectious disease. 07/19/2019 upon evaluation today patient appears to be doing roughly the same in size there is really no dramatic improvement. He has started back on the Levaquin at this point and though he seems to be doing okay he did still have a lot of blue/green drainage noted on evaluation today unfortunately. I think that this is still indicative more likely of a Pseudomonas infection as previously noted and again he does see Dr. Megan Salon in just a couple of days. I do not know that were really able to effectively clear this with just oral antibiotics alone based on what I am seeing currently. Nonetheless we are still continue to try to manage as best we can with regard to the patient and his wound. I do think the wrap was helpful in decreasing the edema which is excellent news. No fevers, chills, nausea, vomiting, or diarrhea. 07/26/2019 upon evaluation today patient appears to be doing slightly better with regard to the overall appearance of the muscle there is no dark discoloration centrally. Fortunately there is no signs of active infection at this time. No fevers, chills, nausea, vomiting, or diarrhea. Patient's wound bed currently the patient did have an appointment with Dr. Megan Salon at infectious disease last week. With that being said Dr. Megan Salon the patient states was still somewhat hesitant about put him on any IV antibiotics he wanted [place] to repeat cultures today and then see where things go going forward. He does look like Dr. Megan Salon because of some improvement the patient did have with the Levaquin wanted [place] to see about repeating cultures. If it indeed grows the Pseudomonas again then he recommended a possibility of considering a PICC line placement and IV antibiotic therapy. He  plans to see the patient back in 1 to 2 weeks. 08/02/2019 upon evaluation today patient appears to be doing poorly with regard to his left lower extremity. We did get the results of his culture back it shows that he is still showing evidence of Pseudomonas which is consistent with the purulent/blue-green drainage that he has currently. Subsequently the culture also shows that he now is showing resistance to the oral fluoroquinolones which is unfortunate as that was really the only thing to treat the infection prior. I do believe that he is looking like this is going require IV antibiotic therapy to get this under control. Fortunately there is no signs of systemic infection at this time which is good news. The patient does see Dr. Megan Salon tomorrow. 08/09/2019 upon evaluation today patient appears to be doing better with regard to his left lower extremity ulcer in regard to the overall appearance. He is currently on IV antibiotic therapy. As ordered by Dr. Megan Salon. Currently the patient is on ceftazidime which she is going to take for the next 2 weeks and then follow-up for 4 to 5-week appointment with Dr. Megan Salon. The patient started this this past Friday symptoms have not for a total of 3 days currently in full. 08/16/2019 upon evaluation today patient's wound actually does show muscle in the base of the wound but in general does appear to be much better as far as the overall evidence of infection is concerned. In fact I feel like this is for the most part cleared up he still on the IV antibiotics he has not completed the full course yet but I think he is doing much better which is  excellent news. 08/23/2019 upon evaluation today patient appears to be doing about the same with regard to his wound at this point. He tells me that he still has pain unfortunately. Fortunately there is no evidence of systemic infection at this time which is great news. There is significant muscle protrusion. 09/13/19 upon  evaluation today patient appears to be doing about the same in regard to his leg unfortunately. He still has a lot of drainage coming from the ulceration there is still muscle exposed. With that being said the patient's last wound culture still showed an intermediate finding with regard to the Pseudomonas he still having the bluish/green drainage as well. Overall I do not know that the wound has completely cleared of infection at this point. Fortunately there is no signs of active infection systemically at this point which is good news. 09/20/2019 upon evaluation today patient's wound actually appears to be doing about the same based on what I am seeing currently. I do not see any signs of systemic infection he still does have evidence of some local infection and drainage. He did see Dr. Megan Salon last week and Dr. Megan Salon states that he probably does need a different IV antibiotic although he does not want to put him on this until the patient begins the Remicade infusion which is actually scheduled for about 10 days out from today on 13 May. Following that time Dr. Megan Salon is good to see him back and then will evaluate the feasibility of starting him on the IV antibiotic therapy once again at that point. I do not disagree with this plan I do believe as Dr. Megan Salon stated in his note that I reviewed today that the patient's issue is multifactorial with the pyoderma being 1 aspect of this that were hoping the Remicade will be helpful for her. In the meantime I think the gentamicin is, helping to keep things under decent okay control in regard to the ulcer. 09/27/2019 upon evaluation today patient appears to be doing about the same with regard to his wound still there is a lot of muscle exposure though he does have some hyper granulation tissue noted around the edge and actually some granulation tissue starting to form over the muscle which is actually good news. Fortunately there is no evidence of active  infection which is also good news. His pain is less at this point. 5/21; this is a patient I have not seen in a long time. 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 He has a large wound on the left lateral leg with protruding muscle. He comes in the clinic today showing the same area on his left medial ankle. He says there is been a spot there for some time although we have not previously defined this. Today he has a clearly defined area with slight amount of skin breakdown surrounded by raised areas with a purplish hue in color. This is not painful he says it is irritated. This looks distinctly like I might imagine pyoderma starting Electronic Signature(s) Signed: 10/08/2019 12:54:43 PM By: Linton Ham MD Entered By: Linton Ham on 10/08/2019 08:31:12 GARNETT, NUNZIATA ([id number]) ROHAIL, KLEES ([id number]) -------------------------------------------------------------------------------- Physical Exam Details Patient Name: [name] Date of Service: 10/08/2019 8:00 AM Medical Record Number: [id number] Patient Account Number: [id number] Date of Birth/Sex: 03/23/1981 (39 y.o. M) Treating RN: Montey Hora Primary Care Provider: [name] Other Clinician: Referring Provider: [name] Treating Provider/Extender: Tito Dine in Treatment: 148 Respiratory Respiratory effort is easy and  symmetric bilaterally. Rate is normal at rest and on room air.. Cardiovascular Pedal pulses are palpable on the left. Edema is under good control. Integumentary (Hair, Skin) Really worrisome area small area on the left medial ankle. Irritated slightly purple in color. In the center of this mild area of the epithelialization. This is nontender.. Notes Wound exam; the patient's major area on the left lateral calf larger than I remember with exposed muscle. We have been using topical gentamicin to this area with a border foam. We took him out of  compression because of irritation and to allow him to change the dressing. oThe new areas on the left medial ankle see discussion above under skin Electronic Signature(s) Signed: 10/08/2019 12:54:43 PM By: Linton Ham MD Entered By: Linton Ham on 10/08/2019 08:32:48 [name] ([id number]) -------------------------------------------------------------------------------- Physician Orders Details Patient Name: [name] Date of Service: 10/08/2019 8:00 AM Medical Record Number: [id number] Patient Account Number: [id number] Date of Birth/Sex: [birth year] (39 y.o. M) Treating RN: Montey Hora Primary Care Provider: [name] Other Clinician: Referring Provider: [name] Treating Provider/Extender: Tito Dine in Treatment: 12 Verbal / Phone Orders: No Diagnosis Coding Wound Cleansing Wound #1 Left,Lateral Lower Leg o Clean wound with Normal Saline. Wound #3 Left,Medial Ankle o Clean wound with Normal Saline. Anesthetic (add to Medication List) Wound #1 Left,Lateral Lower Leg o Topical Lidocaine 4% cream applied to wound bed prior to debridement (In Clinic Only). Primary Wound Dressing o Gentamicin Sulfate Cream Wound #3 Left,Medial Ankle o Other: - clobetasol (TCA in clinic) Secondary Dressing Wound #1 Left,Lateral Lower Leg o Boardered Foam Dressing Wound #3 Left,Medial Ankle o Boardered Foam Dressing Dressing Change Frequency Wound #1 Left,Lateral Lower Leg o Change dressing every day. Wound #3 Left,Medial Ankle o Change dressing every day. Follow-up Appointments Wound #1 Left,Lateral Lower Leg o Return Appointment in 1 week. Wound #3 Left,Medial Ankle o Return Appointment in 1 week. Edema Control Wound #1 Left,Lateral Lower Leg o Patient to wear own compression stockings o Elevate legs to the level of the heart and pump ankles as often as possible Wound #3 Left,Medial Ankle o Patient to wear own  compression stockings o Elevate legs to the level of the heart and pump ankles as often as possible Patient Medications Allergies: milk, Biaxin, seasonal Notifications Medication Indication Start End clobetasol Pyoderma 10/08/2019 DOSE topical 0.05 % cream - cream topical lightly to affected area daily MARISOL, GLAZER ([id number]) Electronic Signature(s) Signed: 10/08/2019 8:35:10 AM By: Linton Ham MD Entered By: Linton Ham on 10/08/2019 08:35:09 [name] ([id number]) -------------------------------------------------------------------------------- Problem List Details Patient Name: [name] Date of Service: 10/08/2019 8:00 AM Medical Record Number: [id number] Patient Account Number: [id number] Date of Birth/Sex: 1981-04-05 (39 y.o. M) Treating RN: Montey Hora Primary Care Provider: [name] Other Clinician: Referring Provider: [name] Treating Provider/Extender: Tito Dine in Treatment: 148 Active Problems ICD-10 Encounter Code Description Active Date MDM Diagnosis L97.222 Non-pressure chronic ulcer of left calf with fat layer exposed 12/04/2016 No Yes L88 Pyoderma gangrenosum 03/26/2017 No Yes L97.321 Non-pressure chronic ulcer of left ankle limited to breakdown of skin 10/08/2019 No Yes I87.2 Venous insufficiency (chronic) (peripheral) 12/04/2016 No Yes L03.116 Cellulitis of left lower limb 05/24/2019 No Yes Inactive Problems ICD-10 Code Description Active Date Inactive Date L97.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 M) Treating RN: Montey Hora Primary Care Provider: [name] Other Clinician: Referring Provider: [name] Treating Provider/Extender: Tito Dine in Treatment: 148 Subjective History of Present Illness (HPI) 12/04/16; 39 year old man who comes into the clinic today for review of a wound on the posterior left calf. He tells me that is been there for about a year. He is not a diabetic he does smoke half a pack per day. He was seen in the ER on 11/20/16 felt to have cellulitis around the wound and was given clindamycin. An x-ray did not show osteomyelitis. The patient initially tells me that he has a milk allergy that sets off a pruritic itching rash on his lower legs which she scratches incessantly and he thinks that's what may have set up the wound. He has been using various topical antibiotics and ointments without any effect. He works in a [employer] and is on his feet all day. He does not have a prior history of wounds however he does have the rash on both lower legs the right arm and the ventral aspect of his left arm. These are excoriations and clearly have had scratching however there are of macular looking areas on both legs including a substantial larger area on the right leg. This does not have an underlying open area. There is no blistering. The patient tells me that 2 years ago in [place] in response to the rash on his legs he saw a dermatologist who told him he had a condition which may be pyoderma gangrenosum although I may be putting words into his mouth. He seemed to recognize this. 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 He is not in any treatment for this. He's had no recent travel 12/11/16; the patient arrives today with his wound and roughly the same condition we've been using silver alginate this is a deep punched out wound  with some surrounding erythema but no tenderness. Biopsy I did did not show confirmed pyoderma gangrenosum suggested nonspecific inflammation and vasculitis but does not provide an actual description of what was seen by the pathologist. I'm really not able to understand this We have also received information from the patient's dermatologist in [place] notes from April 2016. This was a doctor Agarwal-antal. 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 He was prescribed topical steroid high potency under occlusion which helped but at this point the patient did not have a deep punched out wound. 12/18/16; the patient's wound is larger in terms of surface area however this surface looks better and there is less depth. The surrounding erythema also is better. The patient states that the wrap we put on came off 2 days ago when he has been using his compression stockings. He we are in the process of getting a dermatology consult. 12/26/16 on evaluation today patient's left lower extremity wound shows evidence of infection with surrounding erythema noted. He has been tolerating the dressing changes but states that he has noted more discomfort. There is a larger area of erythema surrounding the wound. No fevers, chills, nausea, or vomiting noted at this time. With that being said the wound still does have slough covering the surface. He is not allergic to any medication that he is aware of at this point. In regard to his right lower extremity he had  several regions that are erythematous and pruritic he wonders if there's anything we can do to help that. 01/02/17 I reviewed patient's wound culture which was obtained his visit last week. He was placed on doxycycline at that point. Unfortunately that does not appear to be an antibiotic that would likely help with the situation however the pseudomonas noted on culture is sensitive to Cipro. Also unfortunately patient's wound seems to have a large compared to  last week's evaluation. Not severely so but there are definitely increased measurements in general. He is continuing to have discomfort as well he writes this to be a seven out of 10. In fact he would prefer me not to perform any debridement today due to the fact that he is having discomfort and considering he has an active infection on the little reluctant to do so anyway. No fevers, chills, nausea, or vomiting noted at this time. 01/08/17; patient seems dermatology on September 5. I suspect dermatology will want the slides from the biopsy I did sent to their pathologist. I'm not sure if there is a way we can expedite that. In any case the culture I did before I left on vacation 3 weeks ago showed Pseudomonas he was given 10 days of Cipro and per her description of her intake nurses is actually somewhat better this week although the wound is quite a bit bigger than I remember the last time I saw this. He still has 3 more days of Cipro 01/21/17; dermatology appointment tomorrow. He has completed the ciprofloxacin for Pseudomonas. Surface of the wound looks better however he is had some deterioration in the lesions on his right leg. Meantime the left lateral leg wound we will continue with sample 01/29/17; patient had his dermatology appointment but I can't yet see that note. He is completed his antibiotics. The wound is more superficial but considerably larger in circumferential area than when he came in. This is in his left lateral calf. He also has swollen erythematous areas with superficial wounds on the right leg and small papular areas on both arms. There apparently areas in her his upper thighs and buttocks I did not look at those. Dermatology biopsied the right leg. Hopefully will have their input next week. 02/05/17; patient went back to see his dermatologist who told him that he had a "scratching problem" as well as staph. He is now on a 30 day course of doxycycline and I believe she gave him  triamcinolone cream to the right leg areas to help with the itching [not exactly sure but probably triamcinolone]. She apparently looked at the left lateral leg wound although this was not rebiopsied and I think felt to be ultimately part of the same pathogenesis. He is using sample border foam and changing nevus himself. He now has a new open area on the right posterior leg which was his biopsy site I don't have any of the dermatology notes 02/12/17; we put the patient in compression last week with SANTYL to the wound on the left leg and the biopsy. Edema is much better and the depth of the wound is now at level of skin. Area is still the same Biopsy site on the right lateral leg we've also been using santyl with a border foam dressing and he is changing this himself. 02/19/17; Using silver alginate started last week to both the substantial left leg wound and the biopsy site on the right wound. He is tolerating compression well. Has a an appointment with his primary M.D.  tomorrow wondering about diuretics although I'm wondering if the edema problem is actually lymphedema 02/26/17; the patient has been to see his primary doctor Dr. Jerrel Ivory at [facility] our primary care. She started him on Lasix 20 mg and this seems to have helped with the edema. However we are not making substantial change with the left lateral calf wound and inflammation. The biopsy site on the right leg also looks stable but not really all that different. 03/12/17; the patient has been to see vein and vascular Dr. Lucky Cowboy. He has had venous reflux studies I have not reviewed these. I did get a call from his dermatology office. They felt that he might have pathergy based on their biopsy on his right leg which led them to look at the slides of ZEVEN, KOCAK ([id number]) the biopsy I did on the left leg and they wonder whether this represents pyoderma gangrenosum which was the original supposition in a man with ulcerative colitis  albeit inactive for many years. They therefore recommended clobetasol and tetracycline i.e. 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 03/26/17; apparently the patient just had reflux studies not an appointment with Dr. dew. She arrives in clinic today having applied clobetasol for 2-3 weeks. He notes over the last 2-3 days excessive drainage having to change the dressing 3-4 times a day and also expanding erythema. 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 Lillia Carmel at [facility] dermatology locally who kindly saw him at my request. 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 My concern was the degree of erythema medially extending from his left leg wound which was either inflammation from pyoderma or cellulitis. I put him on Augmentin however culture of the wound showed Pseudomonas which is quinolone sensitive. I really don't believe he has cellulitis however in view of everything I will continue and give him a course of Cipro. He is also on doxycycline as an immune modulator for the pyoderma. In addition to his original wound on the left lateral leg with surrounding erythema he has a wound on the right posterior calf which was an original biopsy site done by dermatology. 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 Saw Dr. Lillia Carmel yesterday. He has been using topical antibiotics to both wound areas his original wound on the left and the biopsies/pathergy area on the right. There is definitely some improvement in the inflammation around the wound on the right although the patient states he has increasing sensitivity of the  wounds. He is on prednisone 60 and doxycycline 1 as prescribed by Dr. Lillia Carmel. He is covering the topical antibiotic with gauze and putting this in his own compression stocks and changing this daily. He states that Dr. Lottie Rater did a culture of the left leg wound yesterday 05/07/17; pyoderma gangrenosum. The patient saw Dr. Lillia Carmel yesterday and has a follow-up with her in one month. He is still using topical antibiotics to both wounds although he can't recall exactly what type. He is still on prednisone 60 mg. Dr. Lillia Carmel stated that the doxycycline could stop if we were in agreement. 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 The right medial leg was induced by biopsy/pathergy. The area on the right is essentially healed. 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 The right medial leg has closed and remains closed. He is still on prednisone 60. He tells me he missed his last dermatology appointment with Dr. Lillia Carmel but will make another appointment. He reports that her blood sugar at a recent screen in [place] was high 200's. He was 180 today. 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 I think he is going to require still much longer prednisone perhaps another 3 months before attempting to taper. In the meantime his wound is a lot better. Smaller. He is cleaning this off daily and applying topical antibiotics. When he was last in the clinic I thought about changing to [facility] and actually put in a couple of calls to dermatology although probably not during their business hours. 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 He continues on oral prednisone. He  has been using triple antibiotic ointment. There is surface debris and we will transition to [facility] and have him return in 2 weeks. He has lost 30 pounds since his last appointment with lifestyle modification. He may benefit from topical steroid cream for treatment this can be considered at a later date. 08/22/17 on evaluation today patient appears to actually be doing rather well in regard to his left lateral lower extremity ulcer. He has actually been managed by Dr. Dellia Nims most recently. Patient is currently on oral steroids at this time. This seems to have been of benefit for him. Nonetheless his last visit was actually with Leah on 08/06/17. Currently he is not utilizing any topical steroid creams although this could be of benefit as well. No fevers, chills, nausea, or vomiting noted at this time. 09/05/17 on evaluation today patient appears to be doing better in regard to his left lateral lower extremity ulcer. He has been tolerating the dressing changes without complication. He is using Santyl with good effect. Overall I'm very pleased with how things are standing at this point. Patient likewise is happy that this is doing better. 09/19/17 on evaluation today patient actually appears to be doing rather well in regard to his left lateral lower extremity ulcer. 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 He's not having any significant pain. 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 He has been tolerating the Santyl without complication and in general I feel like he's making good progress. 10/17/17 on evaluation today patient appears to be doing very well in regard to his left lateral lower surety ulcer. He has been tolerating the dressing changes without complication. There does not appear to be any evidence of  infection he's alternating the Santyl and the triple antibiotic ointment every other day this seems to be doing well for him. 11/03/17 on evaluation today patient appears to be doing very well in regard to his left lateral lower extremity ulcer. He is been tolerating the dressing changes without complication which is good news. Fortunately there does not appear to be any evidence of infection which is  also great news. Overall is doing excellent they are starting to taper down on the prednisone is down to 40 mg at this point it also started topical clobetasol for him. 11/17/17 on evaluation today patient appears to be doing well in regard to his left lateral lower surety ulcer. He's been tolerating the dressing changes without complication. He does note that he is having no pain, no excessive drainage or discharge, and overall he feels like things are going about how he would expect and hope they would. Overall he seems to have no evidence of infection at this time in my opinion which is good news. 12/04/17-He is seen in follow-up evaluation for right lateral lower extremity ulcer. He has been applying topical steroid cream. Today's measurement show slight increase in size. Over the next 2 weeks we will transition to every other day Santyl and steroid cream. He has been encouraged to monitor for changes and notify clinic with any concerns 12/15/17 on evaluation today patient's left lateral motion the ulcer and fortunately is doing worse again at this point. This just since last week to this week has close to doubled in size according to the patient. I did not seeing last week's I do not have a visual to compare this to in our system was also down so we do not have all the charts and at this point. Nonetheless it does have me somewhat concerned in regard to the fact that again he was worried enough about it he has contact the dermatology that placed them back on the full strength, 50 mg a day of the  prednisone that he was taken previous. He continues to alternate using clobetasol along with Santyl at this point. He is obviously somewhat frustrated. 12/22/17 on evaluation today patient appears to be doing a little worse compared to last evaluation. Unfortunately the wound is a little deeper and slightly larger than the last week's evaluation. With that being said he has made some progress in regard to the irritation surrounding at this time unfortunately despite that progress that's been made he still has a significant issue going on here. 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 SAMUL, MCINROY ([id number]) For that reason I discussed with him today the possibility of perform a wound culture to ensure there's nothing overtly infected. 01/06/18 on evaluation today patient's wound is larger and deeper than previously evaluated. With that being said it did appear that his wound was infected after my last evaluation with him. Subsequently I did end up prescribing a prescription for Bactrim DS which she has been taking and having no complication with. Fortunately there does not appear to be any evidence of infection at this point in time as far as anything spreading, no want to touch, and overall I feel like things are showing signs of improvement. 01/13/18 on evaluation today patient appears to be even a little larger and deeper than last time. There still muscle exposed in the base of the wound. Nonetheless he does appear to be less erythematous I do believe inflammation is calming down also believe the infection looks like it's probably resolved at this time based on what I'm seeing. No fevers, chills, nausea, or vomiting noted at this time. 01/30/18 on evaluation today patient actually appears to visually look better for the most part. Unfortunately  those visually this looks better he does seem to  potentially have what may be an abscess in the muscle that has been noted in the central portion of the wound. This is the first time that I have noted what appears to be fluctuance in the central portion of the muscle. With that being said I'm somewhat more concerned about the fact that this might indicate an abscess formation at this location. I do believe that an ultrasound would be appropriate. This is likely something we need to try to do as soon as possible. He has been switch to mupirocin ointment and he is no longer using the steroid ointment as prescribed by dermatology he sees them again next week he's been decreased from 60 to 40 mg of prednisone. 03/09/18 on evaluation today patient actually appears to be doing a little better compared to last time I saw him. There's not as much erythema surrounding the wound itself. He I did review his most recent infectious disease note which was dated 02/24/18. He saw Dr. Michel Bickers in [place]. 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 Patient is now off of antibiotics and they are continually observing this. There seems to be no change in the past two weeks in my pinion based on what the patient says and what I see today compared to what Dr. Megan Salon likely saw two weeks ago. No fevers, chills, nausea, or vomiting noted at this time. 03/23/18 on evaluation today patient's wound actually appears to be showing signs of improvement which is good news. He is currently still on the Dapsone. He is also working on tapering the prednisone to get off of this and Dr. Lottie Rater is working with him in this regard. Nonetheless overall I feel like the wound is doing well it does appear based on the infectious disease note that I reviewed from Dr. 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 I did also review the results of his ultrasound of left lower extremity which revealed there was a dentist tissue in the base of the wound without an abscess noted. 04/06/18 on evaluation today the patient's left lateral lower extremity ulcer actually appears to be doing fairly well which is excellent news. There does not appear to be any evidence of infection at this time which is also great news. Overall he still does have a significantly large ulceration although little by little he seems to be making progress. He is down to 10 mg a day of the prednisone. 04/20/18 on evaluation today patient actually appears to be doing excellent at this time in regard to his left lower extremity ulcer. He's making signs of good progress unfortunately this is taking much longer than we would really like to see but nonetheless he is making progress. Fortunately there does not appear to be any evidence of infection at this time. No fevers, chills, nausea, or vomiting noted at this time. The patient has not been using the Santyl due to the cost he hadn't got in this field yet. He's mainly been using the antibiotic ointment topically. Subsequently he also tells me that he really has not been scrubbing in the shower I think this would be helpful again as I told him it doesn't have to be anything too aggressive to even make it believe just enough to keep it free of some  of the loose slough and biofilm on the wound surface. 05/11/18 on evaluation today patient's wound appears to be making slow but sure progress in regard to the left lateral lower extremity ulcer. He is been tolerating the dressing changes without complication. Fortunately there does not appear to be any evidence of infection at this time. He is still just using triple antibiotic ointment along with clobetasol occasionally over the area. He never got the Santyl and really does not  seem to intend to in my pinion. 06/01/18 on evaluation today patient appears to be doing a little better in regard to his left lateral lower extremity ulcer. He states that overall he does not feel like he is doing as well with the Dapsone as he did with the prednisone. Nonetheless he sees his dermatologist later today and is gonna talk to them about the possibility of going back on the prednisone. Overall again I believe that the wound would be better if you would utilize Santyl but he really does not seem to be interested in going back to the [place] at this point. He has been using triple antibiotic ointment. 06/15/18 on evaluation today patient's wound actually appears to be doing about the same at this point. Fortunately there is no signs of infection at this time. He has made slight improvements although he continues to not really want to clean the wound bed at this point. He states that he just doesn't mess with it he doesn't want to cause any problems with everything else he has going on. He has been on medication, antibiotics as prescribed by his dermatologist, for a staff infection of his lower extremities which is really drying out now and looking much better he tells me. Fortunately there is no sign of overall infection. 06/29/18 on evaluation today patient appears to be doing well in regard to his left lateral lower surety ulcer all things considering. Fortunately his staff infection seems to be greatly improved compared to previous. He has no signs of infection and this is drying up quite nicely. He is still the doxycycline for this is no longer on cental, Dapsone, or any of the other medications. His dermatologist has recommended possibility of an infusion but right now he does not want to proceed with that. 07/13/18 on evaluation today patient appears to be doing about the same in regard to his left lateral lower surety ulcer. Fortunately there's no signs of infection at this time which is  great news. Unfortunately he still builds up a significant amount of Slough/biofilm of the surface of the wound he still is not really cleaning this as he should be appropriately. Again I'm able to easily with saline and gauze remove the majority of this on the surface which if you would do this at home would likely be a dramatic improvement for him as far as getting the area to improve. Nonetheless overall I still feel like he is making progress is just very slow. I think Santyl will be of benefit for him as well. Still he has not gotten this as of this point. 07/27/18 on evaluation today patient actually appears to be doing little worse in regards of the erythema around the periwound region of the wound he also tells me that he's been having more drainage currently compared to what he was experiencing last time I saw him. He states not quite as bad as what he had because this was infected previously but nonetheless is still appears to be doing poorly. Fortunately there  is no evidence of systemic infection at this point. The patient tells me that he is not going to be able to afford the Santyl. He is still waiting to hear about the infusion therapy with his dermatologist. Apparently she wants an updated colonoscopy first. 08/10/18 on evaluation today patient appears to be doing better in regard to his left lateral lower extremity ulcer. Fortunately he is showing signs of improvement in this regard he's actually been approved for Remicade infusion's as well although this has not been scheduled as of yet. Fortunately there's no signs of active infection at this time in regard to the wound although he is having some issues with infection of the right lower extremity is been seen as dermatologist for this. Fortunately they are definitely still working with him trying to keep things under control. GILES, CURRIE ([id number]) 09/07/18 on evaluation today patient is actually doing rather well in regard to his  left lateral lower extremity ulcer. He notes these actually having some hair grow back on his extremity which is something he has not seen in years. He also tells me that the pain is really not giving them any trouble at this time which is also good news overall she is very pleased with the progress he's using a combination of the mupirocin along with the probate is all mixed. 09/21/18 on evaluation today patient actually appears to be doing fairly well all things considered in regard to his looks from the ulcer. He's been tolerating the dressing changes without complication. Fortunately there's no signs of active infection at this time which is good news he is still on all antibiotics or prevention of the staff infection. He has been on prednisone for time although he states it is gonna contact his dermatologist and see if she put them on a short course due to some irritation that he has going on currently. Fortunately there's no evidence of any overall worsening this is going very slow I think cental would be something that would be helpful for him although he states that $50 for tube is quite expensive. He therefore is not willing to get that at this point. 10/06/18 on evaluation today patient actually appears to be doing decently well in regard to his left lateral leg ulcer. He's been tolerating the dressing changes without complication. Fortunately there's no signs of active infection at this time. Overall I'm actually rather pleased with the progress he's making although it's slow he doesn't show any signs of infection and he does seem to be making some improvement. I do believe that he may need a switch up and dressings to try to help this to heal more appropriately and quickly. 10/19/18 on evaluation today patient actually appears to be doing better in regard to his left lateral lower extremity ulcer. This is shown signs of having much less Slough buildup at this point due to the fact he has been  using the [employer]. Obviously this is very good news. The overall size of the wound is not dramatically smaller but again the appearance is. 11/02/18 on evaluation today patient actually appears to be doing quite well in regard to his lower [place] ulcer. A lot of the skin around the ulcer is actually somewhat irritating at this point this seems to be more due to the dressing causing irritation from the adhesive that anything else. Fortunately there is no signs of active infection at this time. 11/24/18 on evaluation today patient appears to be doing a little worse in regard to  his overall appearance of his lower extremity ulcer. There's more erythema and warmth around the wound unfortunately. He is currently on doxycycline which he has been on for some time. With that being said I'm not sure that seems to be helping with what appears to possibly be an acute cellulitis with regard to his left lower extremity ulcer. No fevers, chills, nausea, or vomiting noted at this time. 12/08/18 on evaluation today patient's wounds actually appears to be doing significantly better compared to his last evaluation. He has been using Santyl along with alternating tripling about appointment as well as the steroid cream seems to be doing quite well and the wound is showing signs of improvement which is excellent news. Fortunately there's no evidence of infection and in fact his culture came back negative with only normal skin flora noted. 12/21/2018 upon evaluation today patient actually appears to be doing excellent with regard to his ulcer. This is actually the best that I have seen it since have been helping to take care of him. It is both smaller as well as less slough noted on the surface of the wound and seems to be showing signs of good improvement with new skin growing from the edges. He has been using just the triamcinolone he does wonder if he can get a refill of that ointment today. 01/04/2019 upon evaluation today  patient actually appears to be doing well with regard to his left lateral lower extremity ulcer. With that being said it does not appear to be that he is doing quite as well as last time as far as progression is concerned. There does not appear to be any signs of infection or significant irritation which is good news. With that being said I do believe that he may benefit from switching to a collagen based dressing based on how clean The wound appears. 01/18/2019 on evaluation today patient actually appears to be doing well with regard to his wound on the left lower extremity. He is not made a lot of progress compared to where we were previous but nonetheless does seem to be doing okay at this time which is good news. There is no signs of active infection which is also good news. My only concern currently is I do wish we can get him into utilizing the collagen dressing his insurance would not pay for the supplies that we ordered although it appears that he may be able to order this through his supply company that he typically utilizes. This is [employer]. Nonetheless he did try to order it during the office visit today and it appears this did go through. We will see if he can get that it is a different brand but nonetheless he has collagen and I do think will be beneficial. 02/01/2019 on evaluation today patient actually appears to be doing a little worse today in regard to the overall size of his wounds. Fortunately there is no signs of active infection at this time. That is visually. Nonetheless when this is happened before it was due to infection. For that reason were somewhat concerned about that this time as well. 02/08/2019 on evaluation today patient unfortunately appears to be doing slightly worse with regard to his wound upon evaluation today. Is measuring a little deeper and a little larger unfortunately. I am not really sure exactly what is causing this to enlarge he actually did see  his dermatologist she is going to see about initiating Humira for him. Subsequently she also did do steroid injections  into the wound itself in the periphery. Nonetheless still nonetheless he seems to be getting a little bit larger he is gone back to just using the steroid cream topically which I think is appropriate. I would say hold off on the collagen for the time being is definitely a good thing to do. Based on the culture results which we finally did get the final result back regarding it shows staph as the bacteria noted again that can be a normal skin bacteria based on the fact however he is having increased drainage and worsening of the wound measurement wise I would go ahead and place him on an antibiotic today I do believe for this. 02/15/2019 on evaluation today patient actually appears to be doing somewhat better in regard to his ulcer. There is no signs of worsening at this time I did review his culture results which showed evidence of Staphylococcus aureus but not MRSA. Again this could just be more related to the normal skin bacteria although he states the drainage has slowed down quite a bit he may have had a mild infection not just colonization. And was much smaller and then since around10/04/2019 on evaluation today patient appears to be doing unfortunately worse as far as the size of the wound. I really feel like that this is steadily getting larger again it had been doing excellent right at the beginning of September we have seen a steady increase in the area of the wound it is almost 2-1/2 times the size it was on September 1. Obviously this is a bad trend this is not wanting to see. For that reason we went back to using just the topical triamcinolone cream which does seem to help with inflammation. I checked him for bacteria by way of culture and nothing showed positive there. I am considering giving him a short course of a tapering steroid Dosepak today to see if that is can be  beneficial for him. The patient is in agreement with giving that a try. 03/08/2019 on evaluation today patient appears to be doing very well in comparison to last evaluation with regard to his lower extremity ulcer. This is showing signs of less inflammation and actually measuring slightly smaller compared to last time every other week over the past month and a half he has been measuring larger larger larger. Nonetheless I do believe that the issue has been inflammation the prednisone does seem to [facility] [place] ([id number]) have been beneficial for him which is good news. No fevers, chills, nausea, vomiting, or diarrhea. 03/22/2019 on evaluation today patient appears to be doing about the same with regard to his leg ulcer. He has been tolerating the dressing changes without complication. With that being said the wound seems to be mostly arrested at its current size but really is not making any progress except for when we prescribed the prednisone. He did show some signs of dropping as far as the overall size of the wound during that interval week. Nonetheless this is something he is not on long-term at this point and unfortunately I think he is getting need either this or else the Humira which his dermatologist has discussed try to get approval for. With that being said he will be seeing his dermatologist on the 11th of this month that is November. 04/19/2019 on evaluation today patient appears to be doing really about the same the wound is measuring slightly larger compared to last time I saw him. He has not been into the office since November  2 due to the fact that he unfortunately had Covid as that his entire family. He tells me that it was rough but they did pull-through and he seems to be doing much better. Fortunately there is no signs of active infection at this time. No fevers, chills, nausea, vomiting, or diarrhea. 05/10/2019 on evaluation today patient unfortunately appears to be doing  significantly worse as compared to last time I saw him. He does tell me that he has had his first dose of Humira and actually is scheduled to get the next one in the upcoming week. With that being said he tells me also that in the past several days he has been having a lot of issues with green drainage she showed me a picture this is more blue-green in color. He is also been having issues with increased sloughy buildup and the wound does appear to be larger today. Obviously this is not the direction that we want everything to take based on the starting of his Humira. Nonetheless I think this is definitely a result of likely infection and to be honest I think this is probably Pseudomonas causing the infection based on what I am seeing. 05/24/2019 on evaluation today patient unfortunately appears to be doing significantly worse compared to his prior evaluation with me 2 weeks ago. I did review his culture results which showed that he does have Staph aureus as well as Pseudomonas noted on the culture. Nonetheless the Levaquin that I prescribed for him does not appear to have been appropriate and in fact he tells me he is no longer experiencing the green drainage and discharge that he had at the last visit. Fortunately there is no signs of active infection at this time which is good news although the wound has significantly worsened it in fact is much deeper than it was previous. We have been utilizing up to this point triamcinolone ointment as the prescription topical of choice but at this time I really feel like that the wound is getting need to be packed in order to appropriately manage this due to the deeper nature of the wound. Therefore something along the lines of an alginate dressing may be more appropriate. 05/31/2019 upon inspection today patient's wound actually showed signs of doing poorly at this point. Unfortunately he just does not seem to be making any good progress despite what we have tried.  He actually did go ahead and pick up the Cipro and start taking that as he was noticing more green drainage he had previously completed the Levaquin that I prescribed for him as well. Nonetheless he missed his appointment for the seventh last week on Wednesday with the wound care center and [facility] where his dermatologist referred him. Obviously I do think a second opinion would be helpful at this point especially in light of the fact that the patient seems to be doing so poorly despite the fact that we have tried everything that I really know how at this point. The only thing that ever seems to have helped him in the past is when he was on high doses of continual steroids that did seem to make a difference for him. 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 06/07/2019 upon evaluation today patient unfortunately appears to be doing worse yet again with regard  to his wound. In fact I am starting to question whether or not he may have a fluid pocket in the muscle at this point based on the bulging and the soft appearance to the central portion of the muscle area. There is not anything draining from the muscle itself at this time which is good news but nonetheless the wound is expanding. I am not really seeing any results of the Humira as far as overall wound progression based on what I am seeing at this point. The patient has been referred for second opinion with regard to his wound to the [facility] wound care center by his dermatologist which I definitely am not in opposition to. Unfortunately we tried multiple dressings in the past including collagen, alginate, and at one point even Hydrofera Blue. With that being said he is never really used it for any significant amount of time due to the fact that he often complains of pain associated with  these dressings and then will go back to either using the Santyl which she has done intermittently or more frequently the triamcinolone. He is also using his own compression stockings. We have wrapped him in the past but again that was something else that he really was not a big fan of. 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 He seemed to do much better when he was on the steroids although honestly I understand that the steroids are not necessarily the best medication to be on long-term obviously 06/14/2019 on evaluation today patient appears to be doing actually a little bit better with regard to the overall appearance with his leg. Unfortunately he does continue to have issues with what appears to be some fluid underneath the muscle although he did see the wound specialty center at [facility] last week their main goals were to see about infusion therapy in place of the Humira as they feel like that is not quite strong enough. They also recommended that we continue with the treatment otherwise as we are they felt like that was appropriate and they are okay with him continuing to follow-up here with [place] in that regard. With that being said they are also sending him to the vein specialist there to see about vein stripping and if that would be of benefit for him. Subsequently they also did not really address whether or not an ultrasound of the muscle area to see if there is anything that needs to be addressed here would be appropriate or not. For that reason I discussed this with him last week I think we may proceed down that road at this point. 06/21/2019 upon evaluation today patient's  wound actually appears to be doing slightly better compared to previous evaluations. I do believe that he has made a difference with regard to the progression here with the use of oral steroids. Again in the past has been the only thing that is really calm things down. He does tell me that from [facility] is gotten a good news from there that there are no further vein stripping that is necessary at this point. I do not have that available for review today although the patient  did relay this to me. He also did obtain and have the ultrasound of the wound completed which I did sign off on today. It does appear that there is no fluid collection under the muscle this is likely then just edematous tissue in general. That is also good news. Overall I still believe the inflammation is the main issue here. He did inquire about the possibility of a wound VAC again with the muscle protruding like it is I am not really sure whether the wound VAC is necessarily ideal or not. That is something we will have to consider although I do believe he may need compression wrapping to try to help with edema control which could potentially be of benefit. 06/28/2019 on evaluation today patient appears to be doing slightly better measurement wise although this is not terribly smaller he least seems to be trending towards that direction. With that being said he still seems to have purulent drainage noted in the wound bed at this time. He has been on Levaquin followed by Cipro over the past month. Unfortunately he still seems to have some issues with active infection at this time. I did perform a culture last week in order to evaluate and see if indeed there was still anything going on. Subsequently the culture did come back showing Pseudomonas which is consistent with the drainage has been having which is blue-green in color. He also has had an odor that again was somewhat consistent with Pseudomonas as well. Long story short it appears  that the culture showed an intermediate finding with regard to how well the Cipro will work for the Pseudomonas infection. Subsequently being that he does not seem to be clearing up and at best what we are doing is just keeping this at [place] I think he may need to see infectious disease to discuss IV antibiotic options. MICO, SPARK ([id number]) 07/05/2019 upon evaluation today patient appears to be doing okay in regard to his leg ulcer. He has been tolerating the dressing changes at this point without complication. Fortunately there is no signs of active infection at this time which is good news. No fevers, chills, nausea, vomiting, or diarrhea. With that being said he does have an appointment with infectious disease tomorrow and his primary care on Wednesday. Again the reason for the infectious disease referral was due to the fact that he did not seem to be fully resolving with the use of oral antibiotics and therefore we were thinking that IV antibiotic therapy may be necessary secondary to the fact that there was an intermediate finding for how effective the Cipro may be. Nonetheless again he has been having a lot of purulent and even green drainage. Fortunately right now that seems to have calmed down over the past week with the reinitiation of the oral antibiotic. Nonetheless we will see what Dr. Megan Salon has to say. 07/12/2019 upon evaluation today patient appears to be doing about the same at this point in regard to his left lower extremity ulcer. Fortunately there is no signs of active infection at this time which is good news I do believe the Levaquin has been beneficial I did review Dr. Hale Bogus note and to be honest I agree that the patient's leg does appear to be doing better currently. What we found in the past as he does not seem to really completely resolve he will stop the antibiotic and then subsequently things will revert back to having issues with blue-green drainage, increased pain,  and overall worsening in  general. Obviously that is the reason I sent him back to infectious disease. 07/19/2019 upon evaluation today patient appears to be doing roughly the same in size there is really no dramatic improvement. He has started back on the Levaquin at this point and though he seems to be doing okay he did still have a lot of blue/green drainage noted on evaluation today unfortunately. I think that this is still indicative more likely of a Pseudomonas infection as previously noted and again he does see Dr. Megan Salon in just a couple of days. I do not know that were really able to effectively clear this with just oral antibiotics alone based on what I am seeing currently. Nonetheless we are still continue to try to manage as best we can with regard to the patient and his wound. I do think the wrap was helpful in decreasing the edema which is excellent news. No fevers, chills, nausea, vomiting, or diarrhea. 07/26/2019 upon evaluation today patient appears to be doing slightly better with regard to the overall appearance of the muscle there is no dark discoloration centrally. Fortunately there is no signs of active infection at this time. No fevers, chills, nausea, vomiting, or diarrhea. Patient's wound bed currently the patient did have an appointment with Dr. Megan Salon at infectious disease last week. With that being said Dr. Megan Salon the patient states was still somewhat hesitant about put him on any IV antibiotics he wanted [place] to repeat cultures today and then see where things go going forward. He does look like Dr. Megan Salon because of some improvement the patient did have with the Levaquin wanted [place] to see about repeating cultures. If it indeed grows the Pseudomonas again then he recommended a possibility of considering a PICC line placement and IV antibiotic therapy. He plans to see the patient back in 1 to 2 weeks. 08/02/2019 upon evaluation today patient appears to be doing poorly with  regard to his left lower extremity. We did get the results of his culture back it shows that he is still showing evidence of Pseudomonas which is consistent with the purulent/blue-green drainage that he has currently. Subsequently the culture also shows that he now is showing resistance to the oral fluoroquinolones which is unfortunate as that was really the only thing to treat the infection prior. I do believe that he is looking like this is going require IV antibiotic therapy to get this under control. Fortunately there is no signs of systemic infection at this time which is good news. The patient does see Dr. Megan Salon tomorrow. 08/09/2019 upon evaluation today patient appears to be doing better with regard to his left lower extremity ulcer in regard to the overall appearance. He is currently on IV antibiotic therapy. As ordered by Dr. Megan Salon. Currently the patient is on ceftazidime which she is going to take for the next 2 weeks and then follow-up for 4 to 5-week appointment with Dr. Megan Salon. The patient started this this past Friday symptoms have not for a total of 3 days currently in full. 08/16/2019 upon evaluation today patient's wound actually does show muscle in the base of the wound but in general does appear to be much better as far as the overall evidence of infection is concerned. In fact I feel like this is for the most part cleared up he still on the IV antibiotics he has not completed the full course yet but I think he is doing much better which is excellent news. 08/23/2019 upon evaluation today patient appears to  be doing about the same with regard to his wound at this point. He tells me that he still has pain unfortunately. Fortunately there is no evidence of systemic infection at this time which is great news. There is significant muscle protrusion. 09/13/19 upon evaluation today patient appears to be doing about the same in regard to his leg unfortunately. He still has a lot of  drainage coming from the ulceration there is still muscle exposed. With that being said the patient's last wound culture still showed an intermediate finding with regard to the Pseudomonas he still having the bluish/green drainage as well. Overall I do not know that the wound has completely cleared of infection at this point. Fortunately there is no signs of active infection systemically at this point which is good news. 09/20/2019 upon evaluation today patient's wound actually appears to be doing about the same based on what I am seeing currently. I do not see any signs of systemic infection he still does have evidence of some local infection and drainage. He did see Dr. Megan Salon last week and Dr. Megan Salon states that he probably does need a different IV antibiotic although he does not want to put him on this until the patient begins the Remicade infusion which is actually scheduled for about 10 days out from today on 13 May. Following that time Dr. Megan Salon is good to see him back and then will evaluate the feasibility of starting him on the IV antibiotic therapy once again at that point. I do not disagree with this plan I do believe as Dr. Megan Salon stated in his note that I reviewed today that the patient's issue is multifactorial with the pyoderma being 1 aspect of this that were hoping the Remicade will be helpful for her. In the meantime I think the gentamicin is, helping to keep things under decent okay control in regard to the ulcer. 09/27/2019 upon evaluation today patient appears to be doing about the same with regard to his wound still there is a lot of muscle exposure though he does have some hyper granulation tissue noted around the edge and actually some granulation tissue starting to form over the muscle which is actually good news. Fortunately there is no evidence of active infection which is also good news. His pain is less at this point. 5/21; this is a patient I have not seen in a long  time. 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 He has a large wound on the left lateral leg with protruding muscle. He comes in the clinic today showing the same area on his left medial ankle. He says there is been a spot there for some time although we have not previously defined this. Today he has a clearly defined area with slight amount of skin breakdown surrounded by raised areas with a purplish hue in color. This is not painful he says it is irritated. This looks distinctly like I might imagine pyoderma starting Objective Moler, Edwin ([id number]) Constitutional Vitals Time Taken: 8:06 AM, Height: 71 in, Weight: 338 lbs, BMI: 47.1, Temperature: 98.4 F, Pulse: 80 bpm, Respiratory Rate: 16 breaths/min, Blood Pressure: 146/84 mmHg. Respiratory Respiratory effort is easy and symmetric bilaterally. Rate is normal at rest and on room air.. Cardiovascular Pedal pulses are palpable on the left. Edema is under good control. General Notes: Wound exam; the patient's major area on the left lateral calf larger than I remember with exposed muscle. We have been using topical gentamicin to this area  with a border foam. We took him out of compression because of irritation and to allow him to change the dressing. The new areas on the left medial ankle see discussion above under skin Integumentary (Hair, Skin) Really worrisome area small area on the left medial ankle. Irritated slightly purple in color. In the center of this mild area of the epithelialization. This is nontender.. Wound #1 status is Open. Original cause of wound was Gradually Appeared. The wound is located on the Left,Lateral Lower Leg. The wound measures 7.6cm length x 7.7cm width x 1cm depth; 45.962cm^2 area and 45.962cm^3 volume. There is muscle and Fat Layer (Subcutaneous Tissue) Exposed exposed. There is a medium amount of purulent drainage noted. The wound margin is epibole. There is large  (67-100%) pink granulation within the wound bed. There is a small (1-33%) amount of necrotic tissue within the wound bed including Adherent Slough. Wound #3 status is Open. Original cause of wound was Gradually Appeared. The wound is located on the Left,Medial Ankle. The wound measures 0.9cm length x 1.5cm width x 0.1cm depth; 1.06cm^2 area and 0.106cm^3 volume. There is Fat Layer (Subcutaneous Tissue) Exposed exposed. There is no tunneling or undermining noted. There is a medium amount of serosanguineous drainage noted. The wound margin is flat and intact. There is small (1-33%) red granulation within the wound bed. There is a large (67-100%) amount of necrotic tissue within the wound bed including Adherent Slough. 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 Wound #3 Left,Medial Ankle: Clean wound with Normal Saline. Anesthetic (add to Medication List): Wound #1 Left,Lateral Lower Leg: Topical Lidocaine 4% cream applied to wound bed prior to debridement (In Clinic Only). Primary Wound Dressing: Gentamicin Sulfate Cream Wound #3 Left,Medial Ankle: Other: - clobetasol (TCA in clinic) Secondary Dressing: Wound #1 Left,Lateral Lower Leg: Boardered Foam Dressing Wound #3 Left,Medial Ankle: Boardered Foam Dressing Dressing Change Frequency: Wound #1 Left,Lateral Lower Leg: Change dressing every day. Wound #3 Left,Medial Ankle: Change dressing every day. Follow-up Appointments: [facility], BELLUOMINI ([id number]) Wound #1 Left,Lateral Lower Leg: Return Appointment in 1 week. Wound #3 Left,Medial Ankle: Return Appointment in 1 week. Edema Control: Wound #1 Left,Lateral Lower Leg: Patient  to wear own compression stockings Elevate legs to the level of the heart and pump ankles as often as possible Wound #3 Left,Medial Ankle: Patient to wear own compression stockings Elevate legs to the level of the heart and pump ankles as often as possible The following medication(s) was prescribed: clobetasol topical 0.05 % cream cream topical lightly to affected area daily for Pyoderma starting 10/08/2019 1. I am very concerned that the small area on the left medial ankle also represents pyoderma gangrenosum. Raised edges with purplish hue and some of the epithelialization in the center. The only thing else I would consider in the differential diagnosis here would be a tinea infection. After discussion with the patient I am going to give him a prescription for low betazole lightly to the area under border foam or similar compression. He is to change this daily. If this expands I am wrong and this is tinea and we can change the dressing to a topical antifungal although I doubt this. 2. He is started on Remicade. I have 1 similar patient to add protruding muscle who  responded dramatically to this drug with the same condition. If this patient responds he will likely need to be put back in compression to push the underlying muscle back anteriorly to get some degree of decent surface. I do not think we need to do this now 3. Careful follow-up of the area on the left medial ankle Electronic Signature(s) Signed: 10/08/2019 12:54:43 PM By: Linton Ham MD Entered By: Linton Ham on 10/08/2019 08:36:55 [name] ([id number]) -------------------------------------------------------------------------------- SuperBill Details Patient Name: [name] Date of Service: 10/08/2019 Medical Record Number: [id number] Patient Account Number: [id number] Date of Birth/Sex: 1980/09/23 (39 y.o. M) Treating RN: Montey Hora Primary Care Provider: [name] Other Clinician: Referring Provider:  [name] Treating Provider/Extender: Tito Dine in Treatment: 148 Diagnosis Coding ICD-10 Codes Code Description [phone] Non-pressure chronic ulcer of left calf with fat layer exposed L88 Pyoderma gangrenosum L97.321 Non-pressure chronic ulcer of left ankle limited to breakdown of skin I87.2 Venous insufficiency (chronic) (peripheral) L03.116 Cellulitis of left lower limb Facility Procedures CPT4 Code: [id number] Description: 99213 - WOUND CARE VISIT-LEV 3 EST PT Modifier: Quantity: 1 Physician Procedures CPT4 Code: 9417408 Description: 14481 - WC PHYS LEVEL 4 - EST PT Modifier: Quantity: 1 CPT4 Code: Description: ICD-10 Diagnosis Description E56.314 Non-pressure chronic ulcer of left ankle limited to breakdown of skin L88 Pyoderma gangrenosum Modifier: Quantity: Electronic Signature(s) Signed: 10/08/2019 12:54:43 PM By: Linton Ham MD Entered By: Linton Ham on 10/08/2019 08:37:26

## 2019-10-09 MED ORDER — ATORVASTATIN 40 MG TABLET
0 days
Start: 2019-10-09 — End: ?

## 2019-10-11 ENCOUNTER — Other Ambulatory Visit: Payer: Self-pay

## 2019-10-11 ENCOUNTER — Encounter: Payer: Self-pay | Admitting: Internal Medicine

## 2019-10-11 ENCOUNTER — Ambulatory Visit (INDEPENDENT_AMBULATORY_CARE_PROVIDER_SITE_OTHER): Payer: 59 | Admitting: Internal Medicine

## 2019-10-11 ENCOUNTER — Encounter: Payer: 59 | Admitting: Physician Assistant

## 2019-10-11 DIAGNOSIS — L88 Pyoderma gangrenosum: Secondary | ICD-10-CM | POA: Diagnosis not present

## 2019-10-11 NOTE — Assessment & Plan Note (Signed)
I am hopeful that his chronic wound will begin to improve once he is on Remicade a little bit longer.  I do not think that there is a strong indication for restarting antibiotic therapy now.  He will follow-up in 6 weeks.

## 2019-10-11 NOTE — Progress Notes (Signed)
[name] for Infectious Disease  Patient Active Problem List   Diagnosis Date Noted  . 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 Normocytic anemia 07/07/2019  . Former cigarette smoker 06/17/2018  . Ulcerative colitis ([place]) 03/05/2018  . Venous stasis 02/24/2018  . Chronic pain of left knee 12/05/2017  . Chronic pain of left ankle 12/05/2017  . Type 2 diabetes mellitus ([place]) 07/15/2017  . Seasonal allergic rhinitis 11/25/2016  . Depression 04/17/2015  . Hyperlipidemia 04/17/2015    Patient's Medications  New Prescriptions   No medications on file  Previous Medications   ACETAMINOPHEN (TYLENOL) 500 MG TABLET    Take 1,000 mg by mouth every 6 (six) hours as needed for headache (pain).   ATORVASTATIN (LIPITOR) 40 MG TABLET    Take 1 tablet (40 mg total) by mouth every evening. For cholesterol.   BLOOD GLUCOSE METER KIT AND SUPPLIES KIT    Dispense based on patient and insurance preference. Use up to four times daily as directed. (FOR ICD-9 250.00, 250.01).   CEFTAZIDIME 2 G IN DEXTROSE 5 % 50 ML    Inject 2 g into the vein every 8 (eight) hours.   CLOBETASOL OINTMENT (TEMOVATE) 0.05 %    Apply 1 application topically 2 (two) times daily.    FLUTICASONE (FLONASE) 50 MCG/ACT NASAL SPRAY    Place 1 spray into both nostrils 2 (two) times daily.   IBUPROFEN (ADVIL,MOTRIN) 200 MG TABLET    Take 400-600 mg by mouth every 6 (six) hours as needed for headache (pain).   INFLIXIMAB (REMICADE) 100 MG INJECTION    Inject into the muscle once a week.   INSULIN GLARGINE (LANTUS SOLOSTAR) 100 UNIT/ML SOLOSTAR PEN    Inject 20 Units into the skin at bedtime.   INSULIN PEN NEEDLE (PEN NEEDLES) 31G X 6 MM MISC    Use nightly with insulin   METFORMIN (GLUCOPHAGE) 1000 MG TABLET    Take 1 tablet (1,000 mg total) by mouth 2 (two) times daily with a meal. For diabetes.   MUPIROCIN OINTMENT (BACTROBAN) 2 %    Apply topically two (2) 60g times a day. Mix with clobetasol and apply  twice daily   VARENICLINE (CHANTIX STARTING MONTH PAK) 0.5 MG X 11 & 1 MG X 42 TABLET    Take one 0.5 mg tablet by mouth once daily for 3 days, then increase to one 0.5 mg tablet twice daily for 4 days, then increase to one 1 mg tablet twice daily.   VENLAFAXINE XR (EFFEXOR-XR) 150 MG 24 HR CAPSULE    TAKE 1 CAPSULE BY MOUTH ONCE DAILY WITH BREAKFAST   VENLAFAXINE XR (EFFEXOR-XR) 37.5 MG 24 HR CAPSULE    Take 1 capsule (37.5 mg total) by mouth daily with breakfast. For depression  Modified Medications   No medications on file  Discontinued Medications   No medications on file    Subjective: [name] is in for his routine follow-up visit.  He received his first dose of Remicade about 2 weeks ago for his pyoderma gangrenosum.  His left calf lesion has gotten a little bit bigger but he feels like it is looking better and less purple.  The wound is not as deep.  He thinks he may have a new lesion beginning on his left ankle broken the skin yet.  Review of Systems: Review of Systems  Constitutional: Negative for chills, diaphoresis and fever.    Past Medical History:  Diagnosis  Date  . Blood in stool   . Depression   . Elevated blood pressure   . Hyperlipidemia   . OSA (obstructive sleep apnea) 2014  . Seasonal allergies   . Ulcerative colitis ([place]) 03/05/2018    Social History   Tobacco Use  . Smoking status: Former Smoker    Packs/day: 2.00    Years: 14.00    Pack years: 28.00    Types: Cigarettes    Quit date: 11/25/2018    Years since quitting: 0.8  . Smokeless tobacco: Former Systems developer    Quit date: 03/17/2015  Substance Use Topics  . Alcohol use: Yes    Alcohol/week: 0.0 standard drinks    Comment: soical  . Drug use: Not on file    Family History  Problem Relation Age of Onset  . Alcohol abuse Paternal Aunt   . Alcohol abuse Paternal Uncle   . Stroke Paternal Uncle   . Hyperlipidemia Maternal Grandfather   . Hypertension Maternal Grandfather   . Diabetes Maternal  Grandfather   . Alcohol abuse Maternal Grandfather   . Multiple sclerosis Mother   . Dementia Mother     Allergies  Allergen Reactions  . Biaxin [Clarithromycin] Other (See Comments)    Causes colitis flares  . Milk-Related Compounds Hives    Objective: Vitals:   10/11/19 1432  BP: (!) 156/96  Pulse: 99  Temp: 98.6 F (37 C)  SpO2: 97%  Weight: (!) 350 lb (158.8 kg)  Height: 5' 11"  (1.803 m)   Body mass index is 48.82 kg/m.  Physical Exam Constitutional:      General: He is not in acute distress.    Appearance: He is not ill-appearing.  Skin:    Comments: The wound is slightly bigger.  It measures 8 cm in diameter now compared with 7 cm at the time of his last visit.  There is a large amount of serosanguineous drainage on his gauze dressing that was placed this morning.  There is no odor.       Problem List Items Addressed This Visit      High   Pyoderma gangrenosum    I am hopeful that his chronic wound will begin to improve once he is on Remicade a little bit longer.  I do not think that there is a strong indication for restarting antibiotic therapy now.  He will follow-up in 6 weeks.      Relevant Orders   Wound culture       [name] Bickers, MD [facility] [phone] pager   [phone] cell 10/11/2019, 2:52 PM

## 2019-10-12 NOTE — Unmapped (Signed)
VM from [place] requesting a call from [place] regarding verbal order for [employer]. She needed to speak with you directly to accept the verbal order.

## 2019-10-13 NOTE — Unmapped (Signed)
Returned call to [place] at [place].  Gave patient the first initial of my last name to complete the verbal order for the cetirizine.

## 2019-10-14 LAB — WOUND CULTURE
MICRO NUMBER:: 10515552
SPECIMEN QUALITY:: ADEQUATE

## 2019-10-19 ENCOUNTER — Ambulatory Visit: Payer: 59 | Admitting: Physician Assistant

## 2019-10-25 ENCOUNTER — Encounter: Payer: 59 | Attending: Physician Assistant | Admitting: Physician Assistant

## 2019-10-25 ENCOUNTER — Other Ambulatory Visit: Payer: Self-pay

## 2019-10-25 DIAGNOSIS — K5190 Ulcerative colitis, unspecified, without complications: Secondary | ICD-10-CM | POA: Diagnosis not present

## 2019-10-25 DIAGNOSIS — L88 Pyoderma gangrenosum: Secondary | ICD-10-CM | POA: Diagnosis not present

## 2019-10-25 DIAGNOSIS — L97321 Non-pressure chronic ulcer of left ankle limited to breakdown of skin: Secondary | ICD-10-CM | POA: Diagnosis not present

## 2019-10-25 DIAGNOSIS — G4730 Sleep apnea, unspecified: Secondary | ICD-10-CM | POA: Diagnosis not present

## 2019-10-25 DIAGNOSIS — L97222 Non-pressure chronic ulcer of left calf with fat layer exposed: Secondary | ICD-10-CM | POA: Insufficient documentation

## 2019-10-25 DIAGNOSIS — T781XXA Other adverse food reactions, not elsewhere classified, initial encounter: Secondary | ICD-10-CM | POA: Diagnosis not present

## 2019-10-25 DIAGNOSIS — L03116 Cellulitis of left lower limb: Secondary | ICD-10-CM | POA: Insufficient documentation

## 2019-10-25 DIAGNOSIS — I10 Essential (primary) hypertension: Secondary | ICD-10-CM | POA: Insufficient documentation

## 2019-10-25 DIAGNOSIS — I872 Venous insufficiency (chronic) (peripheral): Secondary | ICD-10-CM | POA: Insufficient documentation

## 2019-10-25 DIAGNOSIS — L280 Lichen simplex chronicus: Secondary | ICD-10-CM | POA: Diagnosis not present

## 2019-10-25 DIAGNOSIS — X58XXXA Exposure to other specified factors, initial encounter: Secondary | ICD-10-CM | POA: Insufficient documentation

## 2019-10-25 NOTE — Progress Notes (Signed)
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 M) Treating RN: [name] Primary Care [name]: [name] Other Clinician: Referring [name]: [name] Treating [name]/Extender: [name], [name]: 150 Visit Information History Since Last Visit Added or deleted any medications: No Patient Arrived: Ambulatory Any new allergies or adverse reactions: No Arrival Time: 08:11 Had a fall or experienced change in No Accompanied By: self activities of daily living that may affect Transfer Assistance: None risk of falls: Patient Identification Verified: Yes Signs or symptoms of abuse/neglect since last visito No Secondary Verification Process Completed: Yes Hospitalized since last visit: No Patient Requires Transmission-Based No Implantable device outside of the clinic excluding No Precautions: cellular tissue based products placed in the center Patient Has Alerts: Yes since last visit: Patient Alerts: 08/09/19 Picc Line Has Dressing in Place as Prescribed: Yes Right Has Compression in Place as Prescribed: Yes Pain Present Now: No Electronic Signature(s) Signed: 10/25/2019 10:54:03 AM By: [name], [name], RN, [name], [name], [name] Entered By: [name], [name], RN, [name], [name] on 10/25/2019 10:54:02 [name] ([id number]) -------------------------------------------------------------------------------- Clinic Level of Care Assessment Details Patient Name: [name] Date of Service: 10/25/2019 8:15 AM Medical Record Number: [id number] Patient Account Number: [id number] Date of Birth/Sex: 1980/06/16 (39 y.o. M) Treating RN: [name] Primary Care Gissela Bloch: [name] Other Clinician: Referring [name]: [name] Treating [name]/Extender: [name],  [name]: 150 Clinic Level of Care Assessment Items TOOL 4 Quantity Score []  - Use when only an EandM is performed on FOLLOW-UP visit 0 ASSESSMENTS - Nursing Assessment / Reassessment X - Reassessment of Co-morbidities (includes updates in patient status) 1 10 X- 1 5 Reassessment of Adherence to [name] Plan ASSESSMENTS - Wound and Skin Assessment / Reassessment []  - Simple Wound Assessment / Reassessment - one wound 0 X- 2 5 Complex Wound Assessment / Reassessment - multiple wounds []  - 0 Dermatologic / Skin Assessment (not related to wound area) ASSESSMENTS - Focused Assessment []  - Circumferential Edema Measurements - multi extremities 0 []  - 0 Nutritional Assessment / Counseling / Intervention []  - 0 Lower Extremity Assessment (monofilament, tuning fork, pulses) []  - 0 Peripheral Arterial Disease Assessment (using hand held doppler) ASSESSMENTS - Ostomy and/or Continence Assessment and Care []  - Incontinence Assessment and Management 0 []  - 0 Ostomy Care Assessment and Management (repouching, etc.) PROCESS - Coordination of Care X - Simple Patient / Family Education for ongoing care 1 15 []  - 0 Complex (extensive) Patient / Family Education for ongoing care []  - 0 Staff obtains Programmer, systems, Records, Test Results / Process Orders []  - 0 Staff telephones HHA, Nursing Homes / Clarify orders / etc []  - 0 Routine Transfer to another Facility (non-emergent condition) []  - 0 Routine Hospital Admission (non-emergent condition) []  - 0 New Admissions / Biomedical engineer / Ordering NPWT, Apligraf, etc. []  - 0 Emergency Hospital Admission (emergent condition) X- 1 10 Simple Discharge Coordination []  - 0 Complex (extensive) Discharge Coordination PROCESS - Special Needs []  - Pediatric / Minor Patient Management 0 []  - 0 Isolation Patient Management []  - 0 Hearing / Language / Visual special needs []  - 0 Assessment of Community assistance  (transportation, D/C planning, etc.) []  - 0 Additional assistance / Altered mentation []  - 0 Support Surface(s) Assessment (bed, cushion, seat, etc.) INTERVENTIONS - Wound Cleansing / Measurement [name], [name] ([id number]) []  -  0 Simple Wound Cleansing - one wound X- 2 5 Complex Wound Cleansing - multiple wounds X- 1 5 Wound Imaging (photographs - any number of wounds) []  - 0 Wound Tracing (instead of photographs) []  - 0 Simple Wound Measurement - one wound X- 2 5 Complex Wound Measurement - multiple wounds INTERVENTIONS - Wound Dressings []  - Small Wound Dressing one or multiple wounds 0 X- 2 15 Medium Wound Dressing one or multiple wounds []  - 0 Large Wound Dressing one or multiple wounds []  - 0 Application of Medications - topical []  - 0 Application of Medications - injection INTERVENTIONS - Miscellaneous []  - External ear exam 0 []  - 0 Specimen Collection (cultures, biopsies, blood, body fluids, etc.) []  - 0 Specimen(s) / Culture(s) sent or taken to Lab for analysis []  - 0 Patient Transfer (multiple staff / Civil Service fast streamer / Similar devices) []  - 0 Simple Staple / Suture removal (25 or less) []  - 0 Complex Staple / Suture removal (26 or more) []  - 0 Hypo / Hyperglycemic Management (close monitor of Blood Glucose) []  - 0 Ankle / Brachial Index (ABI) - do not check if billed separately X- 1 5 Vital Signs Has the patient been seen at the hospital within the last three years: Yes Total Score: 110 Level Of Care: New/Established - Level 3 Electronic Signature(s) Signed: 10/25/2019 12:56:57 PM By: [name] Entered By: [name] on 10/25/2019 08:41:06 [name] ([id number]) -------------------------------------------------------------------------------- Encounter Discharge Information Details Patient Name: [name] Date of Service: 10/25/2019 8:15 AM Medical Record Number: [id number] Patient Account Number: [id number] Date of Birth/Sex: [birth year] (39 y.o.  M) Treating RN: [name] Primary Care Maleiya Pergola: [name] Other Clinician: Referring Jhaniya Briski: [name] Treating [name]/Extender: [name], [name]: 150 Encounter Discharge Information Items Discharge Condition: Stable Ambulatory Status: Ambulatory Discharge Destination: Home Transportation: Private Auto Accompanied By: self Schedule Follow-up Appointment: Yes Clinical Summary of Care: Electronic Signature(s) Signed: 10/25/2019 49:67:59 PM By: [name] Entered By: [name] on 10/25/2019 08:42:01 [name] ([id number]) -------------------------------------------------------------------------------- Lower Extremity Assessment Details Patient Name: [name] Date of Service: 10/25/2019 8:15 AM Medical Record Number: [id number] Patient Account Number: [id number] Date of Birth/Sex: 1980-07-27 (39 y.o. M) Treating RN: [name] Primary Care Azhar [name]: [name] Other Clinician: Referring Mylah Baynes: [name] Treating Mora Pedraza/Extender: [name], [name]: 150 Edema Assessment Assessed: [Left: No] [Right: No] Edema: [Left: Ye] [Right: s] Vascular Assessment Pulses: Dorsalis Pedis Palpable: [Left:Yes] Electronic Signature(s) Signed: 10/25/2019 4:03:27 PM By: [name] Entered By: [name] on 10/25/2019 08:17:43 [name], [name] ([id number]) -------------------------------------------------------------------------------- Multi Wound Chart Details Patient Name: [name] Date of Service: 10/25/2019 8:15 AM Medical Record Number: [id number] Patient Account Number: [id number] Date of Birth/Sex: [birth year] (39 y.o. M) Treating RN: [name] Primary Care Domonic Hiscox: [name] Other Clinician: Referring Kaelan Emami: [name] Treating Chelsi Warr/Extender: [name], [name]: 150 Vital Signs Height(in): 71 Pulse(bpm): 36 Weight(lbs): 338 Blood Pressure(mmHg):  131/70 Body Mass Index(BMI): 47 Temperature(F): 98.6 Respiratory Rate(breaths/min): 16 Photos: [N/A:N/A] Wound Location: Left, Lateral Lower Leg Left, Medial Ankle N/A Wounding Event: Gradually Appeared Gradually Appeared N/A Primary Etiology: Pyoderma Venous Leg Ulcer N/A Comorbid History: Sleep Apnea, Hypertension, Colitis Sleep Apnea, Hypertension, Colitis N/A Date Acquired: 11/18/2015 09/18/2019 N/A Weeks of [name]: 150 2 N/A Wound Status: Open Open N/A Measurements L x W x D (cm) 7.5x8.4x0.7 0.7x1.2x0.1 N/A Area (cm) : 49.48 0.66 N/A Volume (cm) : 34.636 0.066 N/A % Reduction in Area: -907.90% 37.70% N/A % Reduction in Volume: -  782.00% 37.70% N/A Classification: Full Thickness With Exposed Full Thickness Without Exposed N/A Support Structures Support Structures Exudate Amount: Medium None Present N/A Exudate Type: Purulent N/A N/A Exudate Color: yellow, brown, green N/A N/A Wound Margin: Epibole Flat and Intact N/A Granulation Amount: Large (67-100%) None Present (0%) N/A Granulation Quality: Pink N/A N/A Necrotic Amount: Small (1-33%) None Present (0%) N/A Exposed Structures: Fat Layer (Subcutaneous Tissue) Fascia: No N/A Exposed: Yes Fat Layer (Subcutaneous Tissue) Muscle: Yes Exposed: No Fascia: No Tendon: No Tendon: No Muscle: No Joint: No Joint: No Bone: No Bone: No Limited to Skin Breakdown Epithelialization: None Large (67-100%) N/A [name] Notes Electronic Signature(s) Signed: 10/25/2019 12:56:57 PM By: [name] Entered By: [name] on 10/25/2019 08:33:17 [name] ([id number]) -------------------------------------------------------------------------------- [place] Details Patient Name: [name] Date of Service: 10/25/2019 8:15 AM Medical Record Number: [id number] Patient Account Number: [id number] Date of Birth/Sex: 12-May-1981 (39 y.o. M) Treating RN: [name] Primary Care Lamiah Marmol: [name] Other  Clinician: Referring Jamilee Lafosse: [name] Treating Koki Buxton/Extender: [name], [name]: 150 Active Inactive Venous Leg Ulcer Nursing Diagnoses: Knowledge deficit related to disease process and management Potential for venous Insuffiency (use before diagnosis confirmed) Goals: Non-invasive venous studies are completed as ordered Date Initiated: 12/11/2016 Target Resolution Date: 03/13/2017 Goal Status: Active Patient will maintain optimal edema control Date Initiated: 12/11/2016 Target Resolution Date: 03/13/2017 Goal Status: Active Patient/caregiver will verbalize understanding of disease process and disease management Date Initiated: 12/11/2016 Target Resolution Date: 03/13/2017 Goal Status: Active Verify adequate tissue perfusion prior to therapeutic compression application Date Initiated: 12/11/2016 Target Resolution Date: 03/13/2017 Goal Status: Active Interventions: Assess peripheral edema status every visit. Compression as ordered Provide education on venous insufficiency [name] Activities: Therapeutic compression applied : 12/11/2016 Notes: Wound/Skin Impairment Nursing Diagnoses: Impaired tissue integrity Knowledge deficit related to ulceration/compromised skin integrity Goals: Patient/caregiver will verbalize understanding of skin care regimen Date Initiated: 12/11/2016 Target Resolution Date: 03/13/2017 Goal Status: Active Ulcer/skin breakdown will have a volume reduction of 30% by week 4 Date Initiated: 12/11/2016 Target Resolution Date: 03/13/2017 Goal Status: Active Ulcer/skin breakdown will have a volume reduction of 50% by week 8 Date Initiated: 12/11/2016 Target Resolution Date: 03/13/2017 Goal Status: Active Ulcer/skin breakdown will have a volume reduction of 80% by week 12 Date Initiated: 12/11/2016 Target Resolution Date: 03/13/2017 Goal Status: Active Ulcer/skin breakdown will heal within 14 weeks Date Initiated:  12/11/2016 Target Resolution Date: 03/13/2017 Goal Status: Active [name], [name] ([id number]) Interventions: Assess patient/caregiver ability to obtain necessary supplies Assess patient/caregiver ability to perform ulcer/skin care regimen upon admission and as needed Assess ulceration(s) every visit Provide education on ulcer and skin care [name] Activities: Skin care regimen initiated : 12/11/2016 Topical wound management initiated : 12/11/2016 Notes: Electronic Signature(s) Signed: 10/25/2019 12:56:57 PM By: [name] Entered By: [name] on 10/25/2019 08:32:39 [name] ([id number]) -------------------------------------------------------------------------------- Pain Assessment Details Patient Name: [name] Date of Service: 10/25/2019 8:15 AM Medical Record Number: [id number] Patient Account Number: [id number] Date of Birth/Sex: 1981/01/01 (39 y.o. M) Treating RN: [name] Primary Care Kolin Erdahl: [name] Other Clinician: Referring Io Dieujuste: [name] Treating Bradlee Bridgers/Extender: [name], [name]: 150 Active Problems Location of Pain Severity and Description of Pain Patient Has Paino Yes Site Locations Pain Location: Pain in Ulcers With Dressing Change: Yes Pain Management and Medication Current Pain Management: Electronic Signature(s) Signed: 10/25/2019 4:03:27 PM By: [name] Entered By: [name] on 10/25/2019 08:12:02 [name] ([id number]) -------------------------------------------------------------------------------- Patient/Caregiver Education Details Patient Name: [name]  Date of Service: 10/25/2019 8:15 AM Medical Record Number: [id number] Patient Account Number: [id number] Date of Birth/Gender: [birth year] (39 y.o. M) Treating RN: [name] Primary Care Physician: [name] Other Clinician: Referring Physician: Alma [name] Treating Physician/Extender: Sharalyn Ink in  [name]: 150 Education Assessment Education Provided To: Patient Education Topics Provided Wound/Skin Impairment: Handouts: Caring for Your Ulcer Methods: Demonstration, Explain/Verbal Responses: State content correctly Electronic Signature(s) Signed: 10/25/2019 12:56:57 PM By: [name] Entered By: [name] on 10/25/2019 08:41:18 [name] ([id number]) -------------------------------------------------------------------------------- Wound Assessment Details Patient Name: [name] Date of Service: 10/25/2019 8:15 AM Medical Record Number: [id number] Patient Account Number: [id number] Date of Birth/Sex: Nov 18, 1980 (39 y.o. M) Treating RN: [name] Primary Care Roselani Grajeda: [name] Other Clinician: Referring Matson Welch: [name] Treating Treyveon Mochizuki/Extender: [name], [name]: 150 Wound Status Wound Number: 1 Primary Etiology: Pyoderma Wound Location: Left, Lateral Lower Leg Wound Status: Open Wounding Event: Gradually Appeared Comorbid History: Sleep Apnea, Hypertension, Colitis Date Acquired: 11/18/2015 Weeks Of [name]: 150 Clustered Wound: No Photos Wound Measurements Length: (cm) 7.5 Width: (cm) 8.4 Depth: (cm) 0.7 Area: (cm) 49.48 Volume: (cm) 34.636 % Reduction in Area: -907.9% % Reduction in Volume: -782% Epithelialization: None Tunneling: No Undermining: No Wound Description Classification: Full Thickness With Exposed Support Structures Wound Margin: Epibole Exudate Amount: Medium Exudate Type: Purulent Exudate Color: yellow, brown, green Foul Odor After Cleansing: No Slough/Fibrino Yes Wound Bed Granulation Amount: Large (67-100%) Exposed Structure Granulation Quality: Pink Fascia Exposed: No Necrotic Amount: Small (1-33%) Fat Layer (Subcutaneous Tissue) Exposed: Yes Necrotic Quality: Adherent Slough Tendon Exposed: No Muscle Exposed: Yes Necrosis of Muscle: No Joint Exposed: No Bone Exposed:  No [name] Notes Wound #1 (Left, Lateral Lower Leg) Notes gent oinTment on lateral leg TCA on medial with BFD [name], [name] ([id number]) Electronic Signature(s) Signed: 10/25/2019 4:03:27 PM By: [name] Entered By: [name] on 10/25/2019 08:21:23 [name] ([id number]) -------------------------------------------------------------------------------- Wound Assessment Details Patient Name: [name] Date of Service: 10/25/2019 8:15 AM Medical Record Number: [id number] Patient Account Number: [id number] Date of Birth/Sex: 1980/10/07 (39 y.o. M) Treating RN: [name] Primary Care Donivan Thammavong: [name] Other Clinician: Referring Cleofas Hudgins: [name] Treating Tommaso Cavitt/Extender: [name], [name]: 150 Wound Status Wound Number: 3 Primary Etiology: Venous Leg Ulcer Wound Location: Left, Medial Ankle Wound Status: Open Wounding Event: Gradually Appeared Comorbid History: Sleep Apnea, Hypertension, Colitis Date Acquired: 09/18/2019 Weeks Of [name]: 2 Clustered Wound: No Photos Wound Measurements Length: (cm) 0.7 Width: (cm) 1.2 Depth: (cm) 0.1 Area: (cm) 0.66 Volume: (cm) 0.066 % Reduction in Area: 37.7% % Reduction in Volume: 37.7% Epithelialization: Large (67-100%) Wound Description Classification: Full Thickness Without Exposed Support Structure Wound Margin: Flat and Intact Exudate Amount: None Present s Foul Odor After Cleansing: No Slough/Fibrino No Wound Bed Granulation Amount: None Present (0%) Exposed Structure Necrotic Amount: None Present (0%) Fascia Exposed: No Fat Layer (Subcutaneous Tissue) Exposed: No Tendon Exposed: No Muscle Exposed: No Joint Exposed: No Bone Exposed: No Limited to Skin Breakdown [name] Notes Wound #3 (Left, Medial Ankle) Notes gent oinTment on lateral leg TCA on medial with BFD Electronic Signature(s) Signed: 10/25/2019 4:03:27 PM By: Arsenio Katz, [name]  ([id number]) Entered By: [name] on 10/25/2019 08:21:53 [name] ([id number]) -------------------------------------------------------------------------------- [place] Details Patient Name: [name] Date of Service: 10/25/2019 8:15 AM Medical Record Number: [id number] Patient Account Number: [id number] Date of Birth/Sex: 06-11-1980 (39 y.o. M) Treating RN: [name] Primary Care Leonidus Rowand: [name] Other Clinician: Referring Bentlie Withem: [name] Treating  Bensen Chadderdon/Extender: [name], [name]: 150 Vital Signs Time Taken: 08:12 Temperature (F): 98.6 Height (in): 71 Pulse (bpm): 76 Weight (lbs): 338 Respiratory Rate (breaths/min): 16 Body Mass Index (BMI): 47.1 Blood Pressure (mmHg): 131/70 Reference Range: 80 - 120 mg / dl Electronic Signature(s) Signed: 10/25/2019 4:03:27 PM By: [name] Entered By: [name] on 10/25/2019 08:15:44

## 2019-10-25 NOTE — Progress Notes (Addendum)
DEVYNN, [name] ([id number]) Visit Report for 10/25/2019 Chief Complaint Document Details Patient Name: [name] Date of Service: 10/25/2019 8:15 AM Medical Record Number: [id number] Patient Account Number: [id number] Date of Birth/Sex: Apr 03, 1981 (39 y.o. 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 M) Treating RN: Army Melia Primary Care Provider: [name] Other Clinician: Referring Provider: [name] Treating Provider/Extender: Melburn Hake, Aretta Stetzel Weeks in Treatment: 150 History of Present Illness HPI Description: 12/04/16; 39 year old man who comes into the clinic today for review of a wound on the posterior left calf. He tells me that is been there for about a year. He is not a diabetic he does smoke half a pack per day. He was seen in the ER on 11/20/16 felt to have cellulitis around the wound and was given clindamycin. An x-ray did not show osteomyelitis. The patient initially tells me that he has a milk allergy that sets off a pruritic itching rash on his lower legs which she scratches incessantly and he thinks that's what may have set up the wound. He has been using various topical antibiotics and ointments without any effect. He works in a [employer] and is on his feet all day. He does not have a  prior history of wounds however he does have the rash on both lower legs the right arm and the ventral aspect of his left arm. These are excoriations and clearly have had scratching however there are of macular looking areas on both legs including a substantial larger area on the right leg. This does not have an underlying open area. There is no blistering. The patient tells me that 2 years ago in [place] in response to the rash on his legs he saw a dermatologist who told him he had a condition which may be pyoderma gangrenosum although I may be putting words into his mouth. He seemed to recognize this. 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 He is not in any treatment for this. He's had no recent travel 12/11/16; the patient arrives today with his wound and roughly the same condition we've been using silver alginate this is a deep punched out wound with some surrounding erythema but no tenderness. Biopsy I did did not show confirmed pyoderma gangrenosum suggested nonspecific inflammation and vasculitis but does not provide an actual description of what was seen by the pathologist. I'm really not able to understand this We have also received information from the patient's dermatologist in [place] notes from April 2016. This was a doctor Agarwal-antal. 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 He was prescribed topical steroid high potency under occlusion which helped but at this point the patient did not have a deep punched out wound. 12/18/16; the patient's wound is larger in terms of surface area however this surface looks better and there is less depth. The surrounding erythema also is better. The patient states that the wrap we put on came off 2 days ago when he has been using his  compression stockings. He we are in the process of getting a dermatology consult. 12/26/16 on evaluation today patient's left lower extremity wound shows evidence of  infection with surrounding erythema noted. He has been tolerating the dressing changes but states that he has noted more discomfort. There is a larger area of erythema surrounding the wound. No fevers, chills, nausea, or vomiting noted at this time. With that being said the wound still does have slough covering the surface. He is not allergic to any medication that he is aware of at this point. In regard to his right lower extremity he had several regions that are erythematous and pruritic he wonders if there's anything we can do to help that. 01/02/17 I reviewed patient's wound culture which was obtained his visit last week. He was placed on doxycycline at that point. Unfortunately that does not appear to be an antibiotic that would likely help with the situation however the pseudomonas noted on culture is sensitive to Cipro. Also unfortunately patient's wound seems to have a large compared to last week's evaluation. Not severely so but there are definitely increased measurements in general. He is continuing to have discomfort as well he writes this to be a seven out of 10. In fact he would prefer me not to perform any debridement today due to the fact that he is having discomfort and considering he has an active infection on the little reluctant to do so anyway. No fevers, chills, nausea, or vomiting noted at this time. 01/08/17; patient seems dermatology on September 5. I suspect dermatology will want the slides from the biopsy I did sent to their pathologist. I'm not sure if there is a way we can expedite that. In any case the culture I did before I left on vacation 3 weeks ago showed Pseudomonas he was given 10 days of Cipro and per her description of her intake nurses is actually somewhat better this week although the wound is quite a bit bigger than I remember the last time I saw this. He still has 3 more days of Cipro 01/21/17; dermatology appointment tomorrow. He has completed the ciprofloxacin  for Pseudomonas. Surface of the wound looks better however he is had some deterioration in the lesions on his right leg. Meantime the left lateral leg wound we will continue with sample 01/29/17; patient had his dermatology appointment but I can't yet see that note. He is completed his antibiotics. The wound is more superficial but considerably larger in circumferential area than when he came in. This is in his left lateral calf. He also has swollen erythematous areas with superficial wounds on the right leg and small papular areas on both arms. There apparently areas in her his upper thighs and buttocks I did not look at those. Dermatology biopsied the right leg. Hopefully will have their input next week. 02/05/17; patient went back to see his dermatologist who told him that he had a "scratching problem" as well as staph. He is now on a 30 day course of doxycycline and I believe she gave him triamcinolone cream to the right leg areas to help with the itching [not exactly sure but probably triamcinolone]. She apparently looked at the left lateral leg wound although this was not rebiopsied and I think felt to be ultimately part of the same pathogenesis. He is using sample border foam and changing nevus himself. He now has a new open area on the right posterior leg which was his biopsy site I don't have any of the  dermatology notes 02/12/17; we put the patient in compression last week with SANTYL to the wound on the left leg and the biopsy. Edema is much better and the depth of the wound is now at level of skin. Area is still the same oBiopsy site on the right lateral leg we've also been using santyl with a border foam dressing and he is changing this himself. 02/19/17; Using silver alginate started last week to both the substantial left leg wound and the biopsy site on the right wound. He is tolerating compression well. Has a an appointment with his primary M.D. tomorrow wondering about diuretics although  I'm wondering if the edema problem is actually lymphedema 02/26/17; the patient has been to see his primary doctor Dr. Jerrel Ivory at [place] our primary care. She started him on Lasix 20 mg and this seems to have helped with the edema. However we are not making substantial change with the left lateral calf wound and inflammation. The biopsy site on the right leg also looks stable but not really all that different. 03/12/17; the patient has been to see vein and vascular Dr. Lucky Cowboy. He has had venous reflux studies I have not reviewed these. I did get a call from his dermatology office. They felt that he might have pathergy based on their biopsy on his right leg which led them to look at the slides of REMER, COUSE ([id number]) the biopsy I did on the left leg and they wonder whether this represents pyoderma gangrenosum which was the original supposition in a man with ulcerative colitis albeit inactive for many years. They therefore recommended clobetasol and tetracycline i.e. 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 03/26/17; apparently the patient just had reflux studies not an appointment with Dr. dew. She arrives in clinic today having applied clobetasol for 2-3 weeks. He notes over the last 2-3 days excessive drainage having to change the dressing 3-4 times a day and also expanding erythema. 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 Lillia Carmel at [facility] dermatology locally who kindly saw him at my request. 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 My concern was the degree of erythema medially extending from his left leg wound which was either inflammation from pyoderma  or cellulitis. I put him on Augmentin however culture of the wound showed Pseudomonas which is quinolone sensitive. I really don't believe he has cellulitis however in view of everything I will continue and give him a course of Cipro. He is also on doxycycline as an immune modulator for the pyoderma. In addition to his original wound on the left lateral leg with surrounding erythema he has a wound on the right posterior calf which was an original biopsy site done by dermatology. 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 Saw Dr. Lillia Carmel yesterday. He has been using topical antibiotics to both wound areas his original wound on the left and the biopsies/pathergy area on the right. There is definitely some improvement in the inflammation around the wound on the right although the patient states he has increasing sensitivity of the wounds. He is on prednisone 60 and doxycycline 1 as prescribed by Dr. Lillia Carmel. He is covering the topical antibiotic  with gauze and putting this in his own compression stocks and changing this daily. He states that Dr. Lottie Rater did a culture of the left leg wound yesterday 05/07/17; pyoderma gangrenosum. The patient saw Dr. Lillia Carmel yesterday and has a follow-up with her in one month. He is still using topical antibiotics to both wounds although he can't recall exactly what type. He is still on prednisone 60 mg. Dr. Lillia Carmel stated that the doxycycline could stop if we were in agreement. 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 The right medial leg was induced by biopsy/pathergy. The area on the right is essentially healed. 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 The right medial leg has closed and remains closed. He  is still on prednisone 60. oHe tells me he missed his last dermatology appointment with Dr. Lillia Carmel but will make another appointment. He reports that her blood sugar at a recent screen in [place] was high 200's. He was 180 today. 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 I think he is going to require still much longer prednisone perhaps another 3 months before attempting to taper. In the meantime his wound is a lot better. Smaller. He is cleaning this off daily and applying topical antibiotics. When he was last in the clinic I thought about changing to [facility] and actually put in a couple of calls to dermatology although probably not during their business hours. 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 He continues on oral prednisone. He has been using triple antibiotic ointment. There is surface debris and we will transition to [facility] and have him return in 2 weeks. He has lost 30 pounds since his last appointment with lifestyle modification. He may benefit from topical steroid cream for treatment this can be considered at a later date. 08/22/17 on evaluation today patient appears to actually be doing rather well in regard to his left lateral lower extremity ulcer. He has actually been managed by Dr. Dellia Nims most recently. Patient is currently on oral steroids at this time. This seems to have been of benefit for him. Nonetheless his last visit was actually with Leah on 08/06/17. Currently he is not utilizing any topical steroid creams although this could be of benefit as well. No fevers, chills, nausea, or vomiting noted at this time. 09/05/17 on evaluation today patient appears to be doing better in regard to his left lateral lower extremity ulcer. He has been tolerating the dressing changes without complication. He is using Santyl with good effect. Overall I'm  very pleased with how things are standing at this point. Patient likewise is happy that this is doing better. 09/19/17 on evaluation today patient actually appears to be doing rather well in regard to his left lateral lower extremity ulcer. 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 He's not having any significant pain. 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 He has been tolerating the Santyl without complication and in general I feel like he's making good progress. 10/17/17 on evaluation  today patient appears to be doing very well in regard to his left lateral lower surety ulcer. He has been tolerating the dressing changes without complication. There does not appear to be any evidence of infection he's alternating the Santyl and the triple antibiotic ointment every other day this seems to be doing well for him. 11/03/17 on evaluation today patient appears to be doing very well in regard to his left lateral lower extremity ulcer. He is been tolerating the dressing changes without complication which is good news. Fortunately there does not appear to be any evidence of infection which is also great news. Overall is doing excellent they are starting to taper down on the prednisone is down to 40 mg at this point it also started topical clobetasol for him. 11/17/17 on evaluation today patient appears to be doing well in regard to his left lateral lower surety ulcer. He's been tolerating the dressing changes without complication. He does note that he is having no pain, no excessive drainage or discharge, and overall he feels like things are going about how he would expect and hope they would. Overall he seems to have no evidence of infection at this time in my opinion which is good news. 12/04/17-He is seen in follow-up  evaluation for right lateral lower extremity ulcer. He has been applying topical steroid cream. Today's measurement show slight increase in size. Over the next 2 weeks we will transition to every other day Santyl and steroid cream. He has been encouraged to monitor for changes and notify clinic with any concerns 12/15/17 on evaluation today patient's left lateral motion the ulcer and fortunately is doing worse again at this point. This just since last week to this week has close to doubled in size according to the patient. I did not seeing last week's I do not have a visual to compare this to in our system was also down so we do not have all the charts and at this point. Nonetheless it does have me somewhat concerned in regard to the fact that again he was worried enough about it he has contact the dermatology that placed them back on the full strength, 50 mg a day of the prednisone that he was taken previous. He continues to alternate using clobetasol along with Santyl at this point. He is obviously somewhat frustrated. 12/22/17 on evaluation today patient appears to be doing a little worse compared to last evaluation. Unfortunately the wound is a little deeper and slightly larger than the last week's evaluation. With that being said he has made some progress in regard to the irritation surrounding at this time unfortunately despite that progress that's been made he still has a significant issue going on here. 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 [name], [name] ([id number]) For that reason I discussed with him today the possibility of perform a wound culture to ensure there's nothing overtly infected. 01/06/18 on evaluation today patient's wound is larger and deeper than previously evaluated. With that being said it did appear that his wound was  infected after my last evaluation with him. Subsequently I did end up prescribing a prescription for Bactrim DS which she has been taking and having no complication with. Fortunately there does not appear to be any evidence of infection at this point in time as far as anything spreading, no want to touch, and overall I  feel like things are showing signs of improvement. 01/13/18 on evaluation today patient appears to be even a little larger and deeper than last time. There still muscle exposed in the base of the wound. Nonetheless he does appear to be less erythematous I do believe inflammation is calming down also believe the infection looks like it's probably resolved at this time based on what I'm seeing. No fevers, chills, nausea, or vomiting noted at this time. 01/30/18 on evaluation today patient actually appears to visually look better for the most part. Unfortunately those visually this looks better he does seem to potentially have what may be an abscess in the muscle that has been noted in the central portion of the wound. This is the first time that I have noted what appears to be fluctuance in the central portion of the muscle. With that being said I'm somewhat more concerned about the fact that this might indicate an abscess formation at this location. I do believe that an ultrasound would be appropriate. This is likely something we need to try to do as soon as possible. He has been switch to mupirocin ointment and he is no longer using the steroid ointment as prescribed by dermatology he sees them again next week he's been decreased from 60 to 40 mg of prednisone. 03/09/18 on evaluation today patient actually appears to be doing a little better compared to last time I saw him. There's not as much erythema surrounding the wound itself. He I did review his most recent infectious disease note which was dated 02/24/18. He saw Dr. Michel Bickers in [place]. 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 Patient is now off of antibiotics and they are continually observing this. There seems to be no change in the past two weeks in my pinion based on what the patient says and what I see today compared to what Dr. Megan Salon likely saw two weeks ago. No fevers, chills, nausea, or vomiting noted at this time. 03/23/18 on evaluation today patient's wound actually appears to be showing signs of improvement which is good news. He is currently still on the Dapsone. He is also working on tapering the prednisone to get off of this and Dr. Lottie Rater is working with him in this regard. Nonetheless overall I feel like the wound is doing well it does appear based on the infectious disease note that I reviewed from Dr. 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 I did also review the results of his ultrasound of left lower extremity which revealed there was a dentist tissue in the base of the wound without an abscess noted. 04/06/18 on evaluation today the patient's left lateral lower extremity ulcer actually appears to be doing fairly well which is excellent news. There does not appear to be any evidence of infection at this time which is also great news. Overall he still does have a significantly large ulceration although little by little he seems to be making progress. He is down to 10 mg a day of the prednisone. 04/20/18 on evaluation today patient actually appears to be doing excellent at this time in regard to his left lower extremity ulcer. He's making signs of good progress unfortunately this is taking much longer than we would really like to see but nonetheless  he is making progress. Fortunately there does not appear to be any evidence of infection at this time. No fevers, chills, nausea, or vomiting noted at  this time. The patient has not been using the Santyl due to the cost he hadn't got in this field yet. He's mainly been using the antibiotic ointment topically. Subsequently he also tells me that he really has not been scrubbing in the shower I think this would be helpful again as I told him it doesn't have to be anything too aggressive to even make it believe just enough to keep it free of some of the loose slough and biofilm on the wound surface. 05/11/18 on evaluation today patient's wound appears to be making slow but sure progress in regard to the left lateral lower extremity ulcer. He is been tolerating the dressing changes without complication. Fortunately there does not appear to be any evidence of infection at this time. He is still just using triple antibiotic ointment along with clobetasol occasionally over the area. He never got the Santyl and really does not seem to intend to in my pinion. 06/01/18 on evaluation today patient appears to be doing a little better in regard to his left lateral lower extremity ulcer. He states that overall he does not feel like he is doing as well with the Dapsone as he did with the prednisone. Nonetheless he sees his dermatologist later today and is gonna talk to them about the possibility of going back on the prednisone. Overall again I believe that the wound would be better if you would utilize Santyl but he really does not seem to be interested in going back to the [place] at this point. He has been using triple antibiotic ointment. 06/15/18 on evaluation today patient's wound actually appears to be doing about the same at this point. Fortunately there is no signs of infection at this time. He has made slight improvements although he continues to not really want to clean the wound bed at this point. He states that he just doesn't mess with it he doesn't want to cause any problems with everything else he has going on. He has been on medication, antibiotics  as prescribed by his dermatologist, for a staff infection of his lower extremities which is really drying out now and looking much better he tells me. Fortunately there is no sign of overall infection. 06/29/18 on evaluation today patient appears to be doing well in regard to his left lateral lower surety ulcer all things considering. Fortunately his staff infection seems to be greatly improved compared to previous. He has no signs of infection and this is drying up quite nicely. He is still the doxycycline for this is no longer on cental, Dapsone, or any of the other medications. His dermatologist has recommended possibility of an infusion but right now he does not want to proceed with that. 07/13/18 on evaluation today patient appears to be doing about the same in regard to his left lateral lower surety ulcer. Fortunately there's no signs of infection at this time which is great news. Unfortunately he still builds up a significant amount of Slough/biofilm of the surface of the wound he still is not really cleaning this as he should be appropriately. Again I'm able to easily with saline and gauze remove the majority of this on the surface which if you would do this at home would likely be a dramatic improvement for him as far as getting the area to improve. Nonetheless overall I still feel  like he is making progress is just very slow. I think Santyl will be of benefit for him as well. Still he has not gotten this as of this point. 07/27/18 on evaluation today patient actually appears to be doing little worse in regards of the erythema around the periwound region of the wound he also tells me that he's been having more drainage currently compared to what he was experiencing last time I saw him. He states not quite as bad as what he had because this was infected previously but nonetheless is still appears to be doing poorly. Fortunately there is no evidence of systemic infection at this point. The patient  tells me that he is not going to be able to afford the Santyl. He is still waiting to hear about the infusion therapy with his dermatologist. Apparently she wants an updated colonoscopy first. 08/10/18 on evaluation today patient appears to be doing better in regard to his left lateral lower extremity ulcer. Fortunately he is showing signs of improvement in this regard he's actually been approved for Remicade infusion's as well although this has not been scheduled as of yet. Fortunately there's no signs of active infection at this time in regard to the wound although he is having some issues with infection of the right lower extremity is been seen as dermatologist for this. Fortunately they are definitely still working with him trying to keep things under control. [name], [name] ([id number]) 09/07/18 on evaluation today patient is actually doing rather well in regard to his left lateral lower extremity ulcer. He notes these actually having some hair grow back on his extremity which is something he has not seen in years. He also tells me that the pain is really not giving them any trouble at this time which is also good news overall she is very pleased with the progress he's using a combination of the mupirocin along with the probate is all mixed. 09/21/18 on evaluation today patient actually appears to be doing fairly well all things considered in regard to his looks from the ulcer. He's been tolerating the dressing changes without complication. Fortunately there's no signs of active infection at this time which is good news he is still on all antibiotics or prevention of the staff infection. He has been on prednisone for time although he states it is gonna contact his dermatologist and see if she put them on a short course due to some irritation that he has going on currently. Fortunately there's no evidence of any overall worsening this is going very slow I think cental would be something that would be  helpful for him although he states that $50 for tube is quite expensive. He therefore is not willing to get that at this point. 10/06/18 on evaluation today patient actually appears to be doing decently well in regard to his left lateral leg ulcer. He's been tolerating the dressing changes without complication. Fortunately there's no signs of active infection at this time. Overall I'm actually rather pleased with the progress he's making although it's slow he doesn't show any signs of infection and he does seem to be making some improvement. I do believe that he may need a switch up and dressings to try to help this to heal more appropriately and quickly. 10/19/18 on evaluation today patient actually appears to be doing better in regard to his left lateral lower extremity ulcer. This is shown signs of having much less Slough buildup at this point due to the fact he has  been using the Santyl. Obviously this is very good news. The overall size of the wound is not dramatically smaller but again the appearance is. 11/02/18 on evaluation today patient actually appears to be doing quite well in regard to his lower [place] ulcer. A lot of the skin around the ulcer is actually somewhat irritating at this point this seems to be more due to the dressing causing irritation from the adhesive that anything else. Fortunately there is no signs of active infection at this time. 11/24/18 on evaluation today patient appears to be doing a little worse in regard to his overall appearance of his lower extremity ulcer. There's more erythema and warmth around the wound unfortunately. He is currently on doxycycline which he has been on for some time. With that being said I'm not sure that seems to be helping with what appears to possibly be an acute cellulitis with regard to his left lower extremity ulcer. No fevers, chills, nausea, or vomiting noted at this time. 12/08/18 on evaluation today patient's wounds actually appears to  be doing significantly better compared to his last evaluation. He has been using Santyl along with alternating tripling about appointment as well as the steroid cream seems to be doing quite well and the wound is showing signs of improvement which is excellent news. Fortunately there's no evidence of infection and in fact his culture came back negative with only normal skin flora noted. 12/21/2018 upon evaluation today patient actually appears to be doing excellent with regard to his ulcer. This is actually the best that I have seen it since have been helping to take care of him. It is both smaller as well as less slough noted on the surface of the wound and seems to be showing signs of good improvement with new skin growing from the edges. He has been using just the triamcinolone he does wonder if he can get a refill of that ointment today. 01/04/2019 upon evaluation today patient actually appears to be doing well with regard to his left lateral lower extremity ulcer. With that being said it does not appear to be that he is doing quite as well as last time as far as progression is concerned. There does not appear to be any signs of infection or significant irritation which is good news. With that being said I do believe that he may benefit from switching to a collagen based dressing based on how clean The wound appears. 01/18/2019 on evaluation today patient actually appears to be doing well with regard to his wound on the left lower extremity. He is not made a lot of progress compared to where we were previous but nonetheless does seem to be doing okay at this time which is good news. There is no signs of active infection which is also good news. My only concern currently is I do wish we can get him into utilizing the collagen dressing his insurance would not pay for the supplies that we ordered although it appears that he may be able to order this through his supply company that he typically utilizes.  This is [employer]. Nonetheless he did try to order it during the office visit today and it appears this did go through. We will see if he can get that it is a different brand but nonetheless he has collagen and I do think will be beneficial. 02/01/2019 on evaluation today patient actually appears to be doing a little worse today in regard to the overall size of his wounds.  Fortunately there is no signs of active infection at this time. That is visually. Nonetheless when this is happened before it was due to infection. For that reason were somewhat concerned about that this time as well. 02/08/2019 on evaluation today patient unfortunately appears to be doing slightly worse with regard to his wound upon evaluation today. Is measuring a little deeper and a little larger unfortunately. I am not really sure exactly what is causing this to enlarge he actually did see his dermatologist she is going to see about initiating Humira for him. Subsequently she also did do steroid injections into the wound itself in the periphery. Nonetheless still nonetheless he seems to be getting a little bit larger he is gone back to just using the steroid cream topically which I think is appropriate. I would say hold off on the collagen for the time being is definitely a good thing to do. Based on the culture results which we finally did get the final result back regarding it shows staph as the bacteria noted again that can be a normal skin bacteria based on the fact however he is having increased drainage and worsening of the wound measurement wise I would go ahead and place him on an antibiotic today I do believe for this. 02/15/2019 on evaluation today patient actually appears to be doing somewhat better in regard to his ulcer. There is no signs of worsening at this time I did review his culture results which showed evidence of Staphylococcus aureus but not MRSA. Again this could just be more related to the normal skin  bacteria although he states the drainage has slowed down quite a bit he may have had a mild infection not just colonization. And was much smaller and then since around10/04/2019 on evaluation today patient appears to be doing unfortunately worse as far as the size of the wound. I really feel like that this is steadily getting larger again it had been doing excellent right at the beginning of September we have seen a steady increase in the area of the wound it is almost 2-1/2 times the size it was on September 1. Obviously this is a bad trend this is not wanting to see. For that reason we went back to using just the topical triamcinolone cream which does seem to help with inflammation. I checked him for bacteria by way of culture and nothing showed positive there. I am considering giving him a short course of a tapering steroid Dosepak today to see if that is can be beneficial for him. The patient is in agreement with giving that a try. 03/08/2019 on evaluation today patient appears to be doing very well in comparison to last evaluation with regard to his lower extremity ulcer. This is showing signs of less inflammation and actually measuring slightly smaller compared to last time every other week over the past month and a half he has been measuring larger larger larger. Nonetheless I do believe that the issue has been inflammation the prednisone does seem to [facility] ([id number]) have been beneficial for him which is good news. No fevers, chills, nausea, vomiting, or diarrhea. 03/22/2019 on evaluation today patient appears to be doing about the same with regard to his leg ulcer. He has been tolerating the dressing changes without complication. With that being said the wound seems to be mostly arrested at its current size but really is not making any progress except for when we prescribed the prednisone. He did show some signs of dropping as  far as the overall size of the wound during that interval  week. Nonetheless this is something he is not on long-term at this point and unfortunately I think he is getting need either this or else the Humira which his dermatologist has discussed try to get approval for. With that being said he will be seeing his dermatologist on the 11th of this month that is November. 04/19/2019 on evaluation today patient appears to be doing really about the same the wound is measuring slightly larger compared to last time I saw him. He has not been into the office since November 2 due to the fact that he unfortunately had Covid as that his entire family. He tells me that it was rough but they did pull-through and he seems to be doing much better. Fortunately there is no signs of active infection at this time. No fevers, chills, nausea, vomiting, or diarrhea. 05/10/2019 on evaluation today patient unfortunately appears to be doing significantly worse as compared to last time I saw him. He does tell me that he has had his first dose of Humira and actually is scheduled to get the next one in the upcoming week. With that being said he tells me also that in the past several days he has been having a lot of issues with green drainage she showed me a picture this is more blue-green in color. He is also been having issues with increased sloughy buildup and the wound does appear to be larger today. Obviously this is not the direction that we want everything to take based on the starting of his Humira. Nonetheless I think this is definitely a result of likely infection and to be honest I think this is probably Pseudomonas causing the infection based on what I am seeing. 05/24/2019 on evaluation today patient unfortunately appears to be doing significantly worse compared to his prior evaluation with me 2 weeks ago. I did review his culture results which showed that he does have Staph aureus as well as Pseudomonas noted on the culture. Nonetheless the Levaquin that I prescribed for him  does not appear to have been appropriate and in fact he tells me he is no longer experiencing the green drainage and discharge that he had at the last visit. Fortunately there is no signs of active infection at this time which is good news although the wound has significantly worsened it in fact is much deeper than it was previous. We have been utilizing up to this point triamcinolone ointment as the prescription topical of choice but at this time I really feel like that the wound is getting need to be packed in order to appropriately manage this due to the deeper nature of the wound. Therefore something along the lines of an alginate dressing may be more appropriate. 05/31/2019 upon inspection today patient's wound actually showed signs of doing poorly at this point. Unfortunately he just does not seem to be making any good progress despite what we have tried. He actually did go ahead and pick up the Cipro and start taking that as he was noticing more green drainage he had previously completed the Levaquin that I prescribed for him as well. Nonetheless he missed his appointment for the seventh last week on Wednesday with the wound care center and [facility] where his dermatologist referred him. Obviously I do think a second opinion would be helpful at this point especially in light of the fact that the patient seems to be doing so poorly despite  the fact that we have tried everything that I really know how at this point. The only thing that ever seems to have helped him in the past is when he was on high doses of continual steroids that did seem to make a difference for him. 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 06/07/2019 upon evaluation today patient unfortunately appears to be doing worse yet again with regard to his wound. In fact I  am starting to question whether or not he may have a fluid pocket in the muscle at this point based on the bulging and the soft appearance to the central portion of the muscle area. There is not anything draining from the muscle itself at this time which is good news but nonetheless the wound is expanding. I am not really seeing any results of the Humira as far as overall wound progression based on what I am seeing at this point. The patient has been referred for second opinion with regard to his wound to the [facility] wound care center by his dermatologist which I definitely am not in opposition to. Unfortunately we tried multiple dressings in the past including collagen, alginate, and at one point even Hydrofera Blue. With that being said he is never really used it for any significant amount of time due to the fact that he often complains of pain associated with these dressings and then will go back to either using the Santyl which she has done intermittently or more frequently the triamcinolone. He is also using his own compression stockings. We have wrapped him in the past but again that was something else that he really was not a big fan of. 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 He seemed to do much better when he was on the steroids although honestly I understand that the steroids are not necessarily the best medication to be on long-term obviously 06/14/2019 on evaluation today patient appears to be doing actually a little bit better with regard to the overall appearance with his leg. Unfortunately he does continue to have issues with what appears to be some fluid underneath  the muscle although he did see the wound specialty center at [facility] last week their main goals were to see about infusion therapy in place of the Humira as they feel like that is not quite strong enough. They also recommended that we continue with the treatment otherwise as we are they felt like that was appropriate and they are okay with him continuing to follow-up here with [place] in that regard. With that being said they are also sending him to the vein specialist there to see about vein stripping and if that would be of benefit for him. Subsequently they also did not really address whether or not an ultrasound of the muscle area to see if there is anything that needs to be addressed here would be appropriate or not. For that reason I discussed this with him last week I  think we may proceed down that road at this point. 06/21/2019 upon evaluation today patient's wound actually appears to be doing slightly better compared to previous evaluations. I do believe that he has made a difference with regard to the progression here with the use of oral steroids. Again in the past has been the only thing that is really calm things down. He does tell me that from [facility] is gotten a good news from there that there are no further vein stripping that is necessary at this point. I do not have that available for review today although the patient did relay this to me. He also did obtain and have the ultrasound of the wound completed which I did sign off on today. It does appear that there is no fluid collection under the muscle this is likely then just edematous tissue in general. That is also good news. Overall I still believe the inflammation is the main issue here. He did inquire about the possibility of a wound VAC again with the muscle protruding like it is I am not really sure whether the wound VAC is necessarily ideal or not. That is something we will have to consider although I do believe he may need compression  wrapping to try to help with edema control which could potentially be of benefit. 06/28/2019 on evaluation today patient appears to be doing slightly better measurement wise although this is not terribly smaller he least seems to be trending towards that direction. With that being said he still seems to have purulent drainage noted in the wound bed at this time. He has been on Levaquin followed by Cipro over the past month. Unfortunately he still seems to have some issues with active infection at this time. I did perform a culture last week in order to evaluate and see if indeed there was still anything going on. Subsequently the culture did come back showing Pseudomonas which is consistent with the drainage has been having which is blue-green in color. He also has had an odor that again was somewhat consistent with Pseudomonas as well. Long story short it appears that the culture showed an intermediate finding with regard to how well the Cipro will work for the Pseudomonas infection. Subsequently being that he does not seem to be clearing up and at best what we are doing is just keeping this at [place] I think he may need to see infectious disease to discuss IV antibiotic options. ABDO, DENAULT ([id number]) 07/05/2019 upon evaluation today patient appears to be doing okay in regard to his leg ulcer. He has been tolerating the dressing changes at this point without complication. Fortunately there is no signs of active infection at this time which is good news. No fevers, chills, nausea, vomiting, or diarrhea. With that being said he does have an appointment with infectious disease tomorrow and his primary care on Wednesday. Again the reason for the infectious disease referral was due to the fact that he did not seem to be fully resolving with the use of oral antibiotics and therefore we were thinking that IV antibiotic therapy may be necessary secondary to the fact that there was an intermediate finding for  how effective the Cipro may be. Nonetheless again he has been having a lot of purulent and even green drainage. Fortunately right now that seems to have calmed down over the past week with the reinitiation of the oral antibiotic. Nonetheless we will see what Dr. Megan Salon has to say. 07/12/2019 upon evaluation today patient appears  to be doing about the same at this point in regard to his left lower extremity ulcer. Fortunately there is no signs of active infection at this time which is good news I do believe the Levaquin has been beneficial I did review Dr. Hale Bogus note and to be honest I agree that the patient's leg does appear to be doing better currently. What we found in the past as he does not seem to really completely resolve he will stop the antibiotic and then subsequently things will revert back to having issues with blue-green drainage, increased pain, and overall worsening in general. Obviously that is the reason I sent him back to infectious disease. 07/19/2019 upon evaluation today patient appears to be doing roughly the same in size there is really no dramatic improvement. He has started back on the Levaquin at this point and though he seems to be doing okay he did still have a lot of blue/green drainage noted on evaluation today unfortunately. I think that this is still indicative more likely of a Pseudomonas infection as previously noted and again he does see Dr. Megan Salon in just a couple of days. I do not know that were really able to effectively clear this with just oral antibiotics alone based on what I am seeing currently. Nonetheless we are still continue to try to manage as best we can with regard to the patient and his wound. I do think the wrap was helpful in decreasing the edema which is excellent news. No fevers, chills, nausea, vomiting, or diarrhea. 07/26/2019 upon evaluation today patient appears to be doing slightly better with regard to the overall appearance of the muscle  there is no dark discoloration centrally. Fortunately there is no signs of active infection at this time. No fevers, chills, nausea, vomiting, or diarrhea. Patient's wound bed currently the patient did have an appointment with Dr. Megan Salon at infectious disease last week. With that being said Dr. Megan Salon the patient states was still somewhat hesitant about put him on any IV antibiotics he wanted [place] to repeat cultures today and then see where things go going forward. He does look like Dr. Megan Salon because of some improvement the patient did have with the Levaquin wanted [place] to see about repeating cultures. If it indeed grows the Pseudomonas again then he recommended a possibility of considering a PICC line placement and IV antibiotic therapy. He plans to see the patient back in 1 to 2 weeks. 08/02/2019 upon evaluation today patient appears to be doing poorly with regard to his left lower extremity. We did get the results of his culture back it shows that he is still showing evidence of Pseudomonas which is consistent with the purulent/blue-green drainage that he has currently. Subsequently the culture also shows that he now is showing resistance to the oral fluoroquinolones which is unfortunate as that was really the only thing to treat the infection prior. I do believe that he is looking like this is going require IV antibiotic therapy to get this under control. Fortunately there is no signs of systemic infection at this time which is good news. The patient does see Dr. Megan Salon tomorrow. 08/09/2019 upon evaluation today patient appears to be doing better with regard to his left lower extremity ulcer in regard to the overall appearance. He is currently on IV antibiotic therapy. As ordered by Dr. Megan Salon. Currently the patient is on ceftazidime which she is going to take for the next 2 weeks and then follow-up for 4 to 5-week appointment with  Dr. Megan Salon. The patient started this this past Friday  symptoms have not for a total of 3 days currently in full. 08/16/2019 upon evaluation today patient's wound actually does show muscle in the base of the wound but in general does appear to be much better as far as the overall evidence of infection is concerned. In fact I feel like this is for the most part cleared up he still on the IV antibiotics he has not completed the full course yet but I think he is doing much better which is excellent news. 08/23/2019 upon evaluation today patient appears to be doing about the same with regard to his wound at this point. He tells me that he still has pain unfortunately. Fortunately there is no evidence of systemic infection at this time which is great news. There is significant muscle protrusion. 09/13/19 upon evaluation today patient appears to be doing about the same in regard to his leg unfortunately. He still has a lot of drainage coming from the ulceration there is still muscle exposed. With that being said the patient's last wound culture still showed an intermediate finding with regard to the Pseudomonas he still having the bluish/green drainage as well. Overall I do not know that the wound has completely cleared of infection at this point. Fortunately there is no signs of active infection systemically at this point which is good news. 09/20/2019 upon evaluation today patient's wound actually appears to be doing about the same based on what I am seeing currently. I do not see any signs of systemic infection he still does have evidence of some local infection and drainage. He did see Dr. Megan Salon last week and Dr. Megan Salon states that he probably does need a different IV antibiotic although he does not want to put him on this until the patient begins the Remicade infusion which is actually scheduled for about 10 days out from today on 13 May. Following that time Dr. Megan Salon is good to see him back and then will evaluate the feasibility of starting him on the  IV antibiotic therapy once again at that point. I do not disagree with this plan I do believe as Dr. Megan Salon stated in his note that I reviewed today that the patient's issue is multifactorial with the pyoderma being 1 aspect of this that were hoping the Remicade will be helpful for her. In the meantime I think the gentamicin is, helping to keep things under decent okay control in regard to the ulcer. 09/27/2019 upon evaluation today patient appears to be doing about the same with regard to his wound still there is a lot of muscle exposure though he does have some hyper granulation tissue noted around the edge and actually some granulation tissue starting to form over the muscle which is actually good news. Fortunately there is no evidence of active infection which is also good news. His pain is less at this point. 5/21; this is a patient I have not seen in a long time. 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 He has a large wound on the left lateral leg with protruding muscle. He comes in the clinic today showing the same area on his left medial ankle. He says there is been a spot there for some time although we have not previously defined this. Today he has a clearly defined area with slight amount of skin breakdown surrounded by raised areas with a purplish hue in color. This is not painful he says it  is irritated. 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 He still has muscle protruding from the lateral portion of his left leg but fortunately the new area that they were concerned about at his last visit does not appear to have opened at this point. He is currently on Remicade infusions and seems to be doing better in my opinion in fact the wound itself seems to be overall much better. The purplish discoloration that he did have seems to have resolved  and I think that is a good sign that hopefully the Remicade is doing its job. He does have some biofilm noted over the surface of the wound. Electronic Signature(s) [name], [name] ([id number]) Signed: 10/25/2019 8:59:30 AM By: Worthy Keeler PA-C Entered By: Worthy Keeler on 10/25/2019 08:59:30 RIELY, BASKETT ([id number]) -------------------------------------------------------------------------------- Physical Exam Details Patient Name: [name] Date of Service: 10/25/2019 8:15 AM Medical Record Number: [id number] Patient Account Number: [id number] Date of Birth/Sex: 21-Dec-1980 (39 y.o. M) Treating RN: Army Melia Primary Care Provider: [name] Other Clinician: Referring Provider: [name] Treating Provider/Extender: Melburn Hake, Lemuel Boodram Weeks in Treatment: 150 Constitutional Obese and well-hydrated in no acute distress. Respiratory normal breathing without difficulty. Psychiatric this patient is able to make decisions and demonstrates good insight into disease process. Alert and Oriented x 3. pleasant and cooperative. Notes Upon inspection patient's wound bed actually did show biofilm over the surface of the wound which was typical for what we are seeing recently. With that being said I was actually able to mechanically debride this away today which the patient tolerated with saline and gauze he only had one time where it was so painful that he jumped the rest of the time he was able to manage. This is the first time we have been able to do this in months. With that being said once I was able to clear off most of the biofilm and slough from the surface of the wound the patient actually seems to be doing better with new granulation buds noted throughout the muscle area which is excellent news I am very pleased with this. Electronic Signature(s) Signed: 10/25/2019 9:00:26 AM By: Worthy Keeler PA-C Entered By: Worthy Keeler on 10/25/2019 09:00:25 [name], [name]  ([id number]) -------------------------------------------------------------------------------- Physician Orders Details Patient Name: [name] Date of Service: 10/25/2019 8:15 AM Medical Record Number: [id number] Patient Account Number: [id number] Date of Birth/Sex: [birth year] (39 y.o. M) Treating RN: Army Melia Primary Care Provider: [name] Other Clinician: Referring Provider: [name] Treating Provider/Extender: Melburn Hake, [name] Weeks in Treatment: 150 Verbal / Phone Orders: No Diagnosis Coding ICD-10 Coding Code Description [phone] Non-pressure chronic ulcer of left calf with fat layer exposed L88 Pyoderma gangrenosum L97.321 Non-pressure chronic ulcer of left ankle limited to breakdown of skin I87.2 Venous insufficiency (chronic) (peripheral) L03.116 Cellulitis of left lower limb Wound Cleansing Wound #1 Left,Lateral Lower Leg o Clean wound with Normal Saline. Wound #3 Left,Medial Ankle o Clean wound with Normal Saline. Anesthetic (add to Medication List) Wound #1 Left,Lateral Lower Leg o Topical Lidocaine 4% cream applied to wound bed prior to debridement (In Clinic Only). Wound #3 Left,Medial Ankle o Topical Lidocaine 4% cream applied to wound bed prior to debridement (In Clinic Only). Primary Wound Dressing o Gentamicin Sulfate Cream Wound #3 Left,Medial Ankle o Other: - clobetasol (TCA in clinic) Secondary Dressing Wound #1 Left,Lateral Lower Leg o Boardered Foam Dressing Wound #3 Left,Medial Ankle o Boardered  Foam Dressing Dressing Change Frequency Wound #1 Left,Lateral Lower Leg o Change dressing every day. Wound #3 Left,Medial Ankle o Change dressing every day. Follow-up Appointments Wound #1 Left,Lateral Lower Leg o Return Appointment in 1 week. Wound #3 Left,Medial Ankle o Return Appointment in 1 week. Edema Control Wound #1 Left,Lateral Lower Leg o Patient to wear own compression stockings [name], [name]  ([id number]) o Elevate legs to the level of the heart and pump ankles as often as possible Wound #3 Left,Medial Ankle o Patient to wear own compression stockings o Elevate legs to the level of the heart and pump ankles as often as possible Electronic Signature(s) Signed: 10/25/2019 12:56:57 PM By: Army Melia Signed: 10/25/2019 4:20:46 PM By: Worthy Keeler PA-C Entered By: Army Melia on 10/25/2019 08:40:38 [name] ([id number]) -------------------------------------------------------------------------------- Problem List Details Patient Name: [name] Date of Service: 10/25/2019 8:15 AM Medical Record Number: [id number] Patient Account Number: [id number] Date of Birth/Sex: 1981/04/15 (39 y.o. M) Treating RN: Army Melia Primary Care Provider: [name] Other Clinician: Referring Provider: [name] Treating Provider/Extender: Melburn Hake, Annayah Worthley Weeks in Treatment: 150 Active Problems ICD-10 Encounter Code Description Active Date MDM Diagnosis L97.222 Non-pressure chronic ulcer of left calf with fat layer exposed 12/04/2016 No Yes L88 Pyoderma gangrenosum 03/26/2017 No Yes L97.321 Non-pressure chronic ulcer of left ankle limited to breakdown of skin 10/08/2019 No Yes I87.2 Venous insufficiency (chronic) (peripheral) 12/04/2016 No Yes L03.116 Cellulitis of left lower limb 05/24/2019 No Yes Inactive Problems ICD-10 Code Description Active Date Inactive Date L97.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 M) Treating RN: Army Melia Primary Care Provider: [name] Other Clinician: Referring Provider: [name] Treating Provider/Extender: Melburn Hake, Callaghan Laverdure Weeks in Treatment: 150 Subjective Chief Complaint Information obtained from Patient He is here in follow up evaluation for LLE pyoderma ulcer History of Present Illness (HPI) 12/04/16; 39 year old man who comes into the clinic today for review of a wound on the posterior left calf. He tells me that is been there for about a year. He is not a diabetic he does smoke half a pack per day. He was seen in the ER on 11/20/16 felt to have cellulitis around the wound and was given clindamycin. An x-ray did not show osteomyelitis. The patient initially tells me that he has a milk allergy that sets off a pruritic itching rash on his lower legs which she scratches incessantly and he thinks that's what may have set up the wound. He has been using various topical antibiotics and ointments without any effect. He works in a [employer] and is on his feet all day. He does not have a prior history of wounds however he does have the rash on both lower legs the right arm and the ventral aspect of his left arm. These are excoriations and clearly have had scratching however there are of macular looking areas on both legs including a substantial larger area on the right leg. This does not have an underlying open area. There is no blistering. The patient tells me that 2 years ago in [place] in response to the rash on his legs he saw a dermatologist who told him he had  a condition which may be pyoderma gangrenosum although I may be putting words into his mouth. He seemed to recognize this. 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 He is not in any treatment for this. He's had no recent travel 12/11/16; the patient arrives today with his wound and roughly the same condition we've been  using silver alginate this is a deep punched out wound with some surrounding erythema but no tenderness. Biopsy I did did not show confirmed pyoderma gangrenosum suggested nonspecific inflammation and vasculitis but does not provide an actual description of what was seen by the pathologist. I'm really not able to understand this We have also received information from the patient's dermatologist in [place] notes from April 2016. This was a doctor Agarwal-antal. 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 He was prescribed topical steroid high potency under occlusion which helped but at this point the patient did not have a deep punched out wound. 12/18/16; the patient's wound is larger in terms of surface area however this surface looks better and there is less depth. The surrounding erythema also is better. The patient states that the wrap we put on came off 2 days ago when he has been using his compression stockings. He we are in the process of getting a dermatology consult. 12/26/16 on evaluation today patient's left lower extremity wound shows evidence of infection with surrounding erythema noted. He has been tolerating the dressing changes but states that he has noted more discomfort. There is a larger area of erythema surrounding the wound. No fevers, chills, nausea, or vomiting noted at this time. With that being said the wound still does have slough covering the surface. He is not allergic to any medication that he is aware of at this point. In regard to his right lower extremity he had several regions that are erythematous and pruritic he wonders if there's anything we can do to help that. 01/02/17 I reviewed patient's wound culture which was obtained his visit last week. He was placed on doxycycline at that point. Unfortunately that does not appear to be an antibiotic that would likely help with the situation however the pseudomonas noted on culture is sensitive to Cipro.  Also unfortunately patient's wound seems to have a large compared to last week's evaluation. Not severely so but there are definitely increased measurements in general. He is continuing to have discomfort as well he writes this to be a seven out of 10. In fact he would prefer me not to perform any debridement today due to the fact that he is having discomfort and considering he has an active infection on the little reluctant to do so anyway. No fevers, chills, nausea, or vomiting noted at this time. 01/08/17; patient seems dermatology on September 5. I suspect dermatology will want the slides from the biopsy I did sent to their pathologist. I'm not sure if there is a way we can expedite that. In any case the culture I did before I left on vacation 3 weeks ago showed Pseudomonas he was given 10 days of Cipro and per her description of her intake nurses is actually somewhat better this week although the wound is quite a bit bigger than I remember the last time I saw this. He still has 3 more days of Cipro 01/21/17; dermatology appointment tomorrow. He has completed the ciprofloxacin for Pseudomonas. Surface of the wound looks better however he is had some deterioration in the lesions on his  right leg. Meantime the left lateral leg wound we will continue with sample 01/29/17; patient had his dermatology appointment but I can't yet see that note. He is completed his antibiotics. The wound is more superficial but considerably larger in circumferential area than when he came in. This is in his left lateral calf. He also has swollen erythematous areas with superficial wounds on the right leg and small papular areas on both arms. There apparently areas in her his upper thighs and buttocks I did not look at those. Dermatology biopsied the right leg. Hopefully will have their input next week. 02/05/17; patient went back to see his dermatologist who told him that he had a "scratching problem" as well as staph. He is  now on a 30 day course of doxycycline and I believe she gave him triamcinolone cream to the right leg areas to help with the itching [not exactly sure but probably triamcinolone]. She apparently looked at the left lateral leg wound although this was not rebiopsied and I think felt to be ultimately part of the same pathogenesis. He is using sample border foam and changing nevus himself. He now has a new open area on the right posterior leg which was his biopsy site I don't have any of the dermatology notes 02/12/17; we put the patient in compression last week with SANTYL to the wound on the left leg and the biopsy. Edema is much better and the depth of the wound is now at level of skin. Area is still the same Biopsy site on the right lateral leg we've also been using santyl with a border foam dressing and he is changing this himself. 02/19/17; Using silver alginate started last week to both the substantial left leg wound and the biopsy site on the right wound. He is tolerating compression well. Has a an appointment with his primary M.D. tomorrow wondering about diuretics although I'm wondering if the edema problem is actually lymphedema [name], MCSWEENEY ([id number]) 02/26/17; the patient has been to see his primary doctor Dr. Jerrel Ivory at [facility] our primary care. She started him on Lasix 20 mg and this seems to have helped with the edema. However we are not making substantial change with the left lateral calf wound and inflammation. The biopsy site on the right leg also looks stable but not really all that different. 03/12/17; the patient has been to see vein and vascular Dr. Lucky Cowboy. He has had venous reflux studies I have not reviewed these. I did get a call from his dermatology office. They felt that he might have pathergy based on their biopsy on his right leg which led them to look at the slides of the biopsy I did on the left leg and they wonder whether this represents pyoderma gangrenosum which  was the original supposition in a man with ulcerative colitis albeit inactive for many years. They therefore recommended clobetasol and tetracycline i.e. 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 03/26/17; apparently the patient just had reflux studies not an appointment with Dr. dew. She arrives in clinic today having applied clobetasol for 2-3 weeks. He notes over the last 2-3 days excessive drainage having to change the dressing 3-4 times a day and also expanding erythema. He states the expanding erythema seems to come and go and was last this red was earlier in the month.he is on doxycycline 150 mg twice a day as an anti-inflammatory systemic therapy for possible pyoderma gangrenosum along with the topical clobetasol 04/02/17; the patient was seen last week by  Dr. Lillia Carmel at [facility] dermatology locally who kindly saw him at my request. 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 My concern was the degree of erythema medially extending from his left leg wound which was either inflammation from pyoderma or cellulitis. I put him on Augmentin however culture of the wound showed Pseudomonas which is quinolone sensitive. I really don't believe he has cellulitis however in view of everything I will continue and give him a course of Cipro. He is also on doxycycline as an immune modulator for the pyoderma. In addition to his original wound on the left lateral leg with surrounding erythema he has a wound on the right posterior calf which was an original biopsy site done by dermatology. 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 Saw Dr. Lillia Carmel yesterday. He has been using topical antibiotics to both wound areas his original wound on the left and the biopsies/pathergy area on the right. There is definitely some improvement in the inflammation around the wound on the  right although the patient states he has increasing sensitivity of the wounds. He is on prednisone 60 and doxycycline 1 as prescribed by Dr. Lillia Carmel. He is covering the topical antibiotic with gauze and putting this in his own compression stocks and changing this daily. He states that Dr. Lottie Rater did a culture of the left leg wound yesterday 05/07/17; pyoderma gangrenosum. The patient saw Dr. Lillia Carmel yesterday and has a follow-up with her in one month. He is still using topical antibiotics to both wounds although he can't recall exactly what type. He is still on prednisone 60 mg. Dr. Lillia Carmel stated that the doxycycline could stop if we were in agreement. 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 The right medial leg was induced by biopsy/pathergy. The area on the right is essentially healed. 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 The right medial leg has closed and remains closed. He is still on prednisone 60. He tells me he missed his last dermatology appointment with Dr. Lillia Carmel but will make another appointment. He reports that her blood sugar at a recent screen in [place] was high 200's. He was 180 today. 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 I think he is going to require still much longer prednisone perhaps another 3 months before attempting to taper. In the meantime his wound is a lot better. Smaller. He is cleaning this off daily and applying topical antibiotics. When he was last in the clinic I thought about changing to [facility] and actually put in a couple of calls to dermatology although probably not during their business hours. In any case the wound looks better smaller I don't think there is any need to change what he is doing 08/06/17-he is here in follow up  evaluation for pyoderma left leg ulcer. He continues on oral prednisone. He has been using triple antibiotic ointment. There is surface debris and we will transition to [facility] and have him return in 2 weeks. He has lost 30 pounds since his last appointment with lifestyle modification. He may benefit from topical steroid cream for treatment this can be considered at a later date. 08/22/17 on evaluation today patient appears to actually be doing rather well in regard to his left lateral lower  extremity ulcer. He has actually been managed by Dr. Dellia Nims most recently. Patient is currently on oral steroids at this time. This seems to have been of benefit for him. Nonetheless his last visit was actually with Leah on 08/06/17. Currently he is not utilizing any topical steroid creams although this could be of benefit as well. No fevers, chills, nausea, or vomiting noted at this time. 09/05/17 on evaluation today patient appears to be doing better in regard to his left lateral lower extremity ulcer. He has been tolerating the dressing changes without complication. He is using Santyl with good effect. Overall I'm very pleased with how things are standing at this point. Patient likewise is happy that this is doing better. 09/19/17 on evaluation today patient actually appears to be doing rather well in regard to his left lateral lower extremity ulcer. 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 He's not having any significant pain. 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 He has been tolerating the Santyl without complication and in general I feel like he's making good progress. 10/17/17 on evaluation today patient appears to be doing very well in regard to his left lateral lower surety ulcer. He has been tolerating the dressing  changes without complication. There does not appear to be any evidence of infection he's alternating the Santyl and the triple antibiotic ointment every other day this seems to be doing well for him. 11/03/17 on evaluation today patient appears to be doing very well in regard to his left lateral lower extremity ulcer. He is been tolerating the dressing changes without complication which is good news. Fortunately there does not appear to be any evidence of infection which is also great news. Overall is doing excellent they are starting to taper down on the prednisone is down to 40 mg at this point it also started topical clobetasol for him. 11/17/17 on evaluation today patient appears to be doing well in regard to his left lateral lower surety ulcer. He's been tolerating the dressing changes without complication. He does note that he is having no pain, no excessive drainage or discharge, and overall he feels like things are going about how he would expect and hope they would. Overall he seems to have no evidence of infection at this time in my opinion which is good news. 12/04/17-He is seen in follow-up evaluation for right lateral lower extremity ulcer. He has been applying topical steroid cream. Today's measurement show slight increase in size. Over the next 2 weeks we will transition to every other day Santyl and steroid cream. He has been encouraged to monitor for changes and notify clinic with any concerns 12/15/17 on evaluation today patient's left lateral motion the ulcer and fortunately is doing worse again at this point. This just since last week to this week has close to doubled in size according to the patient. I did not seeing last week's I do not have a visual to compare this to in our system was also down so we do not have all the charts and at this point. Nonetheless it does have me somewhat concerned in regard to the fact that again he was worried enough about it he has contact the  dermatology that placed them back on the full strength, 50 mg a day of the prednisone that he was taken previous.  He continues to alternate using clobetasol along with Santyl at this point. He is obviously somewhat frustrated. [name], [name] ([id number]) 12/22/17 on evaluation today patient appears to be doing a little worse compared to last evaluation. Unfortunately the wound is a little deeper and slightly larger than the last week's evaluation. With that being said he has made some progress in regard to the irritation surrounding at this time unfortunately despite that progress that's been made he still has a significant issue going on here. 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 For that reason I discussed with him today the possibility of perform a wound culture to ensure there's nothing overtly infected. 01/06/18 on evaluation today patient's wound is larger and deeper than previously evaluated. With that being said it did appear that his wound was infected after my last evaluation with him. Subsequently I did end up prescribing a prescription for Bactrim DS which she has been taking and having no complication with. Fortunately there does not appear to be any evidence of infection at this point in time as far as anything spreading, no want to touch, and overall I feel like things are showing signs of improvement. 01/13/18 on evaluation today patient appears to be even a little larger and deeper than last time. There still muscle exposed in the base of the wound. Nonetheless he does appear to be less erythematous I do believe inflammation is calming down also believe the infection looks like it's probably resolved at this time based on what I'm seeing. No fevers, chills, nausea, or vomiting noted at this time. 01/30/18 on evaluation today patient  actually appears to visually look better for the most part. Unfortunately those visually this looks better he does seem to potentially have what may be an abscess in the muscle that has been noted in the central portion of the wound. This is the first time that I have noted what appears to be fluctuance in the central portion of the muscle. With that being said I'm somewhat more concerned about the fact that this might indicate an abscess formation at this location. I do believe that an ultrasound would be appropriate. This is likely something we need to try to do as soon as possible. He has been switch to mupirocin ointment and he is no longer using the steroid ointment as prescribed by dermatology he sees them again next week he's been decreased from 60 to 40 mg of prednisone. 03/09/18 on evaluation today patient actually appears to be doing a little better compared to last time I saw him. There's not as much erythema surrounding the wound itself. He I did review his most recent infectious disease note which was dated 02/24/18. He saw Dr. Michel Bickers in [place]. 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 Patient is now off of antibiotics and they are continually observing this. There seems to be no change in the past two weeks in my pinion based on what the patient says and what I see today compared to what Dr. Megan Salon likely saw two weeks ago. No fevers, chills, nausea, or vomiting noted at this time. 03/23/18 on evaluation today patient's wound actually appears to be showing signs of improvement which is good news. He is currently still on the Dapsone. He is  also working on tapering the prednisone to get off of this and Dr. Lottie Rater is working with him in this regard. Nonetheless overall I feel like the wound is doing well it does appear based on the infectious disease note that I reviewed from Dr. 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 I did also review the results of his ultrasound of left lower extremity which revealed there was a dentist tissue in the base of the wound without an abscess noted. 04/06/18 on evaluation today the patient's left lateral lower extremity ulcer actually appears to be doing fairly well which is excellent news. There does not appear to be any evidence of infection at this time which is also great news. Overall he still does have a significantly large ulceration although little by little he seems to be making progress. He is down to 10 mg a day of the prednisone. 04/20/18 on evaluation today patient actually appears to be doing excellent at this time in regard to his left lower extremity ulcer. He's making signs of good progress unfortunately this is taking much longer than we would really like to see but nonetheless he is making progress. Fortunately there does not appear to be any evidence of infection at this time. No fevers, chills, nausea, or vomiting noted at this time. The patient has not been using the Santyl due to the cost he hadn't got in this field yet. He's mainly been using the antibiotic ointment topically. Subsequently he also tells me that he really has not been scrubbing in the shower I think this would be helpful again as I told him it doesn't have to be anything too aggressive to even make it believe just enough to keep it free of some of the loose slough and biofilm on the wound surface. 05/11/18 on evaluation today patient's wound appears to be making slow but sure progress in regard to the left lateral lower extremity ulcer. He is been tolerating the dressing changes without complication. Fortunately there does not appear to be any evidence of infection at this time. He is still just using triple antibiotic ointment along with clobetasol  occasionally over the area. He never got the Santyl and really does not seem to intend to in my pinion. 06/01/18 on evaluation today patient appears to be doing a little better in regard to his left lateral lower extremity ulcer. He states that overall he does not feel like he is doing as well with the Dapsone as he did with the prednisone. Nonetheless he sees his dermatologist later today and is gonna talk to them about the possibility of going back on the prednisone. Overall again I believe that the wound would be better if you would utilize Santyl but he really does not seem to be interested in going back to the [place] at this point. He has been using triple antibiotic ointment. 06/15/18 on evaluation today patient's wound actually appears to be doing about the same at this point. Fortunately there is no signs of infection at this time. He has made slight improvements although he continues to not really want to clean the wound bed at this point. He states that he just doesn't mess with it he doesn't want to cause any problems with everything else he has going on. He has been on medication, antibiotics as prescribed by his dermatologist, for a staff infection of his  lower extremities which is really drying out now and looking much better he tells me. Fortunately there is no sign of overall infection. 06/29/18 on evaluation today patient appears to be doing well in regard to his left lateral lower surety ulcer all things considering. Fortunately his staff infection seems to be greatly improved compared to previous. He has no signs of infection and this is drying up quite nicely. He is still the doxycycline for this is no longer on cental, Dapsone, or any of the other medications. His dermatologist has recommended possibility of an infusion but right now he does not want to proceed with that. 07/13/18 on evaluation today patient appears to be doing about the same in regard to his left lateral lower surety  ulcer. Fortunately there's no signs of infection at this time which is great news. Unfortunately he still builds up a significant amount of Slough/biofilm of the surface of the wound he still is not really cleaning this as he should be appropriately. Again I'm able to easily with saline and gauze remove the majority of this on the surface which if you would do this at home would likely be a dramatic improvement for him as far as getting the area to improve. Nonetheless overall I still feel like he is making progress is just very slow. I think Santyl will be of benefit for him as well. Still he has not gotten this as of this point. 07/27/18 on evaluation today patient actually appears to be doing little worse in regards of the erythema around the periwound region of the wound he also tells me that he's been having more drainage currently compared to what he was experiencing last time I saw him. He states not quite as bad as what he had because this was infected previously but nonetheless is still appears to be doing poorly. Fortunately there is no evidence of systemic infection at this point. The patient tells me that he is not going to be able to afford the Santyl. He is still waiting to hear about the infusion therapy with his dermatologist. Apparently she wants an updated colonoscopy first. [name], [name] ([id number]) 08/10/18 on evaluation today patient appears to be doing better in regard to his left lateral lower extremity ulcer. Fortunately he is showing signs of improvement in this regard he's actually been approved for Remicade infusion's as well although this has not been scheduled as of yet. Fortunately there's no signs of active infection at this time in regard to the wound although he is having some issues with infection of the right lower extremity is been seen as dermatologist for this. Fortunately they are definitely still working with him trying to keep things under control. 09/07/18 on  evaluation today patient is actually doing rather well in regard to his left lateral lower extremity ulcer. He notes these actually having some hair grow back on his extremity which is something he has not seen in years. He also tells me that the pain is really not giving them any trouble at this time which is also good news overall she is very pleased with the progress he's using a combination of the mupirocin along with the probate is all mixed. 09/21/18 on evaluation today patient actually appears to be doing fairly well all things considered in regard to his looks from the ulcer. He's been tolerating the dressing changes without complication. Fortunately there's no signs of active infection at this time which is good news he is still on all antibiotics or  prevention of the staff infection. He has been on prednisone for time although he states it is gonna contact his dermatologist and see if she put them on a short course due to some irritation that he has going on currently. Fortunately there's no evidence of any overall worsening this is going very slow I think cental would be something that would be helpful for him although he states that $50 for tube is quite expensive. He therefore is not willing to get that at this point. 10/06/18 on evaluation today patient actually appears to be doing decently well in regard to his left lateral leg ulcer. He's been tolerating the dressing changes without complication. Fortunately there's no signs of active infection at this time. Overall I'm actually rather pleased with the progress he's making although it's slow he doesn't show any signs of infection and he does seem to be making some improvement. I do believe that he may need a switch up and dressings to try to help this to heal more appropriately and quickly. 10/19/18 on evaluation today patient actually appears to be doing better in regard to his left lateral lower extremity ulcer. This is shown signs  of having much less Slough buildup at this point due to the fact he has been using the [employer]. Obviously this is very good news. The overall size of the wound is not dramatically smaller but again the appearance is. 11/02/18 on evaluation today patient actually appears to be doing quite well in regard to his lower [place] ulcer. A lot of the skin around the ulcer is actually somewhat irritating at this point this seems to be more due to the dressing causing irritation from the adhesive that anything else. Fortunately there is no signs of active infection at this time. 11/24/18 on evaluation today patient appears to be doing a little worse in regard to his overall appearance of his lower extremity ulcer. There's more erythema and warmth around the wound unfortunately. He is currently on doxycycline which he has been on for some time. With that being said I'm not sure that seems to be helping with what appears to possibly be an acute cellulitis with regard to his left lower extremity ulcer. No fevers, chills, nausea, or vomiting noted at this time. 12/08/18 on evaluation today patient's wounds actually appears to be doing significantly better compared to his last evaluation. He has been using Santyl along with alternating tripling about appointment as well as the steroid cream seems to be doing quite well and the wound is showing signs of improvement which is excellent news. Fortunately there's no evidence of infection and in fact his culture came back negative with only normal skin flora noted. 12/21/2018 upon evaluation today patient actually appears to be doing excellent with regard to his ulcer. This is actually the best that I have seen it since have been helping to take care of him. It is both smaller as well as less slough noted on the surface of the wound and seems to be showing signs of good improvement with new skin growing from the edges. He has been using just the triamcinolone he does wonder if  he can get a refill of that ointment today. 01/04/2019 upon evaluation today patient actually appears to be doing well with regard to his left lateral lower extremity ulcer. With that being said it does not appear to be that he is doing quite as well as last time as far as progression is concerned. There does not appear to  be any signs of infection or significant irritation which is good news. With that being said I do believe that he may benefit from switching to a collagen based dressing based on how clean The wound appears. 01/18/2019 on evaluation today patient actually appears to be doing well with regard to his wound on the left lower extremity. He is not made a lot of progress compared to where we were previous but nonetheless does seem to be doing okay at this time which is good news. There is no signs of active infection which is also good news. My only concern currently is I do wish we can get him into utilizing the collagen dressing his insurance would not pay for the supplies that we ordered although it appears that he may be able to order this through his supply company that he typically utilizes. This is [employer]. Nonetheless he did try to order it during the office visit today and it appears this did go through. We will see if he can get that it is a different brand but nonetheless he has collagen and I do think will be beneficial. 02/01/2019 on evaluation today patient actually appears to be doing a little worse today in regard to the overall size of his wounds. Fortunately there is no signs of active infection at this time. That is visually. Nonetheless when this is happened before it was due to infection. For that reason were somewhat concerned about that this time as well. 02/08/2019 on evaluation today patient unfortunately appears to be doing slightly worse with regard to his wound upon evaluation today. Is measuring a little deeper and a little larger unfortunately. I am not really  sure exactly what is causing this to enlarge he actually did see his dermatologist she is going to see about initiating Humira for him. Subsequently she also did do steroid injections into the wound itself in the periphery. Nonetheless still nonetheless he seems to be getting a little bit larger he is gone back to just using the steroid cream topically which I think is appropriate. I would say hold off on the collagen for the time being is definitely a good thing to do. Based on the culture results which we finally did get the final result back regarding it shows staph as the bacteria noted again that can be a normal skin bacteria based on the fact however he is having increased drainage and worsening of the wound measurement wise I would go ahead and place him on an antibiotic today I do believe for this. 02/15/2019 on evaluation today patient actually appears to be doing somewhat better in regard to his ulcer. There is no signs of worsening at this time I did review his culture results which showed evidence of Staphylococcus aureus but not MRSA. Again this could just be more related to the normal skin bacteria although he states the drainage has slowed down quite a bit he may have had a mild infection not just colonization. And was much smaller and then since around10/04/2019 on evaluation today patient appears to be doing unfortunately worse as far as the size of the wound. I really feel like that this is steadily getting larger again it had been doing excellent right at the beginning of September we have seen a steady increase in the area of the wound it is almost 2-1/2 times the size it was on September 1. Obviously this is a bad trend this is not wanting to see. For that reason we went back  to using just the topical triamcinolone cream which does seem to help with inflammation. I checked him for bacteria by way of culture and nothing showed positive there. I am considering giving him a short  course of a tapering steroid [name], [name] ([id number]) today to see if that is can be beneficial for him. The patient is in agreement with giving that a try. 03/08/2019 on evaluation today patient appears to be doing very well in comparison to last evaluation with regard to his lower extremity ulcer. This is showing signs of less inflammation and actually measuring slightly smaller compared to last time every other week over the past month and a half he has been measuring larger larger larger. Nonetheless I do believe that the issue has been inflammation the prednisone does seem to have been beneficial for him which is good news. No fevers, chills, nausea, vomiting, or diarrhea. 03/22/2019 on evaluation today patient appears to be doing about the same with regard to his leg ulcer. He has been tolerating the dressing changes without complication. With that being said the wound seems to be mostly arrested at its current size but really is not making any progress except for when we prescribed the prednisone. He did show some signs of dropping as far as the overall size of the wound during that interval week. Nonetheless this is something he is not on long-term at this point and unfortunately I think he is getting need either this or else the Humira which his dermatologist has discussed try to get approval for. With that being said he will be seeing his dermatologist on the 11th of this month that is November. 04/19/2019 on evaluation today patient appears to be doing really about the same the wound is measuring slightly larger compared to last time I saw him. He has not been into the office since November 2 due to the fact that he unfortunately had Covid as that his entire family. He tells me that it was rough but they did pull-through and he seems to be doing much better. Fortunately there is no signs of active infection at this time. No fevers, chills, nausea, vomiting, or diarrhea. 05/10/2019  on evaluation today patient unfortunately appears to be doing significantly worse as compared to last time I saw him. He does tell me that he has had his first dose of Humira and actually is scheduled to get the next one in the upcoming week. With that being said he tells me also that in the past several days he has been having a lot of issues with green drainage she showed me a picture this is more blue-green in color. He is also been having issues with increased sloughy buildup and the wound does appear to be larger today. Obviously this is not the direction that we want everything to take based on the starting of his Humira. Nonetheless I think this is definitely a result of likely infection and to be honest I think this is probably Pseudomonas causing the infection based on what I am seeing. 05/24/2019 on evaluation today patient unfortunately appears to be doing significantly worse compared to his prior evaluation with me 2 weeks ago. I did review his culture results which showed that he does have Staph aureus as well as Pseudomonas noted on the culture. Nonetheless the Levaquin that I prescribed for him does not appear to have been appropriate and in fact he tells me he is no longer experiencing the green drainage and discharge that he had  at the last visit. Fortunately there is no signs of active infection at this time which is good news although the wound has significantly worsened it in fact is much deeper than it was previous. We have been utilizing up to this point triamcinolone ointment as the prescription topical of choice but at this time I really feel like that the wound is getting need to be packed in order to appropriately manage this due to the deeper nature of the wound. Therefore something along the lines of an alginate dressing may be more appropriate. 05/31/2019 upon inspection today patient's wound actually showed signs of doing poorly at this point. Unfortunately he just does not  seem to be making any good progress despite what we have tried. He actually did go ahead and pick up the Cipro and start taking that as he was noticing more green drainage he had previously completed the Levaquin that I prescribed for him as well. Nonetheless he missed his appointment for the seventh last week on Wednesday with the wound care center and [facility] where his dermatologist referred him. Obviously I do think a second opinion would be helpful at this point especially in light of the fact that the patient seems to be doing so poorly despite the fact that we have tried everything that I really know how at this point. The only thing that ever seems to have helped him in the past is when he was on high doses of continual steroids that did seem to make a difference for him. 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 06/07/2019 upon evaluation today patient unfortunately appears to be doing worse yet again with regard to his wound. In fact I am starting to question whether or not he may have a fluid pocket in the muscle at this point based on the bulging and the soft appearance to the central portion of the muscle area. There is not anything draining from the muscle itself at this time which is good news but nonetheless the wound is expanding. I am not really seeing any results of the Humira as far as overall wound progression based on what I am seeing at this point. The patient has been referred for second opinion with regard to his wound to the [facility] wound care center by his dermatologist which I definitely am not in opposition to. Unfortunately we tried multiple dressings in the past including collagen, alginate, and at one point even Hydrofera Blue. With that being said he is never really used it for any significant amount of time  due to the fact that he often complains of pain associated with these dressings and then will go back to either using the Santyl which she has done intermittently or more frequently the triamcinolone. He is also using his own compression stockings. We have wrapped him in the past but again that was something else that he really was not a big fan of. 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 He seemed to do much better when he was on the steroids although honestly I understand that the steroids are not necessarily the best medication to be on long-term obviously 06/14/2019 on evaluation today patient appears to be doing actually a little bit better with regard to the overall appearance with his leg. Unfortunately he does continue to have issues with what appears to be some fluid underneath the muscle although he did see the wound specialty center at [facility] last week their main goals were to see about infusion therapy in place of the Humira as they feel like that is not quite strong enough. They also recommended that we continue with the treatment otherwise as we are they felt like that was appropriate and they are okay with him continuing to follow-up here with [place] in that regard. With that being said they are also sending him to the vein specialist there to see about vein stripping and if that would be of benefit for him. Subsequently they also did not really address whether or not an ultrasound of the muscle area to see if there is anything that needs to be addressed here would be appropriate or not. For that reason I discussed this with him last week I think we may proceed down that  road at this point. 06/21/2019 upon evaluation today patient's wound actually appears to be doing slightly better compared to previous evaluations. I do believe that he has made a difference with regard to the progression here with the use of oral steroids. Again in the past has been the only thing that is really calm things down. He does tell me that from [facility] is gotten a good news from there that there are no further vein stripping that is necessary at this point. I do not have that available for review today although the patient did relay this to me. He also did obtain and have the ultrasound of the wound completed which I did sign off on today. It does appear that there is no fluid collection under the muscle this is likely then just edematous tissue in general. That is also good news. Overall I still believe the inflammation is the main issue here. He did inquire about the possibility of a wound VAC again with the muscle protruding like it is I am not really sure whether the wound VAC is necessarily ideal or not. That is something we will have to consider although I do believe he may need compression wrapping to try to help with edema control which could potentially be of benefit. 06/28/2019 on evaluation today patient appears to be doing slightly better measurement wise although this is not terribly smaller he least seems to be trending towards that direction. With that being said he still seems to have purulent drainage noted in the wound bed at this time. He has been on Levaquin followed by Cipro over the past month. Unfortunately he still seems to have some issues with active infection at this time. I did perform a culture last week in order to evaluate and see if indeed there was still anything going on. Subsequently the culture did come back [name], [name] ([id number]) showing Pseudomonas which is consistent with the drainage has been having which is blue-green in color. He also has had an odor  that again was somewhat consistent with Pseudomonas as well. Long story short it appears that the culture showed an intermediate finding with regard to how well the Cipro will work for the  Pseudomonas infection. Subsequently being that he does not seem to be clearing up and at best what we are doing is just keeping this at [place] I think he may need to see infectious disease to discuss IV antibiotic options. 07/05/2019 upon evaluation today patient appears to be doing okay in regard to his leg ulcer. He has been tolerating the dressing changes at this point without complication. Fortunately there is no signs of active infection at this time which is good news. No fevers, chills, nausea, vomiting, or diarrhea. With that being said he does have an appointment with infectious disease tomorrow and his primary care on Wednesday. Again the reason for the infectious disease referral was due to the fact that he did not seem to be fully resolving with the use of oral antibiotics and therefore we were thinking that IV antibiotic therapy may be necessary secondary to the fact that there was an intermediate finding for how effective the Cipro may be. Nonetheless again he has been having a lot of purulent and even green drainage. Fortunately right now that seems to have calmed down over the past week with the reinitiation of the oral antibiotic. Nonetheless we will see what Dr. Megan Salon has to say. 07/12/2019 upon evaluation today patient appears to be doing about the same at this point in regard to his left lower extremity ulcer. Fortunately there is no signs of active infection at this time which is good news I do believe the Levaquin has been beneficial I did review Dr. Hale Bogus note and to be honest I agree that the patient's leg does appear to be doing better currently. What we found in the past as he does not seem to really completely resolve he will stop the antibiotic and then subsequently things will revert  back to having issues with blue-green drainage, increased pain, and overall worsening in general. Obviously that is the reason I sent him back to infectious disease. 07/19/2019 upon evaluation today patient appears to be doing roughly the same in size there is really no dramatic improvement. He has started back on the Levaquin at this point and though he seems to be doing okay he did still have a lot of blue/green drainage noted on evaluation today unfortunately. I think that this is still indicative more likely of a Pseudomonas infection as previously noted and again he does see Dr. Megan Salon in just a couple of days. I do not know that were really able to effectively clear this with just oral antibiotics alone based on what I am seeing currently. Nonetheless we are still continue to try to manage as best we can with regard to the patient and his wound. I do think the wrap was helpful in decreasing the edema which is excellent news. No fevers, chills, nausea, vomiting, or diarrhea. 07/26/2019 upon evaluation today patient appears to be doing slightly better with regard to the overall appearance of the muscle there is no dark discoloration centrally. Fortunately there is no signs of active infection at this time. No fevers, chills, nausea, vomiting, or diarrhea. Patient's wound bed currently the patient did have an appointment with Dr. Megan Salon at infectious disease last week. With that being said Dr. Megan Salon the patient states was still somewhat hesitant about put him on any IV antibiotics he wanted [place] to repeat cultures today and then see where things go going forward. He does look like Dr. Megan Salon because of some improvement the patient did have with the Levaquin wanted [place] to see about repeating  cultures. If it indeed grows the Pseudomonas again then he recommended a possibility of considering a PICC line placement and IV antibiotic therapy. He plans to see the patient back in 1 to 2 weeks. 08/02/2019  upon evaluation today patient appears to be doing poorly with regard to his left lower extremity. We did get the results of his culture back it shows that he is still showing evidence of Pseudomonas which is consistent with the purulent/blue-green drainage that he has currently. Subsequently the culture also shows that he now is showing resistance to the oral fluoroquinolones which is unfortunate as that was really the only thing to treat the infection prior. I do believe that he is looking like this is going require IV antibiotic therapy to get this under control. Fortunately there is no signs of systemic infection at this time which is good news. The patient does see Dr. Megan Salon tomorrow. 08/09/2019 upon evaluation today patient appears to be doing better with regard to his left lower extremity ulcer in regard to the overall appearance. He is currently on IV antibiotic therapy. As ordered by Dr. Megan Salon. Currently the patient is on ceftazidime which she is going to take for the next 2 weeks and then follow-up for 4 to 5-week appointment with Dr. Megan Salon. The patient started this this past Friday symptoms have not for a total of 3 days currently in full. 08/16/2019 upon evaluation today patient's wound actually does show muscle in the base of the wound but in general does appear to be much better as far as the overall evidence of infection is concerned. In fact I feel like this is for the most part cleared up he still on the IV antibiotics he has not completed the full course yet but I think he is doing much better which is excellent news. 08/23/2019 upon evaluation today patient appears to be doing about the same with regard to his wound at this point. He tells me that he still has pain unfortunately. Fortunately there is no evidence of systemic infection at this time which is great news. There is significant muscle protrusion. 09/13/19 upon evaluation today patient appears to be doing about the same  in regard to his leg unfortunately. He still has a lot of drainage coming from the ulceration there is still muscle exposed. With that being said the patient's last wound culture still showed an intermediate finding with regard to the Pseudomonas he still having the bluish/green drainage as well. Overall I do not know that the wound has completely cleared of infection at this point. Fortunately there is no signs of active infection systemically at this point which is good news. 09/20/2019 upon evaluation today patient's wound actually appears to be doing about the same based on what I am seeing currently. I do not see any signs of systemic infection he still does have evidence of some local infection and drainage. He did see Dr. Megan Salon last week and Dr. Megan Salon states that he probably does need a different IV antibiotic although he does not want to put him on this until the patient begins the Remicade infusion which is actually scheduled for about 10 days out from today on 13 May. Following that time Dr. Megan Salon is good to see him back and then will evaluate the feasibility of starting him on the IV antibiotic therapy once again at that point. I do not disagree with this plan I do believe as Dr. Megan Salon stated in his note that I reviewed today that the  patient's issue is multifactorial with the pyoderma being 1 aspect of this that were hoping the Remicade will be helpful for her. In the meantime I think the gentamicin is, helping to keep things under decent okay control in regard to the ulcer. 09/27/2019 upon evaluation today patient appears to be doing about the same with regard to his wound still there is a lot of muscle exposure though he does have some hyper granulation tissue noted around the edge and actually some granulation tissue starting to form over the muscle which is actually good news. Fortunately there is no evidence of active infection which is also good news. His pain is less at this  point. 5/21; this is a patient I have not seen in a long time. 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 He has a large wound on the left lateral leg with protruding muscle. He comes in the clinic today showing the same area on his left medial ankle. He says there is been a spot there for some time although we have not previously defined this. Today he has a clearly defined area with slight amount of skin breakdown surrounded by raised areas with a purplish hue in color. This is not painful he says it is irritated. 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 He still has muscle protruding from the lateral portion of his left leg but fortunately the new area that they were concerned about at his last visit does not appear to have opened at this point. He is currently on Remicade infusions and seems to be doing better in my opinion in fact the wound itself seems to be overall much better. The purplish discoloration that he did have seems to have resolved and I think that is a good sign that hopefully the Remicade is doing its job. He does [name], [name] ([id number]) have some biofilm noted over the surface of the wound. Objective Constitutional Obese and well-hydrated in no acute distress. Vitals Time Taken: 8:12 AM, Height: 71 in, Weight: 338 lbs, BMI: 47.1, Temperature: 98.6 F, Pulse: 76 bpm, Respiratory Rate: 16 breaths/min, Blood Pressure: 131/70 mmHg. Respiratory normal breathing without difficulty. Psychiatric this patient is able to make decisions and demonstrates good insight into disease process. Alert and Oriented x 3. pleasant and cooperative. General Notes: Upon inspection patient's wound bed actually did show biofilm over the surface of the wound which was typical for what we are seeing recently. With that being said I was actually able to  mechanically debride this away today which the patient tolerated with saline and gauze he only had one time where it was so painful that he jumped the rest of the time he was able to manage. This is the first time we have been able to do this in months. With that being said once I was able to clear off most of the biofilm and slough from the surface of the wound the patient actually seems to be doing better with new granulation buds noted throughout the muscle area which is excellent news I am very pleased with this. Integumentary (Hair, Skin) Wound #1 status is Open. Original cause of wound was Gradually Appeared. The wound is located on the Left,Lateral Lower Leg. The wound measures 7.5cm length x 8.4cm width x 0.7cm depth; 49.48cm^2 area and 34.636cm^3 volume. There is muscle and Fat Layer (Subcutaneous Tissue) Exposed exposed. There is no tunneling or  undermining noted. There is a medium amount of purulent drainage noted. The wound margin is epibole. There is large (67-100%) pink granulation within the wound bed. There is a small (1-33%) amount of necrotic tissue within the wound bed including Adherent Slough. Wound #3 status is Open. Original cause of wound was Gradually Appeared. The wound is located on the Left,Medial Ankle. The wound measures 0.7cm length x 1.2cm width x 0.1cm depth; 0.66cm^2 area and 0.066cm^3 volume. The wound is limited to skin breakdown. There is a none present amount of drainage noted. The wound margin is flat and intact. There is no granulation within the wound bed. There is no necrotic tissue within the wound bed. 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 Wound #3 Left,Medial Ankle: Clean wound with Normal Saline. Anesthetic (add to Medication List): Wound #1 Left,Lateral Lower Leg: Topical Lidocaine 4% cream applied to wound bed prior to debridement (In Clinic Only). Wound #3 Left,Medial Ankle: Shank, Trisha ([id number]) Topical Lidocaine 4% cream applied to wound bed prior to debridement (In Clinic Only). Primary Wound Dressing: Gentamicin Sulfate Cream Wound #3 Left,Medial Ankle: Other: - clobetasol (TCA in clinic) Secondary Dressing: Wound #1 Left,Lateral Lower Leg: Boardered Foam Dressing Wound #3 Left,Medial Ankle: Boardered Foam Dressing Dressing Change Frequency: Wound #1 Left,Lateral Lower Leg: Change dressing every day. Wound #3 Left,Medial Ankle: Change dressing every day. Follow-up Appointments: Wound #1 Left,Lateral Lower Leg: Return Appointment in 1 week. Wound #3 Left,Medial Ankle: Return Appointment in 1 week. Edema Control: Wound #1 Left,Lateral Lower Leg: Patient to wear own compression stockings Elevate legs to the level of the heart and pump ankles as often as possible Wound #3 Left,Medial Ankle: Patient to wear own compression stockings Elevate legs to the level of the heart and pump ankles as often as possible 1. I would recommend currently that we go ahead and continue with the gentamicin cream to the wound bed followed by the border foam dressing the patient states that that seems to be doing well he wants to continue as such which I can completely understand since he finally felt something comfortable with the Remicade that seems to be working. 2. With regard to the patient's edema we probably do need to try to use compression on him but again right now I still want to make sure that we get things under good control and he seems to be doing so well that I hate to do that at the moment. If we do Need to do compression we likely would need to bring him in for nurse visit once a week as well. We  will see how things appear next week and I will reconsider this at that point. We will see patient back for reevaluation in 1 week here in the clinic. If anything worsens or changes patient will contact our office for additional recommendations. Electronic Signature(s) Signed: 10/25/2019 9:02:20 AM By: Worthy Keeler PA-C Entered By: Worthy Keeler on 10/25/2019 09:02:19 [name], [name] ([id number]) -------------------------------------------------------------------------------- SuperBill Details Patient Name: [name] Date of Service: 10/25/2019 Medical Record Number: [id number] Patient Account Number: [id number] Date of Birth/Sex: [birth year] (39 y.o. M) Treating RN: Army Melia Primary Care Provider: [name] Other Clinician: Referring Provider: [name] Treating Provider/Extender: Melburn Hake, Sherlie Boyum Weeks in Treatment: 150  Diagnosis Coding ICD-10 Codes Code Description [phone] Non-pressure chronic ulcer of left calf with fat layer exposed L88 Pyoderma gangrenosum L97.321 Non-pressure chronic ulcer of left ankle limited to breakdown of skin I87.2 Venous insufficiency (chronic) (peripheral) L03.116 Cellulitis of left lower limb Facility Procedures CPT4 Code: [id number] Description: 99213 - WOUND CARE VISIT-LEV 3 EST PT Modifier: Quantity: 1 Physician Procedures CPT4 Code: [id number] Description: 02334 - WC PHYS LEVEL 3 - EST PT Modifier: Quantity: 1 CPT4 Code: Description: ICD-10 Diagnosis Description L97.222 Non-pressure chronic ulcer of left calf with fat layer exposed L88 Pyoderma gangrenosum L97.321 Non-pressure chronic ulcer of left ankle limited to breakdown of skin I87.2 Venous insufficiency (chronic)  (peripheral) Modifier: Quantity: Electronic Signature(s) Signed: 10/25/2019 9:02:33 AM By: Worthy Keeler PA-C Entered By: Worthy Keeler on 10/25/2019 09:02:33

## 2019-11-01 ENCOUNTER — Encounter: Payer: 59 | Admitting: Physician Assistant

## 2019-11-01 ENCOUNTER — Other Ambulatory Visit: Payer: Self-pay

## 2019-11-01 DIAGNOSIS — L97222 Non-pressure chronic ulcer of left calf with fat layer exposed: Secondary | ICD-10-CM | POA: Diagnosis not present

## 2019-11-01 NOTE — Progress Notes (Signed)
[name], [name] ([id number]) Visit Report for 11/01/2019 Chief Complaint Document Details Patient Name: [name], [name] Date of Service: 11/01/2019 8:15 AM Medical Record Number: [id number] Patient Account Number: [id number] Date of Birth/Sex: 06-Jul-1980 (39 y.o. M) Treating RN: Army Melia Primary Care Provider: [name] Other Clinician: Referring Provider: [name] Treating Provider/Extender: Melburn Hake, Claudean Leavelle Weeks in Treatment: 151 Information Obtained from: Patient Chief Complaint He is here in follow up evaluation for LLE pyoderma ulcer Electronic Signature(s) Signed: 11/01/2019 8:32:46 AM By: Worthy Keeler PA-C Entered By: Worthy Keeler on 11/01/2019 08:32:45 [name], [name] ([id number]) -------------------------------------------------------------------------------- Problem List Details Patient Name: [name] Date of Service: 11/01/2019 8:15 AM Medical Record Number: [id number] Patient Account Number: [id number] Date of Birth/Sex: 03/05/1981 (40 y.o. M) Treating RN: Army Melia Primary Care Provider: [name] Other Clinician: Referring Provider: [name] Treating Provider/Extender: Melburn Hake, Janautica Netzley Weeks in Treatment: 151 Active Problems ICD-10 Encounter Code Description Active Date MDM Diagnosis L97.222 Non-pressure chronic ulcer of left calf with fat layer exposed 12/04/2016 No Yes L88 Pyoderma gangrenosum 03/26/2017 No Yes L97.321 Non-pressure chronic ulcer of left ankle limited to breakdown of skin 10/08/2019 No Yes I87.2 Venous insufficiency (chronic) (peripheral) 12/04/2016 No Yes L03.116 Cellulitis of left lower limb 05/24/2019 No Yes Inactive Problems ICD-10 Code Description Active Date Inactive Date L97.213 Non-pressure chronic ulcer of right calf with necrosis of muscle 04/02/2017 04/02/2017 Resolved Problems Electronic Signature(s) Signed: 11/01/2019 8:32:40 AM By: Worthy Keeler PA-C Entered By: Worthy Keeler on 11/01/2019  08:32:39

## 2019-11-01 NOTE — Progress Notes (Signed)
OBED, SAMEK ([id number]) Visit Report for 11/01/2019 Arrival Information Details Patient Name: [name], [name] Date of Service: 11/01/2019 8:15 AM Medical Record Number: [id number] Patient Account Number: [id number] Date of Birth/Sex: 1980-11-16 (39 y.o. M) Treating RN: Montey Hora Primary Care Milana Salay: [name] Other Clinician: Referring Hinton Luellen: [name] Treating Keylor Rands/Extender: Melburn Hake, [name]: 66 Visit Information History Since Last Visit Added or deleted any medications: No Patient Arrived: Ambulatory Any new allergies or adverse reactions: No Arrival Time: 08:23 Had a fall or experienced change in No Accompanied By: self activities of daily living that may affect Transfer Assistance: None risk of falls: Patient Identification Verified: Yes Signs or symptoms of abuse/neglect since last visito No Secondary Verification Process Completed: Yes Hospitalized since last visit: No Patient Requires Transmission-Based No Implantable device outside of the clinic excluding No Precautions: cellular tissue based products placed in the center Patient Has Alerts: Yes since last visit: Patient Alerts: 08/09/19 Picc Line Has Dressing in Place as Prescribed: Yes Right Has Compression in Place as Prescribed: Yes Pain Present Now: Yes Electronic Signature(s) Signed: 11/01/2019 12:43:42 PM By: Montey Hora Entered By: Montey Hora on 11/01/2019 08:25:46 [name] ([id number]) -------------------------------------------------------------------------------- Clinic Level of Care Assessment Details Patient Name: [name] Date of Service: 11/01/2019 8:15 AM Medical Record Number: [id number] Patient Account Number: [id number] Date of Birth/Sex: November 18, 1980 (39 y.o. M) Treating RN: Montey Hora Primary Care Jakavion Bilodeau: [name] Other Clinician: Referring Meela Wareing: [name] Treating Denys Labree/Extender: Melburn Hake, HOYT Weeks in  Treatment: 151 Clinic Level of Care Assessment Items TOOL 4 Quantity Score []  - Use when only an EandM is performed on FOLLOW-UP visit 0 ASSESSMENTS - Nursing Assessment / Reassessment X - Reassessment of Co-morbidities (includes updates in patient status) 1 10 X- 1 5 Reassessment of Adherence to Treatment Plan ASSESSMENTS - Wound and Skin Assessment / Reassessment []  - Simple Wound Assessment / Reassessment - one wound 0 X- 2 5 Complex Wound Assessment / Reassessment - multiple wounds []  - 0 Dermatologic / Skin Assessment (not related to wound area) ASSESSMENTS - Focused Assessment []  - Circumferential Edema Measurements - multi extremities 0 []  - 0 Nutritional Assessment / Counseling / Intervention X- 1 5 Lower Extremity Assessment (monofilament, tuning fork, pulses) []  - 0 Peripheral Arterial Disease Assessment (using hand held doppler) ASSESSMENTS - Ostomy and/or Continence Assessment and Care []  - Incontinence Assessment and Management 0 []  - 0 Ostomy Care Assessment and Management (repouching, etc.) PROCESS - Coordination of Care X - Simple Patient / Family Education for ongoing care 1 15 []  - 0 Complex (extensive) Patient / Family Education for ongoing care X- 1 10 Staff obtains Programmer, systems, Records, Test Results / Process Orders []  - 0 Staff telephones HHA, Nursing Homes / Clarify orders / etc []  - 0 Routine Transfer to another Facility (non-emergent condition) []  - 0 Routine Hospital Admission (non-emergent condition) []  - 0 New Admissions / Biomedical engineer / Ordering NPWT, Apligraf, etc. []  - 0 Emergency Hospital Admission (emergent condition) X- 1 10 Simple Discharge Coordination []  - 0 Complex (extensive) Discharge Coordination PROCESS - Special Needs []  - Pediatric / Minor Patient Management 0 []  - 0 Isolation Patient Management []  - 0 Hearing / Language / Visual special needs []  - 0 Assessment of Community assistance (transportation, D/C  planning, etc.) []  - 0 Additional assistance / Altered mentation []  - 0 Support Surface(s) Assessment (bed, cushion, seat, etc.) INTERVENTIONS - Wound Cleansing / Measurement Sing, Kadyn ([id number]) X- 1 5 Simple Wound Cleansing - one  wound []  - 0 Complex Wound Cleansing - multiple wounds X- 1 5 Wound Imaging (photographs - any number of wounds) []  - 0 Wound Tracing (instead of photographs) X- 1 5 Simple Wound Measurement - one wound []  - 0 Complex Wound Measurement - multiple wounds INTERVENTIONS - Wound Dressings X - Small Wound Dressing one or multiple wounds 1 10 []  - 0 Medium Wound Dressing one or multiple wounds []  - 0 Large Wound Dressing one or multiple wounds X- 1 5 Application of Medications - topical []  - 0 Application of Medications - injection INTERVENTIONS - Miscellaneous []  - External ear exam 0 []  - 0 Specimen Collection (cultures, biopsies, blood, body fluids, etc.) []  - 0 Specimen(s) / Culture(s) sent or taken to Lab for analysis []  - 0 Patient Transfer (multiple staff / Civil Service fast streamer / Similar devices) []  - 0 Simple Staple / Suture removal (25 or less) []  - 0 Complex Staple / Suture removal (26 or more) []  - 0 Hypo / Hyperglycemic Management (close monitor of Blood Glucose) []  - 0 Ankle / Brachial Index (ABI) - do not check if billed separately X- 1 5 Vital Signs Has the patient been seen at the hospital within the last three years: Yes Total Score: 100 Level Of Care: New/Established - Level 3 Electronic Signature(s) Signed: 11/01/2019 12:43:42 PM By: Montey Hora Entered By: Montey Hora on 11/01/2019 08:53:42 [name] ([id number]) -------------------------------------------------------------------------------- Encounter Discharge Information Details Patient Name: [name] Date of Service: 11/01/2019 8:15 AM Medical Record Number: [id number] Patient Account Number: [id number] Date of Birth/Sex: 01-01-1981 (39 y.o. M) Treating RN:  Montey Hora Primary Care Adelise Buswell: [name] Other Clinician: Referring Tery Hoeger: [name] Treating Adams Hinch/Extender: Melburn Hake, [name]: 151 Encounter Discharge Information Items Discharge Condition: Stable Ambulatory Status: Ambulatory Discharge Destination: Home Transportation: Private Auto Accompanied By: self Schedule Follow-up Appointment: Yes Clinical Summary of Care: Electronic Signature(s) Signed: 11/01/2019 8:54:54 AM By: Montey Hora Entered By: Montey Hora on 11/01/2019 08:54:53 [name] ([id number]) -------------------------------------------------------------------------------- Lower Extremity Assessment Details Patient Name: [name] Date of Service: 11/01/2019 8:15 AM Medical Record Number: [id number] Patient Account Number: [id number] Date of Birth/Sex: [birth year] (39 y.o. M) Treating RN: Montey Hora Primary Care Arcangel Minion: [name] Other Clinician: Referring Cortney Mckinney: [name] Treating Huda Petrey/Extender: STONE III, [name]: 151 Edema Assessment Assessed: [Left: No] [Right: No] Edema: [Left: Ye] [Right: s] Vascular Assessment Pulses: Dorsalis Pedis Palpable: [Left:Yes] Electronic Signature(s) Signed: 11/01/2019 12:43:42 PM By: Montey Hora Entered By: Montey Hora on 11/01/2019 08:27:39 [name] ([id number]) -------------------------------------------------------------------------------- Multi Wound Chart Details Patient Name: [name] Date of Service: 11/01/2019 8:15 AM Medical Record Number: [id number] Patient Account Number: [id number] Date of Birth/Sex: 08-04-1980 (39 y.o. M) Treating RN: Montey Hora Primary Care Jessice Madill: [name] Other Clinician: Referring Murphy Bundick: [name] Treating Valor [place]/Extender: Melburn Hake, [name]: 151 Vital Signs Height(in): 71 Pulse(bpm): 76 Weight(lbs): 338 Blood Pressure(mmHg): 122/72 Body  Mass Index(BMI): 47 Temperature(F): 98.4 Respiratory Rate(breaths/min): 18 Photos: [N/A:N/A] Wound Location: Left, Lateral Lower Leg Left, Medial Ankle N/A Wounding Event: Gradually Appeared Gradually Appeared N/A Primary Etiology: Pyoderma Venous Leg Ulcer N/A Comorbid History: Sleep Apnea, Hypertension, Colitis Sleep Apnea, Hypertension, Colitis N/A Date Acquired: 11/18/2015 09/18/2019 N/A Weeks of Treatment: 151 3 N/A Wound Status: Open Open N/A Measurements L x W x D (cm) 7x6.8x0.8 0.7x1.2x0.1 N/A Area (cm) : 37.385 0.66 N/A Volume (cm) : 29.908 0.066 N/A % Reduction in Area: -661.60% 37.70% N/A % Reduction in Volume: -661.60% 37.70% N/A Classification: Full  Thickness With Exposed Partial Thickness N/A Support Structures Exudate Amount: Medium None Present N/A Exudate Type: Purulent N/A N/A Exudate Color: yellow, brown, green N/A N/A Wound Margin: Epibole Flat and Intact N/A Granulation Amount: Large (67-100%) None Present (0%) N/A Granulation Quality: Pink N/A N/A Necrotic Amount: Small (1-33%) None Present (0%) N/A Exposed Structures: Fat Layer (Subcutaneous Tissue) Fascia: No N/A Exposed: Yes Fat Layer (Subcutaneous Tissue) Muscle: Yes Exposed: No Fascia: No Tendon: No Tendon: No Muscle: No Joint: No Joint: No Bone: No Bone: No Limited to Skin Breakdown Epithelialization: None Large (67-100%) N/A Treatment Notes Electronic Signature(s) Signed: 11/01/2019 12:43:42 PM By: Montey Hora Entered By: Montey Hora on 11/01/2019 08:33:25 [name] ([id number]) -------------------------------------------------------------------------------- Multi-Disciplinary Care Plan Details Patient Name: [name] Date of Service: 11/01/2019 8:15 AM Medical Record Number: [id number] Patient Account Number: [id number] Date of Birth/Sex: 1980-08-08 (39 y.o. M) Treating RN: Montey Hora Primary Care Legna Mausolf: [name] Other Clinician: Referring Ayvah Caroll: [name] Treating Criag Wicklund/Extender: Melburn Hake, [name]: 151 Active Inactive Venous Leg Ulcer Nursing Diagnoses: Knowledge deficit related to disease process and management Potential for venous Insuffiency (use before diagnosis confirmed) Goals: Non-invasive venous studies are completed as ordered Date Initiated: 12/11/2016 Target Resolution Date: 03/13/2017 Goal Status: Active Patient will maintain optimal edema control Date Initiated: 12/11/2016 Target Resolution Date: 03/13/2017 Goal Status: Active Patient/caregiver will verbalize understanding of disease process and disease management Date Initiated: 12/11/2016 Target Resolution Date: 03/13/2017 Goal Status: Active Verify adequate tissue perfusion prior to therapeutic compression application Date Initiated: 12/11/2016 Target Resolution Date: 03/13/2017 Goal Status: Active Interventions: Assess peripheral edema status every visit. Compression as ordered Provide education on venous insufficiency Treatment Activities: Therapeutic compression applied : 12/11/2016 Notes: Wound/Skin Impairment Nursing Diagnoses: Impaired tissue integrity Knowledge deficit related to ulceration/compromised skin integrity Goals: Patient/caregiver will verbalize understanding of skin care regimen Date Initiated: 12/11/2016 Target Resolution Date: 03/13/2017 Goal Status: Active Ulcer/skin breakdown will have a volume reduction of 30% by week 4 Date Initiated: 12/11/2016 Target Resolution Date: 03/13/2017 Goal Status: Active Ulcer/skin breakdown will have a volume reduction of 50% by week 8 Date Initiated: 12/11/2016 Target Resolution Date: 03/13/2017 Goal Status: Active Ulcer/skin breakdown will have a volume reduction of 80% by week 12 Date Initiated: 12/11/2016 Target Resolution Date: 03/13/2017 Goal Status: Active Ulcer/skin breakdown will heal within 14 weeks Date Initiated: 12/11/2016 Target Resolution Date:  03/13/2017 Goal Status: Active QUENTEZ, LOBER ([id number]) Interventions: Assess patient/caregiver ability to obtain necessary supplies Assess patient/caregiver ability to perform ulcer/skin care regimen upon admission and as needed Assess ulceration(s) every visit Provide education on ulcer and skin care Treatment Activities: Skin care regimen initiated : 12/11/2016 Topical wound management initiated : 12/11/2016 Notes: Electronic Signature(s) Signed: 11/01/2019 12:43:42 PM By: Montey Hora Entered By: Montey Hora on 11/01/2019 08:33:17 [name] ([id number]) -------------------------------------------------------------------------------- Pain Assessment Details Patient Name: [name] Date of Service: 11/01/2019 8:15 AM Medical Record Number: [id number] Patient Account Number: [id number] Date of Birth/Sex: 12-27-1980 (39 y.o. M) Treating RN: Montey Hora Primary Care Valeria Boza: [name] Other Clinician: Referring Lanetta Figuero: [name] Treating Jaena Brocato/Extender: Melburn Hake, [name]: 151 Active Problems Location of Pain Severity and Description of Pain Patient Has Paino Yes Site Locations Pain Location: Pain in Ulcers With Dressing Change: Yes Duration of the Pain. Constant / Intermittento Constant Character of Pain Describe the Pain: Other: stinging Pain Management and Medication Current Pain Management: Electronic Signature(s) Signed: 11/01/2019 12:43:42 PM By: Montey Hora Entered By: Montey Hora on 11/01/2019 08:26:18 Wyble, [name] ([id number]) --------------------------------------------------------------------------------  Patient/Caregiver Education Details Patient Name: [name], RAGONE Date of Service: 11/01/2019 8:15 AM Medical Record Number: [id number] Patient Account Number: [id number] Date of Birth/Gender: 1981-03-11 (39 y.o. M) Treating RN: Montey Hora Primary Care Physician: [name] Other Clinician: Referring  Physician: [name] Treating Physician/Extender: Sharalyn Ink in Treatment: 151 Education Assessment Education Provided To: Patient Education Topics Provided Wound/Skin Impairment: Handouts: Other: wound care as ordered Methods: Demonstration, Explain/Verbal Responses: State content correctly Electronic Signature(s) Signed: 11/01/2019 12:43:42 PM By: Montey Hora Entered By: Montey Hora on 11/01/2019 08:54:12 [name] ([id number]) -------------------------------------------------------------------------------- Wound Assessment Details Patient Name: [name] Date of Service: 11/01/2019 8:15 AM Medical Record Number: [id number] Patient Account Number: [id number] Date of Birth/Sex: [birth year] (39 y.o. M) Treating RN: Montey Hora Primary Care Jameka Ivie: [name] Other Clinician: Referring Svetlana Bagby: [name] Treating Stephnie Parlier/Extender: Melburn Hake, [name]: 151 Wound Status Wound Number: 1 Primary Etiology: Pyoderma Wound Location: Left, Lateral Lower Leg Wound Status: Open Wounding Event: Gradually Appeared Comorbid History: Sleep Apnea, Hypertension, Colitis Date Acquired: 11/18/2015 Weeks Of Treatment: 151 Clustered Wound: No Photos Wound Measurements Length: (cm) 7 Width: (cm) 6.8 Depth: (cm) 0.8 Area: (cm) 37.385 Volume: (cm) 29.908 % Reduction in Area: -661.6% % Reduction in Volume: -661.6% Epithelialization: None Tunneling: No Undermining: No Wound Description Classification: Full Thickness With Exposed Support Structures Wound Margin: Epibole Exudate Amount: Medium Exudate Type: Purulent Exudate Color: yellow, brown, green Foul Odor After Cleansing: No Slough/Fibrino Yes Wound Bed Granulation Amount: Large (67-100%) Exposed Structure Granulation Quality: Pink Fascia Exposed: No Necrotic Amount: Small (1-33%) Fat Layer (Subcutaneous Tissue) Exposed: Yes Necrotic Quality: Adherent Slough Tendon  Exposed: No Muscle Exposed: Yes Necrosis of Muscle: No Joint Exposed: No Bone Exposed: No Treatment Notes Wound #1 (Left, Lateral Lower Leg) Notes gent oinTment on lateral leg TCA on medial with BFD Coppock, [place] ([id number]) Electronic Signature(s) Signed: 11/01/2019 12:43:42 PM By: Montey Hora Entered By: Montey Hora on 11/01/2019 08:32:28 [name] ([id number]) -------------------------------------------------------------------------------- Wound Assessment Details Patient Name: [name] Date of Service: 11/01/2019 8:15 AM Medical Record Number: [id number] Patient Account Number: [id number] Date of Birth/Sex: 1980/11/29 (39 y.o. M) Treating RN: Montey Hora Primary Care Mariabella Nilsen: [name] Other Clinician: Referring Londynn Sonoda: [name] Treating Vonette Grosso/Extender: Melburn Hake, [name]: 151 Wound Status Wound Number: 3 Primary Etiology: Venous Leg Ulcer Wound Location: Left, Medial Ankle Wound Status: Healed - Epithelialized Wounding Event: Gradually Appeared Comorbid History: Sleep Apnea, Hypertension, Colitis Date Acquired: 09/18/2019 Weeks Of Treatment: 3 Clustered Wound: No Photos Wound Measurements Length: (cm) 0 Width: (cm) 0 Depth: (cm) 0 Area: (cm) 0 Volume: (cm) 0 % Reduction in Area: 100% % Reduction in Volume: 100% Epithelialization: Large (67-100%) Wound Description Classification: Partial Thickness Wound Margin: Flat and Intact Exudate Amount: None Present Foul Odor After Cleansing: No Slough/Fibrino No Wound Bed Granulation Amount: None Present (0%) Exposed Structure Necrotic Amount: None Present (0%) Fascia Exposed: No Fat Layer (Subcutaneous Tissue) Exposed: No Tendon Exposed: No Muscle Exposed: No Joint Exposed: No Bone Exposed: No Limited to Skin Breakdown Electronic Signature(s) Signed: 11/01/2019 12:43:42 PM By: Montey Hora Entered By: Montey Hora on 11/01/2019 08:43:32 [name]  ([id number]) -------------------------------------------------------------------------------- Vitals Details Patient Name: [name] Date of Service: 11/01/2019 8:15 AM Medical Record Number: [id number] Patient Account Number: [id number] Date of Birth/Sex: March 08, 1981 (39 y.o. M) Treating RN: Montey Hora Primary Care Aimar Sh[place]: [name] Other Clinician: Referring Elna Radovich: [name] Treating Shanieka Blea/Extender: Melburn Hake, [name]: 151 Vital Signs Time Taken: 08:26 Temperature (F): 98.4  Height (in): 71 Pulse (bpm): 76 Weight (lbs): 338 Respiratory Rate (breaths/min): 18 Body Mass Index (BMI): 47.1 Blood Pressure (mmHg): 122/72 Reference Range: 80 - 120 mg / dl Electronic Signature(s) Signed: 11/01/2019 12:43:42 PM By: Montey Hora Entered By: Montey Hora on 11/01/2019 08:26:52

## 2019-11-08 ENCOUNTER — Other Ambulatory Visit: Payer: Self-pay

## 2019-11-08 ENCOUNTER — Encounter: Payer: 59 | Admitting: Physician Assistant

## 2019-11-08 DIAGNOSIS — L97222 Non-pressure chronic ulcer of left calf with fat layer exposed: Secondary | ICD-10-CM | POA: Diagnosis not present

## 2019-11-08 MED ORDER — TRIAMCINOLONE ACETONIDE 0.1 % TOPICAL OINTMENT
0 days
Start: 2019-11-08 — End: ?

## 2019-11-08 MED ORDER — GENTAMICIN 0.1 % TOPICAL CREAM
0 days
Start: 2019-11-08 — End: ?

## 2019-11-09 NOTE — Progress Notes (Signed)
[name] ([id number]) Visit Report for 11/08/2019 Chief Complaint Document Details Patient Name: [name], [name] Date of Service: 11/08/2019 8:15 AM Medical Record Number: [id number] Patient Account Number: [id number] Date of Birth/Sex: 1980-09-25 (39 y.o. 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 M) Treating RN: [name] Primary Care Provider: Alma [name] Other Clinician: Referring Provider: Alma [name] Treating Provider/Extender: [name], Bhavika Schnider [name] in Treatment: 152 History of Present Illness HPI Description: 12/04/16; 39 year old man who comes into the clinic today for review of a wound on the posterior left calf. He tells me that is been there for about a year. He is not a diabetic he does smoke half a pack per day. He was seen in the ER on 11/20/16 felt to have cellulitis around the wound and was given clindamycin. An x-ray did not show osteomyelitis. The patient initially tells me that he has a milk allergy that sets off a pruritic itching rash on his lower legs which she scratches incessantly and he thinks that's what may have set up the wound. He has been using various topical antibiotics and ointments without any effect. He works in a [employer] and is on his feet all day. He does not have a  prior history of wounds however he does have the rash on both lower legs the right arm and the ventral aspect of his left arm. These are excoriations and clearly have had scratching however there are of macular looking areas on both legs including a substantial larger area on the right leg. This does not have an underlying open area. There is no blistering. The patient tells me that 2 years ago in [place] in response to the rash on his legs he saw a dermatologist who told him he had a condition which may be pyoderma gangrenosum although I may be putting words into his mouth. He seemed to recognize this. 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 He is not in any treatment for this. He's had no recent travel 12/11/16; the patient arrives today with his wound and roughly the same condition we've been using silver alginate this is a deep punched out wound with some surrounding erythema but no tenderness. Biopsy I did did not show confirmed pyoderma gangrenosum suggested nonspecific inflammation and vasculitis but does not provide an actual description of what was seen by the pathologist. I'm really not able to understand this We have also received information from the patient's dermatologist in [place] notes from April 2016. This was a doctor Agarwal-antal. 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 He was prescribed topical steroid high potency under occlusion which helped but at this point the patient did not have a deep punched out wound. 12/18/16; the patient's wound is larger in terms of surface area however this surface looks better and there is less depth. The surrounding erythema also is better. The patient states that the wrap we put on came off 2 days ago when he has been using his  compression stockings. He we are in the process of getting a dermatology consult. 12/26/16 on evaluation today patient's left lower extremity wound shows evidence of  infection with surrounding erythema noted. He has been tolerating the dressing changes but states that he has noted more discomfort. There is a larger area of erythema surrounding the wound. No fevers, chills, nausea, or vomiting noted at this time. With that being said the wound still does have slough covering the surface. He is not allergic to any medication that he is aware of at this point. In regard to his right lower extremity he had several regions that are erythematous and pruritic he wonders if there's anything we can do to help that. 01/02/17 I reviewed patient's wound culture which was obtained his visit last week. He was placed on doxycycline at that point. Unfortunately that does not appear to be an antibiotic that would likely help with the situation however the pseudomonas noted on culture is sensitive to Cipro. Also unfortunately patient's wound seems to have a large compared to last week's evaluation. Not severely so but there are definitely increased measurements in general. He is continuing to have discomfort as well he writes this to be a seven out of 10. In fact he would prefer me not to perform any debridement today due to the fact that he is having discomfort and considering he has an active infection on the little reluctant to do so anyway. No fevers, chills, nausea, or vomiting noted at this time. 01/08/17; patient seems dermatology on September 5. I suspect dermatology will want the slides from the biopsy I did sent to their pathologist. I'm not sure if there is a way we can expedite that. In any case the culture I did before I left on vacation 3 [name] ago showed Pseudomonas he was given 10 days of Cipro and per her description of her intake nurses is actually somewhat better this week although the wound is quite a bit bigger than I remember the last time I saw this. He still has 3 more days of Cipro 01/21/17; dermatology appointment tomorrow. He has completed the ciprofloxacin  for Pseudomonas. Surface of the wound looks better however he is had some deterioration in the lesions on his right leg. Meantime the left lateral leg wound we will continue with sample 01/29/17; patient had his dermatology appointment but I can't yet see that note. He is completed his antibiotics. The wound is more superficial but considerably larger in circumferential area than when he came in. This is in his left lateral calf. He also has swollen erythematous areas with superficial wounds on the right leg and small papular areas on both arms. There apparently areas in her his upper thighs and buttocks I did not look at those. Dermatology biopsied the right leg. Hopefully will have their input next week. 02/05/17; patient went back to see his dermatologist who told him that he had a "scratching problem" as well as staph. He is now on a 30 day course of doxycycline and I believe she gave him triamcinolone cream to the right leg areas to help with the itching [not exactly sure but probably triamcinolone]. She apparently looked at the left lateral leg wound although this was not rebiopsied and I think felt to be ultimately part of the same pathogenesis. He is using sample border foam and changing nevus himself. He now has a new open area on the right posterior leg which was his biopsy site I don't have any of the  dermatology notes 02/12/17; we put the patient in compression last week with SANTYL to the wound on the left leg and the biopsy. Edema is much better and the depth of the wound is now at level of skin. Area is still the same oBiopsy site on the right lateral leg we've also been using santyl with a border foam dressing and he is changing this himself. 02/19/17; Using silver alginate started last week to both the substantial left leg wound and the biopsy site on the right wound. He is tolerating compression well. Has a an appointment with his primary M.D. tomorrow wondering about diuretics although  I'm wondering if the edema problem is actually lymphedema 02/26/17; the patient has been to see his primary doctor Dr. Jerrel Ivory at [facility] our primary care. She started him on Lasix 20 mg and this seems to have helped with the edema. However we are not making substantial change with the left lateral calf wound and inflammation. The biopsy site on the right leg also looks stable but not really all that different. 03/12/17; the patient has been to see vein and vascular Dr. Lucky Cowboy. He has had venous reflux studies I have not reviewed these. I did get a call from his dermatology office. They felt that he might have pathergy based on their biopsy on his right leg which led them to look at the slides of [name], [name] ([id number]) the biopsy I did on the left leg and they wonder whether this represents pyoderma gangrenosum which was the original supposition in a man with ulcerative colitis albeit inactive for many years. They therefore recommended clobetasol and tetracycline i.e. 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 03/26/17; apparently the patient just had reflux studies not an appointment with Dr. dew. She arrives in clinic today having applied clobetasol for 2-3 [name]. He notes over the last 2-3 days excessive drainage having to change the dressing 3-4 times a day and also expanding erythema. 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 Lillia Carmel at [facility] dermatology locally who kindly saw him at my request. 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 My concern was the degree of erythema medially extending from his left leg wound which was either inflammation from pyoderma  or cellulitis. I put him on Augmentin however culture of the wound showed Pseudomonas which is quinolone sensitive. I really don't believe he has cellulitis however in view of everything I will continue and give him a course of Cipro. He is also on doxycycline as an immune modulator for the pyoderma. In addition to his original wound on the left lateral leg with surrounding erythema he has a wound on the right posterior calf which was an original biopsy site done by dermatology. 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 Saw Dr. Lillia Carmel yesterday. He has been using topical antibiotics to both wound areas his original wound on the left and the biopsies/pathergy area on the right. There is definitely some improvement in the inflammation around the wound on the right although the patient states he has increasing sensitivity of the wounds. He is on prednisone 60 and doxycycline 1 as prescribed by Dr. Lillia Carmel. He is covering the topical antibiotic  with gauze and putting this in his own compression stocks and changing this daily. He states that Dr. Lottie Rater did a culture of the left leg wound yesterday 05/07/17; pyoderma gangrenosum. The patient saw Dr. Lillia Carmel yesterday and has a follow-up with her in one month. He is still using topical antibiotics to both wounds although he can't recall exactly what type. He is still on prednisone 60 mg. Dr. Lillia Carmel stated that the doxycycline could stop if we were in agreement. 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 The right medial leg was induced by biopsy/pathergy. The area on the right is essentially healed. 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 The right medial leg has closed and remains closed. He  is still on prednisone 60. oHe tells me he missed his last dermatology appointment with Dr. Lillia Carmel but will make another appointment. He reports that her blood sugar at a recent screen in [place] was high 200's. He was 180 today. 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 I think he is going to require still much longer prednisone perhaps another 3 months before attempting to taper. In the meantime his wound is a lot better. Smaller. He is cleaning this off daily and applying topical antibiotics. When he was last in the clinic I thought about changing to [facility] and actually put in a couple of calls to dermatology although probably not during their business hours. 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 He continues on oral prednisone. He has been using triple antibiotic ointment. There is surface debris and we will transition to [facility] and have him return in 2 [name]. He has lost 30 pounds since his last appointment with lifestyle modification. He may benefit from topical steroid cream for treatment this can be considered at a later date. 08/22/17 on evaluation today patient appears to actually be doing rather well in regard to his left lateral lower extremity ulcer. He has actually been managed by Dr. Dellia Nims most recently. Patient is currently on oral steroids at this time. This seems to have been of benefit for him. Nonetheless his last visit was actually with Leah on 08/06/17. Currently he is not utilizing any topical steroid creams although this could be of benefit as well. No fevers, chills, nausea, or vomiting noted at this time. 09/05/17 on evaluation today patient appears to be doing better in regard to his left lateral lower extremity ulcer. He has been tolerating the dressing changes without complication. He is using Santyl with good effect. Overall I'm  very pleased with how things are standing at this point. Patient likewise is happy that this is doing better. 09/19/17 on evaluation today patient actually appears to be doing rather well in regard to his left lateral lower extremity ulcer. 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 He's not having any significant pain. 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 He has been tolerating the Santyl without complication and in general I feel like he's making good progress. 10/17/17 on evaluation  today patient appears to be doing very well in regard to his left lateral lower surety ulcer. He has been tolerating the dressing changes without complication. There does not appear to be any evidence of infection he's alternating the Santyl and the triple antibiotic ointment every other day this seems to be doing well for him. 11/03/17 on evaluation today patient appears to be doing very well in regard to his left lateral lower extremity ulcer. He is been tolerating the dressing changes without complication which is good news. Fortunately there does not appear to be any evidence of infection which is also great news. Overall is doing excellent they are starting to taper down on the prednisone is down to 40 mg at this point it also started topical clobetasol for him. 11/17/17 on evaluation today patient appears to be doing well in regard to his left lateral lower surety ulcer. He's been tolerating the dressing changes without complication. He does note that he is having no pain, no excessive drainage or discharge, and overall he feels like things are going about how he would expect and hope they would. Overall he seems to have no evidence of infection at this time in my opinion which is good news. 12/04/17-He is seen in follow-up  evaluation for right lateral lower extremity ulcer. He has been applying topical steroid cream. Today's measurement show slight increase in size. Over the next 2 [name] we will transition to every other day Santyl and steroid cream. He has been encouraged to monitor for changes and notify clinic with any concerns 12/15/17 on evaluation today patient's left lateral motion the ulcer and fortunately is doing worse again at this point. This just since last week to this week has close to doubled in size according to the patient. I did not seeing last week's I do not have a visual to compare this to in our system was also down so we do not have all the charts and at this point. Nonetheless it does have me somewhat concerned in regard to the fact that again he was worried enough about it he has contact the dermatology that placed them back on the full strength, 50 mg a day of the prednisone that he was taken previous. He continues to alternate using clobetasol along with Santyl at this point. He is obviously somewhat frustrated. 12/22/17 on evaluation today patient appears to be doing a little worse compared to last evaluation. Unfortunately the wound is a little deeper and slightly larger than the last week's evaluation. With that being said he has made some progress in regard to the irritation surrounding at this time unfortunately despite that progress that's been made he still has a significant issue going on here. 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 [name], [name] ([id number]) For that reason I discussed with him today the possibility of perform a wound culture to ensure there's nothing overtly infected. 01/06/18 on evaluation today patient's wound is larger and deeper than previously evaluated. With that being said it did appear that his wound was  infected after my last evaluation with him. Subsequently I did end up prescribing a prescription for Bactrim DS which she has been taking and having no complication with. Fortunately there does not appear to be any evidence of infection at this point in time as far as anything spreading, no want to touch, and overall I  feel like things are showing signs of improvement. 01/13/18 on evaluation today patient appears to be even a little larger and deeper than last time. There still muscle exposed in the base of the wound. Nonetheless he does appear to be less erythematous I do believe inflammation is calming down also believe the infection looks like it's probably resolved at this time based on what I'm seeing. No fevers, chills, nausea, or vomiting noted at this time. 01/30/18 on evaluation today patient actually appears to visually look better for the most part. Unfortunately those visually this looks better he does seem to potentially have what may be an abscess in the muscle that has been noted in the central portion of the wound. This is the first time that I have noted what appears to be fluctuance in the central portion of the muscle. With that being said I'm somewhat more concerned about the fact that this might indicate an abscess formation at this location. I do believe that an ultrasound would be appropriate. This is likely something we need to try to do as soon as possible. He has been switch to mupirocin ointment and he is no longer using the steroid ointment as prescribed by dermatology he sees them again next week he's been decreased from 60 to 40 mg of prednisone. 03/09/18 on evaluation today patient actually appears to be doing a little better compared to last time I saw him. There's not as much erythema surrounding the wound itself. He I did review his most recent infectious disease note which was dated 02/24/18. He saw Dr. Michel Bickers in [place]. 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 Patient is now off of antibiotics and they are continually observing this. There seems to be no change in the past two [name] in my pinion based on what the patient says and what I see today compared to what Dr. Megan Salon likely saw two [name] ago. No fevers, chills, nausea, or vomiting noted at this time. 03/23/18 on evaluation today patient's wound actually appears to be showing signs of improvement which is good news. He is currently still on the Dapsone. He is also working on tapering the prednisone to get off of this and Dr. Lottie Rater is working with him in this regard. Nonetheless overall I feel like the wound is doing well it does appear based on the infectious disease note that I reviewed from Dr. 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 I did also review the results of his ultrasound of left lower extremity which revealed there was a dentist tissue in the base of the wound without an abscess noted. 04/06/18 on evaluation today the patient's left lateral lower extremity ulcer actually appears to be doing fairly well which is excellent news. There does not appear to be any evidence of infection at this time which is also great news. Overall he still does have a significantly large ulceration although little by little he seems to be making progress. He is down to 10 mg a day of the prednisone. 04/20/18 on evaluation today patient actually appears to be doing excellent at this time in regard to his left lower extremity ulcer. He's making signs of good progress unfortunately this is taking much longer than we would really like to see but nonetheless  he is making progress. Fortunately there does not appear to be any evidence of infection at this time. No fevers, chills, nausea, or vomiting noted at  this time. The patient has not been using the Santyl due to the cost he hadn't got in this field yet. He's mainly been using the antibiotic ointment topically. Subsequently he also tells me that he really has not been scrubbing in the shower I think this would be helpful again as I told him it doesn't have to be anything too aggressive to even make it believe just enough to keep it free of some of the loose slough and biofilm on the wound surface. 05/11/18 on evaluation today patient's wound appears to be making slow but sure progress in regard to the left lateral lower extremity ulcer. He is been tolerating the dressing changes without complication. Fortunately there does not appear to be any evidence of infection at this time. He is still just using triple antibiotic ointment along with clobetasol occasionally over the area. He never got the Santyl and really does not seem to intend to in my pinion. 06/01/18 on evaluation today patient appears to be doing a little better in regard to his left lateral lower extremity ulcer. He states that overall he does not feel like he is doing as well with the Dapsone as he did with the prednisone. Nonetheless he sees his dermatologist later today and is gonna talk to them about the possibility of going back on the prednisone. Overall again I believe that the wound would be better if you would utilize Santyl but he really does not seem to be interested in going back to the [place] at this point. He has been using triple antibiotic ointment. 06/15/18 on evaluation today patient's wound actually appears to be doing about the same at this point. Fortunately there is no signs of infection at this time. He has made slight improvements although he continues to not really want to clean the wound bed at this point. He states that he just doesn't mess with it he doesn't want to cause any problems with everything else he has going on. He has been on medication, antibiotics  as prescribed by his dermatologist, for a staff infection of his lower extremities which is really drying out now and looking much better he tells me. Fortunately there is no sign of overall infection. 06/29/18 on evaluation today patient appears to be doing well in regard to his left lateral lower surety ulcer all things considering. Fortunately his staff infection seems to be greatly improved compared to previous. He has no signs of infection and this is drying up quite nicely. He is still the doxycycline for this is no longer on cental, Dapsone, or any of the other medications. His dermatologist has recommended possibility of an infusion but right now he does not want to proceed with that. 07/13/18 on evaluation today patient appears to be doing about the same in regard to his left lateral lower surety ulcer. Fortunately there's no signs of infection at this time which is great news. Unfortunately he still builds up a significant amount of Slough/biofilm of the surface of the wound he still is not really cleaning this as he should be appropriately. Again I'm able to easily with saline and gauze remove the majority of this on the surface which if you would do this at home would likely be a dramatic improvement for him as far as getting the area to improve. Nonetheless overall I still feel  like he is making progress is just very slow. I think Santyl will be of benefit for him as well. Still he has not gotten this as of this point. 07/27/18 on evaluation today patient actually appears to be doing little worse in regards of the erythema around the periwound region of the wound he also tells me that he's been having more drainage currently compared to what he was experiencing last time I saw him. He states not quite as bad as what he had because this was infected previously but nonetheless is still appears to be doing poorly. Fortunately there is no evidence of systemic infection at this point. The patient  tells me that he is not going to be able to afford the Santyl. He is still waiting to hear about the infusion therapy with his dermatologist. Apparently she wants an updated colonoscopy first. 08/10/18 on evaluation today patient appears to be doing better in regard to his left lateral lower extremity ulcer. Fortunately he is showing signs of improvement in this regard he's actually been approved for Remicade infusion's as well although this has not been scheduled as of yet. Fortunately there's no signs of active infection at this time in regard to the wound although he is having some issues with infection of the right lower extremity is been seen as dermatologist for this. Fortunately they are definitely still working with him trying to keep things under control. [name], [name] ([id number]) 09/07/18 on evaluation today patient is actually doing rather well in regard to his left lateral lower extremity ulcer. He notes these actually having some hair grow back on his extremity which is something he has not seen in years. He also tells me that the pain is really not giving them any trouble at this time which is also good news overall she is very pleased with the progress he's using a combination of the mupirocin along with the probate is all mixed. 09/21/18 on evaluation today patient actually appears to be doing fairly well all things considered in regard to his looks from the ulcer. He's been tolerating the dressing changes without complication. Fortunately there's no signs of active infection at this time which is good news he is still on all antibiotics or prevention of the staff infection. He has been on prednisone for time although he states it is gonna contact his dermatologist and see if she put them on a short course due to some irritation that he has going on currently. Fortunately there's no evidence of any overall worsening this is going very slow I think cental would be something that would be  helpful for him although he states that $50 for tube is quite expensive. He therefore is not willing to get that at this point. 10/06/18 on evaluation today patient actually appears to be doing decently well in regard to his left lateral leg ulcer. He's been tolerating the dressing changes without complication. Fortunately there's no signs of active infection at this time. Overall I'm actually rather pleased with the progress he's making although it's slow he doesn't show any signs of infection and he does seem to be making some improvement. I do believe that he may need a switch up and dressings to try to help this to heal more appropriately and quickly. 10/19/18 on evaluation today patient actually appears to be doing better in regard to his left lateral lower extremity ulcer. This is shown signs of having much less Slough buildup at this point due to the fact he has  been using the Santyl. Obviously this is very good news. The overall size of the wound is not dramatically smaller but again the appearance is. 11/02/18 on evaluation today patient actually appears to be doing quite well in regard to his lower [place] ulcer. A lot of the skin around the ulcer is actually somewhat irritating at this point this seems to be more due to the dressing causing irritation from the adhesive that anything else. Fortunately there is no signs of active infection at this time. 11/24/18 on evaluation today patient appears to be doing a little worse in regard to his overall appearance of his lower extremity ulcer. There's more erythema and warmth around the wound unfortunately. He is currently on doxycycline which he has been on for some time. With that being said I'm not sure that seems to be helping with what appears to possibly be an acute cellulitis with regard to his left lower extremity ulcer. No fevers, chills, nausea, or vomiting noted at this time. 12/08/18 on evaluation today patient's wounds actually appears to  be doing significantly better compared to his last evaluation. He has been using Santyl along with alternating tripling about appointment as well as the steroid cream seems to be doing quite well and the wound is showing signs of improvement which is excellent news. Fortunately there's no evidence of infection and in fact his culture came back negative with only normal skin flora noted. 12/21/2018 upon evaluation today patient actually appears to be doing excellent with regard to his ulcer. This is actually the best that I have seen it since have been helping to take care of him. It is both smaller as well as less slough noted on the surface of the wound and seems to be showing signs of good improvement with new skin growing from the edges. He has been using just the triamcinolone he does wonder if he can get a refill of that ointment today. 01/04/2019 upon evaluation today patient actually appears to be doing well with regard to his left lateral lower extremity ulcer. With that being said it does not appear to be that he is doing quite as well as last time as far as progression is concerned. There does not appear to be any signs of infection or significant irritation which is good news. With that being said I do believe that he may benefit from switching to a collagen based dressing based on how clean The wound appears. 01/18/2019 on evaluation today patient actually appears to be doing well with regard to his wound on the left lower extremity. He is not made a lot of progress compared to where we were previous but nonetheless does seem to be doing okay at this time which is good news. There is no signs of active infection which is also good news. My only concern currently is I do wish we can get him into utilizing the collagen dressing his insurance would not pay for the supplies that we ordered although it appears that he may be able to order this through his supply company that he typically utilizes.  This is [employer]. Nonetheless he did try to order it during the office visit today and it appears this did go through. We will see if he can get that it is a different brand but nonetheless he has collagen and I do think will be beneficial. 02/01/2019 on evaluation today patient actually appears to be doing a little worse today in regard to the overall size of his wounds.  Fortunately there is no signs of active infection at this time. That is visually. Nonetheless when this is happened before it was due to infection. For that reason were somewhat concerned about that this time as well. 02/08/2019 on evaluation today patient unfortunately appears to be doing slightly worse with regard to his wound upon evaluation today. Is measuring a little deeper and a little larger unfortunately. I am not really sure exactly what is causing this to enlarge he actually did see his dermatologist she is going to see about initiating Humira for him. Subsequently she also did do steroid injections into the wound itself in the periphery. Nonetheless still nonetheless he seems to be getting a little bit larger he is gone back to just using the steroid cream topically which I think is appropriate. I would say hold off on the collagen for the time being is definitely a good thing to do. Based on the culture results which we finally did get the final result back regarding it shows staph as the bacteria noted again that can be a normal skin bacteria based on the fact however he is having increased drainage and worsening of the wound measurement wise I would go ahead and place him on an antibiotic today I do believe for this. 02/15/2019 on evaluation today patient actually appears to be doing somewhat better in regard to his ulcer. There is no signs of worsening at this time I did review his culture results which showed evidence of Staphylococcus aureus but not MRSA. Again this could just be more related to the normal skin  bacteria although he states the drainage has slowed down quite a bit he may have had a mild infection not just colonization. And was much smaller and then since around10/04/2019 on evaluation today patient appears to be doing unfortunately worse as far as the size of the wound. I really feel like that this is steadily getting larger again it had been doing excellent right at the beginning of September we have seen a steady increase in the area of the wound it is almost 2-1/2 times the size it was on September 1. Obviously this is a bad trend this is not wanting to see. For that reason we went back to using just the topical triamcinolone cream which does seem to help with inflammation. I checked him for bacteria by way of culture and nothing showed positive there. I am considering giving him a short course of a tapering steroid Dosepak today to see if that is can be beneficial for him. The patient is in agreement with giving that a try. 03/08/2019 on evaluation today patient appears to be doing very well in comparison to last evaluation with regard to his lower extremity ulcer. This is showing signs of less inflammation and actually measuring slightly smaller compared to last time every other week over the past month and a half he has been measuring larger larger larger. Nonetheless I do believe that the issue has been inflammation the prednisone does seem to [facility] [place] ([id number]) have been beneficial for him which is good news. No fevers, chills, nausea, vomiting, or diarrhea. 03/22/2019 on evaluation today patient appears to be doing about the same with regard to his leg ulcer. He has been tolerating the dressing changes without complication. With that being said the wound seems to be mostly arrested at its current size but really is not making any progress except for when we prescribed the prednisone. He did show some signs of dropping as  far as the overall size of the wound during that interval  week. Nonetheless this is something he is not on long-term at this point and unfortunately I think he is getting need either this or else the Humira which his dermatologist has discussed try to get approval for. With that being said he will be seeing his dermatologist on the 11th of this month that is November. 04/19/2019 on evaluation today patient appears to be doing really about the same the wound is measuring slightly larger compared to last time I saw him. He has not been into the office since November 2 due to the fact that he unfortunately had Covid as that his entire family. He tells me that it was rough but they did pull-through and he seems to be doing much better. Fortunately there is no signs of active infection at this time. No fevers, chills, nausea, vomiting, or diarrhea. 05/10/2019 on evaluation today patient unfortunately appears to be doing significantly worse as compared to last time I saw him. He does tell me that he has had his first dose of Humira and actually is scheduled to get the next one in the upcoming week. With that being said he tells me also that in the past several days he has been having a lot of issues with green drainage she showed me a picture this is more blue-green in color. He is also been having issues with increased sloughy buildup and the wound does appear to be larger today. Obviously this is not the direction that we want everything to take based on the starting of his Humira. Nonetheless I think this is definitely a result of likely infection and to be honest I think this is probably Pseudomonas causing the infection based on what I am seeing. 05/24/2019 on evaluation today patient unfortunately appears to be doing significantly worse compared to his prior evaluation with me 2 [name] ago. I did review his culture results which showed that he does have Staph aureus as well as Pseudomonas noted on the culture. Nonetheless the Levaquin that I prescribed for him  does not appear to have been appropriate and in fact he tells me he is no longer experiencing the green drainage and discharge that he had at the last visit. Fortunately there is no signs of active infection at this time which is good news although the wound has significantly worsened it in fact is much deeper than it was previous. We have been utilizing up to this point triamcinolone ointment as the prescription topical of choice but at this time I really feel like that the wound is getting need to be packed in order to appropriately manage this due to the deeper nature of the wound. Therefore something along the lines of an alginate dressing may be more appropriate. 05/31/2019 upon inspection today patient's wound actually showed signs of doing poorly at this point. Unfortunately he just does not seem to be making any good progress despite what we have tried. He actually did go ahead and pick up the Cipro and start taking that as he was noticing more green drainage he had previously completed the Levaquin that I prescribed for him as well. Nonetheless he missed his appointment for the seventh last week on Wednesday with the wound care center and [facility] where his dermatologist referred him. Obviously I do think a second opinion would be helpful at this point especially in light of the fact that the patient seems to be doing so poorly despite  the fact that we have tried everything that I really know how at this point. The only thing that ever seems to have helped him in the past is when he was on high doses of continual steroids that did seem to make a difference for him. 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 06/07/2019 upon evaluation today patient unfortunately appears to be doing worse yet again with regard to his wound. In fact I  am starting to question whether or not he may have a fluid pocket in the muscle at this point based on the bulging and the soft appearance to the central portion of the muscle area. There is not anything draining from the muscle itself at this time which is good news but nonetheless the wound is expanding. I am not really seeing any results of the Humira as far as overall wound progression based on what I am seeing at this point. The patient has been referred for second opinion with regard to his wound to the [facility] wound care center by his dermatologist which I definitely am not in opposition to. Unfortunately we tried multiple dressings in the past including collagen, alginate, and at one point even Hydrofera Blue. With that being said he is never really used it for any significant amount of time due to the fact that he often complains of pain associated with these dressings and then will go back to either using the Santyl which she has done intermittently or more frequently the triamcinolone. He is also using his own compression stockings. We have wrapped him in the past but again that was something else that he really was not a big fan of. 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 He seemed to do much better when he was on the steroids although honestly I understand that the steroids are not necessarily the best medication to be on long-term obviously 06/14/2019 on evaluation today patient appears to be doing actually a little bit better with regard to the overall appearance with his leg. Unfortunately he does continue to have issues with what appears to be some fluid underneath  the muscle although he did see the wound specialty center at [facility] last week their main goals were to see about infusion therapy in place of the Humira as they feel like that is not quite strong enough. They also recommended that we continue with the treatment otherwise as we are they felt like that was appropriate and they are okay with him continuing to follow-up here with [place] in that regard. With that being said they are also sending him to the vein specialist there to see about vein stripping and if that would be of benefit for him. Subsequently they also did not really address whether or not an ultrasound of the muscle area to see if there is anything that needs to be addressed here would be appropriate or not. For that reason I discussed this with him last week I  think we may proceed down that road at this point. 06/21/2019 upon evaluation today patient's wound actually appears to be doing slightly better compared to previous evaluations. I do believe that he has made a difference with regard to the progression here with the use of oral steroids. Again in the past has been the only thing that is really calm things down. He does tell me that from [facility] is gotten a good news from there that there are no further vein stripping that is necessary at this point. I do not have that available for review today although the patient did relay this to me. He also did obtain and have the ultrasound of the wound completed which I did sign off on today. It does appear that there is no fluid collection under the muscle this is likely then just edematous tissue in general. That is also good news. Overall I still believe the inflammation is the main issue here. He did inquire about the possibility of a wound VAC again with the muscle protruding like it is I am not really sure whether the wound VAC is necessarily ideal or not. That is something we will have to consider although I do believe he may need compression  wrapping to try to help with edema control which could potentially be of benefit. 06/28/2019 on evaluation today patient appears to be doing slightly better measurement wise although this is not terribly smaller he least seems to be trending towards that direction. With that being said he still seems to have purulent drainage noted in the wound bed at this time. He has been on Levaquin followed by Cipro over the past month. Unfortunately he still seems to have some issues with active infection at this time. I did perform a culture last week in order to evaluate and see if indeed there was still anything going on. Subsequently the culture did come back showing Pseudomonas which is consistent with the drainage has been having which is blue-green in color. He also has had an odor that again was somewhat consistent with Pseudomonas as well. Long story short it appears that the culture showed an intermediate finding with regard to how well the Cipro will work for the Pseudomonas infection. Subsequently being that he does not seem to be clearing up and at best what we are doing is just keeping this at [place] I think he may need to see infectious disease to discuss IV antibiotic options. [name], [name] ([id number]) 07/05/2019 upon evaluation today patient appears to be doing okay in regard to his leg ulcer. He has been tolerating the dressing changes at this point without complication. Fortunately there is no signs of active infection at this time which is good news. No fevers, chills, nausea, vomiting, or diarrhea. With that being said he does have an appointment with infectious disease tomorrow and his primary care on Wednesday. Again the reason for the infectious disease referral was due to the fact that he did not seem to be fully resolving with the use of oral antibiotics and therefore we were thinking that IV antibiotic therapy may be necessary secondary to the fact that there was an intermediate finding for  how effective the Cipro may be. Nonetheless again he has been having a lot of purulent and even green drainage. Fortunately right now that seems to have calmed down over the past week with the reinitiation of the oral antibiotic. Nonetheless we will see what Dr. Megan Salon has to say. 07/12/2019 upon evaluation today patient appears  to be doing about the same at this point in regard to his left lower extremity ulcer. Fortunately there is no signs of active infection at this time which is good news I do believe the Levaquin has been beneficial I did review Dr. Hale Bogus note and to be honest I agree that the patient's leg does appear to be doing better currently. What we found in the past as he does not seem to really completely resolve he will stop the antibiotic and then subsequently things will revert back to having issues with blue-green drainage, increased pain, and overall worsening in general. Obviously that is the reason I sent him back to infectious disease. 07/19/2019 upon evaluation today patient appears to be doing roughly the same in size there is really no dramatic improvement. He has started back on the Levaquin at this point and though he seems to be doing okay he did still have a lot of blue/green drainage noted on evaluation today unfortunately. I think that this is still indicative more likely of a Pseudomonas infection as previously noted and again he does see Dr. Megan Salon in just a couple of days. I do not know that were really able to effectively clear this with just oral antibiotics alone based on what I am seeing currently. Nonetheless we are still continue to try to manage as best we can with regard to the patient and his wound. I do think the wrap was helpful in decreasing the edema which is excellent news. No fevers, chills, nausea, vomiting, or diarrhea. 07/26/2019 upon evaluation today patient appears to be doing slightly better with regard to the overall appearance of the muscle  there is no dark discoloration centrally. Fortunately there is no signs of active infection at this time. No fevers, chills, nausea, vomiting, or diarrhea. Patient's wound bed currently the patient did have an appointment with Dr. Megan Salon at infectious disease last week. With that being said Dr. Megan Salon the patient states was still somewhat hesitant about put him on any IV antibiotics he wanted [place] to repeat cultures today and then see where things go going forward. He does look like Dr. Megan Salon because of some improvement the patient did have with the Levaquin wanted [place] to see about repeating cultures. If it indeed grows the Pseudomonas again then he recommended a possibility of considering a PICC line placement and IV antibiotic therapy. He plans to see the patient back in 1 to 2 [name]. 08/02/2019 upon evaluation today patient appears to be doing poorly with regard to his left lower extremity. We did get the results of his culture back it shows that he is still showing evidence of Pseudomonas which is consistent with the purulent/blue-green drainage that he has currently. Subsequently the culture also shows that he now is showing resistance to the oral fluoroquinolones which is unfortunate as that was really the only thing to treat the infection prior. I do believe that he is looking like this is going require IV antibiotic therapy to get this under control. Fortunately there is no signs of systemic infection at this time which is good news. The patient does see Dr. Megan Salon tomorrow. 08/09/2019 upon evaluation today patient appears to be doing better with regard to his left lower extremity ulcer in regard to the overall appearance. He is currently on IV antibiotic therapy. As ordered by Dr. Megan Salon. Currently the patient is on ceftazidime which she is going to take for the next 2 [name] and then follow-up for 4 to 5-week appointment with  Dr. Megan Salon. The patient started this this past Friday  symptoms have not for a total of 3 days currently in full. 08/16/2019 upon evaluation today patient's wound actually does show muscle in the base of the wound but in general does appear to be much better as far as the overall evidence of infection is concerned. In fact I feel like this is for the most part cleared up he still on the IV antibiotics he has not completed the full course yet but I think he is doing much better which is excellent news. 08/23/2019 upon evaluation today patient appears to be doing about the same with regard to his wound at this point. He tells me that he still has pain unfortunately. Fortunately there is no evidence of systemic infection at this time which is great news. There is significant muscle protrusion. 09/13/19 upon evaluation today patient appears to be doing about the same in regard to his leg unfortunately. He still has a lot of drainage coming from the ulceration there is still muscle exposed. With that being said the patient's last wound culture still showed an intermediate finding with regard to the Pseudomonas he still having the bluish/green drainage as well. Overall I do not know that the wound has completely cleared of infection at this point. Fortunately there is no signs of active infection systemically at this point which is good news. 09/20/2019 upon evaluation today patient's wound actually appears to be doing about the same based on what I am seeing currently. I do not see any signs of systemic infection he still does have evidence of some local infection and drainage. He did see Dr. Megan Salon last week and Dr. Megan Salon states that he probably does need a different IV antibiotic although he does not want to put him on this until the patient begins the Remicade infusion which is actually scheduled for about 10 days out from today on 13 May. Following that time Dr. Megan Salon is good to see him back and then will evaluate the feasibility of starting him on the  IV antibiotic therapy once again at that point. I do not disagree with this plan I do believe as Dr. Megan Salon stated in his note that I reviewed today that the patient's issue is multifactorial with the pyoderma being 1 aspect of this that were hoping the Remicade will be helpful for her. In the meantime I think the gentamicin is, helping to keep things under decent okay control in regard to the ulcer. 09/27/2019 upon evaluation today patient appears to be doing about the same with regard to his wound still there is a lot of muscle exposure though he does have some hyper granulation tissue noted around the edge and actually some granulation tissue starting to form over the muscle which is actually good news. Fortunately there is no evidence of active infection which is also good news. His pain is less at this point. 5/21; this is a patient I have not seen in a long time. 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 He has a large wound on the left lateral leg with protruding muscle. He comes in the clinic today showing the same area on his left medial ankle. He says there is been a spot there for some time although we have not previously defined this. Today he has a clearly defined area with slight amount of skin breakdown surrounded by raised areas with a purplish hue in color. This is not painful he says it  is irritated. 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 He still has muscle protruding from the lateral portion of his left leg but fortunately the new area that they were concerned about at his last visit does not appear to have opened at this point. He is currently on Remicade infusions and seems to be doing better in my opinion in fact the wound itself seems to be overall much better. The purplish discoloration that he did have seems to have resolved  and I think that is a good sign that hopefully the Remicade is doing its job. He does have some biofilm noted over the surface of the wound. 11/01/2019 on evaluation today patient's wound actually appears to be doing excellent at this time. Fortunately there is no evidence of active infection and overall I feel like he is making great progress. The Remicade seems to be due excellent job in my opinion. [name], [name] ([id number]) 11/08/19 evaluation today vision actually appears to be doing quite well with regard to his weight ulcer. He's been tolerating dressing changes without complication. Fortunately there is no evidence of infection. No fevers, chills, nausea, or vomiting noted at this time. Overall states that is having more itching than pain which is actually a good sign in my opinion. Electronic Signature(s) Signed: 11/08/2019 4:16:09 PM By: Worthy Keeler PA-C Entered By: Worthy [name] on 11/08/2019 09:49:13 [name], [name] ([id number]) -------------------------------------------------------------------------------- Physical Exam Details Patient Name: [name] Date of Service: 11/08/2019 8:15 AM Medical Record Number: [id number] Patient Account Number: [id number] Date of Birth/Sex: Oct 03, 1980 (39 y.o. M) Treating RN: [name] Primary Care Provider: Alma [name] Other Clinician: Referring Provider: Alma [name] Treating Provider/Extender: [name], Copelyn Widmer [name] in Treatment: 57 Constitutional Well-nourished and well-hydrated in no acute distress. Respiratory normal breathing without difficulty. Psychiatric this patient is able to make decisions and demonstrates good insight into disease process. Alert and Oriented x 3. pleasant and cooperative. Notes Patients were in bed actually showed signs of good healing at this point and I see no signs of infection which is also great news. There is no significant slot buildup noted on the wound bed. Electronic Signature(s) Signed:  11/08/2019 4:16:09 PM By: Worthy Keeler PA-C Entered By: Worthy [name] on 11/08/2019 09:54:06 Tinnell, [name] ([id number]) -------------------------------------------------------------------------------- Physician Orders Details Patient Name: [name] Date of Service: 11/08/2019 8:15 AM Medical Record Number: [id number] Patient Account Number: [id number] Date of Birth/Sex: November 26, 1980 (39 y.o. 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 Anesthetic (add to Medication List) Wound #1 Left,Lateral Lower Leg o Topical Lidocaine 4% cream applied to wound bed prior to debridement (In Clinic Only). Primary Wound Dressing o Gentamicin Sulfate Cream - mix 1:1 with triamcinolone to apply to the wound bed Secondary Dressing Wound #1 Left,Lateral Lower Leg o Boardered Foam Dressing Dressing Change Frequency Wound #1 Left,Lateral Lower Leg o Change dressing every day. Follow-up Appointments Wound #1 Left,Lateral Lower Leg o Return Appointment in 1 week. Edema Control Wound #  1 Left,Lateral Lower Leg o Patient to wear own compression stockings o Elevate legs to the level of the heart and pump ankles as often as possible Electronic Signature(s) Signed: 11/08/2019 12:01:31 PM By: [name], [name], RN, [name], [name], [name] Signed: 11/08/2019 4:16:09 PM By: Worthy Keeler PA-C Entered By: [name], [name], RN, [name], [name] on 11/08/2019  12:01:24 DECLIN, RAJAN ([id number]) -------------------------------------------------------------------------------- Problem List Details Patient Name: [name] Date of Service: 11/08/2019 8:15 AM Medical Record Number: [id number] Patient Account Number: [id number] Date of Birth/Sex: [birth year] (39 y.o. M) Treating RN: [name] Primary Care Provider: Alma [name] Other Clinician: Referring Provider: Alma [name] Treating Provider/Extender: [name], Ryana Montecalvo [name] in Treatment: 152 Active Problems ICD-10 Encounter Code Description Active Date MDM Diagnosis L97.222 Non-pressure chronic ulcer of left calf with fat layer exposed 12/04/2016 No Yes L88 Pyoderma gangrenosum 03/26/2017 No Yes L97.321 Non-pressure chronic ulcer of left ankle limited to breakdown of skin 10/08/2019 No Yes I87.2 Venous insufficiency (chronic) (peripheral) 12/04/2016 No Yes L03.116 Cellulitis of left lower limb 05/24/2019 No Yes Inactive Problems ICD-10 Code Description Active Date Inactive Date L97.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 M) Treating RN: [name] Primary Care Provider: Alma [name] Other Clinician: Referring Provider: Alma [name] Treating Provider/Extender: [name], Jayci Ellefson [name] in Treatment: 152 Subjective Chief Complaint Information obtained from Patient He is here in follow up evaluation for LLE pyoderma ulcer History of Present Illness (HPI) 12/04/16; 39 year old man who comes  into the clinic today for review of a wound on the posterior left calf. He tells me that is been there for about a year. He is not a diabetic he does smoke half a pack per day. He was seen in the ER on 11/20/16 felt to have cellulitis around the wound and was given clindamycin. An x-ray did not show osteomyelitis. The patient initially tells me that he has a milk allergy that sets off a pruritic itching rash on his lower legs which she scratches incessantly and he thinks that's what may have set up the wound. He has been using various topical antibiotics and ointments without any effect. He works in a [employer] and is on his feet all day. He does not have a prior history of wounds however he does have the rash on both lower legs the right arm and the ventral aspect of his left arm. These are excoriations and clearly have had scratching however there are of macular looking areas on both legs including a substantial larger area on the right leg. This does not have an underlying open area. There is no blistering. The patient tells me that 2 years ago in [place] in response to the rash on his legs he saw a dermatologist who told him he had a condition which may be pyoderma gangrenosum although I may be putting words into his mouth. He seemed to recognize this. 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 He is not in any treatment for this. He's had no recent travel 12/11/16; the patient arrives today with his wound and roughly the same condition we've been using silver alginate this is a deep punched out  wound with some surrounding erythema but no tenderness. Biopsy I did did not show confirmed pyoderma gangrenosum suggested nonspecific inflammation and vasculitis but does not provide an actual description of what was seen by the pathologist. I'm really not able to understand this We have also received information from the patient's dermatologist in [place] notes from  April 2016. This was a doctor Agarwal-antal. 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 He was prescribed topical steroid high potency under occlusion which helped but at this point the patient did not have a deep punched out wound. 12/18/16; the patient's wound is larger in terms of surface area however this surface looks better and there is less depth. The surrounding erythema also is better. The patient states that the wrap we put on came off 2 days ago when he has been using his compression stockings. He we are in the process of getting a dermatology consult. 12/26/16 on evaluation today patient's left lower extremity wound shows evidence of infection with surrounding erythema noted. He has been tolerating the dressing changes but states that he has noted more discomfort. There is a larger area of erythema surrounding the wound. No fevers, chills, nausea, or vomiting noted at this time. With that being said the wound still does have slough covering the surface. He is not allergic to any medication that he is aware of at this point. In regard to his right lower extremity he had several regions that are erythematous and pruritic he wonders if there's anything we can do to help that. 01/02/17 I reviewed patient's wound culture which was obtained his visit last week. He was placed on doxycycline at that point. Unfortunately that does not appear to be an antibiotic that would likely help with the situation however the pseudomonas noted on culture is sensitive to Cipro. Also unfortunately patient's wound seems to have a large compared to last week's evaluation. Not severely so but there are definitely increased measurements in general. He is continuing to have discomfort as well he writes this to be a seven out of 10. In fact he would prefer me not to perform any debridement today due to the fact that he is having discomfort and considering he has an active infection on the little reluctant  to do so anyway. No fevers, chills, nausea, or vomiting noted at this time. 01/08/17; patient seems dermatology on September 5. I suspect dermatology will want the slides from the biopsy I did sent to their pathologist. I'm not sure if there is a way we can expedite that. In any case the culture I did before I left on vacation 3 [name] ago showed Pseudomonas he was given 10 days of Cipro and per her description of her intake nurses is actually somewhat better this week although the wound is quite a bit bigger than I remember the last time I saw this. He still has 3 more days of Cipro 01/21/17; dermatology appointment tomorrow. He has completed the ciprofloxacin for Pseudomonas. Surface of the wound looks better however he is had some deterioration in the lesions on his right leg. Meantime the left lateral leg wound we will continue with sample 01/29/17; patient had his dermatology appointment but I can't yet see that note. He is completed his antibiotics. The wound is more superficial but considerably larger in circumferential area than when he came in. This is in his left lateral calf. He also has swollen erythematous areas with superficial wounds on the right leg and small papular areas on  both arms. There apparently areas in her his upper thighs and buttocks I did not look at those. Dermatology biopsied the right leg. Hopefully will have their input next week. 02/05/17; patient went back to see his dermatologist who told him that he had a "scratching problem" as well as staph. He is now on a 30 day course of doxycycline and I believe she gave him triamcinolone cream to the right leg areas to help with the itching [not exactly sure but probably triamcinolone]. She apparently looked at the left lateral leg wound although this was not rebiopsied and I think felt to be ultimately part of the same pathogenesis. He is using sample border foam and changing nevus himself. He now has a new open area on the right  posterior leg which was his biopsy site I don't have any of the dermatology notes 02/12/17; we put the patient in compression last week with SANTYL to the wound on the left leg and the biopsy. Edema is much better and the depth of the wound is now at level of skin. Area is still the same Biopsy site on the right lateral leg we've also been using santyl with a border foam dressing and he is changing this himself. 02/19/17; Using silver alginate started last week to both the substantial left leg wound and the biopsy site on the right wound. He is tolerating compression well. Has a an appointment with his primary M.D. tomorrow wondering about diuretics although I'm wondering if the edema problem is actually lymphedema [name], [name] ([id number]) 02/26/17; the patient has been to see his primary doctor Dr. Jerrel Ivory at [place] our primary care. She started him on Lasix 20 mg and this seems to have helped with the edema. However we are not making substantial change with the left lateral calf wound and inflammation. The biopsy site on the right leg also looks stable but not really all that different. 03/12/17; the patient has been to see vein and vascular Dr. Lucky Cowboy. He has had venous reflux studies I have not reviewed these. I did get a call from his dermatology office. 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 They therefore recommended clobetasol and tetracycline i.e. 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 03/26/17; apparently the patient just had reflux studies not an appointment with Dr. dew. She arrives in clinic today having applied clobetasol for 2-3 [name]. He notes over the last 2-3 days excessive drainage having to  change the dressing 3-4 times a day and also expanding erythema. 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 Lillia Carmel at [facility] dermatology locally who kindly saw him at my request. 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 My concern was the degree of erythema medially extending from his left leg wound which was either inflammation from pyoderma or cellulitis. I put him on Augmentin however culture of the wound showed Pseudomonas which is quinolone sensitive. I really don't believe he  has cellulitis however in view of everything I will continue and give him a course of Cipro. He is also on doxycycline as an immune modulator for the pyoderma. In addition to his original wound on the left lateral leg with surrounding erythema he has a wound on the right posterior calf which was an original biopsy site done by dermatology. 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 Saw Dr. Lillia Carmel yesterday. He has been using topical antibiotics to both wound areas his original wound on the left and the biopsies/pathergy area on the right. There is definitely some improvement in the inflammation around the wound on the right although the patient states he has increasing sensitivity of the wounds. He is on prednisone 60 and doxycycline 1 as prescribed by Dr. Lillia Carmel. He is covering the topical antibiotic with gauze and putting this in his own compression stocks and changing this daily. He states that Dr. Lottie Rater did a culture of the left leg wound yesterday 05/07/17; pyoderma gangrenosum. The patient saw Dr. Lillia Carmel yesterday and has a follow-up  with her in one month. He is still using topical antibiotics to both wounds although he can't recall exactly what type. He is still on prednisone 60 mg. Dr. Lillia Carmel stated that the doxycycline could stop if we were in agreement. 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 The right medial leg was induced by biopsy/pathergy. The area on the right is essentially healed. 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 The right medial leg has closed and remains closed. He is still on prednisone 60. He tells me he missed his last dermatology appointment with Dr. Lillia Carmel but will make another appointment. He reports that her blood sugar at a recent screen in [place] was high 200's. He was 180 today. 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 I think he is going to require still much longer prednisone perhaps another 3 months before attempting to taper. In the meantime his wound is a lot better. Smaller. He is cleaning this off daily and applying topical antibiotics. When he was last in the clinic I thought about changing to [facility] and actually put in a couple of calls to dermatology although probably not during their business hours. 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 He continues on oral prednisone. He has been using triple antibiotic ointment. There is surface debris and we will transition to [facility] and have him return in 2 [name]. He has lost 30 pounds since his last appointment with lifestyle modification. He may benefit from topical steroid cream for treatment this can be considered at a later date. 08/22/17 on evaluation today patient appears to actually be doing  rather well in regard to his left lateral lower extremity ulcer. He has actually been managed by Dr. Dellia Nims most recently. Patient is currently on oral steroids at this time. This seems to have been of benefit for him. Nonetheless his last visit was actually with Leah on 08/06/17. Currently he is not utilizing any topical steroid creams although this could be of benefit as well. No fevers, chills, nausea, or vomiting noted at this time. 09/05/17 on evaluation today patient  appears to be doing better in regard to his left lateral lower extremity ulcer. He has been tolerating the dressing changes without complication. He is using Santyl with good effect. Overall I'm very pleased with how things are standing at this point. Patient likewise is happy that this is doing better. 09/19/17 on evaluation today patient actually appears to be doing rather well in regard to his left lateral lower extremity ulcer. 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 He's not having any significant pain. 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 He has been tolerating the Santyl without complication and in general I feel like he's making good progress. 10/17/17 on evaluation today patient appears to be doing very well in regard to his left lateral lower surety ulcer. He has been tolerating the dressing changes without complication. There does not appear to be any evidence of infection he's alternating the Santyl and the triple antibiotic ointment every other day this seems to be doing well for him. 11/03/17 on evaluation today patient appears to be doing very well in regard to his left lateral lower extremity ulcer. He is been tolerating the dressing changes without complication which is good news. Fortunately there does not appear to be any  evidence of infection which is also great news. Overall is doing excellent they are starting to taper down on the prednisone is down to 40 mg at this point it also started topical clobetasol for him. 11/17/17 on evaluation today patient appears to be doing well in regard to his left lateral lower surety ulcer. He's been tolerating the dressing changes without complication. He does note that he is having no pain, no excessive drainage or discharge, and overall he feels like things are going about how he would expect and hope they would. Overall he seems to have no evidence of infection at this time in my opinion which is good news. 12/04/17-He is seen in follow-up evaluation for right lateral lower extremity ulcer. He has been applying topical steroid cream. Today's measurement show slight increase in size. Over the next 2 [name] we will transition to every other day Santyl and steroid cream. He has been encouraged to monitor for changes and notify clinic with any concerns 12/15/17 on evaluation today patient's left lateral motion the ulcer and fortunately is doing worse again at this point. This just since last week to this week has close to doubled in size according to the patient. I did not seeing last week's I do not have a visual to compare this to in our system was also down so we do not have all the charts and at this point. Nonetheless it does have me somewhat concerned in regard to the fact that again he was worried enough about it he has contact the dermatology that placed them back on the full strength, 50 mg a day of the prednisone that he was taken previous. He continues to alternate using clobetasol along with Santyl at this point. He is obviously somewhat frustrated. [name], [name] ([id number]) 12/22/17 on evaluation today patient appears to be doing a little worse compared to last evaluation. Unfortunately the wound is a little deeper and slightly larger than the last week's evaluation. With  that being said he has made some progress in regard to the irritation surrounding at this time unfortunately despite that  progress that's been made he still has a significant issue going on here. 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 For that reason I discussed with him today the possibility of perform a wound culture to ensure there's nothing overtly infected. 01/06/18 on evaluation today patient's wound is larger and deeper than previously evaluated. With that being said it did appear that his wound was infected after my last evaluation with him. Subsequently I did end up prescribing a prescription for Bactrim DS which she has been taking and having no complication with. Fortunately there does not appear to be any evidence of infection at this point in time as far as anything spreading, no want to touch, and overall I feel like things are showing signs of improvement. 01/13/18 on evaluation today patient appears to be even a little larger and deeper than last time. There still muscle exposed in the base of the wound. Nonetheless he does appear to be less erythematous I do believe inflammation is calming down also believe the infection looks like it's probably resolved at this time based on what I'm seeing. No fevers, chills, nausea, or vomiting noted at this time. 01/30/18 on evaluation today patient actually appears to visually look better for the most part. Unfortunately those visually this looks better he does seem to potentially have what may be an abscess in the muscle that has been noted in the central portion of the wound. This is the first time that I have noted what appears to be fluctuance in the central portion of the muscle. With that being said I'm somewhat more concerned about the fact that this might indicate an abscess formation at  this location. I do believe that an ultrasound would be appropriate. This is likely something we need to try to do as soon as possible. He has been switch to mupirocin ointment and he is no longer using the steroid ointment as prescribed by dermatology he sees them again next week he's been decreased from 60 to 40 mg of prednisone. 03/09/18 on evaluation today patient actually appears to be doing a little better compared to last time I saw him. There's not as much erythema surrounding the wound itself. He I did review his most recent infectious disease note which was dated 02/24/18. He saw Dr. Michel Bickers in [place]. 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 Patient is now off of antibiotics and they are continually observing this. There seems to be no change in the past two [name] in my pinion based on what the patient says and what I see today compared to what Dr. Megan Salon likely saw two [name] ago. No fevers, chills, nausea, or vomiting noted at this time. 03/23/18 on evaluation today patient's wound actually appears to be showing signs of improvement which is good news. He is currently still on the Dapsone. He is also working on tapering the prednisone to get off of this and Dr. Lottie Rater is working with him in this regard. Nonetheless overall I feel like the wound is doing well it does appear based on the infectious disease note that I reviewed from Dr. 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 I did also review the results of his ultrasound of left lower extremity which revealed there was a dentist tissue in the base of the wound without an abscess noted. 04/06/18 on evaluation today the patient's left lateral lower extremity ulcer actually appears to be doing fairly well which is excellent  news. There does not appear to be any evidence of infection at this time which is also great news. Overall he still does have a significantly large ulceration although little by little he seems to be making progress. He is down to 10 mg a day of the prednisone. 04/20/18 on evaluation today patient actually appears to be doing excellent at this time in regard to his left lower extremity ulcer. He's making signs of good progress unfortunately this is taking much longer than we would really like to see but nonetheless he is making progress. Fortunately there does not appear to be any evidence of infection at this time. No fevers, chills, nausea, or vomiting noted at this time. The patient has not been using the Santyl due to the cost he hadn't got in this field yet. He's mainly been using the antibiotic ointment topically. Subsequently he also tells me that he really has not been scrubbing in the shower I think this would be helpful again as I told him it doesn't have to be anything too aggressive to even make it believe just enough to keep it free of some of the loose slough and biofilm on the wound surface. 05/11/18 on evaluation today patient's wound appears to be making slow but sure progress in regard to the left lateral lower extremity ulcer. He is been tolerating the dressing changes without complication. Fortunately there does not appear to be any evidence of infection at this time. He is still just using triple antibiotic ointment along with clobetasol occasionally over the area. He never got the Santyl and really does not seem to intend to in my pinion. 06/01/18 on evaluation today patient appears to be doing a little better in regard to his left lateral lower extremity ulcer. He states that overall he does not feel like he is doing as well with the Dapsone as he did with the prednisone. Nonetheless he sees his dermatologist later today and is gonna talk to them about the possibility of going  back on the prednisone. Overall again I believe that the wound would be better if you would utilize Santyl but he really does not seem to be interested in going back to the [place] at this point. He has been using triple antibiotic ointment. 06/15/18 on evaluation today patient's wound actually appears to be doing about the same at this point. Fortunately there is no signs of infection at this time. He has made slight improvements although he continues to not really want to clean the wound bed at this point. He states that he just doesn't mess with it he doesn't want to cause any problems with everything else he has going on. He has been on medication, antibiotics as prescribed by his dermatologist, for a staff infection of his lower extremities which is really drying out now and looking much better he tells me. Fortunately there is no sign of overall infection. 06/29/18 on evaluation today patient appears to be doing well in regard to his left lateral lower surety ulcer all things considering. Fortunately his staff infection seems to be greatly improved compared to previous. He has no signs of infection and this is drying up quite nicely. He is  still the doxycycline for this is no longer on cental, Dapsone, or any of the other medications. His dermatologist has recommended possibility of an infusion but right now he does not want to proceed with that. 07/13/18 on evaluation today patient appears to be doing about the same in regard to his left lateral lower surety ulcer. Fortunately there's no signs of infection at this time which is great news. Unfortunately he still builds up a significant amount of Slough/biofilm of the surface of the wound he still is not really cleaning this as he should be appropriately. Again I'm able to easily with saline and gauze remove the majority of this on the surface which if you would do this at home would likely be a dramatic improvement for him as far as getting the area  to improve. Nonetheless overall I still feel like he is making progress is just very slow. I think Santyl will be of benefit for him as well. Still he has not gotten this as of this point. 07/27/18 on evaluation today patient actually appears to be doing little worse in regards of the erythema around the periwound region of the wound he also tells me that he's been having more drainage currently compared to what he was experiencing last time I saw him. He states not quite as bad as what he had because this was infected previously but nonetheless is still appears to be doing poorly. Fortunately there is no evidence of systemic infection at this point. The patient tells me that he is not going to be able to afford the Santyl. He is still waiting to hear about the infusion therapy with his dermatologist. Apparently she wants an updated colonoscopy first. [name], [name] ([id number]) 08/10/18 on evaluation today patient appears to be doing better in regard to his left lateral lower extremity ulcer. Fortunately he is showing signs of improvement in this regard he's actually been approved for Remicade infusion's as well although this has not been scheduled as of yet. Fortunately there's no signs of active infection at this time in regard to the wound although he is having some issues with infection of the right lower extremity is been seen as dermatologist for this. Fortunately they are definitely still working with him trying to keep things under control. 09/07/18 on evaluation today patient is actually doing rather well in regard to his left lateral lower extremity ulcer. He notes these actually having some hair grow back on his extremity which is something he has not seen in years. He also tells me that the pain is really not giving them any trouble at this time which is also good news overall she is very pleased with the progress he's using a combination of the mupirocin along with the probate is all  mixed. 09/21/18 on evaluation today patient actually appears to be doing fairly well all things considered in regard to his looks from the ulcer. He's been tolerating the dressing changes without complication. Fortunately there's no signs of active infection at this time which is good news he is still on all antibiotics or prevention of the staff infection. He has been on prednisone for time although he states it is gonna contact his dermatologist and see if she put them on a short course due to some irritation that he has going on currently. Fortunately there's no evidence of any overall worsening this is going very slow I think cental would be something that would be helpful for him although he states that $50 for  tube is quite expensive. He therefore is not willing to get that at this point. 10/06/18 on evaluation today patient actually appears to be doing decently well in regard to his left lateral leg ulcer. He's been tolerating the dressing changes without complication. Fortunately there's no signs of active infection at this time. Overall I'm actually rather pleased with the progress he's making although it's slow he doesn't show any signs of infection and he does seem to be making some improvement. I do believe that he may need a switch up and dressings to try to help this to heal more appropriately and quickly. 10/19/18 on evaluation today patient actually appears to be doing better in regard to his left lateral lower extremity ulcer. This is shown signs of having much less Slough buildup at this point due to the fact he has been using the [employer]. Obviously this is very good news. The overall size of the wound is not dramatically smaller but again the appearance is. 11/02/18 on evaluation today patient actually appears to be doing quite well in regard to his lower [place] ulcer. A lot of the skin around the ulcer is actually somewhat irritating at this point this seems to be more due to the  dressing causing irritation from the adhesive that anything else. Fortunately there is no signs of active infection at this time. 11/24/18 on evaluation today patient appears to be doing a little worse in regard to his overall appearance of his lower extremity ulcer. There's more erythema and warmth around the wound unfortunately. He is currently on doxycycline which he has been on for some time. With that being said I'm not sure that seems to be helping with what appears to possibly be an acute cellulitis with regard to his left lower extremity ulcer. No fevers, chills, nausea, or vomiting noted at this time. 12/08/18 on evaluation today patient's wounds actually appears to be doing significantly better compared to his last evaluation. He has been using Santyl along with alternating tripling about appointment as well as the steroid cream seems to be doing quite well and the wound is showing signs of improvement which is excellent news. Fortunately there's no evidence of infection and in fact his culture came back negative with only normal skin flora noted. 12/21/2018 upon evaluation today patient actually appears to be doing excellent with regard to his ulcer. This is actually the best that I have seen it since have been helping to take care of him. It is both smaller as well as less slough noted on the surface of the wound and seems to be showing signs of good improvement with new skin growing from the edges. He has been using just the triamcinolone he does wonder if he can get a refill of that ointment today. 01/04/2019 upon evaluation today patient actually appears to be doing well with regard to his left lateral lower extremity ulcer. With that being said it does not appear to be that he is doing quite as well as last time as far as progression is concerned. There does not appear to be any signs of infection or significant irritation which is good news. With that being said I do believe that he may  benefit from switching to a collagen based dressing based on how clean The wound appears. 01/18/2019 on evaluation today patient actually appears to be doing well with regard to his wound on the left lower extremity. He is not made a lot of progress compared to where we were previous  but nonetheless does seem to be doing okay at this time which is good news. There is no signs of active infection which is also good news. My only concern currently is I do wish we can get him into utilizing the collagen dressing his insurance would not pay for the supplies that we ordered although it appears that he may be able to order this through his supply company that he typically utilizes. This is [employer]. Nonetheless he did try to order it during the office visit today and it appears this did go through. We will see if he can get that it is a different brand but nonetheless he has collagen and I do think will be beneficial. 02/01/2019 on evaluation today patient actually appears to be doing a little worse today in regard to the overall size of his wounds. Fortunately there is no signs of active infection at this time. That is visually. Nonetheless when this is happened before it was due to infection. For that reason were somewhat concerned about that this time as well. 02/08/2019 on evaluation today patient unfortunately appears to be doing slightly worse with regard to his wound upon evaluation today. Is measuring a little deeper and a little larger unfortunately. I am not really sure exactly what is causing this to enlarge he actually did see his dermatologist she is going to see about initiating Humira for him. Subsequently she also did do steroid injections into the wound itself in the periphery. Nonetheless still nonetheless he seems to be getting a little bit larger he is gone back to just using the steroid cream topically which I think is appropriate. I would say hold off on the collagen for the time being is  definitely a good thing to do. Based on the culture results which we finally did get the final result back regarding it shows staph as the bacteria noted again that can be a normal skin bacteria based on the fact however he is having increased drainage and worsening of the wound measurement wise I would go ahead and place him on an antibiotic today I do believe for this. 02/15/2019 on evaluation today patient actually appears to be doing somewhat better in regard to his ulcer. There is no signs of worsening at this time I did review his culture results which showed evidence of Staphylococcus aureus but not MRSA. Again this could just be more related to the normal skin bacteria although he states the drainage has slowed down quite a bit he may have had a mild infection not just colonization. And was much smaller and then since around10/04/2019 on evaluation today patient appears to be doing unfortunately worse as far as the size of the wound. I really feel like that this is steadily getting larger again it had been doing excellent right at the beginning of September we have seen a steady increase in the area of the wound it is almost 2-1/2 times the size it was on September 1. Obviously this is a bad trend this is not wanting to see. For that reason we went back to using just the topical triamcinolone cream which does seem to help with inflammation. I checked him for bacteria by way of culture and nothing showed positive there. I am considering giving him a short course of a tapering steroid [name], [name] ([id number]) today to see if that is can be beneficial for him. The patient is in agreement with giving that a try. 03/08/2019 on evaluation today patient appears to  be doing very well in comparison to last evaluation with regard to his lower extremity ulcer. This is showing signs of less inflammation and actually measuring slightly smaller compared to last time every other week over the past  month and a half he has been measuring larger larger larger. Nonetheless I do believe that the issue has been inflammation the prednisone does seem to have been beneficial for him which is good news. No fevers, chills, nausea, vomiting, or diarrhea. 03/22/2019 on evaluation today patient appears to be doing about the same with regard to his leg ulcer. He has been tolerating the dressing changes without complication. With that being said the wound seems to be mostly arrested at its current size but really is not making any progress except for when we prescribed the prednisone. He did show some signs of dropping as far as the overall size of the wound during that interval week. Nonetheless this is something he is not on long-term at this point and unfortunately I think he is getting need either this or else the Humira which his dermatologist has discussed try to get approval for. With that being said he will be seeing his dermatologist on the 11th of this month that is November. 04/19/2019 on evaluation today patient appears to be doing really about the same the wound is measuring slightly larger compared to last time I saw him. He has not been into the office since November 2 due to the fact that he unfortunately had Covid as that his entire family. He tells me that it was rough but they did pull-through and he seems to be doing much better. Fortunately there is no signs of active infection at this time. No fevers, chills, nausea, vomiting, or diarrhea. 05/10/2019 on evaluation today patient unfortunately appears to be doing significantly worse as compared to last time I saw him. He does tell me that he has had his first dose of Humira and actually is scheduled to get the next one in the upcoming week. With that being said he tells me also that in the past several days he has been having a lot of issues with green drainage she showed me a picture this is more blue-green in color. He is also been having  issues with increased sloughy buildup and the wound does appear to be larger today. Obviously this is not the direction that we want everything to take based on the starting of his Humira. Nonetheless I think this is definitely a result of likely infection and to be honest I think this is probably Pseudomonas causing the infection based on what I am seeing. 05/24/2019 on evaluation today patient unfortunately appears to be doing significantly worse compared to his prior evaluation with me 2 [name] ago. I did review his culture results which showed that he does have Staph aureus as well as Pseudomonas noted on the culture. Nonetheless the Levaquin that I prescribed for him does not appear to have been appropriate and in fact he tells me he is no longer experiencing the green drainage and discharge that he had at the last visit. Fortunately there is no signs of active infection at this time which is good news although the wound has significantly worsened it in fact is much deeper than it was previous. We have been utilizing up to this point triamcinolone ointment as the prescription topical of choice but at this time I really feel like that the wound is getting need to be packed in order to appropriately  manage this due to the deeper nature of the wound. Therefore something along the lines of an alginate dressing may be more appropriate. 05/31/2019 upon inspection today patient's wound actually showed signs of doing poorly at this point. Unfortunately he just does not seem to be making any good progress despite what we have tried. He actually did go ahead and pick up the Cipro and start taking that as he was noticing more green drainage he had previously completed the Levaquin that I prescribed for him as well. Nonetheless he missed his appointment for the seventh last week on Wednesday with the wound care center and [facility] where his dermatologist referred him. Obviously I do think a second opinion  would be helpful at this point especially in light of the fact that the patient seems to be doing so poorly despite the fact that we have tried everything that I really know how at this point. The only thing that ever seems to have helped him in the past is when he was on high doses of continual steroids that did seem to make a difference for him. 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 06/07/2019 upon evaluation today patient unfortunately appears to be doing worse yet again with regard to his wound. In fact I am starting to question whether or not he may have a fluid pocket in the muscle at this point based on the bulging and the soft appearance to the central portion of the muscle area. There is not anything draining from the muscle itself at this time which is good news but nonetheless the wound is expanding. I am not really seeing any results of the Humira as far as overall wound progression based on what I am seeing at this point. The patient has been referred for second opinion with regard to his wound to the [facility] wound care center by his dermatologist which I definitely am not in opposition to. Unfortunately we tried multiple dressings in the past including collagen, alginate, and at one point even Hydrofera Blue. With that being said he is never really used it for any significant amount of time due to the fact that he often complains of pain associated with these dressings and then will go back to either using the Santyl which she has done intermittently or more frequently the triamcinolone. He is also using his own compression stockings. We have wrapped him in the past but again that was something else that he really was not a big fan of. 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 He seemed to do much better when he was on the steroids although honestly I understand that the steroids are not necessarily the best medication to be on long-term obviously 06/14/2019 on evaluation today patient appears to be doing actually a little bit better with regard to the overall appearance with his leg. Unfortunately he does continue to have issues with what appears to be some fluid underneath  the muscle although he did see the wound specialty center at [facility] last week their main goals were to see about infusion therapy in place of the Humira as they feel like that is not quite strong enough. They also recommended that we continue with the treatment otherwise as we are they felt like that was appropriate and they are okay with him continuing to follow-up here with [place] in that regard. With that being said they are also sending him to the vein specialist there to see about vein stripping and if that would be of benefit for him. Subsequently they also did not really address whether or not an ultrasound of the muscle area to see if there is anything that needs to be addressed here would be appropriate or not. For that reason I discussed this with him last week I think we may proceed down that road at this point. 06/21/2019 upon evaluation today patient's wound actually appears to be doing slightly better compared to previous evaluations. I do believe that he has made a difference with regard to the progression here with the use of oral steroids. Again in the past has been the only thing that is really calm things down. He does tell me that from [facility] is gotten a good news from there that there are no further vein stripping that is  necessary at this point. I do not have that available for review today although the patient did relay this to me. He also did obtain and have the ultrasound of the wound completed which I did sign off on today. It does appear that there is no fluid collection under the muscle this is likely then just edematous tissue in general. That is also good news. Overall I still believe the inflammation is the main issue here. He did inquire about the possibility of a wound VAC again with the muscle protruding like it is I am not really sure whether the wound VAC is necessarily ideal or not. That is something we will have to consider although I do believe he may need compression wrapping to try to help with edema control which could potentially be of benefit. 06/28/2019 on evaluation today patient appears to be doing slightly better measurement wise although this is not terribly smaller he least seems to be trending towards that direction. With that being said he still seems to have purulent drainage noted in the wound bed at this time. He has been on Levaquin followed by Cipro over the past month. Unfortunately he still seems to have some issues with active infection at this time. I did perform a culture last week in order to evaluate and see if indeed there was still anything going on. Subsequently the culture did come back [name], [name] ([id number]) showing Pseudomonas which is consistent with the drainage has been having which is blue-green in color. He also has had an odor that again was somewhat consistent with Pseudomonas as well. Long story short it appears that the culture showed an intermediate finding with regard to how well the Cipro will work for the Pseudomonas infection. Subsequently being that he does not seem to be clearing up and at best what we are doing is just keeping this at [place] I think he may need to see infectious disease to discuss IV antibiotic options. 07/05/2019 upon evaluation today  patient appears to be doing okay in regard to his leg ulcer. He has been tolerating the dressing changes at this point without complication. Fortunately there is no  signs of active infection at this time which is good news. No fevers, chills, nausea, vomiting, or diarrhea. With that being said he does have an appointment with infectious disease tomorrow and his primary care on Wednesday. Again the reason for the infectious disease referral was due to the fact that he did not seem to be fully resolving with the use of oral antibiotics and therefore we were thinking that IV antibiotic therapy may be necessary secondary to the fact that there was an intermediate finding for how effective the Cipro may be. Nonetheless again he has been having a lot of purulent and even green drainage. Fortunately right now that seems to have calmed down over the past week with the reinitiation of the oral antibiotic. Nonetheless we will see what Dr. Megan Salon has to say. 07/12/2019 upon evaluation today patient appears to be doing about the same at this point in regard to his left lower extremity ulcer. Fortunately there is no signs of active infection at this time which is good news I do believe the Levaquin has been beneficial I did review Dr. Hale Bogus note and to be honest I agree that the patient's leg does appear to be doing better currently. What we found in the past as he does not seem to really completely resolve he will stop the antibiotic and then subsequently things will revert back to having issues with blue-green drainage, increased pain, and overall worsening in general. Obviously that is the reason I sent him back to infectious disease. 07/19/2019 upon evaluation today patient appears to be doing roughly the same in size there is really no dramatic improvement. He has started back on the Levaquin at this point and though he seems to be doing okay he did still have a lot of blue/green drainage noted on  evaluation today unfortunately. I think that this is still indicative more likely of a Pseudomonas infection as previously noted and again he does see Dr. Megan Salon in just a couple of days. I do not know that were really able to effectively clear this with just oral antibiotics alone based on what I am seeing currently. Nonetheless we are still continue to try to manage as best we can with regard to the patient and his wound. I do think the wrap was helpful in decreasing the edema which is excellent news. No fevers, chills, nausea, vomiting, or diarrhea. 07/26/2019 upon evaluation today patient appears to be doing slightly better with regard to the overall appearance of the muscle there is no dark discoloration centrally. Fortunately there is no signs of active infection at this time. No fevers, chills, nausea, vomiting, or diarrhea. Patient's wound bed currently the patient did have an appointment with Dr. Megan Salon at infectious disease last week. With that being said Dr. Megan Salon the patient states was still somewhat hesitant about put him on any IV antibiotics he wanted [place] to repeat cultures today and then see where things go going forward. He does look like Dr. Megan Salon because of some improvement the patient did have with the Levaquin wanted [place] to see about repeating cultures. If it indeed grows the Pseudomonas again then he recommended a possibility of considering a PICC line placement and IV antibiotic therapy. He plans to see the patient back in 1 to 2 [name]. 08/02/2019 upon evaluation today patient appears to be doing poorly with regard to his left lower extremity. We did get the results of his culture back it shows that he is still showing evidence of Pseudomonas which  is consistent with the purulent/blue-green drainage that he has currently. Subsequently the culture also shows that he now is showing resistance to the oral fluoroquinolones which is unfortunate as that was really the only thing  to treat the infection prior. I do believe that he is looking like this is going require IV antibiotic therapy to get this under control. Fortunately there is no signs of systemic infection at this time which is good news. The patient does see Dr. Megan Salon tomorrow. 08/09/2019 upon evaluation today patient appears to be doing better with regard to his left lower extremity ulcer in regard to the overall appearance. He is currently on IV antibiotic therapy. As ordered by Dr. Megan Salon. Currently the patient is on ceftazidime which she is going to take for the next 2 [name] and then follow-up for 4 to 5-week appointment with Dr. Megan Salon. The patient started this this past Friday symptoms have not for a total of 3 days currently in full. 08/16/2019 upon evaluation today patient's wound actually does show muscle in the base of the wound but in general does appear to be much better as far as the overall evidence of infection is concerned. In fact I feel like this is for the most part cleared up he still on the IV antibiotics he has not completed the full course yet but I think he is doing much better which is excellent news. 08/23/2019 upon evaluation today patient appears to be doing about the same with regard to his wound at this point. He tells me that he still has pain unfortunately. Fortunately there is no evidence of systemic infection at this time which is great news. There is significant muscle protrusion. 09/13/19 upon evaluation today patient appears to be doing about the same in regard to his leg unfortunately. He still has a lot of drainage coming from the ulceration there is still muscle exposed. With that being said the patient's last wound culture still showed an intermediate finding with regard to the Pseudomonas he still having the bluish/green drainage as well. Overall I do not know that the wound has completely cleared of infection at this point. Fortunately there is no signs of active infection  systemically at this point which is good news. 09/20/2019 upon evaluation today patient's wound actually appears to be doing about the same based on what I am seeing currently. I do not see any signs of systemic infection he still does have evidence of some local infection and drainage. He did see Dr. Megan Salon last week and Dr. Megan Salon states that he probably does need a different IV antibiotic although he does not want to put him on this until the patient begins the Remicade infusion which is actually scheduled for about 10 days out from today on 13 May. Following that time Dr. Megan Salon is good to see him back and then will evaluate the feasibility of starting him on the IV antibiotic therapy once again at that point. I do not disagree with this plan I do believe as Dr. Megan Salon stated in his note that I reviewed today that the patient's issue is multifactorial with the pyoderma being 1 aspect of this that were hoping the Remicade will be helpful for her. In the meantime I think the gentamicin is, helping to keep things under decent okay control in regard to the ulcer. 09/27/2019 upon evaluation today patient appears to be doing about the same with regard to his wound still there is a lot of muscle exposure though he does have  some hyper granulation tissue noted around the edge and actually some granulation tissue starting to form over the muscle which is actually good news. Fortunately there is no evidence of active infection which is also good news. His pain is less at this point. 5/21; this is a patient I have not seen in a long time. 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 He has a large wound on the left lateral leg with protruding muscle. He comes in the clinic today showing the same area on his left medial ankle. He says there is been a spot there for some time although we have not previously defined this. Today he has a clearly defined area with slight  amount of skin breakdown surrounded by raised areas with a purplish hue in color. This is not painful he says it is irritated. 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 He still has muscle protruding from the lateral portion of his left leg but fortunately the new area that they were concerned about at his last visit does not appear to have opened at this point. He is currently on Remicade infusions and seems to be doing better in my opinion in fact the wound itself seems to be overall much better. The purplish discoloration that he did have seems to have resolved and I think that is a good sign that hopefully the Remicade is doing its job. He does Busbee, Rynell ([id number]) have some biofilm noted over the surface of the wound. 11/01/2019 on evaluation today patient's wound actually appears to be doing excellent at this time. Fortunately there is no evidence of active infection and overall I feel like he is making great progress. The Remicade seems to be due excellent job in my opinion. 11/08/19 evaluation today vision actually appears to be doing quite well with regard to his weight ulcer. He's been tolerating dressing changes without complication. Fortunately there is no evidence of infection. No fevers, chills, nausea, or vomiting noted at this time. Overall states that is having more itching than pain which is actually a good sign in my opinion. Objective Constitutional Well-nourished and well-hydrated in no acute distress. Vitals Time Taken: 8:06 AM, Height: 71 in, Weight: 338 lbs, BMI: 47.1, Temperature: 98.4 F, Pulse: 82 bpm, Respiratory Rate: 18 breaths/min, Blood Pressure: 141/59 mmHg. Respiratory normal breathing without difficulty. Psychiatric this patient is able to make decisions and demonstrates good insight into disease process. Alert and Oriented x 3. pleasant and  cooperative. General Notes: Patients were in bed actually showed signs of good healing at this point and I see no signs of infection which is also great news. There is no significant slot buildup noted on the wound bed. Integumentary (Hair, Skin) Wound #1 status is Open. Original cause of wound was Gradually Appeared. The wound is located on the Left,Lateral Lower Leg. The wound measures 6.6cm length x 6.4cm width x 0.6cm depth; 33.175cm^2 area and 19.905cm^3 volume. There is muscle and Fat Layer (Subcutaneous Tissue) Exposed exposed. There is no tunneling or undermining noted. There is a medium amount of purulent drainage noted. The wound margin is epibole. There is large (67-100%) pink granulation within the wound bed. There is a small (1-33%) amount of necrotic tissue within the wound bed including Adherent Slough. 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 Anesthetic (add to Medication List): Wound #1 Left,Lateral Lower Leg: Topical Lidocaine 4% cream applied to wound bed prior to debridement (In Clinic Only). Primary Wound Dressing: Gentamicin Sulfate Cream - mix 1:1 with triamcinolone to apply to the wound bed Secondary Dressing: Wound #1 Left,Lateral Lower Leg: Boardered Foam Dressing [place] ([id number]) Dressing Change Frequency: Wound #1 Left,Lateral Lower Leg: Change dressing every day. Follow-up Appointments: Wound #1 Left,Lateral Lower Leg: Return Appointment in 1 week. Edema Control: Wound #1 Left,Lateral Lower Leg: Patient to wear own compression stockings Elevate legs to the level of the heart and pump ankles as often as possible 1. At this point I  recommend we continue with the Current wound care measures mixing the gentamicin cream along with his steriod cream half and half. 2. I would also recommend you continue to cover this with a Boarder Foam Dressing. Please see above for specific wound care orders. We will see patient for re-evaluation in 2 week(s) here in the clinic. If anything worsens or changes patient will contact our office for additional recommendations. Electronic Signature(s) Signed: 11/08/2019 4:15:47 PM By: Worthy Keeler PA-C Entered By: Worthy [name] on 11/08/2019 16:15:46 Rickels, Ason ([id number]) -------------------------------------------------------------------------------- SuperBill Details Patient Name: [name] Date of Service: 11/08/2019 Medical Record Number: [id number] Patient Account Number: [id number] Date of Birth/Sex: 1980/06/18 (39 y.o. M) Treating RN: [name] Primary Care Provider: Alma [name] Other Clinician: Referring Provider: Alma [name] Treating Provider/Extender: [name], Trevone Prestwood [name] in Treatment: 152 Diagnosis Coding ICD-10 Codes Code Description [phone] Non-pressure chronic ulcer of left calf with fat layer exposed L88 Pyoderma gangrenosum L97.321 Non-pressure chronic ulcer of left ankle limited to breakdown of skin I87.2 Venous insufficiency (chronic) (peripheral) L03.116 Cellulitis of left lower limb Physician Procedures CPT4 Code: [id number] Description: 99213 - WC PHYS LEVEL 3 - EST PT Modifier: Quantity: 1 CPT4 Code: Description: ICD-10 Diagnosis Description L97.222 Non-pressure chronic ulcer of left calf with fat layer exposed L97.321 Non-pressure chronic ulcer of left ankle limited to breakdown of skin L88 Pyoderma gangrenosum I87.2 Venous insufficiency (chronic)  (peripheral) Modifier: Quantity: Electronic Signature(s) Signed: 11/08/2019 12:02:37 PM By: [name], [name], RN, [name], [name], [name] Signed: 11/08/2019 4:16:09 PM By: Worthy Keeler PA-C Entered By:  [name], [name], RN, [name], [name] on 11/08/2019 12:02:36

## 2019-11-09 NOTE — Progress Notes (Signed)
RONEY, YOUTZ ([id number]) Visit Report for 11/08/2019 Arrival Information Details Patient Name: [name], [name] Date of Service: 11/08/2019 8:15 AM Medical Record Number: [id number] Patient Account Number: [id number] Date of Birth/Sex: 1981/05/07 (39 y.o. M) Treating RN: [name] Primary Care Kambri Dismore: [name] Other Clinician: Referring Ilisha Blust: [name] Treating Raahim Shartzer/Extender: Melburn Hake, [name]: 80 Visit Information History Since Last Visit Added or deleted any medications: No Patient Arrived: Ambulatory Any new allergies or adverse reactions: No Arrival Time: 10:07 Had a fall or experienced change in No Accompanied By: self activities of daily living that may affect Transfer Assistance: None risk of falls: Patient Identification Verified: Yes Signs or symptoms of abuse/neglect since last visito No Secondary Verification Process Completed: Yes Hospitalized since last visit: No Patient Requires Transmission-Based No Implantable device outside of the clinic excluding No Precautions: cellular tissue based products placed in the center Patient Has Alerts: Yes since last visit: Patient Alerts: 08/09/19 Picc Line Has Dressing in Place as Prescribed: Yes Right Pain Present Now: No Electronic Signature(s) Signed: 11/08/2019 10:18:35 AM By: Sandre Kitty Entered By: Sandre Kitty on 11/08/2019 10:08:34 [name] ([id number]) -------------------------------------------------------------------------------- Clinic Level of Care Assessment Details Patient Name: [name] Date of Service: 11/08/2019 8:15 AM Medical Record Number: [id number] Patient Account Number: [id number] Date of Birth/Sex: 11/05/1980 (39 y.o. M) Treating RN: [name] Primary Care Jermarcus Mcfadyen: [name] Other Clinician: Referring Haifa Hatton: [name] Treating Press Casale/Extender: Melburn Hake, [name]: [facility] Level of Care Assessment  Items TOOL 4 Quantity Score []  - Use when only an EandM is performed on FOLLOW-UP visit 0 ASSESSMENTS - Nursing Assessment / Reassessment X - Reassessment of Co-morbidities (includes updates in patient status) 1 10 X- 1 5 Reassessment of Adherence to Treatment Plan ASSESSMENTS - Wound and Skin Assessment / Reassessment X - Simple Wound Assessment / Reassessment - one wound 1 5 []  - 0 Complex Wound Assessment / Reassessment - multiple wounds []  - 0 Dermatologic / Skin Assessment (not related to wound area) ASSESSMENTS - Focused Assessment []  - Circumferential Edema Measurements - multi extremities 0 []  - 0 Nutritional Assessment / Counseling / Intervention []  - 0 Lower Extremity Assessment (monofilament, tuning fork, pulses) []  - 0 Peripheral Arterial Disease Assessment (using hand held doppler) ASSESSMENTS - Ostomy and/or Continence Assessment and Care []  - Incontinence Assessment and Management 0 []  - 0 Ostomy Care Assessment and Management (repouching, etc.) PROCESS - Coordination of Care X - Simple Patient / Family Education for ongoing care 1 15 []  - 0 Complex (extensive) Patient / Family Education for ongoing care []  - 0 Staff obtains Programmer, systems, Records, Test Results / Process Orders []  - 0 Staff telephones HHA, Nursing Homes / Clarify orders / etc []  - 0 Routine Transfer to another Facility (non-emergent condition) []  - 0 Routine Hospital Admission (non-emergent condition) []  - 0 New Admissions / Biomedical engineer / Ordering NPWT, Apligraf, etc. []  - 0 Emergency Hospital Admission (emergent condition) X- 1 10 Simple Discharge Coordination []  - 0 Complex (extensive) Discharge Coordination PROCESS - Special Needs []  - Pediatric / Minor Patient Management 0 []  - 0 Isolation Patient Management []  - 0 Hearing / Language / Visual special needs []  - 0 Assessment of Community assistance (transportation, D/C planning, etc.) []  - 0 Additional assistance /  Altered mentation []  - 0 Support Surface(s) Assessment (bed, cushion, seat, etc.) INTERVENTIONS - Wound Cleansing / Measurement Kleinsasser, Etan ([id number]) X- 1 5 Simple Wound Cleansing - one wound []  - 0 Complex Wound  Cleansing - multiple wounds X- 1 5 Wound Imaging (photographs - any number of wounds) []  - 0 Wound Tracing (instead of photographs) X- 1 5 Simple Wound Measurement - one wound []  - 0 Complex Wound Measurement - multiple wounds INTERVENTIONS - Wound Dressings []  - Small Wound Dressing one or multiple wounds 0 X- 1 15 Medium Wound Dressing one or multiple wounds []  - 0 Large Wound Dressing one or multiple wounds X- 1 5 Application of Medications - topical []  - 0 Application of Medications - injection INTERVENTIONS - Miscellaneous []  - External ear exam 0 []  - 0 Specimen Collection (cultures, biopsies, blood, body fluids, etc.) []  - 0 Specimen(s) / Culture(s) sent or taken to Lab for analysis []  - 0 Patient Transfer (multiple staff / Civil Service fast streamer / Similar devices) []  - 0 Simple Staple / Suture removal (25 or less) []  - 0 Complex Staple / Suture removal (26 or more) []  - 0 Hypo / Hyperglycemic Management (close monitor of Blood Glucose) []  - 0 Ankle / Brachial Index (ABI) - do not check if billed separately X- 1 5 Vital Signs Has the patient been seen at the hospital within the last three years: Yes Total Score: 85 Level Of Care: New/Established - Level 3 Electronic Signature(s) Signed: 11/08/2019 4:54:26 PM By: [name], [name], RN, [name], [name], [name] Entered By: [name], [name], RN, [name], [name] on 11/08/2019 12:02:25 [name] ([id number]) -------------------------------------------------------------------------------- Encounter Discharge Information Details Patient Name: [name] Date of Service: 11/08/2019 8:15 AM Medical Record Number: [id number] Patient Account Number: [id number] Date of Birth/Sex: [birth year] (39 y.o. M) Treating RN: [name] Primary  Care Mckinze Poirier: [name] Other Clinician: Referring Jassmine Vandruff: [name] Treating Joan Avetisyan/Extender: Melburn Hake, [name]: 152 Encounter Discharge Information Items Discharge Condition: Stable Ambulatory Status: Ambulatory Discharge Destination: Home Transportation: Private Auto Accompanied By: self Schedule Follow-up Appointment: Yes Clinical Summary of Care: Electronic Signature(s) Signed: 11/08/2019 12:04:07 PM By: [name], [name], RN, [name], [name], [name] Entered By: [name], [name], RN, [name], [name] on 11/08/2019 12:04:06 [name] ([id number]) -------------------------------------------------------------------------------- Lower Extremity Assessment Details Patient Name: [name] Date of Service: 11/08/2019 8:15 AM Medical Record Number: [id number] Patient Account Number: [id number] Date of Birth/Sex: May 20, 1981 (39 y.o. M) Treating RN: Montey Hora Primary Care Sherrita Riederer: [name] Other Clinician: Referring Tashala Cumbo: [name] Treating Britanni Yarde/Extender: STONE III, [name]: 152 Edema Assessment Assessed: [Left: No] [Right: No] Edema: [Left: Ye] [Right: s] Vascular Assessment Pulses: Dorsalis Pedis Palpable: [Left:Yes] Electronic Signature(s) Signed: 11/08/2019 10:18:43 AM By: Montey Hora Entered By: Montey Hora on 11/08/2019 10:18:42 [name] ([id number]) -------------------------------------------------------------------------------- Multi Wound Chart Details Patient Name: [name] Date of Service: 11/08/2019 8:15 AM Medical Record Number: [id number] Patient Account Number: [id number] Date of Birth/Sex: Oct 07, 1980 (39 y.o. M) Treating RN: [name] Primary Care Colene Mines: [name] Other Clinician: Referring Tazia [name]: [name] Treating Jakeia Carreras/Extender: Melburn Hake, [name]: 152 Vital Signs Height(in): 71 Pulse(bpm): 82 Weight(lbs): 338 Blood Pressure(mmHg): 141/59 Body  Mass Index(BMI): 47 Temperature(F): 98.4 Respiratory Rate(breaths/min): 18 Photos: [N/A:N/A] Wound Location: Left, Lateral Lower Leg N/A N/A Wounding Event: Gradually Appeared N/A N/A Primary Etiology: Pyoderma N/A N/A Comorbid History: Sleep Apnea, Hypertension, Colitis N/A N/A Date Acquired: 11/18/2015 N/A N/A Weeks of Treatment: 152 N/A N/A Wound Status: Open N/A N/A Measurements L x W x D (cm) 6.6x6.4x0.6 N/A N/A Area (cm) : 33.175 N/A N/A Volume (cm) : 19.905 N/A N/A % Reduction in Area: -575.80% N/A N/A % Reduction in Volume: -406.90% N/A N/A Classification:  Full Thickness With Exposed N/A N/A Support Structures Exudate Amount: Medium N/A N/A Exudate Type: Purulent N/A N/A Exudate Color: yellow, brown, green N/A N/A Wound Margin: Epibole N/A N/A Granulation Amount: Large (67-100%) N/A N/A Granulation Quality: Pink N/A N/A Necrotic Amount: Small (1-33%) N/A N/A Exposed Structures: Fat Layer (Subcutaneous Tissue) N/A N/A Exposed: Yes Muscle: Yes Fascia: No Tendon: No Joint: No Bone: No Epithelialization: None N/A N/A Treatment Notes Electronic Signature(s) Signed: 11/08/2019 12:00:47 PM By: [name], [name], RN, [name], [name], [name] Entered By: [name], [name], RN, [name], [name] on 11/08/2019 12:00:46 [name] ([id number]) -------------------------------------------------------------------------------- Multi-Disciplinary Care Plan Details Patient Name: [name] Date of Service: 11/08/2019 8:15 AM Medical Record Number: [id number] Patient Account Number: [id number] Date of Birth/Sex: 1981-03-02 (39 y.o. M) Treating RN: [name] Primary Care Anarosa Kubisiak: [name] Other Clinician: Referring Afsa Meany: [name] Treating Latash Nouri/Extender: Melburn Hake, [name]: 152 Active Inactive Venous Leg Ulcer Nursing Diagnoses: Knowledge deficit related to disease process and management Potential for venous Insuffiency (use before diagnosis  confirmed) Goals: Non-invasive venous studies are completed as ordered Date Initiated: 12/11/2016 Target Resolution Date: 03/13/2017 Goal Status: Active Patient will maintain optimal edema control Date Initiated: 12/11/2016 Target Resolution Date: 03/13/2017 Goal Status: Active Patient/caregiver will verbalize understanding of disease process and disease management Date Initiated: 12/11/2016 Target Resolution Date: 03/13/2017 Goal Status: Active Verify adequate tissue perfusion prior to therapeutic compression application Date Initiated: 12/11/2016 Target Resolution Date: 03/13/2017 Goal Status: Active Interventions: Assess peripheral edema status every visit. Compression as ordered Provide education on venous insufficiency Treatment Activities: Therapeutic compression applied : 12/11/2016 Notes: Wound/Skin Impairment Nursing Diagnoses: Impaired tissue integrity Knowledge deficit related to ulceration/compromised skin integrity Goals: Patient/caregiver will verbalize understanding of skin care regimen Date Initiated: 12/11/2016 Target Resolution Date: 03/13/2017 Goal Status: Active Ulcer/skin breakdown will have a volume reduction of 30% by week 4 Date Initiated: 12/11/2016 Target Resolution Date: 03/13/2017 Goal Status: Active Ulcer/skin breakdown will have a volume reduction of 50% by week 8 Date Initiated: 12/11/2016 Target Resolution Date: 03/13/2017 Goal Status: Active Ulcer/skin breakdown will have a volume reduction of 80% by week 12 Date Initiated: 12/11/2016 Target Resolution Date: 03/13/2017 Goal Status: Active Ulcer/skin breakdown will heal within 14 weeks Date Initiated: 12/11/2016 Target Resolution Date: 03/13/2017 Goal Status: Active TORENCE, PALMERI ([id number]) Interventions: Assess patient/caregiver ability to obtain necessary supplies Assess patient/caregiver ability to perform ulcer/skin care regimen upon admission and as needed Assess ulceration(s) every  visit Provide education on ulcer and skin care Treatment Activities: Skin care regimen initiated : 12/11/2016 Topical wound management initiated : 12/11/2016 Notes: Electronic Signature(s) Signed: 11/08/2019 12:00:35 PM By: [name], [name], RN, [name], [name], [name] Entered By: [name], [name], RN, [name], [name] on 11/08/2019 12:00:34 [name] ([id number]) -------------------------------------------------------------------------------- Pain Assessment Details Patient Name: [name] Date of Service: 11/08/2019 8:15 AM Medical Record Number: [id number] Patient Account Number: [id number] Date of Birth/Sex: [birth year] (39 y.o. M) Treating RN: [name] Primary Care [place]ville Dani: [name] Other Clinician: Referring Staley Budzinski: [name] Treating Lamiya Naas/Extender: Melburn Hake, [name]: 152 Active Problems Location of Pain Severity and Description of Pain Patient Has Paino No Site Locations Pain Management and Medication Current Pain Management: Electronic Signature(s) Signed: 11/08/2019 10:18:35 AM By: Sandre Kitty Signed: 11/08/2019 4:54:26 PM By: [name], [name], RN, [name], [name], [name] Entered By: Sandre Kitty on 11/08/2019 10:09:27 [name] ([id number]) -------------------------------------------------------------------------------- Patient/Caregiver Education Details Patient Name: [name] Date of Service: 11/08/2019 8:15 AM Medical Record Number: [id number] Patient Account Number: [id number] Date of Birth/Gender:  [birth year] (39 y.o. M) Treating RN: [name] Primary Care Physician: [name] Other Clinician: Referring Physician: [name] Treating Physician/Extender: Sharalyn Ink in Treatment: 152 Education Assessment Education Provided To: Patient Education Topics Provided Wound/Skin Impairment: Handouts: Caring for Your Ulcer, Other: continue wound care as prescribed Methods: Demonstration Responses: State content  correctly Electronic Signature(s) Signed: 11/08/2019 4:54:26 PM By: [name], [name], RN, [name], [name], [name] Entered By: [name], [name], RN, [name], [name] on 11/08/2019 12:03:14 [name] ([id number]) -------------------------------------------------------------------------------- Wound Assessment Details Patient Name: [name] Date of Service: 11/08/2019 8:15 AM Medical Record Number: [id number] Patient Account Number: [id number] Date of Birth/Sex: 11/26/1980 (39 y.o. M) Treating RN: [name] Primary Care Modest Draeger: [name] Other Clinician: Referring [place] Baldwin: [name] Treating Cuca Benassi/Extender: Melburn Hake, [name]: 152 Wound Status Wound Number: 1 Primary Etiology: Pyoderma Wound Location: Left, Lateral Lower Leg Wound Status: Open Wounding Event: Gradually Appeared Comorbid History: Sleep Apnea, Hypertension, Colitis Date Acquired: 11/18/2015 Weeks Of Treatment: 152 Clustered Wound: No Photos Wound Measurements Length: (cm) 6.6 Width: (cm) 6.4 Depth: (cm) 0.6 Area: (cm) 33.175 Volume: (cm) 19.905 % Reduction in Area: -575.8% % Reduction in Volume: -406.9% Epithelialization: None Tunneling: No Undermining: No Wound Description Classification: Full Thickness With Exposed Support Structures Wound Margin: Epibole Exudate Amount: Medium Exudate Type: Purulent Exudate Color: yellow, brown, green Foul Odor After Cleansing: No Slough/Fibrino Yes Wound Bed Granulation Amount: Large (67-100%) Exposed Structure Granulation Quality: Pink Fascia Exposed: No Necrotic Amount: Small (1-33%) Fat Layer (Subcutaneous Tissue) Exposed: Yes Necrotic Quality: Adherent Slough Tendon Exposed: No Muscle Exposed: Yes Necrosis of Muscle: No Joint Exposed: No Bone Exposed: No Treatment Notes Wound #1 (Left, Lateral Lower Leg) Notes gent oinTment on lateral leg TCA on medial with BFD YANCEY, PEDLEY ([id number]) Electronic Signature(s) Signed: 11/08/2019 10:18:32  AM By: Montey Hora Signed: 11/08/2019 4:54:26 PM By: [name], [name], RN, [name], [name], [name] Previous Signature: 11/08/2019 10:18:35 AM Version By: Sandre Kitty Entered By: Montey Hora on 11/08/2019 10:18:31 [name] ([id number]) -------------------------------------------------------------------------------- Vitals Details Patient Name: [name] Date of Service: 11/08/2019 8:15 AM Medical Record Number: [id number] Patient Account Number: [id number] Date of Birth/Sex: 01/16/1981 (39 y.o. M) Treating RN: [name] Primary Care Quaniya Damas: [name] Other Clinician: Referring Kemal Amores: [name] Treating Danine Hor/Extender: Melburn Hake, [name]: 152 Vital Signs Time Taken: 08:06 Temperature (F): 98.4 Height (in): 71 Pulse (bpm): 82 Weight (lbs): 338 Respiratory Rate (breaths/min): 18 Body Mass Index (BMI): 47.1 Blood Pressure (mmHg): 141/59 Reference Range: 80 - 120 mg / dl Electronic Signature(s) Signed: 11/08/2019 10:18:35 AM By: Sandre Kitty Entered By: Sandre Kitty on 11/08/2019 10:09:20

## 2019-11-11 NOTE — Unmapped (Signed)
Infusion update 11/11/2019

## 2019-11-17 ENCOUNTER — Ambulatory Visit
Admit: 2019-11-17 | Discharge: 2019-11-18 | Payer: PRIVATE HEALTH INSURANCE | Attending: Dermatology | Primary: Dermatology

## 2019-11-17 DIAGNOSIS — L88 Pyoderma gangrenosum: Principal | ICD-10-CM

## 2019-11-17 NOTE — Unmapped (Signed)
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     Chronic illness with exacerbation, active but slowly improving    -patient amenable to plan to cont infliximab 10mg /kg week 0, 2, 6, then q 8 weeks. Discussed risks of tuberculosis reactivation, other uncommon infections, theoretical risk of malignancies, and other uncommon side effects.  - trental may be an option in the future but will await vascular consultation  - also discussed that medications like gabapentin may be an option to help with nerve pain. He will think about this and let us know.  -pt to continue to follow with his weekly local wound clinic  -start clobetasol to medial ankle  -cont gentamicin ointment to ulcer    High risk medication use: currently on Humira with switch to Remicade  - quant gold negative 08/17/19 - up to date  - reviewed CBC and bun/cr, ast/alt 08/17/19 - stable  - hepatitis panel (HBV cAb / sAb and HCV) negative 07/2019 -- HBV non-immune  Discussed risks of infection, potential risk of malignancy, injection site reactions. No hx of CHF, MS, lupus, malignancy, hepatitis, TB    Return in about 2 months (around 01/17/2020) for Recheck. or sooner as needed    CC:   Chief Complaint   Patient presents with   ??? Wound Check     the area on his lower left leg is doing well no new concerns         HPI:  39 y.o. year old male seen today for f/up of his PG. Notes a lot of improvement with the infliximab. Notes a new spot on the L ankle present for a few months, not worsening and stable over time.     Concerns include:    Ulcer on lateral aspect of L leg. Present for years. Has been on many treatments in the past, with predominance of prednisone (from which he has secondary effects including diabetes). Currently on humira, started in Dec. Tolerating well but not sure if helping. Seeing [facility] weekly. Applying triamcinolone to wound base daily. Wearing OTC compression stockings, not sure if there is a numbered compression a/w these.     He was recently seen by [facility] vascular and determined to not have disease amenable to intervention.      No other new or changing lesions or areas of concern.     Disease history:  -Starting about 2013, he started to develop itchy bumps that enlarge when he scratches and picks at them. This initially affected just his lower legs, but more recently prior to initial presentation to our clinic, involvement spread to his thighs, buttocks, arms, and forearms.  -Areas start as tiny bumps that he then scratches. 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   -In spring 2018, an ulcer started on the L lateral calf and gradually enlarged, but was slowly healing and shrinking by the time of presentation to our clinic. Denies any trauma at this site and notes that it started similar to other rashes on skin. Prior treatments include oral abx, including as recently as 12/2016 with courses of clinda and doxy, as well as topical steroids. He has  h/o ulcerative colitis, now in remission without meds since 2015. Denies h/o liver or kidney disease or DM. Used to live in [place] when this started. Works for [employer] and is on feet all day, endorses swelling of legs. Denies new meds prior to onset.  - At his initial visit 01/22/2017, clobetasol ointment and doxycycline 100mg  bid x 4 weeks were started. A biopsy of a keratotic pruritic plaque was obtained. 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 Tissue culture grew 4+ MSSA. 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   - w/up for underlying pruritus (given patient report and difficulty not picking at the involved areas) on 10/17 revealed unremarkable HIV, hepatitis panel, LDH, TSH, free T4 . Outside slides from previous bx were reviewed by Dr. Eyvonne Left and showed venous stasis changes as well as a neutrophilic infiltrate that could be consistent with PG. He was started on topical clobetasol BID. He was continued on PO doxycycline for additional 6 weeks. His left leg ulcer continued to grow and the site of his previous biopsy on the posterior right leg has also developed a large non healing ulcer.   -10/18: w/up for underlying etiology of PG negative on 10/18: negative spep/upep, cbc w/diff consistently normal, and reports a known hx of ulcerative colitis which is in remssion  - Repeat biopsy as below done on 03/26/17 primarily c/w PG vs infectious with one potential organism on Fite stain but unclear if related.   - Patient started on prednisone on 04/01/17 with almost resolution by 7/19.  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 Stopped doxepin and gabapentin given lack of improvement  -rx sent for humira  02/2019 given worsening. Process started for renflexis in event of failure  -Evaluation by [facility] vascular - not thought to be amenable to procedural intervention  -09/30/19 - started infliximab    Pertinent PMH: No history of skin cancer   Ulcerative colitis, managed by [facility]  Join pain managed by [facility]  HTN  DM   Denies h/o liver, kidney abnormalities   Patient Active Problem List   Diagnosis   ??? Depression   ??? Hyperlipidemia   ??? Lower extremity edema   ??? Seasonal allergic rhinitis   ??? Skin ulcer of left calf with fat layer exposed (CMS-HCC)   ??? Pyoderma gangrenosum   ??? History of ulcerative colitis   ??? High risk medication use   ??? Ulcerative colitis (CMS-HCC)   ??? Type 2 diabetes mellitus (CMS-HCC)   ??? Venous stasis   ??? Normocytic anemia       FH: No family history of melanoma or chronic skin disease     SH: Lives in [facility] [place] [address]    ROS: Baseline state of health. 10 point ROS conducted and negative except as noted in HPI.    PE:  General: Well-developed, well-nourished, in no acute distress  Neuro: Alert and oriented, interacts appropriately  Mouth: not examined as patient wearing a mask  Ext: patient deferred examination  Skin: Per patient request, focused examination with inspection and palpation of the right upper extremity, left upper extremity, right lower extremity, left lower extremity, was performed and notable for the following. All other  areas examined were normal or had no significant findings.  - ulceration of lateral left lower extremity, to muscle. Minimal exudate.   - scaling and erythema with orange-brown discoloration on both distal LE in the setting of LE edema  - firm pink plaque with cribiform scarring the L medial ankle

## 2019-11-23 ENCOUNTER — Ambulatory Visit: Payer: 59 | Admitting: Physician Assistant

## 2019-11-25 ENCOUNTER — Ambulatory Visit: Payer: 59 | Admitting: Internal Medicine

## 2019-12-13 ENCOUNTER — Other Ambulatory Visit: Payer: Self-pay

## 2019-12-13 ENCOUNTER — Encounter: Payer: 59 | Attending: Physician Assistant | Admitting: Physician Assistant

## 2019-12-13 DIAGNOSIS — I10 Essential (primary) hypertension: Secondary | ICD-10-CM | POA: Insufficient documentation

## 2019-12-13 DIAGNOSIS — L03116 Cellulitis of left lower limb: Secondary | ICD-10-CM | POA: Insufficient documentation

## 2019-12-13 DIAGNOSIS — G4730 Sleep apnea, unspecified: Secondary | ICD-10-CM | POA: Insufficient documentation

## 2019-12-13 DIAGNOSIS — L97222 Non-pressure chronic ulcer of left calf with fat layer exposed: Secondary | ICD-10-CM | POA: Diagnosis not present

## 2019-12-13 DIAGNOSIS — I872 Venous insufficiency (chronic) (peripheral): Secondary | ICD-10-CM | POA: Insufficient documentation

## 2019-12-13 DIAGNOSIS — L97321 Non-pressure chronic ulcer of left ankle limited to breakdown of skin: Secondary | ICD-10-CM | POA: Diagnosis present

## 2019-12-13 NOTE — Progress Notes (Addendum)
JSHAUN, ABERNATHY ([id number]) Visit Report for 12/13/2019 Chief Complaint Document Details Patient Name: [name] Date of Service: 12/13/2019 9:00 AM Medical Record Number: [id number] Patient Account Number: [id number] Date of Birth/Sex: [birth year] (39 y.o. M) Treating RN: [name] Primary Care Provider: [name] Other Clinician: Referring Provider: [name] Treating Provider/Extender: Melburn Hake, Malie Kashani Weeks in Treatment: 157 Information Obtained from: Patient Chief Complaint He is here in follow up evaluation for LLE pyoderma ulcer Electronic Signature(s) Signed: 12/13/2019 9:36:29 AM By: Worthy Keeler PA-C Entered By: Worthy Keeler on 12/13/2019 09:36:28 ROMOLO, SIELING ([id number]) -------------------------------------------------------------------------------- HPI Details Patient Name: [name] Date of Service: 12/13/2019 9:00 AM Medical Record Number: [id number] Patient Account Number: [id number] Date of Birth/Sex: August 22, 1980 (39 y.o. M) Treating RN: [name] Primary Care Provider: [name] Other Clinician: Referring Provider: [name] Treating Provider/Extender: Melburn Hake, Sunita Demond Weeks in Treatment: 157 History of Present Illness HPI Description: 12/04/16; 39 year old man who comes into the clinic today for review of a wound on the posterior left calf. He tells me that is been there for about a year. He is not a diabetic he does smoke half a pack per day. He was seen in the ER on 11/20/16 felt to have cellulitis around the wound and was given clindamycin. An x-ray did not show osteomyelitis. The patient initially tells me that he has a milk allergy that sets off a pruritic itching rash on his lower legs which she scratches incessantly and he thinks that's what may have set up the wound. He has been using various topical antibiotics and ointments without any effect. He works in a [employer] and is on his feet all day. He does not have a  prior history of wounds however he does have the rash on both lower legs the right arm and the ventral aspect of his left arm. These are excoriations and clearly have had scratching however there are of macular looking areas on both legs including a substantial larger area on the right leg. This does not have an underlying open area. There is no blistering. The patient tells me that 2 years ago in [place] in response to the rash on his legs he saw a dermatologist who told him he had a condition which may be pyoderma gangrenosum although I may be putting words into his mouth. He seemed to recognize this. 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 He is not in any treatment for this. He's had no recent travel 12/11/16; the patient arrives today with his wound and roughly the same condition we've been using silver alginate this is a deep punched out wound with some surrounding erythema but no tenderness. Biopsy I did did not show confirmed pyoderma gangrenosum suggested nonspecific inflammation and vasculitis but does not provide an actual description of what was seen by the pathologist. I'm really not able to understand this We have also received information from the patient's dermatologist in [place] notes from April 2016. This was a doctor Agarwal-antal. 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 He was prescribed topical steroid high potency under occlusion which helped but at this point the patient did not have a deep punched out wound. 12/18/16; the patient's wound is larger in terms of surface area however this surface looks better and there is less depth. The surrounding erythema also is better. The patient states that the wrap we put on came off 2 days ago when he has been using his  compression stockings. He we are in the process of getting a dermatology consult. 12/26/16 on evaluation today patient's left lower extremity wound shows evidence of  infection with surrounding erythema noted. He has been tolerating the dressing changes but states that he has noted more discomfort. There is a larger area of erythema surrounding the wound. No fevers, chills, nausea, or vomiting noted at this time. With that being said the wound still does have slough covering the surface. He is not allergic to any medication that he is aware of at this point. In regard to his right lower extremity he had several regions that are erythematous and pruritic he wonders if there's anything we can do to help that. 01/02/17 I reviewed patient's wound culture which was obtained his visit last week. He was placed on doxycycline at that point. Unfortunately that does not appear to be an antibiotic that would likely help with the situation however the pseudomonas noted on culture is sensitive to Cipro. Also unfortunately patient's wound seems to have a large compared to last week's evaluation. Not severely so but there are definitely increased measurements in general. He is continuing to have discomfort as well he writes this to be a seven out of 10. In fact he would prefer me not to perform any debridement today due to the fact that he is having discomfort and considering he has an active infection on the little reluctant to do so anyway. No fevers, chills, nausea, or vomiting noted at this time. 01/08/17; patient seems dermatology on September 5. I suspect dermatology will want the slides from the biopsy I did sent to their pathologist. I'm not sure if there is a way we can expedite that. In any case the culture I did before I left on vacation 3 weeks ago showed Pseudomonas he was given 10 days of Cipro and per her description of her intake nurses is actually somewhat better this week although the wound is quite a bit bigger than I remember the last time I saw this. He still has 3 more days of Cipro 01/21/17; dermatology appointment tomorrow. He has completed the ciprofloxacin  for Pseudomonas. Surface of the wound looks better however he is had some deterioration in the lesions on his right leg. Meantime the left lateral leg wound we will continue with sample 01/29/17; patient had his dermatology appointment but I can't yet see that note. He is completed his antibiotics. The wound is more superficial but considerably larger in circumferential area than when he came in. This is in his left lateral calf. He also has swollen erythematous areas with superficial wounds on the right leg and small papular areas on both arms. There apparently areas in her his upper thighs and buttocks I did not look at those. Dermatology biopsied the right leg. Hopefully will have their input next week. 02/05/17; patient went back to see his dermatologist who told him that he had a "scratching problem" as well as staph. He is now on a 30 day course of doxycycline and I believe she gave him triamcinolone cream to the right leg areas to help with the itching [not exactly sure but probably triamcinolone]. She apparently looked at the left lateral leg wound although this was not rebiopsied and I think felt to be ultimately part of the same pathogenesis. He is using sample border foam and changing nevus himself. He now has a new open area on the right posterior leg which was his biopsy site I don't have any of the  dermatology notes 02/12/17; we put the patient in compression last week with SANTYL to the wound on the left leg and the biopsy. Edema is much better and the depth of the wound is now at level of skin. Area is still the same oBiopsy site on the right lateral leg we've also been using santyl with a border foam dressing and he is changing this himself. 02/19/17; Using silver alginate started last week to both the substantial left leg wound and the biopsy site on the right wound. He is tolerating compression well. Has a an appointment with his primary M.D. tomorrow wondering about diuretics although  I'm wondering if the edema problem is actually lymphedema 02/26/17; the patient has been to see his primary doctor Dr. Jerrel Ivory at [place] our primary care. She started him on Lasix 20 mg and this seems to have helped with the edema. However we are not making substantial change with the left lateral calf wound and inflammation. The biopsy site on the right leg also looks stable but not really all that different. 03/12/17; the patient has been to see vein and vascular Dr. Lucky Cowboy. He has had venous reflux studies I have not reviewed these. I did get a call from his dermatology office. They felt that he might have pathergy based on their biopsy on his right leg which led them to look at the slides of JAMARQUIS, CRULL ([id number]) the biopsy I did on the left leg and they wonder whether this represents pyoderma gangrenosum which was the original supposition in a man with ulcerative colitis albeit inactive for many years. They therefore recommended clobetasol and tetracycline i.e. 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 03/26/17; apparently the patient just had reflux studies not an appointment with Dr. dew. She arrives in clinic today having applied clobetasol for 2-3 weeks. He notes over the last 2-3 days excessive drainage having to change the dressing 3-4 times a day and also expanding erythema. 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 Lillia Carmel at [facility] dermatology locally who kindly saw him at my request. 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 My concern was the degree of erythema medially extending from his left leg wound which was either inflammation from pyoderma  or cellulitis. I put him on Augmentin however culture of the wound showed Pseudomonas which is quinolone sensitive. I really don't believe he has cellulitis however in view of everything I will continue and give him a course of Cipro. He is also on doxycycline as an immune modulator for the pyoderma. In addition to his original wound on the left lateral leg with surrounding erythema he has a wound on the right posterior calf which was an original biopsy site done by dermatology. 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 Saw Dr. Lillia Carmel yesterday. He has been using topical antibiotics to both wound areas his original wound on the left and the biopsies/pathergy area on the right. There is definitely some improvement in the inflammation around the wound on the right although the patient states he has increasing sensitivity of the wounds. He is on prednisone 60 and doxycycline 1 as prescribed by Dr. Lillia Carmel. He is covering the topical antibiotic  with gauze and putting this in his own compression stocks and changing this daily. He states that Dr. Lottie Rater did a culture of the left leg wound yesterday 05/07/17; pyoderma gangrenosum. The patient saw Dr. Lillia Carmel yesterday and has a follow-up with her in one month. He is still using topical antibiotics to both wounds although he can't recall exactly what type. He is still on prednisone 60 mg. Dr. Lillia Carmel stated that the doxycycline could stop if we were in agreement. 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 The right medial leg was induced by biopsy/pathergy. The area on the right is essentially healed. 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 The right medial leg has closed and remains closed. He  is still on prednisone 60. oHe tells me he missed his last dermatology appointment with Dr. Lillia Carmel but will make another appointment. He reports that her blood sugar at a recent screen in [place] was high 200's. He was 180 today. 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 I think he is going to require still much longer prednisone perhaps another 3 months before attempting to taper. In the meantime his wound is a lot better. Smaller. He is cleaning this off daily and applying topical antibiotics. When he was last in the clinic I thought about changing to [facility] and actually put in a couple of calls to dermatology although probably not during their business hours. 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 He continues on oral prednisone. He has been using triple antibiotic ointment. There is surface debris and we will transition to [facility] and have him return in 2 weeks. He has lost 30 pounds since his last appointment with lifestyle modification. He may benefit from topical steroid cream for treatment this can be considered at a later date. 08/22/17 on evaluation today patient appears to actually be doing rather well in regard to his left lateral lower extremity ulcer. He has actually been managed by Dr. Dellia Nims most recently. Patient is currently on oral steroids at this time. This seems to have been of benefit for him. Nonetheless his last visit was actually with Leah on 08/06/17. Currently he is not utilizing any topical steroid creams although this could be of benefit as well. No fevers, chills, nausea, or vomiting noted at this time. 09/05/17 on evaluation today patient appears to be doing better in regard to his left lateral lower extremity ulcer. He has been tolerating the dressing changes without complication. He is using Santyl with good effect. Overall I'm  very pleased with how things are standing at this point. Patient likewise is happy that this is doing better. 09/19/17 on evaluation today patient actually appears to be doing rather well in regard to his left lateral lower extremity ulcer. 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 He's not having any significant pain. 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 He has been tolerating the Santyl without complication and in general I feel like he's making good progress. 10/17/17 on evaluation  today patient appears to be doing very well in regard to his left lateral lower surety ulcer. He has been tolerating the dressing changes without complication. There does not appear to be any evidence of infection he's alternating the Santyl and the triple antibiotic ointment every other day this seems to be doing well for him. 11/03/17 on evaluation today patient appears to be doing very well in regard to his left lateral lower extremity ulcer. He is been tolerating the dressing changes without complication which is good news. Fortunately there does not appear to be any evidence of infection which is also great news. Overall is doing excellent they are starting to taper down on the prednisone is down to 40 mg at this point it also started topical clobetasol for him. 11/17/17 on evaluation today patient appears to be doing well in regard to his left lateral lower surety ulcer. He's been tolerating the dressing changes without complication. He does note that he is having no pain, no excessive drainage or discharge, and overall he feels like things are going about how he would expect and hope they would. Overall he seems to have no evidence of infection at this time in my opinion which is good news. 12/04/17-He is seen in follow-up  evaluation for right lateral lower extremity ulcer. He has been applying topical steroid cream. Today's measurement show slight increase in size. Over the next 2 weeks we will transition to every other day Santyl and steroid cream. He has been encouraged to monitor for changes and notify clinic with any concerns 12/15/17 on evaluation today patient's left lateral motion the ulcer and fortunately is doing worse again at this point. This just since last week to this week has close to doubled in size according to the patient. I did not seeing last week's I do not have a visual to compare this to in our system was also down so we do not have all the charts and at this point. Nonetheless it does have me somewhat concerned in regard to the fact that again he was worried enough about it he has contact the dermatology that placed them back on the full strength, 50 mg a day of the prednisone that he was taken previous. He continues to alternate using clobetasol along with Santyl at this point. He is obviously somewhat frustrated. 12/22/17 on evaluation today patient appears to be doing a little worse compared to last evaluation. Unfortunately the wound is a little deeper and slightly larger than the last week's evaluation. With that being said he has made some progress in regard to the irritation surrounding at this time unfortunately despite that progress that's been made he still has a significant issue going on here. 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 BRNADON, EOFF ([id number]) For that reason I discussed with him today the possibility of perform a wound culture to ensure there's nothing overtly infected. 01/06/18 on evaluation today patient's wound is larger and deeper than previously evaluated. With that being said it did appear that his wound was  infected after my last evaluation with him. Subsequently I did end up prescribing a prescription for Bactrim DS which she has been taking and having no complication with. Fortunately there does not appear to be any evidence of infection at this point in time as far as anything spreading, no want to touch, and overall I  feel like things are showing signs of improvement. 01/13/18 on evaluation today patient appears to be even a little larger and deeper than last time. There still muscle exposed in the base of the wound. Nonetheless he does appear to be less erythematous I do believe inflammation is calming down also believe the infection looks like it's probably resolved at this time based on what I'm seeing. No fevers, chills, nausea, or vomiting noted at this time. 01/30/18 on evaluation today patient actually appears to visually look better for the most part. Unfortunately those visually this looks better he does seem to potentially have what may be an abscess in the muscle that has been noted in the central portion of the wound. This is the first time that I have noted what appears to be fluctuance in the central portion of the muscle. With that being said I'm somewhat more concerned about the fact that this might indicate an abscess formation at this location. I do believe that an ultrasound would be appropriate. This is likely something we need to try to do as soon as possible. He has been switch to mupirocin ointment and he is no longer using the steroid ointment as prescribed by dermatology he sees them again next week he's been decreased from 60 to 40 mg of prednisone. 03/09/18 on evaluation today patient actually appears to be doing a little better compared to last time I saw him. There's not as much erythema surrounding the wound itself. He I did review his most recent infectious disease note which was dated 02/24/18. He saw Dr. Michel Bickers in [place]. 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 Patient is now off of antibiotics and they are continually observing this. There seems to be no change in the past two weeks in my pinion based on what the patient says and what I see today compared to what Dr. Megan Salon likely saw two weeks ago. No fevers, chills, nausea, or vomiting noted at this time. 03/23/18 on evaluation today patient's wound actually appears to be showing signs of improvement which is good news. He is currently still on the Dapsone. He is also working on tapering the prednisone to get off of this and Dr. Lottie Rater is working with him in this regard. Nonetheless overall I feel like the wound is doing well it does appear based on the infectious disease note that I reviewed from Dr. 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 I did also review the results of his ultrasound of left lower extremity which revealed there was a dentist tissue in the base of the wound without an abscess noted. 04/06/18 on evaluation today the patient's left lateral lower extremity ulcer actually appears to be doing fairly well which is excellent news. There does not appear to be any evidence of infection at this time which is also great news. Overall he still does have a significantly large ulceration although little by little he seems to be making progress. He is down to 10 mg a day of the prednisone. 04/20/18 on evaluation today patient actually appears to be doing excellent at this time in regard to his left lower extremity ulcer. He's making signs of good progress unfortunately this is taking much longer than we would really like to see but nonetheless  he is making progress. Fortunately there does not appear to be any evidence of infection at this time. No fevers, chills, nausea, or vomiting noted at  this time. The patient has not been using the Santyl due to the cost he hadn't got in this field yet. He's mainly been using the antibiotic ointment topically. Subsequently he also tells me that he really has not been scrubbing in the shower I think this would be helpful again as I told him it doesn't have to be anything too aggressive to even make it believe just enough to keep it free of some of the loose slough and biofilm on the wound surface. 05/11/18 on evaluation today patient's wound appears to be making slow but sure progress in regard to the left lateral lower extremity ulcer. He is been tolerating the dressing changes without complication. Fortunately there does not appear to be any evidence of infection at this time. He is still just using triple antibiotic ointment along with clobetasol occasionally over the area. He never got the Santyl and really does not seem to intend to in my pinion. 06/01/18 on evaluation today patient appears to be doing a little better in regard to his left lateral lower extremity ulcer. He states that overall he does not feel like he is doing as well with the Dapsone as he did with the prednisone. Nonetheless he sees his dermatologist later today and is gonna talk to them about the possibility of going back on the prednisone. Overall again I believe that the wound would be better if you would utilize Santyl but he really does not seem to be interested in going back to the [place] at this point. He has been using triple antibiotic ointment. 06/15/18 on evaluation today patient's wound actually appears to be doing about the same at this point. Fortunately there is no signs of infection at this time. He has made slight improvements although he continues to not really want to clean the wound bed at this point. He states that he just doesn't mess with it he doesn't want to cause any problems with everything else he has going on. He has been on medication, antibiotics  as prescribed by his dermatologist, for a staff infection of his lower extremities which is really drying out now and looking much better he tells me. Fortunately there is no sign of overall infection. 06/29/18 on evaluation today patient appears to be doing well in regard to his left lateral lower surety ulcer all things considering. Fortunately his staff infection seems to be greatly improved compared to previous. He has no signs of infection and this is drying up quite nicely. He is still the doxycycline for this is no longer on cental, Dapsone, or any of the other medications. His dermatologist has recommended possibility of an infusion but right now he does not want to proceed with that. 07/13/18 on evaluation today patient appears to be doing about the same in regard to his left lateral lower surety ulcer. Fortunately there's no signs of infection at this time which is great news. Unfortunately he still builds up a significant amount of Slough/biofilm of the surface of the wound he still is not really cleaning this as he should be appropriately. Again I'm able to easily with saline and gauze remove the majority of this on the surface which if you would do this at home would likely be a dramatic improvement for him as far as getting the area to improve. Nonetheless overall I still feel  like he is making progress is just very slow. I think Santyl will be of benefit for him as well. Still he has not gotten this as of this point. 07/27/18 on evaluation today patient actually appears to be doing little worse in regards of the erythema around the periwound region of the wound he also tells me that he's been having more drainage currently compared to what he was experiencing last time I saw him. He states not quite as bad as what he had because this was infected previously but nonetheless is still appears to be doing poorly. Fortunately there is no evidence of systemic infection at this point. The patient  tells me that he is not going to be able to afford the Santyl. He is still waiting to hear about the infusion therapy with his dermatologist. Apparently she wants an updated colonoscopy first. 08/10/18 on evaluation today patient appears to be doing better in regard to his left lateral lower extremity ulcer. Fortunately he is showing signs of improvement in this regard he's actually been approved for Remicade infusion's as well although this has not been scheduled as of yet. Fortunately there's no signs of active infection at this time in regard to the wound although he is having some issues with infection of the right lower extremity is been seen as dermatologist for this. Fortunately they are definitely still working with him trying to keep things under control. ANGIE, HOGG ([id number]) 09/07/18 on evaluation today patient is actually doing rather well in regard to his left lateral lower extremity ulcer. He notes these actually having some hair grow back on his extremity which is something he has not seen in years. He also tells me that the pain is really not giving them any trouble at this time which is also good news overall she is very pleased with the progress he's using a combination of the mupirocin along with the probate is all mixed. 09/21/18 on evaluation today patient actually appears to be doing fairly well all things considered in regard to his looks from the ulcer. He's been tolerating the dressing changes without complication. Fortunately there's no signs of active infection at this time which is good news he is still on all antibiotics or prevention of the staff infection. He has been on prednisone for time although he states it is gonna contact his dermatologist and see if she put them on a short course due to some irritation that he has going on currently. Fortunately there's no evidence of any overall worsening this is going very slow I think cental would be something that would be  helpful for him although he states that $50 for tube is quite expensive. He therefore is not willing to get that at this point. 10/06/18 on evaluation today patient actually appears to be doing decently well in regard to his left lateral leg ulcer. He's been tolerating the dressing changes without complication. Fortunately there's no signs of active infection at this time. Overall I'm actually rather pleased with the progress he's making although it's slow he doesn't show any signs of infection and he does seem to be making some improvement. I do believe that he may need a switch up and dressings to try to help this to heal more appropriately and quickly. 10/19/18 on evaluation today patient actually appears to be doing better in regard to his left lateral lower extremity ulcer. This is shown signs of having much less Slough buildup at this point due to the fact he has  been using the Santyl. Obviously this is very good news. The overall size of the wound is not dramatically smaller but again the appearance is. 11/02/18 on evaluation today patient actually appears to be doing quite well in regard to his lower [place] ulcer. A lot of the skin around the ulcer is actually somewhat irritating at this point this seems to be more due to the dressing causing irritation from the adhesive that anything else. Fortunately there is no signs of active infection at this time. 11/24/18 on evaluation today patient appears to be doing a little worse in regard to his overall appearance of his lower extremity ulcer. There's more erythema and warmth around the wound unfortunately. He is currently on doxycycline which he has been on for some time. With that being said I'm not sure that seems to be helping with what appears to possibly be an acute cellulitis with regard to his left lower extremity ulcer. No fevers, chills, nausea, or vomiting noted at this time. 12/08/18 on evaluation today patient's wounds actually appears to  be doing significantly better compared to his last evaluation. He has been using Santyl along with alternating tripling about appointment as well as the steroid cream seems to be doing quite well and the wound is showing signs of improvement which is excellent news. Fortunately there's no evidence of infection and in fact his culture came back negative with only normal skin flora noted. 12/21/2018 upon evaluation today patient actually appears to be doing excellent with regard to his ulcer. This is actually the best that I have seen it since have been helping to take care of him. It is both smaller as well as less slough noted on the surface of the wound and seems to be showing signs of good improvement with new skin growing from the edges. He has been using just the triamcinolone he does wonder if he can get a refill of that ointment today. 01/04/2019 upon evaluation today patient actually appears to be doing well with regard to his left lateral lower extremity ulcer. With that being said it does not appear to be that he is doing quite as well as last time as far as progression is concerned. There does not appear to be any signs of infection or significant irritation which is good news. With that being said I do believe that he may benefit from switching to a collagen based dressing based on how clean The wound appears. 01/18/2019 on evaluation today patient actually appears to be doing well with regard to his wound on the left lower extremity. He is not made a lot of progress compared to where we were previous but nonetheless does seem to be doing okay at this time which is good news. There is no signs of active infection which is also good news. My only concern currently is I do wish we can get him into utilizing the collagen dressing his insurance would not pay for the supplies that we ordered although it appears that he may be able to order this through his supply company that he typically utilizes.  This is [employer]. Nonetheless he did try to order it during the office visit today and it appears this did go through. We will see if he can get that it is a different brand but nonetheless he has collagen and I do think will be beneficial. 02/01/2019 on evaluation today patient actually appears to be doing a little worse today in regard to the overall size of his wounds.  Fortunately there is no signs of active infection at this time. That is visually. Nonetheless when this is happened before it was due to infection. For that reason were somewhat concerned about that this time as well. 02/08/2019 on evaluation today patient unfortunately appears to be doing slightly worse with regard to his wound upon evaluation today. Is measuring a little deeper and a little larger unfortunately. I am not really sure exactly what is causing this to enlarge he actually did see his dermatologist she is going to see about initiating Humira for him. Subsequently she also did do steroid injections into the wound itself in the periphery. Nonetheless still nonetheless he seems to be getting a little bit larger he is gone back to just using the steroid cream topically which I think is appropriate. I would say hold off on the collagen for the time being is definitely a good thing to do. Based on the culture results which we finally did get the final result back regarding it shows staph as the bacteria noted again that can be a normal skin bacteria based on the fact however he is having increased drainage and worsening of the wound measurement wise I would go ahead and place him on an antibiotic today I do believe for this. 02/15/2019 on evaluation today patient actually appears to be doing somewhat better in regard to his ulcer. There is no signs of worsening at this time I did review his culture results which showed evidence of Staphylococcus aureus but not MRSA. Again this could just be more related to the normal skin  bacteria although he states the drainage has slowed down quite a bit he may have had a mild infection not just colonization. And was much smaller and then since around10/04/2019 on evaluation today patient appears to be doing unfortunately worse as far as the size of the wound. I really feel like that this is steadily getting larger again it had been doing excellent right at the beginning of September we have seen a steady increase in the area of the wound it is almost 2-1/2 times the size it was on September 1. Obviously this is a bad trend this is not wanting to see. For that reason we went back to using just the topical triamcinolone cream which does seem to help with inflammation. I checked him for bacteria by way of culture and nothing showed positive there. I am considering giving him a short course of a tapering steroid Dosepak today to see if that is can be beneficial for him. The patient is in agreement with giving that a try. 03/08/2019 on evaluation today patient appears to be doing very well in comparison to last evaluation with regard to his lower extremity ulcer. This is showing signs of less inflammation and actually measuring slightly smaller compared to last time every other week over the past month and a half he has been measuring larger larger larger. Nonetheless I do believe that the issue has been inflammation the prednisone does seem to [facility], [place] ([id number]) have been beneficial for him which is good news. No fevers, chills, nausea, vomiting, or diarrhea. 03/22/2019 on evaluation today patient appears to be doing about the same with regard to his leg ulcer. He has been tolerating the dressing changes without complication. With that being said the wound seems to be mostly arrested at its current size but really is not making any progress except for when we prescribed the prednisone. He did show some signs of dropping as  far as the overall size of the wound during that interval  week. Nonetheless this is something he is not on long-term at this point and unfortunately I think he is getting need either this or else the Humira which his dermatologist has discussed try to get approval for. With that being said he will be seeing his dermatologist on the 11th of this month that is November. 04/19/2019 on evaluation today patient appears to be doing really about the same the wound is measuring slightly larger compared to last time I saw him. He has not been into the office since November 2 due to the fact that he unfortunately had Covid as that his entire family. He tells me that it was rough but they did pull-through and he seems to be doing much better. Fortunately there is no signs of active infection at this time. No fevers, chills, nausea, vomiting, or diarrhea. 05/10/2019 on evaluation today patient unfortunately appears to be doing significantly worse as compared to last time I saw him. He does tell me that he has had his first dose of Humira and actually is scheduled to get the next one in the upcoming week. With that being said he tells me also that in the past several days he has been having a lot of issues with green drainage she showed me a picture this is more blue-green in color. He is also been having issues with increased sloughy buildup and the wound does appear to be larger today. Obviously this is not the direction that we want everything to take based on the starting of his Humira. Nonetheless I think this is definitely a result of likely infection and to be honest I think this is probably Pseudomonas causing the infection based on what I am seeing. 05/24/2019 on evaluation today patient unfortunately appears to be doing significantly worse compared to his prior evaluation with me 2 weeks ago. I did review his culture results which showed that he does have Staph aureus as well as Pseudomonas noted on the culture. Nonetheless the Levaquin that I prescribed for him  does not appear to have been appropriate and in fact he tells me he is no longer experiencing the green drainage and discharge that he had at the last visit. Fortunately there is no signs of active infection at this time which is good news although the wound has significantly worsened it in fact is much deeper than it was previous. We have been utilizing up to this point triamcinolone ointment as the prescription topical of choice but at this time I really feel like that the wound is getting need to be packed in order to appropriately manage this due to the deeper nature of the wound. Therefore something along the lines of an alginate dressing may be more appropriate. 05/31/2019 upon inspection today patient's wound actually showed signs of doing poorly at this point. Unfortunately he just does not seem to be making any good progress despite what we have tried. He actually did go ahead and pick up the Cipro and start taking that as he was noticing more green drainage he had previously completed the Levaquin that I prescribed for him as well. Nonetheless he missed his appointment for the seventh last week on Wednesday with the wound care center and [facility] where his dermatologist referred him. Obviously I do think a second opinion would be helpful at this point especially in light of the fact that the patient seems to be doing so poorly despite  the fact that we have tried everything that I really know how at this point. The only thing that ever seems to have helped him in the past is when he was on high doses of continual steroids that did seem to make a difference for him. 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 06/07/2019 upon evaluation today patient unfortunately appears to be doing worse yet again with regard to his wound. In fact I  am starting to question whether or not he may have a fluid pocket in the muscle at this point based on the bulging and the soft appearance to the central portion of the muscle area. There is not anything draining from the muscle itself at this time which is good news but nonetheless the wound is expanding. I am not really seeing any results of the Humira as far as overall wound progression based on what I am seeing at this point. The patient has been referred for second opinion with regard to his wound to the [facility] wound care center by his dermatologist which I definitely am not in opposition to. Unfortunately we tried multiple dressings in the past including collagen, alginate, and at one point even Hydrofera Blue. With that being said he is never really used it for any significant amount of time due to the fact that he often complains of pain associated with these dressings and then will go back to either using the Santyl which she has done intermittently or more frequently the triamcinolone. He is also using his own compression stockings. We have wrapped him in the past but again that was something else that he really was not a big fan of. 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 He seemed to do much better when he was on the steroids although honestly I understand that the steroids are not necessarily the best medication to be on long-term obviously 06/14/2019 on evaluation today patient appears to be doing actually a little bit better with regard to the overall appearance with his leg. Unfortunately he does continue to have issues with what appears to be some fluid underneath  the muscle although he did see the wound specialty center at [facility] last week their main goals were to see about infusion therapy in place of the Humira as they feel like that is not quite strong enough. They also recommended that we continue with the treatment otherwise as we are they felt like that was appropriate and they are okay with him continuing to follow-up here with [place] in that regard. With that being said they are also sending him to the vein specialist there to see about vein stripping and if that would be of benefit for him. Subsequently they also did not really address whether or not an ultrasound of the muscle area to see if there is anything that needs to be addressed here would be appropriate or not. For that reason I discussed this with him last week I  think we may proceed down that road at this point. 06/21/2019 upon evaluation today patient's wound actually appears to be doing slightly better compared to previous evaluations. I do believe that he has made a difference with regard to the progression here with the use of oral steroids. Again in the past has been the only thing that is really calm things down. He does tell me that from [facility] is gotten a good news from there that there are no further vein stripping that is necessary at this point. I do not have that available for review today although the patient did relay this to me. He also did obtain and have the ultrasound of the wound completed which I did sign off on today. It does appear that there is no fluid collection under the muscle this is likely then just edematous tissue in general. That is also good news. Overall I still believe the inflammation is the main issue here. He did inquire about the possibility of a wound VAC again with the muscle protruding like it is I am not really sure whether the wound VAC is necessarily ideal or not. That is something we will have to consider although I do believe he may need compression  wrapping to try to help with edema control which could potentially be of benefit. 06/28/2019 on evaluation today patient appears to be doing slightly better measurement wise although this is not terribly smaller he least seems to be trending towards that direction. With that being said he still seems to have purulent drainage noted in the wound bed at this time. He has been on Levaquin followed by Cipro over the past month. Unfortunately he still seems to have some issues with active infection at this time. I did perform a culture last week in order to evaluate and see if indeed there was still anything going on. Subsequently the culture did come back showing Pseudomonas which is consistent with the drainage has been having which is blue-green in color. He also has had an odor that again was somewhat consistent with Pseudomonas as well. Long story short it appears that the culture showed an intermediate finding with regard to how well the Cipro will work for the Pseudomonas infection. Subsequently being that he does not seem to be clearing up and at best what we are doing is just keeping this at [place] I think he may need to see infectious disease to discuss IV antibiotic options. BEAUREGARD, JARRELLS ([id number]) 07/05/2019 upon evaluation today patient appears to be doing okay in regard to his leg ulcer. He has been tolerating the dressing changes at this point without complication. Fortunately there is no signs of active infection at this time which is good news. No fevers, chills, nausea, vomiting, or diarrhea. With that being said he does have an appointment with infectious disease tomorrow and his primary care on Wednesday. Again the reason for the infectious disease referral was due to the fact that he did not seem to be fully resolving with the use of oral antibiotics and therefore we were thinking that IV antibiotic therapy may be necessary secondary to the fact that there was an intermediate finding for  how effective the Cipro may be. Nonetheless again he has been having a lot of purulent and even green drainage. Fortunately right now that seems to have calmed down over the past week with the reinitiation of the oral antibiotic. Nonetheless we will see what Dr. Megan Salon has to say. 07/12/2019 upon evaluation today patient appears  to be doing about the same at this point in regard to his left lower extremity ulcer. Fortunately there is no signs of active infection at this time which is good news I do believe the Levaquin has been beneficial I did review Dr. Hale Bogus note and to be honest I agree that the patient's leg does appear to be doing better currently. What we found in the past as he does not seem to really completely resolve he will stop the antibiotic and then subsequently things will revert back to having issues with blue-green drainage, increased pain, and overall worsening in general. Obviously that is the reason I sent him back to infectious disease. 07/19/2019 upon evaluation today patient appears to be doing roughly the same in size there is really no dramatic improvement. He has started back on the Levaquin at this point and though he seems to be doing okay he did still have a lot of blue/green drainage noted on evaluation today unfortunately. I think that this is still indicative more likely of a Pseudomonas infection as previously noted and again he does see Dr. Megan Salon in just a couple of days. I do not know that were really able to effectively clear this with just oral antibiotics alone based on what I am seeing currently. Nonetheless we are still continue to try to manage as best we can with regard to the patient and his wound. I do think the wrap was helpful in decreasing the edema which is excellent news. No fevers, chills, nausea, vomiting, or diarrhea. 07/26/2019 upon evaluation today patient appears to be doing slightly better with regard to the overall appearance of the muscle  there is no dark discoloration centrally. Fortunately there is no signs of active infection at this time. No fevers, chills, nausea, vomiting, or diarrhea. Patient's wound bed currently the patient did have an appointment with Dr. Megan Salon at infectious disease last week. With that being said Dr. Megan Salon the patient states was still somewhat hesitant about put him on any IV antibiotics he wanted [place] to repeat cultures today and then see where things go going forward. He does look like Dr. Megan Salon because of some improvement the patient did have with the Levaquin wanted [place] to see about repeating cultures. If it indeed grows the Pseudomonas again then he recommended a possibility of considering a PICC line placement and IV antibiotic therapy. He plans to see the patient back in 1 to 2 weeks. 08/02/2019 upon evaluation today patient appears to be doing poorly with regard to his left lower extremity. We did get the results of his culture back it shows that he is still showing evidence of Pseudomonas which is consistent with the purulent/blue-green drainage that he has currently. Subsequently the culture also shows that he now is showing resistance to the oral fluoroquinolones which is unfortunate as that was really the only thing to treat the infection prior. I do believe that he is looking like this is going require IV antibiotic therapy to get this under control. Fortunately there is no signs of systemic infection at this time which is good news. The patient does see Dr. Megan Salon tomorrow. 08/09/2019 upon evaluation today patient appears to be doing better with regard to his left lower extremity ulcer in regard to the overall appearance. He is currently on IV antibiotic therapy. As ordered by Dr. Megan Salon. Currently the patient is on ceftazidime which she is going to take for the next 2 weeks and then follow-up for 4 to 5-week appointment with  Dr. Megan Salon. The patient started this this past Friday  symptoms have not for a total of 3 days currently in full. 08/16/2019 upon evaluation today patient's wound actually does show muscle in the base of the wound but in general does appear to be much better as far as the overall evidence of infection is concerned. In fact I feel like this is for the most part cleared up he still on the IV antibiotics he has not completed the full course yet but I think he is doing much better which is excellent news. 08/23/2019 upon evaluation today patient appears to be doing about the same with regard to his wound at this point. He tells me that he still has pain unfortunately. Fortunately there is no evidence of systemic infection at this time which is great news. There is significant muscle protrusion. 09/13/19 upon evaluation today patient appears to be doing about the same in regard to his leg unfortunately. He still has a lot of drainage coming from the ulceration there is still muscle exposed. With that being said the patient's last wound culture still showed an intermediate finding with regard to the Pseudomonas he still having the bluish/green drainage as well. Overall I do not know that the wound has completely cleared of infection at this point. Fortunately there is no signs of active infection systemically at this point which is good news. 09/20/2019 upon evaluation today patient's wound actually appears to be doing about the same based on what I am seeing currently. I do not see any signs of systemic infection he still does have evidence of some local infection and drainage. He did see Dr. Megan Salon last week and Dr. Megan Salon states that he probably does need a different IV antibiotic although he does not want to put him on this until the patient begins the Remicade infusion which is actually scheduled for about 10 days out from today on 13 May. Following that time Dr. Megan Salon is good to see him back and then will evaluate the feasibility of starting him on the  IV antibiotic therapy once again at that point. I do not disagree with this plan I do believe as Dr. Megan Salon stated in his note that I reviewed today that the patient's issue is multifactorial with the pyoderma being 1 aspect of this that were hoping the Remicade will be helpful for her. In the meantime I think the gentamicin is, helping to keep things under decent okay control in regard to the ulcer. 09/27/2019 upon evaluation today patient appears to be doing about the same with regard to his wound still there is a lot of muscle exposure though he does have some hyper granulation tissue noted around the edge and actually some granulation tissue starting to form over the muscle which is actually good news. Fortunately there is no evidence of active infection which is also good news. His pain is less at this point. 5/21; this is a patient I have not seen in a long time. 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 He has a large wound on the left lateral leg with protruding muscle. He comes in the clinic today showing the same area on his left medial ankle. He says there is been a spot there for some time although we have not previously defined this. Today he has a clearly defined area with slight amount of skin breakdown surrounded by raised areas with a purplish hue in color. This is not painful he says it  is irritated. 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 He still has muscle protruding from the lateral portion of his left leg but fortunately the new area that they were concerned about at his last visit does not appear to have opened at this point. He is currently on Remicade infusions and seems to be doing better in my opinion in fact the wound itself seems to be overall much better. The purplish discoloration that he did have seems to have resolved  and I think that is a good sign that hopefully the Remicade is doing its job. He does have some biofilm noted over the surface of the wound. 11/01/2019 on evaluation today patient's wound actually appears to be doing excellent at this time. Fortunately there is no evidence of active infection and overall I feel like he is making great progress. The Remicade seems to be due excellent job in my opinion. DAHIR, AYER ([id number]) 11/08/19 evaluation today vision actually appears to be doing quite well with regard to his weight ulcer. He's been tolerating dressing changes without complication. Fortunately there is no evidence of infection. No fevers, chills, nausea, or vomiting noted at this time. Overall states that is having more itching than pain which is actually a good sign in my opinion. 12/13/2019 upon evaluation today patient appears to be doing well today with regard to his wound. He has been tolerating the dressing changes without complication. Fortunately there is no sign of active infection at this time. No fevers, chills, nausea, vomiting, or diarrhea. Overall I feel like the infusion therapy has been very beneficial for him. Electronic Signature(s) Signed: 12/13/2019 9:48:52 AM By: Worthy Keeler PA-C Entered By: Worthy Keeler on 12/13/2019 09:48:51 SCHNEIDER, WARCHOL ([id number]) -------------------------------------------------------------------------------- Physical Exam Details Patient Name: [name] Date of Service: 12/13/2019 9:00 AM Medical Record Number: [id number] Patient Account Number: [id number] Date of Birth/Sex: [birth year] (39 y.o. M) Treating RN: [name] Primary Care Provider: [name] Other Clinician: Referring Provider: [name] Treating Provider/Extender: Melburn Hake, Eiman Maret Weeks in Treatment: 60 Constitutional Well-nourished and well-hydrated in no acute distress. Respiratory normal breathing without difficulty. Psychiatric this patient is able  to make decisions and demonstrates good insight into disease process. Alert and Oriented x 3. pleasant and cooperative. Notes Upon inspection patient's wound bed actually showed signs of good granulation at this time. There does not appear to be any evidence of active infection overall very pleased with the epithelial growth around the edges as well and the wound does not appear to be infected although he does have some signs of irritation from the surrounding irritation where his bandage is typically. Electronic Signature(s) Signed: 12/13/2019 9:49:30 AM By: Worthy Keeler PA-C Entered By: Worthy Keeler on 12/13/2019 09:49:28 MOUSSA, WIEGAND ([id number]) -------------------------------------------------------------------------------- Physician Orders Details Patient Name: [name] Date of Service: 12/13/2019 9:00 AM Medical Record Number: [id number] Patient Account Number: [id number] Date of Birth/Sex: 07/31/1980 (39 y.o. 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  Anesthetic (add to Medication List) Wound #1 Left,Lateral Lower Leg o Topical Lidocaine 4% cream applied to wound bed prior to debridement (In Clinic Only). Primary Wound Dressing o Gentamicin Sulfate Cream - mix 1:1 with triamcinolone to apply to the wound bed Secondary Dressing Wound #1 Left,Lateral Lower Leg o Boardered Foam Dressing Dressing Change  Frequency Wound #1 Left,Lateral Lower Leg o Change dressing every day. Follow-up Appointments Wound #1 Left,Lateral Lower Leg o Return Appointment in 1 week. Edema Control Wound #1 Left,Lateral Lower Leg o Patient to wear own compression stockings o Elevate legs to the level of the heart and pump ankles as often as possible Electronic Signature(s) Signed: 12/13/2019 3:12:32 PM By: Worthy Keeler PA-C Signed: 12/13/2019 5:17:32 PM By: [name], [name], RN, [name], [name], [name] Entered By: [name], [name], RN, [name], [name] on 12/13/2019 09:20:44 [name] ([id number]) -------------------------------------------------------------------------------- Problem List Details Patient Name: [name] Date of Service: 12/13/2019 9:00 AM Medical Record Number: [id number] Patient Account Number: [id number] Date of Birth/Sex: [birth year] (39 y.o. M) Treating RN: [name] Primary Care Provider: [name] Other Clinician: Referring Provider: [name] Treating Provider/Extender: Melburn Hake, Aireanna Luellen Weeks in Treatment: 157 Active Problems ICD-10 Encounter Code Description Active Date MDM Diagnosis L97.222 Non-pressure chronic ulcer of left calf with fat layer exposed 12/04/2016 No Yes L88 Pyoderma gangrenosum 03/26/2017 No Yes L97.321 Non-pressure chronic ulcer of left ankle limited to breakdown of skin 10/08/2019 No Yes I87.2 Venous insufficiency (chronic) (peripheral) 12/04/2016 No Yes L03.116 Cellulitis of left lower limb 05/24/2019 No Yes Inactive Problems ICD-10 Code Description Active Date Inactive Date L97.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 M) Treating RN: [name] Primary Care Provider: [name] Other Clinician: Referring Provider: [name] Treating Provider/Extender: Melburn Hake, Advik Weatherspoon Weeks in Treatment: 157 Subjective Chief Complaint Information obtained from Patient He is here in follow up evaluation for LLE pyoderma ulcer History of Present Illness (HPI) 12/04/16; 39 year old man who comes into the clinic today for review of a wound on the posterior left calf. He tells me that is been there for about a year. He is not a diabetic he does smoke half a pack per day. He was seen in the ER on 11/20/16 felt to have cellulitis around the wound and was given clindamycin. An x-ray did not show osteomyelitis. The patient initially tells me that he has a milk allergy that sets off a pruritic itching rash on his lower legs which she scratches incessantly and he thinks that's what may have set up the wound. He has been using various topical antibiotics and ointments without any effect. He works in a [employer] and is on his feet all day. He does not have a prior history of wounds however he does have the rash on both lower legs the right arm and the ventral aspect of his left arm. These are excoriations and clearly have had scratching however there are of macular looking areas on both legs including a substantial larger area on the right leg. This does not have an underlying open area. There is no blistering. The patient tells me that 2 years ago in [place] in response to the rash on  his legs he saw a dermatologist who told him he had a condition which may be pyoderma gangrenosum although I may be putting words into his mouth. He seemed to recognize this. 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 He is not in any treatment for  this. He's had no recent travel 12/11/16; the patient arrives today with his wound and roughly the same condition we've been using silver alginate this is a deep punched out wound with some surrounding erythema but no tenderness. Biopsy I did did not show confirmed pyoderma gangrenosum suggested nonspecific inflammation and vasculitis but does not provide an actual description of what was seen by the pathologist. I'm really not able to understand this We have also received information from the patient's dermatologist in [place] notes from April 2016. This was a doctor Agarwal-antal. 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 He was prescribed topical steroid high potency under occlusion which helped but at this point the patient did not have a deep punched out wound. 12/18/16; the patient's wound is larger in terms of surface area however this surface looks better and there is less depth. The surrounding erythema also is better. The patient states that the wrap we put on came off 2 days ago when he has been using his compression stockings. He we are in the process of getting a dermatology consult. 12/26/16 on evaluation today patient's left lower extremity wound shows evidence of infection with surrounding erythema noted. He has been tolerating the dressing changes but states that he has noted more discomfort. There is a larger area of erythema surrounding the wound. No fevers, chills, nausea, or vomiting noted at this time. With that being said the wound still does have slough covering the surface. He is not allergic to any medication that he is aware of at this point. In regard to his right lower extremity he had several regions that are erythematous and pruritic he wonders if there's anything we can do to help that. 01/02/17 I reviewed patient's wound culture which was obtained his visit last week. He was placed on doxycycline at that point. Unfortunately that does not appear to be an  antibiotic that would likely help with the situation however the pseudomonas noted on culture is sensitive to Cipro. Also unfortunately patient's wound seems to have a large compared to last week's evaluation. Not severely so but there are definitely increased measurements in general. He is continuing to have discomfort as well he writes this to be a seven out of 10. In fact he would prefer me not to perform any debridement today due to the fact that he is having discomfort and considering he has an active infection on the little reluctant to do so anyway. No fevers, chills, nausea, or vomiting noted at this time. 01/08/17; patient seems dermatology on September 5. I suspect dermatology will want the slides from the biopsy I did sent to their pathologist. I'm not sure if there is a way we can expedite that. In any case the culture I did before I left on vacation 3 weeks ago showed Pseudomonas he was given 10 days of Cipro and per her description of her intake nurses is actually somewhat better this week although the wound is quite a bit bigger than I remember the last time I saw this. He still has 3 more days of Cipro 01/21/17; dermatology appointment tomorrow. He has completed the ciprofloxacin for Pseudomonas. Surface of the wound looks better  however he is had some deterioration in the lesions on his right leg. Meantime the left lateral leg wound we will continue with sample 01/29/17; patient had his dermatology appointment but I can't yet see that note. He is completed his antibiotics. The wound is more superficial but considerably larger in circumferential area than when he came in. This is in his left lateral calf. He also has swollen erythematous areas with superficial wounds on the right leg and small papular areas on both arms. There apparently areas in her his upper thighs and buttocks I did not look at those. Dermatology biopsied the right leg. Hopefully will have their input next  week. 02/05/17; patient went back to see his dermatologist who told him that he had a "scratching problem" as well as staph. He is now on a 30 day course of doxycycline and I believe she gave him triamcinolone cream to the right leg areas to help with the itching [not exactly sure but probably triamcinolone]. She apparently looked at the left lateral leg wound although this was not rebiopsied and I think felt to be ultimately part of the same pathogenesis. He is using sample border foam and changing nevus himself. He now has a new open area on the right posterior leg which was his biopsy site I don't have any of the dermatology notes 02/12/17; we put the patient in compression last week with SANTYL to the wound on the left leg and the biopsy. Edema is much better and the depth of the wound is now at level of skin. Area is still the same Biopsy site on the right lateral leg we've also been using santyl with a border foam dressing and he is changing this himself. 02/19/17; Using silver alginate started last week to both the substantial left leg wound and the biopsy site on the right wound. He is tolerating compression well. Has a an appointment with his primary M.D. tomorrow wondering about diuretics although I'm wondering if the edema problem is actually lymphedema QUINTYN, DOMBEK ([id number]) 02/26/17; the patient has been to see his primary doctor Dr. Jerrel Ivory at [facility] our primary care. She started him on Lasix 20 mg and this seems to have helped with the edema. However we are not making substantial change with the left lateral calf wound and inflammation. The biopsy site on the right leg also looks stable but not really all that different. 03/12/17; the patient has been to see vein and vascular Dr. Lucky Cowboy. He has had venous reflux studies I have not reviewed these. I did get a call from his dermatology office. They felt that he might have pathergy based on their biopsy on his right leg which led  them to look at the slides of the biopsy I did on the left leg and they wonder whether this represents pyoderma gangrenosum which was the original supposition in a man with ulcerative colitis albeit inactive for many years. They therefore recommended clobetasol and tetracycline i.e. 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 03/26/17; apparently the patient just had reflux studies not an appointment with Dr. dew. She arrives in clinic today having applied clobetasol for 2-3 weeks. He notes over the last 2-3 days excessive drainage having to change the dressing 3-4 times a day and also expanding erythema. He states the expanding erythema seems to come and go and was last this red was earlier in the month.he is on doxycycline 150 mg twice a day as an anti-inflammatory systemic therapy for possible pyoderma gangrenosum along with  the topical clobetasol 04/02/17; the patient was seen last week by Dr. Lillia Carmel at [facility] dermatology locally who kindly saw him at my request. 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 My concern was the degree of erythema medially extending from his left leg wound which was either inflammation from pyoderma or cellulitis. I put him on Augmentin however culture of the wound showed Pseudomonas which is quinolone sensitive. I really don't believe he has cellulitis however in view of everything I will continue and give him a course of Cipro. He is also on doxycycline as an immune modulator for the pyoderma. In addition to his original wound on the left lateral leg with surrounding erythema he has a wound on the right posterior calf which was an original biopsy site done by dermatology. 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 Saw Dr. Lillia Carmel yesterday. He has been using topical antibiotics to both wound areas his original wound on the left and the  biopsies/pathergy area on the right. There is definitely some improvement in the inflammation around the wound on the right although the patient states he has increasing sensitivity of the wounds. He is on prednisone 60 and doxycycline 1 as prescribed by Dr. Lillia Carmel. He is covering the topical antibiotic with gauze and putting this in his own compression stocks and changing this daily. He states that Dr. Lottie Rater did a culture of the left leg wound yesterday 05/07/17; pyoderma gangrenosum. The patient saw Dr. Lillia Carmel yesterday and has a follow-up with her in one month. He is still using topical antibiotics to both wounds although he can't recall exactly what type. He is still on prednisone 60 mg. Dr. Lillia Carmel stated that the doxycycline could stop if we were in agreement. 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 The right medial leg was induced by biopsy/pathergy. The area on the right is essentially healed. 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 The right medial leg has closed and remains closed. He is still on prednisone 60. He tells me he missed his last dermatology appointment with Dr. Lillia Carmel but will make another appointment. He reports that her blood sugar at a recent screen in [place] was high 200's. He was 180 today. 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 I think he is going to require still much longer prednisone perhaps another 3 months before attempting to taper. In the meantime his wound is a lot better. Smaller. He is cleaning this off daily and applying topical antibiotics. When he was last in the clinic I thought about changing to [facility] and actually put in a couple of calls to dermatology although probably not during their business hours. In any case the wound  looks better smaller I don't think there is any need to change what he is doing 08/06/17-he is here in follow up evaluation for pyoderma left leg ulcer. He continues on oral prednisone. He has been using triple antibiotic ointment. There is surface debris and we will transition to [facility] and have him return in 2 weeks. He has lost 30 pounds since his last appointment with lifestyle modification. He may benefit from topical steroid cream for treatment this can be considered at a later date. 08/22/17 on evaluation today patient appears to actually  be doing rather well in regard to his left lateral lower extremity ulcer. He has actually been managed by Dr. Dellia Nims most recently. Patient is currently on oral steroids at this time. This seems to have been of benefit for him. Nonetheless his last visit was actually with Leah on 08/06/17. Currently he is not utilizing any topical steroid creams although this could be of benefit as well. No fevers, chills, nausea, or vomiting noted at this time. 09/05/17 on evaluation today patient appears to be doing better in regard to his left lateral lower extremity ulcer. He has been tolerating the dressing changes without complication. He is using Santyl with good effect. Overall I'm very pleased with how things are standing at this point. Patient likewise is happy that this is doing better. 09/19/17 on evaluation today patient actually appears to be doing rather well in regard to his left lateral lower extremity ulcer. 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 He's not having any significant pain. 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 He has been tolerating the Santyl without complication and in general I feel like he's making good progress. 10/17/17 on evaluation today patient appears  to be doing very well in regard to his left lateral lower surety ulcer. He has been tolerating the dressing changes without complication. There does not appear to be any evidence of infection he's alternating the Santyl and the triple antibiotic ointment every other day this seems to be doing well for him. 11/03/17 on evaluation today patient appears to be doing very well in regard to his left lateral lower extremity ulcer. He is been tolerating the dressing changes without complication which is good news. Fortunately there does not appear to be any evidence of infection which is also great news. Overall is doing excellent they are starting to taper down on the prednisone is down to 40 mg at this point it also started topical clobetasol for him. 11/17/17 on evaluation today patient appears to be doing well in regard to his left lateral lower surety ulcer. He's been tolerating the dressing changes without complication. He does note that he is having no pain, no excessive drainage or discharge, and overall he feels like things are going about how he would expect and hope they would. Overall he seems to have no evidence of infection at this time in my opinion which is good news. 12/04/17-He is seen in follow-up evaluation for right lateral lower extremity ulcer. He has been applying topical steroid cream. Today's measurement show slight increase in size. Over the next 2 weeks we will transition to every other day Santyl and steroid cream. He has been encouraged to monitor for changes and notify clinic with any concerns 12/15/17 on evaluation today patient's left lateral motion the ulcer and fortunately is doing worse again at this point. This just since last week to this week has close to doubled in size according to the patient. I did not seeing last week's I do not have a visual to compare this to in our system was also down so we do not have all the charts and at this point. Nonetheless it does have me  somewhat concerned in regard to the fact that again he was worried enough about it he has contact the dermatology that placed them back on the full strength, 50  mg a day of the prednisone that he was taken previous. He continues to alternate using clobetasol along with Santyl at this point. He is obviously somewhat frustrated. HAYLEN, SHELNUTT ([id number]) 12/22/17 on evaluation today patient appears to be doing a little worse compared to last evaluation. Unfortunately the wound is a little deeper and slightly larger than the last week's evaluation. With that being said he has made some progress in regard to the irritation surrounding at this time unfortunately despite that progress that's been made he still has a significant issue going on here. 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 For that reason I discussed with him today the possibility of perform a wound culture to ensure there's nothing overtly infected. 01/06/18 on evaluation today patient's wound is larger and deeper than previously evaluated. With that being said it did appear that his wound was infected after my last evaluation with him. Subsequently I did end up prescribing a prescription for Bactrim DS which she has been taking and having no complication with. Fortunately there does not appear to be any evidence of infection at this point in time as far as anything spreading, no want to touch, and overall I feel like things are showing signs of improvement. 01/13/18 on evaluation today patient appears to be even a little larger and deeper than last time. There still muscle exposed in the base of the wound. Nonetheless he does appear to be less erythematous I do believe inflammation is calming down also believe the infection looks like it's probably resolved at this time based on what I'm  seeing. No fevers, chills, nausea, or vomiting noted at this time. 01/30/18 on evaluation today patient actually appears to visually look better for the most part. Unfortunately those visually this looks better he does seem to potentially have what may be an abscess in the muscle that has been noted in the central portion of the wound. This is the first time that I have noted what appears to be fluctuance in the central portion of the muscle. With that being said I'm somewhat more concerned about the fact that this might indicate an abscess formation at this location. I do believe that an ultrasound would be appropriate. This is likely something we need to try to do as soon as possible. He has been switch to mupirocin ointment and he is no longer using the steroid ointment as prescribed by dermatology he sees them again next week he's been decreased from 60 to 40 mg of prednisone. 03/09/18 on evaluation today patient actually appears to be doing a little better compared to last time I saw him. There's not as much erythema surrounding the wound itself. He I did review his most recent infectious disease note which was dated 02/24/18. He saw Dr. Michel Bickers in [place]. 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 Patient is now off of antibiotics and they are continually observing this. There seems to be no change in the past two weeks in my pinion based on what the patient says and what I see today compared to what Dr. Megan Salon likely saw two weeks ago. No fevers, chills, nausea, or vomiting noted at this time. 03/23/18 on evaluation today patient's wound actually appears to be showing signs of improvement which is  good news. He is currently still on the Dapsone. He is also working on tapering the prednisone to get off of this and Dr. Lottie Rater is working with him in this regard. Nonetheless overall I feel like the  wound is doing well it does appear based on the infectious disease note that I reviewed from Dr. 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 I did also review the results of his ultrasound of left lower extremity which revealed there was a dentist tissue in the base of the wound without an abscess noted. 04/06/18 on evaluation today the patient's left lateral lower extremity ulcer actually appears to be doing fairly well which is excellent news. There does not appear to be any evidence of infection at this time which is also great news. Overall he still does have a significantly large ulceration although little by little he seems to be making progress. He is down to 10 mg a day of the prednisone. 04/20/18 on evaluation today patient actually appears to be doing excellent at this time in regard to his left lower extremity ulcer. He's making signs of good progress unfortunately this is taking much longer than we would really like to see but nonetheless he is making progress. Fortunately there does not appear to be any evidence of infection at this time. No fevers, chills, nausea, or vomiting noted at this time. The patient has not been using the Santyl due to the cost he hadn't got in this field yet. He's mainly been using the antibiotic ointment topically. Subsequently he also tells me that he really has not been scrubbing in the shower I think this would be helpful again as I told him it doesn't have to be anything too aggressive to even make it believe just enough to keep it free of some of the loose slough and biofilm on the wound surface. 05/11/18 on evaluation today patient's wound appears to be making slow but sure progress in regard to the left lateral lower extremity ulcer. He is been tolerating the dressing changes without complication. Fortunately there does not appear to be any  evidence of infection at this time. He is still just using triple antibiotic ointment along with clobetasol occasionally over the area. He never got the Santyl and really does not seem to intend to in my pinion. 06/01/18 on evaluation today patient appears to be doing a little better in regard to his left lateral lower extremity ulcer. He states that overall he does not feel like he is doing as well with the Dapsone as he did with the prednisone. Nonetheless he sees his dermatologist later today and is gonna talk to them about the possibility of going back on the prednisone. Overall again I believe that the wound would be better if you would utilize Santyl but he really does not seem to be interested in going back to the [place] at this point. He has been using triple antibiotic ointment. 06/15/18 on evaluation today patient's wound actually appears to be doing about the same at this point. Fortunately there is no signs of infection at this time. He has made slight improvements although he continues to not really want to clean the wound bed at this point. He states that he just doesn't mess with it he doesn't want to cause any problems with everything else he has going on. He has been on medication, antibiotics  as prescribed by his dermatologist, for a staff infection of his lower extremities which is really drying out now and looking much better he tells me. Fortunately there is no sign of overall infection. 06/29/18 on evaluation today patient appears to be doing well in regard to his left lateral lower surety ulcer all things considering. Fortunately his staff infection seems to be greatly improved compared to previous. He has no signs of infection and this is drying up quite nicely. He is still the doxycycline for this is no longer on cental, Dapsone, or any of the other medications. His dermatologist has recommended possibility of an infusion but right now he does not want to proceed with  that. 07/13/18 on evaluation today patient appears to be doing about the same in regard to his left lateral lower surety ulcer. Fortunately there's no signs of infection at this time which is great news. Unfortunately he still builds up a significant amount of Slough/biofilm of the surface of the wound he still is not really cleaning this as he should be appropriately. Again I'm able to easily with saline and gauze remove the majority of this on the surface which if you would do this at home would likely be a dramatic improvement for him as far as getting the area to improve. Nonetheless overall I still feel like he is making progress is just very slow. I think Santyl will be of benefit for him as well. Still he has not gotten this as of this point. 07/27/18 on evaluation today patient actually appears to be doing little worse in regards of the erythema around the periwound region of the wound he also tells me that he's been having more drainage currently compared to what he was experiencing last time I saw him. He states not quite as bad as what he had because this was infected previously but nonetheless is still appears to be doing poorly. Fortunately there is no evidence of systemic infection at this point. The patient tells me that he is not going to be able to afford the Santyl. He is still waiting to hear about the infusion therapy with his dermatologist. Apparently she wants an updated colonoscopy first. ROHIN, KREJCI ([id number]) 08/10/18 on evaluation today patient appears to be doing better in regard to his left lateral lower extremity ulcer. Fortunately he is showing signs of improvement in this regard he's actually been approved for Remicade infusion's as well although this has not been scheduled as of yet. Fortunately there's no signs of active infection at this time in regard to the wound although he is having some issues with infection of the right lower extremity is been seen as  dermatologist for this. Fortunately they are definitely still working with him trying to keep things under control. 09/07/18 on evaluation today patient is actually doing rather well in regard to his left lateral lower extremity ulcer. He notes these actually having some hair grow back on his extremity which is something he has not seen in years. He also tells me that the pain is really not giving them any trouble at this time which is also good news overall she is very pleased with the progress he's using a combination of the mupirocin along with the probate is all mixed. 09/21/18 on evaluation today patient actually appears to be doing fairly well all things considered in regard to his looks from the ulcer. He's been tolerating the dressing changes without complication. Fortunately there's no signs of active infection at this time  which is good news he is still on all antibiotics or prevention of the staff infection. He has been on prednisone for time although he states it is gonna contact his dermatologist and see if she put them on a short course due to some irritation that he has going on currently. Fortunately there's no evidence of any overall worsening this is going very slow I think cental would be something that would be helpful for him although he states that $50 for tube is quite expensive. He therefore is not willing to get that at this point. 10/06/18 on evaluation today patient actually appears to be doing decently well in regard to his left lateral leg ulcer. He's been tolerating the dressing changes without complication. Fortunately there's no signs of active infection at this time. Overall I'm actually rather pleased with the progress he's making although it's slow he doesn't show any signs of infection and he does seem to be making some improvement. I do believe that he may need a switch up and dressings to try to help this to heal more appropriately and quickly. 10/19/18 on evaluation  today patient actually appears to be doing better in regard to his left lateral lower extremity ulcer. This is shown signs of having much less Slough buildup at this point due to the fact he has been using the [employer]. Obviously this is very good news. The overall size of the wound is not dramatically smaller but again the appearance is. 11/02/18 on evaluation today patient actually appears to be doing quite well in regard to his lower [place] ulcer. A lot of the skin around the ulcer is actually somewhat irritating at this point this seems to be more due to the dressing causing irritation from the adhesive that anything else. Fortunately there is no signs of active infection at this time. 11/24/18 on evaluation today patient appears to be doing a little worse in regard to his overall appearance of his lower extremity ulcer. There's more erythema and warmth around the wound unfortunately. He is currently on doxycycline which he has been on for some time. With that being said I'm not sure that seems to be helping with what appears to possibly be an acute cellulitis with regard to his left lower extremity ulcer. No fevers, chills, nausea, or vomiting noted at this time. 12/08/18 on evaluation today patient's wounds actually appears to be doing significantly better compared to his last evaluation. He has been using Santyl along with alternating tripling about appointment as well as the steroid cream seems to be doing quite well and the wound is showing signs of improvement which is excellent news. Fortunately there's no evidence of infection and in fact his culture came back negative with only normal skin flora noted. 12/21/2018 upon evaluation today patient actually appears to be doing excellent with regard to his ulcer. This is actually the best that I have seen it since have been helping to take care of him. It is both smaller as well as less slough noted on the surface of the wound and seems to be  showing signs of good improvement with new skin growing from the edges. He has been using just the triamcinolone he does wonder if he can get a refill of that ointment today. 01/04/2019 upon evaluation today patient actually appears to be doing well with regard to his left lateral lower extremity ulcer. With that being said it does not appear to be that he is doing quite as well as last time  as far as progression is concerned. There does not appear to be any signs of infection or significant irritation which is good news. With that being said I do believe that he may benefit from switching to a collagen based dressing based on how clean The wound appears. 01/18/2019 on evaluation today patient actually appears to be doing well with regard to his wound on the left lower extremity. He is not made a lot of progress compared to where we were previous but nonetheless does seem to be doing okay at this time which is good news. There is no signs of active infection which is also good news. My only concern currently is I do wish we can get him into utilizing the collagen dressing his insurance would not pay for the supplies that we ordered although it appears that he may be able to order this through his supply company that he typically utilizes. This is [employer]. Nonetheless he did try to order it during the office visit today and it appears this did go through. We will see if he can get that it is a different brand but nonetheless he has collagen and I do think will be beneficial. 02/01/2019 on evaluation today patient actually appears to be doing a little worse today in regard to the overall size of his wounds. Fortunately there is no signs of active infection at this time. That is visually. Nonetheless when this is happened before it was due to infection. For that reason were somewhat concerned about that this time as well. 02/08/2019 on evaluation today patient unfortunately appears to be doing slightly  worse with regard to his wound upon evaluation today. Is measuring a little deeper and a little larger unfortunately. I am not really sure exactly what is causing this to enlarge he actually did see his dermatologist she is going to see about initiating Humira for him. Subsequently she also did do steroid injections into the wound itself in the periphery. Nonetheless still nonetheless he seems to be getting a little bit larger he is gone back to just using the steroid cream topically which I think is appropriate. I would say hold off on the collagen for the time being is definitely a good thing to do. Based on the culture results which we finally did get the final result back regarding it shows staph as the bacteria noted again that can be a normal skin bacteria based on the fact however he is having increased drainage and worsening of the wound measurement wise I would go ahead and place him on an antibiotic today I do believe for this. 02/15/2019 on evaluation today patient actually appears to be doing somewhat better in regard to his ulcer. There is no signs of worsening at this time I did review his culture results which showed evidence of Staphylococcus aureus but not MRSA. Again this could just be more related to the normal skin bacteria although he states the drainage has slowed down quite a bit he may have had a mild infection not just colonization. And was much smaller and then since around10/04/2019 on evaluation today patient appears to be doing unfortunately worse as far as the size of the wound. I really feel like that this is steadily getting larger again it had been doing excellent right at the beginning of September we have seen a steady increase in the area of the wound it is almost 2-1/2 times the size it was on September 1. Obviously this is a bad trend this  is not wanting to see. For that reason we went back to using just the topical triamcinolone cream which does seem to help with  inflammation. I checked him for bacteria by way of culture and nothing showed positive there. I am considering giving him a short course of a tapering steroid Kehinde Bowdish, Yosgart ([id number]) today to see if that is can be beneficial for him. The patient is in agreement with giving that a try. 03/08/2019 on evaluation today patient appears to be doing very well in comparison to last evaluation with regard to his lower extremity ulcer. This is showing signs of less inflammation and actually measuring slightly smaller compared to last time every other week over the past month and a half he has been measuring larger larger larger. Nonetheless I do believe that the issue has been inflammation the prednisone does seem to have been beneficial for him which is good news. No fevers, chills, nausea, vomiting, or diarrhea. 03/22/2019 on evaluation today patient appears to be doing about the same with regard to his leg ulcer. He has been tolerating the dressing changes without complication. With that being said the wound seems to be mostly arrested at its current size but really is not making any progress except for when we prescribed the prednisone. He did show some signs of dropping as far as the overall size of the wound during that interval week. Nonetheless this is something he is not on long-term at this point and unfortunately I think he is getting need either this or else the Humira which his dermatologist has discussed try to get approval for. With that being said he will be seeing his dermatologist on the 11th of this month that is November. 04/19/2019 on evaluation today patient appears to be doing really about the same the wound is measuring slightly larger compared to last time I saw him. He has not been into the office since November 2 due to the fact that he unfortunately had Covid as that his entire family. He tells me that it was rough but they did pull-through and he seems to be doing much  better. Fortunately there is no signs of active infection at this time. No fevers, chills, nausea, vomiting, or diarrhea. 05/10/2019 on evaluation today patient unfortunately appears to be doing significantly worse as compared to last time I saw him. He does tell me that he has had his first dose of Humira and actually is scheduled to get the next one in the upcoming week. With that being said he tells me also that in the past several days he has been having a lot of issues with green drainage she showed me a picture this is more blue-green in color. He is also been having issues with increased sloughy buildup and the wound does appear to be larger today. Obviously this is not the direction that we want everything to take based on the starting of his Humira. Nonetheless I think this is definitely a result of likely infection and to be honest I think this is probably Pseudomonas causing the infection based on what I am seeing. 05/24/2019 on evaluation today patient unfortunately appears to be doing significantly worse compared to his prior evaluation with me 2 weeks ago. I did review his culture results which showed that he does have Staph aureus as well as Pseudomonas noted on the culture. Nonetheless the Levaquin that I prescribed for him does not appear to have been appropriate and in fact he tells me he is  no longer experiencing the green drainage and discharge that he had at the last visit. Fortunately there is no signs of active infection at this time which is good news although the wound has significantly worsened it in fact is much deeper than it was previous. We have been utilizing up to this point triamcinolone ointment as the prescription topical of choice but at this time I really feel like that the wound is getting need to be packed in order to appropriately manage this due to the deeper nature of the wound. Therefore something along the lines of an alginate dressing may be more  appropriate. 05/31/2019 upon inspection today patient's wound actually showed signs of doing poorly at this point. Unfortunately he just does not seem to be making any good progress despite what we have tried. He actually did go ahead and pick up the Cipro and start taking that as he was noticing more green drainage he had previously completed the Levaquin that I prescribed for him as well. Nonetheless he missed his appointment for the seventh last week on Wednesday with the wound care center and [facility] where his dermatologist referred him. Obviously I do think a second opinion would be helpful at this point especially in light of the fact that the patient seems to be doing so poorly despite the fact that we have tried everything that I really know how at this point. The only thing that ever seems to have helped him in the past is when he was on high doses of continual steroids that did seem to make a difference for him. 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 06/07/2019 upon evaluation today patient unfortunately appears to be doing worse yet again with regard to his wound. In fact I am starting to question whether or not he may have a fluid pocket in the muscle at this point based on the bulging and the soft appearance to the central portion of the muscle area. There is not anything draining from the muscle itself at this time which is good news but nonetheless the wound is expanding. I am not really seeing any results of the Humira as far as overall wound progression based on what I am seeing at this point. The patient has been referred for second opinion with regard to his wound to the [facility] wound care center by his dermatologist which I definitely am not in opposition to. Unfortunately we tried multiple dressings in the past  including collagen, alginate, and at one point even Hydrofera Blue. With that being said he is never really used it for any significant amount of time due to the fact that he often complains of pain associated with these dressings and then will go back to either using the Santyl which she has done intermittently or more frequently the triamcinolone. He is also using his own compression stockings. We have wrapped him in the past but again that was something else that he really was not a big fan of. 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 He seemed to do much better when he was on the steroids although honestly I understand that the steroids are not necessarily the best medication to be on long-term obviously 06/14/2019 on evaluation today patient appears to be doing actually a little bit better with regard to the overall appearance with his leg. Unfortunately he does continue to have issues with what appears to be some fluid underneath the muscle although he did see the wound specialty center at [facility] last week their main goals were to see about infusion therapy in place of the Humira as they feel like that is not quite strong enough. They also recommended that we continue with the treatment otherwise as we are they felt like that was appropriate and they are okay with him continuing to follow-up here with [place] in that regard. With that being said they are also sending him to the vein specialist there to see about vein stripping and if that would be of benefit for him. Subsequently they also did not really address whether or not an ultrasound of the muscle area to see if there is  anything that needs to be addressed here would be appropriate or not. For that reason I discussed this with him last week I think we may proceed down that road at this point. 06/21/2019 upon evaluation today patient's wound actually appears to be doing slightly better compared to previous evaluations. I do believe that he has made a difference with regard to the progression here with the use of oral steroids. Again in the past has been the only thing that is really calm things down. He does tell me that from [facility] is gotten a good news from there that there are no further vein stripping that is necessary at this point. I do not have that available for review today although the patient did relay this to me. He also did obtain and have the ultrasound of the wound completed which I did sign off on today. It does appear that there is no fluid collection under the muscle this is likely then just edematous tissue in general. That is also good news. Overall I still believe the inflammation is the main issue here. He did inquire about the possibility of a wound VAC again with the muscle protruding like it is I am not really sure whether the wound VAC is necessarily ideal or not. That is something we will have to consider although I do believe he may need compression wrapping to try to help with edema control which could potentially be of benefit. 06/28/2019 on evaluation today patient appears to be doing slightly better measurement wise although this is not terribly smaller he least seems to be trending towards that direction. With that being said he still seems to have purulent drainage noted in the wound bed at this time. He has been on Levaquin followed by Cipro over the past month. Unfortunately he still seems to have some issues with active infection at this time. I did perform a culture last week in order to evaluate and see if indeed there was still anything going on. Subsequently the culture did come  back Nikkel, Bernerd ([id number]) showing Pseudomonas which is consistent with the drainage has been having which is blue-green in color. He also has had an odor that again was somewhat consistent with Pseudomonas as well. Long story short it appears that the culture showed an intermediate finding  with regard to how well the Cipro will work for the Pseudomonas infection. Subsequently being that he does not seem to be clearing up and at best what we are doing is just keeping this at [place] I think he may need to see infectious disease to discuss IV antibiotic options. 07/05/2019 upon evaluation today patient appears to be doing okay in regard to his leg ulcer. He has been tolerating the dressing changes at this point without complication. Fortunately there is no signs of active infection at this time which is good news. No fevers, chills, nausea, vomiting, or diarrhea. With that being said he does have an appointment with infectious disease tomorrow and his primary care on Wednesday. Again the reason for the infectious disease referral was due to the fact that he did not seem to be fully resolving with the use of oral antibiotics and therefore we were thinking that IV antibiotic therapy may be necessary secondary to the fact that there was an intermediate finding for how effective the Cipro may be. Nonetheless again he has been having a lot of purulent and even green drainage. Fortunately right now that seems to have calmed down over the past week with the reinitiation of the oral antibiotic. Nonetheless we will see what Dr. Megan Salon has to say. 07/12/2019 upon evaluation today patient appears to be doing about the same at this point in regard to his left lower extremity ulcer. Fortunately there is no signs of active infection at this time which is good news I do believe the Levaquin has been beneficial I did review Dr. Hale Bogus note and to be honest I agree that the patient's leg does appear to be doing  better currently. What we found in the past as he does not seem to really completely resolve he will stop the antibiotic and then subsequently things will revert back to having issues with blue-green drainage, increased pain, and overall worsening in general. Obviously that is the reason I sent him back to infectious disease. 07/19/2019 upon evaluation today patient appears to be doing roughly the same in size there is really no dramatic improvement. He has started back on the Levaquin at this point and though he seems to be doing okay he did still have a lot of blue/green drainage noted on evaluation today unfortunately. I think that this is still indicative more likely of a Pseudomonas infection as previously noted and again he does see Dr. Megan Salon in just a couple of days. I do not know that were really able to effectively clear this with just oral antibiotics alone based on what I am seeing currently. Nonetheless we are still continue to try to manage as best we can with regard to the patient and his wound. I do think the wrap was helpful in decreasing the edema which is excellent news. No fevers, chills, nausea, vomiting, or diarrhea. 07/26/2019 upon evaluation today patient appears to be doing slightly better with regard to the overall appearance of the muscle there is no dark discoloration centrally. Fortunately there is no signs of active infection at this time. No fevers, chills, nausea, vomiting, or diarrhea. Patient's wound bed currently the patient did have an appointment with Dr. Megan Salon at infectious disease last week. With that being said Dr. Megan Salon the patient states was still somewhat hesitant about put him on any IV antibiotics he wanted [place] to repeat cultures today and then see where things go going forward. He does look like Dr. Megan Salon because of some improvement the patient  did have with the Levaquin wanted [place] to see about repeating cultures. If it indeed grows the Pseudomonas  again then he recommended a possibility of considering a PICC line placement and IV antibiotic therapy. He plans to see the patient back in 1 to 2 weeks. 08/02/2019 upon evaluation today patient appears to be doing poorly with regard to his left lower extremity. We did get the results of his culture back it shows that he is still showing evidence of Pseudomonas which is consistent with the purulent/blue-green drainage that he has currently. Subsequently the culture also shows that he now is showing resistance to the oral fluoroquinolones which is unfortunate as that was really the only thing to treat the infection prior. I do believe that he is looking like this is going require IV antibiotic therapy to get this under control. Fortunately there is no signs of systemic infection at this time which is good news. The patient does see Dr. Megan Salon tomorrow. 08/09/2019 upon evaluation today patient appears to be doing better with regard to his left lower extremity ulcer in regard to the overall appearance. He is currently on IV antibiotic therapy. As ordered by Dr. Megan Salon. Currently the patient is on ceftazidime which she is going to take for the next 2 weeks and then follow-up for 4 to 5-week appointment with Dr. Megan Salon. The patient started this this past Friday symptoms have not for a total of 3 days currently in full. 08/16/2019 upon evaluation today patient's wound actually does show muscle in the base of the wound but in general does appear to be much better as far as the overall evidence of infection is concerned. In fact I feel like this is for the most part cleared up he still on the IV antibiotics he has not completed the full course yet but I think he is doing much better which is excellent news. 08/23/2019 upon evaluation today patient appears to be doing about the same with regard to his wound at this point. He tells me that he still has pain unfortunately. Fortunately there is no evidence of  systemic infection at this time which is great news. There is significant muscle protrusion. 09/13/19 upon evaluation today patient appears to be doing about the same in regard to his leg unfortunately. He still has a lot of drainage coming from the ulceration there is still muscle exposed. With that being said the patient's last wound culture still showed an intermediate finding with regard to the Pseudomonas he still having the bluish/green drainage as well. Overall I do not know that the wound has completely cleared of infection at this point. Fortunately there is no signs of active infection systemically at this point which is good news. 09/20/2019 upon evaluation today patient's wound actually appears to be doing about the same based on what I am seeing currently. I do not see any signs of systemic infection he still does have evidence of some local infection and drainage. He did see Dr. Megan Salon last week and Dr. Megan Salon states that he probably does need a different IV antibiotic although he does not want to put him on this until the patient begins the Remicade infusion which is actually scheduled for about 10 days out from today on 13 May. Following that time Dr. Megan Salon is good to see him back and then will evaluate the feasibility of starting him on the IV antibiotic therapy once again at that point. I do not disagree with this plan I do believe as Dr.  Megan Salon stated in his note that I reviewed today that the patient's issue is multifactorial with the pyoderma being 1 aspect of this that were hoping the Remicade will be helpful for her. In the meantime I think the gentamicin is, helping to keep things under decent okay control in regard to the ulcer. 09/27/2019 upon evaluation today patient appears to be doing about the same with regard to his wound still there is a lot of muscle exposure though he does have some hyper granulation tissue noted around the edge and actually some granulation  tissue starting to form over the muscle which is actually good news. Fortunately there is no evidence of active infection which is also good news. His pain is less at this point. 5/21; this is a patient I have not seen in a long time. 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 He has a large wound on the left lateral leg with protruding muscle. He comes in the clinic today showing the same area on his left medial ankle. He says there is been a spot there for some time although we have not previously defined this. Today he has a clearly defined area with slight amount of skin breakdown surrounded by raised areas with a purplish hue in color. This is not painful he says it is irritated. 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 He still has muscle protruding from the lateral portion of his left leg but fortunately the new area that they were concerned about at his last visit does not appear to have opened at this point. He is currently on Remicade infusions and seems to be doing better in my opinion in fact the wound itself seems to be overall much better. The purplish discoloration that he did have seems to have resolved and I think that is a good sign that hopefully the Remicade is doing its job. He does Manuelito, Bexley ([id number]) have some biofilm noted over the surface of the wound. 11/01/2019 on evaluation today patient's wound actually appears to be doing excellent at this time. Fortunately there is no evidence of active infection and overall I feel like he is making great progress. The Remicade seems to be due excellent job in my opinion. 11/08/19 evaluation today vision actually appears to be doing quite well with regard to his weight ulcer. He's been tolerating dressing changes without complication. Fortunately there is no evidence of infection. No  fevers, chills, nausea, or vomiting noted at this time. Overall states that is having more itching than pain which is actually a good sign in my opinion. 12/13/2019 upon evaluation today patient appears to be doing well today with regard to his wound. He has been tolerating the dressing changes without complication. Fortunately there is no sign of active infection at this time. No fevers, chills, nausea, vomiting, or diarrhea. Overall I feel like the infusion therapy has been very beneficial for him. Objective Constitutional Well-nourished and well-hydrated in no acute distress. Vitals Time Taken: 9:15 AM, Height: 71 in, Weight: 338 lbs, BMI: 47.1, Temperature: 98.2 F, Pulse: 72 bpm, Respiratory Rate: 18 breaths/min, Blood Pressure: 121/69 mmHg. Respiratory normal breathing without difficulty. Psychiatric this patient is able to make decisions and demonstrates good insight into disease process. Alert and Oriented x 3. pleasant and cooperative. General Notes: Upon inspection patient's wound bed actually showed signs of good granulation at this time. There does not appear  to be any evidence of active infection overall very pleased with the epithelial growth around the edges as well and the wound does not appear to be infected although he does have some signs of irritation from the surrounding irritation where his bandage is typically. Integumentary (Hair, Skin) Wound #1 status is Open. Original cause of wound was Gradually Appeared. The wound is located on the Left,Lateral Lower Leg. The wound measures 5.9cm length x 3.3cm width x 0.3cm depth; 15.292cm^2 area and 4.588cm^3 volume. There is muscle and Fat Layer (Subcutaneous Tissue) Exposed exposed. There is a large amount of purulent drainage noted. The wound margin is epibole. There is large (67-100%) pink granulation within the wound bed. There is a small (1-33%) amount of necrotic tissue within the wound bed including Adherent  Slough. 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 Anesthetic (add to Medication List): Wound #1 Left,Lateral Lower Leg: Topical Lidocaine 4% cream applied to wound bed prior to debridement (In Clinic Only). Primary Wound Dressing: DAJUAN, TURNLEY ([id number]) Gentamicin Sulfate Cream - mix 1:1 with triamcinolone to apply to the wound bed Secondary Dressing: Wound #1 Left,Lateral Lower Leg: Boardered Foam Dressing Dressing Change Frequency: Wound #1 Left,Lateral Lower Leg: Change dressing every day. Follow-up Appointments: Wound #1 Left,Lateral Lower Leg: Return Appointment in 1 week. Edema Control: Wound #1 Left,Lateral Lower Leg: Patient to wear own compression stockings Elevate legs to the level of the heart and pump ankles as often as possible 1. I would recommend currently that we continue with the wound care measures as before he has been utilizing alternating gentamicin and steroid cream in order to help keep the inflammation under control and infection under control everything seems to be doing well in that regard. 2. I am also can recommend that the patient continue obviously with seeing his dermatology specialist for infusion therapy that seems to be doing excellent for him as well he is due for another infusion coming up shortly. 3. He is to continue to wear his compression sock this is beneficial as keeping the swelling under control also feel like the muscle has retracted somewhat at is it started to be less inflamed and healing more effectively. We will see patient back for reevaluation in 2 weeks here in the clinic. If anything worsens or changes patient will contact  our office for additional recommendations. Electronic Signature(s) Signed: 12/13/2019 9:52:15 AM By: Worthy Keeler PA-C Entered By: Worthy Keeler on 12/13/2019 09:52:14 AASHIR, UMHOLTZ ([id number]) -------------------------------------------------------------------------------- SuperBill Details Patient Name: [name] Date of Service: 12/13/2019 Medical Record Number: [id number] Patient Account Number: [id number] Date of Birth/Sex: 05-19-1981 (39 y.o. M) Treating RN: [name] Primary Care Provider: [name] Other Clinician: Referring Provider: [name] Treating Provider/Extender: Melburn Hake, Jaymen Fetch Weeks in Treatment: 157 Diagnosis Coding ICD-10 Codes Code Description [phone] Non-pressure chronic ulcer of left calf with fat layer exposed L88 Pyoderma gangrenosum L97.321 Non-pressure chronic ulcer of left ankle limited to breakdown of skin I87.2 Venous insufficiency (chronic) (peripheral) L03.116 Cellulitis of left lower limb Facility Procedures CPT4 Code: [id number] Description: 99213 - WOUND CARE VISIT-LEV 3 EST PT Modifier: Quantity: 1 Physician Procedures CPT4 Code: [id number] Description: 71245 - WC PHYS LEVEL 3 - EST PT Modifier: Quantity: 1 CPT4 Code: Description: ICD-10 Diagnosis Description L97.222 Non-pressure  chronic ulcer of left calf with fat layer exposed L88 Pyoderma gangrenosum L97.321 Non-pressure chronic ulcer of left ankle limited to breakdown of skin I87.2 Venous insufficiency (chronic)  (peripheral) Modifier: Quantity: Electronic Signature(s) Signed: 12/13/2019 9:52:50 AM By: Worthy Keeler PA-C Entered By: Worthy Keeler on 12/13/2019 09:52:49

## 2019-12-14 NOTE — Progress Notes (Signed)
[name] ([id number]) Visit Report for 12/13/2019 Arrival Information Details Patient Name: [name] Date of Service: 12/13/2019 9:00 AM Medical Record Number: [id number] Patient Account Number: [id number] Date of Birth/Sex: Aug 22, 1980 (39 y.o. M) Treating RN: [name] Primary Care Zaydah Nawabi: [name] Other Clinician: Referring Santiago Stenzel: [name] Treating Zakai Gonyea/Extender: Melburn Hake, [name]: 51 Visit Information History Since Last Visit All ordered tests and consults were completed: No Patient Arrived: Ambulatory Added or deleted any medications: No Arrival Time: 09:04 Any new allergies or adverse reactions: No Accompanied By: self Had a fall or experienced change in No Transfer Assistance: None activities of daily living that may affect Patient Identification Verified: Yes risk of falls: Secondary Verification Process Completed: Yes Signs or symptoms of abuse/neglect since last visito No Patient Requires Transmission-Based No Hospitalized since last visit: No Precautions: Implantable device outside of the clinic excluding No Patient Has Alerts: Yes cellular tissue based products placed in the center Patient Alerts: 08/09/19 Picc Line since last visit: Right Has Dressing in Place as Prescribed: Yes Has Compression in Place as Prescribed: Yes Pain Present Now: No Electronic Signature(s) Signed: 12/14/2019 9:37:01 AM By: Darci Needle Entered By: Darci Needle on 12/13/2019 09:09:52 [name] ([id number]) -------------------------------------------------------------------------------- Clinic Level of Care Assessment Details Patient Name: [name] Date of Service: 12/13/2019 9:00 AM Medical Record Number: [id number] Patient Account Number: [id number] Date of Birth/Sex: 17-Jan-1981 (39 y.o. M) Treating RN: [name] Primary Care Khailee Mick: [name] Other Clinician: Referring Jenefer Woerner: [name] Treating  Luevenia Mcavoy/Extender: Melburn Hake, [name]: [facility] Level of Care Assessment Items TOOL 4 Quantity Score []  - Use when only an EandM is performed on FOLLOW-UP visit 0 ASSESSMENTS - Nursing Assessment / Reassessment X - Reassessment of Co-morbidities (includes updates in patient status) 1 10 X- 1 5 Reassessment of Adherence to Treatment Plan ASSESSMENTS - Wound and Skin Assessment / Reassessment X - Simple Wound Assessment / Reassessment - one wound 1 5 []  - 0 Complex Wound Assessment / Reassessment - multiple wounds []  - 0 Dermatologic / Skin Assessment (not related to wound area) ASSESSMENTS - Focused Assessment []  - Circumferential Edema Measurements - multi extremities 0 []  - 0 Nutritional Assessment / Counseling / Intervention []  - 0 Lower Extremity Assessment (monofilament, tuning fork, pulses) []  - 0 Peripheral Arterial Disease Assessment (using hand held doppler) ASSESSMENTS - Ostomy and/or Continence Assessment and Care []  - Incontinence Assessment and Management 0 []  - 0 Ostomy Care Assessment and Management (repouching, etc.) PROCESS - Coordination of Care X - Simple Patient / Family Education for ongoing care 1 15 []  - 0 Complex (extensive) Patient / Family Education for ongoing care X- 1 10 Staff obtains Programmer, systems, Records, Test Results / Process Orders []  - 0 Staff telephones HHA, Nursing Homes / Clarify orders / etc []  - 0 Routine Transfer to another Facility (non-emergent condition) []  - 0 Routine Hospital Admission (non-emergent condition) []  - 0 New Admissions / Biomedical engineer / Ordering NPWT, Apligraf, etc. []  - 0 Emergency Hospital Admission (emergent condition) X- 1 10 Simple Discharge Coordination []  - 0 Complex (extensive) Discharge Coordination PROCESS - Special Needs []  - Pediatric / Minor Patient Management 0 []  - 0 Isolation Patient Management []  - 0 Hearing / Language / Visual special needs []  - 0 Assessment of  Community assistance (transportation, D/C planning, etc.) []  - 0 Additional assistance / Altered mentation []  - 0 Support Surface(s) Assessment (bed, cushion, seat, etc.) INTERVENTIONS - Wound Cleansing / Measurement [name], [name] (  [id number]) X- 1 5 Simple Wound Cleansing - one wound []  - 0 Complex Wound Cleansing - multiple wounds X- 1 5 Wound Imaging (photographs - any number of wounds) []  - 0 Wound Tracing (instead of photographs) X- 1 5 Simple Wound Measurement - one wound []  - 0 Complex Wound Measurement - multiple wounds INTERVENTIONS - Wound Dressings []  - Small Wound Dressing one or multiple wounds 0 X- 1 15 Medium Wound Dressing one or multiple wounds []  - 0 Large Wound Dressing one or multiple wounds []  - 0 Application of Medications - topical []  - 0 Application of Medications - injection INTERVENTIONS - Miscellaneous []  - External ear exam 0 []  - 0 Specimen Collection (cultures, biopsies, blood, body fluids, etc.) []  - 0 Specimen(s) / Culture(s) sent or taken to Lab for analysis []  - 0 Patient Transfer (multiple staff / Civil Service fast streamer / Similar devices) []  - 0 Simple Staple / Suture removal (25 or less) []  - 0 Complex Staple / Suture removal (26 or more) []  - 0 Hypo / Hyperglycemic Management (close monitor of Blood Glucose) []  - 0 Ankle / Brachial Index (ABI) - do not check if billed separately X- 1 5 Vital Signs Has the patient been seen at the hospital within the last three years: Yes Total Score: 90 Level Of Care: New/Established - Level 3 Electronic Signature(s) Signed: 12/13/2019 5:17:32 PM By: [name], [name], RN, [name], [name], [name] Entered By: [name], [name], RN, [name], [name] on 12/13/2019 09:21:15 [name] ([id number]) -------------------------------------------------------------------------------- Encounter Discharge Information Details Patient Name: [name] Date of Service: 12/13/2019 9:00 AM Medical Record Number: [id number] Patient Account  Number: [id number] Date of Birth/Sex: [birth year] (39 y.o. M) Treating RN: [name] Primary Care Elexis Pollak: [name] Other Clinician: Referring Estle Sabella: [name] Treating Burnie Therien/Extender: Melburn Hake, [name]: 157 Encounter Discharge Information Items Discharge Condition: Stable Ambulatory Status: Ambulatory Discharge Destination: Home Transportation: Private Auto Accompanied By: self Schedule Follow-up Appointment: Yes Clinical Summary of Care: Electronic Signature(s) Signed: 12/13/2019 5:17:32 PM By: [name], [name], RN, [name], [name], [name] Entered By: [name], [name], RN, [name], [name] on 12/13/2019 09:22:31 [name] ([id number]) -------------------------------------------------------------------------------- Lower Extremity Assessment Details Patient Name: [name] Date of Service: 12/13/2019 9:00 AM Medical Record Number: [id number] Patient Account Number: [id number] Date of Birth/Sex: 25-Dec-1980 (39 y.o. M) Treating RN: [name] Primary Care Desera Graffeo: [name] Other Clinician: Referring Bibi Economos: [name] Treating Rhen Dossantos/Extender: Worthy Keeler Weeks in Treatment: 157 Vascular Assessment Pulses: Dorsalis Pedis Palpable: [Left:Yes] Electronic Signature(s) Signed: 12/13/2019 5:17:32 PM By: [name], [name], RN, [name], [name], [name] Entered By: [name], [name], RN, [name], [name] on 12/13/2019 09:19:37 [name] ([id number]) -------------------------------------------------------------------------------- Multi Wound Chart Details Patient Name: [name] Date of Service: 12/13/2019 9:00 AM Medical Record Number: [id number] Patient Account Number: [id number] Date of Birth/Sex: Jul 28, 1980 (39 y.o. M) Treating RN: [name] Primary Care Cayla Wiegand: [name] Other Clinician: Referring Demone Lyles: [name] Treating Arthea Nobel/Extender: Melburn Hake, [name]: 157 Vital Signs Height(in): 71 Pulse(bpm): 72 Weight(lbs):  338 Blood Pressure(mmHg): 121/69 Body Mass Index(BMI): 47 Temperature(F): 98.2 Respiratory Rate(breaths/min): 18 Photos: [N/A:N/A] Wound Location: Left, Lateral Lower Leg N/A N/A Wounding Event: Gradually Appeared N/A N/A Primary Etiology: Pyoderma N/A N/A Comorbid History: Sleep Apnea, Hypertension, Colitis N/A N/A Date Acquired: 11/18/2015 N/A N/A Weeks of Treatment: 157 N/A N/A Wound Status: Open N/A N/A Measurements L x W x D (cm) 5.9x3.3x0.3 N/A N/A Area (cm) : 15.292 N/A N/A Volume (cm) : 4.588 N/A N/A % Reduction in Area: -  211.50% N/A N/A % Reduction in Volume: -16.80% N/A N/A Classification: Full Thickness With Exposed N/A N/A Support Structures Exudate Amount: Large N/A N/A Exudate Type: Purulent N/A N/A Exudate Color: yellow, brown, green N/A N/A Wound Margin: Epibole N/A N/A Granulation Amount: Large (67-100%) N/A N/A Granulation Quality: Pink N/A N/A Necrotic Amount: Small (1-33%) N/A N/A Exposed Structures: Fat Layer (Subcutaneous Tissue) N/A N/A Exposed: Yes Muscle: Yes Fascia: No Tendon: No Joint: No Bone: No Epithelialization: None N/A N/A Treatment Notes Electronic Signature(s) Signed: 12/13/2019 5:17:32 PM By: [name], [name], RN, [name], [name], [name] Entered By: [name], [name], RN, [name], [name] on 12/13/2019 09:20:01 [name] ([id number]) -------------------------------------------------------------------------------- Multi-Disciplinary Care Plan Details Patient Name: [name] Date of Service: 12/13/2019 9:00 AM Medical Record Number: [id number] Patient Account Number: [id number] Date of Birth/Sex: [birth year] (39 y.o. M) Treating RN: [name] Primary Care Tanda Morrissey: [name] Other Clinician: Referring Arnaldo Heffron: [name] Treating Lorelie Biermann/Extender: Melburn Hake, [name]: 157 Active Inactive Venous Leg Ulcer Nursing Diagnoses: Knowledge deficit related to disease process and management Potential for venous Insuffiency (use  before diagnosis confirmed) Goals: Non-invasive venous studies are completed as ordered Date Initiated: 12/11/2016 Target Resolution Date: 03/13/2017 Goal Status: Active Patient will maintain optimal edema control Date Initiated: 12/11/2016 Target Resolution Date: 03/13/2017 Goal Status: Active Patient/caregiver will verbalize understanding of disease process and disease management Date Initiated: 12/11/2016 Target Resolution Date: 03/13/2017 Goal Status: Active Verify adequate tissue perfusion prior to therapeutic compression application Date Initiated: 12/11/2016 Target Resolution Date: 03/13/2017 Goal Status: Active Interventions: Assess peripheral edema status every visit. Compression as ordered Provide education on venous insufficiency Treatment Activities: Therapeutic compression applied : 12/11/2016 Notes: Wound/Skin Impairment Nursing Diagnoses: Impaired tissue integrity Knowledge deficit related to ulceration/compromised skin integrity Goals: Patient/caregiver will verbalize understanding of skin care regimen Date Initiated: 12/11/2016 Target Resolution Date: 03/13/2017 Goal Status: Active Ulcer/skin breakdown will have a volume reduction of 30% by week 4 Date Initiated: 12/11/2016 Target Resolution Date: 03/13/2017 Goal Status: Active Ulcer/skin breakdown will have a volume reduction of 50% by week 8 Date Initiated: 12/11/2016 Target Resolution Date: 03/13/2017 Goal Status: Active Ulcer/skin breakdown will have a volume reduction of 80% by week 12 Date Initiated: 12/11/2016 Target Resolution Date: 03/13/2017 Goal Status: Active Ulcer/skin breakdown will heal within 14 weeks Date Initiated: 12/11/2016 Target Resolution Date: 03/13/2017 Goal Status: Active GABRIELLA, GUILE ([id number]) Interventions: Assess patient/caregiver ability to obtain necessary supplies Assess patient/caregiver ability to perform ulcer/skin care regimen upon admission and as needed Assess  ulceration(s) every visit Provide education on ulcer and skin care Treatment Activities: Skin care regimen initiated : 12/11/2016 Topical wound management initiated : 12/11/2016 Notes: Electronic Signature(s) Signed: 12/13/2019 5:17:32 PM By: [name], [name], RN, [name], [name], [name] Entered By: [name], [name], RN, [name], [name] on 12/13/2019 09:19:49 [name] ([id number]) -------------------------------------------------------------------------------- Pain Assessment Details Patient Name: [name] Date of Service: 12/13/2019 9:00 AM Medical Record Number: [id number] Patient Account Number: [id number] Date of Birth/Sex: [birth year] (39 y.o. M) Treating RN: [name] Primary Care Mya Suell: [name] Other Clinician: Referring Rocco Kerkhoff: [name] Treating Yasmene Salomone/Extender: Melburn Hake, [name]: 157 Active Problems Location of Pain Severity and Description of Pain Patient Has Paino No Site Locations With Dressing Change: No Pain Management and Medication Current Pain Management: Electronic Signature(s) Signed: 12/13/2019 5:17:32 PM By: [name], [name], RN, [name], [name], [name] Signed: 12/14/2019 9:37:01 AM By: Darci Needle Entered By: Darci Needle on 12/13/2019 [name] ([id number]) -------------------------------------------------------------------------------- Patient/Caregiver Education Details Patient Name: [name] Date of  Service: 12/13/2019 9:00 AM Medical Record Number: [id number] Patient Account Number: [id number] Date of Birth/Gender: 15-Nov-1980 (39 y.o. M) Treating RN: [name] Primary Care Physician: [name] Other Clinician: Referring Physician: [name] Treating Physician/Extender: Sharalyn Ink in Treatment: 157 Education Assessment Education Provided To: Patient Education Topics Provided Venous: Handouts: Controlling Swelling with Compression Stockings Methods: Demonstration, Explain/Verbal Responses:  State content correctly Wound/Skin Impairment: Handouts: Caring for Your Ulcer Methods: Demonstration, Explain/Verbal Responses: State content correctly Electronic Signature(s) Signed: 12/13/2019 5:17:32 PM By: [name], [name], RN, [name], [name], [name] Entered By: [name], [name], RN, [name], [name] on 12/13/2019 09:21:45 [name] ([id number]) -------------------------------------------------------------------------------- Wound Assessment Details Patient Name: [name] Date of Service: 12/13/2019 9:00 AM Medical Record Number: [id number] Patient Account Number: [id number] Date of Birth/Sex: 1981/05/11 (39 y.o. M) Treating RN: [name] Primary Care Kendahl Bumgardner: [name] Other Clinician: Referring Rifka Ramey: [name] Treating Maryama Kuriakose/Extender: Melburn Hake, [name]: 157 Wound Status Wound Number: 1 Primary Etiology: Pyoderma Wound Location: Left, Lateral Lower Leg Wound Status: Open Wounding Event: Gradually Appeared Comorbid History: Sleep Apnea, Hypertension, Colitis Date Acquired: 11/18/2015 Weeks Of Treatment: 157 Clustered Wound: No Photos Wound Measurements Length: (cm) 5.9 Width: (cm) 3.3 Depth: (cm) 0.3 Area: (cm) 15.292 Volume: (cm) 4.588 % Reduction in Area: -211.5% % Reduction in Volume: -16.8% Epithelialization: None Wound Description Classification: Full Thickness With Exposed Support Structures Wound Margin: Epibole Exudate Amount: Large Exudate Type: Purulent Exudate Color: yellow, brown, green Foul Odor After Cleansing: No Slough/Fibrino Yes Wound Bed Granulation Amount: Large (67-100%) Exposed Structure Granulation Quality: Pink Fascia Exposed: No Necrotic Amount: Small (1-33%) Fat Layer (Subcutaneous Tissue) Exposed: Yes Necrotic Quality: Adherent Slough Tendon Exposed: No Muscle Exposed: Yes Necrosis of Muscle: No Joint Exposed: No Bone Exposed: No Treatment Notes Wound #1 (Left, Lateral Lower Leg) Notes gent oinTment on  lateral leg TCA on medial with BFD [name], [name] ([id number]) Electronic Signature(s) Signed: 12/13/2019 5:17:32 PM By: [name], [name], RN, [name], [name], [name] Signed: 12/14/2019 9:37:01 AM By: Darci Needle Entered By: Darci Needle on 12/13/2019 09:14:52 [name], [name] ([id number]) -------------------------------------------------------------------------------- Vitals Details Patient Name: [name] Date of Service: 12/13/2019 9:00 AM Medical Record Number: [id number] Patient Account Number: [id number] Date of Birth/Sex: August 22, 1980 (39 y.o. M) Treating RN: [name] Primary Care Drew Lips: [name] Other Clinician: Referring Jakhia Buxton: [name] Treating Jessalyn Hinojosa/Extender: Melburn Hake, [name]: 157 Vital Signs Time Taken: 09:15 Temperature (F): 98.2 Height (in): 71 Pulse (bpm): 72 Weight (lbs): 338 Respiratory Rate (breaths/min): 18 Body Mass Index (BMI): 47.1 Blood Pressure (mmHg): 121/69 Reference Range: 80 - 120 mg / dl Electronic Signature(s) Signed: 12/14/2019 9:37:01 AM By: Darci Needle Entered By: Darci Needle on 12/13/2019 09:10:40

## 2019-12-16 ENCOUNTER — Other Ambulatory Visit: Payer: Self-pay | Admitting: Primary Care

## 2019-12-16 DIAGNOSIS — F3342 Major depressive disorder, recurrent, in full remission: Secondary | ICD-10-CM

## 2019-12-20 ENCOUNTER — Other Ambulatory Visit: Payer: Self-pay | Admitting: Primary Care

## 2019-12-20 DIAGNOSIS — E119 Type 2 diabetes mellitus without complications: Secondary | ICD-10-CM

## 2019-12-20 MED ORDER — LANTUS SOLOSTAR 100 UNIT/ML ~~LOC~~ SOPN: 25.0000 [IU] | PEN_INJECTOR | Freq: Every day | SUBCUTANEOUS | 0 refills | Status: DC

## 2019-12-20 NOTE — Addendum Note (Signed)
Addended by: Jacqualin Combes on: 12/20/2019 04:26 PM   Modules accepted: Orders

## 2019-12-27 ENCOUNTER — Ambulatory Visit: Payer: 59 | Admitting: Physician Assistant

## 2019-12-28 ENCOUNTER — Other Ambulatory Visit: Payer: Self-pay

## 2019-12-28 ENCOUNTER — Other Ambulatory Visit: Payer: Self-pay | Admitting: Primary Care

## 2019-12-28 DIAGNOSIS — E119 Type 2 diabetes mellitus without complications: Secondary | ICD-10-CM

## 2020-01-06 ENCOUNTER — Encounter: Payer: 59 | Attending: Physician Assistant | Admitting: Physician Assistant

## 2020-01-06 ENCOUNTER — Other Ambulatory Visit: Payer: Self-pay

## 2020-01-06 DIAGNOSIS — L97321 Non-pressure chronic ulcer of left ankle limited to breakdown of skin: Secondary | ICD-10-CM | POA: Insufficient documentation

## 2020-01-06 DIAGNOSIS — G4730 Sleep apnea, unspecified: Secondary | ICD-10-CM | POA: Diagnosis not present

## 2020-01-06 DIAGNOSIS — X58XXXA Exposure to other specified factors, initial encounter: Secondary | ICD-10-CM | POA: Diagnosis not present

## 2020-01-06 DIAGNOSIS — T781XXA Other adverse food reactions, not elsewhere classified, initial encounter: Secondary | ICD-10-CM | POA: Diagnosis not present

## 2020-01-06 DIAGNOSIS — I10 Essential (primary) hypertension: Secondary | ICD-10-CM | POA: Diagnosis not present

## 2020-01-06 DIAGNOSIS — L280 Lichen simplex chronicus: Secondary | ICD-10-CM | POA: Insufficient documentation

## 2020-01-06 DIAGNOSIS — L03116 Cellulitis of left lower limb: Secondary | ICD-10-CM | POA: Diagnosis not present

## 2020-01-06 DIAGNOSIS — K5190 Ulcerative colitis, unspecified, without complications: Secondary | ICD-10-CM | POA: Insufficient documentation

## 2020-01-06 DIAGNOSIS — L88 Pyoderma gangrenosum: Secondary | ICD-10-CM | POA: Diagnosis not present

## 2020-01-06 DIAGNOSIS — L97222 Non-pressure chronic ulcer of left calf with fat layer exposed: Secondary | ICD-10-CM | POA: Diagnosis present

## 2020-01-06 DIAGNOSIS — I872 Venous insufficiency (chronic) (peripheral): Secondary | ICD-10-CM | POA: Insufficient documentation

## 2020-01-06 NOTE — Unmapped (Signed)
[facility] Note scanned into note for review

## 2020-01-06 NOTE — Progress Notes (Addendum)
[name], [name] ([id number]) Visit Report for 01/06/2020 Chief Complaint Document Details Patient Name: [name], [name] Date of Service: 01/06/2020 8:00 AM Medical Record Number: [id number] Patient Account Number: [id number] Date of Birth/Sex: Nov 02, 1980 (39 y.o. 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 M) Treating RN: Army Melia Primary Care Provider: [name] Other Clinician: Referring Provider: [name] Treating Provider/Extender: Melburn Hake, Afnan Emberton Weeks in Treatment: 161 History of Present Illness HPI Description: 12/04/16; 39 year old man who comes into the clinic today for review of a wound on the posterior left calf. He tells me that is been there for about a year. He is not a diabetic he does smoke half a pack per day. He was seen in the ER on 11/20/16 felt to have cellulitis around the wound and was given clindamycin. An x-ray did not show osteomyelitis. The patient initially tells me that he has a milk allergy that sets off a pruritic itching rash on his lower legs which she scratches incessantly and he thinks that's what may have set up the wound. He has been using various topical antibiotics and ointments without any effect. He works in a [employer] and is on his feet all day. He does not have a  prior history of wounds however he does have the rash on both lower legs the right arm and the ventral aspect of his left arm. These are excoriations and clearly have had scratching however there are of macular looking areas on both legs including a substantial larger area on the right leg. This does not have an underlying open area. There is no blistering. The patient tells me that 2 years ago in [place] in response to the rash on his legs he saw a dermatologist who told him he had a condition which may be pyoderma gangrenosum although I may be putting words into his mouth. He seemed to recognize this. 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 He is not in any treatment for this. He's had no recent travel 12/11/16; the patient arrives today with his wound and roughly the same condition we've been using silver alginate this is a deep punched out wound with some surrounding erythema but no tenderness. Biopsy I did did not show confirmed pyoderma gangrenosum suggested nonspecific inflammation and vasculitis but does not provide an actual description of what was seen by the pathologist. I'm really not able to understand this We have also received information from the patient's dermatologist in [place] notes from April 2016. This was a doctor Agarwal-antal. 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 He was prescribed topical steroid high potency under occlusion which helped but at this point the patient did not have a deep punched out wound. 12/18/16; the patient's wound is larger in terms of surface area however this surface looks better and there is less depth. The surrounding erythema also is better. The patient states that the wrap we put on came off 2 days ago when he has been using his  compression stockings. He we are in the process of getting a dermatology consult. 12/26/16 on evaluation today patient's left lower extremity wound shows evidence of  infection with surrounding erythema noted. He has been tolerating the dressing changes but states that he has noted more discomfort. There is a larger area of erythema surrounding the wound. No fevers, chills, nausea, or vomiting noted at this time. With that being said the wound still does have slough covering the surface. He is not allergic to any medication that he is aware of at this point. In regard to his right lower extremity he had several regions that are erythematous and pruritic he wonders if there's anything we can do to help that. 01/02/17 I reviewed patient's wound culture which was obtained his visit last week. He was placed on doxycycline at that point. Unfortunately that does not appear to be an antibiotic that would likely help with the situation however the pseudomonas noted on culture is sensitive to Cipro. Also unfortunately patient's wound seems to have a large compared to last week's evaluation. Not severely so but there are definitely increased measurements in general. He is continuing to have discomfort as well he writes this to be a seven out of 10. In fact he would prefer me not to perform any debridement today due to the fact that he is having discomfort and considering he has an active infection on the little reluctant to do so anyway. No fevers, chills, nausea, or vomiting noted at this time. 01/08/17; patient seems dermatology on September 5. I suspect dermatology will want the slides from the biopsy I did sent to their pathologist. I'm not sure if there is a way we can expedite that. In any case the culture I did before I left on vacation 3 weeks ago showed Pseudomonas he was given 10 days of Cipro and per her description of her intake nurses is actually somewhat better this week although the wound is quite a bit bigger than I remember the last time I saw this. He still has 3 more days of Cipro 01/21/17; dermatology appointment tomorrow. He has completed the ciprofloxacin  for Pseudomonas. Surface of the wound looks better however he is had some deterioration in the lesions on his right leg. Meantime the left lateral leg wound we will continue with sample 01/29/17; patient had his dermatology appointment but I can't yet see that note. He is completed his antibiotics. The wound is more superficial but considerably larger in circumferential area than when he came in. This is in his left lateral calf. He also has swollen erythematous areas with superficial wounds on the right leg and small papular areas on both arms. There apparently areas in her his upper thighs and buttocks I did not look at those. Dermatology biopsied the right leg. Hopefully will have their input next week. 02/05/17; patient went back to see his dermatologist who told him that he had a "scratching problem" as well as staph. He is now on a 30 day course of doxycycline and I believe she gave him triamcinolone cream to the right leg areas to help with the itching [not exactly sure but probably triamcinolone]. She apparently looked at the left lateral leg wound although this was not rebiopsied and I think felt to be ultimately part of the same pathogenesis. He is using sample border foam and changing nevus himself. He now has a new open area on the right posterior leg which was his biopsy site I don't have any of the  dermatology notes 02/12/17; we put the patient in compression last week with SANTYL to the wound on the left leg and the biopsy. Edema is much better and the depth of the wound is now at level of skin. Area is still the same oBiopsy site on the right lateral leg we've also been using santyl with a border foam dressing and he is changing this himself. 02/19/17; Using silver alginate started last week to both the substantial left leg wound and the biopsy site on the right wound. He is tolerating compression well. Has a an appointment with his primary M.D. tomorrow wondering about diuretics although  I'm wondering if the edema problem is actually lymphedema 02/26/17; the patient has been to see his primary doctor Dr. Jerrel Ivory at [facility]. She started him on Lasix 20 mg and this seems to have helped with the edema. However we are not making substantial change with the left lateral calf wound and inflammation. The biopsy site on the right leg also looks stable but not really all that different. 03/12/17; the patient has been to see vein and vascular Dr. Lucky Cowboy. He has had venous reflux studies I have not reviewed these. I did get a call from his dermatology office. They felt that he might have pathergy based on their biopsy on his right leg which led them to look at the slides of ESTER, MABE ([id number]) the biopsy I did on the left leg and they wonder whether this represents pyoderma gangrenosum which was the original supposition in a man with ulcerative colitis albeit inactive for many years. They therefore recommended clobetasol and tetracycline i.e. 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 03/26/17; apparently the patient just had reflux studies not an appointment with Dr. dew. She arrives in clinic today having applied clobetasol for 2-3 weeks. He notes over the last 2-3 days excessive drainage having to change the dressing 3-4 times a day and also expanding erythema. 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 Lillia Carmel at [facility] dermatology locally who kindly saw him at my request. 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 My concern was the degree of erythema medially extending from his left leg wound which was either inflammation from pyoderma  or cellulitis. I put him on Augmentin however culture of the wound showed Pseudomonas which is quinolone sensitive. I really don't believe he has cellulitis however in view of everything I will continue and give him a course of Cipro. He is also on doxycycline as an immune modulator for the pyoderma. In addition to his original wound on the left lateral leg with surrounding erythema he has a wound on the right posterior calf which was an original biopsy site done by dermatology. 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 Saw Dr. Lillia Carmel yesterday. He has been using topical antibiotics to both wound areas his original wound on the left and the biopsies/pathergy area on the right. There is definitely some improvement in the inflammation around the wound on the right although the patient states he has increasing sensitivity of the wounds. He is on prednisone 60 and doxycycline 1 as prescribed by Dr. Lillia Carmel. He is covering the topical antibiotic  with gauze and putting this in his own compression stocks and changing this daily. He states that Dr. Lottie Rater did a culture of the left leg wound yesterday 05/07/17; pyoderma gangrenosum. The patient saw Dr. Lillia Carmel yesterday and has a follow-up with her in one month. He is still using topical antibiotics to both wounds although he can't recall exactly what type. He is still on prednisone 60 mg. Dr. Lillia Carmel stated that the doxycycline could stop if we were in agreement. 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 The right medial leg was induced by biopsy/pathergy. The area on the right is essentially healed. 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 The right medial leg has closed and remains closed. He  is still on prednisone 60. oHe tells me he missed his last dermatology appointment with Dr. Lillia Carmel but will make another appointment. He reports that her blood sugar at a recent screen in [place] was high 200's. He was 180 today. 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 I think he is going to require still much longer prednisone perhaps another 3 months before attempting to taper. In the meantime his wound is a lot better. Smaller. He is cleaning this off daily and applying topical antibiotics. When he was last in the clinic I thought about changing to [facility] and actually put in a couple of calls to dermatology although probably not during their business hours. 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 He continues on oral prednisone. He has been using triple antibiotic ointment. There is surface debris and we will transition to [name] and have him return in 2 weeks. He has lost 30 pounds since his last appointment with lifestyle modification. He may benefit from topical steroid cream for treatment this can be considered at a later date. 08/22/17 on evaluation today patient appears to actually be doing rather well in regard to his left lateral lower extremity ulcer. He has actually been managed by Dr. Dellia Nims most recently. Patient is currently on oral steroids at this time. This seems to have been of benefit for him. Nonetheless his last visit was actually with Leah on 08/06/17. Currently he is not utilizing any topical steroid creams although this could be of benefit as well. No fevers, chills, nausea, or vomiting noted at this time. 09/05/17 on evaluation today patient appears to be doing better in regard to his left lateral lower extremity ulcer. He has been tolerating the dressing changes without complication. He is using Santyl with good effect. Overall I'm  very pleased with how things are standing at this point. Patient likewise is happy that this is doing better. 09/19/17 on evaluation today patient actually appears to be doing rather well in regard to his left lateral lower extremity ulcer. 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 He's not having any significant pain. 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 He has been tolerating the Santyl without complication and in general I feel like he's making good progress. 10/17/17 on evaluation  today patient appears to be doing very well in regard to his left lateral lower surety ulcer. He has been tolerating the dressing changes without complication. There does not appear to be any evidence of infection he's alternating the Santyl and the triple antibiotic ointment every other day this seems to be doing well for him. 11/03/17 on evaluation today patient appears to be doing very well in regard to his left lateral lower extremity ulcer. He is been tolerating the dressing changes without complication which is good news. Fortunately there does not appear to be any evidence of infection which is also great news. Overall is doing excellent they are starting to taper down on the prednisone is down to 40 mg at this point it also started topical clobetasol for him. 11/17/17 on evaluation today patient appears to be doing well in regard to his left lateral lower surety ulcer. He's been tolerating the dressing changes without complication. He does note that he is having no pain, no excessive drainage or discharge, and overall he feels like things are going about how he would expect and hope they would. Overall he seems to have no evidence of infection at this time in my opinion which is good news. 12/04/17-He is seen in follow-up  evaluation for right lateral lower extremity ulcer. He has been applying topical steroid cream. Today's measurement show slight increase in size. Over the next 2 weeks we will transition to every other day Santyl and steroid cream. He has been encouraged to monitor for changes and notify clinic with any concerns 12/15/17 on evaluation today patient's left lateral motion the ulcer and fortunately is doing worse again at this point. This just since last week to this week has close to doubled in size according to the patient. I did not seeing last week's I do not have a visual to compare this to in our system was also down so we do not have all the charts and at this point. Nonetheless it does have me somewhat concerned in regard to the fact that again he was worried enough about it he has contact the dermatology that placed them back on the full strength, 50 mg a day of the prednisone that he was taken previous. He continues to alternate using clobetasol along with Santyl at this point. He is obviously somewhat frustrated. 12/22/17 on evaluation today patient appears to be doing a little worse compared to last evaluation. Unfortunately the wound is a little deeper and slightly larger than the last week's evaluation. With that being said he has made some progress in regard to the irritation surrounding at this time unfortunately despite that progress that's been made he still has a significant issue going on here. 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 [name], [name] ([id number]) For that reason I discussed with him today the possibility of perform a wound culture to ensure there's nothing overtly infected. 01/06/18 on evaluation today patient's wound is larger and deeper than previously evaluated. With that being said it did appear that his wound was  infected after my last evaluation with him. Subsequently I did end up prescribing a prescription for Bactrim DS which she has been taking and having no complication with. Fortunately there does not appear to be any evidence of infection at this point in time as far as anything spreading, no want to touch, and overall I  feel like things are showing signs of improvement. 01/13/18 on evaluation today patient appears to be even a little larger and deeper than last time. There still muscle exposed in the base of the wound. Nonetheless he does appear to be less erythematous I do believe inflammation is calming down also believe the infection looks like it's probably resolved at this time based on what I'm seeing. No fevers, chills, nausea, or vomiting noted at this time. 01/30/18 on evaluation today patient actually appears to visually look better for the most part. Unfortunately those visually this looks better he does seem to potentially have what may be an abscess in the muscle that has been noted in the central portion of the wound. This is the first time that I have noted what appears to be fluctuance in the central portion of the muscle. With that being said I'm somewhat more concerned about the fact that this might indicate an abscess formation at this location. I do believe that an ultrasound would be appropriate. This is likely something we need to try to do as soon as possible. He has been switch to mupirocin ointment and he is no longer using the steroid ointment as prescribed by dermatology he sees them again next week he's been decreased from 60 to 40 mg of prednisone. 03/09/18 on evaluation today patient actually appears to be doing a little better compared to last time I saw him. There's not as much erythema surrounding the wound itself. He I did review his most recent infectious disease note which was dated 02/24/18. He saw Dr. Michel Bickers in [place]. 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 Patient is now off of antibiotics and they are continually observing this. There seems to be no change in the past two weeks in my pinion based on what the patient says and what I see today compared to what Dr. Megan Salon likely saw two weeks ago. No fevers, chills, nausea, or vomiting noted at this time. 03/23/18 on evaluation today patient's wound actually appears to be showing signs of improvement which is good news. He is currently still on the Dapsone. He is also working on tapering the prednisone to get off of this and Dr. Lottie Rater is working with him in this regard. Nonetheless overall I feel like the wound is doing well it does appear based on the infectious disease note that I reviewed from Dr. 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 I did also review the results of his ultrasound of left lower extremity which revealed there was a dentist tissue in the base of the wound without an abscess noted. 04/06/18 on evaluation today the patient's left lateral lower extremity ulcer actually appears to be doing fairly well which is excellent news. There does not appear to be any evidence of infection at this time which is also great news. Overall he still does have a significantly large ulceration although little by little he seems to be making progress. He is down to 10 mg a day of the prednisone. 04/20/18 on evaluation today patient actually appears to be doing excellent at this time in regard to his left lower extremity ulcer. He's making signs of good progress unfortunately this is taking much longer than we would really like to see but nonetheless  he is making progress. Fortunately there does not appear to be any evidence of infection at this time. No fevers, chills, nausea, or vomiting noted at  this time. The patient has not been using the Santyl due to the cost he hadn't got in this field yet. He's mainly been using the antibiotic ointment topically. Subsequently he also tells me that he really has not been scrubbing in the shower I think this would be helpful again as I told him it doesn't have to be anything too aggressive to even make it believe just enough to keep it free of some of the loose slough and biofilm on the wound surface. 05/11/18 on evaluation today patient's wound appears to be making slow but sure progress in regard to the left lateral lower extremity ulcer. He is been tolerating the dressing changes without complication. Fortunately there does not appear to be any evidence of infection at this time. He is still just using triple antibiotic ointment along with clobetasol occasionally over the area. He never got the Santyl and really does not seem to intend to in my pinion. 06/01/18 on evaluation today patient appears to be doing a little better in regard to his left lateral lower extremity ulcer. He states that overall he does not feel like he is doing as well with the Dapsone as he did with the prednisone. Nonetheless he sees his dermatologist later today and is gonna talk to them about the possibility of going back on the prednisone. Overall again I believe that the wound would be better if you would utilize Santyl but he really does not seem to be interested in going back to the [place] at this point. He has been using triple antibiotic ointment. 06/15/18 on evaluation today patient's wound actually appears to be doing about the same at this point. Fortunately there is no signs of infection at this time. He has made slight improvements although he continues to not really want to clean the wound bed at this point. He states that he just doesn't mess with it he doesn't want to cause any problems with everything else he has going on. He has been on medication, antibiotics  as prescribed by his dermatologist, for a staff infection of his lower extremities which is really drying out now and looking much better he tells me. Fortunately there is no sign of overall infection. 06/29/18 on evaluation today patient appears to be doing well in regard to his left lateral lower surety ulcer all things considering. Fortunately his staff infection seems to be greatly improved compared to previous. He has no signs of infection and this is drying up quite nicely. He is still the doxycycline for this is no longer on cental, Dapsone, or any of the other medications. His dermatologist has recommended possibility of an infusion but right now he does not want to proceed with that. 07/13/18 on evaluation today patient appears to be doing about the same in regard to his left lateral lower surety ulcer. Fortunately there's no signs of infection at this time which is great news. Unfortunately he still builds up a significant amount of Slough/biofilm of the surface of the wound he still is not really cleaning this as he should be appropriately. Again I'm able to easily with saline and gauze remove the majority of this on the surface which if you would do this at home would likely be a dramatic improvement for him as far as getting the area to improve. Nonetheless overall I still feel  like he is making progress is just very slow. I think Santyl will be of benefit for him as well. Still he has not gotten this as of this point. 07/27/18 on evaluation today patient actually appears to be doing little worse in regards of the erythema around the periwound region of the wound he also tells me that he's been having more drainage currently compared to what he was experiencing last time I saw him. He states not quite as bad as what he had because this was infected previously but nonetheless is still appears to be doing poorly. Fortunately there is no evidence of systemic infection at this point. The patient  tells me that he is not going to be able to afford the Santyl. He is still waiting to hear about the infusion therapy with his dermatologist. Apparently she wants an updated colonoscopy first. 08/10/18 on evaluation today patient appears to be doing better in regard to his left lateral lower extremity ulcer. Fortunately he is showing signs of improvement in this regard he's actually been approved for Remicade infusion's as well although this has not been scheduled as of yet. Fortunately there's no signs of active infection at this time in regard to the wound although he is having some issues with infection of the right lower extremity is been seen as dermatologist for this. Fortunately they are definitely still working with him trying to keep things under control. [name], [name] ([id number]) 09/07/18 on evaluation today patient is actually doing rather well in regard to his left lateral lower extremity ulcer. He notes these actually having some hair grow back on his extremity which is something he has not seen in years. He also tells me that the pain is really not giving them any trouble at this time which is also good news overall she is very pleased with the progress he's using a combination of the mupirocin along with the probate is all mixed. 09/21/18 on evaluation today patient actually appears to be doing fairly well all things considered in regard to his looks from the ulcer. He's been tolerating the dressing changes without complication. Fortunately there's no signs of active infection at this time which is good news he is still on all antibiotics or prevention of the staff infection. He has been on prednisone for time although he states it is gonna contact his dermatologist and see if she put them on a short course due to some irritation that he has going on currently. Fortunately there's no evidence of any overall worsening this is going very slow I think cental would be something that would be  helpful for him although he states that $50 for tube is quite expensive. He therefore is not willing to get that at this point. 10/06/18 on evaluation today patient actually appears to be doing decently well in regard to his left lateral leg ulcer. He's been tolerating the dressing changes without complication. Fortunately there's no signs of active infection at this time. Overall I'm actually rather pleased with the progress he's making although it's slow he doesn't show any signs of infection and he does seem to be making some improvement. I do believe that he may need a switch up and dressings to try to help this to heal more appropriately and quickly. 10/19/18 on evaluation today patient actually appears to be doing better in regard to his left lateral lower extremity ulcer. This is shown signs of having much less Slough buildup at this point due to the fact he has  been using the Santyl. Obviously this is very good news. The overall size of the wound is not dramatically smaller but again the appearance is. 11/02/18 on evaluation today patient actually appears to be doing quite well in regard to his lower [place] ulcer. A lot of the skin around the ulcer is actually somewhat irritating at this point this seems to be more due to the dressing causing irritation from the adhesive that anything else. Fortunately there is no signs of active infection at this time. 11/24/18 on evaluation today patient appears to be doing a little worse in regard to his overall appearance of his lower extremity ulcer. There's more erythema and warmth around the wound unfortunately. He is currently on doxycycline which he has been on for some time. With that being said I'm not sure that seems to be helping with what appears to possibly be an acute cellulitis with regard to his left lower extremity ulcer. No fevers, chills, nausea, or vomiting noted at this time. 12/08/18 on evaluation today patient's wounds actually appears to  be doing significantly better compared to his last evaluation. He has been using Santyl along with alternating tripling about appointment as well as the steroid cream seems to be doing quite well and the wound is showing signs of improvement which is excellent news. Fortunately there's no evidence of infection and in fact his culture came back negative with only normal skin flora noted. 12/21/2018 upon evaluation today patient actually appears to be doing excellent with regard to his ulcer. This is actually the best that I have seen it since have been helping to take care of him. It is both smaller as well as less slough noted on the surface of the wound and seems to be showing signs of good improvement with new skin growing from the edges. He has been using just the triamcinolone he does wonder if he can get a refill of that ointment today. 01/04/2019 upon evaluation today patient actually appears to be doing well with regard to his left lateral lower extremity ulcer. With that being said it does not appear to be that he is doing quite as well as last time as far as progression is concerned. There does not appear to be any signs of infection or significant irritation which is good news. With that being said I do believe that he may benefit from switching to a collagen based dressing based on how clean The wound appears. 01/18/2019 on evaluation today patient actually appears to be doing well with regard to his wound on the left lower extremity. He is not made a lot of progress compared to where we were previous but nonetheless does seem to be doing okay at this time which is good news. There is no signs of active infection which is also good news. My only concern currently is I do wish we can get him into utilizing the collagen dressing his insurance would not pay for the supplies that we ordered although it appears that he may be able to order this through his supply company that he typically utilizes.  This is [employer]. Nonetheless he did try to order it during the office visit today and it appears this did go through. We will see if he can get that it is a different brand but nonetheless he has collagen and I do think will be beneficial. 02/01/2019 on evaluation today patient actually appears to be doing a little worse today in regard to the overall size of his wounds.  Fortunately there is no signs of active infection at this time. That is visually. Nonetheless when this is happened before it was due to infection. For that reason were somewhat concerned about that this time as well. 02/08/2019 on evaluation today patient unfortunately appears to be doing slightly worse with regard to his wound upon evaluation today. Is measuring a little deeper and a little larger unfortunately. I am not really sure exactly what is causing this to enlarge he actually did see his dermatologist she is going to see about initiating Humira for him. Subsequently she also did do steroid injections into the wound itself in the periphery. Nonetheless still nonetheless he seems to be getting a little bit larger he is gone back to just using the steroid cream topically which I think is appropriate. I would say hold off on the collagen for the time being is definitely a good thing to do. Based on the culture results which we finally did get the final result back regarding it shows staph as the bacteria noted again that can be a normal skin bacteria based on the fact however he is having increased drainage and worsening of the wound measurement wise I would go ahead and place him on an antibiotic today I do believe for this. 02/15/2019 on evaluation today patient actually appears to be doing somewhat better in regard to his ulcer. There is no signs of worsening at this time I did review his culture results which showed evidence of Staphylococcus aureus but not MRSA. Again this could just be more related to the normal skin  bacteria although he states the drainage has slowed down quite a bit he may have had a mild infection not just colonization. And was much smaller and then since around10/04/2019 on evaluation today patient appears to be doing unfortunately worse as far as the size of the wound. I really feel like that this is steadily getting larger again it had been doing excellent right at the beginning of September we have seen a steady increase in the area of the wound it is almost 2-1/2 times the size it was on September 1. Obviously this is a bad trend this is not wanting to see. For that reason we went back to using just the topical triamcinolone cream which does seem to help with inflammation. I checked him for bacteria by way of culture and nothing showed positive there. I am considering giving him a short course of a tapering steroid Dosepak today to see if that is can be beneficial for him. The patient is in agreement with giving that a try. 03/08/2019 on evaluation today patient appears to be doing very well in comparison to last evaluation with regard to his lower extremity ulcer. This is showing signs of less inflammation and actually measuring slightly smaller compared to last time every other week over the past month and a half he has been measuring larger larger larger. Nonetheless I do believe that the issue has been inflammation the prednisone does seem to [facility] ([id number]) have been beneficial for him which is good news. No fevers, chills, nausea, vomiting, or diarrhea. 03/22/2019 on evaluation today patient appears to be doing about the same with regard to his leg ulcer. He has been tolerating the dressing changes without complication. With that being said the wound seems to be mostly arrested at its current size but really is not making any progress except for when we prescribed the prednisone. He did show some signs of dropping as  far as the overall size of the wound during that interval  week. Nonetheless this is something he is not on long-term at this point and unfortunately I think he is getting need either this or else the Humira which his dermatologist has discussed try to get approval for. With that being said he will be seeing his dermatologist on the 11th of this month that is November. 04/19/2019 on evaluation today patient appears to be doing really about the same the wound is measuring slightly larger compared to last time I saw him. He has not been into the office since November 2 due to the fact that he unfortunately had Covid as that his entire family. He tells me that it was rough but they did pull-through and he seems to be doing much better. Fortunately there is no signs of active infection at this time. No fevers, chills, nausea, vomiting, or diarrhea. 05/10/2019 on evaluation today patient unfortunately appears to be doing significantly worse as compared to last time I saw him. He does tell me that he has had his first dose of Humira and actually is scheduled to get the next one in the upcoming week. With that being said he tells me also that in the past several days he has been having a lot of issues with green drainage she showed me a picture this is more blue-green in color. He is also been having issues with increased sloughy buildup and the wound does appear to be larger today. Obviously this is not the direction that we want everything to take based on the starting of his Humira. Nonetheless I think this is definitely a result of likely infection and to be honest I think this is probably Pseudomonas causing the infection based on what I am seeing. 05/24/2019 on evaluation today patient unfortunately appears to be doing significantly worse compared to his prior evaluation with me 2 weeks ago. I did review his culture results which showed that he does have Staph aureus as well as Pseudomonas noted on the culture. Nonetheless the Levaquin that I prescribed for him  does not appear to have been appropriate and in fact he tells me he is no longer experiencing the green drainage and discharge that he had at the last visit. Fortunately there is no signs of active infection at this time which is good news although the wound has significantly worsened it in fact is much deeper than it was previous. We have been utilizing up to this point triamcinolone ointment as the prescription topical of choice but at this time I really feel like that the wound is getting need to be packed in order to appropriately manage this due to the deeper nature of the wound. Therefore something along the lines of an alginate dressing may be more appropriate. 05/31/2019 upon inspection today patient's wound actually showed signs of doing poorly at this point. Unfortunately he just does not seem to be making any good progress despite what we have tried. He actually did go ahead and pick up the Cipro and start taking that as he was noticing more green drainage he had previously completed the Levaquin that I prescribed for him as well. Nonetheless he missed his appointment for the seventh last week on Wednesday with the wound care center and [facility] where his dermatologist referred him. Obviously I do think a second opinion would be helpful at this point especially in light of the fact that the patient seems to be doing so poorly despite  the fact that we have tried everything that I really know how at this point. The only thing that ever seems to have helped him in the past is when he was on high doses of continual steroids that did seem to make a difference for him. 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 06/07/2019 upon evaluation today patient unfortunately appears to be doing worse yet again with regard to his wound. In fact I  am starting to question whether or not he may have a fluid pocket in the muscle at this point based on the bulging and the soft appearance to the central portion of the muscle area. There is not anything draining from the muscle itself at this time which is good news but nonetheless the wound is expanding. I am not really seeing any results of the Humira as far as overall wound progression based on what I am seeing at this point. The patient has been referred for second opinion with regard to his wound to the [facility] wound care center by his dermatologist which I definitely am not in opposition to. Unfortunately we tried multiple dressings in the past including collagen, alginate, and at one point even Hydrofera Blue. With that being said he is never really used it for any significant amount of time due to the fact that he often complains of pain associated with these dressings and then will go back to either using the Santyl which she has done intermittently or more frequently the triamcinolone. He is also using his own compression stockings. We have wrapped him in the past but again that was something else that he really was not a big fan of. 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 He seemed to do much better when he was on the steroids although honestly I understand that the steroids are not necessarily the best medication to be on long-term obviously 06/14/2019 on evaluation today patient appears to be doing actually a little bit better with regard to the overall appearance with his leg. Unfortunately he does continue to have issues with what appears to be some fluid underneath  the muscle although he did see the wound specialty center at [facility] last week their main goals were to see about infusion therapy in place of the Humira as they feel like that is not quite strong enough. They also recommended that we continue with the treatment otherwise as we are they felt like that was appropriate and they are okay with him continuing to follow-up here with [place] in that regard. With that being said they are also sending him to the vein specialist there to see about vein stripping and if that would be of benefit for him. Subsequently they also did not really address whether or not an ultrasound of the muscle area to see if there is anything that needs to be addressed here would be appropriate or not. For that reason I discussed this with him last week I  think we may proceed down that road at this point. 06/21/2019 upon evaluation today patient's wound actually appears to be doing slightly better compared to previous evaluations. I do believe that he has made a difference with regard to the progression here with the use of oral steroids. Again in the past has been the only thing that is really calm things down. He does tell me that from [facility] is gotten a good news from there that there are no further vein stripping that is necessary at this point. I do not have that available for review today although the patient did relay this to me. He also did obtain and have the ultrasound of the wound completed which I did sign off on today. It does appear that there is no fluid collection under the muscle this is likely then just edematous tissue in general. That is also good news. Overall I still believe the inflammation is the main issue here. He did inquire about the possibility of a wound VAC again with the muscle protruding like it is I am not really sure whether the wound VAC is necessarily ideal or not. That is something we will have to consider although I do believe he may need compression  wrapping to try to help with edema control which could potentially be of benefit. 06/28/2019 on evaluation today patient appears to be doing slightly better measurement wise although this is not terribly smaller he least seems to be trending towards that direction. With that being said he still seems to have purulent drainage noted in the wound bed at this time. He has been on Levaquin followed by Cipro over the past month. Unfortunately he still seems to have some issues with active infection at this time. I did perform a culture last week in order to evaluate and see if indeed there was still anything going on. Subsequently the culture did come back showing Pseudomonas which is consistent with the drainage has been having which is blue-green in color. He also has had an odor that again was somewhat consistent with Pseudomonas as well. Long story short it appears that the culture showed an intermediate finding with regard to how well the Cipro will work for the Pseudomonas infection. Subsequently being that he does not seem to be clearing up and at best what we are doing is just keeping this at [place] I think he may need to see infectious disease to discuss IV antibiotic options. [name], [name] ([id number]) 07/05/2019 upon evaluation today patient appears to be doing okay in regard to his leg ulcer. He has been tolerating the dressing changes at this point without complication. Fortunately there is no signs of active infection at this time which is good news. No fevers, chills, nausea, vomiting, or diarrhea. With that being said he does have an appointment with infectious disease tomorrow and his primary care on Wednesday. Again the reason for the infectious disease referral was due to the fact that he did not seem to be fully resolving with the use of oral antibiotics and therefore we were thinking that IV antibiotic therapy may be necessary secondary to the fact that there was an intermediate finding for  how effective the Cipro may be. Nonetheless again he has been having a lot of purulent and even green drainage. Fortunately right now that seems to have calmed down over the past week with the reinitiation of the oral antibiotic. Nonetheless we will see what Dr. Megan Salon has to say. 07/12/2019 upon evaluation today patient appears  to be doing about the same at this point in regard to his left lower extremity ulcer. Fortunately there is no signs of active infection at this time which is good news I do believe the Levaquin has been beneficial I did review Dr. Hale Bogus note and to be honest I agree that the patient's leg does appear to be doing better currently. What we found in the past as he does not seem to really completely resolve he will stop the antibiotic and then subsequently things will revert back to having issues with blue-green drainage, increased pain, and overall worsening in general. Obviously that is the reason I sent him back to infectious disease. 07/19/2019 upon evaluation today patient appears to be doing roughly the same in size there is really no dramatic improvement. He has started back on the Levaquin at this point and though he seems to be doing okay he did still have a lot of blue/green drainage noted on evaluation today unfortunately. I think that this is still indicative more likely of a Pseudomonas infection as previously noted and again he does see Dr. Megan Salon in just a couple of days. I do not know that were really able to effectively clear this with just oral antibiotics alone based on what I am seeing currently. Nonetheless we are still continue to try to manage as best we can with regard to the patient and his wound. I do think the wrap was helpful in decreasing the edema which is excellent news. No fevers, chills, nausea, vomiting, or diarrhea. 07/26/2019 upon evaluation today patient appears to be doing slightly better with regard to the overall appearance of the muscle  there is no dark discoloration centrally. Fortunately there is no signs of active infection at this time. No fevers, chills, nausea, vomiting, or diarrhea. Patient's wound bed currently the patient did have an appointment with Dr. Megan Salon at infectious disease last week. With that being said Dr. Megan Salon the patient states was still somewhat hesitant about put him on any IV antibiotics he wanted [place] to repeat cultures today and then see where things go going forward. He does look like Dr. Megan Salon because of some improvement the patient did have with the Levaquin wanted [place] to see about repeating cultures. If it indeed grows the Pseudomonas again then he recommended a possibility of considering a PICC line placement and IV antibiotic therapy. He plans to see the patient back in 1 to 2 weeks. 08/02/2019 upon evaluation today patient appears to be doing poorly with regard to his left lower extremity. We did get the results of his culture back it shows that he is still showing evidence of Pseudomonas which is consistent with the purulent/blue-green drainage that he has currently. Subsequently the culture also shows that he now is showing resistance to the oral fluoroquinolones which is unfortunate as that was really the only thing to treat the infection prior. I do believe that he is looking like this is going require IV antibiotic therapy to get this under control. Fortunately there is no signs of systemic infection at this time which is good news. The patient does see Dr. Megan Salon tomorrow. 08/09/2019 upon evaluation today patient appears to be doing better with regard to his left lower extremity ulcer in regard to the overall appearance. He is currently on IV antibiotic therapy. As ordered by Dr. Megan Salon. Currently the patient is on ceftazidime which she is going to take for the next 2 weeks and then follow-up for 4 to 5-week appointment with  Dr. Megan Salon. The patient started this this past Friday  symptoms have not for a total of 3 days currently in full. 08/16/2019 upon evaluation today patient's wound actually does show muscle in the base of the wound but in general does appear to be much better as far as the overall evidence of infection is concerned. In fact I feel like this is for the most part cleared up he still on the IV antibiotics he has not completed the full course yet but I think he is doing much better which is excellent news. 08/23/2019 upon evaluation today patient appears to be doing about the same with regard to his wound at this point. He tells me that he still has pain unfortunately. Fortunately there is no evidence of systemic infection at this time which is great news. There is significant muscle protrusion. 09/13/19 upon evaluation today patient appears to be doing about the same in regard to his leg unfortunately. He still has a lot of drainage coming from the ulceration there is still muscle exposed. With that being said the patient's last wound culture still showed an intermediate finding with regard to the Pseudomonas he still having the bluish/green drainage as well. Overall I do not know that the wound has completely cleared of infection at this point. Fortunately there is no signs of active infection systemically at this point which is good news. 09/20/2019 upon evaluation today patient's wound actually appears to be doing about the same based on what I am seeing currently. I do not see any signs of systemic infection he still does have evidence of some local infection and drainage. He did see Dr. Megan Salon last week and Dr. Megan Salon states that he probably does need a different IV antibiotic although he does not want to put him on this until the patient begins the Remicade infusion which is actually scheduled for about 10 days out from today on 13 May. Following that time Dr. Megan Salon is good to see him back and then will evaluate the feasibility of starting him on the  IV antibiotic therapy once again at that point. I do not disagree with this plan I do believe as Dr. Megan Salon stated in his note that I reviewed today that the patient's issue is multifactorial with the pyoderma being 1 aspect of this that were hoping the Remicade will be helpful for her. In the meantime I think the gentamicin is, helping to keep things under decent okay control in regard to the ulcer. 09/27/2019 upon evaluation today patient appears to be doing about the same with regard to his wound still there is a lot of muscle exposure though he does have some hyper granulation tissue noted around the edge and actually some granulation tissue starting to form over the muscle which is actually good news. Fortunately there is no evidence of active infection which is also good news. His pain is less at this point. 5/21; this is a patient I have not seen in a long time. 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 He has a large wound on the left lateral leg with protruding muscle. He comes in the clinic today showing the same area on his left medial ankle. He says there is been a spot there for some time although we have not previously defined this. Today he has a clearly defined area with slight amount of skin breakdown surrounded by raised areas with a purplish hue in color. This is not painful he says it  is irritated. 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 He still has muscle protruding from the lateral portion of his left leg but fortunately the new area that they were concerned about at his last visit does not appear to have opened at this point. He is currently on Remicade infusions and seems to be doing better in my opinion in fact the wound itself seems to be overall much better. The purplish discoloration that he did have seems to have resolved  and I think that is a good sign that hopefully the Remicade is doing its job. He does have some biofilm noted over the surface of the wound. 11/01/2019 on evaluation today patient's wound actually appears to be doing excellent at this time. Fortunately there is no evidence of active infection and overall I feel like he is making great progress. The Remicade seems to be due excellent job in my opinion. [name], [name] ([id number]) 11/08/19 evaluation today vision actually appears to be doing quite well with regard to his weight ulcer. He's been tolerating dressing changes without complication. Fortunately there is no evidence of infection. No fevers, chills, nausea, or vomiting noted at this time. Overall states that is having more itching than pain which is actually a good sign in my opinion. 12/13/2019 upon evaluation today patient appears to be doing well today with regard to his wound. He has been tolerating the dressing changes without complication. Fortunately there is no sign of active infection at this time. No fevers, chills, nausea, vomiting, or diarrhea. Overall I feel like the infusion therapy has been very beneficial for him. 01/06/2020 on evaluation today patient appears to be doing well with regard to his wound. This is measuring smaller and actually looks to be doing better. Fortunately there is no signs of active infection at this point. No fevers, chills, nausea, vomiting, or diarrhea. With that being said he does still have the blue-green drainage but this does not seem to be causing any significant issues currently. He has been using the gentamicin that does seem to be keeping things under decent control at this point. He goes later this morning for his next infusion therapy for the pyoderma which seems to also be very beneficial. Electronic Signature(s) Signed: 01/06/2020 8:52:24 AM By: Worthy Keeler PA-C Entered By: Worthy Keeler on 01/06/2020 08:52:23 ZAMAURI, NEZ  ([id number]) -------------------------------------------------------------------------------- Physical Exam Details Patient Name: [name] Date of Service: 01/06/2020 8:00 AM Medical Record Number: [id number] Patient Account Number: [id number] Date of Birth/Sex: [birth year] (39 y.o. M) Treating RN: Army Melia Primary Care Provider: [name] Other Clinician: Referring Provider: [name] Treating Provider/Extender: Melburn Hake, Manases Etchison Weeks in Treatment: 85 Constitutional Well-nourished and well-hydrated in no acute distress. Respiratory normal breathing without difficulty. Psychiatric this patient is able to make decisions and demonstrates good insight into disease process. Alert and Oriented x 3. pleasant and cooperative. Notes Upon inspection patient's wound bed actually showed signs of good granulation at this time and there was some good epithelization around the edges of the wound as well. Overall I feel like he is actually managing quite nicely at this point in my opinion. Electronic Signature(s) Signed: 01/06/2020 8:53:29 AM By: Worthy Keeler PA-C Entered By: Worthy Keeler on 01/06/2020 08:53:29 PHARAOH, PIO ([id number]) -------------------------------------------------------------------------------- Physician Orders Details Patient Name: [name] Date of Service: 01/06/2020 8:00 AM Medical Record Number: [id number] Patient Account Number: [id number] Date of Birth/Sex: May 02, 1981 (38  y.o. 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 Anesthetic (add to Medication List) Wound #1 Left,Lateral Lower Leg o Topical Lidocaine 4% cream applied to wound bed prior to debridement (In Clinic Only). Primary Wound Dressing o Gentamicin Sulfate Cream - mix 1:1 with triamcinolone to apply to the wound bed Secondary Dressing Wound #1 Left,Lateral Lower Leg o Boardered Foam Dressing Dressing Change Frequency Wound #1 Left,Lateral Lower Leg o Change dressing every day. Follow-up Appointments Wound #1 Left,Lateral Lower Leg o Return Appointment in 1 week. Edema Control Wound #1 Left,Lateral Lower Leg o Patient to wear own compression stockings o Elevate legs to the level of the heart and pump ankles as often as possible Notes VOB Apligraf Electronic Signature(s) Signed: 01/06/2020 5:00:57 PM By: Worthy Keeler PA-C Signed: 01/06/2020 6:39:29 PM By: [name], [name], RN, [name], [name], [name] Entered By: [name], [name], RN, [name], [name] on 01/06/2020 08:44:28 [name] ([id number]) -------------------------------------------------------------------------------- Problem List Details Patient Name: [name] Date of Service: 01/06/2020 8:00 AM Medical Record Number: [id number] Patient Account Number: [id number] Date of Birth/Sex: 10/14/1980 (39 y.o. M) Treating RN: Army Melia Primary Care Provider: [name] Other Clinician: Referring Provider: [name] Treating Provider/Extender: Melburn Hake, Ryelle Ruvalcaba Weeks in Treatment: 161 Active Problems ICD-10 Encounter Code Description Active Date MDM Diagnosis L97.222 Non-pressure chronic ulcer of left calf with fat layer exposed 12/04/2016 No Yes L88 Pyoderma gangrenosum 03/26/2017 No Yes L97.321 Non-pressure chronic ulcer of left ankle limited to breakdown of skin 10/08/2019 No Yes I87.2 Venous insufficiency (chronic) (peripheral)  12/04/2016 No Yes L03.116 Cellulitis of left lower limb 05/24/2019 No Yes Inactive Problems ICD-10 Code Description Active Date Inactive Date L97.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 M) Treating RN: Army Melia Primary Care Provider: [name] Other Clinician: Referring Provider: [name] Treating Provider/Extender: Melburn Hake, Donyale Falcon Weeks in Treatment: 161 Subjective Chief Complaint Information obtained from Patient He is here in follow up evaluation for LLE pyoderma ulcer History of Present Illness (HPI) 12/04/16; 39 year old man who comes into the clinic today for review of a wound on the posterior left calf. He tells me that is been there for about a year. He is not a diabetic he does smoke half a pack per day. He was seen in the ER on 11/20/16 felt to have cellulitis around the wound and was given clindamycin. An x-ray did not show osteomyelitis. The patient initially tells me that he has a milk allergy that sets off a pruritic itching rash on his lower legs which she scratches incessantly and he thinks that's what may have set up the wound. He has been using various topical antibiotics and ointments without any effect. He works in a [employer]ing [employer] and is  on his feet all day. He does not have a prior history of wounds however he does have the rash on both lower legs the right arm and the ventral aspect of his left arm. These are excoriations and clearly have had scratching however  there are of macular looking areas on both legs including a substantial larger area on the right leg. This does not have an underlying open area. There is no blistering. The patient tells me that 2 years ago in [place] in response to the rash on his legs he saw a dermatologist who told him he had a condition which may be pyoderma gangrenosum although I may be putting words into his mouth. He seemed to recognize this. 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 He is not in any treatment for this. He's had no recent travel 12/11/16; the patient arrives today with his wound and roughly the same condition we've been using silver alginate this is a deep punched out wound with some surrounding erythema but no tenderness. Biopsy I did did not show confirmed pyoderma gangrenosum suggested nonspecific inflammation and vasculitis but does not provide an actual description of what was seen by the pathologist. I'm really not able to understand this We have also received information from the patient's dermatologist in [place] notes from April 2016. This was a doctor Agarwal-antal. 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 He was prescribed topical steroid high potency under occlusion which helped but at this point the patient did not have a deep punched out wound. 12/18/16; the patient's wound is larger in terms of surface area however this surface looks better and there is less depth. The surrounding erythema also is better. The patient states that the wrap we put on came off 2 days ago when he has been using his compression stockings. He we are in the process of getting a dermatology consult. 12/26/16 on evaluation today patient's left lower extremity wound shows evidence of infection with surrounding erythema noted. He has been tolerating the dressing changes but states that he has noted more discomfort. There is a larger area of erythema surrounding the wound.  No fevers, chills, nausea, or vomiting noted at this time. With that being said the wound still does have slough covering the surface. He is not allergic to any medication that he is aware of at this point. In regard to his right lower extremity he had several regions that are erythematous and pruritic he wonders if there's anything we can do to help that. 01/02/17 I reviewed patient's wound culture which was obtained his visit last week. He was placed on doxycycline at that point. Unfortunately that does not appear to be an antibiotic that would likely help with the situation however the pseudomonas noted on culture is sensitive to Cipro. Also unfortunately patient's wound seems to have a large compared to last week's evaluation. Not severely so but there are definitely increased measurements in general. He is continuing to have discomfort as well he writes this to be a seven out of 10. In fact he would prefer me not to perform any debridement today due to the fact that he is having discomfort and considering he has an active infection on the little reluctant to do so anyway. No fevers, chills, nausea, or vomiting noted at this time. 01/08/17; patient seems dermatology on September 5. I suspect dermatology will want the slides from the biopsy I did sent to their pathologist. I'm not  sure if there is a way we can expedite that. In any case the culture I did before I left on vacation 3 weeks ago showed Pseudomonas he was given 10 days of Cipro and per her description of her intake nurses is actually somewhat better this week although the wound is quite a bit bigger than I remember the last time I saw this. He still has 3 more days of Cipro 01/21/17; dermatology appointment tomorrow. He has completed the ciprofloxacin for Pseudomonas. Surface of the wound looks better however he is had some deterioration in the lesions on his right leg. Meantime the left lateral leg wound we will continue with  sample 01/29/17; patient had his dermatology appointment but I can't yet see that note. He is completed his antibiotics. The wound is more superficial but considerably larger in circumferential area than when he came in. This is in his left lateral calf. He also has swollen erythematous areas with superficial wounds on the right leg and small papular areas on both arms. There apparently areas in her his upper thighs and buttocks I did not look at those. Dermatology biopsied the right leg. Hopefully will have their input next week. 02/05/17; patient went back to see his dermatologist who told him that he had a "scratching problem" as well as staph. He is now on a 30 day course of doxycycline and I believe she gave him triamcinolone cream to the right leg areas to help with the itching [not exactly sure but probably triamcinolone]. She apparently looked at the left lateral leg wound although this was not rebiopsied and I think felt to be ultimately part of the same pathogenesis. He is using sample border foam and changing nevus himself. He now has a new open area on the right posterior leg which was his biopsy site I don't have any of the dermatology notes 02/12/17; we put the patient in compression last week with SANTYL to the wound on the left leg and the biopsy. Edema is much better and the depth of the wound is now at level of skin. Area is still the same Biopsy site on the right lateral leg we've also been using santyl with a border foam dressing and he is changing this himself. 02/19/17; Using silver alginate started last week to both the substantial left leg wound and the biopsy site on the right wound. He is tolerating compression well. Has a an appointment with his primary M.D. tomorrow wondering about diuretics although I'm wondering if the edema problem is actually lymphedema [name], [name] ([id number]) 02/26/17; the patient has been to see his primary doctor Dr. Jerrel Ivory at [place] our  primary care. She started him on Lasix 20 mg and this seems to have helped with the edema. However we are not making substantial change with the left lateral calf wound and inflammation. The biopsy site on the right leg also looks stable but not really all that different. 03/12/17; the patient has been to see vein and vascular Dr. Lucky Cowboy. He has had venous reflux studies I have not reviewed these. I did get a call from his dermatology office. 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 They therefore recommended clobetasol and tetracycline i.e. aggressive treatment  for possible pyoderma gangrenosum. 03/26/17; apparently the patient just had reflux studies not an appointment with Dr. dew. She arrives in clinic today having applied clobetasol for 2-3 weeks. He notes over the last 2-3 days excessive drainage having to change the dressing 3-4 times a day and also expanding erythema. 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 Lillia Carmel at [facility] locally who kindly saw him at my request. 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 My concern was the degree of erythema medially extending from his left leg wound which was either inflammation from pyoderma or cellulitis. I put him on Augmentin however culture of the wound showed Pseudomonas which is quinolone sensitive. I really don't believe he has cellulitis however in view of  everything I will continue and give him a course of Cipro. He is also on doxycycline as an immune modulator for the pyoderma. In addition to his original wound on the left lateral leg with surrounding erythema he has a wound on the right posterior calf which was an original biopsy site done by dermatology. 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 Saw Dr. Lillia Carmel yesterday. He has been using topical antibiotics to both wound areas his original wound on the left and the biopsies/pathergy area on the right. There is definitely some improvement in the inflammation around the wound on the right although the patient states he has increasing sensitivity of the wounds. He is on prednisone 60 and doxycycline 1 as prescribed by Dr. Lillia Carmel. He is covering the topical antibiotic with gauze and putting this in his own compression stocks and changing this daily. He states that Dr. Lottie Rater did a culture of the left leg wound yesterday 05/07/17; pyoderma gangrenosum. The patient saw Dr. Lillia Carmel yesterday and has a follow-up with her in one month. He is still using topical antibiotics to both wounds although he can't recall exactly what type. He is still on prednisone 60 mg. Dr. Lillia Carmel stated that the doxycycline could stop if we were in agreement. 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 The right medial leg was induced by biopsy/pathergy. The area on the right is essentially healed. 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 The right medial leg has closed and remains closed. He is still on prednisone 60. He tells me he missed his last dermatology appointment with Dr. Lillia Carmel but will make another appointment. He reports that her blood sugar at a  recent screen in [place] was high 200's. He was 180 today. 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 I think he is going to require still much longer prednisone perhaps another 3 months before attempting to taper. In the meantime his wound is a lot better. Smaller. He is cleaning this off daily and applying topical antibiotics. When he was last in the clinic I thought about changing to [facility] and actually put in a couple of calls to dermatology although probably not during their business hours. In any case the wound looks better smaller I  don't think there is any need to change what he is doing 08/06/17-he is here in follow up evaluation for pyoderma left leg ulcer. He continues on oral prednisone. He has been using triple antibiotic ointment. There is surface debris and we will transition to [facility] and have him return in 2 weeks. He has lost 30 pounds since his last appointment with lifestyle modification. He may benefit from topical steroid cream for treatment this can be considered at a later date. 08/22/17 on evaluation today patient appears to actually be doing rather well in regard to his left lateral lower extremity ulcer. He has actually been managed by Dr. Dellia Nims most recently. Patient is currently on oral steroids at this time. This seems to have been of benefit for him. Nonetheless his last visit was actually with Leah on 08/06/17. Currently he is not utilizing any topical steroid creams although this could be of benefit as well. No fevers, chills, nausea, or vomiting noted at this time. 09/05/17 on evaluation today patient appears to be doing better in regard to his left lateral lower extremity ulcer. He has been tolerating the dressing changes without complication. He is using Santyl with good effect. Overall I'm very pleased with how things are standing at this point. Patient likewise is happy that this is doing better. 09/19/17 on evaluation today patient actually appears to be doing  rather well in regard to his left lateral lower extremity ulcer. 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 He's not having any significant pain. 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 He has been tolerating the Santyl without complication and in general I feel like he's making good progress. 10/17/17 on evaluation today patient appears to be doing very well in regard to his left lateral lower surety ulcer. He has been tolerating the dressing changes without complication. There does not appear to be any evidence of infection he's alternating the Santyl and the triple antibiotic ointment every other day this seems to be doing well for him. 11/03/17 on evaluation today patient appears to be doing very well in regard to his left lateral lower extremity ulcer. He is been tolerating the dressing changes without complication which is good news. Fortunately there does not appear to be any evidence of infection which is also great news. Overall is doing excellent they are starting to taper down on the prednisone is down to 40 mg at this point it also started topical clobetasol for him. 11/17/17 on evaluation today patient appears to be doing well in regard to his left lateral lower surety ulcer. He's been tolerating the dressing changes without complication. He does note that he is having no pain, no excessive drainage or discharge, and overall he feels like things are going about how he would expect and hope they would. Overall he seems to have no evidence of infection at this time in my opinion which is good news. 12/04/17-He is seen in follow-up evaluation for right lateral lower extremity ulcer. He has been applying topical steroid cream. Today's measurement show slight increase in size. Over the next 2 weeks we will  transition to every other day Santyl and steroid cream. He has been encouraged to monitor for changes and notify clinic with any concerns 12/15/17 on evaluation today patient's left lateral motion the ulcer and fortunately is doing worse  again at this point. This just since last week to this week has close to doubled in size according to the patient. I did not seeing last week's I do not have a visual to compare this to in our system was also down so we do not have all the charts and at this point. Nonetheless it does have me somewhat concerned in regard to the fact that again he was worried enough about it he has contact the dermatology that placed them back on the full strength, 50 mg a day of the prednisone that he was taken previous. He continues to alternate using clobetasol along with Santyl at this point. He is obviously somewhat frustrated. [name], [name] ([id number]) 12/22/17 on evaluation today patient appears to be doing a little worse compared to last evaluation. Unfortunately the wound is a little deeper and slightly larger than the last week's evaluation. With that being said he has made some progress in regard to the irritation surrounding at this time unfortunately despite that progress that's been made he still has a significant issue going on here. 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 For that reason I discussed with him today the possibility of perform a wound culture to ensure there's nothing overtly infected. 01/06/18 on evaluation today patient's wound is larger and deeper than previously evaluated. With that being said it did appear that his wound was infected after my last evaluation with him. Subsequently I did end up prescribing a prescription for Bactrim DS which she has been taking and having no complication with.  Fortunately there does not appear to be any evidence of infection at this point in time as far as anything spreading, no want to touch, and overall I feel like things are showing signs of improvement. 01/13/18 on evaluation today patient appears to be even a little larger and deeper than last time. There still muscle exposed in the base of the wound. Nonetheless he does appear to be less erythematous I do believe inflammation is calming down also believe the infection looks like it's probably resolved at this time based on what I'm seeing. No fevers, chills, nausea, or vomiting noted at this time. 01/30/18 on evaluation today patient actually appears to visually look better for the most part. Unfortunately those visually this looks better he does seem to potentially have what may be an abscess in the muscle that has been noted in the central portion of the wound. This is the first time that I have noted what appears to be fluctuance in the central portion of the muscle. With that being said I'm somewhat more concerned about the fact that this might indicate an abscess formation at this location. I do believe that an ultrasound would be appropriate. This is likely something we need to try to do as soon as possible. He has been switch to mupirocin ointment and he is no longer using the steroid ointment as prescribed by dermatology he sees them again next week he's been decreased from 60 to 40 mg of prednisone. 03/09/18 on evaluation today patient actually appears to be doing a little better compared to last time I saw him. There's not as much erythema surrounding the wound itself. He I did review his most recent infectious disease note which was dated 02/24/18. He saw Dr. Michel Bickers in [place]. 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 Patient is now off of antibiotics and they are continually observing this.  There seems to be no change in the past two weeks in my pinion based on what the patient says and what I see today compared to what Dr. Megan Salon likely saw two weeks ago. No fevers, chills, nausea, or vomiting noted at this time. 03/23/18 on evaluation today patient's wound actually appears to be showing signs of improvement which is good news. He is currently still on the Dapsone. He is also working on tapering the prednisone to get off of this and Dr. Lottie Rater is working with him in this regard. Nonetheless overall I feel like the wound is doing well it does appear based on the infectious disease note that I reviewed from Dr. 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 I did also review the results of his ultrasound of left lower extremity which revealed there was a dentist tissue in the base of the wound without an abscess noted. 04/06/18 on evaluation today the patient's left lateral lower extremity ulcer actually appears to be doing fairly well which is excellent news. There does not appear to be any evidence of infection at this time which is also great news. Overall he still does have a significantly large ulceration although little by little he seems to be making progress. He is down to 10 mg a day of the prednisone. 04/20/18 on evaluation today patient actually appears to be doing excellent at this time in regard to his left lower extremity ulcer. He's making signs of good progress unfortunately this is taking much longer than we would really like to see but nonetheless he is making progress. Fortunately there does not appear to be any evidence of infection at this time. No fevers, chills, nausea, or vomiting noted at this time. The patient has not been using the Santyl due to the cost he hadn't got in this field yet. He's mainly been using the antibiotic ointment  topically. Subsequently he also tells me that he really has not been scrubbing in the shower I think this would be helpful again as I told him it doesn't have to be anything too aggressive to even make it believe just enough to keep it free of some of the loose slough and biofilm on the wound surface. 05/11/18 on evaluation today patient's wound appears to be making slow but sure progress in regard to the left lateral lower extremity ulcer. He is been tolerating the dressing changes without complication. Fortunately there does not appear to be any evidence of infection at this time. He is still just using triple antibiotic ointment along with clobetasol occasionally over the area. He never got the Santyl and really does not seem to intend to in my pinion. 06/01/18 on evaluation today patient appears to be doing a little better in regard to his left lateral lower extremity ulcer. He states that overall he does not feel like he is doing as well with the Dapsone as he did with the prednisone. Nonetheless he sees his dermatologist later today and is gonna talk to them about the possibility of going back on the prednisone. Overall again I believe that the wound would be better if you would utilize Santyl but he really does not seem to be interested in going  back to the Santyl at this point. He has been using triple antibiotic ointment. 06/15/18 on evaluation today patient's wound actually appears to be doing about the same at this point. Fortunately there is no signs of infection at this time. He has made slight improvements although he continues to not really want to clean the wound bed at this point. He states that he just doesn't mess with it he doesn't want to cause any problems with everything else he has going on. He has been on medication, antibiotics as prescribed by his dermatologist, for a staff infection of his lower extremities which is really drying out now and looking much better he tells  me. Fortunately there is no sign of overall infection. 06/29/18 on evaluation today patient appears to be doing well in regard to his left lateral lower surety ulcer all things considering. Fortunately his staff infection seems to be greatly improved compared to previous. He has no signs of infection and this is drying up quite nicely. He is still the doxycycline for this is no longer on cental, Dapsone, or any of the other medications. His dermatologist has recommended possibility of an infusion but right now he does not want to proceed with that. 07/13/18 on evaluation today patient appears to be doing about the same in regard to his left lateral lower surety ulcer. Fortunately there's no signs of infection at this time which is great news. Unfortunately he still builds up a significant amount of Slough/biofilm of the surface of the wound he still is not really cleaning this as he should be appropriately. Again I'm able to easily with saline and gauze remove the majority of this on the surface which if you would do this at home would likely be a dramatic improvement for him as far as getting the area to improve. Nonetheless overall I still feel like he is making progress is just very slow. I think Santyl will be of benefit for him as well. Still he has not gotten this as of this point. 07/27/18 on evaluation today patient actually appears to be doing little worse in regards of the erythema around the periwound region of the wound he also tells me that he's been having more drainage currently compared to what he was experiencing last time I saw him. He states not quite as bad as what he had because this was infected previously but nonetheless is still appears to be doing poorly. Fortunately there is no evidence of systemic infection at this point. The patient tells me that he is not going to be able to afford the Santyl. He is still waiting to hear about the infusion therapy with his dermatologist.  Apparently she wants an updated colonoscopy first. [name], [name] ([id number]) 08/10/18 on evaluation today patient appears to be doing better in regard to his left lateral lower extremity ulcer. Fortunately he is showing signs of improvement in this regard he's actually been approved for Remicade infusion's as well although this has not been scheduled as of yet. Fortunately there's no signs of active infection at this time in regard to the wound although he is having some issues with infection of the right lower extremity is been seen as dermatologist for this. Fortunately they are definitely still working with him trying to keep things under control. 09/07/18 on evaluation today patient is actually doing rather well in regard to his left lateral lower extremity ulcer. He notes these actually having some hair grow back on his extremity which is something he  has not seen in years. He also tells me that the pain is really not giving them any trouble at this time which is also good news overall she is very pleased with the progress he's using a combination of the mupirocin along with the probate is all mixed. 09/21/18 on evaluation today patient actually appears to be doing fairly well all things considered in regard to his looks from the ulcer. He's been tolerating the dressing changes without complication. Fortunately there's no signs of active infection at this time which is good news he is still on all antibiotics or prevention of the staff infection. He has been on prednisone for time although he states it is gonna contact his dermatologist and see if she put them on a short course due to some irritation that he has going on currently. Fortunately there's no evidence of any overall worsening this is going very slow I think cental would be something that would be helpful for him although he states that $50 for tube is quite expensive. He therefore is not willing to get that at this point. 10/06/18 on  evaluation today patient actually appears to be doing decently well in regard to his left lateral leg ulcer. He's been tolerating the dressing changes without complication. Fortunately there's no signs of active infection at this time. Overall I'm actually rather pleased with the progress he's making although it's slow he doesn't show any signs of infection and he does seem to be making some improvement. I do believe that he may need a switch up and dressings to try to help this to heal more appropriately and quickly. 10/19/18 on evaluation today patient actually appears to be doing better in regard to his left lateral lower extremity ulcer. This is shown signs of having much less Slough buildup at this point due to the fact he has been using the [employer]. Obviously this is very good news. The overall size of the wound is not dramatically smaller but again the appearance is. 11/02/18 on evaluation today patient actually appears to be doing quite well in regard to his lower [place] ulcer. A lot of the skin around the ulcer is actually somewhat irritating at this point this seems to be more due to the dressing causing irritation from the adhesive that anything else. Fortunately there is no signs of active infection at this time. 11/24/18 on evaluation today patient appears to be doing a little worse in regard to his overall appearance of his lower extremity ulcer. There's more erythema and warmth around the wound unfortunately. He is currently on doxycycline which he has been on for some time. With that being said I'm not sure that seems to be helping with what appears to possibly be an acute cellulitis with regard to his left lower extremity ulcer. No fevers, chills, nausea, or vomiting noted at this time. 12/08/18 on evaluation today patient's wounds actually appears to be doing significantly better compared to his last evaluation. He has been using Santyl along with alternating tripling about appointment as  well as the steroid cream seems to be doing quite well and the wound is showing signs of improvement which is excellent news. Fortunately there's no evidence of infection and in fact his culture came back negative with only normal skin flora noted. 12/21/2018 upon evaluation today patient actually appears to be doing excellent with regard to his ulcer. This is actually the best that I have seen it since have been helping to take care of him. It is  both smaller as well as less slough noted on the surface of the wound and seems to be showing signs of good improvement with new skin growing from the edges. He has been using just the triamcinolone he does wonder if he can get a refill of that ointment today. 01/04/2019 upon evaluation today patient actually appears to be doing well with regard to his left lateral lower extremity ulcer. With that being said it does not appear to be that he is doing quite as well as last time as far as progression is concerned. There does not appear to be any signs of infection or significant irritation which is good news. With that being said I do believe that he may benefit from switching to a collagen based dressing based on how clean The wound appears. 01/18/2019 on evaluation today patient actually appears to be doing well with regard to his wound on the left lower extremity. He is not made a lot of progress compared to where we were previous but nonetheless does seem to be doing okay at this time which is good news. There is no signs of active infection which is also good news. My only concern currently is I do wish we can get him into utilizing the collagen dressing his insurance would not pay for the supplies that we ordered although it appears that he may be able to order this through his supply company that he typically utilizes. This is [employer]. Nonetheless he did try to order it during the office visit today and it appears this did go through. We will see if he  can get that it is a different brand but nonetheless he has collagen and I do think will be beneficial. 02/01/2019 on evaluation today patient actually appears to be doing a little worse today in regard to the overall size of his wounds. Fortunately there is no signs of active infection at this time. That is visually. Nonetheless when this is happened before it was due to infection. For that reason were somewhat concerned about that this time as well. 02/08/2019 on evaluation today patient unfortunately appears to be doing slightly worse with regard to his wound upon evaluation today. Is measuring a little deeper and a little larger unfortunately. I am not really sure exactly what is causing this to enlarge he actually did see his dermatologist she is going to see about initiating Humira for him. Subsequently she also did do steroid injections into the wound itself in the periphery. Nonetheless still nonetheless he seems to be getting a little bit larger he is gone back to just using the steroid cream topically which I think is appropriate. I would say hold off on the collagen for the time being is definitely a good thing to do. Based on the culture results which we finally did get the final result back regarding it shows staph as the bacteria noted again that can be a normal skin bacteria based on the fact however he is having increased drainage and worsening of the wound measurement wise I would go ahead and place him on an antibiotic today I do believe for this. 02/15/2019 on evaluation today patient actually appears to be doing somewhat better in regard to his ulcer. There is no signs of worsening at this time I did review his culture results which showed evidence of Staphylococcus aureus but not MRSA. Again this could just be more related to the normal skin bacteria although he states the drainage has slowed down quite a  bit he may have had a mild infection not just colonization. And was much  smaller and then since around10/04/2019 on evaluation today patient appears to be doing unfortunately worse as far as the size of the wound. I really feel like that this is steadily getting larger again it had been doing excellent right at the beginning of September we have seen a steady increase in the area of the wound it is almost 2-1/2 times the size it was on September 1. Obviously this is a bad trend this is not wanting to see. For that reason we went back to using just the topical triamcinolone cream which does seem to help with inflammation. I checked him for bacteria by way of culture and nothing showed positive there. I am considering giving him a short course of a tapering steroid [name], Abou ([id number]) today to see if that is can be beneficial for him. The patient is in agreement with giving that a try. 03/08/2019 on evaluation today patient appears to be doing very well in comparison to last evaluation with regard to his lower extremity ulcer. This is showing signs of less inflammation and actually measuring slightly smaller compared to last time every other week over the past month and a half he has been measuring larger larger larger. Nonetheless I do believe that the issue has been inflammation the prednisone does seem to have been beneficial for him which is good news. No fevers, chills, nausea, vomiting, or diarrhea. 03/22/2019 on evaluation today patient appears to be doing about the same with regard to his leg ulcer. He has been tolerating the dressing changes without complication. With that being said the wound seems to be mostly arrested at its current size but really is not making any progress except for when we prescribed the prednisone. He did show some signs of dropping as far as the overall size of the wound during that interval week. Nonetheless this is something he is not on long-term at this point and unfortunately I think he is getting need either this or else  the Humira which his dermatologist has discussed try to get approval for. With that being said he will be seeing his dermatologist on the 11th of this month that is November. 04/19/2019 on evaluation today patient appears to be doing really about the same the wound is measuring slightly larger compared to last time I saw him. He has not been into the office since November 2 due to the fact that he unfortunately had Covid as that his entire family. He tells me that it was rough but they did pull-through and he seems to be doing much better. Fortunately there is no signs of active infection at this time. No fevers, chills, nausea, vomiting, or diarrhea. 05/10/2019 on evaluation today patient unfortunately appears to be doing significantly worse as compared to last time I saw him. He does tell me that he has had his first dose of Humira and actually is scheduled to get the next one in the upcoming week. With that being said he tells me also that in the past several days he has been having a lot of issues with green drainage she showed me a picture this is more blue-green in color. He is also been having issues with increased sloughy buildup and the wound does appear to be larger today. Obviously this is not the direction that we want everything to take based on the starting of his Humira. Nonetheless I think this is definitely a  result of likely infection and to be honest I think this is probably Pseudomonas causing the infection based on what I am seeing. 05/24/2019 on evaluation today patient unfortunately appears to be doing significantly worse compared to his prior evaluation with me 2 weeks ago. I did review his culture results which showed that he does have Staph aureus as well as Pseudomonas noted on the culture. Nonetheless the Levaquin that I prescribed for him does not appear to have been appropriate and in fact he tells me he is no longer experiencing the green drainage and discharge that he  had at the last visit. Fortunately there is no signs of active infection at this time which is good news although the wound has significantly worsened it in fact is much deeper than it was previous. We have been utilizing up to this point triamcinolone ointment as the prescription topical of choice but at this time I really feel like that the wound is getting need to be packed in order to appropriately manage this due to the deeper nature of the wound. Therefore something along the lines of an alginate dressing may be more appropriate. 05/31/2019 upon inspection today patient's wound actually showed signs of doing poorly at this point. Unfortunately he just does not seem to be making any good progress despite what we have tried. He actually did go ahead and pick up the Cipro and start taking that as he was noticing more green drainage he had previously completed the Levaquin that I prescribed for him as well. Nonetheless he missed his appointment for the seventh last week on Wednesday with the wound care center and [facility] where his dermatologist referred him. Obviously I do think a second opinion would be helpful at this point especially in light of the fact that the patient seems to be doing so poorly despite the fact that we have tried everything that I really know how at this point. The only thing that ever seems to have helped him in the past is when he was on high doses of continual steroids that did seem to make a difference for him. 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 06/07/2019 upon evaluation today patient unfortunately appears to be doing worse yet again with regard to his wound. In fact I am starting to question whether or not he may have a fluid pocket in the muscle at this point based on the bulging and the soft  appearance to the central portion of the muscle area. There is not anything draining from the muscle itself at this time which is good news but nonetheless the wound is expanding. I am not really seeing any results of the Humira as far as overall wound progression based on what I am seeing at this point. The patient has been referred for second opinion with regard to his wound to the [facility] wound care center by his dermatologist which I definitely am not in opposition to. Unfortunately we tried multiple dressings in the past including collagen, alginate, and at one point even Hydrofera Blue. With that being said he is never really used it for any significant amount of time due to the fact that he often complains of pain associated with these dressings and then will go back to either using the Santyl which she has  done intermittently or more frequently the triamcinolone. He is also using his own compression stockings. We have wrapped him in the past but again that was something else that he really was not a big fan of. 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 He seemed to do much better when he was on the steroids although honestly I understand that the steroids are not necessarily the best medication to be on long-term obviously 06/14/2019 on evaluation today patient appears to be doing actually a little bit better with regard to the overall appearance with his leg. Unfortunately he does continue to have issues with what appears to be some fluid underneath the muscle although he did see the wound specialty center at [facility] last week their main goals were to see about infusion therapy in  place of the Humira as they feel like that is not quite strong enough. They also recommended that we continue with the treatment otherwise as we are they felt like that was appropriate and they are okay with him continuing to follow-up here with [place] in that regard. With that being said they are also sending him to the vein specialist there to see about vein stripping and if that would be of benefit for him. Subsequently they also did not really address whether or not an ultrasound of the muscle area to see if there is anything that needs to be addressed here would be appropriate or not. For that reason I discussed this with him last week I think we may proceed down that road at this point. 06/21/2019 upon evaluation today patient's wound actually appears to be doing slightly better compared to previous evaluations. I do believe that he has made a difference with regard to the progression here with the use of oral steroids. Again in the past has been the only thing that is really calm things down. He does tell me that from [facility] is gotten a good news from there that there are no further vein stripping that is necessary at this point. I do not have that available for review today although the patient did relay this to me. He also did obtain and have the ultrasound of the wound completed which I did sign off on today. It does appear that there is no fluid collection under the muscle this is likely then just edematous tissue in general. That is also good news. Overall I still believe the inflammation is the main issue here. He did inquire about the possibility of a wound VAC again with the muscle protruding like it is I am not really sure whether the wound VAC is necessarily ideal or not. That is something we will have to consider although I do believe he may need compression wrapping to try to help with edema control which could potentially be of benefit. 06/28/2019 on evaluation today patient appears to be  doing slightly better measurement wise although this is not terribly smaller he least seems to be trending towards that direction. With that being said he still seems to have purulent drainage noted in the wound bed at this time. He has been on Levaquin followed by Cipro over  the past month. Unfortunately he still seems to have some issues with active infection at this time. I did perform a culture last week in order to evaluate and see if indeed there was still anything going on. Subsequently the culture did come back [name], [name] ([id number]) showing Pseudomonas which is consistent with the drainage has been having which is blue-green in color. He also has had an odor that again was somewhat consistent with Pseudomonas as well. Long story short it appears that the culture showed an intermediate finding with regard to how well the Cipro will work for the Pseudomonas infection. Subsequently being that he does not seem to be clearing up and at best what we are doing is just keeping this at [place] I think he may need to see infectious disease to discuss IV antibiotic options. 07/05/2019 upon evaluation today patient appears to be doing okay in regard to his leg ulcer. He has been tolerating the dressing changes at this point without complication. Fortunately there is no signs of active infection at this time which is good news. No fevers, chills, nausea, vomiting, or diarrhea. With that being said he does have an appointment with infectious disease tomorrow and his primary care on Wednesday. Again the reason for the infectious disease referral was due to the fact that he did not seem to be fully resolving with the use of oral antibiotics and therefore we were thinking that IV antibiotic therapy may be necessary secondary to the fact that there was an intermediate finding for how effective the Cipro may be. Nonetheless again he has been having a lot of purulent and even green drainage. Fortunately right now  that seems to have calmed down over the past week with the reinitiation of the oral antibiotic. Nonetheless we will see what Dr. Megan Salon has to say. 07/12/2019 upon evaluation today patient appears to be doing about the same at this point in regard to his left lower extremity ulcer. Fortunately there is no signs of active infection at this time which is good news I do believe the Levaquin has been beneficial I did review Dr. Hale Bogus note and to be honest I agree that the patient's leg does appear to be doing better currently. What we found in the past as he does not seem to really completely resolve he will stop the antibiotic and then subsequently things will revert back to having issues with blue-green drainage, increased pain, and overall worsening in general. Obviously that is the reason I sent him back to infectious disease. 07/19/2019 upon evaluation today patient appears to be doing roughly the same in size there is really no dramatic improvement. He has started back on the Levaquin at this point and though he seems to be doing okay he did still have a lot of blue/green drainage noted on evaluation today unfortunately. I think that this is still indicative more likely of a Pseudomonas infection as previously noted and again he does see Dr. Megan Salon in just a couple of days. I do not know that were really able to effectively clear this with just oral antibiotics alone based on what I am seeing currently. Nonetheless we are still continue to try to manage as best we can with regard to the patient and his wound. I do think the wrap was helpful in decreasing the edema which is excellent news. No fevers, chills, nausea, vomiting, or diarrhea. 07/26/2019 upon evaluation today patient appears to be doing slightly better with regard to the overall appearance of the  muscle there is no dark discoloration centrally. Fortunately there is no signs of active infection at this time. No fevers, chills, nausea,  vomiting, or diarrhea. Patient's wound bed currently the patient did have an appointment with Dr. Megan Salon at infectious disease last week. With that being said Dr. Megan Salon the patient states was still somewhat hesitant about put him on any IV antibiotics he wanted [place] to repeat cultures today and then see where things go going forward. He does look like Dr. Megan Salon because of some improvement the patient did have with the Levaquin wanted [place] to see about repeating cultures. If it indeed grows the Pseudomonas again then he recommended a possibility of considering a PICC line placement and IV antibiotic therapy. He plans to see the patient back in 1 to 2 weeks. 08/02/2019 upon evaluation today patient appears to be doing poorly with regard to his left lower extremity. We did get the results of his culture back it shows that he is still showing evidence of Pseudomonas which is consistent with the purulent/blue-green drainage that he has currently. Subsequently the culture also shows that he now is showing resistance to the oral fluoroquinolones which is unfortunate as that was really the only thing to treat the infection prior. I do believe that he is looking like this is going require IV antibiotic therapy to get this under control. Fortunately there is no signs of systemic infection at this time which is good news. The patient does see Dr. Megan Salon tomorrow. 08/09/2019 upon evaluation today patient appears to be doing better with regard to his left lower extremity ulcer in regard to the overall appearance. He is currently on IV antibiotic therapy. As ordered by Dr. Megan Salon. Currently the patient is on ceftazidime which she is going to take for the next 2 weeks and then follow-up for 4 to 5-week appointment with Dr. Megan Salon. The patient started this this past Friday symptoms have not for a total of 3 days currently in full. 08/16/2019 upon evaluation today patient's wound actually does show muscle in  the base of the wound but in general does appear to be much better as far as the overall evidence of infection is concerned. In fact I feel like this is for the most part cleared up he still on the IV antibiotics he has not completed the full course yet but I think he is doing much better which is excellent news. 08/23/2019 upon evaluation today patient appears to be doing about the same with regard to his wound at this point. He tells me that he still has pain unfortunately. Fortunately there is no evidence of systemic infection at this time which is great news. There is significant muscle protrusion. 09/13/19 upon evaluation today patient appears to be doing about the same in regard to his leg unfortunately. He still has a lot of drainage coming from the ulceration there is still muscle exposed. With that being said the patient's last wound culture still showed an intermediate finding with regard to the Pseudomonas he still having the bluish/green drainage as well. Overall I do not know that the wound has completely cleared of infection at this point. Fortunately there is no signs of active infection systemically at this point which is good news. 09/20/2019 upon evaluation today patient's wound actually appears to be doing about the same based on what I am seeing currently. I do not see any signs of systemic infection he still does have evidence of some local infection and drainage. He did see  Dr. Megan Salon last week and Dr. Megan Salon states that he probably does need a different IV antibiotic although he does not want to put him on this until the patient begins the Remicade infusion which is actually scheduled for about 10 days out from today on 13 May. Following that time Dr. Megan Salon is good to see him back and then will evaluate the feasibility of starting him on the IV antibiotic therapy once again at that point. I do not disagree with this plan I do believe as Dr. Megan Salon stated in his note that I  reviewed today that the patient's issue is multifactorial with the pyoderma being 1 aspect of this that were hoping the Remicade will be helpful for her. In the meantime I think the gentamicin is, helping to keep things under decent okay control in regard to the ulcer. 09/27/2019 upon evaluation today patient appears to be doing about the same with regard to his wound still there is a lot of muscle exposure though he does have some hyper granulation tissue noted around the edge and actually some granulation tissue starting to form over the muscle which is actually good news. Fortunately there is no evidence of active infection which is also good news. His pain is less at this point. 5/21; this is a patient I have not seen in a long time. 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 He has a large wound on the left lateral leg with protruding muscle. He comes in the clinic today showing the same area on his left medial ankle. He says there is been a spot there for some time although we have not previously defined this. Today he has a clearly defined area with slight amount of skin breakdown surrounded by raised areas with a purplish hue in color. This is not painful he says it is irritated. 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 He still has muscle protruding from the lateral portion of his left leg but fortunately the new area that they were concerned about at his last visit does not appear to have opened at this point. He is currently on Remicade infusions and seems to be doing better in my opinion in fact the wound itself seems to be overall much better. The purplish discoloration that he did have seems to have resolved and I think that is a good sign that hopefully the Remicade is doing its job. He does [name], [name] ([id number]) have some biofilm noted  over the surface of the wound. 11/01/2019 on evaluation today patient's wound actually appears to be doing excellent at this time. Fortunately there is no evidence of active infection and overall I feel like he is making great progress. The Remicade seems to be due excellent job in my opinion. 11/08/19 evaluation today vision actually appears to be doing quite well with regard to his weight ulcer. He's been tolerating dressing changes without complication. Fortunately there is no evidence of infection. No fevers, chills, nausea, or vomiting noted at this time. Overall states that is having more itching than pain which is actually a good sign in my opinion. 12/13/2019 upon evaluation today patient appears to be doing well today with regard to his wound. He has been tolerating the dressing changes without complication. Fortunately there is no sign of active infection at this time. No fevers, chills, nausea, vomiting, or diarrhea. Overall I feel like the infusion therapy  has been very beneficial for him. 01/06/2020 on evaluation today patient appears to be doing well with regard to his wound. This is measuring smaller and actually looks to be doing better. Fortunately there is no signs of active infection at this point. No fevers, chills, nausea, vomiting, or diarrhea. With that being said he does still have the blue-green drainage but this does not seem to be causing any significant issues currently. He has been using the gentamicin that does seem to be keeping things under decent control at this point. He goes later this morning for his next infusion therapy for the pyoderma which seems to also be very beneficial. Objective Constitutional Well-nourished and well-hydrated in no acute distress. Vitals Time Taken: 8:20 AM, Height: 71 in, Weight: 338 lbs, BMI: 47.1, Temperature: 98.2 F, Pulse: 68 bpm, Respiratory Rate: 20 breaths/min, Blood Pressure: 137/90 mmHg. Respiratory normal breathing without  difficulty. Psychiatric this patient is able to make decisions and demonstrates good insight into disease process. Alert and Oriented x 3. pleasant and cooperative. General Notes: Upon inspection patient's wound bed actually showed signs of good granulation at this time and there was some good epithelization around the edges of the wound as well. Overall I feel like he is actually managing quite nicely at this point in my opinion. Integumentary (Hair, Skin) Wound #1 status is Open. Original cause of wound was Gradually Appeared. The wound is located on the Left,Lateral Lower Leg. The wound measures 5.8cm length x 5cm width x 0.3cm depth; 22.777cm^2 area and 6.833cm^3 volume. There is muscle and Fat Layer (Subcutaneous Tissue) Exposed exposed. There is a large amount of purulent drainage noted. The wound margin is epibole. There is large (67-100%) pink granulation within the wound bed. There is a small (1-33%) amount of necrotic tissue within the wound bed including Adherent Slough. 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 Anesthetic (add to Medication List): Wound #1 Left,Lateral Lower Leg: Topical Lidocaine 4% cream applied to wound bed prior to debridement (In Clinic Only). Primary Wound Dressing: Gentamicin Sulfate Cream - mix 1:1 with triamcinolone to apply to the wound bed Secondary Dressing: Wound #1 Left,Lateral Lower Leg: Boardered Foam Dressing Dressing Change Frequency: Wound #1 Left,Lateral Lower Leg: Change dressing every day. Follow-up Appointments: Wound #1 Left,Lateral Lower Leg: Return Appointment in 1 week. Edema Control: Wound #1  Left,Lateral Lower Leg: Patient to wear own compression stockings Elevate legs to the level of the heart and pump ankles as often as possible General Notes: VOB Apligraf 1. I am going to suggest at this point that the patient continue to be monitored for any signs of worsening infection. Obviously he still has blue- green drainage which could be a chronic superinfection with Pseudomonas but again right now Dr. Fredrik Rigger has not indicated any additional IV antibiotic therapy. Unfortunately oral antibiotics were not effective. 2. He will continue with topical gentamicin followed by the bordered foam dressing. 3. Is also continuing with his infusion therapy which does seem to be helpful. We will see patient back for reevaluation in 1 week here in the clinic. If anything worsens or changes patient will contact our office for additional recommendations. Electronic Signature(s) Signed: 01/06/2020 8:54:07 AM By: Worthy Keeler PA-C Entered By: Worthy Keeler  on 01/06/2020 08:54:06 THAYNE, CINDRIC ([id number]) -------------------------------------------------------------------------------- SuperBill Details Patient Name: MARKELL, SCIASCIA Date of Service: 01/06/2020 Medical Record Number: [id number] Patient Account Number: [id number] Date of Birth/Sex: Jul 05, 1980 (39 y.o. M) Treating RN: Army Melia Primary Care Provider: [name] Other Clinician: Referring Provider: [name] Treating Provider/Extender: Melburn Hake, Remmie Bembenek Weeks in Treatment: 161 Diagnosis Coding ICD-10 Codes Code Description [phone] Non-pressure chronic ulcer of left calf with fat layer exposed L88 Pyoderma gangrenosum L97.321 Non-pressure chronic ulcer of left ankle limited to breakdown of skin I87.2 Venous insufficiency (chronic) (peripheral) L03.116 Cellulitis of left lower limb Facility Procedures CPT4 Code: [id number] Description: 99213 - WOUND CARE VISIT-LEV 3 EST PT Modifier: Quantity: 1 Physician  Procedures CPT4 Code: 2023343 Description: 56861 - WC PHYS LEVEL 3 - EST PT Modifier: Quantity: 1 CPT4 Code: Description: ICD-10 Diagnosis Description L97.222 Non-pressure chronic ulcer of left calf with fat layer exposed L88 Pyoderma gangrenosum L97.321 Non-pressure chronic ulcer of left ankle limited to breakdown of skin I87.2 Venous insufficiency (chronic)  (peripheral) Modifier: Quantity: Electronic Signature(s) Signed: 01/06/2020 8:54:24 AM By: Worthy Keeler PA-C Entered By: Worthy Keeler on 01/06/2020 08:54:24

## 2020-01-07 NOTE — Progress Notes (Signed)
[name], [name] ([id number]) Visit Report for 01/06/2020 Arrival Information Details Patient Name: [name] Date of Service: 01/06/2020 8:00 AM Medical Record Number: [id number] Patient Account Number: [id number] Date of Birth/Sex: 1980-05-28 (39 y.o. M) Treating RN: [name] Primary Care [name] Other Clinician: Referring [name] Treating Andree Heeg/Extender: Melburn Hake, [name]: 64 Visit Information History Since Last Visit All ordered tests and consults were completed: No Patient Arrived: Ambulatory Added or deleted any medications: No Arrival Time: 08:20 Any new allergies or adverse reactions: No Accompanied By: self Had a fall or experienced change in No Transfer Assistance: None activities of daily living that may affect Patient Identification Verified: Yes risk of falls: Secondary Verification Process Completed: Yes Signs or symptoms of abuse/neglect since last visito No Patient Requires Transmission-Based No Hospitalized since last visit: No Precautions: Implantable device outside of the clinic excluding No Patient Has Alerts: Yes cellular tissue based products placed in the center Patient Alerts: 08/09/19 Picc Line since last visit: Right Has Dressing in Place as Prescribed: Yes Pain Present Now: No Electronic Signature(s) Signed: 01/07/2020 8:17:26 AM By: [name], [name], RN, [name], [name], [name] Previous Signature: 01/06/2020 10:49:47 AM Version By: Darci Needle Entered By: [name], [name], RN, [name], [name] on 01/07/2020 08:17:25 [name] ([id number]) -------------------------------------------------------------------------------- Clinic Level of Care Assessment Details Patient Name: [name] Date of Service: 01/06/2020 8:00 AM Medical Record Number: [id number] Patient Account Number: [id number] Date of Birth/Sex: 1981/02/10 (39 y.o. M) Treating RN: [name] Primary Care [name] Other  Clinician: Referring [name] Treating Jeb Schloemer/Extender: Melburn Hake, [name]: 161 Clinic Level of Care Assessment Items TOOL 4 Quantity Score []  - Use when only an EandM is performed on FOLLOW-UP visit 0 ASSESSMENTS - Nursing Assessment / Reassessment X - Reassessment of Co-morbidities (includes updates in patient status) 1 10 X- 1 5 Reassessment of Adherence to Treatment Plan ASSESSMENTS - Wound and Skin Assessment / Reassessment X - Simple Wound Assessment / Reassessment - one wound 1 5 []  - 0 Complex Wound Assessment / Reassessment - multiple wounds []  - 0 Dermatologic / Skin Assessment (not related to wound area) ASSESSMENTS - Focused Assessment []  - Circumferential Edema Measurements - multi extremities 0 []  - 0 Nutritional Assessment / Counseling / Intervention []  - 0 Lower Extremity Assessment (monofilament, tuning fork, pulses) []  - 0 Peripheral Arterial Disease Assessment (using hand held doppler) ASSESSMENTS - Ostomy and/or Continence Assessment and Care []  - Incontinence Assessment and Management 0 []  - 0 Ostomy Care Assessment and Management (repouching, etc.) PROCESS - Coordination of Care X - Simple Patient / Family Education for ongoing care 1 15 []  - 0 Complex (extensive) Patient / Family Education for ongoing care X- 1 10 Staff obtains Programmer, systems, Records, Test Results / Process Orders []  - 0 Staff telephones HHA, Nursing Homes / Clarify orders / etc []  - 0 Routine Transfer to another Facility (non-emergent condition) []  - 0 Routine Hospital Admission (non-emergent condition) []  - 0 New Admissions / Biomedical engineer / Ordering NPWT, Apligraf, etc. []  - 0 Emergency Hospital Admission (emergent condition) X- 1 10 Simple Discharge Coordination []  - 0 Complex (extensive) Discharge Coordination PROCESS - Special Needs []  - Pediatric / Minor Patient Management 0 []  - 0 Isolation Patient Management []  - 0 Hearing  / Language / Visual special needs []  - 0 Assessment of Community assistance (transportation, D/C planning, etc.) []  - 0 Additional assistance / Altered mentation []  - 0 Support Surface(s) Assessment (bed, cushion,  seat, etc.) INTERVENTIONS - Wound Cleansing / Measurement [name], [name] ([id number]) X- 1 5 Simple Wound Cleansing - one wound []  - 0 Complex Wound Cleansing - multiple wounds X- 1 5 Wound Imaging (photographs - any number of wounds) []  - 0 Wound Tracing (instead of photographs) X- 1 5 Simple Wound Measurement - one wound []  - 0 Complex Wound Measurement - multiple wounds INTERVENTIONS - Wound Dressings []  - Small Wound Dressing one or multiple wounds 0 X- 1 15 Medium Wound Dressing one or multiple wounds []  - 0 Large Wound Dressing one or multiple wounds []  - 0 Application of Medications - topical []  - 0 Application of Medications - injection INTERVENTIONS - Miscellaneous []  - External ear exam 0 []  - 0 Specimen Collection (cultures, biopsies, blood, body fluids, etc.) []  - 0 Specimen(s) / Culture(s) sent or taken to Lab for analysis []  - 0 Patient Transfer (multiple staff / Civil Service fast streamer / Similar devices) []  - 0 Simple Staple / Suture removal (25 or less) []  - 0 Complex Staple / Suture removal (26 or more) []  - 0 Hypo / Hyperglycemic Management (close monitor of Blood Glucose) []  - 0 Ankle / Brachial Index (ABI) - do not check if billed separately X- 1 5 Vital Signs Has the patient been seen at the hospital within the last three years: Yes Total Score: 90 Level Of Care: New/Established - Level 3 Electronic Signature(s) Signed: 01/06/2020 6:39:29 PM By: [name], [name], RN, [name], [name], [name] Entered By: [name], [name], RN, [name], [name] on 01/06/2020 08:45:03 [name] ([id number]) -------------------------------------------------------------------------------- Encounter Discharge Information Details Patient Name: [name] Date of Service: 01/06/2020 8:00  AM Medical Record Number: [id number] Patient Account Number: [id number] Date of Birth/Sex: 1980/06/18 (39 y.o. M) Treating RN: [name] Primary Care Addalee Kavanagh: Alma [name] Other Clinician: Referring Lynden Flemmer: Alma [name] Treating Shakirah Kirkey/Extender: Melburn Hake, [name]: 161 Encounter Discharge Information Items Discharge Condition: Stable Ambulatory Status: Ambulatory Discharge Destination: Home Transportation: Private Auto Accompanied By: self Schedule Follow-up Appointment: Yes Clinical Summary of Care: Electronic Signature(s) Signed: 01/06/2020 6:39:29 PM By: [name], [name], RN, [name], [name], [name] Entered By: [name], [name], RN, [name], [name] on 01/06/2020 08:46:16 [name] ([id number]) -------------------------------------------------------------------------------- Lower Extremity Assessment Details Patient Name: [name] Date of Service: 01/06/2020 8:00 AM Medical Record Number: [id number] Patient Account Number: [id number] Date of Birth/Sex: [birth year] (39 y.o. M) Treating RN: [name] Primary Care Liseth Wann: Alma [name] Other Clinician: Referring Marena Witts: Alma [name] Treating Abhi Moccia/Extender: Melburn Hake, [name]: 161 Edema Assessment Assessed: [Left: Yes] [Right: No] Edema: [Left: Ye] [Right: s] Calf Left: Right: Point of Measurement: 33 cm From Medial Instep 46 cm cm Ankle Left: Right: Point of Measurement: 12 cm From Medial Instep 31 cm cm Vascular Assessment Pulses: Dorsalis Pedis Palpable: [Left:Yes] Posterior Tibial Palpable: [Left:Yes] Electronic Signature(s) Signed: 01/07/2020 8:18:01 AM By: [name], [name], RN, [name], [name], [name] Previous Signature: 01/06/2020 10:49:47 AM Version By: Darci Needle Entered By: [name] [name], RN, [name], [name] on 01/07/2020 08:18:01 [name] ([id number]) -------------------------------------------------------------------------------- Multi Wound Chart Details Patient Name: [name] Date of Service: 01/06/2020 8:00 AM Medical Record Number: [id number] Patient Account Number: [id number] Date of Birth/Sex: 04/12/1981 (39 y.o. M) Treating RN: [name] Primary Care Eamon Tantillo: Alma [name] Other Clinician: Referring Marieliz Strang: Alma [name] Treating Echo Allsbrook/Extender: Melburn Hake, [name]: 161 Vital Signs Height(in): 71 Pulse(bpm): 68 Weight(lbs): 338 Blood Pressure(mmHg): 137/90 Body Mass Index(BMI): 47 Temperature(F): 98.2 Respiratory Rate(breaths/min): 20 Photos: [N/A:N/A] Wound Location: Left, Lateral Lower Leg  N/A N/A Wounding Event: Gradually Appeared N/A N/A Primary Etiology: Pyoderma N/A N/A Comorbid History: Sleep Apnea, Hypertension, Colitis N/A N/A Date Acquired: 11/18/2015 N/A N/A Weeks of Treatment: 161 N/A N/A Wound Status: Open N/A N/A Measurements L x W x D (cm) 5.8x5x0.3 N/A N/A Area (cm) : 22.777 N/A N/A Volume (cm) : 6.833 N/A N/A % Reduction in Area: -364.00% N/A N/A % Reduction in Volume: -74.00% N/A N/A Classification: Full Thickness With Exposed N/A N/A Support Structures Exudate Amount: Large N/A N/A Exudate Type: Purulent N/A N/A Exudate Color: yellow, brown, green N/A N/A Wound Margin: Epibole N/A N/A Granulation Amount: Large (67-100%) N/A N/A Granulation Quality: Pink N/A N/A Necrotic Amount: Small (1-33%) N/A N/A Exposed Structures: Fat Layer (Subcutaneous Tissue) N/A N/A Exposed: Yes Muscle: Yes Fascia: No Tendon: No Joint: No Bone: No Epithelialization: None N/A N/A Treatment Notes Electronic Signature(s) Signed: 01/06/2020 6:39:29 PM By: [name], [name], RN, [name], [name], [name] Entered By: [name], [name], RN, [name], [name] on 01/06/2020 08:43:18 [name] ([id number]) -------------------------------------------------------------------------------- Multi-Disciplinary Care Plan Details Patient Name: [name] Date of Service: 01/06/2020 8:00 AM Medical Record Number: [id number] Patient Account Number:  [id number] Date of Birth/Sex: 05-21-1980 (39 y.o. M) Treating RN: [name] Primary Care Rilen Shukla: Alma [name] Other Clinician: Referring Sadee Osland: Alma [name] Treating Ressie Slevin/Extender: Melburn Hake, [name]: 161 Active Inactive Venous Leg Ulcer Nursing Diagnoses: Knowledge deficit related to disease process and management Potential for venous Insuffiency (use before diagnosis confirmed) Goals: Non-invasive venous studies are completed as ordered Date Initiated: 12/11/2016 Target Resolution Date: 03/13/2017 Goal Status: Active Patient will maintain optimal edema control Date Initiated: 12/11/2016 Target Resolution Date: 03/13/2017 Goal Status: Active Patient/caregiver will verbalize understanding of disease process and disease management Date Initiated: 12/11/2016 Target Resolution Date: 03/13/2017 Goal Status: Active Verify adequate tissue perfusion prior to therapeutic compression application Date Initiated: 12/11/2016 Target Resolution Date: 03/13/2017 Goal Status: Active Interventions: Assess peripheral edema status every visit. Compression as ordered Provide education on venous insufficiency Treatment Activities: Therapeutic compression applied : 12/11/2016 Notes: Wound/Skin Impairment Nursing Diagnoses: Impaired tissue integrity Knowledge deficit related to ulceration/compromised skin integrity Goals: Patient/caregiver will verbalize understanding of skin care regimen Date Initiated: 12/11/2016 Target Resolution Date: 03/13/2017 Goal Status: Active Ulcer/skin breakdown will have a volume reduction of 30% by week 4 Date Initiated: 12/11/2016 Target Resolution Date: 03/13/2017 Goal Status: Active Ulcer/skin breakdown will have a volume reduction of 50% by week 8 Date Initiated: 12/11/2016 Target Resolution Date: 03/13/2017 Goal Status: Active Ulcer/skin breakdown will have a volume reduction of 80% by week 12 Date Initiated: 12/11/2016 Target  Resolution Date: 03/13/2017 Goal Status: Active Ulcer/skin breakdown will heal within 14 weeks Date Initiated: 12/11/2016 Target Resolution Date: 03/13/2017 Goal Status: Active [name], [name] ([id number]) Interventions: Assess patient/caregiver ability to obtain necessary supplies Assess patient/caregiver ability to perform ulcer/skin care regimen upon admission and as needed Assess ulceration(s) every visit Provide education on ulcer and skin care Treatment Activities: Skin care regimen initiated : 12/11/2016 Topical wound management initiated : 12/11/2016 Notes: Electronic Signature(s) Signed: 01/06/2020 6:39:29 PM By: [name], [name], RN, [name], [name], [name] Entered By: [name], [name], RN, [name], [name] on 01/06/2020 08:40:26 [name] ([id number]) -------------------------------------------------------------------------------- Pain Assessment Details Patient Name: [name] Date of Service: 01/06/2020 8:00 AM Medical Record Number: [id number] Patient Account Number: [id number] Date of Birth/Sex: [birth year] (39 y.o. M) Treating RN: [name] Primary Care Bayden Gil: Alma [name] Other Clinician: Referring Vanna Sailer: Alma [name] Treating Marlen Koman/Extender: Melburn Hake, [name]: 161 Active Problems Location of Pain  Severity and Description of Pain Patient Has Paino No Site Locations With Dressing Change: No Pain Management and Medication Current Pain Management: Electronic Signature(s) Signed: 01/07/2020 8:22:21 AM By: [name], [name], RN, [name], [name], [name] Previous Signature: 01/06/2020 10:49:47 AM Version By: Darci Needle Entered By: [name] [name], RN, [name], [name] on 01/07/2020 08:17:43 [name], [name] ([id number]) -------------------------------------------------------------------------------- Patient/Caregiver Education Details Patient Name: [name] Date of Service: 01/06/2020 8:00 AM Medical Record Number: [id number] Patient Account Number: [id number] Date of  Birth/Gender: 15-Aug-1980 (39 y.o. M) Treating RN: [name] Primary Care Physician: Alma [name] Other Clinician: Referring Physician: Alma [name] Treating Physician/Extender: Sharalyn Ink in Treatment: 161 Education Assessment Education Provided To: Patient Education Topics Provided Venous: Handouts: Controlling Swelling with Compression Stockings Methods: Demonstration, Explain/Verbal Responses: State content correctly Wound/Skin Impairment: Handouts: Caring for Your Ulcer Methods: Demonstration, Explain/Verbal Responses: State content correctly Electronic Signature(s) Signed: 01/06/2020 6:39:29 PM By: [name], [name], RN, [name], [name], [name] Entered By: [name], [name], RN, [name], [name] on 01/06/2020 08:45:39 [name] ([id number]) -------------------------------------------------------------------------------- Wound Assessment Details Patient Name: [name] Date of Service: 01/06/2020 8:00 AM Medical Record Number: [id number] Patient Account Number: [id number] Date of Birth/Sex: 1981-01-12 (39 y.o. M) Treating RN: [name] Primary Care Dawan Farney: Alma [name] Other Clinician: Referring Davyn Elsasser: Alma [name] Treating Joylynn Defrancesco/Extender: Melburn Hake, [name]: 161 Wound Status Wound Number: 1 Primary Etiology: Pyoderma Wound Location: Left, Lateral Lower Leg Wound Status: Open Wounding Event: Gradually Appeared Comorbid History: Sleep Apnea, Hypertension, Colitis Date Acquired: 11/18/2015 Weeks Of Treatment: 161 Clustered Wound: No Photos Wound Measurements Length: (cm) 5.8 Width: (cm) 5 Depth: (cm) 0.3 Area: (cm) 22.777 Volume: (cm) 6.833 % Reduction in Area: -364% % Reduction in Volume: -74% Epithelialization: None Wound Description Classification: Full Thickness With Exposed Support Structures Wound Margin: Epibole Exudate Amount: Large Exudate Type: Purulent Exudate Color: yellow, brown, green Foul Odor After Cleansing:  No Slough/Fibrino Yes Wound Bed Granulation Amount: Large (67-100%) Exposed Structure Granulation Quality: Pink Fascia Exposed: No Necrotic Amount: Small (1-33%) Fat Layer (Subcutaneous Tissue) Exposed: Yes Necrotic Quality: Adherent Slough Tendon Exposed: No Muscle Exposed: Yes Necrosis of Muscle: No Joint Exposed: No Bone Exposed: No Treatment Notes Wound #1 (Left, Lateral Lower Leg) Notes gent oinTment on lateral leg TCA on medial with BFD [name], [name] ([id number]) Electronic Signature(s) Signed: 01/06/2020 6:39:29 PM By: [name], [name], RN, [name], [name], [name] Entered By: [name], [name], RN, [name], [name] on 01/06/2020 08:42:20 [name] ([id number]) -------------------------------------------------------------------------------- Amelia Details Patient Name: [name] Date of Service: 01/06/2020 8:00 AM Medical Record Number: [id number] Patient Account Number: [id number] Date of Birth/Sex: 16-Dec-1980 (39 y.o. M) Treating RN: [name] Primary Care Davan Hark: Alma [name] Other Clinician: Referring Chris Cripps: Alma [name] Treating Coulton Schlink/Extender: Melburn Hake, [name]: 161 Vital Signs Time Taken: 08:20 Temperature (F): 98.2 Height (in): 71 Pulse (bpm): 68 Weight (lbs): 338 Respiratory Rate (breaths/min): 20 Body Mass Index (BMI): 47.1 Blood Pressure (mmHg): 137/90 Reference Range: 80 - 120 mg / dl Electronic Signature(s) Signed: 01/07/2020 8:17:34 AM By: [name], [name], RN, [name], [name], [name] Previous Signature: 01/06/2020 10:49:47 AM Version By: Darci Needle Entered By: [name], [name], RN, [name], [name] on 01/07/2020 08:17:33

## 2020-01-12 ENCOUNTER — Ambulatory Visit: Admit: 2020-01-12 | Payer: PRIVATE HEALTH INSURANCE | Attending: Dermatology | Primary: Dermatology

## 2020-01-16 ENCOUNTER — Other Ambulatory Visit: Payer: Self-pay

## 2020-01-16 DIAGNOSIS — E119 Type 2 diabetes mellitus without complications: Secondary | ICD-10-CM

## 2020-01-17 NOTE — Telephone Encounter (Signed)
Renee Called pt has been out of meds for least a week.  She stated [facility] had not received this refill request  Please advise when called in  [phone]

## 2020-01-17 NOTE — Telephone Encounter (Signed)
Left message informing patient that [facility] has metformin rx on hold and will fill it for him.

## 2020-01-20 ENCOUNTER — Ambulatory Visit: Payer: 59 | Admitting: Physician Assistant

## 2020-01-27 ENCOUNTER — Ambulatory Visit: Payer: 59 | Admitting: Physician Assistant

## 2020-02-07 ENCOUNTER — Encounter: Payer: 59 | Attending: Physician Assistant | Admitting: Physician Assistant

## 2020-02-07 ENCOUNTER — Other Ambulatory Visit: Payer: Self-pay

## 2020-02-07 DIAGNOSIS — L97222 Non-pressure chronic ulcer of left calf with fat layer exposed: Secondary | ICD-10-CM | POA: Diagnosis present

## 2020-02-07 DIAGNOSIS — I872 Venous insufficiency (chronic) (peripheral): Secondary | ICD-10-CM | POA: Diagnosis not present

## 2020-02-07 DIAGNOSIS — L88 Pyoderma gangrenosum: Secondary | ICD-10-CM | POA: Diagnosis not present

## 2020-02-07 DIAGNOSIS — L97321 Non-pressure chronic ulcer of left ankle limited to breakdown of skin: Secondary | ICD-10-CM | POA: Diagnosis not present

## 2020-02-07 DIAGNOSIS — L03116 Cellulitis of left lower limb: Secondary | ICD-10-CM | POA: Diagnosis not present

## 2020-02-07 NOTE — Progress Notes (Addendum)
IVON, OELKERS ([id number]) Visit Report for 02/07/2020 Chief Complaint Document Details Patient Name: [name] Date of Service: 02/07/2020 8:00 AM Medical Record Number: [id number] Patient Account Number: [id number] Date of Birth/Sex: [birth year] (39 y.o. 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 M) Treating RN: [name] Primary Care Provider: [name] Other Clinician: Referring Provider: [name] Treating Provider/Extender: Melburn Hake, Anouk Critzer Weeks in Treatment: 165 History of Present Illness HPI Description: 12/04/16; 39 year old man who comes into the clinic today for review of a wound on the posterior left calf. He tells me that is been there for about a year. He is not a diabetic he does smoke half a pack per day. He was seen in the ER on 11/20/16 felt to have cellulitis around the wound and was given clindamycin. An x-ray did not show osteomyelitis. The patient initially tells me that he has a milk allergy that sets off a pruritic itching rash on his lower legs which she scratches incessantly and he thinks that's what may have set up the wound. He has been using various topical antibiotics and ointments without any effect. He works in a [employer] and is on his feet all day. He does not have a  prior history of wounds however he does have the rash on both lower legs the right arm and the ventral aspect of his left arm. These are excoriations and clearly have had scratching however there are of macular looking areas on both legs including a substantial larger area on the right leg. This does not have an underlying open area. There is no blistering. The patient tells me that 2 years ago in [place] in response to the rash on his legs he saw a dermatologist who told him he had a condition which may be pyoderma gangrenosum although I may be putting words into his mouth. He seemed to recognize this. 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 He is not in any treatment for this. He's had no recent travel 12/11/16; the patient arrives today with his wound and roughly the same condition we've been using silver alginate this is a deep punched out wound with some surrounding erythema but no tenderness. Biopsy I did did not show confirmed pyoderma gangrenosum suggested nonspecific inflammation and vasculitis but does not provide an actual description of what was seen by the pathologist. I'm really not able to understand this We have also received information from the patient's dermatologist in [place] notes from April 2016. This was a doctor Agarwal-antal. 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 He was prescribed topical steroid high potency under occlusion which helped but at this point the patient did not have a deep punched out wound. 12/18/16; the patient's wound is larger in terms of surface area however this surface looks better and there is less depth. The surrounding erythema also is better. The patient states that the wrap we put on came off 2 days ago when he has been using his  compression stockings. He we are in the process of getting a dermatology consult. 12/26/16 on evaluation today patient's left lower extremity wound shows evidence of  infection with surrounding erythema noted. He has been tolerating the dressing changes but states that he has noted more discomfort. There is a larger area of erythema surrounding the wound. No fevers, chills, nausea, or vomiting noted at this time. With that being said the wound still does have slough covering the surface. He is not allergic to any medication that he is aware of at this point. In regard to his right lower extremity he had several regions that are erythematous and pruritic he wonders if there's anything we can do to help that. 01/02/17 I reviewed patient's wound culture which was obtained his visit last week. He was placed on doxycycline at that point. Unfortunately that does not appear to be an antibiotic that would likely help with the situation however the pseudomonas noted on culture is sensitive to Cipro. Also unfortunately patient's wound seems to have a large compared to last week's evaluation. Not severely so but there are definitely increased measurements in general. He is continuing to have discomfort as well he writes this to be a seven out of 10. In fact he would prefer me not to perform any debridement today due to the fact that he is having discomfort and considering he has an active infection on the little reluctant to do so anyway. No fevers, chills, nausea, or vomiting noted at this time. 01/08/17; patient seems dermatology on September 5. I suspect dermatology will want the slides from the biopsy I did sent to their pathologist. I'm not sure if there is a way we can expedite that. In any case the culture I did before I left on vacation 3 weeks ago showed Pseudomonas he was given 10 days of Cipro and per her description of her intake nurses is actually somewhat better this week although the wound is quite a bit bigger than I remember the last time I saw this. He still has 3 more days of Cipro 01/21/17; dermatology appointment tomorrow. He has completed the ciprofloxacin  for Pseudomonas. Surface of the wound looks better however he is had some deterioration in the lesions on his right leg. Meantime the left lateral leg wound we will continue with sample 01/29/17; patient had his dermatology appointment but I can't yet see that note. He is completed his antibiotics. The wound is more superficial but considerably larger in circumferential area than when he came in. This is in his left lateral calf. He also has swollen erythematous areas with superficial wounds on the right leg and small papular areas on both arms. There apparently areas in her his upper thighs and buttocks I did not look at those. Dermatology biopsied the right leg. Hopefully will have their input next week. 02/05/17; patient went back to see his dermatologist who told him that he had a "scratching problem" as well as staph. He is now on a 30 day course of doxycycline and I believe she gave him triamcinolone cream to the right leg areas to help with the itching [not exactly sure but probably triamcinolone]. She apparently looked at the left lateral leg wound although this was not rebiopsied and I think felt to be ultimately part of the same pathogenesis. He is using sample border foam and changing nevus himself. He now has a new open area on the right posterior leg which was his biopsy site I don't have any of the  dermatology notes 02/12/17; we put the patient in compression last week with SANTYL to the wound on the left leg and the biopsy. Edema is much better and the depth of the wound is now at level of skin. Area is still the same oBiopsy site on the right lateral leg we've also been using santyl with a border foam dressing and he is changing this himself. 02/19/17; Using silver alginate started last week to both the substantial left leg wound and the biopsy site on the right wound. He is tolerating compression well. Has a an appointment with his primary M.D. tomorrow wondering about diuretics although  I'm wondering if the edema problem is actually lymphedema 02/26/17; the patient has been to see his primary doctor Dr. Jerrel Ivory at [place] our primary care. She started him on Lasix 20 mg and this seems to have helped with the edema. However we are not making substantial change with the left lateral calf wound and inflammation. The biopsy site on the right leg also looks stable but not really all that different. 03/12/17; the patient has been to see vein and vascular Dr. Lucky Cowboy. He has had venous reflux studies I have not reviewed these. I did get a call from his dermatology office. They felt that he might have pathergy based on their biopsy on his right leg which led them to look at the slides of WESTLEY, BLASS ([id number]) the biopsy I did on the left leg and they wonder whether this represents pyoderma gangrenosum which was the original supposition in a man with ulcerative colitis albeit inactive for many years. They therefore recommended clobetasol and tetracycline i.e. 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 03/26/17; apparently the patient just had reflux studies not an appointment with Dr. dew. She arrives in clinic today having applied clobetasol for 2-3 weeks. He notes over the last 2-3 days excessive drainage having to change the dressing 3-4 times a day and also expanding erythema. 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 Lillia Carmel at [facility] dermatology locally who kindly saw him at my request. 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 My concern was the degree of erythema medially extending from his left leg wound which was either inflammation from pyoderma  or cellulitis. I put him on Augmentin however culture of the wound showed Pseudomonas which is quinolone sensitive. I really don't believe he has cellulitis however in view of everything I will continue and give him a course of Cipro. He is also on doxycycline as an immune modulator for the pyoderma. In addition to his original wound on the left lateral leg with surrounding erythema he has a wound on the right posterior calf which was an original biopsy site done by dermatology. 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 Saw Dr. Lillia Carmel yesterday. He has been using topical antibiotics to both wound areas his original wound on the left and the biopsies/pathergy area on the right. There is definitely some improvement in the inflammation around the wound on the right although the patient states he has increasing sensitivity of the wounds. He is on prednisone 60 and doxycycline 1 as prescribed by Dr. Lillia Carmel. He is covering the topical antibiotic  with gauze and putting this in his own compression stocks and changing this daily. He states that Dr. Lottie Rater did a culture of the left leg wound yesterday 05/07/17; pyoderma gangrenosum. The patient saw Dr. Lillia Carmel yesterday and has a follow-up with her in one month. He is still using topical antibiotics to both wounds although he can't recall exactly what type. He is still on prednisone 60 mg. Dr. Lillia Carmel stated that the doxycycline could stop if we were in agreement. 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 The right medial leg was induced by biopsy/pathergy. The area on the right is essentially healed. 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 The right medial leg has closed and remains closed. He  is still on prednisone 60. oHe tells me he missed his last dermatology appointment with Dr. Lillia Carmel but will make another appointment. He reports that her blood sugar at a recent screen in [place] was high 200's. He was 180 today. 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 I think he is going to require still much longer prednisone perhaps another 3 months before attempting to taper. In the meantime his wound is a lot better. Smaller. He is cleaning this off daily and applying topical antibiotics. When he was last in the clinic I thought about changing to [facility] and actually put in a couple of calls to dermatology although probably not during their business hours. 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 He continues on oral prednisone. He has been using triple antibiotic ointment. There is surface debris and we will transition to [facility] and have him return in 2 weeks. He has lost 30 pounds since his last appointment with lifestyle modification. He may benefit from topical steroid cream for treatment this can be considered at a later date. 08/22/17 on evaluation today patient appears to actually be doing rather well in regard to his left lateral lower extremity ulcer. He has actually been managed by Dr. Dellia Nims most recently. Patient is currently on oral steroids at this time. This seems to have been of benefit for him. Nonetheless his last visit was actually with Leah on 08/06/17. Currently he is not utilizing any topical steroid creams although this could be of benefit as well. No fevers, chills, nausea, or vomiting noted at this time. 09/05/17 on evaluation today patient appears to be doing better in regard to his left lateral lower extremity ulcer. He has been tolerating the dressing changes without complication. He is using Santyl with good effect. Overall I'm  very pleased with how things are standing at this point. Patient likewise is happy that this is doing better. 09/19/17 on evaluation today patient actually appears to be doing rather well in regard to his left lateral lower extremity ulcer. 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 He's not having any significant pain. 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 He has been tolerating the Santyl without complication and in general I feel like he's making good progress. 10/17/17 on evaluation  today patient appears to be doing very well in regard to his left lateral lower surety ulcer. He has been tolerating the dressing changes without complication. There does not appear to be any evidence of infection he's alternating the Santyl and the triple antibiotic ointment every other day this seems to be doing well for him. 11/03/17 on evaluation today patient appears to be doing very well in regard to his left lateral lower extremity ulcer. He is been tolerating the dressing changes without complication which is good news. Fortunately there does not appear to be any evidence of infection which is also great news. Overall is doing excellent they are starting to taper down on the prednisone is down to 40 mg at this point it also started topical clobetasol for him. 11/17/17 on evaluation today patient appears to be doing well in regard to his left lateral lower surety ulcer. He's been tolerating the dressing changes without complication. He does note that he is having no pain, no excessive drainage or discharge, and overall he feels like things are going about how he would expect and hope they would. Overall he seems to have no evidence of infection at this time in my opinion which is good news. 12/04/17-He is seen in follow-up  evaluation for right lateral lower extremity ulcer. He has been applying topical steroid cream. Today's measurement show slight increase in size. Over the next 2 weeks we will transition to every other day Santyl and steroid cream. He has been encouraged to monitor for changes and notify clinic with any concerns 12/15/17 on evaluation today patient's left lateral motion the ulcer and fortunately is doing worse again at this point. This just since last week to this week has close to doubled in size according to the patient. I did not seeing last week's I do not have a visual to compare this to in our system was also down so we do not have all the charts and at this point. Nonetheless it does have me somewhat concerned in regard to the fact that again he was worried enough about it he has contact the dermatology that placed them back on the full strength, 50 mg a day of the prednisone that he was taken previous. He continues to alternate using clobetasol along with Santyl at this point. He is obviously somewhat frustrated. 12/22/17 on evaluation today patient appears to be doing a little worse compared to last evaluation. Unfortunately the wound is a little deeper and slightly larger than the last week's evaluation. With that being said he has made some progress in regard to the irritation surrounding at this time unfortunately despite that progress that's been made he still has a significant issue going on here. 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 KVON, MCILHENNY ([id number]) For that reason I discussed with him today the possibility of perform a wound culture to ensure there's nothing overtly infected. 01/06/18 on evaluation today patient's wound is larger and deeper than previously evaluated. With that being said it did appear that his wound was  infected after my last evaluation with him. Subsequently I did end up prescribing a prescription for Bactrim DS which she has been taking and having no complication with. Fortunately there does not appear to be any evidence of infection at this point in time as far as anything spreading, no want to touch, and overall I  feel like things are showing signs of improvement. 01/13/18 on evaluation today patient appears to be even a little larger and deeper than last time. There still muscle exposed in the base of the wound. Nonetheless he does appear to be less erythematous I do believe inflammation is calming down also believe the infection looks like it's probably resolved at this time based on what I'm seeing. No fevers, chills, nausea, or vomiting noted at this time. 01/30/18 on evaluation today patient actually appears to visually look better for the most part. Unfortunately those visually this looks better he does seem to potentially have what may be an abscess in the muscle that has been noted in the central portion of the wound. This is the first time that I have noted what appears to be fluctuance in the central portion of the muscle. With that being said I'm somewhat more concerned about the fact that this might indicate an abscess formation at this location. I do believe that an ultrasound would be appropriate. This is likely something we need to try to do as soon as possible. He has been switch to mupirocin ointment and he is no longer using the steroid ointment as prescribed by dermatology he sees them again next week he's been decreased from 60 to 40 mg of prednisone. 03/09/18 on evaluation today patient actually appears to be doing a little better compared to last time I saw him. There's not as much erythema surrounding the wound itself. He I did review his most recent infectious disease note which was dated 02/24/18. He saw Dr. Michel Bickers in [place]. 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 Patient is now off of antibiotics and they are continually observing this. There seems to be no change in the past two weeks in my pinion based on what the patient says and what I see today compared to what Dr. Megan Salon likely saw two weeks ago. No fevers, chills, nausea, or vomiting noted at this time. 03/23/18 on evaluation today patient's wound actually appears to be showing signs of improvement which is good news. He is currently still on the Dapsone. He is also working on tapering the prednisone to get off of this and Dr. Lottie Rater is working with him in this regard. Nonetheless overall I feel like the wound is doing well it does appear based on the infectious disease note that I reviewed from Dr. 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 I did also review the results of his ultrasound of left lower extremity which revealed there was a dentist tissue in the base of the wound without an abscess noted. 04/06/18 on evaluation today the patient's left lateral lower extremity ulcer actually appears to be doing fairly well which is excellent news. There does not appear to be any evidence of infection at this time which is also great news. Overall he still does have a significantly large ulceration although little by little he seems to be making progress. He is down to 10 mg a day of the prednisone. 04/20/18 on evaluation today patient actually appears to be doing excellent at this time in regard to his left lower extremity ulcer. He's making signs of good progress unfortunately this is taking much longer than we would really like to see but nonetheless  he is making progress. Fortunately there does not appear to be any evidence of infection at this time. No fevers, chills, nausea, or vomiting noted at  this time. The patient has not been using the Santyl due to the cost he hadn't got in this field yet. He's mainly been using the antibiotic ointment topically. Subsequently he also tells me that he really has not been scrubbing in the shower I think this would be helpful again as I told him it doesn't have to be anything too aggressive to even make it believe just enough to keep it free of some of the loose slough and biofilm on the wound surface. 05/11/18 on evaluation today patient's wound appears to be making slow but sure progress in regard to the left lateral lower extremity ulcer. He is been tolerating the dressing changes without complication. Fortunately there does not appear to be any evidence of infection at this time. He is still just using triple antibiotic ointment along with clobetasol occasionally over the area. He never got the Santyl and really does not seem to intend to in my pinion. 06/01/18 on evaluation today patient appears to be doing a little better in regard to his left lateral lower extremity ulcer. He states that overall he does not feel like he is doing as well with the Dapsone as he did with the prednisone. Nonetheless he sees his dermatologist later today and is gonna talk to them about the possibility of going back on the prednisone. Overall again I believe that the wound would be better if you would utilize Santyl but he really does not seem to be interested in going back to the [place] at this point. He has been using triple antibiotic ointment. 06/15/18 on evaluation today patient's wound actually appears to be doing about the same at this point. Fortunately there is no signs of infection at this time. He has made slight improvements although he continues to not really want to clean the wound bed at this point. He states that he just doesn't mess with it he doesn't want to cause any problems with everything else he has going on. He has been on medication, antibiotics  as prescribed by his dermatologist, for a staff infection of his lower extremities which is really drying out now and looking much better he tells me. Fortunately there is no sign of overall infection. 06/29/18 on evaluation today patient appears to be doing well in regard to his left lateral lower surety ulcer all things considering. Fortunately his staff infection seems to be greatly improved compared to previous. He has no signs of infection and this is drying up quite nicely. He is still the doxycycline for this is no longer on cental, Dapsone, or any of the other medications. His dermatologist has recommended possibility of an infusion but right now he does not want to proceed with that. 07/13/18 on evaluation today patient appears to be doing about the same in regard to his left lateral lower surety ulcer. Fortunately there's no signs of infection at this time which is great news. Unfortunately he still builds up a significant amount of Slough/biofilm of the surface of the wound he still is not really cleaning this as he should be appropriately. Again I'm able to easily with saline and gauze remove the majority of this on the surface which if you would do this at home would likely be a dramatic improvement for him as far as getting the area to improve. Nonetheless overall I still feel  like he is making progress is just very slow. I think Santyl will be of benefit for him as well. Still he has not gotten this as of this point. 07/27/18 on evaluation today patient actually appears to be doing little worse in regards of the erythema around the periwound region of the wound he also tells me that he's been having more drainage currently compared to what he was experiencing last time I saw him. He states not quite as bad as what he had because this was infected previously but nonetheless is still appears to be doing poorly. Fortunately there is no evidence of systemic infection at this point. The patient  tells me that he is not going to be able to afford the Santyl. He is still waiting to hear about the infusion therapy with his dermatologist. Apparently she wants an updated colonoscopy first. 08/10/18 on evaluation today patient appears to be doing better in regard to his left lateral lower extremity ulcer. Fortunately he is showing signs of improvement in this regard he's actually been approved for Remicade infusion's as well although this has not been scheduled as of yet. Fortunately there's no signs of active infection at this time in regard to the wound although he is having some issues with infection of the right lower extremity is been seen as dermatologist for this. Fortunately they are definitely still working with him trying to keep things under control. LEOCADIO, HEAL ([id number]) 09/07/18 on evaluation today patient is actually doing rather well in regard to his left lateral lower extremity ulcer. He notes these actually having some hair grow back on his extremity which is something he has not seen in years. He also tells me that the pain is really not giving them any trouble at this time which is also good news overall she is very pleased with the progress he's using a combination of the mupirocin along with the probate is all mixed. 09/21/18 on evaluation today patient actually appears to be doing fairly well all things considered in regard to his looks from the ulcer. He's been tolerating the dressing changes without complication. Fortunately there's no signs of active infection at this time which is good news he is still on all antibiotics or prevention of the staff infection. He has been on prednisone for time although he states it is gonna contact his dermatologist and see if she put them on a short course due to some irritation that he has going on currently. Fortunately there's no evidence of any overall worsening this is going very slow I think cental would be something that would be  helpful for him although he states that $50 for tube is quite expensive. He therefore is not willing to get that at this point. 10/06/18 on evaluation today patient actually appears to be doing decently well in regard to his left lateral leg ulcer. He's been tolerating the dressing changes without complication. Fortunately there's no signs of active infection at this time. Overall I'm actually rather pleased with the progress he's making although it's slow he doesn't show any signs of infection and he does seem to be making some improvement. I do believe that he may need a switch up and dressings to try to help this to heal more appropriately and quickly. 10/19/18 on evaluation today patient actually appears to be doing better in regard to his left lateral lower extremity ulcer. This is shown signs of having much less Slough buildup at this point due to the fact he has  been using the Santyl. Obviously this is very good news. The overall size of the wound is not dramatically smaller but again the appearance is. 11/02/18 on evaluation today patient actually appears to be doing quite well in regard to his lower [place] ulcer. A lot of the skin around the ulcer is actually somewhat irritating at this point this seems to be more due to the dressing causing irritation from the adhesive that anything else. Fortunately there is no signs of active infection at this time. 11/24/18 on evaluation today patient appears to be doing a little worse in regard to his overall appearance of his lower extremity ulcer. There's more erythema and warmth around the wound unfortunately. He is currently on doxycycline which he has been on for some time. With that being said I'm not sure that seems to be helping with what appears to possibly be an acute cellulitis with regard to his left lower extremity ulcer. No fevers, chills, nausea, or vomiting noted at this time. 12/08/18 on evaluation today patient's wounds actually appears to  be doing significantly better compared to his last evaluation. He has been using Santyl along with alternating tripling about appointment as well as the steroid cream seems to be doing quite well and the wound is showing signs of improvement which is excellent news. Fortunately there's no evidence of infection and in fact his culture came back negative with only normal skin flora noted. 12/21/2018 upon evaluation today patient actually appears to be doing excellent with regard to his ulcer. This is actually the best that I have seen it since have been helping to take care of him. It is both smaller as well as less slough noted on the surface of the wound and seems to be showing signs of good improvement with new skin growing from the edges. He has been using just the triamcinolone he does wonder if he can get a refill of that ointment today. 01/04/2019 upon evaluation today patient actually appears to be doing well with regard to his left lateral lower extremity ulcer. With that being said it does not appear to be that he is doing quite as well as last time as far as progression is concerned. There does not appear to be any signs of infection or significant irritation which is good news. With that being said I do believe that he may benefit from switching to a collagen based dressing based on how clean The wound appears. 01/18/2019 on evaluation today patient actually appears to be doing well with regard to his wound on the left lower extremity. He is not made a lot of progress compared to where we were previous but nonetheless does seem to be doing okay at this time which is good news. There is no signs of active infection which is also good news. My only concern currently is I do wish we can get him into utilizing the collagen dressing his insurance would not pay for the supplies that we ordered although it appears that he may be able to order this through his supply company that he typically utilizes.  This is [employer]. Nonetheless he did try to order it during the office visit today and it appears this did go through. We will see if he can get that it is a different brand but nonetheless he has collagen and I do think will be beneficial. 02/01/2019 on evaluation today patient actually appears to be doing a little worse today in regard to the overall size of his wounds.  Fortunately there is no signs of active infection at this time. That is visually. Nonetheless when this is happened before it was due to infection. For that reason were somewhat concerned about that this time as well. 02/08/2019 on evaluation today patient unfortunately appears to be doing slightly worse with regard to his wound upon evaluation today. Is measuring a little deeper and a little larger unfortunately. I am not really sure exactly what is causing this to enlarge he actually did see his dermatologist she is going to see about initiating Humira for him. Subsequently she also did do steroid injections into the wound itself in the periphery. Nonetheless still nonetheless he seems to be getting a little bit larger he is gone back to just using the steroid cream topically which I think is appropriate. I would say hold off on the collagen for the time being is definitely a good thing to do. Based on the culture results which we finally did get the final result back regarding it shows staph as the bacteria noted again that can be a normal skin bacteria based on the fact however he is having increased drainage and worsening of the wound measurement wise I would go ahead and place him on an antibiotic today I do believe for this. 02/15/2019 on evaluation today patient actually appears to be doing somewhat better in regard to his ulcer. There is no signs of worsening at this time I did review his culture results which showed evidence of Staphylococcus aureus but not MRSA. Again this could just be more related to the normal skin  bacteria although he states the drainage has slowed down quite a bit he may have had a mild infection not just colonization. And was much smaller and then since around10/04/2019 on evaluation today patient appears to be doing unfortunately worse as far as the size of the wound. I really feel like that this is steadily getting larger again it had been doing excellent right at the beginning of September we have seen a steady increase in the area of the wound it is almost 2-1/2 times the size it was on September 1. Obviously this is a bad trend this is not wanting to see. For that reason we went back to using just the topical triamcinolone cream which does seem to help with inflammation. I checked him for bacteria by way of culture and nothing showed positive there. I am considering giving him a short course of a tapering steroid Dosepak today to see if that is can be beneficial for him. The patient is in agreement with giving that a try. 03/08/2019 on evaluation today patient appears to be doing very well in comparison to last evaluation with regard to his lower extremity ulcer. This is showing signs of less inflammation and actually measuring slightly smaller compared to last time every other week over the past month and a half he has been measuring larger larger larger. Nonetheless I do believe that the issue has been inflammation the prednisone does seem to [facility] [place] ([id number]) have been beneficial for him which is good news. No fevers, chills, nausea, vomiting, or diarrhea. 03/22/2019 on evaluation today patient appears to be doing about the same with regard to his leg ulcer. He has been tolerating the dressing changes without complication. With that being said the wound seems to be mostly arrested at its current size but really is not making any progress except for when we prescribed the prednisone. He did show some signs of dropping as  far as the overall size of the wound during that interval  week. Nonetheless this is something he is not on long-term at this point and unfortunately I think he is getting need either this or else the Humira which his dermatologist has discussed try to get approval for. With that being said he will be seeing his dermatologist on the 11th of this month that is November. 04/19/2019 on evaluation today patient appears to be doing really about the same the wound is measuring slightly larger compared to last time I saw him. He has not been into the office since November 2 due to the fact that he unfortunately had Covid as that his entire family. He tells me that it was rough but they did pull-through and he seems to be doing much better. Fortunately there is no signs of active infection at this time. No fevers, chills, nausea, vomiting, or diarrhea. 05/10/2019 on evaluation today patient unfortunately appears to be doing significantly worse as compared to last time I saw him. He does tell me that he has had his first dose of Humira and actually is scheduled to get the next one in the upcoming week. With that being said he tells me also that in the past several days he has been having a lot of issues with green drainage she showed me a picture this is more blue-green in color. He is also been having issues with increased sloughy buildup and the wound does appear to be larger today. Obviously this is not the direction that we want everything to take based on the starting of his Humira. Nonetheless I think this is definitely a result of likely infection and to be honest I think this is probably Pseudomonas causing the infection based on what I am seeing. 05/24/2019 on evaluation today patient unfortunately appears to be doing significantly worse compared to his prior evaluation with me 2 weeks ago. I did review his culture results which showed that he does have Staph aureus as well as Pseudomonas noted on the culture. Nonetheless the Levaquin that I prescribed for him  does not appear to have been appropriate and in fact he tells me he is no longer experiencing the green drainage and discharge that he had at the last visit. Fortunately there is no signs of active infection at this time which is good news although the wound has significantly worsened it in fact is much deeper than it was previous. We have been utilizing up to this point triamcinolone ointment as the prescription topical of choice but at this time I really feel like that the wound is getting need to be packed in order to appropriately manage this due to the deeper nature of the wound. Therefore something along the lines of an alginate dressing may be more appropriate. 05/31/2019 upon inspection today patient's wound actually showed signs of doing poorly at this point. Unfortunately he just does not seem to be making any good progress despite what we have tried. He actually did go ahead and pick up the Cipro and start taking that as he was noticing more green drainage he had previously completed the Levaquin that I prescribed for him as well. Nonetheless he missed his appointment for the seventh last week on Wednesday with the wound care center and [facility] where his dermatologist referred him. Obviously I do think a second opinion would be helpful at this point especially in light of the fact that the patient seems to be doing so poorly despite  the fact that we have tried everything that I really know how at this point. The only thing that ever seems to have helped him in the past is when he was on high doses of continual steroids that did seem to make a difference for him. 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 06/07/2019 upon evaluation today patient unfortunately appears to be doing worse yet again with regard to his wound. In fact I  am starting to question whether or not he may have a fluid pocket in the muscle at this point based on the bulging and the soft appearance to the central portion of the muscle area. There is not anything draining from the muscle itself at this time which is good news but nonetheless the wound is expanding. I am not really seeing any results of the Humira as far as overall wound progression based on what I am seeing at this point. The patient has been referred for second opinion with regard to his wound to the [facility] wound care center by his dermatologist which I definitely am not in opposition to. Unfortunately we tried multiple dressings in the past including collagen, alginate, and at one point even Hydrofera Blue. With that being said he is never really used it for any significant amount of time due to the fact that he often complains of pain associated with these dressings and then will go back to either using the Santyl which she has done intermittently or more frequently the triamcinolone. He is also using his own compression stockings. We have wrapped him in the past but again that was something else that he really was not a big fan of. 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 He seemed to do much better when he was on the steroids although honestly I understand that the steroids are not necessarily the best medication to be on long-term obviously 06/14/2019 on evaluation today patient appears to be doing actually a little bit better with regard to the overall appearance with his leg. Unfortunately he does continue to have issues with what appears to be some fluid underneath  the muscle although he did see the wound specialty center at [facility] last week their main goals were to see about infusion therapy in place of the Humira as they feel like that is not quite strong enough. They also recommended that we continue with the treatment otherwise as we are they felt like that was appropriate and they are okay with him continuing to follow-up here with [place] in that regard. With that being said they are also sending him to the vein specialist there to see about vein stripping and if that would be of benefit for him. Subsequently they also did not really address whether or not an ultrasound of the muscle area to see if there is anything that needs to be addressed here would be appropriate or not. For that reason I discussed this with him last week I  think we may proceed down that road at this point. 06/21/2019 upon evaluation today patient's wound actually appears to be doing slightly better compared to previous evaluations. I do believe that he has made a difference with regard to the progression here with the use of oral steroids. Again in the past has been the only thing that is really calm things down. He does tell me that from [facility] is gotten a good news from there that there are no further vein stripping that is necessary at this point. I do not have that available for review today although the patient did relay this to me. He also did obtain and have the ultrasound of the wound completed which I did sign off on today. It does appear that there is no fluid collection under the muscle this is likely then just edematous tissue in general. That is also good news. Overall I still believe the inflammation is the main issue here. He did inquire about the possibility of a wound VAC again with the muscle protruding like it is I am not really sure whether the wound VAC is necessarily ideal or not. That is something we will have to consider although I do believe he may need compression  wrapping to try to help with edema control which could potentially be of benefit. 06/28/2019 on evaluation today patient appears to be doing slightly better measurement wise although this is not terribly smaller he least seems to be trending towards that direction. With that being said he still seems to have purulent drainage noted in the wound bed at this time. He has been on Levaquin followed by Cipro over the past month. Unfortunately he still seems to have some issues with active infection at this time. I did perform a culture last week in order to evaluate and see if indeed there was still anything going on. Subsequently the culture did come back showing Pseudomonas which is consistent with the drainage has been having which is blue-green in color. He also has had an odor that again was somewhat consistent with Pseudomonas as well. Long story short it appears that the culture showed an intermediate finding with regard to how well the Cipro will work for the Pseudomonas infection. Subsequently being that he does not seem to be clearing up and at best what we are doing is just keeping this at [place] I think he may need to see infectious disease to discuss IV antibiotic options. JOTHAM, AHN ([id number]) 07/05/2019 upon evaluation today patient appears to be doing okay in regard to his leg ulcer. He has been tolerating the dressing changes at this point without complication. Fortunately there is no signs of active infection at this time which is good news. No fevers, chills, nausea, vomiting, or diarrhea. With that being said he does have an appointment with infectious disease tomorrow and his primary care on Wednesday. Again the reason for the infectious disease referral was due to the fact that he did not seem to be fully resolving with the use of oral antibiotics and therefore we were thinking that IV antibiotic therapy may be necessary secondary to the fact that there was an intermediate finding for  how effective the Cipro may be. Nonetheless again he has been having a lot of purulent and even green drainage. Fortunately right now that seems to have calmed down over the past week with the reinitiation of the oral antibiotic. Nonetheless we will see what Dr. Megan Salon has to say. 07/12/2019 upon evaluation today patient appears  to be doing about the same at this point in regard to his left lower extremity ulcer. Fortunately there is no signs of active infection at this time which is good news I do believe the Levaquin has been beneficial I did review Dr. Hale Bogus note and to be honest I agree that the patient's leg does appear to be doing better currently. What we found in the past as he does not seem to really completely resolve he will stop the antibiotic and then subsequently things will revert back to having issues with blue-green drainage, increased pain, and overall worsening in general. Obviously that is the reason I sent him back to infectious disease. 07/19/2019 upon evaluation today patient appears to be doing roughly the same in size there is really no dramatic improvement. He has started back on the Levaquin at this point and though he seems to be doing okay he did still have a lot of blue/green drainage noted on evaluation today unfortunately. I think that this is still indicative more likely of a Pseudomonas infection as previously noted and again he does see Dr. Megan Salon in just a couple of days. I do not know that were really able to effectively clear this with just oral antibiotics alone based on what I am seeing currently. Nonetheless we are still continue to try to manage as best we can with regard to the patient and his wound. I do think the wrap was helpful in decreasing the edema which is excellent news. No fevers, chills, nausea, vomiting, or diarrhea. 07/26/2019 upon evaluation today patient appears to be doing slightly better with regard to the overall appearance of the muscle  there is no dark discoloration centrally. Fortunately there is no signs of active infection at this time. No fevers, chills, nausea, vomiting, or diarrhea. Patient's wound bed currently the patient did have an appointment with Dr. Megan Salon at infectious disease last week. With that being said Dr. Megan Salon the patient states was still somewhat hesitant about put him on any IV antibiotics he wanted [place] to repeat cultures today and then see where things go going forward. He does look like Dr. Megan Salon because of some improvement the patient did have with the Levaquin wanted [place] to see about repeating cultures. If it indeed grows the Pseudomonas again then he recommended a possibility of considering a PICC line placement and IV antibiotic therapy. He plans to see the patient back in 1 to 2 weeks. 08/02/2019 upon evaluation today patient appears to be doing poorly with regard to his left lower extremity. We did get the results of his culture back it shows that he is still showing evidence of Pseudomonas which is consistent with the purulent/blue-green drainage that he has currently. Subsequently the culture also shows that he now is showing resistance to the oral fluoroquinolones which is unfortunate as that was really the only thing to treat the infection prior. I do believe that he is looking like this is going require IV antibiotic therapy to get this under control. Fortunately there is no signs of systemic infection at this time which is good news. The patient does see Dr. Megan Salon tomorrow. 08/09/2019 upon evaluation today patient appears to be doing better with regard to his left lower extremity ulcer in regard to the overall appearance. He is currently on IV antibiotic therapy. As ordered by Dr. Megan Salon. Currently the patient is on ceftazidime which she is going to take for the next 2 weeks and then follow-up for 4 to 5-week appointment with  Dr. Megan Salon. The patient started this this past Friday  symptoms have not for a total of 3 days currently in full. 08/16/2019 upon evaluation today patient's wound actually does show muscle in the base of the wound but in general does appear to be much better as far as the overall evidence of infection is concerned. In fact I feel like this is for the most part cleared up he still on the IV antibiotics he has not completed the full course yet but I think he is doing much better which is excellent news. 08/23/2019 upon evaluation today patient appears to be doing about the same with regard to his wound at this point. He tells me that he still has pain unfortunately. Fortunately there is no evidence of systemic infection at this time which is great news. There is significant muscle protrusion. 09/13/19 upon evaluation today patient appears to be doing about the same in regard to his leg unfortunately. He still has a lot of drainage coming from the ulceration there is still muscle exposed. With that being said the patient's last wound culture still showed an intermediate finding with regard to the Pseudomonas he still having the bluish/green drainage as well. Overall I do not know that the wound has completely cleared of infection at this point. Fortunately there is no signs of active infection systemically at this point which is good news. 09/20/2019 upon evaluation today patient's wound actually appears to be doing about the same based on what I am seeing currently. I do not see any signs of systemic infection he still does have evidence of some local infection and drainage. He did see Dr. Megan Salon last week and Dr. Megan Salon states that he probably does need a different IV antibiotic although he does not want to put him on this until the patient begins the Remicade infusion which is actually scheduled for about 10 days out from today on 13 May. Following that time Dr. Megan Salon is good to see him back and then will evaluate the feasibility of starting him on the  IV antibiotic therapy once again at that point. I do not disagree with this plan I do believe as Dr. Megan Salon stated in his note that I reviewed today that the patient's issue is multifactorial with the pyoderma being 1 aspect of this that were hoping the Remicade will be helpful for her. In the meantime I think the gentamicin is, helping to keep things under decent okay control in regard to the ulcer. 09/27/2019 upon evaluation today patient appears to be doing about the same with regard to his wound still there is a lot of muscle exposure though he does have some hyper granulation tissue noted around the edge and actually some granulation tissue starting to form over the muscle which is actually good news. Fortunately there is no evidence of active infection which is also good news. His pain is less at this point. 5/21; this is a patient I have not seen in a long time. 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 He has a large wound on the left lateral leg with protruding muscle. He comes in the clinic today showing the same area on his left medial ankle. He says there is been a spot there for some time although we have not previously defined this. Today he has a clearly defined area with slight amount of skin breakdown surrounded by raised areas with a purplish hue in color. This is not painful he says it  is irritated. 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 He still has muscle protruding from the lateral portion of his left leg but fortunately the new area that they were concerned about at his last visit does not appear to have opened at this point. He is currently on Remicade infusions and seems to be doing better in my opinion in fact the wound itself seems to be overall much better. The purplish discoloration that he did have seems to have resolved  and I think that is a good sign that hopefully the Remicade is doing its job. He does have some biofilm noted over the surface of the wound. 11/01/2019 on evaluation today patient's wound actually appears to be doing excellent at this time. Fortunately there is no evidence of active infection and overall I feel like he is making great progress. The Remicade seems to be due excellent job in my opinion. JALENE, LACKO ([id number]) 11/08/19 evaluation today vision actually appears to be doing quite well with regard to his weight ulcer. He's been tolerating dressing changes without complication. Fortunately there is no evidence of infection. No fevers, chills, nausea, or vomiting noted at this time. Overall states that is having more itching than pain which is actually a good sign in my opinion. 12/13/2019 upon evaluation today patient appears to be doing well today with regard to his wound. He has been tolerating the dressing changes without complication. Fortunately there is no sign of active infection at this time. No fevers, chills, nausea, vomiting, or diarrhea. Overall I feel like the infusion therapy has been very beneficial for him. 01/06/2020 on evaluation today patient appears to be doing well with regard to his wound. This is measuring smaller and actually looks to be doing better. Fortunately there is no signs of active infection at this point. No fevers, chills, nausea, vomiting, or diarrhea. With that being said he does still have the blue-green drainage but this does not seem to be causing any significant issues currently. He has been using the gentamicin that does seem to be keeping things under decent control at this point. He goes later this morning for his next infusion therapy for the pyoderma which seems to also be very beneficial. 02/07/2020 on evaluation today patient appears to be doing about the same in regard to his wounds currently. Fortunately there is no signs of active infection  systemically he does still have evidence of local infection still using gentamicin. He also is showing some signs of improvement albeit slowly I do feel like we are making some progress here. Electronic Signature(s) Signed: 02/07/2020 8:52:12 AM By: Worthy Keeler PA-C Entered By: Worthy Keeler on 02/07/2020 08:52:11 BALIAN, SCHALLER ([id number]) -------------------------------------------------------------------------------- Physical Exam Details Patient Name: [name] Date of Service: 02/07/2020 8:00 AM Medical Record Number: [id number] Patient Account Number: [id number] Date of Birth/Sex: 06-04-1980 (39 y.o. M) Treating RN: [name] Primary Care Provider: [name] Other Clinician: Referring Provider: [name] Treating Provider/Extender: Melburn Hake, Monnie Gudgel Weeks in Treatment: 31 Constitutional Well-nourished and well-hydrated in no acute distress. Respiratory normal breathing without difficulty. Psychiatric this patient is able to make decisions and demonstrates good insight into disease process. Alert and Oriented x 3. pleasant and cooperative. Notes Upon inspection patient's wound bed still showed some bright yellow/green drainage he tells me he still has some blue coloration on some of his dressings at times. Again he previously was on IV antibiotics with  Dr. Megan Salon for Pseudomonas again there is been a question as to whether or not this has completely resolved but the gentamicin seems to be keeping this under some good control. Electronic Signature(s) Signed: 02/07/2020 8:52:35 AM By: Worthy Keeler PA-C Entered By: Worthy Keeler on 02/07/2020 08:52:35 FRISCO, CORDTS ([id number]) -------------------------------------------------------------------------------- Physician Orders Details Patient Name: [name] Date of Service: 02/07/2020 8:00 AM Medical Record Number: [id number] Patient Account Number: [id number] Date of Birth/Sex: 04/11/1981 (39 y.o.  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 Anesthetic (add to Medication List) Wound #1 Left,Lateral Lower Leg o Topical Lidocaine 4% cream applied to wound bed prior to debridement (In Clinic Only). Primary Wound Dressing o Gentamicin Sulfate Cream - mix 1:1 with triamcinolone to apply to the wound bed Secondary Dressing Wound #1 Left,Lateral Lower Leg o Boardered Foam Dressing Dressing Change Frequency Wound #1 Left,Lateral Lower Leg o Change dressing every day. Follow-up Appointments Wound #1 Left,Lateral Lower Leg o Return Appointment in 2 weeks. Edema Control Wound #1 Left,Lateral Lower Leg o Patient to wear own compression stockings o Elevate legs to the level of the heart and pump ankles as often as possible Electronic Signature(s) Signed: 02/07/2020 4:30:36 PM By: Worthy Keeler PA-C Signed: 02/07/2020 4:33:41 PM By: Grover Canavan Entered By: Grover Canavan on 02/07/2020 08:50:41 Haberman, Ishmeal ([id number]) -------------------------------------------------------------------------------- Problem List Details Patient Name: [name] Date of Service: 02/07/2020 8:00 AM Medical Record Number: [id number] Patient Account Number: [id number] Date of Birth/Sex: 12-20-1980 (39 y.o. M) Treating RN: [name] Primary Care Provider: [name] Other Clinician: Referring Provider: [name] Treating Provider/Extender: Melburn Hake, Sierra Bissonette Weeks in Treatment: 165 Active Problems ICD-10 Encounter Code Description Active Date MDM Diagnosis L97.222 Non-pressure chronic ulcer of left calf with fat layer exposed 12/04/2016 No Yes L88 Pyoderma gangrenosum 03/26/2017 No Yes L97.321 Non-pressure chronic ulcer of left ankle limited to breakdown of skin 10/08/2019 No Yes I87.2 Venous insufficiency (chronic) (peripheral) 12/04/2016 No Yes L03.116 Cellulitis of left lower limb 05/24/2019 No Yes Inactive Problems ICD-10 Code Description Active Date Inactive Date L97.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 M) Treating RN: [name] Primary Care Provider: [name] Other Clinician: Referring Provider: [name] Treating Provider/Extender: Melburn Hake, Dayana Dalporto Weeks in Treatment: 165 Subjective Chief Complaint Information obtained from Patient He is here in follow up evaluation for LLE pyoderma ulcer History of Present Illness (HPI) 12/04/16; 39 year old man who comes into the clinic today for review of a wound on the posterior left calf. He tells me that is been there for about a year. He is not a diabetic he does smoke half a pack per day. He was seen in the ER on 11/20/16 felt to have cellulitis around  the wound and was given clindamycin. An x-ray did not show osteomyelitis. The patient initially tells me that he has a milk allergy that sets off a pruritic itching rash on his lower  legs which she scratches incessantly and he thinks that's what may have set up the wound. He has been using various topical antibiotics and ointments without any effect. He works in a [employer] and is on his feet all day. He does not have a prior history of wounds however he does have the rash on both lower legs the right arm and the ventral aspect of his left arm. These are excoriations and clearly have had scratching however there are of macular looking areas on both legs including a substantial larger area on the right leg. This does not have an underlying open area. There is no blistering. The patient tells me that 2 years ago in [place] in response to the rash on his legs he saw a dermatologist who told him he had a condition which may be pyoderma gangrenosum although I may be putting words into his mouth. He seemed to recognize this. 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 He is not in any treatment for this. He's had no recent travel 12/11/16; the patient arrives today with his wound and roughly the same condition we've been using silver alginate this is a deep punched out wound with some surrounding erythema but no tenderness. Biopsy I did did not show confirmed pyoderma gangrenosum suggested nonspecific inflammation and vasculitis but does not provide an actual description of what was seen by the pathologist. I'm really not able to understand this We have also received information from the patient's dermatologist in [place] notes from April 2016. This was a doctor Agarwal-antal. 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 He was prescribed topical steroid high potency under occlusion which helped but at this point the patient did not have a deep punched out wound. 12/18/16; the patient's wound is larger in terms of surface area however this surface looks better and there is less depth. The surrounding erythema also is better. The patient  states that the wrap we put on came off 2 days ago when he has been using his compression stockings. He we are in the process of getting a dermatology consult. 12/26/16 on evaluation today patient's left lower extremity wound shows evidence of infection with surrounding erythema noted. He has been tolerating the dressing changes but states that he has noted more discomfort. There is a larger area of erythema surrounding the wound. No fevers, chills, nausea, or vomiting noted at this time. With that being said the wound still does have slough covering the surface. He is not allergic to any medication that he is aware of at this point. In regard to his right lower extremity he had several regions that are erythematous and pruritic he wonders if there's anything we can do to help that. 01/02/17 I reviewed patient's wound culture which was obtained his visit last week. He was placed on doxycycline at that point. Unfortunately that does not appear to be an antibiotic that would likely help with the situation however the pseudomonas noted on culture is sensitive to Cipro. Also unfortunately patient's wound seems to have a large compared to last week's evaluation. Not severely so but there are definitely increased measurements in general. He is continuing to have discomfort as well he writes this to be a seven out of 10. In fact  he would prefer me not to perform any debridement today due to the fact that he is having discomfort and considering he has an active infection on the little reluctant to do so anyway. No fevers, chills, nausea, or vomiting noted at this time. 01/08/17; patient seems dermatology on September 5. I suspect dermatology will want the slides from the biopsy I did sent to their pathologist. I'm not sure if there is a way we can expedite that. In any case the culture I did before I left on vacation 3 weeks ago showed Pseudomonas he was given 10 days of Cipro and per her description of her  intake nurses is actually somewhat better this week although the wound is quite a bit bigger than I remember the last time I saw this. He still has 3 more days of Cipro 01/21/17; dermatology appointment tomorrow. He has completed the ciprofloxacin for Pseudomonas. Surface of the wound looks better however he is had some deterioration in the lesions on his right leg. Meantime the left lateral leg wound we will continue with sample 01/29/17; patient had his dermatology appointment but I can't yet see that note. He is completed his antibiotics. The wound is more superficial but considerably larger in circumferential area than when he came in. This is in his left lateral calf. He also has swollen erythematous areas with superficial wounds on the right leg and small papular areas on both arms. There apparently areas in her his upper thighs and buttocks I did not look at those. Dermatology biopsied the right leg. Hopefully will have their input next week. 02/05/17; patient went back to see his dermatologist who told him that he had a "scratching problem" as well as staph. He is now on a 30 day course of doxycycline and I believe she gave him triamcinolone cream to the right leg areas to help with the itching [not exactly sure but probably triamcinolone]. She apparently looked at the left lateral leg wound although this was not rebiopsied and I think felt to be ultimately part of the same pathogenesis. He is using sample border foam and changing nevus himself. He now has a new open area on the right posterior leg which was his biopsy site I don't have any of the dermatology notes 02/12/17; we put the patient in compression last week with SANTYL to the wound on the left leg and the biopsy. Edema is much better and the depth of the wound is now at level of skin. Area is still the same Biopsy site on the right lateral leg we've also been using santyl with a border foam dressing and he is changing this  himself. 02/19/17; Using silver alginate started last week to both the substantial left leg wound and the biopsy site on the right wound. He is tolerating compression well. Has a an appointment with his primary M.D. tomorrow wondering about diuretics although I'm wondering if the edema problem is actually lymphedema AJ, CRUNKLETON ([id number]) 02/26/17; the patient has been to see his primary doctor Dr. Jerrel Ivory at [facility] our primary care. She started him on Lasix 20 mg and this seems to have helped with the edema. However we are not making substantial change with the left lateral calf wound and inflammation. The biopsy site on the right leg also looks stable but not really all that different. 03/12/17; the patient has been to see vein and vascular Dr. Lucky Cowboy. He has had venous reflux studies I have not reviewed these. I did get a call  from his dermatology office. 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 They therefore recommended clobetasol and tetracycline i.e. 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 03/26/17; apparently the patient just had reflux studies not an appointment with Dr. dew. She arrives in clinic today having applied clobetasol for 2-3 weeks. He notes over the last 2-3 days excessive drainage having to change the dressing 3-4 times a day and also expanding erythema. 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 Lillia Carmel at [facility] dermatology locally who  kindly saw him at my request. 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 My concern was the degree of erythema medially extending from his left leg wound which was either inflammation from pyoderma or cellulitis. I put him on Augmentin however culture of the wound showed Pseudomonas which is quinolone sensitive. I really don't believe he has cellulitis however in view of everything I will continue and give him a course of Cipro. He is also on doxycycline as an immune modulator for the pyoderma. In addition to his original wound on the left lateral leg with surrounding erythema he has a wound on the right posterior calf which was an original biopsy site done by dermatology. 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 Saw Dr. Lillia Carmel yesterday. He has been using topical antibiotics to both wound areas his original wound on the left and the biopsies/pathergy area on the right. There is definitely some improvement in the inflammation around the wound on the right although the patient states he has increasing sensitivity of the wounds. He is on prednisone 60 and doxycycline 1 as prescribed by Dr. Lillia Carmel. He is covering the topical antibiotic with gauze and putting this in his own compression stocks and changing this daily. He states that Dr. Lottie Rater did a culture of the left leg wound yesterday 05/07/17; pyoderma gangrenosum. The patient saw Dr. Lillia Carmel yesterday and has a follow-up with her in one month. He is still using topical antibiotics to both wounds although he can't recall exactly what type. He is still on prednisone 60 mg. Dr. Lillia Carmel stated that the doxycycline could stop if we were in agreement. 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 The right medial leg was  induced by biopsy/pathergy. The area on the right is essentially healed. 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 The right medial leg has closed and remains closed. He is still on prednisone 60. He tells me he missed his last dermatology appointment with Dr. Lillia Carmel but will make another appointment. He reports that her blood sugar at a recent screen in [place] was high 200's. He was 180 today. 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 I think he is going to require still much  longer prednisone perhaps another 3 months before attempting to taper. In the meantime his wound is a lot better. Smaller. He is cleaning this off daily and applying topical antibiotics. When he was last in the clinic I thought about changing to [facility] and actually put in a couple of calls to dermatology although probably not during their business hours. 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 He continues on oral prednisone. He has been using triple antibiotic ointment. There is surface debris and we will transition to [facility] and have him return in 2 weeks. He has lost 30 pounds since his last appointment with lifestyle modification. He may benefit from topical steroid cream for treatment this can be considered at a later date. 08/22/17 on evaluation today patient appears to actually be doing rather well in regard to his left lateral lower extremity ulcer. He has actually been managed by Dr. Dellia Nims most recently. Patient is currently on oral steroids at this time. This seems to have been of benefit for him. Nonetheless his last visit was actually with Leah on 08/06/17. Currently he is not utilizing any topical steroid creams although this could be of benefit as well. No fevers, chills, nausea,  or vomiting noted at this time. 09/05/17 on evaluation today patient appears to be doing better in regard to his left lateral lower extremity ulcer. He has been tolerating the dressing changes without complication. He is using Santyl with good effect. Overall I'm very pleased with how things are standing at this point. Patient likewise is happy that this is doing better. 09/19/17 on evaluation today patient actually appears to be doing rather well in regard to his left lateral lower extremity ulcer. 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 He's not having any significant pain. 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 He has been tolerating the Santyl without complication and in general I feel like he's making good progress. 10/17/17 on evaluation today patient appears to be doing very well in regard to his left lateral lower surety ulcer. He has been tolerating the dressing changes without complication. There does not appear to be any evidence of infection he's alternating the Santyl and the triple antibiotic ointment every other day this seems to be doing well for him. 11/03/17 on evaluation today patient appears to be doing very well in regard to his left lateral lower extremity ulcer. He is been tolerating the dressing changes without complication which is good news. Fortunately there does not appear to be any evidence of infection which is also great news. Overall is doing excellent they are starting to taper down on the prednisone is down to 40 mg at this point it also started topical clobetasol for him. 11/17/17 on evaluation today patient appears to be doing well in regard to his left lateral lower surety ulcer. He's been tolerating the dressing changes without complication. He does note that he is having no  pain, no excessive drainage or discharge, and overall he feels like things are going about how he would expect and hope they would. Overall he seems to have no evidence of infection at this time in my opinion which is good news. 12/04/17-He is  seen in follow-up evaluation for right lateral lower extremity ulcer. He has been applying topical steroid cream. Today's measurement show slight increase in size. Over the next 2 weeks we will transition to every other day Santyl and steroid cream. He has been encouraged to monitor for changes and notify clinic with any concerns 12/15/17 on evaluation today patient's left lateral motion the ulcer and fortunately is doing worse again at this point. This just since last week to this week has close to doubled in size according to the patient. I did not seeing last week's I do not have a visual to compare this to in our system was also down so we do not have all the charts and at this point. Nonetheless it does have me somewhat concerned in regard to the fact that again he was worried enough about it he has contact the dermatology that placed them back on the full strength, 50 mg a day of the prednisone that he was taken previous. He continues to alternate using clobetasol along with Santyl at this point. He is obviously somewhat frustrated. KAYEN, GRABEL ([id number]) 12/22/17 on evaluation today patient appears to be doing a little worse compared to last evaluation. Unfortunately the wound is a little deeper and slightly larger than the last week's evaluation. With that being said he has made some progress in regard to the irritation surrounding at this time unfortunately despite that progress that's been made he still has a significant issue going on here. 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 For that reason I  discussed with him today the possibility of perform a wound culture to ensure there's nothing overtly infected. 01/06/18 on evaluation today patient's wound is larger and deeper than previously evaluated. With that being said it did appear that his wound was infected after my last evaluation with him. Subsequently I did end up prescribing a prescription for Bactrim DS which she has been taking and having no complication with. Fortunately there does not appear to be any evidence of infection at this point in time as far as anything spreading, no want to touch, and overall I feel like things are showing signs of improvement. 01/13/18 on evaluation today patient appears to be even a little larger and deeper than last time. There still muscle exposed in the base of the wound. Nonetheless he does appear to be less erythematous I do believe inflammation is calming down also believe the infection looks like it's probably resolved at this time based on what I'm seeing. No fevers, chills, nausea, or vomiting noted at this time. 01/30/18 on evaluation today patient actually appears to visually look better for the most part. Unfortunately those visually this looks better he does seem to potentially have what may be an abscess in the muscle that has been noted in the central portion of the wound. This is the first time that I have noted what appears to be fluctuance in the central portion of the muscle. With that being said I'm somewhat more concerned about the fact that this might indicate an abscess formation at this location. I do believe that an ultrasound would be appropriate. This is likely something we need to try to do as soon as possible. He has been switch to mupirocin ointment and he is no longer using the steroid ointment as prescribed by dermatology he sees them again next  week he's been decreased from 60 to 40 mg of prednisone. 03/09/18 on evaluation today patient actually appears to be doing a little  better compared to last time I saw him. There's not as much erythema surrounding the wound itself. He I did review his most recent infectious disease note which was dated 02/24/18. He saw Dr. Michel Bickers in [place]. 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 Patient is now off of antibiotics and they are continually observing this. There seems to be no change in the past two weeks in my pinion based on what the patient says and what I see today compared to what Dr. Megan Salon likely saw two weeks ago. No fevers, chills, nausea, or vomiting noted at this time. 03/23/18 on evaluation today patient's wound actually appears to be showing signs of improvement which is good news. He is currently still on the Dapsone. He is also working on tapering the prednisone to get off of this and Dr. Lottie Rater is working with him in this regard. Nonetheless overall I feel like the wound is doing well it does appear based on the infectious disease note that I reviewed from Dr. 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 I did also review the results of his ultrasound of left lower extremity which revealed there was a dentist tissue in the base of the wound without an abscess noted. 04/06/18 on evaluation today the patient's left lateral lower extremity ulcer actually appears to be doing fairly well which is excellent news. There does not appear to be any evidence of infection at this time which is also great news. Overall he still does have a significantly large ulceration although little by little he seems to be making progress. He is down to 10 mg a day of the prednisone. 04/20/18 on evaluation today patient actually appears to be doing excellent at this time in regard to his left lower extremity ulcer. He's  making signs of good progress unfortunately this is taking much longer than we would really like to see but nonetheless he is making progress. Fortunately there does not appear to be any evidence of infection at this time. No fevers, chills, nausea, or vomiting noted at this time. The patient has not been using the Santyl due to the cost he hadn't got in this field yet. He's mainly been using the antibiotic ointment topically. Subsequently he also tells me that he really has not been scrubbing in the shower I think this would be helpful again as I told him it doesn't have to be anything too aggressive to even make it believe just enough to keep it free of some of the loose slough and biofilm on the wound surface. 05/11/18 on evaluation today patient's wound appears to be making slow but sure progress in regard to the left lateral lower extremity ulcer. He is been tolerating the dressing changes without complication. Fortunately there does not appear to be any evidence of infection at this time. He is still just using triple antibiotic ointment along with clobetasol occasionally over the area. He never got the Santyl and really does not seem to intend to in my pinion. 06/01/18 on evaluation today patient appears to be doing a little better in regard to his left lateral lower extremity ulcer. He states  that overall he does not feel like he is doing as well with the Dapsone as he did with the prednisone. Nonetheless he sees his dermatologist later today and is gonna talk to them about the possibility of going back on the prednisone. Overall again I believe that the wound would be better if you would utilize Santyl but he really does not seem to be interested in going back to the [place] at this point. He has been using triple antibiotic ointment. 06/15/18 on evaluation today patient's wound actually appears to be doing about the same at this point. Fortunately there is no signs of infection at this time. He  has made slight improvements although he continues to not really want to clean the wound bed at this point. He states that he just doesn't mess with it he doesn't want to cause any problems with everything else he has going on. He has been on medication, antibiotics as prescribed by his dermatologist, for a staff infection of his lower extremities which is really drying out now and looking much better he tells me. Fortunately there is no sign of overall infection. 06/29/18 on evaluation today patient appears to be doing well in regard to his left lateral lower surety ulcer all things considering. Fortunately his staff infection seems to be greatly improved compared to previous. He has no signs of infection and this is drying up quite nicely. He is still the doxycycline for this is no longer on cental, Dapsone, or any of the other medications. His dermatologist has recommended possibility of an infusion but right now he does not want to proceed with that. 07/13/18 on evaluation today patient appears to be doing about the same in regard to his left lateral lower surety ulcer. Fortunately there's no signs of infection at this time which is great news. Unfortunately he still builds up a significant amount of Slough/biofilm of the surface of the wound he still is not really cleaning this as he should be appropriately. Again I'm able to easily with saline and gauze remove the majority of this on the surface which if you would do this at home would likely be a dramatic improvement for him as far as getting the area to improve. Nonetheless overall I still feel like he is making progress is just very slow. I think Santyl will be of benefit for him as well. Still he has not gotten this as of this point. 07/27/18 on evaluation today patient actually appears to be doing little worse in regards of the erythema around the periwound region of the wound he also tells me that he's been having more drainage currently  compared to what he was experiencing last time I saw him. He states not quite as bad as what he had because this was infected previously but nonetheless is still appears to be doing poorly. Fortunately there is no evidence of systemic infection at this point. The patient tells me that he is not going to be able to afford the Santyl. He is still waiting to hear about the infusion therapy with his dermatologist. Apparently she wants an updated colonoscopy first. SINCERE, LIUZZI ([id number]) 08/10/18 on evaluation today patient appears to be doing better in regard to his left lateral lower extremity ulcer. Fortunately he is showing signs of improvement in this regard he's actually been approved for Remicade infusion's as well although this has not been scheduled as of yet. Fortunately there's no signs of active infection at this time in regard to the wound  although he is having some issues with infection of the right lower extremity is been seen as dermatologist for this. Fortunately they are definitely still working with him trying to keep things under control. 09/07/18 on evaluation today patient is actually doing rather well in regard to his left lateral lower extremity ulcer. He notes these actually having some hair grow back on his extremity which is something he has not seen in years. He also tells me that the pain is really not giving them any trouble at this time which is also good news overall she is very pleased with the progress he's using a combination of the mupirocin along with the probate is all mixed. 09/21/18 on evaluation today patient actually appears to be doing fairly well all things considered in regard to his looks from the ulcer. He's been tolerating the dressing changes without complication. Fortunately there's no signs of active infection at this time which is good news he is still on all antibiotics or prevention of the staff infection. He has been on prednisone for time although he  states it is gonna contact his dermatologist and see if she put them on a short course due to some irritation that he has going on currently. Fortunately there's no evidence of any overall worsening this is going very slow I think cental would be something that would be helpful for him although he states that $50 for tube is quite expensive. He therefore is not willing to get that at this point. 10/06/18 on evaluation today patient actually appears to be doing decently well in regard to his left lateral leg ulcer. He's been tolerating the dressing changes without complication. Fortunately there's no signs of active infection at this time. Overall I'm actually rather pleased with the progress he's making although it's slow he doesn't show any signs of infection and he does seem to be making some improvement. I do believe that he may need a switch up and dressings to try to help this to heal more appropriately and quickly. 10/19/18 on evaluation today patient actually appears to be doing better in regard to his left lateral lower extremity ulcer. This is shown signs of having much less Slough buildup at this point due to the fact he has been using the [employer]. Obviously this is very good news. The overall size of the wound is not dramatically smaller but again the appearance is. 11/02/18 on evaluation today patient actually appears to be doing quite well in regard to his lower [place] ulcer. A lot of the skin around the ulcer is actually somewhat irritating at this point this seems to be more due to the dressing causing irritation from the adhesive that anything else. Fortunately there is no signs of active infection at this time. 11/24/18 on evaluation today patient appears to be doing a little worse in regard to his overall appearance of his lower extremity ulcer. There's more erythema and warmth around the wound unfortunately. He is currently on doxycycline which he has been on for some time. With that  being said I'm not sure that seems to be helping with what appears to possibly be an acute cellulitis with regard to his left lower extremity ulcer. No fevers, chills, nausea, or vomiting noted at this time. 12/08/18 on evaluation today patient's wounds actually appears to be doing significantly better compared to his last evaluation. He has been using Santyl along with alternating tripling about appointment as well as the steroid cream seems to be doing quite well  and the wound is showing signs of improvement which is excellent news. Fortunately there's no evidence of infection and in fact his culture came back negative with only normal skin flora noted. 12/21/2018 upon evaluation today patient actually appears to be doing excellent with regard to his ulcer. This is actually the best that I have seen it since have been helping to take care of him. It is both smaller as well as less slough noted on the surface of the wound and seems to be showing signs of good improvement with new skin growing from the edges. He has been using just the triamcinolone he does wonder if he can get a refill of that ointment today. 01/04/2019 upon evaluation today patient actually appears to be doing well with regard to his left lateral lower extremity ulcer. With that being said it does not appear to be that he is doing quite as well as last time as far as progression is concerned. There does not appear to be any signs of infection or significant irritation which is good news. With that being said I do believe that he may benefit from switching to a collagen based dressing based on how clean The wound appears. 01/18/2019 on evaluation today patient actually appears to be doing well with regard to his wound on the left lower extremity. He is not made a lot of progress compared to where we were previous but nonetheless does seem to be doing okay at this time which is good news. There is no signs of active infection which is  also good news. My only concern currently is I do wish we can get him into utilizing the collagen dressing his insurance would not pay for the supplies that we ordered although it appears that he may be able to order this through his supply company that he typically utilizes. This is [employer]. Nonetheless he did try to order it during the office visit today and it appears this did go through. We will see if he can get that it is a different brand but nonetheless he has collagen and I do think will be beneficial. 02/01/2019 on evaluation today patient actually appears to be doing a little worse today in regard to the overall size of his wounds. Fortunately there is no signs of active infection at this time. That is visually. Nonetheless when this is happened before it was due to infection. For that reason were somewhat concerned about that this time as well. 02/08/2019 on evaluation today patient unfortunately appears to be doing slightly worse with regard to his wound upon evaluation today. Is measuring a little deeper and a little larger unfortunately. I am not really sure exactly what is causing this to enlarge he actually did see his dermatologist she is going to see about initiating Humira for him. Subsequently she also did do steroid injections into the wound itself in the periphery. Nonetheless still nonetheless he seems to be getting a little bit larger he is gone back to just using the steroid cream topically which I think is appropriate. I would say hold off on the collagen for the time being is definitely a good thing to do. Based on the culture results which we finally did get the final result back regarding it shows staph as the bacteria noted again that can be a normal skin bacteria based on the fact however he is having increased drainage and worsening of the wound measurement wise I would go ahead and place him on an antibiotic  today I do believe for this. 02/15/2019 on evaluation today  patient actually appears to be doing somewhat better in regard to his ulcer. There is no signs of worsening at this time I did review his culture results which showed evidence of Staphylococcus aureus but not MRSA. Again this could just be more related to the normal skin bacteria although he states the drainage has slowed down quite a bit he may have had a mild infection not just colonization. And was much smaller and then since around10/04/2019 on evaluation today patient appears to be doing unfortunately worse as far as the size of the wound. I really feel like that this is steadily getting larger again it had been doing excellent right at the beginning of September we have seen a steady increase in the area of the wound it is almost 2-1/2 times the size it was on September 1. Obviously this is a bad trend this is not wanting to see. For that reason we went back to using just the topical triamcinolone cream which does seem to help with inflammation. I checked him for bacteria by way of culture and nothing showed positive there. I am considering giving him a short course of a tapering steroid Kendal Raffo, Torien ([id number]) today to see if that is can be beneficial for him. The patient is in agreement with giving that a try. 03/08/2019 on evaluation today patient appears to be doing very well in comparison to last evaluation with regard to his lower extremity ulcer. This is showing signs of less inflammation and actually measuring slightly smaller compared to last time every other week over the past month and a half he has been measuring larger larger larger. Nonetheless I do believe that the issue has been inflammation the prednisone does seem to have been beneficial for him which is good news. No fevers, chills, nausea, vomiting, or diarrhea. 03/22/2019 on evaluation today patient appears to be doing about the same with regard to his leg ulcer. He has been tolerating the dressing changes without  complication. With that being said the wound seems to be mostly arrested at its current size but really is not making any progress except for when we prescribed the prednisone. He did show some signs of dropping as far as the overall size of the wound during that interval week. Nonetheless this is something he is not on long-term at this point and unfortunately I think he is getting need either this or else the Humira which his dermatologist has discussed try to get approval for. With that being said he will be seeing his dermatologist on the 11th of this month that is November. 04/19/2019 on evaluation today patient appears to be doing really about the same the wound is measuring slightly larger compared to last time I saw him. He has not been into the office since November 2 due to the fact that he unfortunately had Covid as that his entire family. He tells me that it was rough but they did pull-through and he seems to be doing much better. Fortunately there is no signs of active infection at this time. No fevers, chills, nausea, vomiting, or diarrhea. 05/10/2019 on evaluation today patient unfortunately appears to be doing significantly worse as compared to last time I saw him. He does tell me that he has had his first dose of Humira and actually is scheduled to get the next one in the upcoming week. With that being said he tells me also that in the past  several days he has been having a lot of issues with green drainage she showed me a picture this is more blue-green in color. He is also been having issues with increased sloughy buildup and the wound does appear to be larger today. Obviously this is not the direction that we want everything to take based on the starting of his Humira. Nonetheless I think this is definitely a result of likely infection and to be honest I think this is probably Pseudomonas causing the infection based on what I am seeing. 05/24/2019 on evaluation today patient  unfortunately appears to be doing significantly worse compared to his prior evaluation with me 2 weeks ago. I did review his culture results which showed that he does have Staph aureus as well as Pseudomonas noted on the culture. Nonetheless the Levaquin that I prescribed for him does not appear to have been appropriate and in fact he tells me he is no longer experiencing the green drainage and discharge that he had at the last visit. Fortunately there is no signs of active infection at this time which is good news although the wound has significantly worsened it in fact is much deeper than it was previous. We have been utilizing up to this point triamcinolone ointment as the prescription topical of choice but at this time I really feel like that the wound is getting need to be packed in order to appropriately manage this due to the deeper nature of the wound. Therefore something along the lines of an alginate dressing may be more appropriate. 05/31/2019 upon inspection today patient's wound actually showed signs of doing poorly at this point. Unfortunately he just does not seem to be making any good progress despite what we have tried. He actually did go ahead and pick up the Cipro and start taking that as he was noticing more green drainage he had previously completed the Levaquin that I prescribed for him as well. Nonetheless he missed his appointment for the seventh last week on Wednesday with the wound care center and [facility] where his dermatologist referred him. Obviously I do think a second opinion would be helpful at this point especially in light of the fact that the patient seems to be doing so poorly despite the fact that we have tried everything that I really know how at this point. The only thing that ever seems to have helped him in the past is when he was on high doses of continual steroids that did seem to make a difference for him. Right now he is on immune modulating medication  to try to help with the pyoderma but I am not sure that he is getting as much relief at this point as he is previously obtained from the use of steroids. 06/07/2019 upon evaluation today patient unfortunately appears to be doing worse yet again with regard to his wound. In fact I am starting to question whether or not he may have a fluid pocket in the muscle at this point based on the bulging and the soft appearance to the central portion of the muscle area. There is not anything draining from the muscle itself at this time which is good news but nonetheless the wound is expanding. I am not really seeing any results of the Humira as far as overall wound progression based on what I am seeing at this point. The patient has been referred for second opinion with regard to his wound to the [facility] wound care center by his dermatologist  which I definitely am not in opposition to. Unfortunately we tried multiple dressings in the past including collagen, alginate, and at one point even Hydrofera Blue. With that being said he is never really used it for any significant amount of time due to the fact that he often complains of pain associated with these dressings and then will go back to either using the Santyl which she has done intermittently or more frequently the triamcinolone. He is also using his own compression stockings. We have wrapped him in the past but again that was something else that he really was not a big fan of. 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 He seemed to do much better when he was on the steroids although honestly I understand that the steroids are not necessarily  the best medication to be on long-term obviously 06/14/2019 on evaluation today patient appears to be doing actually a little bit better with regard to the overall appearance with his leg. Unfortunately he does continue to have issues with what appears to be some fluid underneath the muscle although he did see the wound specialty center at [facility] last week their main goals were to see about infusion therapy in place of the Humira as they feel like that is not quite strong enough. They also recommended that we continue with the treatment otherwise as we are they felt like that was appropriate and they are okay with him continuing to follow-up here with [place] in that regard. With that being said they are also sending him to the vein specialist there to see about vein stripping and if that would be of benefit for him. Subsequently they also did not really address whether or not an ultrasound of the muscle area to see if there is anything that needs to be addressed here would be appropriate or not. For that reason I discussed this with him last week I think we may proceed down that road at this point. 06/21/2019 upon evaluation today patient's wound actually appears to be doing slightly better compared to previous evaluations. I do believe that he has made a difference with regard to the progression here with the use of oral steroids. Again in the past has been the only thing that is really calm things down. He does tell me that from [facility] is gotten a good news from there that there are no further vein stripping that is necessary at this point. I do not have that available for review today although the patient did relay this to me. He also did obtain and have the ultrasound of the wound completed which I did sign off on today. It does appear that there is no fluid collection under the muscle this is likely then just edematous tissue in general. That is also good news. Overall I still believe the inflammation is the  main issue here. He did inquire about the possibility of a wound VAC again with the muscle protruding like it is I am not really sure whether the wound VAC is necessarily ideal or not. That is something we will have to consider although I do believe he may need compression wrapping  to try to help with edema control which could potentially be of benefit. 06/28/2019 on evaluation today patient appears to be doing slightly better measurement wise although this is not terribly smaller he least seems to be trending towards that direction. With that being said he still seems to have purulent drainage noted in the wound bed at this time. He has been on Levaquin followed by Cipro over the past month. Unfortunately he still seems to have some issues with active infection at this time. I did perform a culture last week in order to evaluate and see if indeed there was still anything going on. Subsequently the culture did come back Giesler, Aaiden ([id number]) showing Pseudomonas which is consistent with the drainage has been having which is blue-green in color. He also has had an odor that again was somewhat consistent with Pseudomonas as well. Long story short it appears that the culture showed an intermediate finding with regard to how well the Cipro will work for the Pseudomonas infection. Subsequently being that he does not seem to be clearing up and at best what we are doing is just keeping this at [place] I think he may need to see infectious disease to discuss IV antibiotic options. 07/05/2019 upon evaluation today patient appears to be doing okay in regard to his leg ulcer. He has been tolerating the dressing changes at this point without complication. Fortunately there is no signs of active infection at this time which is good news. No fevers, chills, nausea, vomiting, or diarrhea. With that being said he does have an appointment with infectious disease tomorrow and his primary care on Wednesday. Again  the reason for the infectious disease referral was due to the fact that he did not seem to be fully resolving with the use of oral antibiotics and therefore we were thinking that IV antibiotic therapy may be necessary secondary to the fact that there was an intermediate finding for how effective the Cipro may be. Nonetheless again he has been having a lot of purulent and even green drainage. Fortunately right now that seems to have calmed down over the past week with the reinitiation of the oral antibiotic. Nonetheless we will see what Dr. Megan Salon has to say. 07/12/2019 upon evaluation today patient appears to be doing about the same at this point in regard to his left lower extremity ulcer. Fortunately there is no signs of active infection at this time which is good news I do believe the Levaquin has been beneficial I did review Dr. Hale Bogus note and to be honest I agree that the patient's leg does appear to be doing better currently. What we found in the past as he does not seem to really completely resolve he will stop the antibiotic and then subsequently things will revert back to having issues with blue-green drainage, increased pain, and overall worsening in general. Obviously that is the reason I sent him back to infectious disease. 07/19/2019 upon evaluation today patient appears to be doing roughly the same in size there is really no dramatic improvement. He has started back on the Levaquin at this point and though he seems to be doing okay he did still have a lot of blue/green drainage noted on evaluation today unfortunately. I think that this is still indicative more likely of a Pseudomonas infection as previously noted and again he does see Dr. Megan Salon in just a couple of days. I do not know that were really able to effectively clear this with just oral antibiotics alone  based on what I am seeing currently. Nonetheless we are still continue to try to manage as best we can with regard to  the patient and his wound. I do think the wrap was helpful in decreasing the edema which is excellent news. No fevers, chills, nausea, vomiting, or diarrhea. 07/26/2019 upon evaluation today patient appears to be doing slightly better with regard to the overall appearance of the muscle there is no dark discoloration centrally. Fortunately there is no signs of active infection at this time. No fevers, chills, nausea, vomiting, or diarrhea. Patient's wound bed currently the patient did have an appointment with Dr. Megan Salon at infectious disease last week. With that being said Dr. Megan Salon the patient states was still somewhat hesitant about put him on any IV antibiotics he wanted [place] to repeat cultures today and then see where things go going forward. He does look like Dr. Megan Salon because of some improvement the patient did have with the Levaquin wanted [place] to see about repeating cultures. If it indeed grows the Pseudomonas again then he recommended a possibility of considering a PICC line placement and IV antibiotic therapy. He plans to see the patient back in 1 to 2 weeks. 08/02/2019 upon evaluation today patient appears to be doing poorly with regard to his left lower extremity. We did get the results of his culture back it shows that he is still showing evidence of Pseudomonas which is consistent with the purulent/blue-green drainage that he has currently. Subsequently the culture also shows that he now is showing resistance to the oral fluoroquinolones which is unfortunate as that was really the only thing to treat the infection prior. I do believe that he is looking like this is going require IV antibiotic therapy to get this under control. Fortunately there is no signs of systemic infection at this time which is good news. The patient does see Dr. Megan Salon tomorrow. 08/09/2019 upon evaluation today patient appears to be doing better with regard to his left lower extremity ulcer in regard to the  overall appearance. He is currently on IV antibiotic therapy. As ordered by Dr. Megan Salon. Currently the patient is on ceftazidime which she is going to take for the next 2 weeks and then follow-up for 4 to 5-week appointment with Dr. Megan Salon. The patient started this this past Friday symptoms have not for a total of 3 days currently in full. 08/16/2019 upon evaluation today patient's wound actually does show muscle in the base of the wound but in general does appear to be much better as far as the overall evidence of infection is concerned. In fact I feel like this is for the most part cleared up he still on the IV antibiotics he has not completed the full course yet but I think he is doing much better which is excellent news. 08/23/2019 upon evaluation today patient appears to be doing about the same with regard to his wound at this point. He tells me that he still has pain unfortunately. Fortunately there is no evidence of systemic infection at this time which is great news. There is significant muscle protrusion. 09/13/19 upon evaluation today patient appears to be doing about the same in regard to his leg unfortunately. He still has a lot of drainage coming from the ulceration there is still muscle exposed. With that being said the patient's last wound culture still showed an intermediate finding with regard to the Pseudomonas he still having the bluish/green drainage as well. Overall I do not know that the  wound has completely cleared of infection at this point. Fortunately there is no signs of active infection systemically at this point which is good news. 09/20/2019 upon evaluation today patient's wound actually appears to be doing about the same based on what I am seeing currently. I do not see any signs of systemic infection he still does have evidence of some local infection and drainage. He did see Dr. Megan Salon last week and Dr. Megan Salon states that he probably does need a different IV  antibiotic although he does not want to put him on this until the patient begins the Remicade infusion which is actually scheduled for about 10 days out from today on 13 May. Following that time Dr. Megan Salon is good to see him back and then will evaluate the feasibility of starting him on the IV antibiotic therapy once again at that point. I do not disagree with this plan I do believe as Dr. Megan Salon stated in his note that I reviewed today that the patient's issue is multifactorial with the pyoderma being 1 aspect of this that were hoping the Remicade will be helpful for her. In the meantime I think the gentamicin is, helping to keep things under decent okay control in regard to the ulcer. 09/27/2019 upon evaluation today patient appears to be doing about the same with regard to his wound still there is a lot of muscle exposure though he does have some hyper granulation tissue noted around the edge and actually some granulation tissue starting to form over the muscle which is actually good news. Fortunately there is no evidence of active infection which is also good news. His pain is less at this point. 5/21; this is a patient I have not seen in a long time. 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 He has a large wound on the left lateral leg with protruding muscle. He comes in the clinic today showing the same area on his left medial ankle. He says there is been a spot there for some time although we have not previously defined this. Today he has a clearly defined area with slight amount of skin breakdown surrounded by raised areas with a purplish hue in color. This is not painful he says it is irritated. 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 He still has muscle protruding from the lateral portion of his left leg but fortunately the new area that  they were concerned about at his last visit does not appear to have opened at this point. He is currently on Remicade infusions and seems to be doing better in my opinion in fact the wound itself seems to be overall much better. The purplish discoloration that he did have seems to have resolved and I think that is a good sign that hopefully the Remicade is doing its job. He does Blucher, Arrian ([id number]) have some biofilm noted over the surface of the wound. 11/01/2019 on evaluation today patient's wound actually appears to be doing excellent at this time. Fortunately there is no evidence of active infection and overall I feel like he is making great progress. The Remicade seems to be due excellent job in my opinion. 11/08/19 evaluation today vision actually appears to be doing quite well with regard to his weight ulcer. He's been tolerating dressing changes without complication. Fortunately there is no evidence of infection. No fevers, chills, nausea, or vomiting noted at this  time. Overall states that is having more itching than pain which is actually a good sign in my opinion. 12/13/2019 upon evaluation today patient appears to be doing well today with regard to his wound. He has been tolerating the dressing changes without complication. Fortunately there is no sign of active infection at this time. No fevers, chills, nausea, vomiting, or diarrhea. Overall I feel like the infusion therapy has been very beneficial for him. 01/06/2020 on evaluation today patient appears to be doing well with regard to his wound. This is measuring smaller and actually looks to be doing better. Fortunately there is no signs of active infection at this point. No fevers, chills, nausea, vomiting, or diarrhea. With that being said he does still have the blue-green drainage but this does not seem to be causing any significant issues currently. He has been using the gentamicin that does seem to be keeping things under decent  control at this point. He goes later this morning for his next infusion therapy for the pyoderma which seems to also be very beneficial. 02/07/2020 on evaluation today patient appears to be doing about the same in regard to his wounds currently. Fortunately there is no signs of active infection systemically he does still have evidence of local infection still using gentamicin. He also is showing some signs of improvement albeit slowly I do feel like we are making some progress here. Objective Constitutional Well-nourished and well-hydrated in no acute distress. Vitals Time Taken: 8:05 AM, Height: 71 in, Weight: 338 lbs, BMI: 47.1, Temperature: 98.5 F, Pulse: 80 bpm, Respiratory Rate: 20 breaths/min, Blood Pressure: 155/80 mmHg. Respiratory normal breathing without difficulty. Psychiatric this patient is able to make decisions and demonstrates good insight into disease process. Alert and Oriented x 3. pleasant and cooperative. General Notes: Upon inspection patient's wound bed still showed some bright yellow/green drainage he tells me he still has some blue coloration on some of his dressings at times. Again he previously was on IV antibiotics with Dr. Megan Salon for Pseudomonas again there is been a question as to whether or not this has completely resolved but the gentamicin seems to be keeping this under some good control. Integumentary (Hair, Skin) Wound #1 status is Open. Original cause of wound was Gradually Appeared. The wound is located on the Left,Lateral Lower Leg. The wound measures 6cm length x 6.3cm width x 0.3cm depth; 29.688cm^2 area and 8.906cm^3 volume. There is muscle and Fat Layer (Subcutaneous Tissue) exposed. There is a large amount of purulent drainage noted. The wound margin is epibole. There is large (67-100%) pink granulation within the wound bed. There is a small (1-33%) amount of necrotic tissue within the wound bed including Adherent Slough. 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 Anesthetic (add to Medication List): Wound #1 Left,Lateral Lower Leg: Topical Lidocaine 4% cream applied to wound bed prior to debridement (In Clinic Only). Primary Wound Dressing: Gentamicin Sulfate Cream - mix 1:1 with triamcinolone to apply to the wound bed Secondary Dressing: Wound #1 Left,Lateral Lower Leg: Boardered Foam Dressing Dressing Change Frequency: Wound #1 Left,Lateral Lower Leg: Change dressing every day. Follow-up Appointments: Wound #1 Left,Lateral Lower Leg: Return Appointment in 2 weeks. Edema Control: Wound #1 Left,Lateral Lower Leg: Patient to wear own  compression stockings Elevate legs to the level of the heart and pump ankles as often as possible 1. I would recommend at this time that we go ahead and continue with the wound care measures as before and the patient is in agreement with that plan. I am going to suggest that we continue to monitor for any signs of worsening infection. 2. I am also can recommend the patient continue to try to elevate his legs much as possible and wear his compression sock I think that still important. 3. I also recommend the patient continue to use the gentamicin mixed with the triamcinolone I think that has done well and hopefully will continue to keep this at [place] as far as infection is concerned and coming down the inflammation. We will see patient back for reevaluation in 2 weeks here in the clinic. If anything worsens or changes patient will contact our office for additional recommendations. Electronic Signature(s) Signed: 02/07/2020 8:53:34 AM By: Worthy Keeler  PA-C Entered By: Worthy Keeler on 02/07/2020 08:53:34 Gibbard, Lonnell ([id number]) -------------------------------------------------------------------------------- SuperBill Details Patient Name: [name] Date of Service: 02/07/2020 Medical Record Number: [id number] Patient Account Number: [id number] Date of Birth/Sex: Mar 26, 1981 (39 y.o. M) Treating RN: [name] Primary Care Provider: [name] Other Clinician: Referring Provider: [name] Treating Provider/Extender: Melburn Hake, Maxamilian Amadon Weeks in Treatment: 165 Diagnosis Coding ICD-10 Codes Code Description [id number] Non-pressure chronic ulcer of left calf with fat layer exposed L88 Pyoderma gangrenosum L97.321 Non-pressure chronic ulcer of left ankle limited to breakdown of skin I87.2 Venous insufficiency (chronic) (peripheral) L03.116 Cellulitis of left lower limb Facility Procedures CPT4 Code: [id number] Description: 99213 - WOUND CARE VISIT-LEV 3 EST PT Modifier: Quantity: 1 Physician Procedures CPT4 Code: 3532992 Description: 42683 - WC PHYS LEVEL 3 - EST PT Modifier: Quantity: 1 CPT4 Code: Description: ICD-10 Diagnosis Description L97.222 Non-pressure chronic ulcer of left calf with fat layer exposed L88 Pyoderma gangrenosum L97.321 Non-pressure chronic ulcer of left ankle limited to breakdown of skin I87.2 Venous insufficiency (chronic)  (peripheral) Modifier: Quantity: Electronic Signature(s) Signed: 02/07/2020 8:53:48 AM By: Worthy Keeler PA-C Entered By: Worthy Keeler on 02/07/2020 08:53:47

## 2020-02-17 ENCOUNTER — Other Ambulatory Visit: Payer: Self-pay | Admitting: Primary Care

## 2020-02-17 DIAGNOSIS — E119 Type 2 diabetes mellitus without complications: Secondary | ICD-10-CM

## 2020-02-21 ENCOUNTER — Encounter: Payer: 59 | Attending: Physician Assistant | Admitting: Physician Assistant

## 2020-02-21 ENCOUNTER — Other Ambulatory Visit: Payer: Self-pay

## 2020-02-21 DIAGNOSIS — L88 Pyoderma gangrenosum: Secondary | ICD-10-CM | POA: Insufficient documentation

## 2020-02-21 DIAGNOSIS — I872 Venous insufficiency (chronic) (peripheral): Secondary | ICD-10-CM | POA: Diagnosis not present

## 2020-02-21 DIAGNOSIS — L97222 Non-pressure chronic ulcer of left calf with fat layer exposed: Secondary | ICD-10-CM | POA: Diagnosis not present

## 2020-02-21 DIAGNOSIS — F17210 Nicotine dependence, cigarettes, uncomplicated: Secondary | ICD-10-CM | POA: Diagnosis not present

## 2020-02-21 DIAGNOSIS — L03116 Cellulitis of left lower limb: Secondary | ICD-10-CM | POA: Diagnosis not present

## 2020-02-21 NOTE — Progress Notes (Signed)
[name] ([id number]) Visit Report for 02/21/2020 Arrival Information Details Patient Name: [name], [name] Date of Service: 02/21/2020 8:15 AM Medical Record Number: [id number] Patient Account Number: [id number] Date of Birth/Sex: [birth year] (39 y.o. M) Treating RN: [name] Primary Care Brilynn Biasi: [name] Other Clinician: Referring Anae Hams: [name] Treating Zell Doucette/Extender: Melburn Hake, [name]: 10 Visit Information History Since Last Visit Added or deleted any medications: No Patient Arrived: Ambulatory Any new allergies or adverse reactions: No Arrival Time: 08:22 Had a fall or experienced change in No Accompanied By: self activities of daily living that may affect Transfer Assistance: None risk of falls: Patient Identification Verified: Yes Signs or symptoms of abuse/neglect since last visito No Secondary Verification Process Completed: Yes Hospitalized since last visit: No Patient Requires Transmission-Based No Implantable device outside of the clinic excluding No Precautions: cellular tissue based products placed in the center Patient Has Alerts: Yes since last visit: Patient Alerts: 08/09/19 Picc Line Has Dressing in Place as Prescribed: Yes Right Pain Present Now: No Electronic Signature(s) Signed: 02/21/2020 3:55:24 PM By: Lorine Bears RCP, RRT, CHT Entered By: Lorine Bears on 02/21/2020 08:23:05 [name] ([id number]) -------------------------------------------------------------------------------- Clinic Level of Care Assessment Details Patient Name: [name] Date of Service: 02/21/2020 8:15 AM Medical Record Number: [id number] Patient Account Number: [id number] Date of Birth/Sex: 05/22/1980 (39 y.o. M) Treating RN: Army Melia Primary Care Mallerie Blok: [name] Other Clinician: Referring Mahin Guardia: [name] Treating Nathen Balaban/Extender: Melburn Hake, [name]:  167 Clinic Level of Care Assessment Items TOOL 4 Quantity Score []  - Use when only an EandM is performed on FOLLOW-UP visit 0 ASSESSMENTS - Nursing Assessment / Reassessment X - Reassessment of Co-morbidities (includes updates in patient status) 1 10 X- 1 5 Reassessment of Adherence to Treatment Plan ASSESSMENTS - Wound and Skin Assessment / Reassessment X - Simple Wound Assessment / Reassessment - one wound 1 5 []  - 0 Complex Wound Assessment / Reassessment - multiple wounds []  - 0 Dermatologic / Skin Assessment (not related to wound area) ASSESSMENTS - Focused Assessment []  - Circumferential Edema Measurements - multi extremities 0 []  - 0 Nutritional Assessment / Counseling / Intervention X- 1 5 Lower Extremity Assessment (monofilament, tuning fork, pulses) []  - 0 Peripheral Arterial Disease Assessment (using hand held doppler) ASSESSMENTS - Ostomy and/or Continence Assessment and Care []  - Incontinence Assessment and Management 0 []  - 0 Ostomy Care Assessment and Management (repouching, etc.) PROCESS - Coordination of Care X - Simple Patient / Family Education for ongoing care 1 15 []  - 0 Complex (extensive) Patient / Family Education for ongoing care []  - 0 Staff obtains Programmer, systems, Records, Test Results / Process Orders []  - 0 Staff telephones HHA, Nursing Homes / Clarify orders / etc []  - 0 Routine Transfer to another Facility (non-emergent condition) []  - 0 Routine Hospital Admission (non-emergent condition) []  - 0 New Admissions / Biomedical engineer / Ordering NPWT, Apligraf, etc. []  - 0 Emergency Hospital Admission (emergent condition) X- 1 10 Simple Discharge Coordination []  - 0 Complex (extensive) Discharge Coordination PROCESS - Special Needs []  - Pediatric / Minor Patient Management 0 []  - 0 Isolation Patient Management []  - 0 Hearing / Language / Visual special needs []  - 0 Assessment of Community assistance (transportation, D/C planning,  etc.) []  - 0 Additional assistance / Altered mentation []  - 0 Support Surface(s) Assessment (bed, cushion, seat, etc.) INTERVENTIONS - Wound Cleansing / Measurement Dudzik, Diem ([id number]) X- 1 5 Simple Wound Cleansing - one wound []  -  0 Complex Wound Cleansing - multiple wounds X- 1 5 Wound Imaging (photographs - any number of wounds) []  - 0 Wound Tracing (instead of photographs) X- 1 5 Simple Wound Measurement - one wound []  - 0 Complex Wound Measurement - multiple wounds INTERVENTIONS - Wound Dressings []  - Small Wound Dressing one or multiple wounds 0 X- 1 15 Medium Wound Dressing one or multiple wounds []  - 0 Large Wound Dressing one or multiple wounds []  - 0 Application of Medications - topical []  - 0 Application of Medications - injection INTERVENTIONS - Miscellaneous []  - External ear exam 0 []  - 0 Specimen Collection (cultures, biopsies, blood, body fluids, etc.) []  - 0 Specimen(s) / Culture(s) sent or taken to Lab for analysis []  - 0 Patient Transfer (multiple staff / Civil Service fast streamer / Similar devices) []  - 0 Simple Staple / Suture removal (25 or less) []  - 0 Complex Staple / Suture removal (26 or more) []  - 0 Hypo / Hyperglycemic Management (close monitor of Blood Glucose) []  - 0 Ankle / Brachial Index (ABI) - do not check if billed separately X- 1 5 Vital Signs Has the patient been seen at the hospital within the last three years: Yes Total Score: 85 Level Of Care: New/Established - Level 3 Electronic Signature(s) Signed: 02/21/2020 11:04:42 AM By: Army Melia Entered By: Army Melia on 02/21/2020 09:03:24 [name] ([id number]) -------------------------------------------------------------------------------- Encounter Discharge Information Details Patient Name: [name] Date of Service: 02/21/2020 8:15 AM Medical Record Number: [id number] Patient Account Number: [id number] Date of Birth/Sex: 1981/04/02 (39 y.o. M) Treating RN: Army Melia Primary Care Jazmynn Pho: [name] Other Clinician: Referring Clydette Privitera: [name] Treating Dayron Odland/Extender: Melburn Hake, [name]: 167 Encounter Discharge Information Items Discharge Condition: Stable Ambulatory Status: Ambulatory Discharge Destination: Home Transportation: Private Auto Accompanied By: self Schedule Follow-up Appointment: Yes Clinical Summary of Care: Electronic Signature(s) Signed: 02/21/2020 11:04:42 AM By: Army Melia Entered By: Army Melia on 02/21/2020 09:04:14 [name] ([id number]) -------------------------------------------------------------------------------- Lower Extremity Assessment Details Patient Name: [name] Date of Service: 02/21/2020 8:15 AM Medical Record Number: [id number] Patient Account Number: [id number] Date of Birth/Sex: 1980-07-26 (39 y.o. M) Treating RN: Army Melia Primary Care Malea Swilling: [name] Other Clinician: Referring Onda [name]: [name] Treating Ashvin Adelson/Extender: STONE III, [name]: 167 Edema Assessment Assessed: [Left: No] [Right: No] Edema: [Left: N] [Right: o] Vascular Assessment Pulses: Dorsalis Pedis Palpable: [Left:Yes] Electronic Signature(s) Signed: 02/21/2020 11:04:42 AM By: Army Melia Entered By: Army Melia on 02/21/2020 08:30:52 Hinkle, [name] ([id number]) -------------------------------------------------------------------------------- Multi Wound Chart Details Patient Name: [name] Date of Service: 02/21/2020 8:15 AM Medical Record Number: [id number] Patient Account Number: [id number] Date of Birth/Sex: 07-04-1980 (39 y.o. M) Treating RN: Army Melia Primary Care Demico Ploch: [name] Other Clinician: Referring Nyeisha Goodall: [name] Treating Raseel Jans/Extender: Melburn Hake, [name]: 167 Vital Signs Height(in): 71 Pulse(bpm): 83 Weight(lbs): 338 Blood Pressure(mmHg): 148/80 Body Mass Index(BMI):  47 Temperature(F): 98.3 Respiratory Rate(breaths/min): 18 Photos: [N/A:N/A] Wound Location: Left, Lateral Lower Leg N/A N/A Wounding Event: Gradually Appeared N/A N/A Primary Etiology: Pyoderma N/A N/A Comorbid History: Sleep Apnea, Hypertension, Colitis N/A N/A Date Acquired: 11/18/2015 N/A N/A Weeks of Treatment: 167 N/A N/A Wound Status: Open N/A N/A Measurements L x W x D (cm) 6x6x0.3 N/A N/A Area (cm) : 28.274 N/A N/A Volume (cm) : 8.482 N/A N/A % Reduction in Area: -476.00% N/A N/A % Reduction in Volume: -116.00% N/A N/A Classification: Full Thickness With Exposed N/A N/A Support Structures Exudate Amount: Large N/A N/A  Exudate Type: Purulent N/A N/A Exudate Color: yellow, brown, green N/A N/A Wound Margin: Epibole N/A N/A Granulation Amount: Large (67-100%) N/A N/A Granulation Quality: Pink N/A N/A Necrotic Amount: Small (1-33%) N/A N/A Exposed Structures: Fat Layer (Subcutaneous Tissue): N/A N/A Yes Muscle: Yes Fascia: No Tendon: No Joint: No Bone: No Epithelialization: None N/A N/A Treatment Notes Electronic Signature(s) Signed: 02/21/2020 11:04:42 AM By: Army Melia Entered By: Army Melia on 02/21/2020 08:58:50 [name] ([id number]) -------------------------------------------------------------------------------- Multi-Disciplinary Care Plan Details Patient Name: [name] Date of Service: 02/21/2020 8:15 AM Medical Record Number: [id number] Patient Account Number: [id number] Date of Birth/Sex: 04-01-1981 (39 y.o. M) Treating RN: Army Melia Primary Care Doree Kuehne: [name] Other Clinician: Referring Levoy Geisen: [name] Treating Demichael Traum/Extender: Melburn Hake, [name]: 167 Active Inactive Venous Leg Ulcer Nursing Diagnoses: Knowledge deficit related to disease process and management Potential for venous Insuffiency (use before diagnosis confirmed) Goals: Non-invasive venous studies are completed as ordered Date  Initiated: 12/11/2016 Target Resolution Date: 03/13/2017 Goal Status: Active Patient will maintain optimal edema control Date Initiated: 12/11/2016 Target Resolution Date: 03/13/2017 Goal Status: Active Patient/caregiver will verbalize understanding of disease process and disease management Date Initiated: 12/11/2016 Target Resolution Date: 03/13/2017 Goal Status: Active Verify adequate tissue perfusion prior to therapeutic compression application Date Initiated: 12/11/2016 Target Resolution Date: 03/13/2017 Goal Status: Active Interventions: Assess peripheral edema status every visit. Compression as ordered Provide education on venous insufficiency Treatment Activities: Therapeutic compression applied : 12/11/2016 Notes: Wound/Skin Impairment Nursing Diagnoses: Impaired tissue integrity Knowledge deficit related to ulceration/compromised skin integrity Goals: Patient/caregiver will verbalize understanding of skin care regimen Date Initiated: 12/11/2016 Target Resolution Date: 03/13/2017 Goal Status: Active Ulcer/skin breakdown will have a volume reduction of 30% by week 4 Date Initiated: 12/11/2016 Target Resolution Date: 03/13/2017 Goal Status: Active Ulcer/skin breakdown will have a volume reduction of 50% by week 8 Date Initiated: 12/11/2016 Target Resolution Date: 03/13/2017 Goal Status: Active Ulcer/skin breakdown will have a volume reduction of 80% by week 12 Date Initiated: 12/11/2016 Target Resolution Date: 03/13/2017 Goal Status: Active Ulcer/skin breakdown will heal within 14 weeks Date Initiated: 12/11/2016 Target Resolution Date: 03/13/2017 Goal Status: Active JATINDER, MCDONAGH ([id number]) Interventions: Assess patient/caregiver ability to obtain necessary supplies Assess patient/caregiver ability to perform ulcer/skin care regimen upon admission and as needed Assess ulceration(s) every visit Provide education on ulcer and skin care Treatment Activities: Skin care  regimen initiated : 12/11/2016 Topical wound management initiated : 12/11/2016 Notes: Electronic Signature(s) Signed: 02/21/2020 11:04:42 AM By: Army Melia Entered By: Army Melia on 02/21/2020 08:58:39 Dirr, [name] ([id number]) -------------------------------------------------------------------------------- Pain Assessment Details Patient Name: [name] Date of Service: 02/21/2020 8:15 AM Medical Record Number: [id number] Patient Account Number: [id number] Date of Birth/Sex: Sep 25, 1980 (39 y.o. M) Treating RN: Army Melia Primary Care Meliya Mcconahy: [name] Other Clinician: Referring Zaina Jenkin: [name] Treating Ilya Ess/Extender: Melburn Hake, [name]: 167 Active Problems Location of Pain Severity and Description of Pain Patient Has Paino No Site Locations Pain Management and Medication Current Pain Management: Electronic Signature(s) Signed: 02/21/2020 11:04:42 AM By: Army Melia Entered By: Army Melia on 02/21/2020 08:27:23 [name] ([id number]) -------------------------------------------------------------------------------- Patient/Caregiver Education Details Patient Name: [name] Date of Service: 02/21/2020 8:15 AM Medical Record Number: [id number] Patient Account Number: [id number] Date of Birth/Gender: 24-Aug-1980 (39 y.o. M) Treating RN: Army Melia Primary Care Physician: [name] Other Clinician: Referring Physician: [name] Treating Physician/Extender: Melburn Hake, [name]: 167 Education Assessment Education Provided To: Patient Education Topics Provided Wound/Skin Impairment: Handouts: Caring for  Your Ulcer Methods: Demonstration, Explain/Verbal Responses: State content correctly Electronic Signature(s) Signed: 02/21/2020 11:04:42 AM By: Army Melia Entered By: Army Melia on 02/21/2020 09:03:40 Couchman, [name]  ([id number]) -------------------------------------------------------------------------------- Wound Assessment Details Patient Name: [name] Date of Service: 02/21/2020 8:15 AM Medical Record Number: [id number] Patient Account Number: [id number] Date of Birth/Sex: 1980/07/18 (39 y.o. M) Treating RN: Army Melia Primary Care Debar Plate: [name] Other Clinician: Referring Samanthamarie Ezzell: [name] Treating Esa Raden/Extender: Melburn Hake, [name]: 167 Wound Status Wound Number: 1 Primary Etiology: Pyoderma Wound Location: Left, Lateral Lower Leg Wound Status: Open Wounding Event: Gradually Appeared Comorbid History: Sleep Apnea, Hypertension, Colitis Date Acquired: 11/18/2015 Weeks Of Treatment: 167 Clustered Wound: No Photos Wound Measurements Length: (cm) 6 Width: (cm) 6 Depth: (cm) 0.3 Area: (cm) 28.274 Volume: (cm) 8.482 % Reduction in Area: -476% % Reduction in Volume: -116% Epithelialization: None Wound Description Classification: Full Thickness With Exposed Support Structures Wound Margin: Epibole Exudate Amount: Large Exudate Type: Purulent Exudate Color: yellow, brown, green Foul Odor After Cleansing: No Slough/Fibrino Yes Wound Bed Granulation Amount: Large (67-100%) Exposed Structure Granulation Quality: Pink Fascia Exposed: No Necrotic Amount: Small (1-33%) Fat Layer (Subcutaneous Tissue) Exposed: Yes Necrotic Quality: Adherent Slough Tendon Exposed: No Muscle Exposed: Yes Necrosis of Muscle: No Joint Exposed: No Bone Exposed: No Treatment Notes Wound #1 (Left, Lateral Lower Leg) Notes TCA with Jarrett Soho [place], [place] ([id number]) Electronic Signature(s) Signed: 02/21/2020 11:04:42 AM By: Army Melia Entered By: Army Melia on 02/21/2020 08:30:34 [name] ([id number]) -------------------------------------------------------------------------------- Vitals Details Patient Name: [name] Date of Service: 02/21/2020  8:15 AM Medical Record Number: [id number] Patient Account Number: [id number] Date of Birth/Sex: Mar 03, 1981 (39 y.o. M) Treating RN: [name] Primary Care Caralynn Gelber: [name] Other Clinician: Referring Darielys Giglia: [name] Treating Morena Mckissack/Extender: Melburn Hake, [name]: 167 Vital Signs Time Taken: 08:20 Temperature (F): 98.3 Height (in): 71 Pulse (bpm): 83 Weight (lbs): 338 Respiratory Rate (breaths/min): 18 Body Mass Index (BMI): 47.1 Blood Pressure (mmHg): 148/80 Reference Range: 80 - 120 mg / dl Electronic Signature(s) Signed: 02/21/2020 3:55:24 PM By: Lorine Bears RCP, RRT, CHT Entered By: Becky Sax, Amado Nash on 02/21/2020 08:24:23

## 2020-02-21 NOTE — Progress Notes (Addendum)
HANI, CAMPUSANO ([id number]) Visit Report for 02/21/2020 Chief Complaint Document Details Patient Name: [name] Date of Service: 02/21/2020 8:15 AM Medical Record Number: [id number] Patient Account Number: [id number] Date of Birth/Sex: Jul 14, 1980 (39 y.o. M) Treating RN: Army Melia Primary Care Provider: [name] Other Clinician: Referring Provider: [name] Treating Provider/Extender: Melburn Hake, Mikela Senn Weeks in Treatment: 167 Information Obtained from: Patient Chief Complaint He is here in follow up evaluation for LLE pyoderma ulcer Electronic Signature(s) Signed: 02/21/2020 8:56:40 AM By: Worthy Keeler PA-C Entered By: Worthy Keeler on 02/21/2020 08:56:40 AJAI, HARVILLE ([id number]) -------------------------------------------------------------------------------- HPI Details Patient Name: [name] Date of Service: 02/21/2020 8:15 AM Medical Record Number: [id number] Patient Account Number: [id number] Date of Birth/Sex: Oct 02, 1980 (39 y.o. M) Treating RN: Army Melia Primary Care Provider: [name] Other Clinician: Referring Provider: [name] Treating Provider/Extender: Melburn Hake, Lockie Bothun Weeks in Treatment: 167 History of Present Illness HPI Description: 12/04/16; 39 year old man who comes into the clinic today for review of a wound on the posterior left calf. He tells me that is been there for about a year. He is not a diabetic he does smoke half a pack per day. He was seen in the ER on 11/20/16 felt to have cellulitis around the wound and was given clindamycin. An x-ray did not show osteomyelitis. The patient initially tells me that he has a milk allergy that sets off a pruritic itching rash on his lower legs which she scratches incessantly and he thinks that's what may have set up the wound. He has been using various topical antibiotics and ointments without any effect. He works in a [employer] and is on his feet all day. He does not have a  prior history of wounds however he does have the rash on both lower legs the right arm and the ventral aspect of his left arm. These are excoriations and clearly have had scratching however there are of macular looking areas on both legs including a substantial larger area on the right leg. This does not have an underlying open area. There is no blistering. The patient tells me that 2 years ago in [place] in response to the rash on his legs he saw a dermatologist who told him he had a condition which may be pyoderma gangrenosum although I may be putting words into his mouth. He seemed to recognize this. 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 He is not in any treatment for this. He's had no recent travel 12/11/16; the patient arrives today with his wound and roughly the same condition we've been using silver alginate this is a deep punched out wound with some surrounding erythema but no tenderness. Biopsy I did did not show confirmed pyoderma gangrenosum suggested nonspecific inflammation and vasculitis but does not provide an actual description of what was seen by the pathologist. I'm really not able to understand this We have also received information from the patient's dermatologist in [place] notes from April 2016. This was a doctor Agarwal-antal. 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 He was prescribed topical steroid high potency under occlusion which helped but at this point the patient did not have a deep punched out wound. 12/18/16; the patient's wound is larger in terms of surface area however this surface looks better and there is less depth. The surrounding erythema also is better. The patient states that the wrap we put on came off 2 days ago when he has been using his  compression stockings. He we are in the process of getting a dermatology consult. 12/26/16 on evaluation today patient's left lower extremity wound shows evidence of  infection with surrounding erythema noted. He has been tolerating the dressing changes but states that he has noted more discomfort. There is a larger area of erythema surrounding the wound. No fevers, chills, nausea, or vomiting noted at this time. With that being said the wound still does have slough covering the surface. He is not allergic to any medication that he is aware of at this point. In regard to his right lower extremity he had several regions that are erythematous and pruritic he wonders if there's anything we can do to help that. 01/02/17 I reviewed patient's wound culture which was obtained his visit last week. He was placed on doxycycline at that point. Unfortunately that does not appear to be an antibiotic that would likely help with the situation however the pseudomonas noted on culture is sensitive to Cipro. Also unfortunately patient's wound seems to have a large compared to last week's evaluation. Not severely so but there are definitely increased measurements in general. He is continuing to have discomfort as well he writes this to be a seven out of 10. In fact he would prefer me not to perform any debridement today due to the fact that he is having discomfort and considering he has an active infection on the little reluctant to do so anyway. No fevers, chills, nausea, or vomiting noted at this time. 01/08/17; patient seems dermatology on September 5. I suspect dermatology will want the slides from the biopsy I did sent to their pathologist. I'm not sure if there is a way we can expedite that. In any case the culture I did before I left on vacation 3 weeks ago showed Pseudomonas he was given 10 days of Cipro and per her description of her intake nurses is actually somewhat better this week although the wound is quite a bit bigger than I remember the last time I saw this. He still has 3 more days of Cipro 01/21/17; dermatology appointment tomorrow. He has completed the ciprofloxacin  for Pseudomonas. Surface of the wound looks better however he is had some deterioration in the lesions on his right leg. Meantime the left lateral leg wound we will continue with sample 01/29/17; patient had his dermatology appointment but I can't yet see that note. He is completed his antibiotics. The wound is more superficial but considerably larger in circumferential area than when he came in. This is in his left lateral calf. He also has swollen erythematous areas with superficial wounds on the right leg and small papular areas on both arms. There apparently areas in her his upper thighs and buttocks I did not look at those. Dermatology biopsied the right leg. Hopefully will have their input next week. 02/05/17; patient went back to see his dermatologist who told him that he had a "scratching problem" as well as staph. He is now on a 30 day course of doxycycline and I believe she gave him triamcinolone cream to the right leg areas to help with the itching [not exactly sure but probably triamcinolone]. She apparently looked at the left lateral leg wound although this was not rebiopsied and I think felt to be ultimately part of the same pathogenesis. He is using sample border foam and changing nevus himself. He now has a new open area on the right posterior leg which was his biopsy site I don't have any of the  dermatology notes 02/12/17; we put the patient in compression last week with SANTYL to the wound on the left leg and the biopsy. Edema is much better and the depth of the wound is now at level of skin. Area is still the same oBiopsy site on the right lateral leg we've also been using santyl with a border foam dressing and he is changing this himself. 02/19/17; Using silver alginate started last week to both the substantial left leg wound and the biopsy site on the right wound. He is tolerating compression well. Has a an appointment with his primary M.D. tomorrow wondering about diuretics although  I'm wondering if the edema problem is actually lymphedema 02/26/17; the patient has been to see his primary doctor Dr. Jerrel Ivory at [place] our primary care. She started him on Lasix 20 mg and this seems to have helped with the edema. However we are not making substantial change with the left lateral calf wound and inflammation. The biopsy site on the right leg also looks stable but not really all that different. 03/12/17; the patient has been to see vein and vascular Dr. Lucky Cowboy. He has had venous reflux studies I have not reviewed these. I did get a call from his dermatology office. They felt that he might have pathergy based on their biopsy on his right leg which led them to look at the slides of NYKOLAS, BACALLAO ([id number]) the biopsy I did on the left leg and they wonder whether this represents pyoderma gangrenosum which was the original supposition in a man with ulcerative colitis albeit inactive for many years. They therefore recommended clobetasol and tetracycline i.e. 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 03/26/17; apparently the patient just had reflux studies not an appointment with Dr. dew. She arrives in clinic today having applied clobetasol for 2-3 weeks. He notes over the last 2-3 days excessive drainage having to change the dressing 3-4 times a day and also expanding erythema. 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 Lillia Carmel at [name] locally who kindly saw him at my request. 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 My concern was the degree of erythema medially extending from his left leg wound which was either inflammation from pyoderma  or cellulitis. I put him on Augmentin however culture of the wound showed Pseudomonas which is quinolone sensitive. I really don't believe he has cellulitis however in view of everything I will continue and give him a course of Cipro. He is also on doxycycline as an immune modulator for the pyoderma. In addition to his original wound on the left lateral leg with surrounding erythema he has a wound on the right posterior calf which was an original biopsy site done by dermatology. 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 Saw Dr. Lillia Carmel yesterday. He has been using topical antibiotics to both wound areas his original wound on the left and the biopsies/pathergy area on the right. There is definitely some improvement in the inflammation around the wound on the right although the patient states he has increasing sensitivity of the wounds. He is on prednisone 60 and doxycycline 1 as prescribed by Dr. Lillia Carmel. He is covering the topical antibiotic  with gauze and putting this in his own compression stocks and changing this daily. He states that Dr. Lottie Rater did a culture of the left leg wound yesterday 05/07/17; pyoderma gangrenosum. The patient saw Dr. Lillia Carmel yesterday and has a follow-up with her in one month. He is still using topical antibiotics to both wounds although he can't recall exactly what type. He is still on prednisone 60 mg. Dr. Lillia Carmel stated that the doxycycline could stop if we were in agreement. 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 The right medial leg was induced by biopsy/pathergy. The area on the right is essentially healed. 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 The right medial leg has closed and remains closed. He  is still on prednisone 60. oHe tells me he missed his last dermatology appointment with Dr. Lillia Carmel but will make another appointment. He reports that her blood sugar at a recent screen in [place] was high 200's. He was 180 today. 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 I think he is going to require still much longer prednisone perhaps another 3 months before attempting to taper. In the meantime his wound is a lot better. Smaller. He is cleaning this off daily and applying topical antibiotics. When he was last in the clinic I thought about changing to [facility] and actually put in a couple of calls to dermatology although probably not during their business hours. 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 He continues on oral prednisone. He has been using triple antibiotic ointment. There is surface debris and we will transition to [facility] and have him return in 2 weeks. He has lost 30 pounds since his last appointment with lifestyle modification. He may benefit from topical steroid cream for treatment this can be considered at a later date. 08/22/17 on evaluation today patient appears to actually be doing rather well in regard to his left lateral lower extremity ulcer. He has actually been managed by Dr. Dellia Nims most recently. Patient is currently on oral steroids at this time. This seems to have been of benefit for him. Nonetheless his last visit was actually with Leah on 08/06/17. Currently he is not utilizing any topical steroid creams although this could be of benefit as well. No fevers, chills, nausea, or vomiting noted at this time. 09/05/17 on evaluation today patient appears to be doing better in regard to his left lateral lower extremity ulcer. He has been tolerating the dressing changes without complication. He is using Santyl with good effect. Overall I'm  very pleased with how things are standing at this point. Patient likewise is happy that this is doing better. 09/19/17 on evaluation today patient actually appears to be doing rather well in regard to his left lateral lower extremity ulcer. 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 He's not having any significant pain. 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 He has been tolerating the Santyl without complication and in general I feel like he's making good progress. 10/17/17 on evaluation  today patient appears to be doing very well in regard to his left lateral lower surety ulcer. He has been tolerating the dressing changes without complication. There does not appear to be any evidence of infection he's alternating the Santyl and the triple antibiotic ointment every other day this seems to be doing well for him. 11/03/17 on evaluation today patient appears to be doing very well in regard to his left lateral lower extremity ulcer. He is been tolerating the dressing changes without complication which is good news. Fortunately there does not appear to be any evidence of infection which is also great news. Overall is doing excellent they are starting to taper down on the prednisone is down to 40 mg at this point it also started topical clobetasol for him. 11/17/17 on evaluation today patient appears to be doing well in regard to his left lateral lower surety ulcer. He's been tolerating the dressing changes without complication. He does note that he is having no pain, no excessive drainage or discharge, and overall he feels like things are going about how he would expect and hope they would. Overall he seems to have no evidence of infection at this time in my opinion which is good news. 12/04/17-He is seen in follow-up  evaluation for right lateral lower extremity ulcer. He has been applying topical steroid cream. Today's measurement show slight increase in size. Over the next 2 weeks we will transition to every other day Santyl and steroid cream. He has been encouraged to monitor for changes and notify clinic with any concerns 12/15/17 on evaluation today patient's left lateral motion the ulcer and fortunately is doing worse again at this point. This just since last week to this week has close to doubled in size according to the patient. I did not seeing last week's I do not have a visual to compare this to in our system was also down so we do not have all the charts and at this point. Nonetheless it does have me somewhat concerned in regard to the fact that again he was worried enough about it he has contact the dermatology that placed them back on the full strength, 50 mg a day of the prednisone that he was taken previous. He continues to alternate using clobetasol along with Santyl at this point. He is obviously somewhat frustrated. 12/22/17 on evaluation today patient appears to be doing a little worse compared to last evaluation. Unfortunately the wound is a little deeper and slightly larger than the last week's evaluation. With that being said he has made some progress in regard to the irritation surrounding at this time unfortunately despite that progress that's been made he still has a significant issue going on here. 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 DAVISON, OHMS ([id number]) For that reason I discussed with him today the possibility of perform a wound culture to ensure there's nothing overtly infected. 01/06/18 on evaluation today patient's wound is larger and deeper than previously evaluated. With that being said it did appear that his wound was  infected after my last evaluation with him. Subsequently I did end up prescribing a prescription for Bactrim DS which she has been taking and having no complication with. Fortunately there does not appear to be any evidence of infection at this point in time as far as anything spreading, no want to touch, and overall I  feel like things are showing signs of improvement. 01/13/18 on evaluation today patient appears to be even a little larger and deeper than last time. There still muscle exposed in the base of the wound. Nonetheless he does appear to be less erythematous I do believe inflammation is calming down also believe the infection looks like it's probably resolved at this time based on what I'm seeing. No fevers, chills, nausea, or vomiting noted at this time. 01/30/18 on evaluation today patient actually appears to visually look better for the most part. Unfortunately those visually this looks better he does seem to potentially have what may be an abscess in the muscle that has been noted in the central portion of the wound. This is the first time that I have noted what appears to be fluctuance in the central portion of the muscle. With that being said I'm somewhat more concerned about the fact that this might indicate an abscess formation at this location. I do believe that an ultrasound would be appropriate. This is likely something we need to try to do as soon as possible. He has been switch to mupirocin ointment and he is no longer using the steroid ointment as prescribed by dermatology he sees them again next week he's been decreased from 60 to 40 mg of prednisone. 03/09/18 on evaluation today patient actually appears to be doing a little better compared to last time I saw him. There's not as much erythema surrounding the wound itself. He I did review his most recent infectious disease note which was dated 02/24/18. He saw Dr. Michel Bickers in [place]. 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 Patient is now off of antibiotics and they are continually observing this. There seems to be no change in the past two weeks in my pinion based on what the patient says and what I see today compared to what Dr. Megan Salon likely saw two weeks ago. No fevers, chills, nausea, or vomiting noted at this time. 03/23/18 on evaluation today patient's wound actually appears to be showing signs of improvement which is good news. He is currently still on the Dapsone. He is also working on tapering the prednisone to get off of this and Dr. Lottie Rater is working with him in this regard. Nonetheless overall I feel like the wound is doing well it does appear based on the infectious disease note that I reviewed from Dr. 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 I did also review the results of his ultrasound of left lower extremity which revealed there was a dentist tissue in the base of the wound without an abscess noted. 04/06/18 on evaluation today the patient's left lateral lower extremity ulcer actually appears to be doing fairly well which is excellent news. There does not appear to be any evidence of infection at this time which is also great news. Overall he still does have a significantly large ulceration although little by little he seems to be making progress. He is down to 10 mg a day of the prednisone. 04/20/18 on evaluation today patient actually appears to be doing excellent at this time in regard to his left lower extremity ulcer. He's making signs of good progress unfortunately this is taking much longer than we would really like to see but nonetheless  he is making progress. Fortunately there does not appear to be any evidence of infection at this time. No fevers, chills, nausea, or vomiting noted at  this time. The patient has not been using the Santyl due to the cost he hadn't got in this field yet. He's mainly been using the antibiotic ointment topically. Subsequently he also tells me that he really has not been scrubbing in the shower I think this would be helpful again as I told him it doesn't have to be anything too aggressive to even make it believe just enough to keep it free of some of the loose slough and biofilm on the wound surface. 05/11/18 on evaluation today patient's wound appears to be making slow but sure progress in regard to the left lateral lower extremity ulcer. He is been tolerating the dressing changes without complication. Fortunately there does not appear to be any evidence of infection at this time. He is still just using triple antibiotic ointment along with clobetasol occasionally over the area. He never got the Santyl and really does not seem to intend to in my pinion. 06/01/18 on evaluation today patient appears to be doing a little better in regard to his left lateral lower extremity ulcer. He states that overall he does not feel like he is doing as well with the Dapsone as he did with the prednisone. Nonetheless he sees his dermatologist later today and is gonna talk to them about the possibility of going back on the prednisone. Overall again I believe that the wound would be better if you would utilize Santyl but he really does not seem to be interested in going back to the [place] at this point. He has been using triple antibiotic ointment. 06/15/18 on evaluation today patient's wound actually appears to be doing about the same at this point. Fortunately there is no signs of infection at this time. He has made slight improvements although he continues to not really want to clean the wound bed at this point. He states that he just doesn't mess with it he doesn't want to cause any problems with everything else he has going on. He has been on medication, antibiotics  as prescribed by his dermatologist, for a staff infection of his lower extremities which is really drying out now and looking much better he tells me. Fortunately there is no sign of overall infection. 06/29/18 on evaluation today patient appears to be doing well in regard to his left lateral lower surety ulcer all things considering. Fortunately his staff infection seems to be greatly improved compared to previous. He has no signs of infection and this is drying up quite nicely. He is still the doxycycline for this is no longer on cental, Dapsone, or any of the other medications. His dermatologist has recommended possibility of an infusion but right now he does not want to proceed with that. 07/13/18 on evaluation today patient appears to be doing about the same in regard to his left lateral lower surety ulcer. Fortunately there's no signs of infection at this time which is great news. Unfortunately he still builds up a significant amount of Slough/biofilm of the surface of the wound he still is not really cleaning this as he should be appropriately. Again I'm able to easily with saline and gauze remove the majority of this on the surface which if you would do this at home would likely be a dramatic improvement for him as far as getting the area to improve. Nonetheless overall I still feel  like he is making progress is just very slow. I think Santyl will be of benefit for him as well. Still he has not gotten this as of this point. 07/27/18 on evaluation today patient actually appears to be doing little worse in regards of the erythema around the periwound region of the wound he also tells me that he's been having more drainage currently compared to what he was experiencing last time I saw him. He states not quite as bad as what he had because this was infected previously but nonetheless is still appears to be doing poorly. Fortunately there is no evidence of systemic infection at this point. The patient  tells me that he is not going to be able to afford the Santyl. He is still waiting to hear about the infusion therapy with his dermatologist. Apparently she wants an updated colonoscopy first. 08/10/18 on evaluation today patient appears to be doing better in regard to his left lateral lower extremity ulcer. Fortunately he is showing signs of improvement in this regard he's actually been approved for Remicade infusion's as well although this has not been scheduled as of yet. Fortunately there's no signs of active infection at this time in regard to the wound although he is having some issues with infection of the right lower extremity is been seen as dermatologist for this. Fortunately they are definitely still working with him trying to keep things under control. ELIZEO, RODRIQUES ([id number]) 09/07/18 on evaluation today patient is actually doing rather well in regard to his left lateral lower extremity ulcer. He notes these actually having some hair grow back on his extremity which is something he has not seen in years. He also tells me that the pain is really not giving them any trouble at this time which is also good news overall she is very pleased with the progress he's using a combination of the mupirocin along with the probate is all mixed. 09/21/18 on evaluation today patient actually appears to be doing fairly well all things considered in regard to his looks from the ulcer. He's been tolerating the dressing changes without complication. Fortunately there's no signs of active infection at this time which is good news he is still on all antibiotics or prevention of the staff infection. He has been on prednisone for time although he states it is gonna contact his dermatologist and see if she put them on a short course due to some irritation that he has going on currently. Fortunately there's no evidence of any overall worsening this is going very slow I think cental would be something that would be  helpful for him although he states that $50 for tube is quite expensive. He therefore is not willing to get that at this point. 10/06/18 on evaluation today patient actually appears to be doing decently well in regard to his left lateral leg ulcer. He's been tolerating the dressing changes without complication. Fortunately there's no signs of active infection at this time. Overall I'm actually rather pleased with the progress he's making although it's slow he doesn't show any signs of infection and he does seem to be making some improvement. I do believe that he may need a switch up and dressings to try to help this to heal more appropriately and quickly. 10/19/18 on evaluation today patient actually appears to be doing better in regard to his left lateral lower extremity ulcer. This is shown signs of having much less Slough buildup at this point due to the fact he has  been using the Santyl. Obviously this is very good news. The overall size of the wound is not dramatically smaller but again the appearance is. 11/02/18 on evaluation today patient actually appears to be doing quite well in regard to his lower [place] ulcer. A lot of the skin around the ulcer is actually somewhat irritating at this point this seems to be more due to the dressing causing irritation from the adhesive that anything else. Fortunately there is no signs of active infection at this time. 11/24/18 on evaluation today patient appears to be doing a little worse in regard to his overall appearance of his lower extremity ulcer. There's more erythema and warmth around the wound unfortunately. He is currently on doxycycline which he has been on for some time. With that being said I'm not sure that seems to be helping with what appears to possibly be an acute cellulitis with regard to his left lower extremity ulcer. No fevers, chills, nausea, or vomiting noted at this time. 12/08/18 on evaluation today patient's wounds actually appears to  be doing significantly better compared to his last evaluation. He has been using Santyl along with alternating tripling about appointment as well as the steroid cream seems to be doing quite well and the wound is showing signs of improvement which is excellent news. Fortunately there's no evidence of infection and in fact his culture came back negative with only normal skin flora noted. 12/21/2018 upon evaluation today patient actually appears to be doing excellent with regard to his ulcer. This is actually the best that I have seen it since have been helping to take care of him. It is both smaller as well as less slough noted on the surface of the wound and seems to be showing signs of good improvement with new skin growing from the edges. He has been using just the triamcinolone he does wonder if he can get a refill of that ointment today. 01/04/2019 upon evaluation today patient actually appears to be doing well with regard to his left lateral lower extremity ulcer. With that being said it does not appear to be that he is doing quite as well as last time as far as progression is concerned. There does not appear to be any signs of infection or significant irritation which is good news. With that being said I do believe that he may benefit from switching to a collagen based dressing based on how clean The wound appears. 01/18/2019 on evaluation today patient actually appears to be doing well with regard to his wound on the left lower extremity. He is not made a lot of progress compared to where we were previous but nonetheless does seem to be doing okay at this time which is good news. There is no signs of active infection which is also good news. My only concern currently is I do wish we can get him into utilizing the collagen dressing his insurance would not pay for the supplies that we ordered although it appears that he may be able to order this through his supply company that he typically utilizes.  This is [employer]. Nonetheless he did try to order it during the office visit today and it appears this did go through. We will see if he can get that it is a different brand but nonetheless he has collagen and I do think will be beneficial. 02/01/2019 on evaluation today patient actually appears to be doing a little worse today in regard to the overall size of his wounds.  Fortunately there is no signs of active infection at this time. That is visually. Nonetheless when this is happened before it was due to infection. For that reason were somewhat concerned about that this time as well. 02/08/2019 on evaluation today patient unfortunately appears to be doing slightly worse with regard to his wound upon evaluation today. Is measuring a little deeper and a little larger unfortunately. I am not really sure exactly what is causing this to enlarge he actually did see his dermatologist she is going to see about initiating Humira for him. Subsequently she also did do steroid injections into the wound itself in the periphery. Nonetheless still nonetheless he seems to be getting a little bit larger he is gone back to just using the steroid cream topically which I think is appropriate. I would say hold off on the collagen for the time being is definitely a good thing to do. Based on the culture results which we finally did get the final result back regarding it shows staph as the bacteria noted again that can be a normal skin bacteria based on the fact however he is having increased drainage and worsening of the wound measurement wise I would go ahead and place him on an antibiotic today I do believe for this. 02/15/2019 on evaluation today patient actually appears to be doing somewhat better in regard to his ulcer. There is no signs of worsening at this time I did review his culture results which showed evidence of Staphylococcus aureus but not MRSA. Again this could just be more related to the normal skin  bacteria although he states the drainage has slowed down quite a bit he may have had a mild infection not just colonization. And was much smaller and then since around10/04/2019 on evaluation today patient appears to be doing unfortunately worse as far as the size of the wound. I really feel like that this is steadily getting larger again it had been doing excellent right at the beginning of September we have seen a steady increase in the area of the wound it is almost 2-1/2 times the size it was on September 1. Obviously this is a bad trend this is not wanting to see. For that reason we went back to using just the topical triamcinolone cream which does seem to help with inflammation. I checked him for bacteria by way of culture and nothing showed positive there. I am considering giving him a short course of a tapering steroid Dosepak today to see if that is can be beneficial for him. The patient is in agreement with giving that a try. 03/08/2019 on evaluation today patient appears to be doing very well in comparison to last evaluation with regard to his lower extremity ulcer. This is showing signs of less inflammation and actually measuring slightly smaller compared to last time every other week over the past month and a half he has been measuring larger larger larger. Nonetheless I do believe that the issue has been inflammation the prednisone does seem to [facility], Kaydyn ([id number]) have been beneficial for him which is good news. No fevers, chills, nausea, vomiting, or diarrhea. 03/22/2019 on evaluation today patient appears to be doing about the same with regard to his leg ulcer. He has been tolerating the dressing changes without complication. With that being said the wound seems to be mostly arrested at its current size but really is not making any progress except for when we prescribed the prednisone. He did show some signs of dropping as  far as the overall size of the wound during that interval  week. Nonetheless this is something he is not on long-term at this point and unfortunately I think he is getting need either this or else the Humira which his dermatologist has discussed try to get approval for. With that being said he will be seeing his dermatologist on the 11th of this month that is November. 04/19/2019 on evaluation today patient appears to be doing really about the same the wound is measuring slightly larger compared to last time I saw him. He has not been into the office since November 2 due to the fact that he unfortunately had Covid as that his entire family. He tells me that it was rough but they did pull-through and he seems to be doing much better. Fortunately there is no signs of active infection at this time. No fevers, chills, nausea, vomiting, or diarrhea. 05/10/2019 on evaluation today patient unfortunately appears to be doing significantly worse as compared to last time I saw him. He does tell me that he has had his first dose of Humira and actually is scheduled to get the next one in the upcoming week. With that being said he tells me also that in the past several days he has been having a lot of issues with green drainage she showed me a picture this is more blue-green in color. He is also been having issues with increased sloughy buildup and the wound does appear to be larger today. Obviously this is not the direction that we want everything to take based on the starting of his Humira. Nonetheless I think this is definitely a result of likely infection and to be honest I think this is probably Pseudomonas causing the infection based on what I am seeing. 05/24/2019 on evaluation today patient unfortunately appears to be doing significantly worse compared to his prior evaluation with me 2 weeks ago. I did review his culture results which showed that he does have Staph aureus as well as Pseudomonas noted on the culture. Nonetheless the Levaquin that I prescribed for him  does not appear to have been appropriate and in fact he tells me he is no longer experiencing the green drainage and discharge that he had at the last visit. Fortunately there is no signs of active infection at this time which is good news although the wound has significantly worsened it in fact is much deeper than it was previous. We have been utilizing up to this point triamcinolone ointment as the prescription topical of choice but at this time I really feel like that the wound is getting need to be packed in order to appropriately manage this due to the deeper nature of the wound. Therefore something along the lines of an alginate dressing may be more appropriate. 05/31/2019 upon inspection today patient's wound actually showed signs of doing poorly at this point. Unfortunately he just does not seem to be making any good progress despite what we have tried. He actually did go ahead and pick up the Cipro and start taking that as he was noticing more green drainage he had previously completed the Levaquin that I prescribed for him as well. Nonetheless he missed his appointment for the seventh last week on Wednesday with the wound care center and [facility] where his dermatologist referred him. Obviously I do think a second opinion would be helpful at this point especially in light of the fact that the patient seems to be doing so poorly despite  the fact that we have tried everything that I really know how at this point. The only thing that ever seems to have helped him in the past is when he was on high doses of continual steroids that did seem to make a difference for him. 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 06/07/2019 upon evaluation today patient unfortunately appears to be doing worse yet again with regard to his wound. In fact I  am starting to question whether or not he may have a fluid pocket in the muscle at this point based on the bulging and the soft appearance to the central portion of the muscle area. There is not anything draining from the muscle itself at this time which is good news but nonetheless the wound is expanding. I am not really seeing any results of the Humira as far as overall wound progression based on what I am seeing at this point. The patient has been referred for second opinion with regard to his wound to the [facility] wound care center by his dermatologist which I definitely am not in opposition to. Unfortunately we tried multiple dressings in the past including collagen, alginate, and at one point even Hydrofera Blue. With that being said he is never really used it for any significant amount of time due to the fact that he often complains of pain associated with these dressings and then will go back to either using the Santyl which she has done intermittently or more frequently the triamcinolone. He is also using his own compression stockings. We have wrapped him in the past but again that was something else that he really was not a big fan of. 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 He seemed to do much better when he was on the steroids although honestly I understand that the steroids are not necessarily the best medication to be on long-term obviously 06/14/2019 on evaluation today patient appears to be doing actually a little bit better with regard to the overall appearance with his leg. Unfortunately he does continue to have issues with what appears to be some fluid underneath  the muscle although he did see the wound specialty center at [facility] last week their main goals were to see about infusion therapy in place of the Humira as they feel like that is not quite strong enough. They also recommended that we continue with the treatment otherwise as we are they felt like that was appropriate and they are okay with him continuing to follow-up here with [place] in that regard. With that being said they are also sending him to the vein specialist there to see about vein stripping and if that would be of benefit for him. Subsequently they also did not really address whether or not an ultrasound of the muscle area to see if there is anything that needs to be addressed here would be appropriate or not. For that reason I discussed this with him last week I  think we may proceed down that road at this point. 06/21/2019 upon evaluation today patient's wound actually appears to be doing slightly better compared to previous evaluations. I do believe that he has made a difference with regard to the progression here with the use of oral steroids. Again in the past has been the only thing that is really calm things down. He does tell me that from [facility] is gotten a good news from there that there are no further vein stripping that is necessary at this point. I do not have that available for review today although the patient did relay this to me. He also did obtain and have the ultrasound of the wound completed which I did sign off on today. It does appear that there is no fluid collection under the muscle this is likely then just edematous tissue in general. That is also good news. Overall I still believe the inflammation is the main issue here. He did inquire about the possibility of a wound VAC again with the muscle protruding like it is I am not really sure whether the wound VAC is necessarily ideal or not. That is something we will have to consider although I do believe he may need compression  wrapping to try to help with edema control which could potentially be of benefit. 06/28/2019 on evaluation today patient appears to be doing slightly better measurement wise although this is not terribly smaller he least seems to be trending towards that direction. With that being said he still seems to have purulent drainage noted in the wound bed at this time. He has been on Levaquin followed by Cipro over the past month. Unfortunately he still seems to have some issues with active infection at this time. I did perform a culture last week in order to evaluate and see if indeed there was still anything going on. Subsequently the culture did come back showing Pseudomonas which is consistent with the drainage has been having which is blue-green in color. He also has had an odor that again was somewhat consistent with Pseudomonas as well. Long story short it appears that the culture showed an intermediate finding with regard to how well the Cipro will work for the Pseudomonas infection. Subsequently being that he does not seem to be clearing up and at best what we are doing is just keeping this at [place] I think he may need to see infectious disease to discuss IV antibiotic options. NICHOALS, HEYDE ([id number]) 07/05/2019 upon evaluation today patient appears to be doing okay in regard to his leg ulcer. He has been tolerating the dressing changes at this point without complication. Fortunately there is no signs of active infection at this time which is good news. No fevers, chills, nausea, vomiting, or diarrhea. With that being said he does have an appointment with infectious disease tomorrow and his primary care on Wednesday. Again the reason for the infectious disease referral was due to the fact that he did not seem to be fully resolving with the use of oral antibiotics and therefore we were thinking that IV antibiotic therapy may be necessary secondary to the fact that there was an intermediate finding for  how effective the Cipro may be. Nonetheless again he has been having a lot of purulent and even green drainage. Fortunately right now that seems to have calmed down over the past week with the reinitiation of the oral antibiotic. Nonetheless we will see what Dr. Megan Salon has to say. 07/12/2019 upon evaluation today patient appears  to be doing about the same at this point in regard to his left lower extremity ulcer. Fortunately there is no signs of active infection at this time which is good news I do believe the Levaquin has been beneficial I did review Dr. Hale Bogus note and to be honest I agree that the patient's leg does appear to be doing better currently. What we found in the past as he does not seem to really completely resolve he will stop the antibiotic and then subsequently things will revert back to having issues with blue-green drainage, increased pain, and overall worsening in general. Obviously that is the reason I sent him back to infectious disease. 07/19/2019 upon evaluation today patient appears to be doing roughly the same in size there is really no dramatic improvement. He has started back on the Levaquin at this point and though he seems to be doing okay he did still have a lot of blue/green drainage noted on evaluation today unfortunately. I think that this is still indicative more likely of a Pseudomonas infection as previously noted and again he does see Dr. Megan Salon in just a couple of days. I do not know that were really able to effectively clear this with just oral antibiotics alone based on what I am seeing currently. Nonetheless we are still continue to try to manage as best we can with regard to the patient and his wound. I do think the wrap was helpful in decreasing the edema which is excellent news. No fevers, chills, nausea, vomiting, or diarrhea. 07/26/2019 upon evaluation today patient appears to be doing slightly better with regard to the overall appearance of the muscle  there is no dark discoloration centrally. Fortunately there is no signs of active infection at this time. No fevers, chills, nausea, vomiting, or diarrhea. Patient's wound bed currently the patient did have an appointment with Dr. Megan Salon at infectious disease last week. With that being said Dr. Megan Salon the patient states was still somewhat hesitant about put him on any IV antibiotics he wanted [place] to repeat cultures today and then see where things go going forward. He does look like Dr. Megan Salon because of some improvement the patient did have with the Levaquin wanted [place] to see about repeating cultures. If it indeed grows the Pseudomonas again then he recommended a possibility of considering a PICC line placement and IV antibiotic therapy. He plans to see the patient back in 1 to 2 weeks. 08/02/2019 upon evaluation today patient appears to be doing poorly with regard to his left lower extremity. We did get the results of his culture back it shows that he is still showing evidence of Pseudomonas which is consistent with the purulent/blue-green drainage that he has currently. Subsequently the culture also shows that he now is showing resistance to the oral fluoroquinolones which is unfortunate as that was really the only thing to treat the infection prior. I do believe that he is looking like this is going require IV antibiotic therapy to get this under control. Fortunately there is no signs of systemic infection at this time which is good news. The patient does see Dr. Megan Salon tomorrow. 08/09/2019 upon evaluation today patient appears to be doing better with regard to his left lower extremity ulcer in regard to the overall appearance. He is currently on IV antibiotic therapy. As ordered by Dr. Megan Salon. Currently the patient is on ceftazidime which she is going to take for the next 2 weeks and then follow-up for 4 to 5-week appointment with  Dr. Megan Salon. The patient started this this past Friday  symptoms have not for a total of 3 days currently in full. 08/16/2019 upon evaluation today patient's wound actually does show muscle in the base of the wound but in general does appear to be much better as far as the overall evidence of infection is concerned. In fact I feel like this is for the most part cleared up he still on the IV antibiotics he has not completed the full course yet but I think he is doing much better which is excellent news. 08/23/2019 upon evaluation today patient appears to be doing about the same with regard to his wound at this point. He tells me that he still has pain unfortunately. Fortunately there is no evidence of systemic infection at this time which is great news. There is significant muscle protrusion. 09/13/19 upon evaluation today patient appears to be doing about the same in regard to his leg unfortunately. He still has a lot of drainage coming from the ulceration there is still muscle exposed. With that being said the patient's last wound culture still showed an intermediate finding with regard to the Pseudomonas he still having the bluish/green drainage as well. Overall I do not know that the wound has completely cleared of infection at this point. Fortunately there is no signs of active infection systemically at this point which is good news. 09/20/2019 upon evaluation today patient's wound actually appears to be doing about the same based on what I am seeing currently. I do not see any signs of systemic infection he still does have evidence of some local infection and drainage. He did see Dr. Megan Salon last week and Dr. Megan Salon states that he probably does need a different IV antibiotic although he does not want to put him on this until the patient begins the Remicade infusion which is actually scheduled for about 10 days out from today on 13 May. Following that time Dr. Megan Salon is good to see him back and then will evaluate the feasibility of starting him on the  IV antibiotic therapy once again at that point. I do not disagree with this plan I do believe as Dr. Megan Salon stated in his note that I reviewed today that the patient's issue is multifactorial with the pyoderma being 1 aspect of this that were hoping the Remicade will be helpful for her. In the meantime I think the gentamicin is, helping to keep things under decent okay control in regard to the ulcer. 09/27/2019 upon evaluation today patient appears to be doing about the same with regard to his wound still there is a lot of muscle exposure though he does have some hyper granulation tissue noted around the edge and actually some granulation tissue starting to form over the muscle which is actually good news. Fortunately there is no evidence of active infection which is also good news. His pain is less at this point. 5/21; this is a patient I have not seen in a long time. 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 He has a large wound on the left lateral leg with protruding muscle. He comes in the clinic today showing the same area on his left medial ankle. He says there is been a spot there for some time although we have not previously defined this. Today he has a clearly defined area with slight amount of skin breakdown surrounded by raised areas with a purplish hue in color. This is not painful he says it  is irritated. 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 He still has muscle protruding from the lateral portion of his left leg but fortunately the new area that they were concerned about at his last visit does not appear to have opened at this point. He is currently on Remicade infusions and seems to be doing better in my opinion in fact the wound itself seems to be overall much better. The purplish discoloration that he did have seems to have resolved  and I think that is a good sign that hopefully the Remicade is doing its job. He does have some biofilm noted over the surface of the wound. 11/01/2019 on evaluation today patient's wound actually appears to be doing excellent at this time. Fortunately there is no evidence of active infection and overall I feel like he is making great progress. The Remicade seems to be due excellent job in my opinion. GLENMORE, KARL ([id number]) 11/08/19 evaluation today vision actually appears to be doing quite well with regard to his weight ulcer. He's been tolerating dressing changes without complication. Fortunately there is no evidence of infection. No fevers, chills, nausea, or vomiting noted at this time. Overall states that is having more itching than pain which is actually a good sign in my opinion. 12/13/2019 upon evaluation today patient appears to be doing well today with regard to his wound. He has been tolerating the dressing changes without complication. Fortunately there is no sign of active infection at this time. No fevers, chills, nausea, vomiting, or diarrhea. Overall I feel like the infusion therapy has been very beneficial for him. 01/06/2020 on evaluation today patient appears to be doing well with regard to his wound. This is measuring smaller and actually looks to be doing better. Fortunately there is no signs of active infection at this point. No fevers, chills, nausea, vomiting, or diarrhea. With that being said he does still have the blue-green drainage but this does not seem to be causing any significant issues currently. He has been using the gentamicin that does seem to be keeping things under decent control at this point. He goes later this morning for his next infusion therapy for the pyoderma which seems to also be very beneficial. 02/07/2020 on evaluation today patient appears to be doing about the same in regard to his wounds currently. Fortunately there is no signs of active infection  systemically he does still have evidence of local infection still using gentamicin. He also is showing some signs of improvement albeit slowly I do feel like we are making some progress here. 02/21/2020 upon evaluation today patient appears to be making some signs of improvement the wound is measuring a little bit smaller which is great news and overall I am very pleased with where he stands currently. He is going to be having infusion therapy treatment on the 15th of this month. Fortunately there is no signs of active infection at this time. Electronic Signature(s) Signed: 02/21/2020 3:18:36 PM By: Worthy Keeler PA-C Entered By: Worthy Keeler on 02/21/2020 15:18:35 BHAVIN, MONJARAZ ([id number]) -------------------------------------------------------------------------------- Physical Exam Details Patient Name: [name] Date of Service: 02/21/2020 8:15 AM Medical Record Number: [id number] Patient Account Number: [id number] Date of Birth/Sex: Dec 19, 1980 (39 y.o. M) Treating RN: Army Melia Primary Care Provider: [name] Other Clinician: Referring Provider: [name] Treating Provider/Extender: STONE III, Hillel Card Weeks in Treatment: 74 Constitutional Well-nourished and well-hydrated in no acute distress. Respiratory normal  breathing without difficulty. Psychiatric this patient is able to make decisions and demonstrates good insight into disease process. Alert and Oriented x 3. pleasant and cooperative. Notes Upon inspection patient's wound bed actually showed signs of good granulation at this time. There does not appear to be any evidence of active infection which is great news overall I am extremely pleased with where things stand. I think he is showing signs of excellent improvement overall. Electronic Signature(s) Signed: 02/21/2020 3:19:03 PM By: Worthy Keeler PA-C Entered By: Worthy Keeler on 02/21/2020 15:19:03 OLUWAFERANMI, WAIN  ([id number]) -------------------------------------------------------------------------------- Physician Orders Details Patient Name: [name] Date of Service: 02/21/2020 8:15 AM Medical Record Number: [id number] Patient Account Number: [id number] Date of Birth/Sex: 1981/04/16 (39 y.o. 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 Anesthetic (add to Medication List) Wound #1 Left,Lateral Lower Leg o Topical Lidocaine 4% cream applied to wound bed prior to debridement (In Clinic Only). Primary Wound Dressing o Gentamicin Sulfate Cream - mix 1:1 with triamcinolone to apply to the wound bed Secondary Dressing Wound #1 Left,Lateral Lower Leg o Boardered Foam Dressing Dressing Change Frequency Wound #1 Left,Lateral Lower Leg o Change dressing every day. Follow-up Appointments Wound #1 Left,Lateral Lower Leg o Return Appointment in 3 weeks. Edema Control Wound #1 Left,Lateral Lower Leg o Patient to wear own compression stockings o Elevate legs to the level of the heart and pump ankles as often as possible Electronic Signature(s) Signed: 02/21/2020 11:04:42 AM By: Army Melia Signed: 02/21/2020 3:57:50 PM By: Worthy Keeler PA-C Entered By: Army Melia on 02/21/2020 09:02:50 [name] ([id number]) -------------------------------------------------------------------------------- Problem List  Details Patient Name: [name] Date of Service: 02/21/2020 8:15 AM Medical Record Number: [id number] Patient Account Number: [id number] Date of Birth/Sex: [birth year] (39 y.o. M) Treating RN: Army Melia Primary Care Provider: [name] Other Clinician: Referring Provider: [name] Treating Provider/Extender: Melburn Hake, Oluwatimileyin Vivier Weeks in Treatment: 167 Active Problems ICD-10 Encounter Code Description Active Date MDM Diagnosis L97.222 Non-pressure chronic ulcer of left calf with fat layer exposed 12/04/2016 No Yes L88 Pyoderma gangrenosum 03/26/2017 No Yes L97.321 Non-pressure chronic ulcer of left ankle limited to breakdown of skin 10/08/2019 No Yes I87.2 Venous insufficiency (chronic) (peripheral) 12/04/2016 No Yes L03.116 Cellulitis of left lower limb 05/24/2019 No Yes Inactive Problems ICD-10 Code Description Active Date Inactive Date L97.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 M) Treating RN: Army Melia Primary Care Provider: [name] Other Clinician: Referring Provider: [name] Treating Provider/Extender: Melburn Hake, Carleton [place] Weeks in Treatment: 167 Subjective Chief Complaint Information obtained from Patient He is here in follow up evaluation for LLE pyoderma ulcer History of Present Illness (HPI) 12/04/16; 39 year old man who comes into the clinic today for review of  a wound on the posterior left calf. He tells me that is been there for about a year. He is  not a diabetic he does smoke half a pack per day. He was seen in the ER on 11/20/16 felt to have cellulitis around the wound and was given clindamycin. An x-ray did not show osteomyelitis. The patient initially tells me that he has a milk allergy that sets off a pruritic itching rash on his lower legs which she scratches incessantly and he thinks that's what may have set up the wound. He has been using various topical antibiotics and ointments without any effect. He works in a [employer] and is on his feet all day. He does not have a prior history of wounds however he does have the rash on both lower legs the right arm and the ventral aspect of his left arm. These are excoriations and clearly have had scratching however there are of macular looking areas on both legs including a substantial larger area on the right leg. This does not have an underlying open area. There is no blistering. The patient tells me that 2 years ago in [place] in response to the rash on his legs he saw a dermatologist who told him he had a condition which may be pyoderma gangrenosum although I may be putting words into his mouth. He seemed to recognize this. 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 He is not in any treatment for this. He's had no recent travel 12/11/16; the patient arrives today with his wound and roughly the same condition we've been using silver alginate this is a deep punched out wound with some surrounding erythema but no tenderness. Biopsy I did did not show confirmed pyoderma gangrenosum suggested nonspecific inflammation and vasculitis but does not provide an actual description of what was seen by the pathologist. I'm really not able to understand this We have also received information from the patient's dermatologist in [place] notes from April 2016. This was a doctor Agarwal-antal. 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 He was prescribed  topical steroid high potency under occlusion which helped but at this point the patient did not have a deep punched out wound. 12/18/16; the patient's wound is larger in terms of surface area however this surface looks better and there is less depth. The surrounding erythema also is better. The patient states that the wrap we put on came off 2 days ago when he has been using his compression stockings. He we are in the process of getting a dermatology consult. 12/26/16 on evaluation today patient's left lower extremity wound shows evidence of infection with surrounding erythema noted. He has been tolerating the dressing changes but states that he has noted more discomfort. There is a larger area of erythema surrounding the wound. No fevers, chills, nausea, or vomiting noted at this time. With that being said the wound still does have slough covering the surface. He is not allergic to any medication that he is aware of at this point. In regard to his right lower extremity he had several regions that are erythematous and pruritic he wonders if there's anything we can do to help that. 01/02/17 I reviewed patient's wound culture which was obtained his visit last week. He was placed on doxycycline at that point. Unfortunately that does not appear to be an antibiotic that would likely help with the situation however the pseudomonas noted on culture is sensitive to Cipro. Also  unfortunately patient's wound seems to have a large compared to last week's evaluation. Not severely so but there are definitely increased measurements in general. He is continuing to have discomfort as well he writes this to be a seven out of 10. In fact he would prefer me not to perform any debridement today due to the fact that he is having discomfort and considering he has an active infection on the little reluctant to do so anyway. No fevers, chills, nausea, or vomiting noted at this time. 01/08/17; patient seems dermatology on  September 5. I suspect dermatology will want the slides from the biopsy I did sent to their pathologist. I'm not sure if there is a way we can expedite that. In any case the culture I did before I left on vacation 3 weeks ago showed Pseudomonas he was given 10 days of Cipro and per her description of her intake nurses is actually somewhat better this week although the wound is quite a bit bigger than I remember the last time I saw this. He still has 3 more days of Cipro 01/21/17; dermatology appointment tomorrow. He has completed the ciprofloxacin for Pseudomonas. Surface of the wound looks better however he is had some deterioration in the lesions on his right leg. Meantime the left lateral leg wound we will continue with sample 01/29/17; patient had his dermatology appointment but I can't yet see that note. He is completed his antibiotics. The wound is more superficial but considerably larger in circumferential area than when he came in. This is in his left lateral calf. He also has swollen erythematous areas with superficial wounds on the right leg and small papular areas on both arms. There apparently areas in her his upper thighs and buttocks I did not look at those. Dermatology biopsied the right leg. Hopefully will have their input next week. 02/05/17; patient went back to see his dermatologist who told him that he had a "scratching problem" as well as staph. He is now on a 30 day course of doxycycline and I believe she gave him triamcinolone cream to the right leg areas to help with the itching [not exactly sure but probably triamcinolone]. She apparently looked at the left lateral leg wound although this was not rebiopsied and I think felt to be ultimately part of the same pathogenesis. He is using sample border foam and changing nevus himself. He now has a new open area on the right posterior leg which was his biopsy site I don't have any of the dermatology notes 02/12/17; we put the patient in  compression last week with SANTYL to the wound on the left leg and the biopsy. Edema is much better and the depth of the wound is now at level of skin. Area is still the same Biopsy site on the right lateral leg we've also been using santyl with a border foam dressing and he is changing this himself. 02/19/17; Using silver alginate started last week to both the substantial left leg wound and the biopsy site on the right wound. He is tolerating compression well. Has a an appointment with his primary M.D. tomorrow wondering about diuretics although I'm wondering if the edema problem is actually lymphedema NINO, AMANO ([id number]) 02/26/17; the patient has been to see his primary doctor Dr. Jerrel Ivory at [facility] our primary care. She started him on Lasix 20 mg and this seems to have helped with the edema. However we are not making substantial change with the left lateral calf wound and inflammation.  The biopsy site on the right leg also looks stable but not really all that different. 03/12/17; the patient has been to see vein and vascular Dr. Lucky Cowboy. He has had venous reflux studies I have not reviewed these. I did get a call from his dermatology office. 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 They therefore recommended clobetasol and tetracycline i.e. 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 03/26/17; apparently the patient just had reflux studies not an appointment with Dr. dew. She arrives in clinic today having applied clobetasol for 2-3 weeks. He notes over the last 2-3 days excessive drainage having to change the dressing 3-4 times a day and also expanding erythema. He states the expanding erythema seems to come and  go and was last this red was earlier in the month.he is on doxycycline 150 mg twice a day as an anti-inflammatory systemic therapy for possible pyoderma gangrenosum along with the topical clobetasol 04/02/17; the patient was seen last week by Dr. Lillia Carmel at [facility] dermatology locally who kindly saw him at my request. 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 My concern was the degree of erythema medially extending from his left leg wound which was either inflammation from pyoderma or cellulitis. I put him on Augmentin however culture of the wound showed Pseudomonas which is quinolone sensitive. I really don't believe he has cellulitis however in view of everything I will continue and give him a course of Cipro. He is also on doxycycline as an immune modulator for the pyoderma. In addition to his original wound on the left lateral leg with surrounding erythema he has a wound on the right posterior calf which was an original biopsy site done by dermatology. 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 Saw Dr. Lillia Carmel yesterday. He has been using topical antibiotics to both wound areas his original wound on the left and the biopsies/pathergy area on the right. There is definitely some improvement in the inflammation around the wound on the right although the patient states he has increasing sensitivity of the wounds. He is on prednisone 60 and doxycycline 1 as prescribed by Dr. Lillia Carmel. He is covering the topical antibiotic with gauze and putting this in his own compression stocks and changing this daily. He states that Dr. Lottie Rater did a culture of the left leg wound yesterday 05/07/17; pyoderma gangrenosum. The patient saw Dr. Lillia Carmel yesterday and has a follow-up with her in one month. He is still using topical antibiotics to both wounds although he can't recall exactly what  type. He is still on prednisone 60 mg. Dr. Lillia Carmel stated that the doxycycline could stop if we were in agreement. 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 The right medial leg was induced by biopsy/pathergy. The area on the right is essentially healed. 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 The right medial leg has closed and remains closed. He is still on prednisone 60. He tells me he missed his last dermatology appointment with Dr.  Pearlstein but will make another appointment. He reports that her blood sugar at a recent screen in [place] was high 200's. He was 180 today. 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 I think he is going to require still much longer prednisone perhaps another 3 months before attempting to taper. In the meantime his wound is a lot better. Smaller. He is cleaning this off daily and applying topical antibiotics. When he was last in the clinic I thought about changing to [facility] and actually put in a couple of calls to dermatology although probably not during their business hours. 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 He continues on oral prednisone. He has been using triple antibiotic ointment. There is surface debris and we will transition to [facility] and have him return in 2 weeks. He has lost 30 pounds since his last appointment with lifestyle modification. He may benefit from topical steroid cream for treatment this can be considered at a later date. 08/22/17 on evaluation today patient appears to actually be doing rather well in regard to his left lateral lower extremity ulcer. He has actually been managed by Dr. Dellia Nims most  recently. Patient is currently on oral steroids at this time. This seems to have been of benefit for him. Nonetheless his last visit was actually with Leah on 08/06/17. Currently he is not utilizing any topical steroid creams although this could be of benefit as well. No fevers, chills, nausea, or vomiting noted at this time. 09/05/17 on evaluation today patient appears to be doing better in regard to his left lateral lower extremity ulcer. He has been tolerating the dressing changes without complication. He is using Santyl with good effect. Overall I'm very pleased with how things are standing at this point. Patient likewise is happy that this is doing better. 09/19/17 on evaluation today patient actually appears to be doing rather well in regard to his left lateral lower extremity ulcer. 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 He's not having any significant pain. 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 He has been tolerating the Santyl without complication and in general I feel like he's making good progress. 10/17/17 on evaluation today patient appears to be doing very well in regard to his left lateral lower surety ulcer. He has been tolerating the dressing changes without complication. There does not appear to be any evidence of infection he's alternating the Santyl and the triple antibiotic ointment every other day this seems to be doing well for him. 11/03/17 on evaluation today patient appears to be doing very well in regard to his left lateral lower extremity ulcer. He is been tolerating the dressing changes without complication which is good news. Fortunately there does not appear to be any evidence of infection which is also great news. Overall is doing excellent they are starting to taper down on the  prednisone is down to 40 mg at this point it also started topical clobetasol for him. 11/17/17 on evaluation today patient appears to be doing well in regard to his left lateral lower surety ulcer. He's been tolerating the dressing changes without complication. He does note that he is having no  pain, no excessive drainage or discharge, and overall he feels like things are going about how he would expect and hope they would. Overall he seems to have no evidence of infection at this time in my opinion which is good news. 12/04/17-He is seen in follow-up evaluation for right lateral lower extremity ulcer. He has been applying topical steroid cream. Today's measurement show slight increase in size. Over the next 2 weeks we will transition to every other day Santyl and steroid cream. He has been encouraged to monitor for changes and notify clinic with any concerns 12/15/17 on evaluation today patient's left lateral motion the ulcer and fortunately is doing worse again at this point. This just since last week to this week has close to doubled in size according to the patient. I did not seeing last week's I do not have a visual to compare this to in our system was also down so we do not have all the charts and at this point. Nonetheless it does have me somewhat concerned in regard to the fact that again he was worried enough about it he has contact the dermatology that placed them back on the full strength, 50 mg a day of the prednisone that he was taken previous. He continues to alternate using clobetasol along with Santyl at this point. He is obviously somewhat frustrated. MANAV, PIEROTTI ([id number]) 12/22/17 on evaluation today patient appears to be doing a little worse compared to last evaluation. Unfortunately the wound is a little deeper and slightly larger than the last week's evaluation. With that being said he has made some progress in regard to the irritation surrounding at this time unfortunately  despite that progress that's been made he still has a significant issue going on here. 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 For that reason I discussed with him today the possibility of perform a wound culture to ensure there's nothing overtly infected. 01/06/18 on evaluation today patient's wound is larger and deeper than previously evaluated. With that being said it did appear that his wound was infected after my last evaluation with him. Subsequently I did end up prescribing a prescription for Bactrim DS which she has been taking and having no complication with. Fortunately there does not appear to be any evidence of infection at this point in time as far as anything spreading, no want to touch, and overall I feel like things are showing signs of improvement. 01/13/18 on evaluation today patient appears to be even a little larger and deeper than last time. There still muscle exposed in the base of the wound. Nonetheless he does appear to be less erythematous I do believe inflammation is calming down also believe the infection looks like it's probably resolved at this time based on what I'm seeing. No fevers, chills, nausea, or vomiting noted at this time. 01/30/18 on evaluation today patient actually appears to visually look better for the most part. Unfortunately those visually this looks better he does seem to potentially have what may be an abscess in the muscle that has been noted in the central portion of the wound. This is the first time that I have noted what appears to be fluctuance in the central portion of the muscle. With that being said I'm somewhat more concerned about the fact that this might indicate an abscess formation at this location. I do believe that  an ultrasound would be appropriate. This is likely something we need to try  to do as soon as possible. He has been switch to mupirocin ointment and he is no longer using the steroid ointment as prescribed by dermatology he sees them again next week he's been decreased from 60 to 40 mg of prednisone. 03/09/18 on evaluation today patient actually appears to be doing a little better compared to last time I saw him. There's not as much erythema surrounding the wound itself. He I did review his most recent infectious disease note which was dated 02/24/18. He saw Dr. Michel Bickers in [place]. 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 Patient is now off of antibiotics and they are continually observing this. There seems to be no change in the past two weeks in my pinion based on what the patient says and what I see today compared to what Dr. Megan Salon likely saw two weeks ago. No fevers, chills, nausea, or vomiting noted at this time. 03/23/18 on evaluation today patient's wound actually appears to be showing signs of improvement which is good news. He is currently still on the Dapsone. He is also working on tapering the prednisone to get off of this and Dr. Lottie Rater is working with him in this regard. Nonetheless overall I feel like the wound is doing well it does appear based on the infectious disease note that I reviewed from Dr. 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 I did also review the results of his ultrasound of left lower extremity which revealed there was a dentist tissue in the base of the wound without an abscess noted. 04/06/18 on evaluation today the patient's left lateral lower extremity ulcer actually appears to be doing fairly well which is excellent news. There does not appear to be any evidence of infection at this time which is also great news. Overall he  still does have a significantly large ulceration although little by little he seems to be making progress. He is down to 10 mg a day of the prednisone. 04/20/18 on evaluation today patient actually appears to be doing excellent at this time in regard to his left lower extremity ulcer. He's making signs of good progress unfortunately this is taking much longer than we would really like to see but nonetheless he is making progress. Fortunately there does not appear to be any evidence of infection at this time. No fevers, chills, nausea, or vomiting noted at this time. The patient has not been using the Santyl due to the cost he hadn't got in this field yet. He's mainly been using the antibiotic ointment topically. Subsequently he also tells me that he really has not been scrubbing in the shower I think this would be helpful again as I told him it doesn't have to be anything too aggressive to even make it believe just enough to keep it free of some of the loose slough and biofilm on the wound surface. 05/11/18 on evaluation today patient's wound appears to be making slow but sure progress in regard to the left lateral lower extremity ulcer. He is been tolerating the dressing changes without complication. Fortunately there does not appear to be any evidence of infection at this time. He is still just using triple antibiotic ointment along with  clobetasol occasionally over the area. He never got the Santyl and really does not seem to intend to in my pinion. 06/01/18 on evaluation today patient appears to be doing a little better in regard to his left lateral lower extremity ulcer. He states that overall he does not feel like he is doing as well with the Dapsone as he did with the prednisone. Nonetheless he sees his dermatologist later today and is gonna talk to them about the possibility of going back on the prednisone. Overall again I believe that the wound would be better if you would utilize Santyl but he  really does not seem to be interested in going back to the [place] at this point. He has been using triple antibiotic ointment. 06/15/18 on evaluation today patient's wound actually appears to be doing about the same at this point. Fortunately there is no signs of infection at this time. He has made slight improvements although he continues to not really want to clean the wound bed at this point. He states that he just doesn't mess with it he doesn't want to cause any problems with everything else he has going on. He has been on medication, antibiotics as prescribed by his dermatologist, for a staff infection of his lower extremities which is really drying out now and looking much better he tells me. Fortunately there is no sign of overall infection. 06/29/18 on evaluation today patient appears to be doing well in regard to his left lateral lower surety ulcer all things considering. Fortunately his staff infection seems to be greatly improved compared to previous. He has no signs of infection and this is drying up quite nicely. He is still the doxycycline for this is no longer on cental, Dapsone, or any of the other medications. His dermatologist has recommended possibility of an infusion but right now he does not want to proceed with that. 07/13/18 on evaluation today patient appears to be doing about the same in regard to his left lateral lower surety ulcer. Fortunately there's no signs of infection at this time which is great news. Unfortunately he still builds up a significant amount of Slough/biofilm of the surface of the wound he still is not really cleaning this as he should be appropriately. Again I'm able to easily with saline and gauze remove the majority of this on the surface which if you would do this at home would likely be a dramatic improvement for him as far as getting the area to improve. Nonetheless overall I still feel like he is making progress is just very slow. I think Santyl will be  of benefit for him as well. Still he has not gotten this as of this point. 07/27/18 on evaluation today patient actually appears to be doing little worse in regards of the erythema around the periwound region of the wound he also tells me that he's been having more drainage currently compared to what he was experiencing last time I saw him. He states not quite as bad as what he had because this was infected previously but nonetheless is still appears to be doing poorly. Fortunately there is no evidence of systemic infection at this point. The patient tells me that he is not going to be able to afford the Santyl. He is still waiting to hear about the infusion therapy with his dermatologist. Apparently she wants an updated colonoscopy first. CAMBELL, STANEK ([id number]) 08/10/18 on evaluation today patient appears to be doing better in regard to his left lateral lower extremity  ulcer. Fortunately he is showing signs of improvement in this regard he's actually been approved for Remicade infusion's as well although this has not been scheduled as of yet. Fortunately there's no signs of active infection at this time in regard to the wound although he is having some issues with infection of the right lower extremity is been seen as dermatologist for this. Fortunately they are definitely still working with him trying to keep things under control. 09/07/18 on evaluation today patient is actually doing rather well in regard to his left lateral lower extremity ulcer. He notes these actually having some hair grow back on his extremity which is something he has not seen in years. He also tells me that the pain is really not giving them any trouble at this time which is also good news overall she is very pleased with the progress he's using a combination of the mupirocin along with the probate is all mixed. 09/21/18 on evaluation today patient actually appears to be doing fairly well all things considered in regard to his  looks from the ulcer. He's been tolerating the dressing changes without complication. Fortunately there's no signs of active infection at this time which is good news he is still on all antibiotics or prevention of the staff infection. He has been on prednisone for time although he states it is gonna contact his dermatologist and see if she put them on a short course due to some irritation that he has going on currently. Fortunately there's no evidence of any overall worsening this is going very slow I think cental would be something that would be helpful for him although he states that $50 for tube is quite expensive. He therefore is not willing to get that at this point. 10/06/18 on evaluation today patient actually appears to be doing decently well in regard to his left lateral leg ulcer. He's been tolerating the dressing changes without complication. Fortunately there's no signs of active infection at this time. Overall I'm actually rather pleased with the progress he's making although it's slow he doesn't show any signs of infection and he does seem to be making some improvement. I do believe that he may need a switch up and dressings to try to help this to heal more appropriately and quickly. 10/19/18 on evaluation today patient actually appears to be doing better in regard to his left lateral lower extremity ulcer. This is shown signs of having much less Slough buildup at this point due to the fact he has been using the [employer]. Obviously this is very good news. The overall size of the wound is not dramatically smaller but again the appearance is. 11/02/18 on evaluation today patient actually appears to be doing quite well in regard to his lower [place] ulcer. A lot of the skin around the ulcer is actually somewhat irritating at this point this seems to be more due to the dressing causing irritation from the adhesive that anything else. Fortunately there is no signs of active infection at this  time. 11/24/18 on evaluation today patient appears to be doing a little worse in regard to his overall appearance of his lower extremity ulcer. There's more erythema and warmth around the wound unfortunately. He is currently on doxycycline which he has been on for some time. With that being said I'm not sure that seems to be helping with what appears to possibly be an acute cellulitis with regard to his left lower extremity ulcer. No fevers, chills, nausea, or vomiting noted  at this time. 12/08/18 on evaluation today patient's wounds actually appears to be doing significantly better compared to his last evaluation. He has been using Santyl along with alternating tripling about appointment as well as the steroid cream seems to be doing quite well and the wound is showing signs of improvement which is excellent news. Fortunately there's no evidence of infection and in fact his culture came back negative with only normal skin flora noted. 12/21/2018 upon evaluation today patient actually appears to be doing excellent with regard to his ulcer. This is actually the best that I have seen it since have been helping to take care of him. It is both smaller as well as less slough noted on the surface of the wound and seems to be showing signs of good improvement with new skin growing from the edges. He has been using just the triamcinolone he does wonder if he can get a refill of that ointment today. 01/04/2019 upon evaluation today patient actually appears to be doing well with regard to his left lateral lower extremity ulcer. With that being said it does not appear to be that he is doing quite as well as last time as far as progression is concerned. There does not appear to be any signs of infection or significant irritation which is good news. With that being said I do believe that he may benefit from switching to a collagen based dressing based on how clean The wound appears. 01/18/2019 on evaluation today  patient actually appears to be doing well with regard to his wound on the left lower extremity. He is not made a lot of progress compared to where we were previous but nonetheless does seem to be doing okay at this time which is good news. There is no signs of active infection which is also good news. My only concern currently is I do wish we can get him into utilizing the collagen dressing his insurance would not pay for the supplies that we ordered although it appears that he may be able to order this through his supply company that he typically utilizes. This is [employer]. Nonetheless he did try to order it during the office visit today and it appears this did go through. We will see if he can get that it is a different brand but nonetheless he has collagen and I do think will be beneficial. 02/01/2019 on evaluation today patient actually appears to be doing a little worse today in regard to the overall size of his wounds. Fortunately there is no signs of active infection at this time. That is visually. Nonetheless when this is happened before it was due to infection. For that reason were somewhat concerned about that this time as well. 02/08/2019 on evaluation today patient unfortunately appears to be doing slightly worse with regard to his wound upon evaluation today. Is measuring a little deeper and a little larger unfortunately. I am not really sure exactly what is causing this to enlarge he actually did see his dermatologist she is going to see about initiating Humira for him. Subsequently she also did do steroid injections into the wound itself in the periphery. Nonetheless still nonetheless he seems to be getting a little bit larger he is gone back to just using the steroid cream topically which I think is appropriate. I would say hold off on the collagen for the time being is definitely a good thing to do. Based on the culture results which we finally did get the final result  back regarding it  shows staph as the bacteria noted again that can be a normal skin bacteria based on the fact however he is having increased drainage and worsening of the wound measurement wise I would go ahead and place him on an antibiotic today I do believe for this. 02/15/2019 on evaluation today patient actually appears to be doing somewhat better in regard to his ulcer. There is no signs of worsening at this time I did review his culture results which showed evidence of Staphylococcus aureus but not MRSA. Again this could just be more related to the normal skin bacteria although he states the drainage has slowed down quite a bit he may have had a mild infection not just colonization. And was much smaller and then since around10/04/2019 on evaluation today patient appears to be doing unfortunately worse as far as the size of the wound. I really feel like that this is steadily getting larger again it had been doing excellent right at the beginning of September we have seen a steady increase in the area of the wound it is almost 2-1/2 times the size it was on September 1. Obviously this is a bad trend this is not wanting to see. For that reason we went back to using just the topical triamcinolone cream which does seem to help with inflammation. I checked him for bacteria by way of culture and nothing showed positive there. I am considering giving him a short course of a tapering steroid Isiac Breighner, Faruq ([id number]) today to see if that is can be beneficial for him. The patient is in agreement with giving that a try. 03/08/2019 on evaluation today patient appears to be doing very well in comparison to last evaluation with regard to his lower extremity ulcer. This is showing signs of less inflammation and actually measuring slightly smaller compared to last time every other week over the past month and a half he has been measuring larger larger larger. Nonetheless I do believe that the issue has been inflammation  the prednisone does seem to have been beneficial for him which is good news. No fevers, chills, nausea, vomiting, or diarrhea. 03/22/2019 on evaluation today patient appears to be doing about the same with regard to his leg ulcer. He has been tolerating the dressing changes without complication. With that being said the wound seems to be mostly arrested at its current size but really is not making any progress except for when we prescribed the prednisone. He did show some signs of dropping as far as the overall size of the wound during that interval week. Nonetheless this is something he is not on long-term at this point and unfortunately I think he is getting need either this or else the Humira which his dermatologist has discussed try to get approval for. With that being said he will be seeing his dermatologist on the 11th of this month that is November. 04/19/2019 on evaluation today patient appears to be doing really about the same the wound is measuring slightly larger compared to last time I saw him. He has not been into the office since November 2 due to the fact that he unfortunately had Covid as that his entire family. He tells me that it was rough but they did pull-through and he seems to be doing much better. Fortunately there is no signs of active infection at this time. No fevers, chills, nausea, vomiting, or diarrhea. 05/10/2019 on evaluation today patient unfortunately appears to be doing significantly worse as compared  to last time I saw him. He does tell me that he has had his first dose of Humira and actually is scheduled to get the next one in the upcoming week. With that being said he tells me also that in the past several days he has been having a lot of issues with green drainage she showed me a picture this is more blue-green in color. He is also been having issues with increased sloughy buildup and the wound does appear to be larger today. Obviously this is not the direction  that we want everything to take based on the starting of his Humira. Nonetheless I think this is definitely a result of likely infection and to be honest I think this is probably Pseudomonas causing the infection based on what I am seeing. 05/24/2019 on evaluation today patient unfortunately appears to be doing significantly worse compared to his prior evaluation with me 2 weeks ago. I did review his culture results which showed that he does have Staph aureus as well as Pseudomonas noted on the culture. Nonetheless the Levaquin that I prescribed for him does not appear to have been appropriate and in fact he tells me he is no longer experiencing the green drainage and discharge that he had at the last visit. Fortunately there is no signs of active infection at this time which is good news although the wound has significantly worsened it in fact is much deeper than it was previous. We have been utilizing up to this point triamcinolone ointment as the prescription topical of choice but at this time I really feel like that the wound is getting need to be packed in order to appropriately manage this due to the deeper nature of the wound. Therefore something along the lines of an alginate dressing may be more appropriate. 05/31/2019 upon inspection today patient's wound actually showed signs of doing poorly at this point. Unfortunately he just does not seem to be making any good progress despite what we have tried. He actually did go ahead and pick up the Cipro and start taking that as he was noticing more green drainage he had previously completed the Levaquin that I prescribed for him as well. Nonetheless he missed his appointment for the seventh last week on Wednesday with the wound care center and [facility] where his dermatologist referred him. Obviously I do think a second opinion would be helpful at this point especially in light of the fact that the patient seems to be doing so poorly despite the  fact that we have tried everything that I really know how at this point. The only thing that ever seems to have helped him in the past is when he was on high doses of continual steroids that did seem to make a difference for him. 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 06/07/2019 upon evaluation today patient unfortunately appears to be doing worse yet again with regard to his wound. In fact I am starting to question whether or not he may have a fluid pocket in the muscle at this point based on the bulging and the soft appearance to the central portion of the muscle area. There is not anything draining from the muscle itself at this time which is good news but nonetheless the wound is expanding. I am not  really seeing any results of the Humira as far as overall wound progression based on what I am seeing at this point. The patient has been referred for second opinion with regard to his wound to the [facility] wound care center by his dermatologist which I definitely am not in opposition to. Unfortunately we tried multiple dressings in the past including collagen, alginate, and at one point even Hydrofera Blue. With that being said he is never really used it for any significant amount of time due to the fact that he often complains of pain associated with these dressings and then will go back to either using the Santyl which she has done intermittently or more frequently the triamcinolone. He is also using his own compression stockings. We have wrapped him in the past but again that was something else that he really was not a big fan of. 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 He seemed to do much better when he was on the steroids although honestly I understand that the steroids are not necessarily the best medication to be on long-term obviously 06/14/2019 on evaluation today patient appears to be doing actually a little bit better with regard to the overall appearance with his leg. Unfortunately he does continue to have issues with what appears to be some fluid underneath the muscle although he did see the wound specialty center at [facility] last week their main goals were to see about infusion therapy in place of the Humira as they feel like that is not quite strong enough. They also recommended that we continue with the treatment otherwise as we are they felt like that was appropriate and they are okay with him continuing to follow-up here with [place] in that regard. With that being said they are also sending him to the vein specialist there to see about vein stripping and if that would be of benefit for him. Subsequently they also did not really address whether or not an ultrasound of the muscle area to see if there is anything that needs to be addressed here would be appropriate or not. For that reason I discussed this with him last week I think we may proceed down that road at this point. 06/21/2019 upon evaluation today patient's wound actually appears to be doing slightly better compared to previous evaluations. I do believe that he has made a difference with regard to the progression here with the use of oral steroids. Again in the past has been the only thing that is really calm things down. He does tell me that from [facility] is gotten a good news from there that there are no further vein stripping that is necessary at this point. I do not have that available for review today although the patient did relay this to me. He also did  obtain and have the ultrasound of the wound completed which I did sign off on today. It does appear that there is no fluid collection under the muscle this is likely then just edematous tissue in general. That is also good news. Overall I still believe the inflammation is the main issue here. He did inquire about the  possibility of a wound VAC again with the muscle protruding like it is I am not really sure whether the wound VAC is necessarily ideal or not. That is something we will have to consider although I do believe he may need compression wrapping to try to help with edema control which could potentially be of benefit. 06/28/2019 on evaluation today patient appears to be doing slightly better measurement wise although this is not terribly smaller he least seems to be trending towards that direction. With that being said he still seems to have purulent drainage noted in the wound bed at this time. He has been on Levaquin followed by Cipro over the past month. Unfortunately he still seems to have some issues with active infection at this time. I did perform a culture last week in order to evaluate and see if indeed there was still anything going on. Subsequently the culture did come back Carstens, Brittany ([id number]) showing Pseudomonas which is consistent with the drainage has been having which is blue-green in color. He also has had an odor that again was somewhat consistent with Pseudomonas as well. Long story short it appears that the culture showed an intermediate finding with regard to how well the Cipro will work for the Pseudomonas infection. Subsequently being that he does not seem to be clearing up and at best what we are doing is just keeping this at [place] I think he may need to see infectious disease to discuss IV antibiotic options. 07/05/2019 upon evaluation today patient appears to be doing okay in regard to his leg ulcer. He has been tolerating the dressing changes at this point without  complication. Fortunately there is no signs of active infection at this time which is good news. No fevers, chills, nausea, vomiting, or diarrhea. With that being said he does have an appointment with infectious disease tomorrow and his primary care on Wednesday. Again the reason for the infectious disease referral was due to the fact that he did not seem to be fully resolving with the use of oral antibiotics and therefore we were thinking that IV antibiotic therapy may be necessary secondary to the fact that there was an intermediate finding for how effective the Cipro may be. Nonetheless again he has been having a lot of purulent and even green drainage. Fortunately right now that seems to have calmed down over the past week with the reinitiation of the oral antibiotic. Nonetheless we will see what Dr. Megan Salon has to say. 07/12/2019 upon evaluation today patient appears to be doing about the same at this point in regard to his left lower extremity ulcer. Fortunately there is no signs of active infection at this time which is good news I do believe the Levaquin has been beneficial I did review Dr. Hale Bogus note and to be honest I agree that the patient's leg does appear to be doing better currently. What we found in the past as he does not seem to really completely resolve he will stop the antibiotic and then subsequently things will revert back to having issues with blue-green drainage, increased pain, and overall worsening in general. Obviously that is the reason I sent him back to infectious disease. 07/19/2019 upon evaluation today patient appears to be doing roughly the same in size there is really no dramatic improvement. He has started back on the Levaquin at this point and though he seems to be doing okay he did still have a lot of blue/green drainage noted on evaluation today unfortunately. I think  that this is still indicative more likely of a Pseudomonas infection as previously noted and  again he does see Dr. Megan Salon in just a couple of days. I do not know that were really able to effectively clear this with just oral antibiotics alone based on what I am seeing currently. Nonetheless we are still continue to try to manage as best we can with regard to the patient and his wound. I do think the wrap was helpful in decreasing the edema which is excellent news. No fevers, chills, nausea, vomiting, or diarrhea. 07/26/2019 upon evaluation today patient appears to be doing slightly better with regard to the overall appearance of the muscle there is no dark discoloration centrally. Fortunately there is no signs of active infection at this time. No fevers, chills, nausea, vomiting, or diarrhea. Patient's wound bed currently the patient did have an appointment with Dr. Megan Salon at infectious disease last week. With that being said Dr. Megan Salon the patient states was still somewhat hesitant about put him on any IV antibiotics he wanted [place] to repeat cultures today and then see where things go going forward. He does look like Dr. Megan Salon because of some improvement the patient did have with the Levaquin wanted [place] to see about repeating cultures. If it indeed grows the Pseudomonas again then he recommended a possibility of considering a PICC line placement and IV antibiotic therapy. He plans to see the patient back in 1 to 2 weeks. 08/02/2019 upon evaluation today patient appears to be doing poorly with regard to his left lower extremity. We did get the results of his culture back it shows that he is still showing evidence of Pseudomonas which is consistent with the purulent/blue-green drainage that he has currently. Subsequently the culture also shows that he now is showing resistance to the oral fluoroquinolones which is unfortunate as that was really the only thing to treat the infection prior. I do believe that he is looking like this is going require IV antibiotic therapy to get this under  control. Fortunately there is no signs of systemic infection at this time which is good news. The patient does see Dr. Megan Salon tomorrow. 08/09/2019 upon evaluation today patient appears to be doing better with regard to his left lower extremity ulcer in regard to the overall appearance. He is currently on IV antibiotic therapy. As ordered by Dr. Megan Salon. Currently the patient is on ceftazidime which she is going to take for the next 2 weeks and then follow-up for 4 to 5-week appointment with Dr. Megan Salon. The patient started this this past Friday symptoms have not for a total of 3 days currently in full. 08/16/2019 upon evaluation today patient's wound actually does show muscle in the base of the wound but in general does appear to be much better as far as the overall evidence of infection is concerned. In fact I feel like this is for the most part cleared up he still on the IV antibiotics he has not completed the full course yet but I think he is doing much better which is excellent news. 08/23/2019 upon evaluation today patient appears to be doing about the same with regard to his wound at this point. He tells me that he still has pain unfortunately. Fortunately there is no evidence of systemic infection at this time which is great news. There is significant muscle protrusion. 09/13/19 upon evaluation today patient appears to be doing about the same in regard to his leg unfortunately. He still has a lot of  drainage coming from the ulceration there is still muscle exposed. With that being said the patient's last wound culture still showed an intermediate finding with regard to the Pseudomonas he still having the bluish/green drainage as well. Overall I do not know that the wound has completely cleared of infection at this point. Fortunately there is no signs of active infection systemically at this point which is good news. 09/20/2019 upon evaluation today patient's wound actually appears to be doing  about the same based on what I am seeing currently. I do not see any signs of systemic infection he still does have evidence of some local infection and drainage. He did see Dr. Megan Salon last week and Dr. Megan Salon states that he probably does need a different IV antibiotic although he does not want to put him on this until the patient begins the Remicade infusion which is actually scheduled for about 10 days out from today on 13 May. Following that time Dr. Megan Salon is good to see him back and then will evaluate the feasibility of starting him on the IV antibiotic therapy once again at that point. I do not disagree with this plan I do believe as Dr. Megan Salon stated in his note that I reviewed today that the patient's issue is multifactorial with the pyoderma being 1 aspect of this that were hoping the Remicade will be helpful for her. In the meantime I think the gentamicin is, helping to keep things under decent okay control in regard to the ulcer. 09/27/2019 upon evaluation today patient appears to be doing about the same with regard to his wound still there is a lot of muscle exposure though he does have some hyper granulation tissue noted around the edge and actually some granulation tissue starting to form over the muscle which is actually good news. Fortunately there is no evidence of active infection which is also good news. His pain is less at this point. 5/21; this is a patient I have not seen in a long time. 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 He has a large wound on the left lateral leg with protruding muscle. He comes in the clinic today showing the same area on his left medial ankle. He says there is been a spot there for some time although we have not previously defined this. Today he has a clearly defined area with slight amount of skin breakdown surrounded by raised areas with a purplish hue in color. This is not painful he says it is irritated.  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 He still has muscle protruding from the lateral portion of his left leg but fortunately the new area that they were concerned about at his last visit does not appear to have opened at this point. He is currently on Remicade infusions and seems to be doing better in my opinion in fact the wound itself seems to be overall much better. The purplish discoloration that he did have seems to have resolved and I think that is a good sign that hopefully the Remicade is doing its job. He does Mattes, Colston ([id number]) have some biofilm noted over the surface of the wound. 11/01/2019 on evaluation today patient's wound actually appears to be doing excellent at this time. Fortunately there is no evidence of active infection and overall I feel like he is making great progress. The Remicade seems to be due excellent  job in my opinion. 11/08/19 evaluation today vision actually appears to be doing quite well with regard to his weight ulcer. He's been tolerating dressing changes without complication. Fortunately there is no evidence of infection. No fevers, chills, nausea, or vomiting noted at this time. Overall states that is having more itching than pain which is actually a good sign in my opinion. 12/13/2019 upon evaluation today patient appears to be doing well today with regard to his wound. He has been tolerating the dressing changes without complication. Fortunately there is no sign of active infection at this time. No fevers, chills, nausea, vomiting, or diarrhea. Overall I feel like the infusion therapy has been very beneficial for him. 01/06/2020 on evaluation today patient appears to be doing well with regard to his wound. This is measuring smaller and actually looks to be doing better. Fortunately there is no signs of active infection at this point. No  fevers, chills, nausea, vomiting, or diarrhea. With that being said he does still have the blue-green drainage but this does not seem to be causing any significant issues currently. He has been using the gentamicin that does seem to be keeping things under decent control at this point. He goes later this morning for his next infusion therapy for the pyoderma which seems to also be very beneficial. 02/07/2020 on evaluation today patient appears to be doing about the same in regard to his wounds currently. Fortunately there is no signs of active infection systemically he does still have evidence of local infection still using gentamicin. He also is showing some signs of improvement albeit slowly I do feel like we are making some progress here. 02/21/2020 upon evaluation today patient appears to be making some signs of improvement the wound is measuring a little bit smaller which is great news and overall I am very pleased with where he stands currently. He is going to be having infusion therapy treatment on the 15th of this month. Fortunately there is no signs of active infection at this time. Objective Constitutional Well-nourished and well-hydrated in no acute distress. Vitals Time Taken: 8:20 AM, Height: 71 in, Weight: 338 lbs, BMI: 47.1, Temperature: 98.3 F, Pulse: 83 bpm, Respiratory Rate: 18 breaths/min, Blood Pressure: 148/80 mmHg. Respiratory normal breathing without difficulty. Psychiatric this patient is able to make decisions and demonstrates good insight into disease process. Alert and Oriented x 3. pleasant and cooperative. General Notes: Upon inspection patient's wound bed actually showed signs of good granulation at this time. There does not appear to be any evidence of active infection which is great news overall I am extremely pleased with where things stand. I think he is showing signs of excellent improvement overall. Integumentary (Hair, Skin) Wound #1 status is Open.  Original cause of wound was Gradually Appeared. The wound is located on the Left,Lateral Lower Leg. The wound measures 6cm length x 6cm width x 0.3cm depth; 28.274cm^2 area and 8.482cm^3 volume. There is muscle and Fat Layer (Subcutaneous Tissue) exposed. There is a large amount of purulent drainage noted. The wound margin is epibole. There is large (67-100%) pink granulation within the wound bed. There is a small (1-33%) amount of necrotic tissue within the wound bed including Adherent Slough. 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 Anesthetic (add to Medication List): Wound #1 Left,Lateral Lower Leg: Topical Lidocaine 4% cream applied to wound bed prior to debridement (In Clinic Only). Primary Wound Dressing: Gentamicin Sulfate Cream - mix 1:1 with triamcinolone to apply to the wound bed Secondary Dressing: Wound #1 Left,Lateral Lower Leg: Boardered Foam Dressing Dressing Change Frequency: Wound #1 Left,Lateral Lower Leg: Change dressing every day. Follow-up Appointments: Wound #1 Left,Lateral Lower Leg: Return Appointment in 3 weeks. Edema Control: Wound #1 Left,Lateral Lower Leg: Patient to wear own compression stockings Elevate legs to the level of the heart and pump ankles as often as possible 1. I would recommend currently that we actually continue with the wound care measures as before specifically with regard to the mixture of gentamicin along with steroid cream he seems to tolerate that very well and it does seem to be doing excellent form. 2. I am also can recommend that he actually continue with the bordered foam dressing to cover which he  tells me does seem to do the best for him to be honest unfortunately anything we wrap on tends to slide down and off. 3. I am also can recommend the patient elevate his legs is much as possible when he is home and not at work obviously I think this is important to try to keep the edema under good control as well. We will see patient back for reevaluation in 3 weeks here in the clinic. If anything worsens or changes patient will contact our office for additional recommendations. Electronic Signature(s) Signed: 02/21/2020 3:19:59 PM By: Worthy Keeler PA-C Entered By: Worthy Keeler on 02/21/2020 15:19:58 Carreto, [name] ([id number]) -------------------------------------------------------------------------------- SuperBill Details Patient Name: [name] Date of Service: 02/21/2020 Medical Record Number: [id number] Patient Account Number: [id number] Date of Birth/Sex: 1981-01-19 (39 y.o. M) Treating RN: Army Melia Primary Care Provider: [name] Other Clinician: Referring Provider: [name] Treating Provider/Extender: Melburn Hake, Detra Bores Weeks in Treatment: 167 Diagnosis Coding ICD-10 Codes Code Description [phone] Non-pressure chronic ulcer of left calf with fat layer exposed L88 Pyoderma gangrenosum L97.321 Non-pressure chronic ulcer of left ankle limited to breakdown of skin I87.2 Venous insufficiency (chronic) (peripheral) L03.116 Cellulitis of left lower limb Facility Procedures CPT4 Code: [id number] Description: 99213 - WOUND CARE VISIT-LEV 3 EST PT Modifier: Quantity: 1 Physician Procedures CPT4 Code: [id number] Description: 50277 - WC PHYS LEVEL 3 - EST PT Modifier: Quantity: 1 CPT4 Code: Description: ICD-10 Diagnosis Description L97.222 Non-pressure chronic ulcer of left calf with fat layer exposed L88 Pyoderma gangrenosum L97.321 Non-pressure chronic ulcer of left ankle limited to breakdown of skin I87.2 Venous insufficiency (chronic)   (peripheral) Modifier: Quantity: Electronic Signature(s) Signed: 02/21/2020 3:20:14 PM By: Worthy Keeler PA-C Entered By: Worthy Keeler on 02/21/2020 15:20:12

## 2020-02-29 ENCOUNTER — Other Ambulatory Visit: Payer: Self-pay | Admitting: Primary Care

## 2020-02-29 DIAGNOSIS — E119 Type 2 diabetes mellitus without complications: Secondary | ICD-10-CM

## 2020-03-06 NOTE — Progress Notes (Signed)
[name], [name] ([id number]) Visit Report for 02/07/2020 Arrival Information Details Patient Name: [name], [name] Date of Service: 02/07/2020 8:00 AM Medical Record Number: [id number] Patient Account Number: [id number] Date of Birth/Sex: January 22, 1981 (39 y.o. M) Treating RN: [name] Primary Care [name]: [name] Other Clinician: Referring [name]: [name] Treating [name]/Extender: [name], [name] in Treatment: 165 Visit Information History Since Last Visit All ordered tests and consults were completed: No Patient Arrived: Ambulatory Added or deleted any medications: No Arrival Time: 08:20 Any new allergies or adverse reactions: No Accompanied By: self Had a fall or experienced change in No Transfer Assistance: None activities of daily living that may affect Patient Identification Verified: Yes risk of falls: Secondary Verification Process Completed: Yes Signs or symptoms of abuse/neglect since last visito No Patient Requires Transmission-Based No Hospitalized since last visit: No Precautions: Implantable device outside of the clinic excluding No Patient Has Alerts: Yes cellular tissue based products placed in the center Patient Alerts: 08/09/19 Picc Line since last visit: Right Has Dressing in Place as Prescribed: Yes Pain Present Now: Yes Electronic Signature(s) Signed: 02/07/2020 12:07:35 PM By: [name] Entered By: [name] on 02/07/2020 08:21:20 [name] ([id number]) -------------------------------------------------------------------------------- Clinic Level of Care Assessment Details Patient Name: [name] Date of Service: 02/07/2020 8:00 AM Medical Record Number: [id number] Patient Account Number: [id number] Date of Birth/Sex: [birth year] (39 y.o. M) Treating RN: [name] Primary Care [name]: [name] Other Clinician: Referring [name]: [name] Treating [name]/Extender: [name], [name]  in Treatment: 165 Clinic Level of Care Assessment Items TOOL 4 Quantity Score []  - Use when only an EandM is performed on FOLLOW-UP visit 0 ASSESSMENTS - Nursing Assessment / Reassessment X - Reassessment of Co-morbidities (includes updates in patient status) 1 10 X- 1 5 Reassessment of Adherence to Treatment Plan ASSESSMENTS - Wound and Skin Assessment / Reassessment X - Simple Wound Assessment / Reassessment - one wound 1 5 []  - 0 Complex Wound Assessment / Reassessment - multiple wounds []  - 0 Dermatologic / Skin Assessment (not related to wound area) ASSESSMENTS - Focused Assessment []  - Circumferential Edema Measurements - multi extremities 0 []  - 0 Nutritional Assessment / Counseling / Intervention X- 1 5 Lower Extremity Assessment (monofilament, tuning fork, pulses) []  - 0 Peripheral Arterial Disease Assessment (using hand held doppler) ASSESSMENTS - Ostomy and/or Continence Assessment and Care []  - Incontinence Assessment and Management 0 []  - 0 Ostomy Care Assessment and Management (repouching, etc.) PROCESS - Coordination of Care X - Simple Patient / Family Education for ongoing care 1 15 []  - 0 Complex (extensive) Patient / Family Education for ongoing care []  - 0 Staff obtains Programmer, systems, Records, Test Results / Process Orders []  - 0 Staff telephones HHA, Nursing Homes / Clarify orders / etc []  - 0 Routine Transfer to another Facility (non-emergent condition) []  - 0 Routine Hospital Admission (non-emergent condition) []  - 0 New Admissions / Biomedical engineer / Ordering NPWT, Apligraf, etc. []  - 0 Emergency Hospital Admission (emergent condition) X- 1 10 Simple Discharge Coordination []  - 0 Complex (extensive) Discharge Coordination PROCESS - Special Needs []  - Pediatric / Minor Patient Management 0 []  - 0 Isolation Patient Management []  - 0 Hearing / Language / Visual special needs []  - 0 Assessment of Community assistance (transportation, D/C  planning, etc.) []  - 0 Additional assistance / Altered mentation []  - 0 Support Surface(s) Assessment (bed, cushion, seat, etc.) INTERVENTIONS - Wound Cleansing / Measurement [name], [name] ([id number]) X- 1 5 Simple Wound Cleansing -  one wound []  - 0 Complex Wound Cleansing - multiple wounds X- 1 5 Wound Imaging (photographs - any number of wounds) []  - 0 Wound Tracing (instead of photographs) X- 1 5 Simple Wound Measurement - one wound []  - 0 Complex Wound Measurement - multiple wounds INTERVENTIONS - Wound Dressings X - Small Wound Dressing one or multiple wounds 1 10 []  - 0 Medium Wound Dressing one or multiple wounds []  - 0 Large Wound Dressing one or multiple wounds []  - 0 Application of Medications - topical []  - 0 Application of Medications - injection INTERVENTIONS - Miscellaneous []  - External ear exam 0 []  - 0 Specimen Collection (cultures, biopsies, blood, body fluids, etc.) []  - 0 Specimen(s) / Culture(s) sent or taken to Lab for analysis []  - 0 Patient Transfer (multiple staff / Civil Service fast streamer / Similar devices) []  - 0 Simple Staple / Suture removal (25 or less) []  - 0 Complex Staple / Suture removal (26 or more) []  - 0 Hypo / Hyperglycemic Management (close monitor of Blood Glucose) []  - 0 Ankle / Brachial Index (ABI) - do not check if billed separately X- 1 5 Vital Signs Has the patient been seen at the hospital within the last three years: Yes Total Score: 80 Level Of Care: New/Established - Level 3 Electronic Signature(s) Signed: 02/07/2020 4:33:41 PM By: [name] Entered By: [name] on 02/07/2020 08:51:22 [name] ([id number]) -------------------------------------------------------------------------------- Encounter Discharge Information Details Patient Name: [name] Date of Service: 02/07/2020 8:00 AM Medical Record Number: [id number] Patient Account Number: [id number] Date of Birth/Sex: 01-May-1981 (39 y.o. M) Treating  RN: [name] Primary Care [name]: [name] Other Clinician: Referring [name]: [name] Treating [name]/Extender: [name], [name] in Treatment: 165 Encounter Discharge Information Items Discharge Condition: Stable Ambulatory Status: Ambulatory Discharge Destination: Home Transportation: Private Auto Accompanied By: self Schedule Follow-up Appointment: Yes Clinical Summary of Care: Electronic Signature(s) Signed: 02/07/2020 4:33:41 PM By: [name] Entered By: [name] on 02/07/2020 08:52:31 [name] ([id number]) -------------------------------------------------------------------------------- Lower Extremity Assessment Details Patient Name: [name] Date of Service: 02/07/2020 8:00 AM Medical Record Number: [id number] Patient Account Number: [id number] Date of Birth/Sex: Mar 13, 1981 (39 y.o. M) Treating RN: [name] Primary Care Laiza Veenstra: [name] Other Clinician: Referring Victorian Gunn: [name] Treating Jalah Warmuth/Extender: [name], [name] in Treatment: 165 Edema Assessment Assessed: [Left: Yes] [Right: No] Edema: [Left: Ye] [Right: s] Calf Left: Right: Point of Measurement: 33 cm From Medial Instep 44 cm cm Ankle Left: Right: Point of Measurement: 12 cm From Medial Instep 22 cm cm Vascular Assessment Pulses: Dorsalis Pedis Palpable: [Left:Yes] Posterior Tibial Palpable: [Left:Yes] Electronic Signature(s) Signed: 02/07/2020 12:07:35 PM By: [name] Signed: 03/06/2020 10:28:27 AM By: [name], [name], RN, [name], [name], [name] Entered By: [name] on 02/07/2020 08:24:50 [name] ([id number]) -------------------------------------------------------------------------------- Multi Wound Chart Details Patient Name: [name] Date of Service: 02/07/2020 8:00 AM Medical Record Number: [id number] Patient Account Number: [id number] Date of Birth/Sex: 1980/09/13 (39 y.o. M) Treating RN: [name] Primary Care Darnel Mchan: [name] Other Clinician: Referring Apolinar Bero: [name] Treating [place] Leap/Extender: [name], [name] in Treatment: 165 Vital Signs Height(in): 71 Pulse(bpm): 80 Weight(lbs): 338 Blood Pressure(mmHg): 155/80 Body Mass Index(BMI): 47 Temperature(F): 98.5 Respiratory Rate(breaths/min): 20 Photos: [N/A:N/A] Wound Location: Left, Lateral Lower Leg N/A N/A Wounding Event: Gradually Appeared N/A N/A Primary Etiology: Pyoderma N/A N/A Comorbid History: Sleep Apnea, Hypertension, Colitis N/A N/A Date Acquired: 11/18/2015 N/A N/A [name] of Treatment: 165 N/A N/A Wound Status: Open N/A N/A Measurements  L x W x D (cm) 6x6.3x0.3 N/A N/A Area (cm) : 29.688 N/A N/A Volume (cm) : 8.906 N/A N/A % Reduction in Area: -504.80% N/A N/A % Reduction in Volume: -126.80% N/A N/A Classification: Full Thickness With Exposed N/A N/A Support Structures Exudate Amount: Large N/A N/A Exudate Type: Purulent N/A N/A Exudate Color: yellow, brown, green N/A N/A Wound Margin: Epibole N/A N/A Granulation Amount: Large (67-100%) N/A N/A Granulation Quality: Pink N/A N/A Necrotic Amount: Small (1-33%) N/A N/A Exposed Structures: Fat Layer (Subcutaneous Tissue): N/A N/A Yes Muscle: Yes Fascia: No Tendon: No Joint: No Bone: No Epithelialization: None N/A N/A Treatment Notes Electronic Signature(s) Signed: 02/07/2020 4:33:41 PM By: [name] Entered By: [name] on 02/07/2020 08:44:05 [name] ([id number]) -------------------------------------------------------------------------------- Multi-Disciplinary Care Plan Details Patient Name: [name] Date of Service: 02/07/2020 8:00 AM Medical Record Number: [id number] Patient Account Number: [id number] Date of Birth/Sex: 04-25-1981 (39 y.o. M) Treating RN: [name] Primary Care Mayson Mcneish: [name] Other Clinician: Referring Rindy Kollman: [name] Treating  Nicosha Struve/Extender: [name], [name] in Treatment: 165 Active Inactive Venous Leg Ulcer Nursing Diagnoses: Knowledge deficit related to disease process and management Potential for venous Insuffiency (use before diagnosis confirmed) Goals: Non-invasive venous studies are completed as ordered Date Initiated: 12/11/2016 Target Resolution Date: 03/13/2017 Goal Status: Active Patient will maintain optimal edema control Date Initiated: 12/11/2016 Target Resolution Date: 03/13/2017 Goal Status: Active Patient/caregiver will verbalize understanding of disease process and disease management Date Initiated: 12/11/2016 Target Resolution Date: 03/13/2017 Goal Status: Active Verify adequate tissue perfusion prior to therapeutic compression application Date Initiated: 12/11/2016 Target Resolution Date: 03/13/2017 Goal Status: Active Interventions: Assess peripheral edema status every visit. Compression as ordered Provide education on venous insufficiency Treatment Activities: Therapeutic compression applied : 12/11/2016 Notes: Wound/Skin Impairment Nursing Diagnoses: Impaired tissue integrity Knowledge deficit related to ulceration/compromised skin integrity Goals: Patient/caregiver will verbalize understanding of skin care regimen Date Initiated: 12/11/2016 Target Resolution Date: 03/13/2017 Goal Status: Active Ulcer/skin breakdown will have a volume reduction of 30% by week 4 Date Initiated: 12/11/2016 Target Resolution Date: 03/13/2017 Goal Status: Active Ulcer/skin breakdown will have a volume reduction of 50% by week 8 Date Initiated: 12/11/2016 Target Resolution Date: 03/13/2017 Goal Status: Active Ulcer/skin breakdown will have a volume reduction of 80% by week 12 Date Initiated: 12/11/2016 Target Resolution Date: 03/13/2017 Goal Status: Active Ulcer/skin breakdown will heal within 14 [name] Date Initiated: 12/11/2016 Target Resolution Date: 03/13/2017 Goal Status:  Active [name], [name] ([id number]) Interventions: Assess patient/caregiver ability to obtain necessary supplies Assess patient/caregiver ability to perform ulcer/skin care regimen upon admission and as needed Assess ulceration(s) every visit Provide education on ulcer and skin care Treatment Activities: Skin care regimen initiated : 12/11/2016 Topical wound management initiated : 12/11/2016 Notes: Electronic Signature(s) Signed: 02/07/2020 4:33:41 PM By: [name] Entered By: [name] on 02/07/2020 08:43:44 [name], [name] ([id number]) -------------------------------------------------------------------------------- Pain Assessment Details Patient Name: [name] Date of Service: 02/07/2020 8:00 AM Medical Record Number: [id number] Patient Account Number: [id number] Date of Birth/Sex: 04/29/1981 (39 y.o. M) Treating RN: [name] Primary Care Deanthony Maull: [name] Other Clinician: Referring Ravi Tuccillo: [name] Treating Dawayne Ohair/Extender: [name], [name] in Treatment: 165 Active Problems Location of Pain Severity and Description of Pain Patient Has Paino Yes Site Locations Pain Location: Pain in Ulcers With Dressing Change: No Rate the pain. Current Pain Level: 2 Worst Pain Level: 3 Least Pain Level: 1 Tolerable Pain Level: 3 Character of Pain Describe the Pain: Aching Pain Management and Medication Current Pain Management: Electronic Signature(s)  Signed: 02/07/2020 12:07:35 PM By: [name] Signed: 03/06/2020 10:28:27 AM By: [name], [name], RN, [name], [name], [name] Entered By: [name] on 02/07/2020 08:22:11 [name], [name] ([id number]) -------------------------------------------------------------------------------- Patient/Caregiver Education Details Patient Name: [name] Date of Service: 02/07/2020 8:00 AM Medical Record Number: [id number] Patient Account Number: [id number] Date of Birth/Gender: 1981-01-12 (39 y.o. M) Treating RN:  [name] Primary Care Physician: [name] Other Clinician: Referring Physician: Alma [name] Treating Physician/Extender: [name], [name] in Treatment: 165 Education Assessment Education Provided To: Patient Education Topics Provided Wound/Skin Impairment: Handouts: Caring for Your Ulcer Methods: Explain/Verbal Responses: State content correctly Electronic Signature(s) Signed: 02/07/2020 4:33:41 PM By: [name] Entered By: [name] on 02/07/2020 08:44:21 [name] ([id number]) -------------------------------------------------------------------------------- Wound Assessment Details Patient Name: [name] Date of Service: 02/07/2020 8:00 AM Medical Record Number: [id number] Patient Account Number: [id number] Date of Birth/Sex: 04-Jan-1981 (39 y.o. M) Treating RN: [name] Primary Care Barnabas Henriques: [name] Other Clinician: Referring Khalilah Hoke: [name] Treating [name]/Extender: [name], [name] in Treatment: 165 Wound Status Wound Number: 1 Primary Etiology: Pyoderma Wound Location: Left, Lateral Lower Leg Wound Status: Open Wounding Event: Gradually Appeared Comorbid History: Sleep Apnea, Hypertension, Colitis Date Acquired: 11/18/2015 [name] Of Treatment: 165 Clustered Wound: No Photos Wound Measurements Length: (cm) 6 Width: (cm) 6.3 Depth: (cm) 0.3 Area: (cm) 29.688 Volume: (cm) 8.906 % Reduction in Area: -504.8% % Reduction in Volume: -126.8% Epithelialization: None Wound Description Classification: Full Thickness With Exposed Support Structures Wound Margin: Epibole Exudate Amount: Large Exudate Type: Purulent Exudate Color: yellow, brown, green Foul Odor After Cleansing: No Slough/Fibrino Yes Wound Bed Granulation Amount: Large (67-100%) Exposed Structure Granulation Quality: Pink Fascia Exposed: No Necrotic Amount: Small (1-33%) Fat Layer (Subcutaneous Tissue) Exposed: Yes Necrotic  Quality: Adherent Slough Tendon Exposed: No Muscle Exposed: Yes Necrosis of Muscle: No Joint Exposed: No Bone Exposed: No Electronic Signature(s) Signed: 02/07/2020 12:07:35 PM By: [name] Signed: 03/06/2020 10:28:27 AM By: [name], [name], RN, [name], [name], [name] Entered By: [name] on 02/07/2020 08:24:06 [name] ([id number]) -------------------------------------------------------------------------------- [place] Details Patient Name: [name] Date of Service: 02/07/2020 8:00 AM Medical Record Number: [id number] Patient Account Number: [id number] Date of Birth/Sex: 1981/01/19 (39 y.o. M) Treating RN: [name] Primary Care Levar Fayson: [name] Other Clinician: Referring Whitley Strycharz: [name] Treating Nautia Lem/Extender: [name], [name] in Treatment: 165 Vital Signs Time Taken: 08:05 Temperature (F): 98.5 Height (in): 71 Pulse (bpm): 80 Weight (lbs): 338 Respiratory Rate (breaths/min): 20 Body Mass Index (BMI): 47.1 Blood Pressure (mmHg): 155/80 Reference Range: 80 - 120 mg / dl Electronic Signature(s) Signed: 02/07/2020 12:07:35 PM By: [name] Entered By: [name] on 02/07/2020 08:21:43

## 2020-03-13 ENCOUNTER — Other Ambulatory Visit: Payer: Self-pay

## 2020-03-13 ENCOUNTER — Encounter: Payer: 59 | Admitting: Physician Assistant

## 2020-03-13 DIAGNOSIS — L97222 Non-pressure chronic ulcer of left calf with fat layer exposed: Secondary | ICD-10-CM | POA: Diagnosis not present

## 2020-03-14 NOTE — Progress Notes (Signed)
[name], [name] ([id number]) Visit Report for 03/13/2020 Chief Complaint Document Details Patient Name: [name], [name] Date of Service: 03/13/2020 9:00 AM Medical Record Number: [id number] Patient Account Number: [id number] Date of Birth/Sex: 09-23-1980 (39 y.o. 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 M) Treating RN: [name] Primary Care Provider: [name] Other Clinician: Referring Provider: [name] Treating Provider/Extender: [name] Cliche in Treatment: 170 History of Present Illness HPI Description: 12/04/16; 39 year old man who comes into the clinic today for review of a wound on the posterior left calf. He tells me that is been there for about a year. He is not a diabetic he does smoke half a pack per day. He was seen in the ER on 11/20/16 felt to have cellulitis around the wound and was given clindamycin. An x-ray did not show osteomyelitis. The patient initially tells me that he has a milk allergy that sets off a pruritic itching rash on his lower legs which she scratches incessantly and he thinks that's what may have set up the wound. He has been using various topical antibiotics and ointments without any effect. He works in a [employer] and is on his feet all day. He does not have a prior history  of wounds however he does have the rash on both lower legs the right arm and the ventral aspect of his left arm. These are excoriations and clearly have had scratching however there are of macular looking areas on both legs including a substantial larger area on the right leg. This does not have an underlying open area. There is no blistering. The patient tells me that 2 years ago in [place] in response to the rash on his legs he saw a dermatologist who told him he had a condition which may be pyoderma gangrenosum although I may be putting words into his mouth. He seemed to recognize this. 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 He is not in any treatment for this. He's had no recent travel 12/11/16; the patient arrives today with his wound and roughly the same condition we've been using silver alginate this is a deep punched out wound with some surrounding erythema but no tenderness. Biopsy I did did not show confirmed pyoderma gangrenosum suggested nonspecific inflammation and vasculitis but does not provide an actual description of what was seen by the pathologist. I'm really not able to understand this We have also received information from the patient's dermatologist in [place] notes from April 2016. This was a doctor Agarwal-antal. 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 He was prescribed topical steroid high potency under occlusion which helped but at this point the patient did not have a deep punched out wound. 12/18/16; the patient's wound is larger in terms of surface area however this surface looks better and there is less depth. The surrounding erythema also is better. The patient states that the wrap we put on came off 2 days ago when he has been using his compression stockings.  He we are in the process of getting a dermatology consult. 12/26/16 on evaluation today patient's left lower extremity wound shows evidence of infection with  surrounding erythema noted. He has been tolerating the dressing changes but states that he has noted more discomfort. There is a larger area of erythema surrounding the wound. No fevers, chills, nausea, or vomiting noted at this time. With that being said the wound still does have slough covering the surface. He is not allergic to any medication that he is aware of at this point. In regard to his right lower extremity he had several regions that are erythematous and pruritic he wonders if there's anything we can do to help that. 01/02/17 I reviewed patient's wound culture which was obtained his visit last week. He was placed on doxycycline at that point. Unfortunately that does not appear to be an antibiotic that would likely help with the situation however the pseudomonas noted on culture is sensitive to Cipro. Also unfortunately patient's wound seems to have a large compared to last week's evaluation. Not severely so but there are definitely increased measurements in general. He is continuing to have discomfort as well he writes this to be a seven out of 10. In fact he would prefer me not to perform any debridement today due to the fact that he is having discomfort and considering he has an active infection on the little reluctant to do so anyway. No fevers, chills, nausea, or vomiting noted at this time. 01/08/17; patient seems dermatology on September 5. I suspect dermatology will want the slides from the biopsy I did sent to their pathologist. I'm not sure if there is a way we can expedite that. In any case the culture I did before I left on vacation 3 weeks ago showed Pseudomonas he was given 10 days of Cipro and per her description of her intake nurses is actually somewhat better this week although the wound is quite a bit bigger than I remember the last time I saw this. He still has 3 more days of Cipro 01/21/17; dermatology appointment tomorrow. He has completed the ciprofloxacin for Pseudomonas.  Surface of the wound looks better however he is had some deterioration in the lesions on his right leg. Meantime the left lateral leg wound we will continue with sample 01/29/17; patient had his dermatology appointment but I can't yet see that note. He is completed his antibiotics. The wound is more superficial but considerably larger in circumferential area than when he came in. This is in his left lateral calf. He also has swollen erythematous areas with superficial wounds on the right leg and small papular areas on both arms. There apparently areas in her his upper thighs and buttocks I did not look at those. Dermatology biopsied the right leg. Hopefully will have their input next week. 02/05/17; patient went back to see his dermatologist who told him that he had a "scratching problem" as well as staph. He is now on a 30 day course of doxycycline and I believe she gave him triamcinolone cream to the right leg areas to help with the itching [not exactly sure but probably triamcinolone]. She apparently looked at the left lateral leg wound although this was not rebiopsied and I think felt to be ultimately part of the same pathogenesis. He is using sample border foam and changing nevus himself. He now has a new open area on the right posterior leg which was his biopsy site I don't have any of the dermatology notes  02/12/17; we put the patient in compression last week with SANTYL to the wound on the left leg and the biopsy. Edema is much better and the depth of the wound is now at level of skin. Area is still the same oBiopsy site on the right lateral leg we've also been using santyl with a border foam dressing and he is changing this himself. 02/19/17; Using silver alginate started last week to both the substantial left leg wound and the biopsy site on the right wound. He is tolerating compression well. Has a an appointment with his primary M.D. tomorrow wondering about diuretics although I'm wondering if  the edema problem is actually lymphedema 02/26/17; the patient has been to see his primary doctor Dr. Jerrel Ivory at [facility]. She started him on Lasix 20 mg and this seems to have helped with the edema. However we are not making substantial change with the left lateral calf wound and inflammation. The biopsy site on the right leg also looks stable but not really all that different. 03/12/17; the patient has been to see vein and vascular Dr. Lucky Cowboy. He has had venous reflux studies I have not reviewed these. I did get a call from his dermatology office. They felt that he might have pathergy based on their biopsy on his right leg which led them to look at the slides of ADIEN, KIMMEL ([id number]) the biopsy I did on the left leg and they wonder whether this represents pyoderma gangrenosum which was the original supposition in a man with ulcerative colitis albeit inactive for many years. They therefore recommended clobetasol and tetracycline i.e. 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 03/26/17; apparently the patient just had reflux studies not an appointment with Dr. dew. She arrives in clinic today having applied clobetasol for 2-3 weeks. He notes over the last 2-3 days excessive drainage having to change the dressing 3-4 times a day and also expanding erythema. 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 Lillia Carmel at [facility] dermatology locally who kindly saw him at my request. 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 My concern was the degree of erythema medially extending from his left leg wound which was either inflammation from pyoderma or cellulitis. I  put him on Augmentin however culture of the wound showed Pseudomonas which is quinolone sensitive. I really don't believe he has cellulitis however in view of everything I will continue and give him a course of Cipro. He is also on doxycycline as an immune modulator for the pyoderma. In addition to his original wound on the left lateral leg with surrounding erythema he has a wound on the right posterior calf which was an original biopsy site done by dermatology. 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 Saw Dr. Lillia Carmel yesterday. He has been using topical antibiotics to both wound areas his original wound on the left and the biopsies/pathergy area on the right. There is definitely some improvement in the inflammation around the wound on the right although the patient states he has increasing sensitivity of the wounds. He is on prednisone 60 and doxycycline 1 as prescribed by Dr. Lillia Carmel. He is covering the topical antibiotic with gauze  and putting this in his own compression stocks and changing this daily. He states that Dr. Lottie Rater did a culture of the left leg wound yesterday 05/07/17; pyoderma gangrenosum. The patient saw Dr. Lillia Carmel yesterday and has a follow-up with her in one month. He is still using topical antibiotics to both wounds although he can't recall exactly what type. He is still on prednisone 60 mg. Dr. Lillia Carmel stated that the doxycycline could stop if we were in agreement. 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 The right medial leg was induced by biopsy/pathergy. The area on the right is essentially healed. 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 The right medial leg has closed and remains closed. He is still on  prednisone 60. oHe tells me he missed his last dermatology appointment with Dr. Lillia Carmel but will make another appointment. He reports that her blood sugar at a recent screen in [place] was high 200's. He was 180 today. 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 I think he is going to require still much longer prednisone perhaps another 3 months before attempting to taper. In the meantime his wound is a lot better. Smaller. He is cleaning this off daily and applying topical antibiotics. When he was last in the clinic I thought about changing to [facility] and actually put in a couple of calls to dermatology although probably not during their business hours. 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 He continues on oral prednisone. He has been using triple antibiotic ointment. There is surface debris and we will transition to [facility] and have him return in 2 weeks. He has lost 30 pounds since his last appointment with lifestyle modification. He may benefit from topical steroid cream for treatment this can be considered at a later date. 08/22/17 on evaluation today patient appears to actually be doing rather well in regard to his left lateral lower extremity ulcer. He has actually been managed by Dr. Dellia Nims most recently. Patient is currently on oral steroids at this time. This seems to have been of benefit for him. Nonetheless his last visit was actually with Leah on 08/06/17. Currently he is not utilizing any topical steroid creams although this could be of benefit as well. No fevers, chills, nausea, or vomiting noted at this time. 09/05/17 on evaluation today patient appears to be doing better in regard to his left lateral lower extremity ulcer. He has been tolerating the dressing changes without complication. He is using Santyl with good effect. Overall I'm very  pleased with how things are standing at this point. Patient likewise is happy that this is doing better. 09/19/17 on evaluation today patient actually appears to be doing rather well in regard to his left lateral lower extremity ulcer. 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 He's not having any significant pain. 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 He has been tolerating the Santyl without complication and in general I feel like he's making good progress. 10/17/17 on evaluation today patient  appears to be doing very well in regard to his left lateral lower surety ulcer. He has been tolerating the dressing changes without complication. There does not appear to be any evidence of infection he's alternating the Santyl and the triple antibiotic ointment every other day this seems to be doing well for him. 11/03/17 on evaluation today patient appears to be doing very well in regard to his left lateral lower extremity ulcer. He is been tolerating the dressing changes without complication which is good news. Fortunately there does not appear to be any evidence of infection which is also great news. Overall is doing excellent they are starting to taper down on the prednisone is down to 40 mg at this point it also started topical clobetasol for him. 11/17/17 on evaluation today patient appears to be doing well in regard to his left lateral lower surety ulcer. He's been tolerating the dressing changes without complication. He does note that he is having no pain, no excessive drainage or discharge, and overall he feels like things are going about how he would expect and hope they would. Overall he seems to have no evidence of infection at this time in my opinion which is good news. 12/04/17-He is seen in follow-up evaluation  for right lateral lower extremity ulcer. He has been applying topical steroid cream. Today's measurement show slight increase in size. Over the next 2 weeks we will transition to every other day Santyl and steroid cream. He has been encouraged to monitor for changes and notify clinic with any concerns 12/15/17 on evaluation today patient's left lateral motion the ulcer and fortunately is doing worse again at this point. This just since last week to this week has close to doubled in size according to the patient. I did not seeing last week's I do not have a visual to compare this to in our system was also down so we do not have all the charts and at this point. Nonetheless it does have me somewhat concerned in regard to the fact that again he was worried enough about it he has contact the dermatology that placed them back on the full strength, 50 mg a day of the prednisone that he was taken previous. He continues to alternate using clobetasol along with Santyl at this point. He is obviously somewhat frustrated. 12/22/17 on evaluation today patient appears to be doing a little worse compared to last evaluation. Unfortunately the wound is a little deeper and slightly larger than the last week's evaluation. With that being said he has made some progress in regard to the irritation surrounding at this time unfortunately despite that progress that's been made he still has a significant issue going on here. 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 MARGARET, STAGGS ([id number]) For that reason I discussed with him today the possibility of perform a wound culture to ensure there's nothing overtly infected. 01/06/18 on evaluation today patient's wound is larger and deeper than previously evaluated. With that being said it did appear that his wound was infected after  my last evaluation with him. Subsequently I did end up prescribing a prescription for Bactrim DS which she has been taking and having no complication with. Fortunately there does not appear to be any evidence of infection at this point in time as far as anything spreading, no want to touch, and overall I feel like  things are showing signs of improvement. 01/13/18 on evaluation today patient appears to be even a little larger and deeper than last time. There still muscle exposed in the base of the wound. Nonetheless he does appear to be less erythematous I do believe inflammation is calming down also believe the infection looks like it's probably resolved at this time based on what I'm seeing. No fevers, chills, nausea, or vomiting noted at this time. 01/30/18 on evaluation today patient actually appears to visually look better for the most part. Unfortunately those visually this looks better he does seem to potentially have what may be an abscess in the muscle that has been noted in the central portion of the wound. This is the first time that I have noted what appears to be fluctuance in the central portion of the muscle. With that being said I'm somewhat more concerned about the fact that this might indicate an abscess formation at this location. I do believe that an ultrasound would be appropriate. This is likely something we need to try to do as soon as possible. He has been switch to mupirocin ointment and he is no longer using the steroid ointment as prescribed by dermatology he sees them again next week he's been decreased from 60 to 40 mg of prednisone. 03/09/18 on evaluation today patient actually appears to be doing a little better compared to last time I saw him. There's not as much erythema surrounding the wound itself. He I did review his most recent infectious disease note which was dated 02/24/18. He saw Dr. Michel Bickers in [place]. 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 Patient is now off of antibiotics and they are continually observing this. There seems to be no change in the past two weeks in my pinion based on what the patient says and what I see today compared to what Dr. Megan Salon likely saw two weeks ago. No fevers, chills, nausea, or vomiting noted at this time. 03/23/18 on evaluation today patient's wound actually appears to be showing signs of improvement which is good news. He is currently still on the Dapsone. He is also working on tapering the prednisone to get off of this and Dr. Lottie Rater is working with him in this regard. Nonetheless overall I feel like the wound is doing well it does appear based on the infectious disease note that I reviewed from Dr. 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 I did also review the results of his ultrasound of left lower extremity which revealed there was a dentist tissue in the base of the wound without an abscess noted. 04/06/18 on evaluation today the patient's left lateral lower extremity ulcer actually appears to be doing fairly well which is excellent news. There does not appear to be any evidence of infection at this time which is also great news. Overall he still does have a significantly large ulceration although little by little he seems to be making progress. He is down to 10 mg a day of the prednisone. 04/20/18 on evaluation today patient actually appears to be doing excellent at this time in regard to his left lower extremity ulcer. He's making signs of good progress unfortunately this is taking much longer than we would really like to see but nonetheless he is  making progress. Fortunately there does not appear to be any evidence of infection at this time. No fevers, chills, nausea, or vomiting noted at this time.  The patient has not been using the Santyl due to the cost he hadn't got in this field yet. He's mainly been using the antibiotic ointment topically. Subsequently he also tells me that he really has not been scrubbing in the shower I think this would be helpful again as I told him it doesn't have to be anything too aggressive to even make it believe just enough to keep it free of some of the loose slough and biofilm on the wound surface. 05/11/18 on evaluation today patient's wound appears to be making slow but sure progress in regard to the left lateral lower extremity ulcer. He is been tolerating the dressing changes without complication. Fortunately there does not appear to be any evidence of infection at this time. He is still just using triple antibiotic ointment along with clobetasol occasionally over the area. He never got the Santyl and really does not seem to intend to in my pinion. 06/01/18 on evaluation today patient appears to be doing a little better in regard to his left lateral lower extremity ulcer. He states that overall he does not feel like he is doing as well with the Dapsone as he did with the prednisone. Nonetheless he sees his dermatologist later today and is gonna talk to them about the possibility of going back on the prednisone. Overall again I believe that the wound would be better if you would utilize Santyl but he really does not seem to be interested in going back to the [place] at this point. He has been using triple antibiotic ointment. 06/15/18 on evaluation today patient's wound actually appears to be doing about the same at this point. Fortunately there is no signs of infection at this time. He has made slight improvements although he continues to not really want to clean the wound bed at this point. He states that he just doesn't mess with it he doesn't want to cause any problems with everything else he has going on. He has been on medication, antibiotics as prescribed  by his dermatologist, for a staff infection of his lower extremities which is really drying out now and looking much better he tells me. Fortunately there is no sign of overall infection. 06/29/18 on evaluation today patient appears to be doing well in regard to his left lateral lower surety ulcer all things considering. Fortunately his staff infection seems to be greatly improved compared to previous. He has no signs of infection and this is drying up quite nicely. He is still the doxycycline for this is no longer on cental, Dapsone, or any of the other medications. His dermatologist has recommended possibility of an infusion but right now he does not want to proceed with that. 07/13/18 on evaluation today patient appears to be doing about the same in regard to his left lateral lower surety ulcer. Fortunately there's no signs of infection at this time which is great news. Unfortunately he still builds up a significant amount of Slough/biofilm of the surface of the wound he still is not really cleaning this as he should be appropriately. Again I'm able to easily with saline and gauze remove the majority of this on the surface which if you would do this at home would likely be a dramatic improvement for him as far as getting the area to improve. Nonetheless overall I still feel like he  is making progress is just very slow. I think Santyl will be of benefit for him as well. Still he has not gotten this as of this point. 07/27/18 on evaluation today patient actually appears to be doing little worse in regards of the erythema around the periwound region of the wound he also tells me that he's been having more drainage currently compared to what he was experiencing last time I saw him. He states not quite as bad as what he had because this was infected previously but nonetheless is still appears to be doing poorly. Fortunately there is no evidence of systemic infection at this point. The patient tells me that  he is not going to be able to afford the Santyl. He is still waiting to hear about the infusion therapy with his dermatologist. Apparently she wants an updated colonoscopy first. 08/10/18 on evaluation today patient appears to be doing better in regard to his left lateral lower extremity ulcer. Fortunately he is showing signs of improvement in this regard he's actually been approved for Remicade infusion's as well although this has not been scheduled as of yet. Fortunately there's no signs of active infection at this time in regard to the wound although he is having some issues with infection of the right lower extremity is been seen as dermatologist for this. Fortunately they are definitely still working with him trying to keep things under control. [name], [name] ([id number]) 09/07/18 on evaluation today patient is actually doing rather well in regard to his left lateral lower extremity ulcer. He notes these actually having some hair grow back on his extremity which is something he has not seen in years. He also tells me that the pain is really not giving them any trouble at this time which is also good news overall she is very pleased with the progress he's using a combination of the mupirocin along with the probate is all mixed. 09/21/18 on evaluation today patient actually appears to be doing fairly well all things considered in regard to his looks from the ulcer. He's been tolerating the dressing changes without complication. Fortunately there's no signs of active infection at this time which is good news he is still on all antibiotics or prevention of the staff infection. He has been on prednisone for time although he states it is gonna contact his dermatologist and see if she put them on a short course due to some irritation that he has going on currently. Fortunately there's no evidence of any overall worsening this is going very slow I think cental would be something that would be helpful for him  although he states that $50 for tube is quite expensive. He therefore is not willing to get that at this point. 10/06/18 on evaluation today patient actually appears to be doing decently well in regard to his left lateral leg ulcer. He's been tolerating the dressing changes without complication. Fortunately there's no signs of active infection at this time. Overall I'm actually rather pleased with the progress he's making although it's slow he doesn't show any signs of infection and he does seem to be making some improvement. I do believe that he may need a switch up and dressings to try to help this to heal more appropriately and quickly. 10/19/18 on evaluation today patient actually appears to be doing better in regard to his left lateral lower extremity ulcer. This is shown signs of having much less Slough buildup at this point due to the fact he has been using  the Santyl. Obviously this is very good news. The overall size of the wound is not dramatically smaller but again the appearance is. 11/02/18 on evaluation today patient actually appears to be doing quite well in regard to his lower [place] ulcer. A lot of the skin around the ulcer is actually somewhat irritating at this point this seems to be more due to the dressing causing irritation from the adhesive that anything else. Fortunately there is no signs of active infection at this time. 11/24/18 on evaluation today patient appears to be doing a little worse in regard to his overall appearance of his lower extremity ulcer. There's more erythema and warmth around the wound unfortunately. He is currently on doxycycline which he has been on for some time. With that being said I'm not sure that seems to be helping with what appears to possibly be an acute cellulitis with regard to his left lower extremity ulcer. No fevers, chills, nausea, or vomiting noted at this time. 12/08/18 on evaluation today patient's wounds actually appears to be doing  significantly better compared to his last evaluation. He has been using Santyl along with alternating tripling about appointment as well as the steroid cream seems to be doing quite well and the wound is showing signs of improvement which is excellent news. Fortunately there's no evidence of infection and in fact his culture came back negative with only normal skin flora noted. 12/21/2018 upon evaluation today patient actually appears to be doing excellent with regard to his ulcer. This is actually the best that I have seen it since have been helping to take care of him. It is both smaller as well as less slough noted on the surface of the wound and seems to be showing signs of good improvement with new skin growing from the edges. He has been using just the triamcinolone he does wonder if he can get a refill of that ointment today. 01/04/2019 upon evaluation today patient actually appears to be doing well with regard to his left lateral lower extremity ulcer. With that being said it does not appear to be that he is doing quite as well as last time as far as progression is concerned. There does not appear to be any signs of infection or significant irritation which is good news. With that being said I do believe that he may benefit from switching to a collagen based dressing based on how clean The wound appears. 01/18/2019 on evaluation today patient actually appears to be doing well with regard to his wound on the left lower extremity. He is not made a lot of progress compared to where we were previous but nonetheless does seem to be doing okay at this time which is good news. There is no signs of active infection which is also good news. My only concern currently is I do wish we can get him into utilizing the collagen dressing his insurance would not pay for the supplies that we ordered although it appears that he may be able to order this through his supply company that he typically utilizes. This is  [employer]. Nonetheless he did try to order it during the office visit today and it appears this did go through. We will see if he can get that it is a different brand but nonetheless he has collagen and I do think will be beneficial. 02/01/2019 on evaluation today patient actually appears to be doing a little worse today in regard to the overall size of his wounds. Fortunately there  is no signs of active infection at this time. That is visually. Nonetheless when this is happened before it was due to infection. For that reason were somewhat concerned about that this time as well. 02/08/2019 on evaluation today patient unfortunately appears to be doing slightly worse with regard to his wound upon evaluation today. Is measuring a little deeper and a little larger unfortunately. I am not really sure exactly what is causing this to enlarge he actually did see his dermatologist she is going to see about initiating Humira for him. Subsequently she also did do steroid injections into the wound itself in the periphery. Nonetheless still nonetheless he seems to be getting a little bit larger he is gone back to just using the steroid cream topically which I think is appropriate. I would say hold off on the collagen for the time being is definitely a good thing to do. Based on the culture results which we finally did get the final result back regarding it shows staph as the bacteria noted again that can be a normal skin bacteria based on the fact however he is having increased drainage and worsening of the wound measurement wise I would go ahead and place him on an antibiotic today I do believe for this. 02/15/2019 on evaluation today patient actually appears to be doing somewhat better in regard to his ulcer. There is no signs of worsening at this time I did review his culture results which showed evidence of Staphylococcus aureus but not MRSA. Again this could just be more related to the normal skin bacteria  although he states the drainage has slowed down quite a bit he may have had a mild infection not just colonization. And was much smaller and then since around10/04/2019 on evaluation today patient appears to be doing unfortunately worse as far as the size of the wound. I really feel like that this is steadily getting larger again it had been doing excellent right at the beginning of September we have seen a steady increase in the area of the wound it is almost 2-1/2 times the size it was on September 1. Obviously this is a bad trend this is not wanting to see. For that reason we went back to using just the topical triamcinolone cream which does seem to help with inflammation. I checked him for bacteria by way of culture and nothing showed positive there. I am considering giving him a short course of a tapering steroid Dosepak today to see if that is can be beneficial for him. The patient is in agreement with giving that a try. 03/08/2019 on evaluation today patient appears to be doing very well in comparison to last evaluation with regard to his lower extremity ulcer. This is showing signs of less inflammation and actually measuring slightly smaller compared to last time every other week over the past month and a half he has been measuring larger larger larger. Nonetheless I do believe that the issue has been inflammation the prednisone does seem to [facility] ([id number]) have been beneficial for him which is good news. No fevers, chills, nausea, vomiting, or diarrhea. 03/22/2019 on evaluation today patient appears to be doing about the same with regard to his leg ulcer. He has been tolerating the dressing changes without complication. With that being said the wound seems to be mostly arrested at its current size but really is not making any progress except for when we prescribed the prednisone. He did show some signs of dropping as far as  the overall size of the wound during that interval week.  Nonetheless this is something he is not on long-term at this point and unfortunately I think he is getting need either this or else the Humira which his dermatologist has discussed try to get approval for. With that being said he will be seeing his dermatologist on the 11th of this month that is November. 04/19/2019 on evaluation today patient appears to be doing really about the same the wound is measuring slightly larger compared to last time I saw him. He has not been into the office since November 2 due to the fact that he unfortunately had Covid as that his entire family. He tells me that it was rough but they did pull-through and he seems to be doing much better. Fortunately there is no signs of active infection at this time. No fevers, chills, nausea, vomiting, or diarrhea. 05/10/2019 on evaluation today patient unfortunately appears to be doing significantly worse as compared to last time I saw him. He does tell me that he has had his first dose of Humira and actually is scheduled to get the next one in the upcoming week. With that being said he tells me also that in the past several days he has been having a lot of issues with green drainage she showed me a picture this is more blue-green in color. He is also been having issues with increased sloughy buildup and the wound does appear to be larger today. Obviously this is not the direction that we want everything to take based on the starting of his Humira. Nonetheless I think this is definitely a result of likely infection and to be honest I think this is probably Pseudomonas causing the infection based on what I am seeing. 05/24/2019 on evaluation today patient unfortunately appears to be doing significantly worse compared to his prior evaluation with me 2 weeks ago. I did review his culture results which showed that he does have Staph aureus as well as Pseudomonas noted on the culture. Nonetheless the Levaquin that I prescribed for him does  not appear to have been appropriate and in fact he tells me he is no longer experiencing the green drainage and discharge that he had at the last visit. Fortunately there is no signs of active infection at this time which is good news although the wound has significantly worsened it in fact is much deeper than it was previous. We have been utilizing up to this point triamcinolone ointment as the prescription topical of choice but at this time I really feel like that the wound is getting need to be packed in order to appropriately manage this due to the deeper nature of the wound. Therefore something along the lines of an alginate dressing may be more appropriate. 05/31/2019 upon inspection today patient's wound actually showed signs of doing poorly at this point. Unfortunately he just does not seem to be making any good progress despite what we have tried. He actually did go ahead and pick up the Cipro and start taking that as he was noticing more green drainage he had previously completed the Levaquin that I prescribed for him as well. Nonetheless he missed his appointment for the seventh last week on Wednesday with the wound care center and [facility] where his dermatologist referred him. Obviously I do think a second opinion would be helpful at this point especially in light of the fact that the patient seems to be doing so poorly despite the fact  that we have tried everything that I really know how at this point. The only thing that ever seems to have helped him in the past is when he was on high doses of continual steroids that did seem to make a difference for him. 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 06/07/2019 upon evaluation today patient unfortunately appears to be doing worse yet again with regard to his wound. In fact I am  starting to question whether or not he may have a fluid pocket in the muscle at this point based on the bulging and the soft appearance to the central portion of the muscle area. There is not anything draining from the muscle itself at this time which is good news but nonetheless the wound is expanding. I am not really seeing any results of the Humira as far as overall wound progression based on what I am seeing at this point. The patient has been referred for second opinion with regard to his wound to the [facility] wound care center by his dermatologist which I definitely am not in opposition to. Unfortunately we tried multiple dressings in the past including collagen, alginate, and at one point even Hydrofera Blue. With that being said he is never really used it for any significant amount of time due to the fact that he often complains of pain associated with these dressings and then will go back to either using the Santyl which she has done intermittently or more frequently the triamcinolone. He is also using his own compression stockings. We have wrapped him in the past but again that was something else that he really was not a big fan of. 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 He seemed to do much better when he was on the steroids although honestly I understand that the steroids are not necessarily the best medication to be on long-term obviously 06/14/2019 on evaluation today patient appears to be doing actually a little bit better with regard to the overall appearance with his leg. Unfortunately he does continue to have issues with what appears to be some fluid underneath the  muscle although he did see the wound specialty center at [facility] last week their main goals were to see about infusion therapy in place of the Humira as they feel like that is not quite strong enough. They also recommended that we continue with the treatment otherwise as we are they felt like that was appropriate and they are okay with him continuing to follow-up here with [place] in that regard. With that being said they are also sending him to the vein specialist there to see about vein stripping and if that would be of benefit for him. Subsequently they also did not really address whether or not an ultrasound of the muscle area to see if there is anything that needs to be addressed here would be appropriate or not. For that reason I discussed this with him last week I think we  may proceed down that road at this point. 06/21/2019 upon evaluation today patient's wound actually appears to be doing slightly better compared to previous evaluations. I do believe that he has made a difference with regard to the progression here with the use of oral steroids. Again in the past has been the only thing that is really calm things down. He does tell me that from [facility] is gotten a good news from there that there are no further vein stripping that is necessary at this point. I do not have that available for review today although the patient did relay this to me. He also did obtain and have the ultrasound of the wound completed which I did sign off on today. It does appear that there is no fluid collection under the muscle this is likely then just edematous tissue in general. That is also good news. Overall I still believe the inflammation is the main issue here. He did inquire about the possibility of a wound VAC again with the muscle protruding like it is I am not really sure whether the wound VAC is necessarily ideal or not. That is something we will have to consider although I do believe he may need compression wrapping to  try to help with edema control which could potentially be of benefit. 06/28/2019 on evaluation today patient appears to be doing slightly better measurement wise although this is not terribly smaller he least seems to be trending towards that direction. With that being said he still seems to have purulent drainage noted in the wound bed at this time. He has been on Levaquin followed by Cipro over the past month. Unfortunately he still seems to have some issues with active infection at this time. I did perform a culture last week in order to evaluate and see if indeed there was still anything going on. Subsequently the culture did come back showing Pseudomonas which is consistent with the drainage has been having which is blue-green in color. He also has had an odor that again was somewhat consistent with Pseudomonas as well. Long story short it appears that the culture showed an intermediate finding with regard to how well the Cipro will work for the Pseudomonas infection. Subsequently being that he does not seem to be clearing up and at best what we are doing is just keeping this at [place] I think he may need to see infectious disease to discuss IV antibiotic options. [name], [name] ([id number]) 07/05/2019 upon evaluation today patient appears to be doing okay in regard to his leg ulcer. He has been tolerating the dressing changes at this point without complication. Fortunately there is no signs of active infection at this time which is good news. No fevers, chills, nausea, vomiting, or diarrhea. With that being said he does have an appointment with infectious disease tomorrow and his primary care on Wednesday. Again the reason for the infectious disease referral was due to the fact that he did not seem to be fully resolving with the use of oral antibiotics and therefore we were thinking that IV antibiotic therapy may be necessary secondary to the fact that there was an intermediate finding for  how effective the Cipro may be. Nonetheless again he has been having a lot of purulent and even green drainage. Fortunately right now that seems to have calmed down over the past week with the reinitiation of the oral antibiotic. Nonetheless we will see what Dr. Megan Salon has to say. 07/12/2019 upon evaluation today patient appears to be  doing about the same at this point in regard to his left lower extremity ulcer. Fortunately there is no signs of active infection at this time which is good news I do believe the Levaquin has been beneficial I did review Dr. Hale Bogus note and to be honest I agree that the patient's leg does appear to be doing better currently. What we found in the past as he does not seem to really completely resolve he will stop the antibiotic and then subsequently things will revert back to having issues with blue-green drainage, increased pain, and overall worsening in general. Obviously that is the reason I sent him back to infectious disease. 07/19/2019 upon evaluation today patient appears to be doing roughly the same in size there is really no dramatic improvement. He has started back on the Levaquin at this point and though he seems to be doing okay he did still have a lot of blue/green drainage noted on evaluation today unfortunately. I think that this is still indicative more likely of a Pseudomonas infection as previously noted and again he does see Dr. Megan Salon in just a couple of days. I do not know that were really able to effectively clear this with just oral antibiotics alone based on what I am seeing currently. Nonetheless we are still continue to try to manage as best we can with regard to the patient and his wound. I do think the wrap was helpful in decreasing the edema which is excellent news. No fevers, chills, nausea, vomiting, or diarrhea. 07/26/2019 upon evaluation today patient appears to be doing slightly better with regard to the overall appearance of the muscle  there is no dark discoloration centrally. Fortunately there is no signs of active infection at this time. No fevers, chills, nausea, vomiting, or diarrhea. Patient's wound bed currently the patient did have an appointment with Dr. Megan Salon at infectious disease last week. With that being said Dr. Megan Salon the patient states was still somewhat hesitant about put him on any IV antibiotics he wanted [place] to repeat cultures today and then see where things go going forward. He does look like Dr. Megan Salon because of some improvement the patient did have with the Levaquin wanted [place] to see about repeating cultures. If it indeed grows the Pseudomonas again then he recommended a possibility of considering a PICC line placement and IV antibiotic therapy. He plans to see the patient back in 1 to 2 weeks. 08/02/2019 upon evaluation today patient appears to be doing poorly with regard to his left lower extremity. We did get the results of his culture back it shows that he is still showing evidence of Pseudomonas which is consistent with the purulent/blue-green drainage that he has currently. Subsequently the culture also shows that he now is showing resistance to the oral fluoroquinolones which is unfortunate as that was really the only thing to treat the infection prior. I do believe that he is looking like this is going require IV antibiotic therapy to get this under control. Fortunately there is no signs of systemic infection at this time which is good news. The patient does see Dr. Megan Salon tomorrow. 08/09/2019 upon evaluation today patient appears to be doing better with regard to his left lower extremity ulcer in regard to the overall appearance. He is currently on IV antibiotic therapy. As ordered by Dr. Megan Salon. Currently the patient is on ceftazidime which she is going to take for the next 2 weeks and then follow-up for 4 to 5-week appointment with Dr. Megan Salon.  The patient started this this past Friday  symptoms have not for a total of 3 days currently in full. 08/16/2019 upon evaluation today patient's wound actually does show muscle in the base of the wound but in general does appear to be much better as far as the overall evidence of infection is concerned. In fact I feel like this is for the most part cleared up he still on the IV antibiotics he has not completed the full course yet but I think he is doing much better which is excellent news. 08/23/2019 upon evaluation today patient appears to be doing about the same with regard to his wound at this point. He tells me that he still has pain unfortunately. Fortunately there is no evidence of systemic infection at this time which is great news. There is significant muscle protrusion. 09/13/19 upon evaluation today patient appears to be doing about the same in regard to his leg unfortunately. He still has a lot of drainage coming from the ulceration there is still muscle exposed. With that being said the patient's last wound culture still showed an intermediate finding with regard to the Pseudomonas he still having the bluish/green drainage as well. Overall I do not know that the wound has completely cleared of infection at this point. Fortunately there is no signs of active infection systemically at this point which is good news. 09/20/2019 upon evaluation today patient's wound actually appears to be doing about the same based on what I am seeing currently. I do not see any signs of systemic infection he still does have evidence of some local infection and drainage. He did see Dr. Megan Salon last week and Dr. Megan Salon states that he probably does need a different IV antibiotic although he does not want to put him on this until the patient begins the Remicade infusion which is actually scheduled for about 10 days out from today on 13 May. Following that time Dr. Megan Salon is good to see him back and then will evaluate the feasibility of starting him on the  IV antibiotic therapy once again at that point. I do not disagree with this plan I do believe as Dr. Megan Salon stated in his note that I reviewed today that the patient's issue is multifactorial with the pyoderma being 1 aspect of this that were hoping the Remicade will be helpful for her. In the meantime I think the gentamicin is, helping to keep things under decent okay control in regard to the ulcer. 09/27/2019 upon evaluation today patient appears to be doing about the same with regard to his wound still there is a lot of muscle exposure though he does have some hyper granulation tissue noted around the edge and actually some granulation tissue starting to form over the muscle which is actually good news. Fortunately there is no evidence of active infection which is also good news. His pain is less at this point. 5/21; this is a patient I have not seen in a long time. 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 He has a large wound on the left lateral leg with protruding muscle. He comes in the clinic today showing the same area on his left medial ankle. He says there is been a spot there for some time although we have not previously defined this. Today he has a clearly defined area with slight amount of skin breakdown surrounded by raised areas with a purplish hue in color. This is not painful he says it is irritated.  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 He still has muscle protruding from the lateral portion of his left leg but fortunately the new area that they were concerned about at his last visit does not appear to have opened at this point. He is currently on Remicade infusions and seems to be doing better in my opinion in fact the wound itself seems to be overall much better. The purplish discoloration that he did have seems to have resolved  and I think that is a good sign that hopefully the Remicade is doing its job. He does have some biofilm noted over the surface of the wound. 11/01/2019 on evaluation today patient's wound actually appears to be doing excellent at this time. Fortunately there is no evidence of active infection and overall I feel like he is making great progress. The Remicade seems to be due excellent job in my opinion. [name], [name] ([id number]) 11/08/19 evaluation today vision actually appears to be doing quite well with regard to his weight ulcer. He's been tolerating dressing changes without complication. Fortunately there is no evidence of infection. No fevers, chills, nausea, or vomiting noted at this time. Overall states that is having more itching than pain which is actually a good sign in my opinion. 12/13/2019 upon evaluation today patient appears to be doing well today with regard to his wound. He has been tolerating the dressing changes without complication. Fortunately there is no sign of active infection at this time. No fevers, chills, nausea, vomiting, or diarrhea. Overall I feel like the infusion therapy has been very beneficial for him. 01/06/2020 on evaluation today patient appears to be doing well with regard to his wound. This is measuring smaller and actually looks to be doing better. Fortunately there is no signs of active infection at this point. No fevers, chills, nausea, vomiting, or diarrhea. With that being said he does still have the blue-green drainage but this does not seem to be causing any significant issues currently. He has been using the gentamicin that does seem to be keeping things under decent control at this point. He goes later this morning for his next infusion therapy for the pyoderma which seems to also be very beneficial. 02/07/2020 on evaluation today patient appears to be doing about the same in regard to his wounds currently. Fortunately there is no signs of active infection  systemically he does still have evidence of local infection still using gentamicin. He also is showing some signs of improvement albeit slowly I do feel like we are making some progress here. 02/21/2020 upon evaluation today patient appears to be making some signs of improvement the wound is measuring a little bit smaller which is great news and overall I am very pleased with where he stands currently. He is going to be having infusion therapy treatment on the 15th of this month. Fortunately there is no signs of active infection at this time. 03/13/2020 I do believe patient's wound is actually showing some signs of improvement here which is great news. He has continue with the infusion therapy through rheumatology/dermatology at [facility]. That does seem to be beneficial. I still think he gets as much benefit from this as he did from the prednisone initially but nonetheless obviously this is less harsh on his body that the prednisone as far as they are concerned. Electronic Signature(s) Signed: 03/13/2020 10:16:46 AM By: Worthy Keeler PA-C Previous Signature: 03/13/2020 9:54:53 AM  Version By: Worthy Keeler PA-C Entered By: Worthy Keeler on 03/13/2020 10:16:46 [name], [name] ([id number]) -------------------------------------------------------------------------------- Physical Exam Details Patient Name: KADE, DEMICCO Date of Service: 03/13/2020 9:00 AM Medical Record Number: [id number] Patient Account Number: [id number] Date of Birth/Sex: [birth year] (39 y.o. M) Treating RN: [name] Primary Care Provider: [name] Other Clinician: Referring Provider: [name] Treating Provider/Extender: [name] Cliche in Treatment: 39 Constitutional Well-nourished and well-hydrated in no acute distress. Respiratory normal breathing without difficulty. Psychiatric this patient is able to make decisions and demonstrates good insight into disease process. Alert and Oriented x 3. pleasant and  cooperative. Notes Patient's wound bed actually showed signs of being fairly clean at this point. There does not appear to be any signs of active infection which is great news and in general I am extremely pleased with where we stand at this time. Electronic Signature(s) Signed: 03/13/2020 10:17:49 AM By: Worthy Keeler PA-C Previous Signature: 03/13/2020 9:56:58 AM Version By: Worthy Keeler PA-C Entered By: Worthy Keeler on 03/13/2020 10:17:48 [name] ([id number]) -------------------------------------------------------------------------------- Physician Orders Details Patient Name: [name] Date of Service: 03/13/2020 9:00 AM Medical Record Number: [id number] Patient Account Number: [id number] Date of Birth/Sex: [birth year] (39 y.o. M) Treating RN: [name] Primary Care Provider: [name] Other Clinician: Referring Provider: [name] Treating Provider/Extender: [name] Cliche in Treatment: [phone] Verbal / Phone Orders: No Diagnosis Coding Wound Cleansing Wound #1 Left,Lateral Lower Leg o Clean wound with Normal Saline. Anesthetic (add to Medication List) Wound #1 Left,Lateral Lower Leg o Topical Lidocaine 4% cream applied to wound bed prior to debridement (In Clinic Only). Primary Wound Dressing o Gentamicin Sulfate Cream Wound #1 Left,Lateral Lower Leg o Collagen Secondary Dressing Wound #1 Left,Lateral Lower Leg o Boardered Foam Dressing Dressing Change Frequency Wound #1 Left,Lateral Lower Leg o Change dressing every day. Follow-up Appointments Wound #1 Left,Lateral Lower Leg o Return Appointment in 3 weeks. Edema Control Wound #1 Left,Lateral Lower Leg o Patient to wear own compression stockings o Elevate legs to the level of the heart and pump ankles as often as possible Electronic Signature(s) Signed: 03/13/2020 4:31:22 PM By: Worthy Keeler PA-C Signed: 03/13/2020 6:06:12 PM By: [name], [name], RN, [name], [name],  [name] Entered By: [name], [name], RN, [name], [name] on 03/13/2020 09:11:23 [name] ([id number]) -------------------------------------------------------------------------------- Problem List Details Patient Name: [name] Date of Service: 03/13/2020 9:00 AM Medical Record Number: [id number] Patient Account Number: [id number] Date of Birth/Sex: Aug 29, 1980 (39 y.o. M) Treating RN: [name] Primary Care Provider: [name] Other Clinician: Referring Provider: [name] Treating Provider/Extender: [name] Cliche in Treatment: [phone] Active Problems ICD-10 Encounter Code Description Active Date MDM Diagnosis L97.222 Non-pressure chronic ulcer of left calf with fat layer exposed 12/04/2016 No Yes L88 Pyoderma gangrenosum 03/26/2017 No Yes L97.321 Non-pressure chronic ulcer of left ankle limited to breakdown of skin 10/08/2019 No Yes I87.2 Venous insufficiency (chronic) (peripheral) 12/04/2016 No Yes L03.116 Cellulitis of left lower limb 05/24/2019 No Yes Inactive Problems ICD-10 Code Description Active Date Inactive Date L97.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 M) Treating RN: [name] Primary Care Provider: [name] Other Clinician: Referring Provider: Carlis Abbott,  Katherine Treating Provider/Extender: Jeri Cos Weeks in Treatment: 170 Subjective Chief Complaint Information obtained from Patient He is here in follow up evaluation for LLE pyoderma ulcer History of Present  Illness (HPI) 12/04/16; 39 year old man who comes into the clinic today for review of a wound on the posterior left calf. He tells me that is been there for about a year. He is not a diabetic he does smoke half a pack per day. He was seen in the ER on 11/20/16 felt to have cellulitis around the wound and was given clindamycin. An x-ray did not show osteomyelitis. The patient initially tells me that he has a milk allergy that sets off a pruritic itching rash on his lower legs which she scratches incessantly and he thinks that's what may have set up the wound. He has been using various topical antibiotics and ointments without any effect. He works in a [employer] and is on his feet all day. He does not have a prior history of wounds however he does have the rash on both lower legs the right arm and the ventral aspect of his left arm. These are excoriations and clearly have had scratching however there are of macular looking areas on both legs including a substantial larger area on the right leg. This does not have an underlying open area. There is no blistering. The patient tells me that 2 years ago in [place] in response to the rash on his legs he saw a dermatologist who told him he had a condition which may be pyoderma gangrenosum although I may be putting words into his mouth. He seemed to recognize this. 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 He is not in any treatment for this. He's had no recent travel 12/11/16; the patient arrives today with his wound and roughly the same condition we've been using silver alginate this is a deep punched out wound with some surrounding erythema but no tenderness. Biopsy I did did not show confirmed pyoderma gangrenosum suggested nonspecific inflammation and vasculitis but does not provide an actual description of what was seen by the pathologist. I'm really not able to understand this We have also received information  from the patient's dermatologist in [place] notes from April 2016. This was a doctor Agarwal-antal. 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 He was prescribed topical steroid high potency under occlusion which helped but at this point the patient did not have a deep punched out wound. 12/18/16; the patient's wound is larger in terms of surface area however this surface looks better and there is less depth. The surrounding erythema also is better. The patient states that the wrap we put on came off 2 days ago when he has been using his compression stockings. He we are in the process of getting a dermatology consult. 12/26/16 on evaluation today patient's left lower extremity wound shows evidence of infection with surrounding erythema noted. He has been tolerating the dressing changes but states that he has noted more discomfort. There is a larger area of erythema surrounding the wound. No fevers, chills, nausea, or vomiting noted at this time. With that being said the wound still does have slough covering the surface. He is not allergic to any medication that he is aware of at this point. In regard to his right lower extremity he had several regions that are erythematous and pruritic he wonders if there's anything we can do to help that. 01/02/17 I reviewed patient's  wound culture which was obtained his visit last week. He was placed on doxycycline at that point. Unfortunately that does not appear to be an antibiotic that would likely help with the situation however the pseudomonas noted on culture is sensitive to Cipro. Also unfortunately patient's wound seems to have a large compared to last week's evaluation. Not severely so but there are definitely increased measurements in general. He is continuing to have discomfort as well he writes this to be a seven out of 10. In fact he would prefer me not to perform any debridement today due to the fact that he is having discomfort and considering  he has an active infection on the little reluctant to do so anyway. No fevers, chills, nausea, or vomiting noted at this time. 01/08/17; patient seems dermatology on September 5. I suspect dermatology will want the slides from the biopsy I did sent to their pathologist. I'm not sure if there is a way we can expedite that. In any case the culture I did before I left on vacation 3 weeks ago showed Pseudomonas he was given 10 days of Cipro and per her description of her intake nurses is actually somewhat better this week although the wound is quite a bit bigger than I remember the last time I saw this. He still has 3 more days of Cipro 01/21/17; dermatology appointment tomorrow. He has completed the ciprofloxacin for Pseudomonas. Surface of the wound looks better however he is had some deterioration in the lesions on his right leg. Meantime the left lateral leg wound we will continue with sample 01/29/17; patient had his dermatology appointment but I can't yet see that note. He is completed his antibiotics. The wound is more superficial but considerably larger in circumferential area than when he came in. This is in his left lateral calf. He also has swollen erythematous areas with superficial wounds on the right leg and small papular areas on both arms. There apparently areas in her his upper thighs and buttocks I did not look at those. Dermatology biopsied the right leg. Hopefully will have their input next week. 02/05/17; patient went back to see his dermatologist who told him that he had a "scratching problem" as well as staph. He is now on a 30 day course of doxycycline and I believe she gave him triamcinolone cream to the right leg areas to help with the itching [not exactly sure but probably triamcinolone]. She apparently looked at the left lateral leg wound although this was not rebiopsied and I think felt to be ultimately part of the same pathogenesis. He is using sample border foam and changing  nevus himself. He now has a new open area on the right posterior leg which was his biopsy site I don't have any of the dermatology notes 02/12/17; we put the patient in compression last week with SANTYL to the wound on the left leg and the biopsy. Edema is much better and the depth of the wound is now at level of skin. Area is still the same Biopsy site on the right lateral leg we've also been using santyl with a border foam dressing and he is changing this himself. 02/19/17; Using silver alginate started last week to both the substantial left leg wound and the biopsy site on the right wound. He is tolerating compression well. Has a an appointment with his primary M.D. tomorrow wondering about diuretics although I'm wondering if the edema problem is actually lymphedema [name], [name] ([id number]) 02/26/17; the patient has been  to see his primary doctor Dr. Jerrel Ivory at [facility] our primary care. She started him on Lasix 20 mg and this seems to have helped with the edema. However we are not making substantial change with the left lateral calf wound and inflammation. The biopsy site on the right leg also looks stable but not really all that different. 03/12/17; the patient has been to see vein and vascular Dr. Lucky Cowboy. He has had venous reflux studies I have not reviewed these. I did get a call from his dermatology office. 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 They therefore recommended clobetasol and tetracycline i.e. 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 03/26/17; apparently the patient just had reflux studies not an appointment with Dr. dew. She arrives in clinic today having applied clobetasol for 2-3 weeks. He  notes over the last 2-3 days excessive drainage having to change the dressing 3-4 times a day and also expanding erythema. 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 Lillia Carmel at [facility] dermatology locally who kindly saw him at my request. 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 My concern was the degree of erythema medially extending from his left leg wound which was either inflammation from pyoderma or cellulitis. I put him on Augmentin however culture of the wound showed Pseudomonas which is quinolone sensitive. I really don't believe he has cellulitis however in view of everything I will continue and give him a course of Cipro. He is also on doxycycline as an immune modulator for the pyoderma. In addition to his original wound on the left lateral leg with surrounding erythema he has a wound on the right posterior calf which was an original biopsy site done by dermatology. 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 Saw Dr. Lillia Carmel yesterday. He has been using topical antibiotics to both wound areas his original wound on the left and the biopsies/pathergy area on the right. There is definitely some improvement in the inflammation around the wound on the right although the patient states he has increasing sensitivity of the wounds. He is on prednisone 60 and doxycycline 1 as prescribed by Dr. Lillia Carmel. He is covering the topical antibiotic with gauze and putting this in his own compression stocks and changing this daily. He states that Dr. Lottie Rater did a culture of the left leg wound yesterday 05/07/17; pyoderma gangrenosum. The  patient saw Dr. Lillia Carmel yesterday and has a follow-up with her in one month. He is still using topical antibiotics to both wounds although he can't recall exactly what type. He is still on prednisone 60 mg. Dr. Lillia Carmel stated that the doxycycline could stop if we were in agreement. 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 The right medial leg was induced by biopsy/pathergy. The area on the right is essentially healed. Still on  high-dose prednisone using topical antibiotics to the wound 07/09/17; pyoderma gangrenosum with wounds on the left lateral leg. The right medial leg has closed and remains closed. He is still on prednisone 60. He tells me he missed his last dermatology appointment with Dr. Lillia Carmel but will make another appointment. He reports that her blood sugar at a recent screen in [place] was high 200's. He was 180 today. 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 I think he is going to require still much longer prednisone perhaps another 3 months before attempting to taper. In the meantime his wound is a lot better. Smaller. He is cleaning this off daily and applying topical antibiotics. When he was last in the clinic I thought about changing to [facility] and actually put in a couple of calls to dermatology although probably not during their business hours. 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 He continues on oral prednisone. He has been using triple antibiotic ointment. There is surface debris and we will transition to [facility] and have him return in 2 weeks. He has lost 30 pounds since his last appointment with lifestyle modification. He may benefit from topical steroid cream for treatment this can be considered at a later date. 08/22/17 on  evaluation today patient appears to actually be doing rather well in regard to his left lateral lower extremity ulcer. He has actually been managed by Dr. Dellia Nims most recently. Patient is currently on oral steroids at this time. This seems to have been of benefit for him. Nonetheless his last visit was actually with Leah on 08/06/17. Currently he is not utilizing any topical steroid creams although this could be of benefit as well. No fevers, chills, nausea, or vomiting noted at this time. 09/05/17 on evaluation today patient appears to be doing better in regard to his left lateral lower extremity ulcer. He has been tolerating the dressing changes without complication. He is using Santyl with good effect. Overall I'm very pleased with how things are standing at this point. Patient likewise is happy that this is doing better. 09/19/17 on evaluation today patient actually appears to be doing rather well in regard to his left lateral lower extremity ulcer. 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 He's not having any significant pain. 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 He has been tolerating the Santyl without complication and in general I feel like he's making good progress. 10/17/17 on evaluation today patient appears to be doing very well in regard to his left lateral lower surety ulcer. He has been tolerating the dressing changes without complication. There does not appear to be any evidence of infection he's alternating the Santyl and the triple antibiotic ointment every other day this seems to be doing well for him. 11/03/17 on evaluation today patient appears to be doing very well in regard to his left lateral lower extremity ulcer. He is been tolerating the dressing changes without complication which is good  news. Fortunately there does not appear to be any evidence of infection which is also great news. Overall is doing excellent they are starting to taper down on the prednisone is down to 40 mg at  this point it also started topical clobetasol for him. 11/17/17 on evaluation today patient appears to be doing well in regard to his left lateral lower surety ulcer. He's been tolerating the dressing changes without complication. He does note that he is having no pain, no excessive drainage or discharge, and overall he feels like things are going about how he would expect and hope they would. Overall he seems to have no evidence of infection at this time in my opinion which is good news. 12/04/17-He is seen in follow-up evaluation for right lateral lower extremity ulcer. He has been applying topical steroid cream. Today's measurement show slight increase in size. Over the next 2 weeks we will transition to every other day Santyl and steroid cream. He has been encouraged to monitor for changes and notify clinic with any concerns 12/15/17 on evaluation today patient's left lateral motion the ulcer and fortunately is doing worse again at this point. This just since last week to this week has close to doubled in size according to the patient. I did not seeing last week's I do not have a visual to compare this to in our system was also down so we do not have all the charts and at this point. Nonetheless it does have me somewhat concerned in regard to the fact that again he was worried enough about it he has contact the dermatology that placed them back on the full strength, 50 mg a day of the prednisone that he was taken previous. He continues to alternate using clobetasol along with Santyl at this point. He is obviously somewhat frustrated. [name], [name] ([id number]) 12/22/17 on evaluation today patient appears to be doing a little worse compared to last evaluation. Unfortunately the wound is a little deeper  and slightly larger than the last week's evaluation. With that being said he has made some progress in regard to the irritation surrounding at this time unfortunately despite that progress that's been made he still has a significant issue going on here. 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 For that reason I discussed with him today the possibility of perform a wound culture to ensure there's nothing overtly infected. 01/06/18 on evaluation today patient's wound is larger and deeper than previously evaluated. With that being said it did appear that his wound was infected after my last evaluation with him. Subsequently I did end up prescribing a prescription for Bactrim DS which she has been taking and having no complication with. Fortunately there does not appear to be any evidence of infection at this point in time as far as anything spreading, no want to touch, and overall I feel like things are showing signs of improvement. 01/13/18 on evaluation today patient appears to be even a little larger and deeper than last time. There still muscle exposed in the base of the wound. Nonetheless he does appear to be less erythematous I do believe inflammation is calming down also believe the infection looks like it's probably resolved at this time based on what I'm seeing. No fevers, chills, nausea, or vomiting noted at this time. 01/30/18 on evaluation today patient actually appears to visually look better for the most part. Unfortunately those visually this looks better he does seem to potentially have what may be an abscess in the muscle that has been noted in the central portion of the wound. This is  the first time that I have noted what appears to be fluctuance in the central portion of the muscle. With that being said I'm somewhat more concerned  about the fact that this might indicate an abscess formation at this location. I do believe that an ultrasound would be appropriate. This is likely something we need to try to do as soon as possible. He has been switch to mupirocin ointment and he is no longer using the steroid ointment as prescribed by dermatology he sees them again next week he's been decreased from 60 to 40 mg of prednisone. 03/09/18 on evaluation today patient actually appears to be doing a little better compared to last time I saw him. There's not as much erythema surrounding the wound itself. He I did review his most recent infectious disease note which was dated 02/24/18. He saw Dr. Michel Bickers in [place]. 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 Patient is now off of antibiotics and they are continually observing this. There seems to be no change in the past two weeks in my pinion based on what the patient says and what I see today compared to what Dr. Megan Salon likely saw two weeks ago. No fevers, chills, nausea, or vomiting noted at this time. 03/23/18 on evaluation today patient's wound actually appears to be showing signs of improvement which is good news. He is currently still on the Dapsone. He is also working on tapering the prednisone to get off of this and Dr. Lottie Rater is working with him in this regard. Nonetheless overall I feel like the wound is doing well it does appear based on the infectious disease note that I reviewed from Dr. 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 I did also review the results of his ultrasound of left lower extremity which revealed there was a dentist tissue in the base of the wound without an abscess noted. 04/06/18 on evaluation today the patient's left lateral lower extremity  ulcer actually appears to be doing fairly well which is excellent news. There does not appear to be any evidence of infection at this time which is also great news. Overall he still does have a significantly large ulceration although little by little he seems to be making progress. He is down to 10 mg a day of the prednisone. 04/20/18 on evaluation today patient actually appears to be doing excellent at this time in regard to his left lower extremity ulcer. He's making signs of good progress unfortunately this is taking much longer than we would really like to see but nonetheless he is making progress. Fortunately there does not appear to be any evidence of infection at this time. No fevers, chills, nausea, or vomiting noted at this time. The patient has not been using the Santyl due to the cost he hadn't got in this field yet. He's mainly been using the antibiotic ointment topically. Subsequently he also tells me that he really has not been scrubbing in the shower I think this would be helpful again as I told him it doesn't have to be anything too aggressive to even make it believe just enough to keep it free of some of the loose slough and biofilm on the wound surface. 05/11/18 on evaluation today patient's wound appears to be making slow but  sure progress in regard to the left lateral lower extremity ulcer. He is been tolerating the dressing changes without complication. Fortunately there does not appear to be any evidence of infection at this time. He is still just using triple antibiotic ointment along with clobetasol occasionally over the area. He never got the Santyl and really does not seem to intend to in my pinion. 06/01/18 on evaluation today patient appears to be doing a little better in regard to his left lateral lower extremity ulcer. He states that overall he does not feel like he is doing as well with the Dapsone as he did with the prednisone. Nonetheless he sees his dermatologist later  today and is gonna talk to them about the possibility of going back on the prednisone. Overall again I believe that the wound would be better if you would utilize Santyl but he really does not seem to be interested in going back to the [place] at this point. He has been using triple antibiotic ointment. 06/15/18 on evaluation today patient's wound actually appears to be doing about the same at this point. Fortunately there is no signs of infection at this time. He has made slight improvements although he continues to not really want to clean the wound bed at this point. He states that he just doesn't mess with it he doesn't want to cause any problems with everything else he has going on. He has been on medication, antibiotics as prescribed by his dermatologist, for a staff infection of his lower extremities which is really drying out now and looking much better he tells me. Fortunately there is no sign of overall infection. 06/29/18 on evaluation today patient appears to be doing well in regard to his left lateral lower surety ulcer all things considering. Fortunately his staff infection seems to be greatly improved compared to previous. He has no signs of infection and this is drying up quite nicely. He is still the doxycycline for this is no longer on cental, Dapsone, or any of the other medications. His dermatologist has recommended possibility of an infusion but right now he does not want to proceed with that. 07/13/18 on evaluation today patient appears to be doing about the same in regard to his left lateral lower surety ulcer. Fortunately there's no signs of infection at this time which is great news. Unfortunately he still builds up a significant amount of Slough/biofilm of the surface of the wound he still is not really cleaning this as he should be appropriately. Again I'm able to easily with saline and gauze remove the majority of this on the surface which if you would do this at home would  likely be a dramatic improvement for him as far as getting the area to improve. Nonetheless overall I still feel like he is making progress is just very slow. I think Santyl will be of benefit for him as well. Still he has not gotten this as of this point. 07/27/18 on evaluation today patient actually appears to be doing little worse in regards of the erythema around the periwound region of the wound he also tells me that he's been having more drainage currently compared to what he was experiencing last time I saw him. He states not quite as bad as what he had because this was infected previously but nonetheless is still appears to be doing poorly. Fortunately there is no evidence of systemic infection at this point. The patient tells me that he is not going to be able to  afford the Santyl. He is still waiting to hear about the infusion therapy with his dermatologist. Apparently she wants an updated colonoscopy first. PHILLIPS, GOULETTE ([id number]) 08/10/18 on evaluation today patient appears to be doing better in regard to his left lateral lower extremity ulcer. Fortunately he is showing signs of improvement in this regard he's actually been approved for Remicade infusion's as well although this has not been scheduled as of yet. Fortunately there's no signs of active infection at this time in regard to the wound although he is having some issues with infection of the right lower extremity is been seen as dermatologist for this. Fortunately they are definitely still working with him trying to keep things under control. 09/07/18 on evaluation today patient is actually doing rather well in regard to his left lateral lower extremity ulcer. He notes these actually having some hair grow back on his extremity which is something he has not seen in years. He also tells me that the pain is really not giving them any trouble at this time which is also good news overall she is very pleased with the progress he's using a  combination of the mupirocin along with the probate is all mixed. 09/21/18 on evaluation today patient actually appears to be doing fairly well all things considered in regard to his looks from the ulcer. He's been tolerating the dressing changes without complication. Fortunately there's no signs of active infection at this time which is good news he is still on all antibiotics or prevention of the staff infection. He has been on prednisone for time although he states it is gonna contact his dermatologist and see if she put them on a short course due to some irritation that he has going on currently. Fortunately there's no evidence of any overall worsening this is going very slow I think cental would be something that would be helpful for him although he states that $50 for tube is quite expensive. He therefore is not willing to get that at this point. 10/06/18 on evaluation today patient actually appears to be doing decently well in regard to his left lateral leg ulcer. He's been tolerating the dressing changes without complication. Fortunately there's no signs of active infection at this time. Overall I'm actually rather pleased with the progress he's making although it's slow he doesn't show any signs of infection and he does seem to be making some improvement. I do believe that he may need a switch up and dressings to try to help this to heal more appropriately and quickly. 10/19/18 on evaluation today patient actually appears to be doing better in regard to his left lateral lower extremity ulcer. This is shown signs of having much less Slough buildup at this point due to the fact he has been using the [employer]. Obviously this is very good news. The overall size of the wound is not dramatically smaller but again the appearance is. 11/02/18 on evaluation today patient actually appears to be doing quite well in regard to his lower [place] ulcer. A lot of the skin around the ulcer is actually somewhat  irritating at this point this seems to be more due to the dressing causing irritation from the adhesive that anything else. Fortunately there is no signs of active infection at this time. 11/24/18 on evaluation today patient appears to be doing a little worse in regard to his overall appearance of his lower extremity ulcer. There's more erythema and warmth around the wound unfortunately. He is currently on doxycycline  which he has been on for some time. With that being said I'm not sure that seems to be helping with what appears to possibly be an acute cellulitis with regard to his left lower extremity ulcer. No fevers, chills, nausea, or vomiting noted at this time. 12/08/18 on evaluation today patient's wounds actually appears to be doing significantly better compared to his last evaluation. He has been using Santyl along with alternating tripling about appointment as well as the steroid cream seems to be doing quite well and the wound is showing signs of improvement which is excellent news. Fortunately there's no evidence of infection and in fact his culture came back negative with only normal skin flora noted. 12/21/2018 upon evaluation today patient actually appears to be doing excellent with regard to his ulcer. This is actually the best that I have seen it since have been helping to take care of him. It is both smaller as well as less slough noted on the surface of the wound and seems to be showing signs of good improvement with new skin growing from the edges. He has been using just the triamcinolone he does wonder if he can get a refill of that ointment today. 01/04/2019 upon evaluation today patient actually appears to be doing well with regard to his left lateral lower extremity ulcer. With that being said it does not appear to be that he is doing quite as well as last time as far as progression is concerned. There does not appear to be any signs of infection or significant irritation which is  good news. With that being said I do believe that he may benefit from switching to a collagen based dressing based on how clean The wound appears. 01/18/2019 on evaluation today patient actually appears to be doing well with regard to his wound on the left lower extremity. He is not made a lot of progress compared to where we were previous but nonetheless does seem to be doing okay at this time which is good news. There is no signs of active infection which is also good news. My only concern currently is I do wish we can get him into utilizing the collagen dressing his insurance would not pay for the supplies that we ordered although it appears that he may be able to order this through his supply company that he typically utilizes. This is [employer]. Nonetheless he did try to order it during the office visit today and it appears this did go through. We will see if he can get that it is a different brand but nonetheless he has collagen and I do think will be beneficial. 02/01/2019 on evaluation today patient actually appears to be doing a little worse today in regard to the overall size of his wounds. Fortunately there is no signs of active infection at this time. That is visually. Nonetheless when this is happened before it was due to infection. For that reason were somewhat concerned about that this time as well. 02/08/2019 on evaluation today patient unfortunately appears to be doing slightly worse with regard to his wound upon evaluation today. Is measuring a little deeper and a little larger unfortunately. I am not really sure exactly what is causing this to enlarge he actually did see his dermatologist she is going to see about initiating Humira for him. Subsequently she also did do steroid injections into the wound itself in the periphery. Nonetheless still nonetheless he seems to be getting a little bit larger he is gone back  to just using the steroid cream topically which I think is appropriate. I  would say hold off on the collagen for the time being is definitely a good thing to do. Based on the culture results which we finally did get the final result back regarding it shows staph as the bacteria noted again that can be a normal skin bacteria based on the fact however he is having increased drainage and worsening of the wound measurement wise I would go ahead and place him on an antibiotic today I do believe for this. 02/15/2019 on evaluation today patient actually appears to be doing somewhat better in regard to his ulcer. There is no signs of worsening at this time I did review his culture results which showed evidence of Staphylococcus aureus but not MRSA. Again this could just be more related to the normal skin bacteria although he states the drainage has slowed down quite a bit he may have had a mild infection not just colonization. And was much smaller and then since around10/04/2019 on evaluation today patient appears to be doing unfortunately worse as far as the size of the wound. I really feel like that this is steadily getting larger again it had been doing excellent right at the beginning of September we have seen a steady increase in the area of the wound it is almost 2-1/2 times the size it was on September 1. Obviously this is a bad trend this is not wanting to see. For that reason we went back to using just the topical triamcinolone cream which does seem to help with inflammation. I checked him for bacteria by way of culture and nothing showed positive there. I am considering giving him a short course of a tapering steroid Rece Zechman, Djimon ([id number]) today to see if that is can be beneficial for him. The patient is in agreement with giving that a try. 03/08/2019 on evaluation today patient appears to be doing very well in comparison to last evaluation with regard to his lower extremity ulcer. This is showing signs of less inflammation and actually measuring slightly  smaller compared to last time every other week over the past month and a half he has been measuring larger larger larger. Nonetheless I do believe that the issue has been inflammation the prednisone does seem to have been beneficial for him which is good news. No fevers, chills, nausea, vomiting, or diarrhea. 03/22/2019 on evaluation today patient appears to be doing about the same with regard to his leg ulcer. He has been tolerating the dressing changes without complication. With that being said the wound seems to be mostly arrested at its current size but really is not making any progress except for when we prescribed the prednisone. He did show some signs of dropping as far as the overall size of the wound during that interval week. Nonetheless this is something he is not on long-term at this point and unfortunately I think he is getting need either this or else the Humira which his dermatologist has discussed try to get approval for. With that being said he will be seeing his dermatologist on the 11th of this month that is November. 04/19/2019 on evaluation today patient appears to be doing really about the same the wound is measuring slightly larger compared to last time I saw him. He has not been into the office since November 2 due to the fact that he unfortunately had Covid as that his entire family. He tells me that it was rough  but they did pull-through and he seems to be doing much better. Fortunately there is no signs of active infection at this time. No fevers, chills, nausea, vomiting, or diarrhea. 05/10/2019 on evaluation today patient unfortunately appears to be doing significantly worse as compared to last time I saw him. He does tell me that he has had his first dose of Humira and actually is scheduled to get the next one in the upcoming week. With that being said he tells me also that in the past several days he has been having a lot of issues with green drainage she showed me a  picture this is more blue-green in color. He is also been having issues with increased sloughy buildup and the wound does appear to be larger today. Obviously this is not the direction that we want everything to take based on the starting of his Humira. Nonetheless I think this is definitely a result of likely infection and to be honest I think this is probably Pseudomonas causing the infection based on what I am seeing. 05/24/2019 on evaluation today patient unfortunately appears to be doing significantly worse compared to his prior evaluation with me 2 weeks ago. I did review his culture results which showed that he does have Staph aureus as well as Pseudomonas noted on the culture. Nonetheless the Levaquin that I prescribed for him does not appear to have been appropriate and in fact he tells me he is no longer experiencing the green drainage and discharge that he had at the last visit. Fortunately there is no signs of active infection at this time which is good news although the wound has significantly worsened it in fact is much deeper than it was previous. We have been utilizing up to this point triamcinolone ointment as the prescription topical of choice but at this time I really feel like that the wound is getting need to be packed in order to appropriately manage this due to the deeper nature of the wound. Therefore something along the lines of an alginate dressing may be more appropriate. 05/31/2019 upon inspection today patient's wound actually showed signs of doing poorly at this point. Unfortunately he just does not seem to be making any good progress despite what we have tried. He actually did go ahead and pick up the Cipro and start taking that as he was noticing more green drainage he had previously completed the Levaquin that I prescribed for him as well. Nonetheless he missed his appointment for the seventh last week on Wednesday with the wound care center and [facility] where his  dermatologist referred him. Obviously I do think a second opinion would be helpful at this point especially in light of the fact that the patient seems to be doing so poorly despite the fact that we have tried everything that I really know how at this point. The only thing that ever seems to have helped him in the past is when he was on high doses of continual steroids that did seem to make a difference for him. 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 06/07/2019 upon evaluation today patient unfortunately appears to be doing worse yet again with regard to his wound. In fact I am starting to question whether or not he may have a fluid pocket in the muscle  at this point based on the bulging and the soft appearance to the central portion of the muscle area. There is not anything draining from the muscle itself at this time which is good news but nonetheless the wound is expanding. I am not really seeing any results of the Humira as far as overall wound progression based on what I am seeing at this point. The patient has been referred for second opinion with regard to his wound to the [facility] wound care center by his dermatologist which I definitely am not in opposition to. Unfortunately we tried multiple dressings in the past including collagen, alginate, and at one point even Hydrofera Blue. With that being said he is never really used it for any significant amount of time due to the fact that he often complains of pain associated with these dressings and then will go back to either using the Santyl which she has done intermittently or more frequently the triamcinolone. He is also using his own compression stockings. We have wrapped him in the past but again that was something else that he really was not a big fan of. 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 He seemed to do much better when he was on the steroids although honestly I understand that the steroids are not necessarily the best medication to be on long-term obviously 06/14/2019 on evaluation today patient appears to be doing actually a little bit better with regard to the overall appearance with his leg. Unfortunately he does continue to have issues with what appears to be some fluid underneath the muscle although he did see the wound specialty center at [facility] last week their main goals were to see about infusion therapy in place of the Humira as they feel like that is not quite strong enough. They also recommended that we continue with the treatment otherwise as we are they felt like that was appropriate and they are okay with him continuing to follow-up here with [place] in that regard. With that being said they are also sending him to the vein specialist there to see about vein stripping and if that would be of benefit for him. Subsequently they also did not really address whether or not an ultrasound of the muscle area to see if there is anything that needs to be addressed here would be appropriate or not. For that reason I discussed this with him last week I think we may proceed down that road at this point. 06/21/2019 upon evaluation today patient's wound actually appears to be doing slightly better compared to previous evaluations. I do believe that he has made a difference with regard to the progression here with the use of oral steroids. Again in the past has been the only thing that is really calm things down. He does tell me that from [facility] is gotten a good news  from there that there are no further vein stripping that is necessary at this point. I do not have that available for review today although the patient did relay this to me. He also did obtain and have the ultrasound of the wound completed which I did sign off  on today. It does appear that there is no fluid collection under the muscle this is likely then just edematous tissue in general. That is also good news. Overall I still believe the inflammation is the main issue here. He did inquire about the possibility of a wound VAC again with the muscle protruding like it is I am not really sure whether the wound VAC is necessarily ideal or not. That is something we will have to consider although I do believe he may need compression wrapping to try to help with edema control which could potentially be of benefit. 06/28/2019 on evaluation today patient appears to be doing slightly better measurement wise although this is not terribly smaller he least seems to be trending towards that direction. With that being said he still seems to have purulent drainage noted in the wound bed at this time. He has been on Levaquin followed by Cipro over the past month. Unfortunately he still seems to have some issues with active infection at this time. I did perform a culture last week in order to evaluate and see if indeed there was still anything going on. Subsequently the culture did come back Gerken, [name] ([id number]) showing Pseudomonas which is consistent with the drainage has been having which is blue-green in color. He also has had an odor that again was somewhat consistent with Pseudomonas as well. Long story short it appears that the culture showed an intermediate finding with regard to how well the Cipro will work for the Pseudomonas infection. Subsequently being that he does not seem to be clearing up and at best what we are doing is just keeping this at [place] I think he may need to see infectious disease to discuss  IV antibiotic options. 07/05/2019 upon evaluation today patient appears to be doing okay in regard to his leg ulcer. He has been tolerating the dressing changes at this point without complication. Fortunately there is no signs of active infection at this time which is good news. No fevers, chills, nausea, vomiting, or diarrhea. With that being said he does have an appointment with infectious disease tomorrow and his primary care on Wednesday. Again the reason for the infectious disease referral was due to the fact that he did not seem to be fully resolving with the use of oral antibiotics and therefore we were thinking that IV antibiotic therapy may be necessary secondary to the fact that there was an intermediate finding for how effective the Cipro may be. Nonetheless again he has been having a lot of purulent and even green drainage. Fortunately right now that seems to have calmed down over the past week with the reinitiation of the oral antibiotic. Nonetheless we will see what Dr. Megan Salon has to say. 07/12/2019 upon evaluation today patient appears to be doing about the same at this point in regard to his left lower extremity ulcer. Fortunately there is no signs of active infection at this time which is good news I do believe the Levaquin has been beneficial I did review Dr. Hale Bogus note and to be honest I agree that the patient's leg does appear to be doing better currently. What we found in the past as he does not seem to really completely resolve he will stop the antibiotic and then subsequently things will revert back to having issues with blue-green drainage, increased pain, and overall worsening in general. Obviously that is the reason I sent him back to infectious disease. 07/19/2019 upon evaluation today patient appears to be doing roughly  the same in size there is really no dramatic improvement. He has started back on the Levaquin at this point and though he seems to be doing okay he did  still have a lot of blue/green drainage noted on evaluation today unfortunately. I think that this is still indicative more likely of a Pseudomonas infection as previously noted and again he does see Dr. Megan Salon in just a couple of days. I do not know that were really able to effectively clear this with just oral antibiotics alone based on what I am seeing currently. Nonetheless we are still continue to try to manage as best we can with regard to the patient and his wound. I do think the wrap was helpful in decreasing the edema which is excellent news. No fevers, chills, nausea, vomiting, or diarrhea. 07/26/2019 upon evaluation today patient appears to be doing slightly better with regard to the overall appearance of the muscle there is no dark discoloration centrally. Fortunately there is no signs of active infection at this time. No fevers, chills, nausea, vomiting, or diarrhea. Patient's wound bed currently the patient did have an appointment with Dr. Megan Salon at infectious disease last week. With that being said Dr. Megan Salon the patient states was still somewhat hesitant about put him on any IV antibiotics he wanted [place] to repeat cultures today and then see where things go going forward. He does look like Dr. Megan Salon because of some improvement the patient did have with the Levaquin wanted [place] to see about repeating cultures. If it indeed grows the Pseudomonas again then he recommended a possibility of considering a PICC line placement and IV antibiotic therapy. He plans to see the patient back in 1 to 2 weeks. 08/02/2019 upon evaluation today patient appears to be doing poorly with regard to his left lower extremity. We did get the results of his culture back it shows that he is still showing evidence of Pseudomonas which is consistent with the purulent/blue-green drainage that he has currently. Subsequently the culture also shows that he now is showing resistance to the oral fluoroquinolones which  is unfortunate as that was really the only thing to treat the infection prior. I do believe that he is looking like this is going require IV antibiotic therapy to get this under control. Fortunately there is no signs of systemic infection at this time which is good news. The patient does see Dr. Megan Salon tomorrow. 08/09/2019 upon evaluation today patient appears to be doing better with regard to his left lower extremity ulcer in regard to the overall appearance. He is currently on IV antibiotic therapy. As ordered by Dr. Megan Salon. Currently the patient is on ceftazidime which she is going to take for the next 2 weeks and then follow-up for 4 to 5-week appointment with Dr. Megan Salon. The patient started this this past Friday symptoms have not for a total of 3 days currently in full. 08/16/2019 upon evaluation today patient's wound actually does show muscle in the base of the wound but in general does appear to be much better as far as the overall evidence of infection is concerned. In fact I feel like this is for the most part cleared up he still on the IV antibiotics he has not completed the full course yet but I think he is doing much better which is excellent news. 08/23/2019 upon evaluation today patient appears to be doing about the same with regard to his wound at this point. He tells me that he still has pain unfortunately.  Fortunately there is no evidence of systemic infection at this time which is great news. There is significant muscle protrusion. 09/13/19 upon evaluation today patient appears to be doing about the same in regard to his leg unfortunately. He still has a lot of drainage coming from the ulceration there is still muscle exposed. With that being said the patient's last wound culture still showed an intermediate finding with regard to the Pseudomonas he still having the bluish/green drainage as well. Overall I do not know that the wound has completely cleared of infection at this point.  Fortunately there is no signs of active infection systemically at this point which is good news. 09/20/2019 upon evaluation today patient's wound actually appears to be doing about the same based on what I am seeing currently. I do not see any signs of systemic infection he still does have evidence of some local infection and drainage. He did see Dr. Megan Salon last week and Dr. Megan Salon states that he probably does need a different IV antibiotic although he does not want to put him on this until the patient begins the Remicade infusion which is actually scheduled for about 10 days out from today on 13 May. Following that time Dr. Megan Salon is good to see him back and then will evaluate the feasibility of starting him on the IV antibiotic therapy once again at that point. I do not disagree with this plan I do believe as Dr. Megan Salon stated in his note that I reviewed today that the patient's issue is multifactorial with the pyoderma being 1 aspect of this that were hoping the Remicade will be helpful for her. In the meantime I think the gentamicin is, helping to keep things under decent okay control in regard to the ulcer. 09/27/2019 upon evaluation today patient appears to be doing about the same with regard to his wound still there is a lot of muscle exposure though he does have some hyper granulation tissue noted around the edge and actually some granulation tissue starting to form over the muscle which is actually good news. Fortunately there is no evidence of active infection which is also good news. His pain is less at this point. 5/21; this is a patient I have not seen in a long time. 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 He has a large wound on the left lateral leg with protruding muscle. He comes in the clinic today showing the same area on his left medial ankle. He says there is been a spot there for some time although we have not previously defined this.  Today he has a clearly defined area with slight amount of skin breakdown surrounded by raised areas with a purplish hue in color. This is not painful he says it is irritated. 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 He still has muscle protruding from the lateral portion of his left leg but fortunately the new area that they were concerned about at his last visit does not appear to have opened at this point. He is currently on Remicade infusions and seems to be doing better in my opinion in fact the wound itself seems to be overall much better. The purplish discoloration that he did have seems to have resolved and I think that is a good sign that hopefully the Remicade is doing its job. He does [name], Press ([id number]) have some biofilm noted over the  surface of the wound. 11/01/2019 on evaluation today patient's wound actually appears to be doing excellent at this time. Fortunately there is no evidence of active infection and overall I feel like he is making great progress. The Remicade seems to be due excellent job in my opinion. 11/08/19 evaluation today vision actually appears to be doing quite well with regard to his weight ulcer. He's been tolerating dressing changes without complication. Fortunately there is no evidence of infection. No fevers, chills, nausea, or vomiting noted at this time. Overall states that is having more itching than pain which is actually a good sign in my opinion. 12/13/2019 upon evaluation today patient appears to be doing well today with regard to his wound. He has been tolerating the dressing changes without complication. Fortunately there is no sign of active infection at this time. No fevers, chills, nausea, vomiting, or diarrhea. Overall I feel like the infusion therapy has been very beneficial for him. 01/06/2020 on evaluation today patient appears to be  doing well with regard to his wound. This is measuring smaller and actually looks to be doing better. Fortunately there is no signs of active infection at this point. No fevers, chills, nausea, vomiting, or diarrhea. With that being said he does still have the blue-green drainage but this does not seem to be causing any significant issues currently. He has been using the gentamicin that does seem to be keeping things under decent control at this point. He goes later this morning for his next infusion therapy for the pyoderma which seems to also be very beneficial. 02/07/2020 on evaluation today patient appears to be doing about the same in regard to his wounds currently. Fortunately there is no signs of active infection systemically he does still have evidence of local infection still using gentamicin. He also is showing some signs of improvement albeit slowly I do feel like we are making some progress here. 02/21/2020 upon evaluation today patient appears to be making some signs of improvement the wound is measuring a little bit smaller which is great news and overall I am very pleased with where he stands currently. He is going to be having infusion therapy treatment on the 15th of this month. Fortunately there is no signs of active infection at this time. 03/13/2020 I do believe patient's wound is actually showing some signs of improvement here which is great news. He has continue with the infusion therapy through rheumatology/dermatology at [facility]. That does seem to be beneficial. I still think he gets as much benefit from this as he did from the prednisone initially but nonetheless obviously this is less harsh on his body that the prednisone as far as they are concerned. Objective Constitutional Well-nourished and well-hydrated in no acute distress. Vitals Time Taken: 8:57 AM, Height: 71 in, Weight: 338 lbs, BMI: 47.1, Temperature: 98.3 F, Pulse: 96 bpm, Respiratory Rate: 18 breaths/min, Blood  Pressure: 122/79 mmHg. Respiratory normal breathing without difficulty. Psychiatric this patient is able to make decisions and demonstrates good insight into disease process. Alert and Oriented x 3. pleasant and cooperative. General Notes: Patient's wound bed actually showed signs of being fairly clean at this point. There does not appear to be any signs of active infection which is great news and in general I am extremely pleased with where we stand at this time. Integumentary (Hair, Skin) Wound #1 status is Open. Original cause of wound was Gradually Appeared. The wound is located on the Left,Lateral Lower Leg. The wound measures 6cm length x  5.4cm width x 0.3cm depth; 25.447cm^2 area and 7.634cm^3 volume. There is muscle and Fat Layer (Subcutaneous Tissue) exposed. There is no tunneling or undermining noted. There is a large amount of purulent drainage noted. The wound margin is epibole. There is small (1-33%) pink granulation within the wound bed. There is a large (67-100%) amount of necrotic tissue within the wound bed including Adherent Slough. 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 Anesthetic (add to Medication List): Wound #1 Left,Lateral Lower Leg: Topical Lidocaine 4% cream applied to wound bed prior to debridement (In Clinic Only). Primary Wound Dressing: Gentamicin Sulfate Cream Wound #1 Left,Lateral Lower Leg: Collagen Secondary Dressing: Wound #1 Left,Lateral Lower Leg: Boardered Foam Dressing Dressing Change Frequency: Wound #1 Left,Lateral Lower Leg: Change dressing every day. Follow-up Appointments: Wound #1 Left,Lateral Lower  Leg: Return Appointment in 3 weeks. Edema Control: Wound #1 Left,Lateral Lower Leg: Patient to wear own compression stockings Elevate legs to the level of the heart and pump ankles as often as possible 1. I would recommend at this time that we go ahead and continue with the topical gentamicin although I would like to see about adding plain collagen over the surface of the wound as well. 2. I am also can recommend at this time that the patient continue to clean the wound with Dial antibacterial soap or saline in order to keep this area clean as much as possible. 3. I am also going to recommend that the patient continue to use his compression sock I think is still good to try to keep the edema under control as much as possible he does a good job wearing this. We will see patient back for reevaluation in 3 weeks here in the clinic. If anything worsens or changes patient will contact our office for additional recommendations. Electronic Signature(s) Signed: 03/13/2020 10:18:52 AM By: Worthy Keeler PA-C Previous Signature: 03/13/2020 9:58:48 AM Version By: Worthy Keeler PA-C Entered By: Worthy Keeler on 03/13/2020 10:18:51 [name] ([id number]) -------------------------------------------------------------------------------- SuperBill Details Patient Name: [name] Date of Service: 03/13/2020 Medical Record Number: [id number] Patient Account Number: [id number] Date of Birth/Sex: Jun 03, 1980 (39 y.o. M) Treating RN: [name] Primary Care Provider: [name] Other Clinician: Referring Provider: [name] Treating Provider/Extender: [name] Cliche in Treatment: 170 Diagnosis Coding ICD-10 Codes Code Description [phone] Non-pressure chronic ulcer of left calf with fat layer exposed L88 Pyoderma gangrenosum L97.321 Non-pressure chronic ulcer of left ankle limited to breakdown of skin I87.2 Venous insufficiency (chronic) (peripheral) L03.116 Cellulitis of left  lower limb Facility Procedures CPT4 Code: [id number] Description: 99213 - WOUND CARE VISIT-LEV 3 EST PT Modifier: Quantity: 1 Physician Procedures CPT4 Code: [id number] Description: 14709 - WC PHYS LEVEL 3 - EST PT Modifier: Quantity: 1 CPT4 Code: Description: ICD-10 Diagnosis Description L97.222 Non-pressure chronic ulcer of left calf with fat layer exposed L88 Pyoderma gangrenosum L97.321 Non-pressure chronic ulcer of left ankle limited to breakdown of skin I87.2 Venous insufficiency (chronic)  (peripheral) Modifier: Quantity: Electronic Signature(s) Signed: 03/13/2020 9:59:20 AM By: Worthy Keeler PA-C Entered By: Worthy Keeler on 03/13/2020 09:59:19

## 2020-03-14 NOTE — Progress Notes (Signed)
NISSAN, FRAZZINI ([id number]) Visit Report for 03/13/2020 Arrival Information Details Patient Name: [name], [name] Date of Service: 03/13/2020 9:00 AM Medical Record Number: [id number] Patient Account Number: [id number] Date of Birth/Sex: Sep 28, 1980 (39 y.o. M) Treating RN: [name] Primary Care Shneur [place]: [name] Other Clinician: Referring Glady Ouderkirk: [name] Treating Alanny Rivers/Extender: [name] Cliche in Treatment: 170 Visit Information History Since Last Visit Has Dressing in Place as Prescribed: Yes Patient Arrived: Ambulatory Pain Present Now: Yes Arrival Time: 08:56 Accompanied By: self Transfer Assistance: None Patient Identification Verified: Yes Secondary Verification Process Completed: Yes Patient Requires Transmission-Based No Precautions: Patient Has Alerts: Yes Patient Alerts: 08/09/19 Picc Line Right Electronic Signature(s) Signed: 03/13/2020 6:06:12 PM By: [name], [name], RN, [name], [name], [name] Entered By: [name], [name], RN, [name], [name] on 03/13/2020 08:57:13 [name] ([id number]) -------------------------------------------------------------------------------- Clinic Level of Care Assessment Details Patient Name: [name] Date of Service: 03/13/2020 9:00 AM Medical Record Number: [id number] Patient Account Number: [id number] Date of Birth/Sex: 12/26/1980 (39 y.o. M) Treating RN: [name] Primary Care Roxana Lai: [name] Other Clinician: Referring Artrice Kraker: [name] Treating Kushi Kun/Extender: [name] Cliche in Treatment: 170 Clinic Level of Care Assessment Items TOOL 4 Quantity Score []  - Use when only an EandM is performed on FOLLOW-UP visit 0 ASSESSMENTS - Nursing Assessment / Reassessment X - Reassessment of Co-morbidities (includes updates in patient status) 1 10 X- 1 5 Reassessment of Adherence to Treatment Plan ASSESSMENTS - Wound and Skin Assessment / Reassessment X - Simple Wound Assessment / Reassessment - one wound 1  5 []  - 0 Complex Wound Assessment / Reassessment - multiple wounds []  - 0 Dermatologic / Skin Assessment (not related to wound area) ASSESSMENTS - Focused Assessment []  - Circumferential Edema Measurements - multi extremities 0 []  - 0 Nutritional Assessment / Counseling / Intervention []  - 0 Lower Extremity Assessment (monofilament, tuning fork, pulses) []  - 0 Peripheral Arterial Disease Assessment (using hand held doppler) ASSESSMENTS - Ostomy and/or Continence Assessment and Care []  - Incontinence Assessment and Management 0 []  - 0 Ostomy Care Assessment and Management (repouching, etc.) PROCESS - Coordination of Care X - Simple Patient / Family Education for ongoing care 1 15 []  - 0 Complex (extensive) Patient / Family Education for ongoing care X- 1 10 Staff obtains Consents, Records, Test Results / Process Orders []  - 0 Staff telephones HHA, Nursing Homes / Clarify orders / etc []  - 0 Routine Transfer to another Facility (non-emergent condition) []  - 0 Routine Hospital Admission (non-emergent condition) []  - 0 New Admissions / Biomedical engineer / Ordering NPWT, Apligraf, etc. []  - 0 Emergency Hospital Admission (emergent condition) X- 1 10 Simple Discharge Coordination []  - 0 Complex (extensive) Discharge Coordination PROCESS - Special Needs []  - Pediatric / Minor Patient Management 0 []  - 0 Isolation Patient Management []  - 0 Hearing / Language / Visual special needs []  - 0 Assessment of Community assistance (transportation, D/C planning, etc.) []  - 0 Additional assistance / Altered mentation []  - 0 Support Surface(s) Assessment (bed, cushion, seat, etc.) INTERVENTIONS - Wound Cleansing / Measurement Renner, Dacotah ([id number]) X- 1 5 Simple Wound Cleansing - one wound []  - 0 Complex Wound Cleansing - multiple wounds X- 1 5 Wound Imaging (photographs - any number of wounds) []  - 0 Wound Tracing (instead of photographs) X- 1 5 Simple Wound  Measurement - one wound []  - 0 Complex Wound Measurement - multiple wounds INTERVENTIONS - Wound Dressings []  - Small Wound Dressing one or multiple wounds 0 []  - 0  Medium Wound Dressing one or multiple wounds X- 1 20 Large Wound Dressing one or multiple wounds []  - 0 Application of Medications - topical []  - 0 Application of Medications - injection INTERVENTIONS - Miscellaneous []  - External ear exam 0 []  - 0 Specimen Collection (cultures, biopsies, blood, body fluids, etc.) []  - 0 Specimen(s) / Culture(s) sent or taken to Lab for analysis []  - 0 Patient Transfer (multiple staff / Civil Service fast streamer / Similar devices) []  - 0 Simple Staple / Suture removal (25 or less) []  - 0 Complex Staple / Suture removal (26 or more) []  - 0 Hypo / Hyperglycemic Management (close monitor of Blood Glucose) []  - 0 Ankle / Brachial Index (ABI) - do not check if billed separately X- 1 5 Vital Signs Has the patient been seen at the hospital within the last three years: Yes Total Score: 95 Level Of Care: New/Established - Level 3 Electronic Signature(s) Signed: 03/13/2020 6:06:12 PM By: [name], [name], RN, [name], [name], [name] Entered By: [name], [name], RN, [name], [name] on 03/13/2020 09:10:45 [name] ([id number]) -------------------------------------------------------------------------------- Encounter Discharge Information Details Patient Name: [name] Date of Service: 03/13/2020 9:00 AM Medical Record Number: [id number] Patient Account Number: [id number] Date of Birth/Sex: 04/18/1981 (39 y.o. M) Treating RN: [name] Primary Care Mckinna Demars: [name] Other Clinician: Referring Curtis Cain: [name] Treating Champagne Paletta/Extender: [name] Cliche in Treatment: [phone] Encounter Discharge Information Items Discharge Condition: Stable Ambulatory Status: Ambulatory Discharge Destination: Home Transportation: Other Accompanied By: self Schedule Follow-up Appointment: Yes Clinical Summary of  Care: Electronic Signature(s) Signed: 03/13/2020 6:06:12 PM By: [name], [name], RN, [name], [name], [name] Entered By: [name], [name], RN, [name], [name] on 03/13/2020 09:20:08 [name] ([id number]) -------------------------------------------------------------------------------- Lower Extremity Assessment Details Patient Name: [name] Date of Service: 03/13/2020 9:00 AM Medical Record Number: [id number] Patient Account Number: [id number] Date of Birth/Sex: [birth year] (39 y.o. M) Treating RN: [name] Primary Care Braxden Lovering: [name] Other Clinician: Referring Rhys Lichty: [name] Treating Letia Guidry/Extender: [name] Cliche in Treatment: 170 Edema Assessment Assessed: [Left: No] [Right: No] Edema: [Left: Ye] [Right: s] Calf Left: Right: Point of Measurement: 33 cm From Medial Instep 51 cm Ankle Left: Right: Point of Measurement: 12 cm From Medial Instep 30.2 cm Vascular Assessment Pulses: Dorsalis Pedis Palpable: [Left:Yes] Electronic Signature(s) Signed: 03/13/2020 6:06:12 PM By: [name], [name], RN, [name], [name], [name] Entered By: [name], [name], RN, [name], [name] on 03/13/2020 09:03:28 [name] ([id number]) -------------------------------------------------------------------------------- Multi Wound Chart Details Patient Name: [name] Date of Service: 03/13/2020 9:00 AM Medical Record Number: [id number] Patient Account Number: [id number] Date of Birth/Sex: 1980/10/26 (39 y.o. M) Treating RN: [name] Primary Care Brooklynne Pereida: [name] Other Clinician: Referring Aubryana Vittorio: [name] Treating Forever Arechiga/Extender: [name] Cliche in Treatment: 170 Vital Signs Height(in): 71 Pulse(bpm): 49 Weight(lbs): 338 Blood Pressure(mmHg): 122/79 Body Mass Index(BMI): 47 Temperature(F): 98.3 Respiratory Rate(breaths/min): 18 Photos: [N/A:N/A] Wound Location: Left, Lateral Lower Leg N/A N/A Wounding Event: Gradually Appeared N/A N/A Primary Etiology: Pyoderma N/A  N/A Comorbid History: Sleep Apnea, Hypertension, Colitis N/A N/A Date Acquired: 11/18/2015 N/A N/A Weeks of Treatment: 170 N/A N/A Wound Status: Open N/A N/A Measurements L x W x D (cm) 6x5.4x0.3 N/A N/A Area (cm) : 25.447 N/A N/A Volume (cm) : 7.634 N/A N/A % Reduction in Area: -418.40% N/A N/A % Reduction in Volume: -94.40% N/A N/A Classification: Full Thickness With Exposed N/A N/A Support Structures Exudate Amount: Large N/A N/A Exudate Type: Purulent N/A N/A Exudate Color: yellow, brown, green N/A N/A Wound Margin: Epibole  N/A N/A Granulation Amount: Small (1-33%) N/A N/A Granulation Quality: Pink N/A N/A Necrotic Amount: Large (67-100%) N/A N/A Exposed Structures: Fat Layer (Subcutaneous Tissue): N/A N/A Yes Muscle: Yes Fascia: No Tendon: No Joint: No Bone: No Epithelialization: None N/A N/A Treatment Notes Electronic Signature(s) Signed: 03/13/2020 6:06:12 PM By: [name], [name], RN, [name], [name], [name] Entered By: [name], [name], RN, [name], [name] on 03/13/2020 09:08:56 [name] ([id number]) -------------------------------------------------------------------------------- Multi-Disciplinary Care Plan Details Patient Name: [name] Date of Service: 03/13/2020 9:00 AM Medical Record Number: [id number] Patient Account Number: [id number] Date of Birth/Sex: 03-28-1981 (39 y.o. M) Treating RN: [name] Primary Care Dougles Kimmey: [name] Other Clinician: Referring Dejanique Ruehl: [name] Treating Desarae Placide/Extender: [name] Cliche in Treatment: 170 Active Inactive Venous Leg Ulcer Nursing Diagnoses: Knowledge deficit related to disease process and management Potential for venous Insuffiency (use before diagnosis confirmed) Goals: Non-invasive venous studies are completed as ordered Date Initiated: 12/11/2016 Target Resolution Date: 03/13/2017 Goal Status: Active Patient will maintain optimal edema control Date Initiated: 12/11/2016 Target Resolution Date:  03/13/2017 Goal Status: Active Patient/caregiver will verbalize understanding of disease process and disease management Date Initiated: 12/11/2016 Target Resolution Date: 03/13/2017 Goal Status: Active Verify adequate tissue perfusion prior to therapeutic compression application Date Initiated: 12/11/2016 Target Resolution Date: 03/13/2017 Goal Status: Active Interventions: Assess peripheral edema status every visit. Compression as ordered Provide education on venous insufficiency Treatment Activities: Therapeutic compression applied : 12/11/2016 Notes: Wound/Skin Impairment Nursing Diagnoses: Impaired tissue integrity Knowledge deficit related to ulceration/compromised skin integrity Goals: Patient/caregiver will verbalize understanding of skin care regimen Date Initiated: 12/11/2016 Target Resolution Date: 03/13/2017 Goal Status: Active Ulcer/skin breakdown will have a volume reduction of 30% by week 4 Date Initiated: 12/11/2016 Target Resolution Date: 03/13/2017 Goal Status: Active Ulcer/skin breakdown will have a volume reduction of 50% by week 8 Date Initiated: 12/11/2016 Target Resolution Date: 03/13/2017 Goal Status: Active Ulcer/skin breakdown will have a volume reduction of 80% by week 12 Date Initiated: 12/11/2016 Target Resolution Date: 03/13/2017 Goal Status: Active Ulcer/skin breakdown will heal within 14 weeks Date Initiated: 12/11/2016 Target Resolution Date: 03/13/2017 Goal Status: Active TONG, PIECZYNSKI ([id number]) Interventions: Assess patient/caregiver ability to obtain necessary supplies Assess patient/caregiver ability to perform ulcer/skin care regimen upon admission and as needed Assess ulceration(s) every visit Provide education on ulcer and skin care Treatment Activities: Skin care regimen initiated : 12/11/2016 Topical wound management initiated : 12/11/2016 Notes: Electronic Signature(s) Signed: 03/13/2020 6:06:12 PM By: [name], [name], RN, [name], [name],  [name] Entered By: [name], [name], RN, [name], [name] on 03/13/2020 09:08:42 [name] ([id number]) -------------------------------------------------------------------------------- Pain Assessment Details Patient Name: [name] Date of Service: 03/13/2020 9:00 AM Medical Record Number: [id number] Patient Account Number: [id number] Date of Birth/Sex: Apr 03, 1981 (39 y.o. M) Treating RN: [name] Primary Care Jasen Hartstein: [name] Other Clinician: Referring Maddison Kilner: [name] Treating Laverta Harnisch/Extender: [name] Cliche in Treatment: 170 Active Problems Location of Pain Severity and Description of Pain Patient Has Paino Yes Site Locations Rate the pain. Current Pain Level: 2 Character of Pain Describe the Pain: Tender Pain Management and Medication Current Pain Management: Electronic Signature(s) Signed: 03/13/2020 6:06:12 PM By: [name], [name], RN, [name], [name], [name] Entered By: [name], [name], RN, [name], [name] on 03/13/2020 08:57:47 JEHAD, BISONO ([id number]) -------------------------------------------------------------------------------- Wound Assessment Details Patient Name: [name] Date of Service: 03/13/2020 9:00 AM Medical Record Number: [id number] Patient Account Number: [id number] Date of Birth/Sex: [birth year] (39 y.o. M) Treating RN: [name] Primary Care Maki Hege: [name] Other Clinician: Referring Atiba Kimberlin: [name] Treating Maxden Naji/Extender:  Jeri Cos Weeks in Treatment: 170 Wound Status Wound Number: 1 Primary Etiology: Pyoderma Wound Location: Left, Lateral Lower Leg Wound Status: Open Wounding Event: Gradually Appeared Comorbid History: Sleep Apnea, Hypertension, Colitis Date Acquired: 11/18/2015 Weeks Of Treatment: 170 Clustered Wound: No Photos Wound Measurements Length: (cm) 6 Width: (cm) 5.4 Depth: (cm) 0.3 Area: (cm) 25.447 Volume: (cm) 7.634 % Reduction in Area: -418.4% % Reduction in Volume: -94.4% Epithelialization:  None Tunneling: No Undermining: No Wound Description Classification: Full Thickness With Exposed Support Structures Wound Margin: Epibole Exudate Amount: Large Exudate Type: Purulent Exudate Color: yellow, brown, green Foul Odor After Cleansing: No Slough/Fibrino Yes Wound Bed Granulation Amount: Small (1-33%) Exposed Structure Granulation Quality: Pink Fascia Exposed: No Necrotic Amount: Large (67-100%) Fat Layer (Subcutaneous Tissue) Exposed: Yes Necrotic Quality: Adherent Slough Tendon Exposed: No Muscle Exposed: Yes Necrosis of Muscle: No Joint Exposed: No Bone Exposed: No Treatment Notes Wound #1 (Left, Lateral Lower Leg) Notes Kathie Rhodes BFD MERRELL, BORSUK ([id number]) Electronic Signature(s) Signed: 03/13/2020 6:06:12 PM By: [name], [name], RN, [name], [name], [name] Entered By: [name], [name], RN, [name], [name] on 03/13/2020 09:01:51 [name] ([id number]) -------------------------------------------------------------------------------- Vitals Details Patient Name: [name] Date of Service: 03/13/2020 9:00 AM Medical Record Number: [id number] Patient Account Number: [id number] Date of Birth/Sex: 05/24/1980 (39 y.o. M) Treating RN: [name] Primary Care Sylis Ketchum: [name] Other Clinician: Referring Jakari Sada: [name] Treating Kiah Keay/Extender: [name] Cliche in Treatment: 170 Vital Signs Time Taken: 08:57 Temperature (F): 98.3 Height (in): 71 Pulse (bpm): 96 Weight (lbs): 338 Respiratory Rate (breaths/min): 18 Body Mass Index (BMI): 47.1 Blood Pressure (mmHg): 122/79 Reference Range: 80 - 120 mg / dl Electronic Signature(s) Signed: 03/13/2020 6:06:12 PM By: [name], [name], RN, [name], [name], [name] Entered By: [name], [name], RN, [name], [name] on 03/13/2020 08:57:33

## 2020-03-31 ENCOUNTER — Other Ambulatory Visit: Payer: Self-pay

## 2020-03-31 ENCOUNTER — Encounter: Payer: 59 | Attending: Physician Assistant | Admitting: Physician Assistant

## 2020-03-31 DIAGNOSIS — G4730 Sleep apnea, unspecified: Secondary | ICD-10-CM | POA: Diagnosis not present

## 2020-03-31 DIAGNOSIS — K5190 Ulcerative colitis, unspecified, without complications: Secondary | ICD-10-CM | POA: Diagnosis not present

## 2020-03-31 DIAGNOSIS — L280 Lichen simplex chronicus: Secondary | ICD-10-CM | POA: Insufficient documentation

## 2020-03-31 DIAGNOSIS — I10 Essential (primary) hypertension: Secondary | ICD-10-CM | POA: Diagnosis not present

## 2020-03-31 DIAGNOSIS — I872 Venous insufficiency (chronic) (peripheral): Secondary | ICD-10-CM | POA: Insufficient documentation

## 2020-03-31 DIAGNOSIS — L97222 Non-pressure chronic ulcer of left calf with fat layer exposed: Secondary | ICD-10-CM | POA: Insufficient documentation

## 2020-03-31 DIAGNOSIS — L03116 Cellulitis of left lower limb: Secondary | ICD-10-CM | POA: Diagnosis not present

## 2020-03-31 DIAGNOSIS — L97321 Non-pressure chronic ulcer of left ankle limited to breakdown of skin: Secondary | ICD-10-CM | POA: Diagnosis not present

## 2020-03-31 DIAGNOSIS — T781XXA Other adverse food reactions, not elsewhere classified, initial encounter: Secondary | ICD-10-CM | POA: Insufficient documentation

## 2020-03-31 DIAGNOSIS — L88 Pyoderma gangrenosum: Secondary | ICD-10-CM | POA: Insufficient documentation

## 2020-03-31 NOTE — Progress Notes (Signed)
DEMAREE, LIBERTO ([id number]) Visit Report for 03/31/2020 Chief Complaint Document Details Patient Name: [name], [name] Date of Service: 03/31/2020 8:00 AM Medical Record Number: [id number] Patient Account Number: [id number] Date of Birth/Sex: 11/20/1980 (39 y.o. M) Treating RN: Army Melia Primary Care Provider: [name] Other Clinician: Referring Provider: [name] Treating Provider/Extender: [name] Cliche in Treatment: [place] from: Patient Chief Complaint He is here in follow up evaluation for LLE pyoderma ulcer Electronic Signature(s) Signed: 03/31/2020 9:04:26 AM By: Worthy Keeler PA-C Entered By: Worthy Keeler on 03/31/2020 09:04:26 [name], [name] ([id number]) -------------------------------------------------------------------------------- HPI Details Patient Name: [name] Date of Service: 03/31/2020 8:00 AM Medical Record Number: [id number] Patient Account Number: [id number] Date of Birth/Sex: November 06, 1980 (39 y.o. M) Treating RN: Army Melia Primary Care Provider: [name] Other Clinician: Referring Provider: [name] Treating Provider/Extender: [name] Cliche in Treatment: 173 History of Present Illness HPI Description: 12/04/16; 39 year old man who comes into the clinic today for review of a wound on the posterior left calf. He tells me that is been there for about a year. He is not a diabetic he does smoke half a pack per day. He was seen in the ER on 11/20/16 felt to have cellulitis around the wound and was given clindamycin. An x-ray did not show osteomyelitis. The patient initially tells me that he has a milk allergy that sets off a pruritic itching rash on his lower legs which she scratches incessantly and he thinks that's what may have set up the wound. He has been using various topical antibiotics and ointments without any effect. He works in a [employer] and is on his feet all day. He does not have a  prior history of wounds however he does have the rash on both lower legs the right arm and the ventral aspect of his left arm. These are excoriations and clearly have had scratching however there are of macular looking areas on both legs including a substantial larger area on the right leg. This does not have an underlying open area. There is no blistering. The patient tells me that 2 years ago in [place] in response to the rash on his legs he saw a dermatologist who told him he had a condition which may be pyoderma gangrenosum although I may be putting words into his mouth. He seemed to recognize this. 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 He is not in any treatment for this. He's had no recent travel 12/11/16; the patient arrives today with his wound and roughly the same condition we've been using silver alginate this is a deep punched out wound with some surrounding erythema but no tenderness. Biopsy I did did not show confirmed pyoderma gangrenosum suggested nonspecific inflammation and vasculitis but does not provide an actual description of what was seen by the pathologist. I'm really not able to understand this We have also received information from the patient's dermatologist in [place] notes from April 2016. This was a doctor Agarwal-antal. 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 He was prescribed topical steroid high potency under occlusion which helped but at this point the patient did not have a deep punched out wound. 12/18/16; the patient's wound is larger in terms of surface area however this surface looks better and there is less depth. The surrounding erythema also is better. The patient states that the wrap we put on came off 2 days ago when he has been using his compression stockings.  He we are in the process of getting a dermatology consult. 12/26/16 on evaluation today patient's left lower extremity wound shows evidence of  infection with surrounding erythema noted. He has been tolerating the dressing changes but states that he has noted more discomfort. There is a larger area of erythema surrounding the wound. No fevers, chills, nausea, or vomiting noted at this time. With that being said the wound still does have slough covering the surface. He is not allergic to any medication that he is aware of at this point. In regard to his right lower extremity he had several regions that are erythematous and pruritic he wonders if there's anything we can do to help that. 01/02/17 I reviewed patient's wound culture which was obtained his visit last week. He was placed on doxycycline at that point. Unfortunately that does not appear to be an antibiotic that would likely help with the situation however the pseudomonas noted on culture is sensitive to Cipro. Also unfortunately patient's wound seems to have a large compared to last week's evaluation. Not severely so but there are definitely increased measurements in general. He is continuing to have discomfort as well he writes this to be a seven out of 10. In fact he would prefer me not to perform any debridement today due to the fact that he is having discomfort and considering he has an active infection on the little reluctant to do so anyway. No fevers, chills, nausea, or vomiting noted at this time. 01/08/17; patient seems dermatology on September 5. I suspect dermatology will want the slides from the biopsy I did sent to their pathologist. I'm not sure if there is a way we can expedite that. In any case the culture I did before I left on vacation 3 weeks ago showed Pseudomonas he was given 10 days of Cipro and per her description of her intake nurses is actually somewhat better this week although the wound is quite a bit bigger than I remember the last time I saw this. He still has 3 more days of Cipro 01/21/17; dermatology appointment tomorrow. He has completed the ciprofloxacin  for Pseudomonas. Surface of the wound looks better however he is had some deterioration in the lesions on his right leg. Meantime the left lateral leg wound we will continue with sample 01/29/17; patient had his dermatology appointment but I can't yet see that note. He is completed his antibiotics. The wound is more superficial but considerably larger in circumferential area than when he came in. This is in his left lateral calf. He also has swollen erythematous areas with superficial wounds on the right leg and small papular areas on both arms. There apparently areas in her his upper thighs and buttocks I did not look at those. Dermatology biopsied the right leg. Hopefully will have their input next week. 02/05/17; patient went back to see his dermatologist who told him that he had a "scratching problem" as well as staph. He is now on a 30 day course of doxycycline and I believe she gave him triamcinolone cream to the right leg areas to help with the itching [not exactly sure but probably triamcinolone]. She apparently looked at the left lateral leg wound although this was not rebiopsied and I think felt to be ultimately part of the same pathogenesis. He is using sample border foam and changing nevus himself. He now has a new open area on the right posterior leg which was his biopsy site I don't have any of the dermatology notes  02/12/17; we put the patient in compression last week with SANTYL to the wound on the left leg and the biopsy. Edema is much better and the depth of the wound is now at level of skin. Area is still the same oBiopsy site on the right lateral leg we've also been using santyl with a border foam dressing and he is changing this himself. 02/19/17; Using silver alginate started last week to both the substantial left leg wound and the biopsy site on the right wound. He is tolerating compression well. Has a an appointment with his primary M.D. tomorrow wondering about diuretics although  I'm wondering if the edema problem is actually lymphedema 02/26/17; the patient has been to see his primary doctor Dr. Jerrel Ivory at [facility] our primary care. She started him on Lasix 20 mg and this seems to have helped with the edema. However we are not making substantial change with the left lateral calf wound and inflammation. The biopsy site on the right leg also looks stable but not really all that different. 03/12/17; the patient has been to see vein and vascular Dr. Lucky Cowboy. He has had venous reflux studies I have not reviewed these. I did get a call from his dermatology office. They felt that he might have pathergy based on their biopsy on his right leg which led them to look at the slides of KOLIN, ERDAHL ([id number]) the biopsy I did on the left leg and they wonder whether this represents pyoderma gangrenosum which was the original supposition in a man with ulcerative colitis albeit inactive for many years. They therefore recommended clobetasol and tetracycline i.e. 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 03/26/17; apparently the patient just had reflux studies not an appointment with Dr. dew. She arrives in clinic today having applied clobetasol for 2-3 weeks. He notes over the last 2-3 days excessive drainage having to change the dressing 3-4 times a day and also expanding erythema. 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 Lillia Carmel at [facility] dermatology locally who kindly saw him at my request. 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 My concern was the degree of erythema medially extending from his left leg wound which was either inflammation from pyoderma  or cellulitis. I put him on Augmentin however culture of the wound showed Pseudomonas which is quinolone sensitive. I really don't believe he has cellulitis however in view of everything I will continue and give him a course of Cipro. He is also on doxycycline as an immune modulator for the pyoderma. In addition to his original wound on the left lateral leg with surrounding erythema he has a wound on the right posterior calf which was an original biopsy site done by dermatology. 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 Saw Dr. Lillia Carmel yesterday. He has been using topical antibiotics to both wound areas his original wound on the left and the biopsies/pathergy area on the right. There is definitely some improvement in the inflammation around the wound on the right although the patient states he has increasing sensitivity of the wounds. He is on prednisone 60 and doxycycline 1 as prescribed by Dr. Lillia Carmel. He is covering the topical antibiotic with gauze  and putting this in his own compression stocks and changing this daily. He states that Dr. Lottie Rater did a culture of the left leg wound yesterday 05/07/17; pyoderma gangrenosum. The patient saw Dr. Lillia Carmel yesterday and has a follow-up with her in one month. He is still using topical antibiotics to both wounds although he can't recall exactly what type. He is still on prednisone 60 mg. Dr. Lillia Carmel stated that the doxycycline could stop if we were in agreement. 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 The right medial leg was induced by biopsy/pathergy. The area on the right is essentially healed. 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 The right medial leg has closed and remains closed. He  is still on prednisone 60. oHe tells me he missed his last dermatology appointment with Dr. Lillia Carmel but will make another appointment. He reports that her blood sugar at a recent screen in [place] was high 200's. He was 180 today. 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 I think he is going to require still much longer prednisone perhaps another 3 months before attempting to taper. In the meantime his wound is a lot better. Smaller. He is cleaning this off daily and applying topical antibiotics. When he was last in the clinic I thought about changing to [facility] and actually put in a couple of calls to dermatology although probably not during their business hours. 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 He continues on oral prednisone. He has been using triple antibiotic ointment. There is surface debris and we will transition to [facility] and have him return in 2 weeks. He has lost 30 pounds since his last appointment with lifestyle modification. He may benefit from topical steroid cream for treatment this can be considered at a later date. 08/22/17 on evaluation today patient appears to actually be doing rather well in regard to his left lateral lower extremity ulcer. He has actually been managed by Dr. Dellia Nims most recently. Patient is currently on oral steroids at this time. This seems to have been of benefit for him. Nonetheless his last visit was actually with Leah on 08/06/17. Currently he is not utilizing any topical steroid creams although this could be of benefit as well. No fevers, chills, nausea, or vomiting noted at this time. 09/05/17 on evaluation today patient appears to be doing better in regard to his left lateral lower extremity ulcer. He has been tolerating the dressing changes without complication. He is using Santyl with good effect. Overall I'm  very pleased with how things are standing at this point. Patient likewise is happy that this is doing better. 09/19/17 on evaluation today patient actually appears to be doing rather well in regard to his left lateral lower extremity ulcer. 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 He's not having any significant pain. 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 He has been tolerating the Santyl without complication and in general I feel like he's making good progress. 10/17/17 on evaluation today patient  appears to be doing very well in regard to his left lateral lower surety ulcer. He has been tolerating the dressing changes without complication. There does not appear to be any evidence of infection he's alternating the Santyl and the triple antibiotic ointment every other day this seems to be doing well for him. 11/03/17 on evaluation today patient appears to be doing very well in regard to his left lateral lower extremity ulcer. He is been tolerating the dressing changes without complication which is good news. Fortunately there does not appear to be any evidence of infection which is also great news. Overall is doing excellent they are starting to taper down on the prednisone is down to 40 mg at this point it also started topical clobetasol for him. 11/17/17 on evaluation today patient appears to be doing well in regard to his left lateral lower surety ulcer. He's been tolerating the dressing changes without complication. He does note that he is having no pain, no excessive drainage or discharge, and overall he feels like things are going about how he would expect and hope they would. Overall he seems to have no evidence of infection at this time in my opinion which is good news. 12/04/17-He is seen in follow-up  evaluation for right lateral lower extremity ulcer. He has been applying topical steroid cream. Today's measurement show slight increase in size. Over the next 2 weeks we will transition to every other day Santyl and steroid cream. He has been encouraged to monitor for changes and notify clinic with any concerns 12/15/17 on evaluation today patient's left lateral motion the ulcer and fortunately is doing worse again at this point. This just since last week to this week has close to doubled in size according to the patient. I did not seeing last week's I do not have a visual to compare this to in our system was also down so we do not have all the charts and at this point. Nonetheless it does have me somewhat concerned in regard to the fact that again he was worried enough about it he has contact the dermatology that placed them back on the full strength, 50 mg a day of the prednisone that he was taken previous. He continues to alternate using clobetasol along with Santyl at this point. He is obviously somewhat frustrated. 12/22/17 on evaluation today patient appears to be doing a little worse compared to last evaluation. Unfortunately the wound is a little deeper and slightly larger than the last week's evaluation. With that being said he has made some progress in regard to the irritation surrounding at this time unfortunately despite that progress that's been made he still has a significant issue going on here. 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 [name], [name] ([id number]) For that reason I discussed with him today the possibility of perform a wound culture to ensure there's nothing overtly infected. 01/06/18 on evaluation today patient's wound is larger and deeper than previously evaluated. With that being said it did appear that his wound was  infected after my last evaluation with him. Subsequently I did end up prescribing a prescription for Bactrim DS which she has been taking and having no complication with. Fortunately there does not appear to be any evidence of infection at this point in time as far as anything spreading, no want to touch, and overall I feel like  things are showing signs of improvement. 01/13/18 on evaluation today patient appears to be even a little larger and deeper than last time. There still muscle exposed in the base of the wound. Nonetheless he does appear to be less erythematous I do believe inflammation is calming down also believe the infection looks like it's probably resolved at this time based on what I'm seeing. No fevers, chills, nausea, or vomiting noted at this time. 01/30/18 on evaluation today patient actually appears to visually look better for the most part. Unfortunately those visually this looks better he does seem to potentially have what may be an abscess in the muscle that has been noted in the central portion of the wound. This is the first time that I have noted what appears to be fluctuance in the central portion of the muscle. With that being said I'm somewhat more concerned about the fact that this might indicate an abscess formation at this location. I do believe that an ultrasound would be appropriate. This is likely something we need to try to do as soon as possible. He has been switch to mupirocin ointment and he is no longer using the steroid ointment as prescribed by dermatology he sees them again next week he's been decreased from 60 to 40 mg of prednisone. 03/09/18 on evaluation today patient actually appears to be doing a little better compared to last time I saw him. There's not as much erythema surrounding the wound itself. He I did review his most recent infectious disease note which was dated 02/24/18. He saw Dr. Michel Bickers in [place]. 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 Patient is now off of antibiotics and they are continually observing this. There seems to be no change in the past two weeks in my pinion based on what the patient says and what I see today compared to what Dr. Megan Salon likely saw two weeks ago. No fevers, chills, nausea, or vomiting noted at this time. 03/23/18 on evaluation today patient's wound actually appears to be showing signs of improvement which is good news. He is currently still on the Dapsone. He is also working on tapering the prednisone to get off of this and Dr. Lottie Rater is working with him in this regard. Nonetheless overall I feel like the wound is doing well it does appear based on the infectious disease note that I reviewed from Dr. 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 I did also review the results of his ultrasound of left lower extremity which revealed there was a dentist tissue in the base of the wound without an abscess noted. 04/06/18 on evaluation today the patient's left lateral lower extremity ulcer actually appears to be doing fairly well which is excellent news. There does not appear to be any evidence of infection at this time which is also great news. Overall he still does have a significantly large ulceration although little by little he seems to be making progress. He is down to 10 mg a day of the prednisone. 04/20/18 on evaluation today patient actually appears to be doing excellent at this time in regard to his left lower extremity ulcer. He's making signs of good progress unfortunately this is taking much longer than we would really like to see but nonetheless he is  making progress. Fortunately there does not appear to be any evidence of infection at this time. No fevers, chills, nausea, or vomiting noted at  this time. The patient has not been using the Santyl due to the cost he hadn't got in this field yet. He's mainly been using the antibiotic ointment topically. Subsequently he also tells me that he really has not been scrubbing in the shower I think this would be helpful again as I told him it doesn't have to be anything too aggressive to even make it believe just enough to keep it free of some of the loose slough and biofilm on the wound surface. 05/11/18 on evaluation today patient's wound appears to be making slow but sure progress in regard to the left lateral lower extremity ulcer. He is been tolerating the dressing changes without complication. Fortunately there does not appear to be any evidence of infection at this time. He is still just using triple antibiotic ointment along with clobetasol occasionally over the area. He never got the Santyl and really does not seem to intend to in my pinion. 06/01/18 on evaluation today patient appears to be doing a little better in regard to his left lateral lower extremity ulcer. He states that overall he does not feel like he is doing as well with the Dapsone as he did with the prednisone. Nonetheless he sees his dermatologist later today and is gonna talk to them about the possibility of going back on the prednisone. Overall again I believe that the wound would be better if you would utilize Santyl but he really does not seem to be interested in going back to the [place] at this point. He has been using triple antibiotic ointment. 06/15/18 on evaluation today patient's wound actually appears to be doing about the same at this point. Fortunately there is no signs of infection at this time. He has made slight improvements although he continues to not really want to clean the wound bed at this point. He states that he just doesn't mess with it he doesn't want to cause any problems with everything else he has going on. He has been on medication, antibiotics  as prescribed by his dermatologist, for a staff infection of his lower extremities which is really drying out now and looking much better he tells me. Fortunately there is no sign of overall infection. 06/29/18 on evaluation today patient appears to be doing well in regard to his left lateral lower surety ulcer all things considering. Fortunately his staff infection seems to be greatly improved compared to previous. He has no signs of infection and this is drying up quite nicely. He is still the doxycycline for this is no longer on cental, Dapsone, or any of the other medications. His dermatologist has recommended possibility of an infusion but right now he does not want to proceed with that. 07/13/18 on evaluation today patient appears to be doing about the same in regard to his left lateral lower surety ulcer. Fortunately there's no signs of infection at this time which is great news. Unfortunately he still builds up a significant amount of Slough/biofilm of the surface of the wound he still is not really cleaning this as he should be appropriately. Again I'm able to easily with saline and gauze remove the majority of this on the surface which if you would do this at home would likely be a dramatic improvement for him as far as getting the area to improve. Nonetheless overall I still feel like he  is making progress is just very slow. I think Santyl will be of benefit for him as well. Still he has not gotten this as of this point. 07/27/18 on evaluation today patient actually appears to be doing little worse in regards of the erythema around the periwound region of the wound he also tells me that he's been having more drainage currently compared to what he was experiencing last time I saw him. He states not quite as bad as what he had because this was infected previously but nonetheless is still appears to be doing poorly. Fortunately there is no evidence of systemic infection at this point. The patient  tells me that he is not going to be able to afford the Santyl. He is still waiting to hear about the infusion therapy with his dermatologist. Apparently she wants an updated colonoscopy first. 08/10/18 on evaluation today patient appears to be doing better in regard to his left lateral lower extremity ulcer. Fortunately he is showing signs of improvement in this regard he's actually been approved for Remicade infusion's as well although this has not been scheduled as of yet. Fortunately there's no signs of active infection at this time in regard to the wound although he is having some issues with infection of the right lower extremity is been seen as dermatologist for this. Fortunately they are definitely still working with him trying to keep things under control. [name], [name] ([id number]) 09/07/18 on evaluation today patient is actually doing rather well in regard to his left lateral lower extremity ulcer. He notes these actually having some hair grow back on his extremity which is something he has not seen in years. He also tells me that the pain is really not giving them any trouble at this time which is also good news overall she is very pleased with the progress he's using a combination of the mupirocin along with the probate is all mixed. 09/21/18 on evaluation today patient actually appears to be doing fairly well all things considered in regard to his looks from the ulcer. He's been tolerating the dressing changes without complication. Fortunately there's no signs of active infection at this time which is good news he is still on all antibiotics or prevention of the staff infection. He has been on prednisone for time although he states it is gonna contact his dermatologist and see if she put them on a short course due to some irritation that he has going on currently. Fortunately there's no evidence of any overall worsening this is going very slow I think cental would be something that would be  helpful for him although he states that $50 for tube is quite expensive. He therefore is not willing to get that at this point. 10/06/18 on evaluation today patient actually appears to be doing decently well in regard to his left lateral leg ulcer. He's been tolerating the dressing changes without complication. Fortunately there's no signs of active infection at this time. Overall I'm actually rather pleased with the progress he's making although it's slow he doesn't show any signs of infection and he does seem to be making some improvement. I do believe that he may need a switch up and dressings to try to help this to heal more appropriately and quickly. 10/19/18 on evaluation today patient actually appears to be doing better in regard to his left lateral lower extremity ulcer. This is shown signs of having much less Slough buildup at this point due to the fact he has been using  the Santyl. Obviously this is very good news. The overall size of the wound is not dramatically smaller but again the appearance is. 11/02/18 on evaluation today patient actually appears to be doing quite well in regard to his lower [place] ulcer. A lot of the skin around the ulcer is actually somewhat irritating at this point this seems to be more due to the dressing causing irritation from the adhesive that anything else. Fortunately there is no signs of active infection at this time. 11/24/18 on evaluation today patient appears to be doing a little worse in regard to his overall appearance of his lower extremity ulcer. There's more erythema and warmth around the wound unfortunately. He is currently on doxycycline which he has been on for some time. With that being said I'm not sure that seems to be helping with what appears to possibly be an acute cellulitis with regard to his left lower extremity ulcer. No fevers, chills, nausea, or vomiting noted at this time. 12/08/18 on evaluation today patient's wounds actually appears to  be doing significantly better compared to his last evaluation. He has been using Santyl along with alternating tripling about appointment as well as the steroid cream seems to be doing quite well and the wound is showing signs of improvement which is excellent news. Fortunately there's no evidence of infection and in fact his culture came back negative with only normal skin flora noted. 12/21/2018 upon evaluation today patient actually appears to be doing excellent with regard to his ulcer. This is actually the best that I have seen it since have been helping to take care of him. It is both smaller as well as less slough noted on the surface of the wound and seems to be showing signs of good improvement with new skin growing from the edges. He has been using just the triamcinolone he does wonder if he can get a refill of that ointment today. 01/04/2019 upon evaluation today patient actually appears to be doing well with regard to his left lateral lower extremity ulcer. With that being said it does not appear to be that he is doing quite as well as last time as far as progression is concerned. There does not appear to be any signs of infection or significant irritation which is good news. With that being said I do believe that he may benefit from switching to a collagen based dressing based on how clean The wound appears. 01/18/2019 on evaluation today patient actually appears to be doing well with regard to his wound on the left lower extremity. He is not made a lot of progress compared to where we were previous but nonetheless does seem to be doing okay at this time which is good news. There is no signs of active infection which is also good news. My only concern currently is I do wish we can get him into utilizing the collagen dressing his insurance would not pay for the supplies that we ordered although it appears that he may be able to order this through his supply company that he typically utilizes.  This is [employer]. Nonetheless he did try to order it during the office visit today and it appears this did go through. We will see if he can get that it is a different brand but nonetheless he has collagen and I do think will be beneficial. 02/01/2019 on evaluation today patient actually appears to be doing a little worse today in regard to the overall size of his wounds. Fortunately there  is no signs of active infection at this time. That is visually. Nonetheless when this is happened before it was due to infection. For that reason were somewhat concerned about that this time as well. 02/08/2019 on evaluation today patient unfortunately appears to be doing slightly worse with regard to his wound upon evaluation today. Is measuring a little deeper and a little larger unfortunately. I am not really sure exactly what is causing this to enlarge he actually did see his dermatologist she is going to see about initiating Humira for him. Subsequently she also did do steroid injections into the wound itself in the periphery. Nonetheless still nonetheless he seems to be getting a little bit larger he is gone back to just using the steroid cream topically which I think is appropriate. I would say hold off on the collagen for the time being is definitely a good thing to do. Based on the culture results which we finally did get the final result back regarding it shows staph as the bacteria noted again that can be a normal skin bacteria based on the fact however he is having increased drainage and worsening of the wound measurement wise I would go ahead and place him on an antibiotic today I do believe for this. 02/15/2019 on evaluation today patient actually appears to be doing somewhat better in regard to his ulcer. There is no signs of worsening at this time I did review his culture results which showed evidence of Staphylococcus aureus but not MRSA. Again this could just be more related to the normal skin  bacteria although he states the drainage has slowed down quite a bit he may have had a mild infection not just colonization. And was much smaller and then since around10/04/2019 on evaluation today patient appears to be doing unfortunately worse as far as the size of the wound. I really feel like that this is steadily getting larger again it had been doing excellent right at the beginning of September we have seen a steady increase in the area of the wound it is almost 2-1/2 times the size it was on September 1. Obviously this is a bad trend this is not wanting to see. For that reason we went back to using just the topical triamcinolone cream which does seem to help with inflammation. I checked him for bacteria by way of culture and nothing showed positive there. I am considering giving him a short course of a tapering steroid Dosepak today to see if that is can be beneficial for him. The patient is in agreement with giving that a try. 03/08/2019 on evaluation today patient appears to be doing very well in comparison to last evaluation with regard to his lower extremity ulcer. This is showing signs of less inflammation and actually measuring slightly smaller compared to last time every other week over the past month and a half he has been measuring larger larger larger. Nonetheless I do believe that the issue has been inflammation the prednisone does seem to [facility] [place] ([id number]) have been beneficial for him which is good news. No fevers, chills, nausea, vomiting, or diarrhea. 03/22/2019 on evaluation today patient appears to be doing about the same with regard to his leg ulcer. He has been tolerating the dressing changes without complication. With that being said the wound seems to be mostly arrested at its current size but really is not making any progress except for when we prescribed the prednisone. He did show some signs of dropping as far as  the overall size of the wound during that interval  week. Nonetheless this is something he is not on long-term at this point and unfortunately I think he is getting need either this or else the Humira which his dermatologist has discussed try to get approval for. With that being said he will be seeing his dermatologist on the 11th of this month that is November. 04/19/2019 on evaluation today patient appears to be doing really about the same the wound is measuring slightly larger compared to last time I saw him. He has not been into the office since November 2 due to the fact that he unfortunately had Covid as that his entire family. He tells me that it was rough but they did pull-through and he seems to be doing much better. Fortunately there is no signs of active infection at this time. No fevers, chills, nausea, vomiting, or diarrhea. 05/10/2019 on evaluation today patient unfortunately appears to be doing significantly worse as compared to last time I saw him. He does tell me that he has had his first dose of Humira and actually is scheduled to get the next one in the upcoming week. With that being said he tells me also that in the past several days he has been having a lot of issues with green drainage she showed me a picture this is more blue-green in color. He is also been having issues with increased sloughy buildup and the wound does appear to be larger today. Obviously this is not the direction that we want everything to take based on the starting of his Humira. Nonetheless I think this is definitely a result of likely infection and to be honest I think this is probably Pseudomonas causing the infection based on what I am seeing. 05/24/2019 on evaluation today patient unfortunately appears to be doing significantly worse compared to his prior evaluation with me 2 weeks ago. I did review his culture results which showed that he does have Staph aureus as well as Pseudomonas noted on the culture. Nonetheless the Levaquin that I prescribed for him  does not appear to have been appropriate and in fact he tells me he is no longer experiencing the green drainage and discharge that he had at the last visit. Fortunately there is no signs of active infection at this time which is good news although the wound has significantly worsened it in fact is much deeper than it was previous. We have been utilizing up to this point triamcinolone ointment as the prescription topical of choice but at this time I really feel like that the wound is getting need to be packed in order to appropriately manage this due to the deeper nature of the wound. Therefore something along the lines of an alginate dressing may be more appropriate. 05/31/2019 upon inspection today patient's wound actually showed signs of doing poorly at this point. Unfortunately he just does not seem to be making any good progress despite what we have tried. He actually did go ahead and pick up the Cipro and start taking that as he was noticing more green drainage he had previously completed the Levaquin that I prescribed for him as well. Nonetheless he missed his appointment for the seventh last week on Wednesday with the wound care center and [facility] where his dermatologist referred him. Obviously I do think a second opinion would be helpful at this point especially in light of the fact that the patient seems to be doing so poorly despite the fact  that we have tried everything that I really know how at this point. The only thing that ever seems to have helped him in the past is when he was on high doses of continual steroids that did seem to make a difference for him. 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 06/07/2019 upon evaluation today patient unfortunately appears to be doing worse yet again with regard to his wound. In fact I  am starting to question whether or not he may have a fluid pocket in the muscle at this point based on the bulging and the soft appearance to the central portion of the muscle area. There is not anything draining from the muscle itself at this time which is good news but nonetheless the wound is expanding. I am not really seeing any results of the Humira as far as overall wound progression based on what I am seeing at this point. The patient has been referred for second opinion with regard to his wound to the [facility] wound care center by his dermatologist which I definitely am not in opposition to. Unfortunately we tried multiple dressings in the past including collagen, alginate, and at one point even Hydrofera Blue. With that being said he is never really used it for any significant amount of time due to the fact that he often complains of pain associated with these dressings and then will go back to either using the Santyl which she has done intermittently or more frequently the triamcinolone. He is also using his own compression stockings. We have wrapped him in the past but again that was something else that he really was not a big fan of. 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 He seemed to do much better when he was on the steroids although honestly I understand that the steroids are not necessarily the best medication to be on long-term obviously 06/14/2019 on evaluation today patient appears to be doing actually a little bit better with regard to the overall appearance with his leg. Unfortunately he does continue to have issues with what appears to be some fluid underneath  the muscle although he did see the wound specialty center at [facility] last week their main goals were to see about infusion therapy in place of the Humira as they feel like that is not quite strong enough. They also recommended that we continue with the treatment otherwise as we are they felt like that was appropriate and they are okay with him continuing to follow-up here with [place] in that regard. With that being said they are also sending him to the vein specialist there to see about vein stripping and if that would be of benefit for him. Subsequently they also did not really address whether or not an ultrasound of the muscle area to see if there is anything that needs to be addressed here would be appropriate or not. For that reason I discussed this with him last week I think we  may proceed down that road at this point. 06/21/2019 upon evaluation today patient's wound actually appears to be doing slightly better compared to previous evaluations. I do believe that he has made a difference with regard to the progression here with the use of oral steroids. Again in the past has been the only thing that is really calm things down. He does tell me that from [facility] is gotten a good news from there that there are no further vein stripping that is necessary at this point. I do not have that available for review today although the patient did relay this to me. He also did obtain and have the ultrasound of the wound completed which I did sign off on today. It does appear that there is no fluid collection under the muscle this is likely then just edematous tissue in general. That is also good news. Overall I still believe the inflammation is the main issue here. He did inquire about the possibility of a wound VAC again with the muscle protruding like it is I am not really sure whether the wound VAC is necessarily ideal or not. That is something we will have to consider although I do believe he may need compression  wrapping to try to help with edema control which could potentially be of benefit. 06/28/2019 on evaluation today patient appears to be doing slightly better measurement wise although this is not terribly smaller he least seems to be trending towards that direction. With that being said he still seems to have purulent drainage noted in the wound bed at this time. He has been on Levaquin followed by Cipro over the past month. Unfortunately he still seems to have some issues with active infection at this time. I did perform a culture last week in order to evaluate and see if indeed there was still anything going on. Subsequently the culture did come back showing Pseudomonas which is consistent with the drainage has been having which is blue-green in color. He also has had an odor that again was somewhat consistent with Pseudomonas as well. Long story short it appears that the culture showed an intermediate finding with regard to how well the Cipro will work for the Pseudomonas infection. Subsequently being that he does not seem to be clearing up and at best what we are doing is just keeping this at [facility] I think he may need to see infectious disease to discuss IV antibiotic options. [name], [name] ([id number]) 07/05/2019 upon evaluation today patient appears to be doing okay in regard to his leg ulcer. He has been tolerating the dressing changes at this point without complication. Fortunately there is no signs of active infection at this time which is good news. No fevers, chills, nausea, vomiting, or diarrhea. With that being said he does have an appointment with infectious disease tomorrow and his primary care on Wednesday. Again the reason for the infectious disease referral was due to the fact that he did not seem to be fully resolving with the use of oral antibiotics and therefore we were thinking that IV antibiotic therapy may be necessary secondary to the fact that there was an intermediate finding for  how effective the Cipro may be. Nonetheless again he has been having a lot of purulent and even green drainage. Fortunately right now that seems to have calmed down over the past week with the reinitiation of the oral antibiotic. Nonetheless we will see what Dr. Megan Salon has to say. 07/12/2019 upon evaluation today patient appears to be  doing about the same at this point in regard to his left lower extremity ulcer. Fortunately there is no signs of active infection at this time which is good news I do believe the Levaquin has been beneficial I did review Dr. Hale Bogus note and to be honest I agree that the patient's leg does appear to be doing better currently. What we found in the past as he does not seem to really completely resolve he will stop the antibiotic and then subsequently things will revert back to having issues with blue-green drainage, increased pain, and overall worsening in general. Obviously that is the reason I sent him back to infectious disease. 07/19/2019 upon evaluation today patient appears to be doing roughly the same in size there is really no dramatic improvement. He has started back on the Levaquin at this point and though he seems to be doing okay he did still have a lot of blue/green drainage noted on evaluation today unfortunately. I think that this is still indicative more likely of a Pseudomonas infection as previously noted and again he does see Dr. Megan Salon in just a couple of days. I do not know that were really able to effectively clear this with just oral antibiotics alone based on what I am seeing currently. Nonetheless we are still continue to try to manage as best we can with regard to the patient and his wound. I do think the wrap was helpful in decreasing the edema which is excellent news. No fevers, chills, nausea, vomiting, or diarrhea. 07/26/2019 upon evaluation today patient appears to be doing slightly better with regard to the overall appearance of the muscle  there is no dark discoloration centrally. Fortunately there is no signs of active infection at this time. No fevers, chills, nausea, vomiting, or diarrhea. Patient's wound bed currently the patient did have an appointment with Dr. Megan Salon at infectious disease last week. With that being said Dr. Megan Salon the patient states was still somewhat hesitant about put him on any IV antibiotics he wanted [place] to repeat cultures today and then see where things go going forward. He does look like Dr. Megan Salon because of some improvement the patient did have with the Levaquin wanted [place] to see about repeating cultures. If it indeed grows the Pseudomonas again then he recommended a possibility of considering a PICC line placement and IV antibiotic therapy. He plans to see the patient back in 1 to 2 weeks. 08/02/2019 upon evaluation today patient appears to be doing poorly with regard to his left lower extremity. We did get the results of his culture back it shows that he is still showing evidence of Pseudomonas which is consistent with the purulent/blue-green drainage that he has currently. Subsequently the culture also shows that he now is showing resistance to the oral fluoroquinolones which is unfortunate as that was really the only thing to treat the infection prior. I do believe that he is looking like this is going require IV antibiotic therapy to get this under control. Fortunately there is no signs of systemic infection at this time which is good news. The patient does see Dr. Megan Salon tomorrow. 08/09/2019 upon evaluation today patient appears to be doing better with regard to his left lower extremity ulcer in regard to the overall appearance. He is currently on IV antibiotic therapy. As ordered by Dr. Megan Salon. Currently the patient is on ceftazidime which she is going to take for the next 2 weeks and then follow-up for 4 to 5-week appointment with Dr. Megan Salon.  The patient started this this past Friday  symptoms have not for a total of 3 days currently in full. 08/16/2019 upon evaluation today patient's wound actually does show muscle in the base of the wound but in general does appear to be much better as far as the overall evidence of infection is concerned. In fact I feel like this is for the most part cleared up he still on the IV antibiotics he has not completed the full course yet but I think he is doing much better which is excellent news. 08/23/2019 upon evaluation today patient appears to be doing about the same with regard to his wound at this point. He tells me that he still has pain unfortunately. Fortunately there is no evidence of systemic infection at this time which is great news. There is significant muscle protrusion. 09/13/19 upon evaluation today patient appears to be doing about the same in regard to his leg unfortunately. He still has a lot of drainage coming from the ulceration there is still muscle exposed. With that being said the patient's last wound culture still showed an intermediate finding with regard to the Pseudomonas he still having the bluish/green drainage as well. Overall I do not know that the wound has completely cleared of infection at this point. Fortunately there is no signs of active infection systemically at this point which is good news. 09/20/2019 upon evaluation today patient's wound actually appears to be doing about the same based on what I am seeing currently. I do not see any signs of systemic infection he still does have evidence of some local infection and drainage. He did see Dr. Megan Salon last week and Dr. Megan Salon states that he probably does need a different IV antibiotic although he does not want to put him on this until the patient begins the Remicade infusion which is actually scheduled for about 10 days out from today on 13 May. Following that time Dr. Megan Salon is good to see him back and then will evaluate the feasibility of starting him on the  IV antibiotic therapy once again at that point. I do not disagree with this plan I do believe as Dr. Megan Salon stated in his note that I reviewed today that the patient's issue is multifactorial with the pyoderma being 1 aspect of this that were hoping the Remicade will be helpful for her. In the meantime I think the gentamicin is, helping to keep things under decent okay control in regard to the ulcer. 09/27/2019 upon evaluation today patient appears to be doing about the same with regard to his wound still there is a lot of muscle exposure though he does have some hyper granulation tissue noted around the edge and actually some granulation tissue starting to form over the muscle which is actually good news. Fortunately there is no evidence of active infection which is also good news. His pain is less at this point. 5/21; this is a patient I have not seen in a long time. 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 He has a large wound on the left lateral leg with protruding muscle. He comes in the clinic today showing the same area on his left medial ankle. He says there is been a spot there for some time although we have not previously defined this. Today he has a clearly defined area with slight amount of skin breakdown surrounded by raised areas with a purplish hue in color. This is not painful he says it is irritated.  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 He still has muscle protruding from the lateral portion of his left leg but fortunately the new area that they were concerned about at his last visit does not appear to have opened at this point. He is currently on Remicade infusions and seems to be doing better in my opinion in fact the wound itself seems to be overall much better. The purplish discoloration that he did have seems to have resolved  and I think that is a good sign that hopefully the Remicade is doing its job. He does have some biofilm noted over the surface of the wound. 11/01/2019 on evaluation today patient's wound actually appears to be doing excellent at this time. Fortunately there is no evidence of active infection and overall I feel like he is making great progress. The Remicade seems to be due excellent job in my opinion. [name], [name] ([id number]) 11/08/19 evaluation today vision actually appears to be doing quite well with regard to his weight ulcer. He's been tolerating dressing changes without complication. Fortunately there is no evidence of infection. No fevers, chills, nausea, or vomiting noted at this time. Overall states that is having more itching than pain which is actually a good sign in my opinion. 12/13/2019 upon evaluation today patient appears to be doing well today with regard to his wound. He has been tolerating the dressing changes without complication. Fortunately there is no sign of active infection at this time. No fevers, chills, nausea, vomiting, or diarrhea. Overall I feel like the infusion therapy has been very beneficial for him. 01/06/2020 on evaluation today patient appears to be doing well with regard to his wound. This is measuring smaller and actually looks to be doing better. Fortunately there is no signs of active infection at this point. No fevers, chills, nausea, vomiting, or diarrhea. With that being said he does still have the blue-green drainage but this does not seem to be causing any significant issues currently. He has been using the gentamicin that does seem to be keeping things under decent control at this point. He goes later this morning for his next infusion therapy for the pyoderma which seems to also be very beneficial. 02/07/2020 on evaluation today patient appears to be doing about the same in regard to his wounds currently. Fortunately there is no signs of active infection  systemically he does still have evidence of local infection still using gentamicin. He also is showing some signs of improvement albeit slowly I do feel like we are making some progress here. 02/21/2020 upon evaluation today patient appears to be making some signs of improvement the wound is measuring a little bit smaller which is great news and overall I am very pleased with where he stands currently. He is going to be having infusion therapy treatment on the 15th of this month. Fortunately there is no signs of active infection at this time. 03/13/2020 I do believe patient's wound is actually showing some signs of improvement here which is great news. He has continue with the infusion therapy through rheumatology/dermatology at [facility]. That does seem to be beneficial. I still think he gets as much benefit from this as he did from the prednisone initially but nonetheless obviously this is less harsh on his body that the prednisone as far as they are concerned. 03/31/2020 on evaluation today patient's wound actually showing signs of some pretty good improvement in regard  to the overall appearance of the wound bed. There is still muscle exposed though he does have some epithelial growth around the edges of the wound. Fortunately there is no signs of active infection at this time. No fevers, chills, nausea, vomiting, or diarrhea. Electronic Signature(s) Signed: 03/31/2020 12:32:43 PM By: Worthy Keeler PA-C Entered By: Worthy Keeler on 03/31/2020 12:32:43 [name], [name] ([id number]) -------------------------------------------------------------------------------- Physical Exam Details Patient Name: [name] Date of Service: 03/31/2020 8:00 AM Medical Record Number: [id number] Patient Account Number: [id number] Date of Birth/Sex: 11/12/1980 (39 y.o. M) Treating RN: Army Melia Primary Care Provider: [name] Other Clinician: Referring Provider: [name] Treating Provider/Extender:  [name] Cliche in Treatment: 173 Constitutional Obese and well-hydrated in no acute distress. Respiratory normal breathing without difficulty. Psychiatric this patient is able to make decisions and demonstrates good insight into disease process. Alert and Oriented x 3. pleasant and cooperative. Notes Patient's wound bed currently showed signs of excellent granulation and some good epithelization around the edges of the wound he does still have muscle exposed in the base of the wound but again I feel like he is making progress. There is a lot of skin irritation from the dressings around the edges unfortunately he just cannot wrap this he has to have something adhesive but the adhesive does not sit well with his skin. Electronic Signature(s) Signed: 03/31/2020 12:33:02 PM By: Worthy Keeler PA-C Entered By: Worthy Keeler on 03/31/2020 12:33:02 [name] ([id number]) -------------------------------------------------------------------------------- Physician Orders Details Patient Name: [name] Date of Service: 03/31/2020 8:00 AM Medical Record Number: [id number] Patient Account Number: [id number] Date of Birth/Sex: [birth year] (39 y.o. M) Treating RN: Army Melia Primary Care Provider: [name] Other Clinician: Referring Provider: [name] Treating Provider/Extender: [name] Cliche in Treatment: [phone] Verbal / Phone Orders: No Diagnosis Coding Wound Cleansing Wound #1 Left,Lateral Lower Leg o Clean wound with Normal Saline. Anesthetic (add to Medication List) Wound #1 Left,Lateral Lower Leg o Topical Lidocaine 4% cream applied to wound bed prior to debridement (In Clinic Only). Primary Wound Dressing o Gentamicin Sulfate Cream Wound #1 Left,Lateral Lower Leg o Collagen Secondary Dressing Wound #1 Left,Lateral Lower Leg o Boardered Foam Dressing Dressing Change Frequency Wound #1 Left,Lateral Lower Leg o Change dressing every  day. Follow-up Appointments Wound #1 Left,Lateral Lower Leg o Return Appointment in 3 weeks. Edema Control Wound #1 Left,Lateral Lower Leg o Patient to wear own compression stockings o Elevate legs to the level of the heart and pump ankles as often as possible Electronic Signature(s) Signed: 03/31/2020 9:05:22 AM By: Army Melia Signed: 03/31/2020 2:13:48 PM By: Worthy Keeler PA-C Entered By: Army Melia on 03/31/2020 08:23:15 [name] ([id number]) -------------------------------------------------------------------------------- Problem List Details Patient Name: [name] Date of Service: 03/31/2020 8:00 AM Medical Record Number: [id number] Patient Account Number: [id number] Date of Birth/Sex: [birth year] (39 y.o. M) Treating RN: Army Melia Primary Care Provider: [name] Other Clinician: Referring Provider: [name] Treating Provider/Extender: [name] Cliche in Treatment: [phone] Active Problems ICD-10 Encounter Code Description Active Date MDM Diagnosis L97.222 Non-pressure chronic ulcer of left calf with fat layer exposed 12/04/2016 No Yes L88 Pyoderma gangrenosum 03/26/2017 No Yes L97.321 Non-pressure chronic ulcer of left ankle limited to breakdown of skin 10/08/2019 No Yes I87.2 Venous insufficiency (chronic) (peripheral) 12/04/2016 No Yes L03.116 Cellulitis of left lower limb 05/24/2019 No Yes Inactive Problems ICD-10 Code Description Active Date Inactive Date L97.213 Non-pressure chronic ulcer of right calf with necrosis of muscle 04/02/2017 04/02/2017 Resolved Problems Electronic Signature(s)  Signed: 03/31/2020 9:03:18 AM By: Worthy Keeler PA-C Entered By: Worthy Keeler on 03/31/2020 09:03:18 [name] ([id number]) -------------------------------------------------------------------------------- Progress Note Details Patient Name: [name] Date of Service: 03/31/2020 8:00 AM Medical Record Number: [id number] Patient Account  Number: [id number] Date of Birth/Sex: 1980-10-16 (39 y.o. M) Treating RN: Army Melia Primary Care Provider: [name] Other Clinician: Referring Provider: [name] Treating Provider/Extender: [name] Cliche in Treatment: [phone] Subjective Chief Complaint Information obtained from Patient He is here in follow up evaluation for LLE pyoderma ulcer History of Present Illness (HPI) 12/04/16; 39 year old man who comes into the clinic today for review of a wound on the posterior left calf. He tells me that is been there for about a year. He is not a diabetic he does smoke half a pack per day. He was seen in the ER on 11/20/16 felt to have cellulitis around the wound and was given clindamycin. An x-ray did not show osteomyelitis. The patient initially tells me that he has a milk allergy that sets off a pruritic itching rash on his lower legs which she scratches incessantly and he thinks that's what may have set up the wound. He has been using various topical antibiotics and ointments without any effect. He works in a [employer] and is on his feet all day. He does not have a prior history of wounds however he does have the rash on both lower legs the right arm and the ventral aspect of his left arm. These are excoriations and clearly have had scratching however there are of macular looking areas on both legs including a substantial larger area on the right leg. This does not have an underlying open area. There is no blistering. The patient tells me that 2 years ago in [place] in response to the rash on his legs he saw a dermatologist who told him he had a condition which may be pyoderma gangrenosum although I may be putting words into his mouth. He seemed to recognize this. 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 He is not in any treatment for this. He's had no recent travel 12/11/16; the patient arrives today with his wound and roughly the same  condition we've been using silver alginate this is a deep punched out wound with some surrounding erythema but no tenderness. Biopsy I did did not show confirmed pyoderma gangrenosum suggested nonspecific inflammation and vasculitis but does not provide an actual description of what was seen by the pathologist. I'm really not able to understand this We have also received information from the patient's dermatologist in [place] notes from April 2016. This was a doctor Agarwal-antal. 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 He was prescribed topical steroid high potency under occlusion which helped but at this point the patient did not have a deep punched out wound. 12/18/16; the patient's wound is larger in terms of surface area however this surface looks better and there is less depth. The surrounding erythema also is better. The patient states that the wrap we put on came off 2 days ago when he has been using his compression stockings. He we are in the process of getting a dermatology consult. 12/26/16 on evaluation today patient's left lower extremity wound shows evidence of infection with surrounding erythema noted. He has been tolerating the dressing changes but states that he has noted more discomfort. There is a larger area of erythema surrounding the wound. No fevers, chills, nausea, or vomiting  noted at this time. With that being said the wound still does have slough covering the surface. He is not allergic to any medication that he is aware of at this point. In regard to his right lower extremity he had several regions that are erythematous and pruritic he wonders if there's anything we can do to help that. 01/02/17 I reviewed patient's wound culture which was obtained his visit last week. He was placed on doxycycline at that point. Unfortunately that does not appear to be an antibiotic that would likely help with the situation however the pseudomonas noted on culture is sensitive to  Cipro. Also unfortunately patient's wound seems to have a large compared to last week's evaluation. Not severely so but there are definitely increased measurements in general. He is continuing to have discomfort as well he writes this to be a seven out of 10. In fact he would prefer me not to perform any debridement today due to the fact that he is having discomfort and considering he has an active infection on the little reluctant to do so anyway. No fevers, chills, nausea, or vomiting noted at this time. 01/08/17; patient seems dermatology on September 5. I suspect dermatology will want the slides from the biopsy I did sent to their pathologist. I'm not sure if there is a way we can expedite that. In any case the culture I did before I left on vacation 3 weeks ago showed Pseudomonas he was given 10 days of Cipro and per her description of her intake nurses is actually somewhat better this week although the wound is quite a bit bigger than I remember the last time I saw this. He still has 3 more days of Cipro 01/21/17; dermatology appointment tomorrow. He has completed the ciprofloxacin for Pseudomonas. Surface of the wound looks better however he is had some deterioration in the lesions on his right leg. Meantime the left lateral leg wound we will continue with sample 01/29/17; patient had his dermatology appointment but I can't yet see that note. He is completed his antibiotics. The wound is more superficial but considerably larger in circumferential area than when he came in. This is in his left lateral calf. He also has swollen erythematous areas with superficial wounds on the right leg and small papular areas on both arms. There apparently areas in her his upper thighs and buttocks I did not look at those. Dermatology biopsied the right leg. Hopefully will have their input next week. 02/05/17; patient went back to see his dermatologist who told him that he had a "scratching problem" as well as staph.  He is now on a 30 day course of doxycycline and I believe she gave him triamcinolone cream to the right leg areas to help with the itching [not exactly sure but probably triamcinolone]. She apparently looked at the left lateral leg wound although this was not rebiopsied and I think felt to be ultimately part of the same pathogenesis. He is using sample border foam and changing nevus himself. He now has a new open area on the right posterior leg which was his biopsy site I don't have any of the dermatology notes 02/12/17; we put the patient in compression last week with SANTYL to the wound on the left leg and the biopsy. Edema is much better and the depth of the wound is now at level of skin. Area is still the same Biopsy site on the right lateral leg we've also been using santyl with a border foam dressing  and he is changing this himself. 02/19/17; Using silver alginate started last week to both the substantial left leg wound and the biopsy site on the right wound. He is tolerating compression well. Has a an appointment with his primary M.D. tomorrow wondering about diuretics although I'm wondering if the edema problem is actually lymphedema [name], [name] ([id number]) 02/26/17; the patient has been to see his primary doctor Dr. Jerrel Ivory at [place] our primary care. She started him on Lasix 20 mg and this seems to have helped with the edema. However we are not making substantial change with the left lateral calf wound and inflammation. The biopsy site on the right leg also looks stable but not really all that different. 03/12/17; the patient has been to see vein and vascular Dr. Lucky Cowboy. He has had venous reflux studies I have not reviewed these. I did get a call from his dermatology office. They felt that he might have pathergy based on their biopsy on his right leg which led them to look at the slides of the biopsy I did on the left leg and they wonder whether this represents pyoderma gangrenosum  which was the original supposition in a man with ulcerative colitis albeit inactive for many years. They therefore recommended clobetasol and tetracycline i.e. 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 03/26/17; apparently the patient just had reflux studies not an appointment with Dr. dew. She arrives in clinic today having applied clobetasol for 2-3 weeks. He notes over the last 2-3 days excessive drainage having to change the dressing 3-4 times a day and also expanding erythema. 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 Lillia Carmel at [facility] dermatology locally who kindly saw him at my request. 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 My concern was the degree of erythema medially extending from his left leg wound which was either inflammation from pyoderma or cellulitis. I put him on Augmentin however culture of the wound showed Pseudomonas which is quinolone sensitive. I really don't believe he has cellulitis however in view of everything I will continue and give him a course of Cipro. He is also on doxycycline as an immune modulator for the pyoderma. In addition to his original wound on the left lateral leg with surrounding erythema he has a wound on the right posterior calf which was an original biopsy site done by dermatology. 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 Saw Dr. Lillia Carmel yesterday. He has been using topical antibiotics to both wound areas his original wound on the left and the biopsies/pathergy area on the right. There is definitely some improvement in the inflammation around the wound on the  right although the patient states he has increasing sensitivity of the wounds. He is on prednisone 60 and doxycycline 1 as prescribed by Dr. Lillia Carmel. He is covering the topical antibiotic with gauze and putting this in his own compression stocks and changing this daily. He states that Dr. Lottie Rater did a culture of the left leg wound yesterday 05/07/17; pyoderma gangrenosum. The patient saw Dr. Lillia Carmel yesterday and has a follow-up with her in one month. He is still using topical antibiotics to both wounds although he can't recall exactly what  type. He is still on prednisone 60 mg. Dr. Lillia Carmel stated that the doxycycline could stop if we were in agreement. 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 The right medial leg was induced by biopsy/pathergy. The area on the right is essentially healed. 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 The right medial leg has closed and remains closed. He is still on prednisone 60. He tells me he missed his last dermatology appointment with Dr. Lillia Carmel but will make another appointment. He reports that her blood sugar at a recent screen in [place] was high 200's. He was 180 today. 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 I think he is going to require still much longer prednisone perhaps another 3 months before attempting to taper. In the meantime his wound is a lot better. Smaller. He is cleaning this off daily and applying topical antibiotics. When he was last in the clinic I thought about changing to [facility] and actually put in a couple of calls to dermatology although probably not during their business hours. In any case the wound looks better smaller I don't think there is any need to change what he is doing 08/06/17-he is here in follow up  evaluation for pyoderma left leg ulcer. He continues on oral prednisone. He has been using triple antibiotic ointment. There is surface debris and we will transition to [facility] and have him return in 2 weeks. He has lost 30 pounds since his last appointment with lifestyle modification. He may benefit from topical steroid cream for treatment this can be considered at a later date. 08/22/17 on evaluation today patient appears to actually be doing rather well in regard to his left lateral lower extremity ulcer. He has actually been managed by Dr. Dellia Nims most recently. Patient is currently on oral steroids at this time. This seems to have been of benefit for him. Nonetheless his last visit was actually with Leah on 08/06/17. Currently he is not utilizing any topical steroid creams although this could be of benefit as well. No fevers, chills, nausea, or vomiting noted at this time. 09/05/17 on evaluation today patient appears to be doing better in regard to his left lateral lower extremity ulcer. He has been tolerating the dressing changes without complication. He is using Santyl with good effect. Overall I'm very pleased with how things are standing at this point. Patient likewise is happy that this is doing better. 09/19/17 on evaluation today patient actually appears to be doing rather well in regard to his left lateral lower extremity ulcer. 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 He's not having any significant pain. 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 He has been tolerating the Santyl without complication and in general I feel like he's making good progress. 10/17/17 on evaluation today patient appears to be doing very well in regard to his left lateral lower surety ulcer. He has been tolerating the dressing  changes without complication. There does not appear to be any evidence of infection he's alternating the Santyl and the triple antibiotic ointment every other day this seems to be doing well for him. 11/03/17 on evaluation today patient  appears to be doing very well in regard to his left lateral lower extremity ulcer. He is been tolerating the dressing changes without complication which is good news. Fortunately there does not appear to be any evidence of infection which is also great news. Overall is doing excellent they are starting to taper down on the prednisone is down to 40 mg at this point it also started topical clobetasol for him. 11/17/17 on evaluation today patient appears to be doing well in regard to his left lateral lower surety ulcer. He's been tolerating the dressing changes without complication. He does note that he is having no pain, no excessive drainage or discharge, and overall he feels like things are going about how he would expect and hope they would. Overall he seems to have no evidence of infection at this time in my opinion which is good news. 12/04/17-He is seen in follow-up evaluation for right lateral lower extremity ulcer. He has been applying topical steroid cream. Today's measurement show slight increase in size. Over the next 2 weeks we will transition to every other day Santyl and steroid cream. He has been encouraged to monitor for changes and notify clinic with any concerns 12/15/17 on evaluation today patient's left lateral motion the ulcer and fortunately is doing worse again at this point. This just since last week to this week has close to doubled in size according to the patient. I did not seeing last week's I do not have a visual to compare this to in our system was also down so we do not have all the charts and at this point. Nonetheless it does have me somewhat concerned in regard to the fact that again he was worried enough about it he has contact the  dermatology that placed them back on the full strength, 50 mg a day of the prednisone that he was taken previous. He continues to alternate using clobetasol along with Santyl at this point. He is obviously somewhat frustrated. JAHSIAH, CARPENTER ([id number]) 12/22/17 on evaluation today patient appears to be doing a little worse compared to last evaluation. Unfortunately the wound is a little deeper and slightly larger than the last week's evaluation. With that being said he has made some progress in regard to the irritation surrounding at this time unfortunately despite that progress that's been made he still has a significant issue going on here. 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 For that reason I discussed with him today the possibility of perform a wound culture to ensure there's nothing overtly infected. 01/06/18 on evaluation today patient's wound is larger and deeper than previously evaluated. With that being said it did appear that his wound was infected after my last evaluation with him. Subsequently I did end up prescribing a prescription for Bactrim DS which she has been taking and having no complication with. Fortunately there does not appear to be any evidence of infection at this point in time as far as anything spreading, no want to touch, and overall I feel like things are showing signs of improvement. 01/13/18 on evaluation today patient appears to be even a little larger and deeper than last time. There still muscle exposed in the base of the wound. Nonetheless he does appear to be less erythematous I do believe inflammation is calming down also believe the infection looks like it's probably resolved at this time  based on what I'm seeing. No fevers, chills, nausea, or vomiting noted at this time. 01/30/18 on evaluation today patient  actually appears to visually look better for the most part. Unfortunately those visually this looks better he does seem to potentially have what may be an abscess in the muscle that has been noted in the central portion of the wound. This is the first time that I have noted what appears to be fluctuance in the central portion of the muscle. With that being said I'm somewhat more concerned about the fact that this might indicate an abscess formation at this location. I do believe that an ultrasound would be appropriate. This is likely something we need to try to do as soon as possible. He has been switch to mupirocin ointment and he is no longer using the steroid ointment as prescribed by dermatology he sees them again next week he's been decreased from 60 to 40 mg of prednisone. 03/09/18 on evaluation today patient actually appears to be doing a little better compared to last time I saw him. There's not as much erythema surrounding the wound itself. He I did review his most recent infectious disease note which was dated 02/24/18. He saw Dr. Michel Bickers in [place]. 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 Patient is now off of antibiotics and they are continually observing this. There seems to be no change in the past two weeks in my pinion based on what the patient says and what I see today compared to what Dr. Megan Salon likely saw two weeks ago. No fevers, chills, nausea, or vomiting noted at this time. 03/23/18 on evaluation today patient's wound actually appears to be showing signs of improvement which is good news. He is currently still on the Dapsone. He is also working on tapering the prednisone to get off of this and Dr. Lottie Rater is working with him in this regard. Nonetheless overall I feel like the wound is doing well it does appear based on the infectious disease note that I reviewed from Dr. 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 I did also review the results of his ultrasound of left lower extremity which revealed there was a dentist tissue in the base of the wound without an abscess noted. 04/06/18 on evaluation today the patient's left lateral lower extremity ulcer actually appears to be doing fairly well which is excellent news. There does not appear to be any evidence of infection at this time which is also great news. Overall he still does have a significantly large ulceration although little by little he seems to be making progress. He is down to 10 mg a day of the prednisone. 04/20/18 on evaluation today patient actually appears to be doing excellent at this time in regard to his left lower extremity ulcer. He's making signs of good progress unfortunately this is taking much longer than we would really like to see but nonetheless he is making progress. Fortunately there does not appear to be any evidence of infection at this time. No fevers, chills, nausea, or vomiting noted at this time. The patient has not been using the Santyl due to the cost he hadn't got in this field yet. He's mainly been using the antibiotic ointment topically. Subsequently he also tells me that  he really has not been scrubbing in the shower I think this would be helpful again as I told him it doesn't have to be anything too aggressive to even make it believe just enough to keep it free of some of the loose slough and biofilm on the wound surface. 05/11/18 on evaluation today patient's wound appears to be making slow but sure progress in regard to the left lateral lower extremity ulcer. He is been tolerating the dressing changes without complication. Fortunately there does not appear to be any evidence of infection at this time. He is still just using triple antibiotic ointment along with clobetasol  occasionally over the area. He never got the Santyl and really does not seem to intend to in my pinion. 06/01/18 on evaluation today patient appears to be doing a little better in regard to his left lateral lower extremity ulcer. He states that overall he does not feel like he is doing as well with the Dapsone as he did with the prednisone. Nonetheless he sees his dermatologist later today and is gonna talk to them about the possibility of going back on the prednisone. Overall again I believe that the wound would be better if you would utilize Santyl but he really does not seem to be interested in going back to the [place] at this point. He has been using triple antibiotic ointment. 06/15/18 on evaluation today patient's wound actually appears to be doing about the same at this point. Fortunately there is no signs of infection at this time. He has made slight improvements although he continues to not really want to clean the wound bed at this point. He states that he just doesn't mess with it he doesn't want to cause any problems with everything else he has going on. He has been on medication, antibiotics as prescribed by his dermatologist, for a staff infection of his lower extremities which is really drying out now and looking much better he tells me. Fortunately there is no sign of overall infection. 06/29/18 on evaluation today patient appears to be doing well in regard to his left lateral lower surety ulcer all things considering. Fortunately his staff infection seems to be greatly improved compared to previous. He has no signs of infection and this is drying up quite nicely. He is still the doxycycline for this is no longer on cental, Dapsone, or any of the other medications. His dermatologist has recommended possibility of an infusion but right now he does not want to proceed with that. 07/13/18 on evaluation today patient appears to be doing about the same in regard to his left lateral lower surety  ulcer. Fortunately there's no signs of infection at this time which is great news. Unfortunately he still builds up a significant amount of Slough/biofilm of the surface of the wound he still is not really cleaning this as he should be appropriately. Again I'm able to easily with saline and gauze remove the majority of this on the surface which if you would do this at home would likely be a dramatic improvement for him as far as getting the area to improve. Nonetheless overall I still feel like he is making progress is just very slow. I think Santyl will be of benefit for him as well. Still he has not gotten this as of this point. 07/27/18 on evaluation today patient actually appears to be doing little worse in regards of the erythema around the periwound region of the wound he also tells me that he's been  having more drainage currently compared to what he was experiencing last time I saw him. He states not quite as bad as what he had because this was infected previously but nonetheless is still appears to be doing poorly. Fortunately there is no evidence of systemic infection at this point. The patient tells me that he is not going to be able to afford the Santyl. He is still waiting to hear about the infusion therapy with his dermatologist. Apparently she wants an updated colonoscopy first. [name], [name] ([id number]) 08/10/18 on evaluation today patient appears to be doing better in regard to his left lateral lower extremity ulcer. Fortunately he is showing signs of improvement in this regard he's actually been approved for Remicade infusion's as well although this has not been scheduled as of yet. Fortunately there's no signs of active infection at this time in regard to the wound although he is having some issues with infection of the right lower extremity is been seen as dermatologist for this. Fortunately they are definitely still working with him trying to keep things under control. 09/07/18 on  evaluation today patient is actually doing rather well in regard to his left lateral lower extremity ulcer. He notes these actually having some hair grow back on his extremity which is something he has not seen in years. He also tells me that the pain is really not giving them any trouble at this time which is also good news overall she is very pleased with the progress he's using a combination of the mupirocin along with the probate is all mixed. 09/21/18 on evaluation today patient actually appears to be doing fairly well all things considered in regard to his looks from the ulcer. He's been tolerating the dressing changes without complication. Fortunately there's no signs of active infection at this time which is good news he is still on all antibiotics or prevention of the staff infection. He has been on prednisone for time although he states it is gonna contact his dermatologist and see if she put them on a short course due to some irritation that he has going on currently. Fortunately there's no evidence of any overall worsening this is going very slow I think cental would be something that would be helpful for him although he states that $50 for tube is quite expensive. He therefore is not willing to get that at this point. 10/06/18 on evaluation today patient actually appears to be doing decently well in regard to his left lateral leg ulcer. He's been tolerating the dressing changes without complication. Fortunately there's no signs of active infection at this time. Overall I'm actually rather pleased with the progress he's making although it's slow he doesn't show any signs of infection and he does seem to be making some improvement. I do believe that he may need a switch up and dressings to try to help this to heal more appropriately and quickly. 10/19/18 on evaluation today patient actually appears to be doing better in regard to his left lateral lower extremity ulcer. This is shown signs  of having much less Slough buildup at this point due to the fact he has been using the [employer]. Obviously this is very good news. The overall size of the wound is not dramatically smaller but again the appearance is. 11/02/18 on evaluation today patient actually appears to be doing quite well in regard to his lower [place] ulcer. A lot of the skin around the ulcer is actually somewhat irritating at this point this seems  to be more due to the dressing causing irritation from the adhesive that anything else. Fortunately there is no signs of active infection at this time. 11/24/18 on evaluation today patient appears to be doing a little worse in regard to his overall appearance of his lower extremity ulcer. There's more erythema and warmth around the wound unfortunately. He is currently on doxycycline which he has been on for some time. With that being said I'm not sure that seems to be helping with what appears to possibly be an acute cellulitis with regard to his left lower extremity ulcer. No fevers, chills, nausea, or vomiting noted at this time. 12/08/18 on evaluation today patient's wounds actually appears to be doing significantly better compared to his last evaluation. He has been using Santyl along with alternating tripling about appointment as well as the steroid cream seems to be doing quite well and the wound is showing signs of improvement which is excellent news. Fortunately there's no evidence of infection and in fact his culture came back negative with only normal skin flora noted. 12/21/2018 upon evaluation today patient actually appears to be doing excellent with regard to his ulcer. This is actually the best that I have seen it since have been helping to take care of him. It is both smaller as well as less slough noted on the surface of the wound and seems to be showing signs of good improvement with new skin growing from the edges. He has been using just the triamcinolone he does wonder if  he can get a refill of that ointment today. 01/04/2019 upon evaluation today patient actually appears to be doing well with regard to his left lateral lower extremity ulcer. With that being said it does not appear to be that he is doing quite as well as last time as far as progression is concerned. There does not appear to be any signs of infection or significant irritation which is good news. With that being said I do believe that he may benefit from switching to a collagen based dressing based on how clean The wound appears. 01/18/2019 on evaluation today patient actually appears to be doing well with regard to his wound on the left lower extremity. He is not made a lot of progress compared to where we were previous but nonetheless does seem to be doing okay at this time which is good news. There is no signs of active infection which is also good news. My only concern currently is I do wish we can get him into utilizing the collagen dressing his insurance would not pay for the supplies that we ordered although it appears that he may be able to order this through his supply company that he typically utilizes. This is [employer]. Nonetheless he did try to order it during the office visit today and it appears this did go through. We will see if he can get that it is a different brand but nonetheless he has collagen and I do think will be beneficial. 02/01/2019 on evaluation today patient actually appears to be doing a little worse today in regard to the overall size of his wounds. Fortunately there is no signs of active infection at this time. That is visually. Nonetheless when this is happened before it was due to infection. For that reason were somewhat concerned about that this time as well. 02/08/2019 on evaluation today patient unfortunately appears to be doing slightly worse with regard to his wound upon evaluation today. Is measuring a little deeper  and a little larger unfortunately. I am not really  sure exactly what is causing this to enlarge he actually did see his dermatologist she is going to see about initiating Humira for him. Subsequently she also did do steroid injections into the wound itself in the periphery. Nonetheless still nonetheless he seems to be getting a little bit larger he is gone back to just using the steroid cream topically which I think is appropriate. I would say hold off on the collagen for the time being is definitely a good thing to do. Based on the culture results which we finally did get the final result back regarding it shows staph as the bacteria noted again that can be a normal skin bacteria based on the fact however he is having increased drainage and worsening of the wound measurement wise I would go ahead and place him on an antibiotic today I do believe for this. 02/15/2019 on evaluation today patient actually appears to be doing somewhat better in regard to his ulcer. There is no signs of worsening at this time I did review his culture results which showed evidence of Staphylococcus aureus but not MRSA. Again this could just be more related to the normal skin bacteria although he states the drainage has slowed down quite a bit he may have had a mild infection not just colonization. And was much smaller and then since around10/04/2019 on evaluation today patient appears to be doing unfortunately worse as far as the size of the wound. I really feel like that this is steadily getting larger again it had been doing excellent right at the beginning of September we have seen a steady increase in the area of the wound it is almost 2-1/2 times the size it was on September 1. Obviously this is a bad trend this is not wanting to see. For that reason we went back to using just the topical triamcinolone cream which does seem to help with inflammation. I checked him for bacteria by way of culture and nothing showed positive there. I am considering giving him a short  course of a tapering steroid Shykeem Resurreccion, [place] ([id number]) today to see if that is can be beneficial for him. The patient is in agreement with giving that a try. 03/08/2019 on evaluation today patient appears to be doing very well in comparison to last evaluation with regard to his lower extremity ulcer. This is showing signs of less inflammation and actually measuring slightly smaller compared to last time every other week over the past month and a half he has been measuring larger larger larger. Nonetheless I do believe that the issue has been inflammation the prednisone does seem to have been beneficial for him which is good news. No fevers, chills, nausea, vomiting, or diarrhea. 03/22/2019 on evaluation today patient appears to be doing about the same with regard to his leg ulcer. He has been tolerating the dressing changes without complication. With that being said the wound seems to be mostly arrested at its current size but really is not making any progress except for when we prescribed the prednisone. He did show some signs of dropping as far as the overall size of the wound during that interval week. Nonetheless this is something he is not on long-term at this point and unfortunately I think he is getting need either this or else the Humira which his dermatologist has discussed try to get approval for. With that being said he will be seeing his dermatologist on the 11th  of this month that is November. 04/19/2019 on evaluation today patient appears to be doing really about the same the wound is measuring slightly larger compared to last time I saw him. He has not been into the office since November 2 due to the fact that he unfortunately had Covid as that his entire family. He tells me that it was rough but they did pull-through and he seems to be doing much better. Fortunately there is no signs of active infection at this time. No fevers, chills, nausea, vomiting, or diarrhea. 05/10/2019  on evaluation today patient unfortunately appears to be doing significantly worse as compared to last time I saw him. He does tell me that he has had his first dose of Humira and actually is scheduled to get the next one in the upcoming week. With that being said he tells me also that in the past several days he has been having a lot of issues with green drainage she showed me a picture this is more blue-green in color. He is also been having issues with increased sloughy buildup and the wound does appear to be larger today. Obviously this is not the direction that we want everything to take based on the starting of his Humira. Nonetheless I think this is definitely a result of likely infection and to be honest I think this is probably Pseudomonas causing the infection based on what I am seeing. 05/24/2019 on evaluation today patient unfortunately appears to be doing significantly worse compared to his prior evaluation with me 2 weeks ago. I did review his culture results which showed that he does have Staph aureus as well as Pseudomonas noted on the culture. Nonetheless the Levaquin that I prescribed for him does not appear to have been appropriate and in fact he tells me he is no longer experiencing the green drainage and discharge that he had at the last visit. Fortunately there is no signs of active infection at this time which is good news although the wound has significantly worsened it in fact is much deeper than it was previous. We have been utilizing up to this point triamcinolone ointment as the prescription topical of choice but at this time I really feel like that the wound is getting need to be packed in order to appropriately manage this due to the deeper nature of the wound. Therefore something along the lines of an alginate dressing may be more appropriate. 05/31/2019 upon inspection today patient's wound actually showed signs of doing poorly at this point. Unfortunately he just does not  seem to be making any good progress despite what we have tried. He actually did go ahead and pick up the Cipro and start taking that as he was noticing more green drainage he had previously completed the Levaquin that I prescribed for him as well. Nonetheless he missed his appointment for the seventh last week on Wednesday with the wound care center and [facility] where his dermatologist referred him. Obviously I do think a second opinion would be helpful at this point especially in light of the fact that the patient seems to be doing so poorly despite the fact that we have tried everything that I really know how at this point. The only thing that ever seems to have helped him in the past is when he was on high doses of continual steroids that did seem to make a difference for him. Right now he is on immune modulating medication to try to help with the  pyoderma but I am not sure that he is getting as much relief at this point as he is previously obtained from the use of steroids. 06/07/2019 upon evaluation today patient unfortunately appears to be doing worse yet again with regard to his wound. In fact I am starting to question whether or not he may have a fluid pocket in the muscle at this point based on the bulging and the soft appearance to the central portion of the muscle area. There is not anything draining from the muscle itself at this time which is good news but nonetheless the wound is expanding. I am not really seeing any results of the Humira as far as overall wound progression based on what I am seeing at this point. The patient has been referred for second opinion with regard to his wound to the [facility] wound care center by his dermatologist which I definitely am not in opposition to. Unfortunately we tried multiple dressings in the past including collagen, alginate, and at one point even Hydrofera Blue. With that being said he is never really used it for any significant amount of time  due to the fact that he often complains of pain associated with these dressings and then will go back to either using the Santyl which she has done intermittently or more frequently the triamcinolone. He is also using his own compression stockings. We have wrapped him in the past but again that was something else that he really was not a big fan of. 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 He seemed to do much better when he was on the steroids although honestly I understand that the steroids are not necessarily the best medication to be on long-term obviously 06/14/2019 on evaluation today patient appears to be doing actually a little bit better with regard to the overall appearance with his leg. Unfortunately he does continue to have issues with what appears to be some fluid underneath the muscle although he did see the wound specialty center at [facility] last week their main goals were to see about infusion therapy in place of the Humira as they feel like that is not quite strong enough. They also recommended that we continue with the treatment otherwise as we are they felt like that was appropriate and they are okay with him continuing to follow-up here with [place] in that regard. With that being said they are also sending him to the vein specialist there to see about vein stripping and if that would be of benefit for him. Subsequently they also did not really address whether or not an ultrasound of the muscle area to see if there is anything that needs to be addressed here would be appropriate or not. For that reason I discussed this with him last week I think we may proceed down that  road at this point. 06/21/2019 upon evaluation today patient's wound actually appears to be doing slightly better compared to previous evaluations. I do believe that he has made a difference with regard to the progression here with the use of oral steroids. Again in the past has been the only thing that is really calm  things down. He does tell me that from [facility] is gotten a good news from there that there are no further vein stripping that is necessary at this point. I do not have that available for review today although the patient did relay this to me. He also did obtain and have the ultrasound of the wound completed which I did sign off on today. It does appear that there is no fluid collection under the muscle this is likely then just edematous tissue in general. That is also good news. Overall I still believe the inflammation is the main issue here. He did inquire about the possibility of a wound VAC again with the muscle protruding like it is I am not really sure whether the wound VAC is necessarily ideal or not. That is something we will have to consider although I do believe he may need compression wrapping to try to help with edema control which could potentially be of benefit. 06/28/2019 on evaluation today patient appears to be doing slightly better measurement wise although this is not terribly smaller he least seems to be trending towards that direction. With that being said he still seems to have purulent drainage noted in the wound bed at this time. He has been on Levaquin followed by Cipro over the past month. Unfortunately he still seems to have some issues with active infection at this time. I did perform a culture last week in order to evaluate and see if indeed there was still anything going on. Subsequently the culture did come back Birkel, Harun ([id number]) showing Pseudomonas which is consistent with the drainage has been having which is blue-green in color. He also has had an odor  that again was somewhat consistent with Pseudomonas as well. Long story short it appears that the culture showed an intermediate finding with regard to how well the Cipro will work for the Pseudomonas infection. Subsequently being that he does not seem to be clearing up and at best what we are doing is just keeping this at [place] I think he may need to see infectious disease to discuss IV antibiotic options. 07/05/2019 upon evaluation today patient appears to be doing okay in regard to his leg ulcer. He has been tolerating the dressing changes at this point without complication. Fortunately there is no signs of active infection at this time which is good news. No fevers, chills, nausea, vomiting, or diarrhea. With that being said he does have an appointment with infectious disease tomorrow and his primary care on Wednesday. Again the reason for the infectious disease referral was due to the fact that he did not seem to be fully resolving with the use of oral antibiotics and therefore we were thinking that IV antibiotic therapy may be necessary secondary to the fact that there was an intermediate finding for how effective the Cipro may be. Nonetheless again he has been having a lot of purulent and even green drainage. Fortunately right now that seems to have calmed down over the past week with the reinitiation of the oral antibiotic. Nonetheless we will see what Dr. Megan Salon has to say. 07/12/2019 upon evaluation today patient appears to be doing about the same at this point in regard to his left lower extremity ulcer. Fortunately there is no signs of active infection at this time which is good news I do believe the Levaquin has been beneficial I did review Dr. Hale Bogus note and to be honest I agree that the patient's leg does appear to be doing better  currently. What we found in the past as he does not seem to really completely resolve he will stop the antibiotic and then subsequently things will revert  back to having issues with blue-green drainage, increased pain, and overall worsening in general. Obviously that is the reason I sent him back to infectious disease. 07/19/2019 upon evaluation today patient appears to be doing roughly the same in size there is really no dramatic improvement. He has started back on the Levaquin at this point and though he seems to be doing okay he did still have a lot of blue/green drainage noted on evaluation today unfortunately. I think that this is still indicative more likely of a Pseudomonas infection as previously noted and again he does see Dr. Megan Salon in just a couple of days. I do not know that were really able to effectively clear this with just oral antibiotics alone based on what I am seeing currently. Nonetheless we are still continue to try to manage as best we can with regard to the patient and his wound. I do think the wrap was helpful in decreasing the edema which is excellent news. No fevers, chills, nausea, vomiting, or diarrhea. 07/26/2019 upon evaluation today patient appears to be doing slightly better with regard to the overall appearance of the muscle there is no dark discoloration centrally. Fortunately there is no signs of active infection at this time. No fevers, chills, nausea, vomiting, or diarrhea. Patient's wound bed currently the patient did have an appointment with Dr. Megan Salon at infectious disease last week. With that being said Dr. Megan Salon the patient states was still somewhat hesitant about put him on any IV antibiotics he wanted [place] to repeat cultures today and then see where things go going forward. He does look like Dr. Megan Salon because of some improvement the patient did have with the Levaquin wanted [place] to see about repeating cultures. If it indeed grows the Pseudomonas again then he recommended a possibility of considering a PICC line placement and IV antibiotic therapy. He plans to see the patient back in 1 to 2 weeks. 08/02/2019  upon evaluation today patient appears to be doing poorly with regard to his left lower extremity. We did get the results of his culture back it shows that he is still showing evidence of Pseudomonas which is consistent with the purulent/blue-green drainage that he has currently. Subsequently the culture also shows that he now is showing resistance to the oral fluoroquinolones which is unfortunate as that was really the only thing to treat the infection prior. I do believe that he is looking like this is going require IV antibiotic therapy to get this under control. Fortunately there is no signs of systemic infection at this time which is good news. The patient does see Dr. Megan Salon tomorrow. 08/09/2019 upon evaluation today patient appears to be doing better with regard to his left lower extremity ulcer in regard to the overall appearance. He is currently on IV antibiotic therapy. As ordered by Dr. Megan Salon. Currently the patient is on ceftazidime which she is going to take for the next 2 weeks and then follow-up for 4 to 5-week appointment with Dr. Megan Salon. The patient started this this past Friday symptoms have not for a total of 3 days currently in full. 08/16/2019 upon evaluation today patient's wound actually does show muscle in the base of the wound but in general does appear to be much better as far as the overall evidence of infection is concerned. In fact I feel like  this is for the most part cleared up he still on the IV antibiotics he has not completed the full course yet but I think he is doing much better which is excellent news. 08/23/2019 upon evaluation today patient appears to be doing about the same with regard to his wound at this point. He tells me that he still has pain unfortunately. Fortunately there is no evidence of systemic infection at this time which is great news. There is significant muscle protrusion. 09/13/19 upon evaluation today patient appears to be doing about the same  in regard to his leg unfortunately. He still has a lot of drainage coming from the ulceration there is still muscle exposed. With that being said the patient's last wound culture still showed an intermediate finding with regard to the Pseudomonas he still having the bluish/green drainage as well. Overall I do not know that the wound has completely cleared of infection at this point. Fortunately there is no signs of active infection systemically at this point which is good news. 09/20/2019 upon evaluation today patient's wound actually appears to be doing about the same based on what I am seeing currently. I do not see any signs of systemic infection he still does have evidence of some local infection and drainage. He did see Dr. Megan Salon last week and Dr. Megan Salon states that he probably does need a different IV antibiotic although he does not want to put him on this until the patient begins the Remicade infusion which is actually scheduled for about 10 days out from today on 13 May. Following that time Dr. Megan Salon is good to see him back and then will evaluate the feasibility of starting him on the IV antibiotic therapy once again at that point. I do not disagree with this plan I do believe as Dr. Megan Salon stated in his note that I reviewed today that the patient's issue is multifactorial with the pyoderma being 1 aspect of this that were hoping the Remicade will be helpful for her. In the meantime I think the gentamicin is, helping to keep things under decent okay control in regard to the ulcer. 09/27/2019 upon evaluation today patient appears to be doing about the same with regard to his wound still there is a lot of muscle exposure though he does have some hyper granulation tissue noted around the edge and actually some granulation tissue starting to form over the muscle which is actually good news. Fortunately there is no evidence of active infection which is also good news. His pain is less at this  point. 5/21; this is a patient I have not seen in a long time. 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 He has a large wound on the left lateral leg with protruding muscle. He comes in the clinic today showing the same area on his left medial ankle. He says there is been a spot there for some time although we have not previously defined this. Today he has a clearly defined area with slight amount of skin breakdown surrounded by raised areas with a purplish hue in color. This is not painful he says it is irritated. 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 He still has muscle protruding from the lateral portion of his left leg but fortunately the new area that they were concerned about at his last visit does not appear to have opened at this point. He  is currently on Remicade infusions and seems to be doing better in my opinion in fact the wound itself seems to be overall much better. The purplish discoloration that he did have seems to have resolved and I think that is a good sign that hopefully the Remicade is doing its job. He does [name], [name] ([id number]) have some biofilm noted over the surface of the wound. 11/01/2019 on evaluation today patient's wound actually appears to be doing excellent at this time. Fortunately there is no evidence of active infection and overall I feel like he is making great progress. The Remicade seems to be due excellent job in my opinion. 11/08/19 evaluation today vision actually appears to be doing quite well with regard to his weight ulcer. He's been tolerating dressing changes without complication. Fortunately there is no evidence of infection. No fevers, chills, nausea, or vomiting noted at this time. Overall states that is having more itching than pain which is actually a good sign in my opinion. 12/13/2019 upon  evaluation today patient appears to be doing well today with regard to his wound. He has been tolerating the dressing changes without complication. Fortunately there is no sign of active infection at this time. No fevers, chills, nausea, vomiting, or diarrhea. Overall I feel like the infusion therapy has been very beneficial for him. 01/06/2020 on evaluation today patient appears to be doing well with regard to his wound. This is measuring smaller and actually looks to be doing better. Fortunately there is no signs of active infection at this point. No fevers, chills, nausea, vomiting, or diarrhea. With that being said he does still have the blue-green drainage but this does not seem to be causing any significant issues currently. He has been using the gentamicin that does seem to be keeping things under decent control at this point. He goes later this morning for his next infusion therapy for the pyoderma which seems to also be very beneficial. 02/07/2020 on evaluation today patient appears to be doing about the same in regard to his wounds currently. Fortunately there is no signs of active infection systemically he does still have evidence of local infection still using gentamicin. He also is showing some signs of improvement albeit slowly I do feel like we are making some progress here. 02/21/2020 upon evaluation today patient appears to be making some signs of improvement the wound is measuring a little bit smaller which is great news and overall I am very pleased with where he stands currently. He is going to be having infusion therapy treatment on the 15th of this month. Fortunately there is no signs of active infection at this time. 03/13/2020 I do believe patient's wound is actually showing some signs of improvement here which is great news. He has continue with the infusion therapy through rheumatology/dermatology at [facility]. That does seem to be beneficial. I still think he gets as much benefit  from this as he did from the prednisone initially but nonetheless obviously this is less harsh on his body that the prednisone as far as they are concerned. 03/31/2020 on evaluation today patient's wound actually showing signs of some pretty good improvement in regard to the overall appearance of the wound bed. There is still muscle exposed though he does have some epithelial growth around the edges of the wound. Fortunately there is no signs of active infection at this time. No fevers, chills, nausea, vomiting, or diarrhea. Objective Constitutional Obese and well-hydrated in no acute distress. Vitals Time Taken: 8:12 AM, Height:  71 in, Weight: 338 lbs, BMI: 47.1, Temperature: 98.3 F, Pulse: 79 bpm, Respiratory Rate: 16 breaths/min, Blood Pressure: 131/81 mmHg. Respiratory normal breathing without difficulty. Psychiatric this patient is able to make decisions and demonstrates good insight into disease process. Alert and Oriented x 3. pleasant and cooperative. General Notes: Patient's wound bed currently showed signs of excellent granulation and some good epithelization around the edges of the wound he does still have muscle exposed in the base of the wound but again I feel like he is making progress. There is a lot of skin irritation from the dressings around the edges unfortunately he just cannot wrap this he has to have something adhesive but the adhesive does not sit well with his skin. Integumentary (Hair, Skin) Wound #1 status is Open. Original cause of wound was Gradually Appeared. The wound is located on the Left,Lateral Lower Leg. The wound measures 6.1cm length x 6cm width x 0.3cm depth; 28.746cm^2 area and 8.624cm^3 volume. There is muscle and Fat Layer (Subcutaneous Tissue) exposed. There is a large amount of purulent drainage noted. The wound margin is epibole. There is small (1-33%) pink granulation within the wound bed. There is a large (67-100%) amount of necrotic tissue within  the wound bed including Adherent Slough. 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 Anesthetic (add to Medication List): Wound #1 Left,Lateral Lower Leg: Topical Lidocaine 4% cream applied to wound bed prior to debridement (In Clinic Only). Primary Wound Dressing: Gentamicin Sulfate Cream Wound #1 Left,Lateral Lower Leg: Collagen Secondary Dressing: Wound #1 Left,Lateral Lower Leg: Boardered Foam Dressing Dressing Change Frequency: Wound #1 Left,Lateral Lower Leg: Change dressing every day. Follow-up Appointments: Wound #1 Left,Lateral Lower Leg: Return Appointment in 3 weeks. Edema Control: Wound #1 Left,Lateral Lower Leg: Patient to wear own compression stockings Elevate legs to the level of the heart and pump ankles as often as possible 1. Would recommend currently that we going continue with the wound care measures as before and the patient is in agreement with the plan. This includes the use of plain collagen followed by the gentamicin which should actually be applied first and then the collagen rather. 2. I am also can recommend he continue with his own compression stockings I think is still important to provide appropriate compression. 3. He continues with ongoing treatment with the immune modulating medications. Obviously that does seem to be helping turn this around although it is slow going. We will see patient back for reevaluation in 3 weeks here in the clinic. If anything worsens or changes patient will contact our office for additional recommendations. Electronic Signature(s) Signed: 03/31/2020 12:33:43 PM By: Worthy Keeler  PA-C Entered By: Worthy Keeler on 03/31/2020 12:33:43 Renbarger, [name] ([id number]) -------------------------------------------------------------------------------- SuperBill Details Patient Name: [name] Date of Service: 03/31/2020 Medical Record Number: [id number] Patient Account Number: [id number] Date of Birth/Sex: Oct 04, 1980 (39 y.o. M) Treating RN: Army Melia Primary Care Provider: [name] Other Clinician: Referring Provider: [name] Treating Provider/Extender: [name] Cliche in Treatment: 173 Diagnosis Coding ICD-10 Codes Code Description [phone] Non-pressure chronic ulcer of left calf with fat layer exposed L88 Pyoderma gangrenosum L97.321 Non-pressure chronic ulcer of left ankle limited to breakdown of skin I87.2 Venous insufficiency (chronic) (peripheral) L03.116 Cellulitis of left  lower limb Facility Procedures CPT4 Code: [id number] Description: 02233 - WOUND CARE VISIT-LEV 3 EST PT Modifier: Quantity: 1 Physician Procedures CPT4 Code: [id number] Description: 75300 - WC PHYS LEVEL 3 - EST PT Modifier: Quantity: 1 CPT4 Code: Description: ICD-10 Diagnosis Description L97.222 Non-pressure chronic ulcer of left calf with fat layer exposed L88 Pyoderma gangrenosum L97.321 Non-pressure chronic ulcer of left ankle limited to breakdown of skin I87.2 Venous insufficiency (chronic)  (peripheral) Modifier: Quantity: Electronic Signature(s) Signed: 03/31/2020 12:33:58 PM By: Worthy Keeler PA-C Entered By: Worthy Keeler on 03/31/2020 12:33:58

## 2020-03-31 NOTE — Progress Notes (Signed)
NAMARI, BRETON ([id number]) Visit Report for 03/31/2020 Arrival Information Details Patient Name: [name], [name] Date of Service: 03/31/2020 8:00 AM Medical Record Number: [id number] Patient Account Number: [id number] Date of Birth/Sex: [birth year] (39 y.o. M) Treating RN: Army Melia Primary Care Minie Roadcap: [name] Other Clinician: Referring Carmen Tolliver: [name] Treating Tsuyako Jolley/Extender: [name] Cliche in Treatment: 10 Visit Information History Since Last Visit Added or deleted any medications: No Patient Arrived: Ambulatory Any new allergies or adverse reactions: No Arrival Time: 08:13 Had a fall or experienced change in No Accompanied By: self activities of daily living that may affect Transfer Assistance: None risk of falls: Patient Identification Verified: Yes Signs or symptoms of abuse/neglect since last visito No Patient Requires Transmission-Based No Hospitalized since last visit: No Precautions: Has Dressing in Place as Prescribed: Yes Patient Has Alerts: Yes Pain Present Now: No Patient Alerts: 08/09/19 Picc Line Right Electronic Signature(s) Signed: 03/31/2020 9:05:22 AM By: Army Melia Entered By: Army Melia on 03/31/2020 08:13:40 [name], [name] ([id number]) -------------------------------------------------------------------------------- Clinic Level of Care Assessment Details Patient Name: [name] Date of Service: 03/31/2020 8:00 AM Medical Record Number: [id number] Patient Account Number: [id number] Date of Birth/Sex: September 08, 1980 (39 y.o. M) Treating RN: Army Melia Primary Care Kawan Vallad[place]: [name] Other Clinician: Referring Keren Alverio: [name] Treating Emersen Carroll/Extender: [name] Cliche in Treatment: [facility] Level of Care Assessment Items TOOL 4 Quantity Score []  - Use when only an EandM is performed on FOLLOW-UP visit 0 ASSESSMENTS - Nursing Assessment / Reassessment X - Reassessment of Co-morbidities (includes  updates in patient status) 1 10 X- 1 5 Reassessment of Adherence to Treatment Plan ASSESSMENTS - Wound and Skin Assessment / Reassessment X - Simple Wound Assessment / Reassessment - one wound 1 5 []  - 0 Complex Wound Assessment / Reassessment - multiple wounds []  - 0 Dermatologic / Skin Assessment (not related to wound area) ASSESSMENTS - Focused Assessment []  - Circumferential Edema Measurements - multi extremities 0 []  - 0 Nutritional Assessment / Counseling / Intervention []  - 0 Lower Extremity Assessment (monofilament, tuning fork, pulses) []  - 0 Peripheral Arterial Disease Assessment (using hand held doppler) ASSESSMENTS - Ostomy and/or Continence Assessment and Care []  - Incontinence Assessment and Management 0 []  - 0 Ostomy Care Assessment and Management (repouching, etc.) PROCESS - Coordination of Care X - Simple Patient / Family Education for ongoing care 1 15 []  - 0 Complex (extensive) Patient / Family Education for ongoing care []  - 0 Staff obtains Programmer, systems, Records, Test Results / Process Orders []  - 0 Staff telephones HHA, Nursing Homes / Clarify orders / etc []  - 0 Routine Transfer to another Facility (non-emergent condition) []  - 0 Routine Hospital Admission (non-emergent condition) []  - 0 New Admissions / Biomedical engineer / Ordering NPWT, Apligraf, etc. []  - 0 Emergency Hospital Admission (emergent condition) X- 1 10 Simple Discharge Coordination []  - 0 Complex (extensive) Discharge Coordination PROCESS - Special Needs []  - Pediatric / Minor Patient Management 0 []  - 0 Isolation Patient Management []  - 0 Hearing / Language / Visual special needs []  - 0 Assessment of Community assistance (transportation, D/C planning, etc.) []  - 0 Additional assistance / Altered mentation []  - 0 Support Surface(s) Assessment (bed, cushion, seat, etc.) INTERVENTIONS - Wound Cleansing / Measurement Whiston, Gurjit ([id number]) X- 1 5 Simple Wound Cleansing -  one wound []  - 0 Complex Wound Cleansing - multiple wounds X- 1 5 Wound Imaging (photographs - any number of wounds) []  - 0 Wound Tracing (instead of photographs) X- 1 5  Simple Wound Measurement - one wound []  - 0 Complex Wound Measurement - multiple wounds INTERVENTIONS - Wound Dressings []  - Small Wound Dressing one or multiple wounds 0 X- 1 15 Medium Wound Dressing one or multiple wounds []  - 0 Large Wound Dressing one or multiple wounds []  - 0 Application of Medications - topical []  - 0 Application of Medications - injection INTERVENTIONS - Miscellaneous []  - External ear exam 0 []  - 0 Specimen Collection (cultures, biopsies, blood, body fluids, etc.) []  - 0 Specimen(s) / Culture(s) sent or taken to Lab for analysis []  - 0 Patient Transfer (multiple staff / Civil Service fast streamer / Similar devices) []  - 0 Simple Staple / Suture removal (25 or less) []  - 0 Complex Staple / Suture removal (26 or more) []  - 0 Hypo / Hyperglycemic Management (close monitor of Blood Glucose) []  - 0 Ankle / Brachial Index (ABI) - do not check if billed separately X- 1 5 Vital Signs Has the patient been seen at the hospital within the last three years: Yes Total Score: 80 Level Of Care: New/Established - Level 3 Electronic Signature(s) Signed: 03/31/2020 9:05:22 AM By: Army Melia Entered By: Army Melia on 03/31/2020 08:23:40 [name] ([id number]) -------------------------------------------------------------------------------- Encounter Discharge Information Details Patient Name: [name] Date of Service: 03/31/2020 8:00 AM Medical Record Number: [id number] Patient Account Number: [id number] Date of Birth/Sex: September 03, 1980 (39 y.o. M) Treating RN: Army Melia Primary Care Imraan Wendell: [name] Other Clinician: Referring Shaylene Paganelli: [name] Treating Kathey Simer/Extender: [name] Cliche in Treatment: [phone] Encounter Discharge Information Items Discharge Condition:  Stable Ambulatory Status: Ambulatory Discharge Destination: Home Transportation: Private Auto Accompanied By: self Schedule Follow-up Appointment: Yes Clinical Summary of Care: Electronic Signature(s) Signed: 03/31/2020 9:05:22 AM By: Army Melia Entered By: Army Melia on 03/31/2020 08:24:25 [name] ([id number]) -------------------------------------------------------------------------------- Lower Extremity Assessment Details Patient Name: [name] Date of Service: 03/31/2020 8:00 AM Medical Record Number: [id number] Patient Account Number: [id number] Date of Birth/Sex: January 07, 1981 (39 y.o. M) Treating RN: Army Melia Primary Care Cornellius Kropp: [name] Other Clinician: Referring Shaeley Segall: [name] Treating Bari Handshoe/Extender: Jeri Cos Weeks in Treatment: 173 Edema Assessment Assessed: [Left: No] [Right: No] Edema: [Left: Ye] [Right: s] Vascular Assessment Pulses: Dorsalis Pedis Palpable: [Left:Yes] Electronic Signature(s) Signed: 03/31/2020 9:05:22 AM By: Army Melia Entered By: Army Melia on 03/31/2020 08:15:17 [name], [name] ([id number]) -------------------------------------------------------------------------------- Multi Wound Chart Details Patient Name: [name] Date of Service: 03/31/2020 8:00 AM Medical Record Number: [id number] Patient Account Number: [id number] Date of Birth/Sex: 1980-05-31 (39 y.o. M) Treating RN: Army Melia Primary Care Kadra Kohan: [name] Other Clinician: Referring Tylor Gambrill: [name] Treating Jaton Eilers/Extender: [name] Cliche in Treatment: 173 Vital Signs Height(in): 71 Pulse(bpm): 32 Weight(lbs): 338 Blood Pressure(mmHg): 131/81 Body Mass Index(BMI): 47 Temperature(F): 98.3 Respiratory Rate(breaths/min): 16 Photos: [N/A:N/A] Wound Location: Left, Lateral Lower Leg N/A N/A Wounding Event: Gradually Appeared N/A N/A Primary Etiology: Pyoderma N/A N/A Comorbid History: Sleep Apnea,  Hypertension, Colitis N/A N/A Date Acquired: 11/18/2015 N/A N/A Weeks of Treatment: 173 N/A N/A Wound Status: Open N/A N/A Measurements L x W x D (cm) 6.1x6x0.3 N/A N/A Area (cm) : 28.746 N/A N/A Volume (cm) : 8.624 N/A N/A % Reduction in Area: -485.60% N/A N/A % Reduction in Volume: -119.60% N/A N/A Classification: Full Thickness With Exposed N/A N/A Support Structures Exudate Amount: Large N/A N/A Exudate Type: Purulent N/A N/A Exudate Color: yellow, brown, green N/A N/A Wound Margin: Epibole N/A N/A Granulation Amount: Small (1-33%) N/A N/A Granulation Quality: Pink N/A N/A Necrotic Amount: Large (67-100%)  N/A N/A Exposed Structures: Fat Layer (Subcutaneous Tissue): N/A N/A Yes Muscle: Yes Fascia: No Tendon: No Joint: No Bone: No Epithelialization: None N/A N/A Treatment Notes Electronic Signature(s) Signed: 03/31/2020 9:05:22 AM By: Army Melia Entered By: Army Melia on 03/31/2020 08:21:35 [name] ([id number]) -------------------------------------------------------------------------------- Multi-Disciplinary Care Plan Details Patient Name: [name] Date of Service: 03/31/2020 8:00 AM Medical Record Number: [id number] Patient Account Number: [id number] Date of Birth/Sex: 1980-09-06 (39 y.o. M) Treating RN: Army Melia Primary Care Shamela Haydon: [name] Other Clinician: Referring Tatianna Ibbotson: [name] Treating Shernita Rabinovich/Extender: [name] Cliche in Treatment: [phone] Active Inactive Venous Leg Ulcer Nursing Diagnoses: Knowledge deficit related to disease process and management Potential for venous Insuffiency (use before diagnosis confirmed) Goals: Non-invasive venous studies are completed as ordered Date Initiated: 12/11/2016 Target Resolution Date: 03/13/2017 Goal Status: Active Patient will maintain optimal edema control Date Initiated: 12/11/2016 Target Resolution Date: 03/13/2017 Goal Status: Active Patient/caregiver will verbalize  understanding of disease process and disease management Date Initiated: 12/11/2016 Target Resolution Date: 03/13/2017 Goal Status: Active Verify adequate tissue perfusion prior to therapeutic compression application Date Initiated: 12/11/2016 Target Resolution Date: 03/13/2017 Goal Status: Active Interventions: Assess peripheral edema status every visit. Compression as ordered Provide education on venous insufficiency Treatment Activities: Therapeutic compression applied : 12/11/2016 Notes: Wound/Skin Impairment Nursing Diagnoses: Impaired tissue integrity Knowledge deficit related to ulceration/compromised skin integrity Goals: Patient/caregiver will verbalize understanding of skin care regimen Date Initiated: 12/11/2016 Target Resolution Date: 03/13/2017 Goal Status: Active Ulcer/skin breakdown will have a volume reduction of 30% by week 4 Date Initiated: 12/11/2016 Target Resolution Date: 03/13/2017 Goal Status: Active Ulcer/skin breakdown will have a volume reduction of 50% by week 8 Date Initiated: 12/11/2016 Target Resolution Date: 03/13/2017 Goal Status: Active Ulcer/skin breakdown will have a volume reduction of 80% by week 12 Date Initiated: 12/11/2016 Target Resolution Date: 03/13/2017 Goal Status: Active Ulcer/skin breakdown will heal within 14 weeks Date Initiated: 12/11/2016 Target Resolution Date: 03/13/2017 Goal Status: Active [name], [name] ([id number]) Interventions: Assess patient/caregiver ability to obtain necessary supplies Assess patient/caregiver ability to perform ulcer/skin care regimen upon admission and as needed Assess ulceration(s) every visit Provide education on ulcer and skin care Treatment Activities: Skin care regimen initiated : 12/11/2016 Topical wound management initiated : 12/11/2016 Notes: Electronic Signature(s) Signed: 03/31/2020 9:05:22 AM By: Army Melia Entered By: Army Melia on 03/31/2020 08:21:21 [name]  ([id number]) -------------------------------------------------------------------------------- Pain Assessment Details Patient Name: [name] Date of Service: 03/31/2020 8:00 AM Medical Record Number: [id number] Patient Account Number: [id number] Date of Birth/Sex: [birth year] (39 y.o. M) Treating RN: Army Melia Primary Care Chonita Gadea: [name] Other Clinician: Referring Leilani Cespedes: [name] Treating Allaina Brotzman/Extender: [name] Cliche in Treatment: [phone] Active Problems Location of Pain Severity and Description of Pain Patient Has Paino No Site Locations Pain Management and Medication Current Pain Management: Electronic Signature(s) Signed: 03/31/2020 9:05:22 AM By: Army Melia Entered By: Army Melia on 03/31/2020 08:14:02 [name] ([id number]) -------------------------------------------------------------------------------- Patient/Caregiver Education Details Patient Name: [name] Date of Service: 03/31/2020 8:00 AM Medical Record Number: [id number] Patient Account Number: [id number] Date of Birth/Gender: 1981-04-24 (39 y.o. M) Treating RN: Army Melia Primary Care Physician: [name] Other Clinician: Referring Physician: [name] Treating Physician/Extender: [name] Cliche in Treatment: [phone] Education Assessment Education Provided To: Patient Education Topics Provided Wound/Skin Impairment: Handouts: Caring for Your Ulcer Methods: Demonstration, Explain/Verbal Responses: State content correctly Electronic Signature(s) Signed: 03/31/2020 9:05:22 AM By: Army Melia Entered By: Army Melia on 03/31/2020 08:23:56 [name], [name] ([id number]) -------------------------------------------------------------------------------- Wound Assessment Details Patient Name: [name],  [name] Date of Service: 03/31/2020 8:00 AM Medical Record Number: [id number] Patient Account Number: [id number] Date of Birth/Sex: 1980-08-15 (39 y.o. M) Treating RN:  Army Melia Primary Care Tarnesha Ulloa: [name] Other Clinician: Referring Hakim Minniefield: [name] Treating Zowie Lundahl/Extender: [name] Cliche in Treatment: 173 Wound Status Wound Number: 1 Primary Etiology: Pyoderma Wound Location: Left, Lateral Lower Leg Wound Status: Open Wounding Event: Gradually Appeared Comorbid History: Sleep Apnea, Hypertension, Colitis Date Acquired: 11/18/2015 Weeks Of Treatment: 173 Clustered Wound: No Photos Wound Measurements Length: (cm) 6.1 Width: (cm) 6 Depth: (cm) 0.3 Area: (cm) 28.746 Volume: (cm) 8.624 % Reduction in Area: -485.6% % Reduction in Volume: -119.6% Epithelialization: None Wound Description Classification: Full Thickness With Exposed Support Structures Wound Margin: Epibole Exudate Amount: Large Exudate Type: Purulent Exudate Color: yellow, brown, green Foul Odor After Cleansing: No Slough/Fibrino Yes Wound Bed Granulation Amount: Small (1-33%) Exposed Structure Granulation Quality: Pink Fascia Exposed: No Necrotic Amount: Large (67-100%) Fat Layer (Subcutaneous Tissue) Exposed: Yes Necrotic Quality: Adherent Slough Tendon Exposed: No Muscle Exposed: Yes Necrosis of Muscle: No Joint Exposed: No Bone Exposed: No Treatment Notes Wound #1 (Left, Lateral Lower Leg) Notes [name], [name] ([id number]) Electronic Signature(s) Signed: 03/31/2020 9:05:22 AM By: Army Melia Entered By: Army Melia on 03/31/2020 08:14:59 [name] ([id number]) -------------------------------------------------------------------------------- Vitals Details Patient Name: [name] Date of Service: 03/31/2020 8:00 AM Medical Record Number: [id number] Patient Account Number: [id number] Date of Birth/Sex: September 11, 1980 (39 y.o. M) Treating RN: Army Melia Primary Care Amayah Staheli: [name] Other Clinician: Referring Chucky Homes: [name] Treating Isadore Palecek/Extender: [name] Cliche in Treatment:  173 Vital Signs Time Taken: 08:12 Temperature (F): 98.3 Height (in): 71 Pulse (bpm): 79 Weight (lbs): 338 Respiratory Rate (breaths/min): 16 Body Mass Index (BMI): 47.1 Blood Pressure (mmHg): 131/81 Reference Range: 80 - 120 mg / dl Electronic Signature(s) Signed: 03/31/2020 9:05:22 AM By: Army Melia Entered By: Army Melia on 03/31/2020 08:13:56

## 2020-04-18 ENCOUNTER — Other Ambulatory Visit: Payer: Self-pay | Admitting: Primary Care

## 2020-04-18 DIAGNOSIS — E119 Type 2 diabetes mellitus without complications: Secondary | ICD-10-CM

## 2020-04-24 ENCOUNTER — Other Ambulatory Visit: Payer: Self-pay

## 2020-04-24 ENCOUNTER — Encounter: Payer: 59 | Attending: Physician Assistant | Admitting: Physician Assistant

## 2020-04-24 DIAGNOSIS — G4730 Sleep apnea, unspecified: Secondary | ICD-10-CM | POA: Diagnosis not present

## 2020-04-24 DIAGNOSIS — L97222 Non-pressure chronic ulcer of left calf with fat layer exposed: Secondary | ICD-10-CM | POA: Diagnosis not present

## 2020-04-24 DIAGNOSIS — L03116 Cellulitis of left lower limb: Secondary | ICD-10-CM | POA: Diagnosis not present

## 2020-04-24 DIAGNOSIS — K5190 Ulcerative colitis, unspecified, without complications: Secondary | ICD-10-CM | POA: Diagnosis not present

## 2020-04-24 DIAGNOSIS — L88 Pyoderma gangrenosum: Secondary | ICD-10-CM | POA: Insufficient documentation

## 2020-04-24 DIAGNOSIS — T781XXA Other adverse food reactions, not elsewhere classified, initial encounter: Secondary | ICD-10-CM | POA: Diagnosis not present

## 2020-04-24 DIAGNOSIS — I872 Venous insufficiency (chronic) (peripheral): Secondary | ICD-10-CM | POA: Insufficient documentation

## 2020-04-24 DIAGNOSIS — I10 Essential (primary) hypertension: Secondary | ICD-10-CM | POA: Diagnosis not present

## 2020-04-24 DIAGNOSIS — L280 Lichen simplex chronicus: Secondary | ICD-10-CM | POA: Insufficient documentation

## 2020-04-24 DIAGNOSIS — L97321 Non-pressure chronic ulcer of left ankle limited to breakdown of skin: Secondary | ICD-10-CM | POA: Insufficient documentation

## 2020-04-24 NOTE — Progress Notes (Addendum)
LARNCE, SCHNACKENBERG ([id number]) Visit Report for 04/24/2020 Chief Complaint Document Details Patient Name: [name], [name] Date of Service: 04/24/2020 8:00 AM Medical Record Number: [id number] Patient Account Number: [id number] Date of Birth/Sex: 1981/04/17 (39 y.o. 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 M) Treating RN: [name] Primary Care Provider: [name] Other Clinician: Referring Provider: [name] Treating Provider/Extender: [name] Cliche in Treatment: 176 History of Present Illness HPI Description: 12/04/16; 39 year old man who comes into the clinic today for review of a wound on the posterior left calf. He tells me that is been there for about a year. He is not a diabetic he does smoke half a pack per day. He was seen in the ER on 11/20/16 felt to have cellulitis around the wound and was given clindamycin. An x-ray did not show osteomyelitis. The patient initially tells me that he has a milk allergy that sets off a pruritic itching rash on his lower legs which she scratches incessantly and he thinks that's what may have set up the wound. He has been using various topical antibiotics and ointments without any effect. He works in a [employer] and is on his feet all day. He does not have a prior history of  wounds however he does have the rash on both lower legs the right arm and the ventral aspect of his left arm. These are excoriations and clearly have had scratching however there are of macular looking areas on both legs including a substantial larger area on the right leg. This does not have an underlying open area. There is no blistering. The patient tells me that 2 years ago in [place] in response to the rash on his legs he saw a dermatologist who told him he had a condition which may be pyoderma gangrenosum although I may be putting words into his mouth. He seemed to recognize this. 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 He is not in any treatment for this. He's had no recent travel 12/11/16; the patient arrives today with his wound and roughly the same condition we've been using silver alginate this is a deep punched out wound with some surrounding erythema but no tenderness. Biopsy I did did not show confirmed pyoderma gangrenosum suggested nonspecific inflammation and vasculitis but does not provide an actual description of what was seen by the pathologist. I'm really not able to understand this We have also received information from the patient's dermatologist in [place] notes from April 2016. This was a doctor Agarwal-antal. 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 He was prescribed topical steroid high potency under occlusion which helped but at this point the patient did not have a deep punched out wound. 12/18/16; the patient's wound is larger in terms of surface area however this surface looks better and there is less depth. The surrounding erythema also is better. The patient states that the wrap we put on came off 2 days ago when he has been using his compression stockings.  He we are in the process of getting a dermatology consult. 12/26/16 on evaluation today patient's left lower extremity wound shows evidence of infection with  surrounding erythema noted. He has been tolerating the dressing changes but states that he has noted more discomfort. There is a larger area of erythema surrounding the wound. No fevers, chills, nausea, or vomiting noted at this time. With that being said the wound still does have slough covering the surface. He is not allergic to any medication that he is aware of at this point. In regard to his right lower extremity he had several regions that are erythematous and pruritic he wonders if there's anything we can do to help that. 01/02/17 I reviewed patient's wound culture which was obtained his visit last week. He was placed on doxycycline at that point. Unfortunately that does not appear to be an antibiotic that would likely help with the situation however the pseudomonas noted on culture is sensitive to Cipro. Also unfortunately patient's wound seems to have a large compared to last week's evaluation. Not severely so but there are definitely increased measurements in general. He is continuing to have discomfort as well he writes this to be a seven out of 10. In fact he would prefer me not to perform any debridement today due to the fact that he is having discomfort and considering he has an active infection on the little reluctant to do so anyway. No fevers, chills, nausea, or vomiting noted at this time. 01/08/17; patient seems dermatology on September 5. I suspect dermatology will want the slides from the biopsy I did sent to their pathologist. I'm not sure if there is a way we can expedite that. In any case the culture I did before I left on vacation 3 weeks ago showed Pseudomonas he was given 10 days of Cipro and per her description of her intake nurses is actually somewhat better this week although the wound is quite a bit bigger than I remember the last time I saw this. He still has 3 more days of Cipro 01/21/17; dermatology appointment tomorrow. He has completed the ciprofloxacin for Pseudomonas.  Surface of the wound looks better however he is had some deterioration in the lesions on his right leg. Meantime the left lateral leg wound we will continue with sample 01/29/17; patient had his dermatology appointment but I can't yet see that note. He is completed his antibiotics. The wound is more superficial but considerably larger in circumferential area than when he came in. This is in his left lateral calf. He also has swollen erythematous areas with superficial wounds on the right leg and small papular areas on both arms. There apparently areas in her his upper thighs and buttocks I did not look at those. Dermatology biopsied the right leg. Hopefully will have their input next week. 02/05/17; patient went back to see his dermatologist who told him that he had a "scratching problem" as well as staph. He is now on a 30 day course of doxycycline and I believe she gave him triamcinolone cream to the right leg areas to help with the itching [not exactly sure but probably triamcinolone]. She apparently looked at the left lateral leg wound although this was not rebiopsied and I think felt to be ultimately part of the same pathogenesis. He is using sample border foam and changing nevus himself. He now has a new open area on the right posterior leg which was his biopsy site I don't have any of the dermatology notes  02/12/17; we put the patient in compression last week with SANTYL to the wound on the left leg and the biopsy. Edema is much better and the depth of the wound is now at level of skin. Area is still the same oBiopsy site on the right lateral leg we've also been using santyl with a border foam dressing and he is changing this himself. 02/19/17; Using silver alginate started last week to both the substantial left leg wound and the biopsy site on the right wound. He is tolerating compression well. Has a an appointment with his primary M.D. tomorrow wondering about diuretics although I'm wondering if  the edema problem is actually lymphedema 02/26/17; the patient has been to see his primary doctor Dr. Jerrel Ivory at [facility] our primary care. She started him on Lasix 20 mg and this seems to have helped with the edema. However we are not making substantial change with the left lateral calf wound and inflammation. The biopsy site on the right leg also looks stable but not really all that different. 03/12/17; the patient has been to see vein and vascular Dr. Lucky Cowboy. He has had venous reflux studies I have not reviewed these. I did get a call from his dermatology office. They felt that he might have pathergy based on their biopsy on his right leg which led them to look at the slides of MIRO, BALDERSON ([id number]) the biopsy I did on the left leg and they wonder whether this represents pyoderma gangrenosum which was the original supposition in a man with ulcerative colitis albeit inactive for many years. They therefore recommended clobetasol and tetracycline i.e. 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 03/26/17; apparently the patient just had reflux studies not an appointment with Dr. dew. She arrives in clinic today having applied clobetasol for 2-3 weeks. He notes over the last 2-3 days excessive drainage having to change the dressing 3-4 times a day and also expanding erythema. 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 Lillia Carmel at [facility] dermatology locally who kindly saw him at my request. 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 My concern was the degree of erythema medially extending from his left leg wound which was either inflammation from pyoderma or cellulitis. I  put him on Augmentin however culture of the wound showed Pseudomonas which is quinolone sensitive. I really don't believe he has cellulitis however in view of everything I will continue and give him a course of Cipro. He is also on doxycycline as an immune modulator for the pyoderma. In addition to his original wound on the left lateral leg with surrounding erythema he has a wound on the right posterior calf which was an original biopsy site done by dermatology. 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 Saw Dr. Lillia Carmel yesterday. He has been using topical antibiotics to both wound areas his original wound on the left and the biopsies/pathergy area on the right. There is definitely some improvement in the inflammation around the wound on the right although the patient states he has increasing sensitivity of the wounds. He is on prednisone 60 and doxycycline 1 as prescribed by Dr. Lillia Carmel. He is covering the topical antibiotic with gauze  and putting this in his own compression stocks and changing this daily. He states that Dr. Lottie Rater did a culture of the left leg wound yesterday 05/07/17; pyoderma gangrenosum. The patient saw Dr. Lillia Carmel yesterday and has a follow-up with her in one month. He is still using topical antibiotics to both wounds although he can't recall exactly what type. He is still on prednisone 60 mg. Dr. Lillia Carmel stated that the doxycycline could stop if we were in agreement. 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 The right medial leg was induced by biopsy/pathergy. The area on the right is essentially healed. 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 The right medial leg has closed and remains closed. He is still on  prednisone 60. oHe tells me he missed his last dermatology appointment with Dr. Lillia Carmel but will make another appointment. He reports that her blood sugar at a recent screen in [place] was high 200's. He was 180 today. 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 I think he is going to require still much longer prednisone perhaps another 3 months before attempting to taper. In the meantime his wound is a lot better. Smaller. He is cleaning this off daily and applying topical antibiotics. When he was last in the clinic I thought about changing to [facility] and actually put in a couple of calls to dermatology although probably not during their business hours. 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 He continues on oral prednisone. He has been using triple antibiotic ointment. There is surface debris and we will transition to [facility] and have him return in 2 weeks. He has lost 30 pounds since his last appointment with lifestyle modification. He may benefit from topical steroid cream for treatment this can be considered at a later date. 08/22/17 on evaluation today patient appears to actually be doing rather well in regard to his left lateral lower extremity ulcer. He has actually been managed by Dr. Dellia Nims most recently. Patient is currently on oral steroids at this time. This seems to have been of benefit for him. Nonetheless his last visit was actually with Leah on 08/06/17. Currently he is not utilizing any topical steroid creams although this could be of benefit as well. No fevers, chills, nausea, or vomiting noted at this time. 09/05/17 on evaluation today patient appears to be doing better in regard to his left lateral lower extremity ulcer. He has been tolerating the dressing changes without complication. He is using Santyl with good effect. Overall I'm very  pleased with how things are standing at this point. Patient likewise is happy that this is doing better. 09/19/17 on evaluation today patient actually appears to be doing rather well in regard to his left lateral lower extremity ulcer. 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 He's not having any significant pain. 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 He has been tolerating the Santyl without complication and in general I feel like he's making good progress. 10/17/17 on evaluation today patient  appears to be doing very well in regard to his left lateral lower surety ulcer. He has been tolerating the dressing changes without complication. There does not appear to be any evidence of infection he's alternating the Santyl and the triple antibiotic ointment every other day this seems to be doing well for him. 11/03/17 on evaluation today patient appears to be doing very well in regard to his left lateral lower extremity ulcer. He is been tolerating the dressing changes without complication which is good news. Fortunately there does not appear to be any evidence of infection which is also great news. Overall is doing excellent they are starting to taper down on the prednisone is down to 40 mg at this point it also started topical clobetasol for him. 11/17/17 on evaluation today patient appears to be doing well in regard to his left lateral lower surety ulcer. He's been tolerating the dressing changes without complication. He does note that he is having no pain, no excessive drainage or discharge, and overall he feels like things are going about how he would expect and hope they would. Overall he seems to have no evidence of infection at this time in my opinion which is good news. 12/04/17-He is seen in follow-up evaluation  for right lateral lower extremity ulcer. He has been applying topical steroid cream. Today's measurement show slight increase in size. Over the next 2 weeks we will transition to every other day Santyl and steroid cream. He has been encouraged to monitor for changes and notify clinic with any concerns 12/15/17 on evaluation today patient's left lateral motion the ulcer and fortunately is doing worse again at this point. This just since last week to this week has close to doubled in size according to the patient. I did not seeing last week's I do not have a visual to compare this to in our system was also down so we do not have all the charts and at this point. Nonetheless it does have me somewhat concerned in regard to the fact that again he was worried enough about it he has contact the dermatology that placed them back on the full strength, 50 mg a day of the prednisone that he was taken previous. He continues to alternate using clobetasol along with Santyl at this point. He is obviously somewhat frustrated. 12/22/17 on evaluation today patient appears to be doing a little worse compared to last evaluation. Unfortunately the wound is a little deeper and slightly larger than the last week's evaluation. With that being said he has made some progress in regard to the irritation surrounding at this time unfortunately despite that progress that's been made he still has a significant issue going on here. 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 LISTER, BRIZZI ([id number]) For that reason I discussed with him today the possibility of perform a wound culture to ensure there's nothing overtly infected. 01/06/18 on evaluation today patient's wound is larger and deeper than previously evaluated. With that being said it did appear that his wound was infected after  my last evaluation with him. Subsequently I did end up prescribing a prescription for Bactrim DS which she has been taking and having no complication with. Fortunately there does not appear to be any evidence of infection at this point in time as far as anything spreading, no want to touch, and overall I feel like  things are showing signs of improvement. 01/13/18 on evaluation today patient appears to be even a little larger and deeper than last time. There still muscle exposed in the base of the wound. Nonetheless he does appear to be less erythematous I do believe inflammation is calming down also believe the infection looks like it's probably resolved at this time based on what I'm seeing. No fevers, chills, nausea, or vomiting noted at this time. 01/30/18 on evaluation today patient actually appears to visually look better for the most part. Unfortunately those visually this looks better he does seem to potentially have what may be an abscess in the muscle that has been noted in the central portion of the wound. This is the first time that I have noted what appears to be fluctuance in the central portion of the muscle. With that being said I'm somewhat more concerned about the fact that this might indicate an abscess formation at this location. I do believe that an ultrasound would be appropriate. This is likely something we need to try to do as soon as possible. He has been switch to mupirocin ointment and he is no longer using the steroid ointment as prescribed by dermatology he sees them again next week he's been decreased from 60 to 40 mg of prednisone. 03/09/18 on evaluation today patient actually appears to be doing a little better compared to last time I saw him. There's not as much erythema surrounding the wound itself. He I did review his most recent infectious disease note which was dated 02/24/18. He saw Dr. Michel Bickers in [place]. 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 Patient is now off of antibiotics and they are continually observing this. There seems to be no change in the past two weeks in my pinion based on what the patient says and what I see today compared to what Dr. Megan Salon likely saw two weeks ago. No fevers, chills, nausea, or vomiting noted at this time. 03/23/18 on evaluation today patient's wound actually appears to be showing signs of improvement which is good news. He is currently still on the Dapsone. He is also working on tapering the prednisone to get off of this and Dr. Lottie Rater is working with him in this regard. Nonetheless overall I feel like the wound is doing well it does appear based on the infectious disease note that I reviewed from Dr. 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 I did also review the results of his ultrasound of left lower extremity which revealed there was a dentist tissue in the base of the wound without an abscess noted. 04/06/18 on evaluation today the patient's left lateral lower extremity ulcer actually appears to be doing fairly well which is excellent news. There does not appear to be any evidence of infection at this time which is also great news. Overall he still does have a significantly large ulceration although little by little he seems to be making progress. He is down to 10 mg a day of the prednisone. 04/20/18 on evaluation today patient actually appears to be doing excellent at this time in regard to his left lower extremity ulcer. He's making signs of good progress unfortunately this is taking much longer than we would really like to see but nonetheless he is  making progress. Fortunately there does not appear to be any evidence of infection at this time. No fevers, chills, nausea, or vomiting noted at this time.  The patient has not been using the Santyl due to the cost he hadn't got in this field yet. He's mainly been using the antibiotic ointment topically. Subsequently he also tells me that he really has not been scrubbing in the shower I think this would be helpful again as I told him it doesn't have to be anything too aggressive to even make it believe just enough to keep it free of some of the loose slough and biofilm on the wound surface. 05/11/18 on evaluation today patient's wound appears to be making slow but sure progress in regard to the left lateral lower extremity ulcer. He is been tolerating the dressing changes without complication. Fortunately there does not appear to be any evidence of infection at this time. He is still just using triple antibiotic ointment along with clobetasol occasionally over the area. He never got the Santyl and really does not seem to intend to in my pinion. 06/01/18 on evaluation today patient appears to be doing a little better in regard to his left lateral lower extremity ulcer. He states that overall he does not feel like he is doing as well with the Dapsone as he did with the prednisone. Nonetheless he sees his dermatologist later today and is gonna talk to them about the possibility of going back on the prednisone. Overall again I believe that the wound would be better if you would utilize Santyl but he really does not seem to be interested in going back to the [place] at this point. He has been using triple antibiotic ointment. 06/15/18 on evaluation today patient's wound actually appears to be doing about the same at this point. Fortunately there is no signs of infection at this time. He has made slight improvements although he continues to not really want to clean the wound bed at this point. He states that he just doesn't mess with it he doesn't want to cause any problems with everything else he has going on. He has been on medication, antibiotics as prescribed  by his dermatologist, for a staff infection of his lower extremities which is really drying out now and looking much better he tells me. Fortunately there is no sign of overall infection. 06/29/18 on evaluation today patient appears to be doing well in regard to his left lateral lower surety ulcer all things considering. Fortunately his staff infection seems to be greatly improved compared to previous. He has no signs of infection and this is drying up quite nicely. He is still the doxycycline for this is no longer on cental, Dapsone, or any of the other medications. His dermatologist has recommended possibility of an infusion but right now he does not want to proceed with that. 07/13/18 on evaluation today patient appears to be doing about the same in regard to his left lateral lower surety ulcer. Fortunately there's no signs of infection at this time which is great news. Unfortunately he still builds up a significant amount of Slough/biofilm of the surface of the wound he still is not really cleaning this as he should be appropriately. Again I'm able to easily with saline and gauze remove the majority of this on the surface which if you would do this at home would likely be a dramatic improvement for him as far as getting the area to improve. Nonetheless overall I still feel like he  is making progress is just very slow. I think Santyl will be of benefit for him as well. Still he has not gotten this as of this point. 07/27/18 on evaluation today patient actually appears to be doing little worse in regards of the erythema around the periwound region of the wound he also tells me that he's been having more drainage currently compared to what he was experiencing last time I saw him. He states not quite as bad as what he had because this was infected previously but nonetheless is still appears to be doing poorly. Fortunately there is no evidence of systemic infection at this point. The patient tells me that  he is not going to be able to afford the Santyl. He is still waiting to hear about the infusion therapy with his dermatologist. Apparently she wants an updated colonoscopy first. 08/10/18 on evaluation today patient appears to be doing better in regard to his left lateral lower extremity ulcer. Fortunately he is showing signs of improvement in this regard he's actually been approved for Remicade infusion's as well although this has not been scheduled as of yet. Fortunately there's no signs of active infection at this time in regard to the wound although he is having some issues with infection of the right lower extremity is been seen as dermatologist for this. Fortunately they are definitely still working with him trying to keep things under control. MISHON, BLUBAUGH ([id number]) 09/07/18 on evaluation today patient is actually doing rather well in regard to his left lateral lower extremity ulcer. He notes these actually having some hair grow back on his extremity which is something he has not seen in years. He also tells me that the pain is really not giving them any trouble at this time which is also good news overall she is very pleased with the progress he's using a combination of the mupirocin along with the probate is all mixed. 09/21/18 on evaluation today patient actually appears to be doing fairly well all things considered in regard to his looks from the ulcer. He's been tolerating the dressing changes without complication. Fortunately there's no signs of active infection at this time which is good news he is still on all antibiotics or prevention of the staff infection. He has been on prednisone for time although he states it is gonna contact his dermatologist and see if she put them on a short course due to some irritation that he has going on currently. Fortunately there's no evidence of any overall worsening this is going very slow I think cental would be something that would be helpful for him  although he states that $50 for tube is quite expensive. He therefore is not willing to get that at this point. 10/06/18 on evaluation today patient actually appears to be doing decently well in regard to his left lateral leg ulcer. He's been tolerating the dressing changes without complication. Fortunately there's no signs of active infection at this time. Overall I'm actually rather pleased with the progress he's making although it's slow he doesn't show any signs of infection and he does seem to be making some improvement. I do believe that he may need a switch up and dressings to try to help this to heal more appropriately and quickly. 10/19/18 on evaluation today patient actually appears to be doing better in regard to his left lateral lower extremity ulcer. This is shown signs of having much less Slough buildup at this point due to the fact he has been using  the Santyl. Obviously this is very good news. The overall size of the wound is not dramatically smaller but again the appearance is. 11/02/18 on evaluation today patient actually appears to be doing quite well in regard to his lower [place] ulcer. A lot of the skin around the ulcer is actually somewhat irritating at this point this seems to be more due to the dressing causing irritation from the adhesive that anything else. Fortunately there is no signs of active infection at this time. 11/24/18 on evaluation today patient appears to be doing a little worse in regard to his overall appearance of his lower extremity ulcer. There's more erythema and warmth around the wound unfortunately. He is currently on doxycycline which he has been on for some time. With that being said I'm not sure that seems to be helping with what appears to possibly be an acute cellulitis with regard to his left lower extremity ulcer. No fevers, chills, nausea, or vomiting noted at this time. 12/08/18 on evaluation today patient's wounds actually appears to be doing  significantly better compared to his last evaluation. He has been using Santyl along with alternating tripling about appointment as well as the steroid cream seems to be doing quite well and the wound is showing signs of improvement which is excellent news. Fortunately there's no evidence of infection and in fact his culture came back negative with only normal skin flora noted. 12/21/2018 upon evaluation today patient actually appears to be doing excellent with regard to his ulcer. This is actually the best that I have seen it since have been helping to take care of him. It is both smaller as well as less slough noted on the surface of the wound and seems to be showing signs of good improvement with new skin growing from the edges. He has been using just the triamcinolone he does wonder if he can get a refill of that ointment today. 01/04/2019 upon evaluation today patient actually appears to be doing well with regard to his left lateral lower extremity ulcer. With that being said it does not appear to be that he is doing quite as well as last time as far as progression is concerned. There does not appear to be any signs of infection or significant irritation which is good news. With that being said I do believe that he may benefit from switching to a collagen based dressing based on how clean The wound appears. 01/18/2019 on evaluation today patient actually appears to be doing well with regard to his wound on the left lower extremity. He is not made a lot of progress compared to where we were previous but nonetheless does seem to be doing okay at this time which is good news. There is no signs of active infection which is also good news. My only concern currently is I do wish we can get him into utilizing the collagen dressing his insurance would not pay for the supplies that we ordered although it appears that he may be able to order this through his supply company that he typically utilizes. This is  [employer]. Nonetheless he did try to order it during the office visit today and it appears this did go through. We will see if he can get that it is a different brand but nonetheless he has collagen and I do think will be beneficial. 02/01/2019 on evaluation today patient actually appears to be doing a little worse today in regard to the overall size of his wounds. Fortunately there  is no signs of active infection at this time. That is visually. Nonetheless when this is happened before it was due to infection. For that reason were somewhat concerned about that this time as well. 02/08/2019 on evaluation today patient unfortunately appears to be doing slightly worse with regard to his wound upon evaluation today. Is measuring a little deeper and a little larger unfortunately. I am not really sure exactly what is causing this to enlarge he actually did see his dermatologist she is going to see about initiating Humira for him. Subsequently she also did do steroid injections into the wound itself in the periphery. Nonetheless still nonetheless he seems to be getting a little bit larger he is gone back to just using the steroid cream topically which I think is appropriate. I would say hold off on the collagen for the time being is definitely a good thing to do. Based on the culture results which we finally did get the final result back regarding it shows staph as the bacteria noted again that can be a normal skin bacteria based on the fact however he is having increased drainage and worsening of the wound measurement wise I would go ahead and place him on an antibiotic today I do believe for this. 02/15/2019 on evaluation today patient actually appears to be doing somewhat better in regard to his ulcer. There is no signs of worsening at this time I did review his culture results which showed evidence of Staphylococcus aureus but not MRSA. Again this could just be more related to the normal skin bacteria  although he states the drainage has slowed down quite a bit he may have had a mild infection not just colonization. And was much smaller and then since around10/04/2019 on evaluation today patient appears to be doing unfortunately worse as far as the size of the wound. I really feel like that this is steadily getting larger again it had been doing excellent right at the beginning of September we have seen a steady increase in the area of the wound it is almost 2-1/2 times the size it was on September 1. Obviously this is a bad trend this is not wanting to see. For that reason we went back to using just the topical triamcinolone cream which does seem to help with inflammation. I checked him for bacteria by way of culture and nothing showed positive there. I am considering giving him a short course of a tapering steroid Dosepak today to see if that is can be beneficial for him. The patient is in agreement with giving that a try. 03/08/2019 on evaluation today patient appears to be doing very well in comparison to last evaluation with regard to his lower extremity ulcer. This is showing signs of less inflammation and actually measuring slightly smaller compared to last time every other week over the past month and a half he has been measuring larger larger larger. Nonetheless I do believe that the issue has been inflammation the prednisone does seem to [facility] ([id number]) have been beneficial for him which is good news. No fevers, chills, nausea, vomiting, or diarrhea. 03/22/2019 on evaluation today patient appears to be doing about the same with regard to his leg ulcer. He has been tolerating the dressing changes without complication. With that being said the wound seems to be mostly arrested at its current size but really is not making any progress except for when we prescribed the prednisone. He did show some signs of dropping as far as  the overall size of the wound during that interval week.  Nonetheless this is something he is not on long-term at this point and unfortunately I think he is getting need either this or else the Humira which his dermatologist has discussed try to get approval for. With that being said he will be seeing his dermatologist on the 11th of this month that is November. 04/19/2019 on evaluation today patient appears to be doing really about the same the wound is measuring slightly larger compared to last time I saw him. He has not been into the office since November 2 due to the fact that he unfortunately had Covid as that his entire family. He tells me that it was rough but they did pull-through and he seems to be doing much better. Fortunately there is no signs of active infection at this time. No fevers, chills, nausea, vomiting, or diarrhea. 05/10/2019 on evaluation today patient unfortunately appears to be doing significantly worse as compared to last time I saw him. He does tell me that he has had his first dose of Humira and actually is scheduled to get the next one in the upcoming week. With that being said he tells me also that in the past several days he has been having a lot of issues with green drainage she showed me a picture this is more blue-green in color. He is also been having issues with increased sloughy buildup and the wound does appear to be larger today. Obviously this is not the direction that we want everything to take based on the starting of his Humira. Nonetheless I think this is definitely a result of likely infection and to be honest I think this is probably Pseudomonas causing the infection based on what I am seeing. 05/24/2019 on evaluation today patient unfortunately appears to be doing significantly worse compared to his prior evaluation with me 2 weeks ago. I did review his culture results which showed that he does have Staph aureus as well as Pseudomonas noted on the culture. Nonetheless the Levaquin that I prescribed for him does  not appear to have been appropriate and in fact he tells me he is no longer experiencing the green drainage and discharge that he had at the last visit. Fortunately there is no signs of active infection at this time which is good news although the wound has significantly worsened it in fact is much deeper than it was previous. We have been utilizing up to this point triamcinolone ointment as the prescription topical of choice but at this time I really feel like that the wound is getting need to be packed in order to appropriately manage this due to the deeper nature of the wound. Therefore something along the lines of an alginate dressing may be more appropriate. 05/31/2019 upon inspection today patient's wound actually showed signs of doing poorly at this point. Unfortunately he just does not seem to be making any good progress despite what we have tried. He actually did go ahead and pick up the Cipro and start taking that as he was noticing more green drainage he had previously completed the Levaquin that I prescribed for him as well. Nonetheless he missed his appointment for the seventh last week on Wednesday with the wound care center and [facility] where his dermatologist referred him. Obviously I do think a second opinion would be helpful at this point especially in light of the fact that the patient seems to be doing so poorly despite the fact  that we have tried everything that I really know how at this point. The only thing that ever seems to have helped him in the past is when he was on high doses of continual steroids that did seem to make a difference for him. 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 06/07/2019 upon evaluation today patient unfortunately appears to be doing worse yet again with regard to his wound. In fact I am  starting to question whether or not he may have a fluid pocket in the muscle at this point based on the bulging and the soft appearance to the central portion of the muscle area. There is not anything draining from the muscle itself at this time which is good news but nonetheless the wound is expanding. I am not really seeing any results of the Humira as far as overall wound progression based on what I am seeing at this point. The patient has been referred for second opinion with regard to his wound to the [facility] wound care center by his dermatologist which I definitely am not in opposition to. Unfortunately we tried multiple dressings in the past including collagen, alginate, and at one point even Hydrofera Blue. With that being said he is never really used it for any significant amount of time due to the fact that he often complains of pain associated with these dressings and then will go back to either using the Santyl which she has done intermittently or more frequently the triamcinolone. He is also using his own compression stockings. We have wrapped him in the past but again that was something else that he really was not a big fan of. 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 He seemed to do much better when he was on the steroids although honestly I understand that the steroids are not necessarily the best medication to be on long-term obviously 06/14/2019 on evaluation today patient appears to be doing actually a little bit better with regard to the overall appearance with his leg. Unfortunately he does continue to have issues with what appears to be some fluid underneath the  muscle although he did see the wound specialty center at [facility] last week their main goals were to see about infusion therapy in place of the Humira as they feel like that is not quite strong enough. They also recommended that we continue with the treatment otherwise as we are they felt like that was appropriate and they are okay with him continuing to follow-up here with [place] in that regard. With that being said they are also sending him to the vein specialist there to see about vein stripping and if that would be of benefit for him. Subsequently they also did not really address whether or not an ultrasound of the muscle area to see if there is anything that needs to be addressed here would be appropriate or not. For that reason I discussed this with him last week I think we  may proceed down that road at this point. 06/21/2019 upon evaluation today patient's wound actually appears to be doing slightly better compared to previous evaluations. I do believe that he has made a difference with regard to the progression here with the use of oral steroids. Again in the past has been the only thing that is really calm things down. He does tell me that from [facility] is gotten a good news from there that there are no further vein stripping that is necessary at this point. I do not have that available for review today although the patient did relay this to me. He also did obtain and have the ultrasound of the wound completed which I did sign off on today. It does appear that there is no fluid collection under the muscle this is likely then just edematous tissue in general. That is also good news. Overall I still believe the inflammation is the main issue here. He did inquire about the possibility of a wound VAC again with the muscle protruding like it is I am not really sure whether the wound VAC is necessarily ideal or not. That is something we will have to consider although I do believe he may need compression wrapping to  try to help with edema control which could potentially be of benefit. 06/28/2019 on evaluation today patient appears to be doing slightly better measurement wise although this is not terribly smaller he least seems to be trending towards that direction. With that being said he still seems to have purulent drainage noted in the wound bed at this time. He has been on Levaquin followed by Cipro over the past month. Unfortunately he still seems to have some issues with active infection at this time. I did perform a culture last week in order to evaluate and see if indeed there was still anything going on. Subsequently the culture did come back showing Pseudomonas which is consistent with the drainage has been having which is blue-green in color. He also has had an odor that again was somewhat consistent with Pseudomonas as well. Long story short it appears that the culture showed an intermediate finding with regard to how well the Cipro will work for the Pseudomonas infection. Subsequently being that he does not seem to be clearing up and at best what we are doing is just keeping this at [place] I think he may need to see infectious disease to discuss IV antibiotic options. AASIM, RESTIVO ([id number]) 07/05/2019 upon evaluation today patient appears to be doing okay in regard to his leg ulcer. He has been tolerating the dressing changes at this point without complication. Fortunately there is no signs of active infection at this time which is good news. No fevers, chills, nausea, vomiting, or diarrhea. With that being said he does have an appointment with infectious disease tomorrow and his primary care on Wednesday. Again the reason for the infectious disease referral was due to the fact that he did not seem to be fully resolving with the use of oral antibiotics and therefore we were thinking that IV antibiotic therapy may be necessary secondary to the fact that there was an intermediate finding for  how effective the Cipro may be. Nonetheless again he has been having a lot of purulent and even green drainage. Fortunately right now that seems to have calmed down over the past week with the reinitiation of the oral antibiotic. Nonetheless we will see what Dr. Megan Salon has to say. 07/12/2019 upon evaluation today patient appears to be  doing about the same at this point in regard to his left lower extremity ulcer. Fortunately there is no signs of active infection at this time which is good news I do believe the Levaquin has been beneficial I did review Dr. Hale Bogus note and to be honest I agree that the patient's leg does appear to be doing better currently. What we found in the past as he does not seem to really completely resolve he will stop the antibiotic and then subsequently things will revert back to having issues with blue-green drainage, increased pain, and overall worsening in general. Obviously that is the reason I sent him back to infectious disease. 07/19/2019 upon evaluation today patient appears to be doing roughly the same in size there is really no dramatic improvement. He has started back on the Levaquin at this point and though he seems to be doing okay he did still have a lot of blue/green drainage noted on evaluation today unfortunately. I think that this is still indicative more likely of a Pseudomonas infection as previously noted and again he does see Dr. Megan Salon in just a couple of days. I do not know that were really able to effectively clear this with just oral antibiotics alone based on what I am seeing currently. Nonetheless we are still continue to try to manage as best we can with regard to the patient and his wound. I do think the wrap was helpful in decreasing the edema which is excellent news. No fevers, chills, nausea, vomiting, or diarrhea. 07/26/2019 upon evaluation today patient appears to be doing slightly better with regard to the overall appearance of the muscle  there is no dark discoloration centrally. Fortunately there is no signs of active infection at this time. No fevers, chills, nausea, vomiting, or diarrhea. Patient's wound bed currently the patient did have an appointment with Dr. Megan Salon at infectious disease last week. With that being said Dr. Megan Salon the patient states was still somewhat hesitant about put him on any IV antibiotics he wanted [place] to repeat cultures today and then see where things go going forward. He does look like Dr. Megan Salon because of some improvement the patient did have with the Levaquin wanted [place] to see about repeating cultures. If it indeed grows the Pseudomonas again then he recommended a possibility of considering a PICC line placement and IV antibiotic therapy. He plans to see the patient back in 1 to 2 weeks. 08/02/2019 upon evaluation today patient appears to be doing poorly with regard to his left lower extremity. We did get the results of his culture back it shows that he is still showing evidence of Pseudomonas which is consistent with the purulent/blue-green drainage that he has currently. Subsequently the culture also shows that he now is showing resistance to the oral fluoroquinolones which is unfortunate as that was really the only thing to treat the infection prior. I do believe that he is looking like this is going require IV antibiotic therapy to get this under control. Fortunately there is no signs of systemic infection at this time which is good news. The patient does see Dr. Megan Salon tomorrow. 08/09/2019 upon evaluation today patient appears to be doing better with regard to his left lower extremity ulcer in regard to the overall appearance. He is currently on IV antibiotic therapy. As ordered by Dr. Megan Salon. Currently the patient is on ceftazidime which she is going to take for the next 2 weeks and then follow-up for 4 to 5-week appointment with Dr. Megan Salon.  The patient started this this past Friday  symptoms have not for a total of 3 days currently in full. 08/16/2019 upon evaluation today patient's wound actually does show muscle in the base of the wound but in general does appear to be much better as far as the overall evidence of infection is concerned. In fact I feel like this is for the most part cleared up he still on the IV antibiotics he has not completed the full course yet but I think he is doing much better which is excellent news. 08/23/2019 upon evaluation today patient appears to be doing about the same with regard to his wound at this point. He tells me that he still has pain unfortunately. Fortunately there is no evidence of systemic infection at this time which is great news. There is significant muscle protrusion. 09/13/19 upon evaluation today patient appears to be doing about the same in regard to his leg unfortunately. He still has a lot of drainage coming from the ulceration there is still muscle exposed. With that being said the patient's last wound culture still showed an intermediate finding with regard to the Pseudomonas he still having the bluish/green drainage as well. Overall I do not know that the wound has completely cleared of infection at this point. Fortunately there is no signs of active infection systemically at this point which is good news. 09/20/2019 upon evaluation today patient's wound actually appears to be doing about the same based on what I am seeing currently. I do not see any signs of systemic infection he still does have evidence of some local infection and drainage. He did see Dr. Megan Salon last week and Dr. Megan Salon states that he probably does need a different IV antibiotic although he does not want to put him on this until the patient begins the Remicade infusion which is actually scheduled for about 10 days out from today on 13 May. Following that time Dr. Megan Salon is good to see him back and then will evaluate the feasibility of starting him on the  IV antibiotic therapy once again at that point. I do not disagree with this plan I do believe as Dr. Megan Salon stated in his note that I reviewed today that the patient's issue is multifactorial with the pyoderma being 1 aspect of this that were hoping the Remicade will be helpful for her. In the meantime I think the gentamicin is, helping to keep things under decent okay control in regard to the ulcer. 09/27/2019 upon evaluation today patient appears to be doing about the same with regard to his wound still there is a lot of muscle exposure though he does have some hyper granulation tissue noted around the edge and actually some granulation tissue starting to form over the muscle which is actually good news. Fortunately there is no evidence of active infection which is also good news. His pain is less at this point. 5/21; this is a patient I have not seen in a long time. 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 He has a large wound on the left lateral leg with protruding muscle. He comes in the clinic today showing the same area on his left medial ankle. He says there is been a spot there for some time although we have not previously defined this. Today he has a clearly defined area with slight amount of skin breakdown surrounded by raised areas with a purplish hue in color. This is not painful he says it is irritated.  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 He still has muscle protruding from the lateral portion of his left leg but fortunately the new area that they were concerned about at his last visit does not appear to have opened at this point. He is currently on Remicade infusions and seems to be doing better in my opinion in fact the wound itself seems to be overall much better. The purplish discoloration that he did have seems to have resolved  and I think that is a good sign that hopefully the Remicade is doing its job. He does have some biofilm noted over the surface of the wound. 11/01/2019 on evaluation today patient's wound actually appears to be doing excellent at this time. Fortunately there is no evidence of active infection and overall I feel like he is making great progress. The Remicade seems to be due excellent job in my opinion. KYLIL, SWOPES ([id number]) 11/08/19 evaluation today vision actually appears to be doing quite well with regard to his weight ulcer. He's been tolerating dressing changes without complication. Fortunately there is no evidence of infection. No fevers, chills, nausea, or vomiting noted at this time. Overall states that is having more itching than pain which is actually a good sign in my opinion. 12/13/2019 upon evaluation today patient appears to be doing well today with regard to his wound. He has been tolerating the dressing changes without complication. Fortunately there is no sign of active infection at this time. No fevers, chills, nausea, vomiting, or diarrhea. Overall I feel like the infusion therapy has been very beneficial for him. 01/06/2020 on evaluation today patient appears to be doing well with regard to his wound. This is measuring smaller and actually looks to be doing better. Fortunately there is no signs of active infection at this point. No fevers, chills, nausea, vomiting, or diarrhea. With that being said he does still have the blue-green drainage but this does not seem to be causing any significant issues currently. He has been using the gentamicin that does seem to be keeping things under decent control at this point. He goes later this morning for his next infusion therapy for the pyoderma which seems to also be very beneficial. 02/07/2020 on evaluation today patient appears to be doing about the same in regard to his wounds currently. Fortunately there is no signs of active infection  systemically he does still have evidence of local infection still using gentamicin. He also is showing some signs of improvement albeit slowly I do feel like we are making some progress here. 02/21/2020 upon evaluation today patient appears to be making some signs of improvement the wound is measuring a little bit smaller which is great news and overall I am very pleased with where he stands currently. He is going to be having infusion therapy treatment on the 15th of this month. Fortunately there is no signs of active infection at this time. 03/13/2020 I do believe patient's wound is actually showing some signs of improvement here which is great news. He has continue with the infusion therapy through rheumatology/dermatology at [facility]. That does seem to be beneficial. I still think he gets as much benefit from this as he did from the prednisone initially but nonetheless obviously this is less harsh on his body that the prednisone as far as they are concerned. 03/31/2020 on evaluation today patient's wound actually showing signs of some pretty good improvement in regard  to the overall appearance of the wound bed. There is still muscle exposed though he does have some epithelial growth around the edges of the wound. Fortunately there is no signs of active infection at this time. No fevers, chills, nausea, vomiting, or diarrhea. 04/24/2020 upon evaluation today patient appears to be doing about the same in regard to his leg ulcer. He has been tolerating the dressing changes without complication. Fortunately there is no signs of active infection at this time. No fevers, chills, nausea, vomiting, or diarrhea. With that being said he still has a lot of irritation from the bandaging around the edges of the wound. We did discuss today the possibility of a referral to plastic surgery. Electronic Signature(s) Signed: 04/24/2020 9:57:07 AM By: Worthy Keeler PA-C Entered By: Worthy Keeler on 04/24/2020  09:57:07 ALTIN, SEASE ([id number]) -------------------------------------------------------------------------------- Physical Exam Details Patient Name: [name] Date of Service: 04/24/2020 8:00 AM Medical Record Number: [id number] Patient Account Number: [id number] Date of Birth/Sex: 1981/01/21 (39 y.o. M) Treating RN: [name] Primary Care Provider: [name] Other Clinician: Referring Provider: [name] Treating Provider/Extender: [name] Cliche in Treatment: 69 Constitutional Well-nourished and well-hydrated in no acute distress. Respiratory normal breathing without difficulty. Psychiatric this patient is able to make decisions and demonstrates good insight into disease process. Alert and Oriented x 3. pleasant and cooperative. Notes Upon inspection patient's wound bed actually showed signs of good granulation at this time. There does appear to still be muscle noted in the base of the wound he does have some somewhat rolled edges and to be honest though I do feel like he is making progress here this is extremely slow. I do believe a consult with plastic surgery could be warranted and helpful for him. I am also going to help him with smoking cessation we did have a discussion about that today around 5 minutes in length. I also did subsequently prescribe him Chantix and the starter pack. He has used this before with good result he tells me. Electronic Signature(s) Signed: 04/24/2020 9:57:53 AM By: Worthy Keeler PA-C Entered By: Worthy Keeler on 04/24/2020 09:57:53 Blagg, [name] ([id number]) -------------------------------------------------------------------------------- Physician Orders Details Patient Name: [name] Date of Service: 04/24/2020 8:00 AM Medical Record Number: [id number] Patient Account Number: [id number] Date of Birth/Sex: 1980/10/15 (39 y.o. 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 Anesthetic (add to Medication List) Wound #1 Left,Lateral Lower Leg o Topical Lidocaine 4% cream applied to wound bed prior to debridement (In Clinic Only). Primary Wound Dressing o Gentamicin Sulfate Cream Wound #1 Left,Lateral Lower Leg o Collagen - Patient wants to order collagen powder to wound. Secondary Dressing Wound #1 Left,Lateral Lower Leg o Boardered Foam Dressing Dressing Change Frequency Wound #1 Left,Lateral Lower Leg o Change dressing every day. Follow-up Appointments Wound #1 Left,Lateral Lower Leg o Return Appointment in 3 weeks. Edema Control Wound #1 Left,Lateral Lower Leg o Patient to wear own compression stockings o Elevate legs to the level of the heart and pump ankles as often as possible Consults o Plastic  Surgery - Referral for skin graft at [facility]. Patient Medications Allergies: milk, Biaxin, seasonal Notifications Medication Indication Start End Chantix Starting Month Box 04/24/2020 TREVIS, EDEN ([id number]) Notifications Medication Indication Start End DOSE 0 - oral 0.5 mg (11) 1 mg (42)- 1 mg (42) tablets,dose pack - tablets,dose pack oral for starting month. Take per dose pack instructions Electronic Signature(s) Signed: 04/24/2020 8:43:33 AM By: Worthy Keeler PA-C Entered By: Worthy Keeler on 04/24/2020 08:43:32 Gillies, Glendon  ([id number]) -------------------------------------------------------------------------------- Problem List Details Patient Name: [name] Date of Service: 04/24/2020 8:00 AM Medical Record Number: [id number] Patient Account Number: [id number] Date of Birth/Sex: [birth year] (39 y.o. M) Treating RN: [name] Primary Care Provider: [name] Other Clinician: Referring Provider: [name] Treating Provider/Extender: [name] Cliche in Treatment: 176 Active Problems ICD-10 Encounter Code Description Active Date MDM Diagnosis L97.222 Non-pressure chronic ulcer of left calf with fat layer exposed 12/04/2016 No Yes L88 Pyoderma gangrenosum 03/26/2017 No Yes L97.321 Non-pressure chronic ulcer of left ankle limited to breakdown of skin 10/08/2019 No Yes I87.2 Venous insufficiency (chronic) (peripheral) 12/04/2016 No Yes L03.116 Cellulitis of left lower limb 05/24/2019 No Yes F17.208 Nicotine dependence, unspecified, with other nicotine-induced disorders 04/24/2020 No Yes Inactive Problems ICD-10 Code Description Active Date Inactive Date L97.213 Non-pressure chronic ulcer of right calf with necrosis of muscle 04/02/2017 04/02/2017 Resolved Problems Electronic Signature(s) Signed: 04/24/2020 9:58:19 AM By: Worthy Keeler PA-C Previous Signature: 04/24/2020 8:25:40 AM Version By: Worthy Keeler PA-C Entered By: Worthy Keeler on 04/24/2020 09:58:19 Shoberg, Viraat ([id number]) -------------------------------------------------------------------------------- Progress Note Details Patient Name: [name] Date of Service: 04/24/2020 8:00 AM Medical Record Number: [id number] Patient Account Number: [id number] Date of Birth/Sex: [birth year] (39 y.o. M) Treating RN: [name] Primary Care Provider: [name] Other Clinician: Referring Provider: [name] Treating Provider/Extender: [name] Cliche in Treatment: 176 Subjective Chief Complaint Information obtained  from Patient He is here in follow up evaluation for LLE pyoderma ulcer History of Present Illness (HPI) 12/04/16; 39 year old man who comes into the clinic today for review of a wound on the posterior left calf. He tells me that is been there for about a year. He is not a diabetic he does smoke half a pack per day. He was seen in the ER on 11/20/16 felt to have cellulitis around the wound and was given clindamycin. An x-ray did not show osteomyelitis. The patient initially tells me that he has a milk allergy that sets off a pruritic itching rash on his lower legs which she scratches incessantly and he thinks that's what may have set up the wound. He has been using various topical antibiotics and ointments without any effect. He works in a [employer] and is on his feet all day. He does not have a prior history of wounds however he does have the rash on both lower legs the right arm and the ventral aspect of his left arm. These are excoriations and clearly have had scratching however there are of macular looking areas on both legs including a substantial larger area on the right leg. This does not have an underlying open area. There is no blistering. The patient tells me that 2 years ago in [place] in response to the rash on his legs he saw a dermatologist who told him he had a condition which may be pyoderma gangrenosum although I may be putting words into his mouth. He seemed to recognize this. 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 He is not in any treatment for this. He's had no recent travel 12/11/16; the patient arrives today with his wound and roughly the same condition we've been using silver alginate this is a deep punched out wound with some surrounding erythema but no tenderness. Biopsy I did did not show confirmed pyoderma gangrenosum suggested nonspecific inflammation and vasculitis but does not provide an actual description of what was seen by  the pathologist. I'm really not able to understand this We have also received information from the patient's dermatologist in [place] notes from April 2016. This was a doctor Agarwal-antal. 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 He was prescribed topical steroid high potency under occlusion which helped but at this point the patient did not have a deep punched out wound. 12/18/16; the patient's wound is larger in terms of surface area however this surface looks better and there is less depth. The surrounding erythema also is better. The patient states that the wrap we put on came off 2 days ago when he has been using his compression stockings. He we are in the process of getting a dermatology consult. 12/26/16 on evaluation today patient's left lower extremity wound shows evidence of infection with surrounding erythema noted. He has been tolerating the dressing changes but states that he has noted more discomfort. There is a larger area of erythema surrounding the wound. No fevers, chills, nausea, or vomiting noted at this time. With that being said the wound still does have slough covering the surface. He is not allergic to any medication that he is aware of at this point. In regard to his right lower extremity he had several regions that are erythematous and pruritic he wonders if there's anything we can do to help that. 01/02/17 I reviewed patient's wound culture which was obtained his visit last week. He was placed on doxycycline at that point. Unfortunately that does not appear to be an antibiotic that would likely help with the situation however the pseudomonas noted on culture is sensitive to Cipro. Also unfortunately patient's wound seems to have a large compared to last week's evaluation. Not severely so but there are definitely increased measurements in general. He is continuing to have discomfort as well he writes this to be a seven out of 10. In fact he would prefer me not  to perform any debridement today due to the fact that he is having discomfort and considering he has an active infection on the little reluctant to do so anyway. No fevers, chills, nausea, or vomiting noted at this time. 01/08/17; patient seems dermatology on September 5. I suspect dermatology will want the slides from the biopsy I did sent to their pathologist. I'm not sure if there is a way we can expedite that. In any case the culture I did before I left on vacation 3 weeks ago showed Pseudomonas he was given 10 days of Cipro and per her description of her intake nurses is actually somewhat better this week although the wound is quite a bit bigger than I remember the last time I saw this. He still has 3 more days of Cipro 01/21/17; dermatology appointment tomorrow. He has completed the ciprofloxacin for Pseudomonas. Surface of the wound looks better however he is had some deterioration in the lesions on his right leg. Meantime the left lateral leg wound we will continue with sample 01/29/17; patient had his dermatology appointment but I can't yet see that note. He is completed his antibiotics. The wound is  more superficial but considerably larger in circumferential area than when he came in. This is in his left lateral calf. He also has swollen erythematous areas with superficial wounds on the right leg and small papular areas on both arms. There apparently areas in her his upper thighs and buttocks I did not look at those. Dermatology biopsied the right leg. Hopefully will have their input next week. 02/05/17; patient went back to see his dermatologist who told him that he had a "scratching problem" as well as staph. He is now on a 30 day course of doxycycline and I believe she gave him triamcinolone cream to the right leg areas to help with the itching [not exactly sure but probably triamcinolone]. She apparently looked at the left lateral leg wound although this was not rebiopsied and I think felt to  be ultimately part of the same pathogenesis. He is using sample border foam and changing nevus himself. He now has a new open area on the right posterior leg which was his biopsy site I don't have any of the dermatology notes 02/12/17; we put the patient in compression last week with SANTYL to the wound on the left leg and the biopsy. Edema is much better and the depth of the wound is now at level of skin. Area is still the same Biopsy site on the right lateral leg we've also been using santyl with a border foam dressing and he is changing this himself. 02/19/17; Using silver alginate started last week to both the substantial left leg wound and the biopsy site on the right wound. He is tolerating compression well. Has a an appointment with his primary M.D. tomorrow wondering about diuretics although I'm wondering if the edema problem is actually lymphedema NYLAN, NEVEL ([id number]) 02/26/17; the patient has been to see his primary doctor Dr. Jerrel Ivory at [facility] our primary care. She started him on Lasix 20 mg and this seems to have helped with the edema. However we are not making substantial change with the left lateral calf wound and inflammation. The biopsy site on the right leg also looks stable but not really all that different. 03/12/17; the patient has been to see vein and vascular Dr. Lucky Cowboy. He has had venous reflux studies I have not reviewed these. I did get a call from his dermatology office. 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 They therefore recommended clobetasol and tetracycline i.e. 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 03/26/17; apparently the patient just had reflux studies not an appointment  with Dr. dew. She arrives in clinic today having applied clobetasol for 2-3 weeks. He notes over the last 2-3 days excessive drainage having to change the dressing 3-4 times a day and also expanding erythema. 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 Lillia Carmel at [facility] dermatology locally who kindly saw him at my request. 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 My concern was the degree  of erythema medially extending from his left leg wound which was either inflammation from pyoderma or cellulitis. I put him on Augmentin however culture of the wound showed Pseudomonas which is quinolone sensitive. I really don't believe he has cellulitis however in view of everything I will continue and give him a course of Cipro. He is also on doxycycline as an immune modulator for the pyoderma. In addition to his original wound on the left lateral leg with surrounding erythema he has a wound on the right posterior calf which was an original biopsy site done by dermatology. 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 Saw Dr. Lillia Carmel yesterday. He has been using topical antibiotics to both wound areas his original wound on the left and the biopsies/pathergy area on the right. There is definitely some improvement in the inflammation around the wound on the right although the patient states he has increasing sensitivity of the wounds. He is on prednisone 60 and doxycycline 1 as prescribed by Dr. Lillia Carmel. He is covering the topical antibiotic with gauze and putting this in his own compression stocks and changing this daily. He states that Dr.  Lottie Rater did a culture of the left leg wound yesterday 05/07/17; pyoderma gangrenosum. The patient saw Dr. Lillia Carmel yesterday and has a follow-up with her in one month. He is still using topical antibiotics to both wounds although he can't recall exactly what type. He is still on prednisone 60 mg. Dr. Lillia Carmel stated that the doxycycline could stop if we were in agreement. 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 The right medial leg was induced by biopsy/pathergy. The area on the right is essentially healed. 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 The right medial leg has closed and remains closed. He is still on prednisone 60. He tells me he missed his last dermatology appointment with Dr. Lillia Carmel but will make another appointment. He reports that her blood sugar at a recent screen in [place] was high 200's. He was 180 today. 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 I think he is going to require still much longer prednisone perhaps another 3 months before attempting to taper. In the meantime his wound is a lot better. Smaller. He is cleaning this off daily and applying topical antibiotics. When he was last in the clinic I thought about changing to [facility] and actually put in a couple of calls to dermatology although probably not during their business hours. 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 He continues on oral prednisone. He has been using triple antibiotic ointment. There is surface debris and we will transition to [facility] and have him return in 2 weeks. He has lost 30 pounds since his last appointment with lifestyle modification. He may  benefit from topical steroid cream for treatment this can be considered at a later date. 08/22/17 on evaluation today patient appears to actually be doing rather well in regard to his left lateral lower extremity ulcer. He has actually been managed by Dr. Dellia Nims most recently. Patient is currently on oral steroids at this time. This seems to have been of benefit for him. Nonetheless his last visit  was actually with Leah on 08/06/17. Currently he is not utilizing any topical steroid creams although this could be of benefit as well. No fevers, chills, nausea, or vomiting noted at this time. 09/05/17 on evaluation today patient appears to be doing better in regard to his left lateral lower extremity ulcer. He has been tolerating the dressing changes without complication. He is using Santyl with good effect. Overall I'm very pleased with how things are standing at this point. Patient likewise is happy that this is doing better. 09/19/17 on evaluation today patient actually appears to be doing rather well in regard to his left lateral lower extremity ulcer. 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 He's not having any significant pain. 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 He has been tolerating the Santyl without complication and in general I feel like he's making good progress. 10/17/17 on evaluation today patient appears to be doing very well in regard to his left lateral lower surety ulcer. He has been tolerating the dressing changes without complication. There does not appear to be any evidence of infection he's alternating the Santyl and the triple antibiotic ointment every other day this seems to be doing well for him. 11/03/17 on evaluation today patient appears to be doing very well in regard to his left lateral  lower extremity ulcer. He is been tolerating the dressing changes without complication which is good news. Fortunately there does not appear to be any evidence of infection which is also great news. Overall is doing excellent they are starting to taper down on the prednisone is down to 40 mg at this point it also started topical clobetasol for him. 11/17/17 on evaluation today patient appears to be doing well in regard to his left lateral lower surety ulcer. He's been tolerating the dressing changes without complication. He does note that he is having no pain, no excessive drainage or discharge, and overall he feels like things are going about how he would expect and hope they would. Overall he seems to have no evidence of infection at this time in my opinion which is good news. 12/04/17-He is seen in follow-up evaluation for right lateral lower extremity ulcer. He has been applying topical steroid cream. Today's measurement show slight increase in size. Over the next 2 weeks we will transition to every other day Santyl and steroid cream. He has been encouraged to monitor for changes and notify clinic with any concerns 12/15/17 on evaluation today patient's left lateral motion the ulcer and fortunately is doing worse again at this point. This just since last week to this week has close to doubled in size according to the patient. I did not seeing last week's I do not have a visual to compare this to in our system was also down so we do not have all the charts and at this point. Nonetheless it does have me somewhat concerned in regard to the fact that again he was worried enough about it he has contact the dermatology that placed them back on the full strength, 50 mg a day of the prednisone that he was taken previous. He continues to alternate using clobetasol along with Santyl at this point. He is obviously somewhat frustrated. RAED, SCHALK ([id number]) 12/22/17 on evaluation today patient appears to be  doing a little worse compared to  last evaluation. Unfortunately the wound is a little deeper and slightly larger than the last week's evaluation. With that being said he has made some progress in regard to the irritation surrounding at this time unfortunately despite that progress that's been made he still has a significant issue going on here. 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 For that reason I discussed with him today the possibility of perform a wound culture to ensure there's nothing overtly infected. 01/06/18 on evaluation today patient's wound is larger and deeper than previously evaluated. With that being said it did appear that his wound was infected after my last evaluation with him. Subsequently I did end up prescribing a prescription for Bactrim DS which she has been taking and having no complication with. Fortunately there does not appear to be any evidence of infection at this point in time as far as anything spreading, no want to touch, and overall I feel like things are showing signs of improvement. 01/13/18 on evaluation today patient appears to be even a little larger and deeper than last time. There still muscle exposed in the base of the wound. Nonetheless he does appear to be less erythematous I do believe inflammation is calming down also believe the infection looks like it's probably resolved at this time based on what I'm seeing. No fevers, chills, nausea, or vomiting noted at this time. 01/30/18 on evaluation today patient actually appears to visually look better for the most part. Unfortunately those visually this looks better he does seem to potentially have what may be an abscess in the muscle that has been noted in the central portion of the wound. This is the first time that I have noted what appears to be fluctuance  in the central portion of the muscle. With that being said I'm somewhat more concerned about the fact that this might indicate an abscess formation at this location. I do believe that an ultrasound would be appropriate. This is likely something we need to try to do as soon as possible. He has been switch to mupirocin ointment and he is no longer using the steroid ointment as prescribed by dermatology he sees them again next week he's been decreased from 60 to 40 mg of prednisone. 03/09/18 on evaluation today patient actually appears to be doing a little better compared to last time I saw him. There's not as much erythema surrounding the wound itself. He I did review his most recent infectious disease note which was dated 02/24/18. He saw Dr. Michel Bickers in [place]. 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 Patient is now off of antibiotics and they are continually observing this. There seems to be no change in the past two weeks in my pinion based on what the patient says and what I see today compared to what Dr. Megan Salon likely saw two weeks ago. No fevers, chills, nausea, or vomiting noted at this time. 03/23/18 on evaluation today patient's wound actually appears to be showing signs of improvement which is good news. He is currently still on the Dapsone. He is also working on tapering the prednisone to get off of this and Dr. Lottie Rater is working with him in this regard. Nonetheless overall I feel like the wound is doing well it does appear  based on the infectious disease note that I reviewed from Dr. 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 I did also review the results of his ultrasound of left lower extremity which revealed there was a dentist tissue in the base of the wound without an  abscess noted. 04/06/18 on evaluation today the patient's left lateral lower extremity ulcer actually appears to be doing fairly well which is excellent news. There does not appear to be any evidence of infection at this time which is also great news. Overall he still does have a significantly large ulceration although little by little he seems to be making progress. He is down to 10 mg a day of the prednisone. 04/20/18 on evaluation today patient actually appears to be doing excellent at this time in regard to his left lower extremity ulcer. He's making signs of good progress unfortunately this is taking much longer than we would really like to see but nonetheless he is making progress. Fortunately there does not appear to be any evidence of infection at this time. No fevers, chills, nausea, or vomiting noted at this time. The patient has not been using the Santyl due to the cost he hadn't got in this field yet. He's mainly been using the antibiotic ointment topically. Subsequently he also tells me that he really has not been scrubbing in the shower I think this would be helpful again as I told him it doesn't have to be anything too aggressive to even make it believe just enough to keep it free of some of the loose slough and biofilm on the wound surface. 05/11/18 on evaluation today patient's wound appears to be making slow but sure progress in regard to the left lateral lower extremity ulcer. He is been tolerating the dressing changes without complication. Fortunately there does not appear to be any evidence of infection at this time. He is still just using triple antibiotic ointment along with clobetasol occasionally over the area. He never got the Santyl and really does not seem to intend to in my pinion. 06/01/18 on evaluation today patient appears to be doing a little better in regard to his left lateral lower extremity ulcer. He states that overall he does not feel like he is doing as well with  the Dapsone as he did with the prednisone. Nonetheless he sees his dermatologist later today and is gonna talk to them about the possibility of going back on the prednisone. Overall again I believe that the wound would be better if you would utilize Santyl but he really does not seem to be interested in going back to the [place] at this point. He has been using triple antibiotic ointment. 06/15/18 on evaluation today patient's wound actually appears to be doing about the same at this point. Fortunately there is no signs of infection at this time. He has made slight improvements although he continues to not really want to clean the wound bed at this point. He states that he just doesn't mess with it he doesn't want to cause any problems with everything else he has going on. He has been on medication, antibiotics as prescribed by his dermatologist, for a staff infection of his lower extremities which is really drying out now and looking much better he tells me. Fortunately there is no sign of overall infection. 06/29/18 on evaluation today patient appears to be doing well in  regard to his left lateral lower surety ulcer all things considering. Fortunately his staff infection seems to be greatly improved compared to previous. He has no signs of infection and this is drying up quite nicely. He is still the doxycycline for this is no longer on cental, Dapsone, or any of the other medications. His dermatologist has recommended possibility of an infusion but right now he does not want to proceed with that. 07/13/18 on evaluation today patient appears to be doing about the same in regard to his left lateral lower surety ulcer. Fortunately there's no signs of infection at this time which is great news. Unfortunately he still builds up a significant amount of Slough/biofilm of the surface of the wound he still is not really cleaning this as he should be appropriately. Again I'm able to easily with saline and gauze  remove the majority of this on the surface which if you would do this at home would likely be a dramatic improvement for him as far as getting the area to improve. Nonetheless overall I still feel like he is making progress is just very slow. I think Santyl will be of benefit for him as well. Still he has not gotten this as of this point. 07/27/18 on evaluation today patient actually appears to be doing little worse in regards of the erythema around the periwound region of the wound he also tells me that he's been having more drainage currently compared to what he was experiencing last time I saw him. He states not quite as bad as what he had because this was infected previously but nonetheless is still appears to be doing poorly. Fortunately there is no evidence of systemic infection at this point. The patient tells me that he is not going to be able to afford the Santyl. He is still waiting to hear about the infusion therapy with his dermatologist. Apparently she wants an updated colonoscopy first. KEVAUGHN, EWING ([id number]) 08/10/18 on evaluation today patient appears to be doing better in regard to his left lateral lower extremity ulcer. Fortunately he is showing signs of improvement in this regard he's actually been approved for Remicade infusion's as well although this has not been scheduled as of yet. Fortunately there's no signs of active infection at this time in regard to the wound although he is having some issues with infection of the right lower extremity is been seen as dermatologist for this. Fortunately they are definitely still working with him trying to keep things under control. 09/07/18 on evaluation today patient is actually doing rather well in regard to his left lateral lower extremity ulcer. He notes these actually having some hair grow back on his extremity which is something he has not seen in years. He also tells me that the pain is really not giving them any trouble at this  time which is also good news overall she is very pleased with the progress he's using a combination of the mupirocin along with the probate is all mixed. 09/21/18 on evaluation today patient actually appears to be doing fairly well all things considered in regard to his looks from the ulcer. He's been tolerating the dressing changes without complication. Fortunately there's no signs of active infection at this time which is good news he is still on all antibiotics or prevention of the staff infection. He has been on prednisone for time although he states it is gonna contact his dermatologist and see if she put them on a short course due to some  irritation that he has going on currently. Fortunately there's no evidence of any overall worsening this is going very slow I think cental would be something that would be helpful for him although he states that $50 for tube is quite expensive. He therefore is not willing to get that at this point. 10/06/18 on evaluation today patient actually appears to be doing decently well in regard to his left lateral leg ulcer. He's been tolerating the dressing changes without complication. Fortunately there's no signs of active infection at this time. Overall I'm actually rather pleased with the progress he's making although it's slow he doesn't show any signs of infection and he does seem to be making some improvement. I do believe that he may need a switch up and dressings to try to help this to heal more appropriately and quickly. 10/19/18 on evaluation today patient actually appears to be doing better in regard to his left lateral lower extremity ulcer. This is shown signs of having much less Slough buildup at this point due to the fact he has been using the [employer]. Obviously this is very good news. The overall size of the wound is not dramatically smaller but again the appearance is. 11/02/18 on evaluation today patient actually appears to be doing quite well in regard to  his lower [place] ulcer. A lot of the skin around the ulcer is actually somewhat irritating at this point this seems to be more due to the dressing causing irritation from the adhesive that anything else. Fortunately there is no signs of active infection at this time. 11/24/18 on evaluation today patient appears to be doing a little worse in regard to his overall appearance of his lower extremity ulcer. There's more erythema and warmth around the wound unfortunately. He is currently on doxycycline which he has been on for some time. With that being said I'm not sure that seems to be helping with what appears to possibly be an acute cellulitis with regard to his left lower extremity ulcer. No fevers, chills, nausea, or vomiting noted at this time. 12/08/18 on evaluation today patient's wounds actually appears to be doing significantly better compared to his last evaluation. He has been using Santyl along with alternating tripling about appointment as well as the steroid cream seems to be doing quite well and the wound is showing signs of improvement which is excellent news. Fortunately there's no evidence of infection and in fact his culture came back negative with only normal skin flora noted. 12/21/2018 upon evaluation today patient actually appears to be doing excellent with regard to his ulcer. This is actually the best that I have seen it since have been helping to take care of him. It is both smaller as well as less slough noted on the surface of the wound and seems to be showing signs of good improvement with new skin growing from the edges. He has been using just the triamcinolone he does wonder if he can get a refill of that ointment today. 01/04/2019 upon evaluation today patient actually appears to be doing well with regard to his left lateral lower extremity ulcer. With that being said it does not appear to be that he is doing quite as well as last time as far as progression is concerned. There  does not appear to be any signs of infection or significant irritation which is good news. With that being said I do believe that he may benefit from switching to a collagen based dressing based on how clean  The wound appears. 01/18/2019 on evaluation today patient actually appears to be doing well with regard to his wound on the left lower extremity. He is not made a lot of progress compared to where we were previous but nonetheless does seem to be doing okay at this time which is good news. There is no signs of active infection which is also good news. My only concern currently is I do wish we can get him into utilizing the collagen dressing his insurance would not pay for the supplies that we ordered although it appears that he may be able to order this through his supply company that he typically utilizes. This is [employer]. Nonetheless he did try to order it during the office visit today and it appears this did go through. We will see if he can get that it is a different brand but nonetheless he has collagen and I do think will be beneficial. 02/01/2019 on evaluation today patient actually appears to be doing a little worse today in regard to the overall size of his wounds. Fortunately there is no signs of active infection at this time. That is visually. Nonetheless when this is happened before it was due to infection. For that reason were somewhat concerned about that this time as well. 02/08/2019 on evaluation today patient unfortunately appears to be doing slightly worse with regard to his wound upon evaluation today. Is measuring a little deeper and a little larger unfortunately. I am not really sure exactly what is causing this to enlarge he actually did see his dermatologist she is going to see about initiating Humira for him. Subsequently she also did do steroid injections into the wound itself in the periphery. Nonetheless still nonetheless he seems to be getting a little bit larger he is  gone back to just using the steroid cream topically which I think is appropriate. I would say hold off on the collagen for the time being is definitely a good thing to do. Based on the culture results which we finally did get the final result back regarding it shows staph as the bacteria noted again that can be a normal skin bacteria based on the fact however he is having increased drainage and worsening of the wound measurement wise I would go ahead and place him on an antibiotic today I do believe for this. 02/15/2019 on evaluation today patient actually appears to be doing somewhat better in regard to his ulcer. There is no signs of worsening at this time I did review his culture results which showed evidence of Staphylococcus aureus but not MRSA. Again this could just be more related to the normal skin bacteria although he states the drainage has slowed down quite a bit he may have had a mild infection not just colonization. And was much smaller and then since around10/04/2019 on evaluation today patient appears to be doing unfortunately worse as far as the size of the wound. I really feel like that this is steadily getting larger again it had been doing excellent right at the beginning of September we have seen a steady increase in the area of the wound it is almost 2-1/2 times the size it was on September 1. Obviously this is a bad trend this is not wanting to see. For that reason we went back to using just the topical triamcinolone cream which does seem to help with inflammation. I checked him for bacteria by way of culture and nothing showed positive there. I am considering giving him a  short course of a tapering steroid [name], CAVALLARO ([id number]) today to see if that is can be beneficial for him. The patient is in agreement with giving that a try. 03/08/2019 on evaluation today patient appears to be doing very well in comparison to last evaluation with regard to his lower extremity  ulcer. This is showing signs of less inflammation and actually measuring slightly smaller compared to last time every other week over the past month and a half he has been measuring larger larger larger. Nonetheless I do believe that the issue has been inflammation the prednisone does seem to have been beneficial for him which is good news. No fevers, chills, nausea, vomiting, or diarrhea. 03/22/2019 on evaluation today patient appears to be doing about the same with regard to his leg ulcer. He has been tolerating the dressing changes without complication. With that being said the wound seems to be mostly arrested at its current size but really is not making any progress except for when we prescribed the prednisone. He did show some signs of dropping as far as the overall size of the wound during that interval week. Nonetheless this is something he is not on long-term at this point and unfortunately I think he is getting need either this or else the Humira which his dermatologist has discussed try to get approval for. With that being said he will be seeing his dermatologist on the 11th of this month that is November. 04/19/2019 on evaluation today patient appears to be doing really about the same the wound is measuring slightly larger compared to last time I saw him. He has not been into the office since November 2 due to the fact that he unfortunately had Covid as that his entire family. He tells me that it was rough but they did pull-through and he seems to be doing much better. Fortunately there is no signs of active infection at this time. No fevers, chills, nausea, vomiting, or diarrhea. 05/10/2019 on evaluation today patient unfortunately appears to be doing significantly worse as compared to last time I saw him. He does tell me that he has had his first dose of Humira and actually is scheduled to get the next one in the upcoming week. With that being said he tells me also that in the past  several days he has been having a lot of issues with green drainage she showed me a picture this is more blue-green in color. He is also been having issues with increased sloughy buildup and the wound does appear to be larger today. Obviously this is not the direction that we want everything to take based on the starting of his Humira. Nonetheless I think this is definitely a result of likely infection and to be honest I think this is probably Pseudomonas causing the infection based on what I am seeing. 05/24/2019 on evaluation today patient unfortunately appears to be doing significantly worse compared to his prior evaluation with me 2 weeks ago. I did review his culture results which showed that he does have Staph aureus as well as Pseudomonas noted on the culture. Nonetheless the Levaquin that I prescribed for him does not appear to have been appropriate and in fact he tells me he is no longer experiencing the green drainage and discharge that he had at the last visit. Fortunately there is no signs of active infection at this time which is good news although the wound has significantly worsened it in fact is much deeper than it was  previous. We have been utilizing up to this point triamcinolone ointment as the prescription topical of choice but at this time I really feel like that the wound is getting need to be packed in order to appropriately manage this due to the deeper nature of the wound. Therefore something along the lines of an alginate dressing may be more appropriate. 05/31/2019 upon inspection today patient's wound actually showed signs of doing poorly at this point. Unfortunately he just does not seem to be making any good progress despite what we have tried. He actually did go ahead and pick up the Cipro and start taking that as he was noticing more green drainage he had previously completed the Levaquin that I prescribed for him as well. Nonetheless he missed his appointment for  the seventh last week on Wednesday with the wound care center and [facility] where his dermatologist referred him. Obviously I do think a second opinion would be helpful at this point especially in light of the fact that the patient seems to be doing so poorly despite the fact that we have tried everything that I really know how at this point. The only thing that ever seems to have helped him in the past is when he was on high doses of continual steroids that did seem to make a difference for him. 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 06/07/2019 upon evaluation today patient unfortunately appears to be doing worse yet again with regard to his wound. In fact I am starting to question whether or not he may have a fluid pocket in the muscle at this point based on the bulging and the soft appearance to the central portion of the muscle area. There is not anything draining from the muscle itself at this time which is good news but nonetheless the wound is expanding. I am not really seeing any results of the Humira as far as overall wound progression based on what I am seeing at this point. The patient has been referred for second opinion with regard to his wound to the [facility] wound care center by his dermatologist which I definitely am not in opposition to. Unfortunately we tried multiple dressings in the past including collagen, alginate, and at one point even Hydrofera Blue. With that being said he is never really used it for any significant amount of time due to the fact that he often complains of pain associated with these dressings and then will go back to either using the Santyl which she has done intermittently or more frequently the triamcinolone. He is also using his own compression stockings. We have wrapped him in the past but again  that was something else that he really was not a big fan of. 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 He seemed to do much better when he was on the steroids although honestly I understand that the steroids are not necessarily the best medication to be on long-term obviously  06/14/2019 on evaluation today patient appears to be doing actually a little bit better with regard to the overall appearance with his leg. Unfortunately he does continue to have issues with what appears to be some fluid underneath the muscle although he did see the wound specialty center at [facility] last week their main goals were to see about infusion therapy in place of the Humira as they feel like that is not quite strong enough. They also recommended that we continue with the treatment otherwise as we are they felt like that was appropriate and they are okay with him continuing to follow-up here with [place] in that regard. With that being said they are also sending him to the vein specialist there to see about vein stripping and if that would be of benefit for him. Subsequently they also did not really address whether or not an ultrasound of the muscle area to see if there is anything that needs to be addressed here would be appropriate or not. For that reason I discussed this with him last week I think we may proceed down that road at this point. 06/21/2019 upon evaluation today patient's wound actually appears to be doing slightly better compared to previous evaluations. I do believe that he has made a difference with regard to the progression here with the use of oral steroids. Again in the past has been the only  thing that is really calm things down. He does tell me that from [facility] is gotten a good news from there that there are no further vein stripping that is necessary at this point. I do not have that available for review today although the patient did relay this to me. He also did obtain and have the ultrasound of the wound completed which I did sign off on today. It does appear that there is no fluid collection under the muscle this is likely then just edematous tissue in general. That is also good news. Overall I still believe the inflammation is the main issue here. He did inquire about the possibility of a wound VAC again with the muscle protruding like it is I am not really sure whether the wound VAC is necessarily ideal or not. That is something we will have to consider although I do believe he may need compression wrapping to try to help with edema control which could potentially be of benefit. 06/28/2019 on evaluation today patient appears to be doing slightly better measurement wise although this is not terribly smaller he least seems to be trending towards that direction. With that being said he still seems to have purulent drainage noted in the wound bed at this time. He has been on Levaquin followed by Cipro over the past month. Unfortunately he still seems to have some issues with active infection at this time. I did perform a culture last week in order to evaluate and see if indeed there was still anything going on. Subsequently the culture did come back Doffing, Corion ([id number]) showing Pseudomonas which is consistent with the drainage has been having which is blue-green in color. He also has had an odor that again was somewhat consistent with Pseudomonas as well. Long story short it appears that the culture showed an intermediate finding with regard to how well the Cipro will work for the Pseudomonas infection. Subsequently being that he does not seem to be clearing up and at best what we are  doing is just keeping this at [place] I think he may need to see infectious  disease to discuss IV antibiotic options. 07/05/2019 upon evaluation today patient appears to be doing okay in regard to his leg ulcer. He has been tolerating the dressing changes at this point without complication. Fortunately there is no signs of active infection at this time which is good news. No fevers, chills, nausea, vomiting, or diarrhea. With that being said he does have an appointment with infectious disease tomorrow and his primary care on Wednesday. Again the reason for the infectious disease referral was due to the fact that he did not seem to be fully resolving with the use of oral antibiotics and therefore we were thinking that IV antibiotic therapy may be necessary secondary to the fact that there was an intermediate finding for how effective the Cipro may be. Nonetheless again he has been having a lot of purulent and even green drainage. Fortunately right now that seems to have calmed down over the past week with the reinitiation of the oral antibiotic. Nonetheless we will see what Dr. Megan Salon has to say. 07/12/2019 upon evaluation today patient appears to be doing about the same at this point in regard to his left lower extremity ulcer. Fortunately there is no signs of active infection at this time which is good news I do believe the Levaquin has been beneficial I did review Dr. Hale Bogus note and to be honest I agree that the patient's leg does appear to be doing better currently. What we found in the past as he does not seem to really completely resolve he will stop the antibiotic and then subsequently things will revert back to having issues with blue-green drainage, increased pain, and overall worsening in general. Obviously that is the reason I sent him back to infectious disease. 07/19/2019 upon evaluation today patient appears to be doing roughly the same in size there is really no dramatic improvement. He  has started back on the Levaquin at this point and though he seems to be doing okay he did still have a lot of blue/green drainage noted on evaluation today unfortunately. I think that this is still indicative more likely of a Pseudomonas infection as previously noted and again he does see Dr. Megan Salon in just a couple of days. I do not know that were really able to effectively clear this with just oral antibiotics alone based on what I am seeing currently. Nonetheless we are still continue to try to manage as best we can with regard to the patient and his wound. I do think the wrap was helpful in decreasing the edema which is excellent news. No fevers, chills, nausea, vomiting, or diarrhea. 07/26/2019 upon evaluation today patient appears to be doing slightly better with regard to the overall appearance of the muscle there is no dark discoloration centrally. Fortunately there is no signs of active infection at this time. No fevers, chills, nausea, vomiting, or diarrhea. Patient's wound bed currently the patient did have an appointment with Dr. Megan Salon at infectious disease last week. With that being said Dr. Megan Salon the patient states was still somewhat hesitant about put him on any IV antibiotics he wanted [place] to repeat cultures today and then see where things go going forward. He does look like Dr. Megan Salon because of some improvement the patient did have with the Levaquin wanted [place] to see about repeating cultures. If it indeed grows the Pseudomonas again then he recommended a possibility of considering a PICC line placement and IV antibiotic therapy. He plans to see the patient back in 1 to 2  weeks. 08/02/2019 upon evaluation today patient appears to be doing poorly with regard to his left lower extremity. We did get the results of his culture back it shows that he is still showing evidence of Pseudomonas which is consistent with the purulent/blue-green drainage that he has currently. Subsequently  the culture also shows that he now is showing resistance to the oral fluoroquinolones which is unfortunate as that was really the only thing to treat the infection prior. I do believe that he is looking like this is going require IV antibiotic therapy to get this under control. Fortunately there is no signs of systemic infection at this time which is good news. The patient does see Dr. Megan Salon tomorrow. 08/09/2019 upon evaluation today patient appears to be doing better with regard to his left lower extremity ulcer in regard to the overall appearance. He is currently on IV antibiotic therapy. As ordered by Dr. Megan Salon. Currently the patient is on ceftazidime which she is going to take for the next 2 weeks and then follow-up for 4 to 5-week appointment with Dr. Megan Salon. The patient started this this past Friday symptoms have not for a total of 3 days currently in full. 08/16/2019 upon evaluation today patient's wound actually does show muscle in the base of the wound but in general does appear to be much better as far as the overall evidence of infection is concerned. In fact I feel like this is for the most part cleared up he still on the IV antibiotics he has not completed the full course yet but I think he is doing much better which is excellent news. 08/23/2019 upon evaluation today patient appears to be doing about the same with regard to his wound at this point. He tells me that he still has pain unfortunately. Fortunately there is no evidence of systemic infection at this time which is great news. There is significant muscle protrusion. 09/13/19 upon evaluation today patient appears to be doing about the same in regard to his leg unfortunately. He still has a lot of drainage coming from the ulceration there is still muscle exposed. With that being said the patient's last wound culture still showed an intermediate finding with regard to the Pseudomonas he still having the bluish/green drainage as  well. Overall I do not know that the wound has completely cleared of infection at this point. Fortunately there is no signs of active infection systemically at this point which is good news. 09/20/2019 upon evaluation today patient's wound actually appears to be doing about the same based on what I am seeing currently. I do not see any signs of systemic infection he still does have evidence of some local infection and drainage. He did see Dr. Megan Salon last week and Dr. Megan Salon states that he probably does need a different IV antibiotic although he does not want to put him on this until the patient begins the Remicade infusion which is actually scheduled for about 10 days out from today on 13 May. Following that time Dr. Megan Salon is good to see him back and then will evaluate the feasibility of starting him on the IV antibiotic therapy once again at that point. I do not disagree with this plan I do believe as Dr. Megan Salon stated in his note that I reviewed today that the patient's issue is multifactorial with the pyoderma being 1 aspect of this that were hoping the Remicade will be helpful for her. In the meantime I think the gentamicin is, helping to keep things  under decent okay control in regard to the ulcer. 09/27/2019 upon evaluation today patient appears to be doing about the same with regard to his wound still there is a lot of muscle exposure though he does have some hyper granulation tissue noted around the edge and actually some granulation tissue starting to form over the muscle which is actually good news. Fortunately there is no evidence of active infection which is also good news. His pain is less at this point. 5/21; this is a patient I have not seen in a long time. 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 He has a large wound on the left lateral leg with protruding muscle. He comes in the clinic today showing the same area on his left medial ankle. He  says there is been a spot there for some time although we have not previously defined this. Today he has a clearly defined area with slight amount of skin breakdown surrounded by raised areas with a purplish hue in color. This is not painful he says it is irritated. 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 He still has muscle protruding from the lateral portion of his left leg but fortunately the new area that they were concerned about at his last visit does not appear to have opened at this point. He is currently on Remicade infusions and seems to be doing better in my opinion in fact the wound itself seems to be overall much better. The purplish discoloration that he did have seems to have resolved and I think that is a good sign that hopefully the Remicade is doing its job. He does [place], Hope ([id number]) have some biofilm noted over the surface of the wound. 11/01/2019 on evaluation today patient's wound actually appears to be doing excellent at this time. Fortunately there is no evidence of active infection and overall I feel like he is making great progress. The Remicade seems to be due excellent job in my opinion. 11/08/19 evaluation today vision actually appears to be doing quite well with regard to his weight ulcer. He's been tolerating dressing changes without complication. Fortunately there is no evidence of infection. No fevers, chills, nausea, or vomiting noted at this time. Overall states that is having more itching than pain which is actually a good sign in my opinion. 12/13/2019 upon evaluation today patient appears to be doing well today with regard to his wound. He has been tolerating the dressing changes without complication. Fortunately there is no sign of active infection at this time. No fevers, chills, nausea, vomiting, or diarrhea. Overall I feel like the infusion  therapy has been very beneficial for him. 01/06/2020 on evaluation today patient appears to be doing well with regard to his wound. This is measuring smaller and actually looks to be doing better. Fortunately there is no signs of active infection at this point. No fevers, chills, nausea, vomiting, or diarrhea. With that being said he does still have the blue-green drainage but this does not seem to be causing any significant issues currently. He has been using the gentamicin that does seem to be keeping things under decent control at this point. He goes later this morning for his next infusion therapy for the pyoderma which seems to also be very beneficial. 02/07/2020 on evaluation today patient appears to be doing about the same in regard to his wounds currently. Fortunately there is no signs  of active infection systemically he does still have evidence of local infection still using gentamicin. He also is showing some signs of improvement albeit slowly I do feel like we are making some progress here. 02/21/2020 upon evaluation today patient appears to be making some signs of improvement the wound is measuring a little bit smaller which is great news and overall I am very pleased with where he stands currently. He is going to be having infusion therapy treatment on the 15th of this month. Fortunately there is no signs of active infection at this time. 03/13/2020 I do believe patient's wound is actually showing some signs of improvement here which is great news. He has continue with the infusion therapy through rheumatology/dermatology at [facility]. That does seem to be beneficial. I still think he gets as much benefit from this as he did from the prednisone initially but nonetheless obviously this is less harsh on his body that the prednisone as far as they are concerned. 03/31/2020 on evaluation today patient's wound actually showing signs of some pretty good improvement in regard to the overall appearance  of the wound bed. There is still muscle exposed though he does have some epithelial growth around the edges of the wound. Fortunately there is no signs of active infection at this time. No fevers, chills, nausea, vomiting, or diarrhea. 04/24/2020 upon evaluation today patient appears to be doing about the same in regard to his leg ulcer. He has been tolerating the dressing changes without complication. Fortunately there is no signs of active infection at this time. No fevers, chills, nausea, vomiting, or diarrhea. With that being said he still has a lot of irritation from the bandaging around the edges of the wound. We did discuss today the possibility of a referral to plastic surgery. Objective Constitutional Well-nourished and well-hydrated in no acute distress. Vitals Time Taken: 8:08 AM, Height: 71 in, Weight: 338 lbs, BMI: 47.1, Temperature: 98.7 F, Pulse: 80 bpm, Respiratory Rate: 16 breaths/min, Blood Pressure: 125/76 mmHg. Respiratory normal breathing without difficulty. Psychiatric this patient is able to make decisions and demonstrates good insight into disease process. Alert and Oriented x 3. pleasant and cooperative. General Notes: Upon inspection patient's wound bed actually showed signs of good granulation at this time. There does appear to still be muscle noted in the base of the wound he does have some somewhat rolled edges and to be honest though I do feel like he is making progress here this is extremely slow. I do believe a consult with plastic surgery could be warranted and helpful for him. I am also going to help him with smoking cessation we did have a discussion about that today around 5 minutes in length. I also did subsequently prescribe him Chantix and the starter pack. He has used this before with good result he tells me. Integumentary (Hair, Skin) Wound #1 status is Open. Original cause of wound was Gradually Appeared. The wound is located on the Left,Lateral Lower  Leg. The wound measures 6.4cm length x 6.2cm width x 0.3cm depth; 31.165cm^2 area and 9.349cm^3 volume. There is muscle and Fat Layer (Subcutaneous Tissue) exposed. There is no tunneling or undermining noted. There is a large amount of purulent drainage noted. The wound margin is epibole. There is medium (34-66%) red granulation within the wound bed. There is a small (1-33%) amount of necrotic tissue within the wound bed including Adherent Slough. [name], [name] ([id number]) Assessment Active Problems ICD-10 Non-pressure chronic ulcer of left calf with fat layer exposed Pyoderma  gangrenosum Non-pressure chronic ulcer of left ankle limited to breakdown of skin Venous insufficiency (chronic) (peripheral) Cellulitis of left lower limb Nicotine dependence, unspecified, with other nicotine-induced disorders Plan Wound Cleansing: Wound #1 Left,Lateral Lower Leg: Clean wound with Normal Saline. Anesthetic (add to Medication List): Wound #1 Left,Lateral Lower Leg: Topical Lidocaine 4% cream applied to wound bed prior to debridement (In Clinic Only). Primary Wound Dressing: Gentamicin Sulfate Cream Wound #1 Left,Lateral Lower Leg: Collagen - Patient wants to order collagen powder to wound. Secondary Dressing: Wound #1 Left,Lateral Lower Leg: Boardered Foam Dressing Dressing Change Frequency: Wound #1 Left,Lateral Lower Leg: Change dressing every day. Follow-up Appointments: Wound #1 Left,Lateral Lower Leg: Return Appointment in 3 weeks. Edema Control: Wound #1 Left,Lateral Lower Leg: Patient to wear own compression stockings Elevate legs to the level of the heart and pump ankles as often as possible Consults ordered were: Plastic Surgery - Referral for skin graft at [facility]. The following medication(s) was prescribed: Chantix Starting Month Box oral 0.5 mg (11) 1 mg (42)- 1 mg (42) tablets,dose pack 0 tablets,dose pack oral for starting month. Take per dose pack instructions starting  04/24/2020 1. Would recommend currently that we go ahead and continue with the mixture of gentamicin and triamcinolone. The patient is going to start putting some collagen powder over this as well to try to help with new tissue growth. He has not liked the collagen that we have here in the clinic in the past he states he feels like it gets too dry. Either way he does want to try the powder unfortunately he has taken the initiative to order this. 2. I am also get a recommend that we continue with the daily dressing changes he has been performing. I think this is still a good way to go as far as the overall wound management is concerned. 3. I would like to see about getting him into plastic surgery at [facility] since he does see dermatology there in order to see if there is anything they could do such as a split-thickness skin graft. With that being said I discussed with the patient that if they were to consider this he would need to stop smoking. He states he used Chantix in the past and therefore I am going to go ahead and prescribe this for him today as well we did have a 5-minute or so discussion on smoking cessation he is willing to stop. We will see patient back for reevaluation in 3 weeks here in the clinic. If anything worsens or changes patient will contact our office for additional recommendations. Electronic Signature(s) Signed: 04/24/2020 10:00:42 AM By: Worthy Keeler PA-C Entered By: Worthy Keeler on 04/24/2020 10:00:41 [name] ([id number]) -------------------------------------------------------------------------------- SuperBill Details Patient Name: [name] Date of Service: 04/24/2020 Medical Record Number: [id number] Patient Account Number: [id number] Date of Birth/Sex: 1980-12-26 (39 y.o. 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 I87.2 Venous insufficiency (chronic) (peripheral) L03.116 Cellulitis of left lower limb F17.208 Nicotine dependence, unspecified, with other nicotine-induced disorders Facility Procedures CPT4 Code: [id number] Description: 99213 - WOUND CARE VISIT-LEV 3 EST PT Modifier: Quantity: 1 CPT4 Code: 95638756 Description: 99406-SMOKING CESSATION 3-10MINS Modifier: Quantity: 1 CPT4 Code: Description: ICD-10 Diagnosis Description F17.208 Nicotine  dependence, unspecified, with other nicotine-induced disorders Modifier: Quantity: Physician Procedures CPT4 Code: [id number] Description: 55831 - WC PHYS LEVEL 4 - EST PT Modifier: 25 Quantity: 1 CPT4 Code: Description: ICD-10 Diagnosis Description L97.222 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

## 2020-04-25 NOTE — Progress Notes (Signed)
[name], [name] ([id number]) Visit Report for 04/24/2020 Arrival Information Details Patient Name: [name], [name] Date of Service: 04/24/2020 8:00 AM Medical Record Number: [id number] Patient Account Number: [id number] Date of Birth/Sex: 1980/09/27 (39 y.o. M) Treating RN: Dolan Amen Primary Care Orvill Coulthard: [name] Other Clinician: Referring Deryn Massengale: [name] Treating Ayda Tancredi/Extender: [name] Cliche in Treatment: 176 Visit Information History Since Last Visit Pain Present Now: No Patient Arrived: Ambulatory Arrival Time: 08:07 Accompanied By: self Transfer Assistance: None Patient Requires Transmission-Based No Precautions: Patient Has Alerts: Yes Patient Alerts: 08/09/19 Picc Line Right Electronic Signature(s) Signed: 04/24/2020 2:10:08 PM By: Georges Mouse, Minus Breeding Entered By: Georges Mouse, Minus Breeding on 04/24/2020 08:08:03 [name] ([id number]) -------------------------------------------------------------------------------- Clinic Level of Care Assessment Details Patient Name: [name] Date of Service: 04/24/2020 8:00 AM Medical Record Number: [id number] Patient Account Number: [id number] Date of Birth/Sex: 02/24/1981 (39 y.o. M) Treating RN: [name] Primary Care Kamorah Nevils: [name] Other Clinician: Referring Tyrae Alcoser: [name] Treating Janylah Belgrave/Extender: [name] Cliche in Treatment: 176 Clinic Level of Care Assessment Items TOOL 4 Quantity Score []  - Use when only an EandM is performed on FOLLOW-UP visit 0 ASSESSMENTS - Nursing Assessment / Reassessment X - Reassessment of Co-morbidities (includes updates in patient status) 1 10 X- 1 5 Reassessment of Adherence to Treatment Plan ASSESSMENTS - Wound and Skin Assessment / Reassessment X - Simple Wound Assessment / Reassessment - one wound 1 5 []  - 0 Complex Wound Assessment / Reassessment - multiple wounds []  - 0 Dermatologic / Skin Assessment (not related to wound  area) ASSESSMENTS - Focused Assessment []  - Circumferential Edema Measurements - multi extremities 0 []  - 0 Nutritional Assessment / Counseling / Intervention []  - 0 Lower Extremity Assessment (monofilament, tuning fork, pulses) []  - 0 Peripheral Arterial Disease Assessment (using hand held doppler) ASSESSMENTS - Ostomy and/or Continence Assessment and Care []  - Incontinence Assessment and Management 0 []  - 0 Ostomy Care Assessment and Management (repouching, etc.) PROCESS - Coordination of Care X - Simple Patient / Family Education for ongoing care 1 15 []  - 0 Complex (extensive) Patient / Family Education for ongoing care X- 1 10 Staff obtains Consents, Records, Test Results / Process Orders []  - 0 Staff telephones HHA, Nursing Homes / Clarify orders / etc []  - 0 Routine Transfer to another Facility (non-emergent condition) []  - 0 Routine Hospital Admission (non-emergent condition) []  - 0 New Admissions / Biomedical engineer / Ordering NPWT, Apligraf, etc. []  - 0 Emergency Hospital Admission (emergent condition) X- 1 10 Simple Discharge Coordination []  - 0 Complex (extensive) Discharge Coordination PROCESS - Special Needs []  - Pediatric / Minor Patient Management 0 []  - 0 Isolation Patient Management []  - 0 Hearing / Language / Visual special needs []  - 0 Assessment of Community assistance (transportation, D/C planning, etc.) []  - 0 Additional assistance / Altered mentation []  - 0 Support Surface(s) Assessment (bed, cushion, seat, etc.) INTERVENTIONS - Wound Cleansing / Measurement [name], [name] ([id number]) X- 1 5 Simple Wound Cleansing - one wound []  - 0 Complex Wound Cleansing - multiple wounds X- 1 5 Wound Imaging (photographs - any number of wounds) []  - 0 Wound Tracing (instead of photographs) X- 1 5 Simple Wound Measurement - one wound []  - 0 Complex Wound Measurement - multiple wounds INTERVENTIONS - Wound Dressings []  - Small Wound Dressing  one or multiple wounds 0 X- 1 15 Medium Wound Dressing one or multiple wounds []  - 0 Large Wound Dressing one or multiple wounds []  - 0 Application of  Medications - topical []  - 0 Application of Medications - injection INTERVENTIONS - Miscellaneous []  - External ear exam 0 []  - 0 Specimen Collection (cultures, biopsies, blood, body fluids, etc.) []  - 0 Specimen(s) / Culture(s) sent or taken to Lab for analysis []  - 0 Patient Transfer (multiple staff / [name] / Similar devices) []  - 0 Simple Staple / Suture removal (25 or less) []  - 0 Complex Staple / Suture removal (26 or more) []  - 0 Hypo / Hyperglycemic Management (close monitor of Blood Glucose) []  - 0 Ankle / Brachial Index (ABI) - do not check if billed separately X- 1 5 Vital Signs Has the patient been seen at the hospital within the last three years: Yes Total Score: 90 Level Of Care: New/Established - Level 3 Electronic Signature(s) Signed: 04/25/2020 5:20:48 PM By: [name], [name], RN, [name], [name], [name] Entered By: [name], [name], RN, [name], [name] on 04/24/2020 08:34:40 [name] ([id number]) -------------------------------------------------------------------------------- Encounter Discharge Information Details Patient Name: [name] Date of Service: 04/24/2020 8:00 AM Medical Record Number: [id number] Patient Account Number: [id number] Date of Birth/Sex: 28-Feb-1981 (39 y.o. M) Treating RN: [name] Primary Care Kamani Magnussen: [name] Other Clinician: Referring Karaline Buresh: [name] Treating Freddrick Gladson/Extender: [name] Cliche in Treatment: 176 Encounter Discharge Information Items Discharge Condition: Stable Ambulatory Status: Ambulatory Discharge Destination: Home Transportation: Private Auto Accompanied By: self Schedule Follow-up Appointment: Yes Clinical Summary of Care: Electronic Signature(s) Signed: 04/25/2020 5:20:48 PM By: [name], [name], RN, [name], [name], [name] Entered By: [name], [name], RN, [name],  [name] on 04/24/2020 08:40:39 [name] ([id number]) -------------------------------------------------------------------------------- General Visit Notes Details Patient Name: [name] Date of Service: 04/24/2020 8:00 AM Medical Record Number: [id number] Patient Account Number: [id number] Date of Birth/Sex: 1980-11-12 (39 y.o. M) Treating RN: [name] Primary Care Sevannah Madia: [name] Other Clinician: Referring Rhina Kramme: [name] Treating Cristin Szatkowski/Extender: [name] Cliche in Treatment: 176 Notes PA discussed making a referral to [place] for a skin graft for patient. [facility] requires patient to be nicotine free for 2 months before being considered for surgery. PA prescribed Chantix for patient. We will follow patient and make the referral when appropriate. Electronic Signature(s) Signed: 04/24/2020 5:25:11 PM By: [name], [name], RN, [name], [name], [name] Previous Signature: 04/24/2020 5:24:35 PM Version By: [name], [name], RN, [name], [name], [name] Entered By: [name], [name], RN, [name], [name] on 04/24/2020 17:25:11 [name], [name] ([id number]) -------------------------------------------------------------------------------- Lower Extremity Assessment Details Patient Name: [name] Date of Service: 04/24/2020 8:00 AM Medical Record Number: [id number] Patient Account Number: [id number] Date of Birth/Sex: January 17, 1981 (39 y.o. M) Treating RN: Dolan Amen Primary Care Nilesh Stegall: [name] Other Clinician: Referring Yaire Kreher: [name] Treating Shay Bartoli/Extender: Jeri Cos Weeks in Treatment: 176 Edema Assessment Assessed: [Left: Yes] [Right: No] Edema: [Left: Ye] [Right: s] Vascular Assessment Pulses: Dorsalis Pedis Palpable: [Left:Yes] Electronic Signature(s) Signed: 04/24/2020 2:10:08 PM By: Georges Mouse, Minus Breeding Entered By: Georges Mouse, Minus Breeding on 04/24/2020 08:19:30 [name]  ([id number]) -------------------------------------------------------------------------------- Multi Wound Chart Details Patient Name: [name] Date of Service: 04/24/2020 8:00 AM Medical Record Number: [id number] Patient Account Number: [id number] Date of Birth/Sex: 05-09-1981 (39 y.o. M) Treating RN: [name] Primary Care Aslan Himes: [name] Other Clinician: Referring Abilene Mcphee: [name] Treating Norma Ignasiak/Extender: [name] Cliche in Treatment: 176 Vital Signs Height(in): 71 Pulse(bpm): 80 Weight(lbs): 338 Blood Pressure(mmHg): 125/76 Body Mass Index(BMI): 47 Temperature(F): 98.7 Respiratory Rate(breaths/min): 16 Photos: [N/A:N/A] Wound Location: Left, Lateral Lower Leg N/A N/A Wounding Event: Gradually Appeared N/A N/A Primary Etiology: Pyoderma N/A N/A Comorbid History:  Sleep Apnea, Hypertension, Colitis N/A N/A Date Acquired: 11/18/2015 N/A N/A Weeks of Treatment: 176 N/A N/A Wound Status: Open N/A N/A Measurements L x W x D (cm) 6.4x6.2x0.3 N/A N/A Area (cm) : 31.165 N/A N/A Volume (cm) : 9.349 N/A N/A % Reduction in Area: -534.90% N/A N/A % Reduction in Volume: -138.10% N/A N/A Classification: Full Thickness With Exposed N/A N/A Support Structures Exudate Amount: Large N/A N/A Exudate Type: Purulent N/A N/A Exudate Color: yellow, brown, green N/A N/A Wound Margin: Epibole N/A N/A Granulation Amount: Medium (34-66%) N/A N/A Granulation Quality: Red N/A N/A Necrotic Amount: Small (1-33%) N/A N/A Exposed Structures: Fat Layer (Subcutaneous Tissue): N/A N/A Yes Muscle: Yes Fascia: No Tendon: No Joint: No Bone: No Epithelialization: None N/A N/A Treatment Notes Electronic Signature(s) Signed: 04/25/2020 5:20:48 PM By: [name], [name], RN, [name], [name], [name] Entered By: [name], [name], RN, [name], [name] on 04/24/2020 08:30:00 [name] ([id number]) -------------------------------------------------------------------------------- Multi-Disciplinary Care  Plan Details Patient Name: [name] Date of Service: 04/24/2020 8:00 AM Medical Record Number: [id number] Patient Account Number: [id number] Date of Birth/Sex: [birth year] (39 y.o. M) Treating RN: [name] Primary Care Gerrald Basu: [name] Other Clinician: Referring Calie Buttrey: [name] Treating Lillith Mcneff/Extender: [name] Cliche in Treatment: 176 Active Inactive Venous Leg Ulcer Nursing Diagnoses: Knowledge deficit related to disease process and management Potential for venous Insuffiency (use before diagnosis confirmed) Goals: Non-invasive venous studies are completed as ordered Date Initiated: 12/11/2016 Target Resolution Date: 03/13/2017 Goal Status: Active Patient will maintain optimal edema control Date Initiated: 12/11/2016 Target Resolution Date: 03/13/2017 Goal Status: Active Patient/caregiver will verbalize understanding of disease process and disease management Date Initiated: 12/11/2016 Target Resolution Date: 03/13/2017 Goal Status: Active Verify adequate tissue perfusion prior to therapeutic compression application Date Initiated: 12/11/2016 Target Resolution Date: 03/13/2017 Goal Status: Active Interventions: Assess peripheral edema status every visit. Compression as ordered Provide education on venous insufficiency Treatment Activities: Therapeutic compression applied : 12/11/2016 Notes: Wound/Skin Impairment Nursing Diagnoses: Impaired tissue integrity Knowledge deficit related to ulceration/compromised skin integrity Goals: Patient/caregiver will verbalize understanding of skin care regimen Date Initiated: 12/11/2016 Target Resolution Date: 03/13/2017 Goal Status: Active Ulcer/skin breakdown will have a volume reduction of 30% by week 4 Date Initiated: 12/11/2016 Target Resolution Date: 03/13/2017 Goal Status: Active Ulcer/skin breakdown will have a volume reduction of 50% by week 8 Date Initiated: 12/11/2016 Target Resolution Date:  03/13/2017 Goal Status: Active Ulcer/skin breakdown will have a volume reduction of 80% by week 12 Date Initiated: 12/11/2016 Target Resolution Date: 03/13/2017 Goal Status: Active Ulcer/skin breakdown will heal within 14 weeks Date Initiated: 12/11/2016 Target Resolution Date: 03/13/2017 Goal Status: Active [name], [name] ([id number]) Interventions: Assess patient/caregiver ability to obtain necessary supplies Assess patient/caregiver ability to perform ulcer/skin care regimen upon admission and as needed Assess ulceration(s) every visit Provide education on ulcer and skin care Treatment Activities: Skin care regimen initiated : 12/11/2016 Topical wound management initiated : 12/11/2016 Notes: Electronic Signature(s) Signed: 04/25/2020 5:20:48 PM By: [name], [name], RN, [name], [name], [name] Entered By: [name], [name], RN, [name], [name] on 04/24/2020 08:29:50 [name] ([id number]) -------------------------------------------------------------------------------- Pain Assessment Details Patient Name: [name] Date of Service: 04/24/2020 8:00 AM Medical Record Number: [id number] Patient Account Number: [id number] Date of Birth/Sex: [birth year] (39 y.o. M) Treating RN: Dolan Amen Primary Care Jaclyn Andy: [name] Other Clinician: Referring Mindee Robledo: [name] Treating Gracee Ratterree/Extender: [name] Cliche in Treatment: 176 Active Problems Location of Pain Severity and Description of Pain Patient Has Paino No Site Locations Rate the pain. Current Pain Level: 0  Pain Management and Medication Current Pain Management: Electronic Signature(s) Signed: 04/24/2020 2:10:08 PM By: Georges Mouse, Minus Breeding Entered By: Georges Mouse, Minus Breeding on 04/24/2020 08:10:02 [name] ([id number]) -------------------------------------------------------------------------------- Patient/Caregiver Education Details Patient Name: [name] Date of Service: 04/24/2020 8:00 AM Medical Record Number:  [id number] Patient Account Number: [id number] Date of Birth/Gender: 06-20-1980 (39 y.o. M) Treating RN: [name] Primary Care Physician: [name] Other Clinician: Referring Physician: [name] Treating Physician/Extender: [name] Cliche in Treatment: 176 Education Assessment Education Provided To: Patient Education Topics Provided Wound/Skin Impairment: Handouts: Caring for Your Ulcer Methods: Demonstration, Explain/Verbal Responses: State content correctly Electronic Signature(s) Signed: 04/25/2020 5:20:48 PM By: [name], [name], RN, [name], [name], [name] Entered By: [name], [name], RN, [name], [name] on 04/24/2020 08:35:04 [name] ([id number]) -------------------------------------------------------------------------------- Wound Assessment Details Patient Name: [name] Date of Service: 04/24/2020 8:00 AM Medical Record Number: [id number] Patient Account Number: [id number] Date of Birth/Sex: 08/31/1980 (39 y.o. M) Treating RN: Dolan Amen Primary Care Kamyia Thomason: [name] Other Clinician: Referring Dan Scearce: [name] Treating Ephrem Carrick/Extender: [name] Cliche in Treatment: 176 Wound Status Wound Number: 1 Primary Etiology: Pyoderma Wound Location: Left, Lateral Lower Leg Wound Status: Open Wounding Event: Gradually Appeared Comorbid History: Sleep Apnea, Hypertension, Colitis Date Acquired: 11/18/2015 Weeks Of Treatment: 176 Clustered Wound: No Photos Wound Measurements Length: (cm) 6.4 Width: (cm) 6.2 Depth: (cm) 0.3 Area: (cm) 31.165 Volume: (cm) 9.349 % Reduction in Area: -534.9% % Reduction in Volume: -138.1% Epithelialization: None Tunneling: No Undermining: No Wound Description Classification: Full Thickness With Exposed Support Structures Wound Margin: Epibole Exudate Amount: Large Exudate Type: Purulent Exudate Color: yellow, brown, green Foul Odor After Cleansing: No Slough/Fibrino Yes Wound Bed Granulation Amount: Medium  (34-66%) Exposed Structure Granulation Quality: Red Fascia Exposed: No Necrotic Amount: Small (1-33%) Fat Layer (Subcutaneous Tissue) Exposed: Yes Necrotic Quality: Adherent Slough Tendon Exposed: No Muscle Exposed: Yes Necrosis of Muscle: No Joint Exposed: No Bone Exposed: No Treatment Notes Wound #1 (Left, Lateral Lower Leg) Notes [name], [name] ([id number]) Electronic Signature(s) Signed: 04/24/2020 2:10:08 PM By: Georges Mouse, Minus Breeding Entered By: Georges Mouse, Minus Breeding on 04/24/2020 08:17:13 [name] ([id number]) -------------------------------------------------------------------------------- Vitals Details Patient Name: [name] Date of Service: 04/24/2020 8:00 AM Medical Record Number: [id number] Patient Account Number: [id number] Date of Birth/Sex: Jun 14, 1980 (39 y.o. M) Treating RN: Dolan Amen Primary Care Shuree Brossart: [name] Other Clinician: Referring Dua Mehler: [name] Treating Oluwatoyin Banales/Extender: [name] Cliche in Treatment: 176 Vital Signs Time Taken: 08:08 Temperature (F): 98.7 Height (in): 71 Pulse (bpm): 80 Weight (lbs): 338 Respiratory Rate (breaths/min): 16 Body Mass Index (BMI): 47.1 Blood Pressure (mmHg): 125/76 Reference Range: 80 - 120 mg / dl Electronic Signature(s) Signed: 04/24/2020 2:10:08 PM By: Georges Mouse, Minus Breeding Entered By: Georges Mouse, Minus Breeding on 04/24/2020 08:09:44

## 2020-04-28 NOTE — Unmapped (Signed)
[facility] 04/27/20

## 2020-05-04 NOTE — Unmapped (Signed)
This encounter was created in error - please disregard.

## 2020-05-22 ENCOUNTER — Encounter: Payer: 59 | Attending: Physician Assistant | Admitting: Physician Assistant

## 2020-05-22 ENCOUNTER — Other Ambulatory Visit: Payer: Self-pay

## 2020-05-22 DIAGNOSIS — T781XXA Other adverse food reactions, not elsewhere classified, initial encounter: Secondary | ICD-10-CM | POA: Insufficient documentation

## 2020-05-22 DIAGNOSIS — L280 Lichen simplex chronicus: Secondary | ICD-10-CM | POA: Insufficient documentation

## 2020-05-22 DIAGNOSIS — I872 Venous insufficiency (chronic) (peripheral): Secondary | ICD-10-CM | POA: Diagnosis not present

## 2020-05-22 DIAGNOSIS — L97222 Non-pressure chronic ulcer of left calf with fat layer exposed: Secondary | ICD-10-CM | POA: Insufficient documentation

## 2020-05-22 DIAGNOSIS — I10 Essential (primary) hypertension: Secondary | ICD-10-CM | POA: Diagnosis not present

## 2020-05-22 DIAGNOSIS — L88 Pyoderma gangrenosum: Secondary | ICD-10-CM | POA: Diagnosis not present

## 2020-05-22 DIAGNOSIS — G4730 Sleep apnea, unspecified: Secondary | ICD-10-CM | POA: Diagnosis not present

## 2020-05-22 DIAGNOSIS — K5190 Ulcerative colitis, unspecified, without complications: Secondary | ICD-10-CM | POA: Diagnosis not present

## 2020-05-22 DIAGNOSIS — L03116 Cellulitis of left lower limb: Secondary | ICD-10-CM | POA: Diagnosis not present

## 2020-05-22 DIAGNOSIS — L97321 Non-pressure chronic ulcer of left ankle limited to breakdown of skin: Secondary | ICD-10-CM | POA: Insufficient documentation

## 2020-05-22 NOTE — Progress Notes (Addendum)
JEDEDIAH, NODA ([id number]) Visit Report for 05/22/2020 Chief Complaint Document Details Patient Name: [name], [name] Date of Service: 05/22/2020 8:00 AM Medical Record Number: [id number] Patient Account Number: [id number] Date of Birth/Sex: [birth year] (40 y.o. 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 M) Treating RN: [name] Primary Care Provider: [name] Other Clinician: Referring Provider: [name] Treating Provider/Extender: [name] Cliche in Treatment: 180 History of Present Illness HPI Description: 12/04/16; 40 year old man who comes into the clinic today for review of a wound on the posterior left calf. He tells me that is been there for about a year. He is not a diabetic he does smoke half a pack per day. He was seen in the ER on 11/20/16 felt to have cellulitis around the wound and was given clindamycin. An x-ray did not show osteomyelitis. The patient initially tells me that he has a milk allergy that sets off a pruritic itching rash on his lower legs which she scratches incessantly and he thinks that's what may have set up the wound. He has been using various topical antibiotics and ointments without any effect. He works in a [employer] and is on his feet all day. He does not have a prior history of  wounds however he does have the rash on both lower legs the right arm and the ventral aspect of his left arm. These are excoriations and clearly have had scratching however there are of macular looking areas on both legs including a substantial larger area on the right leg. This does not have an underlying open area. There is no blistering. The patient tells me that 2 years ago in [place] in response to the rash on his legs he saw a dermatologist who told him he had a condition which may be pyoderma gangrenosum although I may be putting words into his mouth. He seemed to recognize this. 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 He is not in any treatment for this. He's had no recent travel 12/11/16; the patient arrives today with his wound and roughly the same condition we've been using silver alginate this is a deep punched out wound with some surrounding erythema but no tenderness. Biopsy I did did not show confirmed pyoderma gangrenosum suggested nonspecific inflammation and vasculitis but does not provide an actual description of what was seen by the pathologist. I'm really not able to understand this We have also received information from the patient's dermatologist in [place] notes from April 2016. This was a doctor Agarwal-antal. 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 He was prescribed topical steroid high potency under occlusion which helped but at this point the patient did not have a deep punched out wound. 12/18/16; the patient's wound is larger in terms of surface area however this surface looks better and there is less depth. The surrounding erythema also is better. The patient states that the wrap we put on came off 2 days ago when he has been using his compression stockings.  He we are in the process of getting a dermatology consult. 12/26/16 on evaluation today patient's left lower extremity wound shows evidence of infection with  surrounding erythema noted. He has been tolerating the dressing changes but states that he has noted more discomfort. There is a larger area of erythema surrounding the wound. No fevers, chills, nausea, or vomiting noted at this time. With that being said the wound still does have slough covering the surface. He is not allergic to any medication that he is aware of at this point. In regard to his right lower extremity he had several regions that are erythematous and pruritic he wonders if there's anything we can do to help that. 01/02/17 I reviewed patient's wound culture which was obtained his visit last week. He was placed on doxycycline at that point. Unfortunately that does not appear to be an antibiotic that would likely help with the situation however the pseudomonas noted on culture is sensitive to Cipro. Also unfortunately patient's wound seems to have a large compared to last week's evaluation. Not severely so but there are definitely increased measurements in general. He is continuing to have discomfort as well he writes this to be a seven out of 10. In fact he would prefer me not to perform any debridement today due to the fact that he is having discomfort and considering he has an active infection on the little reluctant to do so anyway. No fevers, chills, nausea, or vomiting noted at this time. 01/08/17; patient seems dermatology on September 5. I suspect dermatology will want the slides from the biopsy I did sent to their pathologist. I'm not sure if there is a way we can expedite that. In any case the culture I did before I left on vacation 3 weeks ago showed Pseudomonas he was given 10 days of Cipro and per her description of her intake nurses is actually somewhat better this week although the wound is quite a bit bigger than I remember the last time I saw this. He still has 3 more days of Cipro 01/21/17; dermatology appointment tomorrow. He has completed the ciprofloxacin for Pseudomonas.  Surface of the wound looks better however he is had some deterioration in the lesions on his right leg. Meantime the left lateral leg wound we will continue with sample 01/29/17; patient had his dermatology appointment but I can't yet see that note. He is completed his antibiotics. The wound is more superficial but considerably larger in circumferential area than when he came in. This is in his left lateral calf. He also has swollen erythematous areas with superficial wounds on the right leg and small papular areas on both arms. There apparently areas in her his upper thighs and buttocks I did not look at those. Dermatology biopsied the right leg. Hopefully will have their input next week. 02/05/17; patient went back to see his dermatologist who told him that he had a "scratching problem" as well as staph. He is now on a 30 day course of doxycycline and I believe she gave him triamcinolone cream to the right leg areas to help with the itching [not exactly sure but probably triamcinolone]. She apparently looked at the left lateral leg wound although this was not rebiopsied and I think felt to be ultimately part of the same pathogenesis. He is using sample border foam and changing nevus himself. He now has a new open area on the right posterior leg which was his biopsy site I don't have any of the dermatology notes  02/12/17; we put the patient in compression last week with SANTYL to the wound on the left leg and the biopsy. Edema is much better and the depth of the wound is now at level of skin. Area is still the same oBiopsy site on the right lateral leg we've also been using santyl with a border foam dressing and he is changing this himself. 02/19/17; Using silver alginate started last week to both the substantial left leg wound and the biopsy site on the right wound. He is tolerating compression well. Has a an appointment with his primary M.D. tomorrow wondering about diuretics although I'm wondering if  the edema problem is actually lymphedema 02/26/17; the patient has been to see his primary doctor Dr. Jerrel Ivory at [place] our primary care. She started him on Lasix 20 mg and this seems to have helped with the edema. However we are not making substantial change with the left lateral calf wound and inflammation. The biopsy site on the right leg also looks stable but not really all that different. 03/12/17; the patient has been to see vein and vascular Dr. Lucky Cowboy. He has had venous reflux studies I have not reviewed these. I did get a call from his dermatology office. They felt that he might have pathergy based on their biopsy on his right leg which led them to look at the slides of NILES, ESS ([id number]) the biopsy I did on the left leg and they wonder whether this represents pyoderma gangrenosum which was the original supposition in a man with ulcerative colitis albeit inactive for many years. They therefore recommended clobetasol and tetracycline i.e. 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 03/26/17; apparently the patient just had reflux studies not an appointment with Dr. dew. She arrives in clinic today having applied clobetasol for 2-3 weeks. He notes over the last 2-3 days excessive drainage having to change the dressing 3-4 times a day and also expanding erythema. 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 Lillia Carmel at [facility] locally who kindly saw him at my request. 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 My concern was the degree of erythema medially extending from his left leg wound which was either inflammation from pyoderma or cellulitis. I  put him on Augmentin however culture of the wound showed Pseudomonas which is quinolone sensitive. I really don't believe he has cellulitis however in view of everything I will continue and give him a course of Cipro. He is also on doxycycline as an immune modulator for the pyoderma. In addition to his original wound on the left lateral leg with surrounding erythema he has a wound on the right posterior calf which was an original biopsy site done by dermatology. 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 Saw Dr. Lillia Carmel yesterday. He has been using topical antibiotics to both wound areas his original wound on the left and the biopsies/pathergy area on the right. There is definitely some improvement in the inflammation around the wound on the right although the patient states he has increasing sensitivity of the wounds. He is on prednisone 60 and doxycycline 1 as prescribed by Dr. Lillia Carmel. He is covering the topical antibiotic with gauze  and putting this in his own compression stocks and changing this daily. He states that Dr. Lottie Rater did a culture of the left leg wound yesterday 05/07/17; pyoderma gangrenosum. The patient saw Dr. Lillia Carmel yesterday and has a follow-up with her in one month. He is still using topical antibiotics to both wounds although he can't recall exactly what type. He is still on prednisone 60 mg. Dr. Lillia Carmel stated that the doxycycline could stop if we were in agreement. 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 The right medial leg was induced by biopsy/pathergy. The area on the right is essentially healed. 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 The right medial leg has closed and remains closed. He is still on  prednisone 60. oHe tells me he missed his last dermatology appointment with Dr. Lillia Carmel but will make another appointment. He reports that her blood sugar at a recent screen in [place] was high 200's. He was 180 today. 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 I think he is going to require still much longer prednisone perhaps another 3 months before attempting to taper. In the meantime his wound is a lot better. Smaller. He is cleaning this off daily and applying topical antibiotics. When he was last in the clinic I thought about changing to [facility] and actually put in a couple of calls to dermatology although probably not during their business hours. 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 He continues on oral prednisone. He has been using triple antibiotic ointment. There is surface debris and we will transition to [facility] and have him return in 2 weeks. He has lost 30 pounds since his last appointment with lifestyle modification. He may benefit from topical steroid cream for treatment this can be considered at a later date. 08/22/17 on evaluation today patient appears to actually be doing rather well in regard to his left lateral lower extremity ulcer. He has actually been managed by Dr. Dellia Nims most recently. Patient is currently on oral steroids at this time. This seems to have been of benefit for him. Nonetheless his last visit was actually with Leah on 08/06/17. Currently he is not utilizing any topical steroid creams although this could be of benefit as well. No fevers, chills, nausea, or vomiting noted at this time. 09/05/17 on evaluation today patient appears to be doing better in regard to his left lateral lower extremity ulcer. He has been tolerating the dressing changes without complication. He is using Santyl with good effect. Overall I'm very  pleased with how things are standing at this point. Patient likewise is happy that this is doing better. 09/19/17 on evaluation today patient actually appears to be doing rather well in regard to his left lateral lower extremity ulcer. 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 He's not having any significant pain. 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 He has been tolerating the Santyl without complication and in general I feel like he's making good progress. 10/17/17 on evaluation today patient  appears to be doing very well in regard to his left lateral lower surety ulcer. He has been tolerating the dressing changes without complication. There does not appear to be any evidence of infection he's alternating the Santyl and the triple antibiotic ointment every other day this seems to be doing well for him. 11/03/17 on evaluation today patient appears to be doing very well in regard to his left lateral lower extremity ulcer. He is been tolerating the dressing changes without complication which is good news. Fortunately there does not appear to be any evidence of infection which is also great news. Overall is doing excellent they are starting to taper down on the prednisone is down to 40 mg at this point it also started topical clobetasol for him. 11/17/17 on evaluation today patient appears to be doing well in regard to his left lateral lower surety ulcer. He's been tolerating the dressing changes without complication. He does note that he is having no pain, no excessive drainage or discharge, and overall he feels like things are going about how he would expect and hope they would. Overall he seems to have no evidence of infection at this time in my opinion which is good news. 12/04/17-He is seen in follow-up evaluation  for right lateral lower extremity ulcer. He has been applying topical steroid cream. Today's measurement show slight increase in size. Over the next 2 weeks we will transition to every other day Santyl and steroid cream. He has been encouraged to monitor for changes and notify clinic with any concerns 12/15/17 on evaluation today patient's left lateral motion the ulcer and fortunately is doing worse again at this point. This just since last week to this week has close to doubled in size according to the patient. I did not seeing last week's I do not have a visual to compare this to in our system was also down so we do not have all the charts and at this point. Nonetheless it does have me somewhat concerned in regard to the fact that again he was worried enough about it he has contact the dermatology that placed them back on the full strength, 50 mg a day of the prednisone that he was taken previous. He continues to alternate using clobetasol along with Santyl at this point. He is obviously somewhat frustrated. 12/22/17 on evaluation today patient appears to be doing a little worse compared to last evaluation. Unfortunately the wound is a little deeper and slightly larger than the last week's evaluation. With that being said he has made some progress in regard to the irritation surrounding at this time unfortunately despite that progress that's been made he still has a significant issue going on here. 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 ZERICK, PREVETTE ([id number]) For that reason I discussed with him today the possibility of perform a wound culture to ensure there's nothing overtly infected. 01/06/18 on evaluation today patient's wound is larger and deeper than previously evaluated. With that being said it did appear that his wound was infected after  my last evaluation with him. Subsequently I did end up prescribing a prescription for Bactrim DS which she has been taking and having no complication with. Fortunately there does not appear to be any evidence of infection at this point in time as far as anything spreading, no want to touch, and overall I feel like  things are showing signs of improvement. 01/13/18 on evaluation today patient appears to be even a little larger and deeper than last time. There still muscle exposed in the base of the wound. Nonetheless he does appear to be less erythematous I do believe inflammation is calming down also believe the infection looks like it's probably resolved at this time based on what I'm seeing. No fevers, chills, nausea, or vomiting noted at this time. 01/30/18 on evaluation today patient actually appears to visually look better for the most part. Unfortunately those visually this looks better he does seem to potentially have what may be an abscess in the muscle that has been noted in the central portion of the wound. This is the first time that I have noted what appears to be fluctuance in the central portion of the muscle. With that being said I'm somewhat more concerned about the fact that this might indicate an abscess formation at this location. I do believe that an ultrasound would be appropriate. This is likely something we need to try to do as soon as possible. He has been switch to mupirocin ointment and he is no longer using the steroid ointment as prescribed by dermatology he sees them again next week he's been decreased from 60 to 40 mg of prednisone. 03/09/18 on evaluation today patient actually appears to be doing a little better compared to last time I saw him. There's not as much erythema surrounding the wound itself. He I did review his most recent infectious disease note which was dated 02/24/18. He saw Dr. Michel Bickers in [place]. 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 Patient is now off of antibiotics and they are continually observing this. There seems to be no change in the past two weeks in my pinion based on what the patient says and what I see today compared to what Dr. Megan Salon likely saw two weeks ago. No fevers, chills, nausea, or vomiting noted at this time. 03/23/18 on evaluation today patient's wound actually appears to be showing signs of improvement which is good news. He is currently still on the Dapsone. He is also working on tapering the prednisone to get off of this and Dr. Lottie Rater is working with him in this regard. Nonetheless overall I feel like the wound is doing well it does appear based on the infectious disease note that I reviewed from Dr. 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 I did also review the results of his ultrasound of left lower extremity which revealed there was a dentist tissue in the base of the wound without an abscess noted. 04/06/18 on evaluation today the patient's left lateral lower extremity ulcer actually appears to be doing fairly well which is excellent news. There does not appear to be any evidence of infection at this time which is also great news. Overall he still does have a significantly large ulceration although little by little he seems to be making progress. He is down to 10 mg a day of the prednisone. 04/20/18 on evaluation today patient actually appears to be doing excellent at this time in regard to his left lower extremity ulcer. He's making signs of good progress unfortunately this is taking much longer than we would really like to see but nonetheless he is  making progress. Fortunately there does not appear to be any evidence of infection at this time. No fevers, chills, nausea, or vomiting noted at this time.  The patient has not been using the Santyl due to the cost he hadn't got in this field yet. He's mainly been using the antibiotic ointment topically. Subsequently he also tells me that he really has not been scrubbing in the shower I think this would be helpful again as I told him it doesn't have to be anything too aggressive to even make it believe just enough to keep it free of some of the loose slough and biofilm on the wound surface. 05/11/18 on evaluation today patient's wound appears to be making slow but sure progress in regard to the left lateral lower extremity ulcer. He is been tolerating the dressing changes without complication. Fortunately there does not appear to be any evidence of infection at this time. He is still just using triple antibiotic ointment along with clobetasol occasionally over the area. He never got the Santyl and really does not seem to intend to in my pinion. 06/01/18 on evaluation today patient appears to be doing a little better in regard to his left lateral lower extremity ulcer. He states that overall he does not feel like he is doing as well with the Dapsone as he did with the prednisone. Nonetheless he sees his dermatologist later today and is gonna talk to them about the possibility of going back on the prednisone. Overall again I believe that the wound would be better if you would utilize Santyl but he really does not seem to be interested in going back to [place] at this point. He has been using triple antibiotic ointment. 06/15/18 on evaluation today patient's wound actually appears to be doing about the same at this point. Fortunately there is no signs of infection at this time. He has made slight improvements although he continues to not really want to clean the wound bed at this point. He states that he just doesn't mess with it he doesn't want to cause any problems with everything else he has going on. He has been on medication, antibiotics as prescribed  by his dermatologist, for a staff infection of his lower extremities which is really drying out now and looking much better he tells me. Fortunately there is no sign of overall infection. 06/29/18 on evaluation today patient appears to be doing well in regard to his left lateral lower surety ulcer all things considering. Fortunately his staff infection seems to be greatly improved compared to previous. He has no signs of infection and this is drying up quite nicely. He is still the doxycycline for this is no longer on cental, Dapsone, or any of the other medications. His dermatologist has recommended possibility of an infusion but right now he does not want to proceed with that. 07/13/18 on evaluation today patient appears to be doing about the same in regard to his left lateral lower surety ulcer. Fortunately there's no signs of infection at this time which is great news. Unfortunately he still builds up a significant amount of Slough/biofilm of the surface of the wound he still is not really cleaning this as he should be appropriately. Again I'm able to easily with saline and gauze remove the majority of this on the surface which if you would do this at home would likely be a dramatic improvement for him as far as getting the area to improve. Nonetheless overall I still feel like he  is making progress is just very slow. I think Santyl will be of benefit for him as well. Still he has not gotten this as of this point. 07/27/18 on evaluation today patient actually appears to be doing little worse in regards of the erythema around the periwound region of the wound he also tells me that he's been having more drainage currently compared to what he was experiencing last time I saw him. He states not quite as bad as what he had because this was infected previously but nonetheless is still appears to be doing poorly. Fortunately there is no evidence of systemic infection at this point. The patient tells me that  he is not going to be able to afford the Santyl. He is still waiting to hear about the infusion therapy with his dermatologist. Apparently she wants an updated colonoscopy first. 08/10/18 on evaluation today patient appears to be doing better in regard to his left lateral lower extremity ulcer. Fortunately he is showing signs of improvement in this regard he's actually been approved for Remicade infusion's as well although this has not been scheduled as of yet. Fortunately there's no signs of active infection at this time in regard to the wound although he is having some issues with infection of the right lower extremity is been seen as dermatologist for this. Fortunately they are definitely still working with him trying to keep things under control. NARESH, ALTHAUS ([id number]) 09/07/18 on evaluation today patient is actually doing rather well in regard to his left lateral lower extremity ulcer. He notes these actually having some hair grow back on his extremity which is something he has not seen in years. He also tells me that the pain is really not giving them any trouble at this time which is also good news overall she is very pleased with the progress he's using a combination of the mupirocin along with the probate is all mixed. 09/21/18 on evaluation today patient actually appears to be doing fairly well all things considered in regard to his looks from the ulcer. He's been tolerating the dressing changes without complication. Fortunately there's no signs of active infection at this time which is good news he is still on all antibiotics or prevention of the staff infection. He has been on prednisone for time although he states it is gonna contact his dermatologist and see if she put them on a short course due to some irritation that he has going on currently. Fortunately there's no evidence of any overall worsening this is going very slow I think cental would be something that would be helpful for him  although he states that $50 for tube is quite expensive. He therefore is not willing to get that at this point. 10/06/18 on evaluation today patient actually appears to be doing decently well in regard to his left lateral leg ulcer. He's been tolerating the dressing changes without complication. Fortunately there's no signs of active infection at this time. Overall I'm actually rather pleased with the progress he's making although it's slow he doesn't show any signs of infection and he does seem to be making some improvement. I do believe that he may need a switch up and dressings to try to help this to heal more appropriately and quickly. 10/19/18 on evaluation today patient actually appears to be doing better in regard to his left lateral lower extremity ulcer. This is shown signs of having much less Slough buildup at this point due to the fact he has been using  the Santyl. Obviously this is very good news. The overall size of the wound is not dramatically smaller but again the appearance is. 11/02/18 on evaluation today patient actually appears to be doing quite well in regard to his lower [place] ulcer. A lot of the skin around the ulcer is actually somewhat irritating at this point this seems to be more due to the dressing causing irritation from the adhesive that anything else. Fortunately there is no signs of active infection at this time. 11/24/18 on evaluation today patient appears to be doing a little worse in regard to his overall appearance of his lower extremity ulcer. There's more erythema and warmth around the wound unfortunately. He is currently on doxycycline which he has been on for some time. With that being said I'm not sure that seems to be helping with what appears to possibly be an acute cellulitis with regard to his left lower extremity ulcer. No fevers, chills, nausea, or vomiting noted at this time. 12/08/18 on evaluation today patient's wounds actually appears to be doing  significantly better compared to his last evaluation. He has been using Santyl along with alternating tripling about appointment as well as the steroid cream seems to be doing quite well and the wound is showing signs of improvement which is excellent news. Fortunately there's no evidence of infection and in fact his culture came back negative with only normal skin flora noted. 12/21/2018 upon evaluation today patient actually appears to be doing excellent with regard to his ulcer. This is actually the best that I have seen it since have been helping to take care of him. It is both smaller as well as less slough noted on the surface of the wound and seems to be showing signs of good improvement with new skin growing from the edges. He has been using just the triamcinolone he does wonder if he can get a refill of that ointment today. 01/04/2019 upon evaluation today patient actually appears to be doing well with regard to his left lateral lower extremity ulcer. With that being said it does not appear to be that he is doing quite as well as last time as far as progression is concerned. There does not appear to be any signs of infection or significant irritation which is good news. With that being said I do believe that he may benefit from switching to a collagen based dressing based on how clean The wound appears. 01/18/2019 on evaluation today patient actually appears to be doing well with regard to his wound on the left lower extremity. He is not made a lot of progress compared to where we were previous but nonetheless does seem to be doing okay at this time which is good news. There is no signs of active infection which is also good news. My only concern currently is I do wish we can get him into utilizing the collagen dressing his insurance would not pay for the supplies that we ordered although it appears that he may be able to order this through his supply company that he typically utilizes. This is  [employer]. Nonetheless he did try to order it during the office visit today and it appears this did go through. We will see if he can get that it is a different brand but nonetheless he has collagen and I do think will be beneficial. 02/01/2019 on evaluation today patient actually appears to be doing a little worse today in regard to the overall size of his wounds. Fortunately there  is no signs of active infection at this time. That is visually. Nonetheless when this is happened before it was due to infection. For that reason were somewhat concerned about that this time as well. 02/08/2019 on evaluation today patient unfortunately appears to be doing slightly worse with regard to his wound upon evaluation today. Is measuring a little deeper and a little larger unfortunately. I am not really sure exactly what is causing this to enlarge he actually did see his dermatologist she is going to see about initiating Humira for him. Subsequently she also did do steroid injections into the wound itself in the periphery. Nonetheless still nonetheless he seems to be getting a little bit larger he is gone back to just using the steroid cream topically which I think is appropriate. I would say hold off on the collagen for the time being is definitely a good thing to do. Based on the culture results which we finally did get the final result back regarding it shows staph as the bacteria noted again that can be a normal skin bacteria based on the fact however he is having increased drainage and worsening of the wound measurement wise I would go ahead and place him on an antibiotic today I do believe for this. 02/15/2019 on evaluation today patient actually appears to be doing somewhat better in regard to his ulcer. There is no signs of worsening at this time I did review his culture results which showed evidence of Staphylococcus aureus but not MRSA. Again this could just be more related to the normal skin bacteria  although he states the drainage has slowed down quite a bit he may have had a mild infection not just colonization. And was much smaller and then since around10/04/2019 on evaluation today patient appears to be doing unfortunately worse as far as the size of the wound. I really feel like that this is steadily getting larger again it had been doing excellent right at the beginning of September we have seen a steady increase in the area of the wound it is almost 2-1/2 times the size it was on September 1. Obviously this is a bad trend this is not wanting to see. For that reason we went back to using just the topical triamcinolone cream which does seem to help with inflammation. I checked him for bacteria by way of culture and nothing showed positive there. I am considering giving him a short course of a tapering steroid Dosepak today to see if that is can be beneficial for him. The patient is in agreement with giving that a try. 03/08/2019 on evaluation today patient appears to be doing very well in comparison to last evaluation with regard to his lower extremity ulcer. This is showing signs of less inflammation and actually measuring slightly smaller compared to last time every other week over the past month and a half he has been measuring larger larger larger. Nonetheless I do believe that the issue has been inflammation the prednisone does seem to [facility], [place] ([id number]) have been beneficial for him which is good news. No fevers, chills, nausea, vomiting, or diarrhea. 03/22/2019 on evaluation today patient appears to be doing about the same with regard to his leg ulcer. He has been tolerating the dressing changes without complication. With that being said the wound seems to be mostly arrested at its current size but really is not making any progress except for when we prescribed the prednisone. He did show some signs of dropping as far as  the overall size of the wound during that interval week.  Nonetheless this is something he is not on long-term at this point and unfortunately I think he is getting need either this or else the Humira which his dermatologist has discussed try to get approval for. With that being said he will be seeing his dermatologist on the 11th of this month that is November. 04/19/2019 on evaluation today patient appears to be doing really about the same the wound is measuring slightly larger compared to last time I saw him. He has not been into the office since November 2 due to the fact that he unfortunately had Covid as that his entire family. He tells me that it was rough but they did pull-through and he seems to be doing much better. Fortunately there is no signs of active infection at this time. No fevers, chills, nausea, vomiting, or diarrhea. 05/10/2019 on evaluation today patient unfortunately appears to be doing significantly worse as compared to last time I saw him. He does tell me that he has had his first dose of Humira and actually is scheduled to get the next one in the upcoming week. With that being said he tells me also that in the past several days he has been having a lot of issues with green drainage she showed me a picture this is more blue-green in color. He is also been having issues with increased sloughy buildup and the wound does appear to be larger today. Obviously this is not the direction that we want everything to take based on the starting of his Humira. Nonetheless I think this is definitely a result of likely infection and to be honest I think this is probably Pseudomonas causing the infection based on what I am seeing. 05/24/2019 on evaluation today patient unfortunately appears to be doing significantly worse compared to his prior evaluation with me 2 weeks ago. I did review his culture results which showed that he does have Staph aureus as well as Pseudomonas noted on the culture. Nonetheless the Levaquin that I prescribed for him does  not appear to have been appropriate and in fact he tells me he is no longer experiencing the green drainage and discharge that he had at the last visit. Fortunately there is no signs of active infection at this time which is good news although the wound has significantly worsened it in fact is much deeper than it was previous. We have been utilizing up to this point triamcinolone ointment as the prescription topical of choice but at this time I really feel like that the wound is getting need to be packed in order to appropriately manage this due to the deeper nature of the wound. Therefore something along the lines of an alginate dressing may be more appropriate. 05/31/2019 upon inspection today patient's wound actually showed signs of doing poorly at this point. Unfortunately he just does not seem to be making any good progress despite what we have tried. He actually did go ahead and pick up the Cipro and start taking that as he was noticing more green drainage he had previously completed the Levaquin that I prescribed for him as well. Nonetheless he missed his appointment for the seventh last week on Wednesday with the wound care center and [facility] where his dermatologist referred him. Obviously I do think a second opinion would be helpful at this point especially in light of the fact that the patient seems to be doing so poorly despite the fact  that we have tried everything that I really know how at this point. The only thing that ever seems to have helped him in the past is when he was on high doses of continual steroids that did seem to make a difference for him. 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 06/07/2019 upon evaluation today patient unfortunately appears to be doing worse yet again with regard to his wound. In fact I am  starting to question whether or not he may have a fluid pocket in the muscle at this point based on the bulging and the soft appearance to the central portion of the muscle area. There is not anything draining from the muscle itself at this time which is good news but nonetheless the wound is expanding. I am not really seeing any results of the Humira as far as overall wound progression based on what I am seeing at this point. The patient has been referred for second opinion with regard to his wound to the [facility] wound care center by his dermatologist which I definitely am not in opposition to. Unfortunately we tried multiple dressings in the past including collagen, alginate, and at one point even Hydrofera Blue. With that being said he is never really used it for any significant amount of time due to the fact that he often complains of pain associated with these dressings and then will go back to either using the Santyl which she has done intermittently or more frequently the triamcinolone. He is also using his own compression stockings. We have wrapped him in the past but again that was something else that he really was not a big fan of. 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 He seemed to do much better when he was on the steroids although honestly I understand that the steroids are not necessarily the best medication to be on long-term obviously 06/14/2019 on evaluation today patient appears to be doing actually a little bit better with regard to the overall appearance with his leg. Unfortunately he does continue to have issues with what appears to be some fluid underneath the  muscle although he did see the wound specialty center at [facility] last week their main goals were to see about infusion therapy in place of the Humira as they feel like that is not quite strong enough. They also recommended that we continue with the treatment otherwise as we are they felt like that was appropriate and they are okay with him continuing to follow-up here with [place] in that regard. With that being said they are also sending him to the vein specialist there to see about vein stripping and if that would be of benefit for him. Subsequently they also did not really address whether or not an ultrasound of the muscle area to see if there is anything that needs to be addressed here would be appropriate or not. For that reason I discussed this with him last week I think we  may proceed down that road at this point. 06/21/2019 upon evaluation today patient's wound actually appears to be doing slightly better compared to previous evaluations. I do believe that he has made a difference with regard to the progression here with the use of oral steroids. Again in the past has been the only thing that is really calm things down. He does tell me that from [facility] is gotten a good news from there that there are no further vein stripping that is necessary at this point. I do not have that available for review today although the patient did relay this to me. He also did obtain and have the ultrasound of the wound completed which I did sign off on today. It does appear that there is no fluid collection under the muscle this is likely then just edematous tissue in general. That is also good news. Overall I still believe the inflammation is the main issue here. He did inquire about the possibility of a wound VAC again with the muscle protruding like it is I am not really sure whether the wound VAC is necessarily ideal or not. That is something we will have to consider although I do believe he may need compression wrapping to  try to help with edema control which could potentially be of benefit. 06/28/2019 on evaluation today patient appears to be doing slightly better measurement wise although this is not terribly smaller he least seems to be trending towards that direction. With that being said he still seems to have purulent drainage noted in the wound bed at this time. He has been on Levaquin followed by Cipro over the past month. Unfortunately he still seems to have some issues with active infection at this time. I did perform a culture last week in order to evaluate and see if indeed there was still anything going on. Subsequently the culture did come back showing Pseudomonas which is consistent with the drainage has been having which is blue-green in color. He also has had an odor that again was somewhat consistent with Pseudomonas as well. Long story short it appears that the culture showed an intermediate finding with regard to how well the Cipro will work for the Pseudomonas infection. Subsequently being that he does not seem to be clearing up and at best what we are doing is just keeping this at [place] I think he may need to see infectious disease to discuss IV antibiotic options. PRANEEL, HAISLEY ([id number]) 07/05/2019 upon evaluation today patient appears to be doing okay in regard to his leg ulcer. He has been tolerating the dressing changes at this point without complication. Fortunately there is no signs of active infection at this time which is good news. No fevers, chills, nausea, vomiting, or diarrhea. With that being said he does have an appointment with infectious disease tomorrow and his primary care on Wednesday. Again the reason for the infectious disease referral was due to the fact that he did not seem to be fully resolving with the use of oral antibiotics and therefore we were thinking that IV antibiotic therapy may be necessary secondary to the fact that there was an intermediate finding for  how effective the Cipro may be. Nonetheless again he has been having a lot of purulent and even green drainage. Fortunately right now that seems to have calmed down over the past week with the reinitiation of the oral antibiotic. Nonetheless we will see what Dr. Megan Salon has to say. 07/12/2019 upon evaluation today patient appears to be  doing about the same at this point in regard to his left lower extremity ulcer. Fortunately there is no signs of active infection at this time which is good news I do believe the Levaquin has been beneficial I did review Dr. Hale Bogus note and to be honest I agree that the patient's leg does appear to be doing better currently. What we found in the past as he does not seem to really completely resolve he will stop the antibiotic and then subsequently things will revert back to having issues with blue-green drainage, increased pain, and overall worsening in general. Obviously that is the reason I sent him back to infectious disease. 07/19/2019 upon evaluation today patient appears to be doing roughly the same in size there is really no dramatic improvement. He has started back on the Levaquin at this point and though he seems to be doing okay he did still have a lot of blue/green drainage noted on evaluation today unfortunately. I think that this is still indicative more likely of a Pseudomonas infection as previously noted and again he does see Dr. Megan Salon in just a couple of days. I do not know that were really able to effectively clear this with just oral antibiotics alone based on what I am seeing currently. Nonetheless we are still continue to try to manage as best we can with regard to the patient and his wound. I do think the wrap was helpful in decreasing the edema which is excellent news. No fevers, chills, nausea, vomiting, or diarrhea. 07/26/2019 upon evaluation today patient appears to be doing slightly better with regard to the overall appearance of the muscle  there is no dark discoloration centrally. Fortunately there is no signs of active infection at this time. No fevers, chills, nausea, vomiting, or diarrhea. Patient's wound bed currently the patient did have an appointment with Dr. Megan Salon at infectious disease last week. With that being said Dr. Megan Salon the patient states was still somewhat hesitant about put him on any IV antibiotics he wanted [place] to repeat cultures today and then see where things go going forward. He does look like Dr. Megan Salon because of some improvement the patient did have with the Levaquin wanted [place] to see about repeating cultures. If it indeed grows the Pseudomonas again then he recommended a possibility of considering a PICC line placement and IV antibiotic therapy. He plans to see the patient back in 1 to 2 weeks. 08/02/2019 upon evaluation today patient appears to be doing poorly with regard to his left lower extremity. We did get the results of his culture back it shows that he is still showing evidence of Pseudomonas which is consistent with the purulent/blue-green drainage that he has currently. Subsequently the culture also shows that he now is showing resistance to the oral fluoroquinolones which is unfortunate as that was really the only thing to treat the infection prior. I do believe that he is looking like this is going require IV antibiotic therapy to get this under control. Fortunately there is no signs of systemic infection at this time which is good news. The patient does see Dr. Megan Salon tomorrow. 08/09/2019 upon evaluation today patient appears to be doing better with regard to his left lower extremity ulcer in regard to the overall appearance. He is currently on IV antibiotic therapy. As ordered by Dr. Megan Salon. Currently the patient is on ceftazidime which she is going to take for the next 2 weeks and then follow-up for 4 to 5-week appointment with Dr. Megan Salon.  The patient started this this past Friday  symptoms have not for a total of 3 days currently in full. 08/16/2019 upon evaluation today patient's wound actually does show muscle in the base of the wound but in general does appear to be much better as far as the overall evidence of infection is concerned. In fact I feel like this is for the most part cleared up he still on the IV antibiotics he has not completed the full course yet but I think he is doing much better which is excellent news. 08/23/2019 upon evaluation today patient appears to be doing about the same with regard to his wound at this point. He tells me that he still has pain unfortunately. Fortunately there is no evidence of systemic infection at this time which is great news. There is significant muscle protrusion. 09/13/19 upon evaluation today patient appears to be doing about the same in regard to his leg unfortunately. He still has a lot of drainage coming from the ulceration there is still muscle exposed. With that being said the patient's last wound culture still showed an intermediate finding with regard to the Pseudomonas he still having the bluish/green drainage as well. Overall I do not know that the wound has completely cleared of infection at this point. Fortunately there is no signs of active infection systemically at this point which is good news. 09/20/2019 upon evaluation today patient's wound actually appears to be doing about the same based on what I am seeing currently. I do not see any signs of systemic infection he still does have evidence of some local infection and drainage. He did see Dr. Megan Salon last week and Dr. Megan Salon states that he probably does need a different IV antibiotic although he does not want to put him on this until the patient begins the Remicade infusion which is actually scheduled for about 10 days out from today on 13 May. Following that time Dr. Megan Salon is good to see him back and then will evaluate the feasibility of starting him on the  IV antibiotic therapy once again at that point. I do not disagree with this plan I do believe as Dr. Megan Salon stated in his note that I reviewed today that the patient's issue is multifactorial with the pyoderma being 1 aspect of this that were hoping the Remicade will be helpful for her. In the meantime I think the gentamicin is, helping to keep things under decent okay control in regard to the ulcer. 09/27/2019 upon evaluation today patient appears to be doing about the same with regard to his wound still there is a lot of muscle exposure though he does have some hyper granulation tissue noted around the edge and actually some granulation tissue starting to form over the muscle which is actually good news. Fortunately there is no evidence of active infection which is also good news. His pain is less at this point. 5/21; this is a patient I have not seen in a long time. 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 He has a large wound on the left lateral leg with protruding muscle. He comes in the clinic today showing the same area on his left medial ankle. He says there is been a spot there for some time although we have not previously defined this. Today he has a clearly defined area with slight amount of skin breakdown surrounded by raised areas with a purplish hue in color. This is not painful he says it is irritated.  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 He still has muscle protruding from the lateral portion of his left leg but fortunately the new area that they were concerned about at his last visit does not appear to have opened at this point. He is currently on Remicade infusions and seems to be doing better in my opinion in fact the wound itself seems to be overall much better. The purplish discoloration that he did have seems to have resolved  and I think that is a good sign that hopefully the Remicade is doing its job. He does have some biofilm noted over the surface of the wound. 11/01/2019 on evaluation today patient's wound actually appears to be doing excellent at this time. Fortunately there is no evidence of active infection and overall I feel like he is making great progress. The Remicade seems to be due excellent job in my opinion. ETHIN, DRUMMOND ([id number]) 11/08/19 evaluation today vision actually appears to be doing quite well with regard to his weight ulcer. He's been tolerating dressing changes without complication. Fortunately there is no evidence of infection. No fevers, chills, nausea, or vomiting noted at this time. Overall states that is having more itching than pain which is actually a good sign in my opinion. 12/13/2019 upon evaluation today patient appears to be doing well today with regard to his wound. He has been tolerating the dressing changes without complication. Fortunately there is no sign of active infection at this time. No fevers, chills, nausea, vomiting, or diarrhea. Overall I feel like the infusion therapy has been very beneficial for him. 01/06/2020 on evaluation today patient appears to be doing well with regard to his wound. This is measuring smaller and actually looks to be doing better. Fortunately there is no signs of active infection at this point. No fevers, chills, nausea, vomiting, or diarrhea. With that being said he does still have the blue-green drainage but this does not seem to be causing any significant issues currently. He has been using the gentamicin that does seem to be keeping things under decent control at this point. He goes later this morning for his next infusion therapy for the pyoderma which seems to also be very beneficial. 02/07/2020 on evaluation today patient appears to be doing about the same in regard to his wounds currently. Fortunately there is no signs of active infection  systemically he does still have evidence of local infection still using gentamicin. He also is showing some signs of improvement albeit slowly I do feel like we are making some progress here. 02/21/2020 upon evaluation today patient appears to be making some signs of improvement the wound is measuring a little bit smaller which is great news and overall I am very pleased with where he stands currently. He is going to be having infusion therapy treatment on the 15th of this month. Fortunately there is no signs of active infection at this time. 03/13/2020 I do believe patient's wound is actually showing some signs of improvement here which is great news. He has continue with the infusion therapy through rheumatology[facility]dermatology at [facility]. That does seem to be beneficial. I still think he gets as much benefit from this as he did from the prednisone initially but nonetheless obviously this is less harsh on his body that the prednisone as far as they are concerned. 03/31/2020 on evaluation today patient's wound actually showing signs of some pretty good improvement in regard  to the overall appearance of the wound bed. There is still muscle exposed though he does have some epithelial growth around the edges of the wound. Fortunately there is no signs of active infection at this time. No fevers, chills, nausea, vomiting, or diarrhea. 04/24/2020 upon evaluation today patient appears to be doing about the same in regard to his leg ulcer. He has been tolerating the dressing changes without complication. Fortunately there is no signs of active infection at this time. No fevers, chills, nausea, vomiting, or diarrhea. With that being said he still has a lot of irritation from the bandaging around the edges of the wound. We did discuss today the possibility of a referral to plastic surgery. 05/22/2020 on evaluation today patient appears to be doing well with regard to his wounds all things considered. He has not been able  to get the Chantix apparently there is a recall nurse that I was unaware of put out by [employer] involuntarily. Nonetheless for now I am and I have to do some research into what may be the best option for him to help with quitting in regard to smoking and we discussed that today. Electronic Signature(s) Signed: 05/22/2020 8:38:53 AM By: Worthy Keeler PA-C Entered By: Worthy Keeler on 05/22/2020 08:38:53 ATILANO, COVELLI ([id number]) -------------------------------------------------------------------------------- Physical Exam Details Patient Name: [name] Date of Service: 05/22/2020 8:00 AM Medical Record Number: [id number] Patient Account Number: [id number] Date of Birth/Sex: 1980-11-13 (39 y.o. M) Treating RN: [name] Primary Care Provider: [name] Other Clinician: Referring Provider: [name] Treating Provider/Extender: [name] Cliche in Treatment: 180 Constitutional Obese and well-hydrated in no acute distress. Respiratory normal breathing without difficulty. Psychiatric this patient is able to make decisions and demonstrates good insight into disease process. Alert and Oriented x 3. pleasant and cooperative. Notes Inspection patient's wound bed actually does not show signs of being too bad currently which is good news. Fortunately there is no evidence of active infection which is great news and overall very pleased regard. With that being said it is still very slow to heal obviously. I think that a plastic surgery consult could be beneficial but again we need to help with getting him stop with regard to smoking before we get to that point. Electronic Signature(s) Signed: 05/22/2020 8:39:18 AM By: Worthy Keeler PA-C Entered By: Worthy Keeler on 05/22/2020 08:39:18 MERION, GRIMALDO ([id number]) -------------------------------------------------------------------------------- Physician Orders Details Patient Name: [name] Date of Service: 05/22/2020 8:00  AM Medical Record Number: [id number] Patient Account Number: [id number] Date of Birth/Sex: 1980/05/26 (39 y.o. 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 Wound Cleansing/Bathing/Shower/Hygiene o Clean wound with Normal Saline. o May shower without dressing. Gently cleanse wound with antibacterial soap, rinse and pat dry prior to dressing wounds Peri-Wound Care Wound #1 Left,Lateral Lower Leg o Vitamin AandD Ointment Medication/ointment applied directly to wound bed Wound #1 Left,Lateral Lower Leg o Gentamicin Sulfate Cream o Triamcinolone Acetonide Ointment (TCA) Primary Wound Dressing Wound #1 Left,Lateral Lower Leg o Boardered Foam Dressing Dressing Change Frequency Wound #  1 Left,Lateral Lower Leg o Change dressing every day. Edema Control o Patient to wear own compression stockings. Remove compression stockings every night before going to bed and put on every morning when getting up. Additional Orders / Instructions o Decrease/Stop Smoking Electronic Signature(s) Signed: 05/22/2020 2:13:03 PM By: Worthy Keeler PA-C Signed: 05/22/2020 3:19:28 PM By: [name], [name], RN, [name], [name], [name] Entered By: [name], [name], RN, [name], [name] on 05/22/2020 08:37:01 IDAN, PRIME  ([id number]) -------------------------------------------------------------------------------- Problem List Details Patient Name: [name] Date of Service: 05/22/2020 8:00 AM Medical Record Number: [id number] Patient Account Number: [id number] Date of Birth/Sex: 11/13/1980 (39 y.o. M) Treating RN: [name] Primary Care Provider: [name] Other Clinician: Referring Provider: [name] Treating Provider/Extender: [name] Cliche in Treatment: 180 Active Problems ICD-10 Encounter Code Description Active Date MDM Diagnosis L97.222 Non-pressure chronic ulcer of left calf with fat layer exposed 12/04/2016 No Yes L88 Pyoderma gangrenosum 03/26/2017 No Yes L97.321 Non-pressure chronic ulcer of left ankle limited to breakdown of skin 10/08/2019 No Yes I87.2 Venous insufficiency (chronic) (peripheral) 12/04/2016 No Yes L03.116 Cellulitis of left lower limb 05/24/2019 No Yes F17.208 Nicotine dependence, unspecified, with other nicotine-induced disorders 04/24/2020 No Yes Inactive Problems ICD-10 Code Description Active Date Inactive Date L97.213 Non-pressure chronic ulcer of right calf with necrosis of muscle 04/02/2017 04/02/2017 Resolved Problems Electronic Signature(s) Signed: 05/22/2020 8:25:26 AM By: Worthy Keeler PA-C Entered By: Worthy Keeler on 05/22/2020 08:25:25 [name] ([id number]) -------------------------------------------------------------------------------- Progress Note Details Patient Name: [name] Date of Service: 05/22/2020 8:00 AM Medical Record Number: [id number] Patient Account Number: [id number] Date of Birth/Sex: 03-11-1981 (39 y.o. M) Treating RN: [name] Primary Care Provider: [name] Other Clinician: Referring Provider: [name] Treating Provider/Extender: [name] Cliche in Treatment: 180 Subjective Chief Complaint Information obtained from Patient He is here in follow up evaluation for LLE pyoderma ulcer History  of Present Illness (HPI) 12/04/16; 40 year old man who comes into the clinic today for review of a wound on the posterior left calf. He tells me that is been there for about a year. He is not a diabetic he does smoke half a pack per day. He was seen in the ER on 11/20/16 felt to have cellulitis around the wound and was given clindamycin. An x-ray did not show osteomyelitis. The patient initially tells me that he has a milk allergy that sets off a pruritic itching rash on his lower legs which she scratches incessantly and he thinks that's what may have set up the wound. He has been using various topical antibiotics and ointments without any effect. He works in a [employer] and is on his feet all day. He does not have a prior history of wounds however he does have the rash on both lower legs the right arm and the ventral aspect of his left arm. These are excoriations and clearly have had scratching however there are of macular looking areas on both legs including a substantial larger area on the right leg. This does not have an underlying open area. There is no blistering. The patient tells me that 2 years ago in [place] in response to the rash on his legs he saw a dermatologist who told him he had a condition which may be pyoderma gangrenosum although I may be putting words into his mouth. He seemed to recognize this. 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 He is not in any treatment for this. He's had no recent travel  12/11/16; the patient arrives today with his wound and roughly the same condition we've been using silver alginate this is a deep punched out wound with some surrounding erythema but no tenderness. Biopsy I did did not show confirmed pyoderma gangrenosum suggested nonspecific inflammation and vasculitis but does not provide an actual description of what was seen by the pathologist. I'm really not able to understand this We have also received  information from the patient's dermatologist in [place] notes from April 2016. This was a doctor Agarwal-antal. 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 He was prescribed topical steroid high potency under occlusion which helped but at this point the patient did not have a deep punched out wound. 12/18/16; the patient's wound is larger in terms of surface area however this surface looks better and there is less depth. The surrounding erythema also is better. The patient states that the wrap we put on came off 2 days ago when he has been using his compression stockings. He we are in the process of getting a dermatology consult. 12/26/16 on evaluation today patient's left lower extremity wound shows evidence of infection with surrounding erythema noted. He has been tolerating the dressing changes but states that he has noted more discomfort. There is a larger area of erythema surrounding the wound. No fevers, chills, nausea, or vomiting noted at this time. With that being said the wound still does have slough covering the surface. He is not allergic to any medication that he is aware of at this point. In regard to his right lower extremity he had several regions that are erythematous and pruritic he wonders if there's anything we can do to help that. 01/02/17 I reviewed patient's wound culture which was obtained his visit last week. He was placed on doxycycline at that point. Unfortunately that does not appear to be an antibiotic that would likely help with the situation however the pseudomonas noted on culture is sensitive to Cipro. Also unfortunately patient's wound seems to have a large compared to last week's evaluation. Not severely so but there are definitely increased measurements in general. He is continuing to have discomfort as well he writes this to be a seven out of 10. In fact he would prefer me not to perform any debridement today due to the fact that he is having discomfort and  considering he has an active infection on the little reluctant to do so anyway. No fevers, chills, nausea, or vomiting noted at this time. 01/08/17; patient seems dermatology on September 5. I suspect dermatology will want the slides from the biopsy I did sent to their pathologist. I'm not sure if there is a way we can expedite that. In any case the culture I did before I left on vacation 3 weeks ago showed Pseudomonas he was given 10 days of Cipro and per her description of her intake nurses is actually somewhat better this week although the wound is quite a bit bigger than I remember the last time I saw this. He still has 3 more days of Cipro 01/21/17; dermatology appointment tomorrow. He has completed the ciprofloxacin for Pseudomonas. Surface of the wound looks better however he is had some deterioration in the lesions on his right leg. Meantime the left lateral leg wound we will continue with sample 01/29/17; patient had his dermatology appointment but I can't yet see that note. He is completed his antibiotics. The wound is more superficial but considerably larger in circumferential area than when he came in. This  is in his left lateral calf. He also has swollen erythematous areas with superficial wounds on the right leg and small papular areas on both arms. There apparently areas in her his upper thighs and buttocks I did not look at those. Dermatology biopsied the right leg. Hopefully will have their input next week. 02/05/17; patient went back to see his dermatologist who told him that he had a "scratching problem" as well as staph. He is now on a 30 day course of doxycycline and I believe she gave him triamcinolone cream to the right leg areas to help with the itching [not exactly sure but probably triamcinolone]. She apparently looked at the left lateral leg wound although this was not rebiopsied and I think felt to be ultimately part of the same pathogenesis. He is using sample border foam and  changing nevus himself. He now has a new open area on the right posterior leg which was his biopsy site I don't have any of the dermatology notes 02/12/17; we put the patient in compression last week with SANTYL to the wound on the left leg and the biopsy. Edema is much better and the depth of the wound is now at level of skin. Area is still the same Biopsy site on the right lateral leg we've also been using santyl with a border foam dressing and he is changing this himself. 02/19/17; Using silver alginate started last week to both the substantial left leg wound and the biopsy site on the right wound. He is tolerating compression well. Has a an appointment with his primary M.D. tomorrow wondering about diuretics although I'm wondering if the edema problem is actually lymphedema DURWIN, DAVISSON ([id number]) 02/26/17; the patient has been to see his primary doctor Dr. Jerrel Ivory at [facility] our primary care. She started him on Lasix 20 mg and this seems to have helped with the edema. However we are not making substantial change with the left lateral calf wound and inflammation. The biopsy site on the right leg also looks stable but not really all that different. 03/12/17; the patient has been to see vein and vascular Dr. Lucky Cowboy. He has had venous reflux studies I have not reviewed these. I did get a call from his dermatology office. 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 They therefore recommended clobetasol and tetracycline i.e. 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 03/26/17; apparently the patient just had reflux studies not an appointment with Dr. dew. She arrives in clinic today having applied clobetasol for 2-3  weeks. He notes over the last 2-3 days excessive drainage having to change the dressing 3-4 times a day and also expanding erythema. 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 Lillia Carmel at [facility] dermatology locally who kindly saw him at my request. 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 My concern was the degree of erythema medially extending from his left leg wound which was either inflammation from  pyoderma or cellulitis. I put him on Augmentin however culture of the wound showed Pseudomonas which is quinolone sensitive. I really don't believe he has cellulitis however in view of everything I will continue and give him a course of Cipro. He is also on doxycycline as an immune modulator for the pyoderma. In addition to his original wound on the left lateral leg with surrounding erythema he has a wound on the right posterior calf which was an original biopsy site done by dermatology. 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 Saw Dr. Lillia Carmel yesterday. He has been using topical antibiotics to both wound areas his original wound on the left and the biopsies/pathergy area on the right. There is definitely some improvement in the inflammation around the wound on the right although the patient states he has increasing sensitivity of the wounds. He is on prednisone 60 and doxycycline 1 as prescribed by Dr. Lillia Carmel. He is covering the topical antibiotic with gauze and putting this in his own compression stocks and changing this daily. He states that Dr. Lottie Rater did a culture of the left leg wound yesterday 05/07/17; pyoderma  gangrenosum. The patient saw Dr. Lillia Carmel yesterday and has a follow-up with her in one month. He is still using topical antibiotics to both wounds although he can't recall exactly what type. He is still on prednisone 60 mg. Dr. Lillia Carmel stated that the doxycycline could stop if we were in agreement. 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 The right medial leg was induced by biopsy/pathergy. The area on the right is essentially healed. 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 The right medial leg has closed and remains closed. He is still on prednisone 60. He tells me he missed his last dermatology appointment with Dr. Lillia Carmel but will make another appointment. He reports that her blood sugar at a recent screen in [place] was high 200's. He was 180 today. 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 I think he is going to require still much longer prednisone perhaps another 3 months before attempting to taper. In the meantime his wound is a lot better. Smaller. He is cleaning this off daily and applying topical antibiotics. When he was last in the clinic I thought about changing to [facility] and actually put in a couple of calls to dermatology although probably not during their business hours. 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 He continues on oral prednisone. He has been using triple antibiotic ointment. There is surface debris and we will transition to [name] and have him return in 2 weeks. He has lost 30 pounds since his last appointment with lifestyle modification. He may benefit from topical steroid cream for treatment this can be considered at a later  date. 08/22/17 on evaluation today patient appears to actually be doing rather well in regard to his left lateral lower extremity ulcer. He has actually been managed by Dr. Dellia Nims most recently. Patient is currently on oral steroids at this time. This seems to have been of benefit for him. Nonetheless his last visit was actually with Leah on 08/06/17. Currently he is not utilizing any topical steroid  creams although this could be of benefit as well. No fevers, chills, nausea, or vomiting noted at this time. 09/05/17 on evaluation today patient appears to be doing better in regard to his left lateral lower extremity ulcer. He has been tolerating the dressing changes without complication. He is using Santyl with good effect. Overall I'm very pleased with how things are standing at this point. Patient likewise is happy that this is doing better. 09/19/17 on evaluation today patient actually appears to be doing rather well in regard to his left lateral lower extremity ulcer. 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 He's not having any significant pain. 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 He has been tolerating the Santyl without complication and in general I feel like he's making good progress. 10/17/17 on evaluation today patient appears to be doing very well in regard to his left lateral lower surety ulcer. He has been tolerating the dressing changes without complication. There does not appear to be any evidence of infection he's alternating the Santyl and the triple antibiotic ointment every other day this seems to be doing well for him. 11/03/17 on evaluation today patient appears to be doing very well in regard to his left lateral lower extremity ulcer. He is been tolerating the dressing changes without  complication which is good news. Fortunately there does not appear to be any evidence of infection which is also great news. Overall is doing excellent they are starting to taper down on the prednisone is down to 40 mg at this point it also started topical clobetasol for him. 11/17/17 on evaluation today patient appears to be doing well in regard to his left lateral lower surety ulcer. He's been tolerating the dressing changes without complication. He does note that he is having no pain, no excessive drainage or discharge, and overall he feels like things are going about how he would expect and hope they would. Overall he seems to have no evidence of infection at this time in my opinion which is good news. 12/04/17-He is seen in follow-up evaluation for right lateral lower extremity ulcer. He has been applying topical steroid cream. Today's measurement show slight increase in size. Over the next 2 weeks we will transition to every other day Santyl and steroid cream. He has been encouraged to monitor for changes and notify clinic with any concerns 12/15/17 on evaluation today patient's left lateral motion the ulcer and fortunately is doing worse again at this point. This just since last week to this week has close to doubled in size according to the patient. I did not seeing last week's I do not have a visual to compare this to in our system was also down so we do not have all the charts and at this point. Nonetheless it does have me somewhat concerned in regard to the fact that again he was worried enough about it he has contact the dermatology that placed them back on the full strength, 50 mg a day of the prednisone that he was taken previous. He continues to alternate using clobetasol along with Santyl at this point. He is obviously somewhat frustrated. PHEONIX, CLINKSCALE ([id number]) 12/22/17 on evaluation today patient appears to be doing a little worse compared to last evaluation. Unfortunately the wound is  a little deeper and slightly larger than the  last week's evaluation. With that being said he has made some progress in regard to the irritation surrounding at this time unfortunately despite that progress that's been made he still has a significant issue going on here. 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 For that reason I discussed with him today the possibility of perform a wound culture to ensure there's nothing overtly infected. 01/06/18 on evaluation today patient's wound is larger and deeper than previously evaluated. With that being said it did appear that his wound was infected after my last evaluation with him. Subsequently I did end up prescribing a prescription for Bactrim DS which she has been taking and having no complication with. Fortunately there does not appear to be any evidence of infection at this point in time as far as anything spreading, no want to touch, and overall I feel like things are showing signs of improvement. 01/13/18 on evaluation today patient appears to be even a little larger and deeper than last time. There still muscle exposed in the base of the wound. Nonetheless he does appear to be less erythematous I do believe inflammation is calming down also believe the infection looks like it's probably resolved at this time based on what I'm seeing. No fevers, chills, nausea, or vomiting noted at this time. 01/30/18 on evaluation today patient actually appears to visually look better for the most part. Unfortunately those visually this looks better he does seem to potentially have what may be an abscess in the muscle that has been noted in the central portion of the wound. This is the first time that I have noted what appears to be fluctuance in the central portion of the muscle. With that being said I'm somewhat more  concerned about the fact that this might indicate an abscess formation at this location. I do believe that an ultrasound would be appropriate. This is likely something we need to try to do as soon as possible. He has been switch to mupirocin ointment and he is no longer using the steroid ointment as prescribed by dermatology he sees them again next week he's been decreased from 60 to 40 mg of prednisone. 03/09/18 on evaluation today patient actually appears to be doing a little better compared to last time I saw him. There's not as much erythema surrounding the wound itself. He I did review his most recent infectious disease note which was dated 02/24/18. He saw Dr. Michel Bickers in [place]. 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 Patient is now off of antibiotics and they are continually observing this. There seems to be no change in the past two weeks in my pinion based on what the patient says and what I see today compared to what Dr. Megan Salon likely saw two weeks ago. No fevers, chills, nausea, or vomiting noted at this time. 03/23/18 on evaluation today patient's wound actually appears to be showing signs of improvement which is good news. He is currently still on the Dapsone. He is also working on tapering the prednisone to get off of this and Dr. Lottie Rater is working with him in this regard. Nonetheless overall I feel like the wound is doing well it does appear based on the infectious disease note that I reviewed from Dr. 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 I did also review the results of his ultrasound of left lower extremity which revealed there was a dentist tissue in the base of the wound without an abscess noted. 04/06/18 on evaluation today the patient's left lateral lower  extremity ulcer actually appears to be doing fairly well which is excellent news. There does not appear to be any evidence of infection at this time which is also great news. Overall he still does have a significantly large ulceration although little by little he seems to be making progress. He is down to 10 mg a day of the prednisone. 04/20/18 on evaluation today patient actually appears to be doing excellent at this time in regard to his left lower extremity ulcer. He's making signs of good progress unfortunately this is taking much longer than we would really like to see but nonetheless he is making progress. Fortunately there does not appear to be any evidence of infection at this time. No fevers, chills, nausea, or vomiting noted at this time. The patient has not been using the Santyl due to the cost he hadn't got in this field yet. He's mainly been using the antibiotic ointment topically. Subsequently he also tells me that he really has not been scrubbing in the shower I think this would be helpful again as I told him it doesn't have to be anything too aggressive to even make it believe just enough to keep it free of some of the loose slough and biofilm on the wound surface. 05/11/18 on evaluation today patient's wound appears to be making slow but sure progress in regard to the left lateral lower extremity ulcer. He is been tolerating the dressing changes without complication. Fortunately there does not appear to be any evidence of infection at this time. He is still just using triple antibiotic ointment along with clobetasol occasionally over the area. He never got the Santyl and really does not seem to intend to in my pinion. 06/01/18 on evaluation today patient appears to be doing a little better in regard to his left lateral lower extremity ulcer. He states that overall he does not feel like he is doing as well with the Dapsone as he did with the prednisone. Nonetheless he sees his  dermatologist later today and is gonna talk to them about the possibility of going back on the prednisone. Overall again I believe that the wound would be better if you would utilize Santyl but he really does not seem to be interested in going back to the [place] at this point. He has been using triple antibiotic ointment. 06/15/18 on evaluation today patient's wound actually appears to be doing about the same at this point. Fortunately there is no signs of infection at this time. He has made slight improvements although he continues to not really want to clean the wound bed at this point. He states that he just doesn't mess with it he doesn't want to cause any problems with everything else he has going on. He has been on medication, antibiotics as prescribed by his dermatologist, for a staff infection of his lower extremities which is really drying out now and looking much better he tells me. Fortunately there is no sign of overall infection. 06/29/18 on evaluation today patient appears to be doing well in regard to his left lateral lower surety ulcer all things considering. Fortunately his staff  infection seems to be greatly improved compared to previous. He has no signs of infection and this is drying up quite nicely. He is still the doxycycline for this is no longer on cental, Dapsone, or any of the other medications. His dermatologist has recommended possibility of an infusion but right now he does not want to proceed with that. 07/13/18 on evaluation today patient appears to be doing about the same in regard to his left lateral lower surety ulcer. Fortunately there's no signs of infection at this time which is great news. Unfortunately he still builds up a significant amount of Slough/biofilm of the surface of the wound he still is not really cleaning this as he should be appropriately. Again I'm able to easily with saline and gauze remove the majority of this on the surface which if you would do  this at home would likely be a dramatic improvement for him as far as getting the area to improve. Nonetheless overall I still feel like he is making progress is just very slow. I think Santyl will be of benefit for him as well. Still he has not gotten this as of this point. 07/27/18 on evaluation today patient actually appears to be doing little worse in regards of the erythema around the periwound region of the wound he also tells me that he's been having more drainage currently compared to what he was experiencing last time I saw him. He states not quite as bad as what he had because this was infected previously but nonetheless is still appears to be doing poorly. Fortunately there is no evidence of systemic infection at this point. The patient tells me that he is not going to be able to afford the Santyl. He is still waiting to hear about the infusion therapy with his dermatologist. Apparently she wants an updated colonoscopy first. RIDWAN, BONDY ([id number]) 08/10/18 on evaluation today patient appears to be doing better in regard to his left lateral lower extremity ulcer. Fortunately he is showing signs of improvement in this regard he's actually been approved for Remicade infusion's as well although this has not been scheduled as of yet. Fortunately there's no signs of active infection at this time in regard to the wound although he is having some issues with infection of the right lower extremity is been seen as dermatologist for this. Fortunately they are definitely still working with him trying to keep things under control. 09/07/18 on evaluation today patient is actually doing rather well in regard to his left lateral lower extremity ulcer. He notes these actually having some hair grow back on his extremity which is something he has not seen in years. He also tells me that the pain is really not giving them any trouble at this time which is also good news overall she is very pleased with the  progress he's using a combination of the mupirocin along with the probate is all mixed. 09/21/18 on evaluation today patient actually appears to be doing fairly well all things considered in regard to his looks from the ulcer. He's been tolerating the dressing changes without complication. Fortunately there's no signs of active infection at this time which is good news he is still on all antibiotics or prevention of the staff infection. He has been on prednisone for time although he states it is gonna contact his dermatologist and see if she put them on a short course due to some irritation that he has going on currently. Fortunately there's no evidence of any overall  worsening this is going very slow I think cental would be something that would be helpful for him although he states that $50 for tube is quite expensive. He therefore is not willing to get that at this point. 10/06/18 on evaluation today patient actually appears to be doing decently well in regard to his left lateral leg ulcer. He's been tolerating the dressing changes without complication. Fortunately there's no signs of active infection at this time. Overall I'm actually rather pleased with the progress he's making although it's slow he doesn't show any signs of infection and he does seem to be making some improvement. I do believe that he may need a switch up and dressings to try to help this to heal more appropriately and quickly. 10/19/18 on evaluation today patient actually appears to be doing better in regard to his left lateral lower extremity ulcer. This is shown signs of having much less Slough buildup at this point due to the fact he has been using the [employer]. Obviously this is very good news. The overall size of the wound is not dramatically smaller but again the appearance is. 11/02/18 on evaluation today patient actually appears to be doing quite well in regard to his lower [place] ulcer. A lot of the skin around the ulcer  is actually somewhat irritating at this point this seems to be more due to the dressing causing irritation from the adhesive that anything else. Fortunately there is no signs of active infection at this time. 11/24/18 on evaluation today patient appears to be doing a little worse in regard to his overall appearance of his lower extremity ulcer. There's more erythema and warmth around the wound unfortunately. He is currently on doxycycline which he has been on for some time. With that being said I'm not sure that seems to be helping with what appears to possibly be an acute cellulitis with regard to his left lower extremity ulcer. No fevers, chills, nausea, or vomiting noted at this time. 12/08/18 on evaluation today patient's wounds actually appears to be doing significantly better compared to his last evaluation. He has been using Santyl along with alternating tripling about appointment as well as the steroid cream seems to be doing quite well and the wound is showing signs of improvement which is excellent news. Fortunately there's no evidence of infection and in fact his culture came back negative with only normal skin flora noted. 12/21/2018 upon evaluation today patient actually appears to be doing excellent with regard to his ulcer. This is actually the best that I have seen it since have been helping to take care of him. It is both smaller as well as less slough noted on the surface of the wound and seems to be showing signs of good improvement with new skin growing from the edges. He has been using just the triamcinolone he does wonder if he can get a refill of that ointment today. 01/04/2019 upon evaluation today patient actually appears to be doing well with regard to his left lateral lower extremity ulcer. With that being said it does not appear to be that he is doing quite as well as last time as far as progression is concerned. There does not appear to be any signs of infection or significant  irritation which is good news. With that being said I do believe that he may benefit from switching to a collagen based dressing based on how clean The wound appears. 01/18/2019 on evaluation today patient actually appears to be doing well  with regard to his wound on the left lower extremity. He is not made a lot of progress compared to where we were previous but nonetheless does seem to be doing okay at this time which is good news. There is no signs of active infection which is also good news. My only concern currently is I do wish we can get him into utilizing the collagen dressing his insurance would not pay for the supplies that we ordered although it appears that he may be able to order this through his supply company that he typically utilizes. This is [employer]. Nonetheless he did try to order it during the office visit today and it appears this did go through. We will see if he can get that it is a different brand but nonetheless he has collagen and I do think will be beneficial. 02/01/2019 on evaluation today patient actually appears to be doing a little worse today in regard to the overall size of his wounds. Fortunately there is no signs of active infection at this time. That is visually. Nonetheless when this is happened before it was due to infection. For that reason were somewhat concerned about that this time as well. 02/08/2019 on evaluation today patient unfortunately appears to be doing slightly worse with regard to his wound upon evaluation today. Is measuring a little deeper and a little larger unfortunately. I am not really sure exactly what is causing this to enlarge he actually did see his dermatologist she is going to see about initiating Humira for him. Subsequently she also did do steroid injections into the wound itself in the periphery. Nonetheless still nonetheless he seems to be getting a little bit larger he is gone back to just using the steroid cream topically which  I think is appropriate. I would say hold off on the collagen for the time being is definitely a good thing to do. Based on the culture results which we finally did get the final result back regarding it shows staph as the bacteria noted again that can be a normal skin bacteria based on the fact however he is having increased drainage and worsening of the wound measurement wise I would go ahead and place him on an antibiotic today I do believe for this. 02/15/2019 on evaluation today patient actually appears to be doing somewhat better in regard to his ulcer. There is no signs of worsening at this time I did review his culture results which showed evidence of Staphylococcus aureus but not MRSA. Again this could just be more related to the normal skin bacteria although he states the drainage has slowed down quite a bit he may have had a mild infection not just colonization. And was much smaller and then since around10/04/2019 on evaluation today patient appears to be doing unfortunately worse as far as the size of the wound. I really feel like that this is steadily getting larger again it had been doing excellent right at the beginning of September we have seen a steady increase in the area of the wound it is almost 2-1/2 times the size it was on September 1. Obviously this is a bad trend this is not wanting to see. For that reason we went back to using just the topical triamcinolone cream which does seem to help with inflammation. I checked him for bacteria by way of culture and nothing showed positive there. I am considering giving him a short course of a tapering steroid BIRCH, FARINO ([id number]) today to see if  that is can be beneficial for him. The patient is in agreement with giving that a try. 03/08/2019 on evaluation today patient appears to be doing very well in comparison to last evaluation with regard to his lower extremity ulcer. This is showing signs of less inflammation and  actually measuring slightly smaller compared to last time every other week over the past month and a half he has been measuring larger larger larger. Nonetheless I do believe that the issue has been inflammation the prednisone does seem to have been beneficial for him which is good news. No fevers, chills, nausea, vomiting, or diarrhea. 03/22/2019 on evaluation today patient appears to be doing about the same with regard to his leg ulcer. He has been tolerating the dressing changes without complication. With that being said the wound seems to be mostly arrested at its current size but really is not making any progress except for when we prescribed the prednisone. He did show some signs of dropping as far as the overall size of the wound during that interval week. Nonetheless this is something he is not on long-term at this point and unfortunately I think he is getting need either this or else the Humira which his dermatologist has discussed try to get approval for. With that being said he will be seeing his dermatologist on the 11th of this month that is November. 04/19/2019 on evaluation today patient appears to be doing really about the same the wound is measuring slightly larger compared to last time I saw him. He has not been into the office since November 2 due to the fact that he unfortunately had Covid as that his entire family. He tells me that it was rough but they did pull-through and he seems to be doing much better. Fortunately there is no signs of active infection at this time. No fevers, chills, nausea, vomiting, or diarrhea. 05/10/2019 on evaluation today patient unfortunately appears to be doing significantly worse as compared to last time I saw him. He does tell me that he has had his first dose of Humira and actually is scheduled to get the next one in the upcoming week. With that being said he tells me also that in the past several days he has been having a lot of issues with green  drainage she showed me a picture this is more blue-green in color. He is also been having issues with increased sloughy buildup and the wound does appear to be larger today. Obviously this is not the direction that we want everything to take based on the starting of his Humira. Nonetheless I think this is definitely a result of likely infection and to be honest I think this is probably Pseudomonas causing the infection based on what I am seeing. 05/24/2019 on evaluation today patient unfortunately appears to be doing significantly worse compared to his prior evaluation with me 2 weeks ago. I did review his culture results which showed that he does have Staph aureus as well as Pseudomonas noted on the culture. Nonetheless the Levaquin that I prescribed for him does not appear to have been appropriate and in fact he tells me he is no longer experiencing the green drainage and discharge that he had at the last visit. Fortunately there is no signs of active infection at this time which is good news although the wound has significantly worsened it in fact is much deeper than it was previous. We have been utilizing up to this point triamcinolone ointment as the prescription  topical of choice but at this time I really feel like that the wound is getting need to be packed in order to appropriately manage this due to the deeper nature of the wound. Therefore something along the lines of an alginate dressing may be more appropriate. 05/31/2019 upon inspection today patient's wound actually showed signs of doing poorly at this point. Unfortunately he just does not seem to be making any good progress despite what we have tried. He actually did go ahead and pick up the Cipro and start taking that as he was noticing more green drainage he had previously completed the Levaquin that I prescribed for him as well. Nonetheless he missed his appointment for the seventh last week on Wednesday with the wound care center and  [facility] where his dermatologist referred him. Obviously I do think a second opinion would be helpful at this point especially in light of the fact that the patient seems to be doing so poorly despite the fact that we have tried everything that I really know how at this point. The only thing that ever seems to have helped him in the past is when he was on high doses of continual steroids that did seem to make a difference for him. 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 06/07/2019 upon evaluation today patient unfortunately appears to be doing worse yet again with regard to his wound. In fact I am starting to question whether or not he may have a fluid pocket in the muscle at this point based on the bulging and the soft appearance to the central portion of the muscle area. There is not anything draining from the muscle itself at this time which is good news but nonetheless the wound is expanding. I am not really seeing any results of the Humira as far as overall wound progression based on what I am seeing at this point. The patient has been referred for second opinion with regard to his wound to the [facility] wound care center by his dermatologist which I definitely am not in opposition to. Unfortunately we tried multiple dressings in the past including collagen, alginate, and at one point even Hydrofera Blue. With that being said he is never really used it for any significant amount of time due to the fact that he often complains of pain associated with these dressings and then will go back to either using the Santyl which she has done intermittently or more frequently the triamcinolone. He is also using his own compression stockings. We have wrapped him in the past but again that was something else that he really was not a big fan  of. 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 He seemed to do much better when he was on the steroids although honestly I understand that the steroids are not necessarily the best medication to be on long-term obviously 06/14/2019 on evaluation today patient appears to be doing actually a little bit better  with regard to the overall appearance with his leg. Unfortunately he does continue to have issues with what appears to be some fluid underneath the muscle although he did see the wound specialty center at [facility] last week their main goals were to see about infusion therapy in place of the Humira as they feel like that is not quite strong enough. They also recommended that we continue with the treatment otherwise as we are they felt like that was appropriate and they are okay with him continuing to follow-up here with [place] in that regard. With that being said they are also sending him to the vein specialist there to see about vein stripping and if that would be of benefit for him. Subsequently they also did not really address whether or not an ultrasound of the muscle area to see if there is anything that needs to be addressed here would be appropriate or not. For that reason I discussed this with him last week I think we may proceed down that road at this point. 06/21/2019 upon evaluation today patient's wound actually appears to be doing slightly better compared to previous evaluations. I do believe that he has made a difference with regard to the progression here with the use of oral steroids. Again in the past has been the only thing that is really calm things down. He does tell me  that from [facility] is gotten a good news from there that there are no further vein stripping that is necessary at this point. I do not have that available for review today although the patient did relay this to me. He also did obtain and have the ultrasound of the wound completed which I did sign off on today. It does appear that there is no fluid collection under the muscle this is likely then just edematous tissue in general. That is also good news. Overall I still believe the inflammation is the main issue here. He did inquire about the possibility of a wound VAC again with the muscle protruding like it is I am not really sure whether the wound VAC is necessarily ideal or not. That is something we will have to consider although I do believe he may need compression wrapping to try to help with edema control which could potentially be of benefit. 06/28/2019 on evaluation today patient appears to be doing slightly better measurement wise although this is not terribly smaller he least seems to be trending towards that direction. With that being said he still seems to have purulent drainage noted in the wound bed at this time. He has been on Levaquin followed by Cipro over the past month. Unfortunately he still seems to have some issues with active infection at this time. I did perform a culture last week in order to evaluate and see if indeed there was still anything going on. Subsequently the culture did come back Demuro, Heber ([id number]) showing Pseudomonas which is consistent with the drainage has been having which is blue-green in color. He also has had an odor that again was somewhat consistent with Pseudomonas as well. Long story short it appears that the culture showed an intermediate finding with regard to how well the Cipro will work for the Pseudomonas infection. Subsequently being that he does not seem to be clearing up and at best what we are doing is just keeping this at [address] I think he may need  to see infectious disease to discuss IV antibiotic options. 07/05/2019 upon evaluation today patient appears to be  doing okay in regard to his leg ulcer. He has been tolerating the dressing changes at this point without complication. Fortunately there is no signs of active infection at this time which is good news. No fevers, chills, nausea, vomiting, or diarrhea. With that being said he does have an appointment with infectious disease tomorrow and his primary care on Wednesday. Again the reason for the infectious disease referral was due to the fact that he did not seem to be fully resolving with the use of oral antibiotics and therefore we were thinking that IV antibiotic therapy may be necessary secondary to the fact that there was an intermediate finding for how effective the Cipro may be. Nonetheless again he has been having a lot of purulent and even green drainage. Fortunately right now that seems to have calmed down over the past week with the reinitiation of the oral antibiotic. Nonetheless we will see what Dr. Megan Salon has to say. 07/12/2019 upon evaluation today patient appears to be doing about the same at this point in regard to his left lower extremity ulcer. Fortunately there is no signs of active infection at this time which is good news I do believe the Levaquin has been beneficial I did review Dr. Hale Bogus note and to be honest I agree that the patient's leg does appear to be doing better currently. What we found in the past as he does not seem to really completely resolve he will stop the antibiotic and then subsequently things will revert back to having issues with blue-green drainage, increased pain, and overall worsening in general. Obviously that is the reason I sent him back to infectious disease. 07/19/2019 upon evaluation today patient appears to be doing roughly the same in size there is really no dramatic improvement. He has started back on the Levaquin at this point and  though he seems to be doing okay he did still have a lot of blue/green drainage noted on evaluation today unfortunately. I think that this is still indicative more likely of a Pseudomonas infection as previously noted and again he does see Dr. Megan Salon in just a couple of days. I do not know that were really able to effectively clear this with just oral antibiotics alone based on what I am seeing currently. Nonetheless we are still continue to try to manage as best we can with regard to the patient and his wound. I do think the wrap was helpful in decreasing the edema which is excellent news. No fevers, chills, nausea, vomiting, or diarrhea. 07/26/2019 upon evaluation today patient appears to be doing slightly better with regard to the overall appearance of the muscle there is no dark discoloration centrally. Fortunately there is no signs of active infection at this time. No fevers, chills, nausea, vomiting, or diarrhea. Patient's wound bed currently the patient did have an appointment with Dr. Megan Salon at infectious disease last week. With that being said Dr. Megan Salon the patient states was still somewhat hesitant about put him on any IV antibiotics he wanted [place] to repeat cultures today and then see where things go going forward. He does look like Dr. Megan Salon because of some improvement the patient did have with the Levaquin wanted [place] to see about repeating cultures. If it indeed grows the Pseudomonas again then he recommended a possibility of considering a PICC line placement and IV antibiotic therapy. He plans to see the patient back in 1 to 2 weeks. 08/02/2019 upon evaluation today patient appears to be doing poorly with regard to  his left lower extremity. We did get the results of his culture back it shows that he is still showing evidence of Pseudomonas which is consistent with the purulent/blue-green drainage that he has currently. Subsequently the culture also shows that he now is showing  resistance to the oral fluoroquinolones which is unfortunate as that was really the only thing to treat the infection prior. I do believe that he is looking like this is going require IV antibiotic therapy to get this under control. Fortunately there is no signs of systemic infection at this time which is good news. The patient does see Dr. Megan Salon tomorrow. 08/09/2019 upon evaluation today patient appears to be doing better with regard to his left lower extremity ulcer in regard to the overall appearance. He is currently on IV antibiotic therapy. As ordered by Dr. Megan Salon. Currently the patient is on ceftazidime which she is going to take for the next 2 weeks and then follow-up for 4 to 5-week appointment with Dr. Megan Salon. The patient started this this past Friday symptoms have not for a total of 3 days currently in full. 08/16/2019 upon evaluation today patient's wound actually does show muscle in the base of the wound but in general does appear to be much better as far as the overall evidence of infection is concerned. In fact I feel like this is for the most part cleared up he still on the IV antibiotics he has not completed the full course yet but I think he is doing much better which is excellent news. 08/23/2019 upon evaluation today patient appears to be doing about the same with regard to his wound at this point. He tells me that he still has pain unfortunately. Fortunately there is no evidence of systemic infection at this time which is great news. There is significant muscle protrusion. 09/13/19 upon evaluation today patient appears to be doing about the same in regard to his leg unfortunately. He still has a lot of drainage coming from the ulceration there is still muscle exposed. With that being said the patient's last wound culture still showed an intermediate finding with regard to the Pseudomonas he still having the bluish/green drainage as well. Overall I do not know that the wound has  completely cleared of infection at this point. Fortunately there is no signs of active infection systemically at this point which is good news. 09/20/2019 upon evaluation today patient's wound actually appears to be doing about the same based on what I am seeing currently. I do not see any signs of systemic infection he still does have evidence of some local infection and drainage. He did see Dr. Megan Salon last week and Dr. Megan Salon states that he probably does need a different IV antibiotic although he does not want to put him on this until the patient begins the Remicade infusion which is actually scheduled for about 10 days out from today on 13 May. Following that time Dr. Megan Salon is good to see him back and then will evaluate the feasibility of starting him on the IV antibiotic therapy once again at that point. I do not disagree with this plan I do believe as Dr. Megan Salon stated in his note that I reviewed today that the patient's issue is multifactorial with the pyoderma being 1 aspect of this that were hoping the Remicade will be helpful for her. In the meantime I think the gentamicin is, helping to keep things under decent okay control in regard to the ulcer. 09/27/2019 upon evaluation today patient  appears to be doing about the same with regard to his wound still there is a lot of muscle exposure though he does have some hyper granulation tissue noted around the edge and actually some granulation tissue starting to form over the muscle which is actually good news. Fortunately there is no evidence of active infection which is also good news. His pain is less at this point. 5/21; this is a patient I have not seen in a long time. 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 He has a large wound on the left lateral leg with protruding muscle. He comes in the clinic today showing the same area on his left medial ankle. He says there is been a spot there for some time  although we have not previously defined this. Today he has a clearly defined area with slight amount of skin breakdown surrounded by raised areas with a purplish hue in color. This is not painful he says it is irritated. 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 He still has muscle protruding from the lateral portion of his left leg but fortunately the new area that they were concerned about at his last visit does not appear to have opened at this point. He is currently on Remicade infusions and seems to be doing better in my opinion in fact the wound itself seems to be overall much better. The purplish discoloration that he did have seems to have resolved and I think that is a good sign that hopefully the Remicade is doing its job. He does Valliant, [name] ([id number]) have some biofilm noted over the surface of the wound. 11/01/2019 on evaluation today patient's wound actually appears to be doing excellent at this time. Fortunately there is no evidence of active infection and overall I feel like he is making great progress. The Remicade seems to be due excellent job in my opinion. 11/08/19 evaluation today vision actually appears to be doing quite well with regard to his weight ulcer. He's been tolerating dressing changes without complication. Fortunately there is no evidence of infection. No fevers, chills, nausea, or vomiting noted at this time. Overall states that is having more itching than pain which is actually a good sign in my opinion. 12/13/2019 upon evaluation today patient appears to be doing well today with regard to his wound. He has been tolerating the dressing changes without complication. Fortunately there is no sign of active infection at this time. No fevers, chills, nausea, vomiting, or diarrhea. Overall I feel like the infusion therapy has been very beneficial for  him. 01/06/2020 on evaluation today patient appears to be doing well with regard to his wound. This is measuring smaller and actually looks to be doing better. Fortunately there is no signs of active infection at this point. No fevers, chills, nausea, vomiting, or diarrhea. With that being said he does still have the blue-green drainage but this does not seem to be causing any significant issues currently. He has been using the gentamicin that does seem to be keeping things under decent control at this point. He goes later this morning for his next infusion therapy for the pyoderma which seems to also be very beneficial. 02/07/2020 on evaluation today patient appears to be doing about the same in regard to his wounds currently. Fortunately there is no signs of active infection systemically he does still have evidence of local infection still using  gentamicin. He also is showing some signs of improvement albeit slowly I do feel like we are making some progress here. 02/21/2020 upon evaluation today patient appears to be making some signs of improvement the wound is measuring a little bit smaller which is great news and overall I am very pleased with where he stands currently. He is going to be having infusion therapy treatment on the 15th of this month. Fortunately there is no signs of active infection at this time. 03/13/2020 I do believe patient's wound is actually showing some signs of improvement here which is great news. He has continue with the infusion therapy through rheumatology/dermatology at [facility]. That does seem to be beneficial. I still think he gets as much benefit from this as he did from the prednisone initially but nonetheless obviously this is less harsh on his body that the prednisone as far as they are concerned. 03/31/2020 on evaluation today patient's wound actually showing signs of some pretty good improvement in regard to the overall appearance of the wound bed. There is still  muscle exposed though he does have some epithelial growth around the edges of the wound. Fortunately there is no signs of active infection at this time. No fevers, chills, nausea, vomiting, or diarrhea. 04/24/2020 upon evaluation today patient appears to be doing about the same in regard to his leg ulcer. He has been tolerating the dressing changes without complication. Fortunately there is no signs of active infection at this time. No fevers, chills, nausea, vomiting, or diarrhea. With that being said he still has a lot of irritation from the bandaging around the edges of the wound. We did discuss today the possibility of a referral to plastic surgery. 05/22/2020 on evaluation today patient appears to be doing well with regard to his wounds all things considered. He has not been able to get the Chantix apparently there is a recall nurse that I was unaware of put out by [employer] involuntarily. Nonetheless for now I am and I have to do some research into what may be the best option for him to help with quitting in regard to smoking and we discussed that today. Objective Constitutional Obese and well-hydrated in no acute distress. Vitals Time Taken: 8:12 AM, Height: 71 in, Weight: 338 lbs, BMI: 47.1, Temperature: 98.6 F, Pulse: 91 bpm, Respiratory Rate: 18 breaths/min, Blood Pressure: 171/93 mmHg. Respiratory normal breathing without difficulty. Psychiatric this patient is able to make decisions and demonstrates good insight into disease process. Alert and Oriented x 3. pleasant and cooperative. General Notes: Inspection patient's wound bed actually does not show signs of being too bad currently which is good news. Fortunately there is no evidence of active infection which is great news and overall very pleased regard. With that being said it is still very slow to heal obviously. I think that a plastic surgery consult could be beneficial but again we need to help with getting him stop with regard to  smoking before we get to that point. Integumentary (Hair, Skin) Wound #1 status is Open. Original cause of wound was Gradually Appeared. The wound is located on the Left,Lateral Lower Leg. The wound measures 5.5cm length x 6.5cm width x 0.3cm depth; 28.078cm^2 area and 8.423cm^3 volume. There is muscle and Fat Layer (Subcutaneous Tissue) exposed. There is no tunneling or undermining noted. There is a large amount of purulent drainage noted. The wound margin is epibole. There is medium (34-66%) red granulation within the wound bed. There is a small (1-33%) amount of  necrotic tissue within the wound bed Landeck, Gyasi ([id number]) including [place]. 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 Wound Cleansing/Bathing/Shower/Hygiene: Clean wound with Normal Saline. May shower without dressing. Gently cleanse wound with antibacterial soap, rinse and pat dry prior to dressing wounds Peri-Wound Care: Wound #1 Left,Lateral Lower Leg: Vitamin AandD Ointment Medication/ointment applied directly to wound bed: Wound #1 Left,Lateral Lower Leg: Gentamicin Sulfate Cream Triamcinolone Acetonide Ointment (TCA) Primary Wound Dressing: Wound #1 Left,Lateral Lower Leg: Boardered Foam Dressing Dressing Change Frequency: Wound #1 Left,Lateral Lower Leg: Change dressing every day. Edema Control: Patient to wear own compression stockings. Remove compression stockings every night before going to bed and put on every morning when getting up. Additional Orders / Instructions: Decrease/Stop Smoking 1. Would recommend currently that we going to continue  with the wound care measures as before and the patient is in agreement with the plan this includes the use of a mixture of triamcinolone and gentamicin to the wound bed. Subsequently were then again to be using the AandE ointment around the edges of the wound followed by the bordered foam dressing to cover this is only thing that really seem to work for him. 2. I am also going to look into other options or even generic versions of Chantix that I can utilize to help with smoking cessation the patient states this is really the one thing that helped him in the past when he was able to quit smoking. We will see patient back for reevaluation in 2 weeks here in the clinic. If anything worsens or changes patient will contact our office for additional recommendations. Electronic Signature(s) Signed: 05/22/2020 8:40:06 AM By: Worthy Keeler PA-C Entered By: Worthy Keeler on 05/22/2020 08:40:06 Spielmann, [name] ([id number]) -------------------------------------------------------------------------------- SuperBill Details Patient Name: [name] Date of Service: 05/22/2020 Medical Record Number: [id number] Patient Account Number: [id number] Date of Birth/Sex: [birth year] (40 y.o. 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 I87.2 Venous insufficiency (chronic) (peripheral) L03.116 Cellulitis of left lower limb F17.208 Nicotine dependence, unspecified, with other nicotine-induced disorders Facility Procedures CPT4 Code: 83818403 Description: 99213 - WOUND CARE VISIT-LEV 3 EST PT Modifier: Quantity: 1 Physician Procedures CPT4 Code:  7543606 Description: 77034 - WC PHYS LEVEL 3 - EST PT Modifier: Quantity: 1 CPT4 Code: Description: ICD-10 Diagnosis Description L97.222 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

## 2020-05-26 NOTE — Progress Notes (Signed)
[name], [name] ([id number]) Visit Report for 05/22/2020 Arrival Information Details Patient Name: [name], [name] Date of Service: 05/22/2020 8:00 AM Medical Record Number: [id number] Patient Account Number: [id number] Date of Birth/Sex: Sep 19, 1980 (40 y.o. M) Treating RN: [name] Primary Care [name]: [name] Other Clinician: Referring [name]: [name] Treating [name]/Extender: [name] in Treatment: 180 Visit Information History Since Last Visit All ordered tests and consults were completed: No Patient Arrived: Ambulatory Added or deleted any medications: No Arrival Time: 08:07 Any new allergies or adverse reactions: No Accompanied By: self Had a fall or experienced change in No Transfer Assistance: None activities of daily living that may affect Patient Identification Verified: Yes risk of falls: Secondary Verification Process Completed: Yes Signs or symptoms of abuse/neglect since last visito No Patient Requires Transmission-Based Precautions: No Hospitalized since last visit: No Patient Has Alerts: Yes Implantable device outside of the clinic excluding No cellular tissue based products placed in the center since last visit: Has Dressing in Place as Prescribed: Yes Has Compression in Place as Prescribed: Yes Pain Present Now: No Electronic Signature(s) Signed: 05/26/2020 4:22:48 PM By: [name] Coria RN Entered By: [name] on 05/22/2020 08:12:42 [name] ([id number]) -------------------------------------------------------------------------------- Clinic Level of Care Assessment Details Patient Name: [name] Date of Service: 05/22/2020 8:00 AM Medical Record Number: [id number] Patient Account Number: [id number] Date of Birth/Sex: Mar 12, 1981 (40 y.o. M) Treating RN: [name] Primary Care Moosa Bueche: [name] Other Clinician: Referring [name]: [name] Treating [name]/Extender: [name] in Treatment:  180 Clinic Level of Care Assessment Items TOOL 4 Quantity Score []  - Use when only an EandM is performed on FOLLOW-UP visit 0 ASSESSMENTS - Nursing Assessment / Reassessment X - Reassessment of Co-morbidities (includes updates in patient status) 1 10 X- 1 5 Reassessment of Adherence to Treatment Plan ASSESSMENTS - Wound and Skin Assessment / Reassessment X - Simple Wound Assessment / Reassessment - one wound 1 5 []  - 0 Complex Wound Assessment / Reassessment - multiple wounds []  - 0 Dermatologic / Skin Assessment (not related to wound area) ASSESSMENTS - Focused Assessment []  - Circumferential Edema Measurements - multi extremities 0 []  - 0 Nutritional Assessment / Counseling / Intervention []  - 0 Lower Extremity Assessment (monofilament, tuning fork, pulses) []  - 0 Peripheral Arterial Disease Assessment (using hand held doppler) ASSESSMENTS - Ostomy and/or Continence Assessment and Care []  - Incontinence Assessment and Management 0 []  - 0 Ostomy Care Assessment and Management (repouching, etc.) PROCESS - Coordination of Care X - Simple Patient / Family Education for ongoing care 1 15 []  - 0 Complex (extensive) Patient / Family Education for ongoing care []  - 0 Staff obtains Programmer, systems, Records, Test Results / Process Orders []  - 0 Staff telephones HHA, Nursing Homes / Clarify orders / etc []  - 0 Routine Transfer to another Facility (non-emergent condition) []  - 0 Routine Hospital Admission (non-emergent condition) []  - 0 New Admissions / Biomedical engineer / Ordering NPWT, Apligraf, etc. []  - 0 Emergency Hospital Admission (emergent condition) X- 1 10 Simple Discharge Coordination []  - 0 Complex (extensive) Discharge Coordination PROCESS - Special Needs []  - Pediatric / Minor Patient Management 0 []  - 0 Isolation Patient Management []  - 0 Hearing / Language / Visual special needs []  - 0 Assessment of Community assistance (transportation, D/C planning,  etc.) []  - 0 Additional assistance / Altered mentation []  - 0 Support Surface(s) Assessment (bed, cushion, seat, etc.) INTERVENTIONS - Wound Cleansing / Measurement [name] ([id number]) X- 1 5 Simple Wound Cleansing -  one wound []  - 0 Complex Wound Cleansing - multiple wounds X- 1 5 Wound Imaging (photographs - any number of wounds) []  - 0 Wound Tracing (instead of photographs) X- 1 5 Simple Wound Measurement - one wound []  - 0 Complex Wound Measurement - multiple wounds INTERVENTIONS - Wound Dressings []  - Small Wound Dressing one or multiple wounds 0 []  - 0 Medium Wound Dressing one or multiple wounds X- 1 20 Large Wound Dressing one or multiple wounds []  - 0 Application of Medications - topical []  - 0 Application of Medications - injection INTERVENTIONS - Miscellaneous []  - External ear exam 0 []  - 0 Specimen Collection (cultures, biopsies, blood, body fluids, etc.) []  - 0 Specimen(s) / Culture(s) sent or taken to Lab for analysis []  - 0 Patient Transfer (multiple staff / Civil Service fast streamer / Similar devices) []  - 0 Simple Staple / Suture removal (25 or less) []  - 0 Complex Staple / Suture removal (26 or more) []  - 0 Hypo / Hyperglycemic Management (close monitor of Blood Glucose) []  - 0 Ankle / Brachial Index (ABI) - do not check if billed separately X- 1 5 Vital Signs Has the patient been seen at the hospital within the last three years: Yes Total Score: 85 Level Of Care: New/Established - Level 3 Electronic Signature(s) Signed: 05/22/2020 3:19:28 PM By: [name], [name], RN, [name], [name], [name] Entered By: [name], [name], RN, [name], [name] on 05/22/2020 08:37:45 [name] ([id number]) -------------------------------------------------------------------------------- Encounter Discharge Information Details Patient Name: [name] Date of Service: 05/22/2020 8:00 AM Medical Record Number: [id number] Patient Account Number: [id number] Date of Birth/Sex: Oct 12, 1980 (39 y.o.  M) Treating RN: [name] Primary Care [name]: [name] Other Clinician: Referring [name]: [name] Treating [name]: [name] in Treatment: 180 Encounter Discharge Information Items Discharge Condition: Stable Ambulatory Status: Ambulatory Discharge Destination: Home Transportation: Private Auto Accompanied By: self Schedule Follow-up Appointment: Yes Clinical Summary of Care: Electronic Signature(s) Signed: 05/22/2020 3:19:28 PM By: [name], [name], RN, [name], [name], [name] Entered By: [name], [name], RN, [name], [name] on 05/22/2020 08:44:04 [name] ([id number]) -------------------------------------------------------------------------------- Lower Extremity Assessment Details Patient Name: [name] Date of Service: 05/22/2020 8:00 AM Medical Record Number: [id number] Patient Account Number: [id number] Date of Birth/Sex: 1980/12/17 (39 y.o. M) Treating RN: [name] Primary Care Avrohom Mckelvin: [name] Other Clinician: Referring Vidal Lampkins: [name] Treating Donna Snooks/Extender: [name] in Treatment: 180 Electronic Signature(s) Signed: 05/26/2020 4:22:48 PM By: [name] Coria RN Entered By: [name] on 05/22/2020 08:18:03 [name] ([id number]) -------------------------------------------------------------------------------- Multi Wound Chart Details Patient Name: [name] Date of Service: 05/22/2020 8:00 AM Medical Record Number: [id number] Patient Account Number: [id number] Date of Birth/Sex: 09-20-1980 (39 y.o. M) Treating RN: [name] Primary Care Cillian Gwinner: [name] Other Clinician: Referring Casy Tavano: [name] Treating Evangelia Whitaker/Extender: [name] in Treatment: 180 Vital Signs Height(in): 71 Pulse(bpm): 91 Weight(lbs): 338 Blood Pressure(mmHg): 171/93 Body Mass Index(BMI): 47 Temperature(F): 98.6 Respiratory Rate(breaths/min): 18 Photos: [N/A:N/A] Wound Location: Left, Lateral Lower  Leg N/A N/A Wounding Event: Gradually Appeared N/A N/A Primary Etiology: Pyoderma N/A N/A Comorbid History: Sleep Apnea, Hypertension, Colitis N/A N/A Date Acquired: 11/18/2015 N/A N/A Weeks of Treatment: 180 N/A N/A Wound Status: Open N/A N/A Measurements L x W x D (cm) 5.5x6.5x0.3 N/A N/A Area (cm) : 28.078 N/A N/A Volume (cm) : 8.423 N/A N/A % Reduction in Area: -472.00% N/A N/A % Reduction in Volume: -114.50% N/A N/A Classification: Full Thickness With Exposed N/A N/A Support Structures Exudate Amount: Large N/A N/A Exudate  Type: Purulent N/A N/A Exudate Color: yellow, brown, green N/A N/A Wound Margin: Epibole N/A N/A Granulation Amount: Medium (34-66%) N/A N/A Granulation Quality: Red N/A N/A Necrotic Amount: Small (1-33%) N/A N/A Exposed Structures: Fat Layer (Subcutaneous Tissue): N/A N/A Yes Muscle: Yes Fascia: No Tendon: No Joint: No Bone: No Epithelialization: None N/A N/A Treatment Notes Electronic Signature(s) Signed: 05/22/2020 3:19:28 PM By: [name], [name], RN, [name], [name], [name] Entered By: [name], [name], RN, [name], [name] on 05/22/2020 08:34:45 [name] ([id number]) -------------------------------------------------------------------------------- Multi-Disciplinary Care Plan Details Patient Name: [name] Date of Service: 05/22/2020 8:00 AM Medical Record Number: [id number] Patient Account Number: [id number] Date of Birth/Sex: 07/24/1980 (39 y.o. M) Treating RN: [name] Primary Care Schneur Crowson: [name] Other Clinician: Referring Allessandra Bernardi: [name] Treating Mikai Meints/Extender: [name] in Treatment: 180 Active Inactive Venous Leg Ulcer Nursing Diagnoses: Knowledge deficit related to disease process and management Potential for venous Insuffiency (use before diagnosis confirmed) Goals: Non-invasive venous studies are completed as ordered Date Initiated: 12/11/2016 Target Resolution Date: 03/13/2017 Goal Status: Active Patient will  maintain optimal edema control Date Initiated: 12/11/2016 Target Resolution Date: 03/13/2017 Goal Status: Active Patient/caregiver will verbalize understanding of disease process and disease management Date Initiated: 12/11/2016 Target Resolution Date: 03/13/2017 Goal Status: Active Verify adequate tissue perfusion prior to therapeutic compression application Date Initiated: 12/11/2016 Target Resolution Date: 03/13/2017 Goal Status: Active Interventions: Assess peripheral edema status every visit. Compression as ordered Provide education on venous insufficiency Treatment Activities: Therapeutic compression applied : 12/11/2016 Notes: Wound/Skin Impairment Nursing Diagnoses: Impaired tissue integrity Knowledge deficit related to ulceration/compromised skin integrity Goals: Patient/caregiver will verbalize understanding of skin care regimen Date Initiated: 12/11/2016 Target Resolution Date: 03/13/2017 Goal Status: Active Ulcer/skin breakdown will have a volume reduction of 30% by week 4 Date Initiated: 12/11/2016 Target Resolution Date: 03/13/2017 Goal Status: Active Ulcer/skin breakdown will have a volume reduction of 50% by week 8 Date Initiated: 12/11/2016 Target Resolution Date: 03/13/2017 Goal Status: Active Ulcer/skin breakdown will have a volume reduction of 80% by week 12 Date Initiated: 12/11/2016 Target Resolution Date: 03/13/2017 Goal Status: Active Ulcer/skin breakdown will heal within 14 weeks Date Initiated: 12/11/2016 Target Resolution Date: 03/13/2017 Goal Status: Active JOB, [employer] ([id number]) Interventions: Assess patient/caregiver ability to obtain necessary supplies Assess patient/caregiver ability to perform ulcer/skin care regimen upon admission and as needed Assess ulceration(s) every visit Provide education on ulcer and skin care Treatment Activities: Skin care regimen initiated : 12/11/2016 Topical wound management initiated :  12/11/2016 Notes: Electronic Signature(s) Signed: 05/22/2020 3:19:28 PM By: [name], [name], RN, [name], [name], [name] Entered By: [name], [name], RN, [name], [name] on 05/22/2020 08:34:36 [name] ([id number]) -------------------------------------------------------------------------------- Pain Assessment Details Patient Name: [name] Date of Service: 05/22/2020 8:00 AM Medical Record Number: [id number] Patient Account Number: [id number] Date of Birth/Sex: [birth year] (39 y.o. M) Treating RN: [name] Primary Care Amiylah Anastos: [name] Other Clinician: Referring Tenesha Garza: [name] Treating Pax Reasoner/Extender: [name] in Treatment: 180 Active Problems Location of Pain Severity and Description of Pain Patient Has Paino No Site Locations Pain Management and Medication Current Pain Management: Electronic Signature(s) Signed: 05/26/2020 4:22:48 PM By: [name] Coria RN Entered By: [name] on 05/22/2020 08:14:02 [name] ([id number]) -------------------------------------------------------------------------------- Patient/Caregiver Education Details Patient Name: [name] Date of Service: 05/22/2020 8:00 AM Medical Record Number: [id number] Patient Account Number: [id number] Date of Birth/Gender: 07-04-1980 (39 y.o. M) Treating RN: [name] Primary Care Physician: [name] Other Clinician: Referring Physician: [name] Treating Physician/Extender: [name] in Treatment: 180 Education  Assessment Education Provided To: Patient Education Topics Provided Wound/Skin Impairment: Handouts: Caring for Your Ulcer, Other: continue wound care as prescribed Methods: Demonstration, Explain/Verbal Responses: State content correctly Electronic Signature(s) Signed: 05/22/2020 3:19:28 PM By: [name], [name], RN, [name], [name], [name] Entered By: [name], [name], RN, [name], [name] on 05/22/2020 08:42:51 [name]  ([id number]) -------------------------------------------------------------------------------- Wound Assessment Details Patient Name: [name] Date of Service: 05/22/2020 8:00 AM Medical Record Number: [id number] Patient Account Number: [id number] Date of Birth/Sex: 15-Dec-1980 (39 y.o. M) Treating RN: [name] Primary Care Alizea Pell: [name] Other Clinician: Referring Amila Callies: [name] Treating Shenandoah Yeats/Extender: [name] in Treatment: 180 Wound Status Wound Number: 1 Primary Etiology: Pyoderma Wound Location: Left, Lateral Lower Leg Wound Status: Open Wounding Event: Gradually Appeared Comorbid History: Sleep Apnea, Hypertension, Colitis Date Acquired: 11/18/2015 Weeks Of Treatment: 180 Clustered Wound: No Photos Wound Measurements Length: (cm) 5.5 Width: (cm) 6.5 Depth: (cm) 0.3 Area: (cm) 28.078 Volume: (cm) 8.423 % Reduction in Area: -472% % Reduction in Volume: -114.5% Epithelialization: None Tunneling: No Undermining: No Wound Description Classification: Full Thickness With Exposed Support Structures Wound Margin: Epibole Exudate Amount: Large Exudate Type: Purulent Exudate Color: yellow, brown, green Foul Odor After Cleansing: No Slough/Fibrino Yes Wound Bed Granulation Amount: Medium (34-66%) Exposed Structure Granulation Quality: Red Fascia Exposed: No Necrotic Amount: Small (1-33%) Fat Layer (Subcutaneous Tissue) Exposed: Yes Necrotic Quality: Adherent Slough Tendon Exposed: No Muscle Exposed: Yes Necrosis of Muscle: No Joint Exposed: No Bone Exposed: No Treatment Notes Wound #1 (Left, Lateral Lower Leg) Notes Raylene Miyamoto, BFD JACKY, DROSS ([id number]) Electronic Signature(s) Signed: 05/26/2020 4:22:48 PM By: [name] Coria RN Entered By: [name] on 05/22/2020 08:19:31 [name] ([id number]) -------------------------------------------------------------------------------- Vitals Details Patient Name:  [name] Date of Service: 05/22/2020 8:00 AM Medical Record Number: [id number] Patient Account Number: [id number] Date of Birth/Sex: 1980/10/15 (40 y.o. M) Treating RN: [name] Primary Care Natalea Sutliff: [name] Other Clinician: Referring Katoria Yetman: [name] Treating Jairen Goldfarb/Extender: [name] in Treatment: 180 Vital Signs Time Taken: 08:12 Temperature (F): 98.6 Height (in): 71 Pulse (bpm): 91 Weight (lbs): 338 Respiratory Rate (breaths/min): 18 Body Mass Index (BMI): 47.1 Blood Pressure (mmHg): 171/93 Reference Range: 80 - 120 mg / dl Electronic Signature(s) Signed: 05/26/2020 4:22:48 PM By: [name] Coria RN Entered By: [name] on 05/22/2020 08:16:11

## 2020-06-05 ENCOUNTER — Ambulatory Visit: Payer: 59 | Admitting: Physician Assistant

## 2020-06-08 ENCOUNTER — Other Ambulatory Visit: Payer: Self-pay | Admitting: Primary Care

## 2020-06-08 DIAGNOSIS — E785 Hyperlipidemia, unspecified: Secondary | ICD-10-CM

## 2020-06-21 NOTE — Unmapped (Signed)
[facility] infusion notes

## 2020-06-21 NOTE — Unmapped (Signed)
Called scheduled for 2/15    Thanks   [place]

## 2020-06-22 NOTE — Unmapped (Signed)
Orders faxed

## 2020-06-26 ENCOUNTER — Other Ambulatory Visit: Payer: Self-pay

## 2020-06-26 ENCOUNTER — Encounter: Payer: 59 | Attending: Physician Assistant | Admitting: Physician Assistant

## 2020-06-26 ENCOUNTER — Other Ambulatory Visit: Payer: Self-pay | Admitting: Primary Care

## 2020-06-26 DIAGNOSIS — I872 Venous insufficiency (chronic) (peripheral): Secondary | ICD-10-CM | POA: Insufficient documentation

## 2020-06-26 DIAGNOSIS — L97321 Non-pressure chronic ulcer of left ankle limited to breakdown of skin: Secondary | ICD-10-CM | POA: Insufficient documentation

## 2020-06-26 DIAGNOSIS — L03116 Cellulitis of left lower limb: Secondary | ICD-10-CM | POA: Diagnosis not present

## 2020-06-26 DIAGNOSIS — E119 Type 2 diabetes mellitus without complications: Secondary | ICD-10-CM

## 2020-06-26 DIAGNOSIS — L97222 Non-pressure chronic ulcer of left calf with fat layer exposed: Secondary | ICD-10-CM | POA: Diagnosis not present

## 2020-06-26 NOTE — Telephone Encounter (Signed)
This patient is way overdue for diabetes follow-up and will need to be scheduled before I can refill his insulin. Please let me know when he has been scheduled, needs to be seen soon.

## 2020-06-26 NOTE — Progress Notes (Addendum)
DRAPER, GALLON ([id number]) Visit Report for 06/26/2020 Chief Complaint Document Details Patient Name: [name], [name] Date of Service: 06/26/2020 9:00 AM Medical Record Number: [id number] Patient Account Number: [id number] Date of Birth/Sex: Oct 05, 1980 (40 y.o. 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 M) Treating RN: Carlene Coria Primary Care Provider: [name] Other Clinician: Referring Provider: [name] Treating Provider/Extender: [name] Cliche in Treatment: 185 History of Present Illness HPI Description: 12/04/16; 40 year old man who comes into the clinic today for review of a wound on the posterior left calf. He tells me that is been there for about a year. He is not a diabetic he does smoke half a pack per day. He was seen in the ER on 11/20/16 felt to have cellulitis around the wound and was given clindamycin. An x-ray did not show osteomyelitis. The patient initially tells me that he has a milk allergy that sets off a pruritic itching rash on his lower legs which she scratches incessantly and he thinks that's what may have set up the wound. He has been using various topical antibiotics and ointments without any effect. He works in a [employer] and is on his feet all day. He does not have a prior history of  wounds however he does have the rash on both lower legs the right arm and the ventral aspect of his left arm. These are excoriations and clearly have had scratching however there are of macular looking areas on both legs including a substantial larger area on the right leg. This does not have an underlying open area. There is no blistering. The patient tells me that 2 years ago in [place] in response to the rash on his legs he saw a dermatologist who told him he had a condition which may be pyoderma gangrenosum although I may be putting words into his mouth. He seemed to recognize this. 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 He is not in any treatment for this. He's had no recent travel 12/11/16; the patient arrives today with his wound and roughly the same condition we've been using silver alginate this is a deep punched out wound with some surrounding erythema but no tenderness. Biopsy I did did not show confirmed pyoderma gangrenosum suggested nonspecific inflammation and vasculitis but does not provide an actual description of what was seen by the pathologist. I'm really not able to understand this We have also received information from the patient's dermatologist in [place] notes from April 2016. This was a doctor Agarwal-antal. 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 He was prescribed topical steroid high potency under occlusion which helped but at this point the patient did not have a deep punched out wound. 12/18/16; the patient's wound is larger in terms of surface area however this surface looks better and there is less depth. The surrounding erythema also is better. The patient states that the wrap we put on came off 2 days ago when he has been using his compression stockings.  He we are in the process of getting a dermatology consult. 12/26/16 on evaluation today patient's left lower extremity wound shows evidence of infection with  surrounding erythema noted. He has been tolerating the dressing changes but states that he has noted more discomfort. There is a larger area of erythema surrounding the wound. No fevers, chills, nausea, or vomiting noted at this time. With that being said the wound still does have slough covering the surface. He is not allergic to any medication that he is aware of at this point. In regard to his right lower extremity he had several regions that are erythematous and pruritic he wonders if there's anything we can do to help that. 01/02/17 I reviewed patient's wound culture which was obtained his visit last week. He was placed on doxycycline at that point. Unfortunately that does not appear to be an antibiotic that would likely help with the situation however the pseudomonas noted on culture is sensitive to Cipro. Also unfortunately patient's wound seems to have a large compared to last week's evaluation. Not severely so but there are definitely increased measurements in general. He is continuing to have discomfort as well he writes this to be a seven out of 10. In fact he would prefer me not to perform any debridement today due to the fact that he is having discomfort and considering he has an active infection on the little reluctant to do so anyway. No fevers, chills, nausea, or vomiting noted at this time. 01/08/17; patient seems dermatology on September 5. I suspect dermatology will want the slides from the biopsy I did sent to their pathologist. I'm not sure if there is a way we can expedite that. In any case the culture I did before I left on vacation 3 weeks ago showed Pseudomonas he was given 10 days of Cipro and per her description of her intake nurses is actually somewhat better this week although the wound is quite a bit bigger than I remember the last time I saw this. He still has 3 more days of Cipro 01/21/17; dermatology appointment tomorrow. He has completed the ciprofloxacin for Pseudomonas.  Surface of the wound looks better however he is had some deterioration in the lesions on his right leg. Meantime the left lateral leg wound we will continue with sample 01/29/17; patient had his dermatology appointment but I can't yet see that note. He is completed his antibiotics. The wound is more superficial but considerably larger in circumferential area than when he came in. This is in his left lateral calf. He also has swollen erythematous areas with superficial wounds on the right leg and small papular areas on both arms. There apparently areas in her his upper thighs and buttocks I did not look at those. Dermatology biopsied the right leg. Hopefully will have their input next week. 02/05/17; patient went back to see his dermatologist who told him that he had a "scratching problem" as well as staph. He is now on a 30 day course of doxycycline and I believe she gave him triamcinolone cream to the right leg areas to help with the itching [not exactly sure but probably triamcinolone]. She apparently looked at the left lateral leg wound although this was not rebiopsied and I think felt to be ultimately part of the same pathogenesis. He is using sample border foam and changing nevus himself. He now has a new open area on the right posterior leg which was his biopsy site I don't have any of the dermatology notes  02/12/17; we put the patient in compression last week with SANTYL to the wound on the left leg and the biopsy. Edema is much better and the depth of the wound is now at level of skin. Area is still the same oBiopsy site on the right lateral leg we've also been using santyl with a border foam dressing and he is changing this himself. 02/19/17; Using silver alginate started last week to both the substantial left leg wound and the biopsy site on the right wound. He is tolerating compression well. Has a an appointment with his primary M.D. tomorrow wondering about diuretics although I'm wondering if  the edema problem is actually lymphedema 02/26/17; the patient has been to see his primary doctor Dr. Jerrel Ivory at [facility] our primary [facility]. She started him on Lasix 20 mg and this seems to have helped with the edema. However we are not making substantial change with the left lateral calf wound and inflammation. The biopsy site on the right leg also looks stable but not really all that different. 03/12/17; the patient has been to see vein and vascular Dr. Lucky Cowboy. He has had venous reflux studies I have not reviewed these. I did get a call from his dermatology office. They felt that he might have pathergy based on their biopsy on his right leg which led them to look at the slides of [name], [name] ([id number]) the biopsy I did on the left leg and they wonder whether this represents pyoderma gangrenosum which was the original supposition in a man with ulcerative colitis albeit inactive for many years. They therefore recommended clobetasol and tetracycline i.e. 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 03/26/17; apparently the patient just had reflux studies not an appointment with Dr. dew. She arrives in clinic today having applied clobetasol for 2-3 weeks. He notes over the last 2-3 days excessive drainage having to change the dressing 3-4 times a day and also expanding erythema. 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 Lillia Carmel at [facility] dermatology locally who kindly saw him at my request. 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 My concern was the degree of erythema medially extending from his left leg wound which was either inflammation from pyoderma or cellulitis. I  put him on Augmentin however culture of the wound showed Pseudomonas which is quinolone sensitive. I really don't believe he has cellulitis however in view of everything I will continue and give him a course of Cipro. He is also on doxycycline as an immune modulator for the pyoderma. In addition to his original wound on the left lateral leg with surrounding erythema he has a wound on the right posterior calf which was an original biopsy site done by dermatology. 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 Saw Dr. Lillia Carmel yesterday. He has been using topical antibiotics to both wound areas his original wound on the left and the biopsies/pathergy area on the right. There is definitely some improvement in the inflammation around the wound on the right although the patient states he has increasing sensitivity of the wounds. He is on prednisone 60 and doxycycline 1 as prescribed by Dr. Lillia Carmel. He is covering the topical antibiotic with gauze  and putting this in his own compression stocks and changing this daily. He states that Dr. Lottie Rater did a culture of the left leg wound yesterday 05/07/17; pyoderma gangrenosum. The patient saw Dr. Lillia Carmel yesterday and has a follow-up with her in one month. He is still using topical antibiotics to both wounds although he can't recall exactly what type. He is still on prednisone 60 mg. Dr. Lillia Carmel stated that the doxycycline could stop if we were in agreement. 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 The right medial leg was induced by biopsy/pathergy. The area on the right is essentially healed. 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 The right medial leg has closed and remains closed. He is still on  prednisone 60. oHe tells me he missed his last dermatology appointment with Dr. Lillia Carmel but will make another appointment. He reports that her blood sugar at a recent screen in [place] was high 200's. He was 180 today. 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 I think he is going to require still much longer prednisone perhaps another 3 months before attempting to taper. In the meantime his wound is a lot better. Smaller. He is cleaning this off daily and applying topical antibiotics. When he was last in the clinic I thought about changing to [facility] and actually put in a couple of calls to dermatology although probably not during their business hours. 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 He continues on oral prednisone. He has been using triple antibiotic ointment. There is surface debris and we will transition to [facility] and have him return in 2 weeks. He has lost 30 pounds since his last appointment with lifestyle modification. He may benefit from topical steroid cream for treatment this can be considered at a later date. 08/22/17 on evaluation today patient appears to actually be doing rather well in regard to his left lateral lower extremity ulcer. He has actually been managed by Dr. Dellia Nims most recently. Patient is currently on oral steroids at this time. This seems to have been of benefit for him. Nonetheless his last visit was actually with Leah on 08/06/17. Currently he is not utilizing any topical steroid creams although this could be of benefit as well. No fevers, chills, nausea, or vomiting noted at this time. 09/05/17 on evaluation today patient appears to be doing better in regard to his left lateral lower extremity ulcer. He has been tolerating the dressing changes without complication. He is using Santyl with good effect. Overall I'm very  pleased with how things are standing at this point. Patient likewise is happy that this is doing better. 09/19/17 on evaluation today patient actually appears to be doing rather well in regard to his left lateral lower extremity ulcer. 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 He's not having any significant pain. 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 He has been tolerating the Santyl without complication and in general I feel like he's making good progress. 10/17/17 on evaluation today patient  appears to be doing very well in regard to his left lateral lower surety ulcer. He has been tolerating the dressing changes without complication. There does not appear to be any evidence of infection he's alternating the Santyl and the triple antibiotic ointment every other day this seems to be doing well for him. 11/03/17 on evaluation today patient appears to be doing very well in regard to his left lateral lower extremity ulcer. He is been tolerating the dressing changes without complication which is good news. Fortunately there does not appear to be any evidence of infection which is also great news. Overall is doing excellent they are starting to taper down on the prednisone is down to 40 mg at this point it also started topical clobetasol for him. 11/17/17 on evaluation today patient appears to be doing well in regard to his left lateral lower surety ulcer. He's been tolerating the dressing changes without complication. He does note that he is having no pain, no excessive drainage or discharge, and overall he feels like things are going about how he would expect and hope they would. Overall he seems to have no evidence of infection at this time in my opinion which is good news. 12/04/17-He is seen in follow-up evaluation  for right lateral lower extremity ulcer. He has been applying topical steroid cream. Today's measurement show slight increase in size. Over the next 2 weeks we will transition to every other day Santyl and steroid cream. He has been encouraged to monitor for changes and notify clinic with any concerns 12/15/17 on evaluation today patient's left lateral motion the ulcer and fortunately is doing worse again at this point. This just since last week to this week has close to doubled in size according to the patient. I did not seeing last week's I do not have a visual to compare this to in our system was also down so we do not have all the charts and at this point. Nonetheless it does have me somewhat concerned in regard to the fact that again he was worried enough about it he has contact the dermatology that placed them back on the full strength, 50 mg a day of the prednisone that he was taken previous. He continues to alternate using clobetasol along with Santyl at this point. He is obviously somewhat frustrated. 12/22/17 on evaluation today patient appears to be doing a little worse compared to last evaluation. Unfortunately the wound is a little deeper and slightly larger than the last week's evaluation. With that being said he has made some progress in regard to the irritation surrounding at this time unfortunately despite that progress that's been made he still has a significant issue going on here. 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 [name], [name] ([id number]) For that reason I discussed with him today the possibility of perform a wound culture to ensure there's nothing overtly infected. 01/06/18 on evaluation today patient's wound is larger and deeper than previously evaluated. With that being said it did appear that his wound was infected after  my last evaluation with him. Subsequently I did end up prescribing a prescription for Bactrim DS which she has been taking and having no complication with. Fortunately there does not appear to be any evidence of infection at this point in time as far as anything spreading, no want to touch, and overall I feel like  things are showing signs of improvement. 01/13/18 on evaluation today patient appears to be even a little larger and deeper than last time. There still muscle exposed in the base of the wound. Nonetheless he does appear to be less erythematous I do believe inflammation is calming down also believe the infection looks like it's probably resolved at this time based on what I'm seeing. No fevers, chills, nausea, or vomiting noted at this time. 01/30/18 on evaluation today patient actually appears to visually look better for the most part. Unfortunately those visually this looks better he does seem to potentially have what may be an abscess in the muscle that has been noted in the central portion of the wound. This is the first time that I have noted what appears to be fluctuance in the central portion of the muscle. With that being said I'm somewhat more concerned about the fact that this might indicate an abscess formation at this location. I do believe that an ultrasound would be appropriate. This is likely something we need to try to do as soon as possible. He has been switch to mupirocin ointment and he is no longer using the steroid ointment as prescribed by dermatology he sees them again next week he's been decreased from 60 to 40 mg of prednisone. 03/09/18 on evaluation today patient actually appears to be doing a little better compared to last time I saw him. There's not as much erythema surrounding the wound itself. He I did review his most recent infectious disease note which was dated 02/24/18. He saw Dr. Michel Bickers in [place]. 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 Patient is now off of antibiotics and they are continually observing this. There seems to be no change in the past two weeks in my pinion based on what the patient says and what I see today compared to what Dr. Megan Salon likely saw two weeks ago. No fevers, chills, nausea, or vomiting noted at this time. 03/23/18 on evaluation today patient's wound actually appears to be showing signs of improvement which is good news. He is currently still on the Dapsone. He is also working on tapering the prednisone to get off of this and Dr. Lottie Rater is working with him in this regard. Nonetheless overall I feel like the wound is doing well it does appear based on the infectious disease note that I reviewed from Dr. 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 I did also review the results of his ultrasound of left lower extremity which revealed there was a dentist tissue in the base of the wound without an abscess noted. 04/06/18 on evaluation today the patient's left lateral lower extremity ulcer actually appears to be doing fairly well which is excellent news. There does not appear to be any evidence of infection at this time which is also great news. Overall he still does have a significantly large ulceration although little by little he seems to be making progress. He is down to 10 mg a day of the prednisone. 04/20/18 on evaluation today patient actually appears to be doing excellent at this time in regard to his left lower extremity ulcer. He's making signs of good progress unfortunately this is taking much longer than we would really like to see but nonetheless he is  making progress. Fortunately there does not appear to be any evidence of infection at this time. No fevers, chills, nausea, or vomiting noted at this time.  The patient has not been using the Santyl due to the cost he hadn't got in this field yet. He's mainly been using the antibiotic ointment topically. Subsequently he also tells me that he really has not been scrubbing in the shower I think this would be helpful again as I told him it doesn't have to be anything too aggressive to even make it believe just enough to keep it free of some of the loose slough and biofilm on the wound surface. 05/11/18 on evaluation today patient's wound appears to be making slow but sure progress in regard to the left lateral lower extremity ulcer. He is been tolerating the dressing changes without complication. Fortunately there does not appear to be any evidence of infection at this time. He is still just using triple antibiotic ointment along with clobetasol occasionally over the area. He never got the Santyl and really does not seem to intend to in my pinion. 06/01/18 on evaluation today patient appears to be doing a little better in regard to his left lateral lower extremity ulcer. He states that overall he does not feel like he is doing as well with the Dapsone as he did with the prednisone. Nonetheless he sees his dermatologist later today and is gonna talk to them about the possibility of going back on the prednisone. Overall again I believe that the wound would be better if you would utilize Santyl but he really does not seem to be interested in going back to the [facility] at this point. He has been using triple antibiotic ointment. 06/15/18 on evaluation today patient's wound actually appears to be doing about the same at this point. Fortunately there is no signs of infection at this time. He has made slight improvements although he continues to not really want to clean the wound bed at this point. He states that he just doesn't mess with it he doesn't want to cause any problems with everything else he has going on. He has been on medication, antibiotics as prescribed  by his dermatologist, for a staff infection of his lower extremities which is really drying out now and looking much better he tells me. Fortunately there is no sign of overall infection. 06/29/18 on evaluation today patient appears to be doing well in regard to his left lateral lower surety ulcer all things considering. Fortunately his staff infection seems to be greatly improved compared to previous. He has no signs of infection and this is drying up quite nicely. He is still the doxycycline for this is no longer on cental, Dapsone, or any of the other medications. His dermatologist has recommended possibility of an infusion but right now he does not want to proceed with that. 07/13/18 on evaluation today patient appears to be doing about the same in regard to his left lateral lower surety ulcer. Fortunately there's no signs of infection at this time which is great news. Unfortunately he still builds up a significant amount of Slough/biofilm of the surface of the wound he still is not really cleaning this as he should be appropriately. Again I'm able to easily with saline and gauze remove the majority of this on the surface which if you would do this at home would likely be a dramatic improvement for him as far as getting the area to improve. Nonetheless overall I still feel like he  is making progress is just very slow. I think Santyl will be of benefit for him as well. Still he has not gotten this as of this point. 07/27/18 on evaluation today patient actually appears to be doing little worse in regards of the erythema around the periwound region of the wound he also tells me that he's been having more drainage currently compared to what he was experiencing last time I saw him. He states not quite as bad as what he had because this was infected previously but nonetheless is still appears to be doing poorly. Fortunately there is no evidence of systemic infection at this point. The patient tells me that  he is not going to be able to afford the Santyl. He is still waiting to hear about the infusion therapy with his dermatologist. Apparently she wants an updated colonoscopy first. 08/10/18 on evaluation today patient appears to be doing better in regard to his left lateral lower extremity ulcer. Fortunately he is showing signs of improvement in this regard he's actually been approved for Remicade infusion's as well although this has not been scheduled as of yet. Fortunately there's no signs of active infection at this time in regard to the wound although he is having some issues with infection of the right lower extremity is been seen as dermatologist for this. Fortunately they are definitely still working with him trying to keep things under control. [name], [name] ([id number]) 09/07/18 on evaluation today patient is actually doing rather well in regard to his left lateral lower extremity ulcer. He notes these actually having some hair grow back on his extremity which is something he has not seen in years. He also tells me that the pain is really not giving them any trouble at this time which is also good news overall she is very pleased with the progress he's using a combination of the mupirocin along with the probate is all mixed. 09/21/18 on evaluation today patient actually appears to be doing fairly well all things considered in regard to his looks from the ulcer. He's been tolerating the dressing changes without complication. Fortunately there's no signs of active infection at this time which is good news he is still on all antibiotics or prevention of the staff infection. He has been on prednisone for time although he states it is gonna contact his dermatologist and see if she put them on a short course due to some irritation that he has going on currently. Fortunately there's no evidence of any overall worsening this is going very slow I think cental would be something that would be helpful for him  although he states that $50 for tube is quite expensive. He therefore is not willing to get that at this point. 10/06/18 on evaluation today patient actually appears to be doing decently well in regard to his left lateral leg ulcer. He's been tolerating the dressing changes without complication. Fortunately there's no signs of active infection at this time. Overall I'm actually rather pleased with the progress he's making although it's slow he doesn't show any signs of infection and he does seem to be making some improvement. I do believe that he may need a switch up and dressings to try to help this to heal more appropriately and quickly. 10/19/18 on evaluation today patient actually appears to be doing better in regard to his left lateral lower extremity ulcer. This is shown signs of having much less Slough buildup at this point due to the fact he has been using  the Santyl. Obviously this is very good news. The overall size of the wound is not dramatically smaller but again the appearance is. 11/02/18 on evaluation today patient actually appears to be doing quite well in regard to his lower [place] ulcer. A lot of the skin around the ulcer is actually somewhat irritating at this point this seems to be more due to the dressing causing irritation from the adhesive that anything else. Fortunately there is no signs of active infection at this time. 11/24/18 on evaluation today patient appears to be doing a little worse in regard to his overall appearance of his lower extremity ulcer. There's more erythema and warmth around the wound unfortunately. He is currently on doxycycline which he has been on for some time. With that being said I'm not sure that seems to be helping with what appears to possibly be an acute cellulitis with regard to his left lower extremity ulcer. No fevers, chills, nausea, or vomiting noted at this time. 12/08/18 on evaluation today patient's wounds actually appears to be doing  significantly better compared to his last evaluation. He has been using Santyl along with alternating tripling about appointment as well as the steroid cream seems to be doing quite well and the wound is showing signs of improvement which is excellent news. Fortunately there's no evidence of infection and in fact his culture came back negative with only normal skin flora noted. 12/21/2018 upon evaluation today patient actually appears to be doing excellent with regard to his ulcer. This is actually the best that I have seen it since have been helping to take care of him. It is both smaller as well as less slough noted on the surface of the wound and seems to be showing signs of good improvement with new skin growing from the edges. He has been using just the triamcinolone he does wonder if he can get a refill of that ointment today. 01/04/2019 upon evaluation today patient actually appears to be doing well with regard to his left lateral lower extremity ulcer. With that being said it does not appear to be that he is doing quite as well as last time as far as progression is concerned. There does not appear to be any signs of infection or significant irritation which is good news. With that being said I do believe that he may benefit from switching to a collagen based dressing based on how clean The wound appears. 01/18/2019 on evaluation today patient actually appears to be doing well with regard to his wound on the left lower extremity. He is not made a lot of progress compared to where we were previous but nonetheless does seem to be doing okay at this time which is good news. There is no signs of active infection which is also good news. My only concern currently is I do wish we can get him into utilizing the collagen dressing his insurance would not pay for the supplies that we ordered although it appears that he may be able to order this through his supply company that he typically utilizes. This is  [employer]. Nonetheless he did try to order it during the office visit today and it appears this did go through. We will see if he can get that it is a different brand but nonetheless he has collagen and I do think will be beneficial. 02/01/2019 on evaluation today patient actually appears to be doing a little worse today in regard to the overall size of his wounds. Fortunately there  is no signs of active infection at this time. That is visually. Nonetheless when this is happened before it was due to infection. For that reason were somewhat concerned about that this time as well. 02/08/2019 on evaluation today patient unfortunately appears to be doing slightly worse with regard to his wound upon evaluation today. Is measuring a little deeper and a little larger unfortunately. I am not really sure exactly what is causing this to enlarge he actually did see his dermatologist she is going to see about initiating Humira for him. Subsequently she also did do steroid injections into the wound itself in the periphery. Nonetheless still nonetheless he seems to be getting a little bit larger he is gone back to just using the steroid cream topically which I think is appropriate. I would say hold off on the collagen for the time being is definitely a good thing to do. Based on the culture results which we finally did get the final result back regarding it shows staph as the bacteria noted again that can be a normal skin bacteria based on the fact however he is having increased drainage and worsening of the wound measurement wise I would go ahead and place him on an antibiotic today I do believe for this. 02/15/2019 on evaluation today patient actually appears to be doing somewhat better in regard to his ulcer. There is no signs of worsening at this time I did review his culture results which showed evidence of Staphylococcus aureus but not MRSA. Again this could just be more related to the normal skin bacteria  although he states the drainage has slowed down quite a bit he may have had a mild infection not just colonization. And was much smaller and then since around10/04/2019 on evaluation today patient appears to be doing unfortunately worse as far as the size of the wound. I really feel like that this is steadily getting larger again it had been doing excellent right at the beginning of September we have seen a steady increase in the area of the wound it is almost 2-1/2 times the size it was on September 1. Obviously this is a bad trend this is not wanting to see. For that reason we went back to using just the topical triamcinolone cream which does seem to help with inflammation. I checked him for bacteria by way of culture and nothing showed positive there. I am considering giving him a short course of a tapering steroid Dosepak today to see if that is can be beneficial for him. The patient is in agreement with giving that a try. 03/08/2019 on evaluation today patient appears to be doing very well in comparison to last evaluation with regard to his lower extremity ulcer. This is showing signs of less inflammation and actually measuring slightly smaller compared to last time every other week over the past month and a half he has been measuring larger larger larger. Nonetheless I do believe that the issue has been inflammation the prednisone does seem to [name], [name] ([id number]) have been beneficial for him which is good news. No fevers, chills, nausea, vomiting, or diarrhea. 03/22/2019 on evaluation today patient appears to be doing about the same with regard to his leg ulcer. He has been tolerating the dressing changes without complication. With that being said the wound seems to be mostly arrested at its current size but really is not making any progress except for when we prescribed the prednisone. He did show some signs of dropping as far as  the overall size of the wound during that interval week.  Nonetheless this is something he is not on long-term at this point and unfortunately I think he is getting need either this or else the Humira which his dermatologist has discussed try to get approval for. With that being said he will be seeing his dermatologist on the 11th of this month that is November. 04/19/2019 on evaluation today patient appears to be doing really about the same the wound is measuring slightly larger compared to last time I saw him. He has not been into the office since November 2 due to the fact that he unfortunately had Covid as that his entire family. He tells me that it was rough but they did pull-through and he seems to be doing much better. Fortunately there is no signs of active infection at this time. No fevers, chills, nausea, vomiting, or diarrhea. 05/10/2019 on evaluation today patient unfortunately appears to be doing significantly worse as compared to last time I saw him. He does tell me that he has had his first dose of Humira and actually is scheduled to get the next one in the upcoming week. With that being said he tells me also that in the past several days he has been having a lot of issues with green drainage she showed me a picture this is more blue-green in color. He is also been having issues with increased sloughy buildup and the wound does appear to be larger today. Obviously this is not the direction that we want everything to take based on the starting of his Humira. Nonetheless I think this is definitely a result of likely infection and to be honest I think this is probably Pseudomonas causing the infection based on what I am seeing. 05/24/2019 on evaluation today patient unfortunately appears to be doing significantly worse compared to his prior evaluation with me 2 weeks ago. I did review his culture results which showed that he does have Staph aureus as well as Pseudomonas noted on the culture. Nonetheless the Levaquin that I prescribed for him does  not appear to have been appropriate and in fact he tells me he is no longer experiencing the green drainage and discharge that he had at the last visit. Fortunately there is no signs of active infection at this time which is good news although the wound has significantly worsened it in fact is much deeper than it was previous. We have been utilizing up to this point triamcinolone ointment as the prescription topical of choice but at this time I really feel like that the wound is getting need to be packed in order to appropriately manage this due to the deeper nature of the wound. Therefore something along the lines of an alginate dressing may be more appropriate. 05/31/2019 upon inspection today patient's wound actually showed signs of doing poorly at this point. Unfortunately he just does not seem to be making any good progress despite what we have tried. He actually did go ahead and pick up the Cipro and start taking that as he was noticing more green drainage he had previously completed the Levaquin that I prescribed for him as well. Nonetheless he missed his appointment for the seventh last week on Wednesday with the wound care center and [facility] where his dermatologist referred him. Obviously I do think a second opinion would be helpful at this point especially in light of the fact that the patient seems to be doing so poorly despite the fact  that we have tried everything that I really know how at this point. The only thing that ever seems to have helped him in the past is when he was on high doses of continual steroids that did seem to make a difference for him. 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 06/07/2019 upon evaluation today patient unfortunately appears to be doing worse yet again with regard to his wound. In fact I am  starting to question whether or not he may have a fluid pocket in the muscle at this point based on the bulging and the soft appearance to the central portion of the muscle area. There is not anything draining from the muscle itself at this time which is good news but nonetheless the wound is expanding. I am not really seeing any results of the Humira as far as overall wound progression based on what I am seeing at this point. The patient has been referred for second opinion with regard to his wound to the [facility] wound care center by his dermatologist which I definitely am not in opposition to. Unfortunately we tried multiple dressings in the past including collagen, alginate, and at one point even Hydrofera Blue. With that being said he is never really used it for any significant amount of time due to the fact that he often complains of pain associated with these dressings and then will go back to either using the Santyl which she has done intermittently or more frequently the triamcinolone. He is also using his own compression stockings. We have wrapped him in the past but again that was something else that he really was not a big fan of. 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 He seemed to do much better when he was on the steroids although honestly I understand that the steroids are not necessarily the best medication to be on long-term obviously 06/14/2019 on evaluation today patient appears to be doing actually a little bit better with regard to the overall appearance with his leg. Unfortunately he does continue to have issues with what appears to be some fluid underneath the  muscle although he did see the wound specialty center at [facility] last week their main goals were to see about infusion therapy in place of the Humira as they feel like that is not quite strong enough. They also recommended that we continue with the treatment otherwise as we are they felt like that was appropriate and they are okay with him continuing to follow-up here with [place] in that regard. With that being said they are also sending him to the vein specialist there to see about vein stripping and if that would be of benefit for him. Subsequently they also did not really address whether or not an ultrasound of the muscle area to see if there is anything that needs to be addressed here would be appropriate or not. For that reason I discussed this with him last week I think we  may proceed down that road at this point. 06/21/2019 upon evaluation today patient's wound actually appears to be doing slightly better compared to previous evaluations. I do believe that he has made a difference with regard to the progression here with the use of oral steroids. Again in the past has been the only thing that is really calm things down. He does tell me that from [facility] is gotten a good news from there that there are no further vein stripping that is necessary at this point. I do not have that available for review today although the patient did relay this to me. He also did obtain and have the ultrasound of the wound completed which I did sign off on today. It does appear that there is no fluid collection under the muscle this is likely then just edematous tissue in general. That is also good news. Overall I still believe the inflammation is the main issue here. He did inquire about the possibility of a wound VAC again with the muscle protruding like it is I am not really sure whether the wound VAC is necessarily ideal or not. That is something we will have to consider although I do believe he may need compression wrapping to  try to help with edema control which could potentially be of benefit. 06/28/2019 on evaluation today patient appears to be doing slightly better measurement wise although this is not terribly smaller he least seems to be trending towards that direction. With that being said he still seems to have purulent drainage noted in the wound bed at this time. He has been on Levaquin followed by Cipro over the past month. Unfortunately he still seems to have some issues with active infection at this time. I did perform a culture last week in order to evaluate and see if indeed there was still anything going on. Subsequently the culture did come back showing Pseudomonas which is consistent with the drainage has been having which is blue-green in color. He also has had an odor that again was somewhat consistent with Pseudomonas as well. Long story short it appears that the culture showed an intermediate finding with regard to how well the Cipro will work for the Pseudomonas infection. Subsequently being that he does not seem to be clearing up and at best what we are doing is just keeping this at [place] I think he may need to see infectious disease to discuss IV antibiotic options. [name], [name] ([id number]) 07/05/2019 upon evaluation today patient appears to be doing okay in regard to his leg ulcer. He has been tolerating the dressing changes at this point without complication. Fortunately there is no signs of active infection at this time which is good news. No fevers, chills, nausea, vomiting, or diarrhea. With that being said he does have an appointment with infectious disease tomorrow and his primary care on Wednesday. Again the reason for the infectious disease referral was due to the fact that he did not seem to be fully resolving with the use of oral antibiotics and therefore we were thinking that IV antibiotic therapy may be necessary secondary to the fact that there was an intermediate finding for  how effective the Cipro may be. Nonetheless again he has been having a lot of purulent and even green drainage. Fortunately right now that seems to have calmed down over the past week with the reinitiation of the oral antibiotic. Nonetheless we will see what Dr. Megan Salon has to say. 07/12/2019 upon evaluation today patient appears to be  doing about the same at this point in regard to his left lower extremity ulcer. Fortunately there is no signs of active infection at this time which is good news I do believe the Levaquin has been beneficial I did review Dr. Hale Bogus note and to be honest I agree that the patient's leg does appear to be doing better currently. What we found in the past as he does not seem to really completely resolve he will stop the antibiotic and then subsequently things will revert back to having issues with blue-green drainage, increased pain, and overall worsening in general. Obviously that is the reason I sent him back to infectious disease. 07/19/2019 upon evaluation today patient appears to be doing roughly the same in size there is really no dramatic improvement. He has started back on the Levaquin at this point and though he seems to be doing okay he did still have a lot of blue/green drainage noted on evaluation today unfortunately. I think that this is still indicative more likely of a Pseudomonas infection as previously noted and again he does see Dr. Megan Salon in just a couple of days. I do not know that were really able to effectively clear this with just oral antibiotics alone based on what I am seeing currently. Nonetheless we are still continue to try to manage as best we can with regard to the patient and his wound. I do think the wrap was helpful in decreasing the edema which is excellent news. No fevers, chills, nausea, vomiting, or diarrhea. 07/26/2019 upon evaluation today patient appears to be doing slightly better with regard to the overall appearance of the muscle  there is no dark discoloration centrally. Fortunately there is no signs of active infection at this time. No fevers, chills, nausea, vomiting, or diarrhea. Patient's wound bed currently the patient did have an appointment with Dr. Megan Salon at infectious disease last week. With that being said Dr. Megan Salon the patient states was still somewhat hesitant about put him on any IV antibiotics he wanted [place] to repeat cultures today and then see where things go going forward. He does look like Dr. Megan Salon because of some improvement the patient did have with the Levaquin wanted [place] to see about repeating cultures. If it indeed grows the Pseudomonas again then he recommended a possibility of considering a PICC line placement and IV antibiotic therapy. He plans to see the patient back in 1 to 2 weeks. 08/02/2019 upon evaluation today patient appears to be doing poorly with regard to his left lower extremity. We did get the results of his culture back it shows that he is still showing evidence of Pseudomonas which is consistent with the purulent/blue-green drainage that he has currently. Subsequently the culture also shows that he now is showing resistance to the oral fluoroquinolones which is unfortunate as that was really the only thing to treat the infection prior. I do believe that he is looking like this is going require IV antibiotic therapy to get this under control. Fortunately there is no signs of systemic infection at this time which is good news. The patient does see Dr. Megan Salon tomorrow. 08/09/2019 upon evaluation today patient appears to be doing better with regard to his left lower extremity ulcer in regard to the overall appearance. He is currently on IV antibiotic therapy. As ordered by Dr. Megan Salon. Currently the patient is on ceftazidime which she is going to take for the next 2 weeks and then follow-up for 4 to 5-week appointment with Dr. Megan Salon.  The patient started this this past Friday  symptoms have not for a total of 3 days currently in full. 08/16/2019 upon evaluation today patient's wound actually does show muscle in the base of the wound but in general does appear to be much better as far as the overall evidence of infection is concerned. In fact I feel like this is for the most part cleared up he still on the IV antibiotics he has not completed the full course yet but I think he is doing much better which is excellent news. 08/23/2019 upon evaluation today patient appears to be doing about the same with regard to his wound at this point. He tells me that he still has pain unfortunately. Fortunately there is no evidence of systemic infection at this time which is great news. There is significant muscle protrusion. 09/13/19 upon evaluation today patient appears to be doing about the same in regard to his leg unfortunately. He still has a lot of drainage coming from the ulceration there is still muscle exposed. With that being said the patient's last wound culture still showed an intermediate finding with regard to the Pseudomonas he still having the bluish/green drainage as well. Overall I do not know that the wound has completely cleared of infection at this point. Fortunately there is no signs of active infection systemically at this point which is good news. 09/20/2019 upon evaluation today patient's wound actually appears to be doing about the same based on what I am seeing currently. I do not see any signs of systemic infection he still does have evidence of some local infection and drainage. He did see Dr. Megan Salon last week and Dr. Megan Salon states that he probably does need a different IV antibiotic although he does not want to put him on this until the patient begins the Remicade infusion which is actually scheduled for about 10 days out from today on 13 May. Following that time Dr. Megan Salon is good to see him back and then will evaluate the feasibility of starting him on the  IV antibiotic therapy once again at that point. I do not disagree with this plan I do believe as Dr. Megan Salon stated in his note that I reviewed today that the patient's issue is multifactorial with the pyoderma being 1 aspect of this that were hoping the Remicade will be helpful for her. In the meantime I think the gentamicin is, helping to keep things under decent okay control in regard to the ulcer. 09/27/2019 upon evaluation today patient appears to be doing about the same with regard to his wound still there is a lot of muscle exposure though he does have some hyper granulation tissue noted around the edge and actually some granulation tissue starting to form over the muscle which is actually good news. Fortunately there is no evidence of active infection which is also good news. His pain is less at this point. 5/21; this is a patient I have not seen in a long time. 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 He has a large wound on the left lateral leg with protruding muscle. He comes in the clinic today showing the same area on his left medial ankle. He says there is been a spot there for some time although we have not previously defined this. Today he has a clearly defined area with slight amount of skin breakdown surrounded by raised areas with a purplish hue in color. This is not painful he says it is irritated.  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 He still has muscle protruding from the lateral portion of his left leg but fortunately the new area that they were concerned about at his last visit does not appear to have opened at this point. He is currently on Remicade infusions and seems to be doing better in my opinion in fact the wound itself seems to be overall much better. The purplish discoloration that he did have seems to have resolved  and I think that is a good sign that hopefully the Remicade is doing its job. He does have some biofilm noted over the surface of the wound. 11/01/2019 on evaluation today patient's wound actually appears to be doing excellent at this time. Fortunately there is no evidence of active infection and overall I feel like he is making great progress. The Remicade seems to be due excellent job in my opinion. [name], [name] ([id number]) 11/08/19 evaluation today vision actually appears to be doing quite well with regard to his weight ulcer. He's been tolerating dressing changes without complication. Fortunately there is no evidence of infection. No fevers, chills, nausea, or vomiting noted at this time. Overall states that is having more itching than pain which is actually a good sign in my opinion. 12/13/2019 upon evaluation today patient appears to be doing well today with regard to his wound. He has been tolerating the dressing changes without complication. Fortunately there is no sign of active infection at this time. No fevers, chills, nausea, vomiting, or diarrhea. Overall I feel like the infusion therapy has been very beneficial for him. 01/06/2020 on evaluation today patient appears to be doing well with regard to his wound. This is measuring smaller and actually looks to be doing better. Fortunately there is no signs of active infection at this point. No fevers, chills, nausea, vomiting, or diarrhea. With that being said he does still have the blue-green drainage but this does not seem to be causing any significant issues currently. He has been using the gentamicin that does seem to be keeping things under decent control at this point. He goes later this morning for his next infusion therapy for the pyoderma which seems to also be very beneficial. 02/07/2020 on evaluation today patient appears to be doing about the same in regard to his wounds currently. Fortunately there is no signs of active infection  systemically he does still have evidence of local infection still using gentamicin. He also is showing some signs of improvement albeit slowly I do feel like we are making some progress here. 02/21/2020 upon evaluation today patient appears to be making some signs of improvement the wound is measuring a little bit smaller which is great news and overall I am very pleased with where he stands currently. He is going to be having infusion therapy treatment on the 15th of this month. Fortunately there is no signs of active infection at this time. 03/13/2020 I do believe patient's wound is actually showing some signs of improvement here which is great news. He has continue with the infusion therapy through rheumatology/dermatology at [facility]. That does seem to be beneficial. I still think he gets as much benefit from this as he did from the prednisone initially but nonetheless obviously this is less harsh on his body that the prednisone as far as they are concerned. 03/31/2020 on evaluation today patient's wound actually showing signs of some pretty good improvement in regard  to the overall appearance of the wound bed. There is still muscle exposed though he does have some epithelial growth around the edges of the wound. Fortunately there is no signs of active infection at this time. No fevers, chills, nausea, vomiting, or diarrhea. 04/24/2020 upon evaluation today patient appears to be doing about the same in regard to his leg ulcer. He has been tolerating the dressing changes without complication. Fortunately there is no signs of active infection at this time. No fevers, chills, nausea, vomiting, or diarrhea. With that being said he still has a lot of irritation from the bandaging around the edges of the wound. We did discuss today the possibility of a referral to plastic surgery. 05/22/2020 on evaluation today patient appears to be doing well with regard to his wounds all things considered. He has not been able  to get the Chantix apparently there is a recall nurse that I was unaware of put out by [employer] involuntarily. Nonetheless for now I am and I have to do some research into what may be the best option for him to help with quitting in regard to smoking and we discussed that today. 06/26/2020 upon evaluation today patient appears to be doing well with regard to his wound from the standpoint of infection I do not see any signs of infection at this point. With that being said unfortunately he is still continuing to have issues with muscle exposure and again he is not having a whole lot of new skin growth unfortunately. There does not appear to be any signs of active infection at this time. No fevers, chills, nausea, vomiting, or diarrhea. Electronic Signature(s) Signed: 06/26/2020 10:13:46 AM By: Worthy Keeler PA-C Entered By: Worthy Keeler on 06/26/2020 10:13:46 KANYE, DEPREE ([id number]) -------------------------------------------------------------------------------- Physical Exam Details Patient Name: [name] Date of Service: 06/26/2020 9:00 AM Medical Record Number: [id number] Patient Account Number: [id number] Date of Birth/Sex: 25-Apr-1981 (39 y.o. M) Treating RN: Carlene Coria Primary Care Provider: [name] Other Clinician: Referring Provider: [name] Treating Provider/Extender: [name] Cliche in Treatment: 13 Constitutional Well-nourished and well-hydrated in no acute distress. Respiratory normal breathing without difficulty. Psychiatric this patient is able to make decisions and demonstrates good insight into disease process. Alert and Oriented x 3. pleasant and cooperative. Notes Upon inspection patient's wound bed actually showed signs of good granulation epithelization at this point. There does not appear to be any evidence of infection currently which is great news and overall I am extremely pleased with where things stand today. With that being said I do not  think he is making as much progress currently as I would really like to see especially in regard to the new skin growth around the edges. Electronic Signature(s) Signed: 06/26/2020 10:14:17 AM By: Worthy Keeler PA-C Entered By: Worthy Keeler on 06/26/2020 10:14:16 [name] ([id number]) -------------------------------------------------------------------------------- Physician Orders Details Patient Name: [name] Date of Service: 06/26/2020 9:00 AM Medical Record Number: [id number] Patient Account Number: [id number] Date of Birth/Sex: April 15, 1981 (39 y.o. 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 Wound Treatment Wound #1 - Lower Leg Wound Laterality: Left, Lateral Cleanser: Soap and Water 1 x Per Day/30 Days Discharge Instructions: Gently cleanse wound with antibacterial soap, rinse and pat dry prior to dressing wounds Primary Dressing: Hydrofera Blue Ready Transfer Foam, 2.5x2.5 (in/in) (DME) (Generic) 1 x Per Day/30 Days Discharge Instructions: Apply Hydrofera Blue Ready to wound bed as directed Primary Dressing: gentamicin ointment (Generic) 1 x Per Day/30 Days Discharge Instructions: apply to wound bed Secondary Dressing: Bordered Gauze Sterile-HBD 4x4 (in/in) 1 x Per Day/30 Days Discharge Instructions: Cover wound with Bordered Guaze Sterile as directed Patient  Medications Allergies: milk, Biaxin, seasonal Notifications Medication Indication Start End gentamicin 06/26/2020 DOSE topical 0.1 % cream - cream topical applied in a thin film daily to the wound bed as directed in the clinic. Electronic Signature(s) Signed: 06/26/2020 10:30:23 AM By: Worthy Keeler PA-C Entered By: Worthy Keeler on 06/26/2020 10:30:22 CAMDAN, BURDI ([id number]) -------------------------------------------------------------------------------- Problem List Details Patient Name: [name] Date of Service: 06/26/2020 9:00 AM Medical Record Number: [id number] Patient Account Number: [id number] Date of Birth/Sex: May 08, 1981 (39 y.o. M) Treating RN: Carlene Coria Primary Care Provider: [name] Other Clinician: Referring Provider: [name] Treating Provider/Extender: [name] Cliche in Treatment: [phone] Active Problems ICD-10 Encounter Code Description Active Date MDM Diagnosis L97.222 Non-pressure chronic ulcer of left calf with fat layer exposed 12/04/2016 No Yes L88 Pyoderma gangrenosum 03/26/2017 No Yes L97.321 Non-pressure chronic ulcer of left ankle limited to breakdown of skin 10/08/2019 No Yes I87.2 Venous insufficiency (chronic) (peripheral) 12/04/2016 No Yes L03.116 Cellulitis of left lower limb 05/24/2019 No Yes F17.208 Nicotine dependence, unspecified, with other nicotine-induced disorders 04/24/2020 No Yes Inactive Problems ICD-10 Code Description Active Date Inactive Date L97.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 M) Treating RN: Carlene Coria Primary Care Provider: [name] Other Clinician: Referring Provider: [name] Treating Provider/Extender: [name] Cliche in Treatment: 185 Subjective Chief Complaint Information obtained from Patient He is here in follow up evaluation for LLE pyoderma ulcer History of Present Illness (HPI) 12/04/16; 40 year old man who comes into the clinic today for review of a wound on the posterior left calf. He tells me that is been there for about a year. He is not a diabetic he does smoke half a pack per day. He was seen in the ER on 11/20/16 felt to have cellulitis around the wound and was given clindamycin. An x-ray did not show osteomyelitis. The patient initially tells me that he has a milk allergy that sets off a pruritic itching rash on his lower legs which she scratches incessantly and he thinks that's what may have set up the wound. He has been using various topical antibiotics and ointments without any effect. He works in a [employer] and is on his feet all day. He does not have a prior history of wounds however he does have the rash on both lower legs the right arm and the ventral aspect of his left arm. These are excoriations and clearly have had scratching however there are of macular looking areas on both legs including a substantial larger area on the right leg. This does  not have an underlying open area. There is no blistering. The patient tells me that 2 years ago in [place] in response to the rash on his legs he saw a dermatologist who told him he had a condition which may be pyoderma gangrenosum although I may be putting words into his mouth. He seemed to recognize this. 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 He is not in any treatment for this. He's had no recent travel 12/11/16; the patient arrives today with his wound and roughly the same  condition we've been using silver alginate this is a deep punched out wound with some surrounding erythema but no tenderness. Biopsy I did did not show confirmed pyoderma gangrenosum suggested nonspecific inflammation and vasculitis but does not provide an actual description of what was seen by the pathologist. I'm really not able to understand this We have also received information from the patient's dermatologist in [place] notes from April 2016. This was a doctor Agarwal-antal. 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 He was prescribed topical steroid high potency under occlusion which helped but at this point the patient did not have a deep punched out wound. 12/18/16; the patient's wound is larger in terms of surface area however this surface looks better and there is less depth. The surrounding erythema also is better. The patient states that the wrap we put on came off 2 days ago when he has been using his compression stockings. He we are in the process of getting a dermatology consult. 12/26/16 on evaluation today patient's left lower extremity wound shows evidence of infection with surrounding erythema noted. He has been tolerating the dressing changes but states that he has noted more discomfort. There is a larger area of erythema surrounding the wound. No fevers, chills, nausea, or vomiting noted at this time. With that being said the wound still does have slough covering the surface. He is not allergic to any medication that he is aware of at this point. In regard to his right lower extremity he had several regions that are erythematous and pruritic he wonders if there's anything we can do to help that. 01/02/17 I reviewed patient's wound culture which was obtained his visit last week. He was placed on doxycycline at that point. Unfortunately that does not appear to be an antibiotic that would likely help with the situation however the pseudomonas noted on culture is sensitive to  Cipro. Also unfortunately patient's wound seems to have a large compared to last week's evaluation. Not severely so but there are definitely increased measurements in general. He is continuing to have discomfort as well he writes this to be a seven out of 10. In fact he would prefer me not to perform any debridement today due to the fact that he is having discomfort and considering he has an active infection on the little reluctant to do so anyway. No fevers, chills, nausea, or vomiting noted at this time. 01/08/17; patient seems dermatology on September 5. I suspect dermatology will want the slides from the biopsy I did sent to their pathologist. I'm not sure if there is a way we can expedite that. In any case the culture I did before I left on vacation 3 weeks ago showed Pseudomonas he was given 10 days of Cipro and per her description of her intake nurses is actually somewhat better this week although the wound is quite a bit bigger than I remember the last time I saw  this. He still has 3 more days of Cipro 01/21/17; dermatology appointment tomorrow. He has completed the ciprofloxacin for Pseudomonas. Surface of the wound looks better however he is had some deterioration in the lesions on his right leg. Meantime the left lateral leg wound we will continue with sample 01/29/17; patient had his dermatology appointment but I can't yet see that note. He is completed his antibiotics. The wound is more superficial but considerably larger in circumferential area than when he came in. This is in his left lateral calf. He also has swollen erythematous areas with superficial wounds on the right leg and small papular areas on both arms. There apparently areas in her his upper thighs and buttocks I did not look at those. Dermatology biopsied the right leg. Hopefully will have their input next week. 02/05/17; patient went back to see his dermatologist who told him that he had a "scratching problem" as well as staph.  He is now on a 30 day course of doxycycline and I believe she gave him triamcinolone cream to the right leg areas to help with the itching [not exactly sure but probably triamcinolone]. She apparently looked at the left lateral leg wound although this was not rebiopsied and I think felt to be ultimately part of the same pathogenesis. He is using sample border foam and changing nevus himself. He now has a new open area on the right posterior leg which was his biopsy site I don't have any of the dermatology notes 02/12/17; we put the patient in compression last week with SANTYL to the wound on the left leg and the biopsy. Edema is much better and the depth of the wound is now at level of skin. Area is still the same Biopsy site on the right lateral leg we've also been using santyl with a border foam dressing and he is changing this himself. 02/19/17; Using silver alginate started last week to both the substantial left leg wound and the biopsy site on the right wound. He is tolerating compression well. Has a an appointment with his primary M.D. tomorrow wondering about diuretics although I'm wondering if the edema problem is actually lymphedema RONELL, DUFFUS ([id number]) 02/26/17; the patient has been to see his primary doctor Dr. Jerrel Ivory at [facility] our primary care. She started him on Lasix 20 mg and this seems to have helped with the edema. However we are not making substantial change with the left lateral calf wound and inflammation. The biopsy site on the right leg also looks stable but not really all that different. 03/12/17; the patient has been to see vein and vascular Dr. Lucky Cowboy. He has had venous reflux studies I have not reviewed these. I did get a call from his dermatology office. They felt that he might have pathergy based on their biopsy on his right leg which led them to look at the slides of the biopsy I did on the left leg and they wonder whether this represents pyoderma gangrenosum  which was the original supposition in a man with ulcerative colitis albeit inactive for many years. They therefore recommended clobetasol and tetracycline i.e. 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 03/26/17; apparently the patient just had reflux studies not an appointment with Dr. dew. She arrives in clinic today having applied clobetasol for 2-3 weeks. He notes over the last 2-3 days excessive drainage having to change the dressing 3-4 times a day and also expanding erythema. He states the expanding erythema seems to come and go and was last  this red was earlier in the month.he is on doxycycline 150 mg twice a day as an anti-inflammatory systemic therapy for possible pyoderma gangrenosum along with the topical clobetasol 04/02/17; the patient was seen last week by Dr. Lillia Carmel at [facility] dermatology locally who kindly saw him at my request. 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 My concern was the degree of erythema medially extending from his left leg wound which was either inflammation from pyoderma or cellulitis. I put him on Augmentin however culture of the wound showed Pseudomonas which is quinolone sensitive. I really don't believe he has cellulitis however in view of everything I will continue and give him a course of Cipro. He is also on doxycycline as an immune modulator for the pyoderma. In addition to his original wound on the left lateral leg with surrounding erythema he has a wound on the right posterior calf which was an original biopsy site done by dermatology. 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 Saw Dr. Lillia Carmel yesterday. He has been using topical antibiotics to both wound areas his original wound on the left and the biopsies/pathergy area on the right. There is definitely some improvement in the inflammation around the wound on the  right although the patient states he has increasing sensitivity of the wounds. He is on prednisone 60 and doxycycline 1 as prescribed by Dr. Lillia Carmel. He is covering the topical antibiotic with gauze and putting this in his own compression stocks and changing this daily. He states that Dr. Lottie Rater did a culture of the left leg wound yesterday 05/07/17; pyoderma gangrenosum. The patient saw Dr. Lillia Carmel yesterday and has a follow-up with her in one month. He is still using topical antibiotics to both wounds although he can't recall exactly what type. He is still on prednisone 60 mg. Dr. Lillia Carmel stated that the doxycycline could stop if we were in agreement. 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 The right medial leg was induced by biopsy/pathergy. The area on the right is essentially healed. 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 The right medial leg has closed and remains closed. He is still on prednisone 60. He tells me he missed his last dermatology appointment with Dr. Lillia Carmel but will make another appointment. He reports that her blood sugar at a recent screen in [place] was high 200's. He was 180 today. 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 I think he is going to require still much longer prednisone perhaps another 3 months before attempting to taper. In the meantime his wound is a lot better. Smaller. He is cleaning this off daily and applying topical antibiotics. When he was last in the clinic I thought about changing to [facility] and actually put in a couple of calls to dermatology although probably not during their business hours. In any case the wound looks better smaller I don't think there is any need to change what he is doing 08/06/17-he is here in follow up  evaluation for pyoderma left leg ulcer. He continues on oral prednisone. He has been using triple antibiotic ointment. There is surface debris and we will transition to [name] and have him return in 2 weeks. He has lost 30 pounds since his last appointment with lifestyle  modification. He may benefit from topical steroid cream for treatment this can be considered at a later date. 08/22/17 on evaluation today patient appears to actually be doing rather well in regard to his left lateral lower extremity ulcer. He has actually been managed by Dr. Dellia Nims most recently. Patient is currently on oral steroids at this time. This seems to have been of benefit for him. Nonetheless his last visit was actually with Leah on 08/06/17. Currently he is not utilizing any topical steroid creams although this could be of benefit as well. No fevers, chills, nausea, or vomiting noted at this time. 09/05/17 on evaluation today patient appears to be doing better in regard to his left lateral lower extremity ulcer. He has been tolerating the dressing changes without complication. He is using Santyl with good effect. Overall I'm very pleased with how things are standing at this point. Patient likewise is happy that this is doing better. 09/19/17 on evaluation today patient actually appears to be doing rather well in regard to his left lateral lower extremity ulcer. 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 He's not having any significant pain. 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 He has been tolerating the Santyl without complication and in general I feel like he's making good progress. 10/17/17 on evaluation today patient appears to be doing very well in regard to his left lateral lower surety ulcer. He has been tolerating the dressing  changes without complication. There does not appear to be any evidence of infection he's alternating the Santyl and the triple antibiotic ointment every other day this seems to be doing well for him. 11/03/17 on evaluation today patient appears to be doing very well in regard to his left lateral lower extremity ulcer. He is been tolerating the dressing changes without complication which is good news. Fortunately there does not appear to be any evidence of infection which is also great news. Overall is doing excellent they are starting to taper down on the prednisone is down to 40 mg at this point it also started topical clobetasol for him. 11/17/17 on evaluation today patient appears to be doing well in regard to his left lateral lower surety ulcer. He's been tolerating the dressing changes without complication. He does note that he is having no pain, no excessive drainage or discharge, and overall he feels like things are going about how he would expect and hope they would. Overall he seems to have no evidence of infection at this time in my opinion which is good news. 12/04/17-He is seen in follow-up evaluation for right lateral lower extremity ulcer. He has been applying topical steroid cream. Today's measurement show slight increase in size. Over the next 2 weeks we will transition to every other day Santyl and steroid cream. He has been encouraged to monitor for changes and notify clinic with any concerns 12/15/17 on evaluation today patient's left lateral motion the ulcer and fortunately is doing worse again at this point. This just since last week to this week has close to doubled in size according to the patient. I did not seeing last week's I do not have a visual to compare this to in our system was also down so we do not have all the charts and at this point. Nonetheless it does have me somewhat concerned in regard  to the fact that again he was worried enough about it he has contact the  dermatology that placed them back on the full strength, 50 mg a day of the prednisone that he was taken previous. He continues to alternate using clobetasol along with Santyl at this point. He is obviously somewhat frustrated. [name], [name] ([id number]) 12/22/17 on evaluation today patient appears to be doing a little worse compared to last evaluation. Unfortunately the wound is a little deeper and slightly larger than the last week's evaluation. With that being said he has made some progress in regard to the irritation surrounding at this time unfortunately despite that progress that's been made he still has a significant issue going on here. 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 For that reason I discussed with him today the possibility of perform a wound culture to ensure there's nothing overtly infected. 01/06/18 on evaluation today patient's wound is larger and deeper than previously evaluated. With that being said it did appear that his wound was infected after my last evaluation with him. Subsequently I did end up prescribing a prescription for Bactrim DS which she has been taking and having no complication with. Fortunately there does not appear to be any evidence of infection at this point in time as far as anything spreading, no want to touch, and overall I feel like things are showing signs of improvement. 01/13/18 on evaluation today patient appears to be even a little larger and deeper than last time. There still muscle exposed in the base of the wound. Nonetheless he does appear to be less erythematous I do believe inflammation is calming down also believe the infection looks like it's probably resolved at this time based on what I'm seeing. No fevers, chills, nausea, or vomiting noted at this time. 01/30/18 on evaluation today patient  actually appears to visually look better for the most part. Unfortunately those visually this looks better he does seem to potentially have what may be an abscess in the muscle that has been noted in the central portion of the wound. This is the first time that I have noted what appears to be fluctuance in the central portion of the muscle. With that being said I'm somewhat more concerned about the fact that this might indicate an abscess formation at this location. I do believe that an ultrasound would be appropriate. This is likely something we need to try to do as soon as possible. He has been switch to mupirocin ointment and he is no longer using the steroid ointment as prescribed by dermatology he sees them again next week he's been decreased from 60 to 40 mg of prednisone. 03/09/18 on evaluation today patient actually appears to be doing a little better compared to last time I saw him. There's not as much erythema surrounding the wound itself. He I did review his most recent infectious disease note which was dated 02/24/18. He saw Dr. Michel Bickers in [place]. 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 Patient is now off of antibiotics and they are continually observing this. There seems to be no change in the past two weeks in my pinion based on what the patient says and what I see today compared to what Dr. Megan Salon likely saw two weeks ago.  No fevers, chills, nausea, or vomiting noted at this time. 03/23/18 on evaluation today patient's wound actually appears to be showing signs of improvement which is good news. He is currently still on the Dapsone. He is also working on tapering the prednisone to get off of this and Dr. Lottie Rater is working with him in this regard. Nonetheless overall I feel like the wound is doing well it does appear based on the infectious disease note that I reviewed from Dr. 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 I did also review the results of his ultrasound of left lower extremity which revealed there was a dentist tissue in the base of the wound without an abscess noted. 04/06/18 on evaluation today the patient's left lateral lower extremity ulcer actually appears to be doing fairly well which is excellent news. There does not appear to be any evidence of infection at this time which is also great news. Overall he still does have a significantly large ulceration although little by little he seems to be making progress. He is down to 10 mg a day of the prednisone. 04/20/18 on evaluation today patient actually appears to be doing excellent at this time in regard to his left lower extremity ulcer. He's making signs of good progress unfortunately this is taking much longer than we would really like to see but nonetheless he is making progress. Fortunately there does not appear to be any evidence of infection at this time. No fevers, chills, nausea, or vomiting noted at this time. The patient has not been using the Santyl due to the cost he hadn't got in this field yet. He's mainly been using the antibiotic ointment topically. Subsequently he also tells me that he really has not been scrubbing in the shower I think this would be helpful again as I told him it doesn't have to be anything too aggressive to even make it believe just enough to keep it free of some of the loose slough and biofilm on the wound surface. 05/11/18 on evaluation today patient's wound appears to be making slow but sure progress in regard to the left lateral lower extremity ulcer. He is been tolerating the dressing changes without complication. Fortunately there does not appear to be any evidence of infection at this time. He is still just using triple antibiotic ointment along with clobetasol  occasionally over the area. He never got the Santyl and really does not seem to intend to in my pinion. 06/01/18 on evaluation today patient appears to be doing a little better in regard to his left lateral lower extremity ulcer. He states that overall he does not feel like he is doing as well with the Dapsone as he did with the prednisone. Nonetheless he sees his dermatologist later today and is gonna talk to them about the possibility of going back on the prednisone. Overall again I believe that the wound would be better if you would utilize Santyl but he really does not seem to be interested in going back to the [place] at this point. He has been using triple antibiotic ointment. 06/15/18 on evaluation today patient's wound actually appears to be doing about the same at this point. Fortunately there is no signs of infection at this time. He has made slight improvements although he continues to not really want to clean the wound bed at this point. He states that  he just doesn't mess with it he doesn't want to cause any problems with everything else he has going on. He has been on medication, antibiotics as prescribed by his dermatologist, for a staff infection of his lower extremities which is really drying out now and looking much better he tells me. Fortunately there is no sign of overall infection. 06/29/18 on evaluation today patient appears to be doing well in regard to his left lateral lower surety ulcer all things considering. Fortunately his staff infection seems to be greatly improved compared to previous. He has no signs of infection and this is drying up quite nicely. He is still the doxycycline for this is no longer on cental, Dapsone, or any of the other medications. His dermatologist has recommended possibility of an infusion but right now he does not want to proceed with that. 07/13/18 on evaluation today patient appears to be doing about the same in regard to his left lateral lower surety  ulcer. Fortunately there's no signs of infection at this time which is great news. Unfortunately he still builds up a significant amount of Slough/biofilm of the surface of the wound he still is not really cleaning this as he should be appropriately. Again I'm able to easily with saline and gauze remove the majority of this on the surface which if you would do this at home would likely be a dramatic improvement for him as far as getting the area to improve. Nonetheless overall I still feel like he is making progress is just very slow. I think Santyl will be of benefit for him as well. Still he has not gotten this as of this point. 07/27/18 on evaluation today patient actually appears to be doing little worse in regards of the erythema around the periwound region of the wound he also tells me that he's been having more drainage currently compared to what he was experiencing last time I saw him. He states not quite as bad as what he had because this was infected previously but nonetheless is still appears to be doing poorly. Fortunately there is no evidence of systemic infection at this point. The patient tells me that he is not going to be able to afford the Santyl. He is still waiting to hear about the infusion therapy with his dermatologist. Apparently she wants an updated colonoscopy first. [name], [name] ([id number]) 08/10/18 on evaluation today patient appears to be doing better in regard to his left lateral lower extremity ulcer. Fortunately he is showing signs of improvement in this regard he's actually been approved for Remicade infusion's as well although this has not been scheduled as of yet. Fortunately there's no signs of active infection at this time in regard to the wound although he is having some issues with infection of the right lower extremity is been seen as dermatologist for this. Fortunately they are definitely still working with him trying to keep things under control. 09/07/18 on  evaluation today patient is actually doing rather well in regard to his left lateral lower extremity ulcer. He notes these actually having some hair grow back on his extremity which is something he has not seen in years. He also tells me that the pain is really not giving them any trouble at this time which is also good news overall she is very pleased with the progress he's using a combination of the mupirocin along with the probate is all mixed. 09/21/18 on evaluation today patient actually appears to be doing fairly well all things considered  in regard to his looks from the ulcer. He's been tolerating the dressing changes without complication. Fortunately there's no signs of active infection at this time which is good news he is still on all antibiotics or prevention of the staff infection. He has been on prednisone for time although he states it is gonna contact his dermatologist and see if she put them on a short course due to some irritation that he has going on currently. Fortunately there's no evidence of any overall worsening this is going very slow I think cental would be something that would be helpful for him although he states that $50 for tube is quite expensive. He therefore is not willing to get that at this point. 10/06/18 on evaluation today patient actually appears to be doing decently well in regard to his left lateral leg ulcer. He's been tolerating the dressing changes without complication. Fortunately there's no signs of active infection at this time. Overall I'm actually rather pleased with the progress he's making although it's slow he doesn't show any signs of infection and he does seem to be making some improvement. I do believe that he may need a switch up and dressings to try to help this to heal more appropriately and quickly. 10/19/18 on evaluation today patient actually appears to be doing better in regard to his left lateral lower extremity ulcer. This is shown signs  of having much less Slough buildup at this point due to the fact he has been using the [employer]. Obviously this is very good news. The overall size of the wound is not dramatically smaller but again the appearance is. 11/02/18 on evaluation today patient actually appears to be doing quite well in regard to his lower [place] ulcer. A lot of the skin around the ulcer is actually somewhat irritating at this point this seems to be more due to the dressing causing irritation from the adhesive that anything else. Fortunately there is no signs of active infection at this time. 11/24/18 on evaluation today patient appears to be doing a little worse in regard to his overall appearance of his lower extremity ulcer. There's more erythema and warmth around the wound unfortunately. He is currently on doxycycline which he has been on for some time. With that being said I'm not sure that seems to be helping with what appears to possibly be an acute cellulitis with regard to his left lower extremity ulcer. No fevers, chills, nausea, or vomiting noted at this time. 12/08/18 on evaluation today patient's wounds actually appears to be doing significantly better compared to his last evaluation. He has been using Santyl along with alternating tripling about appointment as well as the steroid cream seems to be doing quite well and the wound is showing signs of improvement which is excellent news. Fortunately there's no evidence of infection and in fact his culture came back negative with only normal skin flora noted. 12/21/2018 upon evaluation today patient actually appears to be doing excellent with regard to his ulcer. This is actually the best that I have seen it since have been helping to take care of him. It is both smaller as well as less slough noted on the surface of the wound and seems to be showing signs of good improvement with new skin growing from the edges. He has been using just the triamcinolone he does wonder if  he can get a refill of that ointment today. 01/04/2019 upon evaluation today patient actually appears to be doing well with regard to  his left lateral lower extremity ulcer. With that being said it does not appear to be that he is doing quite as well as last time as far as progression is concerned. There does not appear to be any signs of infection or significant irritation which is good news. With that being said I do believe that he may benefit from switching to a collagen based dressing based on how clean The wound appears. 01/18/2019 on evaluation today patient actually appears to be doing well with regard to his wound on the left lower extremity. He is not made a lot of progress compared to where we were previous but nonetheless does seem to be doing okay at this time which is good news. There is no signs of active infection which is also good news. My only concern currently is I do wish we can get him into utilizing the collagen dressing his insurance would not pay for the supplies that we ordered although it appears that he may be able to order this through his supply company that he typically utilizes. This is [employer]. Nonetheless he did try to order it during the office visit today and it appears this did go through. We will see if he can get that it is a different brand but nonetheless he has collagen and I do think will be beneficial. 02/01/2019 on evaluation today patient actually appears to be doing a little worse today in regard to the overall size of his wounds. Fortunately there is no signs of active infection at this time. That is visually. Nonetheless when this is happened before it was due to infection. For that reason were somewhat concerned about that this time as well. 02/08/2019 on evaluation today patient unfortunately appears to be doing slightly worse with regard to his wound upon evaluation today. Is measuring a little deeper and a little larger unfortunately. I am not really  sure exactly what is causing this to enlarge he actually did see his dermatologist she is going to see about initiating Humira for him. Subsequently she also did do steroid injections into the wound itself in the periphery. Nonetheless still nonetheless he seems to be getting a little bit larger he is gone back to just using the steroid cream topically which I think is appropriate. I would say hold off on the collagen for the time being is definitely a good thing to do. Based on the culture results which we finally did get the final result back regarding it shows staph as the bacteria noted again that can be a normal skin bacteria based on the fact however he is having increased drainage and worsening of the wound measurement wise I would go ahead and place him on an antibiotic today I do believe for this. 02/15/2019 on evaluation today patient actually appears to be doing somewhat better in regard to his ulcer. There is no signs of worsening at this time I did review his culture results which showed evidence of Staphylococcus aureus but not MRSA. Again this could just be more related to the normal skin bacteria although he states the drainage has slowed down quite a bit he may have had a mild infection not just colonization. And was much smaller and then since around10/04/2019 on evaluation today patient appears to be doing unfortunately worse as far as the size of the wound. I really feel like that this is steadily getting larger again it had been doing excellent right at the beginning of September we have seen a steady  increase in the area of the wound it is almost 2-1/2 times the size it was on September 1. Obviously this is a bad trend this is not wanting to see. For that reason we went back to using just the topical triamcinolone cream which does seem to help with inflammation. I checked him for bacteria by way of culture and nothing showed positive there. I am considering giving him a short  course of a tapering steroid [name], [name] ([id number]) today to see if that is can be beneficial for him. The patient is in agreement with giving that a try. 03/08/2019 on evaluation today patient appears to be doing very well in comparison to last evaluation with regard to his lower extremity ulcer. This is showing signs of less inflammation and actually measuring slightly smaller compared to last time every other week over the past month and a half he has been measuring larger larger larger. Nonetheless I do believe that the issue has been inflammation the prednisone does seem to have been beneficial for him which is good news. No fevers, chills, nausea, vomiting, or diarrhea. 03/22/2019 on evaluation today patient appears to be doing about the same with regard to his leg ulcer. He has been tolerating the dressing changes without complication. With that being said the wound seems to be mostly arrested at its current size but really is not making any progress except for when we prescribed the prednisone. He did show some signs of dropping as far as the overall size of the wound during that interval week. Nonetheless this is something he is not on long-term at this point and unfortunately I think he is getting need either this or else the Humira which his dermatologist has discussed try to get approval for. With that being said he will be seeing his dermatologist on the 11th of this month that is November. 04/19/2019 on evaluation today patient appears to be doing really about the same the wound is measuring slightly larger compared to last time I saw him. He has not been into the office since November 2 due to the fact that he unfortunately had Covid as that his entire family. He tells me that it was rough but they did pull-through and he seems to be doing much better. Fortunately there is no signs of active infection at this time. No fevers, chills, nausea, vomiting, or diarrhea. 05/10/2019  on evaluation today patient unfortunately appears to be doing significantly worse as compared to last time I saw him. He does tell me that he has had his first dose of Humira and actually is scheduled to get the next one in the upcoming week. With that being said he tells me also that in the past several days he has been having a lot of issues with green drainage she showed me a picture this is more blue-green in color. He is also been having issues with increased sloughy buildup and the wound does appear to be larger today. Obviously this is not the direction that we want everything to take based on the starting of his Humira. Nonetheless I think this is definitely a result of likely infection and to be honest I think this is probably Pseudomonas causing the infection based on what I am seeing. 05/24/2019 on evaluation today patient unfortunately appears to be doing significantly worse compared to his prior evaluation with me 2 weeks ago. I did review his culture results which showed that he does have Staph aureus as well as Pseudomonas noted  on the culture. Nonetheless the Levaquin that I prescribed for him does not appear to have been appropriate and in fact he tells me he is no longer experiencing the green drainage and discharge that he had at the last visit. Fortunately there is no signs of active infection at this time which is good news although the wound has significantly worsened it in fact is much deeper than it was previous. We have been utilizing up to this point triamcinolone ointment as the prescription topical of choice but at this time I really feel like that the wound is getting need to be packed in order to appropriately manage this due to the deeper nature of the wound. Therefore something along the lines of an alginate dressing may be more appropriate. 05/31/2019 upon inspection today patient's wound actually showed signs of doing poorly at this point. Unfortunately he just does not  seem to be making any good progress despite what we have tried. He actually did go ahead and pick up the Cipro and start taking that as he was noticing more green drainage he had previously completed the Levaquin that I prescribed for him as well. Nonetheless he missed his appointment for the seventh last week on Wednesday with the wound care center and [facility] where his dermatologist referred him. Obviously I do think a second opinion would be helpful at this point especially in light of the fact that the patient seems to be doing so poorly despite the fact that we have tried everything that I really know how at this point. The only thing that ever seems to have helped him in the past is when he was on high doses of continual steroids that did seem to make a difference for him. 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 06/07/2019 upon evaluation today patient unfortunately appears to be doing worse yet again with regard to his wound. In fact I am starting to question whether or not he may have a fluid pocket in the muscle at this point based on the bulging and the soft appearance to the central portion of the muscle area. There is not anything draining from the muscle itself at this time which is good news but nonetheless the wound is expanding. I am not really seeing any results of the Humira as far as overall wound progression based on what I am seeing at this point. The patient has been referred for second opinion with regard to his wound to the [facility] wound care center by his dermatologist which I definitely am not in opposition to. Unfortunately we tried multiple dressings in the past including collagen, alginate, and at one point even Hydrofera Blue. With that being said he is never really used it for any significant amount of time  due to the fact that he often complains of pain associated with these dressings and then will go back to either using the Santyl which she has done intermittently or more frequently the triamcinolone. He is also using his own compression stockings. We have wrapped him in the past but again that was something else that he really was not a big fan of. 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 He seemed to do much better when he was on the steroids although honestly I understand that the steroids are not necessarily the best medication to be on long-term obviously 06/14/2019 on evaluation today patient appears to be doing actually a little bit better with regard to the overall appearance with his leg. Unfortunately he does continue to have issues with what appears to be some fluid underneath the muscle although he did see the wound specialty center at [facility] last week their main goals were to see about infusion therapy in place of the Humira as they feel like that is not quite strong enough. They also recommended that we continue with the treatment otherwise as we are they felt like that was appropriate and they are okay with him continuing to follow-up here with [place] in that regard. With that being said they are also sending him to the vein specialist there to see about vein stripping and if that would be of benefit for him. Subsequently they also did not really address whether or not an ultrasound of the muscle area to see if there is anything that needs to be addressed here would be appropriate or not. For that reason I discussed this with him last week I think we may proceed down that  road at this point. 06/21/2019 upon evaluation today patient's wound actually appears to be doing slightly better compared to previous evaluations. I do believe that he has made a difference with regard to the progression here with the use of oral steroids. Again in the past has been the only thing that is really calm things down. He does tell me that from [facility] is gotten a good news from there that there are no further vein stripping that is necessary at this point. I do not have that available for review today although the patient did relay this to me. He also did obtain and have the ultrasound of the wound completed which I did sign off on today. It does appear that there is no fluid collection under the muscle this is likely then just edematous tissue in general. That is also good news. Overall I still believe the inflammation is the main issue here. He did inquire about the possibility of a wound VAC again with the muscle protruding like it is I am not really sure whether the wound VAC is necessarily ideal or not. That is something we will have to consider although I do believe he may need compression wrapping to try to help with edema control which could potentially be of benefit. 06/28/2019 on evaluation today patient appears to be doing slightly better measurement wise although this is not terribly smaller he least seems to be trending towards that direction. With that being said he still seems to have purulent drainage noted in the wound bed at this time. He has been on Levaquin followed by Cipro over the past month. Unfortunately he still seems to have some issues with active infection at this time. I did perform a culture last week in order to evaluate and see if indeed there was still anything going on. Subsequently the culture did come back [name], [name] ([id number]) showing Pseudomonas which is consistent with the drainage has been having which is blue-green in color. He also  has had an odor  that again was somewhat consistent with Pseudomonas as well. Long story short it appears that the culture showed an intermediate finding with regard to how well the Cipro will work for the Pseudomonas infection. Subsequently being that he does not seem to be clearing up and at best what we are doing is just keeping this at [place] I think he may need to see infectious disease to discuss IV antibiotic options. 07/05/2019 upon evaluation today patient appears to be doing okay in regard to his leg ulcer. He has been tolerating the dressing changes at this point without complication. Fortunately there is no signs of active infection at this time which is good news. No fevers, chills, nausea, vomiting, or diarrhea. With that being said he does have an appointment with infectious disease tomorrow and his primary care on Wednesday. Again the reason for the infectious disease referral was due to the fact that he did not seem to be fully resolving with the use of oral antibiotics and therefore we were thinking that IV antibiotic therapy may be necessary secondary to the fact that there was an intermediate finding for how effective the Cipro may be. Nonetheless again he has been having a lot of purulent and even green drainage. Fortunately right now that seems to have calmed down over the past week with the reinitiation of the oral antibiotic. Nonetheless we will see what Dr. Megan Salon has to say. 07/12/2019 upon evaluation today patient appears to be doing about the same at this point in regard to his left lower extremity ulcer. Fortunately there is no signs of active infection at this time which is good news I do believe the Levaquin has been beneficial I did review Dr. Hale Bogus note and to be honest I agree that the patient's leg does appear to be doing better currently. What we found in the past as he does not seem to really completely resolve he will stop the antibiotic and then subsequently things will revert  back to having issues with blue-green drainage, increased pain, and overall worsening in general. Obviously that is the reason I sent him back to infectious disease. 07/19/2019 upon evaluation today patient appears to be doing roughly the same in size there is really no dramatic improvement. He has started back on the Levaquin at this point and though he seems to be doing okay he did still have a lot of blue/green drainage noted on evaluation today unfortunately. I think that this is still indicative more likely of a Pseudomonas infection as previously noted and again he does see Dr. Megan Salon in just a couple of days. I do not know that were really able to effectively clear this with just oral antibiotics alone based on what I am seeing currently. Nonetheless we are still continue to try to manage as best we can with regard to the patient and his wound. I do think the wrap was helpful in decreasing the edema which is excellent news. No fevers, chills, nausea, vomiting, or diarrhea. 07/26/2019 upon evaluation today patient appears to be doing slightly better with regard to the overall appearance of the muscle there is no dark discoloration centrally. Fortunately there is no signs of active infection at this time. No fevers, chills, nausea, vomiting, or diarrhea. Patient's wound bed currently the patient did have an appointment with Dr. Megan Salon at infectious disease last week. With that being said Dr. Megan Salon the patient states was still somewhat hesitant about put him on any IV antibiotics he  wanted [place] to repeat cultures today and then see where things go going forward. He does look like Dr. Megan Salon because of some improvement the patient did have with the Levaquin wanted [place] to see about repeating cultures. If it indeed grows the Pseudomonas again then he recommended a possibility of considering a PICC line placement and IV antibiotic therapy. He plans to see the patient back in 1 to 2 weeks. 08/02/2019  upon evaluation today patient appears to be doing poorly with regard to his left lower extremity. We did get the results of his culture back it shows that he is still showing evidence of Pseudomonas which is consistent with the purulent/blue-green drainage that he has currently. Subsequently the culture also shows that he now is showing resistance to the oral fluoroquinolones which is unfortunate as that was really the only thing to treat the infection prior. I do believe that he is looking like this is going require IV antibiotic therapy to get this under control. Fortunately there is no signs of systemic infection at this time which is good news. The patient does see Dr. Megan Salon tomorrow. 08/09/2019 upon evaluation today patient appears to be doing better with regard to his left lower extremity ulcer in regard to the overall appearance. He is currently on IV antibiotic therapy. As ordered by Dr. Megan Salon. Currently the patient is on ceftazidime which she is going to take for the next 2 weeks and then follow-up for 4 to 5-week appointment with Dr. Megan Salon. The patient started this this past Friday symptoms have not for a total of 3 days currently in full. 08/16/2019 upon evaluation today patient's wound actually does show muscle in the base of the wound but in general does appear to be much better as far as the overall evidence of infection is concerned. In fact I feel like this is for the most part cleared up he still on the IV antibiotics he has not completed the full course yet but I think he is doing much better which is excellent news. 08/23/2019 upon evaluation today patient appears to be doing about the same with regard to his wound at this point. He tells me that he still has pain unfortunately. Fortunately there is no evidence of systemic infection at this time which is great news. There is significant muscle protrusion. 09/13/19 upon evaluation today patient appears to be doing about the same  in regard to his leg unfortunately. He still has a lot of drainage coming from the ulceration there is still muscle exposed. With that being said the patient's last wound culture still showed an intermediate finding with regard to the Pseudomonas he still having the bluish/green drainage as well. Overall I do not know that the wound has completely cleared of infection at this point. Fortunately there is no signs of active infection systemically at this point which is good news. 09/20/2019 upon evaluation today patient's wound actually appears to be doing about the same based on what I am seeing currently. I do not see any signs of systemic infection he still does have evidence of some local infection and drainage. He did see Dr. Megan Salon last week and Dr. Megan Salon states that he probably does need a different IV antibiotic although he does not want to put him on this until the patient begins the Remicade infusion which is actually scheduled for about 10 days out from today on 13 May. Following that time Dr. Megan Salon is good to see him back and then will evaluate the  feasibility of starting him on the IV antibiotic therapy once again at that point. I do not disagree with this plan I do believe as Dr. Megan Salon stated in his note that I reviewed today that the patient's issue is multifactorial with the pyoderma being 1 aspect of this that were hoping the Remicade will be helpful for her. In the meantime I think the gentamicin is, helping to keep things under decent okay control in regard to the ulcer. 09/27/2019 upon evaluation today patient appears to be doing about the same with regard to his wound still there is a lot of muscle exposure though he does have some hyper granulation tissue noted around the edge and actually some granulation tissue starting to form over the muscle which is actually good news. Fortunately there is no evidence of active infection which is also good news. His pain is less at this  point. 5/21; this is a patient I have not seen in a long time. 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 He has a large wound on the left lateral leg with protruding muscle. He comes in the clinic today showing the same area on his left medial ankle. He says there is been a spot there for some time although we have not previously defined this. Today he has a clearly defined area with slight amount of skin breakdown surrounded by raised areas with a purplish hue in color. This is not painful he says it is irritated. 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 He still has muscle protruding from the lateral portion of his left leg but fortunately the new area that they were concerned about at his last visit does not appear to have opened at this point. He is currently on Remicade infusions and seems to be doing better in my opinion in fact the wound itself seems to be overall much better. The purplish discoloration that he did have seems to have resolved and I think that is a good sign that hopefully the Remicade is doing its job. He does Pederson, Dewitte ([id number]) have some biofilm noted over the surface of the wound. 11/01/2019 on evaluation today patient's wound actually appears to be doing excellent at this time. Fortunately there is no evidence of active infection and overall I feel like he is making great progress. The Remicade seems to be due excellent job in my opinion. 11/08/19 evaluation today vision actually appears to be doing quite well with regard to his weight ulcer. He's been tolerating dressing changes without complication. Fortunately there is no evidence of infection. No fevers, chills, nausea, or vomiting noted at this time. Overall states that is having more itching than pain which is actually a good sign in my opinion. 12/13/2019 upon  evaluation today patient appears to be doing well today with regard to his wound. He has been tolerating the dressing changes without complication. Fortunately there is no sign of active infection at this time. No fevers, chills, nausea, vomiting, or diarrhea. Overall I feel like the infusion therapy has been very beneficial for him. 01/06/2020 on evaluation today patient appears to be doing well with regard to his wound. This is measuring smaller and actually looks to be doing better. Fortunately there is no signs of active infection at this point. No fevers, chills, nausea, vomiting, or diarrhea. With that being said he does still have the blue-green drainage but this does  not seem to be causing any significant issues currently. He has been using the gentamicin that does seem to be keeping things under decent control at this point. He goes later this morning for his next infusion therapy for the pyoderma which seems to also be very beneficial. 02/07/2020 on evaluation today patient appears to be doing about the same in regard to his wounds currently. Fortunately there is no signs of active infection systemically he does still have evidence of local infection still using gentamicin. He also is showing some signs of improvement albeit slowly I do feel like we are making some progress here. 02/21/2020 upon evaluation today patient appears to be making some signs of improvement the wound is measuring a little bit smaller which is great news and overall I am very pleased with where he stands currently. He is going to be having infusion therapy treatment on the 15th of this month. Fortunately there is no signs of active infection at this time. 03/13/2020 I do believe patient's wound is actually showing some signs of improvement here which is great news. He has continue with the infusion therapy through rheumatology[facility]dermatology at [name]. That does seem to be beneficial. I still think he gets as much benefit  from this as he did from the prednisone initially but nonetheless obviously this is less harsh on his body that the prednisone as far as they are concerned. 03/31/2020 on evaluation today patient's wound actually showing signs of some pretty good improvement in regard to the overall appearance of the wound bed. There is still muscle exposed though he does have some epithelial growth around the edges of the wound. Fortunately there is no signs of active infection at this time. No fevers, chills, nausea, vomiting, or diarrhea. 04/24/2020 upon evaluation today patient appears to be doing about the same in regard to his leg ulcer. He has been tolerating the dressing changes without complication. Fortunately there is no signs of active infection at this time. No fevers, chills, nausea, vomiting, or diarrhea. With that being said he still has a lot of irritation from the bandaging around the edges of the wound. We did discuss today the possibility of a referral to plastic surgery. 05/22/2020 on evaluation today patient appears to be doing well with regard to his wounds all things considered. He has not been able to get the Chantix apparently there is a recall nurse that I was unaware of put out by [employer] involuntarily. Nonetheless for now I am and I have to do some research into what may be the best option for him to help with quitting in regard to smoking and we discussed that today. 06/26/2020 upon evaluation today patient appears to be doing well with regard to his wound from the standpoint of infection I do not see any signs of infection at this point. With that being said unfortunately he is still continuing to have issues with muscle exposure and again he is not having a whole lot of new skin growth unfortunately. There does not appear to be any signs of active infection at this time. No fevers, chills, nausea, vomiting, or diarrhea. Objective Constitutional Well-nourished and well-hydrated in no acute  distress. Vitals Time Taken: 9:16 AM, Height: 71 in, Weight: 338 lbs, BMI: 47.1, Temperature: 98.2 F, Pulse: 82 bpm, Respiratory Rate: 18 breaths/min, Blood Pressure: 129/86 mmHg. Respiratory normal breathing without difficulty. Psychiatric this patient is able to make decisions and demonstrates good insight into disease process. Alert and Oriented x 3. pleasant and cooperative.  General Notes: Upon inspection patient's wound bed actually showed signs of good granulation epithelization at this point. There does not appear to be any evidence of infection currently which is great news and overall I am extremely pleased with where things stand today. With that being said I do not think he is making as much progress currently as I would really like to see especially in regard to the new skin growth around the edges. Integumentary (Hair, Skin) [name], [name] ([id number]) Wound #1 status is Open. Original cause of wound was Gradually Appeared. The wound is located on the Left,Lateral Lower Leg. The wound measures 5.6cm length x 6.3cm width x 0.7cm depth; 27.709cm^2 area and 19.396cm^3 volume. There is muscle and Fat Layer (Subcutaneous Tissue) exposed. There is no tunneling or undermining noted. There is a large amount of serosanguineous drainage noted. The wound margin is epibole. There is medium (34-66%) red granulation within the wound bed. There is a medium (34-66%) amount of necrotic tissue within the wound bed including Adherent Slough. 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 The following medication(s) was prescribed: gentamicin topical  0.1 % cream cream topical applied in a thin film daily to the wound bed as directed in the clinic. starting 06/26/2020 WOUND #1: - Lower Leg Wound Laterality: Left, Lateral Cleanser: Soap and Water 1 x Per Day/30 Days Discharge Instructions: Gently cleanse wound with antibacterial soap, rinse and pat dry prior to dressing wounds Primary Dressing: Hydrofera Blue Ready Transfer Foam, 2.5x2.5 (in/in) (DME) (Generic) 1 x Per Day/30 Days Discharge Instructions: Apply Hydrofera Blue Ready to wound bed as directed Primary Dressing: gentamicin ointment (Generic) 1 x Per Day/30 Days Discharge Instructions: apply to wound bed Secondary Dressing: Bordered Gauze Sterile-HBD 4x4 (in/in) 1 x Per Day/30 Days Discharge Instructions: Cover wound with Bordered Guaze Sterile as directed 1. Would recommend currently that we going continue with the wound care measures as before and the patient is in agreement with the plan. This includes the use of the gentamicin ointment along with the triamcinolone. 2. We will get a cover this with Hydrofera Blue to give that shot and see how this will do. 3. I am also can recommend the patient continue to elevate his legs much as possible and wear his compression stocking. We will see patient back for reevaluation in 1 week here in the clinic. If anything worsens or changes patient will contact our office for additional recommendations. Electronic Signature(s) Signed: 06/26/2020 10:31:21 AM By: Worthy Keeler PA-C Previous Signature: 06/26/2020 10:14:47 AM Version By: Worthy Keeler PA-C Entered By: Worthy Keeler on 06/26/2020 10:31:20 [name] ([id number]) -------------------------------------------------------------------------------- SuperBill Details Patient Name: [name] Date of Service: 06/26/2020 Medical Record Number: [id number] Patient Account Number: [id number] Date of Birth/Sex: [birth year] (40 y.o. M) Treating RN: Carlene Coria Primary Care Provider: [name] Other Clinician: Referring Provider: [name] Treating Provider/Extender: [name] Cliche in Treatment: 185 Diagnosis Coding ICD-10 Codes Code Description [phone] Non-pressure chronic ulcer of left calf with fat layer exposed L88 Pyoderma gangrenosum L97.321 Non-pressure chronic ulcer of left ankle limited to breakdown of skin I87.2 Venous insufficiency (chronic) (peripheral) L03.116 Cellulitis of left lower limb F17.208 Nicotine dependence, unspecified, with other nicotine-induced disorders Facility Procedures CPT4 Code: [id number] Description: [phone]  WOUND CARE VISIT-LEV 3 EST PT Modifier: Quantity: 1 Physician Procedures CPT4 Code: [id number] Description: 03888 - WC PHYS LEVEL 4 - EST PT Modifier: Quantity: 1 CPT4 Code: Description: ICD-10 Diagnosis Description L97.222 Non-pressure chronic ulcer of left calf with fat layer exposed L88 Pyoderma gangrenosum L97.321 Non-pressure chronic ulcer of left ankle limited to breakdown of skin I87.2 Venous insufficiency (chronic)  (peripheral) Modifier: Quantity: Electronic Signature(s) Signed: 06/26/2020 10:31:36 AM By: Worthy Keeler PA-C Entered By: Worthy Keeler on 06/26/2020 10:31:35

## 2020-06-27 NOTE — Telephone Encounter (Signed)
Called patient and scheduled for OV 2/9. Sending to PCP as FYI.

## 2020-06-28 ENCOUNTER — Other Ambulatory Visit: Payer: Self-pay

## 2020-06-28 ENCOUNTER — Encounter: Payer: Self-pay | Admitting: Primary Care

## 2020-06-28 ENCOUNTER — Ambulatory Visit (INDEPENDENT_AMBULATORY_CARE_PROVIDER_SITE_OTHER): Payer: 59 | Admitting: Primary Care

## 2020-06-28 VITALS — BP 144/78 | HR 88 | Temp 97.7°F | Ht 71.0 in | Wt 353.0 lb

## 2020-06-28 DIAGNOSIS — L88 Pyoderma gangrenosum: Secondary | ICD-10-CM | POA: Diagnosis not present

## 2020-06-28 DIAGNOSIS — E785 Hyperlipidemia, unspecified: Secondary | ICD-10-CM | POA: Diagnosis not present

## 2020-06-28 DIAGNOSIS — Z720 Tobacco use: Secondary | ICD-10-CM

## 2020-06-28 DIAGNOSIS — E119 Type 2 diabetes mellitus without complications: Secondary | ICD-10-CM | POA: Diagnosis not present

## 2020-06-28 DIAGNOSIS — F3342 Major depressive disorder, recurrent, in full remission: Secondary | ICD-10-CM

## 2020-06-28 DIAGNOSIS — I10 Essential (primary) hypertension: Secondary | ICD-10-CM

## 2020-06-28 LAB — LIPID PANEL
Cholesterol: 116 mg/dL (ref 0–200)
HDL: 41.5 mg/dL (ref >39.00)
LDL Cholesterol: 63 mg/dL (ref 0–99)
NonHDL: 74.94
Total CHOL/HDL Ratio: 3
Triglycerides: 61 mg/dL (ref 0.0–149.0)
VLDL: 12.2 mg/dL (ref 0.0–40.0)

## 2020-06-28 LAB — COMPREHENSIVE METABOLIC PANEL
ALT: 77 U/L — ABNORMAL HIGH (ref 0–53)
AST: 38 U/L — ABNORMAL HIGH (ref 0–37)
Albumin: 4.2 g/dL (ref 3.5–5.2)
Alkaline Phosphatase: 121 U/L — ABNORMAL HIGH (ref 39–117)
BUN: 12 mg/dL (ref 6–23)
CO2: 28 mEq/L (ref 19–32)
Calcium: 9.1 mg/dL (ref 8.4–10.5)
Chloride: 99 mEq/L (ref 96–112)
Creatinine, Ser: 0.96 mg/dL (ref 0.40–1.50)
GFR: 98.23 mL/min (ref >60.00)
Glucose, Bld: 215 mg/dL — ABNORMAL HIGH (ref 70–99)
Potassium: 4.4 mEq/L (ref 3.5–5.1)
Sodium: 135 mEq/L (ref 135–145)
Total Bilirubin: 0.6 mg/dL (ref 0.2–1.2)
Total Protein: 7 g/dL (ref 6.0–8.3)

## 2020-06-28 LAB — POCT GLYCOSYLATED HEMOGLOBIN (HGB A1C): Hemoglobin A1C: 10.3 % — AB (ref 4.0–5.6)

## 2020-06-28 LAB — CBC
HCT: 45.2 % (ref 39.0–52.0)
Hemoglobin: 15.3 g/dL (ref 13.0–17.0)
MCHC: 33.7 g/dL (ref 30.0–36.0)
MCV: 89.4 fl (ref 78.0–100.0)
Platelets: 209 10*3/uL (ref 150.0–400.0)
RBC: 5.06 Mil/uL (ref 4.22–5.81)
RDW: 12.8 % (ref 11.5–15.5)
WBC: 9 10*3/uL (ref 4.0–10.5)

## 2020-06-28 LAB — MICROALBUMIN / CREATININE URINE RATIO
Creatinine,U: 195.9 mg/dL
Microalb Creat Ratio: 2.7 mg/g (ref 0.0–30.0)
Microalb, Ur: 5.4 mg/dL — ABNORMAL HIGH (ref 0.0–1.9)

## 2020-06-28 MED ORDER — LANTUS SOLOSTAR 100 UNIT/ML ~~LOC~~ SOPN: 30.0000 [IU] | PEN_INJECTOR | Freq: Every day | SUBCUTANEOUS | 2 refills | Status: DC

## 2020-06-28 MED ORDER — TRULICITY 0.75 MG/0.5ML ~~LOC~~ SOAJ: 0.7500 mg | SUBCUTANEOUS | 3 refills | Status: DC

## 2020-06-28 MED ORDER — VARENICLINE TARTRATE 1 MG PO TABS: ORAL_TABLET | ORAL | 0 refills | Status: DC

## 2020-06-28 MED ORDER — TRULICITY 0.75 MG/0.5 ML SUBCUTANEOUS PEN INJECTOR
SUBCUTANEOUS | 0.00000 days
Start: 2020-06-28 — End: ?

## 2020-06-28 NOTE — Unmapped (Signed)
[facility]

## 2020-06-28 NOTE — Assessment & Plan Note (Signed)
Smoking 1 and 1/2 PPD. Ready to quit.  Will try prescribing varenicline tablets. Discussed correct dosing for stating. He is aware of instructions on how to take and when to quit smoking.   Rx for varelinicline 1 mg sent to pharmacy.

## 2020-06-28 NOTE — Assessment & Plan Note (Signed)
Several documented readings above goal, also seeing elevated readings at dermatology.   Discussed potential effects of uncontrolled hypertension, especially with tobacco abuse and uncontrolled diabetes.  He declines treatment, he would like to work on his diet and weight loss. We will see him back in 3 months for follow up. He will monitor bP at home.

## 2020-06-28 NOTE — Patient Instructions (Addendum)
Start monitoring your blood pressure daily, around the same time of day, after resting for 30 minutes. Monitor your readings and notify me if you see readings consistently at or above 130/90.  Start checking your blood sugar levels.  Appropriate times to check your blood sugar levels are:  -Before any meal (breakfast, lunch, dinner) -Two hours after any meal (breakfast, lunch, dinner) -Bedtime  Record your readings and notify me if you continue to consistently run at or above 150 after 2 weeks.  We increased your Lantus to 30 units. Start dulauglutide (Trulicity) 0.75 mg once weekly for diabetes.  Start varenicline 1 mg for tobacco cessation. Take 1/2 tablet once daily for three days, then 1/2 tablet twice daily for four days, then 1 tablet twice daily thereafter.  It is important that you improve your diet. Please limit carbohydrates in the form of white bread, rice, pasta, sweets, fast food, fried food, sugary drinks, etc. Increase your consumption of fresh fruits and vegetables, whole grains, lean protein.  Ensure you are consuming 64 ounces of water daily.  Please schedule a follow up appointment in 3 months.  It was a pleasure to see you today!   Diabetes Mellitus and Nutrition, Adult When you have diabetes, or diabetes mellitus, it is very important to have healthy eating habits because your blood sugar (glucose) levels are greatly affected by what you eat and drink. Eating healthy foods in the right amounts, at about the same times every day, can help you:  Control your blood glucose.  Lower your risk of heart disease.  Improve your blood pressure.  Reach or maintain a healthy weight. What can affect my meal plan? Every person with diabetes is different, and each person has different needs for a meal plan. Your health care provider may recommend that you work with a dietitian to make a meal plan that is best for you. Your meal plan may vary depending on factors such  as:  The calories you need.  The medicines you take.  Your weight.  Your blood glucose, blood pressure, and cholesterol levels.  Your activity level.  Other health conditions you have, such as heart or kidney disease. How do carbohydrates affect me? Carbohydrates, also called carbs, affect your blood glucose level more than any other type of food. Eating carbs naturally raises the amount of glucose in your blood. Carb counting is a method for keeping track of how many carbs you eat. Counting carbs is important to keep your blood glucose at a healthy level, especially if you use insulin or take certain oral diabetes medicines. It is important to know how many carbs you can safely have in each meal. This is different for every person. Your dietitian can help you calculate how many carbs you should have at each meal and for each snack. How does alcohol affect me? Alcohol can cause a sudden decrease in blood glucose (hypoglycemia), especially if you use insulin or take certain oral diabetes medicines. Hypoglycemia can be a life-threatening condition. Symptoms of hypoglycemia, such as sleepiness, dizziness, and confusion, are similar to symptoms of having too much alcohol.  Do not drink alcohol if: ? Your health care provider tells you not to drink. ? You are pregnant, may be pregnant, or are planning to become pregnant.  If you drink alcohol: ? Do not drink on an empty stomach. ? Limit how much you use to:  0-1 drink a day for women.  0-2 drinks a day for men. ? Be aware of  how much alcohol is in your drink. In the [place] one drink equals one 12 oz bottle of beer (355 mL), one 5 oz glass of wine (148 mL), or one 1 oz glass of hard liquor (44 mL). ? Keep yourself hydrated with water, diet soda, or unsweetened iced tea.  Keep in mind that regular soda, juice, and other mixers may contain a lot of sugar and must be counted as carbs. What are tips for following this plan? Reading food  labels  Start by checking the serving size on the "Nutrition Facts" label of packaged foods and drinks. The amount of calories, carbs, fats, and other nutrients listed on the label is based on one serving of the item. Many items contain more than one serving per package.  Check the total grams (g) of carbs in one serving. You can calculate the number of servings of carbs in one serving by dividing the total carbs by 15. For example, if a food has 30 g of total carbs per serving, it would be equal to 2 servings of carbs.  Check the number of grams (g) of saturated fats and trans fats in one serving. Choose foods that have a low amount or none of these fats.  Check the number of milligrams (mg) of salt (sodium) in one serving. Most people should limit total sodium intake to less than 2,300 mg per day.  Always check the nutrition information of foods labeled as "low-fat" or "nonfat." These foods may be higher in added sugar or refined carbs and should be avoided.  Talk to your dietitian to identify your daily goals for nutrients listed on the label. Shopping  Avoid buying canned, pre-made, or processed foods. These foods tend to be high in fat, sodium, and added sugar.  Shop around the outside edge of the grocery store. This is where you will most often find fresh fruits and vegetables, bulk grains, fresh meats, and fresh dairy. Cooking  Use low-heat cooking methods, such as baking, instead of high-heat cooking methods like deep frying.  Cook using healthy oils, such as olive, canola, or sunflower oil.  Avoid cooking with butter, cream, or high-fat meats. Meal planning  Eat meals and snacks regularly, preferably at the same times every day. Avoid going long periods of time without eating.  Eat foods that are high in fiber, such as fresh fruits, vegetables, beans, and whole grains. Talk with your dietitian about how many servings of carbs you can eat at each meal.  Eat 4-6 oz (112-168 g)  of lean protein each day, such as lean meat, chicken, fish, eggs, or tofu. One ounce (oz) of lean protein is equal to: ? 1 oz (28 g) of meat, chicken, or fish. ? 1 egg. ?  cup (62 g) of tofu.  Eat some foods each day that contain healthy fats, such as avocado, nuts, seeds, and fish.   What foods should I eat? Fruits Berries. Apples. Oranges. Peaches. Apricots. Plums. Grapes. Mango. Papaya. Pomegranate. Kiwi. Cherries. Vegetables Lettuce. Spinach. Leafy greens, including kale, chard, collard greens, and mustard greens. Beets. Cauliflower. Cabbage. Broccoli. Carrots. Green beans. Tomatoes. Peppers. Onions. Cucumbers. Brussels sprouts. Grains Whole grains, such as whole-wheat or whole-grain bread, crackers, tortillas, cereal, and pasta. Unsweetened oatmeal. Quinoa. Brown or wild rice. Meats and other proteins Seafood. Poultry without skin. Lean cuts of poultry and beef. Tofu. Nuts. Seeds. Dairy Low-fat or fat-free dairy products such as milk, yogurt, and cheese. The items listed above may not be a complete list  of foods and beverages you can eat. Contact a dietitian for more information. What foods should I avoid? Fruits Fruits canned with syrup. Vegetables Canned vegetables. Frozen vegetables with butter or cream sauce. Grains Refined white flour and flour products such as bread, pasta, snack foods, and cereals. Avoid all processed foods. Meats and other proteins Fatty cuts of meat. Poultry with skin. Breaded or fried meats. Processed meat. Avoid saturated fats. Dairy Full-fat yogurt, cheese, or milk. Beverages Sweetened drinks, such as soda or iced tea. The items listed above may not be a complete list of foods and beverages you should avoid. Contact a dietitian for more information. Questions to ask a health care provider  Do I need to meet with a diabetes educator?  Do I need to meet with a dietitian?  What number can I call if I have questions?  When are the best times to  check my blood glucose? Where to find more information:  American Diabetes Association: diabetes.org  Academy of Nutrition and Dietetics: www.eatright.[employer] of Diabetes and Digestive and Kidney Diseases: CarFlippers.tn  Association of Diabetes Care and Education Specialists: www.diabeteseducator.org Summary  It is important to have healthy eating habits because your blood sugar (glucose) levels are greatly affected by what you eat and drink.  A healthy meal plan will help you control your blood glucose and maintain a healthy lifestyle.  Your health care provider may recommend that you work with a dietitian to make a meal plan that is best for you.  Keep in mind that carbohydrates (carbs) and alcohol have immediate effects on your blood glucose levels. It is important to count carbs and to use alcohol carefully. This information is not intended to replace advice given to you by your health care provider. Make sure you discuss any questions you have with your health care provider. Document Revised: 04/13/2019 Document Reviewed: 04/13/2019 Elsevier Patient Education  2021 [employer].

## 2020-06-28 NOTE — Assessment & Plan Note (Signed)
Following with [facility] dermatology, plan is to undergo skin grafting once he quits smoking.  Also discussed how his uncontrolled diabetes is playing a role in his slowly healing wound.

## 2020-06-28 NOTE — Progress Notes (Signed)
Subjective:    Patient ID: [name], male    DOB: 11/09/1980, 40 y.o.   MRN: [id number]  HPI  This visit occurred during the SARS-CoV-2 public health emergency.  Safety protocols were in place, including screening questions prior to the visit, additional usage of staff PPE, and extensive cleaning of exam room while observing appropriate contact time as indicated for disinfecting solutions.   [name] is a 40 year old male with a history of ulcerative colitis, type 2 diabetes, pyoderma gangrenosum, hyperlipidemia, tobacco abuse, depression who presents today for follow up of chronic conditions.  1) Type 2 Diabetes:  Current medications include: Metformin 1000 mg BID, Lantus 25 units daily  He is checking his blood glucose 0 times daily. He only checks his glucose readings when he "feels bad" and is getting readings of 200-250's.  Last A1C: 8.1 in April 2021, 10.3 today Last Eye Exam: Due  Last Foot Exam: UTD Pneumonia Vaccination: Due, declines  ACE/ARB: None. Urine microalbumin pending Statin: Lipitor   He endorses a poor diet is eating a lot of sweets, fast food, junk food. He's also noticed increased thirst and dry mouth.   2) Depression: Currently managed on venlafaxine XR 187.5 mg. He is now taking only the XR 150 mg dose, feels well managed.   3) Pyoderma gangrenosum: Following with dermatology through [facility], managed on Remicaie injections every 6 weeks. The current plan is to complete skin grafts but he cannot have this done until he quits smoking. Overall his wound is stable, is slowly improving.   4) Tobacco Abuse: Chronic intermittently for 17 years. He is now smoking 1 to 1 and 1/2 PPD of cigarettes. He is ready to quit smoking. He was prescribed Chantix during last visit, he picked up the Rx and was told by his pharmacy that the drug had been recalled. He was told that the "generic" is available.   5) Essential Hypertension: No current treatment. BP yesterday for  Remicade infusion was initially "very high". He's noticed fluctuations in BP recently which has alarmed him. He denies a prior history. He has been eating a poor diet and is not exercising. He is smoking 1 to 1 and 1/2 PPD of cigarettes.    BP Readings from Last 3 Encounters:  06/28/20 (!) 144/78  10/11/19 (!) 156/96  09/15/19 133/82   Wt Readings from Last 3 Encounters:  06/28/20 (!) 353 lb (160.1 kg)  10/11/19 (!) 350 lb (158.8 kg)  09/15/19 (!) 354 lb (160.6 kg)     Review of Systems  Eyes: Negative for visual disturbance.  Respiratory: Negative for shortness of breath.   Cardiovascular: Negative for chest pain.  Skin: Positive for wound.  Neurological: Negative for headaches.       Past Medical History:  Diagnosis Date  . Blood in stool   . Depression   . Elevated blood pressure   . Hyperlipidemia   . OSA (obstructive sleep apnea) 2014  . Seasonal allergies   . Ulcerative colitis ([place]) 03/05/2018     Social History   Socioeconomic History  . Marital status: Married    Spouse name: Not on file  . Number of children: Not on file  . Years of education: Not on file  . Highest education level: Not on file  Occupational History  . Not on file  Tobacco Use  . Smoking status: Former Smoker    Packs/day: 2.00    Years: 14.00    Pack years: 28.00  Types: Cigarettes    Quit date: 11/25/2018    Years since quitting: 1.5  . Smokeless tobacco: Former Systems developer    Quit date: 03/17/2015  Vaping Use  . Vaping Use: Never used  Substance and Sexual Activity  . Alcohol use: Yes    Alcohol/week: 0.0 standard drinks    Comment: soical  . Drug use: Not on file  . Sexual activity: Not on file  Other Topics Concern  . Not on file  Social History Narrative   Married.   7 kids.   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   Enjoys target shooting.    Social Determinants of Health   Financial Resource Strain: Not on file  Food Insecurity: Not on file  Transportation Needs: Not  on file  Physical Activity: Not on file  Stress: Not on file  Social Connections: Not on file  Intimate Partner Violence: Not on file    Past Surgical History:  Procedure Laterality Date  . SEPTOPLASTY  2007  . WRIST SURGERY      Family History  Problem Relation Age of Onset  . Alcohol abuse Paternal Aunt   . Alcohol abuse Paternal Uncle   . Stroke Paternal Uncle   . Hyperlipidemia Maternal Grandfather   . Hypertension Maternal Grandfather   . Diabetes Maternal Grandfather   . Alcohol abuse Maternal Grandfather   . Multiple sclerosis Mother   . Dementia Mother     Allergies  Allergen Reactions  . Biaxin [Clarithromycin] Other (See Comments)    Causes colitis flares  . Milk-Related Compounds Hives    Current Outpatient Medications on File Prior to Visit  Medication Sig Dispense Refill  . venlafaxine XR (EFFEXOR-XR) 150 MG 24 hr capsule Take 150 mg by mouth daily with breakfast.    . acetaminophen (TYLENOL) 500 MG tablet Take 1,000 mg by mouth every 6 (six) hours as needed for headache (pain).    Marland Kitchen atorvastatin (LIPITOR) 40 MG tablet TAKE 1 TABLET BY MOUTH IN THE EVENING FOR CHOLESTEROL 30 tablet 0  . blood glucose meter kit and supplies KIT Dispense based on patient and insurance preference. Use up to four times daily as directed. (FOR ICD-9 250.00, 250.01). 1 each 0  . clobetasol ointment (TEMOVATE) 5.32 % Apply 1 application topically 2 (two) times daily.     . fluticasone (FLONASE) 50 MCG/ACT nasal spray Place 1 spray into both nostrils 2 (two) times daily. 16 g 6  . ibuprofen (ADVIL,MOTRIN) 200 MG tablet Take 400-600 mg by mouth every 6 (six) hours as needed for headache (pain).    . inFLIXimab (REMICADE) 100 MG injection Inject into the muscle once a week.    . metFORMIN (GLUCOPHAGE) 1000 MG tablet TAKE 1 TABLET BY MOUTH TWICE DAILY WITH MEALS FOR  DIABETES 180 tablet 0  . mupirocin ointment (BACTROBAN) 2 % Apply topically two (2) 60g times a day. Mix with clobetasol  and apply twice daily    . RELION PEN NEEDLES 31G X 6 MM MISC USE NIGHTLY WITH INSULIN 100 each 0   No current facility-administered medications on file prior to visit.    BP (!) 144/78   Pulse 88   Temp 97.7 F (36.5 C) (Temporal)   Ht _0  (1.803 m)   Wt (!) 353 lb (160.1 kg)   SpO2 98%   BMI 49.23 kg/m    Objective:   Physical Exam Constitutional:      Appearance: He is well-nourished.  Cardiovascular:  Rate and Rhythm: Normal rate and regular rhythm.  Pulmonary:     Effort: Pulmonary effort is normal.     Breath sounds: Normal breath sounds.  Musculoskeletal:     Cervical back: Neck supple.  Skin:    General: Skin is warm and dry.  Psychiatric:        Mood and Affect: Mood and affect normal.            Assessment & Plan:

## 2020-06-28 NOTE — Assessment & Plan Note (Signed)
Doing well on venlafaxine XR 150 mg daily, continue same.

## 2020-06-28 NOTE — Progress Notes (Signed)
[name], [name] ([id number]) Visit Report for 06/26/2020 Arrival Information Details Patient Name: [name], [name] Date of Service: 06/26/2020 9:00 AM Medical Record Number: [id number] Patient Account Number: [id number] Date of Birth/Sex: 1981/01/15 (40 y.o. M) Treating RN: Dolan Amen Primary Care Brailynn Breth: [name] Other Clinician: Referring Erza Mothershead: [name] Treating Sumiya Mamaril/Extender: [name] Cliche in Treatment: 185 Visit Information History Since Last Visit Pain Present Now: Yes Patient Arrived: Ambulatory Arrival Time: 09:16 Accompanied By: self Transfer Assistance: None Patient Identification Verified: Yes Secondary Verification Process Completed: Yes Patient Requires Transmission-Based Precautions: No Patient Has Alerts: Yes Electronic Signature(s) Signed: 06/26/2020 10:53:21 AM By: Georges Mouse, Minus Breeding RN Entered By: Georges Mouse, Minus Breeding on 06/26/2020 09:16:18 [name] ([id number]) -------------------------------------------------------------------------------- Clinic Level of Care Assessment Details Patient Name: [name] Date of Service: 06/26/2020 9:00 AM Medical Record Number: [id number] Patient Account Number: [id number] Date of Birth/Sex: Sep 21, 1980 (40 y.o. M) Treating RN: Carlene Coria Primary Care Keshanna Riso: [name] Other Clinician: Referring Aaliyah Gavel: [name] Treating Deloras Reichard/Extender: [name] Cliche in Treatment: 185 Clinic Level of Care Assessment Items TOOL 4 Quantity Score X - Use when only an EandM is performed on FOLLOW-UP visit 1 0 ASSESSMENTS - Nursing Assessment / Reassessment X - Reassessment of Co-morbidities (includes updates in patient status) 1 10 X- 1 5 Reassessment of Adherence to Treatment Plan ASSESSMENTS - Wound and Skin Assessment / Reassessment X - Simple Wound Assessment / Reassessment - one wound 1 5 []  - 0 Complex Wound Assessment / Reassessment - multiple wounds []  - 0 Dermatologic /  Skin Assessment (not related to wound area) ASSESSMENTS - Focused Assessment []  - Circumferential Edema Measurements - multi extremities 0 []  - 0 Nutritional Assessment / Counseling / Intervention []  - 0 Lower Extremity Assessment (monofilament, tuning fork, pulses) []  - 0 Peripheral Arterial Disease Assessment (using hand held doppler) ASSESSMENTS - Ostomy and/or Continence Assessment and Care []  - Incontinence Assessment and Management 0 []  - 0 Ostomy Care Assessment and Management (repouching, etc.) PROCESS - Coordination of Care X - Simple Patient / Family Education for ongoing care 1 15 []  - 0 Complex (extensive) Patient / Family Education for ongoing care X- 1 10 Staff obtains Programmer, systems, Records, Test Results / Process Orders []  - 0 Staff telephones HHA, Nursing Homes / Clarify orders / etc []  - 0 Routine Transfer to another Facility (non-emergent condition) []  - 0 Routine Hospital Admission (non-emergent condition) []  - 0 New Admissions / Biomedical engineer / Ordering NPWT, Apligraf, etc. []  - 0 Emergency Hospital Admission (emergent condition) X- 1 10 Simple Discharge Coordination []  - 0 Complex (extensive) Discharge Coordination PROCESS - Special Needs []  - Pediatric / Minor Patient Management 0 []  - 0 Isolation Patient Management []  - 0 Hearing / Language / Visual special needs []  - 0 Assessment of Community assistance (transportation, D/C planning, etc.) []  - 0 Additional assistance / Altered mentation []  - 0 Support Surface(s) Assessment (bed, cushion, seat, etc.) INTERVENTIONS - Wound Cleansing / Measurement [name], [name] ([id number]) X- 1 5 Simple Wound Cleansing - one wound []  - 0 Complex Wound Cleansing - multiple wounds X- 1 5 Wound Imaging (photographs - any number of wounds) []  - 0 Wound Tracing (instead of photographs) X- 1 5 Simple Wound Measurement - one wound []  - 0 Complex Wound Measurement - multiple wounds INTERVENTIONS - Wound  Dressings []  - Small Wound Dressing one or multiple wounds 0 X- 1 15 Medium Wound Dressing one or multiple wounds []  - 0 Large Wound Dressing one or multiple wounds  X- 1 5 Application of Medications - topical []  - 0 Application of Medications - injection INTERVENTIONS - Miscellaneous []  - External ear exam 0 []  - 0 Specimen Collection (cultures, biopsies, blood, body fluids, etc.) []  - 0 Specimen(s) / Culture(s) sent or taken to Lab for analysis []  - 0 Patient Transfer (multiple staff / Civil Service fast streamer / Similar devices) []  - 0 Simple Staple / Suture removal (25 or less) []  - 0 Complex Staple / Suture removal (26 or more) []  - 0 Hypo / Hyperglycemic Management (close monitor of Blood Glucose) []  - 0 Ankle / Brachial Index (ABI) - do not check if billed separately X- 1 5 Vital Signs Has the patient been seen at the hospital within the last three years: Yes Total Score: 95 Level Of Care: New/Established - Level 3 Electronic Signature(s) Signed: 06/28/2020 9:44:48 AM By: Carlene Coria RN Entered By: Carlene Coria on 06/26/2020 [name], [name] ([id number]) -------------------------------------------------------------------------------- Encounter Discharge Information Details Patient Name: [name] Date of Service: 06/26/2020 9:00 AM Medical Record Number: [id number] Patient Account Number: [id number] Date of Birth/Sex: 28-May-1980 (40 y.o. M) Treating RN: Carlene Coria Primary Care Elward Nocera: [name] Other Clinician: Referring Ardice Boyan: [name] Treating Natina Wiginton/Extender: [name] Cliche in Treatment: 185 Encounter Discharge Information Items Discharge Condition: Stable Ambulatory Status: Ambulatory Discharge Destination: Home Transportation: Private Auto Accompanied By: self Schedule Follow-up Appointment: Yes Clinical Summary of Care: Patient Declined Electronic Signature(s) Signed: 06/28/2020 9:44:48 AM By: Carlene Coria RN Entered By: Carlene Coria  on 06/26/2020 09:50:54 [name] ([id number]) -------------------------------------------------------------------------------- Lower Extremity Assessment Details Patient Name: [name] Date of Service: 06/26/2020 9:00 AM Medical Record Number: [id number] Patient Account Number: [id number] Date of Birth/Sex: 04/30/1981 (39 y.o. M) Treating RN: Dolan Amen Primary Care Trenda Corliss: [name] Other Clinician: Referring Allure Greaser: [name] Treating Monnie Gudgel/Extender: Jeri Cos Weeks in Treatment: 185 Edema Assessment Assessed: [Left: Yes] [Right: No] Edema: [Left: Ye] [Right: s] Calf Left: Right: Point of Measurement: 33 cm From Medial Instep 48 cm Ankle Left: Right: Point of Measurement: 12 cm From Medial Instep 31 cm Electronic Signature(s) Signed: 06/26/2020 10:53:21 AM By: Georges Mouse, Minus Breeding RN Entered By: Georges Mouse, Minus Breeding on 06/26/2020 09:25:33 [name] ([id number]) -------------------------------------------------------------------------------- Multi Wound Chart Details Patient Name: [name] Date of Service: 06/26/2020 9:00 AM Medical Record Number: [id number] Patient Account Number: [id number] Date of Birth/Sex: August 21, 1980 (39 y.o. M) Treating RN: Carlene Coria Primary Care Autymn Omlor: [name] Other Clinician: Referring Tamra Koos: [name] Treating Alexea Blase/Extender: [name] Cliche in Treatment: 185 Vital Signs Height(in): 71 Pulse(bpm): 82 Weight(lbs): 338 Blood Pressure(mmHg): 129/86 Body Mass Index(BMI): 47 Temperature(F): 98.2 Respiratory Rate(breaths/min): 18 Photos: [N/A:N/A] Wound Location: Left, Lateral Lower Leg N/A N/A Wounding Event: Gradually Appeared N/A N/A Primary Etiology: Pyoderma N/A N/A Comorbid History: Sleep Apnea, Hypertension, Colitis N/A N/A Date Acquired: 11/18/2015 N/A N/A Weeks of Treatment: 185 N/A N/A Wound Status: Open N/A N/A Measurements L x W x D (cm) 5.6x6.3x0.7 N/A N/A Area  (cm) : 27.709 N/A N/A Volume (cm) : 19.396 N/A N/A % Reduction in Area: -464.50% N/A N/A % Reduction in Volume: -393.90% N/A N/A Classification: Full Thickness With Exposed N/A N/A Support Structures Exudate Amount: Large N/A N/A Exudate Type: Serosanguineous N/A N/A Exudate Color: red, brown N/A N/A Wound Margin: Epibole N/A N/A Granulation Amount: Medium (34-66%) N/A N/A Granulation Quality: Red N/A N/A Necrotic Amount: Medium (34-66%) N/A N/A Exposed Structures: Fat Layer (Subcutaneous Tissue): N/A N/A Yes Muscle: Yes Fascia: No Tendon: No Joint: No Bone: No Epithelialization: None N/A  N/A Treatment Notes Electronic Signature(s) Signed: 06/28/2020 9:44:48 AM By: Carlene Coria RN Entered By: Carlene Coria on 06/26/2020 [name], [name] ([id number]) -------------------------------------------------------------------------------- Multi-Disciplinary Care Plan Details Patient Name: [name] Date of Service: 06/26/2020 9:00 AM Medical Record Number: [id number] Patient Account Number: [id number] Date of Birth/Sex: 12/19/1980 (39 y.o. M) Treating RN: Carlene Coria Primary Care Maymuna Detzel: [name] Other Clinician: Referring Emmerich Cryer: [name] Treating Bern Fare/Extender: [name] Cliche in Treatment: 185 Active Inactive Wound/Skin Impairment Nursing Diagnoses: Impaired tissue integrity Knowledge deficit related to ulceration/compromised skin integrity Goals: Patient/caregiver will verbalize understanding of skin care regimen Date Initiated: 12/11/2016 Target Resolution Date: 07/24/2017 Goal Status: Active Ulcer/skin breakdown will have a volume reduction of 30% by week 4 Date Initiated: 12/11/2016 Date Inactivated: 06/26/2020 Target Resolution Date: 03/13/2017 Goal Status: Unmet Unmet Reason: comorbities Ulcer/skin breakdown will have a volume reduction of 50% by week 8 Date Initiated: 12/11/2016 Date Inactivated: 06/26/2020 Target Resolution Date:  03/13/2017 Goal Status: Unmet Unmet Reason: comorbities Ulcer/skin breakdown will have a volume reduction of 80% by week 12 Date Initiated: 12/11/2016 Date Inactivated: 06/26/2020 Target Resolution Date: 03/13/2017 Goal Status: Unmet Unmet Reason: comorbities Ulcer/skin breakdown will heal within 14 weeks Date Initiated: 12/11/2016 Date Inactivated: 06/26/2020 Target Resolution Date: 03/13/2017 Goal Status: Unmet Unmet Reason: comorbities Interventions: Assess patient/caregiver ability to obtain necessary supplies Assess patient/caregiver ability to perform ulcer/skin care regimen upon admission and as needed Assess ulceration(s) every visit Provide education on ulcer and skin care Treatment Activities: Skin care regimen initiated : 12/11/2016 Topical wound management initiated : 12/11/2016 Notes: Electronic Signature(s) Signed: 06/28/2020 9:44:48 AM By: Carlene Coria RN Entered By: Carlene Coria on 06/26/2020 09:39:01 [name] ([id number]) -------------------------------------------------------------------------------- Pain Assessment Details Patient Name: [name] Date of Service: 06/26/2020 9:00 AM Medical Record Number: [id number] Patient Account Number: [id number] Date of Birth/Sex: 02/08/1981 (40 y.o. M) Treating RN: Dolan Amen Primary Care Nathaneal Sommers: [name] Other Clinician: Referring Rateel Beldin: [name] Treating Brodee Mauritz/Extender: [name] Cliche in Treatment: 185 Active Problems Location of Pain Severity and Description of Pain Patient Has Paino Yes Site Locations Pain Location: Pain in Ulcers Rate the pain. Current Pain Level: 4 Pain Management and Medication Current Pain Management: Electronic Signature(s) Signed: 06/26/2020 10:53:21 AM By: Georges Mouse, Minus Breeding RN Entered By: Georges Mouse, Minus Breeding on 06/26/2020 09:18:25 [name]  ([id number]) -------------------------------------------------------------------------------- Patient/Caregiver Education Details Patient Name: [name] Date of Service: 06/26/2020 9:00 AM Medical Record Number: [id number] Patient Account Number: [id number] Date of Birth/Gender: 1980/05/30 (39 y.o. M) Treating RN: Carlene Coria Primary Care Physician: [name] Other Clinician: Referring Physician: [name] Treating Physician/Extender: [name] Cliche in Treatment: 17 Education Assessment Education Provided To: Patient Education Topics Provided Wound/Skin Impairment: Methods: Explain/Verbal Responses: State content correctly Electronic Signature(s) Signed: 06/28/2020 9:44:48 AM By: Carlene Coria RN Entered By: Carlene Coria on 06/26/2020 09:46:51 Pen Mar, [name] ([id number]) -------------------------------------------------------------------------------- Wound Assessment Details Patient Name: [name] Date of Service: 06/26/2020 9:00 AM Medical Record Number: [id number] Patient Account Number: [id number] Date of Birth/Sex: 16-Nov-1980 (39 y.o. M) Treating RN: Dolan Amen Primary Care Tifanny Dollens: [name] Other Clinician: Referring Dayln [name]: [name] Treating Kevina Piloto/Extender: [name] Cliche in Treatment: 185 Wound Status Wound Number: 1 Primary Etiology: Pyoderma Wound Location: Left, Lateral Lower Leg Wound Status: Open Wounding Event: Gradually Appeared Comorbid History: Sleep Apnea, Hypertension, Colitis Date Acquired: 11/18/2015 Weeks Of Treatment: 185 Clustered Wound: No Photos Wound Measurements Length: (cm) 5.6 Width: (cm) 6.3 Depth: (cm) 0.7 Area: (cm) 27.709 Volume: (cm) 19.396 % Reduction in Area: -464.5% % Reduction in  Volume: -393.9% Epithelialization: None Tunneling: No Undermining: No Wound Description Classification: Full Thickness With Exposed Support Structures Wound Margin: Epibole Exudate Amount: Large Exudate  Type: Serosanguineous Exudate Color: red, brown Foul Odor After Cleansing: No Slough/Fibrino Yes Wound Bed Granulation Amount: Medium (34-66%) Exposed Structure Granulation Quality: Red Fascia Exposed: No Necrotic Amount: Medium (34-66%) Fat Layer (Subcutaneous Tissue) Exposed: Yes Necrotic Quality: Adherent Slough Tendon Exposed: No Muscle Exposed: Yes Necrosis of Muscle: No Joint Exposed: No Bone Exposed: No Treatment Notes Wound #1 (Lower Leg) Wound Laterality: Left, Lateral Cleanser Soap and Water Discharge Instruction: Gently cleanse wound with antibacterial soap, rinse and pat dry prior to dressing wounds [name], [name] ([id number]) Peri-Wound Care Topical Primary Dressing Hydrofera Blue Ready Transfer Foam, 2.5x2.5 (in/in) Discharge Instruction: Apply Hydrofera Blue Ready to wound bed as directed gentamicin ointment Discharge Instruction: apply to wound bed Secondary Dressing Bordered Gauze Sterile-HBD 4x4 (in/in) Discharge Instruction: Cover wound with Bordered Guaze Sterile as directed Secured With Compression Wrap Compression Stockings Add-Ons Electronic Signature(s) Signed: 06/26/2020 10:53:21 AM By: Georges Mouse, Minus Breeding RN Entered By: Georges Mouse, Minus Breeding on 06/26/2020 09:23:25 [name] ([id number]) -------------------------------------------------------------------------------- Vitals Details Patient Name: [name] Date of Service: 06/26/2020 9:00 AM Medical Record Number: [id number] Patient Account Number: [id number] Date of Birth/Sex: 24-Sep-1980 (40 y.o. M) Treating RN: Dolan Amen Primary Care Vendetta Pittinger: [name] Other Clinician: Referring Jackelyn Illingworth: [name] Treating Adamariz Gillott/Extender: [name] Cliche in Treatment: 185 Vital Signs Time Taken: 09:16 Temperature (F): 98.2 Height (in): 71 Pulse (bpm): 82 Weight (lbs): 338 Respiratory Rate (breaths/min): 18 Body Mass Index (BMI): 47.1 Blood Pressure (mmHg):  129/86 Reference Range: 80 - 120 mg / dl Electronic Signature(s) Signed: 06/26/2020 10:53:21 AM By: Georges Mouse, Minus Breeding RN Entered By: Georges Mouse, Minus Breeding on 06/26/2020 09:18:04

## 2020-06-28 NOTE — Assessment & Plan Note (Signed)
Uncontrolled with A1C of 10.3 today. Also not checking glucose levels at all. Poor diet. He is compliant to his prescribed regimen.  Long discussion about the importance of checking glucose levels.   Increase Lantus to 30 units. Add Trulicity 2.44 mg weekly. Continue metformin 1000 mg BID.  Urine microalbumin due and pending. Declines pneumonia vaccine. Managed on statin.  Follow up in 3 months.

## 2020-07-01 ENCOUNTER — Other Ambulatory Visit: Payer: Self-pay | Admitting: Primary Care

## 2020-07-01 DIAGNOSIS — E119 Type 2 diabetes mellitus without complications: Secondary | ICD-10-CM

## 2020-07-04 ENCOUNTER — Ambulatory Visit
Admit: 2020-07-04 | Discharge: 2020-07-05 | Payer: PRIVATE HEALTH INSURANCE | Attending: Dermatology | Primary: Dermatology

## 2020-07-04 DIAGNOSIS — L739 Follicular disorder, unspecified: Principal | ICD-10-CM

## 2020-07-04 DIAGNOSIS — L88 Pyoderma gangrenosum: Principal | ICD-10-CM

## 2020-07-04 DIAGNOSIS — Z79899 Other long term (current) drug therapy: Principal | ICD-10-CM

## 2020-07-04 DIAGNOSIS — L97222 Non-pressure chronic ulcer of left calf with fat layer exposed: Principal | ICD-10-CM

## 2020-07-05 MED ORDER — SANTYL 250 UNIT/GRAM TOPICAL OINTMENT
6 refills | 0.00000 days | Status: CP
Start: 2020-07-05 — End: ?

## 2020-07-05 NOTE — Unmapped (Signed)
It was nice to see you today! Your resident physician was Dr. Enriqueta Shutter can use hibiclens daily as a wash to the groin for a couple weeks. If you get recurrent spots, you can use this daily but otherwise it is not needed.    If any of your medications are too expensive, look for a coupon at [email or web address] or reference the application on a smartphone  - Enter the medication name, size, and your zip code to find coupons for local pharmacies.   - Print a coupon and bring it to the pharmacy, or pull up the coupon on a smartphone.  - You can also call pharmacies to ask about the cost of your medication before you pick it up.  If you still cannot afford your medication, please let us know.     Please call our clinic at [phone] with any concerns. We look forward to seeing you again!    [facility] releases most results to you as soon as they are available. Therefore, you may see some results before we do. Please give [place] 2 business days to review the tests and contact you by phone or through MyChart. If you are concerned that some results may be upsetting or confusing, you may wish to wait until we contact you before looking at the report in MyChart.  If you have an urgent question, you can send [place] a message or call our clinic. Otherwise, we prefer that you wait 2 business days for [place] to contact you.

## 2020-07-05 NOTE — Unmapped (Signed)
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   Chronic illness with exacerbation, active but slowly improving  - patient agrees to continue infliximab 10 mg/kg every 8 weeks.  Orders have already been sent to infusion center.  They are awaiting her notes.  Will fax today's note to [phone].  -Continue follow-up with wound care every 2 weeks  -Continue topical gentamicin and alone daily to wound with bandage changes.  -Start Santyl daily after gentamicin       High risk medication use: infliximab  - quant gold repeat today   - reviewed CBC and bun/cr, ast/alt 06/2020 - stable but with slight elevation of AST and ALT. Recommend he discuss this elevation with his PCP  - hepatitis panel (HBV cAb / sAb and HCV) negative 07/2019 -- HBV non-immune    Folliculitis of the left pubic area  Induration only present today.  No pustules.  -Recommend Hibiclens over-the-counter once daily until resolved.  If folliculitis is recurrent he can use this several times per week as maintenance    The patient was advised to call for an appointment should any new, changing, or symptomatic lesions develop.     RTC: Return in about 3 months (around 10/01/2020) for Dr. Elder Cyphers wound clinic. or sooner as needed   _________________________________________________________________      Chief Complaint     Chief Complaint   Patient presents with    Wound Check     f/u pt states everything is still the same       HPI     Andrew Cameron is a 40 y.o. male who presents as a returning patient (last seen No previous visit found) to [facility] for follow up of pyoderma gangrenosum.     He is currently on infliximab with topical gentamicin and triamcinolone. He reports it is healing but doing so quite slowly. He is tolerating the infliximab well.  He feels little bit tired the next day.  He also reports a bump in the groin which he thinks is folliculitis.  He does not normally get bumps like this.  This area has not drained and has not been previously treated.  Is been present for a couple weeks.      The patient denies any other new or changing lesions or areas of concern.     Pertinent Past Medical History       Problem List          Musculoskeletal and Integument    Skin ulcer of left calf with fat layer exposed (CMS-HCC) - Primary     Disease history:  -Starting about 2013, he started to develop itchy bumps that enlarge when he scratches and picks at them. This initially affected just his lower legs, but more recently prior to initial presentation to dermatology clinic, involvement spread to his thighs, buttocks, arms, and forearms.  -Areas start as tiny bumps that he then scratches. 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   -In spring 2018, an ulcer started on the L lateral calf and gradually enlarged, but was slowly healing and shrinking by the time of presentation to  our clinic. Denies any trauma at this site and notes that it started similar to other rashes on skin. Prior treatments include oral abx, including as recently as 12/2016 with courses of clinda and doxy, as well as topical steroids. He has h/o ulcerative colitis, now in remission without meds since 2015. Denies h/o liver or kidney disease or DM. Used to live in [place] when this started. Works for [employer] and is on feet all day, endorses swelling of legs. Denies new meds prior to onset.  - At his initial visit 01/22/2017, clobetasol ointment and doxycycline 100mg  bid x 4 weeks were started. A biopsy of a keratotic pruritic plaque was obtained. 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 Tissue culture grew 4+ MSSA. 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   - w/up for underlying pruritus (given patient report and difficulty not picking at the involved areas) on 10/17 revealed unremarkable HIV, hepatitis panel, LDH, TSH, free T4 . Outside slides from previous bx were reviewed by Dr. Eyvonne Left and showed venous stasis changes as well as a neutrophilic infiltrate that could be consistent with PG. He was started on topical clobetasol BID. He was continued on PO doxycycline for additional 6 weeks. His left leg ulcer continued to grow and the site of his previous biopsy on the posterior right leg has also developed a large non healing ulcer.   -10/18: w/up for underlying etiology of PG negative on 10/18: negative spep/upep, cbc w/diff consistently normal, and reports a known hx of ulcerative colitis which is in remssion  - Repeat biopsy as below done on 03/26/17 primarily c/w PG vs infectious with one potential organism on Fite stain but unclear if related.   - Patient started on prednisone on 04/01/17 with almost resolution by 7/19.  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 Stopped doxepin and gabapentin given lack of improvement  -rx sent for humira  02/2019 given worsening. Process started for renflexis in event of failure  -Evaluation by [facility] vascular - not thought to be amenable to procedural intervention  -09/30/19 - started infliximab         Pyoderma gangrenosum       Other    High risk medication use    Relevant Orders    Quantiferon TB Gold Plus          Patient Active Problem List    Diagnosis Date Noted    Normocytic anemia 07/07/2019    High risk medication use 04/01/2019    History of ulcerative colitis 06/11/2018    Ulcerative colitis (CMS-HCC) 03/05/2018    Venous stasis 02/24/2018    Type 2 diabetes mellitus (CMS-HCC) 07/15/2017    Lower extremity edema 02/20/2017    Seasonal allergic rhinitis 11/25/2016    Skin ulcer of left calf with fat layer exposed (CMS-HCC) 11/25/2016    Pyoderma gangrenosum 11/25/2016    Depression 04/17/2015    Hyperlipidemia 04/17/2015       Past Medical History, Family History, Social History, Medication List, Allergies, and Problem List were reviewed in the rooming section of Epic.     ROS: Other than symptoms mentioned in the HPI, no fevers, chills, or other skin complaints  Physical Examination     GENERAL: Well-appearing male in no acute distress, resting comfortably.  NEURO: Alert and oriented, answers questions appropriately  SKIN: Examination of the left leg and groin was performed  - large ulcer with exposure fat and muscle on the left leg (improved compared to prior photos)  - indurated dermal poorly defined nodule in the left pubis without overlying pustule            All areas not commented on are within normal limits or unremarkable      (Approved Template 01/31/2020)

## 2020-07-06 NOTE — Unmapped (Signed)
PA Initiated for Santyl 250 unit/gram through [facility]. Key: [id number]? PA Case ID: [id number]

## 2020-07-07 LAB — QUANTIFERON TB GOLD PLUS
QUANTIFERON ANTIGEN 1 MINUS NIL: 0.01 [IU]/mL
QUANTIFERON ANTIGEN 2 MINUS NIL: 0 [IU]/mL
QUANTIFERON MITOGEN: 9.96 [IU]/mL
QUANTIFERON TB GOLD PLUS: NEGATIVE
QUANTIFERON TB NIL VALUE: 0.04 [IU]/mL

## 2020-07-07 LAB — TB NIL: TB NIL VALUE: 0.04

## 2020-07-07 LAB — TB MITOGEN: TB MITOGEN VALUE: 10

## 2020-07-07 LAB — TB AG1: TB AG1 VALUE: 0.05

## 2020-07-07 LAB — TB AG2: TB AG2 VALUE: 0.04

## 2020-07-08 ENCOUNTER — Telehealth: Payer: Self-pay

## 2020-07-08 NOTE — Telephone Encounter (Signed)
Prior auth for Trulicity has been started when I tried to do the chantix online it told me did not need. Tried to call insurance but not open at this time will need to call back later.

## 2020-07-08 NOTE — Telephone Encounter (Signed)
[employer]eds has established a business relationship with the Engineer, production that results in a differentiated user experience on CoverMyMeds for the PA process, and may include patient support services. There may be lower cost medications available or preferred on your patient's plan. [name] Key: [id number] - PA Case ID: [id number] Need help? Call us at [phone] Status Sent to [employer] Trulicity 3.79JK/9.4OC pen-injectors Form [employer] Electronic Prior Authorization Form [phone] NCPDP)

## 2020-07-10 ENCOUNTER — Encounter: Payer: 59 | Admitting: Physician Assistant

## 2020-07-10 ENCOUNTER — Other Ambulatory Visit
Admission: RE | Admit: 2020-07-10 | Discharge: 2020-07-10 | Disposition: A | Discharge status: Home / Self Care | Payer: 59 | Source: Ambulatory Visit | Attending: Physician Assistant | Admitting: Physician Assistant

## 2020-07-10 ENCOUNTER — Other Ambulatory Visit: Payer: Self-pay

## 2020-07-10 DIAGNOSIS — L97321 Non-pressure chronic ulcer of left ankle limited to breakdown of skin: Secondary | ICD-10-CM | POA: Diagnosis not present

## 2020-07-10 DIAGNOSIS — L97222 Non-pressure chronic ulcer of left calf with fat layer exposed: Secondary | ICD-10-CM | POA: Diagnosis present

## 2020-07-10 NOTE — Progress Notes (Addendum)
[name], [name] ([id number]) Visit Report for 07/10/2020 Chief Complaint Document Details Patient Name: [name], [name] Date of Service: 07/10/2020 8:00 AM Medical Record Number: [id number] Patient Account Number: [id number] Date of Birth/Sex: [birth year] (40 y.o. 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 M) Treating RN: Carlene Coria Primary Care Provider: [name] Other Clinician: Referring Provider: [name] Treating Provider/Extender: [name] Cliche in Treatment: 187 History of Present Illness HPI Description: 12/04/16; 40 year old man who comes into the clinic today for review of a wound on the posterior left calf. He tells me that is been there for about a year. He is not a diabetic he does smoke half a pack per day. He was seen in the ER on 11/20/16 felt to have cellulitis around the wound and was given clindamycin. An x-ray did not show osteomyelitis. The patient initially tells me that he has a milk allergy that sets off a pruritic itching rash on his lower legs which she scratches incessantly and he thinks that's what may have set up the wound. He has been using various topical antibiotics and ointments without any effect. He works in a [employer] and is on his feet all day. He does not have a prior history  of wounds however he does have the rash on both lower legs the right arm and the ventral aspect of his left arm. These are excoriations and clearly have had scratching however there are of macular looking areas on both legs including a substantial larger area on the right leg. This does not have an underlying open area. There is no blistering. The patient tells me that 2 years ago in [place] in response to the rash on his legs he saw a dermatologist who told him he had a condition which may be pyoderma gangrenosum although I may be putting words into his mouth. He seemed to recognize this. 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 He is not in any treatment for this. He's had no recent travel 12/11/16; the patient arrives today with his wound and roughly the same condition we've been using silver alginate this is a deep punched out wound with some surrounding erythema but no tenderness. Biopsy I did did not show confirmed pyoderma gangrenosum suggested nonspecific inflammation and vasculitis but does not provide an actual description of what was seen by the pathologist. I'm really not able to understand this We have also received information from the patient's dermatologist in [place] notes from April 2016. This was a doctor Agarwal-antal. 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 He was prescribed topical steroid high potency under occlusion which helped but at this point the patient did not have a deep punched out wound. 12/18/16; the patient's wound is larger in terms of surface area however this surface looks better and there is less depth. The surrounding erythema also is better. The patient states that the wrap we put on came off 2 days ago when he has been using his compression stockings.  He we are in the process of getting a dermatology consult. 12/26/16 on evaluation today patient's left lower extremity wound shows evidence of infection with  surrounding erythema noted. He has been tolerating the dressing changes but states that he has noted more discomfort. There is a larger area of erythema surrounding the wound. No fevers, chills, nausea, or vomiting noted at this time. With that being said the wound still does have slough covering the surface. He is not allergic to any medication that he is aware of at this point. In regard to his right lower extremity he had several regions that are erythematous and pruritic he wonders if there's anything we can do to help that. 01/02/17 I reviewed patient's wound culture which was obtained his visit last week. He was placed on doxycycline at that point. Unfortunately that does not appear to be an antibiotic that would likely help with the situation however the pseudomonas noted on culture is sensitive to Cipro. Also unfortunately patient's wound seems to have a large compared to last week's evaluation. Not severely so but there are definitely increased measurements in general. He is continuing to have discomfort as well he writes this to be a seven out of 10. In fact he would prefer me not to perform any debridement today due to the fact that he is having discomfort and considering he has an active infection on the little reluctant to do so anyway. No fevers, chills, nausea, or vomiting noted at this time. 01/08/17; patient seems dermatology on September 5. I suspect dermatology will want the slides from the biopsy I did sent to their pathologist. I'm not sure if there is a way we can expedite that. In any case the culture I did before I left on vacation 3 weeks ago showed Pseudomonas he was given 10 days of Cipro and per her description of her intake nurses is actually somewhat better this week although the wound is quite a bit bigger than I remember the last time I saw this. He still has 3 more days of Cipro 01/21/17; dermatology appointment tomorrow. He has completed the ciprofloxacin for Pseudomonas.  Surface of the wound looks better however he is had some deterioration in the lesions on his right leg. Meantime the left lateral leg wound we will continue with sample 01/29/17; patient had his dermatology appointment but I can't yet see that note. He is completed his antibiotics. The wound is more superficial but considerably larger in circumferential area than when he came in. This is in his left lateral calf. He also has swollen erythematous areas with superficial wounds on the right leg and small papular areas on both arms. There apparently areas in her his upper thighs and buttocks I did not look at those. Dermatology biopsied the right leg. Hopefully will have their input next week. 02/05/17; patient went back to see his dermatologist who told him that he had a "scratching problem" as well as staph. He is now on a 30 day course of doxycycline and I believe she gave him triamcinolone cream to the right leg areas to help with the itching [not exactly sure but probably triamcinolone]. She apparently looked at the left lateral leg wound although this was not rebiopsied and I think felt to be ultimately part of the same pathogenesis. He is using sample border foam and changing nevus himself. He now has a new open area on the right posterior leg which was his biopsy site I don't have any of the dermatology notes  02/12/17; we put the patient in compression last week with SANTYL to the wound on the left leg and the biopsy. Edema is much better and the depth of the wound is now at level of skin. Area is still the same oBiopsy site on the right lateral leg we've also been using santyl with a border foam dressing and he is changing this himself. 02/19/17; Using silver alginate started last week to both the substantial left leg wound and the biopsy site on the right wound. He is tolerating compression well. Has a an appointment with his primary M.D. tomorrow wondering about diuretics although I'm wondering if  the edema problem is actually lymphedema 02/26/17; the patient has been to see his primary doctor Dr. Jerrel Ivory at [place] our primary care. She started him on Lasix 20 mg and this seems to have helped with the edema. However we are not making substantial change with the left lateral calf wound and inflammation. The biopsy site on the right leg also looks stable but not really all that different. 03/12/17; the patient has been to see vein and vascular Dr. Lucky Cowboy. He has had venous reflux studies I have not reviewed these. I did get a call from his dermatology office. They felt that he might have pathergy based on their biopsy on his right leg which led them to look at the slides of SANTOSH, PETTER ([id number]) the biopsy I did on the left leg and they wonder whether this represents pyoderma gangrenosum which was the original supposition in a man with ulcerative colitis albeit inactive for many years. They therefore recommended clobetasol and tetracycline i.e. 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 03/26/17; apparently the patient just had reflux studies not an appointment with Dr. dew. She arrives in clinic today having applied clobetasol for 2-3 weeks. He notes over the last 2-3 days excessive drainage having to change the dressing 3-4 times a day and also expanding erythema. 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 Lillia Carmel at [facility] dermatology locally who kindly saw him at my request. 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 My concern was the degree of erythema medially extending from his left leg wound which was either inflammation from pyoderma or cellulitis. I  put him on Augmentin however culture of the wound showed Pseudomonas which is quinolone sensitive. I really don't believe he has cellulitis however in view of everything I will continue and give him a course of Cipro. He is also on doxycycline as an immune modulator for the pyoderma. In addition to his original wound on the left lateral leg with surrounding erythema he has a wound on the right posterior calf which was an original biopsy site done by dermatology. 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 Saw Dr. Lillia Carmel yesterday. He has been using topical antibiotics to both wound areas his original wound on the left and the biopsies/pathergy area on the right. There is definitely some improvement in the inflammation around the wound on the right although the patient states he has increasing sensitivity of the wounds. He is on prednisone 60 and doxycycline 1 as prescribed by Dr. Lillia Carmel. He is covering the topical antibiotic with gauze  and putting this in his own compression stocks and changing this daily. He states that Dr. Lottie Rater did a culture of the left leg wound yesterday 05/07/17; pyoderma gangrenosum. The patient saw Dr. Lillia Carmel yesterday and has a follow-up with her in one month. He is still using topical antibiotics to both wounds although he can't recall exactly what type. He is still on prednisone 60 mg. Dr. Lillia Carmel stated that the doxycycline could stop if we were in agreement. 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 The right medial leg was induced by biopsy/pathergy. The area on the right is essentially healed. 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 The right medial leg has closed and remains closed. He is still on  prednisone 60. oHe tells me he missed his last dermatology appointment with Dr. Lillia Carmel but will make another appointment. He reports that her blood sugar at a recent screen in [place] was high 200's. He was 180 today. 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 I think he is going to require still much longer prednisone perhaps another 3 months before attempting to taper. In the meantime his wound is a lot better. Smaller. He is cleaning this off daily and applying topical antibiotics. When he was last in the clinic I thought about changing to [facility] and actually put in a couple of calls to dermatology although probably not during their business hours. 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 He continues on oral prednisone. He has been using triple antibiotic ointment. There is surface debris and we will transition to [facility] and have him return in 2 weeks. He has lost 30 pounds since his last appointment with lifestyle modification. He may benefit from topical steroid cream for treatment this can be considered at a later date. 08/22/17 on evaluation today patient appears to actually be doing rather well in regard to his left lateral lower extremity ulcer. He has actually been managed by Dr. Dellia Nims most recently. Patient is currently on oral steroids at this time. This seems to have been of benefit for him. Nonetheless his last visit was actually with Leah on 08/06/17. Currently he is not utilizing any topical steroid creams although this could be of benefit as well. No fevers, chills, nausea, or vomiting noted at this time. 09/05/17 on evaluation today patient appears to be doing better in regard to his left lateral lower extremity ulcer. He has been tolerating the dressing changes without complication. He is using Santyl with good effect. Overall I'm very  pleased with how things are standing at this point. Patient likewise is happy that this is doing better. 09/19/17 on evaluation today patient actually appears to be doing rather well in regard to his left lateral lower extremity ulcer. 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 He's not having any significant pain. 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 He has been tolerating the Santyl without complication and in general I feel like he's making good progress. 10/17/17 on evaluation today patient  appears to be doing very well in regard to his left lateral lower surety ulcer. He has been tolerating the dressing changes without complication. There does not appear to be any evidence of infection he's alternating the Santyl and the triple antibiotic ointment every other day this seems to be doing well for him. 11/03/17 on evaluation today patient appears to be doing very well in regard to his left lateral lower extremity ulcer. He is been tolerating the dressing changes without complication which is good news. Fortunately there does not appear to be any evidence of infection which is also great news. Overall is doing excellent they are starting to taper down on the prednisone is down to 40 mg at this point it also started topical clobetasol for him. 11/17/17 on evaluation today patient appears to be doing well in regard to his left lateral lower surety ulcer. He's been tolerating the dressing changes without complication. He does note that he is having no pain, no excessive drainage or discharge, and overall he feels like things are going about how he would expect and hope they would. Overall he seems to have no evidence of infection at this time in my opinion which is good news. 12/04/17-He is seen in follow-up evaluation  for right lateral lower extremity ulcer. He has been applying topical steroid cream. Today's measurement show slight increase in size. Over the next 2 weeks we will transition to every other day Santyl and steroid cream. He has been encouraged to monitor for changes and notify clinic with any concerns 12/15/17 on evaluation today patient's left lateral motion the ulcer and fortunately is doing worse again at this point. This just since last week to this week has close to doubled in size according to the patient. I did not seeing last week's I do not have a visual to compare this to in our system was also down so we do not have all the charts and at this point. Nonetheless it does have me somewhat concerned in regard to the fact that again he was worried enough about it he has contact the dermatology that placed them back on the full strength, 50 mg a day of the prednisone that he was taken previous. He continues to alternate using clobetasol along with Santyl at this point. He is obviously somewhat frustrated. 12/22/17 on evaluation today patient appears to be doing a little worse compared to last evaluation. Unfortunately the wound is a little deeper and slightly larger than the last week's evaluation. With that being said he has made some progress in regard to the irritation surrounding at this time unfortunately despite that progress that's been made he still has a significant issue going on here. 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 [name], [name] ([id number]) For that reason I discussed with him today the possibility of perform a wound culture to ensure there's nothing overtly infected. 01/06/18 on evaluation today patient's wound is larger and deeper than previously evaluated. With that being said it did appear that his wound was infected after  my last evaluation with him. Subsequently I did end up prescribing a prescription for Bactrim DS which she has been taking and having no complication with. Fortunately there does not appear to be any evidence of infection at this point in time as far as anything spreading, no want to touch, and overall I feel like  things are showing signs of improvement. 01/13/18 on evaluation today patient appears to be even a little larger and deeper than last time. There still muscle exposed in the base of the wound. Nonetheless he does appear to be less erythematous I do believe inflammation is calming down also believe the infection looks like it's probably resolved at this time based on what I'm seeing. No fevers, chills, nausea, or vomiting noted at this time. 01/30/18 on evaluation today patient actually appears to visually look better for the most part. Unfortunately those visually this looks better he does seem to potentially have what may be an abscess in the muscle that has been noted in the central portion of the wound. This is the first time that I have noted what appears to be fluctuance in the central portion of the muscle. With that being said I'm somewhat more concerned about the fact that this might indicate an abscess formation at this location. I do believe that an ultrasound would be appropriate. This is likely something we need to try to do as soon as possible. He has been switch to mupirocin ointment and he is no longer using the steroid ointment as prescribed by dermatology he sees them again next week he's been decreased from 60 to 40 mg of prednisone. 03/09/18 on evaluation today patient actually appears to be doing a little better compared to last time I saw him. There's not as much erythema surrounding the wound itself. He I did review his most recent infectious disease note which was dated 02/24/18. He saw Dr. Michel Bickers in [place]. 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 Patient is now off of antibiotics and they are continually observing this. There seems to be no change in the past two weeks in my pinion based on what the patient says and what I see today compared to what Dr. Megan Salon likely saw two weeks ago. No fevers, chills, nausea, or vomiting noted at this time. 03/23/18 on evaluation today patient's wound actually appears to be showing signs of improvement which is good news. He is currently still on the Dapsone. He is also working on tapering the prednisone to get off of this and Dr. Lottie Rater is working with him in this regard. Nonetheless overall I feel like the wound is doing well it does appear based on the infectious disease note that I reviewed from Dr. 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 I did also review the results of his ultrasound of left lower extremity which revealed there was a dentist tissue in the base of the wound without an abscess noted. 04/06/18 on evaluation today the patient's left lateral lower extremity ulcer actually appears to be doing fairly well which is excellent news. There does not appear to be any evidence of infection at this time which is also great news. Overall he still does have a significantly large ulceration although little by little he seems to be making progress. He is down to 10 mg a day of the prednisone. 04/20/18 on evaluation today patient actually appears to be doing excellent at this time in regard to his left lower extremity ulcer. He's making signs of good progress unfortunately this is taking much longer than we would really like to see but nonetheless he is  making progress. Fortunately there does not appear to be any evidence of infection at this time. No fevers, chills, nausea, or vomiting noted at this time.  The patient has not been using the Santyl due to the cost he hadn't got in this field yet. He's mainly been using the antibiotic ointment topically. Subsequently he also tells me that he really has not been scrubbing in the shower I think this would be helpful again as I told him it doesn't have to be anything too aggressive to even make it believe just enough to keep it free of some of the loose slough and biofilm on the wound surface. 05/11/18 on evaluation today patient's wound appears to be making slow but sure progress in regard to the left lateral lower extremity ulcer. He is been tolerating the dressing changes without complication. Fortunately there does not appear to be any evidence of infection at this time. He is still just using triple antibiotic ointment along with clobetasol occasionally over the area. He never got the Santyl and really does not seem to intend to in my pinion. 06/01/18 on evaluation today patient appears to be doing a little better in regard to his left lateral lower extremity ulcer. He states that overall he does not feel like he is doing as well with the Dapsone as he did with the prednisone. Nonetheless he sees his dermatologist later today and is gonna talk to them about the possibility of going back on the prednisone. Overall again I believe that the wound would be better if you would utilize Santyl but he really does not seem to be interested in going back to the [place] at this point. He has been using triple antibiotic ointment. 06/15/18 on evaluation today patient's wound actually appears to be doing about the same at this point. Fortunately there is no signs of infection at this time. He has made slight improvements although he continues to not really want to clean the wound bed at this point. He states that he just doesn't mess with it he doesn't want to cause any problems with everything else he has going on. He has been on medication, antibiotics as prescribed  by his dermatologist, for a staff infection of his lower extremities which is really drying out now and looking much better he tells me. Fortunately there is no sign of overall infection. 06/29/18 on evaluation today patient appears to be doing well in regard to his left lateral lower surety ulcer all things considering. Fortunately his staff infection seems to be greatly improved compared to previous. He has no signs of infection and this is drying up quite nicely. He is still the doxycycline for this is no longer on cental, Dapsone, or any of the other medications. His dermatologist has recommended possibility of an infusion but right now he does not want to proceed with that. 07/13/18 on evaluation today patient appears to be doing about the same in regard to his left lateral lower surety ulcer. Fortunately there's no signs of infection at this time which is great news. Unfortunately he still builds up a significant amount of Slough/biofilm of the surface of the wound he still is not really cleaning this as he should be appropriately. Again I'm able to easily with saline and gauze remove the majority of this on the surface which if you would do this at home would likely be a dramatic improvement for him as far as getting the area to improve. Nonetheless overall I still feel like he  is making progress is just very slow. I think Santyl will be of benefit for him as well. Still he has not gotten this as of this point. 07/27/18 on evaluation today patient actually appears to be doing little worse in regards of the erythema around the periwound region of the wound he also tells me that he's been having more drainage currently compared to what he was experiencing last time I saw him. He states not quite as bad as what he had because this was infected previously but nonetheless is still appears to be doing poorly. Fortunately there is no evidence of systemic infection at this point. The patient tells me that  he is not going to be able to afford the Santyl. He is still waiting to hear about the infusion therapy with his dermatologist. Apparently she wants an updated colonoscopy first. 08/10/18 on evaluation today patient appears to be doing better in regard to his left lateral lower extremity ulcer. Fortunately he is showing signs of improvement in this regard he's actually been approved for Remicade infusion's as well although this has not been scheduled as of yet. Fortunately there's no signs of active infection at this time in regard to the wound although he is having some issues with infection of the right lower extremity is been seen as dermatologist for this. Fortunately they are definitely still working with him trying to keep things under control. [name], [name] ([id number]) 09/07/18 on evaluation today patient is actually doing rather well in regard to his left lateral lower extremity ulcer. He notes these actually having some hair grow back on his extremity which is something he has not seen in years. He also tells me that the pain is really not giving them any trouble at this time which is also good news overall she is very pleased with the progress he's using a combination of the mupirocin along with the probate is all mixed. 09/21/18 on evaluation today patient actually appears to be doing fairly well all things considered in regard to his looks from the ulcer. He's been tolerating the dressing changes without complication. Fortunately there's no signs of active infection at this time which is good news he is still on all antibiotics or prevention of the staff infection. He has been on prednisone for time although he states it is gonna contact his dermatologist and see if she put them on a short course due to some irritation that he has going on currently. Fortunately there's no evidence of any overall worsening this is going very slow I think cental would be something that would be helpful for him  although he states that $50 for tube is quite expensive. He therefore is not willing to get that at this point. 10/06/18 on evaluation today patient actually appears to be doing decently well in regard to his left lateral leg ulcer. He's been tolerating the dressing changes without complication. Fortunately there's no signs of active infection at this time. Overall I'm actually rather pleased with the progress he's making although it's slow he doesn't show any signs of infection and he does seem to be making some improvement. I do believe that he may need a switch up and dressings to try to help this to heal more appropriately and quickly. 10/19/18 on evaluation today patient actually appears to be doing better in regard to his left lateral lower extremity ulcer. This is shown signs of having much less Slough buildup at this point due to the fact he has been using  the Santyl. Obviously this is very good news. The overall size of the wound is not dramatically smaller but again the appearance is. 11/02/18 on evaluation today patient actually appears to be doing quite well in regard to his lower [place] ulcer. A lot of the skin around the ulcer is actually somewhat irritating at this point this seems to be more due to the dressing causing irritation from the adhesive that anything else. Fortunately there is no signs of active infection at this time. 11/24/18 on evaluation today patient appears to be doing a little worse in regard to his overall appearance of his lower extremity ulcer. There's more erythema and warmth around the wound unfortunately. He is currently on doxycycline which he has been on for some time. With that being said I'm not sure that seems to be helping with what appears to possibly be an acute cellulitis with regard to his left lower extremity ulcer. No fevers, chills, nausea, or vomiting noted at this time. 12/08/18 on evaluation today patient's wounds actually appears to be doing  significantly better compared to his last evaluation. He has been using Santyl along with alternating tripling about appointment as well as the steroid cream seems to be doing quite well and the wound is showing signs of improvement which is excellent news. Fortunately there's no evidence of infection and in fact his culture came back negative with only normal skin flora noted. 12/21/2018 upon evaluation today patient actually appears to be doing excellent with regard to his ulcer. This is actually the best that I have seen it since have been helping to take care of him. It is both smaller as well as less slough noted on the surface of the wound and seems to be showing signs of good improvement with new skin growing from the edges. He has been using just the triamcinolone he does wonder if he can get a refill of that ointment today. 01/04/2019 upon evaluation today patient actually appears to be doing well with regard to his left lateral lower extremity ulcer. With that being said it does not appear to be that he is doing quite as well as last time as far as progression is concerned. There does not appear to be any signs of infection or significant irritation which is good news. With that being said I do believe that he may benefit from switching to a collagen based dressing based on how clean The wound appears. 01/18/2019 on evaluation today patient actually appears to be doing well with regard to his wound on the left lower extremity. He is not made a lot of progress compared to where we were previous but nonetheless does seem to be doing okay at this time which is good news. There is no signs of active infection which is also good news. My only concern currently is I do wish we can get him into utilizing the collagen dressing his insurance would not pay for the supplies that we ordered although it appears that he may be able to order this through his supply company that he typically utilizes. This is  [employer]. Nonetheless he did try to order it during the office visit today and it appears this did go through. We will see if he can get that it is a different brand but nonetheless he has collagen and I do think will be beneficial. 02/01/2019 on evaluation today patient actually appears to be doing a little worse today in regard to the overall size of his wounds. Fortunately there  is no signs of active infection at this time. That is visually. Nonetheless when this is happened before it was due to infection. For that reason were somewhat concerned about that this time as well. 02/08/2019 on evaluation today patient unfortunately appears to be doing slightly worse with regard to his wound upon evaluation today. Is measuring a little deeper and a little larger unfortunately. I am not really sure exactly what is causing this to enlarge he actually did see his dermatologist she is going to see about initiating Humira for him. Subsequently she also did do steroid injections into the wound itself in the periphery. Nonetheless still nonetheless he seems to be getting a little bit larger he is gone back to just using the steroid cream topically which I think is appropriate. I would say hold off on the collagen for the time being is definitely a good thing to do. Based on the culture results which we finally did get the final result back regarding it shows staph as the bacteria noted again that can be a normal skin bacteria based on the fact however he is having increased drainage and worsening of the wound measurement wise I would go ahead and place him on an antibiotic today I do believe for this. 02/15/2019 on evaluation today patient actually appears to be doing somewhat better in regard to his ulcer. There is no signs of worsening at this time I did review his culture results which showed evidence of Staphylococcus aureus but not MRSA. Again this could just be more related to the normal skin bacteria  although he states the drainage has slowed down quite a bit he may have had a mild infection not just colonization. And was much smaller and then since around10/04/2019 on evaluation today patient appears to be doing unfortunately worse as far as the size of the wound. I really feel like that this is steadily getting larger again it had been doing excellent right at the beginning of September we have seen a steady increase in the area of the wound it is almost 2-1/2 times the size it was on September 1. Obviously this is a bad trend this is not wanting to see. For that reason we went back to using just the topical triamcinolone cream which does seem to help with inflammation. I checked him for bacteria by way of culture and nothing showed positive there. I am considering giving him a short course of a tapering steroid Dosepak today to see if that is can be beneficial for him. The patient is in agreement with giving that a try. 03/08/2019 on evaluation today patient appears to be doing very well in comparison to last evaluation with regard to his lower extremity ulcer. This is showing signs of less inflammation and actually measuring slightly smaller compared to last time every other week over the past month and a half he has been measuring larger larger larger. Nonetheless I do believe that the issue has been inflammation the prednisone does seem to [facility], Javis ([id number]) have been beneficial for him which is good news. No fevers, chills, nausea, vomiting, or diarrhea. 03/22/2019 on evaluation today patient appears to be doing about the same with regard to his leg ulcer. He has been tolerating the dressing changes without complication. With that being said the wound seems to be mostly arrested at its current size but really is not making any progress except for when we prescribed the prednisone. He did show some signs of dropping as far as  the overall size of the wound during that interval week.  Nonetheless this is something he is not on long-term at this point and unfortunately I think he is getting need either this or else the Humira which his dermatologist has discussed try to get approval for. With that being said he will be seeing his dermatologist on the 11th of this month that is November. 04/19/2019 on evaluation today patient appears to be doing really about the same the wound is measuring slightly larger compared to last time I saw him. He has not been into the office since November 2 due to the fact that he unfortunately had Covid as that his entire family. He tells me that it was rough but they did pull-through and he seems to be doing much better. Fortunately there is no signs of active infection at this time. No fevers, chills, nausea, vomiting, or diarrhea. 05/10/2019 on evaluation today patient unfortunately appears to be doing significantly worse as compared to last time I saw him. He does tell me that he has had his first dose of Humira and actually is scheduled to get the next one in the upcoming week. With that being said he tells me also that in the past several days he has been having a lot of issues with green drainage she showed me a picture this is more blue-green in color. He is also been having issues with increased sloughy buildup and the wound does appear to be larger today. Obviously this is not the direction that we want everything to take based on the starting of his Humira. Nonetheless I think this is definitely a result of likely infection and to be honest I think this is probably Pseudomonas causing the infection based on what I am seeing. 05/24/2019 on evaluation today patient unfortunately appears to be doing significantly worse compared to his prior evaluation with me 2 weeks ago. I did review his culture results which showed that he does have Staph aureus as well as Pseudomonas noted on the culture. Nonetheless the Levaquin that I prescribed for him does  not appear to have been appropriate and in fact he tells me he is no longer experiencing the green drainage and discharge that he had at the last visit. Fortunately there is no signs of active infection at this time which is good news although the wound has significantly worsened it in fact is much deeper than it was previous. We have been utilizing up to this point triamcinolone ointment as the prescription topical of choice but at this time I really feel like that the wound is getting need to be packed in order to appropriately manage this due to the deeper nature of the wound. Therefore something along the lines of an alginate dressing may be more appropriate. 05/31/2019 upon inspection today patient's wound actually showed signs of doing poorly at this point. Unfortunately he just does not seem to be making any good progress despite what we have tried. He actually did go ahead and pick up the Cipro and start taking that as he was noticing more green drainage he had previously completed the Levaquin that I prescribed for him as well. Nonetheless he missed his appointment for the seventh last week on Wednesday with the wound care center and [facility] where his dermatologist referred him. Obviously I do think a second opinion would be helpful at this point especially in light of the fact that the patient seems to be doing so poorly despite the fact  that we have tried everything that I really know how at this point. The only thing that ever seems to have helped him in the past is when he was on high doses of continual steroids that did seem to make a difference for him. 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 06/07/2019 upon evaluation today patient unfortunately appears to be doing worse yet again with regard to his wound. In fact I am  starting to question whether or not he may have a fluid pocket in the muscle at this point based on the bulging and the soft appearance to the central portion of the muscle area. There is not anything draining from the muscle itself at this time which is good news but nonetheless the wound is expanding. I am not really seeing any results of the Humira as far as overall wound progression based on what I am seeing at this point. The patient has been referred for second opinion with regard to his wound to the [name] wound care center by his dermatologist which I definitely am not in opposition to. Unfortunately we tried multiple dressings in the past including collagen, alginate, and at one point even Hydrofera Blue. With that being said he is never really used it for any significant amount of time due to the fact that he often complains of pain associated with these dressings and then will go back to either using the Santyl which she has done intermittently or more frequently the triamcinolone. He is also using his own compression stockings. We have wrapped him in the past but again that was something else that he really was not a big fan of. 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 He seemed to do much better when he was on the steroids although honestly I understand that the steroids are not necessarily the best medication to be on long-term obviously 06/14/2019 on evaluation today patient appears to be doing actually a little bit better with regard to the overall appearance with his leg. Unfortunately he does continue to have issues with what appears to be some fluid underneath the  muscle although he did see the wound specialty center at [facility] last week their main goals were to see about infusion therapy in place of the Humira as they feel like that is not quite strong enough. They also recommended that we continue with the treatment otherwise as we are they felt like that was appropriate and they are okay with him continuing to follow-up here with [place] in that regard. With that being said they are also sending him to the vein specialist there to see about vein stripping and if that would be of benefit for him. Subsequently they also did not really address whether or not an ultrasound of the muscle area to see if there is anything that needs to be addressed here would be appropriate or not. For that reason I discussed this with him last week I think we  may proceed down that road at this point. 06/21/2019 upon evaluation today patient's wound actually appears to be doing slightly better compared to previous evaluations. I do believe that he has made a difference with regard to the progression here with the use of oral steroids. Again in the past has been the only thing that is really calm things down. He does tell me that from [facility] is gotten a good news from there that there are no further vein stripping that is necessary at this point. I do not have that available for review today although the patient did relay this to me. He also did obtain and have the ultrasound of the wound completed which I did sign off on today. It does appear that there is no fluid collection under the muscle this is likely then just edematous tissue in general. That is also good news. Overall I still believe the inflammation is the main issue here. He did inquire about the possibility of a wound VAC again with the muscle protruding like it is I am not really sure whether the wound VAC is necessarily ideal or not. That is something we will have to consider although I do believe he may need compression wrapping to  try to help with edema control which could potentially be of benefit. 06/28/2019 on evaluation today patient appears to be doing slightly better measurement wise although this is not terribly smaller he least seems to be trending towards that direction. With that being said he still seems to have purulent drainage noted in the wound bed at this time. He has been on Levaquin followed by Cipro over the past month. Unfortunately he still seems to have some issues with active infection at this time. I did perform a culture last week in order to evaluate and see if indeed there was still anything going on. Subsequently the culture did come back showing Pseudomonas which is consistent with the drainage has been having which is blue-green in color. He also has had an odor that again was somewhat consistent with Pseudomonas as well. Long story short it appears that the culture showed an intermediate finding with regard to how well the Cipro will work for the Pseudomonas infection. Subsequently being that he does not seem to be clearing up and at best what we are doing is just keeping this at [place] I think he may need to see infectious disease to discuss IV antibiotic options. [name], [name] ([id number]) 07/05/2019 upon evaluation today patient appears to be doing okay in regard to his leg ulcer. He has been tolerating the dressing changes at this point without complication. Fortunately there is no signs of active infection at this time which is good news. No fevers, chills, nausea, vomiting, or diarrhea. With that being said he does have an appointment with infectious disease tomorrow and his primary care on Wednesday. Again the reason for the infectious disease referral was due to the fact that he did not seem to be fully resolving with the use of oral antibiotics and therefore we were thinking that IV antibiotic therapy may be necessary secondary to the fact that there was an intermediate finding for  how effective the Cipro may be. Nonetheless again he has been having a lot of purulent and even green drainage. Fortunately right now that seems to have calmed down over the past week with the reinitiation of the oral antibiotic. Nonetheless we will see what Dr. Megan Salon has to say. 07/12/2019 upon evaluation today patient appears to be  doing about the same at this point in regard to his left lower extremity ulcer. Fortunately there is no signs of active infection at this time which is good news I do believe the Levaquin has been beneficial I did review Dr. Hale Bogus note and to be honest I agree that the patient's leg does appear to be doing better currently. What we found in the past as he does not seem to really completely resolve he will stop the antibiotic and then subsequently things will revert back to having issues with blue-green drainage, increased pain, and overall worsening in general. Obviously that is the reason I sent him back to infectious disease. 07/19/2019 upon evaluation today patient appears to be doing roughly the same in size there is really no dramatic improvement. He has started back on the Levaquin at this point and though he seems to be doing okay he did still have a lot of blue/green drainage noted on evaluation today unfortunately. I think that this is still indicative more likely of a Pseudomonas infection as previously noted and again he does see Dr. Megan Salon in just a couple of days. I do not know that were really able to effectively clear this with just oral antibiotics alone based on what I am seeing currently. Nonetheless we are still continue to try to manage as best we can with regard to the patient and his wound. I do think the wrap was helpful in decreasing the edema which is excellent news. No fevers, chills, nausea, vomiting, or diarrhea. 07/26/2019 upon evaluation today patient appears to be doing slightly better with regard to the overall appearance of the muscle  there is no dark discoloration centrally. Fortunately there is no signs of active infection at this time. No fevers, chills, nausea, vomiting, or diarrhea. Patient's wound bed currently the patient did have an appointment with Dr. Megan Salon at infectious disease last week. With that being said Dr. Megan Salon the patient states was still somewhat hesitant about put him on any IV antibiotics he wanted [place] to repeat cultures today and then see where things go going forward. He does look like Dr. Megan Salon because of some improvement the patient did have with the Levaquin wanted [place] to see about repeating cultures. If it indeed grows the Pseudomonas again then he recommended a possibility of considering a PICC line placement and IV antibiotic therapy. He plans to see the patient back in 1 to 2 weeks. 08/02/2019 upon evaluation today patient appears to be doing poorly with regard to his left lower extremity. We did get the results of his culture back it shows that he is still showing evidence of Pseudomonas which is consistent with the purulent/blue-green drainage that he has currently. Subsequently the culture also shows that he now is showing resistance to the oral fluoroquinolones which is unfortunate as that was really the only thing to treat the infection prior. I do believe that he is looking like this is going require IV antibiotic therapy to get this under control. Fortunately there is no signs of systemic infection at this time which is good news. The patient does see Dr. Megan Salon tomorrow. 08/09/2019 upon evaluation today patient appears to be doing better with regard to his left lower extremity ulcer in regard to the overall appearance. He is currently on IV antibiotic therapy. As ordered by Dr. Megan Salon. Currently the patient is on ceftazidime which she is going to take for the next 2 weeks and then follow-up for 4 to 5-week appointment with Dr. Megan Salon.  The patient started this this past Friday  symptoms have not for a total of 3 days currently in full. 08/16/2019 upon evaluation today patient's wound actually does show muscle in the base of the wound but in general does appear to be much better as far as the overall evidence of infection is concerned. In fact I feel like this is for the most part cleared up he still on the IV antibiotics he has not completed the full course yet but I think he is doing much better which is excellent news. 08/23/2019 upon evaluation today patient appears to be doing about the same with regard to his wound at this point. He tells me that he still has pain unfortunately. Fortunately there is no evidence of systemic infection at this time which is great news. There is significant muscle protrusion. 09/13/19 upon evaluation today patient appears to be doing about the same in regard to his leg unfortunately. He still has a lot of drainage coming from the ulceration there is still muscle exposed. With that being said the patient's last wound culture still showed an intermediate finding with regard to the Pseudomonas he still having the bluish/green drainage as well. Overall I do not know that the wound has completely cleared of infection at this point. Fortunately there is no signs of active infection systemically at this point which is good news. 09/20/2019 upon evaluation today patient's wound actually appears to be doing about the same based on what I am seeing currently. I do not see any signs of systemic infection he still does have evidence of some local infection and drainage. He did see Dr. Megan Salon last week and Dr. Megan Salon states that he probably does need a different IV antibiotic although he does not want to put him on this until the patient begins the Remicade infusion which is actually scheduled for about 10 days out from today on 13 May. Following that time Dr. Megan Salon is good to see him back and then will evaluate the feasibility of starting him on the  IV antibiotic therapy once again at that point. I do not disagree with this plan I do believe as Dr. Megan Salon stated in his note that I reviewed today that the patient's issue is multifactorial with the pyoderma being 1 aspect of this that were hoping the Remicade will be helpful for her. In the meantime I think the gentamicin is, helping to keep things under decent okay control in regard to the ulcer. 09/27/2019 upon evaluation today patient appears to be doing about the same with regard to his wound still there is a lot of muscle exposure though he does have some hyper granulation tissue noted around the edge and actually some granulation tissue starting to form over the muscle which is actually good news. Fortunately there is no evidence of active infection which is also good news. His pain is less at this point. 5/21; this is a patient I have not seen in a long time. 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 He has a large wound on the left lateral leg with protruding muscle. He comes in the clinic today showing the same area on his left medial ankle. He says there is been a spot there for some time although we have not previously defined this. Today he has a clearly defined area with slight amount of skin breakdown surrounded by raised areas with a purplish hue in color. This is not painful he says it is irritated.  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 He still has muscle protruding from the lateral portion of his left leg but fortunately the new area that they were concerned about at his last visit does not appear to have opened at this point. He is currently on Remicade infusions and seems to be doing better in my opinion in fact the wound itself seems to be overall much better. The purplish discoloration that he did have seems to have resolved  and I think that is a good sign that hopefully the Remicade is doing its job. He does have some biofilm noted over the surface of the wound. 11/01/2019 on evaluation today patient's wound actually appears to be doing excellent at this time. Fortunately there is no evidence of active infection and overall I feel like he is making great progress. The Remicade seems to be due excellent job in my opinion. [name], [name] ([id number]) 11/08/19 evaluation today vision actually appears to be doing quite well with regard to his weight ulcer. He's been tolerating dressing changes without complication. Fortunately there is no evidence of infection. No fevers, chills, nausea, or vomiting noted at this time. Overall states that is having more itching than pain which is actually a good sign in my opinion. 12/13/2019 upon evaluation today patient appears to be doing well today with regard to his wound. He has been tolerating the dressing changes without complication. Fortunately there is no sign of active infection at this time. No fevers, chills, nausea, vomiting, or diarrhea. Overall I feel like the infusion therapy has been very beneficial for him. 01/06/2020 on evaluation today patient appears to be doing well with regard to his wound. This is measuring smaller and actually looks to be doing better. Fortunately there is no signs of active infection at this point. No fevers, chills, nausea, vomiting, or diarrhea. With that being said he does still have the blue-green drainage but this does not seem to be causing any significant issues currently. He has been using the gentamicin that does seem to be keeping things under decent control at this point. He goes later this morning for his next infusion therapy for the pyoderma which seems to also be very beneficial. 02/07/2020 on evaluation today patient appears to be doing about the same in regard to his wounds currently. Fortunately there is no signs of active infection  systemically he does still have evidence of local infection still using gentamicin. He also is showing some signs of improvement albeit slowly I do feel like we are making some progress here. 02/21/2020 upon evaluation today patient appears to be making some signs of improvement the wound is measuring a little bit smaller which is great news and overall I am very pleased with where he stands currently. He is going to be having infusion therapy treatment on the 15th of this month. Fortunately there is no signs of active infection at this time. 03/13/2020 I do believe patient's wound is actually showing some signs of improvement here which is great news. He has continue with the infusion therapy through rheumatology/dermatology at [facility]. That does seem to be beneficial. I still think he gets as much benefit from this as he did from the prednisone initially but nonetheless obviously this is less harsh on his body that the prednisone as far as they are concerned. 03/31/2020 on evaluation today patient's wound actually showing signs of some pretty good improvement in regard  to the overall appearance of the wound bed. There is still muscle exposed though he does have some epithelial growth around the edges of the wound. Fortunately there is no signs of active infection at this time. No fevers, chills, nausea, vomiting, or diarrhea. 04/24/2020 upon evaluation today patient appears to be doing about the same in regard to his leg ulcer. He has been tolerating the dressing changes without complication. Fortunately there is no signs of active infection at this time. No fevers, chills, nausea, vomiting, or diarrhea. With that being said he still has a lot of irritation from the bandaging around the edges of the wound. We did discuss today the possibility of a referral to plastic surgery. 05/22/2020 on evaluation today patient appears to be doing well with regard to his wounds all things considered. He has not been able  to get the Chantix apparently there is a recall nurse that I was unaware of put out by [employer] involuntarily. Nonetheless for now I am and I have to do some research into what may be the best option for him to help with quitting in regard to smoking and we discussed that today. 06/26/2020 upon evaluation today patient appears to be doing well with regard to his wound from the standpoint of infection I do not see any signs of infection at this point. With that being said unfortunately he is still continuing to have issues with muscle exposure and again he is not having a whole lot of new skin growth unfortunately. There does not appear to be any signs of active infection at this time. No fevers, chills, nausea, vomiting, or diarrhea. 07/10/2020 upon evaluation today patient appears to be doing a little bit more poorly currently compared to where he was previous. I am concerned currently about an active infection that may be getting worse especially in light of the increased size and tenderness of the wound bed. No fevers, chills, nausea, vomiting, or diarrhea. Electronic Signature(s) Signed: 07/10/2020 8:44:24 AM By: Worthy Keeler PA-C Entered By: Worthy Keeler on 07/10/2020 08:44:24 [name], [name] ([id number]) -------------------------------------------------------------------------------- Physical Exam Details Patient Name: [name] Date of Service: 07/10/2020 8:00 AM Medical Record Number: [id number] Patient Account Number: [id number] Date of Birth/Sex: 02-May-1981 (39 y.o. M) Treating RN: Carlene Coria Primary Care Provider: [name] Other Clinician: Referring Provider: [name] Treating Provider/Extender: [name] Cliche in Treatment: 17 Constitutional Well-nourished and well-hydrated in no acute distress. Respiratory normal breathing without difficulty. Psychiatric this patient is able to make decisions and demonstrates good insight into disease process. Alert and  Oriented x 3. pleasant and cooperative. Notes Upon inspection patient's wound actually appears to have expanded a little bit and there does appear to be some breakdown here. It is also extremely tender to touch which is unfortunate. With that being said I do not see any signs of active infection at this time which is good news. No fevers, chills, nausea, vomiting, or diarrhea. Electronic Signature(s) Signed: 07/10/2020 8:44:59 AM By: Worthy Keeler PA-C Entered By: Worthy Keeler on 07/10/2020 08:44:59 [name], [name] ([id number]) -------------------------------------------------------------------------------- Physician Orders Details Patient Name: [name] Date of Service: 07/10/2020 8:00 AM Medical Record Number: [id number] Patient Account Number: [id number] Date of Birth/Sex: 1980/07/14 (39 y.o. 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 Wound Treatment Wound #1 - Lower Leg Wound Laterality: Left, Lateral Cleanser: Soap and Water 1 x Per Day/30 Days Discharge Instructions: Gently cleanse wound with antibacterial soap, rinse and pat dry prior to dressing wounds Primary Dressing: Algicell Calcium Alginate Dressing 4x4 (in/in) 1 x Per Day/30 Days Discharge Instructions: apply to wound bed Primary Dressing: gentamicin ointment (Generic) 1 x Per Day/30 Days Discharge  Instructions: apply to wound bed Secondary Dressing: Bordered Gauze Sterile-HBD 4x4 (in/in) 1 x Per Day/30 Days Discharge Instructions: Cover wound with Bordered Guaze Sterile as directed Laboratory o Bacteria identified in Wound by Culture (MICRO) - non healing wound left lower leg - (ICD10 F11.021 - Non-pressure chronic ulcer of left calf with fat layer exposed) oooo LOINC Code: 1173-5 oooo Convenience Name: Wound culture routine Electronic Signature(s) Signed: 07/10/2020 4:57:18 PM By: Carlene Coria RN Signed: 07/10/2020 5:46:59 PM By: Worthy Keeler PA-C Entered By: Carlene Coria on 07/10/2020 09:00:13 [name] ([id number]) -------------------------------------------------------------------------------- Problem List Details Patient Name: [name] Date of Service: 07/10/2020 8:00 AM Medical Record Number: [id number] Patient Account Number: [id number] Date of Birth/Sex: 11-22-1980 (39 y.o. M) Treating RN: Carlene Coria Primary Care Provider: [name] Other Clinician: Referring Provider: [name] Treating Provider/Extender: [name] Cliche in Treatment: 187 Active Problems ICD-10 Encounter Code Description Active Date MDM Diagnosis L97.222 Non-pressure chronic ulcer of left calf with fat layer exposed 12/04/2016 No Yes L88 Pyoderma gangrenosum 03/26/2017 No Yes L97.321 Non-pressure chronic ulcer of left ankle limited to breakdown of skin 10/08/2019 No Yes I87.2 Venous insufficiency (chronic) (peripheral) 12/04/2016 No Yes L03.116 Cellulitis of left lower limb 05/24/2019 No Yes F17.208 Nicotine dependence, unspecified, with other nicotine-induced disorders 04/24/2020 No Yes Inactive Problems ICD-10 Code Description Active Date Inactive Date L97.213 Non-pressure chronic ulcer of right calf with necrosis of muscle 04/02/2017 04/02/2017 Resolved Problems Electronic Signature(s) Signed: 07/10/2020 8:31:20 AM By: Worthy Keeler PA-C Entered By: Worthy Keeler on  07/10/2020 08:31:20 Prim, [name] ([id number]) -------------------------------------------------------------------------------- Progress Note Details Patient Name: [name] Date of Service: 07/10/2020 8:00 AM Medical Record Number: [id number] Patient Account Number: [id number] Date of Birth/Sex: 1980-10-08 (40 y.o. M) Treating RN: Carlene Coria Primary Care Provider: [name] Other Clinician: Referring Provider: [name] Treating Provider/Extender: [name] Cliche in Treatment: 187 Subjective Chief Complaint Information obtained from Patient He is here in follow up evaluation for LLE pyoderma ulcer History of Present Illness (HPI) 12/04/16; 40 year old man who comes into the clinic today for review of a wound on the posterior left calf. He tells me that is been there for about a year. He is not a diabetic he does smoke half a pack per day. He was seen in the ER on 11/20/16 felt to have cellulitis around the wound and was given clindamycin. An x-ray did not show osteomyelitis. The patient initially tells me that he has a milk allergy that sets off a pruritic itching rash on his lower legs which she scratches incessantly and he thinks that's what may have set up the wound. He has been using various topical antibiotics and ointments without any effect. He works in a [employer]ing [employer] and is on his feet all day. He does not have a prior history of wounds however he does have the rash on both lower  legs the right arm and the ventral aspect of his left arm. These are excoriations and clearly have had scratching however there are of macular looking areas on both legs including a substantial larger area on the right leg. This does not have an underlying open area. There is no blistering. The patient tells me that 2 years ago in [place] in response to the rash on his legs he saw a dermatologist who told him he had a condition which may be pyoderma gangrenosum although I may be putting words  into his mouth. He seemed to recognize this. 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 He is not in any treatment for this. He's had no recent travel 12/11/16; the patient arrives today with his wound and roughly the same condition we've been using silver alginate this is a deep punched out wound with some surrounding erythema but no tenderness. Biopsy I did did not show confirmed pyoderma gangrenosum suggested nonspecific inflammation and vasculitis but does not provide an actual description of what was seen by the pathologist. I'm really not able to understand this We have also received information from the patient's dermatologist in [place] notes from April 2016. This was a doctor Agarwal-antal. 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 He was prescribed topical steroid high potency under occlusion which helped but at this point the patient did not have a deep punched out wound. 12/18/16; the patient's wound is larger in terms of surface area however this surface looks better and there is less depth. The surrounding erythema also is better. The patient states that the wrap we put on came off 2 days ago when he has been using his compression stockings. He we are in the process of getting a dermatology consult. 12/26/16 on evaluation today patient's left lower extremity wound shows evidence of infection with surrounding erythema noted. He has been tolerating the dressing changes but states that he has noted more discomfort. There is a larger area of erythema surrounding the wound. No fevers, chills, nausea, or vomiting noted at this time. With that being said the wound still does have slough covering the surface. He is not allergic to any medication that he is aware of at this point. In regard to his right lower extremity he had several regions that are erythematous and pruritic he wonders if there's anything we can do to help that. 01/02/17 I  reviewed patient's wound culture which was obtained his visit last week. He was placed on doxycycline at that point. Unfortunately that does not appear to be an antibiotic that would likely help with the situation however the pseudomonas noted on culture is sensitive to Cipro. Also unfortunately patient's wound seems to have a large compared to last week's evaluation. Not severely so but there are definitely increased measurements in general. He is continuing to have discomfort as well he writes this to be a seven out of 10. In fact he would prefer me not to perform any debridement today due to the fact that he is having discomfort and considering he has an active infection on the little reluctant to do so anyway. No fevers, chills, nausea, or vomiting noted at this time. 01/08/17; patient seems dermatology on September 5. I suspect dermatology will want the slides from the biopsy I did sent to their pathologist. I'm not sure if there is a way we can expedite that. In any case the culture I did before I left on vacation 3  weeks ago showed Pseudomonas he was given 10 days of Cipro and per her description of her intake nurses is actually somewhat better this week although the wound is quite a bit bigger than I remember the last time I saw this. He still has 3 more days of Cipro 01/21/17; dermatology appointment tomorrow. He has completed the ciprofloxacin for Pseudomonas. Surface of the wound looks better however he is had some deterioration in the lesions on his right leg. Meantime the left lateral leg wound we will continue with sample 01/29/17; patient had his dermatology appointment but I can't yet see that note. He is completed his antibiotics. The wound is more superficial but considerably larger in circumferential area than when he came in. This is in his left lateral calf. He also has swollen erythematous areas with superficial wounds on the right leg and small papular areas on both arms. There  apparently areas in her his upper thighs and buttocks I did not look at those. Dermatology biopsied the right leg. Hopefully will have their input next week. 02/05/17; patient went back to see his dermatologist who told him that he had a "scratching problem" as well as staph. He is now on a 30 day course of doxycycline and I believe she gave him triamcinolone cream to the right leg areas to help with the itching [not exactly sure but probably triamcinolone]. She apparently looked at the left lateral leg wound although this was not rebiopsied and I think felt to be ultimately part of the same pathogenesis. He is using sample border foam and changing nevus himself. He now has a new open area on the right posterior leg which was his biopsy site I don't have any of the dermatology notes 02/12/17; we put the patient in compression last week with SANTYL to the wound on the left leg and the biopsy. Edema is much better and the depth of the wound is now at level of skin. Area is still the same Biopsy site on the right lateral leg we've also been using santyl with a border foam dressing and he is changing this himself. 02/19/17; Using silver alginate started last week to both the substantial left leg wound and the biopsy site on the right wound. He is tolerating compression well. Has a an appointment with his primary M.D. tomorrow wondering about diuretics although I'm wondering if the edema problem is actually lymphedema CLENT, DAMORE ([id number]) 02/26/17; the patient has been to see his primary doctor Dr. Jerrel Ivory at [facility] our primary care. She started him on Lasix 20 mg and this seems to have helped with the edema. However we are not making substantial change with the left lateral calf wound and inflammation. The biopsy site on the right leg also looks stable but not really all that different. 03/12/17; the patient has been to see vein and vascular Dr. Lucky Cowboy. He has had venous reflux studies I have not  reviewed these. I did get a call from his dermatology office. 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 They therefore recommended clobetasol and tetracycline i.e. 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 03/26/17; apparently the patient just had reflux studies not an appointment with Dr. dew. She arrives in clinic today  having applied clobetasol for 2-3 weeks. He notes over the last 2-3 days excessive drainage having to change the dressing 3-4 times a day and also expanding erythema. 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 Lillia Carmel at [facility] dermatology locally who kindly saw him at my request. 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 My concern was the degree of erythema medially extending from his left leg wound which was either inflammation from pyoderma or cellulitis. I put him on Augmentin however culture of the wound showed Pseudomonas which is quinolone sensitive. I really don't believe he has cellulitis however in view of everything I will continue and give him a course of Cipro. He is also on doxycycline as an immune modulator for the pyoderma. In addition to his original wound on the left lateral leg with surrounding erythema he has a wound on the right posterior calf which was an original biopsy site done by dermatology. This was felt to represent pathergy from pyoderma gangrenosum 04/16/17;  pyoderma gangrenosum. Saw Dr. Lillia Carmel yesterday. He has been using topical antibiotics to both wound areas his original wound on the left and the biopsies/pathergy area on the right. There is definitely some improvement in the inflammation around the wound on the right although the patient states he has increasing sensitivity of the wounds. He is on prednisone 60 and doxycycline 1 as prescribed by Dr. Lillia Carmel. He is covering the topical antibiotic with gauze and putting this in his own compression stocks and changing this daily. He states that Dr. Lottie Rater did a culture of the left leg wound yesterday 05/07/17; pyoderma gangrenosum. The patient saw Dr. Lillia Carmel yesterday and has a follow-up with her in one month. He is still using topical antibiotics to both wounds although he can't recall exactly what type. He is still on prednisone 60 mg. Dr. Lillia Carmel stated that the doxycycline could stop if we were in agreement. 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 The right medial leg was induced by biopsy/pathergy. The area on the right is essentially healed. 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 The right medial leg has closed and remains closed. He is still on prednisone 60. He tells me he missed his last dermatology appointment with Dr. Lillia Carmel but will make another appointment. He reports that her blood sugar at a recent screen in [place] was high 200's. He was 180 today. 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 I think he is going to require still much longer prednisone perhaps another 3 months before attempting to taper. In the meantime his wound is a lot better. Smaller. He is cleaning this off daily and applying topical antibiotics. When he was last in the clinic I thought  about changing to [facility] and actually put in a couple of calls to dermatology although probably not during their business hours. In any case the wound looks better smaller I don't think there is any need to change what he is doing 08/06/17-he is here in follow up evaluation for pyoderma left leg  ulcer. He continues on oral prednisone. He has been using triple antibiotic ointment. There is surface debris and we will transition to [facility] and have him return in 2 weeks. He has lost 30 pounds since his last appointment with lifestyle modification. He may benefit from topical steroid cream for treatment this can be considered at a later date. 08/22/17 on evaluation today patient appears to actually be doing rather well in regard to his left lateral lower extremity ulcer. He has actually been managed by Dr. Dellia Nims most recently. Patient is currently on oral steroids at this time. This seems to have been of benefit for him. Nonetheless his last visit was actually with Leah on 08/06/17. Currently he is not utilizing any topical steroid creams although this could be of benefit as well. No fevers, chills, nausea, or vomiting noted at this time. 09/05/17 on evaluation today patient appears to be doing better in regard to his left lateral lower extremity ulcer. He has been tolerating the dressing changes without complication. He is using Santyl with good effect. Overall I'm very pleased with how things are standing at this point. Patient likewise is happy that this is doing better. 09/19/17 on evaluation today patient actually appears to be doing rather well in regard to his left lateral lower extremity ulcer. 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 He's not having any significant pain. 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 He  has been tolerating the Santyl without complication and in general I feel like he's making good progress. 10/17/17 on evaluation today patient appears to be doing very well in regard to his left lateral lower surety ulcer. He has been tolerating the dressing changes without complication. There does not appear to be any evidence of infection he's alternating the Santyl and the triple antibiotic ointment every other day this seems to be doing well for him. 11/03/17 on evaluation today patient appears to be doing very well in regard to his left lateral lower extremity ulcer. He is been tolerating the dressing changes without complication which is good news. Fortunately there does not appear to be any evidence of infection which is also great news. Overall is doing excellent they are starting to taper down on the prednisone is down to 40 mg at this point it also started topical clobetasol for him. 11/17/17 on evaluation today patient appears to be doing well in regard to his left lateral lower surety ulcer. He's been tolerating the dressing changes without complication. He does note that he is having no pain, no excessive drainage or discharge, and overall he feels like things are going about how he would expect and hope they would. Overall he seems to have no evidence of infection at this time in my opinion which is good news. 12/04/17-He is seen in follow-up evaluation for right lateral lower extremity ulcer. He has been applying topical steroid cream. Today's measurement show slight increase in size. Over the next 2 weeks we will transition to every other day Santyl and steroid cream. He has been encouraged to monitor for changes and notify clinic with any concerns 12/15/17 on evaluation today patient's left lateral motion the ulcer and fortunately is doing worse again at this point. This just since last week to this week has close to doubled in size according to the patient. I did  not seeing last week's I  do not have a visual to compare this to in our system was also down so we do not have all the charts and at this point. Nonetheless it does have me somewhat concerned in regard to the fact that again he was worried enough about it he has contact the dermatology that placed them back on the full strength, 50 mg a day of the prednisone that he was taken previous. He continues to alternate using clobetasol along with Santyl at this point. He is obviously somewhat frustrated. [name], [name] ([id number]) 12/22/17 on evaluation today patient appears to be doing a little worse compared to last evaluation. Unfortunately the wound is a little deeper and slightly larger than the last week's evaluation. With that being said he has made some progress in regard to the irritation surrounding at this time unfortunately despite that progress that's been made he still has a significant issue going on here. 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 For that reason I discussed with him today the possibility of perform a wound culture to ensure there's nothing overtly infected. 01/06/18 on evaluation today patient's wound is larger and deeper than previously evaluated. With that being said it did appear that his wound was infected after my last evaluation with him. Subsequently I did end up prescribing a prescription for Bactrim DS which she has been taking and having no complication with. Fortunately there does not appear to be any evidence of infection at this point in time as far as anything spreading, no want to touch, and overall I feel like things are showing signs of improvement. 01/13/18 on evaluation today patient appears to be even a little larger and deeper than last time. There still muscle exposed in the base of the wound. Nonetheless he does appear to be less  erythematous I do believe inflammation is calming down also believe the infection looks like it's probably resolved at this time based on what I'm seeing. No fevers, chills, nausea, or vomiting noted at this time. 01/30/18 on evaluation today patient actually appears to visually look better for the most part. Unfortunately those visually this looks better he does seem to potentially have what may be an abscess in the muscle that has been noted in the central portion of the wound. This is the first time that I have noted what appears to be fluctuance in the central portion of the muscle. With that being said I'm somewhat more concerned about the fact that this might indicate an abscess formation at this location. I do believe that an ultrasound would be appropriate. This is likely something we need to try to do as soon as possible. He has been switch to mupirocin ointment and he is no longer using the steroid ointment as prescribed by dermatology he sees them again next week he's been decreased from 60 to 40 mg of prednisone. 03/09/18 on evaluation today patient actually appears to be doing a little better compared to last time I saw him. There's not as much erythema surrounding the wound itself. He I did review his most recent infectious disease note which was dated 02/24/18. He saw Dr. Michel Bickers in [place]. 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 Patient is now off of antibiotics  and they are continually observing this. There seems to be no change in the past two weeks in my pinion based on what the patient says and what I see today compared to what Dr. Megan Salon likely saw two weeks ago. No fevers, chills, nausea, or vomiting noted at this time. 03/23/18 on evaluation today patient's wound actually appears to be showing signs of improvement which is good news. He is currently still on the Dapsone. He is  also working on tapering the prednisone to get off of this and Dr. Lottie Rater is working with him in this regard. Nonetheless overall I feel like the wound is doing well it does appear based on the infectious disease note that I reviewed from Dr. 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 I did also review the results of his ultrasound of left lower extremity which revealed there was a dentist tissue in the base of the wound without an abscess noted. 04/06/18 on evaluation today the patient's left lateral lower extremity ulcer actually appears to be doing fairly well which is excellent news. There does not appear to be any evidence of infection at this time which is also great news. Overall he still does have a significantly large ulceration although little by little he seems to be making progress. He is down to 10 mg a day of the prednisone. 04/20/18 on evaluation today patient actually appears to be doing excellent at this time in regard to his left lower extremity ulcer. He's making signs of good progress unfortunately this is taking much longer than we would really like to see but nonetheless he is making progress. Fortunately there does not appear to be any evidence of infection at this time. No fevers, chills, nausea, or vomiting noted at this time. The patient has not been using the Santyl due to the cost he hadn't got in this field yet. He's mainly been using the antibiotic ointment topically. Subsequently he also tells me that he really has not been scrubbing in the shower I think this would be helpful again as I told him it doesn't have to be anything too aggressive to even make it believe just enough to keep it free of some of the loose slough and biofilm on the wound surface. 05/11/18 on evaluation today patient's wound appears to be making slow but sure progress in regard to the  left lateral lower extremity ulcer. He is been tolerating the dressing changes without complication. Fortunately there does not appear to be any evidence of infection at this time. He is still just using triple antibiotic ointment along with clobetasol occasionally over the area. He never got the Santyl and really does not seem to intend to in my pinion. 06/01/18 on evaluation today patient appears to be doing a little better in regard to his left lateral lower extremity ulcer. He states that overall he does not feel like he is doing as well with the Dapsone as he did with the prednisone. Nonetheless he sees his dermatologist later today and is gonna talk to them about the possibility of going back on the prednisone. Overall again I believe that the wound would be better if you would utilize Santyl but he really does not seem to be interested in going back to the [place] at this point. He has been using triple antibiotic ointment. 06/15/18 on evaluation today patient's wound actually appears to  be doing about the same at this point. Fortunately there is no signs of infection at this time. He has made slight improvements although he continues to not really want to clean the wound bed at this point. He states that he just doesn't mess with it he doesn't want to cause any problems with everything else he has going on. He has been on medication, antibiotics as prescribed by his dermatologist, for a staff infection of his lower extremities which is really drying out now and looking much better he tells me. Fortunately there is no sign of overall infection. 06/29/18 on evaluation today patient appears to be doing well in regard to his left lateral lower surety ulcer all things considering. Fortunately his staff infection seems to be greatly improved compared to previous. He has no signs of infection and this is drying up quite nicely. He is still the doxycycline for this is no longer on cental, Dapsone, or any  of the other medications. His dermatologist has recommended possibility of an infusion but right now he does not want to proceed with that. 07/13/18 on evaluation today patient appears to be doing about the same in regard to his left lateral lower surety ulcer. Fortunately there's no signs of infection at this time which is great news. Unfortunately he still builds up a significant amount of Slough/biofilm of the surface of the wound he still is not really cleaning this as he should be appropriately. Again I'm able to easily with saline and gauze remove the majority of this on the surface which if you would do this at home would likely be a dramatic improvement for him as far as getting the area to improve. Nonetheless overall I still feel like he is making progress is just very slow. I think Santyl will be of benefit for him as well. Still he has not gotten this as of this point. 07/27/18 on evaluation today patient actually appears to be doing little worse in regards of the erythema around the periwound region of the wound he also tells me that he's been having more drainage currently compared to what he was experiencing last time I saw him. He states not quite as bad as what he had because this was infected previously but nonetheless is still appears to be doing poorly. Fortunately there is no evidence of systemic infection at this point. The patient tells me that he is not going to be able to afford the Santyl. He is still waiting to hear about the infusion therapy with his dermatologist. Apparently she wants an updated colonoscopy first. [name], [name] ([id number]) 08/10/18 on evaluation today patient appears to be doing better in regard to his left lateral lower extremity ulcer. Fortunately he is showing signs of improvement in this regard he's actually been approved for Remicade infusion's as well although this has not been scheduled as of yet. Fortunately there's no signs of active infection at  this time in regard to the wound although he is having some issues with infection of the right lower extremity is been seen as dermatologist for this. Fortunately they are definitely still working with him trying to keep things under control. 09/07/18 on evaluation today patient is actually doing rather well in regard to his left lateral lower extremity ulcer. He notes these actually having some hair grow back on his extremity which is something he has not seen in years. He also tells me that the pain is really not giving them any trouble at this time which  is also good news overall she is very pleased with the progress he's using a combination of the mupirocin along with the probate is all mixed. 09/21/18 on evaluation today patient actually appears to be doing fairly well all things considered in regard to his looks from the ulcer. He's been tolerating the dressing changes without complication. Fortunately there's no signs of active infection at this time which is good news he is still on all antibiotics or prevention of the staff infection. He has been on prednisone for time although he states it is gonna contact his dermatologist and see if she put them on a short course due to some irritation that he has going on currently. Fortunately there's no evidence of any overall worsening this is going very slow I think cental would be something that would be helpful for him although he states that $50 for tube is quite expensive. He therefore is not willing to get that at this point. 10/06/18 on evaluation today patient actually appears to be doing decently well in regard to his left lateral leg ulcer. He's been tolerating the dressing changes without complication. Fortunately there's no signs of active infection at this time. Overall I'm actually rather pleased with the progress he's making although it's slow he doesn't show any signs of infection and he does seem to be making some improvement. I do  believe that he may need a switch up and dressings to try to help this to heal more appropriately and quickly. 10/19/18 on evaluation today patient actually appears to be doing better in regard to his left lateral lower extremity ulcer. This is shown signs of having much less Slough buildup at this point due to the fact he has been using the [employer]. Obviously this is very good news. The overall size of the wound is not dramatically smaller but again the appearance is. 11/02/18 on evaluation today patient actually appears to be doing quite well in regard to his lower [place] ulcer. A lot of the skin around the ulcer is actually somewhat irritating at this point this seems to be more due to the dressing causing irritation from the adhesive that anything else. Fortunately there is no signs of active infection at this time. 11/24/18 on evaluation today patient appears to be doing a little worse in regard to his overall appearance of his lower extremity ulcer. There's more erythema and warmth around the wound unfortunately. He is currently on doxycycline which he has been on for some time. With that being said I'm not sure that seems to be helping with what appears to possibly be an acute cellulitis with regard to his left lower extremity ulcer. No fevers, chills, nausea, or vomiting noted at this time. 12/08/18 on evaluation today patient's wounds actually appears to be doing significantly better compared to his last evaluation. He has been using Santyl along with alternating tripling about appointment as well as the steroid cream seems to be doing quite well and the wound is showing signs of improvement which is excellent news. Fortunately there's no evidence of infection and in fact his culture came back negative with only normal skin flora noted. 12/21/2018 upon evaluation today patient actually appears to be doing excellent with regard to his ulcer. This is actually the best that I have seen it since have  been helping to take care of him. It is both smaller as well as less slough noted on the surface of the wound and seems to be showing signs of good improvement  with new skin growing from the edges. He has been using just the triamcinolone he does wonder if he can get a refill of that ointment today. 01/04/2019 upon evaluation today patient actually appears to be doing well with regard to his left lateral lower extremity ulcer. With that being said it does not appear to be that he is doing quite as well as last time as far as progression is concerned. There does not appear to be any signs of infection or significant irritation which is good news. With that being said I do believe that he may benefit from switching to a collagen based dressing based on how clean The wound appears. 01/18/2019 on evaluation today patient actually appears to be doing well with regard to his wound on the left lower extremity. He is not made a lot of progress compared to where we were previous but nonetheless does seem to be doing okay at this time which is good news. There is no signs of active infection which is also good news. My only concern currently is I do wish we can get him into utilizing the collagen dressing his insurance would not pay for the supplies that we ordered although it appears that he may be able to order this through his supply company that he typically utilizes. This is [employer]. Nonetheless he did try to order it during the office visit today and it appears this did go through. We will see if he can get that it is a different brand but nonetheless he has collagen and I do think will be beneficial. 02/01/2019 on evaluation today patient actually appears to be doing a little worse today in regard to the overall size of his wounds. Fortunately there is no signs of active infection at this time. That is visually. Nonetheless when this is happened before it was due to infection. For that reason were somewhat  concerned about that this time as well. 02/08/2019 on evaluation today patient unfortunately appears to be doing slightly worse with regard to his wound upon evaluation today. Is measuring a little deeper and a little larger unfortunately. I am not really sure exactly what is causing this to enlarge he actually did see his dermatologist she is going to see about initiating Humira for him. Subsequently she also did do steroid injections into the wound itself in the periphery. Nonetheless still nonetheless he seems to be getting a little bit larger he is gone back to just using the steroid cream topically which I think is appropriate. I would say hold off on the collagen for the time being is definitely a good thing to do. Based on the culture results which we finally did get the final result back regarding it shows staph as the bacteria noted again that can be a normal skin bacteria based on the fact however he is having increased drainage and worsening of the wound measurement wise I would go ahead and place him on an antibiotic today I do believe for this. 02/15/2019 on evaluation today patient actually appears to be doing somewhat better in regard to his ulcer. There is no signs of worsening at this time I did review his culture results which showed evidence of Staphylococcus aureus but not MRSA. Again this could just be more related to the normal skin bacteria although he states the drainage has slowed down quite a bit he may have had a mild infection not just colonization. And was much smaller and then since around10/04/2019 on evaluation today patient  appears to be doing unfortunately worse as far as the size of the wound. I really feel like that this is steadily getting larger again it had been doing excellent right at the beginning of September we have seen a steady increase in the area of the wound it is almost 2-1/2 times the size it was on September 1. Obviously this is a bad trend this is  not wanting to see. For that reason we went back to using just the topical triamcinolone cream which does seem to help with inflammation. I checked him for bacteria by way of culture and nothing showed positive there. I am considering giving him a short course of a tapering steroid Heraclio [name], [name] ([id number]) today to see if that is can be beneficial for him. The patient is in agreement with giving that a try. 03/08/2019 on evaluation today patient appears to be doing very well in comparison to last evaluation with regard to his lower extremity ulcer. This is showing signs of less inflammation and actually measuring slightly smaller compared to last time every other week over the past month and a half he has been measuring larger larger larger. Nonetheless I do believe that the issue has been inflammation the prednisone does seem to have been beneficial for him which is good news. No fevers, chills, nausea, vomiting, or diarrhea. 03/22/2019 on evaluation today patient appears to be doing about the same with regard to his leg ulcer. He has been tolerating the dressing changes without complication. With that being said the wound seems to be mostly arrested at its current size but really is not making any progress except for when we prescribed the prednisone. He did show some signs of dropping as far as the overall size of the wound during that interval week. Nonetheless this is something he is not on long-term at this point and unfortunately I think he is getting need either this or else the Humira which his dermatologist has discussed try to get approval for. With that being said he will be seeing his dermatologist on the 11th of this month that is November. 04/19/2019 on evaluation today patient appears to be doing really about the same the wound is measuring slightly larger compared to last time I saw him. He has not been into the office since November 2 due to the fact that he unfortunately  had Covid as that his entire family. He tells me that it was rough but they did pull-through and he seems to be doing much better. Fortunately there is no signs of active infection at this time. No fevers, chills, nausea, vomiting, or diarrhea. 05/10/2019 on evaluation today patient unfortunately appears to be doing significantly worse as compared to last time I saw him. He does tell me that he has had his first dose of Humira and actually is scheduled to get the next one in the upcoming week. With that being said he tells me also that in the past several days he has been having a lot of issues with green drainage she showed me a picture this is more blue-green in color. He is also been having issues with increased sloughy buildup and the wound does appear to be larger today. Obviously this is not the direction that we want everything to take based on the starting of his Humira. Nonetheless I think this is definitely a result of likely infection and to be honest I think this is probably Pseudomonas causing the infection based on what I am seeing.  05/24/2019 on evaluation today patient unfortunately appears to be doing significantly worse compared to his prior evaluation with me 2 weeks ago. I did review his culture results which showed that he does have Staph aureus as well as Pseudomonas noted on the culture. Nonetheless the Levaquin that I prescribed for him does not appear to have been appropriate and in fact he tells me he is no longer experiencing the green drainage and discharge that he had at the last visit. Fortunately there is no signs of active infection at this time which is good news although the wound has significantly worsened it in fact is much deeper than it was previous. We have been utilizing up to this point triamcinolone ointment as the prescription topical of choice but at this time I really feel like that the wound is getting need to be packed in order to appropriately manage this  due to the deeper nature of the wound. Therefore something along the lines of an alginate dressing may be more appropriate. 05/31/2019 upon inspection today patient's wound actually showed signs of doing poorly at this point. Unfortunately he just does not seem to be making any good progress despite what we have tried. He actually did go ahead and pick up the Cipro and start taking that as he was noticing more green drainage he had previously completed the Levaquin that I prescribed for him as well. Nonetheless he missed his appointment for the seventh last week on Wednesday with the wound care center and [facility] where his dermatologist referred him. Obviously I do think a second opinion would be helpful at this point especially in light of the fact that the patient seems to be doing so poorly despite the fact that we have tried everything that I really know how at this point. The only thing that ever seems to have helped him in the past is when he was on high doses of continual steroids that did seem to make a difference for him. 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 06/07/2019 upon evaluation today patient unfortunately appears to be doing worse yet again with regard to his wound. In fact I am starting to question whether or not he may have a fluid pocket in the muscle at this point based on the bulging and the soft appearance to the central portion of the muscle area. There is not anything draining from the muscle itself at this time which is good news but nonetheless the wound is expanding. I am not really seeing any results of the Humira as far as overall wound progression based on what I am seeing at this point. The patient has been referred for second opinion with regard to his wound to the [facility] wound care center by his  dermatologist which I definitely am not in opposition to. Unfortunately we tried multiple dressings in the past including collagen, alginate, and at one point even Hydrofera Blue. With that being said he is never really used it for any significant amount of time due to the fact that he often complains of pain associated with these dressings and then will go back to either using the Santyl which she has done intermittently or more frequently the triamcinolone. He is also using his own compression stockings. We have wrapped him in the past but  again that was something else that he really was not a big fan of. 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 He seemed to do much better when he was on the steroids although honestly I understand that the steroids are not necessarily the best medication to be on long-term obviously 06/14/2019 on evaluation today patient appears to be doing actually a little bit better with regard to the overall appearance with his leg. Unfortunately he does continue to have issues with what appears to be some fluid underneath the muscle although he did see the wound specialty center at [facility] last week their main goals were to see about infusion therapy in place of the Humira as they feel like that is not quite strong enough. They also recommended that we continue with the treatment otherwise as we are they felt like that was appropriate and they are okay with him continuing to follow-up here with [place] in that regard. With that being said they are also sending him to the vein specialist there to see about vein stripping and if that would be of benefit for him.  Subsequently they also did not really address whether or not an ultrasound of the muscle area to see if there is anything that needs to be addressed here would be appropriate or not. For that reason I discussed this with him last week I think we may proceed down that road at this point. 06/21/2019 upon evaluation today patient's wound actually appears to be doing slightly better compared to previous evaluations. I do believe that he has made a difference with regard to the progression here with the use of oral steroids. Again in the past has been the only thing that is really calm things down. He does tell me that from [facility] is gotten a good news from there that there are no further vein stripping that is necessary at this point. I do not have that available for review today although the patient did relay this to me. He also did obtain and have the ultrasound of the wound completed which I did sign off on today. It does appear that there is no fluid collection under the muscle this is likely then just edematous tissue in general. That is also good news. Overall I still believe the inflammation is the main issue here. He did inquire about the possibility of a wound VAC again with the muscle protruding like it is I am not really sure whether the wound VAC is necessarily ideal or not. That is something we will have to consider although I do believe he may need compression wrapping to try to help with edema control which could potentially be of benefit. 06/28/2019 on evaluation today patient appears to be doing slightly better measurement wise although this is not terribly smaller he least seems to be trending towards that direction. With that being said he still seems to have purulent drainage noted in the wound bed at this time. He has been on Levaquin followed by Cipro over the past month. Unfortunately he still seems to have some issues with active infection at this time. I did perform a culture last week  in order to evaluate and see if indeed there was still anything going on. Subsequently the culture did come back Wargo, Larnie ([id number]) showing Pseudomonas which is consistent with the drainage has been having which is blue-green in color. He also has had an odor that again was somewhat consistent with Pseudomonas as well. Long story short it appears that the culture showed an intermediate finding with regard to how well the Cipro will work for the Pseudomonas infection. Subsequently being that he does not seem to be clearing up and at best what we are doing is just keeping this at [place] I think he may need to see infectious disease to discuss IV antibiotic options. 07/05/2019 upon evaluation today patient appears to be doing okay in regard to his leg ulcer. He has been tolerating the dressing changes at this point without complication. Fortunately there is no signs of active infection at this time which is good news. No fevers, chills, nausea, vomiting, or diarrhea. With that being said he does have an appointment with infectious disease tomorrow and his primary care on Wednesday. Again the reason for the infectious disease referral was due to the fact that he did not seem to be fully resolving with the use of oral antibiotics and therefore we were thinking that IV antibiotic therapy may be necessary secondary to the fact that there was an intermediate finding for how effective the Cipro may be. Nonetheless again he has been having a lot of purulent and even green drainage. Fortunately right now that seems to have calmed down over the past week with the reinitiation of the oral antibiotic. Nonetheless we will see what Dr. Megan Salon has to say. 07/12/2019 upon evaluation today patient appears to be doing about the same at this point in regard to his left lower extremity ulcer. Fortunately there is no signs of active infection at this time which is good news I do believe the Levaquin has been beneficial I  did review Dr. Hale Bogus note and to be honest I agree that the patient's leg does appear to be doing better currently. What we found in the past as he does not seem to really completely resolve he will stop the antibiotic and then subsequently things will revert back to having issues with blue-green drainage, increased pain, and overall worsening in general. Obviously that is the reason I sent him back to infectious disease. 07/19/2019 upon evaluation today patient appears to be doing roughly the same in size there is really no dramatic improvement. He has started back on the Levaquin at this point and though he seems to be doing okay he did still have a lot of blue/green drainage noted on evaluation today unfortunately. I think that this is still indicative more likely of a Pseudomonas infection as previously noted and again he does see Dr. Megan Salon in just a couple of days. I do not know that were really able to effectively clear this with just oral antibiotics alone based on what I am seeing currently. Nonetheless we are still continue to try to manage as best we can with regard to the patient and his wound. I do think the wrap was helpful in decreasing the edema which is excellent news. No fevers, chills, nausea, vomiting, or diarrhea. 07/26/2019 upon evaluation today patient appears to be doing slightly better with regard to the overall appearance of the muscle there is no dark discoloration centrally. Fortunately there is no signs of active infection at this time. No fevers, chills, nausea, vomiting, or  diarrhea. Patient's wound bed currently the patient did have an appointment with Dr. Megan Salon at infectious disease last week. With that being said Dr. Megan Salon the patient states was still somewhat hesitant about put him on any IV antibiotics he wanted [place] to repeat cultures today and then see where things go going forward. He does look like Dr. Megan Salon because of some improvement the patient did  have with the Levaquin wanted [place] to see about repeating cultures. If it indeed grows the Pseudomonas again then he recommended a possibility of considering a PICC line placement and IV antibiotic therapy. He plans to see the patient back in 1 to 2 weeks. 08/02/2019 upon evaluation today patient appears to be doing poorly with regard to his left lower extremity. We did get the results of his culture back it shows that he is still showing evidence of Pseudomonas which is consistent with the purulent/blue-green drainage that he has currently. Subsequently the culture also shows that he now is showing resistance to the oral fluoroquinolones which is unfortunate as that was really the only thing to treat the infection prior. I do believe that he is looking like this is going require IV antibiotic therapy to get this under control. Fortunately there is no signs of systemic infection at this time which is good news. The patient does see Dr. Megan Salon tomorrow. 08/09/2019 upon evaluation today patient appears to be doing better with regard to his left lower extremity ulcer in regard to the overall appearance. He is currently on IV antibiotic therapy. As ordered by Dr. Megan Salon. Currently the patient is on ceftazidime which she is going to take for the next 2 weeks and then follow-up for 4 to 5-week appointment with Dr. Megan Salon. The patient started this this past Friday symptoms have not for a total of 3 days currently in full. 08/16/2019 upon evaluation today patient's wound actually does show muscle in the base of the wound but in general does appear to be much better as far as the overall evidence of infection is concerned. In fact I feel like this is for the most part cleared up he still on the IV antibiotics he has not completed the full course yet but I think he is doing much better which is excellent news. 08/23/2019 upon evaluation today patient appears to be doing about the same with regard to his wound at  this point. He tells me that he still has pain unfortunately. Fortunately there is no evidence of systemic infection at this time which is great news. There is significant muscle protrusion. 09/13/19 upon evaluation today patient appears to be doing about the same in regard to his leg unfortunately. He still has a lot of drainage coming from the ulceration there is still muscle exposed. With that being said the patient's last wound culture still showed an intermediate finding with regard to the Pseudomonas he still having the bluish/green drainage as well. Overall I do not know that the wound has completely cleared of infection at this point. Fortunately there is no signs of active infection systemically at this point which is good news. 09/20/2019 upon evaluation today patient's wound actually appears to be doing about the same based on what I am seeing currently. I do not see any signs of systemic infection he still does have evidence of some local infection and drainage. He did see Dr. Megan Salon last week and Dr. Megan Salon states that he probably does need a different IV antibiotic although he does not want to put  him on this until the patient begins the Remicade infusion which is actually scheduled for about 10 days out from today on 13 May. Following that time Dr. Megan Salon is good to see him back and then will evaluate the feasibility of starting him on the IV antibiotic therapy once again at that point. I do not disagree with this plan I do believe as Dr. Megan Salon stated in his note that I reviewed today that the patient's issue is multifactorial with the pyoderma being 1 aspect of this that were hoping the Remicade will be helpful for her. In the meantime I think the gentamicin is, helping to keep things under decent okay control in regard to the ulcer. 09/27/2019 upon evaluation today patient appears to be doing about the same with regard to his wound still there is a lot of muscle exposure though  he does have some hyper granulation tissue noted around the edge and actually some granulation tissue starting to form over the muscle which is actually good news. Fortunately there is no evidence of active infection which is also good news. His pain is less at this point. 5/21; this is a patient I have not seen in a long time. 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 He has a large wound on the left lateral leg with protruding muscle. He comes in the clinic today showing the same area on his left medial ankle. He says there is been a spot there for some time although we have not previously defined this. Today he has a clearly defined area with slight amount of skin breakdown surrounded by raised areas with a purplish hue in color. This is not painful he says it is irritated. 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 He still has muscle protruding from the lateral portion of his left leg but fortunately the new area that they were concerned about at his last visit does not appear to have opened at this point. He is currently on Remicade infusions and seems to be doing better in my opinion in fact the wound itself seems to be overall much better. The purplish discoloration that he did have seems to have resolved and I think that is a good sign that hopefully the Remicade is doing its job. He does [name], [name] ([id number]) have some biofilm noted over the surface of the wound. 11/01/2019 on evaluation today patient's wound actually appears to be doing excellent at this time. Fortunately there is no evidence of active infection and overall I feel like he is making great progress. The Remicade seems to be due excellent job in my opinion. 11/08/19 evaluation today vision actually appears to be doing quite well with regard to his weight ulcer. He's been  tolerating dressing changes without complication. Fortunately there is no evidence of infection. No fevers, chills, nausea, or vomiting noted at this time. Overall states that is having more itching than pain which is actually a good sign in my opinion. 12/13/2019 upon evaluation today patient appears to be doing well today with regard to his wound. He has been tolerating the dressing changes without complication. Fortunately there is no sign of active infection at this time. No fevers, chills, nausea, vomiting, or diarrhea. Overall I feel like the infusion therapy has been very beneficial for him. 01/06/2020 on evaluation today patient appears to be doing well with regard to his wound. This is measuring  smaller and actually looks to be doing better. Fortunately there is no signs of active infection at this point. No fevers, chills, nausea, vomiting, or diarrhea. With that being said he does still have the blue-green drainage but this does not seem to be causing any significant issues currently. He has been using the gentamicin that does seem to be keeping things under decent control at this point. He goes later this morning for his next infusion therapy for the pyoderma which seems to also be very beneficial. 02/07/2020 on evaluation today patient appears to be doing about the same in regard to his wounds currently. Fortunately there is no signs of active infection systemically he does still have evidence of local infection still using gentamicin. He also is showing some signs of improvement albeit slowly I do feel like we are making some progress here. 02/21/2020 upon evaluation today patient appears to be making some signs of improvement the wound is measuring a little bit smaller which is great news and overall I am very pleased with where he stands currently. He is going to be having infusion therapy treatment on the 15th of this month. Fortunately there is no signs of active infection at this  time. 03/13/2020 I do believe patient's wound is actually showing some signs of improvement here which is great news. He has continue with the infusion therapy through rheumatology/dermatology at [facility]. That does seem to be beneficial. I still think he gets as much benefit from this as he did from the prednisone initially but nonetheless obviously this is less harsh on his body that the prednisone as far as they are concerned. 03/31/2020 on evaluation today patient's wound actually showing signs of some pretty good improvement in regard to the overall appearance of the wound bed. There is still muscle exposed though he does have some epithelial growth around the edges of the wound. Fortunately there is no signs of active infection at this time. No fevers, chills, nausea, vomiting, or diarrhea. 04/24/2020 upon evaluation today patient appears to be doing about the same in regard to his leg ulcer. He has been tolerating the dressing changes without complication. Fortunately there is no signs of active infection at this time. No fevers, chills, nausea, vomiting, or diarrhea. With that being said he still has a lot of irritation from the bandaging around the edges of the wound. We did discuss today the possibility of a referral to plastic surgery. 05/22/2020 on evaluation today patient appears to be doing well with regard to his wounds all things considered. He has not been able to get the Chantix apparently there is a recall nurse that I was unaware of put out by [employer] involuntarily. Nonetheless for now I am and I have to do some research into what may be the best option for him to help with quitting in regard to smoking and we discussed that today. 06/26/2020 upon evaluation today patient appears to be doing well with regard to his wound from the standpoint of infection I do not see any signs of infection at this point. With that being said unfortunately he is still continuing to have issues with muscle  exposure and again he is not having a whole lot of new skin growth unfortunately. There does not appear to be any signs of active infection at this time. No fevers, chills, nausea, vomiting, or diarrhea. 07/10/2020 upon evaluation today patient appears to be doing a little bit more poorly currently compared to where he was previous. I am concerned  currently about an active infection that may be getting worse especially in light of the increased size and tenderness of the wound bed. No fevers, chills, nausea, vomiting, or diarrhea. Objective Constitutional Well-nourished and well-hydrated in no acute distress. Vitals Time Taken: 8:13 AM, Height: 71 in, Weight: 338 lbs, BMI: 47.1, Temperature: 98.4 F, Pulse: 89 bpm, Respiratory Rate: 18 breaths/min, Blood Pressure: 125/84 mmHg. Respiratory normal breathing without difficulty. Psychiatric this patient is able to make decisions and demonstrates good insight into disease process. Alert and Oriented x 3. pleasant and cooperative. General Notes: Upon inspection patient's wound actually appears to have expanded a little bit and there does appear to be some breakdown here. Copland, Keilon ([id number]) It is also extremely tender to touch which is unfortunate. With that being said I do not see any signs of active infection at this time which is good news. No fevers, chills, nausea, vomiting, or diarrhea. Integumentary (Hair, Skin) Wound #1 status is Open. Original cause of wound was Gradually Appeared. The date acquired was: 11/18/2015. The wound has been in treatment 187 weeks. The wound is located on the Left,Lateral Lower Leg. The wound measures 6.5cm length x 7cm width x 0.7cm depth; 35.736cm^2 area and 25.015cm^3 volume. There is muscle and Fat Layer (Subcutaneous Tissue) exposed. There is no tunneling or undermining noted. There is a large amount of serosanguineous drainage noted. The wound margin is epibole. There is medium (34-66%) red granulation  within the wound bed. There is a medium (34-66%) amount of necrotic tissue within the wound bed including Adherent Slough. 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 WOUND #1: - Lower Leg Wound Laterality: Left, Lateral Cleanser: Soap and Water 1 x Per Day/30 Days Discharge Instructions: Gently cleanse wound with antibacterial soap, rinse and pat dry prior to dressing wounds Primary Dressing: Algicell Calcium Alginate Dressing 4x4 (in/in) 1 x Per Day/30 Days Discharge Instructions: apply to wound bed Primary Dressing: gentamicin ointment (Generic) 1 x Per Day/30 Days Discharge Instructions: apply to wound bed Secondary Dressing: Bordered Gauze Sterile-HBD 4x4 (in/in) 1 x Per Day/30 Days Discharge Instructions: Cover wound with Bordered Guaze Sterile as directed 1. I am going to recommend currently that we have the patient go ahead and continue with the wound care measures as before with regard to the gentamicin and triamcinolone and a thin film applied to the wound bed. 2. Also can recommend at this time that we go ahead and initiate a continuation of treatment with a cover dressing although I do think this doing a plain alginate underneath this would be good concerning the amount of drainage she is experiencing. We have tried [employer] but unfortunately insurance would not cover that for him he tells me. 3. I am also can recommend that he continue to use his compression stocking. Obviously I think that this is good as far as trying to keep the drainage under control. We will see patient back for reevaluation in 1 week here in the  clinic. If anything worsens or changes patient will contact our office for additional recommendations. Electronic Signature(s) Signed: 07/10/2020 8:47:27 AM By: Worthy Keeler PA-C Entered By: Worthy Keeler on 07/10/2020 08:47:26 Vieyra, Foy ([id number]) -------------------------------------------------------------------------------- SuperBill Details Patient Name: [name] Date of Service: 07/10/2020  Medical Record Number: [id number] Patient Account Number: [id number] Date of Birth/Sex: 19-Mar-1981 (40 y.o. M) Treating RN: Carlene Coria Primary Care Provider: [name] Other Clinician: Referring Provider: [name] Treating Provider/Extender: [name] Cliche in Treatment: 187 Diagnosis Coding ICD-10 Codes Code Description [phone] Non-pressure chronic ulcer of left calf with fat layer exposed L88 Pyoderma gangrenosum L97.321 Non-pressure chronic ulcer of left ankle limited to breakdown of skin I87.2 Venous insufficiency (chronic) (peripheral) L03.116 Cellulitis of left lower limb F17.208 Nicotine dependence, unspecified, with other nicotine-induced disorders Facility Procedures CPT4 Code: [id number] Description: 99213 - WOUND CARE VISIT-LEV 3 EST PT Modifier: Quantity: 1 Physician Procedures CPT4 Code: 1898421 Description: 03128 - WC PHYS LEVEL 4 - EST PT Modifier: Quantity: 1 CPT4 Code: Description: ICD-10 Diagnosis Description L97.222 Non-pressure chronic ulcer of left calf with fat layer exposed L88 Pyoderma gangrenosum L97.321 Non-pressure chronic ulcer of left ankle limited to breakdown of skin I87.2 Venous insufficiency (chronic)  (peripheral) Modifier: Quantity: Electronic Signature(s) Signed: 07/10/2020 8:47:41 AM By: Worthy Keeler PA-C Entered By: Worthy Keeler on 07/10/2020 08:47:40

## 2020-07-10 NOTE — Progress Notes (Signed)
HAZIM, TREADWAY ([id number]) Visit Report for 07/10/2020 Arrival Information Details Patient Name: [name], [name] Date of Service: 07/10/2020 8:00 AM Medical Record Number: [id number] Patient Account Number: [id number] Date of Birth/Sex: 08/31/1980 (40 y.o. M) Treating RN: Dolan Amen Primary Care Ivery Michalski: [name] Other Clinician: Referring Tumeka Chimenti: [name] Treating Jacarri Gesner/Extender: [name] Cliche in Treatment: 26 Visit Information History Since Last Visit Pain Present Now: Yes Patient Arrived: Ambulatory Arrival Time: 08:13 Accompanied By: self Transfer Assistance: None Patient Identification Verified: Yes Secondary Verification Process Completed: Yes Patient Requires Transmission-Based Precautions: No Patient Has Alerts: Yes Electronic Signature(s) Signed: 07/10/2020 3:32:14 PM By: Georges Mouse, Minus Breeding RN Entered By: Georges Mouse, Minus Breeding on 07/10/2020 08:13:35 [name] ([id number]) -------------------------------------------------------------------------------- Clinic Level of Care Assessment Details Patient Name: [name] Date of Service: 07/10/2020 8:00 AM Medical Record Number: [id number] Patient Account Number: [id number] Date of Birth/Sex: November 23, 1980 (40 y.o. M) Treating RN: Carlene Coria Primary Care Emree Locicero: [name] Other Clinician: Referring Ezrah Dembeck: [name] Treating Tanish Sinkler/Extender: [name] Cliche in Treatment: [facility] Level of Care Assessment Items TOOL 4 Quantity Score X - Use when only an EandM is performed on FOLLOW-UP visit 1 0 ASSESSMENTS - Nursing Assessment / Reassessment X - Reassessment of Co-morbidities (includes updates in patient status) 1 10 X- 1 5 Reassessment of Adherence to Treatment Plan ASSESSMENTS - Wound and Skin Assessment / Reassessment X - Simple Wound Assessment / Reassessment - one wound 1 5 []  - 0 Complex Wound Assessment / Reassessment - multiple wounds []  - 0 Dermatologic /  Skin Assessment (not related to wound area) ASSESSMENTS - Focused Assessment []  - Circumferential Edema Measurements - multi extremities 0 []  - 0 Nutritional Assessment / Counseling / Intervention []  - 0 Lower Extremity Assessment (monofilament, tuning fork, pulses) []  - 0 Peripheral Arterial Disease Assessment (using hand held doppler) ASSESSMENTS - Ostomy and/or Continence Assessment and Care []  - Incontinence Assessment and Management 0 []  - 0 Ostomy Care Assessment and Management (repouching, etc.) PROCESS - Coordination of Care X - Simple Patient / Family Education for ongoing care 1 15 []  - 0 Complex (extensive) Patient / Family Education for ongoing care X- 1 10 Staff obtains Consents, Records, Test Results / Process Orders []  - 0 Staff telephones HHA, Nursing Homes / Clarify orders / etc []  - 0 Routine Transfer to another Facility (non-emergent condition) []  - 0 Routine Hospital Admission (non-emergent condition) []  - 0 New Admissions / Biomedical engineer / Ordering NPWT, Apligraf, etc. []  - 0 Emergency Hospital Admission (emergent condition) X- 1 10 Simple Discharge Coordination []  - 0 Complex (extensive) Discharge Coordination PROCESS - Special Needs []  - Pediatric / Minor Patient Management 0 []  - 0 Isolation Patient Management []  - 0 Hearing / Language / Visual special needs []  - 0 Assessment of Community assistance (transportation, D/C planning, etc.) []  - 0 Additional assistance / Altered mentation []  - 0 Support Surface(s) Assessment (bed, cushion, seat, etc.) INTERVENTIONS - Wound Cleansing / Measurement [name], [name] ([id number]) X- 1 5 Simple Wound Cleansing - one wound []  - 0 Complex Wound Cleansing - multiple wounds X- 1 5 Wound Imaging (photographs - any number of wounds) []  - 0 Wound Tracing (instead of photographs) X- 1 5 Simple Wound Measurement - one wound []  - 0 Complex Wound Measurement - multiple wounds INTERVENTIONS - Wound  Dressings []  - Small Wound Dressing one or multiple wounds 0 X- 1 15 Medium Wound Dressing one or multiple wounds []  - 0 Large Wound Dressing one or multiple wounds  X- 1 5 Application of Medications - topical []  - 0 Application of Medications - injection INTERVENTIONS - Miscellaneous []  - External ear exam 0 []  - 0 Specimen Collection (cultures, biopsies, blood, body fluids, etc.) []  - 0 Specimen(s) / Culture(s) sent or taken to Lab for analysis []  - 0 Patient Transfer (multiple staff / Civil Service fast streamer / Similar devices) []  - 0 Simple Staple / Suture removal (25 or less) []  - 0 Complex Staple / Suture removal (26 or more) []  - 0 Hypo / Hyperglycemic Management (close monitor of Blood Glucose) []  - 0 Ankle / Brachial Index (ABI) - do not check if billed separately X- 1 5 Vital Signs Has the patient been seen at the hospital within the last three years: Yes Total Score: 95 Level Of Care: New/Established - Level 3 Electronic Signature(s) Signed: 07/10/2020 4:57:18 PM By: Carlene Coria RN Entered By: Carlene Coria on 07/10/2020 08:43:11 [name] ([id number]) -------------------------------------------------------------------------------- Lower Extremity Assessment Details Patient Name: [name] Date of Service: 07/10/2020 8:00 AM Medical Record Number: [id number] Patient Account Number: [id number] Date of Birth/Sex: 1980-07-16 (40 y.o. M) Treating RN: Dolan Amen Primary Care Taegen Lennox: [name] Other Clinician: Referring Myleen Brailsford: [name] Treating Montey Ebel/Extender: Jeri Cos Weeks in Treatment: 187 Edema Assessment Assessed: [Left: Yes] [Right: No] Edema: [Left: N] [Right: o] Calf Left: Right: Point of Measurement: 33 cm From Medial Instep 48 cm Ankle Left: Right: Point of Measurement: 12 cm From Medial Instep 30.2 cm Electronic Signature(s) Signed: 07/10/2020 3:32:14 PM By: Georges Mouse, Minus Breeding RN Entered By: Georges Mouse, Minus Breeding on  07/10/2020 08:24:47 [name] ([id number]) -------------------------------------------------------------------------------- Multi Wound Chart Details Patient Name: [name] Date of Service: 07/10/2020 8:00 AM Medical Record Number: [id number] Patient Account Number: [id number] Date of Birth/Sex: [birth year] (39 y.o. M) Treating RN: Carlene Coria Primary Care Abdulah Iqbal: [name] Other Clinician: Referring Cecile Guevara: [name] Treating Suzannah Bettes/Extender: [name] Cliche in Treatment: 187 Vital Signs Height(in): 71 Pulse(bpm): 89 Weight(lbs): 338 Blood Pressure(mmHg): 125/84 Body Mass Index(BMI): 47 Temperature(F): 98.4 Respiratory Rate(breaths/min): 18 Photos: [N/A:N/A] Wound Location: Left, Lateral Lower Leg N/A N/A Wounding Event: Gradually Appeared N/A N/A Primary Etiology: Pyoderma N/A N/A Comorbid History: Sleep Apnea, Hypertension, Colitis N/A N/A Date Acquired: 11/18/2015 N/A N/A Weeks of Treatment: 187 N/A N/A Wound Status: Open N/A N/A Measurements L x W x D (cm) 6.5x7x0.7 N/A N/A Area (cm) : 35.736 N/A N/A Volume (cm) : 25.015 N/A N/A % Reduction in Area: -628.00% N/A N/A % Reduction in Volume: -537.00% N/A N/A Classification: Full Thickness With Exposed N/A N/A Support Structures Exudate Amount: Large N/A N/A Exudate Type: Serosanguineous N/A N/A Exudate Color: red, brown N/A N/A Wound Margin: Epibole N/A N/A Granulation Amount: Medium (34-66%) N/A N/A Granulation Quality: Red N/A N/A Necrotic Amount: Medium (34-66%) N/A N/A Exposed Structures: Fat Layer (Subcutaneous Tissue): N/A N/A Yes Muscle: Yes Fascia: No Tendon: No Joint: No Bone: No Epithelialization: None N/A N/A Treatment Notes Electronic Signature(s) Signed: 07/10/2020 4:57:18 PM By: Carlene Coria RN Entered By: Carlene Coria on 07/10/2020 08:37:56 [name] ([id number]) -------------------------------------------------------------------------------- Multi-Disciplinary  Care Plan Details Patient Name: [name] Date of Service: 07/10/2020 8:00 AM Medical Record Number: [id number] Patient Account Number: [id number] Date of Birth/Sex: 09-05-1980 (39 y.o. M) Treating RN: Carlene Coria Primary Care Reesha Debes: [name] Other Clinician: Referring Donnia Poplaski: [name] Treating Tais Koestner/Extender: [name] Cliche in Treatment: [place] reviewed with physician Active Inactive Wound/Skin Impairment Nursing Diagnoses: Impaired tissue integrity Knowledge deficit related to ulceration/compromised skin integrity Goals: Patient/caregiver will  verbalize understanding of skin care regimen Date Initiated: 12/11/2016 Target Resolution Date: 07/24/2017 Goal Status: Active Ulcer/skin breakdown will have a volume reduction of 30% by week 4 Date Initiated: 12/11/2016 Date Inactivated: 06/26/2020 Target Resolution Date: 03/13/2017 Goal Status: Unmet Unmet Reason: comorbities Ulcer/skin breakdown will have a volume reduction of 50% by week 8 Date Initiated: 12/11/2016 Date Inactivated: 06/26/2020 Target Resolution Date: 03/13/2017 Goal Status: Unmet Unmet Reason: comorbities Ulcer/skin breakdown will have a volume reduction of 80% by week 12 Date Initiated: 12/11/2016 Date Inactivated: 06/26/2020 Target Resolution Date: 03/13/2017 Goal Status: Unmet Unmet Reason: comorbities Ulcer/skin breakdown will heal within 14 weeks Date Initiated: 12/11/2016 Date Inactivated: 06/26/2020 Target Resolution Date: 03/13/2017 Goal Status: Unmet Unmet Reason: comorbities Interventions: Assess patient/caregiver ability to obtain necessary supplies Assess patient/caregiver ability to perform ulcer/skin care regimen upon admission and as needed Assess ulceration(s) every visit Provide education on ulcer and skin care Treatment Activities: Skin care regimen initiated : 12/11/2016 Topical wound management initiated : 12/11/2016 Notes: Electronic  Signature(s) Signed: 07/10/2020 4:57:18 PM By: Carlene Coria RN Entered By: Carlene Coria on 07/10/2020 08:37:36 [name], [name] ([id number]) -------------------------------------------------------------------------------- Pain Assessment Details Patient Name: [name] Date of Service: 07/10/2020 8:00 AM Medical Record Number: [id number] Patient Account Number: [id number] Date of Birth/Sex: 1981/02/27 (40 y.o. M) Treating RN: Dolan Amen Primary Care Jud Fanguy: [name] Other Clinician: Referring Candence Sease: [name] Treating Oneal Biglow/Extender: [name] Cliche in Treatment: 187 Active Problems Location of Pain Severity and Description of Pain Patient Has Paino Yes Site Locations Rate the pain. Current Pain Level: 3 Pain Management and Medication Current Pain Management: Electronic Signature(s) Signed: 07/10/2020 3:32:14 PM By: Georges Mouse, Minus Breeding RN Entered By: Georges Mouse, [place] on 07/10/2020 08:16:15 [name], [name] ([id number]) -------------------------------------------------------------------------------- Wound Assessment Details Patient Name: [name] Date of Service: 07/10/2020 8:00 AM Medical Record Number: [id number] Patient Account Number: [id number] Date of Birth/Sex: November 08, 1980 (40 y.o. M) Treating RN: Dolan Amen Primary Care Lillyanne Bradburn: [name] Other Clinician: Referring Briannie Gutierrez: [name] Treating Pearley Baranek/Extender: [name] Cliche in Treatment: 187 Wound Status Wound Number: 1 Primary Etiology: Pyoderma Wound Location: Left, Lateral Lower Leg Wound Status: Open Wounding Event: Gradually Appeared Comorbid History: Sleep Apnea, Hypertension, Colitis Date Acquired: 11/18/2015 Weeks Of Treatment: 187 Clustered Wound: No Photos Wound Measurements Length: (cm) 6.5 Width: (cm) 7 Depth: (cm) 0.7 Area: (cm) 35.736 Volume: (cm) 25.015 % Reduction in Area: -628% % Reduction in Volume: -537% Epithelialization:  None Tunneling: No Undermining: No Wound Description Classification: Full Thickness With Exposed Support Structures Wound Margin: Epibole Exudate Amount: Large Exudate Type: Serosanguineous Exudate Color: red, brown Foul Odor After Cleansing: No Slough/Fibrino Yes Wound Bed Granulation Amount: Medium (34-66%) Exposed Structure Granulation Quality: Red Fascia Exposed: No Necrotic Amount: Medium (34-66%) Fat Layer (Subcutaneous Tissue) Exposed: Yes Necrotic Quality: Adherent Slough Tendon Exposed: No Muscle Exposed: Yes Necrosis of Muscle: No Joint Exposed: No Bone Exposed: No Electronic Signature(s) Signed: 07/10/2020 3:32:14 PM By: Georges Mouse, Minus Breeding RN Entered By: Georges Mouse, Minus Breeding on 07/10/2020 08:23:17 [name] ([id number]) -------------------------------------------------------------------------------- Vitals Details Patient Name: [name] Date of Service: 07/10/2020 8:00 AM Medical Record Number: [id number] Patient Account Number: [id number] Date of Birth/Sex: 1981/05/04 (39 y.o. M) Treating RN: Dolan Amen Primary Care Georganne Siple: [name] Other Clinician: Referring Marysa Wessner: [name] Treating Summit Borchardt/Extender: [name] Cliche in Treatment: 187 Vital Signs Time Taken: 08:13 Temperature (F): 98.4 Height (in): 71 Pulse (bpm): 89 Weight (lbs): 338 Respiratory Rate (breaths/min): 18 Body Mass Index (BMI): 47.1 Blood Pressure (mmHg): 125/84 Reference Range: 80 - 120  mg / dl Electronic Signature(s) Signed: 07/10/2020 3:32:14 PM By: Georges Mouse, Minus Breeding RN Entered By: Georges Mouse, Minus Breeding on 07/10/2020 08:16:08

## 2020-07-12 LAB — AEROBIC CULTURE W GRAM STAIN (SUPERFICIAL SPECIMEN)

## 2020-07-13 ENCOUNTER — Other Ambulatory Visit: Payer: Self-pay | Admitting: Primary Care

## 2020-07-13 DIAGNOSIS — F3342 Major depressive disorder, recurrent, in full remission: Secondary | ICD-10-CM

## 2020-07-13 DIAGNOSIS — E785 Hyperlipidemia, unspecified: Secondary | ICD-10-CM

## 2020-07-17 ENCOUNTER — Ambulatory Visit: Payer: 59 | Admitting: Physician Assistant

## 2020-07-24 ENCOUNTER — Encounter: Payer: 59 | Attending: Physician Assistant | Admitting: Physician Assistant

## 2020-07-24 ENCOUNTER — Other Ambulatory Visit: Payer: Self-pay

## 2020-07-24 DIAGNOSIS — I872 Venous insufficiency (chronic) (peripheral): Secondary | ICD-10-CM | POA: Insufficient documentation

## 2020-07-24 DIAGNOSIS — L97321 Non-pressure chronic ulcer of left ankle limited to breakdown of skin: Secondary | ICD-10-CM | POA: Insufficient documentation

## 2020-07-24 DIAGNOSIS — L03116 Cellulitis of left lower limb: Secondary | ICD-10-CM | POA: Insufficient documentation

## 2020-07-24 DIAGNOSIS — L97222 Non-pressure chronic ulcer of left calf with fat layer exposed: Secondary | ICD-10-CM | POA: Insufficient documentation

## 2020-07-24 DIAGNOSIS — L88 Pyoderma gangrenosum: Secondary | ICD-10-CM | POA: Diagnosis not present

## 2020-07-24 DIAGNOSIS — F17200 Nicotine dependence, unspecified, uncomplicated: Secondary | ICD-10-CM | POA: Insufficient documentation

## 2020-07-24 NOTE — Progress Notes (Addendum)
[name], [name] ([id number]) Visit Report for 07/24/2020 Chief Complaint Document Details Patient Name: [name], [name] Date of Service: 07/24/2020 8:00 AM Medical Record Number: [id number] Patient Account Number: [id number] Date of Birth/Sex: 1981/02/04 (40 y.o. 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 M) Treating RN: Carlene Coria Primary Care Provider: [name] Other Clinician: Jeanine Luz Referring Provider: [name] Treating Provider/Extender: [name] Cliche in Treatment: 189 History of Present Illness HPI Description: 12/04/16; 40 year old man who comes into the clinic today for review of a wound on the posterior left calf. He tells me that is been there for about a year. He is not a diabetic he does smoke half a pack per day. He was seen in the ER on 11/20/16 felt to have cellulitis around the wound and was given clindamycin. An x-ray did not show osteomyelitis. The patient initially tells me that he has a milk allergy that sets off a pruritic itching rash on his lower legs which she scratches incessantly and he thinks that's what may have set up the wound. He has been using various topical antibiotics and ointments without any effect. He works in a [employer] and is on his feet all day. He  does not have a prior history of wounds however he does have the rash on both lower legs the right arm and the ventral aspect of his left arm. These are excoriations and clearly have had scratching however there are of macular looking areas on both legs including a substantial larger area on the right leg. This does not have an underlying open area. There is no blistering. The patient tells me that 2 years ago in [place] in response to the rash on his legs he saw a dermatologist who told him he had a condition which may be pyoderma gangrenosum although I may be putting words into his mouth. He seemed to recognize this. 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 He is not in any treatment for this. He's had no recent travel 12/11/16; the patient arrives today with his wound and roughly the same condition we've been using silver alginate this is a deep punched out wound with some surrounding erythema but no tenderness. Biopsy I did did not show confirmed pyoderma gangrenosum suggested nonspecific inflammation and vasculitis but does not provide an actual description of what was seen by the pathologist. I'm really not able to understand this We have also received information from the patient's dermatologist in [place] notes from April 2016. This was a doctor Agarwal-antal. 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 He was prescribed topical steroid high potency under occlusion which helped but at this point the patient did not have a deep punched out wound. 12/18/16; the patient's wound is larger in terms of surface area however this surface looks better and there is less depth. The surrounding erythema also is better. The patient states that the wrap we put on came off 2 days ago when he has been  using his compression stockings. He we are in the process of getting a dermatology consult. 12/26/16 on evaluation today patient's left lower extremity wound  shows evidence of infection with surrounding erythema noted. He has been tolerating the dressing changes but states that he has noted more discomfort. There is a larger area of erythema surrounding the wound. No fevers, chills, nausea, or vomiting noted at this time. With that being said the wound still does have slough covering the surface. He is not allergic to any medication that he is aware of at this point. In regard to his right lower extremity he had several regions that are erythematous and pruritic he wonders if there's anything we can do to help that. 01/02/17 I reviewed patient's wound culture which was obtained his visit last week. He was placed on doxycycline at that point. Unfortunately that does not appear to be an antibiotic that would likely help with the situation however the pseudomonas noted on culture is sensitive to Cipro. Also unfortunately patient's wound seems to have a large compared to last week's evaluation. Not severely so but there are definitely increased measurements in general. He is continuing to have discomfort as well he writes this to be a seven out of 10. In fact he would prefer me not to perform any debridement today due to the fact that he is having discomfort and considering he has an active infection on the little reluctant to do so anyway. No fevers, chills, nausea, or vomiting noted at this time. 01/08/17; patient seems dermatology on September 5. I suspect dermatology will want the slides from the biopsy I did sent to their pathologist. I'm not sure if there is a way we can expedite that. In any case the culture I did before I left on vacation 3 weeks ago showed Pseudomonas he was given 10 days of Cipro and per her description of her intake nurses is actually somewhat better this week although the wound is quite a bit bigger than I remember the last time I saw this. He still has 3 more days of Cipro 01/21/17; dermatology appointment tomorrow. He has completed  the ciprofloxacin for Pseudomonas. Surface of the wound looks better however he is had some deterioration in the lesions on his right leg. Meantime the left lateral leg wound we will continue with sample 01/29/17; patient had his dermatology appointment but I can't yet see that note. He is completed his antibiotics. The wound is more superficial but considerably larger in circumferential area than when he came in. This is in his left lateral calf. He also has swollen erythematous areas with superficial wounds on the right leg and small papular areas on both arms. There apparently areas in her his upper thighs and buttocks I did not look at those. Dermatology biopsied the right leg. Hopefully will have their input next week. 02/05/17; patient went back to see his dermatologist who told him that he had a "scratching problem" as well as staph. He is now on a 30 day course of doxycycline and I believe she gave him triamcinolone cream to the right leg areas to help with the itching [not exactly sure but probably triamcinolone]. She apparently looked at the left lateral leg wound although this was not rebiopsied and I think felt to be ultimately part of the same pathogenesis. He is using sample border foam and changing nevus himself. He now has a new open area on the right posterior leg which was his biopsy site I don't have any  of the dermatology notes 02/12/17; we put the patient in compression last week with SANTYL to the wound on the left leg and the biopsy. Edema is much better and the depth of the wound is now at level of skin. Area is still the same oBiopsy site on the right lateral leg we've also been using santyl with a border foam dressing and he is changing this himself. 02/19/17; Using silver alginate started last week to both the substantial left leg wound and the biopsy site on the right wound. He is tolerating compression well. Has a an appointment with his primary M.D. tomorrow wondering about  diuretics although I'm wondering if the edema problem is actually lymphedema 02/26/17; the patient has been to see his primary doctor Dr. Jerrel Ivory at [place] our primary care. She started him on Lasix 20 mg and this seems to have helped with the edema. However we are not making substantial change with the left lateral calf wound and inflammation. The biopsy site on the right leg also looks stable but not really all that different. 03/12/17; the patient has been to see vein and vascular Dr. Lucky Cowboy. He has had venous reflux studies I have not reviewed these. I did get a call from his dermatology office. They felt that he might have pathergy based on their biopsy on his right leg which led them to look at the slides of [name], [name] ([id number]) the biopsy I did on the left leg and they wonder whether this represents pyoderma gangrenosum which was the original supposition in a man with ulcerative colitis albeit inactive for many years. They therefore recommended clobetasol and tetracycline i.e. 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 03/26/17; apparently the patient just had reflux studies not an appointment with Dr. dew. She arrives in clinic today having applied clobetasol for 2-3 weeks. He notes over the last 2-3 days excessive drainage having to change the dressing 3-4 times a day and also expanding erythema. 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 Lillia Carmel at [facility] dermatology locally who kindly saw him at my request. 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 My concern was the degree of erythema medially extending from his left leg wound which was either  inflammation from pyoderma or cellulitis. I put him on Augmentin however culture of the wound showed Pseudomonas which is quinolone sensitive. I really don't believe he has cellulitis however in view of everything I will continue and give him a course of Cipro. He is also on doxycycline as an immune modulator for the pyoderma. In addition to his original wound on the left lateral leg with surrounding erythema he has a wound on the right posterior calf which was an original biopsy site done by dermatology. 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 Saw Dr. Lillia Carmel yesterday. He has been using topical antibiotics to both wound areas his original wound on the left and the biopsies/pathergy area on the right. There is definitely some improvement in the inflammation around the wound on the right although the patient states he has increasing sensitivity of the wounds. He is on prednisone 60 and doxycycline 1 as prescribed by Dr. Lillia Carmel. He is covering the  topical antibiotic with gauze and putting this in his own compression stocks and changing this daily. He states that Dr. Lottie Rater did a culture of the left leg wound yesterday 05/07/17; pyoderma gangrenosum. The patient saw Dr. Lillia Carmel yesterday and has a follow-up with her in one month. He is still using topical antibiotics to both wounds although he can't recall exactly what type. He is still on prednisone 60 mg. Dr. Lillia Carmel stated that the doxycycline could stop if we were in agreement. 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 The right medial leg was induced by biopsy/pathergy. The area on the right is essentially healed. 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 The right medial leg has  closed and remains closed. He is still on prednisone 60. oHe tells me he missed his last dermatology appointment with Dr. Lillia Carmel but will make another appointment. He reports that her blood sugar at a recent screen in [place] was high 200's. He was 180 today. 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 I think he is going to require still much longer prednisone perhaps another 3 months before attempting to taper. In the meantime his wound is a lot better. Smaller. He is cleaning this off daily and applying topical antibiotics. When he was last in the clinic I thought about changing to [facility] and actually put in a couple of calls to dermatology although probably not during their business hours. 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 He continues on oral prednisone. He has been using triple antibiotic ointment. There is surface debris and we will transition to [name] and have him return in 2 weeks. He has lost 30 pounds since his last appointment with lifestyle modification. He may benefit from topical steroid cream for treatment this can be considered at a later date. 08/22/17 on evaluation today patient appears to actually be doing rather well in regard to his left lateral lower extremity ulcer. He has actually been managed by Dr. Dellia Nims most recently. Patient is currently on oral steroids at this time. This seems to have been of benefit for him. Nonetheless his last visit was actually with Leah on 08/06/17. Currently he is not utilizing any topical steroid creams although this could be of benefit as well. No fevers, chills, nausea, or vomiting noted at this time. 09/05/17 on evaluation today patient appears to be doing better in regard to his left lateral lower extremity ulcer. He has been tolerating the dressing changes without complication. He is using Santyl  with good effect. Overall I'm very pleased with how things are standing at this point. Patient likewise is happy that this is doing better. 09/19/17 on evaluation today patient actually appears to be doing rather well in regard to his left lateral lower extremity ulcer. 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 He's not having any significant pain. 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 He has been tolerating the Santyl without complication and in general I feel like he's making good progress. 10/17/17  on evaluation today patient appears to be doing very well in regard to his left lateral lower surety ulcer. He has been tolerating the dressing changes without complication. There does not appear to be any evidence of infection he's alternating the Santyl and the triple antibiotic ointment every other day this seems to be doing well for him. 11/03/17 on evaluation today patient appears to be doing very well in regard to his left lateral lower extremity ulcer. He is been tolerating the dressing changes without complication which is good news. Fortunately there does not appear to be any evidence of infection which is also great news. Overall is doing excellent they are starting to taper down on the prednisone is down to 40 mg at this point it also started topical clobetasol for him. 11/17/17 on evaluation today patient appears to be doing well in regard to his left lateral lower surety ulcer. He's been tolerating the dressing changes without complication. He does note that he is having no pain, no excessive drainage or discharge, and overall he feels like things are going about how he would expect and hope they would. Overall he seems to have no evidence of infection at this time in my opinion which is  good news. 12/04/17-He is seen in follow-up evaluation for right lateral lower extremity ulcer. He has been applying topical steroid cream. Today's measurement show slight increase in size. Over the next 2 weeks we will transition to every other day Santyl and steroid cream. He has been encouraged to monitor for changes and notify clinic with any concerns 12/15/17 on evaluation today patient's left lateral motion the ulcer and fortunately is doing worse again at this point. This just since last week to this week has close to doubled in size according to the patient. I did not seeing last week's I do not have a visual to compare this to in our system was also down so we do not have all the charts and at this point. Nonetheless it does have me somewhat concerned in regard to the fact that again he was worried enough about it he has contact the dermatology that placed them back on the full strength, 50 mg a day of the prednisone that he was taken previous. He continues to alternate using clobetasol along with Santyl at this point. He is obviously somewhat frustrated. 12/22/17 on evaluation today patient appears to be doing a little worse compared to last evaluation. Unfortunately the wound is a little deeper and slightly larger than the last week's evaluation. With that being said he has made some progress in regard to the irritation surrounding at this time unfortunately despite that progress that's been made he still has a significant issue going on here. 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 [name], [name] ([id number]) For that reason I discussed with him today the possibility of perform a wound culture to ensure there's nothing overtly infected. 01/06/18 on evaluation today patient's wound is larger and deeper than previously evaluated. With that being  said it did appear that his wound was infected after my last evaluation with him. Subsequently I did end up prescribing a prescription for Bactrim DS which she has been taking and having no complication with. Fortunately there does not appear to be any evidence of infection at this point in time as far as anything spreading, no want to touch, and  overall I feel like things are showing signs of improvement. 01/13/18 on evaluation today patient appears to be even a little larger and deeper than last time. There still muscle exposed in the base of the wound. Nonetheless he does appear to be less erythematous I do believe inflammation is calming down also believe the infection looks like it's probably resolved at this time based on what I'm seeing. No fevers, chills, nausea, or vomiting noted at this time. 01/30/18 on evaluation today patient actually appears to visually look better for the most part. Unfortunately those visually this looks better he does seem to potentially have what may be an abscess in the muscle that has been noted in the central portion of the wound. This is the first time that I have noted what appears to be fluctuance in the central portion of the muscle. With that being said I'm somewhat more concerned about the fact that this might indicate an abscess formation at this location. I do believe that an ultrasound would be appropriate. This is likely something we need to try to do as soon as possible. He has been switch to mupirocin ointment and he is no longer using the steroid ointment as prescribed by dermatology he sees them again next week he's been decreased from 60 to 40 mg of prednisone. 03/09/18 on evaluation today patient actually appears to be doing a little better compared to last time I saw him. There's not as much erythema surrounding the wound itself. He I did review his most recent infectious disease note which was dated 02/24/18. He saw Dr. Michel Bickers in [place].  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 Patient is now off of antibiotics and they are continually observing this. There seems to be no change in the past two weeks in my pinion based on what the patient says and what I see today compared to what Dr. Megan Salon likely saw two weeks ago. No fevers, chills, nausea, or vomiting noted at this time. 03/23/18 on evaluation today patient's wound actually appears to be showing signs of improvement which is good news. He is currently still on the Dapsone. He is also working on tapering the prednisone to get off of this and Dr. Lottie Rater is working with him in this regard. Nonetheless overall I feel like the wound is doing well it does appear based on the infectious disease note that I reviewed from Dr. 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 I did also review the results of his ultrasound of left lower extremity which revealed there was a dentist tissue in the base of the wound without an abscess noted. 04/06/18 on evaluation today the patient's left lateral lower extremity ulcer actually appears to be doing fairly well which is excellent news. There does not appear to be any evidence of infection at this time which is also great news. Overall he still does have a significantly large ulceration although little by little he seems to be making progress. He is down to 10 mg a day of the prednisone. 04/20/18 on evaluation today patient actually appears to be doing excellent at this time in regard to his left lower extremity ulcer. He's making signs of good progress unfortunately this is taking much longer than we would really like to see  but nonetheless he is making progress. Fortunately there does not appear to be any evidence of infection at this time. No  fevers, chills, nausea, or vomiting noted at this time. The patient has not been using the Santyl due to the cost he hadn't got in this field yet. He's mainly been using the antibiotic ointment topically. Subsequently he also tells me that he really has not been scrubbing in the shower I think this would be helpful again as I told him it doesn't have to be anything too aggressive to even make it believe just enough to keep it free of some of the loose slough and biofilm on the wound surface. 05/11/18 on evaluation today patient's wound appears to be making slow but sure progress in regard to the left lateral lower extremity ulcer. He is been tolerating the dressing changes without complication. Fortunately there does not appear to be any evidence of infection at this time. He is still just using triple antibiotic ointment along with clobetasol occasionally over the area. He never got the Santyl and really does not seem to intend to in my pinion. 06/01/18 on evaluation today patient appears to be doing a little better in regard to his left lateral lower extremity ulcer. He states that overall he does not feel like he is doing as well with the Dapsone as he did with the prednisone. Nonetheless he sees his dermatologist later today and is gonna talk to them about the possibility of going back on the prednisone. Overall again I believe that the wound would be better if you would utilize Santyl but he really does not seem to be interested in going back to the [place] at this point. He has been using triple antibiotic ointment. 06/15/18 on evaluation today patient's wound actually appears to be doing about the same at this point. Fortunately there is no signs of infection at this time. He has made slight improvements although he continues to not really want to clean the wound bed at this point. He states that he just doesn't mess with it he doesn't want to cause any problems with everything else he has going  on. He has been on medication, antibiotics as prescribed by his dermatologist, for a staff infection of his lower extremities which is really drying out now and looking much better he tells me. Fortunately there is no sign of overall infection. 06/29/18 on evaluation today patient appears to be doing well in regard to his left lateral lower surety ulcer all things considering. Fortunately his staff infection seems to be greatly improved compared to previous. He has no signs of infection and this is drying up quite nicely. He is still the doxycycline for this is no longer on cental, Dapsone, or any of the other medications. His dermatologist has recommended possibility of an infusion but right now he does not want to proceed with that. 07/13/18 on evaluation today patient appears to be doing about the same in regard to his left lateral lower surety ulcer. Fortunately there's no signs of infection at this time which is great news. Unfortunately he still builds up a significant amount of Slough/biofilm of the surface of the wound he still is not really cleaning this as he should be appropriately. Again I'm able to easily with saline and gauze remove the majority of this on the surface which if you would do this at home would likely be a dramatic improvement for him as far as getting the area to improve. Nonetheless overall I  still feel like he is making progress is just very slow. I think Santyl will be of benefit for him as well. Still he has not gotten this as of this point. 07/27/18 on evaluation today patient actually appears to be doing little worse in regards of the erythema around the periwound region of the wound he also tells me that he's been having more drainage currently compared to what he was experiencing last time I saw him. He states not quite as bad as what he had because this was infected previously but nonetheless is still appears to be doing poorly. Fortunately there is no evidence  of systemic infection at this point. The patient tells me that he is not going to be able to afford the Santyl. He is still waiting to hear about the infusion therapy with his dermatologist. Apparently she wants an updated colonoscopy first. 08/10/18 on evaluation today patient appears to be doing better in regard to his left lateral lower extremity ulcer. Fortunately he is showing signs of improvement in this regard he's actually been approved for Remicade infusion's as well although this has not been scheduled as of yet. Fortunately there's no signs of active infection at this time in regard to the wound although he is having some issues with infection of the right lower extremity is been seen as dermatologist for this. Fortunately they are definitely still working with him trying to keep things under control. [name], [name] ([id number]) 09/07/18 on evaluation today patient is actually doing rather well in regard to his left lateral lower extremity ulcer. He notes these actually having some hair grow back on his extremity which is something he has not seen in years. He also tells me that the pain is really not giving them any trouble at this time which is also good news overall she is very pleased with the progress he's using a combination of the mupirocin along with the probate is all mixed. 09/21/18 on evaluation today patient actually appears to be doing fairly well all things considered in regard to his looks from the ulcer. He's been tolerating the dressing changes without complication. Fortunately there's no signs of active infection at this time which is good news he is still on all antibiotics or prevention of the staff infection. He has been on prednisone for time although he states it is gonna contact his dermatologist and see if she put them on a short course due to some irritation that he has going on currently. Fortunately there's no evidence of any overall worsening this is going very slow  I think cental would be something that would be helpful for him although he states that $50 for tube is quite expensive. He therefore is not willing to get that at this point. 10/06/18 on evaluation today patient actually appears to be doing decently well in regard to his left lateral leg ulcer. He's been tolerating the dressing changes without complication. Fortunately there's no signs of active infection at this time. Overall I'm actually rather pleased with the progress he's making although it's slow he doesn't show any signs of infection and he does seem to be making some improvement. I do believe that he may need a switch up and dressings to try to help this to heal more appropriately and quickly. 10/19/18 on evaluation today patient actually appears to be doing better in regard to his left lateral lower extremity ulcer. This is shown signs of having much less Slough buildup at this point due to the fact  he has been using the Santyl. Obviously this is very good news. The overall size of the wound is not dramatically smaller but again the appearance is. 11/02/18 on evaluation today patient actually appears to be doing quite well in regard to his lower [place] ulcer. A lot of the skin around the ulcer is actually somewhat irritating at this point this seems to be more due to the dressing causing irritation from the adhesive that anything else. Fortunately there is no signs of active infection at this time. 11/24/18 on evaluation today patient appears to be doing a little worse in regard to his overall appearance of his lower extremity ulcer. There's more erythema and warmth around the wound unfortunately. He is currently on doxycycline which he has been on for some time. With that being said I'm not sure that seems to be helping with what appears to possibly be an acute cellulitis with regard to his left lower extremity ulcer. No fevers, chills, nausea, or vomiting noted at this time. 12/08/18 on  evaluation today patient's wounds actually appears to be doing significantly better compared to his last evaluation. He has been using Santyl along with alternating tripling about appointment as well as the steroid cream seems to be doing quite well and the wound is showing signs of improvement which is excellent news. Fortunately there's no evidence of infection and in fact his culture came back negative with only normal skin flora noted. 12/21/2018 upon evaluation today patient actually appears to be doing excellent with regard to his ulcer. This is actually the best that I have seen it since have been helping to take care of him. It is both smaller as well as less slough noted on the surface of the wound and seems to be showing signs of good improvement with new skin growing from the edges. He has been using just the triamcinolone he does wonder if he can get a refill of that ointment today. 01/04/2019 upon evaluation today patient actually appears to be doing well with regard to his left lateral lower extremity ulcer. With that being said it does not appear to be that he is doing quite as well as last time as far as progression is concerned. There does not appear to be any signs of infection or significant irritation which is good news. With that being said I do believe that he may benefit from switching to a collagen based dressing based on how clean The wound appears. 01/18/2019 on evaluation today patient actually appears to be doing well with regard to his wound on the left lower extremity. He is not made a lot of progress compared to where we were previous but nonetheless does seem to be doing okay at this time which is good news. There is no signs of active infection which is also good news. My only concern currently is I do wish we can get him into utilizing the collagen dressing his insurance would not pay for the supplies that we ordered although it appears that he may be able to order this  through his supply company that he typically utilizes. This is [employer]. Nonetheless he did try to order it during the office visit today and it appears this did go through. We will see if he can get that it is a different brand but nonetheless he has collagen and I do think will be beneficial. 02/01/2019 on evaluation today patient actually appears to be doing a little worse today in regard to the overall size of  his wounds. Fortunately there is no signs of active infection at this time. That is visually. Nonetheless when this is happened before it was due to infection. For that reason were somewhat concerned about that this time as well. 02/08/2019 on evaluation today patient unfortunately appears to be doing slightly worse with regard to his wound upon evaluation today. Is measuring a little deeper and a little larger unfortunately. I am not really sure exactly what is causing this to enlarge he actually did see his dermatologist she is going to see about initiating Humira for him. Subsequently she also did do steroid injections into the wound itself in the periphery. Nonetheless still nonetheless he seems to be getting a little bit larger he is gone back to just using the steroid cream topically which I think is appropriate. I would say hold off on the collagen for the time being is definitely a good thing to do. Based on the culture results which we finally did get the final result back regarding it shows staph as the bacteria noted again that can be a normal skin bacteria based on the fact however he is having increased drainage and worsening of the wound measurement wise I would go ahead and place him on an antibiotic today I do believe for this. 02/15/2019 on evaluation today patient actually appears to be doing somewhat better in regard to his ulcer. There is no signs of worsening at this time I did review his culture results which showed evidence of Staphylococcus aureus but not MRSA. Again  this could just be more related to the normal skin bacteria although he states the drainage has slowed down quite a bit he may have had a mild infection not just colonization. And was much smaller and then since around10/04/2019 on evaluation today patient appears to be doing unfortunately worse as far as the size of the wound. I really feel like that this is steadily getting larger again it had been doing excellent right at the beginning of September we have seen a steady increase in the area of the wound it is almost 2-1/2 times the size it was on September 1. Obviously this is a bad trend this is not wanting to see. For that reason we went back to using just the topical triamcinolone cream which does seem to help with inflammation. I checked him for bacteria by way of culture and nothing showed positive there. I am considering giving him a short course of a tapering steroid Dosepak today to see if that is can be beneficial for him. The patient is in agreement with giving that a try. 03/08/2019 on evaluation today patient appears to be doing very well in comparison to last evaluation with regard to his lower extremity ulcer. This is showing signs of less inflammation and actually measuring slightly smaller compared to last time every other week over the past month and a half he has been measuring larger larger larger. Nonetheless I do believe that the issue has been inflammation the prednisone does seem to [facility], [place] ([id number]) have been beneficial for him which is good news. No fevers, chills, nausea, vomiting, or diarrhea. 03/22/2019 on evaluation today patient appears to be doing about the same with regard to his leg ulcer. He has been tolerating the dressing changes without complication. With that being said the wound seems to be mostly arrested at its current size but really is not making any progress except for when we prescribed the prednisone. He did show some signs of  dropping as far as  the overall size of the wound during that interval week. Nonetheless this is something he is not on long-term at this point and unfortunately I think he is getting need either this or else the Humira which his dermatologist has discussed try to get approval for. With that being said he will be seeing his dermatologist on the 11th of this month that is November. 04/19/2019 on evaluation today patient appears to be doing really about the same the wound is measuring slightly larger compared to last time I saw him. He has not been into the office since November 2 due to the fact that he unfortunately had Covid as that his entire family. He tells me that it was rough but they did pull-through and he seems to be doing much better. Fortunately there is no signs of active infection at this time. No fevers, chills, nausea, vomiting, or diarrhea. 05/10/2019 on evaluation today patient unfortunately appears to be doing significantly worse as compared to last time I saw him. He does tell me that he has had his first dose of Humira and actually is scheduled to get the next one in the upcoming week. With that being said he tells me also that in the past several days he has been having a lot of issues with green drainage she showed me a picture this is more blue-green in color. He is also been having issues with increased sloughy buildup and the wound does appear to be larger today. Obviously this is not the direction that we want everything to take based on the starting of his Humira. Nonetheless I think this is definitely a result of likely infection and to be honest I think this is probably Pseudomonas causing the infection based on what I am seeing. 05/24/2019 on evaluation today patient unfortunately appears to be doing significantly worse compared to his prior evaluation with me 2 weeks ago. I did review his culture results which showed that he does have Staph aureus as well as Pseudomonas noted on the culture.  Nonetheless the Levaquin that I prescribed for him does not appear to have been appropriate and in fact he tells me he is no longer experiencing the green drainage and discharge that he had at the last visit. Fortunately there is no signs of active infection at this time which is good news although the wound has significantly worsened it in fact is much deeper than it was previous. We have been utilizing up to this point triamcinolone ointment as the prescription topical of choice but at this time I really feel like that the wound is getting need to be packed in order to appropriately manage this due to the deeper nature of the wound. Therefore something along the lines of an alginate dressing may be more appropriate. 05/31/2019 upon inspection today patient's wound actually showed signs of doing poorly at this point. Unfortunately he just does not seem to be making any good progress despite what we have tried. He actually did go ahead and pick up the Cipro and start taking that as he was noticing more green drainage he had previously completed the Levaquin that I prescribed for him as well. Nonetheless he missed his appointment for the seventh last week on Wednesday with the wound care center and [facility] where his dermatologist referred him. Obviously I do think a second opinion would be helpful at this point especially in light of the fact that the patient seems to be doing so  poorly despite the fact that we have tried everything that I really know how at this point. The only thing that ever seems to have helped him in the past is when he was on high doses of continual steroids that did seem to make a difference for him. 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 06/07/2019 upon evaluation today patient unfortunately appears to be doing  worse yet again with regard to his wound. In fact I am starting to question whether or not he may have a fluid pocket in the muscle at this point based on the bulging and the soft appearance to the central portion of the muscle area. There is not anything draining from the muscle itself at this time which is good news but nonetheless the wound is expanding. I am not really seeing any results of the Humira as far as overall wound progression based on what I am seeing at this point. The patient has been referred for second opinion with regard to his wound to the [facility] wound care center by his dermatologist which I definitely am not in opposition to. Unfortunately we tried multiple dressings in the past including collagen, alginate, and at one point even Hydrofera Blue. With that being said he is never really used it for any significant amount of time due to the fact that he often complains of pain associated with these dressings and then will go back to either using the Santyl which she has done intermittently or more frequently the triamcinolone. He is also using his own compression stockings. We have wrapped him in the past but again that was something else that he really was not a big fan of. 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 He seemed to do much better when he was on the steroids although honestly I understand that the steroids are not necessarily the best medication to be on long-term obviously 06/14/2019 on evaluation today patient appears to be doing actually a little bit better with regard to the overall appearance with his leg. Unfortunately he does continue to have  issues with what appears to be some fluid underneath the muscle although he did see the wound specialty center at [facility] last week their main goals were to see about infusion therapy in place of the Humira as they feel like that is not quite strong enough. They also recommended that we continue with the treatment otherwise as we are they felt like that was appropriate and they are okay with him continuing to follow-up here with [place] in that regard. With that being said they are also sending him to the vein specialist there to see about vein stripping and if that would be of benefit for him. Subsequently they also did not really address whether or not an ultrasound of the muscle area to see if there is anything that needs to be addressed here would be appropriate or not. For that reason I discussed this with him last  week I think we may proceed down that road at this point. 06/21/2019 upon evaluation today patient's wound actually appears to be doing slightly better compared to previous evaluations. I do believe that he has made a difference with regard to the progression here with the use of oral steroids. Again in the past has been the only thing that is really calm things down. He does tell me that from [facility] is gotten a good news from there that there are no further vein stripping that is necessary at this point. I do not have that available for review today although the patient did relay this to me. He also did obtain and have the ultrasound of the wound completed which I did sign off on today. It does appear that there is no fluid collection under the muscle this is likely then just edematous tissue in general. That is also good news. Overall I still believe the inflammation is the main issue here. He did inquire about the possibility of a wound VAC again with the muscle protruding like it is I am not really sure whether the wound VAC is necessarily ideal or not. That is something we will have to consider  although I do believe he may need compression wrapping to try to help with edema control which could potentially be of benefit. 06/28/2019 on evaluation today patient appears to be doing slightly better measurement wise although this is not terribly smaller he least seems to be trending towards that direction. With that being said he still seems to have purulent drainage noted in the wound bed at this time. He has been on Levaquin followed by Cipro over the past month. Unfortunately he still seems to have some issues with active infection at this time. I did perform a culture last week in order to evaluate and see if indeed there was still anything going on. Subsequently the culture did come back showing Pseudomonas which is consistent with the drainage has been having which is blue-green in color. He also has had an odor that again was somewhat consistent with Pseudomonas as well. Long story short it appears that the culture showed an intermediate finding with regard to how well the Cipro will work for the Pseudomonas infection. Subsequently being that he does not seem to be clearing up and at best what we are doing is just keeping this at [place] I think he may need to see infectious disease to discuss IV antibiotic options. [name], [name] ([id number]) 07/05/2019 upon evaluation today patient appears to be doing okay in regard to his leg ulcer. He has been tolerating the dressing changes at this point without complication. Fortunately there is no signs of active infection at this time which is good news. No fevers, chills, nausea, vomiting, or diarrhea. With that being said he does have an appointment with infectious disease tomorrow and his primary care on Wednesday. Again the reason for the infectious disease referral was due to the fact that he did not seem to be fully resolving with the use of oral antibiotics and therefore we were thinking that IV antibiotic therapy may be necessary secondary to the  fact that there was an intermediate finding for how effective the Cipro may be. Nonetheless again he has been having a lot of purulent and even green drainage. Fortunately right now that seems to have calmed down over the past week with the reinitiation of the oral antibiotic. Nonetheless we will see what Dr. Megan Salon has to say. 07/12/2019 upon evaluation today  patient appears to be doing about the same at this point in regard to his left lower extremity ulcer. Fortunately there is no signs of active infection at this time which is good news I do believe the Levaquin has been beneficial I did review Dr. Hale Bogus note and to be honest I agree that the patient's leg does appear to be doing better currently. What we found in the past as he does not seem to really completely resolve he will stop the antibiotic and then subsequently things will revert back to having issues with blue-green drainage, increased pain, and overall worsening in general. Obviously that is the reason I sent him back to infectious disease. 07/19/2019 upon evaluation today patient appears to be doing roughly the same in size there is really no dramatic improvement. He has started back on the Levaquin at this point and though he seems to be doing okay he did still have a lot of blue/green drainage noted on evaluation today unfortunately. I think that this is still indicative more likely of a Pseudomonas infection as previously noted and again he does see Dr. Megan Salon in just a couple of days. I do not know that were really able to effectively clear this with just oral antibiotics alone based on what I am seeing currently. Nonetheless we are still continue to try to manage as best we can with regard to the patient and his wound. I do think the wrap was helpful in decreasing the edema which is excellent news. No fevers, chills, nausea, vomiting, or diarrhea. 07/26/2019 upon evaluation today patient appears to be doing slightly better with  regard to the overall appearance of the muscle there is no dark discoloration centrally. Fortunately there is no signs of active infection at this time. No fevers, chills, nausea, vomiting, or diarrhea. Patient's wound bed currently the patient did have an appointment with Dr. Megan Salon at infectious disease last week. With that being said Dr. Megan Salon the patient states was still somewhat hesitant about put him on any IV antibiotics he wanted [place] to repeat cultures today and then see where things go going forward. He does look like Dr. Megan Salon because of some improvement the patient did have with the Levaquin wanted [place] to see about repeating cultures. If it indeed grows the Pseudomonas again then he recommended a possibility of considering a PICC line placement and IV antibiotic therapy. He plans to see the patient back in 1 to 2 weeks. 08/02/2019 upon evaluation today patient appears to be doing poorly with regard to his left lower extremity. We did get the results of his culture back it shows that he is still showing evidence of Pseudomonas which is consistent with the purulent/blue-green drainage that he has currently. Subsequently the culture also shows that he now is showing resistance to the oral fluoroquinolones which is unfortunate as that was really the only thing to treat the infection prior. I do believe that he is looking like this is going require IV antibiotic therapy to get this under control. Fortunately there is no signs of systemic infection at this time which is good news. The patient does see Dr. Megan Salon tomorrow. 08/09/2019 upon evaluation today patient appears to be doing better with regard to his left lower extremity ulcer in regard to the overall appearance. He is currently on IV antibiotic therapy. As ordered by Dr. Megan Salon. Currently the patient is on ceftazidime which she is going to take for the next 2 weeks and then follow-up for 4 to 5-week  appointment with Dr. Megan Salon.  The patient started this this past Friday symptoms have not for a total of 3 days currently in full. 08/16/2019 upon evaluation today patient's wound actually does show muscle in the base of the wound but in general does appear to be much better as far as the overall evidence of infection is concerned. In fact I feel like this is for the most part cleared up he still on the IV antibiotics he has not completed the full course yet but I think he is doing much better which is excellent news. 08/23/2019 upon evaluation today patient appears to be doing about the same with regard to his wound at this point. He tells me that he still has pain unfortunately. Fortunately there is no evidence of systemic infection at this time which is great news. There is significant muscle protrusion. 09/13/19 upon evaluation today patient appears to be doing about the same in regard to his leg unfortunately. He still has a lot of drainage coming from the ulceration there is still muscle exposed. With that being said the patient's last wound culture still showed an intermediate finding with regard to the Pseudomonas he still having the bluish/green drainage as well. Overall I do not know that the wound has completely cleared of infection at this point. Fortunately there is no signs of active infection systemically at this point which is good news. 09/20/2019 upon evaluation today patient's wound actually appears to be doing about the same based on what I am seeing currently. I do not see any signs of systemic infection he still does have evidence of some local infection and drainage. He did see Dr. Megan Salon last week and Dr. Megan Salon states that he probably does need a different IV antibiotic although he does not want to put him on this until the patient begins the Remicade infusion which is actually scheduled for about 10 days out from today on 13 May. Following that time Dr. Megan Salon is good to see him back and then will  evaluate the feasibility of starting him on the IV antibiotic therapy once again at that point. I do not disagree with this plan I do believe as Dr. Megan Salon stated in his note that I reviewed today that the patient's issue is multifactorial with the pyoderma being 1 aspect of this that were hoping the Remicade will be helpful for her. In the meantime I think the gentamicin is, helping to keep things under decent okay control in regard to the ulcer. 09/27/2019 upon evaluation today patient appears to be doing about the same with regard to his wound still there is a lot of muscle exposure though he does have some hyper granulation tissue noted around the edge and actually some granulation tissue starting to form over the muscle which is actually good news. Fortunately there is no evidence of active infection which is also good news. His pain is less at this point. 5/21; this is a patient I have not seen in a long time. 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 He has a large wound on the left lateral leg with protruding muscle. He comes in the clinic today showing the same area on his left medial ankle. He says there is been a spot there for some time although we have not previously defined this. Today he has a clearly defined area with slight amount of skin breakdown surrounded by raised areas with a purplish hue in color. This is not painful he  says it is irritated. 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 He still has muscle protruding from the lateral portion of his left leg but fortunately the new area that they were concerned about at his last visit does not appear to have opened at this point. He is currently on Remicade infusions and seems to be doing better in my opinion in fact the wound itself seems to be overall much better. The purplish  discoloration that he did have seems to have resolved and I think that is a good sign that hopefully the Remicade is doing its job. He does have some biofilm noted over the surface of the wound. 11/01/2019 on evaluation today patient's wound actually appears to be doing excellent at this time. Fortunately there is no evidence of active infection and overall I feel like he is making great progress. The Remicade seems to be due excellent job in my opinion. KIING, DEAKIN ([id number]) 11/08/19 evaluation today vision actually appears to be doing quite well with regard to his weight ulcer. He's been tolerating dressing changes without complication. Fortunately there is no evidence of infection. No fevers, chills, nausea, or vomiting noted at this time. Overall states that is having more itching than pain which is actually a good sign in my opinion. 12/13/2019 upon evaluation today patient appears to be doing well today with regard to his wound. He has been tolerating the dressing changes without complication. Fortunately there is no sign of active infection at this time. No fevers, chills, nausea, vomiting, or diarrhea. Overall I feel like the infusion therapy has been very beneficial for him. 01/06/2020 on evaluation today patient appears to be doing well with regard to his wound. This is measuring smaller and actually looks to be doing better. Fortunately there is no signs of active infection at this point. No fevers, chills, nausea, vomiting, or diarrhea. With that being said he does still have the blue-green drainage but this does not seem to be causing any significant issues currently. He has been using the gentamicin that does seem to be keeping things under decent control at this point. He goes later this morning for his next infusion therapy for the pyoderma which seems to also be very beneficial. 02/07/2020 on evaluation today patient appears to be doing about the same in regard to his wounds  currently. Fortunately there is no signs of active infection systemically he does still have evidence of local infection still using gentamicin. He also is showing some signs of improvement albeit slowly I do feel like we are making some progress here. 02/21/2020 upon evaluation today patient appears to be making some signs of improvement the wound is measuring a little bit smaller which is great news and overall I am very pleased with where he stands currently. He is going to be having infusion therapy treatment on the 15th of this month. Fortunately there is no signs of active infection at this time. 03/13/2020 I do believe patient's wound is actually showing some signs of improvement here which is great news. He has continue with the infusion therapy through rheumatology/dermatology at [facility]. That does seem to be beneficial. I still think he gets as much benefit from this as he did from the prednisone initially but nonetheless obviously this is less harsh on his body that the prednisone as far as they are concerned. 03/31/2020 on evaluation today patient's wound actually showing signs of some pretty  good improvement in regard to the overall appearance of the wound bed. There is still muscle exposed though he does have some epithelial growth around the edges of the wound. Fortunately there is no signs of active infection at this time. No fevers, chills, nausea, vomiting, or diarrhea. 04/24/2020 upon evaluation today patient appears to be doing about the same in regard to his leg ulcer. He has been tolerating the dressing changes without complication. Fortunately there is no signs of active infection at this time. No fevers, chills, nausea, vomiting, or diarrhea. With that being said he still has a lot of irritation from the bandaging around the edges of the wound. We did discuss today the possibility of a referral to plastic surgery. 05/22/2020 on evaluation today patient appears to be doing well with  regard to his wounds all things considered. He has not been able to get the Chantix apparently there is a recall nurse that I was unaware of put out by [employer] involuntarily. Nonetheless for now I am and I have to do some research into what may be the best option for him to help with quitting in regard to smoking and we discussed that today. 06/26/2020 upon evaluation today patient appears to be doing well with regard to his wound from the standpoint of infection I do not see any signs of infection at this point. With that being said unfortunately he is still continuing to have issues with muscle exposure and again he is not having a whole lot of new skin growth unfortunately. There does not appear to be any signs of active infection at this time. No fevers, chills, nausea, vomiting, or diarrhea. 07/10/2020 upon evaluation today patient appears to be doing a little bit more poorly currently compared to where he was previous. I am concerned currently about an active infection that may be getting worse especially in light of the increased size and tenderness of the wound bed. No fevers, chills, nausea, vomiting, or diarrhea. 07/24/2020 upon evaluation today patient appears to be doing poorly in regard to his leg ulcer. He has been tolerating the dressing changes without complication but unfortunately is having a lot of discomfort. 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 Subsequently I think he is going require possibly IV antibiotics to get this under control. I am very concerned about the severity of his infection and the amount of discomfort he is having. Electronic Signature(s) Signed: 07/24/2020 8:41:53 AM By: Worthy Keeler PA-C Entered By: Worthy Keeler on 07/24/2020 08:41:53 DORA, CLAUSS ([id number]) -------------------------------------------------------------------------------- Physical Exam Details Patient Name: [name] Kerns Date of Service: 07/24/2020 8:00 AM Medical Record Number: [id number] Patient Account Number: [id number] Date of Birth/Sex: 07-25-1980 (39 y.o. M) Treating RN: Carlene Coria Primary Care Provider: [name] Other Clinician: Jeanine Luz Referring Provider: [name] Treating Provider/Extender: [name] Cliche in Treatment: 16 Constitutional Well-nourished and well-hydrated in no acute distress. Respiratory normal breathing without difficulty. Psychiatric this patient is able to make decisions and demonstrates good insight into disease process. Alert and Oriented x 3. pleasant and cooperative. Notes Patient's wound bed actually showed signs of being severely infected upon evaluation today. Unfortunately do not think he is making much progress here and I think a big portion of this is the issue with the infection. Subsequently I discussed with him that I do believe IV antibiotics are probably appropriate he does see Dr. Megan Salon tomorrow we will see with Dr. Megan Salon recommends at that point.  Electronic Signature(s) Signed: 07/24/2020 8:42:23 AM By: Worthy Keeler PA-C Entered By: Worthy Keeler on 07/24/2020 08:42:23 EPIFANIO, LABRADOR ([id number]) -------------------------------------------------------------------------------- Physician Orders Details Patient Name: [name] Kerns Date of Service: 07/24/2020 8:00 AM Medical Record Number: [id number] Patient Account Number: [id number] Date of Birth/Sex: August 27, 1980 (39 y.o. 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 Edema Control - Lymphedema / Segmental Compressive Device / Other o Tubigrip double layer applied - F Wound Treatment Wound #1 - Lower Leg Wound Laterality: Left, Lateral Cleanser: Soap and Water 1 x Per Day/30 Days Discharge Instructions: Gently cleanse wound with antibacterial soap, rinse and pat dry prior to dressing wounds Primary Dressing: [place] Dressing, 4x4 (in/in) 1 x Per Day/30 Days Secondary Dressing: Mepilex Border Flex, 6x6 (in/in) 1 x Per Day/30 Days Discharge Instructions: Apply to wound as directed. Do not cut. Consults o Infectious Disease - July 25, 2020 with Dr. Megan Salon Patient Medications Allergies: milk, Biaxin, seasonal Notifications Medication Indication Start End Santyl 07/24/2020 DOSE topical 250 unit/gram ointment - ointment topical Apply nickel thick daily to the wound bed and then cover with a dressing as directed in clinic x 30 days Electronic Signature(s) Signed: 07/24/2020 9:30:29 AM By: [name], [name], RN, [name], [name], [name] Signed: 07/24/2020 12:29:11 PM By: Worthy Keeler PA-C Previous Signature: 07/24/2020 9:18:12 AM Version By: Worthy Keeler PA-C Entered By: [name], [name], RN, [name], [name] on 07/24/2020 09:30:28 [name], [name] ([id number]) -------------------------------------------------------------------------------- Problem List Details Patient Name: [name] Kerns Date of Service: 07/24/2020 8:00 AM Medical Record Number: [id number] Patient Account Number: [id number] Date of Birth/Sex: 12-23-1980 (39 y.o. M) Treating RN: Carlene Coria Primary Care Provider: [name] Other Clinician: Jeanine Luz Referring Provider: [name] Treating Provider/Extender: [name] Cliche in Treatment: [phone] Active  Problems ICD-10 Encounter Code Description Active Date MDM Diagnosis L97.222 Non-pressure chronic ulcer of left calf with fat layer exposed 12/04/2016 No Yes L88 Pyoderma gangrenosum 03/26/2017 No Yes L97.321 Non-pressure chronic ulcer of left ankle limited to breakdown of skin 10/08/2019 No Yes I87.2 Venous insufficiency (chronic) (peripheral) 12/04/2016 No Yes L03.116 Cellulitis of left lower limb 05/24/2019 No Yes F17.208 Nicotine dependence, unspecified, with other nicotine-induced disorders 04/24/2020 No Yes Inactive Problems ICD-10 Code Description Active Date Inactive Date L97.213 Non-pressure chronic ulcer of right calf with necrosis of muscle 04/02/2017 04/02/2017 Resolved Problems Electronic Signature(s) Signed: 07/24/2020 8:22:48 AM By: Worthy Keeler PA-C Entered By: Worthy Keeler on 07/24/2020 08:22:48 [name] Kerns ([id number]) -------------------------------------------------------------------------------- Progress Note Details Patient Name: [name] Kerns Date of Service: 07/24/2020 8:00 AM Medical Record Number: [id number] Patient Account Number: [id number] Date of Birth/Sex: 06/04/1980 (39 y.o. M) Treating RN: Carlene Coria Primary Care Provider: [name] Other Clinician: Jeanine Luz Referring Provider: [name] Treating Provider/Extender: [name] Cliche in Treatment: 189 Subjective Chief Complaint Information obtained from Patient He is here in follow up evaluation for LLE pyoderma ulcer History of Present Illness (HPI) 12/04/16; 40 year old man who comes into the clinic today for review of a wound on the posterior left calf. He  tells me that is been there for about a year. He is not a diabetic he does smoke half a pack per day. He was seen in the ER on 11/20/16 felt to have cellulitis around the wound and was given clindamycin. An x-ray did not show osteomyelitis. The patient initially tells me that he has a milk allergy that sets off a pruritic  itching rash on his lower legs which she scratches incessantly and he thinks that's what may have set up the wound. He has been using various topical antibiotics and ointments without any effect. He works in a [employer] and is on his feet all day. He does not have a prior history of wounds however he does have the rash on both lower legs the right arm and the ventral aspect of his left arm. These are excoriations and clearly have had scratching however there are of macular looking areas on both legs including a substantial larger area on the right leg. This does not have an underlying open area. There is no blistering. The patient tells me that 2 years ago in [place] in response to the rash on his legs he saw a dermatologist who told him he had a condition which may be pyoderma gangrenosum although I may be putting words into his mouth. He seemed to recognize this. 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 He is not in any treatment for this. He's had no recent travel 12/11/16; the patient arrives today with his wound and roughly the same condition we've been using silver alginate this is a deep punched out wound with some surrounding erythema but no tenderness. Biopsy I did did not show confirmed pyoderma gangrenosum suggested nonspecific inflammation and vasculitis but does not provide an actual description of what was seen by the pathologist. I'm really not able to understand this We have also received information from the patient's dermatologist in [place] notes from April 2016. This was a doctor Agarwal-antal. 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 He was prescribed topical steroid high potency under occlusion which helped but at this point the patient did not have a deep punched out wound. 12/18/16; the patient's wound is larger in terms of surface area however this surface looks better and there is less depth. The surrounding  erythema also is better. The patient states that the wrap we put on came off 2 days ago when he has been using his compression stockings. He we are in the process of getting a dermatology consult. 12/26/16 on evaluation today patient's left lower extremity wound shows evidence of infection with surrounding erythema noted. He has been tolerating the dressing changes but states that he has noted more discomfort. There is a larger area of erythema surrounding the wound. No fevers, chills, nausea, or vomiting noted at this time. With that being said the wound still does have slough covering the surface. He is not allergic to any medication that he is aware of at this point. In regard to his right lower extremity he had several regions that are erythematous and pruritic he wonders if there's anything we can do to help that. 01/02/17 I reviewed patient's wound culture which was obtained his visit last week. He was placed on doxycycline at that point. Unfortunately that does not appear to be an antibiotic that would likely help with the situation however the pseudomonas noted on culture is sensitive to Cipro. Also unfortunately patient's wound seems to have a large  compared to last week's evaluation. Not severely so but there are definitely increased measurements in general. He is continuing to have discomfort as well he writes this to be a seven out of 10. In fact he would prefer me not to perform any debridement today due to the fact that he is having discomfort and considering he has an active infection on the little reluctant to do so anyway. No fevers, chills, nausea, or vomiting noted at this time. 01/08/17; patient seems dermatology on September 5. I suspect dermatology will want the slides from the biopsy I did sent to their pathologist. I'm not sure if there is a way we can expedite that. In any case the culture I did before I left on vacation 3 weeks ago showed Pseudomonas he was given 10 days of  Cipro and per her description of her intake nurses is actually somewhat better this week although the wound is quite a bit bigger than I remember the last time I saw this. He still has 3 more days of Cipro 01/21/17; dermatology appointment tomorrow. He has completed the ciprofloxacin for Pseudomonas. Surface of the wound looks better however he is had some deterioration in the lesions on his right leg. Meantime the left lateral leg wound we will continue with sample 01/29/17; patient had his dermatology appointment but I can't yet see that note. He is completed his antibiotics. The wound is more superficial but considerably larger in circumferential area than when he came in. This is in his left lateral calf. He also has swollen erythematous areas with superficial wounds on the right leg and small papular areas on both arms. There apparently areas in her his upper thighs and buttocks I did not look at those. Dermatology biopsied the right leg. Hopefully will have their input next week. 02/05/17; patient went back to see his dermatologist who told him that he had a "scratching problem" as well as staph. He is now on a 30 day course of doxycycline and I believe she gave him triamcinolone cream to the right leg areas to help with the itching [not exactly sure but probably triamcinolone]. She apparently looked at the left lateral leg wound although this was not rebiopsied and I think felt to be ultimately part of the same pathogenesis. He is using sample border foam and changing nevus himself. He now has a new open area on the right posterior leg which was his biopsy site I don't have any of the dermatology notes 02/12/17; we put the patient in compression last week with SANTYL to the wound on the left leg and the biopsy. Edema is much better and the depth of the wound is now at level of skin. Area is still the same Biopsy site on the right lateral leg we've also been using santyl with a border foam dressing  and he is changing this himself. 02/19/17; Using silver alginate started last week to both the substantial left leg wound and the biopsy site on the right wound. He is tolerating compression well. Has a an appointment with his primary M.D. tomorrow wondering about diuretics although I'm wondering if the edema problem is actually lymphedema [name], [name] ([id number]) 02/26/17; the patient has been to see his primary doctor Dr. Jerrel Ivory at [facility] our primary care. She started him on Lasix 20 mg and this seems to have helped with the edema. However we are not making substantial change with the left lateral calf wound and inflammation. The biopsy site on the right leg also  looks stable but not really all that different. 03/12/17; the patient has been to see vein and vascular Dr. Lucky Cowboy. He has had venous reflux studies I have not reviewed these. I did get a call from his dermatology office. 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 They therefore recommended clobetasol and tetracycline i.e. 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 03/26/17; apparently the patient just had reflux studies not an appointment with Dr. dew. She arrives in clinic today having applied clobetasol for 2-3 weeks. He notes over the last 2-3 days excessive drainage having to change the dressing 3-4 times a day and also expanding erythema. 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 Lillia Carmel at [facility]  dermatology locally who kindly saw him at my request. 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 My concern was the degree of erythema medially extending from his left leg wound which was either inflammation from pyoderma or cellulitis. I put him on Augmentin however culture of the wound showed Pseudomonas which is quinolone sensitive. I really don't believe he has cellulitis however in view of everything I will continue and give him a course of Cipro. He is also on doxycycline as an immune modulator for the pyoderma. In addition to his original wound on the left lateral leg with surrounding erythema he has a wound on the right posterior calf which was an original biopsy site done by dermatology. 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 Saw Dr. Lillia Carmel yesterday. He has been using topical antibiotics to both wound areas his original wound on the left and the biopsies/pathergy area on the right. There is definitely some improvement in the inflammation around the wound on the right although the patient states he has increasing sensitivity of the wounds. He is on prednisone 60 and doxycycline 1 as prescribed by Dr. Lillia Carmel. He is covering the topical antibiotic with gauze and putting this in his own compression stocks and changing this daily. He states that Dr. Lottie Rater did a culture of the left leg wound yesterday 05/07/17; pyoderma gangrenosum. The patient saw Dr. Lillia Carmel yesterday and has a follow-up with her in one month. He is still using topical antibiotics to both wounds although he can't recall exactly what type. He is still on prednisone 60 mg. Dr. Lillia Carmel stated that the doxycycline could stop if we were in agreement. 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  The right medial leg was induced by biopsy/pathergy. The area on the right is essentially healed. 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 The right medial leg has closed and remains closed. He is still on prednisone 60. He tells me he missed his last dermatology appointment with Dr. Lillia Carmel but will make another appointment. He reports  that her blood sugar at a recent screen in [place] was high 200's. He was 180 today. 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 I think he is going to require still much longer prednisone perhaps another 3 months before attempting to taper. In the meantime his wound is a lot better. Smaller. He is cleaning this off daily and applying topical antibiotics. When he was last in the clinic I thought about changing to [facility] and actually put in a couple of calls to dermatology although probably not during their business hours. 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 He continues on oral prednisone. He has been using triple antibiotic ointment. There is surface debris and we will transition to [facility] and have him return in 2 weeks. He has lost 30 pounds since his last appointment with lifestyle modification. He may benefit from topical steroid cream for treatment this can be considered at a later date. 08/22/17 on evaluation today patient appears to actually be doing rather well in regard to his left lateral lower extremity ulcer. He has actually been managed by Dr. Dellia Nims most recently. Patient is currently on oral steroids at this time. This seems to have been of benefit for him. Nonetheless his last visit was actually with Leah on 08/06/17. Currently he is not utilizing any topical steroid creams although this could be of benefit as well.  No fevers, chills, nausea, or vomiting noted at this time. 09/05/17 on evaluation today patient appears to be doing better in regard to his left lateral lower extremity ulcer. He has been tolerating the dressing changes without complication. He is using Santyl with good effect. Overall I'm very pleased with how things are standing at this point. Patient likewise is happy that this is doing better. 09/19/17 on evaluation today patient actually appears to be doing rather well in regard to his left lateral lower extremity ulcer. 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 He's not having any significant pain. 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 He has been tolerating the Santyl without complication and in general I feel like he's making good progress. 10/17/17 on evaluation today patient appears to be doing very well in regard to his left lateral lower surety ulcer. He has been tolerating the dressing changes without complication. There does not appear to be any evidence of infection he's alternating the Santyl and the triple antibiotic ointment every other day this seems to be doing well for him. 11/03/17 on evaluation today patient appears to be doing very well in regard to his left lateral lower extremity ulcer. He is been tolerating the dressing changes without complication which is good news. Fortunately there does not appear to be any evidence of infection which is also great news. Overall is doing excellent they are starting to taper down on the prednisone is down to 40 mg at this point it also started topical clobetasol for him. 11/17/17 on evaluation today patient appears to be doing well in regard to his left lateral lower surety ulcer. He's been tolerating the dressing changes without complication. He does  note that he is having no pain, no excessive drainage or discharge, and overall  he feels like things are going about how he would expect and hope they would. Overall he seems to have no evidence of infection at this time in my opinion which is good news. 12/04/17-He is seen in follow-up evaluation for right lateral lower extremity ulcer. He has been applying topical steroid cream. Today's measurement show slight increase in size. Over the next 2 weeks we will transition to every other day Santyl and steroid cream. He has been encouraged to monitor for changes and notify clinic with any concerns 12/15/17 on evaluation today patient's left lateral motion the ulcer and fortunately is doing worse again at this point. This just since last week to this week has close to doubled in size according to the patient. I did not seeing last week's I do not have a visual to compare this to in our system was also down so we do not have all the charts and at this point. Nonetheless it does have me somewhat concerned in regard to the fact that again he was worried enough about it he has contact the dermatology that placed them back on the full strength, 50 mg a day of the prednisone that he was taken previous. He continues to alternate using clobetasol along with Santyl at this point. He is obviously somewhat frustrated. [name], [name] ([id number]) 12/22/17 on evaluation today patient appears to be doing a little worse compared to last evaluation. Unfortunately the wound is a little deeper and slightly larger than the last week's evaluation. With that being said he has made some progress in regard to the irritation surrounding at this time unfortunately despite that progress that's been made he still has a significant issue going on here. I'm not certain that he is having really any true infection at this time although with the Pyoderma gangrenosum it can sometimes be difficult to differentiate infection versus just  inflammation. For that reason I discussed with him today the possibility of perform a wound culture to ensure there's nothing overtly infected. 01/06/18 on evaluation today patient's wound is larger and deeper than previously evaluated. With that being said it did appear that his wound was infected after my last evaluation with him. Subsequently I did end up prescribing a prescription for Bactrim DS which she has been taking and having no complication with. Fortunately there does not appear to be any evidence of infection at this point in time as far as anything spreading, no want to touch, and overall I feel like things are showing signs of improvement. 01/13/18 on evaluation today patient appears to be even a little larger and deeper than last time. There still muscle exposed in the base of the wound. Nonetheless he does appear to be less erythematous I do believe inflammation is calming down also believe the infection looks like it's probably resolved at this time based on what I'm seeing. No fevers, chills, nausea, or vomiting noted at this time. 01/30/18 on evaluation today patient actually appears to visually look better for the most part. Unfortunately those visually this looks better he does seem to potentially have what may be an abscess in the muscle that has been noted in the central portion of the wound. This is the first time that I have noted what appears to be fluctuance in the central portion of the muscle. With that being said I'm somewhat more concerned about the fact that this might indicate an abscess formation at this location. I do believe that an ultrasound would be appropriate. This is likely  something we need to try to do as soon as possible. He has been switch to mupirocin ointment and he is no longer using the steroid ointment as prescribed by dermatology he sees them again next week he's been decreased from 60 to 40 mg of prednisone. 03/09/18 on evaluation today patient  actually appears to be doing a little better compared to last time I saw him. There's not as much erythema surrounding the wound itself. He I did review his most recent infectious disease note which was dated 02/24/18. He saw Dr. Michel Bickers in [place]. 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 Patient is now off of antibiotics and they are continually observing this. There seems to be no change in the past two weeks in my pinion based on what the patient says and what I see today compared to what Dr. Megan Salon likely saw two weeks ago. No fevers, chills, nausea, or vomiting noted at this time. 03/23/18 on evaluation today patient's wound actually appears to be showing signs of improvement which is good news. He is currently still on the Dapsone. He is also working on tapering the prednisone to get off of this and Dr. Lottie Rater is working with him in this regard. Nonetheless overall I feel like the wound is doing well it does appear based on the infectious disease note that I reviewed from Dr. 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 I did also review the results of his ultrasound of left lower extremity which revealed there was a dentist tissue in the base of the wound without an abscess noted. 04/06/18 on evaluation today the patient's left lateral lower extremity ulcer actually appears to be doing fairly well which is excellent news. There does not appear to be any evidence of infection at this time which is also great news. Overall he still does have a significantly large ulceration although little by little he seems to be making progress. He is down to 10 mg a day of the prednisone. 04/20/18 on evaluation today patient actually appears to be doing excellent at this time in regard to his left  lower extremity ulcer. He's making signs of good progress unfortunately this is taking much longer than we would really like to see but nonetheless he is making progress. Fortunately there does not appear to be any evidence of infection at this time. No fevers, chills, nausea, or vomiting noted at this time. The patient has not been using the Santyl due to the cost he hadn't got in this field yet. He's mainly been using the antibiotic ointment topically. Subsequently he also tells me that he really has not been scrubbing in the shower I think this would be helpful again as I told him it doesn't have to be anything too aggressive to even make it believe just enough to keep it free of some of the loose slough and biofilm on the wound surface. 05/11/18 on evaluation today patient's wound appears to be making slow but sure progress in regard to the left lateral lower extremity ulcer. He is been tolerating the dressing changes without complication. Fortunately there does not appear to be any evidence of infection at this time. He is still just using triple antibiotic ointment along with clobetasol occasionally over the area. He never got  the Santyl and really does not seem to intend to in my pinion. 06/01/18 on evaluation today patient appears to be doing a little better in regard to his left lateral lower extremity ulcer. He states that overall he does not feel like he is doing as well with the Dapsone as he did with the prednisone. Nonetheless he sees his dermatologist later today and is gonna talk to them about the possibility of going back on the prednisone. Overall again I believe that the wound would be better if you would utilize Santyl but he really does not seem to be interested in going back to the [place] at this point. He has been using triple antibiotic ointment. 06/15/18 on evaluation today patient's wound actually appears to be doing about the same at this point. Fortunately there is no signs of  infection at this time. He has made slight improvements although he continues to not really want to clean the wound bed at this point. He states that he just doesn't mess with it he doesn't want to cause any problems with everything else he has going on. He has been on medication, antibiotics as prescribed by his dermatologist, for a staff infection of his lower extremities which is really drying out now and looking much better he tells me. Fortunately there is no sign of overall infection. 06/29/18 on evaluation today patient appears to be doing well in regard to his left lateral lower surety ulcer all things considering. Fortunately his staff infection seems to be greatly improved compared to previous. He has no signs of infection and this is drying up quite nicely. He is still the doxycycline for this is no longer on cental, Dapsone, or any of the other medications. His dermatologist has recommended possibility of an infusion but right now he does not want to proceed with that. 07/13/18 on evaluation today patient appears to be doing about the same in regard to his left lateral lower surety ulcer. Fortunately there's no signs of infection at this time which is great news. Unfortunately he still builds up a significant amount of Slough/biofilm of the surface of the wound he still is not really cleaning this as he should be appropriately. Again I'm able to easily with saline and gauze remove the majority of this on the surface which if you would do this at home would likely be a dramatic improvement for him as far as getting the area to improve. Nonetheless overall I still feel like he is making progress is just very slow. I think Santyl will be of benefit for him as well. Still he has not gotten this as of this point. 07/27/18 on evaluation today patient actually appears to be doing little worse in regards of the erythema around the periwound region of the wound he also tells me that he's been having  more drainage currently compared to what he was experiencing last time I saw him. He states not quite as bad as what he had because this was infected previously but nonetheless is still appears to be doing poorly. Fortunately there is no evidence of systemic infection at this point. The patient tells me that he is not going to be able to afford the Santyl. He is still waiting to hear about the infusion therapy with his dermatologist. Apparently she wants an updated colonoscopy first. [name], [name] ([id number]) 08/10/18 on evaluation today patient appears to be doing better in regard to his left lateral lower extremity ulcer. Fortunately he is showing signs of improvement  in this regard he's actually been approved for Remicade infusion's as well although this has not been scheduled as of yet. Fortunately there's no signs of active infection at this time in regard to the wound although he is having some issues with infection of the right lower extremity is been seen as dermatologist for this. Fortunately they are definitely still working with him trying to keep things under control. 09/07/18 on evaluation today patient is actually doing rather well in regard to his left lateral lower extremity ulcer. He notes these actually having some hair grow back on his extremity which is something he has not seen in years. He also tells me that the pain is really not giving them any trouble at this time which is also good news overall she is very pleased with the progress he's using a combination of the mupirocin along with the probate is all mixed. 09/21/18 on evaluation today patient actually appears to be doing fairly well all things considered in regard to his looks from the ulcer. He's been tolerating the dressing changes without complication. Fortunately there's no signs of active infection at this time which is good news he is still on all antibiotics or prevention of the staff infection. He has been on  prednisone for time although he states it is gonna contact his dermatologist and see if she put them on a short course due to some irritation that he has going on currently. Fortunately there's no evidence of any overall worsening this is going very slow I think cental would be something that would be helpful for him although he states that $50 for tube is quite expensive. He therefore is not willing to get that at this point. 10/06/18 on evaluation today patient actually appears to be doing decently well in regard to his left lateral leg ulcer. He's been tolerating the dressing changes without complication. Fortunately there's no signs of active infection at this time. Overall I'm actually rather pleased with the progress he's making although it's slow he doesn't show any signs of infection and he does seem to be making some improvement. I do believe that he may need a switch up and dressings to try to help this to heal more appropriately and quickly. 10/19/18 on evaluation today patient actually appears to be doing better in regard to his left lateral lower extremity ulcer. This is shown signs of having much less Slough buildup at this point due to the fact he has been using the [employer]. Obviously this is very good news. The overall size of the wound is not dramatically smaller but again the appearance is. 11/02/18 on evaluation today patient actually appears to be doing quite well in regard to his lower [place] ulcer. A lot of the skin around the ulcer is actually somewhat irritating at this point this seems to be more due to the dressing causing irritation from the adhesive that anything else. Fortunately there is no signs of active infection at this time. 11/24/18 on evaluation today patient appears to be doing a little worse in regard to his overall appearance of his lower extremity ulcer. There's more erythema and warmth around the wound unfortunately. He is currently on doxycycline which he has been  on for some time. With that being said I'm not sure that seems to be helping with what appears to possibly be an acute cellulitis with regard to his left lower extremity ulcer. No fevers, chills, nausea, or vomiting noted at this time. 12/08/18 on evaluation today patient's  wounds actually appears to be doing significantly better compared to his last evaluation. He has been using Santyl along with alternating tripling about appointment as well as the steroid cream seems to be doing quite well and the wound is showing signs of improvement which is excellent news. Fortunately there's no evidence of infection and in fact his culture came back negative with only normal skin flora noted. 12/21/2018 upon evaluation today patient actually appears to be doing excellent with regard to his ulcer. This is actually the best that I have seen it since have been helping to take care of him. It is both smaller as well as less slough noted on the surface of the wound and seems to be showing signs of good improvement with new skin growing from the edges. He has been using just the triamcinolone he does wonder if he can get a refill of that ointment today. 01/04/2019 upon evaluation today patient actually appears to be doing well with regard to his left lateral lower extremity ulcer. With that being said it does not appear to be that he is doing quite as well as last time as far as progression is concerned. There does not appear to be any signs of infection or significant irritation which is good news. With that being said I do believe that he may benefit from switching to a collagen based dressing based on how clean The wound appears. 01/18/2019 on evaluation today patient actually appears to be doing well with regard to his wound on the left lower extremity. He is not made a lot of progress compared to where we were previous but nonetheless does seem to be doing okay at this time which is good news. There is no signs of  active infection which is also good news. My only concern currently is I do wish we can get him into utilizing the collagen dressing his insurance would not pay for the supplies that we ordered although it appears that he may be able to order this through his supply company that he typically utilizes. This is [employer]. Nonetheless he did try to order it during the office visit today and it appears this did go through. We will see if he can get that it is a different brand but nonetheless he has collagen and I do think will be beneficial. 02/01/2019 on evaluation today patient actually appears to be doing a little worse today in regard to the overall size of his wounds. Fortunately there is no signs of active infection at this time. That is visually. Nonetheless when this is happened before it was due to infection. For that reason were somewhat concerned about that this time as well. 02/08/2019 on evaluation today patient unfortunately appears to be doing slightly worse with regard to his wound upon evaluation today. Is measuring a little deeper and a little larger unfortunately. I am not really sure exactly what is causing this to enlarge he actually did see his dermatologist she is going to see about initiating Humira for him. Subsequently she also did do steroid injections into the wound itself in the periphery. Nonetheless still nonetheless he seems to be getting a little bit larger he is gone back to just using the steroid cream topically which I think is appropriate. I would say hold off on the collagen for the time being is definitely a good thing to do. Based on the culture results which we finally did get the final result back regarding it shows staph as the bacteria  noted again that can be a normal skin bacteria based on the fact however he is having increased drainage and worsening of the wound measurement wise I would go ahead and place him on an antibiotic today I do believe for  this. 02/15/2019 on evaluation today patient actually appears to be doing somewhat better in regard to his ulcer. There is no signs of worsening at this time I did review his culture results which showed evidence of Staphylococcus aureus but not MRSA. Again this could just be more related to the normal skin bacteria although he states the drainage has slowed down quite a bit he may have had a mild infection not just colonization. And was much smaller and then since around10/04/2019 on evaluation today patient appears to be doing unfortunately worse as far as the size of the wound. I really feel like that this is steadily getting larger again it had been doing excellent right at the beginning of September we have seen a steady increase in the area of the wound it is almost 2-1/2 times the size it was on September 1. Obviously this is a bad trend this is not wanting to see. For that reason we went back to using just the topical triamcinolone cream which does seem to help with inflammation. I checked him for bacteria by way of culture and nothing showed positive there. I am considering giving him a short course of a tapering steroid Kaeo Jacome, Jeremia ([id number]) today to see if that is can be beneficial for him. The patient is in agreement with giving that a try. 03/08/2019 on evaluation today patient appears to be doing very well in comparison to last evaluation with regard to his lower extremity ulcer. This is showing signs of less inflammation and actually measuring slightly smaller compared to last time every other week over the past month and a half he has been measuring larger larger larger. Nonetheless I do believe that the issue has been inflammation the prednisone does seem to have been beneficial for him which is good news. No fevers, chills, nausea, vomiting, or diarrhea. 03/22/2019 on evaluation today patient appears to be doing about the same with regard to his leg ulcer. He has been  tolerating the dressing changes without complication. With that being said the wound seems to be mostly arrested at its current size but really is not making any progress except for when we prescribed the prednisone. He did show some signs of dropping as far as the overall size of the wound during that interval week. Nonetheless this is something he is not on long-term at this point and unfortunately I think he is getting need either this or else the Humira which his dermatologist has discussed try to get approval for. With that being said he will be seeing his dermatologist on the 11th of this month that is November. 04/19/2019 on evaluation today patient appears to be doing really about the same the wound is measuring slightly larger compared to last time I saw him. He has not been into the office since November 2 due to the fact that he unfortunately had Covid as that his entire family. He tells me that it was rough but they did pull-through and he seems to be doing much better. Fortunately there is no signs of active infection at this time. No fevers, chills, nausea, vomiting, or diarrhea. 05/10/2019 on evaluation today patient unfortunately appears to be doing significantly worse as compared to last time I saw him. He does  tell me that he has had his first dose of Humira and actually is scheduled to get the next one in the upcoming week. With that being said he tells me also that in the past several days he has been having a lot of issues with green drainage she showed me a picture this is more blue-green in color. He is also been having issues with increased sloughy buildup and the wound does appear to be larger today. Obviously this is not the direction that we want everything to take based on the starting of his Humira. Nonetheless I think this is definitely a result of likely infection and to be honest I think this is probably Pseudomonas causing the infection based on what I am  seeing. 05/24/2019 on evaluation today patient unfortunately appears to be doing significantly worse compared to his prior evaluation with me 2 weeks ago. I did review his culture results which showed that he does have Staph aureus as well as Pseudomonas noted on the culture. Nonetheless the Levaquin that I prescribed for him does not appear to have been appropriate and in fact he tells me he is no longer experiencing the green drainage and discharge that he had at the last visit. Fortunately there is no signs of active infection at this time which is good news although the wound has significantly worsened it in fact is much deeper than it was previous. We have been utilizing up to this point triamcinolone ointment as the prescription topical of choice but at this time I really feel like that the wound is getting need to be packed in order to appropriately manage this due to the deeper nature of the wound. Therefore something along the lines of an alginate dressing may be more appropriate. 05/31/2019 upon inspection today patient's wound actually showed signs of doing poorly at this point. Unfortunately he just does not seem to be making any good progress despite what we have tried. He actually did go ahead and pick up the Cipro and start taking that as he was noticing more green drainage he had previously completed the Levaquin that I prescribed for him as well. Nonetheless he missed his appointment for the seventh last week on Wednesday with the wound care center and [facility] where his dermatologist referred him. Obviously I do think a second opinion would be helpful at this point especially in light of the fact that the patient seems to be doing so poorly despite the fact that we have tried everything that I really know how at this point. The only thing that ever seems to have helped him in the past is when he was on high doses of continual steroids that did seem to make a difference for him.  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 06/07/2019 upon evaluation today patient unfortunately appears to be doing worse yet again with regard to his wound. In fact I am starting to question whether or not he may have a fluid pocket in the muscle at this point based on the bulging and the soft appearance to the central portion of the muscle area. There is not anything draining from the muscle itself at this time which is good news but nonetheless the wound is expanding. I am not really seeing any results of the Humira as  far as overall wound progression based on what I am seeing at this point. The patient has been referred for second opinion with regard to his wound to the [facility] wound care center by his dermatologist which I definitely am not in opposition to. Unfortunately we tried multiple dressings in the past including collagen, alginate, and at one point even Hydrofera Blue. With that being said he is never really used it for any significant amount of time due to the fact that he often complains of pain associated with these dressings and then will go back to either using the Santyl which she has done intermittently or more frequently the triamcinolone. He is also using his own compression stockings. We have wrapped him in the past but again that was something else that he really was not a big fan of. 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 He seemed to do much better when he was on the steroids although honestly I  understand that the steroids are not necessarily the best medication to be on long-term obviously 06/14/2019 on evaluation today patient appears to be doing actually a little bit better with regard to the overall appearance with his leg. Unfortunately he does continue to have issues with what appears to be some fluid underneath the muscle although he did see the wound specialty center at [facility] last week their main goals were to see about infusion therapy in place of the Humira as they feel like that is not quite strong enough. They also recommended that we continue with the treatment otherwise as we are they felt like that was appropriate and they are okay with him continuing to follow-up here with [place] in that regard. With that being said they are also sending him to the vein specialist there to see about vein stripping and if that would be of benefit for him. Subsequently they also did not really address whether or not an ultrasound of the muscle area to see if there is anything that needs to be addressed here would be appropriate or not. For that reason I discussed this with him last week I think we may proceed down that road at this point. 06/21/2019 upon evaluation today patient's wound actually appears to be doing slightly better compared to previous evaluations. I do believe that he has made a difference with regard to the progression here with the use of oral steroids. Again in the past has been the only thing that is really calm things down. He does tell me that from [facility] is gotten a good news from there that there are no further vein stripping that is necessary at this point. I do not have that available for review today although the patient did relay this to me. He also did obtain and have the ultrasound of the wound completed which I did sign off on today. It does appear that there is no fluid collection under the muscle this is likely then just edematous tissue in general. That is also good news.  Overall I still believe the inflammation is the main issue here. He did inquire about the possibility of a wound VAC again with the  muscle protruding like it is I am not really sure whether the wound VAC is necessarily ideal or not. That is something we will have to consider although I do believe he may need compression wrapping to try to help with edema control which could potentially be of benefit. 06/28/2019 on evaluation today patient appears to be doing slightly better measurement wise although this is not terribly smaller he least seems to be trending towards that direction. With that being said he still seems to have purulent drainage noted in the wound bed at this time. He has been on Levaquin followed by Cipro over the past month. Unfortunately he still seems to have some issues with active infection at this time. I did perform a culture last week in order to evaluate and see if indeed there was still anything going on. Subsequently the culture did come back Quinlivan, Xane ([id number]) showing Pseudomonas which is consistent with the drainage has been having which is blue-green in color. He also has had an odor that again was somewhat consistent with Pseudomonas as well. Long story short it appears that the culture showed an intermediate finding with regard to how well the Cipro will work for the Pseudomonas infection. Subsequently being that he does not seem to be clearing up and at best what we are doing is just keeping this at [place] I think he may need to see infectious disease to discuss IV antibiotic options. 07/05/2019 upon evaluation today patient appears to be doing okay in regard to his leg ulcer. He has been tolerating the dressing changes at this point without complication. Fortunately there is no signs of active infection at this time which is good news. No fevers, chills, nausea, vomiting, or diarrhea. With that being said he does have an appointment with infectious disease tomorrow and  his primary care on Wednesday. Again the reason for the infectious disease referral was due to the fact that he did not seem to be fully resolving with the use of oral antibiotics and therefore we were thinking that IV antibiotic therapy may be necessary secondary to the fact that there was an intermediate finding for how effective the Cipro may be. Nonetheless again he has been having a lot of purulent and even green drainage. Fortunately right now that seems to have calmed down over the past week with the reinitiation of the oral antibiotic. Nonetheless we will see what Dr. Megan Salon has to say. 07/12/2019 upon evaluation today patient appears to be doing about the same at this point in regard to his left lower extremity ulcer. Fortunately there is no signs of active infection at this time which is good news I do believe the Levaquin has been beneficial I did review Dr. Hale Bogus note and to be honest I agree that the patient's leg does appear to be doing better currently. What we found in the past as he does not seem to really completely resolve he will stop the antibiotic and then subsequently things will revert back to having issues with blue-green drainage, increased pain, and overall worsening in general. Obviously that is the reason I sent him back to infectious disease. 07/19/2019 upon evaluation today patient appears to be doing roughly the same in size there is really no dramatic improvement. He has started back on the Levaquin at this point and though he seems to be doing okay he did still have a lot of blue/green drainage noted on evaluation today unfortunately. I think that this is still indicative more likely of  a Pseudomonas infection as previously noted and again he does see Dr. Megan Salon in just a couple of days. I do not know that were really able to effectively clear this with just oral antibiotics alone based on what I am seeing currently. Nonetheless we are still continue to try to  manage as best we can with regard to the patient and his wound. I do think the wrap was helpful in decreasing the edema which is excellent news. No fevers, chills, nausea, vomiting, or diarrhea. 07/26/2019 upon evaluation today patient appears to be doing slightly better with regard to the overall appearance of the muscle there is no dark discoloration centrally. Fortunately there is no signs of active infection at this time. No fevers, chills, nausea, vomiting, or diarrhea. Patient's wound bed currently the patient did have an appointment with Dr. Megan Salon at infectious disease last week. With that being said Dr. Megan Salon the patient states was still somewhat hesitant about put him on any IV antibiotics he wanted [place] to repeat cultures today and then see where things go going forward. He does look like Dr. Megan Salon because of some improvement the patient did have with the Levaquin wanted [place] to see about repeating cultures. If it indeed grows the Pseudomonas again then he recommended a possibility of considering a PICC line placement and IV antibiotic therapy. He plans to see the patient back in 1 to 2 weeks. 08/02/2019 upon evaluation today patient appears to be doing poorly with regard to his left lower extremity. We did get the results of his culture back it shows that he is still showing evidence of Pseudomonas which is consistent with the purulent/blue-green drainage that he has currently. Subsequently the culture also shows that he now is showing resistance to the oral fluoroquinolones which is unfortunate as that was really the only thing to treat the infection prior. I do believe that he is looking like this is going require IV antibiotic therapy to get this under control. Fortunately there is no signs of systemic infection at this time which is good news. The patient does see Dr. Megan Salon tomorrow. 08/09/2019 upon evaluation today patient appears to be doing better with regard to his left lower  extremity ulcer in regard to the overall appearance. He is currently on IV antibiotic therapy. As ordered by Dr. Megan Salon. Currently the patient is on ceftazidime which she is going to take for the next 2 weeks and then follow-up for 4 to 5-week appointment with Dr. Megan Salon. The patient started this this past Friday symptoms have not for a total of 3 days currently in full. 08/16/2019 upon evaluation today patient's wound actually does show muscle in the base of the wound but in general does appear to be much better as far as the overall evidence of infection is concerned. In fact I feel like this is for the most part cleared up he still on the IV antibiotics he has not completed the full course yet but I think he is doing much better which is excellent news. 08/23/2019 upon evaluation today patient appears to be doing about the same with regard to his wound at this point. He tells me that he still has pain unfortunately. Fortunately there is no evidence of systemic infection at this time which is great news. There is significant muscle protrusion. 09/13/19 upon evaluation today patient appears to be doing about the same in regard to his leg unfortunately. He still has a lot of drainage coming from the ulceration there is still  muscle exposed. With that being said the patient's last wound culture still showed an intermediate finding with regard to the Pseudomonas he still having the bluish/green drainage as well. Overall I do not know that the wound has completely cleared of infection at this point. Fortunately there is no signs of active infection systemically at this point which is good news. 09/20/2019 upon evaluation today patient's wound actually appears to be doing about the same based on what I am seeing currently. I do not see any signs of systemic infection he still does have evidence of some local infection and drainage. He did see Dr. Megan Salon last week and Dr. Megan Salon states that he probably  does need a different IV antibiotic although he does not want to put him on this until the patient begins the Remicade infusion which is actually scheduled for about 10 days out from today on 13 May. Following that time Dr. Megan Salon is good to see him back and then will evaluate the feasibility of starting him on the IV antibiotic therapy once again at that point. I do not disagree with this plan I do believe as Dr. Megan Salon stated in his note that I reviewed today that the patient's issue is multifactorial with the pyoderma being 1 aspect of this that were hoping the Remicade will be helpful for her. In the meantime I think the gentamicin is, helping to keep things under decent okay control in regard to the ulcer. 09/27/2019 upon evaluation today patient appears to be doing about the same with regard to his wound still there is a lot of muscle exposure though he does have some hyper granulation tissue noted around the edge and actually some granulation tissue starting to form over the muscle which is actually good news. Fortunately there is no evidence of active infection which is also good news. His pain is less at this point. 5/21; this is a patient I have not seen in a long time. 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 He has a large wound on the left lateral leg with protruding muscle. He comes in the clinic today showing the same area on his left medial ankle. He says there is been a spot there for some time although we have not previously defined this. Today he has a clearly defined area with slight amount of skin breakdown surrounded by raised areas with a purplish hue in color. This is not painful he says it is irritated. 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 He still has muscle protruding from the lateral portion of his left leg but  fortunately the new area that they were concerned about at his last visit does not appear to have opened at this point. He is currently on Remicade infusions and seems to be doing better in my opinion in fact the wound itself seems to be overall much better. The purplish discoloration that he did have seems to have resolved and I think that is a good sign that hopefully the Remicade is doing its job. He does Escher, Iseah ([id number]) have some biofilm noted over the surface of the wound. 11/01/2019 on evaluation today patient's wound actually appears to be doing excellent at this time. Fortunately there is no evidence of active infection and overall I feel like he is making great progress. The Remicade seems to be due excellent job in my opinion. 11/08/19 evaluation today vision  actually appears to be doing quite well with regard to his weight ulcer. He's been tolerating dressing changes without complication. Fortunately there is no evidence of infection. No fevers, chills, nausea, or vomiting noted at this time. Overall states that is having more itching than pain which is actually a good sign in my opinion. 12/13/2019 upon evaluation today patient appears to be doing well today with regard to his wound. He has been tolerating the dressing changes without complication. Fortunately there is no sign of active infection at this time. No fevers, chills, nausea, vomiting, or diarrhea. Overall I feel like the infusion therapy has been very beneficial for him. 01/06/2020 on evaluation today patient appears to be doing well with regard to his wound. This is measuring smaller and actually looks to be doing better. Fortunately there is no signs of active infection at this point. No fevers, chills, nausea, vomiting, or diarrhea. With that being said he does still have the blue-green drainage but this does not seem to be causing any significant issues currently. He has been using the gentamicin that does seem to be  keeping things under decent control at this point. He goes later this morning for his next infusion therapy for the pyoderma which seems to also be very beneficial. 02/07/2020 on evaluation today patient appears to be doing about the same in regard to his wounds currently. Fortunately there is no signs of active infection systemically he does still have evidence of local infection still using gentamicin. He also is showing some signs of improvement albeit slowly I do feel like we are making some progress here. 02/21/2020 upon evaluation today patient appears to be making some signs of improvement the wound is measuring a little bit smaller which is great news and overall I am very pleased with where he stands currently. He is going to be having infusion therapy treatment on the 15th of this month. Fortunately there is no signs of active infection at this time. 03/13/2020 I do believe patient's wound is actually showing some signs of improvement here which is great news. He has continue with the infusion therapy through rheumatology/dermatology at [facility]. That does seem to be beneficial. I still think he gets as much benefit from this as he did from the prednisone initially but nonetheless obviously this is less harsh on his body that the prednisone as far as they are concerned. 03/31/2020 on evaluation today patient's wound actually showing signs of some pretty good improvement in regard to the overall appearance of the wound bed. There is still muscle exposed though he does have some epithelial growth around the edges of the wound. Fortunately there is no signs of active infection at this time. No fevers, chills, nausea, vomiting, or diarrhea. 04/24/2020 upon evaluation today patient appears to be doing about the same in regard to his leg ulcer. He has been tolerating the dressing changes without complication. Fortunately there is no signs of active infection at this time. No fevers, chills, nausea,  vomiting, or diarrhea. With that being said he still has a lot of irritation from the bandaging around the edges of the wound. We did discuss today the possibility of a referral to plastic surgery. 05/22/2020 on evaluation today patient appears to be doing well with regard to his wounds all things considered. He has not been able to get the Chantix apparently there is a recall nurse that I was unaware of put out by [employer] involuntarily. Nonetheless for now I am and I have to do  some research into what may be the best option for him to help with quitting in regard to smoking and we discussed that today. 06/26/2020 upon evaluation today patient appears to be doing well with regard to his wound from the standpoint of infection I do not see any signs of infection at this point. With that being said unfortunately he is still continuing to have issues with muscle exposure and again he is not having a whole lot of new skin growth unfortunately. There does not appear to be any signs of active infection at this time. No fevers, chills, nausea, vomiting, or diarrhea. 07/10/2020 upon evaluation today patient appears to be doing a little bit more poorly currently compared to where he was previous. I am concerned currently about an active infection that may be getting worse especially in light of the increased size and tenderness of the wound bed. No fevers, chills, nausea, vomiting, or diarrhea. 07/24/2020 upon evaluation today patient appears to be doing poorly in regard to his leg ulcer. He has been tolerating the dressing changes without complication but unfortunately is having a lot of discomfort. 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 Subsequently I think he is going require possibly IV antibiotics to get this under control. I am very concerned about the severity of his infection and the amount of discomfort he is  having. Objective Constitutional Well-nourished and well-hydrated in no acute distress. Vitals Time Taken: 8:13 AM, Height: 71 in, Weight: 338 lbs, BMI: 47.1, Temperature: 98.5 F, Pulse: 94 bpm, Respiratory Rate: 18 breaths/min, Blood Pressure: 143/91 mmHg. Respiratory normal breathing without difficulty. [facility] ([id number]) this patient is able to make decisions and demonstrates good insight into disease process. Alert and Oriented x 3. pleasant and cooperative. General Notes: Patient's wound bed actually showed signs of being severely infected upon evaluation today. Unfortunately do not think he is making much progress here and I think a big portion of this is the issue with the infection. Subsequently I discussed with him that I do believe IV antibiotics are probably appropriate he does see Dr. Megan Salon tomorrow we will see with Dr. Megan Salon recommends at that point. Integumentary (Hair, Skin) Wound #1 status is Open. Original cause of wound was Gradually Appeared. The date acquired was: 11/18/2015. The wound has been in treatment 189 weeks. The wound is located on the Left,Lateral Lower Leg. The wound measures 6.5cm length x 6.5cm width x 1.1cm depth; 33.183cm^2 area and 36.501cm^3 volume. There is muscle and Fat Layer (Subcutaneous Tissue) exposed. There is no tunneling or undermining noted. There is a large amount of purulent drainage noted. The wound margin is epibole. There is medium (34-66%) red, pink granulation within the wound bed. There is a medium (34-66%) amount of necrotic tissue within the wound bed including Adherent Slough. 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 Consults ordered were: Infectious Disease - July 25, 2020 with Dr. Megan Salon The following medication(s) was prescribed: Santyl topical 250 unit/gram ointment ointment topical Apply nickel thick daily to the wound bed and then cover with a dressing as directed in clinic x 30 days starting 07/24/2020 WOUND #1: - Lower Leg Wound Laterality: Left, Lateral Cleanser: Soap and Water 1  x Per Day/30 Days Discharge Instructions: Gently cleanse wound with antibacterial soap, rinse and pat dry prior to dressing wounds Primary Dressing: [place] Dressing, 4x4 (in/in) 1 x Per Day/30 Days Secondary Dressing: Mepilex Border Flex, 6x6 (in/in) 1 x Per Day/30 Days Discharge Instructions: Apply to wound as directed. Do not cut. 1. Would recommend currently that we go and continue with wound care measures as before with regard to the Santyl I think discontinuing the gentamicin is appropriate there is really no reason to continue considering the fact that he is having such severe issue currently with the infection and the fact that it is resistant to the gentamicin anyway. For that reason we will just continue along the lines with the Santyl and daily dressing changes. 2. I would recommend double layer Tubigrip over top. We have tried compression wraps in the past but for multiple reasons that was not tolerated. 3. I am also going to go and send in a refill the Santyl for him today. We will see patient back for reevaluation in 1 week here in the clinic. If anything worsens or changes patient will contact our office for additional recommendations. Electronic Signature(s) Signed: 07/24/2020 9:18:43 AM By: Worthy Keeler PA-C Previous Signature: 07/24/2020 8:45:25 AM Version By: Worthy Keeler PA-C Entered By: Worthy Keeler on 07/24/2020 09:18:43 [name], [name]  ([id number]) -------------------------------------------------------------------------------- SuperBill Details Patient Name: [name] [place] Date of Service: 07/24/2020 Medical Record Number: [id number] Patient Account Number: [id number] Date of Birth/Sex: Jun 25, 1980 (39 y.o. M) Treating RN: Carlene Coria Primary Care Provider: [name] Other Clinician: Jeanine Luz Referring Provider: [name] Treating Provider/Extender: [name] Cliche in Treatment: 189 Diagnosis Coding ICD-10 Codes Code Description [phone] Non-pressure chronic ulcer of left calf with fat layer exposed L88 Pyoderma gangrenosum L97.321 Non-pressure chronic ulcer of left ankle limited to breakdown of skin I87.2 Venous insufficiency (chronic) (peripheral) L03.116 Cellulitis of left lower limb F17.208 Nicotine dependence, unspecified, with other nicotine-induced disorders Facility Procedures CPT4 Code: [id number] Description: 99213 - WOUND CARE VISIT-LEV 3 EST PT Modifier: Quantity: 1 Physician Procedures CPT4 Code: 8469629 Description: 52841 - WC PHYS LEVEL 4 - EST PT Modifier: Quantity: 1 CPT4 Code: Description: ICD-10 Diagnosis Description L97.222 Non-pressure chronic ulcer of left calf with fat layer exposed L88 Pyoderma gangrenosum L97.321 Non-pressure chronic ulcer of left ankle limited to breakdown of skin I87.2 Venous insufficiency (chronic)  (peripheral) Modifier: Quantity: Electronic Signature(s) Signed: 07/24/2020 9:19:11 AM By: Worthy Keeler PA-C Previous Signature: 07/24/2020 8:45:39 AM Version By: Worthy Keeler PA-C Entered By: Worthy Keeler on 07/24/2020 09:19:11

## 2020-07-25 ENCOUNTER — Ambulatory Visit (INDEPENDENT_AMBULATORY_CARE_PROVIDER_SITE_OTHER): Payer: 59 | Admitting: Internal Medicine

## 2020-07-25 ENCOUNTER — Encounter: Payer: Self-pay | Admitting: Internal Medicine

## 2020-07-25 ENCOUNTER — Telehealth: Payer: Self-pay

## 2020-07-25 DIAGNOSIS — A498 Other bacterial infections of unspecified site: Secondary | ICD-10-CM | POA: Insufficient documentation

## 2020-07-25 DIAGNOSIS — L0889 Other specified local infections of the skin and subcutaneous tissue: Secondary | ICD-10-CM

## 2020-07-25 DIAGNOSIS — L089 Local infection of the skin and subcutaneous tissue, unspecified: Secondary | ICD-10-CM

## 2020-07-25 DIAGNOSIS — Z1639 Resistance to other specified antimicrobial drug: Secondary | ICD-10-CM | POA: Diagnosis not present

## 2020-07-25 DIAGNOSIS — B965 Pseudomonas (aeruginosa) (mallei) (pseudomallei) as the cause of diseases classified elsewhere: Secondary | ICD-10-CM | POA: Diagnosis not present

## 2020-07-25 DIAGNOSIS — L97229 Non-pressure chronic ulcer of left calf with unspecified severity: Secondary | ICD-10-CM | POA: Diagnosis not present

## 2020-07-25 HISTORY — DX: Local infection of the skin and subcutaneous tissue, unspecified: L08.9

## 2020-07-25 HISTORY — DX: Other bacterial infections of unspecified site: A49.8

## 2020-07-25 MED ORDER — SODIUM CHLORIDE 0.9 % IV SOLN: 2.0000 g | Freq: Two times a day (BID) | INTRAVENOUS | Status: DC

## 2020-07-25 NOTE — Unmapped (Signed)
Message from plan -     This medication or product is on your plan's list of covered drugs. Prior authorization is not required at this time. If your pharmacy has questions regarding the processing of your prescription, please have them call the [facility] pharmacy help desk at [phone]. **Please note: This request was submitted electronically. Formulary lowering, tiering exception, cost reduction and/or pre-benefit determination review (including prospective Medicare hospice reviews) requests cannot be requested using this method of submission. Providers contact us at [phone] for further assistance.

## 2020-07-25 NOTE — Telephone Encounter (Signed)
New antibiotic orders per Dr. Devorah Givhan Salon. Patient scheduled for PICC placement 08/01/20 at 2 pm. Patient aware of appointment time and location and is agreeable. Patient knows he is to receive first dose in short stay.   RN faxed orders to [facility] with receipt of successful fax transmission. RN spoke to Debbie at [facility] to make her aware of new orders and to relay PICC appointment time and that patient will be receiving first dose in short stay. Debbie verbalized understanding.   RN faxed orders to [facility] short stay with receipt of successful fax transmission.   Beryle Flock, RN

## 2020-07-25 NOTE — Assessment & Plan Note (Signed)
It appears that [name] has developed some superficial wound infection with a more resistant Pseudomonas isolate.  He has previously received IV antibiotics for Pseudomonas infection.  He agrees to proceed with PICC placement and cefepime therapy.  He will follow-up here in 2 weeks.

## 2020-07-25 NOTE — Progress Notes (Signed)
[facility] for Infectious Disease  Patient Active Problem List   Diagnosis Date Noted  . Wound infection 07/25/2020    Priority: High  . 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 Essential hypertension 06/28/2020  . Normocytic anemia 07/07/2019  . Tobacco abuse 06/17/2018  . Ulcerative colitis ([place]) 03/05/2018  . Venous stasis 02/24/2018  . Chronic pain of left knee 12/05/2017  . Chronic pain of left ankle 12/05/2017  . Type 2 diabetes mellitus ([place]) 07/15/2017  . Seasonal allergic rhinitis 11/25/2016  . Depression 04/17/2015  . Hyperlipidemia 04/17/2015    Patient's Medications  New Prescriptions   CEFEPIME 2 G IN SODIUM CHLORIDE 0.9 % 100 ML    Inject 2 g into the vein every 12 (twelve) hours.  Previous Medications   ACETAMINOPHEN (TYLENOL) 500 MG TABLET    Take 1,000 mg by mouth every 6 (six) hours as needed for headache (pain).   ATORVASTATIN (LIPITOR) 40 MG TABLET    Take 1 tablet (40 mg total) by mouth daily. For cholesterol.   BLOOD GLUCOSE METER KIT AND SUPPLIES KIT    Dispense based on patient and insurance preference. Use up to four times daily as directed. (FOR ICD-9 250.00, 250.01).   CLOBETASOL OINTMENT (TEMOVATE) 0.05 %    Apply 1 application topically 2 (two) times daily.    DULAGLUTIDE (TRULICITY) 7.84 ON/6.2XB SOPN    Inject 0.75 mg into the skin once a week. For diabetes.   FLUTICASONE (FLONASE) 50 MCG/ACT NASAL SPRAY    Place 1 spray into both nostrils 2 (two) times daily.   IBUPROFEN (ADVIL,MOTRIN) 200 MG TABLET    Take 400-600 mg by mouth every 6 (six) hours as needed for headache (pain).   INFLIXIMAB (REMICADE) 100 MG INJECTION    Inject into the muscle once a week.   INSULIN GLARGINE (LANTUS SOLOSTAR) 100 UNIT/ML SOLOSTAR PEN    Inject 30 Units into the skin at bedtime.   METFORMIN (GLUCOPHAGE) 1000 MG TABLET    TAKE 1 TABLET BY MOUTH TWICE DAILY WITH MEALS FOR  DIABETES   MUPIROCIN OINTMENT (BACTROBAN) 2 %    Apply topically two  (2) 60g times a day. Mix with clobetasol and apply twice daily   RELION PEN NEEDLES 31G X 6 MM MISC    USE NIGHTLY WITH INSULIN AS DIRECTED   SANTYL OINTMENT    Apply topically.   TRIAMCINOLONE OINTMENT (KENALOG) 0.1 %    Apply topically.   VARENICLINE (CHANTIX) 1 MG TABLET    Take 1/2 tablet daily for three days, then 1/2 tablet twice daily for five days, then 1 tablet twice daily thereafter for smoking cessation.   VENLAFAXINE XR (EFFEXOR-XR) 150 MG 24 HR CAPSULE    Take 1 capsule (150 mg total) by mouth daily with breakfast. For depression.  Modified Medications   No medications on file  Discontinued Medications   No medications on file    Subjective: [name] is seen back for his first visit since May of last year.  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  He has now been on Remicade every 6 weeks for the past 11 months.  He felt like things were getting better in January of this year but has started to have more wound breakdown and drainage.  When he was seen at the wound center on 07/10/2020 "no  infection" was noted but cultures were obtained which grew Pseudomonas resistant to quinolones and gentamicin.  He has not been on any oral antibiotics recently.  Review of Systems: Review of Systems  Constitutional: Negative for fever.    Past Medical History:  Diagnosis Date  . Blood in stool   . Depression   . Elevated blood pressure   . Hyperlipidemia   . OSA (obstructive sleep apnea) 2014  . Seasonal allergies   . Ulcerative colitis ([place]) 03/05/2018    Social History   Tobacco Use  . Smoking status: Current Every Day Smoker    Packs/day: 1.00    Years: 14.00    Pack years: 14.00    Types: Cigarettes    Last attempt to quit: 11/25/2018    Years since quitting: 1.6  . Smokeless tobacco: Former Systems developer    Quit date: 03/17/2015  Vaping Use  . Vaping Use: Never used  Substance Use  Topics  . Alcohol use: Not Currently    Alcohol/week: 0.0 standard drinks    Comment: soical    Family History  Problem Relation Age of Onset  . Alcohol abuse Paternal Aunt   . Alcohol abuse Paternal Uncle   . Stroke Paternal Uncle   . Hyperlipidemia Maternal Grandfather   . Hypertension Maternal Grandfather   . Diabetes Maternal Grandfather   . Alcohol abuse Maternal Grandfather   . Multiple sclerosis Mother   . Dementia Mother     Allergies  Allergen Reactions  . Biaxin [Clarithromycin] Other (See Comments)    Causes colitis flares  . Milk-Related Compounds Hives    Objective: Vitals:   07/25/20 1457  BP: 137/83  Pulse: 91  Temp: 98.3 F (36.8 C)  TempSrc: Oral  SpO2: 98%  Weight: (!) 347 lb (157.4 kg)  Height: 5' 11"  (1.803 m)   Body mass index is 48.4 kg/m.  Physical Exam Constitutional:      Comments: He is pleasant and in no distress.  Cardiovascular:     Rate and Rhythm: Normal rate.  Pulmonary:     Effort: Pulmonary effort is normal.  Skin:    Comments: His chronic ulcer remains about 7 cm in diameter but there is new satellite ulceration in the 7 o'clock position and some undermining around the rim in the 2 o'clock position.  The surrounding skin is hyperpigmented and scaling.  No fluctuance.  There is no malodor.  Psychiatric:        Mood and Affect: Mood normal.       Lab Results    Problem List Items Addressed This Visit      High   Wound infection    It appears that Severiano has developed some superficial wound infection with a more resistant Pseudomonas isolate.  He has previously received IV antibiotics for Pseudomonas infection.  He agrees to proceed with PICC placement and cefepime therapy.  He will follow-up here in 2 weeks.      Relevant Medications   ceFEPIme 2 g in sodium chloride 0.9 % 100 mL   Other Relevant Orders   IR Fluoro Guide CV Line Right       Michel Bickers, MD [facility] for Infectious [place] [phone] pager   [phone] cell 07/25/2020, 3:29 PM

## 2020-07-26 NOTE — Progress Notes (Signed)
[name] ([id number]) Visit Report for 07/24/2020 Arrival Information Details Patient Name: [name] Date of Service: 07/24/2020 8:00 AM Medical Record Number: [id number] Patient Account Number: [id number] Date of Birth/Sex: 09/14/1980 (40 y.o. M) Treating RN: Carlene Coria Primary Care Mykle Pascua: [name] Other Clinician: Jeanine Luz Referring Keshun Berrett: [name] Treating Shooter Tangen/Extender: [name] Cliche in Treatment: 189 Visit Information History Since Last Visit Added or deleted any medications: No Patient Arrived: Ambulatory Had a fall or experienced change in No Arrival Time: 08:08 activities of daily living that may affect Accompanied By: self risk of falls: Transfer Assistance: None Hospitalized since last visit: No Patient Identification Verified: Yes Pain Present Now: Yes Secondary Verification Process Completed: Yes Patient Requires Transmission-Based Precautions: No Patient Has Alerts: Yes Electronic Signature(s) Signed: 07/24/2020 1:04:35 PM By: Jeanine Luz Entered By: Jeanine Luz on 07/24/2020 08:11:15 [name] ([id number]) -------------------------------------------------------------------------------- Clinic Level of Care Assessment Details Patient Name: [name] Date of Service: 07/24/2020 8:00 AM Medical Record Number: [id number] Patient Account Number: [id number] Date of Birth/Sex: [birth year] (39 y.o. M) Treating RN: [name] Primary Care Luverne Farone: [name] Other Clinician: Jeanine Luz Referring Kamoni Depree: [name] Treating Jazlene Bares/Extender: [name] Cliche in Treatment: 189 Clinic Level of Care Assessment Items TOOL 4 Quantity Score []  - Use when only an EandM is performed on FOLLOW-UP visit 0 ASSESSMENTS - Nursing Assessment / Reassessment X - Reassessment of Co-morbidities (includes updates in patient status) 1 10 X- 1 5 Reassessment of Adherence to Treatment Plan ASSESSMENTS - Wound and  Skin Assessment / Reassessment X - Simple Wound Assessment / Reassessment - one wound 1 5 []  - 0 Complex Wound Assessment / Reassessment - multiple wounds []  - 0 Dermatologic / Skin Assessment (not related to wound area) ASSESSMENTS - Focused Assessment []  - Circumferential Edema Measurements - multi extremities 0 []  - 0 Nutritional Assessment / Counseling / Intervention []  - 0 Lower Extremity Assessment (monofilament, tuning fork, pulses) []  - 0 Peripheral Arterial Disease Assessment (using hand held doppler) ASSESSMENTS - Ostomy and/or Continence Assessment and Care []  - Incontinence Assessment and Management 0 []  - 0 Ostomy Care Assessment and Management (repouching, etc.) PROCESS - Coordination of Care X - Simple Patient / Family Education for ongoing care 1 15 []  - 0 Complex (extensive) Patient / Family Education for ongoing care X- 1 10 Staff obtains Consents, Records, Test Results / Process Orders []  - 0 Staff telephones HHA, Nursing Homes / Clarify orders / etc []  - 0 Routine Transfer to another Facility (non-emergent condition) []  - 0 Routine Hospital Admission (non-emergent condition) []  - 0 New Admissions / Biomedical engineer / Ordering NPWT, Apligraf, etc. []  - 0 Emergency Hospital Admission (emergent condition) X- 1 10 Simple Discharge Coordination []  - 0 Complex (extensive) Discharge Coordination PROCESS - Special Needs []  - Pediatric / Minor Patient Management 0 []  - 0 Isolation Patient Management []  - 0 Hearing / Language / Visual special needs []  - 0 Assessment of Community assistance (transportation, D/C planning, etc.) []  - 0 Additional assistance / Altered mentation []  - 0 Support Surface(s) Assessment (bed, cushion, seat, etc.) INTERVENTIONS - Wound Cleansing / Measurement [place], [place]in ([id number]) X- 1 5 Simple Wound Cleansing - one wound []  - 0 Complex Wound Cleansing - multiple wounds X- 1 5 Wound Imaging (photographs - any number  of wounds) []  - 0 Wound Tracing (instead of photographs) X- 1 5 Simple Wound Measurement - one wound []  - 0 Complex Wound Measurement - multiple wounds INTERVENTIONS - Wound Dressings []  -  Small Wound Dressing one or multiple wounds 0 []  - 0 Medium Wound Dressing one or multiple wounds X- 1 20 Large Wound Dressing one or multiple wounds []  - 0 Application of Medications - topical []  - 0 Application of Medications - injection INTERVENTIONS - Miscellaneous []  - External ear exam 0 []  - 0 Specimen Collection (cultures, biopsies, blood, body fluids, etc.) []  - 0 Specimen(s) / Culture(s) sent or taken to Lab for analysis []  - 0 Patient Transfer (multiple staff / Civil Service fast streamer / Similar devices) []  - 0 Simple Staple / Suture removal (25 or less) []  - 0 Complex Staple / Suture removal (26 or more) []  - 0 Hypo / Hyperglycemic Management (close monitor of Blood Glucose) []  - 0 Ankle / Brachial Index (ABI) - do not check if billed separately X- 1 5 Vital Signs Has the patient been seen at the hospital within the last three years: Yes Total Score: 95 Level Of Care: New/Established - Level 3 Electronic Signature(s) Signed: 07/24/2020 1:50:11 PM By: [name], [name], RN, [name], [name], [name] Entered By: [name], [name], RN, [name], [name] on 07/24/2020 08:39:48 [name] ([id number]) -------------------------------------------------------------------------------- Encounter Discharge Information Details Patient Name: [name] Date of Service: 07/24/2020 8:00 AM Medical Record Number: [id number] Patient Account Number: [id number] Date of Birth/Sex: 1980/10/16 (39 y.o. M) Treating RN: [name] Primary Care Jene Oravec: [name] Other Clinician: Jeanine Luz Referring Chancellor Vanderloop: [name] Treating Eusebio Blazejewski/Extender: [name] Cliche in Treatment: [id number] Encounter Discharge Information Items Discharge Condition: Stable Ambulatory Status: Ambulatory Discharge Destination:  Home Transportation: Private Auto Accompanied By: self Schedule Follow-up Appointment: Yes Clinical Summary of Care: Electronic Signature(s) Signed: 07/24/2020 1:50:11 PM By: [name], [name], RN, [name], [name], [name] Entered By: [name], [name], RN, [name], [name] on 07/24/2020 [place], [place] ([id number]) -------------------------------------------------------------------------------- Lower Extremity Assessment Details Patient Name: [name] Date of Service: 07/24/2020 8:00 AM Medical Record Number: [id number] Patient Account Number: [id number] Date of Birth/Sex: 25-Jun-1980 (39 y.o. M) Treating RN: Carlene Coria Primary Care Raneisha Bress: [name] Other Clinician: Jeanine Luz Referring Kateryna Grantham: [name] Treating Geo Slone/Extender: [name] Cliche in Treatment: 189 Edema Assessment Assessed: [Left: No] [Right: No] [Left: Edema] [Right: :] Calf Left: Right: Point of Measurement: 33 cm From Medial Instep 49.2 cm Ankle Left: Right: Point of Measurement: 12 cm From Medial Instep 30.2 cm Vascular Assessment Pulses: Dorsalis Pedis Palpable: [Left:Yes] Electronic Signature(s) Signed: 07/24/2020 12:40:40 PM By: Carlene Coria RN Entered By: Carlene Coria on 07/24/2020 12:40:40 [name] ([id number]) -------------------------------------------------------------------------------- Multi Wound Chart Details Patient Name: [name] Date of Service: 07/24/2020 8:00 AM Medical Record Number: [id number] Patient Account Number: [id number] Date of Birth/Sex: 19-Apr-1981 (39 y.o. M) Treating RN: [name] Primary Care Tekeshia Klahr: [name] Other Clinician: Jeanine Luz Referring Anasia Agro: [name] Treating Luiza Carranco/Extender: [name] Cliche in Treatment: 189 Vital Signs Height(in): 71 Pulse(bpm): 94 Weight(lbs): 338 Blood Pressure(mmHg): 143/91 Body Mass Index(BMI): 47 Temperature(F): 98.5 Respiratory Rate(breaths/min): 18 Photos: [N/A:N/A] Wound Location:  Left, Lateral Lower Leg N/A N/A Wounding Event: Gradually Appeared N/A N/A Primary Etiology: Pyoderma N/A N/A Comorbid History: Sleep Apnea, Hypertension, Colitis N/A N/A Date Acquired: 11/18/2015 N/A N/A Weeks of Treatment: 189 N/A N/A Wound Status: Open N/A N/A Measurements L x W x D (cm) 6.5x6.5x1.1 N/A N/A Area (cm) : 33.183 N/A N/A Volume (cm) : 36.501 N/A N/A % Reduction in Area: -576.00% N/A N/A % Reduction in Volume: -829.50% N/A N/A Classification: Full Thickness With Exposed N/A N/A Support Structures Exudate Amount: Large N/A N/A Exudate Type: Purulent N/A N/A  Exudate Color: yellow, brown, green N/A N/A Wound Margin: Epibole N/A N/A Granulation Amount: Medium (34-66%) N/A N/A Granulation Quality: Red, Pink N/A N/A Necrotic Amount: Medium (34-66%) N/A N/A Exposed Structures: Fat Layer (Subcutaneous Tissue): N/A N/A Yes Muscle: Yes Fascia: No Tendon: No Joint: No Bone: No Epithelialization: None N/A N/A Treatment Notes Electronic Signature(s) Signed: 07/24/2020 1:50:11 PM By: [name], [name], RN, [name], [name], [name] Entered By: [name], [name], RN, [name], [name] on 07/24/2020 08:34:42 [name] ([id number]) -------------------------------------------------------------------------------- Multi-Disciplinary Care Plan Details Patient Name: [name] Date of Service: 07/24/2020 8:00 AM Medical Record Number: [id number] Patient Account Number: [id number] Date of Birth/Sex: [birth year] (39 y.o. M) Treating RN: [name] Primary Care Mars Scheaffer: [name] Other Clinician: Jeanine Luz Referring Carmelita Amparo: [name] Treating Tyquarius Paglia/Extender: [name] Cliche in Treatment: [place] reviewed with physician Active Inactive Wound/Skin Impairment Nursing Diagnoses: Impaired tissue integrity Knowledge deficit related to ulceration/compromised skin integrity Goals: Patient/caregiver will verbalize understanding of skin care regimen Date  Initiated: 12/11/2016 Target Resolution Date: 07/24/2017 Goal Status: Active Ulcer/skin breakdown will have a volume reduction of 30% by week 4 Date Initiated: 12/11/2016 Date Inactivated: 06/26/2020 Target Resolution Date: 03/13/2017 Goal Status: Unmet Unmet Reason: comorbities Ulcer/skin breakdown will have a volume reduction of 50% by week 8 Date Initiated: 12/11/2016 Date Inactivated: 06/26/2020 Target Resolution Date: 03/13/2017 Goal Status: Unmet Unmet Reason: comorbities Ulcer/skin breakdown will have a volume reduction of 80% by week 12 Date Initiated: 12/11/2016 Date Inactivated: 06/26/2020 Target Resolution Date: 03/13/2017 Goal Status: Unmet Unmet Reason: comorbities Ulcer/skin breakdown will heal within 14 weeks Date Initiated: 12/11/2016 Date Inactivated: 06/26/2020 Target Resolution Date: 03/13/2017 Goal Status: Unmet Unmet Reason: comorbities Interventions: Assess patient/caregiver ability to obtain necessary supplies Assess patient/caregiver ability to perform ulcer/skin care regimen upon admission and as needed Assess ulceration(s) every visit Provide education on ulcer and skin care Treatment Activities: Skin care regimen initiated : 12/11/2016 Topical wound management initiated : 12/11/2016 Notes: Electronic Signature(s) Signed: 07/24/2020 1:50:11 PM By: [name], [name], RN, [name], [name], [name] Entered By: [name], [name], RN, [name], [name] on 07/24/2020 08:34:34 [name] ([id number]) -------------------------------------------------------------------------------- Pain Assessment Details Patient Name: [name] Date of Service: 07/24/2020 8:00 AM Medical Record Number: [id number] Patient Account Number: [id number] Date of Birth/Sex: 03-31-1981 (39 y.o. M) Treating RN: Carlene Coria Primary Care Yoko Mcgahee: [name] Other Clinician: Jeanine Luz Referring Erin Obando: [name] Treating Elyanah Farino/Extender: [name] Cliche in Treatment: 189 Active Problems Location of  Pain Severity and Description of Pain Patient Has Paino Yes Site Locations Rate the pain. Current Pain Level: 4 Character of Pain Describe the Pain: Burning Pain Management and Medication Current Pain Management: Electronic Signature(s) Signed: 07/24/2020 1:04:35 PM By: Jeanine Luz Signed: 07/26/2020 12:09:52 PM By: Carlene Coria RN Entered By: Jeanine Luz on 07/24/2020 08:13:51 [name] ([id number]) -------------------------------------------------------------------------------- Patient/Caregiver Education Details Patient Name: [name] Date of Service: 07/24/2020 8:00 AM Medical Record Number: [id number] Patient Account Number: [id number] Date of Birth/Gender: [birth year] (39 y.o. M) Treating RN: [name] Primary Care Physician: [name] Other Clinician: Jeanine Luz Referring Physician: [name] Treating Physician/Extender: [name] Cliche in Treatment: 189 Education Assessment Education Provided To: Patient Education Topics Provided Infection: Handouts: Infection Prevention and Management Methods: Demonstration, Explain/Verbal Responses: State content correctly Wound/Skin Impairment: Handouts: Caring for Your Ulcer Methods: Demonstration, Explain/Verbal Responses: State content correctly Electronic Signature(s) Signed: 07/24/2020 1:50:11 PM By: [name], [name], RN, [name], [name], [name] Entered By: [name], [name], RN, [name], [name] on 07/24/2020 08:40:15 [name] ([id number]) -------------------------------------------------------------------------------- Wound Assessment  Details Patient Name: ALARIK, RADU Date of Service: 07/24/2020 8:00 AM Medical Record Number: [id number] Patient Account Number: [id number] Date of Birth/Sex: 06-21-1980 (40 y.o. M) Treating RN: Carlene Coria Primary Care Margarita Bobrowski: [name] Other Clinician: Jeanine Luz Referring Piedad Standiford: [name] Treating Velecia Ovitt/Extender: [name] Cliche in Treatment:  189 Wound Status Wound Number: 1 Primary Etiology: Pyoderma Wound Location: Left, Lateral Lower Leg Wound Status: Open Wounding Event: Gradually Appeared Comorbid History: Sleep Apnea, Hypertension, Colitis Date Acquired: 11/18/2015 Weeks Of Treatment: 189 Clustered Wound: No Photos Wound Measurements Length: (cm) 6.5 Width: (cm) 6.5 Depth: (cm) 1.1 Area: (cm) 33.183 Volume: (cm) 36.501 % Reduction in Area: -576% % Reduction in Volume: -829.5% Epithelialization: None Tunneling: No Undermining: No Wound Description Classification: Full Thickness With Exposed Support Structures Wound Margin: Epibole Exudate Amount: Large Exudate Type: Purulent Exudate Color: yellow, brown, green Foul Odor After Cleansing: No Slough/Fibrino Yes Wound Bed Granulation Amount: Medium (34-66%) Exposed Structure Granulation Quality: Red, Pink Fascia Exposed: No Necrotic Amount: Medium (34-66%) Fat Layer (Subcutaneous Tissue) Exposed: Yes Necrotic Quality: Adherent Slough Tendon Exposed: No Muscle Exposed: Yes Necrosis of Muscle: No Joint Exposed: No Bone Exposed: No Treatment Notes Wound #1 (Lower Leg) Wound Laterality: Left, Lateral Cleanser Soap and Water Discharge Instruction: Gently cleanse wound with antibacterial soap, rinse and pat dry prior to dressing wounds Huebert, Cristofher ([id number]) Peri-Wound Care Topical Primary Dressing Secondary Dressing Mepilex Border Flex, 6x6 (in/in) Discharge Instruction: Apply to wound as directed. Do not cut. Secured With Compression Wrap Compression Stockings Add-Ons Electronic Signature(s) Signed: 07/24/2020 1:04:35 PM By: Jeanine Luz Signed: 07/26/2020 12:09:52 PM By: Carlene Coria RN Entered By: Jeanine Luz on 07/24/2020 08:20:28 [name] ([id number]) -------------------------------------------------------------------------------- Vitals Details Patient Name: [name] Date of Service: 07/24/2020 8:00 AM Medical Record  Number: [id number] Patient Account Number: [id number] Date of Birth/Sex: Sep 03, 1980 (39 y.o. M) Treating RN: Carlene Coria Primary Care Frona Yost: [name] Other Clinician: Jeanine Luz Referring Kenlee Maler: [name] Treating Lesha Jager/Extender: [name] Cliche in Treatment: 189 Vital Signs Time Taken: 08:13 Temperature (F): 98.5 Height (in): 71 Pulse (bpm): 94 Weight (lbs): 338 Respiratory Rate (breaths/min): 18 Body Mass Index (BMI): 47.1 Blood Pressure (mmHg): 143/91 Reference Range: 80 - 120 mg / dl Electronic Signature(s) Signed: 07/24/2020 1:04:35 PM By: Jeanine Luz Entered By: Jeanine Luz on 07/24/2020 08:13:33

## 2020-07-26 NOTE — Telephone Encounter (Signed)
RN spoke to Laverne at short stay to schedule patient's first dose appointment.   Beryle Flock, RN

## 2020-07-27 NOTE — Telephone Encounter (Signed)
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   Beryle Flock, RN

## 2020-07-31 ENCOUNTER — Other Ambulatory Visit: Payer: Self-pay | Admitting: Radiology

## 2020-07-31 ENCOUNTER — Encounter: Payer: 59 | Admitting: Physician Assistant

## 2020-07-31 ENCOUNTER — Other Ambulatory Visit: Payer: Self-pay

## 2020-07-31 ENCOUNTER — Other Ambulatory Visit (HOSPITAL_COMMUNITY): Payer: Self-pay | Admitting: *Deleted

## 2020-07-31 DIAGNOSIS — L88 Pyoderma gangrenosum: Secondary | ICD-10-CM | POA: Diagnosis not present

## 2020-07-31 NOTE — Progress Notes (Addendum)
[name], [name] ([id number]) Visit Report for 07/31/2020 Chief Complaint Document Details Patient Name: [name], [name] Date of Service: 07/31/2020 8:00 AM Medical Record Number: [id number] Patient Account Number: [id number] Date of Birth/Sex: 02/25/1981 (40 y.o. 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 M) Treating RN: Carlene Coria Primary Care Provider: [name] Other Clinician: Jeanine Luz Referring Provider: [name] Treating Provider/Extender: [name] Cliche in Treatment: 190 History of Present Illness HPI Description: 12/04/16; 40 year old man who comes into the clinic today for review of a wound on the posterior left calf. He tells me that is been there for about a year. He is not a diabetic he does smoke half a pack per day. He was seen in the ER on 11/20/16 felt to have cellulitis around the wound and was given clindamycin. An x-ray did not show osteomyelitis. The patient initially tells me that he has a milk allergy that sets off a pruritic itching rash on his lower legs which she scratches incessantly and he thinks that's what may have set up the wound. He has been using various topical antibiotics and ointments without any effect. He works in a [employer] and is on his feet all day.  He does not have a prior history of wounds however he does have the rash on both lower legs the right arm and the ventral aspect of his left arm. These are excoriations and clearly have had scratching however there are of macular looking areas on both legs including a substantial larger area on the right leg. This does not have an underlying open area. There is no blistering. The patient tells me that 2 years ago in [place] in response to the rash on his legs he saw a dermatologist who told him he had a condition which may be pyoderma gangrenosum although I may be putting words into his mouth. He seemed to recognize this. 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 He is not in any treatment for this. He's had no recent travel 12/11/16; the patient arrives today with his wound and roughly the same condition we've been using silver alginate this is a deep punched out wound with some surrounding erythema but no tenderness. Biopsy I did did not show confirmed pyoderma gangrenosum suggested nonspecific inflammation and vasculitis but does not provide an actual description of what was seen by the pathologist. I'm really not able to understand this We have also received information from the patient's dermatologist in [place] notes from April 2016. This was a doctor Agarwal-antal. 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 He was prescribed topical steroid high potency under occlusion which helped but at this point the patient did not have a deep punched out wound. 12/18/16; the patient's wound is larger in terms of surface area however this surface looks better and there is less depth. The surrounding erythema also is better. The patient states that the wrap we put on came off 2 days ago when he has been  using his compression stockings. He we are in the process of getting a dermatology consult. 12/26/16 on evaluation today patient's left lower extremity wound  shows evidence of infection with surrounding erythema noted. He has been tolerating the dressing changes but states that he has noted more discomfort. There is a larger area of erythema surrounding the wound. No fevers, chills, nausea, or vomiting noted at this time. With that being said the wound still does have slough covering the surface. He is not allergic to any medication that he is aware of at this point. In regard to his right lower extremity he had several regions that are erythematous and pruritic he wonders if there's anything we can do to help that. 01/02/17 I reviewed patient's wound culture which was obtained his visit last week. He was placed on doxycycline at that point. Unfortunately that does not appear to be an antibiotic that would likely help with the situation however the pseudomonas noted on culture is sensitive to Cipro. Also unfortunately patient's wound seems to have a large compared to last week's evaluation. Not severely so but there are definitely increased measurements in general. He is continuing to have discomfort as well he writes this to be a seven out of 10. In fact he would prefer me not to perform any debridement today due to the fact that he is having discomfort and considering he has an active infection on the little reluctant to do so anyway. No fevers, chills, nausea, or vomiting noted at this time. 01/08/17; patient seems dermatology on September 5. I suspect dermatology will want the slides from the biopsy I did sent to their pathologist. I'm not sure if there is a way we can expedite that. In any case the culture I did before I left on vacation 3 weeks ago showed Pseudomonas he was given 10 days of Cipro and per her description of her intake nurses is actually somewhat better this week although the wound is quite a bit bigger than I remember the last time I saw this. He still has 3 more days of Cipro 01/21/17; dermatology appointment tomorrow. He has completed  the ciprofloxacin for Pseudomonas. Surface of the wound looks better however he is had some deterioration in the lesions on his right leg. Meantime the left lateral leg wound we will continue with sample 01/29/17; patient had his dermatology appointment but I can'[name] yet see that note. He is completed his antibiotics. The wound is more superficial but considerably larger in circumferential area than when he came in. This is in his left lateral calf. He also has swollen erythematous areas with superficial wounds on the right leg and small papular areas on both arms. There apparently areas in her his upper thighs and buttocks I did not look at those. Dermatology biopsied the right leg. Hopefully will have their input next week. 02/05/17; patient went back to see his dermatologist who told him that he had a "scratching problem" as well as staph. He is now on a 30 day course of doxycycline and I believe she gave him triamcinolone cream to the right leg areas to help with the itching [not exactly sure but probably triamcinolone]. She apparently looked at the left lateral leg wound although this was not rebiopsied and I think felt to be ultimately part of the same pathogenesis. He is using sample border foam and changing nevus himself. He now has a new open area on the right posterior leg which was his biopsy site I don'[name] have any  of the dermatology notes 02/12/17; we put the patient in compression last week with SANTYL to the wound on the left leg and the biopsy. Edema is much better and the depth of the wound is now at level of skin. Area is still the same oBiopsy site on the right lateral leg we've also been using santyl with a border foam dressing and he is changing this himself. 02/19/17; Using silver alginate started last week to both the substantial left leg wound and the biopsy site on the right wound. He is tolerating compression well. Has a an appointment with his primary M.D. tomorrow wondering about  diuretics although I'm wondering if the edema problem is actually lymphedema 02/26/17; the patient has been to see his primary doctor Dr. Jerrel Ivory at [facility]. She started him on Lasix 20 mg and this seems to have helped with the edema. However we are not making substantial change with the left lateral calf wound and inflammation. The biopsy site on the right leg also looks stable but not really all that different. 03/12/17; the patient has been to see vein and vascular Dr. Lucky Cowboy. He has had venous reflux studies I have not reviewed these. I did get a call from his dermatology office. They felt that he might have pathergy based on their biopsy on his right leg which led them to look at the slides of [name], [name] ([id number]) the biopsy I did on the left leg and they wonder whether this represents pyoderma gangrenosum which was the original supposition in a man with ulcerative colitis albeit inactive for many years. They therefore recommended clobetasol and tetracycline i.e. 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 03/26/17; apparently the patient just had reflux studies not an appointment with Dr. dew. She arrives in clinic today having applied clobetasol for 2-3 weeks. He notes over the last 2-3 days excessive drainage having to change the dressing 3-4 times a day and also expanding erythema. 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 Lillia Carmel at [facility] dermat[facility] locally who kindly saw him at my request. 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 My concern was the degree of erythema medially extending from his left leg wound which was either  inflammation from pyoderma or cellulitis. I put him on Augmentin however culture of the wound showed Pseudomonas which is quinolone sensitive. I really don'[name] believe he has cellulitis however in view of everything I will continue and give him a course of Cipro. He is also on doxycycline as an immune modulator for the pyoderma. In addition to his original wound on the left lateral leg with surrounding erythema he has a wound on the right posterior calf which was an original biopsy site done by dermatology. 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 Saw Dr. Lillia Carmel yesterday. He has been using topical antibiotics to both wound areas his original wound on the left and the biopsies/pathergy area on the right. There is definitely some improvement in the inflammation around the wound on the right although the patient states he has increasing sensitivity of the wounds. He is on prednisone 60 and doxycycline 1 as prescribed by Dr. Lillia Carmel. He is covering the  topical antibiotic with gauze and putting this in his own compression stocks and changing this daily. He states that Dr. Lottie Rater did a culture of the left leg wound yesterday 05/07/17; pyoderma gangrenosum. The patient saw Dr. Lillia Carmel yesterday and has a follow-up with her in one month. He is still using topical antibiotics to both wounds although he can'[name] recall exactly what type. He is still on prednisone 60 mg. Dr. Lillia Carmel stated that the doxycycline could stop if we were in agreement. 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 The right medial leg was induced by biopsy/pathergy. The area on the right is essentially healed. 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 The right medial leg has  closed and remains closed. He is still on prednisone 60. oHe tells me he missed his last dermatology appointment with Dr. Lillia Carmel but will make another appointment. He reports that her blood sugar at a recent screen in [place] was high 200's. He was 180 today. 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 I think he is going to require still much longer prednisone perhaps another 3 months before attempting to taper. In the meantime his wound is a lot better. Smaller. He is cleaning this off daily and applying topical antibiotics. When he was last in the clinic I thought about changing to [facility] and actually put in a couple of calls to dermatology although probably not during their business hours. In any case the wound looks better smaller I don'[name] think there is any need to change what he is doing 08/06/17-he is here in follow up evaluation for pyoderma left leg ulcer. He continues on oral prednisone. He has been using triple antibiotic ointment. There is surface debris and we will transition to [facility] and have him return in 2 weeks. He has lost 30 pounds since his last appointment with lifestyle modification. He may benefit from topical steroid cream for treatment this can be considered at a later date. 08/22/17 on evaluation today patient appears to actually be doing rather well in regard to his left lateral lower extremity ulcer. He has actually been managed by Dr. Dellia Nims most recently. Patient is currently on oral steroids at this time. This seems to have been of benefit for him. Nonetheless his last visit was actually with Leah on 08/06/17. Currently he is not utilizing any topical steroid creams although this could be of benefit as well. No fevers, chills, nausea, or vomiting noted at this time. 09/05/17 on evaluation today patient appears to be doing better in regard to his left lateral lower extremity ulcer. He has been tolerating the dressing changes without complication. He is using Santyl  with good effect. Overall I'm very pleased with how things are standing at this point. Patient likewise is happy that this is doing better. 09/19/17 on evaluation today patient actually appears to be doing rather well in regard to his left lateral lower extremity ulcer. 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 He's not having any significant pain. 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 He has been tolerating the Santyl without complication and in general I feel like he's making good progress. 10/17/17  on evaluation today patient appears to be doing very well in regard to his left lateral lower surety ulcer. He has been tolerating the dressing changes without complication. There does not appear to be any evidence of infection he's alternating the Santyl and the triple antibiotic ointment every other day this seems to be doing well for him. 11/03/17 on evaluation today patient appears to be doing very well in regard to his left lateral lower extremity ulcer. He is been tolerating the dressing changes without complication which is good news. Fortunately there does not appear to be any evidence of infection which is also great news. Overall is doing excellent they are starting to taper down on the prednisone is down to 40 mg at this point it also started topical clobetasol for him. 11/17/17 on evaluation today patient appears to be doing well in regard to his left lateral lower surety ulcer. He's been tolerating the dressing changes without complication. He does note that he is having no pain, no excessive drainage or discharge, and overall he feels like things are going about how he would expect and hope they would. Overall he seems to have no evidence of infection at this time in my opinion which is  good news. 12/04/17-He is seen in follow-up evaluation for right lateral lower extremity ulcer. He has been applying topical steroid cream. Today's measurement show slight increase in size. Over the next 2 weeks we will transition to every other day Santyl and steroid cream. He has been encouraged to monitor for changes and notify clinic with any concerns 12/15/17 on evaluation today patient's left lateral motion the ulcer and fortunately is doing worse again at this point. This just since last week to this week has close to doubled in size according to the patient. I did not seeing last week's I do not have a visual to compare this to in our system was also down so we do not have all the charts and at this point. Nonetheless it does have me somewhat concerned in regard to the fact that again he was worried enough about it he has contact the dermatology that placed them back on the full strength, 50 mg a day of the prednisone that he was taken previous. He continues to alternate using clobetasol along with Santyl at this point. He is obviously somewhat frustrated. 12/22/17 on evaluation today patient appears to be doing a little worse compared to last evaluation. Unfortunately the wound is a little deeper and slightly larger than the last week's evaluation. With that being said he has made some progress in regard to the irritation surrounding at this time unfortunately despite that progress that's been made he still has a significant issue going on here. 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 [name], [name] ([id number]) For that reason I discussed with him today the possibility of perform a wound culture to ensure there's nothing overtly infected. 01/06/18 on evaluation today patient's wound is larger and deeper than previously evaluated. With that being  said it did appear that his wound was infected after my last evaluation with him. Subsequently I did end up prescribing a prescription for Bactrim DS which she has been taking and having no complication with. Fortunately there does not appear to be any evidence of infection at this point in time as far as anything spreading, no want to touch, and  overall I feel like things are showing signs of improvement. 01/13/18 on evaluation today patient appears to be even a little larger and deeper than last time. There still muscle exposed in the base of the wound. Nonetheless he does appear to be less erythematous I do believe inflammation is calming down also believe the infection looks like it's probably resolved at this time based on what I'm seeing. No fevers, chills, nausea, or vomiting noted at this time. 01/30/18 on evaluation today patient actually appears to visually look better for the most part. Unfortunately those visually this looks better he does seem to potentially have what may be an abscess in the muscle that has been noted in the central portion of the wound. This is the first time that I have noted what appears to be fluctuance in the central portion of the muscle. With that being said I'm somewhat more concerned about the fact that this might indicate an abscess formation at this location. I do believe that an ultrasound would be appropriate. This is likely something we need to try to do as soon as possible. He has been switch to mupirocin ointment and he is no longer using the steroid ointment as prescribed by dermatology he sees them again next week he's been decreased from 60 to 40 mg of prednisone. 03/09/18 on evaluation today patient actually appears to be doing a little better compared to last time I saw him. There's not as much erythema surrounding the wound itself. He I did review his most recent infectious disease note which was dated 02/24/18. He saw Dr. Michel Bickers in [place].  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 Patient is now off of antibiotics and they are continually observing this. There seems to be no change in the past two weeks in my pinion based on what the patient says and what I see today compared to what Dr. Megan Salon likely saw two weeks ago. No fevers, chills, nausea, or vomiting noted at this time. 03/23/18 on evaluation today patient's wound actually appears to be showing signs of improvement which is good news. He is currently still on the Dapsone. He is also working on tapering the prednisone to get off of this and Dr. Lottie Rater is working with him in this regard. Nonetheless overall I feel like the wound is doing well it does appear based on the infectious disease note that I reviewed from Dr. 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 I did also review the results of his ultrasound of left lower extremity which revealed there was a dentist tissue in the base of the wound without an abscess noted. 04/06/18 on evaluation today the patient's left lateral lower extremity ulcer actually appears to be doing fairly well which is excellent news. There does not appear to be any evidence of infection at this time which is also great news. Overall he still does have a significantly large ulceration although little by little he seems to be making progress. He is down to 10 mg a day of the prednisone. 04/20/18 on evaluation today patient actually appears to be doing excellent at this time in regard to his left lower extremity ulcer. He's making signs of good progress unfortunately this is taking much longer than we would really like to see  but nonetheless he is making progress. Fortunately there does not appear to be any evidence of infection at this time. No  fevers, chills, nausea, or vomiting noted at this time. The patient has not been using the Santyl due to the cost he hadn'[name] got in this field yet. He's mainly been using the antibiotic ointment topically. Subsequently he also tells me that he really has not been scrubbing in the shower I think this would be helpful again as I told him it doesn'[name] have to be anything too aggressive to even make it believe just enough to keep it free of some of the loose slough and biofilm on the wound surface. 05/11/18 on evaluation today patient's wound appears to be making slow but sure progress in regard to the left lateral lower extremity ulcer. He is been tolerating the dressing changes without complication. Fortunately there does not appear to be any evidence of infection at this time. He is still just using triple antibiotic ointment along with clobetasol occasionally over the area. He never got the Santyl and really does not seem to intend to in my pinion. 06/01/18 on evaluation today patient appears to be doing a little better in regard to his left lateral lower extremity ulcer. He states that overall he does not feel like he is doing as well with the Dapsone as he did with the prednisone. Nonetheless he sees his dermatologist later today and is gonna talk to them about the possibility of going back on the prednisone. Overall again I believe that the wound would be better if you would utilize Santyl but he really does not seem to be interested in going back to the [facility] at this point. He has been using triple antibiotic ointment. 06/15/18 on evaluation today patient's wound actually appears to be doing about the same at this point. Fortunately there is no signs of infection at this time. He has made slight improvements although he continues to not really want to clean the wound bed at this point. He states that he just doesn'[name] mess with it he doesn'[name] want to cause any problems with everything else he has going  on. He has been on medication, antibiotics as prescribed by his dermatologist, for a staff infection of his lower extremities which is really drying out now and looking much better he tells me. Fortunately there is no sign of overall infection. 06/29/18 on evaluation today patient appears to be doing well in regard to his left lateral lower surety ulcer all things considering. Fortunately his staff infection seems to be greatly improved compared to previous. He has no signs of infection and this is drying up quite nicely. He is still the doxycycline for this is no longer on cental, Dapsone, or any of the other medications. His dermatologist has recommended possibility of an infusion but right now he does not want to proceed with that. 07/13/18 on evaluation today patient appears to be doing about the same in regard to his left lateral lower surety ulcer. Fortunately there's no signs of infection at this time which is great news. Unfortunately he still builds up a significant amount of Slough/biofilm of the surface of the wound he still is not really cleaning this as he should be appropriately. Again I'm able to easily with saline and gauze remove the majority of this on the surface which if you would do this at home would likely be a dramatic improvement for him as far as getting the area to improve. Nonetheless overall I  still feel like he is making progress is just very slow. I think Santyl will be of benefit for him as well. Still he has not gotten this as of this point. 07/27/18 on evaluation today patient actually appears to be doing little worse in regards of the erythema around the periwound region of the wound he also tells me that he's been having more drainage currently compared to what he was experiencing last time I saw him. He states not quite as bad as what he had because this was infected previously but nonetheless is still appears to be doing poorly. Fortunately there is no evidence  of systemic infection at this point. The patient tells me that he is not going to be able to afford the Santyl. He is still waiting to hear about the infusion therapy with his dermatologist. Apparently she wants an updated colonoscopy first. 08/10/18 on evaluation today patient appears to be doing better in regard to his left lateral lower extremity ulcer. Fortunately he is showing signs of improvement in this regard he's actually been approved for Remicade infusion's as well although this has not been scheduled as of yet. Fortunately there's no signs of active infection at this time in regard to the wound although he is having some issues with infection of the right lower extremity is been seen as dermatologist for this. Fortunately they are definitely still working with him trying to keep things under control. [name], [name] ([id number]) 09/07/18 on evaluation today patient is actually doing rather well in regard to his left lateral lower extremity ulcer. He notes these actually having some hair grow back on his extremity which is something he has not seen in years. He also tells me that the pain is really not giving them any trouble at this time which is also good news overall she is very pleased with the progress he's using a combination of the mupirocin along with the probate is all mixed. 09/21/18 on evaluation today patient actually appears to be doing fairly well all things considered in regard to his looks from the ulcer. He's been tolerating the dressing changes without complication. Fortunately there's no signs of active infection at this time which is good news he is still on all antibiotics or prevention of the staff infection. He has been on prednisone for time although he states it is gonna contact his dermatologist and see if she put them on a short course due to some irritation that he has going on currently. Fortunately there's no evidence of any overall worsening this is going very slow  I think cental would be something that would be helpful for him although he states that $50 for tube is quite expensive. He therefore is not willing to get that at this point. 10/06/18 on evaluation today patient actually appears to be doing decently well in regard to his left lateral leg ulcer. He's been tolerating the dressing changes without complication. Fortunately there's no signs of active infection at this time. Overall I'm actually rather pleased with the progress he's making although it's slow he doesn'[name] show any signs of infection and he does seem to be making some improvement. I do believe that he may need a switch up and dressings to try to help this to heal more appropriately and quickly. 10/19/18 on evaluation today patient actually appears to be doing better in regard to his left lateral lower extremity ulcer. This is shown signs of having much less Slough buildup at this point due to the fact  he has been using the Santyl. Obviously this is very good news. The overall size of the wound is not dramatically smaller but again the appearance is. 11/02/18 on evaluation today patient actually appears to be doing quite well in regard to his lower [place] ulcer. A lot of the skin around the ulcer is actually somewhat irritating at this point this seems to be more due to the dressing causing irritation from the adhesive that anything else. Fortunately there is no signs of active infection at this time. 11/24/18 on evaluation today patient appears to be doing a little worse in regard to his overall appearance of his lower extremity ulcer. There's more erythema and warmth around the wound unfortunately. He is currently on doxycycline which he has been on for some time. With that being said I'm not sure that seems to be helping with what appears to possibly be an acute cellulitis with regard to his left lower extremity ulcer. No fevers, chills, nausea, or vomiting noted at this time. 12/08/18 on  evaluation today patient's wounds actually appears to be doing significantly better compared to his last evaluation. He has been using Santyl along with alternating tripling about appointment as well as the steroid cream seems to be doing quite well and the wound is showing signs of improvement which is excellent news. Fortunately there's no evidence of infection and in fact his culture came back negative with only normal skin flora noted. 12/21/2018 upon evaluation today patient actually appears to be doing excellent with regard to his ulcer. This is actually the best that I have seen it since have been helping to take care of him. It is both smaller as well as less slough noted on the surface of the wound and seems to be showing signs of good improvement with new skin growing from the edges. He has been using just the triamcinolone he does wonder if he can get a refill of that ointment today. 01/04/2019 upon evaluation today patient actually appears to be doing well with regard to his left lateral lower extremity ulcer. With that being said it does not appear to be that he is doing quite as well as last time as far as progression is concerned. There does not appear to be any signs of infection or significant irritation which is good news. With that being said I do believe that he may benefit from switching to a collagen based dressing based on how clean The wound appears. 01/18/2019 on evaluation today patient actually appears to be doing well with regard to his wound on the left lower extremity. He is not made a lot of progress compared to where we were previous but nonetheless does seem to be doing okay at this time which is good news. There is no signs of active infection which is also good news. My only concern currently is I do wish we can get him into utilizing the collagen dressing his insurance would not pay for the supplies that we ordered although it appears that he may be able to order this  through his supply company that he typically utilizes. This is [employer]. Nonetheless he did try to order it during the office visit today and it appears this did go through. We will see if he can get that it is a different brand but nonetheless he has collagen and I do think will be beneficial. 02/01/2019 on evaluation today patient actually appears to be doing a little worse today in regard to the overall size of  his wounds. Fortunately there is no signs of active infection at this time. That is visually. Nonetheless when this is happened before it was due to infection. For that reason were somewhat concerned about that this time as well. 02/08/2019 on evaluation today patient unfortunately appears to be doing slightly worse with regard to his wound upon evaluation today. Is measuring a little deeper and a little larger unfortunately. I am not really sure exactly what is causing this to enlarge he actually did see his dermatologist she is going to see about initiating Humira for him. Subsequently she also did do steroid injections into the wound itself in the periphery. Nonetheless still nonetheless he seems to be getting a little bit larger he is gone back to just using the steroid cream topically which I think is appropriate. I would say hold off on the collagen for the time being is definitely a good thing to do. Based on the culture results which we finally did get the final result back regarding it shows staph as the bacteria noted again that can be a normal skin bacteria based on the fact however he is having increased drainage and worsening of the wound measurement wise I would go ahead and place him on an antibiotic today I do believe for this. 02/15/2019 on evaluation today patient actually appears to be doing somewhat better in regard to his ulcer. There is no signs of worsening at this time I did review his culture results which showed evidence of Staphylococcus aureus but not MRSA. Again  this could just be more related to the normal skin bacteria although he states the drainage has slowed down quite a bit he may have had a mild infection not just colonization. And was much smaller and then since around10/04/2019 on evaluation today patient appears to be doing unfortunately worse as far as the size of the wound. I really feel like that this is steadily getting larger again it had been doing excellent right at the beginning of September we have seen a steady increase in the area of the wound it is almost 2-1/2 times the size it was on September 1. Obviously this is a bad trend this is not wanting to see. For that reason we went back to using just the topical triamcinolone cream which does seem to help with inflammation. I checked him for bacteria by way of culture and nothing showed positive there. I am considering giving him a short course of a tapering steroid Dosepak today to see if that is can be beneficial for him. The patient is in agreement with giving that a try. 03/08/2019 on evaluation today patient appears to be doing very well in comparison to last evaluation with regard to his lower extremity ulcer. This is showing signs of less inflammation and actually measuring slightly smaller compared to last time every other week over the past month and a half he has been measuring larger larger larger. Nonetheless I do believe that the issue has been inflammation the prednisone does seem to [facility] ([id number]) have been beneficial for him which is good news. No fevers, chills, nausea, vomiting, or diarrhea. 03/22/2019 on evaluation today patient appears to be doing about the same with regard to his leg ulcer. He has been tolerating the dressing changes without complication. With that being said the wound seems to be mostly arrested at its current size but really is not making any progress except for when we prescribed the prednisone. He did show some signs of  dropping as far as  the overall size of the wound during that interval week. Nonetheless this is something he is not on long-term at this point and unfortunately I think he is getting need either this or else the Humira which his dermatologist has discussed try to get approval for. With that being said he will be seeing his dermatologist on the 11th of this month that is November. 04/19/2019 on evaluation today patient appears to be doing really about the same the wound is measuring slightly larger compared to last time I saw him. He has not been into the office since November 2 due to the fact that he unfortunately had Covid as that his entire family. He tells me that it was rough but they did pull-through and he seems to be doing much better. Fortunately there is no signs of active infection at this time. No fevers, chills, nausea, vomiting, or diarrhea. 05/10/2019 on evaluation today patient unfortunately appears to be doing significantly worse as compared to last time I saw him. He does tell me that he has had his first dose of Humira and actually is scheduled to get the next one in the upcoming week. With that being said he tells me also that in the past several days he has been having a lot of issues with green drainage she showed me a picture this is more blue-green in color. He is also been having issues with increased sloughy buildup and the wound does appear to be larger today. Obviously this is not the direction that we want everything to take based on the starting of his Humira. Nonetheless I think this is definitely a result of likely infection and to be honest I think this is probably Pseudomonas causing the infection based on what I am seeing. 05/24/2019 on evaluation today patient unfortunately appears to be doing significantly worse compared to his prior evaluation with me 2 weeks ago. I did review his culture results which showed that he does have Staph aureus as well as Pseudomonas noted on the culture.  Nonetheless the Levaquin that I prescribed for him does not appear to have been appropriate and in fact he tells me he is no longer experiencing the green drainage and discharge that he had at the last visit. Fortunately there is no signs of active infection at this time which is good news although the wound has significantly worsened it in fact is much deeper than it was previous. We have been utilizing up to this point triamcinolone ointment as the prescription topical of choice but at this time I really feel like that the wound is getting need to be packed in order to appropriately manage this due to the deeper nature of the wound. Therefore something along the lines of an alginate dressing may be more appropriate. 05/31/2019 upon inspection today patient's wound actually showed signs of doing poorly at this point. Unfortunately he just does not seem to be making any good progress despite what we have tried. He actually did go ahead and pick up the Cipro and start taking that as he was noticing more green drainage he had previously completed the Levaquin that I prescribed for him as well. Nonetheless he missed his appointment for the seventh last week on Wednesday with the wound care center and [facility] where his dermatologist referred him. Obviously I do think a second opinion would be helpful at this point especially in light of the fact that the patient seems to be doing so  poorly despite the fact that we have tried everything that I really know how at this point. The only thing that ever seems to have helped him in the past is when he was on high doses of continual steroids that did seem to make a difference for him. 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 06/07/2019 upon evaluation today patient unfortunately appears to be doing  worse yet again with regard to his wound. In fact I am starting to question whether or not he may have a fluid pocket in the muscle at this point based on the bulging and the soft appearance to the central portion of the muscle area. There is not anything draining from the muscle itself at this time which is good news but nonetheless the wound is expanding. I am not really seeing any results of the Humira as far as overall wound progression based on what I am seeing at this point. The patient has been referred for second opinion with regard to his wound to the [facility] wound care center by his dermatologist which I definitely am not in opposition to. Unfortunately we tried multiple dressings in the past including collagen, alginate, and at one point even Hydrofera Blue. With that being said he is never really used it for any significant amount of time due to the fact that he often complains of pain associated with these dressings and then will go back to either using the Santyl which she has done intermittently or more frequently the triamcinolone. He is also using his own compression stockings. We have wrapped him in the past but again that was something else that he really was not a big fan of. 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 He seemed to do much better when he was on the steroids although honestly I understand that the steroids are not necessarily the best medication to be on long-term obviously 06/14/2019 on evaluation today patient appears to be doing actually a little bit better with regard to the overall appearance with his leg. Unfortunately he does continue to have  issues with what appears to be some fluid underneath the muscle although he did see the wound specialty center at [facility] last week their main goals were to see about infusion therapy in place of the Humira as they feel like that is not quite strong enough. They also recommended that we continue with the treatment otherwise as we are they felt like that was appropriate and they are okay with him continuing to follow-up here with [place] in that regard. With that being said they are also sending him to the vein specialist there to see about vein stripping and if that would be of benefit for him. Subsequently they also did not really address whether or not an ultrasound of the muscle area to see if there is anything that needs to be addressed here would be appropriate or not. For that reason I discussed this with him last  week I think we may proceed down that road at this point. 06/21/2019 upon evaluation today patient's wound actually appears to be doing slightly better compared to previous evaluations. I do believe that he has made a difference with regard to the progression here with the use of oral steroids. Again in the past has been the only thing that is really calm things down. He does tell me that from [facility] is gotten a good news from there that there are no further vein stripping that is necessary at this point. I do not have that available for review today although the patient did relay this to me. He also did obtain and have the ultrasound of the wound completed which I did sign off on today. It does appear that there is no fluid collection under the muscle this is likely then just edematous tissue in general. That is also good news. Overall I still believe the inflammation is the main issue here. He did inquire about the possibility of a wound VAC again with the muscle protruding like it is I am not really sure whether the wound VAC is necessarily ideal or not. That is something we will have to consider  although I do believe he may need compression wrapping to try to help with edema control which could potentially be of benefit. 06/28/2019 on evaluation today patient appears to be doing slightly better measurement wise although this is not terribly smaller he least seems to be trending towards that direction. With that being said he still seems to have purulent drainage noted in the wound bed at this time. He has been on Levaquin followed by Cipro over the past month. Unfortunately he still seems to have some issues with active infection at this time. I did perform a culture last week in order to evaluate and see if indeed there was still anything going on. Subsequently the culture did come back showing Pseudomonas which is consistent with the drainage has been having which is blue-green in color. He also has had an odor that again was somewhat consistent with Pseudomonas as well. Long story short it appears that the culture showed an intermediate finding with regard to how well the Cipro will work for the Pseudomonas infection. Subsequently being that he does not seem to be clearing up and at best what we are doing is just keeping this at [place] I think he may need to see infectious disease to discuss IV antibiotic options. [name], [name] ([id number]) 07/05/2019 upon evaluation today patient appears to be doing okay in regard to his leg ulcer. He has been tolerating the dressing changes at this point without complication. Fortunately there is no signs of active infection at this time which is good news. No fevers, chills, nausea, vomiting, or diarrhea. With that being said he does have an appointment with infectious disease tomorrow and his primary care on Wednesday. Again the reason for the infectious disease referral was due to the fact that he did not seem to be fully resolving with the use of oral antibiotics and therefore we were thinking that IV antibiotic therapy may be necessary secondary to the  fact that there was an intermediate finding for how effective the Cipro may be. Nonetheless again he has been having a lot of purulent and even green drainage. Fortunately right now that seems to have calmed down over the past week with the reinitiation of the oral antibiotic. Nonetheless we will see what Dr. Megan Salon has to say. 07/12/2019 upon evaluation today  patient appears to be doing about the same at this point in regard to his left lower extremity ulcer. Fortunately there is no signs of active infection at this time which is good news I do believe the Levaquin has been beneficial I did review Dr. Hale Bogus note and to be honest I agree that the patient's leg does appear to be doing better currently. What we found in the past as he does not seem to really completely resolve he will stop the antibiotic and then subsequently things will revert back to having issues with blue-green drainage, increased pain, and overall worsening in general. Obviously that is the reason I sent him back to infectious disease. 07/19/2019 upon evaluation today patient appears to be doing roughly the same in size there is really no dramatic improvement. He has started back on the Levaquin at this point and though he seems to be doing okay he did still have a lot of blue/green drainage noted on evaluation today unfortunately. I think that this is still indicative more likely of a Pseudomonas infection as previously noted and again he does see Dr. Megan Salon in just a couple of days. I do not know that were really able to effectively clear this with just oral antibiotics alone based on what I am seeing currently. Nonetheless we are still continue to try to manage as best we can with regard to the patient and his wound. I do think the wrap was helpful in decreasing the edema which is excellent news. No fevers, chills, nausea, vomiting, or diarrhea. 07/26/2019 upon evaluation today patient appears to be doing slightly better with  regard to the overall appearance of the muscle there is no dark discoloration centrally. Fortunately there is no signs of active infection at this time. No fevers, chills, nausea, vomiting, or diarrhea. Patient's wound bed currently the patient did have an appointment with Dr. Megan Salon at infectious disease last week. With that being said Dr. Megan Salon the patient states was still somewhat hesitant about put him on any IV antibiotics he wanted [place] to repeat cultures today and then see where things go going forward. He does look like Dr. Megan Salon because of some improvement the patient did have with the Levaquin wanted [place] to see about repeating cultures. If it indeed grows the Pseudomonas again then he recommended a possibility of considering a PICC line placement and IV antibiotic therapy. He plans to see the patient back in 1 to 2 weeks. 08/02/2019 upon evaluation today patient appears to be doing poorly with regard to his left lower extremity. We did get the results of his culture back it shows that he is still showing evidence of Pseudomonas which is consistent with the purulent/blue-green drainage that he has currently. Subsequently the culture also shows that he now is showing resistance to the oral fluoroquinolones which is unfortunate as that was really the only thing to treat the infection prior. I do believe that he is looking like this is going require IV antibiotic therapy to get this under control. Fortunately there is no signs of systemic infection at this time which is good news. The patient does see Dr. Megan Salon tomorrow. 08/09/2019 upon evaluation today patient appears to be doing better with regard to his left lower extremity ulcer in regard to the overall appearance. He is currently on IV antibiotic therapy. As ordered by Dr. Megan Salon. Currently the patient is on ceftazidime which she is going to take for the next 2 weeks and then follow-up for 4 to 5-week  appointment with Dr. Megan Salon.  The patient started this this past Friday symptoms have not for a total of 3 days currently in full. 08/16/2019 upon evaluation today patient's wound actually does show muscle in the base of the wound but in general does appear to be much better as far as the overall evidence of infection is concerned. In fact I feel like this is for the most part cleared up he still on the IV antibiotics he has not completed the full course yet but I think he is doing much better which is excellent news. 08/23/2019 upon evaluation today patient appears to be doing about the same with regard to his wound at this point. He tells me that he still has pain unfortunately. Fortunately there is no evidence of systemic infection at this time which is great news. There is significant muscle protrusion. 09/13/19 upon evaluation today patient appears to be doing about the same in regard to his leg unfortunately. He still has a lot of drainage coming from the ulceration there is still muscle exposed. With that being said the patient's last wound culture still showed an intermediate finding with regard to the Pseudomonas he still having the bluish/green drainage as well. Overall I do not know that the wound has completely cleared of infection at this point. Fortunately there is no signs of active infection systemically at this point which is good news. 09/20/2019 upon evaluation today patient's wound actually appears to be doing about the same based on what I am seeing currently. I do not see any signs of systemic infection he still does have evidence of some local infection and drainage. He did see Dr. Megan Salon last week and Dr. Megan Salon states that he probably does need a different IV antibiotic although he does not want to put him on this until the patient begins the Remicade infusion which is actually scheduled for about 10 days out from today on 13 May. Following that time Dr. Megan Salon is good to see him back and then will  evaluate the feasibility of starting him on the IV antibiotic therapy once again at that point. I do not disagree with this plan I do believe as Dr. Megan Salon stated in his note that I reviewed today that the patient's issue is multifactorial with the pyoderma being 1 aspect of this that were hoping the Remicade will be helpful for her. In the meantime I think the gentamicin is, helping to keep things under decent okay control in regard to the ulcer. 09/27/2019 upon evaluation today patient appears to be doing about the same with regard to his wound still there is a lot of muscle exposure though he does have some hyper granulation tissue noted around the edge and actually some granulation tissue starting to form over the muscle which is actually good news. Fortunately there is no evidence of active infection which is also good news. His pain is less at this point. 5/21; this is a patient I have not seen in a long time. 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 He has a large wound on the left lateral leg with protruding muscle. He comes in the clinic today showing the same area on his left medial ankle. He says there is been a spot there for some time although we have not previously defined this. Today he has a clearly defined area with slight amount of skin breakdown surrounded by raised areas with a purplish hue in color. This is not painful he  says it is irritated. 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 He still has muscle protruding from the lateral portion of his left leg but fortunately the new area that they were concerned about at his last visit does not appear to have opened at this point. He is currently on Remicade infusions and seems to be doing better in my opinion in fact the wound itself seems to be overall much better. The purplish  discoloration that he did have seems to have resolved and I think that is a good sign that hopefully the Remicade is doing its job. He does have some biofilm noted over the surface of the wound. 11/01/2019 on evaluation today patient's wound actually appears to be doing excellent at this time. Fortunately there is no evidence of active infection and overall I feel like he is making great progress. The Remicade seems to be due excellent job in my opinion. FONTAINE, HEHL ([id number]) 11/08/19 evaluation today vision actually appears to be doing quite well with regard to his weight ulcer. He's been tolerating dressing changes without complication. Fortunately there is no evidence of infection. No fevers, chills, nausea, or vomiting noted at this time. Overall states that is having more itching than pain which is actually a good sign in my opinion. 12/13/2019 upon evaluation today patient appears to be doing well today with regard to his wound. He has been tolerating the dressing changes without complication. Fortunately there is no sign of active infection at this time. No fevers, chills, nausea, vomiting, or diarrhea. Overall I feel like the infusion therapy has been very beneficial for him. 01/06/2020 on evaluation today patient appears to be doing well with regard to his wound. This is measuring smaller and actually looks to be doing better. Fortunately there is no signs of active infection at this point. No fevers, chills, nausea, vomiting, or diarrhea. With that being said he does still have the blue-green drainage but this does not seem to be causing any significant issues currently. He has been using the gentamicin that does seem to be keeping things under decent control at this point. He goes later this morning for his next infusion therapy for the pyoderma which seems to also be very beneficial. 02/07/2020 on evaluation today patient appears to be doing about the same in regard to his wounds  currently. Fortunately there is no signs of active infection systemically he does still have evidence of local infection still using gentamicin. He also is showing some signs of improvement albeit slowly I do feel like we are making some progress here. 02/21/2020 upon evaluation today patient appears to be making some signs of improvement the wound is measuring a little bit smaller which is great news and overall I am very pleased with where he stands currently. He is going to be having infusion therapy treatment on the 15th of this month. Fortunately there is no signs of active infection at this time. 03/13/2020 I do believe patient's wound is actually showing some signs of improvement here which is great news. He has continue with the infusion therapy through rheumatology[facility]matology at [facility]. That does seem to be beneficial. I still think he gets as much benefit from this as he did from the prednisone initially but nonetheless obviously this is less harsh on his body that the prednisone as far as they are concerned. 03/31/2020 on evaluation today patient's wound actually showing signs of some pretty  good improvement in regard to the overall appearance of the wound bed. There is still muscle exposed though he does have some epithelial growth around the edges of the wound. Fortunately there is no signs of active infection at this time. No fevers, chills, nausea, vomiting, or diarrhea. 04/24/2020 upon evaluation today patient appears to be doing about the same in regard to his leg ulcer. He has been tolerating the dressing changes without complication. Fortunately there is no signs of active infection at this time. No fevers, chills, nausea, vomiting, or diarrhea. With that being said he still has a lot of irritation from the bandaging around the edges of the wound. We did discuss today the possibility of a referral to plastic surgery. 05/22/2020 on evaluation today patient appears to be doing well with  regard to his wounds all things considered. He has not been able to get the Chantix apparently there is a recall nurse that I was unaware of put out by [employer] involuntarily. Nonetheless for now I am and I have to do some research into what may be the best option for him to help with quitting in regard to smoking and we discussed that today. 06/26/2020 upon evaluation today patient appears to be doing well with regard to his wound from the standpoint of infection I do not see any signs of infection at this point. With that being said unfortunately he is still continuing to have issues with muscle exposure and again he is not having a whole lot of new skin growth unfortunately. There does not appear to be any signs of active infection at this time. No fevers, chills, nausea, vomiting, or diarrhea. 07/10/2020 upon evaluation today patient appears to be doing a little bit more poorly currently compared to where he was previous. I am concerned currently about an active infection that may be getting worse especially in light of the increased size and tenderness of the wound bed. No fevers, chills, nausea, vomiting, or diarrhea. 07/24/2020 upon evaluation today patient appears to be doing poorly in regard to his leg ulcer. He has been tolerating the dressing changes without complication but unfortunately is having a lot of discomfort. 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 Subsequently I think he is going require possibly IV antibiotics to get this under control. I am very concerned about the severity of his infection and the amount of discomfort he is having. 07/31/2020 upon evaluation today patient appears to be doing about the same in regard to his leg wound. He did see Dr. Megan Salon and Dr. Megan Salon is actually going to start him on IV antibiotics. He goes for the PICC line tomorrow. With that being said there do not have that run for  2 weeks and then see how things are doing and depending on how he is progressing they may extend that a little longer. Nonetheless I am glad this is getting ready to be in place and definitely feel it may help the patient. In the meantime is been using mainly triamcinolone to the wound bed has an anti-inflammatory. Electronic Signature(s) Signed: 07/31/2020 9:04:36 AM By: Worthy Keeler PA-C Entered By: Worthy Keeler on 07/31/2020 09:04:36 [name], [name] ([id number]) -------------------------------------------------------------------------------- Physical Exam Details Patient Name: [name] Date of Service: 07/31/2020 8:00 AM Medical Record Number: [id number] Patient Account Number: [id number] Date of Birth/Sex: [birth year] (39 y.o. M) Treating RN: Carlene Coria Primary Care Provider: [name] Other Clinician: Jeanine Luz Referring Provider: [name] Treating  Provider/Extender: Jeri Cos Weeks in Treatment: 67 Constitutional Well-nourished and well-hydrated in no acute distress. Respiratory normal breathing without difficulty. Psychiatric this patient is able to make decisions and demonstrates good insight into disease process. Alert and Oriented x 3. pleasant and cooperative. Notes On inspection today patient's wound bed still showed signs of fairly significant inflammation unfortunately. There does not appear to be any evidence of infection systemically though locally I do not see evidence of infection here. There does not appear to be any sign that this is worsening in general compared to last week although it still is not significantly better. Electronic Signature(s) Signed: 07/31/2020 9:05:23 AM By: Worthy Keeler PA-C Entered By: Worthy Keeler on 07/31/2020 09:05:23 [name], [name] ([id number]) -------------------------------------------------------------------------------- Physician Orders Details Patient Name: [name] Date of Service: 07/31/2020  8:00 AM Medical Record Number: [id number] Patient Account Number: [id number] Date of Birth/Sex: 1980/08/23 (39 y.o. 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 Edema Control - Lymphedema / Segmental Compressive Device / Other o Tubigrip double layer applied - F Wound Treatment Wound #1 - Lower Leg Wound Laterality: Left, Lateral Cleanser: Soap and Water 1 x Per Day/30 Days Discharge Instructions: Gently cleanse wound with antibacterial soap, rinse and pat dry prior to dressing wounds Topical: Triamcinolone Acetonide Cream, 0.1%, 15 (g) tube 1 x Per Day/30 Days Discharge Instructions: to wound bed , lightly Primary Dressing: Santyl Collagenase Ointment, 30 (gm), tube 1 x Per Day/30 Days Discharge Instructions: apply to 8oclock area Secondary Dressing: Mepilex Border Flex, 6x6 (in/in) 1 x Per Day/30 Days Discharge Instructions: Apply to wound as directed. Do not cut. Electronic Signature(s) Signed: 07/31/2020 4:25:37 PM By: Worthy Keeler PA-C Signed: 08/01/2020 4:27:39 PM By: Carlene Coria RN Entered By: Carlene Coria on 07/31/2020 08:33:33 OTONIEL, MYHAND ([id number]) -------------------------------------------------------------------------------- Problem List Details Patient Name: [name] Date of Service: 07/31/2020 8:00 AM Medical Record Number:  [id number] Patient Account Number: [id number] Date of Birth/Sex: November 08, 1980 (39 y.o. M) Treating RN: Carlene Coria Primary Care Provider: [name] Other Clinician: Jeanine Luz Referring Provider: [name] Treating Provider/Extender: [name] Cliche in Treatment: 190 Active Problems ICD-10 Encounter Code Description Active Date MDM Diagnosis L97.222 Non-pressure chronic ulcer of left calf with fat layer exposed 12/04/2016 No Yes L88 Pyoderma gangrenosum 03/26/2017 No Yes L97.321 Non-pressure chronic ulcer of left ankle limited to breakdown of skin 10/08/2019 No Yes I87.2 Venous insufficiency (chronic) (peripheral) 12/04/2016 No Yes L03.116 Cellulitis of left lower limb 05/24/2019 No Yes F17.208 Nicotine dependence, unspecified, with other nicotine-induced disorders 04/24/2020 No Yes Inactive Problems ICD-10 Code Description Active Date Inactive Date L97.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 M) Treating RN: Carlene Coria Primary Care Provider: [name]  Other Clinician: Jeanine Luz Referring Provider: [name] Treating Provider/Extender: [name] Cliche in Treatment: 190 Subjective Chief Complaint Information obtained from Patient He is here in follow up evaluation for LLE pyoderma ulcer History of Present Illness (HPI) 12/04/16; 40 year old man who comes into the clinic today for review of a wound on the posterior left calf. He tells me that is been there  for about a year. He is not a diabetic he does smoke half a pack per day. He was seen in the ER on 11/20/16 felt to have cellulitis around the wound and was given clindamycin. An x-ray did not show osteomyelitis. The patient initially tells me that he has a milk allergy that sets off a pruritic itching rash on his lower legs which she scratches incessantly and he thinks that's what may have set up the wound. He has been using various topical antibiotics and ointments without any effect. He works in a [employer]ing [employer] and is on his feet all day. He does not have a prior history of wounds however he does have the rash on both lower legs the right arm and the ventral aspect of his left arm. These are excoriations and clearly have had scratching however there are of macular looking areas on both legs including a substantial larger area on the right leg. This does not have an underlying open area. There is no blistering. The patient tells me that 2 years ago in [place] in response to the rash on his legs he saw a dermatologist who told him he had a condition which may be pyoderma gangrenosum although I may be putting words into his mouth. He seemed to recognize this. 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 He is not in any treatment for this. He's had no recent travel 12/11/16; the patient arrives today with his wound and roughly the same condition we've been using silver alginate this is a deep punched out wound with some surrounding erythema but no tenderness. Biopsy I did did not show confirmed pyoderma gangrenosum suggested nonspecific inflammation and vasculitis but does not provide an actual description of what was seen by the pathologist. I'm really not able to understand this We have also received information from the patient's dermatologist in [place] notes from April 2016. This was a doctor Agarwal-antal. 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  He was prescribed topical steroid high potency under occlusion which helped but at this point the patient did not have a deep punched out wound. 12/18/16; the patient's wound is larger in terms of surface area however this surface looks better and there is less depth. The surrounding erythema also is better. The patient states that the wrap we put on came off 2 days ago when he has been using his compression stockings. He we are in the process of getting a dermatology consult. 12/26/16 on evaluation today patient's left lower extremity wound shows evidence of infection with surrounding erythema noted. He has been tolerating the dressing changes but states that he has noted more discomfort. There is a larger area of erythema surrounding the wound. No fevers, chills, nausea, or vomiting noted at this time. With that being said the wound still does have slough covering the surface. He is not allergic to any medication that he is aware of at this point. In regard to his right lower extremity he had several regions that are erythematous and pruritic he wonders if there's anything we can do  to help that. 01/02/17 I reviewed patient's wound culture which was obtained his visit last week. He was placed on doxycycline at that point. Unfortunately that does not appear to be an antibiotic that would likely help with the situation however the pseudomonas noted on culture is sensitive to Cipro. Also unfortunately patient's wound seems to have a large compared to last week's evaluation. Not severely so but there are definitely increased measurements in general. He is continuing to have discomfort as well he writes this to be a seven out of 10. In fact he would prefer me not to perform any debridement today due to the fact that he is having discomfort and considering he has an active infection on the little reluctant to do so anyway. No fevers, chills, nausea, or vomiting noted at this time. 01/08/17; patient seems  dermatology on September 5. I suspect dermatology will want the slides from the biopsy I did sent to their pathologist. I'm not sure if there is a way we can expedite that. In any case the culture I did before I left on vacation 3 weeks ago showed Pseudomonas he was given 10 days of Cipro and per her description of her intake nurses is actually somewhat better this week although the wound is quite a bit bigger than I remember the last time I saw this. He still has 3 more days of Cipro 01/21/17; dermatology appointment tomorrow. He has completed the ciprofloxacin for Pseudomonas. Surface of the wound looks better however he is had some deterioration in the lesions on his right leg. Meantime the left lateral leg wound we will continue with sample 01/29/17; patient had his dermatology appointment but I can'[name] yet see that note. He is completed his antibiotics. The wound is more superficial but considerably larger in circumferential area than when he came in. This is in his left lateral calf. He also has swollen erythematous areas with superficial wounds on the right leg and small papular areas on both arms. There apparently areas in her his upper thighs and buttocks I did not look at those. Dermatology biopsied the right leg. Hopefully will have their input next week. 02/05/17; patient went back to see his dermatologist who told him that he had a "scratching problem" as well as staph. He is now on a 30 day course of doxycycline and I believe she gave him triamcinolone cream to the right leg areas to help with the itching [not exactly sure but probably triamcinolone]. She apparently looked at the left lateral leg wound although this was not rebiopsied and I think felt to be ultimately part of the same pathogenesis. He is using sample border foam and changing nevus himself. He now has a new open area on the right posterior leg which was his biopsy site I don'[name] have any of the dermatology notes 02/12/17; we put  the patient in compression last week with SANTYL to the wound on the left leg and the biopsy. Edema is much better and the depth of the wound is now at level of skin. Area is still the same Biopsy site on the right lateral leg we've also been using santyl with a border foam dressing and he is changing this himself. 02/19/17; Using silver alginate started last week to both the substantial left leg wound and the biopsy site on the right wound. He is tolerating compression well. Has a an appointment with his primary M.D. tomorrow wondering about diuretics although I'm wondering if the edema problem is actually lymphedema [name],  [name] ([id number]) 02/26/17; the patient has been to see his primary doctor Dr. Jerrel Ivory at [facility] our primary care. She started him on Lasix 20 mg and this seems to have helped with the edema. However we are not making substantial change with the left lateral calf wound and inflammation. The biopsy site on the right leg also looks stable but not really all that different. 03/12/17; the patient has been to see vein and vascular Dr. Lucky Cowboy. He has had venous reflux studies I have not reviewed these. I did get a call from his dermatology office. 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 They therefore recommended clobetasol and tetracycline i.e. 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 03/26/17; apparently the patient just had reflux studies not an appointment with Dr. dew. She arrives in clinic today having applied clobetasol for 2-3 weeks. He notes over the last 2-3 days excessive drainage having to change the dressing 3-4 times a day and also expanding erythema. 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 Lillia Carmel at [facility] dermatology locally who kindly saw him at my request. 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 My concern was the degree of erythema medially extending from his left leg wound which was either inflammation from pyoderma or cellulitis. I put him on Augmentin however culture of the wound showed Pseudomonas which is quinolone sensitive. I really don'[name] believe he has cellulitis however in view of everything I will continue and give him a course of Cipro. He is also on doxycycline as an immune modulator for the pyoderma. In addition to his original wound on the left lateral leg with surrounding erythema he has a wound on the right posterior calf which was an original biopsy site done by dermatology. 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 Saw Dr. Lillia Carmel yesterday. He has been using topical antibiotics to both wound areas his original wound on the left and the biopsies/pathergy area on the right. There is definitely some improvement in the inflammation around the wound on the right although the patient states he has increasing sensitivity of the wounds. He is on prednisone 60 and doxycycline 1 as prescribed by Dr. Lillia Carmel. He is covering the topical antibiotic with gauze and putting this in his own compression stocks and changing this daily. He states that Dr. Lottie Rater did a culture of the left leg wound yesterday 05/07/17; pyoderma gangrenosum. The patient saw Dr. Lillia Carmel yesterday and has a follow-up with her in one month. He is still using topical antibiotics to both wounds although he can'[name]  recall exactly what type. He is still on prednisone 60 mg. Dr. Lillia Carmel stated that the doxycycline could stop if we were in agreement. 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 The right medial leg was induced by biopsy/pathergy. The area on  the right is essentially healed. 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 The right medial leg has closed and remains closed. He is still on prednisone 60. He tells me he missed his last dermatology appointment with Dr. Lillia Carmel but will make another appointment. He reports that her blood sugar at a recent screen in [place] was high 200's. He was 180 today. 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 I think he is going to require still much longer prednisone perhaps another 3 months before attempting to taper. In the meantime his wound is a lot better. Smaller. He is cleaning this off daily and applying topical antibiotics. When he was last in the clinic I thought about changing to [facility] and actually put in a couple of calls to dermatology although probably not during their business hours. In any case the wound looks better smaller I don'[name] think there is any need to change what he is doing 08/06/17-he is here in follow up evaluation for pyoderma left leg ulcer. He continues on oral prednisone. He has been using triple antibiotic ointment. There is surface debris and we will transition to [facility] and have him return in 2 weeks. He has lost 30 pounds since his last appointment with lifestyle modification. He may benefit from topical steroid cream for treatment this can be considered at a later date. 08/22/17 on evaluation today patient appears to actually be doing rather well in regard to his left lateral lower extremity ulcer. He has actually been managed by  Dr. Dellia Nims most recently. Patient is currently on oral steroids at this time. This seems to have been of benefit for him. Nonetheless his last visit was actually with Leah on 08/06/17. Currently he is not utilizing any topical steroid creams although this could be of benefit as well. No fevers, chills, nausea, or vomiting noted at this time. 09/05/17 on evaluation today patient appears to be doing better in regard to his left lateral lower extremity ulcer. He has been tolerating the dressing changes without complication. He is using Santyl with good effect. Overall I'm very pleased with how things are standing at this point. Patient likewise is happy that this is doing better. 09/19/17 on evaluation today patient actually appears to be doing rather well in regard to his left lateral lower extremity ulcer. 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 He's not having any significant pain. 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 He has been tolerating the Santyl without complication and in general I feel like he's making good progress. 10/17/17 on evaluation today patient appears to be doing very well in regard to his left lateral lower surety ulcer. He has been tolerating the dressing changes without complication. There does not appear to be any evidence of infection he's alternating the Santyl and the triple antibiotic ointment every other day this seems to be doing well for him. 11/03/17 on evaluation today patient appears to be doing very well in regard to his left lateral lower extremity ulcer. He is been tolerating the dressing changes without complication which is good news. Fortunately there does not appear to be any evidence of infection which is also great news. Overall is doing excellent they are starting to taper  down on the  prednisone is down to 40 mg at this point it also started topical clobetasol for him. 11/17/17 on evaluation today patient appears to be doing well in regard to his left lateral lower surety ulcer. He's been tolerating the dressing changes without complication. He does note that he is having no pain, no excessive drainage or discharge, and overall he feels like things are going about how he would expect and hope they would. Overall he seems to have no evidence of infection at this time in my opinion which is good news. 12/04/17-He is seen in follow-up evaluation for right lateral lower extremity ulcer. He has been applying topical steroid cream. Today's measurement show slight increase in size. Over the next 2 weeks we will transition to every other day Santyl and steroid cream. He has been encouraged to monitor for changes and notify clinic with any concerns 12/15/17 on evaluation today patient's left lateral motion the ulcer and fortunately is doing worse again at this point. This just since last week to this week has close to doubled in size according to the patient. I did not seeing last week's I do not have a visual to compare this to in our system was also down so we do not have all the charts and at this point. Nonetheless it does have me somewhat concerned in regard to the fact that again he was worried enough about it he has contact the dermatology that placed them back on the full strength, 50 mg a day of the prednisone that he was taken previous. He continues to alternate using clobetasol along with Santyl at this point. He is obviously somewhat frustrated. [name], [name] ([id number]) 12/22/17 on evaluation today patient appears to be doing a little worse compared to last evaluation. Unfortunately the wound is a little deeper and slightly larger than the last week's evaluation. With that being said he has made some progress in regard to the irritation surrounding at this  time unfortunately despite that progress that's been made he still has a significant issue going on here. 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 For that reason I discussed with him today the possibility of perform a wound culture to ensure there's nothing overtly infected. 01/06/18 on evaluation today patient's wound is larger and deeper than previously evaluated. With that being said it did appear that his wound was infected after my last evaluation with him. Subsequently I did end up prescribing a prescription for Bactrim DS which she has been taking and having no complication with. Fortunately there does not appear to be any evidence of infection at this point in time as far as anything spreading, no want to touch, and overall I feel like things are showing signs of improvement. 01/13/18 on evaluation today patient appears to be even a little larger and deeper than last time. There still muscle exposed in the base of the wound. Nonetheless he does appear to be less erythematous I do believe inflammation is calming down also believe the infection looks like it's probably resolved at this time based on what I'm seeing. No fevers, chills, nausea, or vomiting noted at this time. 01/30/18 on evaluation today patient actually appears to visually look better for the most part. Unfortunately those visually this looks better he does seem to potentially have what may be an abscess in the muscle that has been noted in the  central portion of the wound. This is the first time that I have noted what appears to be fluctuance in the central portion of the muscle. With that being said I'm somewhat more concerned about the fact that this might indicate an abscess formation at this location. I do believe that an ultrasound would be appropriate. This is  likely something we need to try to do as soon as possible. He has been switch to mupirocin ointment and he is no longer using the steroid ointment as prescribed by dermatology he sees them again next week he's been decreased from 60 to 40 mg of prednisone. 03/09/18 on evaluation today patient actually appears to be doing a little better compared to last time I saw him. There's not as much erythema surrounding the wound itself. He I did review his most recent infectious disease note which was dated 02/24/18. He saw Dr. Michel Bickers in [place]. 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 Patient is now off of antibiotics and they are continually observing this. There seems to be no change in the past two weeks in my pinion based on what the patient says and what I see today compared to what Dr. Megan Salon likely saw two weeks ago. No fevers, chills, nausea, or vomiting noted at this time. 03/23/18 on evaluation today patient's wound actually appears to be showing signs of improvement which is good news. He is currently still on the Dapsone. He is also working on tapering the prednisone to get off of this and Dr. Lottie Rater is working with him in this regard. Nonetheless overall I feel like the wound is doing well it does appear based on the infectious disease note that I reviewed from Dr. 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 I did also review the results of his ultrasound of left lower extremity which revealed there was a dentist tissue in the base of the wound without an abscess noted. 04/06/18 on evaluation today the patient's left lateral lower extremity ulcer actually appears to be doing fairly well which is excellent news. There does not appear to be any evidence of infection at this time which  is also great news. Overall he still does have a significantly large ulceration although little by little he seems to be making progress. He is down to 10 mg a day of the prednisone. 04/20/18 on evaluation today patient actually appears to be doing excellent at this time in regard to his left lower extremity ulcer. He's making signs of good progress unfortunately this is taking much longer than we would really like to see but nonetheless he is making progress. Fortunately there does not appear to be any evidence of infection at this time. No fevers, chills, nausea, or vomiting noted at this time. The patient has not been using the Santyl due to the cost he hadn'[name] got in this field yet. He's mainly been using the antibiotic ointment topically. Subsequently he also tells me that he really has not been scrubbing in the shower I think this would be helpful again as I told him it doesn'[name] have to be anything too aggressive to even make it believe just enough to keep it free of some of the loose slough and biofilm on the wound surface. 05/11/18 on evaluation today patient's  wound appears to be making slow but sure progress in regard to the left lateral lower extremity ulcer. He is been tolerating the dressing changes without complication. Fortunately there does not appear to be any evidence of infection at this time. He is still just using triple antibiotic ointment along with clobetasol occasionally over the area. He never got the Santyl and really does not seem to intend to in my pinion. 06/01/18 on evaluation today patient appears to be doing a little better in regard to his left lateral lower extremity ulcer. He states that overall he does not feel like he is doing as well with the Dapsone as he did with the prednisone. Nonetheless he sees his dermatologist later today and is gonna talk to them about the possibility of going back on the prednisone. Overall again I believe that the wound would be better if  you would utilize Santyl but he really does not seem to be interested in going back to the [place] at this point. He has been using triple antibiotic ointment. 06/15/18 on evaluation today patient's wound actually appears to be doing about the same at this point. Fortunately there is no signs of infection at this time. He has made slight improvements although he continues to not really want to clean the wound bed at this point. He states that he just doesn'[name] mess with it he doesn'[name] want to cause any problems with everything else he has going on. He has been on medication, antibiotics as prescribed by his dermatologist, for a staff infection of his lower extremities which is really drying out now and looking much better he tells me. Fortunately there is no sign of overall infection. 06/29/18 on evaluation today patient appears to be doing well in regard to his left lateral lower surety ulcer all things considering. Fortunately his staff infection seems to be greatly improved compared to previous. He has no signs of infection and this is drying up quite nicely. He is still the doxycycline for this is no longer on cental, Dapsone, or any of the other medications. His dermatologist has recommended possibility of an infusion but right now he does not want to proceed with that. 07/13/18 on evaluation today patient appears to be doing about the same in regard to his left lateral lower surety ulcer. Fortunately there's no signs of infection at this time which is great news. Unfortunately he still builds up a significant amount of Slough/biofilm of the surface of the wound he still is not really cleaning this as he should be appropriately. Again I'm able to easily with saline and gauze remove the majority of this on the surface which if you would do this at home would likely be a dramatic improvement for him as far as getting the area to improve. Nonetheless overall I still feel like he is making progress is just  very slow. I think Santyl will be of benefit for him as well. Still he has not gotten this as of this point. 07/27/18 on evaluation today patient actually appears to be doing little worse in regards of the erythema around the periwound region of the wound he also tells me that he's been having more drainage currently compared to what he was experiencing last time I saw him. He states not quite as bad as what he had because this was infected previously but nonetheless is still appears to be doing poorly. Fortunately there is no evidence of systemic infection at this point. The patient tells me that he  is not going to be able to afford the Santyl. He is still waiting to hear about the infusion therapy with his dermatologist. Apparently she wants an updated colonoscopy first. [name], [name] ([id number]) 08/10/18 on evaluation today patient appears to be doing better in regard to his left lateral lower extremity ulcer. Fortunately he is showing signs of improvement in this regard he's actually been approved for Remicade infusion's as well although this has not been scheduled as of yet. Fortunately there's no signs of active infection at this time in regard to the wound although he is having some issues with infection of the right lower extremity is been seen as dermatologist for this. Fortunately they are definitely still working with him trying to keep things under control. 09/07/18 on evaluation today patient is actually doing rather well in regard to his left lateral lower extremity ulcer. He notes these actually having some hair grow back on his extremity which is something he has not seen in years. He also tells me that the pain is really not giving them any trouble at this time which is also good news overall she is very pleased with the progress he's using a combination of the mupirocin along with the probate is all mixed. 09/21/18 on evaluation today patient actually appears to be doing fairly well all  things considered in regard to his looks from the ulcer. He's been tolerating the dressing changes without complication. Fortunately there's no signs of active infection at this time which is good news he is still on all antibiotics or prevention of the staff infection. He has been on prednisone for time although he states it is gonna contact his dermatologist and see if she put them on a short course due to some irritation that he has going on currently. Fortunately there's no evidence of any overall worsening this is going very slow I think cental would be something that would be helpful for him although he states that $50 for tube is quite expensive. He therefore is not willing to get that at this point. 10/06/18 on evaluation today patient actually appears to be doing decently well in regard to his left lateral leg ulcer. He's been tolerating the dressing changes without complication. Fortunately there's no signs of active infection at this time. Overall I'm actually rather pleased with the progress he's making although it's slow he doesn'[name] show any signs of infection and he does seem to be making some improvement. I do believe that he may need a switch up and dressings to try to help this to heal more appropriately and quickly. 10/19/18 on evaluation today patient actually appears to be doing better in regard to his left lateral lower extremity ulcer. This is shown signs of having much less Slough buildup at this point due to the fact he has been using the [employer]. Obviously this is very good news. The overall size of the wound is not dramatically smaller but again the appearance is. 11/02/18 on evaluation today patient actually appears to be doing quite well in regard to his lower [place] ulcer. A lot of the skin around the ulcer is actually somewhat irritating at this point this seems to be more due to the dressing causing irritation from the adhesive that anything else. Fortunately there is no  signs of active infection at this time. 11/24/18 on evaluation today patient appears to be doing a little worse in regard to his overall appearance of his lower extremity ulcer. There's more erythema and warmth around the  wound unfortunately. He is currently on doxycycline which he has been on for some time. With that being said I'm not sure that seems to be helping with what appears to possibly be an acute cellulitis with regard to his left lower extremity ulcer. No fevers, chills, nausea, or vomiting noted at this time. 12/08/18 on evaluation today patient's wounds actually appears to be doing significantly better compared to his last evaluation. He has been using Santyl along with alternating tripling about appointment as well as the steroid cream seems to be doing quite well and the wound is showing signs of improvement which is excellent news. Fortunately there's no evidence of infection and in fact his culture came back negative with only normal skin flora noted. 12/21/2018 upon evaluation today patient actually appears to be doing excellent with regard to his ulcer. This is actually the best that I have seen it since have been helping to take care of him. It is both smaller as well as less slough noted on the surface of the wound and seems to be showing signs of good improvement with new skin growing from the edges. He has been using just the triamcinolone he does wonder if he can get a refill of that ointment today. 01/04/2019 upon evaluation today patient actually appears to be doing well with regard to his left lateral lower extremity ulcer. With that being said it does not appear to be that he is doing quite as well as last time as far as progression is concerned. There does not appear to be any signs of infection or significant irritation which is good news. With that being said I do believe that he may benefit from switching to a collagen based dressing based on how clean The wound  appears. 01/18/2019 on evaluation today patient actually appears to be doing well with regard to his wound on the left lower extremity. He is not made a lot of progress compared to where we were previous but nonetheless does seem to be doing okay at this time which is good news. There is no signs of active infection which is also good news. My only concern currently is I do wish we can get him into utilizing the collagen dressing his insurance would not pay for the supplies that we ordered although it appears that he may be able to order this through his supply company that he typically utilizes. This is [employer]. Nonetheless he did try to order it during the office visit today and it appears this did go through. We will see if he can get that it is a different brand but nonetheless he has collagen and I do think will be beneficial. 02/01/2019 on evaluation today patient actually appears to be doing a little worse today in regard to the overall size of his wounds. Fortunately there is no signs of active infection at this time. That is visually. Nonetheless when this is happened before it was due to infection. For that reason were somewhat concerned about that this time as well. 02/08/2019 on evaluation today patient unfortunately appears to be doing slightly worse with regard to his wound upon evaluation today. Is measuring a little deeper and a little larger unfortunately. I am not really sure exactly what is causing this to enlarge he actually did see his dermatologist she is going to see about initiating Humira for him. Subsequently she also did do steroid injections into the wound itself in the periphery. Nonetheless still nonetheless he seems to be getting a  little bit larger he is gone back to just using the steroid cream topically which I think is appropriate. I would say hold off on the collagen for the time being is definitely a good thing to do. Based on the culture results which we finally did  get the final result back regarding it shows staph as the bacteria noted again that can be a normal skin bacteria based on the fact however he is having increased drainage and worsening of the wound measurement wise I would go ahead and place him on an antibiotic today I do believe for this. 02/15/2019 on evaluation today patient actually appears to be doing somewhat better in regard to his ulcer. There is no signs of worsening at this time I did review his culture results which showed evidence of Staphylococcus aureus but not MRSA. Again this could just be more related to the normal skin bacteria although he states the drainage has slowed down quite a bit he may have had a mild infection not just colonization. And was much smaller and then since around10/04/2019 on evaluation today patient appears to be doing unfortunately worse as far as the size of the wound. I really feel like that this is steadily getting larger again it had been doing excellent right at the beginning of September we have seen a steady increase in the area of the wound it is almost 2-1/2 times the size it was on September 1. Obviously this is a bad trend this is not wanting to see. For that reason we went back to using just the topical triamcinolone cream which does seem to help with inflammation. I checked him for bacteria by way of culture and nothing showed positive there. I am considering giving him a short course of a tapering steroid Bralyn Folkert, Jodie ([id number]) today to see if that is can be beneficial for him. The patient is in agreement with giving that a try. 03/08/2019 on evaluation today patient appears to be doing very well in comparison to last evaluation with regard to his lower extremity ulcer. This is showing signs of less inflammation and actually measuring slightly smaller compared to last time every other week over the past month and a half he has been measuring larger larger larger. Nonetheless I do  believe that the issue has been inflammation the prednisone does seem to have been beneficial for him which is good news. No fevers, chills, nausea, vomiting, or diarrhea. 03/22/2019 on evaluation today patient appears to be doing about the same with regard to his leg ulcer. He has been tolerating the dressing changes without complication. With that being said the wound seems to be mostly arrested at its current size but really is not making any progress except for when we prescribed the prednisone. He did show some signs of dropping as far as the overall size of the wound during that interval week. Nonetheless this is something he is not on long-term at this point and unfortunately I think he is getting need either this or else the Humira which his dermatologist has discussed try to get approval for. With that being said he will be seeing his dermatologist on the 11th of this month that is November. 04/19/2019 on evaluation today patient appears to be doing really about the same the wound is measuring slightly larger compared to last time I saw him. He has not been into the office since November 2 due to the fact that he unfortunately had Covid as that his entire family.  He tells me that it was rough but they did pull-through and he seems to be doing much better. Fortunately there is no signs of active infection at this time. No fevers, chills, nausea, vomiting, or diarrhea. 05/10/2019 on evaluation today patient unfortunately appears to be doing significantly worse as compared to last time I saw him. He does tell me that he has had his first dose of Humira and actually is scheduled to get the next one in the upcoming week. With that being said he tells me also that in the past several days he has been having a lot of issues with green drainage she showed me a picture this is more blue-green in color. He is also been having issues with increased sloughy buildup and the wound does appear to be larger  today. Obviously this is not the direction that we want everything to take based on the starting of his Humira. Nonetheless I think this is definitely a result of likely infection and to be honest I think this is probably Pseudomonas causing the infection based on what I am seeing. 05/24/2019 on evaluation today patient unfortunately appears to be doing significantly worse compared to his prior evaluation with me 2 weeks ago. I did review his culture results which showed that he does have Staph aureus as well as Pseudomonas noted on the culture. Nonetheless the Levaquin that I prescribed for him does not appear to have been appropriate and in fact he tells me he is no longer experiencing the green drainage and discharge that he had at the last visit. Fortunately there is no signs of active infection at this time which is good news although the wound has significantly worsened it in fact is much deeper than it was previous. We have been utilizing up to this point triamcinolone ointment as the prescription topical of choice but at this time I really feel like that the wound is getting need to be packed in order to appropriately manage this due to the deeper nature of the wound. Therefore something along the lines of an alginate dressing may be more appropriate. 05/31/2019 upon inspection today patient's wound actually showed signs of doing poorly at this point. Unfortunately he just does not seem to be making any good progress despite what we have tried. He actually did go ahead and pick up the Cipro and start taking that as he was noticing more green drainage he had previously completed the Levaquin that I prescribed for him as well. Nonetheless he missed his appointment for the seventh last week on Wednesday with the wound care center and [facility] where his dermatologist referred him. Obviously I do think a second opinion would be helpful at this point especially in light of the fact that the  patient seems to be doing so poorly despite the fact that we have tried everything that I really know how at this point. The only thing that ever seems to have helped him in the past is when he was on high doses of continual steroids that did seem to make a difference for him. 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 06/07/2019 upon evaluation today patient unfortunately appears to be doing worse yet again with regard to his wound. In fact I am starting to question whether or not he may  have a fluid pocket in the muscle at this point based on the bulging and the soft appearance to the central portion of the muscle area. There is not anything draining from the muscle itself at this time which is good news but nonetheless the wound is expanding. I am not really seeing any results of the Humira as far as overall wound progression based on what I am seeing at this point. The patient has been referred for second opinion with regard to his wound to the [facility] wound care center by his dermatologist which I definitely am not in opposition to. Unfortunately we tried multiple dressings in the past including collagen, alginate, and at one point even Hydrofera Blue. With that being said he is never really used it for any significant amount of time due to the fact that he often complains of pain associated with these dressings and then will go back to either using the Santyl which she has done intermittently or more frequently the triamcinolone. He is also using his own compression stockings. We have wrapped him in the past but again that was something else that he really was not a big fan of. 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 He seemed to do much better when he was on the steroids although honestly I understand that the steroids are not necessarily the best medication to be on long-term obviously 06/14/2019 on evaluation today patient appears to be doing actually a little bit better with regard to the overall appearance with his leg. Unfortunately he does continue to have issues with what appears to be some fluid underneath the muscle although he did see the wound specialty center at [facility] last week their main goals were to see about infusion therapy in place of the Humira as they feel like that is not quite strong enough. They also recommended that we continue with the treatment otherwise as we are they felt like that was appropriate and they are okay with him continuing to follow-up here with [place] in that regard. With that being said they are also sending him to the vein specialist there to see about vein stripping and if that would be of benefit for him. Subsequently they also did not really address whether or not an ultrasound of the muscle area to see if there is anything that needs to be addressed here would be appropriate or not. For that reason I discussed this with him last week I think we may proceed down that road at this point. 06/21/2019 upon evaluation today patient's wound actually appears to be doing slightly better compared to previous evaluations. I do believe that he has made a difference with regard to the progression here with the use of oral steroids. Again in the past has been the only thing that is really calm things down. He does tell me that from [facility] is gotten a good news from there that there are no further vein stripping that is necessary at this point. I do not have that available for review today although  the patient did relay this to me. He also did obtain and have the ultrasound of the  wound completed which I did sign off on today. It does appear that there is no fluid collection under the muscle this is likely then just edematous tissue in general. That is also good news. Overall I still believe the inflammation is the main issue here. He did inquire about the possibility of a wound VAC again with the muscle protruding like it is I am not really sure whether the wound VAC is necessarily ideal or not. That is something we will have to consider although I do believe he may need compression wrapping to try to help with edema control which could potentially be of benefit. 06/28/2019 on evaluation today patient appears to be doing slightly better measurement wise although this is not terribly smaller he least seems to be trending towards that direction. With that being said he still seems to have purulent drainage noted in the wound bed at this time. He has been on Levaquin followed by Cipro over the past month. Unfortunately he still seems to have some issues with active infection at this time. I did perform a culture last week in order to evaluate and see if indeed there was still anything going on. Subsequently the culture did come back Pittman, Rafay ([id number]) showing Pseudomonas which is consistent with the drainage has been having which is blue-green in color. He also has had an odor that again was somewhat consistent with Pseudomonas as well. Long story short it appears that the culture showed an intermediate finding with regard to how well the Cipro will work for the Pseudomonas infection. Subsequently being that he does not seem to be clearing up and at best what we are doing is just keeping this at [place] I think he may need to see infectious disease to discuss IV antibiotic options. 07/05/2019 upon evaluation today patient appears to be doing okay in regard to his leg ulcer. He has been tolerating  the dressing changes at this point without complication. Fortunately there is no signs of active infection at this time which is good news. No fevers, chills, nausea, vomiting, or diarrhea. With that being said he does have an appointment with infectious disease tomorrow and his primary care on Wednesday. Again the reason for the infectious disease referral was due to the fact that he did not seem to be fully resolving with the use of oral antibiotics and therefore we were thinking that IV antibiotic therapy may be necessary secondary to the fact that there was an intermediate finding for how effective the Cipro may be. Nonetheless again he has been having a lot of purulent and even green drainage. Fortunately right now that seems to have calmed down over the past week with the reinitiation of the oral antibiotic. Nonetheless we will see what Dr. Megan Salon has to say. 07/12/2019 upon evaluation today patient appears to be doing about the same at this point in regard to his left lower extremity ulcer. Fortunately there is no signs of active infection at this time which is good news I do believe the Levaquin has been beneficial I did review Dr. Hale Bogus note and to be honest I agree that the patient's leg does appear to be doing better currently. What we found in the past as he does not seem to really completely resolve he will stop the antibiotic and then subsequently things will revert back to having issues with blue-green drainage, increased pain, and overall worsening in general. Obviously that is the reason I sent him back to infectious disease. 07/19/2019 upon evaluation  today patient appears to be doing roughly the same in size there is really no dramatic improvement. He has started back on the Levaquin at this point and though he seems to be doing okay he did still have a lot of blue/green drainage noted on evaluation today unfortunately. I think that this is still indicative more likely of a  Pseudomonas infection as previously noted and again he does see Dr. Megan Salon in just a couple of days. I do not know that were really able to effectively clear this with just oral antibiotics alone based on what I am seeing currently. Nonetheless we are still continue to try to manage as best we can with regard to the patient and his wound. I do think the wrap was helpful in decreasing the edema which is excellent news. No fevers, chills, nausea, vomiting, or diarrhea. 07/26/2019 upon evaluation today patient appears to be doing slightly better with regard to the overall appearance of the muscle there is no dark discoloration centrally. Fortunately there is no signs of active infection at this time. No fevers, chills, nausea, vomiting, or diarrhea. Patient's wound bed currently the patient did have an appointment with Dr. Megan Salon at infectious disease last week. With that being said Dr. Megan Salon the patient states was still somewhat hesitant about put him on any IV antibiotics he wanted [place] to repeat cultures today and then see where things go going forward. He does look like Dr. Megan Salon because of some improvement the patient did have with the Levaquin wanted [place] to see about repeating cultures. If it indeed grows the Pseudomonas again then he recommended a possibility of considering a PICC line placement and IV antibiotic therapy. He plans to see the patient back in 1 to 2 weeks. 08/02/2019 upon evaluation today patient appears to be doing poorly with regard to his left lower extremity. We did get the results of his culture back it shows that he is still showing evidence of Pseudomonas which is consistent with the purulent/blue-green drainage that he has currently. Subsequently the culture also shows that he now is showing resistance to the oral fluoroquinolones which is unfortunate as that was really the only thing to treat the infection prior. I do believe that he is looking like this is going require  IV antibiotic therapy to get this under control. Fortunately there is no signs of systemic infection at this time which is good news. The patient does see Dr. Megan Salon tomorrow. 08/09/2019 upon evaluation today patient appears to be doing better with regard to his left lower extremity ulcer in regard to the overall appearance. He is currently on IV antibiotic therapy. As ordered by Dr. Megan Salon. Currently the patient is on ceftazidime which she is going to take for the next 2 weeks and then follow-up for 4 to 5-week appointment with Dr. Megan Salon. The patient started this this past Friday symptoms have not for a total of 3 days currently in full. 08/16/2019 upon evaluation today patient's wound actually does show muscle in the base of the wound but in general does appear to be much better as far as the overall evidence of infection is concerned. In fact I feel like this is for the most part cleared up he still on the IV antibiotics he has not completed the full course yet but I think he is doing much better which is excellent news. 08/23/2019 upon evaluation today patient appears to be doing about the same with regard to his wound at this point. He tells  me that he still has pain unfortunately. Fortunately there is no evidence of systemic infection at this time which is great news. There is significant muscle protrusion. 09/13/19 upon evaluation today patient appears to be doing about the same in regard to his leg unfortunately. He still has a lot of drainage coming from the ulceration there is still muscle exposed. With that being said the patient's last wound culture still showed an intermediate finding with regard to the Pseudomonas he still having the bluish/green drainage as well. Overall I do not know that the wound has completely cleared of infection at this point. Fortunately there is no signs of active infection systemically at this point which is good news. 09/20/2019 upon evaluation today patient's  wound actually appears to be doing about the same based on what I am seeing currently. I do not see any signs of systemic infection he still does have evidence of some local infection and drainage. He did see Dr. Megan Salon last week and Dr. Megan Salon states that he probably does need a different IV antibiotic although he does not want to put him on this until the patient begins the Remicade infusion which is actually scheduled for about 10 days out from today on 13 May. Following that time Dr. Megan Salon is good to see him back and then will evaluate the feasibility of starting him on the IV antibiotic therapy once again at that point. I do not disagree with this plan I do believe as Dr. Megan Salon stated in his note that I reviewed today that the patient's issue is multifactorial with the pyoderma being 1 aspect of this that were hoping the Remicade will be helpful for her. In the meantime I think the gentamicin is, helping to keep things under decent okay control in regard to the ulcer. 09/27/2019 upon evaluation today patient appears to be doing about the same with regard to his wound still there is a lot of muscle exposure though he does have some hyper granulation tissue noted around the edge and actually some granulation tissue starting to form over the muscle which is actually good news. Fortunately there is no evidence of active infection which is also good news. His pain is less at this point. 5/21; this is a patient I have not seen in a long time. 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 He has a large wound on the left lateral leg with protruding muscle. He comes in the clinic today showing the same area on his left medial ankle. He says there is been a spot there for some time although we have not previously defined this. Today he has a clearly defined area with slight amount of skin breakdown surrounded by raised areas with a purplish hue in color. This is  not painful he says it is irritated. 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 He still has muscle protruding from the lateral portion of his left leg but fortunately the new area that they were concerned about at his last visit does not appear to have opened at this point. He is currently on Remicade infusions and seems to be doing better in my opinion in fact the wound itself seems to be overall much better. The purplish discoloration that he did have seems to have resolved and I think that is a good sign that hopefully the Remicade is doing its job. He does Tschirhart, Henning (  [id number]) have some biofilm noted over the surface of the wound. 11/01/2019 on evaluation today patient's wound actually appears to be doing excellent at this time. Fortunately there is no evidence of active infection and overall I feel like he is making great progress. The Remicade seems to be due excellent job in my opinion. 11/08/19 evaluation today vision actually appears to be doing quite well with regard to his weight ulcer. He's been tolerating dressing changes without complication. Fortunately there is no evidence of infection. No fevers, chills, nausea, or vomiting noted at this time. Overall states that is having more itching than pain which is actually a good sign in my opinion. 12/13/2019 upon evaluation today patient appears to be doing well today with regard to his wound. He has been tolerating the dressing changes without complication. Fortunately there is no sign of active infection at this time. No fevers, chills, nausea, vomiting, or diarrhea. Overall I feel like the infusion therapy has been very beneficial for him. 01/06/2020 on evaluation today patient appears to be doing well with regard to his wound. This is measuring smaller and actually looks to be doing better. Fortunately there is no signs of  active infection at this point. No fevers, chills, nausea, vomiting, or diarrhea. With that being said he does still have the blue-green drainage but this does not seem to be causing any significant issues currently. He has been using the gentamicin that does seem to be keeping things under decent control at this point. He goes later this morning for his next infusion therapy for the pyoderma which seems to also be very beneficial. 02/07/2020 on evaluation today patient appears to be doing about the same in regard to his wounds currently. Fortunately there is no signs of active infection systemically he does still have evidence of local infection still using gentamicin. He also is showing some signs of improvement albeit slowly I do feel like we are making some progress here. 02/21/2020 upon evaluation today patient appears to be making some signs of improvement the wound is measuring a little bit smaller which is great news and overall I am very pleased with where he stands currently. He is going to be having infusion therapy treatment on the 15th of this month. Fortunately there is no signs of active infection at this time. 03/13/2020 I do believe patient's wound is actually showing some signs of improvement here which is great news. He has continue with the infusion therapy through rheumatology/dermatology at [facility]. That does seem to be beneficial. I still think he gets as much benefit from this as he did from the prednisone initially but nonetheless obviously this is less harsh on his body that the prednisone as far as they are concerned. 03/31/2020 on evaluation today patient's wound actually showing signs of some pretty good improvement in regard to the overall appearance of the wound bed. There is still muscle exposed though he does have some epithelial growth around the edges of the wound. Fortunately there is no signs of active infection at this time. No fevers, chills, nausea, vomiting, or  diarrhea. 04/24/2020 upon evaluation today patient appears to be doing about the same in regard to his leg ulcer. He has been tolerating the dressing changes without complication. Fortunately there is no signs of active infection at this time. No fevers, chills, nausea, vomiting, or diarrhea. With that being said he still has a lot of irritation from the bandaging around the edges of the wound. We did discuss today the possibility  of a referral to plastic surgery. 05/22/2020 on evaluation today patient appears to be doing well with regard to his wounds all things considered. He has not been able to get the Chantix apparently there is a recall nurse that I was unaware of put out by [employer] involuntarily. Nonetheless for now I am and I have to do some research into what may be the best option for him to help with quitting in regard to smoking and we discussed that today. 06/26/2020 upon evaluation today patient appears to be doing well with regard to his wound from the standpoint of infection I do not see any signs of infection at this point. With that being said unfortunately he is still continuing to have issues with muscle exposure and again he is not having a whole lot of new skin growth unfortunately. There does not appear to be any signs of active infection at this time. No fevers, chills, nausea, vomiting, or diarrhea. 07/10/2020 upon evaluation today patient appears to be doing a little bit more poorly currently compared to where he was previous. I am concerned currently about an active infection that may be getting worse especially in light of the increased size and tenderness of the wound bed. No fevers, chills, nausea, vomiting, or diarrhea. 07/24/2020 upon evaluation today patient appears to be doing poorly in regard to his leg ulcer. He has been tolerating the dressing changes without complication but unfortunately is having a lot of discomfort. 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 Subsequently I think he is going require possibly IV antibiotics to get this under control. I am very concerned about the severity of his infection and the amount of discomfort he is having. 07/31/2020 upon evaluation today patient appears to be doing about the same in regard to his leg wound. He did see Dr. Megan Salon and Dr. Megan Salon is actually going to start him on IV antibiotics. He goes for the PICC line tomorrow. With that being said there do not have that run for 2 weeks and then see how things are doing and depending on how he is progressing they may extend that a little longer. Nonetheless I am glad this is getting ready to be in place and definitely feel it may help the patient. In the meantime is been using mainly triamcinolone to the wound bed has an anti-inflammatory. Objective Constitutional Well-nourished and well-hydrated in no acute distress. Vitals Time Taken: 8:10 AM, Height: 71 in, Weight: 338 lbs, BMI: 47.1, Temperature: 98.5 F, Pulse: 76 bpm, Respiratory Rate: 18 breaths/min, Losano, Zaki ([id number]) Blood Pressure: 152/85 mmHg. Respiratory normal breathing without difficulty. Psychiatric this patient is able to make decisions and demonstrates good insight into disease process. Alert and Oriented x 3. pleasant and cooperative. General Notes: On inspection today patient's wound bed still showed signs of fairly significant inflammation unfortunately. There does not appear to be any evidence of infection systemically though locally I do not see evidence of infection here. There does not appear to be any sign that this is worsening in general compared to last week although it still is not significantly better. Integumentary (Hair, Skin) Wound #1 status is Open. Original cause of wound was Gradually Appeared. The date acquired was: 11/18/2015. The wound has been in treatment 190 weeks. The wound is located on the  Left,Lateral Lower Leg. The wound measures 6.5cm length x 6.52cm width x 1.1cm depth; 33.285cm^2 area and 36.614cm^3 volume. There is muscle and Fat Layer (  Subcutaneous Tissue) exposed. There is no tunneling or undermining noted. There is a small amount of purulent drainage noted. The wound margin is epibole. There is medium (34-66%) red, pink granulation within the wound bed. There is a medium (34-66%) amount of necrotic tissue within the wound bed including Adherent Slough. 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 Edema Control - Lymphedema / Segmental Compressive Device / Other: Tubigrip double layer applied - F WOUND #1: - Lower Leg Wound Laterality: Left, Lateral Cleanser: Soap and Water 1 x Per Day/30 Days Discharge Instructions: Gently cleanse wound with antibacterial soap, rinse and pat dry prior to dressing wounds Topical: Triamcinolone Acetonide Cream, 0.1%, 15 (g) tube 1 x Per Day/30 Days Discharge Instructions: to wound bed , lightly Primary Dressing: Santyl Collagenase Ointment, 30 (gm), tube 1 x Per Day/30 Days Discharge Instructions: apply to 8oclock area Secondary Dressing: Mepilex Border Flex, 6x6 (in/in) 1 x Per Day/30 Days Discharge Instructions: Apply to wound as directed. Do not cut. 1. Would recommend currently that we going to continue with the wound care measures as before and the patient is in agreement the plan this includes the use of the triamcinolone to the majority of the wound bed though at 8:00 we will use a little bit of Santyl at this location to help clean up the surface of the wound. 2. I am also can  recommend that the patient continue with the border foam dressing to cover followed by Tubigrip. 3. I am also going to suggest that he continue to monitor for any signs of worsening. If anything develops he should let me know soon as possible. Hopefully get the PICC line and will make a difference to get that established tomorrow. We will see patient back for reevaluation in 1 week here in the clinic. If anything worsens or changes patient will contact our office for additional recommendations. Electronic Signature(s) Signed: 07/31/2020 9:06:13 AM By: Worthy Keeler PA-C Entered By: Worthy Keeler on 07/31/2020 09:06:13 Shanks, Nezar ([id number]) YESHUA, STRYKER ([id number]) -------------------------------------------------------------------------------- SuperBill Details Patient Name: [name] Date of Service: 07/31/2020 Medical Record Number: [id number] Patient Account Number: [id number] Date of Birth/Sex: June 20, 1980 (40 y.o. M) Treating RN: Carlene Coria Primary Care Provider: [name] Other Clinician: Jeanine Luz Referring Provider: [name] Treating Provider/Extender: [name] Cliche in Treatment: 190 Diagnosis Coding ICD-10 Codes Code Description [phone] Non-pressure chronic ulcer of left calf with fat layer exposed L88 Pyoderma gangrenosum L97.321 Non-pressure chronic ulcer of left ankle limited to breakdown of skin I87.2 Venous insufficiency (chronic) (peripheral) L03.116 Cellulitis of left lower limb F17.208 Nicotine dependence, unspecified, with other nicotine-induced disorders Facility Procedures CPT4 Code: [id number] Description: 99213 - WOUND CARE VISIT-LEV 3 EST PT Modifier: Quantity: 1 Physician Procedures CPT4 Code: 8088110 Description: 31594 - WC PHYS LEVEL 3 - EST PT Modifier: Quantity: 1 CPT4 Code: Description: ICD-10 Diagnosis Description L97.222 Non-pressure chronic ulcer of left calf with fat layer exposed L88 Pyoderma gangrenosum  L97.321 Non-pressure chronic ulcer of left ankle limited to breakdown of skin I87.2 Venous insufficiency (chronic)  (peripheral) Modifier: Quantity: Electronic Signature(s) Signed: 07/31/2020 9:06:24 AM By: Worthy Keeler PA-C Entered By: Worthy Keeler on 07/31/2020 58:59:29

## 2020-08-01 ENCOUNTER — Ambulatory Visit (HOSPITAL_COMMUNITY)
Admission: RE | Admit: 2020-08-01 | Discharge: 2020-08-01 | Disposition: A | Discharge status: Home / Self Care | Payer: 59 | Source: Ambulatory Visit | Attending: Internal Medicine | Admitting: Internal Medicine

## 2020-08-01 ENCOUNTER — Other Ambulatory Visit: Payer: Self-pay | Admitting: Internal Medicine

## 2020-08-01 DIAGNOSIS — L88 Pyoderma gangrenosum: Secondary | ICD-10-CM | POA: Insufficient documentation

## 2020-08-01 DIAGNOSIS — T148XXA Other injury of unspecified body region, initial encounter: Secondary | ICD-10-CM

## 2020-08-01 DIAGNOSIS — L089 Local infection of the skin and subcutaneous tissue, unspecified: Secondary | ICD-10-CM

## 2020-08-01 MED ORDER — HEPARIN SOD (PORK) LOCK FLUSH 100 UNIT/ML IV SOLN
INTRAVENOUS | Status: AC
  Administered 2020-08-01: 250 [IU] via INTRAVENOUS
  Filled 2020-08-01: qty 5

## 2020-08-01 MED ORDER — LIDOCAINE HCL 1 % IJ SOLN
INTRAMUSCULAR | Status: AC
  Filled 2020-08-01: qty 20

## 2020-08-01 MED ORDER — SODIUM CHLORIDE 0.9 % IV SOLN
2.0000 g | Freq: Once | INTRAVENOUS | Status: AC
  Administered 2020-08-01: 2 g via INTRAVENOUS
  Filled 2020-08-01: qty 2

## 2020-08-01 MED ORDER — LIDOCAINE HCL 1 % IJ SOLN
INTRAMUSCULAR | Status: DC | PRN
  Administered 2020-08-01: 5 mL

## 2020-08-01 MED ORDER — HEPARIN SOD (PORK) LOCK FLUSH 100 UNIT/ML IV SOLN: 250.0000 [IU] | Freq: Once | INTRAVENOUS | Status: AC

## 2020-08-01 NOTE — Procedures (Signed)
PROCEDURE SUMMARY:  Successful placement of single lumen PICC line to right brachial vein. Length 45cm Tip at lower SVC/RA No complications PICC capped Ready for use. EBL = trace  Please see full dictation in Imaging section for details.   Prabhjot Maddux S Chance Karam PA-C 08/01/2020 2:22 PM

## 2020-08-01 NOTE — Progress Notes (Signed)
[name], [name] ([id number]) Visit Report for 07/31/2020 Arrival Information Details Patient Name: [name], [name] Date of Service: 07/31/2020 8:00 AM Medical Record Number: [id number] Patient Account Number: [id number] Date of Birth/Sex: 10-Mar-1981 (40 y.o. M) Treating RN: [name] Primary Care [name]: [name] Other Clinician: Jeanine [name] Referring [name]: [name] Treating [name]/Extender: [name] in Treatment: 44 Visit Information History Since Last Visit Added or deleted any medications: No Patient Arrived: Ambulatory Had a fall or experienced change in No Arrival Time: 08:11 activities of daily living that may affect Accompanied By: self risk of falls: Transfer Assistance: None Hospitalized since last visit: No Patient Identification Verified: Yes Pain Present Now: Yes Secondary Verification Process Completed: Yes Patient Requires Transmission-Based Precautions: No Patient Has Alerts: Yes Electronic Signature(s) Signed: 07/31/2020 11:47:12 AM By: [name] Entered By: [name] on 07/31/2020 08:12:10 [name] ([id number]) -------------------------------------------------------------------------------- Clinic Level of Care Assessment Details Patient Name: [name] Date of Service: 07/31/2020 8:00 AM Medical Record Number: [id number] Patient Account Number: [id number] Date of Birth/Sex: [birth year] (40 y.o. M) Treating RN: [name] Primary Care [name]: [name] Other Clinician: Jeanine [name] Referring [name]: [name] Treating [name]/Extender: [name] in Treatment: 190 Clinic Level of Care Assessment Items TOOL 4 Quantity Score X - Use when only an EandM is performed on FOLLOW-UP visit 1 0 ASSESSMENTS - Nursing Assessment / Reassessment X - Reassessment of Co-morbidities (includes updates in patient status) 1 10 X- 1 5 Reassessment of Adherence to Treatment Plan ASSESSMENTS -  Wound and Skin Assessment / Reassessment X - Simple Wound Assessment / Reassessment - one wound 1 5 []  - 0 Complex Wound Assessment / Reassessment - multiple wounds []  - 0 Dermatologic / Skin Assessment (not related to wound area) ASSESSMENTS - Focused Assessment []  - Circumferential Edema Measurements - multi extremities 0 []  - 0 Nutritional Assessment / Counseling / Intervention []  - 0 Lower Extremity Assessment (monofilament, tuning fork, pulses) []  - 0 Peripheral Arterial Disease Assessment (using hand held doppler) ASSESSMENTS - Ostomy and/or Continence Assessment and Care []  - Incontinence Assessment and Management 0 []  - 0 Ostomy Care Assessment and Management (repouching, etc.) PROCESS - Coordination of Care X - Simple Patient / Family Education for ongoing care 1 15 []  - 0 Complex (extensive) Patient / Family Education for ongoing care X- 1 10 Staff obtains Consents, Records, Test Results / Process Orders []  - 0 Staff telephones HHA, Nursing Homes / Clarify orders / etc []  - 0 Routine Transfer to another Facility (non-emergent condition) []  - 0 Routine Hospital Admission (non-emergent condition) []  - 0 New Admissions / Biomedical engineer / Ordering NPWT, Apligraf, etc. []  - 0 Emergency Hospital Admission (emergent condition) X- 1 10 Simple Discharge Coordination []  - 0 Complex (extensive) Discharge Coordination PROCESS - Special Needs []  - Pediatric / Minor Patient Management 0 []  - 0 Isolation Patient Management []  - 0 Hearing / Language / Visual special needs []  - 0 Assessment of Community assistance (transportation, D/C planning, etc.) []  - 0 Additional assistance / Altered mentation []  - 0 Support Surface(s) Assessment (bed, cushion, seat, etc.) INTERVENTIONS - Wound Cleansing / Measurement [name], [name] ([id number]) X- 1 5 Simple Wound Cleansing - one wound []  - 0 Complex Wound Cleansing - multiple wounds []  - 0 Wound Imaging (photographs -  any number of wounds) []  - 0 Wound Tracing (instead of photographs) X- 1 5 Simple Wound Measurement - one wound []  - 0 Complex Wound Measurement - multiple wounds INTERVENTIONS - Wound Dressings []  -  Small Wound Dressing one or multiple wounds 0 X- 1 15 Medium Wound Dressing one or multiple wounds []  - 0 Large Wound Dressing one or multiple wounds X- 1 5 Application of Medications - topical []  - 0 Application of Medications - injection INTERVENTIONS - Miscellaneous []  - External ear exam 0 []  - 0 Specimen Collection (cultures, biopsies, blood, body fluids, etc.) []  - 0 Specimen(s) / Culture(s) sent or taken to Lab for analysis []  - 0 Patient Transfer (multiple staff / Civil Service fast streamer / Similar devices) []  - 0 Simple Staple / Suture removal (25 or less) []  - 0 Complex Staple / Suture removal (26 or more) []  - 0 Hypo / Hyperglycemic Management (close monitor of Blood Glucose) []  - 0 Ankle / Brachial Index (ABI) - do not check if billed separately X- 1 5 Vital Signs Has the patient been seen at the hospital within the last three years: Yes Total Score: 90 Level Of Care: New/Established - Level 3 Electronic Signature(s) Signed: 08/01/2020 4:27:39 PM By: [name] Coria RN Entered By: [name] on 07/31/2020 Los [name], [name] ([id number]) -------------------------------------------------------------------------------- Encounter Discharge Information Details Patient Name: [name] Date of Service: 07/31/2020 8:00 AM Medical Record Number: [id number] Patient Account Number: [id number] Date of Birth/Sex: 1981/03/07 (40 y.o. M) Treating RN: [name] Primary Care [name]: [name] Other Clinician: Jeanine [name] Referring [name]: [name] Treating [name]/Extender: [name] in Treatment: 190 Encounter Discharge Information Items Discharge Condition: Stable Ambulatory Status: Ambulatory Discharge Destination: Home Transportation:  Private Auto Accompanied By: self Schedule Follow-up Appointment: Yes Clinical Summary of Care: Electronic Signature(s) Signed: 07/31/2020 4:55:56 PM By: [name], [name] Breeding RN Entered By: [name], [name] on 07/31/2020 08:51:47 [name] ([id number]) -------------------------------------------------------------------------------- Lower Extremity Assessment Details Patient Name: [name] Date of Service: 07/31/2020 8:00 AM Medical Record Number: [id number] Patient Account Number: [id number] Date of Birth/Sex: [birth year] (39 y.o. M) Treating RN: [name] Primary Care Zoe Goonan: [name] Other Clinician: Jeanine [name] Referring Didier Brandenburg: [name] Treating Beryle Bagsby/Extender: [name] in Treatment: 190 Edema Assessment Assessed: [Left: No] [Right: No] [Left: Edema] [Right: :] Calf Left: Right: Point of Measurement: 33 cm From Medial Instep 49.5 cm Ankle Left: Right: Point of Measurement: 12 cm From Medial Instep 31 cm Vascular Assessment Pulses: Dorsalis Pedis Palpable: [Left:Yes] Electronic Signature(s) Signed: 07/31/2020 11:47:12 AM By: [name] Signed: 08/01/2020 4:27:39 PM By: [name] Coria RN Entered By: [name] on 07/31/2020 08:21:58 [name] ([id number]) -------------------------------------------------------------------------------- Multi Wound Chart Details Patient Name: [name] Date of Service: 07/31/2020 8:00 AM Medical Record Number: [id number] Patient Account Number: [id number] Date of Birth/Sex: 05-21-1980 (39 y.o. M) Treating RN: [name] Primary Care Aayla Marrocco: [name] Other Clinician: Jeanine [name] Referring Saphyra Hutt: [name] Treating Kaelin Holford/Extender: [name] in Treatment: 190 Vital Signs Height(in): 71 Pulse(bpm): 92 Weight(lbs): 338 Blood Pressure(mmHg): 152/85 Body Mass Index(BMI): 47 Temperature(F): 98.5 Respiratory Rate(breaths/min): 18 Photos:  [1:No Photos] [N/A:N/A] Wound Location: [1:Left, Lateral Lower Leg] [N/A:N/A] Wounding Event: [1:Gradually Appeared] [N/A:N/A] 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] [N/A:N/A] Comorbid History: [1:Sleep Apnea, Hypertension, Colitis] [N/A:N/A] Date Acquired: [1:11/18/2015] [N/A:N/A] Weeks of Treatment: [1:190] [N/A:N/A] Wound Status: [1:Open] [N/A:N/A] Measurements L x W x D (cm) [1:6.5x6.52x1.1] [N/A:N/A] Area (cm) : [1:33.285] [N/A:N/A] Volume (cm) : [1:36.614] [N/A:N/A] % Reduction in Area: [1:-578.00%] [N/A:N/A] % Reduction in Volume: [1:-832.40%] [N/A:N/A] Classification: [1:Full Thickness With Exposed Support Structures] [N/A:N/A] Exudate Amount: [1:Small] [N/A:N/A] Exudate Type: [1:Purulent] [N/A:N/A] Exudate Color: [1:yellow, brown, green] [N/A:N/A] Wound Margin: [1:Epibole] [N/A:N/A] Granulation Amount: [1:Medium (34-66%)] [N/A:N/A] Granulation Quality: [1:Red,  Pink] [N/A:N/A] Necrotic Amount: [1:Medium (34-66%)] [N/A:N/A] Exposed Structures: [1:Fat Layer (Subcutaneous Tissue): Yes Muscle: Yes Fascia: No Tendon: No Joint: No Bone: No None] [N/A:N/A N/A] Treatment Notes Electronic Signature(s) Signed: 08/01/2020 4:27:39 PM By: [name] Coria RN Entered By: [name] on 07/31/2020 08:31:15 [name] ([id number]) -------------------------------------------------------------------------------- Multi-Disciplinary Care Plan Details Patient Name: [name] Date of Service: 07/31/2020 8:00 AM Medical Record Number: [id number] Patient Account Number: [id number] Date of Birth/Sex: 10/11/1980 (39 y.o. M) Treating RN: [name] Primary Care Naelle Diegel: [name] Other Clinician: Jeanine [name] Referring Donnie Gedeon: [name] Treating Ashonte Angelucci/Extender: [name] in Treatment: Blair reviewed with physician Active Inactive Wound/Skin Impairment Nursing Diagnoses: Impaired tissue integrity Knowledge deficit related to  ulceration/compromised skin integrity Goals: Patient/caregiver will verbalize understanding of skin care regimen Date Initiated: 12/11/2016 Target Resolution Date: 08/24/2017 Goal Status: Active Ulcer/skin breakdown will have a volume reduction of 30% by week 4 Date Initiated: 12/11/2016 Date Inactivated: 06/26/2020 Target Resolution Date: 03/13/2017 Goal Status: Unmet Unmet Reason: comorbities Ulcer/skin breakdown will have a volume reduction of 50% by week 8 Date Initiated: 12/11/2016 Date Inactivated: 06/26/2020 Target Resolution Date: 03/13/2017 Goal Status: Unmet Unmet Reason: comorbities Ulcer/skin breakdown will have a volume reduction of 80% by week 12 Date Initiated: 12/11/2016 Date Inactivated: 06/26/2020 Target Resolution Date: 03/13/2017 Goal Status: Unmet Unmet Reason: comorbities Ulcer/skin breakdown will heal within 14 weeks Date Initiated: 12/11/2016 Date Inactivated: 06/26/2020 Target Resolution Date: 03/13/2017 Goal Status: Unmet Unmet Reason: comorbities Interventions: Assess patient/caregiver ability to obtain necessary supplies Assess patient/caregiver ability to perform ulcer/skin care regimen upon admission and as needed Assess ulceration(s) every visit Provide education on ulcer and skin care Treatment Activities: Skin care regimen initiated : 12/11/2016 Topical wound management initiated : 12/11/2016 Notes: Electronic Signature(s) Signed: 08/01/2020 4:27:39 PM By: [name] Coria RN Entered By: [name] on 07/31/2020 08:31:05 [name] ([id number]) -------------------------------------------------------------------------------- Pain Assessment Details Patient Name: [name] Date of Service: 07/31/2020 8:00 AM Medical Record Number: [id number] Patient Account Number: [id number] Date of Birth/Sex: 09/17/1980 (40 y.o. M) Treating RN: [name] Primary Care Lyrik Buresh: [name] Other Clinician: Jeanine [name] Referring Terence Googe: [name] Treating Bryelle Spiewak/Extender: [name] in Treatment: 190 Active Problems Location of Pain Severity and Description of Pain Patient Has Paino Yes Site Locations Rate the pain. Current Pain Level: 4 Pain Management and Medication Current Pain Management: Electronic Signature(s) Signed: 07/31/2020 11:47:12 AM By: [name] Signed: 08/01/2020 4:27:39 PM By: [name] Coria RN Entered By: [name] on 07/31/2020 08:14:38 [name] ([id number]) -------------------------------------------------------------------------------- Patient/Caregiver Education Details Patient Name: [name] Date of Service: 07/31/2020 8:00 AM Medical Record Number: [id number] Patient Account Number: [id number] Date of Birth/Gender: 10-Jun-1980 (39 y.o. M) Treating RN: [name] Primary Care Physician: [name] Other Clinician: Jeanine [name] Referring Physician: Alma [name] Treating Physician/Extender: [name] in Treatment: [phone] Education Assessment Education Provided To: Patient Education Topics Provided Wound/Skin Impairment: Methods: Explain/Verbal Responses: State content correctly Electronic Signature(s) Signed: 08/01/2020 4:27:39 PM By: [name] Coria RN Entered By: [name] on 07/31/2020 08:36:22 [name] ([id number]) -------------------------------------------------------------------------------- Wound Assessment Details Patient Name: [name] Date of Service: 07/31/2020 8:00 AM Medical Record Number: [id number] Patient Account Number: [id number] Date of Birth/Sex: [birth year] (39 y.o. M) Treating RN: [name] Primary Care Sueellen Kayes: [name] Other Clinician: Jeanine [name] Referring Izael Bessinger: [name] Treating Jamison Yuhasz/Extender: [name] in Treatment: 190 Wound Status Wound Number: 1 Primary Etiology: Pyoderma Wound Location: Left, Lateral Lower Leg Wound Status: Open Wounding Event: Gradually  Appeared  Comorbid History: Sleep Apnea, Hypertension, Colitis Date Acquired: 11/18/2015 Weeks Of Treatment: 190 Clustered Wound: No Photos Wound Measurements Length: (cm) 6.5 Width: (cm) 6.52 Depth: (cm) 1.1 Area: (cm) 33.285 Volume: (cm) 36.614 % Reduction in Area: -578% % Reduction in Volume: -832.4% Epithelialization: None Tunneling: No Undermining: No Wound Description Classification: Full Thickness With Exposed Support Structures Wound Margin: Epibole Exudate Amount: Small Exudate Type: Purulent Exudate Color: yellow, brown, green Foul Odor After Cleansing: No Slough/Fibrino Yes Wound Bed Granulation Amount: Medium (34-66%) Exposed Structure Granulation Quality: Red, Pink Fascia Exposed: No Necrotic Amount: Medium (34-66%) Fat Layer (Subcutaneous Tissue) Exposed: Yes Necrotic Quality: Adherent Slough Tendon Exposed: No Muscle Exposed: Yes Necrosis of Muscle: No Joint Exposed: No Bone Exposed: No Treatment Notes Wound #1 (Lower Leg) Wound Laterality: Left, Lateral Cleanser Soap and Water Discharge Instruction: Gently cleanse wound with antibacterial soap, rinse and pat dry prior to dressing wounds Nudd, Kota ([id number]) Peri-Wound Care Topical Triamcinolone Acetonide Cream, 0.1%, 15 (g) tube Discharge Instruction: to wound bed , lightly Primary Dressing Santyl Collagenase Ointment, 30 (gm), tube Discharge Instruction: apply to 8oclock area Secondary Dressing Mepilex Border Flex, 6x6 (in/in) Discharge Instruction: Apply to wound as directed. Do not cut. Secured With Compression Wrap Compression Stockings Environmental education officer) Signed: 07/31/2020 8:37:35 AM By: [name] Signed: 08/01/2020 4:27:39 PM By: [name] Coria RN Entered By: [name] on 07/31/2020 08:37:35 Dunson, [name] ([id number]) -------------------------------------------------------------------------------- Vitals Details Patient Name: [name] Date of Service:  07/31/2020 8:00 AM Medical Record Number: [id number] Patient Account Number: [id number] Date of Birth/Sex: 1981/04/23 (39 y.o. M) Treating RN: [name] Primary Care Emylie Amster: [name] Other Clinician: Jeanine [name] Referring Leonie Amacher: [name] Treating Haizley Cannella/Extender: [name] in Treatment: 190 Vital Signs Time Taken: 08:10 Temperature (F): 98.5 Height (in): 71 Pulse (bpm): 76 Weight (lbs): 338 Respiratory Rate (breaths/min): 18 Body Mass Index (BMI): 47.1 Blood Pressure (mmHg): 152/85 Reference Range: 80 - 120 mg / dl Electronic Signature(s) Signed: 07/31/2020 11:47:12 AM By: [name] Entered By: [name] on 07/31/2020 08:13:53

## 2020-08-07 ENCOUNTER — Encounter: Payer: 59 | Admitting: Physician Assistant

## 2020-08-07 ENCOUNTER — Other Ambulatory Visit: Payer: Self-pay

## 2020-08-07 DIAGNOSIS — L88 Pyoderma gangrenosum: Secondary | ICD-10-CM | POA: Diagnosis not present

## 2020-08-07 NOTE — Progress Notes (Addendum)
[name] ([id number]) Visit Report for 08/07/2020 Chief Complaint Document Details Patient Name: [name] Date of Service: 08/07/2020 8:00 AM Medical Record Number: [id number] Patient Account Number: [id number] Date of Birth/Sex: [birth year] (40 y.o. 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 M) Treating RN: Carlene Coria Primary Care Provider: [name] Other Clinician: Jeanine Luz Referring Provider: [name] Treating Provider/Extender: [name] Cliche in Treatment: 191 History of Present Illness HPI Description: 12/04/16; 40 year old man who comes into the clinic today for review of a wound on the posterior left calf. He tells me that is been there for about a year. He is not a diabetic he does smoke half a pack per day. He was seen in the ER on 11/20/16 felt to have cellulitis around the wound and was given clindamycin. An x-ray did not show osteomyelitis. The patient initially tells me that he has a milk allergy that sets off a pruritic itching rash on his lower legs which she scratches incessantly and he thinks that's what may have set up the wound. He has been using various topical antibiotics and ointments without any effect. He works in a [employer] and is on his feet all day.  He does not have a prior history of wounds however he does have the rash on both lower legs the right arm and the ventral aspect of his left arm. These are excoriations and clearly have had scratching however there are of macular looking areas on both legs including a substantial larger area on the right leg. This does not have an underlying open area. There is no blistering. The patient tells me that 2 years ago in [place] in response to the rash on his legs he saw a dermatologist who told him he had a condition which may be pyoderma gangrenosum although I may be putting words into his mouth. He seemed to recognize this. 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 He is not in any treatment for this. He's had no recent travel 12/11/16; the patient arrives today with his wound and roughly the same condition we've been using silver alginate this is a deep punched out wound with some surrounding erythema but no tenderness. Biopsy I did did not show confirmed pyoderma gangrenosum suggested nonspecific inflammation and vasculitis but does not provide an actual description of what was seen by the pathologist. I'm really not able to understand this We have also received information from the patient's dermatologist in [place] notes from April 2016. This was a doctor Agarwal-antal. 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 He was prescribed topical steroid high potency under occlusion which helped but at this point the patient did not have a deep punched out wound. 12/18/16; the patient's wound is larger in terms of surface area however this surface looks better and there is less depth. The surrounding erythema also is better. The patient states that the wrap we put on came off 2 days ago when he has been  using his compression stockings. He we are in the process of getting a dermatology consult. 12/26/16 on evaluation today patient's left lower extremity wound  shows evidence of infection with surrounding erythema noted. He has been tolerating the dressing changes but states that he has noted more discomfort. There is a larger area of erythema surrounding the wound. No fevers, chills, nausea, or vomiting noted at this time. With that being said the wound still does have slough covering the surface. He is not allergic to any medication that he is aware of at this point. In regard to his right lower extremity he had several regions that are erythematous and pruritic he wonders if there's anything we can do to help that. 01/02/17 I reviewed patient's wound culture which was obtained his visit last week. He was placed on doxycycline at that point. Unfortunately that does not appear to be an antibiotic that would likely help with the situation however the pseudomonas noted on culture is sensitive to Cipro. Also unfortunately patient's wound seems to have a large compared to last week's evaluation. Not severely so but there are definitely increased measurements in general. He is continuing to have discomfort as well he writes this to be a seven out of 10. In fact he would prefer me not to perform any debridement today due to the fact that he is having discomfort and considering he has an active infection on the little reluctant to do so anyway. No fevers, chills, nausea, or vomiting noted at this time. 01/08/17; patient seems dermatology on September 5. I suspect dermatology will want the slides from the biopsy I did sent to their pathologist. I'm not sure if there is a way we can expedite that. In any case the culture I did before I left on vacation 3 weeks ago showed Pseudomonas he was given 10 days of Cipro and per her description of her intake nurses is actually somewhat better this week although the wound is quite a bit bigger than I remember the last time I saw this. He still has 3 more days of Cipro 01/21/17; dermatology appointment tomorrow. He has completed  the ciprofloxacin for Pseudomonas. Surface of the wound looks better however he is had some deterioration in the lesions on his right leg. Meantime the left lateral leg wound we will continue with sample 01/29/17; patient had his dermatology appointment but I can't yet see that note. He is completed his antibiotics. The wound is more superficial but considerably larger in circumferential area than when he came in. This is in his left lateral calf. He also has swollen erythematous areas with superficial wounds on the right leg and small papular areas on both arms. There apparently areas in her his upper thighs and buttocks I did not look at those. Dermatology biopsied the right leg. Hopefully will have their input next week. 02/05/17; patient went back to see his dermatologist who told him that he had a "scratching problem" as well as staph. He is now on a 30 day course of doxycycline and I believe she gave him triamcinolone cream to the right leg areas to help with the itching [not exactly sure but probably triamcinolone]. She apparently looked at the left lateral leg wound although this was not rebiopsied and I think felt to be ultimately part of the same pathogenesis. He is using sample border foam and changing nevus himself. He now has a new open area on the right posterior leg which was his biopsy site I don't have any  of the dermatology notes 02/12/17; we put the patient in compression last week with SANTYL to the wound on the left leg and the biopsy. Edema is much better and the depth of the wound is now at level of skin. Area is still the same oBiopsy site on the right lateral leg we've also been using santyl with a border foam dressing and he is changing this himself. 02/19/17; Using silver alginate started last week to both the substantial left leg wound and the biopsy site on the right wound. He is tolerating compression well. Has a an appointment with his primary M.D. tomorrow wondering about  diuretics although I'm wondering if the edema problem is actually lymphedema 02/26/17; the patient has been to see his primary doctor Dr. Jerrel Ivory at [place] our primary care. She started him on Lasix 20 mg and this seems to have helped with the edema. However we are not making substantial change with the left lateral calf wound and inflammation. The biopsy site on the right leg also looks stable but not really all that different. 03/12/17; the patient has been to see vein and vascular Dr. Lucky Cowboy. He has had venous reflux studies I have not reviewed these. I did get a call from his dermatology office. They felt that he might have pathergy based on their biopsy on his right leg which led them to look at the slides of [name], [name] ([id number]) the biopsy I did on the left leg and they wonder whether this represents pyoderma gangrenosum which was the original supposition in a man with ulcerative colitis albeit inactive for many years. They therefore recommended clobetasol and tetracycline i.e. 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 03/26/17; apparently the patient just had reflux studies not an appointment with Dr. dew. She arrives in clinic today having applied clobetasol for 2-3 weeks. He notes over the last 2-3 days excessive drainage having to change the dressing 3-4 times a day and also expanding erythema. 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 Lillia Carmel at [facility] dermatology locally who kindly saw him at my request. 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 My concern was the degree of erythema medially extending from his left leg wound which was either  inflammation from pyoderma or cellulitis. I put him on Augmentin however culture of the wound showed Pseudomonas which is quinolone sensitive. I really don't believe he has cellulitis however in view of everything I will continue and give him a course of Cipro. He is also on doxycycline as an immune modulator for the pyoderma. In addition to his original wound on the left lateral leg with surrounding erythema he has a wound on the right posterior calf which was an original biopsy site done by dermatology. 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 Saw Dr. Lillia Carmel yesterday. He has been using topical antibiotics to both wound areas his original wound on the left and the biopsies/pathergy area on the right. There is definitely some improvement in the inflammation around the wound on the right although the patient states he has increasing sensitivity of the wounds. He is on prednisone 60 and doxycycline 1 as prescribed by Dr. Lillia Carmel. He is covering the  topical antibiotic with gauze and putting this in his own compression stocks and changing this daily. He states that Dr. Lottie Rater did a culture of the left leg wound yesterday 05/07/17; pyoderma gangrenosum. The patient saw Dr. Lillia Carmel yesterday and has a follow-up with her in one month. He is still using topical antibiotics to both wounds although he can't recall exactly what type. He is still on prednisone 60 mg. Dr. Lillia Carmel stated that the doxycycline could stop if we were in agreement. 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 The right medial leg was induced by biopsy/pathergy. The area on the right is essentially healed. 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 The right medial leg has  closed and remains closed. He is still on prednisone 60. oHe tells me he missed his last dermatology appointment with Dr. Lillia Carmel but will make another appointment. He reports that her blood sugar at a recent screen in [place] was high 200's. He was 180 today. 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 I think he is going to require still much longer prednisone perhaps another 3 months before attempting to taper. In the meantime his wound is a lot better. Smaller. He is cleaning this off daily and applying topical antibiotics. When he was last in the clinic I thought about changing to [facility] and actually put in a couple of calls to dermatology although probably not during their business hours. 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 He continues on oral prednisone. He has been using triple antibiotic ointment. There is surface debris and we will transition to [facility] and have him return in 2 weeks. He has lost 30 pounds since his last appointment with lifestyle modification. He may benefit from topical steroid cream for treatment this can be considered at a later date. 08/22/17 on evaluation today patient appears to actually be doing rather well in regard to his left lateral lower extremity ulcer. He has actually been managed by Dr. Dellia Nims most recently. Patient is currently on oral steroids at this time. This seems to have been of benefit for him. Nonetheless his last visit was actually with Leah on 08/06/17. Currently he is not utilizing any topical steroid creams although this could be of benefit as well. No fevers, chills, nausea, or vomiting noted at this time. 09/05/17 on evaluation today patient appears to be doing better in regard to his left lateral lower extremity ulcer. He has been tolerating the dressing changes without complication. He is using Santyl  with good effect. Overall I'm very pleased with how things are standing at this point. Patient likewise is happy that this is doing better. 09/19/17 on evaluation today patient actually appears to be doing rather well in regard to his left lateral lower extremity ulcer. 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 He's not having any significant pain. 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 He has been tolerating the Santyl without complication and in general I feel like he's making good progress. 10/17/17  on evaluation today patient appears to be doing very well in regard to his left lateral lower surety ulcer. He has been tolerating the dressing changes without complication. There does not appear to be any evidence of infection he's alternating the Santyl and the triple antibiotic ointment every other day this seems to be doing well for him. 11/03/17 on evaluation today patient appears to be doing very well in regard to his left lateral lower extremity ulcer. He is been tolerating the dressing changes without complication which is good news. Fortunately there does not appear to be any evidence of infection which is also great news. Overall is doing excellent they are starting to taper down on the prednisone is down to 40 mg at this point it also started topical clobetasol for him. 11/17/17 on evaluation today patient appears to be doing well in regard to his left lateral lower surety ulcer. He's been tolerating the dressing changes without complication. He does note that he is having no pain, no excessive drainage or discharge, and overall he feels like things are going about how he would expect and hope they would. Overall he seems to have no evidence of infection at this time in my opinion which is  good news. 12/04/17-He is seen in follow-up evaluation for right lateral lower extremity ulcer. He has been applying topical steroid cream. Today's measurement show slight increase in size. Over the next 2 weeks we will transition to every other day Santyl and steroid cream. He has been encouraged to monitor for changes and notify clinic with any concerns 12/15/17 on evaluation today patient's left lateral motion the ulcer and fortunately is doing worse again at this point. This just since last week to this week has close to doubled in size according to the patient. I did not seeing last week's I do not have a visual to compare this to in our system was also down so we do not have all the charts and at this point. Nonetheless it does have me somewhat concerned in regard to the fact that again he was worried enough about it he has contact the dermatology that placed them back on the full strength, 50 mg a day of the prednisone that he was taken previous. He continues to alternate using clobetasol along with Santyl at this point. He is obviously somewhat frustrated. 12/22/17 on evaluation today patient appears to be doing a little worse compared to last evaluation. Unfortunately the wound is a little deeper and slightly larger than the last week's evaluation. With that being said he has made some progress in regard to the irritation surrounding at this time unfortunately despite that progress that's been made he still has a significant issue going on here. 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 [name], [name] ([id number]) For that reason I discussed with him today the possibility of perform a wound culture to ensure there's nothing overtly infected. 01/06/18 on evaluation today patient's wound is larger and deeper than previously evaluated. With that being  said it did appear that his wound was infected after my last evaluation with him. Subsequently I did end up prescribing a prescription for Bactrim DS which she has been taking and having no complication with. Fortunately there does not appear to be any evidence of infection at this point in time as far as anything spreading, no want to touch, and  overall I feel like things are showing signs of improvement. 01/13/18 on evaluation today patient appears to be even a little larger and deeper than last time. There still muscle exposed in the base of the wound. Nonetheless he does appear to be less erythematous I do believe inflammation is calming down also believe the infection looks like it's probably resolved at this time based on what I'm seeing. No fevers, chills, nausea, or vomiting noted at this time. 01/30/18 on evaluation today patient actually appears to visually look better for the most part. Unfortunately those visually this looks better he does seem to potentially have what may be an abscess in the muscle that has been noted in the central portion of the wound. This is the first time that I have noted what appears to be fluctuance in the central portion of the muscle. With that being said I'm somewhat more concerned about the fact that this might indicate an abscess formation at this location. I do believe that an ultrasound would be appropriate. This is likely something we need to try to do as soon as possible. He has been switch to mupirocin ointment and he is no longer using the steroid ointment as prescribed by dermatology he sees them again next week he's been decreased from 60 to 40 mg of prednisone. 03/09/18 on evaluation today patient actually appears to be doing a little better compared to last time I saw him. There's not as much erythema surrounding the wound itself. He I did review his most recent infectious disease note which was dated 02/24/18. He saw Dr. Michel Bickers in [place].  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 Patient is now off of antibiotics and they are continually observing this. There seems to be no change in the past two weeks in my pinion based on what the patient says and what I see today compared to what Dr. Megan Salon likely saw two weeks ago. No fevers, chills, nausea, or vomiting noted at this time. 03/23/18 on evaluation today patient's wound actually appears to be showing signs of improvement which is good news. He is currently still on the Dapsone. He is also working on tapering the prednisone to get off of this and Dr. Lottie Rater is working with him in this regard. Nonetheless overall I feel like the wound is doing well it does appear based on the infectious disease note that I reviewed from Dr. 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 I did also review the results of his ultrasound of left lower extremity which revealed there was a dentist tissue in the base of the wound without an abscess noted. 04/06/18 on evaluation today the patient's left lateral lower extremity ulcer actually appears to be doing fairly well which is excellent news. There does not appear to be any evidence of infection at this time which is also great news. Overall he still does have a significantly large ulceration although little by little he seems to be making progress. He is down to 10 mg a day of the prednisone. 04/20/18 on evaluation today patient actually appears to be doing excellent at this time in regard to his left lower extremity ulcer. He's making signs of good progress unfortunately this is taking much longer than we would really like to see  but nonetheless he is making progress. Fortunately there does not appear to be any evidence of infection at this time. No  fevers, chills, nausea, or vomiting noted at this time. The patient has not been using the Santyl due to the cost he hadn't got in this field yet. He's mainly been using the antibiotic ointment topically. Subsequently he also tells me that he really has not been scrubbing in the shower I think this would be helpful again as I told him it doesn't have to be anything too aggressive to even make it believe just enough to keep it free of some of the loose slough and biofilm on the wound surface. 05/11/18 on evaluation today patient's wound appears to be making slow but sure progress in regard to the left lateral lower extremity ulcer. He is been tolerating the dressing changes without complication. Fortunately there does not appear to be any evidence of infection at this time. He is still just using triple antibiotic ointment along with clobetasol occasionally over the area. He never got the Santyl and really does not seem to intend to in my pinion. 06/01/18 on evaluation today patient appears to be doing a little better in regard to his left lateral lower extremity ulcer. He states that overall he does not feel like he is doing as well with the Dapsone as he did with the prednisone. Nonetheless he sees his dermatologist later today and is gonna talk to them about the possibility of going back on the prednisone. Overall again I believe that the wound would be better if you would utilize Santyl but he really does not seem to be interested in going back to the [place] at this point. He has been using triple antibiotic ointment. 06/15/18 on evaluation today patient's wound actually appears to be doing about the same at this point. Fortunately there is no signs of infection at this time. He has made slight improvements although he continues to not really want to clean the wound bed at this point. He states that he just doesn't mess with it he doesn't want to cause any problems with everything else he has going  on. He has been on medication, antibiotics as prescribed by his dermatologist, for a staff infection of his lower extremities which is really drying out now and looking much better he tells me. Fortunately there is no sign of overall infection. 06/29/18 on evaluation today patient appears to be doing well in regard to his left lateral lower surety ulcer all things considering. Fortunately his staff infection seems to be greatly improved compared to previous. He has no signs of infection and this is drying up quite nicely. He is still the doxycycline for this is no longer on cental, Dapsone, or any of the other medications. His dermatologist has recommended possibility of an infusion but right now he does not want to proceed with that. 07/13/18 on evaluation today patient appears to be doing about the same in regard to his left lateral lower surety ulcer. Fortunately there's no signs of infection at this time which is great news. Unfortunately he still builds up a significant amount of Slough/biofilm of the surface of the wound he still is not really cleaning this as he should be appropriately. Again I'm able to easily with saline and gauze remove the majority of this on the surface which if you would do this at home would likely be a dramatic improvement for him as far as getting the area to improve. Nonetheless overall I  still feel like he is making progress is just very slow. I think Santyl will be of benefit for him as well. Still he has not gotten this as of this point. 07/27/18 on evaluation today patient actually appears to be doing little worse in regards of the erythema around the periwound region of the wound he also tells me that he's been having more drainage currently compared to what he was experiencing last time I saw him. He states not quite as bad as what he had because this was infected previously but nonetheless is still appears to be doing poorly. Fortunately there is no evidence  of systemic infection at this point. The patient tells me that he is not going to be able to afford the Santyl. He is still waiting to hear about the infusion therapy with his dermatologist. Apparently she wants an updated colonoscopy first. 08/10/18 on evaluation today patient appears to be doing better in regard to his left lateral lower extremity ulcer. Fortunately he is showing signs of improvement in this regard he's actually been approved for Remicade infusion's as well although this has not been scheduled as of yet. Fortunately there's no signs of active infection at this time in regard to the wound although he is having some issues with infection of the right lower extremity is been seen as dermatologist for this. Fortunately they are definitely still working with him trying to keep things under control. [name], [name] ([id number]) 09/07/18 on evaluation today patient is actually doing rather well in regard to his left lateral lower extremity ulcer. He notes these actually having some hair grow back on his extremity which is something he has not seen in years. He also tells me that the pain is really not giving them any trouble at this time which is also good news overall she is very pleased with the progress he's using a combination of the mupirocin along with the probate is all mixed. 09/21/18 on evaluation today patient actually appears to be doing fairly well all things considered in regard to his looks from the ulcer. He's been tolerating the dressing changes without complication. Fortunately there's no signs of active infection at this time which is good news he is still on all antibiotics or prevention of the staff infection. He has been on prednisone for time although he states it is gonna contact his dermatologist and see if she put them on a short course due to some irritation that he has going on currently. Fortunately there's no evidence of any overall worsening this is going very slow  I think cental would be something that would be helpful for him although he states that $50 for tube is quite expensive. He therefore is not willing to get that at this point. 10/06/18 on evaluation today patient actually appears to be doing decently well in regard to his left lateral leg ulcer. He's been tolerating the dressing changes without complication. Fortunately there's no signs of active infection at this time. Overall I'm actually rather pleased with the progress he's making although it's slow he doesn't show any signs of infection and he does seem to be making some improvement. I do believe that he may need a switch up and dressings to try to help this to heal more appropriately and quickly. 10/19/18 on evaluation today patient actually appears to be doing better in regard to his left lateral lower extremity ulcer. This is shown signs of having much less Slough buildup at this point due to the fact  he has been using the Santyl. Obviously this is very good news. The overall size of the wound is not dramatically smaller but again the appearance is. 11/02/18 on evaluation today patient actually appears to be doing quite well in regard to his lower [place] ulcer. A lot of the skin around the ulcer is actually somewhat irritating at this point this seems to be more due to the dressing causing irritation from the adhesive that anything else. Fortunately there is no signs of active infection at this time. 11/24/18 on evaluation today patient appears to be doing a little worse in regard to his overall appearance of his lower extremity ulcer. There's more erythema and warmth around the wound unfortunately. He is currently on doxycycline which he has been on for some time. With that being said I'm not sure that seems to be helping with what appears to possibly be an acute cellulitis with regard to his left lower extremity ulcer. No fevers, chills, nausea, or vomiting noted at this time. 12/08/18 on  evaluation today patient's wounds actually appears to be doing significantly better compared to his last evaluation. He has been using Santyl along with alternating tripling about appointment as well as the steroid cream seems to be doing quite well and the wound is showing signs of improvement which is excellent news. Fortunately there's no evidence of infection and in fact his culture came back negative with only normal skin flora noted. 12/21/2018 upon evaluation today patient actually appears to be doing excellent with regard to his ulcer. This is actually the best that I have seen it since have been helping to take care of him. It is both smaller as well as less slough noted on the surface of the wound and seems to be showing signs of good improvement with new skin growing from the edges. He has been using just the triamcinolone he does wonder if he can get a refill of that ointment today. 01/04/2019 upon evaluation today patient actually appears to be doing well with regard to his left lateral lower extremity ulcer. With that being said it does not appear to be that he is doing quite as well as last time as far as progression is concerned. There does not appear to be any signs of infection or significant irritation which is good news. With that being said I do believe that he may benefit from switching to a collagen based dressing based on how clean The wound appears. 01/18/2019 on evaluation today patient actually appears to be doing well with regard to his wound on the left lower extremity. He is not made a lot of progress compared to where we were previous but nonetheless does seem to be doing okay at this time which is good news. There is no signs of active infection which is also good news. My only concern currently is I do wish we can get him into utilizing the collagen dressing his insurance would not pay for the supplies that we ordered although it appears that he may be able to order this  through his supply company that he typically utilizes. This is [employer]. Nonetheless he did try to order it during the office visit today and it appears this did go through. We will see if he can get that it is a different brand but nonetheless he has collagen and I do think will be beneficial. 02/01/2019 on evaluation today patient actually appears to be doing a little worse today in regard to the overall size of  his wounds. Fortunately there is no signs of active infection at this time. That is visually. Nonetheless when this is happened before it was due to infection. For that reason were somewhat concerned about that this time as well. 02/08/2019 on evaluation today patient unfortunately appears to be doing slightly worse with regard to his wound upon evaluation today. Is measuring a little deeper and a little larger unfortunately. I am not really sure exactly what is causing this to enlarge he actually did see his dermatologist she is going to see about initiating Humira for him. Subsequently she also did do steroid injections into the wound itself in the periphery. Nonetheless still nonetheless he seems to be getting a little bit larger he is gone back to just using the steroid cream topically which I think is appropriate. I would say hold off on the collagen for the time being is definitely a good thing to do. Based on the culture results which we finally did get the final result back regarding it shows staph as the bacteria noted again that can be a normal skin bacteria based on the fact however he is having increased drainage and worsening of the wound measurement wise I would go ahead and place him on an antibiotic today I do believe for this. 02/15/2019 on evaluation today patient actually appears to be doing somewhat better in regard to his ulcer. There is no signs of worsening at this time I did review his culture results which showed evidence of Staphylococcus aureus but not MRSA. Again  this could just be more related to the normal skin bacteria although he states the drainage has slowed down quite a bit he may have had a mild infection not just colonization. And was much smaller and then since around10/04/2019 on evaluation today patient appears to be doing unfortunately worse as far as the size of the wound. I really feel like that this is steadily getting larger again it had been doing excellent right at the beginning of September we have seen a steady increase in the area of the wound it is almost 2-1/2 times the size it was on September 1. Obviously this is a bad trend this is not wanting to see. For that reason we went back to using just the topical triamcinolone cream which does seem to help with inflammation. I checked him for bacteria by way of culture and nothing showed positive there. I am considering giving him a short course of a tapering steroid Dosepak today to see if that is can be beneficial for him. The patient is in agreement with giving that a try. 03/08/2019 on evaluation today patient appears to be doing very well in comparison to last evaluation with regard to his lower extremity ulcer. This is showing signs of less inflammation and actually measuring slightly smaller compared to last time every other week over the past month and a half he has been measuring larger larger larger. Nonetheless I do believe that the issue has been inflammation the prednisone does seem to [facility], [name] ([id number]) have been beneficial for him which is good news. No fevers, chills, nausea, vomiting, or diarrhea. 03/22/2019 on evaluation today patient appears to be doing about the same with regard to his leg ulcer. He has been tolerating the dressing changes without complication. With that being said the wound seems to be mostly arrested at its current size but really is not making any progress except for when we prescribed the prednisone. He did show some signs of  dropping as far as  the overall size of the wound during that interval week. Nonetheless this is something he is not on long-term at this point and unfortunately I think he is getting need either this or else the Humira which his dermatologist has discussed try to get approval for. With that being said he will be seeing his dermatologist on the 11th of this month that is November. 04/19/2019 on evaluation today patient appears to be doing really about the same the wound is measuring slightly larger compared to last time I saw him. He has not been into the office since November 2 due to the fact that he unfortunately had Covid as that his entire family. He tells me that it was rough but they did pull-through and he seems to be doing much better. Fortunately there is no signs of active infection at this time. No fevers, chills, nausea, vomiting, or diarrhea. 05/10/2019 on evaluation today patient unfortunately appears to be doing significantly worse as compared to last time I saw him. He does tell me that he has had his first dose of Humira and actually is scheduled to get the next one in the upcoming week. With that being said he tells me also that in the past several days he has been having a lot of issues with green drainage she showed me a picture this is more blue-green in color. He is also been having issues with increased sloughy buildup and the wound does appear to be larger today. Obviously this is not the direction that we want everything to take based on the starting of his Humira. Nonetheless I think this is definitely a result of likely infection and to be honest I think this is probably Pseudomonas causing the infection based on what I am seeing. 05/24/2019 on evaluation today patient unfortunately appears to be doing significantly worse compared to his prior evaluation with me 2 weeks ago. I did review his culture results which showed that he does have Staph aureus as well as Pseudomonas noted on the culture.  Nonetheless the Levaquin that I prescribed for him does not appear to have been appropriate and in fact he tells me he is no longer experiencing the green drainage and discharge that he had at the last visit. Fortunately there is no signs of active infection at this time which is good news although the wound has significantly worsened it in fact is much deeper than it was previous. We have been utilizing up to this point triamcinolone ointment as the prescription topical of choice but at this time I really feel like that the wound is getting need to be packed in order to appropriately manage this due to the deeper nature of the wound. Therefore something along the lines of an alginate dressing may be more appropriate. 05/31/2019 upon inspection today patient's wound actually showed signs of doing poorly at this point. Unfortunately he just does not seem to be making any good progress despite what we have tried. He actually did go ahead and pick up the Cipro and start taking that as he was noticing more green drainage he had previously completed the Levaquin that I prescribed for him as well. Nonetheless he missed his appointment for the seventh last week on Wednesday with the wound care center and [facility] where his dermatologist referred him. Obviously I do think a second opinion would be helpful at this point especially in light of the fact that the patient seems to be doing so  poorly despite the fact that we have tried everything that I really know how at this point. The only thing that ever seems to have helped him in the past is when he was on high doses of continual steroids that did seem to make a difference for him. 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 06/07/2019 upon evaluation today patient unfortunately appears to be doing  worse yet again with regard to his wound. In fact I am starting to question whether or not he may have a fluid pocket in the muscle at this point based on the bulging and the soft appearance to the central portion of the muscle area. There is not anything draining from the muscle itself at this time which is good news but nonetheless the wound is expanding. I am not really seeing any results of the Humira as far as overall wound progression based on what I am seeing at this point. The patient has been referred for second opinion with regard to his wound to the [facility] wound care center by his dermatologist which I definitely am not in opposition to. Unfortunately we tried multiple dressings in the past including collagen, alginate, and at one point even Hydrofera Blue. With that being said he is never really used it for any significant amount of time due to the fact that he often complains of pain associated with these dressings and then will go back to either using the Santyl which she has done intermittently or more frequently the triamcinolone. He is also using his own compression stockings. We have wrapped him in the past but again that was something else that he really was not a big fan of. 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 He seemed to do much better when he was on the steroids although honestly I understand that the steroids are not necessarily the best medication to be on long-term obviously 06/14/2019 on evaluation today patient appears to be doing actually a little bit better with regard to the overall appearance with his leg. Unfortunately he does continue to have  issues with what appears to be some fluid underneath the muscle although he did see the wound specialty center at [facility] last week their main goals were to see about infusion therapy in place of the Humira as they feel like that is not quite strong enough. They also recommended that we continue with the treatment otherwise as we are they felt like that was appropriate and they are okay with him continuing to follow-up here with [place] in that regard. With that being said they are also sending him to the vein specialist there to see about vein stripping and if that would be of benefit for him. Subsequently they also did not really address whether or not an ultrasound of the muscle area to see if there is anything that needs to be addressed here would be appropriate or not. For that reason I discussed this with him last  week I think we may proceed down that road at this point. 06/21/2019 upon evaluation today patient's wound actually appears to be doing slightly better compared to previous evaluations. I do believe that he has made a difference with regard to the progression here with the use of oral steroids. Again in the past has been the only thing that is really calm things down. He does tell me that from [facility] is gotten a good news from there that there are no further vein stripping that is necessary at this point. I do not have that available for review today although the patient did relay this to me. He also did obtain and have the ultrasound of the wound completed which I did sign off on today. It does appear that there is no fluid collection under the muscle this is likely then just edematous tissue in general. That is also good news. Overall I still believe the inflammation is the main issue here. He did inquire about the possibility of a wound VAC again with the muscle protruding like it is I am not really sure whether the wound VAC is necessarily ideal or not. That is something we will have to consider  although I do believe he may need compression wrapping to try to help with edema control which could potentially be of benefit. 06/28/2019 on evaluation today patient appears to be doing slightly better measurement wise although this is not terribly smaller he least seems to be trending towards that direction. With that being said he still seems to have purulent drainage noted in the wound bed at this time. He has been on Levaquin followed by Cipro over the past month. Unfortunately he still seems to have some issues with active infection at this time. I did perform a culture last week in order to evaluate and see if indeed there was still anything going on. Subsequently the culture did come back showing Pseudomonas which is consistent with the drainage has been having which is blue-green in color. He also has had an odor that again was somewhat consistent with Pseudomonas as well. Long story short it appears that the culture showed an intermediate finding with regard to how well the Cipro will work for the Pseudomonas infection. Subsequently being that he does not seem to be clearing up and at best what we are doing is just keeping this at [place] I think he may need to see infectious disease to discuss IV antibiotic options. [name], BOGDEN ([id number]) 07/05/2019 upon evaluation today patient appears to be doing okay in regard to his leg ulcer. He has been tolerating the dressing changes at this point without complication. Fortunately there is no signs of active infection at this time which is good news. No fevers, chills, nausea, vomiting, or diarrhea. With that being said he does have an appointment with infectious disease tomorrow and his primary care on Wednesday. Again the reason for the infectious disease referral was due to the fact that he did not seem to be fully resolving with the use of oral antibiotics and therefore we were thinking that IV antibiotic therapy may be necessary secondary to the  fact that there was an intermediate finding for how effective the Cipro may be. Nonetheless again he has been having a lot of purulent and even green drainage. Fortunately right now that seems to have calmed down over the past week with the reinitiation of the oral antibiotic. Nonetheless we will see what Dr. Megan Salon has to say. 07/12/2019 upon evaluation today  patient appears to be doing about the same at this point in regard to his left lower extremity ulcer. Fortunately there is no signs of active infection at this time which is good news I do believe the Levaquin has been beneficial I did review Dr. Hale Bogus note and to be honest I agree that the patient's leg does appear to be doing better currently. What we found in the past as he does not seem to really completely resolve he will stop the antibiotic and then subsequently things will revert back to having issues with blue-green drainage, increased pain, and overall worsening in general. Obviously that is the reason I sent him back to infectious disease. 07/19/2019 upon evaluation today patient appears to be doing roughly the same in size there is really no dramatic improvement. He has started back on the Levaquin at this point and though he seems to be doing okay he did still have a lot of blue/green drainage noted on evaluation today unfortunately. I think that this is still indicative more likely of a Pseudomonas infection as previously noted and again he does see Dr. Megan Salon in just a couple of days. I do not know that were really able to effectively clear this with just oral antibiotics alone based on what I am seeing currently. Nonetheless we are still continue to try to manage as best we can with regard to the patient and his wound. I do think the wrap was helpful in decreasing the edema which is excellent news. No fevers, chills, nausea, vomiting, or diarrhea. 07/26/2019 upon evaluation today patient appears to be doing slightly better with  regard to the overall appearance of the muscle there is no dark discoloration centrally. Fortunately there is no signs of active infection at this time. No fevers, chills, nausea, vomiting, or diarrhea. Patient's wound bed currently the patient did have an appointment with Dr. Megan Salon at infectious disease last week. With that being said Dr. Megan Salon the patient states was still somewhat hesitant about put him on any IV antibiotics he wanted [place] to repeat cultures today and then see where things go going forward. He does look like Dr. Megan Salon because of some improvement the patient did have with the Levaquin wanted [place] to see about repeating cultures. If it indeed grows the Pseudomonas again then he recommended a possibility of considering a PICC line placement and IV antibiotic therapy. He plans to see the patient back in 1 to 2 weeks. 08/02/2019 upon evaluation today patient appears to be doing poorly with regard to his left lower extremity. We did get the results of his culture back it shows that he is still showing evidence of Pseudomonas which is consistent with the purulent/blue-green drainage that he has currently. Subsequently the culture also shows that he now is showing resistance to the oral fluoroquinolones which is unfortunate as that was really the only thing to treat the infection prior. I do believe that he is looking like this is going require IV antibiotic therapy to get this under control. Fortunately there is no signs of systemic infection at this time which is good news. The patient does see Dr. Megan Salon tomorrow. 08/09/2019 upon evaluation today patient appears to be doing better with regard to his left lower extremity ulcer in regard to the overall appearance. He is currently on IV antibiotic therapy. As ordered by Dr. Megan Salon. Currently the patient is on ceftazidime which she is going to take for the next 2 weeks and then follow-up for 4 to 5-week  appointment with Dr. Megan Salon.  The patient started this this past Friday symptoms have not for a total of 3 days currently in full. 08/16/2019 upon evaluation today patient's wound actually does show muscle in the base of the wound but in general does appear to be much better as far as the overall evidence of infection is concerned. In fact I feel like this is for the most part cleared up he still on the IV antibiotics he has not completed the full course yet but I think he is doing much better which is excellent news. 08/23/2019 upon evaluation today patient appears to be doing about the same with regard to his wound at this point. He tells me that he still has pain unfortunately. Fortunately there is no evidence of systemic infection at this time which is great news. There is significant muscle protrusion. 09/13/19 upon evaluation today patient appears to be doing about the same in regard to his leg unfortunately. He still has a lot of drainage coming from the ulceration there is still muscle exposed. With that being said the patient's last wound culture still showed an intermediate finding with regard to the Pseudomonas he still having the bluish/green drainage as well. Overall I do not know that the wound has completely cleared of infection at this point. Fortunately there is no signs of active infection systemically at this point which is good news. 09/20/2019 upon evaluation today patient's wound actually appears to be doing about the same based on what I am seeing currently. I do not see any signs of systemic infection he still does have evidence of some local infection and drainage. He did see Dr. Megan Salon last week and Dr. Megan Salon states that he probably does need a different IV antibiotic although he does not want to put him on this until the patient begins the Remicade infusion which is actually scheduled for about 10 days out from today on 13 May. Following that time Dr. Megan Salon is good to see him back and then will  evaluate the feasibility of starting him on the IV antibiotic therapy once again at that point. I do not disagree with this plan I do believe as Dr. Megan Salon stated in his note that I reviewed today that the patient's issue is multifactorial with the pyoderma being 1 aspect of this that were hoping the Remicade will be helpful for her. In the meantime I think the gentamicin is, helping to keep things under decent okay control in regard to the ulcer. 09/27/2019 upon evaluation today patient appears to be doing about the same with regard to his wound still there is a lot of muscle exposure though he does have some hyper granulation tissue noted around the edge and actually some granulation tissue starting to form over the muscle which is actually good news. Fortunately there is no evidence of active infection which is also good news. His pain is less at this point. 5/21; this is a patient I have not seen in a long time. 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 He has a large wound on the left lateral leg with protruding muscle. He comes in the clinic today showing the same area on his left medial ankle. He says there is been a spot there for some time although we have not previously defined this. Today he has a clearly defined area with slight amount of skin breakdown surrounded by raised areas with a purplish hue in color. This is not painful he  says it is irritated. 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 He still has muscle protruding from the lateral portion of his left leg but fortunately the new area that they were concerned about at his last visit does not appear to have opened at this point. He is currently on Remicade infusions and seems to be doing better in my opinion in fact the wound itself seems to be overall much better. The purplish  discoloration that he did have seems to have resolved and I think that is a good sign that hopefully the Remicade is doing its job. He does have some biofilm noted over the surface of the wound. 11/01/2019 on evaluation today patient's wound actually appears to be doing excellent at this time. Fortunately there is no evidence of active infection and overall I feel like he is making great progress. The Remicade seems to be due excellent job in my opinion. HY, SWIATEK ([id number]) 11/08/19 evaluation today vision actually appears to be doing quite well with regard to his weight ulcer. He's been tolerating dressing changes without complication. Fortunately there is no evidence of infection. No fevers, chills, nausea, or vomiting noted at this time. Overall states that is having more itching than pain which is actually a good sign in my opinion. 12/13/2019 upon evaluation today patient appears to be doing well today with regard to his wound. He has been tolerating the dressing changes without complication. Fortunately there is no sign of active infection at this time. No fevers, chills, nausea, vomiting, or diarrhea. Overall I feel like the infusion therapy has been very beneficial for him. 01/06/2020 on evaluation today patient appears to be doing well with regard to his wound. This is measuring smaller and actually looks to be doing better. Fortunately there is no signs of active infection at this point. No fevers, chills, nausea, vomiting, or diarrhea. With that being said he does still have the blue-green drainage but this does not seem to be causing any significant issues currently. He has been using the gentamicin that does seem to be keeping things under decent control at this point. He goes later this morning for his next infusion therapy for the pyoderma which seems to also be very beneficial. 02/07/2020 on evaluation today patient appears to be doing about the same in regard to his wounds  currently. Fortunately there is no signs of active infection systemically he does still have evidence of local infection still using gentamicin. He also is showing some signs of improvement albeit slowly I do feel like we are making some progress here. 02/21/2020 upon evaluation today patient appears to be making some signs of improvement the wound is measuring a little bit smaller which is great news and overall I am very pleased with where he stands currently. He is going to be having infusion therapy treatment on the 15th of this month. Fortunately there is no signs of active infection at this time. 03/13/2020 I do believe patient's wound is actually showing some signs of improvement here which is great news. He has continue with the infusion therapy through rheumatology/dermatology at [facility]. That does seem to be beneficial. I still think he gets as much benefit from this as he did from the prednisone initially but nonetheless obviously this is less harsh on his body that the prednisone as far as they are concerned. 03/31/2020 on evaluation today patient's wound actually showing signs of some pretty  good improvement in regard to the overall appearance of the wound bed. There is still muscle exposed though he does have some epithelial growth around the edges of the wound. Fortunately there is no signs of active infection at this time. No fevers, chills, nausea, vomiting, or diarrhea. 04/24/2020 upon evaluation today patient appears to be doing about the same in regard to his leg ulcer. He has been tolerating the dressing changes without complication. Fortunately there is no signs of active infection at this time. No fevers, chills, nausea, vomiting, or diarrhea. With that being said he still has a lot of irritation from the bandaging around the edges of the wound. We did discuss today the possibility of a referral to plastic surgery. 05/22/2020 on evaluation today patient appears to be doing well with  regard to his wounds all things considered. He has not been able to get the Chantix apparently there is a recall nurse that I was unaware of put out by [employer] involuntarily. Nonetheless for now I am and I have to do some research into what may be the best option for him to help with quitting in regard to smoking and we discussed that today. 06/26/2020 upon evaluation today patient appears to be doing well with regard to his wound from the standpoint of infection I do not see any signs of infection at this point. With that being said unfortunately he is still continuing to have issues with muscle exposure and again he is not having a whole lot of new skin growth unfortunately. There does not appear to be any signs of active infection at this time. No fevers, chills, nausea, vomiting, or diarrhea. 07/10/2020 upon evaluation today patient appears to be doing a little bit more poorly currently compared to where he was previous. I am concerned currently about an active infection that may be getting worse especially in light of the increased size and tenderness of the wound bed. No fevers, chills, nausea, vomiting, or diarrhea. 07/24/2020 upon evaluation today patient appears to be doing poorly in regard to his leg ulcer. He has been tolerating the dressing changes without complication but unfortunately is having a lot of discomfort. 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 Subsequently I think he is going require possibly IV antibiotics to get this under control. I am very concerned about the severity of his infection and the amount of discomfort he is having. 07/31/2020 upon evaluation today patient appears to be doing about the same in regard to his leg wound. He did see Dr. Megan Salon and Dr. Megan Salon is actually going to start him on IV antibiotics. He goes for the PICC line tomorrow. With that being said there do not have that run for  2 weeks and then see how things are doing and depending on how he is progressing they may extend that a little longer. Nonetheless I am glad this is getting ready to be in place and definitely feel it may help the patient. In the meantime is been using mainly triamcinolone to the wound bed has an anti-inflammatory. 08/07/2020 on evaluation today patient appears to be doing well with regard to his wound compared even last week. In the interim he has gotten the PICC line placed and overall this seems to be doing excellent. There does not appear to be any evidence of infection which is great news systemically although locally of course has had the infection this appears to be improving with the use of  the antibiotics. Electronic Signature(s) Signed: 08/07/2020 1:12:42 PM By: Worthy Keeler PA-C Previous Signature: 08/07/2020 1:12:21 PM Version By: Worthy Keeler PA-C Entered By: Worthy Keeler on 08/07/2020 13:12:42 [name], [name] ([id number]) -------------------------------------------------------------------------------- Physical Exam Details Patient Name: [name] Date of Service: 08/07/2020 8:00 AM Medical Record Number: [id number] Patient Account Number: [id number] Date of Birth/Sex: June 29, 1980 (39 y.o. M) Treating RN: Carlene Coria Primary Care Provider: [name] Other Clinician: Jeanine Luz Referring Provider: [name] Treating Provider/Extender: [name] Cliche in Treatment: 44 Constitutional Well-nourished and well-hydrated in no acute distress. Respiratory normal breathing without difficulty. Psychiatric this patient is able to make decisions and demonstrates good insight into disease process. Alert and Oriented x 3. pleasant and cooperative. Notes With regard to the patient's wound bed again he still does have muscle exposed this is quite significant. With that being said he also has some erythema noted around the edges of the wound but not nearly to the  degree that it was noted prior. I do feel like that he is actually making good progress. I do think that the PICC line the Dr. Megan Salon placed has done an excellent job even in just such a short time so far. Still this patient has a long way to go to get this anywhere close to healing. Electronic Signature(s) Signed: 08/07/2020 1:13:03 PM By: Worthy Keeler PA-C Entered By: Worthy Keeler on 08/07/2020 13:13:03 [name], [name] ([id number]) -------------------------------------------------------------------------------- Physician Orders Details Patient Name: [name] Date of Service: 08/07/2020 8:00 AM Medical Record Number: [id number] Patient Account Number: [id number] Date of Birth/Sex: May 10, 1981 (39 y.o. 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 Edema Control - Lymphedema / Segmental Compressive Device / Other o Tubigrip double layer applied - F Wound Treatment Wound #1 - Lower Leg Wound Laterality: Left, Lateral Cleanser: Soap and Water 1 x Per Day/30 Days Discharge Instructions: Gently cleanse wound with antibacterial soap, rinse and pat dry prior to dressing wounds Topical: Triamcinolone Acetonide Cream, 0.1%, 15 (g) tube 1 x Per Day/30 Days Discharge Instructions: to wound bed , lightly Primary Dressing: Santyl Collagenase Ointment, 30 (gm), tube 1  x Per Day/30 Days Discharge Instructions: apply to 8oclock area Secondary Dressing: Mepilex Border Flex, 6x6 (in/in) 1 x Per Day/30 Days Discharge Instructions: Apply to wound as directed. Do not cut. Electronic Signature(s) Signed: 08/07/2020 4:49:49 PM By: Worthy Keeler PA-C Signed: 08/25/2020 8:11:40 AM By: Carlene Coria RN Entered By: Carlene Coria on 08/07/2020 08:38:26 [name], [name] ([id number]) -------------------------------------------------------------------------------- Problem List Details Patient Name: [name] Date of Service: 08/07/2020 8:00 AM Medical Record Number: [id number] Patient Account Number: [id number] Date of Birth/Sex: 16-Jan-1981 (40 y.o. M) Treating RN: Carlene Coria Primary Care Provider: [name] Other Clinician: Jeanine Luz Referring Provider: [name] Treating Provider/Extender: [name] Cliche in Treatment: [phone] Active Problems ICD-10 Encounter Code Description Active Date MDM Diagnosis L97.222 Non-pressure chronic ulcer of left calf with fat layer exposed 12/04/2016 No Yes L88 Pyoderma gangrenosum 03/26/2017 No Yes L97.321 Non-pressure chronic ulcer of left ankle limited  to breakdown of skin 10/08/2019 No Yes I87.2 Venous insufficiency (chronic) (peripheral) 12/04/2016 No Yes L03.116 Cellulitis of left lower limb 05/24/2019 No Yes F17.208 Nicotine dependence, unspecified, with other nicotine-induced disorders 04/24/2020 No Yes Inactive Problems ICD-10 Code Description Active Date Inactive Date L97.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 M) Treating RN: Carlene Coria Primary Care Provider: [name] Other Clinician: Jeanine Luz Referring Provider: [name] Treating Provider/Extender: [name] Cliche in Treatment: 191 Subjective Chief Complaint Information obtained from Patient He is here in follow up evaluation for LLE pyoderma ulcer History of Present Illness (HPI) 12/04/16; 40 year old man who comes into the clinic today for review of a wound on the posterior left calf. He tells me that is been there for about a year. He is not a diabetic he does smoke half a pack per day. He was seen in the ER on 11/20/16 felt to have cellulitis around the wound and was given clindamycin. An x-ray did not show osteomyelitis. The patient initially tells me that he has a milk allergy that sets off a pruritic itching rash on his lower legs which she scratches incessantly and he thinks that's what may have set up the wound. He has been using various topical antibiotics and ointments without any effect. He works in a [employer]ing [employer] and is on his feet all day. He does not have a prior history of wounds however he does have the rash on both lower legs the right arm and the ventral aspect of his left arm. These are excoriations and clearly have had scratching however there are of macular looking areas on both legs including a substantial larger area on the right leg. This does not have an underlying open area. There is no blistering. The patient tells me that 2 years ago in [place] in response to the rash on his legs he saw a dermatologist who told him he had a condition which may be pyoderma gangrenosum although I may be putting words into his mouth. He seemed to recognize this. 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 He is not in any treatment for this. He's had no recent travel 12/11/16; the  patient arrives today with his wound and roughly the same condition we've been using silver alginate this is a deep punched out wound with some surrounding erythema but no tenderness. Biopsy I did did not show confirmed pyoderma gangrenosum suggested nonspecific inflammation and vasculitis but does not provide an actual description of what was seen by the pathologist. I'm really not able to understand this We have also received information from the patient's dermatologist in [place] notes from April 2016. This was a doctor Agarwal-antal. 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 He was prescribed topical steroid high potency under occlusion which helped but at this point the patient did not have a deep punched out wound. 12/18/16; the patient's wound is larger in terms of surface area however this surface looks better and there is less depth. The surrounding erythema also is better. The patient states that the wrap we put on came off 2 days ago when he has been  using his compression stockings. He we are in the process of getting a dermatology consult. 12/26/16 on evaluation today patient's left lower extremity wound shows evidence of infection with surrounding erythema noted. He has been tolerating the dressing changes but states that he has noted more discomfort. There is a larger area of erythema surrounding the wound. No fevers, chills, nausea, or vomiting noted at this time. With that being said the wound still does have slough covering the surface. He is not allergic to any medication that he is aware of at this point. In regard to his right lower extremity he had several regions that are erythematous and pruritic he wonders if there's anything we can do to help that. 01/02/17 I reviewed patient's wound culture which was obtained his visit last week. He was placed on doxycycline at that point. Unfortunately that does not appear to be an antibiotic that would likely help with the  situation however the pseudomonas noted on culture is sensitive to Cipro. Also unfortunately patient's wound seems to have a large compared to last week's evaluation. Not severely so but there are definitely increased measurements in general. He is continuing to have discomfort as well he writes this to be a seven out of 10. In fact he would prefer me not to perform any debridement today due to the fact that he is having discomfort and considering he has an active infection on the little reluctant to do so anyway. No fevers, chills, nausea, or vomiting noted at this time. 01/08/17; patient seems dermatology on September 5. I suspect dermatology will want the slides from the biopsy I did sent to their pathologist. I'm not sure if there is a way we can expedite that. In any case the culture I did before I left on vacation 3 weeks ago showed Pseudomonas he was given 10 days of Cipro and per her description of her intake nurses is actually somewhat better this week although the wound is quite a bit bigger than I remember the last time I saw this. He still has 3 more days of Cipro 01/21/17; dermatology appointment tomorrow. He has completed the ciprofloxacin for Pseudomonas. Surface of the wound looks better however he is had some deterioration in the lesions on his right leg. Meantime the left lateral leg wound we will continue with sample 01/29/17; patient had his dermatology appointment but I can't yet see that note. He is completed his antibiotics. The wound is more superficial but considerably larger in circumferential area than when he came in. This is in his left lateral calf. He also has swollen erythematous areas with superficial wounds on the right leg and small papular areas on both arms. There apparently areas in her his upper thighs and buttocks I did not look at those. Dermatology biopsied the right leg. Hopefully will have their input next week. 02/05/17; patient went back to see his  dermatologist who told him that he had a "scratching problem" as well as staph. He is now on a 30 day course of doxycycline and I believe she gave him triamcinolone cream to the right leg areas to help with the itching [not exactly sure but probably triamcinolone]. She apparently looked at the left lateral leg wound although this was not rebiopsied and I think felt to be ultimately part of the same pathogenesis. He is using sample border foam and changing nevus himself. He now has a new open area on the right posterior leg which was his biopsy site I don't have any  of the dermatology notes 02/12/17; we put the patient in compression last week with SANTYL to the wound on the left leg and the biopsy. Edema is much better and the depth of the wound is now at level of skin. Area is still the same Biopsy site on the right lateral leg we've also been using santyl with a border foam dressing and he is changing this himself. 02/19/17; Using silver alginate started last week to both the substantial left leg wound and the biopsy site on the right wound. He is tolerating compression well. Has a an appointment with his primary M.D. tomorrow wondering about diuretics although I'm wondering if the edema problem is actually lymphedema [name], [name] ([id number]) 02/26/17; the patient has been to see his primary doctor Dr. Jerrel Ivory at [place] our primary care. She started him on Lasix 20 mg and this seems to have helped with the edema. However we are not making substantial change with the left lateral calf wound and inflammation. The biopsy site on the right leg also looks stable but not really all that different. 03/12/17; the patient has been to see vein and vascular Dr. Lucky Cowboy. He has had venous reflux studies I have not reviewed these. I did get a call from his dermatology office. They felt that he might have pathergy based on their biopsy on his right leg which led them to look at the slides of the biopsy I did  on the left leg and they wonder whether this represents pyoderma gangrenosum which was the original supposition in a man with ulcerative colitis albeit inactive for many years. They therefore recommended clobetasol and tetracycline i.e. 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 03/26/17; apparently the patient just had reflux studies not an appointment with Dr. dew. She arrives in clinic today having applied clobetasol for 2-3 weeks. He notes over the last 2-3 days excessive drainage having to change the dressing 3-4 times a day and also expanding erythema. 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 Lillia Carmel at [facility] dermatology locally who kindly saw him at my request. 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 My concern was the degree of erythema medially extending from his left leg wound which was either inflammation from pyoderma or cellulitis. I put him on Augmentin however culture of the wound showed Pseudomonas which is quinolone sensitive. I really don't believe he has cellulitis however in view of everything I will continue and give him a course of Cipro. He is also on doxycycline as an immune modulator for the pyoderma. In addition to his original wound on the left lateral leg with surrounding erythema he has a wound on the right posterior calf which was an original biopsy site done by dermatology. 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 Saw Dr. Lillia Carmel yesterday. He has been using topical antibiotics to both wound areas his original wound on the left and the biopsies/pathergy area on the right. There is  definitely some improvement in the inflammation around the wound on the right although the patient states he has increasing sensitivity of the wounds. He is on prednisone 60 and doxycycline 1 as prescribed by Dr. Lillia Carmel. He is covering the  topical antibiotic with gauze and putting this in his own compression stocks and changing this daily. He states that Dr. Lottie Rater did a culture of the left leg wound yesterday 05/07/17; pyoderma gangrenosum. The patient saw Dr. Lillia Carmel yesterday and has a follow-up with her in one month. He is still using topical antibiotics to both wounds although he can't recall exactly what type. He is still on prednisone 60 mg. Dr. Lillia Carmel stated that the doxycycline could stop if we were in agreement. 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 The right medial leg was induced by biopsy/pathergy. The area on the right is essentially healed. 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 The right medial leg has closed and remains closed. He is still on prednisone 60. He tells me he missed his last dermatology appointment with Dr. Lillia Carmel but will make another appointment. He reports that her blood sugar at a recent screen in [place] was high 200's. He was 180 today. 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 I think he is going to require still much longer prednisone perhaps another 3 months before attempting to taper. In the meantime his wound is a lot better. Smaller. He is cleaning this off daily and applying topical antibiotics. When he was last in the clinic I thought about changing to [facility] and actually put in a couple of calls to dermatology although probably not during their business hours. In any case the wound looks better smaller I don't think there is any  need to change what he is doing 08/06/17-he is here in follow up evaluation for pyoderma left leg ulcer. He continues on oral prednisone. He has been using triple antibiotic ointment. There is surface debris and we will transition to [facility] and have him return in 2 weeks. He has lost 30 pounds since his last appointment with lifestyle modification. He may benefit from topical steroid cream for treatment this can be considered at a later date. 08/22/17 on evaluation today patient appears to actually be doing rather well in regard to his left lateral lower extremity ulcer. He has actually been managed by Dr. Dellia Nims most recently. Patient is currently on oral steroids at this time. This seems to have been of benefit for him. Nonetheless his last visit was actually with Leah on 08/06/17. Currently he is not utilizing any topical steroid creams although this could be of benefit as well. No fevers, chills, nausea, or vomiting noted at this time. 09/05/17 on evaluation today patient appears to be doing better in regard to his left lateral lower extremity ulcer. He has been tolerating the dressing changes without complication. He is using Santyl with good effect. Overall I'm very pleased with how things are standing at this point. Patient likewise is happy that this is doing better. 09/19/17 on evaluation today patient actually appears to be doing rather well in regard to his left lateral lower extremity ulcer. 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 He's not having any significant pain. 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 He has been tolerating the Santyl without complication and in general I feel like he's making good progress. 10/17/17 on  evaluation today patient appears to be doing very well in regard to his left  lateral lower surety ulcer. He has been tolerating the dressing changes without complication. There does not appear to be any evidence of infection he's alternating the Santyl and the triple antibiotic ointment every other day this seems to be doing well for him. 11/03/17 on evaluation today patient appears to be doing very well in regard to his left lateral lower extremity ulcer. He is been tolerating the dressing changes without complication which is good news. Fortunately there does not appear to be any evidence of infection which is also great news. Overall is doing excellent they are starting to taper down on the prednisone is down to 40 mg at this point it also started topical clobetasol for him. 11/17/17 on evaluation today patient appears to be doing well in regard to his left lateral lower surety ulcer. He's been tolerating the dressing changes without complication. He does note that he is having no pain, no excessive drainage or discharge, and overall he feels like things are going about how he would expect and hope they would. Overall he seems to have no evidence of infection at this time in my opinion which is good news. 12/04/17-He is seen in follow-up evaluation for right lateral lower extremity ulcer. He has been applying topical steroid cream. Today's measurement show slight increase in size. Over the next 2 weeks we will transition to every other day Santyl and steroid cream. He has been encouraged to monitor for changes and notify clinic with any concerns 12/15/17 on evaluation today patient's left lateral motion the ulcer and fortunately is doing worse again at this point. This just since last week to this week has close to doubled in size according to the patient. I did not seeing last week's I do not have a visual to compare this to in our system was also down so we do not have all the charts and at this point. Nonetheless it does have me somewhat concerned in regard to the fact  that again he was worried enough about it he has contact the dermatology that placed them back on the full strength, 50 mg a day of the prednisone that he was taken previous. He continues to alternate using clobetasol along with Santyl at this point. He is obviously somewhat frustrated. [name], [name] ([id number]) 12/22/17 on evaluation today patient appears to be doing a little worse compared to last evaluation. Unfortunately the wound is a little deeper and slightly larger than the last week's evaluation. With that being said he has made some progress in regard to the irritation surrounding at this time unfortunately despite that progress that's been made he still has a significant issue going on here. 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 For that reason I discussed with him today the possibility of perform a wound culture to ensure there's nothing overtly infected. 01/06/18 on evaluation today patient's wound is larger and deeper than previously evaluated. With that being said it did appear that his wound was infected after my last evaluation with him. Subsequently I did end up prescribing a prescription for Bactrim DS which she has been taking and having no complication with. Fortunately there does not appear to be any evidence of infection at this point in time as far as anything spreading, no want to touch, and overall  I feel like things are showing signs of improvement. 01/13/18 on evaluation today patient appears to be even a little larger and deeper than last time. There still muscle exposed in the base of the wound. Nonetheless he does appear to be less erythematous I do believe inflammation is calming down also believe the infection looks like it's probably resolved at this time based on what I'm seeing. No fevers, chills, nausea, or  vomiting noted at this time. 01/30/18 on evaluation today patient actually appears to visually look better for the most part. Unfortunately those visually this looks better he does seem to potentially have what may be an abscess in the muscle that has been noted in the central portion of the wound. This is the first time that I have noted what appears to be fluctuance in the central portion of the muscle. With that being said I'm somewhat more concerned about the fact that this might indicate an abscess formation at this location. I do believe that an ultrasound would be appropriate. This is likely something we need to try to do as soon as possible. He has been switch to mupirocin ointment and he is no longer using the steroid ointment as prescribed by dermatology he sees them again next week he's been decreased from 60 to 40 mg of prednisone. 03/09/18 on evaluation today patient actually appears to be doing a little better compared to last time I saw him. There's not as much erythema surrounding the wound itself. He I did review his most recent infectious disease note which was dated 02/24/18. He saw Dr. Michel Bickers in [place]. 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 Patient is now off of antibiotics and they are continually observing this. There seems to be no change in the past two weeks in my pinion based on what the patient says and what I see today compared to what Dr. Megan Salon likely saw two weeks ago. No fevers, chills, nausea, or vomiting noted at this time. 03/23/18 on evaluation today patient's wound actually appears to be showing signs of improvement which is good news. He is currently still on the Dapsone. He is also working on tapering the prednisone to get off of this and Dr. Lottie Rater is working with him in this regard. Nonetheless overall I feel like the wound is doing well it does appear  based on the infectious disease note that I reviewed from Dr. 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 I did also review the results of his ultrasound of left lower extremity which revealed there was a dentist tissue in the base of the wound without an abscess noted. 04/06/18 on evaluation today the patient's left lateral lower extremity ulcer actually appears to be doing fairly well which is excellent news. There does not appear to be any evidence of infection at this time which is also great news. Overall he still does have a significantly large ulceration although little by little he seems to be making progress. He is down to 10 mg a day of the prednisone. 04/20/18 on evaluation today patient actually appears to be doing excellent at this time in regard to his left lower extremity ulcer. He's making signs of good progress unfortunately this is taking much longer than we would really like to see  but nonetheless he is making progress. Fortunately there does not appear to be any evidence of infection at this time. No fevers, chills, nausea, or vomiting noted at this time. The patient has not been using the Santyl due to the cost he hadn't got in this field yet. He's mainly been using the antibiotic ointment topically. Subsequently he also tells me that he really has not been scrubbing in the shower I think this would be helpful again as I told him it doesn't have to be anything too aggressive to even make it believe just enough to keep it free of some of the loose slough and biofilm on the wound surface. 05/11/18 on evaluation today patient's wound appears to be making slow but sure progress in regard to the left lateral lower extremity ulcer. He is been tolerating the dressing changes without complication. Fortunately there does not appear to be any evidence of infection at this time. He  is still just using triple antibiotic ointment along with clobetasol occasionally over the area. He never got the Santyl and really does not seem to intend to in my pinion. 06/01/18 on evaluation today patient appears to be doing a little better in regard to his left lateral lower extremity ulcer. He states that overall he does not feel like he is doing as well with the Dapsone as he did with the prednisone. Nonetheless he sees his dermatologist later today and is gonna talk to them about the possibility of going back on the prednisone. Overall again I believe that the wound would be better if you would utilize Santyl but he really does not seem to be interested in going back to the [place] at this point. He has been using triple antibiotic ointment. 06/15/18 on evaluation today patient's wound actually appears to be doing about the same at this point. Fortunately there is no signs of infection at this time. He has made slight improvements although he continues to not really want to clean the wound bed at this point. He states that he just doesn't mess with it he doesn't want to cause any problems with everything else he has going on. He has been on medication, antibiotics as prescribed by his dermatologist, for a staff infection of his lower extremities which is really drying out now and looking much better he tells me. Fortunately there is no sign of overall infection. 06/29/18 on evaluation today patient appears to be doing well in regard to his left lateral lower surety ulcer all things considering. Fortunately his staff infection seems to be greatly improved compared to previous. He has no signs of infection and this is drying up quite nicely. He is still the doxycycline for this is no longer on cental, Dapsone, or any of the other medications. His dermatologist has recommended possibility of an infusion but right now he does not want to proceed with that. 07/13/18 on evaluation today patient appears  to be doing about the same in regard to his left lateral lower surety ulcer. Fortunately there's no signs of infection at this time which is great news. Unfortunately he still builds up a significant amount of Slough/biofilm of the surface of the wound he still is not really cleaning this as he should be appropriately. Again I'm able to easily with saline and gauze remove the majority of this on the surface which if you would do this at home would likely be a dramatic improvement for him as far as getting the area to improve. Nonetheless overall I  still feel like he is making progress is just very slow. I think Santyl will be of benefit for him as well. Still he has not gotten this as of this point. 07/27/18 on evaluation today patient actually appears to be doing little worse in regards of the erythema around the periwound region of the wound he also tells me that he's been having more drainage currently compared to what he was experiencing last time I saw him. He states not quite as bad as what he had because this was infected previously but nonetheless is still appears to be doing poorly. Fortunately there is no evidence of systemic infection at this point. The patient tells me that he is not going to be able to afford the Santyl. He is still waiting to hear about the infusion therapy with his dermatologist. Apparently she wants an updated colonoscopy first. [name], [name] ([id number]) 08/10/18 on evaluation today patient appears to be doing better in regard to his left lateral lower extremity ulcer. Fortunately he is showing signs of improvement in this regard he's actually been approved for Remicade infusion's as well although this has not been scheduled as of yet. Fortunately there's no signs of active infection at this time in regard to the wound although he is having some issues with infection of the right lower extremity is been seen as dermatologist for this. Fortunately they are definitely still  working with him trying to keep things under control. 09/07/18 on evaluation today patient is actually doing rather well in regard to his left lateral lower extremity ulcer. He notes these actually having some hair grow back on his extremity which is something he has not seen in years. He also tells me that the pain is really not giving them any trouble at this time which is also good news overall she is very pleased with the progress he's using a combination of the mupirocin along with the probate is all mixed. 09/21/18 on evaluation today patient actually appears to be doing fairly well all things considered in regard to his looks from the ulcer. He's been tolerating the dressing changes without complication. Fortunately there's no signs of active infection at this time which is good news he is still on all antibiotics or prevention of the staff infection. He has been on prednisone for time although he states it is gonna contact his dermatologist and see if she put them on a short course due to some irritation that he has going on currently. Fortunately there's no evidence of any overall worsening this is going very slow I think cental would be something that would be helpful for him although he states that $50 for tube is quite expensive. He therefore is not willing to get that at this point. 10/06/18 on evaluation today patient actually appears to be doing decently well in regard to his left lateral leg ulcer. He's been tolerating the dressing changes without complication. Fortunately there's no signs of active infection at this time. Overall I'm actually rather pleased with the progress he's making although it's slow he doesn't show any signs of infection and he does seem to be making some improvement. I do believe that he may need a switch up and dressings to try to help this to heal more appropriately and quickly. 10/19/18 on evaluation today patient actually appears to be doing better in regard to  his left lateral lower extremity ulcer. This is shown signs of having much less Slough buildup at this point due to the fact  he has been using the Santyl. Obviously this is very good news. The overall size of the wound is not dramatically smaller but again the appearance is. 11/02/18 on evaluation today patient actually appears to be doing quite well in regard to his lower [place] ulcer. A lot of the skin around the ulcer is actually somewhat irritating at this point this seems to be more due to the dressing causing irritation from the adhesive that anything else. Fortunately there is no signs of active infection at this time. 11/24/18 on evaluation today patient appears to be doing a little worse in regard to his overall appearance of his lower extremity ulcer. There's more erythema and warmth around the wound unfortunately. He is currently on doxycycline which he has been on for some time. With that being said I'm not sure that seems to be helping with what appears to possibly be an acute cellulitis with regard to his left lower extremity ulcer. No fevers, chills, nausea, or vomiting noted at this time. 12/08/18 on evaluation today patient's wounds actually appears to be doing significantly better compared to his last evaluation. He has been using Santyl along with alternating tripling about appointment as well as the steroid cream seems to be doing quite well and the wound is showing signs of improvement which is excellent news. Fortunately there's no evidence of infection and in fact his culture came back negative with only normal skin flora noted. 12/21/2018 upon evaluation today patient actually appears to be doing excellent with regard to his ulcer. This is actually the best that I have seen it since have been helping to take care of him. It is both smaller as well as less slough noted on the surface of the wound and seems to be showing signs of good improvement with new skin growing from the edges.  He has been using just the triamcinolone he does wonder if he can get a refill of that ointment today. 01/04/2019 upon evaluation today patient actually appears to be doing well with regard to his left lateral lower extremity ulcer. With that being said it does not appear to be that he is doing quite as well as last time as far as progression is concerned. There does not appear to be any signs of infection or significant irritation which is good news. With that being said I do believe that he may benefit from switching to a collagen based dressing based on how clean The wound appears. 01/18/2019 on evaluation today patient actually appears to be doing well with regard to his wound on the left lower extremity. He is not made a lot of progress compared to where we were previous but nonetheless does seem to be doing okay at this time which is good news. There is no signs of active infection which is also good news. My only concern currently is I do wish we can get him into utilizing the collagen dressing his insurance would not pay for the supplies that we ordered although it appears that he may be able to order this through his supply company that he typically utilizes. This is [employer]. Nonetheless he did try to order it during the office visit today and it appears this did go through. We will see if he can get that it is a different brand but nonetheless he has collagen and I do think will be beneficial. 02/01/2019 on evaluation today patient actually appears to be doing a little worse today in regard to the overall size of his  wounds. Fortunately there is no signs of active infection at this time. That is visually. Nonetheless when this is happened before it was due to infection. For that reason were somewhat concerned about that this time as well. 02/08/2019 on evaluation today patient unfortunately appears to be doing slightly worse with regard to his wound upon evaluation today. Is measuring a little  deeper and a little larger unfortunately. I am not really sure exactly what is causing this to enlarge he actually did see his dermatologist she is going to see about initiating Humira for him. Subsequently she also did do steroid injections into the wound itself in the periphery. Nonetheless still nonetheless he seems to be getting a little bit larger he is gone back to just using the steroid cream topically which I think is appropriate. I would say hold off on the collagen for the time being is definitely a good thing to do. Based on the culture results which we finally did get the final result back regarding it shows staph as the bacteria noted again that can be a normal skin bacteria based on the fact however he is having increased drainage and worsening of the wound measurement wise I would go ahead and place him on an antibiotic today I do believe for this. 02/15/2019 on evaluation today patient actually appears to be doing somewhat better in regard to his ulcer. There is no signs of worsening at this time I did review his culture results which showed evidence of Staphylococcus aureus but not MRSA. Again this could just be more related to the normal skin bacteria although he states the drainage has slowed down quite a bit he may have had a mild infection not just colonization. And was much smaller and then since around10/04/2019 on evaluation today patient appears to be doing unfortunately worse as far as the size of the wound. I really feel like that this is steadily getting larger again it had been doing excellent right at the beginning of September we have seen a steady increase in the area of the wound it is almost 2-1/2 times the size it was on September 1. Obviously this is a bad trend this is not wanting to see. For that reason we went back to using just the topical triamcinolone cream which does seem to help with inflammation. I checked him for bacteria by way of culture and nothing showed  positive there. I am considering giving him a short course of a tapering steroid Terrion Gencarelli, Azariah ([id number]) today to see if that is can be beneficial for him. The patient is in agreement with giving that a try. 03/08/2019 on evaluation today patient appears to be doing very well in comparison to last evaluation with regard to his lower extremity ulcer. This is showing signs of less inflammation and actually measuring slightly smaller compared to last time every other week over the past month and a half he has been measuring larger larger larger. Nonetheless I do believe that the issue has been inflammation the prednisone does seem to have been beneficial for him which is good news. No fevers, chills, nausea, vomiting, or diarrhea. 03/22/2019 on evaluation today patient appears to be doing about the same with regard to his leg ulcer. He has been tolerating the dressing changes without complication. With that being said the wound seems to be mostly arrested at its current size but really is not making any progress except for when we prescribed the prednisone. He did show some signs of  dropping as far as the overall size of the wound during that interval week. Nonetheless this is something he is not on long-term at this point and unfortunately I think he is getting need either this or else the Humira which his dermatologist has discussed try to get approval for. With that being said he will be seeing his dermatologist on the 11th of this month that is November. 04/19/2019 on evaluation today patient appears to be doing really about the same the wound is measuring slightly larger compared to last time I saw him. He has not been into the office since November 2 due to the fact that he unfortunately had Covid as that his entire family. He tells me that it was rough but they did pull-through and he seems to be doing much better. Fortunately there is no signs of active infection at this time.  No fevers, chills, nausea, vomiting, or diarrhea. 05/10/2019 on evaluation today patient unfortunately appears to be doing significantly worse as compared to last time I saw him. He does tell me that he has had his first dose of Humira and actually is scheduled to get the next one in the upcoming week. With that being said he tells me also that in the past several days he has been having a lot of issues with green drainage she showed me a picture this is more blue-green in color. He is also been having issues with increased sloughy buildup and the wound does appear to be larger today. Obviously this is not the direction that we want everything to take based on the starting of his Humira. Nonetheless I think this is definitely a result of likely infection and to be honest I think this is probably Pseudomonas causing the infection based on what I am seeing. 05/24/2019 on evaluation today patient unfortunately appears to be doing significantly worse compared to his prior evaluation with me 2 weeks ago. I did review his culture results which showed that he does have Staph aureus as well as Pseudomonas noted on the culture. Nonetheless the Levaquin that I prescribed for him does not appear to have been appropriate and in fact he tells me he is no longer experiencing the green drainage and discharge that he had at the last visit. Fortunately there is no signs of active infection at this time which is good news although the wound has significantly worsened it in fact is much deeper than it was previous. We have been utilizing up to this point triamcinolone ointment as the prescription topical of choice but at this time I really feel like that the wound is getting need to be packed in order to appropriately manage this due to the deeper nature of the wound. Therefore something along the lines of an alginate dressing may be more appropriate. 05/31/2019 upon inspection today patient's wound actually showed signs  of doing poorly at this point. Unfortunately he just does not seem to be making any good progress despite what we have tried. He actually did go ahead and pick up the Cipro and start taking that as he was noticing more green drainage he had previously completed the Levaquin that I prescribed for him as well. Nonetheless he missed his appointment for the seventh last week on Wednesday with the wound care center and [facility] where his dermatologist referred him. Obviously I do think a second opinion would be helpful at this point especially in light of the fact that the patient seems to be doing so  poorly despite the fact that we have tried everything that I really know how at this point. The only thing that ever seems to have helped him in the past is when he was on high doses of continual steroids that did seem to make a difference for him. 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 06/07/2019 upon evaluation today patient unfortunately appears to be doing worse yet again with regard to his wound. In fact I am starting to question whether or not he may have a fluid pocket in the muscle at this point based on the bulging and the soft appearance to the central portion of the muscle area. There is not anything draining from the muscle itself at this time which is good news but nonetheless the wound is expanding. I am not really seeing any results of the Humira as far as overall wound progression based on what I am seeing at this point. The patient has been referred for second opinion with regard to his wound to the [facility] wound care center by his dermatologist which I definitely am not in opposition to. Unfortunately we tried multiple dressings in the past including collagen, alginate, and at one point even Hydrofera Blue. With that being said he  is never really used it for any significant amount of time due to the fact that he often complains of pain associated with these dressings and then will go back to either using the Santyl which she has done intermittently or more frequently the triamcinolone. He is also using his own compression stockings. We have wrapped him in the past but again that was something else that he really was not a big fan of. 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 He seemed to do much better when he was on the steroids although honestly I understand that the steroids are not necessarily the best medication to be on long-term obviously 06/14/2019 on evaluation today patient appears to be doing actually a little bit better with regard to the overall appearance with his leg. Unfortunately he does continue to have issues with what appears to be some fluid underneath the muscle although he did see the wound specialty center at [facility] last week their main goals were to see about infusion therapy in place of the Humira as they feel like that is not quite strong enough. They also recommended that we continue with the treatment otherwise as we are they felt like that was appropriate and they are okay with him continuing to follow-up here with [place] in that regard. With that being said they are also sending him to the vein specialist there to see about vein stripping and if that would be of benefit for him. Subsequently they also did not really address whether or not an ultrasound of the muscle area to see if there is anything that needs to be addressed here would be appropriate or not. For that reason I discussed  this with him last  week I think we may proceed down that road at this point. 06/21/2019 upon evaluation today patient's wound actually appears to be doing slightly better compared to previous evaluations. I do believe that he has made a difference with regard to the progression here with the use of oral steroids. Again in the past has been the only thing that is really calm things down. He does tell me that from [facility] is gotten a good news from there that there are no further vein stripping that is necessary at this point. I do not have that available for review today although the patient did relay this to me. He also did obtain and have the ultrasound of the wound completed which I did sign off on today. It does appear that there is no fluid collection under the muscle this is likely then just edematous tissue in general. That is also good news. Overall I still believe the inflammation is the main issue here. He did inquire about the possibility of a wound VAC again with the muscle protruding like it is I am not really sure whether the wound VAC is necessarily ideal or not. That is something we will have to consider although I do believe he may need compression wrapping to try to help with edema control which could potentially be of benefit. 06/28/2019 on evaluation today patient appears to be doing slightly better measurement wise although this is not terribly smaller he least seems to be trending towards that direction. With that being said he still seems to have purulent drainage noted in the wound bed at this time. He has been on Levaquin followed by Cipro over the past month. Unfortunately he still seems to have some issues with active infection at this time. I did perform a culture last week in order to evaluate and see if indeed there was still anything going on. Subsequently the culture did come back [place], Selby ([id number]) showing Pseudomonas which is consistent with the drainage has been  having which is blue-green in color. He also has had an odor that again was somewhat consistent with Pseudomonas as well. Long story short it appears that the culture showed an intermediate finding with regard to how well the Cipro will work for the Pseudomonas infection. Subsequently being that he does not seem to be clearing up and at best what we are doing is just keeping this at [place] I think he may need to see infectious disease to discuss IV antibiotic options. 07/05/2019 upon evaluation today patient appears to be doing okay in regard to his leg ulcer. He has been tolerating the dressing changes at this point without complication. Fortunately there is no signs of active infection at this time which is good news. No fevers, chills, nausea, vomiting, or diarrhea. With that being said he does have an appointment with infectious disease tomorrow and his primary care on Wednesday. Again the reason for the infectious disease referral was due to the fact that he did not seem to be fully resolving with the use of oral antibiotics and therefore we were thinking that IV antibiotic therapy may be necessary secondary to the fact that there was an intermediate finding for how effective the Cipro may be. Nonetheless again he has been having a lot of purulent and even green drainage. Fortunately right now that seems to have calmed down over the past week with the reinitiation of the oral antibiotic. Nonetheless we will see what Dr. Megan Salon has to say. 07/12/2019 upon evaluation today patient  appears to be doing about the same at this point in regard to his left lower extremity ulcer. Fortunately there is no signs of active infection at this time which is good news I do believe the Levaquin has been beneficial I did review Dr. Hale Bogus note and to be honest I agree that the patient's leg does appear to be doing better currently. What we found in the past as he does not seem to really completely resolve he will  stop the antibiotic and then subsequently things will revert back to having issues with blue-green drainage, increased pain, and overall worsening in general. Obviously that is the reason I sent him back to infectious disease. 07/19/2019 upon evaluation today patient appears to be doing roughly the same in size there is really no dramatic improvement. He has started back on the Levaquin at this point and though he seems to be doing okay he did still have a lot of blue/green drainage noted on evaluation today unfortunately. I think that this is still indicative more likely of a Pseudomonas infection as previously noted and again he does see Dr. Megan Salon in just a couple of days. I do not know that were really able to effectively clear this with just oral antibiotics alone based on what I am seeing currently. Nonetheless we are still continue to try to manage as best we can with regard to the patient and his wound. I do think the wrap was helpful in decreasing the edema which is excellent news. No fevers, chills, nausea, vomiting, or diarrhea. 07/26/2019 upon evaluation today patient appears to be doing slightly better with regard to the overall appearance of the muscle there is no dark discoloration centrally. Fortunately there is no signs of active infection at this time. No fevers, chills, nausea, vomiting, or diarrhea. Patient's wound bed currently the patient did have an appointment with Dr. Megan Salon at infectious disease last week. With that being said Dr. Megan Salon the patient states was still somewhat hesitant about put him on any IV antibiotics he wanted [place] to repeat cultures today and then see where things go going forward. He does look like Dr. Megan Salon because of some improvement the patient did have with the Levaquin wanted [place] to see about repeating cultures. If it indeed grows the Pseudomonas again then he recommended a possibility of considering a PICC line placement and IV antibiotic therapy.  He plans to see the patient back in 1 to 2 weeks. 08/02/2019 upon evaluation today patient appears to be doing poorly with regard to his left lower extremity. We did get the results of his culture back it shows that he is still showing evidence of Pseudomonas which is consistent with the purulent/blue-green drainage that he has currently. Subsequently the culture also shows that he now is showing resistance to the oral fluoroquinolones which is unfortunate as that was really the only thing to treat the infection prior. I do believe that he is looking like this is going require IV antibiotic therapy to get this under control. Fortunately there is no signs of systemic infection at this time which is good news. The patient does see Dr. Megan Salon tomorrow. 08/09/2019 upon evaluation today patient appears to be doing better with regard to his left lower extremity ulcer in regard to the overall appearance. He is currently on IV antibiotic therapy. As ordered by Dr. Megan Salon. Currently the patient is on ceftazidime which she is going to take for the next 2 weeks and then follow-up for 4 to 5-week  appointment with Dr. Megan Salon. The patient started this this past Friday symptoms have not for a total of 3 days currently in full. 08/16/2019 upon evaluation today patient's wound actually does show muscle in the base of the wound but in general does appear to be much better as far as the overall evidence of infection is concerned. In fact I feel like this is for the most part cleared up he still on the IV antibiotics he has not completed the full course yet but I think he is doing much better which is excellent news. 08/23/2019 upon evaluation today patient appears to be doing about the same with regard to his wound at this point. He tells me that he still has pain unfortunately. Fortunately there is no evidence of systemic infection at this time which is great news. There is significant muscle protrusion. 09/13/19 upon  evaluation today patient appears to be doing about the same in regard to his leg unfortunately. He still has a lot of drainage coming from the ulceration there is still muscle exposed. With that being said the patient's last wound culture still showed an intermediate finding with regard to the Pseudomonas he still having the bluish/green drainage as well. Overall I do not know that the wound has completely cleared of infection at this point. Fortunately there is no signs of active infection systemically at this point which is good news. 09/20/2019 upon evaluation today patient's wound actually appears to be doing about the same based on what I am seeing currently. I do not see any signs of systemic infection he still does have evidence of some local infection and drainage. He did see Dr. Megan Salon last week and Dr. Megan Salon states that he probably does need a different IV antibiotic although he does not want to put him on this until the patient begins the Remicade infusion which is actually scheduled for about 10 days out from today on 13 May. Following that time Dr. Megan Salon is good to see him back and then will evaluate the feasibility of starting him on the IV antibiotic therapy once again at that point. I do not disagree with this plan I do believe as Dr. Megan Salon stated in his note that I reviewed today that the patient's issue is multifactorial with the pyoderma being 1 aspect of this that were hoping the Remicade will be helpful for her. In the meantime I think the gentamicin is, helping to keep things under decent okay control in regard to the ulcer. 09/27/2019 upon evaluation today patient appears to be doing about the same with regard to his wound still there is a lot of muscle exposure though he does have some hyper granulation tissue noted around the edge and actually some granulation tissue starting to form over the muscle which is actually good news. Fortunately there is no evidence of active  infection which is also good news. His pain is less at this point. 5/21; this is a patient I have not seen in a long time. 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 He has a large wound on the left lateral leg with protruding muscle. He comes in the clinic today showing the same area on his left medial ankle. He says there is been a spot there for some time although we have not previously defined this. Today he has a clearly defined area with slight amount of skin breakdown surrounded by raised areas with a purplish hue in color. This is not painful he  says it is irritated. 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 He still has muscle protruding from the lateral portion of his left leg but fortunately the new area that they were concerned about at his last visit does not appear to have opened at this point. He is currently on Remicade infusions and seems to be doing better in my opinion in fact the wound itself seems to be overall much better. The purplish discoloration that he did have seems to have resolved and I think that is a good sign that hopefully the Remicade is doing its job. He does Hoot, Cyree ([id number]) have some biofilm noted over the surface of the wound. 11/01/2019 on evaluation today patient's wound actually appears to be doing excellent at this time. Fortunately there is no evidence of active infection and overall I feel like he is making great progress. The Remicade seems to be due excellent job in my opinion. 11/08/19 evaluation today vision actually appears to be doing quite well with regard to his weight ulcer. He's been tolerating dressing changes without complication. Fortunately there is no evidence of infection. No fevers, chills, nausea, or vomiting noted at this time. Overall states that is having more itching than pain which  is actually a good sign in my opinion. 12/13/2019 upon evaluation today patient appears to be doing well today with regard to his wound. He has been tolerating the dressing changes without complication. Fortunately there is no sign of active infection at this time. No fevers, chills, nausea, vomiting, or diarrhea. Overall I feel like the infusion therapy has been very beneficial for him. 01/06/2020 on evaluation today patient appears to be doing well with regard to his wound. This is measuring smaller and actually looks to be doing better. Fortunately there is no signs of active infection at this point. No fevers, chills, nausea, vomiting, or diarrhea. With that being said he does still have the blue-green drainage but this does not seem to be causing any significant issues currently. He has been using the gentamicin that does seem to be keeping things under decent control at this point. He goes later this morning for his next infusion therapy for the pyoderma which seems to also be very beneficial. 02/07/2020 on evaluation today patient appears to be doing about the same in regard to his wounds currently. Fortunately there is no signs of active infection systemically he does still have evidence of local infection still using gentamicin. He also is showing some signs of improvement albeit slowly I do feel like we are making some progress here. 02/21/2020 upon evaluation today patient appears to be making some signs of improvement the wound is measuring a little bit smaller which is great news and overall I am very pleased with where he stands currently. He is going to be having infusion therapy treatment on the 15th of this month. Fortunately there is no signs of active infection at this time. 03/13/2020 I do believe patient's wound is actually showing some signs of improvement here which is great news. He has continue with the infusion therapy through rheumatology/dermatology at [facility]. That does seem to  be beneficial. I still think he gets as much benefit from this as he did from the prednisone initially but nonetheless obviously this is less harsh on his body that the prednisone as far as they are concerned. 03/31/2020 on evaluation today patient's wound actually showing signs of some pretty  good improvement in regard to the overall appearance of the wound bed. There is still muscle exposed though he does have some epithelial growth around the edges of the wound. Fortunately there is no signs of active infection at this time. No fevers, chills, nausea, vomiting, or diarrhea. 04/24/2020 upon evaluation today patient appears to be doing about the same in regard to his leg ulcer. He has been tolerating the dressing changes without complication. Fortunately there is no signs of active infection at this time. No fevers, chills, nausea, vomiting, or diarrhea. With that being said he still has a lot of irritation from the bandaging around the edges of the wound. We did discuss today the possibility of a referral to plastic surgery. 05/22/2020 on evaluation today patient appears to be doing well with regard to his wounds all things considered. He has not been able to get the Chantix apparently there is a recall nurse that I was unaware of put out by [employer] involuntarily. Nonetheless for now I am and I have to do some research into what may be the best option for him to help with quitting in regard to smoking and we discussed that today. 06/26/2020 upon evaluation today patient appears to be doing well with regard to his wound from the standpoint of infection I do not see any signs of infection at this point. With that being said unfortunately he is still continuing to have issues with muscle exposure and again he is not having a whole lot of new skin growth unfortunately. There does not appear to be any signs of active infection at this time. No fevers, chills, nausea, vomiting, or diarrhea. 07/10/2020 upon  evaluation today patient appears to be doing a little bit more poorly currently compared to where he was previous. I am concerned currently about an active infection that may be getting worse especially in light of the increased size and tenderness of the wound bed. No fevers, chills, nausea, vomiting, or diarrhea. 07/24/2020 upon evaluation today patient appears to be doing poorly in regard to his leg ulcer. He has been tolerating the dressing changes without complication but unfortunately is having a lot of discomfort. 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 Subsequently I think he is going require possibly IV antibiotics to get this under control. I am very concerned about the severity of his infection and the amount of discomfort he is having. 07/31/2020 upon evaluation today patient appears to be doing about the same in regard to his leg wound. He did see Dr. Megan Salon and Dr. Megan Salon is actually going to start him on IV antibiotics. He goes for the PICC line tomorrow. With that being said there do not have that run for 2 weeks and then see how things are doing and depending on how he is progressing they may extend that a little longer. Nonetheless I am glad this is getting ready to be in place and definitely feel it may help the patient. In the meantime is been using mainly triamcinolone to the wound bed has an anti-inflammatory. 08/07/2020 on evaluation today patient appears to be doing well with regard to his wound compared even last week. In the interim he has gotten the PICC line placed and overall this seems to be doing excellent. There does not appear to be any evidence of infection which is great news systemically although locally of course has had the infection this appears to be improving with the use of the  antibiotics. Objective Stettler, Kenji ([id number]) Constitutional Well-nourished and well-hydrated in no acute  distress. Vitals Time Taken: 8:15 AM, Height: 71 in, Weight: 338 lbs, BMI: 47.1, Temperature: 98.2 F, Pulse: 79 bpm, Respiratory Rate: 18 breaths/min, Blood Pressure: 144/81 mmHg. Respiratory normal breathing without difficulty. Psychiatric this patient is able to make decisions and demonstrates good insight into disease process. Alert and Oriented x 3. pleasant and cooperative. General Notes: With regard to the patient's wound bed again he still does have muscle exposed this is quite significant. With that being said he also has some erythema noted around the edges of the wound but not nearly to the degree that it was noted prior. I do feel like that he is actually making good progress. I do think that the PICC line the Dr. Megan Salon placed has done an excellent job even in just such a short time so far. Still this patient has a long way to go to get this anywhere close to healing. Integumentary (Hair, Skin) Wound #1 status is Open. Original cause of wound was Gradually Appeared. The date acquired was: 11/18/2015. The wound has been in treatment 191 weeks. The wound is located on the Left,Lateral Lower Leg. The wound measures 6.3cm length x 6cm width x 0.9cm depth; 29.688cm^2 area and 26.719cm^3 volume. There is muscle and Fat Layer (Subcutaneous Tissue) exposed. There is no tunneling or undermining noted. There is a small amount of purulent drainage noted. The wound margin is epibole. There is medium (34-66%) red, pink granulation within the wound bed. There is a medium (34-66%) amount of necrotic tissue within the wound bed including Adherent Slough. 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 Edema Control - Lymphedema / Segmental Compressive Device / Other: Tubigrip double layer applied - F WOUND #1: - Lower Leg Wound Laterality: Left, Lateral Cleanser: Soap and Water 1 x Per Day/30 Days Discharge Instructions: Gently cleanse wound with antibacterial soap, rinse and pat dry prior to dressing wounds Topical: Triamcinolone Acetonide Cream, 0.1%, 15 (g) tube 1 x Per Day/30 Days Discharge Instructions: to wound bed , lightly Primary Dressing: Santyl Collagenase Ointment, 30 (gm), tube 1 x Per Day/30 Days Discharge Instructions: apply to 8oclock area Secondary Dressing: Mepilex Border Flex, 6x6 (in/in) 1 x Per Day/30 Days Discharge Instructions: Apply to wound as directed. Do not cut. 1. Would recommend at this time that we actually go ahead and consider going forward with checking on Apligraf I think this would be a good option for the patient. I would like to check this first as I feel like it would be the ideal optimal thing to use. With that being said if that is not a possibility that we may proceed with a different type of skin substitute possible even Oasis being that we would have a larger size that would fit the wound we will see. 2. I am also can recommend for the patient continue with the triamcinolone to the wound bed along with Santyl to help keep it clean although I think he is going to be ready to transition more to just the triamcinolone to be honest. 3. I am also can recommend border foam to cover followed by his compression sock to keep things under control as far as swelling is  concerned. We will see patient back for reevaluation in 1 week here in the clinic. If anything worsens or changes patient will contact our office for additional recommendations. GASPAR, FOWLE ([id number]) Electronic Signature(s) Signed: 08/07/2020 1:14:00 PM By: Worthy Keeler PA-C Entered By: Worthy Keeler on  08/07/2020 13:14:00 [name] ([id number]) -------------------------------------------------------------------------------- SuperBill Details Patient Name: [name] Date of Service: 08/07/2020 Medical Record Number: [id number] Patient Account Number: [id number] Date of Birth/Sex: [birth year] (40 y.o. M) Treating RN: Carlene Coria Primary Care Provider: [name] Other Clinician: Jeanine Luz Referring Provider: [name] Treating Provider/Extender: [name] Cliche in Treatment: 191 Diagnosis Coding ICD-10 Codes Code Description [phone] Non-pressure chronic ulcer of left calf with fat layer exposed L88 Pyoderma gangrenosum L97.321 Non-pressure chronic ulcer of left ankle limited to breakdown of skin I87.2 Venous insufficiency (chronic) (peripheral) L03.116 Cellulitis of left lower limb F17.208 Nicotine dependence, unspecified, with other nicotine-induced disorders Facility Procedures CPT4 Code: [id number] Description: 99213 - WOUND CARE VISIT-LEV 3 EST PT Modifier: Quantity: 1 Physician Procedures CPT4 Code: 4801655 Description: 37482 - WC PHYS LEVEL 3 - EST PT Modifier: Quantity: 1 CPT4 Code: Description: ICD-10 Diagnosis Description L97.222 Non-pressure chronic ulcer of left calf with fat layer exposed L88 Pyoderma gangrenosum L97.321 Non-pressure chronic ulcer of left ankle limited to breakdown of skin L03.116 Cellulitis of left lower limb Modifier: Quantity: Electronic Signature(s) Signed: 08/07/2020 1:14:16 PM By: Worthy Keeler PA-C Entered By: Worthy Keeler on 08/07/2020 13:14:16

## 2020-08-08 ENCOUNTER — Ambulatory Visit (INDEPENDENT_AMBULATORY_CARE_PROVIDER_SITE_OTHER): Payer: 59 | Admitting: Internal Medicine

## 2020-08-08 ENCOUNTER — Encounter: Payer: Self-pay | Admitting: Internal Medicine

## 2020-08-08 DIAGNOSIS — A498 Other bacterial infections of unspecified site: Secondary | ICD-10-CM

## 2020-08-08 NOTE — Assessment & Plan Note (Signed)
There may be slight improvement in his wound on therapy for possible Pseudomonas superinfection.  He will continue cefepime and follow-up in 1 week.

## 2020-08-08 NOTE — Progress Notes (Signed)
[facility]  Patient Active Problem List   Diagnosis Date Noted  . Wound infection 07/25/2020    Priority: High  . Pseudomonas infection 07/25/2020    Priority: High  . 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 Essential hypertension 06/28/2020  . Normocytic anemia 07/07/2019  . Tobacco abuse 06/17/2018  . Ulcerative colitis ([place]) 03/05/2018  . Venous stasis 02/24/2018  . Chronic pain of left knee 12/05/2017  . Chronic pain of left ankle 12/05/2017  . Type 2 diabetes mellitus ([place]) 07/15/2017  . Seasonal allergic rhinitis 11/25/2016  . Depression 04/17/2015  . Hyperlipidemia 04/17/2015    Patient's Medications  New Prescriptions   No medications on file  Previous Medications   ACETAMINOPHEN (TYLENOL) 500 MG TABLET    Take 1,000 mg by mouth every 6 (six) hours as needed for headache (pain).   ATORVASTATIN (LIPITOR) 40 MG TABLET    Take 1 tablet (40 mg total) by mouth daily. For cholesterol.   BLOOD GLUCOSE METER KIT AND SUPPLIES KIT    Dispense based on patient and insurance preference. Use up to four times daily as directed. (FOR ICD-9 250.00, 250.01).   CEFEPIME 2 G IN SODIUM CHLORIDE 0.9 % 100 ML    Inject 2 g into the vein every 12 (twelve) hours.   CLOBETASOL OINTMENT (TEMOVATE) 0.05 %    Apply 1 application topically 2 (two) times daily.    DULAGLUTIDE (TRULICITY) 4.92 EF/0.0FH SOPN    Inject 0.75 mg into the skin once a week. For diabetes.   FLUTICASONE (FLONASE) 50 MCG/ACT NASAL SPRAY    Place 1 spray into both nostrils 2 (two) times daily.   IBUPROFEN (ADVIL,MOTRIN) 200 MG TABLET    Take 400-600 mg by mouth every 6 (six) hours as needed for headache (pain).   INFLIXIMAB (REMICADE) 100 MG INJECTION    Inject into the muscle once a week.   INSULIN GLARGINE (LANTUS SOLOSTAR) 100 UNIT/ML SOLOSTAR PEN    Inject 30 Units into the skin at bedtime.   METFORMIN (GLUCOPHAGE) 1000 MG TABLET    TAKE 1 TABLET BY MOUTH TWICE DAILY WITH  MEALS FOR  DIABETES   MUPIROCIN OINTMENT (BACTROBAN) 2 %    Apply topically two (2) 60g times a day. Mix with clobetasol and apply twice daily   RELION PEN NEEDLES 31G X 6 MM MISC    USE NIGHTLY WITH INSULIN AS DIRECTED   SANTYL OINTMENT    Apply topically.   TRIAMCINOLONE OINTMENT (KENALOG) 0.1 %    Apply topically.   VARENICLINE (CHANTIX) 1 MG TABLET    Take 1/2 tablet daily for three days, then 1/2 tablet twice daily for five days, then 1 tablet twice daily thereafter for smoking cessation.   VENLAFAXINE XR (EFFEXOR-XR) 150 MG 24 HR CAPSULE    Take 1 capsule (150 mg total) by mouth daily with breakfast. For depression.  Modified Medications   No medications on file  Discontinued Medications   No medications on file    Subjective: [name] is in for his routine follow-up visit.  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  He has now been on Remicade every 6 weeks for the past 11 months.  He felt like things were getting better in January of this year but has started to have more wound breakdown and drainage.  When he was seen at the wound center on 07/10/2020 "no infection" was noted but cultures were obtained which grew Pseudomonas resistant to quinolones and gentamicin.    He had a PICC placed and started cefepime on 08/01/2020.  He has not had any problems tolerating the PICC or cefepime.  He feels like his wound is looking better.  Review of Systems: Review of Systems  Gastrointestinal: Positive for constipation. Negative for abdominal pain, diarrhea, nausea and vomiting.    Past Medical History:  Diagnosis Date  . Blood in stool   . Depression   . Elevated blood pressure   . Hyperlipidemia   . OSA (obstructive sleep apnea) 2014  . Seasonal allergies   . Ulcerative colitis ([place]) 03/05/2018    Social History   Tobacco Use  . Smoking status: Current Every Day Smoker    Packs/day:  1.00    Years: 14.00    Pack years: 14.00    Types: Cigarettes    Last attempt to quit: 11/25/2018    Years since quitting: 1.7  . Smokeless tobacco: Former Systems developer    Quit date: 03/17/2015  Vaping Use  . Vaping Use: Never used  Substance Use Topics  . Alcohol use: Not Currently    Alcohol/week: 0.0 standard drinks    Comment: soical    Family History  Problem Relation Age of Onset  . Alcohol abuse Paternal Aunt   . Alcohol abuse Paternal Uncle   . Stroke Paternal Uncle   . Hyperlipidemia Maternal Grandfather   . Hypertension Maternal Grandfather   . Diabetes Maternal Grandfather   . Alcohol abuse Maternal Grandfather   . Multiple sclerosis Mother   . Dementia Mother     Allergies  Allergen Reactions  . Biaxin [Clarithromycin] Other (See Comments)    Causes colitis flares  . Milk-Related Compounds Hives    Objective: Vitals:   08/08/20 1508  BP: 137/83  Pulse: (!) 109  Temp: 97.7 F (36.5 C)  TempSrc: Oral  SpO2: 95%  Weight: (!) 345 lb (156.5 kg)   Body mass index is 48.12 kg/m.  Physical Exam Constitutional:      Comments: His spirits are good.  Cardiovascular:     Rate and Rhythm: Normal rate.  Pulmonary:     Effort: Pulmonary effort is normal.  Skin:    Comments: His chronic ulcer remains about 7 cm in diameter with satellite ulceration in the 7 o'clock position and some undermining around the rim in the 2 o'clock position.  The surrounding skin is not as discolored.  There is no malodor.  Psychiatric:        Mood and Affect: Mood normal.    Photo 07/25/2020    Photo today    Lab Results    Problem List Items Addressed This Visit      High   Pseudomonas infection    There may be slight improvement in his wound on therapy for possible Pseudomonas superinfection.  He will continue cefepime and follow-up in 1 week.          Michel Bickers, MD [facility] [phone] pager   [phone]  cell 08/08/2020, 3:25 PM

## 2020-08-14 ENCOUNTER — Encounter: Payer: 59 | Admitting: Physician Assistant

## 2020-08-14 ENCOUNTER — Other Ambulatory Visit: Payer: Self-pay

## 2020-08-14 DIAGNOSIS — L88 Pyoderma gangrenosum: Secondary | ICD-10-CM | POA: Diagnosis not present

## 2020-08-14 NOTE — Progress Notes (Addendum)
OWYN, RAULSTON ([id number]) Visit Report for 08/14/2020 Chief Complaint Document Details Patient Name: [name], [name] Date of Service: 08/14/2020 8:15 AM Medical Record Number: [id number] Patient Account Number: [id number] Date of Birth/Sex: 1980/06/23 (40 y.o. 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 M) Treating RN: Carlene Coria Primary Care Provider: [name] Other Clinician: Jeanine Luz Referring Provider: [name] Treating Provider/Extender: [name] Cliche in Treatment: 192 History of Present Illness HPI Description: 12/04/16; 40 year old man who comes into the clinic today for review of a wound on the posterior left calf. He tells me that is been there for about a year. He is not a diabetic he does smoke half a pack per day. He was seen in the ER on 11/20/16 felt to have cellulitis around the wound and was given clindamycin. An x-ray did not show osteomyelitis. The patient initially tells me that he has a milk allergy that sets off a pruritic itching rash on his lower legs which she scratches incessantly and he thinks that's what may have set up the wound. He has been using various topical antibiotics and ointments without any effect. He works in a [employer] and is on his feet all day.  He does not have a prior history of wounds however he does have the rash on both lower legs the right arm and the ventral aspect of his left arm. These are excoriations and clearly have had scratching however there are of macular looking areas on both legs including a substantial larger area on the right leg. This does not have an underlying open area. There is no blistering. The patient tells me that 2 years ago in [place] in response to the rash on his legs he saw a dermatologist who told him he had a condition which may be pyoderma gangrenosum although I may be putting words into his mouth. He seemed to recognize this. 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 He is not in any treatment for this. He's had no recent travel 12/11/16; the patient arrives today with his wound and roughly the same condition we've been using silver alginate this is a deep punched out wound with some surrounding erythema but no tenderness. Biopsy I did did not show confirmed pyoderma gangrenosum suggested nonspecific inflammation and vasculitis but does not provide an actual description of what was seen by the pathologist. I'm really not able to understand this We have also received information from the patient's dermatologist in [place] notes from April 2016. This was a doctor Agarwal-antal. 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 He was prescribed topical steroid high potency under occlusion which helped but at this point the patient did not have a deep punched out wound. 12/18/16; the patient's wound is larger in terms of surface area however this surface looks better and there is less depth. The surrounding erythema also is better. The patient states that the wrap we put on came off 2 days ago when he has been  using his compression stockings. He we are in the process of getting a dermatology consult. 12/26/16 on evaluation today patient's left lower extremity wound  shows evidence of infection with surrounding erythema noted. He has been tolerating the dressing changes but states that he has noted more discomfort. There is a larger area of erythema surrounding the wound. No fevers, chills, nausea, or vomiting noted at this time. With that being said the wound still does have slough covering the surface. He is not allergic to any medication that he is aware of at this point. In regard to his right lower extremity he had several regions that are erythematous and pruritic he wonders if there's anything we can do to help that. 01/02/17 I reviewed patient's wound culture which was obtained his visit last week. He was placed on doxycycline at that point. Unfortunately that does not appear to be an antibiotic that would likely help with the situation however the pseudomonas noted on culture is sensitive to Cipro. Also unfortunately patient's wound seems to have a large compared to last week's evaluation. Not severely so but there are definitely increased measurements in general. He is continuing to have discomfort as well he writes this to be a seven out of 10. In fact he would prefer me not to perform any debridement today due to the fact that he is having discomfort and considering he has an active infection on the little reluctant to do so anyway. No fevers, chills, nausea, or vomiting noted at this time. 01/08/17; patient seems dermatology on September 5. I suspect dermatology will want the slides from the biopsy I did sent to their pathologist. I'm not sure if there is a way we can expedite that. In any case the culture I did before I left on vacation 3 weeks ago showed Pseudomonas he was given 10 days of Cipro and per her description of her intake nurses is actually somewhat better this week although the wound is quite a bit bigger than I remember the last time I saw this. He still has 3 more days of Cipro 01/21/17; dermatology appointment tomorrow. He has completed  the ciprofloxacin for Pseudomonas. Surface of the wound looks better however he is had some deterioration in the lesions on his right leg. Meantime the left lateral leg wound we will continue with sample 01/29/17; patient had his dermatology appointment but I can't yet see that note. He is completed his antibiotics. The wound is more superficial but considerably larger in circumferential area than when he came in. This is in his left lateral calf. He also has swollen erythematous areas with superficial wounds on the right leg and small papular areas on both arms. There apparently areas in her his upper thighs and buttocks I did not look at those. Dermatology biopsied the right leg. Hopefully will have their input next week. 02/05/17; patient went back to see his dermatologist who told him that he had a "scratching problem" as well as staph. He is now on a 30 day course of doxycycline and I believe she gave him triamcinolone cream to the right leg areas to help with the itching [not exactly sure but probably triamcinolone]. She apparently looked at the left lateral leg wound although this was not rebiopsied and I think felt to be ultimately part of the same pathogenesis. He is using sample border foam and changing nevus himself. He now has a new open area on the right posterior leg which was his biopsy site I don't have any  of the dermatology notes 02/12/17; we put the patient in compression last week with SANTYL to the wound on the left leg and the biopsy. Edema is much better and the depth of the wound is now at level of skin. Area is still the same oBiopsy site on the right lateral leg we've also been using santyl with a border foam dressing and he is changing this himself. 02/19/17; Using silver alginate started last week to both the substantial left leg wound and the biopsy site on the right wound. He is tolerating compression well. Has a an appointment with his primary M.D. tomorrow wondering about  diuretics although I'm wondering if the edema problem is actually lymphedema 02/26/17; the patient has been to see his primary doctor Dr. Jerrel Ivory at [facility] our primary care. She started him on Lasix 20 mg and this seems to have helped with the edema. However we are not making substantial change with the left lateral calf wound and inflammation. The biopsy site on the right leg also looks stable but not really all that different. 03/12/17; the patient has been to see vein and vascular Dr. Lucky Cowboy. He has had venous reflux studies I have not reviewed these. I did get a call from his dermatology office. They felt that he might have pathergy based on their biopsy on his right leg which led them to look at the slides of DAMANY, EASTMAN ([id number]) the biopsy I did on the left leg and they wonder whether this represents pyoderma gangrenosum which was the original supposition in a man with ulcerative colitis albeit inactive for many years. They therefore recommended clobetasol and tetracycline i.e. 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 03/26/17; apparently the patient just had reflux studies not an appointment with Dr. dew. She arrives in clinic today having applied clobetasol for 2-3 weeks. He notes over the last 2-3 days excessive drainage having to change the dressing 3-4 times a day and also expanding erythema. 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 Lillia Carmel at [facility] dermatology locally who kindly saw him at my request. 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 My concern was the degree of erythema medially extending from his left leg wound which was either  inflammation from pyoderma or cellulitis. I put him on Augmentin however culture of the wound showed Pseudomonas which is quinolone sensitive. I really don't believe he has cellulitis however in view of everything I will continue and give him a course of Cipro. He is also on doxycycline as an immune modulator for the pyoderma. In addition to his original wound on the left lateral leg with surrounding erythema he has a wound on the right posterior calf which was an original biopsy site done by dermatology. 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 Saw Dr. Lillia Carmel yesterday. He has been using topical antibiotics to both wound areas his original wound on the left and the biopsies/pathergy area on the right. There is definitely some improvement in the inflammation around the wound on the right although the patient states he has increasing sensitivity of the wounds. He is on prednisone 60 and doxycycline 1 as prescribed by Dr. Lillia Carmel. He is covering the  topical antibiotic with gauze and putting this in his own compression stocks and changing this daily. He states that Dr. Lottie Rater did a culture of the left leg wound yesterday 05/07/17; pyoderma gangrenosum. The patient saw Dr. Lillia Carmel yesterday and has a follow-up with her in one month. He is still using topical antibiotics to both wounds although he can't recall exactly what type. He is still on prednisone 60 mg. Dr. Lillia Carmel stated that the doxycycline could stop if we were in agreement. 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 The right medial leg was induced by biopsy/pathergy. The area on the right is essentially healed. 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 The right medial leg has  closed and remains closed. He is still on prednisone 60. oHe tells me he missed his last dermatology appointment with Dr. Lillia Carmel but will make another appointment. He reports that her blood sugar at a recent screen in [place] was high 200's. He was 180 today. 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 I think he is going to require still much longer prednisone perhaps another 3 months before attempting to taper. In the meantime his wound is a lot better. Smaller. He is cleaning this off daily and applying topical antibiotics. When he was last in the clinic I thought about changing to [facility] and actually put in a couple of calls to dermatology although probably not during their business hours. 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 He continues on oral prednisone. He has been using triple antibiotic ointment. There is surface debris and we will transition to [facility] and have him return in 2 weeks. He has lost 30 pounds since his last appointment with lifestyle modification. He may benefit from topical steroid cream for treatment this can be considered at a later date. 08/22/17 on evaluation today patient appears to actually be doing rather well in regard to his left lateral lower extremity ulcer. He has actually been managed by Dr. Dellia Nims most recently. Patient is currently on oral steroids at this time. This seems to have been of benefit for him. Nonetheless his last visit was actually with Leah on 08/06/17. Currently he is not utilizing any topical steroid creams although this could be of benefit as well. No fevers, chills, nausea, or vomiting noted at this time. 09/05/17 on evaluation today patient appears to be doing better in regard to his left lateral lower extremity ulcer. He has been tolerating the dressing changes without complication. He is using Santyl  with good effect. Overall I'm very pleased with how things are standing at this point. Patient likewise is happy that this is doing better. 09/19/17 on evaluation today patient actually appears to be doing rather well in regard to his left lateral lower extremity ulcer. 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 He's not having any significant pain. 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 He has been tolerating the Santyl without complication and in general I feel like he's making good progress. 10/17/17  on evaluation today patient appears to be doing very well in regard to his left lateral lower surety ulcer. He has been tolerating the dressing changes without complication. There does not appear to be any evidence of infection he's alternating the Santyl and the triple antibiotic ointment every other day this seems to be doing well for him. 11/03/17 on evaluation today patient appears to be doing very well in regard to his left lateral lower extremity ulcer. He is been tolerating the dressing changes without complication which is good news. Fortunately there does not appear to be any evidence of infection which is also great news. Overall is doing excellent they are starting to taper down on the prednisone is down to 40 mg at this point it also started topical clobetasol for him. 11/17/17 on evaluation today patient appears to be doing well in regard to his left lateral lower surety ulcer. He's been tolerating the dressing changes without complication. He does note that he is having no pain, no excessive drainage or discharge, and overall he feels like things are going about how he would expect and hope they would. Overall he seems to have no evidence of infection at this time in my opinion which is  good news. 12/04/17-He is seen in follow-up evaluation for right lateral lower extremity ulcer. He has been applying topical steroid cream. Today's measurement show slight increase in size. Over the next 2 weeks we will transition to every other day Santyl and steroid cream. He has been encouraged to monitor for changes and notify clinic with any concerns 12/15/17 on evaluation today patient's left lateral motion the ulcer and fortunately is doing worse again at this point. This just since last week to this week has close to doubled in size according to the patient. I did not seeing last week's I do not have a visual to compare this to in our system was also down so we do not have all the charts and at this point. Nonetheless it does have me somewhat concerned in regard to the fact that again he was worried enough about it he has contact the dermatology that placed them back on the full strength, 50 mg a day of the prednisone that he was taken previous. He continues to alternate using clobetasol along with Santyl at this point. He is obviously somewhat frustrated. 12/22/17 on evaluation today patient appears to be doing a little worse compared to last evaluation. Unfortunately the wound is a little deeper and slightly larger than the last week's evaluation. With that being said he has made some progress in regard to the irritation surrounding at this time unfortunately despite that progress that's been made he still has a significant issue going on here. 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 JAQUAVIOUS, MERCER ([id number]) For that reason I discussed with him today the possibility of perform a wound culture to ensure there's nothing overtly infected. 01/06/18 on evaluation today patient's wound is larger and deeper than previously evaluated. With that being  said it did appear that his wound was infected after my last evaluation with him. Subsequently I did end up prescribing a prescription for Bactrim DS which she has been taking and having no complication with. Fortunately there does not appear to be any evidence of infection at this point in time as far as anything spreading, no want to touch, and  overall I feel like things are showing signs of improvement. 01/13/18 on evaluation today patient appears to be even a little larger and deeper than last time. There still muscle exposed in the base of the wound. Nonetheless he does appear to be less erythematous I do believe inflammation is calming down also believe the infection looks like it's probably resolved at this time based on what I'm seeing. No fevers, chills, nausea, or vomiting noted at this time. 01/30/18 on evaluation today patient actually appears to visually look better for the most part. Unfortunately those visually this looks better he does seem to potentially have what may be an abscess in the muscle that has been noted in the central portion of the wound. This is the first time that I have noted what appears to be fluctuance in the central portion of the muscle. With that being said I'm somewhat more concerned about the fact that this might indicate an abscess formation at this location. I do believe that an ultrasound would be appropriate. This is likely something we need to try to do as soon as possible. He has been switch to mupirocin ointment and he is no longer using the steroid ointment as prescribed by dermatology he sees them again next week he's been decreased from 60 to 40 mg of prednisone. 03/09/18 on evaluation today patient actually appears to be doing a little better compared to last time I saw him. There's not as much erythema surrounding the wound itself. He I did review his most recent infectious disease note which was dated 02/24/18. He saw Dr. Michel Bickers in [place].  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 Patient is now off of antibiotics and they are continually observing this. There seems to be no change in the past two weeks in my pinion based on what the patient says and what I see today compared to what Dr. Megan Salon likely saw two weeks ago. No fevers, chills, nausea, or vomiting noted at this time. 03/23/18 on evaluation today patient's wound actually appears to be showing signs of improvement which is good news. He is currently still on the Dapsone. He is also working on tapering the prednisone to get off of this and Dr. Lottie Rater is working with him in this regard. Nonetheless overall I feel like the wound is doing well it does appear based on the infectious disease note that I reviewed from Dr. 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 I did also review the results of his ultrasound of left lower extremity which revealed there was a dentist tissue in the base of the wound without an abscess noted. 04/06/18 on evaluation today the patient's left lateral lower extremity ulcer actually appears to be doing fairly well which is excellent news. There does not appear to be any evidence of infection at this time which is also great news. Overall he still does have a significantly large ulceration although little by little he seems to be making progress. He is down to 10 mg a day of the prednisone. 04/20/18 on evaluation today patient actually appears to be doing excellent at this time in regard to his left lower extremity ulcer. He's making signs of good progress unfortunately this is taking much longer than we would really like to see  but nonetheless he is making progress. Fortunately there does not appear to be any evidence of infection at this time. No  fevers, chills, nausea, or vomiting noted at this time. The patient has not been using the Santyl due to the cost he hadn't got in this field yet. He's mainly been using the antibiotic ointment topically. Subsequently he also tells me that he really has not been scrubbing in the shower I think this would be helpful again as I told him it doesn't have to be anything too aggressive to even make it believe just enough to keep it free of some of the loose slough and biofilm on the wound surface. 05/11/18 on evaluation today patient's wound appears to be making slow but sure progress in regard to the left lateral lower extremity ulcer. He is been tolerating the dressing changes without complication. Fortunately there does not appear to be any evidence of infection at this time. He is still just using triple antibiotic ointment along with clobetasol occasionally over the area. He never got the Santyl and really does not seem to intend to in my pinion. 06/01/18 on evaluation today patient appears to be doing a little better in regard to his left lateral lower extremity ulcer. He states that overall he does not feel like he is doing as well with the Dapsone as he did with the prednisone. Nonetheless he sees his dermatologist later today and is gonna talk to them about the possibility of going back on the prednisone. Overall again I believe that the wound would be better if you would utilize Santyl but he really does not seem to be interested in going back to the Centuria at this point. He has been using triple antibiotic ointment. 06/15/18 on evaluation today patient's wound actually appears to be doing about the same at this point. Fortunately there is no signs of infection at this time. He has made slight improvements although he continues to not really want to clean the wound bed at this point. He states that he just doesn't mess with it he doesn't want to cause any problems with everything else he has going  on. He has been on medication, antibiotics as prescribed by his dermatologist, for a staff infection of his lower extremities which is really drying out now and looking much better he tells me. Fortunately there is no sign of overall infection. 06/29/18 on evaluation today patient appears to be doing well in regard to his left lateral lower surety ulcer all things considering. Fortunately his staff infection seems to be greatly improved compared to previous. He has no signs of infection and this is drying up quite nicely. He is still the doxycycline for this is no longer on cental, Dapsone, or any of the other medications. His dermatologist has recommended possibility of an infusion but right now he does not want to proceed with that. 07/13/18 on evaluation today patient appears to be doing about the same in regard to his left lateral lower surety ulcer. Fortunately there's no signs of infection at this time which is great news. Unfortunately he still builds up a significant amount of Slough/biofilm of the surface of the wound he still is not really cleaning this as he should be appropriately. Again I'm able to easily with saline and gauze remove the majority of this on the surface which if you would do this at home would likely be a dramatic improvement for him as far as getting the area to improve. Nonetheless overall I  still feel like he is making progress is just very slow. I think Santyl will be of benefit for him as well. Still he has not gotten this as of this point. 07/27/18 on evaluation today patient actually appears to be doing little worse in regards of the erythema around the periwound region of the wound he also tells me that he's been having more drainage currently compared to what he was experiencing last time I saw him. He states not quite as bad as what he had because this was infected previously but nonetheless is still appears to be doing poorly. Fortunately there is no evidence  of systemic infection at this point. The patient tells me that he is not going to be able to afford the Santyl. He is still waiting to hear about the infusion therapy with his dermatologist. Apparently she wants an updated colonoscopy first. 08/10/18 on evaluation today patient appears to be doing better in regard to his left lateral lower extremity ulcer. Fortunately he is showing signs of improvement in this regard he's actually been approved for Remicade infusion's as well although this has not been scheduled as of yet. Fortunately there's no signs of active infection at this time in regard to the wound although he is having some issues with infection of the right lower extremity is been seen as dermatologist for this. Fortunately they are definitely still working with him trying to keep things under control. [name], [name] ([id number]) 09/07/18 on evaluation today patient is actually doing rather well in regard to his left lateral lower extremity ulcer. He notes these actually having some hair grow back on his extremity which is something he has not seen in years. He also tells me that the pain is really not giving them any trouble at this time which is also good news overall she is very pleased with the progress he's using a combination of the mupirocin along with the probate is all mixed. 09/21/18 on evaluation today patient actually appears to be doing fairly well all things considered in regard to his looks from the ulcer. He's been tolerating the dressing changes without complication. Fortunately there's no signs of active infection at this time which is good news he is still on all antibiotics or prevention of the staff infection. He has been on prednisone for time although he states it is gonna contact his dermatologist and see if she put them on a short course due to some irritation that he has going on currently. Fortunately there's no evidence of any overall worsening this is going very slow  I think cental would be something that would be helpful for him although he states that $50 for tube is quite expensive. He therefore is not willing to get that at this point. 10/06/18 on evaluation today patient actually appears to be doing decently well in regard to his left lateral leg ulcer. He's been tolerating the dressing changes without complication. Fortunately there's no signs of active infection at this time. Overall I'm actually rather pleased with the progress he's making although it's slow he doesn't show any signs of infection and he does seem to be making some improvement. I do believe that he may need a switch up and dressings to try to help this to heal more appropriately and quickly. 10/19/18 on evaluation today patient actually appears to be doing better in regard to his left lateral lower extremity ulcer. This is shown signs of having much less Slough buildup at this point due to the fact  he has been using the Santyl. Obviously this is very good news. The overall size of the wound is not dramatically smaller but again the appearance is. 11/02/18 on evaluation today patient actually appears to be doing quite well in regard to his lower [place] ulcer. A lot of the skin around the ulcer is actually somewhat irritating at this point this seems to be more due to the dressing causing irritation from the adhesive that anything else. Fortunately there is no signs of active infection at this time. 11/24/18 on evaluation today patient appears to be doing a little worse in regard to his overall appearance of his lower extremity ulcer. There's more erythema and warmth around the wound unfortunately. He is currently on doxycycline which he has been on for some time. With that being said I'm not sure that seems to be helping with what appears to possibly be an acute cellulitis with regard to his left lower extremity ulcer. No fevers, chills, nausea, or vomiting noted at this time. 12/08/18 on  evaluation today patient's wounds actually appears to be doing significantly better compared to his last evaluation. He has been using Santyl along with alternating tripling about appointment as well as the steroid cream seems to be doing quite well and the wound is showing signs of improvement which is excellent news. Fortunately there's no evidence of infection and in fact his culture came back negative with only normal skin flora noted. 12/21/2018 upon evaluation today patient actually appears to be doing excellent with regard to his ulcer. This is actually the best that I have seen it since have been helping to take care of him. It is both smaller as well as less slough noted on the surface of the wound and seems to be showing signs of good improvement with new skin growing from the edges. He has been using just the triamcinolone he does wonder if he can get a refill of that ointment today. 01/04/2019 upon evaluation today patient actually appears to be doing well with regard to his left lateral lower extremity ulcer. With that being said it does not appear to be that he is doing quite as well as last time as far as progression is concerned. There does not appear to be any signs of infection or significant irritation which is good news. With that being said I do believe that he may benefit from switching to a collagen based dressing based on how clean The wound appears. 01/18/2019 on evaluation today patient actually appears to be doing well with regard to his wound on the left lower extremity. He is not made a lot of progress compared to where we were previous but nonetheless does seem to be doing okay at this time which is good news. There is no signs of active infection which is also good news. My only concern currently is I do wish we can get him into utilizing the collagen dressing his insurance would not pay for the supplies that we ordered although it appears that he may be able to order this  through his supply company that he typically utilizes. This is [employer]. Nonetheless he did try to order it during the office visit today and it appears this did go through. We will see if he can get that it is a different brand but nonetheless he has collagen and I do think will be beneficial. 02/01/2019 on evaluation today patient actually appears to be doing a little worse today in regard to the overall size of  his wounds. Fortunately there is no signs of active infection at this time. That is visually. Nonetheless when this is happened before it was due to infection. For that reason were somewhat concerned about that this time as well. 02/08/2019 on evaluation today patient unfortunately appears to be doing slightly worse with regard to his wound upon evaluation today. Is measuring a little deeper and a little larger unfortunately. I am not really sure exactly what is causing this to enlarge he actually did see his dermatologist she is going to see about initiating Humira for him. Subsequently she also did do steroid injections into the wound itself in the periphery. Nonetheless still nonetheless he seems to be getting a little bit larger he is gone back to just using the steroid cream topically which I think is appropriate. I would say hold off on the collagen for the time being is definitely a good thing to do. Based on the culture results which we finally did get the final result back regarding it shows staph as the bacteria noted again that can be a normal skin bacteria based on the fact however he is having increased drainage and worsening of the wound measurement wise I would go ahead and place him on an antibiotic today I do believe for this. 02/15/2019 on evaluation today patient actually appears to be doing somewhat better in regard to his ulcer. There is no signs of worsening at this time I did review his culture results which showed evidence of Staphylococcus aureus but not MRSA. Again  this could just be more related to the normal skin bacteria although he states the drainage has slowed down quite a bit he may have had a mild infection not just colonization. And was much smaller and then since around10/04/2019 on evaluation today patient appears to be doing unfortunately worse as far as the size of the wound. I really feel like that this is steadily getting larger again it had been doing excellent right at the beginning of September we have seen a steady increase in the area of the wound it is almost 2-1/2 times the size it was on September 1. Obviously this is a bad trend this is not wanting to see. For that reason we went back to using just the topical triamcinolone cream which does seem to help with inflammation. I checked him for bacteria by way of culture and nothing showed positive there. I am considering giving him a short course of a tapering steroid Dosepak today to see if that is can be beneficial for him. The patient is in agreement with giving that a try. 03/08/2019 on evaluation today patient appears to be doing very well in comparison to last evaluation with regard to his lower extremity ulcer. This is showing signs of less inflammation and actually measuring slightly smaller compared to last time every other week over the past month and a half he has been measuring larger larger larger. Nonetheless I do believe that the issue has been inflammation the prednisone does seem to [facility], Riki ([id number]) have been beneficial for him which is good news. No fevers, chills, nausea, vomiting, or diarrhea. 03/22/2019 on evaluation today patient appears to be doing about the same with regard to his leg ulcer. He has been tolerating the dressing changes without complication. With that being said the wound seems to be mostly arrested at its current size but really is not making any progress except for when we prescribed the prednisone. He did show some signs of  dropping as far as  the overall size of the wound during that interval week. Nonetheless this is something he is not on long-term at this point and unfortunately I think he is getting need either this or else the Humira which his dermatologist has discussed try to get approval for. With that being said he will be seeing his dermatologist on the 11th of this month that is November. 04/19/2019 on evaluation today patient appears to be doing really about the same the wound is measuring slightly larger compared to last time I saw him. He has not been into the office since November 2 due to the fact that he unfortunately had Covid as that his entire family. He tells me that it was rough but they did pull-through and he seems to be doing much better. Fortunately there is no signs of active infection at this time. No fevers, chills, nausea, vomiting, or diarrhea. 05/10/2019 on evaluation today patient unfortunately appears to be doing significantly worse as compared to last time I saw him. He does tell me that he has had his first dose of Humira and actually is scheduled to get the next one in the upcoming week. With that being said he tells me also that in the past several days he has been having a lot of issues with green drainage she showed me a picture this is more blue-green in color. He is also been having issues with increased sloughy buildup and the wound does appear to be larger today. Obviously this is not the direction that we want everything to take based on the starting of his Humira. Nonetheless I think this is definitely a result of likely infection and to be honest I think this is probably Pseudomonas causing the infection based on what I am seeing. 05/24/2019 on evaluation today patient unfortunately appears to be doing significantly worse compared to his prior evaluation with me 2 weeks ago. I did review his culture results which showed that he does have Staph aureus as well as Pseudomonas noted on the culture.  Nonetheless the Levaquin that I prescribed for him does not appear to have been appropriate and in fact he tells me he is no longer experiencing the green drainage and discharge that he had at the last visit. Fortunately there is no signs of active infection at this time which is good news although the wound has significantly worsened it in fact is much deeper than it was previous. We have been utilizing up to this point triamcinolone ointment as the prescription topical of choice but at this time I really feel like that the wound is getting need to be packed in order to appropriately manage this due to the deeper nature of the wound. Therefore something along the lines of an alginate dressing may be more appropriate. 05/31/2019 upon inspection today patient's wound actually showed signs of doing poorly at this point. Unfortunately he just does not seem to be making any good progress despite what we have tried. He actually did go ahead and pick up the Cipro and start taking that as he was noticing more green drainage he had previously completed the Levaquin that I prescribed for him as well. Nonetheless he missed his appointment for the seventh last week on Wednesday with the wound care center and [facility] where his dermatologist referred him. Obviously I do think a second opinion would be helpful at this point especially in light of the fact that the patient seems to be doing so  poorly despite the fact that we have tried everything that I really know how at this point. The only thing that ever seems to have helped him in the past is when he was on high doses of continual steroids that did seem to make a difference for him. 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 06/07/2019 upon evaluation today patient unfortunately appears to be doing  worse yet again with regard to his wound. In fact I am starting to question whether or not he may have a fluid pocket in the muscle at this point based on the bulging and the soft appearance to the central portion of the muscle area. There is not anything draining from the muscle itself at this time which is good news but nonetheless the wound is expanding. I am not really seeing any results of the Humira as far as overall wound progression based on what I am seeing at this point. The patient has been referred for second opinion with regard to his wound to the [facility] wound care center by his dermatologist which I definitely am not in opposition to. Unfortunately we tried multiple dressings in the past including collagen, alginate, and at one point even Hydrofera Blue. With that being said he is never really used it for any significant amount of time due to the fact that he often complains of pain associated with these dressings and then will go back to either using the Santyl which she has done intermittently or more frequently the triamcinolone. He is also using his own compression stockings. We have wrapped him in the past but again that was something else that he really was not a big fan of. 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 He seemed to do much better when he was on the steroids although honestly I understand that the steroids are not necessarily the best medication to be on long-term obviously 06/14/2019 on evaluation today patient appears to be doing actually a little bit better with regard to the overall appearance with his leg. Unfortunately he does continue to have  issues with what appears to be some fluid underneath the muscle although he did see the wound specialty center at [facility] last week their main goals were to see about infusion therapy in place of the Humira as they feel like that is not quite strong enough. They also recommended that we continue with the treatment otherwise as we are they felt like that was appropriate and they are okay with him continuing to follow-up here with [place] in that regard. With that being said they are also sending him to the vein specialist there to see about vein stripping and if that would be of benefit for him. Subsequently they also did not really address whether or not an ultrasound of the muscle area to see if there is anything that needs to be addressed here would be appropriate or not. For that reason I discussed this with him last  week I think we may proceed down that road at this point. 06/21/2019 upon evaluation today patient's wound actually appears to be doing slightly better compared to previous evaluations. I do believe that he has made a difference with regard to the progression here with the use of oral steroids. Again in the past has been the only thing that is really calm things down. He does tell me that from [facility] is gotten a good news from there that there are no further vein stripping that is necessary at this point. I do not have that available for review today although the patient did relay this to me. He also did obtain and have the ultrasound of the wound completed which I did sign off on today. It does appear that there is no fluid collection under the muscle this is likely then just edematous tissue in general. That is also good news. Overall I still believe the inflammation is the main issue here. He did inquire about the possibility of a wound VAC again with the muscle protruding like it is I am not really sure whether the wound VAC is necessarily ideal or not. That is something we will have to consider  although I do believe he may need compression wrapping to try to help with edema control which could potentially be of benefit. 06/28/2019 on evaluation today patient appears to be doing slightly better measurement wise although this is not terribly smaller he least seems to be trending towards that direction. With that being said he still seems to have purulent drainage noted in the wound bed at this time. He has been on Levaquin followed by Cipro over the past month. Unfortunately he still seems to have some issues with active infection at this time. I did perform a culture last week in order to evaluate and see if indeed there was still anything going on. Subsequently the culture did come back showing Pseudomonas which is consistent with the drainage has been having which is blue-green in color. He also has had an odor that again was somewhat consistent with Pseudomonas as well. Long story short it appears that the culture showed an intermediate finding with regard to how well the Cipro will work for the Pseudomonas infection. Subsequently being that he does not seem to be clearing up and at best what we are doing is just keeping this at [place] I think he may need to see infectious disease to discuss IV antibiotic options. ZALMEN, WRIGHTSMAN ([id number]) 07/05/2019 upon evaluation today patient appears to be doing okay in regard to his leg ulcer. He has been tolerating the dressing changes at this point without complication. Fortunately there is no signs of active infection at this time which is good news. No fevers, chills, nausea, vomiting, or diarrhea. With that being said he does have an appointment with infectious disease tomorrow and his primary care on Wednesday. Again the reason for the infectious disease referral was due to the fact that he did not seem to be fully resolving with the use of oral antibiotics and therefore we were thinking that IV antibiotic therapy may be necessary secondary to the  fact that there was an intermediate finding for how effective the Cipro may be. Nonetheless again he has been having a lot of purulent and even green drainage. Fortunately right now that seems to have calmed down over the past week with the reinitiation of the oral antibiotic. Nonetheless we will see what Dr. Megan Salon has to say. 07/12/2019 upon evaluation today  patient appears to be doing about the same at this point in regard to his left lower extremity ulcer. Fortunately there is no signs of active infection at this time which is good news I do believe the Levaquin has been beneficial I did review Dr. Hale Bogus note and to be honest I agree that the patient's leg does appear to be doing better currently. What we found in the past as he does not seem to really completely resolve he will stop the antibiotic and then subsequently things will revert back to having issues with blue-green drainage, increased pain, and overall worsening in general. Obviously that is the reason I sent him back to infectious disease. 07/19/2019 upon evaluation today patient appears to be doing roughly the same in size there is really no dramatic improvement. He has started back on the Levaquin at this point and though he seems to be doing okay he did still have a lot of blue/green drainage noted on evaluation today unfortunately. I think that this is still indicative more likely of a Pseudomonas infection as previously noted and again he does see Dr. Megan Salon in just a couple of days. I do not know that were really able to effectively clear this with just oral antibiotics alone based on what I am seeing currently. Nonetheless we are still continue to try to manage as best we can with regard to the patient and his wound. I do think the wrap was helpful in decreasing the edema which is excellent news. No fevers, chills, nausea, vomiting, or diarrhea. 07/26/2019 upon evaluation today patient appears to be doing slightly better with  regard to the overall appearance of the muscle there is no dark discoloration centrally. Fortunately there is no signs of active infection at this time. No fevers, chills, nausea, vomiting, or diarrhea. Patient's wound bed currently the patient did have an appointment with Dr. Megan Salon at infectious disease last week. With that being said Dr. Megan Salon the patient states was still somewhat hesitant about put him on any IV antibiotics he wanted [place] to repeat cultures today and then see where things go going forward. He does look like Dr. Megan Salon because of some improvement the patient did have with the Levaquin wanted [place] to see about repeating cultures. If it indeed grows the Pseudomonas again then he recommended a possibility of considering a PICC line placement and IV antibiotic therapy. He plans to see the patient back in 1 to 2 weeks. 08/02/2019 upon evaluation today patient appears to be doing poorly with regard to his left lower extremity. We did get the results of his culture back it shows that he is still showing evidence of Pseudomonas which is consistent with the purulent/blue-green drainage that he has currently. Subsequently the culture also shows that he now is showing resistance to the oral fluoroquinolones which is unfortunate as that was really the only thing to treat the infection prior. I do believe that he is looking like this is going require IV antibiotic therapy to get this under control. Fortunately there is no signs of systemic infection at this time which is good news. The patient does see Dr. Megan Salon tomorrow. 08/09/2019 upon evaluation today patient appears to be doing better with regard to his left lower extremity ulcer in regard to the overall appearance. He is currently on IV antibiotic therapy. As ordered by Dr. Megan Salon. Currently the patient is on ceftazidime which she is going to take for the next 2 weeks and then follow-up for 4 to 5-week  appointment with Dr. Megan Salon.  The patient started this this past Friday symptoms have not for a total of 3 days currently in full. 08/16/2019 upon evaluation today patient's wound actually does show muscle in the base of the wound but in general does appear to be much better as far as the overall evidence of infection is concerned. In fact I feel like this is for the most part cleared up he still on the IV antibiotics he has not completed the full course yet but I think he is doing much better which is excellent news. 08/23/2019 upon evaluation today patient appears to be doing about the same with regard to his wound at this point. He tells me that he still has pain unfortunately. Fortunately there is no evidence of systemic infection at this time which is great news. There is significant muscle protrusion. 09/13/19 upon evaluation today patient appears to be doing about the same in regard to his leg unfortunately. He still has a lot of drainage coming from the ulceration there is still muscle exposed. With that being said the patient's last wound culture still showed an intermediate finding with regard to the Pseudomonas he still having the bluish/green drainage as well. Overall I do not know that the wound has completely cleared of infection at this point. Fortunately there is no signs of active infection systemically at this point which is good news. 09/20/2019 upon evaluation today patient's wound actually appears to be doing about the same based on what I am seeing currently. I do not see any signs of systemic infection he still does have evidence of some local infection and drainage. He did see Dr. Megan Salon last week and Dr. Megan Salon states that he probably does need a different IV antibiotic although he does not want to put him on this until the patient begins the Remicade infusion which is actually scheduled for about 10 days out from today on 13 May. Following that time Dr. Megan Salon is good to see him back and then will  evaluate the feasibility of starting him on the IV antibiotic therapy once again at that point. I do not disagree with this plan I do believe as Dr. Megan Salon stated in his note that I reviewed today that the patient's issue is multifactorial with the pyoderma being 1 aspect of this that were hoping the Remicade will be helpful for her. In the meantime I think the gentamicin is, helping to keep things under decent okay control in regard to the ulcer. 09/27/2019 upon evaluation today patient appears to be doing about the same with regard to his wound still there is a lot of muscle exposure though he does have some hyper granulation tissue noted around the edge and actually some granulation tissue starting to form over the muscle which is actually good news. Fortunately there is no evidence of active infection which is also good news. His pain is less at this point. 5/21; this is a patient I have not seen in a long time. 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 He has a large wound on the left lateral leg with protruding muscle. He comes in the clinic today showing the same area on his left medial ankle. He says there is been a spot there for some time although we have not previously defined this. Today he has a clearly defined area with slight amount of skin breakdown surrounded by raised areas with a purplish hue in color. This is not painful he  says it is irritated. 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 He still has muscle protruding from the lateral portion of his left leg but fortunately the new area that they were concerned about at his last visit does not appear to have opened at this point. He is currently on Remicade infusions and seems to be doing better in my opinion in fact the wound itself seems to be overall much better. The purplish  discoloration that he did have seems to have resolved and I think that is a good sign that hopefully the Remicade is doing its job. He does have some biofilm noted over the surface of the wound. 11/01/2019 on evaluation today patient's wound actually appears to be doing excellent at this time. Fortunately there is no evidence of active infection and overall I feel like he is making great progress. The Remicade seems to be due excellent job in my opinion. TARIQUE, LOVEALL ([id number]) 11/08/19 evaluation today vision actually appears to be doing quite well with regard to his weight ulcer. He's been tolerating dressing changes without complication. Fortunately there is no evidence of infection. No fevers, chills, nausea, or vomiting noted at this time. Overall states that is having more itching than pain which is actually a good sign in my opinion. 12/13/2019 upon evaluation today patient appears to be doing well today with regard to his wound. He has been tolerating the dressing changes without complication. Fortunately there is no sign of active infection at this time. No fevers, chills, nausea, vomiting, or diarrhea. Overall I feel like the infusion therapy has been very beneficial for him. 01/06/2020 on evaluation today patient appears to be doing well with regard to his wound. This is measuring smaller and actually looks to be doing better. Fortunately there is no signs of active infection at this point. No fevers, chills, nausea, vomiting, or diarrhea. With that being said he does still have the blue-green drainage but this does not seem to be causing any significant issues currently. He has been using the gentamicin that does seem to be keeping things under decent control at this point. He goes later this morning for his next infusion therapy for the pyoderma which seems to also be very beneficial. 02/07/2020 on evaluation today patient appears to be doing about the same in regard to his wounds  currently. Fortunately there is no signs of active infection systemically he does still have evidence of local infection still using gentamicin. He also is showing some signs of improvement albeit slowly I do feel like we are making some progress here. 02/21/2020 upon evaluation today patient appears to be making some signs of improvement the wound is measuring a little bit smaller which is great news and overall I am very pleased with where he stands currently. He is going to be having infusion therapy treatment on the 15th of this month. Fortunately there is no signs of active infection at this time. 03/13/2020 I do believe patient's wound is actually showing some signs of improvement here which is great news. He has continue with the infusion therapy through rheumatology/dermatology at [facility]. That does seem to be beneficial. I still think he gets as much benefit from this as he did from the prednisone initially but nonetheless obviously this is less harsh on his body that the prednisone as far as they are concerned. 03/31/2020 on evaluation today patient's wound actually showing signs of some pretty  good improvement in regard to the overall appearance of the wound bed. There is still muscle exposed though he does have some epithelial growth around the edges of the wound. Fortunately there is no signs of active infection at this time. No fevers, chills, nausea, vomiting, or diarrhea. 04/24/2020 upon evaluation today patient appears to be doing about the same in regard to his leg ulcer. He has been tolerating the dressing changes without complication. Fortunately there is no signs of active infection at this time. No fevers, chills, nausea, vomiting, or diarrhea. With that being said he still has a lot of irritation from the bandaging around the edges of the wound. We did discuss today the possibility of a referral to plastic surgery. 05/22/2020 on evaluation today patient appears to be doing well with  regard to his wounds all things considered. He has not been able to get the Chantix apparently there is a recall nurse that I was unaware of put out by [employer] involuntarily. Nonetheless for now I am and I have to do some research into what may be the best option for him to help with quitting in regard to smoking and we discussed that today. 06/26/2020 upon evaluation today patient appears to be doing well with regard to his wound from the standpoint of infection I do not see any signs of infection at this point. With that being said unfortunately he is still continuing to have issues with muscle exposure and again he is not having a whole lot of new skin growth unfortunately. There does not appear to be any signs of active infection at this time. No fevers, chills, nausea, vomiting, or diarrhea. 07/10/2020 upon evaluation today patient appears to be doing a little bit more poorly currently compared to where he was previous. I am concerned currently about an active infection that may be getting worse especially in light of the increased size and tenderness of the wound bed. No fevers, chills, nausea, vomiting, or diarrhea. 07/24/2020 upon evaluation today patient appears to be doing poorly in regard to his leg ulcer. He has been tolerating the dressing changes without complication but unfortunately is having a lot of discomfort. 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 Subsequently I think he is going require possibly IV antibiotics to get this under control. I am very concerned about the severity of his infection and the amount of discomfort he is having. 07/31/2020 upon evaluation today patient appears to be doing about the same in regard to his leg wound. He did see Dr. Megan Salon and Dr. Megan Salon is actually going to start him on IV antibiotics. He goes for the PICC line tomorrow. With that being said there do not have that run for  2 weeks and then see how things are doing and depending on how he is progressing they may extend that a little longer. Nonetheless I am glad this is getting ready to be in place and definitely feel it may help the patient. In the meantime is been using mainly triamcinolone to the wound bed has an anti-inflammatory. 08/07/2020 on evaluation today patient appears to be doing well with regard to his wound compared even last week. In the interim he has gotten the PICC line placed and overall this seems to be doing excellent. There does not appear to be any evidence of infection which is great news systemically although locally of course has had the infection this appears to be improving with the use of  the antibiotics. 08/14/2020 upon evaluation today patient's wound actually showing signs of excellent improvement. Overall the irritation has significantly improved the drainage is back down to more of a normal level and his pain is really pretty much nonexistent compared to what it was. Obviously I think that this is significantly improved secondary to the IV antibiotic therapy which has made all the difference in the world. Again he had a resistant form of Pseudomonas for which oral antibiotics just was not cutting it. Nonetheless I do think that still we need to consider the possibility of a surgical closure for this wound is been open so long and to be honest with muscle exposed I think this can be very hard to get this to close outside of this although definitely were still working to try to do what we can in that regard. Electronic Signature(s) Signed: 08/14/2020 10:22:19 AM By: Worthy Keeler PA-C Entered By: Worthy Keeler on 08/14/2020 10:22:18 VICTORY, STROLLO ([id number]) -------------------------------------------------------------------------------- Physical Exam Details Patient Name: [name] Date of Service: 08/14/2020 8:15 AM Medical Record Number: [id number] Patient Account Number:  [id number] Date of Birth/Sex: Jun 18, 1980 (40 y.o. M) Treating RN: Carlene Coria Primary Care Provider: [name] Other Clinician: Jeanine Luz Referring Provider: [name] Treating Provider/Extender: [name] Cliche in Treatment: 49 Constitutional Well-nourished and well-hydrated in no acute distress. Respiratory normal breathing without difficulty. Psychiatric this patient is able to make decisions and demonstrates good insight into disease process. Alert and Oriented x 3. pleasant and cooperative. Notes Upon inspection patient's wound bed actually showed signs of good granulation epithelization at this point. He does have muscle exposed over the majority of the wound bed. With that being said I do believe that at all possible it would be good to consider surgical closure. For this reason I am going to see about making a referral to Dr. Claudia Desanctis in [place] to see what he would recommend or could potentially do for this patient. Dr. Redmond Baseman is excellent and I explained to the patient that he may have an option for him or may have some other things to recommend as well. Electronic Signature(s) Signed: 08/14/2020 10:23:00 AM By: Worthy Keeler PA-C Entered By: Worthy Keeler on 08/14/2020 10:22:59 JERI, RAWLINS ([id number]) -------------------------------------------------------------------------------- Physician Orders Details Patient Name: [name] Date of Service: 08/14/2020 8:15 AM Medical Record Number: [id number] Patient Account Number: [id number] Date of Birth/Sex: March 16, 1981 (39 y.o. 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 Edema Control - Lymphedema / Segmental Compressive Device / Other o Tubigrip double layer applied - F Wound Treatment Wound #1 - Lower Leg Wound Laterality: Left, Lateral Cleanser: Soap and Water 1 x Per Day/30 Days Discharge Instructions: Gently cleanse wound with antibacterial soap, rinse and pat dry prior to dressing wounds Topical: Triamcinolone Acetonide Cream, 0.1%, 15 (g) tube 1 x Per Day/30 Days Discharge Instructions: to wound bed , lightly Secondary Dressing: Mepilex Border Flex, 6x6 (in/in) 1 x Per Day/30 Days Discharge Instructions: Apply to wound as directed. Do not cut. Consults   o Plastic Surgery - Dr Claudia Desanctis surgical consult for wound closure - (ICD10 [phone] - Non-pressure chronic ulcer of left calf with fat layer exposed) Electronic Signature(s) Signed: 08/14/2020 5:08:01 PM By: Carlene Coria RN Signed: 08/14/2020 5:17:55 PM By: Worthy Keeler PA-C Entered By: Carlene Coria on 08/14/2020 10:22:26 KANNAN, PROIA ([id number]) -------------------------------------------------------------------------------- Problem List Details Patient Name: [name] Date of Service: 08/14/2020 8:15 AM Medical Record Number: [id number] Patient Account Number: [id number] Date of Birth/Sex: 05/14/1981 (40 y.o. M) Treating RN: Carlene Coria Primary Care Provider: [name] Other Clinician: Jeanine Luz Referring Provider: [name] Treating Provider/Extender: [name] Cliche in Treatment: [phone] Active Problems ICD-10 Encounter Code Description Active Date MDM Diagnosis L97.222 Non-pressure chronic ulcer of left calf with fat layer exposed 12/04/2016 No Yes L88  Pyoderma gangrenosum 03/26/2017 No Yes L97.321 Non-pressure chronic ulcer of left ankle limited to breakdown of skin 10/08/2019 No Yes I87.2 Venous insufficiency (chronic) (peripheral) 12/04/2016 No Yes L03.116 Cellulitis of left lower limb 05/24/2019 No Yes F17.208 Nicotine dependence, unspecified, with other nicotine-induced disorders 04/24/2020 No Yes Inactive Problems ICD-10 Code Description Active Date Inactive Date L97.213 Non-pressure chronic ulcer of right calf with necrosis of muscle 04/02/2017 04/02/2017 Resolved Problems Electronic Signature(s) Signed: 08/14/2020 8:45:57 AM By: Worthy Keeler PA-C Entered By: Worthy Keeler on 08/14/2020 08:45:57 Nouri, Gerber ([id number]) -------------------------------------------------------------------------------- Progress Note Details Patient Name: [name] Date of Service: 08/14/2020 8:15 AM Medical Record Number: [id number] Patient Account Number: [id number] Date of Birth/Sex: 1980-08-09 (39 y.o. M) Treating RN: Carlene Coria Primary Care Provider: [name] Other Clinician: Jeanine Luz Referring Provider: [name] Treating Provider/Extender: [name] Cliche in Treatment: 192 Subjective Chief Complaint Information obtained from Patient He is here in follow up evaluation for LLE pyoderma ulcer History of Present Illness (HPI) 12/04/16; 40 year old man who comes into the clinic today for review of a wound on the posterior left calf. He tells me that is been there for about a year. He is not a diabetic he does smoke half a pack per day. He was seen in the ER on 11/20/16 felt to have cellulitis around the wound and was given clindamycin. An x-ray did not show osteomyelitis. The patient initially tells me that he has a milk allergy that sets off a pruritic itching rash on his lower legs which she scratches incessantly and he thinks that's what may have set up the wound. He has been using various topical antibiotics and  ointments without any effect. He works in a [employer] and is on his feet all day. He does not have a prior history of wounds however he does have the rash on both lower legs the right arm and the ventral aspect of his left arm. These are excoriations and clearly have had scratching however there are of macular looking areas on both legs including a substantial larger area on the right leg. This does not have an underlying open area. There is no blistering. The patient tells me that 2 years ago in [place] in response to the rash on his legs he saw a dermatologist who told him he had a condition which may be pyoderma gangrenosum although I may be putting words into his mouth. He seemed to recognize this. 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 He is not in any treatment for this. He's had no recent travel 12/11/16; the patient arrives today with his wound and roughly the same condition we've been using silver alginate this is  a deep punched out wound with some surrounding erythema but no tenderness. Biopsy I did did not show confirmed pyoderma gangrenosum suggested nonspecific inflammation and vasculitis but does not provide an actual description of what was seen by the pathologist. I'm really not able to understand this We have also received information from the patient's dermatologist in [place] notes from April 2016. This was a doctor Agarwal-antal. 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 He was prescribed topical steroid high potency under occlusion which helped but at this point the patient did not have a deep punched out wound. 12/18/16; the patient's wound is larger in terms of surface area however this surface looks better and there is less depth. The surrounding erythema also is better. The patient states that the wrap we put on came off 2 days ago when he has been using his compression stockings. He we are in the process of getting a  dermatology consult. 12/26/16 on evaluation today patient's left lower extremity wound shows evidence of infection with surrounding erythema noted. He has been tolerating the dressing changes but states that he has noted more discomfort. There is a larger area of erythema surrounding the wound. No fevers, chills, nausea, or vomiting noted at this time. With that being said the wound still does have slough covering the surface. He is not allergic to any medication that he is aware of at this point. In regard to his right lower extremity he had several regions that are erythematous and pruritic he wonders if there's anything we can do to help that. 01/02/17 I reviewed patient's wound culture which was obtained his visit last week. He was placed on doxycycline at that point. Unfortunately that does not appear to be an antibiotic that would likely help with the situation however the pseudomonas noted on culture is sensitive to Cipro. Also unfortunately patient's wound seems to have a large compared to last week's evaluation. Not severely so but there are definitely increased measurements in general. He is continuing to have discomfort as well he writes this to be a seven out of 10. In fact he would prefer me not to perform any debridement today due to the fact that he is having discomfort and considering he has an active infection on the little reluctant to do so anyway. No fevers, chills, nausea, or vomiting noted at this time. 01/08/17; patient seems dermatology on September 5. I suspect dermatology will want the slides from the biopsy I did sent to their pathologist. I'm not sure if there is a way we can expedite that. In any case the culture I did before I left on vacation 3 weeks ago showed Pseudomonas he was given 10 days of Cipro and per her description of her intake nurses is actually somewhat better this week although the wound is quite a bit bigger than I remember the last time I saw this. He still  has 3 more days of Cipro 01/21/17; dermatology appointment tomorrow. He has completed the ciprofloxacin for Pseudomonas. Surface of the wound looks better however he is had some deterioration in the lesions on his right leg. Meantime the left lateral leg wound we will continue with sample 01/29/17; patient had his dermatology appointment but I can't yet see that note. He is completed his antibiotics. The wound is more superficial but considerably larger in circumferential area than when he came in. This is in his left lateral calf. He also has swollen erythematous areas with superficial wounds on the right leg and  small papular areas on both arms. There apparently areas in her his upper thighs and buttocks I did not look at those. Dermatology biopsied the right leg. Hopefully will have their input next week. 02/05/17; patient went back to see his dermatologist who told him that he had a "scratching problem" as well as staph. He is now on a 30 day course of doxycycline and I believe she gave him triamcinolone cream to the right leg areas to help with the itching [not exactly sure but probably triamcinolone]. She apparently looked at the left lateral leg wound although this was not rebiopsied and I think felt to be ultimately part of the same pathogenesis. He is using sample border foam and changing nevus himself. He now has a new open area on the right posterior leg which was his biopsy site I don't have any of the dermatology notes 02/12/17; we put the patient in compression last week with SANTYL to the wound on the left leg and the biopsy. Edema is much better and the depth of the wound is now at level of skin. Area is still the same Biopsy site on the right lateral leg we've also been using santyl with a border foam dressing and he is changing this himself. 02/19/17; Using silver alginate started last week to both the substantial left leg wound and the biopsy site on the right wound. He is  tolerating compression well. Has a an appointment with his primary M.D. tomorrow wondering about diuretics although I'm wondering if the edema problem is actually lymphedema OSWALDO, CUETO ([id number]) 02/26/17; the patient has been to see his primary doctor Dr. Jerrel Ivory at [facility] our primary care. She started him on Lasix 20 mg and this seems to have helped with the edema. However we are not making substantial change with the left lateral calf wound and inflammation. The biopsy site on the right leg also looks stable but not really all that different. 03/12/17; the patient has been to see vein and vascular Dr. Lucky Cowboy. He has had venous reflux studies I have not reviewed these. I did get a call from his dermatology office. 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 They therefore recommended clobetasol and tetracycline i.e. 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 03/26/17; apparently the patient just had reflux studies not an appointment with Dr. dew. She arrives in clinic today having applied clobetasol for 2-3 weeks. He notes over the last 2-3 days excessive drainage having to change the dressing 3-4 times a day and also expanding erythema. 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 Lillia Carmel at [facility] dermatology locally who kindly saw him at my request. 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 My  concern was the degree of erythema medially extending from his left leg wound which was either inflammation from pyoderma or cellulitis. I put him on Augmentin however culture of the wound showed Pseudomonas which is quinolone sensitive. I  really don't believe he has cellulitis however in view of everything I will continue and give him a course of Cipro. He is also on doxycycline as an immune modulator for the pyoderma. In addition to his original wound on the left lateral leg with surrounding erythema he has a wound on the right posterior calf which was an original biopsy site done by dermatology. 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 Saw Dr. Lillia Carmel yesterday. He has been using topical antibiotics to both wound areas his original wound on the left and the biopsies/pathergy area on the right. There is definitely some improvement in the inflammation around the wound on the right although the patient states he has increasing sensitivity of the wounds. He is on prednisone 60 and doxycycline 1 as prescribed by Dr. Lillia Carmel. He is covering the topical antibiotic with gauze and putting this in his own compression stocks and changing this daily. He states that Dr. Lottie Rater did a culture of the left leg wound yesterday 05/07/17; pyoderma gangrenosum. The patient saw Dr. Lillia Carmel yesterday and has a follow-up with her in one month. He is still using topical antibiotics to both wounds although he can't recall exactly what type. He is still on prednisone 60 mg. Dr. Lillia Carmel stated that the doxycycline could stop if we were in agreement. 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 The right medial leg was induced by biopsy/pathergy. The area on the right is essentially healed. 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 The right medial leg has closed and remains closed. He is still on prednisone 60. He tells me he missed his last dermatology appointment with Dr. Lillia Carmel but will make another appointment. He reports that her blood sugar at a recent screen in [place] was high 200's. He was 180 today. 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 I think he is going to require still much longer prednisone perhaps another 3 months before attempting to taper. In the meantime his wound is a lot better. Smaller. He is cleaning this off daily and applying topical antibiotics. When he was last in the clinic I thought about changing to [facility] and actually put in a couple of calls to dermatology although probably not during their business hours. 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 He continues on oral prednisone. He has been using triple antibiotic ointment. There is surface debris and we will transition to [facility] and have him return in 2 weeks. He has lost 30 pounds since his last appointment with lifestyle modification. He may benefit from topical steroid cream for treatment this can be considered at a later date. 08/22/17 on evaluation today patient appears to actually be doing rather well in regard to his left lateral lower extremity ulcer. He has actually been managed by Dr. Dellia Nims most recently. Patient is currently on oral steroids at this time. This seems to have been of benefit for him. Nonetheless his last visit was actually with Leah on 08/06/17. Currently he is not utilizing any topical steroid creams although this could be of benefit as well. No fevers, chills, nausea, or vomiting noted at this time. 09/05/17 on  evaluation today patient appears to be doing better in regard to his left lateral lower  extremity ulcer. He has been tolerating the dressing changes without complication. He is using Santyl with good effect. Overall I'm very pleased with how things are standing at this point. Patient likewise is happy that this is doing better. 09/19/17 on evaluation today patient actually appears to be doing rather well in regard to his left lateral lower extremity ulcer. 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 He's not having any significant pain. 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 He has been tolerating the Santyl without complication and in general I feel like he's making good progress. 10/17/17 on evaluation today patient appears to be doing very well in regard to his left lateral lower surety ulcer. He has been tolerating the dressing changes without complication. There does not appear to be any evidence of infection he's alternating the Santyl and the triple antibiotic ointment every other day this seems to be doing well for him. 11/03/17 on evaluation today patient appears to be doing very well in regard to his left lateral lower extremity ulcer. He is been tolerating the dressing changes without complication which is good news. Fortunately there does not appear to be any evidence of infection which is also great news. Overall is doing excellent they are starting to taper down on the prednisone is down to 40 mg at this point it also started topical clobetasol for him. 11/17/17 on evaluation today patient appears to be doing well in regard to his left lateral lower surety ulcer. He's been tolerating the dressing changes without complication. He does note that he is having no pain, no excessive drainage or discharge, and overall he feels like things are going about how he would expect and hope they would.  Overall he seems to have no evidence of infection at this time in my opinion which is good news. 12/04/17-He is seen in follow-up evaluation for right lateral lower extremity ulcer. He has been applying topical steroid cream. Today's measurement show slight increase in size. Over the next 2 weeks we will transition to every other day Santyl and steroid cream. He has been encouraged to monitor for changes and notify clinic with any concerns 12/15/17 on evaluation today patient's left lateral motion the ulcer and fortunately is doing worse again at this point. This just since last week to this week has close to doubled in size according to the patient. I did not seeing last week's I do not have a visual to compare this to in our system was also down so we do not have all the charts and at this point. Nonetheless it does have me somewhat concerned in regard to the fact that again he was worried enough about it he has contact the dermatology that placed them back on the full strength, 50 mg a day of the prednisone that he was taken previous. He continues to alternate using clobetasol along with Santyl at this point. He is obviously somewhat frustrated. ARIZONA, NORDQUIST ([id number]) 12/22/17 on evaluation today patient appears to be doing a little worse compared to last evaluation. Unfortunately the wound is a little deeper and slightly larger than the last week's evaluation. With that being said he has made some progress in regard to the irritation surrounding at this time  unfortunately despite that progress that's been made he still has a significant issue going on here. 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 For that reason I discussed with him today the possibility of perform a wound culture to ensure there's nothing overtly infected. 01/06/18 on evaluation  today patient's wound is larger and deeper than previously evaluated. With that being said it did appear that his wound was infected after my last evaluation with him. Subsequently I did end up prescribing a prescription for Bactrim DS which she has been taking and having no complication with. Fortunately there does not appear to be any evidence of infection at this point in time as far as anything spreading, no want to touch, and overall I feel like things are showing signs of improvement. 01/13/18 on evaluation today patient appears to be even a little larger and deeper than last time. There still muscle exposed in the base of the wound. Nonetheless he does appear to be less erythematous I do believe inflammation is calming down also believe the infection looks like it's probably resolved at this time based on what I'm seeing. No fevers, chills, nausea, or vomiting noted at this time. 01/30/18 on evaluation today patient actually appears to visually look better for the most part. Unfortunately those visually this looks better he does seem to potentially have what may be an abscess in the muscle that has been noted in the central portion of the wound. This is the first time that I have noted what appears to be fluctuance in the central portion of the muscle. With that being said I'm somewhat more concerned about the fact that this might indicate an abscess formation at this location. I do believe that an ultrasound would be appropriate. This is likely something we need to try to do as soon as possible. He has been switch to mupirocin ointment and he is no longer using the steroid ointment as prescribed by dermatology he sees them again next week he's been decreased from 60 to 40 mg of prednisone. 03/09/18 on evaluation today patient actually appears to be doing a little better compared to last time I saw him. There's not as much erythema surrounding the wound itself. He I did review his most recent  infectious disease note which was dated 02/24/18. He saw Dr. Michel Bickers in [place]. 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 Patient is now off of antibiotics and they are continually observing this. There seems to be no change in the past two weeks in my pinion based on what the patient says and what I see today compared to what Dr. Megan Salon likely saw two weeks ago. No fevers, chills, nausea, or vomiting noted at this time. 03/23/18 on evaluation today patient's wound actually appears to be showing signs of improvement which is good news. He is currently still on the Dapsone. He is also working on tapering the prednisone to get off of this and Dr. Lottie Rater is working with him in this regard. Nonetheless overall I feel like the wound is doing well it does appear based on the infectious disease note that I reviewed from Dr. 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 I did also review the results of his ultrasound of left lower extremity which revealed there was a dentist tissue in the base of the wound without an abscess noted. 04/06/18 on evaluation today the patient's left lateral lower extremity ulcer actually appears to be doing fairly well which is excellent news. There does not appear to be any evidence of infection at this time which is also great news. Overall he still does have a significantly large ulceration although little by little he seems to be making progress. He is down to 10 mg a day of the prednisone. 04/20/18 on evaluation today patient actually appears to be doing excellent at this time in regard to his left lower extremity ulcer. He's making signs of good progress unfortunately this is taking much longer than we would really like to see but nonetheless he is making  progress. Fortunately there does not appear to be any evidence of infection at this time. No fevers, chills, nausea, or vomiting noted at this time. The patient has not been using the Santyl due to the cost he hadn't got in this field yet. He's mainly been using the antibiotic ointment topically. Subsequently he also tells me that he really has not been scrubbing in the shower I think this would be helpful again as I told him it doesn't have to be anything too aggressive to even make it believe just enough to keep it free of some of the loose slough and biofilm on the wound surface. 05/11/18 on evaluation today patient's wound appears to be making slow but sure progress in regard to the left lateral lower extremity ulcer. He is been tolerating the dressing changes without complication. Fortunately there does not appear to be any evidence of infection at this time. He is still just using triple antibiotic ointment along with clobetasol occasionally over the area. He never got the Santyl and really does not seem to intend to in my pinion. 06/01/18 on evaluation today patient appears to be doing a little better in regard to his left lateral lower extremity ulcer. He states that overall he does not feel like he is doing as well with the Dapsone as he did with the prednisone. Nonetheless he sees his dermatologist later today and is gonna talk to them about the possibility of going back on the prednisone. Overall again I believe that the wound would be better if you would utilize Santyl but he really does not seem to be interested in going back to the [place] at this point. He has been using triple antibiotic ointment. 06/15/18 on evaluation today patient's wound actually appears to be doing about the same at this point. Fortunately there is no signs of infection at this time. He has made slight improvements although he continues to not really want to clean the wound bed at this point. He states that he just  doesn't mess with it he doesn't want to cause any problems with everything else he has going on. He has been on medication, antibiotics as prescribed by his dermatologist, for a staff infection of his lower extremities which is really drying out now and looking much better he tells me. Fortunately there is no sign of overall infection. 06/29/18 on evaluation today patient appears to be doing well in regard to his left lateral lower surety ulcer all things considering. Fortunately his staff infection seems to be greatly improved compared to previous. He has no signs of infection and this is drying up  quite nicely. He is still the doxycycline for this is no longer on cental, Dapsone, or any of the other medications. His dermatologist has recommended possibility of an infusion but right now he does not want to proceed with that. 07/13/18 on evaluation today patient appears to be doing about the same in regard to his left lateral lower surety ulcer. Fortunately there's no signs of infection at this time which is great news. Unfortunately he still builds up a significant amount of Slough/biofilm of the surface of the wound he still is not really cleaning this as he should be appropriately. Again I'm able to easily with saline and gauze remove the majority of this on the surface which if you would do this at home would likely be a dramatic improvement for him as far as getting the area to improve. Nonetheless overall I still feel like he is making progress is just very slow. I think Santyl will be of benefit for him as well. Still he has not gotten this as of this point. 07/27/18 on evaluation today patient actually appears to be doing little worse in regards of the erythema around the periwound region of the wound he also tells me that he's been having more drainage currently compared to what he was experiencing last time I saw him. He states not quite as bad as what he had because this was infected previously  but nonetheless is still appears to be doing poorly. Fortunately there is no evidence of systemic infection at this point. The patient tells me that he is not going to be able to afford the Santyl. He is still waiting to hear about the infusion therapy with his dermatologist. Apparently she wants an updated colonoscopy first. EDWYN, INCLAN ([id number]) 08/10/18 on evaluation today patient appears to be doing better in regard to his left lateral lower extremity ulcer. Fortunately he is showing signs of improvement in this regard he's actually been approved for Remicade infusion's as well although this has not been scheduled as of yet. Fortunately there's no signs of active infection at this time in regard to the wound although he is having some issues with infection of the right lower extremity is been seen as dermatologist for this. Fortunately they are definitely still working with him trying to keep things under control. 09/07/18 on evaluation today patient is actually doing rather well in regard to his left lateral lower extremity ulcer. He notes these actually having some hair grow back on his extremity which is something he has not seen in years. He also tells me that the pain is really not giving them any trouble at this time which is also good news overall she is very pleased with the progress he's using a combination of the mupirocin along with the probate is all mixed. 09/21/18 on evaluation today patient actually appears to be doing fairly well all things considered in regard to his looks from the ulcer. He's been tolerating the dressing changes without complication. Fortunately there's no signs of active infection at this time which is good news he is still on all antibiotics or prevention of the staff infection. He has been on prednisone for time although he states it is gonna contact his dermatologist and see if she put them on a short course due to some irritation that he has going on  currently. Fortunately there's no evidence of any overall worsening this is going very slow I think cental would be something that would be helpful for him although he  states that $50 for tube is quite expensive. He therefore is not willing to get that at this point. 10/06/18 on evaluation today patient actually appears to be doing decently well in regard to his left lateral leg ulcer. He's been tolerating the dressing changes without complication. Fortunately there's no signs of active infection at this time. Overall I'm actually rather pleased with the progress he's making although it's slow he doesn't show any signs of infection and he does seem to be making some improvement. I do believe that he may need a switch up and dressings to try to help this to heal more appropriately and quickly. 10/19/18 on evaluation today patient actually appears to be doing better in regard to his left lateral lower extremity ulcer. This is shown signs of having much less Slough buildup at this point due to the fact he has been using the [employer]. Obviously this is very good news. The overall size of the wound is not dramatically smaller but again the appearance is. 11/02/18 on evaluation today patient actually appears to be doing quite well in regard to his lower [place] ulcer. A lot of the skin around the ulcer is actually somewhat irritating at this point this seems to be more due to the dressing causing irritation from the adhesive that anything else. Fortunately there is no signs of active infection at this time. 11/24/18 on evaluation today patient appears to be doing a little worse in regard to his overall appearance of his lower extremity ulcer. There's more erythema and warmth around the wound unfortunately. He is currently on doxycycline which he has been on for some time. With that being said I'm not sure that seems to be helping with what appears to possibly be an acute cellulitis with regard to his left lower  extremity ulcer. No fevers, chills, nausea, or vomiting noted at this time. 12/08/18 on evaluation today patient's wounds actually appears to be doing significantly better compared to his last evaluation. He has been using Santyl along with alternating tripling about appointment as well as the steroid cream seems to be doing quite well and the wound is showing signs of improvement which is excellent news. Fortunately there's no evidence of infection and in fact his culture came back negative with only normal skin flora noted. 12/21/2018 upon evaluation today patient actually appears to be doing excellent with regard to his ulcer. This is actually the best that I have seen it since have been helping to take care of him. It is both smaller as well as less slough noted on the surface of the wound and seems to be showing signs of good improvement with new skin growing from the edges. He has been using just the triamcinolone he does wonder if he can get a refill of that ointment today. 01/04/2019 upon evaluation today patient actually appears to be doing well with regard to his left lateral lower extremity ulcer. With that being said it does not appear to be that he is doing quite as well as last time as far as progression is concerned. There does not appear to be any signs of infection or significant irritation which is good news. With that being said I do believe that he may benefit from switching to a collagen based dressing based on how clean The wound appears. 01/18/2019 on evaluation today patient actually appears to be doing well with regard to his wound on the left lower extremity. He is not made a lot of progress compared to where  we were previous but nonetheless does seem to be doing okay at this time which is good news. There is no signs of active infection which is also good news. My only concern currently is I do wish we can get him into utilizing the collagen dressing his insurance would not pay  for the supplies that we ordered although it appears that he may be able to order this through his supply company that he typically utilizes. This is [employer]. Nonetheless he did try to order it during the office visit today and it appears this did go through. We will see if he can get that it is a different brand but nonetheless he has collagen and I do think will be beneficial. 02/01/2019 on evaluation today patient actually appears to be doing a little worse today in regard to the overall size of his wounds. Fortunately there is no signs of active infection at this time. That is visually. Nonetheless when this is happened before it was due to infection. For that reason were somewhat concerned about that this time as well. 02/08/2019 on evaluation today patient unfortunately appears to be doing slightly worse with regard to his wound upon evaluation today. Is measuring a little deeper and a little larger unfortunately. I am not really sure exactly what is causing this to enlarge he actually did see his dermatologist she is going to see about initiating Humira for him. Subsequently she also did do steroid injections into the wound itself in the periphery. Nonetheless still nonetheless he seems to be getting a little bit larger he is gone back to just using the steroid cream topically which I think is appropriate. I would say hold off on the collagen for the time being is definitely a good thing to do. Based on the culture results which we finally did get the final result back regarding it shows staph as the bacteria noted again that can be a normal skin bacteria based on the fact however he is having increased drainage and worsening of the wound measurement wise I would go ahead and place him on an antibiotic today I do believe for this. 02/15/2019 on evaluation today patient actually appears to be doing somewhat better in regard to his ulcer. There is no signs of worsening at this time I did review his  culture results which showed evidence of Staphylococcus aureus but not MRSA. Again this could just be more related to the normal skin bacteria although he states the drainage has slowed down quite a bit he may have had a mild infection not just colonization. And was much smaller and then since around10/04/2019 on evaluation today patient appears to be doing unfortunately worse as far as the size of the wound. I really feel like that this is steadily getting larger again it had been doing excellent right at the beginning of September we have seen a steady increase in the area of the wound it is almost 2-1/2 times the size it was on September 1. Obviously this is a bad trend this is not wanting to see. For that reason we went back to using just the topical triamcinolone cream which does seem to help with inflammation. I checked him for bacteria by way of culture and nothing showed positive there. I am considering giving him a short course of a tapering steroid Joaquim Tolen, Inocente ([id number]) today to see if that is can be beneficial for him. The patient is in agreement with giving that a try. 03/08/2019 on evaluation  today patient appears to be doing very well in comparison to last evaluation with regard to his lower extremity ulcer. This is showing signs of less inflammation and actually measuring slightly smaller compared to last time every other week over the past month and a half he has been measuring larger larger larger. Nonetheless I do believe that the issue has been inflammation the prednisone does seem to have been beneficial for him which is good news. No fevers, chills, nausea, vomiting, or diarrhea. 03/22/2019 on evaluation today patient appears to be doing about the same with regard to his leg ulcer. He has been tolerating the dressing changes without complication. With that being said the wound seems to be mostly arrested at its current size but really is not making any progress except  for when we prescribed the prednisone. He did show some signs of dropping as far as the overall size of the wound during that interval week. Nonetheless this is something he is not on long-term at this point and unfortunately I think he is getting need either this or else the Humira which his dermatologist has discussed try to get approval for. With that being said he will be seeing his dermatologist on the 11th of this month that is November. 04/19/2019 on evaluation today patient appears to be doing really about the same the wound is measuring slightly larger compared to last time I saw him. He has not been into the office since November 2 due to the fact that he unfortunately had Covid as that his entire family. He tells me that it was rough but they did pull-through and he seems to be doing much better. Fortunately there is no signs of active infection at this time. No fevers, chills, nausea, vomiting, or diarrhea. 05/10/2019 on evaluation today patient unfortunately appears to be doing significantly worse as compared to last time I saw him. He does tell me that he has had his first dose of Humira and actually is scheduled to get the next one in the upcoming week. With that being said he tells me also that in the past several days he has been having a lot of issues with green drainage she showed me a picture this is more blue-green in color. He is also been having issues with increased sloughy buildup and the wound does appear to be larger today. Obviously this is not the direction that we want everything to take based on the starting of his Humira. Nonetheless I think this is definitely a result of likely infection and to be honest I think this is probably Pseudomonas causing the infection based on what I am seeing. 05/24/2019 on evaluation today patient unfortunately appears to be doing significantly worse compared to his prior evaluation with me 2 weeks ago. I did review his culture results which  showed that he does have Staph aureus as well as Pseudomonas noted on the culture. Nonetheless the Levaquin that I prescribed for him does not appear to have been appropriate and in fact he tells me he is no longer experiencing the green drainage and discharge that he had at the last visit. Fortunately there is no signs of active infection at this time which is good news although the wound has significantly worsened it in fact is much deeper than it was previous. We have been utilizing up to this point triamcinolone ointment as the prescription topical of choice but at this time I really feel like that the wound is getting need to be packed  in order to appropriately manage this due to the deeper nature of the wound. Therefore something along the lines of an alginate dressing may be more appropriate. 05/31/2019 upon inspection today patient's wound actually showed signs of doing poorly at this point. Unfortunately he just does not seem to be making any good progress despite what we have tried. He actually did go ahead and pick up the Cipro and start taking that as he was noticing more green drainage he had previously completed the Levaquin that I prescribed for him as well. Nonetheless he missed his appointment for the seventh last week on Wednesday with the wound care center and [facility] where his dermatologist referred him. Obviously I do think a second opinion would be helpful at this point especially in light of the fact that the patient seems to be doing so poorly despite the fact that we have tried everything that I really know how at this point. The only thing that ever seems to have helped him in the past is when he was on high doses of continual steroids that did seem to make a difference for him. Right now he is on immune modulating medication to try to help with the pyoderma but I am not sure that he is getting as much relief at this point as he is previously obtained from the use of  steroids. 06/07/2019 upon evaluation today patient unfortunately appears to be doing worse yet again with regard to his wound. In fact I am starting to question whether or not he may have a fluid pocket in the muscle at this point based on the bulging and the soft appearance to the central portion of the muscle area. There is not anything draining from the muscle itself at this time which is good news but nonetheless the wound is expanding. I am not really seeing any results of the Humira as far as overall wound progression based on what I am seeing at this point. The patient has been referred for second opinion with regard to his wound to the [facility] wound care center by his dermatologist which I definitely am not in opposition to. Unfortunately we tried multiple dressings in the past including collagen, alginate, and at one point even Hydrofera Blue. With that being said he is never really used it for any significant amount of time due to the fact that he often complains of pain associated with these dressings and then will go back to either using the Santyl which she has done intermittently or more frequently the triamcinolone. He is also using his own compression stockings. We have wrapped him in the past but again that was something else that he really was not a big fan of. 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 He seemed to do much better when he was on the steroids although honestly I understand that the steroids are not necessarily the best medication to be on long-term obviously 06/14/2019 on evaluation today patient appears to be doing actually a little bit better with  regard to the overall appearance with his leg. Unfortunately he does continue to have issues with what appears to  be some fluid underneath the muscle although he did see the wound specialty center at [facility] last week their main goals were to see about infusion therapy in place of the Humira as they feel like that is not quite strong enough. They also recommended that we continue with the treatment otherwise as we are they felt like that was appropriate and they are okay with him continuing to follow-up here with [place] in that regard. With that being said they are also sending him to the vein specialist there to see about vein stripping and if that would be of benefit for him. Subsequently they also did not really address whether or not an ultrasound of the muscle area to see if there is anything that needs to be addressed here would be appropriate or not. For that reason I discussed this with him last week I think we may proceed down that road at this point. 06/21/2019 upon evaluation today patient's wound actually appears to be doing slightly better compared to previous evaluations. I do believe that he has made a difference with regard to the progression here with the use of oral steroids. Again in the past has been the only thing that is really calm things down. He does tell me that from [facility] is gotten a good news from there that there are no further vein stripping that is necessary at this point. I do not have that available for review today although the patient did relay this to me. He also did obtain and have the ultrasound of the wound completed which I did sign off on today. It does appear that there is no fluid collection under the muscle this is likely then just edematous tissue in general. That is also good news. Overall I still believe the inflammation is the main issue here. He did inquire about the possibility of a wound VAC again with the muscle protruding like it is I am not really sure whether  the wound VAC is necessarily ideal or not. That is something we will have to consider although I do believe he may need compression wrapping to try to help with edema control which could potentially be of benefit. 06/28/2019 on evaluation today patient appears to be doing slightly better measurement wise although this is not terribly smaller he least seems to be trending towards that direction. With that being said he still seems to have purulent drainage noted in the wound bed at this time. He has been on Levaquin followed by Cipro over the past month. Unfortunately he still seems to have some issues with active infection at this time. I did perform a culture last week in order to evaluate and see if indeed there was still anything going on. Subsequently the culture did come back Lemler, Amore ([id number]) showing Pseudomonas which is consistent with the drainage has been having which is blue-green in color. He also has had an odor that again was somewhat consistent with Pseudomonas as well. Long story short it appears that the culture showed an intermediate finding with regard to how well the Cipro will work for the Pseudomonas infection. Subsequently being that he does not seem to be clearing up and at best what we are doing is just keeping this at [place] I think he may need to see infectious disease to discuss IV antibiotic options. 07/05/2019 upon evaluation today patient appears to be doing okay in regard to his leg ulcer. He has been tolerating the dressing changes at this point without complication. Fortunately  there is no signs of active infection at this time which is good news. No fevers, chills, nausea, vomiting, or diarrhea. With that being said he does have an appointment with infectious disease tomorrow and his primary care on Wednesday. Again the reason for the infectious disease referral was due to the fact that he did not seem to be fully resolving with the use of oral antibiotics  and therefore we were thinking that IV antibiotic therapy may be necessary secondary to the fact that there was an intermediate finding for how effective the Cipro may be. Nonetheless again he has been having a lot of purulent and even green drainage. Fortunately right now that seems to have calmed down over the past week with the reinitiation of the oral antibiotic. Nonetheless we will see what Dr. Megan Salon has to say. 07/12/2019 upon evaluation today patient appears to be doing about the same at this point in regard to his left lower extremity ulcer. Fortunately there is no signs of active infection at this time which is good news I do believe the Levaquin has been beneficial I did review Dr. Hale Bogus note and to be honest I agree that the patient's leg does appear to be doing better currently. What we found in the past as he does not seem to really completely resolve he will stop the antibiotic and then subsequently things will revert back to having issues with blue-green drainage, increased pain, and overall worsening in general. Obviously that is the reason I sent him back to infectious disease. 07/19/2019 upon evaluation today patient appears to be doing roughly the same in size there is really no dramatic improvement. He has started back on the Levaquin at this point and though he seems to be doing okay he did still have a lot of blue/green drainage noted on evaluation today unfortunately. I think that this is still indicative more likely of a Pseudomonas infection as previously noted and again he does see Dr. Megan Salon in just a couple of days. I do not know that were really able to effectively clear this with just oral antibiotics alone based on what I am seeing currently. Nonetheless we are still continue to try to manage as best we can with regard to the patient and his wound. I do think the wrap was helpful in decreasing the edema which is excellent news. No fevers, chills, nausea, vomiting,  or diarrhea. 07/26/2019 upon evaluation today patient appears to be doing slightly better with regard to the overall appearance of the muscle there is no dark discoloration centrally. Fortunately there is no signs of active infection at this time. No fevers, chills, nausea, vomiting, or diarrhea. Patient's wound bed currently the patient did have an appointment with Dr. Megan Salon at infectious disease last week. With that being said Dr. Megan Salon the patient states was still somewhat hesitant about put him on any IV antibiotics he wanted [place] to repeat cultures today and then see where things go going forward. He does look like Dr. Megan Salon because of some improvement the patient did have with the Levaquin wanted [place] to see about repeating cultures. If it indeed grows the Pseudomonas again then he recommended a possibility of considering a PICC line placement and IV antibiotic therapy. He plans to see the patient back in 1 to 2 weeks. 08/02/2019 upon evaluation today patient appears to be doing poorly with regard to his left lower extremity. We did get the results of his culture back it shows that he is still showing  evidence of Pseudomonas which is consistent with the purulent/blue-green drainage that he has currently. Subsequently the culture also shows that he now is showing resistance to the oral fluoroquinolones which is unfortunate as that was really the only thing to treat the infection prior. I do believe that he is looking like this is going require IV antibiotic therapy to get this under control. Fortunately there is no signs of systemic infection at this time which is good news. The patient does see Dr. Megan Salon tomorrow. 08/09/2019 upon evaluation today patient appears to be doing better with regard to his left lower extremity ulcer in regard to the overall appearance. He is currently on IV antibiotic therapy. As ordered by Dr. Megan Salon. Currently the patient is on ceftazidime which she is going  to take for the next 2 weeks and then follow-up for 4 to 5-week appointment with Dr. Megan Salon. The patient started this this past Friday symptoms have not for a total of 3 days currently in full. 08/16/2019 upon evaluation today patient's wound actually does show muscle in the base of the wound but in general does appear to be much better as far as the overall evidence of infection is concerned. In fact I feel like this is for the most part cleared up he still on the IV antibiotics he has not completed the full course yet but I think he is doing much better which is excellent news. 08/23/2019 upon evaluation today patient appears to be doing about the same with regard to his wound at this point. He tells me that he still has pain unfortunately. Fortunately there is no evidence of systemic infection at this time which is great news. There is significant muscle protrusion. 09/13/19 upon evaluation today patient appears to be doing about the same in regard to his leg unfortunately. He still has a lot of drainage coming from the ulceration there is still muscle exposed. With that being said the patient's last wound culture still showed an intermediate finding with regard to the Pseudomonas he still having the bluish/green drainage as well. Overall I do not know that the wound has completely cleared of infection at this point. Fortunately there is no signs of active infection systemically at this point which is good news. 09/20/2019 upon evaluation today patient's wound actually appears to be doing about the same based on what I am seeing currently. I do not see any signs of systemic infection he still does have evidence of some local infection and drainage. He did see Dr. Megan Salon last week and Dr. Megan Salon states that he probably does need a different IV antibiotic although he does not want to put him on this until the patient begins the Remicade infusion which is actually scheduled for about 10 days out from  today on 13 May. Following that time Dr. Megan Salon is good to see him back and then will evaluate the feasibility of starting him on the IV antibiotic therapy once again at that point. I do not disagree with this plan I do believe as Dr. Megan Salon stated in his note that I reviewed today that the patient's issue is multifactorial with the pyoderma being 1 aspect of this that were hoping the Remicade will be helpful for her. In the meantime I think the gentamicin is, helping to keep things under decent okay control in regard to the ulcer. 09/27/2019 upon evaluation today patient appears to be doing about the same with regard to his wound still there is a lot of muscle exposure  though he does have some hyper granulation tissue noted around the edge and actually some granulation tissue starting to form over the muscle which is actually good news. Fortunately there is no evidence of active infection which is also good news. His pain is less at this point. 5/21; this is a patient I have not seen in a long time. 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 He has a large wound on the left lateral leg with protruding muscle. He comes in the clinic today showing the same area on his left medial ankle. He says there is been a spot there for some time although we have not previously defined this. Today he has a clearly defined area with slight amount of skin breakdown surrounded by raised areas with a purplish hue in color. This is not painful he says it is irritated. 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 He still has muscle protruding from the lateral portion of his left leg but fortunately the new area that they were concerned about at his last visit does not appear to have opened at this point. He is currently on Remicade infusions and seems to be doing better in  my opinion in fact the wound itself seems to be overall much better. The purplish discoloration that he did have seems to have resolved and I think that is a good sign that hopefully the Remicade is doing its job. He does Lobban, Bryce ([id number]) have some biofilm noted over the surface of the wound. 11/01/2019 on evaluation today patient's wound actually appears to be doing excellent at this time. Fortunately there is no evidence of active infection and overall I feel like he is making great progress. The Remicade seems to be due excellent job in my opinion. 11/08/19 evaluation today vision actually appears to be doing quite well with regard to his weight ulcer. He's been tolerating dressing changes without complication. Fortunately there is no evidence of infection. No fevers, chills, nausea, or vomiting noted at this time. Overall states that is having more itching than pain which is actually a good sign in my opinion. 12/13/2019 upon evaluation today patient appears to be doing well today with regard to his wound. He has been tolerating the dressing changes without complication. Fortunately there is no sign of active infection at this time. No fevers, chills, nausea, vomiting, or diarrhea. Overall I feel like the infusion therapy has been very beneficial for him. 01/06/2020 on evaluation today patient appears to be doing well with regard to his wound. This is measuring smaller and actually looks to be doing better. Fortunately there is no signs of active infection at this point. No fevers, chills, nausea, vomiting, or diarrhea. With that being said he does still have the blue-green drainage but this does not seem to be causing any significant issues currently. He has been using the gentamicin that does seem to be keeping things under decent control at this point. He goes later this morning for his next infusion therapy for the pyoderma which seems to also be very beneficial. 02/07/2020 on evaluation  today patient appears to be doing about the same in regard to his wounds currently. Fortunately there is no signs of active infection systemically he does still have evidence of local infection still using gentamicin. He also is showing some signs of improvement albeit slowly I do feel like we are making some progress  here. 02/21/2020 upon evaluation today patient appears to be making some signs of improvement the wound is measuring a little bit smaller which is great news and overall I am very pleased with where he stands currently. He is going to be having infusion therapy treatment on the 15th of this month. Fortunately there is no signs of active infection at this time. 03/13/2020 I do believe patient's wound is actually showing some signs of improvement here which is great news. He has continue with the infusion therapy through rheumatology/dermatology at [facility]. That does seem to be beneficial. I still think he gets as much benefit from this as he did from the prednisone initially but nonetheless obviously this is less harsh on his body that the prednisone as far as they are concerned. 03/31/2020 on evaluation today patient's wound actually showing signs of some pretty good improvement in regard to the overall appearance of the wound bed. There is still muscle exposed though he does have some epithelial growth around the edges of the wound. Fortunately there is no signs of active infection at this time. No fevers, chills, nausea, vomiting, or diarrhea. 04/24/2020 upon evaluation today patient appears to be doing about the same in regard to his leg ulcer. He has been tolerating the dressing changes without complication. Fortunately there is no signs of active infection at this time. No fevers, chills, nausea, vomiting, or diarrhea. With that being said he still has a lot of irritation from the bandaging around the edges of the wound. We did discuss today the possibility of a referral to plastic  surgery. 05/22/2020 on evaluation today patient appears to be doing well with regard to his wounds all things considered. He has not been able to get the Chantix apparently there is a recall nurse that I was unaware of put out by [employer] involuntarily. Nonetheless for now I am and I have to do some research into what may be the best option for him to help with quitting in regard to smoking and we discussed that today. 06/26/2020 upon evaluation today patient appears to be doing well with regard to his wound from the standpoint of infection I do not see any signs of infection at this point. With that being said unfortunately he is still continuing to have issues with muscle exposure and again he is not having a whole lot of new skin growth unfortunately. There does not appear to be any signs of active infection at this time. No fevers, chills, nausea, vomiting, or diarrhea. 07/10/2020 upon evaluation today patient appears to be doing a little bit more poorly currently compared to where he was previous. I am concerned currently about an active infection that may be getting worse especially in light of the increased size and tenderness of the wound bed. No fevers, chills, nausea, vomiting, or diarrhea. 07/24/2020 upon evaluation today patient appears to be doing poorly in regard to his leg ulcer. He has been tolerating the dressing changes without complication but unfortunately is having a lot of discomfort. 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 Subsequently I think he is going require possibly IV antibiotics to get this under control. I am very concerned about the severity of his infection and the amount of discomfort he is having. 07/31/2020 upon evaluation today patient appears to be doing about the same in regard to his leg wound. He did see Dr. Megan Salon and Dr. Megan Salon is actually going to start him on IV antibiotics.  He goes for  the PICC line tomorrow. With that being said there do not have that run for 2 weeks and then see how things are doing and depending on how he is progressing they may extend that a little longer. Nonetheless I am glad this is getting ready to be in place and definitely feel it may help the patient. In the meantime is been using mainly triamcinolone to the wound bed has an anti-inflammatory. 08/07/2020 on evaluation today patient appears to be doing well with regard to his wound compared even last week. In the interim he has gotten the PICC line placed and overall this seems to be doing excellent. There does not appear to be any evidence of infection which is great news systemically although locally of course has had the infection this appears to be improving with the use of the antibiotics. 08/14/2020 upon evaluation today patient's wound actually showing signs of excellent improvement. Overall the irritation has significantly improved the drainage is back down to more of a normal level and his pain is really pretty much nonexistent compared to what it was. Obviously I think that this is significantly improved secondary to the IV antibiotic therapy which has made all the difference in the world. Again he had a resistant form of Pseudomonas for which oral antibiotics just was not cutting it. Nonetheless I do think that still we need to consider the possibility of a surgical closure for this wound is been open so long and to be honest with muscle exposed I think this can be very hard to get this to close outside of this although definitely were still working to try to do what we can in that regard. NORVAL, SLAVEN ([id number]) Objective Constitutional Well-nourished and well-hydrated in no acute distress. Vitals Time Taken: 8:10 AM, Height: 71 in, Weight: 338 lbs, BMI: 47.1, Temperature: 98.3 F, Pulse: 77 bpm, Respiratory Rate: 18 breaths/min, Blood Pressure: 143/79 mmHg. Respiratory normal breathing  without difficulty. Psychiatric this patient is able to make decisions and demonstrates good insight into disease process. Alert and Oriented x 3. pleasant and cooperative. General Notes: Upon inspection patient's wound bed actually showed signs of good granulation epithelization at this point. He does have muscle exposed over the majority of the wound bed. With that being said I do believe that at all possible it would be good to consider surgical closure. For this reason I am going to see about making a referral to Dr. Claudia Desanctis in [place] to see what he would recommend or could potentially do for this patient. Dr. Redmond Baseman is excellent and I explained to the patient that he may have an option for him or may have some other things to recommend as well. Integumentary (Hair, Skin) Wound #1 status is Open. Original cause of wound was Gradually Appeared. The date acquired was: 11/18/2015. The wound has been in treatment 192 weeks. The wound is located on the Left,Lateral Lower Leg. The wound measures 6.6cm length x 8cm width x 0.8cm depth; 41.469cm^2 area and 33.175cm^3 volume. There is muscle and Fat Layer (Subcutaneous Tissue) exposed. There is no tunneling or undermining noted. There is a small amount of purulent drainage noted. The wound margin is epibole. There is medium (34-66%) red, pink granulation within the wound bed. There is a medium (34-66%) amount of necrotic tissue within the wound bed including Adherent Slough. 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 Edema Control - Lymphedema / Segmental Compressive Device / Other: Tubigrip double  layer applied - F Consults ordered were: Plastic Surgery - Dr Claudia Desanctis surgical consult for wound closure WOUND #1: - Lower Leg Wound Laterality: Left, Lateral Cleanser: Soap and Water 1 x Per Day/30 Days Discharge Instructions: Gently cleanse wound with antibacterial soap, rinse and pat dry prior to dressing wounds Topical: Triamcinolone Acetonide Cream, 0.1%, 15 (g) tube 1 x Per Day/30 Days Discharge Instructions: to wound bed , lightly Secondary Dressing: Mepilex Border Flex, 6x6 (in/in) 1 x Per Day/30 Days Discharge Instructions: Apply to wound as directed. Do not cut. 1. Would recommend currently that we going continue with the wound care measures as before utilizing the triamcinolone to the wound bed to help with inflammation secondary to the pyoderma. 2. I am also can recommend the patient continue with IV antibiotic therapy does see Dr. Megan Salon I believe tomorrow. 3. I am also can recommend the patient continue to elevate his leg and use his compression stocking for the time being. Prichett, Osa ([id number]) 4. He does appear that we may have gotten the Apligraf approved but to be honest not really certain that this is the absolute best thing for him or if we should wait and see with Dr. Claudia Desanctis as it is a first. Depending on the evaluation with Dr. Claudia Desanctis we can always consider the Apligraf following. We will see what the patient feels like he would prefer to do. We will see patient back for reevaluation in 1 week here in the clinic. If anything worsens or changes patient will contact our office for additional recommendations. Electronic Signature(s) Signed: 08/14/2020 10:23:50 AM By: Worthy Keeler PA-C Entered By: Worthy Keeler on 08/14/2020 10:23:50 JOSAIAH, MUHAMMED ([id number]) -------------------------------------------------------------------------------- SuperBill Details Patient Name: [name] Date of Service: 08/14/2020 Medical Record Number: [id number] Patient Account Number:  [id number] Date of Birth/Sex: 12-May-1981 (40 y.o. 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 of skin I87.2 Venous insufficiency (chronic) (peripheral) L03.116 Cellulitis of left lower limb F17.208 Nicotine dependence, unspecified, with other nicotine-induced disorders Facility Procedures CPT4 Code: 17711657 Description: 99213 - WOUND CARE VISIT-LEV 3 EST PT Modifier: Quantity: 1 Physician Procedures CPT4 Code: 9038333 Description: 83291 - WC PHYS LEVEL 3 - EST PT Modifier: Quantity: 1 CPT4 Code: Description: ICD-10 Diagnosis Description L97.222 Non-pressure chronic ulcer of left calf with fat layer exposed L88 Pyoderma gangrenosum L97.321 Non-pressure chronic ulcer of left ankle limited to breakdown of skin I87.2 Venous insufficiency (chronic)  (peripheral) Modifier: Quantity: Electronic Signature(s) Signed: 08/14/2020 10:24:07 AM By: Worthy Keeler PA-C Entered By: Worthy Keeler on 08/14/2020 10:24:07

## 2020-08-14 NOTE — Progress Notes (Signed)
[name], [name] ([id number]) Visit Report for 08/14/2020 Arrival Information Details Patient Name: [name], [name] Date of Service: 08/14/2020 8:15 AM Medical Record Number: [id number] Patient Account Number: [id number] Date of Birth/Sex: [birth year] (40 y.o. M) Treating RN: Dolan Amen Primary Care Nezzie Manera: [name] Other Clinician: Jeanine Luz Referring Randol Zumstein: [name] Treating Ferron Ishmael/Extender: [name] Cliche in Treatment: 192 Visit Information History Since Last Visit Pain Present Now: Yes Patient Arrived: Ambulatory Arrival Time: 08:10 Accompanied By: self Transfer Assistance: None Patient Identification Verified: Yes Secondary Verification Process Completed: Yes Patient Requires Transmission-Based Precautions: No Patient Has Alerts: Yes Electronic Signature(s) Signed: 08/14/2020 5:07:34 PM By: Georges Mouse, Minus Breeding RN Entered By: Georges Mouse, Minus Breeding on 08/14/2020 08:22:27 [name] ([id number]) -------------------------------------------------------------------------------- Clinic Level of Care Assessment Details Patient Name: [name] Date of Service: 08/14/2020 8:15 AM Medical Record Number: [id number] Patient Account Number: [id number] Date of Birth/Sex: 1980-09-17 (40 y.o. M) Treating RN: Carlene Coria Primary Care Faryal Marxen: [name] Other Clinician: Jeanine Luz Referring Syrina Wake: [name] Treating Twylia Oka/Extender: [name] Cliche in Treatment: [facility] Level of Care Assessment Items TOOL 4 Quantity Score X - Use when only an EandM is performed on FOLLOW-UP visit 1 0 ASSESSMENTS - Nursing Assessment / Reassessment X - Reassessment of Co-morbidities (includes updates in patient status) 1 10 X- 1 5 Reassessment of Adherence to Treatment Plan ASSESSMENTS - Wound and Skin Assessment / Reassessment X - Simple Wound Assessment / Reassessment - one wound 1 5 []  - 0 Complex Wound Assessment / Reassessment -  multiple wounds []  - 0 Dermatologic / Skin Assessment (not related to wound area) ASSESSMENTS - Focused Assessment []  - Circumferential Edema Measurements - multi extremities 0 []  - 0 Nutritional Assessment / Counseling / Intervention []  - 0 Lower Extremity Assessment (monofilament, tuning fork, pulses) []  - 0 Peripheral Arterial Disease Assessment (using hand held doppler) ASSESSMENTS - Ostomy and/or Continence Assessment and Care []  - Incontinence Assessment and Management 0 []  - 0 Ostomy Care Assessment and Management (repouching, etc.) PROCESS - Coordination of Care X - Simple Patient / Family Education for ongoing care 1 15 []  - 0 Complex (extensive) Patient / Family Education for ongoing care X- 1 10 Staff obtains Consents, Records, Test Results / Process Orders []  - 0 Staff telephones HHA, Nursing Homes / Clarify orders / etc []  - 0 Routine Transfer to another Facility (non-emergent condition) []  - 0 Routine Hospital Admission (non-emergent condition) []  - 0 New Admissions / Biomedical engineer / Ordering NPWT, Apligraf, etc. []  - 0 Emergency Hospital Admission (emergent condition) X- 1 10 Simple Discharge Coordination []  - 0 Complex (extensive) Discharge Coordination PROCESS - Special Needs []  - Pediatric / Minor Patient Management 0 []  - 0 Isolation Patient Management []  - 0 Hearing / Language / Visual special needs []  - 0 Assessment of Community assistance (transportation, D/C planning, etc.) []  - 0 Additional assistance / Altered mentation []  - 0 Support Surface(s) Assessment (bed, cushion, seat, etc.) INTERVENTIONS - Wound Cleansing / Measurement [name], [name] ([id number]) X- 1 5 Simple Wound Cleansing - one wound []  - 0 Complex Wound Cleansing - multiple wounds X- 1 5 Wound Imaging (photographs - any number of wounds) []  - 0 Wound Tracing (instead of photographs) X- 1 5 Simple Wound Measurement - one wound []  - 0 Complex Wound Measurement -  multiple wounds INTERVENTIONS - Wound Dressings X - Small Wound Dressing one or multiple wounds 1 10 []  - 0 Medium Wound Dressing one or multiple wounds []  - 0 Large Wound  Dressing one or multiple wounds X- 1 5 Application of Medications - topical []  - 0 Application of Medications - injection INTERVENTIONS - Miscellaneous []  - External ear exam 0 []  - 0 Specimen Collection (cultures, biopsies, blood, body fluids, etc.) []  - 0 Specimen(s) / Culture(s) sent or taken to Lab for analysis []  - 0 Patient Transfer (multiple staff / Civil Service fast streamer / Similar devices) []  - 0 Simple Staple / Suture removal (25 or less) []  - 0 Complex Staple / Suture removal (26 or more) []  - 0 Hypo / Hyperglycemic Management (close monitor of Blood Glucose) []  - 0 Ankle / Brachial Index (ABI) - do not check if billed separately X- 1 5 Vital Signs Has the patient been seen at the hospital within the last three years: Yes Total Score: 90 Level Of Care: New/Established - Level 3 Electronic Signature(s) Signed: 08/14/2020 5:08:01 PM By: Carlene Coria RN Entered By: Carlene Coria on 08/14/2020 08:49:26 [name] ([id number]) -------------------------------------------------------------------------------- Encounter Discharge Information Details Patient Name: [name] Date of Service: 08/14/2020 8:15 AM Medical Record Number: [id number] Patient Account Number: [id number] Date of Birth/Sex: [birth year] (40 y.o. M) Treating RN: Dolan Amen Primary Care Emoni Yang: [name] Other Clinician: Jeanine Luz Referring Kasyn Rolph: [name] Treating Audry Pecina/Extender: [name] Cliche in Treatment: [phone] Encounter Discharge Information Items Discharge Condition: Stable Ambulatory Status: Ambulatory Discharge Destination: Home Transportation: Private Auto Accompanied By: self Schedule Follow-up Appointment: Yes Clinical Summary of Care: Electronic Signature(s) Signed: 08/14/2020 5:07:34 PM  By: Georges Mouse, Minus Breeding RN Entered By: Georges Mouse, Minus Breeding on 08/14/2020 09:13:50 [name] ([id number]) -------------------------------------------------------------------------------- Lower Extremity Assessment Details Patient Name: [name] Date of Service: 08/14/2020 8:15 AM Medical Record Number: [id number] Patient Account Number: [id number] Date of Birth/Sex: 03/21/1981 (39 y.o. M) Treating RN: Dolan Amen Primary Care Durk Carmen: [name] Other Clinician: Jeanine Luz Referring Joliene Salvador: [name] Treating Kolbey Teichert/Extender: Jeri Cos Weeks in Treatment: 192 Edema Assessment Assessed: [Left: Yes] [Right: No] Edema: [Left: N] [Right: o] Calf Left: Right: Point of Measurement: 33 cm From Medial Instep 50 cm Ankle Left: Right: Point of Measurement: 12 cm From Medial Instep 30 cm Electronic Signature(s) Signed: 08/14/2020 5:07:34 PM By: Georges Mouse, Minus Breeding RN Entered By: Georges Mouse, Minus Breeding on 08/14/2020 08:26:31 [name] ([id number]) -------------------------------------------------------------------------------- Multi Wound Chart Details Patient Name: [name] Date of Service: 08/14/2020 8:15 AM Medical Record Number: [id number] Patient Account Number: [id number] Date of Birth/Sex: 1981/03/21 (39 y.o. M) Treating RN: Carlene Coria Primary Care Theodosia Bahena: [name] Other Clinician: Jeanine Luz Referring Xianna Siverling: [name] Treating Josslynn Mentzer/Extender: [name] Cliche in Treatment: 192 Vital Signs Height(in): 71 Pulse(bpm): 50 Weight(lbs): 338 Blood Pressure(mmHg): 143/79 Body Mass Index(BMI): 47 Temperature(F): 98.3 Respiratory Rate(breaths/min): 18 Photos: [N/A:N/A] Wound Location: Left, Lateral Lower Leg N/A N/A Wounding Event: Gradually Appeared N/A N/A Primary Etiology: Pyoderma N/A N/A Comorbid History: Sleep Apnea, Hypertension, Colitis N/A N/A Date Acquired: 11/18/2015 N/A N/A Weeks of  Treatment: 192 N/A N/A Wound Status: Open N/A N/A Measurements L x W x D (cm) 6.6x8x0.8 N/A N/A Area (cm) : 41.469 N/A N/A Volume (cm) : 33.175 N/A N/A % Reduction in Area: -744.80% N/A N/A % Reduction in Volume: -744.80% N/A N/A Classification: Full Thickness With Exposed N/A N/A Support Structures Exudate Amount: Small N/A N/A Exudate Type: Purulent N/A N/A Exudate Color: yellow, brown, green N/A N/A Wound Margin: Epibole N/A N/A Granulation Amount: Medium (34-66%) N/A N/A Granulation Quality: Red, Pink N/A N/A Necrotic Amount: Medium (34-66%) N/A N/A Exposed Structures: Fat Layer (Subcutaneous Tissue): N/A N/A Yes  Muscle: Yes Fascia: No Tendon: No Joint: No Bone: No Epithelialization: None N/A N/A Treatment Notes Electronic Signature(s) Signed: 08/14/2020 5:08:01 PM By: Carlene Coria RN Entered By: Carlene Coria on 08/14/2020 08:48:18 [name] ([id number]) -------------------------------------------------------------------------------- Multi-Disciplinary Care Plan Details Patient Name: [name] Date of Service: 08/14/2020 8:15 AM Medical Record Number: [id number] Patient Account Number: [id number] Date of Birth/Sex: 11-17-1980 (39 y.o. M) Treating RN: Carlene Coria Primary Care Stuart Mirabile: [name] Other Clinician: Jeanine Luz Referring Bruchy Mikel: [name] Treating Verbon Giangregorio/Extender: [name] Cliche in Treatment: [place] reviewed with physician Active Inactive Wound/Skin Impairment Nursing Diagnoses: Impaired tissue integrity Knowledge deficit related to ulceration/compromised skin integrity Goals: Patient/caregiver will verbalize understanding of skin care regimen Date Initiated: 12/11/2016 Target Resolution Date: 08/24/2017 Goal Status: Active Ulcer/skin breakdown will have a volume reduction of 30% by week 4 Date Initiated: 12/11/2016 Date Inactivated: 06/26/2020 Target Resolution Date: 03/13/2017 Goal Status:  Unmet Unmet Reason: comorbities Ulcer/skin breakdown will have a volume reduction of 50% by week 8 Date Initiated: 12/11/2016 Date Inactivated: 06/26/2020 Target Resolution Date: 03/13/2017 Goal Status: Unmet Unmet Reason: comorbities Ulcer/skin breakdown will have a volume reduction of 80% by week 12 Date Initiated: 12/11/2016 Date Inactivated: 06/26/2020 Target Resolution Date: 03/13/2017 Goal Status: Unmet Unmet Reason: comorbities Ulcer/skin breakdown will heal within 14 weeks Date Initiated: 12/11/2016 Date Inactivated: 06/26/2020 Target Resolution Date: 03/13/2017 Goal Status: Unmet Unmet Reason: comorbities Interventions: Assess patient/caregiver ability to obtain necessary supplies Assess patient/caregiver ability to perform ulcer/skin care regimen upon admission and as needed Assess ulceration(s) every visit Provide education on ulcer and skin care Treatment Activities: Skin care regimen initiated : 12/11/2016 Topical wound management initiated : 12/11/2016 Notes: Electronic Signature(s) Signed: 08/14/2020 5:08:01 PM By: Carlene Coria RN Entered By: Carlene Coria on 08/14/2020 08:48:08 [name] ([id number]) -------------------------------------------------------------------------------- Pain Assessment Details Patient Name: [name] Date of Service: 08/14/2020 8:15 AM Medical Record Number: [id number] Patient Account Number: [id number] Date of Birth/Sex: [birth year] (40 y.o. M) Treating RN: Dolan Amen Primary Care Donato Studley: [name] Other Clinician: Jeanine Luz Referring Takari Duncombe: [name] Treating Verba Ainley/Extender: [name] Cliche in Treatment: 192 Active Problems Location of Pain Severity and Description of Pain Patient Has Paino Yes Site Locations Rate the pain. Current Pain Level: 1 Pain Management and Medication Current Pain Management: Electronic Signature(s) Signed: 08/14/2020 5:07:34 PM By: Georges Mouse, Minus Breeding RN Entered By:  Georges Mouse, Minus Breeding on 08/14/2020 08:22:50 [name], [name] ([id number]) -------------------------------------------------------------------------------- Patient/Caregiver Education Details Patient Name: [name] Date of Service: 08/14/2020 8:15 AM Medical Record Number: [id number] Patient Account Number: [id number] Date of Birth/Gender: July 27, 1980 (39 y.o. M) Treating RN: Carlene Coria Primary Care Physician: [name] Other Clinician: Jeanine Luz Referring Physician: [name] Treating Physician/Extender: [name] Cliche in Treatment: 32 Education Assessment Education Provided To: Patient Education Topics Provided Wound/Skin Impairment: Methods: Explain/Verbal Responses: State content correctly Electronic Signature(s) Signed: 08/14/2020 5:08:01 PM By: Carlene Coria RN Entered By: Carlene Coria on 08/14/2020 08:49:43 [name], [name] ([id number]) -------------------------------------------------------------------------------- Wound Assessment Details Patient Name: [name] Date of Service: 08/14/2020 8:15 AM Medical Record Number: [id number] Patient Account Number: [id number] Date of Birth/Sex: [birth year] (39 y.o. M) Treating RN: Dolan Amen Primary Care Milissa Fesperman: [name] Other Clinician: Jeanine Luz Referring Ramadan Couey: [name] Treating Charlaine Utsey/Extender: [name] Cliche in Treatment: 192 Wound Status Wound Number: 1 Primary Etiology: Pyoderma Wound Location: Left, Lateral Lower Leg Wound Status: Open Wounding Event: Gradually Appeared Comorbid History: Sleep Apnea, Hypertension, Colitis Date Acquired: 11/18/2015 Weeks Of Treatment: 192 Clustered Wound: No Photos Wound Measurements  Length: (cm) 6.6 Width: (cm) 8 Depth: (cm) 0.8 Area: (cm) 41.469 Volume: (cm) 33.175 % Reduction in Area: -744.8% % Reduction in Volume: -744.8% Epithelialization: None Tunneling: No Undermining: No Wound Description Classification: Full  Thickness With Exposed Support Structures Wound Margin: Epibole Exudate Amount: Small Exudate Type: Purulent Exudate Color: yellow, brown, green Foul Odor After Cleansing: No Slough/Fibrino Yes Wound Bed Granulation Amount: Medium (34-66%) Exposed Structure Granulation Quality: Red, Pink Fascia Exposed: No Necrotic Amount: Medium (34-66%) Fat Layer (Subcutaneous Tissue) Exposed: Yes Necrotic Quality: Adherent Slough Tendon Exposed: No Muscle Exposed: Yes Necrosis of Muscle: No Joint Exposed: No Bone Exposed: No Treatment Notes Wound #1 (Lower Leg) Wound Laterality: Left, Lateral Cleanser Soap and Water Discharge Instruction: Gently cleanse wound with antibacterial soap, rinse and pat dry prior to dressing wounds [name], [name] ([id number]) Peri-Wound Care Topical Triamcinolone Acetonide Cream, 0.1%, 15 (g) tube Discharge Instruction: to wound bed , lightly Primary Dressing Secondary Dressing Mepilex Border Flex, 6x6 (in/in) Discharge Instruction: Apply to wound as directed. Do not cut. Secured With Compression Wrap Compression Stockings Environmental education officer) Signed: 08/14/2020 5:07:34 PM By: Georges Mouse, Minus Breeding RN Entered By: Georges Mouse, Minus Breeding on 08/14/2020 08:25:29 [name], [name] ([id number]) -------------------------------------------------------------------------------- Vitals Details Patient Name: [name] Date of Service: 08/14/2020 8:15 AM Medical Record Number: [id number] Patient Account Number: [id number] Date of Birth/Sex: 1980/11/28 (39 y.o. M) Treating RN: Dolan Amen Primary Care Porfiria Heinrich: [name] Other Clinician: Jeanine Luz Referring Malijah Lietz: [name] Treating Riddhi Grether/Extender: [name] Cliche in Treatment: 192 Vital Signs Time Taken: 08:10 Temperature (F): 98.3 Height (in): 71 Pulse (bpm): 77 Weight (lbs): 338 Respiratory Rate (breaths/min): 18 Body Mass Index (BMI): 47.1 Blood Pressure (mmHg):  143/79 Reference Range: 80 - 120 mg / dl Electronic Signature(s) Signed: 08/14/2020 5:07:34 PM By: Georges Mouse, Minus Breeding RN Entered By: Georges Mouse, Minus Breeding on 08/14/2020 08:22:44

## 2020-08-15 ENCOUNTER — Telehealth: Payer: Self-pay | Admitting: *Deleted

## 2020-08-15 ENCOUNTER — Ambulatory Visit (INDEPENDENT_AMBULATORY_CARE_PROVIDER_SITE_OTHER): Payer: 59 | Admitting: Internal Medicine

## 2020-08-15 ENCOUNTER — Encounter: Payer: Self-pay | Admitting: Internal Medicine

## 2020-08-15 ENCOUNTER — Telehealth: Payer: Self-pay

## 2020-08-15 DIAGNOSIS — A498 Other bacterial infections of unspecified site: Secondary | ICD-10-CM | POA: Diagnosis not present

## 2020-08-15 NOTE — Telephone Encounter (Signed)
RN spoke to [place] at [facility] to relay verbal orders per Dr. Yazleen Molock Salon to continue IV cefepime 1 week (through 08/22/20). Orders repeated and verified.   Beryle Flock, RN

## 2020-08-15 NOTE — Telephone Encounter (Signed)
Has blood work been rechecked since that time? Can we please order a STAT bmp. Thanks.

## 2020-08-15 NOTE — Progress Notes (Signed)
RN performed blood draw via PICC line. Patient tolerated well, observed blood return. Catheter tubing and cap changed. Line flushed with saline and heparin and clamped.   Beryle Flock, RN

## 2020-08-15 NOTE — Telephone Encounter (Signed)
Patient's BMP sample hemolyzed. RN spoke to [place] at [facility], gave verbal orders per Dr. Shayona Hibbitts Salon for repeat BMP this afternoon via peripheral stick. Orders repeated and verified.   Jeani Hawking states blood draw will not be able to be performed until tomorrow (08/16/20), confirmed with Dr. Towanda Hornstein Salon that this is okay.  Beryle Flock, RN

## 2020-08-15 NOTE — Telephone Encounter (Signed)
Retrieved voicemail at 1134 from [place] at [facility].  Lynn left message at 9:58 with critical lab value, potassium = 6.0. This was drawn 3/24. Landis Gandy, RN

## 2020-08-15 NOTE — Addendum Note (Signed)
Addended by: Caffie Pinto on: 08/15/2020 05:41 PM   Modules accepted: Orders

## 2020-08-15 NOTE — Telephone Encounter (Signed)
RN followed up with [name]. [name] states a repeat lab was drawn 3/27 but has not yet resulted. [name] is working on tracking that lab result down now.  [name] spoke with the patient today. Patient was in no acute distress. States [name] had some heart palpitations in the past, but it was not enough to send him to ER. [name] is not on any potassium sparing diuretics, does not take potassium supplements.  Patient follows up at [facility] today at 3:15 with Dr Megan Salon.  [name] will try to get the repeat lab drawn 3/27 before his appointment.

## 2020-08-15 NOTE — Progress Notes (Signed)
[facility] for Infectious Disease  Patient Active Problem List   Diagnosis Date Noted  . Wound infection 07/25/2020    Priority: High  . Pseudomonas infection 07/25/2020    Priority: High  . 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 Essential hypertension 06/28/2020  . Normocytic anemia 07/07/2019  . Tobacco abuse 06/17/2018  . Ulcerative colitis ([place]) 03/05/2018  . Venous stasis 02/24/2018  . Chronic pain of left knee 12/05/2017  . Chronic pain of left ankle 12/05/2017  . Type 2 diabetes mellitus ([place]) 07/15/2017  . Seasonal allergic rhinitis 11/25/2016  . Depression 04/17/2015  . Hyperlipidemia 04/17/2015    Patient's Medications  New Prescriptions   No medications on file  Previous Medications   ACETAMINOPHEN (TYLENOL) 500 MG TABLET    Take 1,000 mg by mouth every 6 (six) hours as needed for headache (pain).   ATORVASTATIN (LIPITOR) 40 MG TABLET    Take 1 tablet (40 mg total) by mouth daily. For cholesterol.   BLOOD GLUCOSE METER KIT AND SUPPLIES KIT    Dispense based on patient and insurance preference. Use up to four times daily as directed. (FOR ICD-9 250.00, 250.01).   CEFEPIME 2 G IN SODIUM CHLORIDE 0.9 % 100 ML    Inject 2 g into the vein every 12 (twelve) hours.   CLOBETASOL OINTMENT (TEMOVATE) 0.05 %    Apply 1 application topically 2 (two) times daily.    DULAGLUTIDE (TRULICITY) 9.41 DE/0.8XK SOPN    Inject 0.75 mg into the skin once a week. For diabetes.   FLUTICASONE (FLONASE) 50 MCG/ACT NASAL SPRAY    Place 1 spray into both nostrils 2 (two) times daily.   IBUPROFEN (ADVIL,MOTRIN) 200 MG TABLET    Take 400-600 mg by mouth every 6 (six) hours as needed for headache (pain).   INFLIXIMAB (REMICADE) 100 MG INJECTION    Inject into the muscle once a week.   INSULIN GLARGINE (LANTUS SOLOSTAR) 100 UNIT/ML SOLOSTAR PEN    Inject 30 Units into the skin at bedtime.   METFORMIN (GLUCOPHAGE) 1000 MG TABLET    TAKE 1 TABLET BY MOUTH TWICE DAILY WITH  MEALS FOR  DIABETES   MUPIROCIN OINTMENT (BACTROBAN) 2 %    Apply topically two (2) 60g times a day. Mix with clobetasol and apply twice daily   RELION PEN NEEDLES 31G X 6 MM MISC    USE NIGHTLY WITH INSULIN AS DIRECTED   SANTYL OINTMENT    Apply topically.   TRIAMCINOLONE OINTMENT (KENALOG) 0.1 %    Apply topically.   VARENICLINE (CHANTIX) 1 MG TABLET    Take 1/2 tablet daily for three days, then 1/2 tablet twice daily for five days, then 1 tablet twice daily thereafter for smoking cessation.   VENLAFAXINE XR (EFFEXOR-XR) 150 MG 24 HR CAPSULE    Take 1 capsule (150 mg total) by mouth daily with breakfast. For depression.  Modified Medications   No medications on file  Discontinued Medications   No medications on file    Subjective: [name] is in for his routine follow-up visit.  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  He has now been on Remicade every 6 weeks for the past 11 months.  He felt like things were getting better in January of this year but has started to have more wound breakdown and drainage.  When he was seen at the wound center on 07/10/2020 "no infection" was noted but cultures were obtained which grew Pseudomonas resistant to quinolones and gentamicin.    He had a PICC placed and started cefepime on 08/01/2020.  He has not had any problems tolerating the PICC or cefepime.  He feels like his wound is looking better.  Lab work on 08/06/2020 revealed a potassium of greater than 10.  I do not believe we were made aware of this until this morning.  Labs were repeated 2 days ago and his potassium was still 6.7.  Review of Systems: Review of Systems  Gastrointestinal: Positive for constipation. Negative for abdominal pain, diarrhea, nausea and vomiting.    Past Medical History:  Diagnosis Date  . Blood in stool   . Depression   . Elevated blood pressure   . Hyperlipidemia   . OSA  (obstructive sleep apnea) 2014  . Seasonal allergies   . Ulcerative colitis ([place]) 03/05/2018    Social History   Tobacco Use  . Smoking status: Current Every Day Smoker    Packs/day: 1.00    Years: 14.00    Pack years: 14.00    Types: Cigarettes    Last attempt to quit: 11/25/2018    Years since quitting: 1.7  . Smokeless tobacco: Former Systems developer    Quit date: 03/17/2015  Vaping Use  . Vaping Use: Never used  Substance Use Topics  . Alcohol use: Not Currently    Alcohol/week: 0.0 standard drinks    Comment: soical    Family History  Problem Relation Age of Onset  . Alcohol abuse Paternal Aunt   . Alcohol abuse Paternal Uncle   . Stroke Paternal Uncle   . Hyperlipidemia Maternal Grandfather   . Hypertension Maternal Grandfather   . Diabetes Maternal Grandfather   . Alcohol abuse Maternal Grandfather   . Multiple sclerosis Mother   . Dementia Mother     Allergies  Allergen Reactions  . Biaxin [Clarithromycin] Other (See Comments)    Causes colitis flares  . Milk-Related Compounds Hives    Objective: Vitals:   08/15/20 1512  BP: (!) 156/85  Pulse: 96  Temp: 98 F (36.7 C)  TempSrc: Oral  SpO2: 95%  Weight: (!) 350 lb (158.8 kg)  Height: 5' 11"  (1.803 m)   Body mass index is 48.82 kg/m.  Physical Exam Constitutional:      Comments: His spirits are good.  Cardiovascular:     Rate and Rhythm: Normal rate.  Pulmonary:     Effort: Pulmonary effort is normal.  Skin:    Comments: His chronic ulcer remains unchanged over the past week.  Psychiatric:        Mood and Affect: Mood normal.      Photo today     Problem List Items Addressed This Visit      High   Pseudomonas infection    He was seen at the wound center yesterday and quite strong felt like his wound was showing significant improvement.  [name] says that they discussed possibly continuing cefepime for 1 more week.  He is very much in favor of that.  I suspect that his recent hyperkalemia is  due to hemolysis of the specimen.  I will repeat be met here today and continue cefepime for now.  He will follow-up in 1 week.      Relevant Orders   Basic metabolic panel       Michel Bickers, MD [facility] [phone] pager   [phone] cell 08/15/2020, 3:29 PM

## 2020-08-15 NOTE — Telephone Encounter (Signed)
We can also ask Dr. Megan Salon to check a BMET during visit.

## 2020-08-15 NOTE — Assessment & Plan Note (Signed)
He was seen at the wound center yesterday and quite strong felt like his wound was showing significant improvement.  Jeremia says that they discussed possibly continuing cefepime for 1 more week.  He is very much in favor of that.  I suspect that his recent hyperkalemia is due to hemolysis of the specimen.  I will repeat be met here today and continue cefepime for now.  He will follow-up in 1 week.

## 2020-08-21 ENCOUNTER — Other Ambulatory Visit: Payer: Self-pay

## 2020-08-21 ENCOUNTER — Encounter: Payer: 59 | Attending: Physician Assistant | Admitting: Physician Assistant

## 2020-08-21 DIAGNOSIS — L97825 Non-pressure chronic ulcer of other part of left lower leg with muscle involvement without evidence of necrosis: Secondary | ICD-10-CM | POA: Insufficient documentation

## 2020-08-21 DIAGNOSIS — I872 Venous insufficiency (chronic) (peripheral): Secondary | ICD-10-CM | POA: Diagnosis not present

## 2020-08-21 DIAGNOSIS — Z91011 Allergy to milk products: Secondary | ICD-10-CM | POA: Insufficient documentation

## 2020-08-21 DIAGNOSIS — L88 Pyoderma gangrenosum: Secondary | ICD-10-CM | POA: Diagnosis not present

## 2020-08-21 DIAGNOSIS — F17210 Nicotine dependence, cigarettes, uncomplicated: Secondary | ICD-10-CM | POA: Insufficient documentation

## 2020-08-21 NOTE — Progress Notes (Addendum)
[name], [name] ([id number]) Visit Report for 08/21/2020 Chief Complaint Document Details Patient Name: [name], [name] Date of Service: 08/21/2020 9:15 AM Medical Record Number: [id number] Patient Account Number: [id number] Date of Birth/Sex: 11-14-1980 (40 y.o. 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 M) Treating RN: Carlene Coria Primary Care Provider: [name] Other Clinician: Jeanine Luz Referring Provider: [name] Treating Provider/Extender: [name] Cliche in Treatment: 89 History of Present Illness HPI Description: 12/04/16; 40 year old man who comes into the clinic today for review of a wound on the posterior left calf. He tells me that is been there for about a year. He is not a diabetic he does smoke half a pack per day. He was seen in the ER on 11/20/16 felt to have cellulitis around the wound and was given clindamycin. An x-ray did not show osteomyelitis. The patient initially tells me that he has a milk allergy that sets off a pruritic itching rash on his lower legs which she scratches incessantly and he thinks that's what may have set up the wound. He has been using various topical antibiotics and ointments without any effect. He works in a [employer] and is on his feet all day. He  does not have a prior history of wounds however he does have the rash on both lower legs the right arm and the ventral aspect of his left arm. These are excoriations and clearly have had scratching however there are of macular looking areas on both legs including a substantial larger area on the right leg. This does not have an underlying open area. There is no blistering. The patient tells me that 2 years ago in [place] in response to the rash on his legs he saw a dermatologist who told him he had a condition which may be pyoderma gangrenosum although I may be putting words into his mouth. He seemed to recognize this. 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 He is not in any treatment for this. He's had no recent travel 12/11/16; the patient arrives today with his wound and roughly the same condition we've been using silver alginate this is a deep punched out wound with some surrounding erythema but no tenderness. Biopsy I did did not show confirmed pyoderma gangrenosum suggested nonspecific inflammation and vasculitis but does not provide an actual description of what was seen by the pathologist. I'm really not able to understand this We have also received information from the patient's dermatologist in [place] notes from April 2016. This was a doctor Agarwal-antal. 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 He was prescribed topical steroid high potency under occlusion which helped but at this point the patient did not have a deep punched out wound. 12/18/16; the patient's wound is larger in terms of surface area however this surface looks better and there is less depth. The surrounding erythema also is better. The patient states that the wrap we put on came off 2 days ago when he has been  using his compression stockings. He we are in the process of getting a dermatology consult. 12/26/16 on evaluation today patient's left lower extremity wound  shows evidence of infection with surrounding erythema noted. He has been tolerating the dressing changes but states that he has noted more discomfort. There is a larger area of erythema surrounding the wound. No fevers, chills, nausea, or vomiting noted at this time. With that being said the wound still does have slough covering the surface. He is not allergic to any medication that he is aware of at this point. In regard to his right lower extremity he had several regions that are erythematous and pruritic he wonders if there's anything we can do to help that. 01/02/17 I reviewed patient's wound culture which was obtained his visit last week. He was placed on doxycycline at that point. Unfortunately that does not appear to be an antibiotic that would likely help with the situation however the pseudomonas noted on culture is sensitive to Cipro. Also unfortunately patient's wound seems to have a large compared to last week's evaluation. Not severely so but there are definitely increased measurements in general. He is continuing to have discomfort as well he writes this to be a seven out of 10. In fact he would prefer me not to perform any debridement today due to the fact that he is having discomfort and considering he has an active infection on the little reluctant to do so anyway. No fevers, chills, nausea, or vomiting noted at this time. 01/08/17; patient seems dermatology on September 5. I suspect dermatology will want the slides from the biopsy I did sent to their pathologist. I'm not sure if there is a way we can expedite that. In any case the culture I did before I left on vacation 3 weeks ago showed Pseudomonas he was given 10 days of Cipro and per her description of her intake nurses is actually somewhat better this week although the wound is quite a bit bigger than I remember the last time I saw this. He still has 3 more days of Cipro 01/21/17; dermatology appointment tomorrow. He has completed  the ciprofloxacin for Pseudomonas. Surface of the wound looks better however he is had some deterioration in the lesions on his right leg. Meantime the left lateral leg wound we will continue with sample 01/29/17; patient had his dermatology appointment but I can't yet see that note. He is completed his antibiotics. The wound is more superficial but considerably larger in circumferential area than when he came in. This is in his left lateral calf. He also has swollen erythematous areas with superficial wounds on the right leg and small papular areas on both arms. There apparently areas in her his upper thighs and buttocks I did not look at those. Dermatology biopsied the right leg. Hopefully will have their input next week. 02/05/17; patient went back to see his dermatologist who told him that he had a "scratching problem" as well as staph. He is now on a 30 day course of doxycycline and I believe she gave him triamcinolone cream to the right leg areas to help with the itching [not exactly sure but probably triamcinolone]. She apparently looked at the left lateral leg wound although this was not rebiopsied and I think felt to be ultimately part of the same pathogenesis. He is using sample border foam and changing nevus himself. He now has a new open area on the right posterior leg which was his biopsy site I don't have any  of the dermatology notes 02/12/17; we put the patient in compression last week with SANTYL to the wound on the left leg and the biopsy. Edema is much better and the depth of the wound is now at level of skin. Area is still the same oBiopsy site on the right lateral leg we've also been using santyl with a border foam dressing and he is changing this himself. 02/19/17; Using silver alginate started last week to both the substantial left leg wound and the biopsy site on the right wound. He is tolerating compression well. Has a an appointment with his primary M.D. tomorrow wondering about  diuretics although I'm wondering if the edema problem is actually lymphedema 02/26/17; the patient has been to see his primary doctor Dr. Jerrel Ivory at [place] our primary care. She started him on Lasix 20 mg and this seems to have helped with the edema. However we are not making substantial change with the left lateral calf wound and inflammation. The biopsy site on the right leg also looks stable but not really all that different. 03/12/17; the patient has been to see vein and vascular Dr. Lucky Cowboy. He has had venous reflux studies I have not reviewed these. I did get a call from his dermatology office. They felt that he might have pathergy based on their biopsy on his right leg which led them to look at the slides of DIOR, STEPTER ([id number]) the biopsy I did on the left leg and they wonder whether this represents pyoderma gangrenosum which was the original supposition in a man with ulcerative colitis albeit inactive for many years. They therefore recommended clobetasol and tetracycline i.e. 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 03/26/17; apparently the patient just had reflux studies not an appointment with Dr. dew. She arrives in clinic today having applied clobetasol for 2-3 weeks. He notes over the last 2-3 days excessive drainage having to change the dressing 3-4 times a day and also expanding erythema. 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 Lillia Carmel at [facility] dermatology locally who kindly saw him at my request. 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 My concern was the degree of erythema medially extending from his left leg wound which was either  inflammation from pyoderma or cellulitis. I put him on Augmentin however culture of the wound showed Pseudomonas which is quinolone sensitive. I really don't believe he has cellulitis however in view of everything I will continue and give him a course of Cipro. He is also on doxycycline as an immune modulator for the pyoderma. In addition to his original wound on the left lateral leg with surrounding erythema he has a wound on the right posterior calf which was an original biopsy site done by dermatology. 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 Saw Dr. Lillia Carmel yesterday. He has been using topical antibiotics to both wound areas his original wound on the left and the biopsies/pathergy area on the right. There is definitely some improvement in the inflammation around the wound on the right although the patient states he has increasing sensitivity of the wounds. He is on prednisone 60 and doxycycline 1 as prescribed by Dr. Lillia Carmel. He is covering the  topical antibiotic with gauze and putting this in his own compression stocks and changing this daily. He states that Dr. Lottie Rater did a culture of the left leg wound yesterday 05/07/17; pyoderma gangrenosum. The patient saw Dr. Lillia Carmel yesterday and has a follow-up with her in one month. He is still using topical antibiotics to both wounds although he can't recall exactly what type. He is still on prednisone 60 mg. Dr. Lillia Carmel stated that the doxycycline could stop if we were in agreement. 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 The right medial leg was induced by biopsy/pathergy. The area on the right is essentially healed. 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 The right medial leg has  closed and remains closed. He is still on prednisone 60. oHe tells me he missed his last dermatology appointment with Dr. Lillia Carmel but will make another appointment. He reports that her blood sugar at a recent screen in [place] was high 200's. He was 180 today. 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 I think he is going to require still much longer prednisone perhaps another 3 months before attempting to taper. In the meantime his wound is a lot better. Smaller. He is cleaning this off daily and applying topical antibiotics. When he was last in the clinic I thought about changing to [facility] and actually put in a couple of calls to dermatology although probably not during their business hours. 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 He continues on oral prednisone. He has been using triple antibiotic ointment. There is surface debris and we will transition to [facility] and have him return in 2 weeks. He has lost 30 pounds since his last appointment with lifestyle modification. He may benefit from topical steroid cream for treatment this can be considered at a later date. 08/22/17 on evaluation today patient appears to actually be doing rather well in regard to his left lateral lower extremity ulcer. He has actually been managed by Dr. Dellia Nims most recently. Patient is currently on oral steroids at this time. This seems to have been of benefit for him. Nonetheless his last visit was actually with Leah on 08/06/17. Currently he is not utilizing any topical steroid creams although this could be of benefit as well. No fevers, chills, nausea, or vomiting noted at this time. 09/05/17 on evaluation today patient appears to be doing better in regard to his left lateral lower extremity ulcer. He has been tolerating the dressing changes without complication. He is using Santyl  with good effect. Overall I'm very pleased with how things are standing at this point. Patient likewise is happy that this is doing better. 09/19/17 on evaluation today patient actually appears to be doing rather well in regard to his left lateral lower extremity ulcer. 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 He's not having any significant pain. 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 He has been tolerating the Santyl without complication and in general I feel like he's making good progress. 10/17/17  on evaluation today patient appears to be doing very well in regard to his left lateral lower surety ulcer. He has been tolerating the dressing changes without complication. There does not appear to be any evidence of infection he's alternating the Santyl and the triple antibiotic ointment every other day this seems to be doing well for him. 11/03/17 on evaluation today patient appears to be doing very well in regard to his left lateral lower extremity ulcer. He is been tolerating the dressing changes without complication which is good news. Fortunately there does not appear to be any evidence of infection which is also great news. Overall is doing excellent they are starting to taper down on the prednisone is down to 40 mg at this point it also started topical clobetasol for him. 11/17/17 on evaluation today patient appears to be doing well in regard to his left lateral lower surety ulcer. He's been tolerating the dressing changes without complication. He does note that he is having no pain, no excessive drainage or discharge, and overall he feels like things are going about how he would expect and hope they would. Overall he seems to have no evidence of infection at this time in my opinion which is  good news. 12/04/17-He is seen in follow-up evaluation for right lateral lower extremity ulcer. He has been applying topical steroid cream. Today's measurement show slight increase in size. Over the next 2 weeks we will transition to every other day Santyl and steroid cream. He has been encouraged to monitor for changes and notify clinic with any concerns 12/15/17 on evaluation today patient's left lateral motion the ulcer and fortunately is doing worse again at this point. This just since last week to this week has close to doubled in size according to the patient. I did not seeing last week's I do not have a visual to compare this to in our system was also down so we do not have all the charts and at this point. Nonetheless it does have me somewhat concerned in regard to the fact that again he was worried enough about it he has contact the dermatology that placed them back on the full strength, 50 mg a day of the prednisone that he was taken previous. He continues to alternate using clobetasol along with Santyl at this point. He is obviously somewhat frustrated. 12/22/17 on evaluation today patient appears to be doing a little worse compared to last evaluation. Unfortunately the wound is a little deeper and slightly larger than the last week's evaluation. With that being said he has made some progress in regard to the irritation surrounding at this time unfortunately despite that progress that's been made he still has a significant issue going on here. 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 [name], [name] ([id number]) For that reason I discussed with him today the possibility of perform a wound culture to ensure there's nothing overtly infected. 01/06/18 on evaluation today patient's wound is larger and deeper than previously evaluated. With that being  said it did appear that his wound was infected after my last evaluation with him. Subsequently I did end up prescribing a prescription for Bactrim DS which she has been taking and having no complication with. Fortunately there does not appear to be any evidence of infection at this point in time as far as anything spreading, no want to touch, and  overall I feel like things are showing signs of improvement. 01/13/18 on evaluation today patient appears to be even a little larger and deeper than last time. There still muscle exposed in the base of the wound. Nonetheless he does appear to be less erythematous I do believe inflammation is calming down also believe the infection looks like it's probably resolved at this time based on what I'm seeing. No fevers, chills, nausea, or vomiting noted at this time. 01/30/18 on evaluation today patient actually appears to visually look better for the most part. Unfortunately those visually this looks better he does seem to potentially have what may be an abscess in the muscle that has been noted in the central portion of the wound. This is the first time that I have noted what appears to be fluctuance in the central portion of the muscle. With that being said I'm somewhat more concerned about the fact that this might indicate an abscess formation at this location. I do believe that an ultrasound would be appropriate. This is likely something we need to try to do as soon as possible. He has been switch to mupirocin ointment and he is no longer using the steroid ointment as prescribed by dermatology he sees them again next week he's been decreased from 60 to 40 mg of prednisone. 03/09/18 on evaluation today patient actually appears to be doing a little better compared to last time I saw him. There's not as much erythema surrounding the wound itself. He I did review his most recent infectious disease note which was dated 02/24/18. He saw Dr. Michel Bickers in [place].  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 Patient is now off of antibiotics and they are continually observing this. There seems to be no change in the past two weeks in my pinion based on what the patient says and what I see today compared to what Dr. Megan Salon likely saw two weeks ago. No fevers, chills, nausea, or vomiting noted at this time. 03/23/18 on evaluation today patient's wound actually appears to be showing signs of improvement which is good news. He is currently still on the Dapsone. He is also working on tapering the prednisone to get off of this and Dr. Lottie Rater is working with him in this regard. Nonetheless overall I feel like the wound is doing well it does appear based on the infectious disease note that I reviewed from Dr. 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 I did also review the results of his ultrasound of left lower extremity which revealed there was a dentist tissue in the base of the wound without an abscess noted. 04/06/18 on evaluation today the patient's left lateral lower extremity ulcer actually appears to be doing fairly well which is excellent news. There does not appear to be any evidence of infection at this time which is also great news. Overall he still does have a significantly large ulceration although little by little he seems to be making progress. He is down to 10 mg a day of the prednisone. 04/20/18 on evaluation today patient actually appears to be doing excellent at this time in regard to his left lower extremity ulcer. He's making signs of good progress unfortunately this is taking much longer than we would really like to see  but nonetheless he is making progress. Fortunately there does not appear to be any evidence of infection at this time. No  fevers, chills, nausea, or vomiting noted at this time. The patient has not been using the Santyl due to the cost he hadn't got in this field yet. He's mainly been using the antibiotic ointment topically. Subsequently he also tells me that he really has not been scrubbing in the shower I think this would be helpful again as I told him it doesn't have to be anything too aggressive to even make it believe just enough to keep it free of some of the loose slough and biofilm on the wound surface. 05/11/18 on evaluation today patient's wound appears to be making slow but sure progress in regard to the left lateral lower extremity ulcer. He is been tolerating the dressing changes without complication. Fortunately there does not appear to be any evidence of infection at this time. He is still just using triple antibiotic ointment along with clobetasol occasionally over the area. He never got the Santyl and really does not seem to intend to in my pinion. 06/01/18 on evaluation today patient appears to be doing a little better in regard to his left lateral lower extremity ulcer. He states that overall he does not feel like he is doing as well with the Dapsone as he did with the prednisone. Nonetheless he sees his dermatologist later today and is gonna talk to them about the possibility of going back on the prednisone. Overall again I believe that the wound would be better if you would utilize Santyl but he really does not seem to be interested in going back to the [place] at this point. He has been using triple antibiotic ointment. 06/15/18 on evaluation today patient's wound actually appears to be doing about the same at this point. Fortunately there is no signs of infection at this time. He has made slight improvements although he continues to not really want to clean the wound bed at this point. He states that he just doesn't mess with it he doesn't want to cause any problems with everything else he has going  on. He has been on medication, antibiotics as prescribed by his dermatologist, for a staff infection of his lower extremities which is really drying out now and looking much better he tells me. Fortunately there is no sign of overall infection. 06/29/18 on evaluation today patient appears to be doing well in regard to his left lateral lower surety ulcer all things considering. Fortunately his staff infection seems to be greatly improved compared to previous. He has no signs of infection and this is drying up quite nicely. He is still the doxycycline for this is no longer on cental, Dapsone, or any of the other medications. His dermatologist has recommended possibility of an infusion but right now he does not want to proceed with that. 07/13/18 on evaluation today patient appears to be doing about the same in regard to his left lateral lower surety ulcer. Fortunately there's no signs of infection at this time which is great news. Unfortunately he still builds up a significant amount of Slough/biofilm of the surface of the wound he still is not really cleaning this as he should be appropriately. Again I'm able to easily with saline and gauze remove the majority of this on the surface which if you would do this at home would likely be a dramatic improvement for him as far as getting the area to improve. Nonetheless overall I  still feel like he is making progress is just very slow. I think Santyl will be of benefit for him as well. Still he has not gotten this as of this point. 07/27/18 on evaluation today patient actually appears to be doing little worse in regards of the erythema around the periwound region of the wound he also tells me that he's been having more drainage currently compared to what he was experiencing last time I saw him. He states not quite as bad as what he had because this was infected previously but nonetheless is still appears to be doing poorly. Fortunately there is no evidence  of systemic infection at this point. The patient tells me that he is not going to be able to afford the Santyl. He is still waiting to hear about the infusion therapy with his dermatologist. Apparently she wants an updated colonoscopy first. 08/10/18 on evaluation today patient appears to be doing better in regard to his left lateral lower extremity ulcer. Fortunately he is showing signs of improvement in this regard he's actually been approved for Remicade infusion's as well although this has not been scheduled as of yet. Fortunately there's no signs of active infection at this time in regard to the wound although he is having some issues with infection of the right lower extremity is been seen as dermatologist for this. Fortunately they are definitely still working with him trying to keep things under control. [name], [name] ([id number]) 09/07/18 on evaluation today patient is actually doing rather well in regard to his left lateral lower extremity ulcer. He notes these actually having some hair grow back on his extremity which is something he has not seen in years. He also tells me that the pain is really not giving them any trouble at this time which is also good news overall she is very pleased with the progress he's using a combination of the mupirocin along with the probate is all mixed. 09/21/18 on evaluation today patient actually appears to be doing fairly well all things considered in regard to his looks from the ulcer. He's been tolerating the dressing changes without complication. Fortunately there's no signs of active infection at this time which is good news he is still on all antibiotics or prevention of the staff infection. He has been on prednisone for time although he states it is gonna contact his dermatologist and see if she put them on a short course due to some irritation that he has going on currently. Fortunately there's no evidence of any overall worsening this is going very slow  I think cental would be something that would be helpful for him although he states that $50 for tube is quite expensive. He therefore is not willing to get that at this point. 10/06/18 on evaluation today patient actually appears to be doing decently well in regard to his left lateral leg ulcer. He's been tolerating the dressing changes without complication. Fortunately there's no signs of active infection at this time. Overall I'm actually rather pleased with the progress he's making although it's slow he doesn't show any signs of infection and he does seem to be making some improvement. I do believe that he may need a switch up and dressings to try to help this to heal more appropriately and quickly. 10/19/18 on evaluation today patient actually appears to be doing better in regard to his left lateral lower extremity ulcer. This is shown signs of having much less Slough buildup at this point due to the fact  he has been using the Santyl. Obviously this is very good news. The overall size of the wound is not dramatically smaller but again the appearance is. 11/02/18 on evaluation today patient actually appears to be doing quite well in regard to his lower [place] ulcer. A lot of the skin around the ulcer is actually somewhat irritating at this point this seems to be more due to the dressing causing irritation from the adhesive that anything else. Fortunately there is no signs of active infection at this time. 11/24/18 on evaluation today patient appears to be doing a little worse in regard to his overall appearance of his lower extremity ulcer. There's more erythema and warmth around the wound unfortunately. He is currently on doxycycline which he has been on for some time. With that being said I'm not sure that seems to be helping with what appears to possibly be an acute cellulitis with regard to his left lower extremity ulcer. No fevers, chills, nausea, or vomiting noted at this time. 12/08/18 on  evaluation today patient's wounds actually appears to be doing significantly better compared to his last evaluation. He has been using Santyl along with alternating tripling about appointment as well as the steroid cream seems to be doing quite well and the wound is showing signs of improvement which is excellent news. Fortunately there's no evidence of infection and in fact his culture came back negative with only normal skin flora noted. 12/21/2018 upon evaluation today patient actually appears to be doing excellent with regard to his ulcer. This is actually the best that I have seen it since have been helping to take care of him. It is both smaller as well as less slough noted on the surface of the wound and seems to be showing signs of good improvement with new skin growing from the edges. He has been using just the triamcinolone he does wonder if he can get a refill of that ointment today. 01/04/2019 upon evaluation today patient actually appears to be doing well with regard to his left lateral lower extremity ulcer. With that being said it does not appear to be that he is doing quite as well as last time as far as progression is concerned. There does not appear to be any signs of infection or significant irritation which is good news. With that being said I do believe that he may benefit from switching to a collagen based dressing based on how clean The wound appears. 01/18/2019 on evaluation today patient actually appears to be doing well with regard to his wound on the left lower extremity. He is not made a lot of progress compared to where we were previous but nonetheless does seem to be doing okay at this time which is good news. There is no signs of active infection which is also good news. My only concern currently is I do wish we can get him into utilizing the collagen dressing his insurance would not pay for the supplies that we ordered although it appears that he may be able to order this  through his supply company that he typically utilizes. This is [employer]. Nonetheless he did try to order it during the office visit today and it appears this did go through. We will see if he can get that it is a different brand but nonetheless he has collagen and I do think will be beneficial. 02/01/2019 on evaluation today patient actually appears to be doing a little worse today in regard to the overall size of  his wounds. Fortunately there is no signs of active infection at this time. That is visually. Nonetheless when this is happened before it was due to infection. For that reason were somewhat concerned about that this time as well. 02/08/2019 on evaluation today patient unfortunately appears to be doing slightly worse with regard to his wound upon evaluation today. Is measuring a little deeper and a little larger unfortunately. I am not really sure exactly what is causing this to enlarge he actually did see his dermatologist she is going to see about initiating Humira for him. Subsequently she also did do steroid injections into the wound itself in the periphery. Nonetheless still nonetheless he seems to be getting a little bit larger he is gone back to just using the steroid cream topically which I think is appropriate. I would say hold off on the collagen for the time being is definitely a good thing to do. Based on the culture results which we finally did get the final result back regarding it shows staph as the bacteria noted again that can be a normal skin bacteria based on the fact however he is having increased drainage and worsening of the wound measurement wise I would go ahead and place him on an antibiotic today I do believe for this. 02/15/2019 on evaluation today patient actually appears to be doing somewhat better in regard to his ulcer. There is no signs of worsening at this time I did review his culture results which showed evidence of Staphylococcus aureus but not MRSA. Again  this could just be more related to the normal skin bacteria although he states the drainage has slowed down quite a bit he may have had a mild infection not just colonization. And was much smaller and then since around10/04/2019 on evaluation today patient appears to be doing unfortunately worse as far as the size of the wound. I really feel like that this is steadily getting larger again it had been doing excellent right at the beginning of September we have seen a steady increase in the area of the wound it is almost 2-1/2 times the size it was on September 1. Obviously this is a bad trend this is not wanting to see. For that reason we went back to using just the topical triamcinolone cream which does seem to help with inflammation. I checked him for bacteria by way of culture and nothing showed positive there. I am considering giving him a short course of a tapering steroid Dosepak today to see if that is can be beneficial for him. The patient is in agreement with giving that a try. 03/08/2019 on evaluation today patient appears to be doing very well in comparison to last evaluation with regard to his lower extremity ulcer. This is showing signs of less inflammation and actually measuring slightly smaller compared to last time every other week over the past month and a half he has been measuring larger larger larger. Nonetheless I do believe that the issue has been inflammation the prednisone does seem to [facility], Jaishaun ([id number]) have been beneficial for him which is good news. No fevers, chills, nausea, vomiting, or diarrhea. 03/22/2019 on evaluation today patient appears to be doing about the same with regard to his leg ulcer. He has been tolerating the dressing changes without complication. With that being said the wound seems to be mostly arrested at its current size but really is not making any progress except for when we prescribed the prednisone. He did show some signs of  dropping as far as  the overall size of the wound during that interval week. Nonetheless this is something he is not on long-term at this point and unfortunately I think he is getting need either this or else the Humira which his dermatologist has discussed try to get approval for. With that being said he will be seeing his dermatologist on the 11th of this month that is November. 04/19/2019 on evaluation today patient appears to be doing really about the same the wound is measuring slightly larger compared to last time I saw him. He has not been into the office since November 2 due to the fact that he unfortunately had Covid as that his entire family. He tells me that it was rough but they did pull-through and he seems to be doing much better. Fortunately there is no signs of active infection at this time. No fevers, chills, nausea, vomiting, or diarrhea. 05/10/2019 on evaluation today patient unfortunately appears to be doing significantly worse as compared to last time I saw him. He does tell me that he has had his first dose of Humira and actually is scheduled to get the next one in the upcoming week. With that being said he tells me also that in the past several days he has been having a lot of issues with green drainage she showed me a picture this is more blue-green in color. He is also been having issues with increased sloughy buildup and the wound does appear to be larger today. Obviously this is not the direction that we want everything to take based on the starting of his Humira. Nonetheless I think this is definitely a result of likely infection and to be honest I think this is probably Pseudomonas causing the infection based on what I am seeing. 05/24/2019 on evaluation today patient unfortunately appears to be doing significantly worse compared to his prior evaluation with me 2 weeks ago. I did review his culture results which showed that he does have Staph aureus as well as Pseudomonas noted on the culture.  Nonetheless the Levaquin that I prescribed for him does not appear to have been appropriate and in fact he tells me he is no longer experiencing the green drainage and discharge that he had at the last visit. Fortunately there is no signs of active infection at this time which is good news although the wound has significantly worsened it in fact is much deeper than it was previous. We have been utilizing up to this point triamcinolone ointment as the prescription topical of choice but at this time I really feel like that the wound is getting need to be packed in order to appropriately manage this due to the deeper nature of the wound. Therefore something along the lines of an alginate dressing may be more appropriate. 05/31/2019 upon inspection today patient's wound actually showed signs of doing poorly at this point. Unfortunately he just does not seem to be making any good progress despite what we have tried. He actually did go ahead and pick up the Cipro and start taking that as he was noticing more green drainage he had previously completed the Levaquin that I prescribed for him as well. Nonetheless he missed his appointment for the seventh last week on Wednesday with the wound care center and [facility] where his dermatologist referred him. Obviously I do think a second opinion would be helpful at this point especially in light of the fact that the patient seems to be doing so  poorly despite the fact that we have tried everything that I really know how at this point. The only thing that ever seems to have helped him in the past is when he was on high doses of continual steroids that did seem to make a difference for him. 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 06/07/2019 upon evaluation today patient unfortunately appears to be doing  worse yet again with regard to his wound. In fact I am starting to question whether or not he may have a fluid pocket in the muscle at this point based on the bulging and the soft appearance to the central portion of the muscle area. There is not anything draining from the muscle itself at this time which is good news but nonetheless the wound is expanding. I am not really seeing any results of the Humira as far as overall wound progression based on what I am seeing at this point. The patient has been referred for second opinion with regard to his wound to the [facility] wound care center by his dermatologist which I definitely am not in opposition to. Unfortunately we tried multiple dressings in the past including collagen, alginate, and at one point even Hydrofera Blue. With that being said he is never really used it for any significant amount of time due to the fact that he often complains of pain associated with these dressings and then will go back to either using the Santyl which she has done intermittently or more frequently the triamcinolone. He is also using his own compression stockings. We have wrapped him in the past but again that was something else that he really was not a big fan of. 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 He seemed to do much better when he was on the steroids although honestly I understand that the steroids are not necessarily the best medication to be on long-term obviously 06/14/2019 on evaluation today patient appears to be doing actually a little bit better with regard to the overall appearance with his leg. Unfortunately he does continue to have  issues with what appears to be some fluid underneath the muscle although he did see the wound specialty center at [facility] last week their main goals were to see about infusion therapy in place of the Humira as they feel like that is not quite strong enough. They also recommended that we continue with the treatment otherwise as we are they felt like that was appropriate and they are okay with him continuing to follow-up here with [place] in that regard. With that being said they are also sending him to the vein specialist there to see about vein stripping and if that would be of benefit for him. Subsequently they also did not really address whether or not an ultrasound of the muscle area to see if there is anything that needs to be addressed here would be appropriate or not. For that reason I discussed this with him last  week I think we may proceed down that road at this point. 06/21/2019 upon evaluation today patient's wound actually appears to be doing slightly better compared to previous evaluations. I do believe that he has made a difference with regard to the progression here with the use of oral steroids. Again in the past has been the only thing that is really calm things down. He does tell me that from [facility] is gotten a good news from there that there are no further vein stripping that is necessary at this point. I do not have that available for review today although the patient did relay this to me. He also did obtain and have the ultrasound of the wound completed which I did sign off on today. It does appear that there is no fluid collection under the muscle this is likely then just edematous tissue in general. That is also good news. Overall I still believe the inflammation is the main issue here. He did inquire about the possibility of a wound VAC again with the muscle protruding like it is I am not really sure whether the wound VAC is necessarily ideal or not. That is something we will have to consider  although I do believe he may need compression wrapping to try to help with edema control which could potentially be of benefit. 06/28/2019 on evaluation today patient appears to be doing slightly better measurement wise although this is not terribly smaller he least seems to be trending towards that direction. With that being said he still seems to have purulent drainage noted in the wound bed at this time. He has been on Levaquin followed by Cipro over the past month. Unfortunately he still seems to have some issues with active infection at this time. I did perform a culture last week in order to evaluate and see if indeed there was still anything going on. Subsequently the culture did come back showing Pseudomonas which is consistent with the drainage has been having which is blue-green in color. He also has had an odor that again was somewhat consistent with Pseudomonas as well. Long story short it appears that the culture showed an intermediate finding with regard to how well the Cipro will work for the Pseudomonas infection. Subsequently being that he does not seem to be clearing up and at best what we are doing is just keeping this at [place] I think he may need to see infectious disease to discuss IV antibiotic options. KELAND, PEYTON ([id number]) 07/05/2019 upon evaluation today patient appears to be doing okay in regard to his leg ulcer. He has been tolerating the dressing changes at this point without complication. Fortunately there is no signs of active infection at this time which is good news. No fevers, chills, nausea, vomiting, or diarrhea. With that being said he does have an appointment with infectious disease tomorrow and his primary care on Wednesday. Again the reason for the infectious disease referral was due to the fact that he did not seem to be fully resolving with the use of oral antibiotics and therefore we were thinking that IV antibiotic therapy may be necessary secondary to the  fact that there was an intermediate finding for how effective the Cipro may be. Nonetheless again he has been having a lot of purulent and even green drainage. Fortunately right now that seems to have calmed down over the past week with the reinitiation of the oral antibiotic. Nonetheless we will see what Dr. Megan Salon has to say. 07/12/2019 upon evaluation today  patient appears to be doing about the same at this point in regard to his left lower extremity ulcer. Fortunately there is no signs of active infection at this time which is good news I do believe the Levaquin has been beneficial I did review Dr. Hale Bogus note and to be honest I agree that the patient's leg does appear to be doing better currently. What we found in the past as he does not seem to really completely resolve he will stop the antibiotic and then subsequently things will revert back to having issues with blue-green drainage, increased pain, and overall worsening in general. Obviously that is the reason I sent him back to infectious disease. 07/19/2019 upon evaluation today patient appears to be doing roughly the same in size there is really no dramatic improvement. He has started back on the Levaquin at this point and though he seems to be doing okay he did still have a lot of blue/green drainage noted on evaluation today unfortunately. I think that this is still indicative more likely of a Pseudomonas infection as previously noted and again he does see Dr. Megan Salon in just a couple of days. I do not know that were really able to effectively clear this with just oral antibiotics alone based on what I am seeing currently. Nonetheless we are still continue to try to manage as best we can with regard to the patient and his wound. I do think the wrap was helpful in decreasing the edema which is excellent news. No fevers, chills, nausea, vomiting, or diarrhea. 07/26/2019 upon evaluation today patient appears to be doing slightly better with  regard to the overall appearance of the muscle there is no dark discoloration centrally. Fortunately there is no signs of active infection at this time. No fevers, chills, nausea, vomiting, or diarrhea. Patient's wound bed currently the patient did have an appointment with Dr. Megan Salon at infectious disease last week. With that being said Dr. Megan Salon the patient states was still somewhat hesitant about put him on any IV antibiotics he wanted [place] to repeat cultures today and then see where things go going forward. He does look like Dr. Megan Salon because of some improvement the patient did have with the Levaquin wanted [place] to see about repeating cultures. If it indeed grows the Pseudomonas again then he recommended a possibility of considering a PICC line placement and IV antibiotic therapy. He plans to see the patient back in 1 to 2 weeks. 08/02/2019 upon evaluation today patient appears to be doing poorly with regard to his left lower extremity. We did get the results of his culture back it shows that he is still showing evidence of Pseudomonas which is consistent with the purulent/blue-green drainage that he has currently. Subsequently the culture also shows that he now is showing resistance to the oral fluoroquinolones which is unfortunate as that was really the only thing to treat the infection prior. I do believe that he is looking like this is going require IV antibiotic therapy to get this under control. Fortunately there is no signs of systemic infection at this time which is good news. The patient does see Dr. Megan Salon tomorrow. 08/09/2019 upon evaluation today patient appears to be doing better with regard to his left lower extremity ulcer in regard to the overall appearance. He is currently on IV antibiotic therapy. As ordered by Dr. Megan Salon. Currently the patient is on ceftazidime which she is going to take for the next 2 weeks and then follow-up for 4 to 5-week  appointment with Dr. Megan Salon.  The patient started this this past Friday symptoms have not for a total of 3 days currently in full. 08/16/2019 upon evaluation today patient's wound actually does show muscle in the base of the wound but in general does appear to be much better as far as the overall evidence of infection is concerned. In fact I feel like this is for the most part cleared up he still on the IV antibiotics he has not completed the full course yet but I think he is doing much better which is excellent news. 08/23/2019 upon evaluation today patient appears to be doing about the same with regard to his wound at this point. He tells me that he still has pain unfortunately. Fortunately there is no evidence of systemic infection at this time which is great news. There is significant muscle protrusion. 09/13/19 upon evaluation today patient appears to be doing about the same in regard to his leg unfortunately. He still has a lot of drainage coming from the ulceration there is still muscle exposed. With that being said the patient's last wound culture still showed an intermediate finding with regard to the Pseudomonas he still having the bluish/green drainage as well. Overall I do not know that the wound has completely cleared of infection at this point. Fortunately there is no signs of active infection systemically at this point which is good news. 09/20/2019 upon evaluation today patient's wound actually appears to be doing about the same based on what I am seeing currently. I do not see any signs of systemic infection he still does have evidence of some local infection and drainage. He did see Dr. Megan Salon last week and Dr. Megan Salon states that he probably does need a different IV antibiotic although he does not want to put him on this until the patient begins the Remicade infusion which is actually scheduled for about 10 days out from today on 13 May. Following that time Dr. Megan Salon is good to see him back and then will  evaluate the feasibility of starting him on the IV antibiotic therapy once again at that point. I do not disagree with this plan I do believe as Dr. Megan Salon stated in his note that I reviewed today that the patient's issue is multifactorial with the pyoderma being 1 aspect of this that were hoping the Remicade will be helpful for her. In the meantime I think the gentamicin is, helping to keep things under decent okay control in regard to the ulcer. 09/27/2019 upon evaluation today patient appears to be doing about the same with regard to his wound still there is a lot of muscle exposure though he does have some hyper granulation tissue noted around the edge and actually some granulation tissue starting to form over the muscle which is actually good news. Fortunately there is no evidence of active infection which is also good news. His pain is less at this point. 5/21; this is a patient I have not seen in a long time. 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 He has a large wound on the left lateral leg with protruding muscle. He comes in the clinic today showing the same area on his left medial ankle. He says there is been a spot there for some time although we have not previously defined this. Today he has a clearly defined area with slight amount of skin breakdown surrounded by raised areas with a purplish hue in color. This is not painful he  says it is irritated. 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 He still has muscle protruding from the lateral portion of his left leg but fortunately the new area that they were concerned about at his last visit does not appear to have opened at this point. He is currently on Remicade infusions and seems to be doing better in my opinion in fact the wound itself seems to be overall much better. The purplish  discoloration that he did have seems to have resolved and I think that is a good sign that hopefully the Remicade is doing its job. He does have some biofilm noted over the surface of the wound. 11/01/2019 on evaluation today patient's wound actually appears to be doing excellent at this time. Fortunately there is no evidence of active infection and overall I feel like he is making great progress. The Remicade seems to be due excellent job in my opinion. [name], [name] ([id number]) 11/08/19 evaluation today vision actually appears to be doing quite well with regard to his weight ulcer. He's been tolerating dressing changes without complication. Fortunately there is no evidence of infection. No fevers, chills, nausea, or vomiting noted at this time. Overall states that is having more itching than pain which is actually a good sign in my opinion. 12/13/2019 upon evaluation today patient appears to be doing well today with regard to his wound. He has been tolerating the dressing changes without complication. Fortunately there is no sign of active infection at this time. No fevers, chills, nausea, vomiting, or diarrhea. Overall I feel like the infusion therapy has been very beneficial for him. 01/06/2020 on evaluation today patient appears to be doing well with regard to his wound. This is measuring smaller and actually looks to be doing better. Fortunately there is no signs of active infection at this point. No fevers, chills, nausea, vomiting, or diarrhea. With that being said he does still have the blue-green drainage but this does not seem to be causing any significant issues currently. He has been using the gentamicin that does seem to be keeping things under decent control at this point. He goes later this morning for his next infusion therapy for the pyoderma which seems to also be very beneficial. 02/07/2020 on evaluation today patient appears to be doing about the same in regard to his wounds  currently. Fortunately there is no signs of active infection systemically he does still have evidence of local infection still using gentamicin. He also is showing some signs of improvement albeit slowly I do feel like we are making some progress here. 02/21/2020 upon evaluation today patient appears to be making some signs of improvement the wound is measuring a little bit smaller which is great news and overall I am very pleased with where he stands currently. He is going to be having infusion therapy treatment on the 15th of this month. Fortunately there is no signs of active infection at this time. 03/13/2020 I do believe patient's wound is actually showing some signs of improvement here which is great news. He has continue with the infusion therapy through rheumatology/dermatology at [facility]. That does seem to be beneficial. I still think he gets as much benefit from this as he did from the prednisone initially but nonetheless obviously this is less harsh on his body that the prednisone as far as they are concerned. 03/31/2020 on evaluation today patient's wound actually showing signs of some pretty  good improvement in regard to the overall appearance of the wound bed. There is still muscle exposed though he does have some epithelial growth around the edges of the wound. Fortunately there is no signs of active infection at this time. No fevers, chills, nausea, vomiting, or diarrhea. 04/24/2020 upon evaluation today patient appears to be doing about the same in regard to his leg ulcer. He has been tolerating the dressing changes without complication. Fortunately there is no signs of active infection at this time. No fevers, chills, nausea, vomiting, or diarrhea. With that being said he still has a lot of irritation from the bandaging around the edges of the wound. We did discuss today the possibility of a referral to plastic surgery. 05/22/2020 on evaluation today patient appears to be doing well with  regard to his wounds all things considered. He has not been able to get the Chantix apparently there is a recall nurse that I was unaware of put out by [employer] involuntarily. Nonetheless for now I am and I have to do some research into what may be the best option for him to help with quitting in regard to smoking and we discussed that today. 06/26/2020 upon evaluation today patient appears to be doing well with regard to his wound from the standpoint of infection I do not see any signs of infection at this point. With that being said unfortunately he is still continuing to have issues with muscle exposure and again he is not having a whole lot of new skin growth unfortunately. There does not appear to be any signs of active infection at this time. No fevers, chills, nausea, vomiting, or diarrhea. 07/10/2020 upon evaluation today patient appears to be doing a little bit more poorly currently compared to where he was previous. I am concerned currently about an active infection that may be getting worse especially in light of the increased size and tenderness of the wound bed. No fevers, chills, nausea, vomiting, or diarrhea. 07/24/2020 upon evaluation today patient appears to be doing poorly in regard to his leg ulcer. He has been tolerating the dressing changes without complication but unfortunately is having a lot of discomfort. 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 Subsequently I think he is going require possibly IV antibiotics to get this under control. I am very concerned about the severity of his infection and the amount of discomfort he is having. 07/31/2020 upon evaluation today patient appears to be doing about the same in regard to his leg wound. He did see Dr. Megan Salon and Dr. Megan Salon is actually going to start him on IV antibiotics. He goes for the PICC line tomorrow. With that being said there do not have that run for  2 weeks and then see how things are doing and depending on how he is progressing they may extend that a little longer. Nonetheless I am glad this is getting ready to be in place and definitely feel it may help the patient. In the meantime is been using mainly triamcinolone to the wound bed has an anti-inflammatory. 08/07/2020 on evaluation today patient appears to be doing well with regard to his wound compared even last week. In the interim he has gotten the PICC line placed and overall this seems to be doing excellent. There does not appear to be any evidence of infection which is great news systemically although locally of course has had the infection this appears to be improving with the use of  the antibiotics. 08/14/2020 upon evaluation today patient's wound actually showing signs of excellent improvement. Overall the irritation has significantly improved the drainage is back down to more of a normal level and his pain is really pretty much nonexistent compared to what it was. Obviously I think that this is significantly improved secondary to the IV antibiotic therapy which has made all the difference in the world. Again he had a resistant form of Pseudomonas for which oral antibiotics just was not cutting it. Nonetheless I do think that still we need to consider the possibility of a surgical closure for this wound is been open so long and to be honest with muscle exposed I think this can be very hard to get this to close outside of this although definitely were still working to try to do what we can in that regard. 08/21/2020 upon evaluation today patient appears to be doing very well with regard to his wounds on the left lateral lower extremity/calf area. Fortunately there does not appear to be signs of active infection which is great news and overall very pleased with where things stand today. He is actually wrapping up his treatment with IV antibiotics tomorrow. After that we will see where  things go from there. Electronic Signature(s) Signed: 08/21/2020 10:45:02 AM By: Worthy Keeler PA-C Entered By: Worthy Keeler on 08/21/2020 10:45:02 Fullbright, Benigno ([id number]) JAVIEL, CANEPA ([id number]) -------------------------------------------------------------------------------- Physical Exam Details Patient Name: [name] Date of Service: 08/21/2020 9:15 AM Medical Record Number: [id number] Patient Account Number: [id number] Date of Birth/Sex: 1980-12-24 (39 y.o. M) Treating RN: Carlene Coria Primary Care Provider: [name] Other Clinician: Jeanine Luz Referring Provider: [name] Treating Provider/Extender: [name] Cliche in Treatment: 64 Constitutional Obese and well-hydrated in no acute distress. Respiratory normal breathing without difficulty. Psychiatric this patient is able to make decisions and demonstrates good insight into disease process. Alert and Oriented x 3. pleasant and cooperative. Notes Upon inspection patient's wound bed actually showed signs of some good epithelization around the edges of the wound the granulation was good he still has muscle exposed however he does have an appointment set around the middle of May with Dr. Claudia Desanctis who is a plastic surgeon locally to discuss surgical options as far as healing is concerned. Electronic Signature(s) Signed: 08/21/2020 2:42:26 PM By: Worthy Keeler PA-C Entered By: Worthy Keeler on 08/21/2020 14:42:26 [name], [name] ([id number]) -------------------------------------------------------------------------------- Physician Orders Details Patient Name: [name] Date of Service: 08/21/2020 9:15 AM Medical Record Number: [id number] Patient Account Number: [id number] Date of Birth/Sex: 1980/06/30 (39 y.o. 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 Edema Control - Lymphedema / Segmental Compressive Device / Other o Tubigrip double layer applied - F Wound Treatment Wound #1 - Lower Leg Wound Laterality: Left, Lateral Cleanser: Soap and Water 1 x Per Day/30 Days Discharge Instructions: Gently cleanse wound with antibacterial soap, rinse and pat dry prior to dressing wounds Topical: Triamcinolone Acetonide Cream, 0.1%, 15 (g) tube 1 x Per Day/30 Days Discharge Instructions: to wound bed ,  lightly Secondary Dressing: Mepilex Border Flex, 6x6 (in/in) 1 x Per Day/30 Days Discharge Instructions: Apply to wound as directed. Do not cut. Electronic Signature(s) Signed: 08/21/2020 5:03:26 PM By: Worthy Keeler PA-C Signed: 08/25/2020 8:10:37 AM By: Carlene Coria RN Entered By: Carlene Coria on 08/21/2020 09:48:28 [name], [name] ([id number]) -------------------------------------------------------------------------------- Problem List Details Patient Name: [name] Date of Service: 08/21/2020 9:15 AM Medical Record Number: [id number] Patient Account Number: [id number] Date of Birth/Sex: [birth year] (40 y.o. M) Treating RN: Carlene Coria Primary Care Provider: [name] Other Clinician: Jeanine Luz Referring Provider: [name] Treating Provider/Extender: [name] Cliche in Treatment: [phone] Active Problems ICD-10 Encounter Code Description Active Date MDM Diagnosis L97.222 Non-pressure chronic ulcer of left calf with fat layer exposed  12/04/2016 No Yes L88 Pyoderma gangrenosum 03/26/2017 No Yes L97.321 Non-pressure chronic ulcer of left ankle limited to breakdown of skin 10/08/2019 No Yes I87.2 Venous insufficiency (chronic) (peripheral) 12/04/2016 No Yes L03.116 Cellulitis of left lower limb 05/24/2019 No Yes F17.208 Nicotine dependence, unspecified, with other nicotine-induced disorders 04/24/2020 No Yes Inactive Problems ICD-10 Code Description Active Date Inactive Date L97.213 Non-pressure chronic ulcer of right calf with necrosis of muscle 04/02/2017 04/02/2017 Resolved Problems Electronic Signature(s) Signed: 08/21/2020 9:46:35 AM By: Worthy Keeler PA-C Entered By: Worthy Keeler on 08/21/2020 09:46:35 [name], [name] ([id number]) -------------------------------------------------------------------------------- Progress Note Details Patient Name: [name] Date of Service: 08/21/2020 9:15 AM Medical Record Number: [id number] Patient Account Number: [id number] Date of Birth/Sex: Nov 23, 1980 (39 y.o. M) Treating RN: Carlene Coria Primary Care Provider: [name] Other Clinician: Jeanine Luz Referring Provider: [name] Treating Provider/Extender: [name] Cliche in Treatment: 193 Subjective Chief Complaint Information obtained from Patient He is here in follow up evaluation for LLE pyoderma ulcer History of Present Illness (HPI) 12/04/16; 40 year old man who comes into the clinic today for review of a wound on the posterior left calf. He tells me that is been there for about a year. He is not a diabetic he does smoke half a pack per day. He was seen in the ER on 11/20/16 felt to have cellulitis around the wound and was given clindamycin. An x-ray did not show osteomyelitis. The patient initially tells me that he has a milk allergy that sets off a pruritic itching rash on his lower legs which she scratches incessantly and he thinks that's what may have set up the wound. He has been using various  topical antibiotics and ointments without any effect. He works in a [employer]ing [employer] and is on his feet all day. He does not have a prior history of wounds however he does have the rash on both lower legs the right arm and the ventral aspect of his left arm. These are excoriations and clearly have had scratching however there are of macular looking areas on both legs including a substantial larger area on the right leg. This does not have an underlying open area. There is no blistering. The patient tells me that 2 years ago in [place] in response to the rash on his legs he saw a dermatologist who told him he had a condition which may be pyoderma gangrenosum although I may be putting words into his mouth. He seemed to recognize this. 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 He is not in any treatment for this. He's had no recent travel 12/11/16; the patient arrives today with his wound and roughly the same condition we've been using silver alginate this is a deep  punched out wound with some surrounding erythema but no tenderness. Biopsy I did did not show confirmed pyoderma gangrenosum suggested nonspecific inflammation and vasculitis but does not provide an actual description of what was seen by the pathologist. I'm really not able to understand this We have also received information from the patient's dermatologist in [place] notes from April 2016. This was a doctor Agarwal-antal. 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 He was prescribed topical steroid high potency under occlusion which helped but at this point the patient did not have a deep punched out wound. 12/18/16; the patient's wound is larger in terms of surface area however this surface looks better and there is less depth. The surrounding erythema also is better. The patient states that the wrap we put on came off 2 days ago when he has been using his compression stockings. He we are  in the process of getting a dermatology consult. 12/26/16 on evaluation today patient's left lower extremity wound shows evidence of infection with surrounding erythema noted. He has been tolerating the dressing changes but states that he has noted more discomfort. There is a larger area of erythema surrounding the wound. No fevers, chills, nausea, or vomiting noted at this time. With that being said the wound still does have slough covering the surface. He is not allergic to any medication that he is aware of at this point. In regard to his right lower extremity he had several regions that are erythematous and pruritic he wonders if there's anything we can do to help that. 01/02/17 I reviewed patient's wound culture which was obtained his visit last week. He was placed on doxycycline at that point. Unfortunately that does not appear to be an antibiotic that would likely help with the situation however the pseudomonas noted on culture is sensitive to Cipro. Also unfortunately patient's wound seems to have a large compared to last week's evaluation. Not severely so but there are definitely increased measurements in general. He is continuing to have discomfort as well he writes this to be a seven out of 10. In fact he would prefer me not to perform any debridement today due to the fact that he is having discomfort and considering he has an active infection on the little reluctant to do so anyway. No fevers, chills, nausea, or vomiting noted at this time. 01/08/17; patient seems dermatology on September 5. I suspect dermatology will want the slides from the biopsy I did sent to their pathologist. I'm not sure if there is a way we can expedite that. In any case the culture I did before I left on vacation 3 weeks ago showed Pseudomonas he was given 10 days of Cipro and per her description of her intake nurses is actually somewhat better this week although the wound is quite a bit bigger than I remember the  last time I saw this. He still has 3 more days of Cipro 01/21/17; dermatology appointment tomorrow. He has completed the ciprofloxacin for Pseudomonas. Surface of the wound looks better however he is had some deterioration in the lesions on his right leg. Meantime the left lateral leg wound we will continue with sample 01/29/17; patient had his dermatology appointment but I can't yet see that note. He is completed his antibiotics. The wound is more superficial but considerably larger in circumferential area than when he came in. This is in his left lateral calf. He also has swollen erythematous areas with superficial wounds on the right leg and small papular  areas on both arms. There apparently areas in her his upper thighs and buttocks I did not look at those. Dermatology biopsied the right leg. Hopefully will have their input next week. 02/05/17; patient went back to see his dermatologist who told him that he had a "scratching problem" as well as staph. He is now on a 30 day course of doxycycline and I believe she gave him triamcinolone cream to the right leg areas to help with the itching [not exactly sure but probably triamcinolone]. She apparently looked at the left lateral leg wound although this was not rebiopsied and I think felt to be ultimately part of the same pathogenesis. He is using sample border foam and changing nevus himself. He now has a new open area on the right posterior leg which was his biopsy site I don't have any of the dermatology notes 02/12/17; we put the patient in compression last week with SANTYL to the wound on the left leg and the biopsy. Edema is much better and the depth of the wound is now at level of skin. Area is still the same Biopsy site on the right lateral leg we've also been using santyl with a border foam dressing and he is changing this himself. 02/19/17; Using silver alginate started last week to both the substantial left leg wound and the biopsy site on the  right wound. He is tolerating compression well. Has a an appointment with his primary M.D. tomorrow wondering about diuretics although I'm wondering if the edema problem is actually lymphedema [name], [name] ([id number]) 02/26/17; the patient has been to see his primary doctor Dr. Jerrel Ivory at [facility] our primary care. She started him on Lasix 20 mg and this seems to have helped with the edema. However we are not making substantial change with the left lateral calf wound and inflammation. The biopsy site on the right leg also looks stable but not really all that different. 03/12/17; the patient has been to see vein and vascular Dr. Lucky Cowboy. He has had venous reflux studies I have not reviewed these. I did get a call from his dermatology office. 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 They therefore recommended clobetasol and tetracycline i.e. 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 03/26/17; apparently the patient just had reflux studies not an appointment with Dr. dew. She arrives in clinic today having applied clobetasol for 2-3 weeks. He notes over the last 2-3 days excessive drainage having to change the dressing 3-4 times a day and also expanding erythema. 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 Lillia Carmel at [facility] dermatology locally who kindly saw him at my request. 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 My concern was the degree of erythema medially extending from his left leg wound which was either inflammation from pyoderma or cellulitis. I put him on Augmentin however culture of the wound showed Pseudomonas which is quinolone sensitive. I really don't  believe he has cellulitis however in view of everything I will continue and give him a course of Cipro. He is also on doxycycline as an immune modulator for the pyoderma. In addition to his original wound on the left lateral leg with surrounding erythema he has a wound on the right posterior calf which was an original biopsy site done by dermatology. 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 Saw Dr. Lillia Carmel yesterday. He has been using topical antibiotics to both wound areas his original wound on the left and the biopsies/pathergy area on the right. There is definitely some improvement in the inflammation around the wound on the right although the patient states he has increasing sensitivity of the wounds. He is on prednisone 60 and doxycycline 1 as prescribed by Dr. Lillia Carmel. He is covering the topical antibiotic with gauze and putting this in his own compression stocks and changing this daily. He states that Dr. Lottie Rater did a culture of the left leg wound yesterday 05/07/17; pyoderma gangrenosum. The patient saw Dr. Lillia Carmel yesterday and has a follow-up with her in one month. He is still using topical antibiotics to both wounds although he can't recall exactly what type. He is still on prednisone 60 mg. Dr. Lillia Carmel stated that the doxycycline could stop if we were in agreement. 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 The right medial leg was induced by biopsy/pathergy. The area on the right is essentially healed. 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 The right medial leg has closed and remains closed. He is still on prednisone 60. He tells me he missed his last dermatology appointment with Dr. Lillia Carmel but will make another appointment. He reports that her blood sugar at a recent screen in [place] was high 200's. He was 180 today. 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 I think he is going to require still much longer prednisone perhaps another 3 months before attempting to taper. In the meantime his wound is a lot better. Smaller. He is cleaning this off daily and applying topical antibiotics. When he was last in the clinic I thought about changing to [facility] and actually put in a couple of calls to dermatology although probably not during their business hours. 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 He continues on oral prednisone. He has been using triple antibiotic ointment. There is surface debris and we will transition to [facility] and have him return in 2 weeks. He has lost 30 pounds since his last appointment with lifestyle modification. He may benefit from topical steroid cream for treatment this can be considered at a later date. 08/22/17 on evaluation today patient appears to actually be doing rather well in regard to his left lateral lower extremity ulcer. He has actually been managed by Dr. Dellia Nims most recently. Patient is currently on oral steroids at this time. This seems to have been of benefit for him. Nonetheless his last visit was actually with Leah on 08/06/17. Currently he is not utilizing any topical steroid creams although this could be of benefit as well. No fevers, chills, nausea, or vomiting noted at this time. 09/05/17 on evaluation today  patient appears to be doing better in regard to his left lateral  lower extremity ulcer. He has been tolerating the dressing changes without complication. He is using Santyl with good effect. Overall I'm very pleased with how things are standing at this point. Patient likewise is happy that this is doing better. 09/19/17 on evaluation today patient actually appears to be doing rather well in regard to his left lateral lower extremity ulcer. 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 He's not having any significant pain. 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 He has been tolerating the Santyl without complication and in general I feel like he's making good progress. 10/17/17 on evaluation today patient appears to be doing very well in regard to his left lateral lower surety ulcer. He has been tolerating the dressing changes without complication. There does not appear to be any evidence of infection he's alternating the Santyl and the triple antibiotic ointment every other day this seems to be doing well for him. 11/03/17 on evaluation today patient appears to be doing very well in regard to his left lateral lower extremity ulcer. He is been tolerating the dressing changes without complication which is good news. Fortunately there does not appear to be any evidence of infection which is also great news. Overall is doing excellent they are starting to taper down on the prednisone is down to 40 mg at this point it also started topical clobetasol for him. 11/17/17 on evaluation today patient appears to be doing well in regard to his left lateral lower surety ulcer. He's been tolerating the dressing changes without complication. He does note that he is having no pain, no excessive drainage or discharge, and overall he feels like things are going about how he would expect and hope they  would. Overall he seems to have no evidence of infection at this time in my opinion which is good news. 12/04/17-He is seen in follow-up evaluation for right lateral lower extremity ulcer. He has been applying topical steroid cream. Today's measurement show slight increase in size. Over the next 2 weeks we will transition to every other day Santyl and steroid cream. He has been encouraged to monitor for changes and notify clinic with any concerns 12/15/17 on evaluation today patient's left lateral motion the ulcer and fortunately is doing worse again at this point. This just since last week to this week has close to doubled in size according to the patient. I did not seeing last week's I do not have a visual to compare this to in our system was also down so we do not have all the charts and at this point. Nonetheless it does have me somewhat concerned in regard to the fact that again he was worried enough about it he has contact the dermatology that placed them back on the full strength, 50 mg a day of the prednisone that he was taken previous. He continues to alternate using clobetasol along with Santyl at this point. He is obviously somewhat frustrated. [name], [name] ([id number]) 12/22/17 on evaluation today patient appears to be doing a little worse compared to last evaluation. Unfortunately the wound is a little deeper and slightly larger than the last week's evaluation. With that being said he has made some progress in regard to the irritation surrounding at this time unfortunately despite  that progress that's been made he still has a significant issue going on here. 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 For that reason I discussed with him today the possibility of perform a wound culture to ensure there's nothing overtly infected. 01/06/18 on  evaluation today patient's wound is larger and deeper than previously evaluated. With that being said it did appear that his wound was infected after my last evaluation with him. Subsequently I did end up prescribing a prescription for Bactrim DS which she has been taking and having no complication with. Fortunately there does not appear to be any evidence of infection at this point in time as far as anything spreading, no want to touch, and overall I feel like things are showing signs of improvement. 01/13/18 on evaluation today patient appears to be even a little larger and deeper than last time. There still muscle exposed in the base of the wound. Nonetheless he does appear to be less erythematous I do believe inflammation is calming down also believe the infection looks like it's probably resolved at this time based on what I'm seeing. No fevers, chills, nausea, or vomiting noted at this time. 01/30/18 on evaluation today patient actually appears to visually look better for the most part. Unfortunately those visually this looks better he does seem to potentially have what may be an abscess in the muscle that has been noted in the central portion of the wound. This is the first time that I have noted what appears to be fluctuance in the central portion of the muscle. With that being said I'm somewhat more concerned about the fact that this might indicate an abscess formation at this location. I do believe that an ultrasound would be appropriate. This is likely something we need to try to do as soon as possible. He has been switch to mupirocin ointment and he is no longer using the steroid ointment as prescribed by dermatology he sees them again next week he's been decreased from 60 to 40 mg of prednisone. 03/09/18 on evaluation today patient actually appears to be doing a little better compared to last time I saw him. There's not as much erythema surrounding the wound itself. He I did review his most  recent infectious disease note which was dated 02/24/18. He saw Dr. Michel Bickers in [place]. 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 Patient is now off of antibiotics and they are continually observing this. There seems to be no change in the past two weeks in my pinion based on what the patient says and what I see today compared to what Dr. Megan Salon likely saw two weeks ago. No fevers, chills, nausea, or vomiting noted at this time. 03/23/18 on evaluation today patient's wound actually appears to be showing signs of improvement which is good news. He is currently still on the Dapsone. He is also working on tapering the prednisone to get off of this and Dr. Lottie Rater is working with him in this regard. Nonetheless overall I feel like the wound is doing well it does appear based on the infectious disease note that I reviewed from Dr. 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 I did also review the results of his ultrasound of left lower extremity which revealed there was a dentist tissue in the base of the wound without an abscess noted. 04/06/18 on evaluation today the patient's left lateral lower extremity ulcer actually appears to be doing fairly well which is excellent news. There does not appear to be any evidence of infection at this time which is also great news. Overall he still does have a significantly large ulceration although little by little he seems to be making progress. He is down to 10 mg a day of the prednisone. 04/20/18 on evaluation today patient actually appears to be doing excellent at this time in regard to his left lower extremity ulcer. He's making signs of good progress unfortunately this is taking much longer than we would really like to see but nonetheless he is making  progress. Fortunately there does not appear to be any evidence of infection at this time. No fevers, chills, nausea, or vomiting noted at this time. The patient has not been using the Santyl due to the cost he hadn't got in this field yet. He's mainly been using the antibiotic ointment topically. Subsequently he also tells me that he really has not been scrubbing in the shower I think this would be helpful again as I told him it doesn't have to be anything too aggressive to even make it believe just enough to keep it free of some of the loose slough and biofilm on the wound surface. 05/11/18 on evaluation today patient's wound appears to be making slow but sure progress in regard to the left lateral lower extremity ulcer. He is been tolerating the dressing changes without complication. Fortunately there does not appear to be any evidence of infection at this time. He is still just using triple antibiotic ointment along with clobetasol occasionally over the area. He never got the Santyl and really does not seem to intend to in my pinion. 06/01/18 on evaluation today patient appears to be doing a little better in regard to his left lateral lower extremity ulcer. He states that overall he does not feel like he is doing as well with the Dapsone as he did with the prednisone. Nonetheless he sees his dermatologist later today and is gonna talk to them about the possibility of going back on the prednisone. Overall again I believe that the wound would be better if you would utilize Santyl but he really does not seem to be interested in going back to the [place] at this point. He has been using triple antibiotic ointment. 06/15/18 on evaluation today patient's wound actually appears to be doing about the same at this point. Fortunately there is no signs of infection at this time. He has made slight improvements although he continues to not really want to clean the wound bed at this point. He states that he just  doesn't mess with it he doesn't want to cause any problems with everything else he has going on. He has been on medication, antibiotics as prescribed by his dermatologist, for a staff infection of his lower extremities which is really drying out now and looking much better he tells me. Fortunately there is no sign of overall infection. 06/29/18 on evaluation today patient appears to be doing well in regard to his left lateral lower surety ulcer all things considering. Fortunately his staff infection seems to be greatly improved compared to previous. He has no signs of infection and this is drying up quite nicely.  He is still the doxycycline for this is no longer on cental, Dapsone, or any of the other medications. His dermatologist has recommended possibility of an infusion but right now he does not want to proceed with that. 07/13/18 on evaluation today patient appears to be doing about the same in regard to his left lateral lower surety ulcer. Fortunately there's no signs of infection at this time which is great news. Unfortunately he still builds up a significant amount of Slough/biofilm of the surface of the wound he still is not really cleaning this as he should be appropriately. Again I'm able to easily with saline and gauze remove the majority of this on the surface which if you would do this at home would likely be a dramatic improvement for him as far as getting the area to improve. Nonetheless overall I still feel like he is making progress is just very slow. I think Santyl will be of benefit for him as well. Still he has not gotten this as of this point. 07/27/18 on evaluation today patient actually appears to be doing little worse in regards of the erythema around the periwound region of the wound he also tells me that he's been having more drainage currently compared to what he was experiencing last time I saw him. He states not quite as bad as what he had because this was infected previously  but nonetheless is still appears to be doing poorly. Fortunately there is no evidence of systemic infection at this point. The patient tells me that he is not going to be able to afford the Santyl. He is still waiting to hear about the infusion therapy with his dermatologist. Apparently she wants an updated colonoscopy first. [name], [name] ([id number]) 08/10/18 on evaluation today patient appears to be doing better in regard to his left lateral lower extremity ulcer. Fortunately he is showing signs of improvement in this regard he's actually been approved for Remicade infusion's as well although this has not been scheduled as of yet. Fortunately there's no signs of active infection at this time in regard to the wound although he is having some issues with infection of the right lower extremity is been seen as dermatologist for this. Fortunately they are definitely still working with him trying to keep things under control. 09/07/18 on evaluation today patient is actually doing rather well in regard to his left lateral lower extremity ulcer. He notes these actually having some hair grow back on his extremity which is something he has not seen in years. He also tells me that the pain is really not giving them any trouble at this time which is also good news overall she is very pleased with the progress he's using a combination of the mupirocin along with the probate is all mixed. 09/21/18 on evaluation today patient actually appears to be doing fairly well all things considered in regard to his looks from the ulcer. He's been tolerating the dressing changes without complication. Fortunately there's no signs of active infection at this time which is good news he is still on all antibiotics or prevention of the staff infection. He has been on prednisone for time although he states it is gonna contact his dermatologist and see if she put them on a short course due to some irritation that he has going on  currently. Fortunately there's no evidence of any overall worsening this is going very slow I think cental would be something that would be helpful for him although he states that $  50 for tube is quite expensive. He therefore is not willing to get that at this point. 10/06/18 on evaluation today patient actually appears to be doing decently well in regard to his left lateral leg ulcer. He's been tolerating the dressing changes without complication. Fortunately there's no signs of active infection at this time. Overall I'm actually rather pleased with the progress he's making although it's slow he doesn't show any signs of infection and he does seem to be making some improvement. I do believe that he may need a switch up and dressings to try to help this to heal more appropriately and quickly. 10/19/18 on evaluation today patient actually appears to be doing better in regard to his left lateral lower extremity ulcer. This is shown signs of having much less Slough buildup at this point due to the fact he has been using the [employer]. Obviously this is very good news. The overall size of the wound is not dramatically smaller but again the appearance is. 11/02/18 on evaluation today patient actually appears to be doing quite well in regard to his lower [place] ulcer. A lot of the skin around the ulcer is actually somewhat irritating at this point this seems to be more due to the dressing causing irritation from the adhesive that anything else. Fortunately there is no signs of active infection at this time. 11/24/18 on evaluation today patient appears to be doing a little worse in regard to his overall appearance of his lower extremity ulcer. There's more erythema and warmth around the wound unfortunately. He is currently on doxycycline which he has been on for some time. With that being said I'm not sure that seems to be helping with what appears to possibly be an acute cellulitis with regard to his left lower  extremity ulcer. No fevers, chills, nausea, or vomiting noted at this time. 12/08/18 on evaluation today patient's wounds actually appears to be doing significantly better compared to his last evaluation. He has been using Santyl along with alternating tripling about appointment as well as the steroid cream seems to be doing quite well and the wound is showing signs of improvement which is excellent news. Fortunately there's no evidence of infection and in fact his culture came back negative with only normal skin flora noted. 12/21/2018 upon evaluation today patient actually appears to be doing excellent with regard to his ulcer. This is actually the best that I have seen it since have been helping to take care of him. It is both smaller as well as less slough noted on the surface of the wound and seems to be showing signs of good improvement with new skin growing from the edges. He has been using just the triamcinolone he does wonder if he can get a refill of that ointment today. 01/04/2019 upon evaluation today patient actually appears to be doing well with regard to his left lateral lower extremity ulcer. With that being said it does not appear to be that he is doing quite as well as last time as far as progression is concerned. There does not appear to be any signs of infection or significant irritation which is good news. With that being said I do believe that he may benefit from switching to a collagen based dressing based on how clean The wound appears. 01/18/2019 on evaluation today patient actually appears to be doing well with regard to his wound on the left lower extremity. He is not made a lot of progress compared to where we were  previous but nonetheless does seem to be doing okay at this time which is good news. There is no signs of active infection which is also good news. My only concern currently is I do wish we can get him into utilizing the collagen dressing his insurance would not pay  for the supplies that we ordered although it appears that he may be able to order this through his supply company that he typically utilizes. This is [employer]. Nonetheless he did try to order it during the office visit today and it appears this did go through. We will see if he can get that it is a different brand but nonetheless he has collagen and I do think will be beneficial. 02/01/2019 on evaluation today patient actually appears to be doing a little worse today in regard to the overall size of his wounds. Fortunately there is no signs of active infection at this time. That is visually. Nonetheless when this is happened before it was due to infection. For that reason were somewhat concerned about that this time as well. 02/08/2019 on evaluation today patient unfortunately appears to be doing slightly worse with regard to his wound upon evaluation today. Is measuring a little deeper and a little larger unfortunately. I am not really sure exactly what is causing this to enlarge he actually did see his dermatologist she is going to see about initiating Humira for him. Subsequently she also did do steroid injections into the wound itself in the periphery. Nonetheless still nonetheless he seems to be getting a little bit larger he is gone back to just using the steroid cream topically which I think is appropriate. I would say hold off on the collagen for the time being is definitely a good thing to do. Based on the culture results which we finally did get the final result back regarding it shows staph as the bacteria noted again that can be a normal skin bacteria based on the fact however he is having increased drainage and worsening of the wound measurement wise I would go ahead and place him on an antibiotic today I do believe for this. 02/15/2019 on evaluation today patient actually appears to be doing somewhat better in regard to his ulcer. There is no signs of worsening at this time I did review his  culture results which showed evidence of Staphylococcus aureus but not MRSA. Again this could just be more related to the normal skin bacteria although he states the drainage has slowed down quite a bit he may have had a mild infection not just colonization. And was much smaller and then since around10/04/2019 on evaluation today patient appears to be doing unfortunately worse as far as the size of the wound. I really feel like that this is steadily getting larger again it had been doing excellent right at the beginning of September we have seen a steady increase in the area of the wound it is almost 2-1/2 times the size it was on September 1. Obviously this is a bad trend this is not wanting to see. For that reason we went back to using just the topical triamcinolone cream which does seem to help with inflammation. I checked him for bacteria by way of culture and nothing showed positive there. I am considering giving him a short course of a tapering steroid Euel Castile, Bevan ([id number]) today to see if that is can be beneficial for him. The patient is in agreement with giving that a try. 03/08/2019 on evaluation today patient  appears to be doing very well in comparison to last evaluation with regard to his lower extremity ulcer. This is showing signs of less inflammation and actually measuring slightly smaller compared to last time every other week over the past month and a half he has been measuring larger larger larger. Nonetheless I do believe that the issue has been inflammation the prednisone does seem to have been beneficial for him which is good news. No fevers, chills, nausea, vomiting, or diarrhea. 03/22/2019 on evaluation today patient appears to be doing about the same with regard to his leg ulcer. He has been tolerating the dressing changes without complication. With that being said the wound seems to be mostly arrested at its current size but really is not making any progress except  for when we prescribed the prednisone. He did show some signs of dropping as far as the overall size of the wound during that interval week. Nonetheless this is something he is not on long-term at this point and unfortunately I think he is getting need either this or else the Humira which his dermatologist has discussed try to get approval for. With that being said he will be seeing his dermatologist on the 11th of this month that is November. 04/19/2019 on evaluation today patient appears to be doing really about the same the wound is measuring slightly larger compared to last time I saw him. He has not been into the office since November 2 due to the fact that he unfortunately had Covid as that his entire family. He tells me that it was rough but they did pull-through and he seems to be doing much better. Fortunately there is no signs of active infection at this time. No fevers, chills, nausea, vomiting, or diarrhea. 05/10/2019 on evaluation today patient unfortunately appears to be doing significantly worse as compared to last time I saw him. He does tell me that he has had his first dose of Humira and actually is scheduled to get the next one in the upcoming week. With that being said he tells me also that in the past several days he has been having a lot of issues with green drainage she showed me a picture this is more blue-green in color. He is also been having issues with increased sloughy buildup and the wound does appear to be larger today. Obviously this is not the direction that we want everything to take based on the starting of his Humira. Nonetheless I think this is definitely a result of likely infection and to be honest I think this is probably Pseudomonas causing the infection based on what I am seeing. 05/24/2019 on evaluation today patient unfortunately appears to be doing significantly worse compared to his prior evaluation with me 2 weeks ago. I did review his culture results which  showed that he does have Staph aureus as well as Pseudomonas noted on the culture. Nonetheless the Levaquin that I prescribed for him does not appear to have been appropriate and in fact he tells me he is no longer experiencing the green drainage and discharge that he had at the last visit. Fortunately there is no signs of active infection at this time which is good news although the wound has significantly worsened it in fact is much deeper than it was previous. We have been utilizing up to this point triamcinolone ointment as the prescription topical of choice but at this time I really feel like that the wound is getting need to be packed in order  to appropriately manage this due to the deeper nature of the wound. Therefore something along the lines of an alginate dressing may be more appropriate. 05/31/2019 upon inspection today patient's wound actually showed signs of doing poorly at this point. Unfortunately he just does not seem to be making any good progress despite what we have tried. He actually did go ahead and pick up the Cipro and start taking that as he was noticing more green drainage he had previously completed the Levaquin that I prescribed for him as well. Nonetheless he missed his appointment for the seventh last week on Wednesday with the wound care center and [facility] where his dermatologist referred him. Obviously I do think a second opinion would be helpful at this point especially in light of the fact that the patient seems to be doing so poorly despite the fact that we have tried everything that I really know how at this point. The only thing that ever seems to have helped him in the past is when he was on high doses of continual steroids that did seem to make a difference for him. Right now he is on immune modulating medication to try to help with the pyoderma but I am not sure that he is getting as much relief at this point as he is previously obtained from the use of  steroids. 06/07/2019 upon evaluation today patient unfortunately appears to be doing worse yet again with regard to his wound. In fact I am starting to question whether or not he may have a fluid pocket in the muscle at this point based on the bulging and the soft appearance to the central portion of the muscle area. There is not anything draining from the muscle itself at this time which is good news but nonetheless the wound is expanding. I am not really seeing any results of the Humira as far as overall wound progression based on what I am seeing at this point. The patient has been referred for second opinion with regard to his wound to the [facility] wound care center by his dermatologist which I definitely am not in opposition to. Unfortunately we tried multiple dressings in the past including collagen, alginate, and at one point even Hydrofera Blue. With that being said he is never really used it for any significant amount of time due to the fact that he often complains of pain associated with these dressings and then will go back to either using the Santyl which she has done intermittently or more frequently the triamcinolone. He is also using his own compression stockings. We have wrapped him in the past but again that was something else that he really was not a big fan of. 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 He seemed to do much better when he was on the steroids although honestly I understand that the steroids are not necessarily the best medication to be on long-term obviously 06/14/2019 on evaluation today patient appears to be doing actually a little bit better with  regard to the overall appearance with his leg. Unfortunately he does continue to have issues with what appears to be some  fluid underneath the muscle although he did see the wound specialty center at [facility] last week their main goals were to see about infusion therapy in place of the Humira as they feel like that is not quite strong enough. They also recommended that we continue with the treatment otherwise as we are they felt like that was appropriate and they are okay with him continuing to follow-up here with [place] in that regard. With that being said they are also sending him to the vein specialist there to see about vein stripping and if that would be of benefit for him. Subsequently they also did not really address whether or not an ultrasound of the muscle area to see if there is anything that needs to be addressed here would be appropriate or not. For that reason I discussed this with him last week I think we may proceed down that road at this point. 06/21/2019 upon evaluation today patient's wound actually appears to be doing slightly better compared to previous evaluations. I do believe that he has made a difference with regard to the progression here with the use of oral steroids. Again in the past has been the only thing that is really calm things down. He does tell me that from [facility] is gotten a good news from there that there are no further vein stripping that is necessary at this point. I do not have that available for review today although the patient did relay this to me. He also did obtain and have the ultrasound of the wound completed which I did sign off on today. It does appear that there is no fluid collection under the muscle this is likely then just edematous tissue in general. That is also good news. Overall I still believe the inflammation is the main issue here. He did inquire about the possibility of a wound VAC again with the muscle protruding like it is I am not really sure whether  the wound VAC is necessarily ideal or not. That is something we will have to consider although I do believe he may need compression wrapping to try to help with edema control which could potentially be of benefit. 06/28/2019 on evaluation today patient appears to be doing slightly better measurement wise although this is not terribly smaller he least seems to be trending towards that direction. With that being said he still seems to have purulent drainage noted in the wound bed at this time. He has been on Levaquin followed by Cipro over the past month. Unfortunately he still seems to have some issues with active infection at this time. I did perform a culture last week in order to evaluate and see if indeed there was still anything going on. Subsequently the culture did come back Monts, Acelin ([id number]) showing Pseudomonas which is consistent with the drainage has been having which is blue-green in color. He also has had an odor that again was somewhat consistent with Pseudomonas as well. Long story short it appears that the culture showed an intermediate finding with regard to how well the Cipro will work for the Pseudomonas infection. Subsequently being that he does not seem to be clearing up and at best what we are doing is just keeping this at [place] I think he may need to see infectious disease to discuss IV antibiotic options. 07/05/2019 upon evaluation today patient appears to be doing okay in regard to his leg ulcer. He has been tolerating the dressing changes at this point without complication. Fortunately there is  no signs of active infection at this time which is good news. No fevers, chills, nausea, vomiting, or diarrhea. With that being said he does have an appointment with infectious disease tomorrow and his primary care on Wednesday. Again the reason for the infectious disease referral was due to the fact that he did not seem to be fully resolving with the use of oral antibiotics  and therefore we were thinking that IV antibiotic therapy may be necessary secondary to the fact that there was an intermediate finding for how effective the Cipro may be. Nonetheless again he has been having a lot of purulent and even green drainage. Fortunately right now that seems to have calmed down over the past week with the reinitiation of the oral antibiotic. Nonetheless we will see what Dr. Megan Salon has to say. 07/12/2019 upon evaluation today patient appears to be doing about the same at this point in regard to his left lower extremity ulcer. Fortunately there is no signs of active infection at this time which is good news I do believe the Levaquin has been beneficial I did review Dr. Hale Bogus note and to be honest I agree that the patient's leg does appear to be doing better currently. What we found in the past as he does not seem to really completely resolve he will stop the antibiotic and then subsequently things will revert back to having issues with blue-green drainage, increased pain, and overall worsening in general. Obviously that is the reason I sent him back to infectious disease. 07/19/2019 upon evaluation today patient appears to be doing roughly the same in size there is really no dramatic improvement. He has started back on the Levaquin at this point and though he seems to be doing okay he did still have a lot of blue/green drainage noted on evaluation today unfortunately. I think that this is still indicative more likely of a Pseudomonas infection as previously noted and again he does see Dr. Megan Salon in just a couple of days. I do not know that were really able to effectively clear this with just oral antibiotics alone based on what I am seeing currently. Nonetheless we are still continue to try to manage as best we can with regard to the patient and his wound. I do think the wrap was helpful in decreasing the edema which is excellent news. No fevers, chills, nausea, vomiting,  or diarrhea. 07/26/2019 upon evaluation today patient appears to be doing slightly better with regard to the overall appearance of the muscle there is no dark discoloration centrally. Fortunately there is no signs of active infection at this time. No fevers, chills, nausea, vomiting, or diarrhea. Patient's wound bed currently the patient did have an appointment with Dr. Megan Salon at infectious disease last week. With that being said Dr. Megan Salon the patient states was still somewhat hesitant about put him on any IV antibiotics he wanted [place] to repeat cultures today and then see where things go going forward. He does look like Dr. Megan Salon because of some improvement the patient did have with the Levaquin wanted [place] to see about repeating cultures. If it indeed grows the Pseudomonas again then he recommended a possibility of considering a PICC line placement and IV antibiotic therapy. He plans to see the patient back in 1 to 2 weeks. 08/02/2019 upon evaluation today patient appears to be doing poorly with regard to his left lower extremity. We did get the results of his culture back it shows that he is still showing evidence of  Pseudomonas which is consistent with the purulent/blue-green drainage that he has currently. Subsequently the culture also shows that he now is showing resistance to the oral fluoroquinolones which is unfortunate as that was really the only thing to treat the infection prior. I do believe that he is looking like this is going require IV antibiotic therapy to get this under control. Fortunately there is no signs of systemic infection at this time which is good news. The patient does see Dr. Megan Salon tomorrow. 08/09/2019 upon evaluation today patient appears to be doing better with regard to his left lower extremity ulcer in regard to the overall appearance. He is currently on IV antibiotic therapy. As ordered by Dr. Megan Salon. Currently the patient is on ceftazidime which she is going  to take for the next 2 weeks and then follow-up for 4 to 5-week appointment with Dr. Megan Salon. The patient started this this past Friday symptoms have not for a total of 3 days currently in full. 08/16/2019 upon evaluation today patient's wound actually does show muscle in the base of the wound but in general does appear to be much better as far as the overall evidence of infection is concerned. In fact I feel like this is for the most part cleared up he still on the IV antibiotics he has not completed the full course yet but I think he is doing much better which is excellent news. 08/23/2019 upon evaluation today patient appears to be doing about the same with regard to his wound at this point. He tells me that he still has pain unfortunately. Fortunately there is no evidence of systemic infection at this time which is great news. There is significant muscle protrusion. 09/13/19 upon evaluation today patient appears to be doing about the same in regard to his leg unfortunately. He still has a lot of drainage coming from the ulceration there is still muscle exposed. With that being said the patient's last wound culture still showed an intermediate finding with regard to the Pseudomonas he still having the bluish/green drainage as well. Overall I do not know that the wound has completely cleared of infection at this point. Fortunately there is no signs of active infection systemically at this point which is good news. 09/20/2019 upon evaluation today patient's wound actually appears to be doing about the same based on what I am seeing currently. I do not see any signs of systemic infection he still does have evidence of some local infection and drainage. He did see Dr. Megan Salon last week and Dr. Megan Salon states that he probably does need a different IV antibiotic although he does not want to put him on this until the patient begins the Remicade infusion which is actually scheduled for about 10 days out from  today on 13 May. Following that time Dr. Megan Salon is good to see him back and then will evaluate the feasibility of starting him on the IV antibiotic therapy once again at that point. I do not disagree with this plan I do believe as Dr. Megan Salon stated in his note that I reviewed today that the patient's issue is multifactorial with the pyoderma being 1 aspect of this that were hoping the Remicade will be helpful for her. In the meantime I think the gentamicin is, helping to keep things under decent okay control in regard to the ulcer. 09/27/2019 upon evaluation today patient appears to be doing about the same with regard to his wound still there is a lot of muscle exposure though he  does have some hyper granulation tissue noted around the edge and actually some granulation tissue starting to form over the muscle which is actually good news. Fortunately there is no evidence of active infection which is also good news. His pain is less at this point. 5/21; this is a patient I have not seen in a long time. 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 He has a large wound on the left lateral leg with protruding muscle. He comes in the clinic today showing the same area on his left medial ankle. He says there is been a spot there for some time although we have not previously defined this. Today he has a clearly defined area with slight amount of skin breakdown surrounded by raised areas with a purplish hue in color. This is not painful he says it is irritated. 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 He still has muscle protruding from the lateral portion of his left leg but fortunately the new area that they were concerned about at his last visit does not appear to have opened at this point. He is currently on Remicade infusions and seems to be doing better in  my opinion in fact the wound itself seems to be overall much better. The purplish discoloration that he did have seems to have resolved and I think that is a good sign that hopefully the Remicade is doing its job. He does [name], [name] ([id number]) have some biofilm noted over the surface of the wound. 11/01/2019 on evaluation today patient's wound actually appears to be doing excellent at this time. Fortunately there is no evidence of active infection and overall I feel like he is making great progress. The Remicade seems to be due excellent job in my opinion. 11/08/19 evaluation today vision actually appears to be doing quite well with regard to his weight ulcer. He's been tolerating dressing changes without complication. Fortunately there is no evidence of infection. No fevers, chills, nausea, or vomiting noted at this time. Overall states that is having more itching than pain which is actually a good sign in my opinion. 12/13/2019 upon evaluation today patient appears to be doing well today with regard to his wound. He has been tolerating the dressing changes without complication. Fortunately there is no sign of active infection at this time. No fevers, chills, nausea, vomiting, or diarrhea. Overall I feel like the infusion therapy has been very beneficial for him. 01/06/2020 on evaluation today patient appears to be doing well with regard to his wound. This is measuring smaller and actually looks to be doing better. Fortunately there is no signs of active infection at this point. No fevers, chills, nausea, vomiting, or diarrhea. With that being said he does still have the blue-green drainage but this does not seem to be causing any significant issues currently. He has been using the gentamicin that does seem to be keeping things under decent control at this point. He goes later this morning for his next infusion therapy for the pyoderma which seems to also be very beneficial. 02/07/2020 on evaluation  today patient appears to be doing about the same in regard to his wounds currently. Fortunately there is no signs of active infection systemically he does still have evidence of local infection still using gentamicin. He also is showing some signs of improvement albeit slowly I do feel like we are making some progress here. 02/21/2020  upon evaluation today patient appears to be making some signs of improvement the wound is measuring a little bit smaller which is great news and overall I am very pleased with where he stands currently. He is going to be having infusion therapy treatment on the 15th of this month. Fortunately there is no signs of active infection at this time. 03/13/2020 I do believe patient's wound is actually showing some signs of improvement here which is great news. He has continue with the infusion therapy through rheumatology/dermatology at [facility]. That does seem to be beneficial. I still think he gets as much benefit from this as he did from the prednisone initially but nonetheless obviously this is less harsh on his body that the prednisone as far as they are concerned. 03/31/2020 on evaluation today patient's wound actually showing signs of some pretty good improvement in regard to the overall appearance of the wound bed. There is still muscle exposed though he does have some epithelial growth around the edges of the wound. Fortunately there is no signs of active infection at this time. No fevers, chills, nausea, vomiting, or diarrhea. 04/24/2020 upon evaluation today patient appears to be doing about the same in regard to his leg ulcer. He has been tolerating the dressing changes without complication. Fortunately there is no signs of active infection at this time. No fevers, chills, nausea, vomiting, or diarrhea. With that being said he still has a lot of irritation from the bandaging around the edges of the wound. We did discuss today the possibility of a referral to plastic  surgery. 05/22/2020 on evaluation today patient appears to be doing well with regard to his wounds all things considered. He has not been able to get the Chantix apparently there is a recall nurse that I was unaware of put out by [employer] involuntarily. Nonetheless for now I am and I have to do some research into what may be the best option for him to help with quitting in regard to smoking and we discussed that today. 06/26/2020 upon evaluation today patient appears to be doing well with regard to his wound from the standpoint of infection I do not see any signs of infection at this point. With that being said unfortunately he is still continuing to have issues with muscle exposure and again he is not having a whole lot of new skin growth unfortunately. There does not appear to be any signs of active infection at this time. No fevers, chills, nausea, vomiting, or diarrhea. 07/10/2020 upon evaluation today patient appears to be doing a little bit more poorly currently compared to where he was previous. I am concerned currently about an active infection that may be getting worse especially in light of the increased size and tenderness of the wound bed. No fevers, chills, nausea, vomiting, or diarrhea. 07/24/2020 upon evaluation today patient appears to be doing poorly in regard to his leg ulcer. He has been tolerating the dressing changes without complication but unfortunately is having a lot of discomfort. 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 Subsequently I think he is going require possibly IV antibiotics to get this under control. I am very concerned about the severity of his infection and the amount of discomfort he is having. 07/31/2020 upon evaluation today patient appears to be doing about the same in regard to his leg wound. He did see Dr. Megan Salon and Dr. Megan Salon is actually going to start him on IV antibiotics. He goes  for  the PICC line tomorrow. With that being said there do not have that run for 2 weeks and then see how things are doing and depending on how he is progressing they may extend that a little longer. Nonetheless I am glad this is getting ready to be in place and definitely feel it may help the patient. In the meantime is been using mainly triamcinolone to the wound bed has an anti-inflammatory. 08/07/2020 on evaluation today patient appears to be doing well with regard to his wound compared even last week. In the interim he has gotten the PICC line placed and overall this seems to be doing excellent. There does not appear to be any evidence of infection which is great news systemically although locally of course has had the infection this appears to be improving with the use of the antibiotics. 08/14/2020 upon evaluation today patient's wound actually showing signs of excellent improvement. Overall the irritation has significantly improved the drainage is back down to more of a normal level and his pain is really pretty much nonexistent compared to what it was. Obviously I think that this is significantly improved secondary to the IV antibiotic therapy which has made all the difference in the world. Again he had a resistant form of Pseudomonas for which oral antibiotics just was not cutting it. Nonetheless I do think that still we need to consider the possibility of a surgical closure for this wound is been open so long and to be honest with muscle exposed I think this can be very hard to get this to close outside of this although definitely were still working to try to do what we can in that regard. 08/21/2020 upon evaluation today patient appears to be doing very well with regard to his wounds on the left lateral lower extremity/calf area. Fortunately there does not appear to be signs of active infection which is great news and overall very pleased with where things stand today. He is actually wrapping up  his treatment with IV antibiotics tomorrow. After that we will see where things go from there. [name], [name] ([id number]) Objective Constitutional Obese and well-hydrated in no acute distress. Vitals Time Taken: 9:16 AM, Height: 71 in, Weight: 338 lbs, BMI: 47.1, Temperature: 98.1 F, Pulse: 78 bpm, Respiratory Rate: 18 breaths/min, Blood Pressure: 128/84 mmHg. Respiratory normal breathing without difficulty. Psychiatric this patient is able to make decisions and demonstrates good insight into disease process. Alert and Oriented x 3. pleasant and cooperative. General Notes: Upon inspection patient's wound bed actually showed signs of some good epithelization around the edges of the wound the granulation was good he still has muscle exposed however he does have an appointment set around the middle of May with Dr. Claudia Desanctis who is a plastic surgeon locally to discuss surgical options as far as healing is concerned. Integumentary (Hair, Skin) Wound #1 status is Open. Original cause of wound was Gradually Appeared. The date acquired was: 11/18/2015. The wound has been in treatment 193 weeks. The wound is located on the Left,Lateral Lower Leg. The wound measures 6cm length x 6.3cm width x 0.7cm depth; 29.688cm^2 area and 20.782cm^3 volume. There is muscle and Fat Layer (Subcutaneous Tissue) exposed. There is no tunneling or undermining noted. There is a small amount of purulent drainage noted. The wound margin is epibole. There is medium (34-66%) red, pink granulation within the wound bed. There is a medium (34-66%) amount of necrotic tissue within the wound bed including Adherent Slough. 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 Edema Control - Lymphedema / Segmental Compressive Device / Other: Tubigrip double layer applied - F WOUND #1: - Lower Leg Wound Laterality: Left, Lateral Cleanser: Soap and Water 1 x Per Day/30 Days Discharge Instructions: Gently cleanse wound with antibacterial soap, rinse and pat dry prior to dressing wounds Topical: Triamcinolone Acetonide Cream, 0.1%, 15 (g) tube 1 x Per Day/30 Days Discharge Instructions: to wound bed , lightly Secondary Dressing: Mepilex Border Flex, 6x6 (in/in) 1 x Per Day/30 Days Discharge Instructions: Apply to wound as directed. Do not cut. 1. Would recommend currently that we going continue with the wound care measures as before and the patient is in agreement with the plan this includes the use of the triamcinolone to the wound bed. 2. We will also continue with a border foam dressing to cover. 3. I am also going to suggest that the patient continue to monitor for any signs of worsening infection is get ready stop his antibiotic tomorrow I Oliveira, Finnian ([id number]) believe it is and then subsequently he will be getting the PICC line out. Subsequently will see how things continue to proceed from that point forward. I am can recommend Hibiclens for the patient to wash with to try to help keep bacterial infection and bacterial load in general down. I explained to him how best to use this. Basically he should wash with that on the leg and then leaving it in place and specially over the wound for about 5 minutes then rinse. We will see patient back for reevaluation in 1 week here in the clinic. If anything worsens or changes patient will contact our office for additional recommendations. Electronic Signature(s) Signed: 08/21/2020 2:43:18 PM By: Worthy Keeler PA-C Entered By: Worthy Keeler on 08/21/2020 14:43:17 Salmi, Dinero  ([id number]) -------------------------------------------------------------------------------- SuperBill Details Patient Name: [name] Date of Service: 08/21/2020 Medical Record Number: [id number] Patient Account Number: [id number] Date of Birth/Sex: October 30, 1980 (40 y.o. M) Treating RN: Carlene Coria Primary Care Provider: [name] Other Clinician: Jeanine Luz Referring Provider: [name] Treating Provider/Extender: [name] Cliche in Treatment: 193 Diagnosis Coding ICD-10 Codes Code Description [phone] Non-pressure chronic ulcer of left calf with fat layer exposed L88 Pyoderma gangrenosum L97.321 Non-pressure chronic ulcer of left ankle limited to breakdown of skin I87.2 Venous insufficiency (chronic) (peripheral) L03.116 Cellulitis of left lower limb F17.208 Nicotine dependence, unspecified, with other nicotine-induced disorders Facility Procedures CPT4 Code: [id number] Description: 99213 - WOUND CARE VISIT-LEV 3 EST PT Modifier: Quantity: 1 Physician Procedures CPT4 Code: 0034917 Description: 91505 - WC PHYS LEVEL 3 - EST PT Modifier: Quantity: 1 CPT4 Code: Description: ICD-10 Diagnosis Description L97.222 Non-pressure chronic ulcer of left calf with fat layer exposed L88 Pyoderma gangrenosum L97.321 Non-pressure chronic ulcer of left ankle limited to breakdown of skin I87.2 Venous insufficiency (chronic)  (peripheral) Modifier: Quantity: Electronic Signature(s) Signed: 08/21/2020 2:43:32 PM By: Worthy Keeler PA-C Entered By: Worthy Keeler on 08/21/2020 14:43:31

## 2020-08-22 ENCOUNTER — Ambulatory Visit (INDEPENDENT_AMBULATORY_CARE_PROVIDER_SITE_OTHER): Payer: 59 | Admitting: Internal Medicine

## 2020-08-22 ENCOUNTER — Telehealth: Payer: Self-pay

## 2020-08-22 DIAGNOSIS — A498 Other bacterial infections of unspecified site: Secondary | ICD-10-CM

## 2020-08-22 NOTE — Unmapped (Signed)
[facility] note update

## 2020-08-22 NOTE — Unmapped (Signed)
Received call from [employer] and [place], his nurse,  at [facility]. [phone]). Mr. Clason is there now for his [facility] infusion.  He has a PICC line and states he has been receiving IV Cefepime for 3 weeks for a pseudomonas infection on his leg. He was seen by Dr. Orvan Falconer at Infectious Disease at [facility] and had blood drawn Sunday. Results not sent to [facility] . He denies fever. BP 128/78, HR 88. They want to know whether to hold his Avsola infusion or to proceed.  Provided above information to Dr. Marvis Moeller and Dr. Francia Greaves.   Ok to proceed with [facility] infusion per Dr Marvis Moeller. Notified Dance movement psychotherapist at [employer].

## 2020-08-22 NOTE — Telephone Encounter (Signed)
Notified [name]) that RN pulled PICC.   [name] [name], RN

## 2020-08-22 NOTE — Assessment & Plan Note (Signed)
He has had some improvement in his wound on therapy for superficial Pseudomonas superinfection.  I will stop cefepime now and have his PICC removed.  He is scheduled for plastic surgery evaluation on 09/28/2020 for consideration of skin grafting.  He can follow-up here as needed.

## 2020-08-22 NOTE — [facility] Notes (Signed)
[name] for Infectious Disease  Patient Active Problem List   Diagnosis Date Noted  . Wound infection 07/25/2020    Priority: High  . Pseudomonas infection 07/25/2020    Priority: High  . 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 Essential hypertension 06/28/2020  . Normocytic anemia 07/07/2019  . Tobacco abuse 06/17/2018  . Ulcerative colitis ([place]) 03/05/2018  . Venous stasis 02/24/2018  . Chronic pain of left knee 12/05/2017  . Chronic pain of left ankle 12/05/2017  . Type 2 diabetes mellitus ([place]) 07/15/2017  . Seasonal allergic rhinitis 11/25/2016  . Depression 04/17/2015  . Hyperlipidemia 04/17/2015    Patient's Medications  New Prescriptions   No medications on file  Previous Medications   ACETAMINOPHEN (TYLENOL) 500 MG TABLET    Take 1,000 mg by mouth every 6 (six) hours as needed for headache (pain).   ATORVASTATIN (LIPITOR) 40 MG TABLET    Take 1 tablet (40 mg total) by mouth daily. For cholesterol.   BLOOD GLUCOSE METER KIT AND SUPPLIES KIT    Dispense based on patient and insurance preference. Use up to four times daily as directed. (FOR ICD-9 250.00, 250.01).   CLOBETASOL OINTMENT (TEMOVATE) 0.05 %    Apply 1 application topically 2 (two) times daily.    DULAGLUTIDE (TRULICITY) 2.24 MG/5.0IB SOPN    Inject 0.75 mg into the skin once a week. For diabetes.   FLUTICASONE (FLONASE) 50 MCG/ACT NASAL SPRAY    Place 1 spray into both nostrils 2 (two) times daily.   IBUPROFEN (ADVIL,MOTRIN) 200 MG TABLET    Take 400-600 mg by mouth every 6 (six) hours as needed for headache (pain).   INFLIXIMAB (REMICADE) 100 MG INJECTION    Inject into the muscle once a week.   INSULIN GLARGINE (LANTUS SOLOSTAR) 100 UNIT/ML SOLOSTAR PEN    Inject 30 Units into the skin at bedtime.   METFORMIN (GLUCOPHAGE) 1000 MG TABLET    TAKE 1 TABLET BY MOUTH TWICE DAILY WITH MEALS FOR  DIABETES   MUPIROCIN OINTMENT (BACTROBAN) 2 %    Apply topically two (2) 60g times a  day. Mix with clobetasol and apply twice daily   RELION PEN NEEDLES 31G X 6 MM MISC    USE NIGHTLY WITH INSULIN AS DIRECTED   SANTYL OINTMENT    Apply topically.   TRIAMCINOLONE OINTMENT (KENALOG) 0.1 %    Apply topically.   VARENICLINE (CHANTIX) 1 MG TABLET    Take 1/2 tablet daily for three days, then 1/2 tablet twice daily for five days, then 1 tablet twice daily thereafter for smoking cessation.   VENLAFAXINE XR (EFFEXOR-XR) 150 MG 24 HR CAPSULE    Take 1 capsule (150 mg total) by mouth daily with breakfast. For depression.  Modified Medications   No medications on file  Discontinued Medications   CEFEPIME 2 G IN SODIUM CHLORIDE 0.9 % 100 ML    Inject 2 g into the vein every 12 (twelve) hours.    Subjective: Fleet is in for his routine follow-up visit.  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  He has now been on Remicade every 6 weeks for the past 11 months.  He felt like things were getting better in January of this year but has started to have more wound breakdown and drainage.  When he was seen at the  wound center on 07/10/2020 "no infection" was noted but cultures were obtained which grew Pseudomonas resistant to quinolones and gentamicin.    He had a PICC placed and started cefepime on 08/01/2020.  He has not had any problems tolerating the PICC or cefepime.  He notes that his wound drainage has definitely decreased since starting cefepime.  Review of Systems: Review of Systems  Gastrointestinal: Positive for constipation. Negative for abdominal pain, diarrhea, nausea and vomiting.    Past Medical History:  Diagnosis Date  . Blood in stool   . Depression   . Elevated blood pressure   . Hyperlipidemia   . OSA (obstructive sleep apnea) 2014  . Seasonal allergies   . Ulcerative colitis ([place]) 03/05/2018    Social History   Tobacco Use  . Smoking status: Current Every Day Smoker     Packs/day: 1.00    Years: 14.00    Pack years: 14.00    Types: Cigarettes    Last attempt to quit: 11/25/2018    Years since quitting: 1.7  . Smokeless tobacco: Former Systems developer    Quit date: 03/17/2015  Vaping Use  . Vaping Use: Never used  Substance Use Topics  . Alcohol use: Not Currently    Alcohol/week: 0.0 standard drinks    Comment: soical    Family History  Problem Relation Age of Onset  . Alcohol abuse Paternal Aunt   . Alcohol abuse Paternal Uncle   . Stroke Paternal Uncle   . Hyperlipidemia Maternal Grandfather   . Hypertension Maternal Grandfather   . Diabetes Maternal Grandfather   . Alcohol abuse Maternal Grandfather   . Multiple sclerosis Mother   . Dementia Mother     Allergies  Allergen Reactions  . Biaxin [Clarithromycin] Other (See Comments)    Causes colitis flares  . Milk-Related Compounds Hives    Objective: Vitals:   08/22/20 1605  Weight: (!) 357 lb (161.9 kg)   Body mass index is 49.79 kg/m.  Physical Exam Constitutional:      Comments: His spirits are good.  Cardiovascular:     Rate and Rhythm: Normal rate.  Pulmonary:     Effort: Pulmonary effort is normal.  Skin:    Comments: His chronic ulcer remains unchanged over the past week. There is very scant drainage on his dressing that was placed earlier this morning.  There is no odor.  Psychiatric:        Mood and Affect: Mood normal.        Photo today     Problem List Items Addressed This Visit      High   Pseudomonas infection    He has had some improvement in his wound on therapy for superficial Pseudomonas superinfection.  I will stop cefepime now and have his PICC removed.  He is scheduled for plastic surgery evaluation on 09/28/2020 for consideration of skin grafting.  He can follow-up here as needed.          Michel Bickers, MD [facility] [phone] pager   [phone] cell 08/22/2020, 4:14 PM

## 2020-08-22 NOTE — [facility] Notes (Signed)
Per verbal order from Dr Megan Salon, 45cm Single Lumen Peripherally Inserted Central Catheter removed from right basilic, tip intact. No sutures present. RN confirmed length per chart. Dressing was clean and dry. Petroleum dressing applied. Pt advised no heavy lifting with this arm, leave dressing for 24 hours and call the office or seek emergent care if dressing becomes soaked with blood, swelling, or sharp pain presents. Patient verbalized understanding and agreement. Patient's questions answered to their satisfaction. Patient tolerated procedure well, RN walked patient to check out. [facility] notified of removal.   Carlean Purl, RN

## 2020-08-25 NOTE — Progress Notes (Signed)
[name], [name] ([id number]) Visit Report for 08/07/2020 Arrival Information Details Patient Name: [name], [name] Date of Service: 08/07/2020 8:00 AM Medical Record Number: [id number] Patient Account Number: [id number] Date of Birth/Sex: [birth year] (40 y.o. M) Treating RN: [name] Primary Care [name]: [name] Other Clinician: Jeanine [name] Referring [name]: [name] Treating [name]/Extender: [name] in Treatment: 65 Visit Information History Since Last Visit Added or deleted any medications: No Patient Arrived: Ambulatory Had a fall or experienced change in No Arrival Time: 08:21 activities of daily living that may affect Accompanied By: self risk of falls: Transfer Assistance: None Hospitalized since last visit: No Patient Identification Verified: Yes Pain Present Now: No Secondary Verification Process Completed: Yes Patient Requires Transmission-Based Precautions: No Patient Has Alerts: Yes Electronic Signature(s) Signed: 08/07/2020 4:05:02 PM By: [name] Entered By: [name] on 08/07/2020 08:27:25 [name] ([id number]) -------------------------------------------------------------------------------- Clinic Level of Care Assessment Details Patient Name: [name] Date of Service: 08/07/2020 8:00 AM Medical Record Number: [id number] Patient Account Number: [id number] Date of Birth/Sex: 10-04-1980 (39 y.o. M) Treating RN: [name] Primary Care [name]: [name] Other Clinician: Jeanine [name] Referring [name]: [name] Treating [name]/Extender: [name] in Treatment: [facility] Level of Care Assessment Items TOOL 4 Quantity Score X - Use when only an EandM is performed on FOLLOW-UP visit 1 0 ASSESSMENTS - Nursing Assessment / Reassessment X - Reassessment of Co-morbidities (includes updates in patient status) 1 10 X- 1 5 Reassessment of Adherence to Treatment Plan ASSESSMENTS - Wound  and Skin Assessment / Reassessment X - Simple Wound Assessment / Reassessment - one wound 1 5 []  - 0 Complex Wound Assessment / Reassessment - multiple wounds []  - 0 Dermatologic / Skin Assessment (not related to wound area) ASSESSMENTS - Focused Assessment []  - Circumferential Edema Measurements - multi extremities 0 []  - 0 Nutritional Assessment / Counseling / Intervention []  - 0 Lower Extremity Assessment (monofilament, tuning fork, pulses) []  - 0 Peripheral Arterial Disease Assessment (using hand held doppler) ASSESSMENTS - Ostomy and/or Continence Assessment and Care []  - Incontinence Assessment and Management 0 []  - 0 Ostomy Care Assessment and Management (repouching, etc.) PROCESS - Coordination of Care X - Simple Patient / Family Education for ongoing care 1 15 []  - 0 Complex (extensive) Patient / Family Education for ongoing care X- 1 10 Staff obtains Consents, Records, Test Results / Process Orders []  - 0 Staff telephones HHA, Nursing Homes / Clarify orders / etc []  - 0 Routine Transfer to another Facility (non-emergent condition) []  - 0 Routine Hospital Admission (non-emergent condition) []  - 0 New Admissions / Biomedical engineer / Ordering NPWT, Apligraf, etc. []  - 0 Emergency Hospital Admission (emergent condition) X- 1 10 Simple Discharge Coordination []  - 0 Complex (extensive) Discharge Coordination PROCESS - Special Needs []  - Pediatric / Minor Patient Management 0 []  - 0 Isolation Patient Management []  - 0 Hearing / Language / Visual special needs []  - 0 Assessment of Community assistance (transportation, D/C planning, etc.) []  - 0 Additional assistance / Altered mentation []  - 0 Support Surface(s) Assessment (bed, cushion, seat, etc.) INTERVENTIONS - Wound Cleansing / Measurement [name], [name] ([id number]) X- 1 5 Simple Wound Cleansing - one wound []  - 0 Complex Wound Cleansing - multiple wounds []  - 0 Wound Imaging (photographs - any  number of wounds) []  - 0 Wound Tracing (instead of photographs) X- 1 5 Simple Wound Measurement - one wound []  - 0 Complex Wound Measurement - multiple wounds INTERVENTIONS - Wound Dressings []  -  Small Wound Dressing one or multiple wounds 0 []  - 0 Medium Wound Dressing one or multiple wounds X- 1 20 Large Wound Dressing one or multiple wounds X- 1 5 Application of Medications - topical []  - 0 Application of Medications - injection INTERVENTIONS - Miscellaneous []  - External ear exam 0 []  - 0 Specimen Collection (cultures, biopsies, blood, body fluids, etc.) []  - 0 Specimen(s) / Culture(s) sent or taken to Lab for analysis []  - 0 Patient Transfer (multiple staff / [employer] Service fast streamer / Similar devices) []  - 0 Simple Staple / Suture removal (25 or less) []  - 0 Complex Staple / Suture removal (26 or more) []  - 0 Hypo / Hyperglycemic Management (close monitor of Blood Glucose) []  - 0 Ankle / Brachial Index (ABI) - do not check if billed separately X- 1 5 Vital Signs Has the patient been seen at the hospital within the last three years: Yes Total Score: 95 Level Of Care: New/Established - Level 3 Electronic Signature(s) Signed: 08/25/2020 8:11:40 AM By: [name] Coria RN Entered By: [name] on 08/07/2020 08:39:08 [name] ([id number]) -------------------------------------------------------------------------------- Encounter Discharge Information Details Patient Name: [name] Date of Service: 08/07/2020 8:00 AM Medical Record Number: [id number] Patient Account Number: [id number] Date of Birth/Sex: 1981-01-19 (40 y.o. M) Treating RN: [name] Primary Care Rafiel Mecca: [name] Other Clinician: Jeanine [name] Referring Jarrad Mclees: [name] Treating Arrie Zuercher/Extender: [name] in Treatment: [phone] Encounter Discharge Information Items Discharge Condition: Stable Ambulatory Status: Ambulatory Discharge Destination: Home Transportation: Private  Auto Accompanied By: self Schedule Follow-up Appointment: Yes Clinical Summary of Care: Electronic Signature(s) Signed: 08/07/2020 4:25:39 PM By: [name], [name] Breeding RN Entered By: [name], [name] on 08/07/2020 09:00:45 [name] ([id number]) -------------------------------------------------------------------------------- Lower Extremity Assessment Details Patient Name: [name] Date of Service: 08/07/2020 8:00 AM Medical Record Number: [id number] Patient Account Number: [id number] Date of Birth/Sex: 09-09-1980 (39 y.o. M) Treating RN: [name] Primary Care Waverley Krempasky: [name] Other Clinician: Jeanine [name] Referring Mariana Wiederholt: [name] Treating My Rinke/Extender: [name] in Treatment: 191 Edema Assessment Assessed: [Left: Yes] [name]: No] Edema: [Left: Ye] [Right: s] Calf Left: Right: Point of Measurement: 33 cm From Medial Instep 49 cm Ankle Left: Right: Point of Measurement: 12 cm From Medial Instep 30 cm Vascular Assessment Pulses: Dorsalis Pedis Palpable: [Left:Yes] Electronic Signature(s) Signed: 08/07/2020 4:05:02 PM By: [name] Signed: 08/25/2020 8:11:40 AM By: [name] Coria RN Entered By: [name] on 08/07/2020 08:30:19 [name] ([id number]) -------------------------------------------------------------------------------- Multi Wound Chart Details Patient Name: [name] Date of Service: 08/07/2020 8:00 AM Medical Record Number: [id number] Patient Account Number: [id number] Date of Birth/Sex: 09-10-1980 (39 y.o. M) Treating RN: [name] Primary Care Daylan Juhnke: [name] Other Clinician: Jeanine [name] Referring Kaydon Creedon: [name] Treating Cambree Hendrix/Extender: [name] in Treatment: 191 Vital Signs Height(in): 71 Pulse(bpm): 55 Weight(lbs): 338 Blood Pressure(mmHg): 144/81 Body Mass Index(BMI): 47 Temperature(F): 98.2 Respiratory Rate(breaths/min): 18 Photos:  [N/A:N/A] Wound Location: Left, Lateral Lower Leg N/A N/A Wounding Event: Gradually Appeared N/A N/A Primary Etiology: Pyoderma N/A N/A Comorbid History: Sleep Apnea, Hypertension, Colitis N/A N/A Date Acquired: 11/18/2015 N/A N/A Weeks of Treatment: 191 N/A N/A Wound Status: Open N/A N/A Measurements L x W x D (cm) 6.3x6x0.9 N/A N/A Area (cm) : 29.688 N/A N/A Volume (cm) : 26.719 N/A N/A % Reduction in Area: -504.80% N/A N/A % Reduction in Volume: -580.40% N/A N/A Classification: Full Thickness With Exposed N/A N/A Support Structures Exudate Amount: Small N/A N/A Exudate Type: Purulent N/A N/A Exudate Color: yellow, brown,  green N/A N/A Wound Margin: Epibole N/A N/A Granulation Amount: Medium (34-66%) N/A N/A Granulation Quality: Red, Pink N/A N/A Necrotic Amount: Medium (34-66%) N/A N/A Exposed Structures: Fat Layer (Subcutaneous Tissue): N/A N/A Yes Muscle: Yes Fascia: No Tendon: No Joint: No Bone: No Epithelialization: None N/A N/A Treatment Notes Electronic Signature(s) Signed: 08/25/2020 8:11:40 AM By: [name] Coria RN Entered By: [name] on 08/07/2020 08:37:35 [name] ([id number]) -------------------------------------------------------------------------------- Multi-Disciplinary Care Plan Details Patient Name: [name] Date of Service: 08/07/2020 8:00 AM Medical Record Number: [id number] Patient Account Number: [id number] Date of Birth/Sex: 1980/07/07 (40 y.o. M) Treating RN: [name] Primary Care Amaiya Scruton: [name] Other Clinician: Jeanine [name] Referring Dariya Gainer: [name] Treating Devanta Daniel/Extender: [name] in Treatment: [place] reviewed with physician Active Inactive Wound/Skin Impairment Nursing Diagnoses: Impaired tissue integrity Knowledge deficit related to ulceration/compromised skin integrity Goals: Patient/caregiver will verbalize understanding of skin care regimen Date  Initiated: 12/11/2016 Target Resolution Date: 08/24/2017 Goal Status: Active Ulcer/skin breakdown will have a volume reduction of 30% by week 4 Date Initiated: 12/11/2016 Date Inactivated: 06/26/2020 Target Resolution Date: 03/13/2017 Goal Status: Unmet Unmet Reason: comorbities Ulcer/skin breakdown will have a volume reduction of 50% by week 8 Date Initiated: 12/11/2016 Date Inactivated: 06/26/2020 Target Resolution Date: 03/13/2017 Goal Status: Unmet Unmet Reason: comorbities Ulcer/skin breakdown will have a volume reduction of 80% by week 12 Date Initiated: 12/11/2016 Date Inactivated: 06/26/2020 Target Resolution Date: 03/13/2017 Goal Status: Unmet Unmet Reason: comorbities Ulcer/skin breakdown will heal within 14 weeks Date Initiated: 12/11/2016 Date Inactivated: 06/26/2020 Target Resolution Date: 03/13/2017 Goal Status: Unmet Unmet Reason: comorbities Interventions: Assess patient/caregiver ability to obtain necessary supplies Assess patient/caregiver ability to perform ulcer/skin care regimen upon admission and as needed Assess ulceration(s) every visit Provide education on ulcer and skin care Treatment Activities: Skin care regimen initiated : 12/11/2016 Topical wound management initiated : 12/11/2016 Notes: Electronic Signature(s) Signed: 08/25/2020 8:11:40 AM By: [name] Coria RN Entered By: [name] on 08/07/2020 08:37:11 [name] ([id number]) -------------------------------------------------------------------------------- Pain Assessment Details Patient Name: [name] Date of Service: 08/07/2020 8:00 AM Medical Record Number: [id number] Patient Account Number: [id number] Date of Birth/Sex: [birth year] (40 y.o. M) Treating RN: [name] Primary Care Gearlene Godsil: [name] Other Clinician: Jeanine [name] Referring Eira Alpert: [name] Treating Sair Faulcon/Extender: [name] in Treatment: [phone] Active Problems Location of Pain Severity and  Description of Pain Patient Has Paino No Site Locations Pain Management and Medication Current Pain Management: Electronic Signature(s) Signed: 08/07/2020 4:05:02 PM By: [name] Signed: 08/25/2020 8:11:40 AM By: [name] Coria RN Entered By: [name] on 08/07/2020 08:27:55 [name] ([id number]) -------------------------------------------------------------------------------- Patient/Caregiver Education Details Patient Name: [name] Date of Service: 08/07/2020 8:00 AM Medical Record Number: [id number] Patient Account Number: [id number] Date of Birth/Gender: 06-10-1980 (39 y.o. M) Treating RN: [name] Primary Care Physician: [name] Other Clinician: Jeanine [name] Referring Physician: Alma [name] Treating Physician/Extender: [name] in Treatment: [phone] Education Assessment Education Provided To: Patient Education Topics Provided Wound/Skin Impairment: Methods: Explain/Verbal Responses: State content correctly Electronic Signature(s) Signed: 08/25/2020 8:11:40 AM By: [name] Coria RN Entered By: [name] on 08/07/2020 08:39:31 [name] ([id number]) -------------------------------------------------------------------------------- Wound Assessment Details Patient Name: [name] Date of Service: 08/07/2020 8:00 AM Medical Record Number: [id number] Patient Account Number: [id number] Date of Birth/Sex: [birth year] (39 y.o. M) Treating RN: [name] Primary Care Kayal Mula: [name] Other Clinician: Jeanine [name] Referring Jearlean Demauro: [name] Treating Tito Ausmus/Extender: [name] in Treatment: 191 Wound Status Wound Number: 1 Primary Etiology:  Pyoderma Wound Location: Left, Lateral Lower Leg Wound Status: Open Wounding Event: Gradually Appeared Comorbid History: Sleep Apnea, Hypertension, Colitis Date Acquired: 11/18/2015 Weeks Of Treatment: 191 Clustered Wound: No Photos Wound Measurements Length:  (cm) 6.3 Width: (cm) 6 Depth: (cm) 0.9 Area: (cm) 29.688 Volume: (cm) 26.719 % Reduction in Area: -504.8% % Reduction in Volume: -580.4% Epithelialization: None Tunneling: No Undermining: No Wound Description Classification: Full Thickness With Exposed Support Structures Wound Margin: Epibole Exudate Amount: Small Exudate Type: Purulent Exudate Color: yellow, brown, green Foul Odor After Cleansing: No Slough/Fibrino Yes Wound Bed Granulation Amount: Medium (34-66%) Exposed Structure Granulation Quality: Red, Pink Fascia Exposed: No Necrotic Amount: Medium (34-66%) Fat Layer (Subcutaneous Tissue) Exposed: Yes Necrotic Quality: Adherent Slough Tendon Exposed: No Muscle Exposed: Yes Necrosis of Muscle: No Joint Exposed: No Bone Exposed: No Electronic Signature(s) Signed: 08/07/2020 4:05:02 PM By: [name] Signed: 08/25/2020 8:11:40 AM By: [name] Coria RN Entered By: [name] on 08/07/2020 08:28:47 [name] ([id number]) -------------------------------------------------------------------------------- Vitals Details Patient Name: [name] Date of Service: 08/07/2020 8:00 AM Medical Record Number: [id number] Patient Account Number: [id number] Date of Birth/Sex: 04/08/1981 (40 y.o. M) Treating RN: [name] Primary Care Wallace Cogliano: [name] Other Clinician: Jeanine [name] Referring Arbadella Kimbler: [name] Treating Hortensia Duffin/Extender: [name] in Treatment: 191 Vital Signs Time Taken: 08:15 Temperature (F): 98.2 Height (in): 71 Pulse (bpm): 79 Weight (lbs): 338 Respiratory Rate (breaths/min): 18 Body Mass Index (BMI): 47.1 Blood Pressure (mmHg): 144/81 Reference Range: 80 - 120 mg / dl Electronic Signature(s) Signed: 08/07/2020 4:05:02 PM By: [name] Entered By: [name] on 08/07/2020 08:27:49

## 2020-08-25 NOTE — Progress Notes (Signed)
NAVJOT, LOERA ([id number]) Visit Report for 08/21/2020 Arrival Information Details Patient Name: [name], [name] Date of Service: 08/21/2020 9:15 AM Medical Record Number: [id number] Patient Account Number: [id number] Date of Birth/Sex: 1980-12-02 (40 y.o. M) Treating RN: Carlene Coria Primary Care Sai Zinn: [name] Other Clinician: Jeanine Luz Referring Nava Song: [name] Treating Lakeeta Dobosz/Extender: [name] Cliche in Treatment: 57 Visit Information History Since Last Visit Added or deleted any medications: No Patient Arrived: Ambulatory Had a fall or experienced change in No Arrival Time: 09:17 activities of daily living that may affect Accompanied By: self risk of falls: Transfer Assistance: None Hospitalized since last visit: No Patient Identification Verified: Yes Pain Present Now: No Secondary Verification Process Completed: Yes Patient Requires Transmission-Based Precautions: No Patient Has Alerts: Yes Electronic Signature(s) Signed: 08/21/2020 4:17:01 PM By: Jeanine Luz Entered By: Jeanine Luz on 08/21/2020 09:17:26 [name] ([id number]) -------------------------------------------------------------------------------- Clinic Level of Care Assessment Details Patient Name: [name] Date of Service: 08/21/2020 9:15 AM Medical Record Number: [id number] Patient Account Number: [id number] Date of Birth/Sex: [birth year] (39 y.o. M) Treating RN: Carlene Coria Primary Care Avonna Iribe: [name] Other Clinician: Jeanine Luz Referring Saleha Kalp: [name] Treating Mack Alvidrez/Extender: [name] Cliche in Treatment: [facility] Level of Care Assessment Items TOOL 4 Quantity Score X - Use when only an EandM is performed on FOLLOW-UP visit 1 0 ASSESSMENTS - Nursing Assessment / Reassessment X - Reassessment of Co-morbidities (includes updates in patient status) 1 10 X- 1 5 Reassessment of Adherence to Treatment Plan ASSESSMENTS - Wound and  Skin Assessment / Reassessment X - Simple Wound Assessment / Reassessment - one wound 1 5 []  - 0 Complex Wound Assessment / Reassessment - multiple wounds []  - 0 Dermatologic / Skin Assessment (not related to wound area) ASSESSMENTS - Focused Assessment []  - Circumferential Edema Measurements - multi extremities 0 []  - 0 Nutritional Assessment / Counseling / Intervention []  - 0 Lower Extremity Assessment (monofilament, tuning fork, pulses) []  - 0 Peripheral Arterial Disease Assessment (using hand held doppler) ASSESSMENTS - Ostomy and/or Continence Assessment and Care []  - Incontinence Assessment and Management 0 []  - 0 Ostomy Care Assessment and Management (repouching, etc.) PROCESS - Coordination of Care X - Simple Patient / Family Education for ongoing care 1 15 []  - 0 Complex (extensive) Patient / Family Education for ongoing care X- 1 10 Staff obtains Consents, Records, Test Results / Process Orders []  - 0 Staff telephones HHA, Nursing Homes / Clarify orders / etc []  - 0 Routine Transfer to another Facility (non-emergent condition) []  - 0 Routine Hospital Admission (non-emergent condition) []  - 0 New Admissions / Biomedical engineer / Ordering NPWT, Apligraf, etc. []  - 0 Emergency Hospital Admission (emergent condition) X- 1 10 Simple Discharge Coordination []  - 0 Complex (extensive) Discharge Coordination PROCESS - Special Needs []  - Pediatric / Minor Patient Management 0 []  - 0 Isolation Patient Management []  - 0 Hearing / Language / Visual special needs []  - 0 Assessment of Community assistance (transportation, D/C planning, etc.) []  - 0 Additional assistance / Altered mentation []  - 0 Support Surface(s) Assessment (bed, cushion, seat, etc.) INTERVENTIONS - Wound Cleansing / Measurement [name], [name] ([id number]) X- 1 5 Simple Wound Cleansing - one wound []  - 0 Complex Wound Cleansing - multiple wounds X- 1 5 Wound Imaging (photographs - any number  of wounds) []  - 0 Wound Tracing (instead of photographs) X- 1 5 Simple Wound Measurement - one wound []  - 0 Complex Wound Measurement - multiple wounds INTERVENTIONS - Wound Dressings []  -  Small Wound Dressing one or multiple wounds 0 X- 1 15 Medium Wound Dressing one or multiple wounds []  - 0 Large Wound Dressing one or multiple wounds X- 1 5 Application of Medications - topical []  - 0 Application of Medications - injection INTERVENTIONS - Miscellaneous []  - External ear exam 0 []  - 0 Specimen Collection (cultures, biopsies, blood, body fluids, etc.) []  - 0 Specimen(s) / Culture(s) sent or taken to Lab for analysis []  - 0 Patient Transfer (multiple staff / Civil Service fast streamer / Similar devices) []  - 0 Simple Staple / Suture removal (25 or less) []  - 0 Complex Staple / Suture removal (26 or more) []  - 0 Hypo / Hyperglycemic Management (close monitor of Blood Glucose) []  - 0 Ankle / Brachial Index (ABI) - do not check if billed separately X- 1 5 Vital Signs Has the patient been seen at the hospital within the last three years: Yes Total Score: 95 Level Of Care: New/Established - Level 3 Electronic Signature(s) Signed: 08/25/2020 8:10:37 AM By: Carlene Coria RN Entered By: Carlene Coria on 08/21/2020 09:49:06 [name] ([id number]) -------------------------------------------------------------------------------- Encounter Discharge Information Details Patient Name: [name] Date of Service: 08/21/2020 9:15 AM Medical Record Number: [id number] Patient Account Number: [id number] Date of Birth/Sex: 1980-12-04 (40 y.o. M) Treating RN: Carlene Coria Primary Care Nakiea Metzner: [name] Other Clinician: Jeanine Luz Referring Raiven Belizaire: [name] Treating Hawkin Charo/Extender: [name] Cliche in Treatment: [phone] Encounter Discharge Information Items Discharge Condition: Stable Ambulatory Status: Ambulatory Discharge Destination: Home Transportation: Private  Auto Accompanied By: self Schedule Follow-up Appointment: Yes Clinical Summary of Care: Patient Declined Electronic Signature(s) Signed: 08/25/2020 8:10:37 AM By: Carlene Coria RN Entered By: Carlene Coria on 08/21/2020 09:55:24 [name] ([id number]) -------------------------------------------------------------------------------- Lower Extremity Assessment Details Patient Name: [name] Date of Service: 08/21/2020 9:15 AM Medical Record Number: [id number] Patient Account Number: [id number] Date of Birth/Sex: Sep 28, 1980 (39 y.o. M) Treating RN: Carlene Coria Primary Care Afomia Blackley: [name] Other Clinician: Jeanine Luz Referring Nelsie Domino: [name] Treating Terril Chestnut/Extender: [name] Cliche in Treatment: 193 Edema Assessment Assessed: [Left: No] [Right: No] [Left: Edema] [Right: :] Calf Left: Right: Point of Measurement: 33 cm From Medial Instep 38.3 cm Ankle Left: Right: Point of Measurement: 12 cm From Medial Instep 30.2 cm Vascular Assessment Pulses: Dorsalis Pedis Palpable: [Left:Yes] Electronic Signature(s) Signed: 08/21/2020 4:17:01 PM By: Jeanine Luz Signed: 08/25/2020 8:10:37 AM By: Carlene Coria RN Entered By: Jeanine Luz on 08/21/2020 09:28:33 [name] ([id number]) -------------------------------------------------------------------------------- Multi Wound Chart Details Patient Name: [name] Date of Service: 08/21/2020 9:15 AM Medical Record Number: [id number] Patient Account Number: [id number] Date of Birth/Sex: Dec 02, 1980 (39 y.o. M) Treating RN: Carlene Coria Primary Care Alaia Lordi: [name] Other Clinician: Jeanine Luz Referring Maykel Reitter: [name] Treating Roland Prine/Extender: [name] Cliche in Treatment: 193 Vital Signs Height(in): 71 Pulse(bpm): 77 Weight(lbs): 338 Blood Pressure(mmHg): 128/84 Body Mass Index(BMI): 47 Temperature(F): 98.1 Respiratory Rate(breaths/min): 18 Photos: [N/A:N/A] Wound  Location: Left, Lateral Lower Leg N/A N/A Wounding Event: Gradually Appeared N/A N/A Primary Etiology: Pyoderma N/A N/A Comorbid History: Sleep Apnea, Hypertension, Colitis N/A N/A Date Acquired: 11/18/2015 N/A N/A Weeks of Treatment: 193 N/A N/A Wound Status: Open N/A N/A Measurements L x W x D (cm) 6x6.3x0.7 N/A N/A Area (cm) : 29.688 N/A N/A Volume (cm) : 20.782 N/A N/A % Reduction in Area: -504.80% N/A N/A % Reduction in Volume: -429.20% N/A N/A Classification: Full Thickness With Exposed N/A N/A Support Structures Exudate Amount: Small N/A N/A Exudate Type: Purulent N/A N/A Exudate Color: yellow, brown, green  N/A N/A Wound Margin: Epibole N/A N/A Granulation Amount: Medium (34-66%) N/A N/A Granulation Quality: Red, Pink N/A N/A Necrotic Amount: Medium (34-66%) N/A N/A Exposed Structures: Fat Layer (Subcutaneous Tissue): N/A N/A Yes Muscle: Yes Fascia: No Tendon: No Joint: No Bone: No Epithelialization: None N/A N/A Treatment Notes Electronic Signature(s) Signed: 08/25/2020 8:10:37 AM By: Carlene Coria RN Entered By: Carlene Coria on 08/21/2020 09:47:56 [name] ([id number]) -------------------------------------------------------------------------------- Multi-Disciplinary Care Plan Details Patient Name: [name] Date of Service: 08/21/2020 9:15 AM Medical Record Number: [id number] Patient Account Number: [id number] Date of Birth/Sex: [birth year] (39 y.o. M) Treating RN: Carlene Coria Primary Care Kaislee Chao: [name] Other Clinician: Jeanine Luz Referring Riggins Cisek: [name] Treating Katherene Dinino/Extender: [name] Cliche in Treatment: [place] reviewed with physician Active Inactive Wound/Skin Impairment Nursing Diagnoses: Impaired tissue integrity Knowledge deficit related to ulceration/compromised skin integrity Goals: Patient/caregiver will verbalize understanding of skin care regimen Date Initiated:  12/11/2016 Target Resolution Date: 08/24/2017 Goal Status: Active Ulcer/skin breakdown will have a volume reduction of 30% by week 4 Date Initiated: 12/11/2016 Date Inactivated: 06/26/2020 Target Resolution Date: 03/13/2017 Goal Status: Unmet Unmet Reason: comorbities Ulcer/skin breakdown will have a volume reduction of 50% by week 8 Date Initiated: 12/11/2016 Date Inactivated: 06/26/2020 Target Resolution Date: 03/13/2017 Goal Status: Unmet Unmet Reason: comorbities Ulcer/skin breakdown will have a volume reduction of 80% by week 12 Date Initiated: 12/11/2016 Date Inactivated: 06/26/2020 Target Resolution Date: 03/13/2017 Goal Status: Unmet Unmet Reason: comorbities Ulcer/skin breakdown will heal within 14 weeks Date Initiated: 12/11/2016 Date Inactivated: 06/26/2020 Target Resolution Date: 03/13/2017 Goal Status: Unmet Unmet Reason: comorbities Interventions: Assess patient/caregiver ability to obtain necessary supplies Assess patient/caregiver ability to perform ulcer/skin care regimen upon admission and as needed Assess ulceration(s) every visit Provide education on ulcer and skin care Treatment Activities: Skin care regimen initiated : 12/11/2016 Topical wound management initiated : 12/11/2016 Notes: Electronic Signature(s) Signed: 08/25/2020 8:10:37 AM By: Carlene Coria RN Entered By: Carlene Coria on 08/21/2020 09:47:44 [name], [name] ([id number]) -------------------------------------------------------------------------------- Pain Assessment Details Patient Name: [name] Date of Service: 08/21/2020 9:15 AM Medical Record Number: [id number] Patient Account Number: [id number] Date of Birth/Sex: 08/01/1980 (40 y.o. M) Treating RN: Carlene Coria Primary Care Ermie Glendenning: [name] Other Clinician: Jeanine Luz Referring Lateefah Mallery: [name] Treating Danyell Awbrey/Extender: [name] Cliche in Treatment: 193 Active Problems Location of Pain Severity and Description of  Pain Patient Has Paino No Site Locations Rate the pain. Current Pain Level: 0 Pain Management and Medication Current Pain Management: Electronic Signature(s) Signed: 08/21/2020 4:17:01 PM By: Jeanine Luz Signed: 08/25/2020 8:10:37 AM By: Carlene Coria RN Entered By: Jeanine Luz on 08/21/2020 [name], [name] ([id number]) -------------------------------------------------------------------------------- Patient/Caregiver Education Details Patient Name: [name] Date of Service: 08/21/2020 9:15 AM Medical Record Number: [id number] Patient Account Number: [id number] Date of Birth/Gender: 1980-10-12 (39 y.o. M) Treating RN: Carlene Coria Primary Care Physician: [name] Other Clinician: Jeanine Luz Referring Physician: [name] Treating Physician/Extender: [name] Cliche in Treatment: [phone] Education Assessment Education Provided To: Patient Education Topics Provided Wound/Skin Impairment: Methods: Explain/Verbal Responses: State content correctly Electronic Signature(s) Signed: 08/25/2020 8:10:37 AM By: Carlene Coria RN Entered By: Carlene Coria on 08/21/2020 09:49:27 [name] ([id number]) -------------------------------------------------------------------------------- Wound Assessment Details Patient Name: [name] Date of Service: 08/21/2020 9:15 AM Medical Record Number: [id number] Patient Account Number: [id number] Date of Birth/Sex: 1980-09-20 (39 y.o. M) Treating RN: Carlene Coria Primary Care Leshonda Galambos: [name] Other Clinician: Jeanine Luz Referring Remedios Mckone: [name] Treating Kaitlan Bin/Extender: [name] Cliche in Treatment: 193 Wound  Status Wound Number: 1 Primary Etiology: Pyoderma Wound Location: Left, Lateral Lower Leg Wound Status: Open Wounding Event: Gradually Appeared Comorbid History: Sleep Apnea, Hypertension, Colitis Date Acquired: 11/18/2015 Weeks Of Treatment: 193 Clustered Wound: No Photos Wound  Measurements Length: (cm) 6 Width: (cm) 6.3 Depth: (cm) 0.7 Area: (cm) 29.688 Volume: (cm) 20.782 % Reduction in Area: -504.8% % Reduction in Volume: -429.2% Epithelialization: None Tunneling: No Undermining: No Wound Description Classification: Full Thickness With Exposed Support Structures Wound Margin: Epibole Exudate Amount: Small Exudate Type: Purulent Exudate Color: yellow, brown, green Foul Odor After Cleansing: No Slough/Fibrino Yes Wound Bed Granulation Amount: Medium (34-66%) Exposed Structure Granulation Quality: Red, Pink Fascia Exposed: No Necrotic Amount: Medium (34-66%) Fat Layer (Subcutaneous Tissue) Exposed: Yes Necrotic Quality: Adherent Slough Tendon Exposed: No Muscle Exposed: Yes Necrosis of Muscle: No Joint Exposed: No Bone Exposed: No Treatment Notes Wound #1 (Lower Leg) Wound Laterality: Left, Lateral Cleanser Soap and Water Discharge Instruction: Gently cleanse wound with antibacterial soap, rinse and pat dry prior to dressing wounds [name], [name] ([id number]) Peri-Wound Care Topical Triamcinolone Acetonide Cream, 0.1%, 15 (g) tube Discharge Instruction: to wound bed , lightly Primary Dressing Secondary Dressing Mepilex Border Flex, 6x6 (in/in) Discharge Instruction: Apply to wound as directed. Do not cut. Secured With Compression Wrap Compression Stockings Add-Ons Electronic Signature(s) Signed: 08/21/2020 4:17:01 PM By: Jeanine Luz Signed: 08/25/2020 8:10:37 AM By: Carlene Coria RN Entered By: Jeanine Luz on 08/21/2020 09:26:16 [name], [name] ([id number]) -------------------------------------------------------------------------------- Vitals Details Patient Name: [name] Date of Service: 08/21/2020 9:15 AM Medical Record Number: [id number] Patient Account Number: [id number] Date of Birth/Sex: 15-May-1981 (39 y.o. M) Treating RN: Carlene Coria Primary Care Latavius Capizzi: [name] Other Clinician: Jeanine Luz Referring  Deatra Mcmahen: [name] Treating Tyri Elmore/Extender: [name] Cliche in Treatment: 193 Vital Signs Time Taken: 09:16 Temperature (F): 98.1 Height (in): 71 Pulse (bpm): 78 Weight (lbs): 338 Respiratory Rate (breaths/min): 18 Body Mass Index (BMI): 47.1 Blood Pressure (mmHg): 128/84 Reference Range: 80 - 120 mg / dl Electronic Signature(s) Signed: 08/21/2020 4:17:01 PM By: Jeanine Luz Entered By: Jeanine Luz on 08/21/2020 09:18:52

## 2020-08-28 ENCOUNTER — Other Ambulatory Visit: Payer: Self-pay

## 2020-08-28 ENCOUNTER — Encounter: Payer: 59 | Admitting: Physician Assistant

## 2020-08-28 DIAGNOSIS — L97825 Non-pressure chronic ulcer of other part of left lower leg with muscle involvement without evidence of necrosis: Secondary | ICD-10-CM | POA: Diagnosis not present

## 2020-08-28 NOTE — Progress Notes (Addendum)
CAZ, WEAVER ([id number]) Visit Report for 08/28/2020 Chief Complaint Document Details Patient Name: [name], [name] Date of Service: 08/28/2020 9:00 AM Medical Record Number: [id number] Patient Account Number: [id number] Date of Birth/Sex: 1980/12/18 (40 y.o. 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 M) Treating RN: Carlene Coria Primary Care Provider: [name] Other Clinician: Jeanine Luz Referring Provider: [name] Treating Provider/Extender: [name] Cliche in Treatment: 194 History of Present Illness HPI Description: 12/04/16; 41 year old man who comes into the clinic today for review of a wound on the posterior left calf. He tells me that is been there for about a year. He is not a diabetic he does smoke half a pack per day. He was seen in the ER on 11/20/16 felt to have cellulitis around the wound and was given clindamycin. An x-ray did not show osteomyelitis. The patient initially tells me that he has a milk allergy that sets off a pruritic itching rash on his lower legs which she scratches incessantly and he thinks that's what may have set up the wound. He has been using various topical antibiotics and ointments without any effect. He works in a [employer] and is on his feet all day.  He does not have a prior history of wounds however he does have the rash on both lower legs the right arm and the ventral aspect of his left arm. These are excoriations and clearly have had scratching however there are of macular looking areas on both legs including a substantial larger area on the right leg. This does not have an underlying open area. There is no blistering. The patient tells me that 2 years ago in [place] in response to the rash on his legs he saw a dermatologist who told him he had a condition which may be pyoderma gangrenosum although I may be putting words into his mouth. He seemed to recognize this. 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 He is not in any treatment for this. He's had no recent travel 12/11/16; the patient arrives today with his wound and roughly the same condition we've been using silver alginate this is a deep punched out wound with some surrounding erythema but no tenderness. Biopsy I did did not show confirmed pyoderma gangrenosum suggested nonspecific inflammation and vasculitis but does not provide an actual description of what was seen by the pathologist. I'm really not able to understand this We have also received information from the patient's dermatologist in [place] notes from April 2016. This was a doctor Agarwal-antal. 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 He was prescribed topical steroid high potency under occlusion which helped but at this point the patient did not have a deep punched out wound. 12/18/16; the patient's wound is larger in terms of surface area however this surface looks better and there is less depth. The surrounding erythema also is better. The patient states that the wrap we put on came off 2 days ago when he has been  using his compression stockings. He we are in the process of getting a dermatology consult. 12/26/16 on evaluation today patient's left lower extremity wound  shows evidence of infection with surrounding erythema noted. He has been tolerating the dressing changes but states that he has noted more discomfort. There is a larger area of erythema surrounding the wound. No fevers, chills, nausea, or vomiting noted at this time. With that being said the wound still does have slough covering the surface. He is not allergic to any medication that he is aware of at this point. In regard to his right lower extremity he had several regions that are erythematous and pruritic he wonders if there's anything we can do to help that. 01/02/17 I reviewed patient's wound culture which was obtained his visit last week. He was placed on doxycycline at that point. Unfortunately that does not appear to be an antibiotic that would likely help with the situation however the pseudomonas noted on culture is sensitive to Cipro. Also unfortunately patient's wound seems to have a large compared to last week's evaluation. Not severely so but there are definitely increased measurements in general. He is continuing to have discomfort as well he writes this to be a seven out of 10. In fact he would prefer me not to perform any debridement today due to the fact that he is having discomfort and considering he has an active infection on the little reluctant to do so anyway. No fevers, chills, nausea, or vomiting noted at this time. 01/08/17; patient seems dermatology on September 5. I suspect dermatology will want the slides from the biopsy I did sent to their pathologist. I'm not sure if there is a way we can expedite that. In any case the culture I did before I left on vacation 3 weeks ago showed Pseudomonas he was given 10 days of Cipro and per her description of her intake nurses is actually somewhat better this week although the wound is quite a bit bigger than I remember the last time I saw this. He still has 3 more days of Cipro 01/21/17; dermatology appointment tomorrow. He has completed  the ciprofloxacin for Pseudomonas. Surface of the wound looks better however he is had some deterioration in the lesions on his right leg. Meantime the left lateral leg wound we will continue with sample 01/29/17; patient had his dermatology appointment but I can't yet see that note. He is completed his antibiotics. The wound is more superficial but considerably larger in circumferential area than when he came in. This is in his left lateral calf. He also has swollen erythematous areas with superficial wounds on the right leg and small papular areas on both arms. There apparently areas in her his upper thighs and buttocks I did not look at those. Dermatology biopsied the right leg. Hopefully will have their input next week. 02/05/17; patient went back to see his dermatologist who told him that he had a "scratching problem" as well as staph. He is now on a 30 day course of doxycycline and I believe she gave him triamcinolone cream to the right leg areas to help with the itching [not exactly sure but probably triamcinolone]. She apparently looked at the left lateral leg wound although this was not rebiopsied and I think felt to be ultimately part of the same pathogenesis. He is using sample border foam and changing nevus himself. He now has a new open area on the right posterior leg which was his biopsy site I don't have any  of the dermatology notes 02/12/17; we put the patient in compression last week with SANTYL to the wound on the left leg and the biopsy. Edema is much better and the depth of the wound is now at level of skin. Area is still the same oBiopsy site on the right lateral leg we've also been using santyl with a border foam dressing and he is changing this himself. 02/19/17; Using silver alginate started last week to both the substantial left leg wound and the biopsy site on the right wound. He is tolerating compression well. Has a an appointment with his primary M.D. tomorrow wondering about  diuretics although I'm wondering if the edema problem is actually lymphedema 02/26/17; the patient has been to see his primary doctor Dr. Jerrel Ivory at [facility] our primary care. She started him on Lasix 20 mg and this seems to have helped with the edema. However we are not making substantial change with the left lateral calf wound and inflammation. The biopsy site on the right leg also looks stable but not really all that different. 03/12/17; the patient has been to see vein and vascular Dr. Lucky Cowboy. He has had venous reflux studies I have not reviewed these. I did get a call from his dermatology office. They felt that he might have pathergy based on their biopsy on his right leg which led them to look at the slides of [name], [name] ([id number]) the biopsy I did on the left leg and they wonder whether this represents pyoderma gangrenosum which was the original supposition in a man with ulcerative colitis albeit inactive for many years. They therefore recommended clobetasol and tetracycline i.e. 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 03/26/17; apparently the patient just had reflux studies not an appointment with Dr. dew. She arrives in clinic today having applied clobetasol for 2-3 weeks. He notes over the last 2-3 days excessive drainage having to change the dressing 3-4 times a day and also expanding erythema. 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 Lillia Carmel at [facility] dermatology locally who kindly saw him at my request. 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 My concern was the degree of erythema medially extending from his left leg wound which was either  inflammation from pyoderma or cellulitis. I put him on Augmentin however culture of the wound showed Pseudomonas which is quinolone sensitive. I really don't believe he has cellulitis however in view of everything I will continue and give him a course of Cipro. He is also on doxycycline as an immune modulator for the pyoderma. In addition to his original wound on the left lateral leg with surrounding erythema he has a wound on the right posterior calf which was an original biopsy site done by dermatology. 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 Saw Dr. Lillia Carmel yesterday. He has been using topical antibiotics to both wound areas his original wound on the left and the biopsies/pathergy area on the right. There is definitely some improvement in the inflammation around the wound on the right although the patient states he has increasing sensitivity of the wounds. He is on prednisone 60 and doxycycline 1 as prescribed by Dr. Lillia Carmel. He is covering the  topical antibiotic with gauze and putting this in his own compression stocks and changing this daily. He states that Dr. Lottie Rater did a culture of the left leg wound yesterday 05/07/17; pyoderma gangrenosum. The patient saw Dr. Lillia Carmel yesterday and has a follow-up with her in one month. He is still using topical antibiotics to both wounds although he can't recall exactly what type. He is still on prednisone 60 mg. Dr. Lillia Carmel stated that the doxycycline could stop if we were in agreement. 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 The right medial leg was induced by biopsy/pathergy. The area on the right is essentially healed. 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 The right medial leg has  closed and remains closed. He is still on prednisone 60. oHe tells me he missed his last dermatology appointment with Dr. Lillia Carmel but will make another appointment. He reports that her blood sugar at a recent screen in [place] was high 200's. He was 180 today. 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 I think he is going to require still much longer prednisone perhaps another 3 months before attempting to taper. In the meantime his wound is a lot better. Smaller. He is cleaning this off daily and applying topical antibiotics. When he was last in the clinic I thought about changing to [facility] and actually put in a couple of calls to dermatology although probably not during their business hours. 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 He continues on oral prednisone. He has been using triple antibiotic ointment. There is surface debris and we will transition to [facility] and have him return in 2 weeks. He has lost 30 pounds since his last appointment with lifestyle modification. He may benefit from topical steroid cream for treatment this can be considered at a later date. 08/22/17 on evaluation today patient appears to actually be doing rather well in regard to his left lateral lower extremity ulcer. He has actually been managed by Dr. Dellia Nims most recently. Patient is currently on oral steroids at this time. This seems to have been of benefit for him. Nonetheless his last visit was actually with Leah on 08/06/17. Currently he is not utilizing any topical steroid creams although this could be of benefit as well. No fevers, chills, nausea, or vomiting noted at this time. 09/05/17 on evaluation today patient appears to be doing better in regard to his left lateral lower extremity ulcer. He has been tolerating the dressing changes without complication. He is using Santyl  with good effect. Overall I'm very pleased with how things are standing at this point. Patient likewise is happy that this is doing better. 09/19/17 on evaluation today patient actually appears to be doing rather well in regard to his left lateral lower extremity ulcer. 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 He's not having any significant pain. 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 He has been tolerating the Santyl without complication and in general I feel like he's making good progress. 10/17/17  on evaluation today patient appears to be doing very well in regard to his left lateral lower surety ulcer. He has been tolerating the dressing changes without complication. There does not appear to be any evidence of infection he's alternating the Santyl and the triple antibiotic ointment every other day this seems to be doing well for him. 11/03/17 on evaluation today patient appears to be doing very well in regard to his left lateral lower extremity ulcer. He is been tolerating the dressing changes without complication which is good news. Fortunately there does not appear to be any evidence of infection which is also great news. Overall is doing excellent they are starting to taper down on the prednisone is down to 40 mg at this point it also started topical clobetasol for him. 11/17/17 on evaluation today patient appears to be doing well in regard to his left lateral lower surety ulcer. He's been tolerating the dressing changes without complication. He does note that he is having no pain, no excessive drainage or discharge, and overall he feels like things are going about how he would expect and hope they would. Overall he seems to have no evidence of infection at this time in my opinion which is  good news. 12/04/17-He is seen in follow-up evaluation for right lateral lower extremity ulcer. He has been applying topical steroid cream. Today's measurement show slight increase in size. Over the next 2 weeks we will transition to every other day Santyl and steroid cream. He has been encouraged to monitor for changes and notify clinic with any concerns 12/15/17 on evaluation today patient's left lateral motion the ulcer and fortunately is doing worse again at this point. This just since last week to this week has close to doubled in size according to the patient. I did not seeing last week's I do not have a visual to compare this to in our system was also down so we do not have all the charts and at this point. Nonetheless it does have me somewhat concerned in regard to the fact that again he was worried enough about it he has contact the dermatology that placed them back on the full strength, 50 mg a day of the prednisone that he was taken previous. He continues to alternate using clobetasol along with Santyl at this point. He is obviously somewhat frustrated. 12/22/17 on evaluation today patient appears to be doing a little worse compared to last evaluation. Unfortunately the wound is a little deeper and slightly larger than the last week's evaluation. With that being said he has made some progress in regard to the irritation surrounding at this time unfortunately despite that progress that's been made he still has a significant issue going on here. 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 [name], [name] ([id number]) For that reason I discussed with him today the possibility of perform a wound culture to ensure there's nothing overtly infected. 01/06/18 on evaluation today patient's wound is larger and deeper than previously evaluated. With that being  said it did appear that his wound was infected after my last evaluation with him. Subsequently I did end up prescribing a prescription for Bactrim DS which she has been taking and having no complication with. Fortunately there does not appear to be any evidence of infection at this point in time as far as anything spreading, no want to touch, and  overall I feel like things are showing signs of improvement. 01/13/18 on evaluation today patient appears to be even a little larger and deeper than last time. There still muscle exposed in the base of the wound. Nonetheless he does appear to be less erythematous I do believe inflammation is calming down also believe the infection looks like it's probably resolved at this time based on what I'm seeing. No fevers, chills, nausea, or vomiting noted at this time. 01/30/18 on evaluation today patient actually appears to visually look better for the most part. Unfortunately those visually this looks better he does seem to potentially have what may be an abscess in the muscle that has been noted in the central portion of the wound. This is the first time that I have noted what appears to be fluctuance in the central portion of the muscle. With that being said I'm somewhat more concerned about the fact that this might indicate an abscess formation at this location. I do believe that an ultrasound would be appropriate. This is likely something we need to try to do as soon as possible. He has been switch to mupirocin ointment and he is no longer using the steroid ointment as prescribed by dermatology he sees them again next week he's been decreased from 60 to 40 mg of prednisone. 03/09/18 on evaluation today patient actually appears to be doing a little better compared to last time I saw him. There's not as much erythema surrounding the wound itself. He I did review his most recent infectious disease note which was dated 02/24/18. He saw Dr. Michel Bickers in [place].  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 Patient is now off of antibiotics and they are continually observing this. There seems to be no change in the past two weeks in my pinion based on what the patient says and what I see today compared to what Dr. Megan Salon likely saw two weeks ago. No fevers, chills, nausea, or vomiting noted at this time. 03/23/18 on evaluation today patient's wound actually appears to be showing signs of improvement which is good news. He is currently still on the Dapsone. He is also working on tapering the prednisone to get off of this and Dr. Lottie Rater is working with him in this regard. Nonetheless overall I feel like the wound is doing well it does appear based on the infectious disease note that I reviewed from Dr. 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 I did also review the results of his ultrasound of left lower extremity which revealed there was a dentist tissue in the base of the wound without an abscess noted. 04/06/18 on evaluation today the patient's left lateral lower extremity ulcer actually appears to be doing fairly well which is excellent news. There does not appear to be any evidence of infection at this time which is also great news. Overall he still does have a significantly large ulceration although little by little he seems to be making progress. He is down to 10 mg a day of the prednisone. 04/20/18 on evaluation today patient actually appears to be doing excellent at this time in regard to his left lower extremity ulcer. He's making signs of good progress unfortunately this is taking much longer than we would really like to see  but nonetheless he is making progress. Fortunately there does not appear to be any evidence of infection at this time. No  fevers, chills, nausea, or vomiting noted at this time. The patient has not been using the Santyl due to the cost he hadn't got in this field yet. He's mainly been using the antibiotic ointment topically. Subsequently he also tells me that he really has not been scrubbing in the shower I think this would be helpful again as I told him it doesn't have to be anything too aggressive to even make it believe just enough to keep it free of some of the loose slough and biofilm on the wound surface. 05/11/18 on evaluation today patient's wound appears to be making slow but sure progress in regard to the left lateral lower extremity ulcer. He is been tolerating the dressing changes without complication. Fortunately there does not appear to be any evidence of infection at this time. He is still just using triple antibiotic ointment along with clobetasol occasionally over the area. He never got the Santyl and really does not seem to intend to in my pinion. 06/01/18 on evaluation today patient appears to be doing a little better in regard to his left lateral lower extremity ulcer. He states that overall he does not feel like he is doing as well with the Dapsone as he did with the prednisone. Nonetheless he sees his dermatologist later today and is gonna talk to them about the possibility of going back on the prednisone. Overall again I believe that the wound would be better if you would utilize Santyl but he really does not seem to be interested in going back to the [place] at this point. He has been using triple antibiotic ointment. 06/15/18 on evaluation today patient's wound actually appears to be doing about the same at this point. Fortunately there is no signs of infection at this time. He has made slight improvements although he continues to not really want to clean the wound bed at this point. He states that he just doesn't mess with it he doesn't want to cause any problems with everything else he has going  on. He has been on medication, antibiotics as prescribed by his dermatologist, for a staff infection of his lower extremities which is really drying out now and looking much better he tells me. Fortunately there is no sign of overall infection. 06/29/18 on evaluation today patient appears to be doing well in regard to his left lateral lower surety ulcer all things considering. Fortunately his staff infection seems to be greatly improved compared to previous. He has no signs of infection and this is drying up quite nicely. He is still the doxycycline for this is no longer on cental, Dapsone, or any of the other medications. His dermatologist has recommended possibility of an infusion but right now he does not want to proceed with that. 07/13/18 on evaluation today patient appears to be doing about the same in regard to his left lateral lower surety ulcer. Fortunately there's no signs of infection at this time which is great news. Unfortunately he still builds up a significant amount of Slough/biofilm of the surface of the wound he still is not really cleaning this as he should be appropriately. Again I'm able to easily with saline and gauze remove the majority of this on the surface which if you would do this at home would likely be a dramatic improvement for him as far as getting the area to improve. Nonetheless overall I  still feel like he is making progress is just very slow. I think Santyl will be of benefit for him as well. Still he has not gotten this as of this point. 07/27/18 on evaluation today patient actually appears to be doing little worse in regards of the erythema around the periwound region of the wound he also tells me that he's been having more drainage currently compared to what he was experiencing last time I saw him. He states not quite as bad as what he had because this was infected previously but nonetheless is still appears to be doing poorly. Fortunately there is no evidence  of systemic infection at this point. The patient tells me that he is not going to be able to afford the Santyl. He is still waiting to hear about the infusion therapy with his dermatologist. Apparently she wants an updated colonoscopy first. 08/10/18 on evaluation today patient appears to be doing better in regard to his left lateral lower extremity ulcer. Fortunately he is showing signs of improvement in this regard he's actually been approved for Remicade infusion's as well although this has not been scheduled as of yet. Fortunately there's no signs of active infection at this time in regard to the wound although he is having some issues with infection of the right lower extremity is been seen as dermatologist for this. Fortunately they are definitely still working with him trying to keep things under control. [name], [name] ([id number]) 09/07/18 on evaluation today patient is actually doing rather well in regard to his left lateral lower extremity ulcer. He notes these actually having some hair grow back on his extremity which is something he has not seen in years. He also tells me that the pain is really not giving them any trouble at this time which is also good news overall she is very pleased with the progress he's using a combination of the mupirocin along with the probate is all mixed. 09/21/18 on evaluation today patient actually appears to be doing fairly well all things considered in regard to his looks from the ulcer. He's been tolerating the dressing changes without complication. Fortunately there's no signs of active infection at this time which is good news he is still on all antibiotics or prevention of the staff infection. He has been on prednisone for time although he states it is gonna contact his dermatologist and see if she put them on a short course due to some irritation that he has going on currently. Fortunately there's no evidence of any overall worsening this is going very slow  I think cental would be something that would be helpful for him although he states that $50 for tube is quite expensive. He therefore is not willing to get that at this point. 10/06/18 on evaluation today patient actually appears to be doing decently well in regard to his left lateral leg ulcer. He's been tolerating the dressing changes without complication. Fortunately there's no signs of active infection at this time. Overall I'm actually rather pleased with the progress he's making although it's slow he doesn't show any signs of infection and he does seem to be making some improvement. I do believe that he may need a switch up and dressings to try to help this to heal more appropriately and quickly. 10/19/18 on evaluation today patient actually appears to be doing better in regard to his left lateral lower extremity ulcer. This is shown signs of having much less Slough buildup at this point due to the fact  he has been using the Santyl. Obviously this is very good news. The overall size of the wound is not dramatically smaller but again the appearance is. 11/02/18 on evaluation today patient actually appears to be doing quite well in regard to his lower [place] ulcer. A lot of the skin around the ulcer is actually somewhat irritating at this point this seems to be more due to the dressing causing irritation from the adhesive that anything else. Fortunately there is no signs of active infection at this time. 11/24/18 on evaluation today patient appears to be doing a little worse in regard to his overall appearance of his lower extremity ulcer. There's more erythema and warmth around the wound unfortunately. He is currently on doxycycline which he has been on for some time. With that being said I'm not sure that seems to be helping with what appears to possibly be an acute cellulitis with regard to his left lower extremity ulcer. No fevers, chills, nausea, or vomiting noted at this time. 12/08/18 on  evaluation today patient's wounds actually appears to be doing significantly better compared to his last evaluation. He has been using Santyl along with alternating tripling about appointment as well as the steroid cream seems to be doing quite well and the wound is showing signs of improvement which is excellent news. Fortunately there's no evidence of infection and in fact his culture came back negative with only normal skin flora noted. 12/21/2018 upon evaluation today patient actually appears to be doing excellent with regard to his ulcer. This is actually the best that I have seen it since have been helping to take care of him. It is both smaller as well as less slough noted on the surface of the wound and seems to be showing signs of good improvement with new skin growing from the edges. He has been using just the triamcinolone he does wonder if he can get a refill of that ointment today. 01/04/2019 upon evaluation today patient actually appears to be doing well with regard to his left lateral lower extremity ulcer. With that being said it does not appear to be that he is doing quite as well as last time as far as progression is concerned. There does not appear to be any signs of infection or significant irritation which is good news. With that being said I do believe that he may benefit from switching to a collagen based dressing based on how clean The wound appears. 01/18/2019 on evaluation today patient actually appears to be doing well with regard to his wound on the left lower extremity. He is not made a lot of progress compared to where we were previous but nonetheless does seem to be doing okay at this time which is good news. There is no signs of active infection which is also good news. My only concern currently is I do wish we can get him into utilizing the collagen dressing his insurance would not pay for the supplies that we ordered although it appears that he may be able to order this  through his supply company that he typically utilizes. This is [employer]. Nonetheless he did try to order it during the office visit today and it appears this did go through. We will see if he can get that it is a different brand but nonetheless he has collagen and I do think will be beneficial. 02/01/2019 on evaluation today patient actually appears to be doing a little worse today in regard to the overall size of  his wounds. Fortunately there is no signs of active infection at this time. That is visually. Nonetheless when this is happened before it was due to infection. For that reason were somewhat concerned about that this time as well. 02/08/2019 on evaluation today patient unfortunately appears to be doing slightly worse with regard to his wound upon evaluation today. Is measuring a little deeper and a little larger unfortunately. I am not really sure exactly what is causing this to enlarge he actually did see his dermatologist she is going to see about initiating Humira for him. Subsequently she also did do steroid injections into the wound itself in the periphery. Nonetheless still nonetheless he seems to be getting a little bit larger he is gone back to just using the steroid cream topically which I think is appropriate. I would say hold off on the collagen for the time being is definitely a good thing to do. Based on the culture results which we finally did get the final result back regarding it shows staph as the bacteria noted again that can be a normal skin bacteria based on the fact however he is having increased drainage and worsening of the wound measurement wise I would go ahead and place him on an antibiotic today I do believe for this. 02/15/2019 on evaluation today patient actually appears to be doing somewhat better in regard to his ulcer. There is no signs of worsening at this time I did review his culture results which showed evidence of Staphylococcus aureus but not MRSA. Again  this could just be more related to the normal skin bacteria although he states the drainage has slowed down quite a bit he may have had a mild infection not just colonization. And was much smaller and then since around10/04/2019 on evaluation today patient appears to be doing unfortunately worse as far as the size of the wound. I really feel like that this is steadily getting larger again it had been doing excellent right at the beginning of September we have seen a steady increase in the area of the wound it is almost 2-1/2 times the size it was on September 1. Obviously this is a bad trend this is not wanting to see. For that reason we went back to using just the topical triamcinolone cream which does seem to help with inflammation. I checked him for bacteria by way of culture and nothing showed positive there. I am considering giving him a short course of a tapering steroid Dosepak today to see if that is can be beneficial for him. The patient is in agreement with giving that a try. 03/08/2019 on evaluation today patient appears to be doing very well in comparison to last evaluation with regard to his lower extremity ulcer. This is showing signs of less inflammation and actually measuring slightly smaller compared to last time every other week over the past month and a half he has been measuring larger larger larger. Nonetheless I do believe that the issue has been inflammation the prednisone does seem to [facility], [name] ([id number]) have been beneficial for him which is good news. No fevers, chills, nausea, vomiting, or diarrhea. 03/22/2019 on evaluation today patient appears to be doing about the same with regard to his leg ulcer. He has been tolerating the dressing changes without complication. With that being said the wound seems to be mostly arrested at its current size but really is not making any progress except for when we prescribed the prednisone. He did show some signs of  dropping as far as  the overall size of the wound during that interval week. Nonetheless this is something he is not on long-term at this point and unfortunately I think he is getting need either this or else the Humira which his dermatologist has discussed try to get approval for. With that being said he will be seeing his dermatologist on the 11th of this month that is November. 04/19/2019 on evaluation today patient appears to be doing really about the same the wound is measuring slightly larger compared to last time I saw him. He has not been into the office since November 2 due to the fact that he unfortunately had Covid as that his entire family. He tells me that it was rough but they did pull-through and he seems to be doing much better. Fortunately there is no signs of active infection at this time. No fevers, chills, nausea, vomiting, or diarrhea. 05/10/2019 on evaluation today patient unfortunately appears to be doing significantly worse as compared to last time I saw him. He does tell me that he has had his first dose of Humira and actually is scheduled to get the next one in the upcoming week. With that being said he tells me also that in the past several days he has been having a lot of issues with green drainage she showed me a picture this is more blue-green in color. He is also been having issues with increased sloughy buildup and the wound does appear to be larger today. Obviously this is not the direction that we want everything to take based on the starting of his Humira. Nonetheless I think this is definitely a result of likely infection and to be honest I think this is probably Pseudomonas causing the infection based on what I am seeing. 05/24/2019 on evaluation today patient unfortunately appears to be doing significantly worse compared to his prior evaluation with me 2 weeks ago. I did review his culture results which showed that he does have Staph aureus as well as Pseudomonas noted on the culture.  Nonetheless the Levaquin that I prescribed for him does not appear to have been appropriate and in fact he tells me he is no longer experiencing the green drainage and discharge that he had at the last visit. Fortunately there is no signs of active infection at this time which is good news although the wound has significantly worsened it in fact is much deeper than it was previous. We have been utilizing up to this point triamcinolone ointment as the prescription topical of choice but at this time I really feel like that the wound is getting need to be packed in order to appropriately manage this due to the deeper nature of the wound. Therefore something along the lines of an alginate dressing may be more appropriate. 05/31/2019 upon inspection today patient's wound actually showed signs of doing poorly at this point. Unfortunately he just does not seem to be making any good progress despite what we have tried. He actually did go ahead and pick up the Cipro and start taking that as he was noticing more green drainage he had previously completed the Levaquin that I prescribed for him as well. Nonetheless he missed his appointment for the seventh last week on Wednesday with the wound care center and [facility] where his dermatologist referred him. Obviously I do think a second opinion would be helpful at this point especially in light of the fact that the patient seems to be doing so  poorly despite the fact that we have tried everything that I really know how at this point. The only thing that ever seems to have helped him in the past is when he was on high doses of continual steroids that did seem to make a difference for him. 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 06/07/2019 upon evaluation today patient unfortunately appears to be doing  worse yet again with regard to his wound. In fact I am starting to question whether or not he may have a fluid pocket in the muscle at this point based on the bulging and the soft appearance to the central portion of the muscle area. There is not anything draining from the muscle itself at this time which is good news but nonetheless the wound is expanding. I am not really seeing any results of the Humira as far as overall wound progression based on what I am seeing at this point. The patient has been referred for second opinion with regard to his wound to the [facility] wound care center by his dermatologist which I definitely am not in opposition to. Unfortunately we tried multiple dressings in the past including collagen, alginate, and at one point even Hydrofera Blue. With that being said he is never really used it for any significant amount of time due to the fact that he often complains of pain associated with these dressings and then will go back to either using the Santyl which she has done intermittently or more frequently the triamcinolone. He is also using his own compression stockings. We have wrapped him in the past but again that was something else that he really was not a big fan of. 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 He seemed to do much better when he was on the steroids although honestly I understand that the steroids are not necessarily the best medication to be on long-term obviously 06/14/2019 on evaluation today patient appears to be doing actually a little bit better with regard to the overall appearance with his leg. Unfortunately he does continue to have  issues with what appears to be some fluid underneath the muscle although he did see the wound specialty center at [facility] last week their main goals were to see about infusion therapy in place of the Humira as they feel like that is not quite strong enough. They also recommended that we continue with the treatment otherwise as we are they felt like that was appropriate and they are okay with him continuing to follow-up here with [place] in that regard. With that being said they are also sending him to the vein specialist there to see about vein stripping and if that would be of benefit for him. Subsequently they also did not really address whether or not an ultrasound of the muscle area to see if there is anything that needs to be addressed here would be appropriate or not. For that reason I discussed this with him last  week I think we may proceed down that road at this point. 06/21/2019 upon evaluation today patient's wound actually appears to be doing slightly better compared to previous evaluations. I do believe that he has made a difference with regard to the progression here with the use of oral steroids. Again in the past has been the only thing that is really calm things down. He does tell me that from [facility] is gotten a good news from there that there are no further vein stripping that is necessary at this point. I do not have that available for review today although the patient did relay this to me. He also did obtain and have the ultrasound of the wound completed which I did sign off on today. It does appear that there is no fluid collection under the muscle this is likely then just edematous tissue in general. That is also good news. Overall I still believe the inflammation is the main issue here. He did inquire about the possibility of a wound VAC again with the muscle protruding like it is I am not really sure whether the wound VAC is necessarily ideal or not. That is something we will have to consider  although I do believe he may need compression wrapping to try to help with edema control which could potentially be of benefit. 06/28/2019 on evaluation today patient appears to be doing slightly better measurement wise although this is not terribly smaller he least seems to be trending towards that direction. With that being said he still seems to have purulent drainage noted in the wound bed at this time. He has been on Levaquin followed by Cipro over the past month. Unfortunately he still seems to have some issues with active infection at this time. I did perform a culture last week in order to evaluate and see if indeed there was still anything going on. Subsequently the culture did come back showing Pseudomonas which is consistent with the drainage has been having which is blue-green in color. He also has had an odor that again was somewhat consistent with Pseudomonas as well. Long story short it appears that the culture showed an intermediate finding with regard to how well the Cipro will work for the Pseudomonas infection. Subsequently being that he does not seem to be clearing up and at best what we are doing is just keeping this at [place] I think he may need to see infectious disease to discuss IV antibiotic options. [name], [name] ([id number]) 07/05/2019 upon evaluation today patient appears to be doing okay in regard to his leg ulcer. He has been tolerating the dressing changes at this point without complication. Fortunately there is no signs of active infection at this time which is good news. No fevers, chills, nausea, vomiting, or diarrhea. With that being said he does have an appointment with infectious disease tomorrow and his primary care on Wednesday. Again the reason for the infectious disease referral was due to the fact that he did not seem to be fully resolving with the use of oral antibiotics and therefore we were thinking that IV antibiotic therapy may be necessary secondary to the  fact that there was an intermediate finding for how effective the Cipro may be. Nonetheless again he has been having a lot of purulent and even green drainage. Fortunately right now that seems to have calmed down over the past week with the reinitiation of the oral antibiotic. Nonetheless we will see what Dr. Megan Salon has to say. 07/12/2019 upon evaluation today  patient appears to be doing about the same at this point in regard to his left lower extremity ulcer. Fortunately there is no signs of active infection at this time which is good news I do believe the Levaquin has been beneficial I did review Dr. Hale Bogus note and to be honest I agree that the patient's leg does appear to be doing better currently. What we found in the past as he does not seem to really completely resolve he will stop the antibiotic and then subsequently things will revert back to having issues with blue-green drainage, increased pain, and overall worsening in general. Obviously that is the reason I sent him back to infectious disease. 07/19/2019 upon evaluation today patient appears to be doing roughly the same in size there is really no dramatic improvement. He has started back on the Levaquin at this point and though he seems to be doing okay he did still have a lot of blue/green drainage noted on evaluation today unfortunately. I think that this is still indicative more likely of a Pseudomonas infection as previously noted and again he does see Dr. Megan Salon in just a couple of days. I do not know that were really able to effectively clear this with just oral antibiotics alone based on what I am seeing currently. Nonetheless we are still continue to try to manage as best we can with regard to the patient and his wound. I do think the wrap was helpful in decreasing the edema which is excellent news. No fevers, chills, nausea, vomiting, or diarrhea. 07/26/2019 upon evaluation today patient appears to be doing slightly better with  regard to the overall appearance of the muscle there is no dark discoloration centrally. Fortunately there is no signs of active infection at this time. No fevers, chills, nausea, vomiting, or diarrhea. Patient's wound bed currently the patient did have an appointment with Dr. Megan Salon at infectious disease last week. With that being said Dr. Megan Salon the patient states was still somewhat hesitant about put him on any IV antibiotics he wanted [place] to repeat cultures today and then see where things go going forward. He does look like Dr. Megan Salon because of some improvement the patient did have with the Levaquin wanted [place] to see about repeating cultures. If it indeed grows the Pseudomonas again then he recommended a possibility of considering a PICC line placement and IV antibiotic therapy. He plans to see the patient back in 1 to 2 weeks. 08/02/2019 upon evaluation today patient appears to be doing poorly with regard to his left lower extremity. We did get the results of his culture back it shows that he is still showing evidence of Pseudomonas which is consistent with the purulent/blue-green drainage that he has currently. Subsequently the culture also shows that he now is showing resistance to the oral fluoroquinolones which is unfortunate as that was really the only thing to treat the infection prior. I do believe that he is looking like this is going require IV antibiotic therapy to get this under control. Fortunately there is no signs of systemic infection at this time which is good news. The patient does see Dr. Megan Salon tomorrow. 08/09/2019 upon evaluation today patient appears to be doing better with regard to his left lower extremity ulcer in regard to the overall appearance. He is currently on IV antibiotic therapy. As ordered by Dr. Megan Salon. Currently the patient is on ceftazidime which she is going to take for the next 2 weeks and then follow-up for 4 to 5-week  appointment with Dr. Megan Salon.  The patient started this this past Friday symptoms have not for a total of 3 days currently in full. 08/16/2019 upon evaluation today patient's wound actually does show muscle in the base of the wound but in general does appear to be much better as far as the overall evidence of infection is concerned. In fact I feel like this is for the most part cleared up he still on the IV antibiotics he has not completed the full course yet but I think he is doing much better which is excellent news. 08/23/2019 upon evaluation today patient appears to be doing about the same with regard to his wound at this point. He tells me that he still has pain unfortunately. Fortunately there is no evidence of systemic infection at this time which is great news. There is significant muscle protrusion. 09/13/19 upon evaluation today patient appears to be doing about the same in regard to his leg unfortunately. He still has a lot of drainage coming from the ulceration there is still muscle exposed. With that being said the patient's last wound culture still showed an intermediate finding with regard to the Pseudomonas he still having the bluish/green drainage as well. Overall I do not know that the wound has completely cleared of infection at this point. Fortunately there is no signs of active infection systemically at this point which is good news. 09/20/2019 upon evaluation today patient's wound actually appears to be doing about the same based on what I am seeing currently. I do not see any signs of systemic infection he still does have evidence of some local infection and drainage. He did see Dr. Megan Salon last week and Dr. Megan Salon states that he probably does need a different IV antibiotic although he does not want to put him on this until the patient begins the Remicade infusion which is actually scheduled for about 10 days out from today on 13 May. Following that time Dr. Megan Salon is good to see him back and then will  evaluate the feasibility of starting him on the IV antibiotic therapy once again at that point. I do not disagree with this plan I do believe as Dr. Megan Salon stated in his note that I reviewed today that the patient's issue is multifactorial with the pyoderma being 1 aspect of this that were hoping the Remicade will be helpful for her. In the meantime I think the gentamicin is, helping to keep things under decent okay control in regard to the ulcer. 09/27/2019 upon evaluation today patient appears to be doing about the same with regard to his wound still there is a lot of muscle exposure though he does have some hyper granulation tissue noted around the edge and actually some granulation tissue starting to form over the muscle which is actually good news. Fortunately there is no evidence of active infection which is also good news. His pain is less at this point. 5/21; this is a patient I have not seen in a long time. 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 He has a large wound on the left lateral leg with protruding muscle. He comes in the clinic today showing the same area on his left medial ankle. He says there is been a spot there for some time although we have not previously defined this. Today he has a clearly defined area with slight amount of skin breakdown surrounded by raised areas with a purplish hue in color. This is not painful he  says it is irritated. 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 He still has muscle protruding from the lateral portion of his left leg but fortunately the new area that they were concerned about at his last visit does not appear to have opened at this point. He is currently on Remicade infusions and seems to be doing better in my opinion in fact the wound itself seems to be overall much better. The purplish  discoloration that he did have seems to have resolved and I think that is a good sign that hopefully the Remicade is doing its job. He does have some biofilm noted over the surface of the wound. 11/01/2019 on evaluation today patient's wound actually appears to be doing excellent at this time. Fortunately there is no evidence of active infection and overall I feel like he is making great progress. The Remicade seems to be due excellent job in my opinion. [name], [name] ([id number]) 11/08/19 evaluation today vision actually appears to be doing quite well with regard to his weight ulcer. He's been tolerating dressing changes without complication. Fortunately there is no evidence of infection. No fevers, chills, nausea, or vomiting noted at this time. Overall states that is having more itching than pain which is actually a good sign in my opinion. 12/13/2019 upon evaluation today patient appears to be doing well today with regard to his wound. He has been tolerating the dressing changes without complication. Fortunately there is no sign of active infection at this time. No fevers, chills, nausea, vomiting, or diarrhea. Overall I feel like the infusion therapy has been very beneficial for him. 01/06/2020 on evaluation today patient appears to be doing well with regard to his wound. This is measuring smaller and actually looks to be doing better. Fortunately there is no signs of active infection at this point. No fevers, chills, nausea, vomiting, or diarrhea. With that being said he does still have the blue-green drainage but this does not seem to be causing any significant issues currently. He has been using the gentamicin that does seem to be keeping things under decent control at this point. He goes later this morning for his next infusion therapy for the pyoderma which seems to also be very beneficial. 02/07/2020 on evaluation today patient appears to be doing about the same in regard to his wounds  currently. Fortunately there is no signs of active infection systemically he does still have evidence of local infection still using gentamicin. He also is showing some signs of improvement albeit slowly I do feel like we are making some progress here. 02/21/2020 upon evaluation today patient appears to be making some signs of improvement the wound is measuring a little bit smaller which is great news and overall I am very pleased with where he stands currently. He is going to be having infusion therapy treatment on the 15th of this month. Fortunately there is no signs of active infection at this time. 03/13/2020 I do believe patient's wound is actually showing some signs of improvement here which is great news. He has continue with the infusion therapy through rheumatology/dermatology at [facility]. That does seem to be beneficial. I still think he gets as much benefit from this as he did from the prednisone initially but nonetheless obviously this is less harsh on his body that the prednisone as far as they are concerned. 03/31/2020 on evaluation today patient's wound actually showing signs of some pretty  good improvement in regard to the overall appearance of the wound bed. There is still muscle exposed though he does have some epithelial growth around the edges of the wound. Fortunately there is no signs of active infection at this time. No fevers, chills, nausea, vomiting, or diarrhea. 04/24/2020 upon evaluation today patient appears to be doing about the same in regard to his leg ulcer. He has been tolerating the dressing changes without complication. Fortunately there is no signs of active infection at this time. No fevers, chills, nausea, vomiting, or diarrhea. With that being said he still has a lot of irritation from the bandaging around the edges of the wound. We did discuss today the possibility of a referral to plastic surgery. 05/22/2020 on evaluation today patient appears to be doing well with  regard to his wounds all things considered. He has not been able to get the Chantix apparently there is a recall nurse that I was unaware of put out by [employer] involuntarily. Nonetheless for now I am and I have to do some research into what may be the best option for him to help with quitting in regard to smoking and we discussed that today. 06/26/2020 upon evaluation today patient appears to be doing well with regard to his wound from the standpoint of infection I do not see any signs of infection at this point. With that being said unfortunately he is still continuing to have issues with muscle exposure and again he is not having a whole lot of new skin growth unfortunately. There does not appear to be any signs of active infection at this time. No fevers, chills, nausea, vomiting, or diarrhea. 07/10/2020 upon evaluation today patient appears to be doing a little bit more poorly currently compared to where he was previous. I am concerned currently about an active infection that may be getting worse especially in light of the increased size and tenderness of the wound bed. No fevers, chills, nausea, vomiting, or diarrhea. 07/24/2020 upon evaluation today patient appears to be doing poorly in regard to his leg ulcer. He has been tolerating the dressing changes without complication but unfortunately is having a lot of discomfort. 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 Subsequently I think he is going require possibly IV antibiotics to get this under control. I am very concerned about the severity of his infection and the amount of discomfort he is having. 07/31/2020 upon evaluation today patient appears to be doing about the same in regard to his leg wound. He did see Dr. Megan Salon and Dr. Megan Salon is actually going to start him on IV antibiotics. He goes for the PICC line tomorrow. With that being said there do not have that run for  2 weeks and then see how things are doing and depending on how he is progressing they may extend that a little longer. Nonetheless I am glad this is getting ready to be in place and definitely feel it may help the patient. In the meantime is been using mainly triamcinolone to the wound bed has an anti-inflammatory. 08/07/2020 on evaluation today patient appears to be doing well with regard to his wound compared even last week. In the interim he has gotten the PICC line placed and overall this seems to be doing excellent. There does not appear to be any evidence of infection which is great news systemically although locally of course has had the infection this appears to be improving with the use of  the antibiotics. 08/14/2020 upon evaluation today patient's wound actually showing signs of excellent improvement. Overall the irritation has significantly improved the drainage is back down to more of a normal level and his pain is really pretty much nonexistent compared to what it was. Obviously I think that this is significantly improved secondary to the IV antibiotic therapy which has made all the difference in the world. Again he had a resistant form of Pseudomonas for which oral antibiotics just was not cutting it. Nonetheless I do think that still we need to consider the possibility of a surgical closure for this wound is been open so long and to be honest with muscle exposed I think this can be very hard to get this to close outside of this although definitely were still working to try to do what we can in that regard. 08/21/2020 upon evaluation today patient appears to be doing very well with regard to his wounds on the left lateral lower extremity/calf area. Fortunately there does not appear to be signs of active infection which is great news and overall very pleased with where things stand today. He is actually wrapping up his treatment with IV antibiotics tomorrow. After that we will see where  things go from there. 08/28/2020 upon evaluation today patient appears to be doing decently well with regard to his leg ulcer. There does not appear to be any signs of active infection which is great news and overall very pleased with where things stand today. No fevers, chills, nausea, vomiting, or diarrhea. Electronic Signature(s) RITVIK, MCZEAL ([id number]) Signed: 08/28/2020 9:47:22 AM By: Worthy Keeler PA-C Entered By: Worthy Keeler on 08/28/2020 09:47:22 [name], [name] ([id number]) -------------------------------------------------------------------------------- Physical Exam Details Patient Name: [name] Date of Service: 08/28/2020 9:00 AM Medical Record Number: [id number] Patient Account Number: [id number] Date of Birth/Sex: March 05, 1981 (39 y.o. M) Treating RN: Carlene Coria Primary Care Provider: [name] Other Clinician: Jeanine Luz Referring Provider: [name] Treating Provider/Extender: [name] Cliche in Treatment: 16 Constitutional Well-nourished and well-hydrated in no acute distress. Respiratory normal breathing without difficulty. Psychiatric this patient is able to make decisions and demonstrates good insight into disease process. Alert and Oriented x 3. pleasant and cooperative. Notes Upon inspection patient's wound bed showed signs of good granulation epithelization at this time. There does not appear to be any evidence of active infection at this time which is great news and overall I am extremely pleased with where things stand. No fevers, chills, nausea, vomiting, or diarrhea. Electronic Signature(s) Signed: 08/28/2020 9:47:41 AM By: Worthy Keeler PA-C Entered By: Worthy Keeler on 08/28/2020 09:47:41 JACORY, [name] ([id number]) -------------------------------------------------------------------------------- Physician Orders Details Patient Name: [name] Date of Service: 08/28/2020 9:00 AM Medical Record Number: [id number] Patient  Account Number: [id number] Date of Birth/Sex: [birth year] (39 y.o. M) Treating RN: Carlene Coria Primary Care Provider: [name] Other Clinician: Jeanine Luz Referring Provider: [name] Treating Provider/Extender: [name] Cliche in Treatment: [phone] Verbal / Phone Orders: No Diagnosis Coding ICD-10 Coding Code Description [id number] Non-pressure chronic ulcer of left calf with fat layer exposed L88 Pyoderma gangrenosum L97.321 Non-pressure chronic ulcer of left ankle limited to breakdown of skin I87.2 Venous insufficiency (chronic) (peripheral) L03.116 Cellulitis of left lower limb F17.208 Nicotine dependence, unspecified, with other nicotine-induced disorders Follow-up Appointments o Return Appointment in 2 weeks. Edema Control - Lymphedema / Segmental Compressive Device / Other o Tubigrip double layer applied - F Wound Treatment Wound #1 - Lower Leg Wound Laterality: Left, Lateral Cleanser: Soap and Water  1 x Per Day/30 Days Discharge Instructions: Gently cleanse wound with antibacterial soap, rinse and pat dry prior to dressing wounds Topical: Triamcinolone Acetonide Cream, 0.1%, 15 (g) tube 1 x Per Day/30 Days Discharge Instructions: to wound bed , lightly Secondary Dressing: Mepilex Border Flex, 6x6 (in/in) 1 x Per Day/30 Days Discharge Instructions: Apply to wound as directed. Do not cut. Add-Ons: tubi grip F (Generic) 1 x Per Day/30 Days Discharge Instructions: apply double layer to lower leg Electronic Signature(s) Signed: 08/28/2020 4:31:50 PM By: Worthy Keeler PA-C Signed: 08/28/2020 5:13:28 PM By: Carlene Coria RN Entered By: Carlene Coria on 08/28/2020 09:48:16 Lowe, Grason ([id number]) -------------------------------------------------------------------------------- Problem List Details Patient Name: [name] Date of Service: 08/28/2020 9:00 AM Medical Record Number: [id number] Patient Account Number: [id number] Date of Birth/Sex: 1981/02/09 (40  y.o. M) Treating RN: Carlene Coria Primary Care Provider: [name] Other Clinician: Jeanine Luz Referring Provider: [name] Treating Provider/Extender: [name] Cliche in Treatment: 194 Active Problems ICD-10 Encounter Code Description Active Date MDM Diagnosis L97.222 Non-pressure chronic ulcer of left calf with fat layer exposed 12/04/2016 No Yes L88 Pyoderma gangrenosum 03/26/2017 No Yes L97.321 Non-pressure chronic ulcer of left ankle limited to breakdown of skin 10/08/2019 No Yes I87.2 Venous insufficiency (chronic) (peripheral) 12/04/2016 No Yes L03.116 Cellulitis of left lower limb 05/24/2019 No Yes F17.208 Nicotine dependence, unspecified, with other nicotine-induced disorders 04/24/2020 No Yes Inactive Problems ICD-10 Code Description Active Date Inactive Date L97.213 Non-pressure chronic ulcer of right calf with necrosis of muscle 04/02/2017 04/02/2017 Resolved Problems Electronic Signature(s) Signed: 08/28/2020 9:15:26 AM By: Worthy Keeler PA-C Entered By: Worthy Keeler on 08/28/2020 09:15:26 Kisner, [name] ([id number]) -------------------------------------------------------------------------------- Progress Note Details Patient Name: [name] Date of Service: 08/28/2020 9:00 AM Medical Record Number: [id number] Patient Account Number: [id number] Date of Birth/Sex: June 17, 1980 (39 y.o. M) Treating RN: Carlene Coria Primary Care Provider: [name] Other Clinician: Jeanine Luz Referring Provider: [name] Treating Provider/Extender: [name] Cliche in Treatment: 194 Subjective Chief Complaint Information obtained from Patient He is here in follow up evaluation for LLE pyoderma ulcer History of Present Illness (HPI) 12/04/16; 40 year old man who comes into the clinic today for review of a wound on the posterior left calf. He tells me that is been there for about a year. He is not a diabetic he does smoke half a pack per day. He  was seen in the ER on 11/20/16 felt to have cellulitis around the wound and was given clindamycin. An x-ray did not show osteomyelitis. The patient initially tells me that he has a milk allergy that sets off a pruritic itching rash on his lower legs which she scratches incessantly and he thinks that's what may have set up the wound. He has been using various topical antibiotics and ointments without any effect. He works in a [employer]ing [employer] and is on his feet all day. He does not have a prior history of wounds however he does have the rash on both lower legs the right arm and the ventral aspect of his left arm. These are excoriations and clearly have had scratching however there are of macular looking areas on both legs including a substantial larger area on the right leg. This does not have an underlying open area. There is no blistering. The patient tells me that 2 years ago in [place] in response to the rash on his legs he saw a dermatologist who told him he had a condition which may be pyoderma gangrenosum although I may be putting  words into his mouth. He seemed to recognize this. 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 He is not in any treatment for this. He's had no recent travel 12/11/16; the patient arrives today with his wound and roughly the same condition we've been using silver alginate this is a deep punched out wound with some surrounding erythema but no tenderness. Biopsy I did did not show confirmed pyoderma gangrenosum suggested nonspecific inflammation and vasculitis but does not provide an actual description of what was seen by the pathologist. I'm really not able to understand this We have also received information from the patient's dermatologist in [place] notes from April 2016. This was a doctor Agarwal-antal. 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 He was prescribed topical steroid high potency under occlusion which helped  but at this point the patient did not have a deep punched out wound. 12/18/16; the patient's wound is larger in terms of surface area however this surface looks better and there is less depth. The surrounding erythema also is better. The patient states that the wrap we put on came off 2 days ago when he has been using his compression stockings. He we are in the process of getting a dermatology consult. 12/26/16 on evaluation today patient's left lower extremity wound shows evidence of infection with surrounding erythema noted. He has been tolerating the dressing changes but states that he has noted more discomfort. There is a larger area of erythema surrounding the wound. No fevers, chills, nausea, or vomiting noted at this time. With that being said the wound still does have slough covering the surface. He is not allergic to any medication that he is aware of at this point. In regard to his right lower extremity he had several regions that are erythematous and pruritic he wonders if there's anything we can do to help that. 01/02/17 I reviewed patient's wound culture which was obtained his visit last week. He was placed on doxycycline at that point. Unfortunately that does not appear to be an antibiotic that would likely help with the situation however the pseudomonas noted on culture is sensitive to Cipro. Also unfortunately patient's wound seems to have a large compared to last week's evaluation. Not severely so but there are definitely increased measurements in general. He is continuing to have discomfort as well he writes this to be a seven out of 10. In fact he would prefer me not to perform any debridement today due to the fact that he is having discomfort and considering he has an active infection on the little reluctant to do so anyway. No fevers, chills, nausea, or vomiting noted at this time. 01/08/17; patient seems dermatology on September 5. I suspect dermatology will want the slides from the  biopsy I did sent to their pathologist. I'm not sure if there is a way we can expedite that. In any case the culture I did before I left on vacation 3 weeks ago showed Pseudomonas he was given 10 days of Cipro and per her description of her intake nurses is actually somewhat better this week although the wound is quite a bit bigger than I remember the last time I saw this. He still has 3 more days of Cipro 01/21/17; dermatology appointment tomorrow. He has completed the ciprofloxacin for Pseudomonas. Surface of the wound looks better however he is had some deterioration in the lesions on his right leg. Meantime the left lateral leg wound we will continue with  sample 01/29/17; patient had his dermatology appointment but I can't yet see that note. He is completed his antibiotics. The wound is more superficial but considerably larger in circumferential area than when he came in. This is in his left lateral calf. He also has swollen erythematous areas with superficial wounds on the right leg and small papular areas on both arms. There apparently areas in her his upper thighs and buttocks I did not look at those. Dermatology biopsied the right leg. Hopefully will have their input next week. 02/05/17; patient went back to see his dermatologist who told him that he had a "scratching problem" as well as staph. He is now on a 30 day course of doxycycline and I believe she gave him triamcinolone cream to the right leg areas to help with the itching [not exactly sure but probably triamcinolone]. She apparently looked at the left lateral leg wound although this was not rebiopsied and I think felt to be ultimately part of the same pathogenesis. He is using sample border foam and changing nevus himself. He now has a new open area on the right posterior leg which was his biopsy site I don't have any of the dermatology notes 02/12/17; we put the patient in compression last week with SANTYL to the wound on the left leg  and the biopsy. Edema is much better and the depth of the wound is now at level of skin. Area is still the same Biopsy site on the right lateral leg we've also been using santyl with a border foam dressing and he is changing this himself. 02/19/17; Using silver alginate started last week to both the substantial left leg wound and the biopsy site on the right wound. He is tolerating compression well. Has a an appointment with his primary M.D. tomorrow wondering about diuretics although I'm wondering if the edema problem is actually lymphedema [name], [name] ([id number]) 02/26/17; the patient has been to see his primary doctor Dr. Jerrel Ivory at [facility] our primary care. She started him on Lasix 20 mg and this seems to have helped with the edema. However we are not making substantial change with the left lateral calf wound and inflammation. The biopsy site on the right leg also looks stable but not really all that different. 03/12/17; the patient has been to see vein and vascular Dr. Lucky Cowboy. He has had venous reflux studies I have not reviewed these. I did get a call from his dermatology office. 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 They therefore recommended clobetasol and tetracycline i.e. 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 03/26/17; apparently the patient just had reflux studies not an appointment with Dr. dew. She arrives in clinic today having applied clobetasol for 2-3 weeks. He notes over the last 2-3 days excessive drainage having to change the dressing 3-4 times a day and also expanding erythema. He states the expanding erythema seems to come and go and was last this red was earlier in the month.he is on  doxycycline 150 mg twice a day as an anti-inflammatory systemic therapy for possible pyoderma gangrenosum along with the topical clobetasol 04/02/17; the patient was seen last week by Dr. Lillia Carmel at [facility] dermatology locally who kindly saw him at my  request. 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 My concern was the degree of erythema medially extending from his left leg wound which was either inflammation from pyoderma or cellulitis. I put him on Augmentin however culture of the wound showed Pseudomonas which is quinolone sensitive. I really don't believe he has cellulitis however in view of everything I will continue and give him a course of Cipro. He is also on doxycycline as an immune modulator for the pyoderma. In addition to his original wound on the left lateral leg with surrounding erythema he has a wound on the right posterior calf which was an original biopsy site done by dermatology. 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 Saw Dr. Lillia Carmel yesterday. He has been using topical antibiotics to both wound areas his original wound on the left and the biopsies/pathergy area on the right. There is definitely some improvement in the inflammation around the wound on the right although the patient states he has increasing sensitivity of the wounds. He is on prednisone 60 and doxycycline 1 as prescribed by Dr. Lillia Carmel. He is covering the topical antibiotic with gauze and putting this in his own compression stocks and changing this daily. He states that Dr. Lottie Rater did a culture of the left leg wound yesterday 05/07/17; pyoderma gangrenosum. The patient saw Dr. Lillia Carmel yesterday and has a follow-up with her in one month. He is still using topical antibiotics to both wounds although he can't recall exactly what type. He is still on prednisone 60 mg. Dr. Lillia Carmel stated  that the doxycycline could stop if we were in agreement. 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 The right medial leg was induced by biopsy/pathergy. The area on the right is essentially healed. 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 The right medial leg has closed and remains closed. He is still on prednisone 60. He tells me he missed his last dermatology appointment with Dr. Lillia Carmel but will make another appointment. He reports that her blood sugar at a recent screen in [place] was high 200's. He was 180 today. 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 I think he is going to require still much longer prednisone perhaps another 3 months before attempting to taper. In the meantime his wound is a lot better. Smaller. He is cleaning this off daily and applying topical antibiotics. When he was last in the clinic I thought about changing to [facility] and actually put in a couple of calls to dermatology although probably not during their business hours. 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 He continues on oral prednisone. He has been using triple antibiotic ointment. There is surface debris and we will transition to [facility] and have him return in 2 weeks. He has lost 30 pounds since his last appointment with lifestyle modification. He may benefit from topical steroid cream for treatment this can be considered at a later date. 08/22/17 on evaluation today patient appears to actually be doing rather well in regard to his left lateral lower extremity ulcer. He has actually been managed by Dr. Dellia Nims most recently. Patient  is currently on oral steroids at this time.  This seems to have been of benefit for him. Nonetheless his last visit was actually with Leah on 08/06/17. Currently he is not utilizing any topical steroid creams although this could be of benefit as well. No fevers, chills, nausea, or vomiting noted at this time. 09/05/17 on evaluation today patient appears to be doing better in regard to his left lateral lower extremity ulcer. He has been tolerating the dressing changes without complication. He is using Santyl with good effect. Overall I'm very pleased with how things are standing at this point. Patient likewise is happy that this is doing better. 09/19/17 on evaluation today patient actually appears to be doing rather well in regard to his left lateral lower extremity ulcer. 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 He's not having any significant pain. 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 He has been tolerating the Santyl without complication and in general I feel like he's making good progress. 10/17/17 on evaluation today patient appears to be doing very well in regard to his left lateral lower surety ulcer. He has been tolerating the dressing changes without complication. There does not appear to be any evidence of infection he's alternating the Santyl and the triple antibiotic ointment every other day this seems to be doing well for him. 11/03/17 on evaluation today patient appears to be doing very well in regard to his left lateral lower extremity ulcer. He is been tolerating the dressing changes without complication which is good news. Fortunately there does not appear to be any evidence of infection which is also great news. Overall is doing excellent they are starting to taper down on the prednisone is down to 40 mg at this point it also started topical  clobetasol for him. 11/17/17 on evaluation today patient appears to be doing well in regard to his left lateral lower surety ulcer. He's been tolerating the dressing changes without complication. He does note that he is having no pain, no excessive drainage or discharge, and overall he feels like things are going about how he would expect and hope they would. Overall he seems to have no evidence of infection at this time in my opinion which is good news. 12/04/17-He is seen in follow-up evaluation for right lateral lower extremity ulcer. He has been applying topical steroid cream. Today's measurement show slight increase in size. Over the next 2 weeks we will transition to every other day Santyl and steroid cream. He has been encouraged to monitor for changes and notify clinic with any concerns 12/15/17 on evaluation today patient's left lateral motion the ulcer and fortunately is doing worse again at this point. This just since last week to this week has close to doubled in size according to the patient. I did not seeing last week's I do not have a visual to compare this to in our system was also down so we do not have all the charts and at this point. Nonetheless it does have me somewhat concerned in regard to the fact that again he was worried enough about it he has contact the dermatology that placed them back on the full strength, 50 mg a day of the prednisone that he was taken previous. He continues to alternate using clobetasol along with Santyl at this point. He  is obviously somewhat frustrated. [name], [name] ([id number]) 12/22/17 on evaluation today patient appears to be doing a little worse compared to last evaluation. Unfortunately the wound is a little deeper and slightly larger than the last week's evaluation. With that being said he has made some progress in regard to the irritation surrounding at this time unfortunately despite that progress that's been made he still has a significant issue  going on here. 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 For that reason I discussed with him today the possibility of perform a wound culture to ensure there's nothing overtly infected. 01/06/18 on evaluation today patient's wound is larger and deeper than previously evaluated. With that being said it did appear that his wound was infected after my last evaluation with him. Subsequently I did end up prescribing a prescription for Bactrim DS which she has been taking and having no complication with. Fortunately there does not appear to be any evidence of infection at this point in time as far as anything spreading, no want to touch, and overall I feel like things are showing signs of improvement. 01/13/18 on evaluation today patient appears to be even a little larger and deeper than last time. There still muscle exposed in the base of the wound. Nonetheless he does appear to be less erythematous I do believe inflammation is calming down also believe the infection looks like it's probably resolved at this time based on what I'm seeing. No fevers, chills, nausea, or vomiting noted at this time. 01/30/18 on evaluation today patient actually appears to visually look better for the most part. Unfortunately those visually this looks better he does seem to potentially have what may be an abscess in the muscle that has been noted in the central portion of the wound. This is the first time that I have noted what appears to be fluctuance in the central portion of the muscle. With that being said I'm somewhat more concerned about the fact that this might indicate an abscess formation at this location. I do believe that an ultrasound would be appropriate. This is likely something we need to try to do as soon as possible. He has been switch to mupirocin ointment and  he is no longer using the steroid ointment as prescribed by dermatology he sees them again next week he's been decreased from 60 to 40 mg of prednisone. 03/09/18 on evaluation today patient actually appears to be doing a little better compared to last time I saw him. There's not as much erythema surrounding the wound itself. He I did review his most recent infectious disease note which was dated 02/24/18. He saw Dr. Michel Bickers in [place]. 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 Patient is now off of antibiotics and they are continually observing this. There seems to be no change in the past two weeks in my pinion based on what the patient says and what I see today compared to what Dr. Megan Salon likely saw two weeks ago. No fevers, chills, nausea, or vomiting noted at this time. 03/23/18 on evaluation today patient's wound actually appears to be showing signs of improvement which is good news. He is currently still on the Dapsone. He is also working on tapering the prednisone to get off of this and  Dr. Lottie Rater is working with him in this regard. Nonetheless overall I feel like the wound is doing well it does appear based on the infectious disease note that I reviewed from Dr. 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 I did also review the results of his ultrasound of left lower extremity which revealed there was a dentist tissue in the base of the wound without an abscess noted. 04/06/18 on evaluation today the patient's left lateral lower extremity ulcer actually appears to be doing fairly well which is excellent news. There does not appear to be any evidence of infection at this time which is also great news. Overall he still does have a significantly large ulceration although little by  little he seems to be making progress. He is down to 10 mg a day of the prednisone. 04/20/18 on evaluation today patient actually appears to be doing excellent at this time in regard to his left lower extremity ulcer. He's making signs of good progress unfortunately this is taking much longer than we would really like to see but nonetheless he is making progress. Fortunately there does not appear to be any evidence of infection at this time. No fevers, chills, nausea, or vomiting noted at this time. The patient has not been using the Santyl due to the cost he hadn't got in this field yet. He's mainly been using the antibiotic ointment topically. Subsequently he also tells me that he really has not been scrubbing in the shower I think this would be helpful again as I told him it doesn't have to be anything too aggressive to even make it believe just enough to keep it free of some of the loose slough and biofilm on the wound surface. 05/11/18 on evaluation today patient's wound appears to be making slow but sure progress in regard to the left lateral lower extremity ulcer. He is been tolerating the dressing changes without complication. Fortunately there does not appear to be any evidence of infection at this time. He is still just using triple antibiotic ointment along with clobetasol occasionally over the area. He never got the Santyl and really does not seem to intend to in my pinion. 06/01/18 on evaluation today patient appears to be doing a little better in regard to his left lateral lower extremity ulcer. He states that overall he does not feel like he is doing as well with the Dapsone as he did with the prednisone. Nonetheless he sees his dermatologist later today and is gonna talk to them about the possibility of going back on the prednisone. Overall again I believe that the wound would be better if you would utilize Santyl but he really does not seem to be interested in going back to the [place] at  this point. He has been using triple antibiotic ointment. 06/15/18 on evaluation today patient's wound actually appears to be doing about the same at this point. Fortunately there is no signs of infection at this time. He has made slight improvements although he continues to not really want to clean the wound bed at this point. He states that he just doesn't mess with it he doesn't want to cause any problems with everything else he has going on. He has been on medication, antibiotics as prescribed by his dermatologist, for a staff infection of his lower extremities which is really drying out now and looking much better  he tells me. Fortunately there is no sign of overall infection. 06/29/18 on evaluation today patient appears to be doing well in regard to his left lateral lower surety ulcer all things considering. Fortunately his staff infection seems to be greatly improved compared to previous. He has no signs of infection and this is drying up quite nicely. He is still the doxycycline for this is no longer on cental, Dapsone, or any of the other medications. His dermatologist has recommended possibility of an infusion but right now he does not want to proceed with that. 07/13/18 on evaluation today patient appears to be doing about the same in regard to his left lateral lower surety ulcer. Fortunately there's no signs of infection at this time which is great news. Unfortunately he still builds up a significant amount of Slough/biofilm of the surface of the wound he still is not really cleaning this as he should be appropriately. Again I'm able to easily with saline and gauze remove the majority of this on the surface which if you would do this at home would likely be a dramatic improvement for him as far as getting the area to improve. Nonetheless overall I still feel like he is making progress is just very slow. I think Santyl will be of benefit for him as well. Still he has not gotten this as of this  point. 07/27/18 on evaluation today patient actually appears to be doing little worse in regards of the erythema around the periwound region of the wound he also tells me that he's been having more drainage currently compared to what he was experiencing last time I saw him. He states not quite as bad as what he had because this was infected previously but nonetheless is still appears to be doing poorly. Fortunately there is no evidence of systemic infection at this point. The patient tells me that he is not going to be able to afford the Santyl. He is still waiting to hear about the infusion therapy with his dermatologist. Apparently she wants an updated colonoscopy first. [name], [name] ([id number]) 08/10/18 on evaluation today patient appears to be doing better in regard to his left lateral lower extremity ulcer. Fortunately he is showing signs of improvement in this regard he's actually been approved for Remicade infusion's as well although this has not been scheduled as of yet. Fortunately there's no signs of active infection at this time in regard to the wound although he is having some issues with infection of the right lower extremity is been seen as dermatologist for this. Fortunately they are definitely still working with him trying to keep things under control. 09/07/18 on evaluation today patient is actually doing rather well in regard to his left lateral lower extremity ulcer. He notes these actually having some hair grow back on his extremity which is something he has not seen in years. He also tells me that the pain is really not giving them any trouble at this time which is also good news overall she is very pleased with the progress he's using a combination of the mupirocin along with the probate is all mixed. 09/21/18 on evaluation today patient actually appears to be doing fairly well all things considered in regard to his looks from the ulcer. He's been tolerating the dressing changes  without complication. Fortunately there's no signs of active infection at this time which is good news he is still on all antibiotics or prevention of the staff infection. He has been on prednisone for time  although he states it is gonna contact his dermatologist and see if she put them on a short course due to some irritation that he has going on currently. Fortunately there's no evidence of any overall worsening this is going very slow I think cental would be something that would be helpful for him although he states that $50 for tube is quite expensive. He therefore is not willing to get that at this point. 10/06/18 on evaluation today patient actually appears to be doing decently well in regard to his left lateral leg ulcer. He's been tolerating the dressing changes without complication. Fortunately there's no signs of active infection at this time. Overall I'm actually rather pleased with the progress he's making although it's slow he doesn't show any signs of infection and he does seem to be making some improvement. I do believe that he may need a switch up and dressings to try to help this to heal more appropriately and quickly. 10/19/18 on evaluation today patient actually appears to be doing better in regard to his left lateral lower extremity ulcer. This is shown signs of having much less Slough buildup at this point due to the fact he has been using the [employer]. Obviously this is very good news. The overall size of the wound is not dramatically smaller but again the appearance is. 11/02/18 on evaluation today patient actually appears to be doing quite well in regard to his lower [place] ulcer. A lot of the skin around the ulcer is actually somewhat irritating at this point this seems to be more due to the dressing causing irritation from the adhesive that anything else. Fortunately there is no signs of active infection at this time. 11/24/18 on evaluation today patient appears to be doing a little  worse in regard to his overall appearance of his lower extremity ulcer. There's more erythema and warmth around the wound unfortunately. He is currently on doxycycline which he has been on for some time. With that being said I'm not sure that seems to be helping with what appears to possibly be an acute cellulitis with regard to his left lower extremity ulcer. No fevers, chills, nausea, or vomiting noted at this time. 12/08/18 on evaluation today patient's wounds actually appears to be doing significantly better compared to his last evaluation. He has been using Santyl along with alternating tripling about appointment as well as the steroid cream seems to be doing quite well and the wound is showing signs of improvement which is excellent news. Fortunately there's no evidence of infection and in fact his culture came back negative with only normal skin flora noted. 12/21/2018 upon evaluation today patient actually appears to be doing excellent with regard to his ulcer. This is actually the best that I have seen it since have been helping to take care of him. It is both smaller as well as less slough noted on the surface of the wound and seems to be showing signs of good improvement with new skin growing from the edges. He has been using just the triamcinolone he does wonder if he can get a refill of that ointment today. 01/04/2019 upon evaluation today patient actually appears to be doing well with regard to his left lateral lower extremity ulcer. With that being said it does not appear to be that he is doing quite as well as last time as far as progression is concerned. There does not appear to be any signs of infection or significant irritation which is good news. With  that being said I do believe that he may benefit from switching to a collagen based dressing based on how clean The wound appears. 01/18/2019 on evaluation today patient actually appears to be doing well with regard to his wound on the  left lower extremity. He is not made a lot of progress compared to where we were previous but nonetheless does seem to be doing okay at this time which is good news. There is no signs of active infection which is also good news. My only concern currently is I do wish we can get him into utilizing the collagen dressing his insurance would not pay for the supplies that we ordered although it appears that he may be able to order this through his supply company that he typically utilizes. This is [employer]. Nonetheless he did try to order it during the office visit today and it appears this did go through. We will see if he can get that it is a different brand but nonetheless he has collagen and I do think will be beneficial. 02/01/2019 on evaluation today patient actually appears to be doing a little worse today in regard to the overall size of his wounds. Fortunately there is no signs of active infection at this time. That is visually. Nonetheless when this is happened before it was due to infection. For that reason were somewhat concerned about that this time as well. 02/08/2019 on evaluation today patient unfortunately appears to be doing slightly worse with regard to his wound upon evaluation today. Is measuring a little deeper and a little larger unfortunately. I am not really sure exactly what is causing this to enlarge he actually did see his dermatologist she is going to see about initiating Humira for him. Subsequently she also did do steroid injections into the wound itself in the periphery. Nonetheless still nonetheless he seems to be getting a little bit larger he is gone back to just using the steroid cream topically which I think is appropriate. I would say hold off on the collagen for the time being is definitely a good thing to do. Based on the culture results which we finally did get the final result back regarding it shows staph as the bacteria noted again that can be a normal skin bacteria  based on the fact however he is having increased drainage and worsening of the wound measurement wise I would go ahead and place him on an antibiotic today I do believe for this. 02/15/2019 on evaluation today patient actually appears to be doing somewhat better in regard to his ulcer. There is no signs of worsening at this time I did review his culture results which showed evidence of Staphylococcus aureus but not MRSA. Again this could just be more related to the normal skin bacteria although he states the drainage has slowed down quite a bit he may have had a mild infection not just colonization. And was much smaller and then since around10/04/2019 on evaluation today patient appears to be doing unfortunately worse as far as the size of the wound. I really feel like that this is steadily getting larger again it had been doing excellent right at the beginning of September we have seen a steady increase in the area of the wound it is almost 2-1/2 times the size it was on September 1. Obviously this is a bad trend this is not wanting to see. For that reason we went back to using just the topical triamcinolone cream which does seem to help  with inflammation. I checked him for bacteria by way of culture and nothing showed positive there. I am considering giving him a short course of a tapering steroid [name] [name], Daveion ([id number]) today to see if that is can be beneficial for him. The patient is in agreement with giving that a try. 03/08/2019 on evaluation today patient appears to be doing very well in comparison to last evaluation with regard to his lower extremity ulcer. This is showing signs of less inflammation and actually measuring slightly smaller compared to last time every other week over the past month and a half he has been measuring larger larger larger. Nonetheless I do believe that the issue has been inflammation the prednisone does seem to have been beneficial for him which is good  news. No fevers, chills, nausea, vomiting, or diarrhea. 03/22/2019 on evaluation today patient appears to be doing about the same with regard to his leg ulcer. He has been tolerating the dressing changes without complication. With that being said the wound seems to be mostly arrested at its current size but really is not making any progress except for when we prescribed the prednisone. He did show some signs of dropping as far as the overall size of the wound during that interval week. Nonetheless this is something he is not on long-term at this point and unfortunately I think he is getting need either this or else the Humira which his dermatologist has discussed try to get approval for. With that being said he will be seeing his dermatologist on the 11th of this month that is November. 04/19/2019 on evaluation today patient appears to be doing really about the same the wound is measuring slightly larger compared to last time I saw him. He has not been into the office since November 2 due to the fact that he unfortunately had Covid as that his entire family. He tells me that it was rough but they did pull-through and he seems to be doing much better. Fortunately there is no signs of active infection at this time. No fevers, chills, nausea, vomiting, or diarrhea. 05/10/2019 on evaluation today patient unfortunately appears to be doing significantly worse as compared to last time I saw him. He does tell me that he has had his first dose of Humira and actually is scheduled to get the next one in the upcoming week. With that being said he tells me also that in the past several days he has been having a lot of issues with green drainage she showed me a picture this is more blue-green in color. He is also been having issues with increased sloughy buildup and the wound does appear to be larger today. Obviously this is not the direction that we want everything to take based on the starting of his Humira.  Nonetheless I think this is definitely a result of likely infection and to be honest I think this is probably Pseudomonas causing the infection based on what I am seeing. 05/24/2019 on evaluation today patient unfortunately appears to be doing significantly worse compared to his prior evaluation with me 2 weeks ago. I did review his culture results which showed that he does have Staph aureus as well as Pseudomonas noted on the culture. Nonetheless the Levaquin that I prescribed for him does not appear to have been appropriate and in fact he tells me he is no longer experiencing the green drainage and discharge that he had at the last visit. Fortunately there is no signs of active infection  at this time which is good news although the wound has significantly worsened it in fact is much deeper than it was previous. We have been utilizing up to this point triamcinolone ointment as the prescription topical of choice but at this time I really feel like that the wound is getting need to be packed in order to appropriately manage this due to the deeper nature of the wound. Therefore something along the lines of an alginate dressing may be more appropriate. 05/31/2019 upon inspection today patient's wound actually showed signs of doing poorly at this point. Unfortunately he just does not seem to be making any good progress despite what we have tried. He actually did go ahead and pick up the Cipro and start taking that as he was noticing more green drainage he had previously completed the Levaquin that I prescribed for him as well. Nonetheless he missed his appointment for the seventh last week on Wednesday with the wound care center and [facility] where his dermatologist referred him. Obviously I do think a second opinion would be helpful at this point especially in light of the fact that the patient seems to be doing so poorly despite the fact that we have tried everything that I really know how at this  point. The only thing that ever seems to have helped him in the past is when he was on high doses of continual steroids that did seem to make a difference for him. 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 06/07/2019 upon evaluation today patient unfortunately appears to be doing worse yet again with regard to his wound. In fact I am starting to question whether or not he may have a fluid pocket in the muscle at this point based on the bulging and the soft appearance to the central portion of the muscle area. There is not anything draining from the muscle itself at this time which is good news but nonetheless the wound is expanding. I am not really seeing any results of the Humira as far as overall wound progression based on what I am seeing at this point. The patient has been referred for second opinion with regard to his wound to the [facility] wound care center by his dermatologist which I definitely am not in opposition to. Unfortunately we tried multiple dressings in the past including collagen, alginate, and at one point even Hydrofera Blue. With that being said he is never really used it for any significant amount of time due to the fact that he often complains of pain associated with these dressings and then will go back to either using the Santyl which she has done intermittently or more frequently the triamcinolone. He is also using his own compression stockings. We have wrapped him in the past but again that was something else that he really was not a big fan of. 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 He seemed to do much better when he  was on the steroids although honestly I understand that the steroids are not necessarily the best medication to be on long-term obviously 06/14/2019 on evaluation today patient appears to be doing actually a little bit better with regard to the overall appearance with his leg. Unfortunately he does continue to have issues with what appears to be some fluid underneath the muscle although he did see the wound specialty center at [facility] last week their main goals were to see about infusion therapy in place of the Humira as they feel like that is not quite strong enough. They also recommended that we continue with the treatment otherwise as we are they felt like that was appropriate and they are okay with him continuing to follow-up here with [place] in that regard. With that being said they are also sending him to the vein specialist there to see about vein stripping and if that would be of benefit for him. Subsequently they also did not really address whether or not an ultrasound of the muscle area to see if there is anything that needs to be addressed here would be appropriate or not. For that reason I discussed this with him last week I think we may proceed down that road at this point. 06/21/2019 upon evaluation today patient's wound actually appears to be doing slightly better compared to previous evaluations. I do believe that he has made a difference with regard to the progression here with the use of oral steroids. Again in the past has been the only thing that is really calm things down. He does tell me that from [facility] is gotten a good news from there that there are no further vein stripping that is necessary at this point. I do not have that available for review today although the patient did relay this to me. He also did obtain and have the ultrasound of the wound completed which I did  sign off on today. It does appear that there is no fluid collection under the muscle this is likely then just edematous tissue in general. That is also good news. Overall I still believe the inflammation is the main issue here. He did inquire about the possibility of a wound VAC again with the muscle protruding like it is I am not really sure whether the wound VAC is necessarily ideal or not. That is something we will have to consider although I do believe he may need compression wrapping to try to help with edema control which could potentially be of benefit. 06/28/2019 on evaluation today patient appears to be doing slightly better measurement wise although this is not terribly smaller he least seems to be trending towards that direction. With that being said he still seems to have purulent drainage noted in the wound bed at this time. He has been on Levaquin followed by Cipro over the past month. Unfortunately he still seems to have some issues with active infection at this time. I did perform a culture last week in order to evaluate and see if indeed there was still anything going on. Subsequently the culture did come back Dyal, Piers ([id number]) showing Pseudomonas which is consistent with the drainage has been having which is blue-green in color. He also has had an odor that again was somewhat consistent with Pseudomonas as well. Long story short it appears that the culture showed an intermediate finding with regard to how well the Cipro will work for the Pseudomonas infection. Subsequently being that he does not seem to be clearing up  and at best what we are doing is just keeping this at [facility] I think he may need to see infectious disease to discuss IV antibiotic options. 07/05/2019 upon evaluation today patient appears to be doing okay in regard to his leg ulcer. He has been tolerating the dressing changes at this point without complication. Fortunately there is no signs of active infection at  this time which is good news. No fevers, chills, nausea, vomiting, or diarrhea. With that being said he does have an appointment with infectious disease tomorrow and his primary care on Wednesday. Again the reason for the infectious disease referral was due to the fact that he did not seem to be fully resolving with the use of oral antibiotics and therefore we were thinking that IV antibiotic therapy may be necessary secondary to the fact that there was an intermediate finding for how effective the Cipro may be. Nonetheless again he has been having a lot of purulent and even green drainage. Fortunately right now that seems to have calmed down over the past week with the reinitiation of the oral antibiotic. Nonetheless we will see what Dr. Megan Salon has to say. 07/12/2019 upon evaluation today patient appears to be doing about the same at this point in regard to his left lower extremity ulcer. Fortunately there is no signs of active infection at this time which is good news I do believe the Levaquin has been beneficial I did review Dr. Hale Bogus note and to be honest I agree that the patient's leg does appear to be doing better currently. What we found in the past as he does not seem to really completely resolve he will stop the antibiotic and then subsequently things will revert back to having issues with blue-green drainage, increased pain, and overall worsening in general. Obviously that is the reason I sent him back to infectious disease. 07/19/2019 upon evaluation today patient appears to be doing roughly the same in size there is really no dramatic improvement. He has started back on the Levaquin at this point and though he seems to be doing okay he did still have a lot of blue/green drainage noted on evaluation today unfortunately. I think that this is still indicative more likely of a Pseudomonas infection as previously noted and again he does see Dr. Megan Salon in just a couple of days. I do not  know that were really able to effectively clear this with just oral antibiotics alone based on what I am seeing currently. Nonetheless we are still continue to try to manage as best we can with regard to the patient and his wound. I do think the wrap was helpful in decreasing the edema which is excellent news. No fevers, chills, nausea, vomiting, or diarrhea. 07/26/2019 upon evaluation today patient appears to be doing slightly better with regard to the overall appearance of the muscle there is no dark discoloration centrally. Fortunately there is no signs of active infection at this time. No fevers, chills, nausea, vomiting, or diarrhea. Patient's wound bed currently the patient did have an appointment with Dr. Megan Salon at infectious disease last week. With that being said Dr. Megan Salon the patient states was still somewhat hesitant about put him on any IV antibiotics he wanted [place] to repeat cultures today and then see where things go going forward. He does look like Dr. Megan Salon because of some improvement the patient did have with the Levaquin wanted [place] to see about repeating cultures. If it indeed grows the Pseudomonas again then he recommended a  possibility of considering a PICC line placement and IV antibiotic therapy. He plans to see the patient back in 1 to 2 weeks. 08/02/2019 upon evaluation today patient appears to be doing poorly with regard to his left lower extremity. We did get the results of his culture back it shows that he is still showing evidence of Pseudomonas which is consistent with the purulent/blue-green drainage that he has currently. Subsequently the culture also shows that he now is showing resistance to the oral fluoroquinolones which is unfortunate as that was really the only thing to treat the infection prior. I do believe that he is looking like this is going require IV antibiotic therapy to get this under control. Fortunately there is no signs of systemic infection at this  time which is good news. The patient does see Dr. Megan Salon tomorrow. 08/09/2019 upon evaluation today patient appears to be doing better with regard to his left lower extremity ulcer in regard to the overall appearance. He is currently on IV antibiotic therapy. As ordered by Dr. Megan Salon. Currently the patient is on ceftazidime which she is going to take for the next 2 weeks and then follow-up for 4 to 5-week appointment with Dr. Megan Salon. The patient started this this past Friday symptoms have not for a total of 3 days currently in full. 08/16/2019 upon evaluation today patient's wound actually does show muscle in the base of the wound but in general does appear to be much better as far as the overall evidence of infection is concerned. In fact I feel like this is for the most part cleared up he still on the IV antibiotics he has not completed the full course yet but I think he is doing much better which is excellent news. 08/23/2019 upon evaluation today patient appears to be doing about the same with regard to his wound at this point. He tells me that he still has pain unfortunately. Fortunately there is no evidence of systemic infection at this time which is great news. There is significant muscle protrusion. 09/13/19 upon evaluation today patient appears to be doing about the same in regard to his leg unfortunately. He still has a lot of drainage coming from the ulceration there is still muscle exposed. With that being said the patient's last wound culture still showed an intermediate finding with regard to the Pseudomonas he still having the bluish/green drainage as well. Overall I do not know that the wound has completely cleared of infection at this point. Fortunately there is no signs of active infection systemically at this point which is good news. 09/20/2019 upon evaluation today patient's wound actually appears to be doing about the same based on what I am seeing currently. I do not see any  signs of systemic infection he still does have evidence of some local infection and drainage. He did see Dr. Megan Salon last week and Dr. Megan Salon states that he probably does need a different IV antibiotic although he does not want to put him on this until the patient begins the Remicade infusion which is actually scheduled for about 10 days out from today on 13 May. Following that time Dr. Megan Salon is good to see him back and then will evaluate the feasibility of starting him on the IV antibiotic therapy once again at that point. I do not disagree with this plan I do believe as Dr. Megan Salon stated in his note that I reviewed today that the patient's issue is multifactorial with the pyoderma being 1 aspect of this  that were hoping the Remicade will be helpful for her. In the meantime I think the gentamicin is, helping to keep things under decent okay control in regard to the ulcer. 09/27/2019 upon evaluation today patient appears to be doing about the same with regard to his wound still there is a lot of muscle exposure though he does have some hyper granulation tissue noted around the edge and actually some granulation tissue starting to form over the muscle which is actually good news. Fortunately there is no evidence of active infection which is also good news. His pain is less at this point. 5/21; this is a patient I have not seen in a long time. 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 He has a large wound on the left lateral leg with protruding muscle. He comes in the clinic today showing the same area on his left medial ankle. He says there is been a spot there for some time although we have not previously defined this. Today he has a clearly defined area with slight amount of skin breakdown surrounded by raised areas with a purplish hue in color. This is not painful he says it is irritated. This looks distinctly like I might imagine pyoderma  starting 10/25/2019 upon evaluation today patient's wound actually appears to be making some progress. He still has muscle protruding from the lateral portion of his left leg but fortunately the new area that they were concerned about at his last visit does not appear to have opened at this point. He is currently on Remicade infusions and seems to be doing better in my opinion in fact the wound itself seems to be overall much better. The purplish discoloration that he did have seems to have resolved and I think that is a good sign that hopefully the Remicade is doing its job. He does Alligood, Filimon ([id number]) have some biofilm noted over the surface of the wound. 11/01/2019 on evaluation today patient's wound actually appears to be doing excellent at this time. Fortunately there is no evidence of active infection and overall I feel like he is making great progress. The Remicade seems to be due excellent job in my opinion. 11/08/19 evaluation today vision actually appears to be doing quite well with regard to his weight ulcer. He's been tolerating dressing changes without complication. Fortunately there is no evidence of infection. No fevers, chills, nausea, or vomiting noted at this time. Overall states that is having more itching than pain which is actually a good sign in my opinion. 12/13/2019 upon evaluation today patient appears to be doing well today with regard to his wound. He has been tolerating the dressing changes without complication. Fortunately there is no sign of active infection at this time. No fevers, chills, nausea, vomiting, or diarrhea. Overall I feel like the infusion therapy has been very beneficial for him. 01/06/2020 on evaluation today patient appears to be doing well with regard to his wound. This is measuring smaller and actually looks to be doing better. Fortunately there is no signs of active infection at this point. No fevers, chills, nausea, vomiting, or diarrhea. With that  being said he does still have the blue-green drainage but this does not seem to be causing any significant issues currently. He has been using the gentamicin that does seem to be keeping things under decent control at this point. He goes later this morning for his next infusion therapy for the pyoderma which seems to also be very beneficial. 02/07/2020  on evaluation today patient appears to be doing about the same in regard to his wounds currently. Fortunately there is no signs of active infection systemically he does still have evidence of local infection still using gentamicin. He also is showing some signs of improvement albeit slowly I do feel like we are making some progress here. 02/21/2020 upon evaluation today patient appears to be making some signs of improvement the wound is measuring a little bit smaller which is great news and overall I am very pleased with where he stands currently. He is going to be having infusion therapy treatment on the 15th of this month. Fortunately there is no signs of active infection at this time. 03/13/2020 I do believe patient's wound is actually showing some signs of improvement here which is great news. He has continue with the infusion therapy through rheumatology/dermatology at [facility]. That does seem to be beneficial. I still think he gets as much benefit from this as he did from the prednisone initially but nonetheless obviously this is less harsh on his body that the prednisone as far as they are concerned. 03/31/2020 on evaluation today patient's wound actually showing signs of some pretty good improvement in regard to the overall appearance of the wound bed. There is still muscle exposed though he does have some epithelial growth around the edges of the wound. Fortunately there is no signs of active infection at this time. No fevers, chills, nausea, vomiting, or diarrhea. 04/24/2020 upon evaluation today patient appears to be doing about the same in regard  to his leg ulcer. He has been tolerating the dressing changes without complication. Fortunately there is no signs of active infection at this time. No fevers, chills, nausea, vomiting, or diarrhea. With that being said he still has a lot of irritation from the bandaging around the edges of the wound. We did discuss today the possibility of a referral to plastic surgery. 05/22/2020 on evaluation today patient appears to be doing well with regard to his wounds all things considered. He has not been able to get the Chantix apparently there is a recall nurse that I was unaware of put out by [employer] involuntarily. Nonetheless for now I am and I have to do some research into what may be the best option for him to help with quitting in regard to smoking and we discussed that today. 06/26/2020 upon evaluation today patient appears to be doing well with regard to his wound from the standpoint of infection I do not see any signs of infection at this point. With that being said unfortunately he is still continuing to have issues with muscle exposure and again he is not having a whole lot of new skin growth unfortunately. There does not appear to be any signs of active infection at this time. No fevers, chills, nausea, vomiting, or diarrhea. 07/10/2020 upon evaluation today patient appears to be doing a little bit more poorly currently compared to where he was previous. I am concerned currently about an active infection that may be getting worse especially in light of the increased size and tenderness of the wound bed. No fevers, chills, nausea, vomiting, or diarrhea. 07/24/2020 upon evaluation today patient appears to be doing poorly in regard to his leg ulcer. He has been tolerating the dressing changes without complication but unfortunately is having a lot of discomfort. 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 Subsequently I think he is going  require possibly IV antibiotics to  get this under control. I am very concerned about the severity of his infection and the amount of discomfort he is having. 07/31/2020 upon evaluation today patient appears to be doing about the same in regard to his leg wound. He did see Dr. Megan Salon and Dr. Megan Salon is actually going to start him on IV antibiotics. He goes for the PICC line tomorrow. With that being said there do not have that run for 2 weeks and then see how things are doing and depending on how he is progressing they may extend that a little longer. Nonetheless I am glad this is getting ready to be in place and definitely feel it may help the patient. In the meantime is been using mainly triamcinolone to the wound bed has an anti-inflammatory. 08/07/2020 on evaluation today patient appears to be doing well with regard to his wound compared even last week. In the interim he has gotten the PICC line placed and overall this seems to be doing excellent. There does not appear to be any evidence of infection which is great news systemically although locally of course has had the infection this appears to be improving with the use of the antibiotics. 08/14/2020 upon evaluation today patient's wound actually showing signs of excellent improvement. Overall the irritation has significantly improved the drainage is back down to more of a normal level and his pain is really pretty much nonexistent compared to what it was. Obviously I think that this is significantly improved secondary to the IV antibiotic therapy which has made all the difference in the world. Again he had a resistant form of Pseudomonas for which oral antibiotics just was not cutting it. Nonetheless I do think that still we need to consider the possibility of a surgical closure for this wound is been open so long and to be honest with muscle exposed I think this can be very hard to get this to close outside of this although definitely were  still working to try to do what we can in that regard. 08/21/2020 upon evaluation today patient appears to be doing very well with regard to his wounds on the left lateral lower extremity/calf area. Fortunately there does not appear to be signs of active infection which is great news and overall very pleased with where things stand today. He is actually wrapping up his treatment with IV antibiotics tomorrow. After that we will see where things go from there. 08/28/2020 upon evaluation today patient appears to be doing decently well with regard to his leg ulcer. There does not appear to be any signs of Herberg, Moritz ([id number]) active infection which is great news and overall very pleased with where things stand today. No fevers, chills, nausea, vomiting, or diarrhea. Objective Constitutional Well-nourished and well-hydrated in no acute distress. Vitals Time Taken: 9:10 AM, Height: 71 in, Weight: 338 lbs, BMI: 47.1, Temperature: 98.3 F, Pulse: 83 bpm, Respiratory Rate: 16 breaths/min, Blood Pressure: 123/78 mmHg. Respiratory normal breathing without difficulty. Psychiatric this patient is able to make decisions and demonstrates good insight into disease process. Alert and Oriented x 3. pleasant and cooperative. General Notes: Upon inspection patient's wound bed showed signs of good granulation epithelization at this time. There does not appear to be any evidence of active infection at this time which is great news and overall I am extremely pleased with where things stand. No fevers, chills, nausea, vomiting, or diarrhea. Integumentary (Hair, Skin) Wound #1 status is Open. Original cause of wound was Gradually Appeared. The date acquired  was: 11/18/2015. The wound has been in treatment 194 weeks. The wound is located on the Left,Lateral Lower Leg. The wound measures 6cm length x 6cm width x 0.7cm depth; 28.274cm^2 area and 19.792cm^3 volume. There is muscle and Fat Layer (Subcutaneous Tissue)  exposed. There is no tunneling or undermining noted. There is a medium amount of serous drainage noted. The wound margin is epibole. There is medium (34-66%) red, pink granulation within the wound bed. There is a medium (34-66%) amount of necrotic tissue within the wound bed including Adherent Slough. 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 Edema Control - Lymphedema / Segmental Compressive Device / Other: Tubigrip double layer applied - F WOUND #1: - Lower Leg Wound Laterality: Left, Lateral Cleanser: Soap and Water 1 x Per Day/30 Days Discharge Instructions: Gently cleanse wound with antibacterial soap, rinse and pat dry prior to dressing wounds Topical: Triamcinolone Acetonide Cream, 0.1%, 15 (g) tube 1 x Per Day/30 Days Discharge Instructions: to wound bed , lightly Secondary Dressing: Mepilex Border Flex, 6x6 (in/in) 1 x Per Day/30 Days Discharge Instructions: Apply to wound as directed. Do not cut. Add-Ons: tubi grip F (Generic) 1 x Per Day/30 Days Discharge Instructions: apply to lower leg Reagle, Jarick ([id number]) 1. Would recommend that we continue with the wound care measures as before using a little bit of the triamcinolone cream and then subsequently he is applying the border foam dressing to cover. That seems to be doing a great job. 2. I am also can recommend that he continue to elevate his leg and use his compression sock on a regular basis I think that still of utmost importance. We will see patient back for reevaluation in 1 week here in the clinic. If anything worsens or changes patient will contact our office for  additional recommendations. Electronic Signature(s) Signed: 08/28/2020 9:48:10 AM By: Worthy Keeler PA-C Entered By: Worthy Keeler on 08/28/2020 09:48:10 Kazee, [name] ([id number]) -------------------------------------------------------------------------------- SuperBill Details Patient Name: [name] Date of Service: 08/28/2020 Medical Record Number: [id number] Patient Account Number: [id number] Date of Birth/Sex: 1981/03/13 (40 y.o. M) Treating RN: Carlene Coria Primary Care Provider: [name] Other Clinician: Jeanine Luz Referring Provider: [name] Treating Provider/Extender: [name] Cliche in Treatment: 194 Diagnosis Coding ICD-10 Codes Code Description [phone] Non-pressure chronic ulcer of left calf with fat layer exposed L88 Pyoderma gangrenosum L97.321 Non-pressure chronic ulcer of left ankle limited to breakdown of skin I87.2 Venous insufficiency (chronic) (peripheral) L03.116 Cellulitis of left lower limb F17.208 Nicotine dependence, unspecified, with other nicotine-induced disorders Facility Procedures CPT4 Code: [id number] Description: 99213 - WOUND CARE VISIT-LEV 3 EST PT Modifier: Quantity: 1 Physician Procedures CPT4 Code: 3383291 Description: 91660 - WC PHYS LEVEL 3 - EST PT Modifier: Quantity: 1 CPT4 Code: Description: ICD-10 Diagnosis Description L97.222 Non-pressure chronic ulcer of left calf with fat layer exposed L88 Pyoderma gangrenosum L97.321 Non-pressure chronic ulcer of left ankle limited to breakdown of skin I87.2 Venous insufficiency (chronic)  (peripheral) Modifier: Quantity: Electronic Signature(s) Signed: 08/28/2020 9:48:22 AM By: Worthy Keeler PA-C Entered By: Worthy Keeler on 08/28/2020 09:48:22

## 2020-08-28 NOTE — Progress Notes (Signed)
[name], [name] ([id number]) Visit Report for 08/28/2020 Arrival Information Details Patient Name: [name], [name] Date of Service: 08/28/2020 9:00 AM Medical Record Number: [id number] Patient Account Number: [id number] Date of Birth/Sex: 04-22-1981 (40 y.o. M) Treating RN: Donnamarie Poag Primary Care Wynnie Pacetti: [name] Other Clinician: Jeanine Luz Referring Gavyn Zoss: [name] Treating Aymar Whitfill/Extender: [name] Cliche in Treatment: 31 Visit Information History Since Last Visit Added or deleted any medications: No Patient Arrived: Ambulatory Had a fall or experienced change in No Arrival Time: 09:11 activities of daily living that may affect Accompanied By: self risk of falls: Transfer Assistance: None Hospitalized since last visit: No Patient Identification Verified: Yes Has Dressing in Place as Prescribed: Yes Secondary Verification Process Completed: Yes Pain Present Now: No Patient Requires Transmission-Based Precautions: No Patient Has Alerts: Yes Electronic Signature(s) Signed: 08/28/2020 4:36:12 PM By: Donnamarie Poag Entered By: Donnamarie Poag on 08/28/2020 [place] ([id number]) -------------------------------------------------------------------------------- Clinic Level of Care Assessment Details Patient Name: [name] Date of Service: 08/28/2020 9:00 AM Medical Record Number: [id number] Patient Account Number: [id number] Date of Birth/Sex: 1980/08/14 (40 y.o. M) Treating RN: Carlene Coria Primary Care Jihan Rudy: [name] Other Clinician: Jeanine Luz Referring Wali Reinheimer: [name] Treating Jrue Jarriel/Extender: [name] Cliche in Treatment: [facility] Level of Care Assessment Items TOOL 4 Quantity Score X - Use when only an EandM is performed on FOLLOW-UP visit 1 0 ASSESSMENTS - Nursing Assessment / Reassessment X - Reassessment of Co-morbidities (includes updates in patient status) 1 10 X- 1 5 Reassessment of Adherence to  Treatment Plan ASSESSMENTS - Wound and Skin Assessment / Reassessment X - Simple Wound Assessment / Reassessment - one wound 1 5 []  - 0 Complex Wound Assessment / Reassessment - multiple wounds []  - 0 Dermatologic / Skin Assessment (not related to wound area) ASSESSMENTS - Focused Assessment []  - Circumferential Edema Measurements - multi extremities 0 []  - 0 Nutritional Assessment / Counseling / Intervention []  - 0 Lower Extremity Assessment (monofilament, tuning fork, pulses) []  - 0 Peripheral Arterial Disease Assessment (using hand held doppler) ASSESSMENTS - Ostomy and/or Continence Assessment and Care []  - Incontinence Assessment and Management 0 []  - 0 Ostomy Care Assessment and Management (repouching, etc.) PROCESS - Coordination of Care X - Simple Patient / Family Education for ongoing care 1 15 []  - 0 Complex (extensive) Patient / Family Education for ongoing care X- 1 10 Staff obtains Consents, Records, Test Results / Process Orders []  - 0 Staff telephones HHA, Nursing Homes / Clarify orders / etc []  - 0 Routine Transfer to another Facility (non-emergent condition) []  - 0 Routine Hospital Admission (non-emergent condition) []  - 0 New Admissions / Biomedical engineer / Ordering NPWT, Apligraf, etc. []  - 0 Emergency Hospital Admission (emergent condition) X- 1 10 Simple Discharge Coordination []  - 0 Complex (extensive) Discharge Coordination PROCESS - Special Needs []  - Pediatric / Minor Patient Management 0 []  - 0 Isolation Patient Management []  - 0 Hearing / Language / Visual special needs []  - 0 Assessment of Community assistance (transportation, D/C planning, etc.) []  - 0 Additional assistance / Altered mentation []  - 0 Support Surface(s) Assessment (bed, cushion, seat, etc.) INTERVENTIONS - Wound Cleansing / Measurement [name], [name] ([id number]) X- 1 5 Simple Wound Cleansing - one wound []  - 0 Complex Wound Cleansing - multiple wounds X- 1  5 Wound Imaging (photographs - any number of wounds) []  - 0 Wound Tracing (instead of photographs) []  - 0 Simple Wound Measurement - one wound []  - 0 Complex Wound Measurement -  multiple wounds INTERVENTIONS - Wound Dressings X - Small Wound Dressing one or multiple wounds 1 10 []  - 0 Medium Wound Dressing one or multiple wounds []  - 0 Large Wound Dressing one or multiple wounds X- 1 5 Application of Medications - topical []  - 0 Application of Medications - injection INTERVENTIONS - Miscellaneous []  - External ear exam 0 []  - 0 Specimen Collection (cultures, biopsies, blood, body fluids, etc.) []  - 0 Specimen(s) / Culture(s) sent or taken to Lab for analysis []  - 0 Patient Transfer (multiple staff / Civil Service fast streamer / Similar devices) []  - 0 Simple Staple / Suture removal (25 or less) []  - 0 Complex Staple / Suture removal (26 or more) []  - 0 Hypo / Hyperglycemic Management (close monitor of Blood Glucose) []  - 0 Ankle / Brachial Index (ABI) - do not check if billed separately X- 1 5 Vital Signs Has the patient been seen at the hospital within the last three years: Yes Total Score: 85 Level Of Care: New/Established - Level 3 Electronic Signature(s) Signed: 08/28/2020 5:13:28 PM By: Carlene Coria RN Entered By: Carlene Coria on 08/28/2020 [name], [name] ([id number]) -------------------------------------------------------------------------------- Encounter Discharge Information Details Patient Name: [name] Date of Service: 08/28/2020 9:00 AM Medical Record Number: [id number] Patient Account Number: [id number] Date of Birth/Sex: 06-23-1980 (40 y.o. M) Treating RN: Dolan Amen Primary Care Kaiah Hosea: [name] Other Clinician: Jeanine Luz Referring Zenita Kister: [name] Treating Rachael Ferrie/Extender: [name] Cliche in Treatment: 194 Encounter Discharge Information Items Discharge Condition: Stable Ambulatory Status: Ambulatory Discharge  Destination: Home Transportation: Private Auto Accompanied By: self Schedule Follow-up Appointment: Yes Clinical Summary of Care: Electronic Signature(s) Signed: 08/28/2020 3:58:09 PM By: Georges Mouse, Minus Breeding RN Entered By: Georges Mouse, Minus Breeding on 08/28/2020 10:12:15 [name] ([id number]) -------------------------------------------------------------------------------- Lower Extremity Assessment Details Patient Name: [name] Date of Service: 08/28/2020 9:00 AM Medical Record Number: [id number] Patient Account Number: [id number] Date of Birth/Sex: 10/31/1980 (39 y.o. M) Treating RN: Donnamarie Poag Primary Care Jayshon Dommer: [name] Other Clinician: Jeanine Luz Referring Ayaansh Smail: [name] Treating Damara Klunder/Extender: [name] Cliche in Treatment: 194 Edema Assessment Assessed: [Left: Yes] [Right: No] Edema: [Left: Ye] [Right: s] Calf Left: Right: Point of Measurement: 33 cm From Medial Instep 48 cm Ankle Left: Right: Point of Measurement: 12 cm From Medial Instep 30.5 cm Vascular Assessment Pulses: Dorsalis Pedis Palpable: [Left:Yes] Notes states didn't use tubi grip for several days Electronic Signature(s) Signed: 08/28/2020 4:36:12 PM By: Donnamarie Poag Entered By: Donnamarie Poag on 08/28/2020 09:21:00 [name] ([id number]) -------------------------------------------------------------------------------- Multi Wound Chart Details Patient Name: [name] Date of Service: 08/28/2020 9:00 AM Medical Record Number: [id number] Patient Account Number: [id number] Date of Birth/Sex: 1980-12-03 (39 y.o. M) Treating RN: Carlene Coria Primary Care Khiyan Crace: [name] Other Clinician: Jeanine Luz Referring Dazhane Villagomez: [name] Treating Serah Nicoletti/Extender: [name] Cliche in Treatment: 194 Vital Signs Height(in): 71 Pulse(bpm): 34 Weight(lbs): 338 Blood Pressure(mmHg): 123/78 Body Mass Index(BMI): 47 Temperature(F): 98.3 Respiratory  Rate(breaths/min): 16 Photos: [N/A:N/A] Wound Location: Left, Lateral Lower Leg N/A N/A Wounding Event: Gradually Appeared N/A N/A Primary Etiology: Pyoderma N/A N/A Comorbid History: Sleep Apnea, Hypertension, Colitis N/A N/A Date Acquired: 11/18/2015 N/A N/A Weeks of Treatment: 194 N/A N/A Wound Status: Open N/A N/A Measurements L x W x D (cm) 6x6x0.7 N/A N/A Area (cm) : 28.274 N/A N/A Volume (cm) : 19.792 N/A N/A % Reduction in Area: -476.00% N/A N/A % Reduction in Volume: -404.00% N/A N/A Classification: Full Thickness With Exposed N/A N/A Support Structures Exudate Amount: Medium N/A  N/A Exudate Type: Serous N/A N/A Exudate Color: amber N/A N/A Wound Margin: Epibole N/A N/A Granulation Amount: Medium (34-66%) N/A N/A Granulation Quality: Red, Pink N/A N/A Necrotic Amount: Medium (34-66%) N/A N/A Exposed Structures: Fat Layer (Subcutaneous Tissue): N/A N/A Yes Muscle: Yes Fascia: No Tendon: No Joint: No Bone: No Epithelialization: None N/A N/A Treatment Notes Electronic Signature(s) Signed: 08/28/2020 5:13:28 PM By: Carlene Coria RN Entered By: Carlene Coria on 08/28/2020 09:43:50 [name] ([id number]) -------------------------------------------------------------------------------- Multi-Disciplinary Care Plan Details Patient Name: [name] Date of Service: 08/28/2020 9:00 AM Medical Record Number: [id number] Patient Account Number: [id number] Date of Birth/Sex: 1980/11/18 (40 y.o. M) Treating RN: Carlene Coria Primary Care Wednesday Ericsson: [name] Other Clinician: Jeanine Luz Referring Shikha Bibb: [name] Treating Akeia Perot/Extender: [name] Cliche in Treatment: [place] reviewed with physician Active Inactive Wound/Skin Impairment Nursing Diagnoses: Impaired tissue integrity Knowledge deficit related to ulceration/compromised skin integrity Goals: Patient/caregiver will verbalize understanding of skin care  regimen Date Initiated: 12/11/2016 Target Resolution Date: 09/23/2017 Goal Status: Active Ulcer/skin breakdown will have a volume reduction of 30% by week 4 Date Initiated: 12/11/2016 Date Inactivated: 06/26/2020 Target Resolution Date: 03/13/2017 Goal Status: Unmet Unmet Reason: comorbities Ulcer/skin breakdown will have a volume reduction of 50% by week 8 Date Initiated: 12/11/2016 Date Inactivated: 06/26/2020 Target Resolution Date: 03/13/2017 Goal Status: Unmet Unmet Reason: comorbities Ulcer/skin breakdown will have a volume reduction of 80% by week 12 Date Initiated: 12/11/2016 Date Inactivated: 06/26/2020 Target Resolution Date: 03/13/2017 Goal Status: Unmet Unmet Reason: comorbities Ulcer/skin breakdown will heal within 14 weeks Date Initiated: 12/11/2016 Date Inactivated: 06/26/2020 Target Resolution Date: 03/13/2017 Goal Status: Unmet Unmet Reason: comorbities Interventions: Assess patient/caregiver ability to obtain necessary supplies Assess patient/caregiver ability to perform ulcer/skin care regimen upon admission and as needed Assess ulceration(s) every visit Provide education on ulcer and skin care Treatment Activities: Skin care regimen initiated : 12/11/2016 Topical wound management initiated : 12/11/2016 Notes: Electronic Signature(s) Signed: 08/28/2020 5:13:28 PM By: Carlene Coria RN Entered By: Carlene Coria on 08/28/2020 09:43:40 [name] ([id number]) -------------------------------------------------------------------------------- Pain Assessment Details Patient Name: [name] Date of Service: 08/28/2020 9:00 AM Medical Record Number: [id number] Patient Account Number: [id number] Date of Birth/Sex: 1980-06-26 (40 y.o. M) Treating RN: Donnamarie Poag Primary Care Ambriel Gorelick: [name] Other Clinician: Jeanine Luz Referring Carlean Crowl: [name] Treating Cordarius Benning/Extender: [name] Cliche in Treatment: 194 Active Problems Location of Pain Severity  and Description of Pain Patient Has Paino No Site Locations Rate the pain. Current Pain Level: 0 Pain Management and Medication Current Pain Management: Notes none at this time, says itchy wound Electronic Signature(s) Signed: 08/28/2020 4:36:12 PM By: Donnamarie Poag Entered By: Donnamarie Poag on 08/28/2020 09:14:08 [name] ([id number]) -------------------------------------------------------------------------------- Patient/Caregiver Education Details Patient Name: [name] Date of Service: 08/28/2020 9:00 AM Medical Record Number: [id number] Patient Account Number: [id number] Date of Birth/Gender: August 22, 1980 (39 y.o. M) Treating RN: Carlene Coria Primary Care Physician: [name] Other Clinician: Jeanine Luz Referring Physician: [name] Treating Physician/Extender: [name] Cliche in Treatment: 36 Education Assessment Education Provided To: Patient Education Topics Provided Wound/Skin Impairment: Methods: Explain/Verbal Responses: State content correctly Electronic Signature(s) Signed: 08/28/2020 5:13:28 PM By: Carlene Coria RN Entered By: Carlene Coria on 08/28/2020 09:46:35 [name] ([id number]) -------------------------------------------------------------------------------- Wound Assessment Details Patient Name: [name] Date of Service: 08/28/2020 9:00 AM Medical Record Number: [id number] Patient Account Number: [id number] Date of Birth/Sex: July 15, 1980 (39 y.o. M) Treating RN: Donnamarie Poag Primary Care Hervey Wedig: [name] Other Clinician: Jeanine Luz Referring Odelia Graciano: [name]  Treating Eduin Friedel/Extender: Jeri Cos Weeks in Treatment: 194 Wound Status Wound Number: 1 Primary Etiology: Pyoderma Wound Location: Left, Lateral Lower Leg Wound Status: Open Wounding Event: Gradually Appeared Comorbid History: Sleep Apnea, Hypertension, Colitis Date Acquired: 11/18/2015 Weeks Of Treatment: 194 Clustered Wound:  No Photos Wound Measurements Length: (cm) 6 Width: (cm) 6 Depth: (cm) 0.7 Area: (cm) 28.274 Volume: (cm) 19.792 % Reduction in Area: -476% % Reduction in Volume: -404% Epithelialization: None Tunneling: No Undermining: No Wound Description Classification: Full Thickness With Exposed Support Structures Wound Margin: Epibole Exudate Amount: Medium Exudate Type: Serous Exudate Color: amber Foul Odor After Cleansing: No Slough/Fibrino Yes Wound Bed Granulation Amount: Medium (34-66%) Exposed Structure Granulation Quality: Red, Pink Fascia Exposed: No Necrotic Amount: Medium (34-66%) Fat Layer (Subcutaneous Tissue) Exposed: Yes Necrotic Quality: Adherent Slough Tendon Exposed: No Muscle Exposed: Yes Necrosis of Muscle: No Joint Exposed: No Bone Exposed: No Treatment Notes Wound #1 (Lower Leg) Wound Laterality: Left, Lateral Cleanser Soap and Water Discharge Instruction: Gently cleanse wound with antibacterial soap, rinse and pat dry prior to dressing wounds [name], [name] ([id number]) Peri-Wound Care Topical Triamcinolone Acetonide Cream, 0.1%, 15 (g) tube Discharge Instruction: to wound bed , lightly Primary Dressing Secondary Dressing Mepilex Border Flex, 6x6 (in/in) Discharge Instruction: Apply to wound as directed. Do not cut. Secured With Compression Wrap Compression Stockings Add-Ons tubi grip F Discharge Instruction: apply double layer to lower leg Electronic Signature(s) Signed: 08/28/2020 4:36:12 PM By: Donnamarie Poag Entered By: Donnamarie Poag on 08/28/2020 09:19:03 [name] ([id number]) -------------------------------------------------------------------------------- [place] Details Patient Name: [name] Date of Service: 08/28/2020 9:00 AM Medical Record Number: [id number] Patient Account Number: [id number] Date of Birth/Sex: [birth year] (40 y.o. M) Treating RN: Donnamarie Poag Primary Care Errin Chewning: [name] Other Clinician: Jeanine Luz Referring Zahmir Lalla: [name] Treating Kevyn Boquet/Extender: [name] Cliche in Treatment: 194 Vital Signs Time Taken: 09:10 Temperature (F): 98.3 Height (in): 71 Pulse (bpm): 83 Weight (lbs): 338 Respiratory Rate (breaths/min): 16 Body Mass Index (BMI): 47.1 Blood Pressure (mmHg): 123/78 Reference Range: 80 - 120 mg / dl Electronic Signature(s) Signed: 08/28/2020 4:36:12 PM By: Donnamarie Poag Entered ByDonnamarie Poag on 08/28/2020 09:13:49

## 2020-08-31 ENCOUNTER — Ambulatory Visit: Payer: 59 | Admitting: Primary Care

## 2020-09-07 ENCOUNTER — Encounter: Payer: Self-pay | Admitting: Primary Care

## 2020-09-07 ENCOUNTER — Other Ambulatory Visit: Payer: Self-pay

## 2020-09-07 ENCOUNTER — Ambulatory Visit (INDEPENDENT_AMBULATORY_CARE_PROVIDER_SITE_OTHER): Payer: 59 | Admitting: Primary Care

## 2020-09-07 VITALS — BP 132/82 | HR 73 | Temp 98.0°F | Ht 71.0 in | Wt 353.5 lb

## 2020-09-07 DIAGNOSIS — E785 Hyperlipidemia, unspecified: Secondary | ICD-10-CM | POA: Diagnosis not present

## 2020-09-07 DIAGNOSIS — Z720 Tobacco use: Secondary | ICD-10-CM

## 2020-09-07 DIAGNOSIS — Z1159 Encounter for screening for other viral diseases: Secondary | ICD-10-CM | POA: Diagnosis not present

## 2020-09-07 DIAGNOSIS — E119 Type 2 diabetes mellitus without complications: Secondary | ICD-10-CM | POA: Diagnosis not present

## 2020-09-07 DIAGNOSIS — Z114 Encounter for screening for human immunodeficiency virus [HIV]: Secondary | ICD-10-CM

## 2020-09-07 DIAGNOSIS — I10 Essential (primary) hypertension: Secondary | ICD-10-CM

## 2020-09-07 MED ORDER — FREESTYLE LIBRE 14 DAY READER DEVI: 0 refills | Status: DC

## 2020-09-07 MED ORDER — FREESTYLE LIBRE 14 DAY SENSOR MISC
1 refills | Status: DC
Start: 2020-09-07 — End: 2020-11-28

## 2020-09-07 MED ORDER — NICOTINE 21 MG/24HR TD PT24: 21.0000 mg | MEDICATED_PATCH | Freq: Every day | TRANSDERMAL | 0 refills | Status: DC

## 2020-09-07 NOTE — Assessment & Plan Note (Signed)
Chantix not covered by [employer]. Rx for nicotine patches sent to pharmacy.

## 2020-09-07 NOTE — Patient Instructions (Signed)
Pick up the Freestyle Libre sensor and device at the pharmacy for blood sugar checks.  Start checking your blood sugar levels.  Appropriate times to check your blood sugar levels are:  -Before any meal (breakfast, lunch, dinner) -Two hours after any meal (breakfast, lunch, dinner) -Bedtime  Start the nicotine patches as discussed.  Schedule a lab appointment for 1 month.   It was a pleasure to see you today!

## 2020-09-07 NOTE — Assessment & Plan Note (Signed)
Improved on exam today, continue to monitor.  Continue without medication treatment for now.

## 2020-09-07 NOTE — Assessment & Plan Note (Signed)
Non compliant with glucose checks. Compliant to Lantus 30 units and Metformin 1000 mg BID, continue same.  Will find out what happened with Trulicity, would like for him to start if possible.   Rx for [employer] sensor and device send to pharmacy. Stressed the importance of checking glucose readings.   Repeat A1C in 1 month. Follow up TBD.

## 2020-09-07 NOTE — Progress Notes (Signed)
Subjective:    Patient ID: [name], male    DOB: [birth year], 40 y.o.   MRN: [id number]  HPI  [name] is a very pleasant 40 y.o. male with a history of type 2 diabetes, hypertension, pyoderma gangrenosum who presents today for follow up.  1) Essential Hypertension: Not currently on treatment. Last visit BP was above goal, he declined treatment, wanted to work on lifestyle changes.   He has his BP checked once weekly at the wound care center which ranges 130's-140's/70's.   BP Readings from Last 3 Encounters:  09/07/20 132/82  08/15/20 (!) 156/85  08/08/20 137/83   2) Type 2 Diabetes:  Current medications include:  Lantus 30 units, Trulicity 2.25 weekly, Metformin 1000 mg BID. Trulicity was never picked up by patient as PA was required.   He is checking his blood glucose infrequently. His last glucose check was three weeks ago which in the mid 200's. He often doesn't have time to check his glucose levels.   Last A1C: 10.3 in February 2022 Last Eye Exam: Due Last Foot Exam: UTD Pneumonia Vaccination: ACE/ARB: urine micro UTD Statin: Lipitor   3) Tobacco Abuse: Chronic and intermittent smoking history for 17 years. During last visit he endorsed readiness to quit. Chantix Rx sent to pharmacy last visit.   Since his last visit he hasn't started Chantix as his insurance company would not approve. He is still smoking 1 PPD. He has yet to try the nicotine patches. Was once managed on Wellbutrin years ago.    Review of Systems  Eyes: Negative for visual disturbance.  Respiratory: Negative for shortness of breath.   Cardiovascular: Negative for chest pain.  Neurological: Negative for dizziness.         Past Medical History:  Diagnosis Date  . Blood in stool   . Depression   . Elevated blood pressure   . Hyperlipidemia   . OSA (obstructive sleep apnea) 2014  . Seasonal allergies   . Ulcerative colitis ([place]) 03/05/2018    Social History   Socioeconomic History   . Marital status: Married    Spouse name: Not on file  . Number of children: Not on file  . Years of education: Not on file  . Highest education level: Not on file  Occupational History  . Not on file  Tobacco Use  . Smoking status: Current Every Day Smoker    Packs/day: 1.00    Years: 14.00    Pack years: 14.00    Types: Cigarettes    Last attempt to quit: 11/25/2018    Years since quitting: 1.7  . Smokeless tobacco: Former Systems developer    Quit date: 03/17/2015  Vaping Use  . Vaping Use: Never used  Substance and Sexual Activity  . Alcohol use: Not Currently    Alcohol/week: 0.0 standard drinks    Comment: soical  . Drug use: Not on file  . Sexual activity: Not on file  Other Topics Concern  . Not on file  Social History Narrative   Married.   7 kids.   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   Enjoys target shooting.    Social Determinants of Health   Financial Resource Strain: Not on file  Food Insecurity: Not on file  Transportation Needs: Not on file  Physical Activity: Not on file  Stress: Not on file  Social Connections: Not on file  Intimate Partner Violence: Not on file    Past Surgical History:  Procedure Laterality Date  .  SEPTOPLASTY  2007  . WRIST SURGERY      Family History  Problem Relation Age of Onset  . Alcohol abuse Paternal Aunt   . Alcohol abuse Paternal Uncle   . Stroke Paternal Uncle   . Hyperlipidemia Maternal Grandfather   . Hypertension Maternal Grandfather   . Diabetes Maternal Grandfather   . Alcohol abuse Maternal Grandfather   . Multiple sclerosis Mother   . Dementia Mother     Allergies  Allergen Reactions  . Biaxin [Clarithromycin] Other (See Comments)    Causes colitis flares  . Milk-Related Compounds Hives    Current Outpatient Medications on File Prior to Visit  Medication Sig Dispense Refill  . acetaminophen (TYLENOL) 500 MG tablet Take 1,000 mg by mouth every 6 (six) hours as needed for headache (pain).    Marland Kitchen  atorvastatin (LIPITOR) 40 MG tablet Take 1 tablet (40 mg total) by mouth daily. For cholesterol. 90 tablet 3  . blood glucose meter kit and supplies KIT Dispense based on patient and insurance preference. Use up to four times daily as directed. (FOR ICD-9 250.00, 250.01). 1 each 0  . clobetasol ointment (TEMOVATE) 7.01 % Apply 1 application topically 2 (two) times daily.    . fluticasone (FLONASE) 50 MCG/ACT nasal spray Place 1 spray into both nostrils 2 (two) times daily. 16 g 6  . ibuprofen (ADVIL,MOTRIN) 200 MG tablet Take 400-600 mg by mouth every 6 (six) hours as needed for headache (pain).    . inFLIXimab (REMICADE) 100 MG injection Inject into the muscle once a week.    . insulin glargine (LANTUS SOLOSTAR) 100 UNIT/ML Solostar Pen Inject 30 Units into the skin at bedtime. 15 mL 2  . metFORMIN (GLUCOPHAGE) 1000 MG tablet TAKE 1 TABLET BY MOUTH TWICE DAILY WITH MEALS FOR  DIABETES 180 tablet 0  . mupirocin ointment (BACTROBAN) 2 % Apply topically two (2) 60g times a day. Mix with clobetasol and apply twice daily    . RELION PEN NEEDLES 31G X 6 MM MISC USE NIGHTLY WITH INSULIN AS DIRECTED 100 each 0  . SANTYL ointment Apply topically.    . triamcinolone ointment (KENALOG) 0.1 % Apply topically.    . venlafaxine XR (EFFEXOR-XR) 150 MG 24 hr capsule Take 1 capsule (150 mg total) by mouth daily with breakfast. For depression. 90 capsule 3  . Dulaglutide (TRULICITY) 7.79 TJ/0.3ES SOPN Inject 0.75 mg into the skin once a week. For diabetes. (Patient not taking: No sig reported) 2 mL 3  . varenicline (CHANTIX) 1 MG tablet Take 1/2 tablet daily for three days, then 1/2 tablet twice daily for five days, then 1 tablet twice daily thereafter for smoking cessation. (Patient not taking: No sig reported) 60 tablet 0   No current facility-administered medications on file prior to visit.    BP 132/82   Pulse 73   Temp 98 F (36.7 C) (Temporal)   Ht 5' 11"  (1.803 m)   Wt (!) 353 lb 8 oz (160.3 kg)    SpO2 97%   BMI 49.30 kg/m  Objective:   Physical Exam Cardiovascular:     Rate and Rhythm: Normal rate and regular rhythm.  Pulmonary:     Effort: Pulmonary effort is normal.     Breath sounds: Normal breath sounds. No wheezing or rales.  Musculoskeletal:     Cervical back: Neck supple.  Skin:    General: Skin is warm and dry.  Neurological:     Mental Status: He is alert and oriented  to person, place, and time.           Assessment & Plan:      This visit occurred during the SARS-CoV-2 public health emergency.  Safety protocols were in place, including screening questions prior to the visit, additional usage of staff PPE, and extensive cleaning of exam room while observing appropriate contact time as indicated for disinfecting solutions.

## 2020-09-12 ENCOUNTER — Ambulatory Visit: Payer: 59 | Admitting: Internal Medicine

## 2020-09-14 ENCOUNTER — Ambulatory Visit: Payer: 59 | Admitting: Internal Medicine

## 2020-09-18 ENCOUNTER — Encounter: Payer: 59 | Attending: Physician Assistant | Admitting: Physician Assistant

## 2020-09-18 ENCOUNTER — Other Ambulatory Visit: Payer: Self-pay

## 2020-09-18 DIAGNOSIS — F17200 Nicotine dependence, unspecified, uncomplicated: Secondary | ICD-10-CM | POA: Diagnosis not present

## 2020-09-18 DIAGNOSIS — I10 Essential (primary) hypertension: Secondary | ICD-10-CM | POA: Diagnosis not present

## 2020-09-18 DIAGNOSIS — K5190 Ulcerative colitis, unspecified, without complications: Secondary | ICD-10-CM | POA: Insufficient documentation

## 2020-09-18 DIAGNOSIS — L97321 Non-pressure chronic ulcer of left ankle limited to breakdown of skin: Secondary | ICD-10-CM | POA: Diagnosis present

## 2020-09-18 DIAGNOSIS — L97222 Non-pressure chronic ulcer of left calf with fat layer exposed: Secondary | ICD-10-CM | POA: Diagnosis not present

## 2020-09-18 NOTE — Progress Notes (Addendum)
HOGAN, HOOBLER ([id number]) Visit Report for 09/18/2020 Arrival Information Details Patient Name: [name] Date of Service: 09/18/2020 8:15 AM Medical Record Number: [id number] Patient Account Number: [id number] Date of Birth/Sex: 08/15/1980 (40 y.o. M) Treating RN: Carlene Coria Primary Care Sayan Aldava: [name] Other Clinician: Referring Shaniquia Brafford: [name] Treating Janece Laidlaw/Extender: [name] Cliche in Treatment: 43 Visit Information History Since Last Visit All ordered tests and consults were completed: No Patient Arrived: Ambulatory Added or deleted any medications: No Arrival Time: 08:33 Any new allergies or adverse reactions: No Accompanied By: self Had a fall or experienced change in No Transfer Assistance: None activities of daily living that may affect Patient Identification Verified: Yes risk of falls: Secondary Verification Process Completed: Yes Signs or symptoms of abuse/neglect since last visito No Patient Requires Transmission-Based Precautions: No Hospitalized since last visit: No Patient Has Alerts: Yes Implantable device outside of the clinic excluding No cellular tissue based products placed in the center since last visit: Has Dressing in Place as Prescribed: Yes Pain Present Now: Yes Electronic Signature(s) Signed: 09/18/2020 5:22:07 PM By: Carlene Coria RN Entered By: Carlene Coria on 09/18/2020 08:34:08 [name] ([id number]) -------------------------------------------------------------------------------- Clinic Level of Care Assessment Details Patient Name: [name] Date of Service: 09/18/2020 8:15 AM Medical Record Number: [id number] Patient Account Number: [id number] Date of Birth/Sex: 1980-09-22 (40 y.o. M) Treating RN: Donnamarie Poag Primary Care Ann Bohne: [name] Other Clinician: Referring Yolinda Duerr: [name] Treating Ever Halberg/Extender: [name] Cliche in Treatment: [facility] Level of Care Assessment Items TOOL 4  Quantity Score []  - Use when only an EandM is performed on FOLLOW-UP visit 0 ASSESSMENTS - Nursing Assessment / Reassessment []  - Reassessment of Co-morbidities (includes updates in patient status) 0 []  - 0 Reassessment of Adherence to Treatment Plan ASSESSMENTS - Wound and Skin Assessment / Reassessment X - Simple Wound Assessment / Reassessment - one wound 1 5 []  - 0 Complex Wound Assessment / Reassessment - multiple wounds []  - 0 Dermatologic / Skin Assessment (not related to wound area) ASSESSMENTS - Focused Assessment []  - Circumferential Edema Measurements - multi extremities 0 []  - 0 Nutritional Assessment / Counseling / Intervention []  - 0 Lower Extremity Assessment (monofilament, tuning fork, pulses) []  - 0 Peripheral Arterial Disease Assessment (using hand held doppler) ASSESSMENTS - Ostomy and/or Continence Assessment and Care []  - Incontinence Assessment and Management 0 []  - 0 Ostomy Care Assessment and Management (repouching, etc.) PROCESS - Coordination of Care X - Simple Patient / Family Education for ongoing care 1 15 []  - 0 Complex (extensive) Patient / Family Education for ongoing care []  - 0 Staff obtains Programmer, systems, Records, Test Results / Process Orders []  - 0 Staff telephones HHA, Nursing Homes / Clarify orders / etc []  - 0 Routine Transfer to another Facility (non-emergent condition) []  - 0 Routine Hospital Admission (non-emergent condition) []  - 0 New Admissions / Biomedical engineer / Ordering NPWT, Apligraf, etc. []  - 0 Emergency Hospital Admission (emergent condition) X- 1 10 Simple Discharge Coordination []  - 0 Complex (extensive) Discharge Coordination PROCESS - Special Needs []  - Pediatric / Minor Patient Management 0 []  - 0 Isolation Patient Management []  - 0 Hearing / Language / Visual special needs []  - 0 Assessment of Community assistance (transportation, D/C planning, etc.) []  - 0 Additional assistance / Altered mentation []   - 0 Support Surface(s) Assessment (bed, cushion, seat, etc.) INTERVENTIONS - Wound Cleansing / Measurement [name], [name] ([id number]) X- 1 5 Simple Wound Cleansing - one wound []  - 0 Complex Wound  Cleansing - multiple wounds X- 1 5 Wound Imaging (photographs - any number of wounds) []  - 0 Wound Tracing (instead of photographs) X- 1 5 Simple Wound Measurement - one wound []  - 0 Complex Wound Measurement - multiple wounds INTERVENTIONS - Wound Dressings []  - Small Wound Dressing one or multiple wounds 0 X- 1 15 Medium Wound Dressing one or multiple wounds []  - 0 Large Wound Dressing one or multiple wounds []  - 0 Application of Medications - topical []  - 0 Application of Medications - injection INTERVENTIONS - Miscellaneous []  - External ear exam 0 []  - 0 Specimen Collection (cultures, biopsies, blood, body fluids, etc.) []  - 0 Specimen(s) / Culture(s) sent or taken to Lab for analysis []  - 0 Patient Transfer (multiple staff / Civil Service fast streamer / Similar devices) []  - 0 Simple Staple / Suture removal (25 or less) []  - 0 Complex Staple / Suture removal (26 or more) []  - 0 Hypo / Hyperglycemic Management (close monitor of Blood Glucose) []  - 0 Ankle / Brachial Index (ABI) - do not check if billed separately X- 1 5 Vital Signs Has the patient been seen at the hospital within the last three years: Yes Total Score: 65 Level Of Care: New/Established - Level 2 Electronic Signature(s) Signed: 09/18/2020 1:00:54 PM By: Donnamarie Poag Entered By: Donnamarie Poag on 09/18/2020 09:00:27 [name] ([id number]) -------------------------------------------------------------------------------- Encounter Discharge Information Details Patient Name: [name] Date of Service: 09/18/2020 8:15 AM Medical Record Number: [id number] Patient Account Number: [id number] Date of Birth/Sex: [birth year] (39 y.o. M) Treating RN: Carlene Coria Primary Care Demontrae Gilbert: [name] Other Clinician: Referring  Takiyah Bohnsack: [name] Treating Ladrea Holladay/Extender: [name] Cliche in Treatment: 197 Encounter Discharge Information Items Discharge Condition: Stable Ambulatory Status: Ambulatory Discharge Destination: Home Transportation: Private Auto Accompanied By: self Schedule Follow-up Appointment: Yes Clinical Summary of Care: Electronic Signature(s) Signed: 09/18/2020 5:22:07 PM By: Carlene Coria RN Entered By: Carlene Coria on 09/18/2020 09:22:57 [name] ([id number]) -------------------------------------------------------------------------------- Lower Extremity Assessment Details Patient Name: [name] Date of Service: 09/18/2020 8:15 AM Medical Record Number: [id number] Patient Account Number: [id number] Date of Birth/Sex: 1981/02/11 (39 y.o. M) Treating RN: Carlene Coria Primary Care Jeston Junkins: [name] Other Clinician: Referring Sharmin Foulk: [name] Treating Liann Spaeth/Extender: [name] Cliche in Treatment: 197 Edema Assessment Assessed: [Left: No] [Right: Yes] Edema: [Left: Ye] [Right: s] Calf Left: Right: Point of Measurement: 33 cm From Medial Instep 50 cm Ankle Left: Right: Point of Measurement: 12 cm From Medial Instep 32 cm Vascular Assessment Pulses: Dorsalis Pedis Palpable: [Left:Yes] Electronic Signature(s) Signed: 09/18/2020 5:22:07 PM By: Carlene Coria RN Entered By: Carlene Coria on 09/18/2020 08:44:19 [name], [name] ([id number]) -------------------------------------------------------------------------------- Multi Wound Chart Details Patient Name: [name] Date of Service: 09/18/2020 8:15 AM Medical Record Number: [id number] Patient Account Number: [id number] Date of Birth/Sex: 03-Feb-1981 (39 y.o. M) Treating RN: Donnamarie Poag Primary Care Abdur Hoglund: [name] Other Clinician: Referring Jaxsen Bernhart: [name] Treating Caitlynne Harbeck/Extender: [name] Cliche in Treatment: 197 Vital Signs Height(in): 71 Pulse(bpm): 83 Weight(lbs):  338 Blood Pressure(mmHg): 152/79 Body Mass Index(BMI): 47 Temperature(F): 98.3 Respiratory Rate(breaths/min): 18 Photos: [N/A:N/A] Wound Location: Left, Lateral Lower Leg N/A N/A Wounding Event: Gradually Appeared N/A N/A Primary Etiology: Pyoderma N/A N/A Comorbid History: Sleep Apnea, Hypertension, Colitis N/A N/A Date Acquired: 11/18/2015 N/A N/A Weeks of Treatment: 197 N/A N/A Wound Status: Open N/A N/A Measurements L x W x D (cm) 6.5x7.5x1 N/A N/A Area (cm) : 38.288 N/A N/A Volume (cm) : 38.288 N/A N/A % Reduction in Area: -680.00% N/A  N/A % Reduction in Volume: -875.00% N/A N/A Classification: Full Thickness With Exposed N/A N/A Support Structures Exudate Amount: Medium N/A N/A Exudate Type: Serosanguineous N/A N/A Exudate Color: red, brown N/A N/A Wound Margin: Epibole N/A N/A Granulation Amount: Medium (34-66%) N/A N/A Granulation Quality: Red, Pink N/A N/A Necrotic Amount: Medium (34-66%) N/A N/A Exposed Structures: Fat Layer (Subcutaneous Tissue): N/A N/A Yes Muscle: Yes Fascia: No Tendon: No Joint: No Bone: No Epithelialization: None N/A N/A Treatment Notes Electronic Signature(s) Signed: 09/18/2020 1:00:54 PM By: Donnamarie Poag Entered By: Donnamarie Poag on 09/18/2020 08:54:09 [name] ([id number]) -------------------------------------------------------------------------------- Multi-Disciplinary Care Plan Details Patient Name: [name] Date of Service: 09/18/2020 8:15 AM Medical Record Number: [id number] Patient Account Number: [id number] Date of Birth/Sex: [birth year] (40 y.o. M) Treating RN: Donnamarie Poag Primary Care Henok Heacock: [name] Other Clinician: Referring Zuleica Seith: [name] Treating Patrecia Veiga/Extender: [name] Cliche in Treatment: [place] reviewed with physician Active Inactive Electronic Signature(s) Signed: 09/19/2020 4:34:00 PM By: Donnamarie Poag Previous Signature: 09/18/2020 1:00:54 PM Version By:  Donnamarie Poag Entered By: Donnamarie Poag on 09/19/2020 16:33:59 [name], [name] ([id number]) -------------------------------------------------------------------------------- Pain Assessment Details Patient Name: [name] Date of Service: 09/18/2020 8:15 AM Medical Record Number: [id number] Patient Account Number: [id number] Date of Birth/Sex: 1980/05/30 (39 y.o. M) Treating RN: Carlene Coria Primary Care Larita Deremer: [name] Other Clinician: Referring Alira Fretwell: [name] Treating Dshaun Reppucci/Extender: [name] Cliche in Treatment: 197 Active Problems Location of Pain Severity and Description of Pain Patient Has Paino Yes Site Locations With Dressing Change: Yes Duration of the Pain. Constant / Intermittento Intermittent How Long Does it Lasto Hours: Minutes: 15 Rate the pain. Current Pain Level: 4 Worst Pain Level: 4 Least Pain Level: 0 Tolerable Pain Level: 5 Character of Pain Describe the Pain: Aching, Other: sting Pain Management and Medication Current Pain Management: Medication: No Cold Application: No Rest: Yes Massage: No Activity: No T.E.N.S.: No Heat Application: No Leg drop or elevation: No Is the Current Pain Management Adequate: Inadequate How does your wound impact your activities of daily livingo Sleep: Yes Bathing: No Appetite: No Relationship With Others: No Bladder Continence: No Emotions: No Bowel Continence: No Work: No Toileting: No Drive: No Dressing: No Hobbies: No Electronic Signature(s) Signed: 09/18/2020 5:22:07 PM By: Carlene Coria RN Entered By: Carlene Coria on 09/18/2020 08:35:40 [name] ([id number]) -------------------------------------------------------------------------------- Patient/Caregiver Education Details Patient Name: [name] Date of Service: 09/18/2020 8:15 AM Medical Record Number: [id number] Patient Account Number: [id number] Date of Birth/Gender: 1980-12-05 (39 y.o. M) Treating RN: Donnamarie Poag Primary  Care Physician: [name] Other Clinician: Referring Physician: [name] Treating Physician/Extender: [name] Cliche in Treatment: 197 Education Assessment Education Provided To: Patient Education Topics Provided Wound/Skin Impairment: Methods: Explain/Verbal Responses: State content correctly Electronic Signature(s) Signed: 09/18/2020 1:00:54 PM By: Donnamarie Poag Entered By: Donnamarie Poag on 09/18/2020 09:01:20 [name] ([id number]) -------------------------------------------------------------------------------- Wound Assessment Details Patient Name: [name] Date of Service: 09/18/2020 8:15 AM Medical Record Number: [id number] Patient Account Number: [id number] Date of Birth/Sex: 1981-05-03 (39 y.o. M) Treating RN: Carlene Coria Primary Care Blondina Coderre: [name] Other Clinician: Referring Makenna Macaluso: [name] Treating Rexanna Louthan/Extender: [name] Cliche in Treatment: 197 Wound Status Wound Number: 1 Primary Etiology: Pyoderma Wound Location: Left, Lateral Lower Leg Wound Status: Open Wounding Event: Gradually Appeared Comorbid History: Sleep Apnea, Hypertension, Colitis Date Acquired: 11/18/2015 Weeks Of Treatment: 197 Clustered Wound: No Photos Wound Measurements Length: (cm) 6.5 Width: (cm) 7.5 Depth: (cm) 1 Area: (cm) 38.288 Volume: (cm) 38.288 % Reduction in Area: -  680% % Reduction in Volume: -875% Epithelialization: None Tunneling: No Undermining: No Wound Description Classification: Full Thickness With Exposed Support Structures Wound Margin: Epibole Exudate Amount: Medium Exudate Type: Serosanguineous Exudate Color: red, brown Foul Odor After Cleansing: No Slough/Fibrino Yes Wound Bed Granulation Amount: Medium (34-66%) Exposed Structure Granulation Quality: Red, Pink Fascia Exposed: No Necrotic Amount: Medium (34-66%) Fat Layer (Subcutaneous Tissue) Exposed: Yes Necrotic Quality: Adherent Slough Tendon Exposed:  No Muscle Exposed: Yes Necrosis of Muscle: No Joint Exposed: No Bone Exposed: No Treatment Notes Wound #1 (Lower Leg) Wound Laterality: Left, Lateral Cleanser Soap and Water Discharge Instruction: Gently cleanse wound with antibacterial soap, rinse and pat dry prior to dressing wounds [name], [name] ([id number]) Peri-Wound Care Topical Triamcinolone Acetonide Cream, 0.1%, 15 (g) tube Discharge Instruction: to wound bed , lightly Primary Dressing Secondary Dressing Mepilex Border Flex, 6x6 (in/in) Discharge Instruction: Apply to wound as directed. Do not cut. Secured With Compression Wrap Compression Stockings Add-Ons tubi grip F Discharge Instruction: apply double layer to lower leg Electronic Signature(s) Signed: 09/18/2020 5:22:07 PM By: Carlene Coria RN Entered By: Carlene Coria on 09/18/2020 08:42:44 [name] ([id number]) -------------------------------------------------------------------------------- Vitals Details Patient Name: [name] Date of Service: 09/18/2020 8:15 AM Medical Record Number: [id number] Patient Account Number: [id number] Date of Birth/Sex: December 20, 1980 (39 y.o. M) Treating RN: Carlene Coria Primary Care Dishon Kehoe: [name] Other Clinician: Referring Nea Gittens: [name] Treating Elantra Caprara/Extender: [name] Cliche in Treatment: 197 Vital Signs Time Taken: 08:34 Temperature (F): 98.3 Height (in): 71 Pulse (bpm): 83 Weight (lbs): 338 Respiratory Rate (breaths/min): 18 Body Mass Index (BMI): 47.1 Blood Pressure (mmHg): 152/79 Reference Range: 80 - 120 mg / dl Electronic Signature(s) Signed: 09/18/2020 5:22:07 PM By: Carlene Coria RN Entered By: Carlene Coria on 09/18/2020 08:34:30

## 2020-09-18 NOTE — Progress Notes (Addendum)
YUNIS, VOORHEIS ([id number]) Visit Report for 09/18/2020 Chief Complaint Document Details Patient Name: [name], [name] Date of Service: 09/18/2020 8:15 AM Medical Record Number: [id number] Patient Account Number: [id number] Date of Birth/Sex: 1981-02-03 (40 y.o. 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 M) Treating RN: Carlene Coria Primary Care Provider: [name] Other Clinician: Referring Provider: [name] Treating Provider/Extender: [name] Cliche in Treatment: 197 History of Present Illness HPI Description: 12/04/16; 40 year old man who comes into the clinic today for review of a wound on the posterior left calf. He tells me that is been there for about a year. He is not a diabetic he does smoke half a pack per day. He was seen in the ER on 11/20/16 felt to have cellulitis around the wound and was given clindamycin. An x-ray did not show osteomyelitis. The patient initially tells me that he has a milk allergy that sets off a pruritic itching rash on his lower legs which she scratches incessantly and he thinks that's what may have set up the wound. He has been using various topical antibiotics and ointments without any effect. He works in a [employer] and is on his feet all day. He does not have a prior history of  wounds however he does have the rash on both lower legs the right arm and the ventral aspect of his left arm. These are excoriations and clearly have had scratching however there are of macular looking areas on both legs including a substantial larger area on the right leg. This does not have an underlying open area. There is no blistering. The patient tells me that 2 years ago in [place] in response to the rash on his legs he saw a dermatologist who told him he had a condition which may be pyoderma gangrenosum although I may be putting words into his mouth. He seemed to recognize this. 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 He is not in any treatment for this. He's had no recent travel 12/11/16; the patient arrives today with his wound and roughly the same condition we've been using silver alginate this is a deep punched out wound with some surrounding erythema but no tenderness. Biopsy I did did not show confirmed pyoderma gangrenosum suggested nonspecific inflammation and vasculitis but does not provide an actual description of what was seen by the pathologist. I'm really not able to understand this We have also received information from the patient's dermatologist in [place] notes from April 2016. This was a doctor Agarwal-antal. 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 He was prescribed topical steroid high potency under occlusion which helped but at this point the patient did not have a deep punched out wound. 12/18/16; the patient's wound is larger in terms of surface area however this surface looks better and there is less depth. The surrounding erythema also is better. The patient states that the wrap we put on came off 2 days ago when he has been using his compression stockings.  He we are in the process of getting a dermatology consult. 12/26/16 on evaluation today patient's left lower extremity wound shows evidence of infection with  surrounding erythema noted. He has been tolerating the dressing changes but states that he has noted more discomfort. There is a larger area of erythema surrounding the wound. No fevers, chills, nausea, or vomiting noted at this time. With that being said the wound still does have slough covering the surface. He is not allergic to any medication that he is aware of at this point. In regard to his right lower extremity he had several regions that are erythematous and pruritic he wonders if there's anything we can do to help that. 01/02/17 I reviewed patient's wound culture which was obtained his visit last week. He was placed on doxycycline at that point. Unfortunately that does not appear to be an antibiotic that would likely help with the situation however the pseudomonas noted on culture is sensitive to Cipro. Also unfortunately patient's wound seems to have a large compared to last week's evaluation. Not severely so but there are definitely increased measurements in general. He is continuing to have discomfort as well he writes this to be a seven out of 10. In fact he would prefer me not to perform any debridement today due to the fact that he is having discomfort and considering he has an active infection on the little reluctant to do so anyway. No fevers, chills, nausea, or vomiting noted at this time. 01/08/17; patient seems dermatology on September 5. I suspect dermatology will want the slides from the biopsy I did sent to their pathologist. I'm not sure if there is a way we can expedite that. In any case the culture I did before I left on vacation 3 weeks ago showed Pseudomonas he was given 10 days of Cipro and per her description of her intake nurses is actually somewhat better this week although the wound is quite a bit bigger than I remember the last time I saw this. He still has 3 more days of Cipro 01/21/17; dermatology appointment tomorrow. He has completed the ciprofloxacin for Pseudomonas.  Surface of the wound looks better however he is had some deterioration in the lesions on his right leg. Meantime the left lateral leg wound we will continue with sample 01/29/17; patient had his dermatology appointment but I can't yet see that note. He is completed his antibiotics. The wound is more superficial but considerably larger in circumferential area than when he came in. This is in his left lateral calf. He also has swollen erythematous areas with superficial wounds on the right leg and small papular areas on both arms. There apparently areas in her his upper thighs and buttocks I did not look at those. Dermatology biopsied the right leg. Hopefully will have their input next week. 02/05/17; patient went back to see his dermatologist who told him that he had a "scratching problem" as well as staph. He is now on a 30 day course of doxycycline and I believe she gave him triamcinolone cream to the right leg areas to help with the itching [not exactly sure but probably triamcinolone]. She apparently looked at the left lateral leg wound although this was not rebiopsied and I think felt to be ultimately part of the same pathogenesis. He is using sample border foam and changing nevus himself. He now has a new open area on the right posterior leg which was his biopsy site I don't have any of the dermatology notes  02/12/17; we put the patient in compression last week with SANTYL to the wound on the left leg and the biopsy. Edema is much better and the depth of the wound is now at level of skin. Area is still the same oBiopsy site on the right lateral leg we've also been using santyl with a border foam dressing and he is changing this himself. 02/19/17; Using silver alginate started last week to both the substantial left leg wound and the biopsy site on the right wound. He is tolerating compression well. Has a an appointment with his primary M.D. tomorrow wondering about diuretics although I'm wondering if  the edema problem is actually lymphedema 02/26/17; the patient has been to see his primary doctor Dr. Jerrel Ivory at [place] our primary care. She started him on Lasix 20 mg and this seems to have helped with the edema. However we are not making substantial change with the left lateral calf wound and inflammation. The biopsy site on the right leg also looks stable but not really all that different. 03/12/17; the patient has been to see vein and vascular Dr. Lucky Cowboy. He has had venous reflux studies I have not reviewed these. I did get a call from his dermatology office. They felt that he might have pathergy based on their biopsy on his right leg which led them to look at the slides of TRUITT, CRUEY ([id number]) the biopsy I did on the left leg and they wonder whether this represents pyoderma gangrenosum which was the original supposition in a man with ulcerative colitis albeit inactive for many years. They therefore recommended clobetasol and tetracycline i.e. 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 03/26/17; apparently the patient just had reflux studies not an appointment with Dr. dew. She arrives in clinic today having applied clobetasol for 2-3 weeks. He notes over the last 2-3 days excessive drainage having to change the dressing 3-4 times a day and also expanding erythema. 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 Lillia Carmel at [facility] dermatology locally who kindly saw him at my request. 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 My concern was the degree of erythema medially extending from his left leg wound which was either inflammation from pyoderma or cellulitis. I  put him on Augmentin however culture of the wound showed Pseudomonas which is quinolone sensitive. I really don't believe he has cellulitis however in view of everything I will continue and give him a course of Cipro. He is also on doxycycline as an immune modulator for the pyoderma. In addition to his original wound on the left lateral leg with surrounding erythema he has a wound on the right posterior calf which was an original biopsy site done by dermatology. 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 Saw Dr. Lillia Carmel yesterday. He has been using topical antibiotics to both wound areas his original wound on the left and the biopsies/pathergy area on the right. There is definitely some improvement in the inflammation around the wound on the right although the patient states he has increasing sensitivity of the wounds. He is on prednisone 60 and doxycycline 1 as prescribed by Dr. Lillia Carmel. He is covering the topical antibiotic with gauze  and putting this in his own compression stocks and changing this daily. He states that Dr. Lottie Rater did a culture of the left leg wound yesterday 05/07/17; pyoderma gangrenosum. The patient saw Dr. Lillia Carmel yesterday and has a follow-up with her in one month. He is still using topical antibiotics to both wounds although he can't recall exactly what type. He is still on prednisone 60 mg. Dr. Lillia Carmel stated that the doxycycline could stop if we were in agreement. 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 The right medial leg was induced by biopsy/pathergy. The area on the right is essentially healed. 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 The right medial leg has closed and remains closed. He is still on  prednisone 60. oHe tells me he missed his last dermatology appointment with Dr. Lillia Carmel but will make another appointment. He reports that her blood sugar at a recent screen in [place] was high 200's. He was 180 today. 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 I think he is going to require still much longer prednisone perhaps another 3 months before attempting to taper. In the meantime his wound is a lot better. Smaller. He is cleaning this off daily and applying topical antibiotics. When he was last in the clinic I thought about changing to [facility] and actually put in a couple of calls to dermatology although probably not during their business hours. 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 He continues on oral prednisone. He has been using triple antibiotic ointment. There is surface debris and we will transition to [facility] and have him return in 2 weeks. He has lost 30 pounds since his last appointment with lifestyle modification. He may benefit from topical steroid cream for treatment this can be considered at a later date. 08/22/17 on evaluation today patient appears to actually be doing rather well in regard to his left lateral lower extremity ulcer. He has actually been managed by Dr. Dellia Nims most recently. Patient is currently on oral steroids at this time. This seems to have been of benefit for him. Nonetheless his last visit was actually with Leah on 08/06/17. Currently he is not utilizing any topical steroid creams although this could be of benefit as well. No fevers, chills, nausea, or vomiting noted at this time. 09/05/17 on evaluation today patient appears to be doing better in regard to his left lateral lower extremity ulcer. He has been tolerating the dressing changes without complication. He is using Santyl with good effect. Overall I'm very  pleased with how things are standing at this point. Patient likewise is happy that this is doing better. 09/19/17 on evaluation today patient actually appears to be doing rather well in regard to his left lateral lower extremity ulcer. 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 He's not having any significant pain. 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 He has been tolerating the Santyl without complication and in general I feel like he's making good progress. 10/17/17 on evaluation today patient  appears to be doing very well in regard to his left lateral lower surety ulcer. He has been tolerating the dressing changes without complication. There does not appear to be any evidence of infection he's alternating the Santyl and the triple antibiotic ointment every other day this seems to be doing well for him. 11/03/17 on evaluation today patient appears to be doing very well in regard to his left lateral lower extremity ulcer. He is been tolerating the dressing changes without complication which is good news. Fortunately there does not appear to be any evidence of infection which is also great news. Overall is doing excellent they are starting to taper down on the prednisone is down to 40 mg at this point it also started topical clobetasol for him. 11/17/17 on evaluation today patient appears to be doing well in regard to his left lateral lower surety ulcer. He's been tolerating the dressing changes without complication. He does note that he is having no pain, no excessive drainage or discharge, and overall he feels like things are going about how he would expect and hope they would. Overall he seems to have no evidence of infection at this time in my opinion which is good news. 12/04/17-He is seen in follow-up evaluation  for right lateral lower extremity ulcer. He has been applying topical steroid cream. Today's measurement show slight increase in size. Over the next 2 weeks we will transition to every other day Santyl and steroid cream. He has been encouraged to monitor for changes and notify clinic with any concerns 12/15/17 on evaluation today patient's left lateral motion the ulcer and fortunately is doing worse again at this point. This just since last week to this week has close to doubled in size according to the patient. I did not seeing last week's I do not have a visual to compare this to in our system was also down so we do not have all the charts and at this point. Nonetheless it does have me somewhat concerned in regard to the fact that again he was worried enough about it he has contact the dermatology that placed them back on the full strength, 50 mg a day of the prednisone that he was taken previous. He continues to alternate using clobetasol along with Santyl at this point. He is obviously somewhat frustrated. 12/22/17 on evaluation today patient appears to be doing a little worse compared to last evaluation. Unfortunately the wound is a little deeper and slightly larger than the last week's evaluation. With that being said he has made some progress in regard to the irritation surrounding at this time unfortunately despite that progress that's been made he still has a significant issue going on here. 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 BAYAN, KUSHNIR ([id number]) For that reason I discussed with him today the possibility of perform a wound culture to ensure there's nothing overtly infected. 01/06/18 on evaluation today patient's wound is larger and deeper than previously evaluated. With that being said it did appear that his wound was infected after  my last evaluation with him. Subsequently I did end up prescribing a prescription for Bactrim DS which she has been taking and having no complication with. Fortunately there does not appear to be any evidence of infection at this point in time as far as anything spreading, no want to touch, and overall I feel like  things are showing signs of improvement. 01/13/18 on evaluation today patient appears to be even a little larger and deeper than last time. There still muscle exposed in the base of the wound. Nonetheless he does appear to be less erythematous I do believe inflammation is calming down also believe the infection looks like it's probably resolved at this time based on what I'm seeing. No fevers, chills, nausea, or vomiting noted at this time. 01/30/18 on evaluation today patient actually appears to visually look better for the most part. Unfortunately those visually this looks better he does seem to potentially have what may be an abscess in the muscle that has been noted in the central portion of the wound. This is the first time that I have noted what appears to be fluctuance in the central portion of the muscle. With that being said I'm somewhat more concerned about the fact that this might indicate an abscess formation at this location. I do believe that an ultrasound would be appropriate. This is likely something we need to try to do as soon as possible. He has been switch to mupirocin ointment and he is no longer using the steroid ointment as prescribed by dermatology he sees them again next week he's been decreased from 60 to 40 mg of prednisone. 03/09/18 on evaluation today patient actually appears to be doing a little better compared to last time I saw him. There's not as much erythema surrounding the wound itself. He I did review his most recent infectious disease note which was dated 02/24/18. He saw Dr. Michel Bickers in [place]. 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 Patient is now off of antibiotics and they are continually observing this. There seems to be no change in the past two weeks in my pinion based on what the patient says and what I see today compared to what Dr. Megan Salon likely saw two weeks ago. No fevers, chills, nausea, or vomiting noted at this time. 03/23/18 on evaluation today patient's wound actually appears to be showing signs of improvement which is good news. He is currently still on the Dapsone. He is also working on tapering the prednisone to get off of this and Dr. Lottie Rater is working with him in this regard. Nonetheless overall I feel like the wound is doing well it does appear based on the infectious disease note that I reviewed from Dr. 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 I did also review the results of his ultrasound of left lower extremity which revealed there was a dentist tissue in the base of the wound without an abscess noted. 04/06/18 on evaluation today the patient's left lateral lower extremity ulcer actually appears to be doing fairly well which is excellent news. There does not appear to be any evidence of infection at this time which is also great news. Overall he still does have a significantly large ulceration although little by little he seems to be making progress. He is down to 10 mg a day of the prednisone. 04/20/18 on evaluation today patient actually appears to be doing excellent at this time in regard to his left lower extremity ulcer. He's making signs of good progress unfortunately this is taking much longer than we would really like to see but nonetheless he is  making progress. Fortunately there does not appear to be any evidence of infection at this time. No fevers, chills, nausea, or vomiting noted at this time.  The patient has not been using the Santyl due to the cost he hadn't got in this field yet. He's mainly been using the antibiotic ointment topically. Subsequently he also tells me that he really has not been scrubbing in the shower I think this would be helpful again as I told him it doesn't have to be anything too aggressive to even make it believe just enough to keep it free of some of the loose slough and biofilm on the wound surface. 05/11/18 on evaluation today patient's wound appears to be making slow but sure progress in regard to the left lateral lower extremity ulcer. He is been tolerating the dressing changes without complication. Fortunately there does not appear to be any evidence of infection at this time. He is still just using triple antibiotic ointment along with clobetasol occasionally over the area. He never got the Santyl and really does not seem to intend to in my pinion. 06/01/18 on evaluation today patient appears to be doing a little better in regard to his left lateral lower extremity ulcer. He states that overall he does not feel like he is doing as well with the Dapsone as he did with the prednisone. Nonetheless he sees his dermatologist later today and is gonna talk to them about the possibility of going back on the prednisone. Overall again I believe that the wound would be better if you would utilize Santyl but he really does not seem to be interested in going back to the [place] at this point. He has been using triple antibiotic ointment. 06/15/18 on evaluation today patient's wound actually appears to be doing about the same at this point. Fortunately there is no signs of infection at this time. He has made slight improvements although he continues to not really want to clean the wound bed at this point. He states that he just doesn't mess with it he doesn't want to cause any problems with everything else he has going on. He has been on medication, antibiotics as prescribed  by his dermatologist, for a staff infection of his lower extremities which is really drying out now and looking much better he tells me. Fortunately there is no sign of overall infection. 06/29/18 on evaluation today patient appears to be doing well in regard to his left lateral lower surety ulcer all things considering. Fortunately his staff infection seems to be greatly improved compared to previous. He has no signs of infection and this is drying up quite nicely. He is still the doxycycline for this is no longer on cental, Dapsone, or any of the other medications. His dermatologist has recommended possibility of an infusion but right now he does not want to proceed with that. 07/13/18 on evaluation today patient appears to be doing about the same in regard to his left lateral lower surety ulcer. Fortunately there's no signs of infection at this time which is great news. Unfortunately he still builds up a significant amount of Slough/biofilm of the surface of the wound he still is not really cleaning this as he should be appropriately. Again I'm able to easily with saline and gauze remove the majority of this on the surface which if you would do this at home would likely be a dramatic improvement for him as far as getting the area to improve. Nonetheless overall I still feel like he  is making progress is just very slow. I think Santyl will be of benefit for him as well. Still he has not gotten this as of this point. 07/27/18 on evaluation today patient actually appears to be doing little worse in regards of the erythema around the periwound region of the wound he also tells me that he's been having more drainage currently compared to what he was experiencing last time I saw him. He states not quite as bad as what he had because this was infected previously but nonetheless is still appears to be doing poorly. Fortunately there is no evidence of systemic infection at this point. The patient tells me that  he is not going to be able to afford the Santyl. He is still waiting to hear about the infusion therapy with his dermatologist. Apparently she wants an updated colonoscopy first. 08/10/18 on evaluation today patient appears to be doing better in regard to his left lateral lower extremity ulcer. Fortunately he is showing signs of improvement in this regard he's actually been approved for Remicade infusion's as well although this has not been scheduled as of yet. Fortunately there's no signs of active infection at this time in regard to the wound although he is having some issues with infection of the right lower extremity is been seen as dermatologist for this. Fortunately they are definitely still working with him trying to keep things under control. AGUSTUS, MANE ([id number]) 09/07/18 on evaluation today patient is actually doing rather well in regard to his left lateral lower extremity ulcer. He notes these actually having some hair grow back on his extremity which is something he has not seen in years. He also tells me that the pain is really not giving them any trouble at this time which is also good news overall she is very pleased with the progress he's using a combination of the mupirocin along with the probate is all mixed. 09/21/18 on evaluation today patient actually appears to be doing fairly well all things considered in regard to his looks from the ulcer. He's been tolerating the dressing changes without complication. Fortunately there's no signs of active infection at this time which is good news he is still on all antibiotics or prevention of the staff infection. He has been on prednisone for time although he states it is gonna contact his dermatologist and see if she put them on a short course due to some irritation that he has going on currently. Fortunately there's no evidence of any overall worsening this is going very slow I think cental would be something that would be helpful for him  although he states that $50 for tube is quite expensive. He therefore is not willing to get that at this point. 10/06/18 on evaluation today patient actually appears to be doing decently well in regard to his left lateral leg ulcer. He's been tolerating the dressing changes without complication. Fortunately there's no signs of active infection at this time. Overall I'm actually rather pleased with the progress he's making although it's slow he doesn't show any signs of infection and he does seem to be making some improvement. I do believe that he may need a switch up and dressings to try to help this to heal more appropriately and quickly. 10/19/18 on evaluation today patient actually appears to be doing better in regard to his left lateral lower extremity ulcer. This is shown signs of having much less Slough buildup at this point due to the fact he has been using  the Santyl. Obviously this is very good news. The overall size of the wound is not dramatically smaller but again the appearance is. 11/02/18 on evaluation today patient actually appears to be doing quite well in regard to his lower [place] ulcer. A lot of the skin around the ulcer is actually somewhat irritating at this point this seems to be more due to the dressing causing irritation from the adhesive that anything else. Fortunately there is no signs of active infection at this time. 11/24/18 on evaluation today patient appears to be doing a little worse in regard to his overall appearance of his lower extremity ulcer. There's more erythema and warmth around the wound unfortunately. He is currently on doxycycline which he has been on for some time. With that being said I'm not sure that seems to be helping with what appears to possibly be an acute cellulitis with regard to his left lower extremity ulcer. No fevers, chills, nausea, or vomiting noted at this time. 12/08/18 on evaluation today patient's wounds actually appears to be doing  significantly better compared to his last evaluation. He has been using Santyl along with alternating tripling about appointment as well as the steroid cream seems to be doing quite well and the wound is showing signs of improvement which is excellent news. Fortunately there's no evidence of infection and in fact his culture came back negative with only normal skin flora noted. 12/21/2018 upon evaluation today patient actually appears to be doing excellent with regard to his ulcer. This is actually the best that I have seen it since have been helping to take care of him. It is both smaller as well as less slough noted on the surface of the wound and seems to be showing signs of good improvement with new skin growing from the edges. He has been using just the triamcinolone he does wonder if he can get a refill of that ointment today. 01/04/2019 upon evaluation today patient actually appears to be doing well with regard to his left lateral lower extremity ulcer. With that being said it does not appear to be that he is doing quite as well as last time as far as progression is concerned. There does not appear to be any signs of infection or significant irritation which is good news. With that being said I do believe that he may benefit from switching to a collagen based dressing based on how clean The wound appears. 01/18/2019 on evaluation today patient actually appears to be doing well with regard to his wound on the left lower extremity. He is not made a lot of progress compared to where we were previous but nonetheless does seem to be doing okay at this time which is good news. There is no signs of active infection which is also good news. My only concern currently is I do wish we can get him into utilizing the collagen dressing his insurance would not pay for the supplies that we ordered although it appears that he may be able to order this through his supply company that he typically utilizes. This is  [employer]. Nonetheless he did try to order it during the office visit today and it appears this did go through. We will see if he can get that it is a different brand but nonetheless he has collagen and I do think will be beneficial. 02/01/2019 on evaluation today patient actually appears to be doing a little worse today in regard to the overall size of his wounds. Fortunately there  is no signs of active infection at this time. That is visually. Nonetheless when this is happened before it was due to infection. For that reason were somewhat concerned about that this time as well. 02/08/2019 on evaluation today patient unfortunately appears to be doing slightly worse with regard to his wound upon evaluation today. Is measuring a little deeper and a little larger unfortunately. I am not really sure exactly what is causing this to enlarge he actually did see his dermatologist she is going to see about initiating Humira for him. Subsequently she also did do steroid injections into the wound itself in the periphery. Nonetheless still nonetheless he seems to be getting a little bit larger he is gone back to just using the steroid cream topically which I think is appropriate. I would say hold off on the collagen for the time being is definitely a good thing to do. Based on the culture results which we finally did get the final result back regarding it shows staph as the bacteria noted again that can be a normal skin bacteria based on the fact however he is having increased drainage and worsening of the wound measurement wise I would go ahead and place him on an antibiotic today I do believe for this. 02/15/2019 on evaluation today patient actually appears to be doing somewhat better in regard to his ulcer. There is no signs of worsening at this time I did review his culture results which showed evidence of Staphylococcus aureus but not MRSA. Again this could just be more related to the normal skin bacteria  although he states the drainage has slowed down quite a bit he may have had a mild infection not just colonization. And was much smaller and then since around10/04/2019 on evaluation today patient appears to be doing unfortunately worse as far as the size of the wound. I really feel like that this is steadily getting larger again it had been doing excellent right at the beginning of September we have seen a steady increase in the area of the wound it is almost 2-1/2 times the size it was on September 1. Obviously this is a bad trend this is not wanting to see. For that reason we went back to using just the topical triamcinolone cream which does seem to help with inflammation. I checked him for bacteria by way of culture and nothing showed positive there. I am considering giving him a short course of a tapering steroid Dosepak today to see if that is can be beneficial for him. The patient is in agreement with giving that a try. 03/08/2019 on evaluation today patient appears to be doing very well in comparison to last evaluation with regard to his lower extremity ulcer. This is showing signs of less inflammation and actually measuring slightly smaller compared to last time every other week over the past month and a half he has been measuring larger larger larger. Nonetheless I do believe that the issue has been inflammation the prednisone does seem to [facility], [place] ([id number]) have been beneficial for him which is good news. No fevers, chills, nausea, vomiting, or diarrhea. 03/22/2019 on evaluation today patient appears to be doing about the same with regard to his leg ulcer. He has been tolerating the dressing changes without complication. With that being said the wound seems to be mostly arrested at its current size but really is not making any progress except for when we prescribed the prednisone. He did show some signs of dropping as far as  the overall size of the wound during that interval week.  Nonetheless this is something he is not on long-term at this point and unfortunately I think he is getting need either this or else the Humira which his dermatologist has discussed try to get approval for. With that being said he will be seeing his dermatologist on the 11th of this month that is November. 04/19/2019 on evaluation today patient appears to be doing really about the same the wound is measuring slightly larger compared to last time I saw him. He has not been into the office since November 2 due to the fact that he unfortunately had Covid as that his entire family. He tells me that it was rough but they did pull-through and he seems to be doing much better. Fortunately there is no signs of active infection at this time. No fevers, chills, nausea, vomiting, or diarrhea. 05/10/2019 on evaluation today patient unfortunately appears to be doing significantly worse as compared to last time I saw him. He does tell me that he has had his first dose of Humira and actually is scheduled to get the next one in the upcoming week. With that being said he tells me also that in the past several days he has been having a lot of issues with green drainage she showed me a picture this is more blue-green in color. He is also been having issues with increased sloughy buildup and the wound does appear to be larger today. Obviously this is not the direction that we want everything to take based on the starting of his Humira. Nonetheless I think this is definitely a result of likely infection and to be honest I think this is probably Pseudomonas causing the infection based on what I am seeing. 05/24/2019 on evaluation today patient unfortunately appears to be doing significantly worse compared to his prior evaluation with me 2 weeks ago. I did review his culture results which showed that he does have Staph aureus as well as Pseudomonas noted on the culture. Nonetheless the Levaquin that I prescribed for him does  not appear to have been appropriate and in fact he tells me he is no longer experiencing the green drainage and discharge that he had at the last visit. Fortunately there is no signs of active infection at this time which is good news although the wound has significantly worsened it in fact is much deeper than it was previous. We have been utilizing up to this point triamcinolone ointment as the prescription topical of choice but at this time I really feel like that the wound is getting need to be packed in order to appropriately manage this due to the deeper nature of the wound. Therefore something along the lines of an alginate dressing may be more appropriate. 05/31/2019 upon inspection today patient's wound actually showed signs of doing poorly at this point. Unfortunately he just does not seem to be making any good progress despite what we have tried. He actually did go ahead and pick up the Cipro and start taking that as he was noticing more green drainage he had previously completed the Levaquin that I prescribed for him as well. Nonetheless he missed his appointment for the seventh last week on Wednesday with the wound care center and [facility] where his dermatologist referred him. Obviously I do think a second opinion would be helpful at this point especially in light of the fact that the patient seems to be doing so poorly despite the fact  that we have tried everything that I really know how at this point. The only thing that ever seems to have helped him in the past is when he was on high doses of continual steroids that did seem to make a difference for him. 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 06/07/2019 upon evaluation today patient unfortunately appears to be doing worse yet again with regard to his wound. In fact I am  starting to question whether or not he may have a fluid pocket in the muscle at this point based on the bulging and the soft appearance to the central portion of the muscle area. There is not anything draining from the muscle itself at this time which is good news but nonetheless the wound is expanding. I am not really seeing any results of the Humira as far as overall wound progression based on what I am seeing at this point. The patient has been referred for second opinion with regard to his wound to the [facility] wound care center by his dermatologist which I definitely am not in opposition to. Unfortunately we tried multiple dressings in the past including collagen, alginate, and at one point even Hydrofera Blue. With that being said he is never really used it for any significant amount of time due to the fact that he often complains of pain associated with these dressings and then will go back to either using the Santyl which she has done intermittently or more frequently the triamcinolone. He is also using his own compression stockings. We have wrapped him in the past but again that was something else that he really was not a big fan of. 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 He seemed to do much better when he was on the steroids although honestly I understand that the steroids are not necessarily the best medication to be on long-term obviously 06/14/2019 on evaluation today patient appears to be doing actually a little bit better with regard to the overall appearance with his leg. Unfortunately he does continue to have issues with what appears to be some fluid underneath the  muscle although he did see the wound specialty center at [facility] last week their main goals were to see about infusion therapy in place of the Humira as they feel like that is not quite strong enough. They also recommended that we continue with the treatment otherwise as we are they felt like that was appropriate and they are okay with him continuing to follow-up here with [place] in that regard. With that being said they are also sending him to the vein specialist there to see about vein stripping and if that would be of benefit for him. Subsequently they also did not really address whether or not an ultrasound of the muscle area to see if there is anything that needs to be addressed here would be appropriate or not. For that reason I discussed this with him last week I think we  may proceed down that road at this point. 06/21/2019 upon evaluation today patient's wound actually appears to be doing slightly better compared to previous evaluations. I do believe that he has made a difference with regard to the progression here with the use of oral steroids. Again in the past has been the only thing that is really calm things down. He does tell me that from [facility] is gotten a good news from there that there are no further vein stripping that is necessary at this point. I do not have that available for review today although the patient did relay this to me. He also did obtain and have the ultrasound of the wound completed which I did sign off on today. It does appear that there is no fluid collection under the muscle this is likely then just edematous tissue in general. That is also good news. Overall I still believe the inflammation is the main issue here. He did inquire about the possibility of a wound VAC again with the muscle protruding like it is I am not really sure whether the wound VAC is necessarily ideal or not. That is something we will have to consider although I do believe he may need compression wrapping to  try to help with edema control which could potentially be of benefit. 06/28/2019 on evaluation today patient appears to be doing slightly better measurement wise although this is not terribly smaller he least seems to be trending towards that direction. With that being said he still seems to have purulent drainage noted in the wound bed at this time. He has been on Levaquin followed by Cipro over the past month. Unfortunately he still seems to have some issues with active infection at this time. I did perform a culture last week in order to evaluate and see if indeed there was still anything going on. Subsequently the culture did come back showing Pseudomonas which is consistent with the drainage has been having which is blue-green in color. He also has had an odor that again was somewhat consistent with Pseudomonas as well. Long story short it appears that the culture showed an intermediate finding with regard to how well the Cipro will work for the Pseudomonas infection. Subsequently being that he does not seem to be clearing up and at best what we are doing is just keeping this at [place] I think he may need to see infectious disease to discuss IV antibiotic options. DORRIEN, GRUNDER ([id number]) 07/05/2019 upon evaluation today patient appears to be doing okay in regard to his leg ulcer. He has been tolerating the dressing changes at this point without complication. Fortunately there is no signs of active infection at this time which is good news. No fevers, chills, nausea, vomiting, or diarrhea. With that being said he does have an appointment with infectious disease tomorrow and his primary care on Wednesday. Again the reason for the infectious disease referral was due to the fact that he did not seem to be fully resolving with the use of oral antibiotics and therefore we were thinking that IV antibiotic therapy may be necessary secondary to the fact that there was an intermediate finding for  how effective the Cipro may be. Nonetheless again he has been having a lot of purulent and even green drainage. Fortunately right now that seems to have calmed down over the past week with the reinitiation of the oral antibiotic. Nonetheless we will see what Dr. Megan Salon has to say. 07/12/2019 upon evaluation today patient appears to be  doing about the same at this point in regard to his left lower extremity ulcer. Fortunately there is no signs of active infection at this time which is good news I do believe the Levaquin has been beneficial I did review Dr. Hale Bogus note and to be honest I agree that the patient's leg does appear to be doing better currently. What we found in the past as he does not seem to really completely resolve he will stop the antibiotic and then subsequently things will revert back to having issues with blue-green drainage, increased pain, and overall worsening in general. Obviously that is the reason I sent him back to infectious disease. 07/19/2019 upon evaluation today patient appears to be doing roughly the same in size there is really no dramatic improvement. He has started back on the Levaquin at this point and though he seems to be doing okay he did still have a lot of blue/green drainage noted on evaluation today unfortunately. I think that this is still indicative more likely of a Pseudomonas infection as previously noted and again he does see Dr. Megan Salon in just a couple of days. I do not know that were really able to effectively clear this with just oral antibiotics alone based on what I am seeing currently. Nonetheless we are still continue to try to manage as best we can with regard to the patient and his wound. I do think the wrap was helpful in decreasing the edema which is excellent news. No fevers, chills, nausea, vomiting, or diarrhea. 07/26/2019 upon evaluation today patient appears to be doing slightly better with regard to the overall appearance of the muscle  there is no dark discoloration centrally. Fortunately there is no signs of active infection at this time. No fevers, chills, nausea, vomiting, or diarrhea. Patient's wound bed currently the patient did have an appointment with Dr. Megan Salon at infectious disease last week. With that being said Dr. Megan Salon the patient states was still somewhat hesitant about put him on any IV antibiotics he wanted [place] to repeat cultures today and then see where things go going forward. He does look like Dr. Megan Salon because of some improvement the patient did have with the Levaquin wanted [place] to see about repeating cultures. If it indeed grows the Pseudomonas again then he recommended a possibility of considering a PICC line placement and IV antibiotic therapy. He plans to see the patient back in 1 to 2 weeks. 08/02/2019 upon evaluation today patient appears to be doing poorly with regard to his left lower extremity. We did get the results of his culture back it shows that he is still showing evidence of Pseudomonas which is consistent with the purulent/blue-green drainage that he has currently. Subsequently the culture also shows that he now is showing resistance to the oral fluoroquinolones which is unfortunate as that was really the only thing to treat the infection prior. I do believe that he is looking like this is going require IV antibiotic therapy to get this under control. Fortunately there is no signs of systemic infection at this time which is good news. The patient does see Dr. Megan Salon tomorrow. 08/09/2019 upon evaluation today patient appears to be doing better with regard to his left lower extremity ulcer in regard to the overall appearance. He is currently on IV antibiotic therapy. As ordered by Dr. Megan Salon. Currently the patient is on ceftazidime which she is going to take for the next 2 weeks and then follow-up for 4 to 5-week appointment with Dr. Megan Salon.  The patient started this this past Friday  symptoms have not for a total of 3 days currently in full. 08/16/2019 upon evaluation today patient's wound actually does show muscle in the base of the wound but in general does appear to be much better as far as the overall evidence of infection is concerned. In fact I feel like this is for the most part cleared up he still on the IV antibiotics he has not completed the full course yet but I think he is doing much better which is excellent news. 08/23/2019 upon evaluation today patient appears to be doing about the same with regard to his wound at this point. He tells me that he still has pain unfortunately. Fortunately there is no evidence of systemic infection at this time which is great news. There is significant muscle protrusion. 09/13/19 upon evaluation today patient appears to be doing about the same in regard to his leg unfortunately. He still has a lot of drainage coming from the ulceration there is still muscle exposed. With that being said the patient's last wound culture still showed an intermediate finding with regard to the Pseudomonas he still having the bluish/green drainage as well. Overall I do not know that the wound has completely cleared of infection at this point. Fortunately there is no signs of active infection systemically at this point which is good news. 09/20/2019 upon evaluation today patient's wound actually appears to be doing about the same based on what I am seeing currently. I do not see any signs of systemic infection he still does have evidence of some local infection and drainage. He did see Dr. Megan Salon last week and Dr. Megan Salon states that he probably does need a different IV antibiotic although he does not want to put him on this until the patient begins the Remicade infusion which is actually scheduled for about 10 days out from today on 13 May. Following that time Dr. Megan Salon is good to see him back and then will evaluate the feasibility of starting him on the  IV antibiotic therapy once again at that point. I do not disagree with this plan I do believe as Dr. Megan Salon stated in his note that I reviewed today that the patient's issue is multifactorial with the pyoderma being 1 aspect of this that were hoping the Remicade will be helpful for her. In the meantime I think the gentamicin is, helping to keep things under decent okay control in regard to the ulcer. 09/27/2019 upon evaluation today patient appears to be doing about the same with regard to his wound still there is a lot of muscle exposure though he does have some hyper granulation tissue noted around the edge and actually some granulation tissue starting to form over the muscle which is actually good news. Fortunately there is no evidence of active infection which is also good news. His pain is less at this point. 5/21; this is a patient I have not seen in a long time. 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 He has a large wound on the left lateral leg with protruding muscle. He comes in the clinic today showing the same area on his left medial ankle. He says there is been a spot there for some time although we have not previously defined this. Today he has a clearly defined area with slight amount of skin breakdown surrounded by raised areas with a purplish hue in color. This is not painful he says it is irritated.  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 He still has muscle protruding from the lateral portion of his left leg but fortunately the new area that they were concerned about at his last visit does not appear to have opened at this point. He is currently on Remicade infusions and seems to be doing better in my opinion in fact the wound itself seems to be overall much better. The purplish discoloration that he did have seems to have resolved  and I think that is a good sign that hopefully the Remicade is doing its job. He does have some biofilm noted over the surface of the wound. 11/01/2019 on evaluation today patient's wound actually appears to be doing excellent at this time. Fortunately there is no evidence of active infection and overall I feel like he is making great progress. The Remicade seems to be due excellent job in my opinion. SHELBY, ANDERLE ([id number]) 11/08/19 evaluation today vision actually appears to be doing quite well with regard to his weight ulcer. He's been tolerating dressing changes without complication. Fortunately there is no evidence of infection. No fevers, chills, nausea, or vomiting noted at this time. Overall states that is having more itching than pain which is actually a good sign in my opinion. 12/13/2019 upon evaluation today patient appears to be doing well today with regard to his wound. He has been tolerating the dressing changes without complication. Fortunately there is no sign of active infection at this time. No fevers, chills, nausea, vomiting, or diarrhea. Overall I feel like the infusion therapy has been very beneficial for him. 01/06/2020 on evaluation today patient appears to be doing well with regard to his wound. This is measuring smaller and actually looks to be doing better. Fortunately there is no signs of active infection at this point. No fevers, chills, nausea, vomiting, or diarrhea. With that being said he does still have the blue-green drainage but this does not seem to be causing any significant issues currently. He has been using the gentamicin that does seem to be keeping things under decent control at this point. He goes later this morning for his next infusion therapy for the pyoderma which seems to also be very beneficial. 02/07/2020 on evaluation today patient appears to be doing about the same in regard to his wounds currently. Fortunately there is no signs of active infection  systemically he does still have evidence of local infection still using gentamicin. He also is showing some signs of improvement albeit slowly I do feel like we are making some progress here. 02/21/2020 upon evaluation today patient appears to be making some signs of improvement the wound is measuring a little bit smaller which is great news and overall I am very pleased with where he stands currently. He is going to be having infusion therapy treatment on the 15th of this month. Fortunately there is no signs of active infection at this time. 03/13/2020 I do believe patient's wound is actually showing some signs of improvement here which is great news. He has continue with the infusion therapy through rheumatology/dermatology at [facility]. That does seem to be beneficial. I still think he gets as much benefit from this as he did from the prednisone initially but nonetheless obviously this is less harsh on his body that the prednisone as far as they are concerned. 03/31/2020 on evaluation today patient's wound actually showing signs of some pretty good improvement in regard  to the overall appearance of the wound bed. There is still muscle exposed though he does have some epithelial growth around the edges of the wound. Fortunately there is no signs of active infection at this time. No fevers, chills, nausea, vomiting, or diarrhea. 04/24/2020 upon evaluation today patient appears to be doing about the same in regard to his leg ulcer. He has been tolerating the dressing changes without complication. Fortunately there is no signs of active infection at this time. No fevers, chills, nausea, vomiting, or diarrhea. With that being said he still has a lot of irritation from the bandaging around the edges of the wound. We did discuss today the possibility of a referral to plastic surgery. 05/22/2020 on evaluation today patient appears to be doing well with regard to his wounds all things considered. He has not been able  to get the Chantix apparently there is a recall nurse that I was unaware of put out by [employer] involuntarily. Nonetheless for now I am and I have to do some research into what may be the best option for him to help with quitting in regard to smoking and we discussed that today. 06/26/2020 upon evaluation today patient appears to be doing well with regard to his wound from the standpoint of infection I do not see any signs of infection at this point. With that being said unfortunately he is still continuing to have issues with muscle exposure and again he is not having a whole lot of new skin growth unfortunately. There does not appear to be any signs of active infection at this time. No fevers, chills, nausea, vomiting, or diarrhea. 07/10/2020 upon evaluation today patient appears to be doing a little bit more poorly currently compared to where he was previous. I am concerned currently about an active infection that may be getting worse especially in light of the increased size and tenderness of the wound bed. No fevers, chills, nausea, vomiting, or diarrhea. 07/24/2020 upon evaluation today patient appears to be doing poorly in regard to his leg ulcer. He has been tolerating the dressing changes without complication but unfortunately is having a lot of discomfort. 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 Subsequently I think he is going require possibly IV antibiotics to get this under control. I am very concerned about the severity of his infection and the amount of discomfort he is having. 07/31/2020 upon evaluation today patient appears to be doing about the same in regard to his leg wound. He did see Dr. Megan Salon and Dr. Megan Salon is actually going to start him on IV antibiotics. He goes for the PICC line tomorrow. With that being said there do not have that run for 2 weeks and then see how things are doing and depending on how he  is progressing they may extend that a little longer. Nonetheless I am glad this is getting ready to be in place and definitely feel it may help the patient. In the meantime is been using mainly triamcinolone to the wound bed has an anti-inflammatory. 08/07/2020 on evaluation today patient appears to be doing well with regard to his wound compared even last week. In the interim he has gotten the PICC line placed and overall this seems to be doing excellent. There does not appear to be any evidence of infection which is great news systemically although locally of course has had the infection this appears to be improving with the use of the antibiotics. 08/14/2020 upon  evaluation today patient's wound actually showing signs of excellent improvement. Overall the irritation has significantly improved the drainage is back down to more of a normal level and his pain is really pretty much nonexistent compared to what it was. Obviously I think that this is significantly improved secondary to the IV antibiotic therapy which has made all the difference in the world. Again he had a resistant form of Pseudomonas for which oral antibiotics just was not cutting it. Nonetheless I do think that still we need to consider the possibility of a surgical closure for this wound is been open so long and to be honest with muscle exposed I think this can be very hard to get this to close outside of this although definitely were still working to try to do what we can in that regard. 08/21/2020 upon evaluation today patient appears to be doing very well with regard to his wounds on the left lateral lower extremity/calf area. Fortunately there does not appear to be signs of active infection which is great news and overall very pleased with where things stand today. He is actually wrapping up his treatment with IV antibiotics tomorrow. After that we will see where things go from there. 08/28/2020 upon evaluation today patient appears  to be doing decently well with regard to his leg ulcer. There does not appear to be any signs of active infection which is great news and overall very pleased with where things stand today. No fevers, chills, nausea, vomiting, or diarrhea. 09/18/2020 upon evaluation today patient appears to be doing well with regard to his infection which I feel like is better. Unfortunately he is not doing as well with regard to the overall size of the wound which is not nearly as good at this point. I feel like that he may be having an issue here Biebel, Duriel ([id number]) with the pyoderma being somewhat out of control. I think that he may benefit from potentially going back and talking to the dermatologist about what to do from the pyoderma standpoint. I am not certain if the infusions are helping nearly as much is what the prednisone did in the past. Electronic Signature(s) Signed: 09/18/2020 9:12:15 AM By: Worthy Keeler PA-C Entered By: Worthy Keeler on 09/18/2020 09:12:14 NAYIB, REMER ([id number]) -------------------------------------------------------------------------------- Physical Exam Details Patient Name: [name] Date of Service: 09/18/2020 8:15 AM Medical Record Number: [id number] Patient Account Number: [id number] Date of Birth/Sex: [birth year] (39 y.o. M) Treating RN: Donnamarie Poag Primary Care Provider: [name] Other Clinician: Referring Provider: [name] Treating Provider/Extender: [name] Cliche in Treatment: 21 Constitutional Well-nourished and well-hydrated in no acute distress. Respiratory normal breathing without difficulty. Psychiatric this patient is able to make decisions and demonstrates good insight into disease process. Alert and Oriented x 3. pleasant and cooperative. Notes Upon inspection today patient does not appear to have any obvious signs of infection at this point although it does look like a couple of the edges are somewhat leading outward and  expanding causing some of the issues here as far as the discomfort is concerned he is a little bit more uncomfortable with the pain compared to previous. Fortunately there does not appear to be any signs of active infection at this time which is great news. No fevers, chills, nausea, vomiting, or diarrhea. Electronic Signature(s) Signed: 09/18/2020 9:12:43 AM By: Worthy Keeler PA-C Entered By: Worthy Keeler on 09/18/2020 09:12:43 JASTEN, GUYETTE ([id number]) -------------------------------------------------------------------------------- Physician Orders Details Patient Name: [name] Date of Service: 09/18/2020  8:15 AM Medical Record Number: [id number] Patient Account Number: [id number] Date of Birth/Sex: 1980/08/25 (40 y.o. M) Treating RN: Donnamarie Poag Primary Care Provider: [name] Other Clinician: Referring Provider: [name] Treating Provider/Extender: [name] Cliche in Treatment: [phone] Verbal / Phone Orders: No Diagnosis Coding ICD-10 Coding Code Description [phone] Non-pressure chronic ulcer of left calf with fat layer exposed L88 Pyoderma gangrenosum L97.321 Non-pressure chronic ulcer of left ankle limited to breakdown of skin I87.2 Venous insufficiency (chronic) (peripheral) L03.116 Cellulitis of left lower limb F17.208 Nicotine dependence, unspecified, with other nicotine-induced disorders Follow-up Appointments o Return Appointment in 2 weeks. Edema Control - Lymphedema / Segmental Compressive Device / Other o Tubigrip double layer applied - F Additional Orders / Instructions o Other: - Prednisone 67m PO daily with food in AM x 1 week ordered Wound Treatment Wound #1 - Lower Leg Wound Laterality: Left, Lateral Cleanser: Soap and Water 1 x Per Day/30 Days Discharge Instructions: Gently cleanse wound with antibacterial soap, rinse and pat dry prior to dressing wounds Topical: Triamcinolone Acetonide Cream, 0.1%, 15 (g) tube 1 x Per Day/30  Days Discharge Instructions: to wound bed , lightly Secondary Dressing: Mepilex Border Flex, 6x6 (in/in) 1 x Per Day/30 Days Discharge Instructions: Apply to wound as directed. Do not cut. Add-Ons: tubi grip F (Generic) 1 x Per Day/30 Days Discharge Instructions: apply double layer to lower leg Patient Medications Allergies: milk, Biaxin, seasonal Notifications Medication Indication Start End prednisone 09/18/2020 DOSE 1 - oral 50 mg tablet - 1 tablet oral taken 1 time per day for 7 days. Do not take ibuprofen or naproxen with this medicaiton Electronic Signature(s) Signed: 09/18/2020 9:15:26 AM By: SWorthy KeelerPA-C Entered By: SWorthy Keeleron 09/18/2020 09:15:25 LROHITH, FAUTH([id number] -------------------------------------------------------------------------------- Problem List Details Patient Name: LHaynes KernsDate of Service: 09/18/2020 8:15 AM Medical Record Number: [id number] Account Number: [id number] of Birth/Sex: [birth year](39 y.o. M) Treating RN: ECarlene CoriaPrimary Care Provider: CAlma FriendlyOther Clinician: Referring Provider: CAlma FriendlyTreating Provider/Extender: SSkipper Clichein Treatment: 197 Active Problems ICD-10 Encounter Code Description Active Date MDM Diagnosis L97.222 Non-pressure chronic ulcer of left calf with fat layer exposed 12/04/2016 No Yes L88 Pyoderma gangrenosum 03/26/2017 No Yes L97.321 Non-pressure chronic ulcer of left ankle limited to breakdown of skin 10/08/2019 No Yes I87.2 Venous insufficiency (chronic) (peripheral) 12/04/2016 No Yes L03.116 Cellulitis of left lower limb 05/24/2019 No Yes F17.208 Nicotine dependence, unspecified, with other nicotine-induced disorders 04/24/2020 No Yes Inactive Problems ICD-10 Code Description Active Date Inactive Date L97.213 Non-pressure chronic ulcer of right calf with necrosis of muscle 04/02/2017 04/02/2017 Resolved Problems Electronic Signature(s) Signed: 09/18/2020 8:40:04  AM By: SWorthy KeelerPA-C Entered By: SWorthy Keeleron 09/18/2020 08:40:04 [place], RHerbie Baltimore([id number] -------------------------------------------------------------------------------- Progress Note Details Patient Name: LHaynes KernsDate of Service: 09/18/2020 8:15 AM Medical Record Number: [id number] Account Number: [id number] of Birth/Sex: 9June 29, 1982(39 y.o. M) Treating RN: BDonnamarie PoagPrimary Care Provider: CAlma FriendlyOther Clinician: Referring Provider: CAlma FriendlyTreating Provider/Extender: SSkipper Clichein Treatment: 197 Subjective Chief Complaint Information obtained from Patient He is here in follow up evaluation for LLE pyoderma ulcer History of Present Illness (HPI) 12/04/16; 40year old man who comes into the clinic today for review of a wound on the posterior left calf. He tells me that is been there for about a year. He is not a diabetic he does smoke half a pack per day. He was seen in the ER on 11/20/16 felt to  have cellulitis around the wound and was given clindamycin. An x-ray did not show osteomyelitis. The patient initially tells me that he has a milk allergy that sets off a pruritic itching rash on his lower legs which she scratches incessantly and he thinks that's what may have set up the wound. He has been using various topical antibiotics and ointments without any effect. He works in a [employer]ing [employer] and is on his feet all day. He does not have a prior history of wounds however he does have the rash on both lower legs the right arm and the ventral aspect of his left arm. These are excoriations and clearly have had scratching however there are of macular looking areas on both legs including a substantial larger area on the right leg. This does not have an underlying open area. There is no blistering. The patient tells me that 2 years ago in [place] in response to the rash on his legs he saw a dermatologist who told him he had a condition  which may be pyoderma gangrenosum although I may be putting words into his mouth. He seemed to recognize this. 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 He is not in any treatment for this. He's had no recent travel 12/11/16; the patient arrives today with his wound and roughly the same condition we've been using silver alginate this is a deep punched out wound with some surrounding erythema but no tenderness. Biopsy I did did not show confirmed pyoderma gangrenosum suggested nonspecific inflammation and vasculitis but does not provide an actual description of what was seen by the pathologist. I'm really not able to understand this We have also received information from the patient's dermatologist in [place] notes from April 2016. This was a doctor Agarwal-antal. 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 He was prescribed topical steroid high potency under occlusion which helped but at this point the patient did not have a deep punched out wound. 12/18/16; the patient's wound is larger in terms of surface area however this surface looks better and there is less depth. The surrounding erythema also is better. The patient states that the wrap we put on came off 2 days ago when he has been using his compression stockings. He we are in the process of getting a dermatology consult. 12/26/16 on evaluation today patient's left lower extremity wound shows evidence of infection with surrounding erythema noted. He has been tolerating the dressing changes but states that he has noted more discomfort. There is a larger area of erythema surrounding the wound. No fevers, chills, nausea, or vomiting noted at this time. With that being said the wound still does have slough covering the surface. He is not allergic to any medication that he is aware of at this point. In regard to his right lower extremity he had several regions that are erythematous and pruritic he  wonders if there's anything we can do to help that. 01/02/17 I reviewed patient's wound culture which was obtained his visit last week. He was placed on doxycycline at that point. Unfortunately that does not appear to be an antibiotic that would likely help with the situation however the pseudomonas noted on culture is sensitive to Cipro. Also unfortunately patient's wound seems to have a large compared to last week's evaluation. Not severely so but there are definitely increased measurements in general. He is continuing to have discomfort as well he writes this to be a seven out of  10. In fact he would prefer me not to perform any debridement today due to the fact that he is having discomfort and considering he has an active infection on the little reluctant to do so anyway. No fevers, chills, nausea, or vomiting noted at this time. 01/08/17; patient seems dermatology on September 5. I suspect dermatology will want the slides from the biopsy I did sent to their pathologist. I'm not sure if there is a way we can expedite that. In any case the culture I did before I left on vacation 3 weeks ago showed Pseudomonas he was given 10 days of Cipro and per her description of her intake nurses is actually somewhat better this week although the wound is quite a bit bigger than I remember the last time I saw this. He still has 3 more days of Cipro 01/21/17; dermatology appointment tomorrow. He has completed the ciprofloxacin for Pseudomonas. Surface of the wound looks better however he is had some deterioration in the lesions on his right leg. Meantime the left lateral leg wound we will continue with sample 01/29/17; patient had his dermatology appointment but I can't yet see that note. He is completed his antibiotics. The wound is more superficial but considerably larger in circumferential area than when he came in. This is in his left lateral calf. He also has swollen erythematous areas with superficial wounds on  the right leg and small papular areas on both arms. There apparently areas in her his upper thighs and buttocks I did not look at those. Dermatology biopsied the right leg. Hopefully will have their input next week. 02/05/17; patient went back to see his dermatologist who told him that he had a "scratching problem" as well as staph. He is now on a 30 day course of doxycycline and I believe she gave him triamcinolone cream to the right leg areas to help with the itching [not exactly sure but probably triamcinolone]. She apparently looked at the left lateral leg wound although this was not rebiopsied and I think felt to be ultimately part of the same pathogenesis. He is using sample border foam and changing nevus himself. He now has a new open area on the right posterior leg which was his biopsy site I don't have any of the dermatology notes 02/12/17; we put the patient in compression last week with SANTYL to the wound on the left leg and the biopsy. Edema is much better and the depth of the wound is now at level of skin. Area is still the same Biopsy site on the right lateral leg we've also been using santyl with a border foam dressing and he is changing this himself. 02/19/17; Using silver alginate started last week to both the substantial left leg wound and the biopsy site on the right wound. He is tolerating compression well. Has a an appointment with his primary M.D. tomorrow wondering about diuretics although I'm wondering if the edema problem is actually lymphedema [name], [name] ([id number]) 02/26/17; the patient has been to see his primary doctor Dr. Jerrel Ivory at [facility] our primary care. She started him on Lasix 20 mg and this seems to have helped with the edema. However we are not making substantial change with the left lateral calf wound and inflammation. The biopsy site on the right leg also looks stable but not really all that different. 03/12/17; the patient has been to see vein and  vascular Dr. Lucky Cowboy. He has had venous reflux studies I have not reviewed these. I did  get a call from his dermatology office. 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 They therefore recommended clobetasol and tetracycline i.e. 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 03/26/17; apparently the patient just had reflux studies not an appointment with Dr. dew. She arrives in clinic today having applied clobetasol for 2-3 weeks. He notes over the last 2-3 days excessive drainage having to change the dressing 3-4 times a day and also expanding erythema. 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 Lillia Carmel at [facility] dermatology locally who kindly saw him at my request. 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 My concern was the degree of erythema medially extending from his left leg wound which was either inflammation from pyoderma or cellulitis. I put him on Augmentin however culture of the wound showed Pseudomonas which is quinolone sensitive. I really don't believe he has cellulitis however in view of everything I will continue and give him a course of Cipro. He is also on doxycycline as an immune modulator for the pyoderma. In addition to his original wound on the left lateral leg with surrounding erythema he has a wound on the right posterior calf which was an original biopsy site done by dermatology. This was  felt to represent pathergy from pyoderma gangrenosum 04/16/17; pyoderma gangrenosum. Saw Dr. Lillia Carmel yesterday. He has been using topical antibiotics to both wound areas his original wound on the left and the biopsies/pathergy area on the right. There is definitely some improvement in the inflammation around the wound on the right although the patient states he has increasing sensitivity of the wounds. He is on prednisone 60 and doxycycline 1 as prescribed by Dr. Lillia Carmel. He is covering the topical antibiotic with gauze and putting this in his own compression stocks and changing this daily. He states that Dr. Lottie Rater did a culture of the left leg wound yesterday 05/07/17; pyoderma gangrenosum. The patient saw Dr. Lillia Carmel yesterday and has a follow-up with her in one month. He is still using topical antibiotics to both wounds although he can't recall exactly what type. He is still on prednisone 60 mg. Dr. Lillia Carmel stated that the doxycycline could stop if we were in agreement. 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 The right medial leg was induced by biopsy/pathergy. The area on the right is essentially healed. 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 The right medial leg has closed and remains closed. He is still on prednisone 60. He tells me he missed his last dermatology appointment with Dr. Lillia Carmel but will make another appointment. He reports that her blood sugar at a recent screen in [place] was high 200's. He was 180 today. 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 I think he is going to require  still much longer prednisone perhaps another 3 months before attempting to taper. In the meantime his wound is a lot better. Smaller. He is cleaning this off daily and applying  topical antibiotics. When he was last in the clinic I thought about changing to [facility] and actually put in a couple of calls to dermatology although probably not during their business hours. 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 He continues on oral prednisone. He has been using triple antibiotic ointment. There is surface debris and we will transition to [facility] and have him return in 2 weeks. He has lost 30 pounds since his last appointment with lifestyle modification. He may benefit from topical steroid cream for treatment this can be considered at a later date. 08/22/17 on evaluation today patient appears to actually be doing rather well in regard to his left lateral lower extremity ulcer. He has actually been managed by Dr. Dellia Nims most recently. Patient is currently on oral steroids at this time. This seems to have been of benefit for him. Nonetheless his last visit was actually with Leah on 08/06/17. Currently he is not utilizing any topical steroid creams although this could be of benefit as well. No fevers, chills, nausea, or vomiting noted at this time. 09/05/17 on evaluation today patient appears to be doing better in regard to his left lateral lower extremity ulcer. He has been tolerating the dressing changes without complication. He is using Santyl with good effect. Overall I'm very pleased with how things are standing at this point. Patient likewise is happy that this is doing better. 09/19/17 on evaluation today patient actually appears to be doing rather well in regard to his left lateral lower extremity ulcer. 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 He's not having any significant pain. 10/03/17 on evaluation today patient appears to be doing excellent in regard to his lower  extremity wound on the left secondary to Pyoderma gangrenosum. He has been tolerating the Santyl without complication and in general I feel like he's making good progress. 10/17/17 on evaluation today patient appears to be doing very well in regard to his left lateral lower surety ulcer. He has been tolerating the dressing changes without complication. There does not appear to be any evidence of infection he's alternating the Santyl and the triple antibiotic ointment every other day this seems to be doing well for him. 11/03/17 on evaluation today patient appears to be doing very well in regard to his left lateral lower extremity ulcer. He is been tolerating the dressing changes without complication which is good news. Fortunately there does not appear to be any evidence of infection which is also great news. Overall is doing excellent they are starting to taper down on the prednisone is down to 40 mg at this point it also started topical clobetasol for him. 11/17/17 on evaluation today patient appears to be doing well in regard to his left lateral lower surety ulcer. He's been tolerating the dressing changes without complication. He does note that he is having no pain, no excessive drainage or discharge, and overall he feels like things are going about how he would expect and hope they would. Overall he seems to have no evidence of infection at this time in my opinion which is good news.  12/04/17-He is seen in follow-up evaluation for right lateral lower extremity ulcer. He has been applying topical steroid cream. Today's measurement show slight increase in size. Over the next 2 weeks we will transition to every other day Santyl and steroid cream. He has been encouraged to monitor for changes and notify clinic with any concerns 12/15/17 on evaluation today patient's left lateral motion the ulcer and fortunately is doing worse again at this point. This just since last week to this week has close to  doubled in size according to the patient. I did not seeing last week's I do not have a visual to compare this to in our system was also down so we do not have all the charts and at this point. Nonetheless it does have me somewhat concerned in regard to the fact that again he was worried enough about it he has contact the dermatology that placed them back on the full strength, 50 mg a day of the prednisone that he was taken previous. He continues to alternate using clobetasol along with Santyl at this point. He is obviously somewhat frustrated. ERAN, MISTRY ([id number]) 12/22/17 on evaluation today patient appears to be doing a little worse compared to last evaluation. Unfortunately the wound is a little deeper and slightly larger than the last week's evaluation. With that being said he has made some progress in regard to the irritation surrounding at this time unfortunately despite that progress that's been made he still has a significant issue going on here. 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 For that reason I discussed with him today the possibility of perform a wound culture to ensure there's nothing overtly infected. 01/06/18 on evaluation today patient's wound is larger and deeper than previously evaluated. With that being said it did appear that his wound was infected after my last evaluation with him. Subsequently I did end up prescribing a prescription for Bactrim DS which she has been taking and having no complication with. Fortunately there does not appear to be any evidence of infection at this point in time as far as anything spreading, no want to touch, and overall I feel like things are showing signs of improvement. 01/13/18 on evaluation today patient appears to be even a little larger and deeper than last time. There still muscle  exposed in the base of the wound. Nonetheless he does appear to be less erythematous I do believe inflammation is calming down also believe the infection looks like it's probably resolved at this time based on what I'm seeing. No fevers, chills, nausea, or vomiting noted at this time. 01/30/18 on evaluation today patient actually appears to visually look better for the most part. Unfortunately those visually this looks better he does seem to potentially have what may be an abscess in the muscle that has been noted in the central portion of the wound. This is the first time that I have noted what appears to be fluctuance in the central portion of the muscle. With that being said I'm somewhat more concerned about the fact that this might indicate an abscess formation at this location. I do believe that an ultrasound would be appropriate. This is likely something we need to try to do as soon as possible. He has been switch to mupirocin ointment and he is no longer using the steroid ointment as prescribed by dermatology he sees  them again next week he's been decreased from 60 to 40 mg of prednisone. 03/09/18 on evaluation today patient actually appears to be doing a little better compared to last time I saw him. There's not as much erythema surrounding the wound itself. He I did review his most recent infectious disease note which was dated 02/24/18. He saw Dr. Michel Bickers in [place]. 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 Patient is now off of antibiotics and they are continually observing this. There seems to be no change in the past two weeks in my pinion based on what the patient says and what I see today compared to what Dr. Megan Salon likely saw two weeks ago. No fevers, chills, nausea, or vomiting noted at this time. 03/23/18 on evaluation today patient's wound actually appears to be showing signs of  improvement which is good news. He is currently still on the Dapsone. He is also working on tapering the prednisone to get off of this and Dr. Lottie Rater is working with him in this regard. Nonetheless overall I feel like the wound is doing well it does appear based on the infectious disease note that I reviewed from Dr. 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 I did also review the results of his ultrasound of left lower extremity which revealed there was a dentist tissue in the base of the wound without an abscess noted. 04/06/18 on evaluation today the patient's left lateral lower extremity ulcer actually appears to be doing fairly well which is excellent news. There does not appear to be any evidence of infection at this time which is also great news. Overall he still does have a significantly large ulceration although little by little he seems to be making progress. He is down to 10 mg a day of the prednisone. 04/20/18 on evaluation today patient actually appears to be doing excellent at this time in regard to his left lower extremity ulcer. He's making signs of good progress unfortunately this is taking much longer than we would really like to see but nonetheless he is making progress. Fortunately there does not appear to be any evidence of infection at this time. No fevers, chills, nausea, or vomiting noted at this time. The patient has not been using the Santyl due to the cost he hadn't got in this field yet. He's mainly been using the antibiotic ointment topically. Subsequently he also tells me that he really has not been scrubbing in the shower I think this would be helpful again as I told him it doesn't have to be anything too aggressive to even make it believe just enough to keep it free of some of the loose slough and biofilm on the wound surface. 05/11/18 on evaluation today  patient's wound appears to be making slow but sure progress in regard to the left lateral lower extremity ulcer. He is been tolerating the dressing changes without complication. Fortunately there does not appear to be any evidence of infection at this time. He is still just using triple antibiotic ointment along with clobetasol occasionally over the area. He never got the Santyl and really does not seem to intend to in my pinion. 06/01/18 on evaluation today patient appears to be doing a little better in regard to his left lateral lower extremity  ulcer. He states that overall he does not feel like he is doing as well with the Dapsone as he did with the prednisone. Nonetheless he sees his dermatologist later today and is gonna talk to them about the possibility of going back on the prednisone. Overall again I believe that the wound would be better if you would utilize Santyl but he really does not seem to be interested in going back to the [place] at this point. He has been using triple antibiotic ointment. 06/15/18 on evaluation today patient's wound actually appears to be doing about the same at this point. Fortunately there is no signs of infection at this time. He has made slight improvements although he continues to not really want to clean the wound bed at this point. He states that he just doesn't mess with it he doesn't want to cause any problems with everything else he has going on. He has been on medication, antibiotics as prescribed by his dermatologist, for a staff infection of his lower extremities which is really drying out now and looking much better he tells me. Fortunately there is no sign of overall infection. 06/29/18 on evaluation today patient appears to be doing well in regard to his left lateral lower surety ulcer all things considering. Fortunately his staff infection seems to be greatly improved compared to previous. He has no signs of infection and this is drying up quite nicely.  He is still the doxycycline for this is no longer on cental, Dapsone, or any of the other medications. His dermatologist has recommended possibility of an infusion but right now he does not want to proceed with that. 07/13/18 on evaluation today patient appears to be doing about the same in regard to his left lateral lower surety ulcer. Fortunately there's no signs of infection at this time which is great news. Unfortunately he still builds up a significant amount of Slough/biofilm of the surface of the wound he still is not really cleaning this as he should be appropriately. Again I'm able to easily with saline and gauze remove the majority of this on the surface which if you would do this at home would likely be a dramatic improvement for him as far as getting the area to improve. Nonetheless overall I still feel like he is making progress is just very slow. I think Santyl will be of benefit for him as well. Still he has not gotten this as of this point. 07/27/18 on evaluation today patient actually appears to be doing little worse in regards of the erythema around the periwound region of the wound he also tells me that he's been having more drainage currently compared to what he was experiencing last time I saw him. He states not quite as bad as what he had because this was infected previously but nonetheless is still appears to be doing poorly. Fortunately there is no evidence of systemic infection at this point. The patient tells me that he is not going to be able to afford the Santyl. He is still waiting to hear about the infusion therapy with his dermatologist. Apparently she wants an updated colonoscopy first. VIOLET, CART ([id number]) 08/10/18 on evaluation today patient appears to be doing better in regard to his left lateral lower extremity ulcer. Fortunately he is showing signs of improvement in this regard he's actually been approved for Remicade infusion's as well although this has not been  scheduled as of yet. Fortunately there's no signs of active infection at this time in regard  to the wound although he is having some issues with infection of the right lower extremity is been seen as dermatologist for this. Fortunately they are definitely still working with him trying to keep things under control. 09/07/18 on evaluation today patient is actually doing rather well in regard to his left lateral lower extremity ulcer. He notes these actually having some hair grow back on his extremity which is something he has not seen in years. He also tells me that the pain is really not giving them any trouble at this time which is also good news overall she is very pleased with the progress he's using a combination of the mupirocin along with the probate is all mixed. 09/21/18 on evaluation today patient actually appears to be doing fairly well all things considered in regard to his looks from the ulcer. He's been tolerating the dressing changes without complication. Fortunately there's no signs of active infection at this time which is good news he is still on all antibiotics or prevention of the staff infection. He has been on prednisone for time although he states it is gonna contact his dermatologist and see if she put them on a short course due to some irritation that he has going on currently. Fortunately there's no evidence of any overall worsening this is going very slow I think cental would be something that would be helpful for him although he states that $50 for tube is quite expensive. He therefore is not willing to get that at this point. 10/06/18 on evaluation today patient actually appears to be doing decently well in regard to his left lateral leg ulcer. He's been tolerating the dressing changes without complication. Fortunately there's no signs of active infection at this time. Overall I'm actually rather pleased with the progress he's making although it's slow he doesn't show any signs  of infection and he does seem to be making some improvement. I do believe that he may need a switch up and dressings to try to help this to heal more appropriately and quickly. 10/19/18 on evaluation today patient actually appears to be doing better in regard to his left lateral lower extremity ulcer. This is shown signs of having much less Slough buildup at this point due to the fact he has been using the [employer]. Obviously this is very good news. The overall size of the wound is not dramatically smaller but again the appearance is. 11/02/18 on evaluation today patient actually appears to be doing quite well in regard to his lower [place] ulcer. A lot of the skin around the ulcer is actually somewhat irritating at this point this seems to be more due to the dressing causing irritation from the adhesive that anything else. Fortunately there is no signs of active infection at this time. 11/24/18 on evaluation today patient appears to be doing a little worse in regard to his overall appearance of his lower extremity ulcer. There's more erythema and warmth around the wound unfortunately. He is currently on doxycycline which he has been on for some time. With that being said I'm not sure that seems to be helping with what appears to possibly be an acute cellulitis with regard to his left lower extremity ulcer. No fevers, chills, nausea, or vomiting noted at this time. 12/08/18 on evaluation today patient's wounds actually appears to be doing significantly better compared to his last evaluation. He has been using Santyl along with alternating tripling about appointment as well as the steroid cream seems to be doing  quite well and the wound is showing signs of improvement which is excellent news. Fortunately there's no evidence of infection and in fact his culture came back negative with only normal skin flora noted. 12/21/2018 upon evaluation today patient actually appears to be doing excellent with regard to his  ulcer. This is actually the best that I have seen it since have been helping to take care of him. It is both smaller as well as less slough noted on the surface of the wound and seems to be showing signs of good improvement with new skin growing from the edges. He has been using just the triamcinolone he does wonder if he can get a refill of that ointment today. 01/04/2019 upon evaluation today patient actually appears to be doing well with regard to his left lateral lower extremity ulcer. With that being said it does not appear to be that he is doing quite as well as last time as far as progression is concerned. There does not appear to be any signs of infection or significant irritation which is good news. With that being said I do believe that he may benefit from switching to a collagen based dressing based on how clean The wound appears. 01/18/2019 on evaluation today patient actually appears to be doing well with regard to his wound on the left lower extremity. He is not made a lot of progress compared to where we were previous but nonetheless does seem to be doing okay at this time which is good news. There is no signs of active infection which is also good news. My only concern currently is I do wish we can get him into utilizing the collagen dressing his insurance would not pay for the supplies that we ordered although it appears that he may be able to order this through his supply company that he typically utilizes. This is [employer]. Nonetheless he did try to order it during the office visit today and it appears this did go through. We will see if he can get that it is a different brand but nonetheless he has collagen and I do think will be beneficial. 02/01/2019 on evaluation today patient actually appears to be doing a little worse today in regard to the overall size of his wounds. Fortunately there is no signs of active infection at this time. That is visually. Nonetheless when this is  happened before it was due to infection. For that reason were somewhat concerned about that this time as well. 02/08/2019 on evaluation today patient unfortunately appears to be doing slightly worse with regard to his wound upon evaluation today. Is measuring a little deeper and a little larger unfortunately. I am not really sure exactly what is causing this to enlarge he actually did see his dermatologist she is going to see about initiating Humira for him. Subsequently she also did do steroid injections into the wound itself in the periphery. Nonetheless still nonetheless he seems to be getting a little bit larger he is gone back to just using the steroid cream topically which I think is appropriate. I would say hold off on the collagen for the time being is definitely a good thing to do. Based on the culture results which we finally did get the final result back regarding it shows staph as the bacteria noted again that can be a normal skin bacteria based on the fact however he is having increased drainage and worsening of the wound measurement wise I would go ahead and place him  on an antibiotic today I do believe for this. 02/15/2019 on evaluation today patient actually appears to be doing somewhat better in regard to his ulcer. There is no signs of worsening at this time I did review his culture results which showed evidence of Staphylococcus aureus but not MRSA. Again this could just be more related to the normal skin bacteria although he states the drainage has slowed down quite a bit he may have had a mild infection not just colonization. And was much smaller and then since around10/04/2019 on evaluation today patient appears to be doing unfortunately worse as far as the size of the wound. I really feel like that this is steadily getting larger again it had been doing excellent right at the beginning of September we have seen a steady increase in the area of the wound it is almost 2-1/2 times  the size it was on September 1. Obviously this is a bad trend this is not wanting to see. For that reason we went back to using just the topical triamcinolone cream which does seem to help with inflammation. I checked him for bacteria by way of culture and nothing showed positive there. I am considering giving him a short course of a tapering steroid Acen Craun, Taten ([id number]) today to see if that is can be beneficial for him. The patient is in agreement with giving that a try. 03/08/2019 on evaluation today patient appears to be doing very well in comparison to last evaluation with regard to his lower extremity ulcer. This is showing signs of less inflammation and actually measuring slightly smaller compared to last time every other week over the past month and a half he has been measuring larger larger larger. Nonetheless I do believe that the issue has been inflammation the prednisone does seem to have been beneficial for him which is good news. No fevers, chills, nausea, vomiting, or diarrhea. 03/22/2019 on evaluation today patient appears to be doing about the same with regard to his leg ulcer. He has been tolerating the dressing changes without complication. With that being said the wound seems to be mostly arrested at its current size but really is not making any progress except for when we prescribed the prednisone. He did show some signs of dropping as far as the overall size of the wound during that interval week. Nonetheless this is something he is not on long-term at this point and unfortunately I think he is getting need either this or else the Humira which his dermatologist has discussed try to get approval for. With that being said he will be seeing his dermatologist on the 11th of this month that is November. 04/19/2019 on evaluation today patient appears to be doing really about the same the wound is measuring slightly larger compared to last time I saw him. He has not been  into the office since November 2 due to the fact that he unfortunately had Covid as that his entire family. He tells me that it was rough but they did pull-through and he seems to be doing much better. Fortunately there is no signs of active infection at this time. No fevers, chills, nausea, vomiting, or diarrhea. 05/10/2019 on evaluation today patient unfortunately appears to be doing significantly worse as compared to last time I saw him. He does tell me that he has had his first dose of Humira and actually is scheduled to get the next one in the upcoming week. With that being said he tells me also that  in the past several days he has been having a lot of issues with green drainage she showed me a picture this is more blue-green in color. He is also been having issues with increased sloughy buildup and the wound does appear to be larger today. Obviously this is not the direction that we want everything to take based on the starting of his Humira. Nonetheless I think this is definitely a result of likely infection and to be honest I think this is probably Pseudomonas causing the infection based on what I am seeing. 05/24/2019 on evaluation today patient unfortunately appears to be doing significantly worse compared to his prior evaluation with me 2 weeks ago. I did review his culture results which showed that he does have Staph aureus as well as Pseudomonas noted on the culture. Nonetheless the Levaquin that I prescribed for him does not appear to have been appropriate and in fact he tells me he is no longer experiencing the green drainage and discharge that he had at the last visit. Fortunately there is no signs of active infection at this time which is good news although the wound has significantly worsened it in fact is much deeper than it was previous. We have been utilizing up to this point triamcinolone ointment as the prescription topical of choice but at this time I really feel like that the  wound is getting need to be packed in order to appropriately manage this due to the deeper nature of the wound. Therefore something along the lines of an alginate dressing may be more appropriate. 05/31/2019 upon inspection today patient's wound actually showed signs of doing poorly at this point. Unfortunately he just does not seem to be making any good progress despite what we have tried. He actually did go ahead and pick up the Cipro and start taking that as he was noticing more green drainage he had previously completed the Levaquin that I prescribed for him as well. Nonetheless he missed his appointment for the seventh last week on Wednesday with the wound care center and [facility] where his dermatologist referred him. Obviously I do think a second opinion would be helpful at this point especially in light of the fact that the patient seems to be doing so poorly despite the fact that we have tried everything that I really know how at this point. The only thing that ever seems to have helped him in the past is when he was on high doses of continual steroids that did seem to make a difference for him. 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 06/07/2019 upon evaluation today patient unfortunately appears to be doing worse yet again with regard to his wound. In fact I am starting to question whether or not he may have a fluid pocket in the muscle at this point based on the bulging and the soft appearance to the central portion of the muscle area. There is not anything draining from the muscle itself at this time which is good news but nonetheless the wound is expanding. I am not really seeing any results of the Humira as far as overall wound progression based on what I am seeing at this point. The patient has been referred for second  opinion with regard to his wound to the [facility] wound care center  by his dermatologist which I definitely am not in opposition to. Unfortunately we tried multiple dressings in the past including collagen, alginate, and at one point even Hydrofera Blue. With that being said he is never really used it for any significant amount of time due to the fact that he often complains of pain associated with these dressings and then will go back to either using the Santyl which she has done intermittently or more frequently the triamcinolone. He is also using his own compression stockings. We have wrapped him in the past but again that was something else that he really was not a big fan of. 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 He seemed to do much better when he was on the steroids although honestly I understand that the steroids are not necessarily the best medication to be on long-term obviously 06/14/2019 on evaluation today patient appears to be doing actually a little bit better with regard to the overall appearance with his leg. Unfortunately he does continue to have issues with what appears to be some fluid underneath the muscle although he did see the wound specialty center at [facility] last week their main goals were to see about infusion therapy in place of the Humira as they feel like that is not quite strong enough. They also recommended that we continue with the treatment otherwise as we are they felt like that was appropriate and they are okay with him continuing to follow-up here with [place] in that regard. With that being said they are also sending him to the vein specialist there to  see about vein stripping and if that would be of benefit for him. Subsequently they also did not really address whether or not an ultrasound of the muscle area to see if there is anything that needs to be addressed here would be appropriate or not. For that reason I discussed this with him last week I think we may proceed down that road at this point. 06/21/2019 upon evaluation today patient's wound actually appears to be doing slightly better compared to previous evaluations. I do believe that he has made a difference with regard to the progression here with the use of oral steroids. Again in the past has been the only thing that is really calm things down. He does tell me that from [name] is gotten a good news from there that there are no further vein stripping that is necessary at this point. I do not have that available for review today although the patient did relay this to me. He also did obtain and have the ultrasound of the wound completed which I did sign off on today. It does appear that there is no fluid collection under the muscle this is likely then just edematous tissue in general. That is also good news. Overall I still believe the inflammation is the main issue here. He did inquire about the possibility of a wound VAC again with the muscle protruding like it is I am not really sure whether the wound VAC is necessarily ideal or not. That is something we will have to consider although I do believe he may need  compression wrapping to try to help with edema control which could potentially be of benefit. 06/28/2019 on evaluation today patient appears to be doing slightly better measurement wise although this is not terribly smaller he least seems to be trending towards that direction. With that being said he still seems to have purulent drainage noted in the wound bed at this time. He has been on Levaquin followed by Cipro over the past month. Unfortunately he still seems to have some issues with  active infection at this time. I did perform a culture last week in order to evaluate and see if indeed there was still anything going on. Subsequently the culture did come back Cooksey, Renee ([id number]) showing Pseudomonas which is consistent with the drainage has been having which is blue-green in color. He also has had an odor that again was somewhat consistent with Pseudomonas as well. Long story short it appears that the culture showed an intermediate finding with regard to how well the Cipro will work for the Pseudomonas infection. Subsequently being that he does not seem to be clearing up and at best what we are doing is just keeping this at [place] I think he may need to see infectious disease to discuss IV antibiotic options. 07/05/2019 upon evaluation today patient appears to be doing okay in regard to his leg ulcer. He has been tolerating the dressing changes at this point without complication. Fortunately there is no signs of active infection at this time which is good news. No fevers, chills, nausea, vomiting, or diarrhea. With that being said he does have an appointment with infectious disease tomorrow and his primary care on Wednesday. Again the reason for the infectious disease referral was due to the fact that he did not seem to be fully resolving with the use of oral antibiotics and therefore we were thinking that IV antibiotic therapy may be necessary secondary to the fact that there was an intermediate finding for how effective the Cipro may be. Nonetheless again he has been having a lot of purulent and even green drainage. Fortunately right now that seems to have calmed down over the past week with the reinitiation of the oral antibiotic. Nonetheless we will see what Dr. Megan Salon has to say. 07/12/2019 upon evaluation today patient appears to be doing about the same at this point in regard to his left lower extremity ulcer. Fortunately there is no signs of active infection at this time  which is good news I do believe the Levaquin has been beneficial I did review Dr. Hale Bogus note and to be honest I agree that the patient's leg does appear to be doing better currently. What we found in the past as he does not seem to really completely resolve he will stop the antibiotic and then subsequently things will revert back to having issues with blue-green drainage, increased pain, and overall worsening in general. Obviously that is the reason I sent him back to infectious disease. 07/19/2019 upon evaluation today patient appears to be doing roughly the same in size there is really no dramatic improvement. He has started back on the Levaquin at this point and though he seems to be doing okay he did still have a lot of blue/green drainage noted on evaluation today unfortunately. I think that this is still indicative more likely of a Pseudomonas infection as previously noted and again he does see Dr. Megan Salon in just a couple of days. I do not know that were really able to effectively clear this with just  oral antibiotics alone based on what I am seeing currently. Nonetheless we are still continue to try to manage as best we can with regard to the patient and his wound. I do think the wrap was helpful in decreasing the edema which is excellent news. No fevers, chills, nausea, vomiting, or diarrhea. 07/26/2019 upon evaluation today patient appears to be doing slightly better with regard to the overall appearance of the muscle there is no dark discoloration centrally. Fortunately there is no signs of active infection at this time. No fevers, chills, nausea, vomiting, or diarrhea. Patient's wound bed currently the patient did have an appointment with Dr. Megan Salon at infectious disease last week. With that being said Dr. Megan Salon the patient states was still somewhat hesitant about put him on any IV antibiotics he wanted [place] to repeat cultures today and then see where things go going forward. He does  look like Dr. Megan Salon because of some improvement the patient did have with the Levaquin wanted [place] to see about repeating cultures. If it indeed grows the Pseudomonas again then he recommended a possibility of considering a PICC line placement and IV antibiotic therapy. He plans to see the patient back in 1 to 2 weeks. 08/02/2019 upon evaluation today patient appears to be doing poorly with regard to his left lower extremity. We did get the results of his culture back it shows that he is still showing evidence of Pseudomonas which is consistent with the purulent/blue-green drainage that he has currently. Subsequently the culture also shows that he now is showing resistance to the oral fluoroquinolones which is unfortunate as that was really the only thing to treat the infection prior. I do believe that he is looking like this is going require IV antibiotic therapy to get this under control. Fortunately there is no signs of systemic infection at this time which is good news. The patient does see Dr. Megan Salon tomorrow. 08/09/2019 upon evaluation today patient appears to be doing better with regard to his left lower extremity ulcer in regard to the overall appearance. He is currently on IV antibiotic therapy. As ordered by Dr. Megan Salon. Currently the patient is on ceftazidime which she is going to take for the next 2 weeks and then follow-up for 4 to 5-week appointment with Dr. Megan Salon. The patient started this this past Friday symptoms have not for a total of 3 days currently in full. 08/16/2019 upon evaluation today patient's wound actually does show muscle in the base of the wound but in general does appear to be much better as far as the overall evidence of infection is concerned. In fact I feel like this is for the most part cleared up he still on the IV antibiotics he has not completed the full course yet but I think he is doing much better which is excellent news. 08/23/2019 upon evaluation today  patient appears to be doing about the same with regard to his wound at this point. He tells me that he still has pain unfortunately. Fortunately there is no evidence of systemic infection at this time which is great news. There is significant muscle protrusion. 09/13/19 upon evaluation today patient appears to be doing about the same in regard to his leg unfortunately. He still has a lot of drainage coming from the ulceration there is still muscle exposed. With that being said the patient's last wound culture still showed an intermediate finding with regard to the Pseudomonas he still having the bluish/green drainage as well. Overall I do not  know that the wound has completely cleared of infection at this point. Fortunately there is no signs of active infection systemically at this point which is good news. 09/20/2019 upon evaluation today patient's wound actually appears to be doing about the same based on what I am seeing currently. I do not see any signs of systemic infection he still does have evidence of some local infection and drainage. He did see Dr. Megan Salon last week and Dr. Megan Salon states that he probably does need a different IV antibiotic although he does not want to put him on this until the patient begins the Remicade infusion which is actually scheduled for about 10 days out from today on 13 May. Following that time Dr. Megan Salon is good to see him back and then will evaluate the feasibility of starting him on the IV antibiotic therapy once again at that point. I do not disagree with this plan I do believe as Dr. Megan Salon stated in his note that I reviewed today that the patient's issue is multifactorial with the pyoderma being 1 aspect of this that were hoping the Remicade will be helpful for her. In the meantime I think the gentamicin is, helping to keep things under decent okay control in regard to the ulcer. 09/27/2019 upon evaluation today patient appears to be doing about the same  with regard to his wound still there is a lot of muscle exposure though he does have some hyper granulation tissue noted around the edge and actually some granulation tissue starting to form over the muscle which is actually good news. Fortunately there is no evidence of active infection which is also good news. His pain is less at this point. 5/21; this is a patient I have not seen in a long time. 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 He has a large wound on the left lateral leg with protruding muscle. He comes in the clinic today showing the same area on his left medial ankle. He says there is been a spot there for some time although we have not previously defined this. Today he has a clearly defined area with slight amount of skin breakdown surrounded by raised areas with a purplish hue in color. This is not painful he says it is irritated. 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 He still has muscle protruding from the lateral portion of his left leg but fortunately the new area that they were concerned about at his last visit does not appear to have opened at this point. He is currently on Remicade infusions and seems to be doing better in my opinion in fact the wound itself seems to be overall much better. The purplish discoloration that he did have seems to have resolved and I think that is a good sign that hopefully the Remicade is doing its job. He does Vath, Brydan ([id number]) have some biofilm noted over the surface of the wound. 11/01/2019 on evaluation today patient's wound actually appears to be doing excellent at this time. Fortunately there is no evidence of active infection and overall I feel like he is making great progress. The Remicade seems to be due excellent job in my opinion. 11/08/19 evaluation today vision actually appears to  be doing quite well with regard to his weight ulcer. He's been tolerating dressing changes without complication. Fortunately there is no evidence of infection. No fevers, chills, nausea, or vomiting  noted at this time. Overall states that is having more itching than pain which is actually a good sign in my opinion. 12/13/2019 upon evaluation today patient appears to be doing well today with regard to his wound. He has been tolerating the dressing changes without complication. Fortunately there is no sign of active infection at this time. No fevers, chills, nausea, vomiting, or diarrhea. Overall I feel like the infusion therapy has been very beneficial for him. 01/06/2020 on evaluation today patient appears to be doing well with regard to his wound. This is measuring smaller and actually looks to be doing better. Fortunately there is no signs of active infection at this point. No fevers, chills, nausea, vomiting, or diarrhea. With that being said he does still have the blue-green drainage but this does not seem to be causing any significant issues currently. He has been using the gentamicin that does seem to be keeping things under decent control at this point. He goes later this morning for his next infusion therapy for the pyoderma which seems to also be very beneficial. 02/07/2020 on evaluation today patient appears to be doing about the same in regard to his wounds currently. Fortunately there is no signs of active infection systemically he does still have evidence of local infection still using gentamicin. He also is showing some signs of improvement albeit slowly I do feel like we are making some progress here. 02/21/2020 upon evaluation today patient appears to be making some signs of improvement the wound is measuring a little bit smaller which is great news and overall I am very pleased with where he stands currently. He is going to be having infusion therapy treatment on the 15th of this month.  Fortunately there is no signs of active infection at this time. 03/13/2020 I do believe patient's wound is actually showing some signs of improvement here which is great news. He has continue with the infusion therapy through rheumatology/dermatology at [facility]. That does seem to be beneficial. I still think he gets as much benefit from this as he did from the prednisone initially but nonetheless obviously this is less harsh on his body that the prednisone as far as they are concerned. 03/31/2020 on evaluation today patient's wound actually showing signs of some pretty good improvement in regard to the overall appearance of the wound bed. There is still muscle exposed though he does have some epithelial growth around the edges of the wound. Fortunately there is no signs of active infection at this time. No fevers, chills, nausea, vomiting, or diarrhea. 04/24/2020 upon evaluation today patient appears to be doing about the same in regard to his leg ulcer. He has been tolerating the dressing changes without complication. Fortunately there is no signs of active infection at this time. No fevers, chills, nausea, vomiting, or diarrhea. With that being said he still has a lot of irritation from the bandaging around the edges of the wound. We did discuss today the possibility of a referral to plastic surgery. 05/22/2020 on evaluation today patient appears to be doing well with regard to his wounds all things considered. He has not been able to get the Chantix apparently there is a recall nurse that I was unaware of put out by [employer] involuntarily. Nonetheless for now I am and I have to do some research into what may be the best option for him to help with quitting in regard to smoking and we discussed that today. 06/26/2020 upon evaluation today patient appears to be doing well  with regard to his wound from the standpoint of infection I do not see any signs of infection at this point. With that being said  unfortunately he is still continuing to have issues with muscle exposure and again he is not having a whole lot of new skin growth unfortunately. There does not appear to be any signs of active infection at this time. No fevers, chills, nausea, vomiting, or diarrhea. 07/10/2020 upon evaluation today patient appears to be doing a little bit more poorly currently compared to where he was previous. I am concerned currently about an active infection that may be getting worse especially in light of the increased size and tenderness of the wound bed. No fevers, chills, nausea, vomiting, or diarrhea. 07/24/2020 upon evaluation today patient appears to be doing poorly in regard to his leg ulcer. He has been tolerating the dressing changes without complication but unfortunately is having a lot of discomfort. 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 Subsequently I think he is going require possibly IV antibiotics to get this under control. I am very concerned about the severity of his infection and the amount of discomfort he is having. 07/31/2020 upon evaluation today patient appears to be doing about the same in regard to his leg wound. He did see Dr. Megan Salon and Dr. Megan Salon is actually going to start him on IV antibiotics. He goes for the PICC line tomorrow. With that being said there do not have that run for 2 weeks and then see how things are doing and depending on how he is progressing they may extend that a little longer. Nonetheless I am glad this is getting ready to be in place and definitely feel it may help the patient. In the meantime is been using mainly triamcinolone to the wound bed has an anti-inflammatory. 08/07/2020 on evaluation today patient appears to be doing well with regard to his wound compared even last week. In the interim he has gotten the PICC line placed and overall this seems to be doing excellent. There does  not appear to be any evidence of infection which is great news systemically although locally of course has had the infection this appears to be improving with the use of the antibiotics. 08/14/2020 upon evaluation today patient's wound actually showing signs of excellent improvement. Overall the irritation has significantly improved the drainage is back down to more of a normal level and his pain is really pretty much nonexistent compared to what it was. Obviously I think that this is significantly improved secondary to the IV antibiotic therapy which has made all the difference in the world. Again he had a resistant form of Pseudomonas for which oral antibiotics just was not cutting it. Nonetheless I do think that still we need to consider the possibility of a surgical closure for this wound is been open so long and to be honest with muscle exposed I think this can be very hard to get this to close outside of this although definitely were still working to try to do what we can in that regard. 08/21/2020 upon evaluation today patient appears to be doing very well with regard to his wounds on the left lateral lower extremity/calf area. Fortunately there does not appear to be signs of active infection which is great news and overall very pleased with where things stand today. He is actually wrapping up his treatment with IV antibiotics tomorrow. After that we will see where things go from  there. 08/28/2020 upon evaluation today patient appears to be doing decently well with regard to his leg ulcer. There does not appear to be any signs of Vesey, Demani ([id number]) active infection which is great news and overall very pleased with where things stand today. No fevers, chills, nausea, vomiting, or diarrhea. 09/18/2020 upon evaluation today patient appears to be doing well with regard to his infection which I feel like is better. Unfortunately he is not doing as well with regard to the overall size of the wound  which is not nearly as good at this point. I feel like that he may be having an issue here with the pyoderma being somewhat out of control. I think that he may benefit from potentially going back and talking to the dermatologist about what to do from the pyoderma standpoint. I am not certain if the infusions are helping nearly as much is what the prednisone did in the past. Objective Constitutional Well-nourished and well-hydrated in no acute distress. Vitals Time Taken: 8:34 AM, Height: 71 in, Weight: 338 lbs, BMI: 47.1, Temperature: 98.3 F, Pulse: 83 bpm, Respiratory Rate: 18 breaths/min, Blood Pressure: 152/79 mmHg. Respiratory normal breathing without difficulty. Psychiatric this patient is able to make decisions and demonstrates good insight into disease process. Alert and Oriented x 3. pleasant and cooperative. General Notes: Upon inspection today patient does not appear to have any obvious signs of infection at this point although it does look like a couple of the edges are somewhat leading outward and expanding causing some of the issues here as far as the discomfort is concerned he is a little bit more uncomfortable with the pain compared to previous. Fortunately there does not appear to be any signs of active infection at this time which is great news. No fevers, chills, nausea, vomiting, or diarrhea. Integumentary (Hair, Skin) Wound #1 status is Open. Original cause of wound was Gradually Appeared. The date acquired was: 11/18/2015. The wound has been in treatment 197 weeks. The wound is located on the Left,Lateral Lower Leg. The wound measures 6.5cm length x 7.5cm width x 1cm depth; 38.288cm^2 area and 38.288cm^3 volume. There is muscle and Fat Layer (Subcutaneous Tissue) exposed. There is no tunneling or undermining noted. There is a medium amount of serosanguineous drainage noted. The wound margin is epibole. There is medium (34-66%) red, pink granulation within the wound bed.  There is a medium (34-66%) amount of necrotic tissue within the wound bed including Adherent Slough. 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 Edema Control - Lymphedema / Segmental Compressive Device / Other: Tubigrip double layer applied - F Additional Orders / Instructions: Other: - Prednisone 78m PO daily with food in AM x 1 week ordered The following medication(s) was prescribed: prednisone oral 50 mg tablet 1 1 tablet oral taken 1 time per day for 7 days. Do not take ibuprofen or naproxen with this medicaiton starting 09/18/2020 WOUND #1: - Lower Leg Wound Laterality: Left, Lateral Dutch, Adom ([id number] Cleanser: Soap and Water 1 x Per Day/30 Days Discharge Instructions: Gently cleanse wound with antibacterial soap, rinse and pat dry prior to dressing wounds Topical: Triamcinolone Acetonide Cream, 0.1%, 15 (g) tube 1 x Per Day/30 Days Discharge Instructions: to wound bed , lightly Secondary Dressing:  Mepilex Border Flex, 6x6 (in/in) 1 x Per Day/30 Days Discharge Instructions: Apply to wound as directed. Do not cut. Add-Ons: tubi grip F (Generic) 1 x Per Day/30 Days Discharge Instructions: apply double layer to lower leg 1. Would recommend currently that we have the patient continue with the wound care measures as before. He is using a little bit of triamcinolone just topically and then subsequently a border foam dressing to cover. 2. I am also can recommend that the patient continue with the Tubigrip for the time being. With that being said he may benefit from a dual layer compression stocking which they do sell at [facility] for fairly cheap price of somewhere around $20. That could help with more sufficient compression as well. 3. I am also can recommend patient continue to monitor for any signs of worsening from an infection standpoint. Right now I do not see any evidence of this but at the same time things could easily change in that regard. We will get a keep a close eye on where he stands. 4. I am also can recommend that we go ahead and give him a short course of prednisone for the next week just to see if that helps with some of the inflammation and pain. If he improves with this that I think that would be an indication as well that this may be more pyoderma related as opposed to related to infection. We will see patient back for reevaluation in 1 week here in the clinic. If anything worsens or changes patient will contact our office for additional recommendations. Electronic Signature(s) Signed: 09/18/2020 9:15:33 AM By: Worthy Keeler PA-C Entered By: Worthy Keeler on 09/18/2020 09:15:33 Dani, [name] ([id number]) -------------------------------------------------------------------------------- SuperBill Details Patient Name: [name] Date of Service: 09/18/2020 Medical Record Number: [id number] Patient Account Number: [id number] Date of Birth/Sex: [birth year] (40 y.o. M) Treating RN: Donnamarie Poag Primary Care Provider: [name] Other Clinician: Referring Provider: [name] Treating Provider/Extender: [name] Cliche in Treatment: 197 Diagnosis Coding ICD-10 Codes Code Description [id number] Non-pressure chronic ulcer of left calf with fat layer exposed L88 Pyoderma gangrenosum L97.321 Non-pressure chronic ulcer of left ankle limited to breakdown of skin I87.2 Venous insufficiency (chronic) (peripheral) L03.116 Cellulitis of left lower limb F17.208 Nicotine dependence, unspecified, with other nicotine-induced disorders Facility Procedures CPT4 Code: 99692493 Description:  24199 - WOUND CARE VISIT-LEV 2 EST PT Modifier: Quantity: 1 Physician Procedures CPT4 Code: 1444584 Description: 83507 - WC PHYS LEVEL 4 - EST PT Modifier: Quantity: 1 CPT4 Code: Description: ICD-10 Diagnosis Description L97.222 Non-pressure chronic ulcer of left calf with fat layer exposed L88 Pyoderma gangrenosum L97.321 Non-pressure chronic ulcer of left ankle limited to breakdown of skin I87.2 Venous insufficiency (chronic)  (peripheral) Modifier: Quantity: Electronic Signature(s) Signed: 09/18/2020 9:15:49 AM By: Worthy Keeler PA-C Entered By: Worthy Keeler on 09/18/2020 09:15:49

## 2020-09-28 ENCOUNTER — Other Ambulatory Visit: Payer: Self-pay

## 2020-09-28 ENCOUNTER — Ambulatory Visit (INDEPENDENT_AMBULATORY_CARE_PROVIDER_SITE_OTHER): Payer: 59 | Admitting: Plastic Surgery

## 2020-09-28 ENCOUNTER — Encounter: Payer: Self-pay | Admitting: Plastic Surgery

## 2020-09-28 VITALS — BP 119/66 | HR 102 | Ht 71.0 in | Wt 345.0 lb

## 2020-09-28 DIAGNOSIS — L88 Pyoderma gangrenosum: Secondary | ICD-10-CM | POA: Diagnosis not present

## 2020-09-28 NOTE — Progress Notes (Signed)
Referring Provider Pleas Koch, NP [place],  [place] [address]   CC:  Chief Complaint  Patient presents with  . consult      [name] is an 40 y.o. male.  HPI: Patient presents with a chronic left lower extremity wound.  Is been present for 5 years.  He is ultimately been diagnosed with pyoderma gangrenosum.  He is under treatment with a dermatologist and has undergone multiple courses of topical and systemic steroids.  The wound size waxes and wanes.  He more recently underwent a long course of IV antibiotics for surrounding cellulitis.  He sent to me from the wound center to see if surgical treatment would be warranted.  He has noticed in the past when more subtle debridements were done in the wound center that it would make the wound worse.  Allergies  Allergen Reactions  . Biaxin [Clarithromycin] Other (See Comments)    Causes colitis flares  . Milk-Related Compounds Hives    Outpatient Encounter Medications as of 09/28/2020  Medication Sig  . acetaminophen (TYLENOL) 500 MG tablet Take 1,000 mg by mouth every 6 (six) hours as needed for headache (pain).  Marland Kitchen atorvastatin (LIPITOR) 40 MG tablet Take 1 tablet (40 mg total) by mouth daily. For cholesterol.  . blood glucose meter kit and supplies KIT Dispense based on patient and insurance preference. Use up to four times daily as directed. (FOR ICD-9 250.00, 250.01).  . clobetasol ointment (TEMOVATE) 6.44 % Apply 1 application topically 2 (two) times daily.  . Continuous Blood Gluc Receiver (FREESTYLE LIBRE 14 DAY READER) DEVI Use with sensor to check blood sugar.  . Continuous Blood Gluc Sensor (FREESTYLE LIBRE 14 DAY SENSOR) MISC Use as directed to check blood sugars.  . Dulaglutide (TRULICITY) 0.34 VQ/2.5ZD SOPN Inject 0.75 mg into the skin once a week. For diabetes.  . fluticasone (FLONASE) 50 MCG/ACT nasal spray Place 1 spray into both nostrils 2 (two) times daily.  Marland Kitchen ibuprofen (ADVIL,MOTRIN) 200 MG  tablet Take 400-600 mg by mouth every 6 (six) hours as needed for headache (pain).  . inFLIXimab (REMICADE) 100 MG injection Inject into the muscle once a week.  . insulin glargine (LANTUS SOLOSTAR) 100 UNIT/ML Solostar Pen Inject 30 Units into the skin at bedtime.  . metFORMIN (GLUCOPHAGE) 1000 MG tablet TAKE 1 TABLET BY MOUTH TWICE DAILY WITH MEALS FOR  DIABETES  . mupirocin ointment (BACTROBAN) 2 % Apply topically two (2) 60g times a day. Mix with clobetasol and apply twice daily  . nicotine (NICODERM CQ - DOSED IN MG/24 HOURS) 21 mg/24hr patch Place 1 patch (21 mg total) onto the skin daily. (Patient not taking: Reported on 09/28/2020)  . RELION PEN NEEDLES 31G X 6 MM MISC USE NIGHTLY WITH INSULIN AS DIRECTED  . SANTYL ointment Apply topically.  . triamcinolone ointment (KENALOG) 0.1 % Apply topically.  . varenicline (CHANTIX) 1 MG tablet Take 1/2 tablet daily for three days, then 1/2 tablet twice daily for five days, then 1 tablet twice daily thereafter for smoking cessation.  Marland Kitchen venlafaxine XR (EFFEXOR-XR) 150 MG 24 hr capsule Take 1 capsule (150 mg total) by mouth daily with breakfast. For depression.   No facility-administered encounter medications on file as of 09/28/2020.     Past Medical History:  Diagnosis Date  . Blood in stool   . Depression   . Elevated blood pressure   . Hyperlipidemia   . OSA (obstructive sleep apnea) 2014  . Seasonal allergies   .  Ulcerative colitis ([place]) 03/05/2018    Past Surgical History:  Procedure Laterality Date  . SEPTOPLASTY  2007  . WRIST SURGERY      Family History  Problem Relation Age of Onset  . Alcohol abuse Paternal Aunt   . Alcohol abuse Paternal Uncle   . Stroke Paternal Uncle   . Hyperlipidemia Maternal Grandfather   . Hypertension Maternal Grandfather   . Diabetes Maternal Grandfather   . Alcohol abuse Maternal Grandfather   . Multiple sclerosis Mother   . Dementia Mother     Social History   Social History Narrative    Married.   7 kids.   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   Enjoys target shooting.      Review of Systems General: Denies fevers, chills, weight loss CV: Denies chest pain, shortness of breath, palpitations  Physical Exam Vitals with BMI 09/28/2020 09/07/2020 08/22/2020  Height _0  _1  -  Weight 345 lbs 353 lbs 8 oz 357 lbs  BMI 48.14 73.57 89.78  Systolic 478 412 (No Data)  Diastolic 66 82 (No Data)  Pulse 102 73 -    General:  No acute distress,  Alert and oriented, Non-Toxic, Normal speech and affect Left lower extremity has significant lymphedema.  There is probably 10-15 size centimeter diameter wound at the junction of the middle and distal third laterally.  There is exposed lateral compartment musculature.  There is surrounding high her pigmentation of the skin.  There is no purulence.  Assessment/Plan Patient presents with a chronic wound from pyoderma gangrenosum.  I explained the challenges with this diagnosis.  I do not believe the skin graft would take in this location.  I suspect that an aggressive surgical debridement would make the wound worse which would be typical for pyoderma.  Unfortunately I do not think surgery is a good option for him.  I am happy to refer him out if he is interested in a second opinion.  All of his questions were answered.  [name] 09/28/2020, 4:44 PM

## 2020-10-02 ENCOUNTER — Encounter: Payer: 59 | Admitting: Physician Assistant

## 2020-10-02 ENCOUNTER — Other Ambulatory Visit: Payer: Self-pay

## 2020-10-02 DIAGNOSIS — L97321 Non-pressure chronic ulcer of left ankle limited to breakdown of skin: Secondary | ICD-10-CM | POA: Diagnosis not present

## 2020-10-02 NOTE — Progress Notes (Addendum)
[place], [place] ([id number]) Visit Report for 10/02/2020 Arrival Information Details Patient Name: [name], [name] Date of Service: 10/02/2020 8:15 AM Medical Record Number: [id number] Patient Account Number: [id number] Date of Birth/Sex: November 20, 1980 (40 y.o. M) Treating RN: Donnamarie Poag Primary Care Ta Fair: [name] Other Clinician: Referring Suheyla Mortellaro: [name] Treating Aayush Gelpi/Extender: [name] Cliche in Treatment: 199 Visit Information History Since Last Visit Added or deleted any medications: No Patient Arrived: Ambulatory Had a fall or experienced change in No Arrival Time: 08:24 activities of daily living that may affect Accompanied By: self risk of falls: Transfer Assistance: None Hospitalized since last visit: No Patient Identification Verified: Yes Has Dressing in Place as Prescribed: Yes Secondary Verification Process Completed: Yes Pain Present Now: No Patient Requires Transmission-Based Precautions: No Patient Has Alerts: Yes Electronic Signature(s) Signed: 10/03/2020 2:35:55 PM By: Donnamarie Poag Entered By: Donnamarie Poag on 10/02/2020 08:25:17 [name] ([id number]) -------------------------------------------------------------------------------- Clinic Level of Care Assessment Details Patient Name: [name] Date of Service: 10/02/2020 8:15 AM Medical Record Number: [id number] Patient Account Number: [id number] Date of Birth/Sex: [birth year] (40 y.o. M) Treating RN: Donnamarie Poag Primary Care Tyse Auriemma: [name] Other Clinician: Referring Emya Picado: [name] Treating Gar Glance/Extender: [name] Cliche in Treatment: 199 Clinic Level of Care Assessment Items TOOL 4 Quantity Score []  - Use when only an EandM is performed on FOLLOW-UP visit 0 ASSESSMENTS - Nursing Assessment / Reassessment []  - Reassessment of Co-morbidities (includes updates in patient status) 0 []  - 0 Reassessment of Adherence to Treatment Plan ASSESSMENTS - Wound and  Skin Assessment / Reassessment X - Simple Wound Assessment / Reassessment - one wound 1 5 []  - 0 Complex Wound Assessment / Reassessment - multiple wounds []  - 0 Dermatologic / Skin Assessment (not related to wound area) ASSESSMENTS - Focused Assessment []  - Circumferential Edema Measurements - multi extremities 0 []  - 0 Nutritional Assessment / Counseling / Intervention []  - 0 Lower Extremity Assessment (monofilament, tuning fork, pulses) []  - 0 Peripheral Arterial Disease Assessment (using hand held doppler) ASSESSMENTS - Ostomy and/or Continence Assessment and Care []  - Incontinence Assessment and Management 0 []  - 0 Ostomy Care Assessment and Management (repouching, etc.) PROCESS - Coordination of Care X - Simple Patient / Family Education for ongoing care 1 15 []  - 0 Complex (extensive) Patient / Family Education for ongoing care []  - 0 Staff obtains Programmer, systems, Records, Test Results / Process Orders []  - 0 Staff telephones HHA, Nursing Homes / Clarify orders / etc []  - 0 Routine Transfer to another Facility (non-emergent condition) []  - 0 Routine Hospital Admission (non-emergent condition) []  - 0 New Admissions / Biomedical engineer / Ordering NPWT, Apligraf, etc. []  - 0 Emergency Hospital Admission (emergent condition) X- 1 10 Simple Discharge Coordination []  - 0 Complex (extensive) Discharge Coordination PROCESS - Special Needs []  - Pediatric / Minor Patient Management 0 []  - 0 Isolation Patient Management []  - 0 Hearing / Language / Visual special needs []  - 0 Assessment of Community assistance (transportation, D/C planning, etc.) []  - 0 Additional assistance / Altered mentation []  - 0 Support Surface(s) Assessment (bed, cushion, seat, etc.) INTERVENTIONS - Wound Cleansing / Measurement Peedin, Raimundo ([id number]) X- 1 5 Simple Wound Cleansing - one wound []  - 0 Complex Wound Cleansing - multiple wounds []  - 0 Wound Imaging (photographs - any number  of wounds) []  - 0 Wound Tracing (instead of photographs) X- 1 5 Simple Wound Measurement - one wound []  - 0 Complex Wound Measurement - multiple wounds INTERVENTIONS - Wound  Dressings []  - Small Wound Dressing one or multiple wounds 0 X- 1 15 Medium Wound Dressing one or multiple wounds []  - 0 Large Wound Dressing one or multiple wounds []  - 0 Application of Medications - topical []  - 0 Application of Medications - injection INTERVENTIONS - Miscellaneous []  - External ear exam 0 []  - 0 Specimen Collection (cultures, biopsies, blood, body fluids, etc.) []  - 0 Specimen(s) / Culture(s) sent or taken to Lab for analysis []  - 0 Patient Transfer (multiple staff / [name] / Similar devices) []  - 0 Simple Staple / Suture removal (25 or less) []  - 0 Complex Staple / Suture removal (26 or more) []  - 0 Hypo / Hyperglycemic Management (close monitor of Blood Glucose) []  - 0 Ankle / Brachial Index (ABI) - do not check if billed separately X- 1 5 Vital Signs Has the patient been seen at the hospital within the last three years: Yes Total Score: 60 Level Of Care: New/Established - Level 2 Electronic Signature(s) Signed: 10/03/2020 2:35:55 PM By: Donnamarie Poag Entered By: Donnamarie Poag on 10/02/2020 08:48:47 [name] ([id number]) -------------------------------------------------------------------------------- Encounter Discharge Information Details Patient Name: [name] Date of Service: 10/02/2020 8:15 AM Medical Record Number: [id number] Patient Account Number: [id number] Date of Birth/Sex: 19-Apr-1981 (39 y.o. M) Treating RN: Donnamarie Poag Primary Care Lennie Vasco: [name] Other Clinician: Referring Viriginia Amendola: [name] Treating Emmelia Holdsworth/Extender: [name] Cliche in Treatment: 199 Encounter Discharge Information Items Discharge Condition: Stable Ambulatory Status: Ambulatory Discharge Destination: Home Transportation: Private Auto Accompanied By:  self Schedule Follow-up Appointment: Yes Clinical Summary of Care: Electronic Signature(s) Signed: 10/02/2020 5:31:07 PM By: Jeanine Luz Entered By: Jeanine Luz on 10/02/2020 09:12:55 [name] ([id number]) -------------------------------------------------------------------------------- Lower Extremity Assessment Details Patient Name: [name] Date of Service: 10/02/2020 8:15 AM Medical Record Number: [id number] Patient Account Number: [id number] Date of Birth/Sex: March 08, 1981 (39 y.o. M) Treating RN: Donnamarie Poag Primary Care Aeva Posey: [name] Other Clinician: Referring Rogina Schiano: [name] Treating Immaculate Crutcher/Extender: [name] Cliche in Treatment: 199 Edema Assessment Assessed: [Left: Yes] [name]: No] [Left: Edema] [Right: :] Calf Left: Right: Point of Measurement: 33 cm From Medial Instep 49.5 cm Ankle Left: Right: Point of Measurement: 12 cm From Medial Instep 30.5 cm Vascular Assessment Pulses: Dorsalis Pedis Palpable: [Left:Yes] Electronic Signature(s) Signed: 10/03/2020 2:35:55 PM By: Donnamarie Poag Entered By: Donnamarie Poag on 10/02/2020 08:30:47 Corne, [name] ([id number]) -------------------------------------------------------------------------------- Multi Wound Chart Details Patient Name: [name] Date of Service: 10/02/2020 8:15 AM Medical Record Number: [id number] Patient Account Number: [id number] Date of Birth/Sex: [birth year] (39 y.o. M) Treating RN: Donnamarie Poag Primary Care Chidinma Clites: [name] Other Clinician: Referring Yetunde Leis: [name] Treating Khamille Beynon/Extender: [name] Cliche in Treatment: 199 Vital Signs Height(in): 71 Pulse(bpm): 12 Weight(lbs): 338 Blood Pressure(mmHg): 116/77 Body Mass Index(BMI): 47 Temperature(F): 98.3 Respiratory Rate(breaths/min): 16 Photos: [N/A:N/A] Wound Location: Left, Lateral Lower Leg N/A N/A Wounding Event: Gradually Appeared N/A N/A Primary Etiology: Pyoderma N/A  N/A Comorbid History: Sleep Apnea, Hypertension, Colitis N/A N/A Date Acquired: 11/18/2015 N/A N/A Weeks of Treatment: 199 N/A N/A Wound Status: Open N/A N/A Measurements L x W x D (cm) 6.2x7x1 N/A N/A Area (cm) : 34.086 N/A N/A Volume (cm) : 34.086 N/A N/A % Reduction in Area: -594.40% N/A N/A % Reduction in Volume: -768.00% N/A N/A Classification: Full Thickness With Exposed N/A N/A Support Structures Exudate Amount: Medium N/A N/A Exudate Type: Serosanguineous N/A N/A Exudate Color: red, brown N/A N/A Wound Margin: Epibole N/A N/A Granulation Amount: Medium (34-66%) N/A N/A Granulation Quality: Red,  Pink N/A N/A Necrotic Amount: Medium (34-66%) N/A N/A Exposed Structures: Fat Layer (Subcutaneous Tissue): N/A N/A Yes Muscle: Yes Fascia: No Tendon: No Joint: No Bone: No Epithelialization: Small (1-33%) N/A N/A Treatment Notes Electronic Signature(s) Signed: 10/03/2020 2:35:55 PM By: Donnamarie Poag Entered By: Donnamarie Poag on 10/02/2020 08:46:07 [name] ([id number]) -------------------------------------------------------------------------------- [name] Details Patient Name: [name] Date of Service: 10/02/2020 8:15 AM Medical Record Number: [id number] Patient Account Number: [id number] Date of Birth/Sex: 1981/04/30 (40 y.o. M) Treating RN: Donnamarie Poag Primary Care Laddie Naeem: [name] Other Clinician: Referring Latrena Benegas: [name] Treating Annalia Metzger/Extender: [name] Cliche in Treatment: [place] reviewed with physician Active Inactive Electronic Signature(s) Signed: 10/03/2020 2:35:55 PM By: Donnamarie Poag Entered By: Donnamarie Poag on 10/02/2020 08:45:58 [name] ([id number]) -------------------------------------------------------------------------------- Pain Assessment Details Patient Name: [name] Date of Service: 10/02/2020 8:15 AM Medical Record Number: [id number] Patient Account Number:  [id number] Date of Birth/Sex: 1980-09-23 (39 y.o. M) Treating RN: Donnamarie Poag Primary Care Carlethia Mesquita: [name] Other Clinician: Referring Adela Esteban: [name] Treating Abriel Hattery/Extender: [name] Cliche in Treatment: 199 Active Problems Location of Pain Severity and Description of Pain Patient Has Paino No Site Locations Rate the pain. Current Pain Level: 0 Pain Management and Medication Current Pain Management: Electronic Signature(s) Signed: 10/03/2020 2:35:55 PM By: Donnamarie Poag Entered By: Donnamarie Poag on 10/02/2020 08:25:42 [name] ([id number]) -------------------------------------------------------------------------------- Patient/Caregiver Education Details Patient Name: [name] Date of Service: 10/02/2020 8:15 AM Medical Record Number: [id number] Patient Account Number: [id number] Date of Birth/Gender: September 05, 1980 (39 y.o. M) Treating RN: Donnamarie Poag Primary Care Physician: [name] Other Clinician: Referring Physician: [name] Treating Physician/Extender: [name] Cliche in Treatment: 199 Education Assessment Education Provided To: Patient Education Topics Provided Smoking and Wound Healing: Methods: Explain/Verbal Responses: State content correctly Venous: Methods: Explain/Verbal Wound/Skin Impairment: Methods: Explain/Verbal Electronic Signature(s) Signed: 10/03/2020 2:35:55 PM By: Donnamarie Poag Entered By: Donnamarie Poag on 10/02/2020 08:49:38 Shakespeare, [name] ([id number]) -------------------------------------------------------------------------------- Wound Assessment Details Patient Name: [name] Date of Service: 10/02/2020 8:15 AM Medical Record Number: [id number] Patient Account Number: [id number] Date of Birth/Sex: [birth year] (39 y.o. M) Treating RN: Donnamarie Poag Primary Care Paloma Grange: [name] Other Clinician: Referring Khali Perella: [name] Treating Hung Rhinesmith/Extender: [name] Cliche in Treatment:  199 Wound Status Wound Number: 1 Primary Etiology: Pyoderma Wound Location: Left, Lateral Lower Leg Wound Status: Open Wounding Event: Gradually Appeared Comorbid History: Sleep Apnea, Hypertension, Colitis Date Acquired: 11/18/2015 Weeks Of Treatment: 199 Clustered Wound: No Photos Wound Measurements Length: (cm) 6.2 Width: (cm) 7 Depth: (cm) 1 Area: (cm) 34.086 Volume: (cm) 34.086 % Reduction in Area: -594.4% % Reduction in Volume: -768% Epithelialization: Small (1-33%) Tunneling: No Undermining: No Wound Description Classification: Full Thickness With Exposed Support Structures Wound Margin: Epibole Exudate Amount: Medium Exudate Type: Serosanguineous Exudate Color: red, brown Foul Odor After Cleansing: No Slough/Fibrino Yes Wound Bed Granulation Amount: Medium (34-66%) Exposed Structure Granulation Quality: Red, Pink Fascia Exposed: No Necrotic Amount: Medium (34-66%) Fat Layer (Subcutaneous Tissue) Exposed: Yes Necrotic Quality: Adherent Slough Tendon Exposed: No Muscle Exposed: Yes Necrosis of Muscle: No Joint Exposed: No Bone Exposed: No Treatment Notes Wound #1 (Lower Leg) Wound Laterality: Left, Lateral Cleanser Soap and Water Discharge Instruction: Gently cleanse wound with antibacterial soap, rinse and pat dry prior to dressing wounds Agro, [place] ([id number]) Peri-Wound Care Topical Triamcinolone Acetonide Cream, 0.1%, 15 (g) tube Discharge Instruction: to wound bed , lightly Primary Dressing Secondary Dressing Mepilex Border Flex, 6x6 (in/in) Discharge Instruction: Apply to wound as directed. Do  not cut. Secured With Compression Wrap Compression Stockings Add-Ons tubi grip F Discharge Instruction: apply double layer to lower leg Electronic Signature(s) Signed: 10/03/2020 2:35:55 PM By: Donnamarie Poag Entered By: Donnamarie Poag on 10/02/2020 08:29:49 [name]  ([id number]) -------------------------------------------------------------------------------- Vitals Details Patient Name: [name] Date of Service: 10/02/2020 8:15 AM Medical Record Number: [id number] Patient Account Number: [id number] Date of Birth/Sex: 1980/07/22 (39 y.o. M) Treating RN: Donnamarie Poag Primary Care [name] Renfrew: [name] Other Clinician: Referring Saagar Tortorella: [name] Treating Skanda Worlds/Extender: [name] Cliche in Treatment: 199 Vital Signs Time Taken: 08:26 Temperature (F): 98.3 Height (in): 71 Pulse (bpm): 82 Weight (lbs): 338 Respiratory Rate (breaths/min): 16 Body Mass Index (BMI): 47.1 Blood Pressure (mmHg): 116/77 Reference Range: 80 - 120 mg / dl Electronic Signature(s) Signed: 10/03/2020 2:35:55 PM By: Donnamarie Poag Entered ByDonnamarie Poag on 10/02/2020 08:25:35

## 2020-10-02 NOTE — Progress Notes (Addendum)
[name], [name] ([id number]) Visit Report for 10/02/2020 Chief Complaint Document Details Patient Name: [name], [name] Date of Service: 10/02/2020 8:15 AM Medical Record Number: [id number] Patient Account Number: [id number] Date of Birth/Sex: [birth year] (40 y.o. 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 M) Treating RN: Donnamarie Poag Primary Care Provider: [name] Other Clinician: Referring Provider: [name] Treating Provider/Extender: [name] Cliche in Treatment: 199 History of Present Illness HPI Description: 12/04/16; 40 year old man who comes into the clinic today for review of a wound on the posterior left calf. He tells me that is been there for about a year. He is not a diabetic he does smoke half a pack per day. He was seen in the ER on 11/20/16 felt to have cellulitis around the wound and was given clindamycin. An x-ray did not show osteomyelitis. The patient initially tells me that he has a milk allergy that sets off a pruritic itching rash on his lower legs which she scratches incessantly and he thinks that's what may have set up the wound. He has been using various topical antibiotics and ointments without any effect. He works in a [employer] and is on his feet all day. He does not have a prior history  of wounds however he does have the rash on both lower legs the right arm and the ventral aspect of his left arm. These are excoriations and clearly have had scratching however there are of macular looking areas on both legs including a substantial larger area on the right leg. This does not have an underlying open area. There is no blistering. The patient tells me that 2 years ago in [place] in response to the rash on his legs he saw a dermatologist who told him he had a condition which may be pyoderma gangrenosum although I may be putting words into his mouth. He seemed to recognize this. 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 He is not in any treatment for this. He's had no recent travel 12/11/16; the patient arrives today with his wound and roughly the same condition we've been using silver alginate this is a deep punched out wound with some surrounding erythema but no tenderness. Biopsy I did did not show confirmed pyoderma gangrenosum suggested nonspecific inflammation and vasculitis but does not provide an actual description of what was seen by the pathologist. I'm really not able to understand this We have also received information from the patient's dermatologist in [place] notes from April 2016. This was a doctor Agarwal-antal. 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 He was prescribed topical steroid high potency under occlusion which helped but at this point the patient did not have a deep punched out wound. 12/18/16; the patient's wound is larger in terms of surface area however this surface looks better and there is less depth. The surrounding erythema also is better. The patient states that the wrap we put on came off 2 days ago when he has been using his compression stockings.  He we are in the process of getting a dermatology consult. 12/26/16 on evaluation today patient's left lower extremity wound shows evidence of infection with  surrounding erythema noted. He has been tolerating the dressing changes but states that he has noted more discomfort. There is a larger area of erythema surrounding the wound. No fevers, chills, nausea, or vomiting noted at this time. With that being said the wound still does have slough covering the surface. He is not allergic to any medication that he is aware of at this point. In regard to his right lower extremity he had several regions that are erythematous and pruritic he wonders if there's anything we can do to help that. 01/02/17 I reviewed patient's wound culture which was obtained his visit last week. He was placed on doxycycline at that point. Unfortunately that does not appear to be an antibiotic that would likely help with the situation however the pseudomonas noted on culture is sensitive to Cipro. Also unfortunately patient's wound seems to have a large compared to last week's evaluation. Not severely so but there are definitely increased measurements in general. He is continuing to have discomfort as well he writes this to be a seven out of 10. In fact he would prefer me not to perform any debridement today due to the fact that he is having discomfort and considering he has an active infection on the little reluctant to do so anyway. No fevers, chills, nausea, or vomiting noted at this time. 01/08/17; patient seems dermatology on September 5. I suspect dermatology will want the slides from the biopsy I did sent to their pathologist. I'm not sure if there is a way we can expedite that. In any case the culture I did before I left on vacation 3 weeks ago showed Pseudomonas he was given 10 days of Cipro and per her description of her intake nurses is actually somewhat better this week although the wound is quite a bit bigger than I remember the last time I saw this. He still has 3 more days of Cipro 01/21/17; dermatology appointment tomorrow. He has completed the ciprofloxacin for Pseudomonas.  Surface of the wound looks better however he is had some deterioration in the lesions on his right leg. Meantime the left lateral leg wound we will continue with sample 01/29/17; patient had his dermatology appointment but I can't yet see that note. He is completed his antibiotics. The wound is more superficial but considerably larger in circumferential area than when he came in. This is in his left lateral calf. He also has swollen erythematous areas with superficial wounds on the right leg and small papular areas on both arms. There apparently areas in her his upper thighs and buttocks I did not look at those. Dermatology biopsied the right leg. Hopefully will have their input next week. 02/05/17; patient went back to see his dermatologist who told him that he had a "scratching problem" as well as staph. He is now on a 30 day course of doxycycline and I believe she gave him triamcinolone cream to the right leg areas to help with the itching [not exactly sure but probably triamcinolone]. She apparently looked at the left lateral leg wound although this was not rebiopsied and I think felt to be ultimately part of the same pathogenesis. He is using sample border foam and changing nevus himself. He now has a new open area on the right posterior leg which was his biopsy site I don't have any of the dermatology notes  02/12/17; we put the patient in compression last week with SANTYL to the wound on the left leg and the biopsy. Edema is much better and the depth of the wound is now at level of skin. Area is still the same oBiopsy site on the right lateral leg we've also been using santyl with a border foam dressing and he is changing this himself. 02/19/17; Using silver alginate started last week to both the substantial left leg wound and the biopsy site on the right wound. He is tolerating compression well. Has a an appointment with his primary M.D. tomorrow wondering about diuretics although I'm wondering if  the edema problem is actually lymphedema 02/26/17; the patient has been to see his primary doctor Dr. Jerrel Ivory at [place] our primary care. She started him on Lasix 20 mg and this seems to have helped with the edema. However we are not making substantial change with the left lateral calf wound and inflammation. The biopsy site on the right leg also looks stable but not really all that different. 03/12/17; the patient has been to see vein and vascular Dr. Lucky Cowboy. He has had venous reflux studies I have not reviewed these. I did get a call from his dermatology office. They felt that he might have pathergy based on their biopsy on his right leg which led them to look at the slides of [name], [name] ([id number]) the biopsy I did on the left leg and they wonder whether this represents pyoderma gangrenosum which was the original supposition in a man with ulcerative colitis albeit inactive for many years. They therefore recommended clobetasol and tetracycline i.e. 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 03/26/17; apparently the patient just had reflux studies not an appointment with Dr. dew. She arrives in clinic today having applied clobetasol for 2-3 weeks. He notes over the last 2-3 days excessive drainage having to change the dressing 3-4 times a day and also expanding erythema. 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 Lillia Carmel at [facility] dermatology locally who kindly saw him at my request. 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 My concern was the degree of erythema medially extending from his left leg wound which was either inflammation from pyoderma or cellulitis. I  put him on Augmentin however culture of the wound showed Pseudomonas which is quinolone sensitive. I really don't believe he has cellulitis however in view of everything I will continue and give him a course of Cipro. He is also on doxycycline as an immune modulator for the pyoderma. In addition to his original wound on the left lateral leg with surrounding erythema he has a wound on the right posterior calf which was an original biopsy site done by dermatology. 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 Saw Dr. Lillia Carmel yesterday. He has been using topical antibiotics to both wound areas his original wound on the left and the biopsies/pathergy area on the right. There is definitely some improvement in the inflammation around the wound on the right although the patient states he has increasing sensitivity of the wounds. He is on prednisone 60 and doxycycline 1 as prescribed by Dr. Lillia Carmel. He is covering the topical antibiotic with gauze  and putting this in his own compression stocks and changing this daily. He states that Dr. Lottie Rater did a culture of the left leg wound yesterday 05/07/17; pyoderma gangrenosum. The patient saw Dr. Lillia Carmel yesterday and has a follow-up with her in one month. He is still using topical antibiotics to both wounds although he can't recall exactly what type. He is still on prednisone 60 mg. Dr. Lillia Carmel stated that the doxycycline could stop if we were in agreement. 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 The right medial leg was induced by biopsy/pathergy. The area on the right is essentially healed. 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 The right medial leg has closed and remains closed. He is still on  prednisone 60. oHe tells me he missed his last dermatology appointment with Dr. Lillia Carmel but will make another appointment. He reports that her blood sugar at a recent screen in [place] was high 200's. He was 180 today. 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 I think he is going to require still much longer prednisone perhaps another 3 months before attempting to taper. In the meantime his wound is a lot better. Smaller. He is cleaning this off daily and applying topical antibiotics. When he was last in the clinic I thought about changing to [facility] and actually put in a couple of calls to dermatology although probably not during their business hours. 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 He continues on oral prednisone. He has been using triple antibiotic ointment. There is surface debris and we will transition to [facility] and have him return in 2 weeks. He has lost 30 pounds since his last appointment with lifestyle modification. He may benefit from topical steroid cream for treatment this can be considered at a later date. 08/22/17 on evaluation today patient appears to actually be doing rather well in regard to his left lateral lower extremity ulcer. He has actually been managed by Dr. Dellia Nims most recently. Patient is currently on oral steroids at this time. This seems to have been of benefit for him. Nonetheless his last visit was actually with Leah on 08/06/17. Currently he is not utilizing any topical steroid creams although this could be of benefit as well. No fevers, chills, nausea, or vomiting noted at this time. 09/05/17 on evaluation today patient appears to be doing better in regard to his left lateral lower extremity ulcer. He has been tolerating the dressing changes without complication. He is using Santyl with good effect. Overall I'm very  pleased with how things are standing at this point. Patient likewise is happy that this is doing better. 09/19/17 on evaluation today patient actually appears to be doing rather well in regard to his left lateral lower extremity ulcer. 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 He's not having any significant pain. 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 He has been tolerating the Santyl without complication and in general I feel like he's making good progress. 10/17/17 on evaluation today patient  appears to be doing very well in regard to his left lateral lower surety ulcer. He has been tolerating the dressing changes without complication. There does not appear to be any evidence of infection he's alternating the Santyl and the triple antibiotic ointment every other day this seems to be doing well for him. 11/03/17 on evaluation today patient appears to be doing very well in regard to his left lateral lower extremity ulcer. He is been tolerating the dressing changes without complication which is good news. Fortunately there does not appear to be any evidence of infection which is also great news. Overall is doing excellent they are starting to taper down on the prednisone is down to 40 mg at this point it also started topical clobetasol for him. 11/17/17 on evaluation today patient appears to be doing well in regard to his left lateral lower surety ulcer. He's been tolerating the dressing changes without complication. He does note that he is having no pain, no excessive drainage or discharge, and overall he feels like things are going about how he would expect and hope they would. Overall he seems to have no evidence of infection at this time in my opinion which is good news. 12/04/17-He is seen in follow-up evaluation  for right lateral lower extremity ulcer. He has been applying topical steroid cream. Today's measurement show slight increase in size. Over the next 2 weeks we will transition to every other day Santyl and steroid cream. He has been encouraged to monitor for changes and notify clinic with any concerns 12/15/17 on evaluation today patient's left lateral motion the ulcer and fortunately is doing worse again at this point. This just since last week to this week has close to doubled in size according to the patient. I did not seeing last week's I do not have a visual to compare this to in our system was also down so we do not have all the charts and at this point. Nonetheless it does have me somewhat concerned in regard to the fact that again he was worried enough about it he has contact the dermatology that placed them back on the full strength, 50 mg a day of the prednisone that he was taken previous. He continues to alternate using clobetasol along with Santyl at this point. He is obviously somewhat frustrated. 12/22/17 on evaluation today patient appears to be doing a little worse compared to last evaluation. Unfortunately the wound is a little deeper and slightly larger than the last week's evaluation. With that being said he has made some progress in regard to the irritation surrounding at this time unfortunately despite that progress that's been made he still has a significant issue going on here. 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 [name], [name] ([id number]) For that reason I discussed with him today the possibility of perform a wound culture to ensure there's nothing overtly infected. 01/06/18 on evaluation today patient's wound is larger and deeper than previously evaluated. With that being said it did appear that his wound was infected after  my last evaluation with him. Subsequently I did end up prescribing a prescription for Bactrim DS which she has been taking and having no complication with. Fortunately there does not appear to be any evidence of infection at this point in time as far as anything spreading, no want to touch, and overall I feel like  things are showing signs of improvement. 01/13/18 on evaluation today patient appears to be even a little larger and deeper than last time. There still muscle exposed in the base of the wound. Nonetheless he does appear to be less erythematous I do believe inflammation is calming down also believe the infection looks like it's probably resolved at this time based on what I'm seeing. No fevers, chills, nausea, or vomiting noted at this time. 01/30/18 on evaluation today patient actually appears to visually look better for the most part. Unfortunately those visually this looks better he does seem to potentially have what may be an abscess in the muscle that has been noted in the central portion of the wound. This is the first time that I have noted what appears to be fluctuance in the central portion of the muscle. With that being said I'm somewhat more concerned about the fact that this might indicate an abscess formation at this location. I do believe that an ultrasound would be appropriate. This is likely something we need to try to do as soon as possible. He has been switch to mupirocin ointment and he is no longer using the steroid ointment as prescribed by dermatology he sees them again next week he's been decreased from 60 to 40 mg of prednisone. 03/09/18 on evaluation today patient actually appears to be doing a little better compared to last time I saw him. There's not as much erythema surrounding the wound itself. He I did review his most recent infectious disease note which was dated 02/24/18. He saw Dr. Michel Bickers in [place]. 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 Patient is now off of antibiotics and they are continually observing this. There seems to be no change in the past two weeks in my pinion based on what the patient says and what I see today compared to what Dr. Megan Salon likely saw two weeks ago. No fevers, chills, nausea, or vomiting noted at this time. 03/23/18 on evaluation today patient's wound actually appears to be showing signs of improvement which is good news. He is currently still on the Dapsone. He is also working on tapering the prednisone to get off of this and Dr. Lottie Rater is working with him in this regard. Nonetheless overall I feel like the wound is doing well it does appear based on the infectious disease note that I reviewed from Dr. 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 I did also review the results of his ultrasound of left lower extremity which revealed there was a dentist tissue in the base of the wound without an abscess noted. 04/06/18 on evaluation today the patient's left lateral lower extremity ulcer actually appears to be doing fairly well which is excellent news. There does not appear to be any evidence of infection at this time which is also great news. Overall he still does have a significantly large ulceration although little by little he seems to be making progress. He is down to 10 mg a day of the prednisone. 04/20/18 on evaluation today patient actually appears to be doing excellent at this time in regard to his left lower extremity ulcer. He's making signs of good progress unfortunately this is taking much longer than we would really like to see but nonetheless he is  making progress. Fortunately there does not appear to be any evidence of infection at this time. No fevers, chills, nausea, or vomiting noted at this time.  The patient has not been using the Santyl due to the cost he hadn't got in this field yet. He's mainly been using the antibiotic ointment topically. Subsequently he also tells me that he really has not been scrubbing in the shower I think this would be helpful again as I told him it doesn't have to be anything too aggressive to even make it believe just enough to keep it free of some of the loose slough and biofilm on the wound surface. 05/11/18 on evaluation today patient's wound appears to be making slow but sure progress in regard to the left lateral lower extremity ulcer. He is been tolerating the dressing changes without complication. Fortunately there does not appear to be any evidence of infection at this time. He is still just using triple antibiotic ointment along with clobetasol occasionally over the area. He never got the Santyl and really does not seem to intend to in my pinion. 06/01/18 on evaluation today patient appears to be doing a little better in regard to his left lateral lower extremity ulcer. He states that overall he does not feel like he is doing as well with the Dapsone as he did with the prednisone. Nonetheless he sees his dermatologist later today and is gonna talk to them about the possibility of going back on the prednisone. Overall again I believe that the wound would be better if you would utilize Santyl but he really does not seem to be interested in going back to the [place] at this point. He has been using triple antibiotic ointment. 06/15/18 on evaluation today patient's wound actually appears to be doing about the same at this point. Fortunately there is no signs of infection at this time. He has made slight improvements although he continues to not really want to clean the wound bed at this point. He states that he just doesn't mess with it he doesn't want to cause any problems with everything else he has going on. He has been on medication, antibiotics as prescribed  by his dermatologist, for a staff infection of his lower extremities which is really drying out now and looking much better he tells me. Fortunately there is no sign of overall infection. 06/29/18 on evaluation today patient appears to be doing well in regard to his left lateral lower surety ulcer all things considering. Fortunately his staff infection seems to be greatly improved compared to previous. He has no signs of infection and this is drying up quite nicely. He is still the doxycycline for this is no longer on cental, Dapsone, or any of the other medications. His dermatologist has recommended possibility of an infusion but right now he does not want to proceed with that. 07/13/18 on evaluation today patient appears to be doing about the same in regard to his left lateral lower surety ulcer. Fortunately there's no signs of infection at this time which is great news. Unfortunately he still builds up a significant amount of Slough/biofilm of the surface of the wound he still is not really cleaning this as he should be appropriately. Again I'm able to easily with saline and gauze remove the majority of this on the surface which if you would do this at home would likely be a dramatic improvement for him as far as getting the area to improve. Nonetheless overall I still feel like he  is making progress is just very slow. I think Santyl will be of benefit for him as well. Still he has not gotten this as of this point. 07/27/18 on evaluation today patient actually appears to be doing little worse in regards of the erythema around the periwound region of the wound he also tells me that he's been having more drainage currently compared to what he was experiencing last time I saw him. He states not quite as bad as what he had because this was infected previously but nonetheless is still appears to be doing poorly. Fortunately there is no evidence of systemic infection at this point. The patient tells me that  he is not going to be able to afford the Santyl. He is still waiting to hear about the infusion therapy with his dermatologist. Apparently she wants an updated colonoscopy first. 08/10/18 on evaluation today patient appears to be doing better in regard to his left lateral lower extremity ulcer. Fortunately he is showing signs of improvement in this regard he's actually been approved for Remicade infusion's as well although this has not been scheduled as of yet. Fortunately there's no signs of active infection at this time in regard to the wound although he is having some issues with infection of the right lower extremity is been seen as dermatologist for this. Fortunately they are definitely still working with him trying to keep things under control. TSUNEO, [name] ([id number]) 09/07/18 on evaluation today patient is actually doing rather well in regard to his left lateral lower extremity ulcer. He notes these actually having some hair grow back on his extremity which is something he has not seen in years. He also tells me that the pain is really not giving them any trouble at this time which is also good news overall she is very pleased with the progress he's using a combination of the mupirocin along with the probate is all mixed. 09/21/18 on evaluation today patient actually appears to be doing fairly well all things considered in regard to his looks from the ulcer. He's been tolerating the dressing changes without complication. Fortunately there's no signs of active infection at this time which is good news he is still on all antibiotics or prevention of the staff infection. He has been on prednisone for time although he states it is gonna contact his dermatologist and see if she put them on a short course due to some irritation that he has going on currently. Fortunately there's no evidence of any overall worsening this is going very slow I think cental would be something that would be helpful for him  although he states that $50 for tube is quite expensive. He therefore is not willing to get that at this point. 10/06/18 on evaluation today patient actually appears to be doing decently well in regard to his left lateral leg ulcer. He's been tolerating the dressing changes without complication. Fortunately there's no signs of active infection at this time. Overall I'm actually rather pleased with the progress he's making although it's slow he doesn't show any signs of infection and he does seem to be making some improvement. I do believe that he may need a switch up and dressings to try to help this to heal more appropriately and quickly. 10/19/18 on evaluation today patient actually appears to be doing better in regard to his left lateral lower extremity ulcer. This is shown signs of having much less Slough buildup at this point due to the fact he has been using  the Santyl. Obviously this is very good news. The overall size of the wound is not dramatically smaller but again the appearance is. 11/02/18 on evaluation today patient actually appears to be doing quite well in regard to his lower [place] ulcer. A lot of the skin around the ulcer is actually somewhat irritating at this point this seems to be more due to the dressing causing irritation from the adhesive that anything else. Fortunately there is no signs of active infection at this time. 11/24/18 on evaluation today patient appears to be doing a little worse in regard to his overall appearance of his lower extremity ulcer. There's more erythema and warmth around the wound unfortunately. He is currently on doxycycline which he has been on for some time. With that being said I'm not sure that seems to be helping with what appears to possibly be an acute cellulitis with regard to his left lower extremity ulcer. No fevers, chills, nausea, or vomiting noted at this time. 12/08/18 on evaluation today patient's wounds actually appears to be doing  significantly better compared to his last evaluation. He has been using Santyl along with alternating tripling about appointment as well as the steroid cream seems to be doing quite well and the wound is showing signs of improvement which is excellent news. Fortunately there's no evidence of infection and in fact his culture came back negative with only normal skin flora noted. 12/21/2018 upon evaluation today patient actually appears to be doing excellent with regard to his ulcer. This is actually the best that I have seen it since have been helping to take care of him. It is both smaller as well as less slough noted on the surface of the wound and seems to be showing signs of good improvement with new skin growing from the edges. He has been using just the triamcinolone he does wonder if he can get a refill of that ointment today. 01/04/2019 upon evaluation today patient actually appears to be doing well with regard to his left lateral lower extremity ulcer. With that being said it does not appear to be that he is doing quite as well as last time as far as progression is concerned. There does not appear to be any signs of infection or significant irritation which is good news. With that being said I do believe that he may benefit from switching to a collagen based dressing based on how clean The wound appears. 01/18/2019 on evaluation today patient actually appears to be doing well with regard to his wound on the left lower extremity. He is not made a lot of progress compared to where we were previous but nonetheless does seem to be doing okay at this time which is good news. There is no signs of active infection which is also good news. My only concern currently is I do wish we can get him into utilizing the collagen dressing his insurance would not pay for the supplies that we ordered although it appears that he may be able to order this through his supply company that he typically utilizes. This is  [employer]. Nonetheless he did try to order it during the office visit today and it appears this did go through. We will see if he can get that it is a different brand but nonetheless he has collagen and I do think will be beneficial. 02/01/2019 on evaluation today patient actually appears to be doing a little worse today in regard to the overall size of his wounds. Fortunately there  is no signs of active infection at this time. That is visually. Nonetheless when this is happened before it was due to infection. For that reason were somewhat concerned about that this time as well. 02/08/2019 on evaluation today patient unfortunately appears to be doing slightly worse with regard to his wound upon evaluation today. Is measuring a little deeper and a little larger unfortunately. I am not really sure exactly what is causing this to enlarge he actually did see his dermatologist she is going to see about initiating Humira for him. Subsequently she also did do steroid injections into the wound itself in the periphery. Nonetheless still nonetheless he seems to be getting a little bit larger he is gone back to just using the steroid cream topically which I think is appropriate. I would say hold off on the collagen for the time being is definitely a good thing to do. Based on the culture results which we finally did get the final result back regarding it shows staph as the bacteria noted again that can be a normal skin bacteria based on the fact however he is having increased drainage and worsening of the wound measurement wise I would go ahead and place him on an antibiotic today I do believe for this. 02/15/2019 on evaluation today patient actually appears to be doing somewhat better in regard to his ulcer. There is no signs of worsening at this time I did review his culture results which showed evidence of Staphylococcus aureus but not MRSA. Again this could just be more related to the normal skin bacteria  although he states the drainage has slowed down quite a bit he may have had a mild infection not just colonization. And was much smaller and then since around10/04/2019 on evaluation today patient appears to be doing unfortunately worse as far as the size of the wound. I really feel like that this is steadily getting larger again it had been doing excellent right at the beginning of September we have seen a steady increase in the area of the wound it is almost 2-1/2 times the size it was on September 1. Obviously this is a bad trend this is not wanting to see. For that reason we went back to using just the topical triamcinolone cream which does seem to help with inflammation. I checked him for bacteria by way of culture and nothing showed positive there. I am considering giving him a short course of a tapering steroid Dosepak today to see if that is can be beneficial for him. The patient is in agreement with giving that a try. 03/08/2019 on evaluation today patient appears to be doing very well in comparison to last evaluation with regard to his lower extremity ulcer. This is showing signs of less inflammation and actually measuring slightly smaller compared to last time every other week over the past month and a half he has been measuring larger larger larger. Nonetheless I do believe that the issue has been inflammation the prednisone does seem to [facility], [name] ([id number]) have been beneficial for him which is good news. No fevers, chills, nausea, vomiting, or diarrhea. 03/22/2019 on evaluation today patient appears to be doing about the same with regard to his leg ulcer. He has been tolerating the dressing changes without complication. With that being said the wound seems to be mostly arrested at its current size but really is not making any progress except for when we prescribed the prednisone. He did show some signs of dropping as far as  the overall size of the wound during that interval week.  Nonetheless this is something he is not on long-term at this point and unfortunately I think he is getting need either this or else the Humira which his dermatologist has discussed try to get approval for. With that being said he will be seeing his dermatologist on the 11th of this month that is November. 04/19/2019 on evaluation today patient appears to be doing really about the same the wound is measuring slightly larger compared to last time I saw him. He has not been into the office since November 2 due to the fact that he unfortunately had Covid as that his entire family. He tells me that it was rough but they did pull-through and he seems to be doing much better. Fortunately there is no signs of active infection at this time. No fevers, chills, nausea, vomiting, or diarrhea. 05/10/2019 on evaluation today patient unfortunately appears to be doing significantly worse as compared to last time I saw him. He does tell me that he has had his first dose of Humira and actually is scheduled to get the next one in the upcoming week. With that being said he tells me also that in the past several days he has been having a lot of issues with green drainage she showed me a picture this is more blue-green in color. He is also been having issues with increased sloughy buildup and the wound does appear to be larger today. Obviously this is not the direction that we want everything to take based on the starting of his Humira. Nonetheless I think this is definitely a result of likely infection and to be honest I think this is probably Pseudomonas causing the infection based on what I am seeing. 05/24/2019 on evaluation today patient unfortunately appears to be doing significantly worse compared to his prior evaluation with me 2 weeks ago. I did review his culture results which showed that he does have Staph aureus as well as Pseudomonas noted on the culture. Nonetheless the Levaquin that I prescribed for him does  not appear to have been appropriate and in fact he tells me he is no longer experiencing the green drainage and discharge that he had at the last visit. Fortunately there is no signs of active infection at this time which is good news although the wound has significantly worsened it in fact is much deeper than it was previous. We have been utilizing up to this point triamcinolone ointment as the prescription topical of choice but at this time I really feel like that the wound is getting need to be packed in order to appropriately manage this due to the deeper nature of the wound. Therefore something along the lines of an alginate dressing may be more appropriate. 05/31/2019 upon inspection today patient's wound actually showed signs of doing poorly at this point. Unfortunately he just does not seem to be making any good progress despite what we have tried. He actually did go ahead and pick up the Cipro and start taking that as he was noticing more green drainage he had previously completed the Levaquin that I prescribed for him as well. Nonetheless he missed his appointment for the seventh last week on Wednesday with the wound care center and [facility] where his dermatologist referred him. Obviously I do think a second opinion would be helpful at this point especially in light of the fact that the patient seems to be doing so poorly despite the fact  that we have tried everything that I really know how at this point. The only thing that ever seems to have helped him in the past is when he was on high doses of continual steroids that did seem to make a difference for him. 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 06/07/2019 upon evaluation today patient unfortunately appears to be doing worse yet again with regard to his wound. In fact I am  starting to question whether or not he may have a fluid pocket in the muscle at this point based on the bulging and the soft appearance to the central portion of the muscle area. There is not anything draining from the muscle itself at this time which is good news but nonetheless the wound is expanding. I am not really seeing any results of the Humira as far as overall wound progression based on what I am seeing at this point. The patient has been referred for second opinion with regard to his wound to the [facility] wound care center by his dermatologist which I definitely am not in opposition to. Unfortunately we tried multiple dressings in the past including collagen, alginate, and at one point even Hydrofera Blue. With that being said he is never really used it for any significant amount of time due to the fact that he often complains of pain associated with these dressings and then will go back to either using the Santyl which she has done intermittently or more frequently the triamcinolone. He is also using his own compression stockings. We have wrapped him in the past but again that was something else that he really was not a big fan of. 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 He seemed to do much better when he was on the steroids although honestly I understand that the steroids are not necessarily the best medication to be on long-term obviously 06/14/2019 on evaluation today patient appears to be doing actually a little bit better with regard to the overall appearance with his leg. Unfortunately he does continue to have issues with what appears to be some fluid underneath the  muscle although he did see the wound specialty center at [facility] last week their main goals were to see about infusion therapy in place of the Humira as they feel like that is not quite strong enough. They also recommended that we continue with the treatment otherwise as we are they felt like that was appropriate and they are okay with him continuing to follow-up here with [place] in that regard. With that being said they are also sending him to the vein specialist there to see about vein stripping and if that would be of benefit for him. Subsequently they also did not really address whether or not an ultrasound of the muscle area to see if there is anything that needs to be addressed here would be appropriate or not. For that reason I discussed this with him last week I think we  may proceed down that road at this point. 06/21/2019 upon evaluation today patient's wound actually appears to be doing slightly better compared to previous evaluations. I do believe that he has made a difference with regard to the progression here with the use of oral steroids. Again in the past has been the only thing that is really calm things down. He does tell me that from [facility] is gotten a good news from there that there are no further vein stripping that is necessary at this point. I do not have that available for review today although the patient did relay this to me. He also did obtain and have the ultrasound of the wound completed which I did sign off on today. It does appear that there is no fluid collection under the muscle this is likely then just edematous tissue in general. That is also good news. Overall I still believe the inflammation is the main issue here. He did inquire about the possibility of a wound VAC again with the muscle protruding like it is I am not really sure whether the wound VAC is necessarily ideal or not. That is something we will have to consider although I do believe he may need compression wrapping to  try to help with edema control which could potentially be of benefit. 06/28/2019 on evaluation today patient appears to be doing slightly better measurement wise although this is not terribly smaller he least seems to be trending towards that direction. With that being said he still seems to have purulent drainage noted in the wound bed at this time. He has been on Levaquin followed by Cipro over the past month. Unfortunately he still seems to have some issues with active infection at this time. I did perform a culture last week in order to evaluate and see if indeed there was still anything going on. Subsequently the culture did come back showing Pseudomonas which is consistent with the drainage has been having which is blue-green in color. He also has had an odor that again was somewhat consistent with Pseudomonas as well. Long story short it appears that the culture showed an intermediate finding with regard to how well the Cipro will work for the Pseudomonas infection. Subsequently being that he does not seem to be clearing up and at best what we are doing is just keeping this at [place] I think he may need to see infectious disease to discuss IV antibiotic options. TARENCE, SEARCY ([id number]) 07/05/2019 upon evaluation today patient appears to be doing okay in regard to his leg ulcer. He has been tolerating the dressing changes at this point without complication. Fortunately there is no signs of active infection at this time which is good news. No fevers, chills, nausea, vomiting, or diarrhea. With that being said he does have an appointment with infectious disease tomorrow and his primary care on Wednesday. Again the reason for the infectious disease referral was due to the fact that he did not seem to be fully resolving with the use of oral antibiotics and therefore we were thinking that IV antibiotic therapy may be necessary secondary to the fact that there was an intermediate finding for  how effective the Cipro may be. Nonetheless again he has been having a lot of purulent and even green drainage. Fortunately right now that seems to have calmed down over the past week with the reinitiation of the oral antibiotic. Nonetheless we will see what Dr. Megan Salon has to say. 07/12/2019 upon evaluation today patient appears to be  doing about the same at this point in regard to his left lower extremity ulcer. Fortunately there is no signs of active infection at this time which is good news I do believe the Levaquin has been beneficial I did review Dr. Hale Bogus note and to be honest I agree that the patient's leg does appear to be doing better currently. What we found in the past as he does not seem to really completely resolve he will stop the antibiotic and then subsequently things will revert back to having issues with blue-green drainage, increased pain, and overall worsening in general. Obviously that is the reason I sent him back to infectious disease. 07/19/2019 upon evaluation today patient appears to be doing roughly the same in size there is really no dramatic improvement. He has started back on the Levaquin at this point and though he seems to be doing okay he did still have a lot of blue/green drainage noted on evaluation today unfortunately. I think that this is still indicative more likely of a Pseudomonas infection as previously noted and again he does see Dr. Megan Salon in just a couple of days. I do not know that were really able to effectively clear this with just oral antibiotics alone based on what I am seeing currently. Nonetheless we are still continue to try to manage as best we can with regard to the patient and his wound. I do think the wrap was helpful in decreasing the edema which is excellent news. No fevers, chills, nausea, vomiting, or diarrhea. 07/26/2019 upon evaluation today patient appears to be doing slightly better with regard to the overall appearance of the muscle  there is no dark discoloration centrally. Fortunately there is no signs of active infection at this time. No fevers, chills, nausea, vomiting, or diarrhea. Patient's wound bed currently the patient did have an appointment with Dr. Megan Salon at infectious disease last week. With that being said Dr. Megan Salon the patient states was still somewhat hesitant about put him on any IV antibiotics he wanted [place] to repeat cultures today and then see where things go going forward. He does look like Dr. Megan Salon because of some improvement the patient did have with the Levaquin wanted [place] to see about repeating cultures. If it indeed grows the Pseudomonas again then he recommended a possibility of considering a PICC line placement and IV antibiotic therapy. He plans to see the patient back in 1 to 2 weeks. 08/02/2019 upon evaluation today patient appears to be doing poorly with regard to his left lower extremity. We did get the results of his culture back it shows that he is still showing evidence of Pseudomonas which is consistent with the purulent/blue-green drainage that he has currently. Subsequently the culture also shows that he now is showing resistance to the oral fluoroquinolones which is unfortunate as that was really the only thing to treat the infection prior. I do believe that he is looking like this is going require IV antibiotic therapy to get this under control. Fortunately there is no signs of systemic infection at this time which is good news. The patient does see Dr. Megan Salon tomorrow. 08/09/2019 upon evaluation today patient appears to be doing better with regard to his left lower extremity ulcer in regard to the overall appearance. He is currently on IV antibiotic therapy. As ordered by Dr. Megan Salon. Currently the patient is on ceftazidime which she is going to take for the next 2 weeks and then follow-up for 4 to 5-week appointment with Dr. Megan Salon.  The patient started this this past Friday  symptoms have not for a total of 3 days currently in full. 08/16/2019 upon evaluation today patient's wound actually does show muscle in the base of the wound but in general does appear to be much better as far as the overall evidence of infection is concerned. In fact I feel like this is for the most part cleared up he still on the IV antibiotics he has not completed the full course yet but I think he is doing much better which is excellent news. 08/23/2019 upon evaluation today patient appears to be doing about the same with regard to his wound at this point. He tells me that he still has pain unfortunately. Fortunately there is no evidence of systemic infection at this time which is great news. There is significant muscle protrusion. 09/13/19 upon evaluation today patient appears to be doing about the same in regard to his leg unfortunately. He still has a lot of drainage coming from the ulceration there is still muscle exposed. With that being said the patient's last wound culture still showed an intermediate finding with regard to the Pseudomonas he still having the bluish/green drainage as well. Overall I do not know that the wound has completely cleared of infection at this point. Fortunately there is no signs of active infection systemically at this point which is good news. 09/20/2019 upon evaluation today patient's wound actually appears to be doing about the same based on what I am seeing currently. I do not see any signs of systemic infection he still does have evidence of some local infection and drainage. He did see Dr. Megan Salon last week and Dr. Megan Salon states that he probably does need a different IV antibiotic although he does not want to put him on this until the patient begins the Remicade infusion which is actually scheduled for about 10 days out from today on 13 May. Following that time Dr. Megan Salon is good to see him back and then will evaluate the feasibility of starting him on the  IV antibiotic therapy once again at that point. I do not disagree with this plan I do believe as Dr. Megan Salon stated in his note that I reviewed today that the patient's issue is multifactorial with the pyoderma being 1 aspect of this that were hoping the Remicade will be helpful for her. In the meantime I think the gentamicin is, helping to keep things under decent okay control in regard to the ulcer. 09/27/2019 upon evaluation today patient appears to be doing about the same with regard to his wound still there is a lot of muscle exposure though he does have some hyper granulation tissue noted around the edge and actually some granulation tissue starting to form over the muscle which is actually good news. Fortunately there is no evidence of active infection which is also good news. His pain is less at this point. 5/21; this is a patient I have not seen in a long time. 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 He has a large wound on the left lateral leg with protruding muscle. He comes in the clinic today showing the same area on his left medial ankle. He says there is been a spot there for some time although we have not previously defined this. Today he has a clearly defined area with slight amount of skin breakdown surrounded by raised areas with a purplish hue in color. This is not painful he says it is irritated.  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 He still has muscle protruding from the lateral portion of his left leg but fortunately the new area that they were concerned about at his last visit does not appear to have opened at this point. He is currently on Remicade infusions and seems to be doing better in my opinion in fact the wound itself seems to be overall much better. The purplish discoloration that he did have seems to have resolved  and I think that is a good sign that hopefully the Remicade is doing its job. He does have some biofilm noted over the surface of the wound. 11/01/2019 on evaluation today patient's wound actually appears to be doing excellent at this time. Fortunately there is no evidence of active infection and overall I feel like he is making great progress. The Remicade seems to be due excellent job in my opinion. ANIELLO, CHRISTOPOULOS ([id number]) 11/08/19 evaluation today vision actually appears to be doing quite well with regard to his weight ulcer. He's been tolerating dressing changes without complication. Fortunately there is no evidence of infection. No fevers, chills, nausea, or vomiting noted at this time. Overall states that is having more itching than pain which is actually a good sign in my opinion. 12/13/2019 upon evaluation today patient appears to be doing well today with regard to his wound. He has been tolerating the dressing changes without complication. Fortunately there is no sign of active infection at this time. No fevers, chills, nausea, vomiting, or diarrhea. Overall I feel like the infusion therapy has been very beneficial for him. 01/06/2020 on evaluation today patient appears to be doing well with regard to his wound. This is measuring smaller and actually looks to be doing better. Fortunately there is no signs of active infection at this point. No fevers, chills, nausea, vomiting, or diarrhea. With that being said he does still have the blue-green drainage but this does not seem to be causing any significant issues currently. He has been using the gentamicin that does seem to be keeping things under decent control at this point. He goes later this morning for his next infusion therapy for the pyoderma which seems to also be very beneficial. 02/07/2020 on evaluation today patient appears to be doing about the same in regard to his wounds currently. Fortunately there is no signs of active infection  systemically he does still have evidence of local infection still using gentamicin. He also is showing some signs of improvement albeit slowly I do feel like we are making some progress here. 02/21/2020 upon evaluation today patient appears to be making some signs of improvement the wound is measuring a little bit smaller which is great news and overall I am very pleased with where he stands currently. He is going to be having infusion therapy treatment on the 15th of this month. Fortunately there is no signs of active infection at this time. 03/13/2020 I do believe patient's wound is actually showing some signs of improvement here which is great news. He has continue with the infusion therapy through rheumatology/dermatology at [facility]. That does seem to be beneficial. I still think he gets as much benefit from this as he did from the prednisone initially but nonetheless obviously this is less harsh on his body that the prednisone as far as they are concerned. 03/31/2020 on evaluation today patient's wound actually showing signs of some pretty good improvement in regard  to the overall appearance of the wound bed. There is still muscle exposed though he does have some epithelial growth around the edges of the wound. Fortunately there is no signs of active infection at this time. No fevers, chills, nausea, vomiting, or diarrhea. 04/24/2020 upon evaluation today patient appears to be doing about the same in regard to his leg ulcer. He has been tolerating the dressing changes without complication. Fortunately there is no signs of active infection at this time. No fevers, chills, nausea, vomiting, or diarrhea. With that being said he still has a lot of irritation from the bandaging around the edges of the wound. We did discuss today the possibility of a referral to plastic surgery. 05/22/2020 on evaluation today patient appears to be doing well with regard to his wounds all things considered. He has not been able  to get the Chantix apparently there is a recall nurse that I was unaware of put out by [employer] involuntarily. Nonetheless for now I am and I have to do some research into what may be the best option for him to help with quitting in regard to smoking and we discussed that today. 06/26/2020 upon evaluation today patient appears to be doing well with regard to his wound from the standpoint of infection I do not see any signs of infection at this point. With that being said unfortunately he is still continuing to have issues with muscle exposure and again he is not having a whole lot of new skin growth unfortunately. There does not appear to be any signs of active infection at this time. No fevers, chills, nausea, vomiting, or diarrhea. 07/10/2020 upon evaluation today patient appears to be doing a little bit more poorly currently compared to where he was previous. I am concerned currently about an active infection that may be getting worse especially in light of the increased size and tenderness of the wound bed. No fevers, chills, nausea, vomiting, or diarrhea. 07/24/2020 upon evaluation today patient appears to be doing poorly in regard to his leg ulcer. He has been tolerating the dressing changes without complication but unfortunately is having a lot of discomfort. 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 Subsequently I think he is going require possibly IV antibiotics to get this under control. I am very concerned about the severity of his infection and the amount of discomfort he is having. 07/31/2020 upon evaluation today patient appears to be doing about the same in regard to his leg wound. He did see Dr. Megan Salon and Dr. Megan Salon is actually going to start him on IV antibiotics. He goes for the PICC line tomorrow. With that being said there do not have that run for 2 weeks and then see how things are doing and depending on how he  is progressing they may extend that a little longer. Nonetheless I am glad this is getting ready to be in place and definitely feel it may help the patient. In the meantime is been using mainly triamcinolone to the wound bed has an anti-inflammatory. 08/07/2020 on evaluation today patient appears to be doing well with regard to his wound compared even last week. In the interim he has gotten the PICC line placed and overall this seems to be doing excellent. There does not appear to be any evidence of infection which is great news systemically although locally of course has had the infection this appears to be improving with the use of the antibiotics. 08/14/2020 upon  evaluation today patient's wound actually showing signs of excellent improvement. Overall the irritation has significantly improved the drainage is back down to more of a normal level and his pain is really pretty much nonexistent compared to what it was. Obviously I think that this is significantly improved secondary to the IV antibiotic therapy which has made all the difference in the world. Again he had a resistant form of Pseudomonas for which oral antibiotics just was not cutting it. Nonetheless I do think that still we need to consider the possibility of a surgical closure for this wound is been open so long and to be honest with muscle exposed I think this can be very hard to get this to close outside of this although definitely were still working to try to do what we can in that regard. 08/21/2020 upon evaluation today patient appears to be doing very well with regard to his wounds on the left lateral lower extremity/calf area. Fortunately there does not appear to be signs of active infection which is great news and overall very pleased with where things stand today. He is actually wrapping up his treatment with IV antibiotics tomorrow. After that we will see where things go from there. 08/28/2020 upon evaluation today patient appears  to be doing decently well with regard to his leg ulcer. There does not appear to be any signs of active infection which is great news and overall very pleased with where things stand today. No fevers, chills, nausea, vomiting, or diarrhea. 09/18/2020 upon evaluation today patient appears to be doing well with regard to his infection which I feel like is better. Unfortunately he is not doing as well with regard to the overall size of the wound which is not nearly as good at this point. I feel like that he may be having an issue here with the pyoderma being somewhat out of control. I think that he may benefit from potentially going back and talking to the dermatologist about [name], [name] ([id number]) what to do from the pyoderma standpoint. I am not certain if the infusions are helping nearly as much is what the prednisone did in the past. 10/02/2020 upon evaluation today patient appears to be doing well with regard to his leg ulcer. He did go to the Psychiatric nurse. Unfortunately they feel like there is a 10% chance that most that he would be able to heal and that the skin graft would take. Obviously this has led him to not be able to go down that path as far as treatment is concerned. Nonetheless he does seem to be doing a little bit better with the prednisone that I gave him last time. I think that he may need to discuss with dermatology the possibility of long-term prednisone as that seems to be what is most helpful for him to be perfectly honest. I am not sure the Remicade is really doing the job. Electronic Signature(s) Signed: 10/02/2020 10:02:04 AM By: Worthy Keeler PA-C Entered By: Worthy Keeler on 10/02/2020 10:02:04 [name] ([id number]) -------------------------------------------------------------------------------- Physical Exam Details Patient Name: [name] Date of Service: 10/02/2020 8:15 AM Medical Record Number: [id number] Patient Account Number: [id number] Date of  Birth/Sex: 19-May-1981 (39 y.o. M) Treating RN: Donnamarie Poag Primary Care Provider: [name] Other Clinician: Referring Provider: [name] Treating Provider/Extender: [name] Cliche in Treatment: 55 Constitutional Well-nourished and well-hydrated in no acute distress. Respiratory normal breathing without difficulty. Psychiatric this patient is able to make decisions and demonstrates good insight into disease  process. Alert and Oriented x 3. pleasant and cooperative. Notes Upon inspection patient's wound bed actually showed signs of good granulation epithelization at this point in some areas although he still has some irritation around the edges of the wound this is not nearly as bad as what it was 2 weeks ago when I saw him. Fortunately there is no sign of active infection at this time. Electronic Signature(s) Signed: 10/02/2020 10:02:21 AM By: Worthy Keeler PA-C Entered By: Worthy Keeler on 10/02/2020 10:02:21 [name] ([id number]) -------------------------------------------------------------------------------- Physician Orders Details Patient Name: [name] Date of Service: 10/02/2020 8:15 AM Medical Record Number: [id number] Patient Account Number: [id number] Date of Birth/Sex: August 25, 1980 (39 y.o. 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 Edema Control - Lymphedema / Segmental Compressive Device / Other o Tubigrip double layer applied - F Wound Treatment Wound #1 - Lower Leg Wound Laterality: Left, Lateral Cleanser: Soap and Water 1 x Per Day/30 Days Discharge Instructions: Gently cleanse wound with antibacterial soap, rinse and pat dry prior to dressing wounds Topical: Triamcinolone Acetonide Cream, 0.1%, 15 (g) tube 1 x Per Day/30 Days Discharge Instructions: to wound bed , lightly Secondary Dressing: Mepilex Border Flex, 6x6 (in/in) 1 x Per Day/30 Days Discharge Instructions: Apply to wound as directed. Do not cut. Add-Ons: tubi grip F (Generic) 1 x Per Day/30 Days Discharge Instructions: apply double layer to lower leg Patient Medications Allergies: milk, Biaxin, seasonal Notifications Medication Indication Start End triamcinolone acetonide 10/02/2020 DOSE topical 0.1 % ointment - ointment topical applied to the wound bed in a thin film with the dressing change each day Notes Pt stated he will make a follow up appt at his dermatologist Electronic Signature(s) Signed: 10/03/2020 3:13:06 PM By: Worthy Keeler PA-C Signed: 10/05/2020 8:26:01 AM By: Donnamarie Poag Previous Signature: 10/02/2020 10:04:01 AM Version By: Worthy Keeler PA-C Entered By: Donnamarie Poag on 10/03/2020 14:48:45 Bahl, [name] ([id number]) -------------------------------------------------------------------------------- Problem List Details Patient Name: [name] Date of Service: 10/02/2020 8:15 AM Medical Record Number: [id number] Patient Account Number: [id number] Date of Birth/Sex: [birth year] (39 y.o. 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 10/08/2019 No Yes I87.2 Venous insufficiency (chronic) (peripheral) 12/04/2016 No Yes L03.116 Cellulitis of left lower limb 05/24/2019 No Yes F17.208 Nicotine dependence, unspecified, with other nicotine-induced disorders 04/24/2020 No Yes Inactive Problems ICD-10 Code Description Active Date Inactive Date L97.213 Non-pressure chronic ulcer of right calf with necrosis of muscle 04/02/2017 04/02/2017 Resolved Problems Electronic Signature(s) Signed: 10/02/2020 8:22:27 AM By: Worthy Keeler PA-C Entered By: Worthy Keeler on 10/02/2020 08:22:27 [name] ([id number]) -------------------------------------------------------------------------------- Progress Note Details Patient Name: [name] Date of Service: 10/02/2020 8:15 AM Medical Record Number: [id number] Patient  Account Number: [id number] Date of Birth/Sex: 09-23-1980 (39 y.o. M) Treating RN: Donnamarie Poag Primary Care Provider: [name] Other Clinician: Referring Provider: [name] Treating Provider/Extender: [name] Cliche in Treatment: 199 Subjective Chief Complaint Information obtained from Patient He is here in follow up evaluation for LLE pyoderma ulcer History of Present Illness (HPI) 12/04/16; 41 year old man who comes into the clinic today for review of a wound on the posterior left calf. He tells me that is been there for about a year. He is not a diabetic he does smoke half a pack per day. He was seen in the ER on 11/20/16 felt to have cellulitis around the wound and was given clindamycin. An x-ray did not show osteomyelitis. The patient initially tells me that he has a milk allergy that sets off a pruritic itching rash on his lower legs which she scratches incessantly and he thinks that's what may have set up the wound. He has been using various topical antibiotics and  ointments without any effect. He works in a [employer] and is on his feet all day. He does not have a prior history of wounds however he does have the rash on both lower legs the right arm and the ventral aspect of his left arm. These are excoriations and clearly have had scratching however there are of macular looking areas on both legs including a substantial larger area on the right leg. This does not have an underlying open area. There is no blistering. The patient tells me that 2 years ago in [place] in response to the rash on his legs he saw a dermatologist who told him he had a condition which may be pyoderma gangrenosum although I may be putting words into his mouth. He seemed to recognize this. 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 He is not in any treatment for this. He's had no recent travel 12/11/16; the patient arrives today with his wound and roughly the same condition we've been using silver alginate this is a deep punched out wound with some surrounding erythema but no tenderness. Biopsy I did did not show confirmed pyoderma gangrenosum suggested nonspecific inflammation and vasculitis but does not provide an actual description of what was seen by the pathologist. I'm really not able to understand this We have also received information from the patient's dermatologist in [place] notes from April 2016. This was a doctor Agarwal-antal. 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 He was prescribed topical steroid high potency under occlusion which helped but at this point the patient did not have a deep punched out wound. 12/18/16; the patient's wound is larger in terms of surface area however this surface looks better and there is less depth. The surrounding erythema also is better. The patient states that the wrap we put on came off 2 days ago when he has been using his compression stockings. He we are in the process of getting a  dermatology consult. 12/26/16 on evaluation today patient's left lower extremity wound shows evidence of infection with surrounding erythema noted. He has been tolerating the dressing changes but states that he has noted more discomfort. There is a larger area of erythema surrounding the wound. No fevers, chills, nausea, or vomiting noted at this time. With that being said the wound still does have slough covering the surface. He is not allergic to any medication that he is aware of at this point. In regard to his right lower extremity  he had several regions that are erythematous and pruritic he wonders if there's anything we can do to help that. 01/02/17 I reviewed patient's wound culture which was obtained his visit last week. He was placed on doxycycline at that point. Unfortunately that does not appear to be an antibiotic that would likely help with the situation however the pseudomonas noted on culture is sensitive to Cipro. Also unfortunately patient's wound seems to have a large compared to last week's evaluation. Not severely so but there are definitely increased measurements in general. He is continuing to have discomfort as well he writes this to be a seven out of 10. In fact he would prefer me not to perform any debridement today due to the fact that he is having discomfort and considering he has an active infection on the little reluctant to do so anyway. No fevers, chills, nausea, or vomiting noted at this time. 01/08/17; patient seems dermatology on September 5. I suspect dermatology will want the slides from the biopsy I did sent to their pathologist. I'm not sure if there is a way we can expedite that. In any case the culture I did before I left on vacation 3 weeks ago showed Pseudomonas he was given 10 days of Cipro and per her description of her intake nurses is actually somewhat better this week although the wound is quite a bit bigger than I remember the last time I saw this. He still  has 3 more days of Cipro 01/21/17; dermatology appointment tomorrow. He has completed the ciprofloxacin for Pseudomonas. Surface of the wound looks better however he is had some deterioration in the lesions on his right leg. Meantime the left lateral leg wound we will continue with sample 01/29/17; patient had his dermatology appointment but I can't yet see that note. He is completed his antibiotics. The wound is more superficial but considerably larger in circumferential area than when he came in. This is in his left lateral calf. He also has swollen erythematous areas with superficial wounds on the right leg and small papular areas on both arms. There apparently areas in her his upper thighs and buttocks I did not look at those. Dermatology biopsied the right leg. Hopefully will have their input next week. 02/05/17; patient went back to see his dermatologist who told him that he had a "scratching problem" as well as staph. He is now on a 30 day course of doxycycline and I believe she gave him triamcinolone cream to the right leg areas to help with the itching [not exactly sure but probably triamcinolone]. She apparently looked at the left lateral leg wound although this was not rebiopsied and I think felt to be ultimately part of the same pathogenesis. He is using sample border foam and changing nevus himself. He now has a new open area on the right posterior leg which was his biopsy site I don't have any of the dermatology notes 02/12/17; we put the patient in compression last week with SANTYL to the wound on the left leg and the biopsy. Edema is much better and the depth of the wound is now at level of skin. Area is still the same Biopsy site on the right lateral leg we've also been using santyl with a border foam dressing and he is changing this himself. 02/19/17; Using silver alginate started last week to both the substantial left leg wound and the biopsy site on the right wound. He is  tolerating compression well. Has a an appointment with his  primary M.D. tomorrow wondering about diuretics although I'm wondering if the edema problem is actually lymphedema [name], [name] ([id number]) 02/26/17; the patient has been to see his primary doctor Dr. Jerrel Ivory at [facility] our primary care. She started him on Lasix 20 mg and this seems to have helped with the edema. However we are not making substantial change with the left lateral calf wound and inflammation. The biopsy site on the right leg also looks stable but not really all that different. 03/12/17; the patient has been to see vein and vascular Dr. Lucky Cowboy. He has had venous reflux studies I have not reviewed these. I did get a call from his dermatology office. 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 They therefore recommended clobetasol and tetracycline i.e. 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 03/26/17; apparently the patient just had reflux studies not an appointment with Dr. dew. She arrives in clinic today having applied clobetasol for 2-3 weeks. He notes over the last 2-3 days excessive drainage having to change the dressing 3-4 times a day and also expanding erythema. 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 Lillia Carmel at [facility] dermatology locally who kindly saw him at my request. 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 My  concern was the degree of erythema medially extending from his left leg wound which was either inflammation from pyoderma or cellulitis. I put him on Augmentin however culture of the wound showed Pseudomonas which is quinolone sensitive. I really don't believe he has cellulitis however in view of everything I will continue and give him a course of Cipro. He is also on doxycycline as an immune modulator for the pyoderma. In addition to his original wound on the left lateral leg with surrounding erythema he has a wound on the right posterior calf which was an original biopsy site done by dermatology. 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 Saw Dr. Lillia Carmel yesterday. He has been using topical antibiotics to both wound areas his original wound on the left and the biopsies/pathergy area on the right. There is definitely some improvement in the inflammation around the wound on the right although the patient states he has increasing sensitivity of the wounds. He is on prednisone 60 and doxycycline 1 as prescribed by Dr. Lillia Carmel. He is covering the topical antibiotic with gauze and putting this in his own compression stocks and changing this daily. He states that Dr. Lottie Rater did a culture of the left leg wound yesterday 05/07/17; pyoderma gangrenosum. The patient saw Dr. Lillia Carmel yesterday and has a follow-up with her in one month. He is still using topical antibiotics to both wounds although he can't recall exactly what type. He is still on prednisone 60 mg. Dr. Lillia Carmel stated that the doxycycline could stop if we were in agreement. 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 The right medial leg was induced by biopsy/pathergy. The area on the right is essentially healed. 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 The right medial leg has closed and remains closed. He is still on prednisone 60. He tells me he missed his last dermatology appointment with Dr. Lillia Carmel but will make another appointment. He reports that her blood sugar at a recent screen in [place] was high 200's. He was 180 today. 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 I think he is going to require still much longer prednisone perhaps another 3 months before attempting to taper. In the meantime his wound is a lot better. Smaller. He is cleaning this off daily and applying topical antibiotics. When he was last in the clinic [facility] and actually put in a couple of calls to dermatology although probably not during their business hours. 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 He continues on oral prednisone. He has been using triple antibiotic ointment. There is surface debris and we will transition to [facility] and have him return in 2 weeks. He has lost 30 pounds since his last appointment with lifestyle modification. He may benefit from topical steroid cream for treatment this can be considered at a later date. 08/22/17 on evaluation today patient appears to actually be doing rather well in regard to his left lateral lower extremity ulcer. He has actually been managed by Dr. Dellia Nims most recently. Patient is currently on oral steroids at this time. This seems to have been of benefit for him. Nonetheless his last visit was actually with Leah on 08/06/17. Currently he is not utilizing any topical steroid creams although this could be of benefit as well. No fevers, chills, nausea, or vomiting noted at this time. 09/05/17 on evaluation today patient appears to be doing better in regard to his left lateral lower  extremity ulcer. He has been tolerating the dressing changes without complication. He is using Santyl with good effect. Overall I'm very pleased with how things are standing at this point. Patient likewise is happy that this is doing better. 09/19/17 on evaluation today patient actually appears to be doing rather well in regard to his left lateral lower extremity ulcer. 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 He's not having any significant pain. 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 He has been tolerating the Santyl without complication and in general I feel like he's making good progress. 10/17/17 on evaluation today patient appears to be doing very well in regard to his left lateral lower surety ulcer. He has been tolerating the dressing changes without complication. There does not appear to be any evidence of infection he's alternating the Santyl and the triple antibiotic ointment every other day this seems to be doing well for him. 11/03/17 on evaluation today patient appears to be doing very well in regard to his left lateral lower extremity ulcer. He is been tolerating the dressing changes without complication which is good news. Fortunately there does not appear to be any evidence of infection which  is also great news. Overall is doing excellent they are starting to taper down on the prednisone is down to 40 mg at this point it also started topical clobetasol for him. 11/17/17 on evaluation today patient appears to be doing well in regard to his left lateral lower surety ulcer. He's been tolerating the dressing changes without complication. He does note that he is having no pain, no excessive drainage or discharge, and overall he feels like things are going about how he would expect and hope they would.  Overall he seems to have no evidence of infection at this time in my opinion which is good news. 12/04/17-He is seen in follow-up evaluation for right lateral lower extremity ulcer. He has been applying topical steroid cream. Today's measurement show slight increase in size. Over the next 2 weeks we will transition to every other day Santyl and steroid cream. He has been encouraged to monitor for changes and notify clinic with any concerns 12/15/17 on evaluation today patient's left lateral motion the ulcer and fortunately is doing worse again at this point. This just since last week to this week has close to doubled in size according to the patient. I did not seeing last week's I do not have a visual to compare this to in our system was also down so we do not have all the charts and at this point. Nonetheless it does have me somewhat concerned in regard to the fact that again he was worried enough about it he has contact the dermatology that placed them back on the full strength, 50 mg a day of the prednisone that he was taken previous. He continues to alternate using clobetasol along with Santyl at this point. He is obviously somewhat frustrated. [name], [name] ([id number]) 12/22/17 on evaluation today patient appears to be doing a little worse compared to last evaluation. Unfortunately the wound is a little deeper and slightly larger than the last week's evaluation. With that being said he has made some progress in regard to the irritation surrounding at this time unfortunately despite that progress that's been made he still has a significant issue going on here. 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 For that reason I discussed with him today the possibility of perform a wound culture to ensure there's nothing overtly infected. 01/06/18 on evaluation  today patient's wound is larger and deeper than previously evaluated. With that being said it did appear that his wound was infected after my last evaluation with him. Subsequently I did end up prescribing a prescription for Bactrim DS which she has been taking and having no complication with. Fortunately there does not appear to be any evidence of infection at this point in time as far as anything spreading, no want to touch, and overall I feel like things are showing signs of improvement. 01/13/18 on evaluation today patient appears to be even a little larger and deeper than last time. There still muscle exposed in the base of the wound. Nonetheless he does appear to be less erythematous I do believe inflammation is calming down also believe the infection looks like it's probably resolved at this time based on what I'm seeing. No fevers, chills, nausea, or vomiting noted at this time. 01/30/18 on evaluation today patient actually appears to visually look better for the most part. Unfortunately those visually this looks better he does seem  to potentially have what may be an abscess in the muscle that has been noted in the central portion of the wound. This is the first time that I have noted what appears to be fluctuance in the central portion of the muscle. With that being said I'm somewhat more concerned about the fact that this might indicate an abscess formation at this location. I do believe that an ultrasound would be appropriate. This is likely something we need to try to do as soon as possible. He has been switch to mupirocin ointment and he is no longer using the steroid ointment as prescribed by dermatology he sees them again next week he's been decreased from 60 to 40 mg of prednisone. 03/09/18 on evaluation today patient actually appears to be doing a little better compared to last time I saw him. There's not as much erythema surrounding the wound itself. He I did review his most recent  infectious disease note which was dated 02/24/18. He saw Dr. Michel Bickers in [place]. 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 Patient is now off of antibiotics and they are continually observing this. There seems to be no change in the past two weeks in my pinion based on what the patient says and what I see today compared to what Dr. Megan Salon likely saw two weeks ago. No fevers, chills, nausea, or vomiting noted at this time. 03/23/18 on evaluation today patient's wound actually appears to be showing signs of improvement which is good news. He is currently still on the Dapsone. He is also working on tapering the prednisone to get off of this and Dr. Lottie Rater is working with him in this regard. Nonetheless overall I feel like the wound is doing well it does appear based on the infectious disease note that I reviewed from Dr. 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 I did also review the results of his ultrasound of left lower extremity which revealed there was a dentist tissue in the base of the wound without an abscess noted. 04/06/18 on evaluation today the patient's left lateral lower extremity ulcer actually appears to be doing fairly well which is excellent news. There does not appear to be any evidence of infection at this time which is also great news. Overall he still does have a significantly large ulceration although little by little he seems to be making progress. He is down to 10 mg a day of the prednisone. 04/20/18 on evaluation today patient actually appears to be doing excellent at this time in regard to his left lower extremity ulcer. He's making signs of good progress unfortunately this is taking much longer than we would really like to see but nonetheless he is making  progress. Fortunately there does not appear to be any evidence of infection at this time. No fevers, chills, nausea, or vomiting noted at this time. The patient has not been using the Santyl due to the cost he hadn't got in this field yet. He's mainly been using the antibiotic ointment topically. Subsequently he also tells me that he really has not been scrubbing in the shower I think this would be helpful again as I told him it doesn't have to be anything too aggressive to even make it believe just enough to keep it free  of some of the loose slough and biofilm on the wound surface. 05/11/18 on evaluation today patient's wound appears to be making slow but sure progress in regard to the left lateral lower extremity ulcer. He is been tolerating the dressing changes without complication. Fortunately there does not appear to be any evidence of infection at this time. He is still just using triple antibiotic ointment along with clobetasol occasionally over the area. He never got the Santyl and really does not seem to intend to in my pinion. 06/01/18 on evaluation today patient appears to be doing a little better in regard to his left lateral lower extremity ulcer. He states that overall he does not feel like he is doing as well with the Dapsone as he did with the prednisone. Nonetheless he sees his dermatologist later today and is gonna talk to them about the possibility of going back on the prednisone. Overall again I believe that the wound would be better if you would utilize Santyl but he really does not seem to be interested in going back to the [place] at this point. He has been using triple antibiotic ointment. 06/15/18 on evaluation today patient's wound actually appears to be doing about the same at this point. Fortunately there is no signs of infection at this time. He has made slight improvements although he continues to not really want to clean the wound bed at this point. He states that he just  doesn't mess with it he doesn't want to cause any problems with everything else he has going on. He has been on medication, antibiotics as prescribed by his dermatologist, for a staff infection of his lower extremities which is really drying out now and looking much better he tells me. Fortunately there is no sign of overall infection. 06/29/18 on evaluation today patient appears to be doing well in regard to his left lateral lower surety ulcer all things considering. Fortunately his staff infection seems to be greatly improved compared to previous. He has no signs of infection and this is drying up quite nicely. He is still the doxycycline for this is no longer on cental, Dapsone, or any of the other medications. His dermatologist has recommended possibility of an infusion but right now he does not want to proceed with that. 07/13/18 on evaluation today patient appears to be doing about the same in regard to his left lateral lower surety ulcer. Fortunately there's no signs of infection at this time which is great news. Unfortunately he still builds up a significant amount of Slough/biofilm of the surface of the wound he still is not really cleaning this as he should be appropriately. Again I'm able to easily with saline and gauze remove the majority of this on the surface which if you would do this at home would likely be a dramatic improvement for him as far as getting the area to improve. Nonetheless overall I still feel like he is making progress is just very slow. I think Santyl will be of benefit for him as well. Still he has not gotten this as of this point. 07/27/18 on evaluation today patient actually appears to be doing little worse in regards of the erythema around the periwound region of the wound he also tells me that he's been having more drainage currently compared to what he was experiencing last time I saw him. He states not quite as bad as what he had because this was infected previously  but nonetheless is still appears to be doing poorly.  Fortunately there is no evidence of systemic infection at this point. The patient tells me that he is not going to be able to afford the Santyl. He is still waiting to hear about the infusion therapy with his dermatologist. Apparently she wants an updated colonoscopy first. [name], [name] ([id number]) 08/10/18 on evaluation today patient appears to be doing better in regard to his left lateral lower extremity ulcer. Fortunately he is showing signs of improvement in this regard he's actually been approved for Remicade infusion's as well although this has not been scheduled as of yet. Fortunately there's no signs of active infection at this time in regard to the wound although he is having some issues with infection of the right lower extremity is been seen as dermatologist for this. Fortunately they are definitely still working with him trying to keep things under control. 09/07/18 on evaluation today patient is actually doing rather well in regard to his left lateral lower extremity ulcer. He notes these actually having some hair grow back on his extremity which is something he has not seen in years. He also tells me that the pain is really not giving them any trouble at this time which is also good news overall she is very pleased with the progress he's using a combination of the mupirocin along with the probate is all mixed. 09/21/18 on evaluation today patient actually appears to be doing fairly well all things considered in regard to his looks from the ulcer. He's been tolerating the dressing changes without complication. Fortunately there's no signs of active infection at this time which is good news he is still on all antibiotics or prevention of the staff infection. He has been on prednisone for time although he states it is gonna contact his dermatologist and see if she put them on a short course due to some irritation that he has going on  currently. Fortunately there's no evidence of any overall worsening this is going very slow I think cental would be something that would be helpful for him although he states that $50 for tube is quite expensive. He therefore is not willing to get that at this point. 10/06/18 on evaluation today patient actually appears to be doing decently well in regard to his left lateral leg ulcer. He's been tolerating the dressing changes without complication. Fortunately there's no signs of active infection at this time. Overall I'm actually rather pleased with the progress he's making although it's slow he doesn't show any signs of infection and he does seem to be making some improvement. I do believe that he may need a switch up and dressings to try to help this to heal more appropriately and quickly. 10/19/18 on evaluation today patient actually appears to be doing better in regard to his left lateral lower extremity ulcer. This is shown signs of having much less Slough buildup at this point due to the fact he has been using the [employer]. Obviously this is very good news. The overall size of the wound is not dramatically smaller but again the appearance is. 11/02/18 on evaluation today patient actually appears to be doing quite well in regard to his lower [place] ulcer. A lot of the skin around the ulcer is actually somewhat irritating at this point this seems to be more due to the dressing causing irritation from the adhesive that anything else. Fortunately there is no signs of active infection at this time. 11/24/18 on evaluation today patient appears to be doing a little worse in regard  to his overall appearance of his lower extremity ulcer. There's more erythema and warmth around the wound unfortunately. He is currently on doxycycline which he has been on for some time. With that being said I'm not sure that seems to be helping with what appears to possibly be an acute cellulitis with regard to his left lower  extremity ulcer. No fevers, chills, nausea, or vomiting noted at this time. 12/08/18 on evaluation today patient's wounds actually appears to be doing significantly better compared to his last evaluation. He has been using Santyl along with alternating tripling about appointment as well as the steroid cream seems to be doing quite well and the wound is showing signs of improvement which is excellent news. Fortunately there's no evidence of infection and in fact his culture came back negative with only normal skin flora noted. 12/21/2018 upon evaluation today patient actually appears to be doing excellent with regard to his ulcer. This is actually the best that I have seen it since have been helping to take care of him. It is both smaller as well as less slough noted on the surface of the wound and seems to be showing signs of good improvement with new skin growing from the edges. He has been using just the triamcinolone he does wonder if he can get a refill of that ointment today. 01/04/2019 upon evaluation today patient actually appears to be doing well with regard to his left lateral lower extremity ulcer. With that being said it does not appear to be that he is doing quite as well as last time as far as progression is concerned. There does not appear to be any signs of infection or significant irritation which is good news. With that being said I do believe that he may benefit from switching to a collagen based dressing based on how clean The wound appears. 01/18/2019 on evaluation today patient actually appears to be doing well with regard to his wound on the left lower extremity. He is not made a lot of progress compared to where we were previous but nonetheless does seem to be doing okay at this time which is good news. There is no signs of active infection which is also good news. My only concern currently is I do wish we can get him into utilizing the collagen dressing his insurance would not pay  for the supplies that we ordered although it appears that he may be able to order this through his supply company that he typically utilizes. This is [employer]. Nonetheless he did try to order it during the office visit today and it appears this did go through. We will see if he can get that it is a different brand but nonetheless he has collagen and I do think will be beneficial. 02/01/2019 on evaluation today patient actually appears to be doing a little worse today in regard to the overall size of his wounds. Fortunately there is no signs of active infection at this time. That is visually. Nonetheless when this is happened before it was due to infection. For that reason were somewhat concerned about that this time as well. 02/08/2019 on evaluation today patient unfortunately appears to be doing slightly worse with regard to his wound upon evaluation today. Is measuring a little deeper and a little larger unfortunately. I am not really sure exactly what is causing this to enlarge he actually did see his dermatologist she is going to see about initiating Humira for him. Subsequently she also did do steroid  injections into the wound itself in the periphery. Nonetheless still nonetheless he seems to be getting a little bit larger he is gone back to just using the steroid cream topically which I think is appropriate. I would say hold off on the collagen for the time being is definitely a good thing to do. Based on the culture results which we finally did get the final result back regarding it shows staph as the bacteria noted again that can be a normal skin bacteria based on the fact however he is having increased drainage and worsening of the wound measurement wise I would go ahead and place him on an antibiotic today I do believe for this. 02/15/2019 on evaluation today patient actually appears to be doing somewhat better in regard to his ulcer. There is no signs of worsening at this time I did review his  culture results which showed evidence of Staphylococcus aureus but not MRSA. Again this could just be more related to the normal skin bacteria although he states the drainage has slowed down quite a bit he may have had a mild infection not just colonization. And was much smaller and then since around10/04/2019 on evaluation today patient appears to be doing unfortunately worse as far as the size of the wound. I really feel like that this is steadily getting larger again it had been doing excellent right at the beginning of September we have seen a steady increase in the area of the wound it is almost 2-1/2 times the size it was on September 1. Obviously this is a bad trend this is not wanting to see. For that reason we went back to using just the topical triamcinolone cream which does seem to help with inflammation. I checked him for bacteria by way of culture and nothing showed positive there. I am considering giving him a short course of a tapering steroid [place] Cilento, Alven ([id number]) today to see if that is can be beneficial for him. The patient is in agreement with giving that a try. 03/08/2019 on evaluation today patient appears to be doing very well in comparison to last evaluation with regard to his lower extremity ulcer. This is showing signs of less inflammation and actually measuring slightly smaller compared to last time every other week over the past month and a half he has been measuring larger larger larger. Nonetheless I do believe that the issue has been inflammation the prednisone does seem to have been beneficial for him which is good news. No fevers, chills, nausea, vomiting, or diarrhea. 03/22/2019 on evaluation today patient appears to be doing about the same with regard to his leg ulcer. He has been tolerating the dressing changes without complication. With that being said the wound seems to be mostly arrested at its current size but really is not making any progress except  for when we prescribed the prednisone. He did show some signs of dropping as far as the overall size of the wound during that interval week. Nonetheless this is something he is not on long-term at this point and unfortunately I think he is getting need either this or else the Humira which his dermatologist has discussed try to get approval for. With that being said he will be seeing his dermatologist on the 11th of this month that is November. 04/19/2019 on evaluation today patient appears to be doing really about the same the wound is measuring slightly larger compared to last time I saw him. He has not been into the office  since November 2 due to the fact that he unfortunately had Covid as that his entire family. He tells me that it was rough but they did pull-through and he seems to be doing much better. Fortunately there is no signs of active infection at this time. No fevers, chills, nausea, vomiting, or diarrhea. 05/10/2019 on evaluation today patient unfortunately appears to be doing significantly worse as compared to last time I saw him. He does tell me that he has had his first dose of Humira and actually is scheduled to get the next one in the upcoming week. With that being said he tells me also that in the past several days he has been having a lot of issues with green drainage she showed me a picture this is more blue-green in color. He is also been having issues with increased sloughy buildup and the wound does appear to be larger today. Obviously this is not the direction that we want everything to take based on the starting of his Humira. Nonetheless I think this is definitely a result of likely infection and to be honest I think this is probably Pseudomonas causing the infection based on what I am seeing. 05/24/2019 on evaluation today patient unfortunately appears to be doing significantly worse compared to his prior evaluation with me 2 weeks ago. I did review his culture results which  showed that he does have Staph aureus as well as Pseudomonas noted on the culture. Nonetheless the Levaquin that I prescribed for him does not appear to have been appropriate and in fact he tells me he is no longer experiencing the green drainage and discharge that he had at the last visit. Fortunately there is no signs of active infection at this time which is good news although the wound has significantly worsened it in fact is much deeper than it was previous. We have been utilizing up to this point triamcinolone ointment as the prescription topical of choice but at this time I really feel like that the wound is getting need to be packed in order to appropriately manage this due to the deeper nature of the wound. Therefore something along the lines of an alginate dressing may be more appropriate. 05/31/2019 upon inspection today patient's wound actually showed signs of doing poorly at this point. Unfortunately he just does not seem to be making any good progress despite what we have tried. He actually did go ahead and pick up the Cipro and start taking that as he was noticing more green drainage he had previously completed the Levaquin that I prescribed for him as well. Nonetheless he missed his appointment for the seventh last week on Wednesday with the wound care center and [facility] where his dermatologist referred him. Obviously I do think a second opinion would be helpful at this point especially in light of the fact that the patient seems to be doing so poorly despite the fact that we have tried everything that I really know how at this point. The only thing that ever seems to have helped him in the past is when he was on high doses of continual steroids that did seem to make a difference for him. Right now he is on immune modulating medication to try to help with the pyoderma but I am not sure that he is getting as much relief at this point as he is previously obtained from the use of  steroids. 06/07/2019 upon evaluation today patient unfortunately appears to be doing worse yet again  with regard to his wound. In fact I am starting to question whether or not he may have a fluid pocket in the muscle at this point based on the bulging and the soft appearance to the central portion of the muscle area. There is not anything draining from the muscle itself at this time which is good news but nonetheless the wound is expanding. I am not really seeing any results of the Humira as far as overall wound progression based on what I am seeing at this point. The patient has been referred for second opinion with regard to his wound to the [facility] wound care center by his dermatologist which I definitely am not in opposition to. Unfortunately we tried multiple dressings in the past including collagen, alginate, and at one point even Hydrofera Blue. With that being said he is never really used it for any significant amount of time due to the fact that he often complains of pain associated with these dressings and then will go back to either using the Santyl which she has done intermittently or more frequently the triamcinolone. He is also using his own compression stockings. We have wrapped him in the past but again that was something else that he really was not a big fan of. 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 He seemed to do much better when he was on the steroids although honestly I understand that the steroids are not necessarily the best medication to be on long-term obviously 06/14/2019 on evaluation today patient appears to be doing actually a little bit better with  regard to the overall appearance with his leg. Unfortunately he does continue to have issues with what appears to be some fluid underneath the muscle although he did see the wound specialty center at [facility] last week their main goals were to see about infusion therapy in place of the Humira as they feel like that is not quite strong enough. They also recommended that we continue with the treatment otherwise as we are they felt like that was appropriate and they are okay with him continuing to follow-up here with [place] in that regard. With that being said they are also sending him to the vein specialist there to see about vein stripping and if that would be of benefit for him. Subsequently they also did not really address whether or not an ultrasound of the muscle area to see if there is anything that needs to be addressed here would be appropriate or not. For that reason I discussed this with him last week I think we may proceed down that road at this point. 06/21/2019 upon evaluation today patient's wound actually appears to be doing slightly better compared to previous evaluations. I do believe that he has made a difference with regard to the progression here with the use of oral steroids. Again in the past has been the only thing that is really calm things down. He does tell me that from [facility] is gotten a good news from there that there are no further vein stripping that is necessary at this point. I do not have that available for review today although the  patient did relay this to me. He also did obtain and have the ultrasound of the wound completed which I did sign off on today. It does appear that there is no fluid collection under the muscle this is likely then just edematous tissue in general. That is also good news. Overall I still believe the inflammation is the main issue here. He did inquire about the possibility of a wound VAC again with the muscle protruding like it is I am not really sure whether  the wound VAC is necessarily ideal or not. That is something we will have to consider although I do believe he may need compression wrapping to try to help with edema control which could potentially be of benefit. 06/28/2019 on evaluation today patient appears to be doing slightly better measurement wise although this is not terribly smaller he least seems to be trending towards that direction. With that being said he still seems to have purulent drainage noted in the wound bed at this time. He has been on Levaquin followed by Cipro over the past month. Unfortunately he still seems to have some issues with active infection at this time. I did perform a culture last week in order to evaluate and see if indeed there was still anything going on. Subsequently the culture did come back [name], [name] ([id number]) showing Pseudomonas which is consistent with the drainage has been having which is blue-green in color. He also has had an odor that again was somewhat consistent with Pseudomonas as well. Long story short it appears that the culture showed an intermediate finding with regard to how well the Cipro will work for the Pseudomonas infection. Subsequently being that he does not seem to be clearing up and at best what we are doing is just keeping this at [place] I think he may need to see infectious disease to discuss IV antibiotic options. 07/05/2019 upon evaluation today patient appears to be doing okay in regard to his leg ulcer. He has been tolerating the dressing changes at this point without complication. Fortunately there is no signs of active infection at this time which is good news. No fevers, chills, nausea, vomiting, or diarrhea. With that being said he does have an appointment with infectious disease tomorrow and his primary care on Wednesday. Again the reason for the infectious disease referral was due to the fact that he did not seem to be fully resolving with the use of oral antibiotics  and therefore we were thinking that IV antibiotic therapy may be necessary secondary to the fact that there was an intermediate finding for how effective the Cipro may be. Nonetheless again he has been having a lot of purulent and even green drainage. Fortunately right now that seems to have calmed down over the past week with the reinitiation of the oral antibiotic. Nonetheless we will see what Dr. Megan Salon has to say. 07/12/2019 upon evaluation today patient appears to be doing about the same at this point in regard to his left lower extremity ulcer. Fortunately there is no signs of active infection at this time which is good news I do believe the Levaquin has been beneficial I did review Dr. Hale Bogus note and to be honest I agree that the patient's leg does appear to be doing better currently. What we found in the past as he does not seem to really completely resolve he will stop the antibiotic and then subsequently things will revert back to having issues with blue-green drainage, increased pain, and overall worsening  in general. Obviously that is the reason I sent him back to infectious disease. 07/19/2019 upon evaluation today patient appears to be doing roughly the same in size there is really no dramatic improvement. He has started back on the Levaquin at this point and though he seems to be doing okay he did still have a lot of blue/green drainage noted on evaluation today unfortunately. I think that this is still indicative more likely of a Pseudomonas infection as previously noted and again he does see Dr. Megan Salon in just a couple of days. I do not know that were really able to effectively clear this with just oral antibiotics alone based on what I am seeing currently. Nonetheless we are still continue to try to manage as best we can with regard to the patient and his wound. I do think the wrap was helpful in decreasing the edema which is excellent news. No fevers, chills, nausea, vomiting,  or diarrhea. 07/26/2019 upon evaluation today patient appears to be doing slightly better with regard to the overall appearance of the muscle there is no dark discoloration centrally. Fortunately there is no signs of active infection at this time. No fevers, chills, nausea, vomiting, or diarrhea. Patient's wound bed currently the patient did have an appointment with Dr. Megan Salon at infectious disease last week. With that being said Dr. Megan Salon the patient states was still somewhat hesitant about put him on any IV antibiotics he wanted [place] to repeat cultures today and then see where things go going forward. He does look like Dr. Megan Salon because of some improvement the patient did have with the Levaquin wanted [place] to see about repeating cultures. If it indeed grows the Pseudomonas again then he recommended a possibility of considering a PICC line placement and IV antibiotic therapy. He plans to see the patient back in 1 to 2 weeks. 08/02/2019 upon evaluation today patient appears to be doing poorly with regard to his left lower extremity. We did get the results of his culture back it shows that he is still showing evidence of Pseudomonas which is consistent with the purulent/blue-green drainage that he has currently. Subsequently the culture also shows that he now is showing resistance to the oral fluoroquinolones which is unfortunate as that was really the only thing to treat the infection prior. I do believe that he is looking like this is going require IV antibiotic therapy to get this under control. Fortunately there is no signs of systemic infection at this time which is good news. The patient does see Dr. Megan Salon tomorrow. 08/09/2019 upon evaluation today patient appears to be doing better with regard to his left lower extremity ulcer in regard to the overall appearance. He is currently on IV antibiotic therapy. As ordered by Dr. Megan Salon. Currently the patient is on ceftazidime which she is going  to take for the next 2 weeks and then follow-up for 4 to 5-week appointment with Dr. Megan Salon. The patient started this this past Friday symptoms have not for a total of 3 days currently in full. 08/16/2019 upon evaluation today patient's wound actually does show muscle in the base of the wound but in general does appear to be much better as far as the overall evidence of infection is concerned. In fact I feel like this is for the most part cleared up he still on the IV antibiotics he has not completed the full course yet but I think he is doing much better which is excellent news. 08/23/2019 upon evaluation today patient  appears to be doing about the same with regard to his wound at this point. He tells me that he still has pain unfortunately. Fortunately there is no evidence of systemic infection at this time which is great news. There is significant muscle protrusion. 09/13/19 upon evaluation today patient appears to be doing about the same in regard to his leg unfortunately. He still has a lot of drainage coming from the ulceration there is still muscle exposed. With that being said the patient's last wound culture still showed an intermediate finding with regard to the Pseudomonas he still having the bluish/green drainage as well. Overall I do not know that the wound has completely cleared of infection at this point. Fortunately there is no signs of active infection systemically at this point which is good news. 09/20/2019 upon evaluation today patient's wound actually appears to be doing about the same based on what I am seeing currently. I do not see any signs of systemic infection he still does have evidence of some local infection and drainage. He did see Dr. Megan Salon last week and Dr. Megan Salon states that he probably does need a different IV antibiotic although he does not want to put him on this until the patient begins the Remicade infusion which is actually scheduled for about 10 days out from  today on 13 May. Following that time Dr. Megan Salon is good to see him back and then will evaluate the feasibility of starting him on the IV antibiotic therapy once again at that point. I do not disagree with this plan I do believe as Dr. Megan Salon stated in his note that I reviewed today that the patient's issue is multifactorial with the pyoderma being 1 aspect of this that were hoping the Remicade will be helpful for her. In the meantime I think the gentamicin is, helping to keep things under decent okay control in regard to the ulcer. 09/27/2019 upon evaluation today patient appears to be doing about the same with regard to his wound still there is a lot of muscle exposure though he does have some hyper granulation tissue noted around the edge and actually some granulation tissue starting to form over the muscle which is actually good news. Fortunately there is no evidence of active infection which is also good news. His pain is less at this point. 5/21; this is a patient I have not seen in a long time. 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 He has a large wound on the left lateral leg with protruding muscle. He comes in the clinic today showing the same area on his left medial ankle. He says there is been a spot there for some time although we have not previously defined this. Today he has a clearly defined area with slight amount of skin breakdown surrounded by raised areas with a purplish hue in color. This is not painful he says it is irritated. 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 He still has muscle protruding from the lateral portion of his left leg but fortunately the new area that they were concerned about at his last visit does not appear to have opened at this point. He is currently on Remicade infusions and seems to be doing better in  my opinion in fact the wound itself seems to be overall much better. The purplish discoloration that he did have seems to have resolved and I think  that is a good sign that hopefully the Remicade is doing its job. He does [name], [name] ([id number]) have some biofilm noted over the surface of the wound. 11/01/2019 on evaluation today patient's wound actually appears to be doing excellent at this time. Fortunately there is no evidence of active infection and overall I feel like he is making great progress. The Remicade seems to be due excellent job in my opinion. 11/08/19 evaluation today vision actually appears to be doing quite well with regard to his weight ulcer. He's been tolerating dressing changes without complication. Fortunately there is no evidence of infection. No fevers, chills, nausea, or vomiting noted at this time. Overall states that is having more itching than pain which is actually a good sign in my opinion. 12/13/2019 upon evaluation today patient appears to be doing well today with regard to his wound. He has been tolerating the dressing changes without complication. Fortunately there is no sign of active infection at this time. No fevers, chills, nausea, vomiting, or diarrhea. Overall I feel like the infusion therapy has been very beneficial for him. 01/06/2020 on evaluation today patient appears to be doing well with regard to his wound. This is measuring smaller and actually looks to be doing better. Fortunately there is no signs of active infection at this point. No fevers, chills, nausea, vomiting, or diarrhea. With that being said he does still have the blue-green drainage but this does not seem to be causing any significant issues currently. He has been using the gentamicin that does seem to be keeping things under decent control at this point. He goes later this morning for his next infusion therapy for the pyoderma which seems to also be very beneficial. 02/07/2020 on evaluation  today patient appears to be doing about the same in regard to his wounds currently. Fortunately there is no signs of active infection systemically he does still have evidence of local infection still using gentamicin. He also is showing some signs of improvement albeit slowly I do feel like we are making some progress here. 02/21/2020 upon evaluation today patient appears to be making some signs of improvement the wound is measuring a little bit smaller which is great news and overall I am very pleased with where he stands currently. He is going to be having infusion therapy treatment on the 15th of this month. Fortunately there is no signs of active infection at this time. 03/13/2020 I do believe patient's wound is actually showing some signs of improvement here which is great news. He has continue with the infusion therapy through rheumatology/dermatology at [facility]. That does seem to be beneficial. I still think he gets as much benefit from this as he did from the prednisone initially but nonetheless obviously this is less harsh on his body that the prednisone as far as they are concerned. 03/31/2020 on evaluation today patient's wound actually showing signs of some pretty good improvement in regard to the overall appearance of the wound bed. There is still muscle exposed though he does have some epithelial growth around the edges of the wound. Fortunately there is no signs of active infection at this time. No fevers, chills, nausea, vomiting, or diarrhea. 04/24/2020 upon evaluation today patient appears to be doing about the same in regard to his leg ulcer. He has been tolerating the dressing changes without complication. Fortunately there is no signs of active infection at this time. No fevers, chills, nausea, vomiting, or diarrhea. With that being said he still has a lot of  irritation from the bandaging around the edges of the wound. We did discuss today the possibility of a referral to plastic  surgery. 05/22/2020 on evaluation today patient appears to be doing well with regard to his wounds all things considered. He has not been able to get the Chantix apparently there is a recall nurse that I was unaware of put out by [employer] involuntarily. Nonetheless for now I am and I have to do some research into what may be the best option for him to help with quitting in regard to smoking and we discussed that today. 06/26/2020 upon evaluation today patient appears to be doing well with regard to his wound from the standpoint of infection I do not see any signs of infection at this point. With that being said unfortunately he is still continuing to have issues with muscle exposure and again he is not having a whole lot of new skin growth unfortunately. There does not appear to be any signs of active infection at this time. No fevers, chills, nausea, vomiting, or diarrhea. 07/10/2020 upon evaluation today patient appears to be doing a little bit more poorly currently compared to where he was previous. I am concerned currently about an active infection that may be getting worse especially in light of the increased size and tenderness of the wound bed. No fevers, chills, nausea, vomiting, or diarrhea. 07/24/2020 upon evaluation today patient appears to be doing poorly in regard to his leg ulcer. He has been tolerating the dressing changes without complication but unfortunately is having a lot of discomfort. 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 Subsequently I think he is going require possibly IV antibiotics to get this under control. I am very concerned about the severity of his infection and the amount of discomfort he is having. 07/31/2020 upon evaluation today patient appears to be doing about the same in regard to his leg wound. He did see Dr. Megan Salon and Dr. Megan Salon is actually going to start him on IV antibiotics. He goes for  the PICC line tomorrow. With that being said there do not have that run for 2 weeks and then see how things are doing and depending on how he is progressing they may extend that a little longer. Nonetheless I am glad this is getting ready to be in place and definitely feel it may help the patient. In the meantime is been using mainly triamcinolone to the wound bed has an anti-inflammatory. 08/07/2020 on evaluation today patient appears to be doing well with regard to his wound compared even last week. In the interim he has gotten the PICC line placed and overall this seems to be doing excellent. There does not appear to be any evidence of infection which is great news systemically although locally of course has had the infection this appears to be improving with the use of the antibiotics. 08/14/2020 upon evaluation today patient's wound actually showing signs of excellent improvement. Overall the irritation has significantly improved the drainage is back down to more of a normal level and his pain is really pretty much nonexistent compared to what it was. Obviously I think that this is significantly improved secondary to the IV antibiotic therapy which has made all the difference in the world. Again he had a resistant form of Pseudomonas for which oral antibiotics just was not cutting it. Nonetheless I do think that still we need to consider the possibility of a surgical closure for this wound  is been open so long and to be honest with muscle exposed I think this can be very hard to get this to close outside of this although definitely were still working to try to do what we can in that regard. 08/21/2020 upon evaluation today patient appears to be doing very well with regard to his wounds on the left lateral lower extremity/calf area. Fortunately there does not appear to be signs of active infection which is great news and overall very pleased with where things stand today. He is actually wrapping up  his treatment with IV antibiotics tomorrow. After that we will see where things go from there. 08/28/2020 upon evaluation today patient appears to be doing decently well with regard to his leg ulcer. There does not appear to be any signs of [name], Jaedon ([id number]) active infection which is great news and overall very pleased with where things stand today. No fevers, chills, nausea, vomiting, or diarrhea. 09/18/2020 upon evaluation today patient appears to be doing well with regard to his infection which I feel like is better. Unfortunately he is not doing as well with regard to the overall size of the wound which is not nearly as good at this point. I feel like that he may be having an issue here with the pyoderma being somewhat out of control. I think that he may benefit from potentially going back and talking to the dermatologist about what to do from the pyoderma standpoint. I am not certain if the infusions are helping nearly as much is what the prednisone did in the past. 10/02/2020 upon evaluation today patient appears to be doing well with regard to his leg ulcer. He did go to the Psychiatric nurse. Unfortunately they feel like there is a 10% chance that most that he would be able to heal and that the skin graft would take. Obviously this has led him to not be able to go down that path as far as treatment is concerned. Nonetheless he does seem to be doing a little bit better with the prednisone that I gave him last time. I think that he may need to discuss with dermatology the possibility of long-term prednisone as that seems to be what is most helpful for him to be perfectly honest. I am not sure the Remicade is really doing the job. Objective Constitutional Well-nourished and well-hydrated in no acute distress. Vitals Time Taken: 8:26 AM, Height: 71 in, Weight: 338 lbs, BMI: 47.1, Temperature: 98.3 F, Pulse: 82 bpm, Respiratory Rate: 16 breaths/min, Blood Pressure: 116/77  mmHg. Respiratory normal breathing without difficulty. Psychiatric this patient is able to make decisions and demonstrates good insight into disease process. Alert and Oriented x 3. pleasant and cooperative. General Notes: Upon inspection patient's wound bed actually showed signs of good granulation epithelization at this point in some areas although he still has some irritation around the edges of the wound this is not nearly as bad as what it was 2 weeks ago when I saw him. Fortunately there is no sign of active infection at this time. Integumentary (Hair, Skin) Wound #1 status is Open. Original cause of wound was Gradually Appeared. The date acquired was: 11/18/2015. The wound has been in treatment 199 weeks. The wound is located on the Left,Lateral Lower Leg. The wound measures 6.2cm length x 7cm width x 1cm depth; 34.086cm^2 area and 34.086cm^3 volume. There is muscle and Fat Layer (Subcutaneous Tissue) exposed. There is no tunneling or undermining noted. There is a medium amount of serosanguineous  drainage noted. The wound margin is epibole. There is medium (34-66%) red, pink granulation within the wound bed. There is a medium (34-66%) amount of necrotic tissue within the wound bed including Adherent Slough. 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 Edema Control - Lymphedema / Segmental Compressive Device / Other: Tubigrip double layer applied - F Consults ordered were: DIONYSIOS, MASSMAN ([id number]) Dermatology - Dermatology consult recommended-pt will make appt The following medication(s) was prescribed: triamcinolone acetonide topical  0.1 % ointment ointment topical applied to the wound bed in a thin film with the dressing change each day starting 10/02/2020 WOUND #1: - Lower Leg Wound Laterality: Left, Lateral Cleanser: Soap and Water 1 x Per Day/30 Days Discharge Instructions: Gently cleanse wound with antibacterial soap, rinse and pat dry prior to dressing wounds Topical: Triamcinolone Acetonide Cream, 0.1%, 15 (g) tube 1 x Per Day/30 Days Discharge Instructions: to wound bed , lightly Secondary Dressing: Mepilex Border Flex, 6x6 (in/in) 1 x Per Day/30 Days Discharge Instructions: Apply to wound as directed. Do not cut. Add-Ons: tubi grip F (Generic) 1 x Per Day/30 Days Discharge Instructions: apply double layer to lower leg 1. Would recommend currently that we going continue with the wound care measures as before and the patient is in agreement with the plan. This includes the use of the triamcinolone to the wound bed. I am going to send in a refill that today. He just supposed to use a thin film of this. 2. I am also can recommend that he continue with the border foam dressing to cover. 3. Also discussed with him the possibility of getting compression socks. Again that something that we have discussed before he can get dual layer compression socks from [facility] which would give him 30-40 mm of compression. With that being said he has not called about that since we saw him 2 weeks ago and told him about it. I think that is something he should look into. 4. Also discussed with him today the possibility of going back to talk to dermatology at [facility] and see if there is anything else they can recommend obviously the Remicade seems to not be as effective as we would like to see. Also I feel like that the prednisone in the past was much more effective though I understand completely that it is also not necessarily the optimal way to go when it comes to long-term therapy. Still neither is keeping a wound on the leg for this  amount of time. We will see patient back for reevaluation in 2 weeks here in the clinic. If anything worsens or changes patient will contact our office for additional recommendations. Electronic Signature(s) Signed: 10/02/2020 10:04:41 AM By: Worthy Keeler PA-C Entered By: Worthy Keeler on 10/02/2020 10:04:41 [name] ([id number]) -------------------------------------------------------------------------------- SuperBill Details Patient Name: [name] Date of Service: 10/02/2020 Medical Record Number: [id number] Patient Account Number: [id number] Date of Birth/Sex: Mar 19, 1981 (40 y.o. M) Treating RN: Donnamarie Poag Primary Care Provider: [name] Other Clinician: Referring Provider: [name] Treating Provider/Extender: [name] Cliche in Treatment: 199 Diagnosis Coding ICD-10 Codes Code Description [phone] Non-pressure chronic ulcer of left calf with fat layer exposed L88 Pyoderma gangrenosum L97.321 Non-pressure chronic ulcer of left ankle limited to breakdown of  skin I87.2 Venous insufficiency (chronic) (peripheral) L03.116 Cellulitis of left lower limb F17.208 Nicotine dependence, unspecified, with other nicotine-induced disorders Facility Procedures CPT4 Code: [id number] Description: [phone] - WOUND CARE VISIT-LEV 2 EST PT Modifier: Quantity: 1 Physician Procedures CPT4 Code: [id number] Description: 92780 - WC PHYS LEVEL 4 - EST PT Modifier: Quantity: 1 CPT4 Code: Description: ICD-10 Diagnosis Description L97.222 Non-pressure chronic ulcer of left calf with fat layer exposed L88 Pyoderma gangrenosum L97.321 Non-pressure chronic ulcer of left ankle limited to breakdown of skin I87.2 Venous insufficiency (chronic)  (peripheral) Modifier: Quantity: Electronic Signature(s) Signed: 10/02/2020 10:05:01 AM By: Worthy Keeler PA-C Entered By: Worthy Keeler on 10/02/2020 10:05:00

## 2020-10-09 ENCOUNTER — Other Ambulatory Visit: Payer: 59

## 2020-10-14 ENCOUNTER — Other Ambulatory Visit: Payer: Self-pay | Admitting: Primary Care

## 2020-10-14 DIAGNOSIS — E119 Type 2 diabetes mellitus without complications: Secondary | ICD-10-CM

## 2020-10-17 ENCOUNTER — Encounter: Payer: 59 | Admitting: Physician Assistant

## 2020-10-17 ENCOUNTER — Other Ambulatory Visit: Payer: Self-pay

## 2020-10-17 DIAGNOSIS — L97321 Non-pressure chronic ulcer of left ankle limited to breakdown of skin: Secondary | ICD-10-CM | POA: Diagnosis not present

## 2020-10-17 NOTE — Progress Notes (Addendum)
KAMARE, CASPERS ([id number]) Visit Report for 10/17/2020 Chief Complaint Document Details Patient Name: [name], [name] Date of Service: 10/17/2020 8:15 AM Medical Record Number: [id number] Patient Account Number: [id number] Date of Birth/Sex: [birth year] (40 y.o. 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 M) Treating RN: Dolan Amen Primary Care Provider: [name] Other Clinician: Referring Provider: [name] Treating Provider/Extender: [name] Cliche in Treatment: 201 History of Present Illness HPI Description: 12/04/16; 40 year old man who comes into the clinic today for review of a wound on the posterior left calf. He tells me that is been there for about a year. He is not a diabetic he does smoke half a pack per day. He was seen in the ER on 11/20/16 felt to have cellulitis around the wound and was given clindamycin. An x-ray did not show osteomyelitis. The patient initially tells me that he has a milk allergy that sets off a pruritic itching rash on his lower legs which she scratches incessantly and he thinks that's what may have set up the wound. He has been using various topical antibiotics and ointments without any effect. He works in a [employer] and is on his feet all day. He does not have a  prior history of wounds however he does have the rash on both lower legs the right arm and the ventral aspect of his left arm. These are excoriations and clearly have had scratching however there are of macular looking areas on both legs including a substantial larger area on the right leg. This does not have an underlying open area. There is no blistering. The patient tells me that 2 years ago in [place] in response to the rash on his legs he saw a dermatologist who told him he had a condition which may be pyoderma gangrenosum although I may be putting words into his mouth. He seemed to recognize this. 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 He is not in any treatment for this. He's had no recent travel 12/11/16; the patient arrives today with his wound and roughly the same condition we've been using silver alginate this is a deep punched out wound with some surrounding erythema but no tenderness. Biopsy I did did not show confirmed pyoderma gangrenosum suggested nonspecific inflammation and vasculitis but does not provide an actual description of what was seen by the pathologist. I'm really not able to understand this We have also received information from the patient's dermatologist in [place] notes from April 2016. This was a doctor Agarwal-antal. 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 He was prescribed topical steroid high potency under occlusion which helped but at this point the patient did not have a deep punched out wound. 12/18/16; the patient's wound is larger in terms of surface area however this surface looks better and there is less depth. The surrounding erythema also is better. The patient states that the wrap we put on came off 2 days ago when he has been using his compression stockings.  He we are in the process of getting a dermatology consult. 12/26/16 on evaluation today patient's left lower extremity wound shows evidence of  infection with surrounding erythema noted. He has been tolerating the dressing changes but states that he has noted more discomfort. There is a larger area of erythema surrounding the wound. No fevers, chills, nausea, or vomiting noted at this time. With that being said the wound still does have slough covering the surface. He is not allergic to any medication that he is aware of at this point. In regard to his right lower extremity he had several regions that are erythematous and pruritic he wonders if there's anything we can do to help that. 01/02/17 I reviewed patient's wound culture which was obtained his visit last week. He was placed on doxycycline at that point. Unfortunately that does not appear to be an antibiotic that would likely help with the situation however the pseudomonas noted on culture is sensitive to Cipro. Also unfortunately patient's wound seems to have a large compared to last week's evaluation. Not severely so but there are definitely increased measurements in general. He is continuing to have discomfort as well he writes this to be a seven out of 10. In fact he would prefer me not to perform any debridement today due to the fact that he is having discomfort and considering he has an active infection on the little reluctant to do so anyway. No fevers, chills, nausea, or vomiting noted at this time. 01/08/17; patient seems dermatology on September 5. I suspect dermatology will want the slides from the biopsy I did sent to their pathologist. I'm not sure if there is a way we can expedite that. In any case the culture I did before I left on vacation 3 weeks ago showed Pseudomonas he was given 10 days of Cipro and per her description of her intake nurses is actually somewhat better this week although the wound is quite a bit bigger than I remember the last time I saw this. He still has 3 more days of Cipro 01/21/17; dermatology appointment tomorrow. He has completed the ciprofloxacin  for Pseudomonas. Surface of the wound looks better however he is had some deterioration in the lesions on his right leg. Meantime the left lateral leg wound we will continue with sample 01/29/17; patient had his dermatology appointment but I can't yet see that note. He is completed his antibiotics. The wound is more superficial but considerably larger in circumferential area than when he came in. This is in his left lateral calf. He also has swollen erythematous areas with superficial wounds on the right leg and small papular areas on both arms. There apparently areas in her his upper thighs and buttocks I did not look at those. Dermatology biopsied the right leg. Hopefully will have their input next week. 02/05/17; patient went back to see his dermatologist who told him that he had a "scratching problem" as well as staph. He is now on a 30 day course of doxycycline and I believe she gave him triamcinolone cream to the right leg areas to help with the itching [not exactly sure but probably triamcinolone]. She apparently looked at the left lateral leg wound although this was not rebiopsied and I think felt to be ultimately part of the same pathogenesis. He is using sample border foam and changing nevus himself. He now has a new open area on the right posterior leg which was his biopsy site I don't have any of the dermatology notes  02/12/17; we put the patient in compression last week with SANTYL to the wound on the left leg and the biopsy. Edema is much better and the depth of the wound is now at level of skin. Area is still the same oBiopsy site on the right lateral leg we've also been using santyl with a border foam dressing and he is changing this himself. 02/19/17; Using silver alginate started last week to both the substantial left leg wound and the biopsy site on the right wound. He is tolerating compression well. Has a an appointment with his primary M.D. tomorrow wondering about diuretics although  I'm wondering if the edema problem is actually lymphedema 02/26/17; the patient has been to see his primary doctor Dr. Jerrel Ivory at [facility] our primary care. She started him on Lasix 20 mg and this seems to have helped with the edema. However we are not making substantial change with the left lateral calf wound and inflammation. The biopsy site on the right leg also looks stable but not really all that different. 03/12/17; the patient has been to see vein and vascular Dr. Lucky Cowboy. He has had venous reflux studies I have not reviewed these. I did get a call from his dermatology office. They felt that he might have pathergy based on their biopsy on his right leg which led them to look at the slides of [name], [name] ([id number]) the biopsy I did on the left leg and they wonder whether this represents pyoderma gangrenosum which was the original supposition in a man with ulcerative colitis albeit inactive for many years. They therefore recommended clobetasol and tetracycline i.e. 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 03/26/17; apparently the patient just had reflux studies not an appointment with Dr. dew. She arrives in clinic today having applied clobetasol for 2-3 weeks. He notes over the last 2-3 days excessive drainage having to change the dressing 3-4 times a day and also expanding erythema. 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 Lillia Carmel at [facility] dermatology locally who kindly saw him at my request. 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 My concern was the degree of erythema medially extending from his left leg wound which was either inflammation from pyoderma  or cellulitis. I put him on Augmentin however culture of the wound showed Pseudomonas which is quinolone sensitive. I really don't believe he has cellulitis however in view of everything I will continue and give him a course of Cipro. He is also on doxycycline as an immune modulator for the pyoderma. In addition to his original wound on the left lateral leg with surrounding erythema he has a wound on the right posterior calf which was an original biopsy site done by dermatology. 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 Saw Dr. Lillia Carmel yesterday. He has been using topical antibiotics to both wound areas his original wound on the left and the biopsies/pathergy area on the right. There is definitely some improvement in the inflammation around the wound on the right although the patient states he has increasing sensitivity of the wounds. He is on prednisone 60 and doxycycline 1 as prescribed by Dr. Lillia Carmel. He is covering the topical antibiotic with gauze  and putting this in his own compression stocks and changing this daily. He states that Dr. Lottie Rater did a culture of the left leg wound yesterday 05/07/17; pyoderma gangrenosum. The patient saw Dr. Lillia Carmel yesterday and has a follow-up with her in one month. He is still using topical antibiotics to both wounds although he can't recall exactly what type. He is still on prednisone 60 mg. Dr. Lillia Carmel stated that the doxycycline could stop if we were in agreement. 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 The right medial leg was induced by biopsy/pathergy. The area on the right is essentially healed. 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 The right medial leg has closed and remains closed. He  is still on prednisone 60. oHe tells me he missed his last dermatology appointment with Dr. Lillia Carmel but will make another appointment. He reports that her blood sugar at a recent screen in [place] was high 200's. He was 180 today. 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 I think he is going to require still much longer prednisone perhaps another 3 months before attempting to taper. In the meantime his wound is a lot better. Smaller. He is cleaning this off daily and applying topical antibiotics. When he was last in the clinic I thought about changing to [facility] and actually put in a couple of calls to dermatology although probably not during their business hours. 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 He continues on oral prednisone. He has been using triple antibiotic ointment. There is surface debris and we will transition to [facility] and have him return in 2 weeks. He has lost 30 pounds since his last appointment with lifestyle modification. He may benefit from topical steroid cream for treatment this can be considered at a later date. 08/22/17 on evaluation today patient appears to actually be doing rather well in regard to his left lateral lower extremity ulcer. He has actually been managed by Dr. Dellia Nims most recently. Patient is currently on oral steroids at this time. This seems to have been of benefit for him. Nonetheless his last visit was actually with Leah on 08/06/17. Currently he is not utilizing any topical steroid creams although this could be of benefit as well. No fevers, chills, nausea, or vomiting noted at this time. 09/05/17 on evaluation today patient appears to be doing better in regard to his left lateral lower extremity ulcer. He has been tolerating the dressing changes without complication. He is using Santyl with good effect. Overall I'm  very pleased with how things are standing at this point. Patient likewise is happy that this is doing better. 09/19/17 on evaluation today patient actually appears to be doing rather well in regard to his left lateral lower extremity ulcer. 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 He's not having any significant pain. 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 He has been tolerating the Santyl without complication and in general I feel like he's making good progress. 10/17/17 on evaluation today patient  appears to be doing very well in regard to his left lateral lower surety ulcer. He has been tolerating the dressing changes without complication. There does not appear to be any evidence of infection he's alternating the Santyl and the triple antibiotic ointment every other day this seems to be doing well for him. 11/03/17 on evaluation today patient appears to be doing very well in regard to his left lateral lower extremity ulcer. He is been tolerating the dressing changes without complication which is good news. Fortunately there does not appear to be any evidence of infection which is also great news. Overall is doing excellent they are starting to taper down on the prednisone is down to 40 mg at this point it also started topical clobetasol for him. 11/17/17 on evaluation today patient appears to be doing well in regard to his left lateral lower surety ulcer. He's been tolerating the dressing changes without complication. He does note that he is having no pain, no excessive drainage or discharge, and overall he feels like things are going about how he would expect and hope they would. Overall he seems to have no evidence of infection at this time in my opinion which is good news. 12/04/17-He is seen in follow-up  evaluation for right lateral lower extremity ulcer. He has been applying topical steroid cream. Today's measurement show slight increase in size. Over the next 2 weeks we will transition to every other day Santyl and steroid cream. He has been encouraged to monitor for changes and notify clinic with any concerns 12/15/17 on evaluation today patient's left lateral motion the ulcer and fortunately is doing worse again at this point. This just since last week to this week has close to doubled in size according to the patient. I did not seeing last week's I do not have a visual to compare this to in our system was also down so we do not have all the charts and at this point. Nonetheless it does have me somewhat concerned in regard to the fact that again he was worried enough about it he has contact the dermatology that placed them back on the full strength, 50 mg a day of the prednisone that he was taken previous. He continues to alternate using clobetasol along with Santyl at this point. He is obviously somewhat frustrated. 12/22/17 on evaluation today patient appears to be doing a little worse compared to last evaluation. Unfortunately the wound is a little deeper and slightly larger than the last week's evaluation. With that being said he has made some progress in regard to the irritation surrounding at this time unfortunately despite that progress that's been made he still has a significant issue going on here. 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 [name], [name] ([id number]) For that reason I discussed with him today the possibility of perform a wound culture to ensure there's nothing overtly infected. 01/06/18 on evaluation today patient's wound is larger and deeper than previously evaluated. With that being said it did appear that his wound was  infected after my last evaluation with him. Subsequently I did end up prescribing a prescription for Bactrim DS which she has been taking and having no complication with. Fortunately there does not appear to be any evidence of infection at this point in time as far as anything spreading, no want to touch, and overall I feel like  things are showing signs of improvement. 01/13/18 on evaluation today patient appears to be even a little larger and deeper than last time. There still muscle exposed in the base of the wound. Nonetheless he does appear to be less erythematous I do believe inflammation is calming down also believe the infection looks like it's probably resolved at this time based on what I'm seeing. No fevers, chills, nausea, or vomiting noted at this time. 01/30/18 on evaluation today patient actually appears to visually look better for the most part. Unfortunately those visually this looks better he does seem to potentially have what may be an abscess in the muscle that has been noted in the central portion of the wound. This is the first time that I have noted what appears to be fluctuance in the central portion of the muscle. With that being said I'm somewhat more concerned about the fact that this might indicate an abscess formation at this location. I do believe that an ultrasound would be appropriate. This is likely something we need to try to do as soon as possible. He has been switch to mupirocin ointment and he is no longer using the steroid ointment as prescribed by dermatology he sees them again next week he's been decreased from 60 to 40 mg of prednisone. 03/09/18 on evaluation today patient actually appears to be doing a little better compared to last time I saw him. There's not as much erythema surrounding the wound itself. He I did review his most recent infectious disease note which was dated 02/24/18. He saw Dr. Michel Bickers in [place]. 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 Patient is now off of antibiotics and they are continually observing this. There seems to be no change in the past two weeks in my pinion based on what the patient says and what I see today compared to what Dr. Megan Salon likely saw two weeks ago. No fevers, chills, nausea, or vomiting noted at this time. 03/23/18 on evaluation today patient's wound actually appears to be showing signs of improvement which is good news. He is currently still on the Dapsone. He is also working on tapering the prednisone to get off of this and Dr. Lottie Rater is working with him in this regard. Nonetheless overall I feel like the wound is doing well it does appear based on the infectious disease note that I reviewed from Dr. 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 I did also review the results of his ultrasound of left lower extremity which revealed there was a dentist tissue in the base of the wound without an abscess noted. 04/06/18 on evaluation today the patient's left lateral lower extremity ulcer actually appears to be doing fairly well which is excellent news. There does not appear to be any evidence of infection at this time which is also great news. Overall he still does have a significantly large ulceration although little by little he seems to be making progress. He is down to 10 mg a day of the prednisone. 04/20/18 on evaluation today patient actually appears to be doing excellent at this time in regard to his left lower extremity ulcer. He's making signs of good progress unfortunately this is taking much longer than we would really like to see but nonetheless he is  making progress. Fortunately there does not appear to be any evidence of infection at this time. No fevers, chills, nausea, or vomiting noted at  this time. The patient has not been using the Santyl due to the cost he hadn't got in this field yet. He's mainly been using the antibiotic ointment topically. Subsequently he also tells me that he really has not been scrubbing in the shower I think this would be helpful again as I told him it doesn't have to be anything too aggressive to even make it believe just enough to keep it free of some of the loose slough and biofilm on the wound surface. 05/11/18 on evaluation today patient's wound appears to be making slow but sure progress in regard to the left lateral lower extremity ulcer. He is been tolerating the dressing changes without complication. Fortunately there does not appear to be any evidence of infection at this time. He is still just using triple antibiotic ointment along with clobetasol occasionally over the area. He never got the Santyl and really does not seem to intend to in my pinion. 06/01/18 on evaluation today patient appears to be doing a little better in regard to his left lateral lower extremity ulcer. He states that overall he does not feel like he is doing as well with the Dapsone as he did with the prednisone. Nonetheless he sees his dermatologist later today and is gonna talk to them about the possibility of going back on the prednisone. Overall again I believe that the wound would be better if you would utilize Santyl but he really does not seem to be interested in going back to the [place] at this point. He has been using triple antibiotic ointment. 06/15/18 on evaluation today patient's wound actually appears to be doing about the same at this point. Fortunately there is no signs of infection at this time. He has made slight improvements although he continues to not really want to clean the wound bed at this point. He states that he just doesn't mess with it he doesn't want to cause any problems with everything else he has going on. He has been on medication, antibiotics  as prescribed by his dermatologist, for a staff infection of his lower extremities which is really drying out now and looking much better he tells me. Fortunately there is no sign of overall infection. 06/29/18 on evaluation today patient appears to be doing well in regard to his left lateral lower surety ulcer all things considering. Fortunately his staff infection seems to be greatly improved compared to previous. He has no signs of infection and this is drying up quite nicely. He is still the doxycycline for this is no longer on cental, Dapsone, or any of the other medications. His dermatologist has recommended possibility of an infusion but right now he does not want to proceed with that. 07/13/18 on evaluation today patient appears to be doing about the same in regard to his left lateral lower surety ulcer. Fortunately there's no signs of infection at this time which is great news. Unfortunately he still builds up a significant amount of Slough/biofilm of the surface of the wound he still is not really cleaning this as he should be appropriately. Again I'm able to easily with saline and gauze remove the majority of this on the surface which if you would do this at home would likely be a dramatic improvement for him as far as getting the area to improve. Nonetheless overall I still feel like he  is making progress is just very slow. I think Santyl will be of benefit for him as well. Still he has not gotten this as of this point. 07/27/18 on evaluation today patient actually appears to be doing little worse in regards of the erythema around the periwound region of the wound he also tells me that he's been having more drainage currently compared to what he was experiencing last time I saw him. He states not quite as bad as what he had because this was infected previously but nonetheless is still appears to be doing poorly. Fortunately there is no evidence of systemic infection at this point. The patient  tells me that he is not going to be able to afford the Santyl. He is still waiting to hear about the infusion therapy with his dermatologist. Apparently she wants an updated colonoscopy first. 08/10/18 on evaluation today patient appears to be doing better in regard to his left lateral lower extremity ulcer. Fortunately he is showing signs of improvement in this regard he's actually been approved for Remicade infusion's as well although this has not been scheduled as of yet. Fortunately there's no signs of active infection at this time in regard to the wound although he is having some issues with infection of the right lower extremity is been seen as dermatologist for this. Fortunately they are definitely still working with him trying to keep things under control. [name], [name] ([id number]) 09/07/18 on evaluation today patient is actually doing rather well in regard to his left lateral lower extremity ulcer. He notes these actually having some hair grow back on his extremity which is something he has not seen in years. He also tells me that the pain is really not giving them any trouble at this time which is also good news overall she is very pleased with the progress he's using a combination of the mupirocin along with the probate is all mixed. 09/21/18 on evaluation today patient actually appears to be doing fairly well all things considered in regard to his looks from the ulcer. He's been tolerating the dressing changes without complication. Fortunately there's no signs of active infection at this time which is good news he is still on all antibiotics or prevention of the staff infection. He has been on prednisone for time although he states it is gonna contact his dermatologist and see if she put them on a short course due to some irritation that he has going on currently. Fortunately there's no evidence of any overall worsening this is going very slow I think cental would be something that would be  helpful for him although he states that $50 for tube is quite expensive. He therefore is not willing to get that at this point. 10/06/18 on evaluation today patient actually appears to be doing decently well in regard to his left lateral leg ulcer. He's been tolerating the dressing changes without complication. Fortunately there's no signs of active infection at this time. Overall I'm actually rather pleased with the progress he's making although it's slow he doesn't show any signs of infection and he does seem to be making some improvement. I do believe that he may need a switch up and dressings to try to help this to heal more appropriately and quickly. 10/19/18 on evaluation today patient actually appears to be doing better in regard to his left lateral lower extremity ulcer. This is shown signs of having much less Slough buildup at this point due to the fact he has been using  the Santyl. Obviously this is very good news. The overall size of the wound is not dramatically smaller but again the appearance is. 11/02/18 on evaluation today patient actually appears to be doing quite well in regard to his lower [place] ulcer. A lot of the skin around the ulcer is actually somewhat irritating at this point this seems to be more due to the dressing causing irritation from the adhesive that anything else. Fortunately there is no signs of active infection at this time. 11/24/18 on evaluation today patient appears to be doing a little worse in regard to his overall appearance of his lower extremity ulcer. There's more erythema and warmth around the wound unfortunately. He is currently on doxycycline which he has been on for some time. With that being said I'm not sure that seems to be helping with what appears to possibly be an acute cellulitis with regard to his left lower extremity ulcer. No fevers, chills, nausea, or vomiting noted at this time. 12/08/18 on evaluation today patient's wounds actually appears to  be doing significantly better compared to his last evaluation. He has been using Santyl along with alternating tripling about appointment as well as the steroid cream seems to be doing quite well and the wound is showing signs of improvement which is excellent news. Fortunately there's no evidence of infection and in fact his culture came back negative with only normal skin flora noted. 12/21/2018 upon evaluation today patient actually appears to be doing excellent with regard to his ulcer. This is actually the best that I have seen it since have been helping to take care of him. It is both smaller as well as less slough noted on the surface of the wound and seems to be showing signs of good improvement with new skin growing from the edges. He has been using just the triamcinolone he does wonder if he can get a refill of that ointment today. 01/04/2019 upon evaluation today patient actually appears to be doing well with regard to his left lateral lower extremity ulcer. With that being said it does not appear to be that he is doing quite as well as last time as far as progression is concerned. There does not appear to be any signs of infection or significant irritation which is good news. With that being said I do believe that he may benefit from switching to a collagen based dressing based on how clean The wound appears. 01/18/2019 on evaluation today patient actually appears to be doing well with regard to his wound on the left lower extremity. He is not made a lot of progress compared to where we were previous but nonetheless does seem to be doing okay at this time which is good news. There is no signs of active infection which is also good news. My only concern currently is I do wish we can get him into utilizing the collagen dressing his insurance would not pay for the supplies that we ordered although it appears that he may be able to order this through his supply company that he typically utilizes.  This is [employer]. Nonetheless he did try to order it during the office visit today and it appears this did go through. We will see if he can get that it is a different brand but nonetheless he has collagen and I do think will be beneficial. 02/01/2019 on evaluation today patient actually appears to be doing a little worse today in regard to the overall size of his wounds. Fortunately there  is no signs of active infection at this time. That is visually. Nonetheless when this is happened before it was due to infection. For that reason were somewhat concerned about that this time as well. 02/08/2019 on evaluation today patient unfortunately appears to be doing slightly worse with regard to his wound upon evaluation today. Is measuring a little deeper and a little larger unfortunately. I am not really sure exactly what is causing this to enlarge he actually did see his dermatologist she is going to see about initiating Humira for him. Subsequently she also did do steroid injections into the wound itself in the periphery. Nonetheless still nonetheless he seems to be getting a little bit larger he is gone back to just using the steroid cream topically which I think is appropriate. I would say hold off on the collagen for the time being is definitely a good thing to do. Based on the culture results which we finally did get the final result back regarding it shows staph as the bacteria noted again that can be a normal skin bacteria based on the fact however he is having increased drainage and worsening of the wound measurement wise I would go ahead and place him on an antibiotic today I do believe for this. 02/15/2019 on evaluation today patient actually appears to be doing somewhat better in regard to his ulcer. There is no signs of worsening at this time I did review his culture results which showed evidence of Staphylococcus aureus but not MRSA. Again this could just be more related to the normal skin  bacteria although he states the drainage has slowed down quite a bit he may have had a mild infection not just colonization. And was much smaller and then since around10/04/2019 on evaluation today patient appears to be doing unfortunately worse as far as the size of the wound. I really feel like that this is steadily getting larger again it had been doing excellent right at the beginning of September we have seen a steady increase in the area of the wound it is almost 2-1/2 times the size it was on September 1. Obviously this is a bad trend this is not wanting to see. For that reason we went back to using just the topical triamcinolone cream which does seem to help with inflammation. I checked him for bacteria by way of culture and nothing showed positive there. I am considering giving him a short course of a tapering steroid Dosepak today to see if that is can be beneficial for him. The patient is in agreement with giving that a try. 03/08/2019 on evaluation today patient appears to be doing very well in comparison to last evaluation with regard to his lower extremity ulcer. This is showing signs of less inflammation and actually measuring slightly smaller compared to last time every other week over the past month and a half he has been measuring larger larger larger. Nonetheless I do believe that the issue has been inflammation the prednisone does seem to [facility] ([id number]) have been beneficial for him which is good news. No fevers, chills, nausea, vomiting, or diarrhea. 03/22/2019 on evaluation today patient appears to be doing about the same with regard to his leg ulcer. He has been tolerating the dressing changes without complication. With that being said the wound seems to be mostly arrested at its current size but really is not making any progress except for when we prescribed the prednisone. He did show some signs of dropping as far as  the overall size of the wound during that interval  week. Nonetheless this is something he is not on long-term at this point and unfortunately I think he is getting need either this or else the Humira which his dermatologist has discussed try to get approval for. With that being said he will be seeing his dermatologist on the 11th of this month that is November. 04/19/2019 on evaluation today patient appears to be doing really about the same the wound is measuring slightly larger compared to last time I saw him. He has not been into the office since November 2 due to the fact that he unfortunately had Covid as that his entire family. He tells me that it was rough but they did pull-through and he seems to be doing much better. Fortunately there is no signs of active infection at this time. No fevers, chills, nausea, vomiting, or diarrhea. 05/10/2019 on evaluation today patient unfortunately appears to be doing significantly worse as compared to last time I saw him. He does tell me that he has had his first dose of Humira and actually is scheduled to get the next one in the upcoming week. With that being said he tells me also that in the past several days he has been having a lot of issues with green drainage she showed me a picture this is more blue-green in color. He is also been having issues with increased sloughy buildup and the wound does appear to be larger today. Obviously this is not the direction that we want everything to take based on the starting of his Humira. Nonetheless I think this is definitely a result of likely infection and to be honest I think this is probably Pseudomonas causing the infection based on what I am seeing. 05/24/2019 on evaluation today patient unfortunately appears to be doing significantly worse compared to his prior evaluation with me 2 weeks ago. I did review his culture results which showed that he does have Staph aureus as well as Pseudomonas noted on the culture. Nonetheless the Levaquin that I prescribed for him  does not appear to have been appropriate and in fact he tells me he is no longer experiencing the green drainage and discharge that he had at the last visit. Fortunately there is no signs of active infection at this time which is good news although the wound has significantly worsened it in fact is much deeper than it was previous. We have been utilizing up to this point triamcinolone ointment as the prescription topical of choice but at this time I really feel like that the wound is getting need to be packed in order to appropriately manage this due to the deeper nature of the wound. Therefore something along the lines of an alginate dressing may be more appropriate. 05/31/2019 upon inspection today patient's wound actually showed signs of doing poorly at this point. Unfortunately he just does not seem to be making any good progress despite what we have tried. He actually did go ahead and pick up the Cipro and start taking that as he was noticing more green drainage he had previously completed the Levaquin that I prescribed for him as well. Nonetheless he missed his appointment for the seventh last week on Wednesday with the wound care center and [facility] where his dermatologist referred him. Obviously I do think a second opinion would be helpful at this point especially in light of the fact that the patient seems to be doing so poorly despite the fact  that we have tried everything that I really know how at this point. The only thing that ever seems to have helped him in the past is when he was on high doses of continual steroids that did seem to make a difference for him. 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 06/07/2019 upon evaluation today patient unfortunately appears to be doing worse yet again with regard to his wound. In fact I  am starting to question whether or not he may have a fluid pocket in the muscle at this point based on the bulging and the soft appearance to the central portion of the muscle area. There is not anything draining from the muscle itself at this time which is good news but nonetheless the wound is expanding. I am not really seeing any results of the Humira as far as overall wound progression based on what I am seeing at this point. The patient has been referred for second opinion with regard to his wound to the [facility] wound care center by his dermatologist which I definitely am not in opposition to. Unfortunately we tried multiple dressings in the past including collagen, alginate, and at one point even Hydrofera Blue. With that being said he is never really used it for any significant amount of time due to the fact that he often complains of pain associated with these dressings and then will go back to either using the Santyl which she has done intermittently or more frequently the triamcinolone. He is also using his own compression stockings. We have wrapped him in the past but again that was something else that he really was not a big fan of. 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 He seemed to do much better when he was on the steroids although honestly I understand that the steroids are not necessarily the best medication to be on long-term obviously 06/14/2019 on evaluation today patient appears to be doing actually a little bit better with regard to the overall appearance with his leg. Unfortunately he does continue to have issues with what appears to be some fluid underneath  the muscle although he did see the wound specialty center at [facility] last week their main goals were to see about infusion therapy in place of the Humira as they feel like that is not quite strong enough. They also recommended that we continue with the treatment otherwise as we are they felt like that was appropriate and they are okay with him continuing to follow-up here with [place] in that regard. With that being said they are also sending him to the vein specialist there to see about vein stripping and if that would be of benefit for him. Subsequently they also did not really address whether or not an ultrasound of the muscle area to see if there is anything that needs to be addressed here would be appropriate or not. For that reason I discussed this with him last week I think we  may proceed down that road at this point. 06/21/2019 upon evaluation today patient's wound actually appears to be doing slightly better compared to previous evaluations. I do believe that he has made a difference with regard to the progression here with the use of oral steroids. Again in the past has been the only thing that is really calm things down. He does tell me that from [facility] is gotten a good news from there that there are no further vein stripping that is necessary at this point. I do not have that available for review today although the patient did relay this to me. He also did obtain and have the ultrasound of the wound completed which I did sign off on today. It does appear that there is no fluid collection under the muscle this is likely then just edematous tissue in general. That is also good news. Overall I still believe the inflammation is the main issue here. He did inquire about the possibility of a wound VAC again with the muscle protruding like it is I am not really sure whether the wound VAC is necessarily ideal or not. That is something we will have to consider although I do believe he may need compression  wrapping to try to help with edema control which could potentially be of benefit. 06/28/2019 on evaluation today patient appears to be doing slightly better measurement wise although this is not terribly smaller he least seems to be trending towards that direction. With that being said he still seems to have purulent drainage noted in the wound bed at this time. He has been on Levaquin followed by Cipro over the past month. Unfortunately he still seems to have some issues with active infection at this time. I did perform a culture last week in order to evaluate and see if indeed there was still anything going on. Subsequently the culture did come back showing Pseudomonas which is consistent with the drainage has been having which is blue-green in color. He also has had an odor that again was somewhat consistent with Pseudomonas as well. Long story short it appears that the culture showed an intermediate finding with regard to how well the Cipro will work for the Pseudomonas infection. Subsequently being that he does not seem to be clearing up and at best what we are doing is just keeping this at [place] I think he may need to see infectious disease to discuss IV antibiotic options. ALEKSANDR, PELLOW ([id number]) 07/05/2019 upon evaluation today patient appears to be doing okay in regard to his leg ulcer. He has been tolerating the dressing changes at this point without complication. Fortunately there is no signs of active infection at this time which is good news. No fevers, chills, nausea, vomiting, or diarrhea. With that being said he does have an appointment with infectious disease tomorrow and his primary care on Wednesday. Again the reason for the infectious disease referral was due to the fact that he did not seem to be fully resolving with the use of oral antibiotics and therefore we were thinking that IV antibiotic therapy may be necessary secondary to the fact that there was an intermediate finding for  how effective the Cipro may be. Nonetheless again he has been having a lot of purulent and even green drainage. Fortunately right now that seems to have calmed down over the past week with the reinitiation of the oral antibiotic. Nonetheless we will see what Dr. Megan Salon has to say. 07/12/2019 upon evaluation today patient appears to be  doing about the same at this point in regard to his left lower extremity ulcer. Fortunately there is no signs of active infection at this time which is good news I do believe the Levaquin has been beneficial I did review Dr. Hale Bogus note and to be honest I agree that the patient's leg does appear to be doing better currently. What we found in the past as he does not seem to really completely resolve he will stop the antibiotic and then subsequently things will revert back to having issues with blue-green drainage, increased pain, and overall worsening in general. Obviously that is the reason I sent him back to infectious disease. 07/19/2019 upon evaluation today patient appears to be doing roughly the same in size there is really no dramatic improvement. He has started back on the Levaquin at this point and though he seems to be doing okay he did still have a lot of blue/green drainage noted on evaluation today unfortunately. I think that this is still indicative more likely of a Pseudomonas infection as previously noted and again he does see Dr. Megan Salon in just a couple of days. I do not know that were really able to effectively clear this with just oral antibiotics alone based on what I am seeing currently. Nonetheless we are still continue to try to manage as best we can with regard to the patient and his wound. I do think the wrap was helpful in decreasing the edema which is excellent news. No fevers, chills, nausea, vomiting, or diarrhea. 07/26/2019 upon evaluation today patient appears to be doing slightly better with regard to the overall appearance of the muscle  there is no dark discoloration centrally. Fortunately there is no signs of active infection at this time. No fevers, chills, nausea, vomiting, or diarrhea. Patient's wound bed currently the patient did have an appointment with Dr. Megan Salon at infectious disease last week. With that being said Dr. Megan Salon the patient states was still somewhat hesitant about put him on any IV antibiotics he wanted [place] to repeat cultures today and then see where things go going forward. He does look like Dr. Megan Salon because of some improvement the patient did have with the Levaquin wanted [place] to see about repeating cultures. If it indeed grows the Pseudomonas again then he recommended a possibility of considering a PICC line placement and IV antibiotic therapy. He plans to see the patient back in 1 to 2 weeks. 08/02/2019 upon evaluation today patient appears to be doing poorly with regard to his left lower extremity. We did get the results of his culture back it shows that he is still showing evidence of Pseudomonas which is consistent with the purulent/blue-green drainage that he has currently. Subsequently the culture also shows that he now is showing resistance to the oral fluoroquinolones which is unfortunate as that was really the only thing to treat the infection prior. I do believe that he is looking like this is going require IV antibiotic therapy to get this under control. Fortunately there is no signs of systemic infection at this time which is good news. The patient does see Dr. Megan Salon tomorrow. 08/09/2019 upon evaluation today patient appears to be doing better with regard to his left lower extremity ulcer in regard to the overall appearance. He is currently on IV antibiotic therapy. As ordered by Dr. Megan Salon. Currently the patient is on ceftazidime which she is going to take for the next 2 weeks and then follow-up for 4 to 5-week appointment with Dr. Megan Salon.  The patient started this this past Friday  symptoms have not for a total of 3 days currently in full. 08/16/2019 upon evaluation today patient's wound actually does show muscle in the base of the wound but in general does appear to be much better as far as the overall evidence of infection is concerned. In fact I feel like this is for the most part cleared up he still on the IV antibiotics he has not completed the full course yet but I think he is doing much better which is excellent news. 08/23/2019 upon evaluation today patient appears to be doing about the same with regard to his wound at this point. He tells me that he still has pain unfortunately. Fortunately there is no evidence of systemic infection at this time which is great news. There is significant muscle protrusion. 09/13/19 upon evaluation today patient appears to be doing about the same in regard to his leg unfortunately. He still has a lot of drainage coming from the ulceration there is still muscle exposed. With that being said the patient's last wound culture still showed an intermediate finding with regard to the Pseudomonas he still having the bluish/green drainage as well. Overall I do not know that the wound has completely cleared of infection at this point. Fortunately there is no signs of active infection systemically at this point which is good news. 09/20/2019 upon evaluation today patient's wound actually appears to be doing about the same based on what I am seeing currently. I do not see any signs of systemic infection he still does have evidence of some local infection and drainage. He did see Dr. Megan Salon last week and Dr. Megan Salon states that he probably does need a different IV antibiotic although he does not want to put him on this until the patient begins the Remicade infusion which is actually scheduled for about 10 days out from today on 13 May. Following that time Dr. Megan Salon is good to see him back and then will evaluate the feasibility of starting him on the  IV antibiotic therapy once again at that point. I do not disagree with this plan I do believe as Dr. Megan Salon stated in his note that I reviewed today that the patient's issue is multifactorial with the pyoderma being 1 aspect of this that were hoping the Remicade will be helpful for her. In the meantime I think the gentamicin is, helping to keep things under decent okay control in regard to the ulcer. 09/27/2019 upon evaluation today patient appears to be doing about the same with regard to his wound still there is a lot of muscle exposure though he does have some hyper granulation tissue noted around the edge and actually some granulation tissue starting to form over the muscle which is actually good news. Fortunately there is no evidence of active infection which is also good news. His pain is less at this point. 5/21; this is a patient I have not seen in a long time. 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 He has a large wound on the left lateral leg with protruding muscle. He comes in the clinic today showing the same area on his left medial ankle. He says there is been a spot there for some time although we have not previously defined this. Today he has a clearly defined area with slight amount of skin breakdown surrounded by raised areas with a purplish hue in color. This is not painful he says it is irritated.  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 He still has muscle protruding from the lateral portion of his left leg but fortunately the new area that they were concerned about at his last visit does not appear to have opened at this point. He is currently on Remicade infusions and seems to be doing better in my opinion in fact the wound itself seems to be overall much better. The purplish discoloration that he did have seems to have resolved  and I think that is a good sign that hopefully the Remicade is doing its job. He does have some biofilm noted over the surface of the wound. 11/01/2019 on evaluation today patient's wound actually appears to be doing excellent at this time. Fortunately there is no evidence of active infection and overall I feel like he is making great progress. The Remicade seems to be due excellent job in my opinion. [name], [name] ([id number]) 11/08/19 evaluation today vision actually appears to be doing quite well with regard to his weight ulcer. He's been tolerating dressing changes without complication. Fortunately there is no evidence of infection. No fevers, chills, nausea, or vomiting noted at this time. Overall states that is having more itching than pain which is actually a good sign in my opinion. 12/13/2019 upon evaluation today patient appears to be doing well today with regard to his wound. He has been tolerating the dressing changes without complication. Fortunately there is no sign of active infection at this time. No fevers, chills, nausea, vomiting, or diarrhea. Overall I feel like the infusion therapy has been very beneficial for him. 01/06/2020 on evaluation today patient appears to be doing well with regard to his wound. This is measuring smaller and actually looks to be doing better. Fortunately there is no signs of active infection at this point. No fevers, chills, nausea, vomiting, or diarrhea. With that being said he does still have the blue-green drainage but this does not seem to be causing any significant issues currently. He has been using the gentamicin that does seem to be keeping things under decent control at this point. He goes later this morning for his next infusion therapy for the pyoderma which seems to also be very beneficial. 02/07/2020 on evaluation today patient appears to be doing about the same in regard to his wounds currently. Fortunately there is no signs of active infection  systemically he does still have evidence of local infection still using gentamicin. He also is showing some signs of improvement albeit slowly I do feel like we are making some progress here. 02/21/2020 upon evaluation today patient appears to be making some signs of improvement the wound is measuring a little bit smaller which is great news and overall I am very pleased with where he stands currently. He is going to be having infusion therapy treatment on the 15th of this month. Fortunately there is no signs of active infection at this time. 03/13/2020 I do believe patient's wound is actually showing some signs of improvement here which is great news. He has continue with the infusion therapy through rheumatology/dermatology at [facility] [place]. That does seem to be beneficial. I still think he gets as much benefit from this as he did from the prednisone initially but nonetheless obviously this is less harsh on his body that the prednisone as far as they are concerned. 03/31/2020 on evaluation today patient's wound actually showing signs of some pretty good improvement in regard  to the overall appearance of the wound bed. There is still muscle exposed though he does have some epithelial growth around the edges of the wound. Fortunately there is no signs of active infection at this time. No fevers, chills, nausea, vomiting, or diarrhea. 04/24/2020 upon evaluation today patient appears to be doing about the same in regard to his leg ulcer. He has been tolerating the dressing changes without complication. Fortunately there is no signs of active infection at this time. No fevers, chills, nausea, vomiting, or diarrhea. With that being said he still has a lot of irritation from the bandaging around the edges of the wound. We did discuss today the possibility of a referral to plastic surgery. 05/22/2020 on evaluation today patient appears to be doing well with regard to his wounds all things considered. He has not been able  to get the Chantix apparently there is a recall nurse that I was unaware of put out by [employer] involuntarily. Nonetheless for now I am and I have to do some research into what may be the best option for him to help with quitting in regard to smoking and we discussed that today. 06/26/2020 upon evaluation today patient appears to be doing well with regard to his wound from the standpoint of infection I do not see any signs of infection at this point. With that being said unfortunately he is still continuing to have issues with muscle exposure and again he is not having a whole lot of new skin growth unfortunately. There does not appear to be any signs of active infection at this time. No fevers, chills, nausea, vomiting, or diarrhea. 07/10/2020 upon evaluation today patient appears to be doing a little bit more poorly currently compared to where he was previous. I am concerned currently about an active infection that may be getting worse especially in light of the increased size and tenderness of the wound bed. No fevers, chills, nausea, vomiting, or diarrhea. 07/24/2020 upon evaluation today patient appears to be doing poorly in regard to his leg ulcer. He has been tolerating the dressing changes without complication but unfortunately is having a lot of discomfort. 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 Subsequently I think he is going require possibly IV antibiotics to get this under control. I am very concerned about the severity of his infection and the amount of discomfort he is having. 07/31/2020 upon evaluation today patient appears to be doing about the same in regard to his leg wound. He did see Dr. Megan Salon and Dr. Megan Salon is actually going to start him on IV antibiotics. He goes for the PICC line tomorrow. With that being said there do not have that run for 2 weeks and then see how things are doing and depending on how he  is progressing they may extend that a little longer. Nonetheless I am glad this is getting ready to be in place and definitely feel it may help the patient. In the meantime is been using mainly triamcinolone to the wound bed has an anti-inflammatory. 08/07/2020 on evaluation today patient appears to be doing well with regard to his wound compared even last week. In the interim he has gotten the PICC line placed and overall this seems to be doing excellent. There does not appear to be any evidence of infection which is great news systemically although locally of course has had the infection this appears to be improving with the use of the antibiotics. 08/14/2020 upon  evaluation today patient's wound actually showing signs of excellent improvement. Overall the irritation has significantly improved the drainage is back down to more of a normal level and his pain is really pretty much nonexistent compared to what it was. Obviously I think that this is significantly improved secondary to the IV antibiotic therapy which has made all the difference in the world. Again he had a resistant form of Pseudomonas for which oral antibiotics just was not cutting it. Nonetheless I do think that still we need to consider the possibility of a surgical closure for this wound is been open so long and to be honest with muscle exposed I think this can be very hard to get this to close outside of this although definitely were still working to try to do what we can in that regard. 08/21/2020 upon evaluation today patient appears to be doing very well with regard to his wounds on the left lateral lower extremity/calf area. Fortunately there does not appear to be signs of active infection which is great news and overall very pleased with where things stand today. He is actually wrapping up his treatment with IV antibiotics tomorrow. After that we will see where things go from there. 08/28/2020 upon evaluation today patient appears  to be doing decently well with regard to his leg ulcer. There does not appear to be any signs of active infection which is great news and overall very pleased with where things stand today. No fevers, chills, nausea, vomiting, or diarrhea. 09/18/2020 upon evaluation today patient appears to be doing well with regard to his infection which I feel like is better. Unfortunately he is not doing as well with regard to the overall size of the wound which is not nearly as good at this point. I feel like that he may be having an issue here with the pyoderma being somewhat out of control. I think that he may benefit from potentially going back and talking to the dermatologist about CALDEN, DORSEY ([id number]) what to do from the pyoderma standpoint. I am not certain if the infusions are helping nearly as much is what the prednisone did in the past. 10/02/2020 upon evaluation today patient appears to be doing well with regard to his leg ulcer. He did go to the Psychiatric nurse. Unfortunately they feel like there is a 10% chance that most that he would be able to heal and that the skin graft would take. Obviously this has led him to not be able to go down that path as far as treatment is concerned. Nonetheless he does seem to be doing a little bit better with the prednisone that I gave him last time. I think that he may need to discuss with dermatology the possibility of long-term prednisone as that seems to be what is most helpful for him to be perfectly honest. I am not sure the Remicade is really doing the job. 10/17/2020 upon evaluation today patient appears to be doing a little better in regard to his wound. In fact the case has been since we did the prednisone on May 2 for him that we have noticed a little bit of improvement each time we have seen a size wise as well as appearance wise as well as pain wise. I think the prednisone has had a greater effect then the infusion therapy has to be perfectly honest. With  that being said the patient also feels significantly better compared to what he was previous. All of this is good news but  nonetheless I am still concerned about the fact that again we are really not set up to long-term manage him as far as prednisone is concerned. Obviously there are things that you need to be watched I completely understand the risk of prednisone usage as well. 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 With all that being said I do believe that we can give him another round of the prednisone which she is requesting today because of the improvement that he seen since we did that first round. Electronic Signature(s) Signed: 10/17/2020 9:03:44 AM By: Worthy Keeler PA-C Entered By: Worthy Keeler on 10/17/2020 09:03:44 DANELL, VERNO ([id number]) -------------------------------------------------------------------------------- Physical Exam Details Patient Name: [name] Date of Service: 10/17/2020 8:15 AM Medical Record Number: [id number] Patient Account Number: [id number] Date of Birth/Sex: 1980/08/08 (39 y.o. M) Treating RN: Dolan Amen Primary Care Provider: [name] Other Clinician: Referring Provider: [name] Treating Provider/Extender: [name] Cliche in Treatment: 201 Constitutional Obese and well-hydrated in no acute distress. Respiratory normal breathing without difficulty. Psychiatric this patient is able to make decisions and demonstrates good insight into disease process. Alert and Oriented x 3. pleasant and cooperative. Notes Upon inspection patient's wound bed actually showed signs of good granulation and some areas he had some good epithelial growth around the edges of the wound as well. With that being said this is still a significant wound that is going to take quite a bit of time to heal. He still smoking as well we discussed this multiple visits he tells me there is just too much  stress right now for him to consider quitting. Electronic Signature(s) Signed: 10/17/2020 9:04:06 AM By: Worthy Keeler PA-C Entered By: Worthy Keeler on 10/17/2020 09:04:06 ORLEY, LAWRY ([id number]) -------------------------------------------------------------------------------- Physician Orders Details Patient Name: [name] Date of Service: 10/17/2020 8:15 AM Medical Record Number: [id number] Patient Account Number: [id number] Date of Birth/Sex: [birth year] (39 y.o. 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 Bathing/ Shower/ Hygiene o Clean wound with Normal Saline or wound cleanser. Wound Treatment Wound #1 - Lower Leg Wound Laterality: Left, Lateral Cleanser: Normal Saline 1 x Per Day/30 Days Discharge Instructions: Wash your hands with soap and water. Remove old dressing, discard into plastic bag and place into trash. Cleanse the wound with Normal Saline prior to applying a clean dressing using gauze sponges, not tissues or cotton balls. Do not scrub or use excessive force. Pat dry using gauze sponges, not tissue or cotton balls. Topical: Triamcinolone Acetonide Cream, 0.1%, 15 (g) tube 1 x Per Day/30 Days Discharge Instructions: Apply as directed by provider. Secondary Dressing: Mepilex Border Flex, 6x6 (in/in) 1 x Per Day/30 Days Discharge Instructions:  Apply to wound as directed. Do not cut. Secured With: Tubigrip Size F, 4x10 (in/yd) 1 x Per Day/30 Days Discharge Instructions: Apply double layer Tubigrip F 3 finger-widths below knee to base of toes to secure dressing and/or for swelling. Patient Medications Allergies: milk, Biaxin, seasonal Notifications Medication Indication Start End  prednisone 10/17/2020 DOSE 1 - oral 50 mg tablet - 1 tablet oral taken 1 time per day in the morning with food x 7 days. Do not take ibuprofen or Aleve with this medication Electronic Signature(s) Signed: 10/17/2020 9:06:01 AM By: Worthy Keeler PA-C Entered By: Worthy Keeler on 10/17/2020 09:06:01 MAXI, RODAS ([id number]) -------------------------------------------------------------------------------- Problem List Details Patient Name: [name] Date of Service: 10/17/2020 8:15 AM Medical Record Number: [id number] Patient Account Number: [id number] Date of Birth/Sex: 10/08/1980 (39 y.o. M) Treating RN: Dolan Amen Primary Care Provider: [name] Other Clinician: Referring Provider: [name] Treating Provider/Extender: [name] Cliche in Treatment: 201 Active Problems ICD-10 Encounter Code Description Active Date MDM Diagnosis L97.222 Non-pressure chronic ulcer of left calf with fat layer exposed 12/04/2016 No Yes L88 Pyoderma gangrenosum 03/26/2017 No Yes L97.321 Non-pressure chronic ulcer of left ankle limited to breakdown of skin 10/08/2019 No Yes I87.2 Venous insufficiency (chronic) (peripheral) 12/04/2016 No Yes L03.116 Cellulitis of left lower limb 05/24/2019 No Yes F17.208 Nicotine dependence, unspecified, with other nicotine-induced disorders 04/24/2020 No Yes Inactive Problems ICD-10 Code Description Active Date Inactive Date L97.213 Non-pressure chronic ulcer of right calf with necrosis of muscle 04/02/2017 04/02/2017 Resolved Problems Electronic Signature(s) Signed: 10/17/2020 8:24:16 AM By: Worthy Keeler  PA-C Entered By: Worthy Keeler on 10/17/2020 08:24:16 Frayne, [name] ([id number]) -------------------------------------------------------------------------------- Progress Note Details Patient Name: [name] Date of Service: 10/17/2020 8:15 AM Medical Record Number: [id number] Patient Account Number: [id number] Date of Birth/Sex: [birth year] (39 y.o. M) Treating RN: Dolan Amen Primary Care Provider: [name] Other Clinician: Referring Provider: [name] Treating Provider/Extender: [name] Cliche in Treatment: 201 Subjective Chief Complaint Information obtained from Patient He is here in follow up evaluation for LLE pyoderma ulcer History of Present Illness (HPI) 12/04/16; 40 year old man who comes into the clinic today for review of a wound on the posterior left calf. He tells me that is been there for about a year. He is not a diabetic he does smoke half a pack per day. He was seen in the ER on 11/20/16 felt to have cellulitis around the wound and was given clindamycin. An x-ray did not show osteomyelitis. The patient initially tells me that he has a milk allergy that sets off a pruritic itching rash on his lower legs which she scratches incessantly and he thinks that's what may have set up the wound. He has been using various topical antibiotics and ointments without any effect. He works in a [employer]ing [employer] and is on his feet all day. He does not have a prior history of wounds however he does have the rash on both lower legs the right arm and the ventral aspect of his left arm. These are excoriations and clearly have had scratching however there are of macular looking areas on both legs including a substantial larger area on the right leg. This does not have an underlying open area. There is no blistering. The patient tells me that 2 years ago in [place] in response to the rash on his legs he saw a dermatologist who told him he had a condition which may be pyoderma  gangrenosum although I may be putting words into his mouth. He seemed to recognize this. 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 He is not in any treatment for this. He's had no recent travel 12/11/16; the patient arrives today with his wound and roughly the same condition we've been using silver alginate this is a  deep punched out wound with some surrounding erythema but no tenderness. Biopsy I did did not show confirmed pyoderma gangrenosum suggested nonspecific inflammation and vasculitis but does not provide an actual description of what was seen by the pathologist. I'm really not able to understand this We have also received information from the patient's dermatologist in [place] notes from April 2016. This was a doctor Agarwal-antal. 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 He was prescribed topical steroid high potency under occlusion which helped but at this point the patient did not have a deep punched out wound. 12/18/16; the patient's wound is larger in terms of surface area however this surface looks better and there is less depth. The surrounding erythema also is better. The patient states that the wrap we put on came off 2 days ago when he has been using his compression stockings. He we are in the process of getting a dermatology consult. 12/26/16 on evaluation today patient's left lower extremity wound shows evidence of infection with surrounding erythema noted. He has been tolerating the dressing changes but states that he has noted more discomfort. There is a larger area of erythema surrounding the wound. No fevers, chills, nausea, or vomiting noted at this time. With that being said the wound still does have slough covering the surface. He is not allergic to any medication that he is aware of at this point. In regard to his right lower extremity he had several regions that are erythematous and pruritic he wonders if there's  anything we can do to help that. 01/02/17 I reviewed patient's wound culture which was obtained his visit last week. He was placed on doxycycline at that point. Unfortunately that does not appear to be an antibiotic that would likely help with the situation however the pseudomonas noted on culture is sensitive to Cipro. Also unfortunately patient's wound seems to have a large compared to last week's evaluation. Not severely so but there are definitely increased measurements in general. He is continuing to have discomfort as well he writes this to be a seven out of 10. In fact he would prefer me not to perform any debridement today due to the fact that he is having discomfort and considering he has an active infection on the little reluctant to do so anyway. No fevers, chills, nausea, or vomiting noted at this time. 01/08/17; patient seems dermatology on September 5. I suspect dermatology will want the slides from the biopsy I did sent to their pathologist. I'm not sure if there is a way we can expedite that. In any case the culture I did before I left on vacation 3 weeks ago showed Pseudomonas he was given 10 days of Cipro and per her description of her intake nurses is actually somewhat better this week although the wound is quite a bit bigger than I remember the last time I saw this. He still has 3 more days of Cipro 01/21/17; dermatology appointment tomorrow. He has completed the ciprofloxacin for Pseudomonas. Surface of the wound looks better however he is had some deterioration in the lesions on his right leg. Meantime the left lateral leg wound we will continue with sample 01/29/17; patient had his dermatology appointment but I can't yet see that note. He is completed his antibiotics. The wound is more superficial but considerably larger in circumferential area than when he came in. This is in his left lateral calf. He also has swollen erythematous areas with superficial wounds on the right leg and  small  papular areas on both arms. There apparently areas in her his upper thighs and buttocks I did not look at those. Dermatology biopsied the right leg. Hopefully will have their input next week. 02/05/17; patient went back to see his dermatologist who told him that he had a "scratching problem" as well as staph. He is now on a 30 day course of doxycycline and I believe she gave him triamcinolone cream to the right leg areas to help with the itching [not exactly sure but probably triamcinolone]. She apparently looked at the left lateral leg wound although this was not rebiopsied and I think felt to be ultimately part of the same pathogenesis. He is using sample border foam and changing nevus himself. He now has a new open area on the right posterior leg which was his biopsy site I don't have any of the dermatology notes 02/12/17; we put the patient in compression last week with SANTYL to the wound on the left leg and the biopsy. Edema is much better and the depth of the wound is now at level of skin. Area is still the same Biopsy site on the right lateral leg we've also been using santyl with a border foam dressing and he is changing this himself. 02/19/17; Using silver alginate started last week to both the substantial left leg wound and the biopsy site on the right wound. He is tolerating compression well. Has a an appointment with his primary M.D. tomorrow wondering about diuretics although I'm wondering if the edema problem is actually lymphedema [name], [name] ([id number]) 02/26/17; the patient has been to see his primary doctor Dr. Jerrel Ivory at [facility] our primary care. She started him on Lasix 20 mg and this seems to have helped with the edema. However we are not making substantial change with the left lateral calf wound and inflammation. The biopsy site on the right leg also looks stable but not really all that different. 03/12/17; the patient has been to see vein and vascular Dr. Lucky Cowboy. He  has had venous reflux studies I have not reviewed these. I did get a call from his dermatology office. 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 They therefore recommended clobetasol and tetracycline i.e. 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 03/26/17; apparently the patient just had reflux studies not an appointment with Dr. dew. She arrives in clinic today having applied clobetasol for 2-3 weeks. He notes over the last 2-3 days excessive drainage having to change the dressing 3-4 times a day and also expanding erythema. 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 Lillia Carmel at [facility] dermatology locally who kindly saw him at my request. 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 My concern was the degree of erythema medially extending from his left leg wound which was either inflammation from pyoderma or cellulitis. I put him on Augmentin however culture of the wound showed Pseudomonas which is quinolone sensitive. I really don't  believe he has cellulitis however in view of everything I will continue and give him a course of Cipro. He is also on doxycycline as an immune modulator for the pyoderma. In addition to his original wound on the left lateral leg with surrounding erythema he has a wound on the right posterior calf which was an original biopsy site done by dermatology. 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 Saw Dr. Lillia Carmel yesterday. He has been using topical antibiotics to both wound areas his original wound on the left and the biopsies/pathergy area on the right. There is definitely some improvement in the inflammation around the wound on the right although the patient states he has increasing sensitivity of the wounds. He is on prednisone 60 and doxycycline 1 as prescribed by Dr. Lillia Carmel. He is covering the topical antibiotic with gauze and putting this in his own compression stocks and changing this daily. He states that Dr. Lottie Rater did a culture of the left leg wound yesterday 05/07/17; pyoderma gangrenosum. The patient saw Dr. Lillia Carmel yesterday and has a follow-up with her in one month. He is still using topical antibiotics to both wounds although he can't recall exactly what type. He is still on prednisone 60 mg. Dr. Lillia Carmel stated that the doxycycline could stop if we were in agreement. 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 The right medial leg was induced by biopsy/pathergy. The area on the right is essentially healed. 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 The right medial leg has closed and remains closed. He is still on prednisone 60. He tells me he missed his last dermatology appointment with Dr. Lillia Carmel but will make another appointment. He reports that her blood sugar at a recent screen in [place] was high 200's. He was 180 today. 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 I think he is going to require still much longer prednisone perhaps another 3 months before attempting to taper. In the meantime his wound is a lot better. Smaller. He is cleaning this off daily and applying topical  antibiotics. When he was last in the clinic I thought about changing to [facility] and actually put in a couple of calls to dermatology although probably not during their business hours. 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 He continues on oral prednisone. He has been using triple antibiotic ointment. There is surface debris and we will transition to [facility] and have him return in 2 weeks. He has lost 30 pounds since his last appointment with lifestyle modification. He may benefit from topical steroid cream for treatment this can be considered at a later date. 08/22/17 on evaluation today patient appears to actually be doing rather well in regard to his left lateral lower extremity ulcer. He has actually been managed by Dr. Dellia Nims most recently. Patient is currently on oral steroids at this time. This seems to have been of benefit for him. Nonetheless his last visit was actually with Leah on 08/06/17. Currently he is not utilizing any topical steroid creams although this could be of benefit as well. No fevers, chills, nausea, or vomiting noted at this time. 09/05/17 on evaluation  today patient appears to be doing better in regard to his left lateral lower extremity ulcer. He has been tolerating the dressing changes without complication. He is using Santyl with good effect. Overall I'm very pleased with how things are standing at this point. Patient likewise is happy that this is doing better. 09/19/17 on evaluation today patient actually appears to be doing rather well in regard to his left lateral lower extremity ulcer. 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 He's not having any significant pain. 10/03/17 on evaluation today patient appears to be doing excellent in regard to his lower extremity  wound on the left secondary to Pyoderma gangrenosum. He has been tolerating the Santyl without complication and in general I feel like he's making good progress. 10/17/17 on evaluation today patient appears to be doing very well in regard to his left lateral lower surety ulcer. He has been tolerating the dressing changes without complication. There does not appear to be any evidence of infection he's alternating the Santyl and the triple antibiotic ointment every other day this seems to be doing well for him. 11/03/17 on evaluation today patient appears to be doing very well in regard to his left lateral lower extremity ulcer. He is been tolerating the dressing changes without complication which is good news. Fortunately there does not appear to be any evidence of infection which is also great news. Overall is doing excellent they are starting to taper down on the prednisone is down to 40 mg at this point it also started topical clobetasol for him. 11/17/17 on evaluation today patient appears to be doing well in regard to his left lateral lower surety ulcer. He's been tolerating the dressing changes without complication. He does note that he is having no pain, no excessive drainage or discharge, and overall he feels like things are going about how he would expect and hope they would. Overall he seems to have no evidence of infection at this time in my opinion which is good news. 12/04/17-He is seen in follow-up evaluation for right lateral lower extremity ulcer. He has been applying topical steroid cream. Today's measurement show slight increase in size. Over the next 2 weeks we will transition to every other day Santyl and steroid cream. He has been encouraged to monitor for changes and notify clinic with any concerns 12/15/17 on evaluation today patient's left lateral motion the ulcer and fortunately is doing worse again at this point. This just since last week to this week has close to doubled in size  according to the patient. I did not seeing last week's I do not have a visual to compare this to in our system was also down so we do not have all the charts and at this point. Nonetheless it does have me somewhat concerned in regard to the fact that again he was worried enough about it he has contact the dermatology that placed them back on the full strength, 50 mg a day of the prednisone that he was taken previous. He continues to alternate using clobetasol along with Santyl at this point. He is obviously somewhat frustrated. [name], [name] ([id number]) 12/22/17 on evaluation today patient appears to be doing a little worse compared to last evaluation. Unfortunately the wound is a little deeper and slightly larger than the last week's evaluation. With that being said he has made some progress in regard to the irritation surrounding at this time unfortunately  despite that progress that's been made he still has a significant issue going on here. 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 For that reason I discussed with him today the possibility of perform a wound culture to ensure there's nothing overtly infected. 01/06/18 on evaluation today patient's wound is larger and deeper than previously evaluated. With that being said it did appear that his wound was infected after my last evaluation with him. Subsequently I did end up prescribing a prescription for Bactrim DS which she has been taking and having no complication with. Fortunately there does not appear to be any evidence of infection at this point in time as far as anything spreading, no want to touch, and overall I feel like things are showing signs of improvement. 01/13/18 on evaluation today patient appears to be even a little larger and deeper than last time. There still muscle exposed in the base  of the wound. Nonetheless he does appear to be less erythematous I do believe inflammation is calming down also believe the infection looks like it's probably resolved at this time based on what I'm seeing. No fevers, chills, nausea, or vomiting noted at this time. 01/30/18 on evaluation today patient actually appears to visually look better for the most part. Unfortunately those visually this looks better he does seem to potentially have what may be an abscess in the muscle that has been noted in the central portion of the wound. This is the first time that I have noted what appears to be fluctuance in the central portion of the muscle. With that being said I'm somewhat more concerned about the fact that this might indicate an abscess formation at this location. I do believe that an ultrasound would be appropriate. This is likely something we need to try to do as soon as possible. He has been switch to mupirocin ointment and he is no longer using the steroid ointment as prescribed by dermatology he sees them again next week he's been decreased from 60 to 40 mg of prednisone. 03/09/18 on evaluation today patient actually appears to be doing a little better compared to last time I saw him. There's not as much erythema surrounding the wound itself. He I did review his most recent infectious disease note which was dated 02/24/18. He saw Dr. Michel Bickers in [place]. 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 Patient is now off of antibiotics and they are continually observing this. There seems to be no change in the past two weeks in my pinion based on what the patient says and what I see today compared to what Dr. Megan Salon likely saw two weeks ago. No fevers, chills, nausea, or vomiting noted at this time. 03/23/18 on evaluation today patient's wound actually appears to be showing signs of improvement which is good  news. He is currently still on the Dapsone. He is also working on tapering the prednisone to get off of this and Dr. Lottie Rater is working with him in this regard. Nonetheless overall I feel like the wound is doing well it does appear based on the infectious disease note that I reviewed from Dr. 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 I did also review the results of his ultrasound of left lower extremity which revealed there was a dentist tissue in the base of the wound without an abscess noted. 04/06/18 on evaluation today the patient's left lateral lower extremity ulcer actually appears to be doing fairly well which is excellent news. There does not appear to be any evidence of infection at this time which is also great news. Overall he still does have a significantly large ulceration although little by little he seems to be making progress. He is down to 10 mg a day of the prednisone. 04/20/18 on evaluation today patient actually appears to be doing excellent at this time in regard to his left lower extremity ulcer. He's making signs of good progress unfortunately this is taking much longer than we would really like to see but nonetheless he is making progress. Fortunately there does not appear to be any evidence of infection at this time. No fevers, chills, nausea, or vomiting noted at this time. The patient has not been using the Santyl due to the cost he hadn't got in this field yet. He's mainly been using the antibiotic ointment topically. Subsequently he also tells me that he really has not been scrubbing in the shower I think this would be helpful again as I told him it doesn't have to be anything too aggressive to even make it believe just enough to keep it free of some of the loose slough and biofilm on the wound surface. 05/11/18 on evaluation today patient's wound appears to  be making slow but sure progress in regard to the left lateral lower extremity ulcer. He is been tolerating the dressing changes without complication. Fortunately there does not appear to be any evidence of infection at this time. He is still just using triple antibiotic ointment along with clobetasol occasionally over the area. He never got the Santyl and really does not seem to intend to in my pinion. 06/01/18 on evaluation today patient appears to be doing a little better in regard to his left lateral lower extremity ulcer. He states that overall he does not feel like he is doing as well with the Dapsone as he did with the prednisone. Nonetheless he sees his dermatologist later today and is gonna talk to them about the possibility of going back on the prednisone. Overall again I believe that the wound would be better if you would utilize Santyl but he really does not seem to be interested in going back to the [place] at this point. He has been using triple antibiotic ointment. 06/15/18 on evaluation today patient's wound actually appears to be doing about the same at this point. Fortunately there is no signs of infection at this time. He has made slight improvements although he continues to not really want to clean the wound bed at this point. He states that he just doesn't mess with it he doesn't want to cause any problems with everything else he has going on. He has been on medication, antibiotics as prescribed by his dermatologist, for a staff infection of his lower extremities which is really drying out now and looking much better he tells me. Fortunately there is no sign of overall infection. 06/29/18 on evaluation today patient appears to be doing well in regard to his left lateral lower surety ulcer all things considering. Fortunately his staff infection seems to be greatly improved compared to previous. He has no signs of infection and this is drying up quite nicely.  He is still the doxycycline  for this is no longer on cental, Dapsone, or any of the other medications. His dermatologist has recommended possibility of an infusion but right now he does not want to proceed with that. 07/13/18 on evaluation today patient appears to be doing about the same in regard to his left lateral lower surety ulcer. Fortunately there's no signs of infection at this time which is great news. Unfortunately he still builds up a significant amount of Slough/biofilm of the surface of the wound he still is not really cleaning this as he should be appropriately. Again I'm able to easily with saline and gauze remove the majority of this on the surface which if you would do this at home would likely be a dramatic improvement for him as far as getting the area to improve. Nonetheless overall I still feel like he is making progress is just very slow. I think Santyl will be of benefit for him as well. Still he has not gotten this as of this point. 07/27/18 on evaluation today patient actually appears to be doing little worse in regards of the erythema around the periwound region of the wound he also tells me that he's been having more drainage currently compared to what he was experiencing last time I saw him. He states not quite as bad as what he had because this was infected previously but nonetheless is still appears to be doing poorly. Fortunately there is no evidence of systemic infection at this point. The patient tells me that he is not going to be able to afford the Santyl. He is still waiting to hear about the infusion therapy with his dermatologist. Apparently she wants an updated colonoscopy first. [name], [name] ([id number]) 08/10/18 on evaluation today patient appears to be doing better in regard to his left lateral lower extremity ulcer. Fortunately he is showing signs of improvement in this regard he's actually been approved for Remicade infusion's as well although this has not been scheduled as of  yet. Fortunately there's no signs of active infection at this time in regard to the wound although he is having some issues with infection of the right lower extremity is been seen as dermatologist for this. Fortunately they are definitely still working with him trying to keep things under control. 09/07/18 on evaluation today patient is actually doing rather well in regard to his left lateral lower extremity ulcer. He notes these actually having some hair grow back on his extremity which is something he has not seen in years. He also tells me that the pain is really not giving them any trouble at this time which is also good news overall she is very pleased with the progress he's using a combination of the mupirocin along with the probate is all mixed. 09/21/18 on evaluation today patient actually appears to be doing fairly well all things considered in regard to his looks from the ulcer. He's been tolerating the dressing changes without complication. Fortunately there's no signs of active infection at this time which is good news he is still on all antibiotics or prevention of the staff infection. He has been on prednisone for time although he states it is gonna contact his dermatologist and see if she put them on a short course due to some irritation that he has going on currently. Fortunately there's no evidence of any overall worsening this is going very slow I think cental would be something that would be helpful for him although he states that $  50 for tube is quite expensive. He therefore is not willing to get that at this point. 10/06/18 on evaluation today patient actually appears to be doing decently well in regard to his left lateral leg ulcer. He's been tolerating the dressing changes without complication. Fortunately there's no signs of active infection at this time. Overall I'm actually rather pleased with the progress he's making although it's slow he doesn't show any signs of infection and  he does seem to be making some improvement. I do believe that he may need a switch up and dressings to try to help this to heal more appropriately and quickly. 10/19/18 on evaluation today patient actually appears to be doing better in regard to his left lateral lower extremity ulcer. This is shown signs of having much less Slough buildup at this point due to the fact he has been using the [employer]. Obviously this is very good news. The overall size of the wound is not dramatically smaller but again the appearance is. 11/02/18 on evaluation today patient actually appears to be doing quite well in regard to his lower [place] ulcer. A lot of the skin around the ulcer is actually somewhat irritating at this point this seems to be more due to the dressing causing irritation from the adhesive that anything else. Fortunately there is no signs of active infection at this time. 11/24/18 on evaluation today patient appears to be doing a little worse in regard to his overall appearance of his lower extremity ulcer. There's more erythema and warmth around the wound unfortunately. He is currently on doxycycline which he has been on for some time. With that being said I'm not sure that seems to be helping with what appears to possibly be an acute cellulitis with regard to his left lower extremity ulcer. No fevers, chills, nausea, or vomiting noted at this time. 12/08/18 on evaluation today patient's wounds actually appears to be doing significantly better compared to his last evaluation. He has been using Santyl along with alternating tripling about appointment as well as the steroid cream seems to be doing quite well and the wound is showing signs of improvement which is excellent news. Fortunately there's no evidence of infection and in fact his culture came back negative with only normal skin flora noted. 12/21/2018 upon evaluation today patient actually appears to be doing excellent with regard to his ulcer. This is  actually the best that I have seen it since have been helping to take care of him. It is both smaller as well as less slough noted on the surface of the wound and seems to be showing signs of good improvement with new skin growing from the edges. He has been using just the triamcinolone he does wonder if he can get a refill of that ointment today. 01/04/2019 upon evaluation today patient actually appears to be doing well with regard to his left lateral lower extremity ulcer. With that being said it does not appear to be that he is doing quite as well as last time as far as progression is concerned. There does not appear to be any signs of infection or significant irritation which is good news. With that being said I do believe that he may benefit from switching to a collagen based dressing based on how clean The wound appears. 01/18/2019 on evaluation today patient actually appears to be doing well with regard to his wound on the left lower extremity. He is not made a lot of progress compared to where we  were previous but nonetheless does seem to be doing okay at this time which is good news. There is no signs of active infection which is also good news. My only concern currently is I do wish we can get him into utilizing the collagen dressing his insurance would not pay for the supplies that we ordered although it appears that he may be able to order this through his supply company that he typically utilizes. This is [employer]. Nonetheless he did try to order it during the office visit today and it appears this did go through. We will see if he can get that it is a different brand but nonetheless he has collagen and I do think will be beneficial. 02/01/2019 on evaluation today patient actually appears to be doing a little worse today in regard to the overall size of his wounds. Fortunately there is no signs of active infection at this time. That is visually. Nonetheless when this is happened before it  was due to infection. For that reason were somewhat concerned about that this time as well. 02/08/2019 on evaluation today patient unfortunately appears to be doing slightly worse with regard to his wound upon evaluation today. Is measuring a little deeper and a little larger unfortunately. I am not really sure exactly what is causing this to enlarge he actually did see his dermatologist she is going to see about initiating Humira for him. Subsequently she also did do steroid injections into the wound itself in the periphery. Nonetheless still nonetheless he seems to be getting a little bit larger he is gone back to just using the steroid cream topically which I think is appropriate. I would say hold off on the collagen for the time being is definitely a good thing to do. Based on the culture results which we finally did get the final result back regarding it shows staph as the bacteria noted again that can be a normal skin bacteria based on the fact however he is having increased drainage and worsening of the wound measurement wise I would go ahead and place him on an antibiotic today I do believe for this. 02/15/2019 on evaluation today patient actually appears to be doing somewhat better in regard to his ulcer. There is no signs of worsening at this time I did review his culture results which showed evidence of Staphylococcus aureus but not MRSA. Again this could just be more related to the normal skin bacteria although he states the drainage has slowed down quite a bit he may have had a mild infection not just colonization. And was much smaller and then since around10/04/2019 on evaluation today patient appears to be doing unfortunately worse as far as the size of the wound. I really feel like that this is steadily getting larger again it had been doing excellent right at the beginning of September we have seen a steady increase in the area of the wound it is almost 2-1/2 times the size it was on  September 1. Obviously this is a bad trend this is not wanting to see. For that reason we went back to using just the topical triamcinolone cream which does seem to help with inflammation. I checked him for bacteria by way of culture and nothing showed positive there. I am considering giving him a short course of a tapering steroid Mitesh Rosendahl, Donald ([id number]) today to see if that is can be beneficial for him. The patient is in agreement with giving that a try. 03/08/2019 on evaluation today  patient appears to be doing very well in comparison to last evaluation with regard to his lower extremity ulcer. This is showing signs of less inflammation and actually measuring slightly smaller compared to last time every other week over the past month and a half he has been measuring larger larger larger. Nonetheless I do believe that the issue has been inflammation the prednisone does seem to have been beneficial for him which is good news. No fevers, chills, nausea, vomiting, or diarrhea. 03/22/2019 on evaluation today patient appears to be doing about the same with regard to his leg ulcer. He has been tolerating the dressing changes without complication. With that being said the wound seems to be mostly arrested at its current size but really is not making any progress except for when we prescribed the prednisone. He did show some signs of dropping as far as the overall size of the wound during that interval week. Nonetheless this is something he is not on long-term at this point and unfortunately I think he is getting need either this or else the Humira which his dermatologist has discussed try to get approval for. With that being said he will be seeing his dermatologist on the 11th of this month that is November. 04/19/2019 on evaluation today patient appears to be doing really about the same the wound is measuring slightly larger compared to last time I saw him. He has not been into the office since  November 2 due to the fact that he unfortunately had Covid as that his entire family. He tells me that it was rough but they did pull-through and he seems to be doing much better. Fortunately there is no signs of active infection at this time. No fevers, chills, nausea, vomiting, or diarrhea. 05/10/2019 on evaluation today patient unfortunately appears to be doing significantly worse as compared to last time I saw him. He does tell me that he has had his first dose of Humira and actually is scheduled to get the next one in the upcoming week. With that being said he tells me also that in the past several days he has been having a lot of issues with green drainage she showed me a picture this is more blue-green in color. He is also been having issues with increased sloughy buildup and the wound does appear to be larger today. Obviously this is not the direction that we want everything to take based on the starting of his Humira. Nonetheless I think this is definitely a result of likely infection and to be honest I think this is probably Pseudomonas causing the infection based on what I am seeing. 05/24/2019 on evaluation today patient unfortunately appears to be doing significantly worse compared to his prior evaluation with me 2 weeks ago. I did review his culture results which showed that he does have Staph aureus as well as Pseudomonas noted on the culture. Nonetheless the Levaquin that I prescribed for him does not appear to have been appropriate and in fact he tells me he is no longer experiencing the green drainage and discharge that he had at the last visit. Fortunately there is no signs of active infection at this time which is good news although the wound has significantly worsened it in fact is much deeper than it was previous. We have been utilizing up to this point triamcinolone ointment as the prescription topical of choice but at this time I really feel like that the wound is getting need to  be packed in  order to appropriately manage this due to the deeper nature of the wound. Therefore something along the lines of an alginate dressing may be more appropriate. 05/31/2019 upon inspection today patient's wound actually showed signs of doing poorly at this point. Unfortunately he just does not seem to be making any good progress despite what we have tried. He actually did go ahead and pick up the Cipro and start taking that as he was noticing more green drainage he had previously completed the Levaquin that I prescribed for him as well. Nonetheless he missed his appointment for the seventh last week on Wednesday with the wound care center and [facility] where his dermatologist referred him. Obviously I do think a second opinion would be helpful at this point especially in light of the fact that the patient seems to be doing so poorly despite the fact that we have tried everything that I really know how at this point. The only thing that ever seems to have helped him in the past is when he was on high doses of continual steroids that did seem to make a difference for him. 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 06/07/2019 upon evaluation today patient unfortunately appears to be doing worse yet again with regard to his wound. In fact I am starting to question whether or not he may have a fluid pocket in the muscle at this point based on the bulging and the soft appearance to the central portion of the muscle area. There is not anything draining from the muscle itself at this time which is good news but nonetheless the wound is expanding. I am not really seeing any results of the Humira as far as overall wound progression based on what I am seeing at this point. The patient has been referred for second opinion with regard to his  wound to the [facility] wound care center by his dermatologist which I definitely am not in opposition to. Unfortunately we tried multiple dressings in the past including collagen, alginate, and at one point even Hydrofera Blue. With that being said he is never really used it for any significant amount of time due to the fact that he often complains of pain associated with these dressings and then will go back to either using the Santyl which she has done intermittently or more frequently the triamcinolone. He is also using his own compression stockings. We have wrapped him in the past but again that was something else that he really was not a big fan of. 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 He seemed to do much better when he was on the steroids although honestly I understand that the steroids are not necessarily the best medication to be on long-term obviously 06/14/2019 on evaluation today patient appears to be doing actually a little bit better with regard to the overall appearance with his leg. Unfortunately he does continue to have issues with what appears to be some  fluid underneath the muscle although he did see the wound specialty center at [facility] last week their main goals were to see about infusion therapy in place of the Humira as they feel like that is not quite strong enough. They also recommended that we continue with the treatment otherwise as we are they felt like that was appropriate and they are okay with him continuing to follow-up here with [place] in that regard. With that being said they are also sending him to the vein specialist there to see about vein stripping and  if that would be of benefit for him. Subsequently they also did not really address whether or not an ultrasound of the muscle area to see if there is anything that needs to be addressed here would be appropriate or not. For that reason I discussed this with him last week I think we may proceed down that road at this point. 06/21/2019 upon evaluation today patient's wound actually appears to be doing slightly better compared to previous evaluations. I do believe that he has made a difference with regard to the progression here with the use of oral steroids. Again in the past has been the only thing that is really calm things down. He does tell me that from [facility] is gotten a good news from there that there are no further vein stripping that is necessary at this point. I do not have that available for review today although the patient did relay this to me. He also did obtain and have the ultrasound of the wound completed which I did sign off on today. It does appear that there is no fluid collection under the muscle this is likely then just edematous tissue in general. That is also good news. Overall I still believe the inflammation is the main issue here. He did inquire about the possibility of a wound VAC again with the muscle protruding like it is I am not really sure whether the wound VAC is necessarily ideal or not. That is something we will have to consider although I do believe he may need compression wrapping to try to help with edema control which could potentially be of benefit. 06/28/2019 on evaluation today patient appears to be doing slightly better measurement wise although this is not terribly smaller he least seems to be trending towards that direction. With that being said he still seems to have purulent drainage noted in the wound bed at this time. He has been on Levaquin followed by Cipro over the past month. Unfortunately he still seems to have some issues with active infection at this time.  I did perform a culture last week in order to evaluate and see if indeed there was still anything going on. Subsequently the culture did come back Follansbee, Dai ([id number]) showing Pseudomonas which is consistent with the drainage has been having which is blue-green in color. He also has had an odor that again was somewhat consistent with Pseudomonas as well. Long story short it appears that the culture showed an intermediate finding with regard to how well the Cipro will work for the Pseudomonas infection. Subsequently being that he does not seem to be clearing up and at best what we are doing is just keeping this at [place] I think he may need to see infectious disease to discuss IV antibiotic options. 07/05/2019 upon evaluation today patient appears to be doing okay in regard to his leg ulcer. He has been tolerating the dressing changes at this point without complication. Fortunately there  is no signs of active infection at this time which is good news. No fevers, chills, nausea, vomiting, or diarrhea. With that being said he does have an appointment with infectious disease tomorrow and his primary care on Wednesday. Again the reason for the infectious disease referral was due to the fact that he did not seem to be fully resolving with the use of oral antibiotics and therefore we were thinking that IV antibiotic therapy may be necessary secondary to the fact that there was an intermediate finding for how effective the Cipro may be. Nonetheless again he has been having a lot of purulent and even green drainage. Fortunately right now that seems to have calmed down over the past week with the reinitiation of the oral antibiotic. Nonetheless we will see what Dr. Megan Salon has to say. 07/12/2019 upon evaluation today patient appears to be doing about the same at this point in regard to his left lower extremity ulcer. Fortunately there is no signs of active infection at this time which is good news I do believe  the Levaquin has been beneficial I did review Dr. Hale Bogus note and to be honest I agree that the patient's leg does appear to be doing better currently. What we found in the past as he does not seem to really completely resolve he will stop the antibiotic and then subsequently things will revert back to having issues with blue-green drainage, increased pain, and overall worsening in general. Obviously that is the reason I sent him back to infectious disease. 07/19/2019 upon evaluation today patient appears to be doing roughly the same in size there is really no dramatic improvement. He has started back on the Levaquin at this point and though he seems to be doing okay he did still have a lot of blue/green drainage noted on evaluation today unfortunately. I think that this is still indicative more likely of a Pseudomonas infection as previously noted and again he does see Dr. Megan Salon in just a couple of days. I do not know that were really able to effectively clear this with just oral antibiotics alone based on what I am seeing currently. Nonetheless we are still continue to try to manage as best we can with regard to the patient and his wound. I do think the wrap was helpful in decreasing the edema which is excellent news. No fevers, chills, nausea, vomiting, or diarrhea. 07/26/2019 upon evaluation today patient appears to be doing slightly better with regard to the overall appearance of the muscle there is no dark discoloration centrally. Fortunately there is no signs of active infection at this time. No fevers, chills, nausea, vomiting, or diarrhea. Patient's wound bed currently the patient did have an appointment with Dr. Megan Salon at infectious disease last week. With that being said Dr. Megan Salon the patient states was still somewhat hesitant about put him on any IV antibiotics he wanted [place] to repeat cultures today and then see where things go going forward. He does look like Dr. Megan Salon because of  some improvement the patient did have with the Levaquin wanted [place] to see about repeating cultures. If it indeed grows the Pseudomonas again then he recommended a possibility of considering a PICC line placement and IV antibiotic therapy. He plans to see the patient back in 1 to 2 weeks. 08/02/2019 upon evaluation today patient appears to be doing poorly with regard to his left lower extremity. We did get the results of his culture back it shows that he is still showing evidence  of Pseudomonas which is consistent with the purulent/blue-green drainage that he has currently. Subsequently the culture also shows that he now is showing resistance to the oral fluoroquinolones which is unfortunate as that was really the only thing to treat the infection prior. I do believe that he is looking like this is going require IV antibiotic therapy to get this under control. Fortunately there is no signs of systemic infection at this time which is good news. The patient does see Dr. Megan Salon tomorrow. 08/09/2019 upon evaluation today patient appears to be doing better with regard to his left lower extremity ulcer in regard to the overall appearance. He is currently on IV antibiotic therapy. As ordered by Dr. Megan Salon. Currently the patient is on ceftazidime which she is going to take for the next 2 weeks and then follow-up for 4 to 5-week appointment with Dr. Megan Salon. The patient started this this past Friday symptoms have not for a total of 3 days currently in full. 08/16/2019 upon evaluation today patient's wound actually does show muscle in the base of the wound but in general does appear to be much better as far as the overall evidence of infection is concerned. In fact I feel like this is for the most part cleared up he still on the IV antibiotics he has not completed the full course yet but I think he is doing much better which is excellent news. 08/23/2019 upon evaluation today patient appears to be doing about the  same with regard to his wound at this point. He tells me that he still has pain unfortunately. Fortunately there is no evidence of systemic infection at this time which is great news. There is significant muscle protrusion. 09/13/19 upon evaluation today patient appears to be doing about the same in regard to his leg unfortunately. He still has a lot of drainage coming from the ulceration there is still muscle exposed. With that being said the patient's last wound culture still showed an intermediate finding with regard to the Pseudomonas he still having the bluish/green drainage as well. Overall I do not know that the wound has completely cleared of infection at this point. Fortunately there is no signs of active infection systemically at this point which is good news. 09/20/2019 upon evaluation today patient's wound actually appears to be doing about the same based on what I am seeing currently. I do not see any signs of systemic infection he still does have evidence of some local infection and drainage. He did see Dr. Megan Salon last week and Dr. Megan Salon states that he probably does need a different IV antibiotic although he does not want to put him on this until the patient begins the Remicade infusion which is actually scheduled for about 10 days out from today on 13 May. Following that time Dr. Megan Salon is good to see him back and then will evaluate the feasibility of starting him on the IV antibiotic therapy once again at that point. I do not disagree with this plan I do believe as Dr. Megan Salon stated in his note that I reviewed today that the patient's issue is multifactorial with the pyoderma being 1 aspect of this that were hoping the Remicade will be helpful for her. In the meantime I think the gentamicin is, helping to keep things under decent okay control in regard to the ulcer. 09/27/2019 upon evaluation today patient appears to be doing about the same with regard to his wound still there is a  lot of muscle exposure though  he does have some hyper granulation tissue noted around the edge and actually some granulation tissue starting to form over the muscle which is actually good news. Fortunately there is no evidence of active infection which is also good news. His pain is less at this point. 5/21; this is a patient I have not seen in a long time. 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 He has a large wound on the left lateral leg with protruding muscle. He comes in the clinic today showing the same area on his left medial ankle. He says there is been a spot there for some time although we have not previously defined this. Today he has a clearly defined area with slight amount of skin breakdown surrounded by raised areas with a purplish hue in color. This is not painful he says it is irritated. 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 He still has muscle protruding from the lateral portion of his left leg but fortunately the new area that they were concerned about at his last visit does not appear to have opened at this point. He is currently on Remicade infusions and seems to be doing better in my opinion in fact the wound itself seems to be overall much better. The purplish discoloration that he did have seems to have resolved and I think that is a good sign that hopefully the Remicade is doing its job. He does Buchan, Lem ([id number]) have some biofilm noted over the surface of the wound. 11/01/2019 on evaluation today patient's wound actually appears to be doing excellent at this time. Fortunately there is no evidence of active infection and overall I feel like he is making great progress. The Remicade seems to be due excellent job in my opinion. 11/08/19 evaluation today vision actually appears to be doing quite well with regard to his  weight ulcer. He's been tolerating dressing changes without complication. Fortunately there is no evidence of infection. No fevers, chills, nausea, or vomiting noted at this time. Overall states that is having more itching than pain which is actually a good sign in my opinion. 12/13/2019 upon evaluation today patient appears to be doing well today with regard to his wound. He has been tolerating the dressing changes without complication. Fortunately there is no sign of active infection at this time. No fevers, chills, nausea, vomiting, or diarrhea. Overall I feel like the infusion therapy has been very beneficial for him. 01/06/2020 on evaluation today patient appears to be doing well with regard to his wound. This is measuring smaller and actually looks to be doing better. Fortunately there is no signs of active infection at this point. No fevers, chills, nausea, vomiting, or diarrhea. With that being said he does still have the blue-green drainage but this does not seem to be causing any significant issues currently. He has been using the gentamicin that does seem to be keeping things under decent control at this point. He goes later this morning for his next infusion therapy for the pyoderma which seems to also be very beneficial. 02/07/2020 on evaluation today patient appears to be doing about the same in regard to his wounds currently. Fortunately there is no signs of active infection systemically he does still have evidence of local infection still using gentamicin. He also is showing some signs of improvement albeit slowly I do feel like we are making some progress here. 02/21/2020  upon evaluation today patient appears to be making some signs of improvement the wound is measuring a little bit smaller which is great news and overall I am very pleased with where he stands currently. He is going to be having infusion therapy treatment on the 15th of this month. Fortunately there is no signs of active  infection at this time. 03/13/2020 I do believe patient's wound is actually showing some signs of improvement here which is great news. He has continue with the infusion therapy through rheumatology/dermatology at [facility]. That does seem to be beneficial. I still think he gets as much benefit from this as he did from the prednisone initially but nonetheless obviously this is less harsh on his body that the prednisone as far as they are concerned. 03/31/2020 on evaluation today patient's wound actually showing signs of some pretty good improvement in regard to the overall appearance of the wound bed. There is still muscle exposed though he does have some epithelial growth around the edges of the wound. Fortunately there is no signs of active infection at this time. No fevers, chills, nausea, vomiting, or diarrhea. 04/24/2020 upon evaluation today patient appears to be doing about the same in regard to his leg ulcer. He has been tolerating the dressing changes without complication. Fortunately there is no signs of active infection at this time. No fevers, chills, nausea, vomiting, or diarrhea. With that being said he still has a lot of irritation from the bandaging around the edges of the wound. We did discuss today the possibility of a referral to plastic surgery. 05/22/2020 on evaluation today patient appears to be doing well with regard to his wounds all things considered. He has not been able to get the Chantix apparently there is a recall nurse that I was unaware of put out by [employer] involuntarily. Nonetheless for now I am and I have to do some research into what may be the best option for him to help with quitting in regard to smoking and we discussed that today. 06/26/2020 upon evaluation today patient appears to be doing well with regard to his wound from the standpoint of infection I do not see any signs of infection at this point. With that being said unfortunately he is still continuing to have issues  with muscle exposure and again he is not having a whole lot of new skin growth unfortunately. There does not appear to be any signs of active infection at this time. No fevers, chills, nausea, vomiting, or diarrhea. 07/10/2020 upon evaluation today patient appears to be doing a little bit more poorly currently compared to where he was previous. I am concerned currently about an active infection that may be getting worse especially in light of the increased size and tenderness of the wound bed. No fevers, chills, nausea, vomiting, or diarrhea. 07/24/2020 upon evaluation today patient appears to be doing poorly in regard to his leg ulcer. He has been tolerating the dressing changes without complication but unfortunately is having a lot of discomfort. 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 Subsequently I think he is going require possibly IV antibiotics to get this under control. I am very concerned about the severity of his infection and the amount of discomfort he is having. 07/31/2020 upon evaluation today patient appears to be doing about the same in regard to his leg wound. He did see Dr. Megan Salon and Dr. Megan Salon is actually going to start him on IV antibiotics. He  goes for the PICC line tomorrow. With that being said there do not have that run for 2 weeks and then see how things are doing and depending on how he is progressing they may extend that a little longer. Nonetheless I am glad this is getting ready to be in place and definitely feel it may help the patient. In the meantime is been using mainly triamcinolone to the wound bed has an anti-inflammatory. 08/07/2020 on evaluation today patient appears to be doing well with regard to his wound compared even last week. In the interim he has gotten the PICC line placed and overall this seems to be doing excellent. There does not appear to be any evidence of infection which is  great news systemically although locally of course has had the infection this appears to be improving with the use of the antibiotics. 08/14/2020 upon evaluation today patient's wound actually showing signs of excellent improvement. Overall the irritation has significantly improved the drainage is back down to more of a normal level and his pain is really pretty much nonexistent compared to what it was. Obviously I think that this is significantly improved secondary to the IV antibiotic therapy which has made all the difference in the world. Again he had a resistant form of Pseudomonas for which oral antibiotics just was not cutting it. Nonetheless I do think that still we need to consider the possibility of a surgical closure for this wound is been open so long and to be honest with muscle exposed I think this can be very hard to get this to close outside of this although definitely were still working to try to do what we can in that regard. 08/21/2020 upon evaluation today patient appears to be doing very well with regard to his wounds on the left lateral lower extremity/calf area. Fortunately there does not appear to be signs of active infection which is great news and overall very pleased with where things stand today. He is actually wrapping up his treatment with IV antibiotics tomorrow. After that we will see where things go from there. 08/28/2020 upon evaluation today patient appears to be doing decently well with regard to his leg ulcer. There does not appear to be any signs of Grider, Stepfon ([id number]) active infection which is great news and overall very pleased with where things stand today. No fevers, chills, nausea, vomiting, or diarrhea. 09/18/2020 upon evaluation today patient appears to be doing well with regard to his infection which I feel like is better. Unfortunately he is not doing as well with regard to the overall size of the wound which is not nearly as good at this point. I feel  like that he may be having an issue here with the pyoderma being somewhat out of control. I think that he may benefit from potentially going back and talking to the dermatologist about what to do from the pyoderma standpoint. I am not certain if the infusions are helping nearly as much is what the prednisone did in the past. 10/02/2020 upon evaluation today patient appears to be doing well with regard to his leg ulcer. He did go to the Psychiatric nurse. Unfortunately they feel like there is a 10% chance that most that he would be able to heal and that the skin graft would take. Obviously this has led him to not be able to go down that path as far as treatment is concerned. Nonetheless he does seem to be doing a little bit better with the prednisone  that I gave him last time. I think that he may need to discuss with dermatology the possibility of long-term prednisone as that seems to be what is most helpful for him to be perfectly honest. I am not sure the Remicade is really doing the job. 10/17/2020 upon evaluation today patient appears to be doing a little better in regard to his wound. In fact the case has been since we did the prednisone on May 2 for him that we have noticed a little bit of improvement each time we have seen a size wise as well as appearance wise as well as pain wise. I think the prednisone has had a greater effect then the infusion therapy has to be perfectly honest. With that being said the patient also feels significantly better compared to what he was previous. All of this is good news but nonetheless I am still concerned about the fact that again we are really not set up to long-term manage him as far as prednisone is concerned. Obviously there are things that you need to be watched I completely understand the risk of prednisone usage as well. 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 With all that being said I do believe that we can  give him another round of the prednisone which she is requesting today because of the improvement that he seen since we did that first round. Objective Constitutional Obese and well-hydrated in no acute distress. Vitals Time Taken: 8:36 AM, Height: 71 in, Weight: 338 lbs, BMI: 47.1, Temperature: 98.5 F, Pulse: 79 bpm, Respiratory Rate: 16 breaths/min, Blood Pressure: 152/81 mmHg. Respiratory normal breathing without difficulty. Psychiatric this patient is able to make decisions and demonstrates good insight into disease process. Alert and Oriented x 3. pleasant and cooperative. General Notes: Upon inspection patient's wound bed actually showed signs of good granulation and some areas he had some good epithelial growth around the edges of the wound as well. With that being said this is still a significant wound that is going to take quite a bit of time to heal. He still smoking as well we discussed this multiple visits he tells me there is just too much stress right now for him to consider quitting. Integumentary (Hair, Skin) Wound #1 status is Open. Original cause of wound was Gradually Appeared. The date acquired was: 11/18/2015. The wound has been in treatment 201 weeks. The wound is located on the Left,Lateral Lower Leg. The wound measures 6.2cm length x 7cm width x 0.9cm depth; 34.086cm^2 area and 30.678cm^3 volume. There is muscle and Fat Layer (Subcutaneous Tissue) exposed. There is no tunneling or undermining noted. There is a medium amount of serosanguineous drainage noted. The wound margin is epibole. There is medium (34-66%) red, pink granulation within the wound bed. There is a medium (34-66%) amount of necrotic tissue within the wound bed including Adherent Slough and Necrosis of Muscle. 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 Bathing/ Shower/ Hygiene: Clean wound with Normal Saline or wound cleanser. The following medication(s) was prescribed: prednisone oral 50 mg tablet 1 1 tablet oral taken 1 time per day in the morning with food x 7 days. Do not take  ibuprofen or Aleve with this medication starting 10/17/2020 WOUND #1: - Lower Leg Wound Laterality: Left, Lateral Cleanser: Normal Saline 1 x Per Day/30 Days Discharge Instructions: Wash your hands with soap and water. Remove old dressing, discard into plastic bag and place into trash. Cleanse the wound with Normal Saline prior to applying a clean dressing using gauze sponges, not tissues or cotton balls. Do not scrub or use excessive force. Pat dry using gauze sponges, not tissue or cotton balls. Topical: Triamcinolone Acetonide Cream, 0.1%, 15 (g) tube 1 x Per Day/30 Days Discharge Instructions: Apply as directed by provider. Secondary Dressing: Mepilex Border Flex, 6x6 (in/in) 1 x Per Day/30 Days Discharge Instructions: Apply to wound as directed. Do not cut. Secured With: Tubigrip Size F, 4x10 (in/yd) 1 x Per Day/30 Days Discharge Instructions: Apply double layer Tubigrip F 3 finger-widths below knee to base of toes to secure dressing and/or for swelling. 1. Would recommend currently that we go ahead and continue with the topical triamcinolone I feel like this is doing a fairly good job for him all things considered. 2. I am also can recommend that we see about using a repeat oral prednisone dosage in order to try and see if we can calm down a bit more the inflammation. He did definitely see quite a bit of improvement after the first time we did this in the beginning of April. Since then I think that were fine to do that 1 additional  time and see where we stand. For that reason I did send in the prednisone for him for 1 week. 3. He is going to be discussing with the dermatologist next week for the infusion therapy and the fact that he does not really feel like that is helping versus potentially going back on prednisone to see if that could be of benefit long-term. We will see what they have to say at that time as well. We will see patient back for reevaluation in 2 weeks here in the clinic. If anything worsens or changes patient will contact our office for additional recommendations. Electronic Signature(s) Signed: 10/17/2020 9:06:30 AM By: Worthy Keeler PA-C Entered By: Worthy Keeler on 10/17/2020 09:06:30 [name] ([id number]) -------------------------------------------------------------------------------- SuperBill Details Patient Name: [name] Date of Service: 10/17/2020 Medical Record Number: [id number] Patient Account Number: [id number] Date of Birth/Sex: Dec 11, 1980 (40 y.o. M) Treating RN: Dolan Amen Primary Care Provider: [name] Other Clinician: Referring Provider: [name] Treating Provider/Extender: [name] Cliche in Treatment: 201 Diagnosis Coding ICD-10 Codes Code Description [phone] Non-pressure chronic ulcer of left calf with fat layer exposed L88 Pyoderma gangrenosum L97.321 Non-pressure chronic ulcer of left ankle limited to breakdown of skin I87.2 Venous insufficiency (chronic) (peripheral) L03.116 Cellulitis of left lower limb F17.208 Nicotine dependence, unspecified, with other nicotine-induced disorders Facility Procedures CPT4 Code: [id number] Description: 99213 - WOUND CARE VISIT-LEV 3 EST PT Modifier: Quantity: 1 Physician Procedures CPT4 Code: 3953202 Description: 33435 - WC PHYS LEVEL 4 - EST PT Modifier: Quantity: 1 CPT4 Code: Description: ICD-10 Diagnosis Description L97.222 Non-pressure chronic ulcer of left calf with fat layer exposed L88  Pyoderma gangrenosum L97.321 Non-pressure chronic ulcer of left ankle limited to breakdown of skin I87.2 Venous insufficiency (chronic)  (peripheral) Modifier: Quantity: Electronic Signature(s) Signed: 10/17/2020 9:06:43 AM By: Worthy Keeler PA-C Entered By: Worthy Keeler on 10/17/2020 09:06:43

## 2020-10-17 NOTE — Progress Notes (Addendum)
TREMEL, SETTERS ([id number]) Visit Report for 10/17/2020 Arrival Information Details Patient Name: [name] Date of Service: 10/17/2020 8:15 AM Medical Record Number: [id number] Patient Account Number: [id number] Date of Birth/Sex: 1980/09/02 (40 y.o. M) Treating RN: Donnamarie Poag Primary Care Stein Windhorst: [name] Other Clinician: Referring Shahram Alexopoulos: [name] Treating Corney Knighton/Extender: [name] Cliche in Treatment: 79 Visit Information History Since Last Visit Added or deleted any medications: No Patient Arrived: Ambulatory Had a fall or experienced change in No Arrival Time: 08:35 activities of daily living that may affect Accompanied By: self risk of falls: Transfer Assistance: None Hospitalized since last visit: No Patient Identification Verified: Yes Has Dressing in Place as Prescribed: Yes Secondary Verification Process Completed: Yes Pain Present Now: No Patient Requires Transmission-Based Precautions: No Patient Has Alerts: Yes Electronic Signature(s) Signed: 10/17/2020 1:46:03 PM By: Donnamarie Poag Entered By: Donnamarie Poag on 10/17/2020 08:35:35 [name] ([id number]) -------------------------------------------------------------------------------- Clinic Level of Care Assessment Details Patient Name: [name] Date of Service: 10/17/2020 8:15 AM Medical Record Number: [id number] Patient Account Number: [id number] Date of Birth/Sex: May 12, 1981 (40 y.o. M) Treating RN: Dolan Amen Primary Care Kyren Vaux: [name] Other Clinician: Referring Azam Gervasi: [name] Treating Darin Arndt/Extender: [name] Cliche in Treatment: 201 Clinic Level of Care Assessment Items TOOL 4 Quantity Score X - Use when only an EandM is performed on FOLLOW-UP visit 1 0 ASSESSMENTS - Nursing Assessment / Reassessment X - Reassessment of Co-morbidities (includes updates in patient status) 1 10 X- 1 5 Reassessment of Adherence to Treatment Plan ASSESSMENTS -  Wound and Skin Assessment / Reassessment X - Simple Wound Assessment / Reassessment - one wound 1 5 []  - 0 Complex Wound Assessment / Reassessment - multiple wounds []  - 0 Dermatologic / Skin Assessment (not related to wound area) ASSESSMENTS - Focused Assessment []  - Circumferential Edema Measurements - multi extremities 0 []  - 0 Nutritional Assessment / Counseling / Intervention []  - 0 Lower Extremity Assessment (monofilament, tuning fork, pulses) []  - 0 Peripheral Arterial Disease Assessment (using hand held doppler) ASSESSMENTS - Ostomy and/or Continence Assessment and Care []  - Incontinence Assessment and Management 0 []  - 0 Ostomy Care Assessment and Management (repouching, etc.) PROCESS - Coordination of Care X - Simple Patient / Family Education for ongoing care 1 15 []  - 0 Complex (extensive) Patient / Family Education for ongoing care []  - 0 Staff obtains Programmer, systems, Records, Test Results / Process Orders []  - 0 Staff telephones HHA, Nursing Homes / Clarify orders / etc []  - 0 Routine Transfer to another Facility (non-emergent condition) []  - 0 Routine Hospital Admission (non-emergent condition) []  - 0 New Admissions / Biomedical engineer / Ordering NPWT, Apligraf, etc. []  - 0 Emergency Hospital Admission (emergent condition) X- 1 10 Simple Discharge Coordination []  - 0 Complex (extensive) Discharge Coordination PROCESS - Special Needs []  - Pediatric / Minor Patient Management 0 []  - 0 Isolation Patient Management []  - 0 Hearing / Language / Visual special needs []  - 0 Assessment of Community assistance (transportation, D/C planning, etc.) []  - 0 Additional assistance / Altered mentation []  - 0 Support Surface(s) Assessment (bed, cushion, seat, etc.) INTERVENTIONS - Wound Cleansing / Measurement [name] ([id number]) X- 1 5 Simple Wound Cleansing - one wound []  - 0 Complex Wound Cleansing - multiple wounds X- 1 5 Wound Imaging (photographs -  any number of wounds) []  - 0 Wound Tracing (instead of photographs) X- 1 5 Simple Wound Measurement - one wound []  - 0 Complex Wound Measurement - multiple wounds INTERVENTIONS -  Wound Dressings []  - Small Wound Dressing one or multiple wounds 0 X- 1 15 Medium Wound Dressing one or multiple wounds []  - 0 Large Wound Dressing one or multiple wounds []  - 0 Application of Medications - topical []  - 0 Application of Medications - injection INTERVENTIONS - Miscellaneous []  - External ear exam 0 []  - 0 Specimen Collection (cultures, biopsies, blood, body fluids, etc.) []  - 0 Specimen(s) / Culture(s) sent or taken to Lab for analysis []  - 0 Patient Transfer (multiple staff / [name] / Similar devices) []  - 0 Simple Staple / Suture removal (25 or less) []  - 0 Complex Staple / Suture removal (26 or more) []  - 0 Hypo / Hyperglycemic Management (close monitor of Blood Glucose) []  - 0 Ankle / Brachial Index (ABI) - do not check if billed separately X- 1 5 Vital Signs Has the patient been seen at the hospital within the last three years: Yes Total Score: 80 Level Of Care: New/Established - Level 3 Electronic Signature(s) Signed: 10/17/2020 4:43:03 PM By: Georges Mouse, Minus Breeding RN Entered By: Georges Mouse, Minus Breeding on 10/17/2020 08:57:05 [name] ([id number]) -------------------------------------------------------------------------------- Encounter Discharge Information Details Patient Name: [name] Date of Service: 10/17/2020 8:15 AM Medical Record Number: [id number] Patient Account Number: [id number] Date of Birth/Sex: Jun 12, 1980 (40 y.o. M) Treating RN: Dolan Amen Primary Care Jasiel Apachito: [name] Other Clinician: Referring [name] Keough: [name] Treating Latanja Lehenbauer/Extender: [name] Cliche in Treatment: 201 Encounter Discharge Information Items Discharge Condition: Stable Ambulatory Status: Ambulatory Discharge Destination: Home Transportation:  Private Auto Accompanied By: self Schedule Follow-up Appointment: Yes Clinical Summary of Care: Electronic Signature(s) Signed: 10/18/2020 4:53:17 PM By: Jeanine Luz Entered By: Jeanine Luz on 10/17/2020 09:21:13 [name] ([id number]) -------------------------------------------------------------------------------- Lower Extremity Assessment Details Patient Name: [name] Date of Service: 10/17/2020 8:15 AM Medical Record Number: [id number] Patient Account Number: [id number] Date of Birth/Sex: 03-06-1981 (39 y.o. M) Treating RN: Donnamarie Poag Primary Care Tysheka Fanguy: [name] Other Clinician: Referring Asjah Rauda: [name] Treating Eimi Viney/Extender: [name] Cliche in Treatment: 201 Edema Assessment Assessed: [Left: Yes] [Right: No] [Left: Edema] [Right: :] Calf Left: Right: Point of Measurement: 33 cm From Medial Instep 50 cm Ankle Left: Right: Point of Measurement: 12 cm From Medial Instep 30 cm Vascular Assessment Pulses: Dorsalis Pedis Palpable: [Left:Yes] Electronic Signature(s) Signed: 10/17/2020 1:46:03 PM By: Donnamarie Poag Entered ByDonnamarie Poag on 10/17/2020 08:44:24 [place], Herbie [name] ([id number]) -------------------------------------------------------------------------------- Multi Wound Chart Details Patient Name: [name] Date of Service: 10/17/2020 8:15 AM Medical Record Number: [id number] Patient Account Number: [id number] Date of Birth/Sex: 1980/11/16 (39 y.o. M) Treating RN: Dolan Amen Primary Care Tilda Samudio: [name] Other Clinician: Referring Jennefer Kopp: [name] Treating Avin Upperman/Extender: [name] Cliche in Treatment: 201 Vital Signs Height(in): 71 Pulse(bpm): 49 Weight(lbs): 338 Blood Pressure(mmHg): 152/81 Body Mass Index(BMI): 47 Temperature(F): 98.5 Respiratory Rate(breaths/min): 16 Photos: [N/A:N/A] Wound Location: Left, Lateral Lower Leg N/A N/A Wounding Event: Gradually Appeared N/A N/A Primary  Etiology: Pyoderma N/A N/A Comorbid History: Sleep Apnea, Hypertension, Colitis N/A N/A Date Acquired: 11/18/2015 N/A N/A Weeks of Treatment: 201 N/A N/A Wound Status: Open N/A N/A Measurements L x W x D (cm) 6.2x7x0.9 N/A N/A Area (cm) : 34.086 N/A N/A Volume (cm) : 30.678 N/A N/A % Reduction in Area: -594.40% N/A N/A % Reduction in Volume: -681.20% N/A N/A Classification: Full Thickness With Exposed N/A N/A Support Structures Exudate Amount: Medium N/A N/A Exudate Type: Serosanguineous N/A N/A Exudate Color: red, brown N/A N/A Wound Margin: Epibole N/A N/A Granulation Amount: Medium (34-66%) N/A  N/A Granulation Quality: Red, Pink N/A N/A Necrotic Amount: Medium (34-66%) N/A N/A Exposed Structures: Fat Layer (Subcutaneous Tissue): N/A N/A Yes Muscle: Yes Fascia: No Tendon: No Joint: No Bone: No Epithelialization: Small (1-33%) N/A N/A Treatment Notes Electronic Signature(s) Signed: 10/17/2020 4:43:03 PM By: Georges Mouse, Minus Breeding RN Entered By: Georges Mouse, Minus Breeding on 10/17/2020 08:53:51 [name] ([id number]) -------------------------------------------------------------------------------- Multi-Disciplinary Care Plan Details Patient Name: [name] Date of Service: 10/17/2020 8:15 AM Medical Record Number: [id number] Patient Account Number: [id number] Date of Birth/Sex: 06-22-1980 (39 y.o. M) Treating RN: Dolan Amen Primary Care Saanvika Vazques: [name] Other Clinician: Referring Athen Riel: [name] Treating Isahia Hollerbach/Extender: [name] Cliche in Treatment: [place] reviewed with physician Active Inactive Electronic Signature(s) Signed: 10/17/2020 4:43:03 PM By: Georges Mouse, Minus Breeding RN Entered By: Georges Mouse, Minus Breeding on 10/17/2020 08:53:44 [name] ([id number]) -------------------------------------------------------------------------------- Pain Assessment Details Patient Name: [name] Date of Service:  10/17/2020 8:15 AM Medical Record Number: [id number] Patient Account Number: [id number] Date of Birth/Sex: 20-Feb-1981 (39 y.o. M) Treating RN: Donnamarie Poag Primary Care Taqwa Deem: [name] Other Clinician: Referring Jaeceon Michelin: [name] Treating Suhaila Troiano/Extender: [name] Cliche in Treatment: 201 Active Problems Location of Pain Severity and Description of Pain Patient Has Paino No Site Locations Rate the pain. Current Pain Level: 0 Pain Management and Medication Current Pain Management: Electronic Signature(s) Signed: 10/17/2020 1:46:03 PM By: Donnamarie Poag Entered By: Donnamarie Poag on 10/17/2020 08:38:13 [place]anks, Herbie [name] ([id number]) -------------------------------------------------------------------------------- Patient/Caregiver Education Details Patient Name: [name] Date of Service: 10/17/2020 8:15 AM Medical Record Number: [id number] Patient Account Number: [id number] Date of Birth/Gender: [birth year] (39 y.o. M) Treating RN: Dolan Amen Primary Care Physician: [name] Other Clinician: Referring Physician: [name] Treating Physician/Extender: [name] Cliche in Treatment: 201 Education Assessment Education Provided To: Patient Education Topics Provided Wound/Skin Impairment: Methods: Explain/Verbal Responses: State content correctly Electronic Signature(s) Signed: 10/17/2020 4:43:03 PM By: Georges Mouse, Minus Breeding RN Entered By: Georges Mouse, Minus Breeding on 10/17/2020 08:57:19 CRIAG, WICKLUND ([id number]) -------------------------------------------------------------------------------- Wound Assessment Details Patient Name: [name] Date of Service: 10/17/2020 8:15 AM Medical Record Number: [id number] Patient Account Number: [id number] Date of Birth/Sex: 12/03/1980 (39 y.o. M) Treating RN: Donnamarie Poag Primary Care Doris Mcgilvery: [name] Other Clinician: Referring Teodora Baumgarten: [name] Treating Liza Czerwinski/Extender: [name] Cliche in  Treatment: 201 Wound Status Wound Number: 1 Primary Etiology: Pyoderma Wound Location: Left, Lateral Lower Leg Wound Status: Open Wounding Event: Gradually Appeared Comorbid History: Sleep Apnea, Hypertension, Colitis Date Acquired: 11/18/2015 Weeks Of Treatment: 201 Clustered Wound: No Photos Wound Measurements Length: (cm) 6.2 Width: (cm) 7 Depth: (cm) 0.9 Area: (cm) 34.086 Volume: (cm) 30.678 % Reduction in Area: -594.4% % Reduction in Volume: -681.2% Epithelialization: Small (1-33%) Tunneling: No Undermining: No Wound Description Classification: Full Thickness With Exposed Support Structures Wound Margin: Epibole Exudate Amount: Medium Exudate Type: Serosanguineous Exudate Color: red, brown Foul Odor After Cleansing: No Slough/Fibrino Yes Wound Bed Granulation Amount: Medium (34-66%) Exposed Structure Granulation Quality: Red, Pink Fascia Exposed: No Necrotic Amount: Medium (34-66%) Fat Layer (Subcutaneous Tissue) Exposed: Yes Necrotic Quality: Adherent Slough Tendon Exposed: No Muscle Exposed: Yes Necrosis of Muscle: Yes Joint Exposed: No Bone Exposed: No Treatment Notes Wound #1 (Lower Leg) Wound Laterality: Left, Lateral Cleanser Normal Saline Discharge Instruction: Wash your hands with soap and water. Remove old dressing, discard into plastic bag and place into trash. Cleanse the wound with Normal Saline prior to applying a clean dressing using gauze sponges, not tissues or cotton balls. Do not Ferrell, Korde ([id number]) scrub or use excessive force.  Pat dry using gauze sponges, not tissue or cotton balls. Peri-Wound Care Topical Triamcinolone Acetonide Cream, 0.1%, 15 (g) tube Discharge Instruction: Apply as directed by Keyshun Elpers. Primary Dressing Secondary Dressing Mepilex Border Flex, 6x6 (in/in) Discharge Instruction: Apply to wound as directed. Do not cut. Secured With Tubigrip Size F, 4x10 (in/yd) Discharge Instruction: Apply double layer  Tubigrip F 3 finger-widths below knee to base of toes to secure dressing and/or for swelling. Compression Wrap Compression Stockings Add-Ons Electronic Signature(s) Signed: 10/17/2020 1:46:03 PM By: Donnamarie Poag Entered By: Donnamarie Poag on 10/17/2020 08:42:39 Turk, Herbie [name] ([id number]) -------------------------------------------------------------------------------- Vitals Details Patient Name: [name] Date of Service: 10/17/2020 8:15 AM Medical Record Number: [id number] Patient Account Number: [id number] Date of Birth/Sex: 1980-09-11 (39 y.o. M) Treating RN: Donnamarie Poag Primary Care Corbin Falck: [name] Other Clinician: Referring Jaimen Melone: [name] Treating Gissela Bloch/Extender: [name] Cliche in Treatment: 201 Vital Signs Time Taken: 08:36 Temperature (F): 98.5 Height (in): 71 Pulse (bpm): 79 Weight (lbs): 338 Respiratory Rate (breaths/min): 16 Body Mass Index (BMI): 47.1 Blood Pressure (mmHg): 152/81 Reference Range: 80 - 120 mg / dl Electronic Signature(s) Signed: 10/17/2020 1:46:03 PM By: Donnamarie Poag Entered ByDonnamarie Poag on 10/17/2020 08:38:05

## 2020-10-30 ENCOUNTER — Encounter: Payer: 59 | Attending: Physician Assistant | Admitting: Physician Assistant

## 2020-10-30 ENCOUNTER — Other Ambulatory Visit: Payer: Self-pay

## 2020-10-30 DIAGNOSIS — L03116 Cellulitis of left lower limb: Secondary | ICD-10-CM | POA: Diagnosis not present

## 2020-10-30 DIAGNOSIS — L97321 Non-pressure chronic ulcer of left ankle limited to breakdown of skin: Secondary | ICD-10-CM | POA: Insufficient documentation

## 2020-10-30 DIAGNOSIS — I872 Venous insufficiency (chronic) (peripheral): Secondary | ICD-10-CM | POA: Diagnosis not present

## 2020-10-30 DIAGNOSIS — G4730 Sleep apnea, unspecified: Secondary | ICD-10-CM | POA: Diagnosis not present

## 2020-10-30 DIAGNOSIS — I10 Essential (primary) hypertension: Secondary | ICD-10-CM | POA: Insufficient documentation

## 2020-10-30 DIAGNOSIS — L88 Pyoderma gangrenosum: Secondary | ICD-10-CM | POA: Insufficient documentation

## 2020-10-30 DIAGNOSIS — L97222 Non-pressure chronic ulcer of left calf with fat layer exposed: Secondary | ICD-10-CM | POA: Diagnosis not present

## 2020-10-30 NOTE — Progress Notes (Signed)
[name], OMALLEY ([id number]) Visit Report for 10/30/2020 Chief Complaint Document Details Patient Name: [name], [name] Date of Service: 10/30/2020 8:30 AM Medical Record Number: [id number] Patient Account Number: [id number] Date of Birth/Sex: [birth year] (40 y.o. 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 Male) Treating RN: Donnamarie Poag Primary Care Provider: [name] Other Clinician: Referring Provider: [name] Treating Provider/Extender: [name] Cliche in Treatment: 203 History of Present Illness HPI Description: 12/04/16; 40 year old man who comes into the clinic today for review of a wound on the posterior left calf. He tells me that is been there for about a year. He is not a diabetic he does smoke half a pack per day. He was seen in the ER on 11/20/16 felt to have cellulitis around the wound and was given clindamycin. An x-ray did not show osteomyelitis. The patient initially tells me that he has a milk allergy that sets off a pruritic itching rash on his lower legs which she scratches incessantly and he thinks that's what may have set up the wound. He has been using various topical antibiotics and ointments without any effect. He works in a [employer] and is on his feet all day. He does not have a  prior history of wounds however he does have the rash on both lower legs the right arm and the ventral aspect of his left arm. These are excoriations and clearly have had scratching however there are of macular looking areas on both legs including a substantial larger area on the right leg. This does not have an underlying open area. There is no blistering. The patient tells me that 2 years ago in [place] in response to the rash on his legs he saw a dermatologist who told him he had a condition which may be pyoderma gangrenosum although I may be putting words into his mouth. He seemed to recognize this. 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 He is not in any treatment for this. He's had no recent travel 12/11/16; the patient arrives today with his wound and roughly the same condition we've been using silver alginate this is a deep punched out wound with some surrounding erythema but no tenderness. Biopsy I did did not show confirmed pyoderma gangrenosum suggested nonspecific inflammation and vasculitis but does not provide an actual description of what was seen by the pathologist. I'm really not able to understand this We have also received information from the patient's dermatologist in [place] notes from April 2016. This was a doctor Agarwal-antal. 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 He was prescribed topical steroid high potency under occlusion which helped but at this point the patient did not have a deep punched out wound. 12/18/16; the patient's wound is larger in terms of surface area however this surface looks better and there is less depth. The surrounding erythema also is better. The patient states that the wrap we put on came off 2 days ago when he has been using his compression stockings.  He we are in the process of getting a dermatology consult. 12/26/16 on evaluation today patient's left lower extremity wound shows evidence of  infection with surrounding erythema noted. He has been tolerating the dressing changes but states that he has noted more discomfort. There is a larger area of erythema surrounding the wound. No fevers, chills, nausea, or vomiting noted at this time. With that being said the wound still does have slough covering the surface. He is not allergic to any medication that he is aware of at this point. In regard to his right lower extremity he had several regions that are erythematous and pruritic he wonders if there's anything we can do to help that. 01/02/17 I reviewed patient's wound culture which was obtained his visit last week. He was placed on doxycycline at that point. Unfortunately that does not appear to be an antibiotic that would likely help with the situation however the pseudomonas noted on culture is sensitive to Cipro. Also unfortunately patient's wound seems to have a large compared to last week's evaluation. Not severely so but there are definitely increased measurements in general. He is continuing to have discomfort as well he writes this to be a seven out of 10. In fact he would prefer me not to perform any debridement today due to the fact that he is having discomfort and considering he has an active infection on the little reluctant to do so anyway. No fevers, chills, nausea, or vomiting noted at this time. 01/08/17; patient seems dermatology on September 5. I suspect dermatology will want the slides from the biopsy I did sent to their pathologist. I'm not sure if there is a way we can expedite that. In any case the culture I did before I left on vacation 3 weeks ago showed Pseudomonas he was given 10 days of Cipro and per her description of her intake nurses is actually somewhat better this week although the wound is quite a bit bigger than I remember the last time I saw this. He still has 3 more days of Cipro 01/21/17; dermatology appointment tomorrow. He has completed the ciprofloxacin  for Pseudomonas. Surface of the wound looks better however he is had some deterioration in the lesions on his right leg. Meantime the left lateral leg wound we will continue with sample 01/29/17; patient had his dermatology appointment but I can't yet see that note. He is completed his antibiotics. The wound is more superficial but considerably larger in circumferential area than when he came in. This is in his left lateral calf. He also has swollen erythematous areas with superficial wounds on the right leg and small papular areas on both arms. There apparently areas in her his upper thighs and buttocks I did not look at those. Dermatology biopsied the right leg. Hopefully will have their input next week. 02/05/17; patient went back to see his dermatologist who told him that he had a "scratching problem" as well as staph. He is now on a 30 day course of doxycycline and I believe she gave him triamcinolone cream to the right leg areas to help with the itching [not exactly sure but probably triamcinolone]. She apparently looked at the left lateral leg wound although this was not rebiopsied and I think felt to be ultimately part of the same pathogenesis. He is using sample border foam and changing nevus himself. He now has a new open area on the right posterior leg which was his biopsy site I don't have any of the dermatology notes  02/12/17; we put the patient in compression last week with SANTYL to the wound on the left leg and the biopsy. Edema is much better and the depth of the wound is now at level of skin. Area is still the same oBiopsy site on the right lateral leg we've also been using santyl with a border foam dressing and he is changing this himself. 02/19/17; Using silver alginate started last week to both the substantial left leg wound and the biopsy site on the right wound. He is tolerating compression well. Has a an appointment with his primary M.D. tomorrow wondering about diuretics although  I'm wondering if the edema problem is actually lymphedema 02/26/17; the patient has been to see his primary doctor Dr. Jerrel Ivory at [place] our primary care. She started him on Lasix 20 mg and this seems to have helped with the edema. However we are not making substantial change with the left lateral calf wound and inflammation. The biopsy site on the right leg also looks stable but not really all that different. 03/12/17; the patient has been to see vein and vascular Dr. Lucky Cowboy. He has had venous reflux studies I have not reviewed these. I did get a call from his dermatology office. They felt that he might have pathergy based on their biopsy on his right leg which led them to look at the slides of GREYSYN, VANDERBERG ([id number]) the biopsy I did on the left leg and they wonder whether this represents pyoderma gangrenosum which was the original supposition in a man with ulcerative colitis albeit inactive for many years. They therefore recommended clobetasol and tetracycline i.e. 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 03/26/17; apparently the patient just had reflux studies not an appointment with Dr. dew. She arrives in clinic today having applied clobetasol for 2-3 weeks. He notes over the last 2-3 days excessive drainage having to change the dressing 3-4 times a day and also expanding erythema. 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 Lillia Carmel at [facility] dermatology locally who kindly saw him at my request. 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 My concern was the degree of erythema medially extending from his left leg wound which was either inflammation from pyoderma  or cellulitis. I put him on Augmentin however culture of the wound showed Pseudomonas which is quinolone sensitive. I really don't believe he has cellulitis however in view of everything I will continue and give him a course of Cipro. He is also on doxycycline as an immune modulator for the pyoderma. In addition to his original wound on the left lateral leg with surrounding erythema he has a wound on the right posterior calf which was an original biopsy site done by dermatology. 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 Saw Dr. Lillia Carmel yesterday. He has been using topical antibiotics to both wound areas his original wound on the left and the biopsies/pathergy area on the right. There is definitely some improvement in the inflammation around the wound on the right although the patient states he has increasing sensitivity of the wounds. He is on prednisone 60 and doxycycline 1 as prescribed by Dr. Lillia Carmel. He is covering the topical antibiotic with gauze  and putting this in his own compression stocks and changing this daily. He states that Dr. Lottie Rater did a culture of the left leg wound yesterday 05/07/17; pyoderma gangrenosum. The patient saw Dr. Lillia Carmel yesterday and has a follow-up with her in one month. He is still using topical antibiotics to both wounds although he can't recall exactly what type. He is still on prednisone 60 mg. Dr. Lillia Carmel stated that the doxycycline could stop if we were in agreement. 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 The right medial leg was induced by biopsy/pathergy. The area on the right is essentially healed. 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 The right medial leg has closed and remains closed. He  is still on prednisone 60. oHe tells me he missed his last dermatology appointment with Dr. Lillia Carmel but will make another appointment. He reports that her blood sugar at a recent screen in [place] was high 200's. He was 180 today. 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 I think he is going to require still much longer prednisone perhaps another 3 months before attempting to taper. In the meantime his wound is a lot better. Smaller. He is cleaning this off daily and applying topical antibiotics. When he was last in the clinic I thought about changing to [facility] and actually put in a couple of calls to dermatology although probably not during their business hours. 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 He continues on oral prednisone. He has been using triple antibiotic ointment. There is surface debris and we will transition to [place] and have him return in 2 weeks. He has lost 30 pounds since his last appointment with lifestyle modification. He may benefit from topical steroid cream for treatment this can be considered at a later date. 08/22/17 on evaluation today patient appears to actually be doing rather well in regard to his left lateral lower extremity ulcer. He has actually been managed by Dr. Dellia Nims most recently. Patient is currently on oral steroids at this time. This seems to have been of benefit for him. Nonetheless his last visit was actually with Leah on 08/06/17. Currently he is not utilizing any topical steroid creams although this could be of benefit as well. No fevers, chills, nausea, or vomiting noted at this time. 09/05/17 on evaluation today patient appears to be doing better in regard to his left lateral lower extremity ulcer. He has been tolerating the dressing changes without complication. He is using Santyl with good effect. Overall I'm  very pleased with how things are standing at this point. Patient likewise is happy that this is doing better. 09/19/17 on evaluation today patient actually appears to be doing rather well in regard to his left lateral lower extremity ulcer. 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 He's not having any significant pain. 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 He has been tolerating the Santyl without complication and in general I feel like he's making good progress. 10/17/17 on evaluation today patient  appears to be doing very well in regard to his left lateral lower surety ulcer. He has been tolerating the dressing changes without complication. There does not appear to be any evidence of infection he's alternating the Santyl and the triple antibiotic ointment every other day this seems to be doing well for him. 11/03/17 on evaluation today patient appears to be doing very well in regard to his left lateral lower extremity ulcer. He is been tolerating the dressing changes without complication which is good news. Fortunately there does not appear to be any evidence of infection which is also great news. Overall is doing excellent they are starting to taper down on the prednisone is down to 40 mg at this point it also started topical clobetasol for him. 11/17/17 on evaluation today patient appears to be doing well in regard to his left lateral lower surety ulcer. He's been tolerating the dressing changes without complication. He does note that he is having no pain, no excessive drainage or discharge, and overall he feels like things are going about how he would expect and hope they would. Overall he seems to have no evidence of infection at this time in my opinion which is good news. 12/04/17-He is seen in follow-up  evaluation for right lateral lower extremity ulcer. He has been applying topical steroid cream. Today's measurement show slight increase in size. Over the next 2 weeks we will transition to every other day Santyl and steroid cream. He has been encouraged to monitor for changes and notify clinic with any concerns 12/15/17 on evaluation today patient's left lateral motion the ulcer and fortunately is doing worse again at this point. This just since last week to this week has close to doubled in size according to the patient. I did not seeing last week's I do not have a visual to compare this to in our system was also down so we do not have all the charts and at this point. Nonetheless it does have me somewhat concerned in regard to the fact that again he was worried enough about it he has contact the dermatology that placed them back on the full strength, 50 mg a day of the prednisone that he was taken previous. He continues to alternate using clobetasol along with Santyl at this point. He is obviously somewhat frustrated. 12/22/17 on evaluation today patient appears to be doing a little worse compared to last evaluation. Unfortunately the wound is a little deeper and slightly larger than the last week's evaluation. With that being said he has made some progress in regard to the irritation surrounding at this time unfortunately despite that progress that's been made he still has a significant issue going on here. 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 HUEL, CENTOLA ([id number]) For that reason I discussed with him today the possibility of perform a wound culture to ensure there's nothing overtly infected. 01/06/18 on evaluation today patient's wound is larger and deeper than previously evaluated. With that being said it did appear that his wound was  infected after my last evaluation with him. Subsequently I did end up prescribing a prescription for Bactrim DS which she has been taking and having no complication with. Fortunately there does not appear to be any evidence of infection at this point in time as far as anything spreading, no want to touch, and overall I feel like  things are showing signs of improvement. 01/13/18 on evaluation today patient appears to be even a little larger and deeper than last time. There still muscle exposed in the base of the wound. Nonetheless he does appear to be less erythematous I do believe inflammation is calming down also believe the infection looks like it's probably resolved at this time based on what I'm seeing. No fevers, chills, nausea, or vomiting noted at this time. 01/30/18 on evaluation today patient actually appears to visually look better for the most part. Unfortunately those visually this looks better he does seem to potentially have what may be an abscess in the muscle that has been noted in the central portion of the wound. This is the first time that I have noted what appears to be fluctuance in the central portion of the muscle. With that being said I'm somewhat more concerned about the fact that this might indicate an abscess formation at this location. I do believe that an ultrasound would be appropriate. This is likely something we need to try to do as soon as possible. He has been switch to mupirocin ointment and he is no longer using the steroid ointment as prescribed by dermatology he sees them again next week he's been decreased from 60 to 40 mg of prednisone. 03/09/18 on evaluation today patient actually appears to be doing a little better compared to last time I saw him. There's not as much erythema surrounding the wound itself. He I did review his most recent infectious disease note which was dated 02/24/18. He saw Dr. Michel Bickers in [place]. 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 Patient is now off of antibiotics and they are continually observing this. There seems to be no change in the past two weeks in my pinion based on what the patient says and what I see today compared to what Dr. Megan Salon likely saw two weeks ago. No fevers, chills, nausea, or vomiting noted at this time. 03/23/18 on evaluation today patient's wound actually appears to be showing signs of improvement which is good news. He is currently still on the Dapsone. He is also working on tapering the prednisone to get off of this and Dr. Lottie Rater is working with him in this regard. Nonetheless overall I feel like the wound is doing well it does appear based on the infectious disease note that I reviewed from Dr. 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 I did also review the results of his ultrasound of left lower extremity which revealed there was a dentist tissue in the base of the wound without an abscess noted. 04/06/18 on evaluation today the patient's left lateral lower extremity ulcer actually appears to be doing fairly well which is excellent news. There does not appear to be any evidence of infection at this time which is also great news. Overall he still does have a significantly large ulceration although little by little he seems to be making progress. He is down to 10 mg a day of the prednisone. 04/20/18 on evaluation today patient actually appears to be doing excellent at this time in regard to his left lower extremity ulcer. He's making signs of good progress unfortunately this is taking much longer than we would really like to see but nonetheless he is  making progress. Fortunately there does not appear to be any evidence of infection at this time. No fevers, chills, nausea, or vomiting noted at  this time. The patient has not been using the Santyl due to the cost he hadn't got in this field yet. He's mainly been using the antibiotic ointment topically. Subsequently he also tells me that he really has not been scrubbing in the shower I think this would be helpful again as I told him it doesn't have to be anything too aggressive to even make it believe just enough to keep it free of some of the loose slough and biofilm on the wound surface. 05/11/18 on evaluation today patient's wound appears to be making slow but sure progress in regard to the left lateral lower extremity ulcer. He is been tolerating the dressing changes without complication. Fortunately there does not appear to be any evidence of infection at this time. He is still just using triple antibiotic ointment along with clobetasol occasionally over the area. He never got the Santyl and really does not seem to intend to in my pinion. 06/01/18 on evaluation today patient appears to be doing a little better in regard to his left lateral lower extremity ulcer. He states that overall he does not feel like he is doing as well with the Dapsone as he did with the prednisone. Nonetheless he sees his dermatologist later today and is gonna talk to them about the possibility of going back on the prednisone. Overall again I believe that the wound would be better if you would utilize Santyl but he really does not seem to be interested in going back to the [place] at this point. He has been using triple antibiotic ointment. 06/15/18 on evaluation today patient's wound actually appears to be doing about the same at this point. Fortunately there is no signs of infection at this time. He has made slight improvements although he continues to not really want to clean the wound bed at this point. He states that he just doesn't mess with it he doesn't want to cause any problems with everything else he has going on. He has been on medication, antibiotics  as prescribed by his dermatologist, for a staff infection of his lower extremities which is really drying out now and looking much better he tells me. Fortunately there is no sign of overall infection. 06/29/18 on evaluation today patient appears to be doing well in regard to his left lateral lower surety ulcer all things considering. Fortunately his staff infection seems to be greatly improved compared to previous. He has no signs of infection and this is drying up quite nicely. He is still the doxycycline for this is no longer on cental, Dapsone, or any of the other medications. His dermatologist has recommended possibility of an infusion but right now he does not want to proceed with that. 07/13/18 on evaluation today patient appears to be doing about the same in regard to his left lateral lower surety ulcer. Fortunately there's no signs of infection at this time which is great news. Unfortunately he still builds up a significant amount of Slough/biofilm of the surface of the wound he still is not really cleaning this as he should be appropriately. Again I'm able to easily with saline and gauze remove the majority of this on the surface which if you would do this at home would likely be a dramatic improvement for him as far as getting the area to improve. Nonetheless overall I still feel like he  is making progress is just very slow. I think Santyl will be of benefit for him as well. Still he has not gotten this as of this point. 07/27/18 on evaluation today patient actually appears to be doing little worse in regards of the erythema around the periwound region of the wound he also tells me that he's been having more drainage currently compared to what he was experiencing last time I saw him. He states not quite as bad as what he had because this was infected previously but nonetheless is still appears to be doing poorly. Fortunately there is no evidence of systemic infection at this point. The patient  tells me that he is not going to be able to afford the Santyl. He is still waiting to hear about the infusion therapy with his dermatologist. Apparently she wants an updated colonoscopy first. 08/10/18 on evaluation today patient appears to be doing better in regard to his left lateral lower extremity ulcer. Fortunately he is showing signs of improvement in this regard he's actually been approved for Remicade infusion's as well although this has not been scheduled as of yet. Fortunately there's no signs of active infection at this time in regard to the wound although he is having some issues with infection of the right lower extremity is been seen as dermatologist for this. Fortunately they are definitely still working with him trying to keep things under control. PHI, AVANS ([id number]) 09/07/18 on evaluation today patient is actually doing rather well in regard to his left lateral lower extremity ulcer. He notes these actually having some hair grow back on his extremity which is something he has not seen in years. He also tells me that the pain is really not giving them any trouble at this time which is also good news overall she is very pleased with the progress he's using a combination of the mupirocin along with the probate is all mixed. 09/21/18 on evaluation today patient actually appears to be doing fairly well all things considered in regard to his looks from the ulcer. He's been tolerating the dressing changes without complication. Fortunately there's no signs of active infection at this time which is good news he is still on all antibiotics or prevention of the staff infection. He has been on prednisone for time although he states it is gonna contact his dermatologist and see if she put them on a short course due to some irritation that he has going on currently. Fortunately there's no evidence of any overall worsening this is going very slow I think cental would be something that would be  helpful for him although he states that $50 for tube is quite expensive. He therefore is not willing to get that at this point. 10/06/18 on evaluation today patient actually appears to be doing decently well in regard to his left lateral leg ulcer. He's been tolerating the dressing changes without complication. Fortunately there's no signs of active infection at this time. Overall I'm actually rather pleased with the progress he's making although it's slow he doesn't show any signs of infection and he does seem to be making some improvement. I do believe that he may need a switch up and dressings to try to help this to heal more appropriately and quickly. 10/19/18 on evaluation today patient actually appears to be doing better in regard to his left lateral lower extremity ulcer. This is shown signs of having much less Slough buildup at this point due to the fact he has been using  the Santyl. Obviously this is very good news. The overall size of the wound is not dramatically smaller but again the appearance is. 11/02/18 on evaluation today patient actually appears to be doing quite well in regard to his lower [place] ulcer. A lot of the skin around the ulcer is actually somewhat irritating at this point this seems to be more due to the dressing causing irritation from the adhesive that anything else. Fortunately there is no signs of active infection at this time. 11/24/18 on evaluation today patient appears to be doing a little worse in regard to his overall appearance of his lower extremity ulcer. There's more erythema and warmth around the wound unfortunately. He is currently on doxycycline which he has been on for some time. With that being said I'm not sure that seems to be helping with what appears to possibly be an acute cellulitis with regard to his left lower extremity ulcer. No fevers, chills, nausea, or vomiting noted at this time. 12/08/18 on evaluation today patient's wounds actually appears to  be doing significantly better compared to his last evaluation. He has been using Santyl along with alternating tripling about appointment as well as the steroid cream seems to be doing quite well and the wound is showing signs of improvement which is excellent news. Fortunately there's no evidence of infection and in fact his culture came back negative with only normal skin flora noted. 12/21/2018 upon evaluation today patient actually appears to be doing excellent with regard to his ulcer. This is actually the best that I have seen it since have been helping to take care of him. It is both smaller as well as less slough noted on the surface of the wound and seems to be showing signs of good improvement with new skin growing from the edges. He has been using just the triamcinolone he does wonder if he can get a refill of that ointment today. 01/04/2019 upon evaluation today patient actually appears to be doing well with regard to his left lateral lower extremity ulcer. With that being said it does not appear to be that he is doing quite as well as last time as far as progression is concerned. There does not appear to be any signs of infection or significant irritation which is good news. With that being said I do believe that he may benefit from switching to a collagen based dressing based on how clean The wound appears. 01/18/2019 on evaluation today patient actually appears to be doing well with regard to his wound on the left lower extremity. He is not made a lot of progress compared to where we were previous but nonetheless does seem to be doing okay at this time which is good news. There is no signs of active infection which is also good news. My only concern currently is I do wish we can get him into utilizing the collagen dressing his insurance would not pay for the supplies that we ordered although it appears that he may be able to order this through his supply company that he typically utilizes.  This is [employer]. Nonetheless he did try to order it during the office visit today and it appears this did go through. We will see if he can get that it is a different brand but nonetheless he has collagen and I do think will be beneficial. 02/01/2019 on evaluation today patient actually appears to be doing a little worse today in regard to the overall size of his wounds. Fortunately there  is no signs of active infection at this time. That is visually. Nonetheless when this is happened before it was due to infection. For that reason were somewhat concerned about that this time as well. 02/08/2019 on evaluation today patient unfortunately appears to be doing slightly worse with regard to his wound upon evaluation today. Is measuring a little deeper and a little larger unfortunately. I am not really sure exactly what is causing this to enlarge he actually did see his dermatologist she is going to see about initiating Humira for him. Subsequently she also did do steroid injections into the wound itself in the periphery. Nonetheless still nonetheless he seems to be getting a little bit larger he is gone back to just using the steroid cream topically which I think is appropriate. I would say hold off on the collagen for the time being is definitely a good thing to do. Based on the culture results which we finally did get the final result back regarding it shows staph as the bacteria noted again that can be a normal skin bacteria based on the fact however he is having increased drainage and worsening of the wound measurement wise I would go ahead and place him on an antibiotic today I do believe for this. 02/15/2019 on evaluation today patient actually appears to be doing somewhat better in regard to his ulcer. There is no signs of worsening at this time I did review his culture results which showed evidence of Staphylococcus aureus but not MRSA. Again this could just be more related to the normal skin  bacteria although he states the drainage has slowed down quite a bit he may have had a mild infection not just colonization. And was much smaller and then since around10/04/2019 on evaluation today patient appears to be doing unfortunately worse as far as the size of the wound. I really feel like that this is steadily getting larger again it had been doing excellent right at the beginning of September we have seen a steady increase in the area of the wound it is almost 2-1/2 times the size it was on September 1. Obviously this is a bad trend this is not wanting to see. For that reason we went back to using just the topical triamcinolone cream which does seem to help with inflammation. I checked him for bacteria by way of culture and nothing showed positive there. I am considering giving him a short course of a tapering steroid Dosepak today to see if that is can be beneficial for him. The patient is in agreement with giving that a try. 03/08/2019 on evaluation today patient appears to be doing very well in comparison to last evaluation with regard to his lower extremity ulcer. This is showing signs of less inflammation and actually measuring slightly smaller compared to last time every other week over the past month and a half he has been measuring larger larger larger. Nonetheless I do believe that the issue has been inflammation the prednisone does seem to [facility], [place] ([id number]) have been beneficial for him which is good news. No fevers, chills, nausea, vomiting, or diarrhea. 03/22/2019 on evaluation today patient appears to be doing about the same with regard to his leg ulcer. He has been tolerating the dressing changes without complication. With that being said the wound seems to be mostly arrested at its current size but really is not making any progress except for when we prescribed the prednisone. He did show some signs of dropping as far as  the overall size of the wound during that interval  week. Nonetheless this is something he is not on long-term at this point and unfortunately I think he is getting need either this or else the Humira which his dermatologist has discussed try to get approval for. With that being said he will be seeing his dermatologist on the 11th of this month that is November. 04/19/2019 on evaluation today patient appears to be doing really about the same the wound is measuring slightly larger compared to last time I saw him. He has not been into the office since November 2 due to the fact that he unfortunately had Covid as that his entire family. He tells me that it was rough but they did pull-through and he seems to be doing much better. Fortunately there is no signs of active infection at this time. No fevers, chills, nausea, vomiting, or diarrhea. 05/10/2019 on evaluation today patient unfortunately appears to be doing significantly worse as compared to last time I saw him. He does tell me that he has had his first dose of Humira and actually is scheduled to get the next one in the upcoming week. With that being said he tells me also that in the past several days he has been having a lot of issues with green drainage she showed me a picture this is more blue-green in color. He is also been having issues with increased sloughy buildup and the wound does appear to be larger today. Obviously this is not the direction that we want everything to take based on the starting of his Humira. Nonetheless I think this is definitely a result of likely infection and to be honest I think this is probably Pseudomonas causing the infection based on what I am seeing. 05/24/2019 on evaluation today patient unfortunately appears to be doing significantly worse compared to his prior evaluation with me 2 weeks ago. I did review his culture results which showed that he does have Staph aureus as well as Pseudomonas noted on the culture. Nonetheless the Levaquin that I prescribed for him  does not appear to have been appropriate and in fact he tells me he is no longer experiencing the green drainage and discharge that he had at the last visit. Fortunately there is no signs of active infection at this time which is good news although the wound has significantly worsened it in fact is much deeper than it was previous. We have been utilizing up to this point triamcinolone ointment as the prescription topical of choice but at this time I really feel like that the wound is getting need to be packed in order to appropriately manage this due to the deeper nature of the wound. Therefore something along the lines of an alginate dressing may be more appropriate. 05/31/2019 upon inspection today patient's wound actually showed signs of doing poorly at this point. Unfortunately he just does not seem to be making any good progress despite what we have tried. He actually did go ahead and pick up the Cipro and start taking that as he was noticing more green drainage he had previously completed the Levaquin that I prescribed for him as well. Nonetheless he missed his appointment for the seventh last week on Wednesday with the wound care center and [facility] where his dermatologist referred him. Obviously I do think a second opinion would be helpful at this point especially in light of the fact that the patient seems to be doing so poorly despite the fact  that we have tried everything that I really know how at this point. The only thing that ever seems to have helped him in the past is when he was on high doses of continual steroids that did seem to make a difference for him. 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 06/07/2019 upon evaluation today patient unfortunately appears to be doing worse yet again with regard to his wound. In fact I  am starting to question whether or not he may have a fluid pocket in the muscle at this point based on the bulging and the soft appearance to the central portion of the muscle area. There is not anything draining from the muscle itself at this time which is good news but nonetheless the wound is expanding. I am not really seeing any results of the Humira as far as overall wound progression based on what I am seeing at this point. The patient has been referred for second opinion with regard to his wound to the [facility] wound care center by his dermatologist which I definitely am not in opposition to. Unfortunately we tried multiple dressings in the past including collagen, alginate, and at one point even Hydrofera Blue. With that being said he is never really used it for any significant amount of time due to the fact that he often complains of pain associated with these dressings and then will go back to either using the Santyl which she has done intermittently or more frequently the triamcinolone. He is also using his own compression stockings. We have wrapped him in the past but again that was something else that he really was not a big fan of. 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 He seemed to do much better when he was on the steroids although honestly I understand that the steroids are not necessarily the best medication to be on long-term obviously 06/14/2019 on evaluation today patient appears to be doing actually a little bit better with regard to the overall appearance with his leg. Unfortunately he does continue to have issues with what appears to be some fluid underneath  the muscle although he did see the wound specialty center at [facility] last week their main goals were to see about infusion therapy in place of the Humira as they feel like that is not quite strong enough. They also recommended that we continue with the treatment otherwise as we are they felt like that was appropriate and they are okay with him continuing to follow-up here with [place] in that regard. With that being said they are also sending him to the vein specialist there to see about vein stripping and if that would be of benefit for him. Subsequently they also did not really address whether or not an ultrasound of the muscle area to see if there is anything that needs to be addressed here would be appropriate or not. For that reason I discussed this with him last week I think we  may proceed down that road at this point. 06/21/2019 upon evaluation today patient's wound actually appears to be doing slightly better compared to previous evaluations. I do believe that he has made a difference with regard to the progression here with the use of oral steroids. Again in the past has been the only thing that is really calm things down. He does tell me that from [facility] is gotten a good news from there that there are no further vein stripping that is necessary at this point. I do not have that available for review today although the patient did relay this to me. He also did obtain and have the ultrasound of the wound completed which I did sign off on today. It does appear that there is no fluid collection under the muscle this is likely then just edematous tissue in general. That is also good news. Overall I still believe the inflammation is the main issue here. He did inquire about the possibility of a wound VAC again with the muscle protruding like it is I am not really sure whether the wound VAC is necessarily ideal or not. That is something we will have to consider although I do believe he may need compression  wrapping to try to help with edema control which could potentially be of benefit. 06/28/2019 on evaluation today patient appears to be doing slightly better measurement wise although this is not terribly smaller he least seems to be trending towards that direction. With that being said he still seems to have purulent drainage noted in the wound bed at this time. He has been on Levaquin followed by Cipro over the past month. Unfortunately he still seems to have some issues with active infection at this time. I did perform a culture last week in order to evaluate and see if indeed there was still anything going on. Subsequently the culture did come back showing Pseudomonas which is consistent with the drainage has been having which is blue-green in color. He also has had an odor that again was somewhat consistent with Pseudomonas as well. Long story short it appears that the culture showed an intermediate finding with regard to how well the Cipro will work for the Pseudomonas infection. Subsequently being that he does not seem to be clearing up and at best what we are doing is just keeping this at [place] I think he may need to see infectious disease to discuss IV antibiotic options. EUGINE, BUBB ([id number]) 07/05/2019 upon evaluation today patient appears to be doing okay in regard to his leg ulcer. He has been tolerating the dressing changes at this point without complication. Fortunately there is no signs of active infection at this time which is good news. No fevers, chills, nausea, vomiting, or diarrhea. With that being said he does have an appointment with infectious disease tomorrow and his primary care on Wednesday. Again the reason for the infectious disease referral was due to the fact that he did not seem to be fully resolving with the use of oral antibiotics and therefore we were thinking that IV antibiotic therapy may be necessary secondary to the fact that there was an intermediate finding for  how effective the Cipro may be. Nonetheless again he has been having a lot of purulent and even green drainage. Fortunately right now that seems to have calmed down over the past week with the reinitiation of the oral antibiotic. Nonetheless we will see what Dr. Megan Salon has to say. 07/12/2019 upon evaluation today patient appears to be  doing about the same at this point in regard to his left lower extremity ulcer. Fortunately there is no signs of active infection at this time which is good news I do believe the Levaquin has been beneficial I did review Dr. Hale Bogus note and to be honest I agree that the patient's leg does appear to be doing better currently. What we found in the past as he does not seem to really completely resolve he will stop the antibiotic and then subsequently things will revert back to having issues with blue-green drainage, increased pain, and overall worsening in general. Obviously that is the reason I sent him back to infectious disease. 07/19/2019 upon evaluation today patient appears to be doing roughly the same in size there is really no dramatic improvement. He has started back on the Levaquin at this point and though he seems to be doing okay he did still have a lot of blue/green drainage noted on evaluation today unfortunately. I think that this is still indicative more likely of a Pseudomonas infection as previously noted and again he does see Dr. Megan Salon in just a couple of days. I do not know that were really able to effectively clear this with just oral antibiotics alone based on what I am seeing currently. Nonetheless we are still continue to try to manage as best we can with regard to the patient and his wound. I do think the wrap was helpful in decreasing the edema which is excellent news. No fevers, chills, nausea, vomiting, or diarrhea. 07/26/2019 upon evaluation today patient appears to be doing slightly better with regard to the overall appearance of the muscle  there is no dark discoloration centrally. Fortunately there is no signs of active infection at this time. No fevers, chills, nausea, vomiting, or diarrhea. Patient's wound bed currently the patient did have an appointment with Dr. Megan Salon at infectious disease last week. With that being said Dr. Megan Salon the patient states was still somewhat hesitant about put him on any IV antibiotics he wanted [place] to repeat cultures today and then see where things go going forward. He does look like Dr. Megan Salon because of some improvement the patient did have with the Levaquin wanted [place] to see about repeating cultures. If it indeed grows the Pseudomonas again then he recommended a possibility of considering a PICC line placement and IV antibiotic therapy. He plans to see the patient back in 1 to 2 weeks. 08/02/2019 upon evaluation today patient appears to be doing poorly with regard to his left lower extremity. We did get the results of his culture back it shows that he is still showing evidence of Pseudomonas which is consistent with the purulent/blue-green drainage that he has currently. Subsequently the culture also shows that he now is showing resistance to the oral fluoroquinolones which is unfortunate as that was really the only thing to treat the infection prior. I do believe that he is looking like this is going require IV antibiotic therapy to get this under control. Fortunately there is no signs of systemic infection at this time which is good news. The patient does see Dr. Megan Salon tomorrow. 08/09/2019 upon evaluation today patient appears to be doing better with regard to his left lower extremity ulcer in regard to the overall appearance. He is currently on IV antibiotic therapy. As ordered by Dr. Megan Salon. Currently the patient is on ceftazidime which she is going to take for the next 2 weeks and then follow-up for 4 to 5-week appointment with Dr. Megan Salon.  The patient started this this past Friday  symptoms have not for a total of 3 days currently in full. 08/16/2019 upon evaluation today patient's wound actually does show muscle in the base of the wound but in general does appear to be much better as far as the overall evidence of infection is concerned. In fact I feel like this is for the most part cleared up he still on the IV antibiotics he has not completed the full course yet but I think he is doing much better which is excellent news. 08/23/2019 upon evaluation today patient appears to be doing about the same with regard to his wound at this point. He tells me that he still has pain unfortunately. Fortunately there is no evidence of systemic infection at this time which is great news. There is significant muscle protrusion. 09/13/19 upon evaluation today patient appears to be doing about the same in regard to his leg unfortunately. He still has a lot of drainage coming from the ulceration there is still muscle exposed. With that being said the patient's last wound culture still showed an intermediate finding with regard to the Pseudomonas he still having the bluish/green drainage as well. Overall I do not know that the wound has completely cleared of infection at this point. Fortunately there is no signs of active infection systemically at this point which is good news. 09/20/2019 upon evaluation today patient's wound actually appears to be doing about the same based on what I am seeing currently. I do not see any signs of systemic infection he still does have evidence of some local infection and drainage. He did see Dr. Megan Salon last week and Dr. Megan Salon states that he probably does need a different IV antibiotic although he does not want to put him on this until the patient begins the Remicade infusion which is actually scheduled for about 10 days out from today on 13 May. Following that time Dr. Megan Salon is good to see him back and then will evaluate the feasibility of starting him on the  IV antibiotic therapy once again at that point. I do not disagree with this plan I do believe as Dr. Megan Salon stated in his note that I reviewed today that the patient's issue is multifactorial with the pyoderma being 1 aspect of this that were hoping the Remicade will be helpful for her. In the meantime I think the gentamicin is, helping to keep things under decent okay control in regard to the ulcer. 09/27/2019 upon evaluation today patient appears to be doing about the same with regard to his wound still there is a lot of muscle exposure though he does have some hyper granulation tissue noted around the edge and actually some granulation tissue starting to form over the muscle which is actually good news. Fortunately there is no evidence of active infection which is also good news. His pain is less at this point. 5/21; this is a patient I have not seen in a long time. 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 He has a large wound on the left lateral leg with protruding muscle. He comes in the clinic today showing the same area on his left medial ankle. He says there is been a spot there for some time although we have not previously defined this. Today he has a clearly defined area with slight amount of skin breakdown surrounded by raised areas with a purplish hue in color. This is not painful he says it is irritated.  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 He still has muscle protruding from the lateral portion of his left leg but fortunately the new area that they were concerned about at his last visit does not appear to have opened at this point. He is currently on Remicade infusions and seems to be doing better in my opinion in fact the wound itself seems to be overall much better. The purplish discoloration that he did have seems to have resolved  and I think that is a good sign that hopefully the Remicade is doing its job. He does have some biofilm noted over the surface of the wound. 11/01/2019 on evaluation today patient's wound actually appears to be doing excellent at this time. Fortunately there is no evidence of active infection and overall I feel like he is making great progress. The Remicade seems to be due excellent job in my opinion. WALID, HAIG ([id number]) 11/08/19 evaluation today vision actually appears to be doing quite well with regard to his weight ulcer. He's been tolerating dressing changes without complication. Fortunately there is no evidence of infection. No fevers, chills, nausea, or vomiting noted at this time. Overall states that is having more itching than pain which is actually a good sign in my opinion. 12/13/2019 upon evaluation today patient appears to be doing well today with regard to his wound. He has been tolerating the dressing changes without complication. Fortunately there is no sign of active infection at this time. No fevers, chills, nausea, vomiting, or diarrhea. Overall I feel like the infusion therapy has been very beneficial for him. 01/06/2020 on evaluation today patient appears to be doing well with regard to his wound. This is measuring smaller and actually looks to be doing better. Fortunately there is no signs of active infection at this point. No fevers, chills, nausea, vomiting, or diarrhea. With that being said he does still have the blue-green drainage but this does not seem to be causing any significant issues currently. He has been using the gentamicin that does seem to be keeping things under decent control at this point. He goes later this morning for his next infusion therapy for the pyoderma which seems to also be very beneficial. 02/07/2020 on evaluation today patient appears to be doing about the same in regard to his wounds currently. Fortunately there is no signs of active infection  systemically he does still have evidence of local infection still using gentamicin. He also is showing some signs of improvement albeit slowly I do feel like we are making some progress here. 02/21/2020 upon evaluation today patient appears to be making some signs of improvement the wound is measuring a little bit smaller which is great news and overall I am very pleased with where he stands currently. He is going to be having infusion therapy treatment on the 15th of this month. Fortunately there is no signs of active infection at this time. 03/13/2020 I do believe patient's wound is actually showing some signs of improvement here which is great news. He has continue with the infusion therapy through rheumatology/dermatology at [facility]. That does seem to be beneficial. I still think he gets as much benefit from this as he did from the prednisone initially but nonetheless obviously this is less harsh on his body that the prednisone as far as they are concerned. 03/31/2020 on evaluation today patient's wound actually showing signs of some pretty good improvement in regard  to the overall appearance of the wound bed. There is still muscle exposed though he does have some epithelial growth around the edges of the wound. Fortunately there is no signs of active infection at this time. No fevers, chills, nausea, vomiting, or diarrhea. 04/24/2020 upon evaluation today patient appears to be doing about the same in regard to his leg ulcer. He has been tolerating the dressing changes without complication. Fortunately there is no signs of active infection at this time. No fevers, chills, nausea, vomiting, or diarrhea. With that being said he still has a lot of irritation from the bandaging around the edges of the wound. We did discuss today the possibility of a referral to plastic surgery. 05/22/2020 on evaluation today patient appears to be doing well with regard to his wounds all things considered. He has not been able  to get the Chantix apparently there is a recall nurse that I was unaware of put out by [employer] involuntarily. Nonetheless for now I am and I have to do some research into what may be the best option for him to help with quitting in regard to smoking and we discussed that today. 06/26/2020 upon evaluation today patient appears to be doing well with regard to his wound from the standpoint of infection I do not see any signs of infection at this point. With that being said unfortunately he is still continuing to have issues with muscle exposure and again he is not having a whole lot of new skin growth unfortunately. There does not appear to be any signs of active infection at this time. No fevers, chills, nausea, vomiting, or diarrhea. 07/10/2020 upon evaluation today patient appears to be doing a little bit more poorly currently compared to where he was previous. I am concerned currently about an active infection that may be getting worse especially in light of the increased size and tenderness of the wound bed. No fevers, chills, nausea, vomiting, or diarrhea. 07/24/2020 upon evaluation today patient appears to be doing poorly in regard to his leg ulcer. He has been tolerating the dressing changes without complication but unfortunately is having a lot of discomfort. 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 Subsequently I think he is going require possibly IV antibiotics to get this under control. I am very concerned about the severity of his infection and the amount of discomfort he is having. 07/31/2020 upon evaluation today patient appears to be doing about the same in regard to his leg wound. He did see Dr. Megan Salon and Dr. Megan Salon is actually going to start him on IV antibiotics. He goes for the PICC line tomorrow. With that being said there do not have that run for 2 weeks and then see how things are doing and depending on how he  is progressing they may extend that a little longer. Nonetheless I am glad this is getting ready to be in place and definitely feel it may help the patient. In the meantime is been using mainly triamcinolone to the wound bed has an anti-inflammatory. 08/07/2020 on evaluation today patient appears to be doing well with regard to his wound compared even last week. In the interim he has gotten the PICC line placed and overall this seems to be doing excellent. There does not appear to be any evidence of infection which is great news systemically although locally of course has had the infection this appears to be improving with the use of the antibiotics. 08/14/2020 upon  evaluation today patient's wound actually showing signs of excellent improvement. Overall the irritation has significantly improved the drainage is back down to more of a normal level and his pain is really pretty much nonexistent compared to what it was. Obviously I think that this is significantly improved secondary to the IV antibiotic therapy which has made all the difference in the world. Again he had a resistant form of Pseudomonas for which oral antibiotics just was not cutting it. Nonetheless I do think that still we need to consider the possibility of a surgical closure for this wound is been open so long and to be honest with muscle exposed I think this can be very hard to get this to close outside of this although definitely were still working to try to do what we can in that regard. 08/21/2020 upon evaluation today patient appears to be doing very well with regard to his wounds on the left lateral lower extremity/calf area. Fortunately there does not appear to be signs of active infection which is great news and overall very pleased with where things stand today. He is actually wrapping up his treatment with IV antibiotics tomorrow. After that we will see where things go from there. 08/28/2020 upon evaluation today patient appears  to be doing decently well with regard to his leg ulcer. There does not appear to be any signs of active infection which is great news and overall very pleased with where things stand today. No fevers, chills, nausea, vomiting, or diarrhea. 09/18/2020 upon evaluation today patient appears to be doing well with regard to his infection which I feel like is better. Unfortunately he is not doing as well with regard to the overall size of the wound which is not nearly as good at this point. I feel like that he may be having an issue here with the pyoderma being somewhat out of control. I think that he may benefit from potentially going back and talking to the dermatologist about GARVIS, DOWNUM ([id number]) what to do from the pyoderma standpoint. I am not certain if the infusions are helping nearly as much is what the prednisone did in the past. 10/02/2020 upon evaluation today patient appears to be doing well with regard to his leg ulcer. He did go to the Psychiatric nurse. Unfortunately they feel like there is a 10% chance that most that he would be able to heal and that the skin graft would take. Obviously this has led him to not be able to go down that path as far as treatment is concerned. Nonetheless he does seem to be doing a little bit better with the prednisone that I gave him last time. I think that he may need to discuss with dermatology the possibility of long-term prednisone as that seems to be what is most helpful for him to be perfectly honest. I am not sure the Remicade is really doing the job. 10/17/2020 upon evaluation today patient appears to be doing a little better in regard to his wound. In fact the case has been since we did the prednisone on May 2 for him that we have noticed a little bit of improvement each time we have seen a size wise as well as appearance wise as well as pain wise. I think the prednisone has had a greater effect then the infusion therapy has to be perfectly honest. With  that being said the patient also feels significantly better compared to what he was previous. All of this is good news but  nonetheless I am still concerned about the fact that again we are really not set up to long-term manage him as far as prednisone is concerned. Obviously there are things that you need to be watched I completely understand the risk of prednisone usage as well. 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 With all that being said I do believe that we can give him another round of the prednisone which she is requesting today because of the improvement that he seen since we did that first round. 10/30/2020 upon evaluation today patient's wound actually is showing signs of doing quite well. There does not appear to be any evidence of infection which is great news and overall very pleased with where things stand today. No fevers, chills, nausea, vomiting, or diarrhea. He tells me that the prednisone still has seem to have helped he wonders if we can extend that for just a little bit longer. He did not have the appointment with a dermatologist although he did have an infusion appointment last Friday. That was at [facility]. With that being said he tells me he could not do both that as well as the appointment with the physician on the same day therefore that is can have to be rescheduled. I really want to see if there is anything they feel like that could be done differently to try to help this out as I am not really certain that the infusions are helping significantly here. Electronic Signature(s) Signed: 10/30/2020 1:16:30 PM By: Worthy Keeler PA-C Previous Signature: 10/30/2020 1:15:54 PM Version By: Worthy Keeler PA-C Entered By: Worthy Keeler on 10/30/2020 13:16:30 [name] ([id number]) -------------------------------------------------------------------------------- Physical Exam Details Patient Name: [name] Date of Service:  10/30/2020 8:30 AM Medical Record Number: [id number] Patient Account Number: [id number] Date of Birth/Sex: Jan 24, 1981 (39 y.o. Male) Treating RN: Donnamarie Poag Primary Care Provider: [name] Other Clinician: Referring Provider: [name] Treating Provider/Extender: [name] Cliche in Treatment: 203 Constitutional Obese and well-hydrated in no acute distress. Respiratory normal breathing without difficulty. Psychiatric this patient is able to make decisions and demonstrates good insight into disease process. Alert and Oriented x 3. pleasant and cooperative. Notes Upon inspection patient's wound bed still shows signs of significant muscle involvement at this point. There does not appear to be any evidence of active infection. He has not gotten any of the compression socks that were recommended for stronger compression from elastic therapy we were considering a dual layer. We did give him that information. With that being said he does want to see about extending the prednisone for a little bit longer I think we could do that but really this will probably be the last prescription that we can do for a while as I do not want to get his system set on this currently. Electronic Signature(s) Signed: 10/30/2020 1:17:07 PM By: Worthy Keeler PA-C Entered By: Worthy Keeler on 10/30/2020 13:17:07 [name] ([id number]) -------------------------------------------------------------------------------- Physician Orders Details Patient Name: [name] Date of Service: 10/30/2020 8:30 AM Medical Record Number: [id number] Patient Account Number: [id number] Date of Birth/Sex: [birth year] (39 y.o. Male) Treating RN: Donnamarie Poag Primary Care Provider: [name] Other Clinician: Referring Provider: [name] Treating Provider/Extender: [name] Cliche in Treatment: 203 Verbal / Phone Orders: No Diagnosis Coding ICD-10 Coding Code Description [phone] Non-pressure chronic  ulcer of left calf with fat layer exposed L88 Pyoderma gangrenosum L97.321 Non-pressure chronic ulcer of left ankle limited to  breakdown of skin I87.2 Venous insufficiency (chronic) (peripheral) L03.116 Cellulitis of left lower limb F17.208 Nicotine dependence, unspecified, with other nicotine-induced disorders Follow-up Appointments o Return Appointment in 2 weeks. Bathing/ Shower/ Hygiene o Clean wound with Normal Saline or wound cleanser. Medications-Please add to medication list. o Other: - Prednisone taper to be sent to pharmacy for 12 day taper, please start and follow instructions Wound Treatment Wound #1 - Lower Leg Wound Laterality: Left, Lateral Cleanser: Soap and Water Discharge Instructions: Gently cleanse wound with antibacterial soap, rinse and pat dry prior to dressing wounds Topical: Triamcinolone Acetonide Cream, 0.1%, 15 (g) tube Discharge Instructions: Apply as directed by provider. Primary Dressing: Foam Dressing, 4x4 (in/in) Secured With: Tubigrip Size F, 4x10 (in/yd) Discharge Instructions: Apply DOUBLE Tubigrip F 3 finger-widths below knee to base of toes to secure dressing and/or for swelling. Patient Medications Allergies: milk, Biaxin, seasonal Notifications Medication Indication Start End prednisone 10/30/2020 DOSE 48 - oral 10 mg tablets,dose pack - 48 tablets, 12 day tapering dose pack by mouth Electronic Signature(s) Signed: 10/30/2020 9:23:43 AM By: Donnamarie Poag Signed: 10/30/2020 5:46:17 PM By: Worthy Keeler PA-C Previous Signature: 10/30/2020 9:11:30 AM Version By: Donnamarie Poag Previous Signature: 10/30/2020 9:09:37 AM Version By: Worthy Keeler PA-C Entered By: Donnamarie Poag on 10/30/2020 09:23:43 Nolte, [name] ([id number]) -------------------------------------------------------------------------------- Problem List Details Patient Name: [name] Date of Service: 10/30/2020 8:30 AM Medical Record Number: [id number] Patient Account Number:  [id number] Date of Birth/Sex: [birth year] (40 y.o. Male) Treating RN: Donnamarie Poag Primary Care Provider: [name] Other Clinician: Referring Provider: [name] Treating Provider/Extender: [name] Cliche in Treatment: 203 Active Problems ICD-10 Encounter Code Description Active Date MDM Diagnosis L97.222 Non-pressure chronic ulcer of left calf with fat layer exposed 12/04/2016 No Yes L88 Pyoderma gangrenosum 03/26/2017 No Yes L97.321 Non-pressure chronic ulcer of left ankle limited to breakdown of skin 10/08/2019 No Yes I87.2 Venous insufficiency (chronic) (peripheral) 12/04/2016 No Yes L03.116 Cellulitis of left lower limb 05/24/2019 No Yes F17.208 Nicotine dependence, unspecified, with other nicotine-induced disorders 04/24/2020 No Yes Inactive Problems ICD-10 Code Description Active Date Inactive Date L97.213 Non-pressure chronic ulcer of right calf with necrosis of muscle 04/02/2017 04/02/2017 Resolved Problems Electronic Signature(s) Signed: 10/30/2020 5:46:17 PM By: Worthy Keeler PA-C Entered By: Worthy Keeler on 10/30/2020 08:38:51 Galliano ([id number]) -------------------------------------------------------------------------------- Progress Note Details Patient Name: [name] Date of Service: 10/30/2020 8:30 AM Medical Record Number: [id number] Patient Account Number: [id number] Date of Birth/Sex: 12-27-1980 (39 y.o. Male) Treating RN: Donnamarie Poag Primary Care Provider: [name] Other Clinician: Referring Provider: [name] Treating Provider/Extender: [name] Cliche in Treatment: 203 Subjective Chief Complaint Information obtained from Patient He is here in follow up evaluation for LLE pyoderma ulcer History of Present Illness (HPI) 12/04/16; 40 year old man who comes into the clinic today for review of a wound on the posterior left calf. He tells me that is been there for about a year. He is not a diabetic he does smoke half a pack  per day. He was seen in the ER on 11/20/16 felt to have cellulitis around the wound and was given clindamycin. An x-ray did not show osteomyelitis. The patient initially tells me that he has a milk allergy that sets off a pruritic itching rash on his lower legs which she scratches incessantly and he thinks that's what may have set up the wound. He has been using various topical antibiotics and ointments without any effect. He works in a [employer]ing [employer] and is on his feet  all day. He does not have a prior history of wounds however he does have the rash on both lower legs the right arm and the ventral aspect of his left arm. These are excoriations and clearly have had scratching however there are of macular looking areas on both legs including a substantial larger area on the right leg. This does not have an underlying open area. There is no blistering. The patient tells me that 2 years ago in [place] in response to the rash on his legs he saw a dermatologist who told him he had a condition which may be pyoderma gangrenosum although I may be putting words into his mouth. He seemed to recognize this. 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 He is not in any treatment for this. He's had no recent travel 12/11/16; the patient arrives today with his wound and roughly the same condition we've been using silver alginate this is a deep punched out wound with some surrounding erythema but no tenderness. Biopsy I did did not show confirmed pyoderma gangrenosum suggested nonspecific inflammation and vasculitis but does not provide an actual description of what was seen by the pathologist. I'm really not able to understand this We have also received information from the patient's dermatologist in [place] notes from April 2016. This was a doctor Agarwal-antal. 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 He was prescribed topical steroid high potency under occlusion  which helped but at this point the patient did not have a deep punched out wound. 12/18/16; the patient's wound is larger in terms of surface area however this surface looks better and there is less depth. The surrounding erythema also is better. The patient states that the wrap we put on came off 2 days ago when he has been using his compression stockings. He we are in the process of getting a dermatology consult. 12/26/16 on evaluation today patient's left lower extremity wound shows evidence of infection with surrounding erythema noted. He has been tolerating the dressing changes but states that he has noted more discomfort. There is a larger area of erythema surrounding the wound. No fevers, chills, nausea, or vomiting noted at this time. With that being said the wound still does have slough covering the surface. He is not allergic to any medication that he is aware of at this point. In regard to his right lower extremity he had several regions that are erythematous and pruritic he wonders if there's anything we can do to help that. 01/02/17 I reviewed patient's wound culture which was obtained his visit last week. He was placed on doxycycline at that point. Unfortunately that does not appear to be an antibiotic that would likely help with the situation however the pseudomonas noted on culture is sensitive to Cipro. Also unfortunately patient's wound seems to have a large compared to last week's evaluation. Not severely so but there are definitely increased measurements in general. He is continuing to have discomfort as well he writes this to be a seven out of 10. In fact he would prefer me not to perform any debridement today due to the fact that he is having discomfort and considering he has an active infection on the little reluctant to do so anyway. No fevers, chills, nausea, or vomiting noted at this time. 01/08/17; patient seems dermatology on September 5. I suspect dermatology will want the  slides from the biopsy I did sent to their pathologist. I'm not sure if there  is a way we can expedite that. In any case the culture I did before I left on vacation 3 weeks ago showed Pseudomonas he was given 10 days of Cipro and per her description of her intake nurses is actually somewhat better this week although the wound is quite a bit bigger than I remember the last time I saw this. He still has 3 more days of Cipro 01/21/17; dermatology appointment tomorrow. He has completed the ciprofloxacin for Pseudomonas. Surface of the wound looks better however he is had some deterioration in the lesions on his right leg. Meantime the left lateral leg wound we will continue with sample 01/29/17; patient had his dermatology appointment but I can't yet see that note. He is completed his antibiotics. The wound is more superficial but considerably larger in circumferential area than when he came in. This is in his left lateral calf. He also has swollen erythematous areas with superficial wounds on the right leg and small papular areas on both arms. There apparently areas in her his upper thighs and buttocks I did not look at those. Dermatology biopsied the right leg. Hopefully will have their input next week. 02/05/17; patient went back to see his dermatologist who told him that he had a "scratching problem" as well as staph. He is now on a 30 day course of doxycycline and I believe she gave him triamcinolone cream to the right leg areas to help with the itching [not exactly sure but probably triamcinolone]. She apparently looked at the left lateral leg wound although this was not rebiopsied and I think felt to be ultimately part of the same pathogenesis. He is using sample border foam and changing nevus himself. He now has a new open area on the right posterior leg which was his biopsy site I don't have any of the dermatology notes 02/12/17; we put the patient in compression last week with SANTYL to the wound on  the left leg and the biopsy. Edema is much better and the depth of the wound is now at level of skin. Area is still the same Biopsy site on the right lateral leg we've also been using santyl with a border foam dressing and he is changing this himself. 02/19/17; Using silver alginate started last week to both the substantial left leg wound and the biopsy site on the right wound. He is tolerating compression well. Has a an appointment with his primary M.D. tomorrow wondering about diuretics although I'm wondering if the edema problem is actually lymphedema NIC, LAMPE ([id number]) 02/26/17; the patient has been to see his primary doctor Dr. Jerrel Ivory at [facility] our primary care. She started him on Lasix 20 mg and this seems to have helped with the edema. However we are not making substantial change with the left lateral calf wound and inflammation. The biopsy site on the right leg also looks stable but not really all that different. 03/12/17; the patient has been to see vein and vascular Dr. Lucky Cowboy. He has had venous reflux studies I have not reviewed these. I did get a call from his dermatology office. 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 They therefore recommended clobetasol and tetracycline i.e. 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  03/26/17; apparently the patient just had reflux studies not an appointment with Dr. dew. She arrives in clinic today having applied clobetasol for 2-3 weeks. He notes over the last 2-3 days excessive drainage having to change the dressing 3-4 times a day and also expanding erythema. He states the expanding erythema seems to come and go and was last this red was earlier in the  month.he is on doxycycline 150 mg twice a day as an anti-inflammatory systemic therapy for possible pyoderma gangrenosum along with the topical clobetasol 04/02/17; the patient was seen last week by Dr. Lillia Carmel at [facility] dermatology locally who kindly saw him at my request. 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 My concern was the degree of erythema medially extending from his left leg wound which was either inflammation from pyoderma or cellulitis. I put him on Augmentin however culture of the wound showed Pseudomonas which is quinolone sensitive. I really don't believe he has cellulitis however in view of everything I will continue and give him a course of Cipro. He is also on doxycycline as an immune modulator for the pyoderma. In addition to his original wound on the left lateral leg with surrounding erythema he has a wound on the right posterior calf which was an original biopsy site done by dermatology. 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 Saw Dr. Lillia Carmel yesterday. He has been using topical antibiotics to both wound areas his original wound on the left and the biopsies/pathergy area on the right. There is definitely some improvement in the inflammation around the wound on the right although the patient states he has increasing sensitivity of the wounds. He is on prednisone 60 and doxycycline 1 as prescribed by Dr. Lillia Carmel. He is covering the topical antibiotic with gauze and putting this in his own compression stocks and changing this daily. He states that Dr. Lottie Rater did a culture of the left leg wound yesterday 05/07/17; pyoderma gangrenosum. The patient saw Dr. Lillia Carmel yesterday and has a follow-up with her in one month. He is still using topical antibiotics to both wounds although he can't recall exactly what type. He is still on prednisone 60 mg. Dr.  Lillia Carmel stated that the doxycycline could stop if we were in agreement. 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 The right medial leg was induced by biopsy/pathergy. The area on the right is essentially healed. 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 The right medial leg has closed and remains closed. He is still on prednisone 60. He tells me he missed his last dermatology appointment with Dr. Lillia Carmel but will make another appointment. He reports that her blood sugar at a recent screen in [place] was high 200's. He was 180 today. 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 I think he is going to require still much longer prednisone perhaps another 3 months before attempting to taper. In the meantime his wound is a lot better. Smaller. He is cleaning this off daily and applying topical antibiotics. When he was last in the clinic I thought about changing to [facility] and actually put in a couple of calls to dermatology although probably not during their business hours. In any case the wound looks better smaller I don't think there is  any need to change what he is doing 08/06/17-he is here in follow up evaluation for pyoderma left leg ulcer. He continues on oral prednisone. He has been using triple antibiotic ointment. There is surface debris and we will transition to [facility] and have him return in 2 weeks. He has lost 30 pounds since his last appointment with lifestyle modification. He may benefit from topical steroid cream for treatment this can be considered at a later date. 08/22/17 on evaluation today patient appears to actually be doing rather well in regard to his left lateral lower extremity ulcer. He has actually been managed by Dr. Dellia Nims most recently. Patient is currently on oral  steroids at this time. This seems to have been of benefit for him. Nonetheless his last visit was actually with Leah on 08/06/17. Currently he is not utilizing any topical steroid creams although this could be of benefit as well. No fevers, chills, nausea, or vomiting noted at this time. 09/05/17 on evaluation today patient appears to be doing better in regard to his left lateral lower extremity ulcer. He has been tolerating the dressing changes without complication. He is using Santyl with good effect. Overall I'm very pleased with how things are standing at this point. Patient likewise is happy that this is doing better. 09/19/17 on evaluation today patient actually appears to be doing rather well in regard to his left lateral lower extremity ulcer. 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 He's not having any significant pain. 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 He has been tolerating the Santyl without complication and in general I feel like he's making good progress. 10/17/17 on evaluation today patient appears to be doing very well in regard to his left lateral lower surety ulcer. He has been tolerating the dressing changes without complication. There does not appear to be any evidence of infection he's alternating the Santyl and the triple antibiotic ointment every other day this seems to be doing well for him. 11/03/17 on evaluation today patient appears to be doing very well in regard to his left lateral lower extremity ulcer. He is been tolerating the dressing changes without complication which is good news. Fortunately there does not appear to be any evidence of infection which is also great news. Overall is doing excellent they are starting to taper down on the prednisone is down to 40 mg at this point  it also started topical clobetasol for him. 11/17/17 on evaluation today patient appears to be doing well in regard to his left lateral lower surety ulcer. He's been tolerating the dressing changes without complication. He does note that he is having no pain, no excessive drainage or discharge, and overall he feels like things are going about how he would expect and hope they would. Overall he seems to have no evidence of infection at this time in my opinion which is good news. 12/04/17-He is seen in follow-up evaluation for right lateral lower extremity ulcer. He has been applying topical steroid cream. Today's measurement show slight increase in size. Over the next 2 weeks we will transition to every other day Santyl and steroid cream. He has been encouraged to monitor for changes and notify clinic with any concerns 12/15/17 on evaluation today patient's left lateral motion the ulcer and fortunately is doing worse again at this point.  This just since last week to this week has close to doubled in size according to the patient. I did not seeing last week's I do not have a visual to compare this to in our system was also down so we do not have all the charts and at this point. Nonetheless it does have me somewhat concerned in regard to the fact that again he was worried enough about it he has contact the dermatology that placed them back on the full strength, 50 mg a day of the prednisone that he was taken previous. He continues to alternate using clobetasol along with Santyl at this point. He is obviously somewhat frustrated. LESHON, ARMISTEAD ([id number]) 12/22/17 on evaluation today patient appears to be doing a little worse compared to last evaluation. Unfortunately the wound is a little deeper and slightly larger than the last week's evaluation. With that being said he has made some progress in regard to the irritation surrounding at this time unfortunately despite that progress that's been made he still  has a significant issue going on here. 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 For that reason I discussed with him today the possibility of perform a wound culture to ensure there's nothing overtly infected. 01/06/18 on evaluation today patient's wound is larger and deeper than previously evaluated. With that being said it did appear that his wound was infected after my last evaluation with him. Subsequently I did end up prescribing a prescription for Bactrim DS which she has been taking and having no complication with. Fortunately there does not appear to be any evidence of infection at this point in time as far as anything spreading, no want to touch, and overall I feel like things are showing signs of improvement. 01/13/18 on evaluation today patient appears to be even a little larger and deeper than last time. There still muscle exposed in the base of the wound. Nonetheless he does appear to be less erythematous I do believe inflammation is calming down also believe the infection looks like it's probably resolved at this time based on what I'm seeing. No fevers, chills, nausea, or vomiting noted at this time. 01/30/18 on evaluation today patient actually appears to visually look better for the most part. Unfortunately those visually this looks better he does seem to potentially have what may be an abscess in the muscle that has been noted in the central portion of the wound. This is the first time that I have noted what appears to be fluctuance in the central portion of the muscle. With that being said I'm somewhat more concerned about the fact that this might indicate an abscess formation at this location. I do believe that an ultrasound would be appropriate. This is likely something we need to try to do as soon as possible. He has been switch to  mupirocin ointment and he is no longer using the steroid ointment as prescribed by dermatology he sees them again next week he's been decreased from 60 to 40 mg of prednisone. 03/09/18 on evaluation today patient actually appears to be doing a little better compared to last time I saw him. There's not as much erythema surrounding the wound itself. He I did review his most recent infectious disease note which was dated 02/24/18. He saw Dr. Michel Bickers in [place]. 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 Patient is now off of antibiotics and they are continually observing this. There seems to be no change in the past two weeks in my pinion based on what the patient says and what I see today compared to what Dr. Megan Salon likely saw two weeks ago. No fevers, chills, nausea, or vomiting noted at this time. 03/23/18 on evaluation today patient's wound actually appears to be showing signs of improvement which is good news. He is currently still on the Dapsone. He is also working on tapering the prednisone to get off of this and Dr. Lottie Rater is working with him in this regard. Nonetheless overall I feel like the wound is doing well it does appear based on the infectious disease note that I reviewed from Dr. 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 I did also review the results of his ultrasound of left lower extremity which revealed there was a dentist tissue in the base of the wound without an abscess noted. 04/06/18 on evaluation today the patient's left lateral lower extremity ulcer actually appears to be doing fairly well which is excellent news. There does not appear to be any evidence of infection at this time which is also great news. Overall he still does have a significantly large  ulceration although little by little he seems to be making progress. He is down to 10 mg a day of the prednisone. 04/20/18 on evaluation today patient actually appears to be doing excellent at this time in regard to his left lower extremity ulcer. He's making signs of good progress unfortunately this is taking much longer than we would really like to see but nonetheless he is making progress. Fortunately there does not appear to be any evidence of infection at this time. No fevers, chills, nausea, or vomiting noted at this time. The patient has not been using the Santyl due to the cost he hadn't got in this field yet. He's mainly been using the antibiotic ointment topically. Subsequently he also tells me that he really has not been scrubbing in the shower I think this would be helpful again as I told him it doesn't have to be anything too aggressive to even make it believe just enough to keep it free of some of the loose slough and biofilm on the wound surface. 05/11/18 on evaluation today patient's wound appears to be making slow but sure progress in regard to the left lateral lower extremity ulcer. He is been tolerating the dressing changes without complication. Fortunately there does not appear to be any evidence of infection at this time. He is still just using triple antibiotic ointment along with clobetasol occasionally over the area. He never got the Santyl and really does not seem to intend to in my pinion. 06/01/18 on evaluation today patient appears to be doing a little better in regard to his left lateral lower extremity ulcer. He states that overall he does not feel like he is doing as well with the Dapsone as he did with the prednisone. Nonetheless he sees his dermatologist later today and is gonna talk to them about the possibility of going back on the prednisone. Overall again I believe that the wound would be better if you would utilize Santyl but he really does not seem to be interested  in going back to the  Santyl at this point. He has been using triple antibiotic ointment. 06/15/18 on evaluation today patient's wound actually appears to be doing about the same at this point. Fortunately there is no signs of infection at this time. He has made slight improvements although he continues to not really want to clean the wound bed at this point. He states that he just doesn't mess with it he doesn't want to cause any problems with everything else he has going on. He has been on medication, antibiotics as prescribed by his dermatologist, for a staff infection of his lower extremities which is really drying out now and looking much better he tells me. Fortunately there is no sign of overall infection. 06/29/18 on evaluation today patient appears to be doing well in regard to his left lateral lower surety ulcer all things considering. Fortunately his staff infection seems to be greatly improved compared to previous. He has no signs of infection and this is drying up quite nicely. He is still the doxycycline for this is no longer on cental, Dapsone, or any of the other medications. His dermatologist has recommended possibility of an infusion but right now he does not want to proceed with that. 07/13/18 on evaluation today patient appears to be doing about the same in regard to his left lateral lower surety ulcer. Fortunately there's no signs of infection at this time which is great news. Unfortunately he still builds up a significant amount of Slough/biofilm of the surface of the wound he still is not really cleaning this as he should be appropriately. Again I'm able to easily with saline and gauze remove the majority of this on the surface which if you would do this at home would likely be a dramatic improvement for him as far as getting the area to improve. Nonetheless overall I still feel like he is making progress is just very slow. I think Santyl will be of benefit for him as well. Still he  has not gotten this as of this point. 07/27/18 on evaluation today patient actually appears to be doing little worse in regards of the erythema around the periwound region of the wound he also tells me that he's been having more drainage currently compared to what he was experiencing last time I saw him. He states not quite as bad as what he had because this was infected previously but nonetheless is still appears to be doing poorly. Fortunately there is no evidence of systemic infection at this point. The patient tells me that he is not going to be able to afford the Santyl. He is still waiting to hear about the infusion therapy with his dermatologist. Apparently she wants an updated colonoscopy first. RASHAWD, LASKARIS ([id number]) 08/10/18 on evaluation today patient appears to be doing better in regard to his left lateral lower extremity ulcer. Fortunately he is showing signs of improvement in this regard he's actually been approved for Remicade infusion's as well although this has not been scheduled as of yet. Fortunately there's no signs of active infection at this time in regard to the wound although he is having some issues with infection of the right lower extremity is been seen as dermatologist for this. Fortunately they are definitely still working with him trying to keep things under control. 09/07/18 on evaluation today patient is actually doing rather well in regard to his left lateral lower extremity ulcer. He notes these actually having some hair grow back on his extremity which is something he has not seen in  years. He also tells me that the pain is really not giving them any trouble at this time which is also good news overall she is very pleased with the progress he's using a combination of the mupirocin along with the probate is all mixed. 09/21/18 on evaluation today patient actually appears to be doing fairly well all things considered in regard to his looks from the ulcer. He's  been tolerating the dressing changes without complication. Fortunately there's no signs of active infection at this time which is good news he is still on all antibiotics or prevention of the staff infection. He has been on prednisone for time although he states it is gonna contact his dermatologist and see if she put them on a short course due to some irritation that he has going on currently. Fortunately there's no evidence of any overall worsening this is going very slow I think cental would be something that would be helpful for him although he states that $50 for tube is quite expensive. He therefore is not willing to get that at this point. 10/06/18 on evaluation today patient actually appears to be doing decently well in regard to his left lateral leg ulcer. He's been tolerating the dressing changes without complication. Fortunately there's no signs of active infection at this time. Overall I'm actually rather pleased with the progress he's making although it's slow he doesn't show any signs of infection and he does seem to be making some improvement. I do believe that he may need a switch up and dressings to try to help this to heal more appropriately and quickly. 10/19/18 on evaluation today patient actually appears to be doing better in regard to his left lateral lower extremity ulcer. This is shown signs of having much less Slough buildup at this point due to the fact he has been using the [employer]. Obviously this is very good news. The overall size of the wound is not dramatically smaller but again the appearance is. 11/02/18 on evaluation today patient actually appears to be doing quite well in regard to his lower [place] ulcer. A lot of the skin around the ulcer is actually somewhat irritating at this point this seems to be more due to the dressing causing irritation from the adhesive that anything else. Fortunately there is no signs of active infection at this time. 11/24/18 on evaluation  today patient appears to be doing a little worse in regard to his overall appearance of his lower extremity ulcer. There's more erythema and warmth around the wound unfortunately. He is currently on doxycycline which he has been on for some time. With that being said I'm not sure that seems to be helping with what appears to possibly be an acute cellulitis with regard to his left lower extremity ulcer. No fevers, chills, nausea, or vomiting noted at this time. 12/08/18 on evaluation today patient's wounds actually appears to be doing significantly better compared to his last evaluation. He has been using Santyl along with alternating tripling about appointment as well as the steroid cream seems to be doing quite well and the wound is showing signs of improvement which is excellent news. Fortunately there's no evidence of infection and in fact his culture came back negative with only normal skin flora noted. 12/21/2018 upon evaluation today patient actually appears to be doing excellent with regard to his ulcer. This is actually the best that I have seen it since have been helping to take care of him. It is both smaller as well  as less slough noted on the surface of the wound and seems to be showing signs of good improvement with new skin growing from the edges. He has been using just the triamcinolone he does wonder if he can get a refill of that ointment today. 01/04/2019 upon evaluation today patient actually appears to be doing well with regard to his left lateral lower extremity ulcer. With that being said it does not appear to be that he is doing quite as well as last time as far as progression is concerned. There does not appear to be any signs of infection or significant irritation which is good news. With that being said I do believe that he may benefit from switching to a collagen based dressing based on how clean The wound appears. 01/18/2019 on evaluation today patient actually appears to be  doing well with regard to his wound on the left lower extremity. He is not made a lot of progress compared to where we were previous but nonetheless does seem to be doing okay at this time which is good news. There is no signs of active infection which is also good news. My only concern currently is I do wish we can get him into utilizing the collagen dressing his insurance would not pay for the supplies that we ordered although it appears that he may be able to order this through his supply company that he typically utilizes. This is [employer]. Nonetheless he did try to order it during the office visit today and it appears this did go through. We will see if he can get that it is a different brand but nonetheless he has collagen and I do think will be beneficial. 02/01/2019 on evaluation today patient actually appears to be doing a little worse today in regard to the overall size of his wounds. Fortunately there is no signs of active infection at this time. That is visually. Nonetheless when this is happened before it was due to infection. For that reason were somewhat concerned about that this time as well. 02/08/2019 on evaluation today patient unfortunately appears to be doing slightly worse with regard to his wound upon evaluation today. Is measuring a little deeper and a little larger unfortunately. I am not really sure exactly what is causing this to enlarge he actually did see his dermatologist she is going to see about initiating Humira for him. Subsequently she also did do steroid injections into the wound itself in the periphery. Nonetheless still nonetheless he seems to be getting a little bit larger he is gone back to just using the steroid cream topically which I think is appropriate. I would say hold off on the collagen for the time being is definitely a good thing to do. Based on the culture results which we finally did get the final result back regarding it shows staph as the bacteria  noted again that can be a normal skin bacteria based on the fact however he is having increased drainage and worsening of the wound measurement wise I would go ahead and place him on an antibiotic today I do believe for this. 02/15/2019 on evaluation today patient actually appears to be doing somewhat better in regard to his ulcer. There is no signs of worsening at this time I did review his culture results which showed evidence of Staphylococcus aureus but not MRSA. Again this could just be more related to the normal skin bacteria although he states the drainage has slowed down quite a bit he may  have had a mild infection not just colonization. And was much smaller and then since around10/04/2019 on evaluation today patient appears to be doing unfortunately worse as far as the size of the wound. I really feel like that this is steadily getting larger again it had been doing excellent right at the beginning of September we have seen a steady increase in the area of the wound it is almost 2-1/2 times the size it was on September 1. Obviously this is a bad trend this is not wanting to see. For that reason we went back to using just the topical triamcinolone cream which does seem to help with inflammation. I checked him for bacteria by way of culture and nothing showed positive there. I am considering giving him a short course of a tapering steroid Jamario Colina, Maxson ([id number]) today to see if that is can be beneficial for him. The patient is in agreement with giving that a try. 03/08/2019 on evaluation today patient appears to be doing very well in comparison to last evaluation with regard to his lower extremity ulcer. This is showing signs of less inflammation and actually measuring slightly smaller compared to last time every other week over the past month and a half he has been measuring larger larger larger. Nonetheless I do believe that the issue has been inflammation the prednisone does seem  to have been beneficial for him which is good news. No fevers, chills, nausea, vomiting, or diarrhea. 03/22/2019 on evaluation today patient appears to be doing about the same with regard to his leg ulcer. He has been tolerating the dressing changes without complication. With that being said the wound seems to be mostly arrested at its current size but really is not making any progress except for when we prescribed the prednisone. He did show some signs of dropping as far as the overall size of the wound during that interval week. Nonetheless this is something he is not on long-term at this point and unfortunately I think he is getting need either this or else the Humira which his dermatologist has discussed try to get approval for. With that being said he will be seeing his dermatologist on the 11th of this month that is November. 04/19/2019 on evaluation today patient appears to be doing really about the same the wound is measuring slightly larger compared to last time I saw him. He has not been into the office since November 2 due to the fact that he unfortunately had Covid as that his entire family. He tells me that it was rough but they did pull-through and he seems to be doing much better. Fortunately there is no signs of active infection at this time. No fevers, chills, nausea, vomiting, or diarrhea. 05/10/2019 on evaluation today patient unfortunately appears to be doing significantly worse as compared to last time I saw him. He does tell me that he has had his first dose of Humira and actually is scheduled to get the next one in the upcoming week. With that being said he tells me also that in the past several days he has been having a lot of issues with green drainage she showed me a picture this is more blue-green in color. He is also been having issues with increased sloughy buildup and the wound does appear to be larger today. Obviously this is not the direction that we want everything to  take based on the starting of his Humira. Nonetheless I think this is definitely a result of likely  infection and to be honest I think this is probably Pseudomonas causing the infection based on what I am seeing. 05/24/2019 on evaluation today patient unfortunately appears to be doing significantly worse compared to his prior evaluation with me 2 weeks ago. I did review his culture results which showed that he does have Staph aureus as well as Pseudomonas noted on the culture. Nonetheless the Levaquin that I prescribed for him does not appear to have been appropriate and in fact he tells me he is no longer experiencing the green drainage and discharge that he had at the last visit. Fortunately there is no signs of active infection at this time which is good news although the wound has significantly worsened it in fact is much deeper than it was previous. We have been utilizing up to this point triamcinolone ointment as the prescription topical of choice but at this time I really feel like that the wound is getting need to be packed in order to appropriately manage this due to the deeper nature of the wound. Therefore something along the lines of an alginate dressing may be more appropriate. 05/31/2019 upon inspection today patient's wound actually showed signs of doing poorly at this point. Unfortunately he just does not seem to be making any good progress despite what we have tried. He actually did go ahead and pick up the Cipro and start taking that as he was noticing more green drainage he had previously completed the Levaquin that I prescribed for him as well. Nonetheless he missed his appointment for the seventh last week on Wednesday with the wound care center and [facility] where his dermatologist referred him. Obviously I do think a second opinion would be helpful at this point especially in light of the fact that the patient seems to be doing so poorly despite the fact that we have tried  everything that I really know how at this point. The only thing that ever seems to have helped him in the past is when he was on high doses of continual steroids that did seem to make a difference for him. 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 06/07/2019 upon evaluation today patient unfortunately appears to be doing worse yet again with regard to his wound. In fact I am starting to question whether or not he may have a fluid pocket in the muscle at this point based on the bulging and the soft appearance to the central portion of the muscle area. There is not anything draining from the muscle itself at this time which is good news but nonetheless the wound is expanding. I am not really seeing any results of the Humira as far as overall wound progression based on what I am seeing at this point. The patient has been referred for second opinion with regard to his wound to the [facility] wound care center by his dermatologist which I definitely am not in opposition to. Unfortunately we tried multiple dressings in the past including collagen, alginate, and at one point even Hydrofera Blue. With that being said he is never really used it for any significant amount of time due to the fact that he often complains of pain associated with these dressings and then will go back to either using the Santyl which she has done intermittently or more  frequently the triamcinolone. He is also using his own compression stockings. We have wrapped him in the past but again that was something else that he really was not a big fan of. 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 He seemed to do much better when he was on the steroids although honestly I understand that the steroids are not necessarily the best medication to be on long-term obviously 06/14/2019 on evaluation today patient appears to be doing actually a little bit better with regard to the overall appearance with his leg. Unfortunately he does continue to have issues with what appears to be some fluid underneath the muscle although he did see the wound specialty center at [facility] last week their main goals were to see about infusion therapy in place of the Humira as they feel like that is not quite strong enough. They also recommended that we continue with the treatment otherwise as we are they felt like that was appropriate and they are okay with him continuing to follow-up here with [place] in that regard. With that being said they are also sending him to the vein specialist there to see about vein stripping and if that would be of benefit for him. Subsequently they also did not really address whether or not an ultrasound of the muscle area to see if there is anything that needs to be addressed here would be appropriate or not. For that reason I discussed this with him last week I think we may proceed down that road at this point. 06/21/2019 upon evaluation today patient's wound actually appears to be doing slightly better compared to previous evaluations. I do believe that he has made a difference with regard to the progression here with the use of oral steroids. Again in the past has been the only thing that is really calm things down. He does tell me that from [facility] is gotten a good news from there that there are no further vein stripping that is necessary at this point. I do not have that available for review today although the patient did relay this to me. He also did obtain and have the  ultrasound of the wound completed which I did sign off on today. It does appear that there is no fluid collection under the muscle this is likely then just edematous tissue in general. That is also good news. Overall I still believe the inflammation is the main issue here. He did inquire about the possibility of a wound VAC again with the muscle protruding like it is I am not really sure whether the wound VAC is necessarily ideal or not. That is something we will have to consider although I do believe he may need compression wrapping to try to help with edema control which could potentially be of benefit. 06/28/2019 on evaluation today patient appears to be doing slightly better measurement wise although this is not terribly smaller he least seems to be trending towards that direction. With that being said he still seems to have purulent drainage noted in the wound bed at this time. He has been on Levaquin followed by Cipro over the past month. Unfortunately  he still seems to have some issues with active infection at this time. I did perform a culture last week in order to evaluate and see if indeed there was still anything going on. Subsequently the culture did come back Radcliffe, Molly ([id number]) showing Pseudomonas which is consistent with the drainage has been having which is blue-green in color. He also has had an odor that again was somewhat consistent with Pseudomonas as well. Long story short it appears that the culture showed an intermediate finding with regard to how well the Cipro will work for the Pseudomonas infection. Subsequently being that he does not seem to be clearing up and at best what we are doing is just keeping this at [place] I think he may need to see infectious disease to discuss IV antibiotic options. 07/05/2019 upon evaluation today patient appears to be doing okay in regard to his leg ulcer. He has been tolerating the dressing changes at this point without complication.  Fortunately there is no signs of active infection at this time which is good news. No fevers, chills, nausea, vomiting, or diarrhea. With that being said he does have an appointment with infectious disease tomorrow and his primary care on Wednesday. Again the reason for the infectious disease referral was due to the fact that he did not seem to be fully resolving with the use of oral antibiotics and therefore we were thinking that IV antibiotic therapy may be necessary secondary to the fact that there was an intermediate finding for how effective the Cipro may be. Nonetheless again he has been having a lot of purulent and even green drainage. Fortunately right now that seems to have calmed down over the past week with the reinitiation of the oral antibiotic. Nonetheless we will see what Dr. Megan Salon has to say. 07/12/2019 upon evaluation today patient appears to be doing about the same at this point in regard to his left lower extremity ulcer. Fortunately there is no signs of active infection at this time which is good news I do believe the Levaquin has been beneficial I did review Dr. Hale Bogus note and to be honest I agree that the patient's leg does appear to be doing better currently. What we found in the past as he does not seem to really completely resolve he will stop the antibiotic and then subsequently things will revert back to having issues with blue-green drainage, increased pain, and overall worsening in general. Obviously that is the reason I sent him back to infectious disease. 07/19/2019 upon evaluation today patient appears to be doing roughly the same in size there is really no dramatic improvement. He has started back on the Levaquin at this point and though he seems to be doing okay he did still have a lot of blue/green drainage noted on evaluation today unfortunately. I think that this is still indicative more likely of a Pseudomonas infection as previously noted and again he does see  Dr. Megan Salon in just a couple of days. I do not know that were really able to effectively clear this with just oral antibiotics alone based on what I am seeing currently. Nonetheless we are still continue to try to manage as best we can with regard to the patient and his wound. I do think the wrap was helpful in decreasing the edema which is excellent news. No fevers, chills, nausea, vomiting, or diarrhea. 07/26/2019 upon evaluation today patient appears to be doing slightly better with regard to the overall appearance of the muscle there is  no dark discoloration centrally. Fortunately there is no signs of active infection at this time. No fevers, chills, nausea, vomiting, or diarrhea. Patient's wound bed currently the patient did have an appointment with Dr. Megan Salon at infectious disease last week. With that being said Dr. Megan Salon the patient states was still somewhat hesitant about put him on any IV antibiotics he wanted [place] to repeat cultures today and then see where things go going forward. He does look like Dr. Megan Salon because of some improvement the patient did have with the Levaquin wanted [place] to see about repeating cultures. If it indeed grows the Pseudomonas again then he recommended a possibility of considering a PICC line placement and IV antibiotic therapy. He plans to see the patient back in 1 to 2 weeks. 08/02/2019 upon evaluation today patient appears to be doing poorly with regard to his left lower extremity. We did get the results of his culture back it shows that he is still showing evidence of Pseudomonas which is consistent with the purulent/blue-green drainage that he has currently. Subsequently the culture also shows that he now is showing resistance to the oral fluoroquinolones which is unfortunate as that was really the only thing to treat the infection prior. I do believe that he is looking like this is going require IV antibiotic therapy to get this under control. Fortunately  there is no signs of systemic infection at this time which is good news. The patient does see Dr. Megan Salon tomorrow. 08/09/2019 upon evaluation today patient appears to be doing better with regard to his left lower extremity ulcer in regard to the overall appearance. He is currently on IV antibiotic therapy. As ordered by Dr. Megan Salon. Currently the patient is on ceftazidime which she is going to take for the next 2 weeks and then follow-up for 4 to 5-week appointment with Dr. Megan Salon. The patient started this this past Friday symptoms have not for a total of 3 days currently in full. 08/16/2019 upon evaluation today patient's wound actually does show muscle in the base of the wound but in general does appear to be much better as far as the overall evidence of infection is concerned. In fact I feel like this is for the most part cleared up he still on the IV antibiotics he has not completed the full course yet but I think he is doing much better which is excellent news. 08/23/2019 upon evaluation today patient appears to be doing about the same with regard to his wound at this point. He tells me that he still has pain unfortunately. Fortunately there is no evidence of systemic infection at this time which is great news. There is significant muscle protrusion. 09/13/19 upon evaluation today patient appears to be doing about the same in regard to his leg unfortunately. He still has a lot of drainage coming from the ulceration there is still muscle exposed. With that being said the patient's last wound culture still showed an intermediate finding with regard to the Pseudomonas he still having the bluish/green drainage as well. Overall I do not know that the wound has completely cleared of infection at this point. Fortunately there is no signs of active infection systemically at this point which is good news. 09/20/2019 upon evaluation today patient's wound actually appears to be doing about the same based on  what I am seeing currently. I do not see any signs of systemic infection he still does have evidence of some local infection and drainage. He did see Dr. Megan Salon last  week and Dr. Megan Salon states that he probably does need a different IV antibiotic although he does not want to put him on this until the patient begins the Remicade infusion which is actually scheduled for about 10 days out from today on 13 May. Following that time Dr. Megan Salon is good to see him back and then will evaluate the feasibility of starting him on the IV antibiotic therapy once again at that point. I do not disagree with this plan I do believe as Dr. Megan Salon stated in his note that I reviewed today that the patient's issue is multifactorial with the pyoderma being 1 aspect of this that were hoping the Remicade will be helpful for her. In the meantime I think the gentamicin is, helping to keep things under decent okay control in regard to the ulcer. 09/27/2019 upon evaluation today patient appears to be doing about the same with regard to his wound still there is a lot of muscle exposure though he does have some hyper granulation tissue noted around the edge and actually some granulation tissue starting to form over the muscle which is actually good news. Fortunately there is no evidence of active infection which is also good news. His pain is less at this point. 5/21; this is a patient I have not seen in a long time. 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 He has a large wound on the left lateral leg with protruding muscle. He comes in the clinic today showing the same area on his left medial ankle. He says there is been a spot there for some time although we have not previously defined this. Today he has a clearly defined area with slight amount of skin breakdown surrounded by raised areas with a purplish hue in color. This is not painful he says it is irritated. This looks distinctly  like I might imagine pyoderma starting 10/25/2019 upon evaluation today patient's wound actually appears to be making some progress. He still has muscle protruding from the lateral portion of his left leg but fortunately the new area that they were concerned about at his last visit does not appear to have opened at this point. He is currently on Remicade infusions and seems to be doing better in my opinion in fact the wound itself seems to be overall much better. The purplish discoloration that he did have seems to have resolved and I think that is a good sign that hopefully the Remicade is doing its job. He does Brian, Devontaye ([id number]) have some biofilm noted over the surface of the wound. 11/01/2019 on evaluation today patient's wound actually appears to be doing excellent at this time. Fortunately there is no evidence of active infection and overall I feel like he is making great progress. The Remicade seems to be due excellent job in my opinion. 11/08/19 evaluation today vision actually appears to be doing quite well with regard to his weight ulcer. He's been tolerating dressing changes without complication. Fortunately there is no evidence of infection. No fevers, chills, nausea, or vomiting noted at this time. Overall states that is having more itching than pain which is actually a good sign in my opinion. 12/13/2019 upon evaluation today patient appears to be doing well today with regard to his wound. He has been tolerating the dressing changes without complication. Fortunately there is no sign of active infection at this time. No fevers, chills, nausea, vomiting, or diarrhea. Overall I feel like the infusion therapy has been very beneficial  for him. 01/06/2020 on evaluation today patient appears to be doing well with regard to his wound. This is measuring smaller and actually looks to be doing better. Fortunately there is no signs of active infection at this point. No fevers, chills, nausea,  vomiting, or diarrhea. With that being said he does still have the blue-green drainage but this does not seem to be causing any significant issues currently. He has been using the gentamicin that does seem to be keeping things under decent control at this point. He goes later this morning for his next infusion therapy for the pyoderma which seems to also be very beneficial. 02/07/2020 on evaluation today patient appears to be doing about the same in regard to his wounds currently. Fortunately there is no signs of active infection systemically he does still have evidence of local infection still using gentamicin. He also is showing some signs of improvement albeit slowly I do feel like we are making some progress here. 02/21/2020 upon evaluation today patient appears to be making some signs of improvement the wound is measuring a little bit smaller which is great news and overall I am very pleased with where he stands currently. He is going to be having infusion therapy treatment on the 15th of this month. Fortunately there is no signs of active infection at this time. 03/13/2020 I do believe patient's wound is actually showing some signs of improvement here which is great news. He has continue with the infusion therapy through rheumatology/dermatology at [name]. That does seem to be beneficial. I still think he gets as much benefit from this as he did from the prednisone initially but nonetheless obviously this is less harsh on his body that the prednisone as far as they are concerned. 03/31/2020 on evaluation today patient's wound actually showing signs of some pretty good improvement in regard to the overall appearance of the wound bed. There is still muscle exposed though he does have some epithelial growth around the edges of the wound. Fortunately there is no signs of active infection at this time. No fevers, chills, nausea, vomiting, or diarrhea. 04/24/2020 upon evaluation today patient appears to be  doing about the same in regard to his leg ulcer. He has been tolerating the dressing changes without complication. Fortunately there is no signs of active infection at this time. No fevers, chills, nausea, vomiting, or diarrhea. With that being said he still has a lot of irritation from the bandaging around the edges of the wound. We did discuss today the possibility of a referral to plastic surgery. 05/22/2020 on evaluation today patient appears to be doing well with regard to his wounds all things considered. He has not been able to get the Chantix apparently there is a recall nurse that I was unaware of put out by [employer] involuntarily. Nonetheless for now I am and I have to do some research into what may be the best option for him to help with quitting in regard to smoking and we discussed that today. 06/26/2020 upon evaluation today patient appears to be doing well with regard to his wound from the standpoint of infection I do not see any signs of infection at this point. With that being said unfortunately he is still continuing to have issues with muscle exposure and again he is not having a whole lot of new skin growth unfortunately. There does not appear to be any signs of active infection at this time. No fevers, chills, nausea, vomiting, or diarrhea. 07/10/2020 upon evaluation today  patient appears to be doing a little bit more poorly currently compared to where he was previous. I am concerned currently about an active infection that may be getting worse especially in light of the increased size and tenderness of the wound bed. No fevers, chills, nausea, vomiting, or diarrhea. 07/24/2020 upon evaluation today patient appears to be doing poorly in regard to his leg ulcer. He has been tolerating the dressing changes without complication but unfortunately is having a lot of discomfort. 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  Subsequently I think he is going require possibly IV antibiotics to get this under control. I am very concerned about the severity of his infection and the amount of discomfort he is having. 07/31/2020 upon evaluation today patient appears to be doing about the same in regard to his leg wound. He did see Dr. Megan Salon and Dr. Megan Salon is actually going to start him on IV antibiotics. He goes for the PICC line tomorrow. With that being said there do not have that run for 2 weeks and then see how things are doing and depending on how he is progressing they may extend that a little longer. Nonetheless I am glad this is getting ready to be in place and definitely feel it may help the patient. In the meantime is been using mainly triamcinolone to the wound bed has an anti-inflammatory. 08/07/2020 on evaluation today patient appears to be doing well with regard to his wound compared even last week. In the interim he has gotten the PICC line placed and overall this seems to be doing excellent. There does not appear to be any evidence of infection which is great news systemically although locally of course has had the infection this appears to be improving with the use of the antibiotics. 08/14/2020 upon evaluation today patient's wound actually showing signs of excellent improvement. Overall the irritation has significantly improved the drainage is back down to more of a normal level and his pain is really pretty much nonexistent compared to what it was. Obviously I think that this is significantly improved secondary to the IV antibiotic therapy which has made all the difference in the world. Again he had a resistant form of Pseudomonas for which oral antibiotics just was not cutting it. Nonetheless I do think that still we need to consider the possibility of a surgical closure for this wound is been open so long and to be honest with muscle exposed I think this can be very hard to get this to close outside of  this although definitely were still working to try to do what we can in that regard. 08/21/2020 upon evaluation today patient appears to be doing very well with regard to his wounds on the left lateral lower extremity/calf area. Fortunately there does not appear to be signs of active infection which is great news and overall very pleased with where things stand today. He is actually wrapping up his treatment with IV antibiotics tomorrow. After that we will see where things go from there. 08/28/2020 upon evaluation today patient appears to be doing decently well with regard to his leg ulcer. There does not appear to be any signs of Simkin, Josten ([id number]) active infection which is great news and overall very pleased with where things stand today. No fevers, chills, nausea, vomiting, or diarrhea. 09/18/2020 upon evaluation today patient appears to be doing well with regard to his infection which I feel like is better. Unfortunately he  is not doing as well with regard to the overall size of the wound which is not nearly as good at this point. I feel like that he may be having an issue here with the pyoderma being somewhat out of control. I think that he may benefit from potentially going back and talking to the dermatologist about what to do from the pyoderma standpoint. I am not certain if the infusions are helping nearly as much is what the prednisone did in the past. 10/02/2020 upon evaluation today patient appears to be doing well with regard to his leg ulcer. He did go to the Psychiatric nurse. Unfortunately they feel like there is a 10% chance that most that he would be able to heal and that the skin graft would take. Obviously this has led him to not be able to go down that path as far as treatment is concerned. Nonetheless he does seem to be doing a little bit better with the prednisone that I gave him last time. I think that he may need to discuss with dermatology the possibility of long-term  prednisone as that seems to be what is most helpful for him to be perfectly honest. I am not sure the Remicade is really doing the job. 10/17/2020 upon evaluation today patient appears to be doing a little better in regard to his wound. In fact the case has been since we did the prednisone on May 2 for him that we have noticed a little bit of improvement each time we have seen a size wise as well as appearance wise as well as pain wise. I think the prednisone has had a greater effect then the infusion therapy has to be perfectly honest. With that being said the patient also feels significantly better compared to what he was previous. All of this is good news but nonetheless I am still concerned about the fact that again we are really not set up to long-term manage him as far as prednisone is concerned. Obviously there are things that you need to be watched I completely understand the risk of prednisone usage as well. 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 With all that being said I do believe that we can give him another round of the prednisone which she is requesting today because of the improvement that he seen since we did that first round. 10/30/2020 upon evaluation today patient's wound actually is showing signs of doing quite well. There does not appear to be any evidence of infection which is great news and overall very pleased with where things stand today. No fevers, chills, nausea, vomiting, or diarrhea. He tells me that the prednisone still has seem to have helped he wonders if we can extend that for just a little bit longer. He did not have the appointment with a dermatologist although he did have an infusion appointment last Friday. That was at [facility]. With that being said he tells me he could not do both that as well as the appointment with the physician on the same day therefore that is can have to be rescheduled. I really want to see if  there is anything they feel like that could be done differently to try to help this out as I am not really certain that the infusions are helping significantly here. Objective Constitutional Obese and well-hydrated in no acute distress. Vitals Time Taken: 8:43 AM, Height: 71 in, Weight: 338 lbs, BMI: 47.1, Temperature: 98.5  F, Pulse: 109 bpm, Respiratory Rate: 18 breaths/min, Blood Pressure: 126/81 mmHg. Respiratory normal breathing without difficulty. Psychiatric this patient is able to make decisions and demonstrates good insight into disease process. Alert and Oriented x 3. pleasant and cooperative. General Notes: Upon inspection patient's wound bed still shows signs of significant muscle involvement at this point. There does not appear to be any evidence of active infection. He has not gotten any of the compression socks that were recommended for stronger compression from elastic therapy we were considering a dual layer. We did give him that information. With that being said he does want to see about extending the prednisone for a little bit longer I think we could do that but really this will probably be the last prescription that we can do for a while as I do not want to get his system set on this currently. Integumentary (Hair, Skin) Wound #1 status is Open. Original cause of wound was Gradually Appeared. The date acquired was: 11/18/2015. The wound has been in treatment 203 weeks. The wound is located on the Left,Lateral Lower Leg. The wound measures 6.4cm length x 7.9cm width x 0.9cm depth; 39.71cm^2 area and 35.739cm^3 volume. There is Fat Layer (Subcutaneous Tissue) exposed. There is no tunneling or undermining noted. There is a large amount of purulent drainage noted. The wound margin is epibole. There is medium (34-66%) red, pink granulation within the wound bed. There is a medium (34-66%) amount of necrotic tissue within the wound bed including Adherent Slough. 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 Bathing/ Shower/ Hygiene: Clean wound with Normal Saline or wound cleanser. Medications-Please add to medication list.: Other: - Prednisone taper to be sent to pharmacy for 12 day taper, please start and follow instructions The following medication(s) was prescribed: prednisone oral 10 mg tablets,dose pack 48 48 tablets, 12 day tapering dose pack by mouth starting 10/30/2020 WOUND #1: - Lower Leg Wound Laterality: Left, Lateral Cleanser: Soap and Water Discharge Instructions: Gently cleanse wound with antibacterial soap, rinse and pat dry prior to dressing wounds Topical: Triamcinolone Acetonide Cream, 0.1%, 15 (g) tube Discharge Instructions: Apply as directed by provider. Primary Dressing: Foam Dressing, 4x4 (in/in) Secured With: Tubigrip Size F, 4x10 (in/yd) Discharge Instructions: Apply DOUBLE Tubigrip F 3 finger-widths below knee to base of toes to secure dressing and/or for swelling. 1. Would recommend currently that we go ahead and continue with the wound care measures as before and the patient is in agreement with that plan. This includes the use of the triamcinolone and a thin film to the wound bed itself. Following this we will using a border foam dressing. He is using double layer Tubigrip to try to help with edema control or his compression sock though probably a stronger compression will be better. 2. I am also can recommend that we have the patient go ahead and continue with the prednisone for a tapering 12-day Dosepak. I currently gave him a 7-day pack just to take 50 mg for 7  days. At this point will taper that down over the next 12 days to completion. This should hopefully avoid any adrenal issues as far as the prednisone is concerned. We will see patient back for reevaluation in 1  week here in the clinic. If anything worsens or changes patient will contact our office for additional recommendations. Electronic Signature(s) Signed: 10/30/2020 1:18:02 PM By: Worthy Keeler PA-C Entered By: Worthy Keeler on 10/30/2020 13:18:02 [name] ([id number]) -------------------------------------------------------------------------------- SuperBill Details Patient Name: [name] Date of Service: 10/30/2020 Medical Record Number: [id number] Patient Account Number: [id number] Date of Birth/Sex: 1981-03-17 (39 y.o. Male) Treating RN: Donnamarie Poag Primary Care Provider: [name] Other Clinician: Referring Provider: [name] Treating Provider/Extender: [name] Cliche in Treatment: 203 Diagnosis Coding ICD-10 Codes Code Description [phone] Non-pressure chronic ulcer of left calf with fat layer exposed L88 Pyoderma gangrenosum L97.321 Non-pressure chronic ulcer of left ankle limited to breakdown of skin I87.2 Venous insufficiency (chronic) (peripheral) L03.116 Cellulitis of left lower limb F17.208 Nicotine dependence, unspecified, with other nicotine-induced disorders Facility Procedures CPT4 Code: [id number] Description: 02111 - WOUND CARE VISIT-LEV 2 EST PT Modifier: Quantity: 1 Physician Procedures CPT4 Code: 5520802 Description: 23361 - WC PHYS LEVEL 3 - EST PT Modifier: Quantity: 1 CPT4 Code: Description: ICD-10 Diagnosis Description L97.222 Non-pressure chronic ulcer of left calf with fat layer exposed L97.321 Non-pressure chronic ulcer of left ankle limited to breakdown of skin L88 Pyoderma gangrenosum I87.2 Venous insufficiency (chronic)  (peripheral) Modifier: Quantity: Electronic Signature(s) Signed: 10/30/2020 1:18:21 PM By: Worthy Keeler PA-C Previous Signature: 10/30/2020 11:11:07 AM Version By: Donnamarie Poag Entered By: Worthy Keeler on 10/30/2020 13:18:21

## 2020-11-02 NOTE — Progress Notes (Signed)
LENFORD, BEDDOW ([id number]) Visit Report for 10/30/2020 Arrival Information Details Patient Name: [name], [name] Date of Service: 10/30/2020 8:30 AM Medical Record Number: [id number] Patient Account Number: [id number] Date of Birth/Sex: 10-05-1980 (40 y.o. Male) Treating RN: Donnamarie Poag Primary Care Khalid Lacko: [name] Other Clinician: Referring Emma Schupp: [name] Treating Averiana Clouatre/Extender: [name] Cliche in Treatment: 203 Visit Information History Since Last Visit Added or deleted any medications: No Patient Arrived: Ambulatory Had a fall or experienced change in No Arrival Time: 08:40 activities of daily living that may affect Accompanied By: self risk of falls: Transfer Assistance: None Hospitalized since last visit: No Patient Identification Verified: Yes Pain Present Now: No Secondary Verification Process Completed: Yes Patient Requires Transmission-Based Precautions: No Patient Has Alerts: Yes Electronic Signature(s) Signed: 11/02/2020 3:50:18 PM By: Jeanine Luz Entered By: Jeanine Luz on 10/30/2020 08:40:52 Bottoms, [name] ([id number]) -------------------------------------------------------------------------------- Clinic Level of Care Assessment Details Patient Name: [name] Date of Service: 10/30/2020 8:30 AM Medical Record Number: [id number] Patient Account Number: [id number] Date of Birth/Sex: 16-Jan-1981 (40 y.o. Male) Treating RN: Donnamarie Poag Primary Care Eryn Krejci: [name] Other Clinician: Referring Anthany Thornhill: [name] Treating Nailani Full/Extender: [name] Cliche in Treatment: 203 Clinic Level of Care Assessment Items TOOL 4 Quantity Score []  - Use when only an EandM is performed on FOLLOW-UP visit 0 ASSESSMENTS - Nursing Assessment / Reassessment []  - Reassessment of Co-morbidities (includes updates in patient status) 0 []  - 0 Reassessment of Adherence to Treatment Plan ASSESSMENTS - Wound and Skin Assessment /  Reassessment []  - Simple Wound Assessment / Reassessment - one wound 0 []  - 0 Complex Wound Assessment / Reassessment - multiple wounds []  - 0 Dermatologic / Skin Assessment (not related to wound area) ASSESSMENTS - Focused Assessment []  - Circumferential Edema Measurements - multi extremities 0 []  - 0 Nutritional Assessment / Counseling / Intervention []  - 0 Lower Extremity Assessment (monofilament, tuning fork, pulses) []  - 0 Peripheral Arterial Disease Assessment (using hand held doppler) ASSESSMENTS - Ostomy and/or Continence Assessment and Care []  - Incontinence Assessment and Management 0 []  - 0 Ostomy Care Assessment and Management (repouching, etc.) PROCESS - Coordination of Care X - Simple Patient / Family Education for ongoing care 1 15 []  - 0 Complex (extensive) Patient / Family Education for ongoing care []  - 0 Staff obtains Programmer, systems, Records, Test Results / Process Orders []  - 0 Staff telephones HHA, Nursing Homes / Clarify orders / etc []  - 0 Routine Transfer to another Facility (non-emergent condition) []  - 0 Routine Hospital Admission (non-emergent condition) []  - 0 New Admissions / Biomedical engineer / Ordering NPWT, Apligraf, etc. []  - 0 Emergency Hospital Admission (emergent condition) X- 1 10 Simple Discharge Coordination []  - 0 Complex (extensive) Discharge Coordination PROCESS - Special Needs []  - Pediatric / Minor Patient Management 0 []  - 0 Isolation Patient Management []  - 0 Hearing / Language / Visual special needs []  - 0 Assessment of Community assistance (transportation, D/C planning, etc.) []  - 0 Additional assistance / Altered mentation []  - 0 Support Surface(s) Assessment (bed, cushion, seat, etc.) INTERVENTIONS - Wound Cleansing / Measurement Jamison, Jenna ([id number]) X- 1 5 Simple Wound Cleansing - one wound []  - 0 Complex Wound Cleansing - multiple wounds []  - 0 Wound Imaging (photographs - any number of wounds) []  -  0 Wound Tracing (instead of photographs) []  - 0 Simple Wound Measurement - one wound []  - 0 Complex Wound Measurement - multiple wounds INTERVENTIONS - Wound Dressings X - Small Wound Dressing one or  multiple wounds 1 10 []  - 0 Medium Wound Dressing one or multiple wounds []  - 0 Large Wound Dressing one or multiple wounds []  - 0 Application of Medications - topical []  - 0 Application of Medications - injection INTERVENTIONS - Miscellaneous []  - External ear exam 0 []  - 0 Specimen Collection (cultures, biopsies, blood, body fluids, etc.) []  - 0 Specimen(s) / Culture(s) sent or taken to Lab for analysis []  - 0 Patient Transfer (multiple staff / Civil Service fast streamer / Similar devices) []  - 0 Simple Staple / Suture removal (25 or less) []  - 0 Complex Staple / Suture removal (26 or more) []  - 0 Hypo / Hyperglycemic Management (close monitor of Blood Glucose) []  - 0 Ankle / Brachial Index (ABI) - do not check if billed separately X- 1 5 Vital Signs Has the patient been seen at the hospital within the last three years: Yes Total Score: 45 Level Of Care: New/Established - Level 2 Electronic Signature(s) Signed: 10/30/2020 11:11:07 AM By: Donnamarie Poag Entered By: Donnamarie Poag on 10/30/2020 09:23:03 [name] ([id number]) -------------------------------------------------------------------------------- Encounter Discharge Information Details Patient Name: [name] Date of Service: 10/30/2020 8:30 AM Medical Record Number: [id number] Patient Account Number: [id number] Date of Birth/Sex: 1980-06-08 (40 y.o. Male) Treating RN: Donnamarie Poag Primary Care Elsie Sakuma: [name] Other Clinician: Referring Jaeline Whobrey: [name] Treating Gamble Enderle/Extender: [name] Cliche in Treatment: 203 Encounter Discharge Information Items Discharge Condition: Stable Ambulatory Status: Ambulatory Discharge Destination: Home Transportation: Private Auto Accompanied By: self Schedule  Follow-up Appointment: Yes Clinical Summary of Care: Electronic Signature(s) Signed: 11/02/2020 3:50:18 PM By: Jeanine Luz Entered By: Jeanine Luz on 10/30/2020 09:19:01 [name] ([id number]) -------------------------------------------------------------------------------- Lower Extremity Assessment Details Patient Name: [name] Date of Service: 10/30/2020 8:30 AM Medical Record Number: [id number] Patient Account Number: [id number] Date of Birth/Sex: [birth year] (39 y.o. Male) Treating RN: Donnamarie Poag Primary Care Tatsuya Okray: [name] Other Clinician: Referring Aasiya Creasey: [name] Treating Thersea Manfredonia/Extender: [name] Cliche in Treatment: 203 Edema Assessment Assessed: [Left: Yes] [Right: No] Edema: [Left: Ye] [Right: s] Calf Left: Right: Point of Measurement: 33 cm From Medial Instep 49 cm Ankle Left: Right: Point of Measurement: 12 cm From Medial Instep 30.3 cm Vascular Assessment Pulses: Dorsalis Pedis Palpable: [Left:Yes] Electronic Signature(s) Signed: 10/30/2020 11:11:07 AM By: Donnamarie Poag Signed: 11/02/2020 3:50:18 PM By: Jeanine Luz Entered By: Jeanine Luz on 10/30/2020 08:53:06 Schweitzer, [name] ([id number]) -------------------------------------------------------------------------------- Multi Wound Chart Details Patient Name: [name] Date of Service: 10/30/2020 8:30 AM Medical Record Number: [id number] Patient Account Number: [id number] Date of Birth/Sex: 28-Mar-1981 (40 y.o. Male) Treating RN: Donnamarie Poag Primary Care Waneda Klammer: [name] Other Clinician: Referring Hayden Mabin: [name] Treating Sehar Sedano/Extender: [name] Cliche in Treatment: 203 Vital Signs Height(in): 71 Pulse(bpm): 109 Weight(lbs): 338 Blood Pressure(mmHg): 126/81 Body Mass Index(BMI): 47 Temperature(F): 98.5 Respiratory Rate(breaths/min): 18 Photos: [N/A:N/A] Wound Location: Left, Lateral Lower Leg N/A N/A Wounding Event: Gradually  Appeared N/A N/A Primary Etiology: Pyoderma N/A N/A Comorbid History: Sleep Apnea, Hypertension, Colitis N/A N/A Date Acquired: 11/18/2015 N/A N/A Weeks of Treatment: 203 N/A N/A Wound Status: Open N/A N/A Measurements L x W x D (cm) 6.4x7.9x0.9 N/A N/A Area (cm) : 39.71 N/A N/A Volume (cm) : 35.739 N/A N/A % Reduction in Area: -708.90% N/A N/A % Reduction in Volume: -810.10% N/A N/A Classification: Full Thickness With Exposed N/A N/A Support Structures Exudate Amount: Large N/A N/A Exudate Type: Purulent N/A N/A Exudate Color: yellow, brown, green N/A N/A Wound Margin: Epibole N/A N/A Granulation Amount: Medium (34-66%) N/A N/A Granulation  Quality: Red, Pink N/A N/A Necrotic Amount: Medium (34-66%) N/A N/A Exposed Structures: Fat Layer (Subcutaneous Tissue): N/A N/A Yes Fascia: No Tendon: No Muscle: No Joint: No Bone: No Epithelialization: Small (1-33%) N/A N/A Treatment Notes Electronic Signature(s) Signed: 10/30/2020 11:11:07 AM By: Donnamarie Poag Entered By: Donnamarie Poag on 10/30/2020 09:02:05 [name] ([id number]) -------------------------------------------------------------------------------- [place] Details Patient Name: [name] Date of Service: 10/30/2020 8:30 AM Medical Record Number: [id number] Patient Account Number: [id number] Date of Birth/Sex: 05-20-1981 (40 y.o. Male) Treating RN: Donnamarie Poag Primary Care Nylah Butkus: [name] Other Clinician: Referring Trixie Maclaren: [name] Treating Mickey Esguerra/Extender: [name] Cliche in Treatment: [place] reviewed with physician Active Inactive Electronic Signature(s) Signed: 10/30/2020 11:11:07 AM By: Donnamarie Poag Entered By: Donnamarie Poag on 10/30/2020 09:01:55 [name] ([id number]) -------------------------------------------------------------------------------- Pain Assessment Details Patient Name: [name] Date of Service: 10/30/2020 8:30  AM Medical Record Number: [id number] Patient Account Number: [id number] Date of Birth/Sex: [birth year] (39 y.o. Male) Treating RN: Donnamarie Poag Primary Care Faun Mcqueen: [name] Other Clinician: Referring Nicole Hafley: [name] Treating Clydette Privitera/Extender: [name] Cliche in Treatment: 203 Active Problems Location of Pain Severity and Description of Pain Patient Has Paino No Site Locations Rate the pain. Current Pain Level: 0 Pain Management and Medication Current Pain Management: Electronic Signature(s) Signed: 10/30/2020 11:11:07 AM By: Donnamarie Poag Signed: 11/02/2020 3:50:18 PM By: Jeanine Luz Entered By: Jeanine Luz on 10/30/2020 08:49:39 [place], [name] ([id number]) -------------------------------------------------------------------------------- Patient/Caregiver Education Details Patient Name: [name] Date of Service: 10/30/2020 8:30 AM Medical Record Number: [id number] Patient Account Number: [id number] Date of Birth/Gender: [birth year] (40 y.o. Male) Treating RN: Donnamarie Poag Primary Care Physician: [name] Other Clinician: Referring Physician: [name] Treating Physician/Extender: [name] Cliche in Treatment: [phone] Education Assessment Education Provided To: Patient Education Topics Provided Smoking and Wound Healing: Wound/Skin Impairment: Electronic Signature(s) Signed: 10/30/2020 11:11:07 AM By: Donnamarie Poag Entered By: Donnamarie Poag on 10/30/2020 09:02:22 [name] ([id number]) -------------------------------------------------------------------------------- Wound Assessment Details Patient Name: [name] Date of Service: 10/30/2020 8:30 AM Medical Record Number: [id number] Patient Account Number: [id number] Date of Birth/Sex: May 04, 1981 (40 y.o. Male) Treating RN: Donnamarie Poag Primary Care Nikitha Mode: [name] Other Clinician: Referring Broadus Costilla: [name] Treating Barlow Harrison/Extender: [name] Cliche in Treatment:  203 Wound Status Wound Number: 1 Primary Etiology: Pyoderma Wound Location: Left, Lateral Lower Leg Wound Status: Open Wounding Event: Gradually Appeared Comorbid History: Sleep Apnea, Hypertension, Colitis Date Acquired: 11/18/2015 Weeks Of Treatment: 203 Clustered Wound: No Photos Wound Measurements Length: (cm) 6.4 Width: (cm) 7.9 Depth: (cm) 0.9 Area: (cm) 39.71 Volume: (cm) 35.739 % Reduction in Area: -708.9% % Reduction in Volume: -810.1% Epithelialization: Small (1-33%) Tunneling: No Undermining: No Wound Description Classification: Full Thickness With Exposed Support Structures Wound Margin: Epibole Exudate Amount: Large Exudate Type: Purulent Exudate Color: yellow, brown, green Foul Odor After Cleansing: No Slough/Fibrino Yes Wound Bed Granulation Amount: Medium (34-66%) Exposed Structure Granulation Quality: Red, Pink Fascia Exposed: No Necrotic Amount: Medium (34-66%) Fat Layer (Subcutaneous Tissue) Exposed: Yes Necrotic Quality: Adherent Slough Tendon Exposed: No Muscle Exposed: No Joint Exposed: No Bone Exposed: No Treatment Notes Wound #1 (Lower Leg) Wound Laterality: Left, Lateral Cleanser Soap and Water Discharge Instruction: Gently cleanse wound with antibacterial soap, rinse and pat dry prior to dressing wounds Peri-Wound Care Velasques, Benjermin ([id number]) Topical Triamcinolone Acetonide Cream, 0.1%, 15 (g) tube Discharge Instruction: Apply as directed by Garold Sheeler. Primary Dressing Foam Dressing, 4x4 (in/in) Secondary Dressing Secured With Tubigrip Size F, 4x10 (in/yd) Discharge Instruction: Apply Tubigrip F 3 finger-widths  below knee to base of toes to secure dressing and/or for swelling. Compression Wrap Compression Stockings Add-Ons Electronic Signature(s) Signed: 10/30/2020 11:11:07 AM By: Donnamarie Poag Signed: 11/02/2020 3:50:18 PM By: Jeanine Luz Entered By: Jeanine Luz on 10/30/2020 [place], [name]  ([id number]) -------------------------------------------------------------------------------- Vitals Details Patient Name: [name] Date of Service: 10/30/2020 8:30 AM Medical Record Number: [id number] Patient Account Number: [id number] Date of Birth/Sex: 04-03-1981 (40 y.o. Male) Treating RN: Donnamarie Poag Primary Care Kailey Esquilin: [name] Other Clinician: Referring Yuliza Cara: [name] Treating Normon Pettijohn/Extender: [name] Cliche in Treatment: 203 Vital Signs Time Taken: 08:43 Temperature (F): 98.5 Height (in): 71 Pulse (bpm): 109 Weight (lbs): 338 Respiratory Rate (breaths/min): 18 Body Mass Index (BMI): 47.1 Blood Pressure (mmHg): 126/81 Reference Range: 80 - 120 mg / dl Electronic Signature(s) Signed: 11/02/2020 3:50:18 PM By: Jeanine Luz Entered By: Jeanine Luz on 10/30/2020 08:49:31

## 2020-11-13 ENCOUNTER — Encounter: Payer: 59 | Admitting: Physician Assistant

## 2020-11-13 ENCOUNTER — Other Ambulatory Visit: Payer: Self-pay

## 2020-11-13 DIAGNOSIS — L97222 Non-pressure chronic ulcer of left calf with fat layer exposed: Secondary | ICD-10-CM | POA: Diagnosis not present

## 2020-11-13 NOTE — Progress Notes (Addendum)
[name], [name] ([id number]) Visit Report for 11/13/2020 Chief Complaint Document Details Patient Name: [name], [name] Date of Service: 11/13/2020 8:00 AM Medical Record Number: [id number] Patient Account Number: [id number] Date of Birth/Sex: 1980/06/17 (40 y.o. 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 M) Treating RN: Donnamarie Poag Primary Care Provider: [name] Other Clinician: Referring Provider: [name] Treating Provider/Extender: [name] Cliche in Treatment: 205 History of Present Illness HPI Description: 12/04/16; 40 year old man who comes into the clinic today for review of a wound on the posterior left calf. He tells me that is been there for about a year. He is not a diabetic he does smoke half a pack per day. He was seen in the ER on 11/20/16 felt to have cellulitis around the wound and was given clindamycin. An x-ray did not show osteomyelitis. The patient initially tells me that he has a milk allergy that sets off a pruritic itching rash on his lower legs which she scratches incessantly and he thinks that's what may have set up the wound. He has been using various topical antibiotics and ointments without any effect. He works in a [employer] and is on his feet all day. He does not have a prior history  of wounds however he does have the rash on both lower legs the right arm and the ventral aspect of his left arm. These are excoriations and clearly have had scratching however there are of macular looking areas on both legs including a substantial larger area on the right leg. This does not have an underlying open area. There is no blistering. The patient tells me that 2 years ago in [place] in response to the rash on his legs he saw a dermatologist who told him he had a condition which may be pyoderma gangrenosum although I may be putting words into his mouth. He seemed to recognize this. 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 He is not in any treatment for this. He's had no recent travel 12/11/16; the patient arrives today with his wound and roughly the same condition we've been using silver alginate this is a deep punched out wound with some surrounding erythema but no tenderness. Biopsy I did did not show confirmed pyoderma gangrenosum suggested nonspecific inflammation and vasculitis but does not provide an actual description of what was seen by the pathologist. I'm really not able to understand this We have also received information from the patient's dermatologist in [place] notes from April 2016. This was a doctor Agarwal-antal. 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 He was prescribed topical steroid high potency under occlusion which helped but at this point the patient did not have a deep punched out wound. 12/18/16; the patient's wound is larger in terms of surface area however this surface looks better and there is less depth. The surrounding erythema also is better. The patient states that the wrap we put on came off 2 days ago when he has been using his compression stockings.  He we are in the process of getting a dermatology consult. 12/26/16 on evaluation today patient's left lower extremity wound shows evidence of infection with  surrounding erythema noted. He has been tolerating the dressing changes but states that he has noted more discomfort. There is a larger area of erythema surrounding the wound. No fevers, chills, nausea, or vomiting noted at this time. With that being said the wound still does have slough covering the surface. He is not allergic to any medication that he is aware of at this point. In regard to his right lower extremity he had several regions that are erythematous and pruritic he wonders if there's anything we can do to help that. 01/02/17 I reviewed patient's wound culture which was obtained his visit last week. He was placed on doxycycline at that point. Unfortunately that does not appear to be an antibiotic that would likely help with the situation however the pseudomonas noted on culture is sensitive to Cipro. Also unfortunately patient's wound seems to have a large compared to last week's evaluation. Not severely so but there are definitely increased measurements in general. He is continuing to have discomfort as well he writes this to be a seven out of 10. In fact he would prefer me not to perform any debridement today due to the fact that he is having discomfort and considering he has an active infection on the little reluctant to do so anyway. No fevers, chills, nausea, or vomiting noted at this time. 01/08/17; patient seems dermatology on September 5. I suspect dermatology will want the slides from the biopsy I did sent to their pathologist. I'm not sure if there is a way we can expedite that. In any case the culture I did before I left on vacation 3 weeks ago showed Pseudomonas he was given 10 days of Cipro and per her description of her intake nurses is actually somewhat better this week although the wound is quite a bit bigger than I remember the last time I saw this. He still has 3 more days of Cipro 01/21/17; dermatology appointment tomorrow. He has completed the ciprofloxacin for Pseudomonas.  Surface of the wound looks better however he is had some deterioration in the lesions on his right leg. Meantime the left lateral leg wound we will continue with sample 01/29/17; patient had his dermatology appointment but I can't yet see that note. He is completed his antibiotics. The wound is more superficial but considerably larger in circumferential area than when he came in. This is in his left lateral calf. He also has swollen erythematous areas with superficial wounds on the right leg and small papular areas on both arms. There apparently areas in her his upper thighs and buttocks I did not look at those. Dermatology biopsied the right leg. Hopefully will have their input next week. 02/05/17; patient went back to see his dermatologist who told him that he had a "scratching problem" as well as staph. He is now on a 30 day course of doxycycline and I believe she gave him triamcinolone cream to the right leg areas to help with the itching [not exactly sure but probably triamcinolone]. She apparently looked at the left lateral leg wound although this was not rebiopsied and I think felt to be ultimately part of the same pathogenesis. He is using sample border foam and changing nevus himself. He now has a new open area on the right posterior leg which was his biopsy site I don't have any of the dermatology notes  02/12/17; we put the patient in compression last week with SANTYL to the wound on the left leg and the biopsy. Edema is much better and the depth of the wound is now at level of skin. Area is still the same oBiopsy site on the right lateral leg we've also been using santyl with a border foam dressing and he is changing this himself. 02/19/17; Using silver alginate started last week to both the substantial left leg wound and the biopsy site on the right wound. He is tolerating compression well. Has a an appointment with his primary M.D. tomorrow wondering about diuretics although I'm wondering if  the edema problem is actually lymphedema 02/26/17; the patient has been to see his primary doctor Dr. Jerrel Ivory at [facility] our primary care. She started him on Lasix 20 mg and this seems to have helped with the edema. However we are not making substantial change with the left lateral calf wound and inflammation. The biopsy site on the right leg also looks stable but not really all that different. 03/12/17; the patient has been to see vein and vascular Dr. Lucky Cowboy. He has had venous reflux studies I have not reviewed these. I did get a call from his dermatology office. They felt that he might have pathergy based on their biopsy on his right leg which led them to look at the slides of [name], [name] ([id number]) the biopsy I did on the left leg and they wonder whether this represents pyoderma gangrenosum which was the original supposition in a man with ulcerative colitis albeit inactive for many years. They therefore recommended clobetasol and tetracycline i.e. 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 03/26/17; apparently the patient just had reflux studies not an appointment with Dr. dew. She arrives in clinic today having applied clobetasol for 2-3 weeks. He notes over the last 2-3 days excessive drainage having to change the dressing 3-4 times a day and also expanding erythema. 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 Lillia Carmel at [facility] dermatology locally who kindly saw him at my request. 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 My concern was the degree of erythema medially extending from his left leg wound which was either inflammation from pyoderma or cellulitis. I  put him on Augmentin however culture of the wound showed Pseudomonas which is quinolone sensitive. I really don't believe he has cellulitis however in view of everything I will continue and give him a course of Cipro. He is also on doxycycline as an immune modulator for the pyoderma. In addition to his original wound on the left lateral leg with surrounding erythema he has a wound on the right posterior calf which was an original biopsy site done by dermatology. 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 Saw Dr. Lillia Carmel yesterday. He has been using topical antibiotics to both wound areas his original wound on the left and the biopsies/pathergy area on the right. There is definitely some improvement in the inflammation around the wound on the right although the patient states he has increasing sensitivity of the wounds. He is on prednisone 60 and doxycycline 1 as prescribed by Dr. Lillia Carmel. He is covering the topical antibiotic with gauze  and putting this in his own compression stocks and changing this daily. He states that Dr. Lottie Rater did a culture of the left leg wound yesterday 05/07/17; pyoderma gangrenosum. The patient saw Dr. Lillia Carmel yesterday and has a follow-up with her in one month. He is still using topical antibiotics to both wounds although he can't recall exactly what type. He is still on prednisone 60 mg. Dr. Lillia Carmel stated that the doxycycline could stop if we were in agreement. 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 The right medial leg was induced by biopsy/pathergy. The area on the right is essentially healed. 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 The right medial leg has closed and remains closed. He is still on  prednisone 60. oHe tells me he missed his last dermatology appointment with Dr. Lillia Carmel but will make another appointment. He reports that her blood sugar at a recent screen in [place] was high 200's. He was 180 today. 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 I think he is going to require still much longer prednisone perhaps another 3 months before attempting to taper. In the meantime his wound is a lot better. Smaller. He is cleaning this off daily and applying topical antibiotics. When he was last in the clinic I thought about changing to [facility] and actually put in a couple of calls to dermatology although probably not during their business hours. 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 He continues on oral prednisone. He has been using triple antibiotic ointment. There is surface debris and we will transition to [facility] and have him return in 2 weeks. He has lost 30 pounds since his last appointment with lifestyle modification. He may benefit from topical steroid cream for treatment this can be considered at a later date. 08/22/17 on evaluation today patient appears to actually be doing rather well in regard to his left lateral lower extremity ulcer. He has actually been managed by Dr. Dellia Nims most recently. Patient is currently on oral steroids at this time. This seems to have been of benefit for him. Nonetheless his last visit was actually with Leah on 08/06/17. Currently he is not utilizing any topical steroid creams although this could be of benefit as well. No fevers, chills, nausea, or vomiting noted at this time. 09/05/17 on evaluation today patient appears to be doing better in regard to his left lateral lower extremity ulcer. He has been tolerating the dressing changes without complication. He is using Santyl with good effect. Overall I'm very  pleased with how things are standing at this point. Patient likewise is happy that this is doing better. 09/19/17 on evaluation today patient actually appears to be doing rather well in regard to his left lateral lower extremity ulcer. 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 He's not having any significant pain. 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 He has been tolerating the Santyl without complication and in general I feel like he's making good progress. 10/17/17 on evaluation today patient  appears to be doing very well in regard to his left lateral lower surety ulcer. He has been tolerating the dressing changes without complication. There does not appear to be any evidence of infection he's alternating the Santyl and the triple antibiotic ointment every other day this seems to be doing well for him. 11/03/17 on evaluation today patient appears to be doing very well in regard to his left lateral lower extremity ulcer. He is been tolerating the dressing changes without complication which is good news. Fortunately there does not appear to be any evidence of infection which is also great news. Overall is doing excellent they are starting to taper down on the prednisone is down to 40 mg at this point it also started topical clobetasol for him. 11/17/17 on evaluation today patient appears to be doing well in regard to his left lateral lower surety ulcer. He's been tolerating the dressing changes without complication. He does note that he is having no pain, no excessive drainage or discharge, and overall he feels like things are going about how he would expect and hope they would. Overall he seems to have no evidence of infection at this time in my opinion which is good news. 12/04/17-He is seen in follow-up evaluation  for right lateral lower extremity ulcer. He has been applying topical steroid cream. Today's measurement show slight increase in size. Over the next 2 weeks we will transition to every other day Santyl and steroid cream. He has been encouraged to monitor for changes and notify clinic with any concerns 12/15/17 on evaluation today patient's left lateral motion the ulcer and fortunately is doing worse again at this point. This just since last week to this week has close to doubled in size according to the patient. I did not seeing last week's I do not have a visual to compare this to in our system was also down so we do not have all the charts and at this point. Nonetheless it does have me somewhat concerned in regard to the fact that again he was worried enough about it he has contact the dermatology that placed them back on the full strength, 50 mg a day of the prednisone that he was taken previous. He continues to alternate using clobetasol along with Santyl at this point. He is obviously somewhat frustrated. 12/22/17 on evaluation today patient appears to be doing a little worse compared to last evaluation. Unfortunately the wound is a little deeper and slightly larger than the last week's evaluation. With that being said he has made some progress in regard to the irritation surrounding at this time unfortunately despite that progress that's been made he still has a significant issue going on here. 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 [name], [name] ([id number]) For that reason I discussed with him today the possibility of perform a wound culture to ensure there's nothing overtly infected. 01/06/18 on evaluation today patient's wound is larger and deeper than previously evaluated. With that being said it did appear that his wound was infected after  my last evaluation with him. Subsequently I did end up prescribing a prescription for Bactrim DS which she has been taking and having no complication with. Fortunately there does not appear to be any evidence of infection at this point in time as far as anything spreading, no want to touch, and overall I feel like  things are showing signs of improvement. 01/13/18 on evaluation today patient appears to be even a little larger and deeper than last time. There still muscle exposed in the base of the wound. Nonetheless he does appear to be less erythematous I do believe inflammation is calming down also believe the infection looks like it's probably resolved at this time based on what I'm seeing. No fevers, chills, nausea, or vomiting noted at this time. 01/30/18 on evaluation today patient actually appears to visually look better for the most part. Unfortunately those visually this looks better he does seem to potentially have what may be an abscess in the muscle that has been noted in the central portion of the wound. This is the first time that I have noted what appears to be fluctuance in the central portion of the muscle. With that being said I'm somewhat more concerned about the fact that this might indicate an abscess formation at this location. I do believe that an ultrasound would be appropriate. This is likely something we need to try to do as soon as possible. He has been switch to mupirocin ointment and he is no longer using the steroid ointment as prescribed by dermatology he sees them again next week he's been decreased from 60 to 40 mg of prednisone. 03/09/18 on evaluation today patient actually appears to be doing a little better compared to last time I saw him. There's not as much erythema surrounding the wound itself. He I did review his most recent infectious disease note which was dated 02/24/18. He saw Dr. Michel Bickers in [place]. 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 Patient is now off of antibiotics and they are continually observing this. There seems to be no change in the past two weeks in my pinion based on what the patient says and what I see today compared to what Dr. Megan Salon likely saw two weeks ago. No fevers, chills, nausea, or vomiting noted at this time. 03/23/18 on evaluation today patient's wound actually appears to be showing signs of improvement which is good news. He is currently still on the Dapsone. He is also working on tapering the prednisone to get off of this and Dr. Lottie Rater is working with him in this regard. Nonetheless overall I feel like the wound is doing well it does appear based on the infectious disease note that I reviewed from Dr. 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 I did also review the results of his ultrasound of left lower extremity which revealed there was a dentist tissue in the base of the wound without an abscess noted. 04/06/18 on evaluation today the patient's left lateral lower extremity ulcer actually appears to be doing fairly well which is excellent news. There does not appear to be any evidence of infection at this time which is also great news. Overall he still does have a significantly large ulceration although little by little he seems to be making progress. He is down to 10 mg a day of the prednisone. 04/20/18 on evaluation today patient actually appears to be doing excellent at this time in regard to his left lower extremity ulcer. He's making signs of good progress unfortunately this is taking much longer than we would really like to see but nonetheless he is  making progress. Fortunately there does not appear to be any evidence of infection at this time. No fevers, chills, nausea, or vomiting noted at this time.  The patient has not been using the Santyl due to the cost he hadn't got in this field yet. He's mainly been using the antibiotic ointment topically. Subsequently he also tells me that he really has not been scrubbing in the shower I think this would be helpful again as I told him it doesn't have to be anything too aggressive to even make it believe just enough to keep it free of some of the loose slough and biofilm on the wound surface. 05/11/18 on evaluation today patient's wound appears to be making slow but sure progress in regard to the left lateral lower extremity ulcer. He is been tolerating the dressing changes without complication. Fortunately there does not appear to be any evidence of infection at this time. He is still just using triple antibiotic ointment along with clobetasol occasionally over the area. He never got the Santyl and really does not seem to intend to in my pinion. 06/01/18 on evaluation today patient appears to be doing a little better in regard to his left lateral lower extremity ulcer. He states that overall he does not feel like he is doing as well with the Dapsone as he did with the prednisone. Nonetheless he sees his dermatologist later today and is gonna talk to them about the possibility of going back on the prednisone. Overall again I believe that the wound would be better if you would utilize Santyl but he really does not seem to be interested in going back to the [place] at this point. He has been using triple antibiotic ointment. 06/15/18 on evaluation today patient's wound actually appears to be doing about the same at this point. Fortunately there is no signs of infection at this time. He has made slight improvements although he continues to not really want to clean the wound bed at this point. He states that he just doesn't mess with it he doesn't want to cause any problems with everything else he has going on. He has been on medication, antibiotics as prescribed  by his dermatologist, for a staff infection of his lower extremities which is really drying out now and looking much better he tells me. Fortunately there is no sign of overall infection. 06/29/18 on evaluation today patient appears to be doing well in regard to his left lateral lower surety ulcer all things considering. Fortunately his staff infection seems to be greatly improved compared to previous. He has no signs of infection and this is drying up quite nicely. He is still the doxycycline for this is no longer on cental, Dapsone, or any of the other medications. His dermatologist has recommended possibility of an infusion but right now he does not want to proceed with that. 07/13/18 on evaluation today patient appears to be doing about the same in regard to his left lateral lower surety ulcer. Fortunately there's no signs of infection at this time which is great news. Unfortunately he still builds up a significant amount of Slough/biofilm of the surface of the wound he still is not really cleaning this as he should be appropriately. Again I'm able to easily with saline and gauze remove the majority of this on the surface which if you would do this at home would likely be a dramatic improvement for him as far as getting the area to improve. Nonetheless overall I still feel like he  is making progress is just very slow. I think Santyl will be of benefit for him as well. Still he has not gotten this as of this point. 07/27/18 on evaluation today patient actually appears to be doing little worse in regards of the erythema around the periwound region of the wound he also tells me that he's been having more drainage currently compared to what he was experiencing last time I saw him. He states not quite as bad as what he had because this was infected previously but nonetheless is still appears to be doing poorly. Fortunately there is no evidence of systemic infection at this point. The patient tells me that  he is not going to be able to afford the Santyl. He is still waiting to hear about the infusion therapy with his dermatologist. Apparently she wants an updated colonoscopy first. 08/10/18 on evaluation today patient appears to be doing better in regard to his left lateral lower extremity ulcer. Fortunately he is showing signs of improvement in this regard he's actually been approved for Remicade infusion's as well although this has not been scheduled as of yet. Fortunately there's no signs of active infection at this time in regard to the wound although he is having some issues with infection of the right lower extremity is been seen as dermatologist for this. Fortunately they are definitely still working with him trying to keep things under control. [name], [name] ([id number]) 09/07/18 on evaluation today patient is actually doing rather well in regard to his left lateral lower extremity ulcer. He notes these actually having some hair grow back on his extremity which is something he has not seen in years. He also tells me that the pain is really not giving them any trouble at this time which is also good news overall she is very pleased with the progress he's using a combination of the mupirocin along with the probate is all mixed. 09/21/18 on evaluation today patient actually appears to be doing fairly well all things considered in regard to his looks from the ulcer. He's been tolerating the dressing changes without complication. Fortunately there's no signs of active infection at this time which is good news he is still on all antibiotics or prevention of the staff infection. He has been on prednisone for time although he states it is gonna contact his dermatologist and see if she put them on a short course due to some irritation that he has going on currently. Fortunately there's no evidence of any overall worsening this is going very slow I think cental would be something that would be helpful for him  although he states that $50 for tube is quite expensive. He therefore is not willing to get that at this point. 10/06/18 on evaluation today patient actually appears to be doing decently well in regard to his left lateral leg ulcer. He's been tolerating the dressing changes without complication. Fortunately there's no signs of active infection at this time. Overall I'm actually rather pleased with the progress he's making although it's slow he doesn't show any signs of infection and he does seem to be making some improvement. I do believe that he may need a switch up and dressings to try to help this to heal more appropriately and quickly. 10/19/18 on evaluation today patient actually appears to be doing better in regard to his left lateral lower extremity ulcer. This is shown signs of having much less Slough buildup at this point due to the fact he has been using  the Santyl. Obviously this is very good news. The overall size of the wound is not dramatically smaller but again the appearance is. 11/02/18 on evaluation today patient actually appears to be doing quite well in regard to his lower [place] ulcer. A lot of the skin around the ulcer is actually somewhat irritating at this point this seems to be more due to the dressing causing irritation from the adhesive that anything else. Fortunately there is no signs of active infection at this time. 11/24/18 on evaluation today patient appears to be doing a little worse in regard to his overall appearance of his lower extremity ulcer. There's more erythema and warmth around the wound unfortunately. He is currently on doxycycline which he has been on for some time. With that being said I'm not sure that seems to be helping with what appears to possibly be an acute cellulitis with regard to his left lower extremity ulcer. No fevers, chills, nausea, or vomiting noted at this time. 12/08/18 on evaluation today patient's wounds actually appears to be doing  significantly better compared to his last evaluation. He has been using Santyl along with alternating tripling about appointment as well as the steroid cream seems to be doing quite well and the wound is showing signs of improvement which is excellent news. Fortunately there's no evidence of infection and in fact his culture came back negative with only normal skin flora noted. 12/21/2018 upon evaluation today patient actually appears to be doing excellent with regard to his ulcer. This is actually the best that I have seen it since have been helping to take care of him. It is both smaller as well as less slough noted on the surface of the wound and seems to be showing signs of good improvement with new skin growing from the edges. He has been using just the triamcinolone he does wonder if he can get a refill of that ointment today. 01/04/2019 upon evaluation today patient actually appears to be doing well with regard to his left lateral lower extremity ulcer. With that being said it does not appear to be that he is doing quite as well as last time as far as progression is concerned. There does not appear to be any signs of infection or significant irritation which is good news. With that being said I do believe that he may benefit from switching to a collagen based dressing based on how clean The wound appears. 01/18/2019 on evaluation today patient actually appears to be doing well with regard to his wound on the left lower extremity. He is not made a lot of progress compared to where we were previous but nonetheless does seem to be doing okay at this time which is good news. There is no signs of active infection which is also good news. My only concern currently is I do wish we can get him into utilizing the collagen dressing his insurance would not pay for the supplies that we ordered although it appears that he may be able to order this through his supply company that he typically utilizes. This is  [employer]. Nonetheless he did try to order it during the office visit today and it appears this did go through. We will see if he can get that it is a different brand but nonetheless he has collagen and I do think will be beneficial. 02/01/2019 on evaluation today patient actually appears to be doing a little worse today in regard to the overall size of his wounds. Fortunately there  is no signs of active infection at this time. That is visually. Nonetheless when this is happened before it was due to infection. For that reason were somewhat concerned about that this time as well. 02/08/2019 on evaluation today patient unfortunately appears to be doing slightly worse with regard to his wound upon evaluation today. Is measuring a little deeper and a little larger unfortunately. I am not really sure exactly what is causing this to enlarge he actually did see his dermatologist she is going to see about initiating Humira for him. Subsequently she also did do steroid injections into the wound itself in the periphery. Nonetheless still nonetheless he seems to be getting a little bit larger he is gone back to just using the steroid cream topically which I think is appropriate. I would say hold off on the collagen for the time being is definitely a good thing to do. Based on the culture results which we finally did get the final result back regarding it shows staph as the bacteria noted again that can be a normal skin bacteria based on the fact however he is having increased drainage and worsening of the wound measurement wise I would go ahead and place him on an antibiotic today I do believe for this. 02/15/2019 on evaluation today patient actually appears to be doing somewhat better in regard to his ulcer. There is no signs of worsening at this time I did review his culture results which showed evidence of Staphylococcus aureus but not MRSA. Again this could just be more related to the normal skin bacteria  although he states the drainage has slowed down quite a bit he may have had a mild infection not just colonization. And was much smaller and then since around10/04/2019 on evaluation today patient appears to be doing unfortunately worse as far as the size of the wound. I really feel like that this is steadily getting larger again it had been doing excellent right at the beginning of September we have seen a steady increase in the area of the wound it is almost 2-1/2 times the size it was on September 1. Obviously this is a bad trend this is not wanting to see. For that reason we went back to using just the topical triamcinolone cream which does seem to help with inflammation. I checked him for bacteria by way of culture and nothing showed positive there. I am considering giving him a short course of a tapering steroid Dosepak today to see if that is can be beneficial for him. The patient is in agreement with giving that a try. 03/08/2019 on evaluation today patient appears to be doing very well in comparison to last evaluation with regard to his lower extremity ulcer. This is showing signs of less inflammation and actually measuring slightly smaller compared to last time every other week over the past month and a half he has been measuring larger larger larger. Nonetheless I do believe that the issue has been inflammation the prednisone does seem to [facility], [name] ([id number]) have been beneficial for him which is good news. No fevers, chills, nausea, vomiting, or diarrhea. 03/22/2019 on evaluation today patient appears to be doing about the same with regard to his leg ulcer. He has been tolerating the dressing changes without complication. With that being said the wound seems to be mostly arrested at its current size but really is not making any progress except for when we prescribed the prednisone. He did show some signs of dropping as far as  the overall size of the wound during that interval week.  Nonetheless this is something he is not on long-term at this point and unfortunately I think he is getting need either this or else the Humira which his dermatologist has discussed try to get approval for. With that being said he will be seeing his dermatologist on the 11th of this month that is November. 04/19/2019 on evaluation today patient appears to be doing really about the same the wound is measuring slightly larger compared to last time I saw him. He has not been into the office since November 2 due to the fact that he unfortunately had Covid as that his entire family. He tells me that it was rough but they did pull-through and he seems to be doing much better. Fortunately there is no signs of active infection at this time. No fevers, chills, nausea, vomiting, or diarrhea. 05/10/2019 on evaluation today patient unfortunately appears to be doing significantly worse as compared to last time I saw him. He does tell me that he has had his first dose of Humira and actually is scheduled to get the next one in the upcoming week. With that being said he tells me also that in the past several days he has been having a lot of issues with green drainage she showed me a picture this is more blue-green in color. He is also been having issues with increased sloughy buildup and the wound does appear to be larger today. Obviously this is not the direction that we want everything to take based on the starting of his Humira. Nonetheless I think this is definitely a result of likely infection and to be honest I think this is probably Pseudomonas causing the infection based on what I am seeing. 05/24/2019 on evaluation today patient unfortunately appears to be doing significantly worse compared to his prior evaluation with me 2 weeks ago. I did review his culture results which showed that he does have Staph aureus as well as Pseudomonas noted on the culture. Nonetheless the Levaquin that I prescribed for him does  not appear to have been appropriate and in fact he tells me he is no longer experiencing the green drainage and discharge that he had at the last visit. Fortunately there is no signs of active infection at this time which is good news although the wound has significantly worsened it in fact is much deeper than it was previous. We have been utilizing up to this point triamcinolone ointment as the prescription topical of choice but at this time I really feel like that the wound is getting need to be packed in order to appropriately manage this due to the deeper nature of the wound. Therefore something along the lines of an alginate dressing may be more appropriate. 05/31/2019 upon inspection today patient's wound actually showed signs of doing poorly at this point. Unfortunately he just does not seem to be making any good progress despite what we have tried. He actually did go ahead and pick up the Cipro and start taking that as he was noticing more green drainage he had previously completed the Levaquin that I prescribed for him as well. Nonetheless he missed his appointment for the seventh last week on Wednesday with the wound care center and [facility] where his dermatologist referred him. Obviously I do think a second opinion would be helpful at this point especially in light of the fact that the patient seems to be doing so poorly despite the fact  that we have tried everything that I really know how at this point. The only thing that ever seems to have helped him in the past is when he was on high doses of continual steroids that did seem to make a difference for him. 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 06/07/2019 upon evaluation today patient unfortunately appears to be doing worse yet again with regard to his wound. In fact I am  starting to question whether or not he may have a fluid pocket in the muscle at this point based on the bulging and the soft appearance to the central portion of the muscle area. There is not anything draining from the muscle itself at this time which is good news but nonetheless the wound is expanding. I am not really seeing any results of the Humira as far as overall wound progression based on what I am seeing at this point. The patient has been referred for second opinion with regard to his wound to the [facility] wound care center by his dermatologist which I definitely am not in opposition to. Unfortunately we tried multiple dressings in the past including collagen, alginate, and at one point even Hydrofera Blue. With that being said he is never really used it for any significant amount of time due to the fact that he often complains of pain associated with these dressings and then will go back to either using the Santyl which she has done intermittently or more frequently the triamcinolone. He is also using his own compression stockings. We have wrapped him in the past but again that was something else that he really was not a big fan of. 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 He seemed to do much better when he was on the steroids although honestly I understand that the steroids are not necessarily the best medication to be on long-term obviously 06/14/2019 on evaluation today patient appears to be doing actually a little bit better with regard to the overall appearance with his leg. Unfortunately he does continue to have issues with what appears to be some fluid underneath the  muscle although he did see the wound specialty center at [facility] last week their main goals were to see about infusion therapy in place of the Humira as they feel like that is not quite strong enough. They also recommended that we continue with the treatment otherwise as we are they felt like that was appropriate and they are okay with him continuing to follow-up here with [place] in that regard. With that being said they are also sending him to the vein specialist there to see about vein stripping and if that would be of benefit for him. Subsequently they also did not really address whether or not an ultrasound of the muscle area to see if there is anything that needs to be addressed here would be appropriate or not. For that reason I discussed this with him last week I think we  may proceed down that road at this point. 06/21/2019 upon evaluation today patient's wound actually appears to be doing slightly better compared to previous evaluations. I do believe that he has made a difference with regard to the progression here with the use of oral steroids. Again in the past has been the only thing that is really calm things down. He does tell me that from [facility] is gotten a good news from there that there are no further vein stripping that is necessary at this point. I do not have that available for review today although the patient did relay this to me. He also did obtain and have the ultrasound of the wound completed which I did sign off on today. It does appear that there is no fluid collection under the muscle this is likely then just edematous tissue in general. That is also good news. Overall I still believe the inflammation is the main issue here. He did inquire about the possibility of a wound VAC again with the muscle protruding like it is I am not really sure whether the wound VAC is necessarily ideal or not. That is something we will have to consider although I do believe he may need compression wrapping to  try to help with edema control which could potentially be of benefit. 06/28/2019 on evaluation today patient appears to be doing slightly better measurement wise although this is not terribly smaller he least seems to be trending towards that direction. With that being said he still seems to have purulent drainage noted in the wound bed at this time. He has been on Levaquin followed by Cipro over the past month. Unfortunately he still seems to have some issues with active infection at this time. I did perform a culture last week in order to evaluate and see if indeed there was still anything going on. Subsequently the culture did come back showing Pseudomonas which is consistent with the drainage has been having which is blue-green in color. He also has had an odor that again was somewhat consistent with Pseudomonas as well. Long story short it appears that the culture showed an intermediate finding with regard to how well the Cipro will work for the Pseudomonas infection. Subsequently being that he does not seem to be clearing up and at best what we are doing is just keeping this at [place] I think he may need to see infectious disease to discuss IV antibiotic options. [name], [name] ([id number]) 07/05/2019 upon evaluation today patient appears to be doing okay in regard to his leg ulcer. He has been tolerating the dressing changes at this point without complication. Fortunately there is no signs of active infection at this time which is good news. No fevers, chills, nausea, vomiting, or diarrhea. With that being said he does have an appointment with infectious disease tomorrow and his primary care on Wednesday. Again the reason for the infectious disease referral was due to the fact that he did not seem to be fully resolving with the use of oral antibiotics and therefore we were thinking that IV antibiotic therapy may be necessary secondary to the fact that there was an intermediate finding for  how effective the Cipro may be. Nonetheless again he has been having a lot of purulent and even green drainage. Fortunately right now that seems to have calmed down over the past week with the reinitiation of the oral antibiotic. Nonetheless we will see what Dr. Megan Salon has to say. 07/12/2019 upon evaluation today patient appears to be  doing about the same at this point in regard to his left lower extremity ulcer. Fortunately there is no signs of active infection at this time which is good news I do believe the Levaquin has been beneficial I did review Dr. Hale Bogus note and to be honest I agree that the patient's leg does appear to be doing better currently. What we found in the past as he does not seem to really completely resolve he will stop the antibiotic and then subsequently things will revert back to having issues with blue-green drainage, increased pain, and overall worsening in general. Obviously that is the reason I sent him back to infectious disease. 07/19/2019 upon evaluation today patient appears to be doing roughly the same in size there is really no dramatic improvement. He has started back on the Levaquin at this point and though he seems to be doing okay he did still have a lot of blue/green drainage noted on evaluation today unfortunately. I think that this is still indicative more likely of a Pseudomonas infection as previously noted and again he does see Dr. Megan Salon in just a couple of days. I do not know that were really able to effectively clear this with just oral antibiotics alone based on what I am seeing currently. Nonetheless we are still continue to try to manage as best we can with regard to the patient and his wound. I do think the wrap was helpful in decreasing the edema which is excellent news. No fevers, chills, nausea, vomiting, or diarrhea. 07/26/2019 upon evaluation today patient appears to be doing slightly better with regard to the overall appearance of the muscle  there is no dark discoloration centrally. Fortunately there is no signs of active infection at this time. No fevers, chills, nausea, vomiting, or diarrhea. Patient's wound bed currently the patient did have an appointment with Dr. Megan Salon at infectious disease last week. With that being said Dr. Megan Salon the patient states was still somewhat hesitant about put him on any IV antibiotics he wanted [place] to repeat cultures today and then see where things go going forward. He does look like Dr. Megan Salon because of some improvement the patient did have with the Levaquin wanted [place] to see about repeating cultures. If it indeed grows the Pseudomonas again then he recommended a possibility of considering a PICC line placement and IV antibiotic therapy. He plans to see the patient back in 1 to 2 weeks. 08/02/2019 upon evaluation today patient appears to be doing poorly with regard to his left lower extremity. We did get the results of his culture back it shows that he is still showing evidence of Pseudomonas which is consistent with the purulent/blue-green drainage that he has currently. Subsequently the culture also shows that he now is showing resistance to the oral fluoroquinolones which is unfortunate as that was really the only thing to treat the infection prior. I do believe that he is looking like this is going require IV antibiotic therapy to get this under control. Fortunately there is no signs of systemic infection at this time which is good news. The patient does see Dr. Megan Salon tomorrow. 08/09/2019 upon evaluation today patient appears to be doing better with regard to his left lower extremity ulcer in regard to the overall appearance. He is currently on IV antibiotic therapy. As ordered by Dr. Megan Salon. Currently the patient is on ceftazidime which she is going to take for the next 2 weeks and then follow-up for 4 to 5-week appointment with Dr. Megan Salon.  The patient started this this past Friday  symptoms have not for a total of 3 days currently in full. 08/16/2019 upon evaluation today patient's wound actually does show muscle in the base of the wound but in general does appear to be much better as far as the overall evidence of infection is concerned. In fact I feel like this is for the most part cleared up he still on the IV antibiotics he has not completed the full course yet but I think he is doing much better which is excellent news. 08/23/2019 upon evaluation today patient appears to be doing about the same with regard to his wound at this point. He tells me that he still has pain unfortunately. Fortunately there is no evidence of systemic infection at this time which is great news. There is significant muscle protrusion. 09/13/19 upon evaluation today patient appears to be doing about the same in regard to his leg unfortunately. He still has a lot of drainage coming from the ulceration there is still muscle exposed. With that being said the patient's last wound culture still showed an intermediate finding with regard to the Pseudomonas he still having the bluish/green drainage as well. Overall I do not know that the wound has completely cleared of infection at this point. Fortunately there is no signs of active infection systemically at this point which is good news. 09/20/2019 upon evaluation today patient's wound actually appears to be doing about the same based on what I am seeing currently. I do not see any signs of systemic infection he still does have evidence of some local infection and drainage. He did see Dr. Megan Salon last week and Dr. Megan Salon states that he probably does need a different IV antibiotic although he does not want to put him on this until the patient begins the Remicade infusion which is actually scheduled for about 10 days out from today on 13 May. Following that time Dr. Megan Salon is good to see him back and then will evaluate the feasibility of starting him on the  IV antibiotic therapy once again at that point. I do not disagree with this plan I do believe as Dr. Megan Salon stated in his note that I reviewed today that the patient's issue is multifactorial with the pyoderma being 1 aspect of this that were hoping the Remicade will be helpful for her. In the meantime I think the gentamicin is, helping to keep things under decent okay control in regard to the ulcer. 09/27/2019 upon evaluation today patient appears to be doing about the same with regard to his wound still there is a lot of muscle exposure though he does have some hyper granulation tissue noted around the edge and actually some granulation tissue starting to form over the muscle which is actually good news. Fortunately there is no evidence of active infection which is also good news. His pain is less at this point. 5/21; this is a patient I have not seen in a long time. 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 He has a large wound on the left lateral leg with protruding muscle. He comes in the clinic today showing the same area on his left medial ankle. He says there is been a spot there for some time although we have not previously defined this. Today he has a clearly defined area with slight amount of skin breakdown surrounded by raised areas with a purplish hue in color. This is not painful he says it is irritated.  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 He still has muscle protruding from the lateral portion of his left leg but fortunately the new area that they were concerned about at his last visit does not appear to have opened at this point. He is currently on Remicade infusions and seems to be doing better in my opinion in fact the wound itself seems to be overall much better. The purplish discoloration that he did have seems to have resolved  and I think that is a good sign that hopefully the Remicade is doing its job. He does have some biofilm noted over the surface of the wound. 11/01/2019 on evaluation today patient's wound actually appears to be doing excellent at this time. Fortunately there is no evidence of active infection and overall I feel like he is making great progress. The Remicade seems to be due excellent job in my opinion. [name], [name] ([id number]) 11/08/19 evaluation today vision actually appears to be doing quite well with regard to his weight ulcer. He's been tolerating dressing changes without complication. Fortunately there is no evidence of infection. No fevers, chills, nausea, or vomiting noted at this time. Overall states that is having more itching than pain which is actually a good sign in my opinion. 12/13/2019 upon evaluation today patient appears to be doing well today with regard to his wound. He has been tolerating the dressing changes without complication. Fortunately there is no sign of active infection at this time. No fevers, chills, nausea, vomiting, or diarrhea. Overall I feel like the infusion therapy has been very beneficial for him. 01/06/2020 on evaluation today patient appears to be doing well with regard to his wound. This is measuring smaller and actually looks to be doing better. Fortunately there is no signs of active infection at this point. No fevers, chills, nausea, vomiting, or diarrhea. With that being said he does still have the blue-green drainage but this does not seem to be causing any significant issues currently. He has been using the gentamicin that does seem to be keeping things under decent control at this point. He goes later this morning for his next infusion therapy for the pyoderma which seems to also be very beneficial. 02/07/2020 on evaluation today patient appears to be doing about the same in regard to his wounds currently. Fortunately there is no signs of active infection  systemically he does still have evidence of local infection still using gentamicin. He also is showing some signs of improvement albeit slowly I do feel like we are making some progress here. 02/21/2020 upon evaluation today patient appears to be making some signs of improvement the wound is measuring a little bit smaller which is great news and overall I am very pleased with where he stands currently. He is going to be having infusion therapy treatment on the 15th of this month. Fortunately there is no signs of active infection at this time. 03/13/2020 I do believe patient's wound is actually showing some signs of improvement here which is great news. He has continue with the infusion therapy through rheumatology/dermatology at [facility]. That does seem to be beneficial. I still think he gets as much benefit from this as he did from the prednisone initially but nonetheless obviously this is less harsh on his body that the prednisone as far as they are concerned. 03/31/2020 on evaluation today patient's wound actually showing signs of some pretty good improvement in regard  to the overall appearance of the wound bed. There is still muscle exposed though he does have some epithelial growth around the edges of the wound. Fortunately there is no signs of active infection at this time. No fevers, chills, nausea, vomiting, or diarrhea. 04/24/2020 upon evaluation today patient appears to be doing about the same in regard to his leg ulcer. He has been tolerating the dressing changes without complication. Fortunately there is no signs of active infection at this time. No fevers, chills, nausea, vomiting, or diarrhea. With that being said he still has a lot of irritation from the bandaging around the edges of the wound. We did discuss today the possibility of a referral to plastic surgery. 05/22/2020 on evaluation today patient appears to be doing well with regard to his wounds all things considered. He has not been able  to get the Chantix apparently there is a recall nurse that I was unaware of put out by [employer] involuntarily. Nonetheless for now I am and I have to do some research into what may be the best option for him to help with quitting in regard to smoking and we discussed that today. 06/26/2020 upon evaluation today patient appears to be doing well with regard to his wound from the standpoint of infection I do not see any signs of infection at this point. With that being said unfortunately he is still continuing to have issues with muscle exposure and again he is not having a whole lot of new skin growth unfortunately. There does not appear to be any signs of active infection at this time. No fevers, chills, nausea, vomiting, or diarrhea. 07/10/2020 upon evaluation today patient appears to be doing a little bit more poorly currently compared to where he was previous. I am concerned currently about an active infection that may be getting worse especially in light of the increased size and tenderness of the wound bed. No fevers, chills, nausea, vomiting, or diarrhea. 07/24/2020 upon evaluation today patient appears to be doing poorly in regard to his leg ulcer. He has been tolerating the dressing changes without complication but unfortunately is having a lot of discomfort. 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 Subsequently I think he is going require possibly IV antibiotics to get this under control. I am very concerned about the severity of his infection and the amount of discomfort he is having. 07/31/2020 upon evaluation today patient appears to be doing about the same in regard to his leg wound. He did see Dr. Megan Salon and Dr. Megan Salon is actually going to start him on IV antibiotics. He goes for the PICC line tomorrow. With that being said there do not have that run for 2 weeks and then see how things are doing and depending on how he  is progressing they may extend that a little longer. Nonetheless I am glad this is getting ready to be in place and definitely feel it may help the patient. In the meantime is been using mainly triamcinolone to the wound bed has an anti-inflammatory. 08/07/2020 on evaluation today patient appears to be doing well with regard to his wound compared even last week. In the interim he has gotten the PICC line placed and overall this seems to be doing excellent. There does not appear to be any evidence of infection which is great news systemically although locally of course has had the infection this appears to be improving with the use of the antibiotics. 08/14/2020 upon  evaluation today patient's wound actually showing signs of excellent improvement. Overall the irritation has significantly improved the drainage is back down to more of a normal level and his pain is really pretty much nonexistent compared to what it was. Obviously I think that this is significantly improved secondary to the IV antibiotic therapy which has made all the difference in the world. Again he had a resistant form of Pseudomonas for which oral antibiotics just was not cutting it. Nonetheless I do think that still we need to consider the possibility of a surgical closure for this wound is been open so long and to be honest with muscle exposed I think this can be very hard to get this to close outside of this although definitely were still working to try to do what we can in that regard. 08/21/2020 upon evaluation today patient appears to be doing very well with regard to his wounds on the left lateral lower extremity/calf area. Fortunately there does not appear to be signs of active infection which is great news and overall very pleased with where things stand today. He is actually wrapping up his treatment with IV antibiotics tomorrow. After that we will see where things go from there. 08/28/2020 upon evaluation today patient appears  to be doing decently well with regard to his leg ulcer. There does not appear to be any signs of active infection which is great news and overall very pleased with where things stand today. No fevers, chills, nausea, vomiting, or diarrhea. 09/18/2020 upon evaluation today patient appears to be doing well with regard to his infection which I feel like is better. Unfortunately he is not doing as well with regard to the overall size of the wound which is not nearly as good at this point. I feel like that he may be having an issue here with the pyoderma being somewhat out of control. I think that he may benefit from potentially going back and talking to the dermatologist about [name], [name] ([id number]) what to do from the pyoderma standpoint. I am not certain if the infusions are helping nearly as much is what the prednisone did in the past. 10/02/2020 upon evaluation today patient appears to be doing well with regard to his leg ulcer. He did go to the Psychiatric nurse. Unfortunately they feel like there is a 10% chance that most that he would be able to heal and that the skin graft would take. Obviously this has led him to not be able to go down that path as far as treatment is concerned. Nonetheless he does seem to be doing a little bit better with the prednisone that I gave him last time. I think that he may need to discuss with dermatology the possibility of long-term prednisone as that seems to be what is most helpful for him to be perfectly honest. I am not sure the Remicade is really doing the job. 10/17/2020 upon evaluation today patient appears to be doing a little better in regard to his wound. In fact the case has been since we did the prednisone on May 2 for him that we have noticed a little bit of improvement each time we have seen a size wise as well as appearance wise as well as pain wise. I think the prednisone has had a greater effect then the infusion therapy has to be perfectly honest. With  that being said the patient also feels significantly better compared to what he was previous. All of this is good news but  nonetheless I am still concerned about the fact that again we are really not set up to long-term manage him as far as prednisone is concerned. Obviously there are things that you need to be watched I completely understand the risk of prednisone usage as well. 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 With all that being said I do believe that we can give him another round of the prednisone which she is requesting today because of the improvement that he seen since we did that first round. 10/30/2020 upon evaluation today patient's wound actually is showing signs of doing quite well. There does not appear to be any evidence of infection which is great news and overall very pleased with where things stand today. No fevers, chills, nausea, vomiting, or diarrhea. He tells me that the prednisone still has seem to have helped he wonders if we can extend that for just a little bit longer. He did not have the appointment with a dermatologist although he did have an infusion appointment last Friday. That was at [facility]. With that being said he tells me he could not do both that as well as the appointment with the physician on the same day therefore that is can have to be rescheduled. I really want to see if there is anything they feel like that could be done differently to try to help this out as I am not really certain that the infusions are helping significantly here. 11/13/2020 upon evaluation today patient unfortunately appears to be doing somewhat poorly in regard to his wound I feel like this is actually worsening from the standpoint of the pyoderma spreading. I still feel like that he may need something different as far as trying to manage this going forward. Again we did the prednisone unfortunately his blood sugars are not doing so well  because of this. Nonetheless I believe that the patient likely needs to try topical steroid. We have done triamcinolone for a while I think going with something stronger such as clobetasol could be beneficial again this is not something I do lightly I discussed this with the patient that again this does not normally put underneath an occlusive dressing. Nonetheless I think a thin film as such could help with some of the stronger anti-inflammatory effects. We discussed this today. He would like to try to give this a trial for the next couple weeks. I definitely think that is something that we can do. Electronic Signature(s) Signed: 11/13/2020 5:52:15 PM By: Worthy Keeler PA-C Entered By: Worthy Keeler on 11/13/2020 17:52:15 JHETT, FRETWELL ([id number]) -------------------------------------------------------------------------------- Physical Exam Details Patient Name: [name] Date of Service: 11/13/2020 8:00 AM Medical Record Number: [id number] Patient Account Number: [id number] Date of Birth/Sex: 09/07/1980 (39 y.o. M) Treating RN: Donnamarie Poag Primary Care Provider: [name] Other Clinician: Referring Provider: [name] Treating Provider/Extender: [name] Cliche in Treatment: 205 Constitutional Obese and well-hydrated in no acute distress. Respiratory normal breathing without difficulty. Psychiatric this patient is able to make decisions and demonstrates good insight into disease process. Alert and Oriented x 3. pleasant and cooperative. Notes Upon inspection patient's wound bed again showed signs of inflammation though I did not really see any evidence of infection. I would like to give the clobetasol as better trial to see if this can be of benefit. The patient is in agreement with that plan. I did actually send that into the pharmacy for him he tells me that  he feels like he may have done this before but again I am not 235% certain that I have never done it  nonetheless it may have been done by somebody else of dermatology or otherwise. Electronic Signature(s) Signed: 11/13/2020 5:52:50 PM By: Worthy Keeler PA-C Entered By: Worthy Keeler on 11/13/2020 17:52:50 [name], [name] ([id number]) -------------------------------------------------------------------------------- Physician Orders Details Patient Name: [name] Date of Service: 11/13/2020 8:00 AM Medical Record Number: [id number] Patient Account Number: [id number] Date of Birth/Sex: 1980-10-07 (39 y.o. 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 Bathing/ Shower/ Hygiene o Clean wound with Normal Saline or wound cleanser. Edema Control - Lymphedema / Segmental Compressive Device / Other o Patient to wear own compression stockings. Remove compression stockings every night before going to bed and put on every morning when getting up. o Other: - Call [facility] for knee high compression socks 20/16m/Hg Medications-Please add to medication list. o Other: - Finish Prednisone as ordered, last day 11/13/20 Start Clobetasol as prescribed, script sent to pharmacy of choice-apply thin to wound as directed every other day-stop with any increased pain Wound Treatment Wound #1 - Lower Leg Wound Laterality: Left, Lateral Cleanser: Soap and Water Discharge Instructions:  Gently cleanse wound with antibacterial soap, rinse and pat dry prior to dressing wounds Primary Dressing: Foam Dressing, 4x4 (in/in) Patient Medications Allergies: milk, Biaxin, seasonal Notifications Medication Indication Start End clobetasol 11/13/2020 DOSE topical 0.05 % ointment - ointment topical apply a thin film to the wound bed then cover with a dressing as directed in the clinic every other day x 2 week Electronic Signature(s) Signed: 11/13/2020 8:49:02 AM By: SWorthy KeelerPA-C Entered By: SWorthy Keeleron 11/13/2020 08:49:01 Welliver, Abdallah ([id number] -------------------------------------------------------------------------------- Problem List Details Patient Name: LHaynes KernsDate of Service: 11/13/2020 8:00 AM Medical Record Number: [id number] Account Number: [id number] of Birth/Sex: 912-05-1980(39 y.o. M) Treating RN: BDonnamarie PoagPrimary Care Provider: CAlma FriendlyOther Clinician: Referring Provider: CAlma FriendlyTreating Provider/Extender: SSkipper Clichein Treatment: 205 Active Problems ICD-10 Encounter Code Description Active Date MDM Diagnosis L97.222 Non-pressure chronic ulcer of left calf with fat layer exposed 12/04/2016 No Yes L88 Pyoderma gangrenosum 03/26/2017 No Yes L97.321 Non-pressure chronic ulcer of left ankle limited to breakdown of skin 10/08/2019 No Yes I87.2 Venous insufficiency (chronic) (peripheral) 12/04/2016 No Yes L03.116 Cellulitis of left lower limb 05/24/2019 No Yes F17.208 Nicotine dependence, unspecified, with other nicotine-induced disorders 04/24/2020 No Yes Inactive Problems ICD-10 Code Description Active Date Inactive Date L97.213 Non-pressure chronic ulcer of right calf with necrosis of muscle 04/02/2017 04/02/2017 Resolved Problems Electronic Signature(s) Signed: 11/13/2020 8:31:41 AM By: SWorthy KeelerPA-C Entered By: SWorthy Keeleron 11/13/2020 08:31:41 Auguste, Gamble  ([id number] -------------------------------------------------------------------------------- Progress Note Details Patient Name: LHaynes KernsDate of Service: 11/13/2020 8:00 AM Medical Record Number: [id number] Account Number: [id number] of Birth/Sex: [birth year](39 y.o. M) Treating RN: BDonnamarie PoagPrimary Care Provider: CAlma FriendlyOther Clinician: Referring Provider: CAlma FriendlyTreating Provider/Extender: SSkipper Clichein Treatment: 205 Subjective Chief  Complaint Information obtained from Patient He is here in follow up evaluation for LLE pyoderma ulcer History of Present Illness (HPI) 12/04/16; 40 year old man who comes into the clinic today for review of a wound on the posterior left calf. He tells me that is been there for about a year. He is not a diabetic he does smoke half a pack per day. He was seen in the ER on 11/20/16 felt to have cellulitis around the wound and was given clindamycin. An x-ray did not show osteomyelitis. The patient initially tells me that he has a milk allergy that sets off a pruritic itching rash on his lower legs which she scratches incessantly and he thinks that's what may have set up the wound. He has been using various topical antibiotics and ointments without any effect. He works in a [employer]ing [employer] and is on his feet all day. He does not have a prior history of wounds however he does have the rash on both lower legs the right arm and the ventral aspect of his left arm. These are excoriations and clearly have had scratching however there are of macular looking areas on both legs including a substantial larger area on the right leg. This does not have an underlying open area. There is no blistering. The patient tells me that 2 years ago in [place] in response to the rash on his legs he saw a dermatologist who told him he had a condition which may be pyoderma gangrenosum although I may be putting words into his mouth. He seemed to  recognize this. 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 He is not in any treatment for this. He's had no recent travel 12/11/16; the patient arrives today with his wound and roughly the same condition we've been using silver alginate this is a deep punched out wound with some surrounding erythema but no tenderness. Biopsy I did did not show confirmed pyoderma gangrenosum suggested nonspecific inflammation and vasculitis but does not provide an actual description of what was seen by the pathologist. I'm really not able to understand this We have also received information from the patient's dermatologist in [place] notes from April 2016. This was a doctor Agarwal-antal. 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 He was prescribed topical steroid high potency under occlusion which helped but at this point the patient did not have a deep punched out wound. 12/18/16; the patient's wound is larger in terms of surface area however this surface looks better and there is less depth. The surrounding erythema also is better. The patient states that the wrap we put on came off 2 days ago when he has been using his compression stockings. He we are in the process of getting a dermatology consult. 12/26/16 on evaluation today patient's left lower extremity wound shows evidence of infection with surrounding erythema noted. He has been tolerating the dressing changes but states that he has noted more discomfort. There is a larger area of erythema surrounding the wound. No fevers, chills, nausea, or vomiting noted at this time. With that being said the wound still does have slough covering the surface. He is not allergic to any medication that he is aware of at this point. In regard to his right lower extremity he had several regions that are erythematous and pruritic he wonders if there's anything we can do to help that. 01/02/17 I reviewed patient's wound culture  which was obtained his visit last week. He was  placed on doxycycline at that point. Unfortunately that does not appear to be an antibiotic that would likely help with the situation however the pseudomonas noted on culture is sensitive to Cipro. Also unfortunately patient's wound seems to have a large compared to last week's evaluation. Not severely so but there are definitely increased measurements in general. He is continuing to have discomfort as well he writes this to be a seven out of 10. In fact he would prefer me not to perform any debridement today due to the fact that he is having discomfort and considering he has an active infection on the little reluctant to do so anyway. No fevers, chills, nausea, or vomiting noted at this time. 01/08/17; patient seems dermatology on September 5. I suspect dermatology will want the slides from the biopsy I did sent to their pathologist. I'm not sure if there is a way we can expedite that. In any case the culture I did before I left on vacation 3 weeks ago showed Pseudomonas he was given 10 days of Cipro and per her description of her intake nurses is actually somewhat better this week although the wound is quite a bit bigger than I remember the last time I saw this. He still has 3 more days of Cipro 01/21/17; dermatology appointment tomorrow. He has completed the ciprofloxacin for Pseudomonas. Surface of the wound looks better however he is had some deterioration in the lesions on his right leg. Meantime the left lateral leg wound we will continue with sample 01/29/17; patient had his dermatology appointment but I can't yet see that note. He is completed his antibiotics. The wound is more superficial but considerably larger in circumferential area than when he came in. This is in his left lateral calf. He also has swollen erythematous areas with superficial wounds on the right leg and small papular areas on both arms. There apparently areas in her his upper  thighs and buttocks I did not look at those. Dermatology biopsied the right leg. Hopefully will have their input next week. 02/05/17; patient went back to see his dermatologist who told him that he had a "scratching problem" as well as staph. He is now on a 30 day course of doxycycline and I believe she gave him triamcinolone cream to the right leg areas to help with the itching [not exactly sure but probably triamcinolone]. She apparently looked at the left lateral leg wound although this was not rebiopsied and I think felt to be ultimately part of the same pathogenesis. He is using sample border foam and changing nevus himself. He now has a new open area on the right posterior leg which was his biopsy site I don't have any of the dermatology notes 02/12/17; we put the patient in compression last week with SANTYL to the wound on the left leg and the biopsy. Edema is much better and the depth of the wound is now at level of skin. Area is still the same Biopsy site on the right lateral leg we've also been using santyl with a border foam dressing and he is changing this himself. 02/19/17; Using silver alginate started last week to both the substantial left leg wound and the biopsy site on the right wound. He is tolerating compression well. Has a an appointment with his primary M.D. tomorrow wondering about diuretics although I'm wondering if the edema problem is actually lymphedema [name], LAUGHTER ([id number]) 02/26/17; the patient has been to see his primary doctor Dr. Jerrel Ivory at [place] our primary  care. She started him on Lasix 20 mg and this seems to have helped with the edema. However we are not making substantial change with the left lateral calf wound and inflammation. The biopsy site on the right leg also looks stable but not really all that different. 03/12/17; the patient has been to see vein and vascular Dr. Lucky Cowboy. He has had venous reflux studies I have not reviewed these. I did get a  call from his dermatology office. 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 They therefore recommended clobetasol and tetracycline i.e. 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 03/26/17; apparently the patient just had reflux studies not an appointment with Dr. dew. She arrives in clinic today having applied clobetasol for 2-3 weeks. He notes over the last 2-3 days excessive drainage having to change the dressing 3-4 times a day and also expanding erythema. 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 Lillia Carmel at [facility] dermatology locally who kindly saw him at my request. 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 My concern was the degree of erythema medially extending from his left leg wound which was either inflammation from pyoderma or cellulitis. I put him on Augmentin however culture of the wound showed Pseudomonas which is quinolone sensitive. I really don't believe he has cellulitis however in view of everything I will continue and give him a course of Cipro. He is also on doxycycline as an immune modulator for the pyoderma. In addition to his original wound on the left lateral leg with surrounding erythema he has a wound on the right posterior calf which was an original biopsy site done by dermatology. 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 Saw Dr.  Lillia Carmel yesterday. He has been using topical antibiotics to both wound areas his original wound on the left and the biopsies/pathergy area on the right. There is definitely some improvement in the inflammation around the wound on the right although the patient states he has increasing sensitivity of the wounds. He is on prednisone 60 and doxycycline 1 as prescribed by Dr. Lillia Carmel. He is covering the topical antibiotic with gauze and putting this in his own compression stocks and changing this daily. He states that Dr. Lottie Rater did a culture of the left leg wound yesterday 05/07/17; pyoderma gangrenosum. The patient saw Dr. Lillia Carmel yesterday and has a follow-up with her in one month. He is still using topical antibiotics to both wounds although he can't recall exactly what type. He is still on prednisone 60 mg. Dr. Lillia Carmel stated that the doxycycline could stop if we were in agreement. 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 The right medial leg was induced by biopsy/pathergy. The area on the right is essentially healed. Still on high-dose prednisone using topical antibiotics to the wound 07/09/17; pyoderma gangrenosum with  wounds on the left lateral leg. The right medial leg has closed and remains closed. He is still on prednisone 60. He tells me he missed his last dermatology appointment with Dr. Lillia Carmel but will make another appointment. He reports that her blood sugar at a recent screen in [place] was high 200's. He was 180 today. 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 I think he is going to require still much longer prednisone perhaps another 3 months before attempting to taper. In the meantime his wound is a lot better. Smaller. He is cleaning this off daily and applying topical antibiotics. When he was last in the clinic I thought about changing to [facility] and  actually put in a couple of calls to dermatology although probably not during their business hours. 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 He continues on oral prednisone. He has been using triple antibiotic ointment. There is surface debris and we will transition to [facility] and have him return in 2 weeks. He has lost 30 pounds since his last appointment with lifestyle modification. He may benefit from topical steroid cream for treatment this can be considered at a later date. 08/22/17 on evaluation today patient appears to actually be doing rather well in regard to his left lateral lower extremity ulcer. He has actually been managed by Dr. Dellia Nims most recently. Patient is currently on oral steroids at this time. This seems to have been of benefit for him. Nonetheless his last visit was actually with Leah on 08/06/17. Currently he is not utilizing any topical steroid creams although this could be of benefit as well. No fevers, chills, nausea, or vomiting noted at this time. 09/05/17 on evaluation today patient appears to be doing better in regard to his left lateral lower extremity ulcer. He has been tolerating the dressing changes without complication. He is using Santyl with good effect. Overall I'm very pleased with how things are standing at this point. Patient likewise is happy that this is doing better. 09/19/17 on evaluation today patient actually appears to be doing rather well in regard to his left lateral lower extremity ulcer. 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 He's not having any significant pain. 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 He has been tolerating the  Santyl without complication and in general I feel like he's making good progress. 10/17/17 on evaluation today patient appears to be doing very well in regard to his left lateral lower surety ulcer. He has been tolerating the dressing changes without complication. There does not appear to be any evidence of infection he's alternating the Santyl and the triple antibiotic ointment every other day this seems to be doing well for him. 11/03/17 on evaluation today patient appears to be doing very well in regard to his left lateral lower extremity ulcer. He is been tolerating the dressing changes without complication which is good news. Fortunately there does not appear to be any evidence of infection which is also great news. Overall is doing excellent they are starting to taper down on the prednisone is down to 40 mg at this point it also started topical clobetasol for him. 11/17/17 on evaluation  today patient appears to be doing well in regard to his left lateral lower surety ulcer. He's been tolerating the dressing changes without complication. He does note that he is having no pain, no excessive drainage or discharge, and overall he feels like things are going about how he would expect and hope they would. Overall he seems to have no evidence of infection at this time in my opinion which is good news. 12/04/17-He is seen in follow-up evaluation for right lateral lower extremity ulcer. He has been applying topical steroid cream. Today's measurement show slight increase in size. Over the next 2 weeks we will transition to every other day Santyl and steroid cream. He has been encouraged to monitor for changes and notify clinic with any concerns 12/15/17 on evaluation today patient's left lateral motion the ulcer and fortunately is doing worse again at this point. This just since last week to this week has close to doubled in size according to the patient. I did not seeing last week's I do not have a visual to  compare this to in our system was also down so we do not have all the charts and at this point. Nonetheless it does have me somewhat concerned in regard to the fact that again he was worried enough about it he has contact the dermatology that placed them back on the full strength, 50 mg a day of the prednisone that he was taken previous. He continues to alternate using clobetasol along with Santyl at this point. He is obviously somewhat frustrated. EMAN, MORIMOTO ([id number]) 12/22/17 on evaluation today patient appears to be doing a little worse compared to last evaluation. Unfortunately the wound is a little deeper and slightly larger than the last week's evaluation. With that being said he has made some progress in regard to the irritation surrounding at this time unfortunately despite that progress that's been made he still has a significant issue going on here. 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 For that reason I discussed with him today the possibility of perform a wound culture to ensure there's nothing overtly infected. 01/06/18 on evaluation today patient's wound is larger and deeper than previously evaluated. With that being said it did appear that his wound was infected after my last evaluation with him. Subsequently I did end up prescribing a prescription for Bactrim DS which she has been taking and having no complication with. Fortunately there does not appear to be any evidence of infection at this point in time as far as anything spreading, no want to touch, and overall I feel like things are showing signs of improvement. 01/13/18 on evaluation today patient appears to be even a little larger and deeper than last time. There still muscle exposed in the base of the wound. Nonetheless he does appear to be less erythematous I do believe  inflammation is calming down also believe the infection looks like it's probably resolved at this time based on what I'm seeing. No fevers, chills, nausea, or vomiting noted at this time. 01/30/18 on evaluation today patient actually appears to visually look better for the most part. Unfortunately those visually this looks better he does seem to potentially have what may be an abscess in the muscle that has been noted in the central portion of the wound. This is the first time that I have noted what appears to be  fluctuance in the central portion of the muscle. With that being said I'm somewhat more concerned about the fact that this might indicate an abscess formation at this location. I do believe that an ultrasound would be appropriate. This is likely something we need to try to do as soon as possible. He has been switch to mupirocin ointment and he is no longer using the steroid ointment as prescribed by dermatology he sees them again next week he's been decreased from 60 to 40 mg of prednisone. 03/09/18 on evaluation today patient actually appears to be doing a little better compared to last time I saw him. There's not as much erythema surrounding the wound itself. He I did review his most recent infectious disease note which was dated 02/24/18. He saw Dr. Michel Bickers in [place]. 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 Patient is now off of antibiotics and they are continually observing this. There seems to be no change in the past two weeks in my pinion based on what the patient says and what I see today compared to what Dr. Megan Salon likely saw two weeks ago. No fevers, chills, nausea, or vomiting noted at this time. 03/23/18 on evaluation today patient's wound actually appears to be showing signs of improvement which is good news. He is currently still on the Dapsone. He is also working on tapering the  prednisone to get off of this and Dr. Lottie Rater is working with him in this regard. Nonetheless overall I feel like the wound is doing well it does appear based on the infectious disease note that I reviewed from Dr. 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 I did also review the results of his ultrasound of left lower extremity which revealed there was a dentist tissue in the base of the wound without an abscess noted. 04/06/18 on evaluation today the patient's left lateral lower extremity ulcer actually appears to be doing fairly well which is excellent news. There does not appear to be any evidence of infection at this time which is also great news. Overall he still does have a significantly large ulceration although little by little he seems to be making progress. He is down to 10 mg a day of the prednisone. 04/20/18 on evaluation today patient actually appears to be doing excellent at this time in regard to his left lower extremity ulcer. He's making signs of good progress unfortunately this is taking much longer than we would really like to see but nonetheless he is making progress. Fortunately there does not appear to be any evidence of infection at this time. No fevers, chills, nausea, or vomiting noted at this time. The patient has not been using the Santyl due to the cost he hadn't got in this field yet. He's mainly been using the antibiotic ointment topically. Subsequently he also tells me that he really has not been scrubbing in the shower I think this would be helpful again as I told him it doesn't have to be anything too aggressive to even make it believe just enough to keep it free of some of the loose slough and biofilm on the wound surface. 05/11/18 on evaluation today patient's wound appears to be making slow but sure progress in regard to the left lateral lower extremity  ulcer.  He is been tolerating the dressing changes without complication. Fortunately there does not appear to be any evidence of infection at this time. He is still just using triple antibiotic ointment along with clobetasol occasionally over the area. He never got the Santyl and really does not seem to intend to in my pinion. 06/01/18 on evaluation today patient appears to be doing a little better in regard to his left lateral lower extremity ulcer. He states that overall he does not feel like he is doing as well with the Dapsone as he did with the prednisone. Nonetheless he sees his dermatologist later today and is gonna talk to them about the possibility of going back on the prednisone. Overall again I believe that the wound would be better if you would utilize Santyl but he really does not seem to be interested in going back to the [name] at this point. He has been using triple antibiotic ointment. 06/15/18 on evaluation today patient's wound actually appears to be doing about the same at this point. Fortunately there is no signs of infection at this time. He has made slight improvements although he continues to not really want to clean the wound bed at this point. He states that he just doesn't mess with it he doesn't want to cause any problems with everything else he has going on. He has been on medication, antibiotics as prescribed by his dermatologist, for a staff infection of his lower extremities which is really drying out now and looking much better he tells me. Fortunately there is no sign of overall infection. 06/29/18 on evaluation today patient appears to be doing well in regard to his left lateral lower surety ulcer all things considering. Fortunately his staff infection seems to be greatly improved compared to previous. He has no signs of infection and this is drying up quite nicely. He is still the doxycycline for this is no longer on cental, Dapsone, or any of the other medications. His  dermatologist has recommended possibility of an infusion but right now he does not want to proceed with that. 07/13/18 on evaluation today patient appears to be doing about the same in regard to his left lateral lower surety ulcer. Fortunately there's no signs of infection at this time which is great news. Unfortunately he still builds up a significant amount of Slough/biofilm of the surface of the wound he still is not really cleaning this as he should be appropriately. Again I'm able to easily with saline and gauze remove the majority of this on the surface which if you would do this at home would likely be a dramatic improvement for him as far as getting the area to improve. Nonetheless overall I still feel like he is making progress is just very slow. I think Santyl will be of benefit for him as well. Still he has not gotten this as of this point. 07/27/18 on evaluation today patient actually appears to be doing little worse in regards of the erythema around the periwound region of the wound he also tells me that he's been having more drainage currently compared to what he was experiencing last time I saw him. He states not quite as bad as what he had because this was infected previously but nonetheless is still appears to be doing poorly. Fortunately there is no evidence of systemic infection at this point. The patient tells me that he is not going to be able to afford the Santyl. He is still waiting to hear about the infusion  therapy with his dermatologist. Apparently she wants an updated colonoscopy first. OTHMAR, RINGER ([id number]) 08/10/18 on evaluation today patient appears to be doing better in regard to his left lateral lower extremity ulcer. Fortunately he is showing signs of improvement in this regard he's actually been approved for Remicade infusion's as well although this has not been scheduled as of yet. Fortunately there's no signs of active infection at this time in regard to the wound  although he is having some issues with infection of the right lower extremity is been seen as dermatologist for this. Fortunately they are definitely still working with him trying to keep things under control. 09/07/18 on evaluation today patient is actually doing rather well in regard to his left lateral lower extremity ulcer. He notes these actually having some hair grow back on his extremity which is something he has not seen in years. He also tells me that the pain is really not giving them any trouble at this time which is also good news overall she is very pleased with the progress he's using a combination of the mupirocin along with the probate is all mixed. 09/21/18 on evaluation today patient actually appears to be doing fairly well all things considered in regard to his looks from the ulcer. He's been tolerating the dressing changes without complication. Fortunately there's no signs of active infection at this time which is good news he is still on all antibiotics or prevention of the staff infection. He has been on prednisone for time although he states it is gonna contact his dermatologist and see if she put them on a short course due to some irritation that he has going on currently. Fortunately there's no evidence of any overall worsening this is going very slow I think cental would be something that would be helpful for him although he states that $50 for tube is quite expensive. He therefore is not willing to get that at this point. 10/06/18 on evaluation today patient actually appears to be doing decently well in regard to his left lateral leg ulcer. He's been tolerating the dressing changes without complication. Fortunately there's no signs of active infection at this time. Overall I'm actually rather pleased with the progress he's making although it's slow he doesn't show any signs of infection and he does seem to be making some improvement. I do believe that he may need a switch up and  dressings to try to help this to heal more appropriately and quickly. 10/19/18 on evaluation today patient actually appears to be doing better in regard to his left lateral lower extremity ulcer. This is shown signs of having much less Slough buildup at this point due to the fact he has been using the [employer]. Obviously this is very good news. The overall size of the wound is not dramatically smaller but again the appearance is. 11/02/18 on evaluation today patient actually appears to be doing quite well in regard to his lower [place] ulcer. A lot of the skin around the ulcer is actually somewhat irritating at this point this seems to be more due to the dressing causing irritation from the adhesive that anything else. Fortunately there is no signs of active infection at this time. 11/24/18 on evaluation today patient appears to be doing a little worse in regard to his overall appearance of his lower extremity ulcer. There's more erythema and warmth around the wound unfortunately. He is currently on doxycycline which he has been on for some time. With that being said  I'm not sure that seems to be helping with what appears to possibly be an acute cellulitis with regard to his left lower extremity ulcer. No fevers, chills, nausea, or vomiting noted at this time. 12/08/18 on evaluation today patient's wounds actually appears to be doing significantly better compared to his last evaluation. He has been using Santyl along with alternating tripling about appointment as well as the steroid cream seems to be doing quite well and the wound is showing signs of improvement which is excellent news. Fortunately there's no evidence of infection and in fact his culture came back negative with only normal skin flora noted. 12/21/2018 upon evaluation today patient actually appears to be doing excellent with regard to his ulcer. This is actually the best that I have seen it since have been helping to take care of him. It is  both smaller as well as less slough noted on the surface of the wound and seems to be showing signs of good improvement with new skin growing from the edges. He has been using just the triamcinolone he does wonder if he can get a refill of that ointment today. 01/04/2019 upon evaluation today patient actually appears to be doing well with regard to his left lateral lower extremity ulcer. With that being said it does not appear to be that he is doing quite as well as last time as far as progression is concerned. There does not appear to be any signs of infection or significant irritation which is good news. With that being said I do believe that he may benefit from switching to a collagen based dressing based on how clean The wound appears. 01/18/2019 on evaluation today patient actually appears to be doing well with regard to his wound on the left lower extremity. He is not made a lot of progress compared to where we were previous but nonetheless does seem to be doing okay at this time which is good news. There is no signs of active infection which is also good news. My only concern currently is I do wish we can get him into utilizing the collagen dressing his insurance would not pay for the supplies that we ordered although it appears that he may be able to order this through his supply company that he typically utilizes. This is [employer]. Nonetheless he did try to order it during the office visit today and it appears this did go through. We will see if he can get that it is a different brand but nonetheless he has collagen and I do think will be beneficial. 02/01/2019 on evaluation today patient actually appears to be doing a little worse today in regard to the overall size of his wounds. Fortunately there is no signs of active infection at this time. That is visually. Nonetheless when this is happened before it was due to infection. For that reason were somewhat concerned about that this time as  well. 02/08/2019 on evaluation today patient unfortunately appears to be doing slightly worse with regard to his wound upon evaluation today. Is measuring a little deeper and a little larger unfortunately. I am not really sure exactly what is causing this to enlarge he actually did see his dermatologist she is going to see about initiating Humira for him. Subsequently she also did do steroid injections into the wound itself in the periphery. Nonetheless still nonetheless he seems to be getting a little bit larger he is gone back to just using the steroid cream topically which I think is  appropriate. I would say hold off on the collagen for the time being is definitely a good thing to do. Based on the culture results which we finally did get the final result back regarding it shows staph as the bacteria noted again that can be a normal skin bacteria based on the fact however he is having increased drainage and worsening of the wound measurement wise I would go ahead and place him on an antibiotic today I do believe for this. 02/15/2019 on evaluation today patient actually appears to be doing somewhat better in regard to his ulcer. There is no signs of worsening at this time I did review his culture results which showed evidence of Staphylococcus aureus but not MRSA. Again this could just be more related to the normal skin bacteria although he states the drainage has slowed down quite a bit he may have had a mild infection not just colonization. And was much smaller and then since around10/04/2019 on evaluation today patient appears to be doing unfortunately worse as far as the size of the wound. I really feel like that this is steadily getting larger again it had been doing excellent right at the beginning of September we have seen a steady increase in the area of the wound it is almost 2-1/2 times the size it was on September 1. Obviously this is a bad trend this is not wanting to see. For that reason  we went back to using just the topical triamcinolone cream which does seem to help with inflammation. I checked him for bacteria by way of culture and nothing showed positive there. I am considering giving him a short course of a tapering steroid Ivey Cina, Shalom ([id number]) today to see if that is can be beneficial for him. The patient is in agreement with giving that a try. 03/08/2019 on evaluation today patient appears to be doing very well in comparison to last evaluation with regard to his lower extremity ulcer. This is showing signs of less inflammation and actually measuring slightly smaller compared to last time every other week over the past month and a half he has been measuring larger larger larger. Nonetheless I do believe that the issue has been inflammation the prednisone does seem to have been beneficial for him which is good news. No fevers, chills, nausea, vomiting, or diarrhea. 03/22/2019 on evaluation today patient appears to be doing about the same with regard to his leg ulcer. He has been tolerating the dressing changes without complication. With that being said the wound seems to be mostly arrested at its current size but really is not making any progress except for when we prescribed the prednisone. He did show some signs of dropping as far as the overall size of the wound during that interval week. Nonetheless this is something he is not on long-term at this point and unfortunately I think he is getting need either this or else the Humira which his dermatologist has discussed try to get approval for. With that being said he will be seeing his dermatologist on the 11th of this month that is November. 04/19/2019 on evaluation today patient appears to be doing really about the same the wound is measuring slightly larger compared to last time I saw him. He has not been into the office since November 2 due to the fact that he unfortunately had Covid as that his entire family. He  tells me that it was rough but they did pull-through and he seems to be doing much  better. Fortunately there is no signs of active infection at this time. No fevers, chills, nausea, vomiting, or diarrhea. 05/10/2019 on evaluation today patient unfortunately appears to be doing significantly worse as compared to last time I saw him. He does tell me that he has had his first dose of Humira and actually is scheduled to get the next one in the upcoming week. With that being said he tells me also that in the past several days he has been having a lot of issues with green drainage she showed me a picture this is more blue-green in color. He is also been having issues with increased sloughy buildup and the wound does appear to be larger today. Obviously this is not the direction that we want everything to take based on the starting of his Humira. Nonetheless I think this is definitely a result of likely infection and to be honest I think this is probably Pseudomonas causing the infection based on what I am seeing. 05/24/2019 on evaluation today patient unfortunately appears to be doing significantly worse compared to his prior evaluation with me 2 weeks ago. I did review his culture results which showed that he does have Staph aureus as well as Pseudomonas noted on the culture. Nonetheless the Levaquin that I prescribed for him does not appear to have been appropriate and in fact he tells me he is no longer experiencing the green drainage and discharge that he had at the last visit. Fortunately there is no signs of active infection at this time which is good news although the wound has significantly worsened it in fact is much deeper than it was previous. We have been utilizing up to this point triamcinolone ointment as the prescription topical of choice but at this time I really feel like that the wound is getting need to be packed in order to appropriately manage this due to the deeper nature of the wound.  Therefore something along the lines of an alginate dressing may be more appropriate. 05/31/2019 upon inspection today patient's wound actually showed signs of doing poorly at this point. Unfortunately he just does not seem to be making any good progress despite what we have tried. He actually did go ahead and pick up the Cipro and start taking that as he was noticing more green drainage he had previously completed the Levaquin that I prescribed for him as well. Nonetheless he missed his appointment for the seventh last week on Wednesday with the wound care center and [facility] where his dermatologist referred him. Obviously I do think a second opinion would be helpful at this point especially in light of the fact that the patient seems to be doing so poorly despite the fact that we have tried everything that I really know how at this point. The only thing that ever seems to have helped him in the past is when he was on high doses of continual steroids that did seem to make a difference for him. 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 06/07/2019 upon evaluation today patient unfortunately appears to be doing worse yet again with regard to his wound. In fact I am starting to question whether or not he may have a fluid pocket in the muscle at this point based on the bulging and the soft appearance to  the central portion of the muscle area. There is not anything draining from the muscle itself at this time which is good news but nonetheless the wound is expanding. I am not really seeing any results of the Humira as far as overall wound progression based on what I am seeing at this point. The patient has been referred for second opinion with regard to his wound to the [facility] wound care center by his dermatologist which I definitely am not  in opposition to. Unfortunately we tried multiple dressings in the past including collagen, alginate, and at one point even Hydrofera Blue. With that being said he is never really used it for any significant amount of time due to the fact that he often complains of pain associated with these dressings and then will go back to either using the Santyl which she has done intermittently or more frequently the triamcinolone. He is also using his own compression stockings. We have wrapped him in the past but again that was something else that he really was not a big fan of. 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 He seemed to do much better when he was on the steroids although honestly I understand that the steroids are not necessarily the best medication to be on long-term obviously 06/14/2019 on evaluation today patient appears to be doing actually a little bit better with regard to the overall appearance with his leg. Unfortunately he does continue to have issues with what appears to be some fluid underneath the muscle although he did see the wound specialty center at [facility] last week their main goals were to see about infusion therapy in place of the Humira as they feel like that is not quite strong enough. They also recommended that we continue with the treatment otherwise as we are they felt like that was appropriate and they are okay with him continuing to follow-up here with [place] in that regard. With that being said they are also sending him to the vein specialist there to see about vein stripping and if that would be of benefit for him. Subsequently they also did not really address  whether or not an ultrasound of the muscle area to see if there is anything that needs to be addressed here would be appropriate or not. For that reason I discussed this with him last week I think we may proceed down that road at this point. 06/21/2019 upon evaluation today patient's wound actually appears to be doing slightly better compared to previous evaluations. I do believe that he has made a difference with regard to the progression here with the use of oral steroids. Again in the past has been the only thing that is really calm things down. He does tell me that from [facility] is gotten a good news from there that there are no further vein stripping that is necessary at this point. I do not have that available for review today although the patient did relay this to me. He also did obtain and have the ultrasound of the wound completed which I did sign off on today. It does appear that there is no fluid collection under  the muscle this is likely then just edematous tissue in general. That is also good news. Overall I still believe the inflammation is the main issue here. He did inquire about the possibility of a wound VAC again with the muscle protruding like it is I am not really sure whether the wound VAC is necessarily ideal or not. That is something we will have to consider although I do believe he may need compression wrapping to try to help with edema control which could potentially be of benefit. 06/28/2019 on evaluation today patient appears to be doing slightly better measurement wise although this is not terribly smaller he least seems to be trending towards that direction. With that being said he still seems to have purulent drainage noted in the wound bed at this time. He has been on Levaquin followed by Cipro over the past month. Unfortunately he still seems to have some issues with active infection at this time. I did perform a culture last week in order to evaluate and see if indeed there  was still anything going on. Subsequently the culture did come back Orellana, Muhannad ([id number]) showing Pseudomonas which is consistent with the drainage has been having which is blue-green in color. He also has had an odor that again was somewhat consistent with Pseudomonas as well. Long story short it appears that the culture showed an intermediate finding with regard to how well the Cipro will work for the Pseudomonas infection. Subsequently being that he does not seem to be clearing up and at best what we are doing is just keeping this at [place] I think he may need to see infectious disease to discuss IV antibiotic options. 07/05/2019 upon evaluation today patient appears to be doing okay in regard to his leg ulcer. He has been tolerating the dressing changes at this point without complication. Fortunately there is no signs of active infection at this time which is good news. No fevers, chills, nausea, vomiting, or diarrhea. With that being said he does have an appointment with infectious disease tomorrow and his primary care on Wednesday. Again the reason for the infectious disease referral was due to the fact that he did not seem to be fully resolving with the use of oral antibiotics and therefore we were thinking that IV antibiotic therapy may be necessary secondary to the fact that there was an intermediate finding for how effective the Cipro may be. Nonetheless again he has been having a lot of purulent and even green drainage. Fortunately right now that seems to have calmed down over the past week with the reinitiation of the oral antibiotic. Nonetheless we will see what Dr. Megan Salon has to say. 07/12/2019 upon evaluation today patient appears to be doing about the same at this point in regard to his left lower extremity ulcer. Fortunately there is no signs of active infection at this time which is good news I do believe the Levaquin has been beneficial I did review Dr. Hale Bogus note and to be  honest I agree that the patient's leg does appear to be doing better currently. What we found in the past as he does not seem to really completely resolve he will stop the antibiotic and then subsequently things will revert back to having issues with blue-green drainage, increased pain, and overall worsening in general. Obviously that is the reason I sent him back to infectious disease. 07/19/2019 upon evaluation today patient appears to be doing roughly the same in size there is really no dramatic improvement. He  has started back on the Levaquin at this point and though he seems to be doing okay he did still have a lot of blue/green drainage noted on evaluation today unfortunately. I think that this is still indicative more likely of a Pseudomonas infection as previously noted and again he does see Dr. Megan Salon in just a couple of days. I do not know that were really able to effectively clear this with just oral antibiotics alone based on what I am seeing currently. Nonetheless we are still continue to try to manage as best we can with regard to the patient and his wound. I do think the wrap was helpful in decreasing the edema which is excellent news. No fevers, chills, nausea, vomiting, or diarrhea. 07/26/2019 upon evaluation today patient appears to be doing slightly better with regard to the overall appearance of the muscle there is no dark discoloration centrally. Fortunately there is no signs of active infection at this time. No fevers, chills, nausea, vomiting, or diarrhea. Patient's wound bed currently the patient did have an appointment with Dr. Megan Salon at infectious disease last week. With that being said Dr. Megan Salon the patient states was still somewhat hesitant about put him on any IV antibiotics he wanted [place] to repeat cultures today and then see where things go going forward. He does look like Dr. Megan Salon because of some improvement the patient did have with the Levaquin wanted [place] to see  about repeating cultures. If it indeed grows the Pseudomonas again then he recommended a possibility of considering a PICC line placement and IV antibiotic therapy. He plans to see the patient back in 1 to 2 weeks. 08/02/2019 upon evaluation today patient appears to be doing poorly with regard to his left lower extremity. We did get the results of his culture back it shows that he is still showing evidence of Pseudomonas which is consistent with the purulent/blue-green drainage that he has currently. Subsequently the culture also shows that he now is showing resistance to the oral fluoroquinolones which is unfortunate as that was really the only thing to treat the infection prior. I do believe that he is looking like this is going require IV antibiotic therapy to get this under control. Fortunately there is no signs of systemic infection at this time which is good news. The patient does see Dr. Megan Salon tomorrow. 08/09/2019 upon evaluation today patient appears to be doing better with regard to his left lower extremity ulcer in regard to the overall appearance. He is currently on IV antibiotic therapy. As ordered by Dr. Megan Salon. Currently the patient is on ceftazidime which she is going to take for the next 2 weeks and then follow-up for 4 to 5-week appointment with Dr. Megan Salon. The patient started this this past Friday symptoms have not for a total of 3 days currently in full. 08/16/2019 upon evaluation today patient's wound actually does show muscle in the base of the wound but in general does appear to be much better as far as the overall evidence of infection is concerned. In fact I feel like this is for the most part cleared up he still on the IV antibiotics he has not completed the full course yet but I think he is doing much better which is excellent news. 08/23/2019 upon evaluation today patient appears to be doing about the same with regard to his wound at this point. He tells me that he still  has pain unfortunately. Fortunately there is no evidence of systemic infection at this time  which is great news. There is significant muscle protrusion. 09/13/19 upon evaluation today patient appears to be doing about the same in regard to his leg unfortunately. He still has a lot of drainage coming from the ulceration there is still muscle exposed. With that being said the patient's last wound culture still showed an intermediate finding with regard to the Pseudomonas he still having the bluish/green drainage as well. Overall I do not know that the wound has completely cleared of infection at this point. Fortunately there is no signs of active infection systemically at this point which is good news. 09/20/2019 upon evaluation today patient's wound actually appears to be doing about the same based on what I am seeing currently. I do not see any signs of systemic infection he still does have evidence of some local infection and drainage. He did see Dr. Megan Salon last week and Dr. Megan Salon states that he probably does need a different IV antibiotic although he does not want to put him on this until the patient begins the Remicade infusion which is actually scheduled for about 10 days out from today on 13 May. Following that time Dr. Megan Salon is good to see him back and then will evaluate the feasibility of starting him on the IV antibiotic therapy once again at that point. I do not disagree with this plan I do believe as Dr. Megan Salon stated in his note that I reviewed today that the patient's issue is multifactorial with the pyoderma being 1 aspect of this that were hoping the Remicade will be helpful for her. In the meantime I think the gentamicin is, helping to keep things under decent okay control in regard to the ulcer. 09/27/2019 upon evaluation today patient appears to be doing about the same with regard to his wound still there is a lot of muscle exposure though he does have some hyper granulation  tissue noted around the edge and actually some granulation tissue starting to form over the muscle which is actually good news. Fortunately there is no evidence of active infection which is also good news. His pain is less at this point. 5/21; this is a patient I have not seen in a long time. 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 He has a large wound on the left lateral leg with protruding muscle. He comes in the clinic today showing the same area on his left medial ankle. He says there is been a spot there for some time although we have not previously defined this. Today he has a clearly defined area with slight amount of skin breakdown surrounded by raised areas with a purplish hue in color. This is not painful he says it is irritated. 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 He still has muscle protruding from the lateral portion of his left leg but fortunately the new area that they were concerned about at his last visit does not appear to have opened at this point. He is currently on Remicade infusions and seems to be doing better in my opinion in fact the wound itself seems to be overall much better. The purplish discoloration that he did have seems to have resolved and I think that is a good sign that hopefully the Remicade is doing its job. He does Bushart, [place] ([id number]) have some biofilm noted over the surface of the wound. 11/01/2019 on evaluation today patient's wound actually appears  to be doing excellent at this time. Fortunately there is no evidence of active infection and overall I feel like he is making great progress. The Remicade seems to be due excellent job in my opinion. 11/08/19 evaluation today vision actually appears to be doing quite well with regard to his weight ulcer. He's been tolerating dressing changes without  complication. Fortunately there is no evidence of infection. No fevers, chills, nausea, or vomiting noted at this time. Overall states that is having more itching than pain which is actually a good sign in my opinion. 12/13/2019 upon evaluation today patient appears to be doing well today with regard to his wound. He has been tolerating the dressing changes without complication. Fortunately there is no sign of active infection at this time. No fevers, chills, nausea, vomiting, or diarrhea. Overall I feel like the infusion therapy has been very beneficial for him. 01/06/2020 on evaluation today patient appears to be doing well with regard to his wound. This is measuring smaller and actually looks to be doing better. Fortunately there is no signs of active infection at this point. No fevers, chills, nausea, vomiting, or diarrhea. With that being said he does still have the blue-green drainage but this does not seem to be causing any significant issues currently. He has been using the gentamicin that does seem to be keeping things under decent control at this point. He goes later this morning for his next infusion therapy for the pyoderma which seems to also be very beneficial. 02/07/2020 on evaluation today patient appears to be doing about the same in regard to his wounds currently. Fortunately there is no signs of active infection systemically he does still have evidence of local infection still using gentamicin. He also is showing some signs of improvement albeit slowly I do feel like we are making some progress here. 02/21/2020 upon evaluation today patient appears to be making some signs of improvement the wound is measuring a little bit smaller which is great news and overall I am very pleased with where he stands currently. He is going to be having infusion therapy treatment on the 15th of this month. Fortunately there is no signs of active infection at this time. 03/13/2020 I do believe patient's  wound is actually showing some signs of improvement here which is great news. He has continue with the infusion therapy through rheumatology/dermatology at [facility]. That does seem to be beneficial. I still think he gets as much benefit from this as he did from the prednisone initially but nonetheless obviously this is less harsh on his body that the prednisone as far as they are concerned. 03/31/2020 on evaluation today patient's wound actually showing signs of some pretty good improvement in regard to the overall appearance of the wound bed. There is still muscle exposed though he does have some epithelial growth around the edges of the wound. Fortunately there is no signs of active infection at this time. No fevers, chills, nausea, vomiting, or diarrhea. 04/24/2020 upon evaluation today patient appears to be doing about the same in regard to his leg ulcer. He has been tolerating the dressing changes without complication. Fortunately there is no signs of active infection at this time. No fevers, chills, nausea, vomiting, or diarrhea. With that being said he still has a lot of irritation from the bandaging around the edges of the wound. We did discuss today the possibility of a referral to plastic surgery. 05/22/2020 on evaluation today patient appears to be doing well with regard to  his wounds all things considered. He has not been able to get the Chantix apparently there is a recall nurse that I was unaware of put out by [employer] involuntarily. Nonetheless for now I am and I have to do some research into what may be the best option for him to help with quitting in regard to smoking and we discussed that today. 06/26/2020 upon evaluation today patient appears to be doing well with regard to his wound from the standpoint of infection I do not see any signs of infection at this point. With that being said unfortunately he is still continuing to have issues with muscle exposure and again he is not having a whole  lot of new skin growth unfortunately. There does not appear to be any signs of active infection at this time. No fevers, chills, nausea, vomiting, or diarrhea. 07/10/2020 upon evaluation today patient appears to be doing a little bit more poorly currently compared to where he was previous. I am concerned currently about an active infection that may be getting worse especially in light of the increased size and tenderness of the wound bed. No fevers, chills, nausea, vomiting, or diarrhea. 07/24/2020 upon evaluation today patient appears to be doing poorly in regard to his leg ulcer. He has been tolerating the dressing changes without complication but unfortunately is having a lot of discomfort. 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 Subsequently I think he is going require possibly IV antibiotics to get this under control. I am very concerned about the severity of his infection and the amount of discomfort he is having. 07/31/2020 upon evaluation today patient appears to be doing about the same in regard to his leg wound. He did see Dr. Megan Salon and Dr. Megan Salon is actually going to start him on IV antibiotics. He goes for the PICC line tomorrow. With that being said there do not have that run for 2 weeks and then see how things are doing and depending on how he is progressing they may extend that a little longer. Nonetheless I am glad this is getting ready to be in place and definitely feel it may help the patient. In the meantime is been using mainly triamcinolone to the wound bed has an anti-inflammatory. 08/07/2020 on evaluation today patient appears to be doing well with regard to his wound compared even last week. In the interim he has gotten the PICC line placed and overall this seems to be doing excellent. There does not appear to be any evidence of infection which is great news systemically although locally of course has had  the infection this appears to be improving with the use of the antibiotics. 08/14/2020 upon evaluation today patient's wound actually showing signs of excellent improvement. Overall the irritation has significantly improved the drainage is back down to more of a normal level and his pain is really pretty much nonexistent compared to what it was. Obviously I think that this is significantly improved secondary to the IV antibiotic therapy which has made all the difference in the world. Again he had a resistant form of Pseudomonas for which oral antibiotics just was not cutting it. Nonetheless I do think that still we need to consider the possibility of a surgical closure for this wound is been open so long and to be honest with muscle exposed I think this can be very hard to get this to close outside of this although definitely were still working to try to  do what we can in that regard. 08/21/2020 upon evaluation today patient appears to be doing very well with regard to his wounds on the left lateral lower extremity/calf area. Fortunately there does not appear to be signs of active infection which is great news and overall very pleased with where things stand today. He is actually wrapping up his treatment with IV antibiotics tomorrow. After that we will see where things go from there. 08/28/2020 upon evaluation today patient appears to be doing decently well with regard to his leg ulcer. There does not appear to be any signs of Stinger, [place] ([id number]) active infection which is great news and overall very pleased with where things stand today. No fevers, chills, nausea, vomiting, or diarrhea. 09/18/2020 upon evaluation today patient appears to be doing well with regard to his infection which I feel like is better. Unfortunately he is not doing as well with regard to the overall size of the wound which is not nearly as good at this point. I feel like that he may be having an issue here with the pyoderma  being somewhat out of control. I think that he may benefit from potentially going back and talking to the dermatologist about what to do from the pyoderma standpoint. I am not certain if the infusions are helping nearly as much is what the prednisone did in the past. 10/02/2020 upon evaluation today patient appears to be doing well with regard to his leg ulcer. He did go to the Psychiatric nurse. Unfortunately they feel like there is a 10% chance that most that he would be able to heal and that the skin graft would take. Obviously this has led him to not be able to go down that path as far as treatment is concerned. Nonetheless he does seem to be doing a little bit better with the prednisone that I gave him last time. I think that he may need to discuss with dermatology the possibility of long-term prednisone as that seems to be what is most helpful for him to be perfectly honest. I am not sure the Remicade is really doing the job. 10/17/2020 upon evaluation today patient appears to be doing a little better in regard to his wound. In fact the case has been since we did the prednisone on May 2 for him that we have noticed a little bit of improvement each time we have seen a size wise as well as appearance wise as well as pain wise. I think the prednisone has had a greater effect then the infusion therapy has to be perfectly honest. With that being said the patient also feels significantly better compared to what he was previous. All of this is good news but nonetheless I am still concerned about the fact that again we are really not set up to long-term manage him as far as prednisone is concerned. Obviously there are things that you need to be watched I completely understand the risk of prednisone usage as well. 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 With all that being said I do believe that we can give him another round of the prednisone which she is  requesting today because of the improvement that he seen since we did that first round. 10/30/2020 upon evaluation today patient's wound actually is showing signs of doing quite well. There does not appear to be any evidence of infection which is great news and overall very pleased with where things  stand today. No fevers, chills, nausea, vomiting, or diarrhea. He tells me that the prednisone still has seem to have helped he wonders if we can extend that for just a little bit longer. He did not have the appointment with a dermatologist although he did have an infusion appointment last Friday. That was at [facility]. With that being said he tells me he could not do both that as well as the appointment with the physician on the same day therefore that is can have to be rescheduled. I really want to see if there is anything they feel like that could be done differently to try to help this out as I am not really certain that the infusions are helping significantly here. 11/13/2020 upon evaluation today patient unfortunately appears to be doing somewhat poorly in regard to his wound I feel like this is actually worsening from the standpoint of the pyoderma spreading. I still feel like that he may need something different as far as trying to manage this going forward. Again we did the prednisone unfortunately his blood sugars are not doing so well because of this. Nonetheless I believe that the patient likely needs to try topical steroid. We have done triamcinolone for a while I think going with something stronger such as clobetasol could be beneficial again this is not something I do lightly I discussed this with the patient that again this does not normally put underneath an occlusive dressing. Nonetheless I think a thin film as such could help with some of the stronger anti-inflammatory effects. We discussed this today. He would like to try to give this a trial for the next couple weeks. I definitely think that  is something that we can do. Objective Constitutional Obese and well-hydrated in no acute distress. Vitals Time Taken: 8:03 AM, Height: 71 in, Weight: 338 lbs, BMI: 47.1, Temperature: 98.6 F, Pulse: 88 bpm, Respiratory Rate: 16 breaths/min, Blood Pressure: 120/79 mmHg. Respiratory normal breathing without difficulty. Psychiatric this patient is able to make decisions and demonstrates good insight into disease process. Alert and Oriented x 3. pleasant and cooperative. General Notes: Upon inspection patient's wound bed again showed signs of inflammation though I did not really see any evidence of infection. I would like to give the clobetasol as better trial to see if this can be of benefit. The patient is in agreement with that plan. I did actually send that into the pharmacy for him he tells me that he feels like he may have done this before but again I am not 161% certain that I have never done it nonetheless it may have been done by somebody else of dermatology or otherwise. Integumentary (Hair, Skin) Wound #1 status is Open. Original cause of wound was Gradually Appeared. The date acquired was: 11/18/2015. The wound has been in treatment 205 weeks. The wound is located on the Left,Lateral Lower Leg. The wound measures 6.5cm length x 7.9cm width x 0.8cm depth; 40.33cm^2 area and 32.264cm^3 volume. There is Fat Layer (Subcutaneous Tissue) exposed. There is no tunneling noted, however, there is undermining starting at 3:00 and ending at 3:00 with a maximum distance of 0.3cm. There is a large amount of purulent drainage noted. The wound margin is epibole. There is medium (34-66%) red, pink granulation within the wound bed. There is a medium (34-66%) amount of necrotic tissue within the wound bed. KACE, HARTJE ([id number]) Assessment Active Problems ICD-10 Non-pressure chronic ulcer of left calf with fat layer exposed Pyoderma gangrenosum Non-pressure chronic ulcer of left  ankle limited to  breakdown of skin Venous insufficiency (chronic) (peripheral) Cellulitis of left lower limb Nicotine dependence, unspecified, with other nicotine-induced disorders Plan Follow-up Appointments: Return Appointment in 2 weeks. Bathing/ Shower/ Hygiene: Clean wound with Normal Saline or wound cleanser. Edema Control - Lymphedema / Segmental Compressive Device / Other: Patient to wear own compression stockings. Remove compression stockings every night before going to bed and put on every morning when getting up. Other: - Call [facility] for knee high compression socks 20/58m/Hg Medications-Please add to medication list.: Other: - Finish Prednisone as ordered, last day 11/13/20 Start Clobetasol as prescribed, script sent to pharmacy of choice-apply thin to wound as directed every other day-stop with any increased pain The following medication(s) was prescribed: clobetasol topical 0.05 % ointment ointment topical apply a thin film to the wound bed then cover with a dressing as directed in the clinic every other day x 2 week starting 11/13/2020 WOUND #1: - Lower Leg Wound Laterality: Left, Lateral Cleanser: Soap and Water Discharge Instructions: Gently cleanse wound with antibacterial soap, rinse and pat dry prior to dressing wounds Primary Dressing: Foam Dressing, 4x4 (in/in) 1. Would recommend currently that we go ahead and continue with the wound care measures essentially as before reusing the Tubigrip. With that being said we will get a use clobetasol on the wound bed followed by the border foam dressing and then the Tubigrip or honestly a stronger compression sock would be even better I discussed that with him today he is going to look into getting stronger compression in the 20-30 mmHg range. 2. I am also can recommend that we give the trial of the clobetasol for a couple of weeks and see how things look in regard to his wound. He does not have his appointment and follow-up with  dermatology until the end of July. That means when the meantime but that I do something to try to help out with this. We will see patient back for reevaluation in 1 week here in the clinic. If anything worsens or changes patient will contact our office for additional recommendations. Electronic Signature(s) Signed: 11/13/2020 5:54:04 PM By: SWorthy KeelerPA-C Entered By: SWorthy Keeleron 11/13/2020 17:54:04 Fournier, Kimber ([id number] -------------------------------------------------------------------------------- SuperBill Details Patient Name: LHaynes KernsDate of Service: 11/13/2020 Medical Record Number: [id number] Account Number: [id number] of Birth/Sex: [birth year](40y.o. M) Treating RN: BDonnamarie PoagPrimary Care Provider: CAlma FriendlyOther Clinician: Referring Provider: CAlma FriendlyTreating Provider/Extender: SSkipper Clichein Treatment: 205 Diagnosis Coding ICD-10 Codes Code Description [id number]-pressure chronic ulcer of left calf with fat layer exposed L88 Pyoderma gangrenosum L97.321 Non-pressure chronic ulcer of left ankle limited to breakdown of skin I87.2 Venous insufficiency (chronic) (peripheral) L03.116 Cellulitis of left lower limb F17.208 Nicotine dependence, unspecified, with other nicotine-induced disorders Facility Procedures CPT4 Code: [id number]: [id number]- WOUND CARE VISIT-LEV 2 EST PT Modifier: Quantity: 1 Physician Procedures CPT4 Code: [id number]: [id number]- WC PHYS LEVEL 4 - EST PT Modifier: Quantity: 1 CPT4 Code: Description: ICD-10 Diagnosis Description L97.222 Non-pressure chronic ulcer of left calf with fat layer exposed L88 Pyoderma gangrenosum L97.321 Non-pressure chronic ulcer of left ankle limited to breakdown of skin I87.2 Venous insufficiency (chronic)  (peripheral) Modifier: Quantity: Electronic Signature(s) Signed: 11/13/2020 5:54:40 PM By: SWorthy KeelerPA-C Previous Signature: 11/13/2020  10:59:38 AM Version By: BDonnamarie PoagEntered By: SWorthy Keeleron 11/13/2020 17:54:39

## 2020-11-14 NOTE — Progress Notes (Signed)
[name] ([id number]) Visit Report for 11/13/2020 Arrival Information Details Patient Name: [name], [name] Date of Service: 11/13/2020 8:00 AM Medical Record Number: [id number] Patient Account Number: [id number] Date of Birth/Sex: 12/16/1980 (40 y.o. M) Treating RN: Donnamarie Poag Primary Care Nayson Traweek: [name] Other Clinician: Referring Nealy Hickmon: [name] Treating Hiilei Gerst/Extender: [name] Cliche in Treatment: 22 Visit Information History Since Last Visit Added or deleted any medications: No Patient Arrived: Ambulatory Had a fall or experienced change in No Arrival Time: 08:06 activities of daily living that may affect Accompanied By: self risk of falls: Transfer Assistance: None Hospitalized since last visit: No Patient Identification Verified: Yes Pain Present Now: Yes Secondary Verification Process Completed: Yes Patient Requires Transmission-Based Precautions: No Patient Has Alerts: Yes Electronic Signature(s) Signed: 11/13/2020 10:59:38 AM By: Donnamarie Poag Entered By: Donnamarie Poag on 11/13/2020 08:06:32 [name] ([id number]) -------------------------------------------------------------------------------- Clinic Level of Care Assessment Details Patient Name: [name] Date of Service: 11/13/2020 8:00 AM Medical Record Number: [id number] Patient Account Number: [id number] Date of Birth/Sex: Jun 27, 1980 (39 y.o. M) Treating RN: Donnamarie Poag Primary Care Taylar Hartsough: [name] Other Clinician: Referring Lennyn Gange: [name] Treating Mane Consolo/Extender: [name] Cliche in Treatment: 205 Clinic Level of Care Assessment Items TOOL 4 Quantity Score []  - Use when only an EandM is performed on FOLLOW-UP visit 0 ASSESSMENTS - Nursing Assessment / Reassessment []  - Reassessment of Co-morbidities (includes updates in patient status) 0 []  - 0 Reassessment of Adherence to Treatment Plan ASSESSMENTS - Wound and Skin Assessment / Reassessment X -  Simple Wound Assessment / Reassessment - one wound 1 5 []  - 0 Complex Wound Assessment / Reassessment - multiple wounds []  - 0 Dermatologic / Skin Assessment (not related to wound area) ASSESSMENTS - Focused Assessment []  - Circumferential Edema Measurements - multi extremities 0 []  - 0 Nutritional Assessment / Counseling / Intervention []  - 0 Lower Extremity Assessment (monofilament, tuning fork, pulses) []  - 0 Peripheral Arterial Disease Assessment (using hand held doppler) ASSESSMENTS - Ostomy and/or Continence Assessment and Care []  - Incontinence Assessment and Management 0 []  - 0 Ostomy Care Assessment and Management (repouching, etc.) PROCESS - Coordination of Care X - Simple Patient / Family Education for ongoing care 1 15 []  - 0 Complex (extensive) Patient / Family Education for ongoing care []  - 0 Staff obtains Programmer, systems, Records, Test Results / Process Orders []  - 0 Staff telephones HHA, Nursing Homes / Clarify orders / etc []  - 0 Routine Transfer to another Facility (non-emergent condition) []  - 0 Routine Hospital Admission (non-emergent condition) []  - 0 New Admissions / Biomedical engineer / Ordering NPWT, Apligraf, etc. []  - 0 Emergency Hospital Admission (emergent condition) X- 1 10 Simple Discharge Coordination []  - 0 Complex (extensive) Discharge Coordination PROCESS - Special Needs []  - Pediatric / Minor Patient Management 0 []  - 0 Isolation Patient Management []  - 0 Hearing / Language / Visual special needs []  - 0 Assessment of Community assistance (transportation, D/C planning, etc.) []  - 0 Additional assistance / Altered mentation []  - 0 Support Surface(s) Assessment (bed, cushion, seat, etc.) INTERVENTIONS - Wound Cleansing / Measurement [name], [name] ([id number]) X- 1 5 Simple Wound Cleansing - one wound []  - 0 Complex Wound Cleansing - multiple wounds []  - 0 Wound Imaging (photographs - any number of wounds) []  - 0 Wound Tracing  (instead of photographs) X- 1 5 Simple Wound Measurement - one wound []  - 0 Complex Wound Measurement - multiple wounds INTERVENTIONS - Wound Dressings X - Small Wound Dressing one  or multiple wounds 1 10 []  - 0 Medium Wound Dressing one or multiple wounds []  - 0 Large Wound Dressing one or multiple wounds []  - 0 Application of Medications - topical []  - 0 Application of Medications - injection INTERVENTIONS - Miscellaneous []  - External ear exam 0 []  - 0 Specimen Collection (cultures, biopsies, blood, body fluids, etc.) []  - 0 Specimen(s) / Culture(s) sent or taken to Lab for analysis []  - 0 Patient Transfer (multiple staff / Civil Service fast streamer / Similar devices) []  - 0 Simple Staple / Suture removal (25 or less) []  - 0 Complex Staple / Suture removal (26 or more) []  - 0 Hypo / Hyperglycemic Management (close monitor of Blood Glucose) []  - 0 Ankle / Brachial Index (ABI) - do not check if billed separately X- 1 5 Vital Signs Has the patient been seen at the hospital within the last three years: Yes Total Score: 55 Level Of Care: New/Established - Level 2 Electronic Signature(s) Signed: 11/13/2020 10:59:38 AM By: Donnamarie Poag Entered By: Donnamarie Poag on 11/13/2020 08:35:05 [name] ([id number]) -------------------------------------------------------------------------------- Encounter Discharge Information Details Patient Name: [name] Date of Service: 11/13/2020 8:00 AM Medical Record Number: [id number] Patient Account Number: [id number] Date of Birth/Sex: [birth year] (39 y.o. M) Treating RN: Carlene Coria Primary Care Britney Newstrom: [name] Other Clinician: Referring Malon Branton: [name] Treating Ames Hoban/Extender: [name] Cliche in Treatment: 205 Encounter Discharge Information Items Discharge Condition: Stable Ambulatory Status: Ambulatory Discharge Destination: Home Transportation: Private Auto Accompanied By: self Schedule Follow-up Appointment:  Yes Clinical Summary of Care: Patient Declined Electronic Signature(s) Signed: 11/14/2020 8:03:47 AM By: Carlene Coria RN Entered By: Carlene Coria on 11/13/2020 08:59:17 [name] ([id number]) -------------------------------------------------------------------------------- Lower Extremity Assessment Details Patient Name: [name] Date of Service: 11/13/2020 8:00 AM Medical Record Number: [id number] Patient Account Number: [id number] Date of Birth/Sex: 1981/02/22 (39 y.o. M) Treating RN: Donnamarie Poag Primary Care Yarelie Hams: [name] Other Clinician: Referring Remigio Mcmillon: [name] Treating Floreen Teegarden/Extender: [name] Cliche in Treatment: 205 Edema Assessment Assessed: [Left: Yes] [name]: No] [Left: Edema] [Right: :] Calf Left: Right: Point of Measurement: 33 cm From Medial Instep 48.5 cm Ankle Left: Right: Point of Measurement: 12 cm From Medial Instep 30.3 cm Knee To Floor Left: Right: From Medial Instep 44 cm Vascular Assessment Pulses: Dorsalis Pedis Palpable: [Left:Yes] Electronic Signature(s) Signed: 11/13/2020 10:59:38 AM By: Donnamarie Poag Entered By: Donnamarie Poag on 11/13/2020 08:45:15 Samek, [name] ([id number]) -------------------------------------------------------------------------------- Multi Wound Chart Details Patient Name: [name] Date of Service: 11/13/2020 8:00 AM Medical Record Number: [id number] Patient Account Number: [id number] Date of Birth/Sex: Oct 05, 1980 (39 y.o. M) Treating RN: Donnamarie Poag Primary Care Armanie Martine: [name] Other Clinician: Referring Shron Ozer: [name] Treating Jonathan Kirkendoll/Extender: [name] Cliche in Treatment: 205 Vital Signs Height(in): 71 Pulse(bpm): 88 Weight(lbs): 338 Blood Pressure(mmHg): 120/79 Body Mass Index(BMI): 47 Temperature(F): 98.6 Respiratory Rate(breaths/min): 16 Photos: [N/A:N/A] Wound Location: Left, Lateral Lower Leg N/A N/A Wounding Event: Gradually Appeared N/A  N/A Primary Etiology: Pyoderma N/A N/A Comorbid History: Sleep Apnea, Hypertension, Colitis N/A N/A Date Acquired: 11/18/2015 N/A N/A Weeks of Treatment: 205 N/A N/A Wound Status: Open N/A N/A Measurements L x W x D (cm) 6.5x7x0.8 N/A N/A Area (cm) : 35.736 N/A N/A Volume (cm) : 28.588 N/A N/A % Reduction in Area: -628.00% N/A N/A % Reduction in Volume: -628.00% N/A N/A Starting Position 1 (o'clock): 3 Ending Position 1 (o'clock): 3 Maximum Distance 1 (cm): 0.3 Undermining: Yes N/A N/A Classification: Full Thickness With Exposed N/A N/A Support Structures Exudate Amount: Large N/A  N/A Exudate Type: Purulent N/A N/A Exudate Color: yellow, brown, green N/A N/A Wound Margin: Epibole N/A N/A Granulation Amount: Medium (34-66%) N/A N/A Granulation Quality: Red, Pink N/A N/A Necrotic Amount: Medium (34-66%) N/A N/A Exposed Structures: Fat Layer (Subcutaneous Tissue): N/A N/A Yes Fascia: No Tendon: No Muscle: No Joint: No Bone: No Epithelialization: Small (1-33%) N/A N/A Treatment Notes Electronic Signature(s) Signed: 11/13/2020 10:59:38 AM By: Donnamarie Poag Entered By: Donnamarie Poag on 11/13/2020 08:32:21 Giovannini, [name] ([id number]) Bethanne Ginger, [name] ([id number]) -------------------------------------------------------------------------------- Multi-Disciplinary Care Plan Details Patient Name: [name] Date of Service: 11/13/2020 8:00 AM Medical Record Number: [id number] Patient Account Number: [id number] Date of Birth/Sex: December 28, 1980 (40 y.o. M) Treating RN: Donnamarie Poag Primary Care Akeylah Hendel: [name] Other Clinician: Referring Acel Natzke: [name] Treating Marymargaret Kirker/Extender: [name] Cliche in Treatment: Garza-Salinas II reviewed with physician Active Inactive Electronic Signature(s) Signed: 11/13/2020 10:59:38 AM By: Donnamarie Poag Entered By: Donnamarie Poag on 11/13/2020 08:31:53 [name]  ([id number]) -------------------------------------------------------------------------------- Pain Assessment Details Patient Name: [name] Date of Service: 11/13/2020 8:00 AM Medical Record Number: [id number] Patient Account Number: [id number] Date of Birth/Sex: [birth year] (39 y.o. M) Treating RN: Donnamarie Poag Primary Care Talmadge Ganas: [name] Other Clinician: Referring Reynoldo Mainer: [name] Treating Janara Klett/Extender: [name] Cliche in Treatment: 205 Active Problems Location of Pain Severity and Description of Pain Patient Has Paino Yes Site Locations Rate the pain. Current Pain Level: 3 Pain Management and Medication Current Pain Management: Electronic Signature(s) Signed: 11/13/2020 10:59:38 AM By: Donnamarie Poag Entered By: Donnamarie Poag on 11/13/2020 08:09:25 [name] ([id number]) -------------------------------------------------------------------------------- Patient/Caregiver Education Details Patient Name: [name] Date of Service: 11/13/2020 8:00 AM Medical Record Number: [id number] Patient Account Number: [id number] Date of Birth/Gender: 09-26-1980 (39 y.o. M) Treating RN: Donnamarie Poag Primary Care Physician: [name] Other Clinician: Referring Physician: [name] Treating Physician/Extender: [name] Cliche in Treatment: [phone] Education Assessment Education Provided To: Patient Education Topics Provided Basic Hygiene: Wound/Skin Impairment: Engineer, maintenance) Signed: 11/13/2020 10:59:38 AM By: Donnamarie Poag Entered By: Donnamarie Poag on 11/13/2020 08:35:30 [name] ([id number]) -------------------------------------------------------------------------------- Wound Assessment Details Patient Name: [name] Date of Service: 11/13/2020 8:00 AM Medical Record Number: [id number] Patient Account Number: [id number] Date of Birth/Sex: 11/07/1980 (39 y.o. M) Treating RN: Donnamarie Poag Primary Care Coty Student: [name] Other  Clinician: Referring Kalman Nylen: [name] Treating Tacara Hadlock/Extender: [name] Cliche in Treatment: 205 Wound Status Wound Number: 1 Primary Etiology: Pyoderma Wound Location: Left, Lateral Lower Leg Wound Status: Open Wounding Event: Gradually Appeared Comorbid History: Sleep Apnea, Hypertension, Colitis Date Acquired: 11/18/2015 Weeks Of Treatment: 205 Clustered Wound: No Photos Wound Measurements Length: (cm) 6.5 Width: (cm) 7.9 Depth: (cm) 0.8 Area: (cm) 40.33 Volume: (cm) 32.264 % Reduction in Area: -721.6% % Reduction in Volume: -721.6% Epithelialization: Small (1-33%) Tunneling: No Undermining: Yes Starting Position (o'clock): 3 Ending Position (o'clock): 3 Maximum Distance: (cm) 0.3 Wound Description Classification: Full Thickness With Exposed Support Structures Wound Margin: Epibole Exudate Amount: Large Exudate Type: Purulent Exudate Color: yellow, brown, green Foul Odor After Cleansing: No Slough/Fibrino Yes Wound Bed Granulation Amount: Medium (34-66%) Exposed Structure Granulation Quality: Red, Pink Fascia Exposed: No Necrotic Amount: Medium (34-66%) Fat Layer (Subcutaneous Tissue) Exposed: Yes Tendon Exposed: No Muscle Exposed: No Joint Exposed: No Bone Exposed: No Treatment Notes Wound #1 (Lower Leg) Wound Laterality: Left, Lateral Cleanser Kackley, Osualdo ([id number]) Soap and Water Discharge Instruction: Gently cleanse wound with antibacterial soap, rinse and pat dry prior to dressing wounds Peri-Wound Care Topical Primary Dressing Foam Dressing, 4x4 (in/in) Secondary Dressing Secured  With Compression Wrap Compression Stockings Add-Ons Electronic Signature(s) Signed: 11/13/2020 10:59:38 AM By: Donnamarie Poag Entered By: Donnamarie Poag on 11/13/2020 08:33:38 Cabriales, [name] ([id number]) -------------------------------------------------------------------------------- Vitals Details Patient Name: [name] Date of Service: 11/13/2020  8:00 AM Medical Record Number: [id number] Patient Account Number: [id number] Date of Birth/Sex: 1981-02-11 (39 y.o. M) Treating RN: Donnamarie Poag Primary Care Dimas Scheck: [name] Other Clinician: Referring Noheli Melder: [name] Treating Shelby Anderle/Extender: [name] Cliche in Treatment: 205 Vital Signs Time Taken: 08:03 Temperature (F): 98.6 Height (in): 71 Pulse (bpm): 88 Weight (lbs): 338 Respiratory Rate (breaths/min): 16 Body Mass Index (BMI): 47.1 Blood Pressure (mmHg): 120/79 Reference Range: 80 - 120 mg / dl Electronic Signature(s) Signed: 11/13/2020 10:59:38 AM By: Donnamarie Poag Entered ByDonnamarie Poag on 11/13/2020 08:09:15

## 2020-11-18 ENCOUNTER — Other Ambulatory Visit: Payer: Self-pay | Admitting: Primary Care

## 2020-11-18 DIAGNOSIS — E119 Type 2 diabetes mellitus without complications: Secondary | ICD-10-CM

## 2020-11-19 NOTE — Telephone Encounter (Signed)
I really need to see him back for diabetes follow up. Can we get him in soon?

## 2020-11-21 NOTE — Telephone Encounter (Signed)
Called patient appointment made for 7/12. Will call if any issues.

## 2020-11-27 ENCOUNTER — Encounter: Payer: 59 | Attending: Physician Assistant | Admitting: Physician Assistant

## 2020-11-27 ENCOUNTER — Other Ambulatory Visit: Payer: Self-pay

## 2020-11-27 DIAGNOSIS — L03116 Cellulitis of left lower limb: Secondary | ICD-10-CM | POA: Diagnosis not present

## 2020-11-27 DIAGNOSIS — L97222 Non-pressure chronic ulcer of left calf with fat layer exposed: Secondary | ICD-10-CM | POA: Insufficient documentation

## 2020-11-27 DIAGNOSIS — E11622 Type 2 diabetes mellitus with other skin ulcer: Secondary | ICD-10-CM | POA: Insufficient documentation

## 2020-11-27 DIAGNOSIS — L97213 Non-pressure chronic ulcer of right calf with necrosis of muscle: Secondary | ICD-10-CM | POA: Insufficient documentation

## 2020-11-27 DIAGNOSIS — L97321 Non-pressure chronic ulcer of left ankle limited to breakdown of skin: Secondary | ICD-10-CM | POA: Diagnosis not present

## 2020-11-27 NOTE — Progress Notes (Signed)
HAVARD, RADIGAN ([id number]) Visit Report for 11/27/2020 Arrival Information Details Patient Name: [name], [name] Date of Service: 11/27/2020 8:15 AM Medical Record Number: [id number] Patient Account Number: [id number] Date of Birth/Sex: June 18, 1980 (40 y.o. M) Treating RN: Dolan Amen Primary Care Gamble Enderle: [name] Other Clinician: Referring Wali Reinheimer: [name] Treating Aalliyah Kilker/Extender: [name] Cliche in Treatment: 207 Visit Information History Since Last Visit Has Dressing in Place as Prescribed: Yes Patient Arrived: Ambulatory Pain Present Now: Yes Arrival Time: 08:35 Accompanied By: self Transfer Assistance: None Patient Identification Verified: Yes Secondary Verification Process Completed: Yes Patient Requires Transmission-Based Precautions: No Patient Has Alerts: Yes Electronic Signature(s) Signed: 11/27/2020 4:08:35 PM By: Dolan Amen RN Entered By: Dolan Amen on 11/27/2020 08:35:33 [name] ([id number]) -------------------------------------------------------------------------------- Clinic Level of Care Assessment Details Patient Name: [name] Date of Service: 11/27/2020 8:15 AM Medical Record Number: [id number] Patient Account Number: [id number] Date of Birth/Sex: 03-26-1981 (40 y.o. M) Treating RN: Dolan Amen Primary Care Elijio Staples: [name] Other Clinician: Referring Yaniris Braddock: [name] Treating Navy Belay/Extender: [name] Cliche in Treatment: 207 Clinic Level of Care Assessment Items TOOL 4 Quantity Score X - Use when only an EandM is performed on FOLLOW-UP visit 1 0 ASSESSMENTS - Nursing Assessment / Reassessment X - Reassessment of Co-morbidities (includes updates in patient status) 1 10 X- 1 5 Reassessment of Adherence to Treatment Plan ASSESSMENTS - Wound and Skin Assessment / Reassessment X - Simple Wound Assessment / Reassessment - one wound 1 5 []  - 0 Complex Wound Assessment / Reassessment - multiple  wounds []  - 0 Dermatologic / Skin Assessment (not related to wound area) ASSESSMENTS - Focused Assessment []  - Circumferential Edema Measurements - multi extremities 0 []  - 0 Nutritional Assessment / Counseling / Intervention []  - 0 Lower Extremity Assessment (monofilament, tuning fork, pulses) []  - 0 Peripheral Arterial Disease Assessment (using hand held doppler) ASSESSMENTS - Ostomy and/or Continence Assessment and Care []  - Incontinence Assessment and Management 0 []  - 0 Ostomy Care Assessment and Management (repouching, etc.) PROCESS - Coordination of Care X - Simple Patient / Family Education for ongoing care 1 15 []  - 0 Complex (extensive) Patient / Family Education for ongoing care []  - 0 Staff obtains Programmer, systems, Records, Test Results / Process Orders []  - 0 Staff telephones HHA, Nursing Homes / Clarify orders / etc []  - 0 Routine Transfer to another Facility (non-emergent condition) []  - 0 Routine Hospital Admission (non-emergent condition) []  - 0 New Admissions / Biomedical engineer / Ordering NPWT, Apligraf, etc. []  - 0 Emergency Hospital Admission (emergent condition) X- 1 10 Simple Discharge Coordination []  - 0 Complex (extensive) Discharge Coordination PROCESS - Special Needs []  - Pediatric / Minor Patient Management 0 []  - 0 Isolation Patient Management []  - 0 Hearing / Language / Visual special needs []  - 0 Assessment of Community assistance (transportation, D/C planning, etc.) []  - 0 Additional assistance / Altered mentation []  - 0 Support Surface(s) Assessment (bed, cushion, seat, etc.) INTERVENTIONS - Wound Cleansing / Measurement Rasool, Linh ([id number]) X- 1 5 Simple Wound Cleansing - one wound []  - 0 Complex Wound Cleansing - multiple wounds X- 1 5 Wound Imaging (photographs - any number of wounds) []  - 0 Wound Tracing (instead of photographs) X- 1 5 Simple Wound Measurement - one wound []  - 0 Complex Wound Measurement - multiple  wounds INTERVENTIONS - Wound Dressings []  - Small Wound Dressing one or multiple wounds 0 X- 1 15 Medium Wound Dressing one or multiple wounds []  - 0  Large Wound Dressing one or multiple wounds []  - 0 Application of Medications - topical []  - 0 Application of Medications - injection INTERVENTIONS - Miscellaneous []  - External ear exam 0 []  - 0 Specimen Collection (cultures, biopsies, blood, body fluids, etc.) []  - 0 Specimen(s) / Culture(s) sent or taken to Lab for analysis []  - 0 Patient Transfer (multiple staff / Civil Service fast streamer / Similar devices) []  - 0 Simple Staple / Suture removal (25 or less) []  - 0 Complex Staple / Suture removal (26 or more) []  - 0 Hypo / Hyperglycemic Management (close monitor of Blood Glucose) []  - 0 Ankle / Brachial Index (ABI) - do not check if billed separately X- 1 5 Vital Signs Has the patient been seen at the hospital within the last three years: Yes Total Score: 80 Level Of Care: New/Established - Level 3 Electronic Signature(s) Signed: 11/27/2020 4:08:35 PM By: Dolan Amen RN Entered By: Dolan Amen on 11/27/2020 08:56:58 [name] ([id number]) -------------------------------------------------------------------------------- Lower Extremity Assessment Details Patient Name: [name] Date of Service: 11/27/2020 8:15 AM Medical Record Number: [id number] Patient Account Number: [id number] Date of Birth/Sex: February 20, 1981 (40 y.o. M) Treating RN: Dolan Amen Primary Care Hayato Guaman: [name] Other Clinician: Referring Harun Brumley: [name] Treating Mazella Deen/Extender: Jeri Cos Weeks in Treatment: 207 Edema Assessment Assessed: [Left: Yes] [Right: No] Edema: [Left: N] [Right: o] Calf Left: Right: Point of Measurement: 33 cm From Medial Instep 49 cm Ankle Left: Right: Point of Measurement: 12 cm From Medial Instep 30 cm Vascular Assessment Pulses: Dorsalis Pedis Palpable: [Left:Yes] Electronic  Signature(s) Signed: 11/27/2020 4:08:35 PM By: Dolan Amen RN Entered By: Dolan Amen on 11/27/2020 08:45:19 [name] ([id number]) -------------------------------------------------------------------------------- Multi Wound Chart Details Patient Name: [name] Date of Service: 11/27/2020 8:15 AM Medical Record Number: [id number] Patient Account Number: [id number] Date of Birth/Sex: [birth year] (39 y.o. M) Treating RN: Dolan Amen Primary Care Trina Asch: [name] Other Clinician: Referring Electra Paladino: [name] Treating Kemani Heidel/Extender: [name] Cliche in Treatment: 207 Vital Signs Height(in): 71 Pulse(bpm): 81 Weight(lbs): 338 Blood Pressure(mmHg): 149/87 Body Mass Index(BMI): 47 Temperature(F): 98.5 Respiratory Rate(breaths/min): 18 Photos: [N/A:N/A] Wound Location: Left, Lateral Lower Leg N/A N/A Wounding Event: Gradually Appeared N/A N/A Primary Etiology: Pyoderma N/A N/A Comorbid History: Sleep Apnea, Hypertension, Colitis N/A N/A Date Acquired: 11/18/2015 N/A N/A Weeks of Treatment: 207 N/A N/A Wound Status: Open N/A N/A Measurements L x W x D (cm) 7.5x7.2x1.2 N/A N/A Area (cm) : 42.412 N/A N/A Volume (cm) : 50.894 N/A N/A % Reduction in Area: -764.00% N/A N/A % Reduction in Volume: -1196.00% N/A N/A Classification: Full Thickness With Exposed N/A N/A Support Structures Exudate Amount: Large N/A N/A Exudate Type: Purulent N/A N/A Exudate Color: yellow, brown, green N/A N/A Wound Margin: Epibole N/A N/A Granulation Amount: Medium (34-66%) N/A N/A Granulation Quality: Red, Pink N/A N/A Necrotic Amount: Medium (34-66%) N/A N/A Exposed Structures: Fat Layer (Subcutaneous Tissue): N/A N/A Yes Muscle: Yes Fascia: No Tendon: No Joint: No Bone: No Epithelialization: Small (1-33%) N/A N/A Treatment Notes Electronic Signature(s) Signed: 11/27/2020 4:08:35 PM By: Dolan Amen RN Entered By: Dolan Amen on 11/27/2020  08:52:42 [name] ([id number]) -------------------------------------------------------------------------------- Multi-Disciplinary Care Plan Details Patient Name: [name] Date of Service: 11/27/2020 8:15 AM Medical Record Number: [id number] Patient Account Number: [id number] Date of Birth/Sex: 09/25/1980 (40 y.o. M) Treating RN: Dolan Amen Primary Care Mistee Soliman: [name] Other Clinician: Referring Sunshyne Horvath: [name] Treating Raistlin Gum/Extender: [name] Cliche in Treatment: 207 oo Multidisciplinary Care Plan reviewed  with physician Active Inactive Electronic Signature(s) Signed: 11/27/2020 4:08:35 PM By: Dolan Amen RN Entered By: Dolan Amen on 11/27/2020 08:52:30 RUDRA, HOBBINS ([id number]) -------------------------------------------------------------------------------- Pain Assessment Details Patient Name: [name] Date of Service: 11/27/2020 8:15 AM Medical Record Number: [id number] Patient Account Number: [id number] Date of Birth/Sex: 03-May-1981 (39 y.o. M) Treating RN: Dolan Amen Primary Care Taneeka Curtner: [name] Other Clinician: Referring Riordan Walle: [name] Treating Malosi Hemstreet/Extender: [name] Cliche in Treatment: 207 Active Problems Location of Pain Severity and Description of Pain Patient Has Paino Yes Site Locations Pain Location: Pain in Ulcers Rate the pain. Current Pain Level: 2 Pain Management and Medication Current Pain Management: Electronic Signature(s) Signed: 11/27/2020 4:08:35 PM By: Dolan Amen RN Entered By: Dolan Amen on 11/27/2020 08:37:43 [name] ([id number]) -------------------------------------------------------------------------------- Patient/Caregiver Education Details Patient Name: [name] Date of Service: 11/27/2020 8:15 AM Medical Record Number: [id number] Patient Account Number: [id number] Date of Birth/Gender: February 19, 1981 (39 y.o. M) Treating RN: Dolan Amen Primary Care Physician: [name] Other Clinician: Referring Physician: [name] Treating Physician/Extender: [name] Cliche in Treatment: 207 Education Assessment Education Provided To: Patient Education Topics Provided Wound/Skin Impairment: Methods: Explain/Verbal Responses: State content correctly Electronic Signature(s) Signed: 11/27/2020 4:08:35 PM By: Dolan Amen RN Entered By: Dolan Amen on 11/27/2020 08:57:23 [name] ([id number]) -------------------------------------------------------------------------------- Wound Assessment Details Patient Name: [name] Date of Service: 11/27/2020 8:15 AM Medical Record Number: [id number] Patient Account Number: [id number] Date of Birth/Sex: 19-May-1981 (39 y.o. M) Treating RN: Dolan Amen Primary Care Fatin Bachicha: [name] Other Clinician: Referring Keniel Ralston: [name] Treating Mikenzi Raysor/Extender: [name] Cliche in Treatment: 207 Wound Status Wound Number: 1 Primary Etiology: Pyoderma Wound Location: Left, Lateral Lower Leg Wound Status: Open Wounding Event: Gradually Appeared Comorbid History: Sleep Apnea, Hypertension, Colitis Date Acquired: 11/18/2015 Weeks Of Treatment: 207 Clustered Wound: No Photos Wound Measurements Length: (cm) 7.5 Width: (cm) 7.2 Depth: (cm) 1.2 Area: (cm) 42.412 Volume: (cm) 50.894 % Reduction in Area: -764% % Reduction in Volume: -1196% Epithelialization: Small (1-33%) Tunneling: No Undermining: No Wound Description Classification: Full Thickness With Exposed Support Structures Wound Margin: Epibole Exudate Amount: Large Exudate Type: Purulent Exudate Color: yellow, brown, green Foul Odor After Cleansing: No Slough/Fibrino Yes Wound Bed Granulation Amount: Medium (34-66%) Exposed Structure Granulation Quality: Red, Pink Fascia Exposed: No Necrotic Amount: Medium (34-66%) Fat Layer (Subcutaneous Tissue) Exposed: Yes Necrotic  Quality: Adherent Slough Tendon Exposed: No Muscle Exposed: Yes Necrosis of Muscle: No Joint Exposed: No Bone Exposed: No Electronic Signature(s) Signed: 11/27/2020 4:08:35 PM By: Dolan Amen RN Entered By: Dolan Amen on 11/27/2020 08:43:55 [name] ([id number]) -------------------------------------------------------------------------------- [place] Details Patient Name: [name] Date of Service: 11/27/2020 8:15 AM Medical Record Number: [id number] Patient Account Number: [id number] Date of Birth/Sex: 09-15-1980 (40 y.o. M) Treating RN: Dolan Amen Primary Care Meilech Virts: [name] Other Clinician: Referring Dawood Spitler: [name] Treating Cougar Imel/Extender: [name] Cliche in Treatment: 207 Vital Signs Time Taken: 08:35 Temperature (F): 98.5 Height (in): 71 Pulse (bpm): 81 Weight (lbs): 338 Respiratory Rate (breaths/min): 18 Body Mass Index (BMI): 47.1 Blood Pressure (mmHg): 149/87 Reference Range: 80 - 120 mg / dl Electronic Signature(s) Signed: 11/27/2020 4:08:35 PM By: Dolan Amen RN Entered By: Dolan Amen on 11/27/2020 08:37:15

## 2020-11-27 NOTE — Progress Notes (Addendum)
[name] ([id number]) Visit Report for 11/27/2020 Chief Complaint Document Details Patient Name: [name] Date of Service: 11/27/2020 8:15 AM Medical Record Number: [id number] Patient Account Number: [id number] Date of Birth/Sex: March 27, 1981 (40 y.o. 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 M) Treating RN: Donnamarie Poag Primary Care Provider: [name] Other Clinician: Referring Provider: [name] Treating Provider/Extender: [name] Cliche in Treatment: 207 History of Present Illness HPI Description: 12/04/16; 40 year old man who comes into the clinic today for review of a wound on the posterior left calf. He tells me that is been there for about a year. He is not a diabetic he does smoke half a pack per day. He was seen in the ER on 11/20/16 felt to have cellulitis around the wound and was given clindamycin. An x-ray did not show osteomyelitis. The patient initially tells me that he has a milk allergy that sets off a pruritic itching rash on his lower legs which she scratches incessantly and he thinks that's what may have set up the wound. He has been using various topical antibiotics and ointments without any effect. He works in a [employer] and is on his feet all day. He does not have a prior history  of wounds however he does have the rash on both lower legs the right arm and the ventral aspect of his left arm. These are excoriations and clearly have had scratching however there are of macular looking areas on both legs including a substantial larger area on the right leg. This does not have an underlying open area. There is no blistering. The patient tells me that 2 years ago in [place] in response to the rash on his legs he saw a dermatologist who told him he had a condition which may be pyoderma gangrenosum although I may be putting words into his mouth. He seemed to recognize this. 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 He is not in any treatment for this. He's had no recent travel 12/11/16; the patient arrives today with his wound and roughly the same condition we've been using silver alginate this is a deep punched out wound with some surrounding erythema but no tenderness. Biopsy I did did not show confirmed pyoderma gangrenosum suggested nonspecific inflammation and vasculitis but does not provide an actual description of what was seen by the pathologist. I'm really not able to understand this We have also received information from the patient's dermatologist in [place] notes from April 2016. This was a doctor Agarwal-antal. 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 He was prescribed topical steroid high potency under occlusion which helped but at this point the patient did not have a deep punched out wound. 12/18/16; the patient's wound is larger in terms of surface area however this surface looks better and there is less depth. The surrounding erythema also is better. The patient states that the wrap we put on came off 2 days ago when he has been using his compression stockings.  He we are in the process of getting a dermatology consult. 12/26/16 on evaluation today patient's left lower extremity wound shows evidence of infection with  surrounding erythema noted. He has been tolerating the dressing changes but states that he has noted more discomfort. There is a larger area of erythema surrounding the wound. No fevers, chills, nausea, or vomiting noted at this time. With that being said the wound still does have slough covering the surface. He is not allergic to any medication that he is aware of at this point. In regard to his right lower extremity he had several regions that are erythematous and pruritic he wonders if there's anything we can do to help that. 01/02/17 I reviewed patient's wound culture which was obtained his visit last week. He was placed on doxycycline at that point. Unfortunately that does not appear to be an antibiotic that would likely help with the situation however the pseudomonas noted on culture is sensitive to Cipro. Also unfortunately patient's wound seems to have a large compared to last week's evaluation. Not severely so but there are definitely increased measurements in general. He is continuing to have discomfort as well he writes this to be a seven out of 10. In fact he would prefer me not to perform any debridement today due to the fact that he is having discomfort and considering he has an active infection on the little reluctant to do so anyway. No fevers, chills, nausea, or vomiting noted at this time. 01/08/17; patient seems dermatology on September 5. I suspect dermatology will want the slides from the biopsy I did sent to their pathologist. I'm not sure if there is a way we can expedite that. In any case the culture I did before I left on vacation 3 weeks ago showed Pseudomonas he was given 10 days of Cipro and per her description of her intake nurses is actually somewhat better this week although the wound is quite a bit bigger than I remember the last time I saw this. He still has 3 more days of Cipro 01/21/17; dermatology appointment tomorrow. He has completed the ciprofloxacin for Pseudomonas.  Surface of the wound looks better however he is had some deterioration in the lesions on his right leg. Meantime the left lateral leg wound we will continue with sample 01/29/17; patient had his dermatology appointment but I can't yet see that note. He is completed his antibiotics. The wound is more superficial but considerably larger in circumferential area than when he came in. This is in his left lateral calf. He also has swollen erythematous areas with superficial wounds on the right leg and small papular areas on both arms. There apparently areas in her his upper thighs and buttocks I did not look at those. Dermatology biopsied the right leg. Hopefully will have their input next week. 02/05/17; patient went back to see his dermatologist who told him that he had a "scratching problem" as well as staph. He is now on a 30 day course of doxycycline and I believe she gave him triamcinolone cream to the right leg areas to help with the itching [not exactly sure but probably triamcinolone]. She apparently looked at the left lateral leg wound although this was not rebiopsied and I think felt to be ultimately part of the same pathogenesis. He is using sample border foam and changing nevus himself. He now has a new open area on the right posterior leg which was his biopsy site I don't have any of the dermatology notes  02/12/17; we put the patient in compression last week with SANTYL to the wound on the left leg and the biopsy. Edema is much better and the depth of the wound is now at level of skin. Area is still the same oBiopsy site on the right lateral leg we've also been using santyl with a border foam dressing and he is changing this himself. 02/19/17; Using silver alginate started last week to both the substantial left leg wound and the biopsy site on the right wound. He is tolerating compression well. Has a an appointment with his primary M.D. tomorrow wondering about diuretics although I'm wondering if  the edema problem is actually lymphedema 02/26/17; the patient has been to see his primary doctor Dr. Jerrel Ivory at [facility] our primary care. She started him on Lasix 20 mg and this seems to have helped with the edema. However we are not making substantial change with the left lateral calf wound and inflammation. The biopsy site on the right leg also looks stable but not really all that different. 03/12/17; the patient has been to see vein and vascular Dr. Lucky Cowboy. He has had venous reflux studies I have not reviewed these. I did get a call from his dermatology office. They felt that he might have pathergy based on their biopsy on his right leg which led them to look at the slides of ARTH, NICASTRO ([id number]) the biopsy I did on the left leg and they wonder whether this represents pyoderma gangrenosum which was the original supposition in a man with ulcerative colitis albeit inactive for many years. They therefore recommended clobetasol and tetracycline i.e. 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 03/26/17; apparently the patient just had reflux studies not an appointment with Dr. dew. She arrives in clinic today having applied clobetasol for 2-3 weeks. He notes over the last 2-3 days excessive drainage having to change the dressing 3-4 times a day and also expanding erythema. 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 Lillia Carmel at [facility] dermatology locally who kindly saw him at my request. 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 My concern was the degree of erythema medially extending from his left leg wound which was either inflammation from pyoderma or cellulitis. I  put him on Augmentin however culture of the wound showed Pseudomonas which is quinolone sensitive. I really don't believe he has cellulitis however in view of everything I will continue and give him a course of Cipro. He is also on doxycycline as an immune modulator for the pyoderma. In addition to his original wound on the left lateral leg with surrounding erythema he has a wound on the right posterior calf which was an original biopsy site done by dermatology. 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 Saw Dr. Lillia Carmel yesterday. He has been using topical antibiotics to both wound areas his original wound on the left and the biopsies/pathergy area on the right. There is definitely some improvement in the inflammation around the wound on the right although the patient states he has increasing sensitivity of the wounds. He is on prednisone 60 and doxycycline 1 as prescribed by Dr. Lillia Carmel. He is covering the topical antibiotic with gauze  and putting this in his own compression stocks and changing this daily. He states that Dr. Lottie Rater did a culture of the left leg wound yesterday 05/07/17; pyoderma gangrenosum. The patient saw Dr. Lillia Carmel yesterday and has a follow-up with her in one month. He is still using topical antibiotics to both wounds although he can't recall exactly what type. He is still on prednisone 60 mg. Dr. Lillia Carmel stated that the doxycycline could stop if we were in agreement. 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 The right medial leg was induced by biopsy/pathergy. The area on the right is essentially healed. 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 The right medial leg has closed and remains closed. He is still on  prednisone 60. oHe tells me he missed his last dermatology appointment with Dr. Lillia Carmel but will make another appointment. He reports that her blood sugar at a recent screen in [place] was high 200's. He was 180 today. 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 I think he is going to require still much longer prednisone perhaps another 3 months before attempting to taper. In the meantime his wound is a lot better. Smaller. He is cleaning this off daily and applying topical antibiotics. When he was last in the clinic I thought about changing to [facility] and actually put in a couple of calls to dermatology although probably not during their business hours. 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 He continues on oral prednisone. He has been using triple antibiotic ointment. There is surface debris and we will transition to [place] and have him return in 2 weeks. He has lost 30 pounds since his last appointment with lifestyle modification. He may benefit from topical steroid cream for treatment this can be considered at a later date. 08/22/17 on evaluation today patient appears to actually be doing rather well in regard to his left lateral lower extremity ulcer. He has actually been managed by Dr. Dellia Nims most recently. Patient is currently on oral steroids at this time. This seems to have been of benefit for him. Nonetheless his last visit was actually with Leah on 08/06/17. Currently he is not utilizing any topical steroid creams although this could be of benefit as well. No fevers, chills, nausea, or vomiting noted at this time. 09/05/17 on evaluation today patient appears to be doing better in regard to his left lateral lower extremity ulcer. He has been tolerating the dressing changes without complication. He is using Santyl with good effect. Overall I'm very  pleased with how things are standing at this point. Patient likewise is happy that this is doing better. 09/19/17 on evaluation today patient actually appears to be doing rather well in regard to his left lateral lower extremity ulcer. 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 He's not having any significant pain. 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 He has been tolerating the Santyl without complication and in general I feel like he's making good progress. 10/17/17 on evaluation today patient  appears to be doing very well in regard to his left lateral lower surety ulcer. He has been tolerating the dressing changes without complication. There does not appear to be any evidence of infection he's alternating the Santyl and the triple antibiotic ointment every other day this seems to be doing well for him. 11/03/17 on evaluation today patient appears to be doing very well in regard to his left lateral lower extremity ulcer. He is been tolerating the dressing changes without complication which is good news. Fortunately there does not appear to be any evidence of infection which is also great news. Overall is doing excellent they are starting to taper down on the prednisone is down to 40 mg at this point it also started topical clobetasol for him. 11/17/17 on evaluation today patient appears to be doing well in regard to his left lateral lower surety ulcer. He's been tolerating the dressing changes without complication. He does note that he is having no pain, no excessive drainage or discharge, and overall he feels like things are going about how he would expect and hope they would. Overall he seems to have no evidence of infection at this time in my opinion which is good news. 12/04/17-He is seen in follow-up evaluation  for right lateral lower extremity ulcer. He has been applying topical steroid cream. Today's measurement show slight increase in size. Over the next 2 weeks we will transition to every other day Santyl and steroid cream. He has been encouraged to monitor for changes and notify clinic with any concerns 12/15/17 on evaluation today patient's left lateral motion the ulcer and fortunately is doing worse again at this point. This just since last week to this week has close to doubled in size according to the patient. I did not seeing last week's I do not have a visual to compare this to in our system was also down so we do not have all the charts and at this point. Nonetheless it does have me somewhat concerned in regard to the fact that again he was worried enough about it he has contact the dermatology that placed them back on the full strength, 50 mg a day of the prednisone that he was taken previous. He continues to alternate using clobetasol along with Santyl at this point. He is obviously somewhat frustrated. 12/22/17 on evaluation today patient appears to be doing a little worse compared to last evaluation. Unfortunately the wound is a little deeper and slightly larger than the last week's evaluation. With that being said he has made some progress in regard to the irritation surrounding at this time unfortunately despite that progress that's been made he still has a significant issue going on here. 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 ANDREU, DRUDGE ([id number]) For that reason I discussed with him today the possibility of perform a wound culture to ensure there's nothing overtly infected. 01/06/18 on evaluation today patient's wound is larger and deeper than previously evaluated. With that being said it did appear that his wound was infected after  my last evaluation with him. Subsequently I did end up prescribing a prescription for Bactrim DS which she has been taking and having no complication with. Fortunately there does not appear to be any evidence of infection at this point in time as far as anything spreading, no want to touch, and overall I feel like  things are showing signs of improvement. 01/13/18 on evaluation today patient appears to be even a little larger and deeper than last time. There still muscle exposed in the base of the wound. Nonetheless he does appear to be less erythematous I do believe inflammation is calming down also believe the infection looks like it's probably resolved at this time based on what I'm seeing. No fevers, chills, nausea, or vomiting noted at this time. 01/30/18 on evaluation today patient actually appears to visually look better for the most part. Unfortunately those visually this looks better he does seem to potentially have what may be an abscess in the muscle that has been noted in the central portion of the wound. This is the first time that I have noted what appears to be fluctuance in the central portion of the muscle. With that being said I'm somewhat more concerned about the fact that this might indicate an abscess formation at this location. I do believe that an ultrasound would be appropriate. This is likely something we need to try to do as soon as possible. He has been switch to mupirocin ointment and he is no longer using the steroid ointment as prescribed by dermatology he sees them again next week he's been decreased from 60 to 40 mg of prednisone. 03/09/18 on evaluation today patient actually appears to be doing a little better compared to last time I saw him. There's not as much erythema surrounding the wound itself. He I did review his most recent infectious disease note which was dated 02/24/18. He saw Dr. Michel Bickers in [place]. 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 Patient is now off of antibiotics and they are continually observing this. There seems to be no change in the past two weeks in my pinion based on what the patient says and what I see today compared to what Dr. Megan Salon likely saw two weeks ago. No fevers, chills, nausea, or vomiting noted at this time. 03/23/18 on evaluation today patient's wound actually appears to be showing signs of improvement which is good news. He is currently still on the Dapsone. He is also working on tapering the prednisone to get off of this and Dr. Lottie Rater is working with him in this regard. Nonetheless overall I feel like the wound is doing well it does appear based on the infectious disease note that I reviewed from Dr. 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 I did also review the results of his ultrasound of left lower extremity which revealed there was a dentist tissue in the base of the wound without an abscess noted. 04/06/18 on evaluation today the patient's left lateral lower extremity ulcer actually appears to be doing fairly well which is excellent news. There does not appear to be any evidence of infection at this time which is also great news. Overall he still does have a significantly large ulceration although little by little he seems to be making progress. He is down to 10 mg a day of the prednisone. 04/20/18 on evaluation today patient actually appears to be doing excellent at this time in regard to his left lower extremity ulcer. He's making signs of good progress unfortunately this is taking much longer than we would really like to see but nonetheless he is  making progress. Fortunately there does not appear to be any evidence of infection at this time. No fevers, chills, nausea, or vomiting noted at this time.  The patient has not been using the Santyl due to the cost he hadn't got in this field yet. He's mainly been using the antibiotic ointment topically. Subsequently he also tells me that he really has not been scrubbing in the shower I think this would be helpful again as I told him it doesn't have to be anything too aggressive to even make it believe just enough to keep it free of some of the loose slough and biofilm on the wound surface. 05/11/18 on evaluation today patient's wound appears to be making slow but sure progress in regard to the left lateral lower extremity ulcer. He is been tolerating the dressing changes without complication. Fortunately there does not appear to be any evidence of infection at this time. He is still just using triple antibiotic ointment along with clobetasol occasionally over the area. He never got the Santyl and really does not seem to intend to in my pinion. 06/01/18 on evaluation today patient appears to be doing a little better in regard to his left lateral lower extremity ulcer. He states that overall he does not feel like he is doing as well with the Dapsone as he did with the prednisone. Nonetheless he sees his dermatologist later today and is gonna talk to them about the possibility of going back on the prednisone. Overall again I believe that the wound would be better if you would utilize Santyl but he really does not seem to be interested in going back to the [place] at this point. He has been using triple antibiotic ointment. 06/15/18 on evaluation today patient's wound actually appears to be doing about the same at this point. Fortunately there is no signs of infection at this time. He has made slight improvements although he continues to not really want to clean the wound bed at this point. He states that he just doesn't mess with it he doesn't want to cause any problems with everything else he has going on. He has been on medication, antibiotics as prescribed  by his dermatologist, for a staff infection of his lower extremities which is really drying out now and looking much better he tells me. Fortunately there is no sign of overall infection. 06/29/18 on evaluation today patient appears to be doing well in regard to his left lateral lower surety ulcer all things considering. Fortunately his staff infection seems to be greatly improved compared to previous. He has no signs of infection and this is drying up quite nicely. He is still the doxycycline for this is no longer on cental, Dapsone, or any of the other medications. His dermatologist has recommended possibility of an infusion but right now he does not want to proceed with that. 07/13/18 on evaluation today patient appears to be doing about the same in regard to his left lateral lower surety ulcer. Fortunately there's no signs of infection at this time which is great news. Unfortunately he still builds up a significant amount of Slough/biofilm of the surface of the wound he still is not really cleaning this as he should be appropriately. Again I'm able to easily with saline and gauze remove the majority of this on the surface which if you would do this at home would likely be a dramatic improvement for him as far as getting the area to improve. Nonetheless overall I still feel like he  is making progress is just very slow. I think Santyl will be of benefit for him as well. Still he has not gotten this as of this point. 07/27/18 on evaluation today patient actually appears to be doing little worse in regards of the erythema around the periwound region of the wound he also tells me that he's been having more drainage currently compared to what he was experiencing last time I saw him. He states not quite as bad as what he had because this was infected previously but nonetheless is still appears to be doing poorly. Fortunately there is no evidence of systemic infection at this point. The patient tells me that  he is not going to be able to afford the Santyl. He is still waiting to hear about the infusion therapy with his dermatologist. Apparently she wants an updated colonoscopy first. 08/10/18 on evaluation today patient appears to be doing better in regard to his left lateral lower extremity ulcer. Fortunately he is showing signs of improvement in this regard he's actually been approved for Remicade infusion's as well although this has not been scheduled as of yet. Fortunately there's no signs of active infection at this time in regard to the wound although he is having some issues with infection of the right lower extremity is been seen as dermatologist for this. Fortunately they are definitely still working with him trying to keep things under control. ROCZEN, WAYMIRE ([id number]) 09/07/18 on evaluation today patient is actually doing rather well in regard to his left lateral lower extremity ulcer. He notes these actually having some hair grow back on his extremity which is something he has not seen in years. He also tells me that the pain is really not giving them any trouble at this time which is also good news overall she is very pleased with the progress he's using a combination of the mupirocin along with the probate is all mixed. 09/21/18 on evaluation today patient actually appears to be doing fairly well all things considered in regard to his looks from the ulcer. He's been tolerating the dressing changes without complication. Fortunately there's no signs of active infection at this time which is good news he is still on all antibiotics or prevention of the staff infection. He has been on prednisone for time although he states it is gonna contact his dermatologist and see if she put them on a short course due to some irritation that he has going on currently. Fortunately there's no evidence of any overall worsening this is going very slow I think cental would be something that would be helpful for him  although he states that $50 for tube is quite expensive. He therefore is not willing to get that at this point. 10/06/18 on evaluation today patient actually appears to be doing decently well in regard to his left lateral leg ulcer. He's been tolerating the dressing changes without complication. Fortunately there's no signs of active infection at this time. Overall I'm actually rather pleased with the progress he's making although it's slow he doesn't show any signs of infection and he does seem to be making some improvement. I do believe that he may need a switch up and dressings to try to help this to heal more appropriately and quickly. 10/19/18 on evaluation today patient actually appears to be doing better in regard to his left lateral lower extremity ulcer. This is shown signs of having much less Slough buildup at this point due to the fact he has been using  the Santyl. Obviously this is very good news. The overall size of the wound is not dramatically smaller but again the appearance is. 11/02/18 on evaluation today patient actually appears to be doing quite well in regard to his lower [place] ulcer. A lot of the skin around the ulcer is actually somewhat irritating at this point this seems to be more due to the dressing causing irritation from the adhesive that anything else. Fortunately there is no signs of active infection at this time. 11/24/18 on evaluation today patient appears to be doing a little worse in regard to his overall appearance of his lower extremity ulcer. There's more erythema and warmth around the wound unfortunately. He is currently on doxycycline which he has been on for some time. With that being said I'm not sure that seems to be helping with what appears to possibly be an acute cellulitis with regard to his left lower extremity ulcer. No fevers, chills, nausea, or vomiting noted at this time. 12/08/18 on evaluation today patient's wounds actually appears to be doing  significantly better compared to his last evaluation. He has been using Santyl along with alternating tripling about appointment as well as the steroid cream seems to be doing quite well and the wound is showing signs of improvement which is excellent news. Fortunately there's no evidence of infection and in fact his culture came back negative with only normal skin flora noted. 12/21/2018 upon evaluation today patient actually appears to be doing excellent with regard to his ulcer. This is actually the best that I have seen it since have been helping to take care of him. It is both smaller as well as less slough noted on the surface of the wound and seems to be showing signs of good improvement with new skin growing from the edges. He has been using just the triamcinolone he does wonder if he can get a refill of that ointment today. 01/04/2019 upon evaluation today patient actually appears to be doing well with regard to his left lateral lower extremity ulcer. With that being said it does not appear to be that he is doing quite as well as last time as far as progression is concerned. There does not appear to be any signs of infection or significant irritation which is good news. With that being said I do believe that he may benefit from switching to a collagen based dressing based on how clean The wound appears. 01/18/2019 on evaluation today patient actually appears to be doing well with regard to his wound on the left lower extremity. He is not made a lot of progress compared to where we were previous but nonetheless does seem to be doing okay at this time which is good news. There is no signs of active infection which is also good news. My only concern currently is I do wish we can get him into utilizing the collagen dressing his insurance would not pay for the supplies that we ordered although it appears that he may be able to order this through his supply company that he typically utilizes. This is  [employer]. Nonetheless he did try to order it during the office visit today and it appears this did go through. We will see if he can get that it is a different brand but nonetheless he has collagen and I do think will be beneficial. 02/01/2019 on evaluation today patient actually appears to be doing a little worse today in regard to the overall size of his wounds. Fortunately there  is no signs of active infection at this time. That is visually. Nonetheless when this is happened before it was due to infection. For that reason were somewhat concerned about that this time as well. 02/08/2019 on evaluation today patient unfortunately appears to be doing slightly worse with regard to his wound upon evaluation today. Is measuring a little deeper and a little larger unfortunately. I am not really sure exactly what is causing this to enlarge he actually did see his dermatologist she is going to see about initiating Humira for him. Subsequently she also did do steroid injections into the wound itself in the periphery. Nonetheless still nonetheless he seems to be getting a little bit larger he is gone back to just using the steroid cream topically which I think is appropriate. I would say hold off on the collagen for the time being is definitely a good thing to do. Based on the culture results which we finally did get the final result back regarding it shows staph as the bacteria noted again that can be a normal skin bacteria based on the fact however he is having increased drainage and worsening of the wound measurement wise I would go ahead and place him on an antibiotic today I do believe for this. 02/15/2019 on evaluation today patient actually appears to be doing somewhat better in regard to his ulcer. There is no signs of worsening at this time I did review his culture results which showed evidence of Staphylococcus aureus but not MRSA. Again this could just be more related to the normal skin bacteria  although he states the drainage has slowed down quite a bit he may have had a mild infection not just colonization. And was much smaller and then since around10/04/2019 on evaluation today patient appears to be doing unfortunately worse as far as the size of the wound. I really feel like that this is steadily getting larger again it had been doing excellent right at the beginning of September we have seen a steady increase in the area of the wound it is almost 2-1/2 times the size it was on September 1. Obviously this is a bad trend this is not wanting to see. For that reason we went back to using just the topical triamcinolone cream which does seem to help with inflammation. I checked him for bacteria by way of culture and nothing showed positive there. I am considering giving him a short course of a tapering steroid Dosepak today to see if that is can be beneficial for him. The patient is in agreement with giving that a try. 03/08/2019 on evaluation today patient appears to be doing very well in comparison to last evaluation with regard to his lower extremity ulcer. This is showing signs of less inflammation and actually measuring slightly smaller compared to last time every other week over the past month and a half he has been measuring larger larger larger. Nonetheless I do believe that the issue has been inflammation the prednisone does seem to [facility], Jazper ([id number]) have been beneficial for him which is good news. No fevers, chills, nausea, vomiting, or diarrhea. 03/22/2019 on evaluation today patient appears to be doing about the same with regard to his leg ulcer. He has been tolerating the dressing changes without complication. With that being said the wound seems to be mostly arrested at its current size but really is not making any progress except for when we prescribed the prednisone. He did show some signs of dropping as far as  the overall size of the wound during that interval week.  Nonetheless this is something he is not on long-term at this point and unfortunately I think he is getting need either this or else the Humira which his dermatologist has discussed try to get approval for. With that being said he will be seeing his dermatologist on the 11th of this month that is November. 04/19/2019 on evaluation today patient appears to be doing really about the same the wound is measuring slightly larger compared to last time I saw him. He has not been into the office since November 2 due to the fact that he unfortunately had Covid as that his entire family. He tells me that it was rough but they did pull-through and he seems to be doing much better. Fortunately there is no signs of active infection at this time. No fevers, chills, nausea, vomiting, or diarrhea. 05/10/2019 on evaluation today patient unfortunately appears to be doing significantly worse as compared to last time I saw him. He does tell me that he has had his first dose of Humira and actually is scheduled to get the next one in the upcoming week. With that being said he tells me also that in the past several days he has been having a lot of issues with green drainage she showed me a picture this is more blue-green in color. He is also been having issues with increased sloughy buildup and the wound does appear to be larger today. Obviously this is not the direction that we want everything to take based on the starting of his Humira. Nonetheless I think this is definitely a result of likely infection and to be honest I think this is probably Pseudomonas causing the infection based on what I am seeing. 05/24/2019 on evaluation today patient unfortunately appears to be doing significantly worse compared to his prior evaluation with me 2 weeks ago. I did review his culture results which showed that he does have Staph aureus as well as Pseudomonas noted on the culture. Nonetheless the Levaquin that I prescribed for him does  not appear to have been appropriate and in fact he tells me he is no longer experiencing the green drainage and discharge that he had at the last visit. Fortunately there is no signs of active infection at this time which is good news although the wound has significantly worsened it in fact is much deeper than it was previous. We have been utilizing up to this point triamcinolone ointment as the prescription topical of choice but at this time I really feel like that the wound is getting need to be packed in order to appropriately manage this due to the deeper nature of the wound. Therefore something along the lines of an alginate dressing may be more appropriate. 05/31/2019 upon inspection today patient's wound actually showed signs of doing poorly at this point. Unfortunately he just does not seem to be making any good progress despite what we have tried. He actually did go ahead and pick up the Cipro and start taking that as he was noticing more green drainage he had previously completed the Levaquin that I prescribed for him as well. Nonetheless he missed his appointment for the seventh last week on Wednesday with the wound care center and [facility] where his dermatologist referred him. Obviously I do think a second opinion would be helpful at this point especially in light of the fact that the patient seems to be doing so poorly despite the fact  that we have tried everything that I really know how at this point. The only thing that ever seems to have helped him in the past is when he was on high doses of continual steroids that did seem to make a difference for him. 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 06/07/2019 upon evaluation today patient unfortunately appears to be doing worse yet again with regard to his wound. In fact I am  starting to question whether or not he may have a fluid pocket in the muscle at this point based on the bulging and the soft appearance to the central portion of the muscle area. There is not anything draining from the muscle itself at this time which is good news but nonetheless the wound is expanding. I am not really seeing any results of the Humira as far as overall wound progression based on what I am seeing at this point. The patient has been referred for second opinion with regard to his wound to the [facility] wound care center by his dermatologist which I definitely am not in opposition to. Unfortunately we tried multiple dressings in the past including collagen, alginate, and at one point even Hydrofera Blue. With that being said he is never really used it for any significant amount of time due to the fact that he often complains of pain associated with these dressings and then will go back to either using the Santyl which she has done intermittently or more frequently the triamcinolone. He is also using his own compression stockings. We have wrapped him in the past but again that was something else that he really was not a big fan of. 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 He seemed to do much better when he was on the steroids although honestly I understand that the steroids are not necessarily the best medication to be on long-term obviously 06/14/2019 on evaluation today patient appears to be doing actually a little bit better with regard to the overall appearance with his leg. Unfortunately he does continue to have issues with what appears to be some fluid underneath the  muscle although he did see the wound specialty center at [facility] last week their main goals were to see about infusion therapy in place of the Humira as they feel like that is not quite strong enough. They also recommended that we continue with the treatment otherwise as we are they felt like that was appropriate and they are okay with him continuing to follow-up here with [place] in that regard. With that being said they are also sending him to the vein specialist there to see about vein stripping and if that would be of benefit for him. Subsequently they also did not really address whether or not an ultrasound of the muscle area to see if there is anything that needs to be addressed here would be appropriate or not. For that reason I discussed this with him last week I think we  may proceed down that road at this point. 06/21/2019 upon evaluation today patient's wound actually appears to be doing slightly better compared to previous evaluations. I do believe that he has made a difference with regard to the progression here with the use of oral steroids. Again in the past has been the only thing that is really calm things down. He does tell me that from [facility] is gotten a good news from there that there are no further vein stripping that is necessary at this point. I do not have that available for review today although the patient did relay this to me. He also did obtain and have the ultrasound of the wound completed which I did sign off on today. It does appear that there is no fluid collection under the muscle this is likely then just edematous tissue in general. That is also good news. Overall I still believe the inflammation is the main issue here. He did inquire about the possibility of a wound VAC again with the muscle protruding like it is I am not really sure whether the wound VAC is necessarily ideal or not. That is something we will have to consider although I do believe he may need compression wrapping to  try to help with edema control which could potentially be of benefit. 06/28/2019 on evaluation today patient appears to be doing slightly better measurement wise although this is not terribly smaller he least seems to be trending towards that direction. With that being said he still seems to have purulent drainage noted in the wound bed at this time. He has been on Levaquin followed by Cipro over the past month. Unfortunately he still seems to have some issues with active infection at this time. I did perform a culture last week in order to evaluate and see if indeed there was still anything going on. Subsequently the culture did come back showing Pseudomonas which is consistent with the drainage has been having which is blue-green in color. He also has had an odor that again was somewhat consistent with Pseudomonas as well. Long story short it appears that the culture showed an intermediate finding with regard to how well the Cipro will work for the Pseudomonas infection. Subsequently being that he does not seem to be clearing up and at best what we are doing is just keeping this at [place] I think he may need to see infectious disease to discuss IV antibiotic options. QUAVIS, KLUTZ ([id number]) 07/05/2019 upon evaluation today patient appears to be doing okay in regard to his leg ulcer. He has been tolerating the dressing changes at this point without complication. Fortunately there is no signs of active infection at this time which is good news. No fevers, chills, nausea, vomiting, or diarrhea. With that being said he does have an appointment with infectious disease tomorrow and his primary care on Wednesday. Again the reason for the infectious disease referral was due to the fact that he did not seem to be fully resolving with the use of oral antibiotics and therefore we were thinking that IV antibiotic therapy may be necessary secondary to the fact that there was an intermediate finding for  how effective the Cipro may be. Nonetheless again he has been having a lot of purulent and even green drainage. Fortunately right now that seems to have calmed down over the past week with the reinitiation of the oral antibiotic. Nonetheless we will see what Dr. Megan Salon has to say. 07/12/2019 upon evaluation today patient appears to be  doing about the same at this point in regard to his left lower extremity ulcer. Fortunately there is no signs of active infection at this time which is good news I do believe the Levaquin has been beneficial I did review Dr. Hale Bogus note and to be honest I agree that the patient's leg does appear to be doing better currently. What we found in the past as he does not seem to really completely resolve he will stop the antibiotic and then subsequently things will revert back to having issues with blue-green drainage, increased pain, and overall worsening in general. Obviously that is the reason I sent him back to infectious disease. 07/19/2019 upon evaluation today patient appears to be doing roughly the same in size there is really no dramatic improvement. He has started back on the Levaquin at this point and though he seems to be doing okay he did still have a lot of blue/green drainage noted on evaluation today unfortunately. I think that this is still indicative more likely of a Pseudomonas infection as previously noted and again he does see Dr. Megan Salon in just a couple of days. I do not know that were really able to effectively clear this with just oral antibiotics alone based on what I am seeing currently. Nonetheless we are still continue to try to manage as best we can with regard to the patient and his wound. I do think the wrap was helpful in decreasing the edema which is excellent news. No fevers, chills, nausea, vomiting, or diarrhea. 07/26/2019 upon evaluation today patient appears to be doing slightly better with regard to the overall appearance of the muscle  there is no dark discoloration centrally. Fortunately there is no signs of active infection at this time. No fevers, chills, nausea, vomiting, or diarrhea. Patient's wound bed currently the patient did have an appointment with Dr. Megan Salon at infectious disease last week. With that being said Dr. Megan Salon the patient states was still somewhat hesitant about put him on any IV antibiotics he wanted [place] to repeat cultures today and then see where things go going forward. He does look like Dr. Megan Salon because of some improvement the patient did have with the Levaquin wanted [place] to see about repeating cultures. If it indeed grows the Pseudomonas again then he recommended a possibility of considering a PICC line placement and IV antibiotic therapy. He plans to see the patient back in 1 to 2 weeks. 08/02/2019 upon evaluation today patient appears to be doing poorly with regard to his left lower extremity. We did get the results of his culture back it shows that he is still showing evidence of Pseudomonas which is consistent with the purulent/blue-green drainage that he has currently. Subsequently the culture also shows that he now is showing resistance to the oral fluoroquinolones which is unfortunate as that was really the only thing to treat the infection prior. I do believe that he is looking like this is going require IV antibiotic therapy to get this under control. Fortunately there is no signs of systemic infection at this time which is good news. The patient does see Dr. Megan Salon tomorrow. 08/09/2019 upon evaluation today patient appears to be doing better with regard to his left lower extremity ulcer in regard to the overall appearance. He is currently on IV antibiotic therapy. As ordered by Dr. Megan Salon. Currently the patient is on ceftazidime which she is going to take for the next 2 weeks and then follow-up for 4 to 5-week appointment with Dr. Megan Salon.  The patient started this this past Friday  symptoms have not for a total of 3 days currently in full. 08/16/2019 upon evaluation today patient's wound actually does show muscle in the base of the wound but in general does appear to be much better as far as the overall evidence of infection is concerned. In fact I feel like this is for the most part cleared up he still on the IV antibiotics he has not completed the full course yet but I think he is doing much better which is excellent news. 08/23/2019 upon evaluation today patient appears to be doing about the same with regard to his wound at this point. He tells me that he still has pain unfortunately. Fortunately there is no evidence of systemic infection at this time which is great news. There is significant muscle protrusion. 09/13/19 upon evaluation today patient appears to be doing about the same in regard to his leg unfortunately. He still has a lot of drainage coming from the ulceration there is still muscle exposed. With that being said the patient's last wound culture still showed an intermediate finding with regard to the Pseudomonas he still having the bluish/green drainage as well. Overall I do not know that the wound has completely cleared of infection at this point. Fortunately there is no signs of active infection systemically at this point which is good news. 09/20/2019 upon evaluation today patient's wound actually appears to be doing about the same based on what I am seeing currently. I do not see any signs of systemic infection he still does have evidence of some local infection and drainage. He did see Dr. Megan Salon last week and Dr. Megan Salon states that he probably does need a different IV antibiotic although he does not want to put him on this until the patient begins the Remicade infusion which is actually scheduled for about 10 days out from today on 13 May. Following that time Dr. Megan Salon is good to see him back and then will evaluate the feasibility of starting him on the  IV antibiotic therapy once again at that point. I do not disagree with this plan I do believe as Dr. Megan Salon stated in his note that I reviewed today that the patient's issue is multifactorial with the pyoderma being 1 aspect of this that were hoping the Remicade will be helpful for her. In the meantime I think the gentamicin is, helping to keep things under decent okay control in regard to the ulcer. 09/27/2019 upon evaluation today patient appears to be doing about the same with regard to his wound still there is a lot of muscle exposure though he does have some hyper granulation tissue noted around the edge and actually some granulation tissue starting to form over the muscle which is actually good news. Fortunately there is no evidence of active infection which is also good news. His pain is less at this point. 5/21; this is a patient I have not seen in a long time. 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 He has a large wound on the left lateral leg with protruding muscle. He comes in the clinic today showing the same area on his left medial ankle. He says there is been a spot there for some time although we have not previously defined this. Today he has a clearly defined area with slight amount of skin breakdown surrounded by raised areas with a purplish hue in color. This is not painful he says it is irritated.  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 He still has muscle protruding from the lateral portion of his left leg but fortunately the new area that they were concerned about at his last visit does not appear to have opened at this point. He is currently on Remicade infusions and seems to be doing better in my opinion in fact the wound itself seems to be overall much better. The purplish discoloration that he did have seems to have resolved  and I think that is a good sign that hopefully the Remicade is doing its job. He does have some biofilm noted over the surface of the wound. 11/01/2019 on evaluation today patient's wound actually appears to be doing excellent at this time. Fortunately there is no evidence of active infection and overall I feel like he is making great progress. The Remicade seems to be due excellent job in my opinion. DAMONEY, JULIA ([id number]) 11/08/19 evaluation today vision actually appears to be doing quite well with regard to his weight ulcer. He's been tolerating dressing changes without complication. Fortunately there is no evidence of infection. No fevers, chills, nausea, or vomiting noted at this time. Overall states that is having more itching than pain which is actually a good sign in my opinion. 12/13/2019 upon evaluation today patient appears to be doing well today with regard to his wound. He has been tolerating the dressing changes without complication. Fortunately there is no sign of active infection at this time. No fevers, chills, nausea, vomiting, or diarrhea. Overall I feel like the infusion therapy has been very beneficial for him. 01/06/2020 on evaluation today patient appears to be doing well with regard to his wound. This is measuring smaller and actually looks to be doing better. Fortunately there is no signs of active infection at this point. No fevers, chills, nausea, vomiting, or diarrhea. With that being said he does still have the blue-green drainage but this does not seem to be causing any significant issues currently. He has been using the gentamicin that does seem to be keeping things under decent control at this point. He goes later this morning for his next infusion therapy for the pyoderma which seems to also be very beneficial. 02/07/2020 on evaluation today patient appears to be doing about the same in regard to his wounds currently. Fortunately there is no signs of active infection  systemically he does still have evidence of local infection still using gentamicin. He also is showing some signs of improvement albeit slowly I do feel like we are making some progress here. 02/21/2020 upon evaluation today patient appears to be making some signs of improvement the wound is measuring a little bit smaller which is great news and overall I am very pleased with where he stands currently. He is going to be having infusion therapy treatment on the 15th of this month. Fortunately there is no signs of active infection at this time. 03/13/2020 I do believe patient's wound is actually showing some signs of improvement here which is great news. He has continue with the infusion therapy through rheumatology/dermatology at [facility]. That does seem to be beneficial. I still think he gets as much benefit from this as he did from the prednisone initially but nonetheless obviously this is less harsh on his body that the prednisone as far as they are concerned. 03/31/2020 on evaluation today patient's wound actually showing signs of some pretty good improvement in regard  to the overall appearance of the wound bed. There is still muscle exposed though he does have some epithelial growth around the edges of the wound. Fortunately there is no signs of active infection at this time. No fevers, chills, nausea, vomiting, or diarrhea. 04/24/2020 upon evaluation today patient appears to be doing about the same in regard to his leg ulcer. He has been tolerating the dressing changes without complication. Fortunately there is no signs of active infection at this time. No fevers, chills, nausea, vomiting, or diarrhea. With that being said he still has a lot of irritation from the bandaging around the edges of the wound. We did discuss today the possibility of a referral to plastic surgery. 05/22/2020 on evaluation today patient appears to be doing well with regard to his wounds all things considered. He has not been able  to get the Chantix apparently there is a recall nurse that I was unaware of put out by [employer] involuntarily. Nonetheless for now I am and I have to do some research into what may be the best option for him to help with quitting in regard to smoking and we discussed that today. 06/26/2020 upon evaluation today patient appears to be doing well with regard to his wound from the standpoint of infection I do not see any signs of infection at this point. With that being said unfortunately he is still continuing to have issues with muscle exposure and again he is not having a whole lot of new skin growth unfortunately. There does not appear to be any signs of active infection at this time. No fevers, chills, nausea, vomiting, or diarrhea. 07/10/2020 upon evaluation today patient appears to be doing a little bit more poorly currently compared to where he was previous. I am concerned currently about an active infection that may be getting worse especially in light of the increased size and tenderness of the wound bed. No fevers, chills, nausea, vomiting, or diarrhea. 07/24/2020 upon evaluation today patient appears to be doing poorly in regard to his leg ulcer. He has been tolerating the dressing changes without complication but unfortunately is having a lot of discomfort. 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 Subsequently I think he is going require possibly IV antibiotics to get this under control. I am very concerned about the severity of his infection and the amount of discomfort he is having. 07/31/2020 upon evaluation today patient appears to be doing about the same in regard to his leg wound. He did see Dr. Megan Salon and Dr. Megan Salon is actually going to start him on IV antibiotics. He goes for the PICC line tomorrow. With that being said there do not have that run for 2 weeks and then see how things are doing and depending on how he  is progressing they may extend that a little longer. Nonetheless I am glad this is getting ready to be in place and definitely feel it may help the patient. In the meantime is been using mainly triamcinolone to the wound bed has an anti-inflammatory. 08/07/2020 on evaluation today patient appears to be doing well with regard to his wound compared even last week. In the interim he has gotten the PICC line placed and overall this seems to be doing excellent. There does not appear to be any evidence of infection which is great news systemically although locally of course has had the infection this appears to be improving with the use of the antibiotics. 08/14/2020 upon  evaluation today patient's wound actually showing signs of excellent improvement. Overall the irritation has significantly improved the drainage is back down to more of a normal level and his pain is really pretty much nonexistent compared to what it was. Obviously I think that this is significantly improved secondary to the IV antibiotic therapy which has made all the difference in the world. Again he had a resistant form of Pseudomonas for which oral antibiotics just was not cutting it. Nonetheless I do think that still we need to consider the possibility of a surgical closure for this wound is been open so long and to be honest with muscle exposed I think this can be very hard to get this to close outside of this although definitely were still working to try to do what we can in that regard. 08/21/2020 upon evaluation today patient appears to be doing very well with regard to his wounds on the left lateral lower extremity/calf area. Fortunately there does not appear to be signs of active infection which is great news and overall very pleased with where things stand today. He is actually wrapping up his treatment with IV antibiotics tomorrow. After that we will see where things go from there. 08/28/2020 upon evaluation today patient appears  to be doing decently well with regard to his leg ulcer. There does not appear to be any signs of active infection which is great news and overall very pleased with where things stand today. No fevers, chills, nausea, vomiting, or diarrhea. 09/18/2020 upon evaluation today patient appears to be doing well with regard to his infection which I feel like is better. Unfortunately he is not doing as well with regard to the overall size of the wound which is not nearly as good at this point. I feel like that he may be having an issue here with the pyoderma being somewhat out of control. I think that he may benefit from potentially going back and talking to the dermatologist about DAUNDRE, BIEL ([id number]) what to do from the pyoderma standpoint. I am not certain if the infusions are helping nearly as much is what the prednisone did in the past. 10/02/2020 upon evaluation today patient appears to be doing well with regard to his leg ulcer. He did go to the Psychiatric nurse. Unfortunately they feel like there is a 10% chance that most that he would be able to heal and that the skin graft would take. Obviously this has led him to not be able to go down that path as far as treatment is concerned. Nonetheless he does seem to be doing a little bit better with the prednisone that I gave him last time. I think that he may need to discuss with dermatology the possibility of long-term prednisone as that seems to be what is most helpful for him to be perfectly honest. I am not sure the Remicade is really doing the job. 10/17/2020 upon evaluation today patient appears to be doing a little better in regard to his wound. In fact the case has been since we did the prednisone on May 2 for him that we have noticed a little bit of improvement each time we have seen a size wise as well as appearance wise as well as pain wise. I think the prednisone has had a greater effect then the infusion therapy has to be perfectly honest. With  that being said the patient also feels significantly better compared to what he was previous. All of this is good news but  nonetheless I am still concerned about the fact that again we are really not set up to long-term manage him as far as prednisone is concerned. Obviously there are things that you need to be watched I completely understand the risk of prednisone usage as well. 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 With all that being said I do believe that we can give him another round of the prednisone which she is requesting today because of the improvement that he seen since we did that first round. 10/30/2020 upon evaluation today patient's wound actually is showing signs of doing quite well. There does not appear to be any evidence of infection which is great news and overall very pleased with where things stand today. No fevers, chills, nausea, vomiting, or diarrhea. He tells me that the prednisone still has seem to have helped he wonders if we can extend that for just a little bit longer. He did not have the appointment with a dermatologist although he did have an infusion appointment last Friday. That was at [facility]. With that being said he tells me he could not do both that as well as the appointment with the physician on the same day therefore that is can have to be rescheduled. I really want to see if there is anything they feel like that could be done differently to try to help this out as I am not really certain that the infusions are helping significantly here. 11/13/2020 upon evaluation today patient unfortunately appears to be doing somewhat poorly in regard to his wound I feel like this is actually worsening from the standpoint of the pyoderma spreading. I still feel like that he may need something different as far as trying to manage this going forward. Again we did the prednisone unfortunately his blood sugars are not doing so well  because of this. Nonetheless I believe that the patient likely needs to try topical steroid. We have done triamcinolone for a while I think going with something stronger such as clobetasol could be beneficial again this is not something I do lightly I discussed this with the patient that again this does not normally put underneath an occlusive dressing. Nonetheless I think a thin film as such could help with some of the stronger anti-inflammatory effects. We discussed this today. He would like to try to give this a trial for the next couple weeks. I definitely think that is something that we can do. Evaluate7/03/2021 and today patient's wound bed actually showed signs of doing really about the same. There was a little expansion of the size of the wound and that leading edge that we done looking out although the clobetasol does seem to have slowed this down a bit in my opinion. There is just 1 small area that still seems to be progressing based on what I see. Nonetheless I am concerned about the fact this does not seem to be improving if anything seems to be doing a little bit worse. I do not know that the infusions are really helping him much as next infusion is August 5 his appointment with dermatology is July 25. Either way I really think that we need to have a conversation potentially about this and I am actually going to see if I can talk with Dr. Lillia Carmel in order to see where things stand as well. Electronic Signature(s) Signed: 11/27/2020 12:47:58 PM By: Worthy Keeler PA-C Entered By: Worthy Keeler on  11/27/2020 12:47:57 HUNNER, GARCON ([id number]) -------------------------------------------------------------------------------- Physical Exam Details Patient Name: EMILIANO, WELSHANS Date of Service: 11/27/2020 8:15 AM Medical Record Number: [id number] Patient Account Number: [id number] Date of Birth/Sex: 02/23/1981 (40 y.o. M) Treating RN: Donnamarie Poag Primary Care Provider: [name]  Other Clinician: Referring Provider: [name] Treating Provider/Extender: [name] Cliche in Treatment: 207 Constitutional Obese and well-hydrated in no acute distress. Respiratory normal breathing without difficulty. Psychiatric this patient is able to make decisions and demonstrates good insight into disease process. Alert and Oriented x 3. pleasant and cooperative. Notes Upon inspection patient's wound bed again did not appear to be infected but it is still showing signs of expansion again I do not feel like the infusions are really doing much here for him. I Georgina Peer try to see about getting in touch with Dr. Lillia Carmel to see if there is anything else that can be contemplated here. Subsequently also discussed with him again getting the 2 layer compression which I think would be better from a stronger compression standpoint. He tells me he is got a call today to elastic therapy. Electronic Signature(s) Signed: 11/27/2020 12:48:25 PM By: Worthy Keeler PA-C Entered By: Worthy Keeler on 11/27/2020 12:48:25 [name] ([id number]) -------------------------------------------------------------------------------- Physician Orders Details Patient Name: [name] Date of Service: 11/27/2020 8:15 AM Medical Record Number: [id number] Patient Account Number: [id number] Date of Birth/Sex: Jan 04, 1981 (39 y.o. M) Treating RN: Dolan Amen Primary Care Provider: [name] Other Clinician: Referring Provider: [name] Treating Provider/Extender: [name] Cliche in Treatment: 207 Verbal / Phone Orders: No Diagnosis Coding ICD-10 Coding Code Description [id number] Non-pressure chronic ulcer of left calf with fat layer exposed L88 Pyoderma gangrenosum L97.321 Non-pressure chronic ulcer of left ankle limited to breakdown of skin I87.2 Venous insufficiency (chronic) (peripheral) L03.116 Cellulitis of left lower limb F17.208 Nicotine dependence, unspecified, with other  nicotine-induced disorders Follow-up Appointments o Return Appointment in 2 weeks. Bathing/ Shower/ Hygiene o Clean wound with Normal Saline or wound cleanser. Edema Control - Lymphedema / Segmental Compressive Device / Other o Patient to wear own compression stockings. Remove compression stockings every night before going to bed and put on every morning when getting up. o Other: - Call Elastic Therapy for knee high compression socks 20/53m/Hg Medications-Please add to medication list. o Other: - Clobetasol as prescribed, script sent to pharmacy of choice-apply thin to 7 o'clock area as directed every other day-stop with any increased pain Wound Treatment Wound #1 - Lower Leg Wound Laterality: Left, Lateral Cleanser: Soap and Water Every Other Day/30 Days Discharge Instructions: Gently cleanse wound with antibacterial soap, rinse and pat dry prior to dressing wounds Topical: Triamcinolone Acetonide Cream, 0.1%, 15 (g) tube Every Other Day/30 Days Discharge Instructions: Apply in office only to 7 o'clock area Secondary Dressing: Mepilex Border Flex, 6x6 (in/in) Every Other Day/30 Days Discharge Instructions: Apply to wound as directed. Do not cut. Secured With: Tubigrip Size E, 3.5x10 (in/yds) Every Other Day/30 Days Discharge Instructions: Apply double layer Tubigrip E 3-finger-widths below knee to base of toes to secure dressing and/or for swelling. Electronic Signature(s) Signed: 11/27/2020 3:56:57 PM By: SWorthy KeelerPA-C Signed: 11/27/2020 4:08:35 PM By: SDolan AmenRN Entered By: SDolan Amenon 11/27/2020 09:12:52 LEBENEZER, MCCASKEY([id number] -------------------------------------------------------------------------------- Problem List Details Patient Name: LHaynes KernsDate of Service: 11/27/2020 8:15 AM Medical Record Number: [id number] Account Number: [id number] of Birth/Sex: [birth year](40y.o. M) Treating RN: BDonnamarie PoagPrimary Care Provider:  CAlma FriendlyOther Clinician: Referring Provider: CCarlis Abbott  Katherine Treating Provider/Extender: Jeri Cos Weeks in Treatment: 207 Active Problems ICD-10 Encounter Code Description Active Date MDM Diagnosis L97.222 Non-pressure chronic ulcer of left calf with fat layer exposed 12/04/2016 No Yes L88 Pyoderma gangrenosum 03/26/2017 No Yes L97.321 Non-pressure chronic ulcer of left ankle limited to breakdown of skin 10/08/2019 No Yes I87.2 Venous insufficiency (chronic) (peripheral) 12/04/2016 No Yes L03.116 Cellulitis of left lower limb 05/24/2019 No Yes F17.208 Nicotine dependence, unspecified, with other nicotine-induced disorders 04/24/2020 No Yes Inactive Problems ICD-10 Code Description Active Date Inactive Date L97.213 Non-pressure chronic ulcer of right calf with necrosis of muscle 04/02/2017 04/02/2017 Resolved Problems Electronic Signature(s) Signed: 11/27/2020 8:50:39 AM By: Worthy Keeler PA-C Entered By: Worthy Keeler on 11/27/2020 08:50:39 Flinn, [name] ([id number]) -------------------------------------------------------------------------------- Progress Note Details Patient Name: [name] Date of Service: 11/27/2020 8:15 AM Medical Record Number: [id number] Patient Account Number: [id number] Date of Birth/Sex: [birth year] (39 y.o. M) Treating RN: Donnamarie Poag Primary Care Provider: [name] Other Clinician: Referring Provider: [name] Treating Provider/Extender: [name] Cliche in Treatment: 207 Subjective Chief Complaint Information obtained from Patient He is here in follow up evaluation for LLE pyoderma ulcer History of Present Illness (HPI) 12/04/16; 40 year old man who comes into the clinic today for review of a wound on the posterior left calf. He tells me that is been there for about a year. He is not a diabetic he does smoke half a pack per day. He was seen in the ER on 11/20/16 felt to have cellulitis around the wound and was given  clindamycin. An x-ray did not show osteomyelitis. The patient initially tells me that he has a milk allergy that sets off a pruritic itching rash on his lower legs which she scratches incessantly and he thinks that's what may have set up the wound. He has been using various topical antibiotics and ointments without any effect. He works in a [employer] and is on his feet all day. He does not have a prior history of wounds however he does have the rash on both lower legs the right arm and the ventral aspect of his left arm. These are excoriations and clearly have had scratching however there are of macular looking areas on both legs including a substantial larger area on the right leg. This does not have an underlying open area. There is no blistering. The patient tells me that 2 years ago in [place] in response to the rash on his legs he saw a dermatologist who told him he had a condition which may be pyoderma gangrenosum although I may be putting words into his mouth. He seemed to recognize this. 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 He is not in any treatment for this. He's had no recent travel 12/11/16; the patient arrives today with his wound and roughly the same condition we've been using silver alginate this is a deep punched out wound with some surrounding erythema but no tenderness. Biopsy I did did not show confirmed pyoderma gangrenosum suggested nonspecific inflammation and vasculitis but does not provide an actual description of what was seen by the pathologist. I'm really not able to understand this We have also received information from the patient's dermatologist in [place] notes from April 2016. This was a doctor Agarwal-antal. 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 He was prescribed topical steroid high potency under occlusion which helped but at this point the patient did not have a deep punched out wound. 12/18/16;  the  patient's wound is larger in terms of surface area however this surface looks better and there is less depth. The surrounding erythema also is better. The patient states that the wrap we put on came off 2 days ago when he has been using his compression stockings. He we are in the process of getting a dermatology consult. 12/26/16 on evaluation today patient's left lower extremity wound shows evidence of infection with surrounding erythema noted. He has been tolerating the dressing changes but states that he has noted more discomfort. There is a larger area of erythema surrounding the wound. No fevers, chills, nausea, or vomiting noted at this time. With that being said the wound still does have slough covering the surface. He is not allergic to any medication that he is aware of at this point. In regard to his right lower extremity he had several regions that are erythematous and pruritic he wonders if there's anything we can do to help that. 01/02/17 I reviewed patient's wound culture which was obtained his visit last week. He was placed on doxycycline at that point. Unfortunately that does not appear to be an antibiotic that would likely help with the situation however the pseudomonas noted on culture is sensitive to Cipro. Also unfortunately patient's wound seems to have a large compared to last week's evaluation. Not severely so but there are definitely increased measurements in general. He is continuing to have discomfort as well he writes this to be a seven out of 10. In fact he would prefer me not to perform any debridement today due to the fact that he is having discomfort and considering he has an active infection on the little reluctant to do so anyway. No fevers, chills, nausea, or vomiting noted at this time. 01/08/17; patient seems dermatology on September 5. I suspect dermatology will want the slides from the biopsy I did sent to their pathologist. I'm not sure if there is a way we can  expedite that. In any case the culture I did before I left on vacation 3 weeks ago showed Pseudomonas he was given 10 days of Cipro and per her description of her intake nurses is actually somewhat better this week although the wound is quite a bit bigger than I remember the last time I saw this. He still has 3 more days of Cipro 01/21/17; dermatology appointment tomorrow. He has completed the ciprofloxacin for Pseudomonas. Surface of the wound looks better however he is had some deterioration in the lesions on his right leg. Meantime the left lateral leg wound we will continue with sample 01/29/17; patient had his dermatology appointment but I can't yet see that note. He is completed his antibiotics. The wound is more superficial but considerably larger in circumferential area than when he came in. This is in his left lateral calf. He also has swollen erythematous areas with superficial wounds on the right leg and small papular areas on both arms. There apparently areas in her his upper thighs and buttocks I did not look at those. Dermatology biopsied the right leg. Hopefully will have their input next week. 02/05/17; patient went back to see his dermatologist who told him that he had a "scratching problem" as well as staph. He is now on a 30 day course of doxycycline and I believe she gave him triamcinolone cream to the right leg areas to help with the itching [not exactly sure but probably triamcinolone]. She apparently looked at the left lateral leg wound although this was not  rebiopsied and I think felt to be ultimately part of the same pathogenesis. He is using sample border foam and changing nevus himself. He now has a new open area on the right posterior leg which was his biopsy site I don't have any of the dermatology notes 02/12/17; we put the patient in compression last week with SANTYL to the wound on the left leg and the biopsy. Edema is much better and the depth of the wound is now at level  of skin. Area is still the same Biopsy site on the right lateral leg we've also been using santyl with a border foam dressing and he is changing this himself. 02/19/17; Using silver alginate started last week to both the substantial left leg wound and the biopsy site on the right wound. He is tolerating compression well. Has a an appointment with his primary M.D. tomorrow wondering about diuretics although I'm wondering if the edema problem is actually lymphedema ZAEL, SHUMAN ([id number]) 02/26/17; the patient has been to see his primary doctor Dr. Jerrel Ivory at [facility] our primary care. She started him on Lasix 20 mg and this seems to have helped with the edema. However we are not making substantial change with the left lateral calf wound and inflammation. The biopsy site on the right leg also looks stable but not really all that different. 03/12/17; the patient has been to see vein and vascular Dr. Lucky Cowboy. He has had venous reflux studies I have not reviewed these. I did get a call from his dermatology office. 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 They therefore recommended clobetasol and tetracycline i.e. 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 03/26/17; apparently the patient just had reflux studies not an appointment with Dr. dew. She arrives in clinic today having applied clobetasol for 2-3 weeks. He notes over the last 2-3 days excessive drainage having to change the dressing 3-4 times a day and also expanding erythema. He states the expanding erythema seems to come and go and was last this red was earlier in the month.he is on doxycycline 150 mg twice a day as an anti-inflammatory systemic therapy for possible  pyoderma gangrenosum along with the topical clobetasol 04/02/17; the patient was seen last week by Dr. Lillia Carmel at [facility] dermatology locally who kindly saw him at my request. 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 My concern was the degree of erythema medially extending from his left leg wound which was either inflammation from pyoderma or cellulitis. I put him on Augmentin however culture of the wound showed Pseudomonas which is quinolone sensitive. I really don't believe he has cellulitis however in view of everything I will continue and give him a course of Cipro. He is also on doxycycline as an immune modulator for the pyoderma. In addition to his original wound on the left lateral leg with surrounding erythema he has a wound on the right posterior calf which was an original biopsy site done by dermatology. 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 Saw Dr. Lillia Carmel yesterday. He has been using topical antibiotics to both wound areas his original wound on the left and the biopsies/pathergy area on  the right. There is definitely some improvement in the inflammation around the wound on the right although the patient states he has increasing sensitivity of the wounds. He is on prednisone 60 and doxycycline 1 as prescribed by Dr. Lillia Carmel. He is covering the topical antibiotic with gauze and putting this in his own compression stocks and changing this daily. He states that Dr. Lottie Rater did a culture of the left leg wound yesterday 05/07/17; pyoderma gangrenosum. The patient saw Dr. Lillia Carmel yesterday and has a follow-up with her in one month. He is still using topical antibiotics to both wounds although he can't recall exactly what type. He is still on prednisone 60 mg. Dr. Lillia Carmel stated that the doxycycline could stop if we were in agreement. 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 The right medial leg was induced by biopsy/pathergy. The area on the right is essentially healed. 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 The right medial leg has closed and remains closed. He is still on prednisone 60. He tells me he missed his last dermatology appointment with Dr. Lillia Carmel but will make another appointment. He reports that her blood sugar at a recent screen in [place] was high 200's. He was 180 today. 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 I think he is going to require still much longer prednisone perhaps another 3 months before attempting to taper. In the meantime his wound is a lot better. Smaller. He is cleaning this off daily and applying topical antibiotics. When he was last in the clinic I thought about changing to [facility] and actually put in a couple of calls to dermatology although probably not during their business hours. 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 He continues on oral prednisone. He has been using triple antibiotic ointment. There is surface debris and we will transition to [facility] and have him return in 2 weeks. He has lost 30 pounds since his last appointment with lifestyle modification. He may benefit from topical steroid cream for treatment this can be considered at a later date. 08/22/17 on evaluation today patient appears to actually be doing rather well in regard to his left lateral lower extremity ulcer. He has actually been managed by Dr. Dellia Nims most recently. Patient is currently on oral steroids at this time. This seems to have been of benefit for him. Nonetheless his last visit was actually  with Leah on 08/06/17. Currently he is not utilizing any topical steroid creams although this could be of benefit as well. No fevers, chills, nausea, or vomiting noted at this time. 09/05/17 on evaluation today patient appears to be doing better in regard to his left lateral lower extremity ulcer. He has been tolerating the dressing changes without complication. He is using Santyl with good effect. Overall I'm very pleased with how things are standing at this point. Patient likewise is happy that this is doing better. 09/19/17 on evaluation today patient actually appears to be doing rather well in regard to his left lateral lower extremity ulcer. 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 He's not having any significant  pain. 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 He has been tolerating the Santyl without complication and in general I feel like he's making good progress. 10/17/17 on evaluation today patient appears to be doing very well in regard to his left lateral lower surety ulcer. He has been tolerating the dressing changes without complication. There does not appear to be any evidence of infection he's alternating the Santyl and the triple antibiotic ointment every other day this seems to be doing well for him. 11/03/17 on evaluation today patient appears to be doing very well in regard to his left lateral lower extremity ulcer. He is been tolerating the dressing changes without complication which is good news. Fortunately there does not appear to be any evidence of infection which is also great news. Overall is doing excellent they are starting to taper down on the prednisone is down to 40 mg at this point it also started topical clobetasol for him. 11/17/17 on evaluation today patient appears to be doing well in  regard to his left lateral lower surety ulcer. He's been tolerating the dressing changes without complication. He does note that he is having no pain, no excessive drainage or discharge, and overall he feels like things are going about how he would expect and hope they would. Overall he seems to have no evidence of infection at this time in my opinion which is good news. 12/04/17-He is seen in follow-up evaluation for right lateral lower extremity ulcer. He has been applying topical steroid cream. Today's measurement show slight increase in size. Over the next 2 weeks we will transition to every other day Santyl and steroid cream. He has been encouraged to monitor for changes and notify clinic with any concerns 12/15/17 on evaluation today patient's left lateral motion the ulcer and fortunately is doing worse again at this point. This just since last week to this week has close to doubled in size according to the patient. I did not seeing last week's I do not have a visual to compare this to in our system was also down so we do not have all the charts and at this point. Nonetheless it does have me somewhat concerned in regard to the fact that again he was worried enough about it he has contact the dermatology that placed them back on the full strength, 50 mg a day of the prednisone that he was taken previous. He continues to alternate using clobetasol along with Santyl at this point. He is obviously somewhat frustrated. NICHOALS, HEYDE ([id number]) 12/22/17 on evaluation today patient appears to be doing a little worse compared to last evaluation. Unfortunately the wound is a little deeper and slightly larger than the last week's evaluation. With that being said he has made some progress in regard to the irritation surrounding at this time unfortunately despite that progress that's been made he still has a significant issue going on here. I'm not certain that he is having really any true infection at this  time although with the Pyoderma gangrenosum it can sometimes be difficult to differentiate infection versus just inflammation. For that reason I discussed with him today the possibility of perform a wound culture to ensure there's nothing overtly infected. 01/06/18 on evaluation today patient's wound is larger and deeper than previously evaluated. With that being said it did appear that his wound was infected after my last evaluation with him. Subsequently I did  end up prescribing a prescription for Bactrim DS which she has been taking and having no complication with. Fortunately there does not appear to be any evidence of infection at this point in time as far as anything spreading, no want to touch, and overall I feel like things are showing signs of improvement. 01/13/18 on evaluation today patient appears to be even a little larger and deeper than last time. There still muscle exposed in the base of the wound. Nonetheless he does appear to be less erythematous I do believe inflammation is calming down also believe the infection looks like it's probably resolved at this time based on what I'm seeing. No fevers, chills, nausea, or vomiting noted at this time. 01/30/18 on evaluation today patient actually appears to visually look better for the most part. Unfortunately those visually this looks better he does seem to potentially have what may be an abscess in the muscle that has been noted in the central portion of the wound. This is the first time that I have noted what appears to be fluctuance in the central portion of the muscle. With that being said I'm somewhat more concerned about the fact that this might indicate an abscess formation at this location. I do believe that an ultrasound would be appropriate. This is likely something we need to try to do as soon as possible. He has been switch to mupirocin ointment and he is no longer using the steroid ointment as prescribed by dermatology he sees them  again next week he's been decreased from 60 to 40 mg of prednisone. 03/09/18 on evaluation today patient actually appears to be doing a little better compared to last time I saw him. There's not as much erythema surrounding the wound itself. He I did review his most recent infectious disease note which was dated 02/24/18. He saw Dr. Michel Bickers in [place]. 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 Patient is now off of antibiotics and they are continually observing this. There seems to be no change in the past two weeks in my pinion based on what the patient says and what I see today compared to what Dr. Megan Salon likely saw two weeks ago. No fevers, chills, nausea, or vomiting noted at this time. 03/23/18 on evaluation today patient's wound actually appears to be showing signs of improvement which is good news. He is currently still on the Dapsone. He is also working on tapering the prednisone to get off of this and Dr. Lottie Rater is working with him in this regard. Nonetheless overall I feel like the wound is doing well it does appear based on the infectious disease note that I reviewed from Dr. 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 I did also review the results of his ultrasound of left lower extremity which revealed there was a dentist tissue in the base of the wound without an abscess noted. 04/06/18 on evaluation today the patient's left lateral lower extremity ulcer actually appears to be doing fairly well which is excellent news. There does not appear to be any evidence of infection at this time which is also great news. Overall he still does have a significantly large ulceration although little by little he seems to be making progress. He is down to 10 mg a day  of the  prednisone. 04/20/18 on evaluation today patient actually appears to be doing excellent at this time in regard to his left lower extremity ulcer. He's making signs of good progress unfortunately this is taking much longer than we would really like to see but nonetheless he is making progress. Fortunately there does not appear to be any evidence of infection at this time. No fevers, chills, nausea, or vomiting noted at this time. The patient has not been using the Santyl due to the cost he hadn't got in this field yet. He's mainly been using the antibiotic ointment topically. Subsequently he also tells me that he really has not been scrubbing in the shower I think this would be helpful again as I told him it doesn't have to be anything too aggressive to even make it believe just enough to keep it free of some of the loose slough and biofilm on the wound surface. 05/11/18 on evaluation today patient's wound appears to be making slow but sure progress in regard to the left lateral lower extremity ulcer. He is been tolerating the dressing changes without complication. Fortunately there does not appear to be any evidence of infection at this time. He is still just using triple antibiotic ointment along with clobetasol occasionally over the area. He never got the Santyl and really does not seem to intend to in my pinion. 06/01/18 on evaluation today patient appears to be doing a little better in regard to his left lateral lower extremity ulcer. He states that overall he does not feel like he is doing as well with the Dapsone as he did with the prednisone. Nonetheless he sees his dermatologist later today and is gonna talk to them about the possibility of going back on the prednisone. Overall again I believe that the wound would be better if you would utilize Santyl but he really does not seem to be interested in going back to the [place] at this point. He has been using triple antibiotic ointment. 06/15/18 on  evaluation today patient's wound actually appears to be doing about the same at this point. Fortunately there is no signs of infection at this time. He has made slight improvements although he continues to not really want to clean the wound bed at this point. He states that he just doesn't mess with it he doesn't want to cause any problems with everything else he has going on. He has been on medication, antibiotics as prescribed by his dermatologist, for a staff infection of his lower extremities which is really drying out now and looking much better he tells me. Fortunately there is no sign of overall infection. 06/29/18 on evaluation today patient appears to be doing well in regard to his left lateral lower surety ulcer all things considering. Fortunately his staff infection seems to be greatly improved compared to previous. He has no signs of infection and this is drying up quite nicely. He is still the doxycycline for this is no longer on cental, Dapsone, or any of the other medications. His dermatologist has recommended possibility of an infusion but right now he does not want to proceed with that. 07/13/18 on evaluation today patient appears to be doing about the same in regard to his left lateral lower surety ulcer. Fortunately there's no signs of infection at this time which is great news. Unfortunately he still builds up a significant amount of Slough/biofilm of the surface of the wound he still is not really cleaning this as he should be appropriately.  Again I'm able to easily with saline and gauze remove the majority of this on the surface which if you would do this at home would likely be a dramatic improvement for him as far as getting the area to improve. Nonetheless overall I still feel like he is making progress is just very slow. I think Santyl will be of benefit for him as well. Still he has not gotten this as of this point. 07/27/18 on evaluation today patient actually appears to be  doing little worse in regards of the erythema around the periwound region of the wound he also tells me that he's been having more drainage currently compared to what he was experiencing last time I saw him. He states not quite as bad as what he had because this was infected previously but nonetheless is still appears to be doing poorly. Fortunately there is no evidence of systemic infection at this point. The patient tells me that he is not going to be able to afford the Santyl. He is still waiting to hear about the infusion therapy with his dermatologist. Apparently she wants an updated colonoscopy first. BARBARA, KENG ([id number]) 08/10/18 on evaluation today patient appears to be doing better in regard to his left lateral lower extremity ulcer. Fortunately he is showing signs of improvement in this regard he's actually been approved for Remicade infusion's as well although this has not been scheduled as of yet. Fortunately there's no signs of active infection at this time in regard to the wound although he is having some issues with infection of the right lower extremity is been seen as dermatologist for this. Fortunately they are definitely still working with him trying to keep things under control. 09/07/18 on evaluation today patient is actually doing rather well in regard to his left lateral lower extremity ulcer. He notes these actually having some hair grow back on his extremity which is something he has not seen in years. He also tells me that the pain is really not giving them any trouble at this time which is also good news overall she is very pleased with the progress he's using a combination of the mupirocin along with the probate is all mixed. 09/21/18 on evaluation today patient actually appears to be doing fairly well all things considered in regard to his looks from the ulcer. He's been tolerating the dressing changes without complication. Fortunately there's no signs of active infection  at this time which is good news he is still on all antibiotics or prevention of the staff infection. He has been on prednisone for time although he states it is gonna contact his dermatologist and see if she put them on a short course due to some irritation that he has going on currently. Fortunately there's no evidence of any overall worsening this is going very slow I think cental would be something that would be helpful for him although he states that $50 for tube is quite expensive. He therefore is not willing to get that at this point. 10/06/18 on evaluation today patient actually appears to be doing decently well in regard to his left lateral leg ulcer. He's been tolerating the dressing changes without complication. Fortunately there's no signs of active infection at this time. Overall I'm actually rather pleased with the progress he's making although it's slow he doesn't show any signs of infection and he does seem to be making some improvement. I do believe that he may need a switch up and dressings to try to help  this to heal more appropriately and quickly. 10/19/18 on evaluation today patient actually appears to be doing better in regard to his left lateral lower extremity ulcer. This is shown signs of having much less Slough buildup at this point due to the fact he has been using the [employer]. Obviously this is very good news. The overall size of the wound is not dramatically smaller but again the appearance is. 11/02/18 on evaluation today patient actually appears to be doing quite well in regard to his lower [place] ulcer. A lot of the skin around the ulcer is actually somewhat irritating at this point this seems to be more due to the dressing causing irritation from the adhesive that anything else. Fortunately there is no signs of active infection at this time. 11/24/18 on evaluation today patient appears to be doing a little worse in regard to his overall appearance of his lower extremity  ulcer. There's more erythema and warmth around the wound unfortunately. He is currently on doxycycline which he has been on for some time. With that being said I'm not sure that seems to be helping with what appears to possibly be an acute cellulitis with regard to his left lower extremity ulcer. No fevers, chills, nausea, or vomiting noted at this time. 12/08/18 on evaluation today patient's wounds actually appears to be doing significantly better compared to his last evaluation. He has been using Santyl along with alternating tripling about appointment as well as the steroid cream seems to be doing quite well and the wound is showing signs of improvement which is excellent news. Fortunately there's no evidence of infection and in fact his culture came back negative with only normal skin flora noted. 12/21/2018 upon evaluation today patient actually appears to be doing excellent with regard to his ulcer. This is actually the best that I have seen it since have been helping to take care of him. It is both smaller as well as less slough noted on the surface of the wound and seems to be showing signs of good improvement with new skin growing from the edges. He has been using just the triamcinolone he does wonder if he can get a refill of that ointment today. 01/04/2019 upon evaluation today patient actually appears to be doing well with regard to his left lateral lower extremity ulcer. With that being said it does not appear to be that he is doing quite as well as last time as far as progression is concerned. There does not appear to be any signs of infection or significant irritation which is good news. With that being said I do believe that he may benefit from switching to a collagen based dressing based on how clean The wound appears. 01/18/2019 on evaluation today patient actually appears to be doing well with regard to his wound on the left lower extremity. He is not made a lot of progress compared to  where we were previous but nonetheless does seem to be doing okay at this time which is good news. There is no signs of active infection which is also good news. My only concern currently is I do wish we can get him into utilizing the collagen dressing his insurance would not pay for the supplies that we ordered although it appears that he may be able to order this through his supply company that he typically utilizes. This is [employer]. Nonetheless he did try to order it during the office visit today and it appears this did go through. We will  see if he can get that it is a different brand but nonetheless he has collagen and I do think will be beneficial. 02/01/2019 on evaluation today patient actually appears to be doing a little worse today in regard to the overall size of his wounds. Fortunately there is no signs of active infection at this time. That is visually. Nonetheless when this is happened before it was due to infection. For that reason were somewhat concerned about that this time as well. 02/08/2019 on evaluation today patient unfortunately appears to be doing slightly worse with regard to his wound upon evaluation today. Is measuring a little deeper and a little larger unfortunately. I am not really sure exactly what is causing this to enlarge he actually did see his dermatologist she is going to see about initiating Humira for him. Subsequently she also did do steroid injections into the wound itself in the periphery. Nonetheless still nonetheless he seems to be getting a little bit larger he is gone back to just using the steroid cream topically which I think is appropriate. I would say hold off on the collagen for the time being is definitely a good thing to do. Based on the culture results which we finally did get the final result back regarding it shows staph as the bacteria noted again that can be a normal skin bacteria based on the fact however he is having increased drainage and  worsening of the wound measurement wise I would go ahead and place him on an antibiotic today I do believe for this. 02/15/2019 on evaluation today patient actually appears to be doing somewhat better in regard to his ulcer. There is no signs of worsening at this time I did review his culture results which showed evidence of Staphylococcus aureus but not MRSA. Again this could just be more related to the normal skin bacteria although he states the drainage has slowed down quite a bit he may have had a mild infection not just colonization. And was much smaller and then since around10/04/2019 on evaluation today patient appears to be doing unfortunately worse as far as the size of the wound. I really feel like that this is steadily getting larger again it had been doing excellent right at the beginning of September we have seen a steady increase in the area of the wound it is almost 2-1/2 times the size it was on September 1. Obviously this is a bad trend this is not wanting to see. For that reason we went back to using just the topical triamcinolone cream which does seem to help with inflammation. I checked him for bacteria by way of culture and nothing showed positive there. I am considering giving him a short course of a tapering steroid Arwin Bisceglia, Jansen ([id number]) today to see if that is can be beneficial for him. The patient is in agreement with giving that a try. 03/08/2019 on evaluation today patient appears to be doing very well in comparison to last evaluation with regard to his lower extremity ulcer. This is showing signs of less inflammation and actually measuring slightly smaller compared to last time every other week over the past month and a half he has been measuring larger larger larger. Nonetheless I do believe that the issue has been inflammation the prednisone does seem to have been beneficial for him which is good news. No fevers, chills, nausea, vomiting, or  diarrhea. 03/22/2019 on evaluation today patient appears to be doing about the same with regard to his leg  ulcer. He has been tolerating the dressing changes without complication. With that being said the wound seems to be mostly arrested at its current size but really is not making any progress except for when we prescribed the prednisone. He did show some signs of dropping as far as the overall size of the wound during that interval week. Nonetheless this is something he is not on long-term at this point and unfortunately I think he is getting need either this or else the Humira which his dermatologist has discussed try to get approval for. With that being said he will be seeing his dermatologist on the 11th of this month that is November. 04/19/2019 on evaluation today patient appears to be doing really about the same the wound is measuring slightly larger compared to last time I saw him. He has not been into the office since November 2 due to the fact that he unfortunately had Covid as that his entire family. He tells me that it was rough but they did pull-through and he seems to be doing much better. Fortunately there is no signs of active infection at this time. No fevers, chills, nausea, vomiting, or diarrhea. 05/10/2019 on evaluation today patient unfortunately appears to be doing significantly worse as compared to last time I saw him. He does tell me that he has had his first dose of Humira and actually is scheduled to get the next one in the upcoming week. With that being said he tells me also that in the past several days he has been having a lot of issues with green drainage she showed me a picture this is more blue-green in color. He is also been having issues with increased sloughy buildup and the wound does appear to be larger today. Obviously this is not the direction that we want everything to take based on the starting of his Humira. Nonetheless I think this is definitely a result of  likely infection and to be honest I think this is probably Pseudomonas causing the infection based on what I am seeing. 05/24/2019 on evaluation today patient unfortunately appears to be doing significantly worse compared to his prior evaluation with me 2 weeks ago. I did review his culture results which showed that he does have Staph aureus as well as Pseudomonas noted on the culture. Nonetheless the Levaquin that I prescribed for him does not appear to have been appropriate and in fact he tells me he is no longer experiencing the green drainage and discharge that he had at the last visit. Fortunately there is no signs of active infection at this time which is good news although the wound has significantly worsened it in fact is much deeper than it was previous. We have been utilizing up to this point triamcinolone ointment as the prescription topical of choice but at this time I really feel like that the wound is getting need to be packed in order to appropriately manage this due to the deeper nature of the wound. Therefore something along the lines of an alginate dressing may be more appropriate. 05/31/2019 upon inspection today patient's wound actually showed signs of doing poorly at this point. Unfortunately he just does not seem to be making any good progress despite what we have tried. He actually did go ahead and pick up the Cipro and start taking that as he was noticing more green drainage he had previously completed the Levaquin that I prescribed for him as well. Nonetheless he missed his appointment for the seventh last  week on Wednesday with the wound care center and [facility] where his dermatologist referred him. Obviously I do think a second opinion would be helpful at this point especially in light of the fact that the patient seems to be doing so poorly despite the fact that we have tried everything that I really know how at this point. The only thing that ever seems to have helped  him in the past is when he was on high doses of continual steroids that did seem to make a difference for him. 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 06/07/2019 upon evaluation today patient unfortunately appears to be doing worse yet again with regard to his wound. In fact I am starting to question whether or not he may have a fluid pocket in the muscle at this point based on the bulging and the soft appearance to the central portion of the muscle area. There is not anything draining from the muscle itself at this time which is good news but nonetheless the wound is expanding. I am not really seeing any results of the Humira as far as overall wound progression based on what I am seeing at this point. The patient has been referred for second opinion with regard to his wound to the [facility] wound care center by his dermatologist which I definitely am not in opposition to. Unfortunately we tried multiple dressings in the past including collagen, alginate, and at one point even Hydrofera Blue. With that being said he is never really used it for any significant amount of time due to the fact that he often complains of pain associated with these dressings and then will go back to either using the Santyl which she has done intermittently or more frequently the triamcinolone. He is also using his own compression stockings. We have wrapped him in the past but again that was something else that he really was not a big fan of. 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 He seemed to do much better when he was on the steroids although honestly I understand that the steroids are not necessarily the best medication to be on long-term obviously 06/14/2019 on evaluation today patient appears to be doing actually a little bit better with regard to the overall appearance with his leg. Unfortunately he does continue to have issues with what appears to be some fluid underneath the muscle although he did see the wound specialty center at [facility] last week their main goals were to see about infusion therapy in place of the Humira as they feel like that is not quite strong enough. They also recommended that we continue with the treatment otherwise as we are they felt like that was appropriate and they are okay with him continuing to follow-up here with [place] in that regard. With that being said they are also sending him to the vein specialist there to see about vein stripping and if that would be of benefit  for him. Subsequently they also did not really address whether or not an ultrasound of the muscle area to see if there is anything that needs to be addressed here would be appropriate or not. For that reason I discussed this with him last week I think we may proceed down that road at this point. 06/21/2019 upon evaluation today patient's wound actually appears to be doing slightly better compared to previous evaluations. I do believe that he has made a difference with regard to the progression here with the use of oral steroids. Again in the past has been the only thing that is really calm things down. He does tell me that from [facility] is gotten a good news from there that there are no further vein stripping that is necessary at this point. I do not have that available for review today although the patient did relay this to me. He also did obtain and have the ultrasound of the wound completed which I did sign off on today. It does appear that there is no  fluid collection under the muscle this is likely then just edematous tissue in general. That is also good news. Overall I still believe the inflammation is the main issue here. He did inquire about the possibility of a wound VAC again with the muscle protruding like it is I am not really sure whether the wound VAC is necessarily ideal or not. That is something we will have to consider although I do believe he may need compression wrapping to try to help with edema control which could potentially be of benefit. 06/28/2019 on evaluation today patient appears to be doing slightly better measurement wise although this is not terribly smaller he least seems to be trending towards that direction. With that being said he still seems to have purulent drainage noted in the wound bed at this time. He has been on Levaquin followed by Cipro over the past month. Unfortunately he still seems to have some issues with active infection at this time. I did perform a culture last week in order to evaluate and see if indeed there was still anything going on. Subsequently the culture did come back Thurmon, Maksim ([id number]) showing Pseudomonas which is consistent with the drainage has been having which is blue-green in color. He also has had an odor that again was somewhat consistent with Pseudomonas as well. Long story short it appears that the culture showed an intermediate finding with regard to how well the Cipro will work for the Pseudomonas infection. Subsequently being that he does not seem to be clearing up and at best what we are doing is just keeping this at [place] I think he may need to see infectious disease to discuss IV antibiotic options. 07/05/2019 upon evaluation today patient appears to be doing okay in regard to his leg ulcer. He has been tolerating the dressing changes at this point without complication. Fortunately there is no signs of active infection at this time which is good news. No fevers, chills,  nausea, vomiting, or diarrhea. With that being said he does have an appointment with infectious disease tomorrow and his primary care on Wednesday. Again the reason for the infectious disease referral was due to the fact that he did not seem to be fully resolving with the use of oral antibiotics and therefore we were thinking that IV antibiotic therapy may be necessary secondary to the fact that there was an intermediate finding for how effective the Cipro may be. Nonetheless again he has  been having a lot of purulent and even green drainage. Fortunately right now that seems to have calmed down over the past week with the reinitiation of the oral antibiotic. Nonetheless we will see what Dr. Megan Salon has to say. 07/12/2019 upon evaluation today patient appears to be doing about the same at this point in regard to his left lower extremity ulcer. Fortunately there is no signs of active infection at this time which is good news I do believe the Levaquin has been beneficial I did review Dr. Hale Bogus note and to be honest I agree that the patient's leg does appear to be doing better currently. What we found in the past as he does not seem to really completely resolve he will stop the antibiotic and then subsequently things will revert back to having issues with blue-green drainage, increased pain, and overall worsening in general. Obviously that is the reason I sent him back to infectious disease. 07/19/2019 upon evaluation today patient appears to be doing roughly the same in size there is really no dramatic improvement. He has started back on the Levaquin at this point and though he seems to be doing okay he did still have a lot of blue/green drainage noted on evaluation today unfortunately. I think that this is still indicative more likely of a Pseudomonas infection as previously noted and again he does see Dr. Megan Salon in just a couple of days. I do not know that were really able to effectively clear this  with just oral antibiotics alone based on what I am seeing currently. Nonetheless we are still continue to try to manage as best we can with regard to the patient and his wound. I do think the wrap was helpful in decreasing the edema which is excellent news. No fevers, chills, nausea, vomiting, or diarrhea. 07/26/2019 upon evaluation today patient appears to be doing slightly better with regard to the overall appearance of the muscle there is no dark discoloration centrally. Fortunately there is no signs of active infection at this time. No fevers, chills, nausea, vomiting, or diarrhea. Patient's wound bed currently the patient did have an appointment with Dr. Megan Salon at infectious disease last week. With that being said Dr. Megan Salon the patient states was still somewhat hesitant about put him on any IV antibiotics he wanted [place] to repeat cultures today and then see where things go going forward. He does look like Dr. Megan Salon because of some improvement the patient did have with the Levaquin wanted [place] to see about repeating cultures. If it indeed grows the Pseudomonas again then he recommended a possibility of considering a PICC line placement and IV antibiotic therapy. He plans to see the patient back in 1 to 2 weeks. 08/02/2019 upon evaluation today patient appears to be doing poorly with regard to his left lower extremity. We did get the results of his culture back it shows that he is still showing evidence of Pseudomonas which is consistent with the purulent/blue-green drainage that he has currently. Subsequently the culture also shows that he now is showing resistance to the oral fluoroquinolones which is unfortunate as that was really the only thing to treat the infection prior. I do believe that he is looking like this is going require IV antibiotic therapy to get this under control. Fortunately there is no signs of systemic infection at this time which is good news. The patient does see Dr.  Megan Salon tomorrow. 08/09/2019 upon evaluation today patient appears to be doing better with regard to his left  lower extremity ulcer in regard to the overall appearance. He is currently on IV antibiotic therapy. As ordered by Dr. Megan Salon. Currently the patient is on ceftazidime which she is going to take for the next 2 weeks and then follow-up for 4 to 5-week appointment with Dr. Megan Salon. The patient started this this past Friday symptoms have not for a total of 3 days currently in full. 08/16/2019 upon evaluation today patient's wound actually does show muscle in the base of the wound but in general does appear to be much better as far as the overall evidence of infection is concerned. In fact I feel like this is for the most part cleared up he still on the IV antibiotics he has not completed the full course yet but I think he is doing much better which is excellent news. 08/23/2019 upon evaluation today patient appears to be doing about the same with regard to his wound at this point. He tells me that he still has pain unfortunately. Fortunately there is no evidence of systemic infection at this time which is great news. There is significant muscle protrusion. 09/13/19 upon evaluation today patient appears to be doing about the same in regard to his leg unfortunately. He still has a lot of drainage coming from the ulceration there is still muscle exposed. With that being said the patient's last wound culture still showed an intermediate finding with regard to the Pseudomonas he still having the bluish/green drainage as well. Overall I do not know that the wound has completely cleared of infection at this point. Fortunately there is no signs of active infection systemically at this point which is good news. 09/20/2019 upon evaluation today patient's wound actually appears to be doing about the same based on what I am seeing currently. I do not see any signs of systemic infection he still does have  evidence of some local infection and drainage. He did see Dr. Megan Salon last week and Dr. Megan Salon states that he probably does need a different IV antibiotic although he does not want to put him on this until the patient begins the Remicade infusion which is actually scheduled for about 10 days out from today on 13 May. Following that time Dr. Megan Salon is good to see him back and then will evaluate the feasibility of starting him on the IV antibiotic therapy once again at that point. I do not disagree with this plan I do believe as Dr. Megan Salon stated in his note that I reviewed today that the patient's issue is multifactorial with the pyoderma being 1 aspect of this that were hoping the Remicade will be helpful for her. In the meantime I think the gentamicin is, helping to keep things under decent okay control in regard to the ulcer. 09/27/2019 upon evaluation today patient appears to be doing about the same with regard to his wound still there is a lot of muscle exposure though he does have some hyper granulation tissue noted around the edge and actually some granulation tissue starting to form over the muscle which is actually good news. Fortunately there is no evidence of active infection which is also good news. His pain is less at this point. 5/21; this is a patient I have not seen in a long time. 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 He has a large wound on the left lateral leg with protruding muscle. He comes in the clinic today showing the same area on his left medial ankle. He says  there is been a spot there for some time although we have not previously defined this. Today he has a clearly defined area with slight amount of skin breakdown surrounded by raised areas with a purplish hue in color. This is not painful he says it is irritated. This looks distinctly like I might imagine pyoderma starting 10/25/2019 upon evaluation today patient's wound  actually appears to be making some progress. He still has muscle protruding from the lateral portion of his left leg but fortunately the new area that they were concerned about at his last visit does not appear to have opened at this point. He is currently on Remicade infusions and seems to be doing better in my opinion in fact the wound itself seems to be overall much better. The purplish discoloration that he did have seems to have resolved and I think that is a good sign that hopefully the Remicade is doing its job. He does Niday, Emigdio ([id number]) have some biofilm noted over the surface of the wound. 11/01/2019 on evaluation today patient's wound actually appears to be doing excellent at this time. Fortunately there is no evidence of active infection and overall I feel like he is making great progress. The Remicade seems to be due excellent job in my opinion. 11/08/19 evaluation today vision actually appears to be doing quite well with regard to his weight ulcer. He's been tolerating dressing changes without complication. Fortunately there is no evidence of infection. No fevers, chills, nausea, or vomiting noted at this time. Overall states that is having more itching than pain which is actually a good sign in my opinion. 12/13/2019 upon evaluation today patient appears to be doing well today with regard to his wound. He has been tolerating the dressing changes without complication. Fortunately there is no sign of active infection at this time. No fevers, chills, nausea, vomiting, or diarrhea. Overall I feel like the infusion therapy has been very beneficial for him. 01/06/2020 on evaluation today patient appears to be doing well with regard to his wound. This is measuring smaller and actually looks to be doing better. Fortunately there is no signs of active infection at this point. No fevers, chills, nausea, vomiting, or diarrhea. With that being said he does still have the blue-green drainage but  this does not seem to be causing any significant issues currently. He has been using the gentamicin that does seem to be keeping things under decent control at this point. He goes later this morning for his next infusion therapy for the pyoderma which seems to also be very beneficial. 02/07/2020 on evaluation today patient appears to be doing about the same in regard to his wounds currently. Fortunately there is no signs of active infection systemically he does still have evidence of local infection still using gentamicin. He also is showing some signs of improvement albeit slowly I do feel like we are making some progress here. 02/21/2020 upon evaluation today patient appears to be making some signs of improvement the wound is measuring a little bit smaller which is great news and overall I am very pleased with where he stands currently. He is going to be having infusion therapy treatment on the 15th of this month. Fortunately there is no signs of active infection at this time. 03/13/2020 I do believe patient's wound is actually showing some signs of improvement here which is great news. He has continue with the infusion therapy through rheumatology/dermatology at [facility]. That does seem to be beneficial. I still think he  gets as much benefit from this as he did from the prednisone initially but nonetheless obviously this is less harsh on his body that the prednisone as far as they are concerned. 03/31/2020 on evaluation today patient's wound actually showing signs of some pretty good improvement in regard to the overall appearance of the wound bed. There is still muscle exposed though he does have some epithelial growth around the edges of the wound. Fortunately there is no signs of active infection at this time. No fevers, chills, nausea, vomiting, or diarrhea. 04/24/2020 upon evaluation today patient appears to be doing about the same in regard to his leg ulcer. He has been tolerating the  dressing changes without complication. Fortunately there is no signs of active infection at this time. No fevers, chills, nausea, vomiting, or diarrhea. With that being said he still has a lot of irritation from the bandaging around the edges of the wound. We did discuss today the possibility of a referral to plastic surgery. 05/22/2020 on evaluation today patient appears to be doing well with regard to his wounds all things considered. He has not been able to get the Chantix apparently there is a recall nurse that I was unaware of put out by [employer] involuntarily. Nonetheless for now I am and I have to do some research into what may be the best option for him to help with quitting in regard to smoking and we discussed that today. 06/26/2020 upon evaluation today patient appears to be doing well with regard to his wound from the standpoint of infection I do not see any signs of infection at this point. With that being said unfortunately he is still continuing to have issues with muscle exposure and again he is not having a whole lot of new skin growth unfortunately. There does not appear to be any signs of active infection at this time. No fevers, chills, nausea, vomiting, or diarrhea. 07/10/2020 upon evaluation today patient appears to be doing a little bit more poorly currently compared to where he was previous. I am concerned currently about an active infection that may be getting worse especially in light of the increased size and tenderness of the wound bed. No fevers, chills, nausea, vomiting, or diarrhea. 07/24/2020 upon evaluation today patient appears to be doing poorly in regard to his leg ulcer. He has been tolerating the dressing changes without complication but unfortunately is having a lot of discomfort. 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 Subsequently I think he is going require possibly IV antibiotics to get this  under control. I am very concerned about the severity of his infection and the amount of discomfort he is having. 07/31/2020 upon evaluation today patient appears to be doing about the same in regard to his leg wound. He did see Dr. Megan Salon and Dr. Megan Salon is actually going to start him on IV antibiotics. He goes for the PICC line tomorrow. With that being said there do not have that run for 2 weeks and then see how things are doing and depending on how he is progressing they may extend that a little longer. Nonetheless I am glad this is getting ready to be in place and definitely feel it may help the patient. In the meantime is been using mainly triamcinolone to the wound bed has an anti-inflammatory. 08/07/2020 on evaluation today patient appears to be doing well with regard to his wound compared even last week. In the interim he has gotten  the PICC line placed and overall this seems to be doing excellent. There does not appear to be any evidence of infection which is great news systemically although locally of course has had the infection this appears to be improving with the use of the antibiotics. 08/14/2020 upon evaluation today patient's wound actually showing signs of excellent improvement. Overall the irritation has significantly improved the drainage is back down to more of a normal level and his pain is really pretty much nonexistent compared to what it was. Obviously I think that this is significantly improved secondary to the IV antibiotic therapy which has made all the difference in the world. Again he had a resistant form of Pseudomonas for which oral antibiotics just was not cutting it. Nonetheless I do think that still we need to consider the possibility of a surgical closure for this wound is been open so long and to be honest with muscle exposed I think this can be very hard to get this to close outside of this although definitely were still working to try to do what we can in that  regard. 08/21/2020 upon evaluation today patient appears to be doing very well with regard to his wounds on the left lateral lower extremity/calf area. Fortunately there does not appear to be signs of active infection which is great news and overall very pleased with where things stand today. He is actually wrapping up his treatment with IV antibiotics tomorrow. After that we will see where things go from there. 08/28/2020 upon evaluation today patient appears to be doing decently well with regard to his leg ulcer. There does not appear to be any signs of Mainor, Cannon ([id number]) active infection which is great news and overall very pleased with where things stand today. No fevers, chills, nausea, vomiting, or diarrhea. 09/18/2020 upon evaluation today patient appears to be doing well with regard to his infection which I feel like is better. Unfortunately he is not doing as well with regard to the overall size of the wound which is not nearly as good at this point. I feel like that he may be having an issue here with the pyoderma being somewhat out of control. I think that he may benefit from potentially going back and talking to the dermatologist about what to do from the pyoderma standpoint. I am not certain if the infusions are helping nearly as much is what the prednisone did in the past. 10/02/2020 upon evaluation today patient appears to be doing well with regard to his leg ulcer. He did go to the Psychiatric nurse. Unfortunately they feel like there is a 10% chance that most that he would be able to heal and that the skin graft would take. Obviously this has led him to not be able to go down that path as far as treatment is concerned. Nonetheless he does seem to be doing a little bit better with the prednisone that I gave him last time. I think that he may need to discuss with dermatology the possibility of long-term prednisone as that seems to be what is most helpful for him to be perfectly honest. I  am not sure the Remicade is really doing the job. 10/17/2020 upon evaluation today patient appears to be doing a little better in regard to his wound. In fact the case has been since we did the prednisone on May 2 for him that we have noticed a little bit of improvement each time we have seen a size wise as well as  appearance wise as well as pain wise. I think the prednisone has had a greater effect then the infusion therapy has to be perfectly honest. With that being said the patient also feels significantly better compared to what he was previous. All of this is good news but nonetheless I am still concerned about the fact that again we are really not set up to long-term manage him as far as prednisone is concerned. Obviously there are things that you need to be watched I completely understand the risk of prednisone usage as well. 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 With all that being said I do believe that we can give him another round of the prednisone which she is requesting today because of the improvement that he seen since we did that first round. 10/30/2020 upon evaluation today patient's wound actually is showing signs of doing quite well. There does not appear to be any evidence of infection which is great news and overall very pleased with where things stand today. No fevers, chills, nausea, vomiting, or diarrhea. He tells me that the prednisone still has seem to have helped he wonders if we can extend that for just a little bit longer. He did not have the appointment with a dermatologist although he did have an infusion appointment last Friday. That was at [facility]. With that being said he tells me he could not do both that as well as the appointment with the physician on the same day therefore that is can have to be rescheduled. I really want to see if there is anything they feel like that could be done differently to try to help this out as  I am not really certain that the infusions are helping significantly here. 11/13/2020 upon evaluation today patient unfortunately appears to be doing somewhat poorly in regard to his wound I feel like this is actually worsening from the standpoint of the pyoderma spreading. I still feel like that he may need something different as far as trying to manage this going forward. Again we did the prednisone unfortunately his blood sugars are not doing so well because of this. Nonetheless I believe that the patient likely needs to try topical steroid. We have done triamcinolone for a while I think going with something stronger such as clobetasol could be beneficial again this is not something I do lightly I discussed this with the patient that again this does not normally put underneath an occlusive dressing. Nonetheless I think a thin film as such could help with some of the stronger anti-inflammatory effects. We discussed this today. He would like to try to give this a trial for the next couple weeks. I definitely think that is something that we can do. Evaluate7/03/2021 and today patient's wound bed actually showed signs of doing really about the same. There was a little expansion of the size of the wound and that leading edge that we done looking out although the clobetasol does seem to have slowed this down a bit in my opinion. There is just 1 small area that still seems to be progressing based on what I see. Nonetheless I am concerned about the fact this does not seem to be improving if anything seems to be doing a little bit worse. I do not know that the infusions are really helping him much as next infusion is August 5 his appointment with dermatology is July 25. Either way I really think that we  need to have a conversation potentially about this and I am actually going to see if I can talk with Dr. Lillia Carmel in order to see where things stand as well. Objective Constitutional Obese and  well-hydrated in no acute distress. Vitals Time Taken: 8:35 AM, Height: 71 in, Weight: 338 lbs, BMI: 47.1, Temperature: 98.5 F, Pulse: 81 bpm, Respiratory Rate: 18 breaths/min, Blood Pressure: 149/87 mmHg. Respiratory normal breathing without difficulty. Psychiatric this patient is able to make decisions and demonstrates good insight into disease process. Alert and Oriented x 3. pleasant and cooperative. General Notes: Upon inspection patient's wound bed again did not appear to be infected but it is still showing signs of expansion again I do not feel like the infusions are really doing much here for him. I Georgina Peer try to see about getting in touch with Dr. Lillia Carmel to see if there is anything else that can be contemplated here. Subsequently also discussed with him again getting the 2 layer compression which I think would be better from a stronger compression standpoint. He tells me he is got a call today to elastic therapy. Integumentary (Hair, Skin) Wound #1 status is Open. Original cause of wound was Gradually Appeared. The date acquired was: 11/18/2015. The wound has been in treatment 207 weeks. The wound is located on the Left,Lateral Lower Leg. The wound measures 7.5cm length x 7.2cm width x 1.2cm depth; 42.412cm^2 [name], Fergus ([id number]) area and 50.894cm^3 volume. There is muscle and Fat Layer (Subcutaneous Tissue) exposed. There is no tunneling or undermining noted. There is a large amount of purulent drainage noted. The wound margin is epibole. There is medium (34-66%) red, pink granulation within the wound bed. There is a medium (34-66%) amount of necrotic tissue within the wound bed including Adherent Slough. Assessment Active Problems ICD-10 Non-pressure chronic ulcer of left calf with fat layer exposed Pyoderma gangrenosum Non-pressure chronic ulcer of left ankle limited to breakdown of skin Venous insufficiency (chronic) (peripheral) Cellulitis of left lower limb Nicotine  dependence, unspecified, with other nicotine-induced disorders Plan Follow-up Appointments: Return Appointment in 2 weeks. Bathing/ Shower/ Hygiene: Clean wound with Normal Saline or wound cleanser. Edema Control - Lymphedema / Segmental Compressive Device / Other: Patient to wear own compression stockings. Remove compression stockings every night before going to bed and put on every morning when getting up. Other: - Call Elastic Therapy for knee high compression socks 20/71m/Hg Medications-Please add to medication list.: Other: - Clobetasol as prescribed, script sent to pharmacy of choice-apply thin to 7 o'clock area as directed every other day-stop with any increased pain WOUND #1: - Lower Leg Wound Laterality: Left, Lateral Cleanser: Soap and Water Every Other Day/30 Days Discharge Instructions: Gently cleanse wound with antibacterial soap, rinse and pat dry prior to dressing wounds Topical: Triamcinolone Acetonide Cream, 0.1%, 15 (g) tube Every Other Day/30 Days Discharge Instructions: Apply in office only to 7 o'clock area Secondary Dressing: Mepilex Border Flex, 6x6 (in/in) Every Other Day/30 Days Discharge Instructions: Apply to wound as directed. Do not cut. Secured With: Tubigrip Size E, 3.5x10 (in/yds) Every Other Day/30 Days Discharge Instructions: Apply double layer Tubigrip E 3-finger-widths below knee to base of toes to secure dressing and/or for swelling. 1. Would recommend currently that we continue with the wound care measures as before using some of the clobetasol over the leading edge that I showed him today where it seems to be expanding I do believe that has slowed this down a little bit. 2. I am also can recommend that  we can use a plain alginate dressing and this as well he has some at home I do think that can help with controlling some of the drainage. 3. He will continue with the border foam to cover and I do think that using a double layer compression for a 30  to 40 mmHg range would be beneficial for him he tells me he is going to call elastic therapy today. We will see patient back for reevaluation in 2 weeks here in the clinic. If anything worsens or changes patient will contact our office for additional recommendations. Electronic Signature(s) Signed: 11/27/2020 12:49:04 PM By: Worthy Keeler PA-C Entered By: Worthy Keeler on 11/27/2020 12:49:04 [name] ([id number]) -------------------------------------------------------------------------------- SuperBill Details Patient Name: [name] Date of Service: 11/27/2020 Medical Record Number: [id number] Patient Account Number: [id number] Date of Birth/Sex: 04-14-1981 (40 y.o. M) Treating RN: Dolan Amen Primary Care Provider: [name] Other Clinician: Referring Provider: [name] Treating Provider/Extender: [name] Cliche in Treatment: 207 Diagnosis Coding ICD-10 Codes Code Description [phone] Non-pressure chronic ulcer of left calf with fat layer exposed L88 Pyoderma gangrenosum L97.321 Non-pressure chronic ulcer of left ankle limited to breakdown of skin I87.2 Venous insufficiency (chronic) (peripheral) L03.116 Cellulitis of left lower limb F17.208 Nicotine dependence, unspecified, with other nicotine-induced disorders Facility Procedures CPT4 Code: [id number] Description: 99213 - WOUND CARE VISIT-LEV 3 EST PT Modifier: Quantity: 1 Physician Procedures CPT4 Code: 3552174 Description: 71595 - WC PHYS LEVEL 4 - EST PT Modifier: Quantity: 1 CPT4 Code: Description: ICD-10 Diagnosis Description L97.222 Non-pressure chronic ulcer of left calf with fat layer exposed L88 Pyoderma gangrenosum L97.321 Non-pressure chronic ulcer of left ankle limited to breakdown of skin I87.2 Venous insufficiency (chronic)  (peripheral) Modifier: Quantity: Electronic Signature(s) Signed: 11/27/2020 12:49:28 PM By: Worthy Keeler PA-C Entered By: Worthy Keeler on 11/27/2020  12:49:27

## 2020-11-28 ENCOUNTER — Encounter: Payer: Self-pay | Admitting: Primary Care

## 2020-11-28 ENCOUNTER — Ambulatory Visit (INDEPENDENT_AMBULATORY_CARE_PROVIDER_SITE_OTHER): Payer: 59 | Admitting: Primary Care

## 2020-11-28 VITALS — BP 128/64 | HR 101 | Temp 98.3°F | Ht 71.0 in | Wt 333.0 lb

## 2020-11-28 DIAGNOSIS — Z720 Tobacco use: Secondary | ICD-10-CM

## 2020-11-28 DIAGNOSIS — E119 Type 2 diabetes mellitus without complications: Secondary | ICD-10-CM | POA: Diagnosis not present

## 2020-11-28 LAB — POCT GLYCOSYLATED HEMOGLOBIN (HGB A1C): Hemoglobin A1C: 11.2 % — AB (ref 4.0–5.6)

## 2020-11-28 MED ORDER — BUPROPION HCL ER (SR) 100 MG PO TB12: 100.0000 mg | ORAL_TABLET | Freq: Two times a day (BID) | ORAL | 1 refills | Status: DC

## 2020-11-28 MED ORDER — FREESTYLE LIBRE 14 DAY SENSOR MISC: 1 refills | Status: DC

## 2020-11-28 MED ORDER — FREESTYLE LIBRE 14 DAY READER DEVI: 0 refills | Status: DC

## 2020-11-28 MED ORDER — TRULICITY 0.75 MG/0.5ML ~~LOC~~ SOAJ: 0.7500 mg | SUBCUTANEOUS | 1 refills | Status: DC

## 2020-11-28 MED ORDER — LANTUS SOLOSTAR 100 UNIT/ML ~~LOC~~ SOPN
40.0000 [IU] | PEN_INJECTOR | Freq: Every day | SUBCUTANEOUS | 0 refills | Status: DC
Start: 2020-11-28 — End: 2021-04-24

## 2020-11-28 NOTE — Patient Instructions (Signed)
We increased your Lantus to 40 units. Increase to 35 units for 3-4 days, then 40 units thereafter.  Please pick up Trulicity 9.48 mg from the pharmacy. Inject once weekly.  Start checking your blood sugar levels.  Appropriate times to check your blood sugar levels are:  -Before any meal (breakfast, lunch, dinner) -Two hours after any meal (breakfast, lunch, dinner) -Bedtime  Record your readings and send them to be via My Chart in 2-3 weeks.  Start bupropion SR 100 mg. Take 1 tablet by mouth once daily for 3-4 days, then increase to twice daily thereafter. This is for cigarette cravings.   Please schedule a follow up visit for 3 months.  It was a pleasure to see you today!

## 2020-11-28 NOTE — Telephone Encounter (Signed)
[name]: [id number] - PA Case ID: [id number] Need help? Call us at [phone] Outcome Approvedon February 19 Request Reference Number: [id number]. TRULICITY INJ 4.73/9.5 is approved through 07/08/2021. Your patient may now fill this prescription and it will be covered. Drug Trulicity 8.44BN/1.2HK pen-injectors Form [employer] Electronic Prior Authorization Form

## 2020-11-28 NOTE — Assessment & Plan Note (Signed)
Insurance will not cover Chantix or nicotine patches, they will cover bupropion SR.   Rx for bupropion SR 100 mg sent to pharmacy. Will start with once daily x 3-4 days, then BID thereafter.  He will update.

## 2020-11-28 NOTE — Assessment & Plan Note (Signed)
Uncontrolled today with A1C of 11.2, worse than last visit.   Never picked up Trulicity 3.95 mg this was approved and ready at pharmacy in February 2022, will renew Rx and have him pick up.  Increase Lantus to 35 units for 3-4 days, then to 40 units thereafter. Stressed the importance of checking glucose levels at least 2 times daily.   Rx for [employer] sent to pharmacy. Insurance should cover.   We will see him back in 3 months, he will send glucose readings via My Chart in 2-3 weeks.

## 2020-11-28 NOTE — Progress Notes (Signed)
Subjective:    Patient ID: [name], male    DOB: 1980/11/13, 40 y.o.   MRN: [id number]  HPI  [name] is a very pleasant 40 y.o. male with a history of type 2 diabetes, hypertension, ulcerative colitis, hyperlipidemia who presents today for follow up of diabetes and to discuss tobacco abuse.Marland Kitchen  1) Type 2 Diabetes:  Current medications include: Metformin 1000 mg BID, Lantus 32 units at bedtime, Trulicity 1.54 mg weekly.  He has yet to receive his Trulicity, has not checked back with [facility] since last visit.   He is checking his blood glucose 2-3 times weekly and is getting readings ranging mid 200's to mid 300's. He was on two weeks of prednisone a few weeks ago.   Last A1C: 10.3 in February 2022, 11.2 today  Last Eye Exam:  Last Foot Exam:  Pneumonia Vaccination: Urine Microalbumin: UTD Statin: atorvastatin 40 mg.   Dietary changes since last visit: None. Was on two weeks of prednisone.    Exercise: No regular exercise.   2) Tobacco Abuse: Currently smoking 1-2 PPD, has been smoking for the last 1-2 years. Is ready to quit. Insurance would not cover nicotine patches or Chantix. Insurance will cover bupropion SR.     Review of Systems  Respiratory:  Negative for shortness of breath.   Cardiovascular:  Negative for chest pain.  Endocrine: Positive for polydipsia.  Skin:  Positive for wound.  Neurological:  Negative for numbness.        Past Medical History:  Diagnosis Date   Blood in stool    Depression    Elevated blood pressure    Hyperlipidemia    OSA (obstructive sleep apnea) 2014   Seasonal allergies    Ulcerative colitis ([place]) 03/05/2018    Social History   Socioeconomic History   Marital status: Married    Spouse name: Not on file   Number of children: Not on file   Years of education: Not on file   Highest education level: Not on file  Occupational History   Not on file  Tobacco Use   Smoking status: Every Day    Packs/day: 1.00     Years: 14.00    Pack years: 14.00    Types: Cigarettes    Last attempt to quit: 11/25/2018    Years since quitting: 2.0   Smokeless tobacco: Former    Quit date: 03/17/2015  Vaping Use   Vaping Use: Never used  Substance and Sexual Activity   Alcohol use: Not Currently    Alcohol/week: 0.0 standard drinks    Comment: soical   Drug use: Not on file   Sexual activity: Not on file  Other Topics Concern   Not on file  Social History Narrative   Married.   7 kids.   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   Enjoys target shooting.    Social Determinants of Health   Financial Resource Strain: Not on file  Food Insecurity: Not on file  Transportation Needs: Not on file  Physical Activity: Not on file  Stress: Not on file  Social Connections: Not on file  Intimate Partner Violence: Not on file    Past Surgical History:  Procedure Laterality Date   SEPTOPLASTY  2007   WRIST SURGERY      Family History  Problem Relation Age of Onset   Alcohol abuse Paternal Aunt    Alcohol abuse Paternal Uncle    Stroke Paternal Uncle    Hyperlipidemia Maternal  Grandfather    Hypertension Maternal Grandfather    Diabetes Maternal Grandfather    Alcohol abuse Maternal Grandfather    Multiple sclerosis Mother    Dementia Mother     Allergies  Allergen Reactions   Biaxin [Clarithromycin] Other (See Comments)    Causes colitis flares   Milk-Related Compounds Hives    Current Outpatient Medications on File Prior to Visit  Medication Sig Dispense Refill   acetaminophen (TYLENOL) 500 MG tablet Take 1,000 mg by mouth every 6 (six) hours as needed for headache (pain).     atorvastatin (LIPITOR) 40 MG tablet Take 1 tablet (40 mg total) by mouth daily. For cholesterol. 90 tablet 3   blood glucose meter kit and supplies KIT Dispense based on patient and insurance preference. Use up to four times daily as directed. (FOR ICD-9 250.00, 250.01). 1 each 0   clobetasol ointment (TEMOVATE) 7.84 % Apply  1 application topically 2 (two) times daily.     ibuprofen (ADVIL,MOTRIN) 200 MG tablet Take 400-600 mg by mouth every 6 (six) hours as needed for headache (pain).     inFLIXimab (REMICADE) 100 MG injection Inject into the muscle once a week.     LANTUS SOLOSTAR 100 UNIT/ML Solostar Pen INJECT 30 UNITS SUBCUTANEOUSLY AT BEDTIME 15 mL 0   metFORMIN (GLUCOPHAGE) 1000 MG tablet TAKE 1 TABLET BY MOUTH TWICE DAILY WITH MEALS FOR  DIABETES 180 tablet 0   mupirocin ointment (BACTROBAN) 2 % Apply topically two (2) 60g times a day. Mix with clobetasol and apply twice daily     RELION PEN NEEDLES 31G X 6 MM MISC USE 1  NIGHTLY WITH INSULIN AS DIRECTED 90 each 1   SANTYL ointment Apply topically.     triamcinolone ointment (KENALOG) 0.1 % Apply topically.     venlafaxine XR (EFFEXOR-XR) 150 MG 24 hr capsule Take 1 capsule (150 mg total) by mouth daily with breakfast. For depression. 90 capsule 3   Dulaglutide (TRULICITY) 6.96 EX/5.2WU SOPN Inject 0.75 mg into the skin once a week. For diabetes. (Patient not taking: Reported on 11/28/2020) 2 mL 3   varenicline (CHANTIX) 1 MG tablet Take 1/2 tablet daily for three days, then 1/2 tablet twice daily for five days, then 1 tablet twice daily thereafter for smoking cessation. (Patient not taking: Reported on 11/28/2020) 60 tablet 0   No current facility-administered medications on file prior to visit.    BP 128/64   Pulse (!) 101   Temp 98.3 F (36.8 C) (Temporal)   Ht 5' 11"  (1.803 m)   Wt (!) 333 lb (151 kg)   SpO2 98%   BMI 46.44 kg/m  Objective:   Physical Exam Cardiovascular:     Rate and Rhythm: Normal rate and regular rhythm.  Pulmonary:     Effort: Pulmonary effort is normal.     Breath sounds: Normal breath sounds. No wheezing or rales.  Musculoskeletal:     Cervical back: Neck supple.  Skin:    General: Skin is warm and dry.  Neurological:     Mental Status: He is alert and oriented to person, place, and time.          Assessment &  Plan:      This visit occurred during the SARS-CoV-2 public health emergency.  Safety protocols were in place, including screening questions prior to the visit, additional usage of staff PPE, and extensive cleaning of exam room while observing appropriate contact time as indicated for disinfecting solutions.

## 2020-11-29 ENCOUNTER — Other Ambulatory Visit: Payer: Self-pay

## 2020-12-11 ENCOUNTER — Other Ambulatory Visit
Admission: RE | Admit: 2020-12-11 | Discharge: 2020-12-11 | Disposition: A | Discharge status: Home / Self Care | Payer: 59 | Source: Ambulatory Visit | Attending: Physician Assistant | Admitting: Physician Assistant

## 2020-12-11 ENCOUNTER — Other Ambulatory Visit: Payer: Self-pay

## 2020-12-11 ENCOUNTER — Encounter: Payer: 59 | Admitting: Physician Assistant

## 2020-12-11 ENCOUNTER — Other Ambulatory Visit: Payer: Self-pay | Admitting: Primary Care

## 2020-12-11 DIAGNOSIS — E119 Type 2 diabetes mellitus without complications: Secondary | ICD-10-CM

## 2020-12-11 DIAGNOSIS — B999 Unspecified infectious disease: Secondary | ICD-10-CM | POA: Insufficient documentation

## 2020-12-11 DIAGNOSIS — L03116 Cellulitis of left lower limb: Secondary | ICD-10-CM | POA: Diagnosis present

## 2020-12-11 DIAGNOSIS — E11622 Type 2 diabetes mellitus with other skin ulcer: Secondary | ICD-10-CM | POA: Diagnosis not present

## 2020-12-11 NOTE — Progress Notes (Addendum)
EMMAUS, BRANDI ([id number]) Visit Report for 12/11/2020 Chief Complaint Document Details Patient Name: [name], [name] Date of Service: 12/11/2020 8:15 AM Medical Record Number: [id number] Patient Account Number: [id number] Date of Birth/Sex: [birth year] (40 y.o. 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 M) Treating RN: Donnamarie Poag Primary Care Provider: [name] Other Clinician: Referring Provider: [name] Treating Provider/Extender: [name] Cliche in Treatment: 209 History of Present Illness HPI Description: 12/04/16; 40 year old man who comes into the clinic today for review of a wound on the posterior left calf. He tells me that is been there for about a year. He is not a diabetic he does smoke half a pack per day. He was seen in the ER on 11/20/16 felt to have cellulitis around the wound and was given clindamycin. An x-ray did not show osteomyelitis. The patient initially tells me that he has a milk allergy that sets off a pruritic itching rash on his lower legs which she scratches incessantly and he thinks that's what may have set up the wound. He has been using various topical antibiotics and ointments without any effect. He works in a [employer] and is on his feet all day. He does not have a prior history  of wounds however he does have the rash on both lower legs the right arm and the ventral aspect of his left arm. These are excoriations and clearly have had scratching however there are of macular looking areas on both legs including a substantial larger area on the right leg. This does not have an underlying open area. There is no blistering. The patient tells me that 2 years ago in [place] in response to the rash on his legs he saw a dermatologist who told him he had a condition which may be pyoderma gangrenosum although I may be putting words into his mouth. He seemed to recognize this. 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 He is not in any treatment for this. He's had no recent travel 12/11/16; the patient arrives today with his wound and roughly the same condition we've been using silver alginate this is a deep punched out wound with some surrounding erythema but no tenderness. Biopsy I did did not show confirmed pyoderma gangrenosum suggested nonspecific inflammation and vasculitis but does not provide an actual description of what was seen by the pathologist. I'm really not able to understand this We have also received information from the patient's dermatologist in [place] notes from April 2016. This was a doctor Agarwal-antal. 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 He was prescribed topical steroid high potency under occlusion which helped but at this point the patient did not have a deep punched out wound. 12/18/16; the patient's wound is larger in terms of surface area however this surface looks better and there is less depth. The surrounding erythema also is better. The patient states that the wrap we put on came off 2 days ago when he has been using his compression stockings.  He we are in the process of getting a dermatology consult. 12/26/16 on evaluation today patient's left lower extremity wound shows evidence of infection with  surrounding erythema noted. He has been tolerating the dressing changes but states that he has noted more discomfort. There is a larger area of erythema surrounding the wound. No fevers, chills, nausea, or vomiting noted at this time. With that being said the wound still does have slough covering the surface. He is not allergic to any medication that he is aware of at this point. In regard to his right lower extremity he had several regions that are erythematous and pruritic he wonders if there's anything we can do to help that. 01/02/17 I reviewed patient's wound culture which was obtained his visit last week. He was placed on doxycycline at that point. Unfortunately that does not appear to be an antibiotic that would likely help with the situation however the pseudomonas noted on culture is sensitive to Cipro. Also unfortunately patient's wound seems to have a large compared to last week's evaluation. Not severely so but there are definitely increased measurements in general. He is continuing to have discomfort as well he writes this to be a seven out of 10. In fact he would prefer me not to perform any debridement today due to the fact that he is having discomfort and considering he has an active infection on the little reluctant to do so anyway. No fevers, chills, nausea, or vomiting noted at this time. 01/08/17; patient seems dermatology on September 5. I suspect dermatology will want the slides from the biopsy I did sent to their pathologist. I'm not sure if there is a way we can expedite that. In any case the culture I did before I left on vacation 3 weeks ago showed Pseudomonas he was given 10 days of Cipro and per her description of her intake nurses is actually somewhat better this week although the wound is quite a bit bigger than I remember the last time I saw this. He still has 3 more days of Cipro 01/21/17; dermatology appointment tomorrow. He has completed the ciprofloxacin for Pseudomonas.  Surface of the wound looks better however he is had some deterioration in the lesions on his right leg. Meantime the left lateral leg wound we will continue with sample 01/29/17; patient had his dermatology appointment but I can't yet see that note. He is completed his antibiotics. The wound is more superficial but considerably larger in circumferential area than when he came in. This is in his left lateral calf. He also has swollen erythematous areas with superficial wounds on the right leg and small papular areas on both arms. There apparently areas in her his upper thighs and buttocks I did not look at those. Dermatology biopsied the right leg. Hopefully will have their input next week. 02/05/17; patient went back to see his dermatologist who told him that he had a "scratching problem" as well as staph. He is now on a 30 day course of doxycycline and I believe she gave him triamcinolone cream to the right leg areas to help with the itching [not exactly sure but probably triamcinolone]. She apparently looked at the left lateral leg wound although this was not rebiopsied and I think felt to be ultimately part of the same pathogenesis. He is using sample border foam and changing nevus himself. He now has a new open area on the right posterior leg which was his biopsy site I don't have any of the dermatology notes  02/12/17; we put the patient in compression last week with SANTYL to the wound on the left leg and the biopsy. Edema is much better and the depth of the wound is now at level of skin. Area is still the same oBiopsy site on the right lateral leg we've also been using santyl with a border foam dressing and he is changing this himself. 02/19/17; Using silver alginate started last week to both the substantial left leg wound and the biopsy site on the right wound. He is tolerating compression well. Has a an appointment with his primary M.D. tomorrow wondering about diuretics although I'm wondering if  the edema problem is actually lymphedema 02/26/17; the patient has been to see his primary doctor Dr. Jerrel Ivory at [facility] our primary care. She started him on Lasix 20 mg and this seems to have helped with the edema. However we are not making substantial change with the left lateral calf wound and inflammation. The biopsy site on the right leg also looks stable but not really all that different. 03/12/17; the patient has been to see vein and vascular Dr. Lucky Cowboy. He has had venous reflux studies I have not reviewed these. I did get a call from his dermatology office. They felt that he might have pathergy based on their biopsy on his right leg which led them to look at the slides of ALVEY, BROCKEL ([id number]) the biopsy I did on the left leg and they wonder whether this represents pyoderma gangrenosum which was the original supposition in a man with ulcerative colitis albeit inactive for many years. They therefore recommended clobetasol and tetracycline i.e. 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 03/26/17; apparently the patient just had reflux studies not an appointment with Dr. dew. She arrives in clinic today having applied clobetasol for 2-3 weeks. He notes over the last 2-3 days excessive drainage having to change the dressing 3-4 times a day and also expanding erythema. 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 Lillia Carmel at [facility] dermatology locally who kindly saw him at my request. 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 My concern was the degree of erythema medially extending from his left leg wound which was either inflammation from pyoderma or cellulitis. I  put him on Augmentin however culture of the wound showed Pseudomonas which is quinolone sensitive. I really don't believe he has cellulitis however in view of everything I will continue and give him a course of Cipro. He is also on doxycycline as an immune modulator for the pyoderma. In addition to his original wound on the left lateral leg with surrounding erythema he has a wound on the right posterior calf which was an original biopsy site done by dermatology. 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 Saw Dr. Lillia Carmel yesterday. He has been using topical antibiotics to both wound areas his original wound on the left and the biopsies/pathergy area on the right. There is definitely some improvement in the inflammation around the wound on the right although the patient states he has increasing sensitivity of the wounds. He is on prednisone 60 and doxycycline 1 as prescribed by Dr. Lillia Carmel. He is covering the topical antibiotic with gauze  and putting this in his own compression stocks and changing this daily. He states that Dr. Lottie Rater did a culture of the left leg wound yesterday 05/07/17; pyoderma gangrenosum. The patient saw Dr. Lillia Carmel yesterday and has a follow-up with her in one month. He is still using topical antibiotics to both wounds although he can't recall exactly what type. He is still on prednisone 60 mg. Dr. Lillia Carmel stated that the doxycycline could stop if we were in agreement. 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 The right medial leg was induced by biopsy/pathergy. The area on the right is essentially healed. 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 The right medial leg has closed and remains closed. He is still on  prednisone 60. oHe tells me he missed his last dermatology appointment with Dr. Lillia Carmel but will make another appointment. He reports that her blood sugar at a recent screen in [place] was high 200's. He was 180 today. 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 I think he is going to require still much longer prednisone perhaps another 3 months before attempting to taper. In the meantime his wound is a lot better. Smaller. He is cleaning this off daily and applying topical antibiotics. When he was last in the clinic I thought about changing to [facility] and actually put in a couple of calls to dermatology although probably not during their business hours. 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 He continues on oral prednisone. He has been using triple antibiotic ointment. There is surface debris and we will transition to [facility] and have him return in 2 weeks. He has lost 30 pounds since his last appointment with lifestyle modification. He may benefit from topical steroid cream for treatment this can be considered at a later date. 08/22/17 on evaluation today patient appears to actually be doing rather well in regard to his left lateral lower extremity ulcer. He has actually been managed by Dr. Dellia Nims most recently. Patient is currently on oral steroids at this time. This seems to have been of benefit for him. Nonetheless his last visit was actually with Leah on 08/06/17. Currently he is not utilizing any topical steroid creams although this could be of benefit as well. No fevers, chills, nausea, or vomiting noted at this time. 09/05/17 on evaluation today patient appears to be doing better in regard to his left lateral lower extremity ulcer. He has been tolerating the dressing changes without complication. He is using Santyl with good effect. Overall I'm very  pleased with how things are standing at this point. Patient likewise is happy that this is doing better. 09/19/17 on evaluation today patient actually appears to be doing rather well in regard to his left lateral lower extremity ulcer. 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 He's not having any significant pain. 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 He has been tolerating the Santyl without complication and in general I feel like he's making good progress. 10/17/17 on evaluation today patient  appears to be doing very well in regard to his left lateral lower surety ulcer. He has been tolerating the dressing changes without complication. There does not appear to be any evidence of infection he's alternating the Santyl and the triple antibiotic ointment every other day this seems to be doing well for him. 11/03/17 on evaluation today patient appears to be doing very well in regard to his left lateral lower extremity ulcer. He is been tolerating the dressing changes without complication which is good news. Fortunately there does not appear to be any evidence of infection which is also great news. Overall is doing excellent they are starting to taper down on the prednisone is down to 40 mg at this point it also started topical clobetasol for him. 11/17/17 on evaluation today patient appears to be doing well in regard to his left lateral lower surety ulcer. He's been tolerating the dressing changes without complication. He does note that he is having no pain, no excessive drainage or discharge, and overall he feels like things are going about how he would expect and hope they would. Overall he seems to have no evidence of infection at this time in my opinion which is good news. 12/04/17-He is seen in follow-up evaluation  for right lateral lower extremity ulcer. He has been applying topical steroid cream. Today's measurement show slight increase in size. Over the next 2 weeks we will transition to every other day Santyl and steroid cream. He has been encouraged to monitor for changes and notify clinic with any concerns 12/15/17 on evaluation today patient's left lateral motion the ulcer and fortunately is doing worse again at this point. This just since last week to this week has close to doubled in size according to the patient. I did not seeing last week's I do not have a visual to compare this to in our system was also down so we do not have all the charts and at this point. Nonetheless it does have me somewhat concerned in regard to the fact that again he was worried enough about it he has contact the dermatology that placed them back on the full strength, 50 mg a day of the prednisone that he was taken previous. He continues to alternate using clobetasol along with Santyl at this point. He is obviously somewhat frustrated. 12/22/17 on evaluation today patient appears to be doing a little worse compared to last evaluation. Unfortunately the wound is a little deeper and slightly larger than the last week's evaluation. With that being said he has made some progress in regard to the irritation surrounding at this time unfortunately despite that progress that's been made he still has a significant issue going on here. 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 [name], [name] ([id number]) For that reason I discussed with him today the possibility of perform a wound culture to ensure there's nothing overtly infected. 01/06/18 on evaluation today patient's wound is larger and deeper than previously evaluated. With that being said it did appear that his wound was infected after  my last evaluation with him. Subsequently I did end up prescribing a prescription for Bactrim DS which she has been taking and having no complication with. Fortunately there does not appear to be any evidence of infection at this point in time as far as anything spreading, no want to touch, and overall I feel like  things are showing signs of improvement. 01/13/18 on evaluation today patient appears to be even a little larger and deeper than last time. There still muscle exposed in the base of the wound. Nonetheless he does appear to be less erythematous I do believe inflammation is calming down also believe the infection looks like it's probably resolved at this time based on what I'm seeing. No fevers, chills, nausea, or vomiting noted at this time. 01/30/18 on evaluation today patient actually appears to visually look better for the most part. Unfortunately those visually this looks better he does seem to potentially have what may be an abscess in the muscle that has been noted in the central portion of the wound. This is the first time that I have noted what appears to be fluctuance in the central portion of the muscle. With that being said I'm somewhat more concerned about the fact that this might indicate an abscess formation at this location. I do believe that an ultrasound would be appropriate. This is likely something we need to try to do as soon as possible. He has been switch to mupirocin ointment and he is no longer using the steroid ointment as prescribed by dermatology he sees them again next week he's been decreased from 60 to 40 mg of prednisone. 03/09/18 on evaluation today patient actually appears to be doing a little better compared to last time I saw him. There's not as much erythema surrounding the wound itself. He I did review his most recent infectious disease note which was dated 02/24/18. He saw Dr. Michel Bickers in [place]. 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 Patient is now off of antibiotics and they are continually observing this. There seems to be no change in the past two weeks in my pinion based on what the patient says and what I see today compared to what Dr. Megan Salon likely saw two weeks ago. No fevers, chills, nausea, or vomiting noted at this time. 03/23/18 on evaluation today patient's wound actually appears to be showing signs of improvement which is good news. He is currently still on the Dapsone. He is also working on tapering the prednisone to get off of this and Dr. Lottie Rater is working with him in this regard. Nonetheless overall I feel like the wound is doing well it does appear based on the infectious disease note that I reviewed from Dr. 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 I did also review the results of his ultrasound of left lower extremity which revealed there was a dentist tissue in the base of the wound without an abscess noted. 04/06/18 on evaluation today the patient's left lateral lower extremity ulcer actually appears to be doing fairly well which is excellent news. There does not appear to be any evidence of infection at this time which is also great news. Overall he still does have a significantly large ulceration although little by little he seems to be making progress. He is down to 10 mg a day of the prednisone. 04/20/18 on evaluation today patient actually appears to be doing excellent at this time in regard to his left lower extremity ulcer. He's making signs of good progress unfortunately this is taking much longer than we would really like to see but nonetheless he is  making progress. Fortunately there does not appear to be any evidence of infection at this time. No fevers, chills, nausea, or vomiting noted at this time.  The patient has not been using the Santyl due to the cost he hadn't got in this field yet. He's mainly been using the antibiotic ointment topically. Subsequently he also tells me that he really has not been scrubbing in the shower I think this would be helpful again as I told him it doesn't have to be anything too aggressive to even make it believe just enough to keep it free of some of the loose slough and biofilm on the wound surface. 05/11/18 on evaluation today patient's wound appears to be making slow but sure progress in regard to the left lateral lower extremity ulcer. He is been tolerating the dressing changes without complication. Fortunately there does not appear to be any evidence of infection at this time. He is still just using triple antibiotic ointment along with clobetasol occasionally over the area. He never got the Santyl and really does not seem to intend to in my pinion. 06/01/18 on evaluation today patient appears to be doing a little better in regard to his left lateral lower extremity ulcer. He states that overall he does not feel like he is doing as well with the Dapsone as he did with the prednisone. Nonetheless he sees his dermatologist later today and is gonna talk to them about the possibility of going back on the prednisone. Overall again I believe that the wound would be better if you would utilize Santyl but he really does not seem to be interested in going back to the [name] at this point. He has been using triple antibiotic ointment. 06/15/18 on evaluation today patient's wound actually appears to be doing about the same at this point. Fortunately there is no signs of infection at this time. He has made slight improvements although he continues to not really want to clean the wound bed at this point. He states that he just doesn't mess with it he doesn't want to cause any problems with everything else he has going on. He has been on medication, antibiotics as prescribed  by his dermatologist, for a staff infection of his lower extremities which is really drying out now and looking much better he tells me. Fortunately there is no sign of overall infection. 06/29/18 on evaluation today patient appears to be doing well in regard to his left lateral lower surety ulcer all things considering. Fortunately his staff infection seems to be greatly improved compared to previous. He has no signs of infection and this is drying up quite nicely. He is still the doxycycline for this is no longer on cental, Dapsone, or any of the other medications. His dermatologist has recommended possibility of an infusion but right now he does not want to proceed with that. 07/13/18 on evaluation today patient appears to be doing about the same in regard to his left lateral lower surety ulcer. Fortunately there's no signs of infection at this time which is great news. Unfortunately he still builds up a significant amount of Slough/biofilm of the surface of the wound he still is not really cleaning this as he should be appropriately. Again I'm able to easily with saline and gauze remove the majority of this on the surface which if you would do this at home would likely be a dramatic improvement for him as far as getting the area to improve. Nonetheless overall I still feel like he  is making progress is just very slow. I think Santyl will be of benefit for him as well. Still he has not gotten this as of this point. 07/27/18 on evaluation today patient actually appears to be doing little worse in regards of the erythema around the periwound region of the wound he also tells me that he's been having more drainage currently compared to what he was experiencing last time I saw him. He states not quite as bad as what he had because this was infected previously but nonetheless is still appears to be doing poorly. Fortunately there is no evidence of systemic infection at this point. The patient tells me that  he is not going to be able to afford the Santyl. He is still waiting to hear about the infusion therapy with his dermatologist. Apparently she wants an updated colonoscopy first. 08/10/18 on evaluation today patient appears to be doing better in regard to his left lateral lower extremity ulcer. Fortunately he is showing signs of improvement in this regard he's actually been approved for Remicade infusion's as well although this has not been scheduled as of yet. Fortunately there's no signs of active infection at this time in regard to the wound although he is having some issues with infection of the right lower extremity is been seen as dermatologist for this. Fortunately they are definitely still working with him trying to keep things under control. [name], [name] ([id number]) 09/07/18 on evaluation today patient is actually doing rather well in regard to his left lateral lower extremity ulcer. He notes these actually having some hair grow back on his extremity which is something he has not seen in years. He also tells me that the pain is really not giving them any trouble at this time which is also good news overall she is very pleased with the progress he's using a combination of the mupirocin along with the probate is all mixed. 09/21/18 on evaluation today patient actually appears to be doing fairly well all things considered in regard to his looks from the ulcer. He's been tolerating the dressing changes without complication. Fortunately there's no signs of active infection at this time which is good news he is still on all antibiotics or prevention of the staff infection. He has been on prednisone for time although he states it is gonna contact his dermatologist and see if she put them on a short course due to some irritation that he has going on currently. Fortunately there's no evidence of any overall worsening this is going very slow I think cental would be something that would be helpful for him  although he states that $50 for tube is quite expensive. He therefore is not willing to get that at this point. 10/06/18 on evaluation today patient actually appears to be doing decently well in regard to his left lateral leg ulcer. He's been tolerating the dressing changes without complication. Fortunately there's no signs of active infection at this time. Overall I'm actually rather pleased with the progress he's making although it's slow he doesn't show any signs of infection and he does seem to be making some improvement. I do believe that he may need a switch up and dressings to try to help this to heal more appropriately and quickly. 10/19/18 on evaluation today patient actually appears to be doing better in regard to his left lateral lower extremity ulcer. This is shown signs of having much less Slough buildup at this point due to the fact he has been using  the Santyl. Obviously this is very good news. The overall size of the wound is not dramatically smaller but again the appearance is. 11/02/18 on evaluation today patient actually appears to be doing quite well in regard to his lower [place] ulcer. A lot of the skin around the ulcer is actually somewhat irritating at this point this seems to be more due to the dressing causing irritation from the adhesive that anything else. Fortunately there is no signs of active infection at this time. 11/24/18 on evaluation today patient appears to be doing a little worse in regard to his overall appearance of his lower extremity ulcer. There's more erythema and warmth around the wound unfortunately. He is currently on doxycycline which he has been on for some time. With that being said I'm not sure that seems to be helping with what appears to possibly be an acute cellulitis with regard to his left lower extremity ulcer. No fevers, chills, nausea, or vomiting noted at this time. 12/08/18 on evaluation today patient's wounds actually appears to be doing  significantly better compared to his last evaluation. He has been using Santyl along with alternating tripling about appointment as well as the steroid cream seems to be doing quite well and the wound is showing signs of improvement which is excellent news. Fortunately there's no evidence of infection and in fact his culture came back negative with only normal skin flora noted. 12/21/2018 upon evaluation today patient actually appears to be doing excellent with regard to his ulcer. This is actually the best that I have seen it since have been helping to take care of him. It is both smaller as well as less slough noted on the surface of the wound and seems to be showing signs of good improvement with new skin growing from the edges. He has been using just the triamcinolone he does wonder if he can get a refill of that ointment today. 01/04/2019 upon evaluation today patient actually appears to be doing well with regard to his left lateral lower extremity ulcer. With that being said it does not appear to be that he is doing quite as well as last time as far as progression is concerned. There does not appear to be any signs of infection or significant irritation which is good news. With that being said I do believe that he may benefit from switching to a collagen based dressing based on how clean The wound appears. 01/18/2019 on evaluation today patient actually appears to be doing well with regard to his wound on the left lower extremity. He is not made a lot of progress compared to where we were previous but nonetheless does seem to be doing okay at this time which is good news. There is no signs of active infection which is also good news. My only concern currently is I do wish we can get him into utilizing the collagen dressing his insurance would not pay for the supplies that we ordered although it appears that he may be able to order this through his supply company that he typically utilizes. This is  [employer]. Nonetheless he did try to order it during the office visit today and it appears this did go through. We will see if he can get that it is a different brand but nonetheless he has collagen and I do think will be beneficial. 02/01/2019 on evaluation today patient actually appears to be doing a little worse today in regard to the overall size of his wounds. Fortunately there  is no signs of active infection at this time. That is visually. Nonetheless when this is happened before it was due to infection. For that reason were somewhat concerned about that this time as well. 02/08/2019 on evaluation today patient unfortunately appears to be doing slightly worse with regard to his wound upon evaluation today. Is measuring a little deeper and a little larger unfortunately. I am not really sure exactly what is causing this to enlarge he actually did see his dermatologist she is going to see about initiating Humira for him. Subsequently she also did do steroid injections into the wound itself in the periphery. Nonetheless still nonetheless he seems to be getting a little bit larger he is gone back to just using the steroid cream topically which I think is appropriate. I would say hold off on the collagen for the time being is definitely a good thing to do. Based on the culture results which we finally did get the final result back regarding it shows staph as the bacteria noted again that can be a normal skin bacteria based on the fact however he is having increased drainage and worsening of the wound measurement wise I would go ahead and place him on an antibiotic today I do believe for this. 02/15/2019 on evaluation today patient actually appears to be doing somewhat better in regard to his ulcer. There is no signs of worsening at this time I did review his culture results which showed evidence of Staphylococcus aureus but not MRSA. Again this could just be more related to the normal skin bacteria  although he states the drainage has slowed down quite a bit he may have had a mild infection not just colonization. And was much smaller and then since around10/04/2019 on evaluation today patient appears to be doing unfortunately worse as far as the size of the wound. I really feel like that this is steadily getting larger again it had been doing excellent right at the beginning of September we have seen a steady increase in the area of the wound it is almost 2-1/2 times the size it was on September 1. Obviously this is a bad trend this is not wanting to see. For that reason we went back to using just the topical triamcinolone cream which does seem to help with inflammation. I checked him for bacteria by way of culture and nothing showed positive there. I am considering giving him a short course of a tapering steroid Dosepak today to see if that is can be beneficial for him. The patient is in agreement with giving that a try. 03/08/2019 on evaluation today patient appears to be doing very well in comparison to last evaluation with regard to his lower extremity ulcer. This is showing signs of less inflammation and actually measuring slightly smaller compared to last time every other week over the past month and a half he has been measuring larger larger larger. Nonetheless I do believe that the issue has been inflammation the prednisone does seem to [name], [name] ([id number]) have been beneficial for him which is good news. No fevers, chills, nausea, vomiting, or diarrhea. 03/22/2019 on evaluation today patient appears to be doing about the same with regard to his leg ulcer. He has been tolerating the dressing changes without complication. With that being said the wound seems to be mostly arrested at its current size but really is not making any progress except for when we prescribed the prednisone. He did show some signs of dropping as far as  the overall size of the wound during that interval week.  Nonetheless this is something he is not on long-term at this point and unfortunately I think he is getting need either this or else the Humira which his dermatologist has discussed try to get approval for. With that being said he will be seeing his dermatologist on the 11th of this month that is November. 04/19/2019 on evaluation today patient appears to be doing really about the same the wound is measuring slightly larger compared to last time I saw him. He has not been into the office since November 2 due to the fact that he unfortunately had Covid as that his entire family. He tells me that it was rough but they did pull-through and he seems to be doing much better. Fortunately there is no signs of active infection at this time. No fevers, chills, nausea, vomiting, or diarrhea. 05/10/2019 on evaluation today patient unfortunately appears to be doing significantly worse as compared to last time I saw him. He does tell me that he has had his first dose of Humira and actually is scheduled to get the next one in the upcoming week. With that being said he tells me also that in the past several days he has been having a lot of issues with green drainage she showed me a picture this is more blue-green in color. He is also been having issues with increased sloughy buildup and the wound does appear to be larger today. Obviously this is not the direction that we want everything to take based on the starting of his Humira. Nonetheless I think this is definitely a result of likely infection and to be honest I think this is probably Pseudomonas causing the infection based on what I am seeing. 05/24/2019 on evaluation today patient unfortunately appears to be doing significantly worse compared to his prior evaluation with me 2 weeks ago. I did review his culture results which showed that he does have Staph aureus as well as Pseudomonas noted on the culture. Nonetheless the Levaquin that I prescribed for him does  not appear to have been appropriate and in fact he tells me he is no longer experiencing the green drainage and discharge that he had at the last visit. Fortunately there is no signs of active infection at this time which is good news although the wound has significantly worsened it in fact is much deeper than it was previous. We have been utilizing up to this point triamcinolone ointment as the prescription topical of choice but at this time I really feel like that the wound is getting need to be packed in order to appropriately manage this due to the deeper nature of the wound. Therefore something along the lines of an alginate dressing may be more appropriate. 05/31/2019 upon inspection today patient's wound actually showed signs of doing poorly at this point. Unfortunately he just does not seem to be making any good progress despite what we have tried. He actually did go ahead and pick up the Cipro and start taking that as he was noticing more green drainage he had previously completed the Levaquin that I prescribed for him as well. Nonetheless he missed his appointment for the seventh last week on Wednesday with the wound care center and [facility] where his dermatologist referred him. Obviously I do think a second opinion would be helpful at this point especially in light of the fact that the patient seems to be doing so poorly despite the fact  that we have tried everything that I really know how at this point. The only thing that ever seems to have helped him in the past is when he was on high doses of continual steroids that did seem to make a difference for him. 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 06/07/2019 upon evaluation today patient unfortunately appears to be doing worse yet again with regard to his wound. In fact I am  starting to question whether or not he may have a fluid pocket in the muscle at this point based on the bulging and the soft appearance to the central portion of the muscle area. There is not anything draining from the muscle itself at this time which is good news but nonetheless the wound is expanding. I am not really seeing any results of the Humira as far as overall wound progression based on what I am seeing at this point. The patient has been referred for second opinion with regard to his wound to the [facility] wound care center by his dermatologist which I definitely am not in opposition to. Unfortunately we tried multiple dressings in the past including collagen, alginate, and at one point even Hydrofera Blue. With that being said he is never really used it for any significant amount of time due to the fact that he often complains of pain associated with these dressings and then will go back to either using the Santyl which she has done intermittently or more frequently the triamcinolone. He is also using his own compression stockings. We have wrapped him in the past but again that was something else that he really was not a big fan of. 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 He seemed to do much better when he was on the steroids although honestly I understand that the steroids are not necessarily the best medication to be on long-term obviously 06/14/2019 on evaluation today patient appears to be doing actually a little bit better with regard to the overall appearance with his leg. Unfortunately he does continue to have issues with what appears to be some fluid underneath the  muscle although he did see the wound specialty center at [facility] last week their main goals were to see about infusion therapy in place of the Humira as they feel like that is not quite strong enough. They also recommended that we continue with the treatment otherwise as we are they felt like that was appropriate and they are okay with him continuing to follow-up here with [place] in that regard. With that being said they are also sending him to the vein specialist there to see about vein stripping and if that would be of benefit for him. Subsequently they also did not really address whether or not an ultrasound of the muscle area to see if there is anything that needs to be addressed here would be appropriate or not. For that reason I discussed this with him last week I think we  may proceed down that road at this point. 06/21/2019 upon evaluation today patient's wound actually appears to be doing slightly better compared to previous evaluations. I do believe that he has made a difference with regard to the progression here with the use of oral steroids. Again in the past has been the only thing that is really calm things down. He does tell me that from [facility] is gotten a good news from there that there are no further vein stripping that is necessary at this point. I do not have that available for review today although the patient did relay this to me. He also did obtain and have the ultrasound of the wound completed which I did sign off on today. It does appear that there is no fluid collection under the muscle this is likely then just edematous tissue in general. That is also good news. Overall I still believe the inflammation is the main issue here. He did inquire about the possibility of a wound VAC again with the muscle protruding like it is I am not really sure whether the wound VAC is necessarily ideal or not. That is something we will have to consider although I do believe he may need compression wrapping to  try to help with edema control which could potentially be of benefit. 06/28/2019 on evaluation today patient appears to be doing slightly better measurement wise although this is not terribly smaller he least seems to be trending towards that direction. With that being said he still seems to have purulent drainage noted in the wound bed at this time. He has been on Levaquin followed by Cipro over the past month. Unfortunately he still seems to have some issues with active infection at this time. I did perform a culture last week in order to evaluate and see if indeed there was still anything going on. Subsequently the culture did come back showing Pseudomonas which is consistent with the drainage has been having which is blue-green in color. He also has had an odor that again was somewhat consistent with Pseudomonas as well. Long story short it appears that the culture showed an intermediate finding with regard to how well the Cipro will work for the Pseudomonas infection. Subsequently being that he does not seem to be clearing up and at best what we are doing is just keeping this at [place] I think he may need to see infectious disease to discuss IV antibiotic options. BALEN, WOOLUM ([id number]) 07/05/2019 upon evaluation today patient appears to be doing okay in regard to his leg ulcer. He has been tolerating the dressing changes at this point without complication. Fortunately there is no signs of active infection at this time which is good news. No fevers, chills, nausea, vomiting, or diarrhea. With that being said he does have an appointment with infectious disease tomorrow and his primary care on Wednesday. Again the reason for the infectious disease referral was due to the fact that he did not seem to be fully resolving with the use of oral antibiotics and therefore we were thinking that IV antibiotic therapy may be necessary secondary to the fact that there was an intermediate finding for  how effective the Cipro may be. Nonetheless again he has been having a lot of purulent and even green drainage. Fortunately right now that seems to have calmed down over the past week with the reinitiation of the oral antibiotic. Nonetheless we will see what Dr. Megan Salon has to say. 07/12/2019 upon evaluation today patient appears to be  doing about the same at this point in regard to his left lower extremity ulcer. Fortunately there is no signs of active infection at this time which is good news I do believe the Levaquin has been beneficial I did review Dr. Hale Bogus note and to be honest I agree that the patient's leg does appear to be doing better currently. What we found in the past as he does not seem to really completely resolve he will stop the antibiotic and then subsequently things will revert back to having issues with blue-green drainage, increased pain, and overall worsening in general. Obviously that is the reason I sent him back to infectious disease. 07/19/2019 upon evaluation today patient appears to be doing roughly the same in size there is really no dramatic improvement. He has started back on the Levaquin at this point and though he seems to be doing okay he did still have a lot of blue/green drainage noted on evaluation today unfortunately. I think that this is still indicative more likely of a Pseudomonas infection as previously noted and again he does see Dr. Megan Salon in just a couple of days. I do not know that were really able to effectively clear this with just oral antibiotics alone based on what I am seeing currently. Nonetheless we are still continue to try to manage as best we can with regard to the patient and his wound. I do think the wrap was helpful in decreasing the edema which is excellent news. No fevers, chills, nausea, vomiting, or diarrhea. 07/26/2019 upon evaluation today patient appears to be doing slightly better with regard to the overall appearance of the muscle  there is no dark discoloration centrally. Fortunately there is no signs of active infection at this time. No fevers, chills, nausea, vomiting, or diarrhea. Patient's wound bed currently the patient did have an appointment with Dr. Megan Salon at infectious disease last week. With that being said Dr. Megan Salon the patient states was still somewhat hesitant about put him on any IV antibiotics he wanted [place] to repeat cultures today and then see where things go going forward. He does look like Dr. Megan Salon because of some improvement the patient did have with the Levaquin wanted [place] to see about repeating cultures. If it indeed grows the Pseudomonas again then he recommended a possibility of considering a PICC line placement and IV antibiotic therapy. He plans to see the patient back in 1 to 2 weeks. 08/02/2019 upon evaluation today patient appears to be doing poorly with regard to his left lower extremity. We did get the results of his culture back it shows that he is still showing evidence of Pseudomonas which is consistent with the purulent/blue-green drainage that he has currently. Subsequently the culture also shows that he now is showing resistance to the oral fluoroquinolones which is unfortunate as that was really the only thing to treat the infection prior. I do believe that he is looking like this is going require IV antibiotic therapy to get this under control. Fortunately there is no signs of systemic infection at this time which is good news. The patient does see Dr. Megan Salon tomorrow. 08/09/2019 upon evaluation today patient appears to be doing better with regard to his left lower extremity ulcer in regard to the overall appearance. He is currently on IV antibiotic therapy. As ordered by Dr. Megan Salon. Currently the patient is on ceftazidime which she is going to take for the next 2 weeks and then follow-up for 4 to 5-week appointment with Dr. Megan Salon.  The patient started this this past Friday  symptoms have not for a total of 3 days currently in full. 08/16/2019 upon evaluation today patient's wound actually does show muscle in the base of the wound but in general does appear to be much better as far as the overall evidence of infection is concerned. In fact I feel like this is for the most part cleared up he still on the IV antibiotics he has not completed the full course yet but I think he is doing much better which is excellent news. 08/23/2019 upon evaluation today patient appears to be doing about the same with regard to his wound at this point. He tells me that he still has pain unfortunately. Fortunately there is no evidence of systemic infection at this time which is great news. There is significant muscle protrusion. 09/13/19 upon evaluation today patient appears to be doing about the same in regard to his leg unfortunately. He still has a lot of drainage coming from the ulceration there is still muscle exposed. With that being said the patient's last wound culture still showed an intermediate finding with regard to the Pseudomonas he still having the bluish/green drainage as well. Overall I do not know that the wound has completely cleared of infection at this point. Fortunately there is no signs of active infection systemically at this point which is good news. 09/20/2019 upon evaluation today patient's wound actually appears to be doing about the same based on what I am seeing currently. I do not see any signs of systemic infection he still does have evidence of some local infection and drainage. He did see Dr. Megan Salon last week and Dr. Megan Salon states that he probably does need a different IV antibiotic although he does not want to put him on this until the patient begins the Remicade infusion which is actually scheduled for about 10 days out from today on 13 May. Following that time Dr. Megan Salon is good to see him back and then will evaluate the feasibility of starting him on the  IV antibiotic therapy once again at that point. I do not disagree with this plan I do believe as Dr. Megan Salon stated in his note that I reviewed today that the patient's issue is multifactorial with the pyoderma being 1 aspect of this that were hoping the Remicade will be helpful for her. In the meantime I think the gentamicin is, helping to keep things under decent okay control in regard to the ulcer. 09/27/2019 upon evaluation today patient appears to be doing about the same with regard to his wound still there is a lot of muscle exposure though he does have some hyper granulation tissue noted around the edge and actually some granulation tissue starting to form over the muscle which is actually good news. Fortunately there is no evidence of active infection which is also good news. His pain is less at this point. 5/21; this is a patient I have not seen in a long time. 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 He has a large wound on the left lateral leg with protruding muscle. He comes in the clinic today showing the same area on his left medial ankle. He says there is been a spot there for some time although we have not previously defined this. Today he has a clearly defined area with slight amount of skin breakdown surrounded by raised areas with a purplish hue in color. This is not painful he says it is irritated.  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 He still has muscle protruding from the lateral portion of his left leg but fortunately the new area that they were concerned about at his last visit does not appear to have opened at this point. He is currently on Remicade infusions and seems to be doing better in my opinion in fact the wound itself seems to be overall much better. The purplish discoloration that he did have seems to have resolved  and I think that is a good sign that hopefully the Remicade is doing its job. He does have some biofilm noted over the surface of the wound. 11/01/2019 on evaluation today patient's wound actually appears to be doing excellent at this time. Fortunately there is no evidence of active infection and overall I feel like he is making great progress. The Remicade seems to be due excellent job in my opinion. [name], [name] ([id number]) 11/08/19 evaluation today vision actually appears to be doing quite well with regard to his weight ulcer. He's been tolerating dressing changes without complication. Fortunately there is no evidence of infection. No fevers, chills, nausea, or vomiting noted at this time. Overall states that is having more itching than pain which is actually a good sign in my opinion. 12/13/2019 upon evaluation today patient appears to be doing well today with regard to his wound. He has been tolerating the dressing changes without complication. Fortunately there is no sign of active infection at this time. No fevers, chills, nausea, vomiting, or diarrhea. Overall I feel like the infusion therapy has been very beneficial for him. 01/06/2020 on evaluation today patient appears to be doing well with regard to his wound. This is measuring smaller and actually looks to be doing better. Fortunately there is no signs of active infection at this point. No fevers, chills, nausea, vomiting, or diarrhea. With that being said he does still have the blue-green drainage but this does not seem to be causing any significant issues currently. He has been using the gentamicin that does seem to be keeping things under decent control at this point. He goes later this morning for his next infusion therapy for the pyoderma which seems to also be very beneficial. 02/07/2020 on evaluation today patient appears to be doing about the same in regard to his wounds currently. Fortunately there is no signs of active infection  systemically he does still have evidence of local infection still using gentamicin. He also is showing some signs of improvement albeit slowly I do feel like we are making some progress here. 02/21/2020 upon evaluation today patient appears to be making some signs of improvement the wound is measuring a little bit smaller which is great news and overall I am very pleased with where he stands currently. He is going to be having infusion therapy treatment on the 15th of this month. Fortunately there is no signs of active infection at this time. 03/13/2020 I do believe patient's wound is actually showing some signs of improvement here which is great news. He has continue with the infusion therapy through [facility]ology[facility]dermatology at [facility]. That does seem to be beneficial. I still think he gets as much benefit from this as he did from the prednisone initially but nonetheless obviously this is less harsh on his body that the prednisone as far as they are concerned. 03/31/2020 on evaluation today patient's wound actually showing signs of some pretty good improvement in regard  to the overall appearance of the wound bed. There is still muscle exposed though he does have some epithelial growth around the edges of the wound. Fortunately there is no signs of active infection at this time. No fevers, chills, nausea, vomiting, or diarrhea. 04/24/2020 upon evaluation today patient appears to be doing about the same in regard to his leg ulcer. He has been tolerating the dressing changes without complication. Fortunately there is no signs of active infection at this time. No fevers, chills, nausea, vomiting, or diarrhea. With that being said he still has a lot of irritation from the bandaging around the edges of the wound. We did discuss today the possibility of a referral to plastic surgery. 05/22/2020 on evaluation today patient appears to be doing well with regard to his wounds all things considered. He has not been able  to get the Chantix apparently there is a recall nurse that I was unaware of put out by [employer] involuntarily. Nonetheless for now I am and I have to do some research into what may be the best option for him to help with quitting in regard to smoking and we discussed that today. 06/26/2020 upon evaluation today patient appears to be doing well with regard to his wound from the standpoint of infection I do not see any signs of infection at this point. With that being said unfortunately he is still continuing to have issues with muscle exposure and again he is not having a whole lot of new skin growth unfortunately. There does not appear to be any signs of active infection at this time. No fevers, chills, nausea, vomiting, or diarrhea. 07/10/2020 upon evaluation today patient appears to be doing a little bit more poorly currently compared to where he was previous. I am concerned currently about an active infection that may be getting worse especially in light of the increased size and tenderness of the wound bed. No fevers, chills, nausea, vomiting, or diarrhea. 07/24/2020 upon evaluation today patient appears to be doing poorly in regard to his leg ulcer. He has been tolerating the dressing changes without complication but unfortunately is having a lot of discomfort. 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 Subsequently I think he is going require possibly IV antibiotics to get this under control. I am very concerned about the severity of his infection and the amount of discomfort he is having. 07/31/2020 upon evaluation today patient appears to be doing about the same in regard to his leg wound. He did see Dr. Megan Salon and Dr. Megan Salon is actually going to start him on IV antibiotics. He goes for the PICC line tomorrow. With that being said there do not have that run for 2 weeks and then see how things are doing and depending on how he  is progressing they may extend that a little longer. Nonetheless I am glad this is getting ready to be in place and definitely feel it may help the patient. In the meantime is been using mainly triamcinolone to the wound bed has an anti-inflammatory. 08/07/2020 on evaluation today patient appears to be doing well with regard to his wound compared even last week. In the interim he has gotten the PICC line placed and overall this seems to be doing excellent. There does not appear to be any evidence of infection which is great news systemically although locally of course has had the infection this appears to be improving with the use of the antibiotics. 08/14/2020 upon  evaluation today patient's wound actually showing signs of excellent improvement. Overall the irritation has significantly improved the drainage is back down to more of a normal level and his pain is really pretty much nonexistent compared to what it was. Obviously I think that this is significantly improved secondary to the IV antibiotic therapy which has made all the difference in the world. Again he had a resistant form of Pseudomonas for which oral antibiotics just was not cutting it. Nonetheless I do think that still we need to consider the possibility of a surgical closure for this wound is been open so long and to be honest with muscle exposed I think this can be very hard to get this to close outside of this although definitely were still working to try to do what we can in that regard. 08/21/2020 upon evaluation today patient appears to be doing very well with regard to his wounds on the left lateral lower extremity/calf area. Fortunately there does not appear to be signs of active infection which is great news and overall very pleased with where things stand today. He is actually wrapping up his treatment with IV antibiotics tomorrow. After that we will see where things go from there. 08/28/2020 upon evaluation today patient appears  to be doing decently well with regard to his leg ulcer. There does not appear to be any signs of active infection which is great news and overall very pleased with where things stand today. No fevers, chills, nausea, vomiting, or diarrhea. 09/18/2020 upon evaluation today patient appears to be doing well with regard to his infection which I feel like is better. Unfortunately he is not doing as well with regard to the overall size of the wound which is not nearly as good at this point. I feel like that he may be having an issue here with the pyoderma being somewhat out of control. I think that he may benefit from potentially going back and talking to the dermatologist about [name], [name] ([id number]) what to do from the pyoderma standpoint. I am not certain if the infusions are helping nearly as much is what the prednisone did in the past. 10/02/2020 upon evaluation today patient appears to be doing well with regard to his leg ulcer. He did go to the Psychiatric nurse. Unfortunately they feel like there is a 10% chance that most that he would be able to heal and that the skin graft would take. Obviously this has led him to not be able to go down that path as far as treatment is concerned. Nonetheless he does seem to be doing a little bit better with the prednisone that I gave him last time. I think that he may need to discuss with dermatology the possibility of long-term prednisone as that seems to be what is most helpful for him to be perfectly honest. I am not sure the Remicade is really doing the job. 10/17/2020 upon evaluation today patient appears to be doing a little better in regard to his wound. In fact the case has been since we did the prednisone on May 2 for him that we have noticed a little bit of improvement each time we have seen a size wise as well as appearance wise as well as pain wise. I think the prednisone has had a greater effect then the infusion therapy has to be perfectly honest. With  that being said the patient also feels significantly better compared to what he was previous. All of this is good news but  nonetheless I am still concerned about the fact that again we are really not set up to long-term manage him as far as prednisone is concerned. Obviously there are things that you need to be watched I completely understand the risk of prednisone usage as well. 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 With all that being said I do believe that we can give him another round of the prednisone which she is requesting today because of the improvement that he seen since we did that first round. 10/30/2020 upon evaluation today patient's wound actually is showing signs of doing quite well. There does not appear to be any evidence of infection which is great news and overall very pleased with where things stand today. No fevers, chills, nausea, vomiting, or diarrhea. He tells me that the prednisone still has seem to have helped he wonders if we can extend that for just a little bit longer. He did not have the appointment with a dermatologist although he did have an infusion appointment last Friday. That was at [facility]. With that being said he tells me he could not do both that as well as the appointment with the physician on the same day therefore that is can have to be rescheduled. I really want to see if there is anything they feel like that could be done differently to try to help this out as I am not really certain that the infusions are helping significantly here. 11/13/2020 upon evaluation today patient unfortunately appears to be doing somewhat poorly in regard to his wound I feel like this is actually worsening from the standpoint of the pyoderma spreading. I still feel like that he may need something different as far as trying to manage this going forward. Again we did the prednisone unfortunately his blood sugars are not doing so well  because of this. Nonetheless I believe that the patient likely needs to try topical steroid. We have done triamcinolone for a while I think going with something stronger such as clobetasol could be beneficial again this is not something I do lightly I discussed this with the patient that again this does not normally put underneath an occlusive dressing. Nonetheless I think a thin film as such could help with some of the stronger anti-inflammatory effects. We discussed this today. He would like to try to give this a trial for the next couple weeks. I definitely think that is something that we can do. Evaluate7/03/2021 and today patient's wound bed actually showed signs of doing really about the same. There was a little expansion of the size of the wound and that leading edge that we done looking out although the clobetasol does seem to have slowed this down a bit in my opinion. There is just 1 small area that still seems to be progressing based on what I see. Nonetheless I am concerned about the fact this does not seem to be improving if anything seems to be doing a little bit worse. I do not know that the infusions are really helping him much as next infusion is August 5 his appointment with dermatology is July 25. Either way I really think that we need to have a conversation potentially about this and I am actually going to see if I can talk with Dr. Lillia Carmel in order to see where things stand as well. 12/11/2020 upon evaluation today patient appears to be doing worse in regard to his leg ulcer. Unfortunately  I just do not think this is making the progress that I would like to see at this point. Honestly he does have an appointment with dermatology and this is in 2 days. I am wondering what they may have to offer to help with this. Right now what I am seeing is that he is continuing to show signs of worsening little by little. Obviously that is not great at all. Is the exact opposite of what we are  looking for. Electronic Signature(s) Signed: 12/11/2020 10:36:08 AM By: Worthy Keeler PA-C Entered By: Worthy Keeler on 12/11/2020 10:36:07 [name], [name] ([id number]) -------------------------------------------------------------------------------- Physical Exam Details Patient Name: [name] Date of Service: 12/11/2020 8:15 AM Medical Record Number: [id number] Patient Account Number: [id number] Date of Birth/Sex: 1980-11-08 (39 y.o. M) Treating RN: Donnamarie Poag Primary Care Provider: [name] Other Clinician: Referring Provider: [name] Treating Provider/Extender: [name] Cliche in Treatment: 209 Constitutional Obese and well-hydrated in no acute distress. Respiratory normal breathing without difficulty. Psychiatric this patient is able to make decisions and demonstrates good insight into disease process. Alert and Oriented x 3. pleasant and cooperative. Notes Upon inspection patient's wound bed actually showed signs of infection which again I am concerned about at this point. I do not see any evidence of systemic infection though locally I feel like there is cellulitis surrounding the wound this has not been present I been keeping a close eye on this this seems to be worse and different today compared even what it was last time I saw him. Unfortunately the wound is also showing signs of worsening in general which is not good. Electronic Signature(s) Signed: 12/11/2020 10:36:42 AM By: Worthy Keeler PA-C Entered By: Worthy Keeler on 12/11/2020 10:36:42 [name], [name] ([id number]) -------------------------------------------------------------------------------- Physician Orders Details Patient Name: [name] Date of Service: 12/11/2020 8:15 AM Medical Record Number: [id number] Patient Account Number: [id number] Date of Birth/Sex: 09-28-1980 (39 y.o. M) Treating RN: Dolan Amen Primary Care Provider: [name] Other Clinician: Referring Provider:  [name] Treating Provider/Extender: [name] Cliche in Treatment: 209 Verbal / Phone Orders: No Diagnosis Coding ICD-10 Coding Code Description [phone] Non-pressure chronic ulcer of left calf with fat layer exposed L88 Pyoderma gangrenosum L97.321 Non-pressure chronic ulcer of left ankle limited to breakdown of skin I87.2 Venous insufficiency (chronic) (peripheral) L03.116 Cellulitis of left lower limb F17.208 Nicotine dependence, unspecified, with other nicotine-induced disorders Follow-up Appointments o Return Appointment in 1 week. Bathing/ Shower/ Hygiene o Clean wound with Normal Saline or wound cleanser. Edema Control - Lymphedema / Segmental Compressive Device / Other o Patient to wear own compression stockings. Remove compression stockings every night before going to bed and put on every morning when getting up. o Other: - Call [facility] [facility] for knee high compression socks 20/6m/Hg Medications-Please add to medication list. o Other: - Clobetasol as prescribed, script sent to pharmacy of choice-apply thin to 7 o'clock area as directed every other day-stop with any increased pain Wound Treatment Wound #1 - Lower Leg Wound Laterality: Left, Lateral Cleanser: Soap and Water Every Other Day/30 Days Discharge Instructions: Gently cleanse wound with antibacterial soap, rinse and pat dry prior to dressing wounds Topical: Triamcinolone Acetonide Cream, 0.1%, 15 (g) tube Every Other Day/30 Days Discharge Instructions: Apply in office only to 7 o'clock area Secondary Dressing: Mepilex Border Flex, 6x6 (in/in) Every Other Day/30 Days Discharge Instructions: Apply to wound as directed. Do not cut. Secured With: Tubigrip Size E, 3.5x10 (in/yds) Every Other Day/30 Days Discharge Instructions: Apply double  layer Tubigrip E 3-finger-widths below knee to base of toes to secure dressing and/or for swelling. Laboratory o Bacteria identified in Wound by Culture  (MICRO) - Left lower leg oooo LOINC Code: 4268-3 oooo Convenience Name: Wound culture routine Electronic Signature(s) Signed: 12/11/2020 4:44:39 PM By: Worthy Keeler PA-C Signed: 12/11/2020 4:47:50 PM By: Dolan Amen RN Entered By: Dolan Amen on 12/11/2020 08:56:22 XAN, INGRAHAM ([id number]) DABNEY, SCHANZ ([id number]) -------------------------------------------------------------------------------- Problem List Details Patient Name: [name] Date of Service: 12/11/2020 8:15 AM Medical Record Number: [id number] Patient Account Number: [id number] Date of Birth/Sex: 07-09-1980 (40 y.o. 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 10/08/2019 No Yes I87.2 Venous insufficiency (chronic) (peripheral) 12/04/2016 No Yes L03.116 Cellulitis of left lower limb 05/24/2019 No Yes F17.208 Nicotine dependence, unspecified, with other nicotine-induced disorders 04/24/2020 No Yes Inactive Problems ICD-10 Code Description Active Date Inactive Date L97.213 Non-pressure chronic ulcer of right calf with necrosis of muscle 04/02/2017 04/02/2017 Resolved Problems Electronic Signature(s) Signed: 12/11/2020 8:30:43 AM By: Worthy Keeler PA-C Entered By: Worthy Keeler on 12/11/2020 08:30:42 [place]ve, Gerry ([id number]) -------------------------------------------------------------------------------- Progress Note Details Patient Name: [name] Date of Service: 12/11/2020 8:15 AM Medical Record Number: [id number] Patient Account Number: [id number] Date of Birth/Sex: 08/30/1980 (39  y.o. 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 He tells me that is been there for about a year. He is not a diabetic he does smoke half a pack per day. He was seen in the ER on 11/20/16 felt to have cellulitis around the wound and was given clindamycin. An x-ray did not show osteomyelitis. The patient initially tells me that he has a milk allergy that sets off a pruritic itching rash on his lower legs which she scratches incessantly and he thinks that's what may have set up the wound. He has been using various topical antibiotics and ointments without any effect. He works in a [employer] and is on his feet all day. He does not have a prior history of wounds however he does have the rash on both lower legs the right arm and the ventral aspect of his left arm. These are excoriations and clearly have had scratching however there are of macular looking areas on both legs including a substantial larger area on the right leg. This does not have an underlying open area. There is no blistering. The patient tells me that 2 years ago in [place] in response to the rash on his legs he saw a dermatologist who told him he had a condition which may be pyoderma gangrenosum although I may be putting words into his mouth. He seemed to recognize this. 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 He is not in any treatment for this. He's had no recent travel 12/11/16; the patient arrives today  with his wound and roughly the same condition we've been using silver alginate this is a  deep punched out wound with some surrounding erythema but no tenderness. Biopsy I did did not show confirmed pyoderma gangrenosum suggested nonspecific inflammation and vasculitis but does not provide an actual description of what was seen by the pathologist. I'm really not able to understand this We have also received information from the patient's dermatologist in [place] notes from April 2016. This was a doctor Agarwal-antal. 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 He was prescribed topical steroid high potency under occlusion which helped but at this point the patient did not have a deep punched out wound. 12/18/16; the patient's wound is larger in terms of surface area however this surface looks better and there is less depth. The surrounding erythema also is better. The patient states that the wrap we put on came off 2 days ago when he has been using his compression stockings. He we are in the process of getting a dermatology consult. 12/26/16 on evaluation today patient's left lower extremity wound shows evidence of infection with surrounding erythema noted. He has been tolerating the dressing changes but states that he has noted more discomfort. There is a larger area of erythema surrounding the wound. No fevers, chills, nausea, or vomiting noted at this time. With that being said the wound still does have slough covering the surface. He is not allergic to any medication that he is aware of at this point. In regard to his right lower extremity he had several regions that are erythematous and pruritic he wonders if there's anything we can do to help that. 01/02/17 I reviewed patient's wound culture which was obtained his visit last week. He was placed on doxycycline at that point. Unfortunately that does not appear to be an antibiotic that would likely help with the situation however the pseudomonas noted on culture is sensitive to Cipro. Also unfortunately patient's wound seems to  have a large compared to last week's evaluation. Not severely so but there are definitely increased measurements in general. He is continuing to have discomfort as well he writes this to be a seven out of 10. In fact he would prefer me not to perform any debridement today due to the fact that he is having discomfort and considering he has an active infection on the little reluctant to do so anyway. No fevers, chills, nausea, or vomiting noted at this time. 01/08/17; patient seems dermatology on September 5. I suspect dermatology will want the slides from the biopsy I did sent to their pathologist. I'm not sure if there is a way we can expedite that. In any case the culture I did before I left on vacation 3 weeks ago showed Pseudomonas he was given 10 days of Cipro and per her description of her intake nurses is actually somewhat better this week although the wound is quite a bit bigger than I remember the last time I saw this. He still has 3 more days of Cipro 01/21/17; dermatology appointment tomorrow. He has completed the ciprofloxacin for Pseudomonas. Surface of the wound looks better however he is had some deterioration in the lesions on his right leg. Meantime the left lateral leg wound we will continue with sample 01/29/17; patient had his dermatology appointment but I can't yet see that note. He is completed his antibiotics. The wound is more superficial but considerably larger in circumferential area than when he came in. This is in his left lateral  calf. He also has swollen erythematous areas with superficial wounds on the right leg and small papular areas on both arms. There apparently areas in her his upper thighs and buttocks I did not look at those. Dermatology biopsied the right leg. Hopefully will have their input next week. 02/05/17; patient went back to see his dermatologist who told him that he had a "scratching problem" as well as staph. He is now on a 30 day course of doxycycline and I  believe she gave him triamcinolone cream to the right leg areas to help with the itching [not exactly sure but probably triamcinolone]. She apparently looked at the left lateral leg wound although this was not rebiopsied and I think felt to be ultimately part of the same pathogenesis. He is using sample border foam and changing nevus himself. He now has a new open area on the right posterior leg which was his biopsy site I don't have any of the dermatology notes 02/12/17; we put the patient in compression last week with SANTYL to the wound on the left leg and the biopsy. Edema is much better and the depth of the wound is now at level of skin. Area is still the same Biopsy site on the right lateral leg we've also been using santyl with a border foam dressing and he is changing this himself. 02/19/17; Using silver alginate started last week to both the substantial left leg wound and the biopsy site on the right wound. He is tolerating compression well. Has a an appointment with his primary M.D. tomorrow wondering about diuretics although I'm wondering if the edema problem is actually lymphedema [name], [name] ([id number]) 02/26/17; the patient has been to see his primary doctor Dr. Jerrel Ivory at [facility] our primary care. She started him on Lasix 20 mg and this seems to have helped with the edema. However we are not making substantial change with the left lateral calf wound and inflammation. The biopsy site on the right leg also looks stable but not really all that different. 03/12/17; the patient has been to see vein and vascular Dr. Lucky Cowboy. He has had venous reflux studies I have not reviewed these. I did get a call from his dermatology office. They felt that he might have pathergy based on their biopsy on his right leg which led them to look at the slides of the biopsy I did on the left leg and they wonder whether this represents pyoderma gangrenosum which was the original supposition in a man  with ulcerative colitis albeit inactive for many years. They therefore recommended clobetasol and tetracycline i.e. 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 03/26/17; apparently the patient just had reflux studies not an appointment with Dr. dew. She arrives in clinic today having applied clobetasol for 2-3 weeks. He notes over the last 2-3 days excessive drainage having to change the dressing 3-4 times a day and also expanding erythema. 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 Lillia Carmel at [facility] dermatology locally who kindly saw him at my request. 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 My concern was the degree of erythema medially extending from his left leg wound which was either inflammation from pyoderma or cellulitis. I put  him on Augmentin however culture of the wound showed Pseudomonas which is quinolone sensitive. I really don't believe he has cellulitis however in view of everything I will continue and give him a course of Cipro. He is also on doxycycline as an immune modulator for the pyoderma. In addition to his original wound on the left lateral leg with surrounding erythema he has a wound on the right posterior calf which was an original biopsy site done by dermatology. 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 Saw Dr. Lillia Carmel yesterday. He has been using topical antibiotics to both wound areas his original wound on the left and the biopsies/pathergy area on the right. There is definitely some improvement in the inflammation around the wound on the right although the patient states he has increasing  sensitivity of the wounds. He is on prednisone 60 and doxycycline 1 as prescribed by Dr. Lillia Carmel. He is covering the topical antibiotic with gauze and putting this in his own compression stocks and changing this daily. He states that Dr. Lottie Rater did a culture of the left leg wound yesterday 05/07/17; pyoderma gangrenosum. The patient saw Dr. Lillia Carmel yesterday and has a follow-up with her in one month. He is still using topical antibiotics to both wounds although he can't recall exactly what type. He is still on prednisone 60 mg. Dr. Lillia Carmel stated that the doxycycline could stop if we were in agreement. 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 The right medial leg was induced by biopsy/pathergy. The area on the right is essentially healed. 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 The right medial leg has closed and remains closed. He is still on prednisone 60. He tells me he missed his last dermatology appointment with Dr. Lillia Carmel but will make another appointment. He reports that her blood sugar at a recent screen in [place] was high 200's. He was 180 today. 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 I think he is going to require still much longer prednisone perhaps another 3 months before attempting to taper. In the meantime his wound is a lot better. Smaller. He is cleaning this off daily and applying topical antibiotics. When he was last in the clinic I thought about changing to [facility] and actually put in a couple of calls to dermatology although probably not during their business hours. 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 He continues on  oral prednisone. He has been using triple antibiotic ointment. There is surface debris and we will transition to [facility] and have him return in 2 weeks. He has lost 30 pounds since his last appointment with lifestyle modification. He may benefit from topical steroid cream for treatment this can be considered at a later date. 08/22/17 on evaluation today patient appears to actually be doing rather well in regard to his left lateral lower extremity ulcer. He has actually been managed by Dr. Dellia Nims most recently. Patient is currently on oral steroids at this time. This seems to have been of benefit for him. Nonetheless his last visit was actually with Leah on 08/06/17. Currently he is not utilizing any topical steroid creams although this could be  of benefit as well. No fevers, chills, nausea, or vomiting noted at this time. 09/05/17 on evaluation today patient appears to be doing better in regard to his left lateral lower extremity ulcer. He has been tolerating the dressing changes without complication. He is using Santyl with good effect. Overall I'm very pleased with how things are standing at this point. Patient likewise is happy that this is doing better. 09/19/17 on evaluation today patient actually appears to be doing rather well in regard to his left lateral lower extremity ulcer. 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 He's not having any significant pain. 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 He has been tolerating the Santyl without complication and in general I feel like he's making good progress. 10/17/17 on evaluation today patient appears to be doing very well in regard to his left lateral lower surety ulcer. He has been tolerating the dressing changes without complication. There does not appear to be any  evidence of infection he's alternating the Santyl and the triple antibiotic ointment every other day this seems to be doing well for him. 11/03/17 on evaluation today patient appears to be doing very well in regard to his left lateral lower extremity ulcer. He is been tolerating the dressing changes without complication which is good news. Fortunately there does not appear to be any evidence of infection which is also great news. Overall is doing excellent they are starting to taper down on the prednisone is down to 40 mg at this point it also started topical clobetasol for him. 11/17/17 on evaluation today patient appears to be doing well in regard to his left lateral lower surety ulcer. He's been tolerating the dressing changes without complication. He does note that he is having no pain, no excessive drainage or discharge, and overall he feels like things are going about how he would expect and hope they would. Overall he seems to have no evidence of infection at this time in my opinion which is good news. 12/04/17-He is seen in follow-up evaluation for right lateral lower extremity ulcer. He has been applying topical steroid cream. Today's measurement show slight increase in size. Over the next 2 weeks we will transition to every other day Santyl and steroid cream. He has been encouraged to monitor for changes and notify clinic with any concerns 12/15/17 on evaluation today patient's left lateral motion the ulcer and fortunately is doing worse again at this point. This just since last week to this week has close to doubled in size according to the patient. I did not seeing last week's I do not have a visual to compare this to in our system was also down so we do not have all the charts and at this point. Nonetheless it does have me somewhat concerned in regard to the fact that again he was worried enough about it he has contact the dermatology that placed them back on the full strength, 50 mg a day of  the prednisone that he was taken previous. He continues to alternate using clobetasol along with Santyl at this point. He is obviously somewhat frustrated. JAYTON, [name] ([id number]) 12/22/17 on evaluation today patient appears to be doing a little worse compared to last evaluation. Unfortunately the wound is a little deeper and slightly larger than the last week's evaluation. With that  being said he has made some progress in regard to the irritation surrounding at this time unfortunately despite that progress that's been made he still has a significant issue going on here. 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 For that reason I discussed with him today the possibility of perform a wound culture to ensure there's nothing overtly infected. 01/06/18 on evaluation today patient's wound is larger and deeper than previously evaluated. With that being said it did appear that his wound was infected after my last evaluation with him. Subsequently I did end up prescribing a prescription for Bactrim DS which she has been taking and having no complication with. Fortunately there does not appear to be any evidence of infection at this point in time as far as anything spreading, no want to touch, and overall I feel like things are showing signs of improvement. 01/13/18 on evaluation today patient appears to be even a little larger and deeper than last time. There still muscle exposed in the base of the wound. Nonetheless he does appear to be less erythematous I do believe inflammation is calming down also believe the infection looks like it's probably resolved at this time based on what I'm seeing. No fevers, chills, nausea, or vomiting noted at this time. 01/30/18 on evaluation today patient actually appears to visually look better for the most part. Unfortunately  those visually this looks better he does seem to potentially have what may be an abscess in the muscle that has been noted in the central portion of the wound. This is the first time that I have noted what appears to be fluctuance in the central portion of the muscle. With that being said I'm somewhat more concerned about the fact that this might indicate an abscess formation at this location. I do believe that an ultrasound would be appropriate. This is likely something we need to try to do as soon as possible. He has been switch to mupirocin ointment and he is no longer using the steroid ointment as prescribed by dermatology he sees them again next week he's been decreased from 60 to 40 mg of prednisone. 03/09/18 on evaluation today patient actually appears to be doing a little better compared to last time I saw him. There's not as much erythema surrounding the wound itself. He I did review his most recent infectious disease note which was dated 02/24/18. He saw Dr. Michel Bickers in [place]. 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 Patient is now off of antibiotics and they are continually observing this. There seems to be no change in the past two weeks in my pinion based on what the patient says and what I see today compared to what Dr. Megan Salon likely saw two weeks ago. No fevers, chills, nausea, or vomiting noted at this time. 03/23/18 on evaluation today patient's wound actually appears to be showing signs of improvement which is good news. He is currently still on the Dapsone. He is also working on tapering the prednisone to get off of this and Dr. Lottie Rater is working with him in this regard. Nonetheless overall I feel like the wound is doing well it does appear based on the infectious disease note that I reviewed from Dr. 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 I did also review the results of his ultrasound of left lower extremity which revealed there was a dentist tissue in the base of the wound without an abscess noted. 04/06/18 on evaluation today the patient's left lateral lower extremity ulcer actually appears to be doing fairly well which is excellent news. There does not appear to be any evidence of infection at this time which is also great news. Overall he still does have a significantly large ulceration although little by little he seems to be making progress. He is down to 10 mg a day of the prednisone. 04/20/18 on evaluation today patient actually appears to be doing excellent at this time in regard to his left lower extremity ulcer. He's making signs of good progress unfortunately this is taking much longer than we would really like to see but nonetheless he is making progress. Fortunately there does not appear to be any evidence of infection at this time. No fevers, chills, nausea, or vomiting noted at this time. The patient has not been using the Santyl due to the cost he hadn't got in this field yet. He's mainly been using the antibiotic ointment topically. Subsequently he also tells me that he really has not been scrubbing in the shower I think this would be helpful again as I told him it doesn't have to be anything too aggressive to even make it believe just enough to keep it free of some of the loose slough and biofilm on the wound surface. 05/11/18 on evaluation today patient's wound appears to be making slow but sure progress in regard to the left lateral lower extremity ulcer. He is been tolerating the dressing changes without complication. Fortunately there does not appear to be any evidence of infection at this time. He is still just using triple antibiotic ointment along with clobetasol occasionally over the area. He never got the Santyl and really does not  seem to intend to in my pinion. 06/01/18 on evaluation today patient appears to be doing a little better in regard to his left lateral lower extremity ulcer. He states that overall he does not feel like he is doing as well with the Dapsone as he did with the prednisone. Nonetheless he sees his dermatologist later today and is gonna talk to them about the possibility of going back on the prednisone. Overall again I believe that the wound would be better if you would utilize Santyl but he really does not seem to be interested in going back to the [place] at this point. He has been using triple antibiotic ointment. 06/15/18 on evaluation today patient's wound actually appears to be doing about the same at this point. Fortunately there is no signs of infection at this time. He has made slight improvements although he continues to not really want to clean the wound bed at this point. He states that he just doesn't mess with it he doesn't want to cause any problems with everything else he has going on. He has been on medication, antibiotics as prescribed by his dermatologist, for a staff infection of his lower extremities which is really drying out now and looking much better he tells me. Fortunately there is no sign of overall infection. 06/29/18 on evaluation today patient appears to be doing well in regard to his left lateral lower surety ulcer all things considering. Fortunately his staff infection seems to be greatly  improved compared to previous. He has no signs of infection and this is drying up quite nicely. He is still the doxycycline for this is no longer on cental, Dapsone, or any of the other medications. His dermatologist has recommended possibility of an infusion but right now he does not want to proceed with that. 07/13/18 on evaluation today patient appears to be doing about the same in regard to his left lateral lower surety ulcer. Fortunately there's no signs of infection at this time which is  great news. Unfortunately he still builds up a significant amount of Slough/biofilm of the surface of the wound he still is not really cleaning this as he should be appropriately. Again I'm able to easily with saline and gauze remove the majority of this on the surface which if you would do this at home would likely be a dramatic improvement for him as far as getting the area to improve. Nonetheless overall I still feel like he is making progress is just very slow. I think Santyl will be of benefit for him as well. Still he has not gotten this as of this point. 07/27/18 on evaluation today patient actually appears to be doing little worse in regards of the erythema around the periwound region of the wound he also tells me that he's been having more drainage currently compared to what he was experiencing last time I saw him. He states not quite as bad as what he had because this was infected previously but nonetheless is still appears to be doing poorly. Fortunately there is no evidence of systemic infection at this point. The patient tells me that he is not going to be able to afford the Santyl. He is still waiting to hear about the infusion therapy with his dermatologist. Apparently she wants an updated colonoscopy first. GIBBS, NAUGLE ([id number]) 08/10/18 on evaluation today patient appears to be doing better in regard to his left lateral lower extremity ulcer. Fortunately he is showing signs of improvement in this regard he's actually been approved for Remicade infusion's as well although this has not been scheduled as of yet. Fortunately there's no signs of active infection at this time in regard to the wound although he is having some issues with infection of the right lower extremity is been seen as dermatologist for this. Fortunately they are definitely still working with him trying to keep things under control. 09/07/18 on evaluation today patient is actually doing rather well in regard to his  left lateral lower extremity ulcer. He notes these actually having some hair grow back on his extremity which is something he has not seen in years. He also tells me that the pain is really not giving them any trouble at this time which is also good news overall she is very pleased with the progress he's using a combination of the mupirocin along with the probate is all mixed. 09/21/18 on evaluation today patient actually appears to be doing fairly well all things considered in regard to his looks from the ulcer. He's been tolerating the dressing changes without complication. Fortunately there's no signs of active infection at this time which is good news he is still on all antibiotics or prevention of the staff infection. He has been on prednisone for time although he states it is gonna contact his dermatologist and see if she put them on a short course due to some irritation that he has going on currently. Fortunately there's no evidence of any overall worsening this is going very  slow I think cental would be something that would be helpful for him although he states that $50 for tube is quite expensive. He therefore is not willing to get that at this point. 10/06/18 on evaluation today patient actually appears to be doing decently well in regard to his left lateral leg ulcer. He's been tolerating the dressing changes without complication. Fortunately there's no signs of active infection at this time. Overall I'm actually rather pleased with the progress he's making although it's slow he doesn't show any signs of infection and he does seem to be making some improvement. I do believe that he may need a switch up and dressings to try to help this to heal more appropriately and quickly. 10/19/18 on evaluation today patient actually appears to be doing better in regard to his left lateral lower extremity ulcer. This is shown signs of having much less Slough buildup at this point due to the fact he has been  using the [employer]. Obviously this is very good news. The overall size of the wound is not dramatically smaller but again the appearance is. 11/02/18 on evaluation today patient actually appears to be doing quite well in regard to his lower [place] ulcer. A lot of the skin around the ulcer is actually somewhat irritating at this point this seems to be more due to the dressing causing irritation from the adhesive that anything else. Fortunately there is no signs of active infection at this time. 11/24/18 on evaluation today patient appears to be doing a little worse in regard to his overall appearance of his lower extremity ulcer. There's more erythema and warmth around the wound unfortunately. He is currently on doxycycline which he has been on for some time. With that being said I'm not sure that seems to be helping with what appears to possibly be an acute cellulitis with regard to his left lower extremity ulcer. No fevers, chills, nausea, or vomiting noted at this time. 12/08/18 on evaluation today patient's wounds actually appears to be doing significantly better compared to his last evaluation. He has been using Santyl along with alternating tripling about appointment as well as the steroid cream seems to be doing quite well and the wound is showing signs of improvement which is excellent news. Fortunately there's no evidence of infection and in fact his culture came back negative with only normal skin flora noted. 12/21/2018 upon evaluation today patient actually appears to be doing excellent with regard to his ulcer. This is actually the best that I have seen it since have been helping to take care of him. It is both smaller as well as less slough noted on the surface of the wound and seems to be showing signs of good improvement with new skin growing from the edges. He has been using just the triamcinolone he does wonder if he can get a refill of that ointment today. 01/04/2019 upon evaluation today  patient actually appears to be doing well with regard to his left lateral lower extremity ulcer. With that being said it does not appear to be that he is doing quite as well as last time as far as progression is concerned. There does not appear to be any signs of infection or significant irritation which is good news. With that being said I do believe that he may benefit from switching to a collagen based dressing based on how clean The wound appears. 01/18/2019 on evaluation today patient actually appears to be doing well with regard to his wound  on the left lower extremity. He is not made a lot of progress compared to where we were previous but nonetheless does seem to be doing okay at this time which is good news. There is no signs of active infection which is also good news. My only concern currently is I do wish we can get him into utilizing the collagen dressing his insurance would not pay for the supplies that we ordered although it appears that he may be able to order this through his supply company that he typically utilizes. This is [employer]. Nonetheless he did try to order it during the office visit today and it appears this did go through. We will see if he can get that it is a different brand but nonetheless he has collagen and I do think will be beneficial. 02/01/2019 on evaluation today patient actually appears to be doing a little worse today in regard to the overall size of his wounds. Fortunately there is no signs of active infection at this time. That is visually. Nonetheless when this is happened before it was due to infection. For that reason were somewhat concerned about that this time as well. 02/08/2019 on evaluation today patient unfortunately appears to be doing slightly worse with regard to his wound upon evaluation today. Is measuring a little deeper and a little larger unfortunately. I am not really sure exactly what is causing this to enlarge he actually did see  his dermatologist she is going to see about initiating Humira for him. Subsequently she also did do steroid injections into the wound itself in the periphery. Nonetheless still nonetheless he seems to be getting a little bit larger he is gone back to just using the steroid cream topically which I think is appropriate. I would say hold off on the collagen for the time being is definitely a good thing to do. Based on the culture results which we finally did get the final result back regarding it shows staph as the bacteria noted again that can be a normal skin bacteria based on the fact however he is having increased drainage and worsening of the wound measurement wise I would go ahead and place him on an antibiotic today I do believe for this. 02/15/2019 on evaluation today patient actually appears to be doing somewhat better in regard to his ulcer. There is no signs of worsening at this time I did review his culture results which showed evidence of Staphylococcus aureus but not MRSA. Again this could just be more related to the normal skin bacteria although he states the drainage has slowed down quite a bit he may have had a mild infection not just colonization. And was much smaller and then since around10/04/2019 on evaluation today patient appears to be doing unfortunately worse as far as the size of the wound. I really feel like that this is steadily getting larger again it had been doing excellent right at the beginning of September we have seen a steady increase in the area of the wound it is almost 2-1/2 times the size it was on September 1. Obviously this is a bad trend this is not wanting to see. For that reason we went back to using just the topical triamcinolone cream which does seem to help with inflammation. I checked him for bacteria by way of culture and nothing showed positive there. I am considering giving him a short course of a tapering steroid VERONICA, GUERRANT ([id number]) today  to see if that is can be  beneficial for him. The patient is in agreement with giving that a try. 03/08/2019 on evaluation today patient appears to be doing very well in comparison to last evaluation with regard to his lower extremity ulcer. This is showing signs of less inflammation and actually measuring slightly smaller compared to last time every other week over the past month and a half he has been measuring larger larger larger. Nonetheless I do believe that the issue has been inflammation the prednisone does seem to have been beneficial for him which is good news. No fevers, chills, nausea, vomiting, or diarrhea. 03/22/2019 on evaluation today patient appears to be doing about the same with regard to his leg ulcer. He has been tolerating the dressing changes without complication. With that being said the wound seems to be mostly arrested at its current size but really is not making any progress except for when we prescribed the prednisone. He did show some signs of dropping as far as the overall size of the wound during that interval week. Nonetheless this is something he is not on long-term at this point and unfortunately I think he is getting need either this or else the Humira which his dermatologist has discussed try to get approval for. With that being said he will be seeing his dermatologist on the 11th of this month that is November. 04/19/2019 on evaluation today patient appears to be doing really about the same the wound is measuring slightly larger compared to last time I saw him. He has not been into the office since November 2 due to the fact that he unfortunately had Covid as that his entire family. He tells me that it was rough but they did pull-through and he seems to be doing much better. Fortunately there is no signs of active infection at this time. No fevers, chills, nausea, vomiting, or diarrhea. 05/10/2019 on evaluation today patient unfortunately appears to be doing  significantly worse as compared to last time I saw him. He does tell me that he has had his first dose of Humira and actually is scheduled to get the next one in the upcoming week. With that being said he tells me also that in the past several days he has been having a lot of issues with green drainage she showed me a picture this is more blue-green in color. He is also been having issues with increased sloughy buildup and the wound does appear to be larger today. Obviously this is not the direction that we want everything to take based on the starting of his Humira. Nonetheless I think this is definitely a result of likely infection and to be honest I think this is probably Pseudomonas causing the infection based on what I am seeing. 05/24/2019 on evaluation today patient unfortunately appears to be doing significantly worse compared to his prior evaluation with me 2 weeks ago. I did review his culture results which showed that he does have Staph aureus as well as Pseudomonas noted on the culture. Nonetheless the Levaquin that I prescribed for him does not appear to have been appropriate and in fact he tells me he is no longer experiencing the green drainage and discharge that he had at the last visit. Fortunately there is no signs of active infection at this time which is good news although the wound has significantly worsened it in fact is much deeper than it was previous. We have been utilizing up to this point triamcinolone ointment as the prescription topical of choice but at  this time I really feel like that the wound is getting need to be packed in order to appropriately manage this due to the deeper nature of the wound. Therefore something along the lines of an alginate dressing may be more appropriate. 05/31/2019 upon inspection today patient's wound actually showed signs of doing poorly at this point. Unfortunately he just does not seem to be making any good progress despite what we have tried.  He actually did go ahead and pick up the Cipro and start taking that as he was noticing more green drainage he had previously completed the Levaquin that I prescribed for him as well. Nonetheless he missed his appointment for the seventh last week on Wednesday with the wound care center and [facility] where his dermatologist referred him. Obviously I do think a second opinion would be helpful at this point especially in light of the fact that the patient seems to be doing so poorly despite the fact that we have tried everything that I really know how at this point. The only thing that ever seems to have helped him in the past is when he was on high doses of continual steroids that did seem to make a difference for him. 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 06/07/2019 upon evaluation today patient unfortunately appears to be doing worse yet again with regard to his wound. In fact I am starting to question whether or not he may have a fluid pocket in the muscle at this point based on the bulging and the soft appearance to the central portion of the muscle area. There is not anything draining from the muscle itself at this time which is good news but nonetheless the wound is expanding. I am not really seeing any results of the Humira as far as overall wound progression based on what I am seeing at this point. The patient has been referred for second opinion with regard to his wound to the [facility] wound care center by his dermatologist which I definitely am not in opposition to. Unfortunately we tried multiple dressings in the past including collagen, alginate, and at one point even Hydrofera Blue. With that being said he is never really used it for any significant amount of time due to the fact that he often complains of pain associated with  these dressings and then will go back to either using the Santyl which she has done intermittently or more frequently the triamcinolone. He is also using his own compression stockings. We have wrapped him in the past but again that was something else that he really was not a big fan of. 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 He seemed to do much better when he was on the steroids although honestly I understand that the steroids are not necessarily the best medication to be on long-term obviously 06/14/2019 on evaluation today patient appears to be doing actually a little bit better with regard to the overall  appearance with his leg. Unfortunately he does continue to have issues with what appears to be some fluid underneath the muscle although he did see the wound specialty center at [facility] last week their main goals were to see about infusion therapy in place of the Humira as they feel like that is not quite strong enough. They also recommended that we continue with the treatment otherwise as we are they felt like that was appropriate and they are okay with him continuing to follow-up here with [place] in that regard. With that being said they are also sending him to the vein specialist there to see about vein stripping and if that would be of benefit for him. Subsequently they also did not really address whether or not an ultrasound of the muscle area to see if there is anything that needs to be addressed here would be appropriate or not. For that reason I discussed this with him last week I think we may proceed down that road at this point. 06/21/2019 upon evaluation today patient's  wound actually appears to be doing slightly better compared to previous evaluations. I do believe that he has made a difference with regard to the progression here with the use of oral steroids. Again in the past has been the only thing that is really calm things down. He does tell me that from [facility] is gotten a good news from there that there are no further vein stripping that is necessary at this point. I do not have that available for review today although the patient did relay this to me. He also did obtain and have the ultrasound of the wound completed which I did sign off on today. It does appear that there is no fluid collection under the muscle this is likely then just edematous tissue in general. That is also good news. Overall I still believe the inflammation is the main issue here. He did inquire about the possibility of a wound VAC again with the muscle protruding like it is I am not really sure whether the wound VAC is necessarily ideal or not. That is something we will have to consider although I do believe he may need compression wrapping to try to help with edema control which could potentially be of benefit. 06/28/2019 on evaluation today patient appears to be doing slightly better measurement wise although this is not terribly smaller he least seems to be trending towards that direction. With that being said he still seems to have purulent drainage noted in the wound bed at this time. He has been on Levaquin followed by Cipro over the past month. Unfortunately he still seems to have some issues with active infection at this time. I did perform a culture last week in order to evaluate and see if indeed there was still anything going on. Subsequently the culture did come back Vanmeter, Thayer ([id number]) showing Pseudomonas which is consistent with the drainage has been having which is blue-green in color. He also has had an odor that again was somewhat consistent with Pseudomonas as well.  Long story short it appears that the culture showed an intermediate finding with regard to how well the Cipro will work for the Pseudomonas infection. Subsequently being that he does not seem to be clearing up and at best what we are doing is just keeping this at [place] I think he may need to see infectious disease to discuss IV antibiotic options. 07/05/2019 upon evaluation today patient appears to be doing okay in regard to  his leg ulcer. He has been tolerating the dressing changes at this point without complication. Fortunately there is no signs of active infection at this time which is good news. No fevers, chills, nausea, vomiting, or diarrhea. With that being said he does have an appointment with infectious disease tomorrow and his primary care on Wednesday. Again the reason for the infectious disease referral was due to the fact that he did not seem to be fully resolving with the use of oral antibiotics and therefore we were thinking that IV antibiotic therapy may be necessary secondary to the fact that there was an intermediate finding for how effective the Cipro may be. Nonetheless again he has been having a lot of purulent and even green drainage. Fortunately right now that seems to have calmed down over the past week with the reinitiation of the oral antibiotic. Nonetheless we will see what Dr. Megan Salon has to say. 07/12/2019 upon evaluation today patient appears to be doing about the same at this point in regard to his left lower extremity ulcer. Fortunately there is no signs of active infection at this time which is good news I do believe the Levaquin has been beneficial I did review Dr. Hale Bogus note and to be honest I agree that the patient's leg does appear to be doing better currently. What we found in the past as he does not seem to really completely resolve he will stop the antibiotic and then subsequently things will revert back to having issues with blue-green drainage,  increased pain, and overall worsening in general. Obviously that is the reason I sent him back to infectious disease. 07/19/2019 upon evaluation today patient appears to be doing roughly the same in size there is really no dramatic improvement. He has started back on the Levaquin at this point and though he seems to be doing okay he did still have a lot of blue/green drainage noted on evaluation today unfortunately. I think that this is still indicative more likely of a Pseudomonas infection as previously noted and again he does see Dr. Megan Salon in just a couple of days. I do not know that were really able to effectively clear this with just oral antibiotics alone based on what I am seeing currently. Nonetheless we are still continue to try to manage as best we can with regard to the patient and his wound. I do think the wrap was helpful in decreasing the edema which is excellent news. No fevers, chills, nausea, vomiting, or diarrhea. 07/26/2019 upon evaluation today patient appears to be doing slightly better with regard to the overall appearance of the muscle there is no dark discoloration centrally. Fortunately there is no signs of active infection at this time. No fevers, chills, nausea, vomiting, or diarrhea. Patient's wound bed currently the patient did have an appointment with Dr. Megan Salon at infectious disease last week. With that being said Dr. Megan Salon the patient states was still somewhat hesitant about put him on any IV antibiotics he wanted [place] to repeat cultures today and then see where things go going forward. He does look like Dr. Megan Salon because of some improvement the patient did have with the Levaquin wanted [place] to see about repeating cultures. If it indeed grows the Pseudomonas again then he recommended a possibility of considering a PICC line placement and IV antibiotic therapy. He plans to see the patient back in 1 to 2 weeks. 08/02/2019 upon evaluation today patient appears to be  doing poorly with regard to his left lower extremity.  We did get the results of his culture back it shows that he is still showing evidence of Pseudomonas which is consistent with the purulent/blue-green drainage that he has currently. Subsequently the culture also shows that he now is showing resistance to the oral fluoroquinolones which is unfortunate as that was really the only thing to treat the infection prior. I do believe that he is looking like this is going require IV antibiotic therapy to get this under control. Fortunately there is no signs of systemic infection at this time which is good news. The patient does see Dr. Megan Salon tomorrow. 08/09/2019 upon evaluation today patient appears to be doing better with regard to his left lower extremity ulcer in regard to the overall appearance. He is currently on IV antibiotic therapy. As ordered by Dr. Megan Salon. Currently the patient is on ceftazidime which she is going to take for the next 2 weeks and then follow-up for 4 to 5-week appointment with Dr. Megan Salon. The patient started this this past Friday symptoms have not for a total of 3 days currently in full. 08/16/2019 upon evaluation today patient's wound actually does show muscle in the base of the wound but in general does appear to be much better as far as the overall evidence of infection is concerned. In fact I feel like this is for the most part cleared up he still on the IV antibiotics he has not completed the full course yet but I think he is doing much better which is excellent news. 08/23/2019 upon evaluation today patient appears to be doing about the same with regard to his wound at this point. He tells me that he still has pain unfortunately. Fortunately there is no evidence of systemic infection at this time which is great news. There is significant muscle protrusion. 09/13/19 upon evaluation today patient appears to be doing about the same in regard to his leg unfortunately. He still  has a lot of drainage coming from the ulceration there is still muscle exposed. With that being said the patient's last wound culture still showed an intermediate finding with regard to the Pseudomonas he still having the bluish/green drainage as well. Overall I do not know that the wound has completely cleared of infection at this point. Fortunately there is no signs of active infection systemically at this point which is good news. 09/20/2019 upon evaluation today patient's wound actually appears to be doing about the same based on what I am seeing currently. I do not see any signs of systemic infection he still does have evidence of some local infection and drainage. He did see Dr. Megan Salon last week and Dr. Megan Salon states that he probably does need a different IV antibiotic although he does not want to put him on this until the patient begins the Remicade infusion which is actually scheduled for about 10 days out from today on 13 May. Following that time Dr. Megan Salon is good to see him back and then will evaluate the feasibility of starting him on the IV antibiotic therapy once again at that point. I do not disagree with this plan I do believe as Dr. Megan Salon stated in his note that I reviewed today that the patient's issue is multifactorial with the pyoderma being 1 aspect of this that were hoping the Remicade will be helpful for her. In the meantime I think the gentamicin is, helping to keep things under decent okay control in regard to the ulcer. 09/27/2019 upon evaluation today patient appears to be doing about  the same with regard to his wound still there is a lot of muscle exposure though he does have some hyper granulation tissue noted around the edge and actually some granulation tissue starting to form over the muscle which is actually good news. Fortunately there is no evidence of active infection which is also good news. His pain is less at this point. 5/21; this is a patient I have not  seen in a long time. 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 He has a large wound on the left lateral leg with protruding muscle. He comes in the clinic today showing the same area on his left medial ankle. He says there is been a spot there for some time although we have not previously defined this. Today he has a clearly defined area with slight amount of skin breakdown surrounded by raised areas with a purplish hue in color. This is not painful he says it is irritated. 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 He still has muscle protruding from the lateral portion of his left leg but fortunately the new area that they were concerned about at his last visit does not appear to have opened at this point. He is currently on Remicade infusions and seems to be doing better in my opinion in fact the wound itself seems to be overall much better. The purplish discoloration that he did have seems to have resolved and I think that is a good sign that hopefully the Remicade is doing its job. He does Benecke, [name] ([id number]) have some biofilm noted over the surface of the wound. 11/01/2019 on evaluation today patient's wound actually appears to be doing excellent at this time. Fortunately there is no evidence of active infection and overall I feel like he is making great progress. The Remicade seems to be due excellent job in my opinion. 11/08/19 evaluation today vision actually appears to be doing quite well with regard to his weight ulcer. He's been tolerating dressing changes without complication. Fortunately there is no evidence of infection. No fevers, chills, nausea, or vomiting noted at this time. Overall states that is having more itching than pain which is actually a good sign in my opinion. 12/13/2019 upon evaluation today patient appears to be doing  well today with regard to his wound. He has been tolerating the dressing changes without complication. Fortunately there is no sign of active infection at this time. No fevers, chills, nausea, vomiting, or diarrhea. Overall I feel like the infusion therapy has been very beneficial for him. 01/06/2020 on evaluation today patient appears to be doing well with regard to his wound. This is measuring smaller and actually looks to be doing better. Fortunately there is no signs of active infection at this point. No fevers, chills, nausea, vomiting, or diarrhea. With that being said he does still have the blue-green drainage but this does not seem to be causing any significant issues currently. He has been using the gentamicin that does seem to be keeping things under decent control at this point. He goes later this morning for his next infusion therapy for the pyoderma which seems to also be very beneficial. 02/07/2020 on evaluation today patient appears to be doing about the same in regard to his wounds currently. Fortunately there is no signs of active infection systemically he does still have evidence of local infection still using gentamicin. He also is showing  some signs of improvement albeit slowly I do feel like we are making some progress here. 02/21/2020 upon evaluation today patient appears to be making some signs of improvement the wound is measuring a little bit smaller which is great news and overall I am very pleased with where he stands currently. He is going to be having infusion therapy treatment on the 15th of this month. Fortunately there is no signs of active infection at this time. 03/13/2020 I do believe patient's wound is actually showing some signs of improvement here which is great news. He has continue with the infusion therapy through rheumatology/dermatology at [facility]. That does seem to be beneficial. I still think he gets as much benefit from this as he did from the prednisone  initially but nonetheless obviously this is less harsh on his body that the prednisone as far as they are concerned. 03/31/2020 on evaluation today patient's wound actually showing signs of some pretty good improvement in regard to the overall appearance of the wound bed. There is still muscle exposed though he does have some epithelial growth around the edges of the wound. Fortunately there is no signs of active infection at this time. No fevers, chills, nausea, vomiting, or diarrhea. 04/24/2020 upon evaluation today patient appears to be doing about the same in regard to his leg ulcer. He has been tolerating the dressing changes without complication. Fortunately there is no signs of active infection at this time. No fevers, chills, nausea, vomiting, or diarrhea. With that being said he still has a lot of irritation from the bandaging around the edges of the wound. We did discuss today the possibility of a referral to plastic surgery. 05/22/2020 on evaluation today patient appears to be doing well with regard to his wounds all things considered. He has not been able to get the Chantix apparently there is a recall nurse that I was unaware of put out by [employer] involuntarily. Nonetheless for now I am and I have to do some research into what may be the best option for him to help with quitting in regard to smoking and we discussed that today. 06/26/2020 upon evaluation today patient appears to be doing well with regard to his wound from the standpoint of infection I do not see any signs of infection at this point. With that being said unfortunately he is still continuing to have issues with muscle exposure and again he is not having a whole lot of new skin growth unfortunately. There does not appear to be any signs of active infection at this time. No fevers, chills, nausea, vomiting, or diarrhea. 07/10/2020 upon evaluation today patient appears to be doing a little bit more poorly currently compared to where  he was previous. I am concerned currently about an active infection that may be getting worse especially in light of the increased size and tenderness of the wound bed. No fevers, chills, nausea, vomiting, or diarrhea. 07/24/2020 upon evaluation today patient appears to be doing poorly in regard to his leg ulcer. He has been tolerating the dressing changes without complication but unfortunately is having a lot of discomfort. 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 Subsequently I think he is going require possibly IV antibiotics to get this under control. I am very concerned about the severity of his infection and the amount of discomfort he is having. 07/31/2020 upon evaluation today patient appears to be doing about the same in regard to his leg wound. He  did see Dr. Megan Salon and Dr. Megan Salon is actually going to start him on IV antibiotics. He goes for the PICC line tomorrow. With that being said there do not have that run for 2 weeks and then see how things are doing and depending on how he is progressing they may extend that a little longer. Nonetheless I am glad this is getting ready to be in place and definitely feel it may help the patient. In the meantime is been using mainly triamcinolone to the wound bed has an anti-inflammatory. 08/07/2020 on evaluation today patient appears to be doing well with regard to his wound compared even last week. In the interim he has gotten the PICC line placed and overall this seems to be doing excellent. There does not appear to be any evidence of infection which is great news systemically although locally of course has had the infection this appears to be improving with the use of the antibiotics. 08/14/2020 upon evaluation today patient's wound actually showing signs of excellent improvement. Overall the irritation has significantly improved the drainage is back down to more of a normal  level and his pain is really pretty much nonexistent compared to what it was. Obviously I think that this is significantly improved secondary to the IV antibiotic therapy which has made all the difference in the world. Again he had a resistant form of Pseudomonas for which oral antibiotics just was not cutting it. Nonetheless I do think that still we need to consider the possibility of a surgical closure for this wound is been open so long and to be honest with muscle exposed I think this can be very hard to get this to close outside of this although definitely were still working to try to do what we can in that regard. 08/21/2020 upon evaluation today patient appears to be doing very well with regard to his wounds on the left lateral lower extremity/calf area. Fortunately there does not appear to be signs of active infection which is great news and overall very pleased with where things stand today. He is actually wrapping up his treatment with IV antibiotics tomorrow. After that we will see where things go from there. 08/28/2020 upon evaluation today patient appears to be doing decently well with regard to his leg ulcer. There does not appear to be any signs of Arnall, Johntay ([id number]) active infection which is great news and overall very pleased with where things stand today. No fevers, chills, nausea, vomiting, or diarrhea. 09/18/2020 upon evaluation today patient appears to be doing well with regard to his infection which I feel like is better. Unfortunately he is not doing as well with regard to the overall size of the wound which is not nearly as good at this point. I feel like that he may be having an issue here with the pyoderma being somewhat out of control. I think that he may benefit from potentially going back and talking to the dermatologist about what to do from the pyoderma standpoint. I am not certain if the infusions are helping nearly as much is what the prednisone did in the  past. 10/02/2020 upon evaluation today patient appears to be doing well with regard to his leg ulcer. He did go to the Psychiatric nurse. Unfortunately they feel like there is a 10% chance that most that he would be able to heal and that the skin graft would take. Obviously this has led him to not be able to go down that path as far as  treatment is concerned. Nonetheless he does seem to be doing a little bit better with the prednisone that I gave him last time. I think that he may need to discuss with dermatology the possibility of long-term prednisone as that seems to be what is most helpful for him to be perfectly honest. I am not sure the Remicade is really doing the job. 10/17/2020 upon evaluation today patient appears to be doing a little better in regard to his wound. In fact the case has been since we did the prednisone on May 2 for him that we have noticed a little bit of improvement each time we have seen a size wise as well as appearance wise as well as pain wise. I think the prednisone has had a greater effect then the infusion therapy has to be perfectly honest. With that being said the patient also feels significantly better compared to what he was previous. All of this is good news but nonetheless I am still concerned about the fact that again we are really not set up to long-term manage him as far as prednisone is concerned. Obviously there are things that you need to be watched I completely understand the risk of prednisone usage as well. 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 With all that being said I do believe that we can give him another round of the prednisone which she is requesting today because of the improvement that he seen since we did that first round. 10/30/2020 upon evaluation today patient's wound actually is showing signs of doing quite well. There does not appear to be any evidence of infection which is great news and  overall very pleased with where things stand today. No fevers, chills, nausea, vomiting, or diarrhea. He tells me that the prednisone still has seem to have helped he wonders if we can extend that for just a little bit longer. He did not have the appointment with a dermatologist although he did have an infusion appointment last Friday. That was at [facility]. With that being said he tells me he could not do both that as well as the appointment with the physician on the same day therefore that is can have to be rescheduled. I really want to see if there is anything they feel like that could be done differently to try to help this out as I am not really certain that the infusions are helping significantly here. 11/13/2020 upon evaluation today patient unfortunately appears to be doing somewhat poorly in regard to his wound I feel like this is actually worsening from the standpoint of the pyoderma spreading. I still feel like that he may need something different as far as trying to manage this going forward. Again we did the prednisone unfortunately his blood sugars are not doing so well because of this. Nonetheless I believe that the patient likely needs to try topical steroid. We have done triamcinolone for a while I think going with something stronger such as clobetasol could be beneficial again this is not something I do lightly I discussed this with the patient that again this does not normally put underneath an occlusive dressing. Nonetheless I think a thin film as such could help with some of the stronger anti-inflammatory effects. We discussed this today. He would like to try to give this a trial for the next couple weeks. I definitely think that is something that we can do. Evaluate7/03/2021 and today patient's wound bed  actually showed signs of doing really about the same. There was a little expansion of the size of the wound and that leading edge that we done looking out although the clobetasol does  seem to have slowed this down a bit in my opinion. There is just 1 small area that still seems to be progressing based on what I see. Nonetheless I am concerned about the fact this does not seem to be improving if anything seems to be doing a little bit worse. I do not know that the infusions are really helping him much as next infusion is August 5 his appointment with dermatology is July 25. Either way I really think that we need to have a conversation potentially about this and I am actually going to see if I can talk with Dr. Lillia Carmel in order to see where things stand as well. 12/11/2020 upon evaluation today patient appears to be doing worse in regard to his leg ulcer. Unfortunately I just do not think this is making the progress that I would like to see at this point. Honestly he does have an appointment with dermatology and this is in 2 days. I am wondering what they may have to offer to help with this. Right now what I am seeing is that he is continuing to show signs of worsening little by little. Obviously that is not great at all. Is the exact opposite of what we are looking for. Objective Constitutional Obese and well-hydrated in no acute distress. Vitals Time Taken: 8:21 AM, Height: 71 in, Weight: 338 lbs, BMI: 47.1, Temperature: 98.2 F, Pulse: 84 bpm, Respiratory Rate: 18 breaths/min, Blood Pressure: 128/88 mmHg, Capillary Blood Glucose: 124 mg/dl. Respiratory normal breathing without difficulty. Psychiatric this patient is able to make decisions and demonstrates good insight into disease process. Alert and Oriented x 3. pleasant and cooperative. General Notes: Upon inspection patient's wound bed actually showed signs of infection which again I am concerned about at this point. I do not see any evidence of systemic infection though locally I feel like there is cellulitis surrounding the wound this has not been present I been keeping a close eye on this this seems to be worse and  different today compared even what it was last time I saw him. Unfortunately the wound is also Cordoba, Josie ([id number]) showing signs of worsening in general which is not good. Integumentary (Hair, Skin) Wound #1 status is Open. Original cause of wound was Gradually Appeared. The date acquired was: 11/18/2015. The wound has been in treatment 209 weeks. The wound is located on the Left,Lateral Lower Leg. The wound measures 6.5cm length x 8cm width x 1cm depth; 40.841cm^2 area and 40.841cm^3 volume. There is muscle and Fat Layer (Subcutaneous Tissue) exposed. There is no tunneling noted, however, there is undermining starting at 7:00 and ending at 10:00 with a maximum distance of 0.8cm. There is a large amount of purulent drainage noted. The wound margin is epibole. There is medium (34-66%) red, pink granulation within the wound bed. There is a medium (34-66%) amount of necrotic tissue within the wound bed including Adherent Slough. 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 Bathing/ Shower/ Hygiene: Clean wound with Normal Saline or wound cleanser. Edema Control - Lymphedema /  Segmental Compressive Device / Other: Patient to wear own compression stockings. Remove compression stockings every night before going to bed and put on every morning when getting up. Other: - Call [facility] for knee high compression socks 20/62m/Hg Medications-Please add to medication list.: Other: - Clobetasol as prescribed, script sent to pharmacy of choice-apply thin to 7 o'clock area as directed every other day-stop with any increased pain Laboratory ordered  were: Wound culture routine - Left lower leg WOUND #1: - Lower Leg Wound Laterality: Left, Lateral Cleanser: Soap and Water Every Other Day/30 Days Discharge Instructions: Gently cleanse wound with antibacterial soap, rinse and pat dry prior to dressing wounds Topical: Triamcinolone Acetonide Cream, 0.1%, 15 (g) tube Every Other Day/30 Days Discharge Instructions: Apply in office only to 7 o'clock area Secondary Dressing: Mepilex Border Flex, 6x6 (in/in) Every Other Day/30 Days Discharge Instructions: Apply to wound as directed. Do not cut. Secured With: Tubigrip Size E, 3.5x10 (in/yds) Every Other Day/30 Days Discharge Instructions: Apply double layer Tubigrip E 3-finger-widths below knee to base of toes to secure dressing and/or for swelling. 1. Would recommend currently that we going continue with the wound care measures as before and the patient is in agreement with the plan. This includes the use of the triamcinolone to the wound bed and a thin amount followed by a silver alginate dressing and then a border foam dressing. 2. I am also can recommend he continue with either Tubigrip or compression sock at this point I do not think we can compression wrap him even if I wanted to right now with the active infection. 3. With regard to the pyoderma I does feel like this is not really controlled very well that with his most recent hemoglobin A1c being 168.6not certain what can be done from a prednisone standpoint anyway. He is also continuing to smoke which is not good and is a concern as well all things considered and grouped together I think that we are not doing so well at all at this point. I really do not know the best way to turn this around for him however. I think it is good to take quite a few things including getting his diabetes under control, smoking cessation, and potentially even a change in the treatment regimen regarding the pyoderma itself. With that being said these are all  things that are somewhat out of my control to be perfectly honest it in a large extent have a lot to do with what the patient can do for himself. Outside of getting this under control I think that the patient is at least at a risk for potentially an amputation if things get significantly worse from hoping that does not occur. We will see patient back for reevaluation in 1 week here in the clinic. If anything worsens or changes patient will contact our office for additional recommendations. LCARRIE, SCHOONMAKER([id number] Electronic Signature(s) Signed: 12/11/2020 10:38:32 AM By: SWorthy KeelerPA-C Entered By: SWorthy Keeleron 12/11/2020 10:38:32 LVICTOR, LANGENBACH([id number] -------------------------------------------------------------------------------- SuperBill Details Patient Name: LHaynes KernsDate of Service: 12/11/2020 Medical Record Number: [id number] Account Number: [id number] of Birth/Sex: 924-Aug-1982(40y.o. M) Treating RN: SDolan AmenPrimary Care Provider: CAlma FriendlyOther Clinician: Referring Provider: CAlma FriendlyTreating Provider/Extender: SSkipper Clichein Treatment: 209 Diagnosis Coding ICD-10 Codes Code Description [id number]-pressure chronic ulcer of left calf with fat layer exposed L88 Pyoderma gangrenosum L97.321 Non-pressure chronic ulcer of left ankle limited to breakdown of skin I87.2 Venous  insufficiency (chronic) (peripheral) L03.116 Cellulitis of left lower limb F17.208 Nicotine dependence, unspecified, with other nicotine-induced disorders Facility Procedures CPT4 Code: 10932355 Description: 99213 - WOUND CARE VISIT-LEV 3 EST PT Modifier: Quantity: 1 Physician Procedures CPT4 Code: 7322025 Description: 42706 - WC PHYS LEVEL 4 - EST PT Modifier: Quantity: 1 CPT4 Code: Description: ICD-10 Diagnosis Description L97.222 Non-pressure chronic ulcer of left calf with fat layer exposed L88 Pyoderma gangrenosum L97.321 Non-pressure  chronic ulcer of left ankle limited to breakdown of skin I87.2 Venous insufficiency (chronic)  (peripheral) Modifier: Quantity: Electronic Signature(s) Signed: 12/11/2020 10:38:51 AM By: Worthy Keeler PA-C Entered By: Worthy Keeler on 12/11/2020 10:38:51

## 2020-12-11 NOTE — Progress Notes (Signed)
ARVAL, BRANDSTETTER ([id number]) Visit Report for 12/11/2020 Arrival Information Details Patient Name: JOZEF, EISENBEIS Date of Service: 12/11/2020 8:15 AM Medical Record Number: [id number] Patient Account Number: [id number] Date of Birth/Sex: 25-Oct-1980 (40 y.o. M) Treating RN: Dolan Amen Primary Care Channing Yeager: [name] Other Clinician: Referring Orvell Careaga: [name] Treating Perrin Gens/Extender: [name] Cliche in Treatment: 209 Visit Information History Since Last Visit Added or deleted any medications: Yes Patient Arrived: Ambulatory Pain Present Now: Yes Arrival Time: 08:20 Accompanied By: self Transfer Assistance: None Patient Requires Transmission-Based Precautions: No Patient Has Alerts: Yes Electronic Signature(s) Signed: 12/11/2020 4:47:50 PM By: Dolan Amen RN Entered By: Dolan Amen on 12/11/2020 08:20:21 [name] ([id number]) -------------------------------------------------------------------------------- Clinic Level of Care Assessment Details Patient Name: [name] Date of Service: 12/11/2020 8:15 AM Medical Record Number: [id number] Patient Account Number: [id number] Date of Birth/Sex: 03-01-1981 (40 y.o. M) Treating RN: Dolan Amen Primary Care Shaneca Orne: [name] Other Clinician: Referring Harwood Nall: [name] Treating Elvin Mccartin/Extender: [name] Cliche in Treatment: 209 Clinic Level of Care Assessment Items TOOL 4 Quantity Score X - Use when only an EandM is performed on FOLLOW-UP visit 1 0 ASSESSMENTS - Nursing Assessment / Reassessment X - Reassessment of Co-morbidities (includes updates in patient status) 1 10 X- 1 5 Reassessment of Adherence to Treatment Plan ASSESSMENTS - Wound and Skin Assessment / Reassessment X - Simple Wound Assessment / Reassessment - one wound 1 5 []  - 0 Complex Wound Assessment / Reassessment - multiple wounds []  - 0 Dermatologic / Skin Assessment (not related to wound area) ASSESSMENTS -  Focused Assessment []  - Circumferential Edema Measurements - multi extremities 0 []  - 0 Nutritional Assessment / Counseling / Intervention []  - 0 Lower Extremity Assessment (monofilament, tuning fork, pulses) []  - 0 Peripheral Arterial Disease Assessment (using hand held doppler) ASSESSMENTS - Ostomy and/or Continence Assessment and Care []  - Incontinence Assessment and Management 0 []  - 0 Ostomy Care Assessment and Management (repouching, etc.) PROCESS - Coordination of Care X - Simple Patient / Family Education for ongoing care 1 15 []  - 0 Complex (extensive) Patient / Family Education for ongoing care []  - 0 Staff obtains Programmer, systems, Records, Test Results / Process Orders []  - 0 Staff telephones HHA, Nursing Homes / Clarify orders / etc []  - 0 Routine Transfer to another Facility (non-emergent condition) []  - 0 Routine Hospital Admission (non-emergent condition) []  - 0 New Admissions / Biomedical engineer / Ordering NPWT, Apligraf, etc. []  - 0 Emergency Hospital Admission (emergent condition) X- 1 10 Simple Discharge Coordination []  - 0 Complex (extensive) Discharge Coordination PROCESS - Special Needs []  - Pediatric / Minor Patient Management 0 []  - 0 Isolation Patient Management []  - 0 Hearing / Language / Visual special needs []  - 0 Assessment of Community assistance (transportation, D/C planning, etc.) []  - 0 Additional assistance / Altered mentation []  - 0 Support Surface(s) Assessment (bed, cushion, seat, etc.) INTERVENTIONS - Wound Cleansing / Measurement Leiber, Jayce ([id number]) X- 1 5 Simple Wound Cleansing - one wound []  - 0 Complex Wound Cleansing - multiple wounds X- 1 5 Wound Imaging (photographs - any number of wounds) []  - 0 Wound Tracing (instead of photographs) X- 1 5 Simple Wound Measurement - one wound []  - 0 Complex Wound Measurement - multiple wounds INTERVENTIONS - Wound Dressings []  - Small Wound Dressing one or multiple wounds  0 X- 1 15 Medium Wound Dressing one or multiple wounds []  - 0 Large Wound Dressing one or multiple wounds X- 1 5  Application of Medications - topical []  - 0 Application of Medications - injection INTERVENTIONS - Miscellaneous []  - External ear exam 0 []  - 0 Specimen Collection (cultures, biopsies, blood, body fluids, etc.) []  - 0 Specimen(s) / Culture(s) sent or taken to Lab for analysis []  - 0 Patient Transfer (multiple staff / Civil Service fast streamer / Similar devices) []  - 0 Simple Staple / Suture removal (25 or less) []  - 0 Complex Staple / Suture removal (26 or more) []  - 0 Hypo / Hyperglycemic Management (close monitor of Blood Glucose) []  - 0 Ankle / Brachial Index (ABI) - do not check if billed separately X- 1 5 Vital Signs Has the patient been seen at the hospital within the last three years: Yes Total Score: 85 Level Of Care: New/Established - Level 3 Electronic Signature(s) Signed: 12/11/2020 4:47:50 PM By: Dolan Amen RN Entered By: Dolan Amen on 12/11/2020 08:54:47 [name] ([id number]) -------------------------------------------------------------------------------- Lower Extremity Assessment Details Patient Name: [name] Date of Service: 12/11/2020 8:15 AM Medical Record Number: [id number] Patient Account Number: [id number] Date of Birth/Sex: Sep 24, 1980 (40 y.o. M) Treating RN: Dolan Amen Primary Care Shilah Hefel: [name] Other Clinician: Referring Honour Schwieger: [name] Treating Satcha Storlie/Extender: Jeri Cos Weeks in Treatment: 209 Edema Assessment Assessed: [Left: Yes] [Right: No] [Left: Edema] [Right: :] Calf Left: Right: Point of Measurement: 33 cm From Medial Instep 48 cm Ankle Left: Right: Point of Measurement: 12 cm From Medial Instep 29.5 cm Electronic Signature(s) Signed: 12/11/2020 4:47:50 PM By: Dolan Amen RN Entered By: Dolan Amen on 12/11/2020 08:33:11 [name], Wyatte  ([id number]) -------------------------------------------------------------------------------- Multi Wound Chart Details Patient Name: [name] Date of Service: 12/11/2020 8:15 AM Medical Record Number: [id number] Patient Account Number: [id number] Date of Birth/Sex: 10-19-1980 (39 y.o. M) Treating RN: Dolan Amen Primary Care Donyale Falcon: [name] Other Clinician: Referring Brittan Mapel: [name] Treating Dilraj Killgore/Extender: [name] Cliche in Treatment: 209 Vital Signs Height(in): 71 Capillary Blood Glucose 124 (mg/dl): Weight(lbs): 338 Pulse(bpm): 84 Body Mass Index(BMI): 47 Blood Pressure(mmHg): 128/88 Temperature(F): 98.2 Respiratory Rate(breaths/min): 18 Photos: [N/A:N/A] Wound Location: Left, Lateral Lower Leg N/A N/A Wounding Event: Gradually Appeared N/A N/A Primary Etiology: Pyoderma N/A N/A Comorbid History: Sleep Apnea, Hypertension, Colitis N/A N/A Date Acquired: 11/18/2015 N/A N/A Weeks of Treatment: 209 N/A N/A Wound Status: Open N/A N/A Measurements L x W x D (cm) 6.5x8x1 N/A N/A Area (cm) : 40.841 N/A N/A Volume (cm) : 40.841 N/A N/A % Reduction in Area: -732.00% N/A N/A % Reduction in Volume: -940.00% N/A N/A Starting Position 1 (o'clock): 7 Ending Position 1 (o'clock): 10 Maximum Distance 1 (cm): 0.8 Undermining: Yes N/A N/A Classification: Full Thickness With Exposed N/A N/A Support Structures Exudate Amount: Large N/A N/A Exudate Type: Purulent N/A N/A Exudate Color: yellow, brown, green N/A N/A Wound Margin: Epibole N/A N/A Granulation Amount: Medium (34-66%) N/A N/A Granulation Quality: Red, Pink N/A N/A Necrotic Amount: Medium (34-66%) N/A N/A Exposed Structures: Fat Layer (Subcutaneous Tissue): N/A N/A Yes Muscle: Yes Fascia: No Tendon: No Joint: No Bone: No Epithelialization: Small (1-33%) N/A N/A Treatment Notes Electronic Signature(s) Signed: 12/11/2020 4:47:50 PM By: Dolan Amen RN Entered By: Dolan Amen on  12/11/2020 08:51:15 Burlison, Oreste ([id number]) ZAYDON, KINSER ([id number]) -------------------------------------------------------------------------------- Multi-Disciplinary Care Plan Details Patient Name: [name] Date of Service: 12/11/2020 8:15 AM Medical Record Number: [id number] Patient Account Number: [id number] Date of Birth/Sex: [birth year] (40 y.o. M) Treating RN: Dolan Amen Primary Care Riko Lumsden: [name] Other Clinician: Referring Latorya Bautch: [name] Treating Iyonna Rish/Extender: Jeri Cos  Weeks in Treatment: [place] reviewed with physician Active Inactive Electronic Signature(s) Signed: 12/11/2020 4:47:50 PM By: Dolan Amen RN Entered By: Dolan Amen on 12/11/2020 08:33:26 HILERY, WINTLE ([id number]) -------------------------------------------------------------------------------- Pain Assessment Details Patient Name: [name] Date of Service: 12/11/2020 8:15 AM Medical Record Number: [id number] Patient Account Number: [id number] Date of Birth/Sex: 1981-01-10 (39 y.o. M) Treating RN: Dolan Amen Primary Care Chayson Charters: [name] Other Clinician: Referring Bayler Nehring: [name] Treating Terena Bohan/Extender: [name] Cliche in Treatment: 209 Active Problems Location of Pain Severity and Description of Pain Patient Has Paino Yes Site Locations Pain Location: Pain in Ulcers Rate the pain. Current Pain Level: 2 Pain Management and Medication Current Pain Management: Electronic Signature(s) Signed: 12/11/2020 4:47:50 PM By: Dolan Amen RN Entered By: Dolan Amen on 12/11/2020 08:21:34 [name] ([id number]) -------------------------------------------------------------------------------- Patient/Caregiver Education Details Patient Name: [name] Date of Service: 12/11/2020 8:15 AM Medical Record Number: [id number] Patient Account Number: [id number] Date of Birth/Gender: 15-Mar-1981 (39  y.o. M) Treating RN: Dolan Amen Primary Care Physician: [name] Other Clinician: Referring Physician: [name] Treating Physician/Extender: [name] Cliche in Treatment: 209 Education Assessment Education Provided To: Patient Education Topics Provided Wound/Skin Impairment: Methods: Explain/Verbal Responses: State content correctly Electronic Signature(s) Signed: 12/11/2020 4:47:50 PM By: Dolan Amen RN Entered By: Dolan Amen on 12/11/2020 08:55:03 [name] ([id number]) -------------------------------------------------------------------------------- Wound Assessment Details Patient Name: [name] Date of Service: 12/11/2020 8:15 AM Medical Record Number: [id number] Patient Account Number: [id number] Date of Birth/Sex: Jan 21, 1981 (39 y.o. M) Treating RN: Dolan Amen Primary Care Kaytlyn Din: [name] Other Clinician: Referring Javeon Macmurray: [name] Treating Rondell Frick/Extender: [name] Cliche in Treatment: 209 Wound Status Wound Number: 1 Primary Etiology: Pyoderma Wound Location: Left, Lateral Lower Leg Wound Status: Open Wounding Event: Gradually Appeared Comorbid History: Sleep Apnea, Hypertension, Colitis Date Acquired: 11/18/2015 Weeks Of Treatment: 209 Clustered Wound: No Photos Wound Measurements Length: (cm) 6.5 Width: (cm) 8 Depth: (cm) 1 Area: (cm) 40.841 Volume: (cm) 40.841 % Reduction in Area: -732% % Reduction in Volume: -940% Epithelialization: Small (1-33%) Tunneling: No Undermining: Yes Starting Position (o'clock): 7 Ending Position (o'clock): 10 Maximum Distance: (cm) 0.8 Wound Description Classification: Full Thickness With Exposed Support Structures Wound Margin: Epibole Exudate Amount: Large Exudate Type: Purulent Exudate Color: yellow, brown, green Foul Odor After Cleansing: No Slough/Fibrino Yes Wound Bed Granulation Amount: Medium (34-66%) Exposed Structure Granulation Quality: Red,  Pink Fascia Exposed: No Necrotic Amount: Medium (34-66%) Fat Layer (Subcutaneous Tissue) Exposed: Yes Necrotic Quality: Adherent Slough Tendon Exposed: No Muscle Exposed: Yes Necrosis of Muscle: No Joint Exposed: No Bone Exposed: No Electronic Signature(s) Signed: 12/11/2020 4:47:50 PM By: Dolan Amen RN Entered By: Dolan Amen on 12/11/2020 08:32:18 RAESHAUN, SIMSON ([id number]) ZUBAIR, LOFTON ([id number]) -------------------------------------------------------------------------------- Vitals Details Patient Name: [name] Date of Service: 12/11/2020 8:15 AM Medical Record Number: [id number] Patient Account Number: [id number] Date of Birth/Sex: 07-03-1980 (41 y.o. M) Treating RN: Dolan Amen Primary Care Auria Mckinlay: [name] Other Clinician: Referring Anthonny Schiller: [name] Treating Delphin Funes/Extender: [name] Cliche in Treatment: 209 Vital Signs Time Taken: 08:21 Temperature (F): 98.2 Height (in): 71 Pulse (bpm): 84 Weight (lbs): 338 Respiratory Rate (breaths/min): 18 Body Mass Index (BMI): 47.1 Blood Pressure (mmHg): 128/88 Capillary Blood Glucose (mg/dl): 124 Reference Range: 80 - 120 mg / dl Electronic Signature(s) Signed: 12/11/2020 4:47:50 PM By: Dolan Amen RN Entered By: Dolan Amen on 12/11/2020 02:77:41

## 2020-12-13 ENCOUNTER — Ambulatory Visit
Admit: 2020-12-13 | Discharge: 2020-12-14 | Payer: PRIVATE HEALTH INSURANCE | Attending: Dermatology | Primary: Dermatology

## 2020-12-13 DIAGNOSIS — L88 Pyoderma gangrenosum: Principal | ICD-10-CM

## 2020-12-13 DIAGNOSIS — Z79899 Other long term (current) drug therapy: Principal | ICD-10-CM

## 2020-12-13 MED ORDER — MYCOPHENOLATE MOFETIL 250 MG CAPSULE
ORAL_CAPSULE | Freq: Two times a day (BID) | ORAL | 0 refills | 30.00000 days | Status: CN
Start: 2020-12-13 — End: 2021-12-13

## 2020-12-13 MED ORDER — METHOTREXATE SODIUM 2.5 MG TABLET
ORAL_TABLET | ORAL | 0 refills | 140.00000 days | Status: CN
Start: 2020-12-13 — End: 2021-01-12

## 2020-12-13 NOTE — Unmapped (Signed)
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 FLARING w/ progression.  Chronic illness with exacerbation, active but slowly improving  - increase to infliximab 10 mg/kg every 6 weeks.  Updated orders in media - will have clinical staff fax it over.  -Continue follow-up with wound care every 2 weeks  -Continue topical gentamicin and alone daily to wound with bandage changes  -consider methotrexate, cyclosporine, abx (clinda/rifampin), ivig, ertapenem  -After the patient was informed of risks, benefits and side effects of intralesional steroid injection, the patient elected to undergo injection. Informed verbal consent was obtained. Risk of atrophy  and dyspigmentation with injection was explained. Kenalog 40 mg/ml was injected locally into the sites located L leg in a clean fashion following alcohol prep.   Total volume in ml=1.  Number of sites treated: 6   Wound care was explained to the patient    High risk medication use: infliximab  - quant gold unremarkable 2/22  - reviewed CBC and bun/cr, ast/alt 06/2020 - stable but with slight elevation of AST and ALT, recheck today  - hepatitis panel (HBV cAb / sAb and HCV) negative 07/2019 -- HBV non-immune  -repeat cbc, lfts, mg, k, lipid panel    The patient was advised to call for an appointment should any new, changing, or symptomatic lesions develop.     RTC: 3 months or sooner as needed   _________________________________________________________________      Chief Complaint     Chief Complaint   Patient presents with   ??? Wound Check     Pt states area on left lower leg has worsened.        HPI     Andrew Cameron is a 40 y.o. male who presents as a returning patient (last seen 07/04/2020) to [name] for follow up of pyoderma gangrenosum.     Notes he is having flaring after a recent pseudomonas infection. Recently on prednisone for 2 weeks. Other areas are well-controlled but this spot is worsening. Was seen by plastics who do not want to pursue surgery.      The patient denies any other new or changing lesions or areas of concern.     Pertinent Past Medical History       Problem List    None       Patient Active Problem List    Diagnosis Date Noted   ??? Normocytic anemia 07/07/2019   ??? High risk medication use 04/01/2019   ??? History of ulcerative colitis 06/11/2018   ??? Ulcerative colitis (CMS-HCC) 03/05/2018   ??? Venous stasis 02/24/2018   ??? Type 2 diabetes mellitus (CMS-HCC) 07/15/2017   ??? Lower extremity edema 02/20/2017   ??? Seasonal allergic rhinitis 11/25/2016   ??? Skin ulcer of left calf with fat layer exposed (CMS-HCC) 11/25/2016   ??? 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 Depression 04/17/2015   ??? Hyperlipidemia 04/17/2015       Past Medical History, Family History, Social History, Medication List, Allergies, and Problem List were reviewed in the rooming section of Epic.     ROS: Other than symptoms mentioned in the HPI, no fevers, chills, or other skin complaints    Physical Examination  GENERAL: Well-appearing male in no acute distress, resting comfortably.  NEURO: Alert and oriented, answers questions appropriately  SKIN: Examination of the left leg and groin was performed  - large ulcer with exposure fat and muscle on the left leg w/ surrounding erythema          All areas not commented on are within normal limits or unremarkable      (Approved Template 01/31/2020)

## 2020-12-16 LAB — AEROBIC CULTURE W GRAM STAIN (SUPERFICIAL SPECIMEN)

## 2020-12-18 ENCOUNTER — Encounter: Payer: 59 | Attending: Physician Assistant | Admitting: Physician Assistant

## 2020-12-18 ENCOUNTER — Other Ambulatory Visit: Payer: Self-pay

## 2020-12-18 DIAGNOSIS — L97222 Non-pressure chronic ulcer of left calf with fat layer exposed: Secondary | ICD-10-CM | POA: Insufficient documentation

## 2020-12-18 DIAGNOSIS — L97321 Non-pressure chronic ulcer of left ankle limited to breakdown of skin: Secondary | ICD-10-CM | POA: Insufficient documentation

## 2020-12-18 NOTE — Unmapped (Signed)
Faxed form to [employer] for increased dose of remicade to 10 mg/kg every 6 weeks. Fax # - [phone].

## 2020-12-18 NOTE — Unmapped (Signed)
Please fax updated form to [employer] for increased dose of remicade to 10 mg/kg every 6 weeks.           Thanks!

## 2020-12-18 NOTE — Progress Notes (Signed)
[name] ([id number]) Visit Report for 12/18/2020 Chief Complaint Document Details Patient Name: [name], [name] Date of Service: 12/18/2020 8:15 AM Medical Record Number: [id number] Patient Account Number: [id number] Date of Birth/Sex: [birth year] (40 y.o. 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 M) Treating RN: Dolan Amen Primary Care Provider: [name] Other Clinician: Referring Provider: [name] Treating Provider/Extender: [name] Cliche in Treatment: 210 History of Present Illness HPI Description: 12/04/16; 40 year old man who comes into the clinic today for review of a wound on the posterior left calf. He tells me that is been there for about a year. He is not a diabetic he does smoke half a pack per day. He was seen in the ER on 11/20/16 felt to have cellulitis around the wound and was given clindamycin. An x-ray did not show osteomyelitis. The patient initially tells me that he has a milk allergy that sets off a pruritic itching rash on his lower legs which she scratches incessantly and he thinks that's what may have set up the wound. He has been using various topical antibiotics and ointments without any effect. He works in a [employer] and is on his feet all day. He does not have a prior history  of wounds however he does have the rash on both lower legs the right arm and the ventral aspect of his left arm. These are excoriations and clearly have had scratching however there are of macular looking areas on both legs including a substantial larger area on the right leg. This does not have an underlying open area. There is no blistering. The patient tells me that 2 years ago in [place] in response to the rash on his legs he saw a dermatologist who told him he had a condition which may be pyoderma gangrenosum although I may be putting words into his mouth. He seemed to recognize this. 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 He is not in any treatment for this. He's had no recent travel 12/11/16; the patient arrives today with his wound and roughly the same condition we've been using silver alginate this is a deep punched out wound with some surrounding erythema but no tenderness. Biopsy I did did not show confirmed pyoderma gangrenosum suggested nonspecific inflammation and vasculitis but does not provide an actual description of what was seen by the pathologist. I'm really not able to understand this We have also received information from the patient's dermatologist in [place] notes from April 2016. This was a doctor Agarwal-antal. 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 He was prescribed topical steroid high potency under occlusion which helped but at this point the patient did not have a deep punched out wound. 12/18/16; the patient's wound is larger in terms of surface area however this surface looks better and there is less depth. The surrounding erythema also is better. The patient states that the wrap we put on came off 2 days ago when he has been using his compression stockings.  He we are in the process of getting a dermatology consult. 12/26/16 on evaluation today patient's left lower extremity wound shows evidence of infection with  surrounding erythema noted. He has been tolerating the dressing changes but states that he has noted more discomfort. There is a larger area of erythema surrounding the wound. No fevers, chills, nausea, or vomiting noted at this time. With that being said the wound still does have slough covering the surface. He is not allergic to any medication that he is aware of at this point. In regard to his right lower extremity he had several regions that are erythematous and pruritic he wonders if there's anything we can do to help that. 01/02/17 I reviewed patient's wound culture which was obtained his visit last week. He was placed on doxycycline at that point. Unfortunately that does not appear to be an antibiotic that would likely help with the situation however the pseudomonas noted on culture is sensitive to Cipro. Also unfortunately patient's wound seems to have a large compared to last week's evaluation. Not severely so but there are definitely increased measurements in general. He is continuing to have discomfort as well he writes this to be a seven out of 10. In fact he would prefer me not to perform any debridement today due to the fact that he is having discomfort and considering he has an active infection on the little reluctant to do so anyway. No fevers, chills, nausea, or vomiting noted at this time. 01/08/17; patient seems dermatology on September 5. I suspect dermatology will want the slides from the biopsy I did sent to their pathologist. I'm not sure if there is a way we can expedite that. In any case the culture I did before I left on vacation 3 weeks ago showed Pseudomonas he was given 10 days of Cipro and per her description of her intake nurses is actually somewhat better this week although the wound is quite a bit bigger than I remember the last time I saw this. He still has 3 more days of Cipro 01/21/17; dermatology appointment tomorrow. He has completed the ciprofloxacin for Pseudomonas.  Surface of the wound looks better however he is had some deterioration in the lesions on his right leg. Meantime the left lateral leg wound we will continue with sample 01/29/17; patient had his dermatology appointment but I can't yet see that note. He is completed his antibiotics. The wound is more superficial but considerably larger in circumferential area than when he came in. This is in his left lateral calf. He also has swollen erythematous areas with superficial wounds on the right leg and small papular areas on both arms. There apparently areas in her his upper thighs and buttocks I did not look at those. Dermatology biopsied the right leg. Hopefully will have their input next week. 02/05/17; patient went back to see his dermatologist who told him that he had a "scratching problem" as well as staph. He is now on a 30 day course of doxycycline and I believe she gave him triamcinolone cream to the right leg areas to help with the itching [not exactly sure but probably triamcinolone]. She apparently looked at the left lateral leg wound although this was not rebiopsied and I think felt to be ultimately part of the same pathogenesis. He is using sample border foam and changing nevus himself. He now has a new open area on the right posterior leg which was his biopsy site I don't have any of the dermatology notes  02/12/17; we put the patient in compression last week with SANTYL to the wound on the left leg and the biopsy. Edema is much better and the depth of the wound is now at level of skin. Area is still the same oBiopsy site on the right lateral leg we've also been using santyl with a border foam dressing and he is changing this himself. 02/19/17; Using silver alginate started last week to both the substantial left leg wound and the biopsy site on the right wound. He is tolerating compression well. Has a an appointment with his primary M.D. tomorrow wondering about diuretics although I'm wondering if  the edema problem is actually lymphedema 02/26/17; the patient has been to see his primary doctor Dr. Jerrel Ivory at [facility] our primary care. She started him on Lasix 20 mg and this seems to have helped with the edema. However we are not making substantial change with the left lateral calf wound and inflammation. The biopsy site on the right leg also looks stable but not really all that different. 03/12/17; the patient has been to see vein and vascular Dr. Lucky Cowboy. He has had venous reflux studies I have not reviewed these. I did get a call from his dermatology office. They felt that he might have pathergy based on their biopsy on his right leg which led them to look at the slides of MARIEL, GAUDIN ([id number]) the biopsy I did on the left leg and they wonder whether this represents pyoderma gangrenosum which was the original supposition in a man with ulcerative colitis albeit inactive for many years. They therefore recommended clobetasol and tetracycline i.e. 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 03/26/17; apparently the patient just had reflux studies not an appointment with Dr. dew. She arrives in clinic today having applied clobetasol for 2-3 weeks. He notes over the last 2-3 days excessive drainage having to change the dressing 3-4 times a day and also expanding erythema. 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 Lillia Carmel at [facility] dermatology locally who kindly saw him at my request. 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 My concern was the degree of erythema medially extending from his left leg wound which was either inflammation from pyoderma or cellulitis. I  put him on Augmentin however culture of the wound showed Pseudomonas which is quinolone sensitive. I really don't believe he has cellulitis however in view of everything I will continue and give him a course of Cipro. He is also on doxycycline as an immune modulator for the pyoderma. In addition to his original wound on the left lateral leg with surrounding erythema he has a wound on the right posterior calf which was an original biopsy site done by dermatology. 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 Saw Dr. Lillia Carmel yesterday. He has been using topical antibiotics to both wound areas his original wound on the left and the biopsies/pathergy area on the right. There is definitely some improvement in the inflammation around the wound on the right although the patient states he has increasing sensitivity of the wounds. He is on prednisone 60 and doxycycline 1 as prescribed by Dr. Lillia Carmel. He is covering the topical antibiotic with gauze  and putting this in his own compression stocks and changing this daily. He states that Dr. Lottie Rater did a culture of the left leg wound yesterday 05/07/17; pyoderma gangrenosum. The patient saw Dr. Lillia Carmel yesterday and has a follow-up with her in one month. He is still using topical antibiotics to both wounds although he can't recall exactly what type. He is still on prednisone 60 mg. Dr. Lillia Carmel stated that the doxycycline could stop if we were in agreement. 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 The right medial leg was induced by biopsy/pathergy. The area on the right is essentially healed. 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 The right medial leg has closed and remains closed. He is still on  prednisone 60. oHe tells me he missed his last dermatology appointment with Dr. Lillia Carmel but will make another appointment. He reports that her blood sugar at a recent screen in [place] was high 200's. He was 180 today. 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 I think he is going to require still much longer prednisone perhaps another 3 months before attempting to taper. In the meantime his wound is a lot better. Smaller. He is cleaning this off daily and applying topical antibiotics. When he was last in the clinic I thought about changing to [facility] and actually put in a couple of calls to dermatology although probably not during their business hours. 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 He continues on oral prednisone. He has been using triple antibiotic ointment. There is surface debris and we will transition to [facility] and have him return in 2 weeks. He has lost 30 pounds since his last appointment with lifestyle modification. He may benefit from topical steroid cream for treatment this can be considered at a later date. 08/22/17 on evaluation today patient appears to actually be doing rather well in regard to his left lateral lower extremity ulcer. He has actually been managed by Dr. Dellia Nims most recently. Patient is currently on oral steroids at this time. This seems to have been of benefit for him. Nonetheless his last visit was actually with Leah on 08/06/17. Currently he is not utilizing any topical steroid creams although this could be of benefit as well. No fevers, chills, nausea, or vomiting noted at this time. 09/05/17 on evaluation today patient appears to be doing better in regard to his left lateral lower extremity ulcer. He has been tolerating the dressing changes without complication. He is using Santyl with good effect. Overall I'm very  pleased with how things are standing at this point. Patient likewise is happy that this is doing better. 09/19/17 on evaluation today patient actually appears to be doing rather well in regard to his left lateral lower extremity ulcer. 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 He's not having any significant pain. 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 He has been tolerating the Santyl without complication and in general I feel like he's making good progress. 10/17/17 on evaluation today patient  appears to be doing very well in regard to his left lateral lower surety ulcer. He has been tolerating the dressing changes without complication. There does not appear to be any evidence of infection he's alternating the Santyl and the triple antibiotic ointment every other day this seems to be doing well for him. 11/03/17 on evaluation today patient appears to be doing very well in regard to his left lateral lower extremity ulcer. He is been tolerating the dressing changes without complication which is good news. Fortunately there does not appear to be any evidence of infection which is also great news. Overall is doing excellent they are starting to taper down on the prednisone is down to 40 mg at this point it also started topical clobetasol for him. 11/17/17 on evaluation today patient appears to be doing well in regard to his left lateral lower surety ulcer. He's been tolerating the dressing changes without complication. He does note that he is having no pain, no excessive drainage or discharge, and overall he feels like things are going about how he would expect and hope they would. Overall he seems to have no evidence of infection at this time in my opinion which is good news. 12/04/17-He is seen in follow-up evaluation  for right lateral lower extremity ulcer. He has been applying topical steroid cream. Today's measurement show slight increase in size. Over the next 2 weeks we will transition to every other day Santyl and steroid cream. He has been encouraged to monitor for changes and notify clinic with any concerns 12/15/17 on evaluation today patient's left lateral motion the ulcer and fortunately is doing worse again at this point. This just since last week to this week has close to doubled in size according to the patient. I did not seeing last week's I do not have a visual to compare this to in our system was also down so we do not have all the charts and at this point. Nonetheless it does have me somewhat concerned in regard to the fact that again he was worried enough about it he has contact the dermatology that placed them back on the full strength, 50 mg a day of the prednisone that he was taken previous. He continues to alternate using clobetasol along with Santyl at this point. He is obviously somewhat frustrated. 12/22/17 on evaluation today patient appears to be doing a little worse compared to last evaluation. Unfortunately the wound is a little deeper and slightly larger than the last week's evaluation. With that being said he has made some progress in regard to the irritation surrounding at this time unfortunately despite that progress that's been made he still has a significant issue going on here. 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 [name], [name] ([id number]) For that reason I discussed with him today the possibility of perform a wound culture to ensure there's nothing overtly infected. 01/06/18 on evaluation today patient's wound is larger and deeper than previously evaluated. With that being said it did appear that his wound was infected after  my last evaluation with him. Subsequently I did end up prescribing a prescription for Bactrim DS which she has been taking and having no complication with. Fortunately there does not appear to be any evidence of infection at this point in time as far as anything spreading, no want to touch, and overall I feel like  things are showing signs of improvement. 01/13/18 on evaluation today patient appears to be even a little larger and deeper than last time. There still muscle exposed in the base of the wound. Nonetheless he does appear to be less erythematous I do believe inflammation is calming down also believe the infection looks like it's probably resolved at this time based on what I'm seeing. No fevers, chills, nausea, or vomiting noted at this time. 01/30/18 on evaluation today patient actually appears to visually look better for the most part. Unfortunately those visually this looks better he does seem to potentially have what may be an abscess in the muscle that has been noted in the central portion of the wound. This is the first time that I have noted what appears to be fluctuance in the central portion of the muscle. With that being said I'm somewhat more concerned about the fact that this might indicate an abscess formation at this location. I do believe that an ultrasound would be appropriate. This is likely something we need to try to do as soon as possible. He has been switch to mupirocin ointment and he is no longer using the steroid ointment as prescribed by dermatology he sees them again next week he's been decreased from 60 to 40 mg of prednisone. 03/09/18 on evaluation today patient actually appears to be doing a little better compared to last time I saw him. There's not as much erythema surrounding the wound itself. He I did review his most recent infectious disease note which was dated 02/24/18. He saw Dr. Michel Bickers in [place]. 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 Patient is now off of antibiotics and they are continually observing this. There seems to be no change in the past two weeks in my pinion based on what the patient says and what I see today compared to what Dr. Megan Salon likely saw two weeks ago. No fevers, chills, nausea, or vomiting noted at this time. 03/23/18 on evaluation today patient's wound actually appears to be showing signs of improvement which is good news. He is currently still on the Dapsone. He is also working on tapering the prednisone to get off of this and Dr. Lottie Rater is working with him in this regard. Nonetheless overall I feel like the wound is doing well it does appear based on the infectious disease note that I reviewed from Dr. 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 I did also review the results of his ultrasound of left lower extremity which revealed there was a dentist tissue in the base of the wound without an abscess noted. 04/06/18 on evaluation today the patient's left lateral lower extremity ulcer actually appears to be doing fairly well which is excellent news. There does not appear to be any evidence of infection at this time which is also great news. Overall he still does have a significantly large ulceration although little by little he seems to be making progress. He is down to 10 mg a day of the prednisone. 04/20/18 on evaluation today patient actually appears to be doing excellent at this time in regard to his left lower extremity ulcer. He's making signs of good progress unfortunately this is taking much longer than we would really like to see but nonetheless he is  making progress. Fortunately there does not appear to be any evidence of infection at this time. No fevers, chills, nausea, or vomiting noted at this time.  The patient has not been using the Santyl due to the cost he hadn't got in this field yet. He's mainly been using the antibiotic ointment topically. Subsequently he also tells me that he really has not been scrubbing in the shower I think this would be helpful again as I told him it doesn't have to be anything too aggressive to even make it believe just enough to keep it free of some of the loose slough and biofilm on the wound surface. 05/11/18 on evaluation today patient's wound appears to be making slow but sure progress in regard to the left lateral lower extremity ulcer. He is been tolerating the dressing changes without complication. Fortunately there does not appear to be any evidence of infection at this time. He is still just using triple antibiotic ointment along with clobetasol occasionally over the area. He never got the Santyl and really does not seem to intend to in my pinion. 06/01/18 on evaluation today patient appears to be doing a little better in regard to his left lateral lower extremity ulcer. He states that overall he does not feel like he is doing as well with the Dapsone as he did with the prednisone. Nonetheless he sees his dermatologist later today and is gonna talk to them about the possibility of going back on the prednisone. Overall again I believe that the wound would be better if you would utilize Santyl but he really does not seem to be interested in going back to the [facility] at this point. He has been using triple antibiotic ointment. 06/15/18 on evaluation today patient's wound actually appears to be doing about the same at this point. Fortunately there is no signs of infection at this time. He has made slight improvements although he continues to not really want to clean the wound bed at this point. He states that he just doesn't mess with it he doesn't want to cause any problems with everything else he has going on. He has been on medication, antibiotics as prescribed  by his dermatologist, for a staff infection of his lower extremities which is really drying out now and looking much better he tells me. Fortunately there is no sign of overall infection. 06/29/18 on evaluation today patient appears to be doing well in regard to his left lateral lower surety ulcer all things considering. Fortunately his staff infection seems to be greatly improved compared to previous. He has no signs of infection and this is drying up quite nicely. He is still the doxycycline for this is no longer on cental, Dapsone, or any of the other medications. His dermatologist has recommended possibility of an infusion but right now he does not want to proceed with that. 07/13/18 on evaluation today patient appears to be doing about the same in regard to his left lateral lower surety ulcer. Fortunately there's no signs of infection at this time which is great news. Unfortunately he still builds up a significant amount of Slough/biofilm of the surface of the wound he still is not really cleaning this as he should be appropriately. Again I'm able to easily with saline and gauze remove the majority of this on the surface which if you would do this at home would likely be a dramatic improvement for him as far as getting the area to improve. Nonetheless overall I still feel like he  is making progress is just very slow. I think Santyl will be of benefit for him as well. Still he has not gotten this as of this point. 07/27/18 on evaluation today patient actually appears to be doing little worse in regards of the erythema around the periwound region of the wound he also tells me that he's been having more drainage currently compared to what he was experiencing last time I saw him. He states not quite as bad as what he had because this was infected previously but nonetheless is still appears to be doing poorly. Fortunately there is no evidence of systemic infection at this point. The patient tells me that  he is not going to be able to afford the Santyl. He is still waiting to hear about the infusion therapy with his dermatologist. Apparently she wants an updated colonoscopy first. 08/10/18 on evaluation today patient appears to be doing better in regard to his left lateral lower extremity ulcer. Fortunately he is showing signs of improvement in this regard he's actually been approved for Remicade infusion's as well although this has not been scheduled as of yet. Fortunately there's no signs of active infection at this time in regard to the wound although he is having some issues with infection of the right lower extremity is been seen as dermatologist for this. Fortunately they are definitely still working with him trying to keep things under control. [name], [name] ([id number]) 09/07/18 on evaluation today patient is actually doing rather well in regard to his left lateral lower extremity ulcer. He notes these actually having some hair grow back on his extremity which is something he has not seen in years. He also tells me that the pain is really not giving them any trouble at this time which is also good news overall she is very pleased with the progress he's using a combination of the mupirocin along with the probate is all mixed. 09/21/18 on evaluation today patient actually appears to be doing fairly well all things considered in regard to his looks from the ulcer. He's been tolerating the dressing changes without complication. Fortunately there's no signs of active infection at this time which is good news he is still on all antibiotics or prevention of the staff infection. He has been on prednisone for time although he states it is gonna contact his dermatologist and see if she put them on a short course due to some irritation that he has going on currently. Fortunately there's no evidence of any overall worsening this is going very slow I think cental would be something that would be helpful for him  although he states that $50 for tube is quite expensive. He therefore is not willing to get that at this point. 10/06/18 on evaluation today patient actually appears to be doing decently well in regard to his left lateral leg ulcer. He's been tolerating the dressing changes without complication. Fortunately there's no signs of active infection at this time. Overall I'm actually rather pleased with the progress he's making although it's slow he doesn't show any signs of infection and he does seem to be making some improvement. I do believe that he may need a switch up and dressings to try to help this to heal more appropriately and quickly. 10/19/18 on evaluation today patient actually appears to be doing better in regard to his left lateral lower extremity ulcer. This is shown signs of having much less Slough buildup at this point due to the fact he has been using  the Santyl. Obviously this is very good news. The overall size of the wound is not dramatically smaller but again the appearance is. 11/02/18 on evaluation today patient actually appears to be doing quite well in regard to his lower [place] ulcer. A lot of the skin around the ulcer is actually somewhat irritating at this point this seems to be more due to the dressing causing irritation from the adhesive that anything else. Fortunately there is no signs of active infection at this time. 11/24/18 on evaluation today patient appears to be doing a little worse in regard to his overall appearance of his lower extremity ulcer. There's more erythema and warmth around the wound unfortunately. He is currently on doxycycline which he has been on for some time. With that being said I'm not sure that seems to be helping with what appears to possibly be an acute cellulitis with regard to his left lower extremity ulcer. No fevers, chills, nausea, or vomiting noted at this time. 12/08/18 on evaluation today patient's wounds actually appears to be doing  significantly better compared to his last evaluation. He has been using Santyl along with alternating tripling about appointment as well as the steroid cream seems to be doing quite well and the wound is showing signs of improvement which is excellent news. Fortunately there's no evidence of infection and in fact his culture came back negative with only normal skin flora noted. 12/21/2018 upon evaluation today patient actually appears to be doing excellent with regard to his ulcer. This is actually the best that I have seen it since have been helping to take care of him. It is both smaller as well as less slough noted on the surface of the wound and seems to be showing signs of good improvement with new skin growing from the edges. He has been using just the triamcinolone he does wonder if he can get a refill of that ointment today. 01/04/2019 upon evaluation today patient actually appears to be doing well with regard to his left lateral lower extremity ulcer. With that being said it does not appear to be that he is doing quite as well as last time as far as progression is concerned. There does not appear to be any signs of infection or significant irritation which is good news. With that being said I do believe that he may benefit from switching to a collagen based dressing based on how clean The wound appears. 01/18/2019 on evaluation today patient actually appears to be doing well with regard to his wound on the left lower extremity. He is not made a lot of progress compared to where we were previous but nonetheless does seem to be doing okay at this time which is good news. There is no signs of active infection which is also good news. My only concern currently is I do wish we can get him into utilizing the collagen dressing his insurance would not pay for the supplies that we ordered although it appears that he may be able to order this through his supply company that he typically utilizes. This is  [employer]. Nonetheless he did try to order it during the office visit today and it appears this did go through. We will see if he can get that it is a different brand but nonetheless he has collagen and I do think will be beneficial. 02/01/2019 on evaluation today patient actually appears to be doing a little worse today in regard to the overall size of his wounds. Fortunately there  is no signs of active infection at this time. That is visually. Nonetheless when this is happened before it was due to infection. For that reason were somewhat concerned about that this time as well. 02/08/2019 on evaluation today patient unfortunately appears to be doing slightly worse with regard to his wound upon evaluation today. Is measuring a little deeper and a little larger unfortunately. I am not really sure exactly what is causing this to enlarge he actually did see his dermatologist she is going to see about initiating Humira for him. Subsequently she also did do steroid injections into the wound itself in the periphery. Nonetheless still nonetheless he seems to be getting a little bit larger he is gone back to just using the steroid cream topically which I think is appropriate. I would say hold off on the collagen for the time being is definitely a good thing to do. Based on the culture results which we finally did get the final result back regarding it shows staph as the bacteria noted again that can be a normal skin bacteria based on the fact however he is having increased drainage and worsening of the wound measurement wise I would go ahead and place him on an antibiotic today I do believe for this. 02/15/2019 on evaluation today patient actually appears to be doing somewhat better in regard to his ulcer. There is no signs of worsening at this time I did review his culture results which showed evidence of Staphylococcus aureus but not MRSA. Again this could just be more related to the normal skin bacteria  although he states the drainage has slowed down quite a bit he may have had a mild infection not just colonization. And was much smaller and then since around10/04/2019 on evaluation today patient appears to be doing unfortunately worse as far as the size of the wound. I really feel like that this is steadily getting larger again it had been doing excellent right at the beginning of September we have seen a steady increase in the area of the wound it is almost 2-1/2 times the size it was on September 1. Obviously this is a bad trend this is not wanting to see. For that reason we went back to using just the topical triamcinolone cream which does seem to help with inflammation. I checked him for bacteria by way of culture and nothing showed positive there. I am considering giving him a short course of a tapering steroid Dosepak today to see if that is can be beneficial for him. The patient is in agreement with giving that a try. 03/08/2019 on evaluation today patient appears to be doing very well in comparison to last evaluation with regard to his lower extremity ulcer. This is showing signs of less inflammation and actually measuring slightly smaller compared to last time every other week over the past month and a half he has been measuring larger larger larger. Nonetheless I do believe that the issue has been inflammation the prednisone does seem to [facility] [place] ([id number]) have been beneficial for him which is good news. No fevers, chills, nausea, vomiting, or diarrhea. 03/22/2019 on evaluation today patient appears to be doing about the same with regard to his leg ulcer. He has been tolerating the dressing changes without complication. With that being said the wound seems to be mostly arrested at its current size but really is not making any progress except for when we prescribed the prednisone. He did show some signs of dropping as far as  the overall size of the wound during that interval week.  Nonetheless this is something he is not on long-term at this point and unfortunately I think he is getting need either this or else the Humira which his dermatologist has discussed try to get approval for. With that being said he will be seeing his dermatologist on the 11th of this month that is November. 04/19/2019 on evaluation today patient appears to be doing really about the same the wound is measuring slightly larger compared to last time I saw him. He has not been into the office since November 2 due to the fact that he unfortunately had Covid as that his entire family. He tells me that it was rough but they did pull-through and he seems to be doing much better. Fortunately there is no signs of active infection at this time. No fevers, chills, nausea, vomiting, or diarrhea. 05/10/2019 on evaluation today patient unfortunately appears to be doing significantly worse as compared to last time I saw him. He does tell me that he has had his first dose of Humira and actually is scheduled to get the next one in the upcoming week. With that being said he tells me also that in the past several days he has been having a lot of issues with green drainage she showed me a picture this is more blue-green in color. He is also been having issues with increased sloughy buildup and the wound does appear to be larger today. Obviously this is not the direction that we want everything to take based on the starting of his Humira. Nonetheless I think this is definitely a result of likely infection and to be honest I think this is probably Pseudomonas causing the infection based on what I am seeing. 05/24/2019 on evaluation today patient unfortunately appears to be doing significantly worse compared to his prior evaluation with me 2 weeks ago. I did review his culture results which showed that he does have Staph aureus as well as Pseudomonas noted on the culture. Nonetheless the Levaquin that I prescribed for him does  not appear to have been appropriate and in fact he tells me he is no longer experiencing the green drainage and discharge that he had at the last visit. Fortunately there is no signs of active infection at this time which is good news although the wound has significantly worsened it in fact is much deeper than it was previous. We have been utilizing up to this point triamcinolone ointment as the prescription topical of choice but at this time I really feel like that the wound is getting need to be packed in order to appropriately manage this due to the deeper nature of the wound. Therefore something along the lines of an alginate dressing may be more appropriate. 05/31/2019 upon inspection today patient's wound actually showed signs of doing poorly at this point. Unfortunately he just does not seem to be making any good progress despite what we have tried. He actually did go ahead and pick up the Cipro and start taking that as he was noticing more green drainage he had previously completed the Levaquin that I prescribed for him as well. Nonetheless he missed his appointment for the seventh last week on Wednesday with the wound care center and [facility] where his dermatologist referred him. Obviously I do think a second opinion would be helpful at this point especially in light of the fact that the patient seems to be doing so poorly despite the fact  that we have tried everything that I really know how at this point. The only thing that ever seems to have helped him in the past is when he was on high doses of continual steroids that did seem to make a difference for him. 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 06/07/2019 upon evaluation today patient unfortunately appears to be doing worse yet again with regard to his wound. In fact I am  starting to question whether or not he may have a fluid pocket in the muscle at this point based on the bulging and the soft appearance to the central portion of the muscle area. There is not anything draining from the muscle itself at this time which is good news but nonetheless the wound is expanding. I am not really seeing any results of the Humira as far as overall wound progression based on what I am seeing at this point. The patient has been referred for second opinion with regard to his wound to the [facility] wound care center by his dermatologist which I definitely am not in opposition to. Unfortunately we tried multiple dressings in the past including collagen, alginate, and at one point even Hydrofera Blue. With that being said he is never really used it for any significant amount of time due to the fact that he often complains of pain associated with these dressings and then will go back to either using the Santyl which she has done intermittently or more frequently the triamcinolone. He is also using his own compression stockings. We have wrapped him in the past but again that was something else that he really was not a big fan of. 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 He seemed to do much better when he was on the steroids although honestly I understand that the steroids are not necessarily the best medication to be on long-term obviously 06/14/2019 on evaluation today patient appears to be doing actually a little bit better with regard to the overall appearance with his leg. Unfortunately he does continue to have issues with what appears to be some fluid underneath the  muscle although he did see the wound specialty center at [facility] last week their main goals were to see about infusion therapy in place of the Humira as they feel like that is not quite strong enough. They also recommended that we continue with the treatment otherwise as we are they felt like that was appropriate and they are okay with him continuing to follow-up here with [place] in that regard. With that being said they are also sending him to the vein specialist there to see about vein stripping and if that would be of benefit for him. Subsequently they also did not really address whether or not an ultrasound of the muscle area to see if there is anything that needs to be addressed here would be appropriate or not. For that reason I discussed this with him last week I think we  may proceed down that road at this point. 06/21/2019 upon evaluation today patient's wound actually appears to be doing slightly better compared to previous evaluations. I do believe that he has made a difference with regard to the progression here with the use of oral steroids. Again in the past has been the only thing that is really calm things down. He does tell me that from [facility] is gotten a good news from there that there are no further vein stripping that is necessary at this point. I do not have that available for review today although the patient did relay this to me. He also did obtain and have the ultrasound of the wound completed which I did sign off on today. It does appear that there is no fluid collection under the muscle this is likely then just edematous tissue in general. That is also good news. Overall I still believe the inflammation is the main issue here. He did inquire about the possibility of a wound VAC again with the muscle protruding like it is I am not really sure whether the wound VAC is necessarily ideal or not. That is something we will have to consider although I do believe he may need compression wrapping to  try to help with edema control which could potentially be of benefit. 06/28/2019 on evaluation today patient appears to be doing slightly better measurement wise although this is not terribly smaller he least seems to be trending towards that direction. With that being said he still seems to have purulent drainage noted in the wound bed at this time. He has been on Levaquin followed by Cipro over the past month. Unfortunately he still seems to have some issues with active infection at this time. I did perform a culture last week in order to evaluate and see if indeed there was still anything going on. Subsequently the culture did come back showing Pseudomonas which is consistent with the drainage has been having which is blue-green in color. He also has had an odor that again was somewhat consistent with Pseudomonas as well. Long story short it appears that the culture showed an intermediate finding with regard to how well the Cipro will work for the Pseudomonas infection. Subsequently being that he does not seem to be clearing up and at best what we are doing is just keeping this at [employer] I think he may need to see infectious disease to discuss IV antibiotic options. TARELL, SCHOLLMEYER ([id number]) 07/05/2019 upon evaluation today patient appears to be doing okay in regard to his leg ulcer. He has been tolerating the dressing changes at this point without complication. Fortunately there is no signs of active infection at this time which is good news. No fevers, chills, nausea, vomiting, or diarrhea. With that being said he does have an appointment with infectious disease tomorrow and his primary care on Wednesday. Again the reason for the infectious disease referral was due to the fact that he did not seem to be fully resolving with the use of oral antibiotics and therefore we were thinking that IV antibiotic therapy may be necessary secondary to the fact that there was an intermediate finding for  how effective the Cipro may be. Nonetheless again he has been having a lot of purulent and even green drainage. Fortunately right now that seems to have calmed down over the past week with the reinitiation of the oral antibiotic. Nonetheless we will see what Dr. Megan Salon has to say. 07/12/2019 upon evaluation today patient appears to be  doing about the same at this point in regard to his left lower extremity ulcer. Fortunately there is no signs of active infection at this time which is good news I do believe the Levaquin has been beneficial I did review Dr. Hale Bogus note and to be honest I agree that the patient's leg does appear to be doing better currently. What we found in the past as he does not seem to really completely resolve he will stop the antibiotic and then subsequently things will revert back to having issues with blue-green drainage, increased pain, and overall worsening in general. Obviously that is the reason I sent him back to infectious disease. 07/19/2019 upon evaluation today patient appears to be doing roughly the same in size there is really no dramatic improvement. He has started back on the Levaquin at this point and though he seems to be doing okay he did still have a lot of blue/green drainage noted on evaluation today unfortunately. I think that this is still indicative more likely of a Pseudomonas infection as previously noted and again he does see Dr. Megan Salon in just a couple of days. I do not know that were really able to effectively clear this with just oral antibiotics alone based on what I am seeing currently. Nonetheless we are still continue to try to manage as best we can with regard to the patient and his wound. I do think the wrap was helpful in decreasing the edema which is excellent news. No fevers, chills, nausea, vomiting, or diarrhea. 07/26/2019 upon evaluation today patient appears to be doing slightly better with regard to the overall appearance of the muscle  there is no dark discoloration centrally. Fortunately there is no signs of active infection at this time. No fevers, chills, nausea, vomiting, or diarrhea. Patient's wound bed currently the patient did have an appointment with Dr. Megan Salon at infectious disease last week. With that being said Dr. Megan Salon the patient states was still somewhat hesitant about put him on any IV antibiotics he wanted [place] to repeat cultures today and then see where things go going forward. He does look like Dr. Megan Salon because of some improvement the patient did have with the Levaquin wanted [place] to see about repeating cultures. If it indeed grows the Pseudomonas again then he recommended a possibility of considering a PICC line placement and IV antibiotic therapy. He plans to see the patient back in 1 to 2 weeks. 08/02/2019 upon evaluation today patient appears to be doing poorly with regard to his left lower extremity. We did get the results of his culture back it shows that he is still showing evidence of Pseudomonas which is consistent with the purulent/blue-green drainage that he has currently. Subsequently the culture also shows that he now is showing resistance to the oral fluoroquinolones which is unfortunate as that was really the only thing to treat the infection prior. I do believe that he is looking like this is going require IV antibiotic therapy to get this under control. Fortunately there is no signs of systemic infection at this time which is good news. The patient does see Dr. Megan Salon tomorrow. 08/09/2019 upon evaluation today patient appears to be doing better with regard to his left lower extremity ulcer in regard to the overall appearance. He is currently on IV antibiotic therapy. As ordered by Dr. Megan Salon. Currently the patient is on ceftazidime which she is going to take for the next 2 weeks and then follow-up for 4 to 5-week appointment with Dr. Megan Salon.  The patient started this this past Friday  symptoms have not for a total of 3 days currently in full. 08/16/2019 upon evaluation today patient's wound actually does show muscle in the base of the wound but in general does appear to be much better as far as the overall evidence of infection is concerned. In fact I feel like this is for the most part cleared up he still on the IV antibiotics he has not completed the full course yet but I think he is doing much better which is excellent news. 08/23/2019 upon evaluation today patient appears to be doing about the same with regard to his wound at this point. He tells me that he still has pain unfortunately. Fortunately there is no evidence of systemic infection at this time which is great news. There is significant muscle protrusion. 09/13/19 upon evaluation today patient appears to be doing about the same in regard to his leg unfortunately. He still has a lot of drainage coming from the ulceration there is still muscle exposed. With that being said the patient's last wound culture still showed an intermediate finding with regard to the Pseudomonas he still having the bluish/green drainage as well. Overall I do not know that the wound has completely cleared of infection at this point. Fortunately there is no signs of active infection systemically at this point which is good news. 09/20/2019 upon evaluation today patient's wound actually appears to be doing about the same based on what I am seeing currently. I do not see any signs of systemic infection he still does have evidence of some local infection and drainage. He did see Dr. Megan Salon last week and Dr. Megan Salon states that he probably does need a different IV antibiotic although he does not want to put him on this until the patient begins the Remicade infusion which is actually scheduled for about 10 days out from today on 13 May. Following that time Dr. Megan Salon is good to see him back and then will evaluate the feasibility of starting him on the  IV antibiotic therapy once again at that point. I do not disagree with this plan I do believe as Dr. Megan Salon stated in his note that I reviewed today that the patient's issue is multifactorial with the pyoderma being 1 aspect of this that were hoping the Remicade will be helpful for her. In the meantime I think the gentamicin is, helping to keep things under decent okay control in regard to the ulcer. 09/27/2019 upon evaluation today patient appears to be doing about the same with regard to his wound still there is a lot of muscle exposure though he does have some hyper granulation tissue noted around the edge and actually some granulation tissue starting to form over the muscle which is actually good news. Fortunately there is no evidence of active infection which is also good news. His pain is less at this point. 5/21; this is a patient I have not seen in a long time. 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 He has a large wound on the left lateral leg with protruding muscle. He comes in the clinic today showing the same area on his left medial ankle. He says there is been a spot there for some time although we have not previously defined this. Today he has a clearly defined area with slight amount of skin breakdown surrounded by raised areas with a purplish hue in color. This is not painful he says it is irritated.  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 He still has muscle protruding from the lateral portion of his left leg but fortunately the new area that they were concerned about at his last visit does not appear to have opened at this point. He is currently on Remicade infusions and seems to be doing better in my opinion in fact the wound itself seems to be overall much better. The purplish discoloration that he did have seems to have resolved  and I think that is a good sign that hopefully the Remicade is doing its job. He does have some biofilm noted over the surface of the wound. 11/01/2019 on evaluation today patient's wound actually appears to be doing excellent at this time. Fortunately there is no evidence of active infection and overall I feel like he is making great progress. The Remicade seems to be due excellent job in my opinion. IMAN, OROURKE ([id number]) 11/08/19 evaluation today vision actually appears to be doing quite well with regard to his weight ulcer. He's been tolerating dressing changes without complication. Fortunately there is no evidence of infection. No fevers, chills, nausea, or vomiting noted at this time. Overall states that is having more itching than pain which is actually a good sign in my opinion. 12/13/2019 upon evaluation today patient appears to be doing well today with regard to his wound. He has been tolerating the dressing changes without complication. Fortunately there is no sign of active infection at this time. No fevers, chills, nausea, vomiting, or diarrhea. Overall I feel like the infusion therapy has been very beneficial for him. 01/06/2020 on evaluation today patient appears to be doing well with regard to his wound. This is measuring smaller and actually looks to be doing better. Fortunately there is no signs of active infection at this point. No fevers, chills, nausea, vomiting, or diarrhea. With that being said he does still have the blue-green drainage but this does not seem to be causing any significant issues currently. He has been using the gentamicin that does seem to be keeping things under decent control at this point. He goes later this morning for his next infusion therapy for the pyoderma which seems to also be very beneficial. 02/07/2020 on evaluation today patient appears to be doing about the same in regard to his wounds currently. Fortunately there is no signs of active infection  systemically he does still have evidence of local infection still using gentamicin. He also is showing some signs of improvement albeit slowly I do feel like we are making some progress here. 02/21/2020 upon evaluation today patient appears to be making some signs of improvement the wound is measuring a little bit smaller which is great news and overall I am very pleased with where he stands currently. He is going to be having infusion therapy treatment on the 15th of this month. Fortunately there is no signs of active infection at this time. 03/13/2020 I do believe patient's wound is actually showing some signs of improvement here which is great news. He has continue with the infusion therapy through rheumatology/dermatology at [facility]. That does seem to be beneficial. I still think he gets as much benefit from this as he did from the prednisone initially but nonetheless obviously this is less harsh on his body that the prednisone as far as they are concerned. 03/31/2020 on evaluation today patient's wound actually showing signs of some pretty good improvement in regard  to the overall appearance of the wound bed. There is still muscle exposed though he does have some epithelial growth around the edges of the wound. Fortunately there is no signs of active infection at this time. No fevers, chills, nausea, vomiting, or diarrhea. 04/24/2020 upon evaluation today patient appears to be doing about the same in regard to his leg ulcer. He has been tolerating the dressing changes without complication. Fortunately there is no signs of active infection at this time. No fevers, chills, nausea, vomiting, or diarrhea. With that being said he still has a lot of irritation from the bandaging around the edges of the wound. We did discuss today the possibility of a referral to plastic surgery. 05/22/2020 on evaluation today patient appears to be doing well with regard to his wounds all things considered. He has not been able  to get the Chantix apparently there is a recall nurse that I was unaware of put out by [employer] involuntarily. Nonetheless for now I am and I have to do some research into what may be the best option for him to help with quitting in regard to smoking and we discussed that today. 06/26/2020 upon evaluation today patient appears to be doing well with regard to his wound from the standpoint of infection I do not see any signs of infection at this point. With that being said unfortunately he is still continuing to have issues with muscle exposure and again he is not having a whole lot of new skin growth unfortunately. There does not appear to be any signs of active infection at this time. No fevers, chills, nausea, vomiting, or diarrhea. 07/10/2020 upon evaluation today patient appears to be doing a little bit more poorly currently compared to where he was previous. I am concerned currently about an active infection that may be getting worse especially in light of the increased size and tenderness of the wound bed. No fevers, chills, nausea, vomiting, or diarrhea. 07/24/2020 upon evaluation today patient appears to be doing poorly in regard to his leg ulcer. He has been tolerating the dressing changes without complication but unfortunately is having a lot of discomfort. 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 Subsequently I think he is going require possibly IV antibiotics to get this under control. I am very concerned about the severity of his infection and the amount of discomfort he is having. 07/31/2020 upon evaluation today patient appears to be doing about the same in regard to his leg wound. He did see Dr. Megan Salon and Dr. Megan Salon is actually going to start him on IV antibiotics. He goes for the PICC line tomorrow. With that being said there do not have that run for 2 weeks and then see how things are doing and depending on how he  is progressing they may extend that a little longer. Nonetheless I am glad this is getting ready to be in place and definitely feel it may help the patient. In the meantime is been using mainly triamcinolone to the wound bed has an anti-inflammatory. 08/07/2020 on evaluation today patient appears to be doing well with regard to his wound compared even last week. In the interim he has gotten the PICC line placed and overall this seems to be doing excellent. There does not appear to be any evidence of infection which is great news systemically although locally of course has had the infection this appears to be improving with the use of the antibiotics. 08/14/2020 upon  evaluation today patient's wound actually showing signs of excellent improvement. Overall the irritation has significantly improved the drainage is back down to more of a normal level and his pain is really pretty much nonexistent compared to what it was. Obviously I think that this is significantly improved secondary to the IV antibiotic therapy which has made all the difference in the world. Again he had a resistant form of Pseudomonas for which oral antibiotics just was not cutting it. Nonetheless I do think that still we need to consider the possibility of a surgical closure for this wound is been open so long and to be honest with muscle exposed I think this can be very hard to get this to close outside of this although definitely were still working to try to do what we can in that regard. 08/21/2020 upon evaluation today patient appears to be doing very well with regard to his wounds on the left lateral lower extremity/calf area. Fortunately there does not appear to be signs of active infection which is great news and overall very pleased with where things stand today. He is actually wrapping up his treatment with IV antibiotics tomorrow. After that we will see where things go from there. 08/28/2020 upon evaluation today patient appears  to be doing decently well with regard to his leg ulcer. There does not appear to be any signs of active infection which is great news and overall very pleased with where things stand today. No fevers, chills, nausea, vomiting, or diarrhea. 09/18/2020 upon evaluation today patient appears to be doing well with regard to his infection which I feel like is better. Unfortunately he is not doing as well with regard to the overall size of the wound which is not nearly as good at this point. I feel like that he may be having an issue here with the pyoderma being somewhat out of control. I think that he may benefit from potentially going back and talking to the dermatologist about TEEGAN, [name] ([id number]) what to do from the pyoderma standpoint. I am not certain if the infusions are helping nearly as much is what the prednisone did in the past. 10/02/2020 upon evaluation today patient appears to be doing well with regard to his leg ulcer. He did go to the Psychiatric nurse. Unfortunately they feel like there is a 10% chance that most that he would be able to heal and that the skin graft would take. Obviously this has led him to not be able to go down that path as far as treatment is concerned. Nonetheless he does seem to be doing a little bit better with the prednisone that I gave him last time. I think that he may need to discuss with dermatology the possibility of long-term prednisone as that seems to be what is most helpful for him to be perfectly honest. I am not sure the Remicade is really doing the job. 10/17/2020 upon evaluation today patient appears to be doing a little better in regard to his wound. In fact the case has been since we did the prednisone on May 2 for him that we have noticed a little bit of improvement each time we have seen a size wise as well as appearance wise as well as pain wise. I think the prednisone has had a greater effect then the infusion therapy has to be perfectly honest. With  that being said the patient also feels significantly better compared to what he was previous. All of this is good news but  nonetheless I am still concerned about the fact that again we are really not set up to long-term manage him as far as prednisone is concerned. Obviously there are things that you need to be watched I completely understand the risk of prednisone usage as well. 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 With all that being said I do believe that we can give him another round of the prednisone which she is requesting today because of the improvement that he seen since we did that first round. 10/30/2020 upon evaluation today patient's wound actually is showing signs of doing quite well. There does not appear to be any evidence of infection which is great news and overall very pleased with where things stand today. No fevers, chills, nausea, vomiting, or diarrhea. He tells me that the prednisone still has seem to have helped he wonders if we can extend that for just a little bit longer. He did not have the appointment with a dermatologist although he did have an infusion appointment last Friday. That was at [facility]. With that being said he tells me he could not do both that as well as the appointment with the physician on the same day therefore that is can have to be rescheduled. I really want to see if there is anything they feel like that could be done differently to try to help this out as I am not really certain that the infusions are helping significantly here. 11/13/2020 upon evaluation today patient unfortunately appears to be doing somewhat poorly in regard to his wound I feel like this is actually worsening from the standpoint of the pyoderma spreading. I still feel like that he may need something different as far as trying to manage this going forward. Again we did the prednisone unfortunately his blood sugars are not doing so well  because of this. Nonetheless I believe that the patient likely needs to try topical steroid. We have done triamcinolone for a while I think going with something stronger such as clobetasol could be beneficial again this is not something I do lightly I discussed this with the patient that again this does not normally put underneath an occlusive dressing. Nonetheless I think a thin film as such could help with some of the stronger anti-inflammatory effects. We discussed this today. He would like to try to give this a trial for the next couple weeks. I definitely think that is something that we can do. Evaluate7/03/2021 and today patient's wound bed actually showed signs of doing really about the same. There was a little expansion of the size of the wound and that leading edge that we done looking out although the clobetasol does seem to have slowed this down a bit in my opinion. There is just 1 small area that still seems to be progressing based on what I see. Nonetheless I am concerned about the fact this does not seem to be improving if anything seems to be doing a little bit worse. I do not know that the infusions are really helping him much as next infusion is August 5 his appointment with dermatology is July 25. Either way I really think that we need to have a conversation potentially about this and I am actually going to see if I can talk with Dr. Lillia Carmel in order to see where things stand as well. 12/11/2020 upon evaluation today patient appears to be doing worse in regard to his leg ulcer. Unfortunately  I just do not think this is making the progress that I would like to see at this point. Honestly he does have an appointment with dermatology and this is in 2 days. I am wondering what they may have to offer to help with this. Right now what I am seeing is that he is continuing to show signs of worsening little by little. Obviously that is not great at all. Is the exact opposite of what we are  looking for. 12/18/2020 upon evaluation today patient appears to be doing a little better in regard to his wound. The dermatologist actually did do some steroid injections into the wound which does seem to have been beneficial in my opinion. That was on the 25th already this looks a little better to me than last time I saw him. With that being said we did do a culture and this did show that he has Staph aureus noted in abundance in the wound. With that being said I do think that getting him on an oral antibiotic would be appropriate as well. Also think we can compression wrap and this will make a difference as well. Electronic Signature(s) Signed: 12/18/2020 9:14:46 AM By: Worthy Keeler PA-C Entered By: Worthy Keeler on 12/18/2020 09:14:45 [name], [name] ([id number]) -------------------------------------------------------------------------------- Physical Exam Details Patient Name: [name] Date of Service: 12/18/2020 8:15 AM Medical Record Number: [id number] Patient Account Number: [id number] Date of Birth/Sex: 1981/01/20 (39 y.o. M) Treating RN: Dolan Amen Primary Care Provider: [name] Other Clinician: Referring Provider: [name] Treating Provider/Extender: [name] Cliche in Treatment: 52 Constitutional Well-nourished and well-hydrated in no acute distress. Respiratory normal breathing without difficulty. Psychiatric this patient is able to make decisions and demonstrates good insight into disease process. Alert and Oriented x 3. pleasant and cooperative. Notes Upon inspection patient's wound bed actually showed signs of good granulation and epithelization at this point. That is in some areas around the edges where to be honest this has appeared to be very inflamed over the past several weeks this is significantly better compared to before and I think this is attributed to the steroid injections to be honest. His dermatologist is also considering upping some  of his infusion medications or else to be honest trying to do a different steroid to see if that would be better for him. Either way I think something needs to be done here as it does seem to be that this response of the steroids much more effectively than anything else we have been doing up to this point. I am also going to get him started on antibiotics today. Electronic Signature(s) Signed: 12/18/2020 9:15:30 AM By: Worthy Keeler PA-C Entered By: Worthy Keeler on 12/18/2020 09:15:29 [name], [name] ([id number]) -------------------------------------------------------------------------------- Physician Orders Details Patient Name: [name] Date of Service: 12/18/2020 8:15 AM Medical Record Number: [id number] Patient Account Number: [id number] Date of Birth/Sex: 09/22/1980 (39 y.o. 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 Bathing/ Shower/ Hygiene o Clean wound with Normal Saline or wound cleanser. Edema Control -  Lymphedema / Segmental Compressive Device / Other o Optional: One layer of unna paste to top of compression wrap (to act as an anchor). o 4 Layer Compression System Lymphedema. o Other: - Call [facility] for knee high compression socks 20/36m/Hg Medications-Please add to medication list. o P.O. Antibiotics Wound Treatment Wound #1 - Lower Leg Wound  Laterality: Left, Lateral Cleanser: Soap and Water Every Other Day/30 Days Discharge Instructions: Gently cleanse wound with antibacterial soap, rinse and pat dry prior to dressing wounds Topical: Triamcinolone Acetonide Cream, 0.1%, 15 (g) tube Every Other Day/30 Days Discharge Instructions: Apply in office only to 7 o'clock area Primary Dressing: Aquacel Extra Hydrofiber Dressing, 4x5 (in/in) Every Other Day/30 Days Secondary Dressing: Xtrasorb Medium 4x5 (in/in) Every Other Day/30 Days Discharge Instructions: Apply to wound as directed. Do not cut. Compression Wrap: Medichoice 4 layer Compression System, 35-40 mmHG (Generic) Every Other Day/30 Days Discharge Instructions: Apply multi-layer wrap as directed. Patient Medications Allergies: milk, Biaxin, seasonal Notifications Medication Indication Start End Bactrim DS 12/18/2020 DOSE 1 - oral 800 mg-160 mg tablet - 1 tablet oral taken 2 times per day for 14 days Electronic Signature(s) LIZAYA, NETHERTON([id number] Signed: 12/18/2020 9:16:39 AM By: SWorthy KeelerPA-C Entered By: SWorthy Keeleron 12/18/2020 09:16:38 Sailer, RHerbie Baltimore([id number] -------------------------------------------------------------------------------- Problem List Details Patient Name: LHaynes KernsDate of Service: 12/18/2020 8:15 AM Medical Record Number: [id number] Account Number: [id number] of Birth/Sex: 901-Nov-1982(39 y.o. M) Treating RN: SDolan AmenPrimary Care Provider: CAlma FriendlyOther Clinician: Referring Provider: CAlma FriendlyTreating Provider/Extender: SSkipper Clichein Treatment: 210 Active Problems ICD-10 Encounter Code Description Active Date MDM Diagnosis L97.222 Non-pressure chronic ulcer of left calf with fat layer exposed 12/04/2016 No Yes L88 Pyoderma gangrenosum 03/26/2017 No Yes L97.321 Non-pressure chronic ulcer of left ankle limited to breakdown of skin 10/08/2019 No Yes I87.2 Venous insufficiency (chronic)  (peripheral) 12/04/2016 No Yes L03.116 Cellulitis of left lower limb 05/24/2019 No Yes F17.208 Nicotine dependence, unspecified, with other nicotine-induced disorders 04/24/2020 No Yes Inactive Problems ICD-10 Code Description Active Date Inactive Date L97.213 Non-pressure chronic ulcer of right calf with necrosis of muscle 04/02/2017 04/02/2017 Resolved Problems Electronic Signature(s) Signed: 12/18/2020 4:30:46 PM By: SWorthy KeelerPA-C Entered By: SWorthy Keeleron 12/18/2020 08:20:53 Skare, RHerbie [place]([id number] -------------------------------------------------------------------------------- Progress Note Details Patient Name: LHaynes KernsDate of Service: 12/18/2020 8:15 AM Medical Record Number: [id number] Account Number: [id number] of Birth/Sex: 1982(39 y.o. M) Treating RN: SDolan AmenPrimary Care Provider: CAlma FriendlyOther Clinician: Referring Provider: CAlma FriendlyTreating Provider/Extender: SSkipper Clichein Treatment: 210 Subjective Chief Complaint Information obtained from Patient He is here in follow up evaluation for LLE pyoderma ulcer History of Present Illness (HPI) 12/04/16; 40year old man who comes into the clinic today for review of a wound on the posterior left calf. He tells me that is been there for about a year. He is not a diabetic he does smoke half a pack per day. He was seen in the ER on 11/20/16 felt to have cellulitis around the wound and was given clindamycin. An x-ray did not show osteomyelitis. The patient initially tells me that he has a milk allergy that sets off a pruritic itching rash on his lower legs which she scratches incessantly and he thinks that's what may have set up the wound. He has been using various topical antibiotics and ointments without any effect. He works in a [employer] and is on his feet all day. He does not  have a prior history of wounds however he does have the rash on both lower legs the right arm  and the ventral aspect of his left arm. These are excoriations and clearly have had scratching however there are of macular looking areas on both legs including a substantial larger area on the right leg. This does not have an underlying open area. There is no blistering. The patient tells me that 2 years ago in [place] in response to the rash on his legs he saw a dermatologist who told him he had a condition which may be pyoderma gangrenosum although I may be putting words into his mouth. He seemed to recognize this. 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 He is not in any treatment for this. He's had no recent travel 12/11/16; the patient arrives today with his wound and roughly the same condition we've been using silver alginate this is a deep punched out wound with some surrounding erythema but no tenderness. Biopsy I did did not show confirmed pyoderma gangrenosum suggested nonspecific inflammation and vasculitis but does not provide an actual description of what was seen by the pathologist. I'm really not able to understand this We have also received information from the patient's dermatologist in [place] notes from April 2016. This was a doctor Agarwal-antal. 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 He was prescribed topical steroid high potency under occlusion which helped but at this point the patient did not have a deep punched out wound. 12/18/16; the patient's wound is larger in terms of surface area however this surface looks better and there is less depth. The surrounding erythema also is better. The patient states that the wrap we put on came off 2 days ago when he has been using his compression stockings. He we are in the process of getting a dermatology consult. 12/26/16 on evaluation today patient's left lower extremity wound shows evidence of infection with surrounding erythema noted. He has been tolerating the dressing changes  but states that he has noted more discomfort. There is a larger area of erythema surrounding the wound. No fevers, chills, nausea, or vomiting noted at this time. With that being said the wound still does have slough covering the surface. He is not allergic to any medication that he is aware of at this point. In regard to his right lower extremity he had several regions that are erythematous and pruritic he wonders if there's anything we can do to help that. 01/02/17 I reviewed patient's wound culture which was obtained his visit last week. He was placed on doxycycline at that point. Unfortunately that does not appear to be an antibiotic that would likely help with the situation however the pseudomonas noted on culture is sensitive to Cipro. Also unfortunately patient's wound seems to have a large compared to last week's evaluation. Not severely so but there are definitely increased measurements in general. He is continuing to have discomfort as well he writes this to be a seven out of 10. In fact he would prefer me not to perform any debridement today due to the fact that he is having discomfort and considering he has an active infection on the little reluctant to do so anyway. No fevers, chills, nausea, or vomiting noted at this time. 01/08/17; patient seems dermatology on September 5. I suspect dermatology will want the slides from the biopsy I did sent to their pathologist. I'm not sure if there is a way we can  expedite that. In any case the culture I did before I left on vacation 3 weeks ago showed Pseudomonas he was given 10 days of Cipro and per her description of her intake nurses is actually somewhat better this week although the wound is quite a bit bigger than I remember the last time I saw this. He still has 3 more days of Cipro 01/21/17; dermatology appointment tomorrow. He has completed the ciprofloxacin for Pseudomonas. Surface of the wound looks better however he is had some deterioration  in the lesions on his right leg. Meantime the left lateral leg wound we will continue with sample 01/29/17; patient had his dermatology appointment but I can't yet see that note. He is completed his antibiotics. The wound is more superficial but considerably larger in circumferential area than when he came in. This is in his left lateral calf. He also has swollen erythematous areas with superficial wounds on the right leg and small papular areas on both arms. There apparently areas in her his upper thighs and buttocks I did not look at those. Dermatology biopsied the right leg. Hopefully will have their input next week. 02/05/17; patient went back to see his dermatologist who told him that he had a "scratching problem" as well as staph. He is now on a 30 day course of doxycycline and I believe she gave him triamcinolone cream to the right leg areas to help with the itching [not exactly sure but probably triamcinolone]. She apparently looked at the left lateral leg wound although this was not rebiopsied and I think felt to be ultimately part of the same pathogenesis. He is using sample border foam and changing nevus himself. He now has a new open area on the right posterior leg which was his biopsy site I don't have any of the dermatology notes 02/12/17; we put the patient in compression last week with SANTYL to the wound on the left leg and the biopsy. Edema is much better and the depth of the wound is now at level of skin. Area is still the same Biopsy site on the right lateral leg we've also been using santyl with a border foam dressing and he is changing this himself. 02/19/17; Using silver alginate started last week to both the substantial left leg wound and the biopsy site on the right wound. He is tolerating compression well. Has a an appointment with his primary M.D. tomorrow wondering about diuretics although I'm wondering if the edema problem is actually lymphedema [name], [name]  ([id number]) 02/26/17; the patient has been to see his primary doctor Dr. Jerrel Ivory at [facility] our primary care. She started him on Lasix 20 mg and this seems to have helped with the edema. However we are not making substantial change with the left lateral calf wound and inflammation. The biopsy site on the right leg also looks stable but not really all that different. 03/12/17; the patient has been to see vein and vascular Dr. Lucky Cowboy. He has had venous reflux studies I have not reviewed these. I did get a call from his dermatology office. 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 They therefore recommended clobetasol and tetracycline i.e. 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 03/26/17; apparently the patient  just had reflux studies not an appointment with Dr. dew. She arrives in clinic today having applied clobetasol for 2-3 weeks. He notes over the last 2-3 days excessive drainage having to change the dressing 3-4 times a day and also expanding erythema. 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 Lillia Carmel at [name] dermatology locally who kindly saw him at my request. 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 My concern was the degree of erythema medially extending from his left leg wound which was either inflammation from pyoderma or cellulitis. I put him on Augmentin however culture of the wound showed  Pseudomonas which is quinolone sensitive. I really don't believe he has cellulitis however in view of everything I will continue and give him a course of Cipro. He is also on doxycycline as an immune modulator for the pyoderma. In addition to his original wound on the left lateral leg with surrounding erythema he has a wound on the right posterior calf which was an original biopsy site done by dermatology. 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 Saw Dr. Lillia Carmel yesterday. He has been using topical antibiotics to both wound areas his original wound on the left and the biopsies/pathergy area on the right. There is definitely some improvement in the inflammation around the wound on the right although the patient states he has increasing sensitivity of the wounds. He is on prednisone 60 and doxycycline 1 as prescribed by Dr. Lillia Carmel. He is covering the topical antibiotic with gauze and putting this in his own compression stocks and changing this daily. He states that Dr. Lottie Rater did a culture of the left leg wound yesterday 05/07/17; pyoderma gangrenosum. The patient saw Dr. Lillia Carmel yesterday and has a follow-up with her in one month. He is still using topical antibiotics to both wounds although he can't recall exactly what type. He is still on prednisone 60 mg. Dr. Lillia Carmel stated that the doxycycline could stop if we were in agreement. 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 The right medial leg was induced by biopsy/pathergy. The area on the right is essentially healed. 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 The right medial leg has closed and remains closed. He is still on prednisone 60. He tells me he missed his last dermatology  appointment with Dr. Lillia Carmel but will make another appointment. He reports that her blood sugar at a recent screen in [place] was high 200's. He was 180 today. 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 I think he is going to require still much longer prednisone perhaps another 3 months before attempting to taper. In the meantime his wound is a lot better. Smaller. He is cleaning this off daily and applying topical antibiotics. When he was last in the clinic I thought about changing to [place] and actually put in a couple of calls to dermatology although probably not during their business hours. In any case the wound looks better smaller I don't think there is any need to change  what he is doing 08/06/17-he is here in follow up evaluation for pyoderma left leg ulcer. He continues on oral prednisone. He has been using triple antibiotic ointment. There is surface debris and we will transition to [facility] and have him return in 2 weeks. He has lost 30 pounds since his last appointment with lifestyle modification. He may benefit from topical steroid cream for treatment this can be considered at a later date. 08/22/17 on evaluation today patient appears to actually be doing rather well in regard to his left lateral lower extremity ulcer. He has actually been managed by Dr. Dellia Nims most recently. Patient is currently on oral steroids at this time. This seems to have been of benefit for him. Nonetheless his last visit was actually with Leah on 08/06/17. Currently he is not utilizing any topical steroid creams although this could be of benefit as well. No fevers, chills, nausea, or vomiting noted at this time. 09/05/17 on evaluation today patient appears to be doing better in regard to his left lateral lower extremity ulcer. He has been tolerating the dressing changes without complication. He is using Santyl with good effect. Overall I'm very pleased with how things are standing at this point. Patient  likewise is happy that this is doing better. 09/19/17 on evaluation today patient actually appears to be doing rather well in regard to his left lateral lower extremity ulcer. 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 He's not having any significant pain. 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 He has been tolerating the Santyl without complication and in general I feel like he's making good progress. 10/17/17 on evaluation today patient appears to be doing very well in regard to his left lateral lower surety ulcer. He has been tolerating the dressing changes without complication. There does not appear to be any evidence of infection he's alternating the Santyl and the triple antibiotic ointment every other day this seems to be doing well for him. 11/03/17 on evaluation today patient appears to be doing very well in regard to his left lateral lower extremity ulcer. He is been tolerating the dressing changes without complication which is good news. Fortunately there does not appear to be any evidence of infection which is also great news. Overall is doing excellent they are starting to taper down on the prednisone is down to 40 mg at this point it also started topical clobetasol for him. 11/17/17 on evaluation today patient appears to be doing well in regard to his left lateral lower surety ulcer. He's been tolerating the dressing changes without complication. He does note that he is having no pain, no excessive drainage or discharge, and overall he feels like things are going about how he would expect and hope they would. Overall he seems to have no evidence of infection at this time in my opinion which is good news. 12/04/17-He is seen in follow-up evaluation for right lateral lower extremity ulcer. He has been  applying topical steroid cream. Today's measurement show slight increase in size. Over the next 2 weeks we will transition to every other day Santyl and steroid cream. He has been encouraged to monitor for changes and notify clinic with any concerns 12/15/17 on evaluation today patient's left lateral motion the ulcer and fortunately is doing worse again at this point. This just since last  week to this week has close to doubled in size according to the patient. I did not seeing last week's I do not have a visual to compare this to in our system was also down so we do not have all the charts and at this point. Nonetheless it does have me somewhat concerned in regard to the fact that again he was worried enough about it he has contact the dermatology that placed them back on the full strength, 50 mg a day of the prednisone that he was taken previous. He continues to alternate using clobetasol along with Santyl at this point. He is obviously somewhat frustrated. [name], [name] ([id number]) 12/22/17 on evaluation today patient appears to be doing a little worse compared to last evaluation. Unfortunately the wound is a little deeper and slightly larger than the last week's evaluation. With that being said he has made some progress in regard to the irritation surrounding at this time unfortunately despite that progress that's been made he still has a significant issue going on here. 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 For that reason I discussed with him today the possibility of perform a wound culture to ensure there's nothing overtly infected. 01/06/18 on evaluation today patient's wound is larger and deeper than previously evaluated. With that being said it did appear that his wound was infected after my last evaluation with him. Subsequently I did end up  prescribing a prescription for Bactrim DS which she has been taking and having no complication with. Fortunately there does not appear to be any evidence of infection at this point in time as far as anything spreading, no want to touch, and overall I feel like things are showing signs of improvement. 01/13/18 on evaluation today patient appears to be even a little larger and deeper than last time. There still muscle exposed in the base of the wound. Nonetheless he does appear to be less erythematous I do believe inflammation is calming down also believe the infection looks like it's probably resolved at this time based on what I'm seeing. No fevers, chills, nausea, or vomiting noted at this time. 01/30/18 on evaluation today patient actually appears to visually look better for the most part. Unfortunately those visually this looks better he does seem to potentially have what may be an abscess in the muscle that has been noted in the central portion of the wound. This is the first time that I have noted what appears to be fluctuance in the central portion of the muscle. With that being said I'm somewhat more concerned about the fact that this might indicate an abscess formation at this location. I do believe that an ultrasound would be appropriate. This is likely something we need to try to do as soon as possible. He has been switch to mupirocin ointment and he is no longer using the steroid ointment as prescribed by dermatology he sees them again next week he's been decreased from 60 to 40 mg of prednisone. 03/09/18 on evaluation today patient actually appears to be doing a little better compared to last time I saw him. There's not as much erythema surrounding the wound itself. He I did review his most recent infectious disease note which was dated 02/24/18. He saw Dr. Michel Bickers in [place]. 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 Patient is now off of antibiotics and they are continually observing this. There seems to be no change in the past two weeks in my pinion based on what the patient says and what I see today compared to what Dr. Megan Salon likely saw two weeks ago. No fevers, chills, nausea, or vomiting noted at this time. 03/23/18 on evaluation today patient's wound actually appears to be showing signs of improvement which is good news. He is currently still on the Dapsone. He is also working on tapering the prednisone to get off of this and Dr. Lottie Rater is working with him in this regard. Nonetheless overall I feel like the wound is doing well it does appear based on the infectious disease note that I reviewed from Dr. 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 I did also review the results of his ultrasound of left lower extremity which revealed there was a dentist tissue in the base of the wound without an abscess noted. 04/06/18 on evaluation today the patient's left lateral lower extremity ulcer actually appears to be doing fairly well which is excellent news. There does not appear to be any evidence of infection at this time which is also great news. Overall he still does have a significantly large ulceration although little by little he seems to be making progress. He is down to 10 mg a day of the prednisone. 04/20/18 on evaluation today patient actually appears to be doing excellent at this time in regard to his left lower extremity ulcer. He's making signs of good progress unfortunately this is taking much longer than we would really like to see but nonetheless he is making progress. Fortunately there does not appear to be any evidence of infection at this time. No fevers, chills, nausea, or vomiting noted at this time. The patient has not been using the Santyl due to the cost  he hadn't got in this field yet. He's mainly been using the antibiotic ointment topically. Subsequently he also tells me that he really has not been scrubbing in the shower I think this would be helpful again as I told him it doesn't have to be anything too aggressive to even make it believe just enough to keep it free of some of the loose slough and biofilm on the wound surface. 05/11/18 on evaluation today patient's wound appears to be making slow but sure progress in regard to the left lateral lower extremity ulcer. He is been tolerating the dressing changes without complication. Fortunately there does not appear to be any evidence of infection at this time. He is still just using triple antibiotic ointment along with clobetasol occasionally over the area. He never got the Santyl and really does not seem to intend to in my pinion. 06/01/18 on evaluation today patient appears to be doing a little better in regard to his left lateral lower extremity ulcer. He states that overall he does not feel like he is doing as well with the Dapsone as he did with the prednisone. Nonetheless he sees his dermatologist later today and is gonna talk to them about the possibility of going back on the prednisone. Overall again I believe that the wound would be better if you would utilize Santyl but he really does not seem to be interested in going back to the [place] at this point. He  has been using triple antibiotic ointment. 06/15/18 on evaluation today patient's wound actually appears to be doing about the same at this point. Fortunately there is no signs of infection at this time. He has made slight improvements although he continues to not really want to clean the wound bed at this point. He states that he just doesn't mess with it he doesn't want to cause any problems with everything else he has going on. He has been on medication, antibiotics as prescribed by his dermatologist, for a staff infection of his lower  extremities which is really drying out now and looking much better he tells me. Fortunately there is no sign of overall infection. 06/29/18 on evaluation today patient appears to be doing well in regard to his left lateral lower surety ulcer all things considering. Fortunately his staff infection seems to be greatly improved compared to previous. He has no signs of infection and this is drying up quite nicely. He is still the doxycycline for this is no longer on cental, Dapsone, or any of the other medications. His dermatologist has recommended possibility of an infusion but right now he does not want to proceed with that. 07/13/18 on evaluation today patient appears to be doing about the same in regard to his left lateral lower surety ulcer. Fortunately there's no signs of infection at this time which is great news. Unfortunately he still builds up a significant amount of Slough/biofilm of the surface of the wound he still is not really cleaning this as he should be appropriately. Again I'm able to easily with saline and gauze remove the majority of this on the surface which if you would do this at home would likely be a dramatic improvement for him as far as getting the area to improve. Nonetheless overall I still feel like he is making progress is just very slow. I think Santyl will be of benefit for him as well. Still he has not gotten this as of this point. 07/27/18 on evaluation today patient actually appears to be doing little worse in regards of the erythema around the periwound region of the wound he also tells me that he's been having more drainage currently compared to what he was experiencing last time I saw him. He states not quite as bad as what he had because this was infected previously but nonetheless is still appears to be doing poorly. Fortunately there is no evidence of systemic infection at this point. The patient tells me that he is not going to be able to afford the Santyl. He is  still waiting to hear about the infusion therapy with his dermatologist. Apparently she wants an updated colonoscopy first. [name], [name] ([id number]) 08/10/18 on evaluation today patient appears to be doing better in regard to his left lateral lower extremity ulcer. Fortunately he is showing signs of improvement in this regard he's actually been approved for Remicade infusion's as well although this has not been scheduled as of yet. Fortunately there's no signs of active infection at this time in regard to the wound although he is having some issues with infection of the right lower extremity is been seen as dermatologist for this. Fortunately they are definitely still working with him trying to keep things under control. 09/07/18 on evaluation today patient is actually doing rather well in regard to his left lateral lower extremity ulcer. He notes these actually having some hair grow back on his extremity which is something he has not seen in years. He also tells  me that the pain is really not giving them any trouble at this time which is also good news overall she is very pleased with the progress he's using a combination of the mupirocin along with the probate is all mixed. 09/21/18 on evaluation today patient actually appears to be doing fairly well all things considered in regard to his looks from the ulcer. He's been tolerating the dressing changes without complication. Fortunately there's no signs of active infection at this time which is good news he is still on all antibiotics or prevention of the staff infection. He has been on prednisone for time although he states it is gonna contact his dermatologist and see if she put them on a short course due to some irritation that he has going on currently. Fortunately there's no evidence of any overall worsening this is going very slow I think cental would be something that would be helpful for him although he states that $50 for tube is  quite expensive. He therefore is not willing to get that at this point. 10/06/18 on evaluation today patient actually appears to be doing decently well in regard to his left lateral leg ulcer. He's been tolerating the dressing changes without complication. Fortunately there's no signs of active infection at this time. Overall I'm actually rather pleased with the progress he's making although it's slow he doesn't show any signs of infection and he does seem to be making some improvement. I do believe that he may need a switch up and dressings to try to help this to heal more appropriately and quickly. 10/19/18 on evaluation today patient actually appears to be doing better in regard to his left lateral lower extremity ulcer. This is shown signs of having much less Slough buildup at this point due to the fact he has been using the [employer]. Obviously this is very good news. The overall size of the wound is not dramatically smaller but again the appearance is. 11/02/18 on evaluation today patient actually appears to be doing quite well in regard to his lower [place] ulcer. A lot of the skin around the ulcer is actually somewhat irritating at this point this seems to be more due to the dressing causing irritation from the adhesive that anything else. Fortunately there is no signs of active infection at this time. 11/24/18 on evaluation today patient appears to be doing a little worse in regard to his overall appearance of his lower extremity ulcer. There's more erythema and warmth around the wound unfortunately. He is currently on doxycycline which he has been on for some time. With that being said I'm not sure that seems to be helping with what appears to possibly be an acute cellulitis with regard to his left lower extremity ulcer. No fevers, chills, nausea, or vomiting noted at this time. 12/08/18 on evaluation today patient's wounds actually appears to be doing significantly better compared to his last  evaluation. He has been using Santyl along with alternating tripling about appointment as well as the steroid cream seems to be doing quite well and the wound is showing signs of improvement which is excellent news. Fortunately there's no evidence of infection and in fact his culture came back negative with only normal skin flora noted. 12/21/2018 upon evaluation today patient actually appears to be doing excellent with regard to his ulcer. This is actually the best that I have seen it since have been helping to take care of him. It is both smaller as well as less slough noted  on the surface of the wound and seems to be showing signs of good improvement with new skin growing from the edges. He has been using just the triamcinolone he does wonder if he can get a refill of that ointment today. 01/04/2019 upon evaluation today patient actually appears to be doing well with regard to his left lateral lower extremity ulcer. With that being said it does not appear to be that he is doing quite as well as last time as far as progression is concerned. There does not appear to be any signs of infection or significant irritation which is good news. With that being said I do believe that he may benefit from switching to a collagen based dressing based on how clean The wound appears. 01/18/2019 on evaluation today patient actually appears to be doing well with regard to his wound on the left lower extremity. He is not made a lot of progress compared to where we were previous but nonetheless does seem to be doing okay at this time which is good news. There is no signs of active infection which is also good news. My only concern currently is I do wish we can get him into utilizing the collagen dressing his insurance would not pay for the supplies that we ordered although it appears that he may be able to order this through his supply company that he typically utilizes. This is [employer]. Nonetheless he did try to order  it during the office visit today and it appears this did go through. We will see if he can get that it is a different brand but nonetheless he has collagen and I do think will be beneficial. 02/01/2019 on evaluation today patient actually appears to be doing a little worse today in regard to the overall size of his wounds. Fortunately there is no signs of active infection at this time. That is visually. Nonetheless when this is happened before it was due to infection. For that reason were somewhat concerned about that this time as well. 02/08/2019 on evaluation today patient unfortunately appears to be doing slightly worse with regard to his wound upon evaluation today. Is measuring a little deeper and a little larger unfortunately. I am not really sure exactly what is causing this to enlarge he actually did see his dermatologist she is going to see about initiating Humira for him. Subsequently she also did do steroid injections into the wound itself in the periphery. Nonetheless still nonetheless he seems to be getting a little bit larger he is gone back to just using the steroid cream topically which I think is appropriate. I would say hold off on the collagen for the time being is definitely a good thing to do. Based on the culture results which we finally did get the final result back regarding it shows staph as the bacteria noted again that can be a normal skin bacteria based on the fact however he is having increased drainage and worsening of the wound measurement wise I would go ahead and place him on an antibiotic today I do believe for this. 02/15/2019 on evaluation today patient actually appears to be doing somewhat better in regard to his ulcer. There is no signs of worsening at this time I did review his culture results which showed evidence of Staphylococcus aureus but not MRSA. Again this could just be more related to the normal skin bacteria although he states the drainage has slowed down  quite a bit he may have had a mild  infection not just colonization. And was much smaller and then since around10/04/2019 on evaluation today patient appears to be doing unfortunately worse as far as the size of the wound. I really feel like that this is steadily getting larger again it had been doing excellent right at the beginning of September we have seen a steady increase in the area of the wound it is almost 2-1/2 times the size it was on September 1. Obviously this is a bad trend this is not wanting to see. For that reason we went back to using just the topical triamcinolone cream which does seem to help with inflammation. I checked him for bacteria by way of culture and nothing showed positive there. I am considering giving him a short course of a tapering steroid Horris Speros, [place] ([id number]) today to see if that is can be beneficial for him. The patient is in agreement with giving that a try. 03/08/2019 on evaluation today patient appears to be doing very well in comparison to last evaluation with regard to his lower extremity ulcer. This is showing signs of less inflammation and actually measuring slightly smaller compared to last time every other week over the past month and a half he has been measuring larger larger larger. Nonetheless I do believe that the issue has been inflammation the prednisone does seem to have been beneficial for him which is good news. No fevers, chills, nausea, vomiting, or diarrhea. 03/22/2019 on evaluation today patient appears to be doing about the same with regard to his leg ulcer. He has been tolerating the dressing changes without complication. With that being said the wound seems to be mostly arrested at its current size but really is not making any progress except for when we prescribed the prednisone. He did show some signs of dropping as far as the overall size of the wound during that interval week. Nonetheless this is something he is not on  long-term at this point and unfortunately I think he is getting need either this or else the Humira which his dermatologist has discussed try to get approval for. With that being said he will be seeing his dermatologist on the 11th of this month that is November. 04/19/2019 on evaluation today patient appears to be doing really about the same the wound is measuring slightly larger compared to last time I saw him. He has not been into the office since November 2 due to the fact that he unfortunately had Covid as that his entire family. He tells me that it was rough but they did pull-through and he seems to be doing much better. Fortunately there is no signs of active infection at this time. No fevers, chills, nausea, vomiting, or diarrhea. 05/10/2019 on evaluation today patient unfortunately appears to be doing significantly worse as compared to last time I saw him. He does tell me that he has had his first dose of Humira and actually is scheduled to get the next one in the upcoming week. With that being said he tells me also that in the past several days he has been having a lot of issues with green drainage she showed me a picture this is more blue-green in color. He is also been having issues with increased sloughy buildup and the wound does appear to be larger today. Obviously this is not the direction that we want everything to take based on the starting of his Humira. Nonetheless I think this is definitely a result of likely infection and to be honest  I think this is probably Pseudomonas causing the infection based on what I am seeing. 05/24/2019 on evaluation today patient unfortunately appears to be doing significantly worse compared to his prior evaluation with me 2 weeks ago. I did review his culture results which showed that he does have Staph aureus as well as Pseudomonas noted on the culture. Nonetheless the Levaquin that I prescribed for him does not appear to have been appropriate and in  fact he tells me he is no longer experiencing the green drainage and discharge that he had at the last visit. Fortunately there is no signs of active infection at this time which is good news although the wound has significantly worsened it in fact is much deeper than it was previous. We have been utilizing up to this point triamcinolone ointment as the prescription topical of choice but at this time I really feel like that the wound is getting need to be packed in order to appropriately manage this due to the deeper nature of the wound. Therefore something along the lines of an alginate dressing may be more appropriate. 05/31/2019 upon inspection today patient's wound actually showed signs of doing poorly at this point. Unfortunately he just does not seem to be making any good progress despite what we have tried. He actually did go ahead and pick up the Cipro and start taking that as he was noticing more green drainage he had previously completed the Levaquin that I prescribed for him as well. Nonetheless he missed his appointment for the seventh last week on Wednesday with the wound care center and [facility] where his dermatologist referred him. Obviously I do think a second opinion would be helpful at this point especially in light of the fact that the patient seems to be doing so poorly despite the fact that we have tried everything that I really know how at this point. The only thing that ever seems to have helped him in the past is when he was on high doses of continual steroids that did seem to make a difference for him. 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 06/07/2019 upon evaluation today patient unfortunately appears to be doing worse yet again with regard to his wound. In fact I am starting to question whether or not he may  have a fluid pocket in the muscle at this point based on the bulging and the soft appearance to the central portion of the muscle area. There is not anything draining from the muscle itself at this time which is good news but nonetheless the wound is expanding. I am not really seeing any results of the Humira as far as overall wound progression based on what I am seeing at this point. The patient has been referred for second opinion with regard to his wound to the [facility] wound care center by his dermatologist which I definitely am not in opposition to. Unfortunately we tried multiple dressings in the past including collagen, alginate, and at one point even Hydrofera Blue. With that being said he is never really used it for any significant amount of time due to the fact that he often complains of pain associated with these dressings and then will go back to either using the Santyl which she has done intermittently or more frequently the triamcinolone. He  is also using his own compression stockings. We have wrapped him in the past but again that was something else that he really was not a big fan of. 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 He seemed to do much better when he was on the steroids although honestly I understand that the steroids are not necessarily the best medication to be on long-term obviously 06/14/2019 on evaluation today patient appears to be doing actually a little bit better with regard to the overall appearance with his leg. Unfortunately he does continue to have issues with what appears to be some fluid underneath the muscle although he did see the wound  specialty center at [facility] last week their main goals were to see about infusion therapy in place of the Humira as they feel like that is not quite strong enough. They also recommended that we continue with the treatment otherwise as we are they felt like that was appropriate and they are okay with him continuing to follow-up here with [place] in that regard. With that being said they are also sending him to the vein specialist there to see about vein stripping and if that would be of benefit for him. Subsequently they also did not really address whether or not an ultrasound of the muscle area to see if there is anything that needs to be addressed here would be appropriate or not. For that reason I discussed this with him last week I think we may proceed down that road at this point. 06/21/2019 upon evaluation today patient's wound actually appears to be doing slightly better compared to previous evaluations. I do believe that he has made a difference with regard to the progression here with the use of oral steroids. Again in the past has been the only thing that is really calm things down. He does tell me that from [facility] is gotten a good news from there that there are no further vein stripping that is necessary at this point. I do not have that available for review today although the patient did relay this to me. He also did obtain and have the ultrasound of the wound completed which I did sign off on today. It does appear that there is no fluid collection under the muscle this is likely then just edematous tissue in general. That is also good news. Overall I still believe the inflammation is the main issue here. He did inquire about the possibility of a wound VAC again with the muscle protruding like it is I am not really sure whether the wound VAC is necessarily ideal or not. That is something we will have to consider although I do believe he may need compression wrapping to try to help with edema control which  could potentially be of benefit. 06/28/2019 on evaluation today patient appears to be doing slightly better measurement wise although this is not terribly smaller he least seems to be trending towards that direction. With that being said he still seems to have purulent drainage noted in the wound bed at this time. He has been on Levaquin followed by Cipro over the past month. Unfortunately he still seems to  have some issues with active infection at this time. I did perform a culture last week in order to evaluate and see if indeed there was still anything going on. Subsequently the culture did come back Perot, [place]et[place] ([id number]) showing Pseudomonas which is consistent with the drainage has been having which is blue-green in color. He also has had an odor that again was somewhat consistent with Pseudomonas as well. Long story short it appears that the culture showed an intermediate finding with regard to how well the Cipro will work for the Pseudomonas infection. Subsequently being that he does not seem to be clearing up and at best what we are doing is just keeping this at [place] I think he may need to see infectious disease to discuss IV antibiotic options. 07/05/2019 upon evaluation today patient appears to be doing okay in regard to his leg ulcer. He has been tolerating the dressing changes at this point without complication. Fortunately there is no signs of active infection at this time which is good news. No fevers, chills, nausea, vomiting, or diarrhea. With that being said he does have an appointment with infectious disease tomorrow and his primary care on Wednesday. Again the reason for the infectious disease referral was due to the fact that he did not seem to be fully resolving with the use of oral antibiotics and therefore we were thinking that IV antibiotic therapy may be necessary secondary to the fact that there was an intermediate finding for how effective the Cipro may be. Nonetheless again  he has been having a lot of purulent and even green drainage. Fortunately right now that seems to have calmed down over the past week with the reinitiation of the oral antibiotic. Nonetheless we will see what Dr. Megan Salon has to say. 07/12/2019 upon evaluation today patient appears to be doing about the same at this point in regard to his left lower extremity ulcer. Fortunately there is no signs of active infection at this time which is good news I do believe the Levaquin has been beneficial I did review Dr. Hale Bogus note and to be honest I agree that the patient's leg does appear to be doing better currently. What we found in the past as he does not seem to really completely resolve he will stop the antibiotic and then subsequently things will revert back to having issues with blue-green drainage, increased pain, and overall worsening in general. Obviously that is the reason I sent him back to infectious disease. 07/19/2019 upon evaluation today patient appears to be doing roughly the same in size there is really no dramatic improvement. He has started back on the Levaquin at this point and though he seems to be doing okay he did still have a lot of blue/green drainage noted on evaluation today unfortunately. I think that this is still indicative more likely of a Pseudomonas infection as previously noted and again he does see Dr. Megan Salon in just a couple of days. I do not know that were really able to effectively clear this with just oral antibiotics alone based on what I am seeing currently. Nonetheless we are still continue to try to manage as best we can with regard to the patient and his wound. I do think the wrap was helpful in decreasing the edema which is excellent news. No fevers, chills, nausea, vomiting, or diarrhea. 07/26/2019 upon evaluation today patient appears to be doing slightly better with regard to the overall appearance of the muscle there is no dark discoloration centrally.  Fortunately there is no signs of active infection at this time. No fevers, chills, nausea, vomiting, or diarrhea. Patient's wound bed currently the patient did have an appointment with Dr. Megan Salon at infectious disease last week. With that being said Dr. Megan Salon the patient states was still somewhat hesitant about put him on any IV antibiotics he wanted [place] to repeat cultures today and then see where things go going forward. He does look like Dr. Megan Salon because of some improvement the patient did have with the Levaquin wanted [place] to see about repeating cultures. If it indeed grows the Pseudomonas again then he recommended a possibility of considering a PICC line placement and IV antibiotic therapy. He plans to see the patient back in 1 to 2 weeks. 08/02/2019 upon evaluation today patient appears to be doing poorly with regard to his left lower extremity. We did get the results of his culture back it shows that he is still showing evidence of Pseudomonas which is consistent with the purulent/blue-green drainage that he has currently. Subsequently the culture also shows that he now is showing resistance to the oral fluoroquinolones which is unfortunate as that was really the only thing to treat the infection prior. I do believe that he is looking like this is going require IV antibiotic therapy to get this under control. Fortunately there is no signs of systemic infection at this time which is good news. The patient does see Dr. Megan Salon tomorrow. 08/09/2019 upon evaluation today patient appears to be doing better with regard to his left lower extremity ulcer in regard to the overall appearance. He is currently on IV antibiotic therapy. As ordered by Dr. Megan Salon. Currently the patient is on ceftazidime which she is going to take for the next 2 weeks and then follow-up for 4 to 5-week appointment with Dr. Megan Salon. The patient started this this past Friday symptoms have not for a total of 3 days  currently in full. 08/16/2019 upon evaluation today patient's wound actually does show muscle in the base of the wound but in general does appear to be much better as far as the overall evidence of infection is concerned. In fact I feel like this is for the most part cleared up he still on the IV antibiotics he has not completed the full course yet but I think he is doing much better which is excellent news. 08/23/2019 upon evaluation today patient appears to be doing about the same with regard to his wound at this point. He tells me that he still has pain unfortunately. Fortunately there is no evidence of systemic infection at this time which is great news. There is significant muscle protrusion. 09/13/19 upon evaluation today patient appears to be doing about the same in regard to his leg unfortunately. He still has a lot of drainage coming from the ulceration there is still muscle exposed. With that being said the patient's last wound culture still showed an intermediate finding with regard to the Pseudomonas he still having the bluish/green drainage as well. Overall I do not know that the wound has completely cleared of infection at this point. Fortunately there is no signs of active infection systemically at this point which is good news. 09/20/2019 upon evaluation today patient's wound actually appears to be doing about the same based on what I am seeing currently. I do not see any signs of systemic infection he still does have evidence of some local infection and drainage. He did see Dr. Megan Salon last week and Dr. Megan Salon states  that he probably does need a different IV antibiotic although he does not want to put him on this until the patient begins the Remicade infusion which is actually scheduled for about 10 days out from today on 13 May. Following that time Dr. Megan Salon is good to see him back and then will evaluate the feasibility of starting him on the IV antibiotic therapy once again at that  point. I do not disagree with this plan I do believe as Dr. Megan Salon stated in his note that I reviewed today that the patient's issue is multifactorial with the pyoderma being 1 aspect of this that were hoping the Remicade will be helpful for her. In the meantime I think the gentamicin is, helping to keep things under decent okay control in regard to the ulcer. 09/27/2019 upon evaluation today patient appears to be doing about the same with regard to his wound still there is a lot of muscle exposure though he does have some hyper granulation tissue noted around the edge and actually some granulation tissue starting to form over the muscle which is actually good news. Fortunately there is no evidence of active infection which is also good news. His pain is less at this point. 5/21; this is a patient I have not seen in a long time. 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 He has a large wound on the left lateral leg with protruding muscle. He comes in the clinic today showing the same area on his left medial ankle. He says there is been a spot there for some time although we have not previously defined this. Today he has a clearly defined area with slight amount of skin breakdown surrounded by raised areas with a purplish hue in color. This is not painful he says it is irritated. 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 He still has muscle protruding from the lateral portion of his left leg but fortunately the new area that they were concerned about at his last visit does not appear to have opened at this point. He is currently on Remicade infusions and seems to be doing better in my opinion in fact the wound itself seems to be overall much better. The purplish discoloration that he did have seems to have resolved and I think that is a good sign that  hopefully the Remicade is doing its job. He does Brindle, Lazarus ([id number]) have some biofilm noted over the surface of the wound. 11/01/2019 on evaluation today patient's wound actually appears to be doing excellent at this time. Fortunately there is no evidence of active infection and overall I feel like he is making great progress. The Remicade seems to be due excellent job in my opinion. 11/08/19 evaluation today vision actually appears to be doing quite well with regard to his weight ulcer. He's been tolerating dressing changes without complication. Fortunately there is no evidence of infection. No fevers, chills, nausea, or vomiting noted at this time. Overall states that is having more itching than pain which is actually a good sign in my opinion. 12/13/2019 upon evaluation today patient appears to be doing well today with regard to his wound. He has been tolerating the dressing changes without complication. Fortunately there is no sign of active infection at this time. No fevers, chills, nausea, vomiting, or diarrhea. Overall I feel like the infusion therapy has been very beneficial for him. 01/06/2020 on  evaluation today patient appears to be doing well with regard to his wound. This is measuring smaller and actually looks to be doing better. Fortunately there is no signs of active infection at this point. No fevers, chills, nausea, vomiting, or diarrhea. With that being said he does still have the blue-green drainage but this does not seem to be causing any significant issues currently. He has been using the gentamicin that does seem to be keeping things under decent control at this point. He goes later this morning for his next infusion therapy for the pyoderma which seems to also be very beneficial. 02/07/2020 on evaluation today patient appears to be doing about the same in regard to his wounds currently. Fortunately there is no signs of active infection systemically he does still have  evidence of local infection still using gentamicin. He also is showing some signs of improvement albeit slowly I do feel like we are making some progress here. 02/21/2020 upon evaluation today patient appears to be making some signs of improvement the wound is measuring a little bit smaller which is great news and overall I am very pleased with where he stands currently. He is going to be having infusion therapy treatment on the 15th of this month. Fortunately there is no signs of active infection at this time. 03/13/2020 I do believe patient's wound is actually showing some signs of improvement here which is great news. He has continue with the infusion therapy through rheumatology/dermatology at [facility]. That does seem to be beneficial. I still think he gets as much benefit from this as he did from the prednisone initially but nonetheless obviously this is less harsh on his body that the prednisone as far as they are concerned. 03/31/2020 on evaluation today patient's wound actually showing signs of some pretty good improvement in regard to the overall appearance of the wound bed. There is still muscle exposed though he does have some epithelial growth around the edges of the wound. Fortunately there is no signs of active infection at this time. No fevers, chills, nausea, vomiting, or diarrhea. 04/24/2020 upon evaluation today patient appears to be doing about the same in regard to his leg ulcer. He has been tolerating the dressing changes without complication. Fortunately there is no signs of active infection at this time. No fevers, chills, nausea, vomiting, or diarrhea. With that being said he still has a lot of irritation from the bandaging around the edges of the wound. We did discuss today the possibility of a referral to plastic surgery. 05/22/2020 on evaluation today patient appears to be doing well with regard to his wounds all things considered. He has not been able to get the Chantix apparently  there is a recall nurse that I was unaware of put out by [employer] involuntarily. Nonetheless for now I am and I have to do some research into what may be the best option for him to help with quitting in regard to smoking and we discussed that today. 06/26/2020 upon evaluation today patient appears to be doing well with regard to his wound from the standpoint of infection I do not see any signs of infection at this point. With that being said unfortunately he is still continuing to have issues with muscle exposure and again he is not having a whole lot of new skin growth unfortunately. There does not appear to be any signs of active infection at this time. No fevers, chills, nausea, vomiting, or diarrhea. 07/10/2020 upon evaluation today patient appears to be  doing a little bit more poorly currently compared to where he was previous. I am concerned currently about an active infection that may be getting worse especially in light of the increased size and tenderness of the wound bed. No fevers, chills, nausea, vomiting, or diarrhea. 07/24/2020 upon evaluation today patient appears to be doing poorly in regard to his leg ulcer. He has been tolerating the dressing changes without complication but unfortunately is having a lot of discomfort. 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 Subsequently I think he is going require possibly IV antibiotics to get this under control. I am very concerned about the severity of his infection and the amount of discomfort he is having. 07/31/2020 upon evaluation today patient appears to be doing about the same in regard to his leg wound. He did see Dr. Megan Salon and Dr. Megan Salon is actually going to start him on IV antibiotics. He goes for the PICC line tomorrow. With that being said there do not have that run for 2 weeks and then see how things are doing and depending on how he is progressing they may extend  that a little longer. Nonetheless I am glad this is getting ready to be in place and definitely feel it may help the patient. In the meantime is been using mainly triamcinolone to the wound bed has an anti-inflammatory. 08/07/2020 on evaluation today patient appears to be doing well with regard to his wound compared even last week. In the interim he has gotten the PICC line placed and overall this seems to be doing excellent. There does not appear to be any evidence of infection which is great news systemically although locally of course has had the infection this appears to be improving with the use of the antibiotics. 08/14/2020 upon evaluation today patient's wound actually showing signs of excellent improvement. Overall the irritation has significantly improved the drainage is back down to more of a normal level and his pain is really pretty much nonexistent compared to what it was. Obviously I think that this is significantly improved secondary to the IV antibiotic therapy which has made all the difference in the world. Again he had a resistant form of Pseudomonas for which oral antibiotics just was not cutting it. Nonetheless I do think that still we need to consider the possibility of a surgical closure for this wound is been open so long and to be honest with muscle exposed I think this can be very hard to get this to close outside of this although definitely were still working to try to do what we can in that regard. 08/21/2020 upon evaluation today patient appears to be doing very well with regard to his wounds on the left lateral lower extremity/calf area. Fortunately there does not appear to be signs of active infection which is great news and overall very pleased with where things stand today. He is actually wrapping up his treatment with IV antibiotics tomorrow. After that we will see where things go from there. 08/28/2020 upon evaluation today patient appears to be doing decently well with  regard to his leg ulcer. There does not appear to be any signs of [name], [name] ([id number]) active infection which is great news and overall very pleased with where things stand today. No fevers, chills, nausea, vomiting, or diarrhea. 09/18/2020 upon evaluation today patient appears to be doing well with regard to his infection which I feel like is better. Unfortunately he is not doing as  well with regard to the overall size of the wound which is not nearly as good at this point. I feel like that he may be having an issue here with the pyoderma being somewhat out of control. I think that he may benefit from potentially going back and talking to the dermatologist about what to do from the pyoderma standpoint. I am not certain if the infusions are helping nearly as much is what the prednisone did in the past. 10/02/2020 upon evaluation today patient appears to be doing well with regard to his leg ulcer. He did go to the Psychiatric nurse. Unfortunately they feel like there is a 10% chance that most that he would be able to heal and that the skin graft would take. Obviously this has led him to not be able to go down that path as far as treatment is concerned. Nonetheless he does seem to be doing a little bit better with the prednisone that I gave him last time. I think that he may need to discuss with dermatology the possibility of long-term prednisone as that seems to be what is most helpful for him to be perfectly honest. I am not sure the Remicade is really doing the job. 10/17/2020 upon evaluation today patient appears to be doing a little better in regard to his wound. In fact the case has been since we did the prednisone on May 2 for him that we have noticed a little bit of improvement each time we have seen a size wise as well as appearance wise as well as pain wise. I think the prednisone has had a greater effect then the infusion therapy has to be perfectly honest. With that being said the patient  also feels significantly better compared to what he was previous. All of this is good news but nonetheless I am still concerned about the fact that again we are really not set up to long-term manage him as far as prednisone is concerned. Obviously there are things that you need to be watched I completely understand the risk of prednisone usage as well. 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 With all that being said I do believe that we can give him another round of the prednisone which she is requesting today because of the improvement that he seen since we did that first round. 10/30/2020 upon evaluation today patient's wound actually is showing signs of doing quite well. There does not appear to be any evidence of infection which is great news and overall very pleased with where things stand today. No fevers, chills, nausea, vomiting, or diarrhea. He tells me that the prednisone still has seem to have helped he wonders if we can extend that for just a little bit longer. He did not have the appointment with a dermatologist although he did have an infusion appointment last Friday. That was at [facility]. With that being said he tells me he could not do both that as well as the appointment with the physician on the same day therefore that is can have to be rescheduled. I really want to see if there is anything they feel like that could be done differently to try to help this out as I am not really certain that the infusions are helping significantly here. 11/13/2020 upon evaluation today patient unfortunately appears to be doing somewhat poorly in regard to his wound I feel like this is actually worsening from the standpoint of the  pyoderma spreading. I still feel like that he may need something different as far as trying to manage this going forward. Again we did the prednisone unfortunately his blood sugars are not doing so well because of this. Nonetheless I  believe that the patient likely needs to try topical steroid. We have done triamcinolone for a while I think going with something stronger such as clobetasol could be beneficial again this is not something I do lightly I discussed this with the patient that again this does not normally put underneath an occlusive dressing. Nonetheless I think a thin film as such could help with some of the stronger anti-inflammatory effects. We discussed this today. He would like to try to give this a trial for the next couple weeks. I definitely think that is something that we can do. Evaluate7/03/2021 and today patient's wound bed actually showed signs of doing really about the same. There was a little expansion of the size of the wound and that leading edge that we done looking out although the clobetasol does seem to have slowed this down a bit in my opinion. There is just 1 small area that still seems to be progressing based on what I see. Nonetheless I am concerned about the fact this does not seem to be improving if anything seems to be doing a little bit worse. I do not know that the infusions are really helping him much as next infusion is August 5 his appointment with dermatology is July 25. Either way I really think that we need to have a conversation potentially about this and I am actually going to see if I can talk with Dr. Lillia Carmel in order to see where things stand as well. 12/11/2020 upon evaluation today patient appears to be doing worse in regard to his leg ulcer. Unfortunately I just do not think this is making the progress that I would like to see at this point. Honestly he does have an appointment with dermatology and this is in 2 days. I am wondering what they may have to offer to help with this. Right now what I am seeing is that he is continuing to show signs of worsening little by little. Obviously that is not great at all. Is the exact opposite of what we are looking for. 12/18/2020 upon  evaluation today patient appears to be doing a little better in regard to his wound. The dermatologist actually did do some steroid injections into the wound which does seem to have been beneficial in my opinion. That was on the 25th already this looks a little better to me than last time I saw him. With that being said we did do a culture and this did show that he has Staph aureus noted in abundance in the wound. With that being said I do think that getting him on an oral antibiotic would be appropriate as well. Also think we can compression wrap and this will make a difference as well. Objective Constitutional Well-nourished and well-hydrated in no acute distress. Vitals Time Taken: 8:26 AM, Height: 71 in, Weight: 338 lbs, BMI: 47.1, Temperature: 98.3 F, Pulse: 83 bpm, Respiratory Rate: 16 breaths/min, Blood Pressure: 133/83 mmHg. Respiratory normal breathing without difficulty. Psychiatric Carey ([id number]) this patient is able to make decisions and demonstrates good insight into disease process. Alert and Oriented x 3. pleasant and cooperative. General Notes: Upon inspection patient's wound bed actually showed signs of good granulation and epithelization at this point. That is in some areas around  the edges where to be honest this has appeared to be very inflamed over the past several weeks this is significantly better compared to before and I think this is attributed to the steroid injections to be honest. His dermatologist is also considering upping some of his infusion medications or else to be honest trying to do a different steroid to see if that would be better for him. Either way I think something needs to be done here as it does seem to be that this response of the steroids much more effectively than anything else we have been doing up to this point. I am also going to get him started on antibiotics today. Integumentary (Hair, Skin) Wound #1 status is Open. Original cause  of wound was Gradually Appeared. The date acquired was: 11/18/2015. The wound has been in treatment 210 weeks. The wound is located on the Left,Lateral Lower Leg. The wound measures 6.7cm length x 7.8cm width x 0.7cm depth; 41.045cm^2 area and 28.731cm^3 volume. There is muscle and Fat Layer (Subcutaneous Tissue) exposed. There is undermining starting at 12:00 and ending at 8:00 with a maximum distance of 1.5cm. There is a large amount of purulent drainage noted. The wound margin is epibole. There is large (67- 100%) red, pink granulation within the wound bed. There is a small (1-33%) amount of necrotic tissue within the wound bed including Adherent Slough. 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 Bathing/ Shower/ Hygiene: Clean wound with Normal Saline or wound cleanser. Edema Control - Lymphedema / Segmental Compressive Device / Other: Optional: One layer of unna paste to top of compression wrap (to act as an anchor). 4 Layer Compression System Lymphedema. Other: - Call [facility] for knee high compression socks 20/101m/Hg Medications-Please add to medication list.: P.O. Antibiotics The following medication(s) was prescribed: Bactrim DS oral 800 mg-160 mg tablet 1 1 tablet oral taken 2 times per day for 14 days starting 12/18/2020 WOUND #1: - Lower Leg Wound Laterality: Left, Lateral Cleanser: Soap and Water Every Other Day/30 Days Discharge Instructions: Gently cleanse wound with antibacterial soap, rinse and pat dry prior to dressing wounds Topical: Triamcinolone Acetonide Cream, 0.1%, 15 (g) tube Every Other Day/30 Days Discharge  Instructions: Apply in office only to 7 o'clock area Primary Dressing: Aquacel Extra Hydrofiber Dressing, 4x5 (in/in) Every Other Day/30 Days Secondary Dressing: Xtrasorb Medium 4x5 (in/in) Every Other Day/30 Days Discharge Instructions: Apply to wound as directed. Do not cut. Compression Wrap: Medichoice 4 layer Compression System, 35-40 mmHG (Generic) Every Other Day/30 Days Discharge Instructions: Apply multi-layer wrap as directed. 1. Would recommend currently that we go ahead and initiate treatment with the Bactrim which I think is can be the best option for him currently and the patient is in agreement with plan. Fortunately we did not identify Pseudomonas as a bacteria today. Therefore we do have some oral options here for antibiotics. I will actually send in a prescription for this today. 2. Also can recommend that we continue with the triamcinolone to the wound bed. I do believe that he would actually benefit from using the compression wrap as well we discussed that again today. 3. I am going go ahead and see about initiating treatment with a 4-layer compression wrap coupled with the  use of plain alginate as well as XtraSorb to help with The Excessive Drainage. The Patient Is in Agreement with That Plan. He Has Not Gotten Any of the Dual Layer Compression for 30 to 40 mmHg Compression from [facility] yet. Therefore I Think This Is Probably the Best Way to Go. DRESHAUN, STENE ([id number]) We will see patient back for reevaluation in 1 week here in the clinic. If anything worsens or changes patient will contact our office for additional recommendations. Electronic Signature(s) Signed: 12/18/2020 9:17:55 AM By: Worthy Keeler PA-C Entered By: Worthy Keeler on 12/18/2020 09:17:55 Hannan, [name] ([id number]) -------------------------------------------------------------------------------- SuperBill Details Patient Name: [name] Date of Service: 12/18/2020 Medical Record Number:  [id number] Patient Account Number: [id number] Date of Birth/Sex: 1981-05-12 (40 y.o. 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 I87.2 Venous insufficiency (chronic) (peripheral) L03.116 Cellulitis of left lower limb F17.208 Nicotine dependence, unspecified, with other nicotine-induced disorders Facility Procedures CPT4 Code: [id number] Description: (Facility Use Only) [phone] - APPLY MULTLAY COMPRS LWR LT LEG Modifier: Quantity: 1 Physician Procedures CPT4 Code: 4604799 Description: 87215 - WC PHYS LEVEL 4 - EST PT Modifier: Quantity: 1 CPT4 Code: Description: ICD-10 Diagnosis Description L97.222 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

## 2020-12-20 DIAGNOSIS — R7989 Other specified abnormal findings of blood chemistry: Secondary | ICD-10-CM

## 2020-12-20 NOTE — Unmapped (Addendum)
Faxed Avsola orders and latest clinic notes to [facility] Attn. Roderic Palau at fax # [phone].

## 2020-12-20 NOTE — Unmapped (Signed)
Reviewed labs with patient - he will check in with his pcp about his persistently but mildly elevated LFTs    Plan for his PG:  -will increase his avsola to 10mg /kg every 6 weeks (from 5 mg/kg every 8 weeks which was what was being administered)  -patient improves with ILK - he will message if he needs it and I can overbook him  -cont to follow with ID and wound - started doxycycline recently due to staph.

## 2020-12-20 NOTE — Unmapped (Signed)
Please fax updated avsola form - thank you!!

## 2020-12-20 NOTE — Telephone Encounter (Signed)
Patient has appointment for f/u on 02/28/2021 with you. Do you need to move follow up to sooner or have labs re checked?

## 2020-12-21 ENCOUNTER — Other Ambulatory Visit: Payer: Self-pay

## 2020-12-21 DIAGNOSIS — L97222 Non-pressure chronic ulcer of left calf with fat layer exposed: Secondary | ICD-10-CM | POA: Diagnosis not present

## 2020-12-21 NOTE — Progress Notes (Signed)
[name] ([id number]) Visit Report for 12/18/2020 Arrival Information Details Patient Name: [name], [name] Date of Service: 12/18/2020 8:15 AM Medical Record Number: [id number] Patient Account Number: [id number] Date of Birth/Sex: 10-14-1980 (40 y.o. M) Treating RN: [name] Primary Care Carles Florea: [name] Other Clinician: Referring Ignazio Kincaid: [name] Treating Culver Feighner/Extender: [name] Cliche in Treatment: 210 Visit Information History Since Last Visit Added or deleted any medications: No Patient Arrived: Ambulatory Has Dressing in Place as Prescribed: Yes Arrival Time: 08:26 Has Compression in Place as Prescribed: Yes Accompanied By: self Pain Present Now: No Transfer Assistance: None Patient Identification Verified: Yes Secondary Verification Process Completed: Yes Patient Requires Transmission-Based No Precautions: Patient Has Alerts: Yes Patient Alerts: Patient has reaction to silver dressings. Electronic Signature(s) Signed: 12/20/2020 5:43:31 PM By: [name], [name], RN, [name], [name], [name] Entered By: [name], [name], RN, [name], [name] on 12/18/2020 09:18:56 [name], CAZEAU ([id number]) -------------------------------------------------------------------------------- Encounter Discharge Information Details Patient Name: [name] Date of Service: 12/18/2020 8:15 AM Medical Record Number: [id number] Patient Account Number: [id number] Date of Birth/Sex: 1981/05/16 (39 y.o. M) Treating RN: [name] Primary Care Laurin Paulo: [name] Other Clinician: Referring Krisi Azua: [name] Treating Vyron Fronczak/Extender: [name] Cliche in Treatment: 210 Encounter Discharge Information Items Discharge Condition: Stable Ambulatory Status: Ambulatory Discharge Destination: Home Transportation: Private Auto Accompanied By: self Schedule Follow-up Appointment: Yes Clinical Summary of Care: Electronic Signature(s) Signed: 12/20/2020 5:43:31 PM By: [name], [name], RN, [name], [name]  RN, [name] Entered By: [name], [name], RN, [name], [name] on 12/18/2020 09:17:59 [name] ([id number]) -------------------------------------------------------------------------------- Lower Extremity Assessment Details Patient Name: [name] Date of Service: 12/18/2020 8:15 AM Medical Record Number: [id number] Patient Account Number: [id number] Date of Birth/Sex: 21-Jul-1980 (39 y.o. M) Treating RN: [name] Primary Care [name]: [name] Other Clinician: Referring Ellina Sivertsen: [name] Treating Calvyn Kurtzman/Extender: [name] Cliche in Treatment: 210 Edema Assessment Assessed: [Left: No] [Right: No] [Left: Edema] [Right: :] Calf Left: Right: Point of Measurement: 33 cm From Medial Instep 48 cm Ankle Left: Right: Point of Measurement: 12 cm From Medial Instep 30.5 cm Vascular Assessment Pulses: Dorsalis Pedis Palpable: [Left:Yes] Electronic Signature(s) Signed: 12/20/2020 5:43:31 PM By: [name], [name], RN, [name], [name], [name] Entered By: [name], [name], RN, [name], [name] on 12/18/2020 08:32:06 [name] ([id number]) -------------------------------------------------------------------------------- Multi Wound Chart Details Patient Name: [name] Date of Service: 12/18/2020 8:15 AM Medical Record Number: [id number] Patient Account Number: [id number] Date of Birth/Sex: 11/08/1980 (39 y.o. M) Treating RN: [name] Primary Care [name]: [name] Other Clinician: Referring [name]: [name] Treating [name]/Extender: [name] Cliche in Treatment: 210 Vital Signs Height(in): 71 Pulse(bpm): 61 Weight(lbs): 338 Blood Pressure(mmHg): 133/83 Body Mass Index(BMI): 47 Temperature(F): 98.3 Respiratory Rate(breaths/min): 16 Photos: [N/A:N/A] Wound Location: Left, Lateral Lower Leg N/A N/A Wounding Event: Gradually Appeared N/A N/A Primary Etiology: Pyoderma N/A N/A Comorbid History: Sleep Apnea, Hypertension, Colitis N/A N/A Date Acquired: 11/18/2015 N/A N/A Weeks  of Treatment: 210 N/A N/A Wound Status: Open N/A N/A Measurements L x W x D (cm) 6.7x7.8x0.7 N/A N/A Area (cm) : 41.045 N/A N/A Volume (cm) : 28.731 N/A N/A % Reduction in Area: -736.10% N/A N/A % Reduction in Volume: -631.60% N/A N/A Starting Position 1 (o'clock): 12 Ending Position 1 (o'clock): 8 Maximum Distance 1 (cm): 1.5 Undermining: Yes N/A N/A Classification: Full Thickness With Exposed N/A N/A Support Structures Exudate Amount: Large N/A N/A Exudate Type: Purulent N/A N/A Exudate Color: yellow, brown, green N/A N/A Wound Margin: Epibole N/A N/A Granulation Amount: Large (67-100%) N/A N/A Granulation Quality: Red, Pink  N/A N/A Necrotic Amount: Small (1-33%) N/A N/A Exposed Structures: Fat Layer (Subcutaneous Tissue): N/A N/A Yes Muscle: Yes Fascia: No Tendon: No Joint: No Bone: No Epithelialization: Small (1-33%) N/A N/A Treatment Notes Electronic Signature(s) Signed: 12/20/2020 5:43:31 PM By: [name], [name], RN, [name], [name], [name] Entered By: [name], [name], RN, [name], [name] on 12/18/2020 08:59:17 JADRIAN, BULMAN ([id number]) DEVEAN, SKOCZYLAS ([id number]) -------------------------------------------------------------------------------- Multi-Disciplinary Care Plan Details Patient Name: [name] Date of Service: 12/18/2020 8:15 AM Medical Record Number: [id number] Patient Account Number: [id number] Date of Birth/Sex: [birth year] (39 y.o. M) Treating RN: [name] Primary Care [name]: [name] Other Clinician: Referring [name]: [name] Treating [name]/Extender: [name] Cliche in Treatment: 210 oo Multidisciplinary Care Plan reviewed with physician Active Inactive Electronic Signature(s) Signed: 12/20/2020 5:43:31 PM By: [name], [name], RN, [name], [name], [name] Entered By: [name], [name], RN, [name], [name] on 12/18/2020 08:59:07 [name] ([id number]) -------------------------------------------------------------------------------- Pain Assessment Details Patient Name:  [name] Date of Service: 12/18/2020 8:15 AM Medical Record Number: [id number] Patient Account Number: [id number] Date of Birth/Sex: 1981-01-28 (39 y.o. M) Treating RN: [name] Primary Care Cortlyn Cannell: [name] Other Clinician: Referring Owynn Mosqueda: [name] Treating Teena Mangus/Extender: [name] Cliche in Treatment: 210 Active Problems Location of Pain Severity and Description of Pain Patient Has Paino Yes Site Locations Pain Location: Pain in Ulcers Rate the pain. Current Pain Level: 1 Pain Management and Medication Current Pain Management: Electronic Signature(s) Signed: 12/20/2020 5:43:31 PM By: [name], [name], RN, [name], [name], [name] Entered By: [name], [name], RN, [name], [name] on 12/18/2020 08:27:11 KRISTAN, VOTTA ([id number]) -------------------------------------------------------------------------------- Patient/Caregiver Education Details Patient Name: [name] Date of Service: 12/18/2020 8:15 AM Medical Record Number: [id number] Patient Account Number: [id number] Date of Birth/Gender: Jul 07, 1980 (39 y.o. M) Treating RN: [name] Primary Care Physician: [name] Other Clinician: Referring Physician: [name] Treating Physician/Extender: [name] Cliche in Treatment: 210 Education Assessment Education Provided To: Patient Education Topics Provided Venous: Handouts: Controlling Swelling with Compression Stockings Methods: Demonstration, Explain/Verbal Responses: State content correctly Electronic Signature(s) Signed: 12/20/2020 5:43:31 PM By: [name], [name], RN, [name], [name], [name] Entered By: [name], [name], RN, [name], [name] on 12/18/2020 09:17:23 [name] ([id number]) -------------------------------------------------------------------------------- Wound Assessment Details Patient Name: [name] Date of Service: 12/18/2020 8:15 AM Medical Record Number: [id number] Patient Account Number: [id number] Date of Birth/Sex: 07/24/1980 (39 y.o. M) Treating RN:  [name] Primary Care Lunden Stieber: [name] Other Clinician: Referring Dura Mccormack: [name] Treating Malachi Kinzler/Extender: [name] Cliche in Treatment: 210 Wound Status Wound Number: 1 Primary Etiology: Pyoderma Wound Location: Left, Lateral Lower Leg Wound Status: Open Wounding Event: Gradually Appeared Comorbid History: Sleep Apnea, Hypertension, Colitis Date Acquired: 11/18/2015 Weeks Of Treatment: 210 Clustered Wound: No Photos Photo Uploaded By: [name], [name], RN, [name], [name] on 12/18/2020 08:50:19 Wound Measurements Length: (cm) 6.7 Width: (cm) 7.8 Depth: (cm) 0.7 Area: (cm) 41.045 Volume: (cm) 28.731 % Reduction in Area: -736.1% % Reduction in Volume: -631.6% Epithelialization: Small (1-33%) Undermining: Yes Starting Position (o'clock): 12 Ending Position (o'clock): 8 Maximum Distance: (cm) 1.5 Wound Description Classification: Full Thickness With Exposed Support Structures Wound Margin: Epibole Exudate Amount: Large Exudate Type: Purulent Exudate Color: yellow, brown, green Foul Odor After Cleansing: No Slough/Fibrino Yes Wound Bed Granulation Amount: Large (67-100%) Exposed Structure Granulation Quality: Red, Pink Fascia Exposed: No Necrotic Amount: Small (1-33%) Fat Layer (Subcutaneous Tissue) Exposed: Yes Necrotic Quality: Adherent Slough Tendon Exposed: No Muscle Exposed: Yes Necrosis of Muscle: No Joint Exposed: No Bone Exposed: No Treatment Notes Wound #1 (Lower Leg) Wound Laterality: Left,  Lateral Saltz, Davied ([id number]) Cleanser Soap and Water Discharge Instruction: Gently cleanse wound with antibacterial soap, rinse and pat dry prior to dressing wounds Peri-Wound Care Topical Triamcinolone Acetonide Cream, 0.1%, 15 (g) tube Discharge Instruction: Apply in office only to 7 o'clock area Primary Dressing Aquacel Extra Hydrofiber Dressing, 4x5 (in/in) Secondary Dressing Xtrasorb Medium 4x5 (in/in) Discharge Instruction: Apply to  wound as directed. Do not cut. Secured With Compression Wrap Medichoice 4 layer Compression System, 35-40 mmHG Discharge Instruction: Apply multi-layer wrap as directed. Compression Stockings Environmental education officer) Signed: 12/20/2020 5:43:31 PM By: [name], [name], RN, [name], [name], [name] Entered By: [name], [name], RN, [name], [name] on 12/18/2020 08:31:06 WALI, REINHEIMER ([id number]) -------------------------------------------------------------------------------- Vitals Details Patient Name: [name] Date of Service: 12/18/2020 8:15 AM Medical Record Number: [id number] Patient Account Number: [id number] Date of Birth/Sex: 1980-06-11 (39 y.o. M) Treating RN: [name] Primary Care Kyro Joswick: [name] Other Clinician: Referring Shermaine Rivet: [name] Treating Leola Fiore/Extender: [name] Cliche in Treatment: 210 Vital Signs Time Taken: 08:26 Temperature (F): 98.3 Height (in): 71 Pulse (bpm): 83 Weight (lbs): 338 Respiratory Rate (breaths/min): 16 Body Mass Index (BMI): 47.1 Blood Pressure (mmHg): 133/83 Reference Range: 80 - 120 mg / dl Electronic Signature(s) Signed: 12/20/2020 5:43:31 PM By: [name], [name], RN, [name], [name], [name] Entered By: [name], [name], RN, [name], [name] on 12/18/2020 08:26:56

## 2020-12-22 NOTE — Progress Notes (Signed)
ROWDY, GUERRINI ([id number]) Visit Report for 12/21/2020 Arrival Information Details Patient Name: [name], [name] Date of Service: 12/21/2020 3:45 PM Medical Record Number: [id number] Patient Account Number: [id number] Date of Birth/Sex: [birth year] (40 y.o. M) Treating RN: Donnamarie Poag Primary Care Krisandra Bueno: [name] Other Clinician: Referring Venba Zenner: [name] Treating Latanja Lehenbauer/Extender: [name] Cliche in Treatment: 24 Visit Information History Since Last Visit Added or deleted any medications: No Patient Arrived: Ambulatory Had a fall or experienced change in No Arrival Time: 16:02 activities of daily living that may affect Accompanied By: self risk of falls: Transfer Assistance: None Hospitalized since last visit: No Patient Requires Transmission-Based No Has Dressing in Place as Prescribed: Yes Precautions: Has Compression in Place as Prescribed: Yes Patient Has Alerts: Yes Pain Present Now: No Patient Alerts: Patient has reaction to silver dressings. Electronic Signature(s) Signed: 12/22/2020 9:13:37 AM By: Donnamarie Poag Entered By: Donnamarie Poag on 12/21/2020 16:02:25 [name] ([id number]) -------------------------------------------------------------------------------- Clinic Level of Care Assessment Details Patient Name: [name] Date of Service: 12/21/2020 3:45 PM Medical Record Number: [id number] Patient Account Number: [id number] Date of Birth/Sex: March 29, 1981 (40 y.o. M) Treating RN: Donnamarie Poag Primary Care Mustaf Antonacci: [name] Other Clinician: Referring Jordyne Poehlman: [name] Treating Tehya Leath/Extender: [name] Cliche in Treatment: 211 Clinic Level of Care Assessment Items TOOL 1 Quantity Score []  - Use when EandM and Procedure is performed on INITIAL visit 0 ASSESSMENTS - Nursing Assessment / Reassessment []  - General Physical Exam (combine w/ comprehensive assessment (listed just below) when performed on new 0 pt. evals) []  -  0 Comprehensive Assessment (HX, ROS, Risk Assessments, Wounds Hx, etc.) ASSESSMENTS - Wound and Skin Assessment / Reassessment []  - Dermatologic / Skin Assessment (not related to wound area) 0 ASSESSMENTS - Ostomy and/or Continence Assessment and Care []  - Incontinence Assessment and Management 0 []  - 0 Ostomy Care Assessment and Management (repouching, etc.) PROCESS - Coordination of Care []  - Simple Patient / Family Education for ongoing care 0 []  - 0 Complex (extensive) Patient / Family Education for ongoing care []  - 0 Staff obtains Programmer, systems, Records, Test Results / Process Orders []  - 0 Staff telephones HHA, Nursing Homes / Clarify orders / etc []  - 0 Routine Transfer to another Facility (non-emergent condition) []  - 0 Routine Hospital Admission (non-emergent condition) []  - 0 New Admissions / Biomedical engineer / Ordering NPWT, Apligraf, etc. []  - 0 Emergency Hospital Admission (emergent condition) PROCESS - Special Needs []  - Pediatric / Minor Patient Management 0 []  - 0 Isolation Patient Management []  - 0 Hearing / Language / Visual special needs []  - 0 Assessment of Community assistance (transportation, D/C planning, etc.) []  - 0 Additional assistance / Altered mentation []  - 0 Support Surface(s) Assessment (bed, cushion, seat, etc.) INTERVENTIONS - Miscellaneous []  - External ear exam 0 []  - 0 Patient Transfer (multiple staff / Civil Service fast streamer / Similar devices) []  - 0 Simple Staple / Suture removal (25 or less) []  - 0 Complex Staple / Suture removal (26 or more) []  - 0 Hypo/Hyperglycemic Management (do not check if billed separately) []  - 0 Ankle / Brachial Index (ABI) - do not check if billed separately Has the patient been seen at the hospital within the last three years: Yes Total Score: 0 Level Of Care: ____ [name] ([id number]) Electronic Signature(s) Signed: 12/22/2020 9:13:37 AM By: Donnamarie Poag Entered By: Donnamarie Poag on 12/21/2020  [place] ([id number]) -------------------------------------------------------------------------------- Compression Therapy Details Patient Name: [name] Date of Service: 12/21/2020 3:45 PM Medical Record Number:  [id number] Patient Account Number: [id number] Date of Birth/Sex: July 21, 1980 (40 y.o. M) Treating RN: Donnamarie Poag Primary Care Austynn Pridmore: [name] Other Clinician: Referring Nadeem Romanoski: [name] Treating Clary Boulais/Extender: [name] Cliche in Treatment: 211 Compression Therapy Performed for Wound Assessment: Wound #1 Left,Lateral Lower Leg Performed By: Junius Argyle, RN Compression Type: Four Layer Electronic Signature(s) Signed: 12/22/2020 9:13:37 AM By: Donnamarie Poag Entered By: Donnamarie Poag on 12/21/2020 [place], [place] ([id number]) -------------------------------------------------------------------------------- Encounter Discharge Information Details Patient Name: [name] Date of Service: 12/21/2020 3:45 PM Medical Record Number: [id number] Patient Account Number: [id number] Date of Birth/Sex: 02-15-1981 (39 y.o. M) Treating RN: Donnamarie Poag Primary Care Elzie Sheets: [name] Other Clinician: Referring Rolin Schult: [name] Treating Eula Mazzola/Extender: [name] Cliche in Treatment: 211 Encounter Discharge Information Items Discharge Condition: Stable Ambulatory Status: Ambulatory Discharge Destination: Home Transportation: Private Auto Accompanied By: self Schedule Follow-up Appointment: Yes Clinical Summary of Care: Electronic Signature(s) Signed: 12/22/2020 9:13:37 AM By: Donnamarie Poag Entered By: Donnamarie Poag on 12/21/2020 16:05:55 [name] ([id number]) -------------------------------------------------------------------------------- Wound Assessment Details Patient Name: [name] Date of Service: 12/21/2020 3:45 PM Medical Record Number: [id number] Patient Account Number: [id number] Date of Birth/Sex:  02/05/1981 (39 y.o. M) Treating RN: Donnamarie Poag Primary Care Tywanda Rice: [name] Other Clinician: Referring Lemoine Goyne: [name] Treating Cattaleya Wien/Extender: [name] Cliche in Treatment: 211 Wound Status Wound Number: 1 Primary Etiology: Pyoderma Wound Location: Left, Lateral Lower Leg Wound Status: Open Wounding Event: Gradually Appeared Comorbid History: Sleep Apnea, Hypertension, Colitis Date Acquired: 11/18/2015 Weeks Of Treatment: 211 Clustered Wound: No Wound Measurements Length: (cm) 6.7 Width: (cm) 7.8 Depth: (cm) 0.7 Area: (cm) 41.045 Volume: (cm) 28.731 % Reduction in Area: -736.1% % Reduction in Volume: -631.6% Epithelialization: Small (1-33%) Wound Description Classification: Full Thickness With Exposed Support Structures Wound Margin: Epibole Exudate Amount: Large Exudate Type: Purulent Exudate Color: yellow, brown, green Foul Odor After Cleansing: No Slough/Fibrino Yes Wound Bed Granulation Amount: Large (67-100%) Exposed Structure Granulation Quality: Red, Pink Fascia Exposed: No Necrotic Amount: Small (1-33%) Fat Layer (Subcutaneous Tissue) Exposed: Yes Necrotic Quality: Adherent Slough Tendon Exposed: No Muscle Exposed: Yes Necrosis of Muscle: No Joint Exposed: No Bone Exposed: No Treatment Notes Wound #1 (Lower Leg) Wound Laterality: Left, Lateral Cleanser Soap and Water Discharge Instruction: Gently cleanse wound with antibacterial soap, rinse and pat dry prior to dressing wounds Peri-Wound Care Topical Triamcinolone Acetonide Cream, 0.1%, 15 (g) tube Discharge Instruction: Apply in office only to 7 o'clock area Primary Dressing Aquacel Extra Hydrofiber Dressing, 4x5 (in/in) Secondary Dressing Xtrasorb Medium 4x5 (in/in) Discharge Instruction: Apply to wound as directed. Do not cut. Secured With [place] ([id number]) Compression Wrap Medichoice 4 layer Compression System, 35-40 mmHG Discharge Instruction: Apply  multi-layer wrap as directed. Compression Stockings Add-Ons Electronic Signature(s) Signed: 12/22/2020 9:13:37 AM By: Donnamarie Poag Entered By: Donnamarie Poag on 12/21/2020 16:03:50

## 2020-12-25 NOTE — Unmapped (Signed)
[facility]

## 2020-12-26 ENCOUNTER — Other Ambulatory Visit: Payer: Self-pay

## 2020-12-26 DIAGNOSIS — L97222 Non-pressure chronic ulcer of left calf with fat layer exposed: Secondary | ICD-10-CM | POA: Diagnosis not present

## 2020-12-26 NOTE — Progress Notes (Addendum)
[name], [name] ([id number]) Visit Report for 12/26/2020 Arrival Information Details Patient Name: [name], [name] Date of Service: 12/26/2020 10:45 AM Medical Record Number: [id number] Patient Account Number: [id number] Date of Birth/Sex: [birth year] (40 y.o. M) Treating RN: [name] Primary Care [name]: [name] Other Clinician: Referring [name]: [name] Treating [name]/Extender: [name] in Treatment: 84 Visit Information History Since Last Visit Pain Present Now: No Patient Arrived: Ambulatory Arrival Time: 10:40 Accompanied By: self Transfer Assistance: None Patient Identification Verified: Yes Secondary Verification Process Completed: Yes Patient Requires Transmission-Based No Precautions: Patient Has Alerts: Yes Patient Alerts: Patient has reaction to silver dressings. Electronic Signature(s) Signed: 12/26/2020 11:11:35 AM By: [name] Amen RN Entered By: [name] on 12/26/2020 11:11:35 [name] ([id number]) -------------------------------------------------------------------------------- Clinic Level of Care Assessment Details Patient Name: [name], [name] Date of Service: 12/26/2020 10:45 AM Medical Record Number: [id number] Patient Account Number: [id number] Date of Birth/Sex: November 13, 1980 (40 y.o. M) Treating RN: [name] Primary Care [name]: [name] Other Clinician: Referring [name]: [name] Treating [name] in Treatment: 211 Clinic Level of Care Assessment Items TOOL 1 Quantity Score []  - Use when EandM and Procedure is performed on INITIAL visit 0 ASSESSMENTS - Nursing Assessment / Reassessment []  - General Physical Exam (combine w/ comprehensive assessment (listed just below) when performed on new 0 pt. evals) []  - 0 Comprehensive Assessment (HX, ROS, Risk Assessments, Wounds Hx, etc.) ASSESSMENTS - Wound and Skin Assessment / Reassessment []  - Dermatologic / Skin  Assessment (not related to wound area) 0 ASSESSMENTS - Ostomy and/or Continence Assessment and Care []  - Incontinence Assessment and Management 0 []  - 0 Ostomy Care Assessment and Management (repouching, etc.) PROCESS - Coordination of Care []  - Simple Patient / Family Education for ongoing care 0 []  - 0 Complex (extensive) Patient / Family Education for ongoing care []  - 0 Staff obtains Programmer, systems, Records, Test Results / Process Orders []  - 0 Staff telephones HHA, Nursing Homes / Clarify orders / etc []  - 0 Routine Transfer to another Facility (non-emergent condition) []  - 0 Routine Hospital Admission (non-emergent condition) []  - 0 New Admissions / Biomedical engineer / Ordering NPWT, Apligraf, etc. []  - 0 Emergency Hospital Admission (emergent condition) PROCESS - Special Needs []  - Pediatric / Minor Patient Management 0 []  - 0 Isolation Patient Management []  - 0 Hearing / Language / Visual special needs []  - 0 Assessment of Community assistance (transportation, D/C planning, etc.) []  - 0 Additional assistance / Altered mentation []  - 0 Support Surface(s) Assessment (bed, cushion, seat, etc.) INTERVENTIONS - Miscellaneous []  - External ear exam 0 []  - 0 Patient Transfer (multiple staff / Civil Service fast streamer / Similar devices) []  - 0 Simple Staple / Suture removal (25 or less) []  - 0 Complex Staple / Suture removal (26 or more) []  - 0 Hypo/Hyperglycemic Management (do not check if billed separately) []  - 0 Ankle / Brachial Index (ABI) - do not check if billed separately Has the patient been seen at the hospital within the last three years: Yes Total Score: 0 Level Of Care: ____ [name] ([id number]) Electronic Signature(s) Signed: 12/26/2020 1:23:29 PM By: [name] Amen RN Entered By: [name] on 12/26/2020 11:12:46 [name] ([id number]) -------------------------------------------------------------------------------- Compression Therapy  Details Patient Name: [name] Date of Service: 12/26/2020 10:45 AM Medical Record Number: [id number] Patient Account Number: [id number] Date of Birth/Sex: 1980/06/14 (40 y.o. M) Treating RN: [name] Primary Care Briahna Pescador: [name] Other Clinician: Referring [name]: [name] Treating  [name]/Extender: [name] Weeks in Treatment: 211 Compression Therapy Performed for Wound Assessment: Wound #1 Left,Lateral Lower Leg Performed By: [name] Daniels, RN Compression Type: Four Layer Pre Treatment ABI: 1.2 Electronic Signature(s) Signed: 12/26/2020 11:12:05 AM By: [name] Amen RN Entered By: [name] on 12/26/2020 11:12:05 [name] ([id number]) -------------------------------------------------------------------------------- Encounter Discharge Information Details Patient Name: [name] Date of Service: 12/26/2020 10:45 AM Medical Record Number: [id number] Patient Account Number: [id number] Date of Birth/Sex: 05/14/1981 (40 y.o. M) Treating RN: [name] Primary Care [name] Other Clinician: Referring [name]: [name] Treating [name] in Treatment: 211 Encounter Discharge Information Items Discharge Condition: Stable Ambulatory Status: Ambulatory Discharge Destination: Home Transportation: Private Auto Accompanied By: self Schedule Follow-up Appointment: Yes Clinical Summary of Care: Electronic Signature(s) Signed: 12/26/2020 11:12:40 AM By: [name] Amen RN Entered By: [name] on 12/26/2020 11:12:40 [name] ([id number]) -------------------------------------------------------------------------------- Wound Assessment Details Patient Name: [name] Date of Service: 12/26/2020 10:45 AM Medical Record Number: [id number] Patient Account Number: [id number] Date of Birth/Sex: 10-02-1980 (40 y.o. M) Treating RN: [name] Primary Care [name]: [name] Other Clinician: Referring [name]: [name] Treating [name]/Extender: [name] in Treatment: 211 Wound Status Wound Number: 1 Primary Etiology: Pyoderma Wound Location: Left, Lateral Lower Leg Wound Status: Open Wounding Event: Gradually Appeared Date Acquired: 11/18/2015 Weeks Of Treatment: 211 Clustered Wound: No Wound Measurements Length: (cm) 6.7 Width: (cm) 7.8 Depth: (cm) 0.7 Area: (cm) 41.045 Volume: (cm) 28.731 % Reduction in Area: -736.1% % Reduction in Volume: -631.6% Wound Description Classification: Full Thickness With Exposed Support Structures Exudate Amount: Large Exudate Type: Purulent Exudate Color: yellow, brown, green Treatment Notes Wound #1 (Lower Leg) Wound Laterality: Left, Lateral Cleanser Soap and Water Discharge Instruction: Gently cleanse wound with antibacterial soap, rinse and pat dry prior to dressing wounds Peri-Wound Care Topical Triamcinolone Acetonide Cream, 0.1%, 15 (g) tube Discharge Instruction: Apply in office only to 7 o'clock area Primary Dressing Aquacel Extra Hydrofiber Dressing, 4x5 (in/in) Secondary Dressing Xtrasorb Medium 4x5 (in/in) Discharge Instruction: Apply to wound as directed. Do not cut. Secured With Compression Wrap Medichoice 4 layer Compression System, 35-40 mmHG Discharge Instruction: Apply multi-layer wrap as directed. Compression Stockings Add-Ons Electronic Signature(s) Signed: 12/26/2020 1:23:29 PM By: [name] Amen RN Entered By: [name] on 12/26/2020 11:11:47

## 2020-12-28 ENCOUNTER — Encounter: Payer: 59 | Admitting: Physician Assistant

## 2020-12-28 ENCOUNTER — Other Ambulatory Visit: Payer: Self-pay

## 2020-12-28 DIAGNOSIS — L97222 Non-pressure chronic ulcer of left calf with fat layer exposed: Secondary | ICD-10-CM | POA: Diagnosis not present

## 2020-12-28 NOTE — Progress Notes (Addendum)
ALEXANDAR, WEISENBERGER ([id number]) Visit Report for 12/28/2020 Chief Complaint Document Details Patient Name: [name], [name] Date of Service: 12/28/2020 8:15 AM Medical Record Number: [id number] Patient Account Number: [id number] Date of Birth/Sex: 04-Aug-1980 (40 y.o. 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 M) Treating RN: Dolan Amen Primary Care Provider: [name] Other Clinician: Referring Provider: [name] Treating Provider/Extender: [name] Cliche in Treatment: 212 History of Present Illness HPI Description: 12/04/16; 40 year old man who comes into the clinic today for review of a wound on the posterior left calf. [name] tells me that is been there for about a year. [name] is not a diabetic [name] does smoke half a pack per day. [name] was seen in the ER on 11/20/16 felt to have cellulitis around the wound and was given clindamycin. An x-ray did not show osteomyelitis. 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 [name] has been using various topical antibiotics and ointments without any effect. [name] works in a [employer] and is on his feet all day. [name] does not have a  prior history of wounds however [name] does have the rash on both lower legs the right arm and the ventral aspect of his left arm. These are excoriations and clearly have had scratching however there are of macular looking areas on both legs including a substantial larger area on the right leg. This does not have an underlying open area. There is no blistering. 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 [name] seemed to recognize this. On further questioning [name] admits to a 5 year history of quiesced. ulcerative colitis. [name] is not in any treatment for this. [name]'s had no recent travel 12/11/16; the patient arrives today with his wound and roughly the same condition we've been using silver alginate this is a deep punched out wound with some surrounding erythema but no tenderness. Biopsy I did did not show confirmed pyoderma gangrenosum suggested nonspecific inflammation and vasculitis but does not provide an actual description of what was seen by the pathologist. I'm really not able to understand this We have also received information from the patient's dermatologist in [place] notes from April 2016. This was a doctor Agarwal-antal. 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 [name] was prescribed topical steroid high potency under occlusion which helped but at this point the patient did not have a deep punched out wound. 12/18/16; the patient's wound is larger in terms of surface area however this surface looks better and there is less depth. The surrounding erythema also is better. The patient states that the wrap we put on came off 2 days ago when [name] has been using his compression stockings.  [name] we are in the process of getting a dermatology consult. 12/26/16 on evaluation today patient's left lower extremity wound shows evidence of  infection with surrounding erythema noted. [name] has been tolerating the dressing changes but states that [name] has noted more discomfort. There is a larger area of erythema surrounding the wound. No fevers, chills, nausea, or vomiting noted at this time. With that being said the wound still does have slough covering the surface. [name] is not allergic to any medication that [name] is aware of at this point. In regard to his right lower extremity [name] had several regions that are erythematous and pruritic [name] wonders if there's anything we can do to help that. 01/02/17 I reviewed patient's wound culture which was obtained his visit last week. [name] was placed on doxycycline at that point. Unfortunately that does not appear to be an antibiotic that would likely help with the situation however the pseudomonas noted on culture is sensitive to Cipro. Also unfortunately patient's wound seems to have a large compared to last week's evaluation. Not severely so but there are definitely increased measurements in general. [name] is continuing to have discomfort as well [name] writes this to be a seven out of 10. In fact [name] would prefer me not to perform any debridement today due to the fact that [name] is having discomfort and considering [name] has an active infection on the little reluctant to do so anyway. No fevers, chills, nausea, or vomiting noted at this time. 01/08/17; patient seems dermatology on September 5. I suspect dermatology will want the slides from the biopsy I did sent to their pathologist. I'm not sure if there is a way we can expedite that. In any case the culture I did before I left on vacation 3 weeks ago showed Pseudomonas [name] was given 10 days of Cipro and per her description of her intake nurses is actually somewhat better this week although the wound is quite a bit bigger than I remember the last time I saw this. [name] still has 3 more days of Cipro 01/21/17; dermatology appointment tomorrow. [name] has completed the ciprofloxacin  for Pseudomonas. Surface of the wound looks better however [name] is had some deterioration in the lesions on his right leg. Meantime the left lateral leg wound we will continue with sample 01/29/17; patient had his dermatology appointment but I can't yet see that note. [name] is completed his antibiotics. The wound is more superficial but considerably larger in circumferential area than when [name] came in. This is in his left lateral calf. [name] also has swollen erythematous areas with superficial wounds on the right leg and small papular areas on both arms. There apparently areas in her his upper thighs and buttocks I did not look at those. Dermatology biopsied the right leg. Hopefully will have their input next week. 02/05/17; patient went back to see his dermatologist who told [name] that [name] had a "scratching problem" as well as staph. [name] is now on a 30 day course of doxycycline and I believe she gave [name] triamcinolone cream to the right leg areas to help with the itching [not exactly sure but probably triamcinolone]. She apparently looked at the left lateral leg wound although this was not rebiopsied and I think felt to be ultimately part of the same pathogenesis. [name] is using sample border foam and changing nevus himself. [name] now has a new open area on the right posterior leg which was his biopsy site I don't have any of the dermatology notes  02/12/17; we put the patient in compression last week with SANTYL to the wound on the left leg and the biopsy. Edema is much better and the depth of the wound is now at level of skin. Area is still the same oBiopsy site on the right lateral leg we've also been using santyl with a border foam dressing and [name] is changing this himself. 02/19/17; Using silver alginate started last week to both the substantial left leg wound and the biopsy site on the right wound. [name] is tolerating compression well. Has a an appointment with his primary M.D. tomorrow wondering about diuretics although  I'm wondering if the edema problem is actually lymphedema 02/26/17; the patient has been to see his primary doctor Dr. Jerrel Ivory at [facility] our primary care. She started [name] on Lasix 20 mg and this seems to have helped with the edema. However we are not making substantial change with the left lateral calf wound and inflammation. The biopsy site on the right leg also looks stable but not really all that different. 03/12/17; the patient has been to see vein and vascular Dr. Lucky Cowboy. [name] has had venous reflux studies I have not reviewed these. I did get a call from his dermatology office. They felt that [name] might have pathergy based on their biopsy on his right leg which led them to look at the slides of JAEDEN, MESSER ([id number]) the biopsy I did on the left leg and they wonder whether this represents pyoderma gangrenosum which was the original supposition in a man with ulcerative colitis albeit inactive for many years. They therefore recommended clobetasol and tetracycline i.e. 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 03/26/17; apparently the patient just had reflux studies not an appointment with Dr. dew. She arrives in clinic today having applied clobetasol for 2-3 weeks. [name] notes over the last 2-3 days excessive drainage having to change the dressing 3-4 times a day and also expanding erythema. [name] states the expanding erythema seems to come and go and was last this red was earlier in the month.[name] is on doxycycline 150 mg twice a day as an anti-inflammatory systemic therapy for possible pyoderma gangrenosum along with the topical clobetasol 04/02/17; the patient was seen last week by Dr. Lillia Carmel at [facility] dermatology locally who kindly saw [name] at my request. 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 My concern was the degree of erythema medially extending from his left leg wound which was either inflammation from pyoderma  or cellulitis. I put [name] on Augmentin however culture of the wound showed Pseudomonas which is quinolone sensitive. I really don't believe [name] has cellulitis however in view of everything I will continue and give [name] a course of Cipro. [name] is also on doxycycline as an immune modulator for the pyoderma. In addition to his original wound on the left lateral leg with surrounding erythema [name] has a wound on the right posterior calf which was an original biopsy site done by dermatology. 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 Saw Dr. Lillia Carmel yesterday. [name] has been using topical antibiotics to both wound areas his original wound on the left and the biopsies/pathergy area on the right. There is definitely some improvement in the inflammation around the wound on the right although the patient states [name] has increasing sensitivity of the wounds. [name] is on prednisone 60 and doxycycline 1 as prescribed by Dr. Lillia Carmel. [name] is covering the topical antibiotic with gauze  and putting this in his own compression stocks and changing this daily. [name] states that Dr. Lottie Rater did a culture of the left leg wound yesterday 05/07/17; pyoderma gangrenosum. The patient saw Dr. Lillia Carmel yesterday and has a follow-up with her in one month. [name] is still using topical antibiotics to both wounds although [name] can't recall exactly what type. [name] is still on prednisone 60 mg. Dr. Lillia Carmel stated that the doxycycline could stop if we were in agreement. 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 The right medial leg was induced by biopsy/pathergy. The area on the right is essentially healed. 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 The right medial leg has closed and remains closed. [name]  is still on prednisone 60. oHe tells me [name] missed his last dermatology appointment with Dr. Lillia Carmel but will make another appointment. [name] reports that her blood sugar at a recent screen in [place] was high 200's. [name] was 180 today. [name] is more cushingoid blood pressure is up a bit. I think [name] is going to require still much longer prednisone perhaps another 3 months before attempting to taper. In the meantime his wound is a lot better. Smaller. [name] is cleaning this off daily and applying topical antibiotics. When [name] was last in the clinic I thought about changing to [facility] and actually put in a couple of calls to dermatology although probably not during their business hours. In any case the wound looks better smaller I don't think there is any need to change what [name] is doing 08/06/17-[name] is here in follow up evaluation for pyoderma left leg ulcer. [name] continues on oral prednisone. [name] has been using triple antibiotic ointment. There is surface debris and we will transition to [facility] and have [name] return in 2 weeks. [name] has lost 30 pounds since his last appointment with lifestyle modification. [name] may benefit from topical steroid cream for treatment this can be considered at a later date. 08/22/17 on evaluation today patient appears to actually be doing rather well in regard to his left lateral lower extremity ulcer. [name] has actually been managed by Dr. Dellia Nims most recently. Patient is currently on oral steroids at this time. This seems to have been of benefit for [name]. Nonetheless his last visit was actually with Leah on 08/06/17. Currently [name] is not utilizing any topical steroid creams although this could be of benefit as well. No fevers, chills, nausea, or vomiting noted at this time. 09/05/17 on evaluation today patient appears to be doing better in regard to his left lateral lower extremity ulcer. [name] has been tolerating the dressing changes without complication. [name] is using Santyl with good effect. Overall I'm  very pleased with how things are standing at this point. Patient likewise is happy that this is doing better. 09/19/17 on evaluation today patient actually appears to be doing rather well in regard to his left lateral lower extremity ulcer. Again this is secondary to Pyoderma gangrenosum and [name] seems to be progressing well with the Santyl which is good news. [name]'s not having any significant pain. 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 [name] has been tolerating the Santyl without complication and in general I feel like [name]'s making good progress. 10/17/17 on evaluation today patient  appears to be doing very well in regard to his left lateral lower surety ulcer. [name] has been tolerating the dressing changes without complication. There does not appear to be any evidence of infection [name]'s alternating the Santyl and the triple antibiotic ointment every other day this seems to be doing well for [name]. 11/03/17 on evaluation today patient appears to be doing very well in regard to his left lateral lower extremity ulcer. [name] is been tolerating the dressing changes without complication which is good news. Fortunately there does not appear to be any evidence of infection which is also great news. Overall is doing excellent they are starting to taper down on the prednisone is down to 40 mg at this point it also started topical clobetasol for [name]. 11/17/17 on evaluation today patient appears to be doing well in regard to his left lateral lower surety ulcer. [name]'s been tolerating the dressing changes without complication. [name] does note that [name] is having no pain, no excessive drainage or discharge, and overall [name] feels like things are going about how [name] would expect and hope they would. Overall [name] seems to have no evidence of infection at this time in my opinion which is good news. 12/04/17-[name] is seen in follow-up  evaluation for right lateral lower extremity ulcer. [name] has been applying topical steroid cream. Today's measurement show slight increase in size. Over the next 2 weeks we will transition to every other day Santyl and steroid cream. [name] has been encouraged to monitor for changes and notify clinic with any concerns 12/15/17 on evaluation today patient's left lateral motion the ulcer and fortunately is doing worse again at this point. This just since last week to this week has close to doubled in size according to the patient. I did not seeing last week's I do not have a visual to compare this to in our system was also down so we do not have all the charts and at this point. Nonetheless it does have me somewhat concerned in regard to the fact that again [name] was worried enough about it [name] has contact the dermatology that placed them back on the full strength, 50 mg a day of the prednisone that [name] was taken previous. [name] continues to alternate using clobetasol along with Santyl at this point. [name] is obviously somewhat frustrated. 12/22/17 on evaluation today patient appears to be doing a little worse compared to last evaluation. Unfortunately the wound is a little deeper and slightly larger than the last week's evaluation. With that being said [name] has made some progress in regard to the irritation surrounding at this time unfortunately despite that progress that's been made [name] still has a significant issue going on here. I'm not certain that [name] is having really any true infection at this time although with the Pyoderma gangrenosum it can sometimes be difficult to differentiate infection versus just inflammation. ELBY, BLACKWELDER ([id number]) For that reason I discussed with [name] today the possibility of perform a wound culture to ensure there's nothing overtly infected. 01/06/18 on evaluation today patient's wound is larger and deeper than previously evaluated. With that being said it did appear that his wound was  infected after my last evaluation with [name]. Subsequently I did end up prescribing a prescription for Bactrim DS which she has been taking and having no complication with. Fortunately there does not appear to be any evidence of infection at this point in time as far as anything spreading, no want to touch, and overall I feel like  things are showing signs of improvement. 01/13/18 on evaluation today patient appears to be even a little larger and deeper than last time. There still muscle exposed in the base of the wound. Nonetheless [name] does appear to be less erythematous I do believe inflammation is calming down also believe the infection looks like it's probably resolved at this time based on what I'm seeing. No fevers, chills, nausea, or vomiting noted at this time. 01/30/18 on evaluation today patient actually appears to visually look better for the most part. Unfortunately those visually this looks better [name] does seem to potentially have what may be an abscess in the muscle that has been noted in the central portion of the wound. This is the first time that I have noted what appears to be fluctuance in the central portion of the muscle. With that being said I'm somewhat more concerned about the fact that this might indicate an abscess formation at this location. I do believe that an ultrasound would be appropriate. This is likely something we need to try to do as soon as possible. [name] has been switch to mupirocin ointment and [name] is no longer using the steroid ointment as prescribed by dermatology [name] sees them again next week [name]'s been decreased from 60 to 40 mg of prednisone. 03/09/18 on evaluation today patient actually appears to be doing a little better compared to last time I saw [name]. There's not as much erythema surrounding the wound itself. [name] I did review his most recent infectious disease note which was dated 02/24/18. [name] saw Dr. Michel Bickers in [place]. 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 Patient is now off of antibiotics and they are continually observing this. There seems to be no change in the past two weeks in my pinion based on what the patient says and what I see today compared to what Dr. Megan Salon likely saw two weeks ago. No fevers, chills, nausea, or vomiting noted at this time. 03/23/18 on evaluation today patient's wound actually appears to be showing signs of improvement which is good news. [name] is currently still on the Dapsone. [name] is also working on tapering the prednisone to get off of this and Dr. Lottie Rater is working with [name] in this regard. Nonetheless overall I feel like the wound is doing well it does appear based on the infectious disease note that I reviewed from Dr. 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 I did also review the results of his ultrasound of left lower extremity which revealed there was a dentist tissue in the base of the wound without an abscess noted. 04/06/18 on evaluation today the patient's left lateral lower extremity ulcer actually appears to be doing fairly well which is excellent news. There does not appear to be any evidence of infection at this time which is also great news. Overall [name] still does have a significantly large ulceration although little by little [name] seems to be making progress. [name] is down to 10 mg a day of the prednisone. 04/20/18 on evaluation today patient actually appears to be doing excellent at this time in regard to his left lower extremity ulcer. [name]'s making signs of good progress unfortunately this is taking much longer than we would really like to see but nonetheless [name] is  making progress. Fortunately there does not appear to be any evidence of infection at this time. No fevers, chills, nausea, or vomiting noted at  this time. The patient has not been using the Santyl due to the cost [name] hadn't got in this field yet. [name]'s mainly been using the antibiotic ointment topically. Subsequently [name] also tells me that [name] really has not been scrubbing in the shower I think this would be helpful again as I told [name] it doesn't have to be anything too aggressive to even make it believe just enough to keep it free of some of the loose slough and biofilm on the wound surface. 05/11/18 on evaluation today patient's wound appears to be making slow but sure progress in regard to the left lateral lower extremity ulcer. [name] is been tolerating the dressing changes without complication. Fortunately there does not appear to be any evidence of infection at this time. [name] is still just using triple antibiotic ointment along with clobetasol occasionally over the area. [name] never got the Santyl and really does not seem to intend to in my pinion. 06/01/18 on evaluation today patient appears to be doing a little better in regard to his left lateral lower extremity ulcer. [name] states that overall [name] does not feel like [name] is doing as well with the Dapsone as [name] did with the prednisone. Nonetheless [name] sees his dermatologist later today and is gonna talk to them about the possibility of going back on the prednisone. Overall again I believe that the wound would be better if you would utilize Santyl but [name] really does not seem to be interested in going back to the [place] at this point. [name] has been using triple antibiotic ointment. 06/15/18 on evaluation today patient's wound actually appears to be doing about the same at this point. Fortunately there is no signs of infection at this time. [name] has made slight improvements although [name] continues to not really want to clean the wound bed at this point. [name] states that [name] just doesn't mess with it [name] doesn't want to cause any problems with everything else [name] has going on. [name] has been on medication, antibiotics  as prescribed by his dermatologist, for a staff infection of his lower extremities which is really drying out now and looking much better [name] tells me. Fortunately there is no sign of overall infection. 06/29/18 on evaluation today patient appears to be doing well in regard to his left lateral lower surety ulcer all things considering. Fortunately his staff infection seems to be greatly improved compared to previous. [name] has no signs of infection and this is drying up quite nicely. [name] is still the doxycycline for this is no longer on cental, Dapsone, or any of the other medications. His dermatologist has recommended possibility of an infusion but right now [name] does not want to proceed with that. 07/13/18 on evaluation today patient appears to be doing about the same in regard to his left lateral lower surety ulcer. Fortunately there's no signs of infection at this time which is great news. Unfortunately [name] still builds up a significant amount of Slough/biofilm of the surface of the wound [name] still is not really cleaning this as [name] should be appropriately. Again I'm able to easily with saline and gauze remove the majority of this on the surface which if you would do this at home would likely be a dramatic improvement for [name] as far as getting the area to improve. Nonetheless overall I still feel like [name]  is making progress is just very slow. I think Santyl will be of benefit for [name] as well. Still [name] has not gotten this as of this point. 07/27/18 on evaluation today patient actually appears to be doing little worse in regards of the erythema around the periwound region of the wound [name] also tells me that [name]'s been having more drainage currently compared to what [name] was experiencing last time I saw [name]. [name] states not quite as bad as what [name] had because this was infected previously but nonetheless is still appears to be doing poorly. Fortunately there is no evidence of systemic infection at this point. The patient  tells me that [name] is not going to be able to afford the Santyl. [name] is still waiting to hear about the infusion therapy with his dermatologist. Apparently she wants an updated colonoscopy first. 08/10/18 on evaluation today patient appears to be doing better in regard to his left lateral lower extremity ulcer. Fortunately [name] is showing signs of improvement in this regard [name]'s actually been approved for Remicade infusion's as well although this has not been scheduled as of yet. Fortunately there's no signs of active infection at this time in regard to the wound although [name] is having some issues with infection of the right lower extremity is been seen as dermatologist for this. Fortunately they are definitely still working with [name] trying to keep things under control. RUAL, VERMEER ([id number]) 09/07/18 on evaluation today patient is actually doing rather well in regard to his left lateral lower extremity ulcer. [name] notes these actually having some hair grow back on his extremity which is something [name] has not seen in years. [name] also tells me that the pain is really not giving them any trouble at this time which is also good news overall she is very pleased with the progress [name]'s using a combination of the mupirocin along with the probate is all mixed. 09/21/18 on evaluation today patient actually appears to be doing fairly well all things considered in regard to his looks from the ulcer. [name]'s been tolerating the dressing changes without complication. Fortunately there's no signs of active infection at this time which is good news [name] is still on all antibiotics or prevention of the staff infection. [name] has been on prednisone for time although [name] states it is gonna contact his dermatologist and see if she put them on a short course due to some irritation that [name] has going on currently. Fortunately there's no evidence of any overall worsening this is going very slow I think cental would be something that would be  helpful for [name] although [name] states that $50 for tube is quite expensive. [name] therefore is not willing to get that at this point. 10/06/18 on evaluation today patient actually appears to be doing decently well in regard to his left lateral leg ulcer. [name]'s been tolerating the dressing changes without complication. Fortunately there's no signs of active infection at this time. Overall I'm actually rather pleased with the progress [name]'s making although it's slow [name] doesn't show any signs of infection and [name] does seem to be making some improvement. I do believe that [name] may need a switch up and dressings to try to help this to heal more appropriately and quickly. 10/19/18 on evaluation today patient actually appears to be doing better in regard to his left lateral lower extremity ulcer. This is shown signs of having much less Slough buildup at this point due to the fact [name] has been using  the Santyl. Obviously this is very good news. The overall size of the wound is not dramatically smaller but again the appearance is. 11/02/18 on evaluation today patient actually appears to be doing quite well in regard to his lower Trinity ulcer. A lot of the skin around the ulcer is actually somewhat irritating at this point this seems to be more due to the dressing causing irritation from the adhesive that anything else. Fortunately there is no signs of active infection at this time. 11/24/18 on evaluation today patient appears to be doing a little worse in regard to his overall appearance of his lower extremity ulcer. There's more erythema and warmth around the wound unfortunately. [name] is currently on doxycycline which [name] has been on for some time. With that being said I'm not sure that seems to be helping with what appears to possibly be an acute cellulitis with regard to his left lower extremity ulcer. No fevers, chills, nausea, or vomiting noted at this time. 12/08/18 on evaluation today patient's wounds actually appears to  be doing significantly better compared to his last evaluation. [name] has been using Santyl along with alternating tripling about appointment as well as the steroid cream seems to be doing quite well and the wound is showing signs of improvement which is excellent news. Fortunately there's no evidence of infection and in fact his culture came back negative with only normal skin flora noted. 12/21/2018 upon evaluation today patient actually appears to be doing excellent with regard to his ulcer. This is actually the best that I have seen it since have been helping to take care of [name]. It is both smaller as well as less slough noted on the surface of the wound and seems to be showing signs of good improvement with new skin growing from the edges. [name] has been using just the triamcinolone [name] does wonder if [name] can get a refill of that ointment today. 01/04/2019 upon evaluation today patient actually appears to be doing well with regard to his left lateral lower extremity ulcer. With that being said it does not appear to be that [name] is doing quite as well as last time as far as progression is concerned. There does not appear to be any signs of infection or significant irritation which is good news. With that being said I do believe that [name] may benefit from switching to a collagen based dressing based on how clean The wound appears. 01/18/2019 on evaluation today patient actually appears to be doing well with regard to his wound on the left lower extremity. [name] is not made a lot of progress compared to where we were previous but nonetheless does seem to be doing okay at this time which is good news. There is no signs of active infection which is also good news. My only concern currently is I do wish we can get [name] into utilizing the collagen dressing his insurance would not pay for the supplies that we ordered although it appears that [name] may be able to order this through his supply company that [name] typically utilizes.  This is [employer]. Nonetheless [name] did try to order it during the office visit today and it appears this did go through. We will see if [name] can get that it is a different brand but nonetheless [name] has collagen and I do think will be beneficial. 02/01/2019 on evaluation today patient actually appears to be doing a little worse today in regard to the overall size of his wounds. Fortunately there  is no signs of active infection at this time. That is visually. Nonetheless when this is happened before it was due to infection. For that reason were somewhat concerned about that this time as well. 02/08/2019 on evaluation today patient unfortunately appears to be doing slightly worse with regard to his wound upon evaluation today. Is measuring a little deeper and a little larger unfortunately. I am not really sure exactly what is causing this to enlarge [name] actually did see his dermatologist she is going to see about initiating Humira for [name]. Subsequently she also did do steroid injections into the wound itself in the periphery. Nonetheless still nonetheless [name] seems to be getting a little bit larger [name] is gone back to just using the steroid cream topically which I think is appropriate. I would say hold off on the collagen for the time being is definitely a good thing to do. Based on the culture results which we finally did get the final result back regarding it shows staph as the bacteria noted again that can be a normal skin bacteria based on the fact however [name] is having increased drainage and worsening of the wound measurement wise I would go ahead and place [name] on an antibiotic today I do believe for this. 02/15/2019 on evaluation today patient actually appears to be doing somewhat better in regard to his ulcer. There is no signs of worsening at this time I did review his culture results which showed evidence of Staphylococcus aureus but not MRSA. Again this could just be more related to the normal skin  bacteria although [name] states the drainage has slowed down quite a bit [name] may have had a mild infection not just colonization. And was much smaller and then since around10/04/2019 on evaluation today patient appears to be doing unfortunately worse as far as the size of the wound. I really feel like that this is steadily getting larger again it had been doing excellent right at the beginning of September we have seen a steady increase in the area of the wound it is almost 2-1/2 times the size it was on September 1. Obviously this is a bad trend this is not wanting to see. For that reason we went back to using just the topical triamcinolone cream which does seem to help with inflammation. I checked [name] for bacteria by way of culture and nothing showed positive there. I am considering giving [name] a short course of a tapering steroid Dosepak today to see if that is can be beneficial for [name]. The patient is in agreement with giving that a try. 03/08/2019 on evaluation today patient appears to be doing very well in comparison to last evaluation with regard to his lower extremity ulcer. This is showing signs of less inflammation and actually measuring slightly smaller compared to last time every other week over the past month and a half [name] has been measuring larger larger larger. Nonetheless I do believe that the issue has been inflammation the prednisone does seem to [facility], Jaking ([id number]) have been beneficial for [name] which is good news. No fevers, chills, nausea, vomiting, or diarrhea. 03/22/2019 on evaluation today patient appears to be doing about the same with regard to his leg ulcer. [name] has been tolerating the dressing changes without complication. With that being said the wound seems to be mostly arrested at its current size but really is not making any progress except for when we prescribed the prednisone. [name] did show some signs of dropping as far as  the overall size of the wound during that interval  week. Nonetheless this is something [name] is not on long-term at this point and unfortunately I think [name] is getting need either this or else the Humira which his dermatologist has discussed try to get approval for. With that being said [name] will be seeing his dermatologist on the 11th of this month that is November. 04/19/2019 on evaluation today patient appears to be doing really about the same the wound is measuring slightly larger compared to last time I saw [name]. [name] has not been into the office since November 2 due to the fact that [name] unfortunately had Covid as that his entire family. [name] tells me that it was rough but they did pull-through and [name] seems to be doing much better. Fortunately there is no signs of active infection at this time. No fevers, chills, nausea, vomiting, or diarrhea. 05/10/2019 on evaluation today patient unfortunately appears to be doing significantly worse as compared to last time I saw [name]. [name] does tell me that [name] has had his first dose of Humira and actually is scheduled to get the next one in the upcoming week. With that being said [name] tells me also that in the past several days [name] has been having a lot of issues with green drainage she showed me a picture this is more blue-green in color. [name] is also been having issues with increased sloughy buildup and the wound does appear to be larger today. Obviously this is not the direction that we want everything to take based on the starting of his Humira. Nonetheless I think this is definitely a result of likely infection and to be honest I think this is probably Pseudomonas causing the infection based on what I am seeing. 05/24/2019 on evaluation today patient unfortunately appears to be doing significantly worse compared to his prior evaluation with me 2 weeks ago. I did review his culture results which showed that [name] does have Staph aureus as well as Pseudomonas noted on the culture. Nonetheless the Levaquin that I prescribed for [name]  does not appear to have been appropriate and in fact [name] tells me [name] is no longer experiencing the green drainage and discharge that [name] had at the last visit. Fortunately there is no signs of active infection at this time which is good news although the wound has significantly worsened it in fact is much deeper than it was previous. We have been utilizing up to this point triamcinolone ointment as the prescription topical of choice but at this time I really feel like that the wound is getting need to be packed in order to appropriately manage this due to the deeper nature of the wound. Therefore something along the lines of an alginate dressing may be more appropriate. 05/31/2019 upon inspection today patient's wound actually showed signs of doing poorly at this point. Unfortunately [name] just does not seem to be making any good progress despite what we have tried. [name] actually did go ahead and pick up the Cipro and start taking that as [name] was noticing more green drainage [name] had previously completed the Levaquin that I prescribed for [name] as well. Nonetheless [name] missed his appointment for the seventh last week on Wednesday with the wound care center and [facility] where his dermatologist referred [name]. Obviously I do think a second opinion would be helpful at this point especially in light of the fact that the patient seems to be doing so poorly despite the fact  that we have tried everything that I really know how at this point. The only thing that ever seems to have helped [name] in the past is when [name] was on high doses of continual steroids that did seem to make a difference for [name]. Right now [name] is on immune modulating medication to try to help with the pyoderma but I am not sure that [name] is getting as much relief at this point as [name] is previously obtained from the use of steroids. 06/07/2019 upon evaluation today patient unfortunately appears to be doing worse yet again with regard to his wound. In fact I  am starting to question whether or not [name] may have a fluid pocket in the muscle at this point based on the bulging and the soft appearance to the central portion of the muscle area. There is not anything draining from the muscle itself at this time which is good news but nonetheless the wound is expanding. I am not really seeing any results of the Humira as far as overall wound progression based on what I am seeing at this point. The patient has been referred for second opinion with regard to his wound to the [facility] wound care center by his dermatologist which I definitely am not in opposition to. Unfortunately we tried multiple dressings in the past including collagen, alginate, and at one point even Hydrofera Blue. With that being said [name] is never really used it for any significant amount of time due to the fact that [name] often complains of pain associated with these dressings and then will go back to either using the Santyl which she has done intermittently or more frequently the triamcinolone. [name] is also using his own compression stockings. We have wrapped [name] in the past but again that was something else that [name] really was not a big fan of. 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 [name] seemed to do much better when [name] was on the steroids although honestly I understand that the steroids are not necessarily the best medication to be on long-term obviously 06/14/2019 on evaluation today patient appears to be doing actually a little bit better with regard to the overall appearance with his leg. Unfortunately [name] does continue to have issues with what appears to be some fluid underneath  the muscle although [name] did see the wound specialty center at [facility] last week their main goals were to see about infusion therapy in place of the Humira as they feel like that is not quite strong enough. They also recommended that we continue with the treatment otherwise as we are they felt like that was appropriate and they are okay with [name] continuing to follow-up here with [place] in that regard. With that being said they are also sending [name] to the vein specialist there to see about vein stripping and if that would be of benefit for [name]. Subsequently they also did not really address whether or not an ultrasound of the muscle area to see if there is anything that needs to be addressed here would be appropriate or not. For that reason I discussed this with [name] last week I think we  may proceed down that road at this point. 06/21/2019 upon evaluation today patient's wound actually appears to be doing slightly better compared to previous evaluations. I do believe that [name] has made a difference with regard to the progression here with the use of oral steroids. Again in the past has been the only thing that is really calm things down. [name] does tell me that from [facility] is gotten a good news from there that there are no further vein stripping that is necessary at this point. I do not have that available for review today although the patient did relay this to me. [name] also did obtain and have the ultrasound of the wound completed which I did sign off on today. It does appear that there is no fluid collection under the muscle this is likely then just edematous tissue in general. That is also good news. Overall I still believe the inflammation is the main issue here. [name] did inquire about the possibility of a wound VAC again with the muscle protruding like it is I am not really sure whether the wound VAC is necessarily ideal or not. That is something we will have to consider although I do believe [name] may need compression  wrapping to try to help with edema control which could potentially be of benefit. 06/28/2019 on evaluation today patient appears to be doing slightly better measurement wise although this is not terribly smaller [name] least seems to be trending towards that direction. With that being said [name] still seems to have purulent drainage noted in the wound bed at this time. [name] has been on Levaquin followed by Cipro over the past month. Unfortunately [name] still seems to have some issues with active infection at this time. I did perform a culture last week in order to evaluate and see if indeed there was still anything going on. Subsequently the culture did come back showing Pseudomonas which is consistent with the drainage has been having which is blue-green in color. [name] also has had an odor that again was somewhat consistent with Pseudomonas as well. Long story short it appears that the culture showed an intermediate finding with regard to how well the Cipro will work for the Pseudomonas infection. Subsequently being that [name] does not seem to be clearing up and at best what we are doing is just keeping this at [place] I think [name] may need to see infectious disease to discuss IV antibiotic options. JERON, GRAHN ([id number]) 07/05/2019 upon evaluation today patient appears to be doing okay in regard to his leg ulcer. [name] has been tolerating the dressing changes at this point without complication. Fortunately there is no signs of active infection at this time which is good news. No fevers, chills, nausea, vomiting, or diarrhea. With that being said [name] does have an appointment with infectious disease tomorrow and his primary care on Wednesday. Again the reason for the infectious disease referral was due to the fact that [name] did not seem to be fully resolving with the use of oral antibiotics and therefore we were thinking that IV antibiotic therapy may be necessary secondary to the fact that there was an intermediate finding for  how effective the Cipro may be. Nonetheless again [name] has been having a lot of purulent and even green drainage. Fortunately right now that seems to have calmed down over the past week with the reinitiation of the oral antibiotic. Nonetheless we will see what Dr. Megan Salon has to say. 07/12/2019 upon evaluation today patient appears to be  doing about the same at this point in regard to his left lower extremity ulcer. Fortunately there is no signs of active infection at this time which is good news I do believe the Levaquin has been beneficial I did review Dr. Hale Bogus note and to be honest I agree that the patient's leg does appear to be doing better currently. What we found in the past as [name] does not seem to really completely resolve [name] will stop the antibiotic and then subsequently things will revert back to having issues with blue-green drainage, increased pain, and overall worsening in general. Obviously that is the reason I sent [name] back to infectious disease. 07/19/2019 upon evaluation today patient appears to be doing roughly the same in size there is really no dramatic improvement. [name] has started back on the Levaquin at this point and though [name] seems to be doing okay [name] did still have a lot of blue/green drainage noted on evaluation today unfortunately. I think that this is still indicative more likely of a Pseudomonas infection as previously noted and again [name] does see Dr. Megan Salon in just a couple of days. I do not know that were really able to effectively clear this with just oral antibiotics alone based on what I am seeing currently. Nonetheless we are still continue to try to manage as best we can with regard to the patient and his wound. I do think the wrap was helpful in decreasing the edema which is excellent news. No fevers, chills, nausea, vomiting, or diarrhea. 07/26/2019 upon evaluation today patient appears to be doing slightly better with regard to the overall appearance of the muscle  there is no dark discoloration centrally. Fortunately there is no signs of active infection at this time. No fevers, chills, nausea, vomiting, or diarrhea. Patient's wound bed currently the patient did have an appointment with Dr. Megan Salon at infectious disease last week. With that being said Dr. Megan Salon the patient states was still somewhat hesitant about put [name] on any IV antibiotics [name] wanted [place] to repeat cultures today and then see where things go going forward. [name] does look like Dr. Megan Salon because of some improvement the patient did have with the Levaquin wanted [place] to see about repeating cultures. If it indeed grows the Pseudomonas again then [name] recommended a possibility of considering a PICC line placement and IV antibiotic therapy. [name] plans to see the patient back in 1 to 2 weeks. 08/02/2019 upon evaluation today patient appears to be doing poorly with regard to his left lower extremity. We did get the results of his culture back it shows that [name] is still showing evidence of Pseudomonas which is consistent with the purulent/blue-green drainage that [name] has currently. Subsequently the culture also shows that [name] now is showing resistance to the oral fluoroquinolones which is unfortunate as that was really the only thing to treat the infection prior. I do believe that [name] is looking like this is going require IV antibiotic therapy to get this under control. Fortunately there is no signs of systemic infection at this time which is good news. The patient does see Dr. Megan Salon tomorrow. 08/09/2019 upon evaluation today patient appears to be doing better with regard to his left lower extremity ulcer in regard to the overall appearance. [name] is currently on IV antibiotic therapy. As ordered by Dr. Megan Salon. Currently the patient is on ceftazidime which she is going to take for the next 2 weeks and then follow-up for 4 to 5-week appointment with Dr. Megan Salon.  The patient started this this past Friday  symptoms have not for a total of 3 days currently in full. 08/16/2019 upon evaluation today patient's wound actually does show muscle in the base of the wound but in general does appear to be much better as far as the overall evidence of infection is concerned. In fact I feel like this is for the most part cleared up [name] still on the IV antibiotics [name] has not completed the full course yet but I think [name] is doing much better which is excellent news. 08/23/2019 upon evaluation today patient appears to be doing about the same with regard to his wound at this point. [name] tells me that [name] still has pain unfortunately. Fortunately there is no evidence of systemic infection at this time which is great news. There is significant muscle protrusion. 09/13/19 upon evaluation today patient appears to be doing about the same in regard to his leg unfortunately. [name] still has a lot of drainage coming from the ulceration there is still muscle exposed. With that being said the patient's last wound culture still showed an intermediate finding with regard to the Pseudomonas [name] still having the bluish/green drainage as well. Overall I do not know that the wound has completely cleared of infection at this point. Fortunately there is no signs of active infection systemically at this point which is good news. 09/20/2019 upon evaluation today patient's wound actually appears to be doing about the same based on what I am seeing currently. I do not see any signs of systemic infection [name] still does have evidence of some local infection and drainage. [name] did see Dr. Megan Salon last week and Dr. Megan Salon states that [name] probably does need a different IV antibiotic although [name] does not want to put [name] on this until the patient begins the Remicade infusion which is actually scheduled for about 10 days out from today on 13 May. Following that time Dr. Megan Salon is good to see [name] back and then will evaluate the feasibility of starting [name] on the  IV antibiotic therapy once again at that point. I do not disagree with this plan I do believe as Dr. Megan Salon stated in his note that I reviewed today that the patient's issue is multifactorial with the pyoderma being 1 aspect of this that were hoping the Remicade will be helpful for her. In the meantime I think the gentamicin is, helping to keep things under decent okay control in regard to the ulcer. 09/27/2019 upon evaluation today patient appears to be doing about the same with regard to his wound still there is a lot of muscle exposure though [name] does have some hyper granulation tissue noted around the edge and actually some granulation tissue starting to form over the muscle which is actually good news. Fortunately there is no evidence of active infection which is also good news. His pain is less at this point. 5/21; this is a patient I have not seen in a long time. 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 [name] has a large wound on the left lateral leg with protruding muscle. [name] comes in the clinic today showing the same area on his left medial ankle. [name] says there is been a spot there for some time although we have not previously defined this. Today [name] has a clearly defined area with slight amount of skin breakdown surrounded by raised areas with a purplish hue in color. This is not painful [name] says it is irritated.  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 [name] still has muscle protruding from the lateral portion of his left leg but fortunately the new area that they were concerned about at his last visit does not appear to have opened at this point. [name] is currently on Remicade infusions and seems to be doing better in my opinion in fact the wound itself seems to be overall much better. The purplish discoloration that [name] did have seems to have resolved  and I think that is a good sign that hopefully the Remicade is doing its job. [name] does have some biofilm noted over the surface of the wound. 11/01/2019 on evaluation today patient's wound actually appears to be doing excellent at this time. Fortunately there is no evidence of active infection and overall I feel like [name] is making great progress. The Remicade seems to be due excellent job in my opinion. TAGGERT, BOZZI ([id number]) 11/08/19 evaluation today vision actually appears to be doing quite well with regard to his weight ulcer. [name]'s been tolerating dressing changes without complication. Fortunately there is no evidence of infection. No fevers, chills, nausea, or vomiting noted at this time. Overall states that is having more itching than pain which is actually a good sign in my opinion. 12/13/2019 upon evaluation today patient appears to be doing well today with regard to his wound. [name] has been tolerating the dressing changes without complication. Fortunately there is no sign of active infection at this time. No fevers, chills, nausea, vomiting, or diarrhea. Overall I feel like the infusion therapy has been very beneficial for [name]. 01/06/2020 on evaluation today patient appears to be doing well with regard to his wound. This is measuring smaller and actually looks to be doing better. Fortunately there is no signs of active infection at this point. No fevers, chills, nausea, vomiting, or diarrhea. With that being said [name] does still have the blue-green drainage but this does not seem to be causing any significant issues currently. [name] has been using the gentamicin that does seem to be keeping things under decent control at this point. [name] goes later this morning for his next infusion therapy for the pyoderma which seems to also be very beneficial. 02/07/2020 on evaluation today patient appears to be doing about the same in regard to his wounds currently. Fortunately there is no signs of active infection  systemically [name] does still have evidence of local infection still using gentamicin. [name] also is showing some signs of improvement albeit slowly I do feel like we are making some progress here. 02/21/2020 upon evaluation today patient appears to be making some signs of improvement the wound is measuring a little bit smaller which is great news and overall I am very pleased with where [name] stands currently. [name] is going to be having infusion therapy treatment on the 15th of this month. Fortunately there is no signs of active infection at this time. 03/13/2020 I do believe patient's wound is actually showing some signs of improvement here which is great news. [name] has continue with the infusion therapy through rheumatology[facility]dermatology at [facility]. That does seem to be beneficial. I still think [name] gets as much benefit from this as [name] did from the prednisone initially but nonetheless obviously this is less harsh on his body that the prednisone as far as they are concerned. 03/31/2020 on evaluation today patient's wound actually showing signs of some pretty good improvement in regard  to the overall appearance of the wound bed. There is still muscle exposed though [name] does have some epithelial growth around the edges of the wound. Fortunately there is no signs of active infection at this time. No fevers, chills, nausea, vomiting, or diarrhea. 04/24/2020 upon evaluation today patient appears to be doing about the same in regard to his leg ulcer. [name] has been tolerating the dressing changes without complication. Fortunately there is no signs of active infection at this time. No fevers, chills, nausea, vomiting, or diarrhea. With that being said [name] still has a lot of irritation from the bandaging around the edges of the wound. We did discuss today the possibility of a referral to plastic surgery. 05/22/2020 on evaluation today patient appears to be doing well with regard to his wounds all things considered. [name] has not been able  to get the Chantix apparently there is a recall nurse that I was unaware of put out by [employer] involuntarily. Nonetheless for now I am and I have to do some research into what may be the best option for [name] to help with quitting in regard to smoking and we discussed that today. 06/26/2020 upon evaluation today patient appears to be doing well with regard to his wound from the standpoint of infection I do not see any signs of infection at this point. With that being said unfortunately [name] is still continuing to have issues with muscle exposure and again [name] is not having a whole lot of new skin growth unfortunately. There does not appear to be any signs of active infection at this time. No fevers, chills, nausea, vomiting, or diarrhea. 07/10/2020 upon evaluation today patient appears to be doing a little bit more poorly currently compared to where [name] was previous. I am concerned currently about an active infection that may be getting worse especially in light of the increased size and tenderness of the wound bed. No fevers, chills, nausea, vomiting, or diarrhea. 07/24/2020 upon evaluation today patient appears to be doing poorly in regard to his leg ulcer. [name] has been tolerating the dressing changes without complication but unfortunately is having a lot of discomfort. 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 Subsequently I think [name] is going require possibly IV antibiotics to get this under control. I am very concerned about the severity of his infection and the amount of discomfort [name] is having. 07/31/2020 upon evaluation today patient appears to be doing about the same in regard to his leg wound. [name] did see Dr. Megan Salon and Dr. Megan Salon is actually going to start [name] on IV antibiotics. [name] goes for the PICC line tomorrow. With that being said there do not have that run for 2 weeks and then see how things are doing and depending on how [name]  is progressing they may extend that a little longer. Nonetheless I am glad this is getting ready to be in place and definitely feel it may help the patient. In the meantime is been using mainly triamcinolone to the wound bed has an anti-inflammatory. 08/07/2020 on evaluation today patient appears to be doing well with regard to his wound compared even last week. In the interim [name] has gotten the PICC line placed and overall this seems to be doing excellent. There does not appear to be any evidence of infection which is great news systemically although locally of course has had the infection this appears to be improving with the use of the antibiotics. 08/14/2020 upon  evaluation today patient's wound actually showing signs of excellent improvement. Overall the irritation has significantly improved the drainage is back down to more of a normal level and his pain is really pretty much nonexistent compared to what it was. Obviously I think that this is significantly improved secondary to the IV antibiotic therapy which has made all the difference in the world. Again [name] had a resistant form of Pseudomonas for which oral antibiotics just was not cutting it. Nonetheless I do think that still we need to consider the possibility of a surgical closure for this wound is been open so long and to be honest with muscle exposed I think this can be very hard to get this to close outside of this although definitely were still working to try to do what we can in that regard. 08/21/2020 upon evaluation today patient appears to be doing very well with regard to his wounds on the left lateral lower extremity/calf area. Fortunately there does not appear to be signs of active infection which is great news and overall very pleased with where things stand today. [name] is actually wrapping up his treatment with IV antibiotics tomorrow. After that we will see where things go from there. 08/28/2020 upon evaluation today patient appears  to be doing decently well with regard to his leg ulcer. There does not appear to be any signs of active infection which is great news and overall very pleased with where things stand today. No fevers, chills, nausea, vomiting, or diarrhea. 09/18/2020 upon evaluation today patient appears to be doing well with regard to his infection which I feel like is better. Unfortunately [name] is not doing as well with regard to the overall size of the wound which is not nearly as good at this point. I feel like that [name] may be having an issue here with the pyoderma being somewhat out of control. I think that [name] may benefit from potentially going back and talking to the dermatologist about ALARIK, RADU ([id number]) what to do from the pyoderma standpoint. I am not certain if the infusions are helping nearly as much is what the prednisone did in the past. 10/02/2020 upon evaluation today patient appears to be doing well with regard to his leg ulcer. [name] did go to the Psychiatric nurse. Unfortunately they feel like there is a 10% chance that most that [name] would be able to heal and that the skin graft would take. Obviously this has led [name] to not be able to go down that path as far as treatment is concerned. Nonetheless [name] does seem to be doing a little bit better with the prednisone that I gave [name] last time. I think that [name] may need to discuss with dermatology the possibility of long-term prednisone as that seems to be what is most helpful for [name] to be perfectly honest. I am not sure the Remicade is really doing the job. 10/17/2020 upon evaluation today patient appears to be doing a little better in regard to his wound. In fact the case has been since we did the prednisone on May 2 for [name] that we have noticed a little bit of improvement each time we have seen a size wise as well as appearance wise as well as pain wise. I think the prednisone has had a greater effect then the infusion therapy has to be perfectly honest. With  that being said the patient also feels significantly better compared to what [name] was previous. All of this is good news but  nonetheless I am still concerned about the fact that again we are really not set up to long-term manage [name] as far as prednisone is concerned. Obviously there are things that you need to be watched I completely understand the risk of prednisone usage as well. 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 With all that being said I do believe that we can give [name] another round of the prednisone which she is requesting today because of the improvement that [name] seen since we did that first round. 10/30/2020 upon evaluation today patient's wound actually is showing signs of doing quite well. There does not appear to be any evidence of infection which is great news and overall very pleased with where things stand today. No fevers, chills, nausea, vomiting, or diarrhea. [name] tells me that the prednisone still has seem to have helped [name] wonders if we can extend that for just a little bit longer. [name] did not have the appointment with a dermatologist although [name] did have an infusion appointment last Friday. That was at [facility]. With that being said [name] tells me [name] could not do both that as well as the appointment with the physician on the same day therefore that is can have to be rescheduled. I really want to see if there is anything they feel like that could be done differently to try to help this out as I am not really certain that the infusions are helping significantly here. 11/13/2020 upon evaluation today patient unfortunately appears to be doing somewhat poorly in regard to his wound I feel like this is actually worsening from the standpoint of the pyoderma spreading. I still feel like that [name] may need something different as far as trying to manage this going forward. Again we did the prednisone unfortunately his blood sugars are not doing so well  because of this. Nonetheless I believe that the patient likely needs to try topical steroid. We have done triamcinolone for a while I think going with something stronger such as clobetasol could be beneficial again this is not something I do lightly I discussed this with the patient that again this does not normally put underneath an occlusive dressing. Nonetheless I think a thin film as such could help with some of the stronger anti-inflammatory effects. We discussed this today. [name] would like to try to give this a trial for the next couple weeks. I definitely think that is something that we can do. Evaluate7/03/2021 and today patient's wound bed actually showed signs of doing really about the same. There was a little expansion of the size of the wound and that leading edge that we done looking out although the clobetasol does seem to have slowed this down a bit in my opinion. There is just 1 small area that still seems to be progressing based on what I see. Nonetheless I am concerned about the fact this does not seem to be improving if anything seems to be doing a little bit worse. I do not know that the infusions are really helping [name] much as next infusion is August 5 his appointment with dermatology is July 25. Either way I really think that we need to have a conversation potentially about this and I am actually going to see if I can talk with Dr. Lillia Carmel in order to see where things stand as well. 12/11/2020 upon evaluation today patient appears to be doing worse in regard to his leg ulcer. Unfortunately  I just do not think this is making the progress that I would like to see at this point. Honestly [name] does have an appointment with dermatology and this is in 2 days. I am wondering what they may have to offer to help with this. Right now what I am seeing is that [name] is continuing to show signs of worsening little by little. Obviously that is not great at all. Is the exact opposite of what we are  looking for. 12/18/2020 upon evaluation today patient appears to be doing a little better in regard to his wound. The dermatologist actually did do some steroid injections into the wound which does seem to have been beneficial in my opinion. That was on the 25th already this looks a little better to me than last time I saw [name]. With that being said we did do a culture and this did show that [name] has Staph aureus noted in abundance in the wound. With that being said I do think that getting [name] on an oral antibiotic would be appropriate as well. Also think we can compression wrap and this will make a difference as well. 12/28/2020 upon evaluation today patient's wound is actually showing signs of doing much better. I do believe the compression wrap is helping [name] has a lot of drainage but to be honest I think that the compression is helping to some degree in this regard as well as not draining through which is also good news. No fevers, chills, nausea, vomiting, or diarrhea. Electronic Signature(s) Signed: 12/28/2020 9:10:51 AM By: Worthy Keeler PA-C Entered By: Worthy Keeler on 12/28/2020 09:10:51 DARVIS, CROFT ([id number]) -------------------------------------------------------------------------------- Physical Exam Details Patient Name: [name] Kerns Date of Service: 12/28/2020 8:15 AM Medical Record Number: [id number] Patient Account Number: [id number] Date of Birth/Sex: 02-20-1981 (39 y.o. M) Treating RN: Dolan Amen Primary Care Provider: [name] Other Clinician: Referring Provider: [name] Treating Provider/Extender: [name] Cliche in Treatment: 212 Constitutional Well-nourished and well-hydrated in no acute distress. Respiratory normal breathing without difficulty. Psychiatric this patient is able to make decisions and demonstrates good insight into disease process. Alert and Oriented x 3. pleasant and cooperative. Notes Upon inspection patient's wound bed showed  signs of good granulation and epithelization at this point. There does not appear to be any evidence of active infection which is great news and overall I am extremely pleased with where things stand today. I do believe we may want to continue the antibiotic for a bit longer just to make sure that everything completely clears. Electronic Signature(s) Signed: 12/28/2020 9:11:12 AM By: Worthy Keeler PA-C Entered By: Worthy Keeler on 12/28/2020 09:11:12 EISA, NECAISE ([id number]) -------------------------------------------------------------------------------- Physician Orders Details Patient Name: [name] Kerns Date of Service: 12/28/2020 8:15 AM Medical Record Number: [id number] Patient Account Number: [id number] Date of Birth/Sex: May 17, 1981 (39 y.o. 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 - Next Thursday o Nurse Visit as needed - Monday Bathing/ Shower/ Hygiene o Clean wound with Normal Saline or wound cleanser. Edema Control - Lymphedema /  Segmental Compressive Device / Other o Optional: One layer of unna paste to top of compression wrap (to act as an anchor). o 4 Layer Compression System Lymphedema. o Other: - Call [facility] for knee high compression socks 20/74m/Hg Medications-Please add to medication list. o P.O. Antibiotics Wound Treatment Wound  #1 - Lower Leg Wound Laterality: Left, Lateral Cleanser: Soap and Water 2 x Per Week/30 Days Discharge Instructions: Gently cleanse wound with antibacterial soap, rinse and pat dry prior to dressing wounds Topical: Triamcinolone Acetonide Cream, 0.1%, 15 (g) tube 2 x Per Week/30 Days Discharge Instructions: Apply in office only to 7 o'clock area Primary Dressing: Aquacel Extra Hydrofiber Dressing, 4x5 (in/in) 2 x Per Week/30 Days Discharge Instructions: Apply on wound bed Primary Dressing: CarboFLEX Odor Control Dressing, 4x4 (in/in) 2 x Per Week/30 Days Discharge Instructions: Apply over gauze Secondary Dressing: Gauze 2 x Per Week/30 Days Discharge Instructions: Cover alginate with dry gauze Secondary Dressing: Xtrasorb Medium 4x5 (in/in) 2 x Per Week/30 Days Discharge Instructions: Apply to wound as directed. Do not cut. Compression Wrap: Medichoice 4 layer Compression System, 35-40 mmHG (Generic) 2 x Per Week/30 Days Discharge Instructions: Apply multi-layer wrap as directed. Electronic Signature(s) Signed: 12/28/2020 4:42:11 PM By: SDolan AmenRN Signed: 12/28/2020 5:38:02 PM By: SIrean HongLSpencer Nishaan ([id number] Entered By: SDolan Amenon 12/28/2020 09:28:19 LLETRELL, ATTWOOD([id number] -------------------------------------------------------------------------------- Problem List Details Patient Name: LHaynes KernsDate of Service: 12/28/2020 8:15 AM Medical Record Number: [id number] Account Number: [id number] of Birth/Sex: 912-Apr-1982(40y.o. M) Treating RN: SDolan AmenPrimary Care Provider: CAlma FriendlyOther Clinician: Referring Provider: CAlma FriendlyTreating Provider/Extender: SSkipper Clichein Treatment: 212 Active Problems ICD-10 Encounter Code Description Active Date MDM Diagnosis L97.222 Non-pressure chronic ulcer of left calf with fat layer exposed 12/04/2016 No Yes L88 Pyoderma gangrenosum 03/26/2017 No Yes L97.321  Non-pressure chronic ulcer of left ankle limited to breakdown of skin 10/08/2019 No Yes I87.2 Venous insufficiency (chronic) (peripheral) 12/04/2016 No Yes L03.116 Cellulitis of left lower limb 05/24/2019 No Yes F17.208 Nicotine dependence, unspecified, with other nicotine-induced disorders 04/24/2020 No Yes Inactive Problems ICD-10 Code Description Active Date Inactive Date L97.213 Non-pressure chronic ulcer of right calf with necrosis of muscle 04/02/2017 04/02/2017 Resolved Problems Electronic Signature(s) Signed: 12/28/2020 8:50:48 AM By: SWorthy KeelerPA-C Entered By: SWorthy Keeleron 12/28/2020 08:50:48 Capek, RHerbie Baltimore([id number] -------------------------------------------------------------------------------- Progress Note Details Patient Name: LHaynes KernsDate of Service: 12/28/2020 8:15 AM Medical Record Number: [id number] Account Number: [id number] of Birth/Sex: 9Dec 15, 1982(39 y.o. M) Treating RN: SDolan AmenPrimary Care Provider: CAlma FriendlyOther Clinician: Referring Provider: CAlma FriendlyTreating Provider/Extender: SSkipper Clichein Treatment: 212 Subjective Chief Complaint Information obtained from Patient [name] is here in follow up evaluation for LLE pyoderma ulcer History of Present Illness (HPI) 12/04/16; 40year old man who comes into the clinic today for review of a wound on the posterior left calf. [name] tells me that is been there for about a year. [name] is not a diabetic [name] does smoke half a pack per day. [name] was seen in the ER on 11/20/16 felt to have cellulitis around the wound and was given clindamycin. An x-ray did not show osteomyelitis. 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 [name] has been using various topical antibiotics and ointments without any  effect. [name] works in a [employer] and is  on his feet all day. [name] does not have a prior history of wounds however [name] does have the rash on both lower legs the right arm and the ventral aspect of his left arm. These are excoriations and clearly have had scratching however there are of macular looking areas on both legs including a substantial larger area on the right leg. This does not have an underlying open area. There is no blistering. 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 [name] seemed to recognize this. On further questioning [name] admits to a 5 year history of quiesced. ulcerative colitis. [name] is not in any treatment for this. [name]'s had no recent travel 12/11/16; the patient arrives today with his wound and roughly the same condition we've been using silver alginate this is a deep punched out wound with some surrounding erythema but no tenderness. Biopsy I did did not show confirmed pyoderma gangrenosum suggested nonspecific inflammation and vasculitis but does not provide an actual description of what was seen by the pathologist. I'm really not able to understand this We have also received information from the patient's dermatologist in [place] notes from April 2016. This was a doctor Agarwal-antal. 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 [name] was prescribed topical steroid high potency under occlusion which helped but at this point the patient did not have a deep punched out wound. 12/18/16; the patient's wound is larger in terms of surface area however this surface looks better and there is less depth. The surrounding erythema also is better. The patient states that the wrap we put on came off 2 days ago when [name] has been using his compression stockings. [name] we are in the process of getting a dermatology consult. 12/26/16 on evaluation today patient's left  lower extremity wound shows evidence of infection with surrounding erythema noted. [name] has been tolerating the dressing changes but states that [name] has noted more discomfort. There is a larger area of erythema surrounding the wound. No fevers, chills, nausea, or vomiting noted at this time. With that being said the wound still does have slough covering the surface. [name] is not allergic to any medication that [name] is aware of at this point. In regard to his right lower extremity [name] had several regions that are erythematous and pruritic [name] wonders if there's anything we can do to help that. 01/02/17 I reviewed patient's wound culture which was obtained his visit last week. [name] was placed on doxycycline at that point. Unfortunately that does not appear to be an antibiotic that would likely help with the situation however the pseudomonas noted on culture is sensitive to Cipro. Also unfortunately patient's wound seems to have a large compared to last week's evaluation. Not severely so but there are definitely increased measurements in general. [name] is continuing to have discomfort as well [name] writes this to be a seven out of 10. In fact [name] would prefer me not to perform any debridement today due to the fact that [name] is having discomfort and considering [name] has an active infection on the little reluctant to do so anyway. No fevers, chills, nausea, or vomiting noted at this time. 01/08/17; patient seems dermatology on September 5. I suspect dermatology will want the slides from the  biopsy I did sent to their pathologist. I'm not sure if there is a way we can expedite that. In any case the culture I did before I left on vacation 3 weeks ago showed Pseudomonas [name] was given 10 days of Cipro and per her description of her intake nurses is actually somewhat better this week although the wound is quite a bit bigger than I remember the last time I saw this. [name] still has 3 more days of Cipro 01/21/17; dermatology appointment  tomorrow. [name] has completed the ciprofloxacin for Pseudomonas. Surface of the wound looks better however [name] is had some deterioration in the lesions on his right leg. Meantime the left lateral leg wound we will continue with sample 01/29/17; patient had his dermatology appointment but I can't yet see that note. [name] is completed his antibiotics. The wound is more superficial but considerably larger in circumferential area than when [name] came in. This is in his left lateral calf. [name] also has swollen erythematous areas with superficial wounds on the right leg and small papular areas on both arms. There apparently areas in her his upper thighs and buttocks I did not look at those. Dermatology biopsied the right leg. Hopefully will have their input next week. 02/05/17; patient went back to see his dermatologist who told [name] that [name] had a "scratching problem" as well as staph. [name] is now on a 30 day course of doxycycline and I believe she gave [name] triamcinolone cream to the right leg areas to help with the itching [not exactly sure but probably triamcinolone]. She apparently looked at the left lateral leg wound although this was not rebiopsied and I think felt to be ultimately part of the same pathogenesis. [name] is using sample border foam and changing nevus himself. [name] now has a new open area on the right posterior leg which was his biopsy site I don't have any of the dermatology notes 02/12/17; we put the patient in compression last week with SANTYL to the wound on the left leg and the biopsy. Edema is much better and the depth of the wound is now at level of skin. Area is still the same Biopsy site on the right lateral leg we've also been using santyl with a border foam dressing and [name] is changing this himself. 02/19/17; Using silver alginate started last week to both the substantial left leg wound and the biopsy site on the right wound. [name] is tolerating compression well. Has a an appointment with his primary M.D.  tomorrow wondering about diuretics although I'm wondering if the edema problem is actually lymphedema EISSA, BUCHBERGER ([id number]) 02/26/17; the patient has been to see his primary doctor Dr. Jerrel Ivory at [facility] our primary care. She started [name] on Lasix 20 mg and this seems to have helped with the edema. However we are not making substantial change with the left lateral calf wound and inflammation. The biopsy site on the right leg also looks stable but not really all that different. 03/12/17; the patient has been to see vein and vascular Dr. Lucky Cowboy. [name] has had venous reflux studies I have not reviewed these. I did get a call from his dermatology office. They felt that [name] might have pathergy based on their biopsy on his right leg which led them to look at the slides of the biopsy I did on the left leg and they wonder whether this represents pyoderma gangrenosum which was the original supposition in a man with ulcerative colitis albeit inactive for many years.  They therefore recommended clobetasol and tetracycline i.e. 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 03/26/17; apparently the patient just had reflux studies not an appointment with Dr. dew. She arrives in clinic today having applied clobetasol for 2-3 weeks. [name] notes over the last 2-3 days excessive drainage having to change the dressing 3-4 times a day and also expanding erythema. [name] states the expanding erythema seems to come and go and was last this red was earlier in the month.[name] is on doxycycline 150 mg twice a day as an anti-inflammatory systemic therapy for possible pyoderma gangrenosum along with the topical clobetasol 04/02/17; the patient was seen last week by Dr. Lillia Carmel at [facility] dermatology locally who kindly saw [name] at my request. 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 My concern was the degree of erythema medially extending from his left leg wound  which was either inflammation from pyoderma or cellulitis. I put [name] on Augmentin however culture of the wound showed Pseudomonas which is quinolone sensitive. I really don't believe [name] has cellulitis however in view of everything I will continue and give [name] a course of Cipro. [name] is also on doxycycline as an immune modulator for the pyoderma. In addition to his original wound on the left lateral leg with surrounding erythema [name] has a wound on the right posterior calf which was an original biopsy site done by dermatology. 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 Saw Dr. Lillia Carmel yesterday. [name] has been using topical antibiotics to both wound areas his original wound on the left and the biopsies/pathergy area on the right. There is definitely some improvement in the inflammation around the wound on the right although the patient states [name] has increasing sensitivity of the wounds. [name] is on prednisone 60 and doxycycline 1 as prescribed by Dr. Lillia Carmel. [name] is covering the topical antibiotic with gauze and putting this in his own compression stocks and changing this daily. [name] states that Dr. Lottie Rater did a culture of the left leg wound yesterday 05/07/17; pyoderma gangrenosum. The patient saw Dr. Lillia Carmel yesterday and has a follow-up with her in one month. [name] is still using topical antibiotics to both wounds although [name] can't recall exactly what type. [name] is still on prednisone 60 mg. Dr. Lillia Carmel stated that the doxycycline could stop if we were in agreement. 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 The right medial leg was induced by biopsy/pathergy. The area on the right is essentially healed. 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 The right  medial leg has closed and remains closed. [name] is still on prednisone 60. [name] tells me [name] missed his last dermatology appointment with Dr. Lillia Carmel but will make another appointment. [name] reports that her blood sugar at a recent screen in [place] was high 200's. [name] was 180 today. [name] is more cushingoid blood pressure is up a bit. I think [name] is going to require still much longer prednisone perhaps another 3 months before attempting to taper. In the meantime his wound is a lot better. Smaller. [name] is cleaning this off daily and applying topical antibiotics. When [name] was last in the clinic I thought about changing to [facility] and actually put in a couple of calls to dermatology although probably not during their business hours.  In any case the wound looks better smaller I don't think there is any need to change what [name] is doing 08/06/17-[name] is here in follow up evaluation for pyoderma left leg ulcer. [name] continues on oral prednisone. [name] has been using triple antibiotic ointment. There is surface debris and we will transition to [facility] and have [name] return in 2 weeks. [name] has lost 30 pounds since his last appointment with lifestyle modification. [name] may benefit from topical steroid cream for treatment this can be considered at a later date. 08/22/17 on evaluation today patient appears to actually be doing rather well in regard to his left lateral lower extremity ulcer. [name] has actually been managed by Dr. Dellia Nims most recently. Patient is currently on oral steroids at this time. This seems to have been of benefit for [name]. Nonetheless his last visit was actually with Leah on 08/06/17. Currently [name] is not utilizing any topical steroid creams although this could be of benefit as well. No fevers, chills, nausea, or vomiting noted at this time. 09/05/17 on evaluation today patient appears to be doing better in regard to his left lateral lower extremity ulcer. [name] has been tolerating the dressing changes without complication. [name]  is using Santyl with good effect. Overall I'm very pleased with how things are standing at this point. Patient likewise is happy that this is doing better. 09/19/17 on evaluation today patient actually appears to be doing rather well in regard to his left lateral lower extremity ulcer. Again this is secondary to Pyoderma gangrenosum and [name] seems to be progressing well with the Santyl which is good news. [name]'s not having any significant pain. 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 [name] has been tolerating the Santyl without complication and in general I feel like [name]'s making good progress. 10/17/17 on evaluation today patient appears to be doing very well in regard to his left lateral lower surety ulcer. [name] has been tolerating the dressing changes without complication. There does not appear to be any evidence of infection [name]'s alternating the Santyl and the triple antibiotic ointment every other day this seems to be doing well for [name]. 11/03/17 on evaluation today patient appears to be doing very well in regard to his left lateral lower extremity ulcer. [name] is been tolerating the dressing changes without complication which is good news. Fortunately there does not appear to be any evidence of infection which is also great news. Overall is doing excellent they are starting to taper down on the prednisone is down to 40 mg at this point it also started topical clobetasol for [name]. 11/17/17 on evaluation today patient appears to be doing well in regard to his left lateral lower surety ulcer. [name]'s been tolerating the dressing changes without complication. [name] does note that [name] is having no pain, no excessive drainage or discharge, and overall [name] feels like things are going about how [name] would expect and hope they would. Overall [name] seems to have no evidence of infection at this time in my opinion which is  good news. 12/04/17-[name] is seen in follow-up evaluation for right lateral lower extremity ulcer. [name] has been applying topical steroid cream. Today's measurement show slight increase in size. Over the next 2 weeks we will transition to every other day Santyl and steroid cream. [name] has been encouraged to monitor for changes and notify clinic with any concerns 12/15/17 on evaluation today patient's left  lateral motion the ulcer and fortunately is doing worse again at this point. This just since last week to this week has close to doubled in size according to the patient. I did not seeing last week's I do not have a visual to compare this to in our system was also down so we do not have all the charts and at this point. Nonetheless it does have me somewhat concerned in regard to the fact that again [name] was worried enough about it [name] has contact the dermatology that placed them back on the full strength, 50 mg a day of the prednisone that [name] was taken previous. [name] continues to alternate using clobetasol along with Santyl at this point. [name] is obviously somewhat frustrated. [name], [name] ([id number]) 12/22/17 on evaluation today patient appears to be doing a little worse compared to last evaluation. Unfortunately the wound is a little deeper and slightly larger than the last week's evaluation. With that being said [name] has made some progress in regard to the irritation surrounding at this time unfortunately despite that progress that's been made [name] still has a significant issue going on here. I'm not certain that [name] is having really any true infection at this time although with the Pyoderma gangrenosum it can sometimes be difficult to differentiate infection versus just inflammation. For that reason I discussed with [name] today the possibility of perform a wound culture to ensure there's nothing overtly infected. 01/06/18 on evaluation today patient's wound is larger and deeper than previously evaluated. With that being  said it did appear that his wound was infected after my last evaluation with [name]. Subsequently I did end up prescribing a prescription for Bactrim DS which she has been taking and having no complication with. Fortunately there does not appear to be any evidence of infection at this point in time as far as anything spreading, no want to touch, and overall I feel like things are showing signs of improvement. 01/13/18 on evaluation today patient appears to be even a little larger and deeper than last time. There still muscle exposed in the base of the wound. Nonetheless [name] does appear to be less erythematous I do believe inflammation is calming down also believe the infection looks like it's probably resolved at this time based on what I'm seeing. No fevers, chills, nausea, or vomiting noted at this time. 01/30/18 on evaluation today patient actually appears to visually look better for the most part. Unfortunately those visually this looks better [name] does seem to potentially have what may be an abscess in the muscle that has been noted in the central portion of the wound. This is the first time that I have noted what appears to be fluctuance in the central portion of the muscle. With that being said I'm somewhat more concerned about the fact that this might indicate an abscess formation at this location. I do believe that an ultrasound would be appropriate. This is likely something we need to try to do as soon as possible. [name] has been switch to mupirocin ointment and [name] is no longer using the steroid ointment as prescribed by dermatology [name] sees them again next week [name]'s been decreased from 60 to 40 mg of prednisone. 03/09/18 on evaluation today patient actually appears to be doing a little better compared to last time I saw [name]. There's not as much erythema surrounding the wound itself. [name] I did review his most recent infectious disease note which was dated 02/24/18. [name] saw Dr. Michel Bickers in  [place].  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 Patient is now off of antibiotics and they are continually observing this. There seems to be no change in the past two weeks in my pinion based on what the patient says and what I see today compared to what Dr. Megan Salon likely saw two weeks ago. No fevers, chills, nausea, or vomiting noted at this time. 03/23/18 on evaluation today patient's wound actually appears to be showing signs of improvement which is good news. [name] is currently still on the Dapsone. [name] is also working on tapering the prednisone to get off of this and Dr. Lottie Rater is working with [name] in this regard. Nonetheless overall I feel like the wound is doing well it does appear based on the infectious disease note that I reviewed from Dr. 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 I did also review the results of his ultrasound of left lower extremity which revealed there was a dentist tissue in the base of the wound without an abscess noted. 04/06/18 on evaluation today the patient's left lateral lower extremity ulcer actually appears to be doing fairly well which is excellent news. There does not appear to be any evidence of infection at this time which is also great news. Overall [name] still does have a significantly large ulceration although little by little [name] seems to be making progress. [name] is down to 10 mg a day of the prednisone. 04/20/18 on evaluation today patient actually appears to be doing excellent at this time in regard to his left lower extremity ulcer. [name]'s making signs of good progress unfortunately this is taking much longer than we would really like to see but nonetheless [name] is making progress. Fortunately there does not appear to be any evidence of infection at this time. No  fevers, chills, nausea, or vomiting noted at this time. The patient has not been using the Santyl due to the cost [name] hadn't got in this field yet. [name]'s mainly been using the antibiotic ointment topically. Subsequently [name] also tells me that [name] really has not been scrubbing in the shower I think this would be helpful again as I told [name] it doesn't have to be anything too aggressive to even make it believe just enough to keep it free of some of the loose slough and biofilm on the wound surface. 05/11/18 on evaluation today patient's wound appears to be making slow but sure progress in regard to the left lateral lower extremity ulcer. [name] is been tolerating the dressing changes without complication. Fortunately there does not appear to be any evidence of infection at this time. [name] is still just using triple antibiotic ointment along with clobetasol occasionally over the area. [name] never got the Santyl and really does not seem to intend to in my pinion. 06/01/18 on evaluation today patient appears to be doing a little better in regard to his left lateral lower extremity ulcer. [name] states that overall [name] does not feel like [name] is doing as well with the Dapsone as [name] did with the prednisone. Nonetheless [name] sees his dermatologist later today and is gonna talk to them about the possibility of going back on the prednisone. Overall again I believe that the wound would be better if you would utilize [place] but [name]  really does not seem to be interested in going back to the [place] at this point. [name] has been using triple antibiotic ointment. 06/15/18 on evaluation today patient's wound actually appears to be doing about the same at this point. Fortunately there is no signs of infection at this time. [name] has made slight improvements although [name] continues to not really want to clean the wound bed at this point. [name] states that [name] just doesn't mess with it [name] doesn't want to cause any problems with everything else [name] has going  on. [name] has been on medication, antibiotics as prescribed by his dermatologist, for a staff infection of his lower extremities which is really drying out now and looking much better [name] tells me. Fortunately there is no sign of overall infection. 06/29/18 on evaluation today patient appears to be doing well in regard to his left lateral lower surety ulcer all things considering. Fortunately his staff infection seems to be greatly improved compared to previous. [name] has no signs of infection and this is drying up quite nicely. [name] is still the doxycycline for this is no longer on cental, Dapsone, or any of the other medications. His dermatologist has recommended possibility of an infusion but right now [name] does not want to proceed with that. 07/13/18 on evaluation today patient appears to be doing about the same in regard to his left lateral lower surety ulcer. Fortunately there's no signs of infection at this time which is great news. Unfortunately [name] still builds up a significant amount of Slough/biofilm of the surface of the wound [name] still is not really cleaning this as [name] should be appropriately. Again I'm able to easily with saline and gauze remove the majority of this on the surface which if you would do this at home would likely be a dramatic improvement for [name] as far as getting the area to improve. Nonetheless overall I still feel like [name] is making progress is just very slow. I think Santyl will be of benefit for [name] as well. Still [name] has not gotten this as of this point. 07/27/18 on evaluation today patient actually appears to be doing little worse in regards of the erythema around the periwound region of the wound [name] also tells me that [name]'s been having more drainage currently compared to what [name] was experiencing last time I saw [name]. [name] states not quite as bad as what [name] had because this was infected previously but nonetheless is still appears to be doing poorly. Fortunately there is no evidence  of systemic infection at this point. The patient tells me that [name] is not going to be able to afford the Santyl. [name] is still waiting to hear about the infusion therapy with his dermatologist. Apparently she wants an updated colonoscopy first. LEIGH, KAEDING ([id number]) 08/10/18 on evaluation today patient appears to be doing better in regard to his left lateral lower extremity ulcer. Fortunately [name] is showing signs of improvement in this regard [name]'s actually been approved for Remicade infusion's as well although this has not been scheduled as of yet. Fortunately there's no signs of active infection at this time in regard to the wound although [name] is having some issues with infection of the right lower extremity is been seen as dermatologist for this. Fortunately they are definitely still working with [name] trying to keep things under control. 09/07/18 on evaluation today patient is actually doing rather well in regard to his left lateral lower extremity ulcer. [name] notes these actually having some hair  grow back on his extremity which is something [name] has not seen in years. [name] also tells me that the pain is really not giving them any trouble at this time which is also good news overall she is very pleased with the progress [name]'s using a combination of the mupirocin along with the probate is all mixed. 09/21/18 on evaluation today patient actually appears to be doing fairly well all things considered in regard to his looks from the ulcer. [name]'s been tolerating the dressing changes without complication. Fortunately there's no signs of active infection at this time which is good news [name] is still on all antibiotics or prevention of the staff infection. [name] has been on prednisone for time although [name] states it is gonna contact his dermatologist and see if she put them on a short course due to some irritation that [name] has going on currently. Fortunately there's no evidence of any overall worsening this is going very slow  I think cental would be something that would be helpful for [name] although [name] states that $50 for tube is quite expensive. [name] therefore is not willing to get that at this point. 10/06/18 on evaluation today patient actually appears to be doing decently well in regard to his left lateral leg ulcer. [name]'s been tolerating the dressing changes without complication. Fortunately there's no signs of active infection at this time. Overall I'm actually rather pleased with the progress [name]'s making although it's slow [name] doesn't show any signs of infection and [name] does seem to be making some improvement. I do believe that [name] may need a switch up and dressings to try to help this to heal more appropriately and quickly. 10/19/18 on evaluation today patient actually appears to be doing better in regard to his left lateral lower extremity ulcer. This is shown signs of having much less Slough buildup at this point due to the fact [name] has been using the [employer]. Obviously this is very good news. The overall size of the wound is not dramatically smaller but again the appearance is. 11/02/18 on evaluation today patient actually appears to be doing quite well in regard to his lower [place] ulcer. A lot of the skin around the ulcer is actually somewhat irritating at this point this seems to be more due to the dressing causing irritation from the adhesive that anything else. Fortunately there is no signs of active infection at this time. 11/24/18 on evaluation today patient appears to be doing a little worse in regard to his overall appearance of his lower extremity ulcer. There's more erythema and warmth around the wound unfortunately. [name] is currently on doxycycline which [name] has been on for some time. With that being said I'm not sure that seems to be helping with what appears to possibly be an acute cellulitis with regard to his left lower extremity ulcer. No fevers, chills, nausea, or vomiting noted at this time. 12/08/18 on  evaluation today patient's wounds actually appears to be doing significantly better compared to his last evaluation. [name] has been using Santyl along with alternating tripling about appointment as well as the steroid cream seems to be doing quite well and the wound is showing signs of improvement which is excellent news. Fortunately there's no evidence of infection and in fact his culture came back negative with only normal skin flora noted. 12/21/2018 upon evaluation today patient actually appears to be doing excellent with regard to his ulcer. This is actually the best that I have seen it since have  been helping to take care of [name]. It is both smaller as well as less slough noted on the surface of the wound and seems to be showing signs of good improvement with new skin growing from the edges. [name] has been using just the triamcinolone [name] does wonder if [name] can get a refill of that ointment today. 01/04/2019 upon evaluation today patient actually appears to be doing well with regard to his left lateral lower extremity ulcer. With that being said it does not appear to be that [name] is doing quite as well as last time as far as progression is concerned. There does not appear to be any signs of infection or significant irritation which is good news. With that being said I do believe that [name] may benefit from switching to a collagen based dressing based on how clean The wound appears. 01/18/2019 on evaluation today patient actually appears to be doing well with regard to his wound on the left lower extremity. [name] is not made a lot of progress compared to where we were previous but nonetheless does seem to be doing okay at this time which is good news. There is no signs of active infection which is also good news. My only concern currently is I do wish we can get [name] into utilizing the collagen dressing his insurance would not pay for the supplies that we ordered although it appears that [name] may be able to order this  through his supply company that [name] typically utilizes. This is [employer]. Nonetheless [name] did try to order it during the office visit today and it appears this did go through. We will see if [name] can get that it is a different brand but nonetheless [name] has collagen and I do think will be beneficial. 02/01/2019 on evaluation today patient actually appears to be doing a little worse today in regard to the overall size of his wounds. Fortunately there is no signs of active infection at this time. That is visually. Nonetheless when this is happened before it was due to infection. For that reason were somewhat concerned about that this time as well. 02/08/2019 on evaluation today patient unfortunately appears to be doing slightly worse with regard to his wound upon evaluation today. Is measuring a little deeper and a little larger unfortunately. I am not really sure exactly what is causing this to enlarge [name] actually did see his dermatologist she is going to see about initiating Humira for [name]. Subsequently she also did do steroid injections into the wound itself in the periphery. Nonetheless still nonetheless [name] seems to be getting a little bit larger [name] is gone back to just using the steroid cream topically which I think is appropriate. I would say hold off on the collagen for the time being is definitely a good thing to do. Based on the culture results which we finally did get the final result back regarding it shows staph as the bacteria noted again that can be a normal skin bacteria based on the fact however [name] is having increased drainage and worsening of the wound measurement wise I would go ahead and place [name] on an antibiotic today I do believe for this. 02/15/2019 on evaluation today patient actually appears to be doing somewhat better in regard to his ulcer. There is no signs of worsening at this time I did review his culture results which showed evidence of Staphylococcus aureus but not MRSA. Again  this could just be more related to the normal skin bacteria  although [name] states the drainage has slowed down quite a bit [name] may have had a mild infection not just colonization. And was much smaller and then since around10/04/2019 on evaluation today patient appears to be doing unfortunately worse as far as the size of the wound. I really feel like that this is steadily getting larger again it had been doing excellent right at the beginning of September we have seen a steady increase in the area of the wound it is almost 2-1/2 times the size it was on September 1. Obviously this is a bad trend this is not wanting to see. For that reason we went back to using just the topical triamcinolone cream which does seem to help with inflammation. I checked [name] for bacteria by way of culture and nothing showed positive there. I am considering giving [name] a short course of a tapering steroid Elimelech Houseman, Ruffus ([id number]) today to see if that is can be beneficial for [name]. The patient is in agreement with giving that a try. 03/08/2019 on evaluation today patient appears to be doing very well in comparison to last evaluation with regard to his lower extremity ulcer. This is showing signs of less inflammation and actually measuring slightly smaller compared to last time every other week over the past month and a half [name] has been measuring larger larger larger. Nonetheless I do believe that the issue has been inflammation the prednisone does seem to have been beneficial for [name] which is good news. No fevers, chills, nausea, vomiting, or diarrhea. 03/22/2019 on evaluation today patient appears to be doing about the same with regard to his leg ulcer. [name] has been tolerating the dressing changes without complication. With that being said the wound seems to be mostly arrested at its current size but really is not making any progress except for when we prescribed the prednisone. [name] did show some signs of dropping as far as  the overall size of the wound during that interval week. Nonetheless this is something [name] is not on long-term at this point and unfortunately I think [name] is getting need either this or else the Humira which his dermatologist has discussed try to get approval for. With that being said [name] will be seeing his dermatologist on the 11th of this month that is November. 04/19/2019 on evaluation today patient appears to be doing really about the same the wound is measuring slightly larger compared to last time I saw [name]. [name] has not been into the office since November 2 due to the fact that [name] unfortunately had Covid as that his entire family. [name] tells me that it was rough but they did pull-through and [name] seems to be doing much better. Fortunately there is no signs of active infection at this time. No fevers, chills, nausea, vomiting, or diarrhea. 05/10/2019 on evaluation today patient unfortunately appears to be doing significantly worse as compared to last time I saw [name]. [name] does tell me that [name] has had his first dose of Humira and actually is scheduled to get the next one in the upcoming week. With that being said [name] tells me also that in the past several days [name] has been having a lot of issues with green drainage she showed me a picture this is more blue-green in color. [name] is also been having issues with increased sloughy buildup and the wound does appear to be larger today. Obviously this is not the direction that we want everything to take based on the starting of  his Humira. Nonetheless I think this is definitely a result of likely infection and to be honest I think this is probably Pseudomonas causing the infection based on what I am seeing. 05/24/2019 on evaluation today patient unfortunately appears to be doing significantly worse compared to his prior evaluation with me 2 weeks ago. I did review his culture results which showed that [name] does have Staph aureus as well as Pseudomonas noted on the culture.  Nonetheless the Levaquin that I prescribed for [name] does not appear to have been appropriate and in fact [name] tells me [name] is no longer experiencing the green drainage and discharge that [name] had at the last visit. Fortunately there is no signs of active infection at this time which is good news although the wound has significantly worsened it in fact is much deeper than it was previous. We have been utilizing up to this point triamcinolone ointment as the prescription topical of choice but at this time I really feel like that the wound is getting need to be packed in order to appropriately manage this due to the deeper nature of the wound. Therefore something along the lines of an alginate dressing may be more appropriate. 05/31/2019 upon inspection today patient's wound actually showed signs of doing poorly at this point. Unfortunately [name] just does not seem to be making any good progress despite what we have tried. [name] actually did go ahead and pick up the Cipro and start taking that as [name] was noticing more green drainage [name] had previously completed the Levaquin that I prescribed for [name] as well. Nonetheless [name] missed his appointment for the seventh last week on Wednesday with the wound care center and [facility] where his dermatologist referred [name]. Obviously I do think a second opinion would be helpful at this point especially in light of the fact that the patient seems to be doing so poorly despite the fact that we have tried everything that I really know how at this point. The only thing that ever seems to have helped [name] in the past is when [name] was on high doses of continual steroids that did seem to make a difference for [name]. Right now [name] is on immune modulating medication to try to help with the pyoderma but I am not sure that [name] is getting as much relief at this point as [name] is previously obtained from the use of steroids. 06/07/2019 upon evaluation today patient unfortunately appears to be doing  worse yet again with regard to his wound. In fact I am starting to question whether or not [name] may have a fluid pocket in the muscle at this point based on the bulging and the soft appearance to the central portion of the muscle area. There is not anything draining from the muscle itself at this time which is good news but nonetheless the wound is expanding. I am not really seeing any results of the Humira as far as overall wound progression based on what I am seeing at this point. The patient has been referred for second opinion with regard to his wound to the [facility] wound care center by his dermatologist which I definitely am not in opposition to. Unfortunately we tried multiple dressings in the past including collagen, alginate, and at one point even Hydrofera Blue. With that being said [name] is never really used it for any significant amount of time due to the fact that [name] often complains of pain associated with these dressings and then will go  back to either using the [facility] which she has done intermittently or more frequently the triamcinolone. [name] is also using his own compression stockings. We have wrapped [name] in the past but again that was something else that [name] really was not a big fan of. 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 [name] seemed to do much better when [name] was on the steroids although honestly I understand that the steroids are not necessarily the best medication to be on long-term obviously 06/14/2019 on evaluation today patient appears to be doing actually a little bit better with regard to the overall appearance with his leg. Unfortunately [name] does continue to have  issues with what appears to be some fluid underneath the muscle although [name] did see the wound specialty center at [facility] last week their main goals were to see about infusion therapy in place of the Humira as they feel like that is not quite strong enough. They also recommended that we continue with the treatment otherwise as we are they felt like that was appropriate and they are okay with [name] continuing to follow-up here with [place] in that regard. With that being said they are also sending [name] to the vein specialist there to see about vein stripping and if that would be of benefit for [name]. Subsequently they also did not really address whether or not an ultrasound of the muscle area to see if there is anything that needs to be addressed here would be appropriate or not. For that reason I discussed this with [name] last week I think we may proceed down that road at this point. 06/21/2019 upon evaluation today patient's wound actually appears to be doing slightly better compared to previous evaluations. I do believe that [name] has made a difference with regard to the progression here with the use of oral steroids. Again in the past has been the only thing that is really calm things down. [name] does tell me that from [facility] is gotten a good news from there that there are no further vein stripping that is necessary at this point. I do not have that available for review today although the patient did relay this to me. [name] also did obtain and have the ultrasound of the wound completed which I did sign off on today. It does appear that there is no fluid collection under the muscle this is likely then just edematous tissue in general. That is also good news. Overall I still believe the inflammation is the main issue here. [name] did inquire about the possibility of a wound VAC again with the muscle protruding like it is I am not really sure whether the wound VAC is necessarily ideal or not. That is something we will have to consider  although I do believe [name] may need compression wrapping to try to help with edema control which could potentially be of benefit. 06/28/2019 on evaluation today patient appears to be doing slightly better measurement wise although this is not terribly smaller [name] least seems to be trending towards that direction. With that being said [name] still seems to have purulent drainage noted in the wound bed at this time.  [name] has been on Levaquin followed by Cipro over the past month. Unfortunately [name] still seems to have some issues with active infection at this time. I did perform a culture last week in order to evaluate and see if indeed there was still anything going on. Subsequently the culture did come back Zhang, Edmund ([id number]) showing Pseudomonas which is consistent with the drainage has been having which is blue-green in color. [name] also has had an odor that again was somewhat consistent with Pseudomonas as well. Long story short it appears that the culture showed an intermediate finding with regard to how well the Cipro will work for the Pseudomonas infection. Subsequently being that [name] does not seem to be clearing up and at best what we are doing is just keeping this at [place] I think [name] may need to see infectious disease to discuss IV antibiotic options. 07/05/2019 upon evaluation today patient appears to be doing okay in regard to his leg ulcer. [name] has been tolerating the dressing changes at this point without complication. Fortunately there is no signs of active infection at this time which is good news. No fevers, chills, nausea, vomiting, or diarrhea. With that being said [name] does have an appointment with infectious disease tomorrow and his primary care on Wednesday. Again the reason for the infectious disease referral was due to the fact that [name] did not seem to be fully resolving with the use of oral antibiotics and therefore we were thinking that IV antibiotic therapy may be necessary secondary to the  fact that there was an intermediate finding for how effective the Cipro may be. Nonetheless again [name] has been having a lot of purulent and even green drainage. Fortunately right now that seems to have calmed down over the past week with the reinitiation of the oral antibiotic. Nonetheless we will see what Dr. Megan Salon has to say. 07/12/2019 upon evaluation today patient appears to be doing about the same at this point in regard to his left lower extremity ulcer. Fortunately there is no signs of active infection at this time which is good news I do believe the Levaquin has been beneficial I did review Dr. Hale Bogus note and to be honest I agree that the patient's leg does appear to be doing better currently. What we found in the past as [name] does not seem to really completely resolve [name] will stop the antibiotic and then subsequently things will revert back to having issues with blue-green drainage, increased pain, and overall worsening in general. Obviously that is the reason I sent [name] back to infectious disease. 07/19/2019 upon evaluation today patient appears to be doing roughly the same in size there is really no dramatic improvement. [name] has started back on the Levaquin at this point and though [name] seems to be doing okay [name] did still have a lot of blue/green drainage noted on evaluation today unfortunately. I think that this is still indicative more likely of a Pseudomonas infection as previously noted and again [name] does see Dr. Megan Salon in just a couple of days. I do not know that were really able to effectively clear this with just oral antibiotics alone based on what I am seeing currently. Nonetheless we are still continue to try to manage as best we can with regard to the patient and his wound. I do think the wrap was helpful in decreasing the edema which is excellent news. No fevers, chills, nausea, vomiting, or diarrhea. 07/26/2019 upon evaluation today patient appears to be doing slightly  better with  regard to the overall appearance of the muscle there is no dark discoloration centrally. Fortunately there is no signs of active infection at this time. No fevers, chills, nausea, vomiting, or diarrhea. Patient's wound bed currently the patient did have an appointment with Dr. Megan Salon at infectious disease last week. With that being said Dr. Megan Salon the patient states was still somewhat hesitant about put [name] on any IV antibiotics [name] wanted [place] to repeat cultures today and then see where things go going forward. [name] does look like Dr. Megan Salon because of some improvement the patient did have with the Levaquin wanted [place] to see about repeating cultures. If it indeed grows the Pseudomonas again then [name] recommended a possibility of considering a PICC line placement and IV antibiotic therapy. [name] plans to see the patient back in 1 to 2 weeks. 08/02/2019 upon evaluation today patient appears to be doing poorly with regard to his left lower extremity. We did get the results of his culture back it shows that [name] is still showing evidence of Pseudomonas which is consistent with the purulent/blue-green drainage that [name] has currently. Subsequently the culture also shows that [name] now is showing resistance to the oral fluoroquinolones which is unfortunate as that was really the only thing to treat the infection prior. I do believe that [name] is looking like this is going require IV antibiotic therapy to get this under control. Fortunately there is no signs of systemic infection at this time which is good news. The patient does see Dr. Megan Salon tomorrow. 08/09/2019 upon evaluation today patient appears to be doing better with regard to his left lower extremity ulcer in regard to the overall appearance. [name] is currently on IV antibiotic therapy. As ordered by Dr. Megan Salon. Currently the patient is on ceftazidime which she is going to take for the next 2 weeks and then follow-up for 4 to 5-week appointment with Dr. Megan Salon.  The patient started this this past Friday symptoms have not for a total of 3 days currently in full. 08/16/2019 upon evaluation today patient's wound actually does show muscle in the base of the wound but in general does appear to be much better as far as the overall evidence of infection is concerned. In fact I feel like this is for the most part cleared up [name] still on the IV antibiotics [name] has not completed the full course yet but I think [name] is doing much better which is excellent news. 08/23/2019 upon evaluation today patient appears to be doing about the same with regard to his wound at this point. [name] tells me that [name] still has pain unfortunately. Fortunately there is no evidence of systemic infection at this time which is great news. There is significant muscle protrusion. 09/13/19 upon evaluation today patient appears to be doing about the same in regard to his leg unfortunately. [name] still has a lot of drainage coming from the ulceration there is still muscle exposed. With that being said the patient's last wound culture still showed an intermediate finding with regard to the Pseudomonas [name] still having the bluish/green drainage as well. Overall I do not know that the wound has completely cleared of infection at this point. Fortunately there is no signs of active infection systemically at this point which is good news. 09/20/2019 upon evaluation today patient's wound actually appears to be doing about the same based on what I am seeing currently. I do not see any signs of systemic infection [name] still does have evidence  of some local infection and drainage. [name] did see Dr. Megan Salon last week and Dr. Megan Salon states that [name] probably does need a different IV antibiotic although [name] does not want to put [name] on this until the patient begins the Remicade infusion which is actually scheduled for about 10 days out from today on 13 May. Following that time Dr. Megan Salon is good to see [name] back and then will  evaluate the feasibility of starting [name] on the IV antibiotic therapy once again at that point. I do not disagree with this plan I do believe as Dr. Megan Salon stated in his note that I reviewed today that the patient's issue is multifactorial with the pyoderma being 1 aspect of this that were hoping the Remicade will be helpful for her. In the meantime I think the gentamicin is, helping to keep things under decent okay control in regard to the ulcer. 09/27/2019 upon evaluation today patient appears to be doing about the same with regard to his wound still there is a lot of muscle exposure though [name] does have some hyper granulation tissue noted around the edge and actually some granulation tissue starting to form over the muscle which is actually good news. Fortunately there is no evidence of active infection which is also good news. His pain is less at this point. 5/21; this is a patient I have not seen in a long time. 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 [name] has a large wound on the left lateral leg with protruding muscle. [name] comes in the clinic today showing the same area on his left medial ankle. [name] says there is been a spot there for some time although we have not previously defined this. Today [name] has a clearly defined area with slight amount of skin breakdown surrounded by raised areas with a purplish hue in color. This is not painful [name] says it is irritated. 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 [name] still has muscle protruding from the lateral portion of his left leg but fortunately the new area that they were concerned about at his last visit does not appear to have opened at this point. [name] is currently on Remicade infusions and seems to be doing better in my opinion in fact the wound itself seems to be overall much better. The purplish  discoloration that [name] did have seems to have resolved and I think that is a good sign that hopefully the Remicade is doing its job. [name] does Steinke, Effrey ([id number]) have some biofilm noted over the surface of the wound. 11/01/2019 on evaluation today patient's wound actually appears to be doing excellent at this time. Fortunately there is no evidence of active infection and overall I feel like [name] is making great progress. The Remicade seems to be due excellent job in my opinion. 11/08/19 evaluation today vision actually appears to be doing quite well with regard to his weight ulcer. [name]'s been tolerating dressing changes without complication. Fortunately there is no evidence of infection. No fevers, chills, nausea, or vomiting noted at this time. Overall states that is having more itching than pain which is actually a good sign in my opinion. 12/13/2019 upon evaluation today patient appears to be doing well today with regard to his wound. [name] has been tolerating the dressing changes without complication. Fortunately there is no sign of active infection at this time. No fevers, chills, nausea, vomiting,  or diarrhea. Overall I feel like the infusion therapy has been very beneficial for [name]. 01/06/2020 on evaluation today patient appears to be doing well with regard to his wound. This is measuring smaller and actually looks to be doing better. Fortunately there is no signs of active infection at this point. No fevers, chills, nausea, vomiting, or diarrhea. With that being said [name] does still have the blue-green drainage but this does not seem to be causing any significant issues currently. [name] has been using the gentamicin that does seem to be keeping things under decent control at this point. [name] goes later this morning for his next infusion therapy for the pyoderma which seems to also be very beneficial. 02/07/2020 on evaluation today patient appears to be doing about the same in regard to his wounds  currently. Fortunately there is no signs of active infection systemically [name] does still have evidence of local infection still using gentamicin. [name] also is showing some signs of improvement albeit slowly I do feel like we are making some progress here. 02/21/2020 upon evaluation today patient appears to be making some signs of improvement the wound is measuring a little bit smaller which is great news and overall I am very pleased with where [name] stands currently. [name] is going to be having infusion therapy treatment on the 15th of this month. Fortunately there is no signs of active infection at this time. 03/13/2020 I do believe patient's wound is actually showing some signs of improvement here which is great news. [name] has continue with the infusion therapy through rheumatology/dermatology at [facility]. That does seem to be beneficial. I still think [name] gets as much benefit from this as [name] did from the prednisone initially but nonetheless obviously this is less harsh on his body that the prednisone as far as they are concerned. 03/31/2020 on evaluation today patient's wound actually showing signs of some pretty good improvement in regard to the overall appearance of the wound bed. There is still muscle exposed though [name] does have some epithelial growth around the edges of the wound. Fortunately there is no signs of active infection at this time. No fevers, chills, nausea, vomiting, or diarrhea. 04/24/2020 upon evaluation today patient appears to be doing about the same in regard to his leg ulcer. [name] has been tolerating the dressing changes without complication. Fortunately there is no signs of active infection at this time. No fevers, chills, nausea, vomiting, or diarrhea. With that being said [name] still has a lot of irritation from the bandaging around the edges of the wound. We did discuss today the possibility of a referral to plastic surgery. 05/22/2020 on evaluation today patient appears to be doing well with  regard to his wounds all things considered. [name] has not been able to get the Chantix apparently there is a recall nurse that I was unaware of put out by [employer] involuntarily. Nonetheless for now I am and I have to do some research into what may be the best option for [name] to help with quitting in regard to smoking and we discussed that today. 06/26/2020 upon evaluation today patient appears to be doing well with regard to his wound from the standpoint of infection I do not see any signs of infection at this point. With that being said unfortunately [name] is still continuing to have issues with muscle exposure and again [name] is not having a whole lot of new skin growth unfortunately. There does not appear to be any signs of active infection at  this time. No fevers, chills, nausea, vomiting, or diarrhea. 07/10/2020 upon evaluation today patient appears to be doing a little bit more poorly currently compared to where [name] was previous. I am concerned currently about an active infection that may be getting worse especially in light of the increased size and tenderness of the wound bed. No fevers, chills, nausea, vomiting, or diarrhea. 07/24/2020 upon evaluation today patient appears to be doing poorly in regard to his leg ulcer. [name] has been tolerating the dressing changes without complication but unfortunately is having a lot of discomfort. 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 Subsequently I think [name] is going require possibly IV antibiotics to get this under control. I am very concerned about the severity of his infection and the amount of discomfort [name] is having. 07/31/2020 upon evaluation today patient appears to be doing about the same in regard to his leg wound. [name] did see Dr. Megan Salon and Dr. Megan Salon is actually going to start [name] on IV antibiotics. [name] goes for the PICC line tomorrow. With that being said there do not have that run for  2 weeks and then see how things are doing and depending on how [name] is progressing they may extend that a little longer. Nonetheless I am glad this is getting ready to be in place and definitely feel it may help the patient. In the meantime is been using mainly triamcinolone to the wound bed has an anti-inflammatory. 08/07/2020 on evaluation today patient appears to be doing well with regard to his wound compared even last week. In the interim [name] has gotten the PICC line placed and overall this seems to be doing excellent. There does not appear to be any evidence of infection which is great news systemically although locally of course has had the infection this appears to be improving with the use of the antibiotics. 08/14/2020 upon evaluation today patient's wound actually showing signs of excellent improvement. Overall the irritation has significantly improved the drainage is back down to more of a normal level and his pain is really pretty much nonexistent compared to what it was. Obviously I think that this is significantly improved secondary to the IV antibiotic therapy which has made all the difference in the world. Again [name] had a resistant form of Pseudomonas for which oral antibiotics just was not cutting it. Nonetheless I do think that still we need to consider the possibility of a surgical closure for this wound is been open so long and to be honest with muscle exposed I think this can be very hard to get this to close outside of this although definitely were still working to try to do what we can in that regard. 08/21/2020 upon evaluation today patient appears to be doing very well with regard to his wounds on the left lateral lower extremity/calf area. Fortunately there does not appear to be signs of active infection which is great news and overall very pleased with where things stand today. [name] is actually wrapping up his treatment with IV antibiotics tomorrow. After that we will see where  things go from there. 08/28/2020 upon evaluation today patient appears to be doing decently well with regard to his leg ulcer. There does not appear to be any signs of Evett, Kristof ([id number]) active infection which is great news and overall very pleased with where things stand today. No fevers, chills, nausea, vomiting, or diarrhea. 09/18/2020 upon evaluation today patient appears to be doing well  with regard to his infection which I feel like is better. Unfortunately [name] is not doing as well with regard to the overall size of the wound which is not nearly as good at this point. I feel like that [name] may be having an issue here with the pyoderma being somewhat out of control. I think that [name] may benefit from potentially going back and talking to the dermatologist about what to do from the pyoderma standpoint. I am not certain if the infusions are helping nearly as much is what the prednisone did in the past. 10/02/2020 upon evaluation today patient appears to be doing well with regard to his leg ulcer. [name] did go to the Psychiatric nurse. Unfortunately they feel like there is a 10% chance that most that [name] would be able to heal and that the skin graft would take. Obviously this has led [name] to not be able to go down that path as far as treatment is concerned. Nonetheless [name] does seem to be doing a little bit better with the prednisone that I gave [name] last time. I think that [name] may need to discuss with dermatology the possibility of long-term prednisone as that seems to be what is most helpful for [name] to be perfectly honest. I am not sure the Remicade is really doing the job. 10/17/2020 upon evaluation today patient appears to be doing a little better in regard to his wound. In fact the case has been since we did the prednisone on May 2 for [name] that we have noticed a little bit of improvement each time we have seen a size wise as well as appearance wise as well as pain wise. I think the prednisone has had a  greater effect then the infusion therapy has to be perfectly honest. With that being said the patient also feels significantly better compared to what [name] was previous. All of this is good news but nonetheless I am still concerned about the fact that again we are really not set up to long-term manage [name] as far as prednisone is concerned. Obviously there are things that you need to be watched I completely understand the risk of prednisone usage as well. 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 With all that being said I do believe that we can give [name] another round of the prednisone which she is requesting today because of the improvement that [name] seen since we did that first round. 10/30/2020 upon evaluation today patient's wound actually is showing signs of doing quite well. There does not appear to be any evidence of infection which is great news and overall very pleased with where things stand today. No fevers, chills, nausea, vomiting, or diarrhea. [name] tells me that the prednisone still has seem to have helped [name] wonders if we can extend that for just a little bit longer. [name] did not have the appointment with a dermatologist although [name] did have an infusion appointment last Friday. That was at [facility]. With that being said [name] tells me [name] could not do both that as well as the appointment with the physician on the same day therefore that is can have to be rescheduled. I really want to see if there is anything they feel like that could be done differently to try to help this out as I am not really certain that the infusions are helping significantly here. 11/13/2020 upon evaluation today patient unfortunately appears to be doing somewhat poorly  in regard to his wound I feel like this is actually worsening from the standpoint of the pyoderma spreading. I still feel like that [name] may need something different as far as trying to manage this going forward. Again we  did the prednisone unfortunately his blood sugars are not doing so well because of this. Nonetheless I believe that the patient likely needs to try topical steroid. We have done triamcinolone for a while I think going with something stronger such as clobetasol could be beneficial again this is not something I do lightly I discussed this with the patient that again this does not normally put underneath an occlusive dressing. Nonetheless I think a thin film as such could help with some of the stronger anti-inflammatory effects. We discussed this today. [name] would like to try to give this a trial for the next couple weeks. I definitely think that is something that we can do. Evaluate7/03/2021 and today patient's wound bed actually showed signs of doing really about the same. There was a little expansion of the size of the wound and that leading edge that we done looking out although the clobetasol does seem to have slowed this down a bit in my opinion. There is just 1 small area that still seems to be progressing based on what I see. Nonetheless I am concerned about the fact this does not seem to be improving if anything seems to be doing a little bit worse. I do not know that the infusions are really helping [name] much as next infusion is August 5 his appointment with dermatology is July 25. Either way I really think that we need to have a conversation potentially about this and I am actually going to see if I can talk with Dr. Lillia Carmel in order to see where things stand as well. 12/11/2020 upon evaluation today patient appears to be doing worse in regard to his leg ulcer. Unfortunately I just do not think this is making the progress that I would like to see at this point. Honestly [name] does have an appointment with dermatology and this is in 2 days. I am wondering what they may have to offer to help with this. Right now what I am seeing is that [name] is continuing to show signs of worsening little by little.  Obviously that is not great at all. Is the exact opposite of what we are looking for. 12/18/2020 upon evaluation today patient appears to be doing a little better in regard to his wound. The dermatologist actually did do some steroid injections into the wound which does seem to have been beneficial in my opinion. That was on the 25th already this looks a little better to me than last time I saw [name]. With that being said we did do a culture and this did show that [name] has Staph aureus noted in abundance in the wound. With that being said I do think that getting [name] on an oral antibiotic would be appropriate as well. Also think we can compression wrap and this will make a difference as well. 12/28/2020 upon evaluation today patient's wound is actually showing signs of doing much better. I do believe the compression wrap is helping [name] has a lot of drainage but to be honest I think that the compression is helping to some degree in this regard as well as not draining through which is also good news. No fevers, chills, nausea, vomiting, or diarrhea. Objective Constitutional Well-nourished and well-hydrated in no acute distress. Vitals Time  Taken: 8:33 AM, Height: 71 in, Weight: 338 lbs, BMI: 47.1, Temperature: 98.3 F, Pulse: 82 bpm, Respiratory Rate: 18 breaths/min, Blood Pressure: 122/77 mmHg. Lewison, Del ([id number]) Respiratory normal breathing without difficulty. Psychiatric this patient is able to make decisions and demonstrates good insight into disease process. Alert and Oriented x 3. pleasant and cooperative. General Notes: Upon inspection patient's wound bed showed signs of good granulation and epithelization at this point. There does not appear to be any evidence of active infection which is great news and overall I am extremely pleased with where things stand today. I do believe we may want to continue the antibiotic for a bit longer just to make sure that everything completely  clears. Integumentary (Hair, Skin) Wound #1 status is Open. Original cause of wound was Gradually Appeared. The date acquired was: 11/18/2015. The wound has been in treatment 212 weeks. The wound is located on the Left,Lateral Lower Leg. The wound measures 7.1cm length x 6.2cm width x 1.2cm depth; 34.573cm^2 area and 41.488cm^3 volume. There is muscle and Fat Layer (Subcutaneous Tissue) exposed. There is no tunneling noted, however, there is undermining starting at 7:00 and ending at 10:00. There is additional undermining and at 3:00 and ending at 5:00. There is a large amount of purulent drainage noted. There is large (67-100%) red, pink granulation within the wound bed. There is a small (1-33%) amount of necrotic tissue within the wound bed including Adherent Slough. 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 Would recommend currently that we going continue with the wound care measures as before and the patient is in agreement with plan. This includes the use of the plain alginate dressing which I think is actually doing quite well. 2. I am also can recommend that we have the patient continue with a 4-layer compression wrap which I do believe has been beneficial up to this point. 3. I am also can recommend that we have the patient continue with the XtraSorb to cover which I think is doing a good job keeping the drainage under control as well. 4. The patient did advise that there could be increasing his infusions to try to help out with this to avoid any issues with worsening. We will see patient back for reevaluation in 1 week here in the clinic. If anything worsens or changes patient will contact  our office for additional recommendations. Electronic Signature(s) Signed: 12/28/2020 9:12:30 AM By: Worthy Keeler PA-C Entered By: Worthy Keeler on 12/28/2020 09:12:29 [name] Kerns ([id number]) -------------------------------------------------------------------------------- SuperBill Details Patient Name: [name] Kerns Date of Service: 12/28/2020 Medical Record Number: [id number] Patient Account Number: [id number] Date of Birth/Sex: Oct 18, 1980 (40 y.o. M) Treating RN: Dolan Amen Primary Care Provider: [name] Other Clinician: Referring Provider: [name] Treating Provider/Extender: [name] Cliche in Treatment: 212 Diagnosis Coding ICD-10 Codes Code Description [phone] Non-pressure chronic ulcer of left calf with fat layer exposed L88 Pyoderma gangrenosum L97.321 Non-pressure chronic ulcer of left ankle limited to breakdown of skin I87.2 Venous insufficiency (chronic) (peripheral) L03.116 Cellulitis of left lower limb F17.208 Nicotine dependence, unspecified, with other nicotine-induced disorders Facility Procedures CPT4 Code: 58099833 Description: (Facility Use Only) 29581LT - APPLY MULTLAY COMPRS LWR LT LEG Modifier: Quantity: 1 Physician Procedures CPT4 Code: 8250539 Description: 99214 - WC PHYS LEVEL 4 - EST  PT Modifier: Quantity: 1 CPT4 Code: Description: ICD-10 Diagnosis Description L97.222 Non-pressure chronic ulcer of left calf with fat layer exposed L88 Pyoderma gangrenosum L97.321 Non-pressure chronic ulcer of left ankle limited to breakdown of skin I87.2 Venous insufficiency (chronic)  (peripheral) Modifier: Quantity: Electronic Signature(s) Signed: 12/28/2020 4:42:11 PM By: Dolan Amen RN Signed: 12/28/2020 5:38:02 PM By: Worthy Keeler PA-C Previous Signature: 12/28/2020 9:12:43 AM Version By: Worthy Keeler PA-C Entered By: Dolan Amen on 12/28/2020 09:28:46

## 2020-12-28 NOTE — Progress Notes (Signed)
AERON, DONAGHEY ([id number]) Visit Report for 12/28/2020 Arrival Information Details Patient Name: AIMAN, NOE Date of Service: 12/28/2020 8:15 AM Medical Record Number: [id number] Patient Account Number: [id number] Date of Birth/Sex: 1981/04/02 (40 y.o. M) Treating RN: Dolan Amen Primary Care Nafeesa Dils: [name] Other Clinician: Referring Marykathryn Carboni: [name] Treating Jourdan Durbin/Extender: [name] Cliche in Treatment: 212 Visit Information History Since Last Visit Pain Present Now: No Patient Arrived: Ambulatory Arrival Time: 08:32 Accompanied By: self Transfer Assistance: None Patient Identification Verified: Yes Secondary Verification Process Completed: Yes Patient Requires Transmission-Based No Precautions: Patient Has Alerts: Yes Patient Alerts: Patient has reaction to silver dressings. Electronic Signature(s) Signed: 12/28/2020 4:42:11 PM By: Dolan Amen RN Entered By: Dolan Amen on 12/28/2020 08:33:13 [name] ([id number]) -------------------------------------------------------------------------------- Clinic Level of Care Assessment Details Patient Name: JARY, LOUVIER Date of Service: 12/28/2020 8:15 AM Medical Record Number: [id number] Patient Account Number: [id number] Date of Birth/Sex: [birth year] (40 y.o. M) Treating RN: Dolan Amen Primary Care Edynn Gillock: [name] Other Clinician: Referring Keante Urizar: [name] Treating Jariana Shumard/Extender: [name] Cliche in Treatment: 212 Clinic Level of Care Assessment Items TOOL 1 Quantity Score []  - Use when EandM and Procedure is performed on INITIAL visit 0 ASSESSMENTS - Nursing Assessment / Reassessment []  - General Physical Exam (combine w/ comprehensive assessment (listed just below) when performed on new 0 pt. evals) []  - 0 Comprehensive Assessment (HX, ROS, Risk Assessments, Wounds Hx, etc.) ASSESSMENTS - Wound and Skin Assessment / Reassessment []  - Dermatologic / Skin  Assessment (not related to wound area) 0 ASSESSMENTS - Ostomy and/or Continence Assessment and Care []  - Incontinence Assessment and Management 0 []  - 0 Ostomy Care Assessment and Management (repouching, etc.) PROCESS - Coordination of Care []  - Simple Patient / Family Education for ongoing care 0 []  - 0 Complex (extensive) Patient / Family Education for ongoing care []  - 0 Staff obtains Programmer, systems, Records, Test Results / Process Orders []  - 0 Staff telephones HHA, Nursing Homes / Clarify orders / etc []  - 0 Routine Transfer to another Facility (non-emergent condition) []  - 0 Routine Hospital Admission (non-emergent condition) []  - 0 New Admissions / Biomedical engineer / Ordering NPWT, Apligraf, etc. []  - 0 Emergency Hospital Admission (emergent condition) PROCESS - Special Needs []  - Pediatric / Minor Patient Management 0 []  - 0 Isolation Patient Management []  - 0 Hearing / Language / Visual special needs []  - 0 Assessment of Community assistance (transportation, D/C planning, etc.) []  - 0 Additional assistance / Altered mentation []  - 0 Support Surface(s) Assessment (bed, cushion, seat, etc.) INTERVENTIONS - Miscellaneous []  - External ear exam 0 []  - 0 Patient Transfer (multiple staff / Civil Service fast streamer / Similar devices) []  - 0 Simple Staple / Suture removal (25 or less) []  - 0 Complex Staple / Suture removal (26 or more) []  - 0 Hypo/Hyperglycemic Management (do not check if billed separately) []  - 0 Ankle / Brachial Index (ABI) - do not check if billed separately Has the patient been seen at the hospital within the last three years: Yes Total Score: 0 Level Of Care: ____ [name] ([id number]) Electronic Signature(s) Signed: 12/28/2020 4:42:11 PM By: Dolan Amen RN Entered By: Dolan Amen on 12/28/2020 75:17:00 [name] ([id number]) -------------------------------------------------------------------------------- Compression Therapy  Details Patient Name: [name] Date of Service: 12/28/2020 8:15 AM Medical Record Number: [id number] Patient Account Number: [id number] Date of Birth/Sex: 10-14-1980 (40 y.o. M) Treating RN: Dolan Amen Primary Care Hardie Veltre: [name] Other Clinician: Referring Katriana Dortch: [name] Treating  Margret Moat/Extender: Jeri Cos Weeks in Treatment: 212 Compression Therapy Performed for Wound Assessment: Wound #1 Left,Lateral Lower Leg Performed By: Clinician Dolan Amen, RN Compression Type: Four Layer Pre Treatment ABI: 1.2 Post Procedure Diagnosis Same as Pre-procedure Electronic Signature(s) Signed: 12/28/2020 4:42:11 PM By: Dolan Amen RN Entered By: Dolan Amen on 12/28/2020 09:24:42 [name] ([id number]) -------------------------------------------------------------------------------- Encounter Discharge Information Details Patient Name: [name] Date of Service: 12/28/2020 8:15 AM Medical Record Number: [id number] Patient Account Number: [id number] Date of Birth/Sex: [birth year] (41 y.o. M) Treating RN: Dolan Amen Primary Care Harriett Azar: [name] Other Clinician: Referring Saleema Weppler: [name] Treating Dong Nimmons/Extender: [name] Cliche in Treatment: 212 Encounter Discharge Information Items Discharge Condition: Stable Ambulatory Status: Ambulatory Discharge Destination: Home Transportation: Private Auto Accompanied By: self Schedule Follow-up Appointment: Yes Clinical Summary of Care: Electronic Signature(s) Signed: 12/28/2020 4:42:11 PM By: Dolan Amen RN Entered By: Dolan Amen on 12/28/2020 Clifton, Joanthony ([id number]) -------------------------------------------------------------------------------- Lower Extremity Assessment Details Patient Name: [name] Date of Service: 12/28/2020 8:15 AM Medical Record Number: [id number] Patient Account Number: [id number] Date of Birth/Sex: 24-Dec-1980 (39 y.o.  M) Treating RN: Dolan Amen Primary Care Shuntia Exton: [name] Other Clinician: Referring Melquisedec Journey: [name] Treating Zorian Gunderman/Extender: [name] Cliche in Treatment: 212 Edema Assessment Assessed: [Left: Yes] [Right: No] Edema: [Left: Ye] [Right: s] Calf Left: Right: Point of Measurement: 33 cm From Medial Instep 47.5 cm Ankle Left: Right: Point of Measurement: 12 cm From Medial Instep 29 cm Vascular Assessment Pulses: Dorsalis Pedis Palpable: [Left:Yes] Electronic Signature(s) Signed: 12/28/2020 4:42:11 PM By: Dolan Amen RN Entered By: Dolan Amen on 12/28/2020 08:45:48 [name] ([id number]) -------------------------------------------------------------------------------- Multi Wound Chart Details Patient Name: [name] Date of Service: 12/28/2020 8:15 AM Medical Record Number: [id number] Patient Account Number: [id number] Date of Birth/Sex: 10-Feb-1981 (39 y.o. M) Treating RN: Dolan Amen Primary Care Dow Blahnik: [name] Other Clinician: Referring Rayshell Goecke: [name] Treating Kyndall Amero/Extender: [name] Cliche in Treatment: 212 Vital Signs Height(in): 71 Pulse(bpm): 82 Weight(lbs): 338 Blood Pressure(mmHg): 122/77 Body Mass Index(BMI): 47 Temperature(F): 98.3 Respiratory Rate(breaths/min): 18 Photos: [N/A:N/A] Wound Location: Left, Lateral Lower Leg N/A N/A Wounding Event: Gradually Appeared N/A N/A Primary Etiology: Pyoderma N/A N/A Comorbid History: Sleep Apnea, Hypertension, Colitis N/A N/A Date Acquired: 11/18/2015 N/A N/A Weeks of Treatment: 212 N/A N/A Wound Status: Open N/A N/A Measurements L x W x D (cm) 7.1x6.2x1.2 N/A N/A Area (cm) : 34.573 N/A N/A Volume (cm) : 41.488 N/A N/A % Reduction in Area: -604.30% N/A N/A % Reduction in Volume: -956.50% N/A N/A Starting Position 1 (o'clock): Ending Position 1 (o'clock): Undermining: Yes N/A N/A Classification: Full Thickness With Exposed N/A N/A Support  Structures Exudate Amount: Large N/A N/A Exudate Type: Purulent N/A N/A Exudate Color: yellow, brown, green N/A N/A Granulation Amount: Large (67-100%) N/A N/A Granulation Quality: Red, Pink N/A N/A Necrotic Amount: Small (1-33%) N/A N/A Exposed Structures: Fat Layer (Subcutaneous Tissue): N/A N/A Yes Muscle: Yes Fascia: No Tendon: No Joint: No Bone: No Epithelialization: None N/A N/A Treatment Notes Electronic Signature(s) Signed: 12/28/2020 4:42:11 PM By: Dolan Amen RN Entered By: Dolan Amen on 12/28/2020 09:06:41 [name] ([id number]) -------------------------------------------------------------------------------- Multi-Disciplinary Care Plan Details Patient Name: [name] Date of Service: 12/28/2020 8:15 AM Medical Record Number: [id number] Patient Account Number: [id number] Date of Birth/Sex: 11-07-1980 (40 y.o. M) Treating RN: Dolan Amen Primary Care Andelyn Spade: [name] Other Clinician: Referring Sameka Bagent: [name] Treating Catarino Vold/Extender: [name] Cliche in Treatment: 212 oo Multidisciplinary Care Plan reviewed with  physician Active Inactive Electronic Signature(s) Signed: 12/28/2020 4:42:11 PM By: Dolan Amen RN Entered By: Dolan Amen on 12/28/2020 09:06:20 GILDO, CRISCO ([id number]) -------------------------------------------------------------------------------- Pain Assessment Details Patient Name: [name] Date of Service: 12/28/2020 8:15 AM Medical Record Number: [id number] Patient Account Number: [id number] Date of Birth/Sex: 1980-09-15 (39 y.o. M) Treating RN: Dolan Amen Primary Care Vinnie Bobst: [name] Other Clinician: Referring Tirrell Buchberger: [name] Treating Courtnei Ruddell/Extender: [name] Cliche in Treatment: 212 Active Problems Location of Pain Severity and Description of Pain Patient Has Paino No Site Locations Rate the pain. Current Pain Level: 0 Pain Management and  Medication Current Pain Management: Electronic Signature(s) Signed: 12/28/2020 4:42:11 PM By: Dolan Amen RN Entered By: Dolan Amen on 12/28/2020 08:33:46 Heikes, [name] ([id number]) -------------------------------------------------------------------------------- Patient/Caregiver Education Details Patient Name: [name] Date of Service: 12/28/2020 8:15 AM Medical Record Number: [id number] Patient Account Number: [id number] Date of Birth/Gender: June 19, 1980 (39 y.o. M) Treating RN: Dolan Amen Primary Care Physician: [name] Other Clinician: Referring Physician: [name] Treating Physician/Extender: [name] Cliche in Treatment: 212 Education Assessment Education Provided To: Patient Education Topics Provided Wound/Skin Impairment: Methods: Explain/Verbal Responses: State content correctly Electronic Signature(s) Signed: 12/28/2020 4:42:11 PM By: Dolan Amen RN Entered By: Dolan Amen on 12/28/2020 09:29:00 [name] ([id number]) -------------------------------------------------------------------------------- Wound Assessment Details Patient Name: [name] Date of Service: 12/28/2020 8:15 AM Medical Record Number: [id number] Patient Account Number: [id number] Date of Birth/Sex: [birth year] (39 y.o. M) Treating RN: Dolan Amen Primary Care Lennon Boutwell: [name] Other Clinician: Referring Laurianne Floresca: [name] Treating Tamico Mundo/Extender: [name] Cliche in Treatment: 212 Wound Status Wound Number: 1 Primary Etiology: Pyoderma Wound Location: Left, Lateral Lower Leg Wound Status: Open Wounding Event: Gradually Appeared Comorbid History: Sleep Apnea, Hypertension, Colitis Date Acquired: 11/18/2015 Weeks Of Treatment: 212 Clustered Wound: No Photos Wound Measurements Length: (cm) 7.1 Width: (cm) 6.2 Depth: (cm) 1.2 Area: (cm) 34.573 Volume: (cm) 41.488 % Reduction in Area: -604.3% % Reduction in Volume:  -956.5% Epithelialization: None Tunneling: No Undermining: Yes Location 1 Starting Position (o'clock): 7 Ending Position (o'clock): 10 Maximum Distance: (cm) 1 Location 2 Starting Position (o'clock): 3 Ending Position (o'clock): 5 Maximum Distance: (cm) 0.5 Wound Description Classification: Full Thickness With Exposed Support Structures Exudate Amount: Large Exudate Type: Purulent Exudate Color: yellow, brown, green Wound Bed Granulation Amount: Large (67-100%) Exposed Structure Granulation Quality: Red, Pink Fascia Exposed: No Necrotic Amount: Small (1-33%) Fat Layer (Subcutaneous Tissue) Exposed: Yes Necrotic Quality: Adherent Slough Tendon Exposed: No Muscle Exposed: Yes Necrosis of Muscle: No Joint Exposed: No Bone Exposed: No Dansby, Ilir ([id number]) Treatment Notes Wound #1 (Lower Leg) Wound Laterality: Left, Lateral Cleanser Soap and Water Discharge Instruction: Gently cleanse wound with antibacterial soap, rinse and pat dry prior to dressing wounds Peri-Wound Care Topical Triamcinolone Acetonide Cream, 0.1%, 15 (g) tube Discharge Instruction: Apply in office only to 7 o'clock area Primary Dressing Aquacel Extra Hydrofiber Dressing, 4x5 (in/in) Discharge Instruction: Apply on wound bed CarboFLEX Odor Control Dressing, 4x4 (in/in) Discharge Instruction: Apply over gauze Secondary Dressing Gauze Discharge Instruction: Cover alginate with dry gauze Xtrasorb Medium 4x5 (in/in) Discharge Instruction: Apply to wound as directed. Do not cut. Secured With Compression Wrap Medichoice 4 layer Compression System, 35-40 mmHG Discharge Instruction: Apply multi-layer wrap as directed. Compression Stockings Add-Ons Electronic Signature(s) Signed: 12/28/2020 4:42:11 PM By: Dolan Amen RN Entered By: Dolan Amen on 12/28/2020 09:23:56 FLOYD, WADE ([id number]) -------------------------------------------------------------------------------- Vitals  Details Patient Name: [name] Date of Service: 12/28/2020 8:15 AM Medical Record Number:  [id number] Patient Account Number: [id number] Date of Birth/Sex: 1981/01/21 (40 y.o. M) Treating RN: Dolan Amen Primary Care Marge Vandermeulen: [name] Other Clinician: Referring Christabell Loseke: [name] Treating Anarely Nicholls/Extender: [name] Cliche in Treatment: 212 Vital Signs Time Taken: 08:33 Temperature (F): 98.3 Height (in): 71 Pulse (bpm): 82 Weight (lbs): 338 Respiratory Rate (breaths/min): 18 Body Mass Index (BMI): 47.1 Blood Pressure (mmHg): 122/77 Reference Range: 80 - 120 mg / dl Electronic Signature(s) Signed: 12/28/2020 4:42:11 PM By: Dolan Amen RN Entered By: Dolan Amen on 12/28/2020 08:33:30

## 2021-01-01 ENCOUNTER — Other Ambulatory Visit: Payer: Self-pay

## 2021-01-01 DIAGNOSIS — L97222 Non-pressure chronic ulcer of left calf with fat layer exposed: Secondary | ICD-10-CM | POA: Diagnosis not present

## 2021-01-01 NOTE — Progress Notes (Signed)
[name], HARDENBROOK ([id number]) Visit Report for 01/01/2021 Arrival Information Details Patient Name: [name], [name] Date of Service: 01/01/2021 8:15 AM Medical Record Number: [id number] Patient Account Number: [id number] Date of Birth/Sex: 1980-08-31 (40 y.o. M) Treating RN: Dolan Amen Primary Care Lauriel Helin: [name] Other Clinician: Referring Keisi Eckford: [name] Treating Bellatrix Devonshire/Extender: [name] Cliche in Treatment: 212 Visit Information History Since Last Visit Pain Present Now: No Patient Arrived: Ambulatory Arrival Time: 08:14 Accompanied By: self Transfer Assistance: None Patient Identification Verified: Yes Secondary Verification Process Completed: Yes Patient Requires Transmission-Based No Precautions: Patient Has Alerts: Yes Patient Alerts: Patient has reaction to silver dressings. Electronic Signature(s) Signed: 01/01/2021 4:38:42 PM By: Dolan Amen RN Entered By: Dolan Amen on 01/01/2021 08:45:34 [name] ([id number]) -------------------------------------------------------------------------------- Clinic Level of Care Assessment Details Patient Name: [name] Date of Service: 01/01/2021 8:15 AM Medical Record Number: [id number] Patient Account Number: [id number] Date of Birth/Sex: 06-07-1980 (40 y.o. M) Treating RN: Dolan Amen Primary Care Janai Brannigan: [name] Other Clinician: Referring Emnet Monk: [name] Treating [name] [name]/Extender: [name] Cliche in Treatment: 212 Clinic Level of Care Assessment Items TOOL 1 Quantity Score []  - Use when EandM and Procedure is performed on INITIAL visit 0 ASSESSMENTS - Nursing Assessment / Reassessment []  - General Physical Exam (combine w/ comprehensive assessment (listed just below) when performed on new 0 pt. evals) []  - 0 Comprehensive Assessment (HX, ROS, Risk Assessments, Wounds Hx, etc.) ASSESSMENTS - Wound and Skin Assessment / Reassessment []  - Dermatologic / Skin  Assessment (not related to wound area) 0 ASSESSMENTS - Ostomy and/or Continence Assessment and Care []  - Incontinence Assessment and Management 0 []  - 0 Ostomy Care Assessment and Management (repouching, etc.) PROCESS - Coordination of Care []  - Simple Patient / Family Education for ongoing care 0 []  - 0 Complex (extensive) Patient / Family Education for ongoing care []  - 0 Staff obtains Programmer, systems, Records, Test Results / Process Orders []  - 0 Staff telephones HHA, Nursing Homes / Clarify orders / etc []  - 0 Routine Transfer to another Facility (non-emergent condition) []  - 0 Routine Hospital Admission (non-emergent condition) []  - 0 New Admissions / Biomedical engineer / Ordering NPWT, Apligraf, etc. []  - 0 Emergency Hospital Admission (emergent condition) PROCESS - Special Needs []  - Pediatric / Minor Patient Management 0 []  - 0 Isolation Patient Management []  - 0 Hearing / Language / Visual special needs []  - 0 Assessment of Community assistance (transportation, D/C planning, etc.) []  - 0 Additional assistance / Altered mentation []  - 0 Support Surface(s) Assessment (bed, cushion, seat, etc.) INTERVENTIONS - Miscellaneous []  - External ear exam 0 []  - 0 Patient Transfer (multiple staff / Civil Service fast streamer / Similar devices) []  - 0 Simple Staple / Suture removal (25 or less) []  - 0 Complex Staple / Suture removal (26 or more) []  - 0 Hypo/Hyperglycemic Management (do not check if billed separately) []  - 0 Ankle / Brachial Index (ABI) - do not check if billed separately Has the patient been seen at the hospital within the last three years: Yes Total Score: 0 Level Of Care: ____ [name] ([id number]) Electronic Signature(s) Signed: 01/01/2021 4:38:42 PM By: Dolan Amen RN Entered By: Dolan Amen on 01/01/2021 08:51:39 [name] ([id number]) -------------------------------------------------------------------------------- Compression Therapy  Details Patient Name: [name] Date of Service: 01/01/2021 8:15 AM Medical Record Number: [id number] Patient Account Number: [id number] Date of Birth/Sex: December 09, 1980 (40 y.o. M) Treating RN: Dolan Amen Primary Care Promise Weldin: [name] Other Clinician: Referring Marella Vanderpol: [name] Treating  Evett Kassa/Extender: Jeri Cos Weeks in Treatment: 212 Compression Therapy Performed for Wound Assessment: Wound #1 Left,Lateral Lower Leg Performed By: Cora Daniels, RN Compression Type: Four Layer Pre Treatment ABI: 1.2 Electronic Signature(s) Signed: 01/01/2021 4:38:42 PM By: Dolan Amen RN Entered By: Dolan Amen on 01/01/2021 08:46:05 [name] ([id number]) -------------------------------------------------------------------------------- Encounter Discharge Information Details Patient Name: [name] Date of Service: 01/01/2021 8:15 AM Medical Record Number: [id number] Patient Account Number: [id number] Date of Birth/Sex: 1981/03/23 (40 y.o. M) Treating RN: Dolan Amen Primary Care Hayes Czaja: [name] Other Clinician: Referring Federick Levene: [name] Treating Tiandre Teall/Extender: [name] Cliche in Treatment: 212 Encounter Discharge Information Items Discharge Condition: Stable Ambulatory Status: Ambulatory Discharge Destination: Home Transportation: Private Auto Accompanied By: self Schedule Follow-up Appointment: Yes Clinical Summary of Care: Electronic Signature(s) Signed: 01/01/2021 4:38:42 PM By: Dolan Amen RN Entered By: Dolan Amen on 01/01/2021 08:51:35 [name] ([id number]) -------------------------------------------------------------------------------- Wound Assessment Details Patient Name: [name] Date of Service: 01/01/2021 8:15 AM Medical Record Number: [id number] Patient Account Number: [id number] Date of Birth/Sex: 1980-06-01 (39 y.o. M) Treating RN: Dolan Amen Primary Care Kloi Brodman: [name] Other Clinician: Referring Kaileia Flow: [name] Treating Shavanna Furnari/Extender: [name] Cliche in Treatment: 212 Wound Status Wound Number: 1 Primary Etiology: Pyoderma Wound Location: Left, Lateral Lower Leg Wound Status: Open Wounding Event: Gradually Appeared Date Acquired: 11/18/2015 Weeks Of Treatment: 212 Clustered Wound: No Wound Measurements Length: (cm) 7.1 Width: (cm) 6.2 Depth: (cm) 1.2 Area: (cm) 34.573 Volume: (cm) 41.488 % Reduction in Area: -604.3% % Reduction in Volume: -956.5% Wound Description Classification: Full Thickness With Exposed Support Structures Exudate Amount: Large Exudate Type: Purulent Exudate Color: yellow, brown, green Treatment Notes Wound #1 (Lower Leg) Wound Laterality: Left, Lateral Cleanser Soap and Water Discharge Instruction: Gently cleanse wound with antibacterial soap, rinse and pat dry prior to dressing wounds Peri-Wound Care Topical Triamcinolone Acetonide Cream, 0.1%, 15 (g) tube Discharge Instruction: Apply in office only to 7 o'clock area Primary Dressing Aquacel Extra Hydrofiber Dressing, 4x5 (in/in) Discharge Instruction: Apply on wound bed CarboFLEX Odor Control Dressing, 4x4 (in/in) Discharge Instruction: Apply over gauze Secondary Dressing Gauze Discharge Instruction: Cover alginate with dry gauze Xtrasorb Medium 4x5 (in/in) Discharge Instruction: Apply to wound as directed. Do not cut. Secured With Compression Wrap Medichoice 4 layer Compression System, 35-40 mmHG Discharge Instruction: Apply multi-layer wrap as directed. Compression Stockings [name], [name] ([id number]) Add-Ons Electronic Signature(s) Signed: 01/01/2021 4:38:42 PM By: Dolan Amen RN Entered By: Dolan Amen on 01/01/2021 08:45:45

## 2021-01-04 ENCOUNTER — Encounter: Payer: 59 | Admitting: Physician Assistant

## 2021-01-04 ENCOUNTER — Other Ambulatory Visit: Payer: Self-pay

## 2021-01-04 DIAGNOSIS — L97222 Non-pressure chronic ulcer of left calf with fat layer exposed: Secondary | ICD-10-CM | POA: Diagnosis not present

## 2021-01-04 NOTE — Progress Notes (Addendum)
BRAVEN, WOLK ([id number]) Visit Report for 01/04/2021 Arrival Information Details Patient Name: [name], [name] Date of Service: 01/04/2021 8:15 AM Medical Record Number: [id number] Patient Account Number: [id number] Date of Birth/Sex: [birth year] (40 y.o. M) Treating RN: Dolan Amen Primary Care Kelsa Jaworowski: [name] Other Clinician: Referring Latacha Texeira: [name] Treating Roxas Clymer/Extender: [name] Cliche in Treatment: 213 Visit Information History Since Last Visit Pain Present Now: No Patient Arrived: Ambulatory Arrival Time: 08:23 Accompanied By: self Transfer Assistance: None Patient Identification Verified: Yes Secondary Verification Process Completed: Yes Patient Requires Transmission-Based No Precautions: Patient Has Alerts: Yes Patient Alerts: Patient has reaction to silver dressings. Electronic Signature(s) Signed: 01/04/2021 4:40:40 PM By: Dolan Amen RN Entered By: Dolan Amen on 01/04/2021 08:23:33 [name] ([id number]) -------------------------------------------------------------------------------- Clinic Level of Care Assessment Details Patient Name: [name] Date of Service: 01/04/2021 8:15 AM Medical Record Number: [id number] Patient Account Number: [id number] Date of Birth/Sex: 1980/11/07 (40 y.o. M) Treating RN: Dolan Amen Primary Care Egypt Marchiano: [name] Other Clinician: Referring Shaymus Eveleth: [name] Treating Helmut Hennon/Extender: [name] Cliche in Treatment: 213 Clinic Level of Care Assessment Items TOOL 1 Quantity Score []  - Use when EandM and Procedure is performed on INITIAL visit 0 ASSESSMENTS - Nursing Assessment / Reassessment []  - General Physical Exam (combine w/ comprehensive assessment (listed just below) when performed on new 0 pt. evals) []  - 0 Comprehensive Assessment (HX, ROS, Risk Assessments, Wounds Hx, etc.) ASSESSMENTS - Wound and Skin Assessment / Reassessment []  - Dermatologic / Skin  Assessment (not related to wound area) 0 ASSESSMENTS - Ostomy and/or Continence Assessment and Care []  - Incontinence Assessment and Management 0 []  - 0 Ostomy Care Assessment and Management (repouching, etc.) PROCESS - Coordination of Care []  - Simple Patient / Family Education for ongoing care 0 []  - 0 Complex (extensive) Patient / Family Education for ongoing care []  - 0 Staff obtains Programmer, systems, Records, Test Results / Process Orders []  - 0 Staff telephones HHA, Nursing Homes / Clarify orders / etc []  - 0 Routine Transfer to another Facility (non-emergent condition) []  - 0 Routine Hospital Admission (non-emergent condition) []  - 0 New Admissions / Biomedical engineer / Ordering NPWT, Apligraf, etc. []  - 0 Emergency Hospital Admission (emergent condition) PROCESS - Special Needs []  - Pediatric / Minor Patient Management 0 []  - 0 Isolation Patient Management []  - 0 Hearing / Language / Visual special needs []  - 0 Assessment of Community assistance (transportation, D/C planning, etc.) []  - 0 Additional assistance / Altered mentation []  - 0 Support Surface(s) Assessment (bed, cushion, seat, etc.) INTERVENTIONS - Miscellaneous []  - External ear exam 0 []  - 0 Patient Transfer (multiple staff / Civil Service fast streamer / Similar devices) []  - 0 Simple Staple / Suture removal (25 or less) []  - 0 Complex Staple / Suture removal (26 or more) []  - 0 Hypo/Hyperglycemic Management (do not check if billed separately) []  - 0 Ankle / Brachial Index (ABI) - do not check if billed separately Has the patient been seen at the hospital within the last three years: Yes Total Score: 0 Level Of Care: ____ [name] ([id number]) Electronic Signature(s) Signed: 01/04/2021 4:40:40 PM By: Dolan Amen RN Entered By: Dolan Amen on 01/04/2021 09:16:52 [name] ([id number]) -------------------------------------------------------------------------------- Compression Therapy  Details Patient Name: [name] Date of Service: 01/04/2021 8:15 AM Medical Record Number: [id number] Patient Account Number: [id number] Date of Birth/Sex: 01/26/1981 (40 y.o. M) Treating RN: Dolan Amen Primary Care Barton Want: [name] Other Clinician: Referring Kaio Kuhlman: [name] Treating  Kalon [name]/Extender: Jeri Cos Weeks in Treatment: 213 Compression Therapy Performed for Wound Assessment: Wound #1 Left,Lateral Lower Leg Performed By: Clinician Dolan Amen, RN Compression Type: Four Layer Pre Treatment ABI: 1.2 Post Procedure Diagnosis Same as Pre-procedure Electronic Signature(s) Signed: 01/04/2021 4:40:40 PM By: Dolan Amen RN Entered By: Dolan Amen on 01/04/2021 08:53:52 [name] ([id number]) -------------------------------------------------------------------------------- Encounter Discharge Information Details Patient Name: [name] Date of Service: 01/04/2021 8:15 AM Medical Record Number: [id number] Patient Account Number: [id number] Date of Birth/Sex: 12-Sep-1980 (39 y.o. M) Treating RN: Dolan Amen Primary Care Monico Sudduth: [name] Other Clinician: Referring Parks Czajkowski: [name] Treating Tauno Falotico/Extender: [name] Cliche in Treatment: 213 Encounter Discharge Information Items Discharge Condition: Stable Ambulatory Status: Ambulatory Discharge Destination: Home Transportation: Private Auto Accompanied By: self Schedule Follow-up Appointment: Yes Clinical Summary of Care: Electronic Signature(s) Signed: 01/04/2021 4:40:40 PM By: Dolan Amen RN Entered By: Dolan Amen on 01/04/2021 09:17:38 [name] ([id number]) -------------------------------------------------------------------------------- Lower Extremity Assessment Details Patient Name: [name] Date of Service: 01/04/2021 8:15 AM Medical Record Number: [id number] Patient Account Number: [id number] Date of Birth/Sex: [birth year] (39 y.o.  M) Treating RN: Dolan Amen Primary Care Tameron Lama: [name] Other Clinician: Referring Gemini Bunte: [name] Treating Britanie Harshman/Extender: [name] Cliche in Treatment: 213 Edema Assessment Assessed: [Left: Yes] [Right: No] Edema: [Left: Ye] [Right: s] Calf Left: Right: Point of Measurement: 33 cm From Medial Instep 47.5 cm Ankle Left: Right: Point of Measurement: 12 cm From Medial Instep 29 cm Vascular Assessment Pulses: Dorsalis Pedis Palpable: [Left:Yes] Electronic Signature(s) Signed: 01/04/2021 4:40:40 PM By: Dolan Amen RN Entered By: Dolan Amen on 01/04/2021 08:40:16 [name] ([id number]) -------------------------------------------------------------------------------- Multi Wound Chart Details Patient Name: [name] Date of Service: 01/04/2021 8:15 AM Medical Record Number: [id number] Patient Account Number: [id number] Date of Birth/Sex: 1981-04-25 (39 y.o. M) Treating RN: Dolan Amen Primary Care Renita Brocks: [name] Other Clinician: Referring Ileana Chalupa: [name] Treating Ceriah Kohler/Extender: [name] Cliche in Treatment: 213 Vital Signs Height(in): 71 Pulse(bpm): 93 Weight(lbs): 338 Blood Pressure(mmHg): 137/82 Body Mass Index(BMI): 47 Temperature(F): 98.7 Respiratory Rate(breaths/min): 18 Photos: [N/A:N/A] Wound Location: Left, Lateral Lower Leg N/A N/A Wounding Event: Gradually Appeared N/A N/A Primary Etiology: Pyoderma N/A N/A Comorbid History: Sleep Apnea, Hypertension, Colitis N/A N/A Date Acquired: 11/18/2015 N/A N/A Weeks of Treatment: 213 N/A N/A Wound Status: Open N/A N/A Measurements L x W x D (cm) 6.7x6.2x1.3 N/A N/A Area (cm) : 32.625 N/A N/A Volume (cm) : 42.413 N/A N/A % Reduction in Area: -564.60% N/A N/A % Reduction in Volume: -980.00% N/A N/A Starting Position 1 (o'clock): 6 Ending Position 1 (o'clock): 9 Maximum Distance 1 (cm): 1.3 Undermining: Yes N/A N/A Classification: Full  Thickness With Exposed N/A N/A Support Structures Exudate Amount: Large N/A N/A Exudate Type: Serosanguineous N/A N/A Exudate Color: red, brown N/A N/A Granulation Amount: Large (67-100%) N/A N/A Granulation Quality: Red N/A N/A Necrotic Amount: Small (1-33%) N/A N/A Exposed Structures: Fat Layer (Subcutaneous Tissue): N/A N/A Yes Muscle: Yes Fascia: No Tendon: No Joint: No Bone: No Epithelialization: None N/A N/A Treatment Notes Electronic Signature(s) Signed: 01/04/2021 4:40:40 PM By: Dolan Amen RN Entered By: Dolan Amen on 01/04/2021 08:53:35 [place], Jaquil ([id number]) DOLAN, XIA ([id number]) -------------------------------------------------------------------------------- Multi-Disciplinary Care Plan Details Patient Name: [name] Date of Service: 01/04/2021 8:15 AM Medical Record Number: [id number] Patient Account Number: [id number] Date of Birth/Sex: [birth year] (40 y.o. M) Treating RN: Dolan Amen Primary Care Dariane Natzke: [name] Other Clinician: Referring Zubin Pontillo: [name] Treating Mi Balla/Extender: [name] Cliche in  Treatment: [place] reviewed with physician Active Inactive Electronic Signature(s) Signed: 01/04/2021 4:40:40 PM By: Dolan Amen RN Entered By: Dolan Amen on 01/04/2021 08:53:24 CHRISHUN, SCHEER ([id number]) -------------------------------------------------------------------------------- Pain Assessment Details Patient Name: [name] Date of Service: 01/04/2021 8:15 AM Medical Record Number: [id number] Patient Account Number: [id number] Date of Birth/Sex: [birth year] (39 y.o. M) Treating RN: Dolan Amen Primary Care Lear Carstens: [name] Other Clinician: Referring Ketra Duchesne: [name] Treating Shakaya Bhullar/Extender: [name] Cliche in Treatment: 213 Active Problems Location of Pain Severity and Description of Pain Patient Has Paino No Site Locations Rate the  pain. Current Pain Level: 0 Pain Management and Medication Current Pain Management: Electronic Signature(s) Signed: 01/04/2021 4:40:40 PM By: Dolan Amen RN Entered By: Dolan Amen on 01/04/2021 08:24:44 [name] ([id number]) -------------------------------------------------------------------------------- Patient/Caregiver Education Details Patient Name: [name] Date of Service: 01/04/2021 8:15 AM Medical Record Number: [id number] Patient Account Number: [id number] Date of Birth/Gender: 06-18-1980 (39 y.o. M) Treating RN: Dolan Amen Primary Care Physician: [name] Other Clinician: Referring Physician: [name] Treating Physician/Extender: [name] Cliche in Treatment: 213 Education Assessment Education Provided To: Patient Education Topics Provided Wound/Skin Impairment: Methods: Explain/Verbal Responses: State content correctly Electronic Signature(s) Signed: 01/04/2021 4:40:40 PM By: Dolan Amen RN Entered By: Dolan Amen on 01/04/2021 09:17:09 [name] ([id number]) -------------------------------------------------------------------------------- Wound Assessment Details Patient Name: [name] Date of Service: 01/04/2021 8:15 AM Medical Record Number: [id number] Patient Account Number: [id number] Date of Birth/Sex: December 31, 1980 (39 y.o. M) Treating RN: Dolan Amen Primary Care Suraiya Dickerson: [name] Other Clinician: Referring Itzell Bendavid: [name] Treating Bertine Schlottman/Extender: [name] Cliche in Treatment: 213 Wound Status Wound Number: 1 Primary Etiology: Pyoderma Wound Location: Left, Lateral Lower Leg Wound Status: Open Wounding Event: Gradually Appeared Comorbid History: Sleep Apnea, Hypertension, Colitis Date Acquired: 11/18/2015 Weeks Of Treatment: 213 Clustered Wound: No Photos Wound Measurements Length: (cm) 6.7 Width: (cm) 6.2 Depth: (cm) 1.3 Area: (cm) 32.625 Volume: (cm) 42.413 % Reduction  in Area: -564.6% % Reduction in Volume: -980% Epithelialization: None Tunneling: No Undermining: Yes Starting Position (o'clock): 6 Ending Position (o'clock): 9 Maximum Distance: (cm) 1.3 Wound Description Classification: Full Thickness With Exposed Support Structures Exudate Amount: Large Exudate Type: Serosanguineous Exudate Color: red, brown Foul Odor After Cleansing: No Slough/Fibrino Yes Wound Bed Granulation Amount: Large (67-100%) Exposed Structure Granulation Quality: Red Fascia Exposed: No Necrotic Amount: Small (1-33%) Fat Layer (Subcutaneous Tissue) Exposed: Yes Necrotic Quality: Adherent Slough Tendon Exposed: No Muscle Exposed: Yes Necrosis of Muscle: No Joint Exposed: No Bone Exposed: No Treatment Notes Wound #1 (Lower Leg) Wound Laterality: Left, Lateral Cleanser Fadely, Kahleb ([id number]) Soap and Water Discharge Instruction: Gently cleanse wound with antibacterial soap, rinse and pat dry prior to dressing wounds Peri-Wound Care Topical Triamcinolone Acetonide Cream, 0.1%, 15 (g) tube Discharge Instruction: Apply in office only to 7 o'clock area Primary Dressing Aquacel Extra Hydrofiber Dressing, 4x5 (in/in) Discharge Instruction: Apply on wound bed Secondary Dressing Gauze Discharge Instruction: Cover alginate with dry gauze Xtrasorb Medium 4x5 (in/in) Discharge Instruction: Apply to wound as directed. Do not cut. Secured With Compression Wrap Medichoice 4 layer Compression System, 35-40 mmHG Discharge Instruction: Apply multi-layer wrap as directed. Compression Stockings Add-Ons Electronic Signature(s) Signed: 01/04/2021 4:40:40 PM By: Dolan Amen RN Entered By: Dolan Amen on 01/04/2021 08:38:51 ROLLAND, STEINERT ([id number]) -------------------------------------------------------------------------------- Vitals Details Patient Name: [name] Date of Service: 01/04/2021 8:15 AM Medical Record Number: [id number] Patient Account Number:  [id number] Date of Birth/Sex: March 06, 1981 (39 y.o. M) Treating RN:  Dolan Amen Primary Care Batul Diego: [name] Other Clinician: Referring Dvon Jiles: [name] Treating Jamesha Ellsworth/Extender: [name] Cliche in Treatment: 213 Vital Signs Time Taken: 08:23 Temperature (F): 98.7 Height (in): 71 Pulse (bpm): 93 Weight (lbs): 338 Respiratory Rate (breaths/min): 18 Body Mass Index (BMI): 47.1 Blood Pressure (mmHg): 137/82 Reference Range: 80 - 120 mg / dl Electronic Signature(s) Signed: 01/04/2021 4:40:40 PM By: Dolan Amen RN Entered By: Dolan Amen on 01/04/2021 08:24:26

## 2021-01-04 NOTE — Progress Notes (Addendum)
[name], [name] ([id number]) Visit Report for 01/04/2021 Chief Complaint Document Details Patient Name: [name], [name] Date of Service: 01/04/2021 8:15 AM Medical Record Number: [id number] Patient Account Number: [id number] Date of Birth/Sex: Jun 09, 1980 (40 y.o. 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 M) Treating RN: Dolan Amen Primary Care Provider: [name] Other Clinician: Referring Provider: [name] Treating Provider/Extender: [name] Cliche in Treatment: 213 History of Present Illness HPI Description: 12/04/16; 40 year old man who comes into the clinic today for review of a wound on the posterior left calf. He tells me that is been there for about a year. He is not a diabetic he does smoke half a pack per day. He was seen in the ER on 11/20/16 felt to have cellulitis around the wound and was given clindamycin. An x-ray did not show osteomyelitis. The patient initially tells me that he has a milk allergy that sets off a pruritic itching rash on his lower legs which she scratches incessantly and he thinks that's what may have set up the wound. He has been using various topical antibiotics and ointments without any effect. He works in a [employer] and is on his feet all day. He does not have a  prior history of wounds however he does have the rash on both lower legs the right arm and the ventral aspect of his left arm. These are excoriations and clearly have had scratching however there are of macular looking areas on both legs including a substantial larger area on the right leg. This does not have an underlying open area. There is no blistering. The patient tells me that 2 years ago in [place] in response to the rash on his legs he saw a dermatologist who told him he had a condition which may be pyoderma gangrenosum although I may be putting words into his mouth. He seemed to recognize this. On further questioning he admits to a 5 year history of quiesced. ulcerative colitis. He is not in any treatment for this. He's had no recent travel 12/11/16; the patient arrives today with his wound and roughly the same condition we've been using silver alginate this is a deep punched out wound with some surrounding erythema but no tenderness. Biopsy I did did not show confirmed pyoderma gangrenosum suggested nonspecific inflammation and vasculitis but does not provide an actual description of what was seen by the pathologist. I'm really not able to understand this We have also received information from the patient's dermatologist in [place] notes from April 2016. This was a doctor Agarwal-antal. 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 He was prescribed topical steroid high potency under occlusion which helped but at this point the patient did not have a deep punched out wound. 12/18/16; the patient's wound is larger in terms of surface area however this surface looks better and there is less depth. The surrounding erythema also is better. The patient states that the wrap we put on came off 2 days ago when he has been using his compression stockings.  He we are in the process of getting a dermatology consult. 12/26/16 on evaluation today patient's left lower extremity wound shows evidence of  infection with surrounding erythema noted. He has been tolerating the dressing changes but states that he has noted more discomfort. There is a larger area of erythema surrounding the wound. No fevers, chills, nausea, or vomiting noted at this time. With that being said the wound still does have slough covering the surface. He is not allergic to any medication that he is aware of at this point. In regard to his right lower extremity he had several regions that are erythematous and pruritic he wonders if there's anything we can do to help that. 01/02/17 I reviewed patient's wound culture which was obtained his visit last week. He was placed on doxycycline at that point. Unfortunately that does not appear to be an antibiotic that would likely help with the situation however the pseudomonas noted on culture is sensitive to Cipro. Also unfortunately patient's wound seems to have a large compared to last week's evaluation. Not severely so but there are definitely increased measurements in general. He is continuing to have discomfort as well he writes this to be a seven out of 10. In fact he would prefer me not to perform any debridement today due to the fact that he is having discomfort and considering he has an active infection on the little reluctant to do so anyway. No fevers, chills, nausea, or vomiting noted at this time. 01/08/17; patient seems dermatology on September 5. I suspect dermatology will want the slides from the biopsy I did sent to their pathologist. I'm not sure if there is a way we can expedite that. In any case the culture I did before I left on vacation 3 weeks ago showed Pseudomonas he was given 10 days of Cipro and per her description of her intake nurses is actually somewhat better this week although the wound is quite a bit bigger than I remember the last time I saw this. He still has 3 more days of Cipro 01/21/17; dermatology appointment tomorrow. He has completed the ciprofloxacin  for Pseudomonas. Surface of the wound looks better however he is had some deterioration in the lesions on his right leg. Meantime the left lateral leg wound we will continue with sample 01/29/17; patient had his dermatology appointment but I can't yet see that note. He is completed his antibiotics. The wound is more superficial but considerably larger in circumferential area than when he came in. This is in his left lateral calf. He also has swollen erythematous areas with superficial wounds on the right leg and small papular areas on both arms. There apparently areas in her his upper thighs and buttocks I did not look at those. Dermatology biopsied the right leg. Hopefully will have their input next week. 02/05/17; patient went back to see his dermatologist who told him that he had a "scratching problem" as well as staph. He is now on a 30 day course of doxycycline and I believe she gave him triamcinolone cream to the right leg areas to help with the itching [not exactly sure but probably triamcinolone]. She apparently looked at the left lateral leg wound although this was not rebiopsied and I think felt to be ultimately part of the same pathogenesis. He is using sample border foam and changing nevus himself. He now has a new open area on the right posterior leg which was his biopsy site I don't have any of the dermatology notes  02/12/17; we put the patient in compression last week with SANTYL to the wound on the left leg and the biopsy. Edema is much better and the depth of the wound is now at level of skin. Area is still the same oBiopsy site on the right lateral leg we've also been using santyl with a border foam dressing and he is changing this himself. 02/19/17; Using silver alginate started last week to both the substantial left leg wound and the biopsy site on the right wound. He is tolerating compression well. Has a an appointment with his primary M.D. tomorrow wondering about diuretics although  I'm wondering if the edema problem is actually lymphedema 02/26/17; the patient has been to see his primary doctor Dr. Jerrel Ivory at [facility] our primary care. She started him on Lasix 20 mg and this seems to have helped with the edema. However we are not making substantial change with the left lateral calf wound and inflammation. The biopsy site on the right leg also looks stable but not really all that different. 03/12/17; the patient has been to see vein and vascular Dr. Lucky Cowboy. He has had venous reflux studies I have not reviewed these. I did get a call from his dermatology office. They felt that he might have pathergy based on their biopsy on his right leg which led them to look at the slides of [name], [name] ([id number]) the biopsy I did on the left leg and they wonder whether this represents pyoderma gangrenosum which was the original supposition in a man with ulcerative colitis albeit inactive for many years. They therefore recommended clobetasol and tetracycline i.e. 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 03/26/17; apparently the patient just had reflux studies not an appointment with Dr. dew. She arrives in clinic today having applied clobetasol for 2-3 weeks. He notes over the last 2-3 days excessive drainage having to change the dressing 3-4 times a day and also expanding erythema. 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 Lillia Carmel at [facility] dermatology locally who kindly saw him at my request. 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 My concern was the degree of erythema medially extending from his left leg wound which was either inflammation from pyoderma  or cellulitis. I put him on Augmentin however culture of the wound showed Pseudomonas which is quinolone sensitive. I really don't believe he has cellulitis however in view of everything I will continue and give him a course of Cipro. He is also on doxycycline as an immune modulator for the pyoderma. In addition to his original wound on the left lateral leg with surrounding erythema he has a wound on the right posterior calf which was an original biopsy site done by dermatology. 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 Saw Dr. Lillia Carmel yesterday. He has been using topical antibiotics to both wound areas his original wound on the left and the biopsies/pathergy area on the right. There is definitely some improvement in the inflammation around the wound on the right although the patient states he has increasing sensitivity of the wounds. He is on prednisone 60 and doxycycline 1 as prescribed by Dr. Lillia Carmel. He is covering the topical antibiotic with gauze  and putting this in his own compression stocks and changing this daily. He states that Dr. Lottie Rater did a culture of the left leg wound yesterday 05/07/17; pyoderma gangrenosum. The patient saw Dr. Lillia Carmel yesterday and has a follow-up with her in one month. He is still using topical antibiotics to both wounds although he can't recall exactly what type. He is still on prednisone 60 mg. Dr. Lillia Carmel stated that the doxycycline could stop if we were in agreement. 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 The right medial leg was induced by biopsy/pathergy. The area on the right is essentially healed. 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 The right medial leg has closed and remains closed. He  is still on prednisone 60. oHe tells me he missed his last dermatology appointment with Dr. Lillia Carmel but will make another appointment. He reports that her blood sugar at a recent screen in [place] was high 200's. He was 180 today. 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 I think he is going to require still much longer prednisone perhaps another 3 months before attempting to taper. In the meantime his wound is a lot better. Smaller. He is cleaning this off daily and applying topical antibiotics. When he was last in the clinic I thought about changing to [facility] and actually put in a couple of calls to dermatology although probably not during their business hours. 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 He continues on oral prednisone. He has been using triple antibiotic ointment. There is surface debris and we will transition to [facility] and have him return in 2 weeks. He has lost 30 pounds since his last appointment with lifestyle modification. He may benefit from topical steroid cream for treatment this can be considered at a later date. 08/22/17 on evaluation today patient appears to actually be doing rather well in regard to his left lateral lower extremity ulcer. He has actually been managed by Dr. Dellia Nims most recently. Patient is currently on oral steroids at this time. This seems to have been of benefit for him. Nonetheless his last visit was actually with Leah on 08/06/17. Currently he is not utilizing any topical steroid creams although this could be of benefit as well. No fevers, chills, nausea, or vomiting noted at this time. 09/05/17 on evaluation today patient appears to be doing better in regard to his left lateral lower extremity ulcer. He has been tolerating the dressing changes without complication. He is using Santyl with good effect. Overall I'm  very pleased with how things are standing at this point. Patient likewise is happy that this is doing better. 09/19/17 on evaluation today patient actually appears to be doing rather well in regard to his left lateral lower extremity ulcer. 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 He's not having any significant pain. 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 He has been tolerating the Santyl without complication and in general I feel like he's making good progress. 10/17/17 on evaluation today patient  appears to be doing very well in regard to his left lateral lower surety ulcer. He has been tolerating the dressing changes without complication. There does not appear to be any evidence of infection he's alternating the Santyl and the triple antibiotic ointment every other day this seems to be doing well for him. 11/03/17 on evaluation today patient appears to be doing very well in regard to his left lateral lower extremity ulcer. He is been tolerating the dressing changes without complication which is good news. Fortunately there does not appear to be any evidence of infection which is also great news. Overall is doing excellent they are starting to taper down on the prednisone is down to 40 mg at this point it also started topical clobetasol for him. 11/17/17 on evaluation today patient appears to be doing well in regard to his left lateral lower surety ulcer. He's been tolerating the dressing changes without complication. He does note that he is having no pain, no excessive drainage or discharge, and overall he feels like things are going about how he would expect and hope they would. Overall he seems to have no evidence of infection at this time in my opinion which is good news. 12/04/17-He is seen in follow-up  evaluation for right lateral lower extremity ulcer. He has been applying topical steroid cream. Today's measurement show slight increase in size. Over the next 2 weeks we will transition to every other day Santyl and steroid cream. He has been encouraged to monitor for changes and notify clinic with any concerns 12/15/17 on evaluation today patient's left lateral motion the ulcer and fortunately is doing worse again at this point. This just since last week to this week has close to doubled in size according to the patient. I did not seeing last week's I do not have a visual to compare this to in our system was also down so we do not have all the charts and at this point. Nonetheless it does have me somewhat concerned in regard to the fact that again he was worried enough about it he has contact the dermatology that placed them back on the full strength, 50 mg a day of the prednisone that he was taken previous. He continues to alternate using clobetasol along with Santyl at this point. He is obviously somewhat frustrated. 12/22/17 on evaluation today patient appears to be doing a little worse compared to last evaluation. Unfortunately the wound is a little deeper and slightly larger than the last week's evaluation. With that being said he has made some progress in regard to the irritation surrounding at this time unfortunately despite that progress that's been made he still has a significant issue going on here. 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 [name], [name] ([id number]) For that reason I discussed with him today the possibility of perform a wound culture to ensure there's nothing overtly infected. 01/06/18 on evaluation today patient's wound is larger and deeper than previously evaluated. With that being said it did appear that his wound was  infected after my last evaluation with him. Subsequently I did end up prescribing a prescription for Bactrim DS which she has been taking and having no complication with. Fortunately there does not appear to be any evidence of infection at this point in time as far as anything spreading, no want to touch, and overall I feel like  things are showing signs of improvement. 01/13/18 on evaluation today patient appears to be even a little larger and deeper than last time. There still muscle exposed in the base of the wound. Nonetheless he does appear to be less erythematous I do believe inflammation is calming down also believe the infection looks like it's probably resolved at this time based on what I'm seeing. No fevers, chills, nausea, or vomiting noted at this time. 01/30/18 on evaluation today patient actually appears to visually look better for the most part. Unfortunately those visually this looks better he does seem to potentially have what may be an abscess in the muscle that has been noted in the central portion of the wound. This is the first time that I have noted what appears to be fluctuance in the central portion of the muscle. With that being said I'm somewhat more concerned about the fact that this might indicate an abscess formation at this location. I do believe that an ultrasound would be appropriate. This is likely something we need to try to do as soon as possible. He has been switch to mupirocin ointment and he is no longer using the steroid ointment as prescribed by dermatology he sees them again next week he's been decreased from 60 to 40 mg of prednisone. 03/09/18 on evaluation today patient actually appears to be doing a little better compared to last time I saw him. There's not as much erythema surrounding the wound itself. He I did review his most recent infectious disease note which was dated 02/24/18. He saw Dr. Michel Bickers in [place]. 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 Patient is now off of antibiotics and they are continually observing this. There seems to be no change in the past two weeks in my pinion based on what the patient says and what I see today compared to what Dr. Megan Salon likely saw two weeks ago. No fevers, chills, nausea, or vomiting noted at this time. 03/23/18 on evaluation today patient's wound actually appears to be showing signs of improvement which is good news. He is currently still on the Dapsone. He is also working on tapering the prednisone to get off of this and Dr. Lottie Rater is working with him in this regard. Nonetheless overall I feel like the wound is doing well it does appear based on the infectious disease note that I reviewed from Dr. 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 I did also review the results of his ultrasound of left lower extremity which revealed there was a dentist tissue in the base of the wound without an abscess noted. 04/06/18 on evaluation today the patient's left lateral lower extremity ulcer actually appears to be doing fairly well which is excellent news. There does not appear to be any evidence of infection at this time which is also great news. Overall he still does have a significantly large ulceration although little by little he seems to be making progress. He is down to 10 mg a day of the prednisone. 04/20/18 on evaluation today patient actually appears to be doing excellent at this time in regard to his left lower extremity ulcer. He's making signs of good progress unfortunately this is taking much longer than we would really like to see but nonetheless he is  making progress. Fortunately there does not appear to be any evidence of infection at this time. No fevers, chills, nausea, or vomiting noted at  this time. The patient has not been using the Santyl due to the cost he hadn't got in this field yet. He's mainly been using the antibiotic ointment topically. Subsequently he also tells me that he really has not been scrubbing in the shower I think this would be helpful again as I told him it doesn't have to be anything too aggressive to even make it believe just enough to keep it free of some of the loose slough and biofilm on the wound surface. 05/11/18 on evaluation today patient's wound appears to be making slow but sure progress in regard to the left lateral lower extremity ulcer. He is been tolerating the dressing changes without complication. Fortunately there does not appear to be any evidence of infection at this time. He is still just using triple antibiotic ointment along with clobetasol occasionally over the area. He never got the Santyl and really does not seem to intend to in my pinion. 06/01/18 on evaluation today patient appears to be doing a little better in regard to his left lateral lower extremity ulcer. He states that overall he does not feel like he is doing as well with the Dapsone as he did with the prednisone. Nonetheless he sees his dermatologist later today and is gonna talk to them about the possibility of going back on the prednisone. Overall again I believe that the wound would be better if you would utilize Santyl but he really does not seem to be interested in going back to the [place] at this point. He has been using triple antibiotic ointment. 06/15/18 on evaluation today patient's wound actually appears to be doing about the same at this point. Fortunately there is no signs of infection at this time. He has made slight improvements although he continues to not really want to clean the wound bed at this point. He states that he just doesn't mess with it he doesn't want to cause any problems with everything else he has going on. He has been on medication, antibiotics  as prescribed by his dermatologist, for a staff infection of his lower extremities which is really drying out now and looking much better he tells me. Fortunately there is no sign of overall infection. 06/29/18 on evaluation today patient appears to be doing well in regard to his left lateral lower surety ulcer all things considering. Fortunately his staff infection seems to be greatly improved compared to previous. He has no signs of infection and this is drying up quite nicely. He is still the doxycycline for this is no longer on cental, Dapsone, or any of the other medications. His dermatologist has recommended possibility of an infusion but right now he does not want to proceed with that. 07/13/18 on evaluation today patient appears to be doing about the same in regard to his left lateral lower surety ulcer. Fortunately there's no signs of infection at this time which is great news. Unfortunately he still builds up a significant amount of Slough/biofilm of the surface of the wound he still is not really cleaning this as he should be appropriately. Again I'm able to easily with saline and gauze remove the majority of this on the surface which if you would do this at home would likely be a dramatic improvement for him as far as getting the area to improve. Nonetheless overall I still feel like he  is making progress is just very slow. I think Santyl will be of benefit for him as well. Still he has not gotten this as of this point. 07/27/18 on evaluation today patient actually appears to be doing little worse in regards of the erythema around the periwound region of the wound he also tells me that he's been having more drainage currently compared to what he was experiencing last time I saw him. He states not quite as bad as what he had because this was infected previously but nonetheless is still appears to be doing poorly. Fortunately there is no evidence of systemic infection at this point. The patient  tells me that he is not going to be able to afford the Santyl. He is still waiting to hear about the infusion therapy with his dermatologist. Apparently she wants an updated colonoscopy first. 08/10/18 on evaluation today patient appears to be doing better in regard to his left lateral lower extremity ulcer. Fortunately he is showing signs of improvement in this regard he's actually been approved for Remicade infusion's as well although this has not been scheduled as of yet. Fortunately there's no signs of active infection at this time in regard to the wound although he is having some issues with infection of the right lower extremity is been seen as dermatologist for this. Fortunately they are definitely still working with him trying to keep things under control. [name], [name] ([id number]) 09/07/18 on evaluation today patient is actually doing rather well in regard to his left lateral lower extremity ulcer. He notes these actually having some hair grow back on his extremity which is something he has not seen in years. He also tells me that the pain is really not giving them any trouble at this time which is also good news overall she is very pleased with the progress he's using a combination of the mupirocin along with the probate is all mixed. 09/21/18 on evaluation today patient actually appears to be doing fairly well all things considered in regard to his looks from the ulcer. He's been tolerating the dressing changes without complication. Fortunately there's no signs of active infection at this time which is good news he is still on all antibiotics or prevention of the staff infection. He has been on prednisone for time although he states it is gonna contact his dermatologist and see if she put them on a short course due to some irritation that he has going on currently. Fortunately there's no evidence of any overall worsening this is going very slow I think cental would be something that would be  helpful for him although he states that $50 for tube is quite expensive. He therefore is not willing to get that at this point. 10/06/18 on evaluation today patient actually appears to be doing decently well in regard to his left lateral leg ulcer. He's been tolerating the dressing changes without complication. Fortunately there's no signs of active infection at this time. Overall I'm actually rather pleased with the progress he's making although it's slow he doesn't show any signs of infection and he does seem to be making some improvement. I do believe that he may need a switch up and dressings to try to help this to heal more appropriately and quickly. 10/19/18 on evaluation today patient actually appears to be doing better in regard to his left lateral lower extremity ulcer. This is shown signs of having much less Slough buildup at this point due to the fact he has been using  the Santyl. Obviously this is very good news. The overall size of the wound is not dramatically smaller but again the appearance is. 11/02/18 on evaluation today patient actually appears to be doing quite well in regard to his lower [place] ulcer. A lot of the skin around the ulcer is actually somewhat irritating at this point this seems to be more due to the dressing causing irritation from the adhesive that anything else. Fortunately there is no signs of active infection at this time. 11/24/18 on evaluation today patient appears to be doing a little worse in regard to his overall appearance of his lower extremity ulcer. There's more erythema and warmth around the wound unfortunately. He is currently on doxycycline which he has been on for some time. With that being said I'm not sure that seems to be helping with what appears to possibly be an acute cellulitis with regard to his left lower extremity ulcer. No fevers, chills, nausea, or vomiting noted at this time. 12/08/18 on evaluation today patient's wounds actually appears to  be doing significantly better compared to his last evaluation. He has been using Santyl along with alternating tripling about appointment as well as the steroid cream seems to be doing quite well and the wound is showing signs of improvement which is excellent news. Fortunately there's no evidence of infection and in fact his culture came back negative with only normal skin flora noted. 12/21/2018 upon evaluation today patient actually appears to be doing excellent with regard to his ulcer. This is actually the best that I have seen it since have been helping to take care of him. It is both smaller as well as less slough noted on the surface of the wound and seems to be showing signs of good improvement with new skin growing from the edges. He has been using just the triamcinolone he does wonder if he can get a refill of that ointment today. 01/04/2019 upon evaluation today patient actually appears to be doing well with regard to his left lateral lower extremity ulcer. With that being said it does not appear to be that he is doing quite as well as last time as far as progression is concerned. There does not appear to be any signs of infection or significant irritation which is good news. With that being said I do believe that he may benefit from switching to a collagen based dressing based on how clean The wound appears. 01/18/2019 on evaluation today patient actually appears to be doing well with regard to his wound on the left lower extremity. He is not made a lot of progress compared to where we were previous but nonetheless does seem to be doing okay at this time which is good news. There is no signs of active infection which is also good news. My only concern currently is I do wish we can get him into utilizing the collagen dressing his insurance would not pay for the supplies that we ordered although it appears that he may be able to order this through his supply company that he typically utilizes.  This is [employer]. Nonetheless he did try to order it during the office visit today and it appears this did go through. We will see if he can get that it is a different brand but nonetheless he has collagen and I do think will be beneficial. 02/01/2019 on evaluation today patient actually appears to be doing a little worse today in regard to the overall size of his wounds. Fortunately there  is no signs of active infection at this time. That is visually. Nonetheless when this is happened before it was due to infection. For that reason were somewhat concerned about that this time as well. 02/08/2019 on evaluation today patient unfortunately appears to be doing slightly worse with regard to his wound upon evaluation today. Is measuring a little deeper and a little larger unfortunately. I am not really sure exactly what is causing this to enlarge he actually did see his dermatologist she is going to see about initiating Humira for him. Subsequently she also did do steroid injections into the wound itself in the periphery. Nonetheless still nonetheless he seems to be getting a little bit larger he is gone back to just using the steroid cream topically which I think is appropriate. I would say hold off on the collagen for the time being is definitely a good thing to do. Based on the culture results which we finally did get the final result back regarding it shows staph as the bacteria noted again that can be a normal skin bacteria based on the fact however he is having increased drainage and worsening of the wound measurement wise I would go ahead and place him on an antibiotic today I do believe for this. 02/15/2019 on evaluation today patient actually appears to be doing somewhat better in regard to his ulcer. There is no signs of worsening at this time I did review his culture results which showed evidence of Staphylococcus aureus but not MRSA. Again this could just be more related to the normal skin  bacteria although he states the drainage has slowed down quite a bit he may have had a mild infection not just colonization. And was much smaller and then since around10/04/2019 on evaluation today patient appears to be doing unfortunately worse as far as the size of the wound. I really feel like that this is steadily getting larger again it had been doing excellent right at the beginning of September we have seen a steady increase in the area of the wound it is almost 2-1/2 times the size it was on September 1. Obviously this is a bad trend this is not wanting to see. For that reason we went back to using just the topical triamcinolone cream which does seem to help with inflammation. I checked him for bacteria by way of culture and nothing showed positive there. I am considering giving him a short course of a tapering steroid Dosepak today to see if that is can be beneficial for him. The patient is in agreement with giving that a try. 03/08/2019 on evaluation today patient appears to be doing very well in comparison to last evaluation with regard to his lower extremity ulcer. This is showing signs of less inflammation and actually measuring slightly smaller compared to last time every other week over the past month and a half he has been measuring larger larger larger. Nonetheless I do believe that the issue has been inflammation the prednisone does seem to [facility], [name] ([id number]) have been beneficial for him which is good news. No fevers, chills, nausea, vomiting, or diarrhea. 03/22/2019 on evaluation today patient appears to be doing about the same with regard to his leg ulcer. He has been tolerating the dressing changes without complication. With that being said the wound seems to be mostly arrested at its current size but really is not making any progress except for when we prescribed the prednisone. He did show some signs of dropping as far as  the overall size of the wound during that interval  week. Nonetheless this is something he is not on long-term at this point and unfortunately I think he is getting need either this or else the Humira which his dermatologist has discussed try to get approval for. With that being said he will be seeing his dermatologist on the 11th of this month that is November. 04/19/2019 on evaluation today patient appears to be doing really about the same the wound is measuring slightly larger compared to last time I saw him. He has not been into the office since November 2 due to the fact that he unfortunately had Covid as that his entire family. He tells me that it was rough but they did pull-through and he seems to be doing much better. Fortunately there is no signs of active infection at this time. No fevers, chills, nausea, vomiting, or diarrhea. 05/10/2019 on evaluation today patient unfortunately appears to be doing significantly worse as compared to last time I saw him. He does tell me that he has had his first dose of Humira and actually is scheduled to get the next one in the upcoming week. With that being said he tells me also that in the past several days he has been having a lot of issues with green drainage she showed me a picture this is more blue-green in color. He is also been having issues with increased sloughy buildup and the wound does appear to be larger today. Obviously this is not the direction that we want everything to take based on the starting of his Humira. Nonetheless I think this is definitely a result of likely infection and to be honest I think this is probably Pseudomonas causing the infection based on what I am seeing. 05/24/2019 on evaluation today patient unfortunately appears to be doing significantly worse compared to his prior evaluation with me 2 weeks ago. I did review his culture results which showed that he does have Staph aureus as well as Pseudomonas noted on the culture. Nonetheless the Levaquin that I prescribed for him  does not appear to have been appropriate and in fact he tells me he is no longer experiencing the green drainage and discharge that he had at the last visit. Fortunately there is no signs of active infection at this time which is good news although the wound has significantly worsened it in fact is much deeper than it was previous. We have been utilizing up to this point triamcinolone ointment as the prescription topical of choice but at this time I really feel like that the wound is getting need to be packed in order to appropriately manage this due to the deeper nature of the wound. Therefore something along the lines of an alginate dressing may be more appropriate. 05/31/2019 upon inspection today patient's wound actually showed signs of doing poorly at this point. Unfortunately he just does not seem to be making any good progress despite what we have tried. He actually did go ahead and pick up the Cipro and start taking that as he was noticing more green drainage he had previously completed the Levaquin that I prescribed for him as well. Nonetheless he missed his appointment for the seventh last week on Wednesday with the wound care center and [facility] where his dermatologist referred him. Obviously I do think a second opinion would be helpful at this point especially in light of the fact that the patient seems to be doing so poorly despite the fact  that we have tried everything that I really know how at this point. The only thing that ever seems to have helped him in the past is when he was on high doses of continual steroids that did seem to make a difference for him. 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 06/07/2019 upon evaluation today patient unfortunately appears to be doing worse yet again with regard to his wound. In fact I  am starting to question whether or not he may have a fluid pocket in the muscle at this point based on the bulging and the soft appearance to the central portion of the muscle area. There is not anything draining from the muscle itself at this time which is good news but nonetheless the wound is expanding. I am not really seeing any results of the Humira as far as overall wound progression based on what I am seeing at this point. The patient has been referred for second opinion with regard to his wound to the [facility] wound care center by his dermatologist which I definitely am not in opposition to. Unfortunately we tried multiple dressings in the past including collagen, alginate, and at one point even Hydrofera Blue. With that being said he is never really used it for any significant amount of time due to the fact that he often complains of pain associated with these dressings and then will go back to either using the Santyl which she has done intermittently or more frequently the triamcinolone. He is also using his own compression stockings. We have wrapped him in the past but again that was something else that he really was not a big fan of. 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 He seemed to do much better when he was on the steroids although honestly I understand that the steroids are not necessarily the best medication to be on long-term obviously 06/14/2019 on evaluation today patient appears to be doing actually a little bit better with regard to the overall appearance with his leg. Unfortunately he does continue to have issues with what appears to be some fluid underneath  the muscle although he did see the wound specialty center at [facility] last week their main goals were to see about infusion therapy in place of the Humira as they feel like that is not quite strong enough. They also recommended that we continue with the treatment otherwise as we are they felt like that was appropriate and they are okay with him continuing to follow-up here with [place] in that regard. With that being said they are also sending him to the vein specialist there to see about vein stripping and if that would be of benefit for him. Subsequently they also did not really address whether or not an ultrasound of the muscle area to see if there is anything that needs to be addressed here would be appropriate or not. For that reason I discussed this with him last week I think we  may proceed down that road at this point. 06/21/2019 upon evaluation today patient's wound actually appears to be doing slightly better compared to previous evaluations. I do believe that he has made a difference with regard to the progression here with the use of oral steroids. Again in the past has been the only thing that is really calm things down. He does tell me that from [facility] is gotten a good news from there that there are no further vein stripping that is necessary at this point. I do not have that available for review today although the patient did relay this to me. He also did obtain and have the ultrasound of the wound completed which I did sign off on today. It does appear that there is no fluid collection under the muscle this is likely then just edematous tissue in general. That is also good news. Overall I still believe the inflammation is the main issue here. He did inquire about the possibility of a wound VAC again with the muscle protruding like it is I am not really sure whether the wound VAC is necessarily ideal or not. That is something we will have to consider although I do believe he may need compression  wrapping to try to help with edema control which could potentially be of benefit. 06/28/2019 on evaluation today patient appears to be doing slightly better measurement wise although this is not terribly smaller he least seems to be trending towards that direction. With that being said he still seems to have purulent drainage noted in the wound bed at this time. He has been on Levaquin followed by Cipro over the past month. Unfortunately he still seems to have some issues with active infection at this time. I did perform a culture last week in order to evaluate and see if indeed there was still anything going on. Subsequently the culture did come back showing Pseudomonas which is consistent with the drainage has been having which is blue-green in color. He also has had an odor that again was somewhat consistent with Pseudomonas as well. Long story short it appears that the culture showed an intermediate finding with regard to how well the Cipro will work for the Pseudomonas infection. Subsequently being that he does not seem to be clearing up and at best what we are doing is just keeping this at [place] I think he may need to see infectious disease to discuss IV antibiotic options. [name], [name] ([id number]) 07/05/2019 upon evaluation today patient appears to be doing okay in regard to his leg ulcer. He has been tolerating the dressing changes at this point without complication. Fortunately there is no signs of active infection at this time which is good news. No fevers, chills, nausea, vomiting, or diarrhea. With that being said he does have an appointment with infectious disease tomorrow and his primary care on Wednesday. Again the reason for the infectious disease referral was due to the fact that he did not seem to be fully resolving with the use of oral antibiotics and therefore we were thinking that IV antibiotic therapy may be necessary secondary to the fact that there was an intermediate finding for  how effective the Cipro may be. Nonetheless again he has been having a lot of purulent and even green drainage. Fortunately right now that seems to have calmed down over the past week with the reinitiation of the oral antibiotic. Nonetheless we will see what Dr. Megan Salon has to say. 07/12/2019 upon evaluation today patient appears to be  doing about the same at this point in regard to his left lower extremity ulcer. Fortunately there is no signs of active infection at this time which is good news I do believe the Levaquin has been beneficial I did review Dr. Hale Bogus note and to be honest I agree that the patient's leg does appear to be doing better currently. What we found in the past as he does not seem to really completely resolve he will stop the antibiotic and then subsequently things will revert back to having issues with blue-green drainage, increased pain, and overall worsening in general. Obviously that is the reason I sent him back to infectious disease. 07/19/2019 upon evaluation today patient appears to be doing roughly the same in size there is really no dramatic improvement. He has started back on the Levaquin at this point and though he seems to be doing okay he did still have a lot of blue/green drainage noted on evaluation today unfortunately. I think that this is still indicative more likely of a Pseudomonas infection as previously noted and again he does see Dr. Megan Salon in just a couple of days. I do not know that were really able to effectively clear this with just oral antibiotics alone based on what I am seeing currently. Nonetheless we are still continue to try to manage as best we can with regard to the patient and his wound. I do think the wrap was helpful in decreasing the edema which is excellent news. No fevers, chills, nausea, vomiting, or diarrhea. 07/26/2019 upon evaluation today patient appears to be doing slightly better with regard to the overall appearance of the muscle  there is no dark discoloration centrally. Fortunately there is no signs of active infection at this time. No fevers, chills, nausea, vomiting, or diarrhea. Patient's wound bed currently the patient did have an appointment with Dr. Megan Salon at infectious disease last week. With that being said Dr. Megan Salon the patient states was still somewhat hesitant about put him on any IV antibiotics he wanted [place] to repeat cultures today and then see where things go going forward. He does look like Dr. Megan Salon because of some improvement the patient did have with the Levaquin wanted [place] to see about repeating cultures. If it indeed grows the Pseudomonas again then he recommended a possibility of considering a PICC line placement and IV antibiotic therapy. He plans to see the patient back in 1 to 2 weeks. 08/02/2019 upon evaluation today patient appears to be doing poorly with regard to his left lower extremity. We did get the results of his culture back it shows that he is still showing evidence of Pseudomonas which is consistent with the purulent/blue-green drainage that he has currently. Subsequently the culture also shows that he now is showing resistance to the oral fluoroquinolones which is unfortunate as that was really the only thing to treat the infection prior. I do believe that he is looking like this is going require IV antibiotic therapy to get this under control. Fortunately there is no signs of systemic infection at this time which is good news. The patient does see Dr. Megan Salon tomorrow. 08/09/2019 upon evaluation today patient appears to be doing better with regard to his left lower extremity ulcer in regard to the overall appearance. He is currently on IV antibiotic therapy. As ordered by Dr. Megan Salon. Currently the patient is on ceftazidime which she is going to take for the next 2 weeks and then follow-up for 4 to 5-week appointment with Dr. Megan Salon.  The patient started this this past Friday  symptoms have not for a total of 3 days currently in full. 08/16/2019 upon evaluation today patient's wound actually does show muscle in the base of the wound but in general does appear to be much better as far as the overall evidence of infection is concerned. In fact I feel like this is for the most part cleared up he still on the IV antibiotics he has not completed the full course yet but I think he is doing much better which is excellent news. 08/23/2019 upon evaluation today patient appears to be doing about the same with regard to his wound at this point. He tells me that he still has pain unfortunately. Fortunately there is no evidence of systemic infection at this time which is great news. There is significant muscle protrusion. 09/13/19 upon evaluation today patient appears to be doing about the same in regard to his leg unfortunately. He still has a lot of drainage coming from the ulceration there is still muscle exposed. With that being said the patient's last wound culture still showed an intermediate finding with regard to the Pseudomonas he still having the bluish/green drainage as well. Overall I do not know that the wound has completely cleared of infection at this point. Fortunately there is no signs of active infection systemically at this point which is good news. 09/20/2019 upon evaluation today patient's wound actually appears to be doing about the same based on what I am seeing currently. I do not see any signs of systemic infection he still does have evidence of some local infection and drainage. He did see Dr. Megan Salon last week and Dr. Megan Salon states that he probably does need a different IV antibiotic although he does not want to put him on this until the patient begins the Remicade infusion which is actually scheduled for about 10 days out from today on 13 May. Following that time Dr. Megan Salon is good to see him back and then will evaluate the feasibility of starting him on the  IV antibiotic therapy once again at that point. I do not disagree with this plan I do believe as Dr. Megan Salon stated in his note that I reviewed today that the patient's issue is multifactorial with the pyoderma being 1 aspect of this that were hoping the Remicade will be helpful for her. In the meantime I think the gentamicin is, helping to keep things under decent okay control in regard to the ulcer. 09/27/2019 upon evaluation today patient appears to be doing about the same with regard to his wound still there is a lot of muscle exposure though he does have some hyper granulation tissue noted around the edge and actually some granulation tissue starting to form over the muscle which is actually good news. Fortunately there is no evidence of active infection which is also good news. His pain is less at this point. 5/21; this is a patient I have not seen in a long time. 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 He has a large wound on the left lateral leg with protruding muscle. He comes in the clinic today showing the same area on his left medial ankle. He says there is been a spot there for some time although we have not previously defined this. Today he has a clearly defined area with slight amount of skin breakdown surrounded by raised areas with a purplish hue in color. This is not painful he says it is irritated.  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 He still has muscle protruding from the lateral portion of his left leg but fortunately the new area that they were concerned about at his last visit does not appear to have opened at this point. He is currently on Remicade infusions and seems to be doing better in my opinion in fact the wound itself seems to be overall much better. The purplish discoloration that he did have seems to have resolved  and I think that is a good sign that hopefully the Remicade is doing its job. He does have some biofilm noted over the surface of the wound. 11/01/2019 on evaluation today patient's wound actually appears to be doing excellent at this time. Fortunately there is no evidence of active infection and overall I feel like he is making great progress. The Remicade seems to be due excellent job in my opinion. [name], [name] ([id number]) 11/08/19 evaluation today vision actually appears to be doing quite well with regard to his weight ulcer. He's been tolerating dressing changes without complication. Fortunately there is no evidence of infection. No fevers, chills, nausea, or vomiting noted at this time. Overall states that is having more itching than pain which is actually a good sign in my opinion. 12/13/2019 upon evaluation today patient appears to be doing well today with regard to his wound. He has been tolerating the dressing changes without complication. Fortunately there is no sign of active infection at this time. No fevers, chills, nausea, vomiting, or diarrhea. Overall I feel like the infusion therapy has been very beneficial for him. 01/06/2020 on evaluation today patient appears to be doing well with regard to his wound. This is measuring smaller and actually looks to be doing better. Fortunately there is no signs of active infection at this point. No fevers, chills, nausea, vomiting, or diarrhea. With that being said he does still have the blue-green drainage but this does not seem to be causing any significant issues currently. He has been using the gentamicin that does seem to be keeping things under decent control at this point. He goes later this morning for his next infusion therapy for the pyoderma which seems to also be very beneficial. 02/07/2020 on evaluation today patient appears to be doing about the same in regard to his wounds currently. Fortunately there is no signs of active infection  systemically he does still have evidence of local infection still using gentamicin. He also is showing some signs of improvement albeit slowly I do feel like we are making some progress here. 02/21/2020 upon evaluation today patient appears to be making some signs of improvement the wound is measuring a little bit smaller which is great news and overall I am very pleased with where he stands currently. He is going to be having infusion therapy treatment on the 15th of this month. Fortunately there is no signs of active infection at this time. 03/13/2020 I do believe patient's wound is actually showing some signs of improvement here which is great news. He has continue with the infusion therapy through rheumatology/dermatology at [facility]. That does seem to be beneficial. I still think he gets as much benefit from this as he did from the prednisone initially but nonetheless obviously this is less harsh on his body that the prednisone as far as they are concerned. 03/31/2020 on evaluation today patient's wound actually showing signs of some pretty good improvement in regard  to the overall appearance of the wound bed. There is still muscle exposed though he does have some epithelial growth around the edges of the wound. Fortunately there is no signs of active infection at this time. No fevers, chills, nausea, vomiting, or diarrhea. 04/24/2020 upon evaluation today patient appears to be doing about the same in regard to his leg ulcer. He has been tolerating the dressing changes without complication. Fortunately there is no signs of active infection at this time. No fevers, chills, nausea, vomiting, or diarrhea. With that being said he still has a lot of irritation from the bandaging around the edges of the wound. We did discuss today the possibility of a referral to plastic surgery. 05/22/2020 on evaluation today patient appears to be doing well with regard to his wounds all things considered. He has not been able  to get the Chantix apparently there is a recall nurse that I was unaware of put out by [employer] involuntarily. Nonetheless for now I am and I have to do some research into what may be the best option for him to help with quitting in regard to smoking and we discussed that today. 06/26/2020 upon evaluation today patient appears to be doing well with regard to his wound from the standpoint of infection I do not see any signs of infection at this point. With that being said unfortunately he is still continuing to have issues with muscle exposure and again he is not having a whole lot of new skin growth unfortunately. There does not appear to be any signs of active infection at this time. No fevers, chills, nausea, vomiting, or diarrhea. 07/10/2020 upon evaluation today patient appears to be doing a little bit more poorly currently compared to where he was previous. I am concerned currently about an active infection that may be getting worse especially in light of the increased size and tenderness of the wound bed. No fevers, chills, nausea, vomiting, or diarrhea. 07/24/2020 upon evaluation today patient appears to be doing poorly in regard to his leg ulcer. He has been tolerating the dressing changes without complication but unfortunately is having a lot of discomfort. 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 Subsequently I think he is going require possibly IV antibiotics to get this under control. I am very concerned about the severity of his infection and the amount of discomfort he is having. 07/31/2020 upon evaluation today patient appears to be doing about the same in regard to his leg wound. He did see Dr. Megan Salon and Dr. Megan Salon is actually going to start him on IV antibiotics. He goes for the PICC line tomorrow. With that being said there do not have that run for 2 weeks and then see how things are doing and depending on how he  is progressing they may extend that a little longer. Nonetheless I am glad this is getting ready to be in place and definitely feel it may help the patient. In the meantime is been using mainly triamcinolone to the wound bed has an anti-inflammatory. 08/07/2020 on evaluation today patient appears to be doing well with regard to his wound compared even last week. In the interim he has gotten the PICC line placed and overall this seems to be doing excellent. There does not appear to be any evidence of infection which is great news systemically although locally of course has had the infection this appears to be improving with the use of the antibiotics. 08/14/2020 upon  evaluation today patient's wound actually showing signs of excellent improvement. Overall the irritation has significantly improved the drainage is back down to more of a normal level and his pain is really pretty much nonexistent compared to what it was. Obviously I think that this is significantly improved secondary to the IV antibiotic therapy which has made all the difference in the world. Again he had a resistant form of Pseudomonas for which oral antibiotics just was not cutting it. Nonetheless I do think that still we need to consider the possibility of a surgical closure for this wound is been open so long and to be honest with muscle exposed I think this can be very hard to get this to close outside of this although definitely were still working to try to do what we can in that regard. 08/21/2020 upon evaluation today patient appears to be doing very well with regard to his wounds on the left lateral lower extremity/calf area. Fortunately there does not appear to be signs of active infection which is great news and overall very pleased with where things stand today. He is actually wrapping up his treatment with IV antibiotics tomorrow. After that we will see where things go from there. 08/28/2020 upon evaluation today patient appears  to be doing decently well with regard to his leg ulcer. There does not appear to be any signs of active infection which is great news and overall very pleased with where things stand today. No fevers, chills, nausea, vomiting, or diarrhea. 09/18/2020 upon evaluation today patient appears to be doing well with regard to his infection which I feel like is better. Unfortunately he is not doing as well with regard to the overall size of the wound which is not nearly as good at this point. I feel like that he may be having an issue here with the pyoderma being somewhat out of control. I think that he may benefit from potentially going back and talking to the dermatologist about TASHA, JINDRA ([id number]) what to do from the pyoderma standpoint. I am not certain if the infusions are helping nearly as much is what the prednisone did in the past. 10/02/2020 upon evaluation today patient appears to be doing well with regard to his leg ulcer. He did go to the Psychiatric nurse. Unfortunately they feel like there is a 10% chance that most that he would be able to heal and that the skin graft would take. Obviously this has led him to not be able to go down that path as far as treatment is concerned. Nonetheless he does seem to be doing a little bit better with the prednisone that I gave him last time. I think that he may need to discuss with dermatology the possibility of long-term prednisone as that seems to be what is most helpful for him to be perfectly honest. I am not sure the Remicade is really doing the job. 10/17/2020 upon evaluation today patient appears to be doing a little better in regard to his wound. In fact the case has been since we did the prednisone on May 2 for him that we have noticed a little bit of improvement each time we have seen a size wise as well as appearance wise as well as pain wise. I think the prednisone has had a greater effect then the infusion therapy has to be perfectly honest. With  that being said the patient also feels significantly better compared to what he was previous. All of this is good news but  nonetheless I am still concerned about the fact that again we are really not set up to long-term manage him as far as prednisone is concerned. Obviously there are things that you need to be watched I completely understand the risk of prednisone usage as well. 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 With all that being said I do believe that we can give him another round of the prednisone which she is requesting today because of the improvement that he seen since we did that first round. 10/30/2020 upon evaluation today patient's wound actually is showing signs of doing quite well. There does not appear to be any evidence of infection which is great news and overall very pleased with where things stand today. No fevers, chills, nausea, vomiting, or diarrhea. He tells me that the prednisone still has seem to have helped he wonders if we can extend that for just a little bit longer. He did not have the appointment with a dermatologist although he did have an infusion appointment last Friday. That was at [facility]. With that being said he tells me he could not do both that as well as the appointment with the physician on the same day therefore that is can have to be rescheduled. I really want to see if there is anything they feel like that could be done differently to try to help this out as I am not really certain that the infusions are helping significantly here. 11/13/2020 upon evaluation today patient unfortunately appears to be doing somewhat poorly in regard to his wound I feel like this is actually worsening from the standpoint of the pyoderma spreading. I still feel like that he may need something different as far as trying to manage this going forward. Again we did the prednisone unfortunately his blood sugars are not doing so well  because of this. Nonetheless I believe that the patient likely needs to try topical steroid. We have done triamcinolone for a while I think going with something stronger such as clobetasol could be beneficial again this is not something I do lightly I discussed this with the patient that again this does not normally put underneath an occlusive dressing. Nonetheless I think a thin film as such could help with some of the stronger anti-inflammatory effects. We discussed this today. He would like to try to give this a trial for the next couple weeks. I definitely think that is something that we can do. Evaluate7/03/2021 and today patient's wound bed actually showed signs of doing really about the same. There was a little expansion of the size of the wound and that leading edge that we done looking out although the clobetasol does seem to have slowed this down a bit in my opinion. There is just 1 small area that still seems to be progressing based on what I see. Nonetheless I am concerned about the fact this does not seem to be improving if anything seems to be doing a little bit worse. I do not know that the infusions are really helping him much as next infusion is August 5 his appointment with dermatology is July 25. Either way I really think that we need to have a conversation potentially about this and I am actually going to see if I can talk with Dr. Lillia Carmel in order to see where things stand as well. 12/11/2020 upon evaluation today patient appears to be doing worse in regard to his leg ulcer. Unfortunately  I just do not think this is making the progress that I would like to see at this point. Honestly he does have an appointment with dermatology and this is in 2 days. I am wondering what they may have to offer to help with this. Right now what I am seeing is that he is continuing to show signs of worsening little by little. Obviously that is not great at all. Is the exact opposite of what we are  looking for. 12/18/2020 upon evaluation today patient appears to be doing a little better in regard to his wound. The dermatologist actually did do some steroid injections into the wound which does seem to have been beneficial in my opinion. That was on the 25th already this looks a little better to me than last time I saw him. With that being said we did do a culture and this did show that he has Staph aureus noted in abundance in the wound. With that being said I do think that getting him on an oral antibiotic would be appropriate as well. Also think we can compression wrap and this will make a difference as well. 12/28/2020 upon evaluation today patient's wound is actually showing signs of doing much better. I do believe the compression wrap is helping he has a lot of drainage but to be honest I think that the compression is helping to some degree in this regard as well as not draining through which is also good news. No fevers, chills, nausea, vomiting, or diarrhea. 01/04/2021 upon evaluation today patient appears to be doing well with regard to his wound. Overall things seem to be doing quite well. He did have a little bit of reaction to the CarboFlex Sorbact he will be using that any longer. With that being said he is controlled as far as the drainage is concerned overall and seems to be doing quite well. I do not see any signs of active infection at this time which is great news. No fevers, chills, nausea, vomiting, or diarrhea. Electronic Signature(s) Signed: 01/04/2021 8:59:25 AM By: Worthy Keeler PA-C Entered By: Worthy Keeler on 01/04/2021 08:59:25 [name], [name] ([id number]) -------------------------------------------------------------------------------- Physical Exam Details Patient Name: [name] Date of Service: 01/04/2021 8:15 AM Medical Record Number: [id number] Patient Account Number: [id number] Date of Birth/Sex: Oct 15, 1980 (39 y.o. M) Treating RN: Dolan Amen Primary Care  Provider: [name] Other Clinician: Referring Provider: [name] Treating Provider/Extender: [name] Cliche in Treatment: 213 Constitutional Well-nourished and well-hydrated in no acute distress. Respiratory normal breathing without difficulty. Psychiatric this patient is able to make decisions and demonstrates good insight into disease process. Alert and Oriented x 3. pleasant and cooperative. Notes Upon inspection patient's wound bed showed signs of good granulation epithelization at this point. Fortunately there does not appear to be any evidence of infection which is great news and overall I am extremely pleased with where things stand at this point. No fevers, chills, nausea, vomiting, or diarrhea. Electronic Signature(s) Signed: 01/04/2021 8:59:41 AM By: Worthy Keeler PA-C Entered By: Worthy Keeler on 01/04/2021 08:59:40 [name], [name] ([id number]) -------------------------------------------------------------------------------- Physician Orders Details Patient Name: [name] Date of Service: 01/04/2021 8:15 AM Medical Record Number: [id number] Patient Account Number: [id number] Date of Birth/Sex: 1980-11-18 (39 y.o. 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 - Next Thursday o Nurse Visit as needed - Monday Bathing/ Shower/ Hygiene o  Clean wound with Normal Saline or wound cleanser. Edema Control - Lymphedema / Segmental Compressive Device / Other o Optional: One layer of unna paste to top of compression wrap (to act as an anchor). o 4 Layer Compression System Lymphedema. o Other: - Call [facility] for knee high compression socks 20/71m/Hg Medications-Please add to medication list. o P.O. Antibiotics Wound Treatment Wound #1 - Lower Leg Wound Laterality: Left, Lateral Cleanser: Soap and Water 2 x Per Week/30 Days Discharge Instructions: Gently cleanse wound with antibacterial soap, rinse and pat dry prior to dressing wounds Topical: Triamcinolone Acetonide Cream, 0.1%, 15 (g) tube 2 x Per Week/30 Days Discharge Instructions: Apply in office only to 7 o'clock area Primary Dressing: Aquacel Extra Hydrofiber Dressing, 4x5 (in/in) 2 x Per Week/30 Days Discharge Instructions: Apply on wound bed Secondary Dressing: Gauze 2 x Per Week/30 Days Discharge Instructions: Cover alginate with dry gauze Secondary Dressing: Xtrasorb Medium 4x5 (in/in) 2 x Per Week/30 Days Discharge Instructions: Apply to wound as directed. Do not cut. Compression Wrap: Medichoice 4 layer Compression System, 35-40 mmHG (Generic) 2 x Per Week/30 Days Discharge Instructions: Apply multi-layer wrap as directed. Patient Medications Allergies: milk, Biaxin, seasonal Notifications Medication Indication Start End Bactrim DS 01/04/2021 DOSE 1 - oral 800 mg-160 mg tablet - 1 tablet oral taken 2 times per day for 14 days LJAHEEM, HEDGEPATH([id number] Electronic Signature(s) Signed: 01/04/2021 4:25:15 PM By: SWorthy KeelerPA-C Entered By: SWorthy Keeleron 01/04/2021 16:25:14 Rodriges, RHerbie Baltimore([id number] -------------------------------------------------------------------------------- Problem List Details Patient Name: LHaynes KernsDate of Service: 01/04/2021 8:15 AM Medical Record Number: [id number] Account Number: [id number] of  Birth/Sex: [birth year](39 y.o. 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 Yes L97.321 Non-pressure chronic ulcer of left ankle limited to breakdown of skin 10/08/2019 No Yes I87.2 Venous insufficiency (chronic) (peripheral) 12/04/2016 No Yes L03.116 Cellulitis of left lower limb 05/24/2019 No Yes F17.208 Nicotine dependence, unspecified, with other nicotine-induced disorders 04/24/2020 No Yes Inactive Problems ICD-10 Code Description Active Date Inactive Date L97.213 Non-pressure chronic ulcer of right calf with necrosis of muscle 04/02/2017 04/02/2017 Resolved Problems Electronic Signature(s) Signed: 01/04/2021 8:22:24 AM By: SWorthy KeelerPA-C Entered By: SWorthy Keeleron 01/04/2021 08:22:24 LHaynes [name]([id number] -------------------------------------------------------------------------------- Progress Note Details Patient Name: LHaynes KernsDate of Service: 01/04/2021 8:15 AM Medical Record Number: [id number] Account Number: [id number] of Birth/Sex: 919-Jan-1982(39 y.o. M) Treating RN: SDolan AmenPrimary Care Provider: CAlma FriendlyOther Clinician: Referring Provider: CAlma FriendlyTreating Provider/Extender: SSkipper Clichein Treatment: 213 Subjective Chief Complaint Information obtained from Patient He is here in follow up evaluation for LLE pyoderma ulcer History of Present Illness (HPI) 12/04/16; 40year old man who comes into the clinic today for review of a wound on the posterior left calf. He tells me that is been there for about a year. He is not a diabetic he  does smoke half a pack per day. He was  seen in the ER on 11/20/16 felt to have cellulitis around the wound and was given clindamycin. An x-ray did not show osteomyelitis. The patient initially tells me that he has a milk allergy that sets off a pruritic itching rash on his lower legs which she scratches incessantly and he thinks that's what may have set up the wound. He has been using various topical antibiotics and ointments without any effect. He works in a [employer] and is on his feet all day. He does not have a prior history of wounds however he does have the rash on both lower legs the right arm and the ventral aspect of his left arm. These are excoriations and clearly have had scratching however there are of macular looking areas on both legs including a substantial larger area on the right leg. This does not have an underlying open area. There is no blistering. The patient tells me that 2 years ago in [place] in response to the rash on his legs he saw a dermatologist who told him he had a condition which may be pyoderma gangrenosum although I may be putting words into his mouth. He seemed to recognize this. On further questioning he admits to a 5 year history of quiesced. ulcerative colitis. He is not in any treatment for this. He's had no recent travel 12/11/16; the patient arrives today with his wound and roughly the same condition we've been using silver alginate this is a deep punched out wound with some surrounding erythema but no tenderness. Biopsy I did did not show confirmed pyoderma gangrenosum suggested nonspecific inflammation and vasculitis but does not provide an actual description of what was seen by the pathologist. I'm really not able to understand this We have also received information from the patient's dermatologist in [place] notes from April 2016. This was a doctor Agarwal-antal. 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 He was prescribed topical steroid high potency under occlusion which helped but  at this point the patient did not have a deep punched out wound. 12/18/16; the patient's wound is larger in terms of surface area however this surface looks better and there is less depth. The surrounding erythema also is better. The patient states that the wrap we put on came off 2 days ago when he has been using his compression stockings. He we are in the process of getting a dermatology consult. 12/26/16 on evaluation today patient's left lower extremity wound shows evidence of infection with surrounding erythema noted. He has been tolerating the dressing changes but states that he has noted more discomfort. There is a larger area of erythema surrounding the wound. No fevers, chills, nausea, or vomiting noted at this time. With that being said the wound still does have slough covering the surface. He is not allergic to any medication that he is aware of at this point. In regard to his right lower extremity he had several regions that are erythematous and pruritic he wonders if there's anything we can do to help that. 01/02/17 I reviewed patient's wound culture which was obtained his visit last week. He was placed on doxycycline at that point. Unfortunately that does not appear to be an antibiotic that would likely help with the situation however the pseudomonas noted on culture is sensitive to Cipro. Also unfortunately patient's wound seems to have a large compared to last week's evaluation. Not severely so but there are definitely increased measurements in general.  He is continuing to have discomfort as well he writes this to be a seven out of 10. In fact he would prefer me not to perform any debridement today due to the fact that he is having discomfort and considering he has an active infection on the little reluctant to do so anyway. No fevers, chills, nausea, or vomiting noted at this time. 01/08/17; patient seems dermatology on September 5. I suspect dermatology will want the slides from the  biopsy I did sent to their pathologist. I'm not sure if there is a way we can expedite that. In any case the culture I did before I left on vacation 3 weeks ago showed Pseudomonas he was given 10 days of Cipro and per her description of her intake nurses is actually somewhat better this week although the wound is quite a bit bigger than I remember the last time I saw this. He still has 3 more days of Cipro 01/21/17; dermatology appointment tomorrow. He has completed the ciprofloxacin for Pseudomonas. Surface of the wound looks better however he is had some deterioration in the lesions on his right leg. Meantime the left lateral leg wound we will continue with sample 01/29/17; patient had his dermatology appointment but I can't yet see that note. He is completed his antibiotics. The wound is more superficial but considerably larger in circumferential area than when he came in. This is in his left lateral calf. He also has swollen erythematous areas with superficial wounds on the right leg and small papular areas on both arms. There apparently areas in her his upper thighs and buttocks I did not look at those. Dermatology biopsied the right leg. Hopefully will have their input next week. 02/05/17; patient went back to see his dermatologist who told him that he had a "scratching problem" as well as staph. He is now on a 30 day course of doxycycline and I believe she gave him triamcinolone cream to the right leg areas to help with the itching [not exactly sure but probably triamcinolone]. She apparently looked at the left lateral leg wound although this was not rebiopsied and I think felt to be ultimately part of the same pathogenesis. He is using sample border foam and changing nevus himself. He now has a new open area on the right posterior leg which was his biopsy site I don't have any of the dermatology notes 02/12/17; we put the patient in compression last week with SANTYL to the wound on the left leg  and the biopsy. Edema is much better and the depth of the wound is now at level of skin. Area is still the same Biopsy site on the right lateral leg we've also been using santyl with a border foam dressing and he is changing this himself. 02/19/17; Using silver alginate started last week to both the substantial left leg wound and the biopsy site on the right wound. He is tolerating compression well. Has a an appointment with his primary M.D. tomorrow wondering about diuretics although I'm wondering if the edema problem is actually lymphedema KAMARI, [name] ([id number]) 02/26/17; the patient has been to see his primary doctor Dr. Jerrel Ivory at [facility] our primary care. She started him on Lasix 20 mg and this seems to have helped with the edema. However we are not making substantial change with the left lateral calf wound and inflammation. The biopsy site on the right leg also looks stable but not really all that different. 03/12/17; the patient has been to see vein  and vascular Dr. Lucky Cowboy. He has had venous reflux studies I have not reviewed these. I did get a call from his dermatology office. 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 They therefore recommended clobetasol and tetracycline i.e. 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 03/26/17; apparently the patient just had reflux studies not an appointment with Dr. dew. She arrives in clinic today having applied clobetasol for 2-3 weeks. He notes over the last 2-3 days excessive drainage having to change the dressing 3-4 times a day and also expanding erythema. He states the expanding erythema seems to come and go and was last this red was earlier in the month.he is on  doxycycline 150 mg twice a day as an anti-inflammatory systemic therapy for possible pyoderma gangrenosum along with the topical clobetasol 04/02/17; the patient was seen last week by Dr. Lillia Carmel at [facility] dermatology locally who kindly saw him at my request. 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 My concern was the degree of erythema medially extending from his left leg wound which was either inflammation from pyoderma or cellulitis. I put him on Augmentin however culture of the wound showed Pseudomonas which is quinolone sensitive. I really don't believe he has cellulitis however in view of everything I will continue and give him a course of Cipro. He is also on doxycycline as an immune modulator for the pyoderma. In addition to his original wound on the left lateral leg with surrounding erythema he has a wound on the right posterior calf which was an original biopsy site done by dermatology. 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 Saw Dr. Lillia Carmel yesterday. He has been using topical antibiotics to both wound areas his original wound on the left and the biopsies/pathergy area on the right. There is definitely some improvement in the inflammation around the wound on the right although the patient states he has increasing sensitivity of the wounds. He is on prednisone 60 and doxycycline 1 as prescribed by Dr. Lillia Carmel. He is covering the topical antibiotic with gauze and putting this in his own compression stocks and changing this daily. He states that Dr. Lottie Rater did a culture of the left leg wound yesterday 05/07/17; pyoderma gangrenosum. The patient saw Dr. Lillia Carmel yesterday and has a follow-up with her in one month. He is still using topical antibiotics to both wounds although he can't recall exactly what type. He is still on prednisone 60 mg. Dr. Lillia Carmel stated  that the doxycycline could stop if we were in agreement. 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 The right medial leg was induced by biopsy/pathergy. The area on the right is essentially healed. 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 The right medial leg has closed and remains closed. He is still on prednisone 60. He tells me he missed his last dermatology appointment with Dr. Lillia Carmel but will make another appointment. He reports that her blood sugar at a recent screen in [place] was high 200's. He was 180  today. 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 I think he is going to require still much longer prednisone perhaps another 3 months before attempting to taper. In the meantime his wound is a lot better. Smaller. He is cleaning this off daily and applying topical antibiotics. When he was last in the clinic I thought about changing to [facility] and actually put in a couple of calls to dermatology although probably not during their business hours. 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 He continues on oral prednisone. He has been using triple antibiotic ointment. There is surface debris and we will transition to [facility] and have him return in 2 weeks. He has lost 30 pounds since his last appointment with lifestyle modification. He may benefit from topical steroid cream for treatment this can be considered at a later date. 08/22/17 on evaluation today patient appears to actually be doing rather well in regard to his left lateral lower extremity ulcer. He has actually been managed by Dr. Dellia Nims most recently. Patient is currently on oral steroids at this time.  This seems to have been of benefit for him. Nonetheless his last visit was actually with Leah on 08/06/17. Currently he is not utilizing any topical steroid creams although this could be of benefit as well. No fevers, chills, nausea, or vomiting noted at this time. 09/05/17 on evaluation today patient appears to be doing better in regard to his left lateral lower extremity ulcer. He has been tolerating the dressing changes without complication. He is using Santyl with good effect. Overall I'm very pleased with how things are standing at this point. Patient likewise is happy that this is doing better. 09/19/17 on evaluation today patient actually appears to be doing rather well in regard to his left lateral lower extremity ulcer. 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 He's not having any significant pain. 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 He has been tolerating the Santyl without complication and in general I feel like he's making good progress. 10/17/17 on evaluation today patient appears to be doing very well in regard to his left lateral lower surety ulcer. He has been tolerating the dressing changes without complication. There does not appear to be any evidence of infection he's alternating the Santyl and the triple antibiotic ointment every other day this seems to be doing well for him. 11/03/17 on evaluation today patient appears to be doing very well in regard to his left lateral lower extremity ulcer. He is been tolerating the dressing changes without complication which is good news. Fortunately there does not appear to be any evidence of infection which is also great news. Overall is doing excellent they are starting to taper down on the prednisone is down to 40 mg at this point it also started topical  clobetasol for him. 11/17/17 on evaluation today patient appears to be doing well in regard to his left lateral lower surety ulcer. He's been tolerating the dressing changes without complication. He does note that he is having no pain, no excessive drainage or discharge, and overall he feels like things are going about how he would expect and hope they would. Overall  he seems to have no evidence of infection at this time in my opinion which is good news. 12/04/17-He is seen in follow-up evaluation for right lateral lower extremity ulcer. He has been applying topical steroid cream. Today's measurement show slight increase in size. Over the next 2 weeks we will transition to every other day Santyl and steroid cream. He has been encouraged to monitor for changes and notify clinic with any concerns 12/15/17 on evaluation today patient's left lateral motion the ulcer and fortunately is doing worse again at this point. This just since last week to this week has close to doubled in size according to the patient. I did not seeing last week's I do not have a visual to compare this to in our system was also down so we do not have all the charts and at this point. Nonetheless it does have me somewhat concerned in regard to the fact that again he was worried enough about it he has contact the dermatology that placed them back on the full strength, 50 mg a day of the prednisone that he was taken previous. He continues to alternate using clobetasol along with Santyl at this point. He is obviously somewhat frustrated. [name], [name] ([id number]) 12/22/17 on evaluation today patient appears to be doing a little worse compared to last evaluation. Unfortunately the wound is a little deeper and slightly larger than the last week's evaluation. With that being said he has made some progress in regard to the irritation surrounding at this time unfortunately despite that progress that's been made he still has a significant issue  going on here. 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 For that reason I discussed with him today the possibility of perform a wound culture to ensure there's nothing overtly infected. 01/06/18 on evaluation today patient's wound is larger and deeper than previously evaluated. With that being said it did appear that his wound was infected after my last evaluation with him. Subsequently I did end up prescribing a prescription for Bactrim DS which she has been taking and having no complication with. Fortunately there does not appear to be any evidence of infection at this point in time as far as anything spreading, no want to touch, and overall I feel like things are showing signs of improvement. 01/13/18 on evaluation today patient appears to be even a little larger and deeper than last time. There still muscle exposed in the base of the wound. Nonetheless he does appear to be less erythematous I do believe inflammation is calming down also believe the infection looks like it's probably resolved at this time based on what I'm seeing. No fevers, chills, nausea, or vomiting noted at this time. 01/30/18 on evaluation today patient actually appears to visually look better for the most part. Unfortunately those visually this looks better he does seem to potentially have what may be an abscess in the muscle that has been noted in the central portion of the wound. This is the first time that I have noted what appears to be fluctuance in the central portion of the muscle. With that being said I'm somewhat more concerned about the fact that this might indicate an abscess formation at this location. I do believe that an ultrasound would be appropriate. This is likely something we need to try to do as soon as possible. He has been switch to  mupirocin ointment and  he is no longer using the steroid ointment as prescribed by dermatology he sees them again next week he's been decreased from 60 to 40 mg of prednisone. 03/09/18 on evaluation today patient actually appears to be doing a little better compared to last time I saw him. There's not as much erythema surrounding the wound itself. He I did review his most recent infectious disease note which was dated 02/24/18. He saw Dr. Michel Bickers in [place]. 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 Patient is now off of antibiotics and they are continually observing this. There seems to be no change in the past two weeks in my pinion based on what the patient says and what I see today compared to what Dr. Megan Salon likely saw two weeks ago. No fevers, chills, nausea, or vomiting noted at this time. 03/23/18 on evaluation today patient's wound actually appears to be showing signs of improvement which is good news. He is currently still on the Dapsone. He is also working on tapering the prednisone to get off of this and Dr. Lottie Rater is working with him in this regard. Nonetheless overall I feel like the wound is doing well it does appear based on the infectious disease note that I reviewed from Dr. 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 I did also review the results of his ultrasound of left lower extremity which revealed there was a dentist tissue in the base of the wound without an abscess noted. 04/06/18 on evaluation today the patient's left lateral lower extremity ulcer actually appears to be doing fairly well which is excellent news. There does not appear to be any evidence of infection at this time which is also great news. Overall he still does have a significantly large ulceration although little by  little he seems to be making progress. He is down to 10 mg a day of the prednisone. 04/20/18 on evaluation today patient actually appears to be doing excellent at this time in regard to his left lower extremity ulcer. He's making signs of good progress unfortunately this is taking much longer than we would really like to see but nonetheless he is making progress. Fortunately there does not appear to be any evidence of infection at this time. No fevers, chills, nausea, or vomiting noted at this time. The patient has not been using the Santyl due to the cost he hadn't got in this field yet. He's mainly been using the antibiotic ointment topically. Subsequently he also tells me that he really has not been scrubbing in the shower I think this would be helpful again as I told him it doesn't have to be anything too aggressive to even make it believe just enough to keep it free of some of the loose slough and biofilm on the wound surface. 05/11/18 on evaluation today patient's wound appears to be making slow but sure progress in regard to the left lateral lower extremity ulcer. He is been tolerating the dressing changes without complication. Fortunately there does not appear to be any evidence of infection at this time. He is still just using triple antibiotic ointment along with clobetasol occasionally over the area. He never got the Santyl and really does not seem to intend to in my pinion. 06/01/18 on evaluation  today patient appears to be doing a little better in regard to his left lateral lower extremity ulcer. He states that overall he does not feel like he is doing as well with the Dapsone as he did with the prednisone. Nonetheless he sees his dermatologist later today and is gonna talk to them about the possibility of going back on the prednisone. Overall again I believe that the wound would be better if you would utilize Santyl but he really does not seem to be interested in going back to the [place] at  this point. He has been using triple antibiotic ointment. 06/15/18 on evaluation today patient's wound actually appears to be doing about the same at this point. Fortunately there is no signs of infection at this time. He has made slight improvements although he continues to not really want to clean the wound bed at this point. He states that he just doesn't mess with it he doesn't want to cause any problems with everything else he has going on. He has been on medication, antibiotics as prescribed by his dermatologist, for a staff infection of his lower extremities which is really drying out now and looking much better he tells me. Fortunately there is no sign of overall infection. 06/29/18 on evaluation today patient appears to be doing well in regard to his left lateral lower surety ulcer all things considering. Fortunately his staff infection seems to be greatly improved compared to previous. He has no signs of infection and this is drying up quite nicely. He is still the doxycycline for this is no longer on cental, Dapsone, or any of the other medications. His dermatologist has recommended possibility of an infusion but right now he does not want to proceed with that. 07/13/18 on evaluation today patient appears to be doing about the same in regard to his left lateral lower surety ulcer. Fortunately there's no signs of infection at this time which is great news. Unfortunately he still builds up a significant amount of Slough/biofilm of the surface of the wound he still is not really cleaning this as he should be appropriately. Again I'm able to easily with saline and gauze remove the majority of this on the surface which if you would do this at home would likely be a dramatic improvement for him as far as getting the area to improve. Nonetheless overall I still feel like he is making progress is just very slow. I think Santyl will be of benefit for him as well. Still he has not gotten this as of this  point. 07/27/18 on evaluation today patient actually appears to be doing little worse in regards of the erythema around the periwound region of the wound he also tells me that he's been having more drainage currently compared to what he was experiencing last time I saw him. He states not quite as bad as what he had because this was infected previously but nonetheless is still appears to be doing poorly. Fortunately there is no evidence of systemic infection at this point. The patient tells me that he is not going to be able to afford the Santyl. He is still waiting to hear about the infusion therapy with his dermatologist. Apparently she wants an updated colonoscopy first. [name], [name] ([id number]) 08/10/18 on evaluation today patient appears to be doing better in regard to his left lateral lower extremity ulcer. Fortunately he is showing signs of improvement in this regard he's actually been approved for Remicade infusion's as well although this has not  been scheduled as of yet. Fortunately there's no signs of active infection at this time in regard to the wound although he is having some issues with infection of the right lower extremity is been seen as dermatologist for this. Fortunately they are definitely still working with him trying to keep things under control. 09/07/18 on evaluation today patient is actually doing rather well in regard to his left lateral lower extremity ulcer. He notes these actually having some hair grow back on his extremity which is something he has not seen in years. He also tells me that the pain is really not giving them any trouble at this time which is also good news overall she is very pleased with the progress he's using a combination of the mupirocin along with the probate is all mixed. 09/21/18 on evaluation today patient actually appears to be doing fairly well all things considered in regard to his looks from the ulcer. He's been tolerating the dressing changes  without complication. Fortunately there's no signs of active infection at this time which is good news he is still on all antibiotics or prevention of the staff infection. He has been on prednisone for time although he states it is gonna contact his dermatologist and see if she put them on a short course due to some irritation that he has going on currently. Fortunately there's no evidence of any overall worsening this is going very slow I think cental would be something that would be helpful for him although he states that $50 for tube is quite expensive. He therefore is not willing to get that at this point. 10/06/18 on evaluation today patient actually appears to be doing decently well in regard to his left lateral leg ulcer. He's been tolerating the dressing changes without complication. Fortunately there's no signs of active infection at this time. Overall I'm actually rather pleased with the progress he's making although it's slow he doesn't show any signs of infection and he does seem to be making some improvement. I do believe that he may need a switch up and dressings to try to help this to heal more appropriately and quickly. 10/19/18 on evaluation today patient actually appears to be doing better in regard to his left lateral lower extremity ulcer. This is shown signs of having much less Slough buildup at this point due to the fact he has been using the [employer]. Obviously this is very good news. The overall size of the wound is not dramatically smaller but again the appearance is. 11/02/18 on evaluation today patient actually appears to be doing quite well in regard to his lower [place] ulcer. A lot of the skin around the ulcer is actually somewhat irritating at this point this seems to be more due to the dressing causing irritation from the adhesive that anything else. Fortunately there is no signs of active infection at this time. 11/24/18 on evaluation today patient appears to be doing a little  worse in regard to his overall appearance of his lower extremity ulcer. There's more erythema and warmth around the wound unfortunately. He is currently on doxycycline which he has been on for some time. With that being said I'm not sure that seems to be helping with what appears to possibly be an acute cellulitis with regard to his left lower extremity ulcer. No fevers, chills, nausea, or vomiting noted at this time. 12/08/18 on evaluation today patient's wounds actually appears to be doing significantly better compared to his last evaluation. He has been  using Santyl along with alternating tripling about appointment as well as the steroid cream seems to be doing quite well and the wound is showing signs of improvement which is excellent news. Fortunately there's no evidence of infection and in fact his culture came back negative with only normal skin flora noted. 12/21/2018 upon evaluation today patient actually appears to be doing excellent with regard to his ulcer. This is actually the best that I have seen it since have been helping to take care of him. It is both smaller as well as less slough noted on the surface of the wound and seems to be showing signs of good improvement with new skin growing from the edges. He has been using just the triamcinolone he does wonder if he can get a refill of that ointment today. 01/04/2019 upon evaluation today patient actually appears to be doing well with regard to his left lateral lower extremity ulcer. With that being said it does not appear to be that he is doing quite as well as last time as far as progression is concerned. There does not appear to be any signs of infection or significant irritation which is good news. With that being said I do believe that he may benefit from switching to a collagen based dressing based on how clean The wound appears. 01/18/2019 on evaluation today patient actually appears to be doing well with regard to his wound on the  left lower extremity. He is not made a lot of progress compared to where we were previous but nonetheless does seem to be doing okay at this time which is good news. There is no signs of active infection which is also good news. My only concern currently is I do wish we can get him into utilizing the collagen dressing his insurance would not pay for the supplies that we ordered although it appears that he may be able to order this through his supply company that he typically utilizes. This is [employer]. Nonetheless he did try to order it during the office visit today and it appears this did go through. We will see if he can get that it is a different brand but nonetheless he has collagen and I do think will be beneficial. 02/01/2019 on evaluation today patient actually appears to be doing a little worse today in regard to the overall size of his wounds. Fortunately there is no signs of active infection at this time. That is visually. Nonetheless when this is happened before it was due to infection. For that reason were somewhat concerned about that this time as well. 02/08/2019 on evaluation today patient unfortunately appears to be doing slightly worse with regard to his wound upon evaluation today. Is measuring a little deeper and a little larger unfortunately. I am not really sure exactly what is causing this to enlarge he actually did see his dermatologist she is going to see about initiating Humira for him. Subsequently she also did do steroid injections into the wound itself in the periphery. Nonetheless still nonetheless he seems to be getting a little bit larger he is gone back to just using the steroid cream topically which I think is appropriate. I would say hold off on the collagen for the time being is definitely a good thing to do. Based on the culture results which we finally did get the final result back regarding it shows staph as the bacteria noted again that can be a normal skin bacteria  based on the fact however he  is having increased drainage and worsening of the wound measurement wise I would go ahead and place him on an antibiotic today I do believe for this. 02/15/2019 on evaluation today patient actually appears to be doing somewhat better in regard to his ulcer. There is no signs of worsening at this time I did review his culture results which showed evidence of Staphylococcus aureus but not MRSA. Again this could just be more related to the normal skin bacteria although he states the drainage has slowed down quite a bit he may have had a mild infection not just colonization. And was much smaller and then since around10/04/2019 on evaluation today patient appears to be doing unfortunately worse as far as the size of the wound. I really feel like that this is steadily getting larger again it had been doing excellent right at the beginning of September we have seen a steady increase in the area of the wound it is almost 2-1/2 times the size it was on September 1. Obviously this is a bad trend this is not wanting to see. For that reason we went back to using just the topical triamcinolone cream which does seem to help with inflammation. I checked him for bacteria by way of culture and nothing showed positive there. I am considering giving him a short course of a tapering steroid Pookela Sellin, Britian ([id number]) today to see if that is can be beneficial for him. The patient is in agreement with giving that a try. 03/08/2019 on evaluation today patient appears to be doing very well in comparison to last evaluation with regard to his lower extremity ulcer. This is showing signs of less inflammation and actually measuring slightly smaller compared to last time every other week over the past month and a half he has been measuring larger larger larger. Nonetheless I do believe that the issue has been inflammation the prednisone does seem to have been beneficial for him which is good  news. No fevers, chills, nausea, vomiting, or diarrhea. 03/22/2019 on evaluation today patient appears to be doing about the same with regard to his leg ulcer. He has been tolerating the dressing changes without complication. With that being said the wound seems to be mostly arrested at its current size but really is not making any progress except for when we prescribed the prednisone. He did show some signs of dropping as far as the overall size of the wound during that interval week. Nonetheless this is something he is not on long-term at this point and unfortunately I think he is getting need either this or else the Humira which his dermatologist has discussed try to get approval for. With that being said he will be seeing his dermatologist on the 11th of this month that is November. 04/19/2019 on evaluation today patient appears to be doing really about the same the wound is measuring slightly larger compared to last time I saw him. He has not been into the office since November 2 due to the fact that he unfortunately had Covid as that his entire family. He tells me that it was rough but they did pull-through and he seems to be doing much better. Fortunately there is no signs of active infection at this time. No fevers, chills, nausea, vomiting, or diarrhea. 05/10/2019 on evaluation today patient unfortunately appears to be doing significantly worse as compared to last time I saw him. He does tell me that he has had his first dose of Humira and actually is scheduled to  get the next one in the upcoming week. With that being said he tells me also that in the past several days he has been having a lot of issues with green drainage she showed me a picture this is more blue-green in color. He is also been having issues with increased sloughy buildup and the wound does appear to be larger today. Obviously this is not the direction that we want everything to take based on the starting of his Humira.  Nonetheless I think this is definitely a result of likely infection and to be honest I think this is probably Pseudomonas causing the infection based on what I am seeing. 05/24/2019 on evaluation today patient unfortunately appears to be doing significantly worse compared to his prior evaluation with me 2 weeks ago. I did review his culture results which showed that he does have Staph aureus as well as Pseudomonas noted on the culture. Nonetheless the Levaquin that I prescribed for him does not appear to have been appropriate and in fact he tells me he is no longer experiencing the green drainage and discharge that he had at the last visit. Fortunately there is no signs of active infection at this time which is good news although the wound has significantly worsened it in fact is much deeper than it was previous. We have been utilizing up to this point triamcinolone ointment as the prescription topical of choice but at this time I really feel like that the wound is getting need to be packed in order to appropriately manage this due to the deeper nature of the wound. Therefore something along the lines of an alginate dressing may be more appropriate. 05/31/2019 upon inspection today patient's wound actually showed signs of doing poorly at this point. Unfortunately he just does not seem to be making any good progress despite what we have tried. He actually did go ahead and pick up the Cipro and start taking that as he was noticing more green drainage he had previously completed the Levaquin that I prescribed for him as well. Nonetheless he missed his appointment for the seventh last week on Wednesday with the wound care center and [facility] where his dermatologist referred him. Obviously I do think a second opinion would be helpful at this point especially in light of the fact that the patient seems to be doing so poorly despite the fact that we have tried everything that I really know how at this  point. The only thing that ever seems to have helped him in the past is when he was on high doses of continual steroids that did seem to make a difference for him. 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 06/07/2019 upon evaluation today patient unfortunately appears to be doing worse yet again with regard to his wound. In fact I am starting to question whether or not he may have a fluid pocket in the muscle at this point based on the bulging and the soft appearance to the central portion of the muscle area. There is not anything draining from the muscle itself at this time which is good news but nonetheless the wound is expanding. I am not really seeing any results of the Humira as far as overall wound progression based on what I am seeing at this point. The patient  has been referred for second opinion with regard to his wound to the [facility] wound care center by his dermatologist which I definitely am not in opposition to. Unfortunately we tried multiple dressings in the past including collagen, alginate, and at one point even Hydrofera Blue. With that being said he is never really used it for any significant amount of time due to the fact that he often complains of pain associated with these dressings and then will go back to either using the Santyl which she has done intermittently or more frequently the triamcinolone. He is also using his own compression stockings. We have wrapped him in the past but again that was something else that he really was not a big fan of. 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 He seemed to do much better when he was on the steroids although honestly I understand that the steroids are not necessarily the best medication to be on long-term obviously 06/14/2019 on evaluation today patient appears to be doing actually a little bit better with regard to the overall appearance with his leg. Unfortunately he does continue to have issues with what appears to be some fluid underneath the muscle although he did see the wound specialty center at [facility] last week their main goals were to see about infusion therapy in place of the Humira as they feel like that is not quite strong enough. They also recommended that we continue with the treatment otherwise as we are they felt like that was appropriate and they are okay with him continuing to follow-up here with [place] in that regard. With that being said they are also sending him to the vein specialist there to see about vein stripping and if that would be of benefit for him. Subsequently they also did not really address whether or not an ultrasound of the muscle area to see if there is anything that needs to be addressed here would be appropriate or not. For that reason I discussed this with him last week I think we may proceed down that road at this point. 06/21/2019 upon evaluation today patient's wound actually appears to be doing slightly better compared to previous evaluations. I do believe that he has made a difference with regard to the progression here with the use of oral steroids. Again in the past has been the only thing that is really calm things down. He does tell me that from [facility] is gotten a good news from there that there are no further vein stripping that is necessary at this point. I do not have that available for review today although the patient did relay this to me. He also did obtain and have the ultrasound of the wound completed which I did  sign off on today. It does appear that there is no fluid collection under the muscle this is likely then just edematous tissue in general. That is also good news. Overall I still believe the inflammation is the main issue here. He did inquire about the possibility of a wound VAC again with the muscle protruding like it is I am not really sure whether the wound VAC is necessarily  ideal or not. That is something we will have to consider although I do believe he may need compression wrapping to try to help with edema control which could potentially be of benefit. 06/28/2019 on evaluation today patient appears to be doing slightly better measurement wise although this is not terribly smaller he least seems to be trending towards that direction. With that being said he still seems to have purulent drainage noted in the wound bed at this time. He has been on Levaquin followed by Cipro over the past month. Unfortunately he still seems to have some issues with active infection at this time. I did perform a culture last week in order to evaluate and see if indeed there was still anything going on. Subsequently the culture did come back Makin, Miranda ([id number]) showing Pseudomonas which is consistent with the drainage has been having which is blue-green in color. He also has had an odor that again was somewhat consistent with Pseudomonas as well. Long story short it appears that the culture showed an intermediate finding with regard to how well the Cipro will work for the Pseudomonas infection. Subsequently being that he does not seem to be clearing up and at best what we are doing is just keeping this at [place] I think he may need to see infectious disease to discuss IV antibiotic options. 07/05/2019 upon evaluation today patient appears to be doing okay in regard to his leg ulcer. He has been tolerating the dressing changes at this point without complication. Fortunately there is no signs of active infection at  this time which is good news. No fevers, chills, nausea, vomiting, or diarrhea. With that being said he does have an appointment with infectious disease tomorrow and his primary care on Wednesday. Again the reason for the infectious disease referral was due to the fact that he did not seem to be fully resolving with the use of oral antibiotics and therefore we were thinking that IV antibiotic therapy may be necessary secondary to the fact that there was an intermediate finding for how effective the Cipro may be. Nonetheless again he has been having a lot of purulent and even green drainage. Fortunately right now that seems to have calmed down over the past week with the reinitiation of the oral antibiotic. Nonetheless we will see what Dr. Megan Salon has to say. 07/12/2019 upon evaluation today patient appears to be doing about the same at this point in regard to his left lower extremity ulcer. Fortunately there is no signs of active infection at this time which is good news I do believe the Levaquin has been beneficial I did review Dr. Hale Bogus note and to be honest I agree that the patient's leg does appear to be doing better currently. What we found in the past as he does not seem to really completely resolve he will stop the antibiotic and then subsequently things will revert back to having issues with blue-green drainage, increased pain, and overall worsening in general. Obviously that is the reason I sent him back to infectious disease. 07/19/2019 upon evaluation today patient appears to be doing roughly the same in size there is really no dramatic improvement. He has started back on the Levaquin at this point and though he seems to be doing okay he did still have a lot of blue/green drainage noted on evaluation today unfortunately. I think that this is still indicative more likely of a Pseudomonas infection as previously noted and again he does see Dr. Megan Salon in just a  couple of days. I do not  know that were really able to effectively clear this with just oral antibiotics alone based on what I am seeing currently. Nonetheless we are still continue to try to manage as best we can with regard to the patient and his wound. I do think the wrap was helpful in decreasing the edema which is excellent news. No fevers, chills, nausea, vomiting, or diarrhea. 07/26/2019 upon evaluation today patient appears to be doing slightly better with regard to the overall appearance of the muscle there is no dark discoloration centrally. Fortunately there is no signs of active infection at this time. No fevers, chills, nausea, vomiting, or diarrhea. Patient's wound bed currently the patient did have an appointment with Dr. Megan Salon at infectious disease last week. With that being said Dr. Megan Salon the patient states was still somewhat hesitant about put him on any IV antibiotics he wanted [place] to repeat cultures today and then see where things go going forward. He does look like Dr. Megan Salon because of some improvement the patient did have with the Levaquin wanted [place] to see about repeating cultures. If it indeed grows the Pseudomonas again then he recommended a possibility of considering a PICC line placement and IV antibiotic therapy. He plans to see the patient back in 1 to 2 weeks. 08/02/2019 upon evaluation today patient appears to be doing poorly with regard to his left lower extremity. We did get the results of his culture back it shows that he is still showing evidence of Pseudomonas which is consistent with the purulent/blue-green drainage that he has currently. Subsequently the culture also shows that he now is showing resistance to the oral fluoroquinolones which is unfortunate as that was really the only thing to treat the infection prior. I do believe that he is looking like this is going require IV antibiotic therapy to get this under control. Fortunately there is no signs of systemic infection at this  time which is good news. The patient does see Dr. Megan Salon tomorrow. 08/09/2019 upon evaluation today patient appears to be doing better with regard to his left lower extremity ulcer in regard to the overall appearance. He is currently on IV antibiotic therapy. As ordered by Dr. Megan Salon. Currently the patient is on ceftazidime which she is going to take for the next 2 weeks and then follow-up for 4 to 5-week appointment with Dr. Megan Salon. The patient started this this past Friday symptoms have not for a total of 3 days currently in full. 08/16/2019 upon evaluation today patient's wound actually does show muscle in the base of the wound but in general does appear to be much better as far as the overall evidence of infection is concerned. In fact I feel like this is for the most part cleared up he still on the IV antibiotics he has not completed the full course yet but I think he is doing much better which is excellent news. 08/23/2019 upon evaluation today patient appears to be doing about the same with regard to his wound at this point. He tells me that he still has pain unfortunately. Fortunately there is no evidence of systemic infection at this time which is great news. There is significant muscle protrusion. 09/13/19 upon evaluation today patient appears to be doing about the same in regard to his leg unfortunately. He still has a lot of drainage coming from the ulceration there is still muscle exposed. With that being said the patient's last wound culture still showed an intermediate finding  with regard to the Pseudomonas he still having the bluish/green drainage as well. Overall I do not know that the wound has completely cleared of infection at this point. Fortunately there is no signs of active infection systemically at this point which is good news. 09/20/2019 upon evaluation today patient's wound actually appears to be doing about the same based on what I am seeing currently. I do not see any  signs of systemic infection he still does have evidence of some local infection and drainage. He did see Dr. Megan Salon last week and Dr. Megan Salon states that he probably does need a different IV antibiotic although he does not want to put him on this until the patient begins the Remicade infusion which is actually scheduled for about 10 days out from today on 13 May. Following that time Dr. Megan Salon is good to see him back and then will evaluate the feasibility of starting him on the IV antibiotic therapy once again at that point. I do not disagree with this plan I do believe as Dr. Megan Salon stated in his note that I reviewed today that the patient's issue is multifactorial with the pyoderma being 1 aspect of this that were hoping the Remicade will be helpful for her. In the meantime I think the gentamicin is, helping to keep things under decent okay control in regard to the ulcer. 09/27/2019 upon evaluation today patient appears to be doing about the same with regard to his wound still there is a lot of muscle exposure though he does have some hyper granulation tissue noted around the edge and actually some granulation tissue starting to form over the muscle which is actually good news. Fortunately there is no evidence of active infection which is also good news. His pain is less at this point. 5/21; this is a patient I have not seen in a long time. 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 He has a large wound on the left lateral leg with protruding muscle. He comes in the clinic today showing the same area on his left medial ankle. He says there is been a spot there for some time although we have not previously defined this. Today he has a clearly defined area with slight amount of skin breakdown surrounded by raised areas with a purplish hue in color. This is not painful he says it is irritated. This looks distinctly like I might imagine pyoderma  starting 10/25/2019 upon evaluation today patient's wound actually appears to be making some progress. He still has muscle protruding from the lateral portion of his left leg but fortunately the new area that they were concerned about at his last visit does not appear to have opened at this point. He is currently on Remicade infusions and seems to be doing better in my opinion in fact the wound itself seems to be overall much better. The purplish discoloration that he did have seems to have resolved and I think that is a good sign that hopefully the Remicade is doing its job. He does [name], [name] ([id number]) have some biofilm noted over the surface of the wound. 11/01/2019 on evaluation today patient's wound actually appears to be doing excellent at this time. Fortunately there is no evidence of active infection and overall I feel like he is making great progress. The Remicade seems to be due excellent job in my opinion. 11/08/19 evaluation today vision actually appears to be doing quite well with regard to his weight ulcer. He's been tolerating  dressing changes without complication. Fortunately there is no evidence of infection. No fevers, chills, nausea, or vomiting noted at this time. Overall states that is having more itching than pain which is actually a good sign in my opinion. 12/13/2019 upon evaluation today patient appears to be doing well today with regard to his wound. He has been tolerating the dressing changes without complication. Fortunately there is no sign of active infection at this time. No fevers, chills, nausea, vomiting, or diarrhea. Overall I feel like the infusion therapy has been very beneficial for him. 01/06/2020 on evaluation today patient appears to be doing well with regard to his wound. This is measuring smaller and actually looks to be doing better. Fortunately there is no signs of active infection at this point. No fevers, chills, nausea, vomiting, or diarrhea. With that  being said he does still have the blue-green drainage but this does not seem to be causing any significant issues currently. He has been using the gentamicin that does seem to be keeping things under decent control at this point. He goes later this morning for his next infusion therapy for the pyoderma which seems to also be very beneficial. 02/07/2020 on evaluation today patient appears to be doing about the same in regard to his wounds currently. Fortunately there is no signs of active infection systemically he does still have evidence of local infection still using gentamicin. He also is showing some signs of improvement albeit slowly I do feel like we are making some progress here. 02/21/2020 upon evaluation today patient appears to be making some signs of improvement the wound is measuring a little bit smaller which is great news and overall I am very pleased with where he stands currently. He is going to be having infusion therapy treatment on the 15th of this month. Fortunately there is no signs of active infection at this time. 03/13/2020 I do believe patient's wound is actually showing some signs of improvement here which is great news. He has continue with the infusion therapy through rheumatology/dermatology at [facility]. That does seem to be beneficial. I still think he gets as much benefit from this as he did from the prednisone initially but nonetheless obviously this is less harsh on his body that the prednisone as far as they are concerned. 03/31/2020 on evaluation today patient's wound actually showing signs of some pretty good improvement in regard to the overall appearance of the wound bed. There is still muscle exposed though he does have some epithelial growth around the edges of the wound. Fortunately there is no signs of active infection at this time. No fevers, chills, nausea, vomiting, or diarrhea. 04/24/2020 upon evaluation today patient appears to be doing about the same in regard  to his leg ulcer. He has been tolerating the dressing changes without complication. Fortunately there is no signs of active infection at this time. No fevers, chills, nausea, vomiting, or diarrhea. With that being said he still has a lot of irritation from the bandaging around the edges of the wound. We did discuss today the possibility of a referral to plastic surgery. 05/22/2020 on evaluation today patient appears to be doing well with regard to his wounds all things considered. He has not been able to get the Chantix apparently there is a recall nurse that I was unaware of put out by [employer] involuntarily. Nonetheless for now I am and I have to do some research into what may be the best option for him to help with quitting in  regard to smoking and we discussed that today. 06/26/2020 upon evaluation today patient appears to be doing well with regard to his wound from the standpoint of infection I do not see any signs of infection at this point. With that being said unfortunately he is still continuing to have issues with muscle exposure and again he is not having a whole lot of new skin growth unfortunately. There does not appear to be any signs of active infection at this time. No fevers, chills, nausea, vomiting, or diarrhea. 07/10/2020 upon evaluation today patient appears to be doing a little bit more poorly currently compared to where he was previous. I am concerned currently about an active infection that may be getting worse especially in light of the increased size and tenderness of the wound bed. No fevers, chills, nausea, vomiting, or diarrhea. 07/24/2020 upon evaluation today patient appears to be doing poorly in regard to his leg ulcer. He has been tolerating the dressing changes without complication but unfortunately is having a lot of discomfort. 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 Subsequently I think he is going  require possibly IV antibiotics to get this under control. I am very concerned about the severity of his infection and the amount of discomfort he is having. 07/31/2020 upon evaluation today patient appears to be doing about the same in regard to his leg wound. He did see Dr. Megan Salon and Dr. Megan Salon is actually going to start him on IV antibiotics. He goes for the PICC line tomorrow. With that being said there do not have that run for 2 weeks and then see how things are doing and depending on how he is progressing they may extend that a little longer. Nonetheless I am glad this is getting ready to be in place and definitely feel it may help the patient. In the meantime is been using mainly triamcinolone to the wound bed has an anti-inflammatory. 08/07/2020 on evaluation today patient appears to be doing well with regard to his wound compared even last week. In the interim he has gotten the PICC line placed and overall this seems to be doing excellent. There does not appear to be any evidence of infection which is great news systemically although locally of course has had the infection this appears to be improving with the use of the antibiotics. 08/14/2020 upon evaluation today patient's wound actually showing signs of excellent improvement. Overall the irritation has significantly improved the drainage is back down to more of a normal level and his pain is really pretty much nonexistent compared to what it was. Obviously I think that this is significantly improved secondary to the IV antibiotic therapy which has made all the difference in the world. Again he had a resistant form of Pseudomonas for which oral antibiotics just was not cutting it. Nonetheless I do think that still we need to consider the possibility of a surgical closure for this wound is been open so long and to be honest with muscle exposed I think this can be very hard to get this to close outside of this although definitely were  still working to try to do what we can in that regard. 08/21/2020 upon evaluation today patient appears to be doing very well with regard to his wounds on the left lateral lower extremity/calf area. Fortunately there does not appear to be signs of active infection which is great news and overall very pleased with where things stand today. He is  actually wrapping up his treatment with IV antibiotics tomorrow. After that we will see where things go from there. 08/28/2020 upon evaluation today patient appears to be doing decently well with regard to his leg ulcer. There does not appear to be any signs of [name], [name] ([id number]) active infection which is great news and overall very pleased with where things stand today. No fevers, chills, nausea, vomiting, or diarrhea. 09/18/2020 upon evaluation today patient appears to be doing well with regard to his infection which I feel like is better. Unfortunately he is not doing as well with regard to the overall size of the wound which is not nearly as good at this point. I feel like that he may be having an issue here with the pyoderma being somewhat out of control. I think that he may benefit from potentially going back and talking to the dermatologist about what to do from the pyoderma standpoint. I am not certain if the infusions are helping nearly as much is what the prednisone did in the past. 10/02/2020 upon evaluation today patient appears to be doing well with regard to his leg ulcer. He did go to the Psychiatric nurse. Unfortunately they feel like there is a 10% chance that most that he would be able to heal and that the skin graft would take. Obviously this has led him to not be able to go down that path as far as treatment is concerned. Nonetheless he does seem to be doing a little bit better with the prednisone that I gave him last time. I think that he may need to discuss with dermatology the possibility of long-term prednisone as that seems to be what is  most helpful for him to be perfectly honest. I am not sure the Remicade is really doing the job. 10/17/2020 upon evaluation today patient appears to be doing a little better in regard to his wound. In fact the case has been since we did the prednisone on May 2 for him that we have noticed a little bit of improvement each time we have seen a size wise as well as appearance wise as well as pain wise. I think the prednisone has had a greater effect then the infusion therapy has to be perfectly honest. With that being said the patient also feels significantly better compared to what he was previous. All of this is good news but nonetheless I am still concerned about the fact that again we are really not set up to long-term manage him as far as prednisone is concerned. Obviously there are things that you need to be watched I completely understand the risk of prednisone usage as well. 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 With all that being said I do believe that we can give him another round of the prednisone which she is requesting today because of the improvement that he seen since we did that first round. 10/30/2020 upon evaluation today patient's wound actually is showing signs of doing quite well. There does not appear to be any evidence of infection which is great news and overall very pleased with where things stand today. No fevers, chills, nausea, vomiting, or diarrhea. He tells me that the prednisone still has seem to have helped he wonders if we can extend that for just a little bit longer. He did not have the appointment with a dermatologist although he did have an infusion appointment last Friday. That was at [facility]. With  that being said he tells me he could not do both that as well as the appointment with the physician on the same day therefore that is can have to be rescheduled. I really want to see if there is anything they feel like that could  be done differently to try to help this out as I am not really certain that the infusions are helping significantly here. 11/13/2020 upon evaluation today patient unfortunately appears to be doing somewhat poorly in regard to his wound I feel like this is actually worsening from the standpoint of the pyoderma spreading. I still feel like that he may need something different as far as trying to manage this going forward. Again we did the prednisone unfortunately his blood sugars are not doing so well because of this. Nonetheless I believe that the patient likely needs to try topical steroid. We have done triamcinolone for a while I think going with something stronger such as clobetasol could be beneficial again this is not something I do lightly I discussed this with the patient that again this does not normally put underneath an occlusive dressing. Nonetheless I think a thin film as such could help with some of the stronger anti-inflammatory effects. We discussed this today. He would like to try to give this a trial for the next couple weeks. I definitely think that is something that we can do. Evaluate7/03/2021 and today patient's wound bed actually showed signs of doing really about the same. There was a little expansion of the size of the wound and that leading edge that we done looking out although the clobetasol does seem to have slowed this down a bit in my opinion. There is just 1 small area that still seems to be progressing based on what I see. Nonetheless I am concerned about the fact this does not seem to be improving if anything seems to be doing a little bit worse. I do not know that the infusions are really helping him much as next infusion is August 5 his appointment with dermatology is July 25. Either way I really think that we need to have a conversation potentially about this and I am actually going to see if I can talk with Dr. Lillia Carmel in order to see where things stand as  well. 12/11/2020 upon evaluation today patient appears to be doing worse in regard to his leg ulcer. Unfortunately I just do not think this is making the progress that I would like to see at this point. Honestly he does have an appointment with dermatology and this is in 2 days. I am wondering what they may have to offer to help with this. Right now what I am seeing is that he is continuing to show signs of worsening little by little. Obviously that is not great at all. Is the exact opposite of what we are looking for. 12/18/2020 upon evaluation today patient appears to be doing a little better in regard to his wound. The dermatologist actually did do some steroid injections into the wound which does seem to have been beneficial in my opinion. That was on the 25th already this looks a little better to me than last time I saw him. With that being said we did do a culture and this did show that he has Staph aureus noted in abundance in the wound. With that being said I do think that getting him on an oral antibiotic would be appropriate as well. Also think we can compression wrap and this  will make a difference as well. 12/28/2020 upon evaluation today patient's wound is actually showing signs of doing much better. I do believe the compression wrap is helping he has a lot of drainage but to be honest I think that the compression is helping to some degree in this regard as well as not draining through which is also good news. No fevers, chills, nausea, vomiting, or diarrhea. 01/04/2021 upon evaluation today patient appears to be doing well with regard to his wound. Overall things seem to be doing quite well. He did have a little bit of reaction to the CarboFlex Sorbact he will be using that any longer. With that being said he is controlled as far as the drainage is concerned overall and seems to be doing quite well. I do not see any signs of active infection at this time which is great news. No fevers,  chills, nausea, vomiting, or diarrhea. Objective Constitutional Well-nourished and well-hydrated in no acute distress. GERASIMOS, PLOTTS ([id number]) Vitals Time Taken: 8:23 AM, Height: 71 in, Weight: 338 lbs, BMI: 47.1, Temperature: 98.7 F, Pulse: 93 bpm, Respiratory Rate: 18 breaths/min, Blood Pressure: 137/82 mmHg. Respiratory normal breathing without difficulty. Psychiatric this patient is able to make decisions and demonstrates good insight into disease process. Alert and Oriented x 3. pleasant and cooperative. General Notes: Upon inspection patient's wound bed showed signs of good granulation epithelization at this point. Fortunately there does not appear to be any evidence of infection which is great news and overall I am extremely pleased with where things stand at this point. No fevers, chills, nausea, vomiting, or diarrhea. Integumentary (Hair, Skin) Wound #1 status is Open. Original cause of wound was Gradually Appeared. The date acquired was: 11/18/2015. The wound has been in treatment 213 weeks. The wound is located on the Left,Lateral Lower Leg. The wound measures 6.7cm length x 6.2cm width x 1.3cm depth; 32.625cm^2 area and 42.413cm^3 volume. There is muscle and Fat Layer (Subcutaneous Tissue) exposed. There is no tunneling noted, however, there is undermining starting at 6:00 and ending at 9:00 with a maximum distance of 1.3cm. There is a large amount of serosanguineous drainage noted. There is large (67-100%) red granulation within the wound bed. There is a small (1-33%) amount of necrotic tissue within the wound bed including Adherent Slough. 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 There was a Four Layer Compression Therapy Procedure with a pre-treatment ABI of 1.2 by Dolan Amen, RN. Post procedure Diagnosis Wound #1: Same as Pre-Procedure Plan Follow-up Appointments: Return Appointment in 1 week. - Next Thursday Nurse Visit as needed - Monday Bathing/ Shower/ Hygiene: Clean wound with Normal Saline or wound cleanser. Edema Control - Lymphedema / Segmental Compressive Device / Other: Optional: One layer of unna paste to top of compression wrap (to act as an anchor). 4 Layer Compression System Lymphedema. Other: - Call [facility] for knee high compression socks 20/19m/Hg Medications-Please add to medication list.: P.O. Antibiotics The following medication(s) was prescribed: Bactrim DS oral 800 mg-160 mg tablet 1 1 tablet oral taken 2 times per day for 14 days starting 01/04/2021 WOUND #1: - Lower Leg Wound Laterality: Left, Lateral Cleanser: Soap and Water 2 x  Per Week/30 Days Discharge Instructions: Gently cleanse wound with antibacterial soap, rinse and pat dry prior to dressing wounds Topical: Triamcinolone Acetonide Cream, 0.1%, 15 (g) tube 2 x Per Week/30 Days TENNIS, MCKINNON ([id number]) Discharge Instructions: Apply in office only to 7 o'clock area Primary Dressing: Aquacel Extra Hydrofiber Dressing, 4x5 (in/in) 2 x Per Week/30 Days Discharge Instructions: Apply on wound bed Secondary Dressing: Gauze 2 x Per Week/30 Days Discharge Instructions: Cover alginate with dry gauze Secondary Dressing: Xtrasorb Medium 4x5 (in/in) 2 x Per Week/30 Days Discharge Instructions: Apply to wound as directed. Do not cut. Compression Wrap: Medichoice 4 layer Compression System, 35-40 mmHG (Generic) 2 x Per Week/30 Days Discharge Instructions: Apply multi-layer wrap as directed. 1. Would recommend currently that we going continue with the wound  care measures as before using the plain alginate dressing which I think is doing a great job. 2. We will continue with zinc around the edges of the wound. 3. I am also going to recommend that the patient continue to proceed with the 4-layer compression wrap which I think is also doing a great job. We will see patient back for reevaluation in 1 week here in the clinic. If anything worsens or changes patient will contact our office for additional recommendations. Electronic Signature(s) Signed: 01/04/2021 4:25:38 PM By: Worthy Keeler PA-C Previous Signature: 01/04/2021 9:00:16 AM Version By: Worthy Keeler PA-C Entered By: Worthy Keeler on 01/04/2021 16:25:38 KRISTOF, NADEEM ([id number]) -------------------------------------------------------------------------------- SuperBill Details Patient Name: [name] Date of Service: 01/04/2021 Medical Record Number: [id number] Patient Account Number: [id number] Date of Birth/Sex: [birth year] (40 y.o. 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 I87.2 Venous insufficiency (chronic) (peripheral) L03.116 Cellulitis of left lower limb F17.208 Nicotine dependence, unspecified, with other nicotine-induced disorders Facility Procedures CPT4 Code: [id number] Description: (Facility Use Only) [phone] - APPLY MULTLAY COMPRS LWR LT LEG Modifier: Quantity: 1 Physician Procedures CPT4 Code: 2902111 Description: 55208 - WC PHYS LEVEL 4 - EST PT Modifier: Quantity: 1 CPT4 Code: Description: ICD-10 Diagnosis Description L97.222 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

## 2021-01-08 ENCOUNTER — Other Ambulatory Visit: Payer: Self-pay

## 2021-01-08 DIAGNOSIS — L97222 Non-pressure chronic ulcer of left calf with fat layer exposed: Secondary | ICD-10-CM | POA: Diagnosis not present

## 2021-01-10 NOTE — Progress Notes (Signed)
RHONE, OZAKI ([id number]) Visit Report for 01/08/2021 Arrival Information Details Patient Name: [name], CABINESS Date of Service: 01/08/2021 2:15 PM Medical Record Number: [id number] Patient Account Number: [id number] Date of Birth/Sex: 1981-03-09 (40 y.o. M) Treating RN: [name] Primary Care Azai Gaffin: [name] Other Clinician: Referring Roberta Angell: [name] Treating Meklit Cotta/Extender: [name] Cliche in Treatment: 213 Visit Information History Since Last Visit Has Compression in Place as Prescribed: Yes Patient Arrived: Ambulatory Pain Present Now: No Arrival Time: 14:22 Accompanied By: self Transfer Assistance: None Patient Identification Verified: Yes Secondary Verification Process Completed: Yes Patient Requires Transmission-Based No Precautions: Patient Has Alerts: Yes Patient Alerts: Patient has reaction to silver dressings. Electronic Signature(s) Signed: 01/10/2021 10:03:11 AM By: [name], [name], RN, [name], [name], [name] Entered By: [name], [name], RN, [name], [name] on 01/08/2021 14:22:30 JERIMIAH, WOLMAN ([id number]) -------------------------------------------------------------------------------- Compression Therapy Details Patient Name: [name] Date of Service: 01/08/2021 2:15 PM Medical Record Number: [id number] Patient Account Number: [id number] Date of Birth/Sex: [birth year] (39 y.o. M) Treating RN: [name] Primary Care Karianne Nogueira: [name] Other Clinician: Referring Marianela Mandrell: [name] Treating Maika Kaczmarek/Extender: [name] Cliche in Treatment: 213 Compression Therapy Performed for Wound Assessment: Wound #1 Left,Lateral Lower Leg Performed By: Clinician [name], RN Compression Type: Four Layer Pre Treatment ABI: 1.2 Electronic Signature(s) Signed: 01/10/2021 10:03:11 AM By: [name], [name], RN, [name], [name], [name] Entered By: [name], [name], RN, [name], [name] on 01/08/2021 14:47:08 [name]  ([id number]) -------------------------------------------------------------------------------- Encounter Discharge Information Details Patient Name: [name] Date of Service: 01/08/2021 2:15 PM Medical Record Number: [id number] Patient Account Number: [id number] Date of Birth/Sex: Jul 05, 1980 (39 y.o. M) Treating RN: [name] Primary Care Jadah Bobak: [name] Other Clinician: Referring Chayla Shands: [name] Treating Adine Heimann/Extender: [name] Cliche in Treatment: 213 Encounter Discharge Information Items Discharge Condition: Stable Ambulatory Status: Ambulatory Discharge Destination: Home Transportation: Private Auto Accompanied By: self Schedule Follow-up Appointment: No Clinical Summary of Care: Electronic Signature(s) Signed: 01/10/2021 10:03:11 AM By: [name], [name], RN, [name], [name], [name] Entered By: [name], [name], RN, [name], [name] on 01/08/2021 14:53:14 [name] ([id number]) -------------------------------------------------------------------------------- Wound Assessment Details Patient Name: [name] Date of Service: 01/08/2021 2:15 PM Medical Record Number: [id number] Patient Account Number: [id number] Date of Birth/Sex: December 12, 1980 (39 y.o. M) Treating RN: [name] Primary Care Lastacia Solum: [name] Other Clinician: Referring Damareon Lanni: [name] Treating Rose-Marie Hickling/Extender: [name] Cliche in Treatment: 213 Wound Status Wound Number: 1 Primary Etiology: Pyoderma Wound Location: Left, Lateral Lower Leg Wound Status: Open Wounding Event: Gradually Appeared Comorbid History: Sleep Apnea, Hypertension, Colitis Date Acquired: 11/18/2015 Weeks Of Treatment: 213 Clustered Wound: No Wound Measurements Length: (cm) 6.5 Width: (cm) 6.2 Depth: (cm) 0.3 Area: (cm) 31.652 Volume: (cm) 9.495 % Reduction in Area: -544.8% % Reduction in Volume: -141.8% Epithelialization: None Tunneling: No Undermining: No Wound Description Classification: Full  Thickness With Exposed Support Structures Exudate Amount: Large Exudate Type: Purulent Exudate Color: yellow, brown, green Foul Odor After Cleansing: No Slough/Fibrino Yes Wound Bed Granulation Amount: Large (67-100%) Exposed Structure Granulation Quality: Red Fascia Exposed: No Necrotic Amount: Small (1-33%) Fat Layer (Subcutaneous Tissue) Exposed: Yes Necrotic Quality: Adherent Slough Tendon Exposed: No Muscle Exposed: Yes Necrosis of Muscle: No Joint Exposed: No Bone Exposed: No Treatment Notes Wound #1 (Lower Leg) Wound Laterality: Left, Lateral Cleanser Soap and Water Discharge Instruction: Gently cleanse wound with antibacterial soap, rinse and pat dry prior to dressing wounds Peri-Wound Care Desitin Maximum Strength Ointment 4 (oz) Topical Primary Dressing zetuvit Secondary Dressing Secured With Compression Wrap Medichoice 4 layer Compression System, 35-40  mmHG Discharge Instruction: Apply multi-layer wrap as directed. BERLE, FITZ ([id number]) Compression Stockings Add-Ons CarboFLEX Odor Control Dressing, 4x4 (in/in) Discharge Instruction: Place the fibrous (non-shiny) surface of the dressing directly onto the wound for management of malodorous wounds. Electronic Signature(s) Signed: 01/10/2021 10:03:11 AM By: [name], [name], RN, [name], [name], [name] Entered By: [name], [name], RN, [name], [name] on 01/08/2021 14:46:17

## 2021-01-10 NOTE — Progress Notes (Signed)
DIVON, KRABILL ([id number]) Visit Report for 01/08/2021 Physician Orders Details Patient Name: [name], [name] Date of Service: 01/08/2021 2:15 PM Medical Record Number: [id number] Patient Account Number: [id number] Date of Birth/Sex: 1981/04/08 (40 y.o. M) Treating RN: [name] Primary Care Provider: [name] Other Clinician: Referring Provider: [name] Treating Provider/Extender: [name] Cliche in Treatment: [phone] Verbal / Phone Orders: No Diagnosis Coding Follow-up Appointments o Return Appointment in 1 week. - Next Thursday o Nurse Visit as needed - Monday Bathing/ Shower/ Hygiene o Clean wound with Normal Saline or wound cleanser. Edema Control - Lymphedema / Segmental Compressive Device / Other o Optional: One layer of unna paste to top of compression wrap (to act as an anchor). o 4 Layer Compression System Lymphedema. o Other: - Call [facility] for knee high compression socks 20/64m/Hg Medications-Please add to medication list. o P.O. Antibiotics Wound Treatment Wound #1 - Lower Leg Wound Laterality: Left, Lateral Cleanser: Soap and Water 2 x Per Week/30 Days Discharge Instructions: Gently cleanse wound with antibacterial soap, rinse and pat dry prior to dressing wounds Peri-Wound Care: Desitin Maximum Strength Ointment 4 (oz) 2 x Per Week/30 Days Primary Dressing: zetuvit 2 x Per Week/30 Days Primary Dressing: 2 x Per Week/30 Days Compression Wrap: Medichoice 4 layer Compression System, 35-40 mmHG (Generic) 2 x Per Week/30 Days Discharge Instructions: Apply multi-layer wrap as directed. Add-Ons: CarboFLEX Odor Control Dressing, 4x4 (in/in) 2 x Per Week/30 Days Discharge Instructions: Place the fibrous (non-shiny) surface of the dressing directly onto the wound for management of malodorous wounds. Electronic Signature(s) Signed: 01/08/2021 5:15:29 PM By: SWorthy KeelerPA-C Signed: 01/10/2021 10:03:11 AM By: WGretta Cool [name], RN, [name], [name],  [name] Entered By: WGretta Cool [name], RN, [name], [name] on 01/08/2021 14:52:08 LJAIRO, BELLEW([id number] -------------------------------------------------------------------------------- SuperBill Details Patient Name: [name] KernsDate of Service: 01/08/2021 Medical Record Number: [id number] Account Number: [id number] of Birth/Sex: 921-Oct-1982(39 y.o. 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

## 2021-01-11 ENCOUNTER — Other Ambulatory Visit: Payer: Self-pay

## 2021-01-11 ENCOUNTER — Encounter: Payer: 59 | Admitting: Physician Assistant

## 2021-01-11 DIAGNOSIS — L97222 Non-pressure chronic ulcer of left calf with fat layer exposed: Secondary | ICD-10-CM | POA: Diagnosis not present

## 2021-01-11 NOTE — Progress Notes (Signed)
TRUSTEN, HUME ([id number]) Visit Report for 01/11/2021 Arrival Information Details Patient Name: [name], [name] Date of Service: 01/11/2021 8:15 AM Medical Record Number: [id number] Patient Account Number: [id number] Date of Birth/Sex: [birth year] (40 y.o. M) Treating RN: Dolan Amen Primary Care Hays Dunnigan: [name] Other Clinician: Referring Anali Cabanilla: [name] Treating Maycel Riffe/Extender: [name] Cliche in Treatment: 214 Visit Information History Since Last Visit Pain Present Now: No Patient Arrived: Ambulatory Arrival Time: 08:15 Accompanied By: self Transfer Assistance: None Patient Identification Verified: Yes Secondary Verification Process Completed: Yes Patient Requires Transmission-Based No Precautions: Patient Has Alerts: Yes Patient Alerts: Patient has reaction to silver dressings. Electronic Signature(s) Signed: 01/11/2021 3:34:58 PM By: Dolan Amen RN Entered By: Dolan Amen on 01/11/2021 08:16:11 ARSAL, TAPPAN ([id number]) -------------------------------------------------------------------------------- Clinic Level of Care Assessment Details Patient Name: [name], [name] Date of Service: 01/11/2021 8:15 AM Medical Record Number: [id number] Patient Account Number: [id number] Date of Birth/Sex: [birth year] (40 y.o. M) Treating RN: Dolan Amen Primary Care Ether Goebel: [name] Other Clinician: Referring Rosann Gorum: [name] Treating Broderic Bara/Extender: [name] Cliche in Treatment: 214 Clinic Level of Care Assessment Items TOOL 1 Quantity Score []  - Use when EandM and Procedure is performed on INITIAL visit 0 ASSESSMENTS - Nursing Assessment / Reassessment []  - General Physical Exam (combine w/ comprehensive assessment (listed just below) when performed on new 0 pt. evals) []  - 0 Comprehensive Assessment (HX, ROS, Risk Assessments, Wounds Hx, etc.) ASSESSMENTS - Wound and Skin Assessment / Reassessment []  - Dermatologic / Skin  Assessment (not related to wound area) 0 ASSESSMENTS - Ostomy and/or Continence Assessment and Care []  - Incontinence Assessment and Management 0 []  - 0 Ostomy Care Assessment and Management (repouching, etc.) PROCESS - Coordination of Care []  - Simple Patient / Family Education for ongoing care 0 []  - 0 Complex (extensive) Patient / Family Education for ongoing care []  - 0 Staff obtains Programmer, systems, Records, Test Results / Process Orders []  - 0 Staff telephones HHA, Nursing Homes / Clarify orders / etc []  - 0 Routine Transfer to another Facility (non-emergent condition) []  - 0 Routine Hospital Admission (non-emergent condition) []  - 0 New Admissions / Biomedical engineer / Ordering NPWT, Apligraf, etc. []  - 0 Emergency Hospital Admission (emergent condition) PROCESS - Special Needs []  - Pediatric / Minor Patient Management 0 []  - 0 Isolation Patient Management []  - 0 Hearing / Language / Visual special needs []  - 0 Assessment of Community assistance (transportation, D/C planning, etc.) []  - 0 Additional assistance / Altered mentation []  - 0 Support Surface(s) Assessment (bed, cushion, seat, etc.) INTERVENTIONS - Miscellaneous []  - External ear exam 0 []  - 0 Patient Transfer (multiple staff / Civil Service fast streamer / Similar devices) []  - 0 Simple Staple / Suture removal (25 or less) []  - 0 Complex Staple / Suture removal (26 or more) []  - 0 Hypo/Hyperglycemic Management (do not check if billed separately) []  - 0 Ankle / Brachial Index (ABI) - do not check if billed separately Has the patient been seen at the hospital within the last three years: Yes Total Score: 0 Level Of Care: ____ [name] ([id number]) Electronic Signature(s) Signed: 01/11/2021 3:34:58 PM By: Dolan Amen RN Entered By: Dolan Amen on 01/11/2021 08:59:36 Silos, Marquize ([id number]) -------------------------------------------------------------------------------- Compression Therapy  Details Patient Name: [name] Date of Service: 01/11/2021 8:15 AM Medical Record Number: [id number] Patient Account Number: [id number] Date of Birth/Sex: [birth year] (40 y.o. M) Treating RN: Dolan Amen Primary Care Elanor Cale: [name] Other Clinician: Referring Elga Santy: [name] Treating  Janeva Peaster/Extender: Jeri Cos Weeks in Treatment: 214 Compression Therapy Performed for Wound Assessment: Wound #1 Left,Lateral Lower Leg Performed By: Clinician Dolan Amen, RN Compression Type: Four Layer Pre Treatment ABI: 1.2 Post Procedure Diagnosis Same as Pre-procedure Electronic Signature(s) Signed: 01/11/2021 3:34:58 PM By: Dolan Amen RN Entered By: Dolan Amen on 01/11/2021 08:56:03 [name] ([id number]) -------------------------------------------------------------------------------- Encounter Discharge Information Details Patient Name: [name] Date of Service: 01/11/2021 8:15 AM Medical Record Number: [id number] Patient Account Number: [id number] Date of Birth/Sex: [birth year] (39 y.o. M) Treating RN: Dolan Amen Primary Care Betta Balla: [name] Other Clinician: Referring Maxwel Meadowcroft: [name] Treating Anniya Whiters/Extender: [name] Cliche in Treatment: 214 Encounter Discharge Information Items Discharge Condition: Stable Ambulatory Status: Ambulatory Discharge Destination: Home Transportation: Private Auto Accompanied By: SELF Schedule Follow-up Appointment: Yes Clinical Summary of Care: Electronic Signature(s) Signed: 01/11/2021 3:34:58 PM By: Dolan Amen RN Entered By: Dolan Amen on 01/11/2021 09:00:44 [name] ([id number]) -------------------------------------------------------------------------------- Lower Extremity Assessment Details Patient Name: [name] Date of Service: 01/11/2021 8:15 AM Medical Record Number: [id number] Patient Account Number: [id number] Date of Birth/Sex: Oct 02, 1980 (40 y.o.  M) Treating RN: Dolan Amen Primary Care [name] Rogness: [name] Other Clinician: Referring Inetha Maret: [name] Treating Deolinda Frid/Extender: [name] Cliche in Treatment: 214 Edema Assessment Assessed: [Left: Yes] [Right: No] Edema: [Left: Ye] [Right: s] Calf Left: Right: Point of Measurement: 33 cm From Medial Instep 47.5 cm Ankle Left: Right: Point of Measurement: 12 cm From Medial Instep 28.5 cm Vascular Assessment Pulses: Dorsalis Pedis Palpable: [Left:Yes] Electronic Signature(s) Signed: 01/11/2021 3:34:58 PM By: Dolan Amen RN Entered By: Dolan Amen on 01/11/2021 08:32:10 Fredrick, [name] ([id number]) -------------------------------------------------------------------------------- Multi Wound Chart Details Patient Name: [name] Date of Service: 01/11/2021 8:15 AM Medical Record Number: [id number] Patient Account Number: [id number] Date of Birth/Sex: July 04, 1980 (39 y.o. M) Treating RN: Dolan Amen Primary Care Brieonna Crutcher: [name] Other Clinician: Referring Jordynn Perrier: [name] Treating Tylyn Stankovich/Extender: [name] Cliche in Treatment: 214 Vital Signs Height(in): 71 Pulse(bpm): 120 Weight(lbs): 338 Blood Pressure(mmHg): 117/77 Body Mass Index(BMI): 47 Temperature(F): 98.8 Respiratory Rate(breaths/min): 18 Photos: [N/A:N/A] Wound Location: Left, Lateral Lower Leg N/A N/A Wounding Event: Gradually Appeared N/A N/A Primary Etiology: Pyoderma N/A N/A Comorbid History: Sleep Apnea, Hypertension, Colitis N/A N/A Date Acquired: 11/18/2015 N/A N/A Weeks of Treatment: 214 N/A N/A Wound Status: Open N/A N/A Measurements L x W x D (cm) 6.4x6.5x1 N/A N/A Area (cm) : 32.673 N/A N/A Volume (cm) : 32.673 N/A N/A % Reduction in Area: -565.60% N/A N/A % Reduction in Volume: -732.00% N/A N/A Starting Position 1 (o'clock): 6 Ending Position 1 (o'clock): 9 Maximum Distance 1 (cm): 1 Undermining: Yes N/A N/A Classification: Full  Thickness With Exposed N/A N/A Support Structures Exudate Amount: Large N/A N/A Exudate Type: Purulent N/A N/A Exudate Color: yellow, brown, green N/A N/A Granulation Amount: Large (67-100%) N/A N/A Granulation Quality: Red N/A N/A Necrotic Amount: Small (1-33%) N/A N/A Exposed Structures: Fat Layer (Subcutaneous Tissue): N/A N/A Yes Muscle: Yes Fascia: No Tendon: No Joint: No Bone: No Epithelialization: None N/A N/A Treatment Notes Electronic Signature(s) Signed: 01/11/2021 3:34:58 PM By: Dolan Amen RN Entered By: Dolan Amen on 01/11/2021 08:55:43 Bath, Doyle ([id number]) HATCHER, FRONING ([id number]) -------------------------------------------------------------------------------- Multi-Disciplinary Care Plan Details Patient Name: [name] Date of Service: 01/11/2021 8:15 AM Medical Record Number: [id number] Patient Account Number: [id number] Date of Birth/Sex: July 29, 1980 (40 y.o. M) Treating RN: Dolan Amen Primary Care Sherry Blackard: [name] Other Clinician: Referring Loris Winrow: [name] Treating Shemicka Cohrs/Extender: [name] Cliche  in Treatment: [place] reviewed with physician Active Inactive Electronic Signature(s) Signed: 01/11/2021 3:34:58 PM By: Dolan Amen RN Entered By: Dolan Amen on 01/11/2021 08:55:34 Sanderlin, [name] ([id number]) -------------------------------------------------------------------------------- Pain Assessment Details Patient Name: [name] Date of Service: 01/11/2021 8:15 AM Medical Record Number: [id number] Patient Account Number: [id number] Date of Birth/Sex: 01/02/1981 (40 y.o. M) Treating RN: Dolan Amen Primary Care Mikaili Flippin: [name] Other Clinician: Referring Andreka Stucki: [name] Treating Madasyn Heath/Extender: [name] Cliche in Treatment: 214 Active Problems Location of Pain Severity and Description of Pain Patient Has Paino No Site Locations Rate the  pain. Current Pain Level: 0 Pain Management and Medication Current Pain Management: Electronic Signature(s) Signed: 01/11/2021 3:34:58 PM By: Dolan Amen RN Entered By: Dolan Amen on 01/11/2021 08:17:08 [name] ([id number]) -------------------------------------------------------------------------------- Patient/Caregiver Education Details Patient Name: [name] Date of Service: 01/11/2021 8:15 AM Medical Record Number: [id number] Patient Account Number: [id number] Date of Birth/Gender: [birth year] (39 y.o. M) Treating RN: Dolan Amen Primary Care Physician: [name] Other Clinician: Referring Physician: [name] Treating Physician/Extender: [name] Cliche in Treatment: 214 Education Assessment Education Provided To: Patient Education Topics Provided Wound/Skin Impairment: Methods: Explain/Verbal Responses: State content correctly Electronic Signature(s) Signed: 01/11/2021 3:34:58 PM By: Dolan Amen RN Entered By: Dolan Amen on 01/11/2021 09:00:06 [name] ([id number]) -------------------------------------------------------------------------------- Wound Assessment Details Patient Name: [name] Date of Service: 01/11/2021 8:15 AM Medical Record Number: [id number] Patient Account Number: [id number] Date of Birth/Sex: 12/29/1980 (39 y.o. M) Treating RN: Dolan Amen Primary Care Vernella Niznik: [name] Other Clinician: Referring Caedence Snowden: [name] Treating Kross Swallows/Extender: [name] Cliche in Treatment: 214 Wound Status Wound Number: 1 Primary Etiology: Pyoderma Wound Location: Left, Lateral Lower Leg Wound Status: Open Wounding Event: Gradually Appeared Comorbid History: Sleep Apnea, Hypertension, Colitis Date Acquired: 11/18/2015 Weeks Of Treatment: 214 Clustered Wound: No Photos Wound Measurements Length: (cm) 6.4 Width: (cm) 6.5 Depth: (cm) 1 Area: (cm) 32.673 Volume: (cm) 32.673 % Reduction in  Area: -565.6% % Reduction in Volume: -732% Epithelialization: None Tunneling: No Undermining: Yes Starting Position (o'clock): 6 Ending Position (o'clock): 9 Maximum Distance: (cm) 1 Wound Description Classification: Full Thickness With Exposed Support Structures Exudate Amount: Large Exudate Type: Purulent Exudate Color: yellow, brown, green Foul Odor After Cleansing: No Slough/Fibrino Yes Wound Bed Granulation Amount: Large (67-100%) Exposed Structure Granulation Quality: Red Fascia Exposed: No Necrotic Amount: Small (1-33%) Fat Layer (Subcutaneous Tissue) Exposed: Yes Necrotic Quality: Adherent Slough Tendon Exposed: No Muscle Exposed: Yes Necrosis of Muscle: No Joint Exposed: No Bone Exposed: No Treatment Notes Wound #1 (Lower Leg) Wound Laterality: Left, Lateral Cleanser Onofrio, Ronney ([id number]) Soap and Water Discharge Instruction: Gently cleanse wound with antibacterial soap, rinse and pat dry prior to dressing wounds Peri-Wound Care Desitin Maximum Strength Ointment 4 (oz) Topical Clobetasol Propionate ointment 0.05%, 60 (g) tube Discharge Instruction: Apply in undermining area and reddened area on posterior leg Primary Dressing Calcium Alginate Dressing, 6x6 (in/in) Discharge Instruction: Apply to wound bed Secondary Dressing Xtrasorb Large 6x9 (in/in) Discharge Instruction: Apply to wound as directed. Do not cut. Zetuvit Absorbent Pad, 4x8 (in/in) Discharge Instruction: Apply on posterior leg to collect drainage Secured With Compression Wrap Medichoice 4 layer Compression System, 35-40 mmHG Discharge Instruction: Apply multi-layer wrap as directed. Compression Stockings Add-Ons CarboFLEX Odor Control Dressing, 4x4 (in/in) Discharge Instruction: PLEASE APPLY OVER THE XSORB DO NOT APPLY ON SKIN Place the fibrous (non-shiny) surface of the dressing directly onto the wound for management of malodorous wounds. Electronic Signature(s) Signed:  01/11/2021  3:34:58 PM By: Dolan Amen RN Entered By: Dolan Amen on 01/11/2021 08:29:48 JAHLANI, LORENTZ ([id number]) -------------------------------------------------------------------------------- [place] Details Patient Name: [name] Date of Service: 01/11/2021 8:15 AM Medical Record Number: [id number] Patient Account Number: [id number] Date of Birth/Sex: 08/02/1980 (39 y.o. M) Treating RN: Dolan Amen Primary Care Hassie Mandt: [name] Other Clinician: Referring Lubna Stegeman: [name] Treating Garald Rhew/Extender: [name] Cliche in Treatment: 214 Vital Signs Time Taken: 08:16 Temperature (F): 98.8 Height (in): 71 Pulse (bpm): 120 Weight (lbs): 338 Respiratory Rate (breaths/min): 18 Body Mass Index (BMI): 47.1 Blood Pressure (mmHg): 117/77 Reference Range: 80 - 120 mg / dl Electronic Signature(s) Signed: 01/11/2021 3:34:58 PM By: Dolan Amen RN Entered By: Dolan Amen on 01/11/2021 08:16:53

## 2021-01-11 NOTE — Progress Notes (Addendum)
DEONTA, BOMBERGER ([id number]) Visit Report for 01/11/2021 Chief Complaint Document Details Patient Name: [name] Date of Service: 01/11/2021 8:15 AM Medical Record Number: [id number] Patient Account Number: [id number] Date of Birth/Sex: 1980/12/10 (40 y.o. 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 M) Treating RN: Dolan Amen Primary Care Provider: [name] Other Clinician: Referring Provider: [name] Treating Provider/Extender: [name] Cliche in Treatment: 214 History of Present Illness HPI Description: 12/04/16; 40 year old man who comes into the clinic today for review of a wound on the posterior left calf. He tells me that is been there for about a year. He is not a diabetic he does smoke half a pack per day. He was seen in the ER on 11/20/16 felt to have cellulitis around the wound and was given clindamycin. An x-ray did not show osteomyelitis. The patient initially tells me that he has a milk allergy that sets off a pruritic itching rash on his lower legs which she scratches incessantly and he thinks that's what may have set up the wound. He has been using various topical antibiotics and ointments without any effect. He works in a [employer] and is on his feet all day. He does not have a  prior history of wounds however he does have the rash on both lower legs the right arm and the ventral aspect of his left arm. These are excoriations and clearly have had scratching however there are of macular looking areas on both legs including a substantial larger area on the right leg. This does not have an underlying open area. There is no blistering. The patient tells me that 2 years ago in [place] in response to the rash on his legs he saw a dermatologist who told him he had a condition which may be pyoderma gangrenosum although I may be putting words into his mouth. He seemed to recognize this. On further questioning he admits to a 5 year history of quiesced. ulcerative colitis. He is not in any treatment for this. He's had no recent travel 12/11/16; the patient arrives today with his wound and roughly the same condition we've been using silver alginate this is a deep punched out wound with some surrounding erythema but no tenderness. Biopsy I did did not show confirmed pyoderma gangrenosum suggested nonspecific inflammation and vasculitis but does not provide an actual description of what was seen by the pathologist. I'm really not able to understand this We have also received information from the patient's dermatologist in [place] notes from April 2016. This was a doctor Agarwal-antal. 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 He was prescribed topical steroid high potency under occlusion which helped but at this point the patient did not have a deep punched out wound. 12/18/16; the patient's wound is larger in terms of surface area however this surface looks better and there is less depth. The surrounding erythema also is better. The patient states that the wrap we put on came off 2 days ago when he has been using his compression stockings.  He we are in the process of getting a dermatology consult. 12/26/16 on evaluation today patient's left lower extremity wound shows evidence of  infection with surrounding erythema noted. He has been tolerating the dressing changes but states that he has noted more discomfort. There is a larger area of erythema surrounding the wound. No fevers, chills, nausea, or vomiting noted at this time. With that being said the wound still does have slough covering the surface. He is not allergic to any medication that he is aware of at this point. In regard to his right lower extremity he had several regions that are erythematous and pruritic he wonders if there's anything we can do to help that. 01/02/17 I reviewed patient's wound culture which was obtained his visit last week. He was placed on doxycycline at that point. Unfortunately that does not appear to be an antibiotic that would likely help with the situation however the pseudomonas noted on culture is sensitive to Cipro. Also unfortunately patient's wound seems to have a large compared to last week's evaluation. Not severely so but there are definitely increased measurements in general. He is continuing to have discomfort as well he writes this to be a seven out of 10. In fact he would prefer me not to perform any debridement today due to the fact that he is having discomfort and considering he has an active infection on the little reluctant to do so anyway. No fevers, chills, nausea, or vomiting noted at this time. 01/08/17; patient seems dermatology on September 5. I suspect dermatology will want the slides from the biopsy I did sent to their pathologist. I'm not sure if there is a way we can expedite that. In any case the culture I did before I left on vacation 3 weeks ago showed Pseudomonas he was given 10 days of Cipro and per her description of her intake nurses is actually somewhat better this week although the wound is quite a bit bigger than I remember the last time I saw this. He still has 3 more days of Cipro 01/21/17; dermatology appointment tomorrow. He has completed the ciprofloxacin  for Pseudomonas. Surface of the wound looks better however he is had some deterioration in the lesions on his right leg. Meantime the left lateral leg wound we will continue with sample 01/29/17; patient had his dermatology appointment but I can't yet see that note. He is completed his antibiotics. The wound is more superficial but considerably larger in circumferential area than when he came in. This is in his left lateral calf. He also has swollen erythematous areas with superficial wounds on the right leg and small papular areas on both arms. There apparently areas in her his upper thighs and buttocks I did not look at those. Dermatology biopsied the right leg. Hopefully will have their input next week. 02/05/17; patient went back to see his dermatologist who told him that he had a "scratching problem" as well as staph. He is now on a 30 day course of doxycycline and I believe she gave him triamcinolone cream to the right leg areas to help with the itching [not exactly sure but probably triamcinolone]. She apparently looked at the left lateral leg wound although this was not rebiopsied and I think felt to be ultimately part of the same pathogenesis. He is using sample border foam and changing nevus himself. He now has a new open area on the right posterior leg which was his biopsy site I don't have any of the dermatology notes  02/12/17; we put the patient in compression last week with SANTYL to the wound on the left leg and the biopsy. Edema is much better and the depth of the wound is now at level of skin. Area is still the same oBiopsy site on the right lateral leg we've also been using santyl with a border foam dressing and he is changing this himself. 02/19/17; Using silver alginate started last week to both the substantial left leg wound and the biopsy site on the right wound. He is tolerating compression well. Has a an appointment with his primary M.D. tomorrow wondering about diuretics although  I'm wondering if the edema problem is actually lymphedema 02/26/17; the patient has been to see his primary doctor Dr. Jerrel Ivory at [facility]. She started him on Lasix 20 mg and this seems to have helped with the edema. However we are not making substantial change with the left lateral calf wound and inflammation. The biopsy site on the right leg also looks stable but not really all that different. 03/12/17; the patient has been to see vein and vascular Dr. Lucky Cowboy. He has had venous reflux studies I have not reviewed these. I did get a call from his dermatology office. They felt that he might have pathergy based on their biopsy on his right leg which led them to look at the slides of BURAK, ZERBE ([id number]) the biopsy I did on the left leg and they wonder whether this represents pyoderma gangrenosum which was the original supposition in a man with ulcerative colitis albeit inactive for many years. They therefore recommended clobetasol and tetracycline i.e. 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 03/26/17; apparently the patient just had reflux studies not an appointment with Dr. dew. She arrives in clinic today having applied clobetasol for 2-3 weeks. He notes over the last 2-3 days excessive drainage having to change the dressing 3-4 times a day and also expanding erythema. 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 Lillia Carmel at [facility] dermatology locally who kindly saw him at my request. 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 My concern was the degree of erythema medially extending from his left leg wound which was either inflammation from pyoderma  or cellulitis. I put him on Augmentin however culture of the wound showed Pseudomonas which is quinolone sensitive. I really don't believe he has cellulitis however in view of everything I will continue and give him a course of Cipro. He is also on doxycycline as an immune modulator for the pyoderma. In addition to his original wound on the left lateral leg with surrounding erythema he has a wound on the right posterior calf which was an original biopsy site done by dermatology. 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 Saw Dr. Lillia Carmel yesterday. He has been using topical antibiotics to both wound areas his original wound on the left and the biopsies/pathergy area on the right. There is definitely some improvement in the inflammation around the wound on the right although the patient states he has increasing sensitivity of the wounds. He is on prednisone 60 and doxycycline 1 as prescribed by Dr. Lillia Carmel. He is covering the topical antibiotic with gauze  and putting this in his own compression stocks and changing this daily. He states that Dr. Lottie Rater did a culture of the left leg wound yesterday 05/07/17; pyoderma gangrenosum. The patient saw Dr. Lillia Carmel yesterday and has a follow-up with her in one month. He is still using topical antibiotics to both wounds although he can't recall exactly what type. He is still on prednisone 60 mg. Dr. Lillia Carmel stated that the doxycycline could stop if we were in agreement. 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 The right medial leg was induced by biopsy/pathergy. The area on the right is essentially healed. 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 The right medial leg has closed and remains closed. He  is still on prednisone 60. oHe tells me he missed his last dermatology appointment with Dr. Lillia Carmel but will make another appointment. He reports that her blood sugar at a recent screen in [place] was high 200's. He was 180 today. 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 I think he is going to require still much longer prednisone perhaps another 3 months before attempting to taper. In the meantime his wound is a lot better. Smaller. He is cleaning this off daily and applying topical antibiotics. When he was last in the clinic I thought about changing to [facility] and actually put in a couple of calls to dermatology although probably not during their business hours. 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 He continues on oral prednisone. He has been using triple antibiotic ointment. There is surface debris and we will transition to [facility] and have him return in 2 weeks. He has lost 30 pounds since his last appointment with lifestyle modification. He may benefit from topical steroid cream for treatment this can be considered at a later date. 08/22/17 on evaluation today patient appears to actually be doing rather well in regard to his left lateral lower extremity ulcer. He has actually been managed by Dr. Dellia Nims most recently. Patient is currently on oral steroids at this time. This seems to have been of benefit for him. Nonetheless his last visit was actually with Leah on 08/06/17. Currently he is not utilizing any topical steroid creams although this could be of benefit as well. No fevers, chills, nausea, or vomiting noted at this time. 09/05/17 on evaluation today patient appears to be doing better in regard to his left lateral lower extremity ulcer. He has been tolerating the dressing changes without complication. He is using Santyl with good effect. Overall I'm  very pleased with how things are standing at this point. Patient likewise is happy that this is doing better. 09/19/17 on evaluation today patient actually appears to be doing rather well in regard to his left lateral lower extremity ulcer. 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 He's not having any significant pain. 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 He has been tolerating the Santyl without complication and in general I feel like he's making good progress. 10/17/17 on evaluation today patient  appears to be doing very well in regard to his left lateral lower surety ulcer. He has been tolerating the dressing changes without complication. There does not appear to be any evidence of infection he's alternating the Santyl and the triple antibiotic ointment every other day this seems to be doing well for him. 11/03/17 on evaluation today patient appears to be doing very well in regard to his left lateral lower extremity ulcer. He is been tolerating the dressing changes without complication which is good news. Fortunately there does not appear to be any evidence of infection which is also great news. Overall is doing excellent they are starting to taper down on the prednisone is down to 40 mg at this point it also started topical clobetasol for him. 11/17/17 on evaluation today patient appears to be doing well in regard to his left lateral lower surety ulcer. He's been tolerating the dressing changes without complication. He does note that he is having no pain, no excessive drainage or discharge, and overall he feels like things are going about how he would expect and hope they would. Overall he seems to have no evidence of infection at this time in my opinion which is good news. 12/04/17-He is seen in follow-up  evaluation for right lateral lower extremity ulcer. He has been applying topical steroid cream. Today's measurement show slight increase in size. Over the next 2 weeks we will transition to every other day Santyl and steroid cream. He has been encouraged to monitor for changes and notify clinic with any concerns 12/15/17 on evaluation today patient's left lateral motion the ulcer and fortunately is doing worse again at this point. This just since last week to this week has close to doubled in size according to the patient. I did not seeing last week's I do not have a visual to compare this to in our system was also down so we do not have all the charts and at this point. Nonetheless it does have me somewhat concerned in regard to the fact that again he was worried enough about it he has contact the dermatology that placed them back on the full strength, 50 mg a day of the prednisone that he was taken previous. He continues to alternate using clobetasol along with Santyl at this point. He is obviously somewhat frustrated. 12/22/17 on evaluation today patient appears to be doing a little worse compared to last evaluation. Unfortunately the wound is a little deeper and slightly larger than the last week's evaluation. With that being said he has made some progress in regard to the irritation surrounding at this time unfortunately despite that progress that's been made he still has a significant issue going on here. 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 JONN, CHAIKIN ([id number]) For that reason I discussed with him today the possibility of perform a wound culture to ensure there's nothing overtly infected. 01/06/18 on evaluation today patient's wound is larger and deeper than previously evaluated. With that being said it did appear that his wound was  infected after my last evaluation with him. Subsequently I did end up prescribing a prescription for Bactrim DS which she has been taking and having no complication with. Fortunately there does not appear to be any evidence of infection at this point in time as far as anything spreading, no want to touch, and overall I feel like  things are showing signs of improvement. 01/13/18 on evaluation today patient appears to be even a little larger and deeper than last time. There still muscle exposed in the base of the wound. Nonetheless he does appear to be less erythematous I do believe inflammation is calming down also believe the infection looks like it's probably resolved at this time based on what I'm seeing. No fevers, chills, nausea, or vomiting noted at this time. 01/30/18 on evaluation today patient actually appears to visually look better for the most part. Unfortunately those visually this looks better he does seem to potentially have what may be an abscess in the muscle that has been noted in the central portion of the wound. This is the first time that I have noted what appears to be fluctuance in the central portion of the muscle. With that being said I'm somewhat more concerned about the fact that this might indicate an abscess formation at this location. I do believe that an ultrasound would be appropriate. This is likely something we need to try to do as soon as possible. He has been switch to mupirocin ointment and he is no longer using the steroid ointment as prescribed by dermatology he sees them again next week he's been decreased from 60 to 40 mg of prednisone. 03/09/18 on evaluation today patient actually appears to be doing a little better compared to last time I saw him. There's not as much erythema surrounding the wound itself. He I did review his most recent infectious disease note which was dated 02/24/18. He saw Dr. Michel Bickers in [place]. 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 Patient is now off of antibiotics and they are continually observing this. There seems to be no change in the past two weeks in my pinion based on what the patient says and what I see today compared to what Dr. Megan Salon likely saw two weeks ago. No fevers, chills, nausea, or vomiting noted at this time. 03/23/18 on evaluation today patient's wound actually appears to be showing signs of improvement which is good news. He is currently still on the Dapsone. He is also working on tapering the prednisone to get off of this and Dr. Lottie Rater is working with him in this regard. Nonetheless overall I feel like the wound is doing well it does appear based on the infectious disease note that I reviewed from Dr. 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 I did also review the results of his ultrasound of left lower extremity which revealed there was a dentist tissue in the base of the wound without an abscess noted. 04/06/18 on evaluation today the patient's left lateral lower extremity ulcer actually appears to be doing fairly well which is excellent news. There does not appear to be any evidence of infection at this time which is also great news. Overall he still does have a significantly large ulceration although little by little he seems to be making progress. He is down to 10 mg a day of the prednisone. 04/20/18 on evaluation today patient actually appears to be doing excellent at this time in regard to his left lower extremity ulcer. He's making signs of good progress unfortunately this is taking much longer than we would really like to see but nonetheless he is  making progress. Fortunately there does not appear to be any evidence of infection at this time. No fevers, chills, nausea, or vomiting noted at  this time. The patient has not been using the Santyl due to the cost he hadn't got in this field yet. He's mainly been using the antibiotic ointment topically. Subsequently he also tells me that he really has not been scrubbing in the shower I think this would be helpful again as I told him it doesn't have to be anything too aggressive to even make it believe just enough to keep it free of some of the loose slough and biofilm on the wound surface. 05/11/18 on evaluation today patient's wound appears to be making slow but sure progress in regard to the left lateral lower extremity ulcer. He is been tolerating the dressing changes without complication. Fortunately there does not appear to be any evidence of infection at this time. He is still just using triple antibiotic ointment along with clobetasol occasionally over the area. He never got the Santyl and really does not seem to intend to in my pinion. 06/01/18 on evaluation today patient appears to be doing a little better in regard to his left lateral lower extremity ulcer. He states that overall he does not feel like he is doing as well with the Dapsone as he did with the prednisone. Nonetheless he sees his dermatologist later today and is gonna talk to them about the possibility of going back on the prednisone. Overall again I believe that the wound would be better if you would utilize Santyl but he really does not seem to be interested in going back to the [place] at this point. He has been using triple antibiotic ointment. 06/15/18 on evaluation today patient's wound actually appears to be doing about the same at this point. Fortunately there is no signs of infection at this time. He has made slight improvements although he continues to not really want to clean the wound bed at this point. He states that he just doesn't mess with it he doesn't want to cause any problems with everything else he has going on. He has been on medication, antibiotics  as prescribed by his dermatologist, for a staff infection of his lower extremities which is really drying out now and looking much better he tells me. Fortunately there is no sign of overall infection. 06/29/18 on evaluation today patient appears to be doing well in regard to his left lateral lower surety ulcer all things considering. Fortunately his staff infection seems to be greatly improved compared to previous. He has no signs of infection and this is drying up quite nicely. He is still the doxycycline for this is no longer on cental, Dapsone, or any of the other medications. His dermatologist has recommended possibility of an infusion but right now he does not want to proceed with that. 07/13/18 on evaluation today patient appears to be doing about the same in regard to his left lateral lower surety ulcer. Fortunately there's no signs of infection at this time which is great news. Unfortunately he still builds up a significant amount of Slough/biofilm of the surface of the wound he still is not really cleaning this as he should be appropriately. Again I'm able to easily with saline and gauze remove the majority of this on the surface which if you would do this at home would likely be a dramatic improvement for him as far as getting the area to improve. Nonetheless overall I still feel like he  is making progress is just very slow. I think Santyl will be of benefit for him as well. Still he has not gotten this as of this point. 07/27/18 on evaluation today patient actually appears to be doing little worse in regards of the erythema around the periwound region of the wound he also tells me that he's been having more drainage currently compared to what he was experiencing last time I saw him. He states not quite as bad as what he had because this was infected previously but nonetheless is still appears to be doing poorly. Fortunately there is no evidence of systemic infection at this point. The patient  tells me that he is not going to be able to afford the Santyl. He is still waiting to hear about the infusion therapy with his dermatologist. Apparently she wants an updated colonoscopy first. 08/10/18 on evaluation today patient appears to be doing better in regard to his left lateral lower extremity ulcer. Fortunately he is showing signs of improvement in this regard he's actually been approved for Remicade infusion's as well although this has not been scheduled as of yet. Fortunately there's no signs of active infection at this time in regard to the wound although he is having some issues with infection of the right lower extremity is been seen as dermatologist for this. Fortunately they are definitely still working with him trying to keep things under control. EBAN, WEICK ([id number]) 09/07/18 on evaluation today patient is actually doing rather well in regard to his left lateral lower extremity ulcer. He notes these actually having some hair grow back on his extremity which is something he has not seen in years. He also tells me that the pain is really not giving them any trouble at this time which is also good news overall she is very pleased with the progress he's using a combination of the mupirocin along with the probate is all mixed. 09/21/18 on evaluation today patient actually appears to be doing fairly well all things considered in regard to his looks from the ulcer. He's been tolerating the dressing changes without complication. Fortunately there's no signs of active infection at this time which is good news he is still on all antibiotics or prevention of the staff infection. He has been on prednisone for time although he states it is gonna contact his dermatologist and see if she put them on a short course due to some irritation that he has going on currently. Fortunately there's no evidence of any overall worsening this is going very slow I think cental would be something that would be  helpful for him although he states that $50 for tube is quite expensive. He therefore is not willing to get that at this point. 10/06/18 on evaluation today patient actually appears to be doing decently well in regard to his left lateral leg ulcer. He's been tolerating the dressing changes without complication. Fortunately there's no signs of active infection at this time. Overall I'm actually rather pleased with the progress he's making although it's slow he doesn't show any signs of infection and he does seem to be making some improvement. I do believe that he may need a switch up and dressings to try to help this to heal more appropriately and quickly. 10/19/18 on evaluation today patient actually appears to be doing better in regard to his left lateral lower extremity ulcer. This is shown signs of having much less Slough buildup at this point due to the fact he has been using  the Santyl. Obviously this is very good news. The overall size of the wound is not dramatically smaller but again the appearance is. 11/02/18 on evaluation today patient actually appears to be doing quite well in regard to his lower [place] ulcer. A lot of the skin around the ulcer is actually somewhat irritating at this point this seems to be more due to the dressing causing irritation from the adhesive that anything else. Fortunately there is no signs of active infection at this time. 11/24/18 on evaluation today patient appears to be doing a little worse in regard to his overall appearance of his lower extremity ulcer. There's more erythema and warmth around the wound unfortunately. He is currently on doxycycline which he has been on for some time. With that being said I'm not sure that seems to be helping with what appears to possibly be an acute cellulitis with regard to his left lower extremity ulcer. No fevers, chills, nausea, or vomiting noted at this time. 12/08/18 on evaluation today patient's wounds actually appears to  be doing significantly better compared to his last evaluation. He has been using Santyl along with alternating tripling about appointment as well as the steroid cream seems to be doing quite well and the wound is showing signs of improvement which is excellent news. Fortunately there's no evidence of infection and in fact his culture came back negative with only normal skin flora noted. 12/21/2018 upon evaluation today patient actually appears to be doing excellent with regard to his ulcer. This is actually the best that I have seen it since have been helping to take care of him. It is both smaller as well as less slough noted on the surface of the wound and seems to be showing signs of good improvement with new skin growing from the edges. He has been using just the triamcinolone he does wonder if he can get a refill of that ointment today. 01/04/2019 upon evaluation today patient actually appears to be doing well with regard to his left lateral lower extremity ulcer. With that being said it does not appear to be that he is doing quite as well as last time as far as progression is concerned. There does not appear to be any signs of infection or significant irritation which is good news. With that being said I do believe that he may benefit from switching to a collagen based dressing based on how clean The wound appears. 01/18/2019 on evaluation today patient actually appears to be doing well with regard to his wound on the left lower extremity. He is not made a lot of progress compared to where we were previous but nonetheless does seem to be doing okay at this time which is good news. There is no signs of active infection which is also good news. My only concern currently is I do wish we can get him into utilizing the collagen dressing his insurance would not pay for the supplies that we ordered although it appears that he may be able to order this through his supply company that he typically utilizes.  This is [employer]. Nonetheless he did try to order it during the office visit today and it appears this did go through. We will see if he can get that it is a different brand but nonetheless he has collagen and I do think will be beneficial. 02/01/2019 on evaluation today patient actually appears to be doing a little worse today in regard to the overall size of his wounds. Fortunately there  is no signs of active infection at this time. That is visually. Nonetheless when this is happened before it was due to infection. For that reason were somewhat concerned about that this time as well. 02/08/2019 on evaluation today patient unfortunately appears to be doing slightly worse with regard to his wound upon evaluation today. Is measuring a little deeper and a little larger unfortunately. I am not really sure exactly what is causing this to enlarge he actually did see his dermatologist she is going to see about initiating Humira for him. Subsequently she also did do steroid injections into the wound itself in the periphery. Nonetheless still nonetheless he seems to be getting a little bit larger he is gone back to just using the steroid cream topically which I think is appropriate. I would say hold off on the collagen for the time being is definitely a good thing to do. Based on the culture results which we finally did get the final result back regarding it shows staph as the bacteria noted again that can be a normal skin bacteria based on the fact however he is having increased drainage and worsening of the wound measurement wise I would go ahead and place him on an antibiotic today I do believe for this. 02/15/2019 on evaluation today patient actually appears to be doing somewhat better in regard to his ulcer. There is no signs of worsening at this time I did review his culture results which showed evidence of Staphylococcus aureus but not MRSA. Again this could just be more related to the normal skin  bacteria although he states the drainage has slowed down quite a bit he may have had a mild infection not just colonization. And was much smaller and then since around10/04/2019 on evaluation today patient appears to be doing unfortunately worse as far as the size of the wound. I really feel like that this is steadily getting larger again it had been doing excellent right at the beginning of September we have seen a steady increase in the area of the wound it is almost 2-1/2 times the size it was on September 1. Obviously this is a bad trend this is not wanting to see. For that reason we went back to using just the topical triamcinolone cream which does seem to help with inflammation. I checked him for bacteria by way of culture and nothing showed positive there. I am considering giving him a short course of a tapering steroid Dosepak today to see if that is can be beneficial for him. The patient is in agreement with giving that a try. 03/08/2019 on evaluation today patient appears to be doing very well in comparison to last evaluation with regard to his lower extremity ulcer. This is showing signs of less inflammation and actually measuring slightly smaller compared to last time every other week over the past month and a half he has been measuring larger larger larger. Nonetheless I do believe that the issue has been inflammation the prednisone does seem to [facility], [place] ([id number]) have been beneficial for him which is good news. No fevers, chills, nausea, vomiting, or diarrhea. 03/22/2019 on evaluation today patient appears to be doing about the same with regard to his leg ulcer. He has been tolerating the dressing changes without complication. With that being said the wound seems to be mostly arrested at its current size but really is not making any progress except for when we prescribed the prednisone. He did show some signs of dropping as far as  the overall size of the wound during that interval  week. Nonetheless this is something he is not on long-term at this point and unfortunately I think he is getting need either this or else the Humira which his dermatologist has discussed try to get approval for. With that being said he will be seeing his dermatologist on the 11th of this month that is November. 04/19/2019 on evaluation today patient appears to be doing really about the same the wound is measuring slightly larger compared to last time I saw him. He has not been into the office since November 2 due to the fact that he unfortunately had Covid as that his entire family. He tells me that it was rough but they did pull-through and he seems to be doing much better. Fortunately there is no signs of active infection at this time. No fevers, chills, nausea, vomiting, or diarrhea. 05/10/2019 on evaluation today patient unfortunately appears to be doing significantly worse as compared to last time I saw him. He does tell me that he has had his first dose of Humira and actually is scheduled to get the next one in the upcoming week. With that being said he tells me also that in the past several days he has been having a lot of issues with green drainage she showed me a picture this is more blue-green in color. He is also been having issues with increased sloughy buildup and the wound does appear to be larger today. Obviously this is not the direction that we want everything to take based on the starting of his Humira. Nonetheless I think this is definitely a result of likely infection and to be honest I think this is probably Pseudomonas causing the infection based on what I am seeing. 05/24/2019 on evaluation today patient unfortunately appears to be doing significantly worse compared to his prior evaluation with me 2 weeks ago. I did review his culture results which showed that he does have Staph aureus as well as Pseudomonas noted on the culture. Nonetheless the Levaquin that I prescribed for him  does not appear to have been appropriate and in fact he tells me he is no longer experiencing the green drainage and discharge that he had at the last visit. Fortunately there is no signs of active infection at this time which is good news although the wound has significantly worsened it in fact is much deeper than it was previous. We have been utilizing up to this point triamcinolone ointment as the prescription topical of choice but at this time I really feel like that the wound is getting need to be packed in order to appropriately manage this due to the deeper nature of the wound. Therefore something along the lines of an alginate dressing may be more appropriate. 05/31/2019 upon inspection today patient's wound actually showed signs of doing poorly at this point. Unfortunately he just does not seem to be making any good progress despite what we have tried. He actually did go ahead and pick up the Cipro and start taking that as he was noticing more green drainage he had previously completed the Levaquin that I prescribed for him as well. Nonetheless he missed his appointment for the seventh last week on Wednesday with the wound care center and [facility] where his dermatologist referred him. Obviously I do think a second opinion would be helpful at this point especially in light of the fact that the patient seems to be doing so poorly despite the fact  that we have tried everything that I really know how at this point. The only thing that ever seems to have helped him in the past is when he was on high doses of continual steroids that did seem to make a difference for him. 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 06/07/2019 upon evaluation today patient unfortunately appears to be doing worse yet again with regard to his wound. In fact I  am starting to question whether or not he may have a fluid pocket in the muscle at this point based on the bulging and the soft appearance to the central portion of the muscle area. There is not anything draining from the muscle itself at this time which is good news but nonetheless the wound is expanding. I am not really seeing any results of the Humira as far as overall wound progression based on what I am seeing at this point. The patient has been referred for second opinion with regard to his wound to the [facility] wound care center by his dermatologist which I definitely am not in opposition to. Unfortunately we tried multiple dressings in the past including collagen, alginate, and at one point even Hydrofera Blue. With that being said he is never really used it for any significant amount of time due to the fact that he often complains of pain associated with these dressings and then will go back to either using the Santyl which she has done intermittently or more frequently the triamcinolone. He is also using his own compression stockings. We have wrapped him in the past but again that was something else that he really was not a big fan of. 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 He seemed to do much better when he was on the steroids although honestly I understand that the steroids are not necessarily the best medication to be on long-term obviously 06/14/2019 on evaluation today patient appears to be doing actually a little bit better with regard to the overall appearance with his leg. Unfortunately he does continue to have issues with what appears to be some fluid underneath  the muscle although he did see the wound specialty center at [facility] last week their main goals were to see about infusion therapy in place of the Humira as they feel like that is not quite strong enough. They also recommended that we continue with the treatment otherwise as we are they felt like that was appropriate and they are okay with him continuing to follow-up here with [place] in that regard. With that being said they are also sending him to the vein specialist there to see about vein stripping and if that would be of benefit for him. Subsequently they also did not really address whether or not an ultrasound of the muscle area to see if there is anything that needs to be addressed here would be appropriate or not. For that reason I discussed this with him last week I think we  may proceed down that road at this point. 06/21/2019 upon evaluation today patient's wound actually appears to be doing slightly better compared to previous evaluations. I do believe that he has made a difference with regard to the progression here with the use of oral steroids. Again in the past has been the only thing that is really calm things down. He does tell me that from [facility] is gotten a good news from there that there are no further vein stripping that is necessary at this point. I do not have that available for review today although the patient did relay this to me. He also did obtain and have the ultrasound of the wound completed which I did sign off on today. It does appear that there is no fluid collection under the muscle this is likely then just edematous tissue in general. That is also good news. Overall I still believe the inflammation is the main issue here. He did inquire about the possibility of a wound VAC again with the muscle protruding like it is I am not really sure whether the wound VAC is necessarily ideal or not. That is something we will have to consider although I do believe he may need compression  wrapping to try to help with edema control which could potentially be of benefit. 06/28/2019 on evaluation today patient appears to be doing slightly better measurement wise although this is not terribly smaller he least seems to be trending towards that direction. With that being said he still seems to have purulent drainage noted in the wound bed at this time. He has been on Levaquin followed by Cipro over the past month. Unfortunately he still seems to have some issues with active infection at this time. I did perform a culture last week in order to evaluate and see if indeed there was still anything going on. Subsequently the culture did come back showing Pseudomonas which is consistent with the drainage has been having which is blue-green in color. He also has had an odor that again was somewhat consistent with Pseudomonas as well. Long story short it appears that the culture showed an intermediate finding with regard to how well the Cipro will work for the Pseudomonas infection. Subsequently being that he does not seem to be clearing up and at best what we are doing is just keeping this at [place] I think he may need to see infectious disease to discuss IV antibiotic options. MONTAVIOUS, WIERZBA ([id number]) 07/05/2019 upon evaluation today patient appears to be doing okay in regard to his leg ulcer. He has been tolerating the dressing changes at this point without complication. Fortunately there is no signs of active infection at this time which is good news. No fevers, chills, nausea, vomiting, or diarrhea. With that being said he does have an appointment with infectious disease tomorrow and his primary care on Wednesday. Again the reason for the infectious disease referral was due to the fact that he did not seem to be fully resolving with the use of oral antibiotics and therefore we were thinking that IV antibiotic therapy may be necessary secondary to the fact that there was an intermediate finding for  how effective the Cipro may be. Nonetheless again he has been having a lot of purulent and even green drainage. Fortunately right now that seems to have calmed down over the past week with the reinitiation of the oral antibiotic. Nonetheless we will see what Dr. Megan Salon has to say. 07/12/2019 upon evaluation today patient appears to be  doing about the same at this point in regard to his left lower extremity ulcer. Fortunately there is no signs of active infection at this time which is good news I do believe the Levaquin has been beneficial I did review Dr. Hale Bogus note and to be honest I agree that the patient's leg does appear to be doing better currently. What we found in the past as he does not seem to really completely resolve he will stop the antibiotic and then subsequently things will revert back to having issues with blue-green drainage, increased pain, and overall worsening in general. Obviously that is the reason I sent him back to infectious disease. 07/19/2019 upon evaluation today patient appears to be doing roughly the same in size there is really no dramatic improvement. He has started back on the Levaquin at this point and though he seems to be doing okay he did still have a lot of blue/green drainage noted on evaluation today unfortunately. I think that this is still indicative more likely of a Pseudomonas infection as previously noted and again he does see Dr. Megan Salon in just a couple of days. I do not know that were really able to effectively clear this with just oral antibiotics alone based on what I am seeing currently. Nonetheless we are still continue to try to manage as best we can with regard to the patient and his wound. I do think the wrap was helpful in decreasing the edema which is excellent news. No fevers, chills, nausea, vomiting, or diarrhea. 07/26/2019 upon evaluation today patient appears to be doing slightly better with regard to the overall appearance of the muscle  there is no dark discoloration centrally. Fortunately there is no signs of active infection at this time. No fevers, chills, nausea, vomiting, or diarrhea. Patient's wound bed currently the patient did have an appointment with Dr. Megan Salon at infectious disease last week. With that being said Dr. Megan Salon the patient states was still somewhat hesitant about put him on any IV antibiotics he wanted [place] to repeat cultures today and then see where things go going forward. He does look like Dr. Megan Salon because of some improvement the patient did have with the Levaquin wanted [place] to see about repeating cultures. If it indeed grows the Pseudomonas again then he recommended a possibility of considering a PICC line placement and IV antibiotic therapy. He plans to see the patient back in 1 to 2 weeks. 08/02/2019 upon evaluation today patient appears to be doing poorly with regard to his left lower extremity. We did get the results of his culture back it shows that he is still showing evidence of Pseudomonas which is consistent with the purulent/blue-green drainage that he has currently. Subsequently the culture also shows that he now is showing resistance to the oral fluoroquinolones which is unfortunate as that was really the only thing to treat the infection prior. I do believe that he is looking like this is going require IV antibiotic therapy to get this under control. Fortunately there is no signs of systemic infection at this time which is good news. The patient does see Dr. Megan Salon tomorrow. 08/09/2019 upon evaluation today patient appears to be doing better with regard to his left lower extremity ulcer in regard to the overall appearance. He is currently on IV antibiotic therapy. As ordered by Dr. Megan Salon. Currently the patient is on ceftazidime which she is going to take for the next 2 weeks and then follow-up for 4 to 5-week appointment with Dr. Megan Salon.  The patient started this this past Friday  symptoms have not for a total of 3 days currently in full. 08/16/2019 upon evaluation today patient's wound actually does show muscle in the base of the wound but in general does appear to be much better as far as the overall evidence of infection is concerned. In fact I feel like this is for the most part cleared up he still on the IV antibiotics he has not completed the full course yet but I think he is doing much better which is excellent news. 08/23/2019 upon evaluation today patient appears to be doing about the same with regard to his wound at this point. He tells me that he still has pain unfortunately. Fortunately there is no evidence of systemic infection at this time which is great news. There is significant muscle protrusion. 09/13/19 upon evaluation today patient appears to be doing about the same in regard to his leg unfortunately. He still has a lot of drainage coming from the ulceration there is still muscle exposed. With that being said the patient's last wound culture still showed an intermediate finding with regard to the Pseudomonas he still having the bluish/green drainage as well. Overall I do not know that the wound has completely cleared of infection at this point. Fortunately there is no signs of active infection systemically at this point which is good news. 09/20/2019 upon evaluation today patient's wound actually appears to be doing about the same based on what I am seeing currently. I do not see any signs of systemic infection he still does have evidence of some local infection and drainage. He did see Dr. Megan Salon last week and Dr. Megan Salon states that he probably does need a different IV antibiotic although he does not want to put him on this until the patient begins the Remicade infusion which is actually scheduled for about 10 days out from today on 13 May. Following that time Dr. Megan Salon is good to see him back and then will evaluate the feasibility of starting him on the  IV antibiotic therapy once again at that point. I do not disagree with this plan I do believe as Dr. Megan Salon stated in his note that I reviewed today that the patient's issue is multifactorial with the pyoderma being 1 aspect of this that were hoping the Remicade will be helpful for her. In the meantime I think the gentamicin is, helping to keep things under decent okay control in regard to the ulcer. 09/27/2019 upon evaluation today patient appears to be doing about the same with regard to his wound still there is a lot of muscle exposure though he does have some hyper granulation tissue noted around the edge and actually some granulation tissue starting to form over the muscle which is actually good news. Fortunately there is no evidence of active infection which is also good news. His pain is less at this point. 5/21; this is a patient I have not seen in a long time. 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 He has a large wound on the left lateral leg with protruding muscle. He comes in the clinic today showing the same area on his left medial ankle. He says there is been a spot there for some time although we have not previously defined this. Today he has a clearly defined area with slight amount of skin breakdown surrounded by raised areas with a purplish hue in color. This is not painful he says it is irritated.  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 He still has muscle protruding from the lateral portion of his left leg but fortunately the new area that they were concerned about at his last visit does not appear to have opened at this point. He is currently on Remicade infusions and seems to be doing better in my opinion in fact the wound itself seems to be overall much better. The purplish discoloration that he did have seems to have resolved  and I think that is a good sign that hopefully the Remicade is doing its job. He does have some biofilm noted over the surface of the wound. 11/01/2019 on evaluation today patient's wound actually appears to be doing excellent at this time. Fortunately there is no evidence of active infection and overall I feel like he is making great progress. The Remicade seems to be due excellent job in my opinion. EMIGDIO, WILDEMAN ([id number]) 11/08/19 evaluation today vision actually appears to be doing quite well with regard to his weight ulcer. He's been tolerating dressing changes without complication. Fortunately there is no evidence of infection. No fevers, chills, nausea, or vomiting noted at this time. Overall states that is having more itching than pain which is actually a good sign in my opinion. 12/13/2019 upon evaluation today patient appears to be doing well today with regard to his wound. He has been tolerating the dressing changes without complication. Fortunately there is no sign of active infection at this time. No fevers, chills, nausea, vomiting, or diarrhea. Overall I feel like the infusion therapy has been very beneficial for him. 01/06/2020 on evaluation today patient appears to be doing well with regard to his wound. This is measuring smaller and actually looks to be doing better. Fortunately there is no signs of active infection at this point. No fevers, chills, nausea, vomiting, or diarrhea. With that being said he does still have the blue-green drainage but this does not seem to be causing any significant issues currently. He has been using the gentamicin that does seem to be keeping things under decent control at this point. He goes later this morning for his next infusion therapy for the pyoderma which seems to also be very beneficial. 02/07/2020 on evaluation today patient appears to be doing about the same in regard to his wounds currently. Fortunately there is no signs of active infection  systemically he does still have evidence of local infection still using gentamicin. He also is showing some signs of improvement albeit slowly I do feel like we are making some progress here. 02/21/2020 upon evaluation today patient appears to be making some signs of improvement the wound is measuring a little bit smaller which is great news and overall I am very pleased with where he stands currently. He is going to be having infusion therapy treatment on the 15th of this month. Fortunately there is no signs of active infection at this time. 03/13/2020 I do believe patient's wound is actually showing some signs of improvement here which is great news. He has continue with the infusion therapy through rheumatology/dermatology at [facility] That does seem to be beneficial. I still think he gets as much benefit from this as he did from the prednisone initially but nonetheless obviously this is less harsh on his body that the prednisone as far as they are concerned. 03/31/2020 on evaluation today patient's wound actually showing signs of some pretty good improvement in regard  to the overall appearance of the wound bed. There is still muscle exposed though he does have some epithelial growth around the edges of the wound. Fortunately there is no signs of active infection at this time. No fevers, chills, nausea, vomiting, or diarrhea. 04/24/2020 upon evaluation today patient appears to be doing about the same in regard to his leg ulcer. He has been tolerating the dressing changes without complication. Fortunately there is no signs of active infection at this time. No fevers, chills, nausea, vomiting, or diarrhea. With that being said he still has a lot of irritation from the bandaging around the edges of the wound. We did discuss today the possibility of a referral to plastic surgery. 05/22/2020 on evaluation today patient appears to be doing well with regard to his wounds all things considered. He has not been able  to get the Chantix apparently there is a recall nurse that I was unaware of put out by [employer] involuntarily. Nonetheless for now I am and I have to do some research into what may be the best option for him to help with quitting in regard to smoking and we discussed that today. 06/26/2020 upon evaluation today patient appears to be doing well with regard to his wound from the standpoint of infection I do not see any signs of infection at this point. With that being said unfortunately he is still continuing to have issues with muscle exposure and again he is not having a whole lot of new skin growth unfortunately. There does not appear to be any signs of active infection at this time. No fevers, chills, nausea, vomiting, or diarrhea. 07/10/2020 upon evaluation today patient appears to be doing a little bit more poorly currently compared to where he was previous. I am concerned currently about an active infection that may be getting worse especially in light of the increased size and tenderness of the wound bed. No fevers, chills, nausea, vomiting, or diarrhea. 07/24/2020 upon evaluation today patient appears to be doing poorly in regard to his leg ulcer. He has been tolerating the dressing changes without complication but unfortunately is having a lot of discomfort. 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 Subsequently I think he is going require possibly IV antibiotics to get this under control. I am very concerned about the severity of his infection and the amount of discomfort he is having. 07/31/2020 upon evaluation today patient appears to be doing about the same in regard to his leg wound. He did see Dr. Megan Salon and Dr. Megan Salon is actually going to start him on IV antibiotics. He goes for the PICC line tomorrow. With that being said there do not have that run for 2 weeks and then see how things are doing and depending on how he  is progressing they may extend that a little longer. Nonetheless I am glad this is getting ready to be in place and definitely feel it may help the patient. In the meantime is been using mainly triamcinolone to the wound bed has an anti-inflammatory. 08/07/2020 on evaluation today patient appears to be doing well with regard to his wound compared even last week. In the interim he has gotten the PICC line placed and overall this seems to be doing excellent. There does not appear to be any evidence of infection which is great news systemically although locally of course has had the infection this appears to be improving with the use of the antibiotics. 08/14/2020 upon  evaluation today patient's wound actually showing signs of excellent improvement. Overall the irritation has significantly improved the drainage is back down to more of a normal level and his pain is really pretty much nonexistent compared to what it was. Obviously I think that this is significantly improved secondary to the IV antibiotic therapy which has made all the difference in the world. Again he had a resistant form of Pseudomonas for which oral antibiotics just was not cutting it. Nonetheless I do think that still we need to consider the possibility of a surgical closure for this wound is been open so long and to be honest with muscle exposed I think this can be very hard to get this to close outside of this although definitely were still working to try to do what we can in that regard. 08/21/2020 upon evaluation today patient appears to be doing very well with regard to his wounds on the left lateral lower extremity/calf area. Fortunately there does not appear to be signs of active infection which is great news and overall very pleased with where things stand today. He is actually wrapping up his treatment with IV antibiotics tomorrow. After that we will see where things go from there. 08/28/2020 upon evaluation today patient appears  to be doing decently well with regard to his leg ulcer. There does not appear to be any signs of active infection which is great news and overall very pleased with where things stand today. No fevers, chills, nausea, vomiting, or diarrhea. 09/18/2020 upon evaluation today patient appears to be doing well with regard to his infection which I feel like is better. Unfortunately he is not doing as well with regard to the overall size of the wound which is not nearly as good at this point. I feel like that he may be having an issue here with the pyoderma being somewhat out of control. I think that he may benefit from potentially going back and talking to the dermatologist about MARGARITA, BOBROWSKI ([id number]) what to do from the pyoderma standpoint. I am not certain if the infusions are helping nearly as much is what the prednisone did in the past. 10/02/2020 upon evaluation today patient appears to be doing well with regard to his leg ulcer. He did go to the Psychiatric nurse. Unfortunately they feel like there is a 10% chance that most that he would be able to heal and that the skin graft would take. Obviously this has led him to not be able to go down that path as far as treatment is concerned. Nonetheless he does seem to be doing a little bit better with the prednisone that I gave him last time. I think that he may need to discuss with dermatology the possibility of long-term prednisone as that seems to be what is most helpful for him to be perfectly honest. I am not sure the Remicade is really doing the job. 10/17/2020 upon evaluation today patient appears to be doing a little better in regard to his wound. In fact the case has been since we did the prednisone on May 2 for him that we have noticed a little bit of improvement each time we have seen a size wise as well as appearance wise as well as pain wise. I think the prednisone has had a greater effect then the infusion therapy has to be perfectly honest. With  that being said the patient also feels significantly better compared to what he was previous. All of this is good news but  nonetheless I am still concerned about the fact that again we are really not set up to long-term manage him as far as prednisone is concerned. Obviously there are things that you need to be watched I completely understand the risk of prednisone usage as well. 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 With all that being said I do believe that we can give him another round of the prednisone which she is requesting today because of the improvement that he seen since we did that first round. 10/30/2020 upon evaluation today patient's wound actually is showing signs of doing quite well. There does not appear to be any evidence of infection which is great news and overall very pleased with where things stand today. No fevers, chills, nausea, vomiting, or diarrhea. He tells me that the prednisone still has seem to have helped he wonders if we can extend that for just a little bit longer. He did not have the appointment with a dermatologist although he did have an infusion appointment last Friday. That was at [facility]. With that being said he tells me he could not do both that as well as the appointment with the physician on the same day therefore that is can have to be rescheduled. I really want to see if there is anything they feel like that could be done differently to try to help this out as I am not really certain that the infusions are helping significantly here. 11/13/2020 upon evaluation today patient unfortunately appears to be doing somewhat poorly in regard to his wound I feel like this is actually worsening from the standpoint of the pyoderma spreading. I still feel like that he may need something different as far as trying to manage this going forward. Again we did the prednisone unfortunately his blood sugars are not doing so well  because of this. Nonetheless I believe that the patient likely needs to try topical steroid. We have done triamcinolone for a while I think going with something stronger such as clobetasol could be beneficial again this is not something I do lightly I discussed this with the patient that again this does not normally put underneath an occlusive dressing. Nonetheless I think a thin film as such could help with some of the stronger anti-inflammatory effects. We discussed this today. He would like to try to give this a trial for the next couple weeks. I definitely think that is something that we can do. Evaluate7/03/2021 and today patient's wound bed actually showed signs of doing really about the same. There was a little expansion of the size of the wound and that leading edge that we done looking out although the clobetasol does seem to have slowed this down a bit in my opinion. There is just 1 small area that still seems to be progressing based on what I see. Nonetheless I am concerned about the fact this does not seem to be improving if anything seems to be doing a little bit worse. I do not know that the infusions are really helping him much as next infusion is August 5 his appointment with dermatology is July 25. Either way I really think that we need to have a conversation potentially about this and I am actually going to see if I can talk with Dr. Lillia Carmel in order to see where things stand as well. 12/11/2020 upon evaluation today patient appears to be doing worse in regard to his leg ulcer. Unfortunately  I just do not think this is making the progress that I would like to see at this point. Honestly he does have an appointment with dermatology and this is in 2 days. I am wondering what they may have to offer to help with this. Right now what I am seeing is that he is continuing to show signs of worsening little by little. Obviously that is not great at all. Is the exact opposite of what we are  looking for. 12/18/2020 upon evaluation today patient appears to be doing a little better in regard to his wound. The dermatologist actually did do some steroid injections into the wound which does seem to have been beneficial in my opinion. That was on the 25th already this looks a little better to me than last time I saw him. With that being said we did do a culture and this did show that he has Staph aureus noted in abundance in the wound. With that being said I do think that getting him on an oral antibiotic would be appropriate as well. Also think we can compression wrap and this will make a difference as well. 12/28/2020 upon evaluation today patient's wound is actually showing signs of doing much better. I do believe the compression wrap is helping he has a lot of drainage but to be honest I think that the compression is helping to some degree in this regard as well as not draining through which is also good news. No fevers, chills, nausea, vomiting, or diarrhea. 01/04/2021 upon evaluation today patient appears to be doing well with regard to his wound. Overall things seem to be doing quite well. He did have a little bit of reaction to the CarboFlex Sorbact he will be using that any longer. With that being said he is controlled as far as the drainage is concerned overall and seems to be doing quite well. I do not see any signs of active infection at this time which is great news. No fevers, chills, nausea, vomiting, or diarrhea. 01/11/2021 upon evaluation today patient appears to be doing well with regard to his wounds. He has been tolerating the dressing changes without complication. Fortunately there does not appear to be any signs of active infection at this time which is great news. Overall I am extremely pleased with where we stand currently. No fevers, chills, nausea, vomiting, or diarrhea. Where using clobetasol in the wound bed he has a lot of new skin growth which is awesome as  well. Electronic Signature(s) Signed: 01/11/2021 8:47:15 AM By: Worthy Keeler PA-C Entered By: Worthy Keeler on 01/11/2021 08:47:14 BRALIN, GARRY ([id number]) -------------------------------------------------------------------------------- Physical Exam Details Patient Name: [name] Date of Service: 01/11/2021 8:15 AM Medical Record Number: [id number] Patient Account Number: [id number] Date of Birth/Sex: November 28, 1980 (40 y.o. M) Treating RN: Dolan Amen Primary Care Provider: [name] Other Clinician: Referring Provider: [name] Treating Provider/Extender: [name] Cliche in Treatment: 214 Constitutional Obese and well-hydrated in no acute distress. Respiratory normal breathing without difficulty. Psychiatric this patient is able to make decisions and demonstrates good insight into disease process. Alert and Oriented x 3. pleasant and cooperative. Notes Upon inspection patient's wound bed actually showed signs of good granulation epithelization at this point. Fortunately there does not appear to be any signs of active infection which is great and overall I am extremely pleased. I do believe that the Levaquin has done a great job getting the Pseudomonas cleared out which is made a big difference in getting him  moving in towards a healing state. Electronic Signature(s) Signed: 01/11/2021 8:47:35 AM By: Worthy Keeler PA-C Entered By: Worthy Keeler on 01/11/2021 08:47:35 Gloeckner, Shaquel ([id number]) -------------------------------------------------------------------------------- Physician Orders Details Patient Name: [name] Date of Service: 01/11/2021 8:15 AM Medical Record Number: [id number] Patient Account Number: [id number] Date of Birth/Sex: 29-Oct-1980 (39 y.o. 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 - Next Thursday o Nurse Visit as needed - Monday Bathing/ Shower/ Hygiene o Clean wound with Normal Saline or wound cleanser. Edema Control - Lymphedema / Segmental Compressive Device / Other o Optional: One layer of unna paste to top of compression wrap (to act as an anchor). o 4 Layer Compression System Lymphedema. Medications-Please add to medication list. o P.O. Antibiotics Wound Treatment Wound #1 - Lower Leg Wound Laterality: Left, Lateral Cleanser: Soap and Water 2 x Per Week/30 Days Discharge Instructions: Gently cleanse wound with antibacterial soap, rinse and pat dry prior to dressing wounds Peri-Wound Care: Desitin Maximum Strength Ointment 4 (oz) 2 x Per Week/30 Days Topical: Clobetasol Propionate ointment 0.05%, 60 (g) tube 2 x Per Week/30 Days Discharge Instructions: Apply in undermining area and reddened area on posterior leg Primary Dressing: Calcium Alginate Dressing, 6x6 (in/in) 2 x Per Week/30 Days Discharge Instructions: Apply to wound bed Secondary Dressing: Xtrasorb Large 6x9 (in/in) 2 x Per Week/30 Days Discharge Instructions: Apply to wound as directed. Do not cut. Secondary Dressing: Zetuvit Absorbent Pad, 4x8 (in/in) 2 x Per Week/30 Days Discharge Instructions: Apply on posterior leg to collect drainage Compression Wrap: Medichoice 4 layer Compression System, 35-40 mmHG (Generic) 2 x Per Week/30 Days Discharge Instructions: Apply multi-layer wrap as directed. Add-Ons: CarboFLEX  Odor Control Dressing, 4x4 (in/in) 2 x Per Week/30 Days Discharge Instructions: PLEASE APPLY OVER THE XSORB DO NOT APPLY ON SKIN Place the fibrous (non-shiny) surface of the dressing directly onto the wound for management of malodorous wounds. Patient Medications KEETON, KASSEBAUM ([id number]) Allergies: milk, Biaxin, seasonal Notifications Medication Indication Start End clobetasol 01/11/2021 DOSE topical 0.05 % ointment - ointment topical apply a thin film to the wound bed then cover with a dressing as directed in the clinic 2 times per week Electronic Signature(s) Signed: 01/11/2021 9:01:20 AM By: Worthy Keeler PA-C Entered By: Worthy Keeler on 01/11/2021 09:01:19 [name] ([id number]) -------------------------------------------------------------------------------- Problem List Details Patient Name: [name] Date of Service: 01/11/2021 8:15 AM Medical Record Number: [id number] Patient Account Number: [id number] Date of Birth/Sex: [birth year] (39 y.o. 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 to breakdown of skin 10/08/2019 No Yes I87.2 Venous insufficiency (chronic) (peripheral) 12/04/2016 No Yes L03.116 Cellulitis of left lower limb 05/24/2019 No Yes F17.208 Nicotine dependence, unspecified, with other nicotine-induced disorders 04/24/2020 No Yes Inactive  Problems ICD-10 Code Description Active Date Inactive Date L97.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 M) Treating RN: Dolan Amen Primary Care Provider: [name] Other Clinician: Referring Provider: [name] Treating Provider/Extender: [name] Cliche in Treatment: 214 Subjective Chief Complaint Information obtained from Patient He is here in follow up evaluation for LLE pyoderma ulcer History of Present Illness (HPI) 12/04/16; 40 year old man who comes into the clinic today for review of a wound on the posterior left calf. He tells me that is been there for about a year. He is not a diabetic he does smoke half a pack per day. He was seen in the ER on 11/20/16 felt to have cellulitis around the wound and was given clindamycin. An x-ray did not show osteomyelitis. The patient initially tells me that he has a milk allergy that sets off a pruritic itching rash on his lower legs which she scratches incessantly and he thinks that's what may have set up the wound. He has been using various topical antibiotics and ointments without any effect. He works in a [employer] and is on his feet all day. He does not have a prior history of wounds however he does have the rash on both lower legs the right arm and the ventral aspect of his left arm. These are excoriations and clearly have had scratching however there are of macular looking areas on both legs including a substantial larger area on the right leg. This does not have an underlying open area. There is no blistering. The patient tells me that 2 years ago in [place] in response to the rash on his legs he saw a dermatologist who told him he had a condition  which may be pyoderma gangrenosum although I may be putting words into his mouth. He seemed to recognize this. On further questioning he admits to a 5 year history of quiesced. ulcerative colitis. He is not in any treatment for this. He's had no recent travel 12/11/16; the patient arrives today with his wound and roughly the same condition we've been using silver alginate this is a deep punched out wound with some surrounding erythema but no tenderness. Biopsy I did did not show confirmed pyoderma gangrenosum suggested nonspecific inflammation and vasculitis but does not provide an actual description of what was seen by the pathologist. I'm really not able to understand this We have also received information from the patient's dermatologist in [place] notes from April 2016. This was a doctor Agarwal-antal. 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 He was prescribed topical steroid high potency under occlusion which helped but at this point the patient did not have a deep punched out wound. 12/18/16; the patient's wound is larger in terms of surface area however this surface looks better and there is less depth. The surrounding erythema also is better. The patient states that the wrap we put on came off 2 days ago when he has been using his compression stockings. He we are in the process of getting a dermatology consult. 12/26/16 on evaluation today patient's left lower extremity wound shows evidence of infection with surrounding erythema noted. He has been tolerating the dressing  changes but states that he has noted more discomfort. There is a larger area of erythema surrounding the wound. No fevers, chills, nausea, or vomiting noted at this time. With that being said the wound still does have slough covering the surface. He is not allergic to any medication that he is aware of at this point. In regard to his right lower extremity he had several regions that are erythematous and pruritic he  wonders if there's anything we can do to help that. 01/02/17 I reviewed patient's wound culture which was obtained his visit last week. He was placed on doxycycline at that point. Unfortunately that does not appear to be an antibiotic that would likely help with the situation however the pseudomonas noted on culture is sensitive to Cipro. Also unfortunately patient's wound seems to have a large compared to last week's evaluation. Not severely so but there are definitely increased measurements in general. He is continuing to have discomfort as well he writes this to be a seven out of 10. In fact he would prefer me not to perform any debridement today due to the fact that he is having discomfort and considering he has an active infection on the little reluctant to do so anyway. No fevers, chills, nausea, or vomiting noted at this time. 01/08/17; patient seems dermatology on September 5. I suspect dermatology will want the slides from the biopsy I did sent to their pathologist. I'm not sure if there is a way we can expedite that. In any case the culture I did before I left on vacation 3 weeks ago showed Pseudomonas he was given 10 days of Cipro and per her description of her intake nurses is actually somewhat better this week although the wound is quite a bit bigger than I remember the last time I saw this. He still has 3 more days of Cipro 01/21/17; dermatology appointment tomorrow. He has completed the ciprofloxacin for Pseudomonas. Surface of the wound looks better however he is had some deterioration in the lesions on his right leg. Meantime the left lateral leg wound we will continue with sample 01/29/17; patient had his dermatology appointment but I can't yet see that note. He is completed his antibiotics. The wound is more superficial but considerably larger in circumferential area than when he came in. This is in his left lateral calf. He also has swollen erythematous areas with superficial wounds on  the right leg and small papular areas on both arms. There apparently areas in her his upper thighs and buttocks I did not look at those. Dermatology biopsied the right leg. Hopefully will have their input next week. 02/05/17; patient went back to see his dermatologist who told him that he had a "scratching problem" as well as staph. He is now on a 30 day course of doxycycline and I believe she gave him triamcinolone cream to the right leg areas to help with the itching [not exactly sure but probably triamcinolone]. She apparently looked at the left lateral leg wound although this was not rebiopsied and I think felt to be ultimately part of the same pathogenesis. He is using sample border foam and changing nevus himself. He now has a new open area on the right posterior leg which was his biopsy site I don't have any of the dermatology notes 02/12/17; we put the patient in compression last week with SANTYL to the wound on the left leg and the biopsy. Edema is much better and the depth of the wound is now at  level of skin. Area is still the same Biopsy site on the right lateral leg we've also been using santyl with a border foam dressing and he is changing this himself. 02/19/17; Using silver alginate started last week to both the substantial left leg wound and the biopsy site on the right wound. He is tolerating compression well. Has a an appointment with his primary M.D. tomorrow wondering about diuretics although I'm wondering if the edema problem is actually lymphedema PASHA, BROAD ([id number]) 02/26/17; the patient has been to see his primary doctor Dr. Jerrel Ivory at [facility]. She started him on Lasix 20 mg and this seems to have helped with the edema. However we are not making substantial change with the left lateral calf wound and inflammation. The biopsy site on the right leg also looks stable but not really all that different. 03/12/17; the patient has been to see vein and  vascular Dr. Lucky Cowboy. He has had venous reflux studies I have not reviewed these. I did get a call from his dermatology office. 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 They therefore recommended clobetasol and tetracycline i.e. 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 03/26/17; apparently the patient just had reflux studies not an appointment with Dr. dew. She arrives in clinic today having applied clobetasol for 2-3 weeks. He notes over the last 2-3 days excessive drainage having to change the dressing 3-4 times a day and also expanding erythema. 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 Lillia Carmel at [facility] dermatology locally who kindly saw him at my request. 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 My concern was the degree of erythema medially extending from his left leg wound which was either inflammation from pyoderma or cellulitis. I put him on Augmentin however culture of the wound showed Pseudomonas which is quinolone sensitive. I really don't believe he has cellulitis however in view of everything I will continue and give him a course of Cipro. He is also on doxycycline as an immune modulator for the pyoderma. In addition to his original wound on the left lateral leg with surrounding erythema he has a wound on the right posterior calf which was an original biopsy site done by dermatology. This was  felt to represent pathergy from pyoderma gangrenosum 04/16/17; pyoderma gangrenosum. Saw Dr. Lillia Carmel yesterday. He has been using topical antibiotics to both wound areas his original wound on the left and the biopsies/pathergy area on the right. There is definitely some improvement in the inflammation around the wound on the right although the patient states he has increasing sensitivity of the wounds. He is on prednisone 60 and doxycycline 1 as prescribed by Dr. Lillia Carmel. He is covering the topical antibiotic with gauze and putting this in his own compression stocks and changing this daily. He states that Dr. Lottie Rater did a culture of the left leg wound yesterday 05/07/17; pyoderma gangrenosum. The patient saw Dr. Lillia Carmel  yesterday and has a follow-up with her in one month. He is still using topical antibiotics to both wounds although he can't recall exactly what type. He is still on prednisone 60 mg. Dr. Lillia Carmel stated that the doxycycline could stop if we were in agreement. 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 The right medial leg was induced by biopsy/pathergy. The area on the right is essentially healed. 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 The right medial leg has closed and remains closed. He is still on prednisone 60. He tells me he missed his last dermatology appointment with Dr. Lillia Carmel but will make another appointment. He reports that her blood sugar at a recent screen in [place] was high 200's. He was 180 today. 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 I think he is going to require still much longer prednisone perhaps another 3 months before attempting to taper. In the meantime his wound is a lot better. Smaller. He is cleaning this off daily and applying  topical antibiotics. When he was last in the clinic I thought about changing to [facility] and actually put in a couple of calls to dermatology although probably not during their business hours. 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 He continues on oral prednisone. He has been using triple antibiotic ointment. There is surface debris and we will transition to [facility] and have him return in 2 weeks. He has lost 30 pounds since his last appointment with lifestyle modification. He may benefit from topical steroid cream for treatment this can be considered at a later date. 08/22/17 on evaluation today patient appears to actually be doing rather well in regard to his left lateral lower extremity ulcer. He has actually been managed by Dr. Dellia Nims most recently. Patient is currently on oral steroids at this time. This seems to have been of benefit for him. Nonetheless his last visit was actually with Leah on 08/06/17. Currently he is not utilizing any topical steroid creams although this could be of benefit as well. No fevers, chills, nausea, or vomiting noted at this time. 09/05/17 on evaluation today patient appears to be doing better in regard to his left lateral lower extremity ulcer. He has been tolerating the dressing changes without complication. He is using Santyl with good effect. Overall I'm very pleased with how things are standing at this point. Patient likewise is happy that this is doing better. 09/19/17 on evaluation today patient actually appears to be doing rather well in regard to his left lateral lower extremity ulcer. 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 He's not having any significant pain. 10/03/17 on evaluation today patient appears to be doing excellent in regard to his lower  extremity wound on the left secondary to Pyoderma gangrenosum. He has been tolerating the Santyl without complication and in general I feel like he's making good progress. 10/17/17 on evaluation today patient appears to be doing very well in regard to his left lateral lower surety ulcer. He has been tolerating the dressing changes without complication. There does not appear to be any evidence of infection  he's alternating the Santyl and the triple antibiotic ointment every other day this seems to be doing well for him. 11/03/17 on evaluation today patient appears to be doing very well in regard to his left lateral lower extremity ulcer. He is been tolerating the dressing changes without complication which is good news. Fortunately there does not appear to be any evidence of infection which is also great news. Overall is doing excellent they are starting to taper down on the prednisone is down to 40 mg at this point it also started topical clobetasol for him. 11/17/17 on evaluation today patient appears to be doing well in regard to his left lateral lower surety ulcer. He's been tolerating the dressing changes without complication. He does note that he is having no pain, no excessive drainage or discharge, and overall he feels like things are going about how he would expect and hope they would. Overall he seems to have no evidence of infection at this time in my opinion which is good news. 12/04/17-He is seen in follow-up evaluation for right lateral lower extremity ulcer. He has been applying topical steroid cream. Today's measurement show slight increase in size. Over the next 2 weeks we will transition to every other day Santyl and steroid cream. He has been encouraged to monitor for changes and notify clinic with any concerns 12/15/17 on evaluation today patient's left lateral motion the ulcer and fortunately is doing worse again at this point. This just since last week to this week has close to  doubled in size according to the patient. I did not seeing last week's I do not have a visual to compare this to in our system was also down so we do not have all the charts and at this point. Nonetheless it does have me somewhat concerned in regard to the fact that again he was worried enough about it he has contact the dermatology that placed them back on the full strength, 50 mg a day of the prednisone that he was taken previous. He continues to alternate using clobetasol along with Santyl at this point. He is obviously somewhat frustrated. RAESHAWN, VO ([id number]) 12/22/17 on evaluation today patient appears to be doing a little worse compared to last evaluation. Unfortunately the wound is a little deeper and slightly larger than the last week's evaluation. With that being said he has made some progress in regard to the irritation surrounding at this time unfortunately despite that progress that's been made he still has a significant issue going on here. 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 For that reason I discussed with him today the possibility of perform a wound culture to ensure there's nothing overtly infected. 01/06/18 on evaluation today patient's wound is larger and deeper than previously evaluated. With that being said it did appear that his wound was infected after my last evaluation with him. Subsequently I did end up prescribing a prescription for Bactrim DS which she has been taking and having no complication with. Fortunately there does not appear to be any evidence of infection at this point in time as far as anything spreading, no want to touch, and overall I feel like things are showing signs of improvement. 01/13/18 on evaluation today patient appears to be even a little larger and deeper than last time. There still muscle  exposed in the base of the wound. Nonetheless  he does appear to be less erythematous I do believe inflammation is calming down also believe the infection looks like it's probably resolved at this time based on what I'm seeing. No fevers, chills, nausea, or vomiting noted at this time. 01/30/18 on evaluation today patient actually appears to visually look better for the most part. Unfortunately those visually this looks better he does seem to potentially have what may be an abscess in the muscle that has been noted in the central portion of the wound. This is the first time that I have noted what appears to be fluctuance in the central portion of the muscle. With that being said I'm somewhat more concerned about the fact that this might indicate an abscess formation at this location. I do believe that an ultrasound would be appropriate. This is likely something we need to try to do as soon as possible. He has been switch to mupirocin ointment and he is no longer using the steroid ointment as prescribed by dermatology he sees them again next week he's been decreased from 60 to 40 mg of prednisone. 03/09/18 on evaluation today patient actually appears to be doing a little better compared to last time I saw him. There's not as much erythema surrounding the wound itself. He I did review his most recent infectious disease note which was dated 02/24/18. He saw Dr. Michel Bickers in [place]. 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 Patient is now off of antibiotics and they are continually observing this. There seems to be no change in the past two weeks in my pinion based on what the patient says and what I see today compared to what Dr. Megan Salon likely saw two weeks ago. No fevers, chills, nausea, or vomiting noted at this time. 03/23/18 on evaluation today patient's wound actually appears to be showing signs of  improvement which is good news. He is currently still on the Dapsone. He is also working on tapering the prednisone to get off of this and Dr. Lottie Rater is working with him in this regard. Nonetheless overall I feel like the wound is doing well it does appear based on the infectious disease note that I reviewed from Dr. 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 I did also review the results of his ultrasound of left lower extremity which revealed there was a dentist tissue in the base of the wound without an abscess noted. 04/06/18 on evaluation today the patient's left lateral lower extremity ulcer actually appears to be doing fairly well which is excellent news. There does not appear to be any evidence of infection at this time which is also great news. Overall he still does have a significantly large ulceration although little by little he seems to be making progress. He is down to 10 mg a day of the prednisone. 04/20/18 on evaluation today patient actually appears to be doing excellent at this time in regard to his left lower extremity ulcer. He's making signs of good progress unfortunately this is taking much longer than we would really like to see but nonetheless he is making progress. Fortunately there does not appear to be any evidence of infection at this time. No fevers, chills, nausea, or vomiting noted at this time. The patient has not been using the [facility]  due to the cost he hadn't got in this field yet. He's mainly been using the antibiotic ointment topically. Subsequently he also tells me that he really has not been scrubbing in the shower I think this would be helpful again as I told him it doesn't have to be anything too aggressive to even make it believe just enough to keep it free of some of the loose slough and biofilm on the wound surface. 05/11/18 on evaluation today  patient's wound appears to be making slow but sure progress in regard to the left lateral lower extremity ulcer. He is been tolerating the dressing changes without complication. Fortunately there does not appear to be any evidence of infection at this time. He is still just using triple antibiotic ointment along with clobetasol occasionally over the area. He never got the Santyl and really does not seem to intend to in my pinion. 06/01/18 on evaluation today patient appears to be doing a little better in regard to his left lateral lower extremity ulcer. He states that overall he does not feel like he is doing as well with the Dapsone as he did with the prednisone. Nonetheless he sees his dermatologist later today and is gonna talk to them about the possibility of going back on the prednisone. Overall again I believe that the wound would be better if you would utilize Santyl but he really does not seem to be interested in going back to the [place] at this point. He has been using triple antibiotic ointment. 06/15/18 on evaluation today patient's wound actually appears to be doing about the same at this point. Fortunately there is no signs of infection at this time. He has made slight improvements although he continues to not really want to clean the wound bed at this point. He states that he just doesn't mess with it he doesn't want to cause any problems with everything else he has going on. He has been on medication, antibiotics as prescribed by his dermatologist, for a staff infection of his lower extremities which is really drying out now and looking much better he tells me. Fortunately there is no sign of overall infection. 06/29/18 on evaluation today patient appears to be doing well in regard to his left lateral lower surety ulcer all things considering. Fortunately his staff infection seems to be greatly improved compared to previous. He has no signs of infection and this is drying up quite nicely.  He is still the doxycycline for this is no longer on cental, Dapsone, or any of the other medications. His dermatologist has recommended possibility of an infusion but right now he does not want to proceed with that. 07/13/18 on evaluation today patient appears to be doing about the same in regard to his left lateral lower surety ulcer. Fortunately there's no signs of infection at this time which is great news. Unfortunately he still builds up a significant amount of Slough/biofilm of the surface of the wound he still is not really cleaning this as he should be appropriately. Again I'm able to easily with saline and gauze remove the majority of this on the surface which if you would do this at home would likely be a dramatic improvement for him as far as getting the area to improve. Nonetheless overall I still feel like he is making progress is just very slow. I think Santyl will be of benefit for him as well. Still he has not gotten this as of this point. 07/27/18 on evaluation today patient actually  appears to be doing little worse in regards of the erythema around the periwound region of the wound he also tells me that he's been having more drainage currently compared to what he was experiencing last time I saw him. He states not quite as bad as what he had because this was infected previously but nonetheless is still appears to be doing poorly. Fortunately there is no evidence of systemic infection at this point. The patient tells me that he is not going to be able to afford the Santyl. He is still waiting to hear about the infusion therapy with his dermatologist. Apparently she wants an updated colonoscopy first. BEAU, RAMSBURG ([id number]) 08/10/18 on evaluation today patient appears to be doing better in regard to his left lateral lower extremity ulcer. Fortunately he is showing signs of improvement in this regard he's actually been approved for Remicade infusion's as well although this has not been  scheduled as of yet. Fortunately there's no signs of active infection at this time in regard to the wound although he is having some issues with infection of the right lower extremity is been seen as dermatologist for this. Fortunately they are definitely still working with him trying to keep things under control. 09/07/18 on evaluation today patient is actually doing rather well in regard to his left lateral lower extremity ulcer. He notes these actually having some hair grow back on his extremity which is something he has not seen in years. He also tells me that the pain is really not giving them any trouble at this time which is also good news overall she is very pleased with the progress he's using a combination of the mupirocin along with the probate is all mixed. 09/21/18 on evaluation today patient actually appears to be doing fairly well all things considered in regard to his looks from the ulcer. He's been tolerating the dressing changes without complication. Fortunately there's no signs of active infection at this time which is good news he is still on all antibiotics or prevention of the staff infection. He has been on prednisone for time although he states it is gonna contact his dermatologist and see if she put them on a short course due to some irritation that he has going on currently. Fortunately there's no evidence of any overall worsening this is going very slow I think cental would be something that would be helpful for him although he states that $50 for tube is quite expensive. He therefore is not willing to get that at this point. 10/06/18 on evaluation today patient actually appears to be doing decently well in regard to his left lateral leg ulcer. He's been tolerating the dressing changes without complication. Fortunately there's no signs of active infection at this time. Overall I'm actually rather pleased with the progress he's making although it's slow he doesn't show any signs  of infection and he does seem to be making some improvement. I do believe that he may need a switch up and dressings to try to help this to heal more appropriately and quickly. 10/19/18 on evaluation today patient actually appears to be doing better in regard to his left lateral lower extremity ulcer. This is shown signs of having much less Slough buildup at this point due to the fact he has been using the [employer]. Obviously this is very good news. The overall size of the wound is not dramatically smaller but again the appearance is. 11/02/18 on evaluation today patient actually appears to be doing quite  well in regard to his lower [place] ulcer. A lot of the skin around the ulcer is actually somewhat irritating at this point this seems to be more due to the dressing causing irritation from the adhesive that anything else. Fortunately there is no signs of active infection at this time. 11/24/18 on evaluation today patient appears to be doing a little worse in regard to his overall appearance of his lower extremity ulcer. There's more erythema and warmth around the wound unfortunately. He is currently on doxycycline which he has been on for some time. With that being said I'm not sure that seems to be helping with what appears to possibly be an acute cellulitis with regard to his left lower extremity ulcer. No fevers, chills, nausea, or vomiting noted at this time. 12/08/18 on evaluation today patient's wounds actually appears to be doing significantly better compared to his last evaluation. He has been using Santyl along with alternating tripling about appointment as well as the steroid cream seems to be doing quite well and the wound is showing signs of improvement which is excellent news. Fortunately there's no evidence of infection and in fact his culture came back negative with only normal skin flora noted. 12/21/2018 upon evaluation today patient actually appears to be doing excellent with regard to his  ulcer. This is actually the best that I have seen it since have been helping to take care of him. It is both smaller as well as less slough noted on the surface of the wound and seems to be showing signs of good improvement with new skin growing from the edges. He has been using just the triamcinolone he does wonder if he can get a refill of that ointment today. 01/04/2019 upon evaluation today patient actually appears to be doing well with regard to his left lateral lower extremity ulcer. With that being said it does not appear to be that he is doing quite as well as last time as far as progression is concerned. There does not appear to be any signs of infection or significant irritation which is good news. With that being said I do believe that he may benefit from switching to a collagen based dressing based on how clean The wound appears. 01/18/2019 on evaluation today patient actually appears to be doing well with regard to his wound on the left lower extremity. He is not made a lot of progress compared to where we were previous but nonetheless does seem to be doing okay at this time which is good news. There is no signs of active infection which is also good news. My only concern currently is I do wish we can get him into utilizing the collagen dressing his insurance would not pay for the supplies that we ordered although it appears that he may be able to order this through his supply company that he typically utilizes. This is [employer]. Nonetheless he did try to order it during the office visit today and it appears this did go through. We will see if he can get that it is a different brand but nonetheless he has collagen and I do think will be beneficial. 02/01/2019 on evaluation today patient actually appears to be doing a little worse today in regard to the overall size of his wounds. Fortunately there is no signs of active infection at this time. That is visually. Nonetheless when this is  happened before it was due to infection. For that reason were somewhat concerned about that this time as  well. 02/08/2019 on evaluation today patient unfortunately appears to be doing slightly worse with regard to his wound upon evaluation today. Is measuring a little deeper and a little larger unfortunately. I am not really sure exactly what is causing this to enlarge he actually did see his dermatologist she is going to see about initiating Humira for him. Subsequently she also did do steroid injections into the wound itself in the periphery. Nonetheless still nonetheless he seems to be getting a little bit larger he is gone back to just using the steroid cream topically which I think is appropriate. I would say hold off on the collagen for the time being is definitely a good thing to do. Based on the culture results which we finally did get the final result back regarding it shows staph as the bacteria noted again that can be a normal skin bacteria based on the fact however he is having increased drainage and worsening of the wound measurement wise I would go ahead and place him on an antibiotic today I do believe for this. 02/15/2019 on evaluation today patient actually appears to be doing somewhat better in regard to his ulcer. There is no signs of worsening at this time I did review his culture results which showed evidence of Staphylococcus aureus but not MRSA. Again this could just be more related to the normal skin bacteria although he states the drainage has slowed down quite a bit he may have had a mild infection not just colonization. And was much smaller and then since around10/04/2019 on evaluation today patient appears to be doing unfortunately worse as far as the size of the wound. I really feel like that this is steadily getting larger again it had been doing excellent right at the beginning of September we have seen a steady increase in the area of the wound it is almost 2-1/2 times  the size it was on September 1. Obviously this is a bad trend this is not wanting to see. For that reason we went back to using just the topical triamcinolone cream which does seem to help with inflammation. I checked him for bacteria by way of culture and nothing showed positive there. I am considering giving him a short course of a tapering steroid Reza Crymes, Susie ([id number]) today to see if that is can be beneficial for him. The patient is in agreement with giving that a try. 03/08/2019 on evaluation today patient appears to be doing very well in comparison to last evaluation with regard to his lower extremity ulcer. This is showing signs of less inflammation and actually measuring slightly smaller compared to last time every other week over the past month and a half he has been measuring larger larger larger. Nonetheless I do believe that the issue has been inflammation the prednisone does seem to have been beneficial for him which is good news. No fevers, chills, nausea, vomiting, or diarrhea. 03/22/2019 on evaluation today patient appears to be doing about the same with regard to his leg ulcer. He has been tolerating the dressing changes without complication. With that being said the wound seems to be mostly arrested at its current size but really is not making any progress except for when we prescribed the prednisone. He did show some signs of dropping as far as the overall size of the wound during that interval week. Nonetheless this is something he is not on long-term at this point and unfortunately I think he is getting need either this or else  the Humira which his dermatologist has discussed try to get approval for. With that being said he will be seeing his dermatologist on the 11th of this month that is November. 04/19/2019 on evaluation today patient appears to be doing really about the same the wound is measuring slightly larger compared to last time I saw him. He has not been  into the office since November 2 due to the fact that he unfortunately had Covid as that his entire family. He tells me that it was rough but they did pull-through and he seems to be doing much better. Fortunately there is no signs of active infection at this time. No fevers, chills, nausea, vomiting, or diarrhea. 05/10/2019 on evaluation today patient unfortunately appears to be doing significantly worse as compared to last time I saw him. He does tell me that he has had his first dose of Humira and actually is scheduled to get the next one in the upcoming week. With that being said he tells me also that in the past several days he has been having a lot of issues with green drainage she showed me a picture this is more blue-green in color. He is also been having issues with increased sloughy buildup and the wound does appear to be larger today. Obviously this is not the direction that we want everything to take based on the starting of his Humira. Nonetheless I think this is definitely a result of likely infection and to be honest I think this is probably Pseudomonas causing the infection based on what I am seeing. 05/24/2019 on evaluation today patient unfortunately appears to be doing significantly worse compared to his prior evaluation with me 2 weeks ago. I did review his culture results which showed that he does have Staph aureus as well as Pseudomonas noted on the culture. Nonetheless the Levaquin that I prescribed for him does not appear to have been appropriate and in fact he tells me he is no longer experiencing the green drainage and discharge that he had at the last visit. Fortunately there is no signs of active infection at this time which is good news although the wound has significantly worsened it in fact is much deeper than it was previous. We have been utilizing up to this point triamcinolone ointment as the prescription topical of choice but at this time I really feel like that the  wound is getting need to be packed in order to appropriately manage this due to the deeper nature of the wound. Therefore something along the lines of an alginate dressing may be more appropriate. 05/31/2019 upon inspection today patient's wound actually showed signs of doing poorly at this point. Unfortunately he just does not seem to be making any good progress despite what we have tried. He actually did go ahead and pick up the Cipro and start taking that as he was noticing more green drainage he had previously completed the Levaquin that I prescribed for him as well. Nonetheless he missed his appointment for the seventh last week on Wednesday with the wound care center and [facility] where his dermatologist referred him. Obviously I do think a second opinion would be helpful at this point especially in light of the fact that the patient seems to be doing so poorly despite the fact that we have tried everything that I really know how at this point. The only thing that ever seems to have helped him in the past is when he was on high doses of  continual steroids that did seem to make a difference for him. 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 06/07/2019 upon evaluation today patient unfortunately appears to be doing worse yet again with regard to his wound. In fact I am starting to question whether or not he may have a fluid pocket in the muscle at this point based on the bulging and the soft appearance to the central portion of the muscle area. There is not anything draining from the muscle itself at this time which is good news but nonetheless the wound is expanding. I am not really seeing any results of the Humira as far as overall wound progression based on what I am seeing at this point. The patient has been referred for second  opinion with regard to his wound to the [facility] wound care center by his dermatologist which I definitely am not in opposition to. Unfortunately we tried multiple dressings in the past including collagen, alginate, and at one point even Hydrofera Blue. With that being said he is never really used it for any significant amount of time due to the fact that he often complains of pain associated with these dressings and then will go back to either using the Santyl which she has done intermittently or more frequently the triamcinolone. He is also using his own compression stockings. We have wrapped him in the past but again that was something else that he really was not a big fan of. 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 He seemed to do much better when he was on the steroids although honestly I understand that the steroids are not necessarily the best medication to be on long-term obviously 06/14/2019 on evaluation today patient appears to be doing actually a little bit better with regard to the overall appearance with his leg. Unfortunately he does continue to have issues with what appears to be some fluid underneath the muscle although he did see the wound specialty center at [facility] last week their main goals were to see about infusion therapy in place of the Humira as they feel like that is not quite strong enough. They also recommended that we continue with the treatment otherwise as we are they felt like that was appropriate and they are okay with him continuing to follow-up here with [place] in that regard. With that being said they are also sending him to the vein specialist there to  see about vein stripping and if that would be of benefit for him. Subsequently they also did not really address whether or not an ultrasound of the muscle area to see if there is anything that needs to be addressed here would be appropriate or not. For that reason I discussed this with him last week I think we may proceed down that road at this point. 06/21/2019 upon evaluation today patient's wound actually appears to be doing slightly better compared to previous evaluations. I do believe that he has made a difference  with regard to the progression here with the use of oral steroids. Again in the past has been the only thing that is really calm things down. He does tell me that from [facility] is gotten a good news from there that there are no further vein stripping that is necessary at this point. I do not have that available for review today although the patient did relay this to me. He also did obtain and have the ultrasound of the wound completed which I did sign off on today. It does appear that there is no fluid collection under the muscle this is likely then just edematous tissue in general. That is also good news. Overall I still believe the inflammation is the main issue here. He did inquire about the possibility of a wound VAC again with the muscle protruding like it is I am not really sure whether the wound VAC is necessarily ideal or not. That is something we will have to consider although I do believe he may need compression wrapping to try to help with edema control which could potentially be of benefit. 06/28/2019 on evaluation today patient appears to be doing slightly better measurement wise although this is not terribly smaller he least seems to be trending towards that direction. With that being said he still seems to have purulent drainage noted in the wound bed at this time. He has been on Levaquin followed by Cipro over the past month. Unfortunately he still seems to have some issues with  active infection at this time. I did perform a culture last week in order to evaluate and see if indeed there was still anything going on. Subsequently the culture did come back Sanz, Joon ([id number]) showing Pseudomonas which is consistent with the drainage has been having which is blue-green in color. He also has had an odor that again was somewhat consistent with Pseudomonas as well. Long story short it appears that the culture showed an intermediate finding with regard to how well the Cipro will work for the Pseudomonas infection. Subsequently being that he does not seem to be clearing up and at best what we are doing is just keeping this at [place] I think he may need to see infectious disease to discuss IV antibiotic options. 07/05/2019 upon evaluation today patient appears to be doing okay in regard to his leg ulcer. He has been tolerating the dressing changes at this point without complication. Fortunately there is no signs of active infection at this time which is good news. No fevers, chills, nausea, vomiting, or diarrhea. With that being said he does have an appointment with infectious disease tomorrow and his primary care on Wednesday. Again the reason for the infectious disease referral was due to the fact that he did not seem to be fully resolving with the use of oral antibiotics and therefore we were thinking that IV antibiotic therapy may be necessary secondary to the fact that there was an intermediate finding for how effective the Cipro may be. Nonetheless again he has been having a lot of purulent and even green drainage. Fortunately right now that seems to have calmed down over the past week with the reinitiation of the oral antibiotic. Nonetheless we will see what Dr. Megan Salon has to say. 07/12/2019 upon evaluation today patient appears to be doing about the same at this point in regard to his left lower extremity ulcer. Fortunately there is no signs of active infection at this time  which is good news I do believe the  Levaquin has been beneficial I did review Dr. Hale Bogus note and to be honest I agree that the patient's leg does appear to be doing better currently. What we found in the past as he does not seem to really completely resolve he will stop the antibiotic and then subsequently things will revert back to having issues with blue-green drainage, increased pain, and overall worsening in general. Obviously that is the reason I sent him back to infectious disease. 07/19/2019 upon evaluation today patient appears to be doing roughly the same in size there is really no dramatic improvement. He has started back on the Levaquin at this point and though he seems to be doing okay he did still have a lot of blue/green drainage noted on evaluation today unfortunately. I think that this is still indicative more likely of a Pseudomonas infection as previously noted and again he does see Dr. Megan Salon in just a couple of days. I do not know that were really able to effectively clear this with just oral antibiotics alone based on what I am seeing currently. Nonetheless we are still continue to try to manage as best we can with regard to the patient and his wound. I do think the wrap was helpful in decreasing the edema which is excellent news. No fevers, chills, nausea, vomiting, or diarrhea. 07/26/2019 upon evaluation today patient appears to be doing slightly better with regard to the overall appearance of the muscle there is no dark discoloration centrally. Fortunately there is no signs of active infection at this time. No fevers, chills, nausea, vomiting, or diarrhea. Patient's wound bed currently the patient did have an appointment with Dr. Megan Salon at [facility] last week. With that being said Dr. Megan Salon the patient states was still somewhat hesitant about put him on any IV antibiotics he wanted [place] to repeat cultures today and then see where things go going forward. He does  look like Dr. Megan Salon because of some improvement the patient did have with the Levaquin wanted [place] to see about repeating cultures. If it indeed grows the Pseudomonas again then he recommended a possibility of considering a PICC line placement and IV antibiotic therapy. He plans to see the patient back in 1 to 2 weeks. 08/02/2019 upon evaluation today patient appears to be doing poorly with regard to his left lower extremity. We did get the results of his culture back it shows that he is still showing evidence of Pseudomonas which is consistent with the purulent/blue-green drainage that he has currently. Subsequently the culture also shows that he now is showing resistance to the oral fluoroquinolones which is unfortunate as that was really the only thing to treat the infection prior. I do believe that he is looking like this is going require IV antibiotic therapy to get this under control. Fortunately there is no signs of systemic infection at this time which is good news. The patient does see Dr. Megan Salon tomorrow. 08/09/2019 upon evaluation today patient appears to be doing better with regard to his left lower extremity ulcer in regard to the overall appearance. He is currently on IV antibiotic therapy. As ordered by Dr. Megan Salon. Currently the patient is on ceftazidime which she is going to take for the next 2 weeks and then follow-up for 4 to 5-week appointment with Dr. Megan Salon. The patient started this this past Friday symptoms have not for a total of 3 days currently in full. 08/16/2019 upon evaluation today patient's wound actually does show muscle in the base of the  wound but in general does appear to be much better as far as the overall evidence of infection is concerned. In fact I feel like this is for the most part cleared up he still on the IV antibiotics he has not completed the full course yet but I think he is doing much better which is excellent news. 08/23/2019 upon evaluation today  patient appears to be doing about the same with regard to his wound at this point. He tells me that he still has pain unfortunately. Fortunately there is no evidence of systemic infection at this time which is great news. There is significant muscle protrusion. 09/13/19 upon evaluation today patient appears to be doing about the same in regard to his leg unfortunately. He still has a lot of drainage coming from the ulceration there is still muscle exposed. With that being said the patient's last wound culture still showed an intermediate finding with regard to the Pseudomonas he still having the bluish/green drainage as well. Overall I do not know that the wound has completely cleared of infection at this point. Fortunately there is no signs of active infection systemically at this point which is good news. 09/20/2019 upon evaluation today patient's wound actually appears to be doing about the same based on what I am seeing currently. I do not see any signs of systemic infection he still does have evidence of some local infection and drainage. He did see Dr. Megan Salon last week and Dr. Megan Salon states that he probably does need a different IV antibiotic although he does not want to put him on this until the patient begins the Remicade infusion which is actually scheduled for about 10 days out from today on 13 May. Following that time Dr. Megan Salon is good to see him back and then will evaluate the feasibility of starting him on the IV antibiotic therapy once again at that point. I do not disagree with this plan I do believe as Dr. Megan Salon stated in his note that I reviewed today that the patient's issue is multifactorial with the pyoderma being 1 aspect of this that were hoping the Remicade will be helpful for her. In the meantime I think the gentamicin is, helping to keep things under decent okay control in regard to the ulcer. 09/27/2019 upon evaluation today patient appears to be doing about the same  with regard to his wound still there is a lot of muscle exposure though he does have some hyper granulation tissue noted around the edge and actually some granulation tissue starting to form over the muscle which is actually good news. Fortunately there is no evidence of active infection which is also good news. His pain is less at this point. 5/21; this is a patient I have not seen in a long time. 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 He has a large wound on the left lateral leg with protruding muscle. He comes in the clinic today showing the same area on his left medial ankle. He says there is been a spot there for some time although we have not previously defined this. Today he has a clearly defined area with slight amount of skin breakdown surrounded by raised areas with a purplish hue in color. This is not painful he says it is irritated. 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 He still has muscle protruding from the lateral portion of his left  leg but fortunately the new area that they were concerned about at his last visit does not appear to have opened at this point. He is currently on Remicade infusions and seems to be doing better in my opinion in fact the wound itself seems to be overall much better. The purplish discoloration that he did have seems to have resolved and I think that is a good sign that hopefully the Remicade is doing its job. He does Gatton, Treyshawn ([id number]) have some biofilm noted over the surface of the wound. 11/01/2019 on evaluation today patient's wound actually appears to be doing excellent at this time. Fortunately there is no evidence of active infection and overall I feel like he is making great progress. The Remicade seems to be due excellent job in my opinion. 11/08/19 evaluation today vision actually appears to  be doing quite well with regard to his weight ulcer. He's been tolerating dressing changes without complication. Fortunately there is no evidence of infection. No fevers, chills, nausea, or vomiting noted at this time. Overall states that is having more itching than pain which is actually a good sign in my opinion. 12/13/2019 upon evaluation today patient appears to be doing well today with regard to his wound. He has been tolerating the dressing changes without complication. Fortunately there is no sign of active infection at this time. No fevers, chills, nausea, vomiting, or diarrhea. Overall I feel like the infusion therapy has been very beneficial for him. 01/06/2020 on evaluation today patient appears to be doing well with regard to his wound. This is measuring smaller and actually looks to be doing better. Fortunately there is no signs of active infection at this point. No fevers, chills, nausea, vomiting, or diarrhea. With that being said he does still have the blue-green drainage but this does not seem to be causing any significant issues currently. He has been using the gentamicin that does seem to be keeping things under decent control at this point. He goes later this morning for his next infusion therapy for the pyoderma which seems to also be very beneficial. 02/07/2020 on evaluation today patient appears to be doing about the same in regard to his wounds currently. Fortunately there is no signs of active infection systemically he does still have evidence of local infection still using gentamicin. He also is showing some signs of improvement albeit slowly I do feel like we are making some progress here. 02/21/2020 upon evaluation today patient appears to be making some signs of improvement the wound is measuring a little bit smaller which is great news and overall I am very pleased with where he stands currently. He is going to be having infusion therapy treatment on the 15th of this month.  Fortunately there is no signs of active infection at this time. 03/13/2020 I do believe patient's wound is actually showing some signs of improvement here which is great news. He has continue with the infusion therapy through rheumatology/dermatology at [facility]. That does seem to be beneficial. I still think he gets as much benefit from this as he did from the prednisone initially but nonetheless obviously this is less harsh on his body that the prednisone as far as they are concerned. 03/31/2020 on evaluation today patient's wound actually showing signs of some pretty good improvement in regard to the overall appearance of the wound bed. There is still muscle exposed though he does have some epithelial growth around the edges of the wound. Fortunately there is no signs of active infection  at this time. No fevers, chills, nausea, vomiting, or diarrhea. 04/24/2020 upon evaluation today patient appears to be doing about the same in regard to his leg ulcer. He has been tolerating the dressing changes without complication. Fortunately there is no signs of active infection at this time. No fevers, chills, nausea, vomiting, or diarrhea. With that being said he still has a lot of irritation from the bandaging around the edges of the wound. We did discuss today the possibility of a referral to plastic surgery. 05/22/2020 on evaluation today patient appears to be doing well with regard to his wounds all things considered. He has not been able to get the Chantix apparently there is a recall nurse that I was unaware of put out by [employer] involuntarily. Nonetheless for now I am and I have to do some research into what may be the best option for him to help with quitting in regard to smoking and we discussed that today. 06/26/2020 upon evaluation today patient appears to be doing well with regard to his wound from the standpoint of infection I do not see any signs of infection at this point. With that being said  unfortunately he is still continuing to have issues with muscle exposure and again he is not having a whole lot of new skin growth unfortunately. There does not appear to be any signs of active infection at this time. No fevers, chills, nausea, vomiting, or diarrhea. 07/10/2020 upon evaluation today patient appears to be doing a little bit more poorly currently compared to where he was previous. I am concerned currently about an active infection that may be getting worse especially in light of the increased size and tenderness of the wound bed. No fevers, chills, nausea, vomiting, or diarrhea. 07/24/2020 upon evaluation today patient appears to be doing poorly in regard to his leg ulcer. He has been tolerating the dressing changes without complication but unfortunately is having a lot of discomfort. 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 Subsequently I think he is going require possibly IV antibiotics to get this under control. I am very concerned about the severity of his infection and the amount of discomfort he is having. 07/31/2020 upon evaluation today patient appears to be doing about the same in regard to his leg wound. He did see Dr. Megan Salon and Dr. Megan Salon is actually going to start him on IV antibiotics. He goes for the PICC line tomorrow. With that being said there do not have that run for 2 weeks and then see how things are doing and depending on how he is progressing they may extend that a little longer. Nonetheless I am glad this is getting ready to be in place and definitely feel it may help the patient. In the meantime is been using mainly triamcinolone to the wound bed has an anti-inflammatory. 08/07/2020 on evaluation today patient appears to be doing well with regard to his wound compared even last week. In the interim he has gotten the PICC line placed and overall this seems to be doing excellent. There does  not appear to be any evidence of infection which is great news systemically although locally of course has had the infection this appears to be improving with the use of the antibiotics. 08/14/2020 upon evaluation today patient's wound actually showing signs of excellent improvement. Overall the irritation has significantly improved the drainage is back down to more of a normal level and his pain is really pretty much  nonexistent compared to what it was. Obviously I think that this is significantly improved secondary to the IV antibiotic therapy which has made all the difference in the world. Again he had a resistant form of Pseudomonas for which oral antibiotics just was not cutting it. Nonetheless I do think that still we need to consider the possibility of a surgical closure for this wound is been open so long and to be honest with muscle exposed I think this can be very hard to get this to close outside of this although definitely were still working to try to do what we can in that regard. 08/21/2020 upon evaluation today patient appears to be doing very well with regard to his wounds on the left lateral lower extremity/calf area. Fortunately there does not appear to be signs of active infection which is great news and overall very pleased with where things stand today. He is actually wrapping up his treatment with IV antibiotics tomorrow. After that we will see where things go from there. 08/28/2020 upon evaluation today patient appears to be doing decently well with regard to his leg ulcer. There does not appear to be any signs of Basques, Shalom ([id number]) active infection which is great news and overall very pleased with where things stand today. No fevers, chills, nausea, vomiting, or diarrhea. 09/18/2020 upon evaluation today patient appears to be doing well with regard to his infection which I feel like is better. Unfortunately he is not doing as well with regard to the overall size of the wound  which is not nearly as good at this point. I feel like that he may be having an issue here with the pyoderma being somewhat out of control. I think that he may benefit from potentially going back and talking to the dermatologist about what to do from the pyoderma standpoint. I am not certain if the infusions are helping nearly as much is what the prednisone did in the past. 10/02/2020 upon evaluation today patient appears to be doing well with regard to his leg ulcer. He did go to the Psychiatric nurse. Unfortunately they feel like there is a 10% chance that most that he would be able to heal and that the skin graft would take. Obviously this has led him to not be able to go down that path as far as treatment is concerned. Nonetheless he does seem to be doing a little bit better with the prednisone that I gave him last time. I think that he may need to discuss with dermatology the possibility of long-term prednisone as that seems to be what is most helpful for him to be perfectly honest. I am not sure the Remicade is really doing the job. 10/17/2020 upon evaluation today patient appears to be doing a little better in regard to his wound. In fact the case has been since we did the prednisone on May 2 for him that we have noticed a little bit of improvement each time we have seen a size wise as well as appearance wise as well as pain wise. I think the prednisone has had a greater effect then the infusion therapy has to be perfectly honest. With that being said the patient also feels significantly better compared to what he was previous. All of this is good news but nonetheless I am still concerned about the fact that again we are really not set up to long-term manage him as far as prednisone is concerned. Obviously there are things that you need to be  watched I completely understand the risk of prednisone usage as well. 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  With all that being said I do believe that we can give him another round of the prednisone which she is requesting today because of the improvement that he seen since we did that first round. 10/30/2020 upon evaluation today patient's wound actually is showing signs of doing quite well. There does not appear to be any evidence of infection which is great news and overall very pleased with where things stand today. No fevers, chills, nausea, vomiting, or diarrhea. He tells me that the prednisone still has seem to have helped he wonders if we can extend that for just a little bit longer. He did not have the appointment with a dermatologist although he did have an infusion appointment last Friday. That was at [facility]. With that being said he tells me he could not do both that as well as the appointment with the physician on the same day therefore that is can have to be rescheduled. I really want to see if there is anything they feel like that could be done differently to try to help this out as I am not really certain that the infusions are helping significantly here. 11/13/2020 upon evaluation today patient unfortunately appears to be doing somewhat poorly in regard to his wound I feel like this is actually worsening from the standpoint of the pyoderma spreading. I still feel like that he may need something different as far as trying to manage this going forward. Again we did the prednisone unfortunately his blood sugars are not doing so well because of this. Nonetheless I believe that the patient likely needs to try topical steroid. We have done triamcinolone for a while I think going with something stronger such as clobetasol could be beneficial again this is not something I do lightly I discussed this with the patient that again this does not normally put underneath an occlusive dressing. Nonetheless I think a thin film as such could help with some of the stronger anti-inflammatory effects. We discussed  this today. He would like to try to give this a trial for the next couple weeks. I definitely think that is something that we can do. Evaluate7/03/2021 and today patient's wound bed actually showed signs of doing really about the same. There was a little expansion of the size of the wound and that leading edge that we done looking out although the clobetasol does seem to have slowed this down a bit in my opinion. There is just 1 small area that still seems to be progressing based on what I see. Nonetheless I am concerned about the fact this does not seem to be improving if anything seems to be doing a little bit worse. I do not know that the infusions are really helping him much as next infusion is August 5 his appointment with dermatology is July 25. Either way I really think that we need to have a conversation potentially about this and I am actually going to see if I can talk with Dr. Lillia Carmel in order to see where things stand as well. 12/11/2020 upon evaluation today patient appears to be doing worse in regard to his leg ulcer. Unfortunately I just do not think this is making the progress that I would like to see at this point. Honestly he does have an appointment with dermatology and this is in 2 days. I  am wondering what they may have to offer to help with this. Right now what I am seeing is that he is continuing to show signs of worsening little by little. Obviously that is not great at all. Is the exact opposite of what we are looking for. 12/18/2020 upon evaluation today patient appears to be doing a little better in regard to his wound. The dermatologist actually did do some steroid injections into the wound which does seem to have been beneficial in my opinion. That was on the 25th already this looks a little better to me than last time I saw him. With that being said we did do a culture and this did show that he has Staph aureus noted in abundance in the wound. With that being said I do  think that getting him on an oral antibiotic would be appropriate as well. Also think we can compression wrap and this will make a difference as well. 12/28/2020 upon evaluation today patient's wound is actually showing signs of doing much better. I do believe the compression wrap is helping he has a lot of drainage but to be honest I think that the compression is helping to some degree in this regard as well as not draining through which is also good news. No fevers, chills, nausea, vomiting, or diarrhea. 01/04/2021 upon evaluation today patient appears to be doing well with regard to his wound. Overall things seem to be doing quite well. He did have a little bit of reaction to the CarboFlex Sorbact he will be using that any longer. With that being said he is controlled as far as the drainage is concerned overall and seems to be doing quite well. I do not see any signs of active infection at this time which is great news. No fevers, chills, nausea, vomiting, or diarrhea. 01/11/2021 upon evaluation today patient appears to be doing well with regard to his wounds. He has been tolerating the dressing changes without complication. Fortunately there does not appear to be any signs of active infection at this time which is great news. Overall I am extremely pleased with where we stand currently. No fevers, chills, nausea, vomiting, or diarrhea. Where using clobetasol in the wound bed he has a lot of new skin growth which is awesome as well. MCIHAEL, HINDERMAN ([id number]) Objective Constitutional Obese and well-hydrated in no acute distress. Vitals Time Taken: 8:16 AM, Height: 71 in, Weight: 338 lbs, BMI: 47.1, Temperature: 98.8 F, Pulse: 120 bpm, Respiratory Rate: 18 breaths/min, Blood Pressure: 117/77 mmHg. Respiratory normal breathing without difficulty. Psychiatric this patient is able to make decisions and demonstrates good insight into disease process. Alert and Oriented x 3. pleasant and  cooperative. General Notes: Upon inspection patient's wound bed actually showed signs of good granulation epithelization at this point. Fortunately there does not appear to be any signs of active infection which is great and overall I am extremely pleased. I do believe that the Levaquin has done a great job getting the Pseudomonas cleared out which is made a big difference in getting him moving in towards a healing state. Integumentary (Hair, Skin) Wound #1 status is Open. Original cause of wound was Gradually Appeared. The date acquired was: 11/18/2015. The wound has been in treatment 214 weeks. The wound is located on the Left,Lateral Lower Leg. The wound measures 6.4cm length x 6.5cm width x 1cm depth; 32.673cm^2 area and 32.673cm^3 volume. There is muscle and Fat Layer (Subcutaneous Tissue) exposed. There is no tunneling noted, however, there is  undermining starting at 6:00 and ending at 9:00 with a maximum distance of 1cm. There is a large amount of purulent drainage noted. There is large (67-100%) red granulation within the wound bed. There is a small (1-33%) amount of necrotic tissue within the wound bed including Adherent Slough. 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 There was a Four Layer Compression Therapy Procedure with a pre-treatment ABI of 1.2 by Dolan Amen, RN. Post procedure Diagnosis Wound #1: Same as Pre-Procedure Plan Follow-up Appointments: Return Appointment in 1 week. - Next Thursday Nurse Visit as needed - Monday Bathing/ Shower/ Hygiene: Clean wound with Normal  Saline or wound cleanser. Edema Control - Lymphedema / Segmental Compressive Device / Other: Optional: One layer of unna paste to top of compression wrap (to act as an anchor). 4 Layer Compression System Lymphedema. Medications-Please add to medication list.: P.O. Antibiotics BERDELL, HOSTETLER ([id number]) The following medication(s) was prescribed: clobetasol topical 0.05 % ointment ointment topical apply a thin film to the wound bed then cover with a dressing as directed in the clinic 2 times per week starting 01/11/2021 WOUND #1: - Lower Leg Wound Laterality: Left, Lateral Cleanser: Soap and Water 2 x Per Week/30 Days Discharge Instructions: Gently cleanse wound with antibacterial soap, rinse and pat dry prior to dressing wounds Peri-Wound Care: Desitin Maximum Strength Ointment 4 (oz) 2 x Per Week/30 Days Topical: Clobetasol Propionate ointment 0.05%, 60 (g) tube 2 x Per Week/30 Days Discharge Instructions: Apply in undermining area and reddened area on posterior leg Primary Dressing: Calcium Alginate Dressing, 6x6 (in/in) 2 x Per Week/30 Days Discharge Instructions: Apply to wound bed Secondary Dressing: Xtrasorb Large 6x9 (in/in) 2 x Per Week/30 Days Discharge Instructions: Apply to wound as directed. Do not cut. Secondary Dressing: Zetuvit Absorbent Pad, 4x8 (in/in) 2 x Per Week/30 Days Discharge Instructions: Apply on posterior leg to collect drainage Compression Wrap: Medichoice 4 layer Compression System, 35-40 mmHG (Generic) 2 x Per Week/30 Days Discharge Instructions: Apply multi-layer wrap as directed. Add-Ons: CarboFLEX Odor Control Dressing, 4x4 (in/in) 2 x Per Week/30 Days Discharge Instructions: PLEASE APPLY OVER THE XSORB DO NOT APPLY ON SKIN Place the fibrous (non-shiny) surface of the dressing directly onto the wound for management of malodorous wounds. 1. Would recommend currently that we going continue with the clobetasol topically. 2. I am also can recommend that we go  ahead and continue with the absorptive dressings over top including the alginate directly over the wound I think he has a lot of new skin growth which is awesome and in general he is making excellent progress. 3. I will send in a refill of the clobetasol which I think is still something necessary at this point. We will see patient back for reevaluation in 1 week here in the clinic. If anything worsens or changes patient will contact our office for additional recommendations. Electronic Signature(s) Signed: 01/11/2021 9:02:03 AM By: Worthy Keeler PA-C Previous Signature: 01/11/2021 8:48:11 AM Version By: Worthy Keeler PA-C Entered By: Worthy Keeler on 01/11/2021 09:02:03 AARUSH, STUKEY ([id number]) -------------------------------------------------------------------------------- SuperBill Details Patient Name: [name] Date of Service: 01/11/2021 Medical Record Number: [id number] Patient Account Number: [id number] Date of Birth/Sex: Aug 01, 1980 (39  y.o. 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 I87.2 Venous insufficiency (chronic) (peripheral) L03.116 Cellulitis of left lower limb F17.208 Nicotine dependence, unspecified, with other nicotine-induced disorders Facility Procedures CPT4 Code: 23953202 Description: (Facility Use Only) [phone] - [place] COMPRS LWR LT LEG Modifier: Quantity: 1 Physician Procedures CPT4 Code: 6168372 Description: 90211 - WC PHYS LEVEL 4 - EST PT Modifier: Quantity: 1 CPT4 Code: Description: ICD-10 Diagnosis Description L97.222 Non-pressure chronic ulcer of left calf with fat layer  exposed 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

## 2021-01-15 ENCOUNTER — Other Ambulatory Visit: Payer: Self-pay

## 2021-01-15 DIAGNOSIS — L97222 Non-pressure chronic ulcer of left calf with fat layer exposed: Secondary | ICD-10-CM | POA: Diagnosis not present

## 2021-01-15 NOTE — Progress Notes (Signed)
[name], [name] ([id number]) Visit Report for 01/15/2021 Arrival Information Details Patient Name: [name], [name] Date of Service: 01/15/2021 8:15 AM Medical Record Number: [id number] Patient Account Number: [id number] Date of Birth/Sex: 02-Oct-1980 (40 y.o. M) Treating RN: Carlene Coria Primary Care Aydden Cumpian: [name] Other Clinician: Referring Fotios Amos: [name] Treating Adasia Hoar/Extender: [name] Cliche in Treatment: 54 Visit Information History Since Last Visit All ordered tests and consults were completed: No Patient Arrived: Ambulatory Added or deleted any medications: No Arrival Time: 08:32 Any new allergies or adverse reactions: No Accompanied By: self Had a fall or experienced change in No Transfer Assistance: None activities of daily living that may affect Patient Identification Verified: Yes risk of falls: Secondary Verification Process Completed: Yes Signs or symptoms of abuse/neglect since last visito No Patient Requires Transmission-Based No Hospitalized since last visit: No Precautions: Implantable device outside of the clinic excluding No Patient Has Alerts: Yes cellular tissue based products placed in the center Patient Alerts: Patient has reaction since last visit: to Has Dressing in Place as Prescribed: Yes silver dressings. Has Compression in Place as Prescribed: Yes Pain Present Now: No Electronic Signature(s) Signed: 01/15/2021 8:33:23 AM By: Carlene Coria RN Entered By: Carlene Coria on 01/15/2021 08:33:23 [name] ([id number]) -------------------------------------------------------------------------------- Clinic Level of Care Assessment Details Patient Name: [name] Date of Service: 01/15/2021 8:15 AM Medical Record Number: [id number] Patient Account Number: [id number] Date of Birth/Sex: [birth year] (40 y.o. M) Treating RN: Carlene Coria Primary Care Lianni Kanaan: [name] Other Clinician: Referring Jaelani Posa: [name] Treating Eulamae Greenstein/Extender: [name] Cliche in Treatment: 214 Clinic Level of Care Assessment Items TOOL 1 Quantity Score []  - Use when EandM and Procedure is performed on INITIAL visit 0 ASSESSMENTS - Nursing Assessment / Reassessment []  - General Physical Exam (combine w/ comprehensive assessment (listed just below) when performed on new 0 pt. evals) []  - 0 Comprehensive Assessment (HX, ROS, Risk Assessments, Wounds Hx, etc.) ASSESSMENTS - Wound and Skin Assessment / Reassessment []  - Dermatologic / Skin Assessment (not related to wound area) 0 ASSESSMENTS - Ostomy and/or Continence Assessment and Care []  - Incontinence Assessment and Management 0 []  - 0 Ostomy Care Assessment and Management (repouching, etc.) PROCESS - Coordination of Care []  - Simple Patient / Family Education for ongoing care 0 []  - 0 Complex (extensive) Patient / Family Education for ongoing care []  - 0 Staff obtains Programmer, systems, Records, Test Results / Process Orders []  - 0 Staff telephones HHA, Nursing Homes / Clarify orders / etc []  - 0 Routine Transfer to another Facility (non-emergent condition) []  - 0 Routine Hospital Admission (non-emergent condition) []  - 0 New Admissions / Biomedical engineer / Ordering NPWT, Apligraf, etc. []  - 0 Emergency Hospital Admission (emergent condition) PROCESS - Special Needs []  - Pediatric / Minor Patient Management 0 []  - 0 Isolation Patient Management []  - 0 Hearing / Language / Visual special needs []  - 0 Assessment of Community assistance (transportation, D/C planning, etc.) []  - 0 Additional assistance / Altered mentation []  - 0 Support Surface(s) Assessment (bed, cushion, seat, etc.) INTERVENTIONS - Miscellaneous []  - External ear exam 0 []  - 0 Patient Transfer (multiple staff / Civil Service fast streamer / Similar devices) []  - 0 Simple Staple / Suture removal (25 or less) []  - 0 Complex Staple / Suture removal (26 or more) []  -  0 Hypo/Hyperglycemic Management (do not check if billed separately) []  - 0 Ankle / Brachial Index (ABI) - do not check if billed separately Has the patient been seen at the  hospital within the last three years: Yes Total Score: 0 Level Of Care: ____ [name] ([id number]) Electronic Signature(s) Unsigned Entered By: Carlene Coria on 01/15/2021 08:34:44 Signature(s): Date(s): [name] ([id number]) -------------------------------------------------------------------------------- Compression Therapy Details Patient Name: [name], [name] Date of Service: 01/15/2021 8:15 AM Medical Record Number: [id number] Patient Account Number: [id number] Date of Birth/Sex: 1980/10/01 (40 y.o. M) Treating RN: Carlene Coria Primary Care Pamula Luther: [name] Other Clinician: Referring Nichols Corter: [name] Treating Ariaunna Longsworth/Extender: [name] Cliche in Treatment: 214 Compression Therapy Performed for Wound Assessment: Wound #1 Left,Lateral Lower Leg Performed By: Clinician Carlene Coria, RN Compression Type: Four Layer Electronic Signature(s) Signed: 01/15/2021 8:34:08 AM By: Carlene Coria RN Entered By: Carlene Coria on 01/15/2021 08:34:08 [name] ([id number]) -------------------------------------------------------------------------------- Encounter Discharge Information Details Patient Name: [name] Date of Service: 01/15/2021 8:15 AM Medical Record Number: [id number] Patient Account Number: [id number] Date of Birth/Sex: 11/09/1980 (40 y.o. M) Treating RN: Carlene Coria Primary Care Shineka Auble: [name] Other Clinician: Referring Linsey Hirota: [name] Treating Aleese Kamps/Extender: [name] Cliche in Treatment: 214 Encounter Discharge Information Items Discharge Condition: Stable Ambulatory Status: Ambulatory Discharge Destination: Home Transportation: Private Auto Accompanied By: self Schedule Follow-up Appointment: Yes Clinical Summary of Care: Patient  Declined Electronic Signature(s) Signed: 01/15/2021 8:34:38 AM By: Carlene Coria RN Entered By: Carlene Coria on 01/15/2021 08:34:38 [name] ([id number]) -------------------------------------------------------------------------------- Wound Assessment Details Patient Name: [name] Date of Service: 01/15/2021 8:15 AM Medical Record Number: [id number] Patient Account Number: [id number] Date of Birth/Sex: May 07, 1981 (39 y.o. M) Treating RN: Carlene Coria Primary Care Afsheen Antony: [name] Other Clinician: Referring Ferron Ishmael: [name] Treating Hanan Mcwilliams/Extender: [name] Cliche in Treatment: 214 Wound Status Wound Number: 1 Primary Etiology: Pyoderma Wound Location: Left, Lateral Lower Leg Wound Status: Open Wounding Event: Gradually Appeared Comorbid History: Sleep Apnea, Hypertension, Colitis Date Acquired: 11/18/2015 Weeks Of Treatment: 214 Clustered Wound: No Wound Measurements Length: (cm) 6.4 Width: (cm) 6.5 Depth: (cm) 1 Area: (cm) 32.673 Volume: (cm) 32.673 % Reduction in Area: -565.6% % Reduction in Volume: -732% Epithelialization: None Tunneling: No Undermining: No Wound Description Classification: Full Thickness With Exposed Support Structures Exudate Amount: Large Exudate Type: Purulent Exudate Color: yellow, brown, green Foul Odor After Cleansing: No Slough/Fibrino Yes Wound Bed Granulation Amount: Large (67-100%) Exposed Structure Granulation Quality: Red Fascia Exposed: No Necrotic Amount: Small (1-33%) Fat Layer (Subcutaneous Tissue) Exposed: Yes Necrotic Quality: Adherent Slough Tendon Exposed: No Muscle Exposed: Yes Necrosis of Muscle: No Joint Exposed: No Bone Exposed: No Treatment Notes Wound #1 (Lower Leg) Wound Laterality: Left, Lateral Cleanser Soap and Water Discharge Instruction: Gently cleanse wound with antibacterial soap, rinse and pat dry prior to dressing wounds Peri-Wound Care Desitin Maximum Strength Ointment  4 (oz) Topical Clobetasol Propionate ointment 0.05%, 60 (g) tube Discharge Instruction: Apply in undermining area and reddened area on posterior leg Primary Dressing Calcium Alginate Dressing, 6x6 (in/in) Discharge Instruction: Apply to wound bed Secondary Dressing Xtrasorb Large 6x9 (in/in) Discharge Instruction: Apply to wound as directed. Do not cut. Zetuvit Absorbent Pad, 4x8 (in/in) Tennison, Samay ([id number]) Discharge Instruction: Apply on posterior leg to collect drainage Secured With Compression Wrap Medichoice 4 layer Compression System, 35-40 mmHG Discharge Instruction: Apply multi-layer wrap as directed. Compression Stockings Add-Ons CarboFLEX Odor Control Dressing, 4x4 (in/in) Discharge Instruction: PLEASE APPLY OVER THE XSORB DO NOT APPLY ON SKIN Place the fibrous (non-shiny) surface of the dressing directly onto the wound for management of malodorous wounds. Electronic Signature(s) Signed: 01/15/2021 8:33:46 AM By: Carlene Coria RN Entered By: Carlene Coria on 01/15/2021 08:33:46

## 2021-01-17 ENCOUNTER — Other Ambulatory Visit: Payer: Self-pay

## 2021-01-17 ENCOUNTER — Ambulatory Visit
Admission: RE | Admit: 2021-01-17 | Discharge: 2021-01-17 | Disposition: A | Discharge status: Home / Self Care | Payer: 59 | Source: Ambulatory Visit | Attending: Primary Care | Admitting: Primary Care

## 2021-01-17 DIAGNOSIS — R7989 Other specified abnormal findings of blood chemistry: Secondary | ICD-10-CM | POA: Insufficient documentation

## 2021-01-18 ENCOUNTER — Encounter: Payer: 59 | Attending: Physician Assistant | Admitting: Physician Assistant

## 2021-01-18 DIAGNOSIS — G4730 Sleep apnea, unspecified: Secondary | ICD-10-CM | POA: Diagnosis not present

## 2021-01-18 DIAGNOSIS — I10 Essential (primary) hypertension: Secondary | ICD-10-CM | POA: Diagnosis not present

## 2021-01-18 DIAGNOSIS — L080 Pyoderma: Secondary | ICD-10-CM | POA: Insufficient documentation

## 2021-01-18 NOTE — Progress Notes (Signed)
[name], [name] ([id number]) Visit Report for 01/18/2021 Arrival Information Details Patient Name: [name], [name] Date of Service: 01/18/2021 8:15 AM Medical Record Number: [id number] Patient Account Number: [id number] Date of Birth/Sex: 1980-07-22 (40 y.o. M) Treating RN: Dolan Amen Primary Care Julian Medina: [name] Other Clinician: Referring Niasha Devins: [name] Treating Kenedy Haisley/Extender: [name] Cliche in Treatment: 215 Visit Information History Since Last Visit Pain Present Now: No Patient Arrived: Ambulatory Arrival Time: 08:27 Accompanied By: self Transfer Assistance: None Patient Identification Verified: Yes Secondary Verification Process Completed: Yes Patient Requires Transmission-Based No Precautions: Patient Has Alerts: Yes Patient Alerts: Patient has reaction to silver dressings. Electronic Signature(s) Signed: 01/18/2021 4:48:21 PM By: Dolan Amen RN Entered By: Dolan Amen on 01/18/2021 08:27:15 [name] ([id number]) -------------------------------------------------------------------------------- Clinic Level of Care Assessment Details Patient Name: [name] Date of Service: 01/18/2021 8:15 AM Medical Record Number: [id number] Patient Account Number: [id number] Date of Birth/Sex: 04-19-1981 (40 y.o. M) Treating RN: Dolan Amen Primary Care Brytney Somes: [name] Other Clinician: Referring Alena Blankenbeckler: [name] Treating Heber Hoog/Extender: [name] Cliche in Treatment: 215 Clinic Level of Care Assessment Items TOOL 1 Quantity Score []  - Use when EandM and Procedure is performed on INITIAL visit 0 ASSESSMENTS - Nursing Assessment / Reassessment []  - General Physical Exam (combine w/ comprehensive assessment (listed just below) when performed on new 0 pt. evals) []  - 0 Comprehensive Assessment (HX, ROS, Risk Assessments, Wounds Hx, etc.) ASSESSMENTS - Wound and Skin Assessment / Reassessment []  - Dermatologic / Skin  Assessment (not related to wound area) 0 ASSESSMENTS - Ostomy and/or Continence Assessment and Care []  - Incontinence Assessment and Management 0 []  - 0 Ostomy Care Assessment and Management (repouching, etc.) PROCESS - Coordination of Care []  - Simple Patient / Family Education for ongoing care 0 []  - 0 Complex (extensive) Patient / Family Education for ongoing care []  - 0 Staff obtains Programmer, systems, Records, Test Results / Process Orders []  - 0 Staff telephones HHA, Nursing Homes / Clarify orders / etc []  - 0 Routine Transfer to another Facility (non-emergent condition) []  - 0 Routine Hospital Admission (non-emergent condition) []  - 0 New Admissions / Biomedical engineer / Ordering NPWT, Apligraf, etc. []  - 0 Emergency Hospital Admission (emergent condition) PROCESS - Special Needs []  - Pediatric / Minor Patient Management 0 []  - 0 Isolation Patient Management []  - 0 Hearing / Language / Visual special needs []  - 0 Assessment of Community assistance (transportation, D/C planning, etc.) []  - 0 Additional assistance / Altered mentation []  - 0 Support Surface(s) Assessment (bed, cushion, seat, etc.) INTERVENTIONS - Miscellaneous []  - External ear exam 0 []  - 0 Patient Transfer (multiple staff / Civil Service fast streamer / Similar devices) []  - 0 Simple Staple / Suture removal (25 or less) []  - 0 Complex Staple / Suture removal (26 or more) []  - 0 Hypo/Hyperglycemic Management (do not check if billed separately) []  - 0 Ankle / Brachial Index (ABI) - do not check if billed separately Has the patient been seen at the hospital within the last three years: Yes Total Score: 0 Level Of Care: ____ [name] ([id number]) Electronic Signature(s) Signed: 01/18/2021 4:48:21 PM By: Dolan Amen RN Entered By: Dolan Amen on 01/18/2021 09:04:49 [name] ([id number]) -------------------------------------------------------------------------------- Compression Therapy  Details Patient Name: [name] Date of Service: 01/18/2021 8:15 AM Medical Record Number: [id number] Patient Account Number: [id number] Date of Birth/Sex: 08-09-1980 (39 y.o. M) Treating RN: Dolan Amen Primary Care Everli Rother: [name] Other Clinician: Referring Peggyann Zwiefelhofer: [name] Treating  Sunset Joshi/Extender: Jeri Cos Weeks in Treatment: 215 Compression Therapy Performed for Wound Assessment: Wound #1 Left,Lateral Lower Leg Performed By: Clinician Dolan Amen, RN Compression Type: Four Layer Pre Treatment ABI: 1.2 Post Procedure Diagnosis Same as Pre-procedure Electronic Signature(s) Signed: 01/18/2021 4:48:21 PM By: Dolan Amen RN Entered By: Dolan Amen on 01/18/2021 09:02:48 [name] ([id number]) -------------------------------------------------------------------------------- Lower Extremity Assessment Details Patient Name: [name] Date of Service: 01/18/2021 8:15 AM Medical Record Number: [id number] Patient Account Number: [id number] Date of Birth/Sex: Oct 16, 1980 (39 y.o. M) Treating RN: Dolan Amen Primary Care Jusitn Salsgiver: [name] Other Clinician: Referring Buford Gayler: [name] Treating Lamiah Marmol/Extender: [name] Cliche in Treatment: 215 Edema Assessment Assessed: [Left: Yes] [Right: No] Edema: [Left: Ye] [Right: s] Calf Left: Right: Point of Measurement: 33 cm From Medial Instep 47.5 cm Ankle Left: Right: Point of Measurement: 12 cm From Medial Instep 28.5 cm Vascular Assessment Pulses: Dorsalis Pedis Palpable: [Left:Yes] Electronic Signature(s) Signed: 01/18/2021 4:48:21 PM By: Dolan Amen RN Entered By: Dolan Amen on 01/18/2021 08:40:29 [name] ([id number]) -------------------------------------------------------------------------------- Multi Wound Chart Details Patient Name: [name] Date of Service: 01/18/2021 8:15 AM Medical Record Number: [id number] Patient Account Number:  [id number] Date of Birth/Sex: Jul 25, 1980 (39 y.o. M) Treating RN: Dolan Amen Primary Care Deegan Valentino: [name] Other Clinician: Referring Ariatna Jester: [name] Treating Lezly Rumpf/Extender: [name] Cliche in Treatment: 215 Vital Signs Height(in): 71 Pulse(bpm): 83 Weight(lbs): 338 Blood Pressure(mmHg): 129/81 Body Mass Index(BMI): 47 Temperature(F): 98.8 Respiratory Rate(breaths/min): 18 Photos: [N/A:N/A] Wound Location: Left, Lateral Lower Leg N/A N/A Wounding Event: Gradually Appeared N/A N/A Primary Etiology: Pyoderma N/A N/A Comorbid History: Sleep Apnea, Hypertension, Colitis N/A N/A Date Acquired: 11/18/2015 N/A N/A Weeks of Treatment: 215 N/A N/A Wound Status: Open N/A N/A Measurements L x W x D (cm) 6x6x0.7 N/A N/A Area (cm) : 28.274 N/A N/A Volume (cm) : 19.792 N/A N/A % Reduction in Area: -476.00% N/A N/A % Reduction in Volume: -404.00% N/A N/A Starting Position 1 (o'clock): 6 Ending Position 1 (o'clock): 8 Maximum Distance 1 (cm): 0.7 Undermining: Yes N/A N/A Classification: Full Thickness With Exposed N/A N/A Support Structures Exudate Amount: Large N/A N/A Exudate Type: Purulent N/A N/A Exudate Color: yellow, brown, green N/A N/A Granulation Amount: Large (67-100%) N/A N/A Granulation Quality: Red, Hyper-granulation N/A N/A Necrotic Amount: Small (1-33%) N/A N/A Exposed Structures: Fat Layer (Subcutaneous Tissue): N/A N/A Yes Muscle: Yes Fascia: No Tendon: No Joint: No Bone: No Epithelialization: None N/A N/A Treatment Notes Electronic Signature(s) Signed: 01/18/2021 4:48:21 PM By: Dolan Amen RN Entered By: Dolan Amen on 01/18/2021 09:02:28 [name], [name] ([id number]) [name], [name] ([id number]) -------------------------------------------------------------------------------- Multi-Disciplinary Care Plan Details Patient Name: [name] Date of Service: 01/18/2021 8:15 AM Medical Record Number: [id number] Patient Account  Number: [id number] Date of Birth/Sex: 1980-08-18 (40 y.o. M) Treating RN: Dolan Amen Primary Care Aldonia Keeven: [name] Other Clinician: Referring Laraya Pestka: [name] Treating Tyreon Frigon/Extender: [name] Cliche in Treatment: 215 oo Multidisciplinary Care Plan reviewed with physician Active Inactive Electronic Signature(s) Signed: 01/18/2021 4:48:21 PM By: Dolan Amen RN Entered By: Dolan Amen on 01/18/2021 09:02:14 [name], [name] ([id number]) -------------------------------------------------------------------------------- Pain Assessment Details Patient Name: [name] Date of Service: 01/18/2021 8:15 AM Medical Record Number: [id number] Patient Account Number: [id number] Date of Birth/Sex: [birth year] (39 y.o. M) Treating RN: Dolan Amen Primary Care Lavonia Eager: [name] Other Clinician: Referring Burnett Lieber: [name] Treating Garrick Midgley/Extender: [name] Cliche in Treatment: 215 Active Problems Location of Pain Severity and Description of Pain Patient Has Paino No Site  Locations Rate the pain. Current Pain Level: 0 Pain Management and Medication Current Pain Management: Electronic Signature(s) Signed: 01/18/2021 4:48:21 PM By: Dolan Amen RN Entered By: Dolan Amen on 01/18/2021 08:28:17 [name], [name] ([id number]) -------------------------------------------------------------------------------- Wound Assessment Details Patient Name: [name] Date of Service: 01/18/2021 8:15 AM Medical Record Number: [id number] Patient Account Number: [id number] Date of Birth/Sex: [birth year] (40 y.o. M) Treating RN: Dolan Amen Primary Care Tylena Prisk: [name] Other Clinician: Referring Byrl Latin: [name] Treating Brit Carbonell/Extender: [name] Cliche in Treatment: 215 Wound Status Wound Number: 1 Primary Etiology: Pyoderma Wound Location: Left, Lateral Lower Leg Wound Status: Open Wounding Event: Gradually Appeared Comorbid  History: Sleep Apnea, Hypertension, Colitis Date Acquired: 11/18/2015 Weeks Of Treatment: 215 Clustered Wound: No Photos Wound Measurements Length: (cm) 6 Width: (cm) 6 Depth: (cm) 0.7 Area: (cm) 28.274 Volume: (cm) 19.792 % Reduction in Area: -476% % Reduction in Volume: -404% Epithelialization: None Tunneling: No Undermining: Yes Starting Position (o'clock): 6 Ending Position (o'clock): 8 Maximum Distance: (cm) 0.7 Wound Description Classification: Full Thickness With Exposed Support Structures Exudate Amount: Large Exudate Type: Purulent Exudate Color: yellow, brown, green Foul Odor After Cleansing: No Slough/Fibrino Yes Wound Bed Granulation Amount: Large (67-100%) Exposed Structure Granulation Quality: Red, Hyper-granulation Fascia Exposed: No Necrotic Amount: Small (1-33%) Fat Layer (Subcutaneous Tissue) Exposed: Yes Necrotic Quality: Adherent Slough Tendon Exposed: No Muscle Exposed: Yes Necrosis of Muscle: No Joint Exposed: No Bone Exposed: No Electronic Signature(s) Signed: 01/18/2021 4:48:21 PM By: Dolan Amen RN Entered By: Dolan Amen on 01/18/2021 08:38:52 [name] ([id number]) -------------------------------------------------------------------------------- [place] Details Patient Name: [name] Date of Service: 01/18/2021 8:15 AM Medical Record Number: [id number] Patient Account Number: [id number] Date of Birth/Sex: 09/02/1980 (40 y.o. M) Treating RN: Dolan Amen Primary Care Meridee Branum: [name] Other Clinician: Referring Hampton Cost: [name] Treating Devine Dant/Extender: [name] Cliche in Treatment: 215 Vital Signs Time Taken: 08:27 Temperature (F): 98.8 Height (in): 71 Pulse (bpm): 83 Weight (lbs): 338 Respiratory Rate (breaths/min): 18 Body Mass Index (BMI): 47.1 Blood Pressure (mmHg): 129/81 Reference Range: 80 - 120 mg / dl Electronic Signature(s) Signed: 01/18/2021 4:48:21 PM By: Dolan Amen RN Entered  By: Dolan Amen on 01/18/2021 08:28:00

## 2021-01-18 NOTE — Progress Notes (Addendum)
[name], [name] ([id number]) Visit Report for 01/18/2021 Chief Complaint Document Details Patient Name: [name] Date of Service: 01/18/2021 8:15 AM Medical Record Number: [id number] Patient Account Number: [id number] Date of Birth/Sex: 1981/04/29 (40 y.o. 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 M) Treating RN: Dolan Amen Primary Care Provider: [name] Other Clinician: Referring Provider: [name] Treating Provider/Extender: [name] Cliche in Treatment: 52 History of Present Illness HPI Description: 12/04/16; 40 year old man who comes into the clinic today for review of a wound on the posterior left calf. He tells me that is been there for about a year. He is not a diabetic he does smoke half a pack per day. He was seen in the ER on 11/20/16 felt to have cellulitis around the wound and was given clindamycin. An x-ray did not show osteomyelitis. The patient initially tells me that he has a milk allergy that sets off a pruritic itching rash on his lower legs which she scratches incessantly and he thinks that's what may have set up the wound. He has been using various topical antibiotics and ointments without any effect. He works in a [employer] and is on his feet all day. He does not have a prior history  of wounds however he does have the rash on both lower legs the right arm and the ventral aspect of his left arm. These are excoriations and clearly have had scratching however there are of macular looking areas on both legs including a substantial larger area on the right leg. This does not have an underlying open area. There is no blistering. The patient tells me that 2 years ago in [place] in response to the rash on his legs he saw a dermatologist who told him he had a condition which may be pyoderma gangrenosum although I may be putting words into his mouth. He seemed to recognize this. On further questioning he admits to a 5 year history of quiesced. ulcerative colitis. He is not in any treatment for this. He's had no recent travel 12/11/16; the patient arrives today with his wound and roughly the same condition we've been using silver alginate this is a deep punched out wound with some surrounding erythema but no tenderness. Biopsy I did did not show confirmed pyoderma gangrenosum suggested nonspecific inflammation and vasculitis but does not provide an actual description of what was seen by the pathologist. I'm really not able to understand this We have also received information from the patient's dermatologist in [place] notes from April 2016. This was a doctor Agarwal-antal. 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 He was prescribed topical steroid high potency under occlusion which helped but at this point the patient did not have a deep punched out wound. 12/18/16; the patient's wound is larger in terms of surface area however this surface looks better and there is less depth. The surrounding erythema also is better. The patient states that the wrap we put on came off 2 days ago when he has been using his compression stockings.  He we are in the process of getting a dermatology consult. 12/26/16 on evaluation today patient's left lower extremity wound shows evidence of infection with  surrounding erythema noted. He has been tolerating the dressing changes but states that he has noted more discomfort. There is a larger area of erythema surrounding the wound. No fevers, chills, nausea, or vomiting noted at this time. With that being said the wound still does have slough covering the surface. He is not allergic to any medication that he is aware of at this point. In regard to his right lower extremity he had several regions that are erythematous and pruritic he wonders if there's anything we can do to help that. 01/02/17 I reviewed patient's wound culture which was obtained his visit last week. He was placed on doxycycline at that point. Unfortunately that does not appear to be an antibiotic that would likely help with the situation however the pseudomonas noted on culture is sensitive to Cipro. Also unfortunately patient's wound seems to have a large compared to last week's evaluation. Not severely so but there are definitely increased measurements in general. He is continuing to have discomfort as well he writes this to be a seven out of 10. In fact he would prefer me not to perform any debridement today due to the fact that he is having discomfort and considering he has an active infection on the little reluctant to do so anyway. No fevers, chills, nausea, or vomiting noted at this time. 01/08/17; patient seems dermatology on September 5. I suspect dermatology will want the slides from the biopsy I did sent to their pathologist. I'm not sure if there is a way we can expedite that. In any case the culture I did before I left on vacation 3 weeks ago showed Pseudomonas he was given 10 days of Cipro and per her description of her intake nurses is actually somewhat better this week although the wound is quite a bit bigger than I remember the last time I saw this. He still has 3 more days of Cipro 01/21/17; dermatology appointment tomorrow. He has completed the ciprofloxacin for Pseudomonas.  Surface of the wound looks better however he is had some deterioration in the lesions on his right leg. Meantime the left lateral leg wound we will continue with sample 01/29/17; patient had his dermatology appointment but I can't yet see that note. He is completed his antibiotics. The wound is more superficial but considerably larger in circumferential area than when he came in. This is in his left lateral calf. He also has swollen erythematous areas with superficial wounds on the right leg and small papular areas on both arms. There apparently areas in her his upper thighs and buttocks I did not look at those. Dermatology biopsied the right leg. Hopefully will have their input next week. 02/05/17; patient went back to see his dermatologist who told him that he had a "scratching problem" as well as staph. He is now on a 30 day course of doxycycline and I believe she gave him triamcinolone cream to the right leg areas to help with the itching [not exactly sure but probably triamcinolone]. She apparently looked at the left lateral leg wound although this was not rebiopsied and I think felt to be ultimately part of the same pathogenesis. He is using sample border foam and changing nevus himself. He now has a new open area on the right posterior leg which was his biopsy site I don't have any of the dermatology notes  02/12/17; we put the patient in compression last week with SANTYL to the wound on the left leg and the biopsy. Edema is much better and the depth of the wound is now at level of skin. Area is still the same oBiopsy site on the right lateral leg we've also been using santyl with a border foam dressing and he is changing this himself. 02/19/17; Using silver alginate started last week to both the substantial left leg wound and the biopsy site on the right wound. He is tolerating compression well. Has a an appointment with his primary M.D. tomorrow wondering about diuretics although I'm wondering if  the edema problem is actually lymphedema 02/26/17; the patient has been to see his primary doctor Dr. Jerrel Ivory at [place] our primary care. She started him on Lasix 20 mg and this seems to have helped with the edema. However we are not making substantial change with the left lateral calf wound and inflammation. The biopsy site on the right leg also looks stable but not really all that different. 03/12/17; the patient has been to see vein and vascular Dr. Lucky Cowboy. He has had venous reflux studies I have not reviewed these. I did get a call from his dermatology office. They felt that he might have pathergy based on their biopsy on his right leg which led them to look at the slides of JEISON, DELPILAR ([id number]) the biopsy I did on the left leg and they wonder whether this represents pyoderma gangrenosum which was the original supposition in a man with ulcerative colitis albeit inactive for many years. They therefore recommended clobetasol and tetracycline i.e. 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 03/26/17; apparently the patient just had reflux studies not an appointment with Dr. dew. She arrives in clinic today having applied clobetasol for 2-3 weeks. He notes over the last 2-3 days excessive drainage having to change the dressing 3-4 times a day and also expanding erythema. 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 Lillia Carmel at [facility] dermatology locally who kindly saw him at my request. 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 My concern was the degree of erythema medially extending from his left leg wound which was either inflammation from pyoderma or cellulitis. I  put him on Augmentin however culture of the wound showed Pseudomonas which is quinolone sensitive. I really don't believe he has cellulitis however in view of everything I will continue and give him a course of Cipro. He is also on doxycycline as an immune modulator for the pyoderma. In addition to his original wound on the left lateral leg with surrounding erythema he has a wound on the right posterior calf which was an original biopsy site done by dermatology. 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 Saw Dr. Lillia Carmel yesterday. He has been using topical antibiotics to both wound areas his original wound on the left and the biopsies/pathergy area on the right. There is definitely some improvement in the inflammation around the wound on the right although the patient states he has increasing sensitivity of the wounds. He is on prednisone 60 and doxycycline 1 as prescribed by Dr. Lillia Carmel. He is covering the topical antibiotic with gauze  and putting this in his own compression stocks and changing this daily. He states that Dr. Lottie Rater did a culture of the left leg wound yesterday 05/07/17; pyoderma gangrenosum. The patient saw Dr. Lillia Carmel yesterday and has a follow-up with her in one month. He is still using topical antibiotics to both wounds although he can't recall exactly what type. He is still on prednisone 60 mg. Dr. Lillia Carmel stated that the doxycycline could stop if we were in agreement. 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 The right medial leg was induced by biopsy/pathergy. The area on the right is essentially healed. 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 The right medial leg has closed and remains closed. He is still on  prednisone 60. oHe tells me he missed his last dermatology appointment with Dr. Lillia Carmel but will make another appointment. He reports that her blood sugar at a recent screen in [place] was high 200's. He was 180 today. 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 I think he is going to require still much longer prednisone perhaps another 3 months before attempting to taper. In the meantime his wound is a lot better. Smaller. He is cleaning this off daily and applying topical antibiotics. When he was last in the clinic I thought about changing to [facility] and actually put in a couple of calls to dermatology although probably not during their business hours. 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 He continues on oral prednisone. He has been using triple antibiotic ointment. There is surface debris and we will transition to [facility] and have him return in 2 weeks. He has lost 30 pounds since his last appointment with lifestyle modification. He may benefit from topical steroid cream for treatment this can be considered at a later date. 08/22/17 on evaluation today patient appears to actually be doing rather well in regard to his left lateral lower extremity ulcer. He has actually been managed by Dr. Dellia Nims most recently. Patient is currently on oral steroids at this time. This seems to have been of benefit for him. Nonetheless his last visit was actually with Leah on 08/06/17. Currently he is not utilizing any topical steroid creams although this could be of benefit as well. No fevers, chills, nausea, or vomiting noted at this time. 09/05/17 on evaluation today patient appears to be doing better in regard to his left lateral lower extremity ulcer. He has been tolerating the dressing changes without complication. He is using Santyl with good effect. Overall I'm very  pleased with how things are standing at this point. Patient likewise is happy that this is doing better. 09/19/17 on evaluation today patient actually appears to be doing rather well in regard to his left lateral lower extremity ulcer. 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 He's not having any significant pain. 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 He has been tolerating the Santyl without complication and in general I feel like he's making good progress. 10/17/17 on evaluation today patient  appears to be doing very well in regard to his left lateral lower surety ulcer. He has been tolerating the dressing changes without complication. There does not appear to be any evidence of infection he's alternating the Santyl and the triple antibiotic ointment every other day this seems to be doing well for him. 11/03/17 on evaluation today patient appears to be doing very well in regard to his left lateral lower extremity ulcer. He is been tolerating the dressing changes without complication which is good news. Fortunately there does not appear to be any evidence of infection which is also great news. Overall is doing excellent they are starting to taper down on the prednisone is down to 40 mg at this point it also started topical clobetasol for him. 11/17/17 on evaluation today patient appears to be doing well in regard to his left lateral lower surety ulcer. He's been tolerating the dressing changes without complication. He does note that he is having no pain, no excessive drainage or discharge, and overall he feels like things are going about how he would expect and hope they would. Overall he seems to have no evidence of infection at this time in my opinion which is good news. 12/04/17-He is seen in follow-up evaluation  for right lateral lower extremity ulcer. He has been applying topical steroid cream. Today's measurement show slight increase in size. Over the next 2 weeks we will transition to every other day Santyl and steroid cream. He has been encouraged to monitor for changes and notify clinic with any concerns 12/15/17 on evaluation today patient's left lateral motion the ulcer and fortunately is doing worse again at this point. This just since last week to this week has close to doubled in size according to the patient. I did not seeing last week's I do not have a visual to compare this to in our system was also down so we do not have all the charts and at this point. Nonetheless it does have me somewhat concerned in regard to the fact that again he was worried enough about it he has contact the dermatology that placed them back on the full strength, 50 mg a day of the prednisone that he was taken previous. He continues to alternate using clobetasol along with Santyl at this point. He is obviously somewhat frustrated. 12/22/17 on evaluation today patient appears to be doing a little worse compared to last evaluation. Unfortunately the wound is a little deeper and slightly larger than the last week's evaluation. With that being said he has made some progress in regard to the irritation surrounding at this time unfortunately despite that progress that's been made he still has a significant issue going on here. 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 LEM, PEARY ([id number]) For that reason I discussed with him today the possibility of perform a wound culture to ensure there's nothing overtly infected. 01/06/18 on evaluation today patient's wound is larger and deeper than previously evaluated. With that being said it did appear that his wound was infected after  my last evaluation with him. Subsequently I did end up prescribing a prescription for Bactrim DS which she has been taking and having no complication with. Fortunately there does not appear to be any evidence of infection at this point in time as far as anything spreading, no want to touch, and overall I feel like  things are showing signs of improvement. 01/13/18 on evaluation today patient appears to be even a little larger and deeper than last time. There still muscle exposed in the base of the wound. Nonetheless he does appear to be less erythematous I do believe inflammation is calming down also believe the infection looks like it's probably resolved at this time based on what I'm seeing. No fevers, chills, nausea, or vomiting noted at this time. 01/30/18 on evaluation today patient actually appears to visually look better for the most part. Unfortunately those visually this looks better he does seem to potentially have what may be an abscess in the muscle that has been noted in the central portion of the wound. This is the first time that I have noted what appears to be fluctuance in the central portion of the muscle. With that being said I'm somewhat more concerned about the fact that this might indicate an abscess formation at this location. I do believe that an ultrasound would be appropriate. This is likely something we need to try to do as soon as possible. He has been switch to mupirocin ointment and he is no longer using the steroid ointment as prescribed by dermatology he sees them again next week he's been decreased from 60 to 40 mg of prednisone. 03/09/18 on evaluation today patient actually appears to be doing a little better compared to last time I saw him. There's not as much erythema surrounding the wound itself. He I did review his most recent infectious disease note which was dated 02/24/18. He saw Dr. Michel Bickers in [place]. 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 Patient is now off of antibiotics and they are continually observing this. There seems to be no change in the past two weeks in my pinion based on what the patient says and what I see today compared to what Dr. Megan Salon likely saw two weeks ago. No fevers, chills, nausea, or vomiting noted at this time. 03/23/18 on evaluation today patient's wound actually appears to be showing signs of improvement which is good news. He is currently still on the Dapsone. He is also working on tapering the prednisone to get off of this and Dr. Lottie Rater is working with him in this regard. Nonetheless overall I feel like the wound is doing well it does appear based on the infectious disease note that I reviewed from Dr. 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 I did also review the results of his ultrasound of left lower extremity which revealed there was a dentist tissue in the base of the wound without an abscess noted. 04/06/18 on evaluation today the patient's left lateral lower extremity ulcer actually appears to be doing fairly well which is excellent news. There does not appear to be any evidence of infection at this time which is also great news. Overall he still does have a significantly large ulceration although little by little he seems to be making progress. He is down to 10 mg a day of the prednisone. 04/20/18 on evaluation today patient actually appears to be doing excellent at this time in regard to his left lower extremity ulcer. He's making signs of good progress unfortunately this is taking much longer than we would really like to see but nonetheless he is  making progress. Fortunately there does not appear to be any evidence of infection at this time. No fevers, chills, nausea, or vomiting noted at this time.  The patient has not been using the Santyl due to the cost he hadn't got in this field yet. He's mainly been using the antibiotic ointment topically. Subsequently he also tells me that he really has not been scrubbing in the shower I think this would be helpful again as I told him it doesn't have to be anything too aggressive to even make it believe just enough to keep it free of some of the loose slough and biofilm on the wound surface. 05/11/18 on evaluation today patient's wound appears to be making slow but sure progress in regard to the left lateral lower extremity ulcer. He is been tolerating the dressing changes without complication. Fortunately there does not appear to be any evidence of infection at this time. He is still just using triple antibiotic ointment along with clobetasol occasionally over the area. He never got the Santyl and really does not seem to intend to in my pinion. 06/01/18 on evaluation today patient appears to be doing a little better in regard to his left lateral lower extremity ulcer. He states that overall he does not feel like he is doing as well with the Dapsone as he did with the prednisone. Nonetheless he sees his dermatologist later today and is gonna talk to them about the possibility of going back on the prednisone. Overall again I believe that the wound would be better if you would utilize Santyl but he really does not seem to be interested in going back to the [place] at this point. He has been using triple antibiotic ointment. 06/15/18 on evaluation today patient's wound actually appears to be doing about the same at this point. Fortunately there is no signs of infection at this time. He has made slight improvements although he continues to not really want to clean the wound bed at this point. He states that he just doesn't mess with it he doesn't want to cause any problems with everything else he has going on. He has been on medication, antibiotics as prescribed  by his dermatologist, for a staff infection of his lower extremities which is really drying out now and looking much better he tells me. Fortunately there is no sign of overall infection. 06/29/18 on evaluation today patient appears to be doing well in regard to his left lateral lower surety ulcer all things considering. Fortunately his staff infection seems to be greatly improved compared to previous. He has no signs of infection and this is drying up quite nicely. He is still the doxycycline for this is no longer on cental, Dapsone, or any of the other medications. His dermatologist has recommended possibility of an infusion but right now he does not want to proceed with that. 07/13/18 on evaluation today patient appears to be doing about the same in regard to his left lateral lower surety ulcer. Fortunately there's no signs of infection at this time which is great news. Unfortunately he still builds up a significant amount of Slough/biofilm of the surface of the wound he still is not really cleaning this as he should be appropriately. Again I'm able to easily with saline and gauze remove the majority of this on the surface which if you would do this at home would likely be a dramatic improvement for him as far as getting the area to improve. Nonetheless overall I still feel like he  is making progress is just very slow. I think Santyl will be of benefit for him as well. Still he has not gotten this as of this point. 07/27/18 on evaluation today patient actually appears to be doing little worse in regards of the erythema around the periwound region of the wound he also tells me that he's been having more drainage currently compared to what he was experiencing last time I saw him. He states not quite as bad as what he had because this was infected previously but nonetheless is still appears to be doing poorly. Fortunately there is no evidence of systemic infection at this point. The patient tells me that  he is not going to be able to afford the Santyl. He is still waiting to hear about the infusion therapy with his dermatologist. Apparently she wants an updated colonoscopy first. 08/10/18 on evaluation today patient appears to be doing better in regard to his left lateral lower extremity ulcer. Fortunately he is showing signs of improvement in this regard he's actually been approved for Remicade infusion's as well although this has not been scheduled as of yet. Fortunately there's no signs of active infection at this time in regard to the wound although he is having some issues with infection of the right lower extremity is been seen as dermatologist for this. Fortunately they are definitely still working with him trying to keep things under control. BO, TEICHER ([id number]) 09/07/18 on evaluation today patient is actually doing rather well in regard to his left lateral lower extremity ulcer. He notes these actually having some hair grow back on his extremity which is something he has not seen in years. He also tells me that the pain is really not giving them any trouble at this time which is also good news overall she is very pleased with the progress he's using a combination of the mupirocin along with the probate is all mixed. 09/21/18 on evaluation today patient actually appears to be doing fairly well all things considered in regard to his looks from the ulcer. He's been tolerating the dressing changes without complication. Fortunately there's no signs of active infection at this time which is good news he is still on all antibiotics or prevention of the staff infection. He has been on prednisone for time although he states it is gonna contact his dermatologist and see if she put them on a short course due to some irritation that he has going on currently. Fortunately there's no evidence of any overall worsening this is going very slow I think cental would be something that would be helpful for him  although he states that $50 for tube is quite expensive. He therefore is not willing to get that at this point. 10/06/18 on evaluation today patient actually appears to be doing decently well in regard to his left lateral leg ulcer. He's been tolerating the dressing changes without complication. Fortunately there's no signs of active infection at this time. Overall I'm actually rather pleased with the progress he's making although it's slow he doesn't show any signs of infection and he does seem to be making some improvement. I do believe that he may need a switch up and dressings to try to help this to heal more appropriately and quickly. 10/19/18 on evaluation today patient actually appears to be doing better in regard to his left lateral lower extremity ulcer. This is shown signs of having much less Slough buildup at this point due to the fact he has been using  the Santyl. Obviously this is very good news. The overall size of the wound is not dramatically smaller but again the appearance is. 11/02/18 on evaluation today patient actually appears to be doing quite well in regard to his lower [place] ulcer. A lot of the skin around the ulcer is actually somewhat irritating at this point this seems to be more due to the dressing causing irritation from the adhesive that anything else. Fortunately there is no signs of active infection at this time. 11/24/18 on evaluation today patient appears to be doing a little worse in regard to his overall appearance of his lower extremity ulcer. There's more erythema and warmth around the wound unfortunately. He is currently on doxycycline which he has been on for some time. With that being said I'm not sure that seems to be helping with what appears to possibly be an acute cellulitis with regard to his left lower extremity ulcer. No fevers, chills, nausea, or vomiting noted at this time. 12/08/18 on evaluation today patient's wounds actually appears to be doing  significantly better compared to his last evaluation. He has been using Santyl along with alternating tripling about appointment as well as the steroid cream seems to be doing quite well and the wound is showing signs of improvement which is excellent news. Fortunately there's no evidence of infection and in fact his culture came back negative with only normal skin flora noted. 12/21/2018 upon evaluation today patient actually appears to be doing excellent with regard to his ulcer. This is actually the best that I have seen it since have been helping to take care of him. It is both smaller as well as less slough noted on the surface of the wound and seems to be showing signs of good improvement with new skin growing from the edges. He has been using just the triamcinolone he does wonder if he can get a refill of that ointment today. 01/04/2019 upon evaluation today patient actually appears to be doing well with regard to his left lateral lower extremity ulcer. With that being said it does not appear to be that he is doing quite as well as last time as far as progression is concerned. There does not appear to be any signs of infection or significant irritation which is good news. With that being said I do believe that he may benefit from switching to a collagen based dressing based on how clean The wound appears. 01/18/2019 on evaluation today patient actually appears to be doing well with regard to his wound on the left lower extremity. He is not made a lot of progress compared to where we were previous but nonetheless does seem to be doing okay at this time which is good news. There is no signs of active infection which is also good news. My only concern currently is I do wish we can get him into utilizing the collagen dressing his insurance would not pay for the supplies that we ordered although it appears that he may be able to order this through his supply company that he typically utilizes. This is  [employer]. Nonetheless he did try to order it during the office visit today and it appears this did go through. We will see if he can get that it is a different brand but nonetheless he has collagen and I do think will be beneficial. 02/01/2019 on evaluation today patient actually appears to be doing a little worse today in regard to the overall size of his wounds. Fortunately there  is no signs of active infection at this time. That is visually. Nonetheless when this is happened before it was due to infection. For that reason were somewhat concerned about that this time as well. 02/08/2019 on evaluation today patient unfortunately appears to be doing slightly worse with regard to his wound upon evaluation today. Is measuring a little deeper and a little larger unfortunately. I am not really sure exactly what is causing this to enlarge he actually did see his dermatologist she is going to see about initiating Humira for him. Subsequently she also did do steroid injections into the wound itself in the periphery. Nonetheless still nonetheless he seems to be getting a little bit larger he is gone back to just using the steroid cream topically which I think is appropriate. I would say hold off on the collagen for the time being is definitely a good thing to do. Based on the culture results which we finally did get the final result back regarding it shows staph as the bacteria noted again that can be a normal skin bacteria based on the fact however he is having increased drainage and worsening of the wound measurement wise I would go ahead and place him on an antibiotic today I do believe for this. 02/15/2019 on evaluation today patient actually appears to be doing somewhat better in regard to his ulcer. There is no signs of worsening at this time I did review his culture results which showed evidence of Staphylococcus aureus but not MRSA. Again this could just be more related to the normal skin bacteria  although he states the drainage has slowed down quite a bit he may have had a mild infection not just colonization. And was much smaller and then since around10/04/2019 on evaluation today patient appears to be doing unfortunately worse as far as the size of the wound. I really feel like that this is steadily getting larger again it had been doing excellent right at the beginning of September we have seen a steady increase in the area of the wound it is almost 2-1/2 times the size it was on September 1. Obviously this is a bad trend this is not wanting to see. For that reason we went back to using just the topical triamcinolone cream which does seem to help with inflammation. I checked him for bacteria by way of culture and nothing showed positive there. I am considering giving him a short course of a tapering steroid Dosepak today to see if that is can be beneficial for him. The patient is in agreement with giving that a try. 03/08/2019 on evaluation today patient appears to be doing very well in comparison to last evaluation with regard to his lower extremity ulcer. This is showing signs of less inflammation and actually measuring slightly smaller compared to last time every other week over the past month and a half he has been measuring larger larger larger. Nonetheless I do believe that the issue has been inflammation the prednisone does seem to [facility] ([id number]) have been beneficial for him which is good news. No fevers, chills, nausea, vomiting, or diarrhea. 03/22/2019 on evaluation today patient appears to be doing about the same with regard to his leg ulcer. He has been tolerating the dressing changes without complication. With that being said the wound seems to be mostly arrested at its current size but really is not making any progress except for when we prescribed the prednisone. He did show some signs of dropping as far as  the overall size of the wound during that interval week.  Nonetheless this is something he is not on long-term at this point and unfortunately I think he is getting need either this or else the Humira which his dermatologist has discussed try to get approval for. With that being said he will be seeing his dermatologist on the 11th of this month that is November. 04/19/2019 on evaluation today patient appears to be doing really about the same the wound is measuring slightly larger compared to last time I saw him. He has not been into the office since November 2 due to the fact that he unfortunately had Covid as that his entire family. He tells me that it was rough but they did pull-through and he seems to be doing much better. Fortunately there is no signs of active infection at this time. No fevers, chills, nausea, vomiting, or diarrhea. 05/10/2019 on evaluation today patient unfortunately appears to be doing significantly worse as compared to last time I saw him. He does tell me that he has had his first dose of Humira and actually is scheduled to get the next one in the upcoming week. With that being said he tells me also that in the past several days he has been having a lot of issues with green drainage she showed me a picture this is more blue-green in color. He is also been having issues with increased sloughy buildup and the wound does appear to be larger today. Obviously this is not the direction that we want everything to take based on the starting of his Humira. Nonetheless I think this is definitely a result of likely infection and to be honest I think this is probably Pseudomonas causing the infection based on what I am seeing. 05/24/2019 on evaluation today patient unfortunately appears to be doing significantly worse compared to his prior evaluation with me 2 weeks ago. I did review his culture results which showed that he does have Staph aureus as well as Pseudomonas noted on the culture. Nonetheless the Levaquin that I prescribed for him does  not appear to have been appropriate and in fact he tells me he is no longer experiencing the green drainage and discharge that he had at the last visit. Fortunately there is no signs of active infection at this time which is good news although the wound has significantly worsened it in fact is much deeper than it was previous. We have been utilizing up to this point triamcinolone ointment as the prescription topical of choice but at this time I really feel like that the wound is getting need to be packed in order to appropriately manage this due to the deeper nature of the wound. Therefore something along the lines of an alginate dressing may be more appropriate. 05/31/2019 upon inspection today patient's wound actually showed signs of doing poorly at this point. Unfortunately he just does not seem to be making any good progress despite what we have tried. He actually did go ahead and pick up the Cipro and start taking that as he was noticing more green drainage he had previously completed the Levaquin that I prescribed for him as well. Nonetheless he missed his appointment for the seventh last week on Wednesday with the wound care center and [facility] where his dermatologist referred him. Obviously I do think a second opinion would be helpful at this point especially in light of the fact that the patient seems to be doing so poorly despite the fact  that we have tried everything that I really know how at this point. The only thing that ever seems to have helped him in the past is when he was on high doses of continual steroids that did seem to make a difference for him. 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 06/07/2019 upon evaluation today patient unfortunately appears to be doing worse yet again with regard to his wound. In fact I am  starting to question whether or not he may have a fluid pocket in the muscle at this point based on the bulging and the soft appearance to the central portion of the muscle area. There is not anything draining from the muscle itself at this time which is good news but nonetheless the wound is expanding. I am not really seeing any results of the Humira as far as overall wound progression based on what I am seeing at this point. The patient has been referred for second opinion with regard to his wound to the [facility] wound care center by his dermatologist which I definitely am not in opposition to. Unfortunately we tried multiple dressings in the past including collagen, alginate, and at one point even Hydrofera Blue. With that being said he is never really used it for any significant amount of time due to the fact that he often complains of pain associated with these dressings and then will go back to either using the Santyl which she has done intermittently or more frequently the triamcinolone. He is also using his own compression stockings. We have wrapped him in the past but again that was something else that he really was not a big fan of. 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 He seemed to do much better when he was on the steroids although honestly I understand that the steroids are not necessarily the best medication to be on long-term obviously 06/14/2019 on evaluation today patient appears to be doing actually a little bit better with regard to the overall appearance with his leg. Unfortunately he does continue to have issues with what appears to be some fluid underneath the  muscle although he did see the wound specialty center at [facility] last week their main goals were to see about infusion therapy in place of the Humira as they feel like that is not quite strong enough. They also recommended that we continue with the treatment otherwise as we are they felt like that was appropriate and they are okay with him continuing to follow-up here with [place] in that regard. With that being said they are also sending him to the vein specialist there to see about vein stripping and if that would be of benefit for him. Subsequently they also did not really address whether or not an ultrasound of the muscle area to see if there is anything that needs to be addressed here would be appropriate or not. For that reason I discussed this with him last week I think we  may proceed down that road at this point. 06/21/2019 upon evaluation today patient's wound actually appears to be doing slightly better compared to previous evaluations. I do believe that he has made a difference with regard to the progression here with the use of oral steroids. Again in the past has been the only thing that is really calm things down. He does tell me that from [facility] is gotten a good news from there that there are no further vein stripping that is necessary at this point. I do not have that available for review today although the patient did relay this to me. He also did obtain and have the ultrasound of the wound completed which I did sign off on today. It does appear that there is no fluid collection under the muscle this is likely then just edematous tissue in general. That is also good news. Overall I still believe the inflammation is the main issue here. He did inquire about the possibility of a wound VAC again with the muscle protruding like it is I am not really sure whether the wound VAC is necessarily ideal or not. That is something we will have to consider although I do believe he may need compression wrapping to  try to help with edema control which could potentially be of benefit. 06/28/2019 on evaluation today patient appears to be doing slightly better measurement wise although this is not terribly smaller he least seems to be trending towards that direction. With that being said he still seems to have purulent drainage noted in the wound bed at this time. He has been on Levaquin followed by Cipro over the past month. Unfortunately he still seems to have some issues with active infection at this time. I did perform a culture last week in order to evaluate and see if indeed there was still anything going on. Subsequently the culture did come back showing Pseudomonas which is consistent with the drainage has been having which is blue-green in color. He also has had an odor that again was somewhat consistent with Pseudomonas as well. Long story short it appears that the culture showed an intermediate finding with regard to how well the Cipro will work for the Pseudomonas infection. Subsequently being that he does not seem to be clearing up and at best what we are doing is just keeping this at [place] I think he may need to see infectious disease to discuss IV antibiotic options. RONNEY, HONEYWELL ([id number]) 07/05/2019 upon evaluation today patient appears to be doing okay in regard to his leg ulcer. He has been tolerating the dressing changes at this point without complication. Fortunately there is no signs of active infection at this time which is good news. No fevers, chills, nausea, vomiting, or diarrhea. With that being said he does have an appointment with infectious disease tomorrow and his primary care on Wednesday. Again the reason for the infectious disease referral was due to the fact that he did not seem to be fully resolving with the use of oral antibiotics and therefore we were thinking that IV antibiotic therapy may be necessary secondary to the fact that there was an intermediate finding for  how effective the Cipro may be. Nonetheless again he has been having a lot of purulent and even green drainage. Fortunately right now that seems to have calmed down over the past week with the reinitiation of the oral antibiotic. Nonetheless we will see what Dr. Megan Salon has to say. 07/12/2019 upon evaluation today patient appears to be  doing about the same at this point in regard to his left lower extremity ulcer. Fortunately there is no signs of active infection at this time which is good news I do believe the Levaquin has been beneficial I did review Dr. Hale Bogus note and to be honest I agree that the patient's leg does appear to be doing better currently. What we found in the past as he does not seem to really completely resolve he will stop the antibiotic and then subsequently things will revert back to having issues with blue-green drainage, increased pain, and overall worsening in general. Obviously that is the reason I sent him back to infectious disease. 07/19/2019 upon evaluation today patient appears to be doing roughly the same in size there is really no dramatic improvement. He has started back on the Levaquin at this point and though he seems to be doing okay he did still have a lot of blue/green drainage noted on evaluation today unfortunately. I think that this is still indicative more likely of a Pseudomonas infection as previously noted and again he does see Dr. Megan Salon in just a couple of days. I do not know that were really able to effectively clear this with just oral antibiotics alone based on what I am seeing currently. Nonetheless we are still continue to try to manage as best we can with regard to the patient and his wound. I do think the wrap was helpful in decreasing the edema which is excellent news. No fevers, chills, nausea, vomiting, or diarrhea. 07/26/2019 upon evaluation today patient appears to be doing slightly better with regard to the overall appearance of the muscle  there is no dark discoloration centrally. Fortunately there is no signs of active infection at this time. No fevers, chills, nausea, vomiting, or diarrhea. Patient's wound bed currently the patient did have an appointment with Dr. Megan Salon at infectious disease last week. With that being said Dr. Megan Salon the patient states was still somewhat hesitant about put him on any IV antibiotics he wanted [place] to repeat cultures today and then see where things go going forward. He does look like Dr. Megan Salon because of some improvement the patient did have with the Levaquin wanted [place] to see about repeating cultures. If it indeed grows the Pseudomonas again then he recommended a possibility of considering a PICC line placement and IV antibiotic therapy. He plans to see the patient back in 1 to 2 weeks. 08/02/2019 upon evaluation today patient appears to be doing poorly with regard to his left lower extremity. We did get the results of his culture back it shows that he is still showing evidence of Pseudomonas which is consistent with the purulent/blue-green drainage that he has currently. Subsequently the culture also shows that he now is showing resistance to the oral fluoroquinolones which is unfortunate as that was really the only thing to treat the infection prior. I do believe that he is looking like this is going require IV antibiotic therapy to get this under control. Fortunately there is no signs of systemic infection at this time which is good news. The patient does see Dr. Megan Salon tomorrow. 08/09/2019 upon evaluation today patient appears to be doing better with regard to his left lower extremity ulcer in regard to the overall appearance. He is currently on IV antibiotic therapy. As ordered by Dr. Megan Salon. Currently the patient is on ceftazidime which she is going to take for the next 2 weeks and then follow-up for 4 to 5-week appointment with Dr. Megan Salon.  The patient started this this past Friday  symptoms have not for a total of 3 days currently in full. 08/16/2019 upon evaluation today patient's wound actually does show muscle in the base of the wound but in general does appear to be much better as far as the overall evidence of infection is concerned. In fact I feel like this is for the most part cleared up he still on the IV antibiotics he has not completed the full course yet but I think he is doing much better which is excellent news. 08/23/2019 upon evaluation today patient appears to be doing about the same with regard to his wound at this point. He tells me that he still has pain unfortunately. Fortunately there is no evidence of systemic infection at this time which is great news. There is significant muscle protrusion. 09/13/19 upon evaluation today patient appears to be doing about the same in regard to his leg unfortunately. He still has a lot of drainage coming from the ulceration there is still muscle exposed. With that being said the patient's last wound culture still showed an intermediate finding with regard to the Pseudomonas he still having the bluish/green drainage as well. Overall I do not know that the wound has completely cleared of infection at this point. Fortunately there is no signs of active infection systemically at this point which is good news. 09/20/2019 upon evaluation today patient's wound actually appears to be doing about the same based on what I am seeing currently. I do not see any signs of systemic infection he still does have evidence of some local infection and drainage. He did see Dr. Megan Salon last week and Dr. Megan Salon states that he probably does need a different IV antibiotic although he does not want to put him on this until the patient begins the Remicade infusion which is actually scheduled for about 10 days out from today on 13 May. Following that time Dr. Megan Salon is good to see him back and then will evaluate the feasibility of starting him on the  IV antibiotic therapy once again at that point. I do not disagree with this plan I do believe as Dr. Megan Salon stated in his note that I reviewed today that the patient's issue is multifactorial with the pyoderma being 1 aspect of this that were hoping the Remicade will be helpful for her. In the meantime I think the gentamicin is, helping to keep things under decent okay control in regard to the ulcer. 09/27/2019 upon evaluation today patient appears to be doing about the same with regard to his wound still there is a lot of muscle exposure though he does have some hyper granulation tissue noted around the edge and actually some granulation tissue starting to form over the muscle which is actually good news. Fortunately there is no evidence of active infection which is also good news. His pain is less at this point. 5/21; this is a patient I have not seen in a long time. 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 He has a large wound on the left lateral leg with protruding muscle. He comes in the clinic today showing the same area on his left medial ankle. He says there is been a spot there for some time although we have not previously defined this. Today he has a clearly defined area with slight amount of skin breakdown surrounded by raised areas with a purplish hue in color. This is not painful he says it is irritated.  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 He still has muscle protruding from the lateral portion of his left leg but fortunately the new area that they were concerned about at his last visit does not appear to have opened at this point. He is currently on Remicade infusions and seems to be doing better in my opinion in fact the wound itself seems to be overall much better. The purplish discoloration that he did have seems to have resolved  and I think that is a good sign that hopefully the Remicade is doing its job. He does have some biofilm noted over the surface of the wound. 11/01/2019 on evaluation today patient's wound actually appears to be doing excellent at this time. Fortunately there is no evidence of active infection and overall I feel like he is making great progress. The Remicade seems to be due excellent job in my opinion. ABUBAKR, WIEMAN ([id number]) 11/08/19 evaluation today vision actually appears to be doing quite well with regard to his weight ulcer. He's been tolerating dressing changes without complication. Fortunately there is no evidence of infection. No fevers, chills, nausea, or vomiting noted at this time. Overall states that is having more itching than pain which is actually a good sign in my opinion. 12/13/2019 upon evaluation today patient appears to be doing well today with regard to his wound. He has been tolerating the dressing changes without complication. Fortunately there is no sign of active infection at this time. No fevers, chills, nausea, vomiting, or diarrhea. Overall I feel like the infusion therapy has been very beneficial for him. 01/06/2020 on evaluation today patient appears to be doing well with regard to his wound. This is measuring smaller and actually looks to be doing better. Fortunately there is no signs of active infection at this point. No fevers, chills, nausea, vomiting, or diarrhea. With that being said he does still have the blue-green drainage but this does not seem to be causing any significant issues currently. He has been using the gentamicin that does seem to be keeping things under decent control at this point. He goes later this morning for his next infusion therapy for the pyoderma which seems to also be very beneficial. 02/07/2020 on evaluation today patient appears to be doing about the same in regard to his wounds currently. Fortunately there is no signs of active infection  systemically he does still have evidence of local infection still using gentamicin. He also is showing some signs of improvement albeit slowly I do feel like we are making some progress here. 02/21/2020 upon evaluation today patient appears to be making some signs of improvement the wound is measuring a little bit smaller which is great news and overall I am very pleased with where he stands currently. He is going to be having infusion therapy treatment on the 15th of this month. Fortunately there is no signs of active infection at this time. 03/13/2020 I do believe patient's wound is actually showing some signs of improvement here which is great news. He has continue with the infusion therapy through rheumatology/[facility]matology at [facility]. That does seem to be beneficial. I still think he gets as much benefit from this as he did from the prednisone initially but nonetheless obviously this is less harsh on his body that the prednisone as far as they are concerned. 03/31/2020 on evaluation today patient's wound actually showing signs of some pretty good improvement in regard  to the overall appearance of the wound bed. There is still muscle exposed though he does have some epithelial growth around the edges of the wound. Fortunately there is no signs of active infection at this time. No fevers, chills, nausea, vomiting, or diarrhea. 04/24/2020 upon evaluation today patient appears to be doing about the same in regard to his leg ulcer. He has been tolerating the dressing changes without complication. Fortunately there is no signs of active infection at this time. No fevers, chills, nausea, vomiting, or diarrhea. With that being said he still has a lot of irritation from the bandaging around the edges of the wound. We did discuss today the possibility of a referral to plastic surgery. 05/22/2020 on evaluation today patient appears to be doing well with regard to his wounds all things considered. He has not been able  to get the Chantix apparently there is a recall nurse that I was unaware of put out by [employer] involuntarily. Nonetheless for now I am and I have to do some research into what may be the best option for him to help with quitting in regard to smoking and we discussed that today. 06/26/2020 upon evaluation today patient appears to be doing well with regard to his wound from the standpoint of infection I do not see any signs of infection at this point. With that being said unfortunately he is still continuing to have issues with muscle exposure and again he is not having a whole lot of new skin growth unfortunately. There does not appear to be any signs of active infection at this time. No fevers, chills, nausea, vomiting, or diarrhea. 07/10/2020 upon evaluation today patient appears to be doing a little bit more poorly currently compared to where he was previous. I am concerned currently about an active infection that may be getting worse especially in light of the increased size and tenderness of the wound bed. No fevers, chills, nausea, vomiting, or diarrhea. 07/24/2020 upon evaluation today patient appears to be doing poorly in regard to his leg ulcer. He has been tolerating the dressing changes without complication but unfortunately is having a lot of discomfort. 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 Subsequently I think he is going require possibly IV antibiotics to get this under control. I am very concerned about the severity of his infection and the amount of discomfort he is having. 07/31/2020 upon evaluation today patient appears to be doing about the same in regard to his leg wound. He did see Dr. Megan Salon and Dr. Megan Salon is actually going to start him on IV antibiotics. He goes for the PICC line tomorrow. With that being said there do not have that run for 2 weeks and then see how things are doing and depending on how he  is progressing they may extend that a little longer. Nonetheless I am glad this is getting ready to be in place and definitely feel it may help the patient. In the meantime is been using mainly triamcinolone to the wound bed has an anti-inflammatory. 08/07/2020 on evaluation today patient appears to be doing well with regard to his wound compared even last week. In the interim he has gotten the PICC line placed and overall this seems to be doing excellent. There does not appear to be any evidence of infection which is great news systemically although locally of course has had the infection this appears to be improving with the use of the antibiotics. 08/14/2020 upon  evaluation today patient's wound actually showing signs of excellent improvement. Overall the irritation has significantly improved the drainage is back down to more of a normal level and his pain is really pretty much nonexistent compared to what it was. Obviously I think that this is significantly improved secondary to the IV antibiotic therapy which has made all the difference in the world. Again he had a resistant form of Pseudomonas for which oral antibiotics just was not cutting it. Nonetheless I do think that still we need to consider the possibility of a surgical closure for this wound is been open so long and to be honest with muscle exposed I think this can be very hard to get this to close outside of this although definitely were still working to try to do what we can in that regard. 08/21/2020 upon evaluation today patient appears to be doing very well with regard to his wounds on the left lateral lower extremity/calf area. Fortunately there does not appear to be signs of active infection which is great news and overall very pleased with where things stand today. He is actually wrapping up his treatment with IV antibiotics tomorrow. After that we will see where things go from there. 08/28/2020 upon evaluation today patient appears  to be doing decently well with regard to his leg ulcer. There does not appear to be any signs of active infection which is great news and overall very pleased with where things stand today. No fevers, chills, nausea, vomiting, or diarrhea. 09/18/2020 upon evaluation today patient appears to be doing well with regard to his infection which I feel like is better. Unfortunately he is not doing as well with regard to the overall size of the wound which is not nearly as good at this point. I feel like that he may be having an issue here with the pyoderma being somewhat out of control. I think that he may benefit from potentially going back and talking to the dermatologist about STEPHANIE, MCGLONE ([id number]) what to do from the pyoderma standpoint. I am not certain if the infusions are helping nearly as much is what the prednisone did in the past. 10/02/2020 upon evaluation today patient appears to be doing well with regard to his leg ulcer. He did go to the Psychiatric nurse. Unfortunately they feel like there is a 10% chance that most that he would be able to heal and that the skin graft would take. Obviously this has led him to not be able to go down that path as far as treatment is concerned. Nonetheless he does seem to be doing a little bit better with the prednisone that I gave him last time. I think that he may need to discuss with dermatology the possibility of long-term prednisone as that seems to be what is most helpful for him to be perfectly honest. I am not sure the Remicade is really doing the job. 10/17/2020 upon evaluation today patient appears to be doing a little better in regard to his wound. In fact the case has been since we did the prednisone on May 2 for him that we have noticed a little bit of improvement each time we have seen a size wise as well as appearance wise as well as pain wise. I think the prednisone has had a greater effect then the infusion therapy has to be perfectly honest. With  that being said the patient also feels significantly better compared to what he was previous. All of this is good news but  nonetheless I am still concerned about the fact that again we are really not set up to long-term manage him as far as prednisone is concerned. Obviously there are things that you need to be watched I completely understand the risk of prednisone usage as well. 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 With all that being said I do believe that we can give him another round of the prednisone which she is requesting today because of the improvement that he seen since we did that first round. 10/30/2020 upon evaluation today patient's wound actually is showing signs of doing quite well. There does not appear to be any evidence of infection which is great news and overall very pleased with where things stand today. No fevers, chills, nausea, vomiting, or diarrhea. He tells me that the prednisone still has seem to have helped he wonders if we can extend that for just a little bit longer. He did not have the appointment with a dermatologist although he did have an infusion appointment last Friday. That was at [facility]. With that being said he tells me he could not do both that as well as the appointment with the physician on the same day therefore that is can have to be rescheduled. I really want to see if there is anything they feel like that could be done differently to try to help this out as I am not really certain that the infusions are helping significantly here. 11/13/2020 upon evaluation today patient unfortunately appears to be doing somewhat poorly in regard to his wound I feel like this is actually worsening from the standpoint of the pyoderma spreading. I still feel like that he may need something different as far as trying to manage this going forward. Again we did the prednisone unfortunately his blood sugars are not doing so well  because of this. Nonetheless I believe that the patient likely needs to try topical steroid. We have done triamcinolone for a while I think going with something stronger such as clobetasol could be beneficial again this is not something I do lightly I discussed this with the patient that again this does not normally put underneath an occlusive dressing. Nonetheless I think a thin film as such could help with some of the stronger anti-inflammatory effects. We discussed this today. He would like to try to give this a trial for the next couple weeks. I definitely think that is something that we can do. Evaluate7/03/2021 and today patient's wound bed actually showed signs of doing really about the same. There was a little expansion of the size of the wound and that leading edge that we done looking out although the clobetasol does seem to have slowed this down a bit in my opinion. There is just 1 small area that still seems to be progressing based on what I see. Nonetheless I am concerned about the fact this does not seem to be improving if anything seems to be doing a little bit worse. I do not know that the infusions are really helping him much as next infusion is August 5 his appointment with dermatology is July 25. Either way I really think that we need to have a conversation potentially about this and I am actually going to see if I can talk with Dr. Lillia Carmel in order to see where things stand as well. 12/11/2020 upon evaluation today patient appears to be doing worse in regard to his leg ulcer. Unfortunately  I just do not think this is making the progress that I would like to see at this point. Honestly he does have an appointment with dermatology and this is in 2 days. I am wondering what they may have to offer to help with this. Right now what I am seeing is that he is continuing to show signs of worsening little by little. Obviously that is not great at all. Is the exact opposite of what we are  looking for. 12/18/2020 upon evaluation today patient appears to be doing a little better in regard to his wound. The dermatologist actually did do some steroid injections into the wound which does seem to have been beneficial in my opinion. That was on the 25th already this looks a little better to me than last time I saw him. With that being said we did do a culture and this did show that he has Staph aureus noted in abundance in the wound. With that being said I do think that getting him on an oral antibiotic would be appropriate as well. Also think we can compression wrap and this will make a difference as well. 12/28/2020 upon evaluation today patient's wound is actually showing signs of doing much better. I do believe the compression wrap is helping he has a lot of drainage but to be honest I think that the compression is helping to some degree in this regard as well as not draining through which is also good news. No fevers, chills, nausea, vomiting, or diarrhea. 01/04/2021 upon evaluation today patient appears to be doing well with regard to his wound. Overall things seem to be doing quite well. He did have a little bit of reaction to the CarboFlex Sorbact he will be using that any longer. With that being said he is controlled as far as the drainage is concerned overall and seems to be doing quite well. I do not see any signs of active infection at this time which is great news. No fevers, chills, nausea, vomiting, or diarrhea. 01/11/2021 upon evaluation today patient appears to be doing well with regard to his wounds. He has been tolerating the dressing changes without complication. Fortunately there does not appear to be any signs of active infection at this time which is great news. Overall I am extremely pleased with where we stand currently. No fevers, chills, nausea, vomiting, or diarrhea. Where using clobetasol in the wound bed he has a lot of new skin growth which is awesome as  well. 01/18/2021 upon evaluation today patient appears to be doing very well in regard to his leg ulcer. He has been tolerating the dressing changes without complication. Fortunately there does not appear to be any signs of active infection which is great news. In general I think that he is making excellent progress Electronic Signature(s) Signed: 01/18/2021 9:10:44 AM By: Worthy Keeler PA-C Entered By: Worthy Keeler on 01/18/2021 09:10:44 MARCANTHONY, SLEIGHT ([id number]) -------------------------------------------------------------------------------- Physical Exam Details Patient Name: [name] Date of Service: 01/18/2021 8:15 AM Medical Record Number: [id number] Patient Account Number: [id number] Date of Birth/Sex: [birth year] (39 y.o. M) Treating RN: Dolan Amen Primary Care Provider: [name] Other Clinician: Referring Provider: [name] Treating Provider/Extender: [name] Cliche in Treatment: 31 Constitutional Well-nourished and well-hydrated in no acute distress. Respiratory normal breathing without difficulty. Psychiatric this patient is able to make decisions and demonstrates good insight into disease process. Alert and Oriented x 3. pleasant and cooperative. Notes Upon inspection patient's wound bed actually showed signs of  good granulation epithelization at this point. In fact this appears to be doing excellent. I am extremely pleased with how things appear and I think that the patient is making wonderful progress. Electronic Signature(s) Signed: 01/18/2021 9:16:10 AM By: Worthy Keeler PA-C Entered By: Worthy Keeler on 01/18/2021 09:16:10 [name] ([id number]) -------------------------------------------------------------------------------- Physician Orders Details Patient Name: [name] Date of Service: 01/18/2021 8:15 AM Medical Record Number: [id number] Patient Account Number: [id number] Date of Birth/Sex: 04-Nov-1980 (39 y.o. 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 - Next Thursday o Nurse Visit as needed - Monday Bathing/ Shower/ Hygiene o Clean wound with Normal Saline or wound cleanser. Edema Control - Lymphedema / Segmental Compressive Device / Other o Optional: One layer of unna paste to top of compression wrap (to act as an anchor). o 4 Layer Compression System Lymphedema. Medications-Please add to medication list. o P.O. Antibiotics Wound Treatment Wound #1 - Lower Leg Wound Laterality: Left, Lateral Cleanser: Soap and Water 2 x Per Week/30 Days Discharge Instructions: Gently cleanse wound with antibacterial soap, rinse and pat dry prior to dressing wounds Peri-Wound Care: Desitin Maximum Strength Ointment 4 (oz) 2 x Per Week/30 Days Peri-Wound Care: Triamcinolone Acetonide Cream, 0.1%, 15 (g) tube 2 x Per Week/30 Days Discharge Instructions: Apply on posterior leg Topical: Clobetasol Propionate ointment 0.05%, 60 (g) tube 2 x Per Week/30 Days Discharge Instructions: Apply in undermining area and reddened area on posterior leg Primary Dressing: Calcium Alginate Dressing, 6x6 (in/in) 2 x Per Week/30 Days Discharge Instructions: Apply to wound bed Secondary Dressing: Xtrasorb Large 6x9 (in/in) 2 x Per Week/30 Days Discharge Instructions: Apply to wound as directed. Do not cut. Compression  Wrap: Medichoice 4 layer Compression System, 35-40 mmHG (Generic) 2 x Per Week/30 Days Discharge Instructions: Apply multi-layer wrap as directed. Add-Ons: CarboFLEX Odor Control Dressing, 4x4 (in/in) 2 x Per Week/30 Days Discharge Instructions: PLEASE APPLY OVER THE XSORB DO NOT APPLY ON SKIN Place the fibrous (non-shiny) surface of the dressing directly onto the wound for management of malodorous wounds. Electronic Signature(s) AMEAR, STROJNY ([id number]) Signed: 01/18/2021 4:48:21 PM By: Dolan Amen RN Signed: 01/18/2021 5:15:45 PM By: Worthy Keeler PA-C Entered By: Dolan Amen on 01/18/2021 09:04:08 PEARSON, PICOU ([id number]) -------------------------------------------------------------------------------- Problem List Details Patient Name: [name] Date of Service: 01/18/2021 8:15 AM Medical Record Number: [id number] Patient Account Number: [id number] Date of Birth/Sex: [birth year] (40 y.o. 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 Yes L97.321 Non-pressure chronic ulcer of left ankle limited to breakdown of skin 10/08/2019 No Yes I87.2 Venous insufficiency (chronic) (peripheral) 12/04/2016 No Yes L03.116 Cellulitis of left lower limb 05/24/2019 No Yes F17.208 Nicotine dependence, unspecified, with other nicotine-induced disorders 04/24/2020 No Yes Inactive Problems ICD-10 Code Description Active Date Inactive Date L97.213  Non-pressure chronic ulcer of right calf with necrosis of muscle 04/02/2017 04/02/2017 Resolved Problems Electronic Signature(s) Signed: 01/18/2021 8:33:55 AM By: Worthy Keeler PA-C Entered By: Worthy Keeler on  01/18/2021 08:33:54 Griggs, Hanad ([id number]) -------------------------------------------------------------------------------- Progress Note Details Patient Name: [name] Date of Service: 01/18/2021 8:15 AM Medical Record Number: [id number] Patient Account Number: [id number] Date of Birth/Sex: Mar 06, 1981 (39 y.o. M) Treating RN: Dolan Amen Primary Care Provider: [name] Other Clinician: Referring Provider: [name] Treating Provider/Extender: [name] Cliche in Treatment: 215 Subjective Chief Complaint Information obtained from Patient He is here in follow up evaluation for LLE pyoderma ulcer History of Present Illness (HPI) 12/04/16; 40 year old man who comes into the clinic today for review of a wound on the posterior left calf. He tells me that is been there for about a year. He is not a diabetic he does smoke half a pack per day. He was seen in the ER on 11/20/16 felt to have cellulitis around the wound and was given clindamycin. An x-ray did not show osteomyelitis. The patient initially tells me that he has a milk allergy that sets off a pruritic itching rash on his lower legs which she scratches incessantly and he thinks that's what may have set up the wound. He has been using various topical antibiotics and ointments without any effect. He works in a [employer] and is on his feet all day. He does not have a prior history of wounds however he does have the rash on both lower legs the right arm and the ventral aspect of his left arm. These are excoriations and clearly have had scratching however there are of macular looking areas on both legs including a substantial larger area on the right leg. This does not have an underlying open area. There is no blistering. The patient tells me that 2 years ago in [place] in response to the rash on his legs he saw a dermatologist who told him he had a condition which may be pyoderma gangrenosum although I may be putting words  into his mouth. He seemed to recognize this. On further questioning he admits to a 5 year history of quiesced. ulcerative colitis. He is not in any treatment for this. He's had no recent travel 12/11/16; the patient arrives today with his wound and roughly the same condition we've been using silver alginate this is a deep punched out wound with some surrounding erythema but no tenderness. Biopsy I did did not show confirmed pyoderma gangrenosum suggested nonspecific inflammation and vasculitis but does not provide an actual description of what was seen by the pathologist. I'm really not able to understand this We have also received information from the patient's dermatologist in [place] notes from April 2016. This was a doctor Agarwal-antal. 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 He was prescribed topical steroid high potency under occlusion which helped but at this point the patient did not have a deep punched out wound. 12/18/16; the patient's wound is larger in terms of surface area however this surface looks better and there is less depth. The surrounding erythema also is better. The patient states that the wrap we put on came off 2 days ago when he has been using his compression stockings. He we are in the process of getting a dermatology consult. 12/26/16 on evaluation today patient's left lower extremity wound shows evidence of infection with surrounding erythema noted. He has been tolerating the dressing changes but states that he has noted more discomfort.  There is a larger area of erythema surrounding the wound. No fevers, chills, nausea, or vomiting noted at this time. With that being said the wound still does have slough covering the surface. He is not allergic to any medication that he is aware of at this point. In regard to his right lower extremity he had several regions that are erythematous and pruritic he wonders if there's anything we can do to help that. 01/02/17 I  reviewed patient's wound culture which was obtained his visit last week. He was placed on doxycycline at that point. Unfortunately that does not appear to be an antibiotic that would likely help with the situation however the pseudomonas noted on culture is sensitive to Cipro. Also unfortunately patient's wound seems to have a large compared to last week's evaluation. Not severely so but there are definitely increased measurements in general. He is continuing to have discomfort as well he writes this to be a seven out of 10. In fact he would prefer me not to perform any debridement today due to the fact that he is having discomfort and considering he has an active infection on the little reluctant to do so anyway. No fevers, chills, nausea, or vomiting noted at this time. 01/08/17; patient seems dermatology on September 5. I suspect dermatology will want the slides from the biopsy I did sent to their pathologist. I'm not sure if there is a way we can expedite that. In any case the culture I did before I left on vacation 3 weeks ago showed Pseudomonas he was given 10 days of Cipro and per her description of her intake nurses is actually somewhat better this week although the wound is quite a bit bigger than I remember the last time I saw this. He still has 3 more days of Cipro 01/21/17; dermatology appointment tomorrow. He has completed the ciprofloxacin for Pseudomonas. Surface of the wound looks better however he is had some deterioration in the lesions on his right leg. Meantime the left lateral leg wound we will continue with sample 01/29/17; patient had his dermatology appointment but I can't yet see that note. He is completed his antibiotics. The wound is more superficial but considerably larger in circumferential area than when he came in. This is in his left lateral calf. He also has swollen erythematous areas with superficial wounds on the right leg and small papular areas on both arms. There  apparently areas in her his upper thighs and buttocks I did not look at those. Dermatology biopsied the right leg. Hopefully will have their input next week. 02/05/17; patient went back to see his dermatologist who told him that he had a "scratching problem" as well as staph. He is now on a 30 day course of doxycycline and I believe she gave him triamcinolone cream to the right leg areas to help with the itching [not exactly sure but probably triamcinolone]. She apparently looked at the left lateral leg wound although this was not rebiopsied and I think felt to be ultimately part of the same pathogenesis. He is using sample border foam and changing nevus himself. He now has a new open area on the right posterior leg which was his biopsy site I don't have any of the dermatology notes 02/12/17; we put the patient in compression last week with SANTYL to the wound on the left leg and the biopsy. Edema is much better and the depth of the wound is now at level of skin. Area is still the same Biopsy  site on the right lateral leg we've also been using santyl with a border foam dressing and he is changing this himself. 02/19/17; Using silver alginate started last week to both the substantial left leg wound and the biopsy site on the right wound. He is tolerating compression well. Has a an appointment with his primary M.D. tomorrow wondering about diuretics although I'm wondering if the edema problem is actually lymphedema JUANDAVID, DALLMAN ([id number]) 02/26/17; the patient has been to see his primary doctor Dr. Jerrel Ivory at [place] our primary care. She started him on Lasix 20 mg and this seems to have helped with the edema. However we are not making substantial change with the left lateral calf wound and inflammation. The biopsy site on the right leg also looks stable but not really all that different. 03/12/17; the patient has been to see vein and vascular Dr. Lucky Cowboy. He has had venous reflux studies I have not  reviewed these. I did get a call from his dermatology office. 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 They therefore recommended clobetasol and tetracycline i.e. 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 03/26/17; apparently the patient just had reflux studies not an appointment with Dr. dew. She arrives in clinic today having applied clobetasol for 2-3 weeks. He notes over the last 2-3 days excessive drainage having to change the dressing 3-4 times a day and also expanding erythema. 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 Lillia Carmel at [facility] dermatology locally who kindly saw him at my request. 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 My concern was the degree of erythema medially extending from his left leg wound which was either inflammation from pyoderma or cellulitis. I put him on Augmentin however culture of the wound showed Pseudomonas which is quinolone sensitive. I really don't believe he has cellulitis however in view of everything I will continue and give him a course of Cipro. He is also on doxycycline as an immune modulator for the pyoderma. In addition to his original wound on the left lateral leg with surrounding erythema he has a wound on the right posterior calf which was an original biopsy site done by dermatology. This was felt to represent pathergy from pyoderma gangrenosum 04/16/17;  pyoderma gangrenosum. Saw Dr. Lillia Carmel yesterday. He has been using topical antibiotics to both wound areas his original wound on the left and the biopsies/pathergy area on the right. There is definitely some improvement in the inflammation around the wound on the right although the patient states he has increasing sensitivity of the wounds. He is on prednisone 60 and doxycycline 1 as prescribed by Dr. Lillia Carmel. He is covering the topical antibiotic with gauze and putting this in his own compression stocks and changing this daily. He states that Dr. Lottie Rater did a culture of the left leg wound yesterday 05/07/17; pyoderma gangrenosum. The patient saw Dr. Lillia Carmel yesterday and has a follow-up with her in one  month. He is still using topical antibiotics to both wounds although he can't recall exactly what type. He is still on prednisone 60 mg. Dr. Lillia Carmel stated that the doxycycline could stop if we were in agreement. 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 The right medial leg was induced by biopsy/pathergy. The area on the right is essentially healed. 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 The right medial leg has closed and remains closed. He is still on prednisone 60. He tells me he missed his last dermatology appointment with Dr. Lillia Carmel but will make another appointment. He reports that her blood sugar at a recent screen in [place] was high 200's. He was 180 today. 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 I think he is going to require still much longer prednisone perhaps another 3 months before attempting to taper. In the meantime his wound is a lot better. Smaller. He is cleaning this off daily and applying topical antibiotics. When he was last in the clinic I thought  about changing to [facility] and actually put in a couple of calls to dermatology although probably not during their business hours. 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 He continues on oral prednisone. He has been using triple antibiotic ointment. There is surface debris and we will transition to [facility] and have him return in 2 weeks. He has lost 30 pounds since his last appointment with lifestyle modification. He may benefit from topical steroid cream for treatment this can be considered at a later date. 08/22/17 on evaluation today patient appears to actually be doing rather well in regard to his left lateral lower extremity ulcer. He has actually been managed by Dr. Dellia Nims most recently. Patient is currently on oral steroids at this time. This seems to have been of benefit for him. Nonetheless his last visit was actually with Leah on 08/06/17. Currently he is not utilizing any topical steroid creams although this could be of benefit as well. No fevers, chills, nausea, or vomiting noted at this time. 09/05/17 on evaluation today patient appears to be doing better in regard to his left lateral lower extremity ulcer. He has been tolerating the dressing changes without complication. He is using Santyl with good effect. Overall I'm very pleased with how things are standing at this point. Patient likewise is happy that this is doing better. 09/19/17 on evaluation today patient actually appears to be doing rather well in regard to his left lateral lower extremity ulcer. 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 He's not having any significant pain. 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 He  has been tolerating the Santyl without complication and in general I feel like he's making good progress. 10/17/17 on evaluation today patient appears to be doing very well in regard to his left lateral lower surety ulcer. He has been tolerating the dressing changes without complication. There does not appear to be any evidence of infection he's alternating the Santyl and the triple antibiotic ointment  every other day this seems to be doing well for him. 11/03/17 on evaluation today patient appears to be doing very well in regard to his left lateral lower extremity ulcer. He is been tolerating the dressing changes without complication which is good news. Fortunately there does not appear to be any evidence of infection which is also great news. Overall is doing excellent they are starting to taper down on the prednisone is down to 40 mg at this point it also started topical clobetasol for him. 11/17/17 on evaluation today patient appears to be doing well in regard to his left lateral lower surety ulcer. He's been tolerating the dressing changes without complication. He does note that he is having no pain, no excessive drainage or discharge, and overall he feels like things are going about how he would expect and hope they would. Overall he seems to have no evidence of infection at this time in my opinion which is good news. 12/04/17-He is seen in follow-up evaluation for right lateral lower extremity ulcer. He has been applying topical steroid cream. Today's measurement show slight increase in size. Over the next 2 weeks we will transition to every other day Santyl and steroid cream. He has been encouraged to monitor for changes and notify clinic with any concerns 12/15/17 on evaluation today patient's left lateral motion the ulcer and fortunately is doing worse again at this point. This just since last week to this week has close to doubled in size according to the patient. I did not seeing last week's I  do not have a visual to compare this to in our system was also down so we do not have all the charts and at this point. Nonetheless it does have me somewhat concerned in regard to the fact that again he was worried enough about it he has contact the dermatology that placed them back on the full strength, 50 mg a day of the prednisone that he was taken previous. He continues to alternate using clobetasol along with Santyl at this point. He is obviously somewhat frustrated. ARJAY, JASKIEWICZ ([id number]) 12/22/17 on evaluation today patient appears to be doing a little worse compared to last evaluation. Unfortunately the wound is a little deeper and slightly larger than the last week's evaluation. With that being said he has made some progress in regard to the irritation surrounding at this time unfortunately despite that progress that's been made he still has a significant issue going on here. 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 For that reason I discussed with him today the possibility of perform a wound culture to ensure there's nothing overtly infected. 01/06/18 on evaluation today patient's wound is larger and deeper than previously evaluated. With that being said it did appear that his wound was infected after my last evaluation with him. Subsequently I did end up prescribing a prescription for Bactrim DS which she has been taking and having no complication with. Fortunately there does not appear to be any evidence of infection at this point in time as far as anything spreading, no want to touch, and overall I feel like things are showing signs of improvement. 01/13/18 on evaluation today patient appears to be even a little larger and deeper than last time. There still muscle exposed in the base of the wound. Nonetheless he does appear to be less  erythematous I do  believe inflammation is calming down also believe the infection looks like it's probably resolved at this time based on what I'm seeing. No fevers, chills, nausea, or vomiting noted at this time. 01/30/18 on evaluation today patient actually appears to visually look better for the most part. Unfortunately those visually this looks better he does seem to potentially have what may be an abscess in the muscle that has been noted in the central portion of the wound. This is the first time that I have noted what appears to be fluctuance in the central portion of the muscle. With that being said I'm somewhat more concerned about the fact that this might indicate an abscess formation at this location. I do believe that an ultrasound would be appropriate. This is likely something we need to try to do as soon as possible. He has been switch to mupirocin ointment and he is no longer using the steroid ointment as prescribed by dermatology he sees them again next week he's been decreased from 60 to 40 mg of prednisone. 03/09/18 on evaluation today patient actually appears to be doing a little better compared to last time I saw him. There's not as much erythema surrounding the wound itself. He I did review his most recent infectious disease note which was dated 02/24/18. He saw Dr. Michel Bickers in [place]. 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 Patient is now off of antibiotics and they are continually observing this. There seems to be no change in the past two weeks in my pinion based on what the patient says and what I see today compared to what Dr. Megan Salon likely saw two weeks ago. No fevers, chills, nausea, or vomiting noted at this time. 03/23/18 on evaluation today patient's wound actually appears to be showing signs of improvement which is good news. He is currently still on the Dapsone. He is  also working on tapering the prednisone to get off of this and Dr. Lottie Rater is working with him in this regard. Nonetheless overall I feel like the wound is doing well it does appear based on the infectious disease note that I reviewed from Dr. 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 I did also review the results of his ultrasound of left lower extremity which revealed there was a dentist tissue in the base of the wound without an abscess noted. 04/06/18 on evaluation today the patient's left lateral lower extremity ulcer actually appears to be doing fairly well which is excellent news. There does not appear to be any evidence of infection at this time which is also great news. Overall he still does have a significantly large ulceration although little by little he seems to be making progress. He is down to 10 mg a day of the prednisone. 04/20/18 on evaluation today patient actually appears to be doing excellent at this time in regard to his left lower extremity ulcer. He's making signs of good progress unfortunately this is taking much longer than we would really like to see but nonetheless he is making progress. Fortunately there does not appear to be any evidence of infection at this time. No fevers, chills, nausea, or vomiting noted at this time. The patient has not been using the Santyl due to the cost he hadn't got in this  field yet. He's mainly been using the antibiotic ointment topically. Subsequently he also tells me that he really has not been scrubbing in the shower I think this would be helpful again as I told him it doesn't have to be anything too aggressive to even make it believe just enough to keep it free of some of the loose slough and biofilm on the wound surface. 05/11/18 on evaluation today patient's wound appears to be making slow but sure progress in regard to the  left lateral lower extremity ulcer. He is been tolerating the dressing changes without complication. Fortunately there does not appear to be any evidence of infection at this time. He is still just using triple antibiotic ointment along with clobetasol occasionally over the area. He never got the Santyl and really does not seem to intend to in my pinion. 06/01/18 on evaluation today patient appears to be doing a little better in regard to his left lateral lower extremity ulcer. He states that overall he does not feel like he is doing as well with the Dapsone as he did with the prednisone. Nonetheless he sees his dermatologist later today and is gonna talk to them about the possibility of going back on the prednisone. Overall again I believe that the wound would be better if you would utilize Santyl but he really does not seem to be interested in going back to the [place] at this point. He has been using triple antibiotic ointment. 06/15/18 on evaluation today patient's wound actually appears to be doing about the same at this point. Fortunately there is no signs of infection at this time. He has made slight improvements although he continues to not really want to clean the wound bed at this point. He states that he just doesn't mess with it he doesn't want to cause any problems with everything else he has going on. He has been on medication, antibiotics as prescribed by his dermatologist, for a staff infection of his lower extremities which is really drying out now and looking much better he tells me. Fortunately there is no sign of overall infection. 06/29/18 on evaluation today patient appears to be doing well in regard to his left lateral lower surety ulcer all things considering. Fortunately his staff infection seems to be greatly improved compared to previous. He has no signs of infection and this is drying up quite nicely. He is still the doxycycline for this is no longer on cental, Dapsone, or any  of the other medications. His dermatologist has recommended possibility of an infusion but right now he does not want to proceed with that. 07/13/18 on evaluation today patient appears to be doing about the same in regard to his left lateral lower surety ulcer. Fortunately there's no signs of infection at this time which is great news. Unfortunately he still builds up a significant amount of Slough/biofilm of the surface of the wound he still is not really cleaning this as he should be appropriately. Again I'm able to easily with saline and gauze remove the majority of this on the surface which if you would do this at home would likely be a dramatic improvement for him as far as getting the area to improve. Nonetheless overall I still feel like he is making progress is just very slow. I think Santyl will be of benefit for him as well. Still he has not gotten this as of this point. 07/27/18 on evaluation today patient actually appears to be doing little worse in regards of  the erythema around the periwound region of the wound he also tells me that he's been having more drainage currently compared to what he was experiencing last time I saw him. He states not quite as bad as what he had because this was infected previously but nonetheless is still appears to be doing poorly. Fortunately there is no evidence of systemic infection at this point. The patient tells me that he is not going to be able to afford the Santyl. He is still waiting to hear about the infusion therapy with his dermatologist. Apparently she wants an updated colonoscopy first. ADVITH, MARTINE ([id number]) 08/10/18 on evaluation today patient appears to be doing better in regard to his left lateral lower extremity ulcer. Fortunately he is showing signs of improvement in this regard he's actually been approved for Remicade infusion's as well although this has not been scheduled as of yet. Fortunately there's no signs of active infection at  this time in regard to the wound although he is having some issues with infection of the right lower extremity is been seen as dermatologist for this. Fortunately they are definitely still working with him trying to keep things under control. 09/07/18 on evaluation today patient is actually doing rather well in regard to his left lateral lower extremity ulcer. He notes these actually having some hair grow back on his extremity which is something he has not seen in years. He also tells me that the pain is really not giving them any trouble at this time which is also good news overall she is very pleased with the progress he's using a combination of the mupirocin along with the probate is all mixed. 09/21/18 on evaluation today patient actually appears to be doing fairly well all things considered in regard to his looks from the ulcer. He's been tolerating the dressing changes without complication. Fortunately there's no signs of active infection at this time which is good news he is still on all antibiotics or prevention of the staff infection. He has been on prednisone for time although he states it is gonna contact his dermatologist and see if she put them on a short course due to some irritation that he has going on currently. Fortunately there's no evidence of any overall worsening this is going very slow I think cental would be something that would be helpful for him although he states that $50 for tube is quite expensive. He therefore is not willing to get that at this point. 10/06/18 on evaluation today patient actually appears to be doing decently well in regard to his left lateral leg ulcer. He's been tolerating the dressing changes without complication. Fortunately there's no signs of active infection at this time. Overall I'm actually rather pleased with the progress he's making although it's slow he doesn't show any signs of infection and he does seem to be making some improvement. I do  believe that he may need a switch up and dressings to try to help this to heal more appropriately and quickly. 10/19/18 on evaluation today patient actually appears to be doing better in regard to his left lateral lower extremity ulcer. This is shown signs of having much less Slough buildup at this point due to the fact he has been using the [employer]. Obviously this is very good news. The overall size of the wound is not dramatically smaller but again the appearance is. 11/02/18 on evaluation today patient actually appears to be doing quite well in regard to his lower [place] ulcer. A  lot of the skin around the ulcer is actually somewhat irritating at this point this seems to be more due to the dressing causing irritation from the adhesive that anything else. Fortunately there is no signs of active infection at this time. 11/24/18 on evaluation today patient appears to be doing a little worse in regard to his overall appearance of his lower extremity ulcer. There's more erythema and warmth around the wound unfortunately. He is currently on doxycycline which he has been on for some time. With that being said I'm not sure that seems to be helping with what appears to possibly be an acute cellulitis with regard to his left lower extremity ulcer. No fevers, chills, nausea, or vomiting noted at this time. 12/08/18 on evaluation today patient's wounds actually appears to be doing significantly better compared to his last evaluation. He has been using Santyl along with alternating tripling about appointment as well as the steroid cream seems to be doing quite well and the wound is showing signs of improvement which is excellent news. Fortunately there's no evidence of infection and in fact his culture came back negative with only normal skin flora noted. 12/21/2018 upon evaluation today patient actually appears to be doing excellent with regard to his ulcer. This is actually the best that I have seen it since have  been helping to take care of him. It is both smaller as well as less slough noted on the surface of the wound and seems to be showing signs of good improvement with new skin growing from the edges. He has been using just the triamcinolone he does wonder if he can get a refill of that ointment today. 01/04/2019 upon evaluation today patient actually appears to be doing well with regard to his left lateral lower extremity ulcer. With that being said it does not appear to be that he is doing quite as well as last time as far as progression is concerned. There does not appear to be any signs of infection or significant irritation which is good news. With that being said I do believe that he may benefit from switching to a collagen based dressing based on how clean The wound appears. 01/18/2019 on evaluation today patient actually appears to be doing well with regard to his wound on the left lower extremity. He is not made a lot of progress compared to where we were previous but nonetheless does seem to be doing okay at this time which is good news. There is no signs of active infection which is also good news. My only concern currently is I do wish we can get him into utilizing the collagen dressing his insurance would not pay for the supplies that we ordered although it appears that he may be able to order this through his supply company that he typically utilizes. This is [employer]. Nonetheless he did try to order it during the office visit today and it appears this did go through. We will see if he can get that it is a different brand but nonetheless he has collagen and I do think will be beneficial. 02/01/2019 on evaluation today patient actually appears to be doing a little worse today in regard to the overall size of his wounds. Fortunately there is no signs of active infection at this time. That is visually. Nonetheless when this is happened before it was due to infection. For that reason were somewhat  concerned about that this time as well. 02/08/2019 on evaluation today patient unfortunately appears to  be doing slightly worse with regard to his wound upon evaluation today. Is measuring a little deeper and a little larger unfortunately. I am not really sure exactly what is causing this to enlarge he actually did see his dermatologist she is going to see about initiating Humira for him. Subsequently she also did do steroid injections into the wound itself in the periphery. Nonetheless still nonetheless he seems to be getting a little bit larger he is gone back to just using the steroid cream topically which I think is appropriate. I would say hold off on the collagen for the time being is definitely a good thing to do. Based on the culture results which we finally did get the final result back regarding it shows staph as the bacteria noted again that can be a normal skin bacteria based on the fact however he is having increased drainage and worsening of the wound measurement wise I would go ahead and place him on an antibiotic today I do believe for this. 02/15/2019 on evaluation today patient actually appears to be doing somewhat better in regard to his ulcer. There is no signs of worsening at this time I did review his culture results which showed evidence of Staphylococcus aureus but not MRSA. Again this could just be more related to the normal skin bacteria although he states the drainage has slowed down quite a bit he may have had a mild infection not just colonization. And was much smaller and then since around10/04/2019 on evaluation today patient appears to be doing unfortunately worse as far as the size of the wound. I really feel like that this is steadily getting larger again it had been doing excellent right at the beginning of September we have seen a steady increase in the area of the wound it is almost 2-1/2 times the size it was on September 1. Obviously this is a bad trend this is  not wanting to see. For that reason we went back to using just the topical triamcinolone cream which does seem to help with inflammation. I checked him for bacteria by way of culture and nothing showed positive there. I am considering giving him a short course of a tapering steroid Jacinto Keil, Taylen ([id number]) today to see if that is can be beneficial for him. The patient is in agreement with giving that a try. 03/08/2019 on evaluation today patient appears to be doing very well in comparison to last evaluation with regard to his lower extremity ulcer. This is showing signs of less inflammation and actually measuring slightly smaller compared to last time every other week over the past month and a half he has been measuring larger larger larger. Nonetheless I do believe that the issue has been inflammation the prednisone does seem to have been beneficial for him which is good news. No fevers, chills, nausea, vomiting, or diarrhea. 03/22/2019 on evaluation today patient appears to be doing about the same with regard to his leg ulcer. He has been tolerating the dressing changes without complication. With that being said the wound seems to be mostly arrested at its current size but really is not making any progress except for when we prescribed the prednisone. He did show some signs of dropping as far as the overall size of the wound during that interval week. Nonetheless this is something he is not on long-term at this point and unfortunately I think he is getting need either this or else the Humira which his dermatologist has discussed try to  get approval for. With that being said he will be seeing his dermatologist on the 11th of this month that is November. 04/19/2019 on evaluation today patient appears to be doing really about the same the wound is measuring slightly larger compared to last time I saw him. He has not been into the office since November 2 due to the fact that he unfortunately  had Covid as that his entire family. He tells me that it was rough but they did pull-through and he seems to be doing much better. Fortunately there is no signs of active infection at this time. No fevers, chills, nausea, vomiting, or diarrhea. 05/10/2019 on evaluation today patient unfortunately appears to be doing significantly worse as compared to last time I saw him. He does tell me that he has had his first dose of Humira and actually is scheduled to get the next one in the upcoming week. With that being said he tells me also that in the past several days he has been having a lot of issues with green drainage she showed me a picture this is more blue-green in color. He is also been having issues with increased sloughy buildup and the wound does appear to be larger today. Obviously this is not the direction that we want everything to take based on the starting of his Humira. Nonetheless I think this is definitely a result of likely infection and to be honest I think this is probably Pseudomonas causing the infection based on what I am seeing. 05/24/2019 on evaluation today patient unfortunately appears to be doing significantly worse compared to his prior evaluation with me 2 weeks ago. I did review his culture results which showed that he does have Staph aureus as well as Pseudomonas noted on the culture. Nonetheless the Levaquin that I prescribed for him does not appear to have been appropriate and in fact he tells me he is no longer experiencing the green drainage and discharge that he had at the last visit. Fortunately there is no signs of active infection at this time which is good news although the wound has significantly worsened it in fact is much deeper than it was previous. We have been utilizing up to this point triamcinolone ointment as the prescription topical of choice but at this time I really feel like that the wound is getting need to be packed in order to appropriately manage this  due to the deeper nature of the wound. Therefore something along the lines of an alginate dressing may be more appropriate. 05/31/2019 upon inspection today patient's wound actually showed signs of doing poorly at this point. Unfortunately he just does not seem to be making any good progress despite what we have tried. He actually did go ahead and pick up the Cipro and start taking that as he was noticing more green drainage he had previously completed the Levaquin that I prescribed for him as well. Nonetheless he missed his appointment for the seventh last week on Wednesday with the wound care center and [facility] where his dermatologist referred him. Obviously I do think a second opinion would be helpful at this point especially in light of the fact that the patient seems to be doing so poorly despite the fact that we have tried everything that I really know how at this point. The only thing that ever seems to have helped him in the past is when he was on high doses of continual steroids that did seem to make a difference  for him. 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 06/07/2019 upon evaluation today patient unfortunately appears to be doing worse yet again with regard to his wound. In fact I am starting to question whether or not he may have a fluid pocket in the muscle at this point based on the bulging and the soft appearance to the central portion of the muscle area. There is not anything draining from the muscle itself at this time which is good news but nonetheless the wound is expanding. I am not really seeing any results of the Humira as far as overall wound progression based on what I am seeing at this point. The patient has been referred for second opinion with regard to his wound to the [facility] wound care center by his  dermatologist which I definitely am not in opposition to. Unfortunately we tried multiple dressings in the past including collagen, alginate, and at one point even Hydrofera Blue. With that being said he is never really used it for any significant amount of time due to the fact that he often complains of pain associated with these dressings and then will go back to either using the Santyl which she has done intermittently or more frequently the triamcinolone. He is also using his own compression stockings. We have wrapped him in the past but again that was something else that he really was not a big fan of. 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 He seemed to do much better when he was on the steroids although honestly I understand that the steroids are not necessarily the best medication to be on long-term obviously 06/14/2019 on evaluation today patient appears to be doing actually a little bit better with regard to the overall appearance with his leg. Unfortunately he does continue to have issues with what appears to be some fluid underneath the muscle although he did see the wound specialty center at [facility] last week their main goals were to see about infusion therapy in place of the Humira as they feel like that is not quite strong enough. They also recommended that we continue with the treatment otherwise as we are they felt like that was appropriate and they are okay with him continuing to follow-up here with [place] in that regard. With that being said they are also sending him to the vein specialist there to see about vein stripping and if that would be of benefit for him.  Subsequently they also did not really address whether or not an ultrasound of the muscle area to see if there is anything that needs to be addressed here would be appropriate or not. For that reason I discussed this with him last week I think we may proceed down that road at this point. 06/21/2019 upon evaluation today patient's wound actually appears to be doing slightly better compared to previous evaluations. I do believe that he has made a difference with regard to the progression here with the use  of oral steroids. Again in the past has been the only thing that is really calm things down. He does tell me that from [facility] is gotten a good news from there that there are no further vein stripping that is necessary at this point. I do not have that available for review today although the patient did relay this to me. He also did obtain and have the ultrasound of the wound completed which I did sign off on today. It does appear that there is no fluid collection under the muscle this is likely then just edematous tissue in general. That is also good news. Overall I still believe the inflammation is the main issue here. He did inquire about the possibility of a wound VAC again with the muscle protruding like it is I am not really sure whether the wound VAC is necessarily ideal or not. That is something we will have to consider although I do believe he may need compression wrapping to try to help with edema control which could potentially be of benefit. 06/28/2019 on evaluation today patient appears to be doing slightly better measurement wise although this is not terribly smaller he least seems to be trending towards that direction. With that being said he still seems to have purulent drainage noted in the wound bed at this time. He has been on Levaquin followed by Cipro over the past month. Unfortunately he still seems to have some issues with active infection at this time. I did perform a culture last week  in order to evaluate and see if indeed there was still anything going on. Subsequently the culture did come back Bulnes, Jermine ([id number]) showing Pseudomonas which is consistent with the drainage has been having which is blue-green in color. He also has had an odor that again was somewhat consistent with Pseudomonas as well. Long story short it appears that the culture showed an intermediate finding with regard to how well the Cipro will work for the Pseudomonas infection. Subsequently being that he does not seem to be clearing up and at best what we are doing is just keeping this at [place] I think he may need to see infectious disease to discuss IV antibiotic options. 07/05/2019 upon evaluation today patient appears to be doing okay in regard to his leg ulcer. He has been tolerating the dressing changes at this point without complication. Fortunately there is no signs of active infection at this time which is good news. No fevers, chills, nausea, vomiting, or diarrhea. With that being said he does have an appointment with infectious disease tomorrow and his primary care on Wednesday. Again the reason for the infectious disease referral was due to the fact that he did not seem to be fully resolving with the use of oral antibiotics and therefore we were thinking that IV antibiotic therapy may be necessary secondary to the fact that there was an intermediate finding for how effective the Cipro may be. Nonetheless again he has been having a lot of purulent and even green drainage. Fortunately right now that seems to have calmed down over the past week with the reinitiation of the oral antibiotic. Nonetheless we will see what Dr. Megan Salon has to say. 07/12/2019 upon evaluation today patient appears to be doing about the same at this point in regard to his left lower extremity ulcer. Fortunately there is no signs of active infection at this time which is good news I do believe the Levaquin has been beneficial I  did review Dr. Hale Bogus  note and to be honest I agree that the patient's leg does appear to be doing better currently. What we found in the past as he does not seem to really completely resolve he will stop the antibiotic and then subsequently things will revert back to having issues with blue-green drainage, increased pain, and overall worsening in general. Obviously that is the reason I sent him back to infectious disease. 07/19/2019 upon evaluation today patient appears to be doing roughly the same in size there is really no dramatic improvement. He has started back on the Levaquin at this point and though he seems to be doing okay he did still have a lot of blue/green drainage noted on evaluation today unfortunately. I think that this is still indicative more likely of a Pseudomonas infection as previously noted and again he does see Dr. Megan Salon in just a couple of days. I do not know that were really able to effectively clear this with just oral antibiotics alone based on what I am seeing currently. Nonetheless we are still continue to try to manage as best we can with regard to the patient and his wound. I do think the wrap was helpful in decreasing the edema which is excellent news. No fevers, chills, nausea, vomiting, or diarrhea. 07/26/2019 upon evaluation today patient appears to be doing slightly better with regard to the overall appearance of the muscle there is no dark discoloration centrally. Fortunately there is no signs of active infection at this time. No fevers, chills, nausea, vomiting, or diarrhea. Patient's wound bed currently the patient did have an appointment with Dr. Megan Salon at [facility] last week. With that being said Dr. Megan Salon the patient states was still somewhat hesitant about put him on any IV antibiotics he wanted [place] to repeat cultures today and then see where things go going forward. He does look like Dr. Megan Salon because of some improvement the patient did  have with the Levaquin wanted [place] to see about repeating cultures. If it indeed grows the Pseudomonas again then he recommended a possibility of considering a PICC line placement and IV antibiotic therapy. He plans to see the patient back in 1 to 2 weeks. 08/02/2019 upon evaluation today patient appears to be doing poorly with regard to his left lower extremity. We did get the results of his culture back it shows that he is still showing evidence of Pseudomonas which is consistent with the purulent/blue-green drainage that he has currently. Subsequently the culture also shows that he now is showing resistance to the oral fluoroquinolones which is unfortunate as that was really the only thing to treat the infection prior. I do believe that he is looking like this is going require IV antibiotic therapy to get this under control. Fortunately there is no signs of systemic infection at this time which is good news. The patient does see Dr. Megan Salon tomorrow. 08/09/2019 upon evaluation today patient appears to be doing better with regard to his left lower extremity ulcer in regard to the overall appearance. He is currently on IV antibiotic therapy. As ordered by Dr. Megan Salon. Currently the patient is on ceftazidime which she is going to take for the next 2 weeks and then follow-up for 4 to 5-week appointment with Dr. Megan Salon. The patient started this this past Friday symptoms have not for a total of 3 days currently in full. 08/16/2019 upon evaluation today patient's wound actually does show muscle in the base of the wound but in general does appear to be much  better as far as the overall evidence of infection is concerned. In fact I feel like this is for the most part cleared up he still on the IV antibiotics he has not completed the full course yet but I think he is doing much better which is excellent news. 08/23/2019 upon evaluation today patient appears to be doing about the same with regard to his wound at  this point. He tells me that he still has pain unfortunately. Fortunately there is no evidence of systemic infection at this time which is great news. There is significant muscle protrusion. 09/13/19 upon evaluation today patient appears to be doing about the same in regard to his leg unfortunately. He still has a lot of drainage coming from the ulceration there is still muscle exposed. With that being said the patient's last wound culture still showed an intermediate finding with regard to the Pseudomonas he still having the bluish/green drainage as well. Overall I do not know that the wound has completely cleared of infection at this point. Fortunately there is no signs of active infection systemically at this point which is good news. 09/20/2019 upon evaluation today patient's wound actually appears to be doing about the same based on what I am seeing currently. I do not see any signs of systemic infection he still does have evidence of some local infection and drainage. He did see Dr. Megan Salon last week and Dr. Megan Salon states that he probably does need a different IV antibiotic although he does not want to put him on this until the patient begins the Remicade infusion which is actually scheduled for about 10 days out from today on 13 May. Following that time Dr. Megan Salon is good to see him back and then will evaluate the feasibility of starting him on the IV antibiotic therapy once again at that point. I do not disagree with this plan I do believe as Dr. Megan Salon stated in his note that I reviewed today that the patient's issue is multifactorial with the pyoderma being 1 aspect of this that were hoping the Remicade will be helpful for her. In the meantime I think the gentamicin is, helping to keep things under decent okay control in regard to the ulcer. 09/27/2019 upon evaluation today patient appears to be doing about the same with regard to his wound still there is a lot of muscle exposure though  he does have some hyper granulation tissue noted around the edge and actually some granulation tissue starting to form over the muscle which is actually good news. Fortunately there is no evidence of active infection which is also good news. His pain is less at this point. 5/21; this is a patient I have not seen in a long time. 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 He has a large wound on the left lateral leg with protruding muscle. He comes in the clinic today showing the same area on his left medial ankle. He says there is been a spot there for some time although we have not previously defined this. Today he has a clearly defined area with slight amount of skin breakdown surrounded by raised areas with a purplish hue in color. This is not painful he says it is irritated. 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 He still has muscle protruding from the lateral portion of his left leg but fortunately the new area that they were  concerned about at his last visit does not appear to have opened at this point. He is currently on Remicade infusions and seems to be doing better in my opinion in fact the wound itself seems to be overall much better. The purplish discoloration that he did have seems to have resolved and I think that is a good sign that hopefully the Remicade is doing its job. He does Guadarrama, Dorsie ([id number]) have some biofilm noted over the surface of the wound. 11/01/2019 on evaluation today patient's wound actually appears to be doing excellent at this time. Fortunately there is no evidence of active infection and overall I feel like he is making great progress. The Remicade seems to be due excellent job in my opinion. 11/08/19 evaluation today vision actually appears to be doing quite well with regard to his weight ulcer. He's been  tolerating dressing changes without complication. Fortunately there is no evidence of infection. No fevers, chills, nausea, or vomiting noted at this time. Overall states that is having more itching than pain which is actually a good sign in my opinion. 12/13/2019 upon evaluation today patient appears to be doing well today with regard to his wound. He has been tolerating the dressing changes without complication. Fortunately there is no sign of active infection at this time. No fevers, chills, nausea, vomiting, or diarrhea. Overall I feel like the infusion therapy has been very beneficial for him. 01/06/2020 on evaluation today patient appears to be doing well with regard to his wound. This is measuring smaller and actually looks to be doing better. Fortunately there is no signs of active infection at this point. No fevers, chills, nausea, vomiting, or diarrhea. With that being said he does still have the blue-green drainage but this does not seem to be causing any significant issues currently. He has been using the gentamicin that does seem to be keeping things under decent control at this point. He goes later this morning for his next infusion therapy for the pyoderma which seems to also be very beneficial. 02/07/2020 on evaluation today patient appears to be doing about the same in regard to his wounds currently. Fortunately there is no signs of active infection systemically he does still have evidence of local infection still using gentamicin. He also is showing some signs of improvement albeit slowly I do feel like we are making some progress here. 02/21/2020 upon evaluation today patient appears to be making some signs of improvement the wound is measuring a little bit smaller which is great news and overall I am very pleased with where he stands currently. He is going to be having infusion therapy treatment on the 15th of this month. Fortunately there is no signs of active infection at this  time. 03/13/2020 I do believe patient's wound is actually showing some signs of improvement here which is great news. He has continue with the infusion therapy through rheumatology/dermatology at [facility]. That does seem to be beneficial. I still think he gets as much benefit from this as he did from the prednisone initially but nonetheless obviously this is less harsh on his body that the prednisone as far as they are concerned. 03/31/2020 on evaluation today patient's wound actually showing signs of some pretty good improvement in regard to the overall appearance of the wound bed. There is still muscle exposed though he does have some epithelial growth around the edges of the wound. Fortunately there is no signs of active infection at this time. No fevers, chills, nausea, vomiting, or  diarrhea. 04/24/2020 upon evaluation today patient appears to be doing about the same in regard to his leg ulcer. He has been tolerating the dressing changes without complication. Fortunately there is no signs of active infection at this time. No fevers, chills, nausea, vomiting, or diarrhea. With that being said he still has a lot of irritation from the bandaging around the edges of the wound. We did discuss today the possibility of a referral to plastic surgery. 05/22/2020 on evaluation today patient appears to be doing well with regard to his wounds all things considered. He has not been able to get the Chantix apparently there is a recall nurse that I was unaware of put out by [employer] involuntarily. Nonetheless for now I am and I have to do some research into what may be the best option for him to help with quitting in regard to smoking and we discussed that today. 06/26/2020 upon evaluation today patient appears to be doing well with regard to his wound from the standpoint of infection I do not see any signs of infection at this point. With that being said unfortunately he is still continuing to have issues with muscle  exposure and again he is not having a whole lot of new skin growth unfortunately. There does not appear to be any signs of active infection at this time. No fevers, chills, nausea, vomiting, or diarrhea. 07/10/2020 upon evaluation today patient appears to be doing a little bit more poorly currently compared to where he was previous. I am concerned currently about an active infection that may be getting worse especially in light of the increased size and tenderness of the wound bed. No fevers, chills, nausea, vomiting, or diarrhea. 07/24/2020 upon evaluation today patient appears to be doing poorly in regard to his leg ulcer. He has been tolerating the dressing changes without complication but unfortunately is having a lot of discomfort. 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 Subsequently I think he is going require possibly IV antibiotics to get this under control. I am very concerned about the severity of his infection and the amount of discomfort he is having. 07/31/2020 upon evaluation today patient appears to be doing about the same in regard to his leg wound. He did see Dr. Megan Salon and Dr. Megan Salon is actually going to start him on IV antibiotics. He goes for the PICC line tomorrow. With that being said there do not have that run for 2 weeks and then see how things are doing and depending on how he is progressing they may extend that a little longer. Nonetheless I am glad this is getting ready to be in place and definitely feel it may help the patient. In the meantime is been using mainly triamcinolone to the wound bed has an anti-inflammatory. 08/07/2020 on evaluation today patient appears to be doing well with regard to his wound compared even last week. In the interim he has gotten the PICC line placed and overall this seems to be doing excellent. There does not appear to be any evidence of infection which is great  news systemically although locally of course has had the infection this appears to be improving with the use of the antibiotics. 08/14/2020 upon evaluation today patient's wound actually showing signs of excellent improvement. Overall the irritation has significantly improved the drainage is back down to more of a normal level and his pain is really pretty much nonexistent compared to what it was. Obviously I think  that this is significantly improved secondary to the IV antibiotic therapy which has made all the difference in the world. Again he had a resistant form of Pseudomonas for which oral antibiotics just was not cutting it. Nonetheless I do think that still we need to consider the possibility of a surgical closure for this wound is been open so long and to be honest with muscle exposed I think this can be very hard to get this to close outside of this although definitely were still working to try to do what we can in that regard. 08/21/2020 upon evaluation today patient appears to be doing very well with regard to his wounds on the left lateral lower extremity/calf area. Fortunately there does not appear to be signs of active infection which is great news and overall very pleased with where things stand today. He is actually wrapping up his treatment with IV antibiotics tomorrow. After that we will see where things go from there. 08/28/2020 upon evaluation today patient appears to be doing decently well with regard to his leg ulcer. There does not appear to be any signs of Revolorio, Marven ([id number]) active infection which is great news and overall very pleased with where things stand today. No fevers, chills, nausea, vomiting, or diarrhea. 09/18/2020 upon evaluation today patient appears to be doing well with regard to his infection which I feel like is better. Unfortunately he is not doing as well with regard to the overall size of the wound which is not nearly as good at this point. I feel like that  he may be having an issue here with the pyoderma being somewhat out of control. I think that he may benefit from potentially going back and talking to the dermatologist about what to do from the pyoderma standpoint. I am not certain if the infusions are helping nearly as much is what the prednisone did in the past. 10/02/2020 upon evaluation today patient appears to be doing well with regard to his leg ulcer. He did go to the Psychiatric nurse. Unfortunately they feel like there is a 10% chance that most that he would be able to heal and that the skin graft would take. Obviously this has led him to not be able to go down that path as far as treatment is concerned. Nonetheless he does seem to be doing a little bit better with the prednisone that I gave him last time. I think that he may need to discuss with dermatology the possibility of long-term prednisone as that seems to be what is most helpful for him to be perfectly honest. I am not sure the Remicade is really doing the job. 10/17/2020 upon evaluation today patient appears to be doing a little better in regard to his wound. In fact the case has been since we did the prednisone on May 2 for him that we have noticed a little bit of improvement each time we have seen a size wise as well as appearance wise as well as pain wise. I think the prednisone has had a greater effect then the infusion therapy has to be perfectly honest. With that being said the patient also feels significantly better compared to what he was previous. All of this is good news but nonetheless I am still concerned about the fact that again we are really not set up to long-term manage him as far as prednisone is concerned. Obviously there are things that you need to be watched I completely understand the risk of prednisone usage  as well. 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 With all that being said I do believe that we can give him  another round of the prednisone which she is requesting today because of the improvement that he seen since we did that first round. 10/30/2020 upon evaluation today patient's wound actually is showing signs of doing quite well. There does not appear to be any evidence of infection which is great news and overall very pleased with where things stand today. No fevers, chills, nausea, vomiting, or diarrhea. He tells me that the prednisone still has seem to have helped he wonders if we can extend that for just a little bit longer. He did not have the appointment with a dermatologist although he did have an infusion appointment last Friday. That was at [facility]. With that being said he tells me he could not do both that as well as the appointment with the physician on the same day therefore that is can have to be rescheduled. I really want to see if there is anything they feel like that could be done differently to try to help this out as I am not really certain that the infusions are helping significantly here. 11/13/2020 upon evaluation today patient unfortunately appears to be doing somewhat poorly in regard to his wound I feel like this is actually worsening from the standpoint of the pyoderma spreading. I still feel like that he may need something different as far as trying to manage this going forward. Again we did the prednisone unfortunately his blood sugars are not doing so well because of this. Nonetheless I believe that the patient likely needs to try topical steroid. We have done triamcinolone for a while I think going with something stronger such as clobetasol could be beneficial again this is not something I do lightly I discussed this with the patient that again this does not normally put underneath an occlusive dressing. Nonetheless I think a thin film as such could help with some of the stronger anti-inflammatory effects. We discussed this today. He would like to try to give this a trial for the  next couple weeks. I definitely think that is something that we can do. Evaluate7/03/2021 and today patient's wound bed actually showed signs of doing really about the same. There was a little expansion of the size of the wound and that leading edge that we done looking out although the clobetasol does seem to have slowed this down a bit in my opinion. There is just 1 small area that still seems to be progressing based on what I see. Nonetheless I am concerned about the fact this does not seem to be improving if anything seems to be doing a little bit worse. I do not know that the infusions are really helping him much as next infusion is August 5 his appointment with dermatology is July 25. Either way I really think that we need to have a conversation potentially about this and I am actually going to see if I can talk with Dr. Lillia Carmel in order to see where things stand as well. 12/11/2020 upon evaluation today patient appears to be doing worse in regard to his leg ulcer. Unfortunately I just do not think this is making the progress that I would like to see at this point. Honestly he does have an appointment with dermatology and this is in 2 days. I am wondering what they may have to offer to  help with this. Right now what I am seeing is that he is continuing to show signs of worsening little by little. Obviously that is not great at all. Is the exact opposite of what we are looking for. 12/18/2020 upon evaluation today patient appears to be doing a little better in regard to his wound. The dermatologist actually did do some steroid injections into the wound which does seem to have been beneficial in my opinion. That was on the 25th already this looks a little better to me than last time I saw him. With that being said we did do a culture and this did show that he has Staph aureus noted in abundance in the wound. With that being said I do think that getting him on an oral antibiotic would be appropriate  as well. Also think we can compression wrap and this will make a difference as well. 12/28/2020 upon evaluation today patient's wound is actually showing signs of doing much better. I do believe the compression wrap is helping he has a lot of drainage but to be honest I think that the compression is helping to some degree in this regard as well as not draining through which is also good news. No fevers, chills, nausea, vomiting, or diarrhea. 01/04/2021 upon evaluation today patient appears to be doing well with regard to his wound. Overall things seem to be doing quite well. He did have a little bit of reaction to the CarboFlex Sorbact he will be using that any longer. With that being said he is controlled as far as the drainage is concerned overall and seems to be doing quite well. I do not see any signs of active infection at this time which is great news. No fevers, chills, nausea, vomiting, or diarrhea. 01/11/2021 upon evaluation today patient appears to be doing well with regard to his wounds. He has been tolerating the dressing changes without complication. Fortunately there does not appear to be any signs of active infection at this time which is great news. Overall I am extremely pleased with where we stand currently. No fevers, chills, nausea, vomiting, or diarrhea. Where using clobetasol in the wound bed he has a lot of new skin growth which is awesome as well. 01/18/2021 upon evaluation today patient appears to be doing very well in regard to his leg ulcer. He has been tolerating the dressing changes without complication. Fortunately there does not appear to be any signs of active infection which is great news. In general I think that he is making excellent progress Sanks, Terence ([id number]) Objective Constitutional Well-nourished and well-hydrated in no acute distress. Vitals Time Taken: 8:27 AM, Height: 71 in, Weight: 338 lbs, BMI: 47.1, Temperature: 98.8 F, Pulse: 83 bpm, Respiratory  Rate: 18 breaths/min, Blood Pressure: 129/81 mmHg. Respiratory normal breathing without difficulty. Psychiatric this patient is able to make decisions and demonstrates good insight into disease process. Alert and Oriented x 3. pleasant and cooperative. General Notes: Upon inspection patient's wound bed actually showed signs of good granulation epithelization at this point. In fact this appears to be doing excellent. I am extremely pleased with how things appear and I think that the patient is making wonderful progress. Integumentary (Hair, Skin) Wound #1 status is Open. Original cause of wound was Gradually Appeared. The date acquired was: 11/18/2015. The wound has been in treatment 215 weeks. The wound is located on the Left,Lateral Lower Leg. The wound measures 6cm length x 6cm width x 0.7cm depth; 28.274cm^2 area and 19.792cm^3 volume.  There is muscle and Fat Layer (Subcutaneous Tissue) exposed. There is no tunneling noted, however, there is undermining starting at 6:00 and ending at 8:00 with a maximum distance of 0.7cm. There is a large amount of purulent drainage noted. There is large (67-100%) red, hyper - granulation within the wound bed. There is a small (1-33%) amount of necrotic tissue within the wound bed including Adherent Slough. 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 There was a Four Layer Compression Therapy Procedure with a pre-treatment ABI of 1.2 by Dolan Amen, RN. Post procedure Diagnosis Wound #1: Same as Pre-Procedure Plan Follow-up Appointments: Return Appointment  in 1 week. - Next Thursday Nurse Visit as needed - Monday Bathing/ Shower/ Hygiene: Clean wound with Normal Saline or wound cleanser. Edema Control - Lymphedema / Segmental Compressive Device / Other: Optional: One layer of unna paste to top of compression wrap (to act as an anchor). 4 Layer Compression System Lymphedema. Medications-Please add to medication list.: P.O. Antibiotics SAVEON, PLANT ([id number]) WOUND #1: - Lower Leg Wound Laterality: Left, Lateral Cleanser: Soap and Water 2 x Per Week/30 Days Discharge Instructions: Gently cleanse wound with antibacterial soap, rinse and pat dry prior to dressing wounds Peri-Wound Care: Desitin Maximum Strength Ointment 4 (oz) 2 x Per Week/30 Days Peri-Wound Care: Triamcinolone Acetonide Cream, 0.1%, 15 (g) tube 2 x Per Week/30 Days Discharge Instructions: Apply on posterior leg Topical: Clobetasol Propionate ointment 0.05%, 60 (g) tube 2 x Per Week/30 Days Discharge Instructions: Apply in undermining area and reddened area on posterior leg Primary Dressing: Calcium Alginate Dressing, 6x6 (in/in) 2 x Per Week/30 Days Discharge Instructions: Apply to wound bed Secondary Dressing: Xtrasorb Large 6x9 (in/in) 2 x Per Week/30 Days Discharge Instructions: Apply to wound as directed. Do not cut. Compression Wrap: Medichoice 4 layer Compression System, 35-40 mmHG (Generic) 2 x Per Week/30 Days Discharge Instructions: Apply multi-layer wrap as directed. Add-Ons: CarboFLEX Odor Control Dressing, 4x4 (in/in) 2 x Per Week/30 Days Discharge Instructions: PLEASE APPLY OVER THE XSORB DO NOT APPLY ON SKIN Place the fibrous (non-shiny) surface of the dressing directly onto the wound for management of malodorous wounds. 1. Would recommend currently that we going to continue with wound care measures as before and the patient is in agreement with plan. When change I would make is we will use a little bit of triamcinolone mixed with lotion around the perimeter  of the wound just to help with some of the dry skin and itching that is been given him a little bit of trouble here. Otherwise we will keep everything else the same. 2. We will get a continue with his clobetasol over the surface of the wound and subsequently he will also continue with the alginate followed by XtraSorb and were using the CarboFlex just to contain the smell and odor. We will see patient back for reevaluation in 1 week here in the clinic. If anything worsens or changes patient will contact our office for additional recommendations. Electronic Signature(s) Signed: 01/18/2021 9:16:52 AM By: Worthy Keeler PA-C Entered By: Worthy Keeler on 01/18/2021 09:16:51 Mattos, [name] ([id number]) -------------------------------------------------------------------------------- SuperBill Details Patient Name: [name] Date of Service: 01/18/2021 Medical Record Number: [id number] Patient  Account Number: [id number] Date of Birth/Sex: 1981/03/20 (39 y.o. 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 I87.2 Venous insufficiency (chronic) (peripheral) L03.116 Cellulitis of left lower limb F17.208 Nicotine dependence, unspecified, with other nicotine-induced disorders Physician Procedures CPT4 Code: 3335456 Description: 99214 - WC PHYS LEVEL 4 - EST PT Modifier: Quantity: 1 CPT4 Code: Description: ICD-10 Diagnosis Description L97.222 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

## 2021-01-23 ENCOUNTER — Other Ambulatory Visit: Payer: Self-pay

## 2021-01-23 DIAGNOSIS — L080 Pyoderma: Secondary | ICD-10-CM | POA: Diagnosis not present

## 2021-01-23 NOTE — Progress Notes (Addendum)
[name], [name] ([id number]) Visit Report for 01/23/2021 Arrival Information Details Patient Name: [name], [name] Date of Service: 01/23/2021 11:30 AM Medical Record Number: [id number] Patient Account Number: [id number] Date of Birth/Sex: 06/01/1980 (40 y.o. M) Treating RN: Dolan Amen Primary Care Helina Hullum: [name] Other Clinician: Referring Elizer Bostic: [name] Treating Hatim Homann/Extender: [name] Cliche in Treatment: 215 Visit Information History Since Last Visit Pain Present Now: No Patient Arrived: Ambulatory Arrival Time: 12:20 Accompanied By: self Transfer Assistance: None Patient Identification Verified: Yes Secondary Verification Process Completed: Yes Patient Requires Transmission-Based No Precautions: Patient Has Alerts: Yes Patient Alerts: Patient has reaction to silver dressings. Electronic Signature(s) Signed: 01/23/2021 1:17:13 PM By: Dolan Amen RN Entered By: Dolan Amen on 01/23/2021 13:17:13 [name] ([id number]) -------------------------------------------------------------------------------- Clinic Level of Care Assessment Details Patient Name: [name], [name] Date of Service: 01/23/2021 11:30 AM Medical Record Number: [id number] Patient Account Number: [id number] Date of Birth/Sex: 04-21-1981 (40 y.o. M) Treating RN: Dolan Amen Primary Care Lysandra Loughmiller: [name] Other Clinician: Referring Nicola Quesnell: [name] Treating Sura Canul/Extender: [name] Cliche in Treatment: 215 Clinic Level of Care Assessment Items TOOL 1 Quantity Score []  - Use when EandM and Procedure is performed on INITIAL visit 0 ASSESSMENTS - Nursing Assessment / Reassessment []  - General Physical Exam (combine w/ comprehensive assessment (listed just below) when performed on new 0 pt. evals) []  - 0 Comprehensive Assessment (HX, ROS, Risk Assessments, Wounds Hx, etc.) ASSESSMENTS - Wound and Skin Assessment / Reassessment []  - Dermatologic / Skin  Assessment (not related to wound area) 0 ASSESSMENTS - Ostomy and/or Continence Assessment and Care []  - Incontinence Assessment and Management 0 []  - 0 Ostomy Care Assessment and Management (repouching, etc.) PROCESS - Coordination of Care []  - Simple Patient / Family Education for ongoing care 0 []  - 0 Complex (extensive) Patient / Family Education for ongoing care []  - 0 Staff obtains Programmer, systems, Records, Test Results / Process Orders []  - 0 Staff telephones HHA, Nursing Homes / Clarify orders / etc []  - 0 Routine Transfer to another Facility (non-emergent condition) []  - 0 Routine Hospital Admission (non-emergent condition) []  - 0 New Admissions / Biomedical engineer / Ordering NPWT, Apligraf, etc. []  - 0 Emergency Hospital Admission (emergent condition) PROCESS - Special Needs []  - Pediatric / Minor Patient Management 0 []  - 0 Isolation Patient Management []  - 0 Hearing / Language / Visual special needs []  - 0 Assessment of Community assistance (transportation, D/C planning, etc.) []  - 0 Additional assistance / Altered mentation []  - 0 Support Surface(s) Assessment (bed, cushion, seat, etc.) INTERVENTIONS - Miscellaneous []  - External ear exam 0 []  - 0 Patient Transfer (multiple staff / Civil Service fast streamer / Similar devices) []  - 0 Simple Staple / Suture removal (25 or less) []  - 0 Complex Staple / Suture removal (26 or more) []  - 0 Hypo/Hyperglycemic Management (do not check if billed separately) []  - 0 Ankle / Brachial Index (ABI) - do not check if billed separately Has the patient been seen at the hospital within the last three years: Yes Total Score: 0 Level Of Care: ____ [name] ([id number]) Electronic Signature(s) Signed: 01/23/2021 4:06:52 PM By: Dolan Amen RN Entered By: Dolan Amen on 01/23/2021 13:18:15 [name] ([id number]) -------------------------------------------------------------------------------- Compression Therapy  Details Patient Name: [name] Date of Service: 01/23/2021 11:30 AM Medical Record Number: [id number] Patient Account Number: [id number] Date of Birth/Sex: [birth year] (40 y.o. M) Treating RN: Dolan Amen Primary Care Alacia Rehmann: [name] Other Clinician: Referring Ciaira Natividad: [name] Treating  Lameeka Schleifer/Extender: Jeri Cos Weeks in Treatment: 215 Compression Therapy Performed for Wound Assessment: Wound #1 Left,Lateral Lower Leg Performed By: Cora Daniels, RN Compression Type: Four Layer Pre Treatment ABI: 1.2 Electronic Signature(s) Signed: 01/23/2021 1:17:41 PM By: Dolan Amen RN Entered By: Dolan Amen on 01/23/2021 13:17:40 [name] ([id number]) -------------------------------------------------------------------------------- Encounter Discharge Information Details Patient Name: [name] Date of Service: 01/23/2021 11:30 AM Medical Record Number: [id number] Patient Account Number: [id number] Date of Birth/Sex: 13-Oct-1980 (40 y.o. M) Treating RN: Dolan Amen Primary Care Nickola Lenig: [name] Other Clinician: Referring Jamaris Theard: [name] Treating Arleatha Philipps/Extender: [name] Cliche in Treatment: 215 Encounter Discharge Information Items Discharge Condition: Stable Ambulatory Status: Ambulatory Discharge Destination: Home Transportation: Private Auto Accompanied By: self Schedule Follow-up Appointment: Yes Clinical Summary of Care: Electronic Signature(s) Signed: 01/23/2021 1:18:09 PM By: Dolan Amen RN Entered By: Dolan Amen on 01/23/2021 13:18:09 [name] ([id number]) -------------------------------------------------------------------------------- Wound Assessment Details Patient Name: [name] Date of Service: 01/23/2021 11:30 AM Medical Record Number: [id number] Patient Account Number: [id number] Date of Birth/Sex: [birth year] (40 y.o. M) Treating RN: Dolan Amen Primary Care Blaire Palomino: [name] Other Clinician: Referring Christorpher Hisaw: [name] Treating Princess Karnes/Extender: [name] Cliche in Treatment: 215 Wound Status Wound Number: 1 Primary Etiology: Pyoderma Wound Location: Left, Lateral Lower Leg Wound Status: Open Wounding Event: Gradually Appeared Date Acquired: 11/18/2015 Weeks Of Treatment: 215 Clustered Wound: No Wound Measurements Length: (cm) 6 Width: (cm) 6 Depth: (cm) 0.7 Area: (cm) 28.274 Volume: (cm) 19.792 % Reduction in Area: -476% % Reduction in Volume: -404% Wound Description Classification: Full Thickness With Exposed Support Structures Exudate Amount: Large Exudate Type: Purulent Exudate Color: yellow, brown, green Treatment Notes Wound #1 (Lower Leg) Wound Laterality: Left, Lateral Cleanser Soap and Water Discharge Instruction: Gently cleanse wound with antibacterial soap, rinse and pat dry prior to dressing wounds Peri-Wound Care Desitin Maximum Strength Ointment 4 (oz) Triamcinolone Acetonide Cream, 0.1%, 15 (g) tube Discharge Instruction: Apply on posterior leg Topical Clobetasol Propionate ointment 0.05%, 60 (g) tube Discharge Instruction: Apply in undermining area and reddened area on posterior leg Primary Dressing Calcium Alginate Dressing, 6x6 (in/in) Discharge Instruction: Apply to wound bed Secondary Dressing Xtrasorb Large 6x9 (in/in) Discharge Instruction: Apply to wound as directed. Do not cut. Secured With Compression Wrap Medichoice 4 layer Compression System, 35-40 mmHG Discharge Instruction: Apply multi-layer wrap as directed. Compression Stockings Add-Ons Engen, Chancey ([id number]) CarboFLEX Odor Control Dressing, 4x4 (in/in) Discharge Instruction: [place] DO NOT APPLY ON SKIN Place the fibrous (non-shiny) surface of the dressing directly onto the wound for management of malodorous wounds. Electronic Signature(s) Signed: 01/23/2021 4:06:52 PM By: Dolan Amen RN Entered By:  Dolan Amen on 01/23/2021 13:17:24

## 2021-01-25 ENCOUNTER — Other Ambulatory Visit: Payer: Self-pay

## 2021-01-25 ENCOUNTER — Encounter: Payer: 59 | Admitting: Physician Assistant

## 2021-01-25 DIAGNOSIS — L080 Pyoderma: Secondary | ICD-10-CM | POA: Diagnosis not present

## 2021-01-25 NOTE — Progress Notes (Signed)
EDRAS, WILFORD ([id number]) Visit Report for 01/25/2021 Arrival Information Details Patient Name: [name], [name] Date of Service: 01/25/2021 8:00 AM Medical Record Number: [id number] Patient Account Number: [id number] Date of Birth/Sex: [birth year] (40 y.o. M) Treating RN: Donnamarie Poag Primary Care Leverne Amrhein: [name] Other Clinician: Referring Verlia Kaney: [name] Treating Aijalon Kirtz/Extender: [name] Cliche in Treatment: 37 Visit Information History Since Last Visit Added or deleted any medications: No Patient Arrived: Ambulatory Had a fall or experienced change in No Arrival Time: 08:14 activities of daily living that may affect Accompanied By: self risk of falls: Transfer Assistance: None Hospitalized since last visit: No Patient Identification Verified: Yes Has Dressing in Place as Prescribed: Yes Secondary Verification Process Completed: Yes Pain Present Now: No Patient Requires Transmission-Based No Precautions: Patient Has Alerts: Yes Patient Alerts: Patient has reaction to silver dressings. Electronic Signature(s) Signed: 01/25/2021 10:32:15 AM By: Donnamarie Poag Entered By: Donnamarie Poag on 01/25/2021 08:14:31 [name] ([id number]) -------------------------------------------------------------------------------- Clinic Level of Care Assessment Details Patient Name: [name] Date of Service: 01/25/2021 8:00 AM Medical Record Number: [id number] Patient Account Number: [id number] Date of Birth/Sex: 13-Jan-1981 (40 y.o. M) Treating RN: Donnamarie Poag Primary Care Kawika Bischoff: [name] Other Clinician: Referring Braelin Brosch: [name] Treating Aideliz Garmany/Extender: [name] Cliche in Treatment: 216 Clinic Level of Care Assessment Items TOOL 1 Quantity Score []  - Use when EandM and Procedure is performed on INITIAL visit 0 ASSESSMENTS - Nursing Assessment / Reassessment []  - General Physical Exam (combine w/ comprehensive assessment (listed just below) when  performed on new 0 pt. evals) []  - 0 Comprehensive Assessment (HX, ROS, Risk Assessments, Wounds Hx, etc.) ASSESSMENTS - Wound and Skin Assessment / Reassessment []  - Dermatologic / Skin Assessment (not related to wound area) 0 ASSESSMENTS - Ostomy and/or Continence Assessment and Care []  - Incontinence Assessment and Management 0 []  - 0 Ostomy Care Assessment and Management (repouching, etc.) PROCESS - Coordination of Care []  - Simple Patient / Family Education for ongoing care 0 []  - 0 Complex (extensive) Patient / Family Education for ongoing care []  - 0 Staff obtains Programmer, systems, Records, Test Results / Process Orders []  - 0 Staff telephones HHA, Nursing Homes / Clarify orders / etc []  - 0 Routine Transfer to another Facility (non-emergent condition) []  - 0 Routine Hospital Admission (non-emergent condition) []  - 0 New Admissions / Biomedical engineer / Ordering NPWT, Apligraf, etc. []  - 0 Emergency Hospital Admission (emergent condition) PROCESS - Special Needs []  - Pediatric / Minor Patient Management 0 []  - 0 Isolation Patient Management []  - 0 Hearing / Language / Visual special needs []  - 0 Assessment of Community assistance (transportation, D/C planning, etc.) []  - 0 Additional assistance / Altered mentation []  - 0 Support Surface(s) Assessment (bed, cushion, seat, etc.) INTERVENTIONS - Miscellaneous []  - External ear exam 0 []  - 0 Patient Transfer (multiple staff / Civil Service fast streamer / Similar devices) []  - 0 Simple Staple / Suture removal (25 or less) []  - 0 Complex Staple / Suture removal (26 or more) []  - 0 Hypo/Hyperglycemic Management (do not check if billed separately) []  - 0 Ankle / Brachial Index (ABI) - do not check if billed separately Has the patient been seen at the hospital within the last three years: Yes Total Score: 0 Level Of Care: ____ [name] ([id number]) Electronic Signature(s) Signed: 01/25/2021 10:32:15 AM By: Donnamarie Poag Entered  By: Donnamarie Poag on 01/25/2021 08:33:06 [name], [name] ([id number]) -------------------------------------------------------------------------------- Compression Therapy Details Patient Name: [name] Date of Service: 01/25/2021 8:00 AM Medical  Record Number: [id number] Patient Account Number: [id number] Date of Birth/Sex: 10-12-1980 (40 y.o. M) Treating RN: Donnamarie Poag Primary Care Verdie Barrows: [name] Other Clinician: Referring Kimbria Camposano: [name] Treating Lanier Felty/Extender: [name] Cliche in Treatment: 216 Compression Therapy Performed for Wound Assessment: Wound #1 Left,Lateral Lower Leg Performed By: Junius Argyle, RN Compression Type: Four Layer Post Procedure Diagnosis Same as Pre-procedure Electronic Signature(s) Signed: 01/25/2021 10:32:15 AM By: Donnamarie Poag Entered By: Donnamarie Poag on 01/25/2021 08:32:57 [name] ([id number]) -------------------------------------------------------------------------------- Encounter Discharge Information Details Patient Name: [name] Date of Service: 01/25/2021 8:00 AM Medical Record Number: [id number] Patient Account Number: [id number] Date of Birth/Sex: 10-21-1980 (40 y.o. M) Treating RN: Donnamarie Poag Primary Care Jatavian Calica: [name] Other Clinician: Referring Tyrel Lex: [name] Treating Nakota Elsen/Extender: [name] Cliche in Treatment: 216 Encounter Discharge Information Items Discharge Condition: Stable Ambulatory Status: Ambulatory Discharge Destination: Home Transportation: Private Auto Accompanied By: self Schedule Follow-up Appointment: Yes Clinical Summary of Care: Electronic Signature(s) Signed: 01/25/2021 10:32:15 AM By: Donnamarie Poag Entered By: Donnamarie Poag on 01/25/2021 08:52:01 [name] ([id number]) -------------------------------------------------------------------------------- Lower Extremity Assessment Details Patient Name: [name] Date of Service: 01/25/2021 8:00  AM Medical Record Number: [id number] Patient Account Number: [id number] Date of Birth/Sex: [birth year] (40 y.o. M) Treating RN: Donnamarie Poag Primary Care Zebedee Segundo: [name] Other Clinician: Referring Arsen Mangione: [name] Treating Xavi Tomasik/Extender: [name] Cliche in Treatment: 216 Edema Assessment Assessed: [Left: Yes] [Right: No] [Left: Edema] [Right: :] Calf Left: Right: Point of Measurement: 33 cm From Medial Instep 48 cm Ankle Left: Right: Point of Measurement: 12 cm From Medial Instep 29 cm Electronic Signature(s) Signed: 01/25/2021 10:32:15 AM By: Donnamarie Poag Entered By: Donnamarie Poag on 01/25/2021 08:25:42 [name], [name] ([id number]) -------------------------------------------------------------------------------- Multi Wound Chart Details Patient Name: [name] Date of Service: 01/25/2021 8:00 AM Medical Record Number: [id number] Patient Account Number: [id number] Date of Birth/Sex: Dec 07, 1980 (40 y.o. M) Treating RN: Donnamarie Poag Primary Care Oaklynn Stierwalt: [name] Other Clinician: Referring Charma Mocarski: [name] Treating Carless Slatten/Extender: [name] Cliche in Treatment: 216 Vital Signs Height(in): 71 Pulse(bpm): 90 Weight(lbs): 338 Blood Pressure(mmHg): 133/79 Body Mass Index(BMI): 47 Temperature(F): 98.8 Respiratory Rate(breaths/min): 16 Photos: [N/A:N/A] Wound Location: Left, Lateral Lower Leg N/A N/A Wounding Event: Gradually Appeared N/A N/A Primary Etiology: Pyoderma N/A N/A Comorbid History: Sleep Apnea, Hypertension, Colitis N/A N/A Date Acquired: 11/18/2015 N/A N/A Weeks of Treatment: 216 N/A N/A Wound Status: Open N/A N/A Measurements L x W x D (cm) 6x6x0.6 N/A N/A Area (cm) : 28.274 N/A N/A Volume (cm) : 16.965 N/A N/A % Reduction in Area: -476.00% N/A N/A % Reduction in Volume: -332.00% N/A N/A Starting Position 1 (o'clock): 2 Ending Position 1 (o'clock): 3 Maximum Distance 1 (cm): 0.4 Starting Position 2 (o'clock):  6 Ending Position 2 (o'clock): 7 Maximum Distance 2 (cm): 0.5 Undermining: Yes N/A N/A Classification: Full Thickness With Exposed N/A N/A Support Structures Exudate Amount: Large N/A N/A Exudate Type: Serosanguineous N/A N/A Exudate Color: red, brown N/A N/A Granulation Amount: Large (67-100%) N/A N/A Granulation Quality: Red, Pink N/A N/A Necrotic Amount: Small (1-33%) N/A N/A Exposed Structures: Fat Layer (Subcutaneous Tissue): N/A N/A Yes Muscle: Yes Fascia: No Tendon: No Joint: No Bone: No Treatment Notes Electronic Signature(s) Signed: 01/25/2021 10:32:15 AM By: Gwendel Hanson, [name] ([id number]) Entered By: Donnamarie Poag on 01/25/2021 08:31:13 [name] ([id number]) -------------------------------------------------------------------------------- Multi-Disciplinary Care Plan Details Patient Name: [name] Date of Service: 01/25/2021 8:00 AM Medical Record Number: [id number] Patient Account Number: [id number] Date of Birth/Sex: [birth year] (40 y.o. M) Treating RN: Lyndel Safe,  Joy Primary Care Metzli Pollick: [name] Other Clinician: Referring Jewell Haught: [name] Treating Sashay Felling/Extender: [name] Cliche in Treatment: 216 [place] reviewed with physician Active Inactive Electronic Signature(s) Signed: 01/25/2021 10:32:15 AM By: Donnamarie Poag Entered By: Donnamarie Poag on 01/25/2021 08:31:01 [name] ([id number]) -------------------------------------------------------------------------------- Pain Assessment Details Patient Name: [name] Date of Service: 01/25/2021 8:00 AM Medical Record Number: [id number] Patient Account Number: [id number] Date of Birth/Sex: [birth year] (40 y.o. M) Treating RN: Donnamarie Poag Primary Care Edmund Rick: [name] Other Clinician: Referring Micalah Cabezas: [name] Treating Rhyland Hinderliter/Extender: [name] Cliche in Treatment: 216 Active Problems Location of Pain Severity and Description of  Pain Patient Has Paino No Site Locations Rate the pain. Current Pain Level: 0 Pain Management and Medication Current Pain Management: Electronic Signature(s) Signed: 01/25/2021 10:32:15 AM By: Donnamarie Poag Entered By: Donnamarie Poag on 01/25/2021 08:15:36 [name], [name] ([id number]) -------------------------------------------------------------------------------- Patient/Caregiver Education Details Patient Name: [name] Date of Service: 01/25/2021 8:00 AM Medical Record Number: [id number] Patient Account Number: [id number] Date of Birth/Gender: 07-03-1980 (40 y.o. M) Treating RN: Donnamarie Poag Primary Care Physician: [name] Other Clinician: Referring Physician: [name] Treating Physician/Extender: [name] Cliche in Treatment: 216 Education Assessment Education Provided To: Patient Education Topics Provided Wound/Skin Impairment: Electronic Signature(s) Signed: 01/25/2021 10:32:15 AM By: Donnamarie Poag Entered By: Donnamarie Poag on 01/25/2021 08:33:24 [name] ([id number]) -------------------------------------------------------------------------------- Wound Assessment Details Patient Name: [name] Date of Service: 01/25/2021 8:00 AM Medical Record Number: [id number] Patient Account Number: [id number] Date of Birth/Sex: 30-Aug-1980 (40 y.o. M) Treating RN: Donnamarie Poag Primary Care Camisha Srey: [name] Other Clinician: Referring Maleigh Bagot: [name] Treating Mamye Bolds/Extender: [name] Cliche in Treatment: 216 Wound Status Wound Number: 1 Primary Etiology: Pyoderma Wound Location: Left, Lateral Lower Leg Wound Status: Open Wounding Event: Gradually Appeared Comorbid History: Sleep Apnea, Hypertension, Colitis Date Acquired: 11/18/2015 Weeks Of Treatment: 216 Clustered Wound: No Photos Wound Measurements Length: (cm) 6 Width: (cm) 6 Depth: (cm) 0.6 Area: (cm) 28.274 Volume: (cm) 16.965 % Reduction in Area: -476% % Reduction in Volume:  -332% Undermining: Yes Location 1 Starting Position (o'clock): 2 Ending Position (o'clock): 3 Maximum Distance: (cm) 0.4 Location 2 Starting Position (o'clock): 6 Ending Position (o'clock): 7 Maximum Distance: (cm) 0.5 Wound Description Classification: Full Thickness With Exposed Support Structures Exudate Amount: Large Exudate Type: Serosanguineous Exudate Color: red, brown Foul Odor After Cleansing: No Slough/Fibrino Yes Wound Bed Granulation Amount: Large (67-100%) Exposed Structure Granulation Quality: Red, Pink Fascia Exposed: No Necrotic Amount: Small (1-33%) Fat Layer (Subcutaneous Tissue) Exposed: Yes Necrotic Quality: Adherent Slough Tendon Exposed: No Muscle Exposed: Yes Necrosis of Muscle: Yes Joint Exposed: No Bone Exposed: No Treatment Notes [name], [name] ([id number]) Wound #1 (Lower Leg) Wound Laterality: Left, Lateral Cleanser Soap and Water Discharge Instruction: Gently cleanse wound with antibacterial soap, rinse and pat dry prior to dressing wounds Peri-Wound Care Desitin Maximum Strength Ointment 4 (oz) Triamcinolone Acetonide Cream, 0.1%, 15 (g) tube Discharge Instruction: Apply on posterior leg Topical Clobetasol Propionate ointment 0.05%, 60 (g) tube Discharge Instruction: Apply in undermining area and reddened area on posterior leg Primary Dressing Calcium Alginate Dressing, 6x6 (in/in) Discharge Instruction: Apply to wound bed Secondary Dressing Xtrasorb Large 6x9 (in/in) Discharge Instruction: Apply to wound as directed. Do not cut. Secured With Compression Wrap Medichoice 4 layer Compression System, 35-40 mmHG Discharge Instruction: Apply multi-layer wrap as directed. Compression Stockings Add-Ons CarboFLEX Odor Control Dressing, 4x4 (in/in) Discharge Instruction: PLEASE APPLY OVER THE XSORB DO NOT APPLY ON SKIN Place the fibrous (non-shiny) surface of the  dressing directly onto the wound for management of malodorous wounds. Electronic  Signature(s) Signed: 01/25/2021 10:32:15 AM By: Donnamarie Poag Entered By: Donnamarie Poag on 01/25/2021 08:24:35 [name] ([id number]) -------------------------------------------------------------------------------- [place] Details Patient Name: [name] Date of Service: 01/25/2021 8:00 AM Medical Record Number: [id number] Patient Account Number: [id number] Date of Birth/Sex: 1980/09/15 (40 y.o. M) Treating RN: Donnamarie Poag Primary Care Samarie Pinder: [name] Other Clinician: Referring Monae Topping: [name] Treating Myleka Moncure/Extender: [name] Cliche in Treatment: 216 Vital Signs Time Taken: 08:14 Temperature (F): 98.8 Height (in): 71 Pulse (bpm): 90 Weight (lbs): 338 Respiratory Rate (breaths/min): 16 Body Mass Index (BMI): 47.1 Blood Pressure (mmHg): 133/79 Reference Range: 80 - 120 mg / dl Electronic Signature(s) Signed: 01/25/2021 10:32:15 AM By: Donnamarie Poag Entered ByDonnamarie Poag on 01/25/2021 08:15:18

## 2021-01-25 NOTE — Progress Notes (Addendum)
[name], [name] ([id number]) Visit Report for 01/25/2021 Chief Complaint Document Details Patient Name: [name], [name] Date of Service: 01/25/2021 8:00 AM Medical Record Number: [id number] Patient Account Number: [id number] Date of Birth/Sex: [birth year] (40 y.o. 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 M) Treating RN: Donnamarie Poag Primary Care Provider: [name] Other Clinician: Referring Provider: [name] Treating Provider/Extender: [name] Cliche in Treatment: 216 History of Present Illness HPI Description: 12/04/16; 40 year old man who comes into the clinic today for review of a wound on the posterior left calf. He tells me that is been there for about a year. He is not a diabetic he does smoke half a pack per day. He was seen in the ER on 11/20/16 felt to have cellulitis around the wound and was given clindamycin. An x-ray did not show osteomyelitis. The patient initially tells me that he has a milk allergy that sets off a pruritic itching rash on his lower legs which she scratches incessantly and he thinks that's what may have set up the wound. He has been using various topical antibiotics and ointments without any effect. He works in a [employer] and is on his feet all day. He does not have a prior history of  wounds however he does have the rash on both lower legs the right arm and the ventral aspect of his left arm. These are excoriations and clearly have had scratching however there are of macular looking areas on both legs including a substantial larger area on the right leg. This does not have an underlying open area. There is no blistering. The patient tells me that 2 years ago in [place] in response to the rash on his legs he saw a dermatologist who told him he had a condition which may be pyoderma gangrenosum although I may be putting words into his mouth. He seemed to recognize this. On further questioning he admits to a 5 year history of quiesced. ulcerative colitis. He is not in any treatment for this. He's had no recent travel 12/11/16; the patient arrives today with his wound and roughly the same condition we've been using silver alginate this is a deep punched out wound with some surrounding erythema but no tenderness. Biopsy I did did not show confirmed pyoderma gangrenosum suggested nonspecific inflammation and vasculitis but does not provide an actual description of what was seen by the pathologist. I'm really not able to understand this We have also received information from the patient's dermatologist in [place] notes from April 2016. This was a doctor Agarwal-antal. 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 He was prescribed topical steroid high potency under occlusion which helped but at this point the patient did not have a deep punched out wound. 12/18/16; the patient's wound is larger in terms of surface area however this surface looks better and there is less depth. The surrounding erythema also is better. The patient states that the wrap we put on came off 2 days ago when he has been using his compression stockings.  He we are in the process of getting a dermatology consult. 12/26/16 on evaluation today patient's left lower extremity wound shows evidence of infection with  surrounding erythema noted. He has been tolerating the dressing changes but states that he has noted more discomfort. There is a larger area of erythema surrounding the wound. No fevers, chills, nausea, or vomiting noted at this time. With that being said the wound still does have slough covering the surface. He is not allergic to any medication that he is aware of at this point. In regard to his right lower extremity he had several regions that are erythematous and pruritic he wonders if there's anything we can do to help that. 01/02/17 I reviewed patient's wound culture which was obtained his visit last week. He was placed on doxycycline at that point. Unfortunately that does not appear to be an antibiotic that would likely help with the situation however the pseudomonas noted on culture is sensitive to Cipro. Also unfortunately patient's wound seems to have a large compared to last week's evaluation. Not severely so but there are definitely increased measurements in general. He is continuing to have discomfort as well he writes this to be a seven out of 10. In fact he would prefer me not to perform any debridement today due to the fact that he is having discomfort and considering he has an active infection on the little reluctant to do so anyway. No fevers, chills, nausea, or vomiting noted at this time. 01/08/17; patient seems dermatology on September 5. I suspect dermatology will want the slides from the biopsy I did sent to their pathologist. I'm not sure if there is a way we can expedite that. In any case the culture I did before I left on vacation 3 weeks ago showed Pseudomonas he was given 10 days of Cipro and per her description of her intake nurses is actually somewhat better this week although the wound is quite a bit bigger than I remember the last time I saw this. He still has 3 more days of Cipro 01/21/17; dermatology appointment tomorrow. He has completed the ciprofloxacin for Pseudomonas.  Surface of the wound looks better however he is had some deterioration in the lesions on his right leg. Meantime the left lateral leg wound we will continue with sample 01/29/17; patient had his dermatology appointment but I can't yet see that note. He is completed his antibiotics. The wound is more superficial but considerably larger in circumferential area than when he came in. This is in his left lateral calf. He also has swollen erythematous areas with superficial wounds on the right leg and small papular areas on both arms. There apparently areas in her his upper thighs and buttocks I did not look at those. Dermatology biopsied the right leg. Hopefully will have their input next week. 02/05/17; patient went back to see his dermatologist who told him that he had a "scratching problem" as well as staph. He is now on a 30 day course of doxycycline and I believe she gave him triamcinolone cream to the right leg areas to help with the itching [not exactly sure but probably triamcinolone]. She apparently looked at the left lateral leg wound although this was not rebiopsied and I think felt to be ultimately part of the same pathogenesis. He is using sample border foam and changing nevus himself. He now has a new open area on the right posterior leg which was his biopsy site I don't have any of the dermatology notes  02/12/17; we put the patient in compression last week with SANTYL to the wound on the left leg and the biopsy. Edema is much better and the depth of the wound is now at level of skin. Area is still the same oBiopsy site on the right lateral leg we've also been using santyl with a border foam dressing and he is changing this himself. 02/19/17; Using silver alginate started last week to both the substantial left leg wound and the biopsy site on the right wound. He is tolerating compression well. Has a an appointment with his primary M.D. tomorrow wondering about diuretics although I'm wondering if  the edema problem is actually lymphedema 02/26/17; the patient has been to see his primary doctor Dr. Jerrel Ivory at [facility] our primary care. She started him on Lasix 20 mg and this seems to have helped with the edema. However we are not making substantial change with the left lateral calf wound and inflammation. The biopsy site on the right leg also looks stable but not really all that different. 03/12/17; the patient has been to see vein and vascular Dr. Lucky Cowboy. He has had venous reflux studies I have not reviewed these. I did get a call from his dermatology office. They felt that he might have pathergy based on their biopsy on his right leg which led them to look at the slides of [name], [name] ([id number]) the biopsy I did on the left leg and they wonder whether this represents pyoderma gangrenosum which was the original supposition in a man with ulcerative colitis albeit inactive for many years. They therefore recommended clobetasol and tetracycline i.e. 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 03/26/17; apparently the patient just had reflux studies not an appointment with Dr. dew. She arrives in clinic today having applied clobetasol for 2-3 weeks. He notes over the last 2-3 days excessive drainage having to change the dressing 3-4 times a day and also expanding erythema. 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 Lillia Carmel at [facility] dermatology locally who kindly saw him at my request. 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 My concern was the degree of erythema medially extending from his left leg wound which was either inflammation from pyoderma or cellulitis. I  put him on Augmentin however culture of the wound showed Pseudomonas which is quinolone sensitive. I really don't believe he has cellulitis however in view of everything I will continue and give him a course of Cipro. He is also on doxycycline as an immune modulator for the pyoderma. In addition to his original wound on the left lateral leg with surrounding erythema he has a wound on the right posterior calf which was an original biopsy site done by dermatology. 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 Saw Dr. Lillia Carmel yesterday. He has been using topical antibiotics to both wound areas his original wound on the left and the biopsies/pathergy area on the right. There is definitely some improvement in the inflammation around the wound on the right although the patient states he has increasing sensitivity of the wounds. He is on prednisone 60 and doxycycline 1 as prescribed by Dr. Lillia Carmel. He is covering the topical antibiotic with gauze  and putting this in his own compression stocks and changing this daily. He states that Dr. Lottie Rater did a culture of the left leg wound yesterday 05/07/17; pyoderma gangrenosum. The patient saw Dr. Lillia Carmel yesterday and has a follow-up with her in one month. He is still using topical antibiotics to both wounds although he can't recall exactly what type. He is still on prednisone 60 mg. Dr. Lillia Carmel stated that the doxycycline could stop if we were in agreement. 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 The right medial leg was induced by biopsy/pathergy. The area on the right is essentially healed. 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 The right medial leg has closed and remains closed. He is still on  prednisone 60. oHe tells me he missed his last dermatology appointment with Dr. Lillia Carmel but will make another appointment. He reports that her blood sugar at a recent screen in [place] was high 200's. He was 180 today. 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 I think he is going to require still much longer prednisone perhaps another 3 months before attempting to taper. In the meantime his wound is a lot better. Smaller. He is cleaning this off daily and applying topical antibiotics. When he was last in the clinic I thought about changing to [facility] and actually put in a couple of calls to dermatology although probably not during their business hours. 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 He continues on oral prednisone. He has been using triple antibiotic ointment. There is surface debris and we will transition to [facility] and have him return in 2 weeks. He has lost 30 pounds since his last appointment with lifestyle modification. He may benefit from topical steroid cream for treatment this can be considered at a later date. 08/22/17 on evaluation today patient appears to actually be doing rather well in regard to his left lateral lower extremity ulcer. He has actually been managed by Dr. Dellia Nims most recently. Patient is currently on oral steroids at this time. This seems to have been of benefit for him. Nonetheless his last visit was actually with Leah on 08/06/17. Currently he is not utilizing any topical steroid creams although this could be of benefit as well. No fevers, chills, nausea, or vomiting noted at this time. 09/05/17 on evaluation today patient appears to be doing better in regard to his left lateral lower extremity ulcer. He has been tolerating the dressing changes without complication. He is using Santyl with good effect. Overall I'm very  pleased with how things are standing at this point. Patient likewise is happy that this is doing better. 09/19/17 on evaluation today patient actually appears to be doing rather well in regard to his left lateral lower extremity ulcer. 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 He's not having any significant pain. 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 He has been tolerating the Santyl without complication and in general I feel like he's making good progress. 10/17/17 on evaluation today patient  appears to be doing very well in regard to his left lateral lower surety ulcer. He has been tolerating the dressing changes without complication. There does not appear to be any evidence of infection he's alternating the Santyl and the triple antibiotic ointment every other day this seems to be doing well for him. 11/03/17 on evaluation today patient appears to be doing very well in regard to his left lateral lower extremity ulcer. He is been tolerating the dressing changes without complication which is good news. Fortunately there does not appear to be any evidence of infection which is also great news. Overall is doing excellent they are starting to taper down on the prednisone is down to 40 mg at this point it also started topical clobetasol for him. 11/17/17 on evaluation today patient appears to be doing well in regard to his left lateral lower surety ulcer. He's been tolerating the dressing changes without complication. He does note that he is having no pain, no excessive drainage or discharge, and overall he feels like things are going about how he would expect and hope they would. Overall he seems to have no evidence of infection at this time in my opinion which is good news. 12/04/17-He is seen in follow-up evaluation  for right lateral lower extremity ulcer. He has been applying topical steroid cream. Today's measurement show slight increase in size. Over the next 2 weeks we will transition to every other day Santyl and steroid cream. He has been encouraged to monitor for changes and notify clinic with any concerns 12/15/17 on evaluation today patient's left lateral motion the ulcer and fortunately is doing worse again at this point. This just since last week to this week has close to doubled in size according to the patient. I did not seeing last week's I do not have a visual to compare this to in our system was also down so we do not have all the charts and at this point. Nonetheless it does have me somewhat concerned in regard to the fact that again he was worried enough about it he has contact the dermatology that placed them back on the full strength, 50 mg a day of the prednisone that he was taken previous. He continues to alternate using clobetasol along with Santyl at this point. He is obviously somewhat frustrated. 12/22/17 on evaluation today patient appears to be doing a little worse compared to last evaluation. Unfortunately the wound is a little deeper and slightly larger than the last week's evaluation. With that being said he has made some progress in regard to the irritation surrounding at this time unfortunately despite that progress that's been made he still has a significant issue going on here. 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 [name], [name] ([id number]) For that reason I discussed with him today the possibility of perform a wound culture to ensure there's nothing overtly infected. 01/06/18 on evaluation today patient's wound is larger and deeper than previously evaluated. With that being said it did appear that his wound was infected after  my last evaluation with him. Subsequently I did end up prescribing a prescription for Bactrim DS which she has been taking and having no complication with. Fortunately there does not appear to be any evidence of infection at this point in time as far as anything spreading, no want to touch, and overall I feel like  things are showing signs of improvement. 01/13/18 on evaluation today patient appears to be even a little larger and deeper than last time. There still muscle exposed in the base of the wound. Nonetheless he does appear to be less erythematous I do believe inflammation is calming down also believe the infection looks like it's probably resolved at this time based on what I'm seeing. No fevers, chills, nausea, or vomiting noted at this time. 01/30/18 on evaluation today patient actually appears to visually look better for the most part. Unfortunately those visually this looks better he does seem to potentially have what may be an abscess in the muscle that has been noted in the central portion of the wound. This is the first time that I have noted what appears to be fluctuance in the central portion of the muscle. With that being said I'm somewhat more concerned about the fact that this might indicate an abscess formation at this location. I do believe that an ultrasound would be appropriate. This is likely something we need to try to do as soon as possible. He has been switch to mupirocin ointment and he is no longer using the steroid ointment as prescribed by dermatology he sees them again next week he's been decreased from 60 to 40 mg of prednisone. 03/09/18 on evaluation today patient actually appears to be doing a little better compared to last time I saw him. There's not as much erythema surrounding the wound itself. He I did review his most recent infectious disease note which was dated 02/24/18. He saw Dr. Michel Bickers in [place]. 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 Patient is now off of antibiotics and they are continually observing this. There seems to be no change in the past two weeks in my pinion based on what the patient says and what I see today compared to what Dr. Megan Salon likely saw two weeks ago. No fevers, chills, nausea, or vomiting noted at this time. 03/23/18 on evaluation today patient's wound actually appears to be showing signs of improvement which is good news. He is currently still on the Dapsone. He is also working on tapering the prednisone to get off of this and Dr. Lottie Rater is working with him in this regard. Nonetheless overall I feel like the wound is doing well it does appear based on the infectious disease note that I reviewed from Dr. 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 I did also review the results of his ultrasound of left lower extremity which revealed there was a dentist tissue in the base of the wound without an abscess noted. 04/06/18 on evaluation today the patient's left lateral lower extremity ulcer actually appears to be doing fairly well which is excellent news. There does not appear to be any evidence of infection at this time which is also great news. Overall he still does have a significantly large ulceration although little by little he seems to be making progress. He is down to 10 mg a day of the prednisone. 04/20/18 on evaluation today patient actually appears to be doing excellent at this time in regard to his left lower extremity ulcer. He's making signs of good progress unfortunately this is taking much longer than we would really like to see but nonetheless he is  making progress. Fortunately there does not appear to be any evidence of infection at this time. No fevers, chills, nausea, or vomiting noted at this time.  The patient has not been using the Santyl due to the cost he hadn't got in this field yet. He's mainly been using the antibiotic ointment topically. Subsequently he also tells me that he really has not been scrubbing in the shower I think this would be helpful again as I told him it doesn't have to be anything too aggressive to even make it believe just enough to keep it free of some of the loose slough and biofilm on the wound surface. 05/11/18 on evaluation today patient's wound appears to be making slow but sure progress in regard to the left lateral lower extremity ulcer. He is been tolerating the dressing changes without complication. Fortunately there does not appear to be any evidence of infection at this time. He is still just using triple antibiotic ointment along with clobetasol occasionally over the area. He never got the Santyl and really does not seem to intend to in my pinion. 06/01/18 on evaluation today patient appears to be doing a little better in regard to his left lateral lower extremity ulcer. He states that overall he does not feel like he is doing as well with the Dapsone as he did with the prednisone. Nonetheless he sees his dermatologist later today and is gonna talk to them about the possibility of going back on the prednisone. Overall again I believe that the wound would be better if you would utilize Santyl but he really does not seem to be interested in going back to the [place] at this point. He has been using triple antibiotic ointment. 06/15/18 on evaluation today patient's wound actually appears to be doing about the same at this point. Fortunately there is no signs of infection at this time. He has made slight improvements although he continues to not really want to clean the wound bed at this point. He states that he just doesn't mess with it he doesn't want to cause any problems with everything else he has going on. He has been on medication, antibiotics as prescribed  by his dermatologist, for a staff infection of his lower extremities which is really drying out now and looking much better he tells me. Fortunately there is no sign of overall infection. 06/29/18 on evaluation today patient appears to be doing well in regard to his left lateral lower surety ulcer all things considering. Fortunately his staff infection seems to be greatly improved compared to previous. He has no signs of infection and this is drying up quite nicely. He is still the doxycycline for this is no longer on cental, Dapsone, or any of the other medications. His dermatologist has recommended possibility of an infusion but right now he does not want to proceed with that. 07/13/18 on evaluation today patient appears to be doing about the same in regard to his left lateral lower surety ulcer. Fortunately there's no signs of infection at this time which is great news. Unfortunately he still builds up a significant amount of Slough/biofilm of the surface of the wound he still is not really cleaning this as he should be appropriately. Again I'm able to easily with saline and gauze remove the majority of this on the surface which if you would do this at home would likely be a dramatic improvement for him as far as getting the area to improve. Nonetheless overall I still feel like he  is making progress is just very slow. I think Santyl will be of benefit for him as well. Still he has not gotten this as of this point. 07/27/18 on evaluation today patient actually appears to be doing little worse in regards of the erythema around the periwound region of the wound he also tells me that he's been having more drainage currently compared to what he was experiencing last time I saw him. He states not quite as bad as what he had because this was infected previously but nonetheless is still appears to be doing poorly. Fortunately there is no evidence of systemic infection at this point. The patient tells me that  he is not going to be able to afford the Santyl. He is still waiting to hear about the infusion therapy with his dermatologist. Apparently she wants an updated colonoscopy first. 08/10/18 on evaluation today patient appears to be doing better in regard to his left lateral lower extremity ulcer. Fortunately he is showing signs of improvement in this regard he's actually been approved for Remicade infusion's as well although this has not been scheduled as of yet. Fortunately there's no signs of active infection at this time in regard to the wound although he is having some issues with infection of the right lower extremity is been seen as dermatologist for this. Fortunately they are definitely still working with him trying to keep things under control. [name], [name] ([id number]) 09/07/18 on evaluation today patient is actually doing rather well in regard to his left lateral lower extremity ulcer. He notes these actually having some hair grow back on his extremity which is something he has not seen in years. He also tells me that the pain is really not giving them any trouble at this time which is also good news overall she is very pleased with the progress he's using a combination of the mupirocin along with the probate is all mixed. 09/21/18 on evaluation today patient actually appears to be doing fairly well all things considered in regard to his looks from the ulcer. He's been tolerating the dressing changes without complication. Fortunately there's no signs of active infection at this time which is good news he is still on all antibiotics or prevention of the staff infection. He has been on prednisone for time although he states it is gonna contact his dermatologist and see if she put them on a short course due to some irritation that he has going on currently. Fortunately there's no evidence of any overall worsening this is going very slow I think cental would be something that would be helpful for him  although he states that $50 for tube is quite expensive. He therefore is not willing to get that at this point. 10/06/18 on evaluation today patient actually appears to be doing decently well in regard to his left lateral leg ulcer. He's been tolerating the dressing changes without complication. Fortunately there's no signs of active infection at this time. Overall I'm actually rather pleased with the progress he's making although it's slow he doesn't show any signs of infection and he does seem to be making some improvement. I do believe that he may need a switch up and dressings to try to help this to heal more appropriately and quickly. 10/19/18 on evaluation today patient actually appears to be doing better in regard to his left lateral lower extremity ulcer. This is shown signs of having much less Slough buildup at this point due to the fact he has been using  the Santyl. Obviously this is very good news. The overall size of the wound is not dramatically smaller but again the appearance is. 11/02/18 on evaluation today patient actually appears to be doing quite well in regard to his lower [place] ulcer. A lot of the skin around the ulcer is actually somewhat irritating at this point this seems to be more due to the dressing causing irritation from the adhesive that anything else. Fortunately there is no signs of active infection at this time. 11/24/18 on evaluation today patient appears to be doing a little worse in regard to his overall appearance of his lower extremity ulcer. There's more erythema and warmth around the wound unfortunately. He is currently on doxycycline which he has been on for some time. With that being said I'm not sure that seems to be helping with what appears to possibly be an acute cellulitis with regard to his left lower extremity ulcer. No fevers, chills, nausea, or vomiting noted at this time. 12/08/18 on evaluation today patient's wounds actually appears to be doing  significantly better compared to his last evaluation. He has been using Santyl along with alternating tripling about appointment as well as the steroid cream seems to be doing quite well and the wound is showing signs of improvement which is excellent news. Fortunately there's no evidence of infection and in fact his culture came back negative with only normal skin flora noted. 12/21/2018 upon evaluation today patient actually appears to be doing excellent with regard to his ulcer. This is actually the best that I have seen it since have been helping to take care of him. It is both smaller as well as less slough noted on the surface of the wound and seems to be showing signs of good improvement with new skin growing from the edges. He has been using just the triamcinolone he does wonder if he can get a refill of that ointment today. 01/04/2019 upon evaluation today patient actually appears to be doing well with regard to his left lateral lower extremity ulcer. With that being said it does not appear to be that he is doing quite as well as last time as far as progression is concerned. There does not appear to be any signs of infection or significant irritation which is good news. With that being said I do believe that he may benefit from switching to a collagen based dressing based on how clean The wound appears. 01/18/2019 on evaluation today patient actually appears to be doing well with regard to his wound on the left lower extremity. He is not made a lot of progress compared to where we were previous but nonetheless does seem to be doing okay at this time which is good news. There is no signs of active infection which is also good news. My only concern currently is I do wish we can get him into utilizing the collagen dressing his insurance would not pay for the supplies that we ordered although it appears that he may be able to order this through his supply company that he typically utilizes. This is  [employer]. Nonetheless he did try to order it during the office visit today and it appears this did go through. We will see if he can get that it is a different brand but nonetheless he has collagen and I do think will be beneficial. 02/01/2019 on evaluation today patient actually appears to be doing a little worse today in regard to the overall size of his wounds. Fortunately there  is no signs of active infection at this time. That is visually. Nonetheless when this is happened before it was due to infection. For that reason were somewhat concerned about that this time as well. 02/08/2019 on evaluation today patient unfortunately appears to be doing slightly worse with regard to his wound upon evaluation today. Is measuring a little deeper and a little larger unfortunately. I am not really sure exactly what is causing this to enlarge he actually did see his dermatologist she is going to see about initiating Humira for him. Subsequently she also did do steroid injections into the wound itself in the periphery. Nonetheless still nonetheless he seems to be getting a little bit larger he is gone back to just using the steroid cream topically which I think is appropriate. I would say hold off on the collagen for the time being is definitely a good thing to do. Based on the culture results which we finally did get the final result back regarding it shows staph as the bacteria noted again that can be a normal skin bacteria based on the fact however he is having increased drainage and worsening of the wound measurement wise I would go ahead and place him on an antibiotic today I do believe for this. 02/15/2019 on evaluation today patient actually appears to be doing somewhat better in regard to his ulcer. There is no signs of worsening at this time I did review his culture results which showed evidence of Staphylococcus aureus but not MRSA. Again this could just be more related to the normal skin bacteria  although he states the drainage has slowed down quite a bit he may have had a mild infection not just colonization. And was much smaller and then since around10/04/2019 on evaluation today patient appears to be doing unfortunately worse as far as the size of the wound. I really feel like that this is steadily getting larger again it had been doing excellent right at the beginning of September we have seen a steady increase in the area of the wound it is almost 2-1/2 times the size it was on September 1. Obviously this is a bad trend this is not wanting to see. For that reason we went back to using just the topical triamcinolone cream which does seem to help with inflammation. I checked him for bacteria by way of culture and nothing showed positive there. I am considering giving him a short course of a tapering steroid Dosepak today to see if that is can be beneficial for him. The patient is in agreement with giving that a try. 03/08/2019 on evaluation today patient appears to be doing very well in comparison to last evaluation with regard to his lower extremity ulcer. This is showing signs of less inflammation and actually measuring slightly smaller compared to last time every other week over the past month and a half he has been measuring larger larger larger. Nonetheless I do believe that the issue has been inflammation the prednisone does seem to [facility], [place] ([id number]) have been beneficial for him which is good news. No fevers, chills, nausea, vomiting, or diarrhea. 03/22/2019 on evaluation today patient appears to be doing about the same with regard to his leg ulcer. He has been tolerating the dressing changes without complication. With that being said the wound seems to be mostly arrested at its current size but really is not making any progress except for when we prescribed the prednisone. He did show some signs of dropping as far as  the overall size of the wound during that interval week.  Nonetheless this is something he is not on long-term at this point and unfortunately I think he is getting need either this or else the Humira which his dermatologist has discussed try to get approval for. With that being said he will be seeing his dermatologist on the 11th of this month that is November. 04/19/2019 on evaluation today patient appears to be doing really about the same the wound is measuring slightly larger compared to last time I saw him. He has not been into the office since November 2 due to the fact that he unfortunately had Covid as that his entire family. He tells me that it was rough but they did pull-through and he seems to be doing much better. Fortunately there is no signs of active infection at this time. No fevers, chills, nausea, vomiting, or diarrhea. 05/10/2019 on evaluation today patient unfortunately appears to be doing significantly worse as compared to last time I saw him. He does tell me that he has had his first dose of Humira and actually is scheduled to get the next one in the upcoming week. With that being said he tells me also that in the past several days he has been having a lot of issues with green drainage she showed me a picture this is more blue-green in color. He is also been having issues with increased sloughy buildup and the wound does appear to be larger today. Obviously this is not the direction that we want everything to take based on the starting of his Humira. Nonetheless I think this is definitely a result of likely infection and to be honest I think this is probably Pseudomonas causing the infection based on what I am seeing. 05/24/2019 on evaluation today patient unfortunately appears to be doing significantly worse compared to his prior evaluation with me 2 weeks ago. I did review his culture results which showed that he does have Staph aureus as well as Pseudomonas noted on the culture. Nonetheless the Levaquin that I prescribed for him does  not appear to have been appropriate and in fact he tells me he is no longer experiencing the green drainage and discharge that he had at the last visit. Fortunately there is no signs of active infection at this time which is good news although the wound has significantly worsened it in fact is much deeper than it was previous. We have been utilizing up to this point triamcinolone ointment as the prescription topical of choice but at this time I really feel like that the wound is getting need to be packed in order to appropriately manage this due to the deeper nature of the wound. Therefore something along the lines of an alginate dressing may be more appropriate. 05/31/2019 upon inspection today patient's wound actually showed signs of doing poorly at this point. Unfortunately he just does not seem to be making any good progress despite what we have tried. He actually did go ahead and pick up the Cipro and start taking that as he was noticing more green drainage he had previously completed the Levaquin that I prescribed for him as well. Nonetheless he missed his appointment for the seventh last week on Wednesday with the wound care center and [facility] where his dermatologist referred him. Obviously I do think a second opinion would be helpful at this point especially in light of the fact that the patient seems to be doing so poorly despite the fact  that we have tried everything that I really know how at this point. The only thing that ever seems to have helped him in the past is when he was on high doses of continual steroids that did seem to make a difference for him. 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 06/07/2019 upon evaluation today patient unfortunately appears to be doing worse yet again with regard to his wound. In fact I am  starting to question whether or not he may have a fluid pocket in the muscle at this point based on the bulging and the soft appearance to the central portion of the muscle area. There is not anything draining from the muscle itself at this time which is good news but nonetheless the wound is expanding. I am not really seeing any results of the Humira as far as overall wound progression based on what I am seeing at this point. The patient has been referred for second opinion with regard to his wound to the [facility] wound care center by his dermatologist which I definitely am not in opposition to. Unfortunately we tried multiple dressings in the past including collagen, alginate, and at one point even Hydrofera Blue. With that being said he is never really used it for any significant amount of time due to the fact that he often complains of pain associated with these dressings and then will go back to either using the Santyl which she has done intermittently or more frequently the triamcinolone. He is also using his own compression stockings. We have wrapped him in the past but again that was something else that he really was not a big fan of. 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 He seemed to do much better when he was on the steroids although honestly I understand that the steroids are not necessarily the best medication to be on long-term obviously 06/14/2019 on evaluation today patient appears to be doing actually a little bit better with regard to the overall appearance with his leg. Unfortunately he does continue to have issues with what appears to be some fluid underneath the  muscle although he did see the wound specialty center at [facility] last week their main goals were to see about infusion therapy in place of the Humira as they feel like that is not quite strong enough. They also recommended that we continue with the treatment otherwise as we are they felt like that was appropriate and they are okay with him continuing to follow-up here with [place] in that regard. With that being said they are also sending him to the vein specialist there to see about vein stripping and if that would be of benefit for him. Subsequently they also did not really address whether or not an ultrasound of the muscle area to see if there is anything that needs to be addressed here would be appropriate or not. For that reason I discussed this with him last week I think we  may proceed down that road at this point. 06/21/2019 upon evaluation today patient's wound actually appears to be doing slightly better compared to previous evaluations. I do believe that he has made a difference with regard to the progression here with the use of oral steroids. Again in the past has been the only thing that is really calm things down. He does tell me that from [facility] is gotten a good news from there that there are no further vein stripping that is necessary at this point. I do not have that available for review today although the patient did relay this to me. He also did obtain and have the ultrasound of the wound completed which I did sign off on today. It does appear that there is no fluid collection under the muscle this is likely then just edematous tissue in general. That is also good news. Overall I still believe the inflammation is the main issue here. He did inquire about the possibility of a wound VAC again with the muscle protruding like it is I am not really sure whether the wound VAC is necessarily ideal or not. That is something we will have to consider although I do believe he may need compression wrapping to  try to help with edema control which could potentially be of benefit. 06/28/2019 on evaluation today patient appears to be doing slightly better measurement wise although this is not terribly smaller he least seems to be trending towards that direction. With that being said he still seems to have purulent drainage noted in the wound bed at this time. He has been on Levaquin followed by Cipro over the past month. Unfortunately he still seems to have some issues with active infection at this time. I did perform a culture last week in order to evaluate and see if indeed there was still anything going on. Subsequently the culture did come back showing Pseudomonas which is consistent with the drainage has been having which is blue-green in color. He also has had an odor that again was somewhat consistent with Pseudomonas as well. Long story short it appears that the culture showed an intermediate finding with regard to how well the Cipro will work for the Pseudomonas infection. Subsequently being that he does not seem to be clearing up and at best what we are doing is just keeping this at [place] I think he may need to see infectious disease to discuss IV antibiotic options. FRANCISCO, OSTROVSKY ([id number]) 07/05/2019 upon evaluation today patient appears to be doing okay in regard to his leg ulcer. He has been tolerating the dressing changes at this point without complication. Fortunately there is no signs of active infection at this time which is good news. No fevers, chills, nausea, vomiting, or diarrhea. With that being said he does have an appointment with infectious disease tomorrow and his primary care on Wednesday. Again the reason for the infectious disease referral was due to the fact that he did not seem to be fully resolving with the use of oral antibiotics and therefore we were thinking that IV antibiotic therapy may be necessary secondary to the fact that there was an intermediate finding for  how effective the Cipro may be. Nonetheless again he has been having a lot of purulent and even green drainage. Fortunately right now that seems to have calmed down over the past week with the reinitiation of the oral antibiotic. Nonetheless we will see what Dr. Megan Salon has to say. 07/12/2019 upon evaluation today patient appears to be  doing about the same at this point in regard to his left lower extremity ulcer. Fortunately there is no signs of active infection at this time which is good news I do believe the Levaquin has been beneficial I did review Dr. Hale Bogus note and to be honest I agree that the patient's leg does appear to be doing better currently. What we found in the past as he does not seem to really completely resolve he will stop the antibiotic and then subsequently things will revert back to having issues with blue-green drainage, increased pain, and overall worsening in general. Obviously that is the reason I sent him back to infectious disease. 07/19/2019 upon evaluation today patient appears to be doing roughly the same in size there is really no dramatic improvement. He has started back on the Levaquin at this point and though he seems to be doing okay he did still have a lot of blue/green drainage noted on evaluation today unfortunately. I think that this is still indicative more likely of a Pseudomonas infection as previously noted and again he does see Dr. Megan Salon in just a couple of days. I do not know that were really able to effectively clear this with just oral antibiotics alone based on what I am seeing currently. Nonetheless we are still continue to try to manage as best we can with regard to the patient and his wound. I do think the wrap was helpful in decreasing the edema which is excellent news. No fevers, chills, nausea, vomiting, or diarrhea. 07/26/2019 upon evaluation today patient appears to be doing slightly better with regard to the overall appearance of the muscle  there is no dark discoloration centrally. Fortunately there is no signs of active infection at this time. No fevers, chills, nausea, vomiting, or diarrhea. Patient's wound bed currently the patient did have an appointment with Dr. Megan Salon at infectious disease last week. With that being said Dr. Megan Salon the patient states was still somewhat hesitant about put him on any IV antibiotics he wanted [place] to repeat cultures today and then see where things go going forward. He does look like Dr. Megan Salon because of some improvement the patient did have with the Levaquin wanted [place] to see about repeating cultures. If it indeed grows the Pseudomonas again then he recommended a possibility of considering a PICC line placement and IV antibiotic therapy. He plans to see the patient back in 1 to 2 weeks. 08/02/2019 upon evaluation today patient appears to be doing poorly with regard to his left lower extremity. We did get the results of his culture back it shows that he is still showing evidence of Pseudomonas which is consistent with the purulent/blue-green drainage that he has currently. Subsequently the culture also shows that he now is showing resistance to the oral fluoroquinolones which is unfortunate as that was really the only thing to treat the infection prior. I do believe that he is looking like this is going require IV antibiotic therapy to get this under control. Fortunately there is no signs of systemic infection at this time which is good news. The patient does see Dr. Megan Salon tomorrow. 08/09/2019 upon evaluation today patient appears to be doing better with regard to his left lower extremity ulcer in regard to the overall appearance. He is currently on IV antibiotic therapy. As ordered by Dr. Megan Salon. Currently the patient is on ceftazidime which she is going to take for the next 2 weeks and then follow-up for 4 to 5-week appointment with Dr. Megan Salon.  The patient started this this past Friday  symptoms have not for a total of 3 days currently in full. 08/16/2019 upon evaluation today patient's wound actually does show muscle in the base of the wound but in general does appear to be much better as far as the overall evidence of infection is concerned. In fact I feel like this is for the most part cleared up he still on the IV antibiotics he has not completed the full course yet but I think he is doing much better which is excellent news. 08/23/2019 upon evaluation today patient appears to be doing about the same with regard to his wound at this point. He tells me that he still has pain unfortunately. Fortunately there is no evidence of systemic infection at this time which is great news. There is significant muscle protrusion. 09/13/19 upon evaluation today patient appears to be doing about the same in regard to his leg unfortunately. He still has a lot of drainage coming from the ulceration there is still muscle exposed. With that being said the patient's last wound culture still showed an intermediate finding with regard to the Pseudomonas he still having the bluish/green drainage as well. Overall I do not know that the wound has completely cleared of infection at this point. Fortunately there is no signs of active infection systemically at this point which is good news. 09/20/2019 upon evaluation today patient's wound actually appears to be doing about the same based on what I am seeing currently. I do not see any signs of systemic infection he still does have evidence of some local infection and drainage. He did see Dr. Megan Salon last week and Dr. Megan Salon states that he probably does need a different IV antibiotic although he does not want to put him on this until the patient begins the Remicade infusion which is actually scheduled for about 10 days out from today on 13 May. Following that time Dr. Megan Salon is good to see him back and then will evaluate the feasibility of starting him on the  IV antibiotic therapy once again at that point. I do not disagree with this plan I do believe as Dr. Megan Salon stated in his note that I reviewed today that the patient's issue is multifactorial with the pyoderma being 1 aspect of this that were hoping the Remicade will be helpful for her. In the meantime I think the gentamicin is, helping to keep things under decent okay control in regard to the ulcer. 09/27/2019 upon evaluation today patient appears to be doing about the same with regard to his wound still there is a lot of muscle exposure though he does have some hyper granulation tissue noted around the edge and actually some granulation tissue starting to form over the muscle which is actually good news. Fortunately there is no evidence of active infection which is also good news. His pain is less at this point. 5/21; this is a patient I have not seen in a long time. 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 He has a large wound on the left lateral leg with protruding muscle. He comes in the clinic today showing the same area on his left medial ankle. He says there is been a spot there for some time although we have not previously defined this. Today he has a clearly defined area with slight amount of skin breakdown surrounded by raised areas with a purplish hue in color. This is not painful he says it is irritated.  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 He still has muscle protruding from the lateral portion of his left leg but fortunately the new area that they were concerned about at his last visit does not appear to have opened at this point. He is currently on Remicade infusions and seems to be doing better in my opinion in fact the wound itself seems to be overall much better. The purplish discoloration that he did have seems to have resolved  and I think that is a good sign that hopefully the Remicade is doing its job. He does have some biofilm noted over the surface of the wound. 11/01/2019 on evaluation today patient's wound actually appears to be doing excellent at this time. Fortunately there is no evidence of active infection and overall I feel like he is making great progress. The Remicade seems to be due excellent job in my opinion. GADGE, HERMIZ ([id number]) 11/08/19 evaluation today vision actually appears to be doing quite well with regard to his weight ulcer. He's been tolerating dressing changes without complication. Fortunately there is no evidence of infection. No fevers, chills, nausea, or vomiting noted at this time. Overall states that is having more itching than pain which is actually a good sign in my opinion. 12/13/2019 upon evaluation today patient appears to be doing well today with regard to his wound. He has been tolerating the dressing changes without complication. Fortunately there is no sign of active infection at this time. No fevers, chills, nausea, vomiting, or diarrhea. Overall I feel like the infusion therapy has been very beneficial for him. 01/06/2020 on evaluation today patient appears to be doing well with regard to his wound. This is measuring smaller and actually looks to be doing better. Fortunately there is no signs of active infection at this point. No fevers, chills, nausea, vomiting, or diarrhea. With that being said he does still have the blue-green drainage but this does not seem to be causing any significant issues currently. He has been using the gentamicin that does seem to be keeping things under decent control at this point. He goes later this morning for his next infusion therapy for the pyoderma which seems to also be very beneficial. 02/07/2020 on evaluation today patient appears to be doing about the same in regard to his wounds currently. Fortunately there is no signs of active infection  systemically he does still have evidence of local infection still using gentamicin. He also is showing some signs of improvement albeit slowly I do feel like we are making some progress here. 02/21/2020 upon evaluation today patient appears to be making some signs of improvement the wound is measuring a little bit smaller which is great news and overall I am very pleased with where he stands currently. He is going to be having infusion therapy treatment on the 15th of this month. Fortunately there is no signs of active infection at this time. 03/13/2020 I do believe patient's wound is actually showing some signs of improvement here which is great news. He has continue with the infusion therapy through rheumatology/dermatology at [name]. That does seem to be beneficial. I still think he gets as much benefit from this as he did from the prednisone initially but nonetheless obviously this is less harsh on his body that the prednisone as far as they are concerned. 03/31/2020 on evaluation today patient's wound actually showing signs of some pretty good improvement in regard  to the overall appearance of the wound bed. There is still muscle exposed though he does have some epithelial growth around the edges of the wound. Fortunately there is no signs of active infection at this time. No fevers, chills, nausea, vomiting, or diarrhea. 04/24/2020 upon evaluation today patient appears to be doing about the same in regard to his leg ulcer. He has been tolerating the dressing changes without complication. Fortunately there is no signs of active infection at this time. No fevers, chills, nausea, vomiting, or diarrhea. With that being said he still has a lot of irritation from the bandaging around the edges of the wound. We did discuss today the possibility of a referral to plastic surgery. 05/22/2020 on evaluation today patient appears to be doing well with regard to his wounds all things considered. He has not been able  to get the Chantix apparently there is a recall nurse that I was unaware of put out by [employer] involuntarily. Nonetheless for now I am and I have to do some research into what may be the best option for him to help with quitting in regard to smoking and we discussed that today. 06/26/2020 upon evaluation today patient appears to be doing well with regard to his wound from the standpoint of infection I do not see any signs of infection at this point. With that being said unfortunately he is still continuing to have issues with muscle exposure and again he is not having a whole lot of new skin growth unfortunately. There does not appear to be any signs of active infection at this time. No fevers, chills, nausea, vomiting, or diarrhea. 07/10/2020 upon evaluation today patient appears to be doing a little bit more poorly currently compared to where he was previous. I am concerned currently about an active infection that may be getting worse especially in light of the increased size and tenderness of the wound bed. No fevers, chills, nausea, vomiting, or diarrhea. 07/24/2020 upon evaluation today patient appears to be doing poorly in regard to his leg ulcer. He has been tolerating the dressing changes without complication but unfortunately is having a lot of discomfort. 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 Subsequently I think he is going require possibly IV antibiotics to get this under control. I am very concerned about the severity of his infection and the amount of discomfort he is having. 07/31/2020 upon evaluation today patient appears to be doing about the same in regard to his leg wound. He did see Dr. Megan Salon and Dr. Megan Salon is actually going to start him on IV antibiotics. He goes for the PICC line tomorrow. With that being said there do not have that run for 2 weeks and then see how things are doing and depending on how he  is progressing they may extend that a little longer. Nonetheless I am glad this is getting ready to be in place and definitely feel it may help the patient. In the meantime is been using mainly triamcinolone to the wound bed has an anti-inflammatory. 08/07/2020 on evaluation today patient appears to be doing well with regard to his wound compared even last week. In the interim he has gotten the PICC line placed and overall this seems to be doing excellent. There does not appear to be any evidence of infection which is great news systemically although locally of course has had the infection this appears to be improving with the use of the antibiotics. 08/14/2020 upon  evaluation today patient's wound actually showing signs of excellent improvement. Overall the irritation has significantly improved the drainage is back down to more of a normal level and his pain is really pretty much nonexistent compared to what it was. Obviously I think that this is significantly improved secondary to the IV antibiotic therapy which has made all the difference in the world. Again he had a resistant form of Pseudomonas for which oral antibiotics just was not cutting it. Nonetheless I do think that still we need to consider the possibility of a surgical closure for this wound is been open so long and to be honest with muscle exposed I think this can be very hard to get this to close outside of this although definitely were still working to try to do what we can in that regard. 08/21/2020 upon evaluation today patient appears to be doing very well with regard to his wounds on the left lateral lower extremity/calf area. Fortunately there does not appear to be signs of active infection which is great news and overall very pleased with where things stand today. He is actually wrapping up his treatment with IV antibiotics tomorrow. After that we will see where things go from there. 08/28/2020 upon evaluation today patient appears  to be doing decently well with regard to his leg ulcer. There does not appear to be any signs of active infection which is great news and overall very pleased with where things stand today. No fevers, chills, nausea, vomiting, or diarrhea. 09/18/2020 upon evaluation today patient appears to be doing well with regard to his infection which I feel like is better. Unfortunately he is not doing as well with regard to the overall size of the wound which is not nearly as good at this point. I feel like that he may be having an issue here with the pyoderma being somewhat out of control. I think that he may benefit from potentially going back and talking to the dermatologist about THORN, DEMAS ([id number]) what to do from the pyoderma standpoint. I am not certain if the infusions are helping nearly as much is what the prednisone did in the past. 10/02/2020 upon evaluation today patient appears to be doing well with regard to his leg ulcer. He did go to the Psychiatric nurse. Unfortunately they feel like there is a 10% chance that most that he would be able to heal and that the skin graft would take. Obviously this has led him to not be able to go down that path as far as treatment is concerned. Nonetheless he does seem to be doing a little bit better with the prednisone that I gave him last time. I think that he may need to discuss with dermatology the possibility of long-term prednisone as that seems to be what is most helpful for him to be perfectly honest. I am not sure the Remicade is really doing the job. 10/17/2020 upon evaluation today patient appears to be doing a little better in regard to his wound. In fact the case has been since we did the prednisone on May 2 for him that we have noticed a little bit of improvement each time we have seen a size wise as well as appearance wise as well as pain wise. I think the prednisone has had a greater effect then the infusion therapy has to be perfectly honest. With  that being said the patient also feels significantly better compared to what he was previous. All of this is good news but  nonetheless I am still concerned about the fact that again we are really not set up to long-term manage him as far as prednisone is concerned. Obviously there are things that you need to be watched I completely understand the risk of prednisone usage as well. 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 With all that being said I do believe that we can give him another round of the prednisone which she is requesting today because of the improvement that he seen since we did that first round. 10/30/2020 upon evaluation today patient's wound actually is showing signs of doing quite well. There does not appear to be any evidence of infection which is great news and overall very pleased with where things stand today. No fevers, chills, nausea, vomiting, or diarrhea. He tells me that the prednisone still has seem to have helped he wonders if we can extend that for just a little bit longer. He did not have the appointment with a dermatologist although he did have an infusion appointment last Friday. That was at [facility]. With that being said he tells me he could not do both that as well as the appointment with the physician on the same day therefore that is can have to be rescheduled. I really want to see if there is anything they feel like that could be done differently to try to help this out as I am not really certain that the infusions are helping significantly here. 11/13/2020 upon evaluation today patient unfortunately appears to be doing somewhat poorly in regard to his wound I feel like this is actually worsening from the standpoint of the pyoderma spreading. I still feel like that he may need something different as far as trying to manage this going forward. Again we did the prednisone unfortunately his blood sugars are not doing so well  because of this. Nonetheless I believe that the patient likely needs to try topical steroid. We have done triamcinolone for a while I think going with something stronger such as clobetasol could be beneficial again this is not something I do lightly I discussed this with the patient that again this does not normally put underneath an occlusive dressing. Nonetheless I think a thin film as such could help with some of the stronger anti-inflammatory effects. We discussed this today. He would like to try to give this a trial for the next couple weeks. I definitely think that is something that we can do. Evaluate7/03/2021 and today patient's wound bed actually showed signs of doing really about the same. There was a little expansion of the size of the wound and that leading edge that we done looking out although the clobetasol does seem to have slowed this down a bit in my opinion. There is just 1 small area that still seems to be progressing based on what I see. Nonetheless I am concerned about the fact this does not seem to be improving if anything seems to be doing a little bit worse. I do not know that the infusions are really helping him much as next infusion is August 5 his appointment with dermatology is July 25. Either way I really think that we need to have a conversation potentially about this and I am actually going to see if I can talk with Dr. Lillia Carmel in order to see where things stand as well. 12/11/2020 upon evaluation today patient appears to be doing worse in regard to his leg ulcer. Unfortunately  I just do not think this is making the progress that I would like to see at this point. Honestly he does have an appointment with dermatology and this is in 2 days. I am wondering what they may have to offer to help with this. Right now what I am seeing is that he is continuing to show signs of worsening little by little. Obviously that is not great at all. Is the exact opposite of what we are  looking for. 12/18/2020 upon evaluation today patient appears to be doing a little better in regard to his wound. The dermatologist actually did do some steroid injections into the wound which does seem to have been beneficial in my opinion. That was on the 25th already this looks a little better to me than last time I saw him. With that being said we did do a culture and this did show that he has Staph aureus noted in abundance in the wound. With that being said I do think that getting him on an oral antibiotic would be appropriate as well. Also think we can compression wrap and this will make a difference as well. 12/28/2020 upon evaluation today patient's wound is actually showing signs of doing much better. I do believe the compression wrap is helping he has a lot of drainage but to be honest I think that the compression is helping to some degree in this regard as well as not draining through which is also good news. No fevers, chills, nausea, vomiting, or diarrhea. 01/04/2021 upon evaluation today patient appears to be doing well with regard to his wound. Overall things seem to be doing quite well. He did have a little bit of reaction to the CarboFlex Sorbact he will be using that any longer. With that being said he is controlled as far as the drainage is concerned overall and seems to be doing quite well. I do not see any signs of active infection at this time which is great news. No fevers, chills, nausea, vomiting, or diarrhea. 01/11/2021 upon evaluation today patient appears to be doing well with regard to his wounds. He has been tolerating the dressing changes without complication. Fortunately there does not appear to be any signs of active infection at this time which is great news. Overall I am extremely pleased with where we stand currently. No fevers, chills, nausea, vomiting, or diarrhea. Where using clobetasol in the wound bed he has a lot of new skin growth which is awesome as  well. 01/18/2021 upon evaluation today patient appears to be doing very well in regard to his leg ulcer. He has been tolerating the dressing changes without complication. Fortunately there does not appear to be any signs of active infection which is great news. In general I think that he is making excellent progress 01/25/2021 upon evaluation today patient appears to be doing well with regard to his wound on the leg. I am actually extremely pleased with where things stand today. There does not appear to be any signs of active infection which is great news and overall I think that we are definitely headed in the appropriate direction based on what I am seeing currently. There does not appear to be any signs of active infection also excellent news. Electronic Signature(s) Signed: 01/25/2021 10:19:22 AM By: Worthy Keeler PA-C Entered By: Worthy Keeler on 01/25/2021 10:19:22 [name], [name] ([id number]) [name], [name] ([id number]) -------------------------------------------------------------------------------- Physical Exam Details Patient Name: [name] Date of Service: 01/25/2021 8:00 AM Medical Record Number: [id number]  Patient Account Number: [id number] Date of Birth/Sex: [birth year] (40 y.o. M) Treating RN: Donnamarie Poag Primary Care Provider: [name] Other Clinician: Referring Provider: [name] Treating Provider/Extender: [name] Cliche in Treatment: 8 Constitutional Obese and well-hydrated in no acute distress. Respiratory normal breathing without difficulty. Psychiatric this patient is able to make decisions and demonstrates good insight into disease process. Alert and Oriented x 3. pleasant and cooperative. Notes Patient's wound bed showed signs of great granulation epithelization at this point. I think were making wonderful progress and overall I am extremely pleased with where we stand today. There does not appear to be any signs of active infection and I am very  happy in that regard overall I think we are moving towards complete closure. Electronic Signature(s) Signed: 01/25/2021 10:19:41 AM By: Worthy Keeler PA-C Entered By: Worthy Keeler on 01/25/2021 10:19:41 [name], [name] ([id number]) -------------------------------------------------------------------------------- Physician Orders Details Patient Name: [name] Date of Service: 01/25/2021 8:00 AM Medical Record Number: [id number] Patient Account Number: [id number] Date of Birth/Sex: 1980-06-19 (40 y.o. 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 - Next Thursday-(wrap twice weekly) o Nurse Visit as needed - Monday Bathing/ Shower/ Hygiene o Clean wound with Normal Saline or wound cleanser. Edema Control - Lymphedema / Segmental Compressive Device / Other o Optional: One layer of unna paste to top of compression wrap (to act as an anchor). o 4 Layer Compression System Lymphedema. Medications-Please add to medication list. o P.O. Antibiotics Wound Treatment Wound #1 - Lower Leg Wound Laterality: Left, Lateral Cleanser: Soap and Water 2 x Per Week/30 Days Discharge Instructions: Gently cleanse wound with antibacterial soap, rinse and pat dry prior to dressing wounds Peri-Wound Care: Desitin Maximum Strength Ointment 4 (oz) 2 x Per Week/30 Days Peri-Wound Care:  Triamcinolone Acetonide Cream, 0.1%, 15 (g) tube 2 x Per Week/30 Days Discharge Instructions: Apply on posterior leg Topical: Clobetasol Propionate ointment 0.05%, 60 (g) tube 2 x Per Week/30 Days Discharge Instructions: Apply in undermining area and reddened area on posterior leg Primary Dressing: Calcium Alginate Dressing, 6x6 (in/in) 2 x Per Week/30 Days Discharge Instructions: Apply to wound bed Secondary Dressing: Xtrasorb Large 6x9 (in/in) 2 x Per Week/30 Days Discharge Instructions: Apply to wound as directed. Do not cut. Compression Wrap: Medichoice 4 layer Compression System, 35-40 mmHG (Generic) 2 x Per Week/30 Days Discharge Instructions: Apply multi-layer wrap as directed. Add-Ons: CarboFLEX Odor Control Dressing, 4x4 (in/in) 2 x Per Week/30 Days Discharge Instructions: PLEASE APPLY OVER THE XSORB DO NOT APPLY ON SKIN Place the fibrous (non-shiny) surface of the dressing directly onto the wound for management of malodorous wounds. Electronic Signature(s) [name], [name] ([id number]) Signed: 01/25/2021 10:32:15 AM By: Donnamarie Poag Signed: 01/26/2021 11:43:44 AM By: Worthy Keeler PA-C Entered By: Donnamarie Poag on 01/25/2021 08:32:24 [name], [name] ([id number]) -------------------------------------------------------------------------------- Problem List Details Patient Name: [name] Date of Service: 01/25/2021 8:00 AM Medical Record Number: [id number] Patient Account Number: [id number] Date of Birth/Sex: 1980-12-01 (40 y.o. 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 Yes L97.321 Non-pressure chronic ulcer of left ankle limited to breakdown of  skin 10/08/2019 No Yes I87.2 Venous insufficiency (chronic) (peripheral) 12/04/2016 No Yes L03.116 Cellulitis of left lower limb 05/24/2019 No Yes F17.208 Nicotine dependence, unspecified, with other nicotine-induced disorders 04/24/2020 No Yes Inactive Problems ICD-10 Code Description Active Date Inactive Date L97.213 Non-pressure chronic ulcer of right calf with necrosis of muscle 04/02/2017 04/02/2017 Resolved Problems Electronic Signature(s) Signed: 01/25/2021 8:27:26 AM By: Worthy Keeler PA-C Entered By: Worthy Keeler on 01/25/2021 08:27:25 [name] ([id number]) -------------------------------------------------------------------------------- Progress Note Details Patient Name: [name] Date of Service: 01/25/2021 8:00 AM Medical Record Number: [id number] Patient Account Number: [id number] Date of Birth/Sex: 1980-11-25 (40 y.o. M) Treating RN: Donnamarie Poag Primary Care Provider: [name] Other Clinician: Referring Provider: [name] Treating Provider/Extender: [name] Cliche in Treatment: 216 Subjective Chief Complaint Information obtained from Patient He is here in follow up evaluation for LLE pyoderma ulcer History of Present Illness (HPI) 12/04/16; 40 year old man who comes into the clinic today for review of a wound on the posterior left calf. He tells me that is been there for about a year. He is not a diabetic he does smoke half a pack per day. He was seen in the ER on 11/20/16 felt to have cellulitis around the wound and was given clindamycin. An x-ray did not show osteomyelitis. The patient initially tells me that he has a milk allergy that sets off a pruritic itching rash on his lower legs which she scratches incessantly and he thinks that's what may have set up the wound. He has been using various topical antibiotics and ointments without any effect. He works in a [employer] and is on his feet all day. He does not have a prior history of  wounds however he does have the rash on both lower legs the right arm and the ventral aspect of his left arm. These are excoriations and clearly have had scratching however there are of macular looking areas on both legs including a substantial larger area on the right leg. This does not have an underlying open area. There is no blistering. The patient tells me that 2 years ago in [place] in response to the rash on his legs he saw a dermatologist who told him he had a condition which may be pyoderma gangrenosum although I may be putting words into his mouth. He seemed to recognize this. On further questioning he admits to a 5 year history of quiesced. ulcerative colitis. He is not in any treatment for this. He's had no recent travel 12/11/16; the patient arrives today with his wound and roughly the same condition we've been using silver alginate this is a deep punched out wound with some surrounding erythema but no tenderness. Biopsy I did did not show confirmed pyoderma gangrenosum suggested nonspecific inflammation and vasculitis but does not provide an actual description of what was seen by the pathologist. I'm really not able to understand this We have also received information from the patient's dermatologist in [place] notes from April 2016. This was a doctor Agarwal-antal. 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 He was prescribed topical steroid high potency under occlusion which helped but at this point the patient did not have a deep  punched out wound. 12/18/16; the patient's wound is larger in terms of surface area however this surface looks better and there is less depth. The surrounding erythema also is better. The patient states that the wrap we put on came off 2 days ago when he has been using his compression stockings. He we are in the process of getting a dermatology consult. 12/26/16 on evaluation today patient's left lower extremity wound shows evidence of infection with  surrounding erythema noted. He has been tolerating the dressing changes but states that he has noted more discomfort. There is a larger area of erythema surrounding the wound. No fevers, chills, nausea, or vomiting noted at this time. With that being said the wound still does have slough covering the surface. He is not allergic to any medication that he is aware of at this point. In regard to his right lower extremity he had several regions that are erythematous and pruritic he wonders if there's anything we can do to help that. 01/02/17 I reviewed patient's wound culture which was obtained his visit last week. He was placed on doxycycline at that point. Unfortunately that does not appear to be an antibiotic that would likely help with the situation however the pseudomonas noted on culture is sensitive to Cipro. Also unfortunately patient's wound seems to have a large compared to last week's evaluation. Not severely so but there are definitely increased measurements in general. He is continuing to have discomfort as well he writes this to be a seven out of 10. In fact he would prefer me not to perform any debridement today due to the fact that he is having discomfort and considering he has an active infection on the little reluctant to do so anyway. No fevers, chills, nausea, or vomiting noted at this time. 01/08/17; patient seems dermatology on September 5. I suspect dermatology will want the slides from the biopsy I did sent to their pathologist. I'm not sure if there is a way we can expedite that. In any case the culture I did before I left on vacation 3 weeks ago showed Pseudomonas he was given 10 days of Cipro and per her description of her intake nurses is actually somewhat better this week although the wound is quite a bit bigger than I remember the last time I saw this. He still has 3 more days of Cipro 01/21/17; dermatology appointment tomorrow. He has completed the ciprofloxacin for Pseudomonas.  Surface of the wound looks better however he is had some deterioration in the lesions on his right leg. Meantime the left lateral leg wound we will continue with sample 01/29/17; patient had his dermatology appointment but I can't yet see that note. He is completed his antibiotics. The wound is more superficial but considerably larger in circumferential area than when he came in. This is in his left lateral calf. He also has swollen erythematous areas with superficial wounds on the right leg and small papular areas on both arms. There apparently areas in her his upper thighs and buttocks I did not look at those. Dermatology biopsied the right leg. Hopefully will have their input next week. 02/05/17; patient went back to see his dermatologist who told him that he had a "scratching problem" as well as staph. He is now on a 30 day course of doxycycline and I believe she gave him triamcinolone cream to the right leg areas to help with the itching [not exactly sure but probably triamcinolone]. She apparently looked at the left lateral leg wound  although this was not rebiopsied and I think felt to be ultimately part of the same pathogenesis. He is using sample border foam and changing nevus himself. He now has a new open area on the right posterior leg which was his biopsy site I don't have any of the dermatology notes 02/12/17; we put the patient in compression last week with SANTYL to the wound on the left leg and the biopsy. Edema is much better and the depth of the wound is now at level of skin. Area is still the same Biopsy site on the right lateral leg we've also been using santyl with a border foam dressing and he is changing this himself. 02/19/17; Using silver alginate started last week to both the substantial left leg wound and the biopsy site on the right wound. He is tolerating compression well. Has a an appointment with his primary M.D. tomorrow wondering about diuretics although I'm wondering if  the edema problem is actually lymphedema [name], [name] ([id number]) 02/26/17; the patient has been to see his primary doctor Dr. Jerrel Ivory at [place] our primary care. She started him on Lasix 20 mg and this seems to have helped with the edema. However we are not making substantial change with the left lateral calf wound and inflammation. The biopsy site on the right leg also looks stable but not really all that different. 03/12/17; the patient has been to see vein and vascular Dr. Lucky Cowboy. He has had venous reflux studies I have not reviewed these. I did get a call from his dermatology office. 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 They therefore recommended clobetasol and tetracycline i.e. 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 03/26/17; apparently the patient just had reflux studies not an appointment with Dr. dew. She arrives in clinic today having applied clobetasol for 2-3 weeks. He notes over the last 2-3 days excessive drainage having to change the dressing 3-4 times a day and also expanding erythema. 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 Lillia Carmel at [facility] dermatology locally who kindly saw him at my request. 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 My concern was the degree of erythema medially extending from his left leg wound which was either inflammation from pyoderma or cellulitis. I  put him on Augmentin however culture of the wound showed Pseudomonas which is quinolone sensitive. I really don't believe he has cellulitis however in view of everything I will continue and give him a course of Cipro. He is also on doxycycline as an immune modulator for the pyoderma. In addition to his original wound on the left lateral leg with surrounding erythema he has a wound on the right posterior calf which was an original biopsy site done by dermatology. 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 Saw Dr. Lillia Carmel yesterday. He has been using topical antibiotics to both wound areas his original wound on the left and  the biopsies/pathergy area on the right. There is definitely some improvement in the inflammation around the wound on the right although the patient states he has increasing sensitivity of the wounds. He is on prednisone 60 and doxycycline 1 as prescribed by Dr. Lillia Carmel. He is covering the topical antibiotic with gauze and putting this in his own compression stocks and changing this daily. He states that Dr. Lottie Rater did a culture of the left leg wound yesterday 05/07/17; pyoderma gangrenosum. The patient saw Dr. Lillia Carmel yesterday and has a follow-up with her in one month. He is still using topical antibiotics to both wounds although he can't recall exactly what type. He is still on prednisone 60 mg. Dr. Lillia Carmel stated that the doxycycline could stop if we were in agreement. 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 The right medial leg was induced by biopsy/pathergy. The area on the right is essentially healed. 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 The right medial leg has closed and remains closed. He is still on  prednisone 60. He tells me he missed his last dermatology appointment with Dr. Lillia Carmel but will make another appointment. He reports that her blood sugar at a recent screen in [place] was high 200's. He was 180 today. 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 I think he is going to require still much longer prednisone perhaps another 3 months before attempting to taper. In the meantime his wound is a lot better. Smaller. He is cleaning this off daily and applying topical antibiotics. When he was last in the clinic I thought about changing to [facility] and actually put in a couple of calls to dermatology although probably not during their business hours. 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 He continues on oral prednisone. He has been using triple antibiotic ointment. There is surface debris and we will transition to [facility] and have him return in 2 weeks. He has lost 30 pounds since his last appointment with lifestyle modification. He may benefit from topical steroid cream for treatment this can be considered at a later date. 08/22/17 on evaluation today patient appears to actually be doing rather well in regard to his left lateral lower extremity ulcer. He has actually been managed by Dr. Dellia Nims most recently. Patient is currently on oral steroids at this time. This seems to have been of benefit for him. Nonetheless his last visit was actually with Leah on 08/06/17. Currently he is not utilizing any topical steroid creams although this could be of benefit as well. No fevers, chills, nausea, or vomiting noted at this time. 09/05/17 on evaluation today patient appears to be doing better in regard to his left lateral lower extremity ulcer. He has been tolerating the dressing changes without complication. He is using Santyl with good effect. Overall I'm very pleased  with how things are standing at this point. Patient likewise is happy that this is doing better. 09/19/17 on evaluation today patient actually appears to be doing rather well in regard to his left lateral lower extremity ulcer. 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 He's  not having any significant pain. 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 He has been tolerating the Santyl without complication and in general I feel like he's making good progress. 10/17/17 on evaluation today patient appears to be doing very well in regard to his left lateral lower surety ulcer. He has been tolerating the dressing changes without complication. There does not appear to be any evidence of infection he's alternating the Santyl and the triple antibiotic ointment every other day this seems to be doing well for him. 11/03/17 on evaluation today patient appears to be doing very well in regard to his left lateral lower extremity ulcer. He is been tolerating the dressing changes without complication which is good news. Fortunately there does not appear to be any evidence of infection which is also great news. Overall is doing excellent they are starting to taper down on the prednisone is down to 40 mg at this point it also started topical clobetasol for him. 11/17/17 on evaluation today patient appears to be doing well in regard to his left lateral lower surety ulcer. He's been tolerating the dressing changes without complication. He does note that he is having no pain, no excessive drainage or discharge, and overall he feels like things are going about how he would expect and hope they would. Overall he seems to have no evidence of infection at this time in my opinion which is good news. 12/04/17-He is seen in follow-up evaluation for  right lateral lower extremity ulcer. He has been applying topical steroid cream. Today's measurement show slight increase in size. Over the next 2 weeks we will transition to every other day Santyl and steroid cream. He has been encouraged to monitor for changes and notify clinic with any concerns 12/15/17 on evaluation today patient's left lateral motion the ulcer and fortunately is doing worse again at this point. This just since last week to this week has close to doubled in size according to the patient. I did not seeing last week's I do not have a visual to compare this to in our system was also down so we do not have all the charts and at this point. Nonetheless it does have me somewhat concerned in regard to the fact that again he was worried enough about it he has contact the dermatology that placed them back on the full strength, 50 mg a day of the prednisone that he was taken previous. He continues to alternate using clobetasol along with Santyl at this point. He is obviously somewhat frustrated. MIKI, BLANK ([id number]) 12/22/17 on evaluation today patient appears to be doing a little worse compared to last evaluation. Unfortunately the wound is a little deeper and slightly larger than the last week's evaluation. With that being said he has made some progress in regard to the irritation surrounding at this time unfortunately despite that progress that's been made he still has a significant issue going on here. 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 For that reason I discussed with him today the possibility of perform a wound culture to ensure there's nothing overtly infected. 01/06/18 on evaluation today patient's wound is larger and deeper than previously evaluated. With that being said it did appear that his wound was infected after my  last evaluation with  him. Subsequently I did end up prescribing a prescription for Bactrim DS which she has been taking and having no complication with. Fortunately there does not appear to be any evidence of infection at this point in time as far as anything spreading, no want to touch, and overall I feel like things are showing signs of improvement. 01/13/18 on evaluation today patient appears to be even a little larger and deeper than last time. There still muscle exposed in the base of the wound. Nonetheless he does appear to be less erythematous I do believe inflammation is calming down also believe the infection looks like it's probably resolved at this time based on what I'm seeing. No fevers, chills, nausea, or vomiting noted at this time. 01/30/18 on evaluation today patient actually appears to visually look better for the most part. Unfortunately those visually this looks better he does seem to potentially have what may be an abscess in the muscle that has been noted in the central portion of the wound. This is the first time that I have noted what appears to be fluctuance in the central portion of the muscle. With that being said I'm somewhat more concerned about the fact that this might indicate an abscess formation at this location. I do believe that an ultrasound would be appropriate. This is likely something we need to try to do as soon as possible. He has been switch to mupirocin ointment and he is no longer using the steroid ointment as prescribed by dermatology he sees them again next week he's been decreased from 60 to 40 mg of prednisone. 03/09/18 on evaluation today patient actually appears to be doing a little better compared to last time I saw him. There's not as much erythema surrounding the wound itself. He I did review his most recent infectious disease note which was dated 02/24/18. He saw Dr. Michel Bickers in [place]. 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 Patient is now off of antibiotics and they are continually observing this. There seems to be no change in the past two weeks in my pinion based on what the patient says and what I see today compared to what Dr. Megan Salon likely saw two weeks ago. No fevers, chills, nausea, or vomiting noted at this time. 03/23/18 on evaluation today patient's wound actually appears to be showing signs of improvement which is good news. He is currently still on the Dapsone. He is also working on tapering the prednisone to get off of this and Dr. Lottie Rater is working with him in this regard. Nonetheless overall I feel like the wound is doing well it does appear based on the infectious disease note that I reviewed from Dr. 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 I did also review the results of his ultrasound of left lower extremity which revealed there was a dentist tissue in the base of the wound without an abscess noted. 04/06/18 on evaluation today the patient's left lateral lower extremity ulcer actually appears to be doing fairly well which is excellent news. There does not appear to be any evidence of infection at this time which is also great news. Overall he still does have a significantly large ulceration although little by little he seems to be making progress. He is down to  10 mg a day of the prednisone. 04/20/18 on evaluation today patient actually appears to be doing excellent at this time in regard to his left lower extremity ulcer. He's making signs of good progress unfortunately this is taking much longer than we would really like to see but nonetheless he is making progress. Fortunately there does not appear to be any evidence of infection at this time. No fevers, chills, nausea, or vomiting noted at this time.  The patient has not been using the Santyl due to the cost he hadn't got in this field yet. He's mainly been using the antibiotic ointment topically. Subsequently he also tells me that he really has not been scrubbing in the shower I think this would be helpful again as I told him it doesn't have to be anything too aggressive to even make it believe just enough to keep it free of some of the loose slough and biofilm on the wound surface. 05/11/18 on evaluation today patient's wound appears to be making slow but sure progress in regard to the left lateral lower extremity ulcer. He is been tolerating the dressing changes without complication. Fortunately there does not appear to be any evidence of infection at this time. He is still just using triple antibiotic ointment along with clobetasol occasionally over the area. He never got the Santyl and really does not seem to intend to in my pinion. 06/01/18 on evaluation today patient appears to be doing a little better in regard to his left lateral lower extremity ulcer. He states that overall he does not feel like he is doing as well with the Dapsone as he did with the prednisone. Nonetheless he sees his dermatologist later today and is gonna talk to them about the possibility of going back on the prednisone. Overall again I believe that the wound would be better if you would utilize Santyl but he really does not seem to be interested in going back to the [place] at this point. He has been using triple antibiotic ointment. 06/15/18 on evaluation today patient's wound actually appears to be doing about the same at this point. Fortunately there is no signs of infection at this time. He has made slight improvements although he continues to not really want to clean the wound bed at this point. He states that he just doesn't mess with it he doesn't want to cause any problems with everything else he has going on. He has been on medication, antibiotics as prescribed  by his dermatologist, for a staff infection of his lower extremities which is really drying out now and looking much better he tells me. Fortunately there is no sign of overall infection. 06/29/18 on evaluation today patient appears to be doing well in regard to his left lateral lower surety ulcer all things considering. Fortunately his staff infection seems to be greatly improved compared to previous. He has no signs of infection and this is drying up quite nicely. He is still the doxycycline for this is no longer on cental, Dapsone, or any of the other medications. His dermatologist has recommended possibility of an infusion but right now he does not want to proceed with that. 07/13/18 on evaluation today patient appears to be doing about the same in regard to his left lateral lower surety ulcer. Fortunately there's no signs of infection at this time which is great news. Unfortunately he still builds up a significant amount of Slough/biofilm of the surface of the wound he still is not really cleaning this as  he should be appropriately. Again I'm able to easily with saline and gauze remove the majority of this on the surface which if you would do this at home would likely be a dramatic improvement for him as far as getting the area to improve. Nonetheless overall I still feel like he is making progress is just very slow. I think Santyl will be of benefit for him as well. Still he has not gotten this as of this point. 07/27/18 on evaluation today patient actually appears to be doing little worse in regards of the erythema around the periwound region of the wound he also tells me that he's been having more drainage currently compared to what he was experiencing last time I saw him. He states not quite as bad as what he had because this was infected previously but nonetheless is still appears to be doing poorly. Fortunately there is no evidence of systemic infection at this point. The patient tells me that  he is not going to be able to afford the Santyl. He is still waiting to hear about the infusion therapy with his dermatologist. Apparently she wants an updated colonoscopy first. [name], GODBEE ([id number]) 08/10/18 on evaluation today patient appears to be doing better in regard to his left lateral lower extremity ulcer. Fortunately he is showing signs of improvement in this regard he's actually been approved for Remicade infusion's as well although this has not been scheduled as of yet. Fortunately there's no signs of active infection at this time in regard to the wound although he is having some issues with infection of the right lower extremity is been seen as dermatologist for this. Fortunately they are definitely still working with him trying to keep things under control. 09/07/18 on evaluation today patient is actually doing rather well in regard to his left lateral lower extremity ulcer. He notes these actually having some hair grow back on his extremity which is something he has not seen in years. He also tells me that the pain is really not giving them any trouble at this time which is also good news overall she is very pleased with the progress he's using a combination of the mupirocin along with the probate is all mixed. 09/21/18 on evaluation today patient actually appears to be doing fairly well all things considered in regard to his looks from the ulcer. He's been tolerating the dressing changes without complication. Fortunately there's no signs of active infection at this time which is good news he is still on all antibiotics or prevention of the staff infection. He has been on prednisone for time although he states it is gonna contact his dermatologist and see if she put them on a short course due to some irritation that he has going on currently. Fortunately there's no evidence of any overall worsening this is going very slow I think cental would be something that would be helpful for him  although he states that $50 for tube is quite expensive. He therefore is not willing to get that at this point. 10/06/18 on evaluation today patient actually appears to be doing decently well in regard to his left lateral leg ulcer. He's been tolerating the dressing changes without complication. Fortunately there's no signs of active infection at this time. Overall I'm actually rather pleased with the progress he's making although it's slow he doesn't show any signs of infection and he does seem to be making some improvement. I do believe that he may need a switch up and dressings  to try to help this to heal more appropriately and quickly. 10/19/18 on evaluation today patient actually appears to be doing better in regard to his left lateral lower extremity ulcer. This is shown signs of having much less Slough buildup at this point due to the fact he has been using the [employer]. Obviously this is very good news. The overall size of the wound is not dramatically smaller but again the appearance is. 11/02/18 on evaluation today patient actually appears to be doing quite well in regard to his lower [place] ulcer. A lot of the skin around the ulcer is actually somewhat irritating at this point this seems to be more due to the dressing causing irritation from the adhesive that anything else. Fortunately there is no signs of active infection at this time. 11/24/18 on evaluation today patient appears to be doing a little worse in regard to his overall appearance of his lower extremity ulcer. There's more erythema and warmth around the wound unfortunately. He is currently on doxycycline which he has been on for some time. With that being said I'm not sure that seems to be helping with what appears to possibly be an acute cellulitis with regard to his left lower extremity ulcer. No fevers, chills, nausea, or vomiting noted at this time. 12/08/18 on evaluation today patient's wounds actually appears to be doing  significantly better compared to his last evaluation. He has been using Santyl along with alternating tripling about appointment as well as the steroid cream seems to be doing quite well and the wound is showing signs of improvement which is excellent news. Fortunately there's no evidence of infection and in fact his culture came back negative with only normal skin flora noted. 12/21/2018 upon evaluation today patient actually appears to be doing excellent with regard to his ulcer. This is actually the best that I have seen it since have been helping to take care of him. It is both smaller as well as less slough noted on the surface of the wound and seems to be showing signs of good improvement with new skin growing from the edges. He has been using just the triamcinolone he does wonder if he can get a refill of that ointment today. 01/04/2019 upon evaluation today patient actually appears to be doing well with regard to his left lateral lower extremity ulcer. With that being said it does not appear to be that he is doing quite as well as last time as far as progression is concerned. There does not appear to be any signs of infection or significant irritation which is good news. With that being said I do believe that he may benefit from switching to a collagen based dressing based on how clean The wound appears. 01/18/2019 on evaluation today patient actually appears to be doing well with regard to his wound on the left lower extremity. He is not made a lot of progress compared to where we were previous but nonetheless does seem to be doing okay at this time which is good news. There is no signs of active infection which is also good news. My only concern currently is I do wish we can get him into utilizing the collagen dressing his insurance would not pay for the supplies that we ordered although it appears that he may be able to order this through his supply company that he typically utilizes. This is  [employer]. Nonetheless he did try to order it during the office visit today and it appears this did  go through. We will see if he can get that it is a different brand but nonetheless he has collagen and I do think will be beneficial. 02/01/2019 on evaluation today patient actually appears to be doing a little worse today in regard to the overall size of his wounds. Fortunately there is no signs of active infection at this time. That is visually. Nonetheless when this is happened before it was due to infection. For that reason were somewhat concerned about that this time as well. 02/08/2019 on evaluation today patient unfortunately appears to be doing slightly worse with regard to his wound upon evaluation today. Is measuring a little deeper and a little larger unfortunately. I am not really sure exactly what is causing this to enlarge he actually did see his dermatologist she is going to see about initiating Humira for him. Subsequently she also did do steroid injections into the wound itself in the periphery. Nonetheless still nonetheless he seems to be getting a little bit larger he is gone back to just using the steroid cream topically which I think is appropriate. I would say hold off on the collagen for the time being is definitely a good thing to do. Based on the culture results which we finally did get the final result back regarding it shows staph as the bacteria noted again that can be a normal skin bacteria based on the fact however he is having increased drainage and worsening of the wound measurement wise I would go ahead and place him on an antibiotic today I do believe for this. 02/15/2019 on evaluation today patient actually appears to be doing somewhat better in regard to his ulcer. There is no signs of worsening at this time I did review his culture results which showed evidence of Staphylococcus aureus but not MRSA. Again this could just be more related to the normal skin bacteria  although he states the drainage has slowed down quite a bit he may have had a mild infection not just colonization. And was much smaller and then since around10/04/2019 on evaluation today patient appears to be doing unfortunately worse as far as the size of the wound. I really feel like that this is steadily getting larger again it had been doing excellent right at the beginning of September we have seen a steady increase in the area of the wound it is almost 2-1/2 times the size it was on September 1. Obviously this is a bad trend this is not wanting to see. For that reason we went back to using just the topical triamcinolone cream which does seem to help with inflammation. I checked him for bacteria by way of culture and nothing showed positive there. I am considering giving him a short course of a tapering steroid Dawan Farney, Fady ([id number]) today to see if that is can be beneficial for him. The patient is in agreement with giving that a try. 03/08/2019 on evaluation today patient appears to be doing very well in comparison to last evaluation with regard to his lower extremity ulcer. This is showing signs of less inflammation and actually measuring slightly smaller compared to last time every other week over the past month and a half he has been measuring larger larger larger. Nonetheless I do believe that the issue has been inflammation the prednisone does seem to have been beneficial for him which is good news. No fevers, chills, nausea, vomiting, or diarrhea. 03/22/2019 on evaluation today patient appears to be doing about the same with regard  to his leg ulcer. He has been tolerating the dressing changes without complication. With that being said the wound seems to be mostly arrested at its current size but really is not making any progress except for when we prescribed the prednisone. He did show some signs of dropping as far as the overall size of the wound during that interval week.  Nonetheless this is something he is not on long-term at this point and unfortunately I think he is getting need either this or else the Humira which his dermatologist has discussed try to get approval for. With that being said he will be seeing his dermatologist on the 11th of this month that is November. 04/19/2019 on evaluation today patient appears to be doing really about the same the wound is measuring slightly larger compared to last time I saw him. He has not been into the office since November 2 due to the fact that he unfortunately had Covid as that his entire family. He tells me that it was rough but they did pull-through and he seems to be doing much better. Fortunately there is no signs of active infection at this time. No fevers, chills, nausea, vomiting, or diarrhea. 05/10/2019 on evaluation today patient unfortunately appears to be doing significantly worse as compared to last time I saw him. He does tell me that he has had his first dose of Humira and actually is scheduled to get the next one in the upcoming week. With that being said he tells me also that in the past several days he has been having a lot of issues with green drainage she showed me a picture this is more blue-green in color. He is also been having issues with increased sloughy buildup and the wound does appear to be larger today. Obviously this is not the direction that we want everything to take based on the starting of his Humira. Nonetheless I think this is definitely a result of likely infection and to be honest I think this is probably Pseudomonas causing the infection based on what I am seeing. 05/24/2019 on evaluation today patient unfortunately appears to be doing significantly worse compared to his prior evaluation with me 2 weeks ago. I did review his culture results which showed that he does have Staph aureus as well as Pseudomonas noted on the culture. Nonetheless the Levaquin that I prescribed for him does  not appear to have been appropriate and in fact he tells me he is no longer experiencing the green drainage and discharge that he had at the last visit. Fortunately there is no signs of active infection at this time which is good news although the wound has significantly worsened it in fact is much deeper than it was previous. We have been utilizing up to this point triamcinolone ointment as the prescription topical of choice but at this time I really feel like that the wound is getting need to be packed in order to appropriately manage this due to the deeper nature of the wound. Therefore something along the lines of an alginate dressing may be more appropriate. 05/31/2019 upon inspection today patient's wound actually showed signs of doing poorly at this point. Unfortunately he just does not seem to be making any good progress despite what we have tried. He actually did go ahead and pick up the Cipro and start taking that as he was noticing more green drainage he had previously completed the Levaquin that I prescribed for him as well. Nonetheless he missed his appointment  for the seventh last week on Wednesday with the wound care center and [facility] where his dermatologist referred him. Obviously I do think a second opinion would be helpful at this point especially in light of the fact that the patient seems to be doing so poorly despite the fact that we have tried everything that I really know how at this point. The only thing that ever seems to have helped him in the past is when he was on high doses of continual steroids that did seem to make a difference for him. 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 06/07/2019 upon evaluation today patient unfortunately appears to be doing worse yet again with regard to his wound. In fact I am  starting to question whether or not he may have a fluid pocket in the muscle at this point based on the bulging and the soft appearance to the central portion of the muscle area. There is not anything draining from the muscle itself at this time which is good news but nonetheless the wound is expanding. I am not really seeing any results of the Humira as far as overall wound progression based on what I am seeing at this point. The patient has been referred for second opinion with regard to his wound to the [facility] wound care center by his dermatologist which I definitely am not in opposition to. Unfortunately we tried multiple dressings in the past including collagen, alginate, and at one point even Hydrofera Blue. With that being said he is never really used it for any significant amount of time due to the fact that he often complains of pain associated with these dressings and then will go back to either using the Santyl which she has done intermittently or more frequently the triamcinolone. He is also using his own compression stockings. We have wrapped him in the past but again that was something else that he really was not a big fan of. 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 He seemed to do much better when he was on the steroids although honestly I understand that the steroids are not necessarily the best medication to be on long-term obviously 06/14/2019 on evaluation today patient appears to be doing actually a little bit better with regard to the overall appearance with his leg. Unfortunately he does continue to have issues with what appears to be some fluid underneath the  muscle although he did see the wound specialty center at [facility] last week their main goals were to see about infusion therapy in place of the Humira as they feel like that is not quite strong enough. They also recommended that we continue with the treatment otherwise as we are they felt like that was appropriate and they are okay with him continuing to follow-up here with [place] in that regard. With that being said they are also sending him to the vein specialist there to see about vein stripping and if that  would be of benefit for him. Subsequently they also did not really address whether or not an ultrasound of the muscle area to see if there is anything that needs to be addressed here would be appropriate or not. For that reason I discussed this with him last week I think we may proceed down that road at this point. 06/21/2019 upon evaluation today patient's wound actually appears to be doing slightly better compared to previous evaluations. I do believe that he has made a difference with regard to the progression here with the use of oral steroids. Again in the past has been the only thing that is really calm things down. He does tell me that from [facility] is gotten a good news from there that there are no further vein stripping that is necessary at this point. I do not have that available for review today although the patient did relay this to me. He also did obtain and have the ultrasound of the wound completed which I did sign off on today. It does appear that there is no fluid collection under the muscle this is likely then just edematous tissue in general. That is also good news. Overall I still believe the inflammation is the main issue here. He did inquire about the possibility of a wound VAC again with the muscle protruding like it is I am not really sure whether the wound VAC is necessarily ideal or not. That is something we will have to consider although I do believe he may need compression wrapping to  try to help with edema control which could potentially be of benefit. 06/28/2019 on evaluation today patient appears to be doing slightly better measurement wise although this is not terribly smaller he least seems to be trending towards that direction. With that being said he still seems to have purulent drainage noted in the wound bed at this time. He has been on Levaquin followed by Cipro over the past month. Unfortunately he still seems to have some issues with active infection at this time. I did perform a culture last week in order to evaluate and see if indeed there was still anything going on. Subsequently the culture did come back Guisinger, Harvie ([id number]) showing Pseudomonas which is consistent with the drainage has been having which is blue-green in color. He also has had an odor that again was somewhat consistent with Pseudomonas as well. Long story short it appears that the culture showed an intermediate finding with regard to how well the Cipro will work for the Pseudomonas infection. Subsequently being that he does not seem to be clearing up and at best what we are doing is just keeping this at [place] I think he may need to see infectious disease to discuss IV antibiotic options. 07/05/2019 upon evaluation today patient appears to be doing okay in regard to his leg ulcer. He has been tolerating the dressing changes at this point without complication. Fortunately there is no signs of active infection at this time which is good news. No fevers, chills, nausea, vomiting, or diarrhea. With that being said he does have an appointment with infectious disease tomorrow and his primary care on Wednesday. Again the reason for the infectious disease referral was due to the fact that he did not seem to be fully resolving with the use of oral antibiotics and therefore we were thinking that IV antibiotic therapy may be necessary secondary to the fact that there was an intermediate finding for  how effective the Cipro may be.  Nonetheless again he has been having a lot of purulent and even green drainage. Fortunately right now that seems to have calmed down over the past week with the reinitiation of the oral antibiotic. Nonetheless we will see what Dr. Megan Salon has to say. 07/12/2019 upon evaluation today patient appears to be doing about the same at this point in regard to his left lower extremity ulcer. Fortunately there is no signs of active infection at this time which is good news I do believe the Levaquin has been beneficial I did review Dr. Hale Bogus note and to be honest I agree that the patient's leg does appear to be doing better currently. What we found in the past as he does not seem to really completely resolve he will stop the antibiotic and then subsequently things will revert back to having issues with blue-green drainage, increased pain, and overall worsening in general. Obviously that is the reason I sent him back to infectious disease. 07/19/2019 upon evaluation today patient appears to be doing roughly the same in size there is really no dramatic improvement. He has started back on the Levaquin at this point and though he seems to be doing okay he did still have a lot of blue/green drainage noted on evaluation today unfortunately. I think that this is still indicative more likely of a Pseudomonas infection as previously noted and again he does see Dr. Megan Salon in just a couple of days. I do not know that were really able to effectively clear this with just oral antibiotics alone based on what I am seeing currently. Nonetheless we are still continue to try to manage as best we can with regard to the patient and his wound. I do think the wrap was helpful in decreasing the edema which is excellent news. No fevers, chills, nausea, vomiting, or diarrhea. 07/26/2019 upon evaluation today patient appears to be doing slightly better with regard to the overall appearance of the muscle  there is no dark discoloration centrally. Fortunately there is no signs of active infection at this time. No fevers, chills, nausea, vomiting, or diarrhea. Patient's wound bed currently the patient did have an appointment with Dr. Megan Salon at [facility] last week. With that being said Dr. Megan Salon the patient states was still somewhat hesitant about put him on any IV antibiotics he wanted [place] to repeat cultures today and then see where things go going forward. He does look like Dr. Megan Salon because of some improvement the patient did have with the Levaquin wanted [place] to see about repeating cultures. If it indeed grows the Pseudomonas again then he recommended a possibility of considering a PICC line placement and IV antibiotic therapy. He plans to see the patient back in 1 to 2 weeks. 08/02/2019 upon evaluation today patient appears to be doing poorly with regard to his left lower extremity. We did get the results of his culture back it shows that he is still showing evidence of Pseudomonas which is consistent with the purulent/blue-green drainage that he has currently. Subsequently the culture also shows that he now is showing resistance to the oral fluoroquinolones which is unfortunate as that was really the only thing to treat the infection prior. I do believe that he is looking like this is going require IV antibiotic therapy to get this under control. Fortunately there is no signs of systemic infection at this time which is good news. The patient does see Dr. Megan Salon tomorrow. 08/09/2019 upon evaluation today patient appears to be doing better with regard  to his left lower extremity ulcer in regard to the overall appearance. He is currently on IV antibiotic therapy. As ordered by Dr. Megan Salon. Currently the patient is on ceftazidime which she is going to take for the next 2 weeks and then follow-up for 4 to 5-week appointment with Dr. Megan Salon. The patient started this this past Friday  symptoms have not for a total of 3 days currently in full. 08/16/2019 upon evaluation today patient's wound actually does show muscle in the base of the wound but in general does appear to be much better as far as the overall evidence of infection is concerned. In fact I feel like this is for the most part cleared up he still on the IV antibiotics he has not completed the full course yet but I think he is doing much better which is excellent news. 08/23/2019 upon evaluation today patient appears to be doing about the same with regard to his wound at this point. He tells me that he still has pain unfortunately. Fortunately there is no evidence of systemic infection at this time which is great news. There is significant muscle protrusion. 09/13/19 upon evaluation today patient appears to be doing about the same in regard to his leg unfortunately. He still has a lot of drainage coming from the ulceration there is still muscle exposed. With that being said the patient's last wound culture still showed an intermediate finding with regard to the Pseudomonas he still having the bluish/green drainage as well. Overall I do not know that the wound has completely cleared of infection at this point. Fortunately there is no signs of active infection systemically at this point which is good news. 09/20/2019 upon evaluation today patient's wound actually appears to be doing about the same based on what I am seeing currently. I do not see any signs of systemic infection he still does have evidence of some local infection and drainage. He did see Dr. Megan Salon last week and Dr. Megan Salon states that he probably does need a different IV antibiotic although he does not want to put him on this until the patient begins the Remicade infusion which is actually scheduled for about 10 days out from today on 13 May. Following that time Dr. Megan Salon is good to see him back and then will evaluate the feasibility of starting him on the  IV antibiotic therapy once again at that point. I do not disagree with this plan I do believe as Dr. Megan Salon stated in his note that I reviewed today that the patient's issue is multifactorial with the pyoderma being 1 aspect of this that were hoping the Remicade will be helpful for her. In the meantime I think the gentamicin is, helping to keep things under decent okay control in regard to the ulcer. 09/27/2019 upon evaluation today patient appears to be doing about the same with regard to his wound still there is a lot of muscle exposure though he does have some hyper granulation tissue noted around the edge and actually some granulation tissue starting to form over the muscle which is actually good news. Fortunately there is no evidence of active infection which is also good news. His pain is less at this point. 5/21; this is a patient I have not seen in a long time. 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 He has a large wound on the left lateral leg with protruding muscle. He comes in the clinic today showing the same area on his left  medial ankle. He says there is been a spot there for some time although we have not previously defined this. Today he has a clearly defined area with slight amount of skin breakdown surrounded by raised areas with a purplish hue in color. This is not painful he says it is irritated. 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 He still has muscle protruding from the lateral portion of his left leg but fortunately the new area that they were concerned about at his last visit does not appear to have opened at this point. He is currently on Remicade infusions and seems to be doing better in my opinion in fact the wound itself seems to be overall much better. The purplish discoloration that he did have seems to have resolved  and I think that is a good sign that hopefully the Remicade is doing its job. He does Durnil, Sonia ([id number]) have some biofilm noted over the surface of the wound. 11/01/2019 on evaluation today patient's wound actually appears to be doing excellent at this time. Fortunately there is no evidence of active infection and overall I feel like he is making great progress. The Remicade seems to be due excellent job in my opinion. 11/08/19 evaluation today vision actually appears to be doing quite well with regard to his weight ulcer. He's been tolerating dressing changes without complication. Fortunately there is no evidence of infection. No fevers, chills, nausea, or vomiting noted at this time. Overall states that is having more itching than pain which is actually a good sign in my opinion. 12/13/2019 upon evaluation today patient appears to be doing well today with regard to his wound. He has been tolerating the dressing changes without complication. Fortunately there is no sign of active infection at this time. No fevers, chills, nausea, vomiting, or diarrhea. Overall I feel like the infusion therapy has been very beneficial for him. 01/06/2020 on evaluation today patient appears to be doing well with regard to his wound. This is measuring smaller and actually looks to be doing better. Fortunately there is no signs of active infection at this point. No fevers, chills, nausea, vomiting, or diarrhea. With that being said he does still have the blue-green drainage but this does not seem to be causing any significant issues currently. He has been using the gentamicin that does seem to be keeping things under decent control at this point. He goes later this morning for his next infusion therapy for the pyoderma which seems to also be very beneficial. 02/07/2020 on evaluation today patient appears to be doing about the same in regard to his wounds currently. Fortunately there is no signs of active infection  systemically he does still have evidence of local infection still using gentamicin. He also is showing some signs of improvement albeit slowly I do feel like we are making some progress here. 02/21/2020 upon evaluation today patient appears to be making some signs of improvement the wound is measuring a little bit smaller which is great news and overall I am very pleased with where he stands currently. He is going to be having infusion therapy treatment on the 15th of this month. Fortunately there is no signs of active infection at this time. 03/13/2020 I do believe patient's wound is actually showing some signs of improvement here which is great news. He has continue with the infusion therapy through rheumatology/dermatology at [facility]. That does seem to be beneficial.  I still think he gets as much benefit from this as he did from the prednisone initially but nonetheless obviously this is less harsh on his body that the prednisone as far as they are concerned. 03/31/2020 on evaluation today patient's wound actually showing signs of some pretty good improvement in regard to the overall appearance of the wound bed. There is still muscle exposed though he does have some epithelial growth around the edges of the wound. Fortunately there is no signs of active infection at this time. No fevers, chills, nausea, vomiting, or diarrhea. 04/24/2020 upon evaluation today patient appears to be doing about the same in regard to his leg ulcer. He has been tolerating the dressing changes without complication. Fortunately there is no signs of active infection at this time. No fevers, chills, nausea, vomiting, or diarrhea. With that being said he still has a lot of irritation from the bandaging around the edges of the wound. We did discuss today the possibility of a referral to plastic surgery. 05/22/2020 on evaluation today patient appears to be doing well with regard to his wounds all things considered. He has not been able  to get the Chantix apparently there is a recall nurse that I was unaware of put out by [employer] involuntarily. Nonetheless for now I am and I have to do some research into what may be the best option for him to help with quitting in regard to smoking and we discussed that today. 06/26/2020 upon evaluation today patient appears to be doing well with regard to his wound from the standpoint of infection I do not see any signs of infection at this point. With that being said unfortunately he is still continuing to have issues with muscle exposure and again he is not having a whole lot of new skin growth unfortunately. There does not appear to be any signs of active infection at this time. No fevers, chills, nausea, vomiting, or diarrhea. 07/10/2020 upon evaluation today patient appears to be doing a little bit more poorly currently compared to where he was previous. I am concerned currently about an active infection that may be getting worse especially in light of the increased size and tenderness of the wound bed. No fevers, chills, nausea, vomiting, or diarrhea. 07/24/2020 upon evaluation today patient appears to be doing poorly in regard to his leg ulcer. He has been tolerating the dressing changes without complication but unfortunately is having a lot of discomfort. 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 Subsequently I think he is going require possibly IV antibiotics to get this under control. I am very concerned about the severity of his infection and the amount of discomfort he is having. 07/31/2020 upon evaluation today patient appears to be doing about the same in regard to his leg wound. He did see Dr. Megan Salon and Dr. Megan Salon is actually going to start him on IV antibiotics. He goes for the PICC line tomorrow. With that being said there do not have that run for 2 weeks and then see how things are doing and depending on how he  is progressing they may extend that a little longer. Nonetheless I am glad this is getting ready to be in place and definitely feel it may help the patient. In the meantime is been using mainly triamcinolone to the wound bed has an anti-inflammatory. 08/07/2020 on evaluation today patient appears to be doing well with regard to his wound compared even last week. In the  interim he has gotten the PICC line placed and overall this seems to be doing excellent. There does not appear to be any evidence of infection which is great news systemically although locally of course has had the infection this appears to be improving with the use of the antibiotics. 08/14/2020 upon evaluation today patient's wound actually showing signs of excellent improvement. Overall the irritation has significantly improved the drainage is back down to more of a normal level and his pain is really pretty much nonexistent compared to what it was. Obviously I think that this is significantly improved secondary to the IV antibiotic therapy which has made all the difference in the world. Again he had a resistant form of Pseudomonas for which oral antibiotics just was not cutting it. Nonetheless I do think that still we need to consider the possibility of a surgical closure for this wound is been open so long and to be honest with muscle exposed I think this can be very hard to get this to close outside of this although definitely were still working to try to do what we can in that regard. 08/21/2020 upon evaluation today patient appears to be doing very well with regard to his wounds on the left lateral lower extremity/calf area. Fortunately there does not appear to be signs of active infection which is great news and overall very pleased with where things stand today. He is actually wrapping up his treatment with IV antibiotics tomorrow. After that we will see where things go from there. 08/28/2020 upon evaluation today patient appears  to be doing decently well with regard to his leg ulcer. There does not appear to be any signs of Limas, Chavis ([id number]) active infection which is great news and overall very pleased with where things stand today. No fevers, chills, nausea, vomiting, or diarrhea. 09/18/2020 upon evaluation today patient appears to be doing well with regard to his infection which I feel like is better. Unfortunately he is not doing as well with regard to the overall size of the wound which is not nearly as good at this point. I feel like that he may be having an issue here with the pyoderma being somewhat out of control. I think that he may benefit from potentially going back and talking to the dermatologist about what to do from the pyoderma standpoint. I am not certain if the infusions are helping nearly as much is what the prednisone did in the past. 10/02/2020 upon evaluation today patient appears to be doing well with regard to his leg ulcer. He did go to the Psychiatric nurse. Unfortunately they feel like there is a 10% chance that most that he would be able to heal and that the skin graft would take. Obviously this has led him to not be able to go down that path as far as treatment is concerned. Nonetheless he does seem to be doing a little bit better with the prednisone that I gave him last time. I think that he may need to discuss with dermatology the possibility of long-term prednisone as that seems to be what is most helpful for him to be perfectly honest. I am not sure the Remicade is really doing the job. 10/17/2020 upon evaluation today patient appears to be doing a little better in regard to his wound. In fact the case has been since we did the prednisone on May 2 for him that we have noticed a little bit of improvement each time we have seen a size wise  as well as appearance wise as well as pain wise. I think the prednisone has had a greater effect then the infusion therapy has to be perfectly honest. With  that being said the patient also feels significantly better compared to what he was previous. All of this is good news but nonetheless I am still concerned about the fact that again we are really not set up to long-term manage him as far as prednisone is concerned. Obviously there are things that you need to be watched I completely understand the risk of prednisone usage as well. 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 With all that being said I do believe that we can give him another round of the prednisone which she is requesting today because of the improvement that he seen since we did that first round. 10/30/2020 upon evaluation today patient's wound actually is showing signs of doing quite well. There does not appear to be any evidence of infection which is great news and overall very pleased with where things stand today. No fevers, chills, nausea, vomiting, or diarrhea. He tells me that the prednisone still has seem to have helped he wonders if we can extend that for just a little bit longer. He did not have the appointment with a dermatologist although he did have an infusion appointment last Friday. That was at [facility]. With that being said he tells me he could not do both that as well as the appointment with the physician on the same day therefore that is can have to be rescheduled. I really want to see if there is anything they feel like that could be done differently to try to help this out as I am not really certain that the infusions are helping significantly here. 11/13/2020 upon evaluation today patient unfortunately appears to be doing somewhat poorly in regard to his wound I feel like this is actually worsening from the standpoint of the pyoderma spreading. I still feel like that he may need something different as far as trying to manage this going forward. Again we did the prednisone unfortunately his blood sugars are not doing so well  because of this. Nonetheless I believe that the patient likely needs to try topical steroid. We have done triamcinolone for a while I think going with something stronger such as clobetasol could be beneficial again this is not something I do lightly I discussed this with the patient that again this does not normally put underneath an occlusive dressing. Nonetheless I think a thin film as such could help with some of the stronger anti-inflammatory effects. We discussed this today. He would like to try to give this a trial for the next couple weeks. I definitely think that is something that we can do. Evaluate7/03/2021 and today patient's wound bed actually showed signs of doing really about the same. There was a little expansion of the size of the wound and that leading edge that we done looking out although the clobetasol does seem to have slowed this down a bit in my opinion. There is just 1 small area that still seems to be progressing based on what I see. Nonetheless I am concerned about the fact this does not seem to be improving if anything seems to be doing a little bit worse. I do not know that the infusions are really helping him much as next infusion is August 5 his appointment with dermatology is July 25. Either way I  really think that we need to have a conversation potentially about this and I am actually going to see if I can talk with Dr. Lillia Carmel in order to see where things stand as well. 12/11/2020 upon evaluation today patient appears to be doing worse in regard to his leg ulcer. Unfortunately I just do not think this is making the progress that I would like to see at this point. Honestly he does have an appointment with dermatology and this is in 2 days. I am wondering what they may have to offer to help with this. Right now what I am seeing is that he is continuing to show signs of worsening little by little. Obviously that is not great at all. Is the exact opposite of what we are  looking for. 12/18/2020 upon evaluation today patient appears to be doing a little better in regard to his wound. The dermatologist actually did do some steroid injections into the wound which does seem to have been beneficial in my opinion. That was on the 25th already this looks a little better to me than last time I saw him. With that being said we did do a culture and this did show that he has Staph aureus noted in abundance in the wound. With that being said I do think that getting him on an oral antibiotic would be appropriate as well. Also think we can compression wrap and this will make a difference as well. 12/28/2020 upon evaluation today patient's wound is actually showing signs of doing much better. I do believe the compression wrap is helping he has a lot of drainage but to be honest I think that the compression is helping to some degree in this regard as well as not draining through which is also good news. No fevers, chills, nausea, vomiting, or diarrhea. 01/04/2021 upon evaluation today patient appears to be doing well with regard to his wound. Overall things seem to be doing quite well. He did have a little bit of reaction to the CarboFlex Sorbact he will be using that any longer. With that being said he is controlled as far as the drainage is concerned overall and seems to be doing quite well. I do not see any signs of active infection at this time which is great news. No fevers, chills, nausea, vomiting, or diarrhea. 01/11/2021 upon evaluation today patient appears to be doing well with regard to his wounds. He has been tolerating the dressing changes without complication. Fortunately there does not appear to be any signs of active infection at this time which is great news. Overall I am extremely pleased with where we stand currently. No fevers, chills, nausea, vomiting, or diarrhea. Where using clobetasol in the wound bed he has a lot of new skin growth which is awesome as  well. 01/18/2021 upon evaluation today patient appears to be doing very well in regard to his leg ulcer. He has been tolerating the dressing changes without complication. Fortunately there does not appear to be any signs of active infection which is great news. In general I think that he is making excellent progress 01/25/2021 upon evaluation today patient appears to be doing well with regard to his wound on the leg. I am actually extremely pleased with where things stand today. There does not appear to be any signs of active infection which is great news and overall I think that we are definitely headed in the appropriate direction based on what I am seeing currently. There does not appear to  be any signs of active infection also excellent news. BYFORD, SCHOOLS ([id number]) Objective Constitutional Obese and well-hydrated in no acute distress. Vitals Time Taken: 8:14 AM, Height: 71 in, Weight: 338 lbs, BMI: 47.1, Temperature: 98.8 F, Pulse: 90 bpm, Respiratory Rate: 16 breaths/min, Blood Pressure: 133/79 mmHg. Respiratory normal breathing without difficulty. Psychiatric this patient is able to make decisions and demonstrates good insight into disease process. Alert and Oriented x 3. pleasant and cooperative. General Notes: Patient's wound bed showed signs of great granulation epithelization at this point. I think were making wonderful progress and overall I am extremely pleased with where we stand today. There does not appear to be any signs of active infection and I am very happy in that regard overall I think we are moving towards complete closure. Integumentary (Hair, Skin) Wound #1 status is Open. Original cause of wound was Gradually Appeared. The date acquired was: 11/18/2015. The wound has been in treatment 216 weeks. The wound is located on the Left,Lateral Lower Leg. The wound measures 6cm length x 6cm width x 0.6cm depth; 28.274cm^2 area and 16.965cm^3 volume. There is muscle and Fat Layer  (Subcutaneous Tissue) exposed. There is undermining starting at 2:00 and ending at 3:00 with a maximum distance of 0.4cm. There is additional undermining and at 6:00 and ending at 7:00 with a maximum distance of 0.5cm. There is a large amount of serosanguineous drainage noted. There is large (67-100%) red, pink granulation within the wound bed. There is a small (1-33%) amount of necrotic tissue within the wound bed including Adherent Slough and Necrosis of Muscle. 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 There was a Four Layer Compression Therapy Procedure by Donnamarie Poag, RN. Post procedure Diagnosis Wound #1: Same as Pre-Procedure Plan Follow-up Appointments: Return Appointment in 1 week. - Next Thursday-(wrap twice weekly) Nurse Visit as needed - Monday Bathing/ Shower/ Hygiene: Clean wound with Normal Saline or wound cleanser. SHANKAR, SILBER ([id number]) Edema Control - Lymphedema / Segmental Compressive Device / Other: Optional: One layer of unna paste to top of compression wrap (to act as an anchor). 4 Layer Compression System Lymphedema. Medications-Please add to medication list.: P.O. Antibiotics WOUND #1: - Lower Leg Wound Laterality: Left, Lateral Cleanser: Soap and Water 2 x Per Week/30 Days Discharge Instructions: Gently cleanse wound with antibacterial soap, rinse and pat dry prior to dressing wounds Peri-Wound Care: Desitin Maximum Strength Ointment 4 (oz) 2 x Per Week/30 Days Peri-Wound Care: Triamcinolone Acetonide Cream, 0.1%, 15 (g) tube 2 x Per Week/30 Days Discharge  Instructions: Apply on posterior leg Topical: Clobetasol Propionate ointment 0.05%, 60 (g) tube 2 x Per Week/30 Days Discharge Instructions: Apply in undermining area and reddened area on posterior leg Primary Dressing: Calcium Alginate Dressing, 6x6 (in/in) 2 x Per Week/30 Days Discharge Instructions: Apply to wound bed Secondary Dressing: Xtrasorb Large 6x9 (in/in) 2 x Per Week/30 Days Discharge Instructions: Apply to wound as directed. Do not cut. Compression Wrap: Medichoice 4 layer Compression System, 35-40 mmHG (Generic) 2 x Per Week/30 Days Discharge Instructions: Apply multi-layer wrap as directed. Add-Ons: CarboFLEX Odor Control Dressing, 4x4 (in/in) 2 x Per Week/30 Days Discharge Instructions: PLEASE APPLY OVER THE XSORB DO NOT APPLY ON  SKIN Place the fibrous (non-shiny) surface of the dressing directly onto the wound for management of malodorous wounds. 1. Would recommend currently that we go ahead and continue with the wound care measures as before and specifically this includes the use of the clobetasol topically followed by the alginate dressing. 2. I am also can recommend that we continue with the 4-layer compression wrap which I think is doing a great job for the patient currently. 3. I am also can recommend that we have him use the XtraSorb followed by CarboFlex to help with drainage and control of the odor. We will see patient back for reevaluation in 1 week here in the clinic. If anything worsens or changes patient will contact our office for additional recommendations. Electronic Signature(s) Signed: 01/25/2021 10:20:14 AM By: Worthy Keeler PA-C Entered By: Worthy Keeler on 01/25/2021 10:20:14 [name] ([id number]) -------------------------------------------------------------------------------- SuperBill Details Patient Name: [name] Date of Service: 01/25/2021 Medical Record Number: [id number] Patient Account Number: [id number] Date of Birth/Sex: November 28, 1980 (40  y.o. M) Treating RN: Donnamarie Poag Primary Care Provider: [name] Other Clinician: Referring Provider: [name] Treating Provider/Extender: [name] Cliche in Treatment: 216 Diagnosis Coding ICD-10 Codes Code Description [phone] Non-pressure chronic ulcer of left calf with fat layer exposed L88 Pyoderma gangrenosum L97.321 Non-pressure chronic ulcer of left ankle limited to breakdown of skin I87.2 Venous insufficiency (chronic) (peripheral) L03.116 Cellulitis of left lower limb F17.208 Nicotine dependence, unspecified, with other nicotine-induced disorders Facility Procedures CPT4 Code: 42706237 Description: (Facility Use Only) [phone] - [place] LWR LT LEG Modifier: Quantity: 1 Physician Procedures CPT4 Code: 7616073 Description: 71062 - WC PHYS LEVEL 3 - EST PT Modifier: Quantity: 1 CPT4 Code: Description: ICD-10 Diagnosis Description L97.222 Non-pressure chronic ulcer of left calf with fat layer exposed L88 Pyoderma gangrenosum L97.321 Non-pressure chronic ulcer of left ankle limited to breakdown of skin I87.2 Venous insufficiency (chronic)  (peripheral) Modifier: Quantity: Electronic Signature(s) Signed: 01/25/2021 10:20:31 AM By: Worthy Keeler PA-C Entered By: Worthy Keeler on 01/25/2021 10:20:30

## 2021-01-26 ENCOUNTER — Other Ambulatory Visit: Payer: Self-pay | Admitting: Primary Care

## 2021-01-26 DIAGNOSIS — Z720 Tobacco use: Secondary | ICD-10-CM

## 2021-01-26 DIAGNOSIS — E119 Type 2 diabetes mellitus without complications: Secondary | ICD-10-CM

## 2021-01-28 NOTE — Telephone Encounter (Signed)
Does he have enough Lantus insulin? I changed the Rx in July to 40 units and received a refill request for 30 units. This may be an old Rx but just wanted to make sure.  Okay to fill the Bupropion as pended. Remind him of his appt in October.

## 2021-01-29 NOTE — Telephone Encounter (Signed)
Aurto refill not needed.

## 2021-01-30 ENCOUNTER — Other Ambulatory Visit: Payer: Self-pay

## 2021-01-30 DIAGNOSIS — L080 Pyoderma: Secondary | ICD-10-CM | POA: Diagnosis not present

## 2021-01-30 NOTE — Unmapped (Unsigned)
Dermatology Note     Assessment and Plan     Chronic ulcer of left lower extremity, multifactorial etiology in the setting of propensity for PG (in context of ulcerative colitis) and likely venous disease (with evidence of swelling/venous stasis) with history of recurrent infections. 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 FLARING w/ progression.  Chronic illness with exacerbation, active but slowly improving  - increase to infliximab 10 mg/kg every 6 weeks.  Updated orders in media - will have clinical staff fax it over.  -Continue follow-up with wound care every 2 weeks  -Continue topical gentamicin and alone daily to wound with bandage changes  -consider methotrexate, cyclosporine, abx (clinda/rifampin), ivig, ertapenem  -After the patient was informed of risks, benefits and side effects of intralesional steroid injection, the patient elected to undergo injection. Informed verbal consent was obtained. Risk of atrophy  and dyspigmentation with injection was explained. Kenalog 40 mg/ml was injected locally into the sites located L leg in a clean fashion following alcohol prep.   Total volume in ml=1.  Number of sites treated: 6   Wound care was explained to the patient  ??  Plan for his PG:  -will increase his avsola to 10mg /kg every 6 weeks (from 5 mg/kg every 8 weeks which was what was being administered)  -patient improves with ILK - he will message if he needs it and I can overbook him  -cont to follow with ID and wound - started doxycycline recently due to staph.    High risk medication use: infliximab  - quant gold unremarkable 2/22  - reviewed CBC and bun/cr, ast/alt 06/2020 - stable but with slight elevation of AST and ALT, recheck today  - hepatitis panel (HBV cAb / sAb and HCV) negative 07/2019 -- HBV non-immune  -repeat cbc, lfts, mg, k, lipid panel    The patient was advised to call for an appointment should any new, changing, or symptomatic lesions develop.     RTC: No follow-ups on file. or sooner as needed     Chief Complaint     No chief complaint on file.      HPI     Andrew Cameron is a 40 y.o. male who presents as a {mmcdermpttype:88281::new patient to} [facility] for {mmcskincheckhpi:88282::***.} ***    Plan for his PG:  -will increase his avsola to 10mg /kg every 6 weeks (from 5 mg/kg every 8 weeks which was what was being administered)  -patient improves with ILK - he will message if he needs it and I can overbook him  -cont to follow with ID and wound - started doxycycline recently due to staph.    The patient denies any other new or changing lesions or areas of concern.     Pertinent Past Medical History     {mmcdermpmh:88283::No history of skin cancer}    Problem List    None     ***    Family History:   {derm skin check melanoma history:75452::Negative for melanoma}    Past Medical History, Family History, Social History, Medication List, Allergies, and Problem List were reviewed in the rooming section of Epic.     ROS: Other than symptoms mentioned in the HPI, no fevers, chills, or other skin complaints.    Physical Examination     GENERAL: Well-appearing male in no acute distress, resting comfortably.  NEURO: Alert and oriented, answers questions appropriately  PSYCH: Normal mood and affect  RESP: No increased work of breathing  {  mmcpeextent:88284::SKIN (Full Skin Exam): Examination of the face, eyelids, lips, nose, ears, neck, chest, abdomen, back, arms, legs, hands, feet, palms, soles, nails was performed}  {mmc pe list:88285}***    All areas not commented on are within normal limits or unremarkable

## 2021-01-30 NOTE — Progress Notes (Addendum)
FARHAN, JEAN ([id number]) Visit Report for 01/30/2021 Arrival Information Details Patient Name: [name], [name] Date of Service: 01/30/2021 11:30 AM Medical Record Number: [id number] Patient Account Number: [id number] Date of Birth/Sex: [birth year] (40 y.o. M) Treating RN: Dolan Amen Primary Care Rakayla Ricklefs: [name] Other Clinician: Referring Kendalynn Wideman: [name] Treating Issiah Huffaker/Extender: [name] Cliche in Treatment: 216 Visit Information History Since Last Visit Pain Present Now: No Patient Arrived: Ambulatory Arrival Time: 12:00 Accompanied By: self Transfer Assistance: None Patient Identification Verified: Yes Secondary Verification Process Completed: Yes Patient Requires Transmission-Based No Precautions: Patient Has Alerts: Yes Patient Alerts: Patient has reaction to silver dressings. Electronic Signature(s) Signed: 01/30/2021 12:54:27 PM By: Dolan Amen RN Entered By: Dolan Amen on 01/30/2021 12:54:27 [name] ([id number]) -------------------------------------------------------------------------------- Clinic Level of Care Assessment Details Patient Name: [name], [name] Date of Service: 01/30/2021 11:30 AM Medical Record Number: [id number] Patient Account Number: [id number] Date of Birth/Sex: 1981/03/06 (40 y.o. M) Treating RN: Dolan Amen Primary Care Mikeala Girdler: [name] Other Clinician: Referring Oneal Biglow: [name] Treating Amri Lien/Extender: [name] Cliche in Treatment: 216 Clinic Level of Care Assessment Items TOOL 1 Quantity Score []  - Use when EandM and Procedure is performed on INITIAL visit 0 ASSESSMENTS - Nursing Assessment / Reassessment []  - General Physical Exam (combine w/ comprehensive assessment (listed just below) when performed on new 0 pt. evals) []  - 0 Comprehensive Assessment (HX, ROS, Risk Assessments, Wounds Hx, etc.) ASSESSMENTS - Wound and Skin Assessment / Reassessment []  - Dermatologic / Skin  Assessment (not related to wound area) 0 ASSESSMENTS - Ostomy and/or Continence Assessment and Care []  - Incontinence Assessment and Management 0 []  - 0 Ostomy Care Assessment and Management (repouching, etc.) PROCESS - Coordination of Care []  - Simple Patient / Family Education for ongoing care 0 []  - 0 Complex (extensive) Patient / Family Education for ongoing care []  - 0 Staff obtains Programmer, systems, Records, Test Results / Process Orders []  - 0 Staff telephones HHA, Nursing Homes / Clarify orders / etc []  - 0 Routine Transfer to another Facility (non-emergent condition) []  - 0 Routine Hospital Admission (non-emergent condition) []  - 0 New Admissions / Biomedical engineer / Ordering NPWT, Apligraf, etc. []  - 0 Emergency Hospital Admission (emergent condition) PROCESS - Special Needs []  - Pediatric / Minor Patient Management 0 []  - 0 Isolation Patient Management []  - 0 Hearing / Language / Visual special needs []  - 0 Assessment of Community assistance (transportation, D/C planning, etc.) []  - 0 Additional assistance / Altered mentation []  - 0 Support Surface(s) Assessment (bed, cushion, seat, etc.) INTERVENTIONS - Miscellaneous []  - External ear exam 0 []  - 0 Patient Transfer (multiple staff / Civil Service fast streamer / Similar devices) []  - 0 Simple Staple / Suture removal (25 or less) []  - 0 Complex Staple / Suture removal (26 or more) []  - 0 Hypo/Hyperglycemic Management (do not check if billed separately) []  - 0 Ankle / Brachial Index (ABI) - do not check if billed separately Has the patient been seen at the hospital within the last three years: Yes Total Score: 0 Level Of Care: ____ [name] ([id number]) Electronic Signature(s) Signed: 01/30/2021 5:00:35 PM By: Dolan Amen RN Entered By: Dolan Amen on 01/30/2021 12:56:10 [name] ([id number]) -------------------------------------------------------------------------------- Compression Therapy  Details Patient Name: [name] Date of Service: 01/30/2021 11:30 AM Medical Record Number: [id number] Patient Account Number: [id number] Date of Birth/Sex: 11-20-1980 (40 y.o. M) Treating RN: Dolan Amen Primary Care Melvia Matousek: [name] Other Clinician: Referring Tammara Massing: [name] Treating  Alfonza Toft/Extender: Jeri Cos Weeks in Treatment: 216 Compression Therapy Performed for Wound Assessment: Wound #1 Left,Lateral Lower Leg Performed By: Cora Daniels, RN Compression Type: Four Layer Pre Treatment ABI: 1.2 Electronic Signature(s) Signed: 01/30/2021 12:55:07 PM By: Dolan Amen RN Entered By: Dolan Amen on 01/30/2021 12:55:07 [name] ([id number]) -------------------------------------------------------------------------------- Encounter Discharge Information Details Patient Name: [name] Date of Service: 01/30/2021 11:30 AM Medical Record Number: [id number] Patient Account Number: [id number] Date of Birth/Sex: September 01, 1980 (40 y.o. M) Treating RN: Dolan Amen Primary Care Geetika Laborde: [name] Other Clinician: Referring Raudel [name]: [name] Treating Chenita Ruda/Extender: [name] Cliche in Treatment: 216 Encounter Discharge Information Items Discharge Condition: Stable Ambulatory Status: Ambulatory Discharge Destination: Home Transportation: Private Auto Accompanied By: self Schedule Follow-up Appointment: Yes Clinical Summary of Care: Electronic Signature(s) Signed: 01/30/2021 12:56:03 PM By: Dolan Amen RN Entered By: Dolan Amen on 01/30/2021 12:56:02 [name] ([id number]) -------------------------------------------------------------------------------- Wound Assessment Details Patient Name: [name] Date of Service: 01/30/2021 11:30 AM Medical Record Number: [id number] Patient Account Number: [id number] Date of Birth/Sex: [birth year] (40 y.o. M) Treating RN: Dolan Amen Primary Care Sloane Palmer:  [name] Other Clinician: Referring Omara Alcon: [name] Treating Chablis Losh/Extender: [name] Cliche in Treatment: 216 Wound Status Wound Number: 1 Primary Etiology: Pyoderma Wound Location: Left, Lateral Lower Leg Wound Status: Open Wounding Event: Gradually Appeared Date Acquired: 11/18/2015 Weeks Of Treatment: 216 Clustered Wound: No Wound Measurements Length: (cm) 6 Width: (cm) 6 Depth: (cm) 0.6 Area: (cm) 28.274 Volume: (cm) 16.965 % Reduction in Area: -476% % Reduction in Volume: -332% Wound Description Classification: Full Thickness With Exposed Support Structures Exudate Amount: Large Exudate Type: Serosanguineous Exudate Color: red, brown Treatment Notes Wound #1 (Lower Leg) Wound Laterality: Left, Lateral Cleanser Soap and Water Discharge Instruction: Gently cleanse wound with antibacterial soap, rinse and pat dry prior to dressing wounds Peri-Wound Care Desitin Maximum Strength Ointment 4 (oz) Triamcinolone Acetonide Cream, 0.1%, 15 (g) tube Discharge Instruction: Apply on posterior leg Topical Clobetasol Propionate ointment 0.05%, 60 (g) tube Discharge Instruction: Apply in undermining area and reddened area on posterior leg Primary Dressing Calcium Alginate Dressing, 6x6 (in/in) Discharge Instruction: Apply to wound bed Secondary Dressing Xtrasorb Large 6x9 (in/in) Discharge Instruction: Apply to wound as directed. Do not cut. Secured With Compression Wrap Medichoice 4 layer Compression System, 35-40 mmHG Discharge Instruction: Apply multi-layer wrap as directed. Compression Stockings Add-Ons [name], [name] ([id number]) CarboFLEX Odor Control Dressing, 4x4 (in/in) Discharge Instruction: [place] DO NOT APPLY ON SKIN Place the fibrous (non-shiny) surface of the dressing directly onto the wound for management of malodorous wounds. Electronic Signature(s) Signed: 01/30/2021 5:00:35 PM By: Dolan Amen RN Entered By:  Dolan Amen on 01/30/2021 12:54:38

## 2021-02-02 ENCOUNTER — Other Ambulatory Visit: Payer: Self-pay

## 2021-02-02 DIAGNOSIS — L080 Pyoderma: Secondary | ICD-10-CM | POA: Diagnosis not present

## 2021-02-02 NOTE — Progress Notes (Addendum)
[name] ([id number]) Visit Report for 02/02/2021 Arrival Information Details Patient Name: [name], [name] Date of Service: 02/02/2021 11:30 AM Medical Record Number: [id number] Patient Account Number: [id number] Date of Birth/Sex: [birth year] (40 y.o. M) Treating [name]: [name] Primary Care [name]: [name] Other Clinician: Referring [name]: [name] Treating Dynisha Due/Extender: [name] in [name]: 217 Visit Information History Since Last Visit Pain Present Now: No Patient Arrived: Ambulatory Arrival Time: 11:50 Accompanied By: self Transfer Assistance: None Patient Identification Verified: Yes Secondary Verification Process Completed: Yes Patient Requires Transmission-Based No Precautions: Patient Has Alerts: Yes Patient Alerts: Patient has reaction to silver dressings. Electronic Signature(s) Signed: 02/02/2021 12:19:18 PM By: [name] Entered By: [name] on 02/02/2021 12:19:17 [name] ([id number]) -------------------------------------------------------------------------------- Clinic Level of Care Assessment Details Patient Name: [name] Date of Service: 02/02/2021 11:30 AM Medical Record Number: [id number] Patient Account Number: [id number] Date of Birth/Sex: 1981-03-25 (40 y.o. M) Treating [name]: [name] Primary Care Oliva Montecalvo: [name] Other Clinician: Referring [name]: [name] Treating [name]/Extender: [name] in [name]: 217 Clinic Level of Care Assessment Items TOOL 1 Quantity Score []  - Use when EandM and Procedure is performed on INITIAL visit 0 ASSESSMENTS - Nursing Assessment / Reassessment []  - General Physical Exam (combine w/ comprehensive assessment (listed just below) when performed on new 0 pt. evals) []  - 0 Comprehensive Assessment (HX, ROS, Risk Assessments, Wounds Hx, etc.) ASSESSMENTS - Wound and Skin Assessment / Reassessment []  - Dermatologic / Skin  Assessment (not related to wound area) 0 ASSESSMENTS - Ostomy and/or Continence Assessment and Care []  - Incontinence Assessment and Management 0 []  - 0 Ostomy Care Assessment and Management (repouching, etc.) PROCESS - Coordination of Care []  - Simple Patient / Family Education for ongoing care 0 []  - 0 Complex (extensive) Patient / Family Education for ongoing care []  - 0 Staff obtains Programmer, systems, Records, Test Results / Process Orders []  - 0 Staff telephones HHA, Nursing Homes / Clarify orders / etc []  - 0 Routine Transfer to another Facility (non-emergent condition) []  - 0 Routine Hospital Admission (non-emergent condition) []  - 0 New Admissions / Biomedical engineer / Ordering NPWT, Apligraf, etc. []  - 0 Emergency Hospital Admission (emergent condition) PROCESS - Special Needs []  - Pediatric / Minor Patient Management 0 []  - 0 Isolation Patient Management []  - 0 Hearing / Language / Visual special needs []  - 0 Assessment of Community assistance (transportation, D/C planning, etc.) []  - 0 Additional assistance / Altered mentation []  - 0 Support Surface(s) Assessment (bed, cushion, seat, etc.) INTERVENTIONS - Miscellaneous []  - External ear exam 0 []  - 0 Patient Transfer (multiple staff / Civil Service fast streamer / Similar devices) []  - 0 Simple Staple / Suture removal (25 or less) []  - 0 Complex Staple / Suture removal (26 or more) []  - 0 Hypo/Hyperglycemic Management (do not check if billed separately) []  - 0 Ankle / Brachial Index (ABI) - do not check if billed separately Has the patient been seen at the hospital within the last three years: Yes Total Score: 0 Level Of Care: ____ [name] ([id number]) Electronic Signature(s) Signed: 02/02/2021 4:40:04 PM By: [name] Entered By: [name] on 02/02/2021 12:20:25 [name] ([id number]) -------------------------------------------------------------------------------- Compression Therapy  Details Patient Name: [name] Date of Service: 02/02/2021 11:30 AM Medical Record Number: [id number] Patient Account Number: [id number] Date of Birth/Sex: Mar 15, 1981 (40 y.o. M) Treating [name]: [name] Primary Care [name]: [name] Other Clinician: Referring Simi Briel: [name] Treating  [name]: [name]: 217 Compression Therapy Performed for Wound Assessment: Wound #1 Left,Lateral Lower Leg Performed By: [name] Compression Type: Four Layer Pre [name] ABI: 1.2 Electronic Signature(s) Signed: 02/02/2021 12:19:54 PM By: [name] Entered By: [name] on 02/02/2021 12:19:54 [name] ([id number]) -------------------------------------------------------------------------------- Encounter Discharge Information Details Patient Name: [name] Date of Service: 02/02/2021 11:30 AM Medical Record Number: [id number] Patient Account Number: [id number] Date of Birth/Sex: 1980-09-27 (40 y.o. M) Treating [name]: [name] Primary Care Kyaire Gruenewald: [name] Other Clinician: Referring [name]: [name] Treating [name]: [name] in [name]: 217 Encounter Discharge Information Items Discharge Condition: Stable Ambulatory Status: Ambulatory Discharge Destination: Home Transportation: Private Auto Accompanied By: self Schedule Follow-up Appointment: Yes Clinical Summary of Care: Electronic Signature(s) Signed: 02/02/2021 12:20:19 PM By: [name] Entered By: [name] on 02/02/2021 12:20:19 [name] ([id number]) -------------------------------------------------------------------------------- Wound Assessment Details Patient Name: [name] Date of Service: 02/02/2021 11:30 AM Medical Record Number: [id number] Patient Account Number: [id number] Date of Birth/Sex: 03/19/1981 (40 y.o. M) Treating [name]: [name] Primary Care Ferris Tally:  [name] Other Clinician: Referring [name]: [name] Treating [name]: [name] in [name]: 217 Wound Status Wound Number: 1 Primary Etiology: Pyoderma Wound Location: Left, Lateral Lower Leg Wound Status: Open Wounding Event: Gradually Appeared Date Acquired: 11/18/2015 Weeks Of [name]: 217 Clustered Wound: No Wound Measurements Length: (cm) 6 Width: (cm) 6 Depth: (cm) 0.6 Area: (cm) 28.274 Volume: (cm) 16.965 % Reduction in Area: -476% % Reduction in Volume: -332% Wound Description Classification: Full Thickness With Exposed Support Structures Exudate Amount: Large Exudate Type: Serosanguineous Exudate Color: red, brown [name] Notes Wound #1 (Lower Leg) Wound Laterality: Left, Lateral Cleanser Soap and Water Discharge Instruction: Gently cleanse wound with antibacterial soap, rinse and pat dry prior to dressing wounds Peri-Wound Care Desitin Maximum Strength Ointment 4 (oz) Triamcinolone Acetonide Cream, 0.1%, 15 (g) tube Discharge Instruction: Apply on posterior leg Topical Clobetasol Propionate ointment 0.05%, 60 (g) tube Discharge Instruction: Apply in undermining area and reddened area on posterior leg Primary Dressing Calcium Alginate Dressing, 6x6 (in/in) Discharge Instruction: Apply to wound bed Secondary Dressing Xtrasorb Large 6x9 (in/in) Discharge Instruction: Apply to wound as directed. Do not cut. Secured With Compression Wrap Medichoice 4 layer Compression System, 35-40 mmHG Discharge Instruction: Apply multi-layer wrap as directed. Compression Stockings Add-Ons Miley, Jacobus ([id number]) CarboFLEX Odor Control Dressing, 4x4 (in/in) Discharge Instruction: [place] DO NOT APPLY ON SKIN Place the fibrous (non-shiny) surface of the dressing directly onto the wound for management of malodorous wounds. Electronic Signature(s) Signed: 02/02/2021 4:40:04 PM By: [name] Entered By:  [name] on 02/02/2021 12:19:27

## 2021-02-06 ENCOUNTER — Encounter: Payer: 59 | Admitting: Physician Assistant

## 2021-02-06 ENCOUNTER — Other Ambulatory Visit: Payer: Self-pay

## 2021-02-06 DIAGNOSIS — L080 Pyoderma: Secondary | ICD-10-CM | POA: Diagnosis not present

## 2021-02-06 NOTE — Progress Notes (Addendum)
[name], [name] ([id number]) Visit Report for 02/06/2021 Arrival Information Details Patient Name: [name], [name] Date of Service: 02/06/2021 8:15 AM Medical Record Number: [id number] Patient Account Number: [id number] Date of Birth/Sex: [birth year] (40 y.o. M) Treating RN: Dolan Amen Primary Care Laquashia Mergenthaler: [name] Other Clinician: Referring Kimaya Whitlatch: [name] Treating Tsuneo Faison/Extender: [name] Cliche in Treatment: 217 Visit Information History Since Last Visit Pain Present Now: No Patient Arrived: Ambulatory Arrival Time: 08:16 Accompanied By: self Transfer Assistance: None Patient Identification Verified: Yes Secondary Verification Process Completed: Yes Patient Requires Transmission-Based No Precautions: Patient Has Alerts: Yes Patient Alerts: Patient has reaction to silver dressings. Electronic Signature(s) Signed: 02/06/2021 4:47:24 PM By: Dolan Amen RN Entered By: Dolan Amen on 02/06/2021 08:17:20 [name] ([id number]) -------------------------------------------------------------------------------- Clinic Level of Care Assessment Details Patient Name: [name] Date of Service: 02/06/2021 8:15 AM Medical Record Number: [id number] Patient Account Number: [id number] Date of Birth/Sex: 01-10-1981 (40 y.o. M) Treating RN: Dolan Amen Primary Care Nitza Schmid: [name] Other Clinician: Referring Campbell Agramonte: [name] Treating Arnetia Bronk/Extender: [name] Cliche in Treatment: 217 Clinic Level of Care Assessment Items TOOL 1 Quantity Score []  - Use when EandM and Procedure is performed on INITIAL visit 0 ASSESSMENTS - Nursing Assessment / Reassessment []  - General Physical Exam (combine w/ comprehensive assessment (listed just below) when performed on new 0 pt. evals) []  - 0 Comprehensive Assessment (HX, ROS, Risk Assessments, Wounds Hx, etc.) ASSESSMENTS - Wound and Skin Assessment / Reassessment []  - Dermatologic / Skin  Assessment (not related to wound area) 0 ASSESSMENTS - Ostomy and/or Continence Assessment and Care []  - Incontinence Assessment and Management 0 []  - 0 Ostomy Care Assessment and Management (repouching, etc.) PROCESS - Coordination of Care []  - Simple Patient / Family Education for ongoing care 0 []  - 0 Complex (extensive) Patient / Family Education for ongoing care []  - 0 Staff obtains Consents, Records, Test Results / Process Orders []  - 0 Staff telephones HHA, Nursing Homes / Clarify orders / etc []  - 0 Routine Transfer to another Facility (non-emergent condition) []  - 0 Routine Hospital Admission (non-emergent condition) []  - 0 New Admissions / Biomedical engineer / Ordering NPWT, Apligraf, etc. []  - 0 Emergency Hospital Admission (emergent condition) PROCESS - Special Needs []  - Pediatric / Minor Patient Management 0 []  - 0 Isolation Patient Management []  - 0 Hearing / Language / Visual special needs []  - 0 Assessment of Community assistance (transportation, D/C planning, etc.) []  - 0 Additional assistance / Altered mentation []  - 0 Support Surface(s) Assessment (bed, cushion, seat, etc.) INTERVENTIONS - Miscellaneous []  - External ear exam 0 []  - 0 Patient Transfer (multiple staff / Civil Service fast streamer / Similar devices) []  - 0 Simple Staple / Suture removal (25 or less) []  - 0 Complex Staple / Suture removal (26 or more) []  - 0 Hypo/Hyperglycemic Management (do not check if billed separately) []  - 0 Ankle / Brachial Index (ABI) - do not check if billed separately Has the patient been seen at the hospital within the last three years: Yes Total Score: 0 Level Of Care: ____ [name] ([id number]) Electronic Signature(s) Signed: 02/06/2021 4:47:24 PM By: Dolan Amen RN Entered By: Dolan Amen on 02/06/2021 09:47:09 [name], [name] ([id number]) -------------------------------------------------------------------------------- Compression Therapy  Details Patient Name: [name] Date of Service: 02/06/2021 8:15 AM Medical Record Number: [id number] Patient Account Number: [id number] Date of Birth/Sex: 1981/01/16 (40 y.o. M) Treating RN: Dolan Amen Primary Care Dorthia Tout: [name] Other Clinician: Referring Evan Mackie: [name] Treating  Sheria Rosello/Extender: Jeri Cos Weeks in Treatment: 217 Compression Therapy Performed for Wound Assessment: Wound #1 Left,Lateral Lower Leg Performed By: Clinician Dolan Amen, RN Compression Type: Four Layer Pre Treatment ABI: 1.2 Post Procedure Diagnosis Same as Pre-procedure Electronic Signature(s) Signed: 02/06/2021 4:47:24 PM By: Dolan Amen RN Entered By: Dolan Amen on 02/06/2021 08:54:22 [name], [name] ([id number]) -------------------------------------------------------------------------------- Encounter Discharge Information Details Patient Name: [name] Date of Service: 02/06/2021 8:15 AM Medical Record Number: [id number] Patient Account Number: [id number] Date of Birth/Sex: 07-14-1980 (40 y.o. M) Treating RN: Dolan Amen Primary Care Arian Murley: [name] Other Clinician: Referring Kia Stavros: [name] Treating Sherel Fennell/Extender: [name] Cliche in Treatment: 217 Encounter Discharge Information Items Discharge Condition: Stable Ambulatory Status: Ambulatory Discharge Destination: Home Transportation: Private Auto Accompanied By: self Schedule Follow-up Appointment: Yes Clinical Summary of Care: Electronic Signature(s) Signed: 02/06/2021 4:47:24 PM By: Dolan Amen RN Entered By: Dolan Amen on 02/06/2021 09:49:18 [name] ([id number]) -------------------------------------------------------------------------------- Lower Extremity Assessment Details Patient Name: [name] Date of Service: 02/06/2021 8:15 AM Medical Record Number: [id number] Patient Account Number: [id number] Date of Birth/Sex: 1981/03/02 (40 y.o.  M) Treating RN: Dolan Amen Primary Care Kalina Morabito: [name] Other Clinician: Referring Merlyn Bollen: [name] Treating Marilea Gwynne/Extender: [name] Cliche in Treatment: 217 Edema Assessment Assessed: [Left: Yes] [Right: No] Edema: [Left: Ye] [Right: s] Calf Left: Right: Point of Measurement: 33 cm From Medial Instep 48.5 cm Ankle Left: Right: Point of Measurement: 12 cm From Medial Instep 29.5 cm Vascular Assessment Pulses: Dorsalis Pedis Palpable: [Left:Yes] Electronic Signature(s) Signed: 02/06/2021 4:47:24 PM By: Dolan Amen RN Entered By: Dolan Amen on 02/06/2021 08:36:32 [place], [name] ([id number]) -------------------------------------------------------------------------------- Multi Wound Chart Details Patient Name: [name] Date of Service: 02/06/2021 8:15 AM Medical Record Number: [id number] Patient Account Number: [id number] Date of Birth/Sex: 08-22-1980 (40 y.o. M) Treating RN: Dolan Amen Primary Care Deepti Gunawan: [name] Other Clinician: Referring Virgie Chery: [name] Treating Faydra Korman/Extender: [name] Cliche in Treatment: 217 Vital Signs Height(in): 71 Pulse(bpm): 83 Weight(lbs): 338 Blood Pressure(mmHg): 125/74 Body Mass Index(BMI): 47 Temperature(F): 98.4 Respiratory Rate(breaths/min): 18 Photos: [N/A:N/A] Wound Location: Left, Lateral Lower Leg N/A N/A Wounding Event: Gradually Appeared N/A N/A Primary Etiology: Pyoderma N/A N/A Comorbid History: Sleep Apnea, Hypertension, Colitis N/A N/A Date Acquired: 11/18/2015 N/A N/A Weeks of Treatment: 217 N/A N/A Wound Status: Open N/A N/A Measurements L x W x D (cm) 5x5.2x0.6 N/A N/A Area (cm) : 20.42 N/A N/A Volume (cm) : 12.252 N/A N/A % Reduction in Area: -316.00% N/A N/A % Reduction in Volume: -212.00% N/A N/A Classification: Full Thickness With Exposed N/A N/A Support Structures Exudate Amount: Large N/A N/A Exudate Type: Serosanguineous N/A N/A Exudate  Color: red, brown N/A N/A Granulation Amount: Large (67-100%) N/A N/A Granulation Quality: Red N/A N/A Necrotic Amount: Small (1-33%) N/A N/A Exposed Structures: Fat Layer (Subcutaneous Tissue): N/A N/A Yes Tendon: Yes Muscle: Yes Fascia: No Joint: No Bone: No Epithelialization: Small (1-33%) N/A N/A Treatment Notes [name], [name] ([id number]) Electronic Signature(s) Signed: 02/06/2021 4:47:24 PM By: Dolan Amen RN Entered By: Dolan Amen on 02/06/2021 08:54:00 [name] ([id number]) -------------------------------------------------------------------------------- Multi-Disciplinary Care Plan Details Patient Name: [name] Date of Service: 02/06/2021 8:15 AM Medical Record Number: [id number] Patient Account Number: [id number] Date of Birth/Sex: [birth year] (40 y.o. M) Treating RN: Dolan Amen Primary Care Dearl Rudden: [name] Other Clinician: Referring Tavis Kring: [name] Treating Athan Casalino/Extender: [name] Cliche in Treatment: 217 oo Multidisciplinary Care Plan reviewed with physician Active Inactive Electronic Signature(s) Signed: 02/06/2021 4:47:24 PM By:  Dolan Amen RN Entered By: Dolan Amen on 02/06/2021 08:52:25 [name], [name] ([id number]) -------------------------------------------------------------------------------- Pain Assessment Details Patient Name: [name], [name] Date of Service: 02/06/2021 8:15 AM Medical Record Number: [id number] Patient Account Number: [id number] Date of Birth/Sex: Jul 28, 1980 (40 y.o. M) Treating RN: Dolan Amen Primary Care Teonna Coonan: [name] Other Clinician: Referring Brent Noto: [name] Treating Shan Valdes/Extender: [name] Cliche in Treatment: 217 Active Problems Location of Pain Severity and Description of Pain Patient Has Paino No Site Locations Rate the pain. Current Pain Level: 0 Pain Management and Medication Current Pain Management: Electronic Signature(s) Signed:  02/06/2021 4:47:24 PM By: Dolan Amen RN Entered By: Dolan Amen on 02/06/2021 08:18:40 [name] ([id number]) -------------------------------------------------------------------------------- Patient/Caregiver Education Details Patient Name: [name] Date of Service: 02/06/2021 8:15 AM Medical Record Number: [id number] Patient Account Number: [id number] Date of Birth/Gender: 24-Feb-1981 (40 y.o. M) Treating RN: Dolan Amen Primary Care Physician: [name] Other Clinician: Referring Physician: [name] Treating Physician/Extender: [name] Cliche in Treatment: 217 Education Assessment Education Provided To: Patient Education Topics Provided Wound/Skin Impairment: Methods: Explain/Verbal Responses: State content correctly Electronic Signature(s) Signed: 02/06/2021 4:47:24 PM By: Dolan Amen RN Entered By: Dolan Amen on 02/06/2021 09:47:39 [name], [name] ([id number]) -------------------------------------------------------------------------------- Wound Assessment Details Patient Name: [name] Date of Service: 02/06/2021 8:15 AM Medical Record Number: [id number] Patient Account Number: [id number] Date of Birth/Sex: July 04, 1980 (40 y.o. M) Treating RN: Dolan Amen Primary Care Donyale Berthold: [name] Other Clinician: Referring Thompson Mckim: [name] Treating Micaiah Litle/Extender: [name] Cliche in Treatment: 217 Wound Status Wound Number: 1 Primary Etiology: Pyoderma Wound Location: Left, Lateral Lower Leg Wound Status: Open Wounding Event: Gradually Appeared Comorbid History: Sleep Apnea, Hypertension, Colitis Date Acquired: 11/18/2015 Weeks Of Treatment: 217 Clustered Wound: No Photos Wound Measurements Length: (cm) 5 Width: (cm) 5.2 Depth: (cm) 0.6 Area: (cm) 20.42 Volume: (cm) 12.252 % Reduction in Area: -316% % Reduction in Volume: -212% Epithelialization: Small (1-33%) Tunneling: No Wound  Description Classification: Full Thickness With Exposed Support Structures Exudate Amount: Large Exudate Type: Serosanguineous Exudate Color: red, brown Foul Odor After Cleansing: No Slough/Fibrino Yes Wound Bed Granulation Amount: Large (67-100%) Exposed Structure Granulation Quality: Red Fascia Exposed: No Necrotic Amount: Small (1-33%) Fat Layer (Subcutaneous Tissue) Exposed: Yes Necrotic Quality: Adherent Slough Tendon Exposed: Yes Muscle Exposed: Yes Necrosis of Muscle: No Joint Exposed: No [name], [name] ([id number]) Bone Exposed: No Treatment Notes Wound #1 (Lower Leg) Wound Laterality: Left, Lateral Cleanser Wound Cleanser Discharge Instruction: Wash your hands with soap and water. Remove old dressing, discard into plastic bag and place into trash. Cleanse the wound with Wound Cleanser prior to applying a clean dressing using gauze sponges, not tissues or cotton balls. Do not scrub or use excessive force. Pat dry using gauze sponges, not tissue or cotton balls. Peri-Wound Care Desitin Maximum Strength Ointment 4 (oz) Discharge Instruction: Apply from 12 o'clock to 6 o'clock periwound Triamcinolone Acetonide Cream, 0.1%, 15 (g) tube Discharge Instruction: Apply on posterior leg for itchiness and irritation Topical Clobetasol Propionate ointment 0.05%, 60 (g) tube Discharge Instruction: Apply in undermining area Primary Dressing Calcium Alginate Dressing, 6x6 (in/in) Discharge Instruction: Apply to wound bed Secondary Dressing Drawtex Hydroconductive Wound Dressing, 4x4 (in/in) Discharge Instruction: Apply over calcium alginate Xtrasorb Large 6x9 (in/in) Discharge Instruction: Cover entire wound Secured With Compression Wrap Medichoice 4 layer Compression System, 35-40 mmHG Discharge Instruction: Apply multi-layer wrap as directed. Compression Stockings Add-Ons CarboFLEX Odor Control Dressing, 4x4 (in/in) Discharge Instruction: PLEASE APPLY OVER THE XSORB DO NOT  APPLY  ON SKIN Place the fibrous (non-shiny) surface of the dressing directly onto the wound for management of malodorous wounds. Electronic Signature(s) Signed: 02/06/2021 4:47:24 PM By: Dolan Amen RN Entered By: Dolan Amen on 02/06/2021 08:57:52 [name], [name] ([id number]) -------------------------------------------------------------------------------- Vitals Details Patient Name: [name] Date of Service: 02/06/2021 8:15 AM Medical Record Number: [id number] Patient Account Number: [id number] Date of Birth/Sex: 12/02/1980 (40 y.o. M) Treating RN: Dolan Amen Primary Care Amato Sevillano: [name] Other Clinician: Referring Kyheem Bathgate: [name] Treating Jerett Odonohue/Extender: [name] Cliche in Treatment: 217 Vital Signs Time Taken: 08:17 Temperature (F): 98.4 Height (in): 71 Pulse (bpm): 83 Weight (lbs): 338 Respiratory Rate (breaths/min): 18 Body Mass Index (BMI): 47.1 Blood Pressure (mmHg): 125/74 Reference Range: 80 - 120 mg / dl Electronic Signature(s) Signed: 02/06/2021 4:47:24 PM By: Dolan Amen RN Entered By: Dolan Amen on 02/06/2021 70:96:43

## 2021-02-06 NOTE — Progress Notes (Addendum)
[name], [name] ([id number]) Visit Report for 02/06/2021 Chief Complaint Document Details Patient Name: [name], [name] Date of Service: 02/06/2021 8:15 AM Medical Record Number: [id number] Patient Account Number: [id number] Date of Birth/Sex: Sep 21, 1980 (40 y.o. 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 M) Treating RN: Dolan Amen Primary Care Provider: [name] Other Clinician: Referring Provider: [name] Treating Provider/Extender: [name] Cliche in Treatment: 217 History of Present Illness HPI Description: 12/04/16; 40 year old man who comes into the clinic today for review of a wound on the posterior left calf. He tells me that is been there for about a year. He is not a diabetic he does smoke half a pack per day. He was seen in the ER on 11/20/16 felt to have cellulitis around the wound and was given clindamycin. An x-ray did not show osteomyelitis. The patient initially tells me that he has a milk allergy that sets off a pruritic itching rash on his lower legs which she scratches incessantly and he thinks that's what may have set up the wound. He has been using various topical antibiotics and ointments without any effect. He works in a [employer] and is on his feet all day. He does not have a  prior history of wounds however he does have the rash on both lower legs the right arm and the ventral aspect of his left arm. These are excoriations and clearly have had scratching however there are of macular looking areas on both legs including a substantial larger area on the right leg. This does not have an underlying open area. There is no blistering. The patient tells me that 2 years ago in [place] in response to the rash on his legs he saw a dermatologist who told him he had a condition which may be pyoderma gangrenosum although I may be putting words into his mouth. He seemed to recognize this. On further questioning he admits to a 5 year history of quiesced. ulcerative colitis. He is not in any treatment for this. He's had no recent travel 12/11/16; the patient arrives today with his wound and roughly the same condition we've been using silver alginate this is a deep punched out wound with some surrounding erythema but no tenderness. Biopsy I did did not show confirmed pyoderma gangrenosum suggested nonspecific inflammation and vasculitis but does not provide an actual description of what was seen by the pathologist. I'm really not able to understand this We have also received information from the patient's dermatologist in [place] notes from April 2016. This was a doctor Agarwal-antal. 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 He was prescribed topical steroid high potency under occlusion which helped but at this point the patient did not have a deep punched out wound. 12/18/16; the patient's wound is larger in terms of surface area however this surface looks better and there is less depth. The surrounding erythema also is better. The patient states that the wrap we put on came off 2 days ago when he has been using his compression stockings.  He we are in the process of getting a dermatology consult. 12/26/16 on evaluation today patient's left lower extremity wound shows evidence of  infection with surrounding erythema noted. He has been tolerating the dressing changes but states that he has noted more discomfort. There is a larger area of erythema surrounding the wound. No fevers, chills, nausea, or vomiting noted at this time. With that being said the wound still does have slough covering the surface. He is not allergic to any medication that he is aware of at this point. In regard to his right lower extremity he had several regions that are erythematous and pruritic he wonders if there's anything we can do to help that. 01/02/17 I reviewed patient's wound culture which was obtained his visit last week. He was placed on doxycycline at that point. Unfortunately that does not appear to be an antibiotic that would likely help with the situation however the pseudomonas noted on culture is sensitive to Cipro. Also unfortunately patient's wound seems to have a large compared to last week's evaluation. Not severely so but there are definitely increased measurements in general. He is continuing to have discomfort as well he writes this to be a seven out of 10. In fact he would prefer me not to perform any debridement today due to the fact that he is having discomfort and considering he has an active infection on the little reluctant to do so anyway. No fevers, chills, nausea, or vomiting noted at this time. 01/08/17; patient seems dermatology on September 5. I suspect dermatology will want the slides from the biopsy I did sent to their pathologist. I'm not sure if there is a way we can expedite that. In any case the culture I did before I left on vacation 3 weeks ago showed Pseudomonas he was given 10 days of Cipro and per her description of her intake nurses is actually somewhat better this week although the wound is quite a bit bigger than I remember the last time I saw this. He still has 3 more days of Cipro 01/21/17; dermatology appointment tomorrow. He has completed the ciprofloxacin  for Pseudomonas. Surface of the wound looks better however he is had some deterioration in the lesions on his right leg. Meantime the left lateral leg wound we will continue with sample 01/29/17; patient had his dermatology appointment but I can't yet see that note. He is completed his antibiotics. The wound is more superficial but considerably larger in circumferential area than when he came in. This is in his left lateral calf. He also has swollen erythematous areas with superficial wounds on the right leg and small papular areas on both arms. There apparently areas in her his upper thighs and buttocks I did not look at those. Dermatology biopsied the right leg. Hopefully will have their input next week. 02/05/17; patient went back to see his dermatologist who told him that he had a "scratching problem" as well as staph. He is now on a 30 day course of doxycycline and I believe she gave him triamcinolone cream to the right leg areas to help with the itching [not exactly sure but probably triamcinolone]. She apparently looked at the left lateral leg wound although this was not rebiopsied and I think felt to be ultimately part of the same pathogenesis. He is using sample border foam and changing nevus himself. He now has a new open area on the right posterior leg which was his biopsy site I don't have any of the dermatology notes  02/12/17; we put the patient in compression last week with SANTYL to the wound on the left leg and the biopsy. Edema is much better and the depth of the wound is now at level of skin. Area is still the same oBiopsy site on the right lateral leg we've also been using santyl with a border foam dressing and he is changing this himself. 02/19/17; Using silver alginate started last week to both the substantial left leg wound and the biopsy site on the right wound. He is tolerating compression well. Has a an appointment with his primary M.D. tomorrow wondering about diuretics although  I'm wondering if the edema problem is actually lymphedema 02/26/17; the patient has been to see his primary doctor Dr. Jerrel Ivory at [facility] our primary care. She started him on Lasix 20 mg and this seems to have helped with the edema. However we are not making substantial change with the left lateral calf wound and inflammation. The biopsy site on the right leg also looks stable but not really all that different. 03/12/17; the patient has been to see vein and vascular Dr. Lucky Cowboy. He has had venous reflux studies I have not reviewed these. I did get a call from his dermatology office. They felt that he might have pathergy based on their biopsy on his right leg which led them to look at the slides of VONZELL, LINDBLAD ([id number]) the biopsy I did on the left leg and they wonder whether this represents pyoderma gangrenosum which was the original supposition in a man with ulcerative colitis albeit inactive for many years. They therefore recommended clobetasol and tetracycline i.e. 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 03/26/17; apparently the patient just had reflux studies not an appointment with Dr. dew. She arrives in clinic today having applied clobetasol for 2-3 weeks. He notes over the last 2-3 days excessive drainage having to change the dressing 3-4 times a day and also expanding erythema. 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 Lillia Carmel at [facility] dermatology locally who kindly saw him at my request. 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 My concern was the degree of erythema medially extending from his left leg wound which was either inflammation from pyoderma  or cellulitis. I put him on Augmentin however culture of the wound showed Pseudomonas which is quinolone sensitive. I really don't believe he has cellulitis however in view of everything I will continue and give him a course of Cipro. He is also on doxycycline as an immune modulator for the pyoderma. In addition to his original wound on the left lateral leg with surrounding erythema he has a wound on the right posterior calf which was an original biopsy site done by dermatology. 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 Saw Dr. Lillia Carmel yesterday. He has been using topical antibiotics to both wound areas his original wound on the left and the biopsies/pathergy area on the right. There is definitely some improvement in the inflammation around the wound on the right although the patient states he has increasing sensitivity of the wounds. He is on prednisone 60 and doxycycline 1 as prescribed by Dr. Lillia Carmel. He is covering the topical antibiotic with gauze  and putting this in his own compression stocks and changing this daily. He states that Dr. Lottie Rater did a culture of the left leg wound yesterday 05/07/17; pyoderma gangrenosum. The patient saw Dr. Lillia Carmel yesterday and has a follow-up with her in one month. He is still using topical antibiotics to both wounds although he can't recall exactly what type. He is still on prednisone 60 mg. Dr. Lillia Carmel stated that the doxycycline could stop if we were in agreement. 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 The right medial leg was induced by biopsy/pathergy. The area on the right is essentially healed. 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 The right medial leg has closed and remains closed. He  is still on prednisone 60. oHe tells me he missed his last dermatology appointment with Dr. Lillia Carmel but will make another appointment. He reports that her blood sugar at a recent screen in [place] was high 200's. He was 180 today. 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 I think he is going to require still much longer prednisone perhaps another 3 months before attempting to taper. In the meantime his wound is a lot better. Smaller. He is cleaning this off daily and applying topical antibiotics. When he was last in the clinic I thought about changing to [facility] and actually put in a couple of calls to dermatology although probably not during their business hours. 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 He continues on oral prednisone. He has been using triple antibiotic ointment. There is surface debris and we will transition to [facility] and have him return in 2 weeks. He has lost 30 pounds since his last appointment with lifestyle modification. He may benefit from topical steroid cream for treatment this can be considered at a later date. 08/22/17 on evaluation today patient appears to actually be doing rather well in regard to his left lateral lower extremity ulcer. He has actually been managed by Dr. Dellia Nims most recently. Patient is currently on oral steroids at this time. This seems to have been of benefit for him. Nonetheless his last visit was actually with Leah on 08/06/17. Currently he is not utilizing any topical steroid creams although this could be of benefit as well. No fevers, chills, nausea, or vomiting noted at this time. 09/05/17 on evaluation today patient appears to be doing better in regard to his left lateral lower extremity ulcer. He has been tolerating the dressing changes without complication. He is using Santyl with good effect. Overall I'm  very pleased with how things are standing at this point. Patient likewise is happy that this is doing better. 09/19/17 on evaluation today patient actually appears to be doing rather well in regard to his left lateral lower extremity ulcer. 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 He's not having any significant pain. 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 He has been tolerating the Santyl without complication and in general I feel like he's making good progress. 10/17/17 on evaluation today patient  appears to be doing very well in regard to his left lateral lower surety ulcer. He has been tolerating the dressing changes without complication. There does not appear to be any evidence of infection he's alternating the Santyl and the triple antibiotic ointment every other day this seems to be doing well for him. 11/03/17 on evaluation today patient appears to be doing very well in regard to his left lateral lower extremity ulcer. He is been tolerating the dressing changes without complication which is good news. Fortunately there does not appear to be any evidence of infection which is also great news. Overall is doing excellent they are starting to taper down on the prednisone is down to 40 mg at this point it also started topical clobetasol for him. 11/17/17 on evaluation today patient appears to be doing well in regard to his left lateral lower surety ulcer. He's been tolerating the dressing changes without complication. He does note that he is having no pain, no excessive drainage or discharge, and overall he feels like things are going about how he would expect and hope they would. Overall he seems to have no evidence of infection at this time in my opinion which is good news. 12/04/17-He is seen in follow-up  evaluation for right lateral lower extremity ulcer. He has been applying topical steroid cream. Today's measurement show slight increase in size. Over the next 2 weeks we will transition to every other day Santyl and steroid cream. He has been encouraged to monitor for changes and notify clinic with any concerns 12/15/17 on evaluation today patient's left lateral motion the ulcer and fortunately is doing worse again at this point. This just since last week to this week has close to doubled in size according to the patient. I did not seeing last week's I do not have a visual to compare this to in our system was also down so we do not have all the charts and at this point. Nonetheless it does have me somewhat concerned in regard to the fact that again he was worried enough about it he has contact the dermatology that placed them back on the full strength, 50 mg a day of the prednisone that he was taken previous. He continues to alternate using clobetasol along with Santyl at this point. He is obviously somewhat frustrated. 12/22/17 on evaluation today patient appears to be doing a little worse compared to last evaluation. Unfortunately the wound is a little deeper and slightly larger than the last week's evaluation. With that being said he has made some progress in regard to the irritation surrounding at this time unfortunately despite that progress that's been made he still has a significant issue going on here. 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 BAZIL, DHANANI ([id number]) For that reason I discussed with him today the possibility of perform a wound culture to ensure there's nothing overtly infected. 01/06/18 on evaluation today patient's wound is larger and deeper than previously evaluated. With that being said it did appear that his wound was  infected after my last evaluation with him. Subsequently I did end up prescribing a prescription for Bactrim DS which she has been taking and having no complication with. Fortunately there does not appear to be any evidence of infection at this point in time as far as anything spreading, no want to touch, and overall I feel like  things are showing signs of improvement. 01/13/18 on evaluation today patient appears to be even a little larger and deeper than last time. There still muscle exposed in the base of the wound. Nonetheless he does appear to be less erythematous I do believe inflammation is calming down also believe the infection looks like it's probably resolved at this time based on what I'm seeing. No fevers, chills, nausea, or vomiting noted at this time. 01/30/18 on evaluation today patient actually appears to visually look better for the most part. Unfortunately those visually this looks better he does seem to potentially have what may be an abscess in the muscle that has been noted in the central portion of the wound. This is the first time that I have noted what appears to be fluctuance in the central portion of the muscle. With that being said I'm somewhat more concerned about the fact that this might indicate an abscess formation at this location. I do believe that an ultrasound would be appropriate. This is likely something we need to try to do as soon as possible. He has been switch to mupirocin ointment and he is no longer using the steroid ointment as prescribed by dermatology he sees them again next week he's been decreased from 60 to 40 mg of prednisone. 03/09/18 on evaluation today patient actually appears to be doing a little better compared to last time I saw him. There's not as much erythema surrounding the wound itself. He I did review his most recent infectious disease note which was dated 02/24/18. He saw Dr. Michel Bickers in [place]. 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 Patient is now off of antibiotics and they are continually observing this. There seems to be no change in the past two weeks in my pinion based on what the patient says and what I see today compared to what Dr. Megan Salon likely saw two weeks ago. No fevers, chills, nausea, or vomiting noted at this time. 03/23/18 on evaluation today patient's wound actually appears to be showing signs of improvement which is good news. He is currently still on the Dapsone. He is also working on tapering the prednisone to get off of this and Dr. Lottie Rater is working with him in this regard. Nonetheless overall I feel like the wound is doing well it does appear based on the infectious disease note that I reviewed from Dr. 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 I did also review the results of his ultrasound of left lower extremity which revealed there was a dentist tissue in the base of the wound without an abscess noted. 04/06/18 on evaluation today the patient's left lateral lower extremity ulcer actually appears to be doing fairly well which is excellent news. There does not appear to be any evidence of infection at this time which is also great news. Overall he still does have a significantly large ulceration although little by little he seems to be making progress. He is down to 10 mg a day of the prednisone. 04/20/18 on evaluation today patient actually appears to be doing excellent at this time in regard to his left lower extremity ulcer. He's making signs of good progress unfortunately this is taking much longer than we would really like to see but nonetheless he is  making progress. Fortunately there does not appear to be any evidence of infection at this time. No fevers, chills, nausea, or vomiting noted at  this time. The patient has not been using the Santyl due to the cost he hadn't got in this field yet. He's mainly been using the antibiotic ointment topically. Subsequently he also tells me that he really has not been scrubbing in the shower I think this would be helpful again as I told him it doesn't have to be anything too aggressive to even make it believe just enough to keep it free of some of the loose slough and biofilm on the wound surface. 05/11/18 on evaluation today patient's wound appears to be making slow but sure progress in regard to the left lateral lower extremity ulcer. He is been tolerating the dressing changes without complication. Fortunately there does not appear to be any evidence of infection at this time. He is still just using triple antibiotic ointment along with clobetasol occasionally over the area. He never got the Santyl and really does not seem to intend to in my pinion. 06/01/18 on evaluation today patient appears to be doing a little better in regard to his left lateral lower extremity ulcer. He states that overall he does not feel like he is doing as well with the Dapsone as he did with the prednisone. Nonetheless he sees his dermatologist later today and is gonna talk to them about the possibility of going back on the prednisone. Overall again I believe that the wound would be better if you would utilize Santyl but he really does not seem to be interested in going back to the [place] at this point. He has been using triple antibiotic ointment. 06/15/18 on evaluation today patient's wound actually appears to be doing about the same at this point. Fortunately there is no signs of infection at this time. He has made slight improvements although he continues to not really want to clean the wound bed at this point. He states that he just doesn't mess with it he doesn't want to cause any problems with everything else he has going on. He has been on medication, antibiotics  as prescribed by his dermatologist, for a staff infection of his lower extremities which is really drying out now and looking much better he tells me. Fortunately there is no sign of overall infection. 06/29/18 on evaluation today patient appears to be doing well in regard to his left lateral lower surety ulcer all things considering. Fortunately his staff infection seems to be greatly improved compared to previous. He has no signs of infection and this is drying up quite nicely. He is still the doxycycline for this is no longer on cental, Dapsone, or any of the other medications. His dermatologist has recommended possibility of an infusion but right now he does not want to proceed with that. 07/13/18 on evaluation today patient appears to be doing about the same in regard to his left lateral lower surety ulcer. Fortunately there's no signs of infection at this time which is great news. Unfortunately he still builds up a significant amount of Slough/biofilm of the surface of the wound he still is not really cleaning this as he should be appropriately. Again I'm able to easily with saline and gauze remove the majority of this on the surface which if you would do this at home would likely be a dramatic improvement for him as far as getting the area to improve. Nonetheless overall I still feel like he  is making progress is just very slow. I think Santyl will be of benefit for him as well. Still he has not gotten this as of this point. 07/27/18 on evaluation today patient actually appears to be doing little worse in regards of the erythema around the periwound region of the wound he also tells me that he's been having more drainage currently compared to what he was experiencing last time I saw him. He states not quite as bad as what he had because this was infected previously but nonetheless is still appears to be doing poorly. Fortunately there is no evidence of systemic infection at this point. The patient  tells me that he is not going to be able to afford the Santyl. He is still waiting to hear about the infusion therapy with his dermatologist. Apparently she wants an updated colonoscopy first. 08/10/18 on evaluation today patient appears to be doing better in regard to his left lateral lower extremity ulcer. Fortunately he is showing signs of improvement in this regard he's actually been approved for Remicade infusion's as well although this has not been scheduled as of yet. Fortunately there's no signs of active infection at this time in regard to the wound although he is having some issues with infection of the right lower extremity is been seen as dermatologist for this. Fortunately they are definitely still working with him trying to keep things under control. ELIYA, GEIMAN ([id number]) 09/07/18 on evaluation today patient is actually doing rather well in regard to his left lateral lower extremity ulcer. He notes these actually having some hair grow back on his extremity which is something he has not seen in years. He also tells me that the pain is really not giving them any trouble at this time which is also good news overall she is very pleased with the progress he's using a combination of the mupirocin along with the probate is all mixed. 09/21/18 on evaluation today patient actually appears to be doing fairly well all things considered in regard to his looks from the ulcer. He's been tolerating the dressing changes without complication. Fortunately there's no signs of active infection at this time which is good news he is still on all antibiotics or prevention of the staff infection. He has been on prednisone for time although he states it is gonna contact his dermatologist and see if she put them on a short course due to some irritation that he has going on currently. Fortunately there's no evidence of any overall worsening this is going very slow I think cental would be something that would be  helpful for him although he states that $50 for tube is quite expensive. He therefore is not willing to get that at this point. 10/06/18 on evaluation today patient actually appears to be doing decently well in regard to his left lateral leg ulcer. He's been tolerating the dressing changes without complication. Fortunately there's no signs of active infection at this time. Overall I'm actually rather pleased with the progress he's making although it's slow he doesn't show any signs of infection and he does seem to be making some improvement. I do believe that he may need a switch up and dressings to try to help this to heal more appropriately and quickly. 10/19/18 on evaluation today patient actually appears to be doing better in regard to his left lateral lower extremity ulcer. This is shown signs of having much less Slough buildup at this point due to the fact he has been using  the Santyl. Obviously this is very good news. The overall size of the wound is not dramatically smaller but again the appearance is. 11/02/18 on evaluation today patient actually appears to be doing quite well in regard to his lower [place] ulcer. A lot of the skin around the ulcer is actually somewhat irritating at this point this seems to be more due to the dressing causing irritation from the adhesive that anything else. Fortunately there is no signs of active infection at this time. 11/24/18 on evaluation today patient appears to be doing a little worse in regard to his overall appearance of his lower extremity ulcer. There's more erythema and warmth around the wound unfortunately. He is currently on doxycycline which he has been on for some time. With that being said I'm not sure that seems to be helping with what appears to possibly be an acute cellulitis with regard to his left lower extremity ulcer. No fevers, chills, nausea, or vomiting noted at this time. 12/08/18 on evaluation today patient's wounds actually appears to  be doing significantly better compared to his last evaluation. He has been using Santyl along with alternating tripling about appointment as well as the steroid cream seems to be doing quite well and the wound is showing signs of improvement which is excellent news. Fortunately there's no evidence of infection and in fact his culture came back negative with only normal skin flora noted. 12/21/2018 upon evaluation today patient actually appears to be doing excellent with regard to his ulcer. This is actually the best that I have seen it since have been helping to take care of him. It is both smaller as well as less slough noted on the surface of the wound and seems to be showing signs of good improvement with new skin growing from the edges. He has been using just the triamcinolone he does wonder if he can get a refill of that ointment today. 01/04/2019 upon evaluation today patient actually appears to be doing well with regard to his left lateral lower extremity ulcer. With that being said it does not appear to be that he is doing quite as well as last time as far as progression is concerned. There does not appear to be any signs of infection or significant irritation which is good news. With that being said I do believe that he may benefit from switching to a collagen based dressing based on how clean The wound appears. 01/18/2019 on evaluation today patient actually appears to be doing well with regard to his wound on the left lower extremity. He is not made a lot of progress compared to where we were previous but nonetheless does seem to be doing okay at this time which is good news. There is no signs of active infection which is also good news. My only concern currently is I do wish we can get him into utilizing the collagen dressing his insurance would not pay for the supplies that we ordered although it appears that he may be able to order this through his supply company that he typically utilizes.  This is [employer]. Nonetheless he did try to order it during the office visit today and it appears this did go through. We will see if he can get that it is a different brand but nonetheless he has collagen and I do think will be beneficial. 02/01/2019 on evaluation today patient actually appears to be doing a little worse today in regard to the overall size of his wounds. Fortunately there  is no signs of active infection at this time. That is visually. Nonetheless when this is happened before it was due to infection. For that reason were somewhat concerned about that this time as well. 02/08/2019 on evaluation today patient unfortunately appears to be doing slightly worse with regard to his wound upon evaluation today. Is measuring a little deeper and a little larger unfortunately. I am not really sure exactly what is causing this to enlarge he actually did see his dermatologist she is going to see about initiating Humira for him. Subsequently she also did do steroid injections into the wound itself in the periphery. Nonetheless still nonetheless he seems to be getting a little bit larger he is gone back to just using the steroid cream topically which I think is appropriate. I would say hold off on the collagen for the time being is definitely a good thing to do. Based on the culture results which we finally did get the final result back regarding it shows staph as the bacteria noted again that can be a normal skin bacteria based on the fact however he is having increased drainage and worsening of the wound measurement wise I would go ahead and place him on an antibiotic today I do believe for this. 02/15/2019 on evaluation today patient actually appears to be doing somewhat better in regard to his ulcer. There is no signs of worsening at this time I did review his culture results which showed evidence of Staphylococcus aureus but not MRSA. Again this could just be more related to the normal skin  bacteria although he states the drainage has slowed down quite a bit he may have had a mild infection not just colonization. And was much smaller and then since around10/04/2019 on evaluation today patient appears to be doing unfortunately worse as far as the size of the wound. I really feel like that this is steadily getting larger again it had been doing excellent right at the beginning of September we have seen a steady increase in the area of the wound it is almost 2-1/2 times the size it was on September 1. Obviously this is a bad trend this is not wanting to see. For that reason we went back to using just the topical triamcinolone cream which does seem to help with inflammation. I checked him for bacteria by way of culture and nothing showed positive there. I am considering giving him a short course of a tapering steroid Dosepak today to see if that is can be beneficial for him. The patient is in agreement with giving that a try. 03/08/2019 on evaluation today patient appears to be doing very well in comparison to last evaluation with regard to his lower extremity ulcer. This is showing signs of less inflammation and actually measuring slightly smaller compared to last time every other week over the past month and a half he has been measuring larger larger larger. Nonetheless I do believe that the issue has been inflammation the prednisone does seem to [facility], [facility] ([id number]) have been beneficial for him which is good news. No fevers, chills, nausea, vomiting, or diarrhea. 03/22/2019 on evaluation today patient appears to be doing about the same with regard to his leg ulcer. He has been tolerating the dressing changes without complication. With that being said the wound seems to be mostly arrested at its current size but really is not making any progress except for when we prescribed the prednisone. He did show some signs of dropping as far as  the overall size of the wound during that interval  week. Nonetheless this is something he is not on long-term at this point and unfortunately I think he is getting need either this or else the Humira which his dermatologist has discussed try to get approval for. With that being said he will be seeing his dermatologist on the 11th of this month that is November. 04/19/2019 on evaluation today patient appears to be doing really about the same the wound is measuring slightly larger compared to last time I saw him. He has not been into the office since November 2 due to the fact that he unfortunately had Covid as that his entire family. He tells me that it was rough but they did pull-through and he seems to be doing much better. Fortunately there is no signs of active infection at this time. No fevers, chills, nausea, vomiting, or diarrhea. 05/10/2019 on evaluation today patient unfortunately appears to be doing significantly worse as compared to last time I saw him. He does tell me that he has had his first dose of Humira and actually is scheduled to get the next one in the upcoming week. With that being said he tells me also that in the past several days he has been having a lot of issues with green drainage she showed me a picture this is more blue-green in color. He is also been having issues with increased sloughy buildup and the wound does appear to be larger today. Obviously this is not the direction that we want everything to take based on the starting of his Humira. Nonetheless I think this is definitely a result of likely infection and to be honest I think this is probably Pseudomonas causing the infection based on what I am seeing. 05/24/2019 on evaluation today patient unfortunately appears to be doing significantly worse compared to his prior evaluation with me 2 weeks ago. I did review his culture results which showed that he does have Staph aureus as well as Pseudomonas noted on the culture. Nonetheless the Levaquin that I prescribed for him  does not appear to have been appropriate and in fact he tells me he is no longer experiencing the green drainage and discharge that he had at the last visit. Fortunately there is no signs of active infection at this time which is good news although the wound has significantly worsened it in fact is much deeper than it was previous. We have been utilizing up to this point triamcinolone ointment as the prescription topical of choice but at this time I really feel like that the wound is getting need to be packed in order to appropriately manage this due to the deeper nature of the wound. Therefore something along the lines of an alginate dressing may be more appropriate. 05/31/2019 upon inspection today patient's wound actually showed signs of doing poorly at this point. Unfortunately he just does not seem to be making any good progress despite what we have tried. He actually did go ahead and pick up the Cipro and start taking that as he was noticing more green drainage he had previously completed the Levaquin that I prescribed for him as well. Nonetheless he missed his appointment for the seventh last week on Wednesday with the wound care center and [name] where his dermatologist referred him. Obviously I do think a second opinion would be helpful at this point especially in light of the fact that the patient seems to be doing so poorly despite the fact  that we have tried everything that I really know how at this point. The only thing that ever seems to have helped him in the past is when he was on high doses of continual steroids that did seem to make a difference for him. 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 06/07/2019 upon evaluation today patient unfortunately appears to be doing worse yet again with regard to his wound. In fact I  am starting to question whether or not he may have a fluid pocket in the muscle at this point based on the bulging and the soft appearance to the central portion of the muscle area. There is not anything draining from the muscle itself at this time which is good news but nonetheless the wound is expanding. I am not really seeing any results of the Humira as far as overall wound progression based on what I am seeing at this point. The patient has been referred for second opinion with regard to his wound to the [facility] wound care center by his dermatologist which I definitely am not in opposition to. Unfortunately we tried multiple dressings in the past including collagen, alginate, and at one point even Hydrofera Blue. With that being said he is never really used it for any significant amount of time due to the fact that he often complains of pain associated with these dressings and then will go back to either using the Santyl which she has done intermittently or more frequently the triamcinolone. He is also using his own compression stockings. We have wrapped him in the past but again that was something else that he really was not a big fan of. 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 He seemed to do much better when he was on the steroids although honestly I understand that the steroids are not necessarily the best medication to be on long-term obviously 06/14/2019 on evaluation today patient appears to be doing actually a little bit better with regard to the overall appearance with his leg. Unfortunately he does continue to have issues with what appears to be some fluid underneath  the muscle although he did see the wound specialty center at [facility] last week their main goals were to see about infusion therapy in place of the Humira as they feel like that is not quite strong enough. They also recommended that we continue with the treatment otherwise as we are they felt like that was appropriate and they are okay with him continuing to follow-up here with [place] in that regard. With that being said they are also sending him to the vein specialist there to see about vein stripping and if that would be of benefit for him. Subsequently they also did not really address whether or not an ultrasound of the muscle area to see if there is anything that needs to be addressed here would be appropriate or not. For that reason I discussed this with him last week I think we  may proceed down that road at this point. 06/21/2019 upon evaluation today patient's wound actually appears to be doing slightly better compared to previous evaluations. I do believe that he has made a difference with regard to the progression here with the use of oral steroids. Again in the past has been the only thing that is really calm things down. He does tell me that from [facility] is gotten a good news from there that there are no further vein stripping that is necessary at this point. I do not have that available for review today although the patient did relay this to me. He also did obtain and have the ultrasound of the wound completed which I did sign off on today. It does appear that there is no fluid collection under the muscle this is likely then just edematous tissue in general. That is also good news. Overall I still believe the inflammation is the main issue here. He did inquire about the possibility of a wound VAC again with the muscle protruding like it is I am not really sure whether the wound VAC is necessarily ideal or not. That is something we will have to consider although I do believe he may need compression  wrapping to try to help with edema control which could potentially be of benefit. 06/28/2019 on evaluation today patient appears to be doing slightly better measurement wise although this is not terribly smaller he least seems to be trending towards that direction. With that being said he still seems to have purulent drainage noted in the wound bed at this time. He has been on Levaquin followed by Cipro over the past month. Unfortunately he still seems to have some issues with active infection at this time. I did perform a culture last week in order to evaluate and see if indeed there was still anything going on. Subsequently the culture did come back showing Pseudomonas which is consistent with the drainage has been having which is blue-green in color. He also has had an odor that again was somewhat consistent with Pseudomonas as well. Long story short it appears that the culture showed an intermediate finding with regard to how well the Cipro will work for the Pseudomonas infection. Subsequently being that he does not seem to be clearing up and at best what we are doing is just keeping this at [place] I think he may need to see infectious disease to discuss IV antibiotic options. SARGENT, MANKEY ([id number]) 07/05/2019 upon evaluation today patient appears to be doing okay in regard to his leg ulcer. He has been tolerating the dressing changes at this point without complication. Fortunately there is no signs of active infection at this time which is good news. No fevers, chills, nausea, vomiting, or diarrhea. With that being said he does have an appointment with infectious disease tomorrow and his primary care on Wednesday. Again the reason for the infectious disease referral was due to the fact that he did not seem to be fully resolving with the use of oral antibiotics and therefore we were thinking that IV antibiotic therapy may be necessary secondary to the fact that there was an intermediate finding for  how effective the Cipro may be. Nonetheless again he has been having a lot of purulent and even green drainage. Fortunately right now that seems to have calmed down over the past week with the reinitiation of the oral antibiotic. Nonetheless we will see what Dr. Megan Salon has to say. 07/12/2019 upon evaluation today patient appears to be  doing about the same at this point in regard to his left lower extremity ulcer. Fortunately there is no signs of active infection at this time which is good news I do believe the Levaquin has been beneficial I did review Dr. Hale Bogus note and to be honest I agree that the patient's leg does appear to be doing better currently. What we found in the past as he does not seem to really completely resolve he will stop the antibiotic and then subsequently things will revert back to having issues with blue-green drainage, increased pain, and overall worsening in general. Obviously that is the reason I sent him back to infectious disease. 07/19/2019 upon evaluation today patient appears to be doing roughly the same in size there is really no dramatic improvement. He has started back on the Levaquin at this point and though he seems to be doing okay he did still have a lot of blue/green drainage noted on evaluation today unfortunately. I think that this is still indicative more likely of a Pseudomonas infection as previously noted and again he does see Dr. Megan Salon in just a couple of days. I do not know that were really able to effectively clear this with just oral antibiotics alone based on what I am seeing currently. Nonetheless we are still continue to try to manage as best we can with regard to the patient and his wound. I do think the wrap was helpful in decreasing the edema which is excellent news. No fevers, chills, nausea, vomiting, or diarrhea. 07/26/2019 upon evaluation today patient appears to be doing slightly better with regard to the overall appearance of the muscle  there is no dark discoloration centrally. Fortunately there is no signs of active infection at this time. No fevers, chills, nausea, vomiting, or diarrhea. Patient's wound bed currently the patient did have an appointment with Dr. Megan Salon at infectious disease last week. With that being said Dr. Megan Salon the patient states was still somewhat hesitant about put him on any IV antibiotics he wanted [place] to repeat cultures today and then see where things go going forward. He does look like Dr. Megan Salon because of some improvement the patient did have with the Levaquin wanted [place] to see about repeating cultures. If it indeed grows the Pseudomonas again then he recommended a possibility of considering a PICC line placement and IV antibiotic therapy. He plans to see the patient back in 1 to 2 weeks. 08/02/2019 upon evaluation today patient appears to be doing poorly with regard to his left lower extremity. We did get the results of his culture back it shows that he is still showing evidence of Pseudomonas which is consistent with the purulent/blue-green drainage that he has currently. Subsequently the culture also shows that he now is showing resistance to the oral fluoroquinolones which is unfortunate as that was really the only thing to treat the infection prior. I do believe that he is looking like this is going require IV antibiotic therapy to get this under control. Fortunately there is no signs of systemic infection at this time which is good news. The patient does see Dr. Megan Salon tomorrow. 08/09/2019 upon evaluation today patient appears to be doing better with regard to his left lower extremity ulcer in regard to the overall appearance. He is currently on IV antibiotic therapy. As ordered by Dr. Megan Salon. Currently the patient is on ceftazidime which she is going to take for the next 2 weeks and then follow-up for 4 to 5-week appointment with Dr. Megan Salon.  The patient started this this past Friday  symptoms have not for a total of 3 days currently in full. 08/16/2019 upon evaluation today patient's wound actually does show muscle in the base of the wound but in general does appear to be much better as far as the overall evidence of infection is concerned. In fact I feel like this is for the most part cleared up he still on the IV antibiotics he has not completed the full course yet but I think he is doing much better which is excellent news. 08/23/2019 upon evaluation today patient appears to be doing about the same with regard to his wound at this point. He tells me that he still has pain unfortunately. Fortunately there is no evidence of systemic infection at this time which is great news. There is significant muscle protrusion. 09/13/19 upon evaluation today patient appears to be doing about the same in regard to his leg unfortunately. He still has a lot of drainage coming from the ulceration there is still muscle exposed. With that being said the patient's last wound culture still showed an intermediate finding with regard to the Pseudomonas he still having the bluish/green drainage as well. Overall I do not know that the wound has completely cleared of infection at this point. Fortunately there is no signs of active infection systemically at this point which is good news. 09/20/2019 upon evaluation today patient's wound actually appears to be doing about the same based on what I am seeing currently. I do not see any signs of systemic infection he still does have evidence of some local infection and drainage. He did see Dr. Megan Salon last week and Dr. Megan Salon states that he probably does need a different IV antibiotic although he does not want to put him on this until the patient begins the Remicade infusion which is actually scheduled for about 10 days out from today on 13 May. Following that time Dr. Megan Salon is good to see him back and then will evaluate the feasibility of starting him on the  IV antibiotic therapy once again at that point. I do not disagree with this plan I do believe as Dr. Megan Salon stated in his note that I reviewed today that the patient's issue is multifactorial with the pyoderma being 1 aspect of this that were hoping the Remicade will be helpful for her. In the meantime I think the gentamicin is, helping to keep things under decent okay control in regard to the ulcer. 09/27/2019 upon evaluation today patient appears to be doing about the same with regard to his wound still there is a lot of muscle exposure though he does have some hyper granulation tissue noted around the edge and actually some granulation tissue starting to form over the muscle which is actually good news. Fortunately there is no evidence of active infection which is also good news. His pain is less at this point. 5/21; this is a patient I have not seen in a long time. 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 He has a large wound on the left lateral leg with protruding muscle. He comes in the clinic today showing the same area on his left medial ankle. He says there is been a spot there for some time although we have not previously defined this. Today he has a clearly defined area with slight amount of skin breakdown surrounded by raised areas with a purplish hue in color. This is not painful he says it is irritated.  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 He still has muscle protruding from the lateral portion of his left leg but fortunately the new area that they were concerned about at his last visit does not appear to have opened at this point. He is currently on Remicade infusions and seems to be doing better in my opinion in fact the wound itself seems to be overall much better. The purplish discoloration that he did have seems to have resolved  and I think that is a good sign that hopefully the Remicade is doing its job. He does have some biofilm noted over the surface of the wound. 11/01/2019 on evaluation today patient's wound actually appears to be doing excellent at this time. Fortunately there is no evidence of active infection and overall I feel like he is making great progress. The Remicade seems to be due excellent job in my opinion. SAMSON, RALPH ([id number]) 11/08/19 evaluation today vision actually appears to be doing quite well with regard to his weight ulcer. He's been tolerating dressing changes without complication. Fortunately there is no evidence of infection. No fevers, chills, nausea, or vomiting noted at this time. Overall states that is having more itching than pain which is actually a good sign in my opinion. 12/13/2019 upon evaluation today patient appears to be doing well today with regard to his wound. He has been tolerating the dressing changes without complication. Fortunately there is no sign of active infection at this time. No fevers, chills, nausea, vomiting, or diarrhea. Overall I feel like the infusion therapy has been very beneficial for him. 01/06/2020 on evaluation today patient appears to be doing well with regard to his wound. This is measuring smaller and actually looks to be doing better. Fortunately there is no signs of active infection at this point. No fevers, chills, nausea, vomiting, or diarrhea. With that being said he does still have the blue-green drainage but this does not seem to be causing any significant issues currently. He has been using the gentamicin that does seem to be keeping things under decent control at this point. He goes later this morning for his next infusion therapy for the pyoderma which seems to also be very beneficial. 02/07/2020 on evaluation today patient appears to be doing about the same in regard to his wounds currently. Fortunately there is no signs of active infection  systemically he does still have evidence of local infection still using gentamicin. He also is showing some signs of improvement albeit slowly I do feel like we are making some progress here. 02/21/2020 upon evaluation today patient appears to be making some signs of improvement the wound is measuring a little bit smaller which is great news and overall I am very pleased with where he stands currently. He is going to be having infusion therapy treatment on the 15th of this month. Fortunately there is no signs of active infection at this time. 03/13/2020 I do believe patient's wound is actually showing some signs of improvement here which is great news. He has continue with the infusion therapy through rheumatology/dermatology at [facility]. That does seem to be beneficial. I still think he gets as much benefit from this as he did from the prednisone initially but nonetheless obviously this is less harsh on his body that the prednisone as far as they are concerned. 03/31/2020 on evaluation today patient's wound actually showing signs of some pretty good improvement in regard  to the overall appearance of the wound bed. There is still muscle exposed though he does have some epithelial growth around the edges of the wound. Fortunately there is no signs of active infection at this time. No fevers, chills, nausea, vomiting, or diarrhea. 04/24/2020 upon evaluation today patient appears to be doing about the same in regard to his leg ulcer. He has been tolerating the dressing changes without complication. Fortunately there is no signs of active infection at this time. No fevers, chills, nausea, vomiting, or diarrhea. With that being said he still has a lot of irritation from the bandaging around the edges of the wound. We did discuss today the possibility of a referral to plastic surgery. 05/22/2020 on evaluation today patient appears to be doing well with regard to his wounds all things considered. He has not been able  to get the Chantix apparently there is a recall nurse that I was unaware of put out by [employer] involuntarily. Nonetheless for now I am and I have to do some research into what may be the best option for him to help with quitting in regard to smoking and we discussed that today. 06/26/2020 upon evaluation today patient appears to be doing well with regard to his wound from the standpoint of infection I do not see any signs of infection at this point. With that being said unfortunately he is still continuing to have issues with muscle exposure and again he is not having a whole lot of new skin growth unfortunately. There does not appear to be any signs of active infection at this time. No fevers, chills, nausea, vomiting, or diarrhea. 07/10/2020 upon evaluation today patient appears to be doing a little bit more poorly currently compared to where he was previous. I am concerned currently about an active infection that may be getting worse especially in light of the increased size and tenderness of the wound bed. No fevers, chills, nausea, vomiting, or diarrhea. 07/24/2020 upon evaluation today patient appears to be doing poorly in regard to his leg ulcer. He has been tolerating the dressing changes without complication but unfortunately is having a lot of discomfort. 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 Subsequently I think he is going require possibly IV antibiotics to get this under control. I am very concerned about the severity of his infection and the amount of discomfort he is having. 07/31/2020 upon evaluation today patient appears to be doing about the same in regard to his leg wound. He did see Dr. Megan Salon and Dr. Megan Salon is actually going to start him on IV antibiotics. He goes for the PICC line tomorrow. With that being said there do not have that run for 2 weeks and then see how things are doing and depending on how he  is progressing they may extend that a little longer. Nonetheless I am glad this is getting ready to be in place and definitely feel it may help the patient. In the meantime is been using mainly triamcinolone to the wound bed has an anti-inflammatory. 08/07/2020 on evaluation today patient appears to be doing well with regard to his wound compared even last week. In the interim he has gotten the PICC line placed and overall this seems to be doing excellent. There does not appear to be any evidence of infection which is great news systemically although locally of course has had the infection this appears to be improving with the use of the antibiotics. 08/14/2020 upon  evaluation today patient's wound actually showing signs of excellent improvement. Overall the irritation has significantly improved the drainage is back down to more of a normal level and his pain is really pretty much nonexistent compared to what it was. Obviously I think that this is significantly improved secondary to the IV antibiotic therapy which has made all the difference in the world. Again he had a resistant form of Pseudomonas for which oral antibiotics just was not cutting it. Nonetheless I do think that still we need to consider the possibility of a surgical closure for this wound is been open so long and to be honest with muscle exposed I think this can be very hard to get this to close outside of this although definitely were still working to try to do what we can in that regard. 08/21/2020 upon evaluation today patient appears to be doing very well with regard to his wounds on the left lateral lower extremity/calf area. Fortunately there does not appear to be signs of active infection which is great news and overall very pleased with where things stand today. He is actually wrapping up his treatment with IV antibiotics tomorrow. After that we will see where things go from there. 08/28/2020 upon evaluation today patient appears  to be doing decently well with regard to his leg ulcer. There does not appear to be any signs of active infection which is great news and overall very pleased with where things stand today. No fevers, chills, nausea, vomiting, or diarrhea. 09/18/2020 upon evaluation today patient appears to be doing well with regard to his infection which I feel like is better. Unfortunately he is not doing as well with regard to the overall size of the wound which is not nearly as good at this point. I feel like that he may be having an issue here with the pyoderma being somewhat out of control. I think that he may benefit from potentially going back and talking to the dermatologist about YUNIEL, BLANEY ([id number]) what to do from the pyoderma standpoint. I am not certain if the infusions are helping nearly as much is what the prednisone did in the past. 10/02/2020 upon evaluation today patient appears to be doing well with regard to his leg ulcer. He did go to the Psychiatric nurse. Unfortunately they feel like there is a 10% chance that most that he would be able to heal and that the skin graft would take. Obviously this has led him to not be able to go down that path as far as treatment is concerned. Nonetheless he does seem to be doing a little bit better with the prednisone that I gave him last time. I think that he may need to discuss with dermatology the possibility of long-term prednisone as that seems to be what is most helpful for him to be perfectly honest. I am not sure the Remicade is really doing the job. 10/17/2020 upon evaluation today patient appears to be doing a little better in regard to his wound. In fact the case has been since we did the prednisone on May 2 for him that we have noticed a little bit of improvement each time we have seen a size wise as well as appearance wise as well as pain wise. I think the prednisone has had a greater effect then the infusion therapy has to be perfectly honest. With  that being said the patient also feels significantly better compared to what he was previous. All of this is good news but  nonetheless I am still concerned about the fact that again we are really not set up to long-term manage him as far as prednisone is concerned. Obviously there are things that you need to be watched I completely understand the risk of prednisone usage as well. 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 With all that being said I do believe that we can give him another round of the prednisone which she is requesting today because of the improvement that he seen since we did that first round. 10/30/2020 upon evaluation today patient's wound actually is showing signs of doing quite well. There does not appear to be any evidence of infection which is great news and overall very pleased with where things stand today. No fevers, chills, nausea, vomiting, or diarrhea. He tells me that the prednisone still has seem to have helped he wonders if we can extend that for just a little bit longer. He did not have the appointment with a dermatologist although he did have an infusion appointment last Friday. That was at [facility]. With that being said he tells me he could not do both that as well as the appointment with the physician on the same day therefore that is can have to be rescheduled. I really want to see if there is anything they feel like that could be done differently to try to help this out as I am not really certain that the infusions are helping significantly here. 11/13/2020 upon evaluation today patient unfortunately appears to be doing somewhat poorly in regard to his wound I feel like this is actually worsening from the standpoint of the pyoderma spreading. I still feel like that he may need something different as far as trying to manage this going forward. Again we did the prednisone unfortunately his blood sugars are not doing so well  because of this. Nonetheless I believe that the patient likely needs to try topical steroid. We have done triamcinolone for a while I think going with something stronger such as clobetasol could be beneficial again this is not something I do lightly I discussed this with the patient that again this does not normally put underneath an occlusive dressing. Nonetheless I think a thin film as such could help with some of the stronger anti-inflammatory effects. We discussed this today. He would like to try to give this a trial for the next couple weeks. I definitely think that is something that we can do. Evaluate7/03/2021 and today patient's wound bed actually showed signs of doing really about the same. There was a little expansion of the size of the wound and that leading edge that we done looking out although the clobetasol does seem to have slowed this down a bit in my opinion. There is just 1 small area that still seems to be progressing based on what I see. Nonetheless I am concerned about the fact this does not seem to be improving if anything seems to be doing a little bit worse. I do not know that the infusions are really helping him much as next infusion is August 5 his appointment with dermatology is July 25. Either way I really think that we need to have a conversation potentially about this and I am actually going to see if I can talk with Dr. Lillia Carmel in order to see where things stand as well. 12/11/2020 upon evaluation today patient appears to be doing worse in regard to his leg ulcer. Unfortunately  I just do not think this is making the progress that I would like to see at this point. Honestly he does have an appointment with dermatology and this is in 2 days. I am wondering what they may have to offer to help with this. Right now what I am seeing is that he is continuing to show signs of worsening little by little. Obviously that is not great at all. Is the exact opposite of what we are  looking for. 12/18/2020 upon evaluation today patient appears to be doing a little better in regard to his wound. The dermatologist actually did do some steroid injections into the wound which does seem to have been beneficial in my opinion. That was on the 25th already this looks a little better to me than last time I saw him. With that being said we did do a culture and this did show that he has Staph aureus noted in abundance in the wound. With that being said I do think that getting him on an oral antibiotic would be appropriate as well. Also think we can compression wrap and this will make a difference as well. 12/28/2020 upon evaluation today patient's wound is actually showing signs of doing much better. I do believe the compression wrap is helping he has a lot of drainage but to be honest I think that the compression is helping to some degree in this regard as well as not draining through which is also good news. No fevers, chills, nausea, vomiting, or diarrhea. 01/04/2021 upon evaluation today patient appears to be doing well with regard to his wound. Overall things seem to be doing quite well. He did have a little bit of reaction to the CarboFlex Sorbact he will be using that any longer. With that being said he is controlled as far as the drainage is concerned overall and seems to be doing quite well. I do not see any signs of active infection at this time which is great news. No fevers, chills, nausea, vomiting, or diarrhea. 01/11/2021 upon evaluation today patient appears to be doing well with regard to his wounds. He has been tolerating the dressing changes without complication. Fortunately there does not appear to be any signs of active infection at this time which is great news. Overall I am extremely pleased with where we stand currently. No fevers, chills, nausea, vomiting, or diarrhea. Where using clobetasol in the wound bed he has a lot of new skin growth which is awesome as  well. 01/18/2021 upon evaluation today patient appears to be doing very well in regard to his leg ulcer. He has been tolerating the dressing changes without complication. Fortunately there does not appear to be any signs of active infection which is great news. In general I think that he is making excellent progress 01/25/2021 upon evaluation today patient appears to be doing well with regard to his wound on the leg. I am actually extremely pleased with where things stand today. There does not appear to be any signs of active infection which is great news and overall I think that we are definitely headed in the appropriate direction based on what I am seeing currently. There does not appear to be any signs of active infection also excellent news. 02/06/2021 upon evaluation today patient appears to be doing well with regard to his wound. Overall visually this is showing signs of significant improvement which is great news. I do not see any signs of active infection systemically which is great even locally  I do not think that we are seeing any major complications here. We did do fluorescence imaging with the [employer] DX today. The patient does have some odor and drainage noted and again this is something that I think would benefit him to probably come more frequently for nurse visits. MIZAEL, SAGAR ([id number]) Electronic Signature(s) Signed: 02/06/2021 9:26:50 AM By: Worthy Keeler PA-C Entered By: Worthy Keeler on 02/06/2021 09:26:50 NAYTHEN, HEIKKILA ([id number]) -------------------------------------------------------------------------------- Physical Exam Details Patient Name: [name] Date of Service: 02/06/2021 8:15 AM Medical Record Number: [id number] Patient Account Number: [id number] Date of Birth/Sex: Apr 24, 1981 (40 y.o. M) Treating RN: Dolan Amen Primary Care Provider: [name] Other Clinician: Referring Provider: [name] Treating Provider/Extender: [name] Cliche in Treatment: 217 Constitutional Well-nourished and well-hydrated in no acute distress. Respiratory normal breathing without difficulty. Psychiatric this patient is able to make decisions and demonstrates good insight into disease process. Alert and Oriented x 3. pleasant and cooperative. Notes Upon inspection patient's wound bed actually showed signs of good granulation and epithelization at this point. I actually see a lot of improvement and the measurements are indeed smaller today even compared to last time I saw him 2 weeks ago. In fact every time I see him were seeing improvement in the past several weeks and really even month and a half or so. On the fluorescence imaging today we did see evidence of blush and red signals noted and subsequently the good news is there did not appear to be any cyan fluorescence which in the past he has had issues with a resistant form of Pseudomonas so this is actually very encouraging. Right now the patient is currently on the Bactrim DS that I really up for him for the last regimen last week. He has that for roughly another week at this point. Electronic Signature(s) Signed: 02/06/2021 9:27:54 AM By: Worthy Keeler PA-C Entered By: Worthy Keeler on 02/06/2021 09:27:53 CORDE, ANTONINI ([id number]) -------------------------------------------------------------------------------- Physician Orders Details Patient Name: [name] Date of Service: 02/06/2021 8:15 AM Medical Record Number: [id number] Patient Account Number: [id number] Date of Birth/Sex: 17-Feb-1981 (40 y.o. 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 o Nurse Visit as needed - twice a week Bathing/ Shower/ Hygiene o Clean wound with Normal Saline or wound cleanser. o Other: - Apply dakins soaked gauze on wound after cleansing wound IN OFFICE ONLY Edema Control - Lymphedema / Segmental Compressive Device / Other o Optional: One layer of unna paste to top of compression wrap (to act as an anchor). o Elevate, Exercise Daily and Avoid Standing for Long Periods of Time. o Elevate legs to the level of the heart and pump ankles as often as possible o Elevate leg(s) parallel to the floor when sitting. Medications-Please add to medication list. o P.O. Antibiotics - Finish antibiotics Wound Treatment Wound #1 - Lower Leg Wound Laterality: Left, Lateral Cleanser: Wound Cleanser 3 x Per Week/30 Days Discharge Instructions: Wash your hands with soap and water. Remove old dressing, discard into plastic bag and place into  trash. Cleanse the wound with Wound Cleanser prior to applying a clean dressing using gauze sponges, not tissues or cotton balls. Do not scrub or use excessive force. Pat dry using gauze sponges, not tissue or cotton balls. Peri-Wound Care: Desitin Maximum Strength Ointment 4 (oz) 3 x Per Week/30 Days Discharge Instructions: Apply from 12 o'clock to 6 o'clock periwound Peri-Wound Care: Triamcinolone Acetonide Cream, 0.1%, 15 (g) tube 3 x Per Week/30 Days Discharge Instructions: Apply on posterior leg for itchiness and irritation Topical: Clobetasol Propionate ointment 0.05%, 60 (g) tube 3 x Per Week/30 Days Discharge Instructions: Apply to 7 o'clock depth and 1 o'clock depth Primary Dressing: Calcium Alginate Dressing, 6x6  (in/in) 3 x Per Week/30 Days Discharge Instructions: Apply to wound bed Secondary Dressing: Drawtex Hydroconductive Wound Dressing, 4x4 (in/in) 3 x Per Week/30 Days Discharge Instructions: Apply over calcium alginate Secondary Dressing: Xtrasorb Large 6x9 (in/in) 3 x Per Week/30 Days Discharge Instructions: Cover entire wound Compression Wrap: Medichoice 4 layer Compression System, 35-40 mmHG (Generic) 3 x Per Week/30 Days Discharge Instructions: Apply multi-layer wrap as directed. BURREL, LEGRAND ([id number]) Electronic Signature(s) Signed: 02/06/2021 4:47:24 PM By: Dolan Amen RN Signed: 02/06/2021 6:22:10 PM By: Worthy Keeler PA-C Entered By: Dolan Amen on 02/06/2021 09:59:34 LINC, RENNE ([id number]) -------------------------------------------------------------------------------- Problem List Details Patient Name: [name] Date of Service: 02/06/2021 8:15 AM Medical Record Number: [id number] Patient Account Number: [id number] Date of Birth/Sex: 03-Aug-1980 (40 y.o. 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 to breakdown of skin 10/08/2019 No Yes I87.2 Venous insufficiency (chronic) (peripheral) 12/04/2016 No Yes L03.116 Cellulitis of left lower limb 05/24/2019 No Yes F17.208 Nicotine dependence, unspecified, with other nicotine-induced disorders 04/24/2020 No Yes Inactive Problems ICD-10 Code Description Active Date Inactive Date L97.213 Non-pressure chronic ulcer of right calf with necrosis of muscle 04/02/2017 04/02/2017 Resolved Problems Electronic  Signature(s) Signed: 02/06/2021 8:15:16 AM By: Worthy Keeler PA-C Entered By: Worthy Keeler on 02/06/2021 08:15:16 Reale, Nyko ([id number]) -------------------------------------------------------------------------------- Progress Note Details Patient Name: [name] Date of Service: 02/06/2021 8:15 AM Medical Record Number: [id number] Patient Account Number: [id number] Date of Birth/Sex: 1980/11/15 (40 y.o. M) Treating RN: Dolan Amen Primary Care Provider: [name] Other Clinician: Referring Provider: [name] Treating Provider/Extender: [name] Cliche in Treatment: 217 Subjective Chief Complaint Information obtained from Patient He is here in follow up evaluation for LLE pyoderma ulcer History of Present Illness (HPI) 12/04/16; 40 year old man who comes into the clinic today for review of a wound on the posterior left calf. He tells me that is been there for about a year. He is not a diabetic he does smoke half a pack per day. He was seen in the ER on 11/20/16 felt to have cellulitis around the wound and was given clindamycin. An x-ray did not show osteomyelitis. The patient initially tells me that he has a milk allergy that sets off a pruritic itching rash on his lower legs which she scratches incessantly and he thinks that's what may have set up the wound. He has been using various topical antibiotics and ointments without any effect. He works in a [employer] and is on his feet all day. He does not have  a prior history of wounds however he does have the rash on both lower legs the right arm and the ventral aspect of his left arm. These are excoriations and clearly have had scratching however there are of macular looking areas on both legs including a substantial larger area on the right leg. This does not have an underlying open area. There is no blistering. The patient tells me that 2 years ago in [place] in response to the rash on his legs he saw a  dermatologist who told him he had a condition which may be pyoderma gangrenosum although I may be putting words into his mouth. He seemed to recognize this. On further questioning he admits to a 5 year history of quiesced. ulcerative colitis. He is not in any treatment for this. He's had no recent travel 12/11/16; the patient arrives today with his wound and roughly the same condition we've been using silver alginate this is a deep punched out wound with some surrounding erythema but no tenderness. Biopsy I did did not show confirmed pyoderma gangrenosum suggested nonspecific inflammation and vasculitis but does not provide an actual description of what was seen by the pathologist. I'm really not able to understand this We have also received information from the patient's dermatologist in [place] notes from April 2016. This was a doctor Agarwal-antal. 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 He was prescribed topical steroid high potency under occlusion which helped but at this point the patient did not have a deep punched out wound. 12/18/16; the patient's wound is larger in terms of surface area however this surface looks better and there is less depth. The surrounding erythema also is better. The patient states that the wrap we put on came off 2 days ago when he has been using his compression stockings. He we are in the process of getting a dermatology consult. 12/26/16 on evaluation today patient's left lower extremity wound shows evidence of infection with surrounding erythema noted. He has been tolerating the dressing changes but states that he has noted more discomfort. There is a larger area of erythema surrounding the wound. No fevers, chills, nausea, or vomiting noted at this time. With that being said the wound still does have slough covering the surface. He is not allergic to any medication that he is aware of at this point. In regard to his right lower extremity he had several  regions that are erythematous and pruritic he wonders if there's anything we can do to help that. 01/02/17 I reviewed patient's wound culture which was obtained his visit last week. He was placed on doxycycline at that point. Unfortunately that does not appear to be an antibiotic that would likely help with the situation however the pseudomonas noted on culture is sensitive to Cipro. Also unfortunately patient's wound seems to have a large compared to last week's evaluation. Not severely so but there are definitely increased measurements in general. He is continuing to have discomfort as well he writes this to be a seven out of 10. In fact he would prefer me not to perform any debridement today due to the fact that he is having discomfort and considering he has an active infection on the little reluctant to do so anyway. No fevers, chills, nausea, or vomiting noted at this time. 01/08/17; patient seems dermatology on September 5. I suspect dermatology will want the slides from the biopsy I did sent to their pathologist. I'm not sure if there is a way we can expedite  that. In any case the culture I did before I left on vacation 3 weeks ago showed Pseudomonas he was given 10 days of Cipro and per her description of her intake nurses is actually somewhat better this week although the wound is quite a bit bigger than I remember the last time I saw this. He still has 3 more days of Cipro 01/21/17; dermatology appointment tomorrow. He has completed the ciprofloxacin for Pseudomonas. Surface of the wound looks better however he is had some deterioration in the lesions on his right leg. Meantime the left lateral leg wound we will continue with sample 01/29/17; patient had his dermatology appointment but I can't yet see that note. He is completed his antibiotics. The wound is more superficial but considerably larger in circumferential area than when he came in. This is in his left lateral calf. He also has swollen  erythematous areas with superficial wounds on the right leg and small papular areas on both arms. There apparently areas in her his upper thighs and buttocks I did not look at those. Dermatology biopsied the right leg. Hopefully will have their input next week. 02/05/17; patient went back to see his dermatologist who told him that he had a "scratching problem" as well as staph. He is now on a 30 day course of doxycycline and I believe she gave him triamcinolone cream to the right leg areas to help with the itching [not exactly sure but probably triamcinolone]. She apparently looked at the left lateral leg wound although this was not rebiopsied and I think felt to be ultimately part of the same pathogenesis. He is using sample border foam and changing nevus himself. He now has a new open area on the right posterior leg which was his biopsy site I don't have any of the dermatology notes 02/12/17; we put the patient in compression last week with SANTYL to the wound on the left leg and the biopsy. Edema is much better and the depth of the wound is now at level of skin. Area is still the same Biopsy site on the right lateral leg we've also been using santyl with a border foam dressing and he is changing this himself. 02/19/17; Using silver alginate started last week to both the substantial left leg wound and the biopsy site on the right wound. He is tolerating compression well. Has a an appointment with his primary M.D. tomorrow wondering about diuretics although I'm wondering if the edema problem is actually lymphedema VUK, SKILLERN ([id number]) 02/26/17; the patient has been to see his primary doctor Dr. Jerrel Ivory at [facility] our primary care. She started him on Lasix 20 mg and this seems to have helped with the edema. However we are not making substantial change with the left lateral calf wound and inflammation. The biopsy site on the right leg also looks stable but not really all that  different. 03/12/17; the patient has been to see vein and vascular Dr. Lucky Cowboy. He has had venous reflux studies I have not reviewed these. I did get a call from his dermatology office. 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 They therefore recommended clobetasol and tetracycline i.e. 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 03/26/17; apparently the patient just  had reflux studies not an appointment with Dr. dew. She arrives in clinic today having applied clobetasol for 2-3 weeks. He notes over the last 2-3 days excessive drainage having to change the dressing 3-4 times a day and also expanding erythema. 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 Lillia Carmel at [facility] dermatology locally who kindly saw him at my request. 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 My concern was the degree of erythema medially extending from his left leg wound which was either inflammation from pyoderma or cellulitis. I put him on Augmentin however culture of the wound showed Pseudomonas which is quinolone sensitive. I really don't believe he has cellulitis however in view of everything I will continue and give him a course of Cipro. He is also on doxycycline as an immune modulator for the pyoderma. In addition to his original wound on the left lateral leg with surrounding erythema he has a wound on the right posterior calf which was  an original biopsy site done by dermatology. 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 Saw Dr. Lillia Carmel yesterday. He has been using topical antibiotics to both wound areas his original wound on the left and the biopsies/pathergy area on the right. There is definitely some improvement in the inflammation around the wound on the right although the patient states he has increasing sensitivity of the wounds. He is on prednisone 60 and doxycycline 1 as prescribed by Dr. Lillia Carmel. He is covering the topical antibiotic with gauze and putting this in his own compression stocks and changing this daily. He states that Dr. Lottie Rater did a culture of the left leg wound yesterday 05/07/17; pyoderma gangrenosum. The patient saw Dr. Lillia Carmel yesterday and has a follow-up with her in one month. He is still using topical antibiotics to both wounds although he can't recall exactly what type. He is still on prednisone 60 mg. Dr. Lillia Carmel stated that the doxycycline could stop if we were in agreement. 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 The right medial leg was induced by biopsy/pathergy. The area on the right is essentially healed. 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 The right medial leg has closed and remains closed. He is still on prednisone 60. He tells me he missed his last dermatology appointment with Dr. Lillia Carmel but will make another appointment. He reports that her blood sugar at a recent screen in [place] was high 200's. He was 180 today. 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 I think he is going to require still much longer prednisone perhaps another 3 months before attempting to taper. In the meantime his wound is a lot better.  Smaller. He is cleaning this off daily and applying topical antibiotics. When he was last in the clinic I thought about changing to [facility] and actually put in a couple of calls to dermatology although probably not during their business hours. In any case the wound looks better smaller I don't think there is any need to change what  he is doing 08/06/17-he is here in follow up evaluation for pyoderma left leg ulcer. He continues on oral prednisone. He has been using triple antibiotic ointment. There is surface debris and we will transition to [facility] and have him return in 2 weeks. He has lost 30 pounds since his last appointment with lifestyle modification. He may benefit from topical steroid cream for treatment this can be considered at a later date. 08/22/17 on evaluation today patient appears to actually be doing rather well in regard to his left lateral lower extremity ulcer. He has actually been managed by Dr. Dellia Nims most recently. Patient is currently on oral steroids at this time. This seems to have been of benefit for him. Nonetheless his last visit was actually with Leah on 08/06/17. Currently he is not utilizing any topical steroid creams although this could be of benefit as well. No fevers, chills, nausea, or vomiting noted at this time. 09/05/17 on evaluation today patient appears to be doing better in regard to his left lateral lower extremity ulcer. He has been tolerating the dressing changes without complication. He is using Santyl with good effect. Overall I'm very pleased with how things are standing at this point. Patient likewise is happy that this is doing better. 09/19/17 on evaluation today patient actually appears to be doing rather well in regard to his left lateral lower extremity ulcer. 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 He's not having any significant pain. 10/03/17 on evaluation today patient  appears to be doing excellent in regard to his lower extremity wound on the left secondary to Pyoderma gangrenosum. He has been tolerating the Santyl without complication and in general I feel like he's making good progress. 10/17/17 on evaluation today patient appears to be doing very well in regard to his left lateral lower surety ulcer. He has been tolerating the dressing changes without complication. There does not appear to be any evidence of infection he's alternating the Santyl and the triple antibiotic ointment every other day this seems to be doing well for him. 11/03/17 on evaluation today patient appears to be doing very well in regard to his left lateral lower extremity ulcer. He is been tolerating the dressing changes without complication which is good news. Fortunately there does not appear to be any evidence of infection which is also great news. Overall is doing excellent they are starting to taper down on the prednisone is down to 40 mg at this point it also started topical clobetasol for him. 11/17/17 on evaluation today patient appears to be doing well in regard to his left lateral lower surety ulcer. He's been tolerating the dressing changes without complication. He does note that he is having no pain, no excessive drainage or discharge, and overall he feels like things are going about how he would expect and hope they would. Overall he seems to have no evidence of infection at this time in my opinion which is good news. 12/04/17-He is seen in follow-up evaluation for right lateral lower extremity ulcer. He has been applying topical steroid cream. Today's measurement show slight increase in size. Over the next 2 weeks we will transition to every other day Santyl and steroid cream. He has been encouraged to monitor for changes and notify clinic with any concerns 12/15/17 on evaluation today patient's left lateral motion the ulcer and fortunately is doing worse again at this point. This  just since last week  to this week has close to doubled in size according to the patient. I did not seeing last week's I do not have a visual to compare this to in our system was also down so we do not have all the charts and at this point. Nonetheless it does have me somewhat concerned in regard to the fact that again he was worried enough about it he has contact the dermatology that placed them back on the full strength, 50 mg a day of the prednisone that he was taken previous. He continues to alternate using clobetasol along with Santyl at this point. He is obviously somewhat frustrated. MACHAEL, RAINE ([id number]) 12/22/17 on evaluation today patient appears to be doing a little worse compared to last evaluation. Unfortunately the wound is a little deeper and slightly larger than the last week's evaluation. With that being said he has made some progress in regard to the irritation surrounding at this time unfortunately despite that progress that's been made he still has a significant issue going on here. 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 For that reason I discussed with him today the possibility of perform a wound culture to ensure there's nothing overtly infected. 01/06/18 on evaluation today patient's wound is larger and deeper than previously evaluated. With that being said it did appear that his wound was infected after my last evaluation with him. Subsequently I did end up prescribing a prescription for Bactrim DS which she has been taking and having no complication with. Fortunately there does not appear to be any evidence of infection at this point in time as far as anything spreading, no want to touch, and overall I feel like things are showing signs of improvement. 01/13/18 on evaluation today patient appears to be even a little larger  and deeper than last time. There still muscle exposed in the base of the wound. Nonetheless he does appear to be less erythematous I do believe inflammation is calming down also believe the infection looks like it's probably resolved at this time based on what I'm seeing. No fevers, chills, nausea, or vomiting noted at this time. 01/30/18 on evaluation today patient actually appears to visually look better for the most part. Unfortunately those visually this looks better he does seem to potentially have what may be an abscess in the muscle that has been noted in the central portion of the wound. This is the first time that I have noted what appears to be fluctuance in the central portion of the muscle. With that being said I'm somewhat more concerned about the fact that this might indicate an abscess formation at this location. I do believe that an ultrasound would be appropriate. This is likely something we need to try to do as soon as possible. He has been switch to mupirocin ointment and he is no longer using the steroid ointment as prescribed by dermatology he sees them again next week he's been decreased from 60 to 40 mg of prednisone. 03/09/18 on evaluation today patient actually appears to be doing a little better compared to last time I saw him. There's not as much erythema surrounding the wound itself. He I did review his most recent infectious disease note which was dated 02/24/18. He saw Dr. Michel Bickers in [place]. 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 Patient is now off of antibiotics and they are continually observing this. There seems to be no change in the past two weeks in my pinion based on what the patient says and what I see today compared to what Dr. Megan Salon likely saw two weeks ago. No fevers, chills, nausea, or vomiting noted at this time. 03/23/18 on evaluation today patient's wound  actually appears to be showing signs of improvement which is good news. He is currently still on the Dapsone. He is also working on tapering the prednisone to get off of this and Dr. Lottie Rater is working with him in this regard. Nonetheless overall I feel like the wound is doing well it does appear based on the infectious disease note that I reviewed from Dr. 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 I did also review the results of his ultrasound of left lower extremity which revealed there was a dentist tissue in the base of the wound without an abscess noted. 04/06/18 on evaluation today the patient's left lateral lower extremity ulcer actually appears to be doing fairly well which is excellent news. There does not appear to be any evidence of infection at this time which is also great news. Overall he still does have a significantly large ulceration although little by little he seems to be making progress. He is down to 10 mg a day of the prednisone. 04/20/18 on evaluation today patient actually appears to be doing excellent at this time in regard to his left lower extremity ulcer. He's making signs of good progress unfortunately this is taking much longer than we would really like to see but nonetheless he is making progress. Fortunately there does not appear to be any evidence of infection at this time. No fevers, chills, nausea, or vomiting noted at this time. The patient has not been using the Santyl due to the cost he hadn't got in this field yet. He's mainly been using the antibiotic ointment topically. Subsequently he also tells me that he really has not been scrubbing in the shower I think this would be helpful again as I told him it doesn't have to be anything too aggressive to even make it believe just enough to keep it free of some of the loose slough and biofilm on the wound  surface. 05/11/18 on evaluation today patient's wound appears to be making slow but sure progress in regard to the left lateral lower extremity ulcer. He is been tolerating the dressing changes without complication. Fortunately there does not appear to be any evidence of infection at this time. He is still just using triple antibiotic ointment along with clobetasol occasionally over the area. He never got the Santyl and really does not seem to intend to in my pinion. 06/01/18 on evaluation today patient appears to be doing a little better in regard to his left lateral lower extremity ulcer. He states that overall he does not feel like he is doing as well with the Dapsone as he did with the prednisone. Nonetheless he sees his dermatologist later today and is gonna talk to them about the possibility of going back on the prednisone. Overall again I believe that the wound would be better if you would utilize Santyl but he really does not seem to be interested in going back to the Luana at this point. He has  been using triple antibiotic ointment. 06/15/18 on evaluation today patient's wound actually appears to be doing about the same at this point. Fortunately there is no signs of infection at this time. He has made slight improvements although he continues to not really want to clean the wound bed at this point. He states that he just doesn't mess with it he doesn't want to cause any problems with everything else he has going on. He has been on medication, antibiotics as prescribed by his dermatologist, for a staff infection of his lower extremities which is really drying out now and looking much better he tells me. Fortunately there is no sign of overall infection. 06/29/18 on evaluation today patient appears to be doing well in regard to his left lateral lower surety ulcer all things considering. Fortunately his staff infection seems to be greatly improved compared to previous. He has no signs of  infection and this is drying up quite nicely. He is still the doxycycline for this is no longer on cental, Dapsone, or any of the other medications. His dermatologist has recommended possibility of an infusion but right now he does not want to proceed with that. 07/13/18 on evaluation today patient appears to be doing about the same in regard to his left lateral lower surety ulcer. Fortunately there's no signs of infection at this time which is great news. Unfortunately he still builds up a significant amount of Slough/biofilm of the surface of the wound he still is not really cleaning this as he should be appropriately. Again I'm able to easily with saline and gauze remove the majority of this on the surface which if you would do this at home would likely be a dramatic improvement for him as far as getting the area to improve. Nonetheless overall I still feel like he is making progress is just very slow. I think Santyl will be of benefit for him as well. Still he has not gotten this as of this point. 07/27/18 on evaluation today patient actually appears to be doing little worse in regards of the erythema around the periwound region of the wound he also tells me that he's been having more drainage currently compared to what he was experiencing last time I saw him. He states not quite as bad as what he had because this was infected previously but nonetheless is still appears to be doing poorly. Fortunately there is no evidence of systemic infection at this point. The patient tells me that he is not going to be able to afford the Santyl. He is still waiting to hear about the infusion therapy with his dermatologist. Apparently she wants an updated colonoscopy first. DERAK, SCHURMAN ([id number]) 08/10/18 on evaluation today patient appears to be doing better in regard to his left lateral lower extremity ulcer. Fortunately he is showing signs of improvement in this regard he's actually been approved for Remicade  infusion's as well although this has not been scheduled as of yet. Fortunately there's no signs of active infection at this time in regard to the wound although he is having some issues with infection of the right lower extremity is been seen as dermatologist for this. Fortunately they are definitely still working with him trying to keep things under control. 09/07/18 on evaluation today patient is actually doing rather well in regard to his left lateral lower extremity ulcer. He notes these actually having some hair grow back on his extremity which is something he has not seen in years. He also tells me  that the pain is really not giving them any trouble at this time which is also good news overall she is very pleased with the progress he's using a combination of the mupirocin along with the probate is all mixed. 09/21/18 on evaluation today patient actually appears to be doing fairly well all things considered in regard to his looks from the ulcer. He's been tolerating the dressing changes without complication. Fortunately there's no signs of active infection at this time which is good news he is still on all antibiotics or prevention of the staff infection. He has been on prednisone for time although he states it is gonna contact his dermatologist and see if she put them on a short course due to some irritation that he has going on currently. Fortunately there's no evidence of any overall worsening this is going very slow I think cental would be something that would be helpful for him although he states that $50 for tube is quite expensive. He therefore is not willing to get that at this point. 10/06/18 on evaluation today patient actually appears to be doing decently well in regard to his left lateral leg ulcer. He's been tolerating the dressing changes without complication. Fortunately there's no signs of active infection at this time. Overall I'm actually rather pleased with the progress he's making  although it's slow he doesn't show any signs of infection and he does seem to be making some improvement. I do believe that he may need a switch up and dressings to try to help this to heal more appropriately and quickly. 10/19/18 on evaluation today patient actually appears to be doing better in regard to his left lateral lower extremity ulcer. This is shown signs of having much less Slough buildup at this point due to the fact he has been using the [employer]. Obviously this is very good news. The overall size of the wound is not dramatically smaller but again the appearance is. 11/02/18 on evaluation today patient actually appears to be doing quite well in regard to his lower [place] ulcer. A lot of the skin around the ulcer is actually somewhat irritating at this point this seems to be more due to the dressing causing irritation from the adhesive that anything else. Fortunately there is no signs of active infection at this time. 11/24/18 on evaluation today patient appears to be doing a little worse in regard to his overall appearance of his lower extremity ulcer. There's more erythema and warmth around the wound unfortunately. He is currently on doxycycline which he has been on for some time. With that being said I'm not sure that seems to be helping with what appears to possibly be an acute cellulitis with regard to his left lower extremity ulcer. No fevers, chills, nausea, or vomiting noted at this time. 12/08/18 on evaluation today patient's wounds actually appears to be doing significantly better compared to his last evaluation. He has been using Santyl along with alternating tripling about appointment as well as the steroid cream seems to be doing quite well and the wound is showing signs of improvement which is excellent news. Fortunately there's no evidence of infection and in fact his culture came back negative with only normal skin flora noted. 12/21/2018 upon evaluation today patient actually  appears to be doing excellent with regard to his ulcer. This is actually the best that I have seen it since have been helping to take care of him. It is both smaller as well as less slough noted on  the surface of the wound and seems to be showing signs of good improvement with new skin growing from the edges. He has been using just the triamcinolone he does wonder if he can get a refill of that ointment today. 01/04/2019 upon evaluation today patient actually appears to be doing well with regard to his left lateral lower extremity ulcer. With that being said it does not appear to be that he is doing quite as well as last time as far as progression is concerned. There does not appear to be any signs of infection or significant irritation which is good news. With that being said I do believe that he may benefit from switching to a collagen based dressing based on how clean The wound appears. 01/18/2019 on evaluation today patient actually appears to be doing well with regard to his wound on the left lower extremity. He is not made a lot of progress compared to where we were previous but nonetheless does seem to be doing okay at this time which is good news. There is no signs of active infection which is also good news. My only concern currently is I do wish we can get him into utilizing the collagen dressing his insurance would not pay for the supplies that we ordered although it appears that he may be able to order this through his supply company that he typically utilizes. This is [employer]. Nonetheless he did try to order it during the office visit today and it appears this did go through. We will see if he can get that it is a different brand but nonetheless he has collagen and I do think will be beneficial. 02/01/2019 on evaluation today patient actually appears to be doing a little worse today in regard to the overall size of his wounds. Fortunately there is no signs of active infection at this time.  That is visually. Nonetheless when this is happened before it was due to infection. For that reason were somewhat concerned about that this time as well. 02/08/2019 on evaluation today patient unfortunately appears to be doing slightly worse with regard to his wound upon evaluation today. Is measuring a little deeper and a little larger unfortunately. I am not really sure exactly what is causing this to enlarge he actually did see his dermatologist she is going to see about initiating Humira for him. Subsequently she also did do steroid injections into the wound itself in the periphery. Nonetheless still nonetheless he seems to be getting a little bit larger he is gone back to just using the steroid cream topically which I think is appropriate. I would say hold off on the collagen for the time being is definitely a good thing to do. Based on the culture results which we finally did get the final result back regarding it shows staph as the bacteria noted again that can be a normal skin bacteria based on the fact however he is having increased drainage and worsening of the wound measurement wise I would go ahead and place him on an antibiotic today I do believe for this. 02/15/2019 on evaluation today patient actually appears to be doing somewhat better in regard to his ulcer. There is no signs of worsening at this time I did review his culture results which showed evidence of Staphylococcus aureus but not MRSA. Again this could just be more related to the normal skin bacteria although he states the drainage has slowed down quite a bit he may have had a mild infection not  just colonization. And was much smaller and then since around10/04/2019 on evaluation today patient appears to be doing unfortunately worse as far as the size of the wound. I really feel like that this is steadily getting larger again it had been doing excellent right at the beginning of September we have seen a steady increase in the  area of the wound it is almost 2-1/2 times the size it was on September 1. Obviously this is a bad trend this is not wanting to see. For that reason we went back to using just the topical triamcinolone cream which does seem to help with inflammation. I checked him for bacteria by way of culture and nothing showed positive there. I am considering giving him a short course of a tapering steroid Aidden Markovic, Marlee ([id number]) today to see if that is can be beneficial for him. The patient is in agreement with giving that a try. 03/08/2019 on evaluation today patient appears to be doing very well in comparison to last evaluation with regard to his lower extremity ulcer. This is showing signs of less inflammation and actually measuring slightly smaller compared to last time every other week over the past month and a half he has been measuring larger larger larger. Nonetheless I do believe that the issue has been inflammation the prednisone does seem to have been beneficial for him which is good news. No fevers, chills, nausea, vomiting, or diarrhea. 03/22/2019 on evaluation today patient appears to be doing about the same with regard to his leg ulcer. He has been tolerating the dressing changes without complication. With that being said the wound seems to be mostly arrested at its current size but really is not making any progress except for when we prescribed the prednisone. He did show some signs of dropping as far as the overall size of the wound during that interval week. Nonetheless this is something he is not on long-term at this point and unfortunately I think he is getting need either this or else the Humira which his dermatologist has discussed try to get approval for. With that being said he will be seeing his dermatologist on the 11th of this month that is November. 04/19/2019 on evaluation today patient appears to be doing really about the same the wound is measuring slightly larger compared  to last time I saw him. He has not been into the office since November 2 due to the fact that he unfortunately had Covid as that his entire family. He tells me that it was rough but they did pull-through and he seems to be doing much better. Fortunately there is no signs of active infection at this time. No fevers, chills, nausea, vomiting, or diarrhea. 05/10/2019 on evaluation today patient unfortunately appears to be doing significantly worse as compared to last time I saw him. He does tell me that he has had his first dose of Humira and actually is scheduled to get the next one in the upcoming week. With that being said he tells me also that in the past several days he has been having a lot of issues with green drainage she showed me a picture this is more blue-green in color. He is also been having issues with increased sloughy buildup and the wound does appear to be larger today. Obviously this is not the direction that we want everything to take based on the starting of his Humira. Nonetheless I think this is definitely a result of likely infection and to be honest I  think this is probably Pseudomonas causing the infection based on what I am seeing. 05/24/2019 on evaluation today patient unfortunately appears to be doing significantly worse compared to his prior evaluation with me 2 weeks ago. I did review his culture results which showed that he does have Staph aureus as well as Pseudomonas noted on the culture. Nonetheless the Levaquin that I prescribed for him does not appear to have been appropriate and in fact he tells me he is no longer experiencing the green drainage and discharge that he had at the last visit. Fortunately there is no signs of active infection at this time which is good news although the wound has significantly worsened it in fact is much deeper than it was previous. We have been utilizing up to this point triamcinolone ointment as the prescription topical of choice but at  this time I really feel like that the wound is getting need to be packed in order to appropriately manage this due to the deeper nature of the wound. Therefore something along the lines of an alginate dressing may be more appropriate. 05/31/2019 upon inspection today patient's wound actually showed signs of doing poorly at this point. Unfortunately he just does not seem to be making any good progress despite what we have tried. He actually did go ahead and pick up the Cipro and start taking that as he was noticing more green drainage he had previously completed the Levaquin that I prescribed for him as well. Nonetheless he missed his appointment for the seventh last week on Wednesday with the wound care center and [facility] where his dermatologist referred him. Obviously I do think a second opinion would be helpful at this point especially in light of the fact that the patient seems to be doing so poorly despite the fact that we have tried everything that I really know how at this point. The only thing that ever seems to have helped him in the past is when he was on high doses of continual steroids that did seem to make a difference for him. 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 06/07/2019 upon evaluation today patient unfortunately appears to be doing worse yet again with regard to his wound. In fact I am starting to question whether or not he may have a fluid pocket in the muscle at this point based on the bulging and the soft appearance to the central portion of the muscle area. There is not anything draining from the muscle itself at this time which is good news but nonetheless the wound is expanding. I am not really seeing any results of the Humira as far as overall wound progression based on what I am seeing at this point. The  patient has been referred for second opinion with regard to his wound to the [facility] wound care center by his dermatologist which I definitely am not in opposition to. Unfortunately we tried multiple dressings in the past including collagen, alginate, and at one point even Hydrofera Blue. With that being said he is never really used it for any significant amount of time due to the fact that he often complains of pain associated with these dressings and then will go back to either using the Santyl which she has done intermittently or more frequently the triamcinolone. He is  also using his own compression stockings. We have wrapped him in the past but again that was something else that he really was not a big fan of. 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 He seemed to do much better when he was on the steroids although honestly I understand that the steroids are not necessarily the best medication to be on long-term obviously 06/14/2019 on evaluation today patient appears to be doing actually a little bit better with regard to the overall appearance with his leg. Unfortunately he does continue to have issues with what appears to be some fluid underneath the muscle although he did see the wound specialty center at [facility] last week their main goals were to see about infusion therapy in place of the Humira as they feel like that is not quite strong enough. They also recommended that we continue with the treatment otherwise as we are they felt like that was appropriate and they are okay with him continuing to follow-up here with [place] in that regard. With that being said they are also sending  him to the vein specialist there to see about vein stripping and if that would be of benefit for him. Subsequently they also did not really address whether or not an ultrasound of the muscle area to see if there is anything that needs to be addressed here would be appropriate or not. For that reason I discussed this with him last week I think we may proceed down that road at this point. 06/21/2019 upon evaluation today patient's wound actually appears to be doing slightly better compared to previous evaluations. I do believe that he has made a difference with regard to the progression here with the use of oral steroids. Again in the past has been the only thing that is really calm things down. He does tell me that from [facility] is gotten a good news from there that there are no further vein stripping that is necessary at this point. I do not have that available for review today although the patient did relay this to me. He also did obtain and have the ultrasound of the wound completed which I did sign off on today. It does appear that there is no fluid collection under the muscle this is likely then just edematous tissue in general. That is also good news. Overall I still believe the inflammation is the main issue here. He did inquire about the possibility of a wound VAC again with the muscle protruding like it is I am not really sure whether the wound VAC is necessarily ideal or not. That is something we will have to consider although I do believe he may need compression wrapping to try to help with edema control which could potentially be of benefit. 06/28/2019 on evaluation today patient appears to be doing slightly better measurement wise although this is not terribly smaller he least seems to be trending towards that direction. With that being said he still seems to have purulent drainage noted in the wound bed at this time. He has been on Levaquin followed by Cipro over the past month. Unfortunately he  still seems to have  some issues with active infection at this time. I did perform a culture last week in order to evaluate and see if indeed there was still anything going on. Subsequently the culture did come back Cabiness, Chelsey ([id number]) showing Pseudomonas which is consistent with the drainage has been having which is blue-green in color. He also has had an odor that again was somewhat consistent with Pseudomonas as well. Long story short it appears that the culture showed an intermediate finding with regard to how well the Cipro will work for the Pseudomonas infection. Subsequently being that he does not seem to be clearing up and at best what we are doing is just keeping this at [place] I think he may need to see infectious disease to discuss IV antibiotic options. 07/05/2019 upon evaluation today patient appears to be doing okay in regard to his leg ulcer. He has been tolerating the dressing changes at this point without complication. Fortunately there is no signs of active infection at this time which is good news. No fevers, chills, nausea, vomiting, or diarrhea. With that being said he does have an appointment with infectious disease tomorrow and his primary care on Wednesday. Again the reason for the infectious disease referral was due to the fact that he did not seem to be fully resolving with the use of oral antibiotics and therefore we were thinking that IV antibiotic therapy may be necessary secondary to the fact that there was an intermediate finding for how effective the Cipro may be. Nonetheless again he has been having a lot of purulent and even green drainage. Fortunately right now that seems to have calmed down over the past week with the reinitiation of the oral antibiotic. Nonetheless we will see what Dr. Megan Salon has to say. 07/12/2019 upon evaluation today patient appears to be doing about the same at this point in regard to his left lower extremity ulcer. Fortunately there is no  signs of active infection at this time which is good news I do believe the Levaquin has been beneficial I did review Dr. Hale Bogus note and to be honest I agree that the patient's leg does appear to be doing better currently. What we found in the past as he does not seem to really completely resolve he will stop the antibiotic and then subsequently things will revert back to having issues with blue-green drainage, increased pain, and overall worsening in general. Obviously that is the reason I sent him back to infectious disease. 07/19/2019 upon evaluation today patient appears to be doing roughly the same in size there is really no dramatic improvement. He has started back on the Levaquin at this point and though he seems to be doing okay he did still have a lot of blue/green drainage noted on evaluation today unfortunately. I think that this is still indicative more likely of a Pseudomonas infection as previously noted and again he does see Dr. Megan Salon in just a couple of days. I do not know that were really able to effectively clear this with just oral antibiotics alone based on what I am seeing currently. Nonetheless we are still continue to try to manage as best we can with regard to the patient and his wound. I do think the wrap was helpful in decreasing the edema which is excellent news. No fevers, chills, nausea, vomiting, or diarrhea. 07/26/2019 upon evaluation today patient appears to be doing slightly better with regard to the overall appearance of the muscle there is no dark discoloration centrally. Fortunately there  is no signs of active infection at this time. No fevers, chills, nausea, vomiting, or diarrhea. Patient's wound bed currently the patient did have an appointment with Dr. Megan Salon at infectious disease last week. With that being said Dr. Megan Salon the patient states was still somewhat hesitant about put him on any IV antibiotics he wanted [place] to repeat cultures today and then see  where things go going forward. He does look like Dr. Megan Salon because of some improvement the patient did have with the Levaquin wanted [place] to see about repeating cultures. If it indeed grows the Pseudomonas again then he recommended a possibility of considering a PICC line placement and IV antibiotic therapy. He plans to see the patient back in 1 to 2 weeks. 08/02/2019 upon evaluation today patient appears to be doing poorly with regard to his left lower extremity. We did get the results of his culture back it shows that he is still showing evidence of Pseudomonas which is consistent with the purulent/blue-green drainage that he has currently. Subsequently the culture also shows that he now is showing resistance to the oral fluoroquinolones which is unfortunate as that was really the only thing to treat the infection prior. I do believe that he is looking like this is going require IV antibiotic therapy to get this under control. Fortunately there is no signs of systemic infection at this time which is good news. The patient does see Dr. Megan Salon tomorrow. 08/09/2019 upon evaluation today patient appears to be doing better with regard to his left lower extremity ulcer in regard to the overall appearance. He is currently on IV antibiotic therapy. As ordered by Dr. Megan Salon. Currently the patient is on ceftazidime which she is going to take for the next 2 weeks and then follow-up for 4 to 5-week appointment with Dr. Megan Salon. The patient started this this past Friday symptoms have not for a total of 3 days currently in full. 08/16/2019 upon evaluation today patient's wound actually does show muscle in the base of the wound but in general does appear to be much better as far as the overall evidence of infection is concerned. In fact I feel like this is for the most part cleared up he still on the IV antibiotics he has not completed the full course yet but I think he is doing much better which is excellent  news. 08/23/2019 upon evaluation today patient appears to be doing about the same with regard to his wound at this point. He tells me that he still has pain unfortunately. Fortunately there is no evidence of systemic infection at this time which is great news. There is significant muscle protrusion. 09/13/19 upon evaluation today patient appears to be doing about the same in regard to his leg unfortunately. He still has a lot of drainage coming from the ulceration there is still muscle exposed. With that being said the patient's last wound culture still showed an intermediate finding with regard to the Pseudomonas he still having the bluish/green drainage as well. Overall I do not know that the wound has completely cleared of infection at this point. Fortunately there is no signs of active infection systemically at this point which is good news. 09/20/2019 upon evaluation today patient's wound actually appears to be doing about the same based on what I am seeing currently. I do not see any signs of systemic infection he still does have evidence of some local infection and drainage. He did see Dr. Megan Salon last week and Dr. Megan Salon states that  he probably does need a different IV antibiotic although he does not want to put him on this until the patient begins the Remicade infusion which is actually scheduled for about 10 days out from today on 13 May. Following that time Dr. Megan Salon is good to see him back and then will evaluate the feasibility of starting him on the IV antibiotic therapy once again at that point. I do not disagree with this plan I do believe as Dr. Megan Salon stated in his note that I reviewed today that the patient's issue is multifactorial with the pyoderma being 1 aspect of this that were hoping the Remicade will be helpful for her. In the meantime I think the gentamicin is, helping to keep things under decent okay control in regard to the ulcer. 09/27/2019 upon evaluation today patient  appears to be doing about the same with regard to his wound still there is a lot of muscle exposure though he does have some hyper granulation tissue noted around the edge and actually some granulation tissue starting to form over the muscle which is actually good news. Fortunately there is no evidence of active infection which is also good news. His pain is less at this point. 5/21; this is a patient I have not seen in a long time. 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 He has a large wound on the left lateral leg with protruding muscle. He comes in the clinic today showing the same area on his left medial ankle. He says there is been a spot there for some time although we have not previously defined this. Today he has a clearly defined area with slight amount of skin breakdown surrounded by raised areas with a purplish hue in color. This is not painful he says it is irritated. 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 He still has muscle protruding from the lateral portion of his left leg but fortunately the new area that they were concerned about at his last visit does not appear to have opened at this point. He is currently on Remicade infusions and seems to be doing better in my opinion in fact the wound itself seems to be overall much better. The purplish discoloration that he did have seems to have resolved and I think that is a good sign that hopefully the Remicade is doing its job. He does Laris, Tarquin ([id number]) have some biofilm noted over the surface of the wound. 11/01/2019 on evaluation today patient's wound actually appears to be doing excellent at this time. Fortunately there is no evidence of active infection and overall I feel like he is making great progress. The Remicade seems to be due excellent job in my opinion. 11/08/19  evaluation today vision actually appears to be doing quite well with regard to his weight ulcer. He's been tolerating dressing changes without complication. Fortunately there is no evidence of infection. No fevers, chills, nausea, or vomiting noted at this time. Overall states that is having more itching than pain which is actually a good sign in my opinion. 12/13/2019 upon evaluation today patient appears to be doing well today with regard to his wound. He has been tolerating the dressing changes without complication. Fortunately there is no sign of active infection at this time. No fevers, chills, nausea, vomiting, or diarrhea. Overall I feel like the infusion therapy has been very beneficial for him. 01/06/2020 on evaluation  today patient appears to be doing well with regard to his wound. This is measuring smaller and actually looks to be doing better. Fortunately there is no signs of active infection at this point. No fevers, chills, nausea, vomiting, or diarrhea. With that being said he does still have the blue-green drainage but this does not seem to be causing any significant issues currently. He has been using the gentamicin that does seem to be keeping things under decent control at this point. He goes later this morning for his next infusion therapy for the pyoderma which seems to also be very beneficial. 02/07/2020 on evaluation today patient appears to be doing about the same in regard to his wounds currently. Fortunately there is no signs of active infection systemically he does still have evidence of local infection still using gentamicin. He also is showing some signs of improvement albeit slowly I do feel like we are making some progress here. 02/21/2020 upon evaluation today patient appears to be making some signs of improvement the wound is measuring a little bit smaller which is great news and overall I am very pleased with where he stands currently. He is going to be having infusion  therapy treatment on the 15th of this month. Fortunately there is no signs of active infection at this time. 03/13/2020 I do believe patient's wound is actually showing some signs of improvement here which is great news. He has continue with the infusion therapy through rheumatology/dermatology at [facility]. That does seem to be beneficial. I still think he gets as much benefit from this as he did from the prednisone initially but nonetheless obviously this is less harsh on his body that the prednisone as far as they are concerned. 03/31/2020 on evaluation today patient's wound actually showing signs of some pretty good improvement in regard to the overall appearance of the wound bed. There is still muscle exposed though he does have some epithelial growth around the edges of the wound. Fortunately there is no signs of active infection at this time. No fevers, chills, nausea, vomiting, or diarrhea. 04/24/2020 upon evaluation today patient appears to be doing about the same in regard to his leg ulcer. He has been tolerating the dressing changes without complication. Fortunately there is no signs of active infection at this time. No fevers, chills, nausea, vomiting, or diarrhea. With that being said he still has a lot of irritation from the bandaging around the edges of the wound. We did discuss today the possibility of a referral to plastic surgery. 05/22/2020 on evaluation today patient appears to be doing well with regard to his wounds all things considered. He has not been able to get the Chantix apparently there is a recall nurse that I was unaware of put out by [employer] involuntarily. Nonetheless for now I am and I have to do some research into what may be the best option for him to help with quitting in regard to smoking and we discussed that today. 06/26/2020 upon evaluation today patient appears to be doing well with regard to his wound from the standpoint of infection I do not see any signs of infection  at this point. With that being said unfortunately he is still continuing to have issues with muscle exposure and again he is not having a whole lot of new skin growth unfortunately. There does not appear to be any signs of active infection at this time. No fevers, chills, nausea, vomiting, or diarrhea. 07/10/2020 upon evaluation today patient appears to be doing  a little bit more poorly currently compared to where he was previous. I am concerned currently about an active infection that may be getting worse especially in light of the increased size and tenderness of the wound bed. No fevers, chills, nausea, vomiting, or diarrhea. 07/24/2020 upon evaluation today patient appears to be doing poorly in regard to his leg ulcer. He has been tolerating the dressing changes without complication but unfortunately is having a lot of discomfort. 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 Subsequently I think he is going require possibly IV antibiotics to get this under control. I am very concerned about the severity of his infection and the amount of discomfort he is having. 07/31/2020 upon evaluation today patient appears to be doing about the same in regard to his leg wound. He did see Dr. Megan Salon and Dr. Megan Salon is actually going to start him on IV antibiotics. He goes for the PICC line tomorrow. With that being said there do not have that run for 2 weeks and then see how things are doing and depending on how he is progressing they may extend that a little longer. Nonetheless I am glad this is getting ready to be in place and definitely feel it may help the patient. In the meantime is been using mainly triamcinolone to the wound bed has an anti-inflammatory. 08/07/2020 on evaluation today patient appears to be doing well with regard to his wound compared even last week. In the interim he has gotten the PICC line placed and overall this seems  to be doing excellent. There does not appear to be any evidence of infection which is great news systemically although locally of course has had the infection this appears to be improving with the use of the antibiotics. 08/14/2020 upon evaluation today patient's wound actually showing signs of excellent improvement. Overall the irritation has significantly improved the drainage is back down to more of a normal level and his pain is really pretty much nonexistent compared to what it was. Obviously I think that this is significantly improved secondary to the IV antibiotic therapy which has made all the difference in the world. Again he had a resistant form of Pseudomonas for which oral antibiotics just was not cutting it. Nonetheless I do think that still we need to consider the possibility of a surgical closure for this wound is been open so long and to be honest with muscle exposed I think this can be very hard to get this to close outside of this although definitely were still working to try to do what we can in that regard. 08/21/2020 upon evaluation today patient appears to be doing very well with regard to his wounds on the left lateral lower extremity/calf area. Fortunately there does not appear to be signs of active infection which is great news and overall very pleased with where things stand today. He is actually wrapping up his treatment with IV antibiotics tomorrow. After that we will see where things go from there. 08/28/2020 upon evaluation today patient appears to be doing decently well with regard to his leg ulcer. There does not appear to be any signs of Petrow, Sheryl ([id number]) active infection which is great news and overall very pleased with where things stand today. No fevers, chills, nausea, vomiting, or diarrhea. 09/18/2020 upon evaluation today patient appears to be doing well with regard to his infection which I feel like is better. Unfortunately he is not doing as well with  regard  to the overall size of the wound which is not nearly as good at this point. I feel like that he may be having an issue here with the pyoderma being somewhat out of control. I think that he may benefit from potentially going back and talking to the dermatologist about what to do from the pyoderma standpoint. I am not certain if the infusions are helping nearly as much is what the prednisone did in the past. 10/02/2020 upon evaluation today patient appears to be doing well with regard to his leg ulcer. He did go to the [facility] nurse. Unfortunately they feel like there is a 10% chance that most that he would be able to heal and that the skin graft would take. Obviously this has led him to not be able to go down that path as far as treatment is concerned. Nonetheless he does seem to be doing a little bit better with the prednisone that I gave him last time. I think that he may need to discuss with dermatology the possibility of long-term prednisone as that seems to be what is most helpful for him to be perfectly honest. I am not sure the Remicade is really doing the job. 10/17/2020 upon evaluation today patient appears to be doing a little better in regard to his wound. In fact the case has been since we did the prednisone on May 2 for him that we have noticed a little bit of improvement each time we have seen a size wise as well as appearance wise as well as pain wise. I think the prednisone has had a greater effect then the infusion therapy has to be perfectly honest. With that being said the patient also feels significantly better compared to what he was previous. All of this is good news but nonetheless I am still concerned about the fact that again we are really not set up to long-term manage him as far as prednisone is concerned. Obviously there are things that you need to be watched I completely understand the risk of prednisone usage as well. That is why has been doing the infusion therapy to try  and control some of the pyoderma. With all that being said I do believe that we can give him another round of the prednisone which she is requesting today because of the improvement that he seen since we did that first round. 10/30/2020 upon evaluation today patient's wound actually is showing signs of doing quite well. There does not appear to be any evidence of infection which is great news and overall very pleased with where things stand today. No fevers, chills, nausea, vomiting, or diarrhea. He tells me that the prednisone still has seem to have helped he wonders if we can extend that for just a little bit longer. He did not have the appointment with a dermatologist although he did have an infusion appointment last Friday. That was at [facility]. With that being said he tells me he could not do both that as well as the appointment with the physician on the same day therefore that is can have to be rescheduled. I really want to see if there is anything they feel like that could be done differently to try to help this out as I am not really certain that the infusions are helping significantly here. 11/13/2020 upon evaluation today patient unfortunately appears to be doing somewhat poorly in regard to his wound I feel like this is actually worsening from the standpoint of the pyoderma  spreading. I still feel like that he may need something different as far as trying to manage this going forward. Again we did the prednisone unfortunately his blood sugars are not doing so well because of this. Nonetheless I believe that the patient likely needs to try topical steroid. We have done triamcinolone for a while I think going with something stronger such as clobetasol could be beneficial again this is not something I do lightly I discussed this with the patient that again this does not normally put underneath an occlusive dressing. Nonetheless I think a thin film as such could help with some of the stronger  anti-inflammatory effects. We discussed this today. He would like to try to give this a trial for the next couple weeks. I definitely think that is something that we can do. Evaluate7/03/2021 and today patient's wound bed actually showed signs of doing really about the same. There was a little expansion of the size of the wound and that leading edge that we done looking out although the clobetasol does seem to have slowed this down a bit in my opinion. There is just 1 small area that still seems to be progressing based on what I see. Nonetheless I am concerned about the fact this does not seem to be improving if anything seems to be doing a little bit worse. I do not know that the infusions are really helping him much as next infusion is August 5 his appointment with dermatology is July 25. Either way I really think that we need to have a conversation potentially about this and I am actually going to see if I can talk with Dr. Lillia Carmel in order to see where things stand as well. 12/11/2020 upon evaluation today patient appears to be doing worse in regard to his leg ulcer. Unfortunately I just do not think this is making the progress that I would like to see at this point. Honestly he does have an appointment with dermatology and this is in 2 days. I am wondering what they may have to offer to help with this. Right now what I am seeing is that he is continuing to show signs of worsening little by little. Obviously that is not great at all. Is the exact opposite of what we are looking for. 12/18/2020 upon evaluation today patient appears to be doing a little better in regard to his wound. The dermatologist actually did do some steroid injections into the wound which does seem to have been beneficial in my opinion. That was on the 25th already this looks a little better to me than last time I saw him. With that being said we did do a culture and this did show that he has Staph aureus noted in abundance in  the wound. With that being said I do think that getting him on an oral antibiotic would be appropriate as well. Also think we can compression wrap and this will make a difference as well. 12/28/2020 upon evaluation today patient's wound is actually showing signs of doing much better. I do believe the compression wrap is helping he has a lot of drainage but to be honest I think that the compression is helping to some degree in this regard as well as not draining through which is also good news. No fevers, chills, nausea, vomiting, or diarrhea. 01/04/2021 upon evaluation today patient appears to be doing well with regard to his wound. Overall things seem to be doing quite well. He did have a little bit  of reaction to the CarboFlex Sorbact he will be using that any longer. With that being said he is controlled as far as the drainage is concerned overall and seems to be doing quite well. I do not see any signs of active infection at this time which is great news. No fevers, chills, nausea, vomiting, or diarrhea. 01/11/2021 upon evaluation today patient appears to be doing well with regard to his wounds. He has been tolerating the dressing changes without complication. Fortunately there does not appear to be any signs of active infection at this time which is great news. Overall I am extremely pleased with where we stand currently. No fevers, chills, nausea, vomiting, or diarrhea. Where using clobetasol in the wound bed he has a lot of new skin growth which is awesome as well. 01/18/2021 upon evaluation today patient appears to be doing very well in regard to his leg ulcer. He has been tolerating the dressing changes without complication. Fortunately there does not appear to be any signs of active infection which is great news. In general I think that he is making excellent progress 01/25/2021 upon evaluation today patient appears to be doing well with regard to his wound on the leg. I am actually extremely  pleased with where things stand today. There does not appear to be any signs of active infection which is great news and overall I think that we are definitely headed in the appropriate direction based on what I am seeing currently. There does not appear to be any signs of active infection also excellent news. 02/06/2021 upon evaluation today patient appears to be doing well with regard to his wound. Overall visually this is showing signs of significant Farias, Daylan ([id number]) improvement which is great news. I do not see any signs of active infection systemically which is great even locally I do not think that we are seeing any major complications here. We did do fluorescence imaging with the MolecuLight DX today. The patient does have some odor and drainage noted and again this is something that I think would benefit him to probably come more frequently for nurse visits. Objective Constitutional Well-nourished and well-hydrated in no acute distress. Vitals Time Taken: 8:17 AM, Height: 71 in, Weight: 338 lbs, BMI: 47.1, Temperature: 98.4 F, Pulse: 83 bpm, Respiratory Rate: 18 breaths/min, Blood Pressure: 125/74 mmHg. Respiratory normal breathing without difficulty. Psychiatric this patient is able to make decisions and demonstrates good insight into disease process. Alert and Oriented x 3. pleasant and cooperative. General Notes: Upon inspection patient's wound bed actually showed signs of good granulation and epithelization at this point. I actually see a lot of improvement and the measurements are indeed smaller today even compared to last time I saw him 2 weeks ago. In fact every time I see him were seeing improvement in the past several weeks and really even month and a half or so. 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 Right now the patient is currently on the Bactrim DS that I really up for him for the last regimen last week. He has that for roughly another week at this point. Integumentary (Hair, Skin) Wound #1 status is Open. Original cause of wound was Gradually Appeared. The date acquired was: 11/18/2015. The wound  has been in treatment 217 weeks. The wound is located on the Left,Lateral Lower Leg. The wound measures 5cm length x 5.2cm width x 0.6cm depth; 20.42cm^2 area and 12.252cm^3 volume. There is muscle, tendon, and Fat Layer (Subcutaneous Tissue) exposed. There is no tunneling noted. There is a large amount of serosanguineous drainage noted. There is large (67-100%) red granulation within the wound bed. There is a small (1-33%) amount of necrotic tissue within the wound bed including Adherent Slough. 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 There was a Four Layer Compression Therapy Procedure with a pre-treatment ABI of 1.2 by Dolan Amen, RN. Post procedure Diagnosis Wound #1: Same as Pre-Procedure Plan DELAN, KSIAZEK ([id number]) Follow-up Appointments: Return Appointment in 1 week. Nurse Visit as needed - twice a week Bathing/ Shower/ Hygiene: Clean wound with Normal Saline or wound cleanser. Other: - Apply dakins soaked gauze on wound after cleansing wound IN OFFICE ONLY Edema Control - Lymphedema / Segmental Compressive Device / Other: Optional: One layer of unna paste to top of compression wrap (to act as an  anchor). Elevate, Exercise Daily and Avoid Standing for Long Periods of Time. Elevate legs to the level of the heart and pump ankles as often as possible Elevate leg(s) parallel to the floor when sitting. Medications-Please add to medication list.: P.O. Antibiotics - Finish antibiotics WOUND #1: - Lower Leg Wound Laterality: Left, Lateral Cleanser: Wound Cleanser 3 x Per Week/30 Days Discharge Instructions: Wash your hands with soap and water. Remove old dressing, discard into plastic bag and place into trash. Cleanse the wound with Wound Cleanser prior to applying a clean dressing using gauze sponges, not tissues or cotton balls. Do not scrub or use excessive force. Pat dry using gauze sponges, not tissue or cotton balls. Peri-Wound Care: Desitin Maximum Strength Ointment 4 (oz) 3 x Per Week/30 Days Discharge Instructions: Apply from 12 o'clock to 6 o'clock periwound Peri-Wound Care: Triamcinolone Acetonide Cream, 0.1%, 15 (g) tube 3 x Per Week/30 Days Discharge Instructions: Apply on posterior leg for itchiness and irritation Topical: Clobetasol Propionate ointment 0.05%, 60 (g) tube 3 x Per Week/30 Days Discharge Instructions: Apply in undermining area Primary Dressing: Calcium Alginate Dressing, 6x6 (in/in) 3 x Per Week/30 Days Discharge Instructions: Apply to wound bed Secondary Dressing: Drawtex Hydroconductive Wound Dressing, 4x4 (in/in) 3 x Per Week/30 Days Discharge Instructions: Apply over calcium alginate Secondary Dressing: Xtrasorb Large 6x9 (in/in) 3 x Per Week/30 Days Discharge Instructions: Cover entire wound Compression Wrap: Medichoice 4 layer Compression System, 35-40 mmHG (Generic) 3 x Per Week/30 Days Discharge Instructions: Apply multi-layer wrap as directed. Add-Ons: CarboFLEX Odor Control Dressing, 4x4 (in/in) 3 x Per Week/30 Days Discharge Instructions: PLEASE APPLY OVER THE XSORB DO NOT APPLY ON SKIN Place the fibrous (non-shiny) surface of the dressing directly  onto the wound for management of malodorous wounds. 1. Based on what I am seeing currently I would recommend that we actually go ahead and continue with the wound care measures as before specifically with regard to the compression wrapping I do feel like that the 4-layer compression wrap is very beneficial for him. 2. I am also can recommend that we continue with the plain alginate. Again  the patient has issues with silver dressings this causes him significant complication as far as skin irritation as well. 3. I am also can recommend that when he comes in for his visit is whether nurse visits or provider visits that we use the Dakin's moistened gauze over the surface of the wound is laying there for 5 to 10 minutes well other things are being done in order to kill any surface bacteria and hopefully prevent anything from worsening from an infection standpoint. We will see patient back for reevaluation in 1 week here in the clinic. If anything worsens or changes patient will contact our office for additional recommendations. MolecuLight DX: 1st Scanned Wound The following wound was scanned with MolecuLight DX): Left lateral leg ulcer Fluorescence bacterial imaging was medically necessary today due to Initial Evaluation of the wound with MolecuLightDX to determine (Indication): baseline bacterial bioburden level MolecuLight Results Red Colors, Blush Colors, Green Colors The indicated colors were noted in the following area(s). In the periphery of the wound As a result of todayos scan, the following treatment plans were put in Soaking with Dakin's moistened gauze for 5 to 10 minutes [address] visits [address]. MolecuLight Procedure The MolecularLight DX device was cleaned with a disinfectant wipe prior to use., The correct patient profile was confirmed and correct wound was verified., Range finder sensor used to ensure appropriate distance The following was completed: selected between imaging unit and  wound bed, Room lights were turned off and the ambient light sensor was checked., Blue circle appeared around the lightbulb., The fluorescence icon was selected. Screen was tapped to enhance focus and the image was captured. Additional drapes were used to ensure adequate darknesso No Potential ICD-10 Codes ICD-10 A49.9 Bacterial infection unspecified (Red/Blush/Yellow Color) Additional Scanned Wounds KULE, GASCOIGNE ([id number]) Did you scan any additional Woundso No Electronic Signature(s) Signed: 02/06/2021 9:56:47 AM By: Worthy Keeler PA-C Previous Signature: 02/06/2021 9:29:32 AM Version By: Worthy Keeler PA-C Entered By: Worthy Keeler on 02/06/2021 09:56:47 Biggs, [name] ([id number]) -------------------------------------------------------------------------------- SuperBill Details Patient Name: [name] Date of Service: 02/06/2021 Medical Record Number: [id number] Patient Account Number: [id number] Date of Birth/Sex: April 04, 1981 (40 y.o. M) Treating RN: Dolan Amen Primary Care Provider: [name] Other Clinician: Referring Provider: [name] Treating Provider/Extender: [name] Cliche in Treatment: 217 Diagnosis Coding ICD-10 Codes Code Description [phone] Non-pressure chronic ulcer of left calf with fat layer exposed L88 Pyoderma gangrenosum L97.321 Non-pressure chronic ulcer of left ankle limited to breakdown of skin I87.2 Venous insufficiency (chronic) (peripheral) L03.116 Cellulitis of left lower limb F17.208 Nicotine dependence, unspecified, with other nicotine-induced disorders Facility Procedures CPT4 Code: 66294765 Description: NONCNTACT RT FLORO WND 1ST STE (CDM [id number]) Modifier: Quantity: 1 CPT4 Code: Description: ICD-10 Diagnosis Description L97.222 Non-pressure chronic ulcer of left calf with fat layer exposed Modifier: Quantity: CPT4 Code: 56812751 Description: (Facility Use Only) 29581LT - APPLY MULTLAY COMPRS LWR LT  LEG Modifier: Quantity: 1 Physician Procedures CPT4 Code: 7001749 Description: 44967 - WC PHYS LEVEL 4 - EST PT Modifier: 25 Quantity: 1 CPT4 Code: Description: ICD-10 Diagnosis Description L97.222 Non-pressure chronic ulcer of left calf with fat layer exposed L88 Pyoderma gangrenosum L97.321 Non-pressure chronic ulcer of left ankle limited to breakdown of skin I87.2 Venous insufficiency (chronic)  (peripheral) Modifier: Quantity: CPT4 Code: 5916B Description: HC MOLECULIGHT WND IMG 1ST ANATOMIC SITE Modifier: Quantity: 1 CPT4 Code: Description: ICD-10 Diagnosis Description L97.222 Non-pressure chronic ulcer of left calf with fat layer exposed Modifier: Quantity: Electronic Signature(s) Signed: 02/06/2021 4:47:24 PM By: Laurin Coder ,  Minus Breeding RN Signed: 02/06/2021 6:22:10 PM By: Worthy Keeler PA-C Previous Signature: 02/06/2021 9:33:03 AM Version By: Worthy Keeler PA-C Entered By: Dolan Amen on 02/06/2021 09:47:30

## 2021-02-09 ENCOUNTER — Other Ambulatory Visit: Payer: Self-pay

## 2021-02-09 DIAGNOSIS — L080 Pyoderma: Secondary | ICD-10-CM | POA: Diagnosis not present

## 2021-02-09 NOTE — Progress Notes (Signed)
ERMINIO, NYGARD ([id number]) Visit Report for 02/09/2021 Arrival Information Details Patient Name: [name], [name] Date of Service: 02/09/2021 8:00 AM Medical Record Number: [id number] Patient Account Number: [id number] Date of Birth/Sex: 1981/04/28 (40 y.o. M) Treating RN: Donnamarie Poag Primary Care Roosevelt Eimers: [name] Other Clinician: Referring Kearstin Learn: [name] Treating Jared Cahn/Extender: [name] Cliche in Treatment: 218 Visit Information History Since Last Visit Added or deleted any medications: No Patient Arrived: Ambulatory Had a fall or experienced change in No Arrival Time: 08:07 activities of daily living that may affect Accompanied By: self risk of falls: Transfer Assistance: None Hospitalized since last visit: No Patient Identification Verified: Yes Has Dressing in Place as Prescribed: Yes Secondary Verification Process Completed: Yes Pain Present Now: No Patient Requires Transmission-Based No Precautions: Patient Has Alerts: Yes Patient Alerts: Patient has reaction to silver dressings. Electronic Signature(s) Signed: 02/09/2021 3:42:23 PM By: Donnamarie Poag Entered By: Donnamarie Poag on 02/09/2021 08:08:06 [name] ([id number]) -------------------------------------------------------------------------------- Clinic Level of Care Assessment Details Patient Name: [name] Date of Service: 02/09/2021 8:00 AM Medical Record Number: [id number] Patient Account Number: [id number] Date of Birth/Sex: 1981-04-16 (40 y.o. M) Treating RN: Donnamarie Poag Primary Care Aarin Sparkman: [name] Other Clinician: Referring Jaicob Dia: [name] Treating Idabell Picking/Extender: [name] Cliche in Treatment: 218 Clinic Level of Care Assessment Items TOOL 1 Quantity Score []  - Use when EandM and Procedure is performed on INITIAL visit 0 ASSESSMENTS - Nursing Assessment / Reassessment []  - General Physical Exam (combine w/ comprehensive assessment (listed just below)  when performed on new 0 pt. evals) []  - 0 Comprehensive Assessment (HX, ROS, Risk Assessments, Wounds Hx, etc.) ASSESSMENTS - Wound and Skin Assessment / Reassessment []  - Dermatologic / Skin Assessment (not related to wound area) 0 ASSESSMENTS - Ostomy and/or Continence Assessment and Care []  - Incontinence Assessment and Management 0 []  - 0 Ostomy Care Assessment and Management (repouching, etc.) PROCESS - Coordination of Care []  - Simple Patient / Family Education for ongoing care 0 []  - 0 Complex (extensive) Patient / Family Education for ongoing care []  - 0 Staff obtains Programmer, systems, Records, Test Results / Process Orders []  - 0 Staff telephones HHA, Nursing Homes / Clarify orders / etc []  - 0 Routine Transfer to another Facility (non-emergent condition) []  - 0 Routine Hospital Admission (non-emergent condition) []  - 0 New Admissions / Biomedical engineer / Ordering NPWT, Apligraf, etc. []  - 0 Emergency Hospital Admission (emergent condition) PROCESS - Special Needs []  - Pediatric / Minor Patient Management 0 []  - 0 Isolation Patient Management []  - 0 Hearing / Language / Visual special needs []  - 0 Assessment of Community assistance (transportation, D/C planning, etc.) []  - 0 Additional assistance / Altered mentation []  - 0 Support Surface(s) Assessment (bed, cushion, seat, etc.) INTERVENTIONS - Miscellaneous []  - External ear exam 0 []  - 0 Patient Transfer (multiple staff / Civil Service fast streamer / Similar devices) []  - 0 Simple Staple / Suture removal (25 or less) []  - 0 Complex Staple / Suture removal (26 or more) []  - 0 Hypo/Hyperglycemic Management (do not check if billed separately) []  - 0 Ankle / Brachial Index (ABI) - do not check if billed separately Has the patient been seen at the hospital within the last three years: Yes Total Score: 0 Level Of Care: ____ [name] ([id number]) Electronic Signature(s) Signed: 02/09/2021 3:42:23 PM By: Donnamarie Poag Entered By: Donnamarie Poag on 02/09/2021 08:09:30 [name] ([id number]) -------------------------------------------------------------------------------- Compression Therapy Details Patient Name: [name] Date of Service: 02/09/2021 8:00 AM Medical  Record Number: [id number] Patient Account Number: [id number] Date of Birth/Sex: 04-19-1981 (40 y.o. M) Treating RN: Donnamarie Poag Primary Care Rilda Bulls: [name] Other Clinician: Referring Jasmen Emrich: [name] Treating Demyah Smyre/Extender: [name] Cliche in Treatment: 218 Compression Therapy Performed for Wound Assessment: Wound #1 Left,Lateral Lower Leg Performed By: Junius Argyle, RN Compression Type: Four Layer Electronic Signature(s) Signed: 02/09/2021 3:42:23 PM By: Donnamarie Poag Entered By: Donnamarie Poag on 02/09/2021 08:08:48 [name] ([id number]) -------------------------------------------------------------------------------- Encounter Discharge Information Details Patient Name: [name] Date of Service: 02/09/2021 8:00 AM Medical Record Number: [id number] Patient Account Number: [id number] Date of Birth/Sex: [birth year] (40 y.o. M) Treating RN: Donnamarie Poag Primary Care Messi Twedt: [name] Other Clinician: Referring Shahil Speegle: [name] Treating Paraskevi Funez/Extender: [name] Cliche in Treatment: 218 Encounter Discharge Information Items Discharge Condition: Stable Ambulatory Status: Ambulatory Discharge Destination: Home Transportation: Private Auto Accompanied By: self Schedule Follow-up Appointment: Yes Clinical Summary of Care: Electronic Signature(s) Signed: 02/09/2021 3:42:23 PM By: Donnamarie Poag Entered By: Donnamarie Poag on 02/09/2021 98:33:82 [name] ([id number]) -------------------------------------------------------------------------------- Wound Assessment Details Patient Name: [name] Date of Service: 02/09/2021 8:00 AM Medical Record Number: [id number] Patient  Account Number: [id number] Date of Birth/Sex: 01-04-1981 (40 y.o. M) Treating RN: Donnamarie Poag Primary Care Jeanny Rymer: [name] Other Clinician: Referring Cherene Dobbins: [name] Treating Jahshua Bonito/Extender: [name] Cliche in Treatment: 218 Wound Status Wound Number: 1 Primary Etiology: Pyoderma Wound Location: Left, Lateral Lower Leg Wound Status: Open Wounding Event: Gradually Appeared Comorbid History: Sleep Apnea, Hypertension, Colitis Date Acquired: 11/18/2015 Weeks Of Treatment: 218 Clustered Wound: No Wound Measurements Length: (cm) 5 Width: (cm) 5.2 Depth: (cm) 0.6 Area: (cm) 20.42 Volume: (cm) 12.252 % Reduction in Area: -316% % Reduction in Volume: -212% Epithelialization: Small (1-33%) Wound Description Classification: Full Thickness With Exposed Support Structures Exudate Amount: Large Exudate Type: Serosanguineous Exudate Color: red, brown Foul Odor After Cleansing: No Slough/Fibrino Yes Wound Bed Granulation Amount: Large (67-100%) Exposed Structure Granulation Quality: Red Fascia Exposed: No Necrotic Amount: Small (1-33%) Fat Layer (Subcutaneous Tissue) Exposed: Yes Necrotic Quality: Adherent Slough Tendon Exposed: Yes Muscle Exposed: Yes Necrosis of Muscle: No Joint Exposed: No Bone Exposed: No Treatment Notes Wound #1 (Lower Leg) Wound Laterality: Left, Lateral Cleanser Wound Cleanser Discharge Instruction: Wash your hands with soap and water. Remove old dressing, discard into plastic bag and place into trash. Cleanse the wound with Wound Cleanser prior to applying a clean dressing using gauze sponges, not tissues or cotton balls. Do not scrub or use excessive force. Pat dry using gauze sponges, not tissue or cotton balls. Peri-Wound Care Desitin Maximum Strength Ointment 4 (oz) Discharge Instruction: Apply from 12 o'clock to 6 o'clock periwound Triamcinolone Acetonide Cream, 0.1%, 15 (g) tube Discharge Instruction: Apply on posterior  leg for itchiness and irritation Topical Clobetasol Propionate ointment 0.05%, 60 (g) tube Discharge Instruction: Apply to 7 o'clock depth and 1 o'clock depth Primary Dressing Calcium Alginate Dressing, 6x6 (in/in) Discharge Instruction: Apply to wound bed Ghuman, [name] ([id number]) Secondary Dressing Drawtex Hydroconductive Wound Dressing, 4x4 (in/in) Discharge Instruction: Apply over calcium alginate Xtrasorb Large 6x9 (in/in) Discharge Instruction: Cover entire wound Secured With Compression Wrap Medichoice 4 layer Compression System, 35-40 mmHG Discharge Instruction: Apply multi-layer wrap as directed. Compression Stockings Add-Ons Electronic Signature(s) Signed: 02/09/2021 3:42:23 PM By: Donnamarie Poag Entered ByDonnamarie Poag on 02/09/2021 24:26:83

## 2021-02-12 ENCOUNTER — Other Ambulatory Visit: Payer: Self-pay

## 2021-02-12 DIAGNOSIS — L080 Pyoderma: Secondary | ICD-10-CM | POA: Diagnosis not present

## 2021-02-12 NOTE — Progress Notes (Signed)
INIGO, LANTIGUA ([id number]) Visit Report for 02/12/2021 Arrival Information Details Patient Name: [name], STUDY Date of Service: 02/12/2021 8:00 AM Medical Record Number: [id number] Patient Account Number: [id number] Date of Birth/Sex: 08/21/1980 (40 y.o. M) Treating RN: Donnamarie Poag Primary Care Tyianna Menefee: [name] Other Clinician: Referring Junius Faucett: [name] Treating Goodwin Kamphaus/Extender: Tito Dine in Treatment: 218 Visit Information History Since Last Visit Added or deleted any medications: No Patient Arrived: Ambulatory Had a fall or experienced change in No Arrival Time: 08:06 activities of daily living that may affect Accompanied By: self risk of falls: Transfer Assistance: None Hospitalized since last visit: No Patient Identification Verified: Yes Has Dressing in Place as Prescribed: Yes Secondary Verification Process Completed: Yes Has Compression in Place as Prescribed: Yes Patient Requires Transmission-Based No Pain Present Now: No Precautions: Patient Has Alerts: Yes Patient Alerts: Patient has reaction to silver dressings. Electronic Signature(s) Signed: 02/12/2021 4:33:20 PM By: Donnamarie Poag Entered By: Donnamarie Poag on 02/12/2021 08:24:06 [name] ([id number]) -------------------------------------------------------------------------------- Clinic Level of Care Assessment Details Patient Name: [name] Date of Service: 02/12/2021 8:00 AM Medical Record Number: [id number] Patient Account Number: [id number] Date of Birth/Sex: 08-17-1980 (40 y.o. M) Treating RN: Donnamarie Poag Primary Care Marija Calamari: [name] Other Clinician: Referring Lashun Ramseyer: [name] Treating Maninder Deboer/Extender: Tito Dine in Treatment: 218 Clinic Level of Care Assessment Items TOOL 1 Quantity Score []  - Use when EandM and Procedure is performed on INITIAL visit 0 ASSESSMENTS - Nursing Assessment / Reassessment []  - General Physical Exam  (combine w/ comprehensive assessment (listed just below) when performed on new 0 pt. evals) []  - 0 Comprehensive Assessment (HX, ROS, Risk Assessments, Wounds Hx, etc.) ASSESSMENTS - Wound and Skin Assessment / Reassessment []  - Dermatologic / Skin Assessment (not related to wound area) 0 ASSESSMENTS - Ostomy and/or Continence Assessment and Care []  - Incontinence Assessment and Management 0 []  - 0 Ostomy Care Assessment and Management (repouching, etc.) PROCESS - Coordination of Care []  - Simple Patient / Family Education for ongoing care 0 []  - 0 Complex (extensive) Patient / Family Education for ongoing care []  - 0 Staff obtains Programmer, systems, Records, Test Results / Process Orders []  - 0 Staff telephones HHA, Nursing Homes / Clarify orders / etc []  - 0 Routine Transfer to another Facility (non-emergent condition) []  - 0 Routine Hospital Admission (non-emergent condition) []  - 0 New Admissions / Biomedical engineer / Ordering NPWT, Apligraf, etc. []  - 0 Emergency Hospital Admission (emergent condition) PROCESS - Special Needs []  - Pediatric / Minor Patient Management 0 []  - 0 Isolation Patient Management []  - 0 Hearing / Language / Visual special needs []  - 0 Assessment of Community assistance (transportation, D/C planning, etc.) []  - 0 Additional assistance / Altered mentation []  - 0 Support Surface(s) Assessment (bed, cushion, seat, etc.) INTERVENTIONS - Miscellaneous []  - External ear exam 0 []  - 0 Patient Transfer (multiple staff / Civil Service fast streamer / Similar devices) []  - 0 Simple Staple / Suture removal (25 or less) []  - 0 Complex Staple / Suture removal (26 or more) []  - 0 Hypo/Hyperglycemic Management (do not check if billed separately) []  - 0 Ankle / Brachial Index (ABI) - do not check if billed separately Has the patient been seen at the hospital within the last three years: Yes Total Score: 0 Level Of Care: ____ [name] ([id number]) Electronic  Signature(s) Signed: 02/12/2021 4:33:20 PM By: Donnamarie Poag Entered By: Donnamarie Poag on 02/12/2021 08:29:31 Moede, [name] ([id number]) -------------------------------------------------------------------------------- Compression Therapy Details Patient Name:  [name] Date of Service: 02/12/2021 8:00 AM Medical Record Number: [id number] Patient Account Number: [id number] Date of Birth/Sex: 1980/06/13 (40 y.o. M) Treating RN: Donnamarie Poag Primary Care Caylan Schifano: [name] Other Clinician: Referring Emmerich Cryer: [name] Treating Deshawnda Acrey/Extender: Tito Dine in Treatment: 218 Compression Therapy Performed for Wound Assessment: Wound #1 Left,Lateral Lower Leg Performed By: Junius Argyle, RN Compression Type: Four Layer Electronic Signature(s) Signed: 02/12/2021 4:33:20 PM By: Donnamarie Poag Entered By: Donnamarie Poag on 02/12/2021 08:27:53 [name] ([id number]) -------------------------------------------------------------------------------- Encounter Discharge Information Details Patient Name: [name] Date of Service: 02/12/2021 8:00 AM Medical Record Number: [id number] Patient Account Number: [id number] Date of Birth/Sex: June 05, 1980 (40 y.o. M) Treating RN: Donnamarie Poag Primary Care Anwen Cannedy: [name] Other Clinician: Referring Larsen Zettel: [name] Treating Elantra Caprara/Extender: Tito Dine in Treatment: 218 Encounter Discharge Information Items Discharge Condition: Stable Ambulatory Status: Ambulatory Discharge Destination: Home Transportation: Private Auto Accompanied By: self Schedule Follow-up Appointment: Yes Clinical Summary of Care: Electronic Signature(s) Signed: 02/12/2021 4:33:20 PM By: Donnamarie Poag Entered By: Donnamarie Poag on 02/12/2021 08:29:20 [name] ([id number]) -------------------------------------------------------------------------------- Wound Assessment Details Patient Name: [name] Date of  Service: 02/12/2021 8:00 AM Medical Record Number: [id number] Patient Account Number: [id number] Date of Birth/Sex: 1980/10/26 (40 y.o. M) Treating RN: Donnamarie Poag Primary Care Breylin Dom: [name] Other Clinician: Referring Kateryn Marasigan: [name] Treating Leiland Mihelich/Extender: Tito Dine in Treatment: 218 Wound Status Wound Number: 1 Primary Etiology: Pyoderma Wound Location: Left, Lateral Lower Leg Wound Status: Open Wounding Event: Gradually Appeared Comorbid History: Sleep Apnea, Hypertension, Colitis Date Acquired: 11/18/2015 Weeks Of Treatment: 218 Clustered Wound: No Wound Measurements Length: (cm) 5 Width: (cm) 5.2 Depth: (cm) 0.6 Area: (cm) 20.42 Volume: (cm) 12.252 % Reduction in Area: -316% % Reduction in Volume: -212% Epithelialization: Small (1-33%) Wound Description Classification: Full Thickness With Exposed Support Structures Exudate Amount: Large Exudate Type: Serosanguineous Exudate Color: red, brown Foul Odor After Cleansing: No Slough/Fibrino Yes Wound Bed Granulation Amount: Large (67-100%) Exposed Structure Granulation Quality: Red Fascia Exposed: No Necrotic Amount: Small (1-33%) Fat Layer (Subcutaneous Tissue) Exposed: Yes Necrotic Quality: Adherent Slough Tendon Exposed: Yes Muscle Exposed: Yes Necrosis of Muscle: No Joint Exposed: No Bone Exposed: No Treatment Notes Wound #1 (Lower Leg) Wound Laterality: Left, Lateral Cleanser Wound Cleanser Discharge Instruction: Wash your hands with soap and water. Remove old dressing, discard into plastic bag and place into trash. Cleanse the wound with Wound Cleanser prior to applying a clean dressing using gauze sponges, not tissues or cotton balls. Do not scrub or use excessive force. Pat dry using gauze sponges, not tissue or cotton balls. Peri-Wound Care Desitin Maximum Strength Ointment 4 (oz) Discharge Instruction: Apply from 12 o'clock to 6 o'clock periwound Triamcinolone  Acetonide Cream, 0.1%, 15 (g) tube Discharge Instruction: Apply on posterior leg for itchiness and irritation Topical Clobetasol Propionate ointment 0.05%, 60 (g) tube Discharge Instruction: Apply to 7 o'clock depth and 1 o'clock depth Primary Dressing Calcium Alginate Dressing, 6x6 (in/in) Discharge Instruction: Apply to wound bed Owczarzak, [name] ([id number]) Secondary Dressing Drawtex Hydroconductive Wound Dressing, 4x4 (in/in) Discharge Instruction: Apply over calcium alginate Xtrasorb Large 6x9 (in/in) Discharge Instruction: Cover entire wound Secured With Compression Wrap Medichoice 4 layer Compression System, 35-40 mmHG Discharge Instruction: Apply multi-layer wrap as directed. Compression Stockings Add-Ons Electronic Signature(s) Signed: 02/12/2021 4:33:20 PM By: Donnamarie Poag Entered ByDonnamarie Poag on 02/12/2021 46:80:32

## 2021-02-13 ENCOUNTER — Encounter: Payer: 59 | Admitting: Physician Assistant

## 2021-02-14 ENCOUNTER — Other Ambulatory Visit: Payer: Self-pay

## 2021-02-14 DIAGNOSIS — L080 Pyoderma: Secondary | ICD-10-CM | POA: Diagnosis not present

## 2021-02-14 NOTE — Progress Notes (Signed)
TRACE, [name] ([id number]) Visit Report for 02/14/2021 Arrival Information Details Patient Name: [name] Date of Service: 02/14/2021 8:00 AM Medical Record Number: [id number] Patient Account Number: [id number] Date of Birth/Sex: 04/09/1981 (40 y.o. M) Treating RN: Donnamarie Poag Primary Care Phillipa Morden: [name] Other Clinician: Referring Kaytlyn Din: [name] Treating Willian Donson/Extender: Yaakov Guthrie in Treatment: 219 Visit Information History Since Last Visit Added or deleted any medications: No Patient Arrived: Ambulatory Had a fall or experienced change in No Arrival Time: 08:04 activities of daily living that may affect Accompanied By: self risk of falls: Transfer Assistance: None Hospitalized since last visit: No Patient Identification Verified: Yes Has Dressing in Place as Prescribed: Yes Secondary Verification Process Completed: Yes Has Compression in Place as Prescribed: Yes Patient Requires Transmission-Based No Pain Present Now: No Precautions: Patient Has Alerts: Yes Patient Alerts: Patient has reaction to silver dressings. Electronic Signature(s) Signed: 02/14/2021 3:51:14 PM By: Donnamarie Poag Entered By: Donnamarie Poag on 02/14/2021 08:24:06 [name] ([id number]) -------------------------------------------------------------------------------- Clinic Level of Care Assessment Details Patient Name: [name] Date of Service: 02/14/2021 8:00 AM Medical Record Number: [id number] Patient Account Number: [id number] Date of Birth/Sex: 1980-08-21 (40 y.o. M) Treating RN: Donnamarie Poag Primary Care Starling Jessie: [name] Other Clinician: Referring Skila Rollins: [name] Treating Virgle Arth/Extender: Yaakov Guthrie in Treatment: 219 Clinic Level of Care Assessment Items TOOL 1 Quantity Score []  - Use when EandM and Procedure is performed on INITIAL visit 0 ASSESSMENTS - Nursing Assessment / Reassessment []  - General Physical Exam  (combine w/ comprehensive assessment (listed just below) when performed on new 0 pt. evals) []  - 0 Comprehensive Assessment (HX, ROS, Risk Assessments, Wounds Hx, etc.) ASSESSMENTS - Wound and Skin Assessment / Reassessment []  - Dermatologic / Skin Assessment (not related to wound area) 0 ASSESSMENTS - Ostomy and/or Continence Assessment and Care []  - Incontinence Assessment and Management 0 []  - 0 Ostomy Care Assessment and Management (repouching, etc.) PROCESS - Coordination of Care []  - Simple Patient / Family Education for ongoing care 0 []  - 0 Complex (extensive) Patient / Family Education for ongoing care []  - 0 Staff obtains Programmer, systems, Records, Test Results / Process Orders []  - 0 Staff telephones HHA, Nursing Homes / Clarify orders / etc []  - 0 Routine Transfer to another Facility (non-emergent condition) []  - 0 Routine Hospital Admission (non-emergent condition) []  - 0 New Admissions / Biomedical engineer / Ordering NPWT, Apligraf, etc. []  - 0 Emergency Hospital Admission (emergent condition) PROCESS - Special Needs []  - Pediatric / Minor Patient Management 0 []  - 0 Isolation Patient Management []  - 0 Hearing / Language / Visual special needs []  - 0 Assessment of Community assistance (transportation, D/C planning, etc.) []  - 0 Additional assistance / Altered mentation []  - 0 Support Surface(s) Assessment (bed, cushion, seat, etc.) INTERVENTIONS - Miscellaneous []  - External ear exam 0 []  - 0 Patient Transfer (multiple staff / Civil Service fast streamer / Similar devices) []  - 0 Simple Staple / Suture removal (25 or less) []  - 0 Complex Staple / Suture removal (26 or more) []  - 0 Hypo/Hyperglycemic Management (do not check if billed separately) []  - 0 Ankle / Brachial Index (ABI) - do not check if billed separately Has the patient been seen at the hospital within the last three years: Yes Total Score: 0 Level Of Care: ____ [name] ([id number]) Electronic  Signature(s) Signed: 02/14/2021 3:51:14 PM By: Donnamarie Poag Entered By: Donnamarie Poag on 02/14/2021 08:26:52 [name] ([id number]) -------------------------------------------------------------------------------- Compression Therapy Details Patient Name: [name]  Date of Service: 02/14/2021 8:00 AM Medical Record Number: [id number] Patient Account Number: [id number] Date of Birth/Sex: 1981-03-25 (40 y.o. M) Treating RN: Donnamarie Poag Primary Care Montee Tallman: [name] Other Clinician: Referring Aalaya Yadao: [name] Treating Falana Clagg/Extender: Yaakov Guthrie in Treatment: 219 Compression Therapy Performed for Wound Assessment: Wound #1 Left,Lateral Lower Leg Performed By: Junius Argyle, RN Compression Type: Four Layer Electronic Signature(s) Signed: 02/14/2021 3:51:14 PM By: Donnamarie Poag Entered By: Donnamarie Poag on 02/14/2021 08:25:29 [name] ([id number]) -------------------------------------------------------------------------------- Encounter Discharge Information Details Patient Name: [name] Date of Service: 02/14/2021 8:00 AM Medical Record Number: [id number] Patient Account Number: [id number] Date of Birth/Sex: [birth year] (40 y.o. M) Treating RN: Donnamarie Poag Primary Care Cheryl Stabenow: [name] Other Clinician: Referring Kristapher [place]: [name] Treating Stillman Buenger/Extender: Yaakov Guthrie in Treatment: 219 Encounter Discharge Information Items Discharge Condition: Stable Ambulatory Status: Ambulatory Discharge Destination: Home Transportation: Private Auto Accompanied By: self Schedule Follow-up Appointment: Yes Clinical Summary of Care: Electronic Signature(s) Signed: 02/14/2021 3:51:14 PM By: Donnamarie Poag Entered By: Donnamarie Poag on 02/14/2021 08:26:46 [name] ([id number]) -------------------------------------------------------------------------------- Wound Assessment Details Patient Name: [name] Date of  Service: 02/14/2021 8:00 AM Medical Record Number: [id number] Patient Account Number: [id number] Date of Birth/Sex: 02-21-1981 (40 y.o. M) Treating RN: Donnamarie Poag Primary Care Sohana Austell: [name] Other Clinician: Referring Talon Regala: [name] Treating Ajaya Crutchfield/Extender: Yaakov Guthrie in Treatment: 219 Wound Status Wound Number: 1 Primary Etiology: Pyoderma Wound Location: Left, Lateral Lower Leg Wound Status: Open Wounding Event: Gradually Appeared Comorbid History: Sleep Apnea, Hypertension, Colitis Date Acquired: 11/18/2015 Weeks Of Treatment: 219 Clustered Wound: No Wound Measurements Length: (cm) 5 Width: (cm) 5.2 Depth: (cm) 0.6 Area: (cm) 20.42 Volume: (cm) 12.252 % Reduction in Area: -316% % Reduction in Volume: -212% Epithelialization: Small (1-33%) Wound Description Classification: Full Thickness With Exposed Support Structures Exudate Amount: Large Exudate Type: Serosanguineous Exudate Color: red, brown Foul Odor After Cleansing: No Slough/Fibrino Yes Wound Bed Granulation Amount: Large (67-100%) Exposed Structure Granulation Quality: Red Fascia Exposed: No Necrotic Amount: Small (1-33%) Fat Layer (Subcutaneous Tissue) Exposed: Yes Necrotic Quality: Adherent Slough Tendon Exposed: Yes Muscle Exposed: Yes Necrosis of Muscle: No Joint Exposed: No Bone Exposed: No Treatment Notes Wound #1 (Lower Leg) Wound Laterality: Left, Lateral Cleanser Wound Cleanser Discharge Instruction: Wash your hands with soap and water. Remove old dressing, discard into plastic bag and place into trash. Cleanse the wound with Wound Cleanser prior to applying a clean dressing using gauze sponges, not tissues or cotton balls. Do not scrub or use excessive force. Pat dry using gauze sponges, not tissue or cotton balls. Peri-Wound Care Desitin Maximum Strength Ointment 4 (oz) Discharge Instruction: Apply from 12 o'clock to 6 o'clock periwound Triamcinolone  Acetonide Cream, 0.1%, 15 (g) tube Discharge Instruction: Apply on posterior leg for itchiness and irritation Topical Clobetasol Propionate ointment 0.05%, 60 (g) tube Discharge Instruction: Apply to 7 o'clock depth and 1 o'clock depth Primary Dressing Calcium Alginate Dressing, 6x6 (in/in) Discharge Instruction: Apply to wound bed Hahne, Herbie [name] ([id number]) Secondary Dressing Drawtex Hydroconductive Wound Dressing, 4x4 (in/in) Discharge Instruction: Apply over calcium alginate Xtrasorb Large 6x9 (in/in) Discharge Instruction: Cover entire wound Secured With Compression Wrap Medichoice 4 layer Compression System, 35-40 mmHG Discharge Instruction: Apply multi-layer wrap as directed. Compression Stockings Add-Ons Electronic Signature(s) Signed: 02/14/2021 3:51:14 PM By: Donnamarie Poag Entered ByDonnamarie Poag on 02/14/2021 08:24:43

## 2021-02-15 NOTE — Unmapped (Signed)
[facility]

## 2021-02-16 ENCOUNTER — Other Ambulatory Visit: Payer: Self-pay

## 2021-02-16 DIAGNOSIS — L080 Pyoderma: Secondary | ICD-10-CM | POA: Diagnosis not present

## 2021-02-16 NOTE — Progress Notes (Signed)
DEAVEN, URWIN ([id number]) Visit Report for 02/16/2021 Arrival Information Details Patient Name: [name], [name] Date of Service: 02/16/2021 8:00 AM Medical Record Number: [id number] Patient Account Number: [id number] Date of Birth/Sex: 1981/03/13 (40 y.o. M) Treating RN: Donnamarie Poag Primary Care [name] [name]: [name] Other Clinician: Referring Marijean Montanye: [name] Treating Kinzi Frediani/Extender: [name] Cliche in Treatment: 219 Visit Information History Since Last Visit Added or deleted any medications: No Patient Arrived: Ambulatory Had a fall or experienced change in No Arrival Time: 08:05 activities of daily living that may affect Accompanied By: self risk of falls: Transfer Assistance: None Hospitalized since last visit: No Patient Identification Verified: Yes Has Dressing in Place as Prescribed: Yes Secondary Verification Process Completed: Yes Has Compression in Place as Prescribed: Yes Patient Requires Transmission-Based No Pain Present Now: No Precautions: Patient Has Alerts: Yes Patient Alerts: Patient has reaction to silver dressings. Electronic Signature(s) Signed: 02/16/2021 12:48:12 PM By: Donnamarie Poag Entered By: Donnamarie Poag on 02/16/2021 08:30:57 [name] ([id number]) -------------------------------------------------------------------------------- Clinic Level of Care Assessment Details Patient Name: [name], [name] Date of Service: 02/16/2021 8:00 AM Medical Record Number: [id number] Patient Account Number: [id number] Date of Birth/Sex: April 30, 1981 (40 y.o. M) Treating RN: Donnamarie Poag Primary Care Jalayah Gutridge: [name] Other Clinician: Referring Vernia Teem: [name] Treating Taletha Twiford/Extender: [name] Cliche in Treatment: 219 Clinic Level of Care Assessment Items TOOL 1 Quantity Score []  - Use when EandM and Procedure is performed on INITIAL visit 0 ASSESSMENTS - Nursing Assessment / Reassessment []  - General Physical Exam (combine w/  comprehensive assessment (listed just below) when performed on new 0 pt. evals) []  - 0 Comprehensive Assessment (HX, ROS, Risk Assessments, Wounds Hx, etc.) ASSESSMENTS - Wound and Skin Assessment / Reassessment []  - Dermatologic / Skin Assessment (not related to wound area) 0 ASSESSMENTS - Ostomy and/or Continence Assessment and Care []  - Incontinence Assessment and Management 0 []  - 0 Ostomy Care Assessment and Management (repouching, etc.) PROCESS - Coordination of Care []  - Simple Patient / Family Education for ongoing care 0 []  - 0 Complex (extensive) Patient / Family Education for ongoing care []  - 0 Staff obtains Programmer, systems, Records, Test Results / Process Orders []  - 0 Staff telephones HHA, Nursing Homes / Clarify orders / etc []  - 0 Routine Transfer to another Facility (non-emergent condition) []  - 0 Routine Hospital Admission (non-emergent condition) []  - 0 New Admissions / Biomedical engineer / Ordering NPWT, Apligraf, etc. []  - 0 Emergency Hospital Admission (emergent condition) PROCESS - Special Needs []  - Pediatric / Minor Patient Management 0 []  - 0 Isolation Patient Management []  - 0 Hearing / Language / Visual special needs []  - 0 Assessment of Community assistance (transportation, D/C planning, etc.) []  - 0 Additional assistance / Altered mentation []  - 0 Support Surface(s) Assessment (bed, cushion, seat, etc.) INTERVENTIONS - Miscellaneous []  - External ear exam 0 []  - 0 Patient Transfer (multiple staff / Civil Service fast streamer / Similar devices) []  - 0 Simple Staple / Suture removal (25 or less) []  - 0 Complex Staple / Suture removal (26 or more) []  - 0 Hypo/Hyperglycemic Management (do not check if billed separately) []  - 0 Ankle / Brachial Index (ABI) - do not check if billed separately Has the patient been seen at the hospital within the last three years: Yes Total Score: 0 Level Of Care: ____ [name] ([id number]) Electronic  Signature(s) Signed: 02/16/2021 12:48:12 PM By: Donnamarie Poag Entered By: Donnamarie Poag on 02/16/2021 08:32:05 [name], [name] ([id number]) -------------------------------------------------------------------------------- Compression Therapy Details Patient Name: [name]  Date of Service: 02/16/2021 8:00 AM Medical Record Number: [id number] Patient Account Number: [id number] Date of Birth/Sex: [birth year] (40 y.o. M) Treating RN: Donnamarie Poag Primary Care Verdie Wilms: [name] Other Clinician: Referring Jazia Faraci: [name] Treating Ruthel Martine/Extender: [name] Cliche in Treatment: 219 Compression Therapy Performed for Wound Assessment: Wound #1 Left,Lateral Lower Leg Performed By: Junius Argyle, RN Compression Type: Four Layer Electronic Signature(s) Signed: 02/16/2021 12:48:12 PM By: Donnamarie Poag Entered By: Donnamarie Poag on 02/16/2021 08:31:29 [name] ([id number]) -------------------------------------------------------------------------------- Encounter Discharge Information Details Patient Name: [name] Date of Service: 02/16/2021 8:00 AM Medical Record Number: [id number] Patient Account Number: [id number] Date of Birth/Sex: February 17, 1981 (40 y.o. M) Treating RN: Donnamarie Poag Primary Care Yaris Ferrell: [name] Other Clinician: Referring Gibson Telleria: [name] Treating Avan Gullett/Extender: [name] Cliche in Treatment: 219 Encounter Discharge Information Items Discharge Condition: Stable Ambulatory Status: Ambulatory Discharge Destination: Home Transportation: Private Auto Accompanied By: self Schedule Follow-up Appointment: Yes Clinical Summary of Care: Electronic Signature(s) Signed: 02/16/2021 12:48:12 PM By: Donnamarie Poag Entered By: Donnamarie Poag on 02/16/2021 08:31:57 [name], [name] ([id number]) -------------------------------------------------------------------------------- Wound Assessment Details Patient Name: [name] Date of Service:  02/16/2021 8:00 AM Medical Record Number: [id number] Patient Account Number: [id number] Date of Birth/Sex: Dec 01, 1980 (40 y.o. M) Treating RN: Donnamarie Poag Primary Care Shemicka Cohrs: [name] Other Clinician: Referring Keiyana Stehr: [name] Treating Danean Marner/Extender: [name] Cliche in Treatment: 219 Wound Status Wound Number: 1 Primary Etiology: Pyoderma Wound Location: Left, Lateral Lower Leg Wound Status: Open Wounding Event: Gradually Appeared Comorbid History: Sleep Apnea, Hypertension, Colitis Date Acquired: 11/18/2015 Weeks Of Treatment: 219 Clustered Wound: No Wound Measurements Length: (cm) 5 Width: (cm) 5.2 Depth: (cm) 0.6 Area: (cm) 20.42 Volume: (cm) 12.252 % Reduction in Area: -316% % Reduction in Volume: -212% Epithelialization: Small (1-33%) Wound Description Classification: Full Thickness With Exposed Support Structures Exudate Amount: Large Exudate Type: Serosanguineous Exudate Color: red, brown Foul Odor After Cleansing: No Slough/Fibrino Yes Wound Bed Granulation Amount: Large (67-100%) Exposed Structure Granulation Quality: Red Fascia Exposed: No Necrotic Amount: Small (1-33%) Fat Layer (Subcutaneous Tissue) Exposed: Yes Necrotic Quality: Adherent Slough Tendon Exposed: Yes Muscle Exposed: Yes Necrosis of Muscle: No Joint Exposed: No Bone Exposed: No Treatment Notes Wound #1 (Lower Leg) Wound Laterality: Left, Lateral Cleanser Wound Cleanser Discharge Instruction: Wash your hands with soap and water. Remove old dressing, discard into plastic bag and place into trash. Cleanse the wound with Wound Cleanser prior to applying a clean dressing using gauze sponges, not tissues or cotton balls. Do not scrub or use excessive force. Pat dry using gauze sponges, not tissue or cotton balls. Peri-Wound Care Desitin Maximum Strength Ointment 4 (oz) Discharge Instruction: Apply from 12 o'clock to 6 o'clock periwound Triamcinolone Acetonide Cream,  0.1%, 15 (g) tube Discharge Instruction: Apply on posterior leg for itchiness and irritation Topical Clobetasol Propionate ointment 0.05%, 60 (g) tube Discharge Instruction: Apply to 7 o'clock depth and 1 o'clock depth Primary Dressing Calcium Alginate Dressing, 6x6 (in/in) Discharge Instruction: Apply to wound bed [name], [name] ([id number]) Secondary Dressing Drawtex Hydroconductive Wound Dressing, 4x4 (in/in) Discharge Instruction: Apply over calcium alginate Xtrasorb Large 6x9 (in/in) Discharge Instruction: Cover entire wound Secured With Compression Wrap Medichoice 4 layer Compression System, 35-40 mmHG Discharge Instruction: Apply multi-layer wrap as directed. Compression Stockings Add-Ons Electronic Signature(s) Signed: 02/16/2021 12:48:12 PM By: Donnamarie Poag Entered ByDonnamarie Poag on 02/16/2021 08:31:14

## 2021-02-19 ENCOUNTER — Encounter: Payer: 59 | Attending: Physician Assistant | Admitting: Physician Assistant

## 2021-02-19 ENCOUNTER — Other Ambulatory Visit: Payer: Self-pay

## 2021-02-19 DIAGNOSIS — I872 Venous insufficiency (chronic) (peripheral): Secondary | ICD-10-CM | POA: Insufficient documentation

## 2021-02-19 DIAGNOSIS — L97222 Non-pressure chronic ulcer of left calf with fat layer exposed: Secondary | ICD-10-CM | POA: Diagnosis not present

## 2021-02-19 DIAGNOSIS — G4730 Sleep apnea, unspecified: Secondary | ICD-10-CM | POA: Diagnosis not present

## 2021-02-19 DIAGNOSIS — L03116 Cellulitis of left lower limb: Secondary | ICD-10-CM | POA: Insufficient documentation

## 2021-02-19 DIAGNOSIS — F17208 Nicotine dependence, unspecified, with other nicotine-induced disorders: Secondary | ICD-10-CM | POA: Insufficient documentation

## 2021-02-19 DIAGNOSIS — I10 Essential (primary) hypertension: Secondary | ICD-10-CM | POA: Insufficient documentation

## 2021-02-19 DIAGNOSIS — L97321 Non-pressure chronic ulcer of left ankle limited to breakdown of skin: Secondary | ICD-10-CM | POA: Diagnosis not present

## 2021-02-19 DIAGNOSIS — L88 Pyoderma gangrenosum: Secondary | ICD-10-CM | POA: Insufficient documentation

## 2021-02-19 DIAGNOSIS — L080 Pyoderma: Secondary | ICD-10-CM | POA: Diagnosis present

## 2021-02-19 NOTE — Progress Notes (Signed)
[name], [name] ([id number]) Visit Report for 02/19/2021 Arrival Information Details Patient Name: [name], [name] Date of Service: 02/19/2021 8:15 AM Medical Record Number: [id number] Patient Account Number: [id number] Date of Birth/Sex: 12-Apr-1981 (40 y.o. M) Treating RN: [name] Primary Care [name] Other Clinician: Referring [name] Treating [name] in [name]: 219 Visit Information History Since Last Visit Pain Present Now: No Patient Arrived: Ambulatory Arrival Time: 08:21 Accompanied By: self Transfer Assistance: None Patient Identification Verified: Yes Secondary Verification Process Completed: Yes Patient Requires Transmission-Based No Precautions: Patient Has Alerts: Yes Patient Alerts: Patient has reaction to silver dressings. Electronic Signature(s) Signed: 02/19/2021 4:40:19 PM By: [name] Amen RN Entered By: [name] on 02/19/2021 08:21:58 [name] ([id number]) -------------------------------------------------------------------------------- Clinic Level of Care Assessment Details Patient Name: [name] Date of Service: 02/19/2021 8:15 AM Medical Record Number: [id number] Patient Account Number: [id number] Date of Birth/Sex: 15-Oct-1980 (40 y.o. M) Treating RN: [name] Primary Care [name] Other Clinician: Referring [name] Treating [name] in [name]: 219 Clinic Level of Care Assessment Items TOOL 1 Quantity Score []  - Use when EandM and Procedure is performed on INITIAL visit 0 ASSESSMENTS - Nursing Assessment / Reassessment []  - General Physical Exam (combine w/ comprehensive assessment (listed just below) when performed on new 0 pt. evals) []  - 0 Comprehensive Assessment (HX, ROS, Risk Assessments, Wounds Hx, etc.) ASSESSMENTS - Wound and Skin Assessment / Reassessment []  - Dermatologic / Skin  Assessment (not related to wound area) 0 ASSESSMENTS - Ostomy and/or Continence Assessment and Care []  - Incontinence Assessment and Management 0 []  - 0 Ostomy Care Assessment and Management (repouching, etc.) PROCESS - Coordination of Care []  - Simple Patient / Family Education for ongoing care 0 []  - 0 Complex (extensive) Patient / Family Education for ongoing care []  - 0 Staff obtains Programmer, systems, Records, Test Results / Process Orders []  - 0 Staff telephones HHA, Nursing Homes / Clarify orders / etc []  - 0 Routine Transfer to another Facility (non-emergent condition) []  - 0 Routine Hospital Admission (non-emergent condition) []  - 0 New Admissions / Biomedical engineer / Ordering NPWT, Apligraf, etc. []  - 0 Emergency Hospital Admission (emergent condition) PROCESS - Special Needs []  - Pediatric / Minor Patient Management 0 []  - 0 Isolation Patient Management []  - 0 Hearing / Language / Visual special needs []  - 0 Assessment of Community assistance (transportation, D/C planning, etc.) []  - 0 Additional assistance / Altered mentation []  - 0 Support Surface(s) Assessment (bed, cushion, seat, etc.) INTERVENTIONS - Miscellaneous []  - External ear exam 0 []  - 0 Patient Transfer (multiple staff / Civil Service fast streamer / Similar devices) []  - 0 Simple Staple / Suture removal (25 or less) []  - 0 Complex Staple / Suture removal (26 or more) []  - 0 Hypo/Hyperglycemic Management (do not check if billed separately) []  - 0 Ankle / Brachial Index (ABI) - do not check if billed separately Has the patient been seen at the hospital within the last three years: Yes Total Score: 0 Level Of Care: ____ [name] ([id number]) Electronic Signature(s) Signed: 02/19/2021 4:40:19 PM By: [name] Amen RN Entered By: [name] on 02/19/2021 09:12:56 [name] ([id number]) -------------------------------------------------------------------------------- Compression Therapy  Details Patient Name: [name] Date of Service: 02/19/2021 8:15 AM Medical Record Number: [id number] Patient Account Number: [id number] Date of Birth/Sex: 1981/01/17 (40 y.o. M) Treating RN: [name] Primary Care [name] Other Clinician: Referring [name] Treating [name]  in [name]: 219 Compression Therapy Performed for Wound Assessment: Wound #1 Left,Lateral Lower Leg Performed By: [name] Daniels, RN Compression Type: Four Layer Pre [name] ABI: 1.2 Post Procedure Diagnosis Same as Pre-procedure Electronic Signature(s) Signed: 02/19/2021 4:40:19 PM By: [name] Amen RN Entered By: [name] on 02/19/2021 08:57:50 [name] ([id number]) -------------------------------------------------------------------------------- Encounter Discharge Information Details Patient Name: [name] Date of Service: 02/19/2021 8:15 AM Medical Record Number: [id number] Patient Account Number: [id number] Date of Birth/Sex: 10/04/1980 (40 y.o. M) Treating RN: [name] Primary Care Gemini Bunte: [name] Other Clinician: Referring [name] Treating [name] in [name]: 219 Encounter Discharge Information Items Discharge Condition: Stable Ambulatory Status: Ambulatory Discharge Destination: Home Transportation: Private Auto Accompanied By: self Schedule Follow-up Appointment: Yes Clinical Summary of Care: Electronic Signature(s) Signed: 02/19/2021 4:40:19 PM By: [name] Amen RN Entered By: [name] on 02/19/2021 [name] ([id number]) -------------------------------------------------------------------------------- Lower Extremity Assessment Details Patient Name: [name] Date of Service: 02/19/2021 8:15 AM Medical Record Number: [id number] Patient Account Number: [id number] Date of Birth/Sex: February 06, 1981 (40 y.o.  M) Treating RN: [name] Primary Care [name] Other Clinician: Referring [name] Treating [name] Weeks in [name]: 219 Edema Assessment Assessed: [Left: Yes] [Right: No] Edema: [Left: Ye] [Right: s] Calf Left: Right: Point of Measurement: 33 cm From Medial Instep 47.5 cm Ankle Left: Right: Point of Measurement: 12 cm From Medial Instep 28.5 cm Vascular Assessment Pulses: Dorsalis Pedis Palpable: [Left:Yes] Electronic Signature(s) Signed: 02/19/2021 4:40:19 PM By: [name] Amen RN Entered By: [name] on 02/19/2021 08:39:03 Kinker, [name] ([id number]) -------------------------------------------------------------------------------- Multi Wound Chart Details Patient Name: [name] Date of Service: 02/19/2021 8:15 AM Medical Record Number: [id number] Patient Account Number: [id number] Date of Birth/Sex: 09/28/1980 (40 y.o. M) Treating RN: [name] Primary Care Aidenn Skellenger: [name] Other Clinician: Referring Meral Geissinger: [name] Treating Lalla Laham/Extender: [name] in [name]: 219 Vital Signs Height(in): 71 Pulse(bpm): 94 Weight(lbs): 338 Blood Pressure(mmHg): 136/82 Body Mass Index(BMI): 47 Temperature(F): 98.0 Respiratory Rate(breaths/min): 18 Photos: [N/A:N/A] Wound Location: Left, Lateral Lower Leg N/A N/A Wounding Event: Gradually Appeared N/A N/A Primary Etiology: Pyoderma N/A N/A Comorbid History: Sleep Apnea, Hypertension, Colitis N/A N/A Date Acquired: 11/18/2015 N/A N/A Weeks of [name]: 219 N/A N/A Wound Status: Open N/A N/A Measurements L x W x D (cm) 7x5x0.3 N/A N/A Area (cm) : 27.489 N/A N/A Volume (cm) : 8.247 N/A N/A % Reduction in Area: -460.00% N/A N/A % Reduction in Volume: -110.00% N/A N/A Classification: Full Thickness With Exposed N/A N/A Support Structures Exudate Amount: Large N/A N/A Exudate Type: Serosanguineous N/A N/A Exudate  Color: red, brown N/A N/A Granulation Amount: Large (67-100%) N/A N/A Granulation Quality: Red, Friable N/A N/A Necrotic Amount: Small (1-33%) N/A N/A Exposed Structures: Fat Layer (Subcutaneous Tissue): N/A N/A Yes Tendon: Yes Muscle: Yes Fascia: No Joint: No Bone: No Epithelialization: Medium (34-66%) N/A N/A [name] Notes Electronic Signature(s) Signed: 02/19/2021 4:40:19 PM By: [name] Amen RN Entered By: [name] on 02/19/2021 08:57:26 Sickman, [name] ([id number]) -------------------------------------------------------------------------------- Multi-Disciplinary Care Plan Details Patient Name: [name] Date of Service: 02/19/2021 8:15 AM Medical Record Number: [id number] Patient Account Number: [id number] Date of Birth/Sex: [birth year] (40 y.o. M) Treating RN: [name] Primary Care Belenda Alviar: [name] Other Clinician: Referring Lashan Gluth: [name] Treating Elliyah Liszewski/Extender: [name] in [name]: Elliott reviewed with physician Active Inactive Electronic Signature(s) Signed: 02/19/2021 4:40:19 PM By: [name] Amen RN Entered By: [name] on 02/19/2021 08:57:15 Mcglynn, [name] ([id number]) --------------------------------------------------------------------------------  Pain Assessment Details Patient Name: DANZIG, MACGREGOR Date of Service: 02/19/2021 8:15 AM Medical Record Number: [id number] Patient Account Number: [id number] Date of Birth/Sex: [birth year] (40 y.o. M) Treating RN: [name] Primary Care Ger Ringenberg: [name] Other Clinician: Referring Joss Friedel: [name] Treating Neala Miggins/Extender: [name] in [name]: 219 Active Problems Location of Pain Severity and Description of Pain Patient Has Paino No Site Locations Pain Management and Medication Current Pain Management: Electronic Signature(s) Signed: 02/19/2021 4:40:19 PM By: [name] Amen RN Entered By: [name] on 02/19/2021 08:30:53 [name] ([id number]) -------------------------------------------------------------------------------- Patient/Caregiver Education Details Patient Name: [name] Date of Service: 02/19/2021 8:15 AM Medical Record Number: [id number] Patient Account Number: [id number] Date of Birth/Gender: [birth year] (40 y.o. M) Treating RN: [name] Primary Care Physician: [name] Other Clinician: Referring Physician: Alma [name] Treating Physician/Extender: [name] in [name]: 219 Education Assessment Education Provided To: Patient Education Topics Provided Wound/Skin Impairment: Methods: Explain/Verbal Responses: State content correctly Electronic Signature(s) Signed: 02/19/2021 4:40:19 PM By: [name] Amen RN Entered By: [name] on 02/19/2021 09:13:24 [name] ([id number]) -------------------------------------------------------------------------------- Wound Assessment Details Patient Name: [name] Date of Service: 02/19/2021 8:15 AM Medical Record Number: [id number] Patient Account Number: [id number] Date of Birth/Sex: October 21, 1980 (40 y.o. M) Treating RN: [name] Primary Care Blossom Crume: [name] Other Clinician: Referring Verdie Barrows: [name] Treating Jeanae Whitmill/Extender: [name] in [name]: 219 Wound Status Wound Number: 1 Primary Etiology: Pyoderma Wound Location: Left, Lateral Lower Leg Wound Status: Open Wounding Event: Gradually Appeared Comorbid History: Sleep Apnea, Hypertension, Colitis Date Acquired: 11/18/2015 Weeks Of [name]: 219 Clustered Wound: No Photos Wound Measurements Length: (cm) 7 Width: (cm) 5 Depth: (cm) 0.3 Area: (cm) 27.489 Volume: (cm) 8.247 % Reduction in Area: -460% % Reduction in Volume: -110% Epithelialization: Medium (34-66%) Tunneling: No Undermining: No Wound Description Classification: Full Thickness With Exposed Support  Structures Exudate Amount: Large Exudate Type: Serosanguineous Exudate Color: red, brown Foul Odor After Cleansing: No Slough/Fibrino Yes Wound Bed Granulation Amount: Large (67-100%) Exposed Structure Granulation Quality: Red, Friable Fascia Exposed: No Necrotic Amount: Small (1-33%) Fat Layer (Subcutaneous Tissue) Exposed: Yes Necrotic Quality: Adherent Slough Tendon Exposed: Yes Muscle Exposed: Yes Necrosis of Muscle: No Joint Exposed: No Bone Exposed: No [name] Notes Wound #1 (Lower Leg) Wound Laterality: Left, Lateral Cleanser Wound Cleanser Discharge Instruction: Wash your hands with soap and water. Remove old dressing, discard into plastic bag and place into trash. Cleanse the wound with Wound Cleanser prior to applying a clean dressing using gauze sponges, not tissues or cotton balls. Do not scrub or use excessive force. Pat dry using gauze sponges, not tissue or cotton balls. CASIMIR, BARCELLOS ([id number]) Peri-Wound Care Desitin Maximum Strength Ointment 4 (oz) Discharge Instruction: Apply from 12 o'clock to 6 o'clock periwound Triamcinolone Acetonide Cream, 0.1%, 15 (g) tube Discharge Instruction: Apply on posterior leg for itchiness and irritation Topical Clobetasol Propionate ointment 0.05%, 60 (g) tube Discharge Instruction: Apply to 7 o'clock depth and 1 o'clock depth Primary Dressing Hydrofera Blue Ready Transfer Foam, 4x5 (in/in) Discharge Instruction: Apply Hydrofera Blue Ready to wound bed as directed Secondary Dressing Xtrasorb Large 6x9 (in/in) Discharge Instruction: Cover entire wound dressing Secured With Compression Wrap Medichoice 4 layer Compression System, 35-40 mmHG Discharge Instruction: Apply multi-layer wrap as directed. Compression Stockings Add-Ons Electronic Signature(s) Signed: 02/19/2021 4:40:19 PM By: [name] Amen RN Entered By: [name] on 02/19/2021 08:34:14 GAVINO, FOUCH  ([id number]) -------------------------------------------------------------------------------- Vitals Details Patient Name: [name] Date of Service: 02/19/2021 8:15 AM Medical Record Number: [id number] Patient Account  Number: [id number] Date of Birth/Sex: Sep 21, 1980 (40 y.o. M) Treating RN: [name] Primary Care Dagen Beevers: [name] Other Clinician: Referring Pakou Rainbow: [name] Treating Hosteen Kienast/Extender: [name] in [name]: 219 Vital Signs Time Taken: 08:22 Temperature (F): 98.0 Height (in): 71 Pulse (bpm): 94 Weight (lbs): 338 Respiratory Rate (breaths/min): 18 Body Mass Index (BMI): 47.1 Blood Pressure (mmHg): 136/82 Reference Range: 80 - 120 mg / dl Electronic Signature(s) Signed: 02/19/2021 4:40:19 PM By: [name] Amen RN Entered By: [name] on 02/19/2021 37:04:88

## 2021-02-19 NOTE — Progress Notes (Signed)
[name], [name] ([id number]) Visit Report for 02/19/2021 Chief Complaint Document Details Patient Name: [name], [name] Date of Service: 02/19/2021 8:15 AM Medical Record Number: [id number] Patient Account Number: [id number] Date of Birth/Sex: 1980/07/20 (40 y.o. 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 M) Treating RN: Dolan Amen Primary Care Provider: [name] Other Clinician: Referring Provider: [name] Treating Provider/Extender: [name] Cliche in Treatment: 219 History of Present Illness HPI Description: 12/04/16; 40 year old man who comes into the clinic today for review of a wound on the posterior left calf. He tells me that is been there for about a year. He is not a diabetic he does smoke half a pack per day. He was seen in the ER on 11/20/16 felt to have cellulitis around the wound and was given clindamycin. An x-ray did not show osteomyelitis. The patient initially tells me that he has a milk allergy that sets off a pruritic itching rash on his lower legs which she scratches incessantly and he thinks that's what may have set up the wound. He has been using various topical antibiotics and ointments without any effect. He works in a [employer] and is on his feet all day. He does not have a  prior history of wounds however he does have the rash on both lower legs the right arm and the ventral aspect of his left arm. These are excoriations and clearly have had scratching however there are of macular looking areas on both legs including a substantial larger area on the right leg. This does not have an underlying open area. There is no blistering. The patient tells me that 2 years ago in [place] in response to the rash on his legs he saw a dermatologist who told him he had a condition which may be pyoderma gangrenosum although I may be putting words into his mouth. He seemed to recognize this. On further questioning he admits to a 5 year history of quiesced. ulcerative colitis. He is not in any treatment for this. He's had no recent travel 12/11/16; the patient arrives today with his wound and roughly the same condition we've been using silver alginate this is a deep punched out wound with some surrounding erythema but no tenderness. Biopsy I did did not show confirmed pyoderma gangrenosum suggested nonspecific inflammation and vasculitis but does not provide an actual description of what was seen by the pathologist. I'm really not able to understand this We have also received information from the patient's dermatologist in [place] notes from April 2016. This was a doctor Agarwal-antal. 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 He was prescribed topical steroid high potency under occlusion which helped but at this point the patient did not have a deep punched out wound. 12/18/16; the patient's wound is larger in terms of surface area however this surface looks better and there is less depth. The surrounding erythema also is better. The patient states that the wrap we put on came off 2 days ago when he has been using his compression stockings.  He we are in the process of getting a dermatology consult. 12/26/16 on evaluation today patient's left lower extremity wound shows evidence of  infection with surrounding erythema noted. He has been tolerating the dressing changes but states that he has noted more discomfort. There is a larger area of erythema surrounding the wound. No fevers, chills, nausea, or vomiting noted at this time. With that being said the wound still does have slough covering the surface. He is not allergic to any medication that he is aware of at this point. In regard to his right lower extremity he had several regions that are erythematous and pruritic he wonders if there's anything we can do to help that. 01/02/17 I reviewed patient's wound culture which was obtained his visit last week. He was placed on doxycycline at that point. Unfortunately that does not appear to be an antibiotic that would likely help with the situation however the pseudomonas noted on culture is sensitive to Cipro. Also unfortunately patient's wound seems to have a large compared to last week's evaluation. Not severely so but there are definitely increased measurements in general. He is continuing to have discomfort as well he writes this to be a seven out of 10. In fact he would prefer me not to perform any debridement today due to the fact that he is having discomfort and considering he has an active infection on the little reluctant to do so anyway. No fevers, chills, nausea, or vomiting noted at this time. 01/08/17; patient seems dermatology on September 5. I suspect dermatology will want the slides from the biopsy I did sent to their pathologist. I'm not sure if there is a way we can expedite that. In any case the culture I did before I left on vacation 3 weeks ago showed Pseudomonas he was given 10 days of Cipro and per her description of her intake nurses is actually somewhat better this week although the wound is quite a bit bigger than I remember the last time I saw this. He still has 3 more days of Cipro 01/21/17; dermatology appointment tomorrow. He has completed the ciprofloxacin  for Pseudomonas. Surface of the wound looks better however he is had some deterioration in the lesions on his right leg. Meantime the left lateral leg wound we will continue with sample 01/29/17; patient had his dermatology appointment but I can't yet see that note. He is completed his antibiotics. The wound is more superficial but considerably larger in circumferential area than when he came in. This is in his left lateral calf. He also has swollen erythematous areas with superficial wounds on the right leg and small papular areas on both arms. There apparently areas in her his upper thighs and buttocks I did not look at those. Dermatology biopsied the right leg. Hopefully will have their input next week. 02/05/17; patient went back to see his dermatologist who told him that he had a "scratching problem" as well as staph. He is now on a 30 day course of doxycycline and I believe she gave him triamcinolone cream to the right leg areas to help with the itching [not exactly sure but probably triamcinolone]. She apparently looked at the left lateral leg wound although this was not rebiopsied and I think felt to be ultimately part of the same pathogenesis. He is using sample border foam and changing nevus himself. He now has a new open area on the right posterior leg which was his biopsy site I don't have any of the dermatology notes  02/12/17; we put the patient in compression last week with SANTYL to the wound on the left leg and the biopsy. Edema is much better and the depth of the wound is now at level of skin. Area is still the same oBiopsy site on the right lateral leg we've also been using santyl with a border foam dressing and he is changing this himself. 02/19/17; Using silver alginate started last week to both the substantial left leg wound and the biopsy site on the right wound. He is tolerating compression well. Has a an appointment with his primary M.D. tomorrow wondering about diuretics although  I'm wondering if the edema problem is actually lymphedema 02/26/17; the patient has been to see his primary doctor Dr. Jerrel Ivory at [facility] our primary care. She started him on Lasix 20 mg and this seems to have helped with the edema. However we are not making substantial change with the left lateral calf wound and inflammation. The biopsy site on the right leg also looks stable but not really all that different. 03/12/17; the patient has been to see vein and vascular Dr. Lucky Cowboy. He has had venous reflux studies I have not reviewed these. I did get a call from his dermatology office. They felt that he might have pathergy based on their biopsy on his right leg which led them to look at the slides of [name], [name] ([id number]) the biopsy I did on the left leg and they wonder whether this represents pyoderma gangrenosum which was the original supposition in a man with ulcerative colitis albeit inactive for many years. They therefore recommended clobetasol and tetracycline i.e. 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 03/26/17; apparently the patient just had reflux studies not an appointment with Dr. dew. She arrives in clinic today having applied clobetasol for 2-3 weeks. He notes over the last 2-3 days excessive drainage having to change the dressing 3-4 times a day and also expanding erythema. 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 Lillia Carmel at [facility] dermatology locally who kindly saw him at my request. 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 My concern was the degree of erythema medially extending from his left leg wound which was either inflammation from pyoderma  or cellulitis. I put him on Augmentin however culture of the wound showed Pseudomonas which is quinolone sensitive. I really don't believe he has cellulitis however in view of everything I will continue and give him a course of Cipro. He is also on doxycycline as an immune modulator for the pyoderma. In addition to his original wound on the left lateral leg with surrounding erythema he has a wound on the right posterior calf which was an original biopsy site done by dermatology. 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 Saw Dr. Lillia Carmel yesterday. He has been using topical antibiotics to both wound areas his original wound on the left and the biopsies/pathergy area on the right. There is definitely some improvement in the inflammation around the wound on the right although the patient states he has increasing sensitivity of the wounds. He is on prednisone 60 and doxycycline 1 as prescribed by Dr. Lillia Carmel. He is covering the topical antibiotic with gauze  and putting this in his own compression stocks and changing this daily. He states that Dr. Lottie Rater did a culture of the left leg wound yesterday 05/07/17; pyoderma gangrenosum. The patient saw Dr. Lillia Carmel yesterday and has a follow-up with her in one month. He is still using topical antibiotics to both wounds although he can't recall exactly what type. He is still on prednisone 60 mg. Dr. Lillia Carmel stated that the doxycycline could stop if we were in agreement. 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 The right medial leg was induced by biopsy/pathergy. The area on the right is essentially healed. 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 The right medial leg has closed and remains closed. He  is still on prednisone 60. oHe tells me he missed his last dermatology appointment with Dr. Lillia Carmel but will make another appointment. He reports that her blood sugar at a recent screen in [place] was high 200's. He was 180 today. 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 I think he is going to require still much longer prednisone perhaps another 3 months before attempting to taper. In the meantime his wound is a lot better. Smaller. He is cleaning this off daily and applying topical antibiotics. When he was last in the clinic I thought about changing to [facility] and actually put in a couple of calls to dermatology although probably not during their business hours. 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 He continues on oral prednisone. He has been using triple antibiotic ointment. There is surface debris and we will transition to [facility] and have him return in 2 weeks. He has lost 30 pounds since his last appointment with lifestyle modification. He may benefit from topical steroid cream for treatment this can be considered at a later date. 08/22/17 on evaluation today patient appears to actually be doing rather well in regard to his left lateral lower extremity ulcer. He has actually been managed by Dr. Dellia Nims most recently. Patient is currently on oral steroids at this time. This seems to have been of benefit for him. Nonetheless his last visit was actually with Leah on 08/06/17. Currently he is not utilizing any topical steroid creams although this could be of benefit as well. No fevers, chills, nausea, or vomiting noted at this time. 09/05/17 on evaluation today patient appears to be doing better in regard to his left lateral lower extremity ulcer. He has been tolerating the dressing changes without complication. He is using Santyl with good effect. Overall I'm  very pleased with how things are standing at this point. Patient likewise is happy that this is doing better. 09/19/17 on evaluation today patient actually appears to be doing rather well in regard to his left lateral lower extremity ulcer. 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 He's not having any significant pain. 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 He has been tolerating the Santyl without complication and in general I feel like he's making good progress. 10/17/17 on evaluation today patient  appears to be doing very well in regard to his left lateral lower surety ulcer. He has been tolerating the dressing changes without complication. There does not appear to be any evidence of infection he's alternating the Santyl and the triple antibiotic ointment every other day this seems to be doing well for him. 11/03/17 on evaluation today patient appears to be doing very well in regard to his left lateral lower extremity ulcer. He is been tolerating the dressing changes without complication which is good news. Fortunately there does not appear to be any evidence of infection which is also great news. Overall is doing excellent they are starting to taper down on the prednisone is down to 40 mg at this point it also started topical clobetasol for him. 11/17/17 on evaluation today patient appears to be doing well in regard to his left lateral lower surety ulcer. He's been tolerating the dressing changes without complication. He does note that he is having no pain, no excessive drainage or discharge, and overall he feels like things are going about how he would expect and hope they would. Overall he seems to have no evidence of infection at this time in my opinion which is good news. 12/04/17-He is seen in follow-up  evaluation for right lateral lower extremity ulcer. He has been applying topical steroid cream. Today's measurement show slight increase in size. Over the next 2 weeks we will transition to every other day Santyl and steroid cream. He has been encouraged to monitor for changes and notify clinic with any concerns 12/15/17 on evaluation today patient's left lateral motion the ulcer and fortunately is doing worse again at this point. This just since last week to this week has close to doubled in size according to the patient. I did not seeing last week's I do not have a visual to compare this to in our system was also down so we do not have all the charts and at this point. Nonetheless it does have me somewhat concerned in regard to the fact that again he was worried enough about it he has contact the dermatology that placed them back on the full strength, 50 mg a day of the prednisone that he was taken previous. He continues to alternate using clobetasol along with Santyl at this point. He is obviously somewhat frustrated. 12/22/17 on evaluation today patient appears to be doing a little worse compared to last evaluation. Unfortunately the wound is a little deeper and slightly larger than the last week's evaluation. With that being said he has made some progress in regard to the irritation surrounding at this time unfortunately despite that progress that's been made he still has a significant issue going on here. 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 YI, HAUGAN ([id number]) For that reason I discussed with him today the possibility of perform a wound culture to ensure there's nothing overtly infected. 01/06/18 on evaluation today patient's wound is larger and deeper than previously evaluated. With that being said it did appear that his wound was  infected after my last evaluation with him. Subsequently I did end up prescribing a prescription for Bactrim DS which she has been taking and having no complication with. Fortunately there does not appear to be any evidence of infection at this point in time as far as anything spreading, no want to touch, and overall I feel like  things are showing signs of improvement. 01/13/18 on evaluation today patient appears to be even a little larger and deeper than last time. There still muscle exposed in the base of the wound. Nonetheless he does appear to be less erythematous I do believe inflammation is calming down also believe the infection looks like it's probably resolved at this time based on what I'm seeing. No fevers, chills, nausea, or vomiting noted at this time. 01/30/18 on evaluation today patient actually appears to visually look better for the most part. Unfortunately those visually this looks better he does seem to potentially have what may be an abscess in the muscle that has been noted in the central portion of the wound. This is the first time that I have noted what appears to be fluctuance in the central portion of the muscle. With that being said I'm somewhat more concerned about the fact that this might indicate an abscess formation at this location. I do believe that an ultrasound would be appropriate. This is likely something we need to try to do as soon as possible. He has been switch to mupirocin ointment and he is no longer using the steroid ointment as prescribed by dermatology he sees them again next week he's been decreased from 60 to 40 mg of prednisone. 03/09/18 on evaluation today patient actually appears to be doing a little better compared to last time I saw him. There's not as much erythema surrounding the wound itself. He I did review his most recent infectious disease note which was dated 02/24/18. He saw Dr. Michel Bickers in [place]. 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 Patient is now off of antibiotics and they are continually observing this. There seems to be no change in the past two weeks in my pinion based on what the patient says and what I see today compared to what Dr. Megan Salon likely saw two weeks ago. No fevers, chills, nausea, or vomiting noted at this time. 03/23/18 on evaluation today patient's wound actually appears to be showing signs of improvement which is good news. He is currently still on the Dapsone. He is also working on tapering the prednisone to get off of this and Dr. Lottie Rater is working with him in this regard. Nonetheless overall I feel like the wound is doing well it does appear based on the infectious disease note that I reviewed from Dr. 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 I did also review the results of his ultrasound of left lower extremity which revealed there was a dentist tissue in the base of the wound without an abscess noted. 04/06/18 on evaluation today the patient's left lateral lower extremity ulcer actually appears to be doing fairly well which is excellent news. There does not appear to be any evidence of infection at this time which is also great news. Overall he still does have a significantly large ulceration although little by little he seems to be making progress. He is down to 10 mg a day of the prednisone. 04/20/18 on evaluation today patient actually appears to be doing excellent at this time in regard to his left lower extremity ulcer. He's making signs of good progress unfortunately this is taking much longer than we would really like to see but nonetheless he is  making progress. Fortunately there does not appear to be any evidence of infection at this time. No fevers, chills, nausea, or vomiting noted at  this time. The patient has not been using the Santyl due to the cost he hadn't got in this field yet. He's mainly been using the antibiotic ointment topically. Subsequently he also tells me that he really has not been scrubbing in the shower I think this would be helpful again as I told him it doesn't have to be anything too aggressive to even make it believe just enough to keep it free of some of the loose slough and biofilm on the wound surface. 05/11/18 on evaluation today patient's wound appears to be making slow but sure progress in regard to the left lateral lower extremity ulcer. He is been tolerating the dressing changes without complication. Fortunately there does not appear to be any evidence of infection at this time. He is still just using triple antibiotic ointment along with clobetasol occasionally over the area. He never got the Santyl and really does not seem to intend to in my pinion. 06/01/18 on evaluation today patient appears to be doing a little better in regard to his left lateral lower extremity ulcer. He states that overall he does not feel like he is doing as well with the Dapsone as he did with the prednisone. Nonetheless he sees his dermatologist later today and is gonna talk to them about the possibility of going back on the prednisone. Overall again I believe that the wound would be better if you would utilize Santyl but he really does not seem to be interested in going back to the [place] at this point. He has been using triple antibiotic ointment. 06/15/18 on evaluation today patient's wound actually appears to be doing about the same at this point. Fortunately there is no signs of infection at this time. He has made slight improvements although he continues to not really want to clean the wound bed at this point. He states that he just doesn't mess with it he doesn't want to cause any problems with everything else he has going on. He has been on medication, antibiotics  as prescribed by his dermatologist, for a staff infection of his lower extremities which is really drying out now and looking much better he tells me. Fortunately there is no sign of overall infection. 06/29/18 on evaluation today patient appears to be doing well in regard to his left lateral lower surety ulcer all things considering. Fortunately his staff infection seems to be greatly improved compared to previous. He has no signs of infection and this is drying up quite nicely. He is still the doxycycline for this is no longer on cental, Dapsone, or any of the other medications. His dermatologist has recommended possibility of an infusion but right now he does not want to proceed with that. 07/13/18 on evaluation today patient appears to be doing about the same in regard to his left lateral lower surety ulcer. Fortunately there's no signs of infection at this time which is great news. Unfortunately he still builds up a significant amount of Slough/biofilm of the surface of the wound he still is not really cleaning this as he should be appropriately. Again I'm able to easily with saline and gauze remove the majority of this on the surface which if you would do this at home would likely be a dramatic improvement for him as far as getting the area to improve. Nonetheless overall I still feel like he  is making progress is just very slow. I think Santyl will be of benefit for him as well. Still he has not gotten this as of this point. 07/27/18 on evaluation today patient actually appears to be doing little worse in regards of the erythema around the periwound region of the wound he also tells me that he's been having more drainage currently compared to what he was experiencing last time I saw him. He states not quite as bad as what he had because this was infected previously but nonetheless is still appears to be doing poorly. Fortunately there is no evidence of systemic infection at this point. The patient  tells me that he is not going to be able to afford the Santyl. He is still waiting to hear about the infusion therapy with his dermatologist. Apparently she wants an updated colonoscopy first. 08/10/18 on evaluation today patient appears to be doing better in regard to his left lateral lower extremity ulcer. Fortunately he is showing signs of improvement in this regard he's actually been approved for Remicade infusion's as well although this has not been scheduled as of yet. Fortunately there's no signs of active infection at this time in regard to the wound although he is having some issues with infection of the right lower extremity is been seen as dermatologist for this. Fortunately they are definitely still working with him trying to keep things under control. [name], [name] ([id number]) 09/07/18 on evaluation today patient is actually doing rather well in regard to his left lateral lower extremity ulcer. He notes these actually having some hair grow back on his extremity which is something he has not seen in years. He also tells me that the pain is really not giving them any trouble at this time which is also good news overall she is very pleased with the progress he's using a combination of the mupirocin along with the probate is all mixed. 09/21/18 on evaluation today patient actually appears to be doing fairly well all things considered in regard to his looks from the ulcer. He's been tolerating the dressing changes without complication. Fortunately there's no signs of active infection at this time which is good news he is still on all antibiotics or prevention of the staff infection. He has been on prednisone for time although he states it is gonna contact his dermatologist and see if she put them on a short course due to some irritation that he has going on currently. Fortunately there's no evidence of any overall worsening this is going very slow I think cental would be something that would be  helpful for him although he states that $50 for tube is quite expensive. He therefore is not willing to get that at this point. 10/06/18 on evaluation today patient actually appears to be doing decently well in regard to his left lateral leg ulcer. He's been tolerating the dressing changes without complication. Fortunately there's no signs of active infection at this time. Overall I'm actually rather pleased with the progress he's making although it's slow he doesn't show any signs of infection and he does seem to be making some improvement. I do believe that he may need a switch up and dressings to try to help this to heal more appropriately and quickly. 10/19/18 on evaluation today patient actually appears to be doing better in regard to his left lateral lower extremity ulcer. This is shown signs of having much less Slough buildup at this point due to the fact he has been using  the Santyl. Obviously this is very good news. The overall size of the wound is not dramatically smaller but again the appearance is. 11/02/18 on evaluation today patient actually appears to be doing quite well in regard to his lower [place] ulcer. A lot of the skin around the ulcer is actually somewhat irritating at this point this seems to be more due to the dressing causing irritation from the adhesive that anything else. Fortunately there is no signs of active infection at this time. 11/24/18 on evaluation today patient appears to be doing a little worse in regard to his overall appearance of his lower extremity ulcer. There's more erythema and warmth around the wound unfortunately. He is currently on doxycycline which he has been on for some time. With that being said I'm not sure that seems to be helping with what appears to possibly be an acute cellulitis with regard to his left lower extremity ulcer. No fevers, chills, nausea, or vomiting noted at this time. 12/08/18 on evaluation today patient's wounds actually appears to  be doing significantly better compared to his last evaluation. He has been using Santyl along with alternating tripling about appointment as well as the steroid cream seems to be doing quite well and the wound is showing signs of improvement which is excellent news. Fortunately there's no evidence of infection and in fact his culture came back negative with only normal skin flora noted. 12/21/2018 upon evaluation today patient actually appears to be doing excellent with regard to his ulcer. This is actually the best that I have seen it since have been helping to take care of him. It is both smaller as well as less slough noted on the surface of the wound and seems to be showing signs of good improvement with new skin growing from the edges. He has been using just the triamcinolone he does wonder if he can get a refill of that ointment today. 01/04/2019 upon evaluation today patient actually appears to be doing well with regard to his left lateral lower extremity ulcer. With that being said it does not appear to be that he is doing quite as well as last time as far as progression is concerned. There does not appear to be any signs of infection or significant irritation which is good news. With that being said I do believe that he may benefit from switching to a collagen based dressing based on how clean The wound appears. 01/18/2019 on evaluation today patient actually appears to be doing well with regard to his wound on the left lower extremity. He is not made a lot of progress compared to where we were previous but nonetheless does seem to be doing okay at this time which is good news. There is no signs of active infection which is also good news. My only concern currently is I do wish we can get him into utilizing the collagen dressing his insurance would not pay for the supplies that we ordered although it appears that he may be able to order this through his supply company that he typically utilizes.  This is [employer]. Nonetheless he did try to order it during the office visit today and it appears this did go through. We will see if he can get that it is a different brand but nonetheless he has collagen and I do think will be beneficial. 02/01/2019 on evaluation today patient actually appears to be doing a little worse today in regard to the overall size of his wounds. Fortunately there  is no signs of active infection at this time. That is visually. Nonetheless when this is happened before it was due to infection. For that reason were somewhat concerned about that this time as well. 02/08/2019 on evaluation today patient unfortunately appears to be doing slightly worse with regard to his wound upon evaluation today. Is measuring a little deeper and a little larger unfortunately. I am not really sure exactly what is causing this to enlarge he actually did see his dermatologist she is going to see about initiating Humira for him. Subsequently she also did do steroid injections into the wound itself in the periphery. Nonetheless still nonetheless he seems to be getting a little bit larger he is gone back to just using the steroid cream topically which I think is appropriate. I would say hold off on the collagen for the time being is definitely a good thing to do. Based on the culture results which we finally did get the final result back regarding it shows staph as the bacteria noted again that can be a normal skin bacteria based on the fact however he is having increased drainage and worsening of the wound measurement wise I would go ahead and place him on an antibiotic today I do believe for this. 02/15/2019 on evaluation today patient actually appears to be doing somewhat better in regard to his ulcer. There is no signs of worsening at this time I did review his culture results which showed evidence of Staphylococcus aureus but not MRSA. Again this could just be more related to the normal skin  bacteria although he states the drainage has slowed down quite a bit he may have had a mild infection not just colonization. And was much smaller and then since around10/04/2019 on evaluation today patient appears to be doing unfortunately worse as far as the size of the wound. I really feel like that this is steadily getting larger again it had been doing excellent right at the beginning of September we have seen a steady increase in the area of the wound it is almost 2-1/2 times the size it was on September 1. Obviously this is a bad trend this is not wanting to see. For that reason we went back to using just the topical triamcinolone cream which does seem to help with inflammation. I checked him for bacteria by way of culture and nothing showed positive there. I am considering giving him a short course of a tapering steroid Dosepak today to see if that is can be beneficial for him. The patient is in agreement with giving that a try. 03/08/2019 on evaluation today patient appears to be doing very well in comparison to last evaluation with regard to his lower extremity ulcer. This is showing signs of less inflammation and actually measuring slightly smaller compared to last time every other week over the past month and a half he has been measuring larger larger larger. Nonetheless I do believe that the issue has been inflammation the prednisone does seem to [facility] ([id number]) have been beneficial for him which is good news. No fevers, chills, nausea, vomiting, or diarrhea. 03/22/2019 on evaluation today patient appears to be doing about the same with regard to his leg ulcer. He has been tolerating the dressing changes without complication. With that being said the wound seems to be mostly arrested at its current size but really is not making any progress except for when we prescribed the prednisone. He did show some signs of dropping as far as  the overall size of the wound during that interval  week. Nonetheless this is something he is not on long-term at this point and unfortunately I think he is getting need either this or else the Humira which his dermatologist has discussed try to get approval for. With that being said he will be seeing his dermatologist on the 11th of this month that is November. 04/19/2019 on evaluation today patient appears to be doing really about the same the wound is measuring slightly larger compared to last time I saw him. He has not been into the office since November 2 due to the fact that he unfortunately had Covid as that his entire family. He tells me that it was rough but they did pull-through and he seems to be doing much better. Fortunately there is no signs of active infection at this time. No fevers, chills, nausea, vomiting, or diarrhea. 05/10/2019 on evaluation today patient unfortunately appears to be doing significantly worse as compared to last time I saw him. He does tell me that he has had his first dose of Humira and actually is scheduled to get the next one in the upcoming week. With that being said he tells me also that in the past several days he has been having a lot of issues with green drainage she showed me a picture this is more blue-green in color. He is also been having issues with increased sloughy buildup and the wound does appear to be larger today. Obviously this is not the direction that we want everything to take based on the starting of his Humira. Nonetheless I think this is definitely a result of likely infection and to be honest I think this is probably Pseudomonas causing the infection based on what I am seeing. 05/24/2019 on evaluation today patient unfortunately appears to be doing significantly worse compared to his prior evaluation with me 2 weeks ago. I did review his culture results which showed that he does have Staph aureus as well as Pseudomonas noted on the culture. Nonetheless the Levaquin that I prescribed for him  does not appear to have been appropriate and in fact he tells me he is no longer experiencing the green drainage and discharge that he had at the last visit. Fortunately there is no signs of active infection at this time which is good news although the wound has significantly worsened it in fact is much deeper than it was previous. We have been utilizing up to this point triamcinolone ointment as the prescription topical of choice but at this time I really feel like that the wound is getting need to be packed in order to appropriately manage this due to the deeper nature of the wound. Therefore something along the lines of an alginate dressing may be more appropriate. 05/31/2019 upon inspection today patient's wound actually showed signs of doing poorly at this point. Unfortunately he just does not seem to be making any good progress despite what we have tried. He actually did go ahead and pick up the Cipro and start taking that as he was noticing more green drainage he had previously completed the Levaquin that I prescribed for him as well. Nonetheless he missed his appointment for the seventh last week on Wednesday with the wound care center and [facility] where his dermatologist referred him. Obviously I do think a second opinion would be helpful at this point especially in light of the fact that the patient seems to be doing so poorly despite the fact  that we have tried everything that I really know how at this point. The only thing that ever seems to have helped him in the past is when he was on high doses of continual steroids that did seem to make a difference for him. 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 06/07/2019 upon evaluation today patient unfortunately appears to be doing worse yet again with regard to his wound. In fact I  am starting to question whether or not he may have a fluid pocket in the muscle at this point based on the bulging and the soft appearance to the central portion of the muscle area. There is not anything draining from the muscle itself at this time which is good news but nonetheless the wound is expanding. I am not really seeing any results of the Humira as far as overall wound progression based on what I am seeing at this point. The patient has been referred for second opinion with regard to his wound to the [facility] wound care center by his dermatologist which I definitely am not in opposition to. Unfortunately we tried multiple dressings in the past including collagen, alginate, and at one point even Hydrofera Blue. With that being said he is never really used it for any significant amount of time due to the fact that he often complains of pain associated with these dressings and then will go back to either using the Santyl which she has done intermittently or more frequently the triamcinolone. He is also using his own compression stockings. We have wrapped him in the past but again that was something else that he really was not a big fan of. 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 He seemed to do much better when he was on the steroids although honestly I understand that the steroids are not necessarily the best medication to be on long-term obviously 06/14/2019 on evaluation today patient appears to be doing actually a little bit better with regard to the overall appearance with his leg. Unfortunately he does continue to have issues with what appears to be some fluid underneath  the muscle although he did see the wound specialty center at [facility] last week their main goals were to see about infusion therapy in place of the Humira as they feel like that is not quite strong enough. They also recommended that we continue with the treatment otherwise as we are they felt like that was appropriate and they are okay with him continuing to follow-up here with [place] in that regard. With that being said they are also sending him to the vein specialist there to see about vein stripping and if that would be of benefit for him. Subsequently they also did not really address whether or not an ultrasound of the muscle area to see if there is anything that needs to be addressed here would be appropriate or not. For that reason I discussed this with him last week I think we  may proceed down that road at this point. 06/21/2019 upon evaluation today patient's wound actually appears to be doing slightly better compared to previous evaluations. I do believe that he has made a difference with regard to the progression here with the use of oral steroids. Again in the past has been the only thing that is really calm things down. He does tell me that from [facility] is gotten a good news from there that there are no further vein stripping that is necessary at this point. I do not have that available for review today although the patient did relay this to me. He also did obtain and have the ultrasound of the wound completed which I did sign off on today. It does appear that there is no fluid collection under the muscle this is likely then just edematous tissue in general. That is also good news. Overall I still believe the inflammation is the main issue here. He did inquire about the possibility of a wound VAC again with the muscle protruding like it is I am not really sure whether the wound VAC is necessarily ideal or not. That is something we will have to consider although I do believe he may need compression  wrapping to try to help with edema control which could potentially be of benefit. 06/28/2019 on evaluation today patient appears to be doing slightly better measurement wise although this is not terribly smaller he least seems to be trending towards that direction. With that being said he still seems to have purulent drainage noted in the wound bed at this time. He has been on Levaquin followed by Cipro over the past month. Unfortunately he still seems to have some issues with active infection at this time. I did perform a culture last week in order to evaluate and see if indeed there was still anything going on. Subsequently the culture did come back showing Pseudomonas which is consistent with the drainage has been having which is blue-green in color. He also has had an odor that again was somewhat consistent with Pseudomonas as well. Long story short it appears that the culture showed an intermediate finding with regard to how well the Cipro will work for the Pseudomonas infection. Subsequently being that he does not seem to be clearing up and at best what we are doing is just keeping this at [place] I think he may need to see infectious disease to discuss IV antibiotic options. [name], [name] ([id number]) 07/05/2019 upon evaluation today patient appears to be doing okay in regard to his leg ulcer. He has been tolerating the dressing changes at this point without complication. Fortunately there is no signs of active infection at this time which is good news. No fevers, chills, nausea, vomiting, or diarrhea. With that being said he does have an appointment with infectious disease tomorrow and his primary care on Wednesday. Again the reason for the infectious disease referral was due to the fact that he did not seem to be fully resolving with the use of oral antibiotics and therefore we were thinking that IV antibiotic therapy may be necessary secondary to the fact that there was an intermediate finding for  how effective the Cipro may be. Nonetheless again he has been having a lot of purulent and even green drainage. Fortunately right now that seems to have calmed down over the past week with the reinitiation of the oral antibiotic. Nonetheless we will see what Dr. Megan Salon has to say. 07/12/2019 upon evaluation today patient appears to be  doing about the same at this point in regard to his left lower extremity ulcer. Fortunately there is no signs of active infection at this time which is good news I do believe the Levaquin has been beneficial I did review Dr. Hale Bogus note and to be honest I agree that the patient's leg does appear to be doing better currently. What we found in the past as he does not seem to really completely resolve he will stop the antibiotic and then subsequently things will revert back to having issues with blue-green drainage, increased pain, and overall worsening in general. Obviously that is the reason I sent him back to infectious disease. 07/19/2019 upon evaluation today patient appears to be doing roughly the same in size there is really no dramatic improvement. He has started back on the Levaquin at this point and though he seems to be doing okay he did still have a lot of blue/green drainage noted on evaluation today unfortunately. I think that this is still indicative more likely of a Pseudomonas infection as previously noted and again he does see Dr. Megan Salon in just a couple of days. I do not know that were really able to effectively clear this with just oral antibiotics alone based on what I am seeing currently. Nonetheless we are still continue to try to manage as best we can with regard to the patient and his wound. I do think the wrap was helpful in decreasing the edema which is excellent news. No fevers, chills, nausea, vomiting, or diarrhea. 07/26/2019 upon evaluation today patient appears to be doing slightly better with regard to the overall appearance of the muscle  there is no dark discoloration centrally. Fortunately there is no signs of active infection at this time. No fevers, chills, nausea, vomiting, or diarrhea. Patient's wound bed currently the patient did have an appointment with Dr. Megan Salon at infectious disease last week. With that being said Dr. Megan Salon the patient states was still somewhat hesitant about put him on any IV antibiotics he wanted [place] to repeat cultures today and then see where things go going forward. He does look like Dr. Megan Salon because of some improvement the patient did have with the Levaquin wanted [place] to see about repeating cultures. If it indeed grows the Pseudomonas again then he recommended a possibility of considering a PICC line placement and IV antibiotic therapy. He plans to see the patient back in 1 to 2 weeks. 08/02/2019 upon evaluation today patient appears to be doing poorly with regard to his left lower extremity. We did get the results of his culture back it shows that he is still showing evidence of Pseudomonas which is consistent with the purulent/blue-green drainage that he has currently. Subsequently the culture also shows that he now is showing resistance to the oral fluoroquinolones which is unfortunate as that was really the only thing to treat the infection prior. I do believe that he is looking like this is going require IV antibiotic therapy to get this under control. Fortunately there is no signs of systemic infection at this time which is good news. The patient does see Dr. Megan Salon tomorrow. 08/09/2019 upon evaluation today patient appears to be doing better with regard to his left lower extremity ulcer in regard to the overall appearance. He is currently on IV antibiotic therapy. As ordered by Dr. Megan Salon. Currently the patient is on ceftazidime which she is going to take for the next 2 weeks and then follow-up for 4 to 5-week appointment with Dr. Megan Salon.  The patient started this this past Friday  symptoms have not for a total of 3 days currently in full. 08/16/2019 upon evaluation today patient's wound actually does show muscle in the base of the wound but in general does appear to be much better as far as the overall evidence of infection is concerned. In fact I feel like this is for the most part cleared up he still on the IV antibiotics he has not completed the full course yet but I think he is doing much better which is excellent news. 08/23/2019 upon evaluation today patient appears to be doing about the same with regard to his wound at this point. He tells me that he still has pain unfortunately. Fortunately there is no evidence of systemic infection at this time which is great news. There is significant muscle protrusion. 09/13/19 upon evaluation today patient appears to be doing about the same in regard to his leg unfortunately. He still has a lot of drainage coming from the ulceration there is still muscle exposed. With that being said the patient's last wound culture still showed an intermediate finding with regard to the Pseudomonas he still having the bluish/green drainage as well. Overall I do not know that the wound has completely cleared of infection at this point. Fortunately there is no signs of active infection systemically at this point which is good news. 09/20/2019 upon evaluation today patient's wound actually appears to be doing about the same based on what I am seeing currently. I do not see any signs of systemic infection he still does have evidence of some local infection and drainage. He did see Dr. Megan Salon last week and Dr. Megan Salon states that he probably does need a different IV antibiotic although he does not want to put him on this until the patient begins the Remicade infusion which is actually scheduled for about 10 days out from today on 13 May. Following that time Dr. Megan Salon is good to see him back and then will evaluate the feasibility of starting him on the  IV antibiotic therapy once again at that point. I do not disagree with this plan I do believe as Dr. Megan Salon stated in his note that I reviewed today that the patient's issue is multifactorial with the pyoderma being 1 aspect of this that were hoping the Remicade will be helpful for her. In the meantime I think the gentamicin is, helping to keep things under decent okay control in regard to the ulcer. 09/27/2019 upon evaluation today patient appears to be doing about the same with regard to his wound still there is a lot of muscle exposure though he does have some hyper granulation tissue noted around the edge and actually some granulation tissue starting to form over the muscle which is actually good news. Fortunately there is no evidence of active infection which is also good news. His pain is less at this point. 5/21; this is a patient I have not seen in a long time. 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 He has a large wound on the left lateral leg with protruding muscle. He comes in the clinic today showing the same area on his left medial ankle. He says there is been a spot there for some time although we have not previously defined this. Today he has a clearly defined area with slight amount of skin breakdown surrounded by raised areas with a purplish hue in color. This is not painful he says it is irritated.  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 He still has muscle protruding from the lateral portion of his left leg but fortunately the new area that they were concerned about at his last visit does not appear to have opened at this point. He is currently on Remicade infusions and seems to be doing better in my opinion in fact the wound itself seems to be overall much better. The purplish discoloration that he did have seems to have resolved  and I think that is a good sign that hopefully the Remicade is doing its job. He does have some biofilm noted over the surface of the wound. 11/01/2019 on evaluation today patient's wound actually appears to be doing excellent at this time. Fortunately there is no evidence of active infection and overall I feel like he is making great progress. The Remicade seems to be due excellent job in my opinion. [name], [name] ([id number]) 11/08/19 evaluation today vision actually appears to be doing quite well with regard to his weight ulcer. He's been tolerating dressing changes without complication. Fortunately there is no evidence of infection. No fevers, chills, nausea, or vomiting noted at this time. Overall states that is having more itching than pain which is actually a good sign in my opinion. 12/13/2019 upon evaluation today patient appears to be doing well today with regard to his wound. He has been tolerating the dressing changes without complication. Fortunately there is no sign of active infection at this time. No fevers, chills, nausea, vomiting, or diarrhea. Overall I feel like the infusion therapy has been very beneficial for him. 01/06/2020 on evaluation today patient appears to be doing well with regard to his wound. This is measuring smaller and actually looks to be doing better. Fortunately there is no signs of active infection at this point. No fevers, chills, nausea, vomiting, or diarrhea. With that being said he does still have the blue-green drainage but this does not seem to be causing any significant issues currently. He has been using the gentamicin that does seem to be keeping things under decent control at this point. He goes later this morning for his next infusion therapy for the pyoderma which seems to also be very beneficial. 02/07/2020 on evaluation today patient appears to be doing about the same in regard to his wounds currently. Fortunately there is no signs of active infection  systemically he does still have evidence of local infection still using gentamicin. He also is showing some signs of improvement albeit slowly I do feel like we are making some progress here. 02/21/2020 upon evaluation today patient appears to be making some signs of improvement the wound is measuring a little bit smaller which is great news and overall I am very pleased with where he stands currently. He is going to be having infusion therapy treatment on the 15th of this month. Fortunately there is no signs of active infection at this time. 03/13/2020 I do believe patient's wound is actually showing some signs of improvement here which is great news. He has continue with the infusion therapy through rheumatology/dermatology at [facility]. That does seem to be beneficial. I still think he gets as much benefit from this as he did from the prednisone initially but nonetheless obviously this is less harsh on his body that the prednisone as far as they are concerned. 03/31/2020 on evaluation today patient's wound actually showing signs of some pretty good improvement in regard  to the overall appearance of the wound bed. There is still muscle exposed though he does have some epithelial growth around the edges of the wound. Fortunately there is no signs of active infection at this time. No fevers, chills, nausea, vomiting, or diarrhea. 04/24/2020 upon evaluation today patient appears to be doing about the same in regard to his leg ulcer. He has been tolerating the dressing changes without complication. Fortunately there is no signs of active infection at this time. No fevers, chills, nausea, vomiting, or diarrhea. With that being said he still has a lot of irritation from the bandaging around the edges of the wound. We did discuss today the possibility of a referral to plastic surgery. 05/22/2020 on evaluation today patient appears to be doing well with regard to his wounds all things considered. He has not been able  to get the Chantix apparently there is a recall nurse that I was unaware of put out by [employer] involuntarily. Nonetheless for now I am and I have to do some research into what may be the best option for him to help with quitting in regard to smoking and we discussed that today. 06/26/2020 upon evaluation today patient appears to be doing well with regard to his wound from the standpoint of infection I do not see any signs of infection at this point. With that being said unfortunately he is still continuing to have issues with muscle exposure and again he is not having a whole lot of new skin growth unfortunately. There does not appear to be any signs of active infection at this time. No fevers, chills, nausea, vomiting, or diarrhea. 07/10/2020 upon evaluation today patient appears to be doing a little bit more poorly currently compared to where he was previous. I am concerned currently about an active infection that may be getting worse especially in light of the increased size and tenderness of the wound bed. No fevers, chills, nausea, vomiting, or diarrhea. 07/24/2020 upon evaluation today patient appears to be doing poorly in regard to his leg ulcer. He has been tolerating the dressing changes without complication but unfortunately is having a lot of discomfort. 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 Subsequently I think he is going require possibly IV antibiotics to get this under control. I am very concerned about the severity of his infection and the amount of discomfort he is having. 07/31/2020 upon evaluation today patient appears to be doing about the same in regard to his leg wound. He did see Dr. Megan Salon and Dr. Megan Salon is actually going to start him on IV antibiotics. He goes for the PICC line tomorrow. With that being said there do not have that run for 2 weeks and then see how things are doing and depending on how he  is progressing they may extend that a little longer. Nonetheless I am glad this is getting ready to be in place and definitely feel it may help the patient. In the meantime is been using mainly triamcinolone to the wound bed has an anti-inflammatory. 08/07/2020 on evaluation today patient appears to be doing well with regard to his wound compared even last week. In the interim he has gotten the PICC line placed and overall this seems to be doing excellent. There does not appear to be any evidence of infection which is great news systemically although locally of course has had the infection this appears to be improving with the use of the antibiotics. 08/14/2020 upon  evaluation today patient's wound actually showing signs of excellent improvement. Overall the irritation has significantly improved the drainage is back down to more of a normal level and his pain is really pretty much nonexistent compared to what it was. Obviously I think that this is significantly improved secondary to the IV antibiotic therapy which has made all the difference in the world. Again he had a resistant form of Pseudomonas for which oral antibiotics just was not cutting it. Nonetheless I do think that still we need to consider the possibility of a surgical closure for this wound is been open so long and to be honest with muscle exposed I think this can be very hard to get this to close outside of this although definitely were still working to try to do what we can in that regard. 08/21/2020 upon evaluation today patient appears to be doing very well with regard to his wounds on the left lateral lower extremity/calf area. Fortunately there does not appear to be signs of active infection which is great news and overall very pleased with where things stand today. He is actually wrapping up his treatment with IV antibiotics tomorrow. After that we will see where things go from there. 08/28/2020 upon evaluation today patient appears  to be doing decently well with regard to his leg ulcer. There does not appear to be any signs of active infection which is great news and overall very pleased with where things stand today. No fevers, chills, nausea, vomiting, or diarrhea. 09/18/2020 upon evaluation today patient appears to be doing well with regard to his infection which I feel like is better. Unfortunately he is not doing as well with regard to the overall size of the wound which is not nearly as good at this point. I feel like that he may be having an issue here with the pyoderma being somewhat out of control. I think that he may benefit from potentially going back and talking to the dermatologist about IZAYIAH, TIBBITTS ([id number]) what to do from the pyoderma standpoint. I am not certain if the infusions are helping nearly as much is what the prednisone did in the past. 10/02/2020 upon evaluation today patient appears to be doing well with regard to his leg ulcer. He did go to the Psychiatric nurse. Unfortunately they feel like there is a 10% chance that most that he would be able to heal and that the skin graft would take. Obviously this has led him to not be able to go down that path as far as treatment is concerned. Nonetheless he does seem to be doing a little bit better with the prednisone that I gave him last time. I think that he may need to discuss with dermatology the possibility of long-term prednisone as that seems to be what is most helpful for him to be perfectly honest. I am not sure the Remicade is really doing the job. 10/17/2020 upon evaluation today patient appears to be doing a little better in regard to his wound. In fact the case has been since we did the prednisone on May 2 for him that we have noticed a little bit of improvement each time we have seen a size wise as well as appearance wise as well as pain wise. I think the prednisone has had a greater effect then the infusion therapy has to be perfectly honest. With  that being said the patient also feels significantly better compared to what he was previous. All of this is good news but  nonetheless I am still concerned about the fact that again we are really not set up to long-term manage him as far as prednisone is concerned. Obviously there are things that you need to be watched I completely understand the risk of prednisone usage as well. 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 With all that being said I do believe that we can give him another round of the prednisone which she is requesting today because of the improvement that he seen since we did that first round. 10/30/2020 upon evaluation today patient's wound actually is showing signs of doing quite well. There does not appear to be any evidence of infection which is great news and overall very pleased with where things stand today. No fevers, chills, nausea, vomiting, or diarrhea. He tells me that the prednisone still has seem to have helped he wonders if we can extend that for just a little bit longer. He did not have the appointment with a dermatologist although he did have an infusion appointment last Friday. That was at [facility]. With that being said he tells me he could not do both that as well as the appointment with the physician on the same day therefore that is can have to be rescheduled. I really want to see if there is anything they feel like that could be done differently to try to help this out as I am not really certain that the infusions are helping significantly here. 11/13/2020 upon evaluation today patient unfortunately appears to be doing somewhat poorly in regard to his wound I feel like this is actually worsening from the standpoint of the pyoderma spreading. I still feel like that he may need something different as far as trying to manage this going forward. Again we did the prednisone unfortunately his blood sugars are not doing so well  because of this. Nonetheless I believe that the patient likely needs to try topical steroid. We have done triamcinolone for a while I think going with something stronger such as clobetasol could be beneficial again this is not something I do lightly I discussed this with the patient that again this does not normally put underneath an occlusive dressing. Nonetheless I think a thin film as such could help with some of the stronger anti-inflammatory effects. We discussed this today. He would like to try to give this a trial for the next couple weeks. I definitely think that is something that we can do. Evaluate7/03/2021 and today patient's wound bed actually showed signs of doing really about the same. There was a little expansion of the size of the wound and that leading edge that we done looking out although the clobetasol does seem to have slowed this down a bit in my opinion. There is just 1 small area that still seems to be progressing based on what I see. Nonetheless I am concerned about the fact this does not seem to be improving if anything seems to be doing a little bit worse. I do not know that the infusions are really helping him much as next infusion is August 5 his appointment with dermatology is July 25. Either way I really think that we need to have a conversation potentially about this and I am actually going to see if I can talk with Dr. Lillia Carmel in order to see where things stand as well. 12/11/2020 upon evaluation today patient appears to be doing worse in regard to his leg ulcer. Unfortunately  I just do not think this is making the progress that I would like to see at this point. Honestly he does have an appointment with dermatology and this is in 2 days. I am wondering what they may have to offer to help with this. Right now what I am seeing is that he is continuing to show signs of worsening little by little. Obviously that is not great at all. Is the exact opposite of what we are  looking for. 12/18/2020 upon evaluation today patient appears to be doing a little better in regard to his wound. The dermatologist actually did do some steroid injections into the wound which does seem to have been beneficial in my opinion. That was on the 25th already this looks a little better to me than last time I saw him. With that being said we did do a culture and this did show that he has Staph aureus noted in abundance in the wound. With that being said I do think that getting him on an oral antibiotic would be appropriate as well. Also think we can compression wrap and this will make a difference as well. 12/28/2020 upon evaluation today patient's wound is actually showing signs of doing much better. I do believe the compression wrap is helping he has a lot of drainage but to be honest I think that the compression is helping to some degree in this regard as well as not draining through which is also good news. No fevers, chills, nausea, vomiting, or diarrhea. 01/04/2021 upon evaluation today patient appears to be doing well with regard to his wound. Overall things seem to be doing quite well. He did have a little bit of reaction to the CarboFlex Sorbact he will be using that any longer. With that being said he is controlled as far as the drainage is concerned overall and seems to be doing quite well. I do not see any signs of active infection at this time which is great news. No fevers, chills, nausea, vomiting, or diarrhea. 01/11/2021 upon evaluation today patient appears to be doing well with regard to his wounds. He has been tolerating the dressing changes without complication. Fortunately there does not appear to be any signs of active infection at this time which is great news. Overall I am extremely pleased with where we stand currently. No fevers, chills, nausea, vomiting, or diarrhea. Where using clobetasol in the wound bed he has a lot of new skin growth which is awesome as  well. 01/18/2021 upon evaluation today patient appears to be doing very well in regard to his leg ulcer. He has been tolerating the dressing changes without complication. Fortunately there does not appear to be any signs of active infection which is great news. In general I think that he is making excellent progress 01/25/2021 upon evaluation today patient appears to be doing well with regard to his wound on the leg. I am actually extremely pleased with where things stand today. There does not appear to be any signs of active infection which is great news and overall I think that we are definitely headed in the appropriate direction based on what I am seeing currently. There does not appear to be any signs of active infection also excellent news. 02/06/2021 upon evaluation today patient appears to be doing well with regard to his wound. Overall visually this is showing signs of significant improvement which is great news. I do not see any signs of active infection systemically which is great even locally  I do not think that we are seeing any major complications here. We did do fluorescence imaging with the [employer] DX today. The patient does have some odor and drainage noted and again this is something that I think would benefit him to probably come more frequently for nurse visits. 02/19/2021 upon evaluation today patient actually appears to be doing quite well in regard to his wound. He has been tolerating the dressing Morre, Alvino ([id number]) changes without complication and overall I think that this is making excellent progress. I do not see any evidence of active infection at this point which is great news as well. No fevers, chills, nausea, vomiting, or diarrhea. Electronic Signature(s) Signed: 02/19/2021 9:40:46 AM By: Worthy Keeler PA-C Entered By: Worthy Keeler on 02/19/2021 09:40:46 [name], [name]  ([id number]) -------------------------------------------------------------------------------- Physical Exam Details Patient Name: [name] Date of Service: 02/19/2021 8:15 AM Medical Record Number: [id number] Patient Account Number: [id number] Date of Birth/Sex: 1980/11/27 (40 y.o. M) Treating RN: Dolan Amen Primary Care Provider: [name] Other Clinician: Referring Provider: [name] Treating Provider/Extender: [name] Cliche in Treatment: 219 Constitutional Well-nourished and well-hydrated in no acute distress. Respiratory normal breathing without difficulty. Psychiatric this patient is able to make decisions and demonstrates good insight into disease process. Alert and Oriented x 3. pleasant and cooperative. Notes Upon inspection patient's wound bed actually showed signs of good granulation epithelization at this point. The muscle was completely covered and there is a a lot of new skin growth all of which is great news. I am extremely pleased with where we stand currently. Electronic Signature(s) Signed: 02/19/2021 9:41:29 AM By: Worthy Keeler PA-C Entered By: Worthy Keeler on 02/19/2021 09:41:29 [name], [name] ([id number]) -------------------------------------------------------------------------------- Physician Orders Details Patient Name: [name] Date of Service: 02/19/2021 8:15 AM Medical Record Number: [id number] Patient Account Number: [id number] Date of Birth/Sex: [birth year] (40 y.o. 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 o Nurse Visit as needed - twice a week Bathing/ Shower/ Hygiene o Clean wound with Normal Saline or wound cleanser. o Other: - Apply dakins soaked gauze on wound after cleansing wound IN OFFICE ONLY Edema Control - Lymphedema / Segmental Compressive Device / Other o Optional: One layer of unna paste to top of compression wrap (to act as an anchor). o Elevate, Exercise Daily and Avoid Standing for Long Periods of Time. o Elevate legs to the level of the heart and pump ankles as often as possible o Elevate leg(s) parallel to the floor when sitting. Medications-Please add to medication list. o P.O. Antibiotics - Finish antibiotics Wound Treatment Wound #1 - Lower Leg Wound Laterality: Left, Lateral Cleanser: Wound Cleanser 3 x Per Week/30 Days Discharge Instructions: Wash your hands with soap and water. Remove old dressing, discard into plastic bag and place into trash. Cleanse the wound with Wound Cleanser prior to applying a clean dressing using gauze sponges, not tissues or cotton balls. Do not scrub or use excessive force. Pat dry using gauze sponges, not tissue or cotton balls. Peri-Wound Care: Desitin  Maximum Strength Ointment 4 (oz) 3 x Per Week/30 Days Discharge Instructions: Apply from 12 o'clock to 6 o'clock periwound Peri-Wound Care: Triamcinolone Acetonide Cream, 0.1%, 15 (g) tube 3 x Per Week/30 Days Discharge Instructions: Apply on posterior leg for itchiness and irritation Topical: Clobetasol Propionate ointment 0.05%, 60 (g) tube 3 x Per Week/30 Days Discharge Instructions: Apply to 7 o'clock depth and 1 o'clock depth Primary Dressing: Hydrofera Blue Ready Transfer Foam, 4x5 (in/in) 3 x Per Week/30 Days Discharge Instructions: Apply Hydrofera Blue Ready to wound  bed as directed Secondary Dressing: Xtrasorb Large 6x9 (in/in) 3 x Per Week/30 Days Discharge Instructions: Cover entire wound dressing Compression Wrap: Medichoice 4 layer Compression System, 35-40 mmHG (Generic) 3 x Per Week/30 Days Discharge Instructions: Apply multi-layer wrap as directed. [name], [name] ([id number]) Electronic Signature(s) Signed: 02/19/2021 4:40:19 PM By: Dolan Amen RN Signed: 02/19/2021 5:28:09 PM By: Worthy Keeler PA-C Entered By: Dolan Amen on 02/19/2021 08:59:39 [name], [name] ([id number]) -------------------------------------------------------------------------------- Problem List Details Patient Name: [name] Date of Service: 02/19/2021 8:15 AM Medical Record Number: [id number] Patient Account Number: [id number] Date of Birth/Sex: 1980/06/22 (40 y.o. 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 Yes L97.321 Non-pressure chronic ulcer of left ankle limited to breakdown of skin 10/08/2019 No Yes I87.2 Venous insufficiency (chronic) (peripheral) 12/04/2016 No Yes L03.116 Cellulitis of left lower limb 05/24/2019 No Yes F17.208 Nicotine dependence, unspecified, with other nicotine-induced disorders 04/24/2020 No Yes Inactive Problems ICD-10 Code Description Active Date Inactive Date L97.213 Non-pressure chronic ulcer of right calf with necrosis of muscle 04/02/2017 04/02/2017 Resolved Problems Electronic Signature(s) Signed: 02/19/2021 5:28:09 PM By: Worthy Keeler PA-C Entered By: Worthy Keeler on 02/19/2021 08:17:26 [name], [name]  ([id number]) -------------------------------------------------------------------------------- Progress Note Details Patient Name: [name] Date of Service: 02/19/2021 8:15 AM Medical Record Number: [id number] Patient Account Number: [id number] Date of Birth/Sex: 1980/12/29 (40 y.o. M) Treating RN: Dolan Amen Primary Care Provider: [name] Other Clinician: Referring Provider: [name] Treating Provider/Extender: [name] Cliche in Treatment: 219 Subjective Chief Complaint Information obtained from Patient He is here in follow up evaluation for LLE pyoderma ulcer History of Present Illness (HPI) 12/04/16; 40 year old man who comes into the clinic today for review of a wound on the posterior left calf. He tells me that is been there for about a year. He is not a diabetic he does smoke half a pack per day. He was seen in the ER on 11/20/16 felt to have cellulitis around the wound and was given clindamycin. An x-ray did not show osteomyelitis. The patient initially tells me that he has a milk allergy that sets off a pruritic itching rash on his lower legs which she scratches incessantly and he thinks that's what may have set up the wound. He has been using various topical antibiotics and ointments without any effect. He works in a [employer] and is on his feet all day. He does not have a prior history of wounds however he does have the rash on both lower legs the right arm and the ventral aspect of his left arm. These are excoriations and clearly have had scratching however there are of macular looking areas on both legs including a substantial larger area on the right leg. This  does not have an underlying open area. There is no blistering. The patient tells me that 2 years ago in [place] in response to the rash on his legs he saw a dermatologist who told him he had a condition which may be pyoderma gangrenosum although I may be putting words into his mouth. He seemed to  recognize this. On further questioning he admits to a 5 year history of quiesced. ulcerative colitis. He is not in any treatment for this. He's had no recent travel 12/11/16; the patient arrives today with his wound and roughly the same condition we've been using silver alginate this is a deep punched out wound with some surrounding erythema but no tenderness. Biopsy I did did not show confirmed pyoderma gangrenosum suggested nonspecific inflammation and vasculitis but does not provide an actual description of what was seen by the pathologist. I'm really not able to understand this We have also received information from the patient's dermatologist in [place] notes from April 2016. This was a doctor Agarwal-antal. 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 He was prescribed topical steroid high potency under occlusion which helped but at this point the patient did not have a deep punched out wound. 12/18/16; the patient's wound is larger in terms of surface area however this surface looks better and there is less depth. The surrounding erythema also is better. The patient states that the wrap we put on came off 2 days ago when he has been using his compression stockings. He we are in the process of getting a dermatology consult. 12/26/16 on evaluation today patient's left lower extremity wound shows evidence of infection with surrounding erythema noted. He has been tolerating the dressing changes but states that he has noted more discomfort. There is a larger area of erythema surrounding the wound. No fevers, chills, nausea, or vomiting noted at this time. With that being said the wound still does have slough covering the surface. He is not allergic to any medication that he is aware of at this point. In regard to his right lower extremity he had several regions that are erythematous and pruritic he wonders if there's anything we can do to help that. 01/02/17 I reviewed patient's wound culture  which was obtained his visit last week. He was placed on doxycycline at that point. Unfortunately that does not appear to be an antibiotic that would likely help with the situation however the pseudomonas noted on culture is sensitive to Cipro. Also unfortunately patient's wound seems to have a large compared to last week's evaluation. Not severely so but there are definitely increased measurements in general. He is continuing to have discomfort as well he writes this to be a seven out of 10. In fact he would prefer me not to perform any debridement today due to the fact that he is having discomfort and considering he has an active infection on the little reluctant to do so anyway. No fevers, chills, nausea, or vomiting noted at this time. 01/08/17; patient seems dermatology on September 5. I suspect dermatology will want the slides from the biopsy I did sent to their pathologist. I'm not sure if there is a way we can expedite that. In any case the culture I did before I left on vacation 3 weeks ago showed Pseudomonas he was given 10 days of Cipro and per her description of her intake nurses is actually somewhat better this week although the wound is quite a bit bigger than I remember the last time I  saw this. He still has 3 more days of Cipro 01/21/17; dermatology appointment tomorrow. He has completed the ciprofloxacin for Pseudomonas. Surface of the wound looks better however he is had some deterioration in the lesions on his right leg. Meantime the left lateral leg wound we will continue with sample 01/29/17; patient had his dermatology appointment but I can't yet see that note. He is completed his antibiotics. The wound is more superficial but considerably larger in circumferential area than when he came in. This is in his left lateral calf. He also has swollen erythematous areas with superficial wounds on the right leg and small papular areas on both arms. There apparently areas in her his upper  thighs and buttocks I did not look at those. Dermatology biopsied the right leg. Hopefully will have their input next week. 02/05/17; patient went back to see his dermatologist who told him that he had a "scratching problem" as well as staph. He is now on a 30 day course of doxycycline and I believe she gave him triamcinolone cream to the right leg areas to help with the itching [not exactly sure but probably triamcinolone]. She apparently looked at the left lateral leg wound although this was not rebiopsied and I think felt to be ultimately part of the same pathogenesis. He is using sample border foam and changing nevus himself. He now has a new open area on the right posterior leg which was his biopsy site I don't have any of the dermatology notes 02/12/17; we put the patient in compression last week with SANTYL to the wound on the left leg and the biopsy. Edema is much better and the depth of the wound is now at level of skin. Area is still the same Biopsy site on the right lateral leg we've also been using santyl with a border foam dressing and he is changing this himself. 02/19/17; Using silver alginate started last week to both the substantial left leg wound and the biopsy site on the right wound. He is tolerating compression well. Has a an appointment with his primary M.D. tomorrow wondering about diuretics although I'm wondering if the edema problem is actually lymphedema [name], [name] ([id number]) 02/26/17; the patient has been to see his primary doctor Dr. Jerrel Ivory at [facility] our primary care. She started him on Lasix 20 mg and this seems to have helped with the edema. However we are not making substantial change with the left lateral calf wound and inflammation. The biopsy site on the right leg also looks stable but not really all that different. 03/12/17; the patient has been to see vein and vascular Dr. Lucky Cowboy. He has had venous reflux studies I have not reviewed these. I did get a  call from his dermatology office. 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 They therefore recommended clobetasol and tetracycline i.e. 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 03/26/17; apparently the patient just had reflux studies not an appointment with Dr. dew. She arrives in clinic today having applied clobetasol for 2-3 weeks. He notes over the last 2-3 days excessive drainage having to change the dressing 3-4 times a day and also expanding erythema. He states the expanding erythema seems to come and go and was  last this red was earlier in the month.he is on doxycycline 150 mg twice a day as an anti-inflammatory systemic therapy for possible pyoderma gangrenosum along with the topical clobetasol 04/02/17; the patient was seen last week by Dr. Lillia Carmel at [facility] dermatology locally who kindly saw him at my request. 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 My concern was the degree of erythema medially extending from his left leg wound which was either inflammation from pyoderma or cellulitis. I put him on Augmentin however culture of the wound showed Pseudomonas which is quinolone sensitive. I really don't believe he has cellulitis however in view of everything I will continue and give him a course of Cipro. He is also on doxycycline as an immune modulator for the pyoderma. In addition to his original wound on the left lateral leg with surrounding erythema he has a wound on the right posterior calf which was an original biopsy site done by dermatology. 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 Saw Dr.  Lillia Carmel yesterday. He has been using topical antibiotics to both wound areas his original wound on the left and the biopsies/pathergy area on the right. There is definitely some improvement in the inflammation around the wound on the right although the patient states he has increasing sensitivity of the wounds. He is on prednisone 60 and doxycycline 1 as prescribed by Dr. Lillia Carmel. He is covering the topical antibiotic with gauze and putting this in his own compression stocks and changing this daily. He states that Dr. Lottie Rater did a culture of the left leg wound yesterday 05/07/17; pyoderma gangrenosum. The patient saw Dr. Lillia Carmel yesterday and has a follow-up with her in one month. He is still using topical antibiotics to both wounds although he can't recall exactly what type. He is still on prednisone 60 mg. Dr. Lillia Carmel stated that the doxycycline could stop if we were in agreement. 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 The right medial leg was induced by biopsy/pathergy. The area on the right is essentially healed. 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 The right medial leg has closed and remains closed. He is still on prednisone 60. He tells me he missed his last dermatology appointment with Dr. Lillia Carmel but will make another appointment. He reports that her blood sugar at a recent screen in [place] was high 200's. He was 180 today. 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 I think he is going to require still much longer prednisone perhaps another 3 months before attempting to taper. In the meantime his wound is a lot better. Smaller. He is cleaning this off daily and applying topical antibiotics. When he was last in the clinic I thought about changing to [facility] and  actually put in a couple of calls to dermatology although probably not during their business hours. 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 He continues on oral prednisone. He has been using triple antibiotic ointment. There is surface debris and we will transition to [facility] and have him return in 2 weeks. He has lost 30 pounds since his last appointment with  lifestyle modification. He may benefit from topical steroid cream for treatment this can be considered at a later date. 08/22/17 on evaluation today patient appears to actually be doing rather well in regard to his left lateral lower extremity ulcer. He has actually been managed by Dr. Dellia Nims most recently. Patient is currently on oral steroids at this time. This seems to have been of benefit for him. Nonetheless his last visit was actually with Leah on 08/06/17. Currently he is not utilizing any topical steroid creams although this could be of benefit as well. No fevers, chills, nausea, or vomiting noted at this time. 09/05/17 on evaluation today patient appears to be doing better in regard to his left lateral lower extremity ulcer. He has been tolerating the dressing changes without complication. He is using Santyl with good effect. Overall I'm very pleased with how things are standing at this point. Patient likewise is happy that this is doing better. 09/19/17 on evaluation today patient actually appears to be doing rather well in regard to his left lateral lower extremity ulcer. 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 He's not having any significant pain. 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 He has been tolerating the  Santyl without complication and in general I feel like he's making good progress. 10/17/17 on evaluation today patient appears to be doing very well in regard to his left lateral lower surety ulcer. He has been tolerating the dressing changes without complication. There does not appear to be any evidence of infection he's alternating the Santyl and the triple antibiotic ointment every other day this seems to be doing well for him. 11/03/17 on evaluation today patient appears to be doing very well in regard to his left lateral lower extremity ulcer. He is been tolerating the dressing changes without complication which is good news. Fortunately there does not appear to be any evidence of infection which is also great news. Overall is doing excellent they are starting to taper down on the prednisone is down to 40 mg at this point it also started topical clobetasol for him. 11/17/17 on evaluation today patient appears to be doing well in regard to his left lateral lower surety ulcer. He's been tolerating the dressing changes without complication. He does note that he is having no pain, no excessive drainage or discharge, and overall he feels like things are going about how he would expect and hope they would. Overall he seems to have no evidence of infection at this time in my opinion which is good news. 12/04/17-He is seen in follow-up evaluation for right lateral lower extremity ulcer. He has been applying topical steroid cream. Today's measurement show slight increase in size. Over the next 2 weeks we will transition to every other day Santyl and steroid cream. He has been encouraged to monitor for changes and notify clinic with any concerns 12/15/17 on evaluation today patient's left lateral motion the ulcer and fortunately is doing worse again at this point. This just since last week to this week has close to doubled in size according to the patient. I did not seeing last week's I do not have a visual to  compare this to in our system was also down so we do not have all the charts and at this point. Nonetheless it does have me somewhat concerned  in regard to the fact that again he was worried enough about it he has contact the dermatology that placed them back on the full strength, 50 mg a day of the prednisone that he was taken previous. He continues to alternate using clobetasol along with Santyl at this point. He is obviously somewhat frustrated. SAMI, FROH ([id number]) 12/22/17 on evaluation today patient appears to be doing a little worse compared to last evaluation. Unfortunately the wound is a little deeper and slightly larger than the last week's evaluation. With that being said he has made some progress in regard to the irritation surrounding at this time unfortunately despite that progress that's been made he still has a significant issue going on here. 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 For that reason I discussed with him today the possibility of perform a wound culture to ensure there's nothing overtly infected. 01/06/18 on evaluation today patient's wound is larger and deeper than previously evaluated. With that being said it did appear that his wound was infected after my last evaluation with him. Subsequently I did end up prescribing a prescription for Bactrim DS which she has been taking and having no complication with. Fortunately there does not appear to be any evidence of infection at this point in time as far as anything spreading, no want to touch, and overall I feel like things are showing signs of improvement. 01/13/18 on evaluation today patient appears to be even a little larger and deeper than last time. There still muscle exposed in the base of the wound. Nonetheless he does appear to be less erythematous I do believe  inflammation is calming down also believe the infection looks like it's probably resolved at this time based on what I'm seeing. No fevers, chills, nausea, or vomiting noted at this time. 01/30/18 on evaluation today patient actually appears to visually look better for the most part. Unfortunately those visually this looks better he does seem to potentially have what may be an abscess in the muscle that has been noted in the central portion of the wound. This is the first time that I have noted what appears to be fluctuance in the central portion of the muscle. With that being said I'm somewhat more concerned about the fact that this might indicate an abscess formation at this location. I do believe that an ultrasound would be appropriate. This is likely something we need to try to do as soon as possible. He has been switch to mupirocin ointment and he is no longer using the steroid ointment as prescribed by dermatology he sees them again next week he's been decreased from 60 to 40 mg of prednisone. 03/09/18 on evaluation today patient actually appears to be doing a little better compared to last time I saw him. There's not as much erythema surrounding the wound itself. He I did review his most recent infectious disease note which was dated 02/24/18. He saw Dr. Michel Bickers in [place]. 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 Patient is now off of antibiotics and they are continually observing this. There seems to be no change in the past two weeks in my pinion based on what the patient says and what I see today compared to what Dr. Megan Salon likely saw two weeks  ago. No fevers, chills, nausea, or vomiting noted at this time. 03/23/18 on evaluation today patient's wound actually appears to be showing signs of improvement which is good news. He is currently still on the Dapsone. He is also working on tapering the  prednisone to get off of this and Dr. Lottie Rater is working with him in this regard. Nonetheless overall I feel like the wound is doing well it does appear based on the infectious disease note that I reviewed from Dr. 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 I did also review the results of his ultrasound of left lower extremity which revealed there was a dentist tissue in the base of the wound without an abscess noted. 04/06/18 on evaluation today the patient's left lateral lower extremity ulcer actually appears to be doing fairly well which is excellent news. There does not appear to be any evidence of infection at this time which is also great news. Overall he still does have a significantly large ulceration although little by little he seems to be making progress. He is down to 10 mg a day of the prednisone. 04/20/18 on evaluation today patient actually appears to be doing excellent at this time in regard to his left lower extremity ulcer. He's making signs of good progress unfortunately this is taking much longer than we would really like to see but nonetheless he is making progress. Fortunately there does not appear to be any evidence of infection at this time. No fevers, chills, nausea, or vomiting noted at this time. The patient has not been using the Santyl due to the cost he hadn't got in this field yet. He's mainly been using the antibiotic ointment topically. Subsequently he also tells me that he really has not been scrubbing in the shower I think this would be helpful again as I told him it doesn't have to be anything too aggressive to even make it believe just enough to keep it free of some of the loose slough and biofilm on the wound surface. 05/11/18 on evaluation today patient's wound appears to be making slow but sure progress in regard to the left lateral lower extremity  ulcer. He is been tolerating the dressing changes without complication. Fortunately there does not appear to be any evidence of infection at this time. He is still just using triple antibiotic ointment along with clobetasol occasionally over the area. He never got the Santyl and really does not seem to intend to in my pinion. 06/01/18 on evaluation today patient appears to be doing a little better in regard to his left lateral lower extremity ulcer. He states that overall he does not feel like he is doing as well with the Dapsone as he did with the prednisone. Nonetheless he sees his dermatologist later today and is gonna talk to them about the possibility of going back on the prednisone. Overall again I believe that the wound would be better if you would utilize Santyl but he really does not seem to be interested in going back to the [place] at this point. He has been using triple antibiotic ointment. 06/15/18 on evaluation today patient's wound actually appears to be doing about the same at this point. Fortunately there is no signs of infection at this time. He has made slight improvements although he continues to not really want to clean the wound bed at this point. He states  that he just doesn't mess with it he doesn't want to cause any problems with everything else he has going on. He has been on medication, antibiotics as prescribed by his dermatologist, for a staff infection of his lower extremities which is really drying out now and looking much better he tells me. Fortunately there is no sign of overall infection. 06/29/18 on evaluation today patient appears to be doing well in regard to his left lateral lower surety ulcer all things considering. Fortunately his staff infection seems to be greatly improved compared to previous. He has no signs of infection and this is drying up quite nicely. He is still the doxycycline for this is no longer on cental, Dapsone, or any of the other medications. His  dermatologist has recommended possibility of an infusion but right now he does not want to proceed with that. 07/13/18 on evaluation today patient appears to be doing about the same in regard to his left lateral lower surety ulcer. Fortunately there's no signs of infection at this time which is great news. Unfortunately he still builds up a significant amount of Slough/biofilm of the surface of the wound he still is not really cleaning this as he should be appropriately. Again I'm able to easily with saline and gauze remove the majority of this on the surface which if you would do this at home would likely be a dramatic improvement for him as far as getting the area to improve. Nonetheless overall I still feel like he is making progress is just very slow. I think Santyl will be of benefit for him as well. Still he has not gotten this as of this point. 07/27/18 on evaluation today patient actually appears to be doing little worse in regards of the erythema around the periwound region of the wound he also tells me that he's been having more drainage currently compared to what he was experiencing last time I saw him. He states not quite as bad as what he had because this was infected previously but nonetheless is still appears to be doing poorly. Fortunately there is no evidence of systemic infection at this point. The patient tells me that he is not going to be able to afford the Santyl. He is still waiting to hear about the infusion therapy with his dermatologist. Apparently she wants an updated colonoscopy first. JAHSIR, RAMA ([id number]) 08/10/18 on evaluation today patient appears to be doing better in regard to his left lateral lower extremity ulcer. Fortunately he is showing signs of improvement in this regard he's actually been approved for Remicade infusion's as well although this has not been scheduled as of yet. Fortunately there's no signs of active infection at this time in regard to the wound  although he is having some issues with infection of the right lower extremity is been seen as dermatologist for this. Fortunately they are definitely still working with him trying to keep things under control. 09/07/18 on evaluation today patient is actually doing rather well in regard to his left lateral lower extremity ulcer. He notes these actually having some hair grow back on his extremity which is something he has not seen in years. He also tells me that the pain is really not giving them any trouble at this time which is also good news overall she is very pleased with the progress he's using a combination of the mupirocin along with the probate is all mixed. 09/21/18 on evaluation today patient actually appears to be doing fairly well all things  considered in regard to his looks from the ulcer. He's been tolerating the dressing changes without complication. Fortunately there's no signs of active infection at this time which is good news he is still on all antibiotics or prevention of the staff infection. He has been on prednisone for time although he states it is gonna contact his dermatologist and see if she put them on a short course due to some irritation that he has going on currently. Fortunately there's no evidence of any overall worsening this is going very slow I think cental would be something that would be helpful for him although he states that $50 for tube is quite expensive. He therefore is not willing to get that at this point. 10/06/18 on evaluation today patient actually appears to be doing decently well in regard to his left lateral leg ulcer. He's been tolerating the dressing changes without complication. Fortunately there's no signs of active infection at this time. Overall I'm actually rather pleased with the progress he's making although it's slow he doesn't show any signs of infection and he does seem to be making some improvement. I do believe that he may need a switch up and  dressings to try to help this to heal more appropriately and quickly. 10/19/18 on evaluation today patient actually appears to be doing better in regard to his left lateral lower extremity ulcer. This is shown signs of having much less Slough buildup at this point due to the fact he has been using the [employer]. Obviously this is very good news. The overall size of the wound is not dramatically smaller but again the appearance is. 11/02/18 on evaluation today patient actually appears to be doing quite well in regard to his lower [place] ulcer. A lot of the skin around the ulcer is actually somewhat irritating at this point this seems to be more due to the dressing causing irritation from the adhesive that anything else. Fortunately there is no signs of active infection at this time. 11/24/18 on evaluation today patient appears to be doing a little worse in regard to his overall appearance of his lower extremity ulcer. There's more erythema and warmth around the wound unfortunately. He is currently on doxycycline which he has been on for some time. With that being said I'm not sure that seems to be helping with what appears to possibly be an acute cellulitis with regard to his left lower extremity ulcer. No fevers, chills, nausea, or vomiting noted at this time. 12/08/18 on evaluation today patient's wounds actually appears to be doing significantly better compared to his last evaluation. He has been using Santyl along with alternating tripling about appointment as well as the steroid cream seems to be doing quite well and the wound is showing signs of improvement which is excellent news. Fortunately there's no evidence of infection and in fact his culture came back negative with only normal skin flora noted. 12/21/2018 upon evaluation today patient actually appears to be doing excellent with regard to his ulcer. This is actually the best that I have seen it since have been helping to take care of him. It is  both smaller as well as less slough noted on the surface of the wound and seems to be showing signs of good improvement with new skin growing from the edges. He has been using just the triamcinolone he does wonder if he can get a refill of that ointment today. 01/04/2019 upon evaluation today patient actually appears to be doing well with regard  to his left lateral lower extremity ulcer. With that being said it does not appear to be that he is doing quite as well as last time as far as progression is concerned. There does not appear to be any signs of infection or significant irritation which is good news. With that being said I do believe that he may benefit from switching to a collagen based dressing based on how clean The wound appears. 01/18/2019 on evaluation today patient actually appears to be doing well with regard to his wound on the left lower extremity. He is not made a lot of progress compared to where we were previous but nonetheless does seem to be doing okay at this time which is good news. There is no signs of active infection which is also good news. My only concern currently is I do wish we can get him into utilizing the collagen dressing his insurance would not pay for the supplies that we ordered although it appears that he may be able to order this through his supply company that he typically utilizes. This is [employer]. Nonetheless he did try to order it during the office visit today and it appears this did go through. We will see if he can get that it is a different brand but nonetheless he has collagen and I do think will be beneficial. 02/01/2019 on evaluation today patient actually appears to be doing a little worse today in regard to the overall size of his wounds. Fortunately there is no signs of active infection at this time. That is visually. Nonetheless when this is happened before it was due to infection. For that reason were somewhat concerned about that this time as  well. 02/08/2019 on evaluation today patient unfortunately appears to be doing slightly worse with regard to his wound upon evaluation today. Is measuring a little deeper and a little larger unfortunately. I am not really sure exactly what is causing this to enlarge he actually did see his dermatologist she is going to see about initiating Humira for him. Subsequently she also did do steroid injections into the wound itself in the periphery. Nonetheless still nonetheless he seems to be getting a little bit larger he is gone back to just using the steroid cream topically which I think is appropriate. I would say hold off on the collagen for the time being is definitely a good thing to do. Based on the culture results which we finally did get the final result back regarding it shows staph as the bacteria noted again that can be a normal skin bacteria based on the fact however he is having increased drainage and worsening of the wound measurement wise I would go ahead and place him on an antibiotic today I do believe for this. 02/15/2019 on evaluation today patient actually appears to be doing somewhat better in regard to his ulcer. There is no signs of worsening at this time I did review his culture results which showed evidence of Staphylococcus aureus but not MRSA. Again this could just be more related to the normal skin bacteria although he states the drainage has slowed down quite a bit he may have had a mild infection not just colonization. And was much smaller and then since around10/04/2019 on evaluation today patient appears to be doing unfortunately worse as far as the size of the wound. I really feel like that this is steadily getting larger again it had been doing excellent right at the beginning of September we have seen a  steady increase in the area of the wound it is almost 2-1/2 times the size it was on September 1. Obviously this is a bad trend this is not wanting to see. For that reason  we went back to using just the topical triamcinolone cream which does seem to help with inflammation. I checked him for bacteria by way of culture and nothing showed positive there. I am considering giving him a short course of a tapering steroid [name] [name], Obadiah ([id number]) today to see if that is can be beneficial for him. The patient is in agreement with giving that a try. 03/08/2019 on evaluation today patient appears to be doing very well in comparison to last evaluation with regard to his lower extremity ulcer. This is showing signs of less inflammation and actually measuring slightly smaller compared to last time every other week over the past month and a half he has been measuring larger larger larger. Nonetheless I do believe that the issue has been inflammation the prednisone does seem to have been beneficial for him which is good news. No fevers, chills, nausea, vomiting, or diarrhea. 03/22/2019 on evaluation today patient appears to be doing about the same with regard to his leg ulcer. He has been tolerating the dressing changes without complication. With that being said the wound seems to be mostly arrested at its current size but really is not making any progress except for when we prescribed the prednisone. He did show some signs of dropping as far as the overall size of the wound during that interval week. Nonetheless this is something he is not on long-term at this point and unfortunately I think he is getting need either this or else the Humira which his dermatologist has discussed try to get approval for. With that being said he will be seeing his dermatologist on the 11th of this month that is November. 04/19/2019 on evaluation today patient appears to be doing really about the same the wound is measuring slightly larger compared to last time I saw him. He has not been into the office since November 2 due to the fact that he unfortunately had Covid as that his entire family. He  tells me that it was rough but they did pull-through and he seems to be doing much better. Fortunately there is no signs of active infection at this time. No fevers, chills, nausea, vomiting, or diarrhea. 05/10/2019 on evaluation today patient unfortunately appears to be doing significantly worse as compared to last time I saw him. He does tell me that he has had his first dose of Humira and actually is scheduled to get the next one in the upcoming week. With that being said he tells me also that in the past several days he has been having a lot of issues with green drainage she showed me a picture this is more blue-green in color. He is also been having issues with increased sloughy buildup and the wound does appear to be larger today. Obviously this is not the direction that we want everything to take based on the starting of his Humira. Nonetheless I think this is definitely a result of likely infection and to be honest I think this is probably Pseudomonas causing the infection based on what I am seeing. 05/24/2019 on evaluation today patient unfortunately appears to be doing significantly worse compared to his prior evaluation with me 2 weeks ago. I did review his culture results which showed that he does have Staph aureus as well as Pseudomonas  noted on the culture. Nonetheless the Levaquin that I prescribed for him does not appear to have been appropriate and in fact he tells me he is no longer experiencing the green drainage and discharge that he had at the last visit. Fortunately there is no signs of active infection at this time which is good news although the wound has significantly worsened it in fact is much deeper than it was previous. We have been utilizing up to this point triamcinolone ointment as the prescription topical of choice but at this time I really feel like that the wound is getting need to be packed in order to appropriately manage this due to the deeper nature of the wound.  Therefore something along the lines of an alginate dressing may be more appropriate. 05/31/2019 upon inspection today patient's wound actually showed signs of doing poorly at this point. Unfortunately he just does not seem to be making any good progress despite what we have tried. He actually did go ahead and pick up the Cipro and start taking that as he was noticing more green drainage he had previously completed the Levaquin that I prescribed for him as well. Nonetheless he missed his appointment for the seventh last week on Wednesday with the wound care center and [facility] where his dermatologist referred him. Obviously I do think a second opinion would be helpful at this point especially in light of the fact that the patient seems to be doing so poorly despite the fact that we have tried everything that I really know how at this point. The only thing that ever seems to have helped him in the past is when he was on high doses of continual steroids that did seem to make a difference for him. 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 06/07/2019 upon evaluation today patient unfortunately appears to be doing worse yet again with regard to his wound. In fact I am starting to question whether or not he may have a fluid pocket in the muscle at this point based on the bulging and the soft appearance to the central portion of the muscle area. There is not anything draining from the muscle itself at this time which is good news but nonetheless the wound is expanding. I am not really seeing any results of the Humira as far as overall wound progression based on what I am seeing at this point. The patient has been referred for second opinion with regard to his wound to the [facility] wound care center by his dermatologist which I definitely am not  in opposition to. Unfortunately we tried multiple dressings in the past including collagen, alginate, and at one point even Hydrofera Blue. With that being said he is never really used it for any significant amount of time due to the fact that he often complains of pain associated with these dressings and then will go back to either using the Santyl which she has done intermittently or more frequently the triamcinolone. He is also using his own compression stockings. We have wrapped him in the past but again that was something else that he really was not a big fan of. 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 He seemed to do much better when he was on the steroids although honestly I understand that the steroids are not necessarily the best medication to be on long-term obviously 06/14/2019 on evaluation today patient appears to be doing actually a little bit better with regard to the overall appearance with his leg. Unfortunately he does continue to have issues with what appears to be some fluid underneath the muscle although he did see the wound specialty center at [facility] last week their main goals were to see about infusion therapy in place of the Humira as they feel like that is not quite strong enough. They also recommended that we continue with the treatment otherwise as we are they felt like that was appropriate and they are okay with him continuing to follow-up here with [place] in that regard. With that being said they are also sending him to the vein specialist there to see about vein stripping and if that would be of benefit for him. Subsequently they also did not really address  whether or not an ultrasound of the muscle area to see if there is anything that needs to be addressed here would be appropriate or not. For that reason I discussed this with him last week I think we may proceed down that road at this point. 06/21/2019 upon evaluation today patient's wound actually appears to be doing slightly better compared to previous evaluations. I do believe that he has made a difference with regard to the progression here with the use of oral steroids. Again in the past has been the only thing that is really calm things down. He does tell me that from [facility] is gotten a good news from there that there are no further vein stripping that is necessary at this point. I do not have that available for review today although the patient did relay this to me. He also did obtain and have the ultrasound of the wound completed which I did sign off on today. It does appear that there is no fluid collection under the muscle this is likely then just edematous tissue in general. That is also good news. Overall I still believe the inflammation is the main issue here. He did inquire about the possibility of a wound VAC again with the muscle protruding like it is I am not really sure whether the wound VAC is necessarily ideal or not. That is something we will have to consider although I do believe he may need compression wrapping to try to help with edema control which could potentially be of benefit. 06/28/2019 on evaluation today patient appears to be doing slightly better measurement wise although this is not terribly smaller he least seems to be trending towards that direction. With that being said he still seems to have purulent drainage noted in the wound bed at this time. He has been on Levaquin followed by Cipro over the past month. Unfortunately he still seems to have some issues with active infection at this time. I did perform a culture last week in order to evaluate and see if indeed there  was still anything going on. Subsequently the culture did come back Smith, Khaleel ([id number]) showing Pseudomonas which is consistent with the drainage has been having which is blue-green in color.  He also has had an odor that again was somewhat consistent with Pseudomonas as well. Long story short it appears that the culture showed an intermediate finding with regard to how well the Cipro will work for the Pseudomonas infection. Subsequently being that he does not seem to be clearing up and at best what we are doing is just keeping this at [place] I think he may need to see infectious disease to discuss IV antibiotic options. 07/05/2019 upon evaluation today patient appears to be doing okay in regard to his leg ulcer. He has been tolerating the dressing changes at this point without complication. Fortunately there is no signs of active infection at this time which is good news. No fevers, chills, nausea, vomiting, or diarrhea. With that being said he does have an appointment with infectious disease tomorrow and his primary care on Wednesday. Again the reason for the infectious disease referral was due to the fact that he did not seem to be fully resolving with the use of oral antibiotics and therefore we were thinking that IV antibiotic therapy may be necessary secondary to the fact that there was an intermediate finding for how effective the Cipro may be. Nonetheless again he has been having a lot of purulent and even green drainage. Fortunately right now that seems to have calmed down over the past week with the reinitiation of the oral antibiotic. Nonetheless we will see what Dr. Megan Salon has to say. 07/12/2019 upon evaluation today patient appears to be doing about the same at this point in regard to his left lower extremity ulcer. Fortunately there is no signs of active infection at this time which is good news I do believe the Levaquin has been beneficial I did review Dr. Hale Bogus note and to be  honest I agree that the patient's leg does appear to be doing better currently. What we found in the past as he does not seem to really completely resolve he will stop the antibiotic and then subsequently things will revert back to having issues with blue-green drainage, increased pain, and overall worsening in general. Obviously that is the reason I sent him back to infectious disease. 07/19/2019 upon evaluation today patient appears to be doing roughly the same in size there is really no dramatic improvement. He has started back on the Levaquin at this point and though he seems to be doing okay he did still have a lot of blue/green drainage noted on evaluation today unfortunately. I think that this is still indicative more likely of a Pseudomonas infection as previously noted and again he does see Dr. Megan Salon in just a couple of days. I do not know that were really able to effectively clear this with just oral antibiotics alone based on what I am seeing currently. Nonetheless we are still continue to try to manage as best we can with regard to the patient and his wound. I do think the wrap was helpful in decreasing the edema which is excellent news. No fevers, chills, nausea, vomiting, or diarrhea. 07/26/2019 upon evaluation today patient appears to be doing slightly better with regard to the overall appearance of the muscle there is no dark discoloration centrally. Fortunately there is no signs of active infection at this time. No fevers, chills, nausea, vomiting, or diarrhea. Patient's wound bed currently the patient did have an appointment with Dr. Megan Salon at infectious disease last week. With that being said Dr. Megan Salon the patient states was still somewhat hesitant about put him on any IV antibiotics  he wanted [place] to repeat cultures today and then see where things go going forward. He does look like Dr. Megan Salon because of some improvement the patient did have with the Levaquin wanted [place] to see  about repeating cultures. If it indeed grows the Pseudomonas again then he recommended a possibility of considering a PICC line placement and IV antibiotic therapy. He plans to see the patient back in 1 to 2 weeks. 08/02/2019 upon evaluation today patient appears to be doing poorly with regard to his left lower extremity. We did get the results of his culture back it shows that he is still showing evidence of Pseudomonas which is consistent with the purulent/blue-green drainage that he has currently. Subsequently the culture also shows that he now is showing resistance to the oral fluoroquinolones which is unfortunate as that was really the only thing to treat the infection prior. I do believe that he is looking like this is going require IV antibiotic therapy to get this under control. Fortunately there is no signs of systemic infection at this time which is good news. The patient does see Dr. Megan Salon tomorrow. 08/09/2019 upon evaluation today patient appears to be doing better with regard to his left lower extremity ulcer in regard to the overall appearance. He is currently on IV antibiotic therapy. As ordered by Dr. Megan Salon. Currently the patient is on ceftazidime which she is going to take for the next 2 weeks and then follow-up for 4 to 5-week appointment with Dr. Megan Salon. The patient started this this past Friday symptoms have not for a total of 3 days currently in full. 08/16/2019 upon evaluation today patient's wound actually does show muscle in the base of the wound but in general does appear to be much better as far as the overall evidence of infection is concerned. In fact I feel like this is for the most part cleared up he still on the IV antibiotics he has not completed the full course yet but I think he is doing much better which is excellent news. 08/23/2019 upon evaluation today patient appears to be doing about the same with regard to his wound at this point. He tells me that he still  has pain unfortunately. Fortunately there is no evidence of systemic infection at this time which is great news. There is significant muscle protrusion. 09/13/19 upon evaluation today patient appears to be doing about the same in regard to his leg unfortunately. He still has a lot of drainage coming from the ulceration there is still muscle exposed. With that being said the patient's last wound culture still showed an intermediate finding with regard to the Pseudomonas he still having the bluish/green drainage as well. Overall I do not know that the wound has completely cleared of infection at this point. Fortunately there is no signs of active infection systemically at this point which is good news. 09/20/2019 upon evaluation today patient's wound actually appears to be doing about the same based on what I am seeing currently. I do not see any signs of systemic infection he still does have evidence of some local infection and drainage. He did see Dr. Megan Salon last week and Dr. Megan Salon states that he probably does need a different IV antibiotic although he does not want to put him on this until the patient begins the Remicade infusion which is actually scheduled for about 10 days out from today on 13 May. Following that time Dr. Megan Salon is good to see him back and then will evaluate  the feasibility of starting him on the IV antibiotic therapy once again at that point. I do not disagree with this plan I do believe as Dr. Megan Salon stated in his note that I reviewed today that the patient's issue is multifactorial with the pyoderma being 1 aspect of this that were hoping the Remicade will be helpful for her. In the meantime I think the gentamicin is, helping to keep things under decent okay control in regard to the ulcer. 09/27/2019 upon evaluation today patient appears to be doing about the same with regard to his wound still there is a lot of muscle exposure though he does have some hyper granulation  tissue noted around the edge and actually some granulation tissue starting to form over the muscle which is actually good news. Fortunately there is no evidence of active infection which is also good news. His pain is less at this point. 5/21; this is a patient I have not seen in a long time. 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 He has a large wound on the left lateral leg with protruding muscle. He comes in the clinic today showing the same area on his left medial ankle. He says there is been a spot there for some time although we have not previously defined this. Today he has a clearly defined area with slight amount of skin breakdown surrounded by raised areas with a purplish hue in color. This is not painful he says it is irritated. 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 He still has muscle protruding from the lateral portion of his left leg but fortunately the new area that they were concerned about at his last visit does not appear to have opened at this point. He is currently on Remicade infusions and seems to be doing better in my opinion in fact the wound itself seems to be overall much better. The purplish discoloration that he did have seems to have resolved and I think that is a good sign that hopefully the Remicade is doing its job. He does Cryder, Gahel ([id number]) have some biofilm noted over the surface of the wound. 11/01/2019 on evaluation today patient's wound actually appears to be doing excellent at this time. Fortunately there is no evidence of active infection and overall I feel like he is making great progress. The Remicade seems to be due excellent job in my opinion. 11/08/19 evaluation today vision actually appears to be doing quite well with regard to his weight ulcer. He's been tolerating dressing changes without  complication. Fortunately there is no evidence of infection. No fevers, chills, nausea, or vomiting noted at this time. Overall states that is having more itching than pain which is actually a good sign in my opinion. 12/13/2019 upon evaluation today patient appears to be doing well today with regard to his wound. He has been tolerating the dressing changes without complication. Fortunately there is no sign of active infection at this time. No fevers, chills, nausea, vomiting, or diarrhea. Overall I feel like the infusion therapy has been very beneficial for him. 01/06/2020 on evaluation today patient appears to be doing well with regard to his wound. This is measuring smaller and actually looks to be doing better. Fortunately there is no signs of active infection at this point. No fevers, chills, nausea, vomiting, or diarrhea. With that being said he does still have the blue-green drainage but this  does not seem to be causing any significant issues currently. He has been using the gentamicin that does seem to be keeping things under decent control at this point. He goes later this morning for his next infusion therapy for the pyoderma which seems to also be very beneficial. 02/07/2020 on evaluation today patient appears to be doing about the same in regard to his wounds currently. Fortunately there is no signs of active infection systemically he does still have evidence of local infection still using gentamicin. He also is showing some signs of improvement albeit slowly I do feel like we are making some progress here. 02/21/2020 upon evaluation today patient appears to be making some signs of improvement the wound is measuring a little bit smaller which is great news and overall I am very pleased with where he stands currently. He is going to be having infusion therapy treatment on the 15th of this month. Fortunately there is no signs of active infection at this time. 03/13/2020 I do believe patient's  wound is actually showing some signs of improvement here which is great news. He has continue with the infusion therapy through rheumatology/dermatology at [facility]. That does seem to be beneficial. I still think he gets as much benefit from this as he did from the prednisone initially but nonetheless obviously this is less harsh on his body that the prednisone as far as they are concerned. 03/31/2020 on evaluation today patient's wound actually showing signs of some pretty good improvement in regard to the overall appearance of the wound bed. There is still muscle exposed though he does have some epithelial growth around the edges of the wound. Fortunately there is no signs of active infection at this time. No fevers, chills, nausea, vomiting, or diarrhea. 04/24/2020 upon evaluation today patient appears to be doing about the same in regard to his leg ulcer. He has been tolerating the dressing changes without complication. Fortunately there is no signs of active infection at this time. No fevers, chills, nausea, vomiting, or diarrhea. With that being said he still has a lot of irritation from the bandaging around the edges of the wound. We did discuss today the possibility of a referral to plastic surgery. 05/22/2020 on evaluation today patient appears to be doing well with regard to his wounds all things considered. He has not been able to get the Chantix apparently there is a recall nurse that I was unaware of put out by [employer] involuntarily. Nonetheless for now I am and I have to do some research into what may be the best option for him to help with quitting in regard to smoking and we discussed that today. 06/26/2020 upon evaluation today patient appears to be doing well with regard to his wound from the standpoint of infection I do not see any signs of infection at this point. With that being said unfortunately he is still continuing to have issues with muscle exposure and again he is not having a whole  lot of new skin growth unfortunately. There does not appear to be any signs of active infection at this time. No fevers, chills, nausea, vomiting, or diarrhea. 07/10/2020 upon evaluation today patient appears to be doing a little bit more poorly currently compared to where he was previous. I am concerned currently about an active infection that may be getting worse especially in light of the increased size and tenderness of the wound bed. No fevers, chills, nausea, vomiting, or diarrhea. 07/24/2020 upon evaluation today patient appears to be doing  poorly in regard to his leg ulcer. He has been tolerating the dressing changes without complication but unfortunately is having a lot of discomfort. 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 Subsequently I think he is going require possibly IV antibiotics to get this under control. I am very concerned about the severity of his infection and the amount of discomfort he is having. 07/31/2020 upon evaluation today patient appears to be doing about the same in regard to his leg wound. He did see Dr. Megan Salon and Dr. Megan Salon is actually going to start him on IV antibiotics. He goes for the PICC line tomorrow. With that being said there do not have that run for 2 weeks and then see how things are doing and depending on how he is progressing they may extend that a little longer. Nonetheless I am glad this is getting ready to be in place and definitely feel it may help the patient. In the meantime is been using mainly triamcinolone to the wound bed has an anti-inflammatory. 08/07/2020 on evaluation today patient appears to be doing well with regard to his wound compared even last week. In the interim he has gotten the PICC line placed and overall this seems to be doing excellent. There does not appear to be any evidence of infection which is great news systemically although locally of course has had  the infection this appears to be improving with the use of the antibiotics. 08/14/2020 upon evaluation today patient's wound actually showing signs of excellent improvement. Overall the irritation has significantly improved the drainage is back down to more of a normal level and his pain is really pretty much nonexistent compared to what it was. Obviously I think that this is significantly improved secondary to the IV antibiotic therapy which has made all the difference in the world. Again he had a resistant form of Pseudomonas for which oral antibiotics just was not cutting it. Nonetheless I do think that still we need to consider the possibility of a surgical closure for this wound is been open so long and to be honest with muscle exposed I think this can be very hard to get this to close outside of this although definitely were still working to try to do what we can in that regard. 08/21/2020 upon evaluation today patient appears to be doing very well with regard to his wounds on the left lateral lower extremity/calf area. Fortunately there does not appear to be signs of active infection which is great news and overall very pleased with where things stand today. He is actually wrapping up his treatment with IV antibiotics tomorrow. After that we will see where things go from there. 08/28/2020 upon evaluation today patient appears to be doing decently well with regard to his leg ulcer. There does not appear to be any signs of Witucki, Kweku ([id number]) active infection which is great news and overall very pleased with where things stand today. No fevers, chills, nausea, vomiting, or diarrhea. 09/18/2020 upon evaluation today patient appears to be doing well with regard to his infection which I feel like is better. Unfortunately he is not doing as well with regard to the overall size of the wound which is not nearly as good at this point. I feel like that he may be having an issue here with the pyoderma  being somewhat out of control. I think that he may benefit from potentially going back and talking to the dermatologist about what  to do from the pyoderma standpoint. I am not certain if the infusions are helping nearly as much is what the prednisone did in the past. 10/02/2020 upon evaluation today patient appears to be doing well with regard to his leg ulcer. He did go to the [facility]iatric nurse. Unfortunately they feel like there is a 10% chance that most that he would be able to heal and that the skin graft would take. Obviously this has led him to not be able to go down that path as far as treatment is concerned. Nonetheless he does seem to be doing a little bit better with the prednisone that I gave him last time. I think that he may need to discuss with dermatology the possibility of long-term prednisone as that seems to be what is most helpful for him to be perfectly honest. I am not sure the Remicade is really doing the job. 10/17/2020 upon evaluation today patient appears to be doing a little better in regard to his wound. In fact the case has been since we did the prednisone on May 2 for him that we have noticed a little bit of improvement each time we have seen a size wise as well as appearance wise as well as pain wise. I think the prednisone has had a greater effect then the infusion therapy has to be perfectly honest. With that being said the patient also feels significantly better compared to what he was previous. All of this is good news but nonetheless I am still concerned about the fact that again we are really not set up to long-term manage him as far as prednisone is concerned. Obviously there are things that you need to be watched I completely understand the risk of prednisone usage as well. 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 With all that being said I do believe that we can give him another round of the prednisone which she is  requesting today because of the improvement that he seen since we did that first round. 10/30/2020 upon evaluation today patient's wound actually is showing signs of doing quite well. There does not appear to be any evidence of infection which is great news and overall very pleased with where things stand today. No fevers, chills, nausea, vomiting, or diarrhea. He tells me that the prednisone still has seem to have helped he wonders if we can extend that for just a little bit longer. He did not have the appointment with a dermatologist although he did have an infusion appointment last Friday. That was at [facility]. With that being said he tells me he could not do both that as well as the appointment with the physician on the same day therefore that is can have to be rescheduled. I really want to see if there is anything they feel like that could be done differently to try to help this out as I am not really certain that the infusions are helping significantly here. 11/13/2020 upon evaluation today patient unfortunately appears to be doing somewhat poorly in regard to his wound I feel like this is actually worsening from the standpoint of the pyoderma spreading. I still feel like that he may need something different as far as trying to manage this going forward. Again we did the prednisone unfortunately his blood sugars are not doing so well because of this. Nonetheless I believe that the patient likely needs to try topical steroid. We have done triamcinolone for  a while I think going with something stronger such as clobetasol could be beneficial again this is not something I do lightly I discussed this with the patient that again this does not normally put underneath an occlusive dressing. Nonetheless I think a thin film as such could help with some of the stronger anti-inflammatory effects. We discussed this today. He would like to try to give this a trial for the next couple weeks. I definitely think that  is something that we can do. Evaluate7/03/2021 and today patient's wound bed actually showed signs of doing really about the same. There was a little expansion of the size of the wound and that leading edge that we done looking out although the clobetasol does seem to have slowed this down a bit in my opinion. There is just 1 small area that still seems to be progressing based on what I see. Nonetheless I am concerned about the fact this does not seem to be improving if anything seems to be doing a little bit worse. I do not know that the infusions are really helping him much as next infusion is August 5 his appointment with dermatology is July 25. Either way I really think that we need to have a conversation potentially about this and I am actually going to see if I can talk with Dr. Lillia Carmel in order to see where things stand as well. 12/11/2020 upon evaluation today patient appears to be doing worse in regard to his leg ulcer. Unfortunately I just do not think this is making the progress that I would like to see at this point. Honestly he does have an appointment with dermatology and this is in 2 days. I am wondering what they may have to offer to help with this. Right now what I am seeing is that he is continuing to show signs of worsening little by little. Obviously that is not great at all. Is the exact opposite of what we are looking for. 12/18/2020 upon evaluation today patient appears to be doing a little better in regard to his wound. The dermatologist actually did do some steroid injections into the wound which does seem to have been beneficial in my opinion. That was on the 25th already this looks a little better to me than last time I saw him. With that being said we did do a culture and this did show that he has Staph aureus noted in abundance in the wound. With that being said I do think that getting him on an oral antibiotic would be appropriate as well. Also think we can compression  wrap and this will make a difference as well. 12/28/2020 upon evaluation today patient's wound is actually showing signs of doing much better. I do believe the compression wrap is helping he has a lot of drainage but to be honest I think that the compression is helping to some degree in this regard as well as not draining through which is also good news. No fevers, chills, nausea, vomiting, or diarrhea. 01/04/2021 upon evaluation today patient appears to be doing well with regard to his wound. Overall things seem to be doing quite well. He did have a little bit of reaction to the CarboFlex Sorbact he will be using that any longer. With that being said he is controlled as far as the drainage is concerned overall and seems to be doing quite well. I do not see any signs of active infection at this time which is great news. No fevers, chills,  nausea, vomiting, or diarrhea. 01/11/2021 upon evaluation today patient appears to be doing well with regard to his wounds. He has been tolerating the dressing changes without complication. Fortunately there does not appear to be any signs of active infection at this time which is great news. Overall I am extremely pleased with where we stand currently. No fevers, chills, nausea, vomiting, or diarrhea. Where using clobetasol in the wound bed he has a lot of new skin growth which is awesome as well. 01/18/2021 upon evaluation today patient appears to be doing very well in regard to his leg ulcer. He has been tolerating the dressing changes without complication. Fortunately there does not appear to be any signs of active infection which is great news. In general I think that he is making excellent progress 01/25/2021 upon evaluation today patient appears to be doing well with regard to his wound on the leg. I am actually extremely pleased with where things stand today. There does not appear to be any signs of active infection which is great news and overall I think that we  are definitely headed in the appropriate direction based on what I am seeing currently. There does not appear to be any signs of active infection also excellent news. 02/06/2021 upon evaluation today patient appears to be doing well with regard to his wound. Overall visually this is showing signs of significant Aldridge, Cassidy ([id number]) improvement which is great news. I do not see any signs of active infection systemically which is great even locally I do not think that we are seeing any major complications here. We did do fluorescence imaging with the [employer] DX today. The patient does have some odor and drainage noted and again this is something that I think would benefit him to probably come more frequently for nurse visits. 02/19/2021 upon evaluation today patient actually appears to be doing quite well in regard to his wound. He has been tolerating the dressing changes without complication and overall I think that this is making excellent progress. I do not see any evidence of active infection at this point which is great news as well. No fevers, chills, nausea, vomiting, or diarrhea. Objective Constitutional Well-nourished and well-hydrated in no acute distress. Vitals Time Taken: 8:22 AM, Height: 71 in, Weight: 338 lbs, BMI: 47.1, Temperature: 98.0 F, Pulse: 94 bpm, Respiratory Rate: 18 breaths/min, Blood Pressure: 136/82 mmHg. Respiratory normal breathing without difficulty. Psychiatric this patient is able to make decisions and demonstrates good insight into disease process. Alert and Oriented x 3. pleasant and cooperative. General Notes: Upon inspection patient's wound bed actually showed signs of good granulation epithelization at this point. The muscle was completely covered and there is a a lot of new skin growth all of which is great news. I am extremely pleased with where we stand currently. Integumentary (Hair, Skin) Wound #1 status is Open. Original cause of wound was  Gradually Appeared. The date acquired was: 11/18/2015. The wound has been in treatment 219 weeks. The wound is located on the Left,Lateral Lower Leg. The wound measures 7cm length x 5cm width x 0.3cm depth; 27.489cm^2 area and 8.247cm^3 volume. There is muscle, tendon, and Fat Layer (Subcutaneous Tissue) exposed. There is no tunneling or undermining noted. There is a large amount of serosanguineous drainage noted. There is large (67-100%) red, friable granulation within the wound bed. There is a small (1-33%) amount of necrotic tissue within the wound bed including Adherent Slough. 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 There was a Four Layer Compression Therapy Procedure with a pre-treatment ABI of 1.2 by Dolan Amen, RN. Post procedure Diagnosis Wound #1: Same as Pre-Procedure MARCELIS, WISSNER ([id number]) Plan Follow-up Appointments: Return Appointment in 1 week. Nurse Visit as needed - twice a week Bathing/ Shower/ Hygiene: Clean wound with Normal Saline or wound cleanser. Other: - Apply dakins soaked gauze on wound after cleansing wound IN OFFICE ONLY Edema Control - Lymphedema / Segmental Compressive Device / Other: Optional: One layer of unna paste to top of compression wrap (to act as an anchor). Elevate, Exercise Daily and Avoid Standing for Long Periods of Time. Elevate legs to the level of the heart and pump ankles as often as possible Elevate leg(s) parallel to the floor when sitting. Medications-Please add to medication list.: P.O. Antibiotics - Finish antibiotics WOUND #1: -  Lower Leg Wound Laterality: Left, Lateral Cleanser: Wound Cleanser 3 x Per Week/30 Days Discharge Instructions: Wash your hands with soap and water. Remove old dressing, discard into plastic bag and place into trash. Cleanse the wound with Wound Cleanser prior to applying a clean dressing using gauze sponges, not tissues or cotton balls. Do not scrub or use excessive force. Pat dry using gauze sponges, not tissue or cotton balls. Peri-Wound Care: Desitin Maximum Strength Ointment 4 (oz) 3 x Per Week/30 Days Discharge Instructions: Apply from 12 o'clock to 6 o'clock periwound Peri-Wound Care: Triamcinolone Acetonide Cream, 0.1%, 15 (g) tube 3 x Per Week/30 Days Discharge Instructions: Apply on posterior leg for itchiness and irritation Topical: Clobetasol Propionate ointment 0.05%, 60 (g) tube 3 x Per Week/30 Days Discharge Instructions: Apply to 7 o'clock depth and 1 o'clock depth Primary Dressing: Hydrofera Blue Ready Transfer Foam, 4x5 (in/in) 3 x Per Week/30 Days Discharge Instructions: Apply Hydrofera Blue Ready to wound bed as directed Secondary Dressing: Xtrasorb Large 6x9 (in/in) 3 x Per Week/30 Days Discharge Instructions: Cover entire wound dressing Compression Wrap: Medichoice 4 layer Compression System, 35-40 mmHG (Generic) 3 x Per Week/30 Days Discharge Instructions: Apply multi-layer wrap as directed. 1. Would recommend that we go ahead and initiate treatment with a continuation of the compression wrapping which I think is doing a great job for him. We have been utilizing a 4-layer compression wrap. 2. I am also can recommend that we have the patient switch to [facility] we will see how this does I think it could help with cutting back on the bacterial load and help with some of the hypergranulation as well. 3. I am also can recommend the patient continue to elevate his legs much as possible to help with edema control. We will see patient back for reevaluation in 1 week here  in the clinic. If anything worsens or changes patient will contact our office for additional recommendations. Electronic Signature(s) Signed: 02/19/2021 9:41:58 AM By: Worthy Keeler PA-C Entered By: Worthy Keeler on 02/19/2021 09:41:57 Habeeb, [name] ([id number]) -------------------------------------------------------------------------------- SuperBill Details Patient Name: [name] Date of Service: 02/19/2021 Medical Record Number: [id number] Patient Account Number: [id number] Date of Birth/Sex: August 22, 1980 (40 y.o. M) Treating RN: Dolan Amen Primary Care Provider: [name] Other Clinician: Referring Provider: [name] Treating Provider/Extender: [name] Cliche in Treatment: 219 Diagnosis Coding ICD-10  Codes Code Description [phone] Non-pressure chronic ulcer of left calf with fat layer exposed L88 Pyoderma gangrenosum L97.321 Non-pressure chronic ulcer of left ankle limited to breakdown of skin I87.2 Venous insufficiency (chronic) (peripheral) L03.116 Cellulitis of left lower limb F17.208 Nicotine dependence, unspecified, with other nicotine-induced disorders Facility Procedures CPT4 Code: 34758307 Description: (Facility Use Only) [phone] - [place] LWR LT LEG Modifier: Quantity: 1 Physician Procedures CPT4 Code: 4730856 Description: 94370 - WC PHYS LEVEL 4 - EST PT Modifier: Quantity: 1 CPT4 Code: Description: ICD-10 Diagnosis Description L97.222 Non-pressure chronic ulcer of left calf with fat layer exposed L88 Pyoderma gangrenosum L97.321 Non-pressure chronic ulcer of left ankle limited to breakdown of skin I87.2 Venous insufficiency (chronic)  (peripheral) Modifier: Quantity: Electronic Signature(s) Signed: 02/19/2021 9:42:20 AM By: Worthy Keeler PA-C Entered By: Worthy Keeler on 02/19/2021 09:42:20

## 2021-02-20 ENCOUNTER — Encounter: Payer: 59 | Admitting: Physician Assistant

## 2021-02-21 ENCOUNTER — Other Ambulatory Visit: Payer: Self-pay

## 2021-02-21 DIAGNOSIS — L97222 Non-pressure chronic ulcer of left calf with fat layer exposed: Secondary | ICD-10-CM | POA: Diagnosis not present

## 2021-02-21 NOTE — Progress Notes (Signed)
[name], [name] ([id number]) Visit Report for 02/21/2021 Arrival Information Details Patient Name: [name] Date of Service: 02/21/2021 8:00 AM Medical Record Number: [id number] Patient Account Number: [id number] Date of Birth/Sex: 07/21/1980 (40 y.o. M) Treating RN: Donnamarie Poag Primary Care Agam Tuohy: [name] Other Clinician: Referring Marlene Beidler: [name] Treating Deamber Buckhalter/Extender: Yaakov Guthrie in Treatment: 26 Visit Information History Since Last Visit Added or deleted any medications: No Patient Arrived: Ambulatory Hospitalized since last visit: No Arrival Time: 08:03 Has Dressing in Place as Prescribed: Yes Accompanied By: self Has Compression in Place as Prescribed: Yes Transfer Assistance: None Pain Present Now: No Patient Identification Verified: Yes Secondary Verification Process Completed: Yes Patient Requires Transmission-Based No Precautions: Patient Has Alerts: Yes Patient Alerts: Patient has reaction to silver dressings. Electronic Signature(s) Signed: 02/21/2021 11:35:08 AM By: Donnamarie Poag Entered By: Donnamarie Poag on 02/21/2021 08:25:15 [name] ([id number]) -------------------------------------------------------------------------------- Clinic Level of Care Assessment Details Patient Name: [name] Date of Service: 02/21/2021 8:00 AM Medical Record Number: [id number] Patient Account Number: [id number] Date of Birth/Sex: [birth year] (40 y.o. M) Treating RN: Donnamarie Poag Primary Care Viktor Philipp: [name] Other Clinician: Referring Ihsan Nomura: [name] Treating Girolamo Lortie/Extender: Yaakov Guthrie in Treatment: 220 Clinic Level of Care Assessment Items TOOL 1 Quantity Score []  - Use when EandM and Procedure is performed on INITIAL visit 0 ASSESSMENTS - Nursing Assessment / Reassessment []  - General Physical Exam (combine w/ comprehensive assessment (listed just below) when performed on new 0 pt. evals) []  -  0 Comprehensive Assessment (HX, ROS, Risk Assessments, Wounds Hx, etc.) ASSESSMENTS - Wound and Skin Assessment / Reassessment []  - Dermatologic / Skin Assessment (not related to wound area) 0 ASSESSMENTS - Ostomy and/or Continence Assessment and Care []  - Incontinence Assessment and Management 0 []  - 0 Ostomy Care Assessment and Management (repouching, etc.) PROCESS - Coordination of Care []  - Simple Patient / Family Education for ongoing care 0 []  - 0 Complex (extensive) Patient / Family Education for ongoing care []  - 0 Staff obtains Programmer, systems, Records, Test Results / Process Orders []  - 0 Staff telephones HHA, Nursing Homes / Clarify orders / etc []  - 0 Routine Transfer to another Facility (non-emergent condition) []  - 0 Routine Hospital Admission (non-emergent condition) []  - 0 New Admissions / Biomedical engineer / Ordering NPWT, Apligraf, etc. []  - 0 Emergency Hospital Admission (emergent condition) PROCESS - Special Needs []  - Pediatric / Minor Patient Management 0 []  - 0 Isolation Patient Management []  - 0 Hearing / Language / Visual special needs []  - 0 Assessment of Community assistance (transportation, D/C planning, etc.) []  - 0 Additional assistance / Altered mentation []  - 0 Support Surface(s) Assessment (bed, cushion, seat, etc.) INTERVENTIONS - Miscellaneous []  - External ear exam 0 []  - 0 Patient Transfer (multiple staff / Civil Service fast streamer / Similar devices) []  - 0 Simple Staple / Suture removal (25 or less) []  - 0 Complex Staple / Suture removal (26 or more) []  - 0 Hypo/Hyperglycemic Management (do not check if billed separately) []  - 0 Ankle / Brachial Index (ABI) - do not check if billed separately Has the patient been seen at the hospital within the last three years: Yes Total Score: 0 Level Of Care: ____ [name] ([id number]) Electronic Signature(s) Signed: 02/21/2021 11:35:08 AM By: Donnamarie Poag Entered By: Donnamarie Poag on 02/21/2021  08:27:55 Dipinto, [name] ([id number]) -------------------------------------------------------------------------------- Compression Therapy Details Patient Name: [name] Date of Service: 02/21/2021 8:00 AM Medical Record Number: [id number] Patient Account Number: [id number] Date of Birth/Sex: [birth year] (  40 y.o. M) Treating RN: Donnamarie Poag Primary Care Carrah Eppolito: [name] Other Clinician: Referring Taysean Wager: [name] Treating Island Dohmen/Extender: Yaakov Guthrie in Treatment: 220 Compression Therapy Performed for Wound Assessment: Wound #1 Left,Lateral Lower Leg Performed By: Junius Argyle, RN Compression Type: Four Layer Electronic Signature(s) Signed: 02/21/2021 11:35:08 AM By: Donnamarie Poag Entered By: Donnamarie Poag on 02/21/2021 08:27:17 [name] ([id number]) -------------------------------------------------------------------------------- Encounter Discharge Information Details Patient Name: [name] Date of Service: 02/21/2021 8:00 AM Medical Record Number: [id number] Patient Account Number: [id number] Date of Birth/Sex: Nov 09, 1980 (40 y.o. M) Treating RN: Donnamarie Poag Primary Care Keltie Labell: [name] Other Clinician: Referring Raylyn Speckman: [name] Treating Monnica Saltsman/Extender: Yaakov Guthrie in Treatment: 220 Encounter Discharge Information Items Discharge Condition: Stable Ambulatory Status: Ambulatory Discharge Destination: Home Transportation: Private Auto Accompanied By: self Schedule Follow-up Appointment: Yes Clinical Summary of Care: Electronic Signature(s) Signed: 02/21/2021 11:35:08 AM By: Donnamarie Poag Entered By: Donnamarie Poag on 02/21/2021 08:27:49 Mastro, [name] ([id number]) -------------------------------------------------------------------------------- Wound Assessment Details Patient Name: [name] Date of Service: 02/21/2021 8:00 AM Medical Record Number: [id number] Patient Account Number: [id number] Date  of Birth/Sex: May 16, 1981 (40 y.o. M) Treating RN: Donnamarie Poag Primary Care Teona Vargus: [name] Other Clinician: Referring Kathlyn Leachman: [name] Treating Sorah Falkenstein/Extender: Yaakov Guthrie in Treatment: 220 Wound Status Wound Number: 1 Primary Etiology: Pyoderma Wound Location: Left, Lateral Lower Leg Wound Status: Open Wounding Event: Gradually Appeared Comorbid History: Sleep Apnea, Hypertension, Colitis Date Acquired: 11/18/2015 Weeks Of Treatment: 220 Clustered Wound: No Photos Wound Measurements Length: (cm) 7 Width: (cm) 5 Depth: (cm) 0.3 Area: (cm) 27.489 Volume: (cm) 8.247 % Reduction in Area: -460% % Reduction in Volume: -110% Epithelialization: Medium (34-66%) Wound Description Classification: Full Thickness With Exposed Support Structures Exudate Amount: Large Exudate Type: Serosanguineous Exudate Color: red, brown Foul Odor After Cleansing: No Slough/Fibrino Yes Wound Bed Granulation Amount: Large (67-100%) Exposed Structure Granulation Quality: Red, Friable Fascia Exposed: No Necrotic Amount: Small (1-33%) Fat Layer (Subcutaneous Tissue) Exposed: Yes Necrotic Quality: Adherent Slough Tendon Exposed: Yes Muscle Exposed: Yes Necrosis of Muscle: No Joint Exposed: No Bone Exposed: No Treatment Notes Wound #1 (Lower Leg) Wound Laterality: Left, Lateral Cleanser Wound Cleanser Discharge Instruction: Wash your hands with soap and water. Remove old dressing, discard into plastic bag and place into trash. Cleanse the wound with Wound Cleanser prior to applying a clean dressing using gauze sponges, not tissues or cotton balls. Do not scrub or use excessive force. Pat dry using gauze sponges, not tissue or cotton balls. KHORI, ROSEVEAR ([id number]) Peri-Wound Care Desitin Maximum Strength Ointment 4 (oz) Discharge Instruction: Apply from 12 o'clock to 6 o'clock periwound Triamcinolone Acetonide Cream, 0.1%, 15 (g) tube Discharge Instruction:  Apply on posterior leg for itchiness and irritation Topical Clobetasol Propionate ointment 0.05%, 60 (g) tube Discharge Instruction: Apply to 7 o'clock depth and 1 o'clock depth Primary Dressing Hydrofera Blue Ready Transfer Foam, 4x5 (in/in) Discharge Instruction: Apply Hydrofera Blue Ready to wound bed as directed Secondary Dressing Xtrasorb Large 6x9 (in/in) Discharge Instruction: Cover entire wound dressing Secured With Compression Wrap Medichoice 4 layer Compression System, 35-40 mmHG Discharge Instruction: Apply multi-layer wrap as directed. Compression Stockings Add-Ons Electronic Signature(s) Signed: 02/21/2021 11:35:08 AM By: Donnamarie Poag Entered ByDonnamarie Poag on 02/21/2021 08:26:09

## 2021-02-23 ENCOUNTER — Other Ambulatory Visit: Payer: Self-pay

## 2021-02-23 DIAGNOSIS — L97222 Non-pressure chronic ulcer of left calf with fat layer exposed: Secondary | ICD-10-CM | POA: Diagnosis not present

## 2021-02-23 NOTE — Progress Notes (Signed)
[name], [name] ([id number]) Visit Report for 02/23/2021 Arrival Information Details Patient Name: [name] Date of Service: 02/23/2021 8:00 AM Medical Record Number: [id number] Patient Account Number: [id number] Date of Birth/Sex: 21-Jun-1980 (40 y.o. M) Treating RN: Donnamarie Poag Primary Care Winfrey Chillemi: [name] Other Clinician: Referring Dacota Devall: [name] Treating Aanvi Voyles/Extender: [name] Cliche in Treatment: 47 Visit Information History Since Last Visit Added or deleted any medications: No Patient Arrived: Ambulatory Had a fall or experienced change in No Arrival Time: 08:15 activities of daily living that may affect Accompanied By: self risk of falls: Transfer Assistance: None Hospitalized since last visit: No Patient Identification Verified: Yes Has Dressing in Place as Prescribed: Yes Secondary Verification Process Completed: Yes Has Compression in Place as Prescribed: Yes Patient Requires Transmission-Based No Pain Present Now: No Precautions: Patient Has Alerts: Yes Patient Alerts: Patient has reaction to silver dressings. Electronic Signature(s) Signed: 02/23/2021 4:12:32 PM By: Donnamarie Poag Entered By: Donnamarie Poag on 02/23/2021 08:29:38 [name] ([id number]) -------------------------------------------------------------------------------- Clinic Level of Care Assessment Details Patient Name: [name] Date of Service: 02/23/2021 8:00 AM Medical Record Number: [id number] Patient Account Number: [id number] Date of Birth/Sex: January 12, 1981 (40 y.o. M) Treating RN: Donnamarie Poag Primary Care Estevan Kersh: [name] Other Clinician: Referring Hali Balgobin: [name] Treating Joyel Chenette/Extender: [name] Cliche in Treatment: 220 Clinic Level of Care Assessment Items TOOL 1 Quantity Score []  - Use when EandM and Procedure is performed on INITIAL visit 0 ASSESSMENTS - Nursing Assessment / Reassessment []  - General Physical Exam (combine w/  comprehensive assessment (listed just below) when performed on new 0 pt. evals) []  - 0 Comprehensive Assessment (HX, ROS, Risk Assessments, Wounds Hx, etc.) ASSESSMENTS - Wound and Skin Assessment / Reassessment []  - Dermatologic / Skin Assessment (not related to wound area) 0 ASSESSMENTS - Ostomy and/or Continence Assessment and Care []  - Incontinence Assessment and Management 0 []  - 0 Ostomy Care Assessment and Management (repouching, etc.) PROCESS - Coordination of Care []  - Simple Patient / Family Education for ongoing care 0 []  - 0 Complex (extensive) Patient / Family Education for ongoing care []  - 0 Staff obtains Programmer, systems, Records, Test Results / Process Orders []  - 0 Staff telephones HHA, Nursing Homes / Clarify orders / etc []  - 0 Routine Transfer to another Facility (non-emergent condition) []  - 0 Routine Hospital Admission (non-emergent condition) []  - 0 New Admissions / Biomedical engineer / Ordering NPWT, Apligraf, etc. []  - 0 Emergency Hospital Admission (emergent condition) PROCESS - Special Needs []  - Pediatric / Minor Patient Management 0 []  - 0 Isolation Patient Management []  - 0 Hearing / Language / Visual special needs []  - 0 Assessment of Community assistance (transportation, D/C planning, etc.) []  - 0 Additional assistance / Altered mentation []  - 0 Support Surface(s) Assessment (bed, cushion, seat, etc.) INTERVENTIONS - Miscellaneous []  - External ear exam 0 []  - 0 Patient Transfer (multiple staff / Civil Service fast streamer / Similar devices) []  - 0 Simple Staple / Suture removal (25 or less) []  - 0 Complex Staple / Suture removal (26 or more) []  - 0 Hypo/Hyperglycemic Management (do not check if billed separately) []  - 0 Ankle / Brachial Index (ABI) - do not check if billed separately Has the patient been seen at the hospital within the last three years: Yes Total Score: 0 Level Of Care: ____ [name] ([id number]) Electronic  Signature(s) Signed: 02/23/2021 4:12:32 PM By: Donnamarie Poag Entered By: Donnamarie Poag on 02/23/2021 08:32:09 [name], [name] ([id number]) -------------------------------------------------------------------------------- Compression Therapy Details Patient Name: [name]  Date of Service: 02/23/2021 8:00 AM Medical Record Number: [id number] Patient Account Number: [id number] Date of Birth/Sex: Dec 31, 1980 (40 y.o. M) Treating RN: Donnamarie Poag Primary Care Aretha Levi: [name] Other Clinician: Referring Tallis Soledad: [name] Treating Kharter Sestak/Extender: [name] Cliche in Treatment: 220 Compression Therapy Performed for Wound Assessment: Wound #1 Left,Lateral Lower Leg Performed By: Junius Argyle, RN Compression Type: Four Layer Electronic Signature(s) Signed: 02/23/2021 4:12:32 PM By: Donnamarie Poag Entered By: Donnamarie Poag on 02/23/2021 08:31:27 [name] ([id number]) -------------------------------------------------------------------------------- Encounter Discharge Information Details Patient Name: [name] Date of Service: 02/23/2021 8:00 AM Medical Record Number: [id number] Patient Account Number: [id number] Date of Birth/Sex: [birth year] (40 y.o. M) Treating RN: Donnamarie Poag Primary Care Garan Frappier: [name] Other Clinician: Referring Chrystle Murillo: [name] Treating Glyn Zendejas/Extender: [name] Cliche in Treatment: 220 Encounter Discharge Information Items Discharge Condition: Stable Ambulatory Status: Ambulatory Discharge Destination: Home Transportation: Private Auto Accompanied By: self Schedule Follow-up Appointment: Yes Clinical Summary of Care: Electronic Signature(s) Signed: 02/23/2021 4:12:32 PM By: Donnamarie Poag Entered By: Donnamarie Poag on 02/23/2021 08:32:03 [name] ([id number]) -------------------------------------------------------------------------------- Wound Assessment Details Patient Name: [name] Date of Service:  02/23/2021 8:00 AM Medical Record Number: [id number] Patient Account Number: [id number] Date of Birth/Sex: [birth year] (40 y.o. M) Treating RN: Donnamarie Poag Primary Care Jaxton Casale: [name] Other Clinician: Referring Aaliayah Miao: [name] Treating Chelan Heringer/Extender: [name] Cliche in Treatment: 220 Wound Status Wound Number: 1 Primary Etiology: Pyoderma Wound Location: Left, Lateral Lower Leg Wound Status: Open Wounding Event: Gradually Appeared Comorbid History: Sleep Apnea, Hypertension, Colitis Date Acquired: 11/18/2015 Weeks Of Treatment: 220 Clustered Wound: No Wound Measurements Length: (cm) 7 Width: (cm) 5 Depth: (cm) 0.3 Area: (cm) 27.489 Volume: (cm) 8.247 % Reduction in Area: -460% % Reduction in Volume: -110% Epithelialization: Medium (34-66%) Wound Description Classification: Full Thickness With Exposed Support Structures Exudate Amount: Large Exudate Type: Serosanguineous Exudate Color: red, brown Foul Odor After Cleansing: No Slough/Fibrino Yes Wound Bed Granulation Amount: Large (67-100%) Exposed Structure Granulation Quality: Red, Friable Fascia Exposed: No Necrotic Amount: Small (1-33%) Fat Layer (Subcutaneous Tissue) Exposed: Yes Necrotic Quality: Adherent Slough Tendon Exposed: Yes Muscle Exposed: Yes Necrosis of Muscle: No Joint Exposed: No Bone Exposed: No Treatment Notes Wound #1 (Lower Leg) Wound Laterality: Left, Lateral Cleanser Wound Cleanser Discharge Instruction: Wash your hands with soap and water. Remove old dressing, discard into plastic bag and place into trash. Cleanse the wound with Wound Cleanser prior to applying a clean dressing using gauze sponges, not tissues or cotton balls. Do not scrub or use excessive force. Pat dry using gauze sponges, not tissue or cotton balls. Peri-Wound Care Desitin Maximum Strength Ointment 4 (oz) Discharge Instruction: Apply from 12 o'clock to 6 o'clock periwound Triamcinolone Acetonide  Cream, 0.1%, 15 (g) tube Discharge Instruction: Apply on posterior leg for itchiness and irritation Topical Clobetasol Propionate ointment 0.05%, 60 (g) tube Discharge Instruction: Apply to 7 o'clock depth and 1 o'clock depth Primary Dressing Hydrofera Blue Ready Transfer Foam, 4x5 (in/in) Discharge Instruction: Apply Hydrofera Blue Ready to wound bed as directed VEER, ELAMIN ([id number]) Secondary Dressing Xtrasorb Large 6x9 (in/in) Discharge Instruction: Cover entire wound dressing Secured With Compression Wrap Medichoice 4 layer Compression System, 35-40 mmHG Discharge Instruction: Apply multi-layer wrap as directed. Compression Stockings Add-Ons Electronic Signature(s) Signed: 02/23/2021 4:12:32 PM By: Donnamarie Poag Entered ByDonnamarie Poag on 02/23/2021 08:29:57

## 2021-02-26 ENCOUNTER — Encounter: Payer: 59 | Admitting: Internal Medicine

## 2021-02-26 ENCOUNTER — Other Ambulatory Visit: Payer: Self-pay

## 2021-02-26 DIAGNOSIS — L97222 Non-pressure chronic ulcer of left calf with fat layer exposed: Secondary | ICD-10-CM | POA: Diagnosis not present

## 2021-02-26 NOTE — Progress Notes (Signed)
[name], [name] ([id number]) Visit Report for 02/26/2021 HPI Details Patient Name: [name], [name] Date of Service: 02/26/2021 8:15 AM Medical Record Number: [id number] Patient Account Number: [id number] Date of Birth/Sex: 07/11/1980 (40 y.o. M) Treating RN: Dolan Amen Primary Care Provider: [name] Other Clinician: Referring Provider: [name] Treating Provider/Extender: Tito Dine in Treatment: 75 History of Present Illness HPI Description: 12/04/16; 40 year old man who comes into the clinic today for review of a wound on the posterior left calf. He tells me that is been there for about a year. He is not a diabetic he does smoke half a pack per day. He was seen in the ER on 11/20/16 felt to have cellulitis around the wound and was given clindamycin. An x-ray did not show osteomyelitis. The patient initially tells me that he has a milk allergy that sets off a pruritic itching rash on his lower legs which she scratches incessantly and he thinks that's what may have set up the wound. He has been using various topical antibiotics and ointments without any effect. He works in a [employer] and is on his feet all day. He does not have a prior history of wounds however he does have the rash on both lower legs the right arm and the ventral aspect of his left arm. These are excoriations and clearly have had scratching however there are of macular looking areas on both legs including a substantial larger area on the right leg. This does not have an underlying open area. There is no blistering. The patient tells me that 2 years ago in [place] in response to the rash on his legs he saw a dermatologist who told him he had a condition which may be pyoderma gangrenosum although I may be putting words into his mouth. He seemed to recognize this. On further questioning he admits to a 5 year history of quiesced. ulcerative colitis. He is not in any treatment for this. He's had no recent  travel 12/11/16; the patient arrives today with his wound and roughly the same condition we've been using silver alginate this is a deep punched out wound with some surrounding erythema but no tenderness. Biopsy I did did not show confirmed pyoderma gangrenosum suggested nonspecific inflammation and vasculitis but does not provide an actual description of what was seen by the pathologist. I'm really not able to understand this We have also received information from the patient's dermatologist in [place] notes from April 2016. This was a doctor Agarwal-antal. 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 He was prescribed topical steroid high potency under occlusion which helped but at this point the patient did not have a deep punched out wound. 12/18/16; the patient's wound is larger in terms of surface area however this surface looks better and there is less depth. The surrounding erythema also is better. The patient states that the wrap we put on came off 2 days ago when he has been using his compression stockings. He we are in the process of getting a dermatology consult. 12/26/16 on evaluation today patient's left lower extremity wound shows evidence of infection with surrounding erythema noted. He has been tolerating the dressing changes but states that he has noted more discomfort. There is a larger area of erythema surrounding the wound. No fevers, chills, nausea, or vomiting noted at this time. With that being said the wound still does have slough covering the surface. He is not allergic to any medication that he is aware of at this point.  In regard to his right lower extremity he had several regions that are erythematous and pruritic he wonders if there's anything we can do to help that. 01/02/17 I reviewed patient's wound culture which was obtained his visit last week. He was placed on doxycycline at that point. Unfortunately that does not appear to be an antibiotic that would likely  help with the situation however the pseudomonas noted on culture is sensitive to Cipro. Also unfortunately patient's wound seems to have a large compared to last week's evaluation. Not severely so but there are definitely increased measurements in general. He is continuing to have discomfort as well he writes this to be a seven out of 10. In fact he would prefer me not to perform any debridement today due to the fact that he is having discomfort and considering he has an active infection on the little reluctant to do so anyway. No fevers, chills, nausea, or vomiting noted at this time. 01/08/17; patient seems dermatology on September 5. I suspect dermatology will want the slides from the biopsy I did sent to their pathologist. I'm not sure if there is a way we can expedite that. In any case the culture I did before I left on vacation 3 weeks ago showed Pseudomonas he was given 10 days of Cipro and per her description of her intake nurses is actually somewhat better this week although the wound is quite a bit bigger than I remember the last time I saw this. He still has 3 more days of Cipro 01/21/17; dermatology appointment tomorrow. He has completed the ciprofloxacin for Pseudomonas. Surface of the wound looks better however he is had some deterioration in the lesions on his right leg. Meantime the left lateral leg wound we will continue with sample 01/29/17; patient had his dermatology appointment but I can't yet see that note. He is completed his antibiotics. The wound is more superficial but considerably larger in circumferential area than when he came in. This is in his left lateral calf. He also has swollen erythematous areas with superficial wounds on the right leg and small papular areas on both arms. There apparently areas in her his upper thighs and buttocks I did not look at those. Dermatology biopsied the right leg. Hopefully will have their input next week. 02/05/17; patient went back to see  his dermatologist who told him that he had a "scratching problem" as well as staph. He is now on a 30 day course of doxycycline and I believe she gave him triamcinolone cream to the right leg areas to help with the itching [not exactly sure but probably triamcinolone]. She apparently looked at the left lateral leg wound although this was not rebiopsied and I think felt to be ultimately part of the same pathogenesis. He is using sample border foam and changing nevus himself. He now has a new open area on the right posterior leg which was his biopsy site I don't have any of the dermatology notes 02/12/17; we put the patient in compression last week with SANTYL to the wound on the left leg and the biopsy. Edema is much better and the depth of the wound is now at level of skin. Area is still the same oBiopsy site on the right lateral leg we've also been using santyl with a border foam dressing and he is changing this himself. 02/19/17; Using silver alginate started last week to both the substantial left leg wound and the biopsy site on the right wound. He is tolerating compression  well. Has a an appointment with his primary M.D. tomorrow wondering about diuretics although I'm wondering if the edema problem is actually lymphedema 02/26/17; the patient has been to see his primary doctor Dr. Jerrel Ivory at [facility] our primary care. She started him on Lasix 20 mg and this Herrmann, Joffre ([id number]) seems to have helped with the edema. However we are not making substantial change with the left lateral calf wound and inflammation. The biopsy site on the right leg also looks stable but not really all that different. 03/12/17; the patient has been to see vein and vascular Dr. Lucky Cowboy. He has had venous reflux studies I have not reviewed these. I did get a call from his dermatology office. They felt that he might have pathergy based on their biopsy on his right leg which led them to look at the slides of the biopsy  I did on the left leg and they wonder whether this represents pyoderma gangrenosum which was the original supposition in a man with ulcerative colitis albeit inactive for many years. They therefore recommended clobetasol and tetracycline i.e. 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 03/26/17; apparently the patient just had reflux studies not an appointment with Dr. dew. She arrives in clinic today having applied clobetasol for 2-3 weeks. He notes over the last 2-3 days excessive drainage having to change the dressing 3-4 times a day and also expanding erythema. 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 Lillia Carmel at [facility] dermatology locally who kindly saw him at my request. 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 My concern was the degree of erythema medially extending from his left leg wound which was either inflammation from pyoderma or cellulitis. I put him on Augmentin however culture of the wound showed Pseudomonas which is quinolone sensitive. I really don't believe he has cellulitis however in view of everything I will continue and give him a course of Cipro. He is also on doxycycline as an immune modulator for the pyoderma. In addition to his original wound on the left lateral leg with surrounding erythema he has a wound on the right posterior calf which was an original biopsy site done by dermatology. 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 Saw Dr. Lillia Carmel yesterday. He has been using topical antibiotics to both wound areas his original wound on the left and the biopsies/pathergy area on the right. There  is definitely some improvement in the inflammation around the wound on the right although the patient states he has increasing sensitivity of the wounds. He is on prednisone 60 and doxycycline 1 as prescribed by Dr. Lillia Carmel. He is covering the topical antibiotic with gauze and putting this in his own compression stocks and changing this daily. He states that Dr. Lottie Rater did a culture of the left leg wound yesterday 05/07/17; pyoderma gangrenosum. The patient saw Dr. Lillia Carmel yesterday and has a follow-up with her in one month. He is still using topical antibiotics to both wounds although he can't recall exactly what type. He is still on prednisone 60 mg. Dr. Lillia Carmel stated that the doxycycline could stop if we were in agreement. 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 The right medial leg was induced by biopsy/pathergy. The area on the right is essentially healed. 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 The right medial leg has closed and remains closed. He is still on prednisone 60. oHe tells me he missed his last dermatology appointment with Dr. Lillia Carmel but will make another appointment. He reports that her blood sugar at a recent screen in [place] was high 200's. He was 180 today. 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 I think he is going to require still much longer prednisone perhaps another 3 months before attempting to taper. In the meantime his wound is a lot better. Smaller. He is cleaning this off daily and applying topical antibiotics. When he was last in the clinic I thought about changing to [facility] and actually put in a couple of calls to dermatology although probably not during their business hours. In any case the wound looks better smaller I don't think there is  any need to change what he is doing 08/06/17-he is here in follow up evaluation for pyoderma left leg ulcer. He continues on oral prednisone. He has been using triple antibiotic ointment. There is surface debris and we will transition to [facility] and have him return in 2 weeks. He has lost 30 pounds since his last appointment with lifestyle modification. He may benefit from topical steroid cream for treatment this can be considered at a later date. 08/22/17 on evaluation today patient appears to actually be doing rather well in regard to his left lateral lower extremity ulcer. He has actually been managed by Dr. Dellia Nims most recently. Patient is currently on oral steroids at this time. This seems to have been of benefit for him. Nonetheless his last visit was actually with Leah on 08/06/17. Currently he is not utilizing any topical steroid creams although this could be of benefit as well. No fevers, chills, nausea, or vomiting noted at this time. 09/05/17 on evaluation today patient appears to be doing better in regard to his left lateral lower extremity ulcer. He has been tolerating the dressing changes without complication. He is using Santyl with good effect. Overall I'm very pleased with how things are standing at this point. Patient likewise is happy that this is doing better. 09/19/17 on evaluation today patient actually appears to be doing rather well in regard to his left lateral lower extremity ulcer. 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 He's not having any significant pain. 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 He has been tolerating the Santyl without complication and in general I feel like he's making good progress. 10/17/17 on evaluation today patient appears to be doing very well in regard to his left  lateral lower surety ulcer. He has been tolerating the dressing changes without complication. There does not appear to be any evidence of infection he's alternating the Santyl and the triple antibiotic ointment every other day this seems to be doing well for him. 11/03/17 on evaluation today patient appears to be doing very well in regard to his left lateral lower extremity ulcer. He is been tolerating the dressing changes without complication which is good news. Fortunately there does not  appear to be any evidence of infection which is also great news. Overall is doing excellent they are starting to taper down on the prednisone is down to 40 mg at this point it also started topical clobetasol for him. 11/17/17 on evaluation today patient appears to be doing well in regard to his left lateral lower surety ulcer. He's been tolerating the dressing changes without complication. He does note that he is having no pain, no excessive drainage or discharge, and overall he feels like things are going about how he would expect and hope they would. Overall he seems to have no evidence of infection at this time in my opinion which is good news. 12/04/17-He is seen in follow-up evaluation for right lateral lower extremity ulcer. He has been applying topical steroid cream. Today's measurement show slight increase in size. Over the next 2 weeks we will transition to every other day Santyl and steroid cream. He has been encouraged to monitor for changes and notify clinic with any concerns 12/15/17 on evaluation today patient's left lateral motion the ulcer and fortunately is doing worse again at this point. This just since last week to this week has close to doubled in size according to the patient. I did not seeing last week's I do not have a visual to compare this to in our system was also down so we do not have all the charts and at this point. Nonetheless it does have me somewhat concerned in regard to the fact  that again he was worried enough about it he has contact the dermatology that placed them back on the full strength, 50 mg a day of the prednisone that he was taken previous. He continues to alternate using clobetasol along with Santyl at this point. He is obviously somewhat frustrated. [name], [name] ([id number]) 12/22/17 on evaluation today patient appears to be doing a little worse compared to last evaluation. Unfortunately the wound is a little deeper and slightly larger than the last week's evaluation. With that being said he has made some progress in regard to the irritation surrounding at this time unfortunately despite that progress that's been made he still has a significant issue going on here. 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 For that reason I discussed with him today the possibility of perform a wound culture to ensure there's nothing overtly infected. 01/06/18 on evaluation today patient's wound is larger and deeper than previously evaluated. With that being said it did appear that his wound was infected after my last evaluation with him. Subsequently I did end up prescribing a prescription for Bactrim DS which she has been taking and having no complication with. Fortunately there does not appear to be any evidence of infection at this point in time as far as anything spreading, no want to touch, and overall I feel like things are showing signs of improvement. 01/13/18 on evaluation today patient appears to be even a little larger and deeper than last time. There still muscle exposed in the base of the wound. Nonetheless he does appear to be less erythematous I do believe inflammation is calming down also believe the infection looks like it's probably resolved at this time based on what I'm seeing. No fevers, chills, nausea, or  vomiting noted at this time. 01/30/18 on evaluation today patient actually appears to visually look better for the most part. Unfortunately  those visually this looks better he does seem to potentially have what may be an abscess in the muscle that has been noted in the central portion of the wound. This is the first time that I have noted what appears to be fluctuance in the central portion of the muscle. With that being said I'm somewhat more concerned about the fact that this might indicate an abscess formation at this location. I do believe that an ultrasound would be appropriate. This is likely something we need to try to do as soon as possible. He has been switch to mupirocin ointment and he is no longer using the steroid ointment as prescribed by dermatology he sees them again next week he's been decreased from 60 to 40 mg of prednisone. 03/09/18 on evaluation today patient actually appears to be doing a little better compared to last time I saw him. There's not as much erythema surrounding the wound itself. He I did review his most recent infectious disease note which was dated 02/24/18. He saw Dr. Michel Bickers in [place]. 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 Patient is now off of antibiotics and they are continually observing this. There seems to be no change in the past two weeks in my pinion based on what the patient says and what I see today compared to what Dr. Megan Salon likely saw two weeks ago. No fevers, chills, nausea, or vomiting noted at this time. 03/23/18 on evaluation today patient's wound actually appears to be showing signs of improvement which is good news. He is currently still on the Dapsone. He is also working on tapering the prednisone to get off of this and Dr. Lottie Rater is working with him in this regard. Nonetheless overall I feel like the wound is doing well it does appear  based on the infectious disease note that I reviewed from Dr. 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 I did also review the results of his ultrasound of left lower extremity which revealed there was a dentist tissue in the base of the wound without an abscess noted. 04/06/18 on evaluation today the patient's left lateral lower extremity ulcer actually appears to be doing fairly well which is excellent news. There does not appear to be any evidence of infection at this time which is also great news. Overall he still does have a significantly large ulceration although little by little he seems to be making progress. He is down to 10 mg a day of the prednisone. 04/20/18 on evaluation today patient actually appears to be doing excellent at this time in regard to his left lower extremity ulcer. He's making signs of good progress unfortunately this is taking much longer than we would really like to see but nonetheless he is making progress. Fortunately there does not appear to be any evidence of infection at this time. No fevers, chills, nausea, or vomiting noted at this time. The patient has not been using the Santyl due to the cost he hadn't got in this field yet. He's mainly been using the antibiotic ointment topically. Subsequently he also tells me that he really has not been scrubbing in the shower I think this would be helpful again as I told him it doesn't have to be anything too aggressive to even make it  believe just enough to keep it free of some of the loose slough and biofilm on the wound surface. 05/11/18 on evaluation today patient's wound appears to be making slow but sure progress in regard to the left lateral lower extremity ulcer. He is been tolerating the dressing changes without complication. Fortunately there does not appear to be any evidence of infection at this time. He  is still just using triple antibiotic ointment along with clobetasol occasionally over the area. He never got the Santyl and really does not seem to intend to in my pinion. 06/01/18 on evaluation today patient appears to be doing a little better in regard to his left lateral lower extremity ulcer. He states that overall he does not feel like he is doing as well with the Dapsone as he did with the prednisone. Nonetheless he sees his dermatologist later today and is gonna talk to them about the possibility of going back on the prednisone. Overall again I believe that the wound would be better if you would utilize Santyl but he really does not seem to be interested in going back to the [place] at this point. He has been using triple antibiotic ointment. 06/15/18 on evaluation today patient's wound actually appears to be doing about the same at this point. Fortunately there is no signs of infection at this time. He has made slight improvements although he continues to not really want to clean the wound bed at this point. He states that he just doesn't mess with it he doesn't want to cause any problems with everything else he has going on. He has been on medication, antibiotics as prescribed by his dermatologist, for a staff infection of his lower extremities which is really drying out now and looking much better he tells me. Fortunately there is no sign of overall infection. 06/29/18 on evaluation today patient appears to be doing well in regard to his left lateral lower surety ulcer all things considering. Fortunately his staff infection seems to be greatly improved compared to previous. He has no signs of infection and this is drying up quite nicely. He is still the doxycycline for this is no longer on cental, Dapsone, or any of the other medications. His dermatologist has recommended possibility of an infusion but right now he does not want to proceed with that. 07/13/18 on evaluation today patient appears  to be doing about the same in regard to his left lateral lower surety ulcer. Fortunately there's no signs of infection at this time which is great news. Unfortunately he still builds up a significant amount of Slough/biofilm of the surface of the wound he still is not really cleaning this as he should be appropriately. Again I'm able to easily with saline and gauze remove the majority of this on the surface which if you would do this at home would likely be a dramatic improvement for him as far as getting the area to improve. Nonetheless overall I still feel like he is making progress is just very slow. I think Santyl will be of benefit for him as well. Still he has not gotten this as of this point. 07/27/18 on evaluation today patient actually appears to be doing little worse in regards of the erythema around the periwound region of the wound he also tells me that he's been having more drainage currently compared to what he was experiencing last time I saw him. He states not quite as bad as what he had because this was infected previously but nonetheless  is still appears to be doing poorly. Fortunately there is no evidence of systemic infection at this point. The patient tells me that he is not going to be able to afford the Santyl. He is still waiting to hear about the infusion therapy with his dermatologist. Apparently she wants an updated colonoscopy first. [name], [name] ([id number]) 08/10/18 on evaluation today patient appears to be doing better in regard to his left lateral lower extremity ulcer. Fortunately he is showing signs of improvement in this regard he's actually been approved for Remicade infusion's as well although this has not been scheduled as of yet. Fortunately there's no signs of active infection at this time in regard to the wound although he is having some issues with infection of the right lower extremity is been seen as dermatologist for this. Fortunately they are definitely still  working with him trying to keep things under control. 09/07/18 on evaluation today patient is actually doing rather well in regard to his left lateral lower extremity ulcer. He notes these actually having some hair grow back on his extremity which is something he has not seen in years. He also tells me that the pain is really not giving them any trouble at this time which is also good news overall she is very pleased with the progress he's using a combination of the mupirocin along with the probate is all mixed. 09/21/18 on evaluation today patient actually appears to be doing fairly well all things considered in regard to his looks from the ulcer. He's been tolerating the dressing changes without complication. Fortunately there's no signs of active infection at this time which is good news he is still on all antibiotics or prevention of the staff infection. He has been on prednisone for time although he states it is gonna contact his dermatologist and see if she put them on a short course due to some irritation that he has going on currently. Fortunately there's no evidence of any overall worsening this is going very slow I think cental would be something that would be helpful for him although he states that $50 for tube is quite expensive. He therefore is not willing to get that at this point. 10/06/18 on evaluation today patient actually appears to be doing decently well in regard to his left lateral leg ulcer. He's been tolerating the dressing changes without complication. Fortunately there's no signs of active infection at this time. Overall I'm actually rather pleased with the progress he's making although it's slow he doesn't show any signs of infection and he does seem to be making some improvement. I do believe that he may need a switch up and dressings to try to help this to heal more appropriately and quickly. 10/19/18 on evaluation today patient actually appears to be doing better in regard to  his left lateral lower extremity ulcer. This is shown signs of having much less Slough buildup at this point due to the fact he has been using the [employer]. Obviously this is very good news. The overall size of the wound is not dramatically smaller but again the appearance is. 11/02/18 on evaluation today patient actually appears to be doing quite well in regard to his lower [place] ulcer. A lot of the skin around the ulcer is actually somewhat irritating at this point this seems to be more due to the dressing causing irritation from the adhesive that anything else. Fortunately there is no signs of active infection at this time. 11/24/18 on evaluation today patient appears  to be doing a little worse in regard to his overall appearance of his lower extremity ulcer. There's more erythema and warmth around the wound unfortunately. He is currently on doxycycline which he has been on for some time. With that being said I'm not sure that seems to be helping with what appears to possibly be an acute cellulitis with regard to his left lower extremity ulcer. No fevers, chills, nausea, or vomiting noted at this time. 12/08/18 on evaluation today patient's wounds actually appears to be doing significantly better compared to his last evaluation. He has been using Santyl along with alternating tripling about appointment as well as the steroid cream seems to be doing quite well and the wound is showing signs of improvement which is excellent news. Fortunately there's no evidence of infection and in fact his culture came back negative with only normal skin flora noted. 12/21/2018 upon evaluation today patient actually appears to be doing excellent with regard to his ulcer. This is actually the best that I have seen it since have been helping to take care of him. It is both smaller as well as less slough noted on the surface of the wound and seems to be showing signs of good improvement with new skin growing from the edges.  He has been using just the triamcinolone he does wonder if he can get a refill of that ointment today. 01/04/2019 upon evaluation today patient actually appears to be doing well with regard to his left lateral lower extremity ulcer. With that being said it does not appear to be that he is doing quite as well as last time as far as progression is concerned. There does not appear to be any signs of infection or significant irritation which is good news. With that being said I do believe that he may benefit from switching to a collagen based dressing based on how clean The wound appears. 01/18/2019 on evaluation today patient actually appears to be doing well with regard to his wound on the left lower extremity. He is not made a lot of progress compared to where we were previous but nonetheless does seem to be doing okay at this time which is good news. There is no signs of active infection which is also good news. My only concern currently is I do wish we can get him into utilizing the collagen dressing his insurance would not pay for the supplies that we ordered although it appears that he may be able to order this through his supply company that he typically utilizes. This is [employer]. Nonetheless he did try to order it during the office visit today and it appears this did go through. We will see if he can get that it is a different brand but nonetheless he has collagen and I do think will be beneficial. 02/01/2019 on evaluation today patient actually appears to be doing a little worse today in regard to the overall size of his wounds. Fortunately there is no signs of active infection at this time. That is visually. Nonetheless when this is happened before it was due to infection. For that reason were somewhat concerned about that this time as well. 02/08/2019 on evaluation today patient unfortunately appears to be doing slightly worse with regard to his wound upon evaluation today. Is measuring a little  deeper and a little larger unfortunately. I am not really sure exactly what is causing this to enlarge he actually did see his dermatologist she is going to see about initiating Humira  for him. Subsequently she also did do steroid injections into the wound itself in the periphery. Nonetheless still nonetheless he seems to be getting a little bit larger he is gone back to just using the steroid cream topically which I think is appropriate. I would say hold off on the collagen for the time being is definitely a good thing to do. Based on the culture results which we finally did get the final result back regarding it shows staph as the bacteria noted again that can be a normal skin bacteria based on the fact however he is having increased drainage and worsening of the wound measurement wise I would go ahead and place him on an antibiotic today I do believe for this. 02/15/2019 on evaluation today patient actually appears to be doing somewhat better in regard to his ulcer. There is no signs of worsening at this time I did review his culture results which showed evidence of Staphylococcus aureus but not MRSA. Again this could just be more related to the normal skin bacteria although he states the drainage has slowed down quite a bit he may have had a mild infection not just colonization. And was much smaller and then since around10/04/2019 on evaluation today patient appears to be doing unfortunately worse as far as the size of the wound. I really feel like that this is steadily getting larger again it had been doing excellent right at the beginning of September we have seen a steady increase in the area of the wound it is almost 2-1/2 times the size it was on September 1. Obviously this is a bad trend this is not wanting to see. For that reason we went back to using just the topical triamcinolone cream which does seem to help with inflammation. I checked him for bacteria by way of culture and nothing showed  positive there. I am considering giving him a short course of a tapering steroid Dosepak today to see if that is can be beneficial for him. The patient is in agreement with giving that a try. [name], [name] ([id number]) 03/08/2019 on evaluation today patient appears to be doing very well in comparison to last evaluation with regard to his lower extremity ulcer. This is showing signs of less inflammation and actually measuring slightly smaller compared to last time every other week over the past month and a half he has been measuring larger larger larger. Nonetheless I do believe that the issue has been inflammation the prednisone does seem to have been beneficial for him which is good news. No fevers, chills, nausea, vomiting, or diarrhea. 03/22/2019 on evaluation today patient appears to be doing about the same with regard to his leg ulcer. He has been tolerating the dressing changes without complication. With that being said the wound seems to be mostly arrested at its current size but really is not making any progress except for when we prescribed the prednisone. He did show some signs of dropping as far as the overall size of the wound during that interval week. Nonetheless this is something he is not on long-term at this point and unfortunately I think he is getting need either this or else the Humira which his dermatologist has discussed try to get approval for. With that being said he will be seeing his dermatologist on the 11th of this month that is November. 04/19/2019 on evaluation today patient appears to be doing really about the same the wound is measuring slightly larger compared to last time I saw him.  He has not been into the office since November 2 due to the fact that he unfortunately had Covid as that his entire family. He tells me that it was rough but they did pull-through and he seems to be doing much better. Fortunately there is no signs of active infection at this time.  No fevers, chills, nausea, vomiting, or diarrhea. 05/10/2019 on evaluation today patient unfortunately appears to be doing significantly worse as compared to last time I saw him. He does tell me that he has had his first dose of Humira and actually is scheduled to get the next one in the upcoming week. With that being said he tells me also that in the past several days he has been having a lot of issues with green drainage she showed me a picture this is more blue-green in color. He is also been having issues with increased sloughy buildup and the wound does appear to be larger today. Obviously this is not the direction that we want everything to take based on the starting of his Humira. Nonetheless I think this is definitely a result of likely infection and to be honest I think this is probably Pseudomonas causing the infection based on what I am seeing. 05/24/2019 on evaluation today patient unfortunately appears to be doing significantly worse compared to his prior evaluation with me 2 weeks ago. I did review his culture results which showed that he does have Staph aureus as well as Pseudomonas noted on the culture. Nonetheless the Levaquin that I prescribed for him does not appear to have been appropriate and in fact he tells me he is no longer experiencing the green drainage and discharge that he had at the last visit. Fortunately there is no signs of active infection at this time which is good news although the wound has significantly worsened it in fact is much deeper than it was previous. We have been utilizing up to this point triamcinolone ointment as the prescription topical of choice but at this time I really feel like that the wound is getting need to be packed in order to appropriately manage this due to the deeper nature of the wound. Therefore something along the lines of an alginate dressing may be more appropriate. 05/31/2019 upon inspection today patient's wound actually showed signs  of doing poorly at this point. Unfortunately he just does not seem to be making any good progress despite what we have tried. He actually did go ahead and pick up the Cipro and start taking that as he was noticing more green drainage he had previously completed the Levaquin that I prescribed for him as well. Nonetheless he missed his appointment for the seventh last week on Wednesday with the wound care center and [facility] where his dermatologist referred him. Obviously I do think a second opinion would be helpful at this point especially in light of the fact that the patient seems to be doing so poorly despite the fact that we have tried everything that I really know how at this point. The only thing that ever seems to have helped him in the past is when he was on high doses of continual steroids that did seem to make a difference for him. 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 06/07/2019 upon evaluation today patient unfortunately  appears to be doing worse yet again with regard to his wound. In fact I am starting to question whether or not he may have a fluid pocket in the muscle at this point based on the bulging and the soft appearance to the central portion of the muscle area. There is not anything draining from the muscle itself at this time which is good news but nonetheless the wound is expanding. I am not really seeing any results of the Humira as far as overall wound progression based on what I am seeing at this point. The patient has been referred for second opinion with regard to his wound to the [facility] wound care center by his dermatologist which I definitely am not in opposition to. Unfortunately we tried multiple dressings in the past including collagen, alginate, and at one point even Hydrofera Blue. With that being said he  is never really used it for any significant amount of time due to the fact that he often complains of pain associated with these dressings and then will go back to either using the Santyl which she has done intermittently or more frequently the triamcinolone. He is also using his own compression stockings. We have wrapped him in the past but again that was something else that he really was not a big fan of. 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 He seemed to do much better when he was on the steroids although honestly I understand that the steroids are not necessarily the best medication to be on long-term obviously 06/14/2019 on evaluation today patient appears to be doing actually a little bit better with regard to the overall appearance with his leg. Unfortunately he does continue to have issues with what appears to be some fluid underneath the muscle although he did see the wound specialty center at [facility] last week their main goals were to see about infusion therapy in place of the Humira as they feel like that is not quite strong enough. They also recommended that we continue with the treatment otherwise as we are they felt like that was appropriate and they are okay with him continuing to follow-up here with [place] in that regard. With that being said they are also sending him to the vein specialist there to see about vein stripping and if that would be of benefit for him. Subsequently they also did not really address whether or not an ultrasound of the muscle area to see if there is anything that needs to be addressed here would be appropriate or not. For that reason I discussed  this with him last week I think we may proceed down that road at this point. 06/21/2019 upon evaluation today patient's wound actually appears to be doing slightly better compared to previous evaluations. I do believe that he has made a difference with regard to the progression here with the use of oral steroids. Again in the past has been the only thing that is really calm things down. He does tell me that from [facility] is gotten a good news from there that there are no further vein stripping that is necessary at this point. I do not  have that available for review today although the patient did relay this to me. He also did obtain and have the ultrasound of the wound completed which I did sign off on today. It does appear that there is no fluid collection under the muscle this is likely then just edematous tissue in general. That is also good news. Overall I still believe the inflammation is the main issue here. He did inquire about the possibility of a wound VAC again with the muscle protruding like it is I am not really sure whether the wound VAC is necessarily ideal or not. That is something we will have to consider although I do believe he may need compression wrapping to try to help with edema control which could potentially be of benefit. 06/28/2019 on evaluation today patient appears to be doing slightly better measurement wise although this is not terribly smaller he least seems to be trending towards that direction. With that being said he still seems to have purulent drainage noted in the wound bed at this time. He has been on Levaquin followed by Cipro over the past month. Unfortunately he still seems to have some issues with active infection at this time. I did perform a culture last week in order to evaluate and see if indeed there was still anything going on. Subsequently the culture did come back showing Pseudomonas which is consistent with the drainage has been having which is blue-green in  color. He also has had an odor that again was GREYCEN, FELTER ([id number]) somewhat consistent with Pseudomonas as well. Long story short it appears that the culture showed an intermediate finding with regard to how well the Cipro will work for the Pseudomonas infection. Subsequently being that he does not seem to be clearing up and at best what we are doing is just keeping this at [place] I think he may need to see infectious disease to discuss IV antibiotic options. 07/05/2019 upon evaluation today patient appears to be doing okay in regard to his leg ulcer. He has been tolerating the dressing changes at this point without complication. Fortunately there is no signs of active infection at this time which is good news. No fevers, chills, nausea, vomiting, or diarrhea. With that being said he does have an appointment with infectious disease tomorrow and his primary care on Wednesday. Again the reason for the infectious disease referral was due to the fact that he did not seem to be fully resolving with the use of oral antibiotics and therefore we were thinking that IV antibiotic therapy may be necessary secondary to the fact that there was an intermediate finding for how effective the Cipro may be. Nonetheless again he has been having a lot of purulent and even green drainage. Fortunately right now that seems to have calmed down over the past week with the reinitiation of the oral antibiotic. Nonetheless we will see what Dr. Megan Salon has to say. 07/12/2019 upon evaluation today patient appears to be doing about the same at this point in regard to his left lower extremity ulcer. Fortunately there is no signs of active infection at this time which is good news I do believe the Levaquin has been beneficial I did review Dr. Hale Bogus note and to be honest I agree that the patient's leg does appear to be doing better currently. What we found in the past as he does not seem to really completely resolve he will stop  the antibiotic and then subsequently things will revert back to having issues  with blue-green drainage, increased pain, and overall worsening in general. Obviously that is the reason I sent him back to infectious disease. 07/19/2019 upon evaluation today patient appears to be doing roughly the same in size there is really no dramatic improvement. He has started back on the Levaquin at this point and though he seems to be doing okay he did still have a lot of blue/green drainage noted on evaluation today unfortunately. I think that this is still indicative more likely of a Pseudomonas infection as previously noted and again he does see Dr. Megan Salon in just a couple of days. I do not know that were really able to effectively clear this with just oral antibiotics alone based on what I am seeing currently. Nonetheless we are still continue to try to manage as best we can with regard to the patient and his wound. I do think the wrap was helpful in decreasing the edema which is excellent news. No fevers, chills, nausea, vomiting, or diarrhea. 07/26/2019 upon evaluation today patient appears to be doing slightly better with regard to the overall appearance of the muscle there is no dark discoloration centrally. Fortunately there is no signs of active infection at this time. No fevers, chills, nausea, vomiting, or diarrhea. Patient's wound bed currently the patient did have an appointment with Dr. Megan Salon at infectious disease last week. With that being said Dr. Megan Salon the patient states was still somewhat hesitant about put him on any IV antibiotics he wanted [place] to repeat cultures today and then see where things go going forward. He does look like Dr. Megan Salon because of some improvement the patient did have with the Levaquin wanted [place] to see about repeating cultures. If it indeed grows the Pseudomonas again then he recommended a possibility of considering a PICC line placement and IV antibiotic therapy. He  plans to see the patient back in 1 to 2 weeks. 08/02/2019 upon evaluation today patient appears to be doing poorly with regard to his left lower extremity. We did get the results of his culture back it shows that he is still showing evidence of Pseudomonas which is consistent with the purulent/blue-green drainage that he has currently. Subsequently the culture also shows that he now is showing resistance to the oral fluoroquinolones which is unfortunate as that was really the only thing to treat the infection prior. I do believe that he is looking like this is going require IV antibiotic therapy to get this under control. Fortunately there is no signs of systemic infection at this time which is good news. The patient does see Dr. Megan Salon tomorrow. 08/09/2019 upon evaluation today patient appears to be doing better with regard to his left lower extremity ulcer in regard to the overall appearance. He is currently on IV antibiotic therapy. As ordered by Dr. Megan Salon. Currently the patient is on ceftazidime which she is going to take for the next 2 weeks and then follow-up for 4 to 5-week appointment with Dr. Megan Salon. The patient started this this past Friday symptoms have not for a total of 3 days currently in full. 08/16/2019 upon evaluation today patient's wound actually does show muscle in the base of the wound but in general does appear to be much better as far as the overall evidence of infection is concerned. In fact I feel like this is for the most part cleared up he still on the IV antibiotics he has not completed the full course yet but I think he is doing much better which is  excellent news. 08/23/2019 upon evaluation today patient appears to be doing about the same with regard to his wound at this point. He tells me that he still has pain unfortunately. Fortunately there is no evidence of systemic infection at this time which is great news. There is significant muscle protrusion. 09/13/19 upon  evaluation today patient appears to be doing about the same in regard to his leg unfortunately. He still has a lot of drainage coming from the ulceration there is still muscle exposed. With that being said the patient's last wound culture still showed an intermediate finding with regard to the Pseudomonas he still having the bluish/green drainage as well. Overall I do not know that the wound has completely cleared of infection at this point. Fortunately there is no signs of active infection systemically at this point which is good news. 09/20/2019 upon evaluation today patient's wound actually appears to be doing about the same based on what I am seeing currently. I do not see any signs of systemic infection he still does have evidence of some local infection and drainage. He did see Dr. Megan Salon last week and Dr. Megan Salon states that he probably does need a different IV antibiotic although he does not want to put him on this until the patient begins the Remicade infusion which is actually scheduled for about 10 days out from today on 13 May. Following that time Dr. Megan Salon is good to see him back and then will evaluate the feasibility of starting him on the IV antibiotic therapy once again at that point. I do not disagree with this plan I do believe as Dr. Megan Salon stated in his note that I reviewed today that the patient's issue is multifactorial with the pyoderma being 1 aspect of this that were hoping the Remicade will be helpful for her. In the meantime I think the gentamicin is, helping to keep things under decent okay control in regard to the ulcer. 09/27/2019 upon evaluation today patient appears to be doing about the same with regard to his wound still there is a lot of muscle exposure though he does have some hyper granulation tissue noted around the edge and actually some granulation tissue starting to form over the muscle which is actually good news. Fortunately there is no evidence of active  infection which is also good news. His pain is less at this point. 5/21; this is a patient I have not seen in a long time. 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 He has a large wound on the left lateral leg with protruding muscle. He comes in the clinic today showing the same area on his left medial ankle. He says there is been a spot there for some time although we have not previously defined this. Today he has a clearly defined area with slight amount of skin breakdown surrounded by raised areas with a purplish hue in color. This is not painful he says it is irritated. 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 He still has muscle protruding from the lateral portion of his left leg but fortunately the new area that they were concerned about at his last visit does not appear to have opened at this point. He is currently on Remicade infusions and seems to be doing better in my opinion in fact the wound itself seems to be overall much better. The purplish discoloration that he did have  seems to have resolved and I think that is a good sign that hopefully the Remicade is doing its job. He does have some biofilm noted over the surface of the wound. DAYSHON, ROBACK ([id number]) 11/01/2019 on evaluation today patient's wound actually appears to be doing excellent at this time. Fortunately there is no evidence of active infection and overall I feel like he is making great progress. The Remicade seems to be due excellent job in my opinion. 11/08/19 evaluation today vision actually appears to be doing quite well with regard to his weight ulcer. He's been tolerating dressing changes without complication. Fortunately there is no evidence of infection. No fevers, chills, nausea, or vomiting noted at this time. Overall states that is having more itching than pain which  is actually a good sign in my opinion. 12/13/2019 upon evaluation today patient appears to be doing well today with regard to his wound. He has been tolerating the dressing changes without complication. Fortunately there is no sign of active infection at this time. No fevers, chills, nausea, vomiting, or diarrhea. Overall I feel like the infusion therapy has been very beneficial for him. 01/06/2020 on evaluation today patient appears to be doing well with regard to his wound. This is measuring smaller and actually looks to be doing better. Fortunately there is no signs of active infection at this point. No fevers, chills, nausea, vomiting, or diarrhea. With that being said he does still have the blue-green drainage but this does not seem to be causing any significant issues currently. He has been using the gentamicin that does seem to be keeping things under decent control at this point. He goes later this morning for his next infusion therapy for the pyoderma which seems to also be very beneficial. 02/07/2020 on evaluation today patient appears to be doing about the same in regard to his wounds currently. Fortunately there is no signs of active infection systemically he does still have evidence of local infection still using gentamicin. He also is showing some signs of improvement albeit slowly I do feel like we are making some progress here. 02/21/2020 upon evaluation today patient appears to be making some signs of improvement the wound is measuring a little bit smaller which is great news and overall I am very pleased with where he stands currently. He is going to be having infusion therapy treatment on the 15th of this month. Fortunately there is no signs of active infection at this time. 03/13/2020 I do believe patient's wound is actually showing some signs of improvement here which is great news. He has continue with the infusion therapy through rheumatology/dermatology at [facility]. That does seem to  be beneficial. I still think he gets as much benefit from this as he did from the prednisone initially but nonetheless obviously this is less harsh on his body that the prednisone as far as they are concerned. 03/31/2020 on evaluation today patient's wound actually showing signs of some pretty good improvement in regard to the overall appearance of the wound bed. There is still muscle exposed though he does have some epithelial growth around the edges of the wound. Fortunately there is no signs of active infection at this time. No fevers, chills, nausea, vomiting, or diarrhea. 04/24/2020 upon evaluation today patient appears to be doing about the same in regard to his leg ulcer. He has been tolerating the dressing changes without complication. Fortunately there is no signs of active infection at this time. No fevers, chills, nausea, vomiting, or diarrhea. With that  being said he still has a lot of irritation from the bandaging around the edges of the wound. We did discuss today the possibility of a referral to plastic surgery. 05/22/2020 on evaluation today patient appears to be doing well with regard to his wounds all things considered. He has not been able to get the Chantix apparently there is a recall nurse that I was unaware of put out by [employer] involuntarily. Nonetheless for now I am and I have to do some research into what may be the best option for him to help with quitting in regard to smoking and we discussed that today. 06/26/2020 upon evaluation today patient appears to be doing well with regard to his wound from the standpoint of infection I do not see any signs of infection at this point. With that being said unfortunately he is still continuing to have issues with muscle exposure and again he is not having a whole lot of new skin growth unfortunately. There does not appear to be any signs of active infection at this time. No fevers, chills, nausea, vomiting, or diarrhea. 07/10/2020 upon  evaluation today patient appears to be doing a little bit more poorly currently compared to where he was previous. I am concerned currently about an active infection that may be getting worse especially in light of the increased size and tenderness of the wound bed. No fevers, chills, nausea, vomiting, or diarrhea. 07/24/2020 upon evaluation today patient appears to be doing poorly in regard to his leg ulcer. He has been tolerating the dressing changes without complication but unfortunately is having a lot of discomfort. 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 Subsequently I think he is going require possibly IV antibiotics to get this under control. I am very concerned about the severity of his infection and the amount of discomfort he is having. 07/31/2020 upon evaluation today patient appears to be doing about the same in regard to his leg wound. He did see Dr. Megan Salon and Dr. Megan Salon is actually going to start him on IV antibiotics. He goes for the PICC line tomorrow. With that being said there do not have that run for 2 weeks and then see how things are doing and depending on how he is progressing they may extend that a little longer. Nonetheless I am glad this is getting ready to be in place and definitely feel it may help the patient. In the meantime is been using mainly triamcinolone to the wound bed has an anti-inflammatory. 08/07/2020 on evaluation today patient appears to be doing well with regard to his wound compared even last week. In the interim he has gotten the PICC line placed and overall this seems to be doing excellent. There does not appear to be any evidence of infection which is great news systemically although locally of course has had the infection this appears to be improving with the use of the antibiotics. 08/14/2020 upon evaluation today patient's wound actually showing signs of excellent improvement.  Overall the irritation has significantly improved the drainage is back down to more of a normal level and his pain is really pretty much nonexistent compared to what it was. Obviously I think that this is significantly improved secondary to the IV antibiotic therapy which has made all the difference in the world. Again he had a resistant form of Pseudomonas for which oral antibiotics just was not cutting it. Nonetheless I do think that still we need to consider the  possibility of a surgical closure for this wound is been open so long and to be honest with muscle exposed I think this can be very hard to get this to close outside of this although definitely were still working to try to do what we can in that regard. 08/21/2020 upon evaluation today patient appears to be doing very well with regard to his wounds on the left lateral lower extremity/calf area. Fortunately there does not appear to be signs of active infection which is great news and overall very pleased with where things stand today. He is actually wrapping up his treatment with IV antibiotics tomorrow. After that we will see where things go from there. 08/28/2020 upon evaluation today patient appears to be doing decently well with regard to his leg ulcer. There does not appear to be any signs of active infection which is great news and overall very pleased with where things stand today. No fevers, chills, nausea, vomiting, or diarrhea. [name], [name] ([id number]) 09/18/2020 upon evaluation today patient appears to be doing well with regard to his infection which I feel like is better. Unfortunately he is not doing as well with regard to the overall size of the wound which is not nearly as good at this point. I feel like that he may be having an issue here with the pyoderma being somewhat out of control. I think that he may benefit from potentially going back and talking to the dermatologist about what to do from the pyoderma standpoint. I am not  certain if the infusions are helping nearly as much is what the prednisone did in the past. 10/02/2020 upon evaluation today patient appears to be doing well with regard to his leg ulcer. He did go to the [facility] nurse. Unfortunately they feel like there is a 10% chance that most that he would be able to heal and that the skin graft would take. Obviously this has led him to not be able to go down that path as far as treatment is concerned. Nonetheless he does seem to be doing a little bit better with the prednisone that I gave him last time. I think that he may need to discuss with dermatology the possibility of long-term prednisone as that seems to be what is most helpful for him to be perfectly honest. I am not sure the Remicade is really doing the job. 10/17/2020 upon evaluation today patient appears to be doing a little better in regard to his wound. In fact the case has been since we did the prednisone on May 2 for him that we have noticed a little bit of improvement each time we have seen a size wise as well as appearance wise as well as pain wise. I think the prednisone has had a greater effect then the infusion therapy has to be perfectly honest. With that being said the patient also feels significantly better compared to what he was previous. All of this is good news but nonetheless I am still concerned about the fact that again we are really not set up to long-term manage him as far as prednisone is concerned. Obviously there are things that you need to be watched I completely understand the risk of prednisone usage as well. 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 With all that being said I do believe that we can give him another round of the prednisone which she is requesting today because of the improvement that he  seen since we did that first round. 10/30/2020 upon evaluation today patient's wound actually is showing signs of doing quite  well. There does not appear to be any evidence of infection which is great news and overall very pleased with where things stand today. No fevers, chills, nausea, vomiting, or diarrhea. He tells me that the prednisone still has seem to have helped he wonders if we can extend that for just a little bit longer. He did not have the appointment with a dermatologist although he did have an infusion appointment last Friday. That was at [facility]. With that being said he tells me he could not do both that as well as the appointment with the physician on the same day therefore that is can have to be rescheduled. I really want to see if there is anything they feel like that could be done differently to try to help this out as I am not really certain that the infusions are helping significantly here. 11/13/2020 upon evaluation today patient unfortunately appears to be doing somewhat poorly in regard to his wound I feel like this is actually worsening from the standpoint of the pyoderma spreading. I still feel like that he may need something different as far as trying to manage this going forward. Again we did the prednisone unfortunately his blood sugars are not doing so well because of this. Nonetheless I believe that the patient likely needs to try topical steroid. We have done triamcinolone for a while I think going with something stronger such as clobetasol could be beneficial again this is not something I do lightly I discussed this with the patient that again this does not normally put underneath an occlusive dressing. Nonetheless I think a thin film as such could help with some of the stronger anti-inflammatory effects. We discussed this today. He would like to try to give this a trial for the next couple weeks. I definitely think that is something that we can do. Evaluate7/03/2021 and today patient's wound bed actually showed signs of doing really about the same. There was a little expansion of the size of the  wound and that leading edge that we done looking out although the clobetasol does seem to have slowed this down a bit in my opinion. There is just 1 small area that still seems to be progressing based on what I see. Nonetheless I am concerned about the fact this does not seem to be improving if anything seems to be doing a little bit worse. I do not know that the infusions are really helping him much as next infusion is August 5 his appointment with dermatology is July 25. Either way I really think that we need to have a conversation potentially about this and I am actually going to see if I can talk with Dr. Lillia Carmel in order to see where things stand as well. 12/11/2020 upon evaluation today patient appears to be doing worse in regard to his leg ulcer. Unfortunately I just do not think this is making the progress that I would like to see at this point. Honestly he does have an appointment with dermatology and this is in 2 days. I am wondering what they may have to offer to help with this. Right now what I am seeing is that he is continuing to show signs of worsening little by little. Obviously that is not great at all. Is the exact opposite of what we are looking for. 12/18/2020 upon evaluation today patient appears to be doing  a little better in regard to his wound. The dermatologist actually did do some steroid injections into the wound which does seem to have been beneficial in my opinion. That was on the 25th already this looks a little better to me than last time I saw him. With that being said we did do a culture and this did show that he has Staph aureus noted in abundance in the wound. With that being said I do think that getting him on an oral antibiotic would be appropriate as well. Also think we can compression wrap and this will make a difference as well. 12/28/2020 upon evaluation today patient's wound is actually showing signs of doing much better. I do believe the compression wrap is  helping he has a lot of drainage but to be honest I think that the compression is helping to some degree in this regard as well as not draining through which is also good news. No fevers, chills, nausea, vomiting, or diarrhea. 01/04/2021 upon evaluation today patient appears to be doing well with regard to his wound. Overall things seem to be doing quite well. He did have a little bit of reaction to the CarboFlex Sorbact he will be using that any longer. With that being said he is controlled as far as the drainage is concerned overall and seems to be doing quite well. I do not see any signs of active infection at this time which is great news. No fevers, chills, nausea, vomiting, or diarrhea. 01/11/2021 upon evaluation today patient appears to be doing well with regard to his wounds. He has been tolerating the dressing changes without complication. Fortunately there does not appear to be any signs of active infection at this time which is great news. Overall I am extremely pleased with where we stand currently. No fevers, chills, nausea, vomiting, or diarrhea. Where using clobetasol in the wound bed he has a lot of new skin growth which is awesome as well. 01/18/2021 upon evaluation today patient appears to be doing very well in regard to his leg ulcer. He has been tolerating the dressing changes without complication. Fortunately there does not appear to be any signs of active infection which is great news. In general I think that he is making excellent progress 01/25/2021 upon evaluation today patient appears to be doing well with regard to his wound on the leg. I am actually extremely pleased with where things stand today. There does not appear to be any signs of active infection which is great news and overall I think that we are definitely headed in the appropriate direction based on what I am seeing currently. There does not appear to be any signs of active infection also excellent news. 02/06/2021  upon evaluation today patient appears to be doing well with regard to his wound. Overall visually this is showing signs of significant improvement which is great news. I do not see any signs of active infection systemically which is great even locally I do not think that we are [name], [name] ([id number]) seeing any major complications here. We did do fluorescence imaging with the [employer] DX today. The patient does have some odor and drainage noted and again this is something that I think would benefit him to probably come more frequently for nurse visits. 02/19/2021 upon evaluation today patient actually appears to be doing quite well in regard to his wound. He has been tolerating the dressing changes without complication and overall I think that this is making excellent progress. I do  not see any evidence of active infection at this point which is great news as well. No fevers, chills, nausea, vomiting, or diarrhea. 10/10; wound is made nice progress healthy granulation with a nice rim of epithelialization which seems to be expanding even from last week he has a deeper area in the inferior part of the more distal part of the wound with not quite as healthy as surface. This area will need to be followed. Using clobetasol and Hydrofera Blue Electronic Signature(s) Signed: 02/26/2021 5:14:01 PM By: Linton Ham MD Entered By: Linton Ham on 02/26/2021 08:36:48 [name], [name] ([id number]) -------------------------------------------------------------------------------- Physical Exam Details Patient Name: [name] Date of Service: 02/26/2021 8:15 AM Medical Record Number: [id number] Patient Account Number: [id number] Date of Birth/Sex: 08-Aug-1980 (40 y.o. M) Treating RN: Dolan Amen Primary Care Provider: [name] Other Clinician: Referring Provider: [name] Treating Provider/Extender: Tito Dine in Treatment: 220 Constitutional Sitting or standing  Blood Pressure is within target range for patient.. Pulse regular and within target range for patient.Marland Kitchen Respirations regular, non- labored and within target range.. Temperature is normal and within the target range for the patient.Marland Kitchen appears in no distress. Cardiovascular Pedal pulses palpable.. Notes Wound exam; left lateral lower calf. Decent granulation albeit a little dry. However nice rim of epithelialization. There is a slightly deeper area with not quite as viable surface distally. At this point I think this area is going to need to be followed. Mechanical debridement relatively contraindicated Electronic Signature(s) Signed: 02/26/2021 5:14:01 PM By: Linton Ham MD Entered By: Linton Ham on 02/26/2021 08:38:02 LEONIDES, MINDER ([id number]) -------------------------------------------------------------------------------- Physician Orders Details Patient Name: [name] Date of Service: 02/26/2021 8:15 AM Medical Record Number: [id number] Patient Account Number: [id number] Date of Birth/Sex: Jul 29, 1980 (40 y.o. 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 o Nurse Visit as needed - twice a week Bathing/ Shower/ Hygiene o Clean wound with Normal Saline or wound cleanser. o Other: - Apply dakins soaked gauze on wound after cleansing wound IN OFFICE  ONLY Edema Control - Lymphedema / Segmental Compressive Device / Other o Optional: One layer of unna paste to top of compression wrap (to act as an anchor). o Elevate, Exercise Daily and Avoid Standing for Long Periods of Time. o Elevate legs to the level of the heart and pump ankles as often as possible o Elevate leg(s) parallel to the floor when sitting. Wound Treatment Wound #1 - Lower Leg Wound Laterality: Left, Lateral Cleanser: Wound Cleanser 3 x Per Week/30 Days Discharge Instructions: Wash your hands with soap and water. Remove old dressing, discard into plastic bag and place into trash. Cleanse the wound with Wound Cleanser prior to applying a clean dressing using gauze sponges, not tissues or cotton balls. Do not scrub or use excessive force. Pat dry using gauze sponges, not tissue or cotton balls. Peri-Wound Care: Moisturizing Lotion 3 x Per Week/30 Days Discharge Instructions: Suggestions: Theraderm, Eucerin, Cetaphil, or patient preference. Topical: Clobetasol Propionate ointment 0.05%, 60 (g) tube 3 x Per Week/30 Days Discharge Instructions: Apply  to 7 o'clock depth Primary Dressing: Hydrofera Blue Ready Transfer Foam, 4x5 (in/in) 3 x Per Week/30 Days Discharge Instructions: Apply Hydrofera Blue Ready to wound bed as directed Secondary Dressing: Xtrasorb Medium 4x5 (in/in) 3 x Per Week/30 Days Discharge Instructions: Apply to wound as directed. Do not cut. Compression Wrap: Medichoice 4 layer Compression System, 35-40 mmHG (Generic) 3 x Per Week/30 Days Discharge Instructions: Apply multi-layer wrap as directed. Electronic Signature(s) Signed: 02/26/2021 5:03:00 PM By: Dolan Amen RN Signed: 02/26/2021 5:14:01 PM By: Linton Ham MD Entered By: Dolan Amen on 02/26/2021 08:47:48 [name], [name] ([id number]) -------------------------------------------------------------------------------- Problem List Details Patient Name: [name] Date of Service:  02/26/2021 8:15 AM Medical Record Number: [id number] Patient Account Number: [id number] Date of Birth/Sex: [birth year] (40 y.o. 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 to breakdown of skin 10/08/2019 No Yes I87.2 Venous insufficiency (chronic) (peripheral) 12/04/2016 No Yes L03.116 Cellulitis of left lower limb 05/24/2019 No Yes F17.208 Nicotine dependence, unspecified, with other nicotine-induced disorders 04/24/2020 No Yes Inactive Problems ICD-10 Code Description Active Date Inactive Date L97.213 Non-pressure chronic ulcer of right calf with necrosis of muscle 04/02/2017 04/02/2017 Resolved Problems Electronic Signature(s) Signed: 02/26/2021 5:14:01 PM By: Linton Ham MD Entered By: Linton Ham on 02/26/2021 08:35:37 Svehla, [name] ([id number]) -------------------------------------------------------------------------------- Progress Note Details Patient Name: [name] Date of Service: 02/26/2021 8:15 AM Medical Record Number: [id number] Patient Account Number: [id number] Date of Birth/Sex: [birth year] (40 y.o. M) Treating RN: Dolan Amen Primary Care Provider: [name] Other Clinician: Referring Provider: [name] Treating Provider/Extender: Tito Dine in Treatment: 220 Subjective History of Present Illness (HPI) 12/04/16; 40 year old man who comes into the clinic today for review of a wound on the posterior left calf. He tells me that is been there for about a year. He is not a diabetic he does smoke half a pack per day. He was seen  in the ER on 11/20/16 felt to have cellulitis around the wound and was given clindamycin. An x-ray did not show osteomyelitis. The patient initially tells me that he has a milk allergy that sets off a pruritic itching rash on his lower legs which she scratches incessantly and he thinks that's what may have set up the wound. He has been using various topical antibiotics and ointments without any effect. He works in a [employer] and is on his feet all day. He does not have a prior history of wounds however he does have the rash on both lower legs the right arm and the ventral aspect of his left arm. These are excoriations and clearly have had scratching however there are of macular looking areas on both legs including a substantial larger area on the right leg. This does not have an underlying open area. There is no blistering. The patient tells me that 2 years ago in [place] in response to the rash on his legs he saw a dermatologist who told him he had a condition which may be pyoderma gangrenosum although I may be putting words into his mouth. He seemed to recognize this. On further questioning he admits to a 5 year history of quiesced. ulcerative colitis. He is not in any treatment for this.  He's had no recent travel 12/11/16; the patient arrives today with his wound and roughly the same condition we've been using silver alginate this is a deep punched out wound with some surrounding erythema but no tenderness. Biopsy I did did not show confirmed pyoderma gangrenosum suggested nonspecific inflammation and vasculitis but does not provide an actual description of what was seen by the pathologist. I'm really not able to understand this We have also received information from the patient's dermatologist in [place] notes from April 2016. This was a doctor Agarwal-antal. 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 He was prescribed topical steroid high potency under occlusion which helped but at  this point the patient did not have a deep punched out wound. 12/18/16; the patient's wound is larger in terms of surface area however this surface looks better and there is less depth. The surrounding erythema also is better. The patient states that the wrap we put on came off 2 days ago when he has been using his compression stockings. He we are in the process of getting a dermatology consult. 12/26/16 on evaluation today patient's left lower extremity wound shows evidence of infection with surrounding erythema noted. He has been tolerating the dressing changes but states that he has noted more discomfort. There is a larger area of erythema surrounding the wound. No fevers, chills, nausea, or vomiting noted at this time. With that being said the wound still does have slough covering the surface. He is not allergic to any medication that he is aware of at this point. In regard to his right lower extremity he had several regions that are erythematous and pruritic he wonders if there's anything we can do to help that. 01/02/17 I reviewed patient's wound culture which was obtained his visit last week. He was placed on doxycycline at that point. Unfortunately that does not appear to be an antibiotic that would likely help with the situation however the pseudomonas noted on culture is sensitive to Cipro. Also unfortunately patient's wound seems to have a large compared to last week's evaluation. Not severely so but there are definitely increased measurements in general. He is continuing to have discomfort as well he writes this to be a seven out of 10. In fact he would prefer me not to perform any debridement today due to the fact that he is having discomfort and considering he has an active infection on the little reluctant to do so anyway. No fevers, chills, nausea, or vomiting noted at this time. 01/08/17; patient seems dermatology on September 5. I suspect dermatology will want the slides from the biopsy I  did sent to their pathologist. I'm not sure if there is a way we can expedite that. In any case the culture I did before I left on vacation 3 weeks ago showed Pseudomonas he was given 10 days of Cipro and per her description of her intake nurses is actually somewhat better this week although the wound is quite a bit bigger than I remember the last time I saw this. He still has 3 more days of Cipro 01/21/17; dermatology appointment tomorrow. He has completed the ciprofloxacin for Pseudomonas. Surface of the wound looks better however he is had some deterioration in the lesions on his right leg. Meantime the left lateral leg wound we will continue with sample 01/29/17; patient had his dermatology appointment but I can't yet see that note. He is completed his antibiotics. The wound is more superficial but considerably larger in circumferential area than  when he came in. This is in his left lateral calf. He also has swollen erythematous areas with superficial wounds on the right leg and small papular areas on both arms. There apparently areas in her his upper thighs and buttocks I did not look at those. Dermatology biopsied the right leg. Hopefully will have their input next week. 02/05/17; patient went back to see his dermatologist who told him that he had a "scratching problem" as well as staph. He is now on a 30 day course of doxycycline and I believe she gave him triamcinolone cream to the right leg areas to help with the itching [not exactly sure but probably triamcinolone]. She apparently looked at the left lateral leg wound although this was not rebiopsied and I think felt to be ultimately part of the same pathogenesis. He is using sample border foam and changing nevus himself. He now has a new open area on the right posterior leg which was his biopsy site I don't have any of the dermatology notes 02/12/17; we put the patient in compression last week with SANTYL to the wound on the left leg and the  biopsy. Edema is much better and the depth of the wound is now at level of skin. Area is still the same Biopsy site on the right lateral leg we've also been using santyl with a border foam dressing and he is changing this himself. 02/19/17; Using silver alginate started last week to both the substantial left leg wound and the biopsy site on the right wound. He is tolerating compression well. Has a an appointment with his primary M.D. tomorrow wondering about diuretics although I'm wondering if the edema problem is actually lymphedema 02/26/17; the patient has been to see his primary doctor Dr. Jerrel Ivory at [facility] our primary [facility]. She started him on Lasix 20 mg and this seems to have helped with the edema. However we are not making substantial change with the left lateral calf wound and inflammation. The biopsy site on the right leg also looks stable but not really all that different. 03/12/17; the patient has been to see vein and vascular Dr. Lucky Cowboy. He has had venous reflux studies I have not reviewed these. I did get a call from his dermatology office. They felt that he might have pathergy based on their biopsy on his right leg which led them to look at the slides of [name], [name] ([id number]) the biopsy I did on the left leg and they wonder whether this represents pyoderma gangrenosum which was the original supposition in a man with ulcerative colitis albeit inactive for many years. They therefore recommended clobetasol and tetracycline i.e. 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 03/26/17; apparently the patient just had reflux studies not an appointment with Dr. dew. She arrives in clinic today having applied clobetasol for 2-3 weeks. He notes over the last 2-3 days excessive drainage having to change the dressing 3-4 times a day and also expanding erythema. He states the expanding erythema seems to come and go and was last this red was earlier in the month.he is on doxycycline  150 mg twice a day as an anti-inflammatory systemic therapy for possible pyoderma gangrenosum along with the topical clobetasol 04/02/17; the patient was seen last week by Dr. Lillia Carmel at [facility] dermatology locally who kindly saw him at my request. 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 My concern was the degree of erythema medially extending from his left leg wound  which was either inflammation from pyoderma or cellulitis. I put him on Augmentin however culture of the wound showed Pseudomonas which is quinolone sensitive. I really don't believe he has cellulitis however in view of everything I will continue and give him a course of Cipro. He is also on doxycycline as an immune modulator for the pyoderma. In addition to his original wound on the left lateral leg with surrounding erythema he has a wound on the right posterior calf which was an original biopsy site done by dermatology. 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 Saw Dr. Lillia Carmel yesterday. He has been using topical antibiotics to both wound areas his original wound on the left and the biopsies/pathergy area on the right. There is definitely some improvement in the inflammation around the wound on the right although the patient states he has increasing sensitivity of the wounds. He is on prednisone 60 and doxycycline 1 as prescribed by Dr. Lillia Carmel. He is covering the topical antibiotic with gauze and putting this in his own compression stocks and changing this daily. He states that Dr. Lottie Rater did a culture of the left leg wound yesterday 05/07/17; pyoderma gangrenosum. The patient saw Dr. Lillia Carmel yesterday and has a follow-up with her in one month. He is still using topical antibiotics to both wounds although he can't recall exactly what type. He is still on prednisone 60 mg. Dr. Lillia Carmel stated that the  doxycycline could stop if we were in agreement. 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 The right medial leg was induced by biopsy/pathergy. The area on the right is essentially healed. 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 The right medial leg has closed and remains closed. He is still on prednisone 60. He tells me he missed his last dermatology appointment with Dr. Lillia Carmel but will make another appointment. He reports that her blood sugar at a recent screen in [place] was high 200's. He was 180 today. 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 I think he is going to require still much longer prednisone perhaps another 3 months before attempting to taper. In the meantime his wound is a lot better. Smaller. He is cleaning this off daily and applying topical antibiotics. When he was last in the clinic I thought about changing to [facility] and actually put in a couple of calls to dermatology although probably not during their business hours. 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 He continues on oral prednisone. He has been using triple antibiotic ointment. There is surface debris and we will transition to [facility] and have him return in 2 weeks. He has lost 30 pounds since his last appointment with lifestyle modification. He may benefit from topical steroid cream for treatment this can be considered at a later date. 08/22/17 on evaluation today patient appears to actually be doing rather well in regard to his left lateral lower extremity ulcer. He has actually been managed by Dr. Dellia Nims most recently. Patient is currently on oral steroids at this time. This seems  to have been of benefit for him. Nonetheless his last visit was actually with Leah on 08/06/17. Currently he is  not utilizing any topical steroid creams although this could be of benefit as well. No fevers, chills, nausea, or vomiting noted at this time. 09/05/17 on evaluation today patient appears to be doing better in regard to his left lateral lower extremity ulcer. He has been tolerating the dressing changes without complication. He is using Santyl with good effect. Overall I'm very pleased with how things are standing at this point. Patient likewise is happy that this is doing better. 09/19/17 on evaluation today patient actually appears to be doing rather well in regard to his left lateral lower extremity ulcer. 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 He's not having any significant pain. 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 He has been tolerating the Santyl without complication and in general I feel like he's making good progress. 10/17/17 on evaluation today patient appears to be doing very well in regard to his left lateral lower surety ulcer. He has been tolerating the dressing changes without complication. There does not appear to be any evidence of infection he's alternating the Santyl and the triple antibiotic ointment every other day this seems to be doing well for him. 11/03/17 on evaluation today patient appears to be doing very well in regard to his left lateral lower extremity ulcer. He is been tolerating the dressing changes without complication which is good news. Fortunately there does not appear to be any evidence of infection which is also great news. Overall is doing excellent they are starting to taper down on the prednisone is down to 40 mg at this point it also started topical  clobetasol for him. 11/17/17 on evaluation today patient appears to be doing well in regard to his left lateral lower surety ulcer. He's been tolerating the dressing changes without complication. He does note that he is having no pain, no excessive drainage or discharge, and overall he feels like things are going about how he would expect and hope they would. Overall he seems to have no evidence of infection at this time in my opinion which is good news. 12/04/17-He is seen in follow-up evaluation for right lateral lower extremity ulcer. He has been applying topical steroid cream. Today's measurement show slight increase in size. Over the next 2 weeks we will transition to every other day Santyl and steroid cream. He has been encouraged to monitor for changes and notify clinic with any concerns 12/15/17 on evaluation today patient's left lateral motion the ulcer and fortunately is doing worse again at this point. This just since last week to this week has close to doubled in size according to the patient. I did not seeing last week's I do not have a visual to compare this to in our system was also down so we do not have all the charts and at this point. Nonetheless it does have me somewhat concerned in regard to the fact that again he was worried enough about it he has contact the dermatology that placed them back on the full strength, 50 mg a day of the prednisone that he was taken previous. He continues to alternate using clobetasol along with Santyl at this point. He is obviously somewhat frustrated. 12/22/17 on evaluation today patient appears to be doing a little worse compared to last evaluation. Unfortunately the wound is a little deeper and slightly larger than  the last week's evaluation. With that being said he has made some progress in regard to the irritation surrounding at this time unfortunately despite that progress that's been made he still has a significant issue going on here. I'm not  certain that he is having really any true infection at this time although with the Pyoderma gangrenosum it can sometimes be difficult to differentiate infection versus just inflammation. [name], [name] ([id number]) For that reason I discussed with him today the possibility of perform a wound culture to ensure there's nothing overtly infected. 01/06/18 on evaluation today patient's wound is larger and deeper than previously evaluated. With that being said it did appear that his wound was infected after my last evaluation with him. Subsequently I did end up prescribing a prescription for Bactrim DS which she has been taking and having no complication with. Fortunately there does not appear to be any evidence of infection at this point in time as far as anything spreading, no want to touch, and overall I feel like things are showing signs of improvement. 01/13/18 on evaluation today patient appears to be even a little larger and deeper than last time. There still muscle exposed in the base of the wound. Nonetheless he does appear to be less erythematous I do believe inflammation is calming down also believe the infection looks like it's probably resolved at this time based on what I'm seeing. No fevers, chills, nausea, or vomiting noted at this time. 01/30/18 on evaluation today patient actually appears to visually look better for the most part. Unfortunately those visually this looks better he does seem to potentially have what may be an abscess in the muscle that has been noted in the central portion of the wound. This is the first time that I have noted what appears to be fluctuance in the central portion of the muscle. With that being said I'm somewhat more concerned about the fact that this might indicate an abscess formation at this location. I do believe that an ultrasound would be appropriate. This is likely something we need to try to do as soon as possible. He has been switch to mupirocin ointment  and he is no longer using the steroid ointment as prescribed by dermatology he sees them again next week he's been decreased from 60 to 40 mg of prednisone. 03/09/18 on evaluation today patient actually appears to be doing a little better compared to last time I saw him. There's not as much erythema surrounding the wound itself. He I did review his most recent infectious disease note which was dated 02/24/18. He saw Dr. Michel Bickers in [place]. 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 Patient is now off of antibiotics and they are continually observing this. There seems to be no change in the past two weeks in my pinion based on what the patient says and what I see today compared to what Dr. Megan Salon likely saw two weeks ago. No fevers, chills, nausea, or vomiting noted at this time. 03/23/18 on evaluation today patient's wound actually appears to be showing signs of improvement which is good news. He is currently still on the Dapsone. He is also working on tapering the prednisone to get off of this and Dr. Lottie Rater is working with him in this regard. Nonetheless overall I feel like the wound is doing well it does appear based on the infectious disease note that I reviewed  from Dr. 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 I did also review the results of his ultrasound of left lower extremity which revealed there was a dentist tissue in the base of the wound without an abscess noted. 04/06/18 on evaluation today the patient's left lateral lower extremity ulcer actually appears to be doing fairly well which is excellent news. There does not appear to be any evidence of infection at this time which is also great news. Overall he still does have a significantly large ulceration although little by  little he seems to be making progress. He is down to 10 mg a day of the prednisone. 04/20/18 on evaluation today patient actually appears to be doing excellent at this time in regard to his left lower extremity ulcer. He's making signs of good progress unfortunately this is taking much longer than we would really like to see but nonetheless he is making progress. Fortunately there does not appear to be any evidence of infection at this time. No fevers, chills, nausea, or vomiting noted at this time. The patient has not been using the Santyl due to the cost he hadn't got in this field yet. He's mainly been using the antibiotic ointment topically. Subsequently he also tells me that he really has not been scrubbing in the shower I think this would be helpful again as I told him it doesn't have to be anything too aggressive to even make it believe just enough to keep it free of some of the loose slough and biofilm on the wound surface. 05/11/18 on evaluation today patient's wound appears to be making slow but sure progress in regard to the left lateral lower extremity ulcer. He is been tolerating the dressing changes without complication. Fortunately there does not appear to be any evidence of infection at this time. He is still just using triple antibiotic ointment along with clobetasol occasionally over the area. He never got the Santyl and really does not seem to intend to in my pinion. 06/01/18 on evaluation today patient appears to be doing a little better in regard to his left lateral lower extremity ulcer. He states that overall he does not feel like he is doing as well with the Dapsone as he did with the prednisone. Nonetheless he sees his dermatologist later today and is gonna talk to them about the possibility of going back on the prednisone. Overall again I believe that the wound would be better if you would utilize Santyl but he really does not seem to be interested in going back to the [place] at  this point. He has been using triple antibiotic ointment. 06/15/18 on evaluation today patient's wound actually appears to be doing about the same at this point. Fortunately there is no signs of infection at this time. He has made slight improvements although he continues to not really want to clean the wound bed at this point. He states that he just doesn't mess with it he doesn't want to cause any problems with everything else he has going on. He has been on medication, antibiotics as prescribed by his dermatologist, for a staff infection of his lower extremities which is really drying out now and looking much better he tells me. Fortunately there is no sign of overall infection. 06/29/18 on evaluation today patient appears to be doing well in regard to his left lateral lower surety ulcer all  things considering. Fortunately his staff infection seems to be greatly improved compared to previous. He has no signs of infection and this is drying up quite nicely. He is still the doxycycline for this is no longer on cental, Dapsone, or any of the other medications. His dermatologist has recommended possibility of an infusion but right now he does not want to proceed with that. 07/13/18 on evaluation today patient appears to be doing about the same in regard to his left lateral lower surety ulcer. Fortunately there's no signs of infection at this time which is great news. Unfortunately he still builds up a significant amount of Slough/biofilm of the surface of the wound he still is not really cleaning this as he should be appropriately. Again I'm able to easily with saline and gauze remove the majority of this on the surface which if you would do this at home would likely be a dramatic improvement for him as far as getting the area to improve. Nonetheless overall I still feel like he is making progress is just very slow. I think Santyl will be of benefit for him as well. Still he has not gotten this as of this  point. 07/27/18 on evaluation today patient actually appears to be doing little worse in regards of the erythema around the periwound region of the wound he also tells me that he's been having more drainage currently compared to what he was experiencing last time I saw him. He states not quite as bad as what he had because this was infected previously but nonetheless is still appears to be doing poorly. Fortunately there is no evidence of systemic infection at this point. The patient tells me that he is not going to be able to afford the Santyl. He is still waiting to hear about the infusion therapy with his dermatologist. Apparently she wants an updated colonoscopy first. 08/10/18 on evaluation today patient appears to be doing better in regard to his left lateral lower extremity ulcer. Fortunately he is showing signs of improvement in this regard he's actually been approved for Remicade infusion's as well although this has not been scheduled as of yet. Fortunately there's no signs of active infection at this time in regard to the wound although he is having some issues with infection of the right lower extremity is been seen as dermatologist for this. Fortunately they are definitely still working with him trying to keep things under control. RASUL, DECOLA ([id number]) 09/07/18 on evaluation today patient is actually doing rather well in regard to his left lateral lower extremity ulcer. He notes these actually having some hair grow back on his extremity which is something he has not seen in years. He also tells me that the pain is really not giving them any trouble at this time which is also good news overall she is very pleased with the progress he's using a combination of the mupirocin along with the probate is all mixed. 09/21/18 on evaluation today patient actually appears to be doing fairly well all things considered in regard to his looks from the ulcer. He's been tolerating the dressing changes  without complication. Fortunately there's no signs of active infection at this time which is good news he is still on all antibiotics or prevention of the staff infection. He has been on prednisone for time although he states it is gonna contact his dermatologist and see if she put them on a short course due to some irritation that he has going on currently. Fortunately there's  no evidence of any overall worsening this is going very slow I think cental would be something that would be helpful for him although he states that $50 for tube is quite expensive. He therefore is not willing to get that at this point. 10/06/18 on evaluation today patient actually appears to be doing decently well in regard to his left lateral leg ulcer. He's been tolerating the dressing changes without complication. Fortunately there's no signs of active infection at this time. Overall I'm actually rather pleased with the progress he's making although it's slow he doesn't show any signs of infection and he does seem to be making some improvement. I do believe that he may need a switch up and dressings to try to help this to heal more appropriately and quickly. 10/19/18 on evaluation today patient actually appears to be doing better in regard to his left lateral lower extremity ulcer. This is shown signs of having much less Slough buildup at this point due to the fact he has been using the [employer]. Obviously this is very good news. The overall size of the wound is not dramatically smaller but again the appearance is. 11/02/18 on evaluation today patient actually appears to be doing quite well in regard to his lower [place] ulcer. A lot of the skin around the ulcer is actually somewhat irritating at this point this seems to be more due to the dressing causing irritation from the adhesive that anything else. Fortunately there is no signs of active infection at this time. 11/24/18 on evaluation today patient appears to be doing a little  worse in regard to his overall appearance of his lower extremity ulcer. There's more erythema and warmth around the wound unfortunately. He is currently on doxycycline which he has been on for some time. With that being said I'm not sure that seems to be helping with what appears to possibly be an acute cellulitis with regard to his left lower extremity ulcer. No fevers, chills, nausea, or vomiting noted at this time. 12/08/18 on evaluation today patient's wounds actually appears to be doing significantly better compared to his last evaluation. He has been using Santyl along with alternating tripling about appointment as well as the steroid cream seems to be doing quite well and the wound is showing signs of improvement which is excellent news. Fortunately there's no evidence of infection and in fact his culture came back negative with only normal skin flora noted. 12/21/2018 upon evaluation today patient actually appears to be doing excellent with regard to his ulcer. This is actually the best that I have seen it since have been helping to take care of him. It is both smaller as well as less slough noted on the surface of the wound and seems to be showing signs of good improvement with new skin growing from the edges. He has been using just the triamcinolone he does wonder if he can get a refill of that ointment today. 01/04/2019 upon evaluation today patient actually appears to be doing well with regard to his left lateral lower extremity ulcer. With that being said it does not appear to be that he is doing quite as well as last time as far as progression is concerned. There does not appear to be any signs of infection or significant irritation which is good news. With that being said I do believe that he may benefit from switching to a collagen based dressing based on how clean The wound appears. 01/18/2019 on evaluation today patient actually appears  to be doing well with regard to his wound on the  left lower extremity. He is not made a lot of progress compared to where we were previous but nonetheless does seem to be doing okay at this time which is good news. There is no signs of active infection which is also good news. My only concern currently is I do wish we can get him into utilizing the collagen dressing his insurance would not pay for the supplies that we ordered although it appears that he may be able to order this through his supply company that he typically utilizes. This is [employer]. Nonetheless he did try to order it during the office visit today and it appears this did go through. We will see if he can get that it is a different brand but nonetheless he has collagen and I do think will be beneficial. 02/01/2019 on evaluation today patient actually appears to be doing a little worse today in regard to the overall size of his wounds. Fortunately there is no signs of active infection at this time. That is visually. Nonetheless when this is happened before it was due to infection. For that reason were somewhat concerned about that this time as well. 02/08/2019 on evaluation today patient unfortunately appears to be doing slightly worse with regard to his wound upon evaluation today. Is measuring a little deeper and a little larger unfortunately. I am not really sure exactly what is causing this to enlarge he actually did see his dermatologist she is going to see about initiating Humira for him. Subsequently she also did do steroid injections into the wound itself in the periphery. Nonetheless still nonetheless he seems to be getting a little bit larger he is gone back to just using the steroid cream topically which I think is appropriate. I would say hold off on the collagen for the time being is definitely a good thing to do. Based on the culture results which we finally did get the final result back regarding it shows staph as the bacteria noted again that can be a normal skin bacteria  based on the fact however he is having increased drainage and worsening of the wound measurement wise I would go ahead and place him on an antibiotic today I do believe for this. 02/15/2019 on evaluation today patient actually appears to be doing somewhat better in regard to his ulcer. There is no signs of worsening at this time I did review his culture results which showed evidence of Staphylococcus aureus but not MRSA. Again this could just be more related to the normal skin bacteria although he states the drainage has slowed down quite a bit he may have had a mild infection not just colonization. And was much smaller and then since around10/04/2019 on evaluation today patient appears to be doing unfortunately worse as far as the size of the wound. I really feel like that this is steadily getting larger again it had been doing excellent right at the beginning of September we have seen a steady increase in the area of the wound it is almost 2-1/2 times the size it was on September 1. Obviously this is a bad trend this is not wanting to see. For that reason we went back to using just the topical triamcinolone cream which does seem to help with inflammation. I checked him for bacteria by way of culture and nothing showed positive there. I am considering giving him a short course of a tapering steroid Dosepak today to  see if that is can be beneficial for him. The patient is in agreement with giving that a try. 03/08/2019 on evaluation today patient appears to be doing very well in comparison to last evaluation with regard to his lower extremity ulcer. This is showing signs of less inflammation and actually measuring slightly smaller compared to last time every other week over the past month and a half he has been measuring larger larger larger. Nonetheless I do believe that the issue has been inflammation the prednisone does seem to [facility], [place] ([id number]) have been beneficial for him which is good  news. No fevers, chills, nausea, vomiting, or diarrhea. 03/22/2019 on evaluation today patient appears to be doing about the same with regard to his leg ulcer. He has been tolerating the dressing changes without complication. With that being said the wound seems to be mostly arrested at its current size but really is not making any progress except for when we prescribed the prednisone. He did show some signs of dropping as far as the overall size of the wound during that interval week. Nonetheless this is something he is not on long-term at this point and unfortunately I think he is getting need either this or else the Humira which his dermatologist has discussed try to get approval for. With that being said he will be seeing his dermatologist on the 11th of this month that is November. 04/19/2019 on evaluation today patient appears to be doing really about the same the wound is measuring slightly larger compared to last time I saw him. He has not been into the office since November 2 due to the fact that he unfortunately had Covid as that his entire family. He tells me that it was rough but they did pull-through and he seems to be doing much better. Fortunately there is no signs of active infection at this time. No fevers, chills, nausea, vomiting, or diarrhea. 05/10/2019 on evaluation today patient unfortunately appears to be doing significantly worse as compared to last time I saw him. He does tell me that he has had his first dose of Humira and actually is scheduled to get the next one in the upcoming week. With that being said he tells me also that in the past several days he has been having a lot of issues with green drainage she showed me a picture this is more blue-green in color. He is also been having issues with increased sloughy buildup and the wound does appear to be larger today. Obviously this is not the direction that we want everything to take based on the starting of his Humira.  Nonetheless I think this is definitely a result of likely infection and to be honest I think this is probably Pseudomonas causing the infection based on what I am seeing. 05/24/2019 on evaluation today patient unfortunately appears to be doing significantly worse compared to his prior evaluation with me 2 weeks ago. I did review his culture results which showed that he does have Staph aureus as well as Pseudomonas noted on the culture. Nonetheless the Levaquin that I prescribed for him does not appear to have been appropriate and in fact he tells me he is no longer experiencing the green drainage and discharge that he had at the last visit. Fortunately there is no signs of active infection at this time which is good news although the wound has significantly worsened it in fact is much deeper than it was previous. We have been utilizing up to this point  triamcinolone ointment as the prescription topical of choice but at this time I really feel like that the wound is getting need to be packed in order to appropriately manage this due to the deeper nature of the wound. Therefore something along the lines of an alginate dressing may be more appropriate. 05/31/2019 upon inspection today patient's wound actually showed signs of doing poorly at this point. Unfortunately he just does not seem to be making any good progress despite what we have tried. He actually did go ahead and pick up the Cipro and start taking that as he was noticing more green drainage he had previously completed the Levaquin that I prescribed for him as well. Nonetheless he missed his appointment for the seventh last week on Wednesday with the wound care center and [facility] where his dermatologist referred him. Obviously I do think a second opinion would be helpful at this point especially in light of the fact that the patient seems to be doing so poorly despite the fact that we have tried everything that I really know how at this  point. The only thing that ever seems to have helped him in the past is when he was on high doses of continual steroids that did seem to make a difference for him. 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 06/07/2019 upon evaluation today patient unfortunately appears to be doing worse yet again with regard to his wound. In fact I am starting to question whether or not he may have a fluid pocket in the muscle at this point based on the bulging and the soft appearance to the central portion of the muscle area. There is not anything draining from the muscle itself at this time which is good news but nonetheless the wound is expanding. I am not really seeing any results of the Humira as far as overall wound progression based on what I am seeing at this point. The patient has been referred for second opinion with regard to his wound to the [facility] wound care center by his dermatologist which I definitely am not in opposition to. Unfortunately we tried multiple dressings in the past including collagen, alginate, and at one point even Hydrofera Blue. With that being said he is never really used it for any significant amount of time due to the fact that he often complains of pain associated with these dressings and then will go back to either using the Santyl which she has done intermittently or more frequently the triamcinolone. He is also using his own compression stockings. We have wrapped him in the past but again that was something else that he really was not a big fan of. 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 He seemed to do much better when he was on the steroids although honestly I understand that the steroids are not necessarily the best medication to be on long-term obviously 06/14/2019 on evaluation today patient appears to be doing  actually a little bit better with regard to the overall appearance with his leg. Unfortunately he does continue to have issues with what appears to be some fluid underneath the muscle although he did see the wound specialty center at [facility] last week their main goals were to see about infusion therapy in place of the Humira as they feel like that is not quite strong enough. They also recommended that we continue with the treatment otherwise as we are they felt like that was appropriate and they are okay with him continuing to follow-up here with [place] in that regard. With that being said they are also sending him to the vein specialist there to see about vein stripping and if that would be of benefit for him. Subsequently they also did not really address whether or not an ultrasound of the muscle area to see if there is anything that needs to be addressed here would be appropriate or not. For that reason I discussed this with him last week I think we may proceed down that road at this point. 06/21/2019 upon evaluation today patient's wound actually appears to be doing slightly better compared to previous evaluations. I do believe that he has made a difference with regard to the progression here with the use of oral steroids. Again in the past has been the only thing that is really calm things down. He does tell me that from [facility] is gotten a good news from there that there are no further vein stripping that is necessary at this point. I do not have that available for review today although the patient did relay this to me. He also did obtain and have the ultrasound of the wound completed which I did  sign off on today. It does appear that there is no fluid collection under the muscle this is likely then just edematous tissue in general. That is also good news. Overall I still believe the inflammation is the main issue here. He did inquire about the possibility of a wound VAC again with the muscle protruding like it is I am not really sure whether the wound VAC is necessarily ideal or not. That is something we will have to consider although I do believe he may need compression wrapping to try to help with edema control which could potentially be of benefit. 06/28/2019 on evaluation today patient appears to be doing slightly better measurement wise although this is not terribly smaller he least seems to be trending towards that direction. With that being said he still seems to have purulent drainage noted in the wound bed at this time. He has been on Levaquin followed by Cipro over the past month. Unfortunately he still seems to have some issues with active infection at this time. I did perform a culture last week in order to evaluate and see if indeed there was still anything going on. Subsequently the culture did come back showing Pseudomonas which is consistent with the drainage has been having which is blue-green in color. He also has had an odor that again was somewhat consistent with Pseudomonas as well. Long story short it appears that the culture showed an intermediate finding with regard to how well the Cipro will work for the Pseudomonas infection. Subsequently being that he does not seem to be clearing up and at best what we are doing is just keeping this at [place] I think he may need to see infectious disease to discuss IV antibiotic options. [name], [name] ([id number]) 07/05/2019 upon evaluation today  patient appears to be doing okay in regard to his leg ulcer. He has been tolerating the dressing changes at this point without complication. Fortunately there is no signs of active infection at  this time which is good news. No fevers, chills, nausea, vomiting, or diarrhea. With that being said he does have an appointment with infectious disease tomorrow and his primary care on Wednesday. Again the reason for the infectious disease referral was due to the fact that he did not seem to be fully resolving with the use of oral antibiotics and therefore we were thinking that IV antibiotic therapy may be necessary secondary to the fact that there was an intermediate finding for how effective the Cipro may be. Nonetheless again he has been having a lot of purulent and even green drainage. Fortunately right now that seems to have calmed down over the past week with the reinitiation of the oral antibiotic. Nonetheless we will see what Dr. Megan Salon has to say. 07/12/2019 upon evaluation today patient appears to be doing about the same at this point in regard to his left lower extremity ulcer. Fortunately there is no signs of active infection at this time which is good news I do believe the Levaquin has been beneficial I did review Dr. Hale Bogus note and to be honest I agree that the patient's leg does appear to be doing better currently. What we found in the past as he does not seem to really completely resolve he will stop the antibiotic and then subsequently things will revert back to having issues with blue-green drainage, increased pain, and overall worsening in general. Obviously that is the reason I sent him back to infectious disease. 07/19/2019 upon evaluation today patient appears to be doing roughly the same in size there is really no dramatic improvement. He has started back on the Levaquin at this point and though he seems to be doing okay he did still have a lot of blue/green drainage noted on evaluation today unfortunately. I think that this is still indicative more likely of a Pseudomonas infection as previously noted and again he does see Dr. Megan Salon in just a couple of days. I do not  know that were really able to effectively clear this with just oral antibiotics alone based on what I am seeing currently. Nonetheless we are still continue to try to manage as best we can with regard to the patient and his wound. I do think the wrap was helpful in decreasing the edema which is excellent news. No fevers, chills, nausea, vomiting, or diarrhea. 07/26/2019 upon evaluation today patient appears to be doing slightly better with regard to the overall appearance of the muscle there is no dark discoloration centrally. Fortunately there is no signs of active infection at this time. No fevers, chills, nausea, vomiting, or diarrhea. Patient's wound bed currently the patient did have an appointment with Dr. Megan Salon at infectious disease last week. With that being said Dr. Megan Salon the patient states was still somewhat hesitant about put him on any IV antibiotics he wanted [place] to repeat cultures today and then see where things go going forward. He does look like Dr. Megan Salon because of some improvement the patient did have with the Levaquin wanted [place] to see about repeating cultures. If it indeed grows the Pseudomonas again then he recommended a possibility of considering a PICC line placement and IV antibiotic therapy. He plans to see the patient back in 1 to 2 weeks. 08/02/2019 upon evaluation today patient appears to be  doing poorly with regard to his left lower extremity. We did get the results of his culture back it shows that he is still showing evidence of Pseudomonas which is consistent with the purulent/blue-green drainage that he has currently. Subsequently the culture also shows that he now is showing resistance to the oral fluoroquinolones which is unfortunate as that was really the only thing to treat the infection prior. I do believe that he is looking like this is going require IV antibiotic therapy to get this under control. Fortunately there is no signs of systemic infection at this  time which is good news. The patient does see Dr. Megan Salon tomorrow. 08/09/2019 upon evaluation today patient appears to be doing better with regard to his left lower extremity ulcer in regard to the overall appearance. He is currently on IV antibiotic therapy. As ordered by Dr. Megan Salon. Currently the patient is on ceftazidime which she is going to take for the next 2 weeks and then follow-up for 4 to 5-week appointment with Dr. Megan Salon. The patient started this this past Friday symptoms have not for a total of 3 days currently in full. 08/16/2019 upon evaluation today patient's wound actually does show muscle in the base of the wound but in general does appear to be much better as far as the overall evidence of infection is concerned. In fact I feel like this is for the most part cleared up he still on the IV antibiotics he has not completed the full course yet but I think he is doing much better which is excellent news. 08/23/2019 upon evaluation today patient appears to be doing about the same with regard to his wound at this point. He tells me that he still has pain unfortunately. Fortunately there is no evidence of systemic infection at this time which is great news. There is significant muscle protrusion. 09/13/19 upon evaluation today patient appears to be doing about the same in regard to his leg unfortunately. He still has a lot of drainage coming from the ulceration there is still muscle exposed. With that being said the patient's last wound culture still showed an intermediate finding with regard to the Pseudomonas he still having the bluish/green drainage as well. Overall I do not know that the wound has completely cleared of infection at this point. Fortunately there is no signs of active infection systemically at this point which is good news. 09/20/2019 upon evaluation today patient's wound actually appears to be doing about the same based on what I am seeing currently. I do not see any  signs of systemic infection he still does have evidence of some local infection and drainage. He did see Dr. Megan Salon last week and Dr. Megan Salon states that he probably does need a different IV antibiotic although he does not want to put him on this until the patient begins the Remicade infusion which is actually scheduled for about 10 days out from today on 13 May. Following that time Dr. Megan Salon is good to see him back and then will evaluate the feasibility of starting him on the IV antibiotic therapy once again at that point. I do not disagree with this plan I do believe as Dr. Megan Salon stated in his note that I reviewed today that the patient's issue is multifactorial with the pyoderma being 1 aspect of this that were hoping the Remicade will be helpful for her. In the meantime I think the gentamicin is, helping to keep things under decent okay control in regard to the ulcer.  09/27/2019 upon evaluation today patient appears to be doing about the same with regard to his wound still there is a lot of muscle exposure though he does have some hyper granulation tissue noted around the edge and actually some granulation tissue starting to form over the muscle which is actually good news. Fortunately there is no evidence of active infection which is also good news. His pain is less at this point. 5/21; this is a patient I have not seen in a long time. 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 He has a large wound on the left lateral leg with protruding muscle. He comes in the clinic today showing the same area on his left medial ankle. He says there is been a spot there for some time although we have not previously defined this. Today he has a clearly defined area with slight amount of skin breakdown surrounded by raised areas with a purplish hue in color. This is not painful he says it is irritated. This looks distinctly like I might imagine pyoderma  starting 10/25/2019 upon evaluation today patient's wound actually appears to be making some progress. He still has muscle protruding from the lateral portion of his left leg but fortunately the new area that they were concerned about at his last visit does not appear to have opened at this point. He is currently on Remicade infusions and seems to be doing better in my opinion in fact the wound itself seems to be overall much better. The purplish discoloration that he did have seems to have resolved and I think that is a good sign that hopefully the Remicade is doing its job. He does have some biofilm noted over the surface of the wound. 11/01/2019 on evaluation today patient's wound actually appears to be doing excellent at this time. Fortunately there is no evidence of active infection and overall I feel like he is making great progress. The Remicade seems to be due excellent job in my opinion. HASSAN, BLACKSHIRE ([id number]) 11/08/19 evaluation today vision actually appears to be doing quite well with regard to his weight ulcer. He's been tolerating dressing changes without complication. Fortunately there is no evidence of infection. No fevers, chills, nausea, or vomiting noted at this time. Overall states that is having more itching than pain which is actually a good sign in my opinion. 12/13/2019 upon evaluation today patient appears to be doing well today with regard to his wound. He has been tolerating the dressing changes without complication. Fortunately there is no sign of active infection at this time. No fevers, chills, nausea, vomiting, or diarrhea. Overall I feel like the infusion therapy has been very beneficial for him. 01/06/2020 on evaluation today patient appears to be doing well with regard to his wound. This is measuring smaller and actually looks to be doing better. Fortunately there is no signs of active infection at this point. No fevers, chills, nausea, vomiting, or diarrhea. With that  being said he does still have the blue-green drainage but this does not seem to be causing any significant issues currently. He has been using the gentamicin that does seem to be keeping things under decent control at this point. He goes later this morning for his next infusion therapy for the pyoderma which seems to also be very beneficial. 02/07/2020 on evaluation today patient appears to be doing about the same in regard to his wounds currently. Fortunately there is no signs of active infection systemically he does still have evidence  of local infection still using gentamicin. He also is showing some signs of improvement albeit slowly I do feel like we are making some progress here. 02/21/2020 upon evaluation today patient appears to be making some signs of improvement the wound is measuring a little bit smaller which is great news and overall I am very pleased with where he stands currently. He is going to be having infusion therapy treatment on the 15th of this month. Fortunately there is no signs of active infection at this time. 03/13/2020 I do believe patient's wound is actually showing some signs of improvement here which is great news. He has continue with the infusion therapy through rheumatology/dermatology at [facility]. That does seem to be beneficial. I still think he gets as much benefit from this as he did from the prednisone initially but nonetheless obviously this is less harsh on his body that the prednisone as far as they are concerned. 03/31/2020 on evaluation today patient's wound actually showing signs of some pretty good improvement in regard to the overall appearance of the wound bed. There is still muscle exposed though he does have some epithelial growth around the edges of the wound. Fortunately there is no signs of active infection at this time. No fevers, chills, nausea, vomiting, or diarrhea. 04/24/2020 upon evaluation today patient appears to be doing about the same in regard  to his leg ulcer. He has been tolerating the dressing changes without complication. Fortunately there is no signs of active infection at this time. No fevers, chills, nausea, vomiting, or diarrhea. With that being said he still has a lot of irritation from the bandaging around the edges of the wound. We did discuss today the possibility of a referral to plastic surgery. 05/22/2020 on evaluation today patient appears to be doing well with regard to his wounds all things considered. He has not been able to get the Chantix apparently there is a recall nurse that I was unaware of put out by [employer] involuntarily. Nonetheless for now I am and I have to do some research into what may be the best option for him to help with quitting in regard to smoking and we discussed that today. 06/26/2020 upon evaluation today patient appears to be doing well with regard to his wound from the standpoint of infection I do not see any signs of infection at this point. With that being said unfortunately he is still continuing to have issues with muscle exposure and again he is not having a whole lot of new skin growth unfortunately. There does not appear to be any signs of active infection at this time. No fevers, chills, nausea, vomiting, or diarrhea. 07/10/2020 upon evaluation today patient appears to be doing a little bit more poorly currently compared to where he was previous. I am concerned currently about an active infection that may be getting worse especially in light of the increased size and tenderness of the wound bed. No fevers, chills, nausea, vomiting, or diarrhea. 07/24/2020 upon evaluation today patient appears to be doing poorly in regard to his leg ulcer. He has been tolerating the dressing changes without complication but unfortunately is having a lot of discomfort. 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 Subsequently I think he is going  require possibly IV antibiotics to get this under control. I am very concerned about the severity of his infection and the amount of discomfort he is having. 07/31/2020 upon evaluation today patient appears to be doing  about the same in regard to his leg wound. He did see Dr. Megan Salon and Dr. Megan Salon is actually going to start him on IV antibiotics. He goes for the PICC line tomorrow. With that being said there do not have that run for 2 weeks and then see how things are doing and depending on how he is progressing they may extend that a little longer. Nonetheless I am glad this is getting ready to be in place and definitely feel it may help the patient. In the meantime is been using mainly triamcinolone to the wound bed has an anti-inflammatory. 08/07/2020 on evaluation today patient appears to be doing well with regard to his wound compared even last week. In the interim he has gotten the PICC line placed and overall this seems to be doing excellent. There does not appear to be any evidence of infection which is great news systemically although locally of course has had the infection this appears to be improving with the use of the antibiotics. 08/14/2020 upon evaluation today patient's wound actually showing signs of excellent improvement. Overall the irritation has significantly improved the drainage is back down to more of a normal level and his pain is really pretty much nonexistent compared to what it was. Obviously I think that this is significantly improved secondary to the IV antibiotic therapy which has made all the difference in the world. Again he had a resistant form of Pseudomonas for which oral antibiotics just was not cutting it. Nonetheless I do think that still we need to consider the possibility of a surgical closure for this wound is been open so long and to be honest with muscle exposed I think this can be very hard to get this to close outside of this although definitely were  still working to try to do what we can in that regard. 08/21/2020 upon evaluation today patient appears to be doing very well with regard to his wounds on the left lateral lower extremity/calf area. Fortunately there does not appear to be signs of active infection which is great news and overall very pleased with where things stand today. He is actually wrapping up his treatment with IV antibiotics tomorrow. After that we will see where things go from there. 08/28/2020 upon evaluation today patient appears to be doing decently well with regard to his leg ulcer. There does not appear to be any signs of active infection which is great news and overall very pleased with where things stand today. No fevers, chills, nausea, vomiting, or diarrhea. 09/18/2020 upon evaluation today patient appears to be doing well with regard to his infection which I feel like is better. Unfortunately he is not doing as well with regard to the overall size of the wound which is not nearly as good at this point. I feel like that he may be having an issue here with the pyoderma being somewhat out of control. I think that he may benefit from potentially going back and talking to the dermatologist about MALOSI, HEMSTREET ([id number]) what to do from the pyoderma standpoint. I am not certain if the infusions are helping nearly as much is what the prednisone did in the past. 10/02/2020 upon evaluation today patient appears to be doing well with regard to his leg ulcer. He did go to the [facility]iatric nurse. Unfortunately they feel like there is a 10% chance that most that he would be able to heal and that the skin graft would take. Obviously this has led him to not be  able to go down that path as far as treatment is concerned. Nonetheless he does seem to be doing a little bit better with the prednisone that I gave him last time. I think that he may need to discuss with dermatology the possibility of long-term prednisone as that seems to be what is  most helpful for him to be perfectly honest. I am not sure the Remicade is really doing the job. 10/17/2020 upon evaluation today patient appears to be doing a little better in regard to his wound. In fact the case has been since we did the prednisone on May 2 for him that we have noticed a little bit of improvement each time we have seen a size wise as well as appearance wise as well as pain wise. I think the prednisone has had a greater effect then the infusion therapy has to be perfectly honest. With that being said the patient also feels significantly better compared to what he was previous. All of this is good news but nonetheless I am still concerned about the fact that again we are really not set up to long-term manage him as far as prednisone is concerned. Obviously there are things that you need to be watched I completely understand the risk of prednisone usage as well. 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 With all that being said I do believe that we can give him another round of the prednisone which she is requesting today because of the improvement that he seen since we did that first round. 10/30/2020 upon evaluation today patient's wound actually is showing signs of doing quite well. There does not appear to be any evidence of infection which is great news and overall very pleased with where things stand today. No fevers, chills, nausea, vomiting, or diarrhea. He tells me that the prednisone still has seem to have helped he wonders if we can extend that for just a little bit longer. He did not have the appointment with a dermatologist although he did have an infusion appointment last Friday. That was at [facility]. With that being said he tells me he could not do both that as well as the appointment with the physician on the same day therefore that is can have to be rescheduled. I really want to see if there is anything they feel like that could  be done differently to try to help this out as I am not really certain that the infusions are helping significantly here. 11/13/2020 upon evaluation today patient unfortunately appears to be doing somewhat poorly in regard to his wound I feel like this is actually worsening from the standpoint of the pyoderma spreading. I still feel like that he may need something different as far as trying to manage this going forward. Again we did the prednisone unfortunately his blood sugars are not doing so well because of this. Nonetheless I believe that the patient likely needs to try topical steroid. We have done triamcinolone for a while I think going with something stronger such as clobetasol could be beneficial again this is not something I do lightly I discussed this with the patient that again this does not normally put underneath an occlusive dressing. Nonetheless I think a thin film as such could help with some of the stronger anti-inflammatory effects. We discussed this today. He would like to try to give this a trial for the next couple weeks. I definitely think that is something  that we can do. Evaluate7/03/2021 and today patient's wound bed actually showed signs of doing really about the same. There was a little expansion of the size of the wound and that leading edge that we done looking out although the clobetasol does seem to have slowed this down a bit in my opinion. There is just 1 small area that still seems to be progressing based on what I see. Nonetheless I am concerned about the fact this does not seem to be improving if anything seems to be doing a little bit worse. I do not know that the infusions are really helping him much as next infusion is August 5 his appointment with dermatology is July 25. Either way I really think that we need to have a conversation potentially about this and I am actually going to see if I can talk with Dr. Lillia Carmel in order to see where things stand as  well. 12/11/2020 upon evaluation today patient appears to be doing worse in regard to his leg ulcer. Unfortunately I just do not think this is making the progress that I would like to see at this point. Honestly he does have an appointment with dermatology and this is in 2 days. I am wondering what they may have to offer to help with this. Right now what I am seeing is that he is continuing to show signs of worsening little by little. Obviously that is not great at all. Is the exact opposite of what we are looking for. 12/18/2020 upon evaluation today patient appears to be doing a little better in regard to his wound. The dermatologist actually did do some steroid injections into the wound which does seem to have been beneficial in my opinion. That was on the 25th already this looks a little better to me than last time I saw him. With that being said we did do a culture and this did show that he has Staph aureus noted in abundance in the wound. With that being said I do think that getting him on an oral antibiotic would be appropriate as well. Also think we can compression wrap and this will make a difference as well. 12/28/2020 upon evaluation today patient's wound is actually showing signs of doing much better. I do believe the compression wrap is helping he has a lot of drainage but to be honest I think that the compression is helping to some degree in this regard as well as not draining through which is also good news. No fevers, chills, nausea, vomiting, or diarrhea. 01/04/2021 upon evaluation today patient appears to be doing well with regard to his wound. Overall things seem to be doing quite well. He did have a little bit of reaction to the CarboFlex Sorbact he will be using that any longer. With that being said he is controlled as far as the drainage is concerned overall and seems to be doing quite well. I do not see any signs of active infection at this time which is great news. No fevers,  chills, nausea, vomiting, or diarrhea. 01/11/2021 upon evaluation today patient appears to be doing well with regard to his wounds. He has been tolerating the dressing changes without complication. Fortunately there does not appear to be any signs of active infection at this time which is great news. Overall I am extremely pleased with where we stand currently. No fevers, chills, nausea, vomiting, or diarrhea. Where using clobetasol in the wound bed he has a lot of new skin growth which is  awesome as well. 01/18/2021 upon evaluation today patient appears to be doing very well in regard to his leg ulcer. He has been tolerating the dressing changes without complication. Fortunately there does not appear to be any signs of active infection which is great news. In general I think that he is making excellent progress 01/25/2021 upon evaluation today patient appears to be doing well with regard to his wound on the leg. I am actually extremely pleased with where things stand today. There does not appear to be any signs of active infection which is great news and overall I think that we are definitely headed in the appropriate direction based on what I am seeing currently. There does not appear to be any signs of active infection also excellent news. 02/06/2021 upon evaluation today patient appears to be doing well with regard to his wound. Overall visually this is showing signs of significant improvement which is great news. I do not see any signs of active infection systemically which is great even locally I do not think that we are seeing any major complications here. We did do fluorescence imaging with the [employer] today. The patient does have some odor and drainage noted and again this is something that I think would benefit him to probably come more frequently for nurse visits. 02/19/2021 upon evaluation today patient actually appears to be doing quite well in regard to his wound. He has been tolerating  the dressing Sayre, Tsutomu ([id number]) changes without complication and overall I think that this is making excellent progress. I do not see any evidence of active infection at this point which is great news as well. No fevers, chills, nausea, vomiting, or diarrhea. 10/10; wound is made nice progress healthy granulation with a nice rim of epithelialization which seems to be expanding even from last week he has a deeper area in the inferior part of the more distal part of the wound with not quite as healthy as surface. This area will need to be followed. Using clobetasol and Hydrofera Blue Objective Constitutional Sitting or standing Blood Pressure is within target range for patient.. Pulse regular and within target range for patient.Marland Kitchen Respirations regular, non- labored and within target range.. Temperature is normal and within the target range for the patient.Marland Kitchen appears in no distress. Vitals Time Taken: 8:19 AM, Height: 71 in, Weight: 338 lbs, BMI: 47.1, Temperature: 98.1 F, Pulse: 73 bpm, Respiratory Rate: 18 breaths/min, Blood Pressure: 120/74 mmHg. Cardiovascular Pedal pulses palpable.. General Notes: Wound exam; left lateral lower calf. Decent granulation albeit a little dry. However nice rim of epithelialization. There is a slightly deeper area with not quite as viable surface distally. At this point I think this area is going to need to be followed. Mechanical debridement relatively contraindicated Integumentary (Hair, Skin) Wound #1 status is Open. Original cause of wound was Gradually Appeared. The date acquired was: 11/18/2015. The wound has been in treatment 220 weeks. The wound is located on the Left,Lateral Lower Leg. The wound measures 5.5cm length x 3.6cm width x 0.3cm depth; 15.551cm^2 area and 4.665cm^3 volume. There is muscle, tendon, and Fat Layer (Subcutaneous Tissue) exposed. There is no tunneling or undermining noted. There is a large amount of serosanguineous drainage  noted. There is large (67-100%) red granulation within the wound bed. There is a small (1-33%) amount of necrotic tissue within the wound bed including Adherent Slough. 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 There was a Four Layer Compression Therapy Procedure by Dolan Amen, RN. Post procedure Diagnosis Wound #1: Same as Pre-Procedure Plan Twyford, Turhan ([id number]) 1. Did not change the primary dressing clobetasol Hydrofera Blue 2. Careful follow-up of the distal divot vis--vis surface 3. Follows with dermatology every 3 months he is on Public librarian) Signed: 02/26/2021 5:14:01 PM By: Linton Ham MD Entered By: Linton Ham on 02/26/2021 08:39:07 [name] ([id number]) -------------------------------------------------------------------------------- [place] Details Patient Name: [name] Date of Service: 02/26/2021 Medical Record Number: [id number] Patient Account Number: [id number] Date of Birth/Sex: 07-20-1980 (40 y.o. M) Treating RN: Dolan Amen Primary Care Provider: [name] Other Clinician: Referring Provider: [name] Treating Provider/Extender: Tito Dine in Treatment: 220 Diagnosis Coding ICD-10 Codes Code Description [phone] Non-pressure chronic ulcer of left calf with fat layer exposed L88 Pyoderma gangrenosum L97.321 Non-pressure chronic ulcer of left ankle limited to breakdown of skin I87.2 Venous insufficiency (chronic) (peripheral) L03.116 Cellulitis of left lower limb F17.208 Nicotine  dependence, unspecified, with other nicotine-induced disorders Facility Procedures CPT4 Code: 94076808 Description: (Facility Use Only) [phone] - [place] COMPRS LWR LT LEG Modifier: Quantity: 1 Physician Procedures CPT4 Code: 9458592 Description: 92446 - WC PHYS LEVEL 3 - EST PT Modifier: Quantity: 1 CPT4 Code: Description: ICD-10 Diagnosis Description L97.222 Non-pressure chronic ulcer of left calf with fat layer exposed L88 Pyoderma gangrenosum Modifier: Quantity: Electronic Signature(s) Signed: 02/26/2021 5:03:00 PM By: Dolan Amen RN Signed: 02/26/2021 5:14:01 PM By: Linton Ham MD Entered By: Dolan Amen on 02/26/2021 08:48:04

## 2021-02-26 NOTE — Progress Notes (Signed)
[name] ([id number]) Visit Report for 02/26/2021 Arrival Information Details Patient Name: [name], [name] Date of Service: 02/26/2021 8:15 AM Medical Record Number: [id number] Patient Account Number: [id number] Date of Birth/Sex: [birth year] (40 [name].o. M) Treating RN: [name] Primary Care [name]: [name] Other Clinician: Referring [name]: [name] Treating [name]/Extender: [name] in Treatment: 55 Visit Information History Since Last Visit Pain Present Now: No Patient Arrived: Ambulatory Arrival Time: 08:19 Accompanied By: self Transfer Assistance: None Patient Identification Verified: Yes Secondary Verification Process Completed: Yes Patient Requires Transmission-Based No Precautions: Patient Has Alerts: Yes Patient Alerts: Patient has reaction to silver dressings. Electronic Signature(s) Signed: 02/26/2021 5:03:00 PM By: [name] Amen RN Entered By: [name] on 02/26/2021 08:19:17 [name] ([id number]) -------------------------------------------------------------------------------- Clinic Level of Care Assessment Details Patient Name: [name] Date of Service: 02/26/2021 8:15 AM Medical Record Number: [id number] Patient Account Number: [id number] Date of Birth/Sex: 08/09/1980 (40 [name].o. M) Treating RN: [name] Primary Care Beyonka Pitney: [name] Other Clinician: Referring [name]: [name] Treating [name]/Extender: [name] in Treatment: 220 Clinic Level of Care Assessment Items TOOL 1 Quantity Score []  - Use when EandM and Procedure is performed on INITIAL visit 0 ASSESSMENTS - Nursing Assessment / Reassessment []  - General Physical Exam (combine w/ comprehensive assessment (listed just below) when performed on new 0 pt. evals) []  - 0 Comprehensive Assessment (HX, ROS, Risk Assessments, Wounds Hx, etc.) ASSESSMENTS - Wound and Skin Assessment / Reassessment []  -  Dermatologic / Skin Assessment (not related to wound area) 0 ASSESSMENTS - Ostomy and/or Continence Assessment and Care []  - Incontinence Assessment and Management 0 []  - 0 Ostomy Care Assessment and Management (repouching, etc.) PROCESS - Coordination of Care []  - Simple Patient / Family Education for ongoing care 0 []  - 0 Complex (extensive) Patient / Family Education for ongoing care []  - 0 Staff obtains Programmer, systems, Records, Test Results / Process Orders []  - 0 Staff telephones HHA, Nursing Homes / Clarify orders / etc []  - 0 Routine Transfer to another Facility (non-emergent condition) []  - 0 Routine Hospital Admission (non-emergent condition) []  - 0 New Admissions / Biomedical engineer / Ordering NPWT, Apligraf, etc. []  - 0 Emergency Hospital Admission (emergent condition) PROCESS - Special Needs []  - Pediatric / Minor Patient Management 0 []  - 0 Isolation Patient Management []  - 0 Hearing / Language / Visual special needs []  - 0 Assessment of Community assistance (transportation, D/C planning, etc.) []  - 0 Additional assistance / Altered mentation []  - 0 Support Surface(s) Assessment (bed, cushion, seat, etc.) INTERVENTIONS - Miscellaneous []  - External ear exam 0 []  - 0 Patient Transfer (multiple staff / Civil Service fast streamer / Similar devices) []  - 0 Simple Staple / Suture removal (25 or less) []  - 0 Complex Staple / Suture removal (26 or more) []  - 0 Hypo/Hyperglycemic Management (do not check if billed separately) []  - 0 Ankle / Brachial Index (ABI) - do not check if billed separately Has the patient been seen at the hospital within the last three years: Yes Total Score: 0 Level Of Care: ____ [name] ([id number]) Electronic Signature(s) Signed: 02/26/2021 5:03:00 PM By: [name] Amen RN Entered By: [name] on 02/26/2021 08:47:55 [name], [name] ([id number]) -------------------------------------------------------------------------------- Compression  Therapy Details Patient Name: [name] Date of Service: 02/26/2021 8:15 AM Medical Record Number: [id number] Patient Account Number: [id number] Date of Birth/Sex: [birth year] (40 [name].o. M) Treating RN: [name] Primary Care Lashai Grosch: [name] Other Clinician: Referring [name]: [name] Treating Connell Bognar/Extender: [name]  [name] in Treatment: 220 Compression Therapy Performed for Wound Assessment: Wound #1 Left,Lateral Lower Leg Performed By: [name] Daniels, RN Compression Type: Four Layer Post Procedure Diagnosis Same as Pre-procedure Electronic Signature(s) Signed: 02/26/2021 5:03:00 PM By: [name] Amen RN Entered By: [name] on 02/26/2021 08:35:04 [name] ([id number]) -------------------------------------------------------------------------------- Encounter Discharge Information Details Patient Name: [name] Date of Service: 02/26/2021 8:15 AM Medical Record Number: [id number] Patient Account Number: [id number] Date of Birth/Sex: 1980/06/02 (40 [name].o. M) Treating RN: [name] Primary Care [name]: [name] Other Clinician: Referring [name]: [name] Treating [name]/Extender: [name] in Treatment: 220 Encounter Discharge Information Items Discharge Condition: Stable Ambulatory Status: Ambulatory Discharge Destination: Home Transportation: Private Auto Accompanied By: self Schedule Follow-up Appointment: Yes Clinical Summary of Care: Electronic Signature(s) Signed: 02/26/2021 5:03:00 PM By: [name] Amen RN Entered By: [name] on 02/26/2021 08:48:41 [name] ([id number]) -------------------------------------------------------------------------------- Lower Extremity Assessment Details Patient Name: [name] Date of Service: 02/26/2021 8:15 AM Medical Record Number: [id number] Patient Account Number: [id number] Date of Birth/Sex: [birth year] (40 [name].o.  M) Treating RN: [name] Primary Care [place] Rico: [name] Other Clinician: Referring [name]: [name] Treating [name]/Extender: [name] in Treatment: 220 Edema Assessment Assessed: [Left: Yes] [Right: No] Edema: [Left: Ye] [Right: s] Calf Left: Right: Point of Measurement: 33 cm From Medial Instep 47 cm Ankle Left: Right: Point of Measurement: 12 cm From Medial Instep 29 cm Vascular Assessment Pulses: Dorsalis Pedis Palpable: [Left:Yes] Electronic Signature(s) Signed: 02/26/2021 5:03:00 PM By: [name] Amen RN Entered By: [name] on 02/26/2021 08:37:14 [name], [name] [place] ([id number]) -------------------------------------------------------------------------------- Multi Wound Chart Details Patient Name: [name] Date of Service: 02/26/2021 8:15 AM Medical Record Number: [id number] Patient Account Number: [id number] Date of Birth/Sex: [birth year] (40 [name].o. M) Treating RN: [name] Primary Care Jameila Keeny: [name] Other Clinician: Referring Sabria Florido: [name] Treating Menaal Russum/Extender: [name] in Treatment: 220 Vital Signs Height(in): 71 Pulse(bpm): 73 Weight(lbs): 338 Blood Pressure(mmHg): 120/74 Body Mass Index(BMI): 47 Temperature(F): 98.1 Respiratory Rate(breaths/min): 18 Photos: [N/A:N/A] Wound Location: Left, Lateral Lower Leg N/A N/A Wounding Event: Gradually Appeared N/A N/A Primary Etiology: Pyoderma N/A N/A Comorbid History: Sleep Apnea, Hypertension, Colitis N/A N/A Date Acquired: 11/18/2015 N/A N/A [name] of Treatment: 220 N/A N/A Wound Status: Open N/A N/A Measurements L x W x D (cm) 5.5x3.6x0.3 N/A N/A Area (cm) : 15.551 N/A N/A Volume (cm) : 4.665 N/A N/A % Reduction in Area: -216.80% N/A N/A % Reduction in Volume: -18.80% N/A N/A Classification: Full Thickness With Exposed N/A N/A Support Structures Exudate Amount: Large N/A N/A Exudate Type: Serosanguineous N/A  N/A Exudate Color: red, brown N/A N/A Granulation Amount: Large (67-100%) N/A N/A Granulation Quality: Red N/A N/A Necrotic Amount: Small (1-33%) N/A N/A Exposed Structures: Fat Layer (Subcutaneous Tissue): N/A N/A Yes Tendon: Yes Muscle: Yes Fascia: No Joint: No Bone: No Epithelialization: Medium (34-66%) N/A N/A Procedures Performed: Compression Therapy N/A N/A Treatment Notes Electronic Signature(s) Signed: 02/26/2021 5:14:01 PM By: Linton Ham MD Entered By: Linton Ham on 02/26/2021 08:35:46 [name] ([id number]) -------------------------------------------------------------------------------- Multi-Disciplinary Care Plan Details Patient Name: [name] Date of Service: 02/26/2021 8:15 AM Medical Record Number: [id number] Patient Account Number: [id number] Date of Birth/Sex: 08-24-1980 (40 [name].o. M) Treating RN: [name] Primary Care Aunesty Tyson: [name] Other Clinician: Referring Antanasia Kaczynski: [name] Treating Lorene Samaan/Extender: [name] in Treatment: [place] reviewed with physician Active Inactive Electronic Signature(s) Signed: 02/26/2021 5:03:00 PM By: [name] Amen RN Entered By: Laurin Coder,  Kenia on 02/26/2021 08:33:59 MIHAILO, [name] ([id number]) -------------------------------------------------------------------------------- Pain Assessment Details Patient Name: [name], [name] Date of Service: 02/26/2021 8:15 AM Medical Record Number: [id number] Patient Account Number: [id number] Date of Birth/Sex: 1980/06/11 (42 [name].o. M) Treating RN: [name] Primary Care Arihana Ambrocio: [name] Other Clinician: Referring Brennley Curtice: [name] Treating Harli Engelken/Extender: [name] in Treatment: 220 Active Problems Location of Pain Severity and Description of Pain Patient Has Paino No Site Locations Pain Management and Medication Current Pain Management: Electronic  Signature(s) Signed: 02/26/2021 5:03:00 PM By: [name] Amen RN Entered By: [name] on 02/26/2021 08:20:42 [name] ([id number]) -------------------------------------------------------------------------------- Patient/Caregiver Education Details Patient Name: [name] Date of Service: 02/26/2021 8:15 AM Medical Record Number: [id number] Patient Account Number: [id number] Date of Birth/Gender: [birth year] (40 [name].o. M) Treating RN: [name] Primary Care Physician: [name] Other Clinician: Referring Physician: Alma [name] Treating Physician/Extender: [name] in Treatment: 43 Education Assessment Education Provided To: Patient Education Topics Provided Wound/Skin Impairment: Methods: Explain/Verbal Responses: State content correctly Electronic Signature(s) Signed: 02/26/2021 5:03:00 PM By: [name] Amen RN Entered By: [name] on 02/26/2021 08:48:12 [name], [name] ([id number]) -------------------------------------------------------------------------------- Wound Assessment Details Patient Name: [name] Date of Service: 02/26/2021 8:15 AM Medical Record Number: [id number] Patient Account Number: [id number] Date of Birth/Sex: 15-Feb-1981 (40 [name].o. M) Treating RN: [name] Primary Care Andreia Gandolfi: [name] Other Clinician: Referring Kirsten Mckone: [name] Treating Ladesha Pacini/Extender: [name] in Treatment: 220 Wound Status Wound Number: 1 Primary Etiology: Pyoderma Wound Location: Left, Lateral Lower Leg Wound Status: Open Wounding Event: Gradually Appeared Comorbid History: Sleep Apnea, Hypertension, Colitis Date Acquired: 11/18/2015 [name] Of Treatment: 220 Clustered Wound: No Photos Wound Measurements Length: (cm) 5.5 Width: (cm) 3.6 Depth: (cm) 0.3 Area: (cm) 15.551 Volume: (cm) 4.665 % Reduction in Area: -216.8% % Reduction in Volume: -18.8% Epithelialization: Medium  (34-66%) Tunneling: No Undermining: No Wound Description Classification: Full Thickness With Exposed Support Structures Exudate Amount: Large Exudate Type: Serosanguineous Exudate Color: red, brown Foul Odor After Cleansing: No Slough/Fibrino Yes Wound Bed Granulation Amount: Large (67-100%) Exposed Structure Granulation Quality: Red Fascia Exposed: No Necrotic Amount: Small (1-33%) Fat Layer (Subcutaneous Tissue) Exposed: Yes Necrotic Quality: Adherent Slough Tendon Exposed: Yes Muscle Exposed: Yes Necrosis of Muscle: No Joint Exposed: No Bone Exposed: No Treatment Notes Wound #1 (Lower Leg) Wound Laterality: Left, Lateral Cleanser Wound Cleanser Discharge Instruction: Wash your hands with soap and water. Remove old dressing, discard into plastic bag and place into trash. Cleanse the wound with Wound Cleanser prior to applying a clean dressing using gauze sponges, not tissues or cotton balls. Do not scrub or use excessive force. Pat dry using gauze sponges, not tissue or cotton balls. [name], VENEY ([id number]) [place] Lotion Discharge Instruction: Suggestions: Theraderm, Eucerin, Cetaphil, or patient preference. Topical Clobetasol Propionate ointment 0.05%, 60 (g) tube Discharge Instruction: Apply to 7 o'clock depth Primary Dressing Hydrofera Blue Ready Transfer Foam, 4x5 (in/in) Discharge Instruction: Apply Hydrofera Blue Ready to wound bed as directed Secondary Dressing Xtrasorb Medium 4x5 (in/in) Discharge Instruction: Apply to wound as directed. Do not cut. Secured With Compression Wrap Medichoice 4 layer Compression System, 35-40 mmHG Discharge Instruction: Apply multi-layer wrap as directed. Compression Stockings Add-Ons Electronic Signature(s) Signed: 02/26/2021 5:03:00 PM By: [name] Amen RN Entered By: [name] on 02/26/2021 08:30:08 [name], [name]  ([id number]) -------------------------------------------------------------------------------- Reading Details Patient Name: [name] Date of Service: 02/26/2021 8:15 AM Medical Record Number: [id number] Patient Account Number: [id number] Date of Birth/Sex: 05/24/1980 (40 [name].o. M) Treating RN: Laurin Coder,  Minus Breeding Primary Care Xoe Hoe: [name] Other Clinician: Referring [name] Roe: [name] Treating Kahleel Fadeley/Extender: [name] in Treatment: 220 Vital Signs Time Taken: 08:19 Temperature (F): 98.1 Height (in): 71 Pulse (bpm): 73 Weight (lbs): 338 Respiratory Rate (breaths/min): 18 Body Mass Index (BMI): 47.1 Blood Pressure (mmHg): 120/74 Reference Range: 80 - 120 mg / dl Electronic Signature(s) Signed: 02/26/2021 5:03:00 PM By: [name] Amen RN Entered By: [name] on 02/26/2021 08:20:28

## 2021-02-27 ENCOUNTER — Encounter: Payer: 59 | Admitting: Physician Assistant

## 2021-02-28 ENCOUNTER — Other Ambulatory Visit: Payer: Self-pay

## 2021-02-28 ENCOUNTER — Ambulatory Visit: Payer: 59 | Admitting: Primary Care

## 2021-02-28 DIAGNOSIS — L97222 Non-pressure chronic ulcer of left calf with fat layer exposed: Secondary | ICD-10-CM | POA: Diagnosis not present

## 2021-02-28 NOTE — Progress Notes (Addendum)
GATLYN, LIPARI ([id number]) Visit Report for 02/28/2021 Arrival Information Details Patient Name: [name], [name] Date of Service: 02/28/2021 8:00 AM Medical Record Number: [id number] Patient Account Number: [id number] Date of Birth/Sex: 13-Oct-1980 (40 y.o. M) Treating RN: Donnamarie Poag Primary Care Besnik Febus: [name] Other Clinician: Referring Mayson Mcneish: [name] Treating Mertha Clyatt/Extender: Yaakov Guthrie in Treatment: 221 Visit Information History Since Last Visit Added or deleted any medications: No Patient Arrived: Ambulatory Had a fall or experienced change in No Arrival Time: 08:05 activities of daily living that may affect Accompanied By: self risk of falls: Transfer Assistance: None Hospitalized since last visit: No Patient Identification Verified: Yes Has Dressing in Place as Prescribed: Yes Secondary Verification Process Completed: Yes Has Compression in Place as Prescribed: Yes Patient Requires Transmission-Based No Pain Present Now: No Precautions: Patient Has Alerts: Yes Patient Alerts: Patient has reaction to silver dressings. Electronic Signature(s) Signed: 02/28/2021 3:03:55 PM By: Donnamarie Poag Previous Signature: 02/28/2021 12:07:24 PM Version By: Donnamarie Poag Entered By: Donnamarie Poag on 02/28/2021 14:32:05 [name] ([id number]) -------------------------------------------------------------------------------- Clinic Level of Care Assessment Details Patient Name: [name] Date of Service: 02/28/2021 8:00 AM Medical Record Number: [id number] Patient Account Number: [id number] Date of Birth/Sex: 11-05-1980 (40 y.o. M) Treating RN: Donnamarie Poag Primary Care Timmi Devora: [name] Other Clinician: Referring Irelynd Zumstein: [name] Treating Kevyn Boquet/Extender: Yaakov Guthrie in Treatment: 221 Clinic Level of Care Assessment Items TOOL 1 Quantity Score []  - Use when EandM and Procedure is performed on INITIAL visit  0 ASSESSMENTS - Nursing Assessment / Reassessment []  - General Physical Exam (combine w/ comprehensive assessment (listed just below) when performed on new 0 pt. evals) []  - 0 Comprehensive Assessment (HX, ROS, Risk Assessments, Wounds Hx, etc.) ASSESSMENTS - Wound and Skin Assessment / Reassessment []  - Dermatologic / Skin Assessment (not related to wound area) 0 ASSESSMENTS - Ostomy and/or Continence Assessment and Care []  - Incontinence Assessment and Management 0 []  - 0 Ostomy Care Assessment and Management (repouching, etc.) PROCESS - Coordination of Care []  - Simple Patient / Family Education for ongoing care 0 []  - 0 Complex (extensive) Patient / Family Education for ongoing care []  - 0 Staff obtains Programmer, systems, Records, Test Results / Process Orders []  - 0 Staff telephones HHA, Nursing Homes / Clarify orders / etc []  - 0 Routine Transfer to another Facility (non-emergent condition) []  - 0 Routine Hospital Admission (non-emergent condition) []  - 0 New Admissions / Biomedical engineer / Ordering NPWT, Apligraf, etc. []  - 0 Emergency Hospital Admission (emergent condition) PROCESS - Special Needs []  - Pediatric / Minor Patient Management 0 []  - 0 Isolation Patient Management []  - 0 Hearing / Language / Visual special needs []  - 0 Assessment of Community assistance (transportation, D/C planning, etc.) []  - 0 Additional assistance / Altered mentation []  - 0 Support Surface(s) Assessment (bed, cushion, seat, etc.) INTERVENTIONS - Miscellaneous []  - External ear exam 0 []  - 0 Patient Transfer (multiple staff / Civil Service fast streamer / Similar devices) []  - 0 Simple Staple / Suture removal (25 or less) []  - 0 Complex Staple / Suture removal (26 or more) []  - 0 Hypo/Hyperglycemic Management (do not check if billed separately) []  - 0 Ankle / Brachial Index (ABI) - do not check if billed separately Has the patient been seen at the hospital within the last three years:  Yes Total Score: 0 Level Of Care: ____ [name] ([id number]) Electronic Signature(s) Signed: 02/28/2021 3:03:55 PM By: Donnamarie Poag Previous Signature: 02/28/2021 12:07:24 PM Version By: Donnamarie Poag  Entered By: Donnamarie Poag on 02/28/2021 14:32:51 [name], [name] ([id number]) -------------------------------------------------------------------------------- Compression Therapy Details Patient Name: [name], [name] Date of Service: 02/28/2021 8:00 AM Medical Record Number: [id number] Patient Account Number: [id number] Date of Birth/Sex: 24-Sep-1980 (40 y.o. M) Treating RN: Donnamarie Poag Primary Care Edwing Figley: [name] Other Clinician: Referring Ysabel Stankovich: [name] Treating Michelene Keniston/Extender: [name] Cliche in Treatment: 221 Compression Therapy Performed for Wound Assessment: Wound #1 Left,Lateral Lower Leg Performed By: Junius Argyle, RN Compression Type: Four Layer Electronic Signature(s) Signed: 02/28/2021 12:07:24 PM By: Donnamarie Poag Entered By: Donnamarie Poag on 02/28/2021 08:30:35 [name] ([id number]) -------------------------------------------------------------------------------- Encounter Discharge Information Details Patient Name: [name] Date of Service: 02/28/2021 8:00 AM Medical Record Number: [id number] Patient Account Number: [id number] Date of Birth/Sex: [birth year] (40 y.o. M) Treating RN: Donnamarie Poag Primary Care Nai Borromeo: [name] Other Clinician: Referring Alexsis Kathman: [name] Treating Neal Trulson/Extender: Yaakov Guthrie in Treatment: 221 Encounter Discharge Information Items Discharge Condition: Stable Ambulatory Status: Ambulatory Discharge Destination: Home Transportation: Private Auto Accompanied By: self Schedule Follow-up Appointment: Yes Clinical Summary of Care: Electronic Signature(s) Signed: 02/28/2021 3:03:55 PM By: Donnamarie Poag Previous Signature: 02/28/2021 12:07:24 PM Version By: Donnamarie Poag Entered  By: Donnamarie Poag on 02/28/2021 14:32:37 [name], [name] ([id number]) -------------------------------------------------------------------------------- Wound Assessment Details Patient Name: [name] Date of Service: 02/28/2021 8:00 AM Medical Record Number: [id number] Patient Account Number: [id number] Date of Birth/Sex: 09/17/1980 (40 y.o. M) Treating RN: Donnamarie Poag Primary Care Mishael Krysiak: [name] Other Clinician: Referring Bani Gianfrancesco: [name] Treating Beya Tipps/Extender: [name] Cliche in Treatment: 221 Wound Status Wound Number: 1 Primary Etiology: Pyoderma Wound Location: Left, Lateral Lower Leg Wound Status: Open Wounding Event: Gradually Appeared Comorbid History: Sleep Apnea, Hypertension, Colitis Date Acquired: 11/18/2015 Weeks Of Treatment: 221 Clustered Wound: No Wound Measurements Length: (cm) 5.5 Width: (cm) 3.6 Depth: (cm) 0.3 Area: (cm) 15.551 Volume: (cm) 4.665 % Reduction in Area: -216.8% % Reduction in Volume: -18.8% Epithelialization: Medium (34-66%) Wound Description Classification: Full Thickness With Exposed Support Structures Exudate Amount: Large Exudate Type: Serosanguineous Exudate Color: red, brown Foul Odor After Cleansing: No Slough/Fibrino Yes Wound Bed Granulation Amount: Large (67-100%) Exposed Structure Granulation Quality: Red Fascia Exposed: No Necrotic Amount: Small (1-33%) Fat Layer (Subcutaneous Tissue) Exposed: Yes Necrotic Quality: Adherent Slough Tendon Exposed: Yes Muscle Exposed: Yes Necrosis of Muscle: No Joint Exposed: No Bone Exposed: No Treatment Notes Wound #1 (Lower Leg) Wound Laterality: Left, Lateral Cleanser Wound Cleanser Discharge Instruction: Wash your hands with soap and water. Remove old dressing, discard into plastic bag and place into trash. Cleanse the wound with Wound Cleanser prior to applying a clean dressing using gauze sponges, not tissues or cotton balls. Do not scrub or use  excessive force. Pat dry using gauze sponges, not tissue or cotton balls. Peri-Wound Care Moisturizing Lotion Discharge Instruction: Suggestions: Theraderm, Eucerin, Cetaphil, or patient preference. Topical Clobetasol Propionate ointment 0.05%, 60 (g) tube Discharge Instruction: Apply to 7 o'clock depth Primary Dressing Hydrofera Blue Ready Transfer Foam, 4x5 (in/in) Discharge Instruction: Apply Hydrofera Blue Ready to wound bed as directed Secondary Dressing [employer] 4x5 (in/in) [name], [name] ([id number]) Discharge Instruction: Apply to wound as directed. Do not cut. Secured With Compression Wrap Medichoice 4 layer Compression System, 35-40 mmHG Discharge Instruction: Apply multi-layer wrap as directed. Compression Stockings Add-Ons Electronic Signature(s) Signed: 02/28/2021 12:07:24 PM By: Donnamarie Poag Entered ByDonnamarie Poag on 02/28/2021 08:30:07

## 2021-03-02 ENCOUNTER — Other Ambulatory Visit: Payer: Self-pay

## 2021-03-02 DIAGNOSIS — L97222 Non-pressure chronic ulcer of left calf with fat layer exposed: Secondary | ICD-10-CM | POA: Diagnosis not present

## 2021-03-02 NOTE — Progress Notes (Signed)
NATALIO, SALOIS ([id number]) Visit Report for 03/02/2021 Arrival Information Details Patient Name: [name], [name] Date of Service: 03/02/2021 8:00 AM Medical Record Number: [id number] Patient Account Number: [id number] Date of Birth/Sex: 07-Jul-1980 (40 y.o. M) Treating RN: Donnamarie Poag Primary Care Oyinkansola Truax: [name] Other Clinician: Referring Jenevieve Kirschbaum: [name] Treating Florice Hindle/Extender: [name] Cliche in Treatment: 35 Visit Information History Since Last Visit Added or deleted any medications: No Patient Arrived: Ambulatory Had a fall or experienced change in No Arrival Time: 08:01 activities of daily living that may affect Accompanied By: self risk of falls: Transfer Assistance: None Hospitalized since last visit: No Patient Identification Verified: Yes Has Dressing in Place as Prescribed: Yes Secondary Verification Process Completed: Yes Has Compression in Place as Prescribed: Yes Patient Requires Transmission-Based No Pain Present Now: No Precautions: Patient Has Alerts: Yes Patient Alerts: Patient has reaction to silver dressings. Electronic Signature(s) Signed: 03/02/2021 11:51:54 AM By: Donnamarie Poag Entered By: Donnamarie Poag on 03/02/2021 08:15:51 [name] ([id number]) -------------------------------------------------------------------------------- Clinic Level of Care Assessment Details Patient Name: [name], [name] Date of Service: 03/02/2021 8:00 AM Medical Record Number: [id number] Patient Account Number: [id number] Date of Birth/Sex: Feb 17, 1981 (40 y.o. M) Treating RN: Donnamarie Poag Primary Care Bellamy Rubey: [name] Other Clinician: Referring Maycie Luera: [name] Treating Andyn Sales/Extender: [name] Cliche in Treatment: [facility] Level of Care Assessment Items TOOL 1 Quantity Score []  - Use when EandM and Procedure is performed on INITIAL visit 0 ASSESSMENTS - Nursing Assessment / Reassessment []  - General Physical Exam (combine  w/ comprehensive assessment (listed just below) when performed on new 0 pt. evals) []  - 0 Comprehensive Assessment (HX, ROS, Risk Assessments, Wounds Hx, etc.) ASSESSMENTS - Wound and Skin Assessment / Reassessment []  - Dermatologic / Skin Assessment (not related to wound area) 0 ASSESSMENTS - Ostomy and/or Continence Assessment and Care []  - Incontinence Assessment and Management 0 []  - 0 Ostomy Care Assessment and Management (repouching, etc.) PROCESS - Coordination of Care []  - Simple Patient / Family Education for ongoing care 0 []  - 0 Complex (extensive) Patient / Family Education for ongoing care []  - 0 Staff obtains Programmer, systems, Records, Test Results / Process Orders []  - 0 Staff telephones HHA, Nursing Homes / Clarify orders / etc []  - 0 Routine Transfer to another Facility (non-emergent condition) []  - 0 Routine Hospital Admission (non-emergent condition) []  - 0 New Admissions / Biomedical engineer / Ordering NPWT, Apligraf, etc. []  - 0 Emergency Hospital Admission (emergent condition) PROCESS - Special Needs []  - Pediatric / Minor Patient Management 0 []  - 0 Isolation Patient Management []  - 0 Hearing / Language / Visual special needs []  - 0 Assessment of Community assistance (transportation, D/C planning, etc.) []  - 0 Additional assistance / Altered mentation []  - 0 Support Surface(s) Assessment (bed, cushion, seat, etc.) INTERVENTIONS - Miscellaneous []  - External ear exam 0 []  - 0 Patient Transfer (multiple staff / Civil Service fast streamer / Similar devices) []  - 0 Simple Staple / Suture removal (25 or less) []  - 0 Complex Staple / Suture removal (26 or more) []  - 0 Hypo/Hyperglycemic Management (do not check if billed separately) []  - 0 Ankle / Brachial Index (ABI) - do not check if billed separately Has the patient been seen at the hospital within the last three years: Yes Total Score: 0 Level Of Care: ____ [name] ([id number]) Electronic  Signature(s) Signed: 03/02/2021 11:51:54 AM By: Donnamarie Poag Entered By: Donnamarie Poag on 03/02/2021 08:18:01 [name], [name] ([id number]) -------------------------------------------------------------------------------- Compression Therapy Details Patient Name: [name]  Date of Service: 03/02/2021 8:00 AM Medical Record Number: [id number] Patient Account Number: [id number] Date of Birth/Sex: 20-Oct-1980 (40 y.o. M) Treating RN: Donnamarie Poag Primary Care Eder Macek: [name] Other Clinician: Referring Gabreal Worton: [name] Treating Adaleah Forget/Extender: [name] Cliche in Treatment: 221 Compression Therapy Performed for Wound Assessment: Wound #1 Left,Lateral Lower Leg Performed By: Junius Argyle, RN Compression Type: Four Layer Electronic Signature(s) Signed: 03/02/2021 11:51:54 AM By: Donnamarie Poag Entered By: Donnamarie Poag on 03/02/2021 08:16:33 [name] ([id number]) -------------------------------------------------------------------------------- Encounter Discharge Information Details Patient Name: [name] Date of Service: 03/02/2021 8:00 AM Medical Record Number: [id number] Patient Account Number: [id number] Date of Birth/Sex: 10-Oct-1980 (40 y.o. M) Treating RN: Donnamarie Poag Primary Care Khyli Swaim: [name] Other Clinician: Referring Tresten Pantoja: [name] Treating Weyman Bogdon/Extender: [name] Cliche in Treatment: 221 Encounter Discharge Information Items Discharge Condition: Stable Ambulatory Status: Ambulatory Discharge Destination: Home Transportation: Private Auto Accompanied By: self Schedule Follow-up Appointment: Yes Clinical Summary of Care: Electronic Signature(s) Signed: 03/02/2021 11:51:54 AM By: Donnamarie Poag Entered By: Donnamarie Poag on 03/02/2021 08:17:53 [name], [name] ([id number]) -------------------------------------------------------------------------------- Wound Assessment Details Patient Name: [name] Date of Service:  03/02/2021 8:00 AM Medical Record Number: [id number] Patient Account Number: [id number] Date of Birth/Sex: [birth year] (40 y.o. M) Treating RN: Donnamarie Poag Primary Care Melvinia Ashby: [name] Other Clinician: Referring Kayin Kettering: [name] Treating Travez Stancil/Extender: [name] Cliche in Treatment: 221 Wound Status Wound Number: 1 Primary Etiology: Pyoderma Wound Location: Left, Lateral Lower Leg Wound Status: Open Wounding Event: Gradually Appeared Comorbid History: Sleep Apnea, Hypertension, Colitis Date Acquired: 11/18/2015 Weeks Of Treatment: 221 Clustered Wound: No Wound Measurements Length: (cm) 5.5 Width: (cm) 3.6 Depth: (cm) 0.3 Area: (cm) 15.551 Volume: (cm) 4.665 % Reduction in Area: -216.8% % Reduction in Volume: -18.8% Epithelialization: Medium (34-66%) Wound Description Classification: Full Thickness With Exposed Support Structures Exudate Amount: Large Exudate Type: Serosanguineous Exudate Color: red, brown Foul Odor After Cleansing: No Slough/Fibrino Yes Wound Bed Granulation Amount: Large (67-100%) Exposed Structure Granulation Quality: Red Fascia Exposed: No Necrotic Amount: Small (1-33%) Fat Layer (Subcutaneous Tissue) Exposed: Yes Necrotic Quality: Adherent Slough Tendon Exposed: Yes Muscle Exposed: Yes Necrosis of Muscle: No Joint Exposed: No Bone Exposed: No Treatment Notes Wound #1 (Lower Leg) Wound Laterality: Left, Lateral Cleanser Wound Cleanser Discharge Instruction: Wash your hands with soap and water. Remove old dressing, discard into plastic bag and place into trash. Cleanse the wound with Wound Cleanser prior to applying a clean dressing using gauze sponges, not tissues or cotton balls. Do not scrub or use excessive force. Pat dry using gauze sponges, not tissue or cotton balls. Peri-Wound Care Moisturizing Lotion Discharge Instruction: Suggestions: Theraderm, Eucerin, Cetaphil, or patient preference. Topical Clobetasol  Propionate ointment 0.05%, 60 (g) tube Discharge Instruction: Apply to 7 o'clock depth Primary Dressing Hydrofera Blue Ready Transfer Foam, 4x5 (in/in) Discharge Instruction: Apply Hydrofera Blue Ready to wound bed as directed Secondary Dressing [employer] 4x5 (in/in) YAMIL, OELKE ([id number]) Discharge Instruction: Apply to wound as directed. Do not cut. Secured With Compression Wrap Medichoice 4 layer Compression System, 35-40 mmHG Discharge Instruction: Apply multi-layer wrap as directed. Compression Stockings Add-Ons Electronic Signature(s) Signed: 03/02/2021 11:51:54 AM By: Donnamarie Poag Entered ByDonnamarie Poag on 03/02/2021 08:16:13

## 2021-03-05 ENCOUNTER — Other Ambulatory Visit: Payer: Self-pay

## 2021-03-05 ENCOUNTER — Encounter: Payer: 59 | Admitting: Physician Assistant

## 2021-03-05 DIAGNOSIS — L97222 Non-pressure chronic ulcer of left calf with fat layer exposed: Secondary | ICD-10-CM | POA: Diagnosis not present

## 2021-03-05 NOTE — Progress Notes (Signed)
[name], [name] ([id number]) Visit Report for 03/05/2021 Arrival Information Details Patient Name: [name], [name] Date of Service: 03/05/2021 8:15 AM Medical Record Number: [id number] Patient Account Number: [id number] Date of Birth/Sex: March 23, 1981 (40 y.o. M) Treating RN: Dolan Amen Primary Care Caryl Fate: [name] Other Clinician: Referring Astin Sayre: [name] Treating Theon Sobotka/Extender: [name] Cliche in Treatment: 61 Visit Information History Since Last Visit Pain Present Now: No Patient Arrived: Ambulatory Arrival Time: 08:17 Accompanied By: self Transfer Assistance: None Patient Identification Verified: Yes Secondary Verification Process Completed: Yes Patient Requires Transmission-Based No Precautions: Patient Has Alerts: Yes Patient Alerts: Patient has reaction to silver dressings. Electronic Signature(s) Signed: 03/05/2021 12:01:13 PM By: Dolan Amen RN Entered By: Dolan Amen on 03/05/2021 08:17:51 [name] ([id number]) -------------------------------------------------------------------------------- Clinic Level of Care Assessment Details Patient Name: [name], [name] Date of Service: 03/05/2021 8:15 AM Medical Record Number: [id number] Patient Account Number: [id number] Date of Birth/Sex: 1981-04-09 (40 y.o. M) Treating RN: Dolan Amen Primary Care Fritzi Scripter: [name] Other Clinician: Referring Consuello Lassalle: [name] Treating Galileah Piggee/Extender: [name] Cliche in Treatment: 221 Clinic Level of Care Assessment Items TOOL 1 Quantity Score []  - Use when EandM and Procedure is performed on INITIAL visit 0 ASSESSMENTS - Nursing Assessment / Reassessment []  - General Physical Exam (combine w/ comprehensive assessment (listed just below) when performed on new 0 pt. evals) []  - 0 Comprehensive Assessment (HX, ROS, Risk Assessments, Wounds Hx, etc.) ASSESSMENTS - Wound and Skin Assessment / Reassessment []  - Dermatologic / Skin  Assessment (not related to wound area) 0 ASSESSMENTS - Ostomy and/or Continence Assessment and Care []  - Incontinence Assessment and Management 0 []  - 0 Ostomy Care Assessment and Management (repouching, etc.) PROCESS - Coordination of Care []  - Simple Patient / Family Education for ongoing care 0 []  - 0 Complex (extensive) Patient / Family Education for ongoing care []  - 0 Staff obtains Programmer, systems, Records, Test Results / Process Orders []  - 0 Staff telephones HHA, Nursing Homes / Clarify orders / etc []  - 0 Routine Transfer to another Facility (non-emergent condition) []  - 0 Routine Hospital Admission (non-emergent condition) []  - 0 New Admissions / Biomedical engineer / Ordering NPWT, Apligraf, etc. []  - 0 Emergency Hospital Admission (emergent condition) PROCESS - Special Needs []  - Pediatric / Minor Patient Management 0 []  - 0 Isolation Patient Management []  - 0 Hearing / Language / Visual special needs []  - 0 Assessment of Community assistance (transportation, D/C planning, etc.) []  - 0 Additional assistance / Altered mentation []  - 0 Support Surface(s) Assessment (bed, cushion, seat, etc.) INTERVENTIONS - Miscellaneous []  - External ear exam 0 []  - 0 Patient Transfer (multiple staff / Civil Service fast streamer / Similar devices) []  - 0 Simple Staple / Suture removal (25 or less) []  - 0 Complex Staple / Suture removal (26 or more) []  - 0 Hypo/Hyperglycemic Management (do not check if billed separately) []  - 0 Ankle / Brachial Index (ABI) - do not check if billed separately Has the patient been seen at the hospital within the last three years: Yes Total Score: 0 Level Of Care: ____ [name] ([id number]) Electronic Signature(s) Signed: 03/05/2021 12:01:13 PM By: Dolan Amen RN Entered By: Dolan Amen on 03/05/2021 09:09:29 [name] ([id number]) -------------------------------------------------------------------------------- Compression Therapy  Details Patient Name: [name] Date of Service: 03/05/2021 8:15 AM Medical Record Number: [id number] Patient Account Number: [id number] Date of Birth/Sex: 11/06/1980 (40 y.o. M) Treating RN: Dolan Amen Primary Care Andrianna Manalang: [name] Other Clinician: Referring Dakoda Bassette: [name] Treating Myers Tutterow/Extender: [name] Cliche  in Treatment: 221 Compression Therapy Performed for Wound Assessment: Wound #1 Left,Lateral Lower Leg Performed By: Cora Daniels, RN Compression Type: Four Layer Pre Treatment ABI: 1.2 Post Procedure Diagnosis Same as Pre-procedure Electronic Signature(s) Signed: 03/05/2021 12:01:13 PM By: Dolan Amen RN Entered By: Dolan Amen on 03/05/2021 08:36:48 [name] ([id number]) -------------------------------------------------------------------------------- Encounter Discharge Information Details Patient Name: [name] Date of Service: 03/05/2021 8:15 AM Medical Record Number: [id number] Patient Account Number: [id number] Date of Birth/Sex: December 05, 1980 (40 y.o. M) Treating RN: Dolan Amen Primary Care Ronn Smolinsky: [name] Other Clinician: Referring Leaann Nevils: [name] Treating Jailin Manocchio/Extender: [name] Cliche in Treatment: 221 Encounter Discharge Information Items Discharge Condition: Stable Ambulatory Status: Ambulatory Discharge Destination: Home Transportation: Private Auto Accompanied By: self Schedule Follow-up Appointment: Yes Clinical Summary of Care: Electronic Signature(s) Signed: 03/05/2021 12:01:13 PM By: Dolan Amen RN Entered By: Dolan Amen on 03/05/2021 09:10:51 [name] ([id number]) -------------------------------------------------------------------------------- Lower Extremity Assessment Details Patient Name: [name] Date of Service: 03/05/2021 8:15 AM Medical Record Number: [id number] Patient Account Number: [id number] Date of Birth/Sex: [birth year] (40 y.o.  M) Treating RN: Dolan Amen Primary Care Jya Hughston: [name] Other Clinician: Referring Toia Micale: [name] Treating Kasiah Manka/Extender: Jeri Cos Weeks in Treatment: 221 Edema Assessment Assessed: [Left: Yes] [Right: No] Edema: [Left: Ye] [Right: s] Calf Left: Right: Point of Measurement: 33 cm From Medial Instep 47.2 cm Ankle Left: Right: Point of Measurement: 12 cm From Medial Instep 28.5 cm Electronic Signature(s) Signed: 03/05/2021 12:01:13 PM By: Dolan Amen RN Entered By: Dolan Amen on 03/05/2021 08:31:46 [name], [name] ([id number]) -------------------------------------------------------------------------------- Multi Wound Chart Details Patient Name: [name] Date of Service: 03/05/2021 8:15 AM Medical Record Number: [id number] Patient Account Number: [id number] Date of Birth/Sex: [birth year] (40 y.o. M) Treating RN: Dolan Amen Primary Care Sharla Tankard: [name] Other Clinician: Referring Lethia Donlon: [name] Treating Kenyonna Micek/Extender: [name] Cliche in Treatment: 221 Vital Signs Height(in): 71 Pulse(bpm): 81 Weight(lbs): 338 Blood Pressure(mmHg): 143/84 Body Mass Index(BMI): 47 Temperature(F): 98.3 Respiratory Rate(breaths/min): 18 Photos: [N/A:N/A] Wound Location: Left, Lateral Lower Leg N/A N/A Wounding Event: Gradually Appeared N/A N/A Primary Etiology: Pyoderma N/A N/A Comorbid History: Sleep Apnea, Hypertension, Colitis N/A N/A Date Acquired: 11/18/2015 N/A N/A Weeks of Treatment: 221 N/A N/A Wound Status: Open N/A N/A Measurements L x W x D (cm) 5.2x3.6x0.4 N/A N/A Area (cm) : 14.703 N/A N/A Volume (cm) : 5.881 N/A N/A % Reduction in Area: -199.50% N/A N/A % Reduction in Volume: -49.80% N/A N/A Classification: Full Thickness With Exposed N/A N/A Support Structures Exudate Amount: Medium N/A N/A Exudate Type: Serosanguineous N/A N/A Exudate Color: red, brown N/A N/A Granulation Amount: Large (67-100%)  N/A N/A Granulation Quality: Red N/A N/A Necrotic Amount: Small (1-33%) N/A N/A Exposed Structures: Fat Layer (Subcutaneous Tissue): N/A N/A Yes Tendon: Yes Muscle: Yes Fascia: No Joint: No Bone: No Epithelialization: Medium (34-66%) N/A N/A Treatment Notes Electronic Signature(s) Signed: 03/05/2021 12:01:13 PM By: Dolan Amen RN Entered By: Dolan Amen on 03/05/2021 08:35:49 [name] ([id number]) -------------------------------------------------------------------------------- Multi-Disciplinary Care Plan Details Patient Name: [name] Date of Service: 03/05/2021 8:15 AM Medical Record Number: [id number] Patient Account Number: [id number] Date of Birth/Sex: 1981-04-20 (40 y.o. M) Treating RN: Dolan Amen Primary Care Morrigan Wickens: [name] Other Clinician: Referring Antanette Richwine: [name] Treating Lilit Cinelli/Extender: [name] Cliche in Treatment: Metzger reviewed with physician Active Inactive Electronic Signature(s) Signed: 03/05/2021 12:01:13 PM By: Dolan Amen RN Entered By: Dolan Amen on 03/05/2021 08:35:37 [name] ([id number]) -------------------------------------------------------------------------------- Pain Assessment Details Patient Name: [name] Date  of Service: 03/05/2021 8:15 AM Medical Record Number: [id number] Patient Account Number: [id number] Date of Birth/Sex: 1980-10-04 (40 y.o. M) Treating RN: Dolan Amen Primary Care Kian Gamarra: [name] Other Clinician: Referring Kylina Vultaggio: [name] Treating Tandi Hanko/Extender: [name] Cliche in Treatment: 221 Active Problems Location of Pain Severity and Description of Pain Patient Has Paino No Site Locations Pain Management and Medication Current Pain Management: Electronic Signature(s) Signed: 03/05/2021 12:01:13 PM By: Dolan Amen RN Entered By: Dolan Amen on 03/05/2021 08:19:40 [name]  ([id number]) -------------------------------------------------------------------------------- Patient/Caregiver Education Details Patient Name: [name] Date of Service: 03/05/2021 8:15 AM Medical Record Number: [id number] Patient Account Number: [id number] Date of Birth/Gender: January 12, 1981 (40 y.o. M) Treating RN: Dolan Amen Primary Care Physician: [name] Other Clinician: Referring Physician: [name] Treating Physician/Extender: [name] Cliche in Treatment: 2 Education Assessment Education Provided To: Patient Education Topics Provided Wound/Skin Impairment: Methods: Explain/Verbal Responses: State content correctly Electronic Signature(s) Signed: 03/05/2021 12:01:13 PM By: Dolan Amen RN Entered By: Dolan Amen on 03/05/2021 09:10:10 [name] ([id number]) -------------------------------------------------------------------------------- Wound Assessment Details Patient Name: [name] Date of Service: 03/05/2021 8:15 AM Medical Record Number: [id number] Patient Account Number: [id number] Date of Birth/Sex: [birth year] (40 y.o. M) Treating RN: Dolan Amen Primary Care Kemia Wendel: [name] Other Clinician: Referring Tereka Thorley: [name] Treating Dyneshia Baccam/Extender: [name] Cliche in Treatment: 221 Wound Status Wound Number: 1 Primary Etiology: Pyoderma Wound Location: Left, Lateral Lower Leg Wound Status: Open Wounding Event: Gradually Appeared Comorbid History: Sleep Apnea, Hypertension, Colitis Date Acquired: 11/18/2015 Weeks Of Treatment: 221 Clustered Wound: No Photos Wound Measurements Length: (cm) 5.2 Width: (cm) 3.6 Depth: (cm) 0.4 Area: (cm) 14.703 Volume: (cm) 5.881 % Reduction in Area: -199.5% % Reduction in Volume: -49.8% Epithelialization: Medium (34-66%) Tunneling: No Undermining: No Wound Description Classification: Full Thickness With Exposed Support Structures Exudate Amount: Medium Exudate  Type: Serosanguineous Exudate Color: red, brown Foul Odor After Cleansing: No Slough/Fibrino Yes Wound Bed Granulation Amount: Large (67-100%) Exposed Structure Granulation Quality: Red Fascia Exposed: No Necrotic Amount: Small (1-33%) Fat Layer (Subcutaneous Tissue) Exposed: Yes Necrotic Quality: Adherent Slough Tendon Exposed: Yes Muscle Exposed: No Joint Exposed: No Bone Exposed: No Treatment Notes Wound #1 (Lower Leg) Wound Laterality: Left, Lateral Cleanser Wound Cleanser Discharge Instruction: Wash your hands with soap and water. Remove old dressing, discard into plastic bag and place into trash. Cleanse the wound with Wound Cleanser prior to applying a clean dressing using gauze sponges, not tissues or cotton balls. Do not scrub or use excessive force. Pat dry using gauze sponges, not tissue or cotton balls. [name], [name] ([id number]) Warren Lotion Discharge Instruction: Suggestions: Theraderm, Eucerin, Cetaphil, or patient preference. Topical Clobetasol Propionate ointment 0.05%, 60 (g) tube Discharge Instruction: Apply to 7 o'clock depth Primary Dressing Hydrofera Blue Ready Transfer Foam, 4x5 (in/in) Discharge Instruction: Apply Hydrofera Blue Ready to wound bed as directed Secondary Dressing Xtrasorb Medium 4x5 (in/in) Discharge Instruction: Apply to wound as directed. Do not cut. Secured With Compression Wrap Medichoice 4 layer Compression System, 35-40 mmHG Discharge Instruction: Apply multi-layer wrap as directed. Compression Stockings Add-Ons Electronic Signature(s) Signed: 03/05/2021 12:01:13 PM By: Dolan Amen RN Entered By: Dolan Amen on 03/05/2021 08:37:24 [name], [name] ([id number]) -------------------------------------------------------------------------------- Vitals Details Patient Name: [name] Date of Service: 03/05/2021 8:15 AM Medical Record Number: [id number] Patient Account Number: [id number] Date of  Birth/Sex: [birth year] (40 y.o. M) Treating RN: Dolan Amen Primary Care Ethelda Deangelo: [name] Other Clinician: Referring Marven Veley: [name] Treating Jaydan Meidinger/Extender: [name] Cliche in Treatment: 221 Vital Signs Time  Taken: 08:18 Temperature (F): 98.3 Height (in): 71 Pulse (bpm): 81 Weight (lbs): 338 Respiratory Rate (breaths/min): 18 Body Mass Index (BMI): 47.1 Blood Pressure (mmHg): 143/84 Reference Range: 80 - 120 mg / dl Electronic Signature(s) Signed: 03/05/2021 12:01:13 PM By: Dolan Amen RN Entered By: Dolan Amen on 03/05/2021 08:19:27

## 2021-03-05 NOTE — Progress Notes (Signed)
[name], [name] ([id number]) Visit Report for 03/05/2021 Chief Complaint Document Details Patient Name: [name], [name] Date of Service: 03/05/2021 8:15 AM Medical Record Number: [id number] Patient Account Number: [id number] Date of Birth/Sex: 05-Jul-1980 (40 y.o. 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 M) Treating RN: Dolan Amen Primary Care Provider: [name] Other Clinician: Referring Provider: [name] Treating Provider/Extender: [name] Cliche in Treatment: 63 History of Present Illness HPI Description: 12/04/16; 40 year old man who comes into the clinic today for review of a wound on the posterior left calf. He tells me that is been there for about a year. He is not a diabetic he does smoke half a pack per day. He was seen in the ER on 11/20/16 felt to have cellulitis around the wound and was given clindamycin. An x-ray did not show osteomyelitis. The patient initially tells me that he has a milk allergy that sets off a pruritic itching rash on his lower legs which she scratches incessantly and he thinks that's what may have set up the wound. He has been using various topical antibiotics and ointments without any effect. He works in a [employer] and is on his feet all day. He does not have a  prior history of wounds however he does have the rash on both lower legs the right arm and the ventral aspect of his left arm. These are excoriations and clearly have had scratching however there are of macular looking areas on both legs including a substantial larger area on the right leg. This does not have an underlying open area. There is no blistering. The patient tells me that 2 years ago in [place] in response to the rash on his legs he saw a dermatologist who told him he had a condition which may be pyoderma gangrenosum although I may be putting words into his mouth. He seemed to recognize this. On further questioning he admits to a 5 year history of quiesced. ulcerative colitis. He is not in any treatment for this. He's had no recent travel 12/11/16; the patient arrives today with his wound and roughly the same condition we've been using silver alginate this is a deep punched out wound with some surrounding erythema but no tenderness. Biopsy I did did not show confirmed pyoderma gangrenosum suggested nonspecific inflammation and vasculitis but does not provide an actual description of what was seen by the pathologist. I'm really not able to understand this We have also received information from the patient's dermatologist in [place] notes from April 2016. This was a doctor Agarwal-antal. 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 He was prescribed topical steroid high potency under occlusion which helped but at this point the patient did not have a deep punched out wound. 12/18/16; the patient's wound is larger in terms of surface area however this surface looks better and there is less depth. The surrounding erythema also is better. The patient states that the wrap we put on came off 2 days ago when he has been using his compression stockings.  He we are in the process of getting a dermatology consult. 12/26/16 on evaluation today patient's left lower extremity wound shows evidence of  infection with surrounding erythema noted. He has been tolerating the dressing changes but states that he has noted more discomfort. There is a larger area of erythema surrounding the wound. No fevers, chills, nausea, or vomiting noted at this time. With that being said the wound still does have slough covering the surface. He is not allergic to any medication that he is aware of at this point. In regard to his right lower extremity he had several regions that are erythematous and pruritic he wonders if there's anything we can do to help that. 01/02/17 I reviewed patient's wound culture which was obtained his visit last week. He was placed on doxycycline at that point. Unfortunately that does not appear to be an antibiotic that would likely help with the situation however the pseudomonas noted on culture is sensitive to Cipro. Also unfortunately patient's wound seems to have a large compared to last week's evaluation. Not severely so but there are definitely increased measurements in general. He is continuing to have discomfort as well he writes this to be a seven out of 10. In fact he would prefer me not to perform any debridement today due to the fact that he is having discomfort and considering he has an active infection on the little reluctant to do so anyway. No fevers, chills, nausea, or vomiting noted at this time. 01/08/17; patient seems dermatology on September 5. I suspect dermatology will want the slides from the biopsy I did sent to their pathologist. I'm not sure if there is a way we can expedite that. In any case the culture I did before I left on vacation 3 weeks ago showed Pseudomonas he was given 10 days of Cipro and per her description of her intake nurses is actually somewhat better this week although the wound is quite a bit bigger than I remember the last time I saw this. He still has 3 more days of Cipro 01/21/17; dermatology appointment tomorrow. He has completed the ciprofloxacin  for Pseudomonas. Surface of the wound looks better however he is had some deterioration in the lesions on his right leg. Meantime the left lateral leg wound we will continue with sample 01/29/17; patient had his dermatology appointment but I can't yet see that note. He is completed his antibiotics. The wound is more superficial but considerably larger in circumferential area than when he came in. This is in his left lateral calf. He also has swollen erythematous areas with superficial wounds on the right leg and small papular areas on both arms. There apparently areas in her his upper thighs and buttocks I did not look at those. Dermatology biopsied the right leg. Hopefully will have their input next week. 02/05/17; patient went back to see his dermatologist who told him that he had a "scratching problem" as well as staph. He is now on a 30 day course of doxycycline and I believe she gave him triamcinolone cream to the right leg areas to help with the itching [not exactly sure but probably triamcinolone]. She apparently looked at the left lateral leg wound although this was not rebiopsied and I think felt to be ultimately part of the same pathogenesis. He is using sample border foam and changing nevus himself. He now has a new open area on the right posterior leg which was his biopsy site I don't have any of the dermatology notes  02/12/17; we put the patient in compression last week with SANTYL to the wound on the left leg and the biopsy. Edema is much better and the depth of the wound is now at level of skin. Area is still the same oBiopsy site on the right lateral leg we've also been using santyl with a border foam dressing and he is changing this himself. 02/19/17; Using silver alginate started last week to both the substantial left leg wound and the biopsy site on the right wound. He is tolerating compression well. Has a an appointment with his primary M.D. tomorrow wondering about diuretics although  I'm wondering if the edema problem is actually lymphedema 02/26/17; the patient has been to see his primary doctor Dr. Jerrel Ivory at [place] our primary care. She started him on Lasix 20 mg and this seems to have helped with the edema. However we are not making substantial change with the left lateral calf wound and inflammation. The biopsy site on the right leg also looks stable but not really all that different. 03/12/17; the patient has been to see vein and vascular Dr. Lucky Cowboy. He has had venous reflux studies I have not reviewed these. I did get a call from his dermatology office. They felt that he might have pathergy based on their biopsy on his right leg which led them to look at the slides of [name], [name] ([id number]) the biopsy I did on the left leg and they wonder whether this represents pyoderma gangrenosum which was the original supposition in a man with ulcerative colitis albeit inactive for many years. They therefore recommended clobetasol and tetracycline i.e. 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 03/26/17; apparently the patient just had reflux studies not an appointment with Dr. dew. She arrives in clinic today having applied clobetasol for 2-3 weeks. He notes over the last 2-3 days excessive drainage having to change the dressing 3-4 times a day and also expanding erythema. 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 Lillia Carmel at [facility] dermatology locally who kindly saw him at my request. 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 My concern was the degree of erythema medially extending from his left leg wound which was either inflammation from pyoderma  or cellulitis. I put him on Augmentin however culture of the wound showed Pseudomonas which is quinolone sensitive. I really don't believe he has cellulitis however in view of everything I will continue and give him a course of Cipro. He is also on doxycycline as an immune modulator for the pyoderma. In addition to his original wound on the left lateral leg with surrounding erythema he has a wound on the right posterior calf which was an original biopsy site done by dermatology. 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 Saw Dr. Lillia Carmel yesterday. He has been using topical antibiotics to both wound areas his original wound on the left and the biopsies/pathergy area on the right. There is definitely some improvement in the inflammation around the wound on the right although the patient states he has increasing sensitivity of the wounds. He is on prednisone 60 and doxycycline 1 as prescribed by Dr. Lillia Carmel. He is covering the topical antibiotic with gauze  and putting this in his own compression stocks and changing this daily. He states that Dr. Lottie Rater did a culture of the left leg wound yesterday 05/07/17; pyoderma gangrenosum. The patient saw Dr. Lillia Carmel yesterday and has a follow-up with her in one month. He is still using topical antibiotics to both wounds although he can't recall exactly what type. He is still on prednisone 60 mg. Dr. Lillia Carmel stated that the doxycycline could stop if we were in agreement. 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 The right medial leg was induced by biopsy/pathergy. The area on the right is essentially healed. 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 The right medial leg has closed and remains closed. He  is still on prednisone 60. oHe tells me he missed his last dermatology appointment with Dr. Lillia Carmel but will make another appointment. He reports that her blood sugar at a recent screen in [place] was high 200's. He was 180 today. 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 I think he is going to require still much longer prednisone perhaps another 3 months before attempting to taper. In the meantime his wound is a lot better. Smaller. He is cleaning this off daily and applying topical antibiotics. When he was last in the clinic I thought about changing to [facility] and actually put in a couple of calls to dermatology although probably not during their business hours. 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 He continues on oral prednisone. He has been using triple antibiotic ointment. There is surface debris and we will transition to [name] and have him return in 2 weeks. He has lost 30 pounds since his last appointment with lifestyle modification. He may benefit from topical steroid cream for treatment this can be considered at a later date. 08/22/17 on evaluation today patient appears to actually be doing rather well in regard to his left lateral lower extremity ulcer. He has actually been managed by Dr. Dellia Nims most recently. Patient is currently on oral steroids at this time. This seems to have been of benefit for him. Nonetheless his last visit was actually with Leah on 08/06/17. Currently he is not utilizing any topical steroid creams although this could be of benefit as well. No fevers, chills, nausea, or vomiting noted at this time. 09/05/17 on evaluation today patient appears to be doing better in regard to his left lateral lower extremity ulcer. He has been tolerating the dressing changes without complication. He is using Santyl with good effect. Overall I'm  very pleased with how things are standing at this point. Patient likewise is happy that this is doing better. 09/19/17 on evaluation today patient actually appears to be doing rather well in regard to his left lateral lower extremity ulcer. 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 He's not having any significant pain. 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 He has been tolerating the Santyl without complication and in general I feel like he's making good progress. 10/17/17 on evaluation today patient  appears to be doing very well in regard to his left lateral lower surety ulcer. He has been tolerating the dressing changes without complication. There does not appear to be any evidence of infection he's alternating the Santyl and the triple antibiotic ointment every other day this seems to be doing well for him. 11/03/17 on evaluation today patient appears to be doing very well in regard to his left lateral lower extremity ulcer. He is been tolerating the dressing changes without complication which is good news. Fortunately there does not appear to be any evidence of infection which is also great news. Overall is doing excellent they are starting to taper down on the prednisone is down to 40 mg at this point it also started topical clobetasol for him. 11/17/17 on evaluation today patient appears to be doing well in regard to his left lateral lower surety ulcer. He's been tolerating the dressing changes without complication. He does note that he is having no pain, no excessive drainage or discharge, and overall he feels like things are going about how he would expect and hope they would. Overall he seems to have no evidence of infection at this time in my opinion which is good news. 12/04/17-He is seen in follow-up  evaluation for right lateral lower extremity ulcer. He has been applying topical steroid cream. Today's measurement show slight increase in size. Over the next 2 weeks we will transition to every other day Santyl and steroid cream. He has been encouraged to monitor for changes and notify clinic with any concerns 12/15/17 on evaluation today patient's left lateral motion the ulcer and fortunately is doing worse again at this point. This just since last week to this week has close to doubled in size according to the patient. I did not seeing last week's I do not have a visual to compare this to in our system was also down so we do not have all the charts and at this point. Nonetheless it does have me somewhat concerned in regard to the fact that again he was worried enough about it he has contact the dermatology that placed them back on the full strength, 50 mg a day of the prednisone that he was taken previous. He continues to alternate using clobetasol along with Santyl at this point. He is obviously somewhat frustrated. 12/22/17 on evaluation today patient appears to be doing a little worse compared to last evaluation. Unfortunately the wound is a little deeper and slightly larger than the last week's evaluation. With that being said he has made some progress in regard to the irritation surrounding at this time unfortunately despite that progress that's been made he still has a significant issue going on here. 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 [name], [name] ([id number]) For that reason I discussed with him today the possibility of perform a wound culture to ensure there's nothing overtly infected. 01/06/18 on evaluation today patient's wound is larger and deeper than previously evaluated. With that being said it did appear that his wound was  infected after my last evaluation with him. Subsequently I did end up prescribing a prescription for Bactrim DS which she has been taking and having no complication with. Fortunately there does not appear to be any evidence of infection at this point in time as far as anything spreading, no want to touch, and overall I feel like  things are showing signs of improvement. 01/13/18 on evaluation today patient appears to be even a little larger and deeper than last time. There still muscle exposed in the base of the wound. Nonetheless he does appear to be less erythematous I do believe inflammation is calming down also believe the infection looks like it's probably resolved at this time based on what I'm seeing. No fevers, chills, nausea, or vomiting noted at this time. 01/30/18 on evaluation today patient actually appears to visually look better for the most part. Unfortunately those visually this looks better he does seem to potentially have what may be an abscess in the muscle that has been noted in the central portion of the wound. This is the first time that I have noted what appears to be fluctuance in the central portion of the muscle. With that being said I'm somewhat more concerned about the fact that this might indicate an abscess formation at this location. I do believe that an ultrasound would be appropriate. This is likely something we need to try to do as soon as possible. He has been switch to mupirocin ointment and he is no longer using the steroid ointment as prescribed by dermatology he sees them again next week he's been decreased from 60 to 40 mg of prednisone. 03/09/18 on evaluation today patient actually appears to be doing a little better compared to last time I saw him. There's not as much erythema surrounding the wound itself. He I did review his most recent infectious disease note which was dated 02/24/18. He saw Dr. Michel Bickers in [place]. 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 Patient is now off of antibiotics and they are continually observing this. There seems to be no change in the past two weeks in my pinion based on what the patient says and what I see today compared to what Dr. Megan Salon likely saw two weeks ago. No fevers, chills, nausea, or vomiting noted at this time. 03/23/18 on evaluation today patient's wound actually appears to be showing signs of improvement which is good news. He is currently still on the Dapsone. He is also working on tapering the prednisone to get off of this and Dr. Lottie Rater is working with him in this regard. Nonetheless overall I feel like the wound is doing well it does appear based on the infectious disease note that I reviewed from Dr. 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 I did also review the results of his ultrasound of left lower extremity which revealed there was a dentist tissue in the base of the wound without an abscess noted. 04/06/18 on evaluation today the patient's left lateral lower extremity ulcer actually appears to be doing fairly well which is excellent news. There does not appear to be any evidence of infection at this time which is also great news. Overall he still does have a significantly large ulceration although little by little he seems to be making progress. He is down to 10 mg a day of the prednisone. 04/20/18 on evaluation today patient actually appears to be doing excellent at this time in regard to his left lower extremity ulcer. He's making signs of good progress unfortunately this is taking much longer than we would really like to see but nonetheless he is  making progress. Fortunately there does not appear to be any evidence of infection at this time. No fevers, chills, nausea, or vomiting noted at  this time. The patient has not been using the Santyl due to the cost he hadn't got in this field yet. He's mainly been using the antibiotic ointment topically. Subsequently he also tells me that he really has not been scrubbing in the shower I think this would be helpful again as I told him it doesn't have to be anything too aggressive to even make it believe just enough to keep it free of some of the loose slough and biofilm on the wound surface. 05/11/18 on evaluation today patient's wound appears to be making slow but sure progress in regard to the left lateral lower extremity ulcer. He is been tolerating the dressing changes without complication. Fortunately there does not appear to be any evidence of infection at this time. He is still just using triple antibiotic ointment along with clobetasol occasionally over the area. He never got the Santyl and really does not seem to intend to in my pinion. 06/01/18 on evaluation today patient appears to be doing a little better in regard to his left lateral lower extremity ulcer. He states that overall he does not feel like he is doing as well with the Dapsone as he did with the prednisone. Nonetheless he sees his dermatologist later today and is gonna talk to them about the possibility of going back on the prednisone. Overall again I believe that the wound would be better if you would utilize Santyl but he really does not seem to be interested in going back to the [place] at this point. He has been using triple antibiotic ointment. 06/15/18 on evaluation today patient's wound actually appears to be doing about the same at this point. Fortunately there is no signs of infection at this time. He has made slight improvements although he continues to not really want to clean the wound bed at this point. He states that he just doesn't mess with it he doesn't want to cause any problems with everything else he has going on. He has been on medication, antibiotics  as prescribed by his dermatologist, for a staff infection of his lower extremities which is really drying out now and looking much better he tells me. Fortunately there is no sign of overall infection. 06/29/18 on evaluation today patient appears to be doing well in regard to his left lateral lower surety ulcer all things considering. Fortunately his staff infection seems to be greatly improved compared to previous. He has no signs of infection and this is drying up quite nicely. He is still the doxycycline for this is no longer on cental, Dapsone, or any of the other medications. His dermatologist has recommended possibility of an infusion but right now he does not want to proceed with that. 07/13/18 on evaluation today patient appears to be doing about the same in regard to his left lateral lower surety ulcer. Fortunately there's no signs of infection at this time which is great news. Unfortunately he still builds up a significant amount of Slough/biofilm of the surface of the wound he still is not really cleaning this as he should be appropriately. Again I'm able to easily with saline and gauze remove the majority of this on the surface which if you would do this at home would likely be a dramatic improvement for him as far as getting the area to improve. Nonetheless overall I still feel like he  is making progress is just very slow. I think Santyl will be of benefit for him as well. Still he has not gotten this as of this point. 07/27/18 on evaluation today patient actually appears to be doing little worse in regards of the erythema around the periwound region of the wound he also tells me that he's been having more drainage currently compared to what he was experiencing last time I saw him. He states not quite as bad as what he had because this was infected previously but nonetheless is still appears to be doing poorly. Fortunately there is no evidence of systemic infection at this point. The patient  tells me that he is not going to be able to afford the Santyl. He is still waiting to hear about the infusion therapy with his dermatologist. Apparently she wants an updated colonoscopy first. 08/10/18 on evaluation today patient appears to be doing better in regard to his left lateral lower extremity ulcer. Fortunately he is showing signs of improvement in this regard he's actually been approved for Remicade infusion's as well although this has not been scheduled as of yet. Fortunately there's no signs of active infection at this time in regard to the wound although he is having some issues with infection of the right lower extremity is been seen as dermatologist for this. Fortunately they are definitely still working with him trying to keep things under control. [name], [name] ([id number]) 09/07/18 on evaluation today patient is actually doing rather well in regard to his left lateral lower extremity ulcer. He notes these actually having some hair grow back on his extremity which is something he has not seen in years. He also tells me that the pain is really not giving them any trouble at this time which is also good news overall she is very pleased with the progress he's using a combination of the mupirocin along with the probate is all mixed. 09/21/18 on evaluation today patient actually appears to be doing fairly well all things considered in regard to his looks from the ulcer. He's been tolerating the dressing changes without complication. Fortunately there's no signs of active infection at this time which is good news he is still on all antibiotics or prevention of the staff infection. He has been on prednisone for time although he states it is gonna contact his dermatologist and see if she put them on a short course due to some irritation that he has going on currently. Fortunately there's no evidence of any overall worsening this is going very slow I think cental would be something that would be  helpful for him although he states that $50 for tube is quite expensive. He therefore is not willing to get that at this point. 10/06/18 on evaluation today patient actually appears to be doing decently well in regard to his left lateral leg ulcer. He's been tolerating the dressing changes without complication. Fortunately there's no signs of active infection at this time. Overall I'm actually rather pleased with the progress he's making although it's slow he doesn't show any signs of infection and he does seem to be making some improvement. I do believe that he may need a switch up and dressings to try to help this to heal more appropriately and quickly. 10/19/18 on evaluation today patient actually appears to be doing better in regard to his left lateral lower extremity ulcer. This is shown signs of having much less Slough buildup at this point due to the fact he has been using  the Santyl. Obviously this is very good news. The overall size of the wound is not dramatically smaller but again the appearance is. 11/02/18 on evaluation today patient actually appears to be doing quite well in regard to his lower [place] ulcer. A lot of the skin around the ulcer is actually somewhat irritating at this point this seems to be more due to the dressing causing irritation from the adhesive that anything else. Fortunately there is no signs of active infection at this time. 11/24/18 on evaluation today patient appears to be doing a little worse in regard to his overall appearance of his lower extremity ulcer. There's more erythema and warmth around the wound unfortunately. He is currently on doxycycline which he has been on for some time. With that being said I'm not sure that seems to be helping with what appears to possibly be an acute cellulitis with regard to his left lower extremity ulcer. No fevers, chills, nausea, or vomiting noted at this time. 12/08/18 on evaluation today patient's wounds actually appears to  be doing significantly better compared to his last evaluation. He has been using Santyl along with alternating tripling about appointment as well as the steroid cream seems to be doing quite well and the wound is showing signs of improvement which is excellent news. Fortunately there's no evidence of infection and in fact his culture came back negative with only normal skin flora noted. 12/21/2018 upon evaluation today patient actually appears to be doing excellent with regard to his ulcer. This is actually the best that I have seen it since have been helping to take care of him. It is both smaller as well as less slough noted on the surface of the wound and seems to be showing signs of good improvement with new skin growing from the edges. He has been using just the triamcinolone he does wonder if he can get a refill of that ointment today. 01/04/2019 upon evaluation today patient actually appears to be doing well with regard to his left lateral lower extremity ulcer. With that being said it does not appear to be that he is doing quite as well as last time as far as progression is concerned. There does not appear to be any signs of infection or significant irritation which is good news. With that being said I do believe that he may benefit from switching to a collagen based dressing based on how clean The wound appears. 01/18/2019 on evaluation today patient actually appears to be doing well with regard to his wound on the left lower extremity. He is not made a lot of progress compared to where we were previous but nonetheless does seem to be doing okay at this time which is good news. There is no signs of active infection which is also good news. My only concern currently is I do wish we can get him into utilizing the collagen dressing his insurance would not pay for the supplies that we ordered although it appears that he may be able to order this through his supply company that he typically utilizes.  This is [employer]. Nonetheless he did try to order it during the office visit today and it appears this did go through. We will see if he can get that it is a different brand but nonetheless he has collagen and I do think will be beneficial. 02/01/2019 on evaluation today patient actually appears to be doing a little worse today in regard to the overall size of his wounds. Fortunately there  is no signs of active infection at this time. That is visually. Nonetheless when this is happened before it was due to infection. For that reason were somewhat concerned about that this time as well. 02/08/2019 on evaluation today patient unfortunately appears to be doing slightly worse with regard to his wound upon evaluation today. Is measuring a little deeper and a little larger unfortunately. I am not really sure exactly what is causing this to enlarge he actually did see his dermatologist she is going to see about initiating Humira for him. Subsequently she also did do steroid injections into the wound itself in the periphery. Nonetheless still nonetheless he seems to be getting a little bit larger he is gone back to just using the steroid cream topically which I think is appropriate. I would say hold off on the collagen for the time being is definitely a good thing to do. Based on the culture results which we finally did get the final result back regarding it shows staph as the bacteria noted again that can be a normal skin bacteria based on the fact however he is having increased drainage and worsening of the wound measurement wise I would go ahead and place him on an antibiotic today I do believe for this. 02/15/2019 on evaluation today patient actually appears to be doing somewhat better in regard to his ulcer. There is no signs of worsening at this time I did review his culture results which showed evidence of Staphylococcus aureus but not MRSA. Again this could just be more related to the normal skin  bacteria although he states the drainage has slowed down quite a bit he may have had a mild infection not just colonization. And was much smaller and then since around10/04/2019 on evaluation today patient appears to be doing unfortunately worse as far as the size of the wound. I really feel like that this is steadily getting larger again it had been doing excellent right at the beginning of September we have seen a steady increase in the area of the wound it is almost 2-1/2 times the size it was on September 1. Obviously this is a bad trend this is not wanting to see. For that reason we went back to using just the topical triamcinolone cream which does seem to help with inflammation. I checked him for bacteria by way of culture and nothing showed positive there. I am considering giving him a short course of a tapering steroid Dosepak today to see if that is can be beneficial for him. The patient is in agreement with giving that a try. 03/08/2019 on evaluation today patient appears to be doing very well in comparison to last evaluation with regard to his lower extremity ulcer. This is showing signs of less inflammation and actually measuring slightly smaller compared to last time every other week over the past month and a half he has been measuring larger larger larger. Nonetheless I do believe that the issue has been inflammation the prednisone does seem to [facility], [place] ([id number]) have been beneficial for him which is good news. No fevers, chills, nausea, vomiting, or diarrhea. 03/22/2019 on evaluation today patient appears to be doing about the same with regard to his leg ulcer. He has been tolerating the dressing changes without complication. With that being said the wound seems to be mostly arrested at its current size but really is not making any progress except for when we prescribed the prednisone. He did show some signs of dropping as far as  the overall size of the wound during that interval  week. Nonetheless this is something he is not on long-term at this point and unfortunately I think he is getting need either this or else the Humira which his dermatologist has discussed try to get approval for. With that being said he will be seeing his dermatologist on the 11th of this month that is November. 04/19/2019 on evaluation today patient appears to be doing really about the same the wound is measuring slightly larger compared to last time I saw him. He has not been into the office since November 2 due to the fact that he unfortunately had Covid as that his entire family. He tells me that it was rough but they did pull-through and he seems to be doing much better. Fortunately there is no signs of active infection at this time. No fevers, chills, nausea, vomiting, or diarrhea. 05/10/2019 on evaluation today patient unfortunately appears to be doing significantly worse as compared to last time I saw him. He does tell me that he has had his first dose of Humira and actually is scheduled to get the next one in the upcoming week. With that being said he tells me also that in the past several days he has been having a lot of issues with green drainage she showed me a picture this is more blue-green in color. He is also been having issues with increased sloughy buildup and the wound does appear to be larger today. Obviously this is not the direction that we want everything to take based on the starting of his Humira. Nonetheless I think this is definitely a result of likely infection and to be honest I think this is probably Pseudomonas causing the infection based on what I am seeing. 05/24/2019 on evaluation today patient unfortunately appears to be doing significantly worse compared to his prior evaluation with me 2 weeks ago. I did review his culture results which showed that he does have Staph aureus as well as Pseudomonas noted on the culture. Nonetheless the Levaquin that I prescribed for him  does not appear to have been appropriate and in fact he tells me he is no longer experiencing the green drainage and discharge that he had at the last visit. Fortunately there is no signs of active infection at this time which is good news although the wound has significantly worsened it in fact is much deeper than it was previous. We have been utilizing up to this point triamcinolone ointment as the prescription topical of choice but at this time I really feel like that the wound is getting need to be packed in order to appropriately manage this due to the deeper nature of the wound. Therefore something along the lines of an alginate dressing may be more appropriate. 05/31/2019 upon inspection today patient's wound actually showed signs of doing poorly at this point. Unfortunately he just does not seem to be making any good progress despite what we have tried. He actually did go ahead and pick up the Cipro and start taking that as he was noticing more green drainage he had previously completed the Levaquin that I prescribed for him as well. Nonetheless he missed his appointment for the seventh last week on Wednesday with the wound care center and [facility] where his dermatologist referred him. Obviously I do think a second opinion would be helpful at this point especially in light of the fact that the patient seems to be doing so poorly despite the fact  that we have tried everything that I really know how at this point. The only thing that ever seems to have helped him in the past is when he was on high doses of continual steroids that did seem to make a difference for him. 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 06/07/2019 upon evaluation today patient unfortunately appears to be doing worse yet again with regard to his wound. In fact I  am starting to question whether or not he may have a fluid pocket in the muscle at this point based on the bulging and the soft appearance to the central portion of the muscle area. There is not anything draining from the muscle itself at this time which is good news but nonetheless the wound is expanding. I am not really seeing any results of the Humira as far as overall wound progression based on what I am seeing at this point. The patient has been referred for second opinion with regard to his wound to the [facility] wound care center by his dermatologist which I definitely am not in opposition to. Unfortunately we tried multiple dressings in the past including collagen, alginate, and at one point even Hydrofera Blue. With that being said he is never really used it for any significant amount of time due to the fact that he often complains of pain associated with these dressings and then will go back to either using the Santyl which she has done intermittently or more frequently the triamcinolone. He is also using his own compression stockings. We have wrapped him in the past but again that was something else that he really was not a big fan of. 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 He seemed to do much better when he was on the steroids although honestly I understand that the steroids are not necessarily the best medication to be on long-term obviously 06/14/2019 on evaluation today patient appears to be doing actually a little bit better with regard to the overall appearance with his leg. Unfortunately he does continue to have issues with what appears to be some fluid underneath  the muscle although he did see the wound specialty center at [facility] last week their main goals were to see about infusion therapy in place of the Humira as they feel like that is not quite strong enough. They also recommended that we continue with the treatment otherwise as we are they felt like that was appropriate and they are okay with him continuing to follow-up here with [place] in that regard. With that being said they are also sending him to the vein specialist there to see about vein stripping and if that would be of benefit for him. Subsequently they also did not really address whether or not an ultrasound of the muscle area to see if there is anything that needs to be addressed here would be appropriate or not. For that reason I discussed this with him last week I think we  may proceed down that road at this point. 06/21/2019 upon evaluation today patient's wound actually appears to be doing slightly better compared to previous evaluations. I do believe that he has made a difference with regard to the progression here with the use of oral steroids. Again in the past has been the only thing that is really calm things down. He does tell me that from [facility] is gotten a good news from there that there are no further vein stripping that is necessary at this point. I do not have that available for review today although the patient did relay this to me. He also did obtain and have the ultrasound of the wound completed which I did sign off on today. It does appear that there is no fluid collection under the muscle this is likely then just edematous tissue in general. That is also good news. Overall I still believe the inflammation is the main issue here. He did inquire about the possibility of a wound VAC again with the muscle protruding like it is I am not really sure whether the wound VAC is necessarily ideal or not. That is something we will have to consider although I do believe he may need compression  wrapping to try to help with edema control which could potentially be of benefit. 06/28/2019 on evaluation today patient appears to be doing slightly better measurement wise although this is not terribly smaller he least seems to be trending towards that direction. With that being said he still seems to have purulent drainage noted in the wound bed at this time. He has been on Levaquin followed by Cipro over the past month. Unfortunately he still seems to have some issues with active infection at this time. I did perform a culture last week in order to evaluate and see if indeed there was still anything going on. Subsequently the culture did come back showing Pseudomonas which is consistent with the drainage has been having which is blue-green in color. He also has had an odor that again was somewhat consistent with Pseudomonas as well. Long story short it appears that the culture showed an intermediate finding with regard to how well the Cipro will work for the Pseudomonas infection. Subsequently being that he does not seem to be clearing up and at best what we are doing is just keeping this at [place] I think he may need to see infectious disease to discuss IV antibiotic options. HEIDI, LEMAY ([id number]) 07/05/2019 upon evaluation today patient appears to be doing okay in regard to his leg ulcer. He has been tolerating the dressing changes at this point without complication. Fortunately there is no signs of active infection at this time which is good news. No fevers, chills, nausea, vomiting, or diarrhea. With that being said he does have an appointment with infectious disease tomorrow and his primary care on Wednesday. Again the reason for the infectious disease referral was due to the fact that he did not seem to be fully resolving with the use of oral antibiotics and therefore we were thinking that IV antibiotic therapy may be necessary secondary to the fact that there was an intermediate finding for  how effective the Cipro may be. Nonetheless again he has been having a lot of purulent and even green drainage. Fortunately right now that seems to have calmed down over the past week with the reinitiation of the oral antibiotic. Nonetheless we will see what Dr. Megan Salon has to say. 07/12/2019 upon evaluation today patient appears to be  doing about the same at this point in regard to his left lower extremity ulcer. Fortunately there is no signs of active infection at this time which is good news I do believe the Levaquin has been beneficial I did review Dr. Hale Bogus note and to be honest I agree that the patient's leg does appear to be doing better currently. What we found in the past as he does not seem to really completely resolve he will stop the antibiotic and then subsequently things will revert back to having issues with blue-green drainage, increased pain, and overall worsening in general. Obviously that is the reason I sent him back to infectious disease. 07/19/2019 upon evaluation today patient appears to be doing roughly the same in size there is really no dramatic improvement. He has started back on the Levaquin at this point and though he seems to be doing okay he did still have a lot of blue/green drainage noted on evaluation today unfortunately. I think that this is still indicative more likely of a Pseudomonas infection as previously noted and again he does see Dr. Megan Salon in just a couple of days. I do not know that were really able to effectively clear this with just oral antibiotics alone based on what I am seeing currently. Nonetheless we are still continue to try to manage as best we can with regard to the patient and his wound. I do think the wrap was helpful in decreasing the edema which is excellent news. No fevers, chills, nausea, vomiting, or diarrhea. 07/26/2019 upon evaluation today patient appears to be doing slightly better with regard to the overall appearance of the muscle  there is no dark discoloration centrally. Fortunately there is no signs of active infection at this time. No fevers, chills, nausea, vomiting, or diarrhea. Patient's wound bed currently the patient did have an appointment with Dr. Megan Salon at infectious disease last week. With that being said Dr. Megan Salon the patient states was still somewhat hesitant about put him on any IV antibiotics he wanted [place] to repeat cultures today and then see where things go going forward. He does look like Dr. Megan Salon because of some improvement the patient did have with the Levaquin wanted [place] to see about repeating cultures. If it indeed grows the Pseudomonas again then he recommended a possibility of considering a PICC line placement and IV antibiotic therapy. He plans to see the patient back in 1 to 2 weeks. 08/02/2019 upon evaluation today patient appears to be doing poorly with regard to his left lower extremity. We did get the results of his culture back it shows that he is still showing evidence of Pseudomonas which is consistent with the purulent/blue-green drainage that he has currently. Subsequently the culture also shows that he now is showing resistance to the oral fluoroquinolones which is unfortunate as that was really the only thing to treat the infection prior. I do believe that he is looking like this is going require IV antibiotic therapy to get this under control. Fortunately there is no signs of systemic infection at this time which is good news. The patient does see Dr. Megan Salon tomorrow. 08/09/2019 upon evaluation today patient appears to be doing better with regard to his left lower extremity ulcer in regard to the overall appearance. He is currently on IV antibiotic therapy. As ordered by Dr. Megan Salon. Currently the patient is on ceftazidime which she is going to take for the next 2 weeks and then follow-up for 4 to 5-week appointment with Dr. Megan Salon.  The patient started this this past Friday  symptoms have not for a total of 3 days currently in full. 08/16/2019 upon evaluation today patient's wound actually does show muscle in the base of the wound but in general does appear to be much better as far as the overall evidence of infection is concerned. In fact I feel like this is for the most part cleared up he still on the IV antibiotics he has not completed the full course yet but I think he is doing much better which is excellent news. 08/23/2019 upon evaluation today patient appears to be doing about the same with regard to his wound at this point. He tells me that he still has pain unfortunately. Fortunately there is no evidence of systemic infection at this time which is great news. There is significant muscle protrusion. 09/13/19 upon evaluation today patient appears to be doing about the same in regard to his leg unfortunately. He still has a lot of drainage coming from the ulceration there is still muscle exposed. With that being said the patient's last wound culture still showed an intermediate finding with regard to the Pseudomonas he still having the bluish/green drainage as well. Overall I do not know that the wound has completely cleared of infection at this point. Fortunately there is no signs of active infection systemically at this point which is good news. 09/20/2019 upon evaluation today patient's wound actually appears to be doing about the same based on what I am seeing currently. I do not see any signs of systemic infection he still does have evidence of some local infection and drainage. He did see Dr. Megan Salon last week and Dr. Megan Salon states that he probably does need a different IV antibiotic although he does not want to put him on this until the patient begins the Remicade infusion which is actually scheduled for about 10 days out from today on 13 May. Following that time Dr. Megan Salon is good to see him back and then will evaluate the feasibility of starting him on the  IV antibiotic therapy once again at that point. I do not disagree with this plan I do believe as Dr. Megan Salon stated in his note that I reviewed today that the patient's issue is multifactorial with the pyoderma being 1 aspect of this that were hoping the Remicade will be helpful for her. In the meantime I think the gentamicin is, helping to keep things under decent okay control in regard to the ulcer. 09/27/2019 upon evaluation today patient appears to be doing about the same with regard to his wound still there is a lot of muscle exposure though he does have some hyper granulation tissue noted around the edge and actually some granulation tissue starting to form over the muscle which is actually good news. Fortunately there is no evidence of active infection which is also good news. His pain is less at this point. 5/21; this is a patient I have not seen in a long time. 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 He has a large wound on the left lateral leg with protruding muscle. He comes in the clinic today showing the same area on his left medial ankle. He says there is been a spot there for some time although we have not previously defined this. Today he has a clearly defined area with slight amount of skin breakdown surrounded by raised areas with a purplish hue in color. This is not painful he says it is irritated.  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 He still has muscle protruding from the lateral portion of his left leg but fortunately the new area that they were concerned about at his last visit does not appear to have opened at this point. He is currently on Remicade infusions and seems to be doing better in my opinion in fact the wound itself seems to be overall much better. The purplish discoloration that he did have seems to have resolved  and I think that is a good sign that hopefully the Remicade is doing its job. He does have some biofilm noted over the surface of the wound. 11/01/2019 on evaluation today patient's wound actually appears to be doing excellent at this time. Fortunately there is no evidence of active infection and overall I feel like he is making great progress. The Remicade seems to be due excellent job in my opinion. [name], KONECNY ([id number]) 11/08/19 evaluation today vision actually appears to be doing quite well with regard to his weight ulcer. He's been tolerating dressing changes without complication. Fortunately there is no evidence of infection. No fevers, chills, nausea, or vomiting noted at this time. Overall states that is having more itching than pain which is actually a good sign in my opinion. 12/13/2019 upon evaluation today patient appears to be doing well today with regard to his wound. He has been tolerating the dressing changes without complication. Fortunately there is no sign of active infection at this time. No fevers, chills, nausea, vomiting, or diarrhea. Overall I feel like the infusion therapy has been very beneficial for him. 01/06/2020 on evaluation today patient appears to be doing well with regard to his wound. This is measuring smaller and actually looks to be doing better. Fortunately there is no signs of active infection at this point. No fevers, chills, nausea, vomiting, or diarrhea. With that being said he does still have the blue-green drainage but this does not seem to be causing any significant issues currently. He has been using the gentamicin that does seem to be keeping things under decent control at this point. He goes later this morning for his next infusion therapy for the pyoderma which seems to also be very beneficial. 02/07/2020 on evaluation today patient appears to be doing about the same in regard to his wounds currently. Fortunately there is no signs of active infection  systemically he does still have evidence of local infection still using gentamicin. He also is showing some signs of improvement albeit slowly I do feel like we are making some progress here. 02/21/2020 upon evaluation today patient appears to be making some signs of improvement the wound is measuring a little bit smaller which is great news and overall I am very pleased with where he stands currently. He is going to be having infusion therapy treatment on the 15th of this month. Fortunately there is no signs of active infection at this time. 03/13/2020 I do believe patient's wound is actually showing some signs of improvement here which is great news. He has continue with the infusion therapy through rheumatology/dermatology at [facility]. That does seem to be beneficial. I still think he gets as much benefit from this as he did from the prednisone initially but nonetheless obviously this is less harsh on his body that the prednisone as far as they are concerned. 03/31/2020 on evaluation today patient's wound actually showing signs of some pretty good improvement in regard  to the overall appearance of the wound bed. There is still muscle exposed though he does have some epithelial growth around the edges of the wound. Fortunately there is no signs of active infection at this time. No fevers, chills, nausea, vomiting, or diarrhea. 04/24/2020 upon evaluation today patient appears to be doing about the same in regard to his leg ulcer. He has been tolerating the dressing changes without complication. Fortunately there is no signs of active infection at this time. No fevers, chills, nausea, vomiting, or diarrhea. With that being said he still has a lot of irritation from the bandaging around the edges of the wound. We did discuss today the possibility of a referral to plastic surgery. 05/22/2020 on evaluation today patient appears to be doing well with regard to his wounds all things considered. He has not been able  to get the Chantix apparently there is a recall nurse that I was unaware of put out by [employer] involuntarily. Nonetheless for now I am and I have to do some research into what may be the best option for him to help with quitting in regard to smoking and we discussed that today. 06/26/2020 upon evaluation today patient appears to be doing well with regard to his wound from the standpoint of infection I do not see any signs of infection at this point. With that being said unfortunately he is still continuing to have issues with muscle exposure and again he is not having a whole lot of new skin growth unfortunately. There does not appear to be any signs of active infection at this time. No fevers, chills, nausea, vomiting, or diarrhea. 07/10/2020 upon evaluation today patient appears to be doing a little bit more poorly currently compared to where he was previous. I am concerned currently about an active infection that may be getting worse especially in light of the increased size and tenderness of the wound bed. No fevers, chills, nausea, vomiting, or diarrhea. 07/24/2020 upon evaluation today patient appears to be doing poorly in regard to his leg ulcer. He has been tolerating the dressing changes without complication but unfortunately is having a lot of discomfort. 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 Subsequently I think he is going require possibly IV antibiotics to get this under control. I am very concerned about the severity of his infection and the amount of discomfort he is having. 07/31/2020 upon evaluation today patient appears to be doing about the same in regard to his leg wound. He did see Dr. Megan Salon and Dr. Megan Salon is actually going to start him on IV antibiotics. He goes for the PICC line tomorrow. With that being said there do not have that run for 2 weeks and then see how things are doing and depending on how he  is progressing they may extend that a little longer. Nonetheless I am glad this is getting ready to be in place and definitely feel it may help the patient. In the meantime is been using mainly triamcinolone to the wound bed has an anti-inflammatory. 08/07/2020 on evaluation today patient appears to be doing well with regard to his wound compared even last week. In the interim he has gotten the PICC line placed and overall this seems to be doing excellent. There does not appear to be any evidence of infection which is great news systemically although locally of course has had the infection this appears to be improving with the use of the antibiotics. 08/14/2020 upon  evaluation today patient's wound actually showing signs of excellent improvement. Overall the irritation has significantly improved the drainage is back down to more of a normal level and his pain is really pretty much nonexistent compared to what it was. Obviously I think that this is significantly improved secondary to the IV antibiotic therapy which has made all the difference in the world. Again he had a resistant form of Pseudomonas for which oral antibiotics just was not cutting it. Nonetheless I do think that still we need to consider the possibility of a surgical closure for this wound is been open so long and to be honest with muscle exposed I think this can be very hard to get this to close outside of this although definitely were still working to try to do what we can in that regard. 08/21/2020 upon evaluation today patient appears to be doing very well with regard to his wounds on the left lateral lower extremity/calf area. Fortunately there does not appear to be signs of active infection which is great news and overall very pleased with where things stand today. He is actually wrapping up his treatment with IV antibiotics tomorrow. After that we will see where things go from there. 08/28/2020 upon evaluation today patient appears  to be doing decently well with regard to his leg ulcer. There does not appear to be any signs of active infection which is great news and overall very pleased with where things stand today. No fevers, chills, nausea, vomiting, or diarrhea. 09/18/2020 upon evaluation today patient appears to be doing well with regard to his infection which I feel like is better. Unfortunately he is not doing as well with regard to the overall size of the wound which is not nearly as good at this point. I feel like that he may be having an issue here with the pyoderma being somewhat out of control. I think that he may benefit from potentially going back and talking to the dermatologist about [name], [name] ([id number]) what to do from the pyoderma standpoint. I am not certain if the infusions are helping nearly as much is what the prednisone did in the past. 10/02/2020 upon evaluation today patient appears to be doing well with regard to his leg ulcer. He did go to the Psychiatric nurse. Unfortunately they feel like there is a 10% chance that most that he would be able to heal and that the skin graft would take. Obviously this has led him to not be able to go down that path as far as treatment is concerned. Nonetheless he does seem to be doing a little bit better with the prednisone that I gave him last time. I think that he may need to discuss with dermatology the possibility of long-term prednisone as that seems to be what is most helpful for him to be perfectly honest. I am not sure the Remicade is really doing the job. 10/17/2020 upon evaluation today patient appears to be doing a little better in regard to his wound. In fact the case has been since we did the prednisone on May 2 for him that we have noticed a little bit of improvement each time we have seen a size wise as well as appearance wise as well as pain wise. I think the prednisone has had a greater effect then the infusion therapy has to be perfectly honest. With  that being said the patient also feels significantly better compared to what he was previous. All of this is good news but  nonetheless I am still concerned about the fact that again we are really not set up to long-term manage him as far as prednisone is concerned. Obviously there are things that you need to be watched I completely understand the risk of prednisone usage as well. 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 With all that being said I do believe that we can give him another round of the prednisone which she is requesting today because of the improvement that he seen since we did that first round. 10/30/2020 upon evaluation today patient's wound actually is showing signs of doing quite well. There does not appear to be any evidence of infection which is great news and overall very pleased with where things stand today. No fevers, chills, nausea, vomiting, or diarrhea. He tells me that the prednisone still has seem to have helped he wonders if we can extend that for just a little bit longer. He did not have the appointment with a dermatologist although he did have an infusion appointment last Friday. That was at [facility]. With that being said he tells me he could not do both that as well as the appointment with the physician on the same day therefore that is can have to be rescheduled. I really want to see if there is anything they feel like that could be done differently to try to help this out as I am not really certain that the infusions are helping significantly here. 11/13/2020 upon evaluation today patient unfortunately appears to be doing somewhat poorly in regard to his wound I feel like this is actually worsening from the standpoint of the pyoderma spreading. I still feel like that he may need something different as far as trying to manage this going forward. Again we did the prednisone unfortunately his blood sugars are not doing so well  because of this. Nonetheless I believe that the patient likely needs to try topical steroid. We have done triamcinolone for a while I think going with something stronger such as clobetasol could be beneficial again this is not something I do lightly I discussed this with the patient that again this does not normally put underneath an occlusive dressing. Nonetheless I think a thin film as such could help with some of the stronger anti-inflammatory effects. We discussed this today. He would like to try to give this a trial for the next couple weeks. I definitely think that is something that we can do. Evaluate7/03/2021 and today patient's wound bed actually showed signs of doing really about the same. There was a little expansion of the size of the wound and that leading edge that we done looking out although the clobetasol does seem to have slowed this down a bit in my opinion. There is just 1 small area that still seems to be progressing based on what I see. Nonetheless I am concerned about the fact this does not seem to be improving if anything seems to be doing a little bit worse. I do not know that the infusions are really helping him much as next infusion is August 5 his appointment with dermatology is July 25. Either way I really think that we need to have a conversation potentially about this and I am actually going to see if I can talk with Dr. Lillia Carmel in order to see where things stand as well. 12/11/2020 upon evaluation today patient appears to be doing worse in regard to his leg ulcer. Unfortunately  I just do not think this is making the progress that I would like to see at this point. Honestly he does have an appointment with dermatology and this is in 2 days. I am wondering what they may have to offer to help with this. Right now what I am seeing is that he is continuing to show signs of worsening little by little. Obviously that is not great at all. Is the exact opposite of what we are  looking for. 12/18/2020 upon evaluation today patient appears to be doing a little better in regard to his wound. The dermatologist actually did do some steroid injections into the wound which does seem to have been beneficial in my opinion. That was on the 25th already this looks a little better to me than last time I saw him. With that being said we did do a culture and this did show that he has Staph aureus noted in abundance in the wound. With that being said I do think that getting him on an oral antibiotic would be appropriate as well. Also think we can compression wrap and this will make a difference as well. 12/28/2020 upon evaluation today patient's wound is actually showing signs of doing much better. I do believe the compression wrap is helping he has a lot of drainage but to be honest I think that the compression is helping to some degree in this regard as well as not draining through which is also good news. No fevers, chills, nausea, vomiting, or diarrhea. 01/04/2021 upon evaluation today patient appears to be doing well with regard to his wound. Overall things seem to be doing quite well. He did have a little bit of reaction to the CarboFlex Sorbact he will be using that any longer. With that being said he is controlled as far as the drainage is concerned overall and seems to be doing quite well. I do not see any signs of active infection at this time which is great news. No fevers, chills, nausea, vomiting, or diarrhea. 01/11/2021 upon evaluation today patient appears to be doing well with regard to his wounds. He has been tolerating the dressing changes without complication. Fortunately there does not appear to be any signs of active infection at this time which is great news. Overall I am extremely pleased with where we stand currently. No fevers, chills, nausea, vomiting, or diarrhea. Where using clobetasol in the wound bed he has a lot of new skin growth which is awesome as  well. 01/18/2021 upon evaluation today patient appears to be doing very well in regard to his leg ulcer. He has been tolerating the dressing changes without complication. Fortunately there does not appear to be any signs of active infection which is great news. In general I think that he is making excellent progress 01/25/2021 upon evaluation today patient appears to be doing well with regard to his wound on the leg. I am actually extremely pleased with where things stand today. There does not appear to be any signs of active infection which is great news and overall I think that we are definitely headed in the appropriate direction based on what I am seeing currently. There does not appear to be any signs of active infection also excellent news. 02/06/2021 upon evaluation today patient appears to be doing well with regard to his wound. Overall visually this is showing signs of significant improvement which is great news. I do not see any signs of active infection systemically which is great even locally  I do not think that we are seeing any major complications here. We did do fluorescence imaging with the [employer] today. The patient does have some odor and drainage noted and again this is something that I think would benefit him to probably come more frequently for nurse visits. 02/19/2021 upon evaluation today patient actually appears to be doing quite well in regard to his wound. He has been tolerating the dressing Boback, Kalai ([id number]) changes without complication and overall I think that this is making excellent progress. I do not see any evidence of active infection at this point which is great news as well. No fevers, chills, nausea, vomiting, or diarrhea. 10/10; wound is made nice progress healthy granulation with a nice rim of epithelialization which seems to be expanding even from last week he has a deeper area in the inferior part of the more distal part of the wound with not quite as  healthy as surface. This area will need to be followed. Using clobetasol and Hydrofera Blue 03/05/2021 upon evaluation today patient appears to be doing very well in regard to his leg ulcer. He has been tolerating dressing changes without complication. Fortunately there does not appear to be any signs of active infection which is great news and overall I am extremely pleased with where we stand currently. Electronic Signature(s) Signed: 03/05/2021 9:14:07 AM By: Worthy Keeler PA-C Entered By: Worthy Keeler on 03/05/2021 09:14:07 KOUPER, SPINELLA ([id number]) -------------------------------------------------------------------------------- Physical Exam Details Patient Name: [name] Date of Service: 03/05/2021 8:15 AM Medical Record Number: [id number] Patient Account Number: [id number] Date of Birth/Sex: [birth year] (40 y.o. M) Treating RN: Dolan Amen Primary Care Provider: [name] Other Clinician: Referring Provider: [name] Treating Provider/Extender: [name] Cliche in Treatment: 34 Constitutional Well-nourished and well-hydrated in no acute distress. Respiratory normal breathing without difficulty. Psychiatric this patient is able to make decisions and demonstrates good insight into disease process. Alert and Oriented x 3. pleasant and cooperative. Notes Upon inspection patient's wound bed showed signs of good granulation and epithelization at this point. Fortunately there does not appear to be any evidence of infection and I think he is making great progress with regard to the overall wound healing I think that were still on track to accomplish getting this completely closed hopefully in the near future. Electronic Signature(s) Signed: 03/05/2021 9:14:39 AM By: Worthy Keeler PA-C Entered By: Worthy Keeler on 03/05/2021 09:14:38 [name] ([id number]) -------------------------------------------------------------------------------- Physician  Orders Details Patient Name: [name] Date of Service: 03/05/2021 8:15 AM Medical Record Number: [id number] Patient Account Number: [id number] Date of Birth/Sex: 22-Jun-1980 (40 y.o. 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 o Nurse Visit as needed - twice a week Bathing/ Shower/ Hygiene o Clean wound with Normal Saline or wound cleanser. o Other: - Apply dakins soaked gauze on wound after cleansing wound IN OFFICE ONLY Edema Control - Lymphedema / Segmental Compressive Device / Other o Optional: One layer of unna paste to top of compression wrap (to act as an anchor). o Elevate,  Exercise Daily and Avoid Standing for Long Periods of Time. o Elevate legs to the level of the heart and pump ankles as often as possible o Elevate leg(s) parallel to the floor when sitting. Wound Treatment Wound #1 - Lower Leg Wound Laterality: Left, Lateral Cleanser: Wound Cleanser 3 x Per Week/30 Days Discharge Instructions: Wash your hands with soap and water. Remove old dressing, discard into plastic bag and place into trash. Cleanse the wound with Wound Cleanser prior to applying a clean dressing using gauze sponges, not tissues or cotton balls. Do not scrub or use excessive force. Pat dry using gauze sponges, not tissue or cotton balls. Peri-Wound Care: Moisturizing Lotion 3  x Per Week/30 Days Discharge Instructions: Suggestions: Theraderm, Eucerin, Cetaphil, or patient preference. Topical: Clobetasol Propionate ointment 0.05%, 60 (g) tube 3 x Per Week/30 Days Discharge Instructions: Apply to 7 o'clock depth Primary Dressing: Hydrofera Blue Ready Transfer Foam, 4x5 (in/in) 3 x Per Week/30 Days Discharge Instructions: Apply Hydrofera Blue Ready to wound bed as directed Secondary Dressing: Xtrasorb Medium 4x5 (in/in) 3 x Per Week/30 Days Discharge Instructions: Apply to wound as directed. Do not cut. Compression Wrap: Medichoice 4 layer Compression System, 35-40 mmHG (Generic) 3 x Per Week/30 Days Discharge Instructions: Apply multi-layer wrap as directed. Electronic Signature(s) Signed: 03/05/2021 12:01:13 PM By: Dolan Amen RN Signed: 03/05/2021 5:23:05 PM By: Worthy Keeler PA-C Entered By: Dolan Amen on 03/05/2021 08:39:18 LONIE, [place]SHAM ([id number]) -------------------------------------------------------------------------------- Problem List Details Patient Name: [name] Date of Service: 03/05/2021 8:15 AM Medical Record Number: [id number] Patient Account Number: [id number] Date of Birth/Sex: [birth year] (40 y.o. M) Treating RN: Dolan Amen Primary Care Provider: [name] Other Clinician: Referring Provider: [name] Treating Provider/Extender: [name] Cliche in Treatment: 221 Active Problems ICD-10 Encounter Code Description Active Date MDM Diagnosis L97.222 Non-pressure chronic ulcer of left calf with fat layer exposed 12/04/2016 No Yes L88 Pyoderma gangrenosum 03/26/2017 No Yes L97.321 Non-pressure chronic ulcer of left ankle limited to breakdown of skin 10/08/2019 No Yes I87.2 Venous insufficiency (chronic) (peripheral) 12/04/2016 No Yes L03.116 Cellulitis of left lower limb 05/24/2019 No Yes F17.208 Nicotine dependence, unspecified, with other nicotine-induced disorders 04/24/2020 No Yes Inactive Problems ICD-10 Code  Description Active Date Inactive Date L97.213 Non-pressure chronic ulcer of right calf with necrosis of muscle 04/02/2017 04/02/2017 Resolved Problems Electronic Signature(s) Signed: 03/05/2021 5:23:05 PM By: Worthy Keeler PA-C Entered By: Worthy Keeler on 03/05/2021 08:20:52 [name] ([id number]) -------------------------------------------------------------------------------- Progress Note Details Patient Name: [name] Date of Service: 03/05/2021 8:15 AM Medical Record Number: [id number] Patient Account Number: [id number] Date of Birth/Sex: 06-20-1980 (40 y.o. M) Treating RN: Dolan Amen Primary Care Provider: [name] Other Clinician: Referring Provider: [name] Treating Provider/Extender: [name] Cliche in Treatment: 221 Subjective Chief Complaint Information obtained from Patient He is here in follow up evaluation for LLE pyoderma ulcer History of Present Illness (HPI) 12/04/16; 40 year old man who comes into the clinic today for review of a wound on the posterior left calf. He tells me that is been there for about a year. He is not a diabetic he does smoke half a pack per day. He was seen in the ER on 11/20/16 felt to have cellulitis around the wound and was given clindamycin. An x-ray did not show osteomyelitis. The patient initially tells me that he has a milk allergy that sets off a pruritic itching rash on his lower legs which she scratches incessantly and he thinks that's what may have set up the wound. He  has been using various topical antibiotics and ointments without any effect. He works in a [employer] and is on his feet all day. He does not have a prior history of wounds however he does have the rash on both lower legs the right arm and the ventral aspect of his left arm. These are excoriations and clearly have had scratching however there are of macular looking areas on both legs including a substantial larger area on the right leg. This  does not have an underlying open area. There is no blistering. The patient tells me that 2 years ago in [place] in response to the rash on his legs he saw a dermatologist who told him he had a condition which may be pyoderma gangrenosum although I may be putting words into his mouth. He seemed to recognize this. On further questioning he admits to a 5 year history of quiesced. ulcerative colitis. He is not in any treatment for this. He's had no recent travel 12/11/16; the patient arrives today with his wound and roughly the same condition we've been using silver alginate this is a deep punched out wound with some surrounding erythema but no tenderness. Biopsy I did did not show confirmed pyoderma gangrenosum suggested nonspecific inflammation and vasculitis but does not provide an actual description of what was seen by the pathologist. I'm really not able to understand this We have also received information from the patient's dermatologist in [place] notes from April 2016. This was a doctor Agarwal-antal. 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 He was prescribed topical steroid high potency under occlusion which helped but at this point the patient did not have a deep punched out wound. 12/18/16; the patient's wound is larger in terms of surface area however this surface looks better and there is less depth. The surrounding erythema also is better. The patient states that the wrap we put on came off 2 days ago when he has been using his compression stockings. He we are in the process of getting a dermatology consult. 12/26/16 on evaluation today patient's left lower extremity wound shows evidence of infection with surrounding erythema noted. He has been tolerating the dressing changes but states that he has noted more discomfort. There is a larger area of erythema surrounding the wound. No fevers, chills, nausea, or vomiting noted at this time. With that being said the wound still does have  slough covering the surface. He is not allergic to any medication that he is aware of at this point. In regard to his right lower extremity he had several regions that are erythematous and pruritic he wonders if there's anything we can do to help that. 01/02/17 I reviewed patient's wound culture which was obtained his visit last week. He was placed on doxycycline at that point. Unfortunately that does not appear to be an antibiotic that would likely help with the situation however the pseudomonas noted on culture is sensitive to Cipro. Also unfortunately patient's wound seems to have a large compared to last week's evaluation. Not severely so but there are definitely increased measurements in general. He is continuing to have discomfort as well he writes this to be a seven out of 10. In fact he would prefer me not to perform any debridement today due to the fact that he is having discomfort and considering he has an active infection on the little reluctant to do so anyway. No fevers, chills, nausea, or vomiting noted at this time. 01/08/17; patient seems dermatology on September  5. I suspect dermatology will want the slides from the biopsy I did sent to their pathologist. I'm not sure if there is a way we can expedite that. In any case the culture I did before I left on vacation 3 weeks ago showed Pseudomonas he was given 10 days of Cipro and per her description of her intake nurses is actually somewhat better this week although the wound is quite a bit bigger than I remember the last time I saw this. He still has 3 more days of Cipro 01/21/17; dermatology appointment tomorrow. He has completed the ciprofloxacin for Pseudomonas. Surface of the wound looks better however he is had some deterioration in the lesions on his right leg. Meantime the left lateral leg wound we will continue with sample 01/29/17; patient had his dermatology appointment but I can't yet see that note. He is completed his antibiotics.  The wound is more superficial but considerably larger in circumferential area than when he came in. This is in his left lateral calf. He also has swollen erythematous areas with superficial wounds on the right leg and small papular areas on both arms. There apparently areas in her his upper thighs and buttocks I did not look at those. Dermatology biopsied the right leg. Hopefully will have their input next week. 02/05/17; patient went back to see his dermatologist who told him that he had a "scratching problem" as well as staph. He is now on a 30 day course of doxycycline and I believe she gave him triamcinolone cream to the right leg areas to help with the itching [not exactly sure but probably triamcinolone]. She apparently looked at the left lateral leg wound although this was not rebiopsied and I think felt to be ultimately part of the same pathogenesis. He is using sample border foam and changing nevus himself. He now has a new open area on the right posterior leg which was his biopsy site I don't have any of the dermatology notes 02/12/17; we put the patient in compression last week with SANTYL to the wound on the left leg and the biopsy. Edema is much better and the depth of the wound is now at level of skin. Area is still the same Biopsy site on the right lateral leg we've also been using santyl with a border foam dressing and he is changing this himself. 02/19/17; Using silver alginate started last week to both the substantial left leg wound and the biopsy site on the right wound. He is tolerating compression well. Has a an appointment with his primary M.D. tomorrow wondering about diuretics although I'm wondering if the edema problem is actually lymphedema [name], [name] ([id number]) 02/26/17; the patient has been to see his primary doctor Dr. Jerrel Ivory at [facility] our primary care. She started him on Lasix 20 mg and this seems to have helped with the edema. However we are not making  substantial change with the left lateral calf wound and inflammation. The biopsy site on the right leg also looks stable but not really all that different. 03/12/17; the patient has been to see vein and vascular Dr. Lucky Cowboy. He has had venous reflux studies I have not reviewed these. I did get a call from his dermatology office. They felt that he might have pathergy based on their biopsy on his right leg which led them to look at the slides of the biopsy I did on the left leg and they wonder whether this represents pyoderma gangrenosum which was the original supposition in  a man with ulcerative colitis albeit inactive for many years. They therefore recommended clobetasol and tetracycline i.e. 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 03/26/17; apparently the patient just had reflux studies not an appointment with Dr. dew. She arrives in clinic today having applied clobetasol for 2-3 weeks. He notes over the last 2-3 days excessive drainage having to change the dressing 3-4 times a day and also expanding erythema. 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 Lillia Carmel at [facility] dermatology locally who kindly saw him at my request. 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 My concern was the degree of erythema medially extending from his left leg wound which was either inflammation from pyoderma or cellulitis. I put him on Augmentin however culture of the wound showed Pseudomonas which is quinolone sensitive. I really don't believe he has cellulitis however in view of everything I will continue and give him a course of Cipro. He is also on doxycycline as an immune modulator for the  pyoderma. In addition to his original wound on the left lateral leg with surrounding erythema he has a wound on the right posterior calf which was an original biopsy site done by dermatology. 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 Saw Dr. Lillia Carmel yesterday. He has been using topical antibiotics to both wound areas his original wound on the left and the biopsies/pathergy area on the right. There is definitely some improvement in the inflammation around the wound on the right although the patient states he has increasing sensitivity of the wounds. He is on prednisone 60 and doxycycline 1 as prescribed by Dr. Lillia Carmel. He is covering the topical antibiotic with gauze and putting this in his own compression stocks and changing this daily. He states that Dr. Lottie Rater did a culture of the left leg wound yesterday 05/07/17; pyoderma gangrenosum. The patient saw Dr. Lillia Carmel yesterday and has a follow-up with her in one month. He is still using topical antibiotics to both wounds although he can't recall exactly what type. He is still on prednisone 60 mg. Dr. Lillia Carmel stated that the doxycycline could stop if we were in agreement. 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 The right medial leg was induced by biopsy/pathergy. The area on the right is essentially healed. 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 The right medial leg has closed and remains closed. He is still on prednisone 60. He tells me he missed his last dermatology appointment with Dr. Lillia Carmel but will make another appointment. He reports that her blood sugar at a recent screen in [place] was high 200's. He was 180 today. 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 I think  he is going to require still much longer prednisone perhaps another 3 months before attempting to taper. In the meantime his wound is a lot better. Smaller. He is cleaning this off daily and applying topical antibiotics. When he was last in the clinic I thought about changing to [facility] and actually put in a couple of  calls to dermatology although probably not during their business hours. 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 He continues on oral prednisone. He has been using triple antibiotic ointment. There is surface debris and we will transition to [facility] and have him return in 2 weeks. He has lost 30 pounds since his last appointment with lifestyle modification. He may benefit from topical steroid cream for treatment this can be considered at a later date. 08/22/17 on evaluation today patient appears to actually be doing rather well in regard to his left lateral lower extremity ulcer. He has actually been managed by Dr. Dellia Nims most recently. Patient is currently on oral steroids at this time. This seems to have been of benefit for him. Nonetheless his last visit was actually with Leah on 08/06/17. Currently he is not utilizing any topical steroid creams although this could be of benefit as well. No fevers, chills, nausea, or vomiting noted at this time. 09/05/17 on evaluation today patient appears to be doing better in regard to his left lateral lower extremity ulcer. He has been tolerating the dressing changes without complication. He is using Santyl with good effect. Overall I'm very pleased with how things are standing at this point. Patient likewise is happy that this is doing better. 09/19/17 on evaluation today patient actually appears to be doing rather well in regard to his left lateral lower extremity ulcer. Again this is secondary to Pyoderma gangrenosum and  he seems to be progressing well with the Santyl which is good news. He's not having any significant pain. 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 He has been tolerating the Santyl without complication and in general I feel like he's making good progress. 10/17/17 on evaluation today patient appears to be doing very well in regard to his left lateral lower surety ulcer. He has been tolerating the dressing changes without complication. There does not appear to be any evidence of infection he's alternating the Santyl and the triple antibiotic ointment every other day this seems to be doing well for him. 11/03/17 on evaluation today patient appears to be doing very well in regard to his left lateral lower extremity ulcer. He is been tolerating the dressing changes without complication which is good news. Fortunately there does not appear to be any evidence of infection which is also great news. Overall is doing excellent they are starting to taper down on the prednisone is down to 40 mg at this point it also started topical clobetasol for him. 11/17/17 on evaluation today patient appears to be doing well in regard to his left lateral lower surety ulcer. He's been tolerating the dressing changes without complication. He does note that he is having no pain, no excessive drainage or discharge, and overall he feels like things are going about how he would expect and hope they would. Overall he seems to have no evidence of infection at this time in my opinion which is good news. 12/04/17-He is seen in follow-up evaluation for right lateral lower extremity ulcer. He has been applying topical steroid cream. Today's measurement show slight increase in size. Over the next 2 weeks we will transition to every other day Santyl and steroid cream. He has been encouraged to monitor for changes and notify clinic  with any concerns 12/15/17 on evaluation today patient's left lateral motion the ulcer and fortunately is doing worse again at this point. This just since last week to this week has close to doubled in size according to the patient. I did not seeing last week's I do not have a visual to compare this to in our system was also down so we do not have all the charts and at this point. Nonetheless it does have me somewhat concerned in regard to the fact that again he was worried enough about it he has contact the dermatology that placed them back on the full strength, 50 mg a day of the prednisone that he was taken previous. He continues to alternate using clobetasol along with Santyl at this point. He is obviously somewhat frustrated. [name], [name] ([id number]) 12/22/17 on evaluation today patient appears to be doing a little worse compared to last evaluation. Unfortunately the wound is a little deeper and slightly larger than the last week's evaluation. With that being said he has made some progress in regard to the irritation surrounding at this time unfortunately despite that progress that's been made he still has a significant issue going on here. 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 For that reason I discussed with him today the possibility of perform a wound culture to ensure there's nothing overtly infected. 01/06/18 on evaluation today patient's wound is larger and deeper than previously evaluated. With that being said it did appear that his wound was infected after my last evaluation with him. Subsequently I did end up prescribing a prescription for Bactrim DS which she has been taking and having no complication with. Fortunately there does not appear to be any evidence of infection at this point in time as far as anything spreading, no want to  touch, and overall I feel like things are showing signs of improvement. 01/13/18 on evaluation today patient appears to be even a little larger and deeper than last time. There still muscle exposed in the base of the wound. Nonetheless he does appear to be less erythematous I do believe inflammation is calming down also believe the infection looks like it's probably resolved at this time based on what I'm seeing. No fevers, chills, nausea, or vomiting noted at this time. 01/30/18 on evaluation today patient actually appears to visually look better for the most part. Unfortunately those visually this looks better he does seem to potentially have what may be an abscess in the muscle that has been noted in the central portion of the wound. This is the first time that I have noted what appears to be fluctuance in the central portion of the muscle. With that being said I'm somewhat more concerned about the fact that this might indicate an abscess formation at this location. I do believe that an ultrasound would be appropriate. This is likely something we need to try to do as soon as possible. He has been switch to mupirocin ointment and he is no longer using the steroid ointment as prescribed by dermatology he sees them again next week he's been decreased from 60 to 40 mg of prednisone. 03/09/18 on evaluation today patient actually appears to be doing a little better compared to last time I saw him. There's not as much erythema surrounding the wound itself. He I did review his most recent infectious disease note which  was dated 02/24/18. He saw Dr. Michel Bickers in [place]. 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 Patient is now off of antibiotics and they are continually observing this. There seems to be no change in the past two weeks in my pinion based on what the patient says and what I see today compared to what  Dr. Megan Salon likely saw two weeks ago. No fevers, chills, nausea, or vomiting noted at this time. 03/23/18 on evaluation today patient's wound actually appears to be showing signs of improvement which is good news. He is currently still on the Dapsone. He is also working on tapering the prednisone to get off of this and Dr. Lottie Rater is working with him in this regard. Nonetheless overall I feel like the wound is doing well it does appear based on the infectious disease note that I reviewed from Dr. 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 I did also review the results of his ultrasound of left lower extremity which revealed there was a dentist tissue in the base of the wound without an abscess noted. 04/06/18 on evaluation today the patient's left lateral lower extremity ulcer actually appears to be doing fairly well which is excellent news. There does not appear to be any evidence of infection at this time which is also great news. Overall he still does have a significantly large ulceration although little by little he seems to be making progress. He is down to 10 mg a day of the prednisone. 04/20/18 on evaluation today patient actually appears to be doing excellent at this time in regard to his left lower extremity ulcer. He's making signs of good progress unfortunately this is taking much longer than we would really like to see but nonetheless he is making progress. Fortunately there does not appear to be any evidence of infection at this time. No fevers, chills, nausea, or vomiting noted at this time. The patient has not been using the Santyl due to the cost he hadn't got in this field yet. He's mainly been using the antibiotic ointment topically. Subsequently he also tells me that he really has not been scrubbing in the shower I think this would be helpful again as I told him it  doesn't have to be anything too aggressive to even make it believe just enough to keep it free of some of the loose slough and biofilm on the wound surface. 05/11/18 on evaluation today patient's wound appears to be making slow but sure progress in regard to the left lateral lower extremity ulcer. He is been tolerating the dressing changes without complication. Fortunately there does not appear to be any evidence of infection at this time. He is still just using triple antibiotic ointment along with clobetasol occasionally over the area. He never got the Santyl and really does not seem to intend to in my pinion. 06/01/18 on evaluation today patient appears to be doing a little better in regard to his left lateral lower extremity ulcer. He states that overall he does not feel like he is doing as well with the Dapsone as he did with the prednisone. Nonetheless he sees his dermatologist later today and is gonna talk to them about the possibility of going back on the prednisone. Overall again I believe that the wound would  be better if you would utilize [place] but he really does not seem to be interested in going back to the [place] at this point. He has been using triple antibiotic ointment. 06/15/18 on evaluation today patient's wound actually appears to be doing about the same at this point. Fortunately there is no signs of infection at this time. He has made slight improvements although he continues to not really want to clean the wound bed at this point. He states that he just doesn't mess with it he doesn't want to cause any problems with everything else he has going on. He has been on medication, antibiotics as prescribed by his dermatologist, for a staff infection of his lower extremities which is really drying out now and looking much better he tells me. Fortunately there is no sign of overall infection. 06/29/18 on evaluation today patient appears to be doing well in regard to his left lateral lower  surety ulcer all things considering. Fortunately his staff infection seems to be greatly improved compared to previous. He has no signs of infection and this is drying up quite nicely. He is still the doxycycline for this is no longer on cental, Dapsone, or any of the other medications. His dermatologist has recommended possibility of an infusion but right now he does not want to proceed with that. 07/13/18 on evaluation today patient appears to be doing about the same in regard to his left lateral lower surety ulcer. Fortunately there's no signs of infection at this time which is great news. Unfortunately he still builds up a significant amount of Slough/biofilm of the surface of the wound he still is not really cleaning this as he should be appropriately. Again I'm able to easily with saline and gauze remove the majority of this on the surface which if you would do this at home would likely be a dramatic improvement for him as far as getting the area to improve. Nonetheless overall I still feel like he is making progress is just very slow. I think Santyl will be of benefit for him as well. Still he has not gotten this as of this point. 07/27/18 on evaluation today patient actually appears to be doing little worse in regards of the erythema around the periwound region of the wound he also tells me that he's been having more drainage currently compared to what he was experiencing last time I saw him. He states not quite as bad as what he had because this was infected previously but nonetheless is still appears to be doing poorly. Fortunately there is no evidence of systemic infection at this point. The patient tells me that he is not going to be able to afford the Santyl. He is still waiting to hear about the infusion therapy with his dermatologist. Apparently she wants an updated colonoscopy first. [name], [name] ([id number]) 08/10/18 on evaluation today patient appears to be doing better in regard to his  left lateral lower extremity ulcer. Fortunately he is showing signs of improvement in this regard he's actually been approved for Remicade infusion's as well although this has not been scheduled as of yet. Fortunately there's no signs of active infection at this time in regard to the wound although he is having some issues with infection of the right lower extremity is been seen as dermatologist for this. Fortunately they are definitely still working with him trying to keep things under control. 09/07/18 on evaluation today patient is actually doing rather well in regard to his left lateral lower  extremity ulcer. He notes these actually having some hair grow back on his extremity which is something he has not seen in years. He also tells me that the pain is really not giving them any trouble at this time which is also good news overall she is very pleased with the progress he's using a combination of the mupirocin along with the probate is all mixed. 09/21/18 on evaluation today patient actually appears to be doing fairly well all things considered in regard to his looks from the ulcer. He's been tolerating the dressing changes without complication. Fortunately there's no signs of active infection at this time which is good news he is still on all antibiotics or prevention of the staff infection. He has been on prednisone for time although he states it is gonna contact his dermatologist and see if she put them on a short course due to some irritation that he has going on currently. Fortunately there's no evidence of any overall worsening this is going very slow I think cental would be something that would be helpful for him although he states that $50 for tube is quite expensive. He therefore is not willing to get that at this point. 10/06/18 on evaluation today patient actually appears to be doing decently well in regard to his left lateral leg ulcer. He's been tolerating the dressing changes without  complication. Fortunately there's no signs of active infection at this time. Overall I'm actually rather pleased with the progress he's making although it's slow he doesn't show any signs of infection and he does seem to be making some improvement. I do believe that he may need a switch up and dressings to try to help this to heal more appropriately and quickly. 10/19/18 on evaluation today patient actually appears to be doing better in regard to his left lateral lower extremity ulcer. This is shown signs of having much less Slough buildup at this point due to the fact he has been using the [employer]. Obviously this is very good news. The overall size of the wound is not dramatically smaller but again the appearance is. 11/02/18 on evaluation today patient actually appears to be doing quite well in regard to his lower [place] ulcer. A lot of the skin around the ulcer is actually somewhat irritating at this point this seems to be more due to the dressing causing irritation from the adhesive that anything else. Fortunately there is no signs of active infection at this time. 11/24/18 on evaluation today patient appears to be doing a little worse in regard to his overall appearance of his lower extremity ulcer. There's more erythema and warmth around the wound unfortunately. He is currently on doxycycline which he has been on for some time. With that being said I'm not sure that seems to be helping with what appears to possibly be an acute cellulitis with regard to his left lower extremity ulcer. No fevers, chills, nausea, or vomiting noted at this time. 12/08/18 on evaluation today patient's wounds actually appears to be doing significantly better compared to his last evaluation. He has been using Santyl along with alternating tripling about appointment as well as the steroid cream seems to be doing quite well and the wound is showing signs of improvement which is excellent news. Fortunately there's no evidence  of infection and in fact his culture came back negative with only normal skin flora noted. 12/21/2018 upon evaluation today patient actually appears to be doing excellent with regard to his ulcer. This is actually  the best that I have seen it since have been helping to take care of him. It is both smaller as well as less slough noted on the surface of the wound and seems to be showing signs of good improvement with new skin growing from the edges. He has been using just the triamcinolone he does wonder if he can get a refill of that ointment today. 01/04/2019 upon evaluation today patient actually appears to be doing well with regard to his left lateral lower extremity ulcer. With that being said it does not appear to be that he is doing quite as well as last time as far as progression is concerned. There does not appear to be any signs of infection or significant irritation which is good news. With that being said I do believe that he may benefit from switching to a collagen based dressing based on how clean The wound appears. 01/18/2019 on evaluation today patient actually appears to be doing well with regard to his wound on the left lower extremity. He is not made a lot of progress compared to where we were previous but nonetheless does seem to be doing okay at this time which is good news. There is no signs of active infection which is also good news. My only concern currently is I do wish we can get him into utilizing the collagen dressing his insurance would not pay for the supplies that we ordered although it appears that he may be able to order this through his supply company that he typically utilizes. This is [employer]. Nonetheless he did try to order it during the office visit today and it appears this did go through. We will see if he can get that it is a different brand but nonetheless he has collagen and I do think will be beneficial. 02/01/2019 on evaluation today patient actually appears to  be doing a little worse today in regard to the overall size of his wounds. Fortunately there is no signs of active infection at this time. That is visually. Nonetheless when this is happened before it was due to infection. For that reason were somewhat concerned about that this time as well. 02/08/2019 on evaluation today patient unfortunately appears to be doing slightly worse with regard to his wound upon evaluation today. Is measuring a little deeper and a little larger unfortunately. I am not really sure exactly what is causing this to enlarge he actually did see his dermatologist she is going to see about initiating Humira for him. Subsequently she also did do steroid injections into the wound itself in the periphery. Nonetheless still nonetheless he seems to be getting a little bit larger he is gone back to just using the steroid cream topically which I think is appropriate. I would say hold off on the collagen for the time being is definitely a good thing to do. Based on the culture results which we finally did get the final result back regarding it shows staph as the bacteria noted again that can be a normal skin bacteria based on the fact however he is having increased drainage and worsening of the wound measurement wise I would go ahead and place him on an antibiotic today I do believe for this. 02/15/2019 on evaluation today patient actually appears to be doing somewhat better in regard to his ulcer. There is no signs of worsening at this time I did review his culture results which showed evidence of Staphylococcus aureus but not MRSA. Again this could just  be more related to the normal skin bacteria although he states the drainage has slowed down quite a bit he may have had a mild infection not just colonization. And was much smaller and then since around10/04/2019 on evaluation today patient appears to be doing unfortunately worse as far as the size of the wound. I really feel like that  this is steadily getting larger again it had been doing excellent right at the beginning of September we have seen a steady increase in the area of the wound it is almost 2-1/2 times the size it was on September 1. Obviously this is a bad trend this is not wanting to see. For that reason we went back to using just the topical triamcinolone cream which does seem to help with inflammation. I checked him for bacteria by way of culture and nothing showed positive there. I am considering giving him a short course of a tapering steroid Yochanan Eddleman, Jaeshawn ([id number]) today to see if that is can be beneficial for him. The patient is in agreement with giving that a try. 03/08/2019 on evaluation today patient appears to be doing very well in comparison to last evaluation with regard to his lower extremity ulcer. This is showing signs of less inflammation and actually measuring slightly smaller compared to last time every other week over the past month and a half he has been measuring larger larger larger. Nonetheless I do believe that the issue has been inflammation the prednisone does seem to have been beneficial for him which is good news. No fevers, chills, nausea, vomiting, or diarrhea. 03/22/2019 on evaluation today patient appears to be doing about the same with regard to his leg ulcer. He has been tolerating the dressing changes without complication. With that being said the wound seems to be mostly arrested at its current size but really is not making any progress except for when we prescribed the prednisone. He did show some signs of dropping as far as the overall size of the wound during that interval week. Nonetheless this is something he is not on long-term at this point and unfortunately I think he is getting need either this or else the Humira which his dermatologist has discussed try to get approval for. With that being said he will be seeing his dermatologist on the 11th of this month that is  November. 04/19/2019 on evaluation today patient appears to be doing really about the same the wound is measuring slightly larger compared to last time I saw him. He has not been into the office since November 2 due to the fact that he unfortunately had Covid as that his entire family. He tells me that it was rough but they did pull-through and he seems to be doing much better. Fortunately there is no signs of active infection at this time. No fevers, chills, nausea, vomiting, or diarrhea. 05/10/2019 on evaluation today patient unfortunately appears to be doing significantly worse as compared to last time I saw him. He does tell me that he has had his first dose of Humira and actually is scheduled to get the next one in the upcoming week. With that being said he tells me also that in the past several days he has been having a lot of issues with green drainage she showed me a picture this is more blue-green in color. He is also been having issues with increased sloughy buildup and the wound does appear to be larger today. Obviously this is not the direction that we  want everything to take based on the starting of his Humira. Nonetheless I think this is definitely a result of likely infection and to be honest I think this is probably Pseudomonas causing the infection based on what I am seeing. 05/24/2019 on evaluation today patient unfortunately appears to be doing significantly worse compared to his prior evaluation with me 2 weeks ago. I did review his culture results which showed that he does have Staph aureus as well as Pseudomonas noted on the culture. Nonetheless the Levaquin that I prescribed for him does not appear to have been appropriate and in fact he tells me he is no longer experiencing the green drainage and discharge that he had at the last visit. Fortunately there is no signs of active infection at this time which is good news although the wound has significantly worsened it in fact is  much deeper than it was previous. We have been utilizing up to this point triamcinolone ointment as the prescription topical of choice but at this time I really feel like that the wound is getting need to be packed in order to appropriately manage this due to the deeper nature of the wound. Therefore something along the lines of an alginate dressing may be more appropriate. 05/31/2019 upon inspection today patient's wound actually showed signs of doing poorly at this point. Unfortunately he just does not seem to be making any good progress despite what we have tried. He actually did go ahead and pick up the Cipro and start taking that as he was noticing more green drainage he had previously completed the Levaquin that I prescribed for him as well. Nonetheless he missed his appointment for the seventh last week on Wednesday with the wound care center and [facility] where his dermatologist referred him. Obviously I do think a second opinion would be helpful at this point especially in light of the fact that the patient seems to be doing so poorly despite the fact that we have tried everything that I really know how at this point. The only thing that ever seems to have helped him in the past is when he was on high doses of continual steroids that did seem to make a difference for him. 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 06/07/2019 upon evaluation today patient unfortunately appears to be doing worse yet again with regard to his wound. In fact I am starting to question whether or not he may have a fluid pocket in the muscle at this point based on the bulging and the soft appearance to the central portion of the muscle area. There is not anything draining from the muscle itself at this time which is good news but nonetheless the wound is  expanding. I am not really seeing any results of the Humira as far as overall wound progression based on what I am seeing at this point. The patient has been referred for second opinion with regard to his wound to the [facility] wound care center by his dermatologist which I definitely am not in opposition to. Unfortunately we tried multiple dressings in the past including collagen, alginate, and at one point even Hydrofera Blue. With that being said he is never really used it for any significant amount of time due to the fact that he often complains of  pain associated with these dressings and then will go back to either using the Santyl which she has done intermittently or more frequently the triamcinolone. He is also using his own compression stockings. We have wrapped him in the past but again that was something else that he really was not a big fan of. 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 He seemed to do much better when he was on the steroids although honestly I understand that the steroids are not necessarily the best medication to be on long-term obviously 06/14/2019 on evaluation today patient appears to be doing actually a little bit better with regard to the overall appearance with his leg. Unfortunately he does continue to have issues with what appears to be some fluid underneath the muscle although he did see the wound specialty center at [facility] last week their main goals were to see about infusion therapy in place of the Humira as they feel like that is not quite strong enough. They also recommended that we continue with the treatment otherwise as we are they felt like that  was appropriate and they are okay with him continuing to follow-up here with [place] in that regard. With that being said they are also sending him to the vein specialist there to see about vein stripping and if that would be of benefit for him. Subsequently they also did not really address whether or not an ultrasound of the muscle area to see if there is anything that needs to be addressed here would be appropriate or not. For that reason I discussed this with him last week I think we may proceed down that road at this point. 06/21/2019 upon evaluation today patient's wound actually appears to be doing slightly better compared to previous evaluations. I do believe that he has made a difference with regard to the progression here with the use of oral steroids. Again in the past has been the only thing that is really calm things down. He does tell me that from [facility] is gotten a good news from there that there are no further vein stripping that is necessary at this point. I do not have that available for review today although the patient did relay this to me. He also did obtain and have the ultrasound of the wound completed which I did sign off on today. It does appear that there is no fluid collection under the muscle this is likely then just edematous tissue in general. That is also good news. Overall I still believe the inflammation is the main issue here. He did inquire about the possibility of a wound VAC again with the muscle protruding like it is I am not really sure whether the wound VAC is necessarily ideal or not. That is something we will have to consider although I do believe he may need compression wrapping to try to help with edema control which could potentially be of benefit. 06/28/2019 on evaluation today patient appears to be doing slightly better measurement wise although this is not terribly smaller he least seems to be trending towards that direction. With that being said he still seems to  have purulent  drainage noted in the wound bed at this time. He has been on Levaquin followed by Cipro over the past month. Unfortunately he still seems to have some issues with active infection at this time. I did perform a culture last week in order to evaluate and see if indeed there was still anything going on. Subsequently the culture did come back Ege, Elye ([id number]) showing Pseudomonas which is consistent with the drainage has been having which is blue-green in color. He also has had an odor that again was somewhat consistent with Pseudomonas as well. Long story short it appears that the culture showed an intermediate finding with regard to how well the Cipro will work for the Pseudomonas infection. Subsequently being that he does not seem to be clearing up and at best what we are doing is just keeping this at [place] I think he may need to see infectious disease to discuss IV antibiotic options. 07/05/2019 upon evaluation today patient appears to be doing okay in regard to his leg ulcer. He has been tolerating the dressing changes at this point without complication. Fortunately there is no signs of active infection at this time which is good news. No fevers, chills, nausea, vomiting, or diarrhea. With that being said he does have an appointment with infectious disease tomorrow and his primary care on Wednesday. Again the reason for the infectious disease referral was due to the fact that he did not seem to be fully resolving with the use of oral antibiotics and therefore we were thinking that IV antibiotic therapy may be necessary secondary to the fact that there was an intermediate finding for how effective the Cipro may be. Nonetheless again he has been having a lot of purulent and even green drainage. Fortunately right now that seems to have calmed down over the past week with the reinitiation of the oral antibiotic. Nonetheless we will see what Dr. Megan Salon has to say. 07/12/2019 upon  evaluation today patient appears to be doing about the same at this point in regard to his left lower extremity ulcer. Fortunately there is no signs of active infection at this time which is good news I do believe the Levaquin has been beneficial I did review Dr. Hale Bogus note and to be honest I agree that the patient's leg does appear to be doing better currently. What we found in the past as he does not seem to really completely resolve he will stop the antibiotic and then subsequently things will revert back to having issues with blue-green drainage, increased pain, and overall worsening in general. Obviously that is the reason I sent him back to infectious disease. 07/19/2019 upon evaluation today patient appears to be doing roughly the same in size there is really no dramatic improvement. He has started back on the Levaquin at this point and though he seems to be doing okay he did still have a lot of blue/green drainage noted on evaluation today unfortunately. I think that this is still indicative more likely of a Pseudomonas infection as previously noted and again he does see Dr. Megan Salon in just a couple of days. I do not know that were really able to effectively clear this with just oral antibiotics alone based on what I am seeing currently. Nonetheless we are still continue to try to manage as best we can with regard to the patient and his wound. I do think the wrap was helpful in decreasing the edema which is excellent news. No fevers, chills, nausea, vomiting, or diarrhea. 07/26/2019  upon evaluation today patient appears to be doing slightly better with regard to the overall appearance of the muscle there is no dark discoloration centrally. Fortunately there is no signs of active infection at this time. No fevers, chills, nausea, vomiting, or diarrhea. Patient's wound bed currently the patient did have an appointment with Dr. Megan Salon at infectious disease last week. With that being said Dr.  Megan Salon the patient states was still somewhat hesitant about put him on any IV antibiotics he wanted [place] to repeat cultures today and then see where things go going forward. He does look like Dr. Megan Salon because of some improvement the patient did have with the Levaquin wanted [place] to see about repeating cultures. If it indeed grows the Pseudomonas again then he recommended a possibility of considering a PICC line placement and IV antibiotic therapy. He plans to see the patient back in 1 to 2 weeks. 08/02/2019 upon evaluation today patient appears to be doing poorly with regard to his left lower extremity. We did get the results of his culture back it shows that he is still showing evidence of Pseudomonas which is consistent with the purulent/blue-green drainage that he has currently. Subsequently the culture also shows that he now is showing resistance to the oral fluoroquinolones which is unfortunate as that was really the only thing to treat the infection prior. I do believe that he is looking like this is going require IV antibiotic therapy to get this under control. Fortunately there is no signs of systemic infection at this time which is good news. The patient does see Dr. Megan Salon tomorrow. 08/09/2019 upon evaluation today patient appears to be doing better with regard to his left lower extremity ulcer in regard to the overall appearance. He is currently on IV antibiotic therapy. As ordered by Dr. Megan Salon. Currently the patient is on ceftazidime which she is going to take for the next 2 weeks and then follow-up for 4 to 5-week appointment with Dr. Megan Salon. The patient started this this past Friday symptoms have not for a total of 3 days currently in full. 08/16/2019 upon evaluation today patient's wound actually does show muscle in the base of the wound but in general does appear to be much better as far as the overall evidence of infection is concerned. In fact I feel like this is for the most  part cleared up he still on the IV antibiotics he has not completed the full course yet but I think he is doing much better which is excellent news. 08/23/2019 upon evaluation today patient appears to be doing about the same with regard to his wound at this point. He tells me that he still has pain unfortunately. Fortunately there is no evidence of systemic infection at this time which is great news. There is significant muscle protrusion. 09/13/19 upon evaluation today patient appears to be doing about the same in regard to his leg unfortunately. He still has a lot of drainage coming from the ulceration there is still muscle exposed. With that being said the patient's last wound culture still showed an intermediate finding with regard to the Pseudomonas he still having the bluish/green drainage as well. Overall I do not know that the wound has completely cleared of infection at this point. Fortunately there is no signs of active infection systemically at this point which is good news. 09/20/2019 upon evaluation today patient's wound actually appears to be doing about the same based on what I am seeing currently. I do not see any  signs of systemic infection he still does have evidence of some local infection and drainage. He did see Dr. Megan Salon last week and Dr. Megan Salon states that he probably does need a different IV antibiotic although he does not want to put him on this until the patient begins the Remicade infusion which is actually scheduled for about 10 days out from today on 13 May. Following that time Dr. Megan Salon is good to see him back and then will evaluate the feasibility of starting him on the IV antibiotic therapy once again at that point. I do not disagree with this plan I do believe as Dr. Megan Salon stated in his note that I reviewed today that the patient's issue is multifactorial with the pyoderma being 1 aspect of this that were hoping the Remicade will be helpful for her. In the  meantime I think the gentamicin is, helping to keep things under decent okay control in regard to the ulcer. 09/27/2019 upon evaluation today patient appears to be doing about the same with regard to his wound still there is a lot of muscle exposure though he does have some hyper granulation tissue noted around the edge and actually some granulation tissue starting to form over the muscle which is actually good news. Fortunately there is no evidence of active infection which is also good news. His pain is less at this point. 5/21; this is a patient I have not seen in a long time. 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 He has a large wound on the left lateral leg with protruding muscle. He comes in the clinic today showing the same area on his left medial ankle. He says there is been a spot there for some time although we have not previously defined this. Today he has a clearly defined area with slight amount of skin breakdown surrounded by raised areas with a purplish hue in color. This is not painful he says it is irritated. 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 He still has muscle protruding from the lateral portion of his left leg but fortunately the new area that they were concerned about at his last visit does not appear to have opened at this point. He is currently on Remicade infusions and seems to be doing better in my opinion in fact the wound itself seems to be overall much better. The purplish discoloration that he did have seems to have resolved and I think that is a good sign that hopefully the Remicade is doing its job. He does Tabbert, Hesham ([id number]) have some biofilm noted over the surface of the wound. 11/01/2019 on evaluation today patient's wound actually appears to be doing excellent at this time. Fortunately there is no  evidence of active infection and overall I feel like he is making great progress. The Remicade seems to be due excellent job in my opinion. 11/08/19 evaluation today vision actually appears to be doing quite well with regard to his weight ulcer. He's been tolerating dressing changes without complication. Fortunately there is no evidence of infection. No fevers, chills, nausea, or vomiting noted at this time. Overall states that is having more itching than pain which is actually a good sign in my opinion. 12/13/2019 upon evaluation today patient appears to be doing well today with regard to his wound. He has been tolerating the dressing changes without complication. Fortunately there is no sign of active  infection at this time. No fevers, chills, nausea, vomiting, or diarrhea. Overall I feel like the infusion therapy has been very beneficial for him. 01/06/2020 on evaluation today patient appears to be doing well with regard to his wound. This is measuring smaller and actually looks to be doing better. Fortunately there is no signs of active infection at this point. No fevers, chills, nausea, vomiting, or diarrhea. With that being said he does still have the blue-green drainage but this does not seem to be causing any significant issues currently. He has been using the gentamicin that does seem to be keeping things under decent control at this point. He goes later this morning for his next infusion therapy for the pyoderma which seems to also be very beneficial. 02/07/2020 on evaluation today patient appears to be doing about the same in regard to his wounds currently. Fortunately there is no signs of active infection systemically he does still have evidence of local infection still using gentamicin. He also is showing some signs of improvement albeit slowly I do feel like we are making some progress here. 02/21/2020 upon evaluation today patient appears to be making some signs of improvement the wound is  measuring a little bit smaller which is great news and overall I am very pleased with where he stands currently. He is going to be having infusion therapy treatment on the 15th of this month. Fortunately there is no signs of active infection at this time. 03/13/2020 I do believe patient's wound is actually showing some signs of improvement here which is great news. He has continue with the infusion therapy through rheumatology/dermatology at [facility]. That does seem to be beneficial. I still think he gets as much benefit from this as he did from the prednisone initially but nonetheless obviously this is less harsh on his body that the prednisone as far as they are concerned. 03/31/2020 on evaluation today patient's wound actually showing signs of some pretty good improvement in regard to the overall appearance of the wound bed. There is still muscle exposed though he does have some epithelial growth around the edges of the wound. Fortunately there is no signs of active infection at this time. No fevers, chills, nausea, vomiting, or diarrhea. 04/24/2020 upon evaluation today patient appears to be doing about the same in regard to his leg ulcer. He has been tolerating the dressing changes without complication. Fortunately there is no signs of active infection at this time. No fevers, chills, nausea, vomiting, or diarrhea. With that being said he still has a lot of irritation from the bandaging around the edges of the wound. We did discuss today the possibility of a referral to plastic surgery. 05/22/2020 on evaluation today patient appears to be doing well with regard to his wounds all things considered. He has not been able to get the Chantix apparently there is a recall nurse that I was unaware of put out by [employer] involuntarily. Nonetheless for now I am and I have to do some research into what may be the best option for him to help with quitting in regard to smoking and we discussed that today. 06/26/2020  upon evaluation today patient appears to be doing well with regard to his wound from the standpoint of infection I do not see any signs of infection at this point. With that being said unfortunately he is still continuing to have issues with muscle exposure and again he is not having a whole lot of new skin growth unfortunately. There does not  appear to be any signs of active infection at this time. No fevers, chills, nausea, vomiting, or diarrhea. 07/10/2020 upon evaluation today patient appears to be doing a little bit more poorly currently compared to where he was previous. I am concerned currently about an active infection that may be getting worse especially in light of the increased size and tenderness of the wound bed. No fevers, chills, nausea, vomiting, or diarrhea. 07/24/2020 upon evaluation today patient appears to be doing poorly in regard to his leg ulcer. He has been tolerating the dressing changes without complication but unfortunately is having a lot of discomfort. 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 Subsequently I think he is going require possibly IV antibiotics to get this under control. I am very concerned about the severity of his infection and the amount of discomfort he is having. 07/31/2020 upon evaluation today patient appears to be doing about the same in regard to his leg wound. He did see Dr. Megan Salon and Dr. Megan Salon is actually going to start him on IV antibiotics. He goes for the PICC line tomorrow. With that being said there do not have that run for 2 weeks and then see how things are doing and depending on how he is progressing they may extend that a little longer. Nonetheless I am glad this is getting ready to be in place and definitely feel it may help the patient. In the meantime is been using mainly triamcinolone to the wound bed has an anti-inflammatory. 08/07/2020 on evaluation today patient  appears to be doing well with regard to his wound compared even last week. In the interim he has gotten the PICC line placed and overall this seems to be doing excellent. There does not appear to be any evidence of infection which is great news systemically although locally of course has had the infection this appears to be improving with the use of the antibiotics. 08/14/2020 upon evaluation today patient's wound actually showing signs of excellent improvement. Overall the irritation has significantly improved the drainage is back down to more of a normal level and his pain is really pretty much nonexistent compared to what it was. Obviously I think that this is significantly improved secondary to the IV antibiotic therapy which has made all the difference in the world. Again he had a resistant form of Pseudomonas for which oral antibiotics just was not cutting it. Nonetheless I do think that still we need to consider the possibility of a surgical closure for this wound is been open so long and to be honest with muscle exposed I think this can be very hard to get this to close outside of this although definitely were still working to try to do what we can in that regard. 08/21/2020 upon evaluation today patient appears to be doing very well with regard to his wounds on the left lateral lower extremity/calf area. Fortunately there does not appear to be signs of active infection which is great news and overall very pleased with where things stand today. He is actually wrapping up his treatment with IV antibiotics tomorrow. After that we will see where things go from there. 08/28/2020 upon evaluation today patient appears to be doing decently well with regard to his leg ulcer. There does not appear to be any signs of Dutan, Devonne ([id number]) active infection which is great news and overall very pleased with where things stand today. No fevers, chills, nausea, vomiting, or diarrhea. 09/18/2020 upon  evaluation  today patient appears to be doing well with regard to his infection which I feel like is better. Unfortunately he is not doing as well with regard to the overall size of the wound which is not nearly as good at this point. I feel like that he may be having an issue here with the pyoderma being somewhat out of control. I think that he may benefit from potentially going back and talking to the dermatologist about what to do from the pyoderma standpoint. I am not certain if the infusions are helping nearly as much is what the prednisone did in the past. 10/02/2020 upon evaluation today patient appears to be doing well with regard to his leg ulcer. He did go to the Psychiatric nurse. Unfortunately they feel like there is a 10% chance that most that he would be able to heal and that the skin graft would take. Obviously this has led him to not be able to go down that path as far as treatment is concerned. Nonetheless he does seem to be doing a little bit better with the prednisone that I gave him last time. I think that he may need to discuss with dermatology the possibility of long-term prednisone as that seems to be what is most helpful for him to be perfectly honest. I am not sure the Remicade is really doing the job. 10/17/2020 upon evaluation today patient appears to be doing a little better in regard to his wound. In fact the case has been since we did the prednisone on May 2 for him that we have noticed a little bit of improvement each time we have seen a size wise as well as appearance wise as well as pain wise. I think the prednisone has had a greater effect then the infusion therapy has to be perfectly honest. With that being said the patient also feels significantly better compared to what he was previous. All of this is good news but nonetheless I am still concerned about the fact that again we are really not set up to long-term manage him as far as prednisone is concerned. Obviously there are things  that you need to be watched I completely understand the risk of prednisone usage as well. 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 With all that being said I do believe that we can give him another round of the prednisone which she is requesting today because of the improvement that he seen since we did that first round. 10/30/2020 upon evaluation today patient's wound actually is showing signs of doing quite well. There does not appear to be any evidence of infection which is great news and overall very pleased with where things stand today. No fevers, chills, nausea, vomiting, or diarrhea. He tells me that the prednisone still has seem to have helped he wonders if we can extend that for just a little bit longer. He did not have the appointment with a dermatologist although he did have an infusion appointment last Friday. That was at [facility]. With that being said he tells me he could not do both that as well as the appointment with the physician on the same day therefore that is can have to be rescheduled. I really want to see if there is anything they feel like that could be done differently to try to help this out as I am not really certain that the infusions are helping significantly here. 11/13/2020 upon evaluation  today patient unfortunately appears to be doing somewhat poorly in regard to his wound I feel like this is actually worsening from the standpoint of the pyoderma spreading. I still feel like that he may need something different as far as trying to manage this going forward. Again we did the prednisone unfortunately his blood sugars are not doing so well because of this. Nonetheless I believe that the patient likely needs to try topical steroid. We have done triamcinolone for a while I think going with something stronger such as clobetasol could be beneficial again this is not something I do lightly I discussed this with the patient that again  this does not normally put underneath an occlusive dressing. Nonetheless I think a thin film as such could help with some of the stronger anti-inflammatory effects. We discussed this today. He would like to try to give this a trial for the next couple weeks. I definitely think that is something that we can do. Evaluate7/03/2021 and today patient's wound bed actually showed signs of doing really about the same. There was a little expansion of the size of the wound and that leading edge that we done looking out although the clobetasol does seem to have slowed this down a bit in my opinion. There is just 1 small area that still seems to be progressing based on what I see. Nonetheless I am concerned about the fact this does not seem to be improving if anything seems to be doing a little bit worse. I do not know that the infusions are really helping him much as next infusion is August 5 his appointment with dermatology is July 25. Either way I really think that we need to have a conversation potentially about this and I am actually going to see if I can talk with Dr. Lillia Carmel in order to see where things stand as well. 12/11/2020 upon evaluation today patient appears to be doing worse in regard to his leg ulcer. Unfortunately I just do not think this is making the progress that I would like to see at this point. Honestly he does have an appointment with dermatology and this is in 2 days. I am wondering what they may have to offer to help with this. Right now what I am seeing is that he is continuing to show signs of worsening little by little. Obviously that is not great at all. Is the exact opposite of what we are looking for. 12/18/2020 upon evaluation today patient appears to be doing a little better in regard to his wound. The dermatologist actually did do some steroid injections into the wound which does seem to have been beneficial in my opinion. That was on the 25th already this looks a little better  to me than last time I saw him. With that being said we did do a culture and this did show that he has Staph aureus noted in abundance in the wound. With that being said I do think that getting him on an oral antibiotic would be appropriate as well. Also think we can compression wrap and this will make a difference as well. 12/28/2020 upon evaluation today patient's wound is actually showing signs of doing much better. I do believe the compression wrap is helping he has a lot of drainage but to be honest I think that the compression is helping to some degree in this regard as well as not draining through which is also good news. No fevers, chills, nausea, vomiting, or diarrhea. 01/04/2021 upon  evaluation today patient appears to be doing well with regard to his wound. Overall things seem to be doing quite well. He did have a little bit of reaction to the CarboFlex Sorbact he will be using that any longer. With that being said he is controlled as far as the drainage is concerned overall and seems to be doing quite well. I do not see any signs of active infection at this time which is great news. No fevers, chills, nausea, vomiting, or diarrhea. 01/11/2021 upon evaluation today patient appears to be doing well with regard to his wounds. He has been tolerating the dressing changes without complication. Fortunately there does not appear to be any signs of active infection at this time which is great news. Overall I am extremely pleased with where we stand currently. No fevers, chills, nausea, vomiting, or diarrhea. Where using clobetasol in the wound bed he has a lot of new skin growth which is awesome as well. 01/18/2021 upon evaluation today patient appears to be doing very well in regard to his leg ulcer. He has been tolerating the dressing changes without complication. Fortunately there does not appear to be any signs of active infection which is great news. In general I think that he is making excellent  progress 01/25/2021 upon evaluation today patient appears to be doing well with regard to his wound on the leg. I am actually extremely pleased with where things stand today. There does not appear to be any signs of active infection which is great news and overall I think that we are definitely headed in the appropriate direction based on what I am seeing currently. There does not appear to be any signs of active infection also excellent news. 02/06/2021 upon evaluation today patient appears to be doing well with regard to his wound. Overall visually this is showing signs of significant [name], [name] ([id number]) improvement which is great news. I do not see any signs of active infection systemically which is great even locally I do not think that we are seeing any major complications here. We did do fluorescence imaging with the [employer] DX today. The patient does have some odor and drainage noted and again this is something that I think would benefit him to probably come more frequently for nurse visits. 02/19/2021 upon evaluation today patient actually appears to be doing quite well in regard to his wound. He has been tolerating the dressing changes without complication and overall I think that this is making excellent progress. I do not see any evidence of active infection at this point which is great news as well. No fevers, chills, nausea, vomiting, or diarrhea. 10/10; wound is made nice progress healthy granulation with a nice rim of epithelialization which seems to be expanding even from last week he has a deeper area in the inferior part of the more distal part of the wound with not quite as healthy as surface. This area will need to be followed. Using clobetasol and Hydrofera Blue 03/05/2021 upon evaluation today patient appears to be doing very well in regard to his leg ulcer. He has been tolerating dressing changes without complication. Fortunately there does not appear to be any signs of  active infection which is great news and overall I am extremely pleased with where we stand currently. Objective Constitutional Well-nourished and well-hydrated in no acute distress. Vitals Time Taken: 8:18 AM, Height: 71 in, Weight: 338 lbs, BMI: 47.1, Temperature: 98.3 F, Pulse: 81 bpm, Respiratory Rate: 18 breaths/min, Blood Pressure: 143/84 mmHg.  Respiratory normal breathing without difficulty. Psychiatric this patient is able to make decisions and demonstrates good insight into disease process. Alert and Oriented x 3. pleasant and cooperative. General Notes: Upon inspection patient's wound bed showed signs of good granulation and epithelization at this point. Fortunately there does not appear to be any evidence of infection and I think he is making great progress with regard to the overall wound healing I think that were still on track to accomplish getting this completely closed hopefully in the near future. Integumentary (Hair, Skin) Wound #1 status is Open. Original cause of wound was Gradually Appeared. The date acquired was: 11/18/2015. The wound has been in treatment 221 weeks. The wound is located on the Left,Lateral Lower Leg. The wound measures 5.2cm length x 3.6cm width x 0.4cm depth; 14.703cm^2 area and 5.881cm^3 volume. There is tendon and Fat Layer (Subcutaneous Tissue) exposed. There is no tunneling or undermining noted. There is a medium amount of serosanguineous drainage noted. There is large (67-100%) red granulation within the wound bed. There is a small (1-33%) amount of necrotic tissue within the wound bed including Adherent Slough. 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 There was a Four Layer Compression Therapy Sedlack, Timon ([id number]) Procedure with a pre-treatment ABI of 1.2 by Dolan Amen, RN. Post procedure Diagnosis Wound #1: Same as Pre-Procedure Plan Follow-up Appointments: Return Appointment in 1 week. Nurse Visit as needed - twice a week Bathing/ Shower/ Hygiene: Clean wound with Normal Saline or wound cleanser. Other: - Apply dakins soaked gauze on wound after cleansing wound IN OFFICE ONLY Edema Control - Lymphedema / Segmental Compressive Device / Other: Optional: One layer of unna paste to top of compression wrap (to act as an anchor). Elevate, Exercise Daily and Avoid Standing for Long Periods of Time. Elevate legs to the level of the heart and pump ankles as often as possible Elevate leg(s) parallel to the floor when sitting. WOUND #1: - Lower Leg Wound Laterality: Left, Lateral Cleanser: Wound Cleanser 3 x Per Week/30 Days Discharge Instructions: Wash your hands with soap and water. Remove old dressing, discard into plastic bag and place into trash. Cleanse the wound with Wound Cleanser prior to applying a clean dressing using gauze sponges, not tissues or cotton balls. Do not scrub or use excessive force. Pat dry using gauze sponges, not tissue or cotton balls. Peri-Wound Care: Moisturizing Lotion 3 x Per Week/30 Days Discharge Instructions: Suggestions: Theraderm, Eucerin, Cetaphil, or patient preference. Topical: Clobetasol Propionate ointment 0.05%, 60 (g) tube 3 x Per Week/30 Days Discharge Instructions: Apply to 7 o'clock depth Primary Dressing: Hydrofera Blue Ready Transfer Foam, 4x5 (in/in) 3 x Per Week/30 Days Discharge Instructions: Apply Hydrofera Blue Ready to wound bed as directed Secondary Dressing: Xtrasorb Medium 4x5 (in/in) 3 x Per Week/30 Days Discharge Instructions: Apply to wound as directed.  Do not cut. Compression Wrap: Medichoice 4 layer Compression System, 35-40 mmHG (Generic) 3 x Per Week/30 Days Discharge Instructions: Apply multi-layer wrap as directed. 1. Would recommend currently that we go ahead and continue with wound care measures as before and the patient is in agreement with the plan this includes the use of the clobetasol along with  the [facility] which I think is doing an awesome job. 2. I am also can recommend that we have the patient continue with the XtraSorb to cover which I think is doing a good job as well 3. We will also continue with the 4-layer compression wrap. We will see patient back for reevaluation in 1 week here in the clinic. If anything worsens or changes patient will contact our office for additional recommendations. Electronic Signature(s) Signed: 03/05/2021 9:15:15 AM By: Worthy Keeler PA-C Entered By: Worthy Keeler on 03/05/2021 09:15:15 Chap, [name] ([id number]) -------------------------------------------------------------------------------- SuperBill Details Patient Name: [name] Date of Service: 03/05/2021 Medical Record Number: [id number] Patient Account Number: [id number] Date of Birth/Sex: [birth year] (40 y.o. M) Treating RN: Dolan Amen Primary Care Provider: [name] Other Clinician: Referring Provider: [name] Treating Provider/Extender: [name] Cliche in Treatment: 221 Diagnosis Coding ICD-10 Codes Code Description [phone] Non-pressure chronic ulcer of left calf with fat layer exposed L88 Pyoderma gangrenosum L97.321 Non-pressure chronic ulcer of left ankle limited to breakdown of skin I87.2 Venous insufficiency (chronic) (peripheral) L03.116 Cellulitis of left lower limb F17.208 Nicotine dependence, unspecified, with other nicotine-induced disorders Facility Procedures CPT4 Code: 32919166 Description: (Facility Use Only) [id number] - [place] LWR LT LEG Modifier: Quantity:  1 Physician Procedures CPT4 Code: 9774142 Description: 39532 - WC PHYS LEVEL 4 - EST PT Modifier: Quantity: 1 CPT4 Code: Description: ICD-10 Diagnosis Description L97.222 Non-pressure chronic ulcer of left calf with fat layer exposed L88 Pyoderma gangrenosum L97.321 Non-pressure chronic ulcer of left ankle limited to breakdown of skin I87.2 Venous insufficiency (chronic)  (peripheral) Modifier: Quantity: Electronic Signature(s) Signed: 03/05/2021 9:15:36 AM By: Worthy Keeler PA-C Entered By: Worthy Keeler on 03/05/2021 09:15:36

## 2021-03-06 ENCOUNTER — Encounter: Payer: 59 | Admitting: Physician Assistant

## 2021-03-07 ENCOUNTER — Other Ambulatory Visit: Payer: Self-pay

## 2021-03-07 DIAGNOSIS — L97222 Non-pressure chronic ulcer of left calf with fat layer exposed: Secondary | ICD-10-CM | POA: Diagnosis not present

## 2021-03-08 NOTE — Progress Notes (Signed)
ENDI, LAGMAN ([id number]) Visit Report for 03/07/2021 Arrival Information Details Patient Name: [name], [name] Date of Service: 03/07/2021 8:00 AM Medical Record Number: [id number] Patient Account Number: [id number] Date of Birth/Sex: 1981-02-05 (40 y.o. M) Treating RN: [name] Primary Care Teighlor Korson: [name] Other Clinician: Referring Shawna Kiener: [name] Treating Lamiah Marmol/Extender: [name] Cliche in Treatment: 78 Visit Information History Since Last Visit Added or deleted any medications: No Patient Arrived: Ambulatory Has Dressing in Place as Prescribed: Yes Arrival Time: 08:32 Has Compression in Place as Prescribed: Yes Accompanied By: self Pain Present Now: No Transfer Assistance: None Patient Identification Verified: Yes Secondary Verification Process Completed: Yes Patient Requires Transmission-Based No Precautions: Patient Has Alerts: Yes Patient Alerts: Patient has reaction to silver dressings. Electronic Signature(s) Signed: 03/08/2021 2:59:00 PM By: [name], [name], RN, [name], [name], [name] Entered By: [name], [name], RN, [name], [name] on 03/07/2021 08:32:21 DONTARIO, EVETTS ([id number]) -------------------------------------------------------------------------------- Compression Therapy Details Patient Name: [name] Date of Service: 03/07/2021 8:00 AM Medical Record Number: [id number] Patient Account Number: [id number] Date of Birth/Sex: 02/28/1981 (40 y.o. M) Treating RN: [name] Primary Care [place] Capers: [name] Other Clinician: Referring Tomeshia Pizzi: [name] Treating Jyles [name]/Extender: [name] Cliche in Treatment: 222 Compression Therapy Performed for Wound Assessment: Wound #1 Left,Lateral Lower Leg Performed By: Clinician [name], RN Compression Type: Four Layer Pre Treatment ABI: 1.2 Electronic Signature(s) Signed: 03/08/2021 2:59:00 PM By: [name], [name], RN, [name], [name], [name] Entered By: [name], [name], RN, [name], [name] on 03/07/2021  08:35:57 [name] ([id number]) -------------------------------------------------------------------------------- Encounter Discharge Information Details Patient Name: [name] Date of Service: 03/07/2021 8:00 AM Medical Record Number: [id number] Patient Account Number: [id number] Date of Birth/Sex: 06-17-1980 (40 y.o. M) Treating RN: [name] Primary Care Glenora Morocho: [name] Other Clinician: Referring [place]: [name] Treating Amory Simonetti/Extender: [name] Cliche in Treatment: 222 Encounter Discharge Information Items Discharge Condition: Stable Ambulatory Status: Ambulatory Discharge Destination: Home Transportation: Private Auto Accompanied By: self Schedule Follow-up Appointment: Yes Clinical Summary of Care: Electronic Signature(s) Signed: 03/08/2021 2:59:00 PM By: [name], [name], RN, [name], [name], [name] Entered By: [name], [name], RN, [name], [name] on 03/07/2021 08:49:48 [name] ([id number]) -------------------------------------------------------------------------------- Wound Assessment Details Patient Name: [name] Date of Service: 03/07/2021 8:00 AM Medical Record Number: [id number] Patient Account Number: [id number] Date of Birth/Sex: Dec 27, 1980 (40 y.o. M) Treating RN: [name] Primary Care Milianna Ericsson: [name] Other Clinician: Referring Corby Vandenberghe: [name] Treating Briggette Najarian/Extender: [name] Cliche in Treatment: 222 Wound Status Wound Number: 1 Primary Etiology: Pyoderma Wound Location: Left, Lateral Lower Leg Wound Status: Open Wounding Event: Gradually Appeared Comorbid History: Sleep Apnea, Hypertension, Colitis Date Acquired: 11/18/2015 Weeks Of Treatment: 222 Clustered Wound: No Wound Measurements Length: (cm) 4.5 Width: (cm) 3.2 Depth: (cm) 0.4 Area: (cm) 11.31 Volume: (cm) 4.524 % Reduction in Area: -130.4% % Reduction in Volume: -15.2% Epithelialization: Medium (34-66%) Wound Description Classification: Full  Thickness With Exposed Support Structures Exudate Amount: Medium Exudate Type: Serosanguineous Exudate Color: red, brown Foul Odor After Cleansing: No Slough/Fibrino Yes Wound Bed Granulation Amount: Large (67-100%) Exposed Structure Granulation Quality: Red, Hyper-granulation Fascia Exposed: No Necrotic Amount: Small (1-33%) Fat Layer (Subcutaneous Tissue) Exposed: Yes Necrotic Quality: Adherent Slough Tendon Exposed: Yes Muscle Exposed: No Joint Exposed: No Bone Exposed: No Treatment Notes Wound #1 (Lower Leg) Wound Laterality: Left, Lateral Cleanser Wound Cleanser Discharge Instruction: Wash your hands with soap and water. Remove old dressing, discard into plastic bag and place into trash. Cleanse the wound with Wound Cleanser prior to applying a clean dressing using gauze sponges,  not tissues or cotton balls. Do not scrub or use excessive force. Pat dry using gauze sponges, not tissue or cotton balls. Peri-Wound Care Moisturizing Lotion Discharge Instruction: Suggestions: Theraderm, Eucerin, Cetaphil, or patient preference. Topical Clobetasol Propionate ointment 0.05%, 60 (g) tube Discharge Instruction: Apply to 7 o'clock depth Primary Dressing Hydrofera Blue Ready Transfer Foam, 4x5 (in/in) Discharge Instruction: Apply Hydrofera Blue Ready to wound bed as directed Secondary Dressing [employer] 4x5 (in/in) BRYLEE, MCGREAL ([id number]) Discharge Instruction: Apply to wound as directed. Do not cut. Secured With Compression Wrap Medichoice 4 layer Compression System, 35-40 mmHG Discharge Instruction: Apply multi-layer wrap as directed. Compression Stockings Environmental education officer) Signed: 03/08/2021 2:59:00 PM By: [name], [name], RN, [name], [name], [name] Entered By: [name], [name], RN, [name], [name] on 03/07/2021 09:62:83

## 2021-03-09 ENCOUNTER — Other Ambulatory Visit: Payer: Self-pay

## 2021-03-09 DIAGNOSIS — L97222 Non-pressure chronic ulcer of left calf with fat layer exposed: Secondary | ICD-10-CM | POA: Diagnosis not present

## 2021-03-12 ENCOUNTER — Other Ambulatory Visit: Payer: Self-pay

## 2021-03-12 ENCOUNTER — Encounter: Payer: 59 | Admitting: Physician Assistant

## 2021-03-12 DIAGNOSIS — L97222 Non-pressure chronic ulcer of left calf with fat layer exposed: Secondary | ICD-10-CM | POA: Diagnosis not present

## 2021-03-12 NOTE — Progress Notes (Signed)
DEIONDRE, HARROWER ([id number]) Visit Report for 03/09/2021 Arrival Information Details Patient Name: [name], [name] Date of Service: 03/09/2021 8:00 AM Medical Record Number: [id number] Patient Account Number: [id number] Date of Birth/Sex: 1980-07-01 (40 y.o. M) Treating RN: [name] Primary Care Kody Vigil: [name] Other Clinician: Referring Ruhan Borak: [name] Treating Joeziah Voit/Extender: [name] Cliche in Treatment: 28 Visit Information History Since Last Visit Added or deleted any medications: No Patient Arrived: Ambulatory Has Dressing in Place as Prescribed: Yes Arrival Time: 08:05 Has Compression in Place as Prescribed: Yes Accompanied By: self Pain Present Now: No Transfer Assistance: None Patient Identification Verified: Yes Secondary Verification Process Completed: Yes Patient Requires Transmission-Based No Precautions: Patient Has Alerts: Yes Patient Alerts: Patient has reaction to silver dressings. Electronic Signature(s) Signed: 03/12/2021 4:30:36 PM By: [name], [name], RN, [name], [name], [name] Entered By: [name], [name], RN, [name], [name] on 03/09/2021 08:05:28 [name] ([id number]) -------------------------------------------------------------------------------- Compression Therapy Details Patient Name: [name] Date of Service: 03/09/2021 8:00 AM Medical Record Number: [id number] Patient Account Number: [id number] Date of Birth/Sex: Sep 10, 1980 (40 y.o. M) Treating RN: [name] Primary Care Dmarco Baldus: [name] Other Clinician: Referring Chealsey Miyamoto: [name] Treating Nyaire Denbleyker/Extender: [name] Cliche in Treatment: 222 Compression Therapy Performed for Wound Assessment: Wound #1 Left,Lateral Lower Leg Performed By: Clinician [name], RN Compression Type: Four Layer Pre Treatment ABI: 1.2 Electronic Signature(s) Signed: 03/12/2021 4:30:36 PM By: [name], [name], RN, [name], [name], [name] Entered By: [name], [name], RN, [name], [name] on 03/09/2021  08:28:06 [name] ([id number]) -------------------------------------------------------------------------------- Encounter Discharge Information Details Patient Name: [name] Date of Service: 03/09/2021 8:00 AM Medical Record Number: [id number] Patient Account Number: [id number] Date of Birth/Sex: 12/10/1980 (40 y.o. M) Treating RN: [name] Primary Care Lylie Blacklock: [name] Other Clinician: Referring Ori Kreiter: [name] Treating Canna Nickelson/Extender: [name] Cliche in Treatment: 222 Encounter Discharge Information Items Discharge Condition: Stable Ambulatory Status: Ambulatory Discharge Destination: Home Transportation: Private Auto Accompanied By: self Schedule Follow-up Appointment: Yes Clinical Summary of Care: Electronic Signature(s) Signed: 03/12/2021 4:30:36 PM By: [name], [name], RN, [name], [name], [name] Entered By: [name], [name], RN, [name], [name] on 03/09/2021 08:28:46 [name] ([id number]) -------------------------------------------------------------------------------- Wound Assessment Details Patient Name: [name] Date of Service: 03/09/2021 8:00 AM Medical Record Number: [id number] Patient Account Number: [id number] Date of Birth/Sex: 1980/06/13 (40 y.o. M) Treating RN: [name] Primary Care Marly Schuld: [name] Other Clinician: Referring Andi Layfield: [name] Treating Lloyde Ludlam/Extender: [name] Cliche in Treatment: 222 Wound Status Wound Number: 1 Primary Etiology: Pyoderma Wound Location: Left, Lateral Lower Leg Wound Status: Open Wounding Event: Gradually Appeared Comorbid History: Sleep Apnea, Hypertension, Colitis Date Acquired: 11/18/2015 Weeks Of Treatment: 222 Clustered Wound: No Wound Measurements Length: (cm) 3.5 Width: (cm) 4 Depth: (cm) 0.4 Area: (cm) 10.996 Volume: (cm) 4.398 % Reduction in Area: -124% % Reduction in Volume: -12% Epithelialization: Medium (34-66%) Wound Description Classification: Full  Thickness With Exposed Support Structures Exudate Amount: Medium Exudate Type: Serosanguineous Exudate Color: red, brown Foul Odor After Cleansing: No Slough/Fibrino Yes Wound Bed Granulation Amount: Large (67-100%) Exposed Structure Granulation Quality: Red, Hyper-granulation Fascia Exposed: No Necrotic Amount: Small (1-33%) Fat Layer (Subcutaneous Tissue) Exposed: Yes Necrotic Quality: Adherent Slough Tendon Exposed: Yes Muscle Exposed: No Joint Exposed: No Bone Exposed: No Electronic Signature(s) Signed: 03/12/2021 4:30:36 PM By: [name], [name], RN, [name], [name], [name] Entered By: [name], [name], RN, [name], [name] on 03/09/2021 08:10:05

## 2021-03-12 NOTE — Progress Notes (Addendum)
NAMAN, SPYCHALSKI ([id number]) Visit Report for 03/12/2021 Chief Complaint Document Details Patient Name: [name], [name] Date of Service: 03/12/2021 8:00 AM Medical Record Number: [id number] Patient Account Number: [id number] Date of Birth/Sex: Jun 30, 1980 (40 y.o. 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 M) Treating RN: Carlene Coria Primary Care Provider: [name] Other Clinician: Referring Provider: [name] Treating Provider/Extender: [name] Cliche in Treatment: 222 History of Present Illness HPI Description: 12/04/16; 40 year old man who comes into the clinic today for review of a wound on the posterior left calf. He tells me that is been there for about a year. He is not a diabetic he does smoke half a pack per day. He was seen in the ER on 11/20/16 felt to have cellulitis around the wound and was given clindamycin. An x-ray did not show osteomyelitis. The patient initially tells me that he has a milk allergy that sets off a pruritic itching rash on his lower legs which she scratches incessantly and he thinks that's what may have set up the wound. He has been using various topical antibiotics and ointments without any effect. He works in a [employer] and is on his feet all day. He does not have a  prior history of wounds however he does have the rash on both lower legs the right arm and the ventral aspect of his left arm. These are excoriations and clearly have had scratching however there are of macular looking areas on both legs including a substantial larger area on the right leg. This does not have an underlying open area. There is no blistering. The patient tells me that 2 years ago in [place] in response to the rash on his legs he saw a dermatologist who told him he had a condition which may be pyoderma gangrenosum although I may be putting words into his mouth. He seemed to recognize this. On further questioning he admits to a 5 year history of quiesced. ulcerative colitis. He is not in any treatment for this. He's had no recent travel 12/11/16; the patient arrives today with his wound and roughly the same condition we've been using silver alginate this is a deep punched out wound with some surrounding erythema but no tenderness. Biopsy I did did not show confirmed pyoderma gangrenosum suggested nonspecific inflammation and vasculitis but does not provide an actual description of what was seen by the pathologist. I'm really not able to understand this We have also received information from the patient's dermatologist in [place] notes from April 2016. This was a doctor Agarwal-antal. 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 He was prescribed topical steroid high potency under occlusion which helped but at this point the patient did not have a deep punched out wound. 12/18/16; the patient's wound is larger in terms of surface area however this surface looks better and there is less depth. The surrounding erythema also is better. The patient states that the wrap we put on came off 2 days ago when he has been using his compression stockings.  He we are in the process of getting a dermatology consult. 12/26/16 on evaluation today patient's left lower extremity wound shows evidence of  infection with surrounding erythema noted. He has been tolerating the dressing changes but states that he has noted more discomfort. There is a larger area of erythema surrounding the wound. No fevers, chills, nausea, or vomiting noted at this time. With that being said the wound still does have slough covering the surface. He is not allergic to any medication that he is aware of at this point. In regard to his right lower extremity he had several regions that are erythematous and pruritic he wonders if there's anything we can do to help that. 01/02/17 I reviewed patient's wound culture which was obtained his visit last week. He was placed on doxycycline at that point. Unfortunately that does not appear to be an antibiotic that would likely help with the situation however the pseudomonas noted on culture is sensitive to Cipro. Also unfortunately patient's wound seems to have a large compared to last week's evaluation. Not severely so but there are definitely increased measurements in general. He is continuing to have discomfort as well he writes this to be a seven out of 10. In fact he would prefer me not to perform any debridement today due to the fact that he is having discomfort and considering he has an active infection on the little reluctant to do so anyway. No fevers, chills, nausea, or vomiting noted at this time. 01/08/17; patient seems dermatology on September 5. I suspect dermatology will want the slides from the biopsy I did sent to their pathologist. I'm not sure if there is a way we can expedite that. In any case the culture I did before I left on vacation 3 weeks ago showed Pseudomonas he was given 10 days of Cipro and per her description of her intake nurses is actually somewhat better this week although the wound is quite a bit bigger than I remember the last time I saw this. He still has 3 more days of Cipro 01/21/17; dermatology appointment tomorrow. He has completed the ciprofloxacin  for Pseudomonas. Surface of the wound looks better however he is had some deterioration in the lesions on his right leg. Meantime the left lateral leg wound we will continue with sample 01/29/17; patient had his dermatology appointment but I can't yet see that note. He is completed his antibiotics. The wound is more superficial but considerably larger in circumferential area than when he came in. This is in his left lateral calf. He also has swollen erythematous areas with superficial wounds on the right leg and small papular areas on both arms. There apparently areas in her his upper thighs and buttocks I did not look at those. Dermatology biopsied the right leg. Hopefully will have their input next week. 02/05/17; patient went back to see his dermatologist who told him that he had a "scratching problem" as well as staph. He is now on a 30 day course of doxycycline and I believe she gave him triamcinolone cream to the right leg areas to help with the itching [not exactly sure but probably triamcinolone]. She apparently looked at the left lateral leg wound although this was not rebiopsied and I think felt to be ultimately part of the same pathogenesis. He is using sample border foam and changing nevus himself. He now has a new open area on the right posterior leg which was his biopsy site I don't have any of the dermatology notes  02/12/17; we put the patient in compression last week with SANTYL to the wound on the left leg and the biopsy. Edema is much better and the depth of the wound is now at level of skin. Area is still the same oBiopsy site on the right lateral leg we've also been using santyl with a border foam dressing and he is changing this himself. 02/19/17; Using silver alginate started last week to both the substantial left leg wound and the biopsy site on the right wound. He is tolerating compression well. Has a an appointment with his primary M.D. tomorrow wondering about diuretics although  I'm wondering if the edema problem is actually lymphedema 02/26/17; the patient has been to see his primary doctor Dr. Jerrel Ivory at [facility] [facility] our primary care. She started him on Lasix 20 mg and this seems to have helped with the edema. However we are not making substantial change with the left lateral calf wound and inflammation. The biopsy site on the right leg also looks stable but not really all that different. 03/12/17; the patient has been to see vein and vascular Dr. Lucky Cowboy. He has had venous reflux studies I have not reviewed these. I did get a call from his dermatology office. They felt that he might have pathergy based on their biopsy on his right leg which led them to look at the slides of [name], [name] ([id number]) the biopsy I did on the left leg and they wonder whether this represents pyoderma gangrenosum which was the original supposition in a man with ulcerative colitis albeit inactive for many years. They therefore recommended clobetasol and tetracycline i.e. 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 03/26/17; apparently the patient just had reflux studies not an appointment with Dr. dew. She arrives in clinic today having applied clobetasol for 2-3 weeks. He notes over the last 2-3 days excessive drainage having to change the dressing 3-4 times a day and also expanding erythema. 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 Lillia Carmel at [facility] dermatology locally who kindly saw him at my request. 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 My concern was the degree of erythema medially extending from his left leg wound which was either inflammation from pyoderma  or cellulitis. I put him on Augmentin however culture of the wound showed Pseudomonas which is quinolone sensitive. I really don't believe he has cellulitis however in view of everything I will continue and give him a course of Cipro. He is also on doxycycline as an immune modulator for the pyoderma. In addition to his original wound on the left lateral leg with surrounding erythema he has a wound on the right posterior calf which was an original biopsy site done by dermatology. 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 Saw Dr. Lillia Carmel yesterday. He has been using topical antibiotics to both wound areas his original wound on the left and the biopsies/pathergy area on the right. There is definitely some improvement in the inflammation around the wound on the right although the patient states he has increasing sensitivity of the wounds. He is on prednisone 60 and doxycycline 1 as prescribed by Dr. Lillia Carmel. He is covering the topical antibiotic with gauze  and putting this in his own compression stocks and changing this daily. He states that Dr. Lottie Rater did a culture of the left leg wound yesterday 05/07/17; pyoderma gangrenosum. The patient saw Dr. Lillia Carmel yesterday and has a follow-up with her in one month. He is still using topical antibiotics to both wounds although he can't recall exactly what type. He is still on prednisone 60 mg. Dr. Lillia Carmel stated that the doxycycline could stop if we were in agreement. 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 The right medial leg was induced by biopsy/pathergy. The area on the right is essentially healed. 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 The right medial leg has closed and remains closed. He  is still on prednisone 60. oHe tells me he missed his last dermatology appointment with Dr. Lillia Carmel but will make another appointment. He reports that her blood sugar at a recent screen in [place] was high 200's. He was 180 today. 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 I think he is going to require still much longer prednisone perhaps another 3 months before attempting to taper. In the meantime his wound is a lot better. Smaller. He is cleaning this off daily and applying topical antibiotics. When he was last in the clinic I thought about changing to [facility] and actually put in a couple of calls to dermatology although probably not during their business hours. 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 He continues on oral prednisone. He has been using triple antibiotic ointment. There is surface debris and we will transition to [facility] and have him return in 2 weeks. He has lost 30 pounds since his last appointment with lifestyle modification. He may benefit from topical steroid cream for treatment this can be considered at a later date. 08/22/17 on evaluation today patient appears to actually be doing rather well in regard to his left lateral lower extremity ulcer. He has actually been managed by Dr. Dellia Nims most recently. Patient is currently on oral steroids at this time. This seems to have been of benefit for him. Nonetheless his last visit was actually with Leah on 08/06/17. Currently he is not utilizing any topical steroid creams although this could be of benefit as well. No fevers, chills, nausea, or vomiting noted at this time. 09/05/17 on evaluation today patient appears to be doing better in regard to his left lateral lower extremity ulcer. He has been tolerating the dressing changes without complication. He is using Santyl with good effect. Overall I'm  very pleased with how things are standing at this point. Patient likewise is happy that this is doing better. 09/19/17 on evaluation today patient actually appears to be doing rather well in regard to his left lateral lower extremity ulcer. 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 He's not having any significant pain. 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 He has been tolerating the Santyl without complication and in general I feel like he's making good progress. 10/17/17 on evaluation today patient  appears to be doing very well in regard to his left lateral lower surety ulcer. He has been tolerating the dressing changes without complication. There does not appear to be any evidence of infection he's alternating the Santyl and the triple antibiotic ointment every other day this seems to be doing well for him. 11/03/17 on evaluation today patient appears to be doing very well in regard to his left lateral lower extremity ulcer. He is been tolerating the dressing changes without complication which is good news. Fortunately there does not appear to be any evidence of infection which is also great news. Overall is doing excellent they are starting to taper down on the prednisone is down to 40 mg at this point it also started topical clobetasol for him. 11/17/17 on evaluation today patient appears to be doing well in regard to his left lateral lower surety ulcer. He's been tolerating the dressing changes without complication. He does note that he is having no pain, no excessive drainage or discharge, and overall he feels like things are going about how he would expect and hope they would. Overall he seems to have no evidence of infection at this time in my opinion which is good news. 12/04/17-He is seen in follow-up  evaluation for right lateral lower extremity ulcer. He has been applying topical steroid cream. Today's measurement show slight increase in size. Over the next 2 weeks we will transition to every other day Santyl and steroid cream. He has been encouraged to monitor for changes and notify clinic with any concerns 12/15/17 on evaluation today patient's left lateral motion the ulcer and fortunately is doing worse again at this point. This just since last week to this week has close to doubled in size according to the patient. I did not seeing last week's I do not have a visual to compare this to in our system was also down so we do not have all the charts and at this point. Nonetheless it does have me somewhat concerned in regard to the fact that again he was worried enough about it he has contact the dermatology that placed them back on the full strength, 50 mg a day of the prednisone that he was taken previous. He continues to alternate using clobetasol along with Santyl at this point. He is obviously somewhat frustrated. 12/22/17 on evaluation today patient appears to be doing a little worse compared to last evaluation. Unfortunately the wound is a little deeper and slightly larger than the last week's evaluation. With that being said he has made some progress in regard to the irritation surrounding at this time unfortunately despite that progress that's been made he still has a significant issue going on here. 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 [name], [name] ([id number]) For that reason I discussed with him today the possibility of perform a wound culture to ensure there's nothing overtly infected. 01/06/18 on evaluation today patient's wound is larger and deeper than previously evaluated. With that being said it did appear that his wound was  infected after my last evaluation with him. Subsequently I did end up prescribing a prescription for Bactrim DS which she has been taking and having no complication with. Fortunately there does not appear to be any evidence of infection at this point in time as far as anything spreading, no want to touch, and overall I feel like  things are showing signs of improvement. 01/13/18 on evaluation today patient appears to be even a little larger and deeper than last time. There still muscle exposed in the base of the wound. Nonetheless he does appear to be less erythematous I do believe inflammation is calming down also believe the infection looks like it's probably resolved at this time based on what I'm seeing. No fevers, chills, nausea, or vomiting noted at this time. 01/30/18 on evaluation today patient actually appears to visually look better for the most part. Unfortunately those visually this looks better he does seem to potentially have what may be an abscess in the muscle that has been noted in the central portion of the wound. This is the first time that I have noted what appears to be fluctuance in the central portion of the muscle. With that being said I'm somewhat more concerned about the fact that this might indicate an abscess formation at this location. I do believe that an ultrasound would be appropriate. This is likely something we need to try to do as soon as possible. He has been switch to mupirocin ointment and he is no longer using the steroid ointment as prescribed by dermatology he sees them again next week he's been decreased from 60 to 40 mg of prednisone. 03/09/18 on evaluation today patient actually appears to be doing a little better compared to last time I saw him. There's not as much erythema surrounding the wound itself. He I did review his most recent infectious disease note which was dated 02/24/18. He saw Dr. Michel Bickers in [place]. 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 Patient is now off of antibiotics and they are continually observing this. There seems to be no change in the past two weeks in my pinion based on what the patient says and what I see today compared to what Dr. Megan Salon likely saw two weeks ago. No fevers, chills, nausea, or vomiting noted at this time. 03/23/18 on evaluation today patient's wound actually appears to be showing signs of improvement which is good news. He is currently still on the Dapsone. He is also working on tapering the prednisone to get off of this and Dr. Lottie Rater is working with him in this regard. Nonetheless overall I feel like the wound is doing well it does appear based on the infectious disease note that I reviewed from Dr. 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 I did also review the results of his ultrasound of left lower extremity which revealed there was a dentist tissue in the base of the wound without an abscess noted. 04/06/18 on evaluation today the patient's left lateral lower extremity ulcer actually appears to be doing fairly well which is excellent news. There does not appear to be any evidence of infection at this time which is also great news. Overall he still does have a significantly large ulceration although little by little he seems to be making progress. He is down to 10 mg a day of the prednisone. 04/20/18 on evaluation today patient actually appears to be doing excellent at this time in regard to his left lower extremity ulcer. He's making signs of good progress unfortunately this is taking much longer than we would really like to see but nonetheless he is  making progress. Fortunately there does not appear to be any evidence of infection at this time. No fevers, chills, nausea, or vomiting noted at  this time. The patient has not been using the Santyl due to the cost he hadn't got in this field yet. He's mainly been using the antibiotic ointment topically. Subsequently he also tells me that he really has not been scrubbing in the shower I think this would be helpful again as I told him it doesn't have to be anything too aggressive to even make it believe just enough to keep it free of some of the loose slough and biofilm on the wound surface. 05/11/18 on evaluation today patient's wound appears to be making slow but sure progress in regard to the left lateral lower extremity ulcer. He is been tolerating the dressing changes without complication. Fortunately there does not appear to be any evidence of infection at this time. He is still just using triple antibiotic ointment along with clobetasol occasionally over the area. He never got the Santyl and really does not seem to intend to in my pinion. 06/01/18 on evaluation today patient appears to be doing a little better in regard to his left lateral lower extremity ulcer. He states that overall he does not feel like he is doing as well with the Dapsone as he did with the prednisone. Nonetheless he sees his dermatologist later today and is gonna talk to them about the possibility of going back on the prednisone. Overall again I believe that the wound would be better if you would utilize Santyl but he really does not seem to be interested in going back to the [place] at this point. He has been using triple antibiotic ointment. 06/15/18 on evaluation today patient's wound actually appears to be doing about the same at this point. Fortunately there is no signs of infection at this time. He has made slight improvements although he continues to not really want to clean the wound bed at this point. He states that he just doesn't mess with it he doesn't want to cause any problems with everything else he has going on. He has been on medication, antibiotics  as prescribed by his dermatologist, for a staff infection of his lower extremities which is really drying out now and looking much better he tells me. Fortunately there is no sign of overall infection. 06/29/18 on evaluation today patient appears to be doing well in regard to his left lateral lower surety ulcer all things considering. Fortunately his staff infection seems to be greatly improved compared to previous. He has no signs of infection and this is drying up quite nicely. He is still the doxycycline for this is no longer on cental, Dapsone, or any of the other medications. His dermatologist has recommended possibility of an infusion but right now he does not want to proceed with that. 07/13/18 on evaluation today patient appears to be doing about the same in regard to his left lateral lower surety ulcer. Fortunately there's no signs of infection at this time which is great news. Unfortunately he still builds up a significant amount of Slough/biofilm of the surface of the wound he still is not really cleaning this as he should be appropriately. Again I'm able to easily with saline and gauze remove the majority of this on the surface which if you would do this at home would likely be a dramatic improvement for him as far as getting the area to improve. Nonetheless overall I still feel like he  is making progress is just very slow. I think Santyl will be of benefit for him as well. Still he has not gotten this as of this point. 07/27/18 on evaluation today patient actually appears to be doing little worse in regards of the erythema around the periwound region of the wound he also tells me that he's been having more drainage currently compared to what he was experiencing last time I saw him. He states not quite as bad as what he had because this was infected previously but nonetheless is still appears to be doing poorly. Fortunately there is no evidence of systemic infection at this point. The patient  tells me that he is not going to be able to afford the Santyl. He is still waiting to hear about the infusion therapy with his dermatologist. Apparently she wants an updated colonoscopy first. 08/10/18 on evaluation today patient appears to be doing better in regard to his left lateral lower extremity ulcer. Fortunately he is showing signs of improvement in this regard he's actually been approved for Remicade infusion's as well although this has not been scheduled as of yet. Fortunately there's no signs of active infection at this time in regard to the wound although he is having some issues with infection of the right lower extremity is been seen as dermatologist for this. Fortunately they are definitely still working with him trying to keep things under control. [name], [name] ([id number]) 09/07/18 on evaluation today patient is actually doing rather well in regard to his left lateral lower extremity ulcer. He notes these actually having some hair grow back on his extremity which is something he has not seen in years. He also tells me that the pain is really not giving them any trouble at this time which is also good news overall she is very pleased with the progress he's using a combination of the mupirocin along with the probate is all mixed. 09/21/18 on evaluation today patient actually appears to be doing fairly well all things considered in regard to his looks from the ulcer. He's been tolerating the dressing changes without complication. Fortunately there's no signs of active infection at this time which is good news he is still on all antibiotics or prevention of the staff infection. He has been on prednisone for time although he states it is gonna contact his dermatologist and see if she put them on a short course due to some irritation that he has going on currently. Fortunately there's no evidence of any overall worsening this is going very slow I think cental would be something that would be  helpful for him although he states that $50 for tube is quite expensive. He therefore is not willing to get that at this point. 10/06/18 on evaluation today patient actually appears to be doing decently well in regard to his left lateral leg ulcer. He's been tolerating the dressing changes without complication. Fortunately there's no signs of active infection at this time. Overall I'm actually rather pleased with the progress he's making although it's slow he doesn't show any signs of infection and he does seem to be making some improvement. I do believe that he may need a switch up and dressings to try to help this to heal more appropriately and quickly. 10/19/18 on evaluation today patient actually appears to be doing better in regard to his left lateral lower extremity ulcer. This is shown signs of having much less Slough buildup at this point due to the fact he has been using  the Santyl. Obviously this is very good news. The overall size of the wound is not dramatically smaller but again the appearance is. 11/02/18 on evaluation today patient actually appears to be doing quite well in regard to his lower [place] ulcer. A lot of the skin around the ulcer is actually somewhat irritating at this point this seems to be more due to the dressing causing irritation from the adhesive that anything else. Fortunately there is no signs of active infection at this time. 11/24/18 on evaluation today patient appears to be doing a little worse in regard to his overall appearance of his lower extremity ulcer. There's more erythema and warmth around the wound unfortunately. He is currently on doxycycline which he has been on for some time. With that being said I'm not sure that seems to be helping with what appears to possibly be an acute cellulitis with regard to his left lower extremity ulcer. No fevers, chills, nausea, or vomiting noted at this time. 12/08/18 on evaluation today patient's wounds actually appears to  be doing significantly better compared to his last evaluation. He has been using Santyl along with alternating tripling about appointment as well as the steroid cream seems to be doing quite well and the wound is showing signs of improvement which is excellent news. Fortunately there's no evidence of infection and in fact his culture came back negative with only normal skin flora noted. 12/21/2018 upon evaluation today patient actually appears to be doing excellent with regard to his ulcer. This is actually the best that I have seen it since have been helping to take care of him. It is both smaller as well as less slough noted on the surface of the wound and seems to be showing signs of good improvement with new skin growing from the edges. He has been using just the triamcinolone he does wonder if he can get a refill of that ointment today. 01/04/2019 upon evaluation today patient actually appears to be doing well with regard to his left lateral lower extremity ulcer. With that being said it does not appear to be that he is doing quite as well as last time as far as progression is concerned. There does not appear to be any signs of infection or significant irritation which is good news. With that being said I do believe that he may benefit from switching to a collagen based dressing based on how clean The wound appears. 01/18/2019 on evaluation today patient actually appears to be doing well with regard to his wound on the left lower extremity. He is not made a lot of progress compared to where we were previous but nonetheless does seem to be doing okay at this time which is good news. There is no signs of active infection which is also good news. My only concern currently is I do wish we can get him into utilizing the collagen dressing his insurance would not pay for the supplies that we ordered although it appears that he may be able to order this through his supply company that he typically utilizes.  This is [employer]. Nonetheless he did try to order it during the office visit today and it appears this did go through. We will see if he can get that it is a different brand but nonetheless he has collagen and I do think will be beneficial. 02/01/2019 on evaluation today patient actually appears to be doing a little worse today in regard to the overall size of his wounds. Fortunately there  is no signs of active infection at this time. That is visually. Nonetheless when this is happened before it was due to infection. For that reason were somewhat concerned about that this time as well. 02/08/2019 on evaluation today patient unfortunately appears to be doing slightly worse with regard to his wound upon evaluation today. Is measuring a little deeper and a little larger unfortunately. I am not really sure exactly what is causing this to enlarge he actually did see his dermatologist she is going to see about initiating Humira for him. Subsequently she also did do steroid injections into the wound itself in the periphery. Nonetheless still nonetheless he seems to be getting a little bit larger he is gone back to just using the steroid cream topically which I think is appropriate. I would say hold off on the collagen for the time being is definitely a good thing to do. Based on the culture results which we finally did get the final result back regarding it shows staph as the bacteria noted again that can be a normal skin bacteria based on the fact however he is having increased drainage and worsening of the wound measurement wise I would go ahead and place him on an antibiotic today I do believe for this. 02/15/2019 on evaluation today patient actually appears to be doing somewhat better in regard to his ulcer. There is no signs of worsening at this time I did review his culture results which showed evidence of Staphylococcus aureus but not MRSA. Again this could just be more related to the normal skin  bacteria although he states the drainage has slowed down quite a bit he may have had a mild infection not just colonization. And was much smaller and then since around10/04/2019 on evaluation today patient appears to be doing unfortunately worse as far as the size of the wound. I really feel like that this is steadily getting larger again it had been doing excellent right at the beginning of September we have seen a steady increase in the area of the wound it is almost 2-1/2 times the size it was on September 1. Obviously this is a bad trend this is not wanting to see. For that reason we went back to using just the topical triamcinolone cream which does seem to help with inflammation. I checked him for bacteria by way of culture and nothing showed positive there. I am considering giving him a short course of a tapering steroid Dosepak today to see if that is can be beneficial for him. The patient is in agreement with giving that a try. 03/08/2019 on evaluation today patient appears to be doing very well in comparison to last evaluation with regard to his lower extremity ulcer. This is showing signs of less inflammation and actually measuring slightly smaller compared to last time every other week over the past month and a half he has been measuring larger larger larger. Nonetheless I do believe that the issue has been inflammation the prednisone does seem to [facility], [name] ([id number]) have been beneficial for him which is good news. No fevers, chills, nausea, vomiting, or diarrhea. 03/22/2019 on evaluation today patient appears to be doing about the same with regard to his leg ulcer. He has been tolerating the dressing changes without complication. With that being said the wound seems to be mostly arrested at its current size but really is not making any progress except for when we prescribed the prednisone. He did show some signs of dropping as far as  the overall size of the wound during that interval  week. Nonetheless this is something he is not on long-term at this point and unfortunately I think he is getting need either this or else the Humira which his dermatologist has discussed try to get approval for. With that being said he will be seeing his dermatologist on the 11th of this month that is November. 04/19/2019 on evaluation today patient appears to be doing really about the same the wound is measuring slightly larger compared to last time I saw him. He has not been into the office since November 2 due to the fact that he unfortunately had Covid as that his entire family. He tells me that it was rough but they did pull-through and he seems to be doing much better. Fortunately there is no signs of active infection at this time. No fevers, chills, nausea, vomiting, or diarrhea. 05/10/2019 on evaluation today patient unfortunately appears to be doing significantly worse as compared to last time I saw him. He does tell me that he has had his first dose of Humira and actually is scheduled to get the next one in the upcoming week. With that being said he tells me also that in the past several days he has been having a lot of issues with green drainage she showed me a picture this is more blue-green in color. He is also been having issues with increased sloughy buildup and the wound does appear to be larger today. Obviously this is not the direction that we want everything to take based on the starting of his Humira. Nonetheless I think this is definitely a result of likely infection and to be honest I think this is probably Pseudomonas causing the infection based on what I am seeing. 05/24/2019 on evaluation today patient unfortunately appears to be doing significantly worse compared to his prior evaluation with me 2 weeks ago. I did review his culture results which showed that he does have Staph aureus as well as Pseudomonas noted on the culture. Nonetheless the Levaquin that I prescribed for him  does not appear to have been appropriate and in fact he tells me he is no longer experiencing the green drainage and discharge that he had at the last visit. Fortunately there is no signs of active infection at this time which is good news although the wound has significantly worsened it in fact is much deeper than it was previous. We have been utilizing up to this point triamcinolone ointment as the prescription topical of choice but at this time I really feel like that the wound is getting need to be packed in order to appropriately manage this due to the deeper nature of the wound. Therefore something along the lines of an alginate dressing may be more appropriate. 05/31/2019 upon inspection today patient's wound actually showed signs of doing poorly at this point. Unfortunately he just does not seem to be making any good progress despite what we have tried. He actually did go ahead and pick up the Cipro and start taking that as he was noticing more green drainage he had previously completed the Levaquin that I prescribed for him as well. Nonetheless he missed his appointment for the seventh last week on Wednesday with the wound care center and [facility] where his dermatologist referred him. Obviously I do think a second opinion would be helpful at this point especially in light of the fact that the patient seems to be doing so poorly despite the fact  that we have tried everything that I really know how at this point. The only thing that ever seems to have helped him in the past is when he was on high doses of continual steroids that did seem to make a difference for him. 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 06/07/2019 upon evaluation today patient unfortunately appears to be doing worse yet again with regard to his wound. In fact I  am starting to question whether or not he may have a fluid pocket in the muscle at this point based on the bulging and the soft appearance to the central portion of the muscle area. There is not anything draining from the muscle itself at this time which is good news but nonetheless the wound is expanding. I am not really seeing any results of the Humira as far as overall wound progression based on what I am seeing at this point. The patient has been referred for second opinion with regard to his wound to the [facility] wound care center by his dermatologist which I definitely am not in opposition to. Unfortunately we tried multiple dressings in the past including collagen, alginate, and at one point even Hydrofera Blue. With that being said he is never really used it for any significant amount of time due to the fact that he often complains of pain associated with these dressings and then will go back to either using the Santyl which she has done intermittently or more frequently the triamcinolone. He is also using his own compression stockings. We have wrapped him in the past but again that was something else that he really was not a big fan of. 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 He seemed to do much better when he was on the steroids although honestly I understand that the steroids are not necessarily the best medication to be on long-term obviously 06/14/2019 on evaluation today patient appears to be doing actually a little bit better with regard to the overall appearance with his leg. Unfortunately he does continue to have issues with what appears to be some fluid underneath  the muscle although he did see the wound specialty center at [facility] last week their main goals were to see about infusion therapy in place of the Humira as they feel like that is not quite strong enough. They also recommended that we continue with the treatment otherwise as we are they felt like that was appropriate and they are okay with him continuing to follow-up here with [place] in that regard. With that being said they are also sending him to the vein specialist there to see about vein stripping and if that would be of benefit for him. Subsequently they also did not really address whether or not an ultrasound of the muscle area to see if there is anything that needs to be addressed here would be appropriate or not. For that reason I discussed this with him last week I think we  may proceed down that road at this point. 06/21/2019 upon evaluation today patient's wound actually appears to be doing slightly better compared to previous evaluations. I do believe that he has made a difference with regard to the progression here with the use of oral steroids. Again in the past has been the only thing that is really calm things down. He does tell me that from [facility] is gotten a good news from there that there are no further vein stripping that is necessary at this point. I do not have that available for review today although the patient did relay this to me. He also did obtain and have the ultrasound of the wound completed which I did sign off on today. It does appear that there is no fluid collection under the muscle this is likely then just edematous tissue in general. That is also good news. Overall I still believe the inflammation is the main issue here. He did inquire about the possibility of a wound VAC again with the muscle protruding like it is I am not really sure whether the wound VAC is necessarily ideal or not. That is something we will have to consider although I do believe he may need compression  wrapping to try to help with edema control which could potentially be of benefit. 06/28/2019 on evaluation today patient appears to be doing slightly better measurement wise although this is not terribly smaller he least seems to be trending towards that direction. With that being said he still seems to have purulent drainage noted in the wound bed at this time. He has been on Levaquin followed by Cipro over the past month. Unfortunately he still seems to have some issues with active infection at this time. I did perform a culture last week in order to evaluate and see if indeed there was still anything going on. Subsequently the culture did come back showing Pseudomonas which is consistent with the drainage has been having which is blue-green in color. He also has had an odor that again was somewhat consistent with Pseudomonas as well. Long story short it appears that the culture showed an intermediate finding with regard to how well the Cipro will work for the Pseudomonas infection. Subsequently being that he does not seem to be clearing up and at best what we are doing is just keeping this at [place] I think he may need to see infectious disease to discuss IV antibiotic options. ABHIRAM, CRIADO ([id number]) 07/05/2019 upon evaluation today patient appears to be doing okay in regard to his leg ulcer. He has been tolerating the dressing changes at this point without complication. Fortunately there is no signs of active infection at this time which is good news. No fevers, chills, nausea, vomiting, or diarrhea. With that being said he does have an appointment with infectious disease tomorrow and his primary care on Wednesday. Again the reason for the infectious disease referral was due to the fact that he did not seem to be fully resolving with the use of oral antibiotics and therefore we were thinking that IV antibiotic therapy may be necessary secondary to the fact that there was an intermediate finding for  how effective the Cipro may be. Nonetheless again he has been having a lot of purulent and even green drainage. Fortunately right now that seems to have calmed down over the past week with the reinitiation of the oral antibiotic. Nonetheless we will see what Dr. Megan Salon has to say. 07/12/2019 upon evaluation today patient appears to be  doing about the same at this point in regard to his left lower extremity ulcer. Fortunately there is no signs of active infection at this time which is good news I do believe the Levaquin has been beneficial I did review Dr. Hale Bogus note and to be honest I agree that the patient's leg does appear to be doing better currently. What we found in the past as he does not seem to really completely resolve he will stop the antibiotic and then subsequently things will revert back to having issues with blue-green drainage, increased pain, and overall worsening in general. Obviously that is the reason I sent him back to infectious disease. 07/19/2019 upon evaluation today patient appears to be doing roughly the same in size there is really no dramatic improvement. He has started back on the Levaquin at this point and though he seems to be doing okay he did still have a lot of blue/green drainage noted on evaluation today unfortunately. I think that this is still indicative more likely of a Pseudomonas infection as previously noted and again he does see Dr. Megan Salon in just a couple of days. I do not know that were really able to effectively clear this with just oral antibiotics alone based on what I am seeing currently. Nonetheless we are still continue to try to manage as best we can with regard to the patient and his wound. I do think the wrap was helpful in decreasing the edema which is excellent news. No fevers, chills, nausea, vomiting, or diarrhea. 07/26/2019 upon evaluation today patient appears to be doing slightly better with regard to the overall appearance of the muscle  there is no dark discoloration centrally. Fortunately there is no signs of active infection at this time. No fevers, chills, nausea, vomiting, or diarrhea. Patient's wound bed currently the patient did have an appointment with Dr. Megan Salon at infectious disease last week. With that being said Dr. Megan Salon the patient states was still somewhat hesitant about put him on any IV antibiotics he wanted [place] to repeat cultures today and then see where things go going forward. He does look like Dr. Megan Salon because of some improvement the patient did have with the Levaquin wanted [place] to see about repeating cultures. If it indeed grows the Pseudomonas again then he recommended a possibility of considering a PICC line placement and IV antibiotic therapy. He plans to see the patient back in 1 to 2 weeks. 08/02/2019 upon evaluation today patient appears to be doing poorly with regard to his left lower extremity. We did get the results of his culture back it shows that he is still showing evidence of Pseudomonas which is consistent with the purulent/blue-green drainage that he has currently. Subsequently the culture also shows that he now is showing resistance to the oral fluoroquinolones which is unfortunate as that was really the only thing to treat the infection prior. I do believe that he is looking like this is going require IV antibiotic therapy to get this under control. Fortunately there is no signs of systemic infection at this time which is good news. The patient does see Dr. Megan Salon tomorrow. 08/09/2019 upon evaluation today patient appears to be doing better with regard to his left lower extremity ulcer in regard to the overall appearance. He is currently on IV antibiotic therapy. As ordered by Dr. Megan Salon. Currently the patient is on ceftazidime which she is going to take for the next 2 weeks and then follow-up for 4 to 5-week appointment with Dr. Megan Salon.  The patient started this this past Friday  symptoms have not for a total of 3 days currently in full. 08/16/2019 upon evaluation today patient's wound actually does show muscle in the base of the wound but in general does appear to be much better as far as the overall evidence of infection is concerned. In fact I feel like this is for the most part cleared up he still on the IV antibiotics he has not completed the full course yet but I think he is doing much better which is excellent news. 08/23/2019 upon evaluation today patient appears to be doing about the same with regard to his wound at this point. He tells me that he still has pain unfortunately. Fortunately there is no evidence of systemic infection at this time which is great news. There is significant muscle protrusion. 09/13/19 upon evaluation today patient appears to be doing about the same in regard to his leg unfortunately. He still has a lot of drainage coming from the ulceration there is still muscle exposed. With that being said the patient's last wound culture still showed an intermediate finding with regard to the Pseudomonas he still having the bluish/green drainage as well. Overall I do not know that the wound has completely cleared of infection at this point. Fortunately there is no signs of active infection systemically at this point which is good news. 09/20/2019 upon evaluation today patient's wound actually appears to be doing about the same based on what I am seeing currently. I do not see any signs of systemic infection he still does have evidence of some local infection and drainage. He did see Dr. Megan Salon last week and Dr. Megan Salon states that he probably does need a different IV antibiotic although he does not want to put him on this until the patient begins the Remicade infusion which is actually scheduled for about 10 days out from today on 13 May. Following that time Dr. Megan Salon is good to see him back and then will evaluate the feasibility of starting him on the  IV antibiotic therapy once again at that point. I do not disagree with this plan I do believe as Dr. Megan Salon stated in his note that I reviewed today that the patient's issue is multifactorial with the pyoderma being 1 aspect of this that were hoping the Remicade will be helpful for her. In the meantime I think the gentamicin is, helping to keep things under decent okay control in regard to the ulcer. 09/27/2019 upon evaluation today patient appears to be doing about the same with regard to his wound still there is a lot of muscle exposure though he does have some hyper granulation tissue noted around the edge and actually some granulation tissue starting to form over the muscle which is actually good news. Fortunately there is no evidence of active infection which is also good news. His pain is less at this point. 5/21; this is a patient I have not seen in a long time. 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 He has a large wound on the left lateral leg with protruding muscle. He comes in the clinic today showing the same area on his left medial ankle. He says there is been a spot there for some time although we have not previously defined this. Today he has a clearly defined area with slight amount of skin breakdown surrounded by raised areas with a purplish hue in color. This is not painful he says it is irritated.  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 He still has muscle protruding from the lateral portion of his left leg but fortunately the new area that they were concerned about at his last visit does not appear to have opened at this point. He is currently on Remicade infusions and seems to be doing better in my opinion in fact the wound itself seems to be overall much better. The purplish discoloration that he did have seems to have resolved  and I think that is a good sign that hopefully the Remicade is doing its job. He does have some biofilm noted over the surface of the wound. 11/01/2019 on evaluation today patient's wound actually appears to be doing excellent at this time. Fortunately there is no evidence of active infection and overall I feel like he is making great progress. The Remicade seems to be due excellent job in my opinion. DOLORES, MCGOVERN ([id number]) 11/08/19 evaluation today vision actually appears to be doing quite well with regard to his weight ulcer. He's been tolerating dressing changes without complication. Fortunately there is no evidence of infection. No fevers, chills, nausea, or vomiting noted at this time. Overall states that is having more itching than pain which is actually a good sign in my opinion. 12/13/2019 upon evaluation today patient appears to be doing well today with regard to his wound. He has been tolerating the dressing changes without complication. Fortunately there is no sign of active infection at this time. No fevers, chills, nausea, vomiting, or diarrhea. Overall I feel like the infusion therapy has been very beneficial for him. 01/06/2020 on evaluation today patient appears to be doing well with regard to his wound. This is measuring smaller and actually looks to be doing better. Fortunately there is no signs of active infection at this point. No fevers, chills, nausea, vomiting, or diarrhea. With that being said he does still have the blue-green drainage but this does not seem to be causing any significant issues currently. He has been using the gentamicin that does seem to be keeping things under decent control at this point. He goes later this morning for his next infusion therapy for the pyoderma which seems to also be very beneficial. 02/07/2020 on evaluation today patient appears to be doing about the same in regard to his wounds currently. Fortunately there is no signs of active infection  systemically he does still have evidence of local infection still using gentamicin. He also is showing some signs of improvement albeit slowly I do feel like we are making some progress here. 02/21/2020 upon evaluation today patient appears to be making some signs of improvement the wound is measuring a little bit smaller which is great news and overall I am very pleased with where he stands currently. He is going to be having infusion therapy treatment on the 15th of this month. Fortunately there is no signs of active infection at this time. 03/13/2020 I do believe patient's wound is actually showing some signs of improvement here which is great news. He has continue with the infusion therapy through rheumatology/dermatology at [facility]. That does seem to be beneficial. I still think he gets as much benefit from this as he did from the prednisone initially but nonetheless obviously this is less harsh on his body that the prednisone as far as they are concerned. 03/31/2020 on evaluation today patient's wound actually showing signs of some pretty good improvement in regard  to the overall appearance of the wound bed. There is still muscle exposed though he does have some epithelial growth around the edges of the wound. Fortunately there is no signs of active infection at this time. No fevers, chills, nausea, vomiting, or diarrhea. 04/24/2020 upon evaluation today patient appears to be doing about the same in regard to his leg ulcer. He has been tolerating the dressing changes without complication. Fortunately there is no signs of active infection at this time. No fevers, chills, nausea, vomiting, or diarrhea. With that being said he still has a lot of irritation from the bandaging around the edges of the wound. We did discuss today the possibility of a referral to plastic surgery. 05/22/2020 on evaluation today patient appears to be doing well with regard to his wounds all things considered. He has not been able  to get the Chantix apparently there is a recall nurse that I was unaware of put out by [employer] involuntarily. Nonetheless for now I am and I have to do some research into what may be the best option for him to help with quitting in regard to smoking and we discussed that today. 06/26/2020 upon evaluation today patient appears to be doing well with regard to his wound from the standpoint of infection I do not see any signs of infection at this point. With that being said unfortunately he is still continuing to have issues with muscle exposure and again he is not having a whole lot of new skin growth unfortunately. There does not appear to be any signs of active infection at this time. No fevers, chills, nausea, vomiting, or diarrhea. 07/10/2020 upon evaluation today patient appears to be doing a little bit more poorly currently compared to where he was previous. I am concerned currently about an active infection that may be getting worse especially in light of the increased size and tenderness of the wound bed. No fevers, chills, nausea, vomiting, or diarrhea. 07/24/2020 upon evaluation today patient appears to be doing poorly in regard to his leg ulcer. He has been tolerating the dressing changes without complication but unfortunately is having a lot of discomfort. 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 Subsequently I think he is going require possibly IV antibiotics to get this under control. I am very concerned about the severity of his infection and the amount of discomfort he is having. 07/31/2020 upon evaluation today patient appears to be doing about the same in regard to his leg wound. He did see Dr. Megan Salon and Dr. Megan Salon is actually going to start him on IV antibiotics. He goes for the PICC line tomorrow. With that being said there do not have that run for 2 weeks and then see how things are doing and depending on how he  is progressing they may extend that a little longer. Nonetheless I am glad this is getting ready to be in place and definitely feel it may help the patient. In the meantime is been using mainly triamcinolone to the wound bed has an anti-inflammatory. 08/07/2020 on evaluation today patient appears to be doing well with regard to his wound compared even last week. In the interim he has gotten the PICC line placed and overall this seems to be doing excellent. There does not appear to be any evidence of infection which is great news systemically although locally of course has had the infection this appears to be improving with the use of the antibiotics. 08/14/2020 upon  evaluation today patient's wound actually showing signs of excellent improvement. Overall the irritation has significantly improved the drainage is back down to more of a normal level and his pain is really pretty much nonexistent compared to what it was. Obviously I think that this is significantly improved secondary to the IV antibiotic therapy which has made all the difference in the world. Again he had a resistant form of Pseudomonas for which oral antibiotics just was not cutting it. Nonetheless I do think that still we need to consider the possibility of a surgical closure for this wound is been open so long and to be honest with muscle exposed I think this can be very hard to get this to close outside of this although definitely were still working to try to do what we can in that regard. 08/21/2020 upon evaluation today patient appears to be doing very well with regard to his wounds on the left lateral lower extremity/calf area. Fortunately there does not appear to be signs of active infection which is great news and overall very pleased with where things stand today. He is actually wrapping up his treatment with IV antibiotics tomorrow. After that we will see where things go from there. 08/28/2020 upon evaluation today patient appears  to be doing decently well with regard to his leg ulcer. There does not appear to be any signs of active infection which is great news and overall very pleased with where things stand today. No fevers, chills, nausea, vomiting, or diarrhea. 09/18/2020 upon evaluation today patient appears to be doing well with regard to his infection which I feel like is better. Unfortunately he is not doing as well with regard to the overall size of the wound which is not nearly as good at this point. I feel like that he may be having an issue here with the pyoderma being somewhat out of control. I think that he may benefit from potentially going back and talking to the dermatologist about QASIM, DIVELEY ([id number]) what to do from the pyoderma standpoint. I am not certain if the infusions are helping nearly as much is what the prednisone did in the past. 10/02/2020 upon evaluation today patient appears to be doing well with regard to his leg ulcer. He did go to the Psychiatric nurse. Unfortunately they feel like there is a 10% chance that most that he would be able to heal and that the skin graft would take. Obviously this has led him to not be able to go down that path as far as treatment is concerned. Nonetheless he does seem to be doing a little bit better with the prednisone that I gave him last time. I think that he may need to discuss with dermatology the possibility of long-term prednisone as that seems to be what is most helpful for him to be perfectly honest. I am not sure the Remicade is really doing the job. 10/17/2020 upon evaluation today patient appears to be doing a little better in regard to his wound. In fact the case has been since we did the prednisone on May 2 for him that we have noticed a little bit of improvement each time we have seen a size wise as well as appearance wise as well as pain wise. I think the prednisone has had a greater effect then the infusion therapy has to be perfectly honest. With  that being said the patient also feels significantly better compared to what he was previous. All of this is good news but  nonetheless I am still concerned about the fact that again we are really not set up to long-term manage him as far as prednisone is concerned. Obviously there are things that you need to be watched I completely understand the risk of prednisone usage as well. 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 With all that being said I do believe that we can give him another round of the prednisone which she is requesting today because of the improvement that he seen since we did that first round. 10/30/2020 upon evaluation today patient's wound actually is showing signs of doing quite well. There does not appear to be any evidence of infection which is great news and overall very pleased with where things stand today. No fevers, chills, nausea, vomiting, or diarrhea. He tells me that the prednisone still has seem to have helped he wonders if we can extend that for just a little bit longer. He did not have the appointment with a dermatologist although he did have an infusion appointment last Friday. That was at [facility]. With that being said he tells me he could not do both that as well as the appointment with the physician on the same day therefore that is can have to be rescheduled. I really want to see if there is anything they feel like that could be done differently to try to help this out as I am not really certain that the infusions are helping significantly here. 11/13/2020 upon evaluation today patient unfortunately appears to be doing somewhat poorly in regard to his wound I feel like this is actually worsening from the standpoint of the pyoderma spreading. I still feel like that he may need something different as far as trying to manage this going forward. Again we did the prednisone unfortunately his blood sugars are not doing so well  because of this. Nonetheless I believe that the patient likely needs to try topical steroid. We have done triamcinolone for a while I think going with something stronger such as clobetasol could be beneficial again this is not something I do lightly I discussed this with the patient that again this does not normally put underneath an occlusive dressing. Nonetheless I think a thin film as such could help with some of the stronger anti-inflammatory effects. We discussed this today. He would like to try to give this a trial for the next couple weeks. I definitely think that is something that we can do. Evaluate7/03/2021 and today patient's wound bed actually showed signs of doing really about the same. There was a little expansion of the size of the wound and that leading edge that we done looking out although the clobetasol does seem to have slowed this down a bit in my opinion. There is just 1 small area that still seems to be progressing based on what I see. Nonetheless I am concerned about the fact this does not seem to be improving if anything seems to be doing a little bit worse. I do not know that the infusions are really helping him much as next infusion is August 5 his appointment with dermatology is July 25. Either way I really think that we need to have a conversation potentially about this and I am actually going to see if I can talk with Dr. Lillia Carmel in order to see where things stand as well. 12/11/2020 upon evaluation today patient appears to be doing worse in regard to his leg ulcer. Unfortunately  I just do not think this is making the progress that I would like to see at this point. Honestly he does have an appointment with dermatology and this is in 2 days. I am wondering what they may have to offer to help with this. Right now what I am seeing is that he is continuing to show signs of worsening little by little. Obviously that is not great at all. Is the exact opposite of what we are  looking for. 12/18/2020 upon evaluation today patient appears to be doing a little better in regard to his wound. The dermatologist actually did do some steroid injections into the wound which does seem to have been beneficial in my opinion. That was on the 25th already this looks a little better to me than last time I saw him. With that being said we did do a culture and this did show that he has Staph aureus noted in abundance in the wound. With that being said I do think that getting him on an oral antibiotic would be appropriate as well. Also think we can compression wrap and this will make a difference as well. 12/28/2020 upon evaluation today patient's wound is actually showing signs of doing much better. I do believe the compression wrap is helping he has a lot of drainage but to be honest I think that the compression is helping to some degree in this regard as well as not draining through which is also good news. No fevers, chills, nausea, vomiting, or diarrhea. 01/04/2021 upon evaluation today patient appears to be doing well with regard to his wound. Overall things seem to be doing quite well. He did have a little bit of reaction to the CarboFlex Sorbact he will be using that any longer. With that being said he is controlled as far as the drainage is concerned overall and seems to be doing quite well. I do not see any signs of active infection at this time which is great news. No fevers, chills, nausea, vomiting, or diarrhea. 01/11/2021 upon evaluation today patient appears to be doing well with regard to his wounds. He has been tolerating the dressing changes without complication. Fortunately there does not appear to be any signs of active infection at this time which is great news. Overall I am extremely pleased with where we stand currently. No fevers, chills, nausea, vomiting, or diarrhea. Where using clobetasol in the wound bed he has a lot of new skin growth which is awesome as  well. 01/18/2021 upon evaluation today patient appears to be doing very well in regard to his leg ulcer. He has been tolerating the dressing changes without complication. Fortunately there does not appear to be any signs of active infection which is great news. In general I think that he is making excellent progress 01/25/2021 upon evaluation today patient appears to be doing well with regard to his wound on the leg. I am actually extremely pleased with where things stand today. There does not appear to be any signs of active infection which is great news and overall I think that we are definitely headed in the appropriate direction based on what I am seeing currently. There does not appear to be any signs of active infection also excellent news. 02/06/2021 upon evaluation today patient appears to be doing well with regard to his wound. Overall visually this is showing signs of significant improvement which is great news. I do not see any signs of active infection systemically which is great even locally  I do not think that we are seeing any major complications here. We did do fluorescence imaging with the [employer] today. The patient does have some odor and drainage noted and again this is something that I think would benefit him to probably come more frequently for nurse visits. 02/19/2021 upon evaluation today patient actually appears to be doing quite well in regard to his wound. He has been tolerating the dressing Mclear, Stacie ([id number]) changes without complication and overall I think that this is making excellent progress. I do not see any evidence of active infection at this point which is great news as well. No fevers, chills, nausea, vomiting, or diarrhea. 10/10; wound is made nice progress healthy granulation with a nice rim of epithelialization which seems to be expanding even from last week he has a deeper area in the inferior part of the more distal part of the wound with not quite as  healthy as surface. This area will need to be followed. Using clobetasol and Hydrofera Blue 03/05/2021 upon evaluation today patient appears to be doing very well in regard to his leg ulcer. He has been tolerating dressing changes without complication. Fortunately there does not appear to be any signs of active infection which is great news and overall I am extremely pleased with where we stand currently. 03/12/2021 upon evaluation today patient appears to be doing well with regard to his wound in fact this is extremely extremely good based on what we are seeing today there does not appear to be any signs of active infection and overall I think that he is doing awesome from the standpoint of healing in general. I am extremely pleased with how things seem to be progressing with regard to this pyoderma. Clobetasol has done wonders for him. I think the compression wrapping has also been of great benefit. Electronic Signature(s) Signed: 03/12/2021 9:18:30 AM By: Worthy Keeler PA-C Entered By: Worthy Keeler on 03/12/2021 09:18:30 [name], [name] ([id number]) -------------------------------------------------------------------------------- Physical Exam Details Patient Name: [name] Date of Service: 03/12/2021 8:00 AM Medical Record Number: [id number] Patient Account Number: [id number] Date of Birth/Sex: 27-Feb-1981 (40 y.o. M) Treating RN: Carlene Coria Primary Care Provider: [name] Other Clinician: Referring Provider: [name] Treating Provider/Extender: [name] Cliche in Treatment: 41 Constitutional Well-nourished and well-hydrated in no acute distress. Respiratory normal breathing without difficulty. Psychiatric this patient is able to make decisions and demonstrates good insight into disease process. Alert and Oriented x 3. pleasant and cooperative. Notes Upon inspection patient's wound bed actually showed signs of excellent granulation epithelization at this  point. Fortunately there does not appear to be any signs of active infection systemically which is great and overall I am extremely pleased with where we stand today. Overall I think that the wound is dramatically improved and hopefully will continue to make good progress. Electronic Signature(s) Signed: 03/12/2021 9:18:59 AM By: Worthy Keeler PA-C Entered By: Worthy Keeler on 03/12/2021 09:18:59 [name], [name] ([id number]) -------------------------------------------------------------------------------- Physician Orders Details Patient Name: [name] Date of Service: 03/12/2021 8:00 AM Medical Record Number: [id number] Patient Account Number: [id number] Date of Birth/Sex: 1981/02/03 (40 y.o. 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 o Nurse Visit as needed - twice a week Bathing/ Shower/ Hygiene o Clean wound with Normal Saline or wound cleanser. o Other: - Apply dakins soaked gauze on wound after cleansing wound IN OFFICE ONLY Edema Control - Lymphedema / Segmental Compressive Device / Other o Optional: One layer of unna paste to top of compression wrap (to act as an anchor). o Elevate, Exercise Daily and Avoid Standing for Long Periods of Time. o Elevate legs to the level of the heart and pump ankles as often as possible o Elevate leg(s) parallel to the  floor when sitting. Wound Treatment Wound #1 - Lower Leg Wound Laterality: Left, Lateral Cleanser: Wound Cleanser 3 x Per Week/30 Days Discharge Instructions: Wash your hands with soap and water. Remove old dressing, discard into plastic bag and place into trash. Cleanse the wound with Wound Cleanser prior to applying a clean dressing using gauze sponges, not tissues or cotton balls. Do not scrub or use excessive force. Pat dry using gauze sponges, not tissue or cotton balls. Peri-Wound Care: Moisturizing Lotion 3 x Per Week/30 Days Discharge Instructions: Suggestions: Theraderm, Eucerin, Cetaphil, or patient preference. Topical: Clobetasol Propionate ointment 0.05%, 60 (g) tube 3 x Per Week/30 Days Discharge Instructions: Apply to 7 o'clock depth Primary Dressing: Hydrofera Blue Ready Transfer Foam, 4x5 (in/in) 3 x Per Week/30 Days Discharge Instructions: Apply Hydrofera Blue Ready to wound bed as directed Secondary Dressing: Xtrasorb Medium 4x5 (in/in) 3 x Per Week/30 Days Discharge Instructions: Apply to wound as directed. Do not cut. Compression Wrap: Medichoice 4 layer Compression System, 35-40 mmHG (Generic) 3 x Per Week/30 Days Discharge Instructions: Apply multi-layer wrap as directed. Electronic Signature(s) Signed: 03/12/2021 4:31:19 PM By: Worthy Keeler PA-C Signed: 03/13/2021 4:18:25 PM By: Carlene Coria RN Entered By: Carlene Coria on 03/12/2021 09:05:13 [name], [name] ([id number]) -------------------------------------------------------------------------------- Problem List Details Patient Name: [name] Date of Service: 03/12/2021 8:00 AM Medical Record Number: [id number] Patient Account Number: [id number] Date of Birth/Sex: 1980/12/11 (40 y.o. M) Treating RN: Carlene Coria Primary Care Provider: [name] Other Clinician: Referring Provider: [name] Treating Provider/Extender: [name] Cliche in Treatment: 222 Active Problems ICD-10 Encounter Code  Description Active Date MDM Diagnosis L97.222 Non-pressure chronic ulcer of left calf with fat layer exposed 12/04/2016 No Yes L88 Pyoderma gangrenosum 03/26/2017 No Yes L97.321 Non-pressure chronic ulcer of left ankle limited to breakdown of skin 10/08/2019 No Yes I87.2 Venous insufficiency (chronic) (peripheral) 12/04/2016 No Yes L03.116 Cellulitis of left lower limb 05/24/2019 No Yes F17.208 Nicotine dependence, unspecified, with other nicotine-induced disorders 04/24/2020 No Yes Inactive Problems ICD-10 Code Description Active Date Inactive Date L97.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 M) Treating RN: Carlene Coria Primary Care Provider: [name] Other Clinician: Referring Provider: [name] Treating Provider/Extender: [name] Cliche in Treatment: 222 Subjective Chief Complaint Information obtained from Patient He is here in follow up evaluation for LLE pyoderma ulcer History of Present Illness (HPI) 12/04/16; 40 year old man who comes into the clinic today for review of a wound on the posterior  left calf. He tells me that is been there for about a year. He is not a diabetic he does smoke half a pack per day. He was seen in the ER on 11/20/16 felt to have cellulitis around the wound and was given clindamycin. An x-ray did not show osteomyelitis. The patient initially tells me that he has a milk allergy that sets off a pruritic itching rash on his lower legs which she scratches  incessantly and he thinks that's what may have set up the wound. He has been using various topical antibiotics and ointments without any effect. He works in a [employer] and is on his feet all day. He does not have a prior history of wounds however he does have the rash on both lower legs the right arm and the ventral aspect of his left arm. These are excoriations and clearly have had scratching however there are of macular looking areas on both legs including a substantial larger area on the right leg. This does not have an underlying open area. There is no blistering. The patient tells me that 2 years ago in [place] in response to the rash on his legs he saw a dermatologist who told him he had a condition which may be pyoderma gangrenosum although I may be putting words into his mouth. He seemed to recognize this. On further questioning he admits to a 5 year history of quiesced. ulcerative colitis. He is not in any treatment for this. He's had no recent travel 12/11/16; the patient arrives today with his wound and roughly the same condition we've been using silver alginate this is a deep punched out wound with some surrounding erythema but no tenderness. Biopsy I did did not show confirmed pyoderma gangrenosum suggested nonspecific inflammation and vasculitis but does not provide an actual description of what was seen by the pathologist. I'm really not able to understand this We have also received information from the patient's dermatologist in [place] notes from April 2016. This was a doctor Agarwal-antal. 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 He was prescribed topical steroid high potency under occlusion which helped but at this point the patient did not have a deep punched out wound. 12/18/16; the patient's wound is larger in terms of surface area however this surface looks better and there is less depth. The surrounding erythema also is better. The patient states that the wrap we  put on came off 2 days ago when he has been using his compression stockings. He we are in the process of getting a dermatology consult. 12/26/16 on evaluation today patient's left lower extremity wound shows evidence of infection with surrounding erythema noted. He has been tolerating the dressing changes but states that he has noted more discomfort. There is a larger area of erythema surrounding the wound. No fevers, chills, nausea, or vomiting noted at this time. With that being said the wound still does have slough covering the surface. He is not allergic to any medication that he is aware of at this point. In regard to his right lower extremity he had several regions that are erythematous and pruritic he wonders if there's anything we can do to help that. 01/02/17 I reviewed patient's wound culture which was obtained his visit last week. He was placed on doxycycline at that point. Unfortunately that does not appear to be an antibiotic that would likely help with the situation however the pseudomonas noted on culture is sensitive to Cipro. Also unfortunately patient's wound seems to  have a large compared to last week's evaluation. Not severely so but there are definitely increased measurements in general. He is continuing to have discomfort as well he writes this to be a seven out of 10. In fact he would prefer me not to perform any debridement today due to the fact that he is having discomfort and considering he has an active infection on the little reluctant to do so anyway. No fevers, chills, nausea, or vomiting noted at this time. 01/08/17; patient seems dermatology on September 5. I suspect dermatology will want the slides from the biopsy I did sent to their pathologist. I'm not sure if there is a way we can expedite that. In any case the culture I did before I left on vacation 3 weeks ago showed Pseudomonas he was given 10 days of Cipro and per her description of her intake nurses is actually  somewhat better this week although the wound is quite a bit bigger than I remember the last time I saw this. He still has 3 more days of Cipro 01/21/17; dermatology appointment tomorrow. He has completed the ciprofloxacin for Pseudomonas. Surface of the wound looks better however he is had some deterioration in the lesions on his right leg. Meantime the left lateral leg wound we will continue with sample 01/29/17; patient had his dermatology appointment but I can't yet see that note. He is completed his antibiotics. The wound is more superficial but considerably larger in circumferential area than when he came in. This is in his left lateral calf. He also has swollen erythematous areas with superficial wounds on the right leg and small papular areas on both arms. There apparently areas in her his upper thighs and buttocks I did not look at those. Dermatology biopsied the right leg. Hopefully will have their input next week. 02/05/17; patient went back to see his dermatologist who told him that he had a "scratching problem" as well as staph. He is now on a 30 day course of doxycycline and I believe she gave him triamcinolone cream to the right leg areas to help with the itching [not exactly sure but probably triamcinolone]. She apparently looked at the left lateral leg wound although this was not rebiopsied and I think felt to be ultimately part of the same pathogenesis. He is using sample border foam and changing nevus himself. He now has a new open area on the right posterior leg which was his biopsy site I don't have any of the dermatology notes 02/12/17; we put the patient in compression last week with SANTYL to the wound on the left leg and the biopsy. Edema is much better and the depth of the wound is now at level of skin. Area is still the same Biopsy site on the right lateral leg we've also been using santyl with a border foam dressing and he is changing this himself. 02/19/17; Using silver  alginate started last week to both the substantial left leg wound and the biopsy site on the right wound. He is tolerating compression well. Has a an appointment with his primary M.D. tomorrow wondering about diuretics although I'm wondering if the edema problem is actually lymphedema [name], [name] ([id number]) 02/26/17; the patient has been to see his primary doctor Dr. Jerrel Ivory at [facility] our primary care. She started him on Lasix 20 mg and this seems to have helped with the edema. However we are not making substantial change with the left lateral calf wound and inflammation. The biopsy site on the  right leg also looks stable but not really all that different. 03/12/17; the patient has been to see vein and vascular Dr. Lucky Cowboy. He has had venous reflux studies I have not reviewed these. I did get a call from his dermatology office. 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 They therefore recommended clobetasol and tetracycline i.e. 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 03/26/17; apparently the patient just had reflux studies not an appointment with Dr. dew. She arrives in clinic today having applied clobetasol for 2-3 weeks. He notes over the last 2-3 days excessive drainage having to change the dressing 3-4 times a day and also expanding erythema. 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 Lillia Carmel at [facility] dermatology locally who kindly saw him at my request. 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 My concern was the degree of erythema medially extending from his left leg wound which was either inflammation from pyoderma or cellulitis. I put him on Augmentin however culture of the wound showed Pseudomonas which is quinolone sensitive. I really don't believe he has cellulitis however in view of everything I will continue and give him a course of Cipro. He is also on doxycycline as an immune modulator for the pyoderma. In addition to his original wound on the left lateral leg with surrounding erythema he has a wound on the right posterior calf which was an original biopsy site done by dermatology. 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 Saw Dr. Lillia Carmel yesterday. He has been using topical antibiotics to both wound areas his original wound on the left and the biopsies/pathergy area on the right. There is definitely some improvement in the inflammation around the wound on the right although the patient states he has increasing sensitivity of the wounds. He is on prednisone 60 and doxycycline 1 as prescribed by Dr. Lillia Carmel. He is covering the topical antibiotic with gauze and putting this in his own compression stocks and changing this daily. He states that Dr. Lottie Rater did a culture of the left leg wound yesterday 05/07/17; pyoderma gangrenosum. The patient saw Dr. Lillia Carmel yesterday and has a follow-up with her in one month. He is still using topical antibiotics to both wounds although he can't recall exactly what type. He is still on prednisone 60 mg. Dr. Lillia Carmel stated that the doxycycline could stop if we were in agreement. 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 The right medial leg was induced by biopsy/pathergy. The  area on the right is essentially healed. 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 The right medial leg has closed and remains closed. He is still on prednisone 60. He tells me he missed his last dermatology appointment with Dr. Lillia Carmel but will make another  appointment. He reports that her blood sugar at a recent screen in [place] was high 200's. He was 180 today. 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 I think he is going to require still much longer prednisone perhaps another 3 months before attempting to taper. In the meantime his wound is a lot better. Smaller. He is cleaning this off daily and applying topical antibiotics. When he was last in the clinic I thought about changing to [facility] and actually put in a couple of calls to dermatology although probably not during their business hours. 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 He continues on oral prednisone. He has been using triple antibiotic ointment. There is surface debris and we will transition to [facility] and have him return in 2 weeks. He has lost 30 pounds since his last appointment with lifestyle modification. He may benefit from topical steroid cream for treatment this can be considered at a later date. 08/22/17 on evaluation today patient appears to actually be doing rather well in regard to his left lateral lower extremity ulcer. He has actually been managed by Dr. Dellia Nims most recently. Patient is currently on oral steroids at this time. This seems to have been of benefit for him. Nonetheless his last visit was actually with Leah on 08/06/17. Currently he is not utilizing any topical steroid creams although this could be of benefit as well. No fevers, chills, nausea, or vomiting noted at this  time. 09/05/17 on evaluation today patient appears to be doing better in regard to his left lateral lower extremity ulcer. He has been tolerating the dressing changes without complication. He is using Santyl with good effect. Overall I'm very pleased with how things are standing at this point. Patient likewise is happy that this is doing better. 09/19/17 on evaluation today patient actually appears to be doing rather well in regard to his left lateral lower extremity ulcer. 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 He's not having any significant pain. 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 He has been tolerating the Santyl without complication and in general I feel like he's making good progress. 10/17/17 on evaluation today patient appears to be doing very well in regard to his left lateral lower surety ulcer. He has been tolerating the dressing changes without complication. There does not appear to be any evidence of infection he's alternating the Santyl and the triple antibiotic ointment every other day this seems to be doing well for him. 11/03/17 on evaluation today patient appears to be doing very well in regard to his left lateral lower extremity ulcer. He is been tolerating the dressing changes without complication which is good news. Fortunately there does not appear to be any evidence of infection which is also great news. Overall is doing excellent they are starting to taper down on the prednisone is down to 40 mg at this point it also started topical clobetasol for him. 11/17/17 on evaluation today patient appears to be doing well in regard to his left lateral lower surety ulcer. He's been tolerating the dressing changes without complication. He does note that he is having no pain, no excessive drainage or  discharge, and overall he feels like things are going about how he would expect and hope they would. Overall he seems to have no evidence of infection at this time in my opinion which is good news. 12/04/17-He is seen in follow-up evaluation for right lateral lower extremity ulcer. He has been applying topical steroid cream. Today's measurement show slight increase in size. Over the next 2 weeks we will transition to every other day Santyl and steroid cream. He has been encouraged to monitor for changes and notify clinic with any concerns 12/15/17 on evaluation today patient's left lateral motion the ulcer and fortunately is doing worse again at this point. This just since last week to this week has close to doubled in size according to the patient. I did not seeing last week's I do not have a visual to compare this to in our system was also down so we do not have all the charts and at this point. Nonetheless it does have me somewhat concerned in regard to the fact that again he was worried enough about it he has contact the dermatology that placed them back on the full strength, 50 mg a day of the prednisone that he was taken previous. He continues to alternate using clobetasol along with Santyl at this point. He is obviously somewhat frustrated. ISAM, UNREIN ([id number]) 12/22/17 on evaluation today patient appears to be doing a little worse compared to last evaluation. Unfortunately the wound is a little deeper and slightly larger than the last week's evaluation. With that being said he has made some progress in regard to the irritation surrounding at this time unfortunately despite that progress that's been made he still has a significant issue going on here. 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 For that reason I discussed with him today the  possibility of perform a wound culture to ensure there's nothing overtly infected. 01/06/18 on evaluation today patient's wound is larger and deeper than previously evaluated. With that being said it did appear that his wound was infected after my last evaluation with him. Subsequently I did end up prescribing a prescription for Bactrim DS which she has been taking and having no complication with. Fortunately there does not appear to be any evidence of infection at this point in time as far as anything spreading, no want to touch, and overall I feel like things are showing signs of improvement. 01/13/18 on evaluation today patient appears to be even a little larger and deeper than last time. There still muscle exposed in the base of the wound. Nonetheless he does appear to be less erythematous I do believe inflammation is calming down also believe the infection looks like it's probably resolved at this time based on what I'm seeing. No fevers, chills, nausea, or vomiting noted at this time. 01/30/18 on evaluation today patient actually appears to visually look better for the most part. Unfortunately those visually this looks better he does seem to potentially have what may be an abscess in the muscle that has been noted in the central portion of the wound. This is the first time that I have noted what appears to be fluctuance in the central portion of the muscle. With that being said I'm somewhat more concerned about the fact that this might indicate an abscess formation at this location. I do believe that an ultrasound would be appropriate.  This is likely something we need to try to do as soon as possible. He has been switch to mupirocin ointment and he is no longer using the steroid ointment as prescribed by dermatology he sees them again next week he's been decreased from 60 to 40 mg of prednisone. 03/09/18 on evaluation today patient actually appears to be doing a little better compared to last time I  saw him. There's not as much erythema surrounding the wound itself. He I did review his most recent infectious disease note which was dated 02/24/18. He saw Dr. Michel Bickers in [place]. 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 Patient is now off of antibiotics and they are continually observing this. There seems to be no change in the past two weeks in my pinion based on what the patient says and what I see today compared to what Dr. Megan Salon likely saw two weeks ago. No fevers, chills, nausea, or vomiting noted at this time. 03/23/18 on evaluation today patient's wound actually appears to be showing signs of improvement which is good news. He is currently still on the Dapsone. He is also working on tapering the prednisone to get off of this and Dr. Lottie Rater is working with him in this regard. Nonetheless overall I feel like the wound is doing well it does appear based on the infectious disease note that I reviewed from Dr. 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 I did also review the results of his ultrasound of left lower extremity which revealed there was a dentist tissue in the base of the wound without an abscess noted. 04/06/18 on evaluation today the patient's left lateral lower extremity ulcer actually appears to be doing fairly well which is excellent news. There does not appear to be any evidence of infection at this time which is also great news. Overall he still does have a significantly large ulceration although little by little he seems to be making progress. He is down to 10 mg a day of the prednisone. 04/20/18 on evaluation today patient actually appears to be doing excellent at this time in regard to his left lower extremity ulcer. He's making signs of good progress  unfortunately this is taking much longer than we would really like to see but nonetheless he is making progress. Fortunately there does not appear to be any evidence of infection at this time. No fevers, chills, nausea, or vomiting noted at this time. The patient has not been using the Santyl due to the cost he hadn't got in this field yet. He's mainly been using the antibiotic ointment topically. Subsequently he also tells me that he really has not been scrubbing in the shower I think this would be helpful again as I told him it doesn't have to be anything too aggressive to even make it believe just enough to keep it free of some of the loose slough and biofilm on the wound surface. 05/11/18 on evaluation today patient's wound appears to be making slow but sure progress in regard to the left lateral lower extremity ulcer. He is been tolerating the dressing changes without complication. Fortunately there does not appear to be any evidence of infection at this time. He is still just using triple antibiotic ointment along with clobetasol occasionally over the area.  He never got the Santyl and really does not seem to intend to in my pinion. 06/01/18 on evaluation today patient appears to be doing a little better in regard to his left lateral lower extremity ulcer. He states that overall he does not feel like he is doing as well with the Dapsone as he did with the prednisone. Nonetheless he sees his dermatologist later today and is gonna talk to them about the possibility of going back on the prednisone. Overall again I believe that the wound would be better if you would utilize Santyl but he really does not seem to be interested in going back to the [place] at this point. He has been using triple antibiotic ointment. 06/15/18 on evaluation today patient's wound actually appears to be doing about the same at this point. Fortunately there is no signs of infection at this time. He has made slight improvements  although he continues to not really want to clean the wound bed at this point. He states that he just doesn't mess with it he doesn't want to cause any problems with everything else he has going on. He has been on medication, antibiotics as prescribed by his dermatologist, for a staff infection of his lower extremities which is really drying out now and looking much better he tells me. Fortunately there is no sign of overall infection. 06/29/18 on evaluation today patient appears to be doing well in regard to his left lateral lower surety ulcer all things considering. Fortunately his staff infection seems to be greatly improved compared to previous. He has no signs of infection and this is drying up quite nicely. He is still the doxycycline for this is no longer on cental, Dapsone, or any of the other medications. His dermatologist has recommended possibility of an infusion but right now he does not want to proceed with that. 07/13/18 on evaluation today patient appears to be doing about the same in regard to his left lateral lower surety ulcer. Fortunately there's no signs of infection at this time which is great news. Unfortunately he still builds up a significant amount of Slough/biofilm of the surface of the wound he still is not really cleaning this as he should be appropriately. Again I'm able to easily with saline and gauze remove the majority of this on the surface which if you would do this at home would likely be a dramatic improvement for him as far as getting the area to improve. Nonetheless overall I still feel like he is making progress is just very slow. I think Santyl will be of benefit for him as well. Still he has not gotten this as of this point. 07/27/18 on evaluation today patient actually appears to be doing little worse in regards of the erythema around the periwound region of the wound he also tells me that he's been having more drainage currently compared to what he was  experiencing last time I saw him. He states not quite as bad as what he had because this was infected previously but nonetheless is still appears to be doing poorly. Fortunately there is no evidence of systemic infection at this point. The patient tells me that he is not going to be able to afford the Santyl. He is still waiting to hear about the infusion therapy with his dermatologist. Apparently she wants an updated colonoscopy first. [name], [name] ([id number]) 08/10/18 on evaluation today patient appears to be doing better in regard to his left lateral lower extremity ulcer. Fortunately he is showing  signs of improvement in this regard he's actually been approved for Remicade infusion's as well although this has not been scheduled as of yet. Fortunately there's no signs of active infection at this time in regard to the wound although he is having some issues with infection of the right lower extremity is been seen as dermatologist for this. Fortunately they are definitely still working with him trying to keep things under control. 09/07/18 on evaluation today patient is actually doing rather well in regard to his left lateral lower extremity ulcer. He notes these actually having some hair grow back on his extremity which is something he has not seen in years. He also tells me that the pain is really not giving them any trouble at this time which is also good news overall she is very pleased with the progress he's using a combination of the mupirocin along with the probate is all mixed. 09/21/18 on evaluation today patient actually appears to be doing fairly well all things considered in regard to his looks from the ulcer. He's been tolerating the dressing changes without complication. Fortunately there's no signs of active infection at this time which is good news he is still on all antibiotics or prevention of the staff infection. He has been on prednisone for time although he states it is gonna  contact his dermatologist and see if she put them on a short course due to some irritation that he has going on currently. Fortunately there's no evidence of any overall worsening this is going very slow I think cental would be something that would be helpful for him although he states that $50 for tube is quite expensive. He therefore is not willing to get that at this point. 10/06/18 on evaluation today patient actually appears to be doing decently well in regard to his left lateral leg ulcer. He's been tolerating the dressing changes without complication. Fortunately there's no signs of active infection at this time. Overall I'm actually rather pleased with the progress he's making although it's slow he doesn't show any signs of infection and he does seem to be making some improvement. I do believe that he may need a switch up and dressings to try to help this to heal more appropriately and quickly. 10/19/18 on evaluation today patient actually appears to be doing better in regard to his left lateral lower extremity ulcer. This is shown signs of having much less Slough buildup at this point due to the fact he has been using the [employer]. Obviously this is very good news. The overall size of the wound is not dramatically smaller but again the appearance is. 11/02/18 on evaluation today patient actually appears to be doing quite well in regard to his lower [place] ulcer. A lot of the skin around the ulcer is actually somewhat irritating at this point this seems to be more due to the dressing causing irritation from the adhesive that anything else. Fortunately there is no signs of active infection at this time. 11/24/18 on evaluation today patient appears to be doing a little worse in regard to his overall appearance of his lower extremity ulcer. There's more erythema and warmth around the wound unfortunately. He is currently on doxycycline which he has been on for some time. With that being said I'm not  sure that seems to be helping with what appears to possibly be an acute cellulitis with regard to his left lower extremity ulcer. No fevers, chills, nausea, or vomiting noted at this time. 12/08/18 on  evaluation today patient's wounds actually appears to be doing significantly better compared to his last evaluation. He has been using Santyl along with alternating tripling about appointment as well as the steroid cream seems to be doing quite well and the wound is showing signs of improvement which is excellent news. Fortunately there's no evidence of infection and in fact his culture came back negative with only normal skin flora noted. 12/21/2018 upon evaluation today patient actually appears to be doing excellent with regard to his ulcer. This is actually the best that I have seen it since have been helping to take care of him. It is both smaller as well as less slough noted on the surface of the wound and seems to be showing signs of good improvement with new skin growing from the edges. He has been using just the triamcinolone he does wonder if he can get a refill of that ointment today. 01/04/2019 upon evaluation today patient actually appears to be doing well with regard to his left lateral lower extremity ulcer. With that being said it does not appear to be that he is doing quite as well as last time as far as progression is concerned. There does not appear to be any signs of infection or significant irritation which is good news. With that being said I do believe that he may benefit from switching to a collagen based dressing based on how clean The wound appears. 01/18/2019 on evaluation today patient actually appears to be doing well with regard to his wound on the left lower extremity. He is not made a lot of progress compared to where we were previous but nonetheless does seem to be doing okay at this time which is good news. There is no signs of active infection which is also good news. My  only concern currently is I do wish we can get him into utilizing the collagen dressing his insurance would not pay for the supplies that we ordered although it appears that he may be able to order this through his supply company that he typically utilizes. This is [employer]. Nonetheless he did try to order it during the office visit today and it appears this did go through. We will see if he can get that it is a different brand but nonetheless he has collagen and I do think will be beneficial. 02/01/2019 on evaluation today patient actually appears to be doing a little worse today in regard to the overall size of his wounds. Fortunately there is no signs of active infection at this time. That is visually. Nonetheless when this is happened before it was due to infection. For that reason were somewhat concerned about that this time as well. 02/08/2019 on evaluation today patient unfortunately appears to be doing slightly worse with regard to his wound upon evaluation today. Is measuring a little deeper and a little larger unfortunately. I am not really sure exactly what is causing this to enlarge he actually did see his dermatologist she is going to see about initiating Humira for him. Subsequently she also did do steroid injections into the wound itself in the periphery. Nonetheless still nonetheless he seems to be getting a little bit larger he is gone back to just using the steroid cream topically which I think is appropriate. I would say hold off on the collagen for the time being is definitely a good thing to do. Based on the culture results which we finally did get the final result back regarding it shows staph  as the bacteria noted again that can be a normal skin bacteria based on the fact however he is having increased drainage and worsening of the wound measurement wise I would go ahead and place him on an antibiotic today I do believe for this. 02/15/2019 on evaluation today patient actually  appears to be doing somewhat better in regard to his ulcer. There is no signs of worsening at this time I did review his culture results which showed evidence of Staphylococcus aureus but not MRSA. Again this could just be more related to the normal skin bacteria although he states the drainage has slowed down quite a bit he may have had a mild infection not just colonization. And was much smaller and then since around10/04/2019 on evaluation today patient appears to be doing unfortunately worse as far as the size of the wound. I really feel like that this is steadily getting larger again it had been doing excellent right at the beginning of September we have seen a steady increase in the area of the wound it is almost 2-1/2 times the size it was on September 1. Obviously this is a bad trend this is not wanting to see. For that reason we went back to using just the topical triamcinolone cream which does seem to help with inflammation. I checked him for bacteria by way of culture and nothing showed positive there. I am considering giving him a short course of a tapering steroid Salih Williamson, Burnett ([id number]) today to see if that is can be beneficial for him. The patient is in agreement with giving that a try. 03/08/2019 on evaluation today patient appears to be doing very well in comparison to last evaluation with regard to his lower extremity ulcer. This is showing signs of less inflammation and actually measuring slightly smaller compared to last time every other week over the past month and a half he has been measuring larger larger larger. Nonetheless I do believe that the issue has been inflammation the prednisone does seem to have been beneficial for him which is good news. No fevers, chills, nausea, vomiting, or diarrhea. 03/22/2019 on evaluation today patient appears to be doing about the same with regard to his leg ulcer. He has been tolerating the dressing changes without complication. With  that being said the wound seems to be mostly arrested at its current size but really is not making any progress except for when we prescribed the prednisone. He did show some signs of dropping as far as the overall size of the wound during that interval week. Nonetheless this is something he is not on long-term at this point and unfortunately I think he is getting need either this or else the Humira which his dermatologist has discussed try to get approval for. With that being said he will be seeing his dermatologist on the 11th of this month that is November. 04/19/2019 on evaluation today patient appears to be doing really about the same the wound is measuring slightly larger compared to last time I saw him. He has not been into the office since November 2 due to the fact that he unfortunately had Covid as that his entire family. He tells me that it was rough but they did pull-through and he seems to be doing much better. Fortunately there is no signs of active infection at this time. No fevers, chills, nausea, vomiting, or diarrhea. 05/10/2019 on evaluation today patient unfortunately appears to be doing significantly worse as compared to last time I saw  him. He does tell me that he has had his first dose of Humira and actually is scheduled to get the next one in the upcoming week. With that being said he tells me also that in the past several days he has been having a lot of issues with green drainage she showed me a picture this is more blue-green in color. He is also been having issues with increased sloughy buildup and the wound does appear to be larger today. Obviously this is not the direction that we want everything to take based on the starting of his Humira. Nonetheless I think this is definitely a result of likely infection and to be honest I think this is probably Pseudomonas causing the infection based on what I am seeing. 05/24/2019 on evaluation today patient unfortunately appears to be  doing significantly worse compared to his prior evaluation with me 2 weeks ago. I did review his culture results which showed that he does have Staph aureus as well as Pseudomonas noted on the culture. Nonetheless the Levaquin that I prescribed for him does not appear to have been appropriate and in fact he tells me he is no longer experiencing the green drainage and discharge that he had at the last visit. Fortunately there is no signs of active infection at this time which is good news although the wound has significantly worsened it in fact is much deeper than it was previous. We have been utilizing up to this point triamcinolone ointment as the prescription topical of choice but at this time I really feel like that the wound is getting need to be packed in order to appropriately manage this due to the deeper nature of the wound. Therefore something along the lines of an alginate dressing may be more appropriate. 05/31/2019 upon inspection today patient's wound actually showed signs of doing poorly at this point. Unfortunately he just does not seem to be making any good progress despite what we have tried. He actually did go ahead and pick up the Cipro and start taking that as he was noticing more green drainage he had previously completed the Levaquin that I prescribed for him as well. Nonetheless he missed his appointment for the seventh last week on Wednesday with the wound care center and [facility] where his dermatologist referred him. Obviously I do think a second opinion would be helpful at this point especially in light of the fact that the patient seems to be doing so poorly despite the fact that we have tried everything that I really know how at this point. The only thing that ever seems to have helped him in the past is when he was on high doses of continual steroids that did seem to make a difference for him. Right now he is on immune modulating medication to try to help with  the pyoderma but I am not sure that he is getting as much relief at this point as he is previously obtained from the use of steroids. 06/07/2019 upon evaluation today patient unfortunately appears to be doing worse yet again with regard to his wound. In fact I am starting to question whether or not he may have a fluid pocket in the muscle at this point based on the bulging and the soft appearance to the central portion of the muscle area. There is not anything draining from the muscle itself at this time which is good news but nonetheless the wound is expanding. I am not really seeing any results of  the Humira as far as overall wound progression based on what I am seeing at this point. The patient has been referred for second opinion with regard to his wound to the [name] wound care center by his dermatologist which I definitely am not in opposition to. Unfortunately we tried multiple dressings in the past including collagen, alginate, and at one point even Hydrofera Blue. With that being said he is never really used it for any significant amount of time due to the fact that he often complains of pain associated with these dressings and then will go back to either using the Santyl which she has done intermittently or more frequently the triamcinolone. He is also using his own compression stockings. We have wrapped him in the past but again that was something else that he really was not a big fan of. 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 He seemed to do much better when he was on the steroids although honestly I understand that the steroids are not necessarily the best medication to  be on long-term obviously 06/14/2019 on evaluation today patient appears to be doing actually a little bit better with regard to the overall appearance with his leg. Unfortunately he does continue to have issues with what appears to be some fluid underneath the muscle although he did see the wound specialty center at [facility] last week their main goals were to see about infusion therapy in place of the Humira as they feel like that is not quite strong enough. They also recommended that we continue with the treatment otherwise as we are they felt like that was appropriate and they are okay with him continuing to follow-up here with [place] in that regard. With that being said they are also sending him to the vein specialist there to see about vein stripping and if that would be of benefit for him. Subsequently they also did not really address whether or not an ultrasound of the muscle area to see if there is anything that needs to be addressed here would be appropriate or not. For that reason I discussed this with him last week I think we may proceed down that road at this point. 06/21/2019 upon evaluation today patient's wound actually appears to be doing slightly better compared to previous evaluations. I do believe that he has made a difference with regard to the progression here with the use of oral steroids. Again in the past has been the only thing that is really calm things down. He does tell me that from [facility] is gotten a good news from there that there are no further vein stripping that is necessary at this point. I do not have that available for review today although the patient did relay this to me. He also did obtain and have the ultrasound of the wound completed which I did sign off on today. It does appear that there is no fluid collection under the muscle this is likely then just edematous tissue in general. That is also good news. Overall I still believe the inflammation is the main issue here. He did  inquire about the possibility of a wound VAC  again with the muscle protruding like it is I am not really sure whether the wound VAC is necessarily ideal or not. That is something we will have to consider although I do believe he may need compression wrapping to try to help with edema control which could potentially be of benefit. 06/28/2019 on evaluation today patient appears to be doing slightly better measurement wise although this is not terribly smaller he least seems to be trending towards that direction. With that being said he still seems to have purulent drainage noted in the wound bed at this time. He has been on Levaquin followed by Cipro over the past month. Unfortunately he still seems to have some issues with active infection at this time. I did perform a culture last week in order to evaluate and see if indeed there was still anything going on. Subsequently the culture did come back Shannahan, Donnavin ([id number]) showing Pseudomonas which is consistent with the drainage has been having which is blue-green in color. He also has had an odor that again was somewhat consistent with Pseudomonas as well. Long story short it appears that the culture showed an intermediate finding with regard to how well the Cipro will work for the Pseudomonas infection. Subsequently being that he does not seem to be clearing up and at best what we are doing is just keeping this at [place] I think he may need to see infectious disease to discuss IV antibiotic options. 07/05/2019 upon evaluation today patient appears to be doing okay in regard to his leg ulcer. He has been tolerating the dressing changes at this point without complication. Fortunately there is no signs of active infection at this time which is good news. No fevers, chills, nausea, vomiting, or diarrhea. With that being said he does have an appointment with infectious disease tomorrow and his primary care on Wednesday. Again the reason for the infectious  disease referral was due to the fact that he did not seem to be fully resolving with the use of oral antibiotics and therefore we were thinking that IV antibiotic therapy may be necessary secondary to the fact that there was an intermediate finding for how effective the Cipro may be. Nonetheless again he has been having a lot of purulent and even green drainage. Fortunately right now that seems to have calmed down over the past week with the reinitiation of the oral antibiotic. Nonetheless we will see what Dr. Megan Salon has to say. 07/12/2019 upon evaluation today patient appears to be doing about the same at this point in regard to his left lower extremity ulcer. Fortunately there is no signs of active infection at this time which is good news I do believe the Levaquin has been beneficial I did review Dr. Hale Bogus note and to be honest I agree that the patient's leg does appear to be doing better currently. What we found in the past as he does not seem to really completely resolve he will stop the antibiotic and then subsequently things will revert back to having issues with blue-green drainage, increased pain, and overall worsening in general. Obviously that is the reason I sent him back to infectious disease. 07/19/2019 upon evaluation today patient appears to be doing roughly the same in size there is really no dramatic improvement. He has started back on the Levaquin at this point and though he seems to be doing okay he did still have a lot of blue/green drainage noted on evaluation today unfortunately. I think that this is still indicative  more likely of a Pseudomonas infection as previously noted and again he does see Dr. Megan Salon in just a couple of days. I do not know that were really able to effectively clear this with just oral antibiotics alone based on what I am seeing currently. Nonetheless we are still continue to try to manage as best we can with regard to the patient and his wound. I do  think the wrap was helpful in decreasing the edema which is excellent news. No fevers, chills, nausea, vomiting, or diarrhea. 07/26/2019 upon evaluation today patient appears to be doing slightly better with regard to the overall appearance of the muscle there is no dark discoloration centrally. Fortunately there is no signs of active infection at this time. No fevers, chills, nausea, vomiting, or diarrhea. Patient's wound bed currently the patient did have an appointment with Dr. Megan Salon at infectious disease last week. With that being said Dr. Megan Salon the patient states was still somewhat hesitant about put him on any IV antibiotics he wanted [place] to repeat cultures today and then see where things go going forward. He does look like Dr. Megan Salon because of some improvement the patient did have with the Levaquin wanted [place] to see about repeating cultures. If it indeed grows the Pseudomonas again then he recommended a possibility of considering a PICC line placement and IV antibiotic therapy. He plans to see the patient back in 1 to 2 weeks. 08/02/2019 upon evaluation today patient appears to be doing poorly with regard to his left lower extremity. We did get the results of his culture back it shows that he is still showing evidence of Pseudomonas which is consistent with the purulent/blue-green drainage that he has currently. Subsequently the culture also shows that he now is showing resistance to the oral fluoroquinolones which is unfortunate as that was really the only thing to treat the infection prior. I do believe that he is looking like this is going require IV antibiotic therapy to get this under control. Fortunately there is no signs of systemic infection at this time which is good news. The patient does see Dr. Megan Salon tomorrow. 08/09/2019 upon evaluation today patient appears to be doing better with regard to his left lower extremity ulcer in regard to the overall appearance. He is currently on  IV antibiotic therapy. As ordered by Dr. Megan Salon. Currently the patient is on ceftazidime which she is going to take for the next 2 weeks and then follow-up for 4 to 5-week appointment with Dr. Megan Salon. The patient started this this past Friday symptoms have not for a total of 3 days currently in full. 08/16/2019 upon evaluation today patient's wound actually does show muscle in the base of the wound but in general does appear to be much better as far as the overall evidence of infection is concerned. In fact I feel like this is for the most part cleared up he still on the IV antibiotics he has not completed the full course yet but I think he is doing much better which is excellent news. 08/23/2019 upon evaluation today patient appears to be doing about the same with regard to his wound at this point. He tells me that he still has pain unfortunately. Fortunately there is no evidence of systemic infection at this time which is great news. There is significant muscle protrusion. 09/13/19 upon evaluation today patient appears to be doing about the same in regard to his leg unfortunately. He still has a lot of drainage coming from the ulceration  there is still muscle exposed. With that being said the patient's last wound culture still showed an intermediate finding with regard to the Pseudomonas he still having the bluish/green drainage as well. Overall I do not know that the wound has completely cleared of infection at this point. Fortunately there is no signs of active infection systemically at this point which is good news. 09/20/2019 upon evaluation today patient's wound actually appears to be doing about the same based on what I am seeing currently. I do not see any signs of systemic infection he still does have evidence of some local infection and drainage. He did see Dr. Megan Salon last week and Dr. Megan Salon states that he probably does need a different IV antibiotic although he does not want to put him  on this until the patient begins the Remicade infusion which is actually scheduled for about 10 days out from today on 13 May. Following that time Dr. Megan Salon is good to see him back and then will evaluate the feasibility of starting him on the IV antibiotic therapy once again at that point. I do not disagree with this plan I do believe as Dr. Megan Salon stated in his note that I reviewed today that the patient's issue is multifactorial with the pyoderma being 1 aspect of this that were hoping the Remicade will be helpful for her. In the meantime I think the gentamicin is, helping to keep things under decent okay control in regard to the ulcer. 09/27/2019 upon evaluation today patient appears to be doing about the same with regard to his wound still there is a lot of muscle exposure though he does have some hyper granulation tissue noted around the edge and actually some granulation tissue starting to form over the muscle which is actually good news. Fortunately there is no evidence of active infection which is also good news. His pain is less at this point. 5/21; this is a patient I have not seen in a long time. 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 He has a large wound on the left lateral leg with protruding muscle. He comes in the clinic today showing the same area on his left medial ankle. He says there is been a spot there for some time although we have not previously defined this. Today he has a clearly defined area with slight amount of skin breakdown surrounded by raised areas with a purplish hue in color. This is not painful he says it is irritated. 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 He still has muscle protruding from the lateral portion of his left leg but fortunately the new area that they were concerned about at his last visit does  not appear to have opened at this point. He is currently on Remicade infusions and seems to be doing better in my opinion in fact the wound itself seems to be overall much better. The purplish discoloration that he did have seems to have resolved and I think that is a good sign that hopefully the Remicade is doing its job. He does [name], Akira ([id number]) have some biofilm noted over the surface of the wound. 11/01/2019 on evaluation today patient's wound actually appears to be doing excellent at this time. Fortunately there is no evidence of active infection and overall I feel like he is making great progress. The Remicade seems to be due excellent job in my opinion. 11/08/19  evaluation today vision actually appears to be doing quite well with regard to his weight ulcer. He's been tolerating dressing changes without complication. Fortunately there is no evidence of infection. No fevers, chills, nausea, or vomiting noted at this time. Overall states that is having more itching than pain which is actually a good sign in my opinion. 12/13/2019 upon evaluation today patient appears to be doing well today with regard to his wound. He has been tolerating the dressing changes without complication. Fortunately there is no sign of active infection at this time. No fevers, chills, nausea, vomiting, or diarrhea. Overall I feel like the infusion therapy has been very beneficial for him. 01/06/2020 on evaluation today patient appears to be doing well with regard to his wound. This is measuring smaller and actually looks to be doing better. Fortunately there is no signs of active infection at this point. No fevers, chills, nausea, vomiting, or diarrhea. With that being said he does still have the blue-green drainage but this does not seem to be causing any significant issues currently. He has been using the gentamicin that does seem to be keeping things under decent control at this point. He goes later this morning  for his next infusion therapy for the pyoderma which seems to also be very beneficial. 02/07/2020 on evaluation today patient appears to be doing about the same in regard to his wounds currently. Fortunately there is no signs of active infection systemically he does still have evidence of local infection still using gentamicin. He also is showing some signs of improvement albeit slowly I do feel like we are making some progress here. 02/21/2020 upon evaluation today patient appears to be making some signs of improvement the wound is measuring a little bit smaller which is great news and overall I am very pleased with where he stands currently. He is going to be having infusion therapy treatment on the 15th of this month. Fortunately there is no signs of active infection at this time. 03/13/2020 I do believe patient's wound is actually showing some signs of improvement here which is great news. He has continue with the infusion therapy through rheumatology/dermatology at [facility]. That does seem to be beneficial. I still think he gets as much benefit from this as he did from the prednisone initially but nonetheless obviously this is less harsh on his body that the prednisone as far as they are concerned. 03/31/2020 on evaluation today patient's wound actually showing signs of some pretty good improvement in regard to the overall appearance of the wound bed. There is still muscle exposed though he does have some epithelial growth around the edges of the wound. Fortunately there is no signs of active infection at this time. No fevers, chills, nausea, vomiting, or diarrhea. 04/24/2020 upon evaluation today patient appears to be doing about the same in regard to his leg ulcer. He has been tolerating the dressing changes without complication. Fortunately there is no signs of active infection at this time. No fevers, chills, nausea, vomiting, or diarrhea. With that being said he still has a lot of irritation from  the bandaging around the edges of the wound. We did discuss today the possibility of a referral to plastic surgery. 05/22/2020 on evaluation today patient appears to be doing well with regard to his wounds all things considered. He has not been able to get the Chantix apparently there is a recall nurse that I was unaware of put out by [employer] involuntarily. Nonetheless for now I am and I  have to do some research into what may be the best option for him to help with quitting in regard to smoking and we discussed that today. 06/26/2020 upon evaluation today patient appears to be doing well with regard to his wound from the standpoint of infection I do not see any signs of infection at this point. With that being said unfortunately he is still continuing to have issues with muscle exposure and again he is not having a whole lot of new skin growth unfortunately. There does not appear to be any signs of active infection at this time. No fevers, chills, nausea, vomiting, or diarrhea. 07/10/2020 upon evaluation today patient appears to be doing a little bit more poorly currently compared to where he was previous. I am concerned currently about an active infection that may be getting worse especially in light of the increased size and tenderness of the wound bed. No fevers, chills, nausea, vomiting, or diarrhea. 07/24/2020 upon evaluation today patient appears to be doing poorly in regard to his leg ulcer. He has been tolerating the dressing changes without complication but unfortunately is having a lot of discomfort. 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 Subsequently I think he is going require possibly IV antibiotics to get this under control. I am very concerned about the severity of his infection and the amount of discomfort he is having. 07/31/2020 upon evaluation today patient appears to be doing about the same in regard to his leg  wound. He did see Dr. Megan Salon and Dr. Megan Salon is actually going to start him on IV antibiotics. He goes for the PICC line tomorrow. With that being said there do not have that run for 2 weeks and then see how things are doing and depending on how he is progressing they may extend that a little longer. Nonetheless I am glad this is getting ready to be in place and definitely feel it may help the patient. In the meantime is been using mainly triamcinolone to the wound bed has an anti-inflammatory. 08/07/2020 on evaluation today patient appears to be doing well with regard to his wound compared even last week. In the interim he has gotten the PICC line placed and overall this seems to be doing excellent. There does not appear to be any evidence of infection which is great news systemically although locally of course has had the infection this appears to be improving with the use of the antibiotics. 08/14/2020 upon evaluation today patient's wound actually showing signs of excellent improvement. Overall the irritation has significantly improved the drainage is back down to more of a normal level and his pain is really pretty much nonexistent compared to what it was. Obviously I think that this is significantly improved secondary to the IV antibiotic therapy which has made all the difference in the world. Again he had a resistant form of Pseudomonas for which oral antibiotics just was not cutting it. Nonetheless I do think that still we need to consider the possibility of a surgical closure for this wound is been open so long and to be honest with muscle exposed I think this can be very hard to get this to close outside of this although definitely were still working to try to do what we can in that regard. 08/21/2020 upon evaluation today patient appears to be doing very well with regard to his wounds on the left lateral lower extremity/calf area. Fortunately there does not appear to be signs  of active  infection which is great news and overall very pleased with where things stand today. He is actually wrapping up his treatment with IV antibiotics tomorrow. After that we will see where things go from there. 08/28/2020 upon evaluation today patient appears to be doing decently well with regard to his leg ulcer. There does not appear to be any signs of Meisinger, Erique ([id number]) active infection which is great news and overall very pleased with where things stand today. No fevers, chills, nausea, vomiting, or diarrhea. 09/18/2020 upon evaluation today patient appears to be doing well with regard to his infection which I feel like is better. Unfortunately he is not doing as well with regard to the overall size of the wound which is not nearly as good at this point. I feel like that he may be having an issue here with the pyoderma being somewhat out of control. I think that he may benefit from potentially going back and talking to the dermatologist about what to do from the pyoderma standpoint. I am not certain if the infusions are helping nearly as much is what the prednisone did in the past. 10/02/2020 upon evaluation today patient appears to be doing well with regard to his leg ulcer. He did go to the Psychiatric nurse. Unfortunately they feel like there is a 10% chance that most that he would be able to heal and that the skin graft would take. Obviously this has led him to not be able to go down that path as far as treatment is concerned. Nonetheless he does seem to be doing a little bit better with the prednisone that I gave him last time. I think that he may need to discuss with dermatology the possibility of long-term prednisone as that seems to be what is most helpful for him to be perfectly honest. I am not sure the Remicade is really doing the job. 10/17/2020 upon evaluation today patient appears to be doing a little better in regard to his wound. In fact the case has been since we did the prednisone  on May 2 for him that we have noticed a little bit of improvement each time we have seen a size wise as well as appearance wise as well as pain wise. I think the prednisone has had a greater effect then the infusion therapy has to be perfectly honest. With that being said the patient also feels significantly better compared to what he was previous. All of this is good news but nonetheless I am still concerned about the fact that again we are really not set up to long-term manage him as far as prednisone is concerned. Obviously there are things that you need to be watched I completely understand the risk of prednisone usage as well. 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 With all that being said I do believe that we can give him another round of the prednisone which she is requesting today because of the improvement that he seen since we did that first round. 10/30/2020 upon evaluation today patient's wound actually is showing signs of doing quite well. There does not appear to be any evidence of infection which is great news and overall very pleased with where things stand today. No fevers, chills, nausea, vomiting, or diarrhea. He tells me that the prednisone still has seem to have helped he wonders if we can extend that for just a little bit longer. He did not have  the appointment with a dermatologist although he did have an infusion appointment last Friday. That was at [facility]. With that being said he tells me he could not do both that as well as the appointment with the physician on the same day therefore that is can have to be rescheduled. I really want to see if there is anything they feel like that could be done differently to try to help this out as I am not really certain that the infusions are helping significantly here. 11/13/2020 upon evaluation today patient unfortunately appears to be doing somewhat poorly in regard to his wound I feel like this  is actually worsening from the standpoint of the pyoderma spreading. I still feel like that he may need something different as far as trying to manage this going forward. Again we did the prednisone unfortunately his blood sugars are not doing so well because of this. Nonetheless I believe that the patient likely needs to try topical steroid. We have done triamcinolone for a while I think going with something stronger such as clobetasol could be beneficial again this is not something I do lightly I discussed this with the patient that again this does not normally put underneath an occlusive dressing. Nonetheless I think a thin film as such could help with some of the stronger anti-inflammatory effects. We discussed this today. He would like to try to give this a trial for the next couple weeks. I definitely think that is something that we can do. Evaluate7/03/2021 and today patient's wound bed actually showed signs of doing really about the same. There was a little expansion of the size of the wound and that leading edge that we done looking out although the clobetasol does seem to have slowed this down a bit in my opinion. There is just 1 small area that still seems to be progressing based on what I see. Nonetheless I am concerned about the fact this does not seem to be improving if anything seems to be doing a little bit worse. I do not know that the infusions are really helping him much as next infusion is August 5 his appointment with dermatology is July 25. Either way I really think that we need to have a conversation potentially about this and I am actually going to see if I can talk with Dr. Lillia Carmel in order to see where things stand as well. 12/11/2020 upon evaluation today patient appears to be doing worse in regard to his leg ulcer. Unfortunately I just do not think this is making the progress that I would like to see at this point. Honestly he does have an appointment with dermatology and  this is in 2 days. I am wondering what they may have to offer to help with this. Right now what I am seeing is that he is continuing to show signs of worsening little by little. Obviously that is not great at all. Is the exact opposite of what we are looking for. 12/18/2020 upon evaluation today patient appears to be doing a little better in regard to his wound. The dermatologist actually did do some steroid injections into the wound which does seem to have been beneficial in my opinion. That was on the 25th already this looks a little better to me than last time I saw him. With that being said we did do a culture and this did show that he has Staph aureus noted in abundance in the wound. With that being said I do think that  getting him on an oral antibiotic would be appropriate as well. Also think we can compression wrap and this will make a difference as well. 12/28/2020 upon evaluation today patient's wound is actually showing signs of doing much better. I do believe the compression wrap is helping he has a lot of drainage but to be honest I think that the compression is helping to some degree in this regard as well as not draining through which is also good news. No fevers, chills, nausea, vomiting, or diarrhea. 01/04/2021 upon evaluation today patient appears to be doing well with regard to his wound. Overall things seem to be doing quite well. He did have a little bit of reaction to the CarboFlex Sorbact he will be using that any longer. With that being said he is controlled as far as the drainage is concerned overall and seems to be doing quite well. I do not see any signs of active infection at this time which is great news. No fevers, chills, nausea, vomiting, or diarrhea. 01/11/2021 upon evaluation today patient appears to be doing well with regard to his wounds. He has been tolerating the dressing changes without complication. Fortunately there does not appear to be any signs of active  infection at this time which is great news. Overall I am extremely pleased with where we stand currently. No fevers, chills, nausea, vomiting, or diarrhea. Where using clobetasol in the wound bed he has a lot of new skin growth which is awesome as well. 01/18/2021 upon evaluation today patient appears to be doing very well in regard to his leg ulcer. He has been tolerating the dressing changes without complication. Fortunately there does not appear to be any signs of active infection which is great news. In general I think that he is making excellent progress 01/25/2021 upon evaluation today patient appears to be doing well with regard to his wound on the leg. I am actually extremely pleased with where things stand today. There does not appear to be any signs of active infection which is great news and overall I think that we are definitely headed in the appropriate direction based on what I am seeing currently. There does not appear to be any signs of active infection also excellent news. 02/06/2021 upon evaluation today patient appears to be doing well with regard to his wound. Overall visually this is showing signs of significant Kinkead, Davie ([id number]) improvement which is great news. I do not see any signs of active infection systemically which is great even locally I do not think that we are seeing any major complications here. We did do fluorescence imaging with the [employer] today. The patient does have some odor and drainage noted and again this is something that I think would benefit him to probably come more frequently for nurse visits. 02/19/2021 upon evaluation today patient actually appears to be doing quite well in regard to his wound. He has been tolerating the dressing changes without complication and overall I think that this is making excellent progress. I do not see any evidence of active infection at this point which is great news as well. No fevers, chills, nausea, vomiting, or  diarrhea. 10/10; wound is made nice progress healthy granulation with a nice rim of epithelialization which seems to be expanding even from last week he has a deeper area in the inferior part of the more distal part of the wound with not quite as healthy as surface. This area will need to be followed. Using clobetasol and  Hydrofera Blue 03/05/2021 upon evaluation today patient appears to be doing very well in regard to his leg ulcer. He has been tolerating dressing changes without complication. Fortunately there does not appear to be any signs of active infection which is great news and overall I am extremely pleased with where we stand currently. 03/12/2021 upon evaluation today patient appears to be doing well with regard to his wound in fact this is extremely extremely good based on what we are seeing today there does not appear to be any signs of active infection and overall I think that he is doing awesome from the standpoint of healing in general. I am extremely pleased with how things seem to be progressing with regard to this pyoderma. Clobetasol has done wonders for him. I think the compression wrapping has also been of great benefit. Objective Constitutional Well-nourished and well-hydrated in no acute distress. Vitals Time Taken: 8:13 AM, Height: 71 in, Weight: 338 lbs, BMI: 47.1, Temperature: 98.4 F, Pulse: 80 bpm, Respiratory Rate: 18 breaths/min, Blood Pressure: 162/92 mmHg. Respiratory normal breathing without difficulty. Psychiatric this patient is able to make decisions and demonstrates good insight into disease process. Alert and Oriented x 3. pleasant and cooperative. General Notes: Upon inspection patient's wound bed actually showed signs of excellent granulation epithelization at this point. Fortunately there does not appear to be any signs of active infection systemically which is great and overall I am extremely pleased with where we stand today. Overall I think that  the wound is dramatically improved and hopefully will continue to make good progress. Integumentary (Hair, Skin) Wound #1 status is Open. Original cause of wound was Gradually Appeared. The date acquired was: 11/18/2015. The wound has been in treatment 222 weeks. The wound is located on the Left,Lateral Lower Leg. The wound measures 5.5cm length x 2.7cm width x 0.4cm depth; 11.663cm^2 area and 4.665cm^3 volume. There is tendon and Fat Layer (Subcutaneous Tissue) exposed. There is no tunneling or undermining noted. There is a medium amount of serosanguineous drainage noted. There is large (67-100%) red, hyper - granulation within the wound bed. There is a small (1-33%) amount of necrotic tissue within the wound bed including Adherent Slough. 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 There was a Four Layer Compression Therapy Procedure by Carlene Coria, RN. Post procedure Diagnosis Wound #1: Same as Pre-Procedure Plan Follow-up Appointments: Return Appointment in 1 week. Nurse Visit as needed - twice a week Bathing/ Shower/ Hygiene: Clean wound with Normal Saline or wound cleanser. Other: - Apply dakins soaked gauze on wound after cleansing wound IN OFFICE ONLY Edema Control - Lymphedema / Segmental Compressive Device / Other: Optional: One layer of unna paste to top of compression wrap (to act as an anchor). Elevate, Exercise Daily and Avoid Standing for Long Periods of Time. Elevate legs to the level of the heart and pump ankles as often as possible Elevate leg(s)  parallel to the floor when sitting. WOUND #1: - Lower Leg Wound Laterality: Left, Lateral Cleanser: Wound Cleanser 3 x Per Week/30 Days Discharge Instructions: Wash your hands with soap and water. Remove old dressing, discard into plastic bag and place into trash. Cleanse the  wound with Wound Cleanser prior to applying a clean dressing using gauze sponges, not tissues or cotton balls. Do not scrub or use excessive force. Pat dry using gauze sponges, not tissue or cotton balls. Peri-Wound Care: Moisturizing Lotion 3 x Per Week/30 Days Discharge Instructions: Suggestions: Theraderm, Eucerin, Cetaphil, or patient preference. Topical: Clobetasol Propionate ointment 0.05%, 60 (g) tube 3 x Per Week/30 Days Discharge Instructions: Apply to 7 o'clock depth Primary Dressing: Hydrofera Blue Ready Transfer Foam, 4x5 (in/in) 3 x Per Week/30 Days Discharge Instructions: Apply Hydrofera Blue Ready to wound bed as directed Secondary Dressing: Xtrasorb Medium 4x5 (in/in) 3 x Per Week/30 Days Discharge Instructions: Apply to wound as directed. Do not cut. Compression Wrap: Medichoice 4 layer Compression System, 35-40 mmHG (Generic) 3 x Per Week/30 Days Discharge Instructions: Apply multi-layer wrap as directed. 1. Would recommend currently that we go ahead and continue with the wound care measures as before and the patient is in agreement with that plan. This includes the use of the clobetasol to the wound bed followed by the [facility] and then the compression wrapping. 2. I am also can recommend that we have the patient continue with the 4-layer compression wrap in particular which I think is doing a great job keeping his edema under control. 3. We are getting need to consider making sure that he has good compression socks for once he heals as I feel like he is getting much closer to complete resolution. We will see patient back for reevaluation in 1 week here in the clinic. If anything worsens or  changes patient will contact our office for additional recommendations. Electronic Signature(s) Signed: 03/12/2021 9:19:49 AM By: Worthy Keeler PA-C Entered By: Worthy Keeler on 03/12/2021 09:19:49 ACXEL, DINGEE ([id number]) -------------------------------------------------------------------------------- SuperBill Details Patient Name: [name] Date of Service: 03/12/2021 Medical Record Number: [id number] Patient Account Number: [id number] Date of Birth/Sex: [birth year] (40 y.o. M) Treating RN: Carlene Coria Primary Care Provider: [name] Other Clinician: Referring Provider: [name] Treating Provider/Extender: [name] Cliche in Treatment: 222 Diagnosis Coding ICD-10 Codes Code Description [id number] Non-pressure chronic ulcer of left calf with fat layer exposed L88 Pyoderma gangrenosum L97.321 Non-pressure chronic ulcer of left ankle limited to breakdown of skin I87.2 Venous insufficiency (chronic) (peripheral) L03.116 Cellulitis of left lower limb F17.208 Nicotine dependence, unspecified, with other nicotine-induced disorders Facility Procedures CPT4 Code: 21308657 Description: (Facility Use Only) [id number] - APPLY MULTLAY COMPRS LWR RT LEG Modifier: Quantity: 1 Physician Procedures CPT4 Code: 5284132 Description: 44010 - WC PHYS LEVEL 4 - EST PT Modifier: Quantity: 1 CPT4 Code: Description: ICD-10 Diagnosis Description L97.222 Non-pressure chronic ulcer of left calf with fat layer exposed L88 Pyoderma gangrenosum L97.321 Non-pressure chronic ulcer of left ankle limited to breakdown of skin I87.2 Venous insufficiency (chronic)  (peripheral) Modifier: Quantity: Electronic Signature(s) Signed: 03/12/2021 2:54:08 PM By: Carlene Coria RN Signed: 03/12/2021 4:31:19 PM By: Worthy Keeler PA-C Previous Signature: 03/12/2021 9:20:18 AM Version By: Worthy Keeler PA-C Entered By: Carlene Coria on 03/12/2021 14:54:08

## 2021-03-13 ENCOUNTER — Encounter: Payer: 59 | Admitting: Physician Assistant

## 2021-03-13 NOTE — Progress Notes (Signed)
[name], [name] ([id number]) Visit Report for 03/12/2021 Arrival Information Details Patient Name: [name], [name] Date of Service: 03/12/2021 8:00 AM Medical Record Number: [id number] Patient Account Number: [id number] Date of Birth/Sex: [birth year] (40 y.o. M) Treating RN: Carlene Coria Primary Care Darinda [place]: [name] Other Clinician: Referring Tulip Meharg: [name] Treating Cele Mote/Extender: [name] Cliche in Treatment: 58 Visit Information History Since Last Visit All ordered tests and consults were completed: No Patient Arrived: Ambulatory Added or deleted any medications: No Arrival Time: 08:10 Any new allergies or adverse reactions: No Accompanied By: self Had a fall or experienced change in No Transfer Assistance: None activities of daily living that may affect Patient Identification Verified: Yes risk of falls: Secondary Verification Process Completed: Yes Signs or symptoms of abuse/neglect since last visito No Patient Requires Transmission-Based No Hospitalized since last visit: No Precautions: Implantable device outside of the clinic excluding No Patient Has Alerts: Yes cellular tissue based products placed in the center Patient Alerts: Patient has reaction since last visit: to Has Dressing in Place as Prescribed: Yes silver dressings. Has Compression in Place as Prescribed: Yes Pain Present Now: No Electronic Signature(s) Signed: 03/13/2021 4:18:25 PM By: Carlene Coria RN Entered By: Carlene Coria on 03/12/2021 08:13:11 Tunney, [name] ([id number]) -------------------------------------------------------------------------------- Clinic Level of Care Assessment Details Patient Name: [name], [name] Date of Service: 03/12/2021 8:00 AM Medical Record Number: [id number] Patient Account Number: [id number] Date of Birth/Sex: [birth year] (40 y.o. M) Treating RN: Carlene Coria Primary Care Talar Fraley: [name] Other Clinician: Referring Muhsin Doris: [name] Treating Jahnay Lantier/Extender: [name] Cliche in Treatment: [facility] Level of Care Assessment Items TOOL 1 Quantity Score []  - Use when EandM and Procedure is performed on INITIAL visit 0 ASSESSMENTS - Nursing Assessment / Reassessment []  - General Physical Exam (combine w/ comprehensive assessment (listed just below) when performed on new 0 pt. evals) []  - 0 Comprehensive Assessment (HX, ROS, Risk Assessments, Wounds Hx, etc.) ASSESSMENTS - Wound and Skin Assessment / Reassessment []  - Dermatologic / Skin Assessment (not related to wound area) 0 ASSESSMENTS - Ostomy and/or Continence Assessment and Care []  - Incontinence Assessment and Management 0 []  - 0 Ostomy Care Assessment and Management (repouching, etc.) PROCESS - Coordination of Care []  - Simple Patient / Family Education for ongoing care 0 []  - 0 Complex (extensive) Patient / Family Education for ongoing care []  - 0 Staff obtains Programmer, systems, Records, Test Results / Process Orders []  - 0 Staff telephones HHA, Nursing Homes / Clarify orders / etc []  - 0 Routine Transfer to another Facility (non-emergent condition) []  - 0 Routine Hospital Admission (non-emergent condition) []  - 0 New Admissions / Biomedical engineer / Ordering NPWT, Apligraf, etc. []  - 0 Emergency Hospital Admission (emergent condition) PROCESS - Special Needs []  - Pediatric / Minor Patient Management 0 []  - 0 Isolation Patient Management []  - 0 Hearing / Language / Visual special needs []  - 0 Assessment of Community assistance (transportation, D/C planning, etc.) []  - 0 Additional assistance / Altered mentation []  - 0 Support Surface(s) Assessment (bed, cushion, seat, etc.) INTERVENTIONS - Miscellaneous []  - External ear exam 0 []  - 0 Patient Transfer (multiple staff / Civil Service fast streamer / Similar devices) []  - 0 Simple Staple / Suture removal (25 or less) []  - 0 Complex Staple / Suture removal (26 or more) []  -  0 Hypo/Hyperglycemic Management (do not check if billed separately) []  - 0 Ankle / Brachial Index (ABI) - do not check if billed separately Has the patient been seen at the  hospital within the last three years: Yes Total Score: 0 Level Of Care: ____ [name] ([id number]) Electronic Signature(s) Signed: 03/13/2021 4:18:25 PM By: Carlene Coria RN Entered By: Carlene Coria on 03/12/2021 14:53:46 Hofferber, [name] ([id number]) -------------------------------------------------------------------------------- Compression Therapy Details Patient Name: [name] Date of Service: 03/12/2021 8:00 AM Medical Record Number: [id number] Patient Account Number: [id number] Date of Birth/Sex: 1981-03-04 (40 y.o. M) Treating RN: Carlene Coria Primary Care Zaya Kessenich: [name] Other Clinician: Referring Rakisha Pincock: [name] Treating Harleigh Civello/Extender: [name] Cliche in Treatment: 222 Compression Therapy Performed for Wound Assessment: Wound #1 Left,Lateral Lower Leg Performed By: Clinician Carlene Coria, RN Compression Type: Four Layer Post Procedure Diagnosis Same as Pre-procedure Electronic Signature(s) Signed: 03/13/2021 4:18:25 PM By: Carlene Coria RN Entered By: Carlene Coria on 03/12/2021 08:45:40 [name] ([id number]) -------------------------------------------------------------------------------- Encounter Discharge Information Details Patient Name: [name] Date of Service: 03/12/2021 8:00 AM Medical Record Number: [id number] Patient Account Number: [id number] Date of Birth/Sex: June 13, 1980 (40 y.o. M) Treating RN: Carlene Coria Primary Care Kailena Lubas: [name] Other Clinician: Referring Deshante Cassell: [name] Treating Sanvika Cuttino/Extender: [name] Cliche in Treatment: 222 Encounter Discharge Information Items Discharge Condition: Stable Ambulatory Status: Ambulatory Discharge Destination: Home Transportation: Private Auto Accompanied By: self Schedule  Follow-up Appointment: Yes Clinical Summary of Care: Patient Declined Electronic Signature(s) Signed: 03/12/2021 2:54:59 PM By: Carlene Coria RN Entered By: Carlene Coria on 03/12/2021 14:54:59 [name] ([id number]) -------------------------------------------------------------------------------- Lower Extremity Assessment Details Patient Name: [name] Date of Service: 03/12/2021 8:00 AM Medical Record Number: [id number] Patient Account Number: [id number] Date of Birth/Sex: 07-20-1980 (40 y.o. M) Treating RN: Carlene Coria Primary Care Janika Jedlicka: [name] Other Clinician: Referring Tashyra Adduci: [name] Treating Alliana Mcauliff/Extender: [name] Cliche in Treatment: 222 Edema Assessment Assessed: [Left: No] [Right: No] Edema: [Left: Ye] [Right: s] Calf Left: Right: Point of Measurement: 33 cm From Medial Instep 47 cm Ankle Left: Right: Point of Measurement: 12 cm From Medial Instep 28 cm Vascular Assessment Pulses: Dorsalis Pedis Palpable: [Left:Yes] Electronic Signature(s) Signed: 03/13/2021 4:18:25 PM By: Carlene Coria RN Entered By: Carlene Coria on 03/12/2021 08:19:31 Sibley, [name] ([id number]) -------------------------------------------------------------------------------- Multi Wound Chart Details Patient Name: [name] Date of Service: 03/12/2021 8:00 AM Medical Record Number: [id number] Patient Account Number: [id number] Date of Birth/Sex: 27-Nov-1980 (40 y.o. M) Treating RN: Carlene Coria Primary Care Harper Smoker: [name] Other Clinician: Referring Edona Schreffler: [name] Treating Shastina Rua/Extender: [name] Cliche in Treatment: 222 Vital Signs Height(in): 71 Pulse(bpm): 80 Weight(lbs): 338 Blood Pressure(mmHg): 162/92 Body Mass Index(BMI): 47 Temperature(F): 98.4 Respiratory Rate(breaths/min): 18 Photos: [N/A:N/A] Wound Location: Left, Lateral Lower Leg N/A N/A Wounding Event: Gradually Appeared N/A N/A Primary Etiology: Pyoderma  N/A N/A Comorbid History: Sleep Apnea, Hypertension, Colitis N/A N/A Date Acquired: 11/18/2015 N/A N/A Weeks of Treatment: 222 N/A N/A Wound Status: Open N/A N/A Measurements L x W x D (cm) 5.5x2.7x0.4 N/A N/A Area (cm) : 11.663 N/A N/A Volume (cm) : 4.665 N/A N/A % Reduction in Area: -137.60% N/A N/A % Reduction in Volume: -18.80% N/A N/A Classification: Full Thickness With Exposed N/A N/A Support Structures Exudate Amount: Medium N/A N/A Exudate Type: Serosanguineous N/A N/A Exudate Color: red, brown N/A N/A Granulation Amount: Large (67-100%) N/A N/A Granulation Quality: Red, Hyper-granulation N/A N/A Necrotic Amount: Small (1-33%) N/A N/A Exposed Structures: Fat Layer (Subcutaneous Tissue): N/A N/A Yes Tendon: Yes Fascia: No Muscle: No Joint: No Bone: No Epithelialization: Medium (34-66%) N/A N/A Treatment Notes Electronic Signature(s) Signed: 03/13/2021 4:18:25 PM By: Carlene Coria RN Entered By: Carlene Coria on 03/12/2021 08:45:14 Figiel, Yaniel (  [id number]) -------------------------------------------------------------------------------- Multi-Disciplinary Care Plan Details Patient Name: KRAMER, HANRAHAN Date of Service: 03/12/2021 8:00 AM Medical Record Number: [id number] Patient Account Number: [id number] Date of Birth/Sex: 09-05-1980 (40 y.o. M) Treating RN: Carlene Coria Primary Care Malikhi Ogan: [name] Other Clinician: Referring Natasha Paulson: [name] Treating Jaydan Chretien/Extender: [name] Cliche in Treatment: Mackey reviewed with physician Active Inactive Electronic Signature(s) Signed: 03/13/2021 4:18:25 PM By: Carlene Coria RN Entered By: Carlene Coria on 03/12/2021 08:45:00 [name] ([id number]) -------------------------------------------------------------------------------- Pain Assessment Details Patient Name: [name] Date of Service: 03/12/2021 8:00 AM Medical Record Number: [id number] Patient Account Number:  [id number] Date of Birth/Sex: 08/05/1980 (40 y.o. M) Treating RN: Carlene Coria Primary Care Karyna Bessler: [name] Other Clinician: Referring Alessio Bogan: [name] Treating Manson Luckadoo/Extender: [name] Cliche in Treatment: 222 Active Problems Location of Pain Severity and Description of Pain Patient Has Paino No Site Locations Pain Management and Medication Current Pain Management: Electronic Signature(s) Signed: 03/13/2021 4:18:25 PM By: Carlene Coria RN Entered By: Carlene Coria on 03/12/2021 08:13:48 [name] ([id number]) -------------------------------------------------------------------------------- Patient/Caregiver Education Details Patient Name: [name] Date of Service: 03/12/2021 8:00 AM Medical Record Number: [id number] Patient Account Number: [id number] Date of Birth/Gender: 08-09-1980 (40 y.o. M) Treating RN: Carlene Coria Primary Care Physician: [name] Other Clinician: Referring Physician: [name] Treating Physician/Extender: [name] Cliche in Treatment: 222 Education Assessment Education Provided To: Patient Education Topics Provided Wound/Skin Impairment: Methods: Explain/Verbal Responses: State content correctly Electronic Signature(s) Signed: 03/13/2021 4:18:25 PM By: Carlene Coria RN Entered By: Carlene Coria on 03/12/2021 14:54:18 [name] ([id number]) -------------------------------------------------------------------------------- Wound Assessment Details Patient Name: [name] Date of Service: 03/12/2021 8:00 AM Medical Record Number: [id number] Patient Account Number: [id number] Date of Birth/Sex: 1980-08-30 (40 y.o. M) Treating RN: Carlene Coria Primary Care Vergene Marland: [name] Other Clinician: Referring Kinley Ferrentino: [name] Treating Williard Keller/Extender: [name] Cliche in Treatment: 222 Wound Status Wound Number: 1 Primary Etiology: Pyoderma Wound Location: Left, Lateral Lower Leg Wound  Status: Open Wounding Event: Gradually Appeared Comorbid History: Sleep Apnea, Hypertension, Colitis Date Acquired: 11/18/2015 Weeks Of Treatment: 222 Clustered Wound: No Photos Wound Measurements Length: (cm) 5.5 Width: (cm) 2.7 Depth: (cm) 0.4 Area: (cm) 11.663 Volume: (cm) 4.665 % Reduction in Area: -137.6% % Reduction in Volume: -18.8% Epithelialization: Medium (34-66%) Tunneling: No Undermining: No Wound Description Classification: Full Thickness With Exposed Support Structures Exudate Amount: Medium Exudate Type: Serosanguineous Exudate Color: red, brown Foul Odor After Cleansing: No Slough/Fibrino Yes Wound Bed Granulation Amount: Large (67-100%) Exposed Structure Granulation Quality: Red, Hyper-granulation Fascia Exposed: No Necrotic Amount: Small (1-33%) Fat Layer (Subcutaneous Tissue) Exposed: Yes Necrotic Quality: Adherent Slough Tendon Exposed: Yes Muscle Exposed: No Joint Exposed: No Bone Exposed: No Treatment Notes Wound #1 (Lower Leg) Wound Laterality: Left, Lateral Cleanser Wound Cleanser Discharge Instruction: Wash your hands with soap and water. Remove old dressing, discard into plastic bag and place into trash. Cleanse the wound with Wound Cleanser prior to applying a clean dressing using gauze sponges, not tissues or cotton balls. Do not scrub or use excessive force. Pat dry using gauze sponges, not tissue or cotton balls. SHAYDON, LEASE ([id number]) [place] Lotion Discharge Instruction: Suggestions: Theraderm, Eucerin, Cetaphil, or patient preference. Topical Clobetasol Propionate ointment 0.05%, 60 (g) tube Discharge Instruction: Apply to 7 o'clock depth Primary Dressing Hydrofera Blue Ready Transfer Foam, 4x5 (in/in) Discharge Instruction: Apply Hydrofera Blue Ready to wound bed as directed Secondary Dressing Xtrasorb Medium 4x5 (in/in) Discharge Instruction: Apply to wound as directed. Do not cut. Secured  With  Compression Wrap Medichoice 4 layer Compression System, 35-40 mmHG Discharge Instruction: Apply multi-layer wrap as directed. Compression Stockings Add-Ons Electronic Signature(s) Signed: 03/13/2021 4:18:25 PM By: Carlene Coria RN Entered By: Carlene Coria on 03/12/2021 08:18:53 SHEAMUS, HASTING ([id number]) -------------------------------------------------------------------------------- Vitals Details Patient Name: [name] Date of Service: 03/12/2021 8:00 AM Medical Record Number: [id number] Patient Account Number: [id number] Date of Birth/Sex: March 16, 1981 (40 y.o. M) Treating RN: Carlene Coria Primary Care Marialena Wollen: [name] Other Clinician: Referring Itzayana Pardy: [name] Treating Kalden Wanke/Extender: [name] Cliche in Treatment: 222 Vital Signs Time Taken: 08:13 Temperature (F): 98.4 Height (in): 71 Pulse (bpm): 80 Weight (lbs): 338 Respiratory Rate (breaths/min): 18 Body Mass Index (BMI): 47.1 Blood Pressure (mmHg): 162/92 Reference Range: 80 - 120 mg / dl Electronic Signature(s) Signed: 03/13/2021 4:18:25 PM By: Carlene Coria RN Entered By: Carlene Coria on 03/12/2021 08:13:37

## 2021-03-14 ENCOUNTER — Other Ambulatory Visit: Payer: Self-pay

## 2021-03-14 ENCOUNTER — Ambulatory Visit: Payer: 59 | Admitting: Primary Care

## 2021-03-14 DIAGNOSIS — L97222 Non-pressure chronic ulcer of left calf with fat layer exposed: Secondary | ICD-10-CM | POA: Diagnosis not present

## 2021-03-14 NOTE — Progress Notes (Signed)
[name], BARANOWSKI ([id number]) Visit Report for 03/14/2021 Arrival Information Details Patient Name: [name], [name] Date of Service: 03/14/2021 8:00 AM Medical Record Number: [id number] Patient Account Number: [id number] Date of Birth/Sex: [birth year] (40 y.o. M) Treating RN: Donnamarie Poag Primary Care Kathleen Tamm: [name] Other Clinician: Referring Daysi Boggan: [name] Treating Shonnie Poudrier/Extender: Yaakov Guthrie in Treatment: 68 Visit Information History Since Last Visit Added or deleted any medications: No Patient Arrived: Ambulatory Had a fall or experienced change in No Arrival Time: 08:04 activities of daily living that may affect Accompanied By: self risk of falls: Transfer Assistance: None Hospitalized since last visit: No Patient Identification Verified: Yes Has Dressing in Place as Prescribed: Yes Secondary Verification Process Completed: Yes Has Compression in Place as Prescribed: Yes Patient Requires Transmission-Based No Pain Present Now: No Precautions: Patient Has Alerts: Yes Patient Alerts: Patient has reaction to silver dressings. Electronic Signature(s) Signed: 03/14/2021 12:29:48 PM By: Donnamarie Poag Entered By: Donnamarie Poag on 03/14/2021 08:22:57 [name] ([id number]) -------------------------------------------------------------------------------- Clinic Level of Care Assessment Details Patient Name: [name] Date of Service: 03/14/2021 8:00 AM Medical Record Number: [id number] Patient Account Number: [id number] Date of Birth/Sex: Feb 02, 1981 (40 y.o. M) Treating RN: Donnamarie Poag Primary Care Ehren Berisha: [name] Other Clinician: Referring Juanita Devincent: [name] Treating Duanne Duchesne/Extender: Yaakov Guthrie in Treatment: 223 Clinic Level of Care Assessment Items TOOL 1 Quantity Score []  - Use when EandM and Procedure is performed on INITIAL visit 0 ASSESSMENTS - Nursing Assessment / Reassessment []  - General Physical Exam  (combine w/ comprehensive assessment (listed just below) when performed on new 0 pt. evals) []  - 0 Comprehensive Assessment (HX, ROS, Risk Assessments, Wounds Hx, etc.) ASSESSMENTS - Wound and Skin Assessment / Reassessment []  - Dermatologic / Skin Assessment (not related to wound area) 0 ASSESSMENTS - Ostomy and/or Continence Assessment and Care []  - Incontinence Assessment and Management 0 []  - 0 Ostomy Care Assessment and Management (repouching, etc.) PROCESS - Coordination of Care []  - Simple Patient / Family Education for ongoing care 0 []  - 0 Complex (extensive) Patient / Family Education for ongoing care []  - 0 Staff obtains Consents, Records, Test Results / Process Orders []  - 0 Staff telephones HHA, Nursing Homes / Clarify orders / etc []  - 0 Routine Transfer to another Facility (non-emergent condition) []  - 0 Routine Hospital Admission (non-emergent condition) []  - 0 New Admissions / Biomedical engineer / Ordering NPWT, Apligraf, etc. []  - 0 Emergency Hospital Admission (emergent condition) PROCESS - Special Needs []  - Pediatric / Minor Patient Management 0 []  - 0 Isolation Patient Management []  - 0 Hearing / Language / Visual special needs []  - 0 Assessment of Community assistance (transportation, D/C planning, etc.) []  - 0 Additional assistance / Altered mentation []  - 0 Support Surface(s) Assessment (bed, cushion, seat, etc.) INTERVENTIONS - Miscellaneous []  - External ear exam 0 []  - 0 Patient Transfer (multiple staff / Civil Service fast streamer / Similar devices) []  - 0 Simple Staple / Suture removal (25 or less) []  - 0 Complex Staple / Suture removal (26 or more) []  - 0 Hypo/Hyperglycemic Management (do not check if billed separately) []  - 0 Ankle / Brachial Index (ABI) - do not check if billed separately Has the patient been seen at the hospital within the last three years: Yes Total Score: 0 Level Of Care: ____ [name] ([id number]) Electronic  Signature(s) Signed: 03/14/2021 12:29:48 PM By: Donnamarie Poag Entered By: Donnamarie Poag on 03/14/2021 08:24:39 Swinson, [name] ([id number]) -------------------------------------------------------------------------------- Compression Therapy Details Patient Name: [name]  Date of Service: 03/14/2021 8:00 AM Medical Record Number: [id number] Patient Account Number: [id number] Date of Birth/Sex: July 26, 1980 (40 y.o. M) Treating RN: Donnamarie Poag Primary Care Gertrue Willette: [name] Other Clinician: Referring Zen Felling: [name] Treating Chana Lindstrom/Extender: Yaakov Guthrie in Treatment: 223 Compression Therapy Performed for Wound Assessment: Wound #1 Left,Lateral Lower Leg Performed By: Junius Argyle, RN Compression Type: Four Layer Electronic Signature(s) Signed: 03/14/2021 12:29:48 PM By: Donnamarie Poag Entered By: Donnamarie Poag on 03/14/2021 08:23:59 [name] ([id number]) -------------------------------------------------------------------------------- Encounter Discharge Information Details Patient Name: [name] Date of Service: 03/14/2021 8:00 AM Medical Record Number: [id number] Patient Account Number: [id number] Date of Birth/Sex: [birth year] (40 y.o. M) Treating RN: Donnamarie Poag Primary Care Quinlyn Tep: [name] Other Clinician: Referring Sanford Lindblad: [name] Treating Aarit Kashuba/Extender: Yaakov Guthrie in Treatment: 223 Encounter Discharge Information Items Discharge Condition: Stable Ambulatory Status: Ambulatory Discharge Destination: Home Transportation: Private Auto Accompanied By: self Schedule Follow-up Appointment: Yes Clinical Summary of Care: Electronic Signature(s) Signed: 03/14/2021 12:29:48 PM By: Donnamarie Poag Entered By: Donnamarie Poag on 03/14/2021 08:24:32 [name] ([id number]) -------------------------------------------------------------------------------- Wound Assessment Details Patient Name: [name] Date of  Service: 03/14/2021 8:00 AM Medical Record Number: [id number] Patient Account Number: [id number] Date of Birth/Sex: 01-17-1981 (40 y.o. M) Treating RN: Donnamarie Poag Primary Care Kaileb Monsanto: [name] Other Clinician: Referring Adiah Guereca: [name] Treating Oniya Mandarino/Extender: Yaakov Guthrie in Treatment: 223 Wound Status Wound Number: 1 Primary Etiology: Pyoderma Wound Location: Left, Lateral Lower Leg Wound Status: Open Wounding Event: Gradually Appeared Comorbid History: Sleep Apnea, Hypertension, Colitis Date Acquired: 11/18/2015 Weeks Of Treatment: 223 Clustered Wound: No Wound Measurements Length: (cm) 5.5 Width: (cm) 2.7 Depth: (cm) 0.4 Area: (cm) 11.663 Volume: (cm) 4.665 % Reduction in Area: -137.6% % Reduction in Volume: -18.8% Epithelialization: Medium (34-66%) Wound Description Classification: Full Thickness With Exposed Support Structures Exudate Amount: Medium Exudate Type: Serosanguineous Exudate Color: red, brown Foul Odor After Cleansing: No Slough/Fibrino Yes Wound Bed Granulation Amount: Large (67-100%) Exposed Structure Granulation Quality: Red, Hyper-granulation Fascia Exposed: No Necrotic Amount: Small (1-33%) Fat Layer (Subcutaneous Tissue) Exposed: Yes Necrotic Quality: Adherent Slough Tendon Exposed: Yes Muscle Exposed: No Joint Exposed: No Bone Exposed: No Treatment Notes Wound #1 (Lower Leg) Wound Laterality: Left, Lateral Cleanser Wound Cleanser Discharge Instruction: Wash your hands with soap and water. Remove old dressing, discard into plastic bag and place into trash. Cleanse the wound with Wound Cleanser prior to applying a clean dressing using gauze sponges, not tissues or cotton balls. Do not scrub or use excessive force. Pat dry using gauze sponges, not tissue or cotton balls. Peri-Wound Care Moisturizing Lotion Discharge Instruction: Suggestions: Theraderm, Eucerin, Cetaphil, or patient  preference. Topical Clobetasol Propionate ointment 0.05%, 60 (g) tube Discharge Instruction: Apply to 7 o'clock depth Primary Dressing Hydrofera Blue Ready Transfer Foam, 4x5 (in/in) Discharge Instruction: Apply Hydrofera Blue Ready to wound bed as directed Secondary Dressing [employer] 4x5 (in/in) BRANNON, LEVENE ([id number]) Discharge Instruction: Apply to wound as directed. Do not cut. Secured With Compression Wrap Medichoice 4 layer Compression System, 35-40 mmHG Discharge Instruction: Apply multi-layer wrap as directed. Compression Stockings Add-Ons Electronic Signature(s) Signed: 03/14/2021 12:29:48 PM By: Donnamarie Poag Entered ByDonnamarie Poag on 03/14/2021 65:46:50

## 2021-03-16 ENCOUNTER — Other Ambulatory Visit: Payer: Self-pay

## 2021-03-16 DIAGNOSIS — L97222 Non-pressure chronic ulcer of left calf with fat layer exposed: Secondary | ICD-10-CM | POA: Diagnosis not present

## 2021-03-16 NOTE — Progress Notes (Signed)
NELDON, SHEPARD ([id number]) Visit Report for 03/16/2021 Arrival Information Details Patient Name: [name] Date of Service: 03/16/2021 8:00 AM Medical Record Number: [id number] Patient Account Number: [id number] Date of Birth/Sex: 1981-03-16 (40 y.o. M) Treating RN: Donnamarie Poag Primary Care Sarim Rothman: [name] Other Clinician: Referring Lanier Millon: [name] Treating Turhan Chill/Extender: [name] Cliche in Treatment: 47 Visit Information History Since Last Visit Added or deleted any medications: No Patient Arrived: Ambulatory Had a fall or experienced change in No Arrival Time: 08:04 activities of daily living that may affect Accompanied By: self risk of falls: Transfer Assistance: None Hospitalized since last visit: No Patient Identification Verified: Yes Has Dressing in Place as Prescribed: Yes Secondary Verification Process Completed: Yes Has Compression in Place as Prescribed: Yes Patient Requires Transmission-Based No Pain Present Now: No Precautions: Patient Has Alerts: Yes Patient Alerts: Patient has reaction to silver dressings. Electronic Signature(s) Signed: 03/16/2021 2:10:52 PM By: Donnamarie Poag Entered By: Donnamarie Poag on 03/16/2021 08:24:52 [name] ([id number]) -------------------------------------------------------------------------------- Clinic Level of Care Assessment Details Patient Name: [name] Date of Service: 03/16/2021 8:00 AM Medical Record Number: [id number] Patient Account Number: [id number] Date of Birth/Sex: Jul 10, 1980 (40 y.o. M) Treating RN: Donnamarie Poag Primary Care Parmvir Boomer: [name] Other Clinician: Referring Xena Propst: [name] Treating Inioluwa Baris/Extender: [name] Cliche in Treatment: [facility] Level of Care Assessment Items TOOL 1 Quantity Score []  - Use when EandM and Procedure is performed on INITIAL visit 0 ASSESSMENTS - Nursing Assessment / Reassessment []  - General Physical Exam (combine w/  comprehensive assessment (listed just below) when performed on new 0 pt. evals) []  - 0 Comprehensive Assessment (HX, ROS, Risk Assessments, Wounds Hx, etc.) ASSESSMENTS - Wound and Skin Assessment / Reassessment []  - Dermatologic / Skin Assessment (not related to wound area) 0 ASSESSMENTS - Ostomy and/or Continence Assessment and Care []  - Incontinence Assessment and Management 0 []  - 0 Ostomy Care Assessment and Management (repouching, etc.) PROCESS - Coordination of Care []  - Simple Patient / Family Education for ongoing care 0 []  - 0 Complex (extensive) Patient / Family Education for ongoing care []  - 0 Staff obtains Programmer, systems, Records, Test Results / Process Orders []  - 0 Staff telephones HHA, Nursing Homes / Clarify orders / etc []  - 0 Routine Transfer to another Facility (non-emergent condition) []  - 0 Routine Hospital Admission (non-emergent condition) []  - 0 New Admissions / Biomedical engineer / Ordering NPWT, Apligraf, etc. []  - 0 Emergency Hospital Admission (emergent condition) PROCESS - Special Needs []  - Pediatric / Minor Patient Management 0 []  - 0 Isolation Patient Management []  - 0 Hearing / Language / Visual special needs []  - 0 Assessment of Community assistance (transportation, D/C planning, etc.) []  - 0 Additional assistance / Altered mentation []  - 0 Support Surface(s) Assessment (bed, cushion, seat, etc.) INTERVENTIONS - Miscellaneous []  - External ear exam 0 []  - 0 Patient Transfer (multiple staff / Civil Service fast streamer / Similar devices) []  - 0 Simple Staple / Suture removal (25 or less) []  - 0 Complex Staple / Suture removal (26 or more) []  - 0 Hypo/Hyperglycemic Management (do not check if billed separately) []  - 0 Ankle / Brachial Index (ABI) - do not check if billed separately Has the patient been seen at the hospital within the last three years: Yes Total Score: 0 Level Of Care: ____ [name] ([id number]) Electronic  Signature(s) Signed: 03/16/2021 2:10:52 PM By: Donnamarie Poag Entered By: Donnamarie Poag on 03/16/2021 08:26:00 Dahlen, [name] ([id number]) -------------------------------------------------------------------------------- Compression Therapy Details Patient Name: [name]  Date of Service: 03/16/2021 8:00 AM Medical Record Number: [id number] Patient Account Number: [id number] Date of Birth/Sex: January 20, 1981 (40 y.o. M) Treating RN: Donnamarie Poag Primary Care Alver Leete: [name] Other Clinician: Referring Travin Marik: [name] Treating Lesley Atkin/Extender: [name] Cliche in Treatment: 223 Compression Therapy Performed for Wound Assessment: Wound #1 Left,Lateral Lower Leg Performed By: Junius Argyle, RN Compression Type: Four Layer Electronic Signature(s) Signed: 03/16/2021 2:10:52 PM By: Donnamarie Poag Entered By: Donnamarie Poag on 03/16/2021 08:25:24 [name] ([id number]) -------------------------------------------------------------------------------- Encounter Discharge Information Details Patient Name: [name] Date of Service: 03/16/2021 8:00 AM Medical Record Number: [id number] Patient Account Number: [id number] Date of Birth/Sex: 1980/12/26 (40 y.o. M) Treating RN: Donnamarie Poag Primary Care Overton Boggus: [name] Other Clinician: Referring Ethelle Ola: [name] Treating Laken Lobato/Extender: [name] Cliche in Treatment: 223 Encounter Discharge Information Items Discharge Condition: Stable Ambulatory Status: Ambulatory Discharge Destination: Home Transportation: Private Auto Accompanied By: self Schedule Follow-up Appointment: Yes Clinical Summary of Care: Electronic Signature(s) Signed: 03/16/2021 2:10:52 PM By: Donnamarie Poag Entered By: Donnamarie Poag on 03/16/2021 08:25:54 [name], [name] ([id number]) -------------------------------------------------------------------------------- Wound Assessment Details Patient Name: [name] Date of Service:  03/16/2021 8:00 AM Medical Record Number: [id number] Patient Account Number: [id number] Date of Birth/Sex: [birth year] (40 y.o. M) Treating RN: Donnamarie Poag Primary Care Shaaron Golliday: [name] Other Clinician: Referring Tiandra Swoveland: [name] Treating Else Habermann/Extender: [name] Cliche in Treatment: 223 Wound Status Wound Number: 1 Primary Etiology: Pyoderma Wound Location: Left, Lateral Lower Leg Wound Status: Open Wounding Event: Gradually Appeared Comorbid History: Sleep Apnea, Hypertension, Colitis Date Acquired: 11/18/2015 Weeks Of Treatment: 223 Clustered Wound: No Wound Measurements Length: (cm) 5.5 Width: (cm) 2.7 Depth: (cm) 0.4 Area: (cm) 11.663 Volume: (cm) 4.665 % Reduction in Area: -137.6% % Reduction in Volume: -18.8% Epithelialization: Medium (34-66%) Wound Description Classification: Full Thickness With Exposed Support Structures Exudate Amount: Medium Exudate Type: Serosanguineous Exudate Color: red, brown Foul Odor After Cleansing: No Slough/Fibrino Yes Wound Bed Granulation Amount: Large (67-100%) Exposed Structure Granulation Quality: Red, Hyper-granulation Fascia Exposed: No Necrotic Amount: Small (1-33%) Fat Layer (Subcutaneous Tissue) Exposed: Yes Necrotic Quality: Adherent Slough Tendon Exposed: Yes Muscle Exposed: No Joint Exposed: No Bone Exposed: No Treatment Notes Wound #1 (Lower Leg) Wound Laterality: Left, Lateral Cleanser Wound Cleanser Discharge Instruction: Wash your hands with soap and water. Remove old dressing, discard into plastic bag and place into trash. Cleanse the wound with Wound Cleanser prior to applying a clean dressing using gauze sponges, not tissues or cotton balls. Do not scrub or use excessive force. Pat dry using gauze sponges, not tissue or cotton balls. Peri-Wound Care Moisturizing Lotion Discharge Instruction: Suggestions: Theraderm, Eucerin, Cetaphil, or patient preference. Topical Clobetasol  Propionate ointment 0.05%, 60 (g) tube Discharge Instruction: Apply to 7 o'clock depth Primary Dressing Hydrofera Blue Ready Transfer Foam, 4x5 (in/in) Discharge Instruction: Apply Hydrofera Blue Ready to wound bed as directed Secondary Dressing [employer] 4x5 (in/in) NICHLOS, KUNZLER ([id number]) Discharge Instruction: Apply to wound as directed. Do not cut. Secured With Compression Wrap Medichoice 4 layer Compression System, 35-40 mmHG Discharge Instruction: Apply multi-layer wrap as directed. Compression Stockings Add-Ons Electronic Signature(s) Signed: 03/16/2021 2:10:52 PM By: Donnamarie Poag Entered ByDonnamarie Poag on 03/16/2021 08:25:09

## 2021-03-19 ENCOUNTER — Encounter: Payer: 59 | Admitting: Physician Assistant

## 2021-03-19 ENCOUNTER — Other Ambulatory Visit: Payer: Self-pay

## 2021-03-19 DIAGNOSIS — L97222 Non-pressure chronic ulcer of left calf with fat layer exposed: Secondary | ICD-10-CM | POA: Diagnosis not present

## 2021-03-19 NOTE — Progress Notes (Addendum)
[name], [name] ([id number]) Visit Report for 03/19/2021 Chief Complaint Document Details Patient Name: [name], [name] Date of Service: 03/19/2021 8:15 AM Medical Record Number: [id number] Patient Account Number: [id number] Date of Birth/Sex: 1980-06-12 (40 y.o. 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 M) Treating RN: [name] Primary Care Provider: [name] Other Clinician: Referring Provider: [name] Treating Provider/Extender: [name] Cliche in Treatment: 223 History of Present Illness HPI Description: 12/04/16; 40 year old man who comes into the clinic today for review of a wound on the posterior left calf. He tells me that is been there for about a year. He is not a diabetic he does smoke half a pack per day. He was seen in the ER on 11/20/16 felt to have cellulitis around the wound and was given clindamycin. An x-ray did not show osteomyelitis. The patient initially tells me that he has a milk allergy that sets off a pruritic itching rash on his lower legs which she scratches incessantly and he thinks that's what may have set up the wound. He has been using various topical antibiotics and ointments without any effect. He works in a [employer] and is on his feet all day. He does not have a prior history  of wounds however he does have the rash on both lower legs the right arm and the ventral aspect of his left arm. These are excoriations and clearly have had scratching however there are of macular looking areas on both legs including a substantial larger area on the right leg. This does not have an underlying open area. There is no blistering. The patient tells me that 2 years ago in [place] in response to the rash on his legs he saw a dermatologist who told him he had a condition which may be pyoderma gangrenosum although I may be putting words into his mouth. He seemed to recognize this. On further questioning he admits to a 5 year history of quiesced. ulcerative colitis. He is not in any treatment for this. He's had no recent travel 12/11/16; the patient arrives today with his wound and roughly the same condition we've been using silver alginate this is a deep punched out wound with some surrounding erythema but no tenderness. Biopsy I did did not show confirmed pyoderma gangrenosum suggested nonspecific inflammation and vasculitis but does not provide an actual description of what was seen by the pathologist. I'm really not able to understand this We have also received information from the patient's dermatologist in [place] notes from April 2016. This was a doctor Agarwal-antal. 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 He was prescribed topical steroid high potency under occlusion which helped but at this point the patient did not have a deep punched out wound. 12/18/16; the patient's wound is larger in terms of surface area however this surface looks better and there is less depth. The surrounding erythema also is better. The patient states that the wrap we put on came off 2 days ago when he has been using his compression stockings.  He we are in the process of getting a dermatology consult. 12/26/16 on evaluation today patient's left lower extremity wound shows evidence of infection with  surrounding erythema noted. He has been tolerating the dressing changes but states that he has noted more discomfort. There is a larger area of erythema surrounding the wound. No fevers, chills, nausea, or vomiting noted at this time. With that being said the wound still does have slough covering the surface. He is not allergic to any medication that he is aware of at this point. In regard to his right lower extremity he had several regions that are erythematous and pruritic he wonders if there's anything we can do to help that. 01/02/17 I reviewed patient's wound culture which was obtained his visit last week. He was placed on doxycycline at that point. Unfortunately that does not appear to be an antibiotic that would likely help with the situation however the pseudomonas noted on culture is sensitive to Cipro. Also unfortunately patient's wound seems to have a large compared to last week's evaluation. Not severely so but there are definitely increased measurements in general. He is continuing to have discomfort as well he writes this to be a seven out of 10. In fact he would prefer me not to perform any debridement today due to the fact that he is having discomfort and considering he has an active infection on the little reluctant to do so anyway. No fevers, chills, nausea, or vomiting noted at this time. 01/08/17; patient seems dermatology on September 5. I suspect dermatology will want the slides from the biopsy I did sent to their pathologist. I'm not sure if there is a way we can expedite that. In any case the culture I did before I left on vacation 3 weeks ago showed Pseudomonas he was given 10 days of Cipro and per her description of her intake nurses is actually somewhat better this week although the wound is quite a bit bigger than I remember the last time I saw this. He still has 3 more days of Cipro 01/21/17; dermatology appointment tomorrow. He has completed the ciprofloxacin for Pseudomonas.  Surface of the wound looks better however he is had some deterioration in the lesions on his right leg. Meantime the left lateral leg wound we will continue with sample 01/29/17; patient had his dermatology appointment but I can't yet see that note. He is completed his antibiotics. The wound is more superficial but considerably larger in circumferential area than when he came in. This is in his left lateral calf. He also has swollen erythematous areas with superficial wounds on the right leg and small papular areas on both arms. There apparently areas in her his upper thighs and buttocks I did not look at those. Dermatology biopsied the right leg. Hopefully will have their input next week. 02/05/17; patient went back to see his dermatologist who told him that he had a "scratching problem" as well as staph. He is now on a 30 day course of doxycycline and I believe she gave him triamcinolone cream to the right leg areas to help with the itching [not exactly sure but probably triamcinolone]. She apparently looked at the left lateral leg wound although this was not rebiopsied and I think felt to be ultimately part of the same pathogenesis. He is using sample border foam and changing nevus himself. He now has a new open area on the right posterior leg which was his biopsy site I don't have any of the dermatology notes  02/12/17; we put the patient in compression last week with SANTYL to the wound on the left leg and the biopsy. Edema is much better and the depth of the wound is now at level of skin. Area is still the same oBiopsy site on the right lateral leg we've also been using santyl with a border foam dressing and he is changing this himself. 02/19/17; Using silver alginate started last week to both the substantial left leg wound and the biopsy site on the right wound. He is tolerating compression well. Has a an appointment with his primary M.D. tomorrow wondering about diuretics although I'm wondering if  the edema problem is actually lymphedema 02/26/17; the patient has been to see his primary doctor Dr. Jerrel Ivory at [place] our primary care. She started him on Lasix 20 mg and this seems to have helped with the edema. However we are not making substantial change with the left lateral calf wound and inflammation. The biopsy site on the right leg also looks stable but not really all that different. 03/12/17; the patient has been to see vein and vascular Dr. Lucky Cowboy. He has had venous reflux studies I have not reviewed these. I did get a call from his dermatology office. They felt that he might have pathergy based on their biopsy on his right leg which led them to look at the slides of SHERREL, SHAFER ([id number]) the biopsy I did on the left leg and they wonder whether this represents pyoderma gangrenosum which was the original supposition in a man with ulcerative colitis albeit inactive for many years. They therefore recommended clobetasol and tetracycline i.e. 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 03/26/17; apparently the patient just had reflux studies not an appointment with Dr. dew. She arrives in clinic today having applied clobetasol for 2-3 weeks. He notes over the last 2-3 days excessive drainage having to change the dressing 3-4 times a day and also expanding erythema. 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 Lillia Carmel at [facility] dermatology locally who kindly saw him at my request. 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 My concern was the degree of erythema medially extending from his left leg wound which was either inflammation from pyoderma or cellulitis. I  put him on Augmentin however culture of the wound showed Pseudomonas which is quinolone sensitive. I really don't believe he has cellulitis however in view of everything I will continue and give him a course of Cipro. He is also on doxycycline as an immune modulator for the pyoderma. In addition to his original wound on the left lateral leg with surrounding erythema he has a wound on the right posterior calf which was an original biopsy site done by dermatology. 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 Saw Dr. Lillia Carmel yesterday. He has been using topical antibiotics to both wound areas his original wound on the left and the biopsies/pathergy area on the right. There is definitely some improvement in the inflammation around the wound on the right although the patient states he has increasing sensitivity of the wounds. He is on prednisone 60 and doxycycline 1 as prescribed by Dr. Lillia Carmel. He is covering the topical antibiotic with gauze  and putting this in his own compression stocks and changing this daily. He states that Dr. Lottie Rater did a culture of the left leg wound yesterday 05/07/17; pyoderma gangrenosum. The patient saw Dr. Lillia Carmel yesterday and has a follow-up with her in one month. He is still using topical antibiotics to both wounds although he can't recall exactly what type. He is still on prednisone 60 mg. Dr. Lillia Carmel stated that the doxycycline could stop if we were in agreement. 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 The right medial leg was induced by biopsy/pathergy. The area on the right is essentially healed. 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 The right medial leg has closed and remains closed. He is still on  prednisone 60. oHe tells me he missed his last dermatology appointment with Dr. Lillia Carmel but will make another appointment. He reports that her blood sugar at a recent screen in [place] was high 200's. He was 180 today. 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 I think he is going to require still much longer prednisone perhaps another 3 months before attempting to taper. In the meantime his wound is a lot better. Smaller. He is cleaning this off daily and applying topical antibiotics. When he was last in the clinic I thought about changing to [facility] and actually put in a couple of calls to dermatology although probably not during their business hours. 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 He continues on oral prednisone. He has been using triple antibiotic ointment. There is surface debris and we will transition to [facility] and have him return in 2 weeks. He has lost 30 pounds since his last appointment with lifestyle modification. He may benefit from topical steroid cream for treatment this can be considered at a later date. 08/22/17 on evaluation today patient appears to actually be doing rather well in regard to his left lateral lower extremity ulcer. He has actually been managed by Dr. Dellia Nims most recently. Patient is currently on oral steroids at this time. This seems to have been of benefit for him. Nonetheless his last visit was actually with Leah on 08/06/17. Currently he is not utilizing any topical steroid creams although this could be of benefit as well. No fevers, chills, nausea, or vomiting noted at this time. 09/05/17 on evaluation today patient appears to be doing better in regard to his left lateral lower extremity ulcer. He has been tolerating the dressing changes without complication. He is using Santyl with good effect. Overall I'm very  pleased with how things are standing at this point. Patient likewise is happy that this is doing better. 09/19/17 on evaluation today patient actually appears to be doing rather well in regard to his left lateral lower extremity ulcer. 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 He's not having any significant pain. 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 He has been tolerating the Santyl without complication and in general I feel like he's making good progress. 10/17/17 on evaluation today patient  appears to be doing very well in regard to his left lateral lower surety ulcer. He has been tolerating the dressing changes without complication. There does not appear to be any evidence of infection he's alternating the Santyl and the triple antibiotic ointment every other day this seems to be doing well for him. 11/03/17 on evaluation today patient appears to be doing very well in regard to his left lateral lower extremity ulcer. He is been tolerating the dressing changes without complication which is good news. Fortunately there does not appear to be any evidence of infection which is also great news. Overall is doing excellent they are starting to taper down on the prednisone is down to 40 mg at this point it also started topical clobetasol for him. 11/17/17 on evaluation today patient appears to be doing well in regard to his left lateral lower surety ulcer. He's been tolerating the dressing changes without complication. He does note that he is having no pain, no excessive drainage or discharge, and overall he feels like things are going about how he would expect and hope they would. Overall he seems to have no evidence of infection at this time in my opinion which is good news. 12/04/17-He is seen in follow-up evaluation  for right lateral lower extremity ulcer. He has been applying topical steroid cream. Today's measurement show slight increase in size. Over the next 2 weeks we will transition to every other day Santyl and steroid cream. He has been encouraged to monitor for changes and notify clinic with any concerns 12/15/17 on evaluation today patient's left lateral motion the ulcer and fortunately is doing worse again at this point. This just since last week to this week has close to doubled in size according to the patient. I did not seeing last week's I do not have a visual to compare this to in our system was also down so we do not have all the charts and at this point. Nonetheless it does have me somewhat concerned in regard to the fact that again he was worried enough about it he has contact the dermatology that placed them back on the full strength, 50 mg a day of the prednisone that he was taken previous. He continues to alternate using clobetasol along with Santyl at this point. He is obviously somewhat frustrated. 12/22/17 on evaluation today patient appears to be doing a little worse compared to last evaluation. Unfortunately the wound is a little deeper and slightly larger than the last week's evaluation. With that being said he has made some progress in regard to the irritation surrounding at this time unfortunately despite that progress that's been made he still has a significant issue going on here. 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 REHAAN, VILORIA ([id number]) For that reason I discussed with him today the possibility of perform a wound culture to ensure there's nothing overtly infected. 01/06/18 on evaluation today patient's wound is larger and deeper than previously evaluated. With that being said it did appear that his wound was infected after  my last evaluation with him. Subsequently I did end up prescribing a prescription for Bactrim DS which she has been taking and having no complication with. Fortunately there does not appear to be any evidence of infection at this point in time as far as anything spreading, no want to touch, and overall I feel like  things are showing signs of improvement. 01/13/18 on evaluation today patient appears to be even a little larger and deeper than last time. There still muscle exposed in the base of the wound. Nonetheless he does appear to be less erythematous I do believe inflammation is calming down also believe the infection looks like it's probably resolved at this time based on what I'm seeing. No fevers, chills, nausea, or vomiting noted at this time. 01/30/18 on evaluation today patient actually appears to visually look better for the most part. Unfortunately those visually this looks better he does seem to potentially have what may be an abscess in the muscle that has been noted in the central portion of the wound. This is the first time that I have noted what appears to be fluctuance in the central portion of the muscle. With that being said I'm somewhat more concerned about the fact that this might indicate an abscess formation at this location. I do believe that an ultrasound would be appropriate. This is likely something we need to try to do as soon as possible. He has been switch to mupirocin ointment and he is no longer using the steroid ointment as prescribed by dermatology he sees them again next week he's been decreased from 60 to 40 mg of prednisone. 03/09/18 on evaluation today patient actually appears to be doing a little better compared to last time I saw him. There's not as much erythema surrounding the wound itself. He I did review his most recent infectious disease note which was dated 02/24/18. He saw Dr. Michel Bickers in [place]. 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 Patient is now off of antibiotics and they are continually observing this. There seems to be no change in the past two weeks in my pinion based on what the patient says and what I see today compared to what Dr. Megan Salon likely saw two weeks ago. No fevers, chills, nausea, or vomiting noted at this time. 03/23/18 on evaluation today patient's wound actually appears to be showing signs of improvement which is good news. He is currently still on the Dapsone. He is also working on tapering the prednisone to get off of this and Dr. Lottie Rater is working with him in this regard. Nonetheless overall I feel like the wound is doing well it does appear based on the infectious disease note that I reviewed from Dr. 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 I did also review the results of his ultrasound of left lower extremity which revealed there was a dentist tissue in the base of the wound without an abscess noted. 04/06/18 on evaluation today the patient's left lateral lower extremity ulcer actually appears to be doing fairly well which is excellent news. There does not appear to be any evidence of infection at this time which is also great news. Overall he still does have a significantly large ulceration although little by little he seems to be making progress. He is down to 10 mg a day of the prednisone. 04/20/18 on evaluation today patient actually appears to be doing excellent at this time in regard to his left lower extremity ulcer. He's making signs of good progress unfortunately this is taking much longer than we would really like to see but nonetheless he is  making progress. Fortunately there does not appear to be any evidence of infection at this time. No fevers, chills, nausea, or vomiting noted at this time.  The patient has not been using the Santyl due to the cost he hadn't got in this field yet. He's mainly been using the antibiotic ointment topically. Subsequently he also tells me that he really has not been scrubbing in the shower I think this would be helpful again as I told him it doesn't have to be anything too aggressive to even make it believe just enough to keep it free of some of the loose slough and biofilm on the wound surface. 05/11/18 on evaluation today patient's wound appears to be making slow but sure progress in regard to the left lateral lower extremity ulcer. He is been tolerating the dressing changes without complication. Fortunately there does not appear to be any evidence of infection at this time. He is still just using triple antibiotic ointment along with clobetasol occasionally over the area. He never got the Santyl and really does not seem to intend to in my pinion. 06/01/18 on evaluation today patient appears to be doing a little better in regard to his left lateral lower extremity ulcer. He states that overall he does not feel like he is doing as well with the Dapsone as he did with the prednisone. Nonetheless he sees his dermatologist later today and is gonna talk to them about the possibility of going back on the prednisone. Overall again I believe that the wound would be better if you would utilize Santyl but he really does not seem to be interested in going back to the [place] at this point. He has been using triple antibiotic ointment. 06/15/18 on evaluation today patient's wound actually appears to be doing about the same at this point. Fortunately there is no signs of infection at this time. He has made slight improvements although he continues to not really want to clean the wound bed at this point. He states that he just doesn't mess with it he doesn't want to cause any problems with everything else he has going on. He has been on medication, antibiotics as prescribed  by his dermatologist, for a staff infection of his lower extremities which is really drying out now and looking much better he tells me. Fortunately there is no sign of overall infection. 06/29/18 on evaluation today patient appears to be doing well in regard to his left lateral lower surety ulcer all things considering. Fortunately his staff infection seems to be greatly improved compared to previous. He has no signs of infection and this is drying up quite nicely. He is still the doxycycline for this is no longer on cental, Dapsone, or any of the other medications. His dermatologist has recommended possibility of an infusion but right now he does not want to proceed with that. 07/13/18 on evaluation today patient appears to be doing about the same in regard to his left lateral lower surety ulcer. Fortunately there's no signs of infection at this time which is great news. Unfortunately he still builds up a significant amount of Slough/biofilm of the surface of the wound he still is not really cleaning this as he should be appropriately. Again I'm able to easily with saline and gauze remove the majority of this on the surface which if you would do this at home would likely be a dramatic improvement for him as far as getting the area to improve. Nonetheless overall I still feel like he  is making progress is just very slow. I think Santyl will be of benefit for him as well. Still he has not gotten this as of this point. 07/27/18 on evaluation today patient actually appears to be doing little worse in regards of the erythema around the periwound region of the wound he also tells me that he's been having more drainage currently compared to what he was experiencing last time I saw him. He states not quite as bad as what he had because this was infected previously but nonetheless is still appears to be doing poorly. Fortunately there is no evidence of systemic infection at this point. The patient tells me that  he is not going to be able to afford the Santyl. He is still waiting to hear about the infusion therapy with his dermatologist. Apparently she wants an updated colonoscopy first. 08/10/18 on evaluation today patient appears to be doing better in regard to his left lateral lower extremity ulcer. Fortunately he is showing signs of improvement in this regard he's actually been approved for Remicade infusion's as well although this has not been scheduled as of yet. Fortunately there's no signs of active infection at this time in regard to the wound although he is having some issues with infection of the right lower extremity is been seen as dermatologist for this. Fortunately they are definitely still working with him trying to keep things under control. EINAR, NOLASCO ([id number]) 09/07/18 on evaluation today patient is actually doing rather well in regard to his left lateral lower extremity ulcer. He notes these actually having some hair grow back on his extremity which is something he has not seen in years. He also tells me that the pain is really not giving them any trouble at this time which is also good news overall she is very pleased with the progress he's using a combination of the mupirocin along with the probate is all mixed. 09/21/18 on evaluation today patient actually appears to be doing fairly well all things considered in regard to his looks from the ulcer. He's been tolerating the dressing changes without complication. Fortunately there's no signs of active infection at this time which is good news he is still on all antibiotics or prevention of the staff infection. He has been on prednisone for time although he states it is gonna contact his dermatologist and see if she put them on a short course due to some irritation that he has going on currently. Fortunately there's no evidence of any overall worsening this is going very slow I think cental would be something that would be helpful for him  although he states that $50 for tube is quite expensive. He therefore is not willing to get that at this point. 10/06/18 on evaluation today patient actually appears to be doing decently well in regard to his left lateral leg ulcer. He's been tolerating the dressing changes without complication. Fortunately there's no signs of active infection at this time. Overall I'm actually rather pleased with the progress he's making although it's slow he doesn't show any signs of infection and he does seem to be making some improvement. I do believe that he may need a switch up and dressings to try to help this to heal more appropriately and quickly. 10/19/18 on evaluation today patient actually appears to be doing better in regard to his left lateral lower extremity ulcer. This is shown signs of having much less Slough buildup at this point due to the fact he has been using  the Santyl. Obviously this is very good news. The overall size of the wound is not dramatically smaller but again the appearance is. 11/02/18 on evaluation today patient actually appears to be doing quite well in regard to his lower [place] ulcer. A lot of the skin around the ulcer is actually somewhat irritating at this point this seems to be more due to the dressing causing irritation from the adhesive that anything else. Fortunately there is no signs of active infection at this time. 11/24/18 on evaluation today patient appears to be doing a little worse in regard to his overall appearance of his lower extremity ulcer. There's more erythema and warmth around the wound unfortunately. He is currently on doxycycline which he has been on for some time. With that being said I'm not sure that seems to be helping with what appears to possibly be an acute cellulitis with regard to his left lower extremity ulcer. No fevers, chills, nausea, or vomiting noted at this time. 12/08/18 on evaluation today patient's wounds actually appears to be doing  significantly better compared to his last evaluation. He has been using Santyl along with alternating tripling about appointment as well as the steroid cream seems to be doing quite well and the wound is showing signs of improvement which is excellent news. Fortunately there's no evidence of infection and in fact his culture came back negative with only normal skin flora noted. 12/21/2018 upon evaluation today patient actually appears to be doing excellent with regard to his ulcer. This is actually the best that I have seen it since have been helping to take care of him. It is both smaller as well as less slough noted on the surface of the wound and seems to be showing signs of good improvement with new skin growing from the edges. He has been using just the triamcinolone he does wonder if he can get a refill of that ointment today. 01/04/2019 upon evaluation today patient actually appears to be doing well with regard to his left lateral lower extremity ulcer. With that being said it does not appear to be that he is doing quite as well as last time as far as progression is concerned. There does not appear to be any signs of infection or significant irritation which is good news. With that being said I do believe that he may benefit from switching to a collagen based dressing based on how clean The wound appears. 01/18/2019 on evaluation today patient actually appears to be doing well with regard to his wound on the left lower extremity. He is not made a lot of progress compared to where we were previous but nonetheless does seem to be doing okay at this time which is good news. There is no signs of active infection which is also good news. My only concern currently is I do wish we can get him into utilizing the collagen dressing his insurance would not pay for the supplies that we ordered although it appears that he may be able to order this through his supply company that he typically utilizes. This is  [employer]. Nonetheless he did try to order it during the office visit today and it appears this did go through. We will see if he can get that it is a different brand but nonetheless he has collagen and I do think will be beneficial. 02/01/2019 on evaluation today patient actually appears to be doing a little worse today in regard to the overall size of his wounds. Fortunately there  is no signs of active infection at this time. That is visually. Nonetheless when this is happened before it was due to infection. For that reason were somewhat concerned about that this time as well. 02/08/2019 on evaluation today patient unfortunately appears to be doing slightly worse with regard to his wound upon evaluation today. Is measuring a little deeper and a little larger unfortunately. I am not really sure exactly what is causing this to enlarge he actually did see his dermatologist she is going to see about initiating Humira for him. Subsequently she also did do steroid injections into the wound itself in the periphery. Nonetheless still nonetheless he seems to be getting a little bit larger he is gone back to just using the steroid cream topically which I think is appropriate. I would say hold off on the collagen for the time being is definitely a good thing to do. Based on the culture results which we finally did get the final result back regarding it shows staph as the bacteria noted again that can be a normal skin bacteria based on the fact however he is having increased drainage and worsening of the wound measurement wise I would go ahead and place him on an antibiotic today I do believe for this. 02/15/2019 on evaluation today patient actually appears to be doing somewhat better in regard to his ulcer. There is no signs of worsening at this time I did review his culture results which showed evidence of Staphylococcus aureus but not MRSA. Again this could just be more related to the normal skin bacteria  although he states the drainage has slowed down quite a bit he may have had a mild infection not just colonization. And was much smaller and then since around10/04/2019 on evaluation today patient appears to be doing unfortunately worse as far as the size of the wound. I really feel like that this is steadily getting larger again it had been doing excellent right at the beginning of September we have seen a steady increase in the area of the wound it is almost 2-1/2 times the size it was on September 1. Obviously this is a bad trend this is not wanting to see. For that reason we went back to using just the topical triamcinolone cream which does seem to help with inflammation. I checked him for bacteria by way of culture and nothing showed positive there. I am considering giving him a short course of a tapering steroid Dosepak today to see if that is can be beneficial for him. The patient is in agreement with giving that a try. 03/08/2019 on evaluation today patient appears to be doing very well in comparison to last evaluation with regard to his lower extremity ulcer. This is showing signs of less inflammation and actually measuring slightly smaller compared to last time every other week over the past month and a half he has been measuring larger larger larger. Nonetheless I do believe that the issue has been inflammation the prednisone does seem to [facility] [place] ([id number]) have been beneficial for him which is good news. No fevers, chills, nausea, vomiting, or diarrhea. 03/22/2019 on evaluation today patient appears to be doing about the same with regard to his leg ulcer. He has been tolerating the dressing changes without complication. With that being said the wound seems to be mostly arrested at its current size but really is not making any progress except for when we prescribed the prednisone. He did show some signs of dropping as far as  the overall size of the wound during that interval week.  Nonetheless this is something he is not on long-term at this point and unfortunately I think he is getting need either this or else the Humira which his dermatologist has discussed try to get approval for. With that being said he will be seeing his dermatologist on the 11th of this month that is November. 04/19/2019 on evaluation today patient appears to be doing really about the same the wound is measuring slightly larger compared to last time I saw him. He has not been into the office since November 2 due to the fact that he unfortunately had Covid as that his entire family. He tells me that it was rough but they did pull-through and he seems to be doing much better. Fortunately there is no signs of active infection at this time. No fevers, chills, nausea, vomiting, or diarrhea. 05/10/2019 on evaluation today patient unfortunately appears to be doing significantly worse as compared to last time I saw him. He does tell me that he has had his first dose of Humira and actually is scheduled to get the next one in the upcoming week. With that being said he tells me also that in the past several days he has been having a lot of issues with green drainage she showed me a picture this is more blue-green in color. He is also been having issues with increased sloughy buildup and the wound does appear to be larger today. Obviously this is not the direction that we want everything to take based on the starting of his Humira. Nonetheless I think this is definitely a result of likely infection and to be honest I think this is probably Pseudomonas causing the infection based on what I am seeing. 05/24/2019 on evaluation today patient unfortunately appears to be doing significantly worse compared to his prior evaluation with me 2 weeks ago. I did review his culture results which showed that he does have Staph aureus as well as Pseudomonas noted on the culture. Nonetheless the Levaquin that I prescribed for him does  not appear to have been appropriate and in fact he tells me he is no longer experiencing the green drainage and discharge that he had at the last visit. Fortunately there is no signs of active infection at this time which is good news although the wound has significantly worsened it in fact is much deeper than it was previous. We have been utilizing up to this point triamcinolone ointment as the prescription topical of choice but at this time I really feel like that the wound is getting need to be packed in order to appropriately manage this due to the deeper nature of the wound. Therefore something along the lines of an alginate dressing may be more appropriate. 05/31/2019 upon inspection today patient's wound actually showed signs of doing poorly at this point. Unfortunately he just does not seem to be making any good progress despite what we have tried. He actually did go ahead and pick up the Cipro and start taking that as he was noticing more green drainage he had previously completed the Levaquin that I prescribed for him as well. Nonetheless he missed his appointment for the seventh last week on Wednesday with the wound care center and [facility] where his dermatologist referred him. Obviously I do think a second opinion would be helpful at this point especially in light of the fact that the patient seems to be doing so poorly despite the fact  that we have tried everything that I really know how at this point. The only thing that ever seems to have helped him in the past is when he was on high doses of continual steroids that did seem to make a difference for him. 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 06/07/2019 upon evaluation today patient unfortunately appears to be doing worse yet again with regard to his wound. In fact I am  starting to question whether or not he may have a fluid pocket in the muscle at this point based on the bulging and the soft appearance to the central portion of the muscle area. There is not anything draining from the muscle itself at this time which is good news but nonetheless the wound is expanding. I am not really seeing any results of the Humira as far as overall wound progression based on what I am seeing at this point. The patient has been referred for second opinion with regard to his wound to the [facility] wound care center by his dermatologist which I definitely am not in opposition to. Unfortunately we tried multiple dressings in the past including collagen, alginate, and at one point even Hydrofera Blue. With that being said he is never really used it for any significant amount of time due to the fact that he often complains of pain associated with these dressings and then will go back to either using the Santyl which she has done intermittently or more frequently the triamcinolone. He is also using his own compression stockings. We have wrapped him in the past but again that was something else that he really was not a big fan of. 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 He seemed to do much better when he was on the steroids although honestly I understand that the steroids are not necessarily the best medication to be on long-term obviously 06/14/2019 on evaluation today patient appears to be doing actually a little bit better with regard to the overall appearance with his leg. Unfortunately he does continue to have issues with what appears to be some fluid underneath the  muscle although he did see the wound specialty center at [facility] last week their main goals were to see about infusion therapy in place of the Humira as they feel like that is not quite strong enough. They also recommended that we continue with the treatment otherwise as we are they felt like that was appropriate and they are okay with him continuing to follow-up here with [place] in that regard. With that being said they are also sending him to the vein specialist there to see about vein stripping and if that would be of benefit for him. Subsequently they also did not really address whether or not an ultrasound of the muscle area to see if there is anything that needs to be addressed here would be appropriate or not. For that reason I discussed this with him last week I think we  may proceed down that road at this point. 06/21/2019 upon evaluation today patient's wound actually appears to be doing slightly better compared to previous evaluations. I do believe that he has made a difference with regard to the progression here with the use of oral steroids. Again in the past has been the only thing that is really calm things down. He does tell me that from [facility] is gotten a good news from there that there are no further vein stripping that is necessary at this point. I do not have that available for review today although the patient did relay this to me. He also did obtain and have the ultrasound of the wound completed which I did sign off on today. It does appear that there is no fluid collection under the muscle this is likely then just edematous tissue in general. That is also good news. Overall I still believe the inflammation is the main issue here. He did inquire about the possibility of a wound VAC again with the muscle protruding like it is I am not really sure whether the wound VAC is necessarily ideal or not. That is something we will have to consider although I do believe he may need compression wrapping to  try to help with edema control which could potentially be of benefit. 06/28/2019 on evaluation today patient appears to be doing slightly better measurement wise although this is not terribly smaller he least seems to be trending towards that direction. With that being said he still seems to have purulent drainage noted in the wound bed at this time. He has been on Levaquin followed by Cipro over the past month. Unfortunately he still seems to have some issues with active infection at this time. I did perform a culture last week in order to evaluate and see if indeed there was still anything going on. Subsequently the culture did come back showing Pseudomonas which is consistent with the drainage has been having which is blue-green in color. He also has had an odor that again was somewhat consistent with Pseudomonas as well. Long story short it appears that the culture showed an intermediate finding with regard to how well the Cipro will work for the Pseudomonas infection. Subsequently being that he does not seem to be clearing up and at best what we are doing is just keeping this at [place] I think he may need to see infectious disease to discuss IV antibiotic options. PAYDEN, DOCTER ([id number]) 07/05/2019 upon evaluation today patient appears to be doing okay in regard to his leg ulcer. He has been tolerating the dressing changes at this point without complication. Fortunately there is no signs of active infection at this time which is good news. No fevers, chills, nausea, vomiting, or diarrhea. With that being said he does have an appointment with infectious disease tomorrow and his primary care on Wednesday. Again the reason for the infectious disease referral was due to the fact that he did not seem to be fully resolving with the use of oral antibiotics and therefore we were thinking that IV antibiotic therapy may be necessary secondary to the fact that there was an intermediate finding for  how effective the Cipro may be. Nonetheless again he has been having a lot of purulent and even green drainage. Fortunately right now that seems to have calmed down over the past week with the reinitiation of the oral antibiotic. Nonetheless we will see what Dr. Megan Salon has to say. 07/12/2019 upon evaluation today patient appears to be  doing about the same at this point in regard to his left lower extremity ulcer. Fortunately there is no signs of active infection at this time which is good news I do believe the Levaquin has been beneficial I did review Dr. Hale Bogus note and to be honest I agree that the patient's leg does appear to be doing better currently. What we found in the past as he does not seem to really completely resolve he will stop the antibiotic and then subsequently things will revert back to having issues with blue-green drainage, increased pain, and overall worsening in general. Obviously that is the reason I sent him back to infectious disease. 07/19/2019 upon evaluation today patient appears to be doing roughly the same in size there is really no dramatic improvement. He has started back on the Levaquin at this point and though he seems to be doing okay he did still have a lot of blue/green drainage noted on evaluation today unfortunately. I think that this is still indicative more likely of a Pseudomonas infection as previously noted and again he does see Dr. Megan Salon in just a couple of days. I do not know that were really able to effectively clear this with just oral antibiotics alone based on what I am seeing currently. Nonetheless we are still continue to try to manage as best we can with regard to the patient and his wound. I do think the wrap was helpful in decreasing the edema which is excellent news. No fevers, chills, nausea, vomiting, or diarrhea. 07/26/2019 upon evaluation today patient appears to be doing slightly better with regard to the overall appearance of the muscle  there is no dark discoloration centrally. Fortunately there is no signs of active infection at this time. No fevers, chills, nausea, vomiting, or diarrhea. Patient's wound bed currently the patient did have an appointment with Dr. Megan Salon at infectious disease last week. With that being said Dr. Megan Salon the patient states was still somewhat hesitant about put him on any IV antibiotics he wanted [place] to repeat cultures today and then see where things go going forward. He does look like Dr. Megan Salon because of some improvement the patient did have with the Levaquin wanted [place] to see about repeating cultures. If it indeed grows the Pseudomonas again then he recommended a possibility of considering a PICC line placement and IV antibiotic therapy. He plans to see the patient back in 1 to 2 weeks. 08/02/2019 upon evaluation today patient appears to be doing poorly with regard to his left lower extremity. We did get the results of his culture back it shows that he is still showing evidence of Pseudomonas which is consistent with the purulent/blue-green drainage that he has currently. Subsequently the culture also shows that he now is showing resistance to the oral fluoroquinolones which is unfortunate as that was really the only thing to treat the infection prior. I do believe that he is looking like this is going require IV antibiotic therapy to get this under control. Fortunately there is no signs of systemic infection at this time which is good news. The patient does see Dr. Megan Salon tomorrow. 08/09/2019 upon evaluation today patient appears to be doing better with regard to his left lower extremity ulcer in regard to the overall appearance. He is currently on IV antibiotic therapy. As ordered by Dr. Megan Salon. Currently the patient is on ceftazidime which she is going to take for the next 2 weeks and then follow-up for 4 to 5-week appointment with Dr. Megan Salon.  The patient started this this past Friday  symptoms have not for a total of 3 days currently in full. 08/16/2019 upon evaluation today patient's wound actually does show muscle in the base of the wound but in general does appear to be much better as far as the overall evidence of infection is concerned. In fact I feel like this is for the most part cleared up he still on the IV antibiotics he has not completed the full course yet but I think he is doing much better which is excellent news. 08/23/2019 upon evaluation today patient appears to be doing about the same with regard to his wound at this point. He tells me that he still has pain unfortunately. Fortunately there is no evidence of systemic infection at this time which is great news. There is significant muscle protrusion. 09/13/19 upon evaluation today patient appears to be doing about the same in regard to his leg unfortunately. He still has a lot of drainage coming from the ulceration there is still muscle exposed. With that being said the patient's last wound culture still showed an intermediate finding with regard to the Pseudomonas he still having the bluish/green drainage as well. Overall I do not know that the wound has completely cleared of infection at this point. Fortunately there is no signs of active infection systemically at this point which is good news. 09/20/2019 upon evaluation today patient's wound actually appears to be doing about the same based on what I am seeing currently. I do not see any signs of systemic infection he still does have evidence of some local infection and drainage. He did see Dr. Megan Salon last week and Dr. Megan Salon states that he probably does need a different IV antibiotic although he does not want to put him on this until the patient begins the Remicade infusion which is actually scheduled for about 10 days out from today on 13 May. Following that time Dr. Megan Salon is good to see him back and then will evaluate the feasibility of starting him on the  IV antibiotic therapy once again at that point. I do not disagree with this plan I do believe as Dr. Megan Salon stated in his note that I reviewed today that the patient's issue is multifactorial with the pyoderma being 1 aspect of this that were hoping the Remicade will be helpful for her. In the meantime I think the gentamicin is, helping to keep things under decent okay control in regard to the ulcer. 09/27/2019 upon evaluation today patient appears to be doing about the same with regard to his wound still there is a lot of muscle exposure though he does have some hyper granulation tissue noted around the edge and actually some granulation tissue starting to form over the muscle which is actually good news. Fortunately there is no evidence of active infection which is also good news. His pain is less at this point. 5/21; this is a patient I have not seen in a long time. 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 He has a large wound on the left lateral leg with protruding muscle. He comes in the clinic today showing the same area on his left medial ankle. He says there is been a spot there for some time although we have not previously defined this. Today he has a clearly defined area with slight amount of skin breakdown surrounded by raised areas with a purplish hue in color. This is not painful he says it is irritated.  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 He still has muscle protruding from the lateral portion of his left leg but fortunately the new area that they were concerned about at his last visit does not appear to have opened at this point. He is currently on Remicade infusions and seems to be doing better in my opinion in fact the wound itself seems to be overall much better. The purplish discoloration that he did have seems to have resolved  and I think that is a good sign that hopefully the Remicade is doing its job. He does have some biofilm noted over the surface of the wound. 11/01/2019 on evaluation today patient's wound actually appears to be doing excellent at this time. Fortunately there is no evidence of active infection and overall I feel like he is making great progress. The Remicade seems to be due excellent job in my opinion. CASSIDY, TABET ([id number]) 11/08/19 evaluation today vision actually appears to be doing quite well with regard to his weight ulcer. He's been tolerating dressing changes without complication. Fortunately there is no evidence of infection. No fevers, chills, nausea, or vomiting noted at this time. Overall states that is having more itching than pain which is actually a good sign in my opinion. 12/13/2019 upon evaluation today patient appears to be doing well today with regard to his wound. He has been tolerating the dressing changes without complication. Fortunately there is no sign of active infection at this time. No fevers, chills, nausea, vomiting, or diarrhea. Overall I feel like the infusion therapy has been very beneficial for him. 01/06/2020 on evaluation today patient appears to be doing well with regard to his wound. This is measuring smaller and actually looks to be doing better. Fortunately there is no signs of active infection at this point. No fevers, chills, nausea, vomiting, or diarrhea. With that being said he does still have the blue-green drainage but this does not seem to be causing any significant issues currently. He has been using the gentamicin that does seem to be keeping things under decent control at this point. He goes later this morning for his next infusion therapy for the pyoderma which seems to also be very beneficial. 02/07/2020 on evaluation today patient appears to be doing about the same in regard to his wounds currently. Fortunately there is no signs of active infection  systemically he does still have evidence of local infection still using gentamicin. He also is showing some signs of improvement albeit slowly I do feel like we are making some progress here. 02/21/2020 upon evaluation today patient appears to be making some signs of improvement the wound is measuring a little bit smaller which is great news and overall I am very pleased with where he stands currently. He is going to be having infusion therapy treatment on the 15th of this month. Fortunately there is no signs of active infection at this time. 03/13/2020 I do believe patient's wound is actually showing some signs of improvement here which is great news. He has continue with the infusion therapy through rheumatology/dermatology at [facility]. That does seem to be beneficial. I still think he gets as much benefit from this as he did from the prednisone initially but nonetheless obviously this is less harsh on his body that the prednisone as far as they are concerned. 03/31/2020 on evaluation today patient's wound actually showing signs of some pretty good improvement in regard  to the overall appearance of the wound bed. There is still muscle exposed though he does have some epithelial growth around the edges of the wound. Fortunately there is no signs of active infection at this time. No fevers, chills, nausea, vomiting, or diarrhea. 04/24/2020 upon evaluation today patient appears to be doing about the same in regard to his leg ulcer. He has been tolerating the dressing changes without complication. Fortunately there is no signs of active infection at this time. No fevers, chills, nausea, vomiting, or diarrhea. With that being said he still has a lot of irritation from the bandaging around the edges of the wound. We did discuss today the possibility of a referral to plastic surgery. 05/22/2020 on evaluation today patient appears to be doing well with regard to his wounds all things considered. He has not been able  to get the Chantix apparently there is a recall nurse that I was unaware of put out by [employer] involuntarily. Nonetheless for now I am and I have to do some research into what may be the best option for him to help with quitting in regard to smoking and we discussed that today. 06/26/2020 upon evaluation today patient appears to be doing well with regard to his wound from the standpoint of infection I do not see any signs of infection at this point. With that being said unfortunately he is still continuing to have issues with muscle exposure and again he is not having a whole lot of new skin growth unfortunately. There does not appear to be any signs of active infection at this time. No fevers, chills, nausea, vomiting, or diarrhea. 07/10/2020 upon evaluation today patient appears to be doing a little bit more poorly currently compared to where he was previous. I am concerned currently about an active infection that may be getting worse especially in light of the increased size and tenderness of the wound bed. No fevers, chills, nausea, vomiting, or diarrhea. 07/24/2020 upon evaluation today patient appears to be doing poorly in regard to his leg ulcer. He has been tolerating the dressing changes without complication but unfortunately is having a lot of discomfort. 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 Subsequently I think he is going require possibly IV antibiotics to get this under control. I am very concerned about the severity of his infection and the amount of discomfort he is having. 07/31/2020 upon evaluation today patient appears to be doing about the same in regard to his leg wound. He did see Dr. Megan Salon and Dr. Megan Salon is actually going to start him on IV antibiotics. He goes for the PICC line tomorrow. With that being said there do not have that run for 2 weeks and then see how things are doing and depending on how he  is progressing they may extend that a little longer. Nonetheless I am glad this is getting ready to be in place and definitely feel it may help the patient. In the meantime is been using mainly triamcinolone to the wound bed has an anti-inflammatory. 08/07/2020 on evaluation today patient appears to be doing well with regard to his wound compared even last week. In the interim he has gotten the PICC line placed and overall this seems to be doing excellent. There does not appear to be any evidence of infection which is great news systemically although locally of course has had the infection this appears to be improving with the use of the antibiotics. 08/14/2020 upon  evaluation today patient's wound actually showing signs of excellent improvement. Overall the irritation has significantly improved the drainage is back down to more of a normal level and his pain is really pretty much nonexistent compared to what it was. Obviously I think that this is significantly improved secondary to the IV antibiotic therapy which has made all the difference in the world. Again he had a resistant form of Pseudomonas for which oral antibiotics just was not cutting it. Nonetheless I do think that still we need to consider the possibility of a surgical closure for this wound is been open so long and to be honest with muscle exposed I think this can be very hard to get this to close outside of this although definitely were still working to try to do what we can in that regard. 08/21/2020 upon evaluation today patient appears to be doing very well with regard to his wounds on the left lateral lower extremity/calf area. Fortunately there does not appear to be signs of active infection which is great news and overall very pleased with where things stand today. He is actually wrapping up his treatment with IV antibiotics tomorrow. After that we will see where things go from there. 08/28/2020 upon evaluation today patient appears  to be doing decently well with regard to his leg ulcer. There does not appear to be any signs of active infection which is great news and overall very pleased with where things stand today. No fevers, chills, nausea, vomiting, or diarrhea. 09/18/2020 upon evaluation today patient appears to be doing well with regard to his infection which I feel like is better. Unfortunately he is not doing as well with regard to the overall size of the wound which is not nearly as good at this point. I feel like that he may be having an issue here with the pyoderma being somewhat out of control. I think that he may benefit from potentially going back and talking to the dermatologist about JOHATHON, OVERTURF ([id number]) what to do from the pyoderma standpoint. I am not certain if the infusions are helping nearly as much is what the prednisone did in the past. 10/02/2020 upon evaluation today patient appears to be doing well with regard to his leg ulcer. He did go to the [facility]iatric nurse. Unfortunately they feel like there is a 10% chance that most that he would be able to heal and that the skin graft would take. Obviously this has led him to not be able to go down that path as far as treatment is concerned. Nonetheless he does seem to be doing a little bit better with the prednisone that I gave him last time. I think that he may need to discuss with dermatology the possibility of long-term prednisone as that seems to be what is most helpful for him to be perfectly honest. I am not sure the Remicade is really doing the job. 10/17/2020 upon evaluation today patient appears to be doing a little better in regard to his wound. In fact the case has been since we did the prednisone on May 2 for him that we have noticed a little bit of improvement each time we have seen a size wise as well as appearance wise as well as pain wise. I think the prednisone has had a greater effect then the infusion therapy has to be perfectly honest. With  that being said the patient also feels significantly better compared to what he was previous. All of this is good news but  nonetheless I am still concerned about the fact that again we are really not set up to long-term manage him as far as prednisone is concerned. Obviously there are things that you need to be watched I completely understand the risk of prednisone usage as well. 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 With all that being said I do believe that we can give him another round of the prednisone which she is requesting today because of the improvement that he seen since we did that first round. 10/30/2020 upon evaluation today patient's wound actually is showing signs of doing quite well. There does not appear to be any evidence of infection which is great news and overall very pleased with where things stand today. No fevers, chills, nausea, vomiting, or diarrhea. He tells me that the prednisone still has seem to have helped he wonders if we can extend that for just a little bit longer. He did not have the appointment with a dermatologist although he did have an infusion appointment last Friday. That was at [facility]. With that being said he tells me he could not do both that as well as the appointment with the physician on the same day therefore that is can have to be rescheduled. I really want to see if there is anything they feel like that could be done differently to try to help this out as I am not really certain that the infusions are helping significantly here. 11/13/2020 upon evaluation today patient unfortunately appears to be doing somewhat poorly in regard to his wound I feel like this is actually worsening from the standpoint of the pyoderma spreading. I still feel like that he may need something different as far as trying to manage this going forward. Again we did the prednisone unfortunately his blood sugars are not doing so well  because of this. Nonetheless I believe that the patient likely needs to try topical steroid. We have done triamcinolone for a while I think going with something stronger such as clobetasol could be beneficial again this is not something I do lightly I discussed this with the patient that again this does not normally put underneath an occlusive dressing. Nonetheless I think a thin film as such could help with some of the stronger anti-inflammatory effects. We discussed this today. He would like to try to give this a trial for the next couple weeks. I definitely think that is something that we can do. Evaluate7/03/2021 and today patient's wound bed actually showed signs of doing really about the same. There was a little expansion of the size of the wound and that leading edge that we done looking out although the clobetasol does seem to have slowed this down a bit in my opinion. There is just 1 small area that still seems to be progressing based on what I see. Nonetheless I am concerned about the fact this does not seem to be improving if anything seems to be doing a little bit worse. I do not know that the infusions are really helping him much as next infusion is August 5 his appointment with dermatology is July 25. Either way I really think that we need to have a conversation potentially about this and I am actually going to see if I can talk with Dr. Lillia Carmel in order to see where things stand as well. 12/11/2020 upon evaluation today patient appears to be doing worse in regard to his leg ulcer. Unfortunately  I just do not think this is making the progress that I would like to see at this point. Honestly he does have an appointment with dermatology and this is in 2 days. I am wondering what they may have to offer to help with this. Right now what I am seeing is that he is continuing to show signs of worsening little by little. Obviously that is not great at all. Is the exact opposite of what we are  looking for. 12/18/2020 upon evaluation today patient appears to be doing a little better in regard to his wound. The dermatologist actually did do some steroid injections into the wound which does seem to have been beneficial in my opinion. That was on the 25th already this looks a little better to me than last time I saw him. With that being said we did do a culture and this did show that he has Staph aureus noted in abundance in the wound. With that being said I do think that getting him on an oral antibiotic would be appropriate as well. Also think we can compression wrap and this will make a difference as well. 12/28/2020 upon evaluation today patient's wound is actually showing signs of doing much better. I do believe the compression wrap is helping he has a lot of drainage but to be honest I think that the compression is helping to some degree in this regard as well as not draining through which is also good news. No fevers, chills, nausea, vomiting, or diarrhea. 01/04/2021 upon evaluation today patient appears to be doing well with regard to his wound. Overall things seem to be doing quite well. He did have a little bit of reaction to the CarboFlex Sorbact he will be using that any longer. With that being said he is controlled as far as the drainage is concerned overall and seems to be doing quite well. I do not see any signs of active infection at this time which is great news. No fevers, chills, nausea, vomiting, or diarrhea. 01/11/2021 upon evaluation today patient appears to be doing well with regard to his wounds. He has been tolerating the dressing changes without complication. Fortunately there does not appear to be any signs of active infection at this time which is great news. Overall I am extremely pleased with where we stand currently. No fevers, chills, nausea, vomiting, or diarrhea. Where using clobetasol in the wound bed he has a lot of new skin growth which is awesome as  well. 01/18/2021 upon evaluation today patient appears to be doing very well in regard to his leg ulcer. He has been tolerating the dressing changes without complication. Fortunately there does not appear to be any signs of active infection which is great news. In general I think that he is making excellent progress 01/25/2021 upon evaluation today patient appears to be doing well with regard to his wound on the leg. I am actually extremely pleased with where things stand today. There does not appear to be any signs of active infection which is great news and overall I think that we are definitely headed in the appropriate direction based on what I am seeing currently. There does not appear to be any signs of active infection also excellent news. 02/06/2021 upon evaluation today patient appears to be doing well with regard to his wound. Overall visually this is showing signs of significant improvement which is great news. I do not see any signs of active infection systemically which is great even locally  I do not think that we are seeing any major complications here. We did do fluorescence imaging with the [employer] today. The patient does have some odor and drainage noted and again this is something that I think would benefit him to probably come more frequently for nurse visits. 02/19/2021 upon evaluation today patient actually appears to be doing quite well in regard to his wound. He has been tolerating the dressing Steven, Polo ([id number]) changes without complication and overall I think that this is making excellent progress. I do not see any evidence of active infection at this point which is great news as well. No fevers, chills, nausea, vomiting, or diarrhea. 10/10; wound is made nice progress healthy granulation with a nice rim of epithelialization which seems to be expanding even from last week he has a deeper area in the inferior part of the more distal part of the wound with not quite as  healthy as surface. This area will need to be followed. Using clobetasol and Hydrofera Blue 03/05/2021 upon evaluation today patient appears to be doing very well in regard to his leg ulcer. He has been tolerating dressing changes without complication. Fortunately there does not appear to be any signs of active infection which is great news and overall I am extremely pleased with where we stand currently. 03/12/2021 upon evaluation today patient appears to be doing well with regard to his wound in fact this is extremely extremely good based on what we are seeing today there does not appear to be any signs of active infection and overall I think that he is doing awesome from the standpoint of healing in general. I am extremely pleased with how things seem to be progressing with regard to this pyoderma. Clobetasol has done wonders for him. I think the compression wrapping has also been of great benefit. 03/19/2021 upon evaluation today patient appears to be doing well with regard to his wound. He is tolerating the dressing changes without complication. In fact I feel like that he is actually making excellent progress at this point based on what I am seeing. No fevers, chills, nausea, vomiting, or diarrhea. Electronic Signature(s) Signed: 03/19/2021 9:14:19 AM By: Worthy Keeler PA-C Entered By: Worthy Keeler on 03/19/2021 09:14:19 SONY, SCHLARB ([id number]) -------------------------------------------------------------------------------- Physical Exam Details Patient Name: [name] Date of Service: 03/19/2021 8:15 AM Medical Record Number: [id number] Patient Account Number: [id number] Date of Birth/Sex: August 21, 1980 (40 y.o. M) Treating RN: [name] Primary Care Provider: [name] Other Clinician: Referring Provider: [name] Treating Provider/Extender: [name] Cliche in Treatment: 87 Constitutional Well-nourished and well-hydrated in no acute  distress. Respiratory normal breathing without difficulty. Psychiatric this patient is able to make decisions and demonstrates good insight into disease process. Alert and Oriented x 3. pleasant and cooperative. Notes Upon inspection patient's wound bed actually showed signs of good granulation and epithelization at this point. Fortunately there does not appear to be any signs of active infection systemically either which is also good news. No fevers, chills, nausea, vomiting, or diarrhea. Electronic Signature(s) Signed: 03/19/2021 9:14:38 AM By: Worthy Keeler PA-C Entered By: Worthy Keeler on 03/19/2021 09:14:38 ZYON, ROSSER ([id number]) -------------------------------------------------------------------------------- Physician Orders Details Patient Name: [name] Date of Service: 03/19/2021 8:15 AM Medical Record Number: [id number] Patient Account Number: [id number] Date of Birth/Sex: 05-17-1981 (40 y.o. 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 o Nurse Visit as needed - twice a week Bathing/ Shower/ Hygiene o Clean wound with Normal Saline or wound cleanser. o Other: - Apply dakins soaked gauze on wound after cleansing wound IN OFFICE ONLY Edema Control - Lymphedema / Segmental Compressive Device / Other o Optional: One layer of unna paste to top of  compression wrap (to act as an anchor). o Elevate, Exercise Daily and Avoid Standing for Long Periods of Time. o Elevate legs to the level of the heart and pump ankles as often as possible o Elevate leg(s) parallel to the floor when sitting. Wound Treatment Wound #1 - Lower Leg Wound Laterality: Left, Lateral Cleanser: Wound Cleanser 3 x Per Week/30 Days Discharge Instructions: Wash your hands with soap and water. Remove old dressing, discard into plastic bag and place into trash. Cleanse the wound with Wound Cleanser prior to applying a clean dressing using gauze sponges, not tissues or cotton balls. Do not scrub or use excessive force. Pat dry using gauze sponges, not tissue or cotton balls. Peri-Wound Care: Moisturizing Lotion 3 x Per Week/30 Days Discharge Instructions: Suggestions: Theraderm, Eucerin, Cetaphil, or patient preference. Topical: Clobetasol Propionate ointment 0.05%, 60 (g) tube 3 x Per Week/30 Days Discharge Instructions: Apply to 7 o'clock depth Primary Dressing: Hydrofera Blue Ready Transfer Foam, 4x5 (in/in) 3 x Per Week/30 Days Discharge Instructions: Apply Hydrofera Blue Ready to wound bed as directed Secondary Dressing: Xtrasorb Medium 4x5 (in/in) 3 x Per Week/30 Days Discharge Instructions: Apply to wound as directed. Do not cut. Compression Wrap: Medichoice 4 layer Compression System, 35-40 mmHG (Generic) 3 x Per Week/30 Days Discharge Instructions: Apply multi-layer wrap as directed. Electronic Signature(s) Signed: 03/19/2021 3:58:49 PM By: Worthy Keeler PA-C Signed: 03/19/2021 6:56:11 PM By: Carlene Coria RN Entered By: Carlene Coria on 03/19/2021 08:31:07 SAURABH, HETTICH ([id number]) -------------------------------------------------------------------------------- Problem List Details Patient Name: [name] Date of Service: 03/19/2021 8:15 AM Medical Record Number: [id number] Patient Account Number: [id number] Date of Birth/Sex: 25-Sep-1980 (40 y.o.  M) Treating RN: [name] Primary Care Provider: [name] Other Clinician: Referring Provider: [name] Treating Provider/Extender: [name] Cliche in Treatment: 223 Active Problems ICD-10 Encounter Code Description Active Date MDM Diagnosis L97.222 Non-pressure chronic ulcer of left calf with fat layer exposed 12/04/2016 No Yes L88 Pyoderma gangrenosum 03/26/2017 No Yes L97.321 Non-pressure chronic ulcer of left ankle limited to breakdown of skin 10/08/2019 No Yes I87.2 Venous insufficiency (chronic) (peripheral) 12/04/2016 No Yes L03.116 Cellulitis of left lower limb 05/24/2019 No Yes F17.208 Nicotine dependence, unspecified, with other nicotine-induced disorders 04/24/2020 No Yes Inactive Problems ICD-10 Code Description Active Date Inactive Date L97.213 Non-pressure chronic ulcer of right calf with necrosis of muscle 04/02/2017 04/02/2017 Resolved Problems Electronic Signature(s) Signed: 03/19/2021 8:26:18 AM By: Worthy Keeler PA-C Entered By: Worthy Keeler on 03/19/2021 08:26:17 [name] ([id number]) -------------------------------------------------------------------------------- Progress Note Details Patient Name: [name] Date of Service: 03/19/2021 8:15 AM Medical Record Number: [id number] Patient Account Number: [id number] Date of Birth/Sex: July 19, 1980 (40 y.o. M) Treating RN: [name] Primary Care Provider: [name] Other Clinician: Referring Provider: [name] Treating Provider/Extender: [name] Cliche in Treatment: 223 Subjective Chief Complaint Information obtained from  Patient He is here in follow up evaluation for LLE pyoderma ulcer History of Present Illness (HPI) 12/04/16; 40 year old man who comes into the clinic today for review of a wound on the posterior left calf. He tells me that is been there for about a year. He is not a diabetic he does smoke half a pack per day. He was seen in the ER on 11/20/16 felt to have  cellulitis around the wound and was given clindamycin. An x-ray did not show osteomyelitis. The patient initially tells me that he has a milk allergy that sets off a pruritic itching rash on his lower legs which she scratches incessantly and he thinks that's what may have set up the wound. He has been using various topical antibiotics and ointments without any effect. He works in a [employer] and is on his feet all day. He does not have a prior history of wounds however he does have the rash on both lower legs the right arm and the ventral aspect of his left arm. These are excoriations and clearly have had scratching however there are of macular looking areas on both legs including a substantial larger area on the right leg. This does not have an underlying open area. There is no blistering. The patient tells me that 2 years ago in [place] in response to the rash on his legs he saw a dermatologist who told him he had a condition which may be pyoderma gangrenosum although I may be putting words into his mouth. He seemed to recognize this. On further questioning he admits to a 5 year history of quiesced. ulcerative colitis. He is not in any treatment for this. He's had no recent travel 12/11/16; the patient arrives today with his wound and roughly the same condition we've been using silver alginate this is a deep punched out wound with some surrounding erythema but no tenderness. Biopsy I did did not show confirmed pyoderma gangrenosum suggested nonspecific inflammation and vasculitis but does not provide an actual description of what was seen by the pathologist. I'm really not able to understand this We have also received information from the patient's dermatologist in [place] notes from April 2016. This was a doctor Agarwal-antal. 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 He was prescribed topical steroid high potency under occlusion which helped but at this point the patient did not have  a deep punched out wound. 12/18/16; the patient's wound is larger in terms of surface area however this surface looks better and there is less depth. The surrounding erythema also is better. The patient states that the wrap we put on came off 2 days ago when he has been using his compression stockings. He we are in the process of getting a dermatology consult. 12/26/16 on evaluation today patient's left lower extremity wound shows evidence of infection with surrounding erythema noted. He has been tolerating the dressing changes but states that he has noted more discomfort. There is a larger area of erythema surrounding the wound. No fevers, chills, nausea, or vomiting noted at this time. With that being said the wound still does have slough covering the surface. He is not allergic to any medication that he is aware of at this point. In regard to his right lower extremity he had several regions that are erythematous and pruritic he wonders if there's anything we can do to help that. 01/02/17 I reviewed patient's wound culture which was obtained his visit last week. He was placed on doxycycline at  that point. Unfortunately that does not appear to be an antibiotic that would likely help with the situation however the pseudomonas noted on culture is sensitive to Cipro. Also unfortunately patient's wound seems to have a large compared to last week's evaluation. Not severely so but there are definitely increased measurements in general. He is continuing to have discomfort as well he writes this to be a seven out of 10. In fact he would prefer me not to perform any debridement today due to the fact that he is having discomfort and considering he has an active infection on the little reluctant to do so anyway. No fevers, chills, nausea, or vomiting noted at this time. 01/08/17; patient seems dermatology on September 5. I suspect dermatology will want the slides from the biopsy I did sent to their pathologist. I'm  not sure if there is a way we can expedite that. In any case the culture I did before I left on vacation 3 weeks ago showed Pseudomonas he was given 10 days of Cipro and per her description of her intake nurses is actually somewhat better this week although the wound is quite a bit bigger than I remember the last time I saw this. He still has 3 more days of Cipro 01/21/17; dermatology appointment tomorrow. He has completed the ciprofloxacin for Pseudomonas. Surface of the wound looks better however he is had some deterioration in the lesions on his right leg. Meantime the left lateral leg wound we will continue with sample 01/29/17; patient had his dermatology appointment but I can't yet see that note. He is completed his antibiotics. The wound is more superficial but considerably larger in circumferential area than when he came in. This is in his left lateral calf. He also has swollen erythematous areas with superficial wounds on the right leg and small papular areas on both arms. There apparently areas in her his upper thighs and buttocks I did not look at those. Dermatology biopsied the right leg. Hopefully will have their input next week. 02/05/17; patient went back to see his dermatologist who told him that he had a "scratching problem" as well as staph. He is now on a 30 day course of doxycycline and I believe she gave him triamcinolone cream to the right leg areas to help with the itching [not exactly sure but probably triamcinolone]. She apparently looked at the left lateral leg wound although this was not rebiopsied and I think felt to be ultimately part of the same pathogenesis. He is using sample border foam and changing nevus himself. He now has a new open area on the right posterior leg which was his biopsy site I don't have any of the dermatology notes 02/12/17; we put the patient in compression last week with SANTYL to the wound on the left leg and the biopsy. Edema is much better and  the depth of the wound is now at level of skin. Area is still the same Biopsy site on the right lateral leg we've also been using santyl with a border foam dressing and he is changing this himself. 02/19/17; Using silver alginate started last week to both the substantial left leg wound and the biopsy site on the right wound. He is tolerating compression well. Has a an appointment with his primary M.D. tomorrow wondering about diuretics although I'm wondering if the edema problem is actually lymphedema PATON, CRUM ([id number]) 02/26/17; the patient has been to see his primary doctor Dr. Jerrel Ivory at [place] our primary care. She started  him on Lasix 20 mg and this seems to have helped with the edema. However we are not making substantial change with the left lateral calf wound and inflammation. The biopsy site on the right leg also looks stable but not really all that different. 03/12/17; the patient has been to see vein and vascular Dr. Lucky Cowboy. He has had venous reflux studies I have not reviewed these. I did get a call from his dermatology office. 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 They therefore recommended clobetasol and tetracycline i.e. 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 03/26/17; apparently the patient just had reflux studies not an appointment with Dr. dew. She arrives in clinic today having applied clobetasol for 2-3 weeks. He notes over the last 2-3 days excessive drainage having to change the dressing 3-4 times a day and also expanding erythema. He states the expanding erythema seems to come and go and was last this red was earlier in the month.he is on doxycycline 150 mg twice a day as an  anti-inflammatory systemic therapy for possible pyoderma gangrenosum along with the topical clobetasol 04/02/17; the patient was seen last week by Dr. Lillia Carmel at [facility] dermatology locally who kindly saw him at my request. 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 My concern was the degree of erythema medially extending from his left leg wound which was either inflammation from pyoderma or cellulitis. I put him on Augmentin however culture of the wound showed Pseudomonas which is quinolone sensitive. I really don't believe he has cellulitis however in view of everything I will continue and give him a course of Cipro. He is also on doxycycline as an immune modulator for the pyoderma. In addition to his original wound on the left lateral leg with surrounding erythema he has a wound on the right posterior calf which was an original biopsy site done by dermatology. 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 Saw Dr. Lillia Carmel yesterday. He has been using topical antibiotics to both wound areas his original wound on the left and the biopsies/pathergy area on the right. There is definitely some improvement in the inflammation around the wound on the right although the patient states he has increasing sensitivity of the wounds. He is on prednisone 60 and doxycycline 1 as prescribed by Dr. Lillia Carmel. He is covering the topical antibiotic with gauze and putting this in his own compression stocks and changing this daily. He states that Dr. Lottie Rater did a culture of the left leg wound yesterday 05/07/17; pyoderma gangrenosum. The patient saw Dr. Lillia Carmel yesterday and has a follow-up with her in one month. He is still using topical antibiotics to both wounds although he can't recall exactly what type. He is still on prednisone 60 mg. Dr. Lillia Carmel stated that the doxycycline could stop if we  were in agreement. 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 The right medial leg was induced by biopsy/pathergy. The area on the right is essentially healed. Still on high-dose prednisone using topical antibiotics to the wound 07/09/17; pyoderma gangrenosum with wounds on the  left lateral leg. The right medial leg has closed and remains closed. He is still on prednisone 60. He tells me he missed his last dermatology appointment with Dr. Lillia Carmel but will make another appointment. He reports that her blood sugar at a recent screen in [place] was high 200's. He was 180 today. 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 I think he is going to require still much longer prednisone perhaps another 3 months before attempting to taper. In the meantime his wound is a lot better. Smaller. He is cleaning this off daily and applying topical antibiotics. When he was last in the clinic I thought about changing to [facility] and actually put in a couple of calls to dermatology although probably not during their business hours. 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 He continues on oral prednisone. He has been using triple antibiotic ointment. There is surface debris and we will transition to [facility] and have him return in 2 weeks. He has lost 30 pounds since his last appointment with lifestyle modification. He may benefit from topical steroid cream for treatment this can be considered at a later date. 08/22/17 on evaluation today patient appears to actually be doing rather well in regard to his left lateral lower extremity ulcer. He has actually been managed by Dr. Dellia Nims most recently. Patient is currently on oral steroids at this time. This seems to have been of benefit for  him. Nonetheless his last visit was actually with Leah on 08/06/17. Currently he is not utilizing any topical steroid creams although this could be of benefit as well. No fevers, chills, nausea, or vomiting noted at this time. 09/05/17 on evaluation today patient appears to be doing better in regard to his left lateral lower extremity ulcer. He has been tolerating the dressing changes without complication. He is using Santyl with good effect. Overall I'm very pleased with how things are standing at this point. Patient likewise is happy that this is doing better. 09/19/17 on evaluation today patient actually appears to be doing rather well in regard to his left lateral lower extremity ulcer. 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 He's not having any significant pain. 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 He has been tolerating the Santyl without complication and in general I feel like he's making good progress. 10/17/17 on evaluation today patient appears to be doing very well in regard to his left lateral lower surety ulcer. He has been tolerating the dressing changes without complication. There does not appear to be any evidence of infection he's alternating the Santyl and the triple antibiotic ointment every other day this seems to be doing well for him. 11/03/17 on evaluation today patient appears to be doing very well in regard to his left lateral lower extremity ulcer. He is been tolerating the dressing changes without complication which is good news. Fortunately there does not appear to be any evidence of infection which is also great news. Overall is doing excellent they are starting to taper down on the prednisone is down to 40 mg at this point it also started topical clobetasol for him. 11/17/17 on  evaluation today patient appears  to be doing well in regard to his left lateral lower surety ulcer. He's been tolerating the dressing changes without complication. He does note that he is having no pain, no excessive drainage or discharge, and overall he feels like things are going about how he would expect and hope they would. Overall he seems to have no evidence of infection at this time in my opinion which is good news. 12/04/17-He is seen in follow-up evaluation for right lateral lower extremity ulcer. He has been applying topical steroid cream. Today's measurement show slight increase in size. Over the next 2 weeks we will transition to every other day Santyl and steroid cream. He has been encouraged to monitor for changes and notify clinic with any concerns 12/15/17 on evaluation today patient's left lateral motion the ulcer and fortunately is doing worse again at this point. This just since last week to this week has close to doubled in size according to the patient. I did not seeing last week's I do not have a visual to compare this to in our system was also down so we do not have all the charts and at this point. Nonetheless it does have me somewhat concerned in regard to the fact that again he was worried enough about it he has contact the dermatology that placed them back on the full strength, 50 mg a day of the prednisone that he was taken previous. He continues to alternate using clobetasol along with Santyl at this point. He is obviously somewhat frustrated. AIDDEN, MARKOVIC ([id number]) 12/22/17 on evaluation today patient appears to be doing a little worse compared to last evaluation. Unfortunately the wound is a little deeper and slightly larger than the last week's evaluation. With that being said he has made some progress in regard to the irritation surrounding at this time unfortunately despite that progress that's been made he still has a significant issue going on here. I'm not certain  that he is having really any true infection at this time although with the Pyoderma gangrenosum it can sometimes be difficult to differentiate infection versus just inflammation. For that reason I discussed with him today the possibility of perform a wound culture to ensure there's nothing overtly infected. 01/06/18 on evaluation today patient's wound is larger and deeper than previously evaluated. With that being said it did appear that his wound was infected after my last evaluation with him. Subsequently I did end up prescribing a prescription for Bactrim DS which she has been taking and having no complication with. Fortunately there does not appear to be any evidence of infection at this point in time as far as anything spreading, no want to touch, and overall I feel like things are showing signs of improvement. 01/13/18 on evaluation today patient appears to be even a little larger and deeper than last time. There still muscle exposed in the base of the wound. Nonetheless he does appear to be less erythematous I do believe inflammation is calming down also believe the infection looks like it's probably resolved at this time based on what I'm seeing. No fevers, chills, nausea, or vomiting noted at this time. 01/30/18 on evaluation today patient actually appears to visually look better for the most part. Unfortunately those visually this looks better he does seem to potentially have what may be an abscess in the muscle that has been noted in the central portion of the wound. This is the first time that I have noted what appears to be fluctuance in the central  portion of the muscle. With that being said I'm somewhat more concerned about the fact that this might indicate an abscess formation at this location. I do believe that an ultrasound would be appropriate. This is likely something we need to try to do as soon as possible. He has been switch to mupirocin ointment and he is no longer using the  steroid ointment as prescribed by dermatology he sees them again next week he's been decreased from 60 to 40 mg of prednisone. 03/09/18 on evaluation today patient actually appears to be doing a little better compared to last time I saw him. There's not as much erythema surrounding the wound itself. He I did review his most recent infectious disease note which was dated 02/24/18. He saw Dr. Michel Bickers in [place]. 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 Patient is now off of antibiotics and they are continually observing this. There seems to be no change in the past two weeks in my pinion based on what the patient says and what I see today compared to what Dr. Megan Salon likely saw two weeks ago. No fevers, chills, nausea, or vomiting noted at this time. 03/23/18 on evaluation today patient's wound actually appears to be showing signs of improvement which is good news. He is currently still on the Dapsone. He is also working on tapering the prednisone to get off of this and Dr. Lottie Rater is working with him in this regard. Nonetheless overall I feel like the wound is doing well it does appear based on the infectious disease note that I reviewed from Dr. 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 I did also review the results of his ultrasound of left lower extremity which revealed there was a dentist tissue in the base of the wound without an abscess noted. 04/06/18 on evaluation today the patient's left lateral lower extremity ulcer actually appears to be doing fairly well which is excellent news. There does not appear to be any evidence of infection at this time which is also great news. Overall he still does have a significantly large ulceration although little by little he seems to be making  progress. He is down to 10 mg a day of the prednisone. 04/20/18 on evaluation today patient actually appears to be doing excellent at this time in regard to his left lower extremity ulcer. He's making signs of good progress unfortunately this is taking much longer than we would really like to see but nonetheless he is making progress. Fortunately there does not appear to be any evidence of infection at this time. No fevers, chills, nausea, or vomiting noted at this time. The patient has not been using the Santyl due to the cost he hadn't got in this field yet. He's mainly been using the antibiotic ointment topically. Subsequently he also tells me that he really has not been scrubbing in the shower I think this would be helpful again as I told him it doesn't have to be anything too aggressive to even make it believe just enough to keep it free of some of the loose slough and biofilm on the wound surface. 05/11/18 on evaluation today patient's wound appears to be making slow but sure progress in regard to the left lateral lower extremity ulcer. He is been tolerating  the dressing changes without complication. Fortunately there does not appear to be any evidence of infection at this time. He is still just using triple antibiotic ointment along with clobetasol occasionally over the area. He never got the Santyl and really does not seem to intend to in my pinion. 06/01/18 on evaluation today patient appears to be doing a little better in regard to his left lateral lower extremity ulcer. He states that overall he does not feel like he is doing as well with the Dapsone as he did with the prednisone. Nonetheless he sees his dermatologist later today and is gonna talk to them about the possibility of going back on the prednisone. Overall again I believe that the wound would be better if you would utilize Santyl but he really does not seem to be interested in going back to the Eads at this point. He has been  using triple antibiotic ointment. 06/15/18 on evaluation today patient's wound actually appears to be doing about the same at this point. Fortunately there is no signs of infection at this time. He has made slight improvements although he continues to not really want to clean the wound bed at this point. He states that he just doesn't mess with it he doesn't want to cause any problems with everything else he has going on. He has been on medication, antibiotics as prescribed by his dermatologist, for a staff infection of his lower extremities which is really drying out now and looking much better he tells me. Fortunately there is no sign of overall infection. 06/29/18 on evaluation today patient appears to be doing well in regard to his left lateral lower surety ulcer all things considering. Fortunately his staff infection seems to be greatly improved compared to previous. He has no signs of infection and this is drying up quite nicely. He is still the doxycycline for this is no longer on cental, Dapsone, or any of the other medications. His dermatologist has recommended possibility of an infusion but right now he does not want to proceed with that. 07/13/18 on evaluation today patient appears to be doing about the same in regard to his left lateral lower surety ulcer. Fortunately there's no signs of infection at this time which is great news. Unfortunately he still builds up a significant amount of Slough/biofilm of the surface of the wound he still is not really cleaning this as he should be appropriately. Again I'm able to easily with saline and gauze remove the majority of this on the surface which if you would do this at home would likely be a dramatic improvement for him as far as getting the area to improve. Nonetheless overall I still feel like he is making progress is just very slow. I think Santyl will be of benefit for him as well. Still he has not gotten this as of this point. 07/27/18 on  evaluation today patient actually appears to be doing little worse in regards of the erythema around the periwound region of the wound he also tells me that he's been having more drainage currently compared to what he was experiencing last time I saw him. He states not quite as bad as what he had because this was infected previously but nonetheless is still appears to be doing poorly. Fortunately there is no evidence of systemic infection at this point. The patient tells me that he is not going to be able to afford the Santyl. He is still waiting to hear about the infusion therapy with his  dermatologist. Apparently she wants an updated colonoscopy first. WATSON, ROBARGE ([id number]) 08/10/18 on evaluation today patient appears to be doing better in regard to his left lateral lower extremity ulcer. Fortunately he is showing signs of improvement in this regard he's actually been approved for Remicade infusion's as well although this has not been scheduled as of yet. Fortunately there's no signs of active infection at this time in regard to the wound although he is having some issues with infection of the right lower extremity is been seen as dermatologist for this. Fortunately they are definitely still working with him trying to keep things under control. 09/07/18 on evaluation today patient is actually doing rather well in regard to his left lateral lower extremity ulcer. He notes these actually having some hair grow back on his extremity which is something he has not seen in years. He also tells me that the pain is really not giving them any trouble at this time which is also good news overall she is very pleased with the progress he's using a combination of the mupirocin along with the probate is all mixed. 09/21/18 on evaluation today patient actually appears to be doing fairly well all things considered in regard to his looks from the ulcer. He's been tolerating the dressing changes without complication.  Fortunately there's no signs of active infection at this time which is good news he is still on all antibiotics or prevention of the staff infection. He has been on prednisone for time although he states it is gonna contact his dermatologist and see if she put them on a short course due to some irritation that he has going on currently. Fortunately there's no evidence of any overall worsening this is going very slow I think cental would be something that would be helpful for him although he states that $50 for tube is quite expensive. He therefore is not willing to get that at this point. 10/06/18 on evaluation today patient actually appears to be doing decently well in regard to his left lateral leg ulcer. He's been tolerating the dressing changes without complication. Fortunately there's no signs of active infection at this time. Overall I'm actually rather pleased with the progress he's making although it's slow he doesn't show any signs of infection and he does seem to be making some improvement. I do believe that he may need a switch up and dressings to try to help this to heal more appropriately and quickly. 10/19/18 on evaluation today patient actually appears to be doing better in regard to his left lateral lower extremity ulcer. This is shown signs of having much less Slough buildup at this point due to the fact he has been using the [employer]. Obviously this is very good news. The overall size of the wound is not dramatically smaller but again the appearance is. 11/02/18 on evaluation today patient actually appears to be doing quite well in regard to his lower [place] ulcer. A lot of the skin around the ulcer is actually somewhat irritating at this point this seems to be more due to the dressing causing irritation from the adhesive that anything else. Fortunately there is no signs of active infection at this time. 11/24/18 on evaluation today patient appears to be doing a little worse in regard to  his overall appearance of his lower extremity ulcer. There's more erythema and warmth around the wound unfortunately. He is currently on doxycycline which he has been on for some time. With that being said I'm not sure  that seems to be helping with what appears to possibly be an acute cellulitis with regard to his left lower extremity ulcer. No fevers, chills, nausea, or vomiting noted at this time. 12/08/18 on evaluation today patient's wounds actually appears to be doing significantly better compared to his last evaluation. He has been using Santyl along with alternating tripling about appointment as well as the steroid cream seems to be doing quite well and the wound is showing signs of improvement which is excellent news. Fortunately there's no evidence of infection and in fact his culture came back negative with only normal skin flora noted. 12/21/2018 upon evaluation today patient actually appears to be doing excellent with regard to his ulcer. This is actually the best that I have seen it since have been helping to take care of him. It is both smaller as well as less slough noted on the surface of the wound and seems to be showing signs of good improvement with new skin growing from the edges. He has been using just the triamcinolone he does wonder if he can get a refill of that ointment today. 01/04/2019 upon evaluation today patient actually appears to be doing well with regard to his left lateral lower extremity ulcer. With that being said it does not appear to be that he is doing quite as well as last time as far as progression is concerned. There does not appear to be any signs of infection or significant irritation which is good news. With that being said I do believe that he may benefit from switching to a collagen based dressing based on how clean The wound appears. 01/18/2019 on evaluation today patient actually appears to be doing well with regard to his wound on the left lower extremity.  He is not made a lot of progress compared to where we were previous but nonetheless does seem to be doing okay at this time which is good news. There is no signs of active infection which is also good news. My only concern currently is I do wish we can get him into utilizing the collagen dressing his insurance would not pay for the supplies that we ordered although it appears that he may be able to order this through his supply company that he typically utilizes. This is [employer]. Nonetheless he did try to order it during the office visit today and it appears this did go through. We will see if he can get that it is a different brand but nonetheless he has collagen and I do think will be beneficial. 02/01/2019 on evaluation today patient actually appears to be doing a little worse today in regard to the overall size of his wounds. Fortunately there is no signs of active infection at this time. That is visually. Nonetheless when this is happened before it was due to infection. For that reason were somewhat concerned about that this time as well. 02/08/2019 on evaluation today patient unfortunately appears to be doing slightly worse with regard to his wound upon evaluation today. Is measuring a little deeper and a little larger unfortunately. I am not really sure exactly what is causing this to enlarge he actually did see his dermatologist she is going to see about initiating Humira for him. Subsequently she also did do steroid injections into the wound itself in the periphery. Nonetheless still nonetheless he seems to be getting a little bit larger he is gone back to just using the steroid cream topically which I think is appropriate. I would say  hold off on the collagen for the time being is definitely a good thing to do. Based on the culture results which we finally did get the final result back regarding it shows staph as the bacteria noted again that can be a normal skin bacteria based on the  fact however he is having increased drainage and worsening of the wound measurement wise I would go ahead and place him on an antibiotic today I do believe for this. 02/15/2019 on evaluation today patient actually appears to be doing somewhat better in regard to his ulcer. There is no signs of worsening at this time I did review his culture results which showed evidence of Staphylococcus aureus but not MRSA. Again this could just be more related to the normal skin bacteria although he states the drainage has slowed down quite a bit he may have had a mild infection not just colonization. And was much smaller and then since around10/04/2019 on evaluation today patient appears to be doing unfortunately worse as far as the size of the wound. I really feel like that this is steadily getting larger again it had been doing excellent right at the beginning of September we have seen a steady increase in the area of the wound it is almost 2-1/2 times the size it was on September 1. Obviously this is a bad trend this is not wanting to see. For that reason we went back to using just the topical triamcinolone cream which does seem to help with inflammation. I checked him for bacteria by way of culture and nothing showed positive there. I am considering giving him a short course of a tapering steroid Eugune Sine, Marquie ([id number]) today to see if that is can be beneficial for him. The patient is in agreement with giving that a try. 03/08/2019 on evaluation today patient appears to be doing very well in comparison to last evaluation with regard to his lower extremity ulcer. This is showing signs of less inflammation and actually measuring slightly smaller compared to last time every other week over the past month and a half he has been measuring larger larger larger. Nonetheless I do believe that the issue has been inflammation the prednisone does seem to have been beneficial for him which is good news. No  fevers, chills, nausea, vomiting, or diarrhea. 03/22/2019 on evaluation today patient appears to be doing about the same with regard to his leg ulcer. He has been tolerating the dressing changes without complication. With that being said the wound seems to be mostly arrested at its current size but really is not making any progress except for when we prescribed the prednisone. He did show some signs of dropping as far as the overall size of the wound during that interval week. Nonetheless this is something he is not on long-term at this point and unfortunately I think he is getting need either this or else the Humira which his dermatologist has discussed try to get approval for. With that being said he will be seeing his dermatologist on the 11th of this month that is November. 04/19/2019 on evaluation today patient appears to be doing really about the same the wound is measuring slightly larger compared to last time I saw him. He has not been into the office since November 2 due to the fact that he unfortunately had Covid as that his entire family. He tells me that it was rough but they did pull-through and he seems to be doing much better. Fortunately there is  no signs of active infection at this time. No fevers, chills, nausea, vomiting, or diarrhea. 05/10/2019 on evaluation today patient unfortunately appears to be doing significantly worse as compared to last time I saw him. He does tell me that he has had his first dose of Humira and actually is scheduled to get the next one in the upcoming week. With that being said he tells me also that in the past several days he has been having a lot of issues with green drainage she showed me a picture this is more blue-green in color. He is also been having issues with increased sloughy buildup and the wound does appear to be larger today. Obviously this is not the direction that we want everything to take based on the starting of his Humira. Nonetheless I  think this is definitely a result of likely infection and to be honest I think this is probably Pseudomonas causing the infection based on what I am seeing. 05/24/2019 on evaluation today patient unfortunately appears to be doing significantly worse compared to his prior evaluation with me 2 weeks ago. I did review his culture results which showed that he does have Staph aureus as well as Pseudomonas noted on the culture. Nonetheless the Levaquin that I prescribed for him does not appear to have been appropriate and in fact he tells me he is no longer experiencing the green drainage and discharge that he had at the last visit. Fortunately there is no signs of active infection at this time which is good news although the wound has significantly worsened it in fact is much deeper than it was previous. We have been utilizing up to this point triamcinolone ointment as the prescription topical of choice but at this time I really feel like that the wound is getting need to be packed in order to appropriately manage this due to the deeper nature of the wound. Therefore something along the lines of an alginate dressing may be more appropriate. 05/31/2019 upon inspection today patient's wound actually showed signs of doing poorly at this point. Unfortunately he just does not seem to be making any good progress despite what we have tried. He actually did go ahead and pick up the Cipro and start taking that as he was noticing more green drainage he had previously completed the Levaquin that I prescribed for him as well. Nonetheless he missed his appointment for the seventh last week on Wednesday with the wound care center and [facility] where his dermatologist referred him. Obviously I do think a second opinion would be helpful at this point especially in light of the fact that the patient seems to be doing so poorly despite the fact that we have tried everything that I really know how at this point. The only  thing that ever seems to have helped him in the past is when he was on high doses of continual steroids that did seem to make a difference for him. 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 06/07/2019 upon evaluation today patient unfortunately appears to be doing worse yet again with regard to his wound. In fact I am starting to question whether or not he may have a fluid pocket in the muscle at this point based on the bulging and the soft appearance to the central portion  of the muscle area. There is not anything draining from the muscle itself at this time which is good news but nonetheless the wound is expanding. I am not really seeing any results of the Humira as far as overall wound progression based on what I am seeing at this point. The patient has been referred for second opinion with regard to his wound to the [facility] wound care center by his dermatologist which I definitely am not in opposition to. Unfortunately we tried multiple dressings in the past including collagen, alginate, and at one point even Hydrofera Blue. With that being said he is never really used it for any significant amount of time due to the fact that he often complains of pain associated with these dressings and then will go back to either using the Santyl which she has done intermittently or more frequently the triamcinolone. He is also using his own compression stockings. We have wrapped him in the past but again that was something else that he really was not a big fan of. 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 He seemed to do much better when he was on the steroids although honestly I understand that the steroids are not necessarily the best medication to be on long-term obviously 06/14/2019 on evaluation today patient appears to be doing actually a little bit better with regard to the overall appearance with his leg. Unfortunately he does continue to have issues with what appears to be some fluid underneath the muscle although he did see the wound specialty center at [facility] last week their main goals were to see about infusion therapy in place of the Humira as they feel like that is not quite strong enough. They also recommended that we continue with the treatment otherwise as we are they felt like that was appropriate and they are okay with him continuing to follow-up here with [place] in that regard. With that being said they are also sending him to the vein specialist there to see about vein stripping and if that would be of benefit for him. Subsequently they also did not really address whether or not an ultrasound of the muscle area to see if there is anything that needs to be addressed here would be appropriate or not. For that reason I discussed this with him last week I think we may proceed down that road at this point. 06/21/2019 upon evaluation today patient's wound actually appears to be doing slightly better compared to previous evaluations. I do believe that he has made a difference with regard to the progression here with the use of oral steroids. Again in the past has been the only thing that is really calm things down. He does tell me that from [facility] is gotten a good news from there that there are no further vein stripping that is necessary at this point. I do not have that available for review today although the patient did relay this to me. He also did obtain and have the ultrasound of the wound completed which I did  sign off on today. It does appear that there is no fluid collection under the muscle this  is likely then just edematous tissue in general. That is also good news. Overall I still believe the inflammation is the main issue here. He did inquire about the possibility of a wound VAC again with the muscle protruding like it is I am not really sure whether the wound VAC is necessarily ideal or not. That is something we will have to consider although I do believe he may need compression wrapping to try to help with edema control which could potentially be of benefit. 06/28/2019 on evaluation today patient appears to be doing slightly better measurement wise although this is not terribly smaller he least seems to be trending towards that direction. With that being said he still seems to have purulent drainage noted in the wound bed at this time. He has been on Levaquin followed by Cipro over the past month. Unfortunately he still seems to have some issues with active infection at this time. I did perform a culture last week in order to evaluate and see if indeed there was still anything going on. Subsequently the culture did come back Cacioppo, Jerelle ([id number]) showing Pseudomonas which is consistent with the drainage has been having which is blue-green in color. He also has had an odor that again was somewhat consistent with Pseudomonas as well. Long story short it appears that the culture showed an intermediate finding with regard to how well the Cipro will work for the Pseudomonas infection. Subsequently being that he does not seem to be clearing up and at best what we are doing is just keeping this at [place] I think he may need to see infectious disease to discuss IV antibiotic options. 07/05/2019 upon evaluation today patient appears to be doing okay in regard to his leg ulcer. He has been tolerating the dressing changes at this point without complication. Fortunately there is no signs of active infection at  this time which is good news. No fevers, chills, nausea, vomiting, or diarrhea. With that being said he does have an appointment with infectious disease tomorrow and his primary care on Wednesday. Again the reason for the infectious disease referral was due to the fact that he did not seem to be fully resolving with the use of oral antibiotics and therefore we were thinking that IV antibiotic therapy may be necessary secondary to the fact that there was an intermediate finding for how effective the Cipro may be. Nonetheless again he has been having a lot of purulent and even green drainage. Fortunately right now that seems to have calmed down over the past week with the reinitiation of the oral antibiotic. Nonetheless we will see what Dr. Megan Salon has to say. 07/12/2019 upon evaluation today patient appears to be doing about the same at this point in regard to his left lower extremity ulcer. Fortunately there is no signs of active infection at this time which is good news I do believe the Levaquin has been beneficial I did review Dr. Hale Bogus note and to be honest I agree that the patient's leg does appear to be doing better currently. What we found in the past as he does not seem to really completely resolve he will stop the antibiotic and then subsequently things will revert back to having issues with blue-green drainage, increased pain, and overall worsening in general. Obviously that is the reason I sent him back to infectious disease. 07/19/2019 upon evaluation today patient appears to be doing roughly the same in size there is really no dramatic improvement. He has started back on  the Levaquin at this point and though he seems to be doing okay he did still have a lot of blue/green drainage noted on evaluation today unfortunately. I think that this is still indicative more likely of a Pseudomonas infection as previously noted and again he does see Dr. Megan Salon in just a couple of days. I do not  know that were really able to effectively clear this with just oral antibiotics alone based on what I am seeing currently. Nonetheless we are still continue to try to manage as best we can with regard to the patient and his wound. I do think the wrap was helpful in decreasing the edema which is excellent news. No fevers, chills, nausea, vomiting, or diarrhea. 07/26/2019 upon evaluation today patient appears to be doing slightly better with regard to the overall appearance of the muscle there is no dark discoloration centrally. Fortunately there is no signs of active infection at this time. No fevers, chills, nausea, vomiting, or diarrhea. Patient's wound bed currently the patient did have an appointment with Dr. Megan Salon at infectious disease last week. With that being said Dr. Megan Salon the patient states was still somewhat hesitant about put him on any IV antibiotics he wanted [place] to repeat cultures today and then see where things go going forward. He does look like Dr. Megan Salon because of some improvement the patient did have with the Levaquin wanted [place] to see about repeating cultures. If it indeed grows the Pseudomonas again then he recommended a possibility of considering a PICC line placement and IV antibiotic therapy. He plans to see the patient back in 1 to 2 weeks. 08/02/2019 upon evaluation today patient appears to be doing poorly with regard to his left lower extremity. We did get the results of his culture back it shows that he is still showing evidence of Pseudomonas which is consistent with the purulent/blue-green drainage that he has currently. Subsequently the culture also shows that he now is showing resistance to the oral fluoroquinolones which is unfortunate as that was really the only thing to treat the infection prior. I do believe that he is looking like this is going require IV antibiotic therapy to get this under control. Fortunately there is no signs of systemic infection at this  time which is good news. The patient does see Dr. Megan Salon tomorrow. 08/09/2019 upon evaluation today patient appears to be doing better with regard to his left lower extremity ulcer in regard to the overall appearance. He is currently on IV antibiotic therapy. As ordered by Dr. Megan Salon. Currently the patient is on ceftazidime which she is going to take for the next 2 weeks and then follow-up for 4 to 5-week appointment with Dr. Megan Salon. The patient started this this past Friday symptoms have not for a total of 3 days currently in full. 08/16/2019 upon evaluation today patient's wound actually does show muscle in the base of the wound but in general does appear to be much better as far as the overall evidence of infection is concerned. In fact I feel like this is for the most part cleared up he still on the IV antibiotics he has not completed the full course yet but I think he is doing much better which is excellent news. 08/23/2019 upon evaluation today patient appears to be doing about the same with regard to his wound at this point. He tells me that he still has pain unfortunately. Fortunately there is no evidence of systemic infection at this time which is great news.  There is significant muscle protrusion. 09/13/19 upon evaluation today patient appears to be doing about the same in regard to his leg unfortunately. He still has a lot of drainage coming from the ulceration there is still muscle exposed. With that being said the patient's last wound culture still showed an intermediate finding with regard to the Pseudomonas he still having the bluish/green drainage as well. Overall I do not know that the wound has completely cleared of infection at this point. Fortunately there is no signs of active infection systemically at this point which is good news. 09/20/2019 upon evaluation today patient's wound actually appears to be doing about the same based on what I am seeing currently. I do not see any  signs of systemic infection he still does have evidence of some local infection and drainage. He did see Dr. Megan Salon last week and Dr. Megan Salon states that he probably does need a different IV antibiotic although he does not want to put him on this until the patient begins the Remicade infusion which is actually scheduled for about 10 days out from today on 13 May. Following that time Dr. Megan Salon is good to see him back and then will evaluate the feasibility of starting him on the IV antibiotic therapy once again at that point. I do not disagree with this plan I do believe as Dr. Megan Salon stated in his note that I reviewed today that the patient's issue is multifactorial with the pyoderma being 1 aspect of this that were hoping the Remicade will be helpful for her. In the meantime I think the gentamicin is, helping to keep things under decent okay control in regard to the ulcer. 09/27/2019 upon evaluation today patient appears to be doing about the same with regard to his wound still there is a lot of muscle exposure though he does have some hyper granulation tissue noted around the edge and actually some granulation tissue starting to form over the muscle which is actually good news. Fortunately there is no evidence of active infection which is also good news. His pain is less at this point. 5/21; this is a patient I have not seen in a long time. 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 He has a large wound on the left lateral leg with protruding muscle. He comes in the clinic today showing the same area on his left medial ankle. He says there is been a spot there for some time although we have not previously defined this. Today he has a clearly defined area with slight amount of skin breakdown surrounded by raised areas with a purplish hue in color. This is not painful he says it is irritated. This looks distinctly like I might imagine pyoderma  starting 10/25/2019 upon evaluation today patient's wound actually appears to be making some progress. He still has muscle protruding from the lateral portion of his left leg but fortunately the new area that they were concerned about at his last visit does not appear to have opened at this point. He is currently on Remicade infusions and seems to be doing better in my opinion in fact the wound itself seems to be overall much better. The purplish discoloration that he did have seems to have resolved and I think that is a good sign that hopefully the Remicade is doing its job. He does [name], Maks ([id number]) have some biofilm noted over the surface of the wound. 11/01/2019 on evaluation today patient's wound actually appears to be doing  excellent at this time. Fortunately there is no evidence of active infection and overall I feel like he is making great progress. The Remicade seems to be due excellent job in my opinion. 11/08/19 evaluation today vision actually appears to be doing quite well with regard to his weight ulcer. He's been tolerating dressing changes without complication. Fortunately there is no evidence of infection. No fevers, chills, nausea, or vomiting noted at this time. Overall states that is having more itching than pain which is actually a good sign in my opinion. 12/13/2019 upon evaluation today patient appears to be doing well today with regard to his wound. He has been tolerating the dressing changes without complication. Fortunately there is no sign of active infection at this time. No fevers, chills, nausea, vomiting, or diarrhea. Overall I feel like the infusion therapy has been very beneficial for him. 01/06/2020 on evaluation today patient appears to be doing well with regard to his wound. This is measuring smaller and actually looks to be doing better. Fortunately there is no signs of active infection at this point. No fevers, chills, nausea, vomiting, or diarrhea. With that  being said he does still have the blue-green drainage but this does not seem to be causing any significant issues currently. He has been using the gentamicin that does seem to be keeping things under decent control at this point. He goes later this morning for his next infusion therapy for the pyoderma which seems to also be very beneficial. 02/07/2020 on evaluation today patient appears to be doing about the same in regard to his wounds currently. Fortunately there is no signs of active infection systemically he does still have evidence of local infection still using gentamicin. He also is showing some signs of improvement albeit slowly I do feel like we are making some progress here. 02/21/2020 upon evaluation today patient appears to be making some signs of improvement the wound is measuring a little bit smaller which is great news and overall I am very pleased with where he stands currently. He is going to be having infusion therapy treatment on the 15th of this month. Fortunately there is no signs of active infection at this time. 03/13/2020 I do believe patient's wound is actually showing some signs of improvement here which is great news. He has continue with the infusion therapy through rheumatology/dermatology at [facility]. That does seem to be beneficial. I still think he gets as much benefit from this as he did from the prednisone initially but nonetheless obviously this is less harsh on his body that the prednisone as far as they are concerned. 03/31/2020 on evaluation today patient's wound actually showing signs of some pretty good improvement in regard to the overall appearance of the wound bed. There is still muscle exposed though he does have some epithelial growth around the edges of the wound. Fortunately there is no signs of active infection at this time. No fevers, chills, nausea, vomiting, or diarrhea. 04/24/2020 upon evaluation today patient appears to be doing about the same in regard  to his leg ulcer. He has been tolerating the dressing changes without complication. Fortunately there is no signs of active infection at this time. No fevers, chills, nausea, vomiting, or diarrhea. With that being said he still has a lot of irritation from the bandaging around the edges of the wound. We did discuss today the possibility of a referral to plastic surgery. 05/22/2020 on evaluation today patient appears to be doing well with regard to his wounds all  things considered. He has not been able to get the Chantix apparently there is a recall nurse that I was unaware of put out by [employer] involuntarily. Nonetheless for now I am and I have to do some research into what may be the best option for him to help with quitting in regard to smoking and we discussed that today. 06/26/2020 upon evaluation today patient appears to be doing well with regard to his wound from the standpoint of infection I do not see any signs of infection at this point. With that being said unfortunately he is still continuing to have issues with muscle exposure and again he is not having a whole lot of new skin growth unfortunately. There does not appear to be any signs of active infection at this time. No fevers, chills, nausea, vomiting, or diarrhea. 07/10/2020 upon evaluation today patient appears to be doing a little bit more poorly currently compared to where he was previous. I am concerned currently about an active infection that may be getting worse especially in light of the increased size and tenderness of the wound bed. No fevers, chills, nausea, vomiting, or diarrhea. 07/24/2020 upon evaluation today patient appears to be doing poorly in regard to his leg ulcer. He has been tolerating the dressing changes without complication but unfortunately is having a lot of discomfort. 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 Subsequently I think he is going  require possibly IV antibiotics to get this under control. I am very concerned about the severity of his infection and the amount of discomfort he is having. 07/31/2020 upon evaluation today patient appears to be doing about the same in regard to his leg wound. He did see Dr. Megan Salon and Dr. Megan Salon is actually going to start him on IV antibiotics. He goes for the PICC line tomorrow. With that being said there do not have that run for 2 weeks and then see how things are doing and depending on how he is progressing they may extend that a little longer. Nonetheless I am glad this is getting ready to be in place and definitely feel it may help the patient. In the meantime is been using mainly triamcinolone to the wound bed has an anti-inflammatory. 08/07/2020 on evaluation today patient appears to be doing well with regard to his wound compared even last week. In the interim he has gotten the PICC line placed and overall this seems to be doing excellent. There does not appear to be any evidence of infection which is great news systemically although locally of course has had the infection this appears to be improving with the use of the antibiotics. 08/14/2020 upon evaluation today patient's wound actually showing signs of excellent improvement. Overall the irritation has significantly improved the drainage is back down to more of a normal level and his pain is really pretty much nonexistent compared to what it was. Obviously I think that this is significantly improved secondary to the IV antibiotic therapy which has made all the difference in the world. Again he had a resistant form of Pseudomonas for which oral antibiotics just was not cutting it. Nonetheless I do think that still we need to consider the possibility of a surgical closure for this wound is been open so long and to be honest with muscle exposed I think this can be very hard to get this to close outside of this although definitely were  still working to try to do what we  can in that regard. 08/21/2020 upon evaluation today patient appears to be doing very well with regard to his wounds on the left lateral lower extremity/calf area. Fortunately there does not appear to be signs of active infection which is great news and overall very pleased with where things stand today. He is actually wrapping up his treatment with IV antibiotics tomorrow. After that we will see where things go from there. 08/28/2020 upon evaluation today patient appears to be doing decently well with regard to his leg ulcer. There does not appear to be any signs of Viner, Ashtian ([id number]) active infection which is great news and overall very pleased with where things stand today. No fevers, chills, nausea, vomiting, or diarrhea. 09/18/2020 upon evaluation today patient appears to be doing well with regard to his infection which I feel like is better. Unfortunately he is not doing as well with regard to the overall size of the wound which is not nearly as good at this point. I feel like that he may be having an issue here with the pyoderma being somewhat out of control. I think that he may benefit from potentially going back and talking to the dermatologist about what to do from the pyoderma standpoint. I am not certain if the infusions are helping nearly as much is what the prednisone did in the past. 10/02/2020 upon evaluation today patient appears to be doing well with regard to his leg ulcer. He did go to the [facility] nurse. Unfortunately they feel like there is a 10% chance that most that he would be able to heal and that the skin graft would take. Obviously this has led him to not be able to go down that path as far as treatment is concerned. Nonetheless he does seem to be doing a little bit better with the prednisone that I gave him last time. I think that he may need to discuss with dermatology the possibility of long-term prednisone as that seems to be what is  most helpful for him to be perfectly honest. I am not sure the Remicade is really doing the job. 10/17/2020 upon evaluation today patient appears to be doing a little better in regard to his wound. In fact the case has been since we did the prednisone on May 2 for him that we have noticed a little bit of improvement each time we have seen a size wise as well as appearance wise as well as pain wise. I think the prednisone has had a greater effect then the infusion therapy has to be perfectly honest. With that being said the patient also feels significantly better compared to what he was previous. All of this is good news but nonetheless I am still concerned about the fact that again we are really not set up to long-term manage him as far as prednisone is concerned. Obviously there are things that you need to be watched I completely understand the risk of prednisone usage as well. 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 With all that being said I do believe that we can give him another round of the prednisone which she is requesting today because of the improvement that he seen since we did that first round. 10/30/2020 upon evaluation today patient's wound actually is showing signs of doing quite well. There does not appear to be any evidence of infection which is great news and overall very pleased with where things stand today. No fevers,  chills, nausea, vomiting, or diarrhea. He tells me that the prednisone still has seem to have helped he wonders if we can extend that for just a little bit longer. He did not have the appointment with a dermatologist although he did have an infusion appointment last Friday. That was at [facility]. With that being said he tells me he could not do both that as well as the appointment with the physician on the same day therefore that is can have to be rescheduled. I really want to see if there is anything they feel like that could  be done differently to try to help this out as I am not really certain that the infusions are helping significantly here. 11/13/2020 upon evaluation today patient unfortunately appears to be doing somewhat poorly in regard to his wound I feel like this is actually worsening from the standpoint of the pyoderma spreading. I still feel like that he may need something different as far as trying to manage this going forward. Again we did the prednisone unfortunately his blood sugars are not doing so well because of this. Nonetheless I believe that the patient likely needs to try topical steroid. We have done triamcinolone for a while I think going with something stronger such as clobetasol could be beneficial again this is not something I do lightly I discussed this with the patient that again this does not normally put underneath an occlusive dressing. Nonetheless I think a thin film as such could help with some of the stronger anti-inflammatory effects. We discussed this today. He would like to try to give this a trial for the next couple weeks. I definitely think that is something that we can do. Evaluate7/03/2021 and today patient's wound bed actually showed signs of doing really about the same. There was a little expansion of the size of the wound and that leading edge that we done looking out although the clobetasol does seem to have slowed this down a bit in my opinion. There is just 1 small area that still seems to be progressing based on what I see. Nonetheless I am concerned about the fact this does not seem to be improving if anything seems to be doing a little bit worse. I do not know that the infusions are really helping him much as next infusion is August 5 his appointment with dermatology is July 25. Either way I really think that we need to have a conversation potentially about this and I am actually going to see if I can talk with Dr. Lillia Carmel in order to see where things stand as  well. 12/11/2020 upon evaluation today patient appears to be doing worse in regard to his leg ulcer. Unfortunately I just do not think this is making the progress that I would like to see at this point. Honestly he does have an appointment with dermatology and this is in 2 days. I am wondering what they may have to offer to help with this. Right now what I am seeing is that he is continuing to show signs of worsening little by little. Obviously that is not great at all. Is the exact opposite of what we are looking for. 12/18/2020 upon evaluation today patient appears to be doing a little better in regard to his wound. The dermatologist actually did do some steroid injections into the wound which does seem to have been beneficial in my opinion. That was on the 25th already this looks a little better to me than last time I  saw him. With that being said we did do a culture and this did show that he has Staph aureus noted in abundance in the wound. With that being said I do think that getting him on an oral antibiotic would be appropriate as well. Also think we can compression wrap and this will make a difference as well. 12/28/2020 upon evaluation today patient's wound is actually showing signs of doing much better. I do believe the compression wrap is helping he has a lot of drainage but to be honest I think that the compression is helping to some degree in this regard as well as not draining through which is also good news. No fevers, chills, nausea, vomiting, or diarrhea. 01/04/2021 upon evaluation today patient appears to be doing well with regard to his wound. Overall things seem to be doing quite well. He did have a little bit of reaction to the CarboFlex Sorbact he will be using that any longer. With that being said he is controlled as far as the drainage is concerned overall and seems to be doing quite well. I do not see any signs of active infection at this time which is great news. No fevers,  chills, nausea, vomiting, or diarrhea. 01/11/2021 upon evaluation today patient appears to be doing well with regard to his wounds. He has been tolerating the dressing changes without complication. Fortunately there does not appear to be any signs of active infection at this time which is great news. Overall I am extremely pleased with where we stand currently. No fevers, chills, nausea, vomiting, or diarrhea. Where using clobetasol in the wound bed he has a lot of new skin growth which is awesome as well. 01/18/2021 upon evaluation today patient appears to be doing very well in regard to his leg ulcer. He has been tolerating the dressing changes without complication. Fortunately there does not appear to be any signs of active infection which is great news. In general I think that he is making excellent progress 01/25/2021 upon evaluation today patient appears to be doing well with regard to his wound on the leg. I am actually extremely pleased with where things stand today. There does not appear to be any signs of active infection which is great news and overall I think that we are definitely headed in the appropriate direction based on what I am seeing currently. There does not appear to be any signs of active infection also excellent news. 02/06/2021 upon evaluation today patient appears to be doing well with regard to his wound. Overall visually this is showing signs of significant [place]ston, Freemon ([id number]) improvement which is great news. I do not see any signs of active infection systemically which is great even locally I do not think that we are seeing any major complications here. We did do fluorescence imaging with the [employer] today. The patient does have some odor and drainage noted and again this is something that I think would benefit him to probably come more frequently for nurse visits. 02/19/2021 upon evaluation today patient actually appears to be doing quite well in regard to his  wound. He has been tolerating the dressing changes without complication and overall I think that this is making excellent progress. I do not see any evidence of active infection at this point which is great news as well. No fevers, chills, nausea, vomiting, or diarrhea. 10/10; wound is made nice progress healthy granulation with a nice rim of epithelialization which seems to be expanding even from last week  he has a deeper area in the inferior part of the more distal part of the wound with not quite as healthy as surface. This area will need to be followed. Using clobetasol and Hydrofera Blue 03/05/2021 upon evaluation today patient appears to be doing very well in regard to his leg ulcer. He has been tolerating dressing changes without complication. Fortunately there does not appear to be any signs of active infection which is great news and overall I am extremely pleased with where we stand currently. 03/12/2021 upon evaluation today patient appears to be doing well with regard to his wound in fact this is extremely extremely good based on what we are seeing today there does not appear to be any signs of active infection and overall I think that he is doing awesome from the standpoint of healing in general. I am extremely pleased with how things seem to be progressing with regard to this pyoderma. Clobetasol has done wonders for him. I think the compression wrapping has also been of great benefit. 03/19/2021 upon evaluation today patient appears to be doing well with regard to his wound. He is tolerating the dressing changes without complication. In fact I feel like that he is actually making excellent progress at this point based on what I am seeing. No fevers, chills, nausea, vomiting, or diarrhea. Objective Constitutional Well-nourished and well-hydrated in no acute distress. Vitals Time Taken: 8:21 AM, Height: 71 in, Weight: 338 lbs, BMI: 47.1, Temperature: 98.5 F, Pulse: 85 bpm,  Respiratory Rate: 18 breaths/min, Blood Pressure: 132/77 mmHg. Respiratory normal breathing without difficulty. Psychiatric this patient is able to make decisions and demonstrates good insight into disease process. Alert and Oriented x 3. pleasant and cooperative. General Notes: Upon inspection patient's wound bed actually showed signs of good granulation and epithelization at this point. Fortunately there does not appear to be any signs of active infection systemically either which is also good news. No fevers, chills, nausea, vomiting, or diarrhea. Integumentary (Hair, Skin) Wound #1 status is Open. Original cause of wound was Gradually Appeared. The date acquired was: 11/18/2015. The wound has been in treatment 223 weeks. The wound is located on the Left,Lateral Lower Leg. The wound measures 5.5cm length x 2.5cm width x 0.4cm depth; 10.799cm^2 area and 4.32cm^3 volume. There is tendon and Fat Layer (Subcutaneous Tissue) exposed. There is no tunneling or undermining noted. There is a medium amount of serosanguineous drainage noted. There is large (67-100%) red, hyper - granulation within the wound bed. There is no necrotic tissue within the wound bed. 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 There was a Four Layer Compression Therapy Procedure by Carlene Coria, RN. Post procedure Diagnosis Wound #1: Same as Pre-Procedure Plan Follow-up Appointments: Return Appointment in 1 week. Nurse Visit as needed - twice a week Bathing/ Shower/ Hygiene: Clean wound with  Normal Saline or wound cleanser. Other: - Apply dakins soaked gauze on wound after cleansing wound IN OFFICE ONLY Edema Control - Lymphedema / Segmental Compressive Device / Other: Optional: One layer of unna paste to top of compression wrap (to act as an anchor). Elevate, Exercise Daily and Avoid Standing for Long Periods of Time. Elevate  legs to the level of the heart and pump ankles as often as possible Elevate leg(s) parallel to the floor when sitting. WOUND #1: - Lower Leg Wound Laterality: Left, Lateral Cleanser: Wound Cleanser 3 x Per Week/30 Days Discharge Instructions: Wash your hands with soap and water. Remove old dressing, discard into plastic bag and place into trash. Cleanse the wound with Wound Cleanser prior to applying a clean dressing using gauze sponges, not tissues or cotton balls. Do not scrub or use excessive force. Pat dry using gauze sponges, not tissue or cotton balls. Peri-Wound Care: Moisturizing Lotion 3 x Per Week/30 Days Discharge Instructions: Suggestions: Theraderm, Eucerin, Cetaphil, or patient preference. Topical: Clobetasol Propionate ointment 0.05%, 60 (g) tube 3 x Per Week/30 Days Discharge Instructions: Apply to 7 o'clock depth Primary Dressing: Hydrofera Blue Ready Transfer Foam, 4x5 (in/in) 3 x Per Week/30 Days Discharge Instructions: Apply Hydrofera Blue Ready to wound bed as directed Secondary Dressing: Xtrasorb Medium 4x5 (in/in) 3 x Per Week/30 Days Discharge Instructions: Apply to wound as directed. Do not cut. Compression Wrap: Medichoice 4 layer Compression System, 35-40 mmHG (Generic) 3 x Per Week/30 Days Discharge Instructions: Apply multi-layer wrap as directed. 1. I would recommend currently that we going continue with wound care measures as before the patient is in agreement with the plan this includes the use of the clobetasol ointment and a thin but topically applied distribution over the surface of the wound. 2. Also can recommend we  continue with the [facility] currently which seems to be doing well. 3. We will cover with XtraSorb that a 4-layer compression wrap. We will see patient back for reevaluation in 1 week here in the clinic. If anything worsens or changes patient will contact our office for additional recommendations. Electronic Signature(s) Signed: 03/19/2021 9:15:21 AM By: Worthy Keeler PA-C Entered By: Worthy Keeler on 03/19/2021 09:15:21 MARTEZE, VECCHIO ([id number]) -------------------------------------------------------------------------------- SuperBill Details Patient Name: [name] Date of Service: 03/19/2021 Medical Record Number: [id number] Patient Account Number: [id number] Date of Birth/Sex: 1981-05-14 (40 y.o. M) Treating RN: Carlene Coria Primary Care Provider: [name] Other Clinician: Referring Provider: [name] Treating Provider/Extender: [name] Cliche in Treatment: 223 Diagnosis Coding ICD-10 Codes Code Description [phone] Non-pressure chronic ulcer of left calf with fat layer exposed L88 Pyoderma gangrenosum L97.321 Non-pressure chronic ulcer of left ankle limited to breakdown of skin I87.2 Venous insufficiency (chronic) (peripheral) L03.116 Cellulitis of left lower limb F17.208 Nicotine dependence, unspecified, with other nicotine-induced disorders Facility Procedures CPT4 Code: [id number] Description: (Facility Use Only) [phone] - APPLY MULTLAY COMPRS LWR LT LEG Modifier: Quantity: 1 Physician Procedures CPT4 Code: 8768115 Description: 72620 - WC PHYS LEVEL 4 - EST PT Modifier: Quantity: 1 CPT4 Code: Description: ICD-10 Diagnosis Description L97.222 Non-pressure chronic ulcer of left calf with fat layer exposed L88 Pyoderma gangrenosum L97.321 Non-pressure chronic ulcer of left ankle limited to breakdown of skin I87.2 Venous insufficiency (chronic)  (peripheral) Modifier: Quantity: Electronic Signature(s) Signed: 03/19/2021 9:15:40 AM By: Worthy Keeler PA-C Entered By: Worthy Keeler on 03/19/2021 09:15:40

## 2021-03-20 ENCOUNTER — Encounter: Payer: 59 | Admitting: Physician Assistant

## 2021-03-20 NOTE — Progress Notes (Signed)
DAEMIAN, GAHM ([id number]) Visit Report for 03/19/2021 Arrival Information Details Patient Name: [name], [name] Date of Service: 03/19/2021 8:15 AM Medical Record Number: [id number] Patient Account Number: [id number] Date of Birth/Sex: 01/22/1981 (40 y.o. M) Treating RN: Carlene Coria Primary Care Lucely Leard: [name] Other Clinician: Referring Evarose Altland: [name] Treating Kameshia Madruga/Extender: [name] Cliche in Treatment: 1 Visit Information History Since Last Visit All ordered tests and consults were completed: No Patient Arrived: Ambulatory Added or deleted any medications: No Arrival Time: 08:11 Any new allergies or adverse reactions: No Accompanied By: self Had a fall or experienced change in No Transfer Assistance: None activities of daily living that may affect Patient Identification Verified: Yes risk of falls: Secondary Verification Process Completed: Yes Signs or symptoms of abuse/neglect since last visito No Patient Requires Transmission-Based No Hospitalized since last visit: No Precautions: Implantable device outside of the clinic excluding No Patient Has Alerts: Yes cellular tissue based products placed in the center Patient Alerts: Patient has reaction since last visit: to Has Dressing in Place as Prescribed: Yes silver dressings. Has Compression in Place as Prescribed: Yes Pain Present Now: No Electronic Signature(s) Signed: 03/19/2021 6:56:11 PM By: Carlene Coria RN Entered By: Carlene Coria on 03/19/2021 08:21:26 [name] ([id number]) -------------------------------------------------------------------------------- Clinic Level of Care Assessment Details Patient Name: [name] Date of Service: 03/19/2021 8:15 AM Medical Record Number: [id number] Patient Account Number: [id number] Date of Birth/Sex: April 26, 1981 (40 y.o. M) Treating RN: Carlene Coria Primary Care Justina Bertini: [name] Other Clinician: Referring Johan Creveling: [name] Treating Archer Moist/Extender: [name] Cliche in Treatment: [facility] Level of Care Assessment Items TOOL 1 Quantity Score []  - Use when EandM and Procedure is performed on INITIAL visit 0 ASSESSMENTS - Nursing Assessment / Reassessment []  - General Physical Exam (combine w/ comprehensive assessment (listed just below) when performed on new 0 pt. evals) []  - 0 Comprehensive Assessment (HX, ROS, Risk Assessments, Wounds Hx, etc.) ASSESSMENTS - Wound and Skin Assessment / Reassessment []  - Dermatologic / Skin Assessment (not related to wound area) 0 ASSESSMENTS - Ostomy and/or Continence Assessment and Care []  - Incontinence Assessment and Management 0 []  - 0 Ostomy Care Assessment and Management (repouching, etc.) PROCESS - Coordination of Care []  - Simple Patient / Family Education for ongoing care 0 []  - 0 Complex (extensive) Patient / Family Education for ongoing care []  - 0 Staff obtains Programmer, systems, Records, Test Results / Process Orders []  - 0 Staff telephones HHA, Nursing Homes / Clarify orders / etc []  - 0 Routine Transfer to another Facility (non-emergent condition) []  - 0 Routine Hospital Admission (non-emergent condition) []  - 0 New Admissions / Biomedical engineer / Ordering NPWT, Apligraf, etc. []  - 0 Emergency Hospital Admission (emergent condition) PROCESS - Special Needs []  - Pediatric / Minor Patient Management 0 []  - 0 Isolation Patient Management []  - 0 Hearing / Language / Visual special needs []  - 0 Assessment of Community assistance (transportation, D/C planning, etc.) []  - 0 Additional assistance / Altered mentation []  - 0 Support Surface(s) Assessment (bed, cushion, seat, etc.) INTERVENTIONS - Miscellaneous []  - External ear exam 0 []  - 0 Patient Transfer (multiple staff / Civil Service fast streamer / Similar devices) []  - 0 Simple Staple / Suture removal (25 or less) []  - 0 Complex Staple / Suture removal (26 or more) []  -  0 Hypo/Hyperglycemic Management (do not check if billed separately) []  - 0 Ankle / Brachial Index (ABI) - do not check if billed separately Has the patient been seen at the  hospital within the last three years: Yes Total Score: 0 Level Of Care: ____ [name] ([id number]) Electronic Signature(s) Signed: 03/19/2021 6:56:11 PM By: Carlene Coria RN Entered By: Carlene Coria on 03/19/2021 08:31:17 [name] ([id number]) -------------------------------------------------------------------------------- Compression Therapy Details Patient Name: [name] Date of Service: 03/19/2021 8:15 AM Medical Record Number: [id number] Patient Account Number: [id number] Date of Birth/Sex: May 01, 1981 (40 y.o. M) Treating RN: Carlene Coria Primary Care Josias Tomerlin: [name] Other Clinician: Referring Francyne Arreaga: [name] Treating Shadasia Oldfield/Extender: [name] Cliche in Treatment: 223 Compression Therapy Performed for Wound Assessment: Wound #1 Left,Lateral Lower Leg Performed By: Clinician Carlene Coria, RN Compression Type: Four Layer Post Procedure Diagnosis Same as Pre-procedure Electronic Signature(s) Signed: 03/19/2021 6:56:11 PM By: Carlene Coria RN Entered By: Carlene Coria on 03/19/2021 08:30:40 [name] ([id number]) -------------------------------------------------------------------------------- Encounter Discharge Information Details Patient Name: [name] Date of Service: 03/19/2021 8:15 AM Medical Record Number: [id number] Patient Account Number: [id number] Date of Birth/Sex: [birth year] (40 y.o. M) Treating RN: Carlene Coria Primary Care Sanchez Hemmer: [name] Other Clinician: Referring Zuzanna Maroney: [name] Treating Pearson Reasons/Extender: [name] Cliche in Treatment: 223 Encounter Discharge Information Items Discharge Condition: Stable Ambulatory Status: Ambulatory Discharge Destination: Home Transportation: Private Auto Accompanied By: self Schedule  Follow-up Appointment: Yes Clinical Summary of Care: Patient Declined Electronic Signature(s) Signed: 03/19/2021 6:56:11 PM By: Carlene Coria RN Entered By: Carlene Coria on 03/19/2021 08:32:55 [name] ([id number]) -------------------------------------------------------------------------------- Lower Extremity Assessment Details Patient Name: [name] Date of Service: 03/19/2021 8:15 AM Medical Record Number: [id number] Patient Account Number: [id number] Date of Birth/Sex: 03/03/1981 (40 y.o. M) Treating RN: Carlene Coria Primary Care Lunetta Marina: [name] Other Clinician: Referring Deforrest Bogle: [name] Treating Bryer Gottsch/Extender: [name] Cliche in Treatment: 223 Edema Assessment Assessed: [Left: No] [Right: No] Edema: [Left: Ye] [Right: s] Calf Left: Right: Point of Measurement: 33 cm From Medial Instep 46 cm Ankle Left: Right: Point of Measurement: 12 cm From Medial Instep 29 cm Vascular Assessment Pulses: Dorsalis Pedis Palpable: [Left:Yes] Electronic Signature(s) Signed: 03/19/2021 6:56:11 PM By: Carlene Coria RN Entered By: Carlene Coria on 03/19/2021 08:24:21 [name] ([id number]) -------------------------------------------------------------------------------- Multi Wound Chart Details Patient Name: [name] Date of Service: 03/19/2021 8:15 AM Medical Record Number: [id number] Patient Account Number: [id number] Date of Birth/Sex: 1980/07/05 (40 y.o. M) Treating RN: Carlene Coria Primary Care Caraline Deutschman: [name] Other Clinician: Referring Shoni Quijas: [name] Treating Adley Castello/Extender: [name] Cliche in Treatment: 223 Vital Signs Height(in): 71 Pulse(bpm): 85 Weight(lbs): 338 Blood Pressure(mmHg): 132/77 Body Mass Index(BMI): 47 Temperature(F): 98.5 Respiratory Rate(breaths/min): 18 Photos: [N/A:N/A] Wound Location: Left, Lateral Lower Leg N/A N/A Wounding Event: Gradually Appeared N/A N/A Primary Etiology: Pyoderma  N/A N/A Comorbid History: Sleep Apnea, Hypertension, Colitis N/A N/A Date Acquired: 11/18/2015 N/A N/A Weeks of Treatment: 223 N/A N/A Wound Status: Open N/A N/A Measurements L x W x D (cm) 5.5x2.5x0.4 N/A N/A Area (cm) : 10.799 N/A N/A Volume (cm) : 4.32 N/A N/A % Reduction in Area: -120.00% N/A N/A % Reduction in Volume: -10.00% N/A N/A Classification: Full Thickness With Exposed N/A N/A Support Structures Exudate Amount: Medium N/A N/A Exudate Type: Serosanguineous N/A N/A Exudate Color: red, brown N/A N/A Granulation Amount: Large (67-100%) N/A N/A Granulation Quality: Red, Hyper-granulation N/A N/A Necrotic Amount: None Present (0%) N/A N/A Exposed Structures: Fat Layer (Subcutaneous Tissue): N/A N/A Yes Tendon: Yes Fascia: No Muscle: No Joint: No Bone: No Epithelialization: Medium (34-66%) N/A N/A Treatment Notes Electronic Signature(s) Signed: 03/19/2021 6:56:11 PM By: Carlene Coria RN Entered By: Carlene Coria on 03/19/2021 08:27:32 Sky,  JOANNA ([id number]) -------------------------------------------------------------------------------- Multi-Disciplinary Care Plan Details Patient Name: TYWON, NIDAY Date of Service: 03/19/2021 8:15 AM Medical Record Number: [id number] Patient Account Number: [id number] Date of Birth/Sex: [birth year] (40 y.o. M) Treating RN: Carlene Coria Primary Care Jarreau Callanan: [name] Other Clinician: Referring Monseratt Ledin: [name] Treating Stoy Fenn/Extender: [name] Cliche in Treatment: Ayden reviewed with physician Active Inactive Electronic Signature(s) Signed: 03/19/2021 6:56:11 PM By: Carlene Coria RN Entered By: Carlene Coria on 03/19/2021 08:27:13 THADDEAUS, MONICA ([id number]) -------------------------------------------------------------------------------- Pain Assessment Details Patient Name: [name] Date of Service: 03/19/2021 8:15 AM Medical Record Number: [id number] Patient Account  Number: [id number] Date of Birth/Sex: 12-27-1980 (40 y.o. M) Treating RN: Carlene Coria Primary Care Satchel Heidinger: [name] Other Clinician: Referring Niccole Witthuhn: [name] Treating Donald Memoli/Extender: [name] Cliche in Treatment: 223 Active Problems Location of Pain Severity and Description of Pain Patient Has Paino No Site Locations Pain Management and Medication Current Pain Management: Electronic Signature(s) Signed: 03/19/2021 6:56:11 PM By: Carlene Coria RN Entered By: Carlene Coria on 03/19/2021 08:22:13 [name] ([id number]) -------------------------------------------------------------------------------- Patient/Caregiver Education Details Patient Name: [name] Date of Service: 03/19/2021 8:15 AM Medical Record Number: [id number] Patient Account Number: [id number] Date of Birth/Gender: 11/12/1980 (40 y.o. M) Treating RN: Carlene Coria Primary Care Physician: [name] Other Clinician: Referring Physician: [name] Treating Physician/Extender: [name] Cliche in Treatment: 223 Education Assessment Education Provided To: Patient Education Topics Provided Wound/Skin Impairment: Methods: Explain/Verbal Electronic Signature(s) Signed: 03/19/2021 6:56:11 PM By: Carlene Coria RN Entered By: Carlene Coria on 03/19/2021 08:32:05 [name] ([id number]) -------------------------------------------------------------------------------- Wound Assessment Details Patient Name: [name] Date of Service: 03/19/2021 8:15 AM Medical Record Number: [id number] Patient Account Number: [id number] Date of Birth/Sex: 03-08-1981 (40 y.o. M) Treating RN: Carlene Coria Primary Care Cayne Yom: [name] Other Clinician: Referring Solomia Harrell: [name] Treating Jinger Middlesworth/Extender: [name] Cliche in Treatment: 223 Wound Status Wound Number: 1 Primary Etiology: Pyoderma Wound Location: Left, Lateral Lower Leg Wound Status: Open Wounding Event:  Gradually Appeared Comorbid History: Sleep Apnea, Hypertension, Colitis Date Acquired: 11/18/2015 Weeks Of Treatment: 223 Clustered Wound: No Photos Wound Measurements Length: (cm) 5.5 Width: (cm) 2.5 Depth: (cm) 0.4 Area: (cm) 10.799 Volume: (cm) 4.32 % Reduction in Area: -120% % Reduction in Volume: -10% Epithelialization: Medium (34-66%) Tunneling: No Undermining: No Wound Description Classification: Full Thickness With Exposed Support Structures Exudate Amount: Medium Exudate Type: Serosanguineous Exudate Color: red, brown Foul Odor After Cleansing: No Slough/Fibrino No Wound Bed Granulation Amount: Large (67-100%) Exposed Structure Granulation Quality: Red, Hyper-granulation Fascia Exposed: No Necrotic Amount: None Present (0%) Fat Layer (Subcutaneous Tissue) Exposed: Yes Tendon Exposed: Yes Muscle Exposed: No Joint Exposed: No Bone Exposed: No Treatment Notes Wound #1 (Lower Leg) Wound Laterality: Left, Lateral Cleanser Wound Cleanser Discharge Instruction: Wash your hands with soap and water. Remove old dressing, discard into plastic bag and place into trash. Cleanse the wound with Wound Cleanser prior to applying a clean dressing using gauze sponges, not tissues or cotton balls. Do not scrub or use excessive force. Pat dry using gauze sponges, not tissue or cotton balls. ATHEN, RIEL ([id number]) [place] Lotion Discharge Instruction: Suggestions: Theraderm, Eucerin, Cetaphil, or patient preference. Topical Clobetasol Propionate ointment 0.05%, 60 (g) tube Discharge Instruction: Apply to 7 o'clock depth Primary Dressing Hydrofera Blue Ready Transfer Foam, 4x5 (in/in) Discharge Instruction: Apply Hydrofera Blue Ready to wound bed as directed Secondary Dressing Xtrasorb Medium 4x5 (in/in) Discharge Instruction: Apply to wound as directed. Do not cut. Secured With Compression Wrap Medichoice 4 layer Compression System,  35-40  mmHG Discharge Instruction: Apply multi-layer wrap as directed. Compression Stockings Add-Ons Electronic Signature(s) Signed: 03/19/2021 6:56:11 PM By: Carlene Coria RN Entered By: Carlene Coria on 03/19/2021 08:23:29 MERRELL, BORSUK ([id number]) -------------------------------------------------------------------------------- Vitals Details Patient Name: [name] Date of Service: 03/19/2021 8:15 AM Medical Record Number: [id number] Patient Account Number: [id number] Date of Birth/Sex: 02/13/1981 (40 y.o. M) Treating RN: Carlene Coria Primary Care Jenaveve Fenstermaker: [name] Other Clinician: Referring Joal Eakle: [name] Treating Charissa Knowles/Extender: [name] Cliche in Treatment: 223 Vital Signs Time Taken: 08:21 Temperature (F): 98.5 Height (in): 71 Pulse (bpm): 85 Weight (lbs): 338 Respiratory Rate (breaths/min): 18 Body Mass Index (BMI): 47.1 Blood Pressure (mmHg): 132/77 Reference Range: 80 - 120 mg / dl Electronic Signature(s) Signed: 03/19/2021 6:56:11 PM By: Carlene Coria RN Entered By: Carlene Coria on 03/19/2021 08:21:56

## 2021-03-21 ENCOUNTER — Encounter: Payer: 59 | Attending: Internal Medicine

## 2021-03-21 ENCOUNTER — Other Ambulatory Visit: Payer: Self-pay

## 2021-03-21 ENCOUNTER — Ambulatory Visit
Admit: 2021-03-21 | Discharge: 2021-03-22 | Payer: PRIVATE HEALTH INSURANCE | Attending: Student in an Organized Health Care Education/Training Program | Primary: Student in an Organized Health Care Education/Training Program

## 2021-03-21 DIAGNOSIS — L720 Epidermal cyst: Principal | ICD-10-CM

## 2021-03-21 DIAGNOSIS — L88 Pyoderma gangrenosum: Principal | ICD-10-CM

## 2021-03-21 DIAGNOSIS — L219 Seborrheic dermatitis, unspecified: Principal | ICD-10-CM

## 2021-03-21 DIAGNOSIS — K51919 Ulcerative colitis, unspecified with unspecified complications: Principal | ICD-10-CM

## 2021-03-21 DIAGNOSIS — Z79899 Other long term (current) drug therapy: Principal | ICD-10-CM

## 2021-03-21 DIAGNOSIS — I872 Venous insufficiency (chronic) (peripheral): Secondary | ICD-10-CM | POA: Diagnosis not present

## 2021-03-21 DIAGNOSIS — I10 Essential (primary) hypertension: Secondary | ICD-10-CM | POA: Diagnosis not present

## 2021-03-21 DIAGNOSIS — E11622 Type 2 diabetes mellitus with other skin ulcer: Secondary | ICD-10-CM | POA: Diagnosis not present

## 2021-03-21 DIAGNOSIS — L97321 Non-pressure chronic ulcer of left ankle limited to breakdown of skin: Secondary | ICD-10-CM | POA: Insufficient documentation

## 2021-03-21 DIAGNOSIS — L080 Pyoderma: Secondary | ICD-10-CM | POA: Diagnosis present

## 2021-03-21 DIAGNOSIS — F17208 Nicotine dependence, unspecified, with other nicotine-induced disorders: Secondary | ICD-10-CM | POA: Insufficient documentation

## 2021-03-21 DIAGNOSIS — L97222 Non-pressure chronic ulcer of left calf with fat layer exposed: Secondary | ICD-10-CM | POA: Insufficient documentation

## 2021-03-21 DIAGNOSIS — L03116 Cellulitis of left lower limb: Secondary | ICD-10-CM | POA: Diagnosis not present

## 2021-03-21 MED ORDER — KETOCONAZOLE 2 % TOPICAL CREAM
Freq: Two times a day (BID) | TOPICAL | 5 refills | 30.00000 days | Status: CP
Start: 2021-03-21 — End: ?

## 2021-03-21 MED ORDER — HYDROCORTISONE 2.5 % TOPICAL OINTMENT
Freq: Two times a day (BID) | TOPICAL | 3 refills | 0.00000 days | Status: CP
Start: 2021-03-21 — End: 2022-03-21

## 2021-03-21 NOTE — Unmapped (Signed)
Dermatology Note     Assessment and Plan     Chronic ulcer of left lower extremity, multifactorial etiology in the setting of propensity for PG (in context of ulcerative colitis) and likely venous disease (with evidence of swelling/venous stasis) with history of recurrent infections. Improving on infliximab and with wound care  Chronic illness with exacerbation, active but slowly improving  - Continue infliximab 10 mg/kg every 8 weeks.  -Continue follow-up with wound care every 2 weeks  -Continue topical gentamicin and alone daily to wound with bandage changes  -consider methotrexate, cyclosporine, abx (clinda/rifampin), ivig, ertapenem  ??  High risk medication use: infliximab  - quant gold unremarkable 2/22  - hepatitis panel (HBV cAb / sAb and HCV) negative 07/2019 -- HBV non-immune  - reviewed cbc, lfts, mg, k, lipid panel from 12/13/2020    Seborrheic dermatitis  - Discussed diagnosis, typical course, and treatment options for this condition  - Explained to the patient the chronic nature of this diagnosis  - ketoconazole (NIZORAL) 2 % cream; Apply 1 application topically Two (2) times a day. To affected areas as directed  - hydrocortisone 2.5 % ointment; Apply 1 application topically Two (2) times a day. As needed for flaring of seborrheic dermatitis around nose and eyebrows    Subcutaneous cyst, favor epidermal inclusion cyst  - Explained to patient this most likely is consistent with an epidermal inclusion cyst, which represents trapped hair follicule and skin cells under the skin  - Benign, patient reassured  - Given that the lesion is symptomatic, patient would like to proceed with excision. Will message for surgical scheduling    The patient was advised to call for an appointment should any new, changing, or symptomatic lesions develop.     RTC: Return in about 4 months (around 07/19/2021) for PG. or sooner as needed     Chief Complaint     Chief Complaint   Patient presents with   ??? Lesion Of Concern Follow up ulcer on leg patient reports imprvement       HPI     Andrew Cameron is a 40 y.o. male who presents as a returning patient (last seen 12/13/2020) to [facility] for follow up of pyoderma gangrenosum. Patient reports significant improvement since last visit. Wound is about 1/4 the size is was frequently. Currently getting infliximab 10 mg/kg every 8 weeks. Also seeing wound care q2 weeks which he thinks has made the most help.     He has also noticed recently that he has had scaling and redness around his nose and eyebrows.    Reports a cyst on the upper back that he has had for years. Occasionally drains and/or becomes inflamed. Previously avoided excision due to exacerbations of PG and infection but wonders whether we can proceed with excision now.    The patient denies any other new or changing lesions or areas of concern.     Pertinent Past Medical History     Problem List        Digestive    Ulcerative colitis (CMS-HCC)     Last Assessment & Plan:   Currently seems to be in remission. He was prior on asacol for several years. Does not have GI doctor at this time.            Musculoskeletal and Integument    Pyoderma gangrenosum - Primary    Relevant Medications    hydrocortisone 2.5 % ointment       Other  High risk medication use        Past Medical History, Family History, Social History, Medication List, Allergies, and Problem List were reviewed in the rooming section of Epic.     ROS: Other than symptoms mentioned in the HPI, no fevers, chills, or other skin complaints.    Physical Examination     GENERAL: Well-appearing male in no acute distress, resting comfortably.  NEURO: Alert and oriented, answers questions appropriately  PSYCH: Normal mood and affect  RESP: No increased work of breathing  SKIN (Waist Up Skin Exam): Per patient request, exam of the scalp, face, eyelids, lips, nose, ears, neck, chest, upper abdomen, back, arms, hands, and fingernails was performed  - Cyst: A firm, skin-colored, subcutaneous nodule that is freely movable and has a central punctum located on the upper back   - Erythema and scaling of the central face, eyebrows  - Reviewed photographs of PG lesion (currently bandaged), significantly smaller with only subcutaneous tissue exposed    All areas not commented on are within normal limits or unremarkable

## 2021-03-21 NOTE — Unmapped (Signed)
Thank you for visiting [place] at [facility]! If you would like to get into contact with your dermatologist, please call our office at [phone] or, for non-urgent questions, send a MyChart message to the attending physician for the visit. Any message sent to the visit's attending physician on MyChart will be routed to the resident physician who participated in your care. Please allow 3 business days for response via MyChart.

## 2021-03-21 NOTE — Progress Notes (Signed)
KOBEN, DAMAN ([id number]) Visit Report for 03/21/2021 Arrival Information Details Patient Name: [name], [name] Date of Service: 03/21/2021 8:00 AM Medical Record Number: [id number] Patient Account Number: [id number] Date of Birth/Sex: 05/17/1981 (40 y.o. M) Treating RN: Donnamarie Poag Primary Care Vermon Grays: [name] Other Clinician: Referring Tico Crotteau: [name] Treating Dustine Bertini/Extender: Yaakov Guthrie in Treatment: 31 Visit Information History Since Last Visit Added or deleted any medications: No Patient Arrived: Ambulatory Had a fall or experienced change in No Arrival Time: 08:07 activities of daily living that may affect Accompanied By: self risk of falls: Transfer Assistance: None Hospitalized since last visit: No Patient Identification Verified: Yes Has Dressing in Place as Prescribed: Yes Secondary Verification Process Completed: Yes Has Compression in Place as Prescribed: Yes Patient Requires Transmission-Based No Pain Present Now: No Precautions: Patient Has Alerts: Yes Patient Alerts: Patient has reaction to silver dressings. Electronic Signature(s) Signed: 03/21/2021 12:41:53 PM By: Donnamarie Poag Entered By: Donnamarie Poag on 03/21/2021 08:24:04 [name] ([id number]) -------------------------------------------------------------------------------- Clinic Level of Care Assessment Details Patient Name: [name] Date of Service: 03/21/2021 8:00 AM Medical Record Number: [id number] Patient Account Number: [id number] Date of Birth/Sex: August 14, 1980 (40 y.o. M) Treating RN: Donnamarie Poag Primary Care Lean Fayson: [name] Other Clinician: Referring Aayra Hornbaker: [name] Treating Danessa Mensch/Extender: Yaakov Guthrie in Treatment: 224 Clinic Level of Care Assessment Items TOOL 1 Quantity Score []  - Use when EandM and Procedure is performed on INITIAL visit 0 ASSESSMENTS - Nursing Assessment / Reassessment []  - General Physical Exam  (combine w/ comprehensive assessment (listed just below) when performed on new 0 pt. evals) []  - 0 Comprehensive Assessment (HX, ROS, Risk Assessments, Wounds Hx, etc.) ASSESSMENTS - Wound and Skin Assessment / Reassessment []  - Dermatologic / Skin Assessment (not related to wound area) 0 ASSESSMENTS - Ostomy and/or Continence Assessment and Care []  - Incontinence Assessment and Management 0 []  - 0 Ostomy Care Assessment and Management (repouching, etc.) PROCESS - Coordination of Care []  - Simple Patient / Family Education for ongoing care 0 []  - 0 Complex (extensive) Patient / Family Education for ongoing care []  - 0 Staff obtains Programmer, systems, Records, Test Results / Process Orders []  - 0 Staff telephones HHA, Nursing Homes / Clarify orders / etc []  - 0 Routine Transfer to another Facility (non-emergent condition) []  - 0 Routine Hospital Admission (non-emergent condition) []  - 0 New Admissions / Biomedical engineer / Ordering NPWT, Apligraf, etc. []  - 0 Emergency Hospital Admission (emergent condition) PROCESS - Special Needs []  - Pediatric / Minor Patient Management 0 []  - 0 Isolation Patient Management []  - 0 Hearing / Language / Visual special needs []  - 0 Assessment of Community assistance (transportation, D/C planning, etc.) []  - 0 Additional assistance / Altered mentation []  - 0 Support Surface(s) Assessment (bed, cushion, seat, etc.) INTERVENTIONS - Miscellaneous []  - External ear exam 0 []  - 0 Patient Transfer (multiple staff / Civil Service fast streamer / Similar devices) []  - 0 Simple Staple / Suture removal (25 or less) []  - 0 Complex Staple / Suture removal (26 or more) []  - 0 Hypo/Hyperglycemic Management (do not check if billed separately) []  - 0 Ankle / Brachial Index (ABI) - do not check if billed separately Has the patient been seen at the hospital within the last three years: Yes Total Score: 0 Level Of Care: ____ [name] ([id number]) Electronic  Signature(s) Signed: 03/21/2021 12:41:53 PM By: Donnamarie Poag Entered By: Donnamarie Poag on 03/21/2021 49:70:26 [name] ([id number]) -------------------------------------------------------------------------------- Compression Therapy Details Patient Name: [name]  Date of Service: 03/21/2021 8:00 AM Medical Record Number: [id number] Patient Account Number: [id number] Date of Birth/Sex: 16-Mar-1981 (40 y.o. M) Treating RN: Donnamarie Poag Primary Care Reina Wilton: [name] Other Clinician: Referring Dorian Renfro: [name] Treating Shareen Capwell/Extender: Yaakov Guthrie in Treatment: 224 Compression Therapy Performed for Wound Assessment: Wound #1 Left,Lateral Lower Leg Performed By: [name] Argyle, RN Compression Type: Four Layer Electronic Signature(s) Signed: 03/21/2021 12:41:53 PM By: Donnamarie Poag Entered By: Donnamarie Poag on 03/21/2021 08:27:39 [name] ([id number]) -------------------------------------------------------------------------------- Encounter Discharge Information Details Patient Name: [name] Date of Service: 03/21/2021 8:00 AM Medical Record Number: [id number] Patient Account Number: [id number] Date of Birth/Sex: January 13, 1981 (40 y.o. M) Treating RN: Donnamarie Poag Primary Care Melesa Lecy: [name] Other Clinician: Referring Tarrin Menn: [name] Treating Bernedette Auston/Extender: Yaakov Guthrie in Treatment: 224 Encounter Discharge Information Items Discharge Condition: Stable Ambulatory Status: Ambulatory Discharge Destination: Home Transportation: Private Auto Accompanied By: self Schedule Follow-up Appointment: Yes Clinical Summary of Care: Electronic Signature(s) Signed: 03/21/2021 12:41:53 PM By: Donnamarie Poag Entered By: Donnamarie Poag on 03/21/2021 08:28:17 [name] ([id number]) -------------------------------------------------------------------------------- Wound Assessment Details Patient Name: [name] Date of  Service: 03/21/2021 8:00 AM Medical Record Number: [id number] Patient Account Number: [id number] Date of Birth/Sex: 10-27-1980 (40 y.o. M) Treating RN: Donnamarie Poag Primary Care Maria Coin: [name] Other Clinician: Referring Deionte Spivack: [name] Treating Huyen Perazzo/Extender: Yaakov Guthrie in Treatment: 224 Wound Status Wound Number: 1 Primary Etiology: Pyoderma Wound Location: Left, Lateral Lower Leg Wound Status: Open Wounding Event: Gradually Appeared Comorbid History: Sleep Apnea, Hypertension, Colitis Date Acquired: 11/18/2015 Weeks Of Treatment: 224 Clustered Wound: No Wound Measurements Length: (cm) 5.5 Width: (cm) 2.5 Depth: (cm) 0.4 Area: (cm) 10.799 Volume: (cm) 4.32 % Reduction in Area: -120% % Reduction in Volume: -10% Epithelialization: Medium (34-66%) Wound Description Classification: Full Thickness With Exposed Support Structures Exudate Amount: Medium Exudate Type: Serosanguineous Exudate Color: red, brown Foul Odor After Cleansing: No Slough/Fibrino No Wound Bed Granulation Amount: Large (67-100%) Exposed Structure Granulation Quality: Red, Hyper-granulation Fascia Exposed: No Necrotic Amount: None Present (0%) Fat Layer (Subcutaneous Tissue) Exposed: Yes Tendon Exposed: Yes Muscle Exposed: No Joint Exposed: No Bone Exposed: No Treatment Notes Wound #1 (Lower Leg) Wound Laterality: Left, Lateral Cleanser Wound Cleanser Discharge Instruction: Wash your hands with soap and water. Remove old dressing, discard into plastic bag and place into trash. Cleanse the wound with Wound Cleanser prior to applying a clean dressing using gauze sponges, not tissues or cotton balls. Do not scrub or use excessive force. Pat dry using gauze sponges, not tissue or cotton balls. Peri-Wound Care Moisturizing Lotion Discharge Instruction: Suggestions: Theraderm, Eucerin, Cetaphil, or patient preference. Topical Clobetasol Propionate ointment 0.05%, 60 (g)  tube Discharge Instruction: Apply to 7 o'clock depth Primary Dressing Hydrofera Blue Ready Transfer Foam, 4x5 (in/in) Discharge Instruction: Apply Hydrofera Blue Ready to wound bed as directed Secondary Dressing [employer] 4x5 (in/in) ESTELL, DILLINGER ([id number]) Discharge Instruction: Apply to wound as directed. Do not cut. Secured With Compression Wrap Medichoice 4 layer Compression System, 35-40 mmHG Discharge Instruction: Apply multi-layer wrap as directed. Compression Stockings Add-Ons Electronic Signature(s) Signed: 03/21/2021 12:41:53 PM By: Donnamarie Poag Entered ByDonnamarie Poag on 03/21/2021 08:26:05

## 2021-03-22 NOTE — Unmapped (Signed)
04/09/2021

## 2021-03-23 ENCOUNTER — Other Ambulatory Visit: Payer: Self-pay

## 2021-03-23 DIAGNOSIS — E11622 Type 2 diabetes mellitus with other skin ulcer: Secondary | ICD-10-CM | POA: Diagnosis not present

## 2021-03-23 NOTE — Progress Notes (Signed)
ALVAN, CULPEPPER ([id number]) Visit Report for 03/23/2021 Arrival Information Details Patient Name: [name], [name] Date of Service: 03/23/2021 8:00 AM Medical Record Number: [id number] Patient Account Number: [id number] Date of Birth/Sex: [birth year] (40 y.o. M) Treating RN: Donnamarie Poag Primary Care Anmol Paschen: [name] Other Clinician: Referring Christine Morton: [name] Treating Ozro Russett/Extender: [name] Cliche in Treatment: 64 Visit Information History Since Last Visit Added or deleted any medications: No Patient Arrived: Ambulatory Had a fall or experienced change in No Arrival Time: 08:05 activities of daily living that may affect Accompanied By: self risk of falls: Transfer Assistance: None Hospitalized since last visit: No Patient Identification Verified: Yes Has Dressing in Place as Prescribed: Yes Secondary Verification Process Completed: Yes Has Compression in Place as Prescribed: Yes Patient Requires Transmission-Based No Pain Present Now: No Precautions: Patient Has Alerts: Yes Patient Alerts: Patient has reaction to silver dressings. Electronic Signature(s) Signed: 03/23/2021 12:53:36 PM By: Donnamarie Poag Entered By: Donnamarie Poag on 03/23/2021 08:18:36 [name] ([id number]) -------------------------------------------------------------------------------- Clinic Level of Care Assessment Details Patient Name: [name], [name] Date of Service: 03/23/2021 8:00 AM Medical Record Number: [id number] Patient Account Number: [id number] Date of Birth/Sex: 29-Mar-1981 (40 y.o. M) Treating RN: Donnamarie Poag Primary Care Dayne Chait: [name] Other Clinician: Referring Hobert Poplaski: [name] Treating Nayel Purdy/Extender: [name] Cliche in Treatment: [facility] Level of Care Assessment Items TOOL 1 Quantity Score []  - Use when EandM and Procedure is performed on INITIAL visit 0 ASSESSMENTS - Nursing Assessment / Reassessment []  - General Physical Exam (combine w/  comprehensive assessment (listed just below) when performed on new 0 pt. evals) []  - 0 Comprehensive Assessment (HX, ROS, Risk Assessments, Wounds Hx, etc.) ASSESSMENTS - Wound and Skin Assessment / Reassessment []  - Dermatologic / Skin Assessment (not related to wound area) 0 ASSESSMENTS - Ostomy and/or Continence Assessment and Care []  - Incontinence Assessment and Management 0 []  - 0 Ostomy Care Assessment and Management (repouching, etc.) PROCESS - Coordination of Care []  - Simple Patient / Family Education for ongoing care 0 []  - 0 Complex (extensive) Patient / Family Education for ongoing care []  - 0 Staff obtains Programmer, systems, Records, Test Results / Process Orders []  - 0 Staff telephones HHA, Nursing Homes / Clarify orders / etc []  - 0 Routine Transfer to another Facility (non-emergent condition) []  - 0 Routine Hospital Admission (non-emergent condition) []  - 0 New Admissions / Biomedical engineer / Ordering NPWT, Apligraf, etc. []  - 0 Emergency Hospital Admission (emergent condition) PROCESS - Special Needs []  - Pediatric / Minor Patient Management 0 []  - 0 Isolation Patient Management []  - 0 Hearing / Language / Visual special needs []  - 0 Assessment of Community assistance (transportation, D/C planning, etc.) []  - 0 Additional assistance / Altered mentation []  - 0 Support Surface(s) Assessment (bed, cushion, seat, etc.) INTERVENTIONS - Miscellaneous []  - External ear exam 0 []  - 0 Patient Transfer (multiple staff / Civil Service fast streamer / Similar devices) []  - 0 Simple Staple / Suture removal (25 or less) []  - 0 Complex Staple / Suture removal (26 or more) []  - 0 Hypo/Hyperglycemic Management (do not check if billed separately) []  - 0 Ankle / Brachial Index (ABI) - do not check if billed separately Has the patient been seen at the hospital within the last three years: Yes Total Score: 0 Level Of Care: ____ [name] ([id number]) Electronic  Signature(s) Signed: 03/23/2021 12:53:36 PM By: Donnamarie Poag Entered By: Donnamarie Poag on 03/23/2021 08:22:21 Retana, [name] ([id number]) -------------------------------------------------------------------------------- Compression Therapy Details Patient Name: [name]  Date of Service: 03/23/2021 8:00 AM Medical Record Number: [id number] Patient Account Number: [id number] Date of Birth/Sex: 1981-02-16 (40 y.o. M) Treating RN: Donnamarie Poag Primary Care Greenleigh Kauth: [name] Other Clinician: Referring Jostin Rue: [name] Treating Aristides Luckey/Extender: [name] Cliche in Treatment: 224 Compression Therapy Performed for Wound Assessment: Wound #1 Left,Lateral Lower Leg Performed By: Junius Argyle, RN Compression Type: Four Layer Electronic Signature(s) Signed: 03/23/2021 12:53:36 PM By: Donnamarie Poag Entered By: Donnamarie Poag on 03/23/2021 08:21:31 [name] ([id number]) -------------------------------------------------------------------------------- Encounter Discharge Information Details Patient Name: [name] Date of Service: 03/23/2021 8:00 AM Medical Record Number: [id number] Patient Account Number: [id number] Date of Birth/Sex: 12/07/1980 (40 y.o. M) Treating RN: Donnamarie Poag Primary Care Larico Dimock: [name] Other Clinician: Referring Tawonda Legaspi: [name] Treating Maelee Hoot/Extender: [name] Cliche in Treatment: 224 Encounter Discharge Information Items Discharge Condition: Stable Ambulatory Status: Ambulatory Discharge Destination: Home Telephoned: Yes Orders Sent: Yes Transportation: Private Auto Accompanied By: self Schedule Follow-up Appointment: Yes Clinical Summary of Care: Electronic Signature(s) Signed: 03/23/2021 12:53:36 PM By: Donnamarie Poag Entered By: Donnamarie Poag on 03/23/2021 08:22:12 [name] ([id number]) -------------------------------------------------------------------------------- Wound Assessment Details Patient Name:  [name] Date of Service: 03/23/2021 8:00 AM Medical Record Number: [id number] Patient Account Number: [id number] Date of Birth/Sex: Aug 16, 1980 (40 y.o. M) Treating RN: Donnamarie Poag Primary Care Brayson Livesey: [name] Other Clinician: Referring Jory Welke: [name] Treating Ruth Tully/Extender: [name] Cliche in Treatment: 224 Wound Status Wound Number: 1 Primary Etiology: Pyoderma Wound Location: Left, Lateral Lower Leg Wound Status: Open Wounding Event: Gradually Appeared Comorbid History: Sleep Apnea, Hypertension, Colitis Date Acquired: 11/18/2015 Weeks Of Treatment: 224 Clustered Wound: No Wound Measurements Length: (cm) 5.5 Width: (cm) 2.5 Depth: (cm) 0.4 Area: (cm) 10.799 Volume: (cm) 4.32 % Reduction in Area: -120% % Reduction in Volume: -10% Epithelialization: Medium (34-66%) Wound Description Classification: Full Thickness With Exposed Support Structures Exudate Amount: Medium Exudate Type: Serosanguineous Exudate Color: red, brown Foul Odor After Cleansing: No Slough/Fibrino No Wound Bed Granulation Amount: Large (67-100%) Exposed Structure Granulation Quality: Red, Hyper-granulation Fascia Exposed: No Necrotic Amount: None Present (0%) Fat Layer (Subcutaneous Tissue) Exposed: Yes Tendon Exposed: Yes Muscle Exposed: No Joint Exposed: No Bone Exposed: No Treatment Notes Wound #1 (Lower Leg) Wound Laterality: Left, Lateral Cleanser Wound Cleanser Discharge Instruction: Wash your hands with soap and water. Remove old dressing, discard into plastic bag and place into trash. Cleanse the wound with Wound Cleanser prior to applying a clean dressing using gauze sponges, not tissues or cotton balls. Do not scrub or use excessive force. Pat dry using gauze sponges, not tissue or cotton balls. Peri-Wound Care Moisturizing Lotion Discharge Instruction: Suggestions: Theraderm, Eucerin, Cetaphil, or patient preference. Topical Clobetasol Propionate  ointment 0.05%, 60 (g) tube Discharge Instruction: Apply to 7 o'clock depth Primary Dressing Hydrofera Blue Ready Transfer Foam, 4x5 (in/in) Discharge Instruction: Apply Hydrofera Blue Ready to wound bed as directed Secondary Dressing [employer] 4x5 (in/in) ALCUS, BRADLY ([id number]) Discharge Instruction: Apply to wound as directed. Do not cut. Secured With Compression Wrap Medichoice 4 layer Compression System, 35-40 mmHG Discharge Instruction: Apply multi-layer wrap as directed. Compression Stockings Add-Ons Electronic Signature(s) Signed: 03/23/2021 12:53:36 PM By: Donnamarie Poag Entered ByDonnamarie Poag on 03/23/2021 08:18:58

## 2021-03-26 ENCOUNTER — Other Ambulatory Visit: Payer: Self-pay

## 2021-03-26 ENCOUNTER — Encounter: Payer: 59 | Admitting: Physician Assistant

## 2021-03-26 DIAGNOSIS — E11622 Type 2 diabetes mellitus with other skin ulcer: Secondary | ICD-10-CM | POA: Diagnosis not present

## 2021-03-26 NOTE — Progress Notes (Addendum)
FILMORE, MOLYNEUX ([id number]) Visit Report for 03/26/2021 Chief Complaint Document Details Patient Name: [name], [name] Date of Service: 03/26/2021 8:15 AM Medical Record Number: [id number] Patient Account Number: [id number] Date of Birth/Sex: [birth year] (40 y.o. 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 M) Treating RN: [name] Primary Care Provider: [name] Other Clinician: Referring Provider: [name] Treating Provider/Extender: [name] Cliche in Treatment: 56 History of Present Illness HPI Description: 12/04/16; 40 year old man who comes into the clinic today for review of a wound on the posterior left calf. He tells me that is been there for about a year. He is not a diabetic he does smoke half a pack per day. He was seen in the ER on 11/20/16 felt to have cellulitis around the wound and was given clindamycin. An x-ray did not show osteomyelitis. The patient initially tells me that he has a milk allergy that sets off a pruritic itching rash on his lower legs which she scratches incessantly and he thinks that's what may have set up the wound. He has been using various topical antibiotics and ointments without any effect. He works in a [employer] and is on his feet all day. He does not have a prior history of  wounds however he does have the rash on both lower legs the right arm and the ventral aspect of his left arm. These are excoriations and clearly have had scratching however there are of macular looking areas on both legs including a substantial larger area on the right leg. This does not have an underlying open area. There is no blistering. The patient tells me that 2 years ago in [place] in response to the rash on his legs he saw a dermatologist who told him he had a condition which may be pyoderma gangrenosum although I may be putting words into his mouth. He seemed to recognize this. On further questioning he admits to a 5 year history of quiesced. ulcerative colitis. He is not in any treatment for this. He's had no recent travel 12/11/16; the patient arrives today with his wound and roughly the same condition we've been using silver alginate this is a deep punched out wound with some surrounding erythema but no tenderness. Biopsy I did did not show confirmed pyoderma gangrenosum suggested nonspecific inflammation and vasculitis but does not provide an actual description of what was seen by the pathologist. I'm really not able to understand this We have also received information from the patient's dermatologist in [place] notes from April 2016. This was a doctor Agarwal-antal. 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 He was prescribed topical steroid high potency under occlusion which helped but at this point the patient did not have a deep punched out wound. 12/18/16; the patient's wound is larger in terms of surface area however this surface looks better and there is less depth. The surrounding erythema also is better. The patient states that the wrap we put on came off 2 days ago when he has been using his compression stockings.  He we are in the process of getting a dermatology consult. 12/26/16 on evaluation today patient's left lower extremity wound shows evidence of infection with  surrounding erythema noted. He has been tolerating the dressing changes but states that he has noted more discomfort. There is a larger area of erythema surrounding the wound. No fevers, chills, nausea, or vomiting noted at this time. With that being said the wound still does have slough covering the surface. He is not allergic to any medication that he is aware of at this point. In regard to his right lower extremity he had several regions that are erythematous and pruritic he wonders if there's anything we can do to help that. 01/02/17 I reviewed patient's wound culture which was obtained his visit last week. He was placed on doxycycline at that point. Unfortunately that does not appear to be an antibiotic that would likely help with the situation however the pseudomonas noted on culture is sensitive to Cipro. Also unfortunately patient's wound seems to have a large compared to last week's evaluation. Not severely so but there are definitely increased measurements in general. He is continuing to have discomfort as well he writes this to be a seven out of 10. In fact he would prefer me not to perform any debridement today due to the fact that he is having discomfort and considering he has an active infection on the little reluctant to do so anyway. No fevers, chills, nausea, or vomiting noted at this time. 01/08/17; patient seems dermatology on September 5. I suspect dermatology will want the slides from the biopsy I did sent to their pathologist. I'm not sure if there is a way we can expedite that. In any case the culture I did before I left on vacation 3 weeks ago showed Pseudomonas he was given 10 days of Cipro and per her description of her intake nurses is actually somewhat better this week although the wound is quite a bit bigger than I remember the last time I saw this. He still has 3 more days of Cipro 01/21/17; dermatology appointment tomorrow. He has completed the ciprofloxacin for Pseudomonas.  Surface of the wound looks better however he is had some deterioration in the lesions on his right leg. Meantime the left lateral leg wound we will continue with sample 01/29/17; patient had his dermatology appointment but I can't yet see that note. He is completed his antibiotics. The wound is more superficial but considerably larger in circumferential area than when he came in. This is in his left lateral calf. He also has swollen erythematous areas with superficial wounds on the right leg and small papular areas on both arms. There apparently areas in her his upper thighs and buttocks I did not look at those. Dermatology biopsied the right leg. Hopefully will have their input next week. 02/05/17; patient went back to see his dermatologist who told him that he had a "scratching problem" as well as staph. He is now on a 30 day course of doxycycline and I believe she gave him triamcinolone cream to the right leg areas to help with the itching [not exactly sure but probably triamcinolone]. She apparently looked at the left lateral leg wound although this was not rebiopsied and I think felt to be ultimately part of the same pathogenesis. He is using sample border foam and changing nevus himself. He now has a new open area on the right posterior leg which was his biopsy site I don't have any of the dermatology notes  02/12/17; we put the patient in compression last week with SANTYL to the wound on the left leg and the biopsy. Edema is much better and the depth of the wound is now at level of skin. Area is still the same oBiopsy site on the right lateral leg we've also been using santyl with a border foam dressing and he is changing this himself. 02/19/17; Using silver alginate started last week to both the substantial left leg wound and the biopsy site on the right wound. He is tolerating compression well. Has a an appointment with his primary M.D. tomorrow wondering about diuretics although I'm wondering if  the edema problem is actually lymphedema 02/26/17; the patient has been to see his primary doctor Dr. Jerrel Ivory at [place] our primary care. She started him on Lasix 20 mg and this seems to have helped with the edema. However we are not making substantial change with the left lateral calf wound and inflammation. The biopsy site on the right leg also looks stable but not really all that different. 03/12/17; the patient has been to see vein and vascular Dr. Lucky Cowboy. He has had venous reflux studies I have not reviewed these. I did get a call from his dermatology office. They felt that he might have pathergy based on their biopsy on his right leg which led them to look at the slides of GUMECINDO, HOPKIN ([id number]) the biopsy I did on the left leg and they wonder whether this represents pyoderma gangrenosum which was the original supposition in a man with ulcerative colitis albeit inactive for many years. They therefore recommended clobetasol and tetracycline i.e. aggressive treatment for possible pyoderma gangrenosum. 03/26/17; apparently the patient just had reflux studies not an appointment with Dr. dew. She arrives in clinic today having applied clobetasol for 2-3 weeks. He notes over the last 2-3 days excessive drainage having to change the dressing 3-4 times a day and also expanding erythema. 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 Lillia Carmel at [facility] dermatology locally who kindly saw him at my request. 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 My concern was the degree of erythema medially extending from his left leg wound which was either inflammation from pyoderma or cellulitis. I  put him on Augmentin however culture of the wound showed Pseudomonas which is quinolone sensitive. I really don't believe he has cellulitis however in view of everything I will continue and give him a course of Cipro. He is also on doxycycline as an immune modulator for the pyoderma. In addition to his original wound on the left lateral leg with surrounding erythema he has a wound on the right posterior calf which was an original biopsy site done by dermatology. 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 Saw Dr. Lillia Carmel yesterday. He has been using topical antibiotics to both wound areas his original wound on the left and the biopsies/pathergy area on the right. There is definitely some improvement in the inflammation around the wound on the right although the patient states he has increasing sensitivity of the wounds. He is on prednisone 60 and doxycycline 1 as prescribed by Dr. Lillia Carmel. He is covering the topical antibiotic with gauze  and putting this in his own compression stocks and changing this daily. He states that Dr. Lottie Rater did a culture of the left leg wound yesterday 05/07/17; pyoderma gangrenosum. The patient saw Dr. Lillia Carmel yesterday and has a follow-up with her in one month. He is still using topical antibiotics to both wounds although he can't recall exactly what type. He is still on prednisone 60 mg. Dr. Lillia Carmel stated that the doxycycline could stop if we were in agreement. 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 The right medial leg was induced by biopsy/pathergy. The area on the right is essentially healed. 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 The right medial leg has closed and remains closed. He is still on  prednisone 60. oHe tells me he missed his last dermatology appointment with Dr. Lillia Carmel but will make another appointment. He reports that her blood sugar at a recent screen in [place] was high 200's. He was 180 today. 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 I think he is going to require still much longer prednisone perhaps another 3 months before attempting to taper. In the meantime his wound is a lot better. Smaller. He is cleaning this off daily and applying topical antibiotics. When he was last in the clinic I thought about changing to [facility] and actually put in a couple of calls to dermatology although probably not during their business hours. 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 He continues on oral prednisone. He has been using triple antibiotic ointment. There is surface debris and we will transition to [facility] and have him return in 2 weeks. He has lost 30 pounds since his last appointment with lifestyle modification. He may benefit from topical steroid cream for treatment this can be considered at a later date. 08/22/17 on evaluation today patient appears to actually be doing rather well in regard to his left lateral lower extremity ulcer. He has actually been managed by Dr. Dellia Nims most recently. Patient is currently on oral steroids at this time. This seems to have been of benefit for him. Nonetheless his last visit was actually with Leah on 08/06/17. Currently he is not utilizing any topical steroid creams although this could be of benefit as well. No fevers, chills, nausea, or vomiting noted at this time. 09/05/17 on evaluation today patient appears to be doing better in regard to his left lateral lower extremity ulcer. He has been tolerating the dressing changes without complication. He is using Santyl with good effect. Overall I'm very  pleased with how things are standing at this point. Patient likewise is happy that this is doing better. 09/19/17 on evaluation today patient actually appears to be doing rather well in regard to his left lateral lower extremity ulcer. 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 He's not having any significant pain. 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 He has been tolerating the Santyl without complication and in general I feel like he's making good progress. 10/17/17 on evaluation today patient  appears to be doing very well in regard to his left lateral lower surety ulcer. He has been tolerating the dressing changes without complication. There does not appear to be any evidence of infection he's alternating the Santyl and the triple antibiotic ointment every other day this seems to be doing well for him. 11/03/17 on evaluation today patient appears to be doing very well in regard to his left lateral lower extremity ulcer. He is been tolerating the dressing changes without complication which is good news. Fortunately there does not appear to be any evidence of infection which is also great news. Overall is doing excellent they are starting to taper down on the prednisone is down to 40 mg at this point it also started topical clobetasol for him. 11/17/17 on evaluation today patient appears to be doing well in regard to his left lateral lower surety ulcer. He's been tolerating the dressing changes without complication. He does note that he is having no pain, no excessive drainage or discharge, and overall he feels like things are going about how he would expect and hope they would. Overall he seems to have no evidence of infection at this time in my opinion which is good news. 12/04/17-He is seen in follow-up evaluation  for right lateral lower extremity ulcer. He has been applying topical steroid cream. Today's measurement show slight increase in size. Over the next 2 weeks we will transition to every other day Santyl and steroid cream. He has been encouraged to monitor for changes and notify clinic with any concerns 12/15/17 on evaluation today patient's left lateral motion the ulcer and fortunately is doing worse again at this point. This just since last week to this week has close to doubled in size according to the patient. I did not seeing last week's I do not have a visual to compare this to in our system was also down so we do not have all the charts and at this point. Nonetheless it does have me somewhat concerned in regard to the fact that again he was worried enough about it he has contact the dermatology that placed them back on the full strength, 50 mg a day of the prednisone that he was taken previous. He continues to alternate using clobetasol along with Santyl at this point. He is obviously somewhat frustrated. 12/22/17 on evaluation today patient appears to be doing a little worse compared to last evaluation. Unfortunately the wound is a little deeper and slightly larger than the last week's evaluation. With that being said he has made some progress in regard to the irritation surrounding at this time unfortunately despite that progress that's been made he still has a significant issue going on here. 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 PARIS, CHIRIBOGA ([id number]) For that reason I discussed with him today the possibility of perform a wound culture to ensure there's nothing overtly infected. 01/06/18 on evaluation today patient's wound is larger and deeper than previously evaluated. With that being said it did appear that his wound was infected after  my last evaluation with him. Subsequently I did end up prescribing a prescription for Bactrim DS which she has been taking and having no complication with. Fortunately there does not appear to be any evidence of infection at this point in time as far as anything spreading, no want to touch, and overall I feel like  things are showing signs of improvement. 01/13/18 on evaluation today patient appears to be even a little larger and deeper than last time. There still muscle exposed in the base of the wound. Nonetheless he does appear to be less erythematous I do believe inflammation is calming down also believe the infection looks like it's probably resolved at this time based on what I'm seeing. No fevers, chills, nausea, or vomiting noted at this time. 01/30/18 on evaluation today patient actually appears to visually look better for the most part. Unfortunately those visually this looks better he does seem to potentially have what may be an abscess in the muscle that has been noted in the central portion of the wound. This is the first time that I have noted what appears to be fluctuance in the central portion of the muscle. With that being said I'm somewhat more concerned about the fact that this might indicate an abscess formation at this location. I do believe that an ultrasound would be appropriate. This is likely something we need to try to do as soon as possible. He has been switch to mupirocin ointment and he is no longer using the steroid ointment as prescribed by dermatology he sees them again next week he's been decreased from 60 to 40 mg of prednisone. 03/09/18 on evaluation today patient actually appears to be doing a little better compared to last time I saw him. There's not as much erythema surrounding the wound itself. He I did review his most recent infectious disease note which was dated 02/24/18. He saw Dr. Michel Bickers in [place]. 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 Patient is now off of antibiotics and they are continually observing this. There seems to be no change in the past two weeks in my pinion based on what the patient says and what I see today compared to what Dr. Megan Salon likely saw two weeks ago. No fevers, chills, nausea, or vomiting noted at this time. 03/23/18 on evaluation today patient's wound actually appears to be showing signs of improvement which is good news. He is currently still on the Dapsone. He is also working on tapering the prednisone to get off of this and Dr. Lottie Rater is working with him in this regard. Nonetheless overall I feel like the wound is doing well it does appear based on the infectious disease note that I reviewed from Dr. 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 I did also review the results of his ultrasound of left lower extremity which revealed there was a dentist tissue in the base of the wound without an abscess noted. 04/06/18 on evaluation today the patient's left lateral lower extremity ulcer actually appears to be doing fairly well which is excellent news. There does not appear to be any evidence of infection at this time which is also great news. Overall he still does have a significantly large ulceration although little by little he seems to be making progress. He is down to 10 mg a day of the prednisone. 04/20/18 on evaluation today patient actually appears to be doing excellent at this time in regard to his left lower extremity ulcer. He's making signs of good progress unfortunately this is taking much longer than we would really like to see but nonetheless he is  making progress. Fortunately there does not appear to be any evidence of infection at this time. No fevers, chills, nausea, or vomiting noted at this time.  The patient has not been using the Santyl due to the cost he hadn't got in this field yet. He's mainly been using the antibiotic ointment topically. Subsequently he also tells me that he really has not been scrubbing in the shower I think this would be helpful again as I told him it doesn't have to be anything too aggressive to even make it believe just enough to keep it free of some of the loose slough and biofilm on the wound surface. 05/11/18 on evaluation today patient's wound appears to be making slow but sure progress in regard to the left lateral lower extremity ulcer. He is been tolerating the dressing changes without complication. Fortunately there does not appear to be any evidence of infection at this time. He is still just using triple antibiotic ointment along with clobetasol occasionally over the area. He never got the Santyl and really does not seem to intend to in my pinion. 06/01/18 on evaluation today patient appears to be doing a little better in regard to his left lateral lower extremity ulcer. He states that overall he does not feel like he is doing as well with the Dapsone as he did with the prednisone. Nonetheless he sees his dermatologist later today and is gonna talk to them about the possibility of going back on the prednisone. Overall again I believe that the wound would be better if you would utilize Santyl but he really does not seem to be interested in going back to the [place] at this point. He has been using triple antibiotic ointment. 06/15/18 on evaluation today patient's wound actually appears to be doing about the same at this point. Fortunately there is no signs of infection at this time. He has made slight improvements although he continues to not really want to clean the wound bed at this point. He states that he just doesn't mess with it he doesn't want to cause any problems with everything else he has going on. He has been on medication, antibiotics as prescribed  by his dermatologist, for a staff infection of his lower extremities which is really drying out now and looking much better he tells me. Fortunately there is no sign of overall infection. 06/29/18 on evaluation today patient appears to be doing well in regard to his left lateral lower surety ulcer all things considering. Fortunately his staff infection seems to be greatly improved compared to previous. He has no signs of infection and this is drying up quite nicely. He is still the doxycycline for this is no longer on cental, Dapsone, or any of the other medications. His dermatologist has recommended possibility of an infusion but right now he does not want to proceed with that. 07/13/18 on evaluation today patient appears to be doing about the same in regard to his left lateral lower surety ulcer. Fortunately there's no signs of infection at this time which is great news. Unfortunately he still builds up a significant amount of Slough/biofilm of the surface of the wound he still is not really cleaning this as he should be appropriately. Again I'm able to easily with saline and gauze remove the majority of this on the surface which if you would do this at home would likely be a dramatic improvement for him as far as getting the area to improve. Nonetheless overall I still feel like he  is making progress is just very slow. I think Santyl will be of benefit for him as well. Still he has not gotten this as of this point. 07/27/18 on evaluation today patient actually appears to be doing little worse in regards of the erythema around the periwound region of the wound he also tells me that he's been having more drainage currently compared to what he was experiencing last time I saw him. He states not quite as bad as what he had because this was infected previously but nonetheless is still appears to be doing poorly. Fortunately there is no evidence of systemic infection at this point. The patient tells me that  he is not going to be able to afford the Santyl. He is still waiting to hear about the infusion therapy with his dermatologist. Apparently she wants an updated colonoscopy first. 08/10/18 on evaluation today patient appears to be doing better in regard to his left lateral lower extremity ulcer. Fortunately he is showing signs of improvement in this regard he's actually been approved for Remicade infusion's as well although this has not been scheduled as of yet. Fortunately there's no signs of active infection at this time in regard to the wound although he is having some issues with infection of the right lower extremity is been seen as dermatologist for this. Fortunately they are definitely still working with him trying to keep things under control. ERRIC, MACHNIK ([id number]) 09/07/18 on evaluation today patient is actually doing rather well in regard to his left lateral lower extremity ulcer. He notes these actually having some hair grow back on his extremity which is something he has not seen in years. He also tells me that the pain is really not giving them any trouble at this time which is also good news overall she is very pleased with the progress he's using a combination of the mupirocin along with the probate is all mixed. 09/21/18 on evaluation today patient actually appears to be doing fairly well all things considered in regard to his looks from the ulcer. He's been tolerating the dressing changes without complication. Fortunately there's no signs of active infection at this time which is good news he is still on all antibiotics or prevention of the staff infection. He has been on prednisone for time although he states it is gonna contact his dermatologist and see if she put them on a short course due to some irritation that he has going on currently. Fortunately there's no evidence of any overall worsening this is going very slow I think cental would be something that would be helpful for him  although he states that $50 for tube is quite expensive. He therefore is not willing to get that at this point. 10/06/18 on evaluation today patient actually appears to be doing decently well in regard to his left lateral leg ulcer. He's been tolerating the dressing changes without complication. Fortunately there's no signs of active infection at this time. Overall I'm actually rather pleased with the progress he's making although it's slow he doesn't show any signs of infection and he does seem to be making some improvement. I do believe that he may need a switch up and dressings to try to help this to heal more appropriately and quickly. 10/19/18 on evaluation today patient actually appears to be doing better in regard to his left lateral lower extremity ulcer. This is shown signs of having much less Slough buildup at this point due to the fact he has been using  the Santyl. Obviously this is very good news. The overall size of the wound is not dramatically smaller but again the appearance is. 11/02/18 on evaluation today patient actually appears to be doing quite well in regard to his lower [place] ulcer. A lot of the skin around the ulcer is actually somewhat irritating at this point this seems to be more due to the dressing causing irritation from the adhesive that anything else. Fortunately there is no signs of active infection at this time. 11/24/18 on evaluation today patient appears to be doing a little worse in regard to his overall appearance of his lower extremity ulcer. There's more erythema and warmth around the wound unfortunately. He is currently on doxycycline which he has been on for some time. With that being said I'm not sure that seems to be helping with what appears to possibly be an acute cellulitis with regard to his left lower extremity ulcer. No fevers, chills, nausea, or vomiting noted at this time. 12/08/18 on evaluation today patient's wounds actually appears to be doing  significantly better compared to his last evaluation. He has been using Santyl along with alternating tripling about appointment as well as the steroid cream seems to be doing quite well and the wound is showing signs of improvement which is excellent news. Fortunately there's no evidence of infection and in fact his culture came back negative with only normal skin flora noted. 12/21/2018 upon evaluation today patient actually appears to be doing excellent with regard to his ulcer. This is actually the best that I have seen it since have been helping to take care of him. It is both smaller as well as less slough noted on the surface of the wound and seems to be showing signs of good improvement with new skin growing from the edges. He has been using just the triamcinolone he does wonder if he can get a refill of that ointment today. 01/04/2019 upon evaluation today patient actually appears to be doing well with regard to his left lateral lower extremity ulcer. With that being said it does not appear to be that he is doing quite as well as last time as far as progression is concerned. There does not appear to be any signs of infection or significant irritation which is good news. With that being said I do believe that he may benefit from switching to a collagen based dressing based on how clean The wound appears. 01/18/2019 on evaluation today patient actually appears to be doing well with regard to his wound on the left lower extremity. He is not made a lot of progress compared to where we were previous but nonetheless does seem to be doing okay at this time which is good news. There is no signs of active infection which is also good news. My only concern currently is I do wish we can get him into utilizing the collagen dressing his insurance would not pay for the supplies that we ordered although it appears that he may be able to order this through his supply company that he typically utilizes. This is  [employer]. Nonetheless he did try to order it during the office visit today and it appears this did go through. We will see if he can get that it is a different brand but nonetheless he has collagen and I do think will be beneficial. 02/01/2019 on evaluation today patient actually appears to be doing a little worse today in regard to the overall size of his wounds. Fortunately there  is no signs of active infection at this time. That is visually. Nonetheless when this is happened before it was due to infection. For that reason were somewhat concerned about that this time as well. 02/08/2019 on evaluation today patient unfortunately appears to be doing slightly worse with regard to his wound upon evaluation today. Is measuring a little deeper and a little larger unfortunately. I am not really sure exactly what is causing this to enlarge he actually did see his dermatologist she is going to see about initiating Humira for him. Subsequently she also did do steroid injections into the wound itself in the periphery. Nonetheless still nonetheless he seems to be getting a little bit larger he is gone back to just using the steroid cream topically which I think is appropriate. I would say hold off on the collagen for the time being is definitely a good thing to do. Based on the culture results which we finally did get the final result back regarding it shows staph as the bacteria noted again that can be a normal skin bacteria based on the fact however he is having increased drainage and worsening of the wound measurement wise I would go ahead and place him on an antibiotic today I do believe for this. 02/15/2019 on evaluation today patient actually appears to be doing somewhat better in regard to his ulcer. There is no signs of worsening at this time I did review his culture results which showed evidence of Staphylococcus aureus but not MRSA. Again this could just be more related to the normal skin bacteria  although he states the drainage has slowed down quite a bit he may have had a mild infection not just colonization. And was much smaller and then since around10/04/2019 on evaluation today patient appears to be doing unfortunately worse as far as the size of the wound. I really feel like that this is steadily getting larger again it had been doing excellent right at the beginning of September we have seen a steady increase in the area of the wound it is almost 2-1/2 times the size it was on September 1. Obviously this is a bad trend this is not wanting to see. For that reason we went back to using just the topical triamcinolone cream which does seem to help with inflammation. I checked him for bacteria by way of culture and nothing showed positive there. I am considering giving him a short course of a tapering steroid Dosepak today to see if that is can be beneficial for him. The patient is in agreement with giving that a try. 03/08/2019 on evaluation today patient appears to be doing very well in comparison to last evaluation with regard to his lower extremity ulcer. This is showing signs of less inflammation and actually measuring slightly smaller compared to last time every other week over the past month and a half he has been measuring larger larger larger. Nonetheless I do believe that the issue has been inflammation the prednisone does seem to [facility] [place] ([id number]) have been beneficial for him which is good news. No fevers, chills, nausea, vomiting, or diarrhea. 03/22/2019 on evaluation today patient appears to be doing about the same with regard to his leg ulcer. He has been tolerating the dressing changes without complication. With that being said the wound seems to be mostly arrested at its current size but really is not making any progress except for when we prescribed the prednisone. He did show some signs of dropping as far as  the overall size of the wound during that interval week.  Nonetheless this is something he is not on long-term at this point and unfortunately I think he is getting need either this or else the Humira which his dermatologist has discussed try to get approval for. With that being said he will be seeing his dermatologist on the 11th of this month that is November. 04/19/2019 on evaluation today patient appears to be doing really about the same the wound is measuring slightly larger compared to last time I saw him. He has not been into the office since November 2 due to the fact that he unfortunately had Covid as that his entire family. He tells me that it was rough but they did pull-through and he seems to be doing much better. Fortunately there is no signs of active infection at this time. No fevers, chills, nausea, vomiting, or diarrhea. 05/10/2019 on evaluation today patient unfortunately appears to be doing significantly worse as compared to last time I saw him. He does tell me that he has had his first dose of Humira and actually is scheduled to get the next one in the upcoming week. With that being said he tells me also that in the past several days he has been having a lot of issues with green drainage she showed me a picture this is more blue-green in color. He is also been having issues with increased sloughy buildup and the wound does appear to be larger today. Obviously this is not the direction that we want everything to take based on the starting of his Humira. Nonetheless I think this is definitely a result of likely infection and to be honest I think this is probably Pseudomonas causing the infection based on what I am seeing. 05/24/2019 on evaluation today patient unfortunately appears to be doing significantly worse compared to his prior evaluation with me 2 weeks ago. I did review his culture results which showed that he does have Staph aureus as well as Pseudomonas noted on the culture. Nonetheless the Levaquin that I prescribed for him does  not appear to have been appropriate and in fact he tells me he is no longer experiencing the green drainage and discharge that he had at the last visit. Fortunately there is no signs of active infection at this time which is good news although the wound has significantly worsened it in fact is much deeper than it was previous. We have been utilizing up to this point triamcinolone ointment as the prescription topical of choice but at this time I really feel like that the wound is getting need to be packed in order to appropriately manage this due to the deeper nature of the wound. Therefore something along the lines of an alginate dressing may be more appropriate. 05/31/2019 upon inspection today patient's wound actually showed signs of doing poorly at this point. Unfortunately he just does not seem to be making any good progress despite what we have tried. He actually did go ahead and pick up the Cipro and start taking that as he was noticing more green drainage he had previously completed the Levaquin that I prescribed for him as well. Nonetheless he missed his appointment for the seventh last week on Wednesday with the wound care center and [facility] where his dermatologist referred him. Obviously I do think a second opinion would be helpful at this point especially in light of the fact that the patient seems to be doing so poorly despite the fact  that we have tried everything that I really know how at this point. The only thing that ever seems to have helped him in the past is when he was on high doses of continual steroids that did seem to make a difference for him. 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 06/07/2019 upon evaluation today patient unfortunately appears to be doing worse yet again with regard to his wound. In fact I am  starting to question whether or not he may have a fluid pocket in the muscle at this point based on the bulging and the soft appearance to the central portion of the muscle area. There is not anything draining from the muscle itself at this time which is good news but nonetheless the wound is expanding. I am not really seeing any results of the Humira as far as overall wound progression based on what I am seeing at this point. The patient has been referred for second opinion with regard to his wound to the [facility] wound care center by his dermatologist which I definitely am not in opposition to. Unfortunately we tried multiple dressings in the past including collagen, alginate, and at one point even Hydrofera Blue. With that being said he is never really used it for any significant amount of time due to the fact that he often complains of pain associated with these dressings and then will go back to either using the Santyl which she has done intermittently or more frequently the triamcinolone. He is also using his own compression stockings. We have wrapped him in the past but again that was something else that he really was not a big fan of. 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 He seemed to do much better when he was on the steroids although honestly I understand that the steroids are not necessarily the best medication to be on long-term obviously 06/14/2019 on evaluation today patient appears to be doing actually a little bit better with regard to the overall appearance with his leg. Unfortunately he does continue to have issues with what appears to be some fluid underneath the  muscle although he did see the wound specialty center at [facility] last week their main goals were to see about infusion therapy in place of the Humira as they feel like that is not quite strong enough. They also recommended that we continue with the treatment otherwise as we are they felt like that was appropriate and they are okay with him continuing to follow-up here with [place] in that regard. With that being said they are also sending him to the vein specialist there to see about vein stripping and if that would be of benefit for him. Subsequently they also did not really address whether or not an ultrasound of the muscle area to see if there is anything that needs to be addressed here would be appropriate or not. For that reason I discussed this with him last week I think we  may proceed down that road at this point. 06/21/2019 upon evaluation today patient's wound actually appears to be doing slightly better compared to previous evaluations. I do believe that he has made a difference with regard to the progression here with the use of oral steroids. Again in the past has been the only thing that is really calm things down. He does tell me that from [facility] is gotten a good news from there that there are no further vein stripping that is necessary at this point. I do not have that available for review today although the patient did relay this to me. He also did obtain and have the ultrasound of the wound completed which I did sign off on today. It does appear that there is no fluid collection under the muscle this is likely then just edematous tissue in general. That is also good news. Overall I still believe the inflammation is the main issue here. He did inquire about the possibility of a wound VAC again with the muscle protruding like it is I am not really sure whether the wound VAC is necessarily ideal or not. That is something we will have to consider although I do believe he may need compression wrapping to  try to help with edema control which could potentially be of benefit. 06/28/2019 on evaluation today patient appears to be doing slightly better measurement wise although this is not terribly smaller he least seems to be trending towards that direction. With that being said he still seems to have purulent drainage noted in the wound bed at this time. He has been on Levaquin followed by Cipro over the past month. Unfortunately he still seems to have some issues with active infection at this time. I did perform a culture last week in order to evaluate and see if indeed there was still anything going on. Subsequently the culture did come back showing Pseudomonas which is consistent with the drainage has been having which is blue-green in color. He also has had an odor that again was somewhat consistent with Pseudomonas as well. Long story short it appears that the culture showed an intermediate finding with regard to how well the Cipro will work for the Pseudomonas infection. Subsequently being that he does not seem to be clearing up and at best what we are doing is just keeping this at [place] I think he may need to see infectious disease to discuss IV antibiotic options. DMONTE, MAHER ([id number]) 07/05/2019 upon evaluation today patient appears to be doing okay in regard to his leg ulcer. He has been tolerating the dressing changes at this point without complication. Fortunately there is no signs of active infection at this time which is good news. No fevers, chills, nausea, vomiting, or diarrhea. With that being said he does have an appointment with infectious disease tomorrow and his primary care on Wednesday. Again the reason for the infectious disease referral was due to the fact that he did not seem to be fully resolving with the use of oral antibiotics and therefore we were thinking that IV antibiotic therapy may be necessary secondary to the fact that there was an intermediate finding for  how effective the Cipro may be. Nonetheless again he has been having a lot of purulent and even green drainage. Fortunately right now that seems to have calmed down over the past week with the reinitiation of the oral antibiotic. Nonetheless we will see what Dr. Megan Salon has to say. 07/12/2019 upon evaluation today patient appears to be  doing about the same at this point in regard to his left lower extremity ulcer. Fortunately there is no signs of active infection at this time which is good news I do believe the Levaquin has been beneficial I did review Dr. Hale Bogus note and to be honest I agree that the patient's leg does appear to be doing better currently. What we found in the past as he does not seem to really completely resolve he will stop the antibiotic and then subsequently things will revert back to having issues with blue-green drainage, increased pain, and overall worsening in general. Obviously that is the reason I sent him back to infectious disease. 07/19/2019 upon evaluation today patient appears to be doing roughly the same in size there is really no dramatic improvement. He has started back on the Levaquin at this point and though he seems to be doing okay he did still have a lot of blue/green drainage noted on evaluation today unfortunately. I think that this is still indicative more likely of a Pseudomonas infection as previously noted and again he does see Dr. Megan Salon in just a couple of days. I do not know that were really able to effectively clear this with just oral antibiotics alone based on what I am seeing currently. Nonetheless we are still continue to try to manage as best we can with regard to the patient and his wound. I do think the wrap was helpful in decreasing the edema which is excellent news. No fevers, chills, nausea, vomiting, or diarrhea. 07/26/2019 upon evaluation today patient appears to be doing slightly better with regard to the overall appearance of the muscle  there is no dark discoloration centrally. Fortunately there is no signs of active infection at this time. No fevers, chills, nausea, vomiting, or diarrhea. Patient's wound bed currently the patient did have an appointment with Dr. Megan Salon at infectious disease last week. With that being said Dr. Megan Salon the patient states was still somewhat hesitant about put him on any IV antibiotics he wanted [place] to repeat cultures today and then see where things go going forward. He does look like Dr. Megan Salon because of some improvement the patient did have with the Levaquin wanted [place] to see about repeating cultures. If it indeed grows the Pseudomonas again then he recommended a possibility of considering a PICC line placement and IV antibiotic therapy. He plans to see the patient back in 1 to 2 weeks. 08/02/2019 upon evaluation today patient appears to be doing poorly with regard to his left lower extremity. We did get the results of his culture back it shows that he is still showing evidence of Pseudomonas which is consistent with the purulent/blue-green drainage that he has currently. Subsequently the culture also shows that he now is showing resistance to the oral fluoroquinolones which is unfortunate as that was really the only thing to treat the infection prior. I do believe that he is looking like this is going require IV antibiotic therapy to get this under control. Fortunately there is no signs of systemic infection at this time which is good news. The patient does see Dr. Megan Salon tomorrow. 08/09/2019 upon evaluation today patient appears to be doing better with regard to his left lower extremity ulcer in regard to the overall appearance. He is currently on IV antibiotic therapy. As ordered by Dr. Megan Salon. Currently the patient is on ceftazidime which she is going to take for the next 2 weeks and then follow-up for 4 to 5-week appointment with Dr. Megan Salon.  The patient started this this past Friday  symptoms have not for a total of 3 days currently in full. 08/16/2019 upon evaluation today patient's wound actually does show muscle in the base of the wound but in general does appear to be much better as far as the overall evidence of infection is concerned. In fact I feel like this is for the most part cleared up he still on the IV antibiotics he has not completed the full course yet but I think he is doing much better which is excellent news. 08/23/2019 upon evaluation today patient appears to be doing about the same with regard to his wound at this point. He tells me that he still has pain unfortunately. Fortunately there is no evidence of systemic infection at this time which is great news. There is significant muscle protrusion. 09/13/19 upon evaluation today patient appears to be doing about the same in regard to his leg unfortunately. He still has a lot of drainage coming from the ulceration there is still muscle exposed. With that being said the patient's last wound culture still showed an intermediate finding with regard to the Pseudomonas he still having the bluish/green drainage as well. Overall I do not know that the wound has completely cleared of infection at this point. Fortunately there is no signs of active infection systemically at this point which is good news. 09/20/2019 upon evaluation today patient's wound actually appears to be doing about the same based on what I am seeing currently. I do not see any signs of systemic infection he still does have evidence of some local infection and drainage. He did see Dr. Megan Salon last week and Dr. Megan Salon states that he probably does need a different IV antibiotic although he does not want to put him on this until the patient begins the Remicade infusion which is actually scheduled for about 10 days out from today on 13 May. Following that time Dr. Megan Salon is good to see him back and then will evaluate the feasibility of starting him on the  IV antibiotic therapy once again at that point. I do not disagree with this plan I do believe as Dr. Megan Salon stated in his note that I reviewed today that the patient's issue is multifactorial with the pyoderma being 1 aspect of this that were hoping the Remicade will be helpful for her. In the meantime I think the gentamicin is, helping to keep things under decent okay control in regard to the ulcer. 09/27/2019 upon evaluation today patient appears to be doing about the same with regard to his wound still there is a lot of muscle exposure though he does have some hyper granulation tissue noted around the edge and actually some granulation tissue starting to form over the muscle which is actually good news. Fortunately there is no evidence of active infection which is also good news. His pain is less at this point. 5/21; this is a patient I have not seen in a long time. 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 He has a large wound on the left lateral leg with protruding muscle. He comes in the clinic today showing the same area on his left medial ankle. He says there is been a spot there for some time although we have not previously defined this. Today he has a clearly defined area with slight amount of skin breakdown surrounded by raised areas with a purplish hue in color. This is not painful he says it is irritated.  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 He still has muscle protruding from the lateral portion of his left leg but fortunately the new area that they were concerned about at his last visit does not appear to have opened at this point. He is currently on Remicade infusions and seems to be doing better in my opinion in fact the wound itself seems to be overall much better. The purplish discoloration that he did have seems to have resolved  and I think that is a good sign that hopefully the Remicade is doing its job. He does have some biofilm noted over the surface of the wound. 11/01/2019 on evaluation today patient's wound actually appears to be doing excellent at this time. Fortunately there is no evidence of active infection and overall I feel like he is making great progress. The Remicade seems to be due excellent job in my opinion. DENISE, BRAMBLETT ([id number]) 11/08/19 evaluation today vision actually appears to be doing quite well with regard to his weight ulcer. He's been tolerating dressing changes without complication. Fortunately there is no evidence of infection. No fevers, chills, nausea, or vomiting noted at this time. Overall states that is having more itching than pain which is actually a good sign in my opinion. 12/13/2019 upon evaluation today patient appears to be doing well today with regard to his wound. He has been tolerating the dressing changes without complication. Fortunately there is no sign of active infection at this time. No fevers, chills, nausea, vomiting, or diarrhea. Overall I feel like the infusion therapy has been very beneficial for him. 01/06/2020 on evaluation today patient appears to be doing well with regard to his wound. This is measuring smaller and actually looks to be doing better. Fortunately there is no signs of active infection at this point. No fevers, chills, nausea, vomiting, or diarrhea. With that being said he does still have the blue-green drainage but this does not seem to be causing any significant issues currently. He has been using the gentamicin that does seem to be keeping things under decent control at this point. He goes later this morning for his next infusion therapy for the pyoderma which seems to also be very beneficial. 02/07/2020 on evaluation today patient appears to be doing about the same in regard to his wounds currently. Fortunately there is no signs of active infection  systemically he does still have evidence of local infection still using gentamicin. He also is showing some signs of improvement albeit slowly I do feel like we are making some progress here. 02/21/2020 upon evaluation today patient appears to be making some signs of improvement the wound is measuring a little bit smaller which is great news and overall I am very pleased with where he stands currently. He is going to be having infusion therapy treatment on the 15th of this month. Fortunately there is no signs of active infection at this time. 03/13/2020 I do believe patient's wound is actually showing some signs of improvement here which is great news. He has continue with the infusion therapy through rheumatology/dermatology at [facility]. That does seem to be beneficial. I still think he gets as much benefit from this as he did from the prednisone initially but nonetheless obviously this is less harsh on his body that the prednisone as far as they are concerned. 03/31/2020 on evaluation today patient's wound actually showing signs of some pretty good improvement in regard  to the overall appearance of the wound bed. There is still muscle exposed though he does have some epithelial growth around the edges of the wound. Fortunately there is no signs of active infection at this time. No fevers, chills, nausea, vomiting, or diarrhea. 04/24/2020 upon evaluation today patient appears to be doing about the same in regard to his leg ulcer. He has been tolerating the dressing changes without complication. Fortunately there is no signs of active infection at this time. No fevers, chills, nausea, vomiting, or diarrhea. With that being said he still has a lot of irritation from the bandaging around the edges of the wound. We did discuss today the possibility of a referral to plastic surgery. 05/22/2020 on evaluation today patient appears to be doing well with regard to his wounds all things considered. He has not been able  to get the Chantix apparently there is a recall nurse that I was unaware of put out by [employer] involuntarily. Nonetheless for now I am and I have to do some research into what may be the best option for him to help with quitting in regard to smoking and we discussed that today. 06/26/2020 upon evaluation today patient appears to be doing well with regard to his wound from the standpoint of infection I do not see any signs of infection at this point. With that being said unfortunately he is still continuing to have issues with muscle exposure and again he is not having a whole lot of new skin growth unfortunately. There does not appear to be any signs of active infection at this time. No fevers, chills, nausea, vomiting, or diarrhea. 07/10/2020 upon evaluation today patient appears to be doing a little bit more poorly currently compared to where he was previous. I am concerned currently about an active infection that may be getting worse especially in light of the increased size and tenderness of the wound bed. No fevers, chills, nausea, vomiting, or diarrhea. 07/24/2020 upon evaluation today patient appears to be doing poorly in regard to his leg ulcer. He has been tolerating the dressing changes without complication but unfortunately is having a lot of discomfort. 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 Subsequently I think he is going require possibly IV antibiotics to get this under control. I am very concerned about the severity of his infection and the amount of discomfort he is having. 07/31/2020 upon evaluation today patient appears to be doing about the same in regard to his leg wound. He did see Dr. Megan Salon and Dr. Megan Salon is actually going to start him on IV antibiotics. He goes for the PICC line tomorrow. With that being said there do not have that run for 2 weeks and then see how things are doing and depending on how he  is progressing they may extend that a little longer. Nonetheless I am glad this is getting ready to be in place and definitely feel it may help the patient. In the meantime is been using mainly triamcinolone to the wound bed has an anti-inflammatory. 08/07/2020 on evaluation today patient appears to be doing well with regard to his wound compared even last week. In the interim he has gotten the PICC line placed and overall this seems to be doing excellent. There does not appear to be any evidence of infection which is great news systemically although locally of course has had the infection this appears to be improving with the use of the antibiotics. 08/14/2020 upon  evaluation today patient's wound actually showing signs of excellent improvement. Overall the irritation has significantly improved the drainage is back down to more of a normal level and his pain is really pretty much nonexistent compared to what it was. Obviously I think that this is significantly improved secondary to the IV antibiotic therapy which has made all the difference in the world. Again he had a resistant form of Pseudomonas for which oral antibiotics just was not cutting it. Nonetheless I do think that still we need to consider the possibility of a surgical closure for this wound is been open so long and to be honest with muscle exposed I think this can be very hard to get this to close outside of this although definitely were still working to try to do what we can in that regard. 08/21/2020 upon evaluation today patient appears to be doing very well with regard to his wounds on the left lateral lower extremity/calf area. Fortunately there does not appear to be signs of active infection which is great news and overall very pleased with where things stand today. He is actually wrapping up his treatment with IV antibiotics tomorrow. After that we will see where things go from there. 08/28/2020 upon evaluation today patient appears  to be doing decently well with regard to his leg ulcer. There does not appear to be any signs of active infection which is great news and overall very pleased with where things stand today. No fevers, chills, nausea, vomiting, or diarrhea. 09/18/2020 upon evaluation today patient appears to be doing well with regard to his infection which I feel like is better. Unfortunately he is not doing as well with regard to the overall size of the wound which is not nearly as good at this point. I feel like that he may be having an issue here with the pyoderma being somewhat out of control. I think that he may benefit from potentially going back and talking to the dermatologist about SOHAIL, CAPRARO ([id number]) what to do from the pyoderma standpoint. I am not certain if the infusions are helping nearly as much is what the prednisone did in the past. 10/02/2020 upon evaluation today patient appears to be doing well with regard to his leg ulcer. He did go to the [facility] nurse. Unfortunately they feel like there is a 10% chance that most that he would be able to heal and that the skin graft would take. Obviously this has led him to not be able to go down that path as far as treatment is concerned. Nonetheless he does seem to be doing a little bit better with the prednisone that I gave him last time. I think that he may need to discuss with dermatology the possibility of long-term prednisone as that seems to be what is most helpful for him to be perfectly honest. I am not sure the Remicade is really doing the job. 10/17/2020 upon evaluation today patient appears to be doing a little better in regard to his wound. In fact the case has been since we did the prednisone on May 2 for him that we have noticed a little bit of improvement each time we have seen a size wise as well as appearance wise as well as pain wise. I think the prednisone has had a greater effect then the infusion therapy has to be perfectly honest. With  that being said the patient also feels significantly better compared to what he was previous. All of this is good news but  nonetheless I am still concerned about the fact that again we are really not set up to long-term manage him as far as prednisone is concerned. Obviously there are things that you need to be watched I completely understand the risk of prednisone usage as well. 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 With all that being said I do believe that we can give him another round of the prednisone which she is requesting today because of the improvement that he seen since we did that first round. 10/30/2020 upon evaluation today patient's wound actually is showing signs of doing quite well. There does not appear to be any evidence of infection which is great news and overall very pleased with where things stand today. No fevers, chills, nausea, vomiting, or diarrhea. He tells me that the prednisone still has seem to have helped he wonders if we can extend that for just a little bit longer. He did not have the appointment with a dermatologist although he did have an infusion appointment last Friday. That was at [facility]. With that being said he tells me he could not do both that as well as the appointment with the physician on the same day therefore that is can have to be rescheduled. I really want to see if there is anything they feel like that could be done differently to try to help this out as I am not really certain that the infusions are helping significantly here. 11/13/2020 upon evaluation today patient unfortunately appears to be doing somewhat poorly in regard to his wound I feel like this is actually worsening from the standpoint of the pyoderma spreading. I still feel like that he may need something different as far as trying to manage this going forward. Again we did the prednisone unfortunately his blood sugars are not doing so well  because of this. Nonetheless I believe that the patient likely needs to try topical steroid. We have done triamcinolone for a while I think going with something stronger such as clobetasol could be beneficial again this is not something I do lightly I discussed this with the patient that again this does not normally put underneath an occlusive dressing. Nonetheless I think a thin film as such could help with some of the stronger anti-inflammatory effects. We discussed this today. He would like to try to give this a trial for the next couple weeks. I definitely think that is something that we can do. Evaluate7/03/2021 and today patient's wound bed actually showed signs of doing really about the same. There was a little expansion of the size of the wound and that leading edge that we done looking out although the clobetasol does seem to have slowed this down a bit in my opinion. There is just 1 small area that still seems to be progressing based on what I see. Nonetheless I am concerned about the fact this does not seem to be improving if anything seems to be doing a little bit worse. I do not know that the infusions are really helping him much as next infusion is August 5 his appointment with dermatology is July 25. Either way I really think that we need to have a conversation potentially about this and I am actually going to see if I can talk with Dr. Lillia Carmel in order to see where things stand as well. 12/11/2020 upon evaluation today patient appears to be doing worse in regard to his leg ulcer. Unfortunately  I just do not think this is making the progress that I would like to see at this point. Honestly he does have an appointment with dermatology and this is in 2 days. I am wondering what they may have to offer to help with this. Right now what I am seeing is that he is continuing to show signs of worsening little by little. Obviously that is not great at all. Is the exact opposite of what we are  looking for. 12/18/2020 upon evaluation today patient appears to be doing a little better in regard to his wound. The dermatologist actually did do some steroid injections into the wound which does seem to have been beneficial in my opinion. That was on the 25th already this looks a little better to me than last time I saw him. With that being said we did do a culture and this did show that he has Staph aureus noted in abundance in the wound. With that being said I do think that getting him on an oral antibiotic would be appropriate as well. Also think we can compression wrap and this will make a difference as well. 12/28/2020 upon evaluation today patient's wound is actually showing signs of doing much better. I do believe the compression wrap is helping he has a lot of drainage but to be honest I think that the compression is helping to some degree in this regard as well as not draining through which is also good news. No fevers, chills, nausea, vomiting, or diarrhea. 01/04/2021 upon evaluation today patient appears to be doing well with regard to his wound. Overall things seem to be doing quite well. He did have a little bit of reaction to the CarboFlex Sorbact he will be using that any longer. With that being said he is controlled as far as the drainage is concerned overall and seems to be doing quite well. I do not see any signs of active infection at this time which is great news. No fevers, chills, nausea, vomiting, or diarrhea. 01/11/2021 upon evaluation today patient appears to be doing well with regard to his wounds. He has been tolerating the dressing changes without complication. Fortunately there does not appear to be any signs of active infection at this time which is great news. Overall I am extremely pleased with where we stand currently. No fevers, chills, nausea, vomiting, or diarrhea. Where using clobetasol in the wound bed he has a lot of new skin growth which is awesome as  well. 01/18/2021 upon evaluation today patient appears to be doing very well in regard to his leg ulcer. He has been tolerating the dressing changes without complication. Fortunately there does not appear to be any signs of active infection which is great news. In general I think that he is making excellent progress 01/25/2021 upon evaluation today patient appears to be doing well with regard to his wound on the leg. I am actually extremely pleased with where things stand today. There does not appear to be any signs of active infection which is great news and overall I think that we are definitely headed in the appropriate direction based on what I am seeing currently. There does not appear to be any signs of active infection also excellent news. 02/06/2021 upon evaluation today patient appears to be doing well with regard to his wound. Overall visually this is showing signs of significant improvement which is great news. I do not see any signs of active infection systemically which is great even locally  I do not think that we are seeing any major complications here. We did do fluorescence imaging with the [employer] today. The patient does have some odor and drainage noted and again this is something that I think would benefit him to probably come more frequently for nurse visits. 02/19/2021 upon evaluation today patient actually appears to be doing quite well in regard to his wound. He has been tolerating the dressing Spray, Seymour ([id number]) changes without complication and overall I think that this is making excellent progress. I do not see any evidence of active infection at this point which is great news as well. No fevers, chills, nausea, vomiting, or diarrhea. 10/10; wound is made nice progress healthy granulation with a nice rim of epithelialization which seems to be expanding even from last week he has a deeper area in the inferior part of the more distal part of the wound with not quite as  healthy as surface. This area will need to be followed. Using clobetasol and Hydrofera Blue 03/05/2021 upon evaluation today patient appears to be doing very well in regard to his leg ulcer. He has been tolerating dressing changes without complication. Fortunately there does not appear to be any signs of active infection which is great news and overall I am extremely pleased with where we stand currently. 03/12/2021 upon evaluation today patient appears to be doing well with regard to his wound in fact this is extremely extremely good based on what we are seeing today there does not appear to be any signs of active infection and overall I think that he is doing awesome from the standpoint of healing in general. I am extremely pleased with how things seem to be progressing with regard to this pyoderma. Clobetasol has done wonders for him. I think the compression wrapping has also been of great benefit. 03/19/2021 upon evaluation today patient appears to be doing well with regard to his wound. He is tolerating the dressing changes without complication. In fact I feel like that he is actually making excellent progress at this point based on what I am seeing. No fevers, chills, nausea, vomiting, or diarrhea. 03/26/2021 upon evaluation today patient appears to be doing well with regard to his wound. This again is measuring smaller and looking better. Again the progress is slow but nonetheless continual with what we have been seeing. I do believe that the current plan is doing awesome for him. Electronic Signature(s) Signed: 03/26/2021 9:09:01 AM By: Worthy Keeler PA-C Entered By: Worthy Keeler on 03/26/2021 09:09:00 SAYVON, ARTERBERRY ([id number]) -------------------------------------------------------------------------------- Physical Exam Details Patient Name: [name] Date of Service: 03/26/2021 8:15 AM Medical Record Number: [id number] Patient Account Number: [id number] Date of Birth/Sex:  Sep 06, 1980 (40 y.o. M) Treating RN: [name] Primary Care Provider: [name] Other Clinician: Referring Provider: [name] Treating Provider/Extender: [name] Cliche in Treatment: 87 Constitutional Well-nourished and well-hydrated in no acute distress. Respiratory normal breathing without difficulty. Psychiatric this patient is able to make decisions and demonstrates good insight into disease process. Alert and Oriented x 3. pleasant and cooperative. Notes Upon inspection patient's wound bed actually showed signs of good granulation epithelization at this point. Fortunately there does not appear to be any evidence of active infection systemically which is great news and overall I am extremely pleased with where we stand today. I think that the clobetasol has been doing a great job along with the compression wrap currently. Electronic Signature(s) Signed: 03/26/2021 9:09:24 AM By: Worthy Keeler PA-C Entered By:  Worthy Keeler on 03/26/2021 09:09:23 DRAYTON, TIEU ([id number]) -------------------------------------------------------------------------------- Physician Orders Details Patient Name: ZOE, NORDIN Date of Service: 03/26/2021 8:15 AM Medical Record Number: [id number] Patient Account Number: [id number] Date of Birth/Sex: 1981-03-30 (40 y.o. 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 o Nurse Visit as needed - twice a week Bathing/ Shower/ Hygiene o Clean wound with Normal Saline or wound cleanser. o Other: - Apply dakins soaked gauze on wound after cleansing wound IN OFFICE ONLY Edema Control - Lymphedema / Segmental Compressive Device / Other o Optional: One layer of unna paste to top of compression wrap (to act as an anchor). o Elevate, Exercise Daily and Avoid Standing for Long Periods of Time. o Elevate legs to the level of the heart and pump ankles as often as possible o Elevate leg(s) parallel to the floor when sitting. Wound Treatment Wound #1 - Lower Leg Wound Laterality: Left, Lateral Cleanser: Wound Cleanser 3 x Per Week/30 Days Discharge Instructions: Wash your hands with soap and water. Remove old dressing, discard into plastic bag and place into trash. Cleanse the wound with Wound Cleanser prior to applying a clean dressing using gauze sponges, not tissues or cotton balls. Do not scrub or use excessive force. Pat dry using gauze sponges, not tissue or cotton balls. Peri-Wound Care: Moisturizing Lotion 3 x Per Week/30 Days Discharge Instructions: Suggestions: Theraderm, Eucerin, Cetaphil, or patient preference. Topical: Clobetasol Propionate ointment 0.05%, 60 (g) tube 3 x Per Week/30 Days Discharge Instructions: Apply to 7 o'clock depth Primary Dressing: Hydrofera Blue Ready Transfer Foam, 4x5 (in/in) 3 x Per Week/30 Days Discharge Instructions: Apply Hydrofera Blue Ready to wound bed as directed Secondary Dressing: Xtrasorb Medium 4x5 (in/in) 3 x Per Week/30 Days Discharge Instructions: Apply to wound as directed. Do not cut. Compression Wrap: Medichoice 4 layer Compression System, 35-40 mmHG (Generic) 3 x Per Week/30 Days Discharge Instructions: Apply multi-layer wrap as directed. Electronic Signature(s) Signed: 03/26/2021 4:18:05 PM By: Worthy Keeler PA-C Signed: 03/26/2021 5:31:02 PM By:  [name], [name], RN, [name], [name], [name] Entered By: [name], [name], RN, [name], [name] on 03/26/2021 08:47:34 NIEVES, BARBERI ([id number]) -------------------------------------------------------------------------------- Problem List Details Patient Name: [name] Date of Service: 03/26/2021 8:15 AM Medical Record Number: [id number] Patient Account Number: [id number] Date of Birth/Sex: 1980-05-26 (40 y.o. M) Treating RN: [name] Primary Care Provider: [name] Other Clinician: Referring Provider: [name] Treating Provider/Extender: [name] Cliche in Treatment: 224 Active Problems ICD-10 Encounter Code Description Active Date MDM Diagnosis L97.222 Non-pressure chronic ulcer of left calf with fat layer exposed 12/04/2016 No Yes L88 Pyoderma gangrenosum 03/26/2017 No Yes L97.321 Non-pressure chronic ulcer of left ankle limited to breakdown of skin 10/08/2019 No Yes I87.2 Venous insufficiency (chronic) (peripheral) 12/04/2016 No Yes L03.116 Cellulitis of left lower limb 05/24/2019 No Yes F17.208 Nicotine dependence, unspecified, with other nicotine-induced disorders 04/24/2020 No Yes Inactive Problems ICD-10 Code Description Active Date Inactive Date L97.213 Non-pressure chronic ulcer of right calf with necrosis of muscle 04/02/2017 04/02/2017 Resolved Problems Electronic Signature(s) Signed: 03/26/2021 8:14:23 AM  By: Worthy Keeler PA-C Entered By: Worthy Keeler on 03/26/2021 08:14:23 [name] ([id number]) -------------------------------------------------------------------------------- Progress Note Details Patient Name: [name] Date of Service: 03/26/2021 8:15 AM Medical Record Number: [id number] Patient Account Number: [id number] Date of Birth/Sex: [birth year] (40 y.o. M) Treating RN: [name] Primary Care Provider: [name] Other Clinician: Referring Provider: [name] Treating Provider/Extender: [name] Cliche in Treatment: 51 Subjective Chief  Complaint Information obtained from Patient He is here in follow up evaluation for LLE pyoderma ulcer History of Present Illness (HPI) 12/04/16; 40 year old man who comes into the clinic today for review of a wound on the posterior left calf. He tells me that is been there for about a year. He is not a diabetic he does smoke half a pack per day. He was seen in the ER on 11/20/16 felt to have cellulitis around the wound and was given clindamycin. An x-ray did not show osteomyelitis. The patient initially tells me that he has a milk allergy that sets off a pruritic itching rash on his lower legs which she scratches incessantly and he thinks that's what may have set up the wound. He has been using various topical antibiotics and ointments without any effect. He works in a [employer] and is on his feet all day. He does not have a prior history of wounds however he does have the rash on both lower legs the right arm and the ventral aspect of his left arm. These are excoriations and clearly have had scratching however there are of macular looking areas on both legs including a substantial larger area on the right leg. This does not have an underlying open area. There is no blistering. The patient tells me that 2 years ago in [place] in response to the rash on his legs he saw a dermatologist who told him he had a condition which may be pyoderma gangrenosum although I may be putting words into his mouth. He seemed to recognize this. On further questioning he admits to a 5 year history of quiesced. ulcerative colitis. He is not in any treatment for this. He's had no recent travel 12/11/16; the patient arrives today with his wound and roughly the same condition we've been using silver alginate this is a deep punched out wound with some surrounding erythema but no tenderness. Biopsy I did did not show confirmed pyoderma gangrenosum suggested nonspecific inflammation and vasculitis but does not provide an actual  description of what was seen by the pathologist. I'm really not able to understand this We have also received information from the patient's dermatologist in [place] notes from April 2016. This was a doctor Agarwal-antal. 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 He was prescribed topical steroid high potency under occlusion which helped but at this point the patient did not have a deep punched out wound. 12/18/16; the patient's wound is larger in terms of surface area however this surface looks better and there is less depth. The surrounding erythema also is better. The patient states that the wrap we put on came off 2 days ago when he has been using his compression stockings. He we are in the process of getting a dermatology consult. 12/26/16 on evaluation today patient's left lower extremity wound shows evidence of infection with surrounding erythema noted. He has been tolerating the dressing changes but states that he has noted more discomfort. There is a larger area of erythema surrounding the wound. No fevers, chills, nausea, or vomiting noted at this time.  With that being said the wound still does have slough covering the surface. He is not allergic to any medication that he is aware of at this point. In regard to his right lower extremity he had several regions that are erythematous and pruritic he wonders if there's anything we can do to help that. 01/02/17 I reviewed patient's wound culture which was obtained his visit last week. He was placed on doxycycline at that point. Unfortunately that does not appear to be an antibiotic that would likely help with the situation however the pseudomonas noted on culture is sensitive to Cipro. Also unfortunately patient's wound seems to have a large compared to last week's evaluation. Not severely so but there are definitely increased measurements in general. He is continuing to have discomfort as well he writes this to be a seven out of 10. In  fact he would prefer me not to perform any debridement today due to the fact that he is having discomfort and considering he has an active infection on the little reluctant to do so anyway. No fevers, chills, nausea, or vomiting noted at this time. 01/08/17; patient seems dermatology on September 5. I suspect dermatology will want the slides from the biopsy I did sent to their pathologist. I'm not sure if there is a way we can expedite that. In any case the culture I did before I left on vacation 3 weeks ago showed Pseudomonas he was given 10 days of Cipro and per her description of her intake nurses is actually somewhat better this week although the wound is quite a bit bigger than I remember the last time I saw this. He still has 3 more days of Cipro 01/21/17; dermatology appointment tomorrow. He has completed the ciprofloxacin for Pseudomonas. Surface of the wound looks better however he is had some deterioration in the lesions on his right leg. Meantime the left lateral leg wound we will continue with sample 01/29/17; patient had his dermatology appointment but I can't yet see that note. He is completed his antibiotics. The wound is more superficial but considerably larger in circumferential area than when he came in. This is in his left lateral calf. He also has swollen erythematous areas with superficial wounds on the right leg and small papular areas on both arms. There apparently areas in her his upper thighs and buttocks I did not look at those. Dermatology biopsied the right leg. Hopefully will have their input next week. 02/05/17; patient went back to see his dermatologist who told him that he had a "scratching problem" as well as staph. He is now on a 30 day course of doxycycline and I believe she gave him triamcinolone cream to the right leg areas to help with the itching [not exactly sure but probably triamcinolone]. She apparently looked at the left lateral leg wound although this was not  rebiopsied and I think felt to be ultimately part of the same pathogenesis. He is using sample border foam and changing nevus himself. He now has a new open area on the right posterior leg which was his biopsy site I don't have any of the dermatology notes 02/12/17; we put the patient in compression last week with SANTYL to the wound on the left leg and the biopsy. Edema is much better and the depth of the wound is now at level of skin. Area is still the same Biopsy site on the right lateral leg we've also been using santyl with a border foam dressing and he is changing  this himself. 02/19/17; Using silver alginate started last week to both the substantial left leg wound and the biopsy site on the right wound. He is tolerating compression well. Has a an appointment with his primary M.D. tomorrow wondering about diuretics although I'm wondering if the edema problem is actually lymphedema GLEB, MCGUIRE ([id number]) 02/26/17; the patient has been to see his primary doctor Dr. Jerrel Ivory at [facility] our primary care. She started him on Lasix 20 mg and this seems to have helped with the edema. However we are not making substantial change with the left lateral calf wound and inflammation. The biopsy site on the right leg also looks stable but not really all that different. 03/12/17; the patient has been to see vein and vascular Dr. Lucky Cowboy. He has had venous reflux studies I have not reviewed these. I did get a call from his dermatology office. 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 They therefore recommended clobetasol and tetracycline i.e. aggressive treatment for possible pyoderma gangrenosum. 03/26/17; apparently the patient just had  reflux studies not an appointment with Dr. dew. She arrives in clinic today having applied clobetasol for 2-3 weeks. He notes over the last 2-3 days excessive drainage having to change the dressing 3-4 times a day and also expanding erythema. 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 Lillia Carmel at [facility] dermatology locally who kindly saw him at my request. 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 My concern was the degree of erythema medially extending from his left leg wound which was either inflammation from pyoderma or cellulitis. I put him on Augmentin however culture of the wound showed Pseudomonas which is quinolone sensitive. I really don't believe he has cellulitis however in view of everything I will continue and give him a course of Cipro. He is also on doxycycline as an immune modulator for the pyoderma. In addition to his original wound on the left lateral leg with surrounding erythema he has a wound on the right posterior calf which was an original biopsy site done by dermatology. 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 Saw Dr. Lillia Carmel yesterday. He has been using topical antibiotics to both wound areas his original wound on the left and the biopsies/pathergy area on the right. There is definitely some improvement in the inflammation around the wound on the right although the patient states he has increasing sensitivity of the wounds. He is on prednisone 60 and doxycycline 1 as prescribed by Dr. Lillia Carmel. He is covering the topical antibiotic with gauze and putting this in his own compression stocks and changing this  daily. He states that Dr. Lottie Rater did a culture of the left leg wound yesterday 05/07/17; pyoderma gangrenosum. The patient saw Dr. Lillia Carmel yesterday and has a follow-up with her in one month. He is still using topical antibiotics to both wounds although he can't recall exactly what type. He is still  on prednisone 60 mg. Dr. Lillia Carmel stated that the doxycycline could stop if we were in agreement. 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 The right medial leg was induced by biopsy/pathergy. The area on the right is essentially healed. 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 The right medial leg has closed and remains closed. He is still on prednisone 60. He tells me he missed his last dermatology appointment with Dr. Lillia Carmel but will make another appointment. He reports that her blood sugar at a recent screen in [place] was high 200's. He was 180 today. 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 I think he is going to require still much longer prednisone perhaps another 3 months before attempting to taper. In the meantime his wound is a lot better. Smaller. He is cleaning this off daily and applying topical antibiotics. When he was last in the clinic I thought about changing to [facility] and actually put in a couple of calls to dermatology although probably not during their business hours. 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 He continues on oral prednisone. He has been using triple antibiotic ointment. There is surface debris and we will transition to [facility] and have him return in 2 weeks. He has lost 30 pounds since his last appointment with lifestyle  modification. He may benefit from topical steroid cream for treatment this can be considered at a later date. 08/22/17 on evaluation today patient appears to actually be doing rather well in regard to his left lateral lower extremity ulcer. He has actually been managed by Dr. Dellia Nims most recently. Patient is currently on oral steroids at this time. This seems to have been of benefit for him. Nonetheless his last visit was actually with Leah on 08/06/17. Currently he is not utilizing any topical steroid creams although this could be of benefit as well. No fevers, chills, nausea, or vomiting noted at this time. 09/05/17 on evaluation today patient appears to be doing better in regard to his left lateral lower extremity ulcer. He has been tolerating the dressing changes without complication. He is using Santyl with good effect. Overall I'm very pleased with how things are standing at this point. Patient likewise is happy that this is doing better. 09/19/17 on evaluation today patient actually appears to be doing rather well in regard to his left lateral lower extremity ulcer. 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 He's not having any significant pain. 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 He has been tolerating the Santyl without complication and in general I feel like he's making good progress. 10/17/17 on evaluation today patient appears to be doing very well in regard to his left lateral lower surety ulcer. He has been tolerating the dressing changes without complication. There does not appear to be any evidence of infection he's alternating the Santyl and the triple antibiotic ointment every other day this seems to be doing well for him. 11/03/17 on evaluation today patient appears to be doing very  well in regard to  his left lateral lower extremity ulcer. He is been tolerating the dressing changes without complication which is good news. Fortunately there does not appear to be any evidence of infection which is also great news. Overall is doing excellent they are starting to taper down on the prednisone is down to 40 mg at this point it also started topical clobetasol for him. 11/17/17 on evaluation today patient appears to be doing well in regard to his left lateral lower surety ulcer. He's been tolerating the dressing changes without complication. He does note that he is having no pain, no excessive drainage or discharge, and overall he feels like things are going about how he would expect and hope they would. Overall he seems to have no evidence of infection at this time in my opinion which is good news. 12/04/17-He is seen in follow-up evaluation for right lateral lower extremity ulcer. He has been applying topical steroid cream. Today's measurement show slight increase in size. Over the next 2 weeks we will transition to every other day Santyl and steroid cream. He has been encouraged to monitor for changes and notify clinic with any concerns 12/15/17 on evaluation today patient's left lateral motion the ulcer and fortunately is doing worse again at this point. This just since last week to this week has close to doubled in size according to the patient. I did not seeing last week's I do not have a visual to compare this to in our system was also down so we do not have all the charts and at this point. Nonetheless it does have me somewhat concerned in regard to the fact that again he was worried enough about it he has contact the dermatology that placed them back on the full strength, 50 mg a day of the prednisone that he was taken previous. He continues to alternate using clobetasol along with Santyl at this point. He is obviously somewhat frustrated. DARE, SANGER ([id number]) 12/22/17 on evaluation today  patient appears to be doing a little worse compared to last evaluation. Unfortunately the wound is a little deeper and slightly larger than the last week's evaluation. With that being said he has made some progress in regard to the irritation surrounding at this time unfortunately despite that progress that's been made he still has a significant issue going on here. 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 For that reason I discussed with him today the possibility of perform a wound culture to ensure there's nothing overtly infected. 01/06/18 on evaluation today patient's wound is larger and deeper than previously evaluated. With that being said it did appear that his wound was infected after my last evaluation with him. Subsequently I did end up prescribing a prescription for Bactrim DS which she has been taking and having no complication with. Fortunately there does not appear to be any evidence of infection at this point in time as far as anything spreading, no want to touch, and overall I feel like things are showing signs of improvement. 01/13/18 on evaluation today patient appears to be even a little larger and deeper than last time. There still muscle exposed in the base of the wound. Nonetheless he does appear to be less erythematous I do believe inflammation is calming down also believe the infection looks like it's probably resolved at this time based on what I'm  seeing. No fevers, chills, nausea, or vomiting noted at this time. 01/30/18 on evaluation today patient actually appears to visually look better for the most part. Unfortunately those visually this looks better he does seem to potentially have what may be an abscess in the muscle that has been noted in the central portion of the wound. This is the first time that I have noted what  appears to be fluctuance in the central portion of the muscle. With that being said I'm somewhat more concerned about the fact that this might indicate an abscess formation at this location. I do believe that an ultrasound would be appropriate. This is likely something we need to try to do as soon as possible. He has been switch to mupirocin ointment and he is no longer using the steroid ointment as prescribed by dermatology he sees them again next week he's been decreased from 60 to 40 mg of prednisone. 03/09/18 on evaluation today patient actually appears to be doing a little better compared to last time I saw him. There's not as much erythema surrounding the wound itself. He I did review his most recent infectious disease note which was dated 02/24/18. He saw Dr. Michel Bickers in [place]. 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 Patient is now off of antibiotics and they are continually observing this. There seems to be no change in the past two weeks in my pinion based on what the patient says and what I see today compared to what Dr. Megan Salon likely saw two weeks ago. No fevers, chills, nausea, or vomiting noted at this time. 03/23/18 on evaluation today patient's wound actually appears to be showing signs of improvement which is good news. He is currently still on the Dapsone. He is also working on tapering the prednisone to get off of this and Dr. Lottie Rater is working with him in this regard. Nonetheless overall I feel like the wound is doing well it does appear based on the infectious disease note that I reviewed from Dr. 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 I did also review the results of his ultrasound of left lower extremity which revealed there was a dentist tissue in the base of  the wound without an abscess noted. 04/06/18 on evaluation today the patient's left lateral lower extremity ulcer actually appears to be doing fairly well which is excellent news. There does not appear to be any evidence of infection at this time which is also great news. Overall he still does have a significantly large ulceration although little by little he seems to be making progress. He is down to 10 mg a day of the prednisone. 04/20/18 on evaluation today patient actually appears to be doing excellent at this time in regard to his left lower extremity ulcer. He's making signs of good progress unfortunately this is taking much longer than we would really like to see but nonetheless he is making progress. Fortunately there does not appear to be any evidence of infection at this time. No fevers, chills, nausea, or vomiting noted at this time. The patient has not been using the Santyl due to the cost he hadn't got in this field yet. He's mainly been using the antibiotic ointment topically. Subsequently he also tells me that he really has not  been scrubbing in the shower I think this would be helpful again as I told him it doesn't have to be anything too aggressive to even make it believe just enough to keep it free of some of the loose slough and biofilm on the wound surface. 05/11/18 on evaluation today patient's wound appears to be making slow but sure progress in regard to the left lateral lower extremity ulcer. He is been tolerating the dressing changes without complication. Fortunately there does not appear to be any evidence of infection at this time. He is still just using triple antibiotic ointment along with clobetasol occasionally over the area. He never got the Santyl and really does not seem to intend to in my pinion. 06/01/18 on evaluation today patient appears to be doing a little better in regard to his left lateral lower extremity ulcer. He states that overall he does not feel like he  is doing as well with the Dapsone as he did with the prednisone. Nonetheless he sees his dermatologist later today and is gonna talk to them about the possibility of going back on the prednisone. Overall again I believe that the wound would be better if you would utilize Santyl but he really does not seem to be interested in going back to the [place] at this point. He has been using triple antibiotic ointment. 06/15/18 on evaluation today patient's wound actually appears to be doing about the same at this point. Fortunately there is no signs of infection at this time. He has made slight improvements although he continues to not really want to clean the wound bed at this point. He states that he just doesn't mess with it he doesn't want to cause any problems with everything else he has going on. He has been on medication, antibiotics as prescribed by his dermatologist, for a staff infection of his lower extremities which is really drying out now and looking much better he tells me. Fortunately there is no sign of overall infection. 06/29/18 on evaluation today patient appears to be doing well in regard to his left lateral lower surety ulcer all things considering. Fortunately his staff infection seems to be greatly improved compared to previous. He has no signs of infection and this is drying up quite nicely. He is still the doxycycline for this is no longer on cental, Dapsone, or any of the other medications. His dermatologist has recommended possibility of an infusion but right now he does not want to proceed with that. 07/13/18 on evaluation today patient appears to be doing about the same in regard to his left lateral lower surety ulcer. Fortunately there's no signs of infection at this time which is great news. Unfortunately he still builds up a significant amount of Slough/biofilm of the surface of the wound he still is not really cleaning this as he should be appropriately. Again I'm able to easily  with saline and gauze remove the majority of this on the surface which if you would do this at home would likely be a dramatic improvement for him as far as getting the area to improve. Nonetheless overall I still feel like he is making progress is just very slow. I think Santyl will be of benefit for him as well. Still he has not gotten this as of this point. 07/27/18 on evaluation today patient actually appears to be doing little worse in regards of the erythema around the periwound region of the wound he also tells me that he's been having more drainage currently  compared to what he was experiencing last time I saw him. He states not quite as bad as what he had because this was infected previously but nonetheless is still appears to be doing poorly. Fortunately there is no evidence of systemic infection at this point. The patient tells me that he is not going to be able to afford the Santyl. He is still waiting to hear about the infusion therapy with his dermatologist. Apparently she wants an updated colonoscopy first. ZAMERE, PASTERNAK ([id number]) 08/10/18 on evaluation today patient appears to be doing better in regard to his left lateral lower extremity ulcer. Fortunately he is showing signs of improvement in this regard he's actually been approved for Remicade infusion's as well although this has not been scheduled as of yet. Fortunately there's no signs of active infection at this time in regard to the wound although he is having some issues with infection of the right lower extremity is been seen as dermatologist for this. Fortunately they are definitely still working with him trying to keep things under control. 09/07/18 on evaluation today patient is actually doing rather well in regard to his left lateral lower extremity ulcer. He notes these actually having some hair grow back on his extremity which is something he has not seen in years. He also tells me that the pain is really not giving them  any trouble at this time which is also good news overall she is very pleased with the progress he's using a combination of the mupirocin along with the probate is all mixed. 09/21/18 on evaluation today patient actually appears to be doing fairly well all things considered in regard to his looks from the ulcer. He's been tolerating the dressing changes without complication. Fortunately there's no signs of active infection at this time which is good news he is still on all antibiotics or prevention of the staff infection. He has been on prednisone for time although he states it is gonna contact his dermatologist and see if she put them on a short course due to some irritation that he has going on currently. Fortunately there's no evidence of any overall worsening this is going very slow I think cental would be something that would be helpful for him although he states that $50 for tube is quite expensive. He therefore is not willing to get that at this point. 10/06/18 on evaluation today patient actually appears to be doing decently well in regard to his left lateral leg ulcer. He's been tolerating the dressing changes without complication. Fortunately there's no signs of active infection at this time. Overall I'm actually rather pleased with the progress he's making although it's slow he doesn't show any signs of infection and he does seem to be making some improvement. I do believe that he may need a switch up and dressings to try to help this to heal more appropriately and quickly. 10/19/18 on evaluation today patient actually appears to be doing better in regard to his left lateral lower extremity ulcer. This is shown signs of having much less Slough buildup at this point due to the fact he has been using the [employer]. Obviously this is very good news. The overall size of the wound is not dramatically smaller but again the appearance is. 11/02/18 on evaluation today patient actually appears to be doing  quite well in regard to his lower [place] ulcer. A lot of the skin around the ulcer is actually somewhat irritating at this point this seems to be more due  to the dressing causing irritation from the adhesive that anything else. Fortunately there is no signs of active infection at this time. 11/24/18 on evaluation today patient appears to be doing a little worse in regard to his overall appearance of his lower extremity ulcer. There's more erythema and warmth around the wound unfortunately. He is currently on doxycycline which he has been on for some time. With that being said I'm not sure that seems to be helping with what appears to possibly be an acute cellulitis with regard to his left lower extremity ulcer. No fevers, chills, nausea, or vomiting noted at this time. 12/08/18 on evaluation today patient's wounds actually appears to be doing significantly better compared to his last evaluation. He has been using Santyl along with alternating tripling about appointment as well as the steroid cream seems to be doing quite well and the wound is showing signs of improvement which is excellent news. Fortunately there's no evidence of infection and in fact his culture came back negative with only normal skin flora noted. 12/21/2018 upon evaluation today patient actually appears to be doing excellent with regard to his ulcer. This is actually the best that I have seen it since have been helping to take care of him. It is both smaller as well as less slough noted on the surface of the wound and seems to be showing signs of good improvement with new skin growing from the edges. He has been using just the triamcinolone he does wonder if he can get a refill of that ointment today. 01/04/2019 upon evaluation today patient actually appears to be doing well with regard to his left lateral lower extremity ulcer. With that being said it does not appear to be that he is doing quite as well as last time as far as  progression is concerned. There does not appear to be any signs of infection or significant irritation which is good news. With that being said I do believe that he may benefit from switching to a collagen based dressing based on how clean The wound appears. 01/18/2019 on evaluation today patient actually appears to be doing well with regard to his wound on the left lower extremity. He is not made a lot of progress compared to where we were previous but nonetheless does seem to be doing okay at this time which is good news. There is no signs of active infection which is also good news. My only concern currently is I do wish we can get him into utilizing the collagen dressing his insurance would not pay for the supplies that we ordered although it appears that he may be able to order this through his supply company that he typically utilizes. This is [employer]. Nonetheless he did try to order it during the office visit today and it appears this did go through. We will see if he can get that it is a different brand but nonetheless he has collagen and I do think will be beneficial. 02/01/2019 on evaluation today patient actually appears to be doing a little worse today in regard to the overall size of his wounds. Fortunately there is no signs of active infection at this time. That is visually. Nonetheless when this is happened before it was due to infection. For that reason were somewhat concerned about that this time as well. 02/08/2019 on evaluation today patient unfortunately appears to be doing slightly worse with regard to his wound upon evaluation today. Is measuring a little deeper and a little larger  unfortunately. I am not really sure exactly what is causing this to enlarge he actually did see his dermatologist she is going to see about initiating Humira for him. Subsequently she also did do steroid injections into the wound itself in the periphery. Nonetheless still nonetheless he seems to be  getting a little bit larger he is gone back to just using the steroid cream topically which I think is appropriate. I would say hold off on the collagen for the time being is definitely a good thing to do. Based on the culture results which we finally did get the final result back regarding it shows staph as the bacteria noted again that can be a normal skin bacteria based on the fact however he is having increased drainage and worsening of the wound measurement wise I would go ahead and place him on an antibiotic today I do believe for this. 02/15/2019 on evaluation today patient actually appears to be doing somewhat better in regard to his ulcer. There is no signs of worsening at this time I did review his culture results which showed evidence of Staphylococcus aureus but not MRSA. Again this could just be more related to the normal skin bacteria although he states the drainage has slowed down quite a bit he may have had a mild infection not just colonization. And was much smaller and then since around10/04/2019 on evaluation today patient appears to be doing unfortunately worse as far as the size of the wound. I really feel like that this is steadily getting larger again it had been doing excellent right at the beginning of September we have seen a steady increase in the area of the wound it is almost 2-1/2 times the size it was on September 1. Obviously this is a bad trend this is not wanting to see. For that reason we went back to using just the topical triamcinolone cream which does seem to help with inflammation. I checked him for bacteria by way of culture and nothing showed positive there. I am considering giving him a short course of a tapering steroid Jermy Couper, Shamon ([id number]) today to see if that is can be beneficial for him. The patient is in agreement with giving that a try. 03/08/2019 on evaluation today patient appears to be doing very well in comparison to last evaluation with  regard to his lower extremity ulcer. This is showing signs of less inflammation and actually measuring slightly smaller compared to last time every other week over the past month and a half he has been measuring larger larger larger. Nonetheless I do believe that the issue has been inflammation the prednisone does seem to have been beneficial for him which is good news. No fevers, chills, nausea, vomiting, or diarrhea. 03/22/2019 on evaluation today patient appears to be doing about the same with regard to his leg ulcer. He has been tolerating the dressing changes without complication. With that being said the wound seems to be mostly arrested at its current size but really is not making any progress except for when we prescribed the prednisone. He did show some signs of dropping as far as the overall size of the wound during that interval week. Nonetheless this is something he is not on long-term at this point and unfortunately I think he is getting need either this or else the Humira which his dermatologist has discussed try to get approval for. With that being said he will be seeing his dermatologist on the 11th of this month that  is November. 04/19/2019 on evaluation today patient appears to be doing really about the same the wound is measuring slightly larger compared to last time I saw him. He has not been into the office since November 2 due to the fact that he unfortunately had Covid as that his entire family. He tells me that it was rough but they did pull-through and he seems to be doing much better. Fortunately there is no signs of active infection at this time. No fevers, chills, nausea, vomiting, or diarrhea. 05/10/2019 on evaluation today patient unfortunately appears to be doing significantly worse as compared to last time I saw him. He does tell me that he has had his first dose of Humira and actually is scheduled to get the next one in the upcoming week. With that being said he tells  me also that in the past several days he has been having a lot of issues with green drainage she showed me a picture this is more blue-green in color. He is also been having issues with increased sloughy buildup and the wound does appear to be larger today. Obviously this is not the direction that we want everything to take based on the starting of his Humira. Nonetheless I think this is definitely a result of likely infection and to be honest I think this is probably Pseudomonas causing the infection based on what I am seeing. 05/24/2019 on evaluation today patient unfortunately appears to be doing significantly worse compared to his prior evaluation with me 2 weeks ago. I did review his culture results which showed that he does have Staph aureus as well as Pseudomonas noted on the culture. Nonetheless the Levaquin that I prescribed for him does not appear to have been appropriate and in fact he tells me he is no longer experiencing the green drainage and discharge that he had at the last visit. Fortunately there is no signs of active infection at this time which is good news although the wound has significantly worsened it in fact is much deeper than it was previous. We have been utilizing up to this point triamcinolone ointment as the prescription topical of choice but at this time I really feel like that the wound is getting need to be packed in order to appropriately manage this due to the deeper nature of the wound. Therefore something along the lines of an alginate dressing may be more appropriate. 05/31/2019 upon inspection today patient's wound actually showed signs of doing poorly at this point. Unfortunately he just does not seem to be making any good progress despite what we have tried. He actually did go ahead and pick up the Cipro and start taking that as he was noticing more green drainage he had previously completed the Levaquin that I prescribed for him as well. Nonetheless he missed his  appointment for the seventh last week on Wednesday with the wound care center and [facility] where his dermatologist referred him. Obviously I do think a second opinion would be helpful at this point especially in light of the fact that the patient seems to be doing so poorly despite the fact that we have tried everything that I really know how at this point. The only thing that ever seems to have helped him in the past is when he was on high doses of continual steroids that did seem to make a difference for him. Right now he is on immune modulating medication to try to help with the pyoderma but I am  not sure that he is getting as much relief at this point as he is previously obtained from the use of steroids. 06/07/2019 upon evaluation today patient unfortunately appears to be doing worse yet again with regard to his wound. In fact I am starting to question whether or not he may have a fluid pocket in the muscle at this point based on the bulging and the soft appearance to the central portion of the muscle area. There is not anything draining from the muscle itself at this time which is good news but nonetheless the wound is expanding. I am not really seeing any results of the Humira as far as overall wound progression based on what I am seeing at this point. The patient has been referred for second opinion with regard to his wound to the [facility] wound care center by his dermatologist which I definitely am not in opposition to. Unfortunately we tried multiple dressings in the past including collagen, alginate, and at one point even Hydrofera Blue. With that being said he is never really used it for any significant amount of time due to the fact that he often complains of pain associated with these dressings and then will go back to either using the Santyl which she has done intermittently or more frequently the triamcinolone. He is also using his own compression stockings. We have wrapped him in the  past but again that was something else that he really was not a big fan of. 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 He seemed to do much better when he was on the steroids although honestly I understand that the steroids are not necessarily the best medication to be on long-term obviously 06/14/2019 on evaluation today patient appears to be doing actually a little bit better with regard to the overall appearance with his leg. Unfortunately he does continue to have issues with what appears to be some fluid underneath the muscle although he did see the wound specialty center at [facility] last week their main goals were to see about infusion therapy in place of the Humira as they feel like that is not quite strong enough. They also recommended that we continue with the treatment otherwise as we are they felt like that was appropriate and they are okay with him continuing to follow-up here with [place] in that regard. With that being said they are also sending him to the vein specialist there to see about vein stripping and if that would be of benefit for him. Subsequently they also did not really address whether or not an ultrasound of the muscle area to see if there is anything that needs to be addressed here would be appropriate or not. For that reason I discussed this with him last week I think we may proceed down that road at this point. 06/21/2019 upon evaluation today patient's wound actually appears to be doing slightly better compared to previous evaluations. I do believe that he has made a difference with regard to the progression here with the use of oral steroids. Again in the past has  been the only thing that is really calm things down. He does  tell me that from [facility] is gotten a good news from there that there are no further vein stripping that is necessary at this point. I do not have that available for review today although the patient did relay this to me. He also did obtain and have the ultrasound of the wound completed which I did sign off on today. It does appear that there is no fluid collection under the muscle this is likely then just edematous tissue in general. That is also good news. Overall I still believe the inflammation is the main issue here. He did inquire about the possibility of a wound VAC again with the muscle protruding like it is I am not really sure whether the wound VAC is necessarily ideal or not. That is something we will have to consider although I do believe he may need compression wrapping to try to help with edema control which could potentially be of benefit. 06/28/2019 on evaluation today patient appears to be doing slightly better measurement wise although this is not terribly smaller he least seems to be trending towards that direction. With that being said he still seems to have purulent drainage noted in the wound bed at this time. He has been on Levaquin followed by Cipro over the past month. Unfortunately he still seems to have some issues with active infection at this time. I did perform a culture last week in order to evaluate and see if indeed there was still anything going on. Subsequently the culture did come back Hardigree, Corleone ([id number]) showing Pseudomonas which is consistent with the drainage has been having which is blue-green in color. He also has had an odor that again was somewhat consistent with Pseudomonas as well. Long story short it appears that the culture showed an intermediate finding with regard to how well the Cipro will work for the Pseudomonas infection. Subsequently being that he does not seem to be clearing up and at  best what we are doing is just keeping this at [place] I think he may need to see infectious disease to discuss IV antibiotic options. 07/05/2019 upon evaluation today patient appears to be doing okay in regard to his leg ulcer. He has been tolerating the dressing changes at this point without complication. Fortunately there is no signs of active infection at this time which is good news. No fevers, chills, nausea, vomiting, or diarrhea. With that being said he does have an appointment with infectious disease tomorrow and his primary care on Wednesday. Again the reason for the infectious disease referral was due to the fact that he did not seem to be fully resolving with the use of oral antibiotics and therefore we were thinking that IV antibiotic therapy may be necessary secondary to the fact that there was an intermediate finding for how effective the Cipro may be. Nonetheless again he has been having a lot of purulent and even green drainage. Fortunately right now that seems to have calmed down over the past week with the reinitiation of the oral antibiotic. Nonetheless we will see what Dr. Megan Salon has to say. 07/12/2019 upon evaluation today patient appears to be doing about the same at this point in regard to his left lower extremity ulcer. Fortunately there is no signs of active infection at this time which is good news I do believe the Levaquin has been beneficial I did review Dr. Hale Bogus note and to be honest I agree that the patient's leg does appear to be doing better currently. What we found  in the past as he does not seem to really completely resolve he will stop the antibiotic and then subsequently things will revert back to having issues with blue-green drainage, increased pain, and overall worsening in general. Obviously that is the reason I sent him back to infectious disease. 07/19/2019 upon evaluation today patient appears to be doing roughly the same in size there is really no dramatic  improvement. He has started back on the Levaquin at this point and though he seems to be doing okay he did still have a lot of blue/green drainage noted on evaluation today unfortunately. I think that this is still indicative more likely of a Pseudomonas infection as previously noted and again he does see Dr. Megan Salon in just a couple of days. I do not know that were really able to effectively clear this with just oral antibiotics alone based on what I am seeing currently. Nonetheless we are still continue to try to manage as best we can with regard to the patient and his wound. I do think the wrap was helpful in decreasing the edema which is excellent news. No fevers, chills, nausea, vomiting, or diarrhea. 07/26/2019 upon evaluation today patient appears to be doing slightly better with regard to the overall appearance of the muscle there is no dark discoloration centrally. Fortunately there is no signs of active infection at this time. No fevers, chills, nausea, vomiting, or diarrhea. Patient's wound bed currently the patient did have an appointment with Dr. Megan Salon at infectious disease last week. With that being said Dr. Megan Salon the patient states was still somewhat hesitant about put him on any IV antibiotics he wanted [place] to repeat cultures today and then see where things go going forward. He does look like Dr. Megan Salon because of some improvement the patient did have with the Levaquin wanted [place] to see about repeating cultures. If it indeed grows the Pseudomonas again then he recommended a possibility of considering a PICC line placement and IV antibiotic therapy. He plans to see the patient back in 1 to 2 weeks. 08/02/2019 upon evaluation today patient appears to be doing poorly with regard to his left lower extremity. We did get the results of his culture back it shows that he is still showing evidence of Pseudomonas which is consistent with the purulent/blue-green drainage that he has  currently. Subsequently the culture also shows that he now is showing resistance to the oral fluoroquinolones which is unfortunate as that was really the only thing to treat the infection prior. I do believe that he is looking like this is going require IV antibiotic therapy to get this under control. Fortunately there is no signs of systemic infection at this time which is good news. The patient does see Dr. Megan Salon tomorrow. 08/09/2019 upon evaluation today patient appears to be doing better with regard to his left lower extremity ulcer in regard to the overall appearance. He is currently on IV antibiotic therapy. As ordered by Dr. Megan Salon. Currently the patient is on ceftazidime which she is going to take for the next 2 weeks and then follow-up for 4 to 5-week appointment with Dr. Megan Salon. The patient started this this past Friday symptoms have not for a total of 3 days currently in full. 08/16/2019 upon evaluation today patient's wound actually does show muscle in the base of the wound but in general does appear to be much better as far as the overall evidence of infection is concerned. In fact I feel like this is for the  most part cleared up he still on the IV antibiotics he has not completed the full course yet but I think he is doing much better which is excellent news. 08/23/2019 upon evaluation today patient appears to be doing about the same with regard to his wound at this point. He tells me that he still has pain unfortunately. Fortunately there is no evidence of systemic infection at this time which is great news. There is significant muscle protrusion. 09/13/19 upon evaluation today patient appears to be doing about the same in regard to his leg unfortunately. He still has a lot of drainage coming from the ulceration there is still muscle exposed. With that being said the patient's last wound culture still showed an intermediate finding with regard to the Pseudomonas he still having the  bluish/green drainage as well. Overall I do not know that the wound has completely cleared of infection at this point. Fortunately there is no signs of active infection systemically at this point which is good news. 09/20/2019 upon evaluation today patient's wound actually appears to be doing about the same based on what I am seeing currently. I do not see any signs of systemic infection he still does have evidence of some local infection and drainage. He did see Dr. Megan Salon last week and Dr. Megan Salon states that he probably does need a different IV antibiotic although he does not want to put him on this until the patient begins the Remicade infusion which is actually scheduled for about 10 days out from today on 13 May. Following that time Dr. Megan Salon is good to see him back and then will evaluate the feasibility of starting him on the IV antibiotic therapy once again at that point. I do not disagree with this plan I do believe as Dr. Megan Salon stated in his note that I reviewed today that the patient's issue is multifactorial with the pyoderma being 1 aspect of this that were hoping the Remicade will be helpful for her. In the meantime I think the gentamicin is, helping to keep things under decent okay control in regard to the ulcer. 09/27/2019 upon evaluation today patient appears to be doing about the same with regard to his wound still there is a lot of muscle exposure though he does have some hyper granulation tissue noted around the edge and actually some granulation tissue starting to form over the muscle which is actually good news. Fortunately there is no evidence of active infection which is also good news. His pain is less at this point. 5/21; this is a patient I have not seen in a long time. 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 He has a large wound on the left lateral leg with protruding muscle. He comes in the clinic today showing the same area on  his left medial ankle. He says there is been a spot there for some time although we have not previously defined this. Today he has a clearly defined area with slight amount of skin breakdown surrounded by raised areas with a purplish hue in color. This is not painful he says it is irritated. 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 He still has muscle protruding from the lateral portion of his left leg but fortunately the new area that they were concerned about at his last visit does not appear to have opened at this point. He is currently on Remicade  infusions and seems to be doing better in my opinion in fact the wound itself seems to be overall much better. The purplish discoloration that he did have seems to have resolved and I think that is a good sign that hopefully the Remicade is doing its job. He does Serpe, Phinehas ([id number]) have some biofilm noted over the surface of the wound. 11/01/2019 on evaluation today patient's wound actually appears to be doing excellent at this time. Fortunately there is no evidence of active infection and overall I feel like he is making great progress. The Remicade seems to be due excellent job in my opinion. 11/08/19 evaluation today vision actually appears to be doing quite well with regard to his weight ulcer. He's been tolerating dressing changes without complication. Fortunately there is no evidence of infection. No fevers, chills, nausea, or vomiting noted at this time. Overall states that is having more itching than pain which is actually a good sign in my opinion. 12/13/2019 upon evaluation today patient appears to be doing well today with regard to his wound. He has been tolerating the dressing changes without complication. Fortunately there is no sign of active infection at this time. No fevers, chills, nausea, vomiting, or diarrhea. Overall I  feel like the infusion therapy has been very beneficial for him. 01/06/2020 on evaluation today patient appears to be doing well with regard to his wound. This is measuring smaller and actually looks to be doing better. Fortunately there is no signs of active infection at this point. No fevers, chills, nausea, vomiting, or diarrhea. With that being said he does still have the blue-green drainage but this does not seem to be causing any significant issues currently. He has been using the gentamicin that does seem to be keeping things under decent control at this point. He goes later this morning for his next infusion therapy for the pyoderma which seems to also be very beneficial. 02/07/2020 on evaluation today patient appears to be doing about the same in regard to his wounds currently. Fortunately there is no signs of active infection systemically he does still have evidence of local infection still using gentamicin. He also is showing some signs of improvement albeit slowly I do feel like we are making some progress here. 02/21/2020 upon evaluation today patient appears to be making some signs of improvement the wound is measuring a little bit smaller which is great news and overall I am very pleased with where he stands currently. He is going to be having infusion therapy treatment on the 15th of this month. Fortunately there is no signs of active infection at this time. 03/13/2020 I do believe patient's wound is actually showing some signs of improvement here which is great news. He has continue with the infusion therapy through rheumatology/dermatology at [facility]. That does seem to be beneficial. I still think he gets as much benefit from this as he did from the prednisone initially but nonetheless obviously this is less harsh on his body that the prednisone as far as they are concerned. 03/31/2020 on evaluation today patient's wound actually showing signs of some pretty good improvement in regard to  the overall appearance of the wound bed. There is still muscle exposed though he does have some epithelial growth around the edges of the wound. Fortunately there is no signs of active infection at this time. No fevers, chills, nausea, vomiting, or diarrhea. 04/24/2020 upon evaluation today patient appears to be doing about the same in regard to his leg ulcer. He  has been tolerating the dressing changes without complication. Fortunately there is no signs of active infection at this time. No fevers, chills, nausea, vomiting, or diarrhea. With that being said he still has a lot of irritation from the bandaging around the edges of the wound. We did discuss today the possibility of a referral to plastic surgery. 05/22/2020 on evaluation today patient appears to be doing well with regard to his wounds all things considered. He has not been able to get the Chantix apparently there is a recall nurse that I was unaware of put out by [employer] involuntarily. Nonetheless for now I am and I have to do some research into what may be the best option for him to help with quitting in regard to smoking and we discussed that today. 06/26/2020 upon evaluation today patient appears to be doing well with regard to his wound from the standpoint of infection I do not see any signs of infection at this point. With that being said unfortunately he is still continuing to have issues with muscle exposure and again he is not having a whole lot of new skin growth unfortunately. There does not appear to be any signs of active infection at this time. No fevers, chills, nausea, vomiting, or diarrhea. 07/10/2020 upon evaluation today patient appears to be doing a little bit more poorly currently compared to where he was previous. I am concerned currently about an active infection that may be getting worse especially in light of the increased size and tenderness of the wound bed. No fevers, chills, nausea, vomiting, or diarrhea. 07/24/2020  upon evaluation today patient appears to be doing poorly in regard to his leg ulcer. He has been tolerating the dressing changes without complication but unfortunately is having a lot of discomfort. 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 Subsequently I think he is going require possibly IV antibiotics to get this under control. I am very concerned about the severity of his infection and the amount of discomfort he is having. 07/31/2020 upon evaluation today patient appears to be doing about the same in regard to his leg wound. He did see Dr. Megan Salon and Dr. Megan Salon is actually going to start him on IV antibiotics. He goes for the PICC line tomorrow. With that being said there do not have that run for 2 weeks and then see how things are doing and depending on how he is progressing they may extend that a little longer. Nonetheless I am glad this is getting ready to be in place and definitely feel it may help the patient. In the meantime is been using mainly triamcinolone to the wound bed has an anti-inflammatory. 08/07/2020 on evaluation today patient appears to be doing well with regard to his wound compared even last week. In the interim he has gotten the PICC line placed and overall this seems to be doing excellent. There does not appear to be any evidence of infection which is great news systemically although locally of course has had the infection this appears to be improving with the use of the antibiotics. 08/14/2020 upon evaluation today patient's wound actually showing signs of excellent improvement. Overall the irritation has significantly improved the drainage is back down to more of a normal level and his pain is really pretty much nonexistent compared to what it was. Obviously I think that this is significantly improved secondary to the IV antibiotic therapy which has made all the difference in the world. Again  he had  a resistant form of Pseudomonas for which oral antibiotics just was not cutting it. Nonetheless I do think that still we need to consider the possibility of a surgical closure for this wound is been open so long and to be honest with muscle exposed I think this can be very hard to get this to close outside of this although definitely were still working to try to do what we can in that regard. 08/21/2020 upon evaluation today patient appears to be doing very well with regard to his wounds on the left lateral lower extremity/calf area. Fortunately there does not appear to be signs of active infection which is great news and overall very pleased with where things stand today. He is actually wrapping up his treatment with IV antibiotics tomorrow. After that we will see where things go from there. 08/28/2020 upon evaluation today patient appears to be doing decently well with regard to his leg ulcer. There does not appear to be any signs of Mose, Gaige ([id number]) active infection which is great news and overall very pleased with where things stand today. No fevers, chills, nausea, vomiting, or diarrhea. 09/18/2020 upon evaluation today patient appears to be doing well with regard to his infection which I feel like is better. Unfortunately he is not doing as well with regard to the overall size of the wound which is not nearly as good at this point. I feel like that he may be having an issue here with the pyoderma being somewhat out of control. I think that he may benefit from potentially going back and talking to the dermatologist about what to do from the pyoderma standpoint. I am not certain if the infusions are helping nearly as much is what the prednisone did in the past. 10/02/2020 upon evaluation today patient appears to be doing well with regard to his leg ulcer. He did go to the [facility] nurse. Unfortunately they feel like there is a 10% chance that most that he would be able to heal and that the  skin graft would take. Obviously this has led him to not be able to go down that path as far as treatment is concerned. Nonetheless he does seem to be doing a little bit better with the prednisone that I gave him last time. I think that he may need to discuss with dermatology the possibility of long-term prednisone as that seems to be what is most helpful for him to be perfectly honest. I am not sure the Remicade is really doing the job. 10/17/2020 upon evaluation today patient appears to be doing a little better in regard to his wound. In fact the case has been since we did the prednisone on May 2 for him that we have noticed a little bit of improvement each time we have seen a size wise as well as appearance wise as well as pain wise. I think the prednisone has had a greater effect then the infusion therapy has to be perfectly honest. With that being said the patient also feels significantly better compared to what he was previous. All of this is good news but nonetheless I am still concerned about the fact that again we are really not set up to long-term manage him as far as prednisone is concerned. Obviously there are things that you need to be watched I completely understand the risk of prednisone usage as well. 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 With  all that being said I do believe that we can give him another round of the prednisone which she is requesting today because of the improvement that he seen since we did that first round. 10/30/2020 upon evaluation today patient's wound actually is showing signs of doing quite well. There does not appear to be any evidence of infection which is great news and overall very pleased with where things stand today. No fevers, chills, nausea, vomiting, or diarrhea. He tells me that the prednisone still has seem to have helped he wonders if we can extend that for just a little bit longer. He did not have  the appointment with a dermatologist although he did have an infusion appointment last Friday. That was at [facility]. With that being said he tells me he could not do both that as well as the appointment with the physician on the same day therefore that is can have to be rescheduled. I really want to see if there is anything they feel like that could be done differently to try to help this out as I am not really certain that the infusions are helping significantly here. 11/13/2020 upon evaluation today patient unfortunately appears to be doing somewhat poorly in regard to his wound I feel like this is actually worsening from the standpoint of the pyoderma spreading. I still feel like that he may need something different as far as trying to manage this going forward. Again we did the prednisone unfortunately his blood sugars are not doing so well because of this. Nonetheless I believe that the patient likely needs to try topical steroid. We have done triamcinolone for a while I think going with something stronger such as clobetasol could be beneficial again this is not something I do lightly I discussed this with the patient that again this does not normally put underneath an occlusive dressing. Nonetheless I think a thin film as such could help with some of the stronger anti-inflammatory effects. We discussed this today. He would like to try to give this a trial for the next couple weeks. I definitely think that is something that we can do. Evaluate7/03/2021 and today patient's wound bed actually showed signs of doing really about the same. There was a little expansion of the size of the wound and that leading edge that we done looking out although the clobetasol does seem to have slowed this down a bit in my opinion. There is just 1 small area that still seems to be progressing based on what I see. Nonetheless I am concerned about the fact this does not seem to be improving if anything seems to be doing a  little bit worse. I do not know that the infusions are really helping him much as next infusion is August 5 his appointment with dermatology is July 25. Either way I really think that we need to have a conversation potentially about this and I am actually going to see if I can talk with Dr. Lillia Carmel in order to see where things stand as well. 12/11/2020 upon evaluation today patient appears to be doing worse in regard to his leg ulcer. Unfortunately I just do not think this is making the progress that I would like to see at this point. Honestly he does have an appointment with dermatology and this is in 2 days. I am wondering what they may have to offer to help with this. Right now what I am seeing is that he is continuing to show signs of worsening little  by little. Obviously that is not great at all. Is the exact opposite of what we are looking for. 12/18/2020 upon evaluation today patient appears to be doing a little better in regard to his wound. The dermatologist actually did do some steroid injections into the wound which does seem to have been beneficial in my opinion. That was on the 25th already this looks a little better to me than last time I saw him. With that being said we did do a culture and this did show that he has Staph aureus noted in abundance in the wound. With that being said I do think that getting him on an oral antibiotic would be appropriate as well. Also think we can compression wrap and this will make a difference as well. 12/28/2020 upon evaluation today patient's wound is actually showing signs of doing much better. I do believe the compression wrap is helping he has a lot of drainage but to be honest I think that the compression is helping to some degree in this regard as well as not draining through which is also good news. No fevers, chills, nausea, vomiting, or diarrhea. 01/04/2021 upon evaluation today patient appears to be doing well with regard to his wound. Overall  things seem to be doing quite well. He did have a little bit of reaction to the CarboFlex Sorbact he will be using that any longer. With that being said he is controlled as far as the drainage is concerned overall and seems to be doing quite well. I do not see any signs of active infection at this time which is great news. No fevers, chills, nausea, vomiting, or diarrhea. 01/11/2021 upon evaluation today patient appears to be doing well with regard to his wounds. He has been tolerating the dressing changes without complication. Fortunately there does not appear to be any signs of active infection at this time which is great news. Overall I am extremely pleased with where we stand currently. No fevers, chills, nausea, vomiting, or diarrhea. Where using clobetasol in the wound bed he has a lot of new skin growth which is awesome as well. 01/18/2021 upon evaluation today patient appears to be doing very well in regard to his leg ulcer. He has been tolerating the dressing changes without complication. Fortunately there does not appear to be any signs of active infection which is great news. In general I think that he is making excellent progress 01/25/2021 upon evaluation today patient appears to be doing well with regard to his wound on the leg. I am actually extremely pleased with where things stand today. There does not appear to be any signs of active infection which is great news and overall I think that we are definitely headed in the appropriate direction based on what I am seeing currently. There does not appear to be any signs of active infection also excellent news. 02/06/2021 upon evaluation today patient appears to be doing well with regard to his wound. Overall visually this is showing signs of significant Rochefort, Greer ([id number]) improvement which is great news. I do not see any signs of active infection systemically which is great even locally I do not think that we are seeing any major  complications here. We did do fluorescence imaging with the [employer] DX today. The patient does have some odor and drainage noted and again this is something that I think would benefit him to probably come more frequently for nurse visits. 02/19/2021 upon evaluation today patient actually appears to be doing  quite well in regard to his wound. He has been tolerating the dressing changes without complication and overall I think that this is making excellent progress. I do not see any evidence of active infection at this point which is great news as well. No fevers, chills, nausea, vomiting, or diarrhea. 10/10; wound is made nice progress healthy granulation with a nice rim of epithelialization which seems to be expanding even from last week he has a deeper area in the inferior part of the more distal part of the wound with not quite as healthy as surface. This area will need to be followed. Using clobetasol and Hydrofera Blue 03/05/2021 upon evaluation today patient appears to be doing very well in regard to his leg ulcer. He has been tolerating dressing changes without complication. Fortunately there does not appear to be any signs of active infection which is great news and overall I am extremely pleased with where we stand currently. 03/12/2021 upon evaluation today patient appears to be doing well with regard to his wound in fact this is extremely extremely good based on what we are seeing today there does not appear to be any signs of active infection and overall I think that he is doing awesome from the standpoint of healing in general. I am extremely pleased with how things seem to be progressing with regard to this pyoderma. Clobetasol has done wonders for him. I think the compression wrapping has also been of great benefit. 03/19/2021 upon evaluation today patient appears to be doing well with regard to his wound. He is tolerating the dressing changes without complication. In fact I feel  like that he is actually making excellent progress at this point based on what I am seeing. No fevers, chills, nausea, vomiting, or diarrhea. 03/26/2021 upon evaluation today patient appears to be doing well with regard to his wound. This again is measuring smaller and looking better. Again the progress is slow but nonetheless continual with what we have been seeing. I do believe that the current plan is doing awesome for him. Objective Constitutional Well-nourished and well-hydrated in no acute distress. Vitals Time Taken: 8:13 AM, Height: 71 in, Weight: 338 lbs, BMI: 47.1, Temperature: 98.4 F, Pulse: 89 bpm, Respiratory Rate: 16 breaths/min, Blood Pressure: 134/82 mmHg. Respiratory normal breathing without difficulty. Psychiatric this patient is able to make decisions and demonstrates good insight into disease process. Alert and Oriented x 3. pleasant and cooperative. General Notes: Upon inspection patient's wound bed actually showed signs of good granulation epithelization at this point. Fortunately there does not appear to be any evidence of active infection systemically which is great news and overall I am extremely pleased with where we stand today. I think that the clobetasol has been doing a great job along with the compression wrap currently. Integumentary (Hair, Skin) Wound #1 status is Open. Original cause of wound was Gradually Appeared. The date acquired was: 11/18/2015. The wound has been in treatment 224 weeks. The wound is located on the Left,Lateral Lower Leg. The wound measures 5.4cm length x 2.5cm width x 0.4cm depth; 10.603cm^2 area and 4.241cm^3 volume. There is a medium amount of serosanguineous drainage noted. 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 There was a Four Layer Compression Therapy Procedure by [name], RN. Post procedure Diagnosis Wound #1: Same as Pre-Procedure Plan Follow-up Appointments: Return Appointment in 1 week. Nurse Visit as needed - twice a week Bathing/ Shower/ Hygiene: Clean wound with Normal Saline or wound cleanser. Other: - Apply dakins soaked gauze on wound after cleansing wound IN OFFICE ONLY Edema Control - Lymphedema / Segmental Compressive Device / Other: Optional: One layer of unna paste to top of compression wrap (to act as an anchor). Elevate, Exercise Daily and Avoid Standing for Long Periods of Time. Elevate legs to the level of the heart and pump ankles as often as possible Elevate leg(s) parallel to the floor when sitting. WOUND #1: - Lower Leg Wound Laterality: Left, Lateral Cleanser: Wound Cleanser 3 x Per Week/30 Days Discharge Instructions: Wash your hands with soap and water. Remove old dressing, discard into plastic bag and place into trash. Cleanse the wound with Wound Cleanser prior to applying a clean dressing using gauze sponges, not tissues or cotton balls. Do not scrub or use excessive force. Pat dry using gauze sponges, not tissue or cotton balls. Peri-Wound Care: Moisturizing Lotion 3 x Per Week/30 Days Discharge Instructions: Suggestions: Theraderm, Eucerin, Cetaphil, or patient preference. Topical: Clobetasol Propionate ointment 0.05%, 60 (g) tube 3 x Per Week/30 Days Discharge Instructions: Apply to 7 o'clock depth Primary Dressing: Hydrofera Blue Ready Transfer Foam, 4x5 (in/in) 3 x Per Week/30 Days Discharge Instructions: Apply Hydrofera Blue Ready to wound bed as directed Secondary Dressing: Xtrasorb Medium 4x5 (in/in) 3 x Per Week/30 Days Discharge  Instructions: Apply to wound as directed. Do not cut. Compression Wrap: Medichoice 4 layer Compression System, 35-40 mmHG (Generic) 3 x Per Week/30 Days Discharge Instructions: Apply multi-layer wrap as directed. 1. Would recommend that we going continue with the wound care measures as before and the patient is in agreement with plan this includes the use of the [facility] with the clobetasol underneath. 2. I am also can recommend we continue with XtraSorb. 3. I would also suggest that we continue with the 4-layer compression wrap as well. We will see patient back for reevaluation in 1 week here in the clinic. If anything worsens or changes patient will contact our office for additional recommendations. Electronic Signature(s) Signed: 03/26/2021 9:09:49 AM By: Worthy Keeler PA-C Entered By: Worthy Keeler on 03/26/2021 09:09:49 [name] ([id number]) -------------------------------------------------------------------------------- SuperBill Details Patient Name: [name] Date of Service: 03/26/2021 Medical Record Number: [id number] Patient Account Number: [id number] Date of Birth/Sex: [birth year] (40 y.o. M) Treating RN: [name] Primary Care Provider: [name] Other Clinician: Referring Provider: [name] Treating Provider/Extender: [name] Cliche in Treatment: 224 Diagnosis Coding ICD-10 Codes Code Description [phone] Non-pressure chronic ulcer of left calf with fat layer exposed L88 Pyoderma gangrenosum L97.321 Non-pressure chronic ulcer of left ankle limited to breakdown of skin I87.2 Venous insufficiency (chronic) (peripheral) L03.116 Cellulitis of left lower limb F17.208 Nicotine dependence, unspecified, with other nicotine-induced disorders Facility Procedures CPT4 Code: 27741287 Description: (Facility Use Only) [phone] - APPLY MULTLAY COMPRS LWR LT LEG Modifier: Quantity: 1 Physician Procedures CPT4 Code: 9470962 Description: 99214 - WC  PHYS LEVEL 4 - EST PT Modifier: Quantity: 1 CPT4 Code: Description: ICD-10 Diagnosis Description L97.222 Non-pressure chronic ulcer of left calf with fat layer exposed L88 Pyoderma gangrenosum I87.2 Venous insufficiency (chronic) (peripheral) L97.321 Non-pressure chronic ulcer of left ankle limited to  breakdown of skin Modifier: Quantity: Electronic Signature(s) Signed: 03/26/2021 9:10:14 AM  By: Worthy Keeler PA-C Previous Signature: 03/26/2021 9:10:00 AM Version By: Worthy Keeler PA-C Entered By: Worthy Keeler on 03/26/2021 09:10:13

## 2021-03-27 ENCOUNTER — Encounter: Payer: 59 | Admitting: Physician Assistant

## 2021-03-27 NOTE — Progress Notes (Signed)
EVIN, LOISEAU ([id number]) Visit Report for 03/26/2021 Arrival Information Details Patient Name: NEALE, MARZETTE Date of Service: 03/26/2021 8:15 AM Medical Record Number: [id number] Patient Account Number: [id number] Date of Birth/Sex: November 07, 1980 (40 y.o. M) Treating RN: [name] Primary Care Manar Smalling: [name] Other Clinician: Referring Mirta Mally: [name] Treating Jahmarion Popoff/Extender: [name] Cliche in Treatment: 45 Visit Information History Since Last Visit Added or deleted any medications: No Patient Arrived: Ambulatory Has Dressing in Place as Prescribed: Yes Arrival Time: 08:13 Has Compression in Place as Prescribed: Yes Accompanied By: self Pain Present Now: No Transfer Assistance: None Patient Identification Verified: Yes Secondary Verification Process Completed: Yes Patient Requires Transmission-Based No Precautions: Patient Has Alerts: Yes Patient Alerts: Patient has reaction to silver dressings. Electronic Signature(s) Signed: 03/26/2021 5:31:02 PM By: [name], [name], RN, [name], [name], [name] Entered By: [name], [name], RN, [name], [name] on 03/26/2021 08:13:56 [name] ([id number]) -------------------------------------------------------------------------------- Compression Therapy Details Patient Name: [name] Date of Service: 03/26/2021 8:15 AM Medical Record Number: [id number] Patient Account Number: [id number] Date of Birth/Sex: 12-05-1980 (40 y.o. M) Treating RN: [name] Primary Care Ahjanae Cassel: [name] Other Clinician: Referring Kato Wieczorek: [name] Treating Fenris Cauble/Extender: [name] Cliche in Treatment: 224 Compression Therapy Performed for Wound Assessment: Wound #1 Left,Lateral Lower Leg Performed By: Clinician [name], RN Compression Type: Four Layer Post Procedure Diagnosis Same as Pre-procedure Electronic Signature(s) Signed: 03/26/2021 5:31:02 PM By: [name], [name], RN, [name], [name], [name] Entered By: [name], [name], RN, [name], [name] on  03/26/2021 08:46:48 [name] ([id number]) -------------------------------------------------------------------------------- Encounter Discharge Information Details Patient Name: [name] Date of Service: 03/26/2021 8:15 AM Medical Record Number: [id number] Patient Account Number: [id number] Date of Birth/Sex: [birth year] (40 y.o. M) Treating RN: [name] Primary Care Azrael Maddix: [name] Other Clinician: Referring Naftuli Dalsanto: [name] Treating Kamali Sakata/Extender: [name] Cliche in Treatment: 224 Encounter Discharge Information Items Discharge Condition: Stable Ambulatory Status: Ambulatory Discharge Destination: Home Transportation: Private Auto Schedule Follow-up Appointment: Yes Clinical Summary of Care: Electronic Signature(s) Signed: 03/26/2021 5:31:02 PM By: [name], [name], RN, [name], [name], [name] Entered By: [name], [name], RN, [name], [name] on 03/26/2021 08:48:46 [name] ([id number]) -------------------------------------------------------------------------------- Lower Extremity Assessment Details Patient Name: [name] Date of Service: 03/26/2021 8:15 AM Medical Record Number: [id number] Patient Account Number: [id number] Date of Birth/Sex: 1980-10-02 (40 y.o. M) Treating RN: [name] Primary Care Rashanda Magloire: [name] Other Clinician: Referring Regis Hinton: [name] Treating Ivory Maduro/Extender: [name] Cliche in Treatment: 224 Edema Assessment Assessed: [Left: No] [Right: No] [Left: Edema] [Right: :] Calf Left: Right: Point of Measurement: 33 cm From Medial Instep 49 cm Ankle Left: Right: Point of Measurement: 12 cm From Medial Instep 30 cm Vascular Assessment Pulses: Dorsalis Pedis Palpable: [Left:Yes] Electronic Signature(s) Signed: 03/26/2021 5:31:02 PM By: [name], [name], RN, [name], [name], [name] Entered By: [name], [name], RN, [name], [name] on 03/26/2021 08:21:45 [name]  ([id number]) -------------------------------------------------------------------------------- Multi Wound Chart Details Patient Name: [name] Date of Service: 03/26/2021 8:15 AM Medical Record Number: [id number] Patient Account Number: [id number] Date of Birth/Sex: 1980-09-01 (40 y.o. M) Treating RN: [name] Primary Care Chrystian Ressler: [name] Other Clinician: Referring Marvie Calender: [name] Treating Jesslynn Kruck/Extender: [name] Cliche in Treatment: 224 Vital Signs Height(in): 71 Pulse(bpm): 89 Weight(lbs): 338 Blood Pressure(mmHg): 134/82 Body Mass Index(BMI): 47 Temperature(F): 98.4 Respiratory Rate(breaths/min): 16 Photos: [1:No Photos] [N/A:N/A] Wound Location: [1:Left, Lateral Lower Leg] [N/A:N/A] Wounding Event: [1:Gradually Appeared] [N/A:N/A] 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] [N/A:N/A] Date Acquired: [1:11/18/2015] [N/A:N/A] Weeks of Treatment: [1:224] [N/A:N/A] Wound Status: [1:Open] [N/A:N/A] Measurements L x W x  D (cm) [1:5.4x2.5x0.4] [N/A:N/A] Area (cm) : [1:10.603] [N/A:N/A] Volume (cm) : [1:4.241] [N/A:N/A] % Reduction in Area: [1:-116.00%] [N/A:N/A] % Reduction in Volume: [1:-8.00%] [N/A:N/A] Classification: [1:Full Thickness With Exposed Support Structures] [N/A:N/A] Exudate Amount: [1:Medium] [N/A:N/A] Exudate Type: [1:Serosanguineous red, brown] [N/A:N/A N/A] Treatment Notes Electronic Signature(s) Signed: 03/26/2021 5:31:02 PM By: [name], [name], RN, [name], [name], [name] Entered By: [name], [name], RN, [name], [name] on 03/26/2021 08:35:30 [name] ([id number]) -------------------------------------------------------------------------------- Multi-Disciplinary Care Plan Details Patient Name: [name] Date of Service: 03/26/2021 8:15 AM Medical Record Number: [id number] Patient Account Number: [id number] Date of Birth/Sex: [birth year] (40 y.o. M) Treating RN: [name] Primary Care Adisyn Ruscitti: [name] Other Clinician: Referring Ione Sandusky: [name] Treating Shamond Skelton/Extender: [name] Cliche in Treatment: [place] reviewed with physician Active Inactive Necrotic Tissue Nursing Diagnoses: Impaired tissue integrity related to necrotic/devitalized tissue Knowledge deficit related to management of necrotic/devitalized tissue Goals: Necrotic/devitalized tissue will be minimized in the wound bed Date Initiated: 03/26/2021 Target Resolution Date: 03/26/2021 Goal Status: Active Patient/caregiver will verbalize understanding of reason and process for debridement of necrotic tissue Date Initiated: 03/26/2021 Target Resolution Date: 03/26/2021 Goal Status: Active Interventions: Assess patient pain level pre-, during and post procedure and prior to discharge Provide education on necrotic tissue and debridement process Treatment Activities: Apply topical anesthetic as ordered : 03/26/2021 Notes: Electronic Signature(s) Signed: 03/26/2021 5:31:02 PM By: [name], [name], RN, [name], [name], [name] Entered By: [name], [name], RN, [name], [name] on 03/26/2021 08:35:22 [name] ([id number]) -------------------------------------------------------------------------------- Pain Assessment Details Patient Name: [name] Date of Service: 03/26/2021 8:15 AM Medical Record Number: [id number] Patient Account Number: [id number] Date of Birth/Sex: 10-Jan-1981 (40 y.o. M) Treating RN: [name] Primary Care Lyndy Russman: [name] Other Clinician: Referring Ludwin Flahive: [name] Treating Laree Garron/Extender: [name] Cliche in Treatment: 224 Active Problems Location of Pain Severity and Description of Pain Patient Has Paino No Site Locations Pain Management and Medication Current Pain Management: Notes Patient denies pain at this time. Electronic Signature(s) Signed: 03/26/2021 5:31:02 PM By: [name], [name], RN, [name], [name], [name] Entered By: [name], [name], RN, [name], [name] on 03/26/2021 08:14:56 FELIX, MERAS  ([id number]) -------------------------------------------------------------------------------- Patient/Caregiver Education Details Patient Name: [name] Date of Service: 03/26/2021 8:15 AM Medical Record Number: [id number] Patient Account Number: [id number] Date of Birth/Gender: [birth year] (40 y.o. M) Treating RN: [name] Primary Care Physician: [name] Other Clinician: Referring Physician: [name] Treating Physician/Extender: [name] Cliche in Treatment: 224 Education Assessment Education Provided To: Patient Education Topics Provided Venous: Handouts: Controlling Swelling with Multilayered Compression Wraps Methods: Demonstration, Explain/Verbal Responses: State content correctly Electronic Signature(s) Signed: 03/26/2021 5:31:02 PM By: [name], [name], RN, [name], [name], [name] Entered By: [name], [name], RN, [name], [name] on 03/26/2021 08:48:08 [name] ([id number]) -------------------------------------------------------------------------------- Wound Assessment Details Patient Name: [name] Date of Service: 03/26/2021 8:15 AM Medical Record Number: [id number] Patient Account Number: [id number] Date of Birth/Sex: April 25, 1981 (40 y.o. M) Treating RN: [name] Primary Care Eian Vandervelden: [name] Other Clinician: Referring Suliman Termini: [name] Treating Zyionna Pesce/Extender: [name] Cliche in Treatment: 224 Wound Status Wound Number: 1 Primary Etiology: Pyoderma Wound Location: Left, Lateral Lower Leg Wound Status: Open Wounding Event: Gradually Appeared Date Acquired: 11/18/2015 Weeks Of Treatment: 224 Clustered Wound: No Photos Photo Uploaded By: [name], [name], RN, [name], [name] on 03/26/2021 08:35:57 Wound Measurements Length: (cm) 5.4 Width: (cm) 2.5 Depth: (cm) 0.4 Area: (cm) 10.603 Volume: (cm) 4.241 % Reduction in Area: -116% % Reduction in Volume: -8% Wound Description Classification: Full Thickness With Exposed  Support Structures Exudate  Amount: Medium Exudate Type: Serosanguineous Exudate Color: red, brown Treatment Notes Wound #1 (Lower Leg) Wound Laterality: Left, Lateral Cleanser Wound Cleanser Discharge Instruction: Wash your hands with soap and water. Remove old dressing, discard into plastic bag and place into trash. Cleanse the wound with Wound Cleanser prior to applying a clean dressing using gauze sponges, not tissues or cotton balls. Do not scrub or use excessive force. Pat dry using gauze sponges, not tissue or cotton balls. Peri-Wound Care Moisturizing Lotion Discharge Instruction: Suggestions: Theraderm, Eucerin, Cetaphil, or patient preference. Topical Clobetasol Propionate ointment 0.05%, 60 (g) tube Discharge Instruction: Apply to 7 o'clock depth Cassetta, Winford ([id number]) Primary Dressing Hydrofera Blue Ready Transfer Foam, 4x5 (in/in) Discharge Instruction: Apply Hydrofera Blue Ready to wound bed as directed Secondary Dressing Xtrasorb Medium 4x5 (in/in) Discharge Instruction: Apply to wound as directed. Do not cut. Secured With Compression Wrap Medichoice 4 layer Compression System, 35-40 mmHG Discharge Instruction: Apply multi-layer wrap as directed. Compression Stockings Environmental education officer) Signed: 03/26/2021 5:31:02 PM By: [name], [name], RN, [name], [name], [name] Entered By: [name], [name], RN, [name], [name] on 03/26/2021 08:19:33 JHONY, ANTRIM ([id number]) -------------------------------------------------------------------------------- [place] Details Patient Name: [name] Date of Service: 03/26/2021 8:15 AM Medical Record Number: [id number] Patient Account Number: [id number] Date of Birth/Sex: [birth year] (40 y.o. M) Treating RN: [name] Primary Care Elayne Gruver: [name] Other Clinician: Referring Antone Summons: [name] Treating Jadee Golebiewski/Extender: [name] Cliche in Treatment: 224 Vital Signs Time Taken: 08:13 Temperature (F): 98.4 Height (in): 71 Pulse (bpm):  89 Weight (lbs): 338 Respiratory Rate (breaths/min): 16 Body Mass Index (BMI): 47.1 Blood Pressure (mmHg): 134/82 Reference Range: 80 - 120 mg / dl Electronic Signature(s) Signed: 03/26/2021 5:31:02 PM By: [name], [name], RN, [name], [name], [name] Entered By: [name], [name], RN, [name], [name] on 03/26/2021 08:14:16

## 2021-03-28 ENCOUNTER — Other Ambulatory Visit: Payer: Self-pay

## 2021-03-28 DIAGNOSIS — E11622 Type 2 diabetes mellitus with other skin ulcer: Secondary | ICD-10-CM | POA: Diagnosis not present

## 2021-03-28 NOTE — Progress Notes (Signed)
AUDREY, ELLER ([id number]) Visit Report for 03/28/2021 Arrival Information Details Patient Name: [name], [name] Date of Service: 03/28/2021 8:00 AM Medical Record Number: [id number] Patient Account Number: [id number] Date of Birth/Sex: December 23, 1980 (40 y.o. M) Treating RN: Donnamarie Poag Primary Care Dolora Ridgely: [name] Other Clinician: Referring Samyrah Bruster: [name] Treating Carylon Tamburro/Extender: Yaakov Guthrie in Treatment: 55 Visit Information History Since Last Visit Added or deleted any medications: No Patient Arrived: Ambulatory Had a fall or experienced change in No Arrival Time: 08:05 activities of daily living that may affect Accompanied By: self risk of falls: Transfer Assistance: None Hospitalized since last visit: No Patient Identification Verified: Yes Has Dressing in Place as Prescribed: Yes Secondary Verification Process Completed: Yes Has Compression in Place as Prescribed: Yes Patient Requires Transmission-Based No Pain Present Now: No Precautions: Patient Has Alerts: Yes Patient Alerts: Patient has reaction to silver dressings. Electronic Signature(s) Signed: 03/28/2021 12:18:09 PM By: Donnamarie Poag Entered By: Donnamarie Poag on 03/28/2021 08:22:02 [name] ([id number]) -------------------------------------------------------------------------------- Clinic Level of Care Assessment Details Patient Name: [name] Date of Service: 03/28/2021 8:00 AM Medical Record Number: [id number] Patient Account Number: [id number] Date of Birth/Sex: [birth year] (40 y.o. M) Treating RN: Donnamarie Poag Primary Care Syndey Jaskolski: [name] Other Clinician: Referring Bakary Bramer: [name] Treating Jenson Beedle/Extender: Yaakov Guthrie in Treatment: 225 Clinic Level of Care Assessment Items TOOL 1 Quantity Score []  - Use when EandM and Procedure is performed on INITIAL visit 0 ASSESSMENTS - Nursing Assessment / Reassessment []  - General Physical Exam  (combine w/ comprehensive assessment (listed just below) when performed on new 0 pt. evals) []  - 0 Comprehensive Assessment (HX, ROS, Risk Assessments, Wounds Hx, etc.) ASSESSMENTS - Wound and Skin Assessment / Reassessment []  - Dermatologic / Skin Assessment (not related to wound area) 0 ASSESSMENTS - Ostomy and/or Continence Assessment and Care []  - Incontinence Assessment and Management 0 []  - 0 Ostomy Care Assessment and Management (repouching, etc.) PROCESS - Coordination of Care []  - Simple Patient / Family Education for ongoing care 0 []  - 0 Complex (extensive) Patient / Family Education for ongoing care []  - 0 Staff obtains Programmer, systems, Records, Test Results / Process Orders []  - 0 Staff telephones HHA, Nursing Homes / Clarify orders / etc []  - 0 Routine Transfer to another Facility (non-emergent condition) []  - 0 Routine Hospital Admission (non-emergent condition) []  - 0 New Admissions / Biomedical engineer / Ordering NPWT, Apligraf, etc. []  - 0 Emergency Hospital Admission (emergent condition) PROCESS - Special Needs []  - Pediatric / Minor Patient Management 0 []  - 0 Isolation Patient Management []  - 0 Hearing / Language / Visual special needs []  - 0 Assessment of Community assistance (transportation, D/C planning, etc.) []  - 0 Additional assistance / Altered mentation []  - 0 Support Surface(s) Assessment (bed, cushion, seat, etc.) INTERVENTIONS - Miscellaneous []  - External ear exam 0 []  - 0 Patient Transfer (multiple staff / Civil Service fast streamer / Similar devices) []  - 0 Simple Staple / Suture removal (25 or less) []  - 0 Complex Staple / Suture removal (26 or more) []  - 0 Hypo/Hyperglycemic Management (do not check if billed separately) []  - 0 Ankle / Brachial Index (ABI) - do not check if billed separately Has the patient been seen at the hospital within the last three years: Yes Total Score: 0 Level Of Care: ____ [name] ([id number]) Electronic  Signature(s) Signed: 03/28/2021 12:18:09 PM By: Donnamarie Poag Entered By: Donnamarie Poag on 03/28/2021 08:26:48 [name] ([id number]) -------------------------------------------------------------------------------- Compression Therapy Details Patient Name: [name]  Date of Service: 03/28/2021 8:00 AM Medical Record Number: [id number] Patient Account Number: [id number] Date of Birth/Sex: Aug 09, 1980 (40 y.o. M) Treating RN: Donnamarie Poag Primary Care Bethan Adamek: [name] Other Clinician: Referring Rual Vermeer: [name] Treating Alvar Malinoski/Extender: Yaakov Guthrie in Treatment: 225 Compression Therapy Performed for Wound Assessment: Wound #1 Left,Lateral Lower Leg Performed By: Junius Argyle, RN Compression Type: Four Layer Electronic Signature(s) Signed: 03/28/2021 12:18:09 PM By: Donnamarie Poag Entered By: Donnamarie Poag on 03/28/2021 08:23:39 [name] ([id number]) -------------------------------------------------------------------------------- Encounter Discharge Information Details Patient Name: [name] Date of Service: 03/28/2021 8:00 AM Medical Record Number: [id number] Patient Account Number: [id number] Date of Birth/Sex: February 24, 1981 (40 y.o. M) Treating RN: Donnamarie Poag Primary Care Tanequa Kretz: [name] Other Clinician: Referring Hikaru Delorenzo: [name] Treating Alan Riles/Extender: Yaakov Guthrie in Treatment: 225 Encounter Discharge Information Items Discharge Condition: Stable Ambulatory Status: Ambulatory Discharge Destination: Home Transportation: Private Auto Accompanied By: self Schedule Follow-up Appointment: Yes Clinical Summary of Care: Electronic Signature(s) Signed: 03/28/2021 12:18:09 PM By: Donnamarie Poag Entered By: Donnamarie Poag on 03/28/2021 08:25:40 [name] ([id number]) -------------------------------------------------------------------------------- Wound Assessment Details Patient Name: [name] Date of  Service: 03/28/2021 8:00 AM Medical Record Number: [id number] Patient Account Number: [id number] Date of Birth/Sex: 12-22-1980 (40 y.o. M) Treating RN: Donnamarie Poag Primary Care Amarachukwu Lakatos: [name] Other Clinician: Referring Jafeth [name]: [name] Treating Marvelle Caudill/Extender: Yaakov Guthrie in Treatment: 225 Wound Status Wound Number: 1 Primary Etiology: Pyoderma Wound Location: Left, Lateral Lower Leg Wound Status: Open Wounding Event: Gradually Appeared Comorbid History: Sleep Apnea, Hypertension, Colitis Date Acquired: 11/18/2015 Weeks Of Treatment: 225 Clustered Wound: No Wound Measurements Length: (cm) 5.4 Width: (cm) 2.5 Depth: (cm) 0.4 Area: (cm) 10.603 Volume: (cm) 4.241 % Reduction in Area: -116% % Reduction in Volume: -8% Wound Description Classification: Full Thickness With Exposed Support Structures Exudate Amount: Medium Exudate Type: Serosanguineous Exudate Color: red, brown Treatment Notes Wound #1 (Lower Leg) Wound Laterality: Left, Lateral Cleanser Wound Cleanser Discharge Instruction: Wash your hands with soap and water. Remove old dressing, discard into plastic bag and place into trash. Cleanse the wound with Wound Cleanser prior to applying a clean dressing using gauze sponges, not tissues or cotton balls. Do not scrub or use excessive force. Pat dry using gauze sponges, not tissue or cotton balls. Peri-Wound Care Moisturizing Lotion Discharge Instruction: Suggestions: Theraderm, Eucerin, Cetaphil, or patient preference. Topical Clobetasol Propionate ointment 0.05%, 60 (g) tube Discharge Instruction: Apply to 7 o'clock depth Primary Dressing Hydrofera Blue Ready Transfer Foam, 4x5 (in/in) Discharge Instruction: Apply Hydrofera Blue Ready to wound bed as directed Secondary Dressing Xtrasorb Medium 4x5 (in/in) Discharge Instruction: Apply to wound as directed. Do not cut. Secured With Compression Wrap Medichoice 4 layer Compression  System, 35-40 mmHG Discharge Instruction: Apply multi-layer wrap as directed. Compression Stockings [name], [name] ([id number]) Add-Ons Electronic Signature(s) Signed: 03/28/2021 12:18:09 PM By: Donnamarie Poag Entered ByDonnamarie Poag on 03/28/2021 08:23:16

## 2021-03-30 ENCOUNTER — Other Ambulatory Visit: Payer: Self-pay

## 2021-03-30 DIAGNOSIS — E11622 Type 2 diabetes mellitus with other skin ulcer: Secondary | ICD-10-CM | POA: Diagnosis not present

## 2021-03-30 NOTE — Progress Notes (Signed)
NYZIR, DUBOIS ([id number]) Visit Report for 03/30/2021 Arrival Information Details Patient Name: [name], [name] Date of Service: 03/30/2021 8:00 AM Medical Record Number: [id number] Patient Account Number: [id number] Date of Birth/Sex: 02-Mar-1981 (40 y.o. M) Treating RN: Donnamarie Poag Primary Care Deshanae Lindo: [name] Other Clinician: Referring Heiress Williamson: [name] Treating Carmela Piechowski/Extender: [name] Cliche in Treatment: 63 Visit Information History Since Last Visit Added or deleted any medications: No Patient Arrived: Ambulatory Had a fall or experienced change in No Arrival Time: 08:04 activities of daily living that may affect Accompanied By: self risk of falls: Transfer Assistance: None Hospitalized since last visit: No Patient Identification Verified: Yes Has Dressing in Place as Prescribed: Yes Secondary Verification Process Completed: Yes Has Compression in Place as Prescribed: Yes Patient Requires Transmission-Based No Pain Present Now: No Precautions: Patient Has Alerts: Yes Patient Alerts: Patient has reaction to silver dressings. Electronic Signature(s) Signed: 03/30/2021 10:54:14 AM By: Donnamarie Poag Entered By: Donnamarie Poag on 03/30/2021 08:24:05 [name] ([id number]) -------------------------------------------------------------------------------- Clinic Level of Care Assessment Details Patient Name: [name] Date of Service: 03/30/2021 8:00 AM Medical Record Number: [id number] Patient Account Number: [id number] Date of Birth/Sex: 12/15/1980 (40 y.o. M) Treating RN: Donnamarie Poag Primary Care Aqua Denslow: [name] Other Clinician: Referring Kosisochukwu Goldberg: [name] Treating Jamiee Milholland/Extender: [name] Cliche in Treatment: 225 Clinic Level of Care Assessment Items TOOL 1 Quantity Score []  - Use when EandM and Procedure is performed on INITIAL visit 0 ASSESSMENTS - Nursing Assessment / Reassessment []  - General Physical Exam (combine  w/ comprehensive assessment (listed just below) when performed on new 0 pt. evals) []  - 0 Comprehensive Assessment (HX, ROS, Risk Assessments, Wounds Hx, etc.) ASSESSMENTS - Wound and Skin Assessment / Reassessment []  - Dermatologic / Skin Assessment (not related to wound area) 0 ASSESSMENTS - Ostomy and/or Continence Assessment and Care []  - Incontinence Assessment and Management 0 []  - 0 Ostomy Care Assessment and Management (repouching, etc.) PROCESS - Coordination of Care []  - Simple Patient / Family Education for ongoing care 0 []  - 0 Complex (extensive) Patient / Family Education for ongoing care []  - 0 Staff obtains Programmer, systems, Records, Test Results / Process Orders []  - 0 Staff telephones HHA, Nursing Homes / Clarify orders / etc []  - 0 Routine Transfer to another Facility (non-emergent condition) []  - 0 Routine Hospital Admission (non-emergent condition) []  - 0 New Admissions / Biomedical engineer / Ordering NPWT, Apligraf, etc. []  - 0 Emergency Hospital Admission (emergent condition) PROCESS - Special Needs []  - Pediatric / Minor Patient Management 0 []  - 0 Isolation Patient Management []  - 0 Hearing / Language / Visual special needs []  - 0 Assessment of Community assistance (transportation, D/C planning, etc.) []  - 0 Additional assistance / Altered mentation []  - 0 Support Surface(s) Assessment (bed, cushion, seat, etc.) INTERVENTIONS - Miscellaneous []  - External ear exam 0 []  - 0 Patient Transfer (multiple staff / Civil Service fast streamer / Similar devices) []  - 0 Simple Staple / Suture removal (25 or less) []  - 0 Complex Staple / Suture removal (26 or more) []  - 0 Hypo/Hyperglycemic Management (do not check if billed separately) []  - 0 Ankle / Brachial Index (ABI) - do not check if billed separately Has the patient been seen at the hospital within the last three years: Yes Total Score: 0 Level Of Care: ____ [name] ([id number]) Electronic  Signature(s) Signed: 03/30/2021 10:54:14 AM By: Donnamarie Poag Entered By: Donnamarie Poag on 03/30/2021 08:26:51 Poer, [name] ([id number]) -------------------------------------------------------------------------------- Compression Therapy Details Patient Name: [name]  Date of Service: 03/30/2021 8:00 AM Medical Record Number: [id number] Patient Account Number: [id number] Date of Birth/Sex: [birth year] (40 y.o. M) Treating RN: Donnamarie Poag Primary Care Braulio Kiedrowski: [name] Other Clinician: Referring Shuna Tabor: [name] Treating Kendra Woolford/Extender: [name] Cliche in Treatment: 225 Compression Therapy Performed for Wound Assessment: Wound #1 Left,Lateral Lower Leg Performed By: Junius Argyle, RN Compression Type: Four Layer Electronic Signature(s) Signed: 03/30/2021 10:54:14 AM By: Donnamarie Poag Entered By: Donnamarie Poag on 03/30/2021 08:26:04 [name] ([id number]) -------------------------------------------------------------------------------- Encounter Discharge Information Details Patient Name: [name] Date of Service: 03/30/2021 8:00 AM Medical Record Number: [id number] Patient Account Number: [id number] Date of Birth/Sex: 26-Jan-1981 (40 y.o. M) Treating RN: Donnamarie Poag Primary Care Benjaman Artman: [name] Other Clinician: Referring Gerrit Rafalski: [name] Treating Thorn Demas/Extender: [name] Cliche in Treatment: 225 Encounter Discharge Information Items Discharge Condition: Stable Ambulatory Status: Ambulatory Discharge Destination: Home Transportation: Private Auto Accompanied By: self Schedule Follow-up Appointment: Yes Clinical Summary of Care: Electronic Signature(s) Signed: 03/30/2021 10:54:14 AM By: Donnamarie Poag Entered By: Donnamarie Poag on 03/30/2021 08:26:44 [name] ([id number]) -------------------------------------------------------------------------------- Wound Assessment Details Patient Name: [name] Date of Service:  03/30/2021 8:00 AM Medical Record Number: [id number] Patient Account Number: [id number] Date of Birth/Sex: 10-Aug-1980 (40 y.o. M) Treating RN: Donnamarie Poag Primary Care Cordel Drewes: [name] Other Clinician: Referring Kayson Bullis: [name] Treating Zaiyah Sottile/Extender: [name] Cliche in Treatment: 225 Wound Status Wound Number: 1 Primary Etiology: Pyoderma Wound Location: Left, Lateral Lower Leg Wound Status: Open Wounding Event: Gradually Appeared Comorbid History: Sleep Apnea, Hypertension, Colitis Date Acquired: 11/18/2015 Weeks Of Treatment: 225 Clustered Wound: No Wound Measurements Length: (cm) 5.4 Width: (cm) 2.5 Depth: (cm) 0.4 Area: (cm) 10.603 Volume: (cm) 4.241 % Reduction in Area: -116% % Reduction in Volume: -8% Wound Description Classification: Full Thickness With Exposed Support Structures Exudate Amount: Medium Exudate Type: Serosanguineous Exudate Color: red, brown Treatment Notes Wound #1 (Lower Leg) Wound Laterality: Left, Lateral Cleanser Wound Cleanser Discharge Instruction: Wash your hands with soap and water. Remove old dressing, discard into plastic bag and place into trash. Cleanse the wound with Wound Cleanser prior to applying a clean dressing using gauze sponges, not tissues or cotton balls. Do not scrub or use excessive force. Pat dry using gauze sponges, not tissue or cotton balls. Peri-Wound Care Moisturizing Lotion Discharge Instruction: Suggestions: Theraderm, Eucerin, Cetaphil, or patient preference. Topical Clobetasol Propionate ointment 0.05%, 60 (g) tube Discharge Instruction: Apply to 7 o'clock depth Primary Dressing Hydrofera Blue Ready Transfer Foam, 4x5 (in/in) Discharge Instruction: Apply Hydrofera Blue Ready to wound bed as directed Secondary Dressing Xtrasorb Medium 4x5 (in/in) Discharge Instruction: Apply to wound as directed. Do not cut. Secured With Compression Wrap Medichoice 4 layer Compression System, 35-40  mmHG Discharge Instruction: Apply multi-layer wrap as directed. Compression Stockings JAFAR, POFFENBERGER ([id number]) Add-Ons Electronic Signature(s) Signed: 03/30/2021 10:54:14 AM By: Donnamarie Poag Entered ByDonnamarie Poag on 03/30/2021 08:24:33

## 2021-04-02 ENCOUNTER — Ambulatory Visit: Payer: 59 | Admitting: Physician Assistant

## 2021-04-03 ENCOUNTER — Encounter: Payer: 59 | Admitting: Physician Assistant

## 2021-04-04 ENCOUNTER — Other Ambulatory Visit: Payer: Self-pay

## 2021-04-04 DIAGNOSIS — E11622 Type 2 diabetes mellitus with other skin ulcer: Secondary | ICD-10-CM | POA: Diagnosis not present

## 2021-04-04 NOTE — Progress Notes (Signed)
[name], [name] ([id number]) Visit Report for 04/04/2021 Arrival Information Details Patient Name: [name], [name] Date of Service: 04/04/2021 8:00 AM Medical Record Number: [id number] Patient Account Number: [id number] Date of Birth/Sex: April 06, 1981 (40 y.o. M) Treating RN: Donnamarie Poag Primary Care Deretha Ertle: [name] Other Clinician: Referring Walther Sanagustin: [name] Treating Colyn Miron/Extender: Yaakov Guthrie in Treatment: 64 Visit Information History Since Last Visit Added or deleted any medications: No Patient Arrived: Ambulatory Had a fall or experienced change in No Arrival Time: 08:03 activities of daily living that may affect Accompanied By: self risk of falls: Transfer Assistance: None Hospitalized since last visit: No Patient Identification Verified: Yes Has Dressing in Place as Prescribed: Yes Secondary Verification Process Completed: Yes Has Compression in Place as Prescribed: Yes Patient Requires Transmission-Based No Pain Present Now: No Precautions: Patient Has Alerts: Yes Patient Alerts: Patient has reaction to silver dressings. Electronic Signature(s) Signed: 04/04/2021 12:00:28 PM By: Donnamarie Poag Entered By: Donnamarie Poag on 04/04/2021 08:18:58 [name] ([id number]) -------------------------------------------------------------------------------- Clinic Level of Care Assessment Details Patient Name: [name] Date of Service: 04/04/2021 8:00 AM Medical Record Number: [id number] Patient Account Number: [id number] Date of Birth/Sex: Jan 29, 1981 (40 y.o. M) Treating RN: Donnamarie Poag Primary Care Rula Keniston: [name] Other Clinician: Referring Russia Scheiderer: [name] Treating Ceili Boshers/Extender: Yaakov Guthrie in Treatment: 226 Clinic Level of Care Assessment Items TOOL 1 Quantity Score []  - Use when EandM and Procedure is performed on INITIAL visit 0 ASSESSMENTS - Nursing Assessment / Reassessment []  - General Physical Exam  (combine w/ comprehensive assessment (listed just below) when performed on new 0 pt. evals) []  - 0 Comprehensive Assessment (HX, ROS, Risk Assessments, Wounds Hx, etc.) ASSESSMENTS - Wound and Skin Assessment / Reassessment []  - Dermatologic / Skin Assessment (not related to wound area) 0 ASSESSMENTS - Ostomy and/or Continence Assessment and Care []  - Incontinence Assessment and Management 0 []  - 0 Ostomy Care Assessment and Management (repouching, etc.) PROCESS - Coordination of Care []  - Simple Patient / Family Education for ongoing care 0 []  - 0 Complex (extensive) Patient / Family Education for ongoing care []  - 0 Staff obtains Programmer, systems, Records, Test Results / Process Orders []  - 0 Staff telephones HHA, Nursing Homes / Clarify orders / etc []  - 0 Routine Transfer to another Facility (non-emergent condition) []  - 0 Routine Hospital Admission (non-emergent condition) []  - 0 New Admissions / Biomedical engineer / Ordering NPWT, Apligraf, etc. []  - 0 Emergency Hospital Admission (emergent condition) PROCESS - Special Needs []  - Pediatric / Minor Patient Management 0 []  - 0 Isolation Patient Management []  - 0 Hearing / Language / Visual special needs []  - 0 Assessment of Community assistance (transportation, D/C planning, etc.) []  - 0 Additional assistance / Altered mentation []  - 0 Support Surface(s) Assessment (bed, cushion, seat, etc.) INTERVENTIONS - Miscellaneous []  - External ear exam 0 []  - 0 Patient Transfer (multiple staff / Civil Service fast streamer / Similar devices) []  - 0 Simple Staple / Suture removal (25 or less) []  - 0 Complex Staple / Suture removal (26 or more) []  - 0 Hypo/Hyperglycemic Management (do not check if billed separately) []  - 0 Ankle / Brachial Index (ABI) - do not check if billed separately Has the patient been seen at the hospital within the last three years: Yes Total Score: 0 Level Of Care: ____ [name] ([id number]) Electronic  Signature(s) Signed: 04/04/2021 12:00:28 PM By: Donnamarie Poag Entered By: Donnamarie Poag on 04/04/2021 08:24:33 [name] ([id number]) -------------------------------------------------------------------------------- Compression Therapy Details Patient Name: [name]  Date of Service: 04/04/2021 8:00 AM Medical Record Number: [id number] Patient Account Number: [id number] Date of Birth/Sex: Oct 30, 1980 (40 y.o. M) Treating RN: Donnamarie Poag Primary Care Primus Gritton: [name] Other Clinician: Referring Rai Severns: [name] Treating Dhwani Venkatesh/Extender: Yaakov Guthrie in Treatment: 226 Compression Therapy Performed for Wound Assessment: Wound #1 Left,Lateral Lower Leg Performed By: Junius Argyle, RN Compression Type: Four Layer Electronic Signature(s) Signed: 04/04/2021 12:00:28 PM By: Donnamarie Poag Entered By: Donnamarie Poag on 04/04/2021 08:19:54 [name] ([id number]) -------------------------------------------------------------------------------- Encounter Discharge Information Details Patient Name: [name] Date of Service: 04/04/2021 8:00 AM Medical Record Number: [id number] Patient Account Number: [id number] Date of Birth/Sex: Aug 26, 1980 (40 y.o. M) Treating RN: Donnamarie Poag Primary Care Seanpaul Preece: [name] Other Clinician: Referring Shamell Suarez: [name] Treating Preslynn Bier/Extender: Yaakov Guthrie in Treatment: 226 Encounter Discharge Information Items Discharge Condition: Stable Ambulatory Status: Ambulatory Discharge Destination: Home Transportation: Private Auto Accompanied By: self Schedule Follow-up Appointment: Yes Clinical Summary of Care: Electronic Signature(s) Signed: 04/04/2021 12:00:28 PM By: Donnamarie Poag Entered By: Donnamarie Poag on 04/04/2021 08:21:12 [name] ([id number]) -------------------------------------------------------------------------------- Wound Assessment Details Patient Name: [name] Date of  Service: 04/04/2021 8:00 AM Medical Record Number: [id number] Patient Account Number: [id number] Date of Birth/Sex: August 10, 1980 (40 y.o. M) Treating RN: Donnamarie Poag Primary Care Yaxiel Minnie: [name] Other Clinician: Referring Everton Bertha: [name] Treating Jahmai Finelli/Extender: Yaakov Guthrie in Treatment: 226 Wound Status Wound Number: 1 Primary Etiology: Pyoderma Wound Location: Left, Lateral Lower Leg Wound Status: Open Wounding Event: Gradually Appeared Comorbid History: Sleep Apnea, Hypertension, Colitis Date Acquired: 11/18/2015 Weeks Of Treatment: 226 Clustered Wound: No Wound Measurements Length: (cm) 5.4 Width: (cm) 2.5 Depth: (cm) 0.4 Area: (cm) 10.603 Volume: (cm) 4.241 % Reduction in Area: -116% % Reduction in Volume: -8% Wound Description Classification: Full Thickness With Exposed Support Structures Exudate Amount: Medium Exudate Type: Serosanguineous Exudate Color: red, brown Wound Bed Granulation Amount: Large (67-100%) Granulation Quality: Red, Pink Necrotic Amount: Small (1-33%) Necrotic Quality: Adherent Slough Treatment Notes Wound #1 (Lower Leg) Wound Laterality: Left, Lateral Cleanser Wound Cleanser Discharge Instruction: Wash your hands with soap and water. Remove old dressing, discard into plastic bag and place into trash. Cleanse the wound with Wound Cleanser prior to applying a clean dressing using gauze sponges, not tissues or cotton balls. Do not scrub or use excessive force. Pat dry using gauze sponges, not tissue or cotton balls. Peri-Wound Care Moisturizing Lotion Discharge Instruction: Suggestions: Theraderm, Eucerin, Cetaphil, or patient preference. Topical Clobetasol Propionate ointment 0.05%, 60 (g) tube Discharge Instruction: Apply to 7 o'clock depth Primary Dressing Hydrofera Blue Ready Transfer Foam, 4x5 (in/in) Discharge Instruction: Apply Hydrofera Blue Ready to wound bed as directed Secondary Dressing Xtrasorb  Medium 4x5 (in/in) Discharge Instruction: Apply to wound as directed. Do not cut. Secured With McGuffey ([id number]) Compression Wrap Medichoice 4 layer Compression System, 35-40 mmHG Discharge Instruction: Apply multi-layer wrap as directed. Compression Stockings Add-Ons Electronic Signature(s) Signed: 04/04/2021 12:00:28 PM By: Donnamarie Poag Entered ByDonnamarie Poag on 04/04/2021 08:19:34

## 2021-04-06 ENCOUNTER — Other Ambulatory Visit: Payer: Self-pay

## 2021-04-06 DIAGNOSIS — E11622 Type 2 diabetes mellitus with other skin ulcer: Secondary | ICD-10-CM | POA: Diagnosis not present

## 2021-04-06 NOTE — Unmapped (Signed)
I discussed this patient's case including the history, clinical findings and assessment and plan with the resident physician. I reviewed the resident's note. I agree with the plan as documented in the resident's note. I was immediately available.     Wandalene Abrams V Keelan Pomerleau, MD

## 2021-04-09 ENCOUNTER — Other Ambulatory Visit: Payer: Self-pay

## 2021-04-09 ENCOUNTER — Encounter: Payer: 59 | Admitting: Physician Assistant

## 2021-04-09 ENCOUNTER — Ambulatory Visit: Admit: 2021-04-09 | Discharge: 2021-04-10 | Payer: PRIVATE HEALTH INSURANCE

## 2021-04-09 DIAGNOSIS — L720 Epidermal cyst: Principal | ICD-10-CM

## 2021-04-09 DIAGNOSIS — D492 Neoplasm of unspecified behavior of bone, soft tissue, and skin: Principal | ICD-10-CM

## 2021-04-09 DIAGNOSIS — E11622 Type 2 diabetes mellitus with other skin ulcer: Secondary | ICD-10-CM | POA: Diagnosis not present

## 2021-04-09 NOTE — Unmapped (Signed)
We appreciated the opportunity to see you in clinic today! Your resident doctor was Louanne Skye, MD and your attending doctor was Louanne Skye, MD.    Managing your wound after skin surgery     Please avoid strenuous activity for 48 hours and all heavy exercise for 1 week  If your wound is on your arm, leg, or shoulder, then avoid heavy lifting, straining, or exercise with that limb for at least one week.  If your wound is on the head or neck, avoid bending over if at all possible.  Sleep with your head elevated for 48 hours to reduce the risk of bleeding.  Please don???t smoke for 3 weeks; smoking prevents healthy wound healing    Pain management: Can take 600 mg of acetaminophen (Tylenol ) every 6-8 hours, or medication we prescribed as needed. Pain should decrease steadily for the 1st few days after your surgery.     Possible complications:  Bleeding: a small amount of blood on the bandage is normal.   If there is a significant bleeding into the bandage or beneath the stitches (this will look like swelling and purple discoloration), apply firm pressure  with a cloth or bandage for 20 minutes without interruption. Repeat if bleeding continues. If it persists, please call [phone]. During nonoffice hours use the message of this number to contact the on-call dermatologist or go to the emergency room.    Infection: usually indicated by pain that increases 4 to 6 days after surgery. Mild redness, swelling, soreness are normal, but, with increasing redness, pain, drainage, and swelling, you should contact our office.    Decreased sensation, increased sensitivity, and itching may last for 18 months.    Scarring: scars mature for one year after surgery. Reducing motion and tension over the wound for the 1st month, as well as using sun protection, reduces scarring.    Bruising is normal around the wound and resolves over 2 to 3 weeks.    For wounds with dissolving stitches:  Please keep the original bandage in place for at least 48 hours. You can remove the large outer layer at that time and keep the small bandage clean and dry for one week or until you return. If the  bandage becomes soiled, wet, or detached, you can reinforce it with tape or add petrolatum  (Vaseline) to the stitches, then place a fresh, clean, nonstick bandage (telfa) with tape or use a non stick Band-Aid.    For wounds with stitches that need to be removed:  Please keep the original bandage in place for at least 48 hours. You can remove the large outer bandage at that time. You may leave the smaller bandage in place until it falls off by itself, or change it after it gets wet with bathing. To change, apply a layer of petrolatum (Vaseline) over top of the Steri-Strips (thin white stripes) and cover with a nonstick stick bandage (telfa) and  paper tape or use a non stick Band-Aid.  If you???re changing the bandage, you can gently remove any crust with warm water and mild soap using a soft cloth a Q-tip. Do not use triple antibiotic ointment, Neosporin, or hydrogen peroxide.    After the stitches and bandages have been removed: clean daily with water with warm water and gentle soap using a soft cloth. You can continue to use a small bandage and sunscreen for one to 2 months or until fully healed.  Please also feel free to call the clinic at [phone] with any concerns. We look forward to seeing you again!

## 2021-04-09 NOTE — Unmapped (Signed)
PROCEDURE NOTE:  Excision    INDICATION: Treatment/removal of EIC    Location: upper back    Preoperative Pathology Report: N/A    Time out taken: Patient Verification with name, medical record number, and date of birth performed.   Site and procedure verified with patient/physician agreement and clinical photograph.   2 Health Care Workers involved in verification.   Consent form:.  Implantable device: no             Allergies to Anesthetics: no  Intolerance to Epinephrine: no       Anticoagulants: no   Major Medical Problems: DM, PG  Time: 10:53AM    Personnel present during procedure/verification: Louanne Skye, MD, Betty  Operating surgeon (s): Louanne Skye, MD     The size and nature of the excision as well as typical scarring were discussed along with, among others, the risks of bleeding, infection, cutaneous dysesthesia, positive margins, and lesion recurrence.  Written consent obtained/form signed.    EXCISION  Anesthesia: lidocaine 0.5% with epinephrine was infiltrated at the site with a total of 10 ml injected.   The surgical site was marked, cleansed with chlorhexidine x 2, and draped in a sterile fashion.      Measurements:  Tumor diameter: 40 mm     Margins: 0 mm     Total excision (tumor + margins) size:  40 mm.    Site was excised to the subcutaneous fat.   Specimen tagged: n/a.   Undermining was performed, and hemostasis was accomplished with electrocautery and pressure.     CLOSURE  Linear layered closure performed to close potential space with:  Buried vertical mattress sutures:   3-0 Vicryl    Superficial suture:   Simple running  4-0 Prolene    Length of closure: 45 mm    ANTIBIOTICS: None     DRESSING  A dry pressure dressing was applied to the wound site over petrolatum.   Wound healing expectations and the specifics of wound cleansing and bandaging were discussed in detail. The patient was given a handout reiterating instructions.     Suture removal plan: 2 weeks at outside office.  Patient will let us know if his wound care clinic is unable to remove his sutures in 2 weeks.    RTC: Return for 2 week suture removal if needed. pt will call.Marland Kitchen

## 2021-04-09 NOTE — Progress Notes (Signed)
[name], [name] ([id number]) Visit Report for 04/06/2021 Arrival Information Details Patient Name: [name], [name] Date of Service: 04/06/2021 8:00 AM Medical Record Number: [id number] Patient Account Number: [id number] Date of Birth/Sex: [birth year] (40 y.o. M) Treating RN: Donnamarie Poag Primary Care Teriyah Purington: [name] Other Clinician: Referring Jhonatan Lomeli: [name] Treating Jeanny Rymer/Extender: [name] Cliche in Treatment: 62 Visit Information History Since Last Visit Added or deleted any medications: No Patient Arrived: Ambulatory Had a fall or experienced change in No Arrival Time: 08:04 activities of daily living that may affect Accompanied By: self risk of falls: Transfer Assistance: None Hospitalized since last visit: No Patient Identification Verified: Yes Has Dressing in Place as Prescribed: Yes Secondary Verification Process Completed: Yes Has Compression in Place as Prescribed: Yes Patient Requires Transmission-Based No Pain Present Now: No Precautions: Patient Has Alerts: Yes Patient Alerts: Patient has reaction to silver dressings. Electronic Signature(s) Signed: 04/09/2021 10:03:00 AM By: Donnamarie Poag Entered By: Donnamarie Poag on 04/06/2021 08:20:39 [name] ([id number]) -------------------------------------------------------------------------------- Clinic Level of Care Assessment Details Patient Name: [name] Date of Service: 04/06/2021 8:00 AM Medical Record Number: [id number] Patient Account Number: [id number] Date of Birth/Sex: 1980/05/23 (40 y.o. M) Treating RN: Donnamarie Poag Primary Care Senetra Dillin: [name] Other Clinician: Referring Makinsley Schiavi: [name] Treating Tameko Halder/Extender: [name] Cliche in Treatment: 226 Clinic Level of Care Assessment Items TOOL 1 Quantity Score []  - Use when EandM and Procedure is performed on INITIAL visit 0 ASSESSMENTS - Nursing Assessment / Reassessment []  - General Physical Exam (combine  w/ comprehensive assessment (listed just below) when performed on new 0 pt. evals) []  - 0 Comprehensive Assessment (HX, ROS, Risk Assessments, Wounds Hx, etc.) ASSESSMENTS - Wound and Skin Assessment / Reassessment []  - Dermatologic / Skin Assessment (not related to wound area) 0 ASSESSMENTS - Ostomy and/or Continence Assessment and Care []  - Incontinence Assessment and Management 0 []  - 0 Ostomy Care Assessment and Management (repouching, etc.) PROCESS - Coordination of Care []  - Simple Patient / Family Education for ongoing care 0 []  - 0 Complex (extensive) Patient / Family Education for ongoing care []  - 0 Staff obtains Programmer, systems, Records, Test Results / Process Orders []  - 0 Staff telephones HHA, Nursing Homes / Clarify orders / etc []  - 0 Routine Transfer to another Facility (non-emergent condition) []  - 0 Routine Hospital Admission (non-emergent condition) []  - 0 New Admissions / Biomedical engineer / Ordering NPWT, Apligraf, etc. []  - 0 Emergency Hospital Admission (emergent condition) PROCESS - Special Needs []  - Pediatric / Minor Patient Management 0 []  - 0 Isolation Patient Management []  - 0 Hearing / Language / Visual special needs []  - 0 Assessment of Community assistance (transportation, D/C planning, etc.) []  - 0 Additional assistance / Altered mentation []  - 0 Support Surface(s) Assessment (bed, cushion, seat, etc.) INTERVENTIONS - Miscellaneous []  - External ear exam 0 []  - 0 Patient Transfer (multiple staff / Civil Service fast streamer / Similar devices) []  - 0 Simple Staple / Suture removal (25 or less) []  - 0 Complex Staple / Suture removal (26 or more) []  - 0 Hypo/Hyperglycemic Management (do not check if billed separately) []  - 0 Ankle / Brachial Index (ABI) - do not check if billed separately Has the patient been seen at the hospital within the last three years: Yes Total Score: 0 Level Of Care: ____ [name] ([id number]) Electronic  Signature(s) Signed: 04/09/2021 10:03:00 AM By: Donnamarie Poag Entered By: Donnamarie Poag on 04/06/2021 08:23:04 [name] ([id number]) -------------------------------------------------------------------------------- Compression Therapy Details Patient Name: [name]  Date of Service: 04/06/2021 8:00 AM Medical Record Number: [id number] Patient Account Number: [id number] Date of Birth/Sex: 06/19/1980 (40 y.o. M) Treating RN: Donnamarie Poag Primary Care Bernie Ransford: [name] Other Clinician: Referring Jurgen Groeneveld: [name] Treating Shaquira Moroz/Extender: [name] Cliche in Treatment: 226 Compression Therapy Performed for Wound Assessment: Wound #1 Left,Lateral Lower Leg Performed By: Junius Argyle, RN Compression Type: Four Layer Electronic Signature(s) Signed: 04/09/2021 10:03:00 AM By: Donnamarie Poag Entered By: Donnamarie Poag on 04/06/2021 08:22:26 [name] ([id number]) -------------------------------------------------------------------------------- Encounter Discharge Information Details Patient Name: [name] Date of Service: 04/06/2021 8:00 AM Medical Record Number: [id number] Patient Account Number: [id number] Date of Birth/Sex: 05-Aug-1980 (40 y.o. M) Treating RN: Donnamarie Poag Primary Care Priscila Bean: [name] Other Clinician: Referring Quanah Majka: [name] Treating Trig Mcbryar/Extender: [name] Cliche in Treatment: 226 Encounter Discharge Information Items Discharge Condition: Stable Ambulatory Status: Ambulatory Discharge Destination: Home Transportation: Private Auto Accompanied By: self Schedule Follow-up Appointment: Yes Clinical Summary of Care: Electronic Signature(s) Signed: 04/09/2021 10:03:00 AM By: Donnamarie Poag Entered By: Donnamarie Poag on 04/06/2021 08:22:58 [name] ([id number]) -------------------------------------------------------------------------------- Wound Assessment Details Patient Name: [name] Date of Service:  04/06/2021 8:00 AM Medical Record Number: [id number] Patient Account Number: [id number] Date of Birth/Sex: 04-09-1981 (40 y.o. M) Treating RN: Donnamarie Poag Primary Care Aadarsh Cozort: [name] Other Clinician: Referring Blayde Bacigalupi: [name] Treating Neithan Day/Extender: [name] Cliche in Treatment: 226 Wound Status Wound Number: 1 Primary Etiology: Pyoderma Wound Location: Left, Lateral Lower Leg Wound Status: Open Wounding Event: Gradually Appeared Comorbid History: Sleep Apnea, Hypertension, Colitis Date Acquired: 11/18/2015 Weeks Of Treatment: 226 Clustered Wound: No Wound Measurements Length: (cm) 5.4 Width: (cm) 2.5 Depth: (cm) 0.4 Area: (cm) 10.603 Volume: (cm) 4.241 % Reduction in Area: -116% % Reduction in Volume: -8% Wound Description Classification: Full Thickness With Exposed Support Structures Exudate Amount: Medium Exudate Type: Serosanguineous Exudate Color: red, brown Foul Odor After Cleansing: No Slough/Fibrino Yes Wound Bed Granulation Amount: Large (67-100%) Granulation Quality: Red, Pink Necrotic Amount: Small (1-33%) Necrotic Quality: Adherent Slough Electronic Signature(s) Signed: 04/09/2021 10:03:00 AM By: Donnamarie Poag Entered ByDonnamarie Poag on 04/06/2021 08:22:12

## 2021-04-09 NOTE — Progress Notes (Addendum)
RANULFO, KALL ([id number]) Visit Report for 04/09/2021 Chief Complaint Document Details Patient Name: [name], [name] Date of Service: 04/09/2021 8:15 AM Medical Record Number: [id number] Patient Account Number: [id number] Date of Birth/Sex: 1980-11-19 (40 y.o. 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 M) Treating RN: [name] Primary Care Provider: [name] Other Clinician: Referring Provider: [name] Treating Provider/Extender: [name] Cliche in Treatment: 23 History of Present Illness HPI Description: 12/04/16; 40 year old man who comes into the clinic today for review of a wound on the posterior left calf. He tells me that is been there for about a year. He is not a diabetic he does smoke half a pack per day. He was seen in the ER on 11/20/16 felt to have cellulitis around the wound and was given clindamycin. An x-ray did not show osteomyelitis. The patient initially tells me that he has a milk allergy that sets off a pruritic itching rash on his lower legs which she scratches incessantly and he thinks that's what may have set up the wound. He has been using various topical antibiotics and ointments without any effect. He works in a [employer] and is on his feet all day. He does not have a prior history  of wounds however he does have the rash on both lower legs the right arm and the ventral aspect of his left arm. These are excoriations and clearly have had scratching however there are of macular looking areas on both legs including a substantial larger area on the right leg. This does not have an underlying open area. There is no blistering. The patient tells me that 2 years ago in [place] in response to the rash on his legs he saw a dermatologist who told him he had a condition which may be pyoderma gangrenosum although I may be putting words into his mouth. He seemed to recognize this. On further questioning he admits to a 5 year history of quiesced. ulcerative colitis. He is not in any treatment for this. He's had no recent travel 12/11/16; the patient arrives today with his wound and roughly the same condition we've been using silver alginate this is a deep punched out wound with some surrounding erythema but no tenderness. Biopsy I did did not show confirmed pyoderma gangrenosum suggested nonspecific inflammation and vasculitis but does not provide an actual description of what was seen by the pathologist. I'm really not able to understand this We have also received information from the patient's dermatologist in [place] notes from April 2016. This was a doctor Agarwal-antal. 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 He was prescribed topical steroid high potency under occlusion which helped but at this point the patient did not have a deep punched out wound. 12/18/16; the patient's wound is larger in terms of surface area however this surface looks better and there is less depth. The surrounding erythema also is better. The patient states that the wrap we put on came off 2 days ago when he has been using his compression stockings.  He we are in the process of getting a dermatology consult. 12/26/16 on evaluation today patient's left lower extremity wound shows evidence of infection with  surrounding erythema noted. He has been tolerating the dressing changes but states that he has noted more discomfort. There is a larger area of erythema surrounding the wound. No fevers, chills, nausea, or vomiting noted at this time. With that being said the wound still does have slough covering the surface. He is not allergic to any medication that he is aware of at this point. In regard to his right lower extremity he had several regions that are erythematous and pruritic he wonders if there's anything we can do to help that. 01/02/17 I reviewed patient's wound culture which was obtained his visit last week. He was placed on doxycycline at that point. Unfortunately that does not appear to be an antibiotic that would likely help with the situation however the pseudomonas noted on culture is sensitive to Cipro. Also unfortunately patient's wound seems to have a large compared to last week's evaluation. Not severely so but there are definitely increased measurements in general. He is continuing to have discomfort as well he writes this to be a seven out of 10. In fact he would prefer me not to perform any debridement today due to the fact that he is having discomfort and considering he has an active infection on the little reluctant to do so anyway. No fevers, chills, nausea, or vomiting noted at this time. 01/08/17; patient seems dermatology on September 5. I suspect dermatology will want the slides from the biopsy I did sent to their pathologist. I'm not sure if there is a way we can expedite that. In any case the culture I did before I left on vacation 3 weeks ago showed Pseudomonas he was given 10 days of Cipro and per her description of her intake nurses is actually somewhat better this week although the wound is quite a bit bigger than I remember the last time I saw this. He still has 3 more days of Cipro 01/21/17; dermatology appointment tomorrow. He has completed the ciprofloxacin for Pseudomonas.  Surface of the wound looks better however he is had some deterioration in the lesions on his right leg. Meantime the left lateral leg wound we will continue with sample 01/29/17; patient had his dermatology appointment but I can't yet see that note. He is completed his antibiotics. The wound is more superficial but considerably larger in circumferential area than when he came in. This is in his left lateral calf. He also has swollen erythematous areas with superficial wounds on the right leg and small papular areas on both arms. There apparently areas in her his upper thighs and buttocks I did not look at those. Dermatology biopsied the right leg. Hopefully will have their input next week. 02/05/17; patient went back to see his dermatologist who told him that he had a "scratching problem" as well as staph. He is now on a 30 day course of doxycycline and I believe she gave him triamcinolone cream to the right leg areas to help with the itching [not exactly sure but probably triamcinolone]. She apparently looked at the left lateral leg wound although this was not rebiopsied and I think felt to be ultimately part of the same pathogenesis. He is using sample border foam and changing nevus himself. He now has a new open area on the right posterior leg which was his biopsy site I don't have any of the dermatology notes  02/12/17; we put the patient in compression last week with SANTYL to the wound on the left leg and the biopsy. Edema is much better and the depth of the wound is now at level of skin. Area is still the same oBiopsy site on the right lateral leg we've also been using santyl with a border foam dressing and he is changing this himself. 02/19/17; Using silver alginate started last week to both the substantial left leg wound and the biopsy site on the right wound. He is tolerating compression well. Has a an appointment with his primary M.D. tomorrow wondering about diuretics although I'm wondering if  the edema problem is actually lymphedema 02/26/17; the patient has been to see his primary doctor Dr. Jerrel Ivory at [facility] our primary care. She started him on Lasix 20 mg and this seems to have helped with the edema. However we are not making substantial change with the left lateral calf wound and inflammation. The biopsy site on the right leg also looks stable but not really all that different. 03/12/17; the patient has been to see vein and vascular Dr. Lucky Cowboy. He has had venous reflux studies I have not reviewed these. I did get a call from his dermatology office. They felt that he might have pathergy based on their biopsy on his right leg which led them to look at the slides of VIN, YONKE ([id number]) the biopsy I did on the left leg and they wonder whether this represents pyoderma gangrenosum which was the original supposition in a man with ulcerative colitis albeit inactive for many years. They therefore recommended clobetasol and tetracycline i.e. aggressive treatment for possible pyoderma gangrenosum. 03/26/17; apparently the patient just had reflux studies not an appointment with Dr. dew. She arrives in clinic today having applied clobetasol for 2-3 weeks. He notes over the last 2-3 days excessive drainage having to change the dressing 3-4 times a day and also expanding erythema. 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 Lillia Carmel at [facility] dermatology locally who kindly saw him at my request. 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 My concern was the degree of erythema medially extending from his left leg wound which was either inflammation from pyoderma or cellulitis. I  put him on Augmentin however culture of the wound showed Pseudomonas which is quinolone sensitive. I really don't believe he has cellulitis however in view of everything I will continue and give him a course of Cipro. He is also on doxycycline as an immune modulator for the pyoderma. In addition to his original wound on the left lateral leg with surrounding erythema he has a wound on the right posterior calf which was an original biopsy site done by dermatology. 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 Saw Dr. Lillia Carmel yesterday. He has been using topical antibiotics to both wound areas his original wound on the left and the biopsies/pathergy area on the right. There is definitely some improvement in the inflammation around the wound on the right although the patient states he has increasing sensitivity of the wounds. He is on prednisone 60 and doxycycline 1 as prescribed by Dr. Lillia Carmel. He is covering the topical antibiotic with gauze  and putting this in his own compression stocks and changing this daily. He states that Dr. Lottie Rater did a culture of the left leg wound yesterday 05/07/17; pyoderma gangrenosum. The patient saw Dr. Lillia Carmel yesterday and has a follow-up with her in one month. He is still using topical antibiotics to both wounds although he can't recall exactly what type. He is still on prednisone 60 mg. Dr. Lillia Carmel stated that the doxycycline could stop if we were in agreement. 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 The right medial leg was induced by biopsy/pathergy. The area on the right is essentially healed. 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 The right medial leg has closed and remains closed. He is still on  prednisone 60. oHe tells me he missed his last dermatology appointment with Dr. Lillia Carmel but will make another appointment. He reports that her blood sugar at a recent screen in [place] was high 200's. He was 180 today. 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 I think he is going to require still much longer prednisone perhaps another 3 months before attempting to taper. In the meantime his wound is a lot better. Smaller. He is cleaning this off daily and applying topical antibiotics. When he was last in the clinic I thought about changing to [facility] and actually put in a couple of calls to dermatology although probably not during their business hours. 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 He continues on oral prednisone. He has been using triple antibiotic ointment. There is surface debris and we will transition to [facility] and have him return in 2 weeks. He has lost 30 pounds since his last appointment with lifestyle modification. He may benefit from topical steroid cream for treatment this can be considered at a later date. 08/22/17 on evaluation today patient appears to actually be doing rather well in regard to his left lateral lower extremity ulcer. He has actually been managed by Dr. Dellia Nims most recently. Patient is currently on oral steroids at this time. This seems to have been of benefit for him. Nonetheless his last visit was actually with Leah on 08/06/17. Currently he is not utilizing any topical steroid creams although this could be of benefit as well. No fevers, chills, nausea, or vomiting noted at this time. 09/05/17 on evaluation today patient appears to be doing better in regard to his left lateral lower extremity ulcer. He has been tolerating the dressing changes without complication. He is using Santyl with good effect. Overall I'm very  pleased with how things are standing at this point. Patient likewise is happy that this is doing better. 09/19/17 on evaluation today patient actually appears to be doing rather well in regard to his left lateral lower extremity ulcer. 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 He's not having any significant pain. 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 He has been tolerating the Santyl without complication and in general I feel like he's making good progress. 10/17/17 on evaluation today patient  appears to be doing very well in regard to his left lateral lower surety ulcer. He has been tolerating the dressing changes without complication. There does not appear to be any evidence of infection he's alternating the Santyl and the triple antibiotic ointment every other day this seems to be doing well for him. 11/03/17 on evaluation today patient appears to be doing very well in regard to his left lateral lower extremity ulcer. He is been tolerating the dressing changes without complication which is good news. Fortunately there does not appear to be any evidence of infection which is also great news. Overall is doing excellent they are starting to taper down on the prednisone is down to 40 mg at this point it also started topical clobetasol for him. 11/17/17 on evaluation today patient appears to be doing well in regard to his left lateral lower surety ulcer. He's been tolerating the dressing changes without complication. He does note that he is having no pain, no excessive drainage or discharge, and overall he feels like things are going about how he would expect and hope they would. Overall he seems to have no evidence of infection at this time in my opinion which is good news. 12/04/17-He is seen in follow-up evaluation  for right lateral lower extremity ulcer. He has been applying topical steroid cream. Today's measurement show slight increase in size. Over the next 2 weeks we will transition to every other day Santyl and steroid cream. He has been encouraged to monitor for changes and notify clinic with any concerns 12/15/17 on evaluation today patient's left lateral motion the ulcer and fortunately is doing worse again at this point. This just since last week to this week has close to doubled in size according to the patient. I did not seeing last week's I do not have a visual to compare this to in our system was also down so we do not have all the charts and at this point. Nonetheless it does have me somewhat concerned in regard to the fact that again he was worried enough about it he has contact the dermatology that placed them back on the full strength, 50 mg a day of the prednisone that he was taken previous. He continues to alternate using clobetasol along with Santyl at this point. He is obviously somewhat frustrated. 12/22/17 on evaluation today patient appears to be doing a little worse compared to last evaluation. Unfortunately the wound is a little deeper and slightly larger than the last week's evaluation. With that being said he has made some progress in regard to the irritation surrounding at this time unfortunately despite that progress that's been made he still has a significant issue going on here. 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 ASAEL, PANN ([id number]) For that reason I discussed with him today the possibility of perform a wound culture to ensure there's nothing overtly infected. 01/06/18 on evaluation today patient's wound is larger and deeper than previously evaluated. With that being said it did appear that his wound was infected after  my last evaluation with him. Subsequently I did end up prescribing a prescription for Bactrim DS which she has been taking and having no complication with. Fortunately there does not appear to be any evidence of infection at this point in time as far as anything spreading, no want to touch, and overall I feel like  things are showing signs of improvement. 01/13/18 on evaluation today patient appears to be even a little larger and deeper than last time. There still muscle exposed in the base of the wound. Nonetheless he does appear to be less erythematous I do believe inflammation is calming down also believe the infection looks like it's probably resolved at this time based on what I'm seeing. No fevers, chills, nausea, or vomiting noted at this time. 01/30/18 on evaluation today patient actually appears to visually look better for the most part. Unfortunately those visually this looks better he does seem to potentially have what may be an abscess in the muscle that has been noted in the central portion of the wound. This is the first time that I have noted what appears to be fluctuance in the central portion of the muscle. With that being said I'm somewhat more concerned about the fact that this might indicate an abscess formation at this location. I do believe that an ultrasound would be appropriate. This is likely something we need to try to do as soon as possible. He has been switch to mupirocin ointment and he is no longer using the steroid ointment as prescribed by dermatology he sees them again next week he's been decreased from 60 to 40 mg of prednisone. 03/09/18 on evaluation today patient actually appears to be doing a little better compared to last time I saw him. There's not as much erythema surrounding the wound itself. He I did review his most recent infectious disease note which was dated 02/24/18. He saw Dr. Michel Bickers in [place]. 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 Patient is now off of antibiotics and they are continually observing this. There seems to be no change in the past two weeks in my pinion based on what the patient says and what I see today compared to what Dr. Megan Salon likely saw two weeks ago. No fevers, chills, nausea, or vomiting noted at this time. 03/23/18 on evaluation today patient's wound actually appears to be showing signs of improvement which is good news. He is currently still on the Dapsone. He is also working on tapering the prednisone to get off of this and Dr. Lottie Rater is working with him in this regard. Nonetheless overall I feel like the wound is doing well it does appear based on the infectious disease note that I reviewed from Dr. 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 I did also review the results of his ultrasound of left lower extremity which revealed there was a dentist tissue in the base of the wound without an abscess noted. 04/06/18 on evaluation today the patient's left lateral lower extremity ulcer actually appears to be doing fairly well which is excellent news. There does not appear to be any evidence of infection at this time which is also great news. Overall he still does have a significantly large ulceration although little by little he seems to be making progress. He is down to 10 mg a day of the prednisone. 04/20/18 on evaluation today patient actually appears to be doing excellent at this time in regard to his left lower extremity ulcer. He's making signs of good progress unfortunately this is taking much longer than we would really like to see but nonetheless he is  making progress. Fortunately there does not appear to be any evidence of infection at this time. No fevers, chills, nausea, or vomiting noted at this time.  The patient has not been using the Santyl due to the cost he hadn't got in this field yet. He's mainly been using the antibiotic ointment topically. Subsequently he also tells me that he really has not been scrubbing in the shower I think this would be helpful again as I told him it doesn't have to be anything too aggressive to even make it believe just enough to keep it free of some of the loose slough and biofilm on the wound surface. 05/11/18 on evaluation today patient's wound appears to be making slow but sure progress in regard to the left lateral lower extremity ulcer. He is been tolerating the dressing changes without complication. Fortunately there does not appear to be any evidence of infection at this time. He is still just using triple antibiotic ointment along with clobetasol occasionally over the area. He never got the Santyl and really does not seem to intend to in my pinion. 06/01/18 on evaluation today patient appears to be doing a little better in regard to his left lateral lower extremity ulcer. He states that overall he does not feel like he is doing as well with the Dapsone as he did with the prednisone. Nonetheless he sees his dermatologist later today and is gonna talk to them about the possibility of going back on the prednisone. Overall again I believe that the wound would be better if you would utilize Santyl but he really does not seem to be interested in going back to the [facility] at this point. He has been using triple antibiotic ointment. 06/15/18 on evaluation today patient's wound actually appears to be doing about the same at this point. Fortunately there is no signs of infection at this time. He has made slight improvements although he continues to not really want to clean the wound bed at this point. He states that he just doesn't mess with it he doesn't want to cause any problems with everything else he has going on. He has been on medication, antibiotics as prescribed  by his dermatologist, for a staff infection of his lower extremities which is really drying out now and looking much better he tells me. Fortunately there is no sign of overall infection. 06/29/18 on evaluation today patient appears to be doing well in regard to his left lateral lower surety ulcer all things considering. Fortunately his staff infection seems to be greatly improved compared to previous. He has no signs of infection and this is drying up quite nicely. He is still the doxycycline for this is no longer on cental, Dapsone, or any of the other medications. His dermatologist has recommended possibility of an infusion but right now he does not want to proceed with that. 07/13/18 on evaluation today patient appears to be doing about the same in regard to his left lateral lower surety ulcer. Fortunately there's no signs of infection at this time which is great news. Unfortunately he still builds up a significant amount of Slough/biofilm of the surface of the wound he still is not really cleaning this as he should be appropriately. Again I'm able to easily with saline and gauze remove the majority of this on the surface which if you would do this at home would likely be a dramatic improvement for him as far as getting the area to improve. Nonetheless overall I still feel like he  is making progress is just very slow. I think Santyl will be of benefit for him as well. Still he has not gotten this as of this point. 07/27/18 on evaluation today patient actually appears to be doing little worse in regards of the erythema around the periwound region of the wound he also tells me that he's been having more drainage currently compared to what he was experiencing last time I saw him. He states not quite as bad as what he had because this was infected previously but nonetheless is still appears to be doing poorly. Fortunately there is no evidence of systemic infection at this point. The patient tells me that  he is not going to be able to afford the Santyl. He is still waiting to hear about the infusion therapy with his dermatologist. Apparently she wants an updated colonoscopy first. 08/10/18 on evaluation today patient appears to be doing better in regard to his left lateral lower extremity ulcer. Fortunately he is showing signs of improvement in this regard he's actually been approved for Remicade infusion's as well although this has not been scheduled as of yet. Fortunately there's no signs of active infection at this time in regard to the wound although he is having some issues with infection of the right lower extremity is been seen as dermatologist for this. Fortunately they are definitely still working with him trying to keep things under control. EDWAR, COE ([id number]) 09/07/18 on evaluation today patient is actually doing rather well in regard to his left lateral lower extremity ulcer. He notes these actually having some hair grow back on his extremity which is something he has not seen in years. He also tells me that the pain is really not giving them any trouble at this time which is also good news overall she is very pleased with the progress he's using a combination of the mupirocin along with the probate is all mixed. 09/21/18 on evaluation today patient actually appears to be doing fairly well all things considered in regard to his looks from the ulcer. He's been tolerating the dressing changes without complication. Fortunately there's no signs of active infection at this time which is good news he is still on all antibiotics or prevention of the staff infection. He has been on prednisone for time although he states it is gonna contact his dermatologist and see if she put them on a short course due to some irritation that he has going on currently. Fortunately there's no evidence of any overall worsening this is going very slow I think cental would be something that would be helpful for him  although he states that $50 for tube is quite expensive. He therefore is not willing to get that at this point. 10/06/18 on evaluation today patient actually appears to be doing decently well in regard to his left lateral leg ulcer. He's been tolerating the dressing changes without complication. Fortunately there's no signs of active infection at this time. Overall I'm actually rather pleased with the progress he's making although it's slow he doesn't show any signs of infection and he does seem to be making some improvement. I do believe that he may need a switch up and dressings to try to help this to heal more appropriately and quickly. 10/19/18 on evaluation today patient actually appears to be doing better in regard to his left lateral lower extremity ulcer. This is shown signs of having much less Slough buildup at this point due to the fact he has been using  the Santyl. Obviously this is very good news. The overall size of the wound is not dramatically smaller but again the appearance is. 11/02/18 on evaluation today patient actually appears to be doing quite well in regard to his lower [place] ulcer. A lot of the skin around the ulcer is actually somewhat irritating at this point this seems to be more due to the dressing causing irritation from the adhesive that anything else. Fortunately there is no signs of active infection at this time. 11/24/18 on evaluation today patient appears to be doing a little worse in regard to his overall appearance of his lower extremity ulcer. There's more erythema and warmth around the wound unfortunately. He is currently on doxycycline which he has been on for some time. With that being said I'm not sure that seems to be helping with what appears to possibly be an acute cellulitis with regard to his left lower extremity ulcer. No fevers, chills, nausea, or vomiting noted at this time. 12/08/18 on evaluation today patient's wounds actually appears to be doing  significantly better compared to his last evaluation. He has been using Santyl along with alternating tripling about appointment as well as the steroid cream seems to be doing quite well and the wound is showing signs of improvement which is excellent news. Fortunately there's no evidence of infection and in fact his culture came back negative with only normal skin flora noted. 12/21/2018 upon evaluation today patient actually appears to be doing excellent with regard to his ulcer. This is actually the best that I have seen it since have been helping to take care of him. It is both smaller as well as less slough noted on the surface of the wound and seems to be showing signs of good improvement with new skin growing from the edges. He has been using just the triamcinolone he does wonder if he can get a refill of that ointment today. 01/04/2019 upon evaluation today patient actually appears to be doing well with regard to his left lateral lower extremity ulcer. With that being said it does not appear to be that he is doing quite as well as last time as far as progression is concerned. There does not appear to be any signs of infection or significant irritation which is good news. With that being said I do believe that he may benefit from switching to a collagen based dressing based on how clean The wound appears. 01/18/2019 on evaluation today patient actually appears to be doing well with regard to his wound on the left lower extremity. He is not made a lot of progress compared to where we were previous but nonetheless does seem to be doing okay at this time which is good news. There is no signs of active infection which is also good news. My only concern currently is I do wish we can get him into utilizing the collagen dressing his insurance would not pay for the supplies that we ordered although it appears that he may be able to order this through his supply company that he typically utilizes. This is  [employer]. Nonetheless he did try to order it during the office visit today and it appears this did go through. We will see if he can get that it is a different brand but nonetheless he has collagen and I do think will be beneficial. 02/01/2019 on evaluation today patient actually appears to be doing a little worse today in regard to the overall size of his wounds. Fortunately there  is no signs of active infection at this time. That is visually. Nonetheless when this is happened before it was due to infection. For that reason were somewhat concerned about that this time as well. 02/08/2019 on evaluation today patient unfortunately appears to be doing slightly worse with regard to his wound upon evaluation today. Is measuring a little deeper and a little larger unfortunately. I am not really sure exactly what is causing this to enlarge he actually did see his dermatologist she is going to see about initiating Humira for him. Subsequently she also did do steroid injections into the wound itself in the periphery. Nonetheless still nonetheless he seems to be getting a little bit larger he is gone back to just using the steroid cream topically which I think is appropriate. I would say hold off on the collagen for the time being is definitely a good thing to do. Based on the culture results which we finally did get the final result back regarding it shows staph as the bacteria noted again that can be a normal skin bacteria based on the fact however he is having increased drainage and worsening of the wound measurement wise I would go ahead and place him on an antibiotic today I do believe for this. 02/15/2019 on evaluation today patient actually appears to be doing somewhat better in regard to his ulcer. There is no signs of worsening at this time I did review his culture results which showed evidence of Staphylococcus aureus but not MRSA. Again this could just be more related to the normal skin bacteria  although he states the drainage has slowed down quite a bit he may have had a mild infection not just colonization. And was much smaller and then since around10/04/2019 on evaluation today patient appears to be doing unfortunately worse as far as the size of the wound. I really feel like that this is steadily getting larger again it had been doing excellent right at the beginning of September we have seen a steady increase in the area of the wound it is almost 2-1/2 times the size it was on September 1. Obviously this is a bad trend this is not wanting to see. For that reason we went back to using just the topical triamcinolone cream which does seem to help with inflammation. I checked him for bacteria by way of culture and nothing showed positive there. I am considering giving him a short course of a tapering steroid Dosepak today to see if that is can be beneficial for him. The patient is in agreement with giving that a try. 03/08/2019 on evaluation today patient appears to be doing very well in comparison to last evaluation with regard to his lower extremity ulcer. This is showing signs of less inflammation and actually measuring slightly smaller compared to last time every other week over the past month and a half he has been measuring larger larger larger. Nonetheless I do believe that the issue has been inflammation the prednisone does seem to [facility], Zeki ([id number]) have been beneficial for him which is good news. No fevers, chills, nausea, vomiting, or diarrhea. 03/22/2019 on evaluation today patient appears to be doing about the same with regard to his leg ulcer. He has been tolerating the dressing changes without complication. With that being said the wound seems to be mostly arrested at its current size but really is not making any progress except for when we prescribed the prednisone. He did show some signs of dropping as far as  the overall size of the wound during that interval week.  Nonetheless this is something he is not on long-term at this point and unfortunately I think he is getting need either this or else the Humira which his dermatologist has discussed try to get approval for. With that being said he will be seeing his dermatologist on the 11th of this month that is November. 04/19/2019 on evaluation today patient appears to be doing really about the same the wound is measuring slightly larger compared to last time I saw him. He has not been into the office since November 2 due to the fact that he unfortunately had Covid as that his entire family. He tells me that it was rough but they did pull-through and he seems to be doing much better. Fortunately there is no signs of active infection at this time. No fevers, chills, nausea, vomiting, or diarrhea. 05/10/2019 on evaluation today patient unfortunately appears to be doing significantly worse as compared to last time I saw him. He does tell me that he has had his first dose of Humira and actually is scheduled to get the next one in the upcoming week. With that being said he tells me also that in the past several days he has been having a lot of issues with green drainage she showed me a picture this is more blue-green in color. He is also been having issues with increased sloughy buildup and the wound does appear to be larger today. Obviously this is not the direction that we want everything to take based on the starting of his Humira. Nonetheless I think this is definitely a result of likely infection and to be honest I think this is probably Pseudomonas causing the infection based on what I am seeing. 05/24/2019 on evaluation today patient unfortunately appears to be doing significantly worse compared to his prior evaluation with me 2 weeks ago. I did review his culture results which showed that he does have Staph aureus as well as Pseudomonas noted on the culture. Nonetheless the Levaquin that I prescribed for him does  not appear to have been appropriate and in fact he tells me he is no longer experiencing the green drainage and discharge that he had at the last visit. Fortunately there is no signs of active infection at this time which is good news although the wound has significantly worsened it in fact is much deeper than it was previous. We have been utilizing up to this point triamcinolone ointment as the prescription topical of choice but at this time I really feel like that the wound is getting need to be packed in order to appropriately manage this due to the deeper nature of the wound. Therefore something along the lines of an alginate dressing may be more appropriate. 05/31/2019 upon inspection today patient's wound actually showed signs of doing poorly at this point. Unfortunately he just does not seem to be making any good progress despite what we have tried. He actually did go ahead and pick up the Cipro and start taking that as he was noticing more green drainage he had previously completed the Levaquin that I prescribed for him as well. Nonetheless he missed his appointment for the seventh last week on Wednesday with the wound care center and [facility] where his dermatologist referred him. Obviously I do think a second opinion would be helpful at this point especially in light of the fact that the patient seems to be doing so poorly despite the fact  that we have tried everything that I really know how at this point. The only thing that ever seems to have helped him in the past is when he was on high doses of continual steroids that did seem to make a difference for him. 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 06/07/2019 upon evaluation today patient unfortunately appears to be doing worse yet again with regard to his wound. In fact I am  starting to question whether or not he may have a fluid pocket in the muscle at this point based on the bulging and the soft appearance to the central portion of the muscle area. There is not anything draining from the muscle itself at this time which is good news but nonetheless the wound is expanding. I am not really seeing any results of the Humira as far as overall wound progression based on what I am seeing at this point. The patient has been referred for second opinion with regard to his wound to the [facility] wound care center by his dermatologist which I definitely am not in opposition to. Unfortunately we tried multiple dressings in the past including collagen, alginate, and at one point even Hydrofera Blue. With that being said he is never really used it for any significant amount of time due to the fact that he often complains of pain associated with these dressings and then will go back to either using the Santyl which she has done intermittently or more frequently the triamcinolone. He is also using his own compression stockings. We have wrapped him in the past but again that was something else that he really was not a big fan of. 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 He seemed to do much better when he was on the steroids although honestly I understand that the steroids are not necessarily the best medication to be on long-term obviously 06/14/2019 on evaluation today patient appears to be doing actually a little bit better with regard to the overall appearance with his leg. Unfortunately he does continue to have issues with what appears to be some fluid underneath the  muscle although he did see the wound specialty center at [facility] last week their main goals were to see about infusion therapy in place of the Humira as they feel like that is not quite strong enough. They also recommended that we continue with the treatment otherwise as we are they felt like that was appropriate and they are okay with him continuing to follow-up here with [place] in that regard. With that being said they are also sending him to the vein specialist there to see about vein stripping and if that would be of benefit for him. Subsequently they also did not really address whether or not an ultrasound of the muscle area to see if there is anything that needs to be addressed here would be appropriate or not. For that reason I discussed this with him last week I think we  may proceed down that road at this point. 06/21/2019 upon evaluation today patient's wound actually appears to be doing slightly better compared to previous evaluations. I do believe that he has made a difference with regard to the progression here with the use of oral steroids. Again in the past has been the only thing that is really calm things down. He does tell me that from [facility] is gotten a good news from there that there are no further vein stripping that is necessary at this point. I do not have that available for review today although the patient did relay this to me. He also did obtain and have the ultrasound of the wound completed which I did sign off on today. It does appear that there is no fluid collection under the muscle this is likely then just edematous tissue in general. That is also good news. Overall I still believe the inflammation is the main issue here. He did inquire about the possibility of a wound VAC again with the muscle protruding like it is I am not really sure whether the wound VAC is necessarily ideal or not. That is something we will have to consider although I do believe he may need compression wrapping to  try to help with edema control which could potentially be of benefit. 06/28/2019 on evaluation today patient appears to be doing slightly better measurement wise although this is not terribly smaller he least seems to be trending towards that direction. With that being said he still seems to have purulent drainage noted in the wound bed at this time. He has been on Levaquin followed by Cipro over the past month. Unfortunately he still seems to have some issues with active infection at this time. I did perform a culture last week in order to evaluate and see if indeed there was still anything going on. Subsequently the culture did come back showing Pseudomonas which is consistent with the drainage has been having which is blue-green in color. He also has had an odor that again was somewhat consistent with Pseudomonas as well. Long story short it appears that the culture showed an intermediate finding with regard to how well the Cipro will work for the Pseudomonas infection. Subsequently being that he does not seem to be clearing up and at best what we are doing is just keeping this at [place] I think he may need to see infectious disease to discuss IV antibiotic options. [name], [name] ([id number]) 07/05/2019 upon evaluation today patient appears to be doing okay in regard to his leg ulcer. He has been tolerating the dressing changes at this point without complication. Fortunately there is no signs of active infection at this time which is good news. No fevers, chills, nausea, vomiting, or diarrhea. With that being said he does have an appointment with infectious disease tomorrow and his primary care on Wednesday. Again the reason for the infectious disease referral was due to the fact that he did not seem to be fully resolving with the use of oral antibiotics and therefore we were thinking that IV antibiotic therapy may be necessary secondary to the fact that there was an intermediate finding for  how effective the Cipro may be. Nonetheless again he has been having a lot of purulent and even green drainage. Fortunately right now that seems to have calmed down over the past week with the reinitiation of the oral antibiotic. Nonetheless we will see what Dr. Megan Salon has to say. 07/12/2019 upon evaluation today patient appears to be  doing about the same at this point in regard to his left lower extremity ulcer. Fortunately there is no signs of active infection at this time which is good news I do believe the Levaquin has been beneficial I did review Dr. Hale Bogus note and to be honest I agree that the patient's leg does appear to be doing better currently. What we found in the past as he does not seem to really completely resolve he will stop the antibiotic and then subsequently things will revert back to having issues with blue-green drainage, increased pain, and overall worsening in general. Obviously that is the reason I sent him back to infectious disease. 07/19/2019 upon evaluation today patient appears to be doing roughly the same in size there is really no dramatic improvement. He has started back on the Levaquin at this point and though he seems to be doing okay he did still have a lot of blue/green drainage noted on evaluation today unfortunately. I think that this is still indicative more likely of a Pseudomonas infection as previously noted and again he does see Dr. Megan Salon in just a couple of days. I do not know that were really able to effectively clear this with just oral antibiotics alone based on what I am seeing currently. Nonetheless we are still continue to try to manage as best we can with regard to the patient and his wound. I do think the wrap was helpful in decreasing the edema which is excellent news. No fevers, chills, nausea, vomiting, or diarrhea. 07/26/2019 upon evaluation today patient appears to be doing slightly better with regard to the overall appearance of the muscle  there is no dark discoloration centrally. Fortunately there is no signs of active infection at this time. No fevers, chills, nausea, vomiting, or diarrhea. Patient's wound bed currently the patient did have an appointment with Dr. Megan Salon at infectious disease last week. With that being said Dr. Megan Salon the patient states was still somewhat hesitant about put him on any IV antibiotics he wanted [place] to repeat cultures today and then see where things go going forward. He does look like Dr. Megan Salon because of some improvement the patient did have with the Levaquin wanted [place] to see about repeating cultures. If it indeed grows the Pseudomonas again then he recommended a possibility of considering a PICC line placement and IV antibiotic therapy. He plans to see the patient back in 1 to 2 weeks. 08/02/2019 upon evaluation today patient appears to be doing poorly with regard to his left lower extremity. We did get the results of his culture back it shows that he is still showing evidence of Pseudomonas which is consistent with the purulent/blue-green drainage that he has currently. Subsequently the culture also shows that he now is showing resistance to the oral fluoroquinolones which is unfortunate as that was really the only thing to treat the infection prior. I do believe that he is looking like this is going require IV antibiotic therapy to get this under control. Fortunately there is no signs of systemic infection at this time which is good news. The patient does see Dr. Megan Salon tomorrow. 08/09/2019 upon evaluation today patient appears to be doing better with regard to his left lower extremity ulcer in regard to the overall appearance. He is currently on IV antibiotic therapy. As ordered by Dr. Megan Salon. Currently the patient is on ceftazidime which she is going to take for the next 2 weeks and then follow-up for 4 to 5-week appointment with Dr. Megan Salon.  The patient started this this past Friday  symptoms have not for a total of 3 days currently in full. 08/16/2019 upon evaluation today patient's wound actually does show muscle in the base of the wound but in general does appear to be much better as far as the overall evidence of infection is concerned. In fact I feel like this is for the most part cleared up he still on the IV antibiotics he has not completed the full course yet but I think he is doing much better which is excellent news. 08/23/2019 upon evaluation today patient appears to be doing about the same with regard to his wound at this point. He tells me that he still has pain unfortunately. Fortunately there is no evidence of systemic infection at this time which is great news. There is significant muscle protrusion. 09/13/19 upon evaluation today patient appears to be doing about the same in regard to his leg unfortunately. He still has a lot of drainage coming from the ulceration there is still muscle exposed. With that being said the patient's last wound culture still showed an intermediate finding with regard to the Pseudomonas he still having the bluish/green drainage as well. Overall I do not know that the wound has completely cleared of infection at this point. Fortunately there is no signs of active infection systemically at this point which is good news. 09/20/2019 upon evaluation today patient's wound actually appears to be doing about the same based on what I am seeing currently. I do not see any signs of systemic infection he still does have evidence of some local infection and drainage. He did see Dr. Megan Salon last week and Dr. Megan Salon states that he probably does need a different IV antibiotic although he does not want to put him on this until the patient begins the Remicade infusion which is actually scheduled for about 10 days out from today on 13 May. Following that time Dr. Megan Salon is good to see him back and then will evaluate the feasibility of starting him on the  IV antibiotic therapy once again at that point. I do not disagree with this plan I do believe as Dr. Megan Salon stated in his note that I reviewed today that the patient's issue is multifactorial with the pyoderma being 1 aspect of this that were hoping the Remicade will be helpful for her. In the meantime I think the gentamicin is, helping to keep things under decent okay control in regard to the ulcer. 09/27/2019 upon evaluation today patient appears to be doing about the same with regard to his wound still there is a lot of muscle exposure though he does have some hyper granulation tissue noted around the edge and actually some granulation tissue starting to form over the muscle which is actually good news. Fortunately there is no evidence of active infection which is also good news. His pain is less at this point. 5/21; this is a patient I have not seen in a long time. 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 He has a large wound on the left lateral leg with protruding muscle. He comes in the clinic today showing the same area on his left medial ankle. He says there is been a spot there for some time although we have not previously defined this. Today he has a clearly defined area with slight amount of skin breakdown surrounded by raised areas with a purplish hue in color. This is not painful he says it is irritated.  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 He still has muscle protruding from the lateral portion of his left leg but fortunately the new area that they were concerned about at his last visit does not appear to have opened at this point. He is currently on Remicade infusions and seems to be doing better in my opinion in fact the wound itself seems to be overall much better. The purplish discoloration that he did have seems to have resolved  and I think that is a good sign that hopefully the Remicade is doing its job. He does have some biofilm noted over the surface of the wound. 11/01/2019 on evaluation today patient's wound actually appears to be doing excellent at this time. Fortunately there is no evidence of active infection and overall I feel like he is making great progress. The Remicade seems to be due excellent job in my opinion. [name], [name] ([id number]) 11/08/19 evaluation today vision actually appears to be doing quite well with regard to his weight ulcer. He's been tolerating dressing changes without complication. Fortunately there is no evidence of infection. No fevers, chills, nausea, or vomiting noted at this time. Overall states that is having more itching than pain which is actually a good sign in my opinion. 12/13/2019 upon evaluation today patient appears to be doing well today with regard to his wound. He has been tolerating the dressing changes without complication. Fortunately there is no sign of active infection at this time. No fevers, chills, nausea, vomiting, or diarrhea. Overall I feel like the infusion therapy has been very beneficial for him. 01/06/2020 on evaluation today patient appears to be doing well with regard to his wound. This is measuring smaller and actually looks to be doing better. Fortunately there is no signs of active infection at this point. No fevers, chills, nausea, vomiting, or diarrhea. With that being said he does still have the blue-green drainage but this does not seem to be causing any significant issues currently. He has been using the gentamicin that does seem to be keeping things under decent control at this point. He goes later this morning for his next infusion therapy for the pyoderma which seems to also be very beneficial. 02/07/2020 on evaluation today patient appears to be doing about the same in regard to his wounds currently. Fortunately there is no signs of active infection  systemically he does still have evidence of local infection still using gentamicin. He also is showing some signs of improvement albeit slowly I do feel like we are making some progress here. 02/21/2020 upon evaluation today patient appears to be making some signs of improvement the wound is measuring a little bit smaller which is great news and overall I am very pleased with where he stands currently. He is going to be having infusion therapy treatment on the 15th of this month. Fortunately there is no signs of active infection at this time. 03/13/2020 I do believe patient's wound is actually showing some signs of improvement here which is great news. He has continue with the infusion therapy through rheumatology/dermatology at [facility]. That does seem to be beneficial. I still think he gets as much benefit from this as he did from the prednisone initially but nonetheless obviously this is less harsh on his body that the prednisone as far as they are concerned. 03/31/2020 on evaluation today patient's wound actually showing signs of some pretty good improvement in regard  to the overall appearance of the wound bed. There is still muscle exposed though he does have some epithelial growth around the edges of the wound. Fortunately there is no signs of active infection at this time. No fevers, chills, nausea, vomiting, or diarrhea. 04/24/2020 upon evaluation today patient appears to be doing about the same in regard to his leg ulcer. He has been tolerating the dressing changes without complication. Fortunately there is no signs of active infection at this time. No fevers, chills, nausea, vomiting, or diarrhea. With that being said he still has a lot of irritation from the bandaging around the edges of the wound. We did discuss today the possibility of a referral to plastic surgery. 05/22/2020 on evaluation today patient appears to be doing well with regard to his wounds all things considered. He has not been able  to get the Chantix apparently there is a recall nurse that I was unaware of put out by [employer] involuntarily. Nonetheless for now I am and I have to do some research into what may be the best option for him to help with quitting in regard to smoking and we discussed that today. 06/26/2020 upon evaluation today patient appears to be doing well with regard to his wound from the standpoint of infection I do not see any signs of infection at this point. With that being said unfortunately he is still continuing to have issues with muscle exposure and again he is not having a whole lot of new skin growth unfortunately. There does not appear to be any signs of active infection at this time. No fevers, chills, nausea, vomiting, or diarrhea. 07/10/2020 upon evaluation today patient appears to be doing a little bit more poorly currently compared to where he was previous. I am concerned currently about an active infection that may be getting worse especially in light of the increased size and tenderness of the wound bed. No fevers, chills, nausea, vomiting, or diarrhea. 07/24/2020 upon evaluation today patient appears to be doing poorly in regard to his leg ulcer. He has been tolerating the dressing changes without complication but unfortunately is having a lot of discomfort. 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 Subsequently I think he is going require possibly IV antibiotics to get this under control. I am very concerned about the severity of his infection and the amount of discomfort he is having. 07/31/2020 upon evaluation today patient appears to be doing about the same in regard to his leg wound. He did see Dr. Megan Salon and Dr. Megan Salon is actually going to start him on IV antibiotics. He goes for the PICC line tomorrow. With that being said there do not have that run for 2 weeks and then see how things are doing and depending on how he  is progressing they may extend that a little longer. Nonetheless I am glad this is getting ready to be in place and definitely feel it may help the patient. In the meantime is been using mainly triamcinolone to the wound bed has an anti-inflammatory. 08/07/2020 on evaluation today patient appears to be doing well with regard to his wound compared even last week. In the interim he has gotten the PICC line placed and overall this seems to be doing excellent. There does not appear to be any evidence of infection which is great news systemically although locally of course has had the infection this appears to be improving with the use of the antibiotics. 08/14/2020 upon  evaluation today patient's wound actually showing signs of excellent improvement. Overall the irritation has significantly improved the drainage is back down to more of a normal level and his pain is really pretty much nonexistent compared to what it was. Obviously I think that this is significantly improved secondary to the IV antibiotic therapy which has made all the difference in the world. Again he had a resistant form of Pseudomonas for which oral antibiotics just was not cutting it. Nonetheless I do think that still we need to consider the possibility of a surgical closure for this wound is been open so long and to be honest with muscle exposed I think this can be very hard to get this to close outside of this although definitely were still working to try to do what we can in that regard. 08/21/2020 upon evaluation today patient appears to be doing very well with regard to his wounds on the left lateral lower extremity/calf area. Fortunately there does not appear to be signs of active infection which is great news and overall very pleased with where things stand today. He is actually wrapping up his treatment with IV antibiotics tomorrow. After that we will see where things go from there. 08/28/2020 upon evaluation today patient appears  to be doing decently well with regard to his leg ulcer. There does not appear to be any signs of active infection which is great news and overall very pleased with where things stand today. No fevers, chills, nausea, vomiting, or diarrhea. 09/18/2020 upon evaluation today patient appears to be doing well with regard to his infection which I feel like is better. Unfortunately he is not doing as well with regard to the overall size of the wound which is not nearly as good at this point. I feel like that he may be having an issue here with the pyoderma being somewhat out of control. I think that he may benefit from potentially going back and talking to the dermatologist about [name], [name] ([id number]) what to do from the pyoderma standpoint. I am not certain if the infusions are helping nearly as much is what the prednisone did in the past. 10/02/2020 upon evaluation today patient appears to be doing well with regard to his leg ulcer. He did go to the [facility]iatric nurse. Unfortunately they feel like there is a 10% chance that most that he would be able to heal and that the skin graft would take. Obviously this has led him to not be able to go down that path as far as treatment is concerned. Nonetheless he does seem to be doing a little bit better with the prednisone that I gave him last time. I think that he may need to discuss with dermatology the possibility of long-term prednisone as that seems to be what is most helpful for him to be perfectly honest. I am not sure the Remicade is really doing the job. 10/17/2020 upon evaluation today patient appears to be doing a little better in regard to his wound. In fact the case has been since we did the prednisone on May 2 for him that we have noticed a little bit of improvement each time we have seen a size wise as well as appearance wise as well as pain wise. I think the prednisone has had a greater effect then the infusion therapy has to be perfectly honest. With  that being said the patient also feels significantly better compared to what he was previous. All of this is good news but  nonetheless I am still concerned about the fact that again we are really not set up to long-term manage him as far as prednisone is concerned. Obviously there are things that you need to be watched I completely understand the risk of prednisone usage as well. 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 With all that being said I do believe that we can give him another round of the prednisone which she is requesting today because of the improvement that he seen since we did that first round. 10/30/2020 upon evaluation today patient's wound actually is showing signs of doing quite well. There does not appear to be any evidence of infection which is great news and overall very pleased with where things stand today. No fevers, chills, nausea, vomiting, or diarrhea. He tells me that the prednisone still has seem to have helped he wonders if we can extend that for just a little bit longer. He did not have the appointment with a dermatologist although he did have an infusion appointment last Friday. That was at [facility]. With that being said he tells me he could not do both that as well as the appointment with the physician on the same day therefore that is can have to be rescheduled. I really want to see if there is anything they feel like that could be done differently to try to help this out as I am not really certain that the infusions are helping significantly here. 11/13/2020 upon evaluation today patient unfortunately appears to be doing somewhat poorly in regard to his wound I feel like this is actually worsening from the standpoint of the pyoderma spreading. I still feel like that he may need something different as far as trying to manage this going forward. Again we did the prednisone unfortunately his blood sugars are not doing so well  because of this. Nonetheless I believe that the patient likely needs to try topical steroid. We have done triamcinolone for a while I think going with something stronger such as clobetasol could be beneficial again this is not something I do lightly I discussed this with the patient that again this does not normally put underneath an occlusive dressing. Nonetheless I think a thin film as such could help with some of the stronger anti-inflammatory effects. We discussed this today. He would like to try to give this a trial for the next couple weeks. I definitely think that is something that we can do. Evaluate7/03/2021 and today patient's wound bed actually showed signs of doing really about the same. There was a little expansion of the size of the wound and that leading edge that we done looking out although the clobetasol does seem to have slowed this down a bit in my opinion. There is just 1 small area that still seems to be progressing based on what I see. Nonetheless I am concerned about the fact this does not seem to be improving if anything seems to be doing a little bit worse. I do not know that the infusions are really helping him much as next infusion is August 5 his appointment with dermatology is July 25. Either way I really think that we need to have a conversation potentially about this and I am actually going to see if I can talk with Dr. Lillia Carmel in order to see where things stand as well. 12/11/2020 upon evaluation today patient appears to be doing worse in regard to his leg ulcer. Unfortunately  I just do not think this is making the progress that I would like to see at this point. Honestly he does have an appointment with dermatology and this is in 2 days. I am wondering what they may have to offer to help with this. Right now what I am seeing is that he is continuing to show signs of worsening little by little. Obviously that is not great at all. Is the exact opposite of what we are  looking for. 12/18/2020 upon evaluation today patient appears to be doing a little better in regard to his wound. The dermatologist actually did do some steroid injections into the wound which does seem to have been beneficial in my opinion. That was on the 25th already this looks a little better to me than last time I saw him. With that being said we did do a culture and this did show that he has Staph aureus noted in abundance in the wound. With that being said I do think that getting him on an oral antibiotic would be appropriate as well. Also think we can compression wrap and this will make a difference as well. 12/28/2020 upon evaluation today patient's wound is actually showing signs of doing much better. I do believe the compression wrap is helping he has a lot of drainage but to be honest I think that the compression is helping to some degree in this regard as well as not draining through which is also good news. No fevers, chills, nausea, vomiting, or diarrhea. 01/04/2021 upon evaluation today patient appears to be doing well with regard to his wound. Overall things seem to be doing quite well. He did have a little bit of reaction to the CarboFlex Sorbact he will be using that any longer. With that being said he is controlled as far as the drainage is concerned overall and seems to be doing quite well. I do not see any signs of active infection at this time which is great news. No fevers, chills, nausea, vomiting, or diarrhea. 01/11/2021 upon evaluation today patient appears to be doing well with regard to his wounds. He has been tolerating the dressing changes without complication. Fortunately there does not appear to be any signs of active infection at this time which is great news. Overall I am extremely pleased with where we stand currently. No fevers, chills, nausea, vomiting, or diarrhea. Where using clobetasol in the wound bed he has a lot of new skin growth which is awesome as  well. 01/18/2021 upon evaluation today patient appears to be doing very well in regard to his leg ulcer. He has been tolerating the dressing changes without complication. Fortunately there does not appear to be any signs of active infection which is great news. In general I think that he is making excellent progress 01/25/2021 upon evaluation today patient appears to be doing well with regard to his wound on the leg. I am actually extremely pleased with where things stand today. There does not appear to be any signs of active infection which is great news and overall I think that we are definitely headed in the appropriate direction based on what I am seeing currently. There does not appear to be any signs of active infection also excellent news. 02/06/2021 upon evaluation today patient appears to be doing well with regard to his wound. Overall visually this is showing signs of significant improvement which is great news. I do not see any signs of active infection systemically which is great even locally  I do not think that we are seeing any major complications here. We did do fluorescence imaging with the [employer] today. The patient does have some odor and drainage noted and again this is something that I think would benefit him to probably come more frequently for nurse visits. 02/19/2021 upon evaluation today patient actually appears to be doing quite well in regard to his wound. He has been tolerating the dressing Koskela, Cheryl ([id number]) changes without complication and overall I think that this is making excellent progress. I do not see any evidence of active infection at this point which is great news as well. No fevers, chills, nausea, vomiting, or diarrhea. 10/10; wound is made nice progress healthy granulation with a nice rim of epithelialization which seems to be expanding even from last week he has a deeper area in the inferior part of the more distal part of the wound with not quite as  healthy as surface. This area will need to be followed. Using clobetasol and Hydrofera Blue 03/05/2021 upon evaluation today patient appears to be doing very well in regard to his leg ulcer. He has been tolerating dressing changes without complication. Fortunately there does not appear to be any signs of active infection which is great news and overall I am extremely pleased with where we stand currently. 03/12/2021 upon evaluation today patient appears to be doing well with regard to his wound in fact this is extremely extremely good based on what we are seeing today there does not appear to be any signs of active infection and overall I think that he is doing awesome from the standpoint of healing in general. I am extremely pleased with how things seem to be progressing with regard to this pyoderma. Clobetasol has done wonders for him. I think the compression wrapping has also been of great benefit. 03/19/2021 upon evaluation today patient appears to be doing well with regard to his wound. He is tolerating the dressing changes without complication. In fact I feel like that he is actually making excellent progress at this point based on what I am seeing. No fevers, chills, nausea, vomiting, or diarrhea. 03/26/2021 upon evaluation today patient appears to be doing well with regard to his wound. This again is measuring smaller and looking better. Again the progress is slow but nonetheless continual with what we have been seeing. I do believe that the current plan is doing awesome for him. 04/09/2021 upon evaluation today patient appears to be doing well with regard to his leg ulcer. This is showing signs of excellent improvement the muscle is completely closed over and there does not appear to be any evidence of inflammation at this point his drainage is significantly improved. Overall I think that he would be a good candidate for looking into a skin substitute at this point as well. We will get a look  into some approvals in that regard. Potentially TheraSkin as well as Apligraf could both be considered just depending on insurance coverage. Electronic Signature(s) Signed: 04/09/2021 10:12:41 AM By: Worthy Keeler PA-C Entered By: Worthy Keeler on 04/09/2021 10:12:41 [name], [name] ([id number]) -------------------------------------------------------------------------------- Physical Exam Details Patient Name: [name] Date of Service: 04/09/2021 8:15 AM Medical Record Number: [id number] Patient Account Number: [id number] Date of Birth/Sex: 1981/03/17 (40 y.o. M) Treating RN: [name] Primary Care Provider: [name] Other Clinician: Referring Provider: [name] Treating Provider/Extender: [name] Cliche in Treatment: 77 Constitutional Obese and well-hydrated in no acute distress. Respiratory normal breathing without difficulty. Psychiatric this  patient is able to make decisions and demonstrates good insight into disease process. Alert and Oriented x 3. pleasant and cooperative. Notes Upon inspection patient's wound bed actually showed signs of excellent granulation epithelization at this point. I am actually extremely pleased with where we stand. I think that he could benefit from either TheraSkin or Apligraf as far as trying to improve the overall appearance and healing prospects here. I think that will help to speed things up I especially think the TheraSkin could be of benefit. Electronic Signature(s) Signed: 04/09/2021 10:13:40 AM By: Worthy Keeler PA-C Entered By: Worthy Keeler on 04/09/2021 10:13:39 [name], [name] ([id number]) -------------------------------------------------------------------------------- Physician Orders Details Patient Name: [name] Date of Service: 04/09/2021 8:15 AM Medical Record Number: [id number] Patient Account Number: [id number] Date of Birth/Sex: Jul 22, 1980 (40 y.o. M) Treating RN: [name] Primary Care Provider:  [name] Other Clinician: Referring Provider: [name] Treating Provider/Extender: [name] Cliche in Treatment: 226 Verbal / Phone Orders: No Diagnosis Coding ICD-10 Coding Code Description I87.2 Venous insufficiency (chronic) (peripheral) L97.222 Non-pressure chronic ulcer of left calf with fat layer exposed E11.622 Type 2 diabetes mellitus with other skin ulcer L88 Pyoderma gangrenosum F17.208 Nicotine dependence, unspecified, with other nicotine-induced disorders Follow-up Appointments o Return Appointment in 1 week. o Nurse Visit as needed - twice a week Bathing/ Shower/ Hygiene o Clean wound with Normal Saline or wound cleanser. o Other: - Apply dakins soaked gauze on wound after cleansing wound IN OFFICE ONLY Edema Control - Lymphedema / Segmental Compressive Device / Other o Optional: One layer of unna paste to top of compression wrap (to act as an anchor). o Elevate, Exercise Daily and Avoid Standing for Long Periods of Time. o Elevate legs to the level of the heart and pump ankles as often as possible o Elevate leg(s) parallel to the floor when sitting. Wound Treatment Wound #1 - Lower Leg Wound Laterality: Left, Lateral Cleanser: Wound Cleanser 3 x Per Week/30 Days Discharge Instructions: Wash your hands with soap and water. Remove old dressing, discard into plastic bag and place into trash. Cleanse the wound with Wound Cleanser prior to applying a clean dressing using gauze sponges, not tissues or cotton balls. Do not scrub or use excessive force. Pat dry using gauze sponges, not tissue or cotton balls. Peri-Wound Care: Moisturizing Lotion 3 x Per Week/30 Days Discharge Instructions: Suggestions: Theraderm, Eucerin, Cetaphil, or patient preference. Topical: Clobetasol Propionate ointment 0.05%, 60 (g) tube 3 x Per Week/30 Days Discharge Instructions: Apply to 7 o'clock depth Primary Dressing: Hydrofera Blue Ready Transfer Foam, 4x5  (in/in) 3 x Per Week/30 Days Discharge Instructions: Apply Hydrofera Blue Ready to wound bed as directed Secondary Dressing: Xtrasorb Medium 4x5 (in/in) 3 x Per Week/30 Days Discharge Instructions: Apply to wound as directed. Do not cut. Compression Wrap: Medichoice 4 layer Compression System, 35-40 mmHG (Generic) 3 x Per Week/30 Days Discharge Instructions: Apply multi-layer wrap as directed. Electronic Signature(s) Signed: 04/09/2021 5:26:28 PM By: Worthy Keeler PA-C Signed: 04/10/2021 4:45:38 PM By: [name] [name], RN, [name], [name], [name] Entered By: [name], [name], RN, [name], [name] on 04/09/2021 09:01:34 KUPER, RENNELS ([id number]) -------------------------------------------------------------------------------- Problem List Details Patient Name: [name] Date of Service: 04/09/2021 8:15 AM Medical Record Number: [id number] Patient Account Number: [id number] Date of Birth/Sex: 10-03-1980 (40 y.o. M) Treating RN: [name] Primary Care Provider: [name] Other Clinician: Referring Provider: [name] Treating Provider/Extender: [name] Cliche in Treatment: 226 Active Problems ICD-10 Encounter Code Description Active Date MDM Diagnosis I87.2  Venous insufficiency (chronic) (peripheral) 12/04/2016 No Yes L97.222 Non-pressure chronic ulcer of left calf with fat layer exposed 12/04/2016 No Yes E11.622 Type 2 diabetes mellitus with other skin ulcer 04/09/2021 No Yes L88 Pyoderma gangrenosum 03/26/2017 No Yes F17.208 Nicotine dependence, unspecified, with other nicotine-induced disorders 04/24/2020 No Yes Inactive Problems ICD-10 Code Description Active Date Inactive Date L97.213 Non-pressure chronic ulcer of right calf with necrosis of muscle 04/02/2017 04/02/2017 Resolved Problems ICD-10 Code Description Active Date Resolved Date L97.321 Non-pressure chronic ulcer of left ankle limited to breakdown of skin 10/08/2019 10/08/2019 L03.116 Cellulitis of left lower limb 05/24/2019  05/24/2019 Electronic Signature(s) Signed: 04/09/2021 8:50:56 AM By: Worthy Keeler PA-C Entered By: Worthy Keeler on 04/09/2021 08:50:56 Apt, Yonah ([id number]) -------------------------------------------------------------------------------- Progress Note Details Patient Name: [name] Date of Service: 04/09/2021 8:15 AM Medical Record Number: [id number] Patient Account Number: [id number] Date of Birth/Sex: August 18, 1980 (40 y.o. M) Treating RN: [name] Primary Care Provider: [name] Other Clinician: Referring Provider: [name] Treating Provider/Extender: [name] Cliche in Treatment: 226 Subjective Chief Complaint Information obtained from Patient He is here in follow up evaluation for LLE pyoderma ulcer History of Present Illness (HPI) 12/04/16; 40 year old man who comes into the clinic today for review of a wound on the posterior left calf. He tells me that is been there for about a year. He is not a diabetic he does smoke half a pack per day. He was seen in the ER on 11/20/16 felt to have cellulitis around the wound and was given clindamycin. An x-ray did not show osteomyelitis. The patient initially tells me that he has a milk allergy that sets off a pruritic itching rash on his lower legs which she scratches incessantly and he thinks that's what may have set up the wound. He has been using various topical antibiotics and ointments without any effect. He works in a [employer] and is on his feet all day. He does not have a prior history of wounds however he does have the rash on both lower legs the right arm and the ventral aspect of his left arm. These are excoriations and clearly have had scratching however there are of macular looking areas on both legs including a substantial larger area on the right leg. This does not have an underlying open area. There is no blistering. The patient tells me that 2 years ago in [place] in response to the rash on his legs  he saw a dermatologist who told him he had a condition which may be pyoderma gangrenosum although I may be putting words into his mouth. He seemed to recognize this. On further questioning he admits to a 5 year history of quiesced. ulcerative colitis. He is not in any treatment for this. He's had no recent travel 12/11/16; the patient arrives today with his wound and roughly the same condition we've been using silver alginate this is a deep punched out wound with some surrounding erythema but no tenderness. Biopsy I did did not show confirmed pyoderma gangrenosum suggested nonspecific inflammation and vasculitis but does not provide an actual description of what was seen by the pathologist. I'm really not able to understand this We have also received information from the patient's dermatologist in [place] notes from April 2016. This was a doctor Agarwal-antal. 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 He was prescribed topical steroid high potency under occlusion which helped but at this point the patient did not have a deep punched out wound. 12/18/16; the patient's  wound is larger in terms of surface area however this surface looks better and there is less depth. The surrounding erythema also is better. The patient states that the wrap we put on came off 2 days ago when he has been using his compression stockings. He we are in the process of getting a dermatology consult. 12/26/16 on evaluation today patient's left lower extremity wound shows evidence of infection with surrounding erythema noted. He has been tolerating the dressing changes but states that he has noted more discomfort. There is a larger area of erythema surrounding the wound. No fevers, chills, nausea, or vomiting noted at this time. With that being said the wound still does have slough covering the surface. He is not allergic to any medication that he is aware of at this point. In regard to his right lower extremity he had  several regions that are erythematous and pruritic he wonders if there's anything we can do to help that. 01/02/17 I reviewed patient's wound culture which was obtained his visit last week. He was placed on doxycycline at that point. Unfortunately that does not appear to be an antibiotic that would likely help with the situation however the pseudomonas noted on culture is sensitive to Cipro. Also unfortunately patient's wound seems to have a large compared to last week's evaluation. Not severely so but there are definitely increased measurements in general. He is continuing to have discomfort as well he writes this to be a seven out of 10. In fact he would prefer me not to perform any debridement today due to the fact that he is having discomfort and considering he has an active infection on the little reluctant to do so anyway. No fevers, chills, nausea, or vomiting noted at this time. 01/08/17; patient seems dermatology on September 5. I suspect dermatology will want the slides from the biopsy I did sent to their pathologist. I'm not sure if there is a way we can expedite that. In any case the culture I did before I left on vacation 3 weeks ago showed Pseudomonas he was given 10 days of Cipro and per her description of her intake nurses is actually somewhat better this week although the wound is quite a bit bigger than I remember the last time I saw this. He still has 3 more days of Cipro 01/21/17; dermatology appointment tomorrow. He has completed the ciprofloxacin for Pseudomonas. Surface of the wound looks better however he is had some deterioration in the lesions on his right leg. Meantime the left lateral leg wound we will continue with sample 01/29/17; patient had his dermatology appointment but I can't yet see that note. He is completed his antibiotics. The wound is more superficial but considerably larger in circumferential area than when he came in. This is in his left lateral calf. He also has  swollen erythematous areas with superficial wounds on the right leg and small papular areas on both arms. There apparently areas in her his upper thighs and buttocks I did not look at those. Dermatology biopsied the right leg. Hopefully will have their input next week. 02/05/17; patient went back to see his dermatologist who told him that he had a "scratching problem" as well as staph. He is now on a 30 day course of doxycycline and I believe she gave him triamcinolone cream to the right leg areas to help with the itching [not exactly sure but probably triamcinolone]. She apparently looked at the left lateral leg wound although this was not rebiopsied and  I think felt to be ultimately part of the same pathogenesis. He is using sample border foam and changing nevus himself. He now has a new open area on the right posterior leg which was his biopsy site I don't have any of the dermatology notes 02/12/17; we put the patient in compression last week with SANTYL to the wound on the left leg and the biopsy. Edema is much better and the depth of the wound is now at level of skin. Area is still the same Biopsy site on the right lateral leg we've also been using santyl with a border foam dressing and he is changing this himself. 02/19/17; Using silver alginate started last week to both the substantial left leg wound and the biopsy site on the right wound. He is tolerating compression well. Has a an appointment with his primary M.D. tomorrow wondering about diuretics although I'm wondering if the edema problem is actually lymphedema COLIN, ELLERS ([id number]) 02/26/17; the patient has been to see his primary doctor Dr. Jerrel Ivory at [facility] our primary care. She started him on Lasix 20 mg and this seems to have helped with the edema. However we are not making substantial change with the left lateral calf wound and inflammation. The biopsy site on the right leg also looks stable but not really all that  different. 03/12/17; the patient has been to see vein and vascular Dr. Lucky Cowboy. He has had venous reflux studies I have not reviewed these. I did get a call from his dermatology office. 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 They therefore recommended clobetasol and tetracycline i.e. aggressive treatment for possible pyoderma gangrenosum. 03/26/17; apparently the patient just had reflux studies not an appointment with Dr. dew. She arrives in clinic today having applied clobetasol for 2-3 weeks. He notes over the last 2-3 days excessive drainage having to change the dressing 3-4 times a day and also expanding erythema. 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 Lillia Carmel at [facility] dermatology locally who kindly saw him at my request. 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 My concern was the degree of erythema medially extending from his left leg wound which was either inflammation from pyoderma or cellulitis. I put him on Augmentin however culture of the wound showed Pseudomonas which is quinolone sensitive. I really don't believe he has cellulitis however in view of everything I will continue and give him a course of Cipro. He is also on doxycycline as an immune modulator for the pyoderma. In addition to his original wound on the left lateral leg with surrounding erythema he has a wound on the right posterior calf which was  an original biopsy site done by dermatology. 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 Saw Dr. Lillia Carmel yesterday. He has been using topical antibiotics to both wound areas his original wound on the left and the biopsies/pathergy area on the right.  There is definitely some improvement in the inflammation around the wound on the right although the patient states he has increasing sensitivity of the wounds. He is on prednisone 60 and doxycycline 1 as prescribed by Dr. Lillia Carmel. He is covering the topical antibiotic with gauze and putting this in his own compression stocks and changing this daily. He states that Dr. Lottie Rater did a culture of the left leg wound yesterday 05/07/17; pyoderma gangrenosum. The patient saw Dr. Lillia Carmel yesterday and has a follow-up with her in one month. He is still using topical antibiotics to both wounds although he can't recall exactly what type. He is still on prednisone 60 mg. Dr. Lillia Carmel stated that the doxycycline could stop if we were in agreement. 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 The right medial leg was induced by biopsy/pathergy. The area on the right is essentially healed. 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 The right medial leg has closed and remains closed. He is still on prednisone 60. He tells me he missed his last dermatology appointment with Dr. Lillia Carmel but will make another appointment. He reports that her blood sugar at a recent screen in [place] was high 200's. He was 180 today. 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 I think he is going to require still much longer prednisone perhaps another 3 months before attempting to taper. In the meantime his wound is a lot better.  Smaller. He is cleaning this off daily and applying topical antibiotics. When he was last in the clinic I thought about changing to [facility] and actually put in a couple of calls to dermatology although probably not during their business hours. 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 He continues on oral prednisone. He has been using triple antibiotic ointment. There is surface debris and we will transition to [facility] and have him return in 2 weeks. He has lost 30 pounds since his last appointment with lifestyle modification. He may benefit from topical steroid cream for treatment this can be considered at a later date. 08/22/17 on evaluation today patient appears to actually be doing rather well in regard to his left lateral lower extremity ulcer. He has actually been managed by Dr. Dellia Nims most recently. Patient is currently on oral steroids at this time. This seems to have been of benefit for him. Nonetheless his last visit was actually with Leah on 08/06/17. Currently he is not utilizing any topical steroid creams although this could be of benefit as well. No fevers, chills, nausea, or vomiting noted at this time. 09/05/17 on evaluation today patient appears to be doing better in regard to his left lateral lower extremity ulcer. He has been tolerating the dressing changes without complication. He is using Santyl with good effect. Overall I'm very pleased with how things are standing at this point. Patient likewise is happy that this is doing better. 09/19/17 on evaluation today patient actually appears to be doing rather well in regard to his left lateral lower extremity ulcer. 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 He's not having any significant pain. 10/03/17 on  evaluation today patient  appears to be doing excellent in regard to his lower extremity wound on the left secondary to Pyoderma gangrenosum. He has been tolerating the Santyl without complication and in general I feel like he's making good progress. 10/17/17 on evaluation today patient appears to be doing very well in regard to his left lateral lower surety ulcer. He has been tolerating the dressing changes without complication. There does not appear to be any evidence of infection he's alternating the Santyl and the triple antibiotic ointment every other day this seems to be doing well for him. 11/03/17 on evaluation today patient appears to be doing very well in regard to his left lateral lower extremity ulcer. He is been tolerating the dressing changes without complication which is good news. Fortunately there does not appear to be any evidence of infection which is also great news. Overall is doing excellent they are starting to taper down on the prednisone is down to 40 mg at this point it also started topical clobetasol for him. 11/17/17 on evaluation today patient appears to be doing well in regard to his left lateral lower surety ulcer. He's been tolerating the dressing changes without complication. He does note that he is having no pain, no excessive drainage or discharge, and overall he feels like things are going about how he would expect and hope they would. Overall he seems to have no evidence of infection at this time in my opinion which is good news. 12/04/17-He is seen in follow-up evaluation for right lateral lower extremity ulcer. He has been applying topical steroid cream. Today's measurement show slight increase in size. Over the next 2 weeks we will transition to every other day Santyl and steroid cream. He has been encouraged to monitor for changes and notify clinic with any concerns 12/15/17 on evaluation today patient's left lateral motion the ulcer and fortunately is doing worse again at this point. This  just since last week to this week has close to doubled in size according to the patient. I did not seeing last week's I do not have a visual to compare this to in our system was also down so we do not have all the charts and at this point. Nonetheless it does have me somewhat concerned in regard to the fact that again he was worried enough about it he has contact the dermatology that placed them back on the full strength, 50 mg a day of the prednisone that he was taken previous. He continues to alternate using clobetasol along with Santyl at this point. He is obviously somewhat frustrated. CLARENCE, COGSWELL ([id number]) 12/22/17 on evaluation today patient appears to be doing a little worse compared to last evaluation. Unfortunately the wound is a little deeper and slightly larger than the last week's evaluation. With that being said he has made some progress in regard to the irritation surrounding at this time unfortunately despite that progress that's been made he still has a significant issue going on here. 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 For that reason I discussed with him today the possibility of perform a wound culture to ensure there's nothing overtly infected. 01/06/18 on evaluation today patient's wound is larger and deeper than previously evaluated. With that being said it did appear that his wound was infected after my last evaluation with him. Subsequently I did end up prescribing  a prescription for Bactrim DS which she has been taking and having no complication with. Fortunately there does not appear to be any evidence of infection at this point in time as far as anything spreading, no want to touch, and overall I feel like things are showing signs of improvement. 01/13/18 on evaluation today patient appears to be even a little larger  and deeper than last time. There still muscle exposed in the base of the wound. Nonetheless he does appear to be less erythematous I do believe inflammation is calming down also believe the infection looks like it's probably resolved at this time based on what I'm seeing. No fevers, chills, nausea, or vomiting noted at this time. 01/30/18 on evaluation today patient actually appears to visually look better for the most part. Unfortunately those visually this looks better he does seem to potentially have what may be an abscess in the muscle that has been noted in the central portion of the wound. This is the first time that I have noted what appears to be fluctuance in the central portion of the muscle. With that being said I'm somewhat more concerned about the fact that this might indicate an abscess formation at this location. I do believe that an ultrasound would be appropriate. This is likely something we need to try to do as soon as possible. He has been switch to mupirocin ointment and he is no longer using the steroid ointment as prescribed by dermatology he sees them again next week he's been decreased from 60 to 40 mg of prednisone. 03/09/18 on evaluation today patient actually appears to be doing a little better compared to last time I saw him. There's not as much erythema surrounding the wound itself. He I did review his most recent infectious disease note which was dated 02/24/18. He saw Dr. Michel Bickers in [name]. 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 Patient is now off of antibiotics and they are continually observing this. There seems to be no change in the past two weeks in my pinion based on what the patient says and what I see today compared to what Dr. Megan Salon likely saw two weeks ago. No fevers, chills, nausea, or vomiting noted at this time. 03/23/18 on evaluation today patient's wound  actually appears to be showing signs of improvement which is good news. He is currently still on the Dapsone. He is also working on tapering the prednisone to get off of this and Dr. Lottie Rater is working with him in this regard. Nonetheless overall I feel like the wound is doing well it does appear based on the infectious disease note that I reviewed from Dr. 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 I did also review the results of his ultrasound of left lower extremity which revealed there was a dentist tissue in the base of the wound without an abscess noted. 04/06/18 on evaluation today the patient's left lateral lower extremity ulcer actually appears to be doing fairly well which is excellent news. There does not appear to be any evidence of infection at this time which is also great news. Overall he still does have a significantly large ulceration although little by little he seems to be making progress. He is down to 10 mg a day of the  prednisone. 04/20/18 on evaluation today patient actually appears to be doing excellent at this time in regard to his left lower extremity ulcer. He's making signs of good progress unfortunately this is taking much longer than we would really like to see but nonetheless he is making progress. Fortunately there does not appear to be any evidence of infection at this time. No fevers, chills, nausea, or vomiting noted at this time. The patient has not been using the Santyl due to the cost he hadn't got in this field yet. He's mainly been using the antibiotic ointment topically. Subsequently he also tells me that he really has not been scrubbing in the shower I think this would be helpful again as I told him it doesn't have to be anything too aggressive to even make it believe just enough to keep it free of some of the loose slough and biofilm on the wound  surface. 05/11/18 on evaluation today patient's wound appears to be making slow but sure progress in regard to the left lateral lower extremity ulcer. He is been tolerating the dressing changes without complication. Fortunately there does not appear to be any evidence of infection at this time. He is still just using triple antibiotic ointment along with clobetasol occasionally over the area. He never got the Santyl and really does not seem to intend to in my pinion. 06/01/18 on evaluation today patient appears to be doing a little better in regard to his left lateral lower extremity ulcer. He states that overall he does not feel like he is doing as well with the Dapsone as he did with the prednisone. Nonetheless he sees his dermatologist later today and is gonna talk to them about the possibility of going back on the prednisone. Overall again I believe that the wound would be better if you would utilize Santyl but he really does not seem to be interested in going back to the [place] at this point. He has been using triple antibiotic ointment. 06/15/18 on evaluation today patient's wound actually appears to be doing about the same at this point. Fortunately there is no signs of infection at this time. He has made slight improvements although he continues to not really want to clean the wound bed at this point. He states that he just doesn't mess with it he doesn't want to cause any problems with everything else he has going on. He has been on medication, antibiotics as prescribed by his dermatologist, for a staff infection of his lower extremities which is really drying out now and looking much better he tells me. Fortunately there is no sign of overall infection. 06/29/18 on evaluation today patient appears to be doing well in regard to his left lateral lower surety ulcer all things considering. Fortunately his staff infection seems to be greatly improved compared to previous. He has no signs of  infection and this is drying up quite nicely. He is still the doxycycline for this is no longer on cental, Dapsone, or any of the other medications. His dermatologist has recommended possibility of an infusion but right now he does not want to proceed with that. 07/13/18 on evaluation today patient appears to be doing about the same in regard to his left lateral lower surety ulcer. Fortunately there's no signs of infection at this time which is great news. Unfortunately he still builds up a significant amount of Slough/biofilm of the surface of the wound he still is not really cleaning this as he should be appropriately. Again I'm  able to easily with saline and gauze remove the majority of this on the surface which if you would do this at home would likely be a dramatic improvement for him as far as getting the area to improve. Nonetheless overall I still feel like he is making progress is just very slow. I think Santyl will be of benefit for him as well. Still he has not gotten this as of this point. 07/27/18 on evaluation today patient actually appears to be doing little worse in regards of the erythema around the periwound region of the wound he also tells me that he's been having more drainage currently compared to what he was experiencing last time I saw him. He states not quite as bad as what he had because this was infected previously but nonetheless is still appears to be doing poorly. Fortunately there is no evidence of systemic infection at this point. The patient tells me that he is not going to be able to afford the Santyl. He is still waiting to hear about the infusion therapy with his dermatologist. Apparently she wants an updated colonoscopy first. INAKI, VANTINE ([id number]) 08/10/18 on evaluation today patient appears to be doing better in regard to his left lateral lower extremity ulcer. Fortunately he is showing signs of improvement in this regard he's actually been approved for Remicade  infusion's as well although this has not been scheduled as of yet. Fortunately there's no signs of active infection at this time in regard to the wound although he is having some issues with infection of the right lower extremity is been seen as dermatologist for this. Fortunately they are definitely still working with him trying to keep things under control. 09/07/18 on evaluation today patient is actually doing rather well in regard to his left lateral lower extremity ulcer. He notes these actually having some hair grow back on his extremity which is something he has not seen in years. He also tells me that the pain is really not giving them any trouble at this time which is also good news overall she is very pleased with the progress he's using a combination of the mupirocin along with the probate is all mixed. 09/21/18 on evaluation today patient actually appears to be doing fairly well all things considered in regard to his looks from the ulcer. He's been tolerating the dressing changes without complication. Fortunately there's no signs of active infection at this time which is good news he is still on all antibiotics or prevention of the staff infection. He has been on prednisone for time although he states it is gonna contact his dermatologist and see if she put them on a short course due to some irritation that he has going on currently. Fortunately there's no evidence of any overall worsening this is going very slow I think cental would be something that would be helpful for him although he states that $50 for tube is quite expensive. He therefore is not willing to get that at this point. 10/06/18 on evaluation today patient actually appears to be doing decently well in regard to his left lateral leg ulcer. He's been tolerating the dressing changes without complication. Fortunately there's no signs of active infection at this time. Overall I'm actually rather pleased with the progress he's making  although it's slow he doesn't show any signs of infection and he does seem to be making some improvement. I do believe that he may need a switch up and dressings to try to help this to  heal more appropriately and quickly. 10/19/18 on evaluation today patient actually appears to be doing better in regard to his left lateral lower extremity ulcer. This is shown signs of having much less Slough buildup at this point due to the fact he has been using the [employer]. Obviously this is very good news. The overall size of the wound is not dramatically smaller but again the appearance is. 11/02/18 on evaluation today patient actually appears to be doing quite well in regard to his lower [place] ulcer. A lot of the skin around the ulcer is actually somewhat irritating at this point this seems to be more due to the dressing causing irritation from the adhesive that anything else. Fortunately there is no signs of active infection at this time. 11/24/18 on evaluation today patient appears to be doing a little worse in regard to his overall appearance of his lower extremity ulcer. There's more erythema and warmth around the wound unfortunately. He is currently on doxycycline which he has been on for some time. With that being said I'm not sure that seems to be helping with what appears to possibly be an acute cellulitis with regard to his left lower extremity ulcer. No fevers, chills, nausea, or vomiting noted at this time. 12/08/18 on evaluation today patient's wounds actually appears to be doing significantly better compared to his last evaluation. He has been using Santyl along with alternating tripling about appointment as well as the steroid cream seems to be doing quite well and the wound is showing signs of improvement which is excellent news. Fortunately there's no evidence of infection and in fact his culture came back negative with only normal skin flora noted. 12/21/2018 upon evaluation today patient actually  appears to be doing excellent with regard to his ulcer. This is actually the best that I have seen it since have been helping to take care of him. It is both smaller as well as less slough noted on the surface of the wound and seems to be showing signs of good improvement with new skin growing from the edges. He has been using just the triamcinolone he does wonder if he can get a refill of that ointment today. 01/04/2019 upon evaluation today patient actually appears to be doing well with regard to his left lateral lower extremity ulcer. With that being said it does not appear to be that he is doing quite as well as last time as far as progression is concerned. There does not appear to be any signs of infection or significant irritation which is good news. With that being said I do believe that he may benefit from switching to a collagen based dressing based on how clean The wound appears. 01/18/2019 on evaluation today patient actually appears to be doing well with regard to his wound on the left lower extremity. He is not made a lot of progress compared to where we were previous but nonetheless does seem to be doing okay at this time which is good news. There is no signs of active infection which is also good news. My only concern currently is I do wish we can get him into utilizing the collagen dressing his insurance would not pay for the supplies that we ordered although it appears that he may be able to order this through his supply company that he typically utilizes. This is [employer]. Nonetheless he did try to order it during the office visit today and it appears this did go through. We will see if he  can get that it is a different brand but nonetheless he has collagen and I do think will be beneficial. 02/01/2019 on evaluation today patient actually appears to be doing a little worse today in regard to the overall size of his wounds. Fortunately there is no signs of active infection at this time.  That is visually. Nonetheless when this is happened before it was due to infection. For that reason were somewhat concerned about that this time as well. 02/08/2019 on evaluation today patient unfortunately appears to be doing slightly worse with regard to his wound upon evaluation today. Is measuring a little deeper and a little larger unfortunately. I am not really sure exactly what is causing this to enlarge he actually did see his dermatologist she is going to see about initiating Humira for him. Subsequently she also did do steroid injections into the wound itself in the periphery. Nonetheless still nonetheless he seems to be getting a little bit larger he is gone back to just using the steroid cream topically which I think is appropriate. I would say hold off on the collagen for the time being is definitely a good thing to do. Based on the culture results which we finally did get the final result back regarding it shows staph as the bacteria noted again that can be a normal skin bacteria based on the fact however he is having increased drainage and worsening of the wound measurement wise I would go ahead and place him on an antibiotic today I do believe for this. 02/15/2019 on evaluation today patient actually appears to be doing somewhat better in regard to his ulcer. There is no signs of worsening at this time I did review his culture results which showed evidence of Staphylococcus aureus but not MRSA. Again this could just be more related to the normal skin bacteria although he states the drainage has slowed down quite a bit he may have had a mild infection not just colonization. And was much smaller and then since around10/04/2019 on evaluation today patient appears to be doing unfortunately worse as far as the size of the wound. I really feel like that this is steadily getting larger again it had been doing excellent right at the beginning of September we have seen a steady increase in the  area of the wound it is almost 2-1/2 times the size it was on September 1. Obviously this is a bad trend this is not wanting to see. For that reason we went back to using just the topical triamcinolone cream which does seem to help with inflammation. I checked him for bacteria by way of culture and nothing showed positive there. I am considering giving him a short course of a tapering steroid [name], [name] ([id number]) today to see if that is can be beneficial for him. The patient is in agreement with giving that a try. 03/08/2019 on evaluation today patient appears to be doing very well in comparison to last evaluation with regard to his lower extremity ulcer. This is showing signs of less inflammation and actually measuring slightly smaller compared to last time every other week over the past month and a half he has been measuring larger larger larger. Nonetheless I do believe that the issue has been inflammation the prednisone does seem to have been beneficial for him which is good news. No fevers, chills, nausea, vomiting, or diarrhea. 03/22/2019 on evaluation today patient appears to be doing about the same with regard to his leg ulcer. He has  been tolerating the dressing changes without complication. With that being said the wound seems to be mostly arrested at its current size but really is not making any progress except for when we prescribed the prednisone. He did show some signs of dropping as far as the overall size of the wound during that interval week. Nonetheless this is something he is not on long-term at this point and unfortunately I think he is getting need either this or else the Humira which his dermatologist has discussed try to get approval for. With that being said he will be seeing his dermatologist on the 11th of this month that is November. 04/19/2019 on evaluation today patient appears to be doing really about the same the wound is measuring slightly larger compared  to last time I saw him. He has not been into the office since November 2 due to the fact that he unfortunately had Covid as that his entire family. He tells me that it was rough but they did pull-through and he seems to be doing much better. Fortunately there is no signs of active infection at this time. No fevers, chills, nausea, vomiting, or diarrhea. 05/10/2019 on evaluation today patient unfortunately appears to be doing significantly worse as compared to last time I saw him. He does tell me that he has had his first dose of Humira and actually is scheduled to get the next one in the upcoming week. With that being said he tells me also that in the past several days he has been having a lot of issues with green drainage she showed me a picture this is more blue-green in color. He is also been having issues with increased sloughy buildup and the wound does appear to be larger today. Obviously this is not the direction that we want everything to take based on the starting of his Humira. Nonetheless I think this is definitely a result of likely infection and to be honest I think this is probably Pseudomonas causing the infection based on what I am seeing. 05/24/2019 on evaluation today patient unfortunately appears to be doing significantly worse compared to his prior evaluation with me 2 weeks ago. I did review his culture results which showed that he does have Staph aureus as well as Pseudomonas noted on the culture. Nonetheless the Levaquin that I prescribed for him does not appear to have been appropriate and in fact he tells me he is no longer experiencing the green drainage and discharge that he had at the last visit. Fortunately there is no signs of active infection at this time which is good news although the wound has significantly worsened it in fact is much deeper than it was previous. We have been utilizing up to this point triamcinolone ointment as the prescription topical of choice but at  this time I really feel like that the wound is getting need to be packed in order to appropriately manage this due to the deeper nature of the wound. Therefore something along the lines of an alginate dressing may be more appropriate. 05/31/2019 upon inspection today patient's wound actually showed signs of doing poorly at this point. Unfortunately he just does not seem to be making any good progress despite what we have tried. He actually did go ahead and pick up the Cipro and start taking that as he was noticing more green drainage he had previously completed the Levaquin that I prescribed for him as well. Nonetheless he missed his appointment for the seventh last week on  Wednesday with the wound care center and [facility] where his dermatologist referred him. Obviously I do think a second opinion would be helpful at this point especially in light of the fact that the patient seems to be doing so poorly despite the fact that we have tried everything that I really know how at this point. The only thing that ever seems to have helped him in the past is when he was on high doses of continual steroids that did seem to make a difference for him. 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 06/07/2019 upon evaluation today patient unfortunately appears to be doing worse yet again with regard to his wound. In fact I am starting to question whether or not he may have a fluid pocket in the muscle at this point based on the bulging and the soft appearance to the central portion of the muscle area. There is not anything draining from the muscle itself at this time which is good news but nonetheless the wound is expanding. I am not really seeing any results of the Humira as far as overall wound progression based on what I am seeing at this point. The  patient has been referred for second opinion with regard to his wound to the [facility] wound care center by his dermatologist which I definitely am not in opposition to. Unfortunately we tried multiple dressings in the past including collagen, alginate, and at one point even Hydrofera Blue. With that being said he is never really used it for any significant amount of time due to the fact that he often complains of pain associated with these dressings and then will go back to either using the Santyl which she has done intermittently or more frequently the triamcinolone. He is also using his own compression stockings. We have wrapped him in the past but again that was something else that he really was not a big fan of. 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 He seemed to do much better when he was on the steroids although honestly I understand that the steroids are not necessarily the best medication to be on long-term obviously 06/14/2019 on evaluation today patient appears to be doing actually a little bit better with regard to the overall appearance with his leg. Unfortunately he does continue to have issues with what appears to be some fluid underneath the muscle although he did see the wound specialty center at [facility] last week their main goals were to see about infusion therapy in place of the Humira as they feel like that is not quite strong enough. They also recommended that we continue with the treatment otherwise as we are they felt like that was appropriate and they are okay with him continuing to follow-up here with [place] in that regard. With that being said they are also sending  him to the vein specialist there to see about vein stripping and if that would be of benefit for him.  Subsequently they also did not really address whether or not an ultrasound of the muscle area to see if there is anything that needs to be addressed here would be appropriate or not. For that reason I discussed this with him last week I think we may proceed down that road at this point. 06/21/2019 upon evaluation today patient's wound actually appears to be doing slightly better compared to previous evaluations. I do believe that he has made a difference with regard to the progression here with the use of oral steroids. Again in the past has been the only thing that is really calm things down. He does tell me that from [facility] is gotten a good news from there that there are no further vein stripping that is necessary at this point. I do not have that available for review today although the patient did relay this to me. He also did obtain and have the ultrasound of the wound completed which I did sign off on today. It does appear that there is no fluid collection under the muscle this is likely then just edematous tissue in general. That is also good news. Overall I still believe the inflammation is the main issue here. He did inquire about the possibility of a wound VAC again with the muscle protruding like it is I am not really sure whether the wound VAC is necessarily ideal or not. That is something we will have to consider although I do believe he may need compression wrapping to try to help with edema control which could potentially be of benefit. 06/28/2019 on evaluation today patient appears to be doing slightly better measurement wise although this is not terribly smaller he least seems to be trending towards that direction. With that being said he still seems to have purulent drainage noted in the wound bed at this time. He has been on Levaquin followed by Cipro over the past month. Unfortunately he  still seems to have some issues with active infection at this time. I did perform a culture last week in order to evaluate and see if indeed there was still anything going on. Subsequently the culture did come back Clearman, Demitrius ([id number]) showing Pseudomonas which is consistent with the drainage has been having which is blue-green in color. He also has had an odor that again was somewhat consistent with Pseudomonas as well. Long story short it appears that the culture showed an intermediate finding with regard to how well the Cipro will work for the Pseudomonas infection. Subsequently being that he does not seem to be clearing up and at best what we are doing is just keeping this at [place] I think he may need to see infectious disease to discuss IV antibiotic options. 07/05/2019 upon evaluation today patient appears to be doing okay in regard to his leg ulcer. He has been tolerating the dressing changes at this point without complication. Fortunately there is no signs of active infection at this time which is good news. No fevers, chills, nausea, vomiting, or diarrhea. With that being said he does have an appointment with infectious disease tomorrow and his primary care on Wednesday. Again the reason for the infectious disease referral was due to the fact that he did not seem to be fully resolving with the use of oral antibiotics and therefore we were thinking that IV antibiotic therapy may be necessary secondary to the fact that there was an intermediate finding for how effective the Cipro may be. Nonetheless again he has been having a  lot of purulent and even green drainage. Fortunately right now that seems to have calmed down over the past week with the reinitiation of the oral antibiotic. Nonetheless we will see what Dr. Megan Salon has to say. 07/12/2019 upon evaluation today patient appears to be doing about the same at this point in regard to his left lower extremity ulcer. Fortunately there is no  signs of active infection at this time which is good news I do believe the Levaquin has been beneficial I did review Dr. Hale Bogus note and to be honest I agree that the patient's leg does appear to be doing better currently. What we found in the past as he does not seem to really completely resolve he will stop the antibiotic and then subsequently things will revert back to having issues with blue-green drainage, increased pain, and overall worsening in general. Obviously that is the reason I sent him back to infectious disease. 07/19/2019 upon evaluation today patient appears to be doing roughly the same in size there is really no dramatic improvement. He has started back on the Levaquin at this point and though he seems to be doing okay he did still have a lot of blue/green drainage noted on evaluation today unfortunately. I think that this is still indicative more likely of a Pseudomonas infection as previously noted and again he does see Dr. Megan Salon in just a couple of days. I do not know that were really able to effectively clear this with just oral antibiotics alone based on what I am seeing currently. Nonetheless we are still continue to try to manage as best we can with regard to the patient and his wound. I do think the wrap was helpful in decreasing the edema which is excellent news. No fevers, chills, nausea, vomiting, or diarrhea. 07/26/2019 upon evaluation today patient appears to be doing slightly better with regard to the overall appearance of the muscle there is no dark discoloration centrally. Fortunately there is no signs of active infection at this time. No fevers, chills, nausea, vomiting, or diarrhea. Patient's wound bed currently the patient did have an appointment with Dr. Megan Salon at infectious disease last week. With that being said Dr. Megan Salon the patient states was still somewhat hesitant about put him on any IV antibiotics he wanted [place] to repeat cultures today and then see  where things go going forward. He does look like Dr. Megan Salon because of some improvement the patient did have with the Levaquin wanted [place] to see about repeating cultures. If it indeed grows the Pseudomonas again then he recommended a possibility of considering a PICC line placement and IV antibiotic therapy. He plans to see the patient back in 1 to 2 weeks. 08/02/2019 upon evaluation today patient appears to be doing poorly with regard to his left lower extremity. We did get the results of his culture back it shows that he is still showing evidence of Pseudomonas which is consistent with the purulent/blue-green drainage that he has currently. Subsequently the culture also shows that he now is showing resistance to the oral fluoroquinolones which is unfortunate as that was really the only thing to treat the infection prior. I do believe that he is looking like this is going require IV antibiotic therapy to get this under control. Fortunately there is no signs of systemic infection at this time which is good news. The patient does see Dr. Megan Salon tomorrow. 08/09/2019 upon evaluation today patient appears to be doing better with regard to his left lower extremity ulcer  in regard to the overall appearance. He is currently on IV antibiotic therapy. As ordered by Dr. Megan Salon. Currently the patient is on ceftazidime which she is going to take for the next 2 weeks and then follow-up for 4 to 5-week appointment with Dr. Megan Salon. The patient started this this past Friday symptoms have not for a total of 3 days currently in full. 08/16/2019 upon evaluation today patient's wound actually does show muscle in the base of the wound but in general does appear to be much better as far as the overall evidence of infection is concerned. In fact I feel like this is for the most part cleared up he still on the IV antibiotics he has not completed the full course yet but I think he is doing much better which is excellent  news. 08/23/2019 upon evaluation today patient appears to be doing about the same with regard to his wound at this point. He tells me that he still has pain unfortunately. Fortunately there is no evidence of systemic infection at this time which is great news. There is significant muscle protrusion. 09/13/19 upon evaluation today patient appears to be doing about the same in regard to his leg unfortunately. He still has a lot of drainage coming from the ulceration there is still muscle exposed. With that being said the patient's last wound culture still showed an intermediate finding with regard to the Pseudomonas he still having the bluish/green drainage as well. Overall I do not know that the wound has completely cleared of infection at this point. Fortunately there is no signs of active infection systemically at this point which is good news. 09/20/2019 upon evaluation today patient's wound actually appears to be doing about the same based on what I am seeing currently. I do not see any signs of systemic infection he still does have evidence of some local infection and drainage. He did see Dr. Megan Salon last week and Dr. Megan Salon states that he probably does need a different IV antibiotic although he does not want to put him on this until the patient begins the Remicade infusion which is actually scheduled for about 10 days out from today on 13 May. Following that time Dr. Megan Salon is good to see him back and then will evaluate the feasibility of starting him on the IV antibiotic therapy once again at that point. I do not disagree with this plan I do believe as Dr. Megan Salon stated in his note that I reviewed today that the patient's issue is multifactorial with the pyoderma being 1 aspect of this that were hoping the Remicade will be helpful for her. In the meantime I think the gentamicin is, helping to keep things under decent okay control in regard to the ulcer. 09/27/2019 upon evaluation today patient  appears to be doing about the same with regard to his wound still there is a lot of muscle exposure though he does have some hyper granulation tissue noted around the edge and actually some granulation tissue starting to form over the muscle which is actually good news. Fortunately there is no evidence of active infection which is also good news. His pain is less at this point. 5/21; this is a patient I have not seen in a long time. 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 He has a large wound on the left lateral leg with protruding muscle. He comes in the clinic today showing the same area on his left medial ankle. He says there is  been a spot there for some time although we have not previously defined this. Today he has a clearly defined area with slight amount of skin breakdown surrounded by raised areas with a purplish hue in color. This is not painful he says it is irritated. 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 He still has muscle protruding from the lateral portion of his left leg but fortunately the new area that they were concerned about at his last visit does not appear to have opened at this point. He is currently on Remicade infusions and seems to be doing better in my opinion in fact the wound itself seems to be overall much better. The purplish discoloration that he did have seems to have resolved and I think that is a good sign that hopefully the Remicade is doing its job. He does [name], [name] ([id number]) have some biofilm noted over the surface of the wound. 11/01/2019 on evaluation today patient's wound actually appears to be doing excellent at this time. Fortunately there is no evidence of active infection and overall I feel like he is making great progress. The Remicade seems to be due excellent job in my opinion. 11/08/19  evaluation today vision actually appears to be doing quite well with regard to his weight ulcer. He's been tolerating dressing changes without complication. Fortunately there is no evidence of infection. No fevers, chills, nausea, or vomiting noted at this time. Overall states that is having more itching than pain which is actually a good sign in my opinion. 12/13/2019 upon evaluation today patient appears to be doing well today with regard to his wound. He has been tolerating the dressing changes without complication. Fortunately there is no sign of active infection at this time. No fevers, chills, nausea, vomiting, or diarrhea. Overall I feel like the infusion therapy has been very beneficial for him. 01/06/2020 on evaluation today patient appears to be doing well with regard to his wound. This is measuring smaller and actually looks to be doing better. Fortunately there is no signs of active infection at this point. No fevers, chills, nausea, vomiting, or diarrhea. With that being said he does still have the blue-green drainage but this does not seem to be causing any significant issues currently. He has been using the gentamicin that does seem to be keeping things under decent control at this point. He goes later this morning for his next infusion therapy for the pyoderma which seems to also be very beneficial. 02/07/2020 on evaluation today patient appears to be doing about the same in regard to his wounds currently. Fortunately there is no signs of active infection systemically he does still have evidence of local infection still using gentamicin. He also is showing some signs of improvement albeit slowly I do feel like we are making some progress here. 02/21/2020 upon evaluation today patient appears to be making some signs of improvement the wound is measuring a little bit smaller which is great news and overall I am very pleased with where he stands currently. He is going to be having infusion  therapy treatment on the 15th of this month. Fortunately there is no signs of active infection at this time. 03/13/2020 I do believe patient's wound is actually showing some signs of improvement here which is great news. He has continue with the infusion therapy through rheumatology/dermatology at [facility]. That does seem to be beneficial. I still think he gets as  much benefit from this as he did from the prednisone initially but nonetheless obviously this is less harsh on his body that the prednisone as far as they are concerned. 03/31/2020 on evaluation today patient's wound actually showing signs of some pretty good improvement in regard to the overall appearance of the wound bed. There is still muscle exposed though he does have some epithelial growth around the edges of the wound. Fortunately there is no signs of active infection at this time. No fevers, chills, nausea, vomiting, or diarrhea. 04/24/2020 upon evaluation today patient appears to be doing about the same in regard to his leg ulcer. He has been tolerating the dressing changes without complication. Fortunately there is no signs of active infection at this time. No fevers, chills, nausea, vomiting, or diarrhea. With that being said he still has a lot of irritation from the bandaging around the edges of the wound. We did discuss today the possibility of a referral to plastic surgery. 05/22/2020 on evaluation today patient appears to be doing well with regard to his wounds all things considered. He has not been able to get the Chantix apparently there is a recall nurse that I was unaware of put out by [employer] involuntarily. Nonetheless for now I am and I have to do some research into what may be the best option for him to help with quitting in regard to smoking and we discussed that today. 06/26/2020 upon evaluation today patient appears to be doing well with regard to his wound from the standpoint of infection I do not see any signs of infection  at this point. With that being said unfortunately he is still continuing to have issues with muscle exposure and again he is not having a whole lot of new skin growth unfortunately. There does not appear to be any signs of active infection at this time. No fevers, chills, nausea, vomiting, or diarrhea. 07/10/2020 upon evaluation today patient appears to be doing a little bit more poorly currently compared to where he was previous. I am concerned currently about an active infection that may be getting worse especially in light of the increased size and tenderness of the wound bed. No fevers, chills, nausea, vomiting, or diarrhea. 07/24/2020 upon evaluation today patient appears to be doing poorly in regard to his leg ulcer. He has been tolerating the dressing changes without complication but unfortunately is having a lot of discomfort. 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 Subsequently I think he is going require possibly IV antibiotics to get this under control. I am very concerned about the severity of his infection and the amount of discomfort he is having. 07/31/2020 upon evaluation today patient appears to be doing about the same in regard to his leg wound. He did see Dr. Megan Salon and Dr. Megan Salon is actually going to start him on IV antibiotics. He goes for the PICC line tomorrow. With that being said there do not have that run for 2 weeks and then see how things are doing and depending on how he is progressing they may extend that a little longer. Nonetheless I am glad this is getting ready to be in place and definitely feel it may help the patient. In the meantime is been using mainly triamcinolone to the wound bed has an anti-inflammatory. 08/07/2020 on evaluation today patient appears to be doing well with regard to his wound compared even last week. In the interim he has gotten the PICC line  placed and overall this seems  to be doing excellent. There does not appear to be any evidence of infection which is great news systemically although locally of course has had the infection this appears to be improving with the use of the antibiotics. 08/14/2020 upon evaluation today patient's wound actually showing signs of excellent improvement. Overall the irritation has significantly improved the drainage is back down to more of a normal level and his pain is really pretty much nonexistent compared to what it was. Obviously I think that this is significantly improved secondary to the IV antibiotic therapy which has made all the difference in the world. Again he had a resistant form of Pseudomonas for which oral antibiotics just was not cutting it. Nonetheless I do think that still we need to consider the possibility of a surgical closure for this wound is been open so long and to be honest with muscle exposed I think this can be very hard to get this to close outside of this although definitely were still working to try to do what we can in that regard. 08/21/2020 upon evaluation today patient appears to be doing very well with regard to his wounds on the left lateral lower extremity/calf area. Fortunately there does not appear to be signs of active infection which is great news and overall very pleased with where things stand today. He is actually wrapping up his treatment with IV antibiotics tomorrow. After that we will see where things go from there. 08/28/2020 upon evaluation today patient appears to be doing decently well with regard to his leg ulcer. There does not appear to be any signs of [name], [name] ([id number]) active infection which is great news and overall very pleased with where things stand today. No fevers, chills, nausea, vomiting, or diarrhea. 09/18/2020 upon evaluation today patient appears to be doing well with regard to his infection which I feel like is better. Unfortunately he is not doing as well with regard  to the overall size of the wound which is not nearly as good at this point. I feel like that he may be having an issue here with the pyoderma being somewhat out of control. I think that he may benefit from potentially going back and talking to the dermatologist about what to do from the pyoderma standpoint. I am not certain if the infusions are helping nearly as much is what the prednisone did in the past. 10/02/2020 upon evaluation today patient appears to be doing well with regard to his leg ulcer. He did go to the [facility]. Unfortunately they feel like there is a 10% chance that most that he would be able to heal and that the skin graft would take. Obviously this has led him to not be able to go down that path as far as treatment is concerned. Nonetheless he does seem to be doing a little bit better with the prednisone that I gave him last time. I think that he may need to discuss with dermatology the possibility of long-term prednisone as that seems to be what is most helpful for him to be perfectly honest. I am not sure the Remicade is really doing the job. 10/17/2020 upon evaluation today patient appears to be doing a little better in regard to his wound. In fact the case has been since we did the prednisone on May 2 for him that we have noticed a little bit of improvement each time we have seen a size wise as well as appearance wise as  well as pain wise. I think the prednisone has had a greater effect then the infusion therapy has to be perfectly honest. With that being said the patient also feels significantly better compared to what he was previous. All of this is good news but nonetheless I am still concerned about the fact that again we are really not set up to long-term manage him as far as prednisone is concerned. Obviously there are things that you need to be watched I completely understand the risk of prednisone usage as well. That is why has been doing the infusion therapy to try  and control some of the pyoderma. With all that being said I do believe that we can give him another round of the prednisone which she is requesting today because of the improvement that he seen since we did that first round. 10/30/2020 upon evaluation today patient's wound actually is showing signs of doing quite well. There does not appear to be any evidence of infection which is great news and overall very pleased with where things stand today. No fevers, chills, nausea, vomiting, or diarrhea. He tells me that the prednisone still has seem to have helped he wonders if we can extend that for just a little bit longer. He did not have the appointment with a dermatologist although he did have an infusion appointment last Friday. That was at [facility]. With that being said he tells me he could not do both that as well as the appointment with the physician on the same day therefore that is can have to be rescheduled. I really want to see if there is anything they feel like that could be done differently to try to help this out as I am not really certain that the infusions are helping significantly here. 11/13/2020 upon evaluation today patient unfortunately appears to be doing somewhat poorly in regard to his wound I feel like this is actually worsening from the standpoint of the pyoderma spreading. I still feel like that he may need something different as far as trying to manage this going forward. Again we did the prednisone unfortunately his blood sugars are not doing so well because of this. Nonetheless I believe that the patient likely needs to try topical steroid. We have done triamcinolone for a while I think going with something stronger such as clobetasol could be beneficial again this is not something I do lightly I discussed this with the patient that again this does not normally put underneath an occlusive dressing. Nonetheless I think a thin film as such could help with some of the stronger  anti-inflammatory effects. We discussed this today. He would like to try to give this a trial for the next couple weeks. I definitely think that is something that we can do. Evaluate7/03/2021 and today patient's wound bed actually showed signs of doing really about the same. There was a little expansion of the size of the wound and that leading edge that we done looking out although the clobetasol does seem to have slowed this down a bit in my opinion. There is just 1 small area that still seems to be progressing based on what I see. Nonetheless I am concerned about the fact this does not seem to be improving if anything seems to be doing a little bit worse. I do not know that the infusions are really helping him much as next infusion is August 5 his appointment with dermatology is July 25. Either way I really think that we need to  have a conversation potentially about this and I am actually going to see if I can talk with Dr. Lillia Carmel in order to see where things stand as well. 12/11/2020 upon evaluation today patient appears to be doing worse in regard to his leg ulcer. Unfortunately I just do not think this is making the progress that I would like to see at this point. Honestly he does have an appointment with dermatology and this is in 2 days. I am wondering what they may have to offer to help with this. Right now what I am seeing is that he is continuing to show signs of worsening little by little. Obviously that is not great at all. Is the exact opposite of what we are looking for. 12/18/2020 upon evaluation today patient appears to be doing a little better in regard to his wound. The dermatologist actually did do some steroid injections into the wound which does seem to have been beneficial in my opinion. That was on the 25th already this looks a little better to me than last time I saw him. With that being said we did do a culture and this did show that he has Staph aureus noted in abundance in  the wound. With that being said I do think that getting him on an oral antibiotic would be appropriate as well. Also think we can compression wrap and this will make a difference as well. 12/28/2020 upon evaluation today patient's wound is actually showing signs of doing much better. I do believe the compression wrap is helping he has a lot of drainage but to be honest I think that the compression is helping to some degree in this regard as well as not draining through which is also good news. No fevers, chills, nausea, vomiting, or diarrhea. 01/04/2021 upon evaluation today patient appears to be doing well with regard to his wound. Overall things seem to be doing quite well. He did have a little bit of reaction to the CarboFlex Sorbact he will be using that any longer. With that being said he is controlled as far as the drainage is concerned overall and seems to be doing quite well. I do not see any signs of active infection at this time which is great news. No fevers, chills, nausea, vomiting, or diarrhea. 01/11/2021 upon evaluation today patient appears to be doing well with regard to his wounds. He has been tolerating the dressing changes without complication. Fortunately there does not appear to be any signs of active infection at this time which is great news. Overall I am extremely pleased with where we stand currently. No fevers, chills, nausea, vomiting, or diarrhea. Where using clobetasol in the wound bed he has a lot of new skin growth which is awesome as well. 01/18/2021 upon evaluation today patient appears to be doing very well in regard to his leg ulcer. He has been tolerating the dressing changes without complication. Fortunately there does not appear to be any signs of active infection which is great news. In general I think that he is making excellent progress 01/25/2021 upon evaluation today patient appears to be doing well with regard to his wound on the leg. I am actually extremely  pleased with where things stand today. There does not appear to be any signs of active infection which is great news and overall I think that we are definitely headed in the appropriate direction based on what I am seeing currently. There does not appear to be any signs of active infection  also excellent news. 02/06/2021 upon evaluation today patient appears to be doing well with regard to his wound. Overall visually this is showing signs of significant [name], [name] ([id number]) improvement which is great news. I do not see any signs of active infection systemically which is great even locally I do not think that we are seeing any major complications here. We did do fluorescence imaging with the [employer] DX today. The patient does have some odor and drainage noted and again this is something that I think would benefit him to probably come more frequently for nurse visits. 02/19/2021 upon evaluation today patient actually appears to be doing quite well in regard to his wound. He has been tolerating the dressing changes without complication and overall I think that this is making excellent progress. I do not see any evidence of active infection at this point which is great news as well. No fevers, chills, nausea, vomiting, or diarrhea. 10/10; wound is made nice progress healthy granulation with a nice rim of epithelialization which seems to be expanding even from last week he has a deeper area in the inferior part of the more distal part of the wound with not quite as healthy as surface. This area will need to be followed. Using clobetasol and Hydrofera Blue 03/05/2021 upon evaluation today patient appears to be doing very well in regard to his leg ulcer. He has been tolerating dressing changes without complication. Fortunately there does not appear to be any signs of active infection which is great news and overall I am extremely pleased with where we stand currently. 03/12/2021 upon evaluation  today patient appears to be doing well with regard to his wound in fact this is extremely extremely good based on what we are seeing today there does not appear to be any signs of active infection and overall I think that he is doing awesome from the standpoint of healing in general. I am extremely pleased with how things seem to be progressing with regard to this pyoderma. Clobetasol has done wonders for him. I think the compression wrapping has also been of great benefit. 03/19/2021 upon evaluation today patient appears to be doing well with regard to his wound. He is tolerating the dressing changes without complication. In fact I feel like that he is actually making excellent progress at this point based on what I am seeing. No fevers, chills, nausea, vomiting, or diarrhea. 03/26/2021 upon evaluation today patient appears to be doing well with regard to his wound. This again is measuring smaller and looking better. Again the progress is slow but nonetheless continual with what we have been seeing. I do believe that the current plan is doing awesome for him. 04/09/2021 upon evaluation today patient appears to be doing well with regard to his leg ulcer. This is showing signs of excellent improvement the muscle is completely closed over and there does not appear to be any evidence of inflammation at this point his drainage is significantly improved. Overall I think that he would be a good candidate for looking into a skin substitute at this point as well. We will get a look into some approvals in that regard. Potentially TheraSkin as well as Apligraf could both be considered just depending on insurance coverage. Objective Constitutional Obese and well-hydrated in no acute distress. Vitals Time Taken: 8:15 AM, Height: 71 in, Weight: 338 lbs, BMI: 47.1, Temperature: 98.2 F, Pulse: 84 bpm, Respiratory Rate: 16 breaths/min, Blood Pressure: 133/81 mmHg. Respiratory normal breathing without  difficulty. Psychiatric this  patient is able to make decisions and demonstrates good insight into disease process. Alert and Oriented x 3. pleasant and cooperative. General Notes: Upon inspection patient's wound bed actually showed signs of excellent granulation epithelization at this point. I am actually extremely pleased with where we stand. I think that he could benefit from either TheraSkin or Apligraf as far as trying to improve the overall appearance and healing prospects here. I think that will help to speed things up I especially think the TheraSkin could be of benefit. Integumentary (Hair, Skin) Wound #1 status is Open. Original cause of wound was Gradually Appeared. The date acquired was: 11/18/2015. The wound has been in treatment 226 weeks. The wound is located on the Left,Lateral Lower Leg. The wound measures 5.4cm length x 2.2cm width x 0.3cm depth; 9.331cm^2 area and 2.799cm^3 volume. There is Fat Layer (Subcutaneous Tissue) exposed. There is no tunneling or undermining noted. There is a small amount of serosanguineous drainage noted. The wound margin is thickened. There is large (67-100%) red, pink granulation within the wound bed. There is a small (1-33%) amount of necrotic tissue within the wound bed including Adherent Slough. 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 There was a Four Layer Compression Therapy Procedure with a pre-treatment ABI of 1.2 by [name], RN. Post procedure Diagnosis Wound #1: Same as Pre-Procedure Plan Follow-up Appointments: Return Appointment in 1 week. Nurse Visit as  needed - twice a week Bathing/ Shower/ Hygiene: Clean wound with Normal Saline or wound cleanser. Other: - Apply dakins soaked gauze on wound after cleansing wound IN OFFICE ONLY Edema Control - Lymphedema / Segmental Compressive Device / Other: Optional: One layer of unna paste to top of compression wrap (to act as an anchor). Elevate, Exercise Daily and Avoid Standing for Long Periods of Time. Elevate legs to the level of the heart and pump ankles as often as possible Elevate leg(s) parallel to the floor when sitting. WOUND #1: - Lower Leg Wound Laterality: Left, Lateral Cleanser: Wound Cleanser 3 x Per Week/30 Days Discharge Instructions: Wash your hands with soap and water. Remove old dressing, discard into plastic bag and place into trash. Cleanse the wound with Wound Cleanser prior to applying a clean dressing using gauze sponges, not tissues or cotton balls. Do not scrub or use excessive force. Pat dry using gauze sponges, not tissue or cotton balls. Peri-Wound Care: Moisturizing Lotion 3 x Per Week/30 Days Discharge Instructions: Suggestions: Theraderm, Eucerin, Cetaphil, or patient preference. Topical: Clobetasol Propionate ointment 0.05%, 60 (g) tube 3 x Per Week/30 Days Discharge Instructions: Apply to 7 o'clock depth Primary Dressing: Hydrofera Blue Ready Transfer Foam, 4x5 (in/in) 3 x Per Week/30 Days Discharge Instructions: Apply Hydrofera Blue Ready to wound bed as directed Secondary Dressing: Xtrasorb Medium 4x5 (in/in) 3 x Per Week/30 Days Discharge Instructions: Apply to wound as directed. Do not cut. Compression Wrap: Medichoice 4 layer Compression System, 35-40 mmHG (Generic) 3 x Per Week/30 Days Discharge Instructions: Apply multi-layer wrap as directed. 1. Would recommend that we go ahead and continue with the wound care measures as before and the patient is in agreement with plan for the time being while we get approvals another possibilities we will continue with the  current treatment plan. 2. I am also can recommend that we have the patient continue specifically with the [facility] as well as the XtraSorb to cover and then a 4-layer compression wrap. He still using the clobetasol as well. We will see patient back for reevaluation in 1 week here in the clinic. If anything worsens or changes patient will contact our office for additional recommendations. Electronic Signature(s) Signed: 04/09/2021 10:14:10 AM By: Worthy Keeler PA-C Entered By: Worthy Keeler on 04/09/2021 10:14:09 [name] ([id number]) -------------------------------------------------------------------------------- SuperBill Details Patient Name: [name] Date of Service: 04/09/2021 Medical Record Number: [id number] Patient Account Number: [id number] Date of Birth/Sex: 05-Oct-1980 (40 y.o. M) Treating RN: [name] Primary Care Provider: [name] Other Clinician: Referring Provider: [name] Treating Provider/Extender: [name] Cliche in Treatment: 226 Diagnosis Coding ICD-10 Codes Code Description I87.2 Venous insufficiency (chronic) (peripheral) L97.222 Non-pressure chronic ulcer of left calf with fat layer exposed E11.622 Type 2 diabetes mellitus with other skin ulcer L88 Pyoderma gangrenosum F17.208 Nicotine dependence, unspecified, with other nicotine-induced disorders Facility Procedures CPT4 Code: 09381829 Description: (Facility Use Only) [phone] - [place] LWR LT LEG Modifier: Quantity: 1 Physician Procedures CPT4 Code: 7893810 Description: 17510 - WC PHYS LEVEL 4 - EST PT Modifier: Quantity: 1 CPT4 Code: Description: ICD-10 Diagnosis Description I87.2 Venous insufficiency (chronic) (peripheral) L97.222 Non-pressure chronic ulcer of left calf with fat layer exposed E11.622 Type 2 diabetes mellitus with other skin ulcer L88 Pyoderma gangrenosum Modifier: Quantity: Electronic Signature(s) Signed: 04/09/2021 10:15:12 AM By:  Worthy Keeler PA-C Entered By: Worthy Keeler on 04/09/2021 10:15:10

## 2021-04-10 ENCOUNTER — Encounter: Payer: 59 | Admitting: Physician Assistant

## 2021-04-10 NOTE — Progress Notes (Signed)
EMAURI, KRYGIER ([id number]) Visit Report for 04/09/2021 Arrival Information Details Patient Name: [name], [name] Date of Service: 04/09/2021 8:15 AM Medical Record Number: [id number] Patient Account Number: [id number] Date of Birth/Sex: Mar 04, 1981 (41 y.o. M) Treating RN: [name] Primary Care Vollie Brunty: [name] Other Clinician: Referring Christpher Stogsdill: [name] Treating Doloras Tellado/Extender: [name] Cliche in Treatment: 32 Visit Information History Since Last Visit Added or deleted any medications: No Patient Arrived: Ambulatory Has Dressing in Place as Prescribed: Yes Arrival Time: 08:23 Has Compression in Place as Prescribed: Yes Accompanied By: self Pain Present Now: No Transfer Assistance: None Patient Identification Verified: Yes Secondary Verification Process Completed: Yes Patient Requires Transmission-Based No Precautions: Patient Has Alerts: Yes Patient Alerts: Patient has reaction to silver dressings. Electronic Signature(s) Signed: 04/10/2021 4:45:38 PM By: [name], [name], RN, [name], [name], [name] Entered By: [name], [name], RN, [name], [name] on 04/09/2021 08:23:27 [name], [name] ([id number]) -------------------------------------------------------------------------------- Compression Therapy Details Patient Name: [name] Date of Service: 04/09/2021 8:15 AM Medical Record Number: [id number] Patient Account Number: [id number] Date of Birth/Sex: Apr 21, 1981 (40 y.o. M) Treating RN: [name] Primary Care Jamaris Theard: [name] Other Clinician: Referring Jocelyne Reinertsen: [name] Treating Jyssica Rief/Extender: [name] Cliche in Treatment: 226 Compression Therapy Performed for Wound Assessment: Wound #1 Left,Lateral Lower Leg Performed By: Clinician [name], RN Compression Type: Four Layer Pre Treatment ABI: 1.2 Post Procedure Diagnosis Same as Pre-procedure Electronic Signature(s) Signed: 04/10/2021 4:45:38 PM By: [name], [name], RN, [name], [name], [name] Entered  By: [name], [name], RN, [name], [name] on 04/09/2021 08:46:47 [name] ([id number]) -------------------------------------------------------------------------------- Encounter Discharge Information Details Patient Name: [name] Date of Service: 04/09/2021 8:15 AM Medical Record Number: [id number] Patient Account Number: [id number] Date of Birth/Sex: 1981-03-07 (40 y.o. M) Treating RN: [name] Primary Care Mckena Chern: [name] Other Clinician: Referring Tejay Hubert: [name] Treating Avrey Hyser/Extender: [name] Cliche in Treatment: 226 Encounter Discharge Information Items Discharge Condition: Stable Ambulatory Status: Ambulatory Discharge Destination: Home Transportation: Private Auto Schedule Follow-up Appointment: Yes Clinical Summary of Care: Electronic Signature(s) Signed: 04/10/2021 4:45:38 PM By: [name], [name], RN, [name], [name], [name] Entered By: [name], [name], RN, [name], [name] on 04/09/2021 09:04:19 [name] ([id number]) -------------------------------------------------------------------------------- Lower Extremity Assessment Details Patient Name: [name] Date of Service: 04/09/2021 8:15 AM Medical Record Number: [id number] Patient Account Number: [id number] Date of Birth/Sex: Jun 25, 1980 (40 y.o. M) Treating RN: [name] Primary Care Beulah Capobianco: [name] Other Clinician: Referring Correne Lalani: [name] Treating Favor Hackler/Extender: [name] Cliche in Treatment: 226 Edema Assessment Assessed: [Left: Yes] [Right: No] Edema: [Left: Ye] [Right: s] Calf Left: Right: Point of Measurement: 33 cm From Medial Instep 47.7 cm Ankle Left: Right: Point of Measurement: 12 cm From Medial Instep 29.5 cm Vascular Assessment Pulses: Dorsalis Pedis Palpable: [Left:Yes] Electronic Signature(s) Signed: 04/10/2021 4:45:38 PM By: [name], [name], RN, [name], [name], [name] Entered By: [name], [name], RN, [name], [name] on 04/09/2021 08:29:15 [name]  ([id number]) -------------------------------------------------------------------------------- Multi Wound Chart Details Patient Name: [name] Date of Service: 04/09/2021 8:15 AM Medical Record Number: [id number] Patient Account Number: [id number] Date of Birth/Sex: 05/10/1981 (40 y.o. M) Treating RN: [name] Primary Care Searra Carnathan: [name] Other Clinician: Referring Kaylea Mounsey: [name] Treating Laurann Mcmorris/Extender: [name] Cliche in Treatment: 226 Vital Signs Height(in): 71 Pulse(bpm): 84 Weight(lbs): 338 Blood Pressure(mmHg): 133/81 Body Mass Index(BMI): 47 Temperature(F): 98.2 Respiratory Rate(breaths/min): 16 Photos: [N/A:N/A] Wound Location: Left, Lateral Lower Leg N/A N/A Wounding Event: Gradually Appeared N/A N/A Primary Etiology: Pyoderma N/A N/A Comorbid History: Sleep Apnea, Hypertension, Colitis N/A N/A Date Acquired: 11/18/2015 N/A  N/A Weeks of Treatment: 226 N/A N/A Wound Status: Open N/A N/A Measurements L x W x D (cm) 5.4x2.2x0.3 N/A N/A Area (cm) : 9.331 N/A N/A Volume (cm) : 2.799 N/A N/A % Reduction in Area: -90.10% N/A N/A % Reduction in Volume: 28.70% N/A N/A Classification: Full Thickness With Exposed N/A N/A Support Structures Exudate Amount: Small N/A N/A Exudate Type: Serosanguineous N/A N/A Exudate Color: red, brown N/A N/A Wound Margin: Thickened N/A N/A Granulation Amount: Large (67-100%) N/A N/A Granulation Quality: Red, Pink N/A N/A Necrotic Amount: Small (1-33%) N/A N/A Exposed Structures: Fat Layer (Subcutaneous Tissue): N/A N/A Yes Muscle: Yes Treatment Notes Electronic Signature(s) Signed: 04/10/2021 4:45:38 PM By: [name], [name], RN, [name], [name], [name] Entered By: [name], [name], RN, [name], [name] on 04/09/2021 08:46:05 [name] ([id number]) -------------------------------------------------------------------------------- Multi-Disciplinary Care Plan Details Patient Name: [name] Date of Service: 04/09/2021 8:15  AM Medical Record Number: [id number] Patient Account Number: [id number] Date of Birth/Sex: April 02, 1981 (40 y.o. M) Treating RN: [name] Primary Care Yoselin Amerman: [name] Other Clinician: Referring Ardine Iacovelli: [name] Treating Jabree Rebert/Extender: [name] Cliche in Treatment: 69 [place] reviewed with physician Active Inactive Necrotic Tissue Nursing Diagnoses: Impaired tissue integrity related to necrotic/devitalized tissue Knowledge deficit related to management of necrotic/devitalized tissue Goals: Necrotic/devitalized tissue will be minimized in the wound bed Date Initiated: 03/26/2021 Target Resolution Date: 03/26/2021 Goal Status: Active Patient/caregiver will verbalize understanding of reason and process for debridement of necrotic tissue Date Initiated: 03/26/2021 Target Resolution Date: 03/26/2021 Goal Status: Active Interventions: Assess patient pain level pre-, during and post procedure and prior to discharge Provide education on necrotic tissue and debridement process Treatment Activities: Apply topical anesthetic as ordered : 03/26/2021 Notes: Electronic Signature(s) Signed: 04/10/2021 4:45:38 PM By: [name], [name], RN, [name], [name], [name] Entered By: [name], [name], RN, [name], [name] on 04/09/2021 08:45:53 [name] ([id number]) -------------------------------------------------------------------------------- Pain Assessment Details Patient Name: [name] Date of Service: 04/09/2021 8:15 AM Medical Record Number: [id number] Patient Account Number: [id number] Date of Birth/Sex: 20-Dec-1980 (40 y.o. M) Treating RN: [name] Primary Care Lavern Crimi: [name] Other Clinician: Referring Huzaifa Viney: [name] Treating Mao Lockner/Extender: [name] Cliche in Treatment: 226 Active Problems Location of Pain Severity and Description of Pain Patient Has Paino No Site Locations Pain Management and Medication Current Pain  Management: Notes Patient denies pain at this time. Electronic Signature(s) Signed: 04/10/2021 4:45:38 PM By: [name], [name], RN, [name], [name], [name] Entered By: [name], [name], RN, [name], [name] on 04/09/2021 08:24:05 [name] ([id number]) -------------------------------------------------------------------------------- Patient/Caregiver Education Details Patient Name: [name] Date of Service: 04/09/2021 8:15 AM Medical Record Number: [id number] Patient Account Number: [id number] Date of Birth/Gender: 29-Mar-1981 (40 y.o. M) Treating RN: [name] Primary Care Physician: [name] Other Clinician: Referring Physician: [name] Treating Physician/Extender: [name] Cliche in Treatment: 76 Education Assessment Education Provided To: Patient Education Topics Provided Venous: Handouts: Controlling Swelling with Multilayered Compression Wraps Methods: Demonstration, Explain/Verbal Responses: State content correctly Wound/Skin Impairment: Handouts: Caring for Your Ulcer Methods: Demonstration, Explain/Verbal Responses: State content correctly Electronic Signature(s) Signed: 04/10/2021 4:45:38 PM By: [name], [name], RN, [name], [name], [name] Entered By: [name], [name], RN, [name], [name] on 04/09/2021 09:02:20 [name] ([id number]) -------------------------------------------------------------------------------- Wound Assessment Details Patient Name: [name] Date of Service: 04/09/2021 8:15 AM Medical Record Number: [id number] Patient Account Number: [id number] Date of Birth/Sex: [birth year] (40 y.o. M) Treating RN: [name] Primary Care Jondavid Schreier: [name] Other Clinician: Referring Esme Durkin: [name] Treating Deliana Avalos/Extender: [name] Cliche in Treatment: 226 Wound  Status Wound Number: 1 Primary Etiology: Pyoderma Wound Location: Left, Lateral Lower Leg Wound Status: Open Wounding Event: Gradually Appeared Comorbid History: Sleep Apnea, Hypertension,  Colitis Date Acquired: 11/18/2015 Weeks Of Treatment: 226 Clustered Wound: No Photos Wound Measurements Length: (cm) 5.4 Width: (cm) 2.2 Depth: (cm) 0.3 Area: (cm) 9.331 Volume: (cm) 2.799 % Reduction in Area: -90.1% % Reduction in Volume: 28.7% Tunneling: No Undermining: No Wound Description Classification: Full Thickness With Exposed Support Structures Wound Margin: Thickened Exudate Amount: Small Exudate Type: Serosanguineous Exudate Color: red, brown Foul Odor After Cleansing: No Slough/Fibrino Yes Wound Bed Granulation Amount: Large (67-100%) Exposed Structure Granulation Quality: Red, Pink Fat Layer (Subcutaneous Tissue) Exposed: Yes Necrotic Amount: Small (1-33%) Muscle Exposed: No Necrotic Quality: Adherent Slough Treatment Notes Wound #1 (Lower Leg) Wound Laterality: Left, Lateral Cleanser Wound Cleanser Discharge Instruction: Wash your hands with soap and water. Remove old dressing, discard into plastic bag and place into trash. Cleanse the wound with Wound Cleanser prior to applying a clean dressing using gauze sponges, not tissues or cotton balls. Do not scrub or use excessive force. Pat dry using gauze sponges, not tissue or cotton balls. Peri-Wound Care Moisturizing [name], [name] ([id number]) Discharge Instruction: Suggestions: Theraderm, Eucerin, Cetaphil, or patient preference. Topical Clobetasol Propionate ointment 0.05%, 60 (g) tube Discharge Instruction: Apply to 7 o'clock depth Primary Dressing Hydrofera Blue Ready Transfer Foam, 4x5 (in/in) Discharge Instruction: Apply Hydrofera Blue Ready to wound bed as directed Secondary Dressing Xtrasorb Medium 4x5 (in/in) Discharge Instruction: Apply to wound as directed. Do not cut. Secured With Compression Wrap Medichoice 4 layer Compression System, 35-40 mmHG Discharge Instruction: Apply multi-layer wrap as directed. Compression Stockings Environmental education officer) Signed: 04/10/2021  4:45:38 PM By: [name], [name], RN, [name], [name], [name] Entered By: [name], [name], RN, [name], [name] on 04/09/2021 08:47:11 [name], [name] ([id number]) -------------------------------------------------------------------------------- Vitals Details Patient Name: [name] Date of Service: 04/09/2021 8:15 AM Medical Record Number: [id number] Patient Account Number: [id number] Date of Birth/Sex: Jul 31, 1980 (40 y.o. M) Treating RN: [name] Primary Care Amun Stemm: [name] Other Clinician: Referring Mikayela Deats: [name] Treating Natiya Seelinger/Extender: [name] Cliche in Treatment: 226 Vital Signs Time Taken: 08:15 Temperature (F): 98.2 Height (in): 71 Pulse (bpm): 84 Weight (lbs): 338 Respiratory Rate (breaths/min): 16 Body Mass Index (BMI): 47.1 Blood Pressure (mmHg): 133/81 Reference Range: 80 - 120 mg / dl Electronic Signature(s) Signed: 04/10/2021 4:45:38 PM By: [name], [name], RN, [name], [name], [name] Entered By: [name], [name], RN, [name], [name] on 04/09/2021 08:23:51

## 2021-04-11 ENCOUNTER — Other Ambulatory Visit: Payer: Self-pay

## 2021-04-11 DIAGNOSIS — E11622 Type 2 diabetes mellitus with other skin ulcer: Secondary | ICD-10-CM | POA: Diagnosis not present

## 2021-04-11 NOTE — Progress Notes (Signed)
[name], [name] ([id number]) Visit Report for 04/11/2021 Arrival Information Details Patient Name: [name], [name] Date of Service: 04/11/2021 8:00 AM Medical Record Number: [id number] Patient Account Number: [id number] Date of Birth/Sex: [birth year] (40 y.o. M) Treating RN: Donnamarie Poag Primary Care Deidre Carino: [name] Other Clinician: Referring Rachell Druckenmiller: [name] Treating Karuna Balducci/Extender: Yaakov Guthrie in Treatment: 76 Visit Information History Since Last Visit Added or deleted any medications: No Patient Arrived: Ambulatory Had a fall or experienced change in No Arrival Time: 08:05 activities of daily living that may affect Accompanied By: self risk of falls: Transfer Assistance: None Hospitalized since last visit: No Patient Identification Verified: Yes Has Dressing in Place as Prescribed: Yes Secondary Verification Process Completed: Yes Has Compression in Place as Prescribed: Yes Patient Requires Transmission-Based No Pain Present Now: No Precautions: Patient Has Alerts: Yes Patient Alerts: Patient has reaction to silver dressings. Electronic Signature(s) Signed: 04/11/2021 10:13:57 AM By: Donnamarie Poag Entered By: Donnamarie Poag on 04/11/2021 08:25:09 [name] ([id number]) -------------------------------------------------------------------------------- Clinic Level of Care Assessment Details Patient Name: [name] Date of Service: 04/11/2021 8:00 AM Medical Record Number: [id number] Patient Account Number: [id number] Date of Birth/Sex: 08/21/1980 (40 y.o. M) Treating RN: Donnamarie Poag Primary Care Avah Bashor: [name] Other Clinician: Referring Iliany Losier: [name] Treating Mehek Grega/Extender: Yaakov Guthrie in Treatment: 227 Clinic Level of Care Assessment Items TOOL 1 Quantity Score []  - Use when EandM and Procedure is performed on INITIAL visit 0 ASSESSMENTS - Nursing Assessment / Reassessment []  - General Physical Exam  (combine w/ comprehensive assessment (listed just below) when performed on new 0 pt. evals) []  - 0 Comprehensive Assessment (HX, ROS, Risk Assessments, Wounds Hx, etc.) ASSESSMENTS - Wound and Skin Assessment / Reassessment []  - Dermatologic / Skin Assessment (not related to wound area) 0 ASSESSMENTS - Ostomy and/or Continence Assessment and Care []  - Incontinence Assessment and Management 0 []  - 0 Ostomy Care Assessment and Management (repouching, etc.) PROCESS - Coordination of Care []  - Simple Patient / Family Education for ongoing care 0 []  - 0 Complex (extensive) Patient / Family Education for ongoing care []  - 0 Staff obtains Programmer, systems, Records, Test Results / Process Orders []  - 0 Staff telephones HHA, Nursing Homes / Clarify orders / etc []  - 0 Routine Transfer to another Facility (non-emergent condition) []  - 0 Routine Hospital Admission (non-emergent condition) []  - 0 New Admissions / Biomedical engineer / Ordering NPWT, Apligraf, etc. []  - 0 Emergency Hospital Admission (emergent condition) PROCESS - Special Needs []  - Pediatric / Minor Patient Management 0 []  - 0 Isolation Patient Management []  - 0 Hearing / Language / Visual special needs []  - 0 Assessment of Community assistance (transportation, D/C planning, etc.) []  - 0 Additional assistance / Altered mentation []  - 0 Support Surface(s) Assessment (bed, cushion, seat, etc.) INTERVENTIONS - Miscellaneous []  - External ear exam 0 []  - 0 Patient Transfer (multiple staff / Civil Service fast streamer / Similar devices) []  - 0 Simple Staple / Suture removal (25 or less) []  - 0 Complex Staple / Suture removal (26 or more) []  - 0 Hypo/Hyperglycemic Management (do not check if billed separately) []  - 0 Ankle / Brachial Index (ABI) - do not check if billed separately Has the patient been seen at the hospital within the last three years: Yes Total Score: 0 Level Of Care: ____ [name] ([id number]) Electronic  Signature(s) Signed: 04/11/2021 10:13:57 AM By: Donnamarie Poag Entered By: Donnamarie Poag on 04/11/2021 08:26:29 Goren, [name] ([id number]) -------------------------------------------------------------------------------- Compression Therapy Details Patient Name: [name]  Date of Service: 04/11/2021 8:00 AM Medical Record Number: [id number] Patient Account Number: [id number] Date of Birth/Sex: Sep 15, 1980 (40 y.o. M) Treating RN: Donnamarie Poag Primary Care Kaidance Pantoja: [name] Other Clinician: Referring Darinda [place]: [name] Treating Parveen Freehling/Extender: Yaakov Guthrie in Treatment: 227 Compression Therapy Performed for Wound Assessment: Wound #1 Left,Lateral Lower Leg Performed By: Junius Argyle, RN Compression Type: Four Layer Electronic Signature(s) Signed: 04/11/2021 10:13:57 AM By: Donnamarie Poag Entered By: Donnamarie Poag on 04/11/2021 08:25:47 [name] ([id number]) -------------------------------------------------------------------------------- Encounter Discharge Information Details Patient Name: [name] Date of Service: 04/11/2021 8:00 AM Medical Record Number: [id number] Patient Account Number: [id number] Date of Birth/Sex: 01/26/1981 (40 y.o. M) Treating RN: Donnamarie Poag Primary Care Chanceler Pullin: [name] Other Clinician: Referring Kitana Gage: [name] Treating Daylen Hack/Extender: Yaakov Guthrie in Treatment: 227 Encounter Discharge Information Items Discharge Condition: Stable Ambulatory Status: Ambulatory Discharge Destination: Home Transportation: Private Auto Accompanied By: self Schedule Follow-up Appointment: Yes Clinical Summary of Care: Electronic Signature(s) Signed: 04/11/2021 10:13:57 AM By: Donnamarie Poag Entered By: Donnamarie Poag on 04/11/2021 37:16:96 [name] ([id number]) -------------------------------------------------------------------------------- Wound Assessment Details Patient Name: [name] Date of  Service: 04/11/2021 8:00 AM Medical Record Number: [id number] Patient Account Number: [id number] Date of Birth/Sex: 12-03-1980 (40 y.o. M) Treating RN: Donnamarie Poag Primary Care Francesco Provencal: [name] Other Clinician: Referring Shriyans Kuenzi: [name] Treating Mylan Schwarz/Extender: Yaakov Guthrie in Treatment: 227 Wound Status Wound Number: 1 Primary Etiology: Pyoderma Wound Location: Left, Lateral Lower Leg Wound Status: Open Wounding Event: Gradually Appeared Comorbid History: Sleep Apnea, Hypertension, Colitis Date Acquired: 11/18/2015 Weeks Of Treatment: 227 Clustered Wound: No Wound Measurements Length: (cm) 5.4 Width: (cm) 2.2 Depth: (cm) 0.3 Area: (cm) 9.331 Volume: (cm) 2.799 % Reduction in Area: -90.1% % Reduction in Volume: 28.7% Wound Description Classification: Full Thickness With Exposed Support Structures Wound Margin: Thickened Exudate Amount: Small Exudate Type: Serosanguineous Exudate Color: red, brown Foul Odor After Cleansing: No Slough/Fibrino Yes Wound Bed Granulation Amount: Large (67-100%) Exposed Structure Granulation Quality: Red, Pink Fat Layer (Subcutaneous Tissue) Exposed: Yes Necrotic Amount: Small (1-33%) Muscle Exposed: No Necrotic Quality: Adherent Slough Treatment Notes Wound #1 (Lower Leg) Wound Laterality: Left, Lateral Cleanser Wound Cleanser Discharge Instruction: Wash your hands with soap and water. Remove old dressing, discard into plastic bag and place into trash. Cleanse the wound with Wound Cleanser prior to applying a clean dressing using gauze sponges, not tissues or cotton balls. Do not scrub or use excessive force. Pat dry using gauze sponges, not tissue or cotton balls. Peri-Wound Care Moisturizing Lotion Discharge Instruction: Suggestions: Theraderm, Eucerin, Cetaphil, or patient preference. Topical Clobetasol Propionate ointment 0.05%, 60 (g) tube Discharge Instruction: Apply to 7 o'clock depth Primary  Dressing Hydrofera Blue Ready Transfer Foam, 4x5 (in/in) Discharge Instruction: Apply Hydrofera Blue Ready to wound bed as directed Secondary Dressing Xtrasorb Medium 4x5 (in/in) Discharge Instruction: Apply to wound as directed. Do not cut. NEYLAND, PETTENGILL ([id number]) Secured With Compression Wrap Medichoice 4 layer Compression System, 35-40 mmHG Discharge Instruction: Apply multi-layer wrap as directed. Compression Stockings Add-Ons Electronic Signature(s) Signed: 04/11/2021 10:13:57 AM By: Donnamarie Poag Entered ByDonnamarie Poag on 04/11/2021 08:25:32

## 2021-04-14 ENCOUNTER — Other Ambulatory Visit: Payer: Self-pay | Admitting: Primary Care

## 2021-04-14 DIAGNOSIS — E119 Type 2 diabetes mellitus without complications: Secondary | ICD-10-CM

## 2021-04-14 NOTE — Telephone Encounter (Signed)
This patient is overdue for diabetes follow up and must be seen for further refills of his medication.  Please schedule, let me know when this has been done.

## 2021-04-16 ENCOUNTER — Other Ambulatory Visit: Payer: Self-pay

## 2021-04-16 ENCOUNTER — Encounter: Payer: 59 | Admitting: Physician Assistant

## 2021-04-16 DIAGNOSIS — E11622 Type 2 diabetes mellitus with other skin ulcer: Secondary | ICD-10-CM | POA: Diagnosis not present

## 2021-04-16 NOTE — Progress Notes (Addendum)
MALCOME, AMBROCIO ([id number]) Visit Report for 04/16/2021 Chief Complaint Document Details Patient Name: [name], [name] Date of Service: 04/16/2021 8:15 AM Medical Record Number: [id number] Patient Account Number: [id number] Date of Birth/Sex: 1980-11-25 (40 y.o. 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 M) Treating RN: [name] Primary Care Provider: [name] Other Clinician: Referring Provider: [name] Treating Provider/Extender: [name] Cliche in Treatment: 227 History of Present Illness HPI Description: 12/04/16; 40 year old man who comes into the clinic today for review of a wound on the posterior left calf. He tells me that is been there for about a year. He is not a diabetic he does smoke half a pack per day. He was seen in the ER on 11/20/16 felt to have cellulitis around the wound and was given clindamycin. An x-ray did not show osteomyelitis. The patient initially tells me that he has a milk allergy that sets off a pruritic itching rash on his lower legs which she scratches incessantly and he thinks that's what may have set up the wound. He has been using various topical antibiotics and ointments without any effect. He works in a [employer] and is on his feet all day. He does not have a prior history  of wounds however he does have the rash on both lower legs the right arm and the ventral aspect of his left arm. These are excoriations and clearly have had scratching however there are of macular looking areas on both legs including a substantial larger area on the right leg. This does not have an underlying open area. There is no blistering. The patient tells me that 2 years ago in [place] in response to the rash on his legs he saw a dermatologist who told him he had a condition which may be pyoderma gangrenosum although I may be putting words into his mouth. He seemed to recognize this. On further questioning he admits to a 5 year history of quiesced. ulcerative colitis. He is not in any treatment for this. He's had no recent travel 12/11/16; the patient arrives today with his wound and roughly the same condition we've been using silver alginate this is a deep punched out wound with some surrounding erythema but no tenderness. Biopsy I did did not show confirmed pyoderma gangrenosum suggested nonspecific inflammation and vasculitis but does not provide an actual description of what was seen by the pathologist. I'm really not able to understand this We have also received information from the patient's dermatologist in [place] notes from April 2016. This was a doctor Agarwal-antal. 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 He was prescribed topical steroid high potency under occlusion which helped but at this point the patient did not have a deep punched out wound. 12/18/16; the patient's wound is larger in terms of surface area however this surface looks better and there is less depth. The surrounding erythema also is better. The patient states that the wrap we put on came off 2 days ago when he has been using his compression stockings.  He we are in the process of getting a dermatology consult. 12/26/16 on evaluation today patient's left lower extremity wound shows evidence of infection with  surrounding erythema noted. He has been tolerating the dressing changes but states that he has noted more discomfort. There is a larger area of erythema surrounding the wound. No fevers, chills, nausea, or vomiting noted at this time. With that being said the wound still does have slough covering the surface. He is not allergic to any medication that he is aware of at this point. In regard to his right lower extremity he had several regions that are erythematous and pruritic he wonders if there's anything we can do to help that. 01/02/17 I reviewed patient's wound culture which was obtained his visit last week. He was placed on doxycycline at that point. Unfortunately that does not appear to be an antibiotic that would likely help with the situation however the pseudomonas noted on culture is sensitive to Cipro. Also unfortunately patient's wound seems to have a large compared to last week's evaluation. Not severely so but there are definitely increased measurements in general. He is continuing to have discomfort as well he writes this to be a seven out of 10. In fact he would prefer me not to perform any debridement today due to the fact that he is having discomfort and considering he has an active infection on the little reluctant to do so anyway. No fevers, chills, nausea, or vomiting noted at this time. 01/08/17; patient seems dermatology on September 5. I suspect dermatology will want the slides from the biopsy I did sent to their pathologist. I'm not sure if there is a way we can expedite that. In any case the culture I did before I left on vacation 3 weeks ago showed Pseudomonas he was given 10 days of Cipro and per her description of her intake nurses is actually somewhat better this week although the wound is quite a bit bigger than I remember the last time I saw this. He still has 3 more days of Cipro 01/21/17; dermatology appointment tomorrow. He has completed the ciprofloxacin for Pseudomonas.  Surface of the wound looks better however he is had some deterioration in the lesions on his right leg. Meantime the left lateral leg wound we will continue with sample 01/29/17; patient had his dermatology appointment but I can't yet see that note. He is completed his antibiotics. The wound is more superficial but considerably larger in circumferential area than when he came in. This is in his left lateral calf. He also has swollen erythematous areas with superficial wounds on the right leg and small papular areas on both arms. There apparently areas in her his upper thighs and buttocks I did not look at those. Dermatology biopsied the right leg. Hopefully will have their input next week. 02/05/17; patient went back to see his dermatologist who told him that he had a "scratching problem" as well as staph. He is now on a 30 day course of doxycycline and I believe she gave him triamcinolone cream to the right leg areas to help with the itching [not exactly sure but probably triamcinolone]. She apparently looked at the left lateral leg wound although this was not rebiopsied and I think felt to be ultimately part of the same pathogenesis. He is using sample border foam and changing nevus himself. He now has a new open area on the right posterior leg which was his biopsy site I don't have any of the dermatology notes  02/12/17; we put the patient in compression last week with SANTYL to the wound on the left leg and the biopsy. Edema is much better and the depth of the wound is now at level of skin. Area is still the same oBiopsy site on the right lateral leg we've also been using santyl with a border foam dressing and he is changing this himself. 02/19/17; Using silver alginate started last week to both the substantial left leg wound and the biopsy site on the right wound. He is tolerating compression well. Has a an appointment with his primary M.D. tomorrow wondering about diuretics although I'm wondering if  the edema problem is actually lymphedema 02/26/17; the patient has been to see his primary doctor Dr. Jerrel Ivory at [facility] our primary care. She started him on Lasix 20 mg and this seems to have helped with the edema. However we are not making substantial change with the left lateral calf wound and inflammation. The biopsy site on the right leg also looks stable but not really all that different. 03/12/17; the patient has been to see vein and vascular Dr. Lucky Cowboy. He has had venous reflux studies I have not reviewed these. I did get a call from his dermatology office. They felt that he might have pathergy based on their biopsy on his right leg which led them to look at the slides of FLEMON, KELTY ([id number]) the biopsy I did on the left leg and they wonder whether this represents pyoderma gangrenosum which was the original supposition in a man with ulcerative colitis albeit inactive for many years. They therefore recommended clobetasol and tetracycline i.e. aggressive treatment for possible pyoderma gangrenosum. 03/26/17; apparently the patient just had reflux studies not an appointment with Dr. dew. She arrives in clinic today having applied clobetasol for 2-3 weeks. He notes over the last 2-3 days excessive drainage having to change the dressing 3-4 times a day and also expanding erythema. 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 Lillia Carmel at [facility] dermatology locally who kindly saw him at my request. 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 My concern was the degree of erythema medially extending from his left leg wound which was either inflammation from pyoderma or cellulitis. I  put him on Augmentin however culture of the wound showed Pseudomonas which is quinolone sensitive. I really don't believe he has cellulitis however in view of everything I will continue and give him a course of Cipro. He is also on doxycycline as an immune modulator for the pyoderma. In addition to his original wound on the left lateral leg with surrounding erythema he has a wound on the right posterior calf which was an original biopsy site done by dermatology. 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 Saw Dr. Lillia Carmel yesterday. He has been using topical antibiotics to both wound areas his original wound on the left and the biopsies/pathergy area on the right. There is definitely some improvement in the inflammation around the wound on the right although the patient states he has increasing sensitivity of the wounds. He is on prednisone 60 and doxycycline 1 as prescribed by Dr. Lillia Carmel. He is covering the topical antibiotic with gauze  and putting this in his own compression stocks and changing this daily. He states that Dr. Lottie Rater did a culture of the left leg wound yesterday 05/07/17; pyoderma gangrenosum. The patient saw Dr. Lillia Carmel yesterday and has a follow-up with her in one month. He is still using topical antibiotics to both wounds although he can't recall exactly what type. He is still on prednisone 60 mg. Dr. Lillia Carmel stated that the doxycycline could stop if we were in agreement. 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 The right medial leg was induced by biopsy/pathergy. The area on the right is essentially healed. 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 The right medial leg has closed and remains closed. He is still on  prednisone 60. oHe tells me he missed his last dermatology appointment with Dr. Lillia Carmel but will make another appointment. He reports that her blood sugar at a recent screen in [place] was high 200's. He was 180 today. 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 I think he is going to require still much longer prednisone perhaps another 3 months before attempting to taper. In the meantime his wound is a lot better. Smaller. He is cleaning this off daily and applying topical antibiotics. When he was last in the clinic I thought about changing to [facility] and actually put in a couple of calls to dermatology although probably not during their business hours. 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 He continues on oral prednisone. He has been using triple antibiotic ointment. There is surface debris and we will transition to [facility] and have him return in 2 weeks. He has lost 30 pounds since his last appointment with lifestyle modification. He may benefit from topical steroid cream for treatment this can be considered at a later date. 08/22/17 on evaluation today patient appears to actually be doing rather well in regard to his left lateral lower extremity ulcer. He has actually been managed by Dr. Dellia Nims most recently. Patient is currently on oral steroids at this time. This seems to have been of benefit for him. Nonetheless his last visit was actually with Leah on 08/06/17. Currently he is not utilizing any topical steroid creams although this could be of benefit as well. No fevers, chills, nausea, or vomiting noted at this time. 09/05/17 on evaluation today patient appears to be doing better in regard to his left lateral lower extremity ulcer. He has been tolerating the dressing changes without complication. He is using Santyl with good effect. Overall I'm very  pleased with how things are standing at this point. Patient likewise is happy that this is doing better. 09/19/17 on evaluation today patient actually appears to be doing rather well in regard to his left lateral lower extremity ulcer. 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 He's not having any significant pain. 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 He has been tolerating the Santyl without complication and in general I feel like he's making good progress. 10/17/17 on evaluation today patient  appears to be doing very well in regard to his left lateral lower surety ulcer. He has been tolerating the dressing changes without complication. There does not appear to be any evidence of infection he's alternating the Santyl and the triple antibiotic ointment every other day this seems to be doing well for him. 11/03/17 on evaluation today patient appears to be doing very well in regard to his left lateral lower extremity ulcer. He is been tolerating the dressing changes without complication which is good news. Fortunately there does not appear to be any evidence of infection which is also great news. Overall is doing excellent they are starting to taper down on the prednisone is down to 40 mg at this point it also started topical clobetasol for him. 11/17/17 on evaluation today patient appears to be doing well in regard to his left lateral lower surety ulcer. He's been tolerating the dressing changes without complication. He does note that he is having no pain, no excessive drainage or discharge, and overall he feels like things are going about how he would expect and hope they would. Overall he seems to have no evidence of infection at this time in my opinion which is good news. 12/04/17-He is seen in follow-up evaluation  for right lateral lower extremity ulcer. He has been applying topical steroid cream. Today's measurement show slight increase in size. Over the next 2 weeks we will transition to every other day Santyl and steroid cream. He has been encouraged to monitor for changes and notify clinic with any concerns 12/15/17 on evaluation today patient's left lateral motion the ulcer and fortunately is doing worse again at this point. This just since last week to this week has close to doubled in size according to the patient. I did not seeing last week's I do not have a visual to compare this to in our system was also down so we do not have all the charts and at this point. Nonetheless it does have me somewhat concerned in regard to the fact that again he was worried enough about it he has contact the dermatology that placed them back on the full strength, 50 mg a day of the prednisone that he was taken previous. He continues to alternate using clobetasol along with Santyl at this point. He is obviously somewhat frustrated. 12/22/17 on evaluation today patient appears to be doing a little worse compared to last evaluation. Unfortunately the wound is a little deeper and slightly larger than the last week's evaluation. With that being said he has made some progress in regard to the irritation surrounding at this time unfortunately despite that progress that's been made he still has a significant issue going on here. 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 JOAL, EAKLE ([id number]) For that reason I discussed with him today the possibility of perform a wound culture to ensure there's nothing overtly infected. 01/06/18 on evaluation today patient's wound is larger and deeper than previously evaluated. With that being said it did appear that his wound was infected after  my last evaluation with him. Subsequently I did end up prescribing a prescription for Bactrim DS which she has been taking and having no complication with. Fortunately there does not appear to be any evidence of infection at this point in time as far as anything spreading, no want to touch, and overall I feel like  things are showing signs of improvement. 01/13/18 on evaluation today patient appears to be even a little larger and deeper than last time. There still muscle exposed in the base of the wound. Nonetheless he does appear to be less erythematous I do believe inflammation is calming down also believe the infection looks like it's probably resolved at this time based on what I'm seeing. No fevers, chills, nausea, or vomiting noted at this time. 01/30/18 on evaluation today patient actually appears to visually look better for the most part. Unfortunately those visually this looks better he does seem to potentially have what may be an abscess in the muscle that has been noted in the central portion of the wound. This is the first time that I have noted what appears to be fluctuance in the central portion of the muscle. With that being said I'm somewhat more concerned about the fact that this might indicate an abscess formation at this location. I do believe that an ultrasound would be appropriate. This is likely something we need to try to do as soon as possible. He has been switch to mupirocin ointment and he is no longer using the steroid ointment as prescribed by dermatology he sees them again next week he's been decreased from 60 to 40 mg of prednisone. 03/09/18 on evaluation today patient actually appears to be doing a little better compared to last time I saw him. There's not as much erythema surrounding the wound itself. He I did review his most recent infectious disease note which was dated 02/24/18. He saw Dr. Michel Bickers in [place]. 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 Patient is now off of antibiotics and they are continually observing this. There seems to be no change in the past two weeks in my pinion based on what the patient says and what I see today compared to what Dr. Megan Salon likely saw two weeks ago. No fevers, chills, nausea, or vomiting noted at this time. 03/23/18 on evaluation today patient's wound actually appears to be showing signs of improvement which is good news. He is currently still on the Dapsone. He is also working on tapering the prednisone to get off of this and Dr. Lottie Rater is working with him in this regard. Nonetheless overall I feel like the wound is doing well it does appear based on the infectious disease note that I reviewed from Dr. 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 I did also review the results of his ultrasound of left lower extremity which revealed there was a dentist tissue in the base of the wound without an abscess noted. 04/06/18 on evaluation today the patient's left lateral lower extremity ulcer actually appears to be doing fairly well which is excellent news. There does not appear to be any evidence of infection at this time which is also great news. Overall he still does have a significantly large ulceration although little by little he seems to be making progress. He is down to 10 mg a day of the prednisone. 04/20/18 on evaluation today patient actually appears to be doing excellent at this time in regard to his left lower extremity ulcer. He's making signs of good progress unfortunately this is taking much longer than we would really like to see but nonetheless he is  making progress. Fortunately there does not appear to be any evidence of infection at this time. No fevers, chills, nausea, or vomiting noted at this time.  The patient has not been using the Santyl due to the cost he hadn't got in this field yet. He's mainly been using the antibiotic ointment topically. Subsequently he also tells me that he really has not been scrubbing in the shower I think this would be helpful again as I told him it doesn't have to be anything too aggressive to even make it believe just enough to keep it free of some of the loose slough and biofilm on the wound surface. 05/11/18 on evaluation today patient's wound appears to be making slow but sure progress in regard to the left lateral lower extremity ulcer. He is been tolerating the dressing changes without complication. Fortunately there does not appear to be any evidence of infection at this time. He is still just using triple antibiotic ointment along with clobetasol occasionally over the area. He never got the Santyl and really does not seem to intend to in my pinion. 06/01/18 on evaluation today patient appears to be doing a little better in regard to his left lateral lower extremity ulcer. He states that overall he does not feel like he is doing as well with the Dapsone as he did with the prednisone. Nonetheless he sees his dermatologist later today and is gonna talk to them about the possibility of going back on the prednisone. Overall again I believe that the wound would be better if you would utilize Santyl but he really does not seem to be interested in going back to the [place] at this point. He has been using triple antibiotic ointment. 06/15/18 on evaluation today patient's wound actually appears to be doing about the same at this point. Fortunately there is no signs of infection at this time. He has made slight improvements although he continues to not really want to clean the wound bed at this point. He states that he just doesn't mess with it he doesn't want to cause any problems with everything else he has going on. He has been on medication, antibiotics as prescribed  by his dermatologist, for a staff infection of his lower extremities which is really drying out now and looking much better he tells me. Fortunately there is no sign of overall infection. 06/29/18 on evaluation today patient appears to be doing well in regard to his left lateral lower surety ulcer all things considering. Fortunately his staff infection seems to be greatly improved compared to previous. He has no signs of infection and this is drying up quite nicely. He is still the doxycycline for this is no longer on cental, Dapsone, or any of the other medications. His dermatologist has recommended possibility of an infusion but right now he does not want to proceed with that. 07/13/18 on evaluation today patient appears to be doing about the same in regard to his left lateral lower surety ulcer. Fortunately there's no signs of infection at this time which is great news. Unfortunately he still builds up a significant amount of Slough/biofilm of the surface of the wound he still is not really cleaning this as he should be appropriately. Again I'm able to easily with saline and gauze remove the majority of this on the surface which if you would do this at home would likely be a dramatic improvement for him as far as getting the area to improve. Nonetheless overall I still feel like he  is making progress is just very slow. I think Santyl will be of benefit for him as well. Still he has not gotten this as of this point. 07/27/18 on evaluation today patient actually appears to be doing little worse in regards of the erythema around the periwound region of the wound he also tells me that he's been having more drainage currently compared to what he was experiencing last time I saw him. He states not quite as bad as what he had because this was infected previously but nonetheless is still appears to be doing poorly. Fortunately there is no evidence of systemic infection at this point. The patient tells me that  he is not going to be able to afford the Santyl. He is still waiting to hear about the infusion therapy with his dermatologist. Apparently she wants an updated colonoscopy first. 08/10/18 on evaluation today patient appears to be doing better in regard to his left lateral lower extremity ulcer. Fortunately he is showing signs of improvement in this regard he's actually been approved for Remicade infusion's as well although this has not been scheduled as of yet. Fortunately there's no signs of active infection at this time in regard to the wound although he is having some issues with infection of the right lower extremity is been seen as dermatologist for this. Fortunately they are definitely still working with him trying to keep things under control. RICHARDO, POPOFF ([id number]) 09/07/18 on evaluation today patient is actually doing rather well in regard to his left lateral lower extremity ulcer. He notes these actually having some hair grow back on his extremity which is something he has not seen in years. He also tells me that the pain is really not giving them any trouble at this time which is also good news overall she is very pleased with the progress he's using a combination of the mupirocin along with the probate is all mixed. 09/21/18 on evaluation today patient actually appears to be doing fairly well all things considered in regard to his looks from the ulcer. He's been tolerating the dressing changes without complication. Fortunately there's no signs of active infection at this time which is good news he is still on all antibiotics or prevention of the staff infection. He has been on prednisone for time although he states it is gonna contact his dermatologist and see if she put them on a short course due to some irritation that he has going on currently. Fortunately there's no evidence of any overall worsening this is going very slow I think cental would be something that would be helpful for him  although he states that $50 for tube is quite expensive. He therefore is not willing to get that at this point. 10/06/18 on evaluation today patient actually appears to be doing decently well in regard to his left lateral leg ulcer. He's been tolerating the dressing changes without complication. Fortunately there's no signs of active infection at this time. Overall I'm actually rather pleased with the progress he's making although it's slow he doesn't show any signs of infection and he does seem to be making some improvement. I do believe that he may need a switch up and dressings to try to help this to heal more appropriately and quickly. 10/19/18 on evaluation today patient actually appears to be doing better in regard to his left lateral lower extremity ulcer. This is shown signs of having much less Slough buildup at this point due to the fact he has been using  the Santyl. Obviously this is very good news. The overall size of the wound is not dramatically smaller but again the appearance is. 11/02/18 on evaluation today patient actually appears to be doing quite well in regard to his lower [place] ulcer. A lot of the skin around the ulcer is actually somewhat irritating at this point this seems to be more due to the dressing causing irritation from the adhesive that anything else. Fortunately there is no signs of active infection at this time. 11/24/18 on evaluation today patient appears to be doing a little worse in regard to his overall appearance of his lower extremity ulcer. There's more erythema and warmth around the wound unfortunately. He is currently on doxycycline which he has been on for some time. With that being said I'm not sure that seems to be helping with what appears to possibly be an acute cellulitis with regard to his left lower extremity ulcer. No fevers, chills, nausea, or vomiting noted at this time. 12/08/18 on evaluation today patient's wounds actually appears to be doing  significantly better compared to his last evaluation. He has been using Santyl along with alternating tripling about appointment as well as the steroid cream seems to be doing quite well and the wound is showing signs of improvement which is excellent news. Fortunately there's no evidence of infection and in fact his culture came back negative with only normal skin flora noted. 12/21/2018 upon evaluation today patient actually appears to be doing excellent with regard to his ulcer. This is actually the best that I have seen it since have been helping to take care of him. It is both smaller as well as less slough noted on the surface of the wound and seems to be showing signs of good improvement with new skin growing from the edges. He has been using just the triamcinolone he does wonder if he can get a refill of that ointment today. 01/04/2019 upon evaluation today patient actually appears to be doing well with regard to his left lateral lower extremity ulcer. With that being said it does not appear to be that he is doing quite as well as last time as far as progression is concerned. There does not appear to be any signs of infection or significant irritation which is good news. With that being said I do believe that he may benefit from switching to a collagen based dressing based on how clean The wound appears. 01/18/2019 on evaluation today patient actually appears to be doing well with regard to his wound on the left lower extremity. He is not made a lot of progress compared to where we were previous but nonetheless does seem to be doing okay at this time which is good news. There is no signs of active infection which is also good news. My only concern currently is I do wish we can get him into utilizing the collagen dressing his insurance would not pay for the supplies that we ordered although it appears that he may be able to order this through his supply company that he typically utilizes. This is  [employer]. Nonetheless he did try to order it during the office visit today and it appears this did go through. We will see if he can get that it is a different brand but nonetheless he has collagen and I do think will be beneficial. 02/01/2019 on evaluation today patient actually appears to be doing a little worse today in regard to the overall size of his wounds. Fortunately there  is no signs of active infection at this time. That is visually. Nonetheless when this is happened before it was due to infection. For that reason were somewhat concerned about that this time as well. 02/08/2019 on evaluation today patient unfortunately appears to be doing slightly worse with regard to his wound upon evaluation today. Is measuring a little deeper and a little larger unfortunately. I am not really sure exactly what is causing this to enlarge he actually did see his dermatologist she is going to see about initiating Humira for him. Subsequently she also did do steroid injections into the wound itself in the periphery. Nonetheless still nonetheless he seems to be getting a little bit larger he is gone back to just using the steroid cream topically which I think is appropriate. I would say hold off on the collagen for the time being is definitely a good thing to do. Based on the culture results which we finally did get the final result back regarding it shows staph as the bacteria noted again that can be a normal skin bacteria based on the fact however he is having increased drainage and worsening of the wound measurement wise I would go ahead and place him on an antibiotic today I do believe for this. 02/15/2019 on evaluation today patient actually appears to be doing somewhat better in regard to his ulcer. There is no signs of worsening at this time I did review his culture results which showed evidence of Staphylococcus aureus but not MRSA. Again this could just be more related to the normal skin bacteria  although he states the drainage has slowed down quite a bit he may have had a mild infection not just colonization. And was much smaller and then since around10/04/2019 on evaluation today patient appears to be doing unfortunately worse as far as the size of the wound. I really feel like that this is steadily getting larger again it had been doing excellent right at the beginning of September we have seen a steady increase in the area of the wound it is almost 2-1/2 times the size it was on September 1. Obviously this is a bad trend this is not wanting to see. For that reason we went back to using just the topical triamcinolone cream which does seem to help with inflammation. I checked him for bacteria by way of culture and nothing showed positive there. I am considering giving him a short course of a tapering steroid Dosepak today to see if that is can be beneficial for him. The patient is in agreement with giving that a try. 03/08/2019 on evaluation today patient appears to be doing very well in comparison to last evaluation with regard to his lower extremity ulcer. This is showing signs of less inflammation and actually measuring slightly smaller compared to last time every other week over the past month and a half he has been measuring larger larger larger. Nonetheless I do believe that the issue has been inflammation the prednisone does seem to [facility], Oleg ([id number]) have been beneficial for him which is good news. No fevers, chills, nausea, vomiting, or diarrhea. 03/22/2019 on evaluation today patient appears to be doing about the same with regard to his leg ulcer. He has been tolerating the dressing changes without complication. With that being said the wound seems to be mostly arrested at its current size but really is not making any progress except for when we prescribed the prednisone. He did show some signs of dropping as far as  the overall size of the wound during that interval week.  Nonetheless this is something he is not on long-term at this point and unfortunately I think he is getting need either this or else the Humira which his dermatologist has discussed try to get approval for. With that being said he will be seeing his dermatologist on the 11th of this month that is November. 04/19/2019 on evaluation today patient appears to be doing really about the same the wound is measuring slightly larger compared to last time I saw him. He has not been into the office since November 2 due to the fact that he unfortunately had Covid as that his entire family. He tells me that it was rough but they did pull-through and he seems to be doing much better. Fortunately there is no signs of active infection at this time. No fevers, chills, nausea, vomiting, or diarrhea. 05/10/2019 on evaluation today patient unfortunately appears to be doing significantly worse as compared to last time I saw him. He does tell me that he has had his first dose of Humira and actually is scheduled to get the next one in the upcoming week. With that being said he tells me also that in the past several days he has been having a lot of issues with green drainage she showed me a picture this is more blue-green in color. He is also been having issues with increased sloughy buildup and the wound does appear to be larger today. Obviously this is not the direction that we want everything to take based on the starting of his Humira. Nonetheless I think this is definitely a result of likely infection and to be honest I think this is probably Pseudomonas causing the infection based on what I am seeing. 05/24/2019 on evaluation today patient unfortunately appears to be doing significantly worse compared to his prior evaluation with me 2 weeks ago. I did review his culture results which showed that he does have Staph aureus as well as Pseudomonas noted on the culture. Nonetheless the Levaquin that I prescribed for him does  not appear to have been appropriate and in fact he tells me he is no longer experiencing the green drainage and discharge that he had at the last visit. Fortunately there is no signs of active infection at this time which is good news although the wound has significantly worsened it in fact is much deeper than it was previous. We have been utilizing up to this point triamcinolone ointment as the prescription topical of choice but at this time I really feel like that the wound is getting need to be packed in order to appropriately manage this due to the deeper nature of the wound. Therefore something along the lines of an alginate dressing may be more appropriate. 05/31/2019 upon inspection today patient's wound actually showed signs of doing poorly at this point. Unfortunately he just does not seem to be making any good progress despite what we have tried. He actually did go ahead and pick up the Cipro and start taking that as he was noticing more green drainage he had previously completed the Levaquin that I prescribed for him as well. Nonetheless he missed his appointment for the seventh last week on Wednesday with the wound care center and [facility] where his dermatologist referred him. Obviously I do think a second opinion would be helpful at this point especially in light of the fact that the patient seems to be doing so poorly despite the fact  that we have tried everything that I really know how at this point. The only thing that ever seems to have helped him in the past is when he was on high doses of continual steroids that did seem to make a difference for him. 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 06/07/2019 upon evaluation today patient unfortunately appears to be doing worse yet again with regard to his wound. In fact I am  starting to question whether or not he may have a fluid pocket in the muscle at this point based on the bulging and the soft appearance to the central portion of the muscle area. There is not anything draining from the muscle itself at this time which is good news but nonetheless the wound is expanding. I am not really seeing any results of the Humira as far as overall wound progression based on what I am seeing at this point. The patient has been referred for second opinion with regard to his wound to the [facility] wound care center by his dermatologist which I definitely am not in opposition to. Unfortunately we tried multiple dressings in the past including collagen, alginate, and at one point even Hydrofera Blue. With that being said he is never really used it for any significant amount of time due to the fact that he often complains of pain associated with these dressings and then will go back to either using the Santyl which she has done intermittently or more frequently the triamcinolone. He is also using his own compression stockings. We have wrapped him in the past but again that was something else that he really was not a big fan of. 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 He seemed to do much better when he was on the steroids although honestly I understand that the steroids are not necessarily the best medication to be on long-term obviously 06/14/2019 on evaluation today patient appears to be doing actually a little bit better with regard to the overall appearance with his leg. Unfortunately he does continue to have issues with what appears to be some fluid underneath the  muscle although he did see the wound specialty center at [facility] last week their main goals were to see about infusion therapy in place of the Humira as they feel like that is not quite strong enough. They also recommended that we continue with the treatment otherwise as we are they felt like that was appropriate and they are okay with him continuing to follow-up here with [place] in that regard. With that being said they are also sending him to the vein specialist there to see about vein stripping and if that would be of benefit for him. Subsequently they also did not really address whether or not an ultrasound of the muscle area to see if there is anything that needs to be addressed here would be appropriate or not. For that reason I discussed this with him last week I think we  may proceed down that road at this point. 06/21/2019 upon evaluation today patient's wound actually appears to be doing slightly better compared to previous evaluations. I do believe that he has made a difference with regard to the progression here with the use of oral steroids. Again in the past has been the only thing that is really calm things down. He does tell me that from [facility] is gotten a good news from there that there are no further vein stripping that is necessary at this point. I do not have that available for review today although the patient did relay this to me. He also did obtain and have the ultrasound of the wound completed which I did sign off on today. It does appear that there is no fluid collection under the muscle this is likely then just edematous tissue in general. That is also good news. Overall I still believe the inflammation is the main issue here. He did inquire about the possibility of a wound VAC again with the muscle protruding like it is I am not really sure whether the wound VAC is necessarily ideal or not. That is something we will have to consider although I do believe he may need compression wrapping to  try to help with edema control which could potentially be of benefit. 06/28/2019 on evaluation today patient appears to be doing slightly better measurement wise although this is not terribly smaller he least seems to be trending towards that direction. With that being said he still seems to have purulent drainage noted in the wound bed at this time. He has been on Levaquin followed by Cipro over the past month. Unfortunately he still seems to have some issues with active infection at this time. I did perform a culture last week in order to evaluate and see if indeed there was still anything going on. Subsequently the culture did come back showing Pseudomonas which is consistent with the drainage has been having which is blue-green in color. He also has had an odor that again was somewhat consistent with Pseudomonas as well. Long story short it appears that the culture showed an intermediate finding with regard to how well the Cipro will work for the Pseudomonas infection. Subsequently being that he does not seem to be clearing up and at best what we are doing is just keeping this at [place] I think he may need to see infectious disease to discuss IV antibiotic options. WALLY, SHEVCHENKO ([id number]) 07/05/2019 upon evaluation today patient appears to be doing okay in regard to his leg ulcer. He has been tolerating the dressing changes at this point without complication. Fortunately there is no signs of active infection at this time which is good news. No fevers, chills, nausea, vomiting, or diarrhea. With that being said he does have an appointment with infectious disease tomorrow and his primary care on Wednesday. Again the reason for the infectious disease referral was due to the fact that he did not seem to be fully resolving with the use of oral antibiotics and therefore we were thinking that IV antibiotic therapy may be necessary secondary to the fact that there was an intermediate finding for  how effective the Cipro may be. Nonetheless again he has been having a lot of purulent and even green drainage. Fortunately right now that seems to have calmed down over the past week with the reinitiation of the oral antibiotic. Nonetheless we will see what Dr. Megan Salon has to say. 07/12/2019 upon evaluation today patient appears to be  doing about the same at this point in regard to his left lower extremity ulcer. Fortunately there is no signs of active infection at this time which is good news I do believe the Levaquin has been beneficial I did review Dr. Hale Bogus note and to be honest I agree that the patient's leg does appear to be doing better currently. What we found in the past as he does not seem to really completely resolve he will stop the antibiotic and then subsequently things will revert back to having issues with blue-green drainage, increased pain, and overall worsening in general. Obviously that is the reason I sent him back to infectious disease. 07/19/2019 upon evaluation today patient appears to be doing roughly the same in size there is really no dramatic improvement. He has started back on the Levaquin at this point and though he seems to be doing okay he did still have a lot of blue/green drainage noted on evaluation today unfortunately. I think that this is still indicative more likely of a Pseudomonas infection as previously noted and again he does see Dr. Megan Salon in just a couple of days. I do not know that were really able to effectively clear this with just oral antibiotics alone based on what I am seeing currently. Nonetheless we are still continue to try to manage as best we can with regard to the patient and his wound. I do think the wrap was helpful in decreasing the edema which is excellent news. No fevers, chills, nausea, vomiting, or diarrhea. 07/26/2019 upon evaluation today patient appears to be doing slightly better with regard to the overall appearance of the muscle  there is no dark discoloration centrally. Fortunately there is no signs of active infection at this time. No fevers, chills, nausea, vomiting, or diarrhea. Patient's wound bed currently the patient did have an appointment with Dr. Megan Salon at infectious disease last week. With that being said Dr. Megan Salon the patient states was still somewhat hesitant about put him on any IV antibiotics he wanted [place] to repeat cultures today and then see where things go going forward. He does look like Dr. Megan Salon because of some improvement the patient did have with the Levaquin wanted [place] to see about repeating cultures. If it indeed grows the Pseudomonas again then he recommended a possibility of considering a PICC line placement and IV antibiotic therapy. He plans to see the patient back in 1 to 2 weeks. 08/02/2019 upon evaluation today patient appears to be doing poorly with regard to his left lower extremity. We did get the results of his culture back it shows that he is still showing evidence of Pseudomonas which is consistent with the purulent/blue-green drainage that he has currently. Subsequently the culture also shows that he now is showing resistance to the oral fluoroquinolones which is unfortunate as that was really the only thing to treat the infection prior. I do believe that he is looking like this is going require IV antibiotic therapy to get this under control. Fortunately there is no signs of systemic infection at this time which is good news. The patient does see Dr. Megan Salon tomorrow. 08/09/2019 upon evaluation today patient appears to be doing better with regard to his left lower extremity ulcer in regard to the overall appearance. He is currently on IV antibiotic therapy. As ordered by Dr. Megan Salon. Currently the patient is on ceftazidime which she is going to take for the next 2 weeks and then follow-up for 4 to 5-week appointment with Dr. Megan Salon.  The patient started this this past Friday  symptoms have not for a total of 3 days currently in full. 08/16/2019 upon evaluation today patient's wound actually does show muscle in the base of the wound but in general does appear to be much better as far as the overall evidence of infection is concerned. In fact I feel like this is for the most part cleared up he still on the IV antibiotics he has not completed the full course yet but I think he is doing much better which is excellent news. 08/23/2019 upon evaluation today patient appears to be doing about the same with regard to his wound at this point. He tells me that he still has pain unfortunately. Fortunately there is no evidence of systemic infection at this time which is great news. There is significant muscle protrusion. 09/13/19 upon evaluation today patient appears to be doing about the same in regard to his leg unfortunately. He still has a lot of drainage coming from the ulceration there is still muscle exposed. With that being said the patient's last wound culture still showed an intermediate finding with regard to the Pseudomonas he still having the bluish/green drainage as well. Overall I do not know that the wound has completely cleared of infection at this point. Fortunately there is no signs of active infection systemically at this point which is good news. 09/20/2019 upon evaluation today patient's wound actually appears to be doing about the same based on what I am seeing currently. I do not see any signs of systemic infection he still does have evidence of some local infection and drainage. He did see Dr. Megan Salon last week and Dr. Megan Salon states that he probably does need a different IV antibiotic although he does not want to put him on this until the patient begins the Remicade infusion which is actually scheduled for about 10 days out from today on 13 May. Following that time Dr. Megan Salon is good to see him back and then will evaluate the feasibility of starting him on the  IV antibiotic therapy once again at that point. I do not disagree with this plan I do believe as Dr. Megan Salon stated in his note that I reviewed today that the patient's issue is multifactorial with the pyoderma being 1 aspect of this that were hoping the Remicade will be helpful for her. In the meantime I think the gentamicin is, helping to keep things under decent okay control in regard to the ulcer. 09/27/2019 upon evaluation today patient appears to be doing about the same with regard to his wound still there is a lot of muscle exposure though he does have some hyper granulation tissue noted around the edge and actually some granulation tissue starting to form over the muscle which is actually good news. Fortunately there is no evidence of active infection which is also good news. His pain is less at this point. 5/21; this is a patient I have not seen in a long time. 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 He has a large wound on the left lateral leg with protruding muscle. He comes in the clinic today showing the same area on his left medial ankle. He says there is been a spot there for some time although we have not previously defined this. Today he has a clearly defined area with slight amount of skin breakdown surrounded by raised areas with a purplish hue in color. This is not painful he says it is irritated.  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 He still has muscle protruding from the lateral portion of his left leg but fortunately the new area that they were concerned about at his last visit does not appear to have opened at this point. He is currently on Remicade infusions and seems to be doing better in my opinion in fact the wound itself seems to be overall much better. The purplish discoloration that he did have seems to have resolved  and I think that is a good sign that hopefully the Remicade is doing its job. He does have some biofilm noted over the surface of the wound. 11/01/2019 on evaluation today patient's wound actually appears to be doing excellent at this time. Fortunately there is no evidence of active infection and overall I feel like he is making great progress. The Remicade seems to be due excellent job in my opinion. [name], [name] ([id number]) 11/08/19 evaluation today vision actually appears to be doing quite well with regard to his weight ulcer. He's been tolerating dressing changes without complication. Fortunately there is no evidence of infection. No fevers, chills, nausea, or vomiting noted at this time. Overall states that is having more itching than pain which is actually a good sign in my opinion. 12/13/2019 upon evaluation today patient appears to be doing well today with regard to his wound. He has been tolerating the dressing changes without complication. Fortunately there is no sign of active infection at this time. No fevers, chills, nausea, vomiting, or diarrhea. Overall I feel like the infusion therapy has been very beneficial for him. 01/06/2020 on evaluation today patient appears to be doing well with regard to his wound. This is measuring smaller and actually looks to be doing better. Fortunately there is no signs of active infection at this point. No fevers, chills, nausea, vomiting, or diarrhea. With that being said he does still have the blue-green drainage but this does not seem to be causing any significant issues currently. He has been using the gentamicin that does seem to be keeping things under decent control at this point. He goes later this morning for his next infusion therapy for the pyoderma which seems to also be very beneficial. 02/07/2020 on evaluation today patient appears to be doing about the same in regard to his wounds currently. Fortunately there is no signs of active infection  systemically he does still have evidence of local infection still using gentamicin. He also is showing some signs of improvement albeit slowly I do feel like we are making some progress here. 02/21/2020 upon evaluation today patient appears to be making some signs of improvement the wound is measuring a little bit smaller which is great news and overall I am very pleased with where he stands currently. He is going to be having infusion therapy treatment on the 15th of this month. Fortunately there is no signs of active infection at this time. 03/13/2020 I do believe patient's wound is actually showing some signs of improvement here which is great news. He has continue with the infusion therapy through rheumatology/dermatology at [facility]. That does seem to be beneficial. I still think he gets as much benefit from this as he did from the prednisone initially but nonetheless obviously this is less harsh on his body that the prednisone as far as they are concerned. 03/31/2020 on evaluation today patient's wound actually showing signs of some pretty good improvement in regard  to the overall appearance of the wound bed. There is still muscle exposed though he does have some epithelial growth around the edges of the wound. Fortunately there is no signs of active infection at this time. No fevers, chills, nausea, vomiting, or diarrhea. 04/24/2020 upon evaluation today patient appears to be doing about the same in regard to his leg ulcer. He has been tolerating the dressing changes without complication. Fortunately there is no signs of active infection at this time. No fevers, chills, nausea, vomiting, or diarrhea. With that being said he still has a lot of irritation from the bandaging around the edges of the wound. We did discuss today the possibility of a referral to plastic surgery. 05/22/2020 on evaluation today patient appears to be doing well with regard to his wounds all things considered. He has not been able  to get the Chantix apparently there is a recall nurse that I was unaware of put out by [employer] involuntarily. Nonetheless for now I am and I have to do some research into what may be the best option for him to help with quitting in regard to smoking and we discussed that today. 06/26/2020 upon evaluation today patient appears to be doing well with regard to his wound from the standpoint of infection I do not see any signs of infection at this point. With that being said unfortunately he is still continuing to have issues with muscle exposure and again he is not having a whole lot of new skin growth unfortunately. There does not appear to be any signs of active infection at this time. No fevers, chills, nausea, vomiting, or diarrhea. 07/10/2020 upon evaluation today patient appears to be doing a little bit more poorly currently compared to where he was previous. I am concerned currently about an active infection that may be getting worse especially in light of the increased size and tenderness of the wound bed. No fevers, chills, nausea, vomiting, or diarrhea. 07/24/2020 upon evaluation today patient appears to be doing poorly in regard to his leg ulcer. He has been tolerating the dressing changes without complication but unfortunately is having a lot of discomfort. 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 Subsequently I think he is going require possibly IV antibiotics to get this under control. I am very concerned about the severity of his infection and the amount of discomfort he is having. 07/31/2020 upon evaluation today patient appears to be doing about the same in regard to his leg wound. He did see Dr. Megan Salon and Dr. Megan Salon is actually going to start him on IV antibiotics. He goes for the PICC line tomorrow. With that being said there do not have that run for 2 weeks and then see how things are doing and depending on how he  is progressing they may extend that a little longer. Nonetheless I am glad this is getting ready to be in place and definitely feel it may help the patient. In the meantime is been using mainly triamcinolone to the wound bed has an anti-inflammatory. 08/07/2020 on evaluation today patient appears to be doing well with regard to his wound compared even last week. In the interim he has gotten the PICC line placed and overall this seems to be doing excellent. There does not appear to be any evidence of infection which is great news systemically although locally of course has had the infection this appears to be improving with the use of the antibiotics. 08/14/2020 upon  evaluation today patient's wound actually showing signs of excellent improvement. Overall the irritation has significantly improved the drainage is back down to more of a normal level and his pain is really pretty much nonexistent compared to what it was. Obviously I think that this is significantly improved secondary to the IV antibiotic therapy which has made all the difference in the world. Again he had a resistant form of Pseudomonas for which oral antibiotics just was not cutting it. Nonetheless I do think that still we need to consider the possibility of a surgical closure for this wound is been open so long and to be honest with muscle exposed I think this can be very hard to get this to close outside of this although definitely were still working to try to do what we can in that regard. 08/21/2020 upon evaluation today patient appears to be doing very well with regard to his wounds on the left lateral lower extremity/calf area. Fortunately there does not appear to be signs of active infection which is great news and overall very pleased with where things stand today. He is actually wrapping up his treatment with IV antibiotics tomorrow. After that we will see where things go from there. 08/28/2020 upon evaluation today patient appears  to be doing decently well with regard to his leg ulcer. There does not appear to be any signs of active infection which is great news and overall very pleased with where things stand today. No fevers, chills, nausea, vomiting, or diarrhea. 09/18/2020 upon evaluation today patient appears to be doing well with regard to his infection which I feel like is better. Unfortunately he is not doing as well with regard to the overall size of the wound which is not nearly as good at this point. I feel like that he may be having an issue here with the pyoderma being somewhat out of control. I think that he may benefit from potentially going back and talking to the dermatologist about NATHON, STEFANSKI ([id number]) what to do from the pyoderma standpoint. I am not certain if the infusions are helping nearly as much is what the prednisone did in the past. 10/02/2020 upon evaluation today patient appears to be doing well with regard to his leg ulcer. He did go to the [facility] nurse. Unfortunately they feel like there is a 10% chance that most that he would be able to heal and that the skin graft would take. Obviously this has led him to not be able to go down that path as far as treatment is concerned. Nonetheless he does seem to be doing a little bit better with the prednisone that I gave him last time. I think that he may need to discuss with dermatology the possibility of long-term prednisone as that seems to be what is most helpful for him to be perfectly honest. I am not sure the Remicade is really doing the job. 10/17/2020 upon evaluation today patient appears to be doing a little better in regard to his wound. In fact the case has been since we did the prednisone on May 2 for him that we have noticed a little bit of improvement each time we have seen a size wise as well as appearance wise as well as pain wise. I think the prednisone has had a greater effect then the infusion therapy has to be perfectly honest. With  that being said the patient also feels significantly better compared to what he was previous. All of this is good news but  nonetheless I am still concerned about the fact that again we are really not set up to long-term manage him as far as prednisone is concerned. Obviously there are things that you need to be watched I completely understand the risk of prednisone usage as well. 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 With all that being said I do believe that we can give him another round of the prednisone which she is requesting today because of the improvement that he seen since we did that first round. 10/30/2020 upon evaluation today patient's wound actually is showing signs of doing quite well. There does not appear to be any evidence of infection which is great news and overall very pleased with where things stand today. No fevers, chills, nausea, vomiting, or diarrhea. He tells me that the prednisone still has seem to have helped he wonders if we can extend that for just a little bit longer. He did not have the appointment with a dermatologist although he did have an infusion appointment last Friday. That was at [facility] With that being said he tells me he could not do both that as well as the appointment with the physician on the same day therefore that is can have to be rescheduled. I really want to see if there is anything they feel like that could be done differently to try to help this out as I am not really certain that the infusions are helping significantly here. 11/13/2020 upon evaluation today patient unfortunately appears to be doing somewhat poorly in regard to his wound I feel like this is actually worsening from the standpoint of the pyoderma spreading. I still feel like that he may need something different as far as trying to manage this going forward. Again we did the prednisone unfortunately his blood sugars are not doing so well  because of this. Nonetheless I believe that the patient likely needs to try topical steroid. We have done triamcinolone for a while I think going with something stronger such as clobetasol could be beneficial again this is not something I do lightly I discussed this with the patient that again this does not normally put underneath an occlusive dressing. Nonetheless I think a thin film as such could help with some of the stronger anti-inflammatory effects. We discussed this today. He would like to try to give this a trial for the next couple weeks. I definitely think that is something that we can do. Evaluate7/03/2021 and today patient's wound bed actually showed signs of doing really about the same. There was a little expansion of the size of the wound and that leading edge that we done looking out although the clobetasol does seem to have slowed this down a bit in my opinion. There is just 1 small area that still seems to be progressing based on what I see. Nonetheless I am concerned about the fact this does not seem to be improving if anything seems to be doing a little bit worse. I do not know that the infusions are really helping him much as next infusion is August 5 his appointment with dermatology is July 25. Either way I really think that we need to have a conversation potentially about this and I am actually going to see if I can talk with Dr. Lillia Carmel in order to see where things stand as well. 12/11/2020 upon evaluation today patient appears to be doing worse in regard to his leg ulcer. Unfortunately  I just do not think this is making the progress that I would like to see at this point. Honestly he does have an appointment with dermatology and this is in 2 days. I am wondering what they may have to offer to help with this. Right now what I am seeing is that he is continuing to show signs of worsening little by little. Obviously that is not great at all. Is the exact opposite of what we are  looking for. 12/18/2020 upon evaluation today patient appears to be doing a little better in regard to his wound. The dermatologist actually did do some steroid injections into the wound which does seem to have been beneficial in my opinion. That was on the 25th already this looks a little better to me than last time I saw him. With that being said we did do a culture and this did show that he has Staph aureus noted in abundance in the wound. With that being said I do think that getting him on an oral antibiotic would be appropriate as well. Also think we can compression wrap and this will make a difference as well. 12/28/2020 upon evaluation today patient's wound is actually showing signs of doing much better. I do believe the compression wrap is helping he has a lot of drainage but to be honest I think that the compression is helping to some degree in this regard as well as not draining through which is also good news. No fevers, chills, nausea, vomiting, or diarrhea. 01/04/2021 upon evaluation today patient appears to be doing well with regard to his wound. Overall things seem to be doing quite well. He did have a little bit of reaction to the CarboFlex Sorbact he will be using that any longer. With that being said he is controlled as far as the drainage is concerned overall and seems to be doing quite well. I do not see any signs of active infection at this time which is great news. No fevers, chills, nausea, vomiting, or diarrhea. 01/11/2021 upon evaluation today patient appears to be doing well with regard to his wounds. He has been tolerating the dressing changes without complication. Fortunately there does not appear to be any signs of active infection at this time which is great news. Overall I am extremely pleased with where we stand currently. No fevers, chills, nausea, vomiting, or diarrhea. Where using clobetasol in the wound bed he has a lot of new skin growth which is awesome as  well. 01/18/2021 upon evaluation today patient appears to be doing very well in regard to his leg ulcer. He has been tolerating the dressing changes without complication. Fortunately there does not appear to be any signs of active infection which is great news. In general I think that he is making excellent progress 01/25/2021 upon evaluation today patient appears to be doing well with regard to his wound on the leg. I am actually extremely pleased with where things stand today. There does not appear to be any signs of active infection which is great news and overall I think that we are definitely headed in the appropriate direction based on what I am seeing currently. There does not appear to be any signs of active infection also excellent news. 02/06/2021 upon evaluation today patient appears to be doing well with regard to his wound. Overall visually this is showing signs of significant improvement which is great news. I do not see any signs of active infection systemically which is great even locally  I do not think that we are seeing any major complications here. We did do fluorescence imaging with the [employer] today. The patient does have some odor and drainage noted and again this is something that I think would benefit him to probably come more frequently for nurse visits. 02/19/2021 upon evaluation today patient actually appears to be doing quite well in regard to his wound. He has been tolerating the dressing Nez, Shadrick ([id number]) changes without complication and overall I think that this is making excellent progress. I do not see any evidence of active infection at this point which is great news as well. No fevers, chills, nausea, vomiting, or diarrhea. 10/10; wound is made nice progress healthy granulation with a nice rim of epithelialization which seems to be expanding even from last week he has a deeper area in the inferior part of the more distal part of the wound with not quite as  healthy as surface. This area will need to be followed. Using clobetasol and Hydrofera Blue 03/05/2021 upon evaluation today patient appears to be doing very well in regard to his leg ulcer. He has been tolerating dressing changes without complication. Fortunately there does not appear to be any signs of active infection which is great news and overall I am extremely pleased with where we stand currently. 03/12/2021 upon evaluation today patient appears to be doing well with regard to his wound in fact this is extremely extremely good based on what we are seeing today there does not appear to be any signs of active infection and overall I think that he is doing awesome from the standpoint of healing in general. I am extremely pleased with how things seem to be progressing with regard to this pyoderma. Clobetasol has done wonders for him. I think the compression wrapping has also been of great benefit. 03/19/2021 upon evaluation today patient appears to be doing well with regard to his wound. He is tolerating the dressing changes without complication. In fact I feel like that he is actually making excellent progress at this point based on what I am seeing. No fevers, chills, nausea, vomiting, or diarrhea. 03/26/2021 upon evaluation today patient appears to be doing well with regard to his wound. This again is measuring smaller and looking better. Again the progress is slow but nonetheless continual with what we have been seeing. I do believe that the current plan is doing awesome for him. 04/09/2021 upon evaluation today patient appears to be doing well with regard to his leg ulcer. This is showing signs of excellent improvement the muscle is completely closed over and there does not appear to be any evidence of inflammation at this point his drainage is significantly improved. Overall I think that he would be a good candidate for looking into a skin substitute at this point as well. We will get a look  into some approvals in that regard. Potentially TheraSkin as well as Apligraf could both be considered just depending on insurance coverage. 04/16/2021 upon evaluation today patient appears to be doing well at this point. He has been approved for the Apligraf which we could definitely order although I would like to try to get the TheraSkin approved if at all possible. I did fax notes into them today and I Georgina Peer try to give a call as well. Overall the wound appears to be doing decently well today. Electronic Signature(s) Signed: 04/16/2021 5:09:58 PM By: Worthy Keeler PA-C Entered By: Worthy Keeler on 04/16/2021 17:09:58 Hallowell, [name] ([id number]) --------------------------------------------------------------------------------  Physical Exam Details Patient Name: ETAN, VASUDEVAN Date of Service: 04/16/2021 8:15 AM Medical Record Number: [id number] Patient Account Number: [id number] Date of Birth/Sex: December 19, 1980 (40 y.o. M) Treating RN: [name] Primary Care Provider: [name] Other Clinician: Referring Provider: [name] Treating Provider/Extender: [name] Cliche in Treatment: 23 Constitutional Well-nourished and well-hydrated in no acute distress. Respiratory normal breathing without difficulty. Psychiatric this patient is able to make decisions and demonstrates good insight into disease process. Alert and Oriented x 3. pleasant and cooperative. Notes Upon inspection patient's wound bed showed signs of good granulation epithelization at this point. Fortunately there does not appear to be any signs of active infection which is great and overall I am extremely happy with where we stand today. I do believe however he will be benefited by getting the TheraSkin in place obviously I think that the TheraSkin having the structure would be even better than the Apligraf that I think either could be of benefit for him I really want to try to get this closed as soon as  possible. Electronic Signature(s) Signed: 04/16/2021 5:10:31 PM By: Worthy Keeler PA-C Entered By: Worthy Keeler on 04/16/2021 17:10:31 AMAY, MIJANGOS ([id number]) -------------------------------------------------------------------------------- Physician Orders Details Patient Name: [name] Date of Service: 04/16/2021 8:15 AM Medical Record Number: [id number] Patient Account Number: [id number] Date of Birth/Sex: [birth year] (40 y.o. M) Treating RN: [name] Primary Care Provider: [name] Other Clinician: Referring Provider: [name] Treating Provider/Extender: [name] Cliche in Treatment: 227 Verbal / Phone Orders: No Diagnosis Coding ICD-10 Coding Code Description I87.2 Venous insufficiency (chronic) (peripheral) L97.222 Non-pressure chronic ulcer of left calf with fat layer exposed E11.622 Type 2 diabetes mellitus with other skin ulcer L88 Pyoderma gangrenosum F17.208 Nicotine dependence, unspecified, with other nicotine-induced disorders Follow-up Appointments o Return Appointment in 1 week. o Nurse Visit as needed - twice a week Bathing/ Shower/ Hygiene o Clean wound with Normal Saline or wound cleanser. o Other: - Apply dakins soaked gauze on wound after cleansing wound IN OFFICE ONLY Edema Control - Lymphedema / Segmental Compressive Device / Other o Optional: One layer of unna paste to top of compression wrap (to act as an anchor). o Elevate, Exercise Daily and Avoid Standing for Long Periods of Time. o Elevate legs to the level of the heart and pump ankles as often as possible o Elevate leg(s) parallel to the floor when sitting. Wound Treatment Wound #1 - Lower Leg Wound Laterality: Left, Lateral Cleanser: Wound Cleanser 3 x Per Week/30 Days Discharge Instructions: Wash your hands with soap and water. Remove old dressing, discard into plastic bag and place into trash. Cleanse the wound with Wound Cleanser prior to applying a  clean dressing using gauze sponges, not tissues or cotton balls. Do not scrub or use excessive force. Pat dry using gauze sponges, not tissue or cotton balls. Peri-Wound Care: Moisturizing Lotion 3 x Per Week/30 Days Discharge Instructions: Suggestions: Theraderm, Eucerin, Cetaphil, or patient preference. Topical: Clobetasol Propionate ointment 0.05%, 60 (g) tube 3 x Per Week/30 Days Discharge Instructions: Apply to 7 o'clock depth Primary Dressing: Hydrofera Blue Ready Transfer Foam, 4x5 (in/in) 3 x Per Week/30 Days Discharge Instructions: Apply Hydrofera Blue Ready to wound bed as directed Secondary Dressing: Xtrasorb Medium 4x5 (in/in) 3 x Per Week/30 Days Discharge Instructions: Apply to wound as directed. Do not cut. Compression Wrap: Medichoice 4 layer Compression System, 35-40 mmHG (Generic) 3 x Per Week/30 Days Discharge Instructions: Apply multi-layer wrap as directed. Electronic Signature(s) Signed: 04/16/2021 5:14:38 PM  By: Worthy Keeler PA-C Signed: 04/17/2021 9:26:19 AM By: [name], [name], RN, [name], [name], [name] Entered By: [name], [name], RN, [name], [name] on 04/16/2021 08:43:46 ALLAN, MINOTTI ([id number]) -------------------------------------------------------------------------------- Problem List Details Patient Name: [name] Date of Service: 04/16/2021 8:15 AM Medical Record Number: [id number] Patient Account Number: [id number] Date of Birth/Sex: 04/30/1981 (40 y.o. M) Treating RN: [name] Primary Care Provider: [name] Other Clinician: Referring Provider: [name] Treating Provider/Extender: [name] Cliche in Treatment: 227 Active Problems ICD-10 Encounter Code Description Active Date MDM Diagnosis I87.2 Venous insufficiency (chronic) (peripheral) 12/04/2016 No Yes L97.222 Non-pressure chronic ulcer of left calf with fat layer exposed 12/04/2016 No Yes E11.622 Type 2 diabetes mellitus with other skin ulcer 04/09/2021 No Yes L88 Pyoderma gangrenosum  03/26/2017 No Yes F17.208 Nicotine dependence, unspecified, with other nicotine-induced disorders 04/24/2020 No Yes Inactive Problems ICD-10 Code Description Active Date Inactive Date L97.213 Non-pressure chronic ulcer of right calf with necrosis of muscle 04/02/2017 04/02/2017 Resolved Problems ICD-10 Code Description Active Date Resolved Date L97.321 Non-pressure chronic ulcer of left ankle limited to breakdown of skin 10/08/2019 10/08/2019 L03.116 Cellulitis of left lower limb 05/24/2019 05/24/2019 Electronic Signature(s) Signed: 04/16/2021 8:30:52 AM By: Worthy Keeler PA-C Entered By: Worthy Keeler on 04/16/2021 08:30:52 Legrand, Clance ([id number]) -------------------------------------------------------------------------------- Progress Note Details Patient Name: [name] Date of Service: 04/16/2021 8:15 AM Medical Record Number: [id number] Patient Account Number: [id number] Date of Birth/Sex: May 31, 1980 (40 y.o. M) Treating RN: [name] Primary Care Provider: [name] Other Clinician: Referring Provider: [name] Treating Provider/Extender: [name] Cliche in Treatment: 227 Subjective Chief Complaint Information obtained from Patient He is here in follow up evaluation for LLE pyoderma ulcer History of Present Illness (HPI) 12/04/16; 40 year old man who comes into the clinic today for review of a wound on the posterior left calf. He tells me that is been there for about a year. He is not a diabetic he does smoke half a pack per day. He was seen in the ER on 11/20/16 felt to have cellulitis around the wound and was given clindamycin. An x-ray did not show osteomyelitis. The patient initially tells me that he has a milk allergy that sets off a pruritic itching rash on his lower legs which she scratches incessantly and he thinks that's what may have set up the wound. He has been using various topical antibiotics and ointments without any effect. He works in a [employer]ing  [employer] and is on his feet all day. He does not have a prior history of wounds however he does have the rash on both lower legs the right arm and the ventral aspect of his left arm. These are excoriations and clearly have had scratching however there are of macular looking areas on both legs including a substantial larger area on the right leg. This does not have an underlying open area. There is no blistering. The patient tells me that 2 years ago in [place] in response to the rash on his legs he saw a dermatologist who told him he had a condition which may be pyoderma gangrenosum although I may be putting words into his mouth. He seemed to recognize this. On further questioning he admits to a 5 year history of quiesced. ulcerative colitis. He is not in any treatment for this. He's had no recent travel 12/11/16; the patient arrives today with his wound and roughly the same condition we've been using silver alginate this is a deep punched out wound with some surrounding erythema but no  tenderness. Biopsy I did did not show confirmed pyoderma gangrenosum suggested nonspecific inflammation and vasculitis but does not provide an actual description of what was seen by the pathologist. I'm really not able to understand this We have also received information from the patient's dermatologist in [place] notes from April 2016. This was a doctor Agarwal-antal. 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 He was prescribed topical steroid high potency under occlusion which helped but at this point the patient did not have a deep punched out wound. 12/18/16; the patient's wound is larger in terms of surface area however this surface looks better and there is less depth. The surrounding erythema also is better. The patient states that the wrap we put on came off 2 days ago when he has been using his compression stockings. He we are in the process of getting a dermatology consult. 12/26/16 on evaluation today  patient's left lower extremity wound shows evidence of infection with surrounding erythema noted. He has been tolerating the dressing changes but states that he has noted more discomfort. There is a larger area of erythema surrounding the wound. No fevers, chills, nausea, or vomiting noted at this time. With that being said the wound still does have slough covering the surface. He is not allergic to any medication that he is aware of at this point. In regard to his right lower extremity he had several regions that are erythematous and pruritic he wonders if there's anything we can do to help that. 01/02/17 I reviewed patient's wound culture which was obtained his visit last week. He was placed on doxycycline at that point. Unfortunately that does not appear to be an antibiotic that would likely help with the situation however the pseudomonas noted on culture is sensitive to Cipro. Also unfortunately patient's wound seems to have a large compared to last week's evaluation. Not severely so but there are definitely increased measurements in general. He is continuing to have discomfort as well he writes this to be a seven out of 10. In fact he would prefer me not to perform any debridement today due to the fact that he is having discomfort and considering he has an active infection on the little reluctant to do so anyway. No fevers, chills, nausea, or vomiting noted at this time. 01/08/17; patient seems dermatology on September 5. I suspect dermatology will want the slides from the biopsy I did sent to their pathologist. I'm not sure if there is a way we can expedite that. In any case the culture I did before I left on vacation 3 weeks ago showed Pseudomonas he was given 10 days of Cipro and per her description of her intake nurses is actually somewhat better this week although the wound is quite a bit bigger than I remember the last time I saw this. He still has 3 more days of Cipro 01/21/17; dermatology  appointment tomorrow. He has completed the ciprofloxacin for Pseudomonas. Surface of the wound looks better however he is had some deterioration in the lesions on his right leg. Meantime the left lateral leg wound we will continue with sample 01/29/17; patient had his dermatology appointment but I can't yet see that note. He is completed his antibiotics. The wound is more superficial but considerably larger in circumferential area than when he came in. This is in his left lateral calf. He also has swollen erythematous areas with superficial wounds on the right leg and small papular areas on both arms. There apparently areas in her  his upper thighs and buttocks I did not look at those. Dermatology biopsied the right leg. Hopefully will have their input next week. 02/05/17; patient went back to see his dermatologist who told him that he had a "scratching problem" as well as staph. He is now on a 30 day course of doxycycline and I believe she gave him triamcinolone cream to the right leg areas to help with the itching [not exactly sure but probably triamcinolone]. She apparently looked at the left lateral leg wound although this was not rebiopsied and I think felt to be ultimately part of the same pathogenesis. He is using sample border foam and changing nevus himself. He now has a new open area on the right posterior leg which was his biopsy site I don't have any of the dermatology notes 02/12/17; we put the patient in compression last week with SANTYL to the wound on the left leg and the biopsy. Edema is much better and the depth of the wound is now at level of skin. Area is still the same Biopsy site on the right lateral leg we've also been using santyl with a border foam dressing and he is changing this himself. 02/19/17; Using silver alginate started last week to both the substantial left leg wound and the biopsy site on the right wound. He is tolerating compression well. Has a an appointment with his  primary M.D. tomorrow wondering about diuretics although I'm wondering if the edema problem is actually lymphedema REDFORD, BEHRLE ([id number]) 02/26/17; the patient has been to see his primary doctor Dr. Jerrel Ivory at [facility] our primary care. She started him on Lasix 20 mg and this seems to have helped with the edema. However we are not making substantial change with the left lateral calf wound and inflammation. The biopsy site on the right leg also looks stable but not really all that different. 03/12/17; the patient has been to see vein and vascular Dr. Lucky Cowboy. He has had venous reflux studies I have not reviewed these. I did get a call from his dermatology office. 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 They therefore recommended clobetasol and tetracycline i.e. aggressive treatment for possible pyoderma gangrenosum. 03/26/17; apparently the patient just had reflux studies not an appointment with Dr. dew. She arrives in clinic today having applied clobetasol for 2-3 weeks. He notes over the last 2-3 days excessive drainage having to change the dressing 3-4 times a day and also expanding erythema. 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 Lillia Carmel at [facility] dermatology locally who kindly saw him at my request. 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 My concern was the degree of erythema medially extending from his  left leg wound which was either inflammation from pyoderma or cellulitis. I put him on Augmentin however culture of the wound showed Pseudomonas which is quinolone sensitive. I really don't believe he has cellulitis however in view of everything  I will continue and give him a course of Cipro. He is also on doxycycline as an immune modulator for the pyoderma. In addition to his original wound on the left lateral leg with surrounding erythema he has a wound on the right posterior calf which was an original biopsy site done by dermatology. 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 Saw Dr. Lillia Carmel yesterday. He has been using topical antibiotics to both wound areas his original wound on the left and the biopsies/pathergy area on the right. There is definitely some improvement in the inflammation around the wound on the right although the patient states he has increasing sensitivity of the wounds. He is on prednisone 60 and doxycycline 1 as prescribed by Dr. Lillia Carmel. He is covering the topical antibiotic with gauze and putting this in his own compression stocks and changing this daily. He states that Dr. Lottie Rater did a culture of the left leg wound yesterday 05/07/17; pyoderma gangrenosum. The patient saw Dr. Lillia Carmel yesterday and has a follow-up with her in one month. He is still using topical antibiotics to both wounds although he can't recall exactly what type. He is still on prednisone 60 mg. Dr. Lillia Carmel stated that the doxycycline could stop if we were in agreement. 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 The right medial leg was induced by biopsy/pathergy. The area on the right is essentially healed. Still on high-dose prednisone using topical antibiotics to the wound 07/09/17; pyoderma gangrenosum with wounds on the left lateral  leg. The right medial leg has closed and remains closed. He is still on prednisone 60. He tells me he missed his last dermatology appointment with Dr. Lillia Carmel but will make another appointment. He reports that her blood sugar at a recent screen in [place] was high 200's. He was 180 today. 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 I think he is going to require still much longer prednisone perhaps another 3 months before attempting to taper. In the meantime his wound is a lot better. Smaller. He is cleaning this off daily and applying topical antibiotics. When he was last in the clinic I thought about changing to [facility] and actually put in a couple of calls to dermatology although probably not during their business hours. 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 He continues on oral prednisone. He has been using triple antibiotic ointment. There is surface debris and we will transition to [facility] and have him return in 2 weeks. He has lost 30 pounds since his last appointment with lifestyle modification. He may benefit from topical steroid cream for treatment this can be considered at a later date. 08/22/17 on evaluation today patient appears to actually be doing rather well in regard to his left lateral lower extremity ulcer. He has actually been managed by Dr. Dellia Nims most recently. Patient is currently on oral steroids at this time. This seems to have been of benefit for him. Nonetheless his last visit was actually with Leah on 08/06/17. Currently he is not utilizing any topical steroid creams although this could be of benefit as well. No fevers, chills, nausea, or vomiting noted at this time. 09/05/17 on evaluation today patient appears to be doing better in regard to  his left lateral lower extremity ulcer. He has been tolerating the dressing changes without  complication. He is using Santyl with good effect. Overall I'm very pleased with how things are standing at this point. Patient likewise is happy that this is doing better. 09/19/17 on evaluation today patient actually appears to be doing rather well in regard to his left lateral lower extremity ulcer. 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 He's not having any significant pain. 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 He has been tolerating the Santyl without complication and in general I feel like he's making good progress. 10/17/17 on evaluation today patient appears to be doing very well in regard to his left lateral lower surety ulcer. He has been tolerating the dressing changes without complication. There does not appear to be any evidence of infection he's alternating the Santyl and the triple antibiotic ointment every other day this seems to be doing well for him. 11/03/17 on evaluation today patient appears to be doing very well in regard to his left lateral lower extremity ulcer. He is been tolerating the dressing changes without complication which is good news. Fortunately there does not appear to be any evidence of infection which is also great news. Overall is doing excellent they are starting to taper down on the prednisone is down to 40 mg at this point it also started topical clobetasol for him. 11/17/17 on evaluation today patient appears to be doing well in regard to his left lateral lower surety ulcer. He's been tolerating the dressing changes without complication. He does note that he is having no pain, no excessive drainage or discharge, and overall he feels like things are going about how he would expect and hope they would. Overall he seems to have no evidence of infection at this time in my  opinion which is good news. 12/04/17-He is seen in follow-up evaluation for right lateral lower extremity ulcer. He has been applying topical steroid cream. Today's measurement show slight increase in size. Over the next 2 weeks we will transition to every other day Santyl and steroid cream. He has been encouraged to monitor for changes and notify clinic with any concerns 12/15/17 on evaluation today patient's left lateral motion the ulcer and fortunately is doing worse again at this point. This just since last week to this week has close to doubled in size according to the patient. I did not seeing last week's I do not have a visual to compare this to in our system was also down so we do not have all the charts and at this point. Nonetheless it does have me somewhat concerned in regard to the fact that again he was worried enough about it he has contact the dermatology that placed them back on the full strength, 50 mg a day of the prednisone that he was taken previous. He continues to alternate using clobetasol along with Santyl at this point. He is obviously somewhat frustrated. KEILAN, NICHOL ([id number]) 12/22/17 on evaluation today patient appears to be doing a little worse compared to last evaluation. Unfortunately the wound is a little deeper and slightly larger than the last week's evaluation. With that being said he has made some progress in regard to the irritation surrounding at this time unfortunately despite that progress that's been made he still has a  significant issue going on here. 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 For that reason I discussed with him today the possibility of perform a wound culture to ensure there's nothing overtly infected. 01/06/18 on evaluation today patient's wound is larger and deeper than previously  evaluated. With that being said it did appear that his wound was infected after my last evaluation with him. Subsequently I did end up prescribing a prescription for Bactrim DS which she has been taking and having no complication with. Fortunately there does not appear to be any evidence of infection at this point in time as far as anything spreading, no want to touch, and overall I feel like things are showing signs of improvement. 01/13/18 on evaluation today patient appears to be even a little larger and deeper than last time. There still muscle exposed in the base of the wound. Nonetheless he does appear to be less erythematous I do believe inflammation is calming down also believe the infection looks like it's probably resolved at this time based on what I'm seeing. No fevers, chills, nausea, or vomiting noted at this time. 01/30/18 on evaluation today patient actually appears to visually look better for the most part. Unfortunately those visually this looks better he does seem to potentially have what may be an abscess in the muscle that has been noted in the central portion of the wound. This is the first time that I have noted what appears to be fluctuance in the central portion of the muscle. With that being said I'm somewhat more concerned about the fact that this might indicate an abscess formation at this location. I do believe that an ultrasound would be appropriate. This is likely something we need to try to do as soon as possible. He has been switch to mupirocin ointment and he is no longer using the steroid ointment as prescribed by dermatology he sees them again next week he's been decreased from 60 to 40 mg of prednisone. 03/09/18 on evaluation today patient actually appears to be doing a little better compared to last time I saw him. There's not as much erythema surrounding the wound itself. He I did review his most recent infectious disease note which was dated 02/24/18. He saw Dr.  Michel Bickers in [place]. 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 Patient is now off of antibiotics and they are continually observing this. There seems to be no change in the past two weeks in my pinion based on what the patient says and what I see today compared to what Dr. Megan Salon likely saw two weeks ago. No fevers, chills, nausea, or vomiting noted at this time. 03/23/18 on evaluation today patient's wound actually appears to be showing signs of improvement which is good news. He is currently still on the Dapsone. He is also working on tapering the prednisone to get off of this and Dr. Lottie Rater is working with him in this regard. Nonetheless overall I feel like the wound is doing well it does appear based on the infectious disease note that I reviewed from Dr. 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 I did also review the results  of his ultrasound of left lower extremity which revealed there was a dentist tissue in the base of the wound without an abscess noted. 04/06/18 on evaluation today the patient's left lateral lower extremity ulcer actually appears to be doing fairly well which is excellent news. There does not appear to be any evidence of infection at this time which is also great news. Overall he still does have a significantly large ulceration although little by little he seems to be making progress. He is down to 10 mg a day of the prednisone. 04/20/18 on evaluation today patient actually appears to be doing excellent at this time in regard to his left lower extremity ulcer. He's making signs of good progress unfortunately this is taking much longer than we would really like to see but nonetheless he is making progress. Fortunately there does not appear to be any evidence of  infection at this time. No fevers, chills, nausea, or vomiting noted at this time. The patient has not been using the Santyl due to the cost he hadn't got in this field yet. He's mainly been using the antibiotic ointment topically. Subsequently he also tells me that he really has not been scrubbing in the shower I think this would be helpful again as I told him it doesn't have to be anything too aggressive to even make it believe just enough to keep it free of some of the loose slough and biofilm on the wound surface. 05/11/18 on evaluation today patient's wound appears to be making slow but sure progress in regard to the left lateral lower extremity ulcer. He is been tolerating the dressing changes without complication. Fortunately there does not appear to be any evidence of infection at this time. He is still just using triple antibiotic ointment along with clobetasol occasionally over the area. He never got the Santyl and really does not seem to intend to in my pinion. 06/01/18 on evaluation today patient appears to be doing a little better in regard to his left lateral lower extremity ulcer. He states that overall he does not feel like he is doing as well with the Dapsone as he did with the prednisone. Nonetheless he sees his dermatologist later today and is gonna talk to them about the possibility of going back on the prednisone. Overall again I believe that the wound would be better if you would utilize Santyl but he really does not seem to be interested in going back to the [place] at this point. He has been using triple antibiotic ointment. 06/15/18 on evaluation today patient's wound actually appears to be doing about the same at this point. Fortunately there is no signs of infection at this time. He has made slight improvements although he continues to not really want to clean the wound bed at this point. He states that he just doesn't mess with it he doesn't want to cause any problems with  everything else he has going on. He has been on medication, antibiotics as prescribed by his dermatologist, for a staff infection of his lower extremities which is really drying out now and looking much better he tells me. Fortunately there is no sign of overall infection. 06/29/18 on evaluation today patient appears to be doing well in regard to his left lateral lower surety ulcer all things considering. Fortunately his staff infection seems to be greatly improved compared to previous. He has no signs of infection and this is drying up quite nicely. He is still the doxycycline for this is no  longer on cental, Dapsone, or any of the other medications. His dermatologist has recommended possibility of an infusion but right now he does not want to proceed with that. 07/13/18 on evaluation today patient appears to be doing about the same in regard to his left lateral lower surety ulcer. Fortunately there's no signs of infection at this time which is great news. Unfortunately he still builds up a significant amount of Slough/biofilm of the surface of the wound he still is not really cleaning this as he should be appropriately. Again I'm able to easily with saline and gauze remove the majority of this on the surface which if you would do this at home would likely be a dramatic improvement for him as far as getting the area to improve. Nonetheless overall I still feel like he is making progress is just very slow. I think Santyl will be of benefit for him as well. Still he has not gotten this as of this point. 07/27/18 on evaluation today patient actually appears to be doing little worse in regards of the erythema around the periwound region of the wound he also tells me that he's been having more drainage currently compared to what he was experiencing last time I saw him. He states not quite as bad as what he had because this was infected previously but nonetheless is still appears to be doing poorly.  Fortunately there is no evidence of systemic infection at this point. The patient tells me that he is not going to be able to afford the Santyl. He is still waiting to hear about the infusion therapy with his dermatologist. Apparently she wants an updated colonoscopy first. GAVAN, NORDBY ([id number]) 08/10/18 on evaluation today patient appears to be doing better in regard to his left lateral lower extremity ulcer. Fortunately he is showing signs of improvement in this regard he's actually been approved for Remicade infusion's as well although this has not been scheduled as of yet. Fortunately there's no signs of active infection at this time in regard to the wound although he is having some issues with infection of the right lower extremity is been seen as dermatologist for this. Fortunately they are definitely still working with him trying to keep things under control. 09/07/18 on evaluation today patient is actually doing rather well in regard to his left lateral lower extremity ulcer. He notes these actually having some hair grow back on his extremity which is something he has not seen in years. He also tells me that the pain is really not giving them any trouble at this time which is also good news overall she is very pleased with the progress he's using a combination of the mupirocin along with the probate is all mixed. 09/21/18 on evaluation today patient actually appears to be doing fairly well all things considered in regard to his looks from the ulcer. He's been tolerating the dressing changes without complication. Fortunately there's no signs of active infection at this time which is good news he is still on all antibiotics or prevention of the staff infection. He has been on prednisone for time although he states it is gonna contact his dermatologist and see if she put them on a short course due to some irritation that he has going on currently. Fortunately there's no evidence of any  overall worsening this is going very slow I think cental would be something that would be helpful for him although he states that $50 for tube is quite expensive. He therefore is  not willing to get that at this point. 10/06/18 on evaluation today patient actually appears to be doing decently well in regard to his left lateral leg ulcer. He's been tolerating the dressing changes without complication. Fortunately there's no signs of active infection at this time. Overall I'm actually rather pleased with the progress he's making although it's slow he doesn't show any signs of infection and he does seem to be making some improvement. I do believe that he may need a switch up and dressings to try to help this to heal more appropriately and quickly. 10/19/18 on evaluation today patient actually appears to be doing better in regard to his left lateral lower extremity ulcer. This is shown signs of having much less Slough buildup at this point due to the fact he has been using the [employer]. Obviously this is very good news. The overall size of the wound is not dramatically smaller but again the appearance is. 11/02/18 on evaluation today patient actually appears to be doing quite well in regard to his lower [place] ulcer. A lot of the skin around the ulcer is actually somewhat irritating at this point this seems to be more due to the dressing causing irritation from the adhesive that anything else. Fortunately there is no signs of active infection at this time. 11/24/18 on evaluation today patient appears to be doing a little worse in regard to his overall appearance of his lower extremity ulcer. There's more erythema and warmth around the wound unfortunately. He is currently on doxycycline which he has been on for some time. With that being said I'm not sure that seems to be helping with what appears to possibly be an acute cellulitis with regard to his left lower extremity ulcer. No fevers, chills, nausea, or  vomiting noted at this time. 12/08/18 on evaluation today patient's wounds actually appears to be doing significantly better compared to his last evaluation. He has been using Santyl along with alternating tripling about appointment as well as the steroid cream seems to be doing quite well and the wound is showing signs of improvement which is excellent news. Fortunately there's no evidence of infection and in fact his culture came back negative with only normal skin flora noted. 12/21/2018 upon evaluation today patient actually appears to be doing excellent with regard to his ulcer. This is actually the best that I have seen it since have been helping to take care of him. It is both smaller as well as less slough noted on the surface of the wound and seems to be showing signs of good improvement with new skin growing from the edges. He has been using just the triamcinolone he does wonder if he can get a refill of that ointment today. 01/04/2019 upon evaluation today patient actually appears to be doing well with regard to his left lateral lower extremity ulcer. With that being said it does not appear to be that he is doing quite as well as last time as far as progression is concerned. There does not appear to be any signs of infection or significant irritation which is good news. With that being said I do believe that he may benefit from switching to a collagen based dressing based on how clean The wound appears. 01/18/2019 on evaluation today patient actually appears to be doing well with regard to his wound on the left lower extremity. He is not made a lot of progress compared to where we were previous but nonetheless does seem to be doing okay  at this time which is good news. There is no signs of active infection which is also good news. My only concern currently is I do wish we can get him into utilizing the collagen dressing his insurance would not pay for the supplies that we ordered although it  appears that he may be able to order this through his supply company that he typically utilizes. This is [employer]. Nonetheless he did try to order it during the office visit today and it appears this did go through. We will see if he can get that it is a different brand but nonetheless he has collagen and I do think will be beneficial. 02/01/2019 on evaluation today patient actually appears to be doing a little worse today in regard to the overall size of his wounds. Fortunately there is no signs of active infection at this time. That is visually. Nonetheless when this is happened before it was due to infection. For that reason were somewhat concerned about that this time as well. 02/08/2019 on evaluation today patient unfortunately appears to be doing slightly worse with regard to his wound upon evaluation today. Is measuring a little deeper and a little larger unfortunately. I am not really sure exactly what is causing this to enlarge he actually did see his dermatologist she is going to see about initiating Humira for him. Subsequently she also did do steroid injections into the wound itself in the periphery. Nonetheless still nonetheless he seems to be getting a little bit larger he is gone back to just using the steroid cream topically which I think is appropriate. I would say hold off on the collagen for the time being is definitely a good thing to do. Based on the culture results which we finally did get the final result back regarding it shows staph as the bacteria noted again that can be a normal skin bacteria based on the fact however he is having increased drainage and worsening of the wound measurement wise I would go ahead and place him on an antibiotic today I do believe for this. 02/15/2019 on evaluation today patient actually appears to be doing somewhat better in regard to his ulcer. There is no signs of worsening at this time I did review his culture results which showed evidence of  Staphylococcus aureus but not MRSA. Again this could just be more related to the normal skin bacteria although he states the drainage has slowed down quite a bit he may have had a mild infection not just colonization. And was much smaller and then since around10/04/2019 on evaluation today patient appears to be doing unfortunately worse as far as the size of the wound. I really feel like that this is steadily getting larger again it had been doing excellent right at the beginning of September we have seen a steady increase in the area of the wound it is almost 2-1/2 times the size it was on September 1. Obviously this is a bad trend this is not wanting to see. For that reason we went back to using just the topical triamcinolone cream which does seem to help with inflammation. I checked him for bacteria by way of culture and nothing showed positive there. I am considering giving him a short course of a tapering steroid Miqueas Whilden, [place] ([id number]) today to see if that is can be beneficial for him. The patient is in agreement with giving that a try. 03/08/2019 on evaluation today patient appears to be doing very well in comparison to  last evaluation with regard to his lower extremity ulcer. This is showing signs of less inflammation and actually measuring slightly smaller compared to last time every other week over the past month and a half he has been measuring larger larger larger. Nonetheless I do believe that the issue has been inflammation the prednisone does seem to have been beneficial for him which is good news. No fevers, chills, nausea, vomiting, or diarrhea. 03/22/2019 on evaluation today patient appears to be doing about the same with regard to his leg ulcer. He has been tolerating the dressing changes without complication. With that being said the wound seems to be mostly arrested at its current size but really is not making any progress except for when we prescribed the prednisone. He  did show some signs of dropping as far as the overall size of the wound during that interval week. Nonetheless this is something he is not on long-term at this point and unfortunately I think he is getting need either this or else the Humira which his dermatologist has discussed try to get approval for. With that being said he will be seeing his dermatologist on the 11th of this month that is November. 04/19/2019 on evaluation today patient appears to be doing really about the same the wound is measuring slightly larger compared to last time I saw him. He has not been into the office since November 2 due to the fact that he unfortunately had Covid as that his entire family. He tells me that it was rough but they did pull-through and he seems to be doing much better. Fortunately there is no signs of active infection at this time. No fevers, chills, nausea, vomiting, or diarrhea. 05/10/2019 on evaluation today patient unfortunately appears to be doing significantly worse as compared to last time I saw him. He does tell me that he has had his first dose of Humira and actually is scheduled to get the next one in the upcoming week. With that being said he tells me also that in the past several days he has been having a lot of issues with green drainage she showed me a picture this is more blue-green in color. He is also been having issues with increased sloughy buildup and the wound does appear to be larger today. Obviously this is not the direction that we want everything to take based on the starting of his Humira. Nonetheless I think this is definitely a result of likely infection and to be honest I think this is probably Pseudomonas causing the infection based on what I am seeing. 05/24/2019 on evaluation today patient unfortunately appears to be doing significantly worse compared to his prior evaluation with me 2 weeks ago. I did review his culture results which showed that he does have Staph aureus as  well as Pseudomonas noted on the culture. Nonetheless the Levaquin that I prescribed for him does not appear to have been appropriate and in fact he tells me he is no longer experiencing the green drainage and discharge that he had at the last visit. Fortunately there is no signs of active infection at this time which is good news although the wound has significantly worsened it in fact is much deeper than it was previous. We have been utilizing up to this point triamcinolone ointment as the prescription topical of choice but at this time I really feel like that the wound is getting need to be packed in order to appropriately manage this due to the deeper nature  of the wound. Therefore something along the lines of an alginate dressing may be more appropriate. 05/31/2019 upon inspection today patient's wound actually showed signs of doing poorly at this point. Unfortunately he just does not seem to be making any good progress despite what we have tried. He actually did go ahead and pick up the Cipro and start taking that as he was noticing more green drainage he had previously completed the Levaquin that I prescribed for him as well. Nonetheless he missed his appointment for the seventh last week on Wednesday with the wound care center and [facility] where his dermatologist referred him. Obviously I do think a second opinion would be helpful at this point especially in light of the fact that the patient seems to be doing so poorly despite the fact that we have tried everything that I really know how at this point. The only thing that ever seems to have helped him in the past is when he was on high doses of continual steroids that did seem to make a difference for him. 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 06/07/2019 upon evaluation today  patient unfortunately appears to be doing worse yet again with regard to his wound. In fact I am starting to question whether or not he may have a fluid pocket in the muscle at this point based on the bulging and the soft appearance to the central portion of the muscle area. There is not anything draining from the muscle itself at this time which is good news but nonetheless the wound is expanding. I am not really seeing any results of the Humira as far as overall wound progression based on what I am seeing at this point. The patient has been referred for second opinion with regard to his wound to the [facility] wound care center by his dermatologist which I definitely am not in opposition to. Unfortunately we tried multiple dressings in the past including collagen, alginate, and at one point even Hydrofera Blue. With that being said he is never really used it for any significant amount of time due to the fact that he often complains of pain associated with these dressings and then will go back to either using the Santyl which she has done intermittently or more frequently the triamcinolone. He is also using his own compression stockings. We have wrapped him in the past but again that was something else that he really was not a big fan of. 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 He seemed to do much better when he was on the steroids although honestly I understand that the steroids are not necessarily the best medication to be on long-term obviously 06/14/2019 on evaluation today patient appears to be doing actually a little bit better with regard to the overall appearance with his  leg. Unfortunately he does continue to have issues with what appears to be some fluid underneath the muscle although he did see the  wound specialty center at [facility] last week their main goals were to see about infusion therapy in place of the Humira as they feel like that is not quite strong enough. They also recommended that we continue with the treatment otherwise as we are they felt like that was appropriate and they are okay with him continuing to follow-up here with [place] in that regard. With that being said they are also sending him to the vein specialist there to see about vein stripping and if that would be of benefit for him. Subsequently they also did not really address whether or not an ultrasound of the muscle area to see if there is anything that needs to be addressed here would be appropriate or not. For that reason I discussed this with him last week I think we may proceed down that road at this point. 06/21/2019 upon evaluation today patient's wound actually appears to be doing slightly better compared to previous evaluations. I do believe that he has made a difference with regard to the progression here with the use of oral steroids. Again in the past has been the only thing that is really calm things down. He does tell me that from [facility] is gotten a good news from there that there are no further vein stripping that is necessary at this point. I do not have that available for review today although the patient did relay this to me. He also did obtain and have the ultrasound of the wound completed which I did sign off on today. It does appear that there is no fluid collection under the muscle this is likely then just edematous tissue in general. That is also good news. Overall I still believe the inflammation is the main issue here. He did inquire about the possibility of a wound VAC again with the muscle protruding like it is I am not really sure whether the wound VAC is necessarily ideal or not.  That is something we will have to consider although I do believe he may need compression wrapping to try to help with edema control which could potentially be of benefit. 06/28/2019 on evaluation today patient appears to be doing slightly better measurement wise although this is not terribly smaller he least seems to be trending towards that direction. With that being said he still seems to have purulent drainage noted in the wound bed at this time. He has been on Levaquin followed by Cipro over the past month. Unfortunately he still seems to have some issues with active infection at this time. I did perform a culture last week in order to evaluate and see if indeed there was still anything going on. Subsequently the culture did come back [place], [place] ([id number]) showing Pseudomonas which is consistent with the drainage has been having which is blue-green in color. He also has had an odor that again was somewhat consistent with Pseudomonas as well. Long story short it appears that the culture showed an intermediate finding with regard to how well the Cipro will work for the Pseudomonas infection. Subsequently being that he does not seem to be clearing up and at best what we are doing is just keeping this at [place] I think he may need to see infectious disease to discuss IV antibiotic options. 07/05/2019 upon evaluation today patient appears to be doing okay in regard to his leg ulcer. He has been tolerating the dressing changes at this point without complication. Fortunately there is no signs of active infection at this time which  is good news. No fevers, chills, nausea, vomiting, or diarrhea. With that being said he does have an appointment with infectious disease tomorrow and his primary care on Wednesday. Again the reason for the infectious disease referral was due to the fact that he did not seem to be fully resolving with the use of oral antibiotics and therefore we were thinking that IV antibiotic  therapy may be necessary secondary to the fact that there was an intermediate finding for how effective the Cipro may be. Nonetheless again he has been having a lot of purulent and even green drainage. Fortunately right now that seems to have calmed down over the past week with the reinitiation of the oral antibiotic. Nonetheless we will see what Dr. Megan Salon has to say. 07/12/2019 upon evaluation today patient appears to be doing about the same at this point in regard to his left lower extremity ulcer. Fortunately there is no signs of active infection at this time which is good news I do believe the Levaquin has been beneficial I did review Dr. Hale Bogus note and to be honest I agree that the patient's leg does appear to be doing better currently. What we found in the past as he does not seem to really completely resolve he will stop the antibiotic and then subsequently things will revert back to having issues with blue-green drainage, increased pain, and overall worsening in general. Obviously that is the reason I sent him back to infectious disease. 07/19/2019 upon evaluation today patient appears to be doing roughly the same in size there is really no dramatic improvement. He has started back on the Levaquin at this point and though he seems to be doing okay he did still have a lot of blue/green drainage noted on evaluation today unfortunately. I think that this is still indicative more likely of a Pseudomonas infection as previously noted and again he does see Dr. Megan Salon in just a couple of days. I do not know that were really able to effectively clear this with just oral antibiotics alone based on what I am seeing currently. Nonetheless we are still continue to try to manage as best we can with regard to the patient and his wound. I do think the wrap was helpful in decreasing the edema which is excellent news. No fevers, chills, nausea, vomiting, or diarrhea. 07/26/2019 upon evaluation today  patient appears to be doing slightly better with regard to the overall appearance of the muscle there is no dark discoloration centrally. Fortunately there is no signs of active infection at this time. No fevers, chills, nausea, vomiting, or diarrhea. Patient's wound bed currently the patient did have an appointment with Dr. Megan Salon at infectious disease last week. With that being said Dr. Megan Salon the patient states was still somewhat hesitant about put him on any IV antibiotics he wanted [place] to repeat cultures today and then see where things go going forward. He does look like Dr. Megan Salon because of some improvement the patient did have with the Levaquin wanted [place] to see about repeating cultures. If it indeed grows the Pseudomonas again then he recommended a possibility of considering a PICC line placement and IV antibiotic therapy. He plans to see the patient back in 1 to 2 weeks. 08/02/2019 upon evaluation today patient appears to be doing poorly with regard to his left lower extremity. We did get the results of his culture back it shows that he is still showing evidence of Pseudomonas which is consistent with the purulent/blue-green drainage that  he has currently. Subsequently the culture also shows that he now is showing resistance to the oral fluoroquinolones which is unfortunate as that was really the only thing to treat the infection prior. I do believe that he is looking like this is going require IV antibiotic therapy to get this under control. Fortunately there is no signs of systemic infection at this time which is good news. The patient does see Dr. Megan Salon tomorrow. 08/09/2019 upon evaluation today patient appears to be doing better with regard to his left lower extremity ulcer in regard to the overall appearance. He is currently on IV antibiotic therapy. As ordered by Dr. Megan Salon. Currently the patient is on ceftazidime which she is going to take for the next 2 weeks and then follow-up  for 4 to 5-week appointment with Dr. Megan Salon. The patient started this this past Friday symptoms have not for a total of 3 days currently in full. 08/16/2019 upon evaluation today patient's wound actually does show muscle in the base of the wound but in general does appear to be much better as far as the overall evidence of infection is concerned. In fact I feel like this is for the most part cleared up he still on the IV antibiotics he has not completed the full course yet but I think he is doing much better which is excellent news. 08/23/2019 upon evaluation today patient appears to be doing about the same with regard to his wound at this point. He tells me that he still has pain unfortunately. Fortunately there is no evidence of systemic infection at this time which is great news. There is significant muscle protrusion. 09/13/19 upon evaluation today patient appears to be doing about the same in regard to his leg unfortunately. He still has a lot of drainage coming from the ulceration there is still muscle exposed. With that being said the patient's last wound culture still showed an intermediate finding with regard to the Pseudomonas he still having the bluish/green drainage as well. Overall I do not know that the wound has completely cleared of infection at this point. Fortunately there is no signs of active infection systemically at this point which is good news. 09/20/2019 upon evaluation today patient's wound actually appears to be doing about the same based on what I am seeing currently. I do not see any signs of systemic infection he still does have evidence of some local infection and drainage. He did see Dr. Megan Salon last week and Dr. Megan Salon states that he probably does need a different IV antibiotic although he does not want to put him on this until the patient begins the Remicade infusion which is actually scheduled for about 10 days out from today on 13 May. Following that time Dr. Megan Salon  is good to see him back and then will evaluate the feasibility of starting him on the IV antibiotic therapy once again at that point. I do not disagree with this plan I do believe as Dr. Megan Salon stated in his note that I reviewed today that the patient's issue is multifactorial with the pyoderma being 1 aspect of this that were hoping the Remicade will be helpful for her. In the meantime I think the gentamicin is, helping to keep things under decent okay control in regard to the ulcer. 09/27/2019 upon evaluation today patient appears to be doing about the same with regard to his wound still there is a lot of muscle exposure though he does have some hyper granulation tissue noted around the  edge and actually some granulation tissue starting to form over the muscle which is actually good news. Fortunately there is no evidence of active infection which is also good news. His pain is less at this point. 5/21; this is a patient I have not seen in a long time. 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 He has a large wound on the left lateral leg with protruding muscle. He comes in the clinic today showing the same area on his left medial ankle. He says there is been a spot there for some time although we have not previously defined this. Today he has a clearly defined area with slight amount of skin breakdown surrounded by raised areas with a purplish hue in color. This is not painful he says it is irritated. 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 He still has muscle protruding from the lateral portion of his left leg but fortunately the new area that they were concerned about at his last visit does not appear to have opened at this point. He is currently on Remicade infusions and seems to be doing better in my opinion in fact the wound itself seems to be  overall much better. The purplish discoloration that he did have seems to have resolved and I think that is a good sign that hopefully the Remicade is doing its job. He does [place], [place] ([id number]) have some biofilm noted over the surface of the wound. 11/01/2019 on evaluation today patient's wound actually appears to be doing excellent at this time. Fortunately there is no evidence of active infection and overall I feel like he is making great progress. The Remicade seems to be due excellent job in my opinion. 11/08/19 evaluation today vision actually appears to be doing quite well with regard to his weight ulcer. He's been tolerating dressing changes without complication. Fortunately there is no evidence of infection. No fevers, chills, nausea, or vomiting noted at this time. Overall states that is having more itching than pain which is actually a good sign in my opinion. 12/13/2019 upon evaluation today patient appears to be doing well today with regard to his wound. He has been tolerating the dressing changes without complication. Fortunately there is no sign of active infection at this time. No fevers, chills, nausea, vomiting, or diarrhea. Overall I feel like the infusion therapy has been very beneficial for him. 01/06/2020 on evaluation today patient appears to be doing well with regard to his wound. This is measuring smaller and actually looks to be doing better. Fortunately there is no signs of active infection at this point. No fevers, chills, nausea, vomiting, or diarrhea. With that being said he does still have the blue-green drainage but this does not seem to be causing any significant issues currently. He has been using the gentamicin that does seem to be keeping things under decent control at this point. He goes later this morning for his next infusion therapy for the pyoderma which seems to also be very beneficial. 02/07/2020 on evaluation today patient appears to be doing about the  same in regard to his wounds currently. Fortunately there is no signs of active infection systemically he does still have evidence of local infection still using gentamicin. He also is showing some signs of improvement albeit slowly I do feel like we are making some progress here. 02/21/2020 upon evaluation today patient appears to be making some  signs of improvement the wound is measuring a little bit smaller which is great news and overall I am very pleased with where he stands currently. He is going to be having infusion therapy treatment on the 15th of this month. Fortunately there is no signs of active infection at this time. 03/13/2020 I do believe patient's wound is actually showing some signs of improvement here which is great news. He has continue with the infusion therapy through rheumatology/dermatology at [facility]. That does seem to be beneficial. I still think he gets as much benefit from this as he did from the prednisone initially but nonetheless obviously this is less harsh on his body that the prednisone as far as they are concerned. 03/31/2020 on evaluation today patient's wound actually showing signs of some pretty good improvement in regard to the overall appearance of the wound bed. There is still muscle exposed though he does have some epithelial growth around the edges of the wound. Fortunately there is no signs of active infection at this time. No fevers, chills, nausea, vomiting, or diarrhea. 04/24/2020 upon evaluation today patient appears to be doing about the same in regard to his leg ulcer. He has been tolerating the dressing changes without complication. Fortunately there is no signs of active infection at this time. No fevers, chills, nausea, vomiting, or diarrhea. With that being said he still has a lot of irritation from the bandaging around the edges of the wound. We did discuss today the possibility of a referral to plastic surgery. 05/22/2020 on evaluation today patient  appears to be doing well with regard to his wounds all things considered. He has not been able to get the Chantix apparently there is a recall nurse that I was unaware of put out by [employer] involuntarily. Nonetheless for now I am and I have to do some research into what may be the best option for him to help with quitting in regard to smoking and we discussed that today. 06/26/2020 upon evaluation today patient appears to be doing well with regard to his wound from the standpoint of infection I do not see any signs of infection at this point. With that being said unfortunately he is still continuing to have issues with muscle exposure and again he is not having a whole lot of new skin growth unfortunately. There does not appear to be any signs of active infection at this time. No fevers, chills, nausea, vomiting, or diarrhea. 07/10/2020 upon evaluation today patient appears to be doing a little bit more poorly currently compared to where he was previous. I am concerned currently about an active infection that may be getting worse especially in light of the increased size and tenderness of the wound bed. No fevers, chills, nausea, vomiting, or diarrhea. 07/24/2020 upon evaluation today patient appears to be doing poorly in regard to his leg ulcer. He has been tolerating the dressing changes without complication but unfortunately is having a lot of discomfort. 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 Subsequently I think he is going require possibly IV antibiotics to get this under control. I am very concerned about the severity of his infection and the amount of discomfort he is having. 07/31/2020 upon evaluation today patient appears to be doing about the same in regard to his leg wound. He did see Dr. Megan Salon and Dr. Megan Salon is actually going to start him on IV antibiotics. He goes for the PICC line tomorrow. With that being said  there do not have that run for 2 weeks and then see how things are doing and depending on how he is progressing they may extend that a little longer. Nonetheless I am glad this is getting ready to be in place and definitely feel it may help the patient. In the meantime is been using mainly triamcinolone to the wound bed has an anti-inflammatory. 08/07/2020 on evaluation today patient appears to be doing well with regard to his wound compared even last week. In the interim he has gotten the PICC line placed and overall this seems to be doing excellent. There does not appear to be any evidence of infection which is great news systemically although locally of course has had the infection this appears to be improving with the use of the antibiotics. 08/14/2020 upon evaluation today patient's wound actually showing signs of excellent improvement. Overall the irritation has significantly improved the drainage is back down to more of a normal level and his pain is really pretty much nonexistent compared to what it was. Obviously I think that this is significantly improved secondary to the IV antibiotic therapy which has made all the difference in the world. Again he had a resistant form of Pseudomonas for which oral antibiotics just was not cutting it. Nonetheless I do think that still we need to consider the possibility of a surgical closure for this wound is been open so long and to be honest with muscle exposed I think this can be very hard to get this to close outside of this although definitely were still working to try to do what we can in that regard. 08/21/2020 upon evaluation today patient appears to be doing very well with regard to his wounds on the left lateral lower extremity/calf area. Fortunately there does not appear to be signs of active infection which is great news and overall very pleased with where things stand today. He is actually wrapping up his treatment with IV antibiotics tomorrow.  After that we will see where things go from there. 08/28/2020 upon evaluation today patient appears to be doing decently well with regard to his leg ulcer. There does not appear to be any signs of [place], [place] ([id number]) active infection which is great news and overall very pleased with where things stand today. No fevers, chills, nausea, vomiting, or diarrhea. 09/18/2020 upon evaluation today patient appears to be doing well with regard to his infection which I feel like is better. Unfortunately he is not doing as well with regard to the overall size of the wound which is not nearly as good at this point. I feel like that he may be having an issue here with the pyoderma being somewhat out of control. I think that he may benefit from potentially going back and talking to the dermatologist about what to do from the pyoderma standpoint. I am not certain if the infusions are helping nearly as much is what the prednisone did in the past. 10/02/2020 upon evaluation today patient appears to be doing well with regard to his leg ulcer. He did go to the [facility]iatric nurse. Unfortunately they feel like there is a 10% chance that most that he would be able to heal and that the skin graft would take. Obviously this has led him to not be able to go down that path as far as treatment is concerned. Nonetheless he does seem to be doing a little bit better with the prednisone that I gave him last time. I think that he may  need to discuss with dermatology the possibility of long-term prednisone as that seems to be what is most helpful for him to be perfectly honest. I am not sure the Remicade is really doing the job. 10/17/2020 upon evaluation today patient appears to be doing a little better in regard to his wound. In fact the case has been since we did the prednisone on May 2 for him that we have noticed a little bit of improvement each time we have seen a size wise as well as appearance wise as well as pain wise. I think  the prednisone has had a greater effect then the infusion therapy has to be perfectly honest. With that being said the patient also feels significantly better compared to what he was previous. All of this is good news but nonetheless I am still concerned about the fact that again we are really not set up to long-term manage him as far as prednisone is concerned. Obviously there are things that you need to be watched I completely understand the risk of prednisone usage as well. 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 With all that being said I do believe that we can give him another round of the prednisone which she is requesting today because of the improvement that he seen since we did that first round. 10/30/2020 upon evaluation today patient's wound actually is showing signs of doing quite well. There does not appear to be any evidence of infection which is great news and overall very pleased with where things stand today. No fevers, chills, nausea, vomiting, or diarrhea. He tells me that the prednisone still has seem to have helped he wonders if we can extend that for just a little bit longer. He did not have the appointment with a dermatologist although he did have an infusion appointment last Friday. That was at [facility]. With that being said he tells me he could not do both that as well as the appointment with the physician on the same day therefore that is can have to be rescheduled. I really want to see if there is anything they feel like that could be done differently to try to help this out as I am not really certain that the infusions are helping significantly here. 11/13/2020 upon evaluation today patient unfortunately appears to be doing somewhat poorly in regard to his wound I feel like this is actually worsening from the standpoint of the pyoderma spreading. I still feel like that he may need something different as far as trying to manage  this going forward. Again we did the prednisone unfortunately his blood sugars are not doing so well because of this. Nonetheless I believe that the patient likely needs to try topical steroid. We have done triamcinolone for a while I think going with something stronger such as clobetasol could be beneficial again this is not something I do lightly I discussed this with the patient that again this does not normally put underneath an occlusive dressing. Nonetheless I think a thin film as such could help with some of the stronger anti-inflammatory effects. We discussed this today. He would like to try to give this a trial for the next couple weeks. I definitely think that is something that we can do. Evaluate7/03/2021 and today patient's wound bed actually showed signs of doing really about the same. There was a little expansion of the size of the wound and that leading edge that we done  looking out although the clobetasol does seem to have slowed this down a bit in my opinion. There is just 1 small area that still seems to be progressing based on what I see. Nonetheless I am concerned about the fact this does not seem to be improving if anything seems to be doing a little bit worse. I do not know that the infusions are really helping him much as next infusion is August 5 his appointment with dermatology is July 25. Either way I really think that we need to have a conversation potentially about this and I am actually going to see if I can talk with Dr. Lillia Carmel in order to see where things stand as well. 12/11/2020 upon evaluation today patient appears to be doing worse in regard to his leg ulcer. Unfortunately I just do not think this is making the progress that I would like to see at this point. Honestly he does have an appointment with dermatology and this is in 2 days. I am wondering what they may have to offer to help with this. Right now what I am seeing is that he is continuing to show signs of  worsening little by little. Obviously that is not great at all. Is the exact opposite of what we are looking for. 12/18/2020 upon evaluation today patient appears to be doing a little better in regard to his wound. The dermatologist actually did do some steroid injections into the wound which does seem to have been beneficial in my opinion. That was on the 25th already this looks a little better to me than last time I saw him. With that being said we did do a culture and this did show that he has Staph aureus noted in abundance in the wound. With that being said I do think that getting him on an oral antibiotic would be appropriate as well. Also think we can compression wrap and this will make a difference as well. 12/28/2020 upon evaluation today patient's wound is actually showing signs of doing much better. I do believe the compression wrap is helping he has a lot of drainage but to be honest I think that the compression is helping to some degree in this regard as well as not draining through which is also good news. No fevers, chills, nausea, vomiting, or diarrhea. 01/04/2021 upon evaluation today patient appears to be doing well with regard to his wound. Overall things seem to be doing quite well. He did have a little bit of reaction to the CarboFlex Sorbact he will be using that any longer. With that being said he is controlled as far as the drainage is concerned overall and seems to be doing quite well. I do not see any signs of active infection at this time which is great news. No fevers, chills, nausea, vomiting, or diarrhea. 01/11/2021 upon evaluation today patient appears to be doing well with regard to his wounds. He has been tolerating the dressing changes without complication. Fortunately there does not appear to be any signs of active infection at this time which is great news. Overall I am extremely pleased with where we stand currently. No fevers, chills, nausea, vomiting, or diarrhea.  Where using clobetasol in the wound bed he has a lot of new skin growth which is awesome as well. 01/18/2021 upon evaluation today patient appears to be doing very well in regard to his leg ulcer. He has been tolerating the dressing changes without complication. Fortunately there does not appear to be any  signs of active infection which is great news. In general I think that he is making excellent progress 01/25/2021 upon evaluation today patient appears to be doing well with regard to his wound on the leg. I am actually extremely pleased with where things stand today. There does not appear to be any signs of active infection which is great news and overall I think that we are definitely headed in the appropriate direction based on what I am seeing currently. There does not appear to be any signs of active infection also excellent news. 02/06/2021 upon evaluation today patient appears to be doing well with regard to his wound. Overall visually this is showing signs of significant Albino, Yomar ([id number]) improvement which is great news. I do not see any signs of active infection systemically which is great even locally I do not think that we are seeing any major complications here. We did do fluorescence imaging with the [employer] DX today. The patient does have some odor and drainage noted and again this is something that I think would benefit him to probably come more frequently for nurse visits. 02/19/2021 upon evaluation today patient actually appears to be doing quite well in regard to his wound. He has been tolerating the dressing changes without complication and overall I think that this is making excellent progress. I do not see any evidence of active infection at this point which is great news as well. No fevers, chills, nausea, vomiting, or diarrhea. 10/10; wound is made nice progress healthy granulation with a nice rim of epithelialization which seems to be expanding even from last week  he has a deeper area in the inferior part of the more distal part of the wound with not quite as healthy as surface. This area will need to be followed. Using clobetasol and Hydrofera Blue 03/05/2021 upon evaluation today patient appears to be doing very well in regard to his leg ulcer. He has been tolerating dressing changes without complication. Fortunately there does not appear to be any signs of active infection which is great news and overall I am extremely pleased with where we stand currently. 03/12/2021 upon evaluation today patient appears to be doing well with regard to his wound in fact this is extremely extremely good based on what we are seeing today there does not appear to be any signs of active infection and overall I think that he is doing awesome from the standpoint of healing in general. I am extremely pleased with how things seem to be progressing with regard to this pyoderma. Clobetasol has done wonders for him. I think the compression wrapping has also been of great benefit. 03/19/2021 upon evaluation today patient appears to be doing well with regard to his wound. He is tolerating the dressing changes without complication. In fact I feel like that he is actually making excellent progress at this point based on what I am seeing. No fevers, chills, nausea, vomiting, or diarrhea. 03/26/2021 upon evaluation today patient appears to be doing well with regard to his wound. This again is measuring smaller and looking better. Again the progress is slow but nonetheless continual with what we have been seeing. I do believe that the current plan is doing awesome for him. 04/09/2021 upon evaluation today patient appears to be doing well with regard to his leg ulcer. This is showing signs of excellent improvement the muscle is completely closed over and there does not appear to be any evidence of inflammation at this point his drainage is significantly  improved. Overall I think that he  would be a good candidate for looking into a skin substitute at this point as well. We will get a look into some approvals in that regard. Potentially TheraSkin as well as Apligraf could both be considered just depending on insurance coverage. 04/16/2021 upon evaluation today patient appears to be doing well at this point. He has been approved for the Apligraf which we could definitely order although I would like to try to get the TheraSkin approved if at all possible. I did fax notes into them today and I Georgina Peer try to give a call as well. Overall the wound appears to be doing decently well today. Objective Constitutional Well-nourished and well-hydrated in no acute distress. Vitals Time Taken: 8:22 AM, Height: 71 in, Weight: 338 lbs, BMI: 47.1, Temperature: 98.7 F, Pulse: 102 bpm, Respiratory Rate: 16 breaths/min, Blood Pressure: 140/97 mmHg. Respiratory normal breathing without difficulty. Psychiatric this patient is able to make decisions and demonstrates good insight into disease process. Alert and Oriented x 3. pleasant and cooperative. General Notes: Upon inspection patient's wound bed showed signs of good granulation epithelization at this point. Fortunately there does not appear to be any signs of active infection which is great and overall I am extremely happy with where we stand today. I do believe however he will be benefited by getting the TheraSkin in place obviously I think that the TheraSkin having the structure would be even better than the Apligraf that I think either could be of benefit for him I really want to try to get this closed as soon as possible. Integumentary (Hair, Skin) Wound #1 status is Open. Original cause of wound was Gradually Appeared. The date acquired was: 11/18/2015. The wound has been in treatment 227 weeks. The wound is located on the Left,Lateral Lower Leg. The wound measures 5.4cm length x 2.2cm width x 0.4cm depth; 9.331cm^2 area and 3.732cm^3 volume.  There is Fat Layer (Subcutaneous Tissue) exposed. There is a small amount of serosanguineous drainage noted. The wound margin is thickened. There is large (67-100%) red, hyper - granulation within the wound bed. There is a small (1-33%) amount of necrotic tissue within the wound bed including Adherent Slough. 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 Nurse Visit as needed - twice a week Bathing/ Shower/ Hygiene: Clean wound with Normal Saline or wound cleanser. Other: - Apply dakins soaked gauze on wound after cleansing wound IN OFFICE ONLY Edema Control - Lymphedema / Segmental Compressive Device / Other: Optional: One layer of unna paste to top of compression wrap (to act as an anchor). Elevate, Exercise Daily and Avoid Standing for Long Periods of Time. Elevate legs to the level of the heart and pump ankles as often as possible Elevate leg(s) parallel to the floor when sitting. WOUND #1: - Lower Leg Wound Laterality: Left, Lateral Cleanser: Wound Cleanser 3 x Per Week/30 Days Discharge Instructions: Wash your hands with soap and water. Remove old dressing, discard into plastic bag and place into trash. Cleanse the wound with Wound Cleanser prior to applying a clean dressing using gauze sponges, not tissues or cotton balls. Do not scrub or use excessive force. Pat dry using gauze sponges, not tissue or cotton balls. Peri-Wound Care: Moisturizing Lotion 3 x Per Week/30 Days  Discharge Instructions: Suggestions: Theraderm, Eucerin, Cetaphil, or patient preference. Topical: Clobetasol Propionate ointment 0.05%, 60 (g) tube 3 x Per Week/30 Days Discharge Instructions:  Apply to 7 o'clock depth Primary Dressing: Hydrofera Blue Ready Transfer Foam, 4x5 (in/in) 3 x Per Week/30 Days Discharge Instructions: Apply Hydrofera Blue Ready to wound bed as directed Secondary Dressing: Xtrasorb Medium 4x5 (in/in) 3 x Per Week/30 Days Discharge Instructions: Apply to wound as directed. Do not cut. Compression Wrap: Medichoice 4 layer Compression System, 35-40 mmHG (Generic) 3 x Per Week/30 Days Discharge Instructions: Apply multi-layer wrap as directed. 1. Would recommend currently that going to continue with the wound care measures as before utilizing the [facility]. 2. We will continue with XtraSorb to cover. 3. I am also can recommend we continue with a 4-layer compression wrap. 4. I will see what we want to do from a skin substandpoint 1 where another we will have something next week to apply and get started for him as soon as possible. We will see patient back for reevaluation in 1 week here in the clinic. If anything worsens or changes patient will contact our office for additional recommendations. Electronic Signature(s) Signed: 04/16/2021 5:11:06 PM By: Worthy Keeler PA-C Entered By: Worthy Keeler on 04/16/2021 17:11:06 [name] ([id number]) -------------------------------------------------------------------------------- SuperBill Details Patient Name: [name] Date of Service: 04/16/2021 Medical Record Number: [id number] Patient Account Number: [id number] Date of Birth/Sex: July 29, 1980 (40 y.o. M) Treating RN: [name] Primary Care Provider: [name] Other Clinician: Referring Provider: [name] Treating Provider/Extender: [name] Cliche in Treatment: 227 Diagnosis Coding ICD-10 Codes Code Description I87.2 Venous insufficiency (chronic) (peripheral) L97.222 Non-pressure chronic ulcer of left calf with fat layer exposed E11.622 Type 2 diabetes mellitus with other skin ulcer L88 Pyoderma gangrenosum F17.208 Nicotine  dependence, unspecified, with other nicotine-induced disorders Facility Procedures CPT4 Code: [id number] Description: (Facility Use Only) [phone] - [facility] L[facility] LT LEG Modifier: Quantity: 1 Physician Procedures CPT4 Code: 0037048 Description: 88916 - WC PHYS LEVEL 4 - EST PT Modifier: Quantity: 1 CPT4 Code: Description: ICD-10 Diagnosis Description I87.2 Venous insufficiency (chronic) (peripheral) L97.222 Non-pressure chronic ulcer of left calf with fat layer exposed E11.622 Type 2 diabetes mellitus with other skin ulcer L88 Pyoderma gangrenosum Modifier: Quantity: Electronic Signature(s) Signed: 04/16/2021 5:11:22 PM By: Worthy Keeler PA-C Entered By: Worthy Keeler on 04/16/2021 17:11:22

## 2021-04-16 NOTE — Telephone Encounter (Signed)
Left message to return call to our office.  

## 2021-04-17 ENCOUNTER — Encounter: Payer: 59 | Admitting: Physician Assistant

## 2021-04-17 NOTE — Unmapped (Signed)
[facility]

## 2021-04-17 NOTE — Progress Notes (Signed)
HILLMAN, ATTIG ([id number]) Visit Report for 04/16/2021 Arrival Information Details Patient Name: [name], [name] Date of Service: 04/16/2021 8:15 AM Medical Record Number: [id number] Patient Account Number: [id number] Date of Birth/Sex: 1980/10/13 (40 y.o. M) Treating RN: [name] Primary Care Jahdiel Krol: [name] Other Clinician: Referring Cheyenne Schumm: [name] Treating Jakyle Petrucelli/Extender: [name] Cliche in Treatment: 46 Visit Information History Since Last Visit Added or deleted any medications: No Patient Arrived: Ambulatory Has Dressing in Place as Prescribed: Yes Arrival Time: 08:22 Has Compression in Place as Prescribed: Yes Transfer Assistance: None Pain Present Now: No Patient Identification Verified: Yes Secondary Verification Process Completed: Yes Patient Requires Transmission-Based No Precautions: Patient Has Alerts: Yes Patient Alerts: Patient has reaction to silver dressings. Electronic Signature(s) Signed: 04/17/2021 9:26:19 AM By: [name], [name], RN, [name], [name], [name] Entered By: [name], [name], RN, [name], [name] on 04/16/2021 08:22:54 ANOOP, HEMMER ([id number]) -------------------------------------------------------------------------------- Encounter Discharge Information Details Patient Name: [name] Date of Service: 04/16/2021 8:15 AM Medical Record Number: [id number] Patient Account Number: [id number] Date of Birth/Sex: 25-Nov-1980 (40 y.o. M) Treating RN: [name] Primary Care Eddith Mentor: [name] Other Clinician: Referring Dorse Locy: [name] Treating Analese Sovine/Extender: [name] Cliche in Treatment: 227 Encounter Discharge Information Items Discharge Condition: Stable Ambulatory Status: Ambulatory Discharge Destination: Home Transportation: Private Auto Accompanied By: self Schedule Follow-up Appointment: Yes Clinical Summary of Care: Electronic Signature(s) Signed: 04/17/2021 9:26:19 AM By: [name], [name], RN, [name], [name],  [name] Entered By: [name], [name], RN, [name], [name] on 04/16/2021 08:57:00 [name] ([id number]) -------------------------------------------------------------------------------- Lower Extremity Assessment Details Patient Name: [name] Date of Service: 04/16/2021 8:15 AM Medical Record Number: [id number] Patient Account Number: [id number] Date of Birth/Sex: Jun 14, 1980 (40 y.o. M) Treating RN: [name] Primary Care Sharlyn Odonnel: [name] Other Clinician: Referring Adaly Puder: [name] Treating Bryceson Grape/Extender: [name] Cliche in Treatment: 227 Edema Assessment Assessed: [Left: No] [Right: No] [Left: Edema] [Right: :] Calf Left: Right: Point of Measurement: 33 cm From Medial Instep 47.6 cm Ankle Left: Right: Point of Measurement: 12 cm From Medial Instep 29.6 cm Vascular Assessment Pulses: Dorsalis Pedis Palpable: [Left:Yes] Electronic Signature(s) Signed: 04/17/2021 9:26:19 AM By: [name], [name], RN, [name], [name], [name] Entered By: [name], [name], RN, [name], [name] on 04/16/2021 08:32:40 [name] ([id number]) -------------------------------------------------------------------------------- Multi Wound Chart Details Patient Name: [name] Date of Service: 04/16/2021 8:15 AM Medical Record Number: [id number] Patient Account Number: [id number] Date of Birth/Sex: December 10, 1980 (40 y.o. M) Treating RN: [name] Primary Care Kambrey Hagger: [name] Other Clinician: Referring Sheriff Rodenberg: [name] Treating Seana Underwood/Extender: [name] Cliche in Treatment: 227 Vital Signs Height(in): 71 Pulse(bpm): 102 Weight(lbs): 338 Blood Pressure(mmHg): 140/97 Body Mass Index(BMI): 47 Temperature(F): 98.7 Respiratory Rate(breaths/min): 16 Photos: [N/A:N/A] Wound Location: Left, Lateral Lower Leg N/A N/A Wounding Event: Gradually Appeared N/A N/A Primary Etiology: Pyoderma N/A N/A Comorbid History: Sleep Apnea, Hypertension, Colitis N/A N/A Date Acquired: 11/18/2015 N/A  N/A Weeks of Treatment: 227 N/A N/A Wound Status: Open N/A N/A Measurements L x W x D (cm) 5.4x2.2x0.4 N/A N/A Area (cm) : 9.331 N/A N/A Volume (cm) : 3.732 N/A N/A % Reduction in Area: -90.10% N/A N/A % Reduction in Volume: 5.00% N/A N/A Classification: Full Thickness With Exposed N/A N/A Support Structures Exudate Amount: Small N/A N/A Exudate Type: Serosanguineous N/A N/A Exudate Color: red, brown N/A N/A Wound Margin: Thickened N/A N/A Granulation Amount: Large (67-100%) N/A N/A Granulation Quality: Red, Hyper-granulation N/A N/A Necrotic Amount: Small (1-33%) N/A N/A Exposed Structures: Fat Layer (Subcutaneous Tissue): N/A N/A Yes Muscle: No Treatment Notes Electronic Signature(s)  Signed: 04/17/2021 9:26:19 AM By: [name], [name], RN, [name], [name], [name] Entered By: [name], [name], RN, [name], [name] on 04/16/2021 08:41:35 SHJON, LIZARRAGA ([id number]) -------------------------------------------------------------------------------- Multi-Disciplinary Care Plan Details Patient Name: [name] Date of Service: 04/16/2021 8:15 AM Medical Record Number: [id number] Patient Account Number: [id number] Date of Birth/Sex: 30-Aug-1980 (40 y.o. M) Treating RN: [name] Primary Care Izzy Courville: [name] Other Clinician: Referring Jarelyn Bambach: [name] Treating Hodari Chuba/Extender: [name] Cliche in Treatment: [place] reviewed with physician Active Inactive Necrotic Tissue Nursing Diagnoses: Impaired tissue integrity related to necrotic/devitalized tissue Knowledge deficit related to management of necrotic/devitalized tissue Goals: Necrotic/devitalized tissue will be minimized in the wound bed Date Initiated: 03/26/2021 Target Resolution Date: 03/26/2021 Goal Status: Active Patient/caregiver will verbalize understanding of reason and process for debridement of necrotic tissue Date Initiated: 03/26/2021 Target Resolution Date: 03/26/2021 Goal Status:  Active Interventions: Assess patient pain level pre-, during and post procedure and prior to discharge Provide education on necrotic tissue and debridement process Treatment Activities: Apply topical anesthetic as ordered : 03/26/2021 Notes: Electronic Signature(s) Signed: 04/17/2021 9:26:19 AM By: [name], [name], RN, [name], [name], [name] Entered By: [name], [name], RN, [name], [name] on 04/16/2021 08:41:25 [name] ([id number]) -------------------------------------------------------------------------------- Pain Assessment Details Patient Name: [name] Date of Service: 04/16/2021 8:15 AM Medical Record Number: [id number] Patient Account Number: [id number] Date of Birth/Sex: [birth year] (40 y.o. M) Treating RN: [name] Primary Care Adolfo Granieri: [name] Other Clinician: Referring Ana Woodroof: [name] Treating Artemus Romanoff/Extender: [name] Cliche in Treatment: 227 Active Problems Location of Pain Severity and Description of Pain Patient Has Paino No Site Locations Pain Management and Medication Current Pain Management: Electronic Signature(s) Signed: 04/17/2021 9:26:19 AM By: [name], [name], RN, [name], [name], [name] Entered By: [name], [name], RN, [name], [name] on 04/16/2021 08:27:05 [name] ([id number]) -------------------------------------------------------------------------------- Patient/Caregiver Education Details Patient Name: [name] Date of Service: 04/16/2021 8:15 AM Medical Record Number: [id number] Patient Account Number: [id number] Date of Birth/Gender: 1981-01-27 (40 y.o. M) Treating RN: [name] Primary Care Physician: [name] Other Clinician: Referring Physician: [name] Treating Physician/Extender: [name] Cliche in Treatment: 227 Education Assessment Education Provided To: Patient Education Topics Provided Venous: Handouts: Controlling Swelling with Multilayered Compression Wraps Methods: Demonstration, Explain/Verbal Responses: State  content correctly Electronic Signature(s) Signed: 04/17/2021 9:26:19 AM By: [name], [name], RN, [name], [name], [name] Entered By: [name], [name], RN, [name], [name] on 04/16/2021 08:56:16 [name] ([id number]) -------------------------------------------------------------------------------- Wound Assessment Details Patient Name: [name] Date of Service: 04/16/2021 8:15 AM Medical Record Number: [id number] Patient Account Number: [id number] Date of Birth/Sex: 29-Apr-1981 (40 y.o. M) Treating RN: [name] Primary Care Brad Mcgaughy: [name] Other Clinician: Referring Sumaya Riedesel: [name] Treating Paislie Tessler/Extender: [name] Cliche in Treatment: 227 Wound Status Wound Number: 1 Primary Etiology: Pyoderma Wound Location: Left, Lateral Lower Leg Wound Status: Open Wounding Event: Gradually Appeared Comorbid History: Sleep Apnea, Hypertension, Colitis Date Acquired: 11/18/2015 Weeks Of Treatment: 227 Clustered Wound: No Photos Wound Measurements Length: (cm) 5.4 Width: (cm) 2.2 Depth: (cm) 0.4 Area: (cm) 9.331 Volume: (cm) 3.732 % Reduction in Area: -90.1% % Reduction in Volume: 5% Wound Description Classification: Full Thickness With Exposed Support Structures Wound Margin: Thickened Exudate Amount: Small Exudate Type: Serosanguineous Exudate Color: red, brown Foul Odor After Cleansing: No Slough/Fibrino Yes Wound Bed Granulation Amount: Large (67-100%) Exposed Structure Granulation Quality: Red, Hyper-granulation Fat Layer (Subcutaneous Tissue) Exposed: Yes Necrotic Amount: Small (1-33%) Muscle Exposed: No Necrotic Quality: Adherent Slough Treatment Notes Wound #1 (Lower Leg) Wound Laterality: Left, Lateral  Cleanser Wound Cleanser Discharge Instruction: Wash your hands with soap and water. Remove old dressing, discard into plastic bag and place into trash. Cleanse the wound with Wound Cleanser prior to applying a clean dressing using gauze sponges, not tissues or  cotton balls. Do not scrub or use excessive force. Pat dry using gauze sponges, not tissue or cotton balls. Peri-Wound Care Moisturizing LIRON, EISSLER ([id number]) Discharge Instruction: Suggestions: Theraderm, Eucerin, Cetaphil, or patient preference. Topical Clobetasol Propionate ointment 0.05%, 60 (g) tube Discharge Instruction: Apply to 7 o'clock depth Primary Dressing Hydrofera Blue Ready Transfer Foam, 4x5 (in/in) Discharge Instruction: Apply Hydrofera Blue Ready to wound bed as directed Secondary Dressing Xtrasorb Medium 4x5 (in/in) Discharge Instruction: Apply to wound as directed. Do not cut. Secured With Compression Wrap Medichoice 4 layer Compression System, 35-40 mmHG Discharge Instruction: Apply multi-layer wrap as directed. Compression Stockings Environmental education officer) Signed: 04/17/2021 9:26:19 AM By: [name], [name], RN, [name], [name], [name] Entered By: [name], [name], RN, [name], [name] on 04/16/2021 08:30:02 KENDARIUS, VIGEN ([id number]) -------------------------------------------------------------------------------- Vitals Details Patient Name: [name] Date of Service: 04/16/2021 8:15 AM Medical Record Number: [id number] Patient Account Number: [id number] Date of Birth/Sex: 1980/07/13 (40 y.o. M) Treating RN: [name] Primary Care Krist Rosenboom: [name] Other Clinician: Referring Nysir Fergusson: [name] Treating Arieonna Medine/Extender: [name] Cliche in Treatment: 227 Vital Signs Time Taken: 08:22 Temperature (F): 98.7 Height (in): 71 Pulse (bpm): 102 Weight (lbs): 338 Respiratory Rate (breaths/min): 16 Body Mass Index (BMI): 47.1 Blood Pressure (mmHg): 140/97 Reference Range: 80 - 120 mg / dl Electronic Signature(s) Signed: 04/17/2021 9:26:19 AM By: [name], [name], RN, [name], [name], [name] Entered By: [name], [name], RN, [name], [name] on 04/16/2021 08:23:47

## 2021-04-18 ENCOUNTER — Other Ambulatory Visit: Payer: Self-pay

## 2021-04-18 DIAGNOSIS — E11622 Type 2 diabetes mellitus with other skin ulcer: Secondary | ICD-10-CM | POA: Diagnosis not present

## 2021-04-18 NOTE — Progress Notes (Signed)
JAVIUS, SYLLA ([id number]) Visit Report for 04/18/2021 Arrival Information Details Patient Name: [name], [name] Date of Service: 04/18/2021 8:00 AM Medical Record Number: [id number] Patient Account Number: [id number] Date of Birth/Sex: Feb 28, 1981 (40 y.o. M) Treating RN: Donnamarie Poag Primary Care Jasean Ambrosia: [name] Other Clinician: Referring Jhase Creppel: [name] Treating Joeanthony Seeling/Extender: Yaakov Guthrie in Treatment: 228 Visit Information History Since Last Visit Added or deleted any medications: No Patient Arrived: Ambulatory Had a fall or experienced change in No Arrival Time: 08:05 activities of daily living that may affect Accompanied By: self risk of falls: Transfer Assistance: None Hospitalized since last visit: No Patient Identification Verified: Yes Has Dressing in Place as Prescribed: Yes Secondary Verification Process Completed: Yes Has Compression in Place as Prescribed: Yes Patient Requires Transmission-Based No Pain Present Now: No Precautions: Patient Has Alerts: Yes Patient Alerts: Patient has reaction to silver dressings. Electronic Signature(s) Signed: 04/18/2021 2:54:54 PM By: Donnamarie Poag Entered By: Donnamarie Poag on 04/18/2021 08:30:00 [name] ([id number]) -------------------------------------------------------------------------------- Clinic Level of Care Assessment Details Patient Name: [name], [name] Date of Service: 04/18/2021 8:00 AM Medical Record Number: [id number] Patient Account Number: [id number] Date of Birth/Sex: 1981/01/06 (40 y.o. M) Treating RN: Donnamarie Poag Primary Care Almira [place]: [name] Other Clinician: Referring Macy Lingenfelter: [name] Treating Cleva Camero/Extender: Yaakov Guthrie in Treatment: 228 Clinic Level of Care Assessment Items TOOL 1 Quantity Score []  - Use when EandM and Procedure is performed on INITIAL visit 0 ASSESSMENTS - Nursing Assessment / Reassessment []  - General Physical Exam  (combine w/ comprehensive assessment (listed just below) when performed on new 0 pt. evals) []  - 0 Comprehensive Assessment (HX, ROS, Risk Assessments, Wounds Hx, etc.) ASSESSMENTS - Wound and Skin Assessment / Reassessment []  - Dermatologic / Skin Assessment (not related to wound area) 0 ASSESSMENTS - Ostomy and/or Continence Assessment and Care []  - Incontinence Assessment and Management 0 []  - 0 Ostomy Care Assessment and Management (repouching, etc.) PROCESS - Coordination of Care []  - Simple Patient / Family Education for ongoing care 0 []  - 0 Complex (extensive) Patient / Family Education for ongoing care []  - 0 Staff obtains Programmer, systems, Records, Test Results / Process Orders []  - 0 Staff telephones HHA, Nursing Homes / Clarify orders / etc []  - 0 Routine Transfer to another Facility (non-emergent condition) []  - 0 Routine Hospital Admission (non-emergent condition) []  - 0 New Admissions / Biomedical engineer / Ordering NPWT, Apligraf, etc. []  - 0 Emergency Hospital Admission (emergent condition) PROCESS - Special Needs []  - Pediatric / Minor Patient Management 0 []  - 0 Isolation Patient Management []  - 0 Hearing / Language / Visual special needs []  - 0 Assessment of Community assistance (transportation, D/C planning, etc.) []  - 0 Additional assistance / Altered mentation []  - 0 Support Surface(s) Assessment (bed, cushion, seat, etc.) INTERVENTIONS - Miscellaneous []  - External ear exam 0 []  - 0 Patient Transfer (multiple staff / Civil Service fast streamer / Similar devices) []  - 0 Simple Staple / Suture removal (25 or less) []  - 0 Complex Staple / Suture removal (26 or more) []  - 0 Hypo/Hyperglycemic Management (do not check if billed separately) []  - 0 Ankle / Brachial Index (ABI) - do not check if billed separately Has the patient been seen at the hospital within the last three years: Yes Total Score: 0 Level Of Care: ____ [name] ([id number]) Electronic  Signature(s) Signed: 04/18/2021 2:54:54 PM By: Donnamarie Poag Entered By: Donnamarie Poag on 04/18/2021 08:32:32 [place], [name] ([id number]) -------------------------------------------------------------------------------- Compression Therapy Details Patient Name: [name]  Date of Service: 04/18/2021 8:00 AM Medical Record Number: [id number] Patient Account Number: [id number] Date of Birth/Sex: August 16, 1980 (40 y.o. M) Treating RN: Donnamarie Poag Primary Care Ajit Errico: [name] Other Clinician: Referring Molley Houser: [name] Treating Brittanny Levenhagen/Extender: Yaakov Guthrie in Treatment: 228 Compression Therapy Performed for Wound Assessment: Wound #1 Left,Lateral Lower Leg Performed By: Junius Argyle, RN Compression Type: Four Layer Electronic Signature(s) Signed: 04/18/2021 2:54:54 PM By: Donnamarie Poag Entered By: Donnamarie Poag on 04/18/2021 08:30:45 [name] ([id number]) -------------------------------------------------------------------------------- Encounter Discharge Information Details Patient Name: [name] Date of Service: 04/18/2021 8:00 AM Medical Record Number: [id number] Patient Account Number: [id number] Date of Birth/Sex: May 24, 1980 (40 y.o. M) Treating RN: Donnamarie Poag Primary Care Camyah Pultz: [name] Other Clinician: Referring Fia Hebert: [name] Treating Anahit Klumb/Extender: Yaakov Guthrie in Treatment: 228 Encounter Discharge Information Items Discharge Condition: Stable Ambulatory Status: Ambulatory Discharge Destination: Home Transportation: Private Auto Accompanied By: self Schedule Follow-up Appointment: Yes Clinical Summary of Care: Electronic Signature(s) Signed: 04/18/2021 2:54:54 PM By: Donnamarie Poag Entered By: Donnamarie Poag on 04/18/2021 08:32:16 [name] ([id number]) -------------------------------------------------------------------------------- Lower Extremity Assessment Details Patient Name: [name] Date of Service: 04/18/2021 8:00 AM Medical Record Number: [id number] Patient Account Number: [id number] Date of Birth/Sex: Jun 15, 1980 (40 y.o. M) Treating RN: Donnamarie Poag Primary Care Laydon Martis: [name] Other Clinician: Referring Ellieanna Funderburg: [name] Treating Tadao Emig/Extender: Yaakov Guthrie in Treatment: 228 Edema Assessment Assessed: Shirlyn Goltz: Yes] [name]: No] [Left: Edema] [Right: :] Vascular Assessment Blood Pressure: Brachial: [Left:122] Ankle: [Left:Dorsalis Pedis: 148 1.21] Electronic Signature(s) Signed: 04/18/2021 2:54:54 PM By: Donnamarie Poag Entered By: Donnamarie Poag on 04/18/2021 08:31:32 [place] ([id number]) -------------------------------------------------------------------------------- Wound Assessment Details Patient Name: [name] Date of Service: 04/18/2021 8:00 AM Medical Record Number: [id number] Patient Account Number: [id number] Date of Birth/Sex: [birth year] (40 y.o. M) Treating RN: Donnamarie Poag Primary Care Timiya Howells: [name] Other Clinician: Referring Gwynn Chalker: [name] Treating Zoe Goonan/Extender: Yaakov Guthrie in Treatment: 228 Wound Status Wound Number: 1 Primary Etiology: Pyoderma Wound Location: Left, Lateral Lower Leg Wound Status: Open Wounding Event: Gradually Appeared Comorbid History: Sleep Apnea, Hypertension, Colitis Date Acquired: 11/18/2015 Weeks Of Treatment: 228 Clustered Wound: No Wound Measurements Length: (cm) 5.4 Width: (cm) 2.2 Depth: (cm) 0.4 Area: (cm) 9.331 Volume: (cm) 3.732 % Reduction in Area: -90.1% % Reduction in Volume: 5% Wound Description Classification: Full Thickness With Exposed Support Structures Wound Margin: Thickened Exudate Amount: Small Exudate Type: Serosanguineous Exudate Color: red, brown Foul Odor After Cleansing: No Slough/Fibrino Yes Wound Bed Granulation Amount: Large (67-100%) Exposed Structure Granulation Quality: Red,  Hyper-granulation Fat Layer (Subcutaneous Tissue) Exposed: Yes Necrotic Amount: Small (1-33%) Muscle Exposed: No Necrotic Quality: Adherent Slough Treatment Notes Wound #1 (Lower Leg) Wound Laterality: Left, Lateral Cleanser Wound Cleanser Discharge Instruction: Wash your hands with soap and water. Remove old dressing, discard into plastic bag and place into trash. Cleanse the wound with Wound Cleanser prior to applying a clean dressing using gauze sponges, not tissues or cotton balls. Do not scrub or use excessive force. Pat dry using gauze sponges, not tissue or cotton balls. Peri-Wound Care Moisturizing Lotion Discharge Instruction: Suggestions: Theraderm, Eucerin, Cetaphil, or patient preference. Topical Clobetasol Propionate ointment 0.05%, 60 (g) tube Discharge Instruction: Apply to 7 o'clock depth Primary Dressing Hydrofera Blue Ready Transfer Foam, 4x5 (in/in) Discharge Instruction: Apply Hydrofera Blue Ready to wound bed as directed Secondary Dressing Xtrasorb Medium 4x5 (in/in) Discharge Instruction: Apply to wound as directed. Do not cut. ZAE, KIRTZ ([id number]) Secured With Compression Wrap Medichoice 4 layer Compression System, 35-40  mmHG Discharge Instruction: Apply multi-layer wrap as directed. Compression Stockings Add-Ons Electronic Signature(s) Signed: 04/18/2021 2:54:54 PM By: Donnamarie Poag Entered ByDonnamarie Poag on 04/18/2021 08:30:26

## 2021-04-19 ENCOUNTER — Ambulatory Visit: Admit: 2021-04-19 | Discharge: 2021-04-20 | Payer: PRIVATE HEALTH INSURANCE

## 2021-04-19 DIAGNOSIS — L720 Epidermal cyst: Principal | ICD-10-CM

## 2021-04-19 NOTE — Unmapped (Signed)
Patient presents for suture removal.  Sutures were placed in the [facility] on 04/09/2021.  Patient was instructed to return in 10-14 days for removal.   The incision line is well approximated, well healed and without redness or drainage.

## 2021-04-20 ENCOUNTER — Encounter: Payer: 59 | Attending: Physician Assistant | Admitting: Physician Assistant

## 2021-04-20 ENCOUNTER — Other Ambulatory Visit: Payer: Self-pay

## 2021-04-20 DIAGNOSIS — F17208 Nicotine dependence, unspecified, with other nicotine-induced disorders: Secondary | ICD-10-CM | POA: Insufficient documentation

## 2021-04-20 DIAGNOSIS — E11622 Type 2 diabetes mellitus with other skin ulcer: Secondary | ICD-10-CM | POA: Insufficient documentation

## 2021-04-20 DIAGNOSIS — L97321 Non-pressure chronic ulcer of left ankle limited to breakdown of skin: Secondary | ICD-10-CM | POA: Insufficient documentation

## 2021-04-20 DIAGNOSIS — L03116 Cellulitis of left lower limb: Secondary | ICD-10-CM | POA: Insufficient documentation

## 2021-04-20 DIAGNOSIS — L080 Pyoderma: Secondary | ICD-10-CM | POA: Diagnosis present

## 2021-04-20 DIAGNOSIS — I872 Venous insufficiency (chronic) (peripheral): Secondary | ICD-10-CM | POA: Insufficient documentation

## 2021-04-20 DIAGNOSIS — L97222 Non-pressure chronic ulcer of left calf with fat layer exposed: Secondary | ICD-10-CM | POA: Insufficient documentation

## 2021-04-20 DIAGNOSIS — L88 Pyoderma gangrenosum: Secondary | ICD-10-CM | POA: Diagnosis not present

## 2021-04-20 DIAGNOSIS — I10 Essential (primary) hypertension: Secondary | ICD-10-CM | POA: Insufficient documentation

## 2021-04-20 NOTE — Progress Notes (Addendum)
[name], [name] ([id number]) Visit Report for 04/20/2021 Chief Complaint Document Details Patient Name: [name], [name] Date of Service: 04/20/2021 2:45 PM Medical Record Number: [id number] Patient Account Number: [id number] Date of Birth/Sex: January 13, 1981 (40 y.o. 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 M) Treating RN: Carlene Coria Primary Care Provider: [name] Other Clinician: Referring Provider: [name] Treating Provider/Extender: [name] Cliche in Treatment: 228 Cellular or Tissue Based Product Type Wound #1 Left,Lateral Lower Leg Applied to: Performed By: Physician Tommie Sams., PA-C Cellular or Tissue Based Product Apligraf Type: Level of Consciousness (Pre- Awake and Alert procedure): Pre-procedure Verification/Time Out Yes - 15:30 Taken: Location: trunk / arms / legs Wound Size (sq cm): 10 Product Size (sq cm): 44 Waste Size (sq cm): 22 Waste Reason: SIZE OF WOUND Amount of Product Applied (sq cm): 22 Instrument Used: Blade, Forceps, Scissors Lot #: [id number] Order #: 1 Expiration Date: 05/01/2021 Fenestrated: Yes Instrument: Blade Reconstituted: Yes Solution Type: normal saline Solution Amount: 10 ml Lot #: [id number] Solution Expiration Date:  10/19/2022 Secured: Yes Secured With: Steri-Strips Dressing Applied: Yes Primary Dressing: adaptic Procedural Pain: 0 Post Procedural Pain: 0 Response to Treatment: Procedure was tolerated well Level of Consciousness (Post- Awake and Alert procedure): Post Procedure Diagnosis Same as Pre-procedure Electronic Signature(s) Signed: 04/20/2021 4:25:56 PM By: Carlene Coria RN Entered By: Carlene Coria on 04/20/2021 15:39:51 [name] ([id number]) -------------------------------------------------------------------------------- HPI Details Patient Name: [name] Date of Service: 04/20/2021 2:45 PM Medical Record Number: [id number] Patient Account Number: [id number] Date of Birth/Sex: 1981-03-03 (40 y.o. M) Treating RN: Donnamarie Poag Primary Care Provider: [name] Other Clinician: Referring Provider: [name] Treating Provider/Extender: [name] Cliche in Treatment: 228 History of Present Illness HPI Description: 12/04/16; 40 year old man who comes into the clinic today for review of a wound on the posterior left calf. He tells me that is been there for about a year. He is not a diabetic he does smoke half a pack per day. He was seen in the ER on 11/20/16 felt to have cellulitis around the wound and was given clindamycin. An x-ray did not show osteomyelitis. The patient initially tells me that he has a milk allergy that sets off a pruritic itching rash on his lower legs which she scratches incessantly and he thinks that's what may have set up the wound. He has been using various topical antibiotics and ointments without any effect. He works in a [employer] and is on his feet all day. He does not have a prior history of wounds however he does have the rash on both lower legs the right arm and the ventral aspect of his left arm. These are excoriations and clearly have had scratching however there are of macular looking areas on both legs including a substantial larger area on  the right leg. This does not have an underlying open area. There is no blistering. The patient tells me that 2 years ago in [place] in response to the rash on his legs he saw  a dermatologist who told him he had a condition which may be pyoderma gangrenosum although I may be putting words into his mouth. He seemed to recognize this. On further questioning he admits to a 5 year history of quiesced. ulcerative colitis. He is not in any treatment for this. He's had no recent travel 12/11/16; the patient arrives today with his wound and roughly the same condition we've been using silver alginate this is a deep punched out wound with some surrounding erythema but no tenderness. Biopsy I did did not show confirmed pyoderma gangrenosum suggested nonspecific inflammation and vasculitis but does not provide an actual description of what was seen by the pathologist. I'm really not able to understand this We have also received information from the patient's dermatologist in [place] notes from April 2016. This was a doctor Agarwal-antal. 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 He was prescribed topical steroid high potency under occlusion which helped but at this point the patient did not have a deep punched out wound. 12/18/16; the patient's wound is larger in terms of surface area however this surface looks better and there is less depth. The surrounding erythema also is better. The patient states that the wrap we put on came off 2 days ago when he has been using his compression stockings. He we are in the process of getting a dermatology consult. 12/26/16 on evaluation today patient's left lower extremity wound shows evidence of infection with surrounding erythema noted. He has been tolerating the dressing changes but states that he has noted more discomfort. There is a larger area of erythema surrounding the wound. No fevers, chills, nausea, or vomiting noted at this time. With that being said the  wound still does have slough covering the surface. He is not allergic to any medication that he is aware of at this point. In regard to his right lower extremity he had several regions that are erythematous and pruritic he wonders if there's anything we can do to help that. 01/02/17 I reviewed patient's wound culture which was obtained his visit last week. He was placed on doxycycline at that point. Unfortunately that does not appear to be an antibiotic that would likely help with the situation however the pseudomonas noted on culture is sensitive to Cipro. Also unfortunately patient's wound seems to have a large compared to last week's evaluation. Not severely so but there are definitely increased measurements in general. He is continuing to have discomfort as well he writes this to be a seven out of 10. In fact he would prefer me not to perform any debridement today due to the fact that he is having discomfort and considering he has an active infection on the little reluctant to do so anyway. No fevers, chills, nausea, or vomiting noted at this time. 01/08/17; patient seems dermatology on September 5. I suspect dermatology will want the slides from the biopsy I did sent to their pathologist. I'm not sure if there is a way we can expedite that. In any case the culture I did before I left on vacation 3 weeks ago showed Pseudomonas he was given 10 days of Cipro and per her description of her intake nurses is actually somewhat better this week although the wound is quite a bit bigger than I remember the last time I saw this. He still has 3 more days of Cipro 01/21/17; dermatology appointment tomorrow. He has completed the ciprofloxacin for Pseudomonas. Surface of the wound looks better however he is had some  deterioration in the lesions on his right leg. Meantime the left lateral leg wound we will continue with sample 01/29/17; patient had his dermatology appointment but I can't yet see that note. He is  completed his antibiotics. The wound is more superficial but considerably larger in circumferential area than when he came in. This is in his left lateral calf. He also has swollen erythematous areas with superficial wounds on the right leg and small papular areas on both arms. There apparently areas in her his upper thighs and buttocks I did not look at those. Dermatology biopsied the right leg. Hopefully will have their input next week. 02/05/17; patient went back to see his dermatologist who told him that he had a "scratching problem" as well as staph. He is now on a 30 day course of doxycycline and I believe she gave him triamcinolone cream to the right leg areas to help with the itching [not exactly sure but probably triamcinolone]. She apparently looked at the left lateral leg wound although this was not rebiopsied and I think felt to be ultimately part of the same pathogenesis. He is using sample border foam and changing nevus himself. He now has a new open area on the right posterior leg which was his biopsy site I don't have any of the dermatology notes 02/12/17; we put the patient in compression last week with SANTYL to the wound on the left leg and the biopsy. Edema is much better and the depth of the wound is now at level of skin. Area is still the same oBiopsy site on the right lateral leg we've also been using santyl with a border foam dressing and he is changing this himself. 02/19/17; Using silver alginate started last week to both the substantial left leg wound and the biopsy site on the right wound. He is tolerating compression well. Has a an appointment with his primary M.D. tomorrow wondering about diuretics although I'm wondering if the edema problem is actually lymphedema 02/26/17; the patient has been to see his primary doctor Dr. Jerrel Ivory at [facility] our primary care. She started him on Lasix 20 mg and this seems to have helped with the edema. However we are not making  substantial change with the left lateral calf wound and inflammation. The biopsy site on the right leg also looks stable but not really all that different. 03/12/17; the patient has been to see vein and vascular Dr. Lucky Cowboy. He has had venous reflux studies I have not reviewed these. I did get a call from his dermatology office. They felt that he might have pathergy based on their biopsy on his right leg which led them to look at the slides of SEICHI, KAUFHOLD ([id number]) the biopsy I did on the left leg and they wonder whether this represents pyoderma gangrenosum which was the original supposition in a man with ulcerative colitis albeit inactive for many years. They therefore recommended clobetasol and tetracycline i.e. aggressive treatment for possible pyoderma gangrenosum. 03/26/17; apparently the patient just had reflux studies not an appointment with Dr. dew. She arrives in clinic today having applied clobetasol for 2-3 weeks. He notes over the last 2-3 days excessive drainage having to change the dressing 3-4 times a day and also expanding erythema. He states the expanding erythema seems to come and go and was last this red was earlier in the month.he is on doxycycline 150 mg twice a day as an anti-inflammatory systemic therapy for possible pyoderma gangrenosum along with the topical clobetasol 04/02/17; the  patient was seen last week by Dr. Lillia Carmel at [facility] dermatology locally who kindly saw him at my request. 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 My concern was the degree of erythema medially extending from his left leg wound which was either inflammation from pyoderma or cellulitis. I put him on Augmentin however culture of the wound showed Pseudomonas which is quinolone sensitive. I really don't believe he has cellulitis however in view of everything I will continue and give him a course of Cipro. He is also on doxycycline as an  immune modulator for the pyoderma. In addition to his original wound on the left lateral leg with surrounding erythema he has a wound on the right posterior calf which was an original biopsy site done by dermatology. 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 Saw Dr. Lillia Carmel yesterday. He has been using topical antibiotics to both wound areas his original wound on the left and the biopsies/pathergy area on the right. There is definitely some improvement in the inflammation around the wound on the right although the patient states he has increasing sensitivity of the wounds. He is on prednisone 60 and doxycycline 1 as prescribed by Dr. Lillia Carmel. He is covering the topical antibiotic with gauze and putting this in his own compression stocks and changing this daily. He states that Dr. Lottie Rater did a culture of the left leg wound yesterday 05/07/17; pyoderma gangrenosum. The patient saw Dr. Lillia Carmel yesterday and has a follow-up with her in one month. He is still using topical antibiotics to both wounds although he can't recall exactly what type. He is still on prednisone 60 mg. Dr. Lillia Carmel stated that the doxycycline could stop if we were in agreement. 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 The right medial leg was induced by biopsy/pathergy. The area on the right is essentially healed. 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 The right medial leg has closed and remains closed. He is still on prednisone 60. oHe tells me he missed his last dermatology appointment with Dr. Lillia Carmel but will make another appointment. He reports that her blood sugar at a recent screen in [place] was high 200's. He was 180 today. 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 I think he is going to require still much longer prednisone perhaps another 3 months before attempting to taper. In the meantime his wound is a lot better. Smaller. He is cleaning this off daily and applying topical antibiotics. When he was last in the clinic I thought about changing to [facility] and actually put in a couple of calls to dermatology although probably not during their business hours. 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 He continues on oral prednisone. He has been using triple antibiotic ointment. There is surface debris and we will transition to [facility] and have him return in 2 weeks. He has lost 30 pounds since his last appointment with lifestyle modification. He may benefit from topical steroid cream for treatment this can be considered at a later date. 08/22/17 on evaluation today patient appears to actually be doing rather well in  regard to his left lateral lower extremity ulcer. He has actually been managed by Dr. Dellia Nims most recently. Patient is currently on oral steroids at this time. This seems to have been of benefit for him. Nonetheless his last visit was actually with Leah on 08/06/17. Currently he is not utilizing any topical steroid creams although this could be of benefit as well. No fevers, chills, nausea, or vomiting noted at this time. 09/05/17 on evaluation today patient appears to be doing better in regard to his left lateral lower extremity ulcer. He has been tolerating the dressing changes without complication. He is using Santyl with good effect. Overall I'm very pleased with how things are standing at this point. Patient likewise is happy that this is doing better. 09/19/17 on evaluation today patient actually appears to be doing rather well in regard to his left lateral lower extremity ulcer. Again this  is secondary to Pyoderma gangrenosum and he seems to be progressing well with the Santyl which is good news. He's not having any significant pain. 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 He has been tolerating the Santyl without complication and in general I feel like he's making good progress. 10/17/17 on evaluation today patient appears to be doing very well in regard to his left lateral lower surety ulcer. He has been tolerating the dressing changes without complication. There does not appear to be any evidence of infection he's alternating the Santyl and the triple antibiotic ointment every other day this seems to be doing well for him. 11/03/17 on evaluation today patient appears to be doing very well in regard to his left lateral lower extremity ulcer. He is been tolerating the dressing changes without complication which is good news. Fortunately there does not appear to be any evidence of infection which is also great news. Overall is doing excellent they are starting to taper down on the prednisone is down to 40 mg at this point it also started topical clobetasol for him. 11/17/17 on evaluation today patient appears to be doing well in regard to his left lateral lower surety ulcer. He's been tolerating the dressing changes without complication. He does note that he is having no pain, no excessive drainage or discharge, and overall he feels like things are going about how he would expect and hope they would. Overall he seems to have no evidence of infection at this time in my opinion which is good news. 12/04/17-He is seen in follow-up evaluation for right lateral lower extremity ulcer. He has been applying topical steroid cream. Today's measurement show slight increase in size. Over the next 2 weeks we will transition to every other day Santyl and steroid cream. He has been encouraged  to monitor for changes and notify clinic with any concerns 12/15/17 on evaluation today patient's left lateral motion the ulcer and fortunately is doing worse again at this point. This just since last week to this week has close to doubled in size according to the patient. I did not seeing last week's I do not have a visual to compare this to in our system was also down so we do not have all the charts and at this point. Nonetheless it does have me somewhat concerned in regard to the fact that again he was worried enough about it he has contact the dermatology that placed them back on the full strength, 50 mg a day of the  prednisone that he was taken previous. He continues to alternate using clobetasol along with Santyl at this point. He is obviously somewhat frustrated. 12/22/17 on evaluation today patient appears to be doing a little worse compared to last evaluation. Unfortunately the wound is a little deeper and slightly larger than the last week's evaluation. With that being said he has made some progress in regard to the irritation surrounding at this time unfortunately despite that progress that's been made he still has a significant issue going on here. 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 [name], [name] ([id number]) For that reason I discussed with him today the possibility of perform a wound culture to ensure there's nothing overtly infected. 01/06/18 on evaluation today patient's wound is larger and deeper than previously evaluated. With that being said it did appear that his wound was infected after my last evaluation with him. Subsequently I did end up prescribing a prescription for Bactrim DS which she has been taking and having no complication with. Fortunately there does not appear to be any evidence of infection at this point in time as  far as anything spreading, no want to touch, and overall I feel like things are showing signs of improvement. 01/13/18 on evaluation today patient appears to be even a little larger and deeper than last time. There still muscle exposed in the base of the wound. Nonetheless he does appear to be less erythematous I do believe inflammation is calming down also believe the infection looks like it's probably resolved at this time based on what I'm seeing. No fevers, chills, nausea, or vomiting noted at this time. 01/30/18 on evaluation today patient actually appears to visually look better for the most part. Unfortunately those visually this looks better he does seem to potentially have what may be an abscess in the muscle that has been noted in the central portion of the wound. This is the first time that I have noted what appears to be fluctuance in the central portion of the muscle. With that being said I'm somewhat more concerned about the fact that this might indicate an abscess formation at this location. I do believe that an ultrasound would be appropriate. This is likely something we need to try to do as soon as possible. He has been switch to mupirocin ointment and he is no longer using the steroid ointment as prescribed by dermatology he sees them again next week he's been decreased from 60 to 40 mg of prednisone. 03/09/18 on evaluation today patient actually appears to be doing a little better compared to last time I saw him. There's not as much erythema surrounding the wound itself. He I did review his most recent infectious disease note which was dated 02/24/18. He saw Dr. Michel Bickers in [place]. 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 Patient is now off of antibiotics and they are continually observing this. There seems to be no change in the past two weeks in my pinion based on what the patient says  and what I see today compared to what Dr. Megan Salon likely saw two weeks ago. No fevers, chills, nausea, or vomiting noted at this time. 03/23/18 on evaluation today patient's wound actually appears to be showing signs of improvement which is good news. He is  currently still on the Dapsone. He is also working on tapering the prednisone to get off of this and Dr. Lottie Rater is working with him in this regard. Nonetheless overall I feel like the wound is doing well it does appear based on the infectious disease note that I reviewed from Dr. 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 I did also review the results of his ultrasound of left lower extremity which revealed there was a dentist tissue in the base of the wound without an abscess noted. 04/06/18 on evaluation today the patient's left lateral lower extremity ulcer actually appears to be doing fairly well which is excellent news. There does not appear to be any evidence of infection at this time which is also great news. Overall he still does have a significantly large ulceration although little by little he seems to be making progress. He is down to 10 mg a day of the prednisone. 04/20/18 on evaluation today patient actually appears to be doing excellent at this time in regard to his left lower extremity ulcer. He's making signs of good progress unfortunately this is taking much longer than we would really like to see but nonetheless he is making progress. Fortunately there does not appear to be any evidence of infection at this time. No fevers, chills, nausea, or vomiting noted at this time. The patient has not been using the Santyl due to the cost he hadn't got in this field yet. He's mainly been using the antibiotic ointment topically. Subsequently he also tells me that he really has not been scrubbing in the shower I think this would  be helpful again as I told him it doesn't have to be anything too aggressive to even make it believe just enough to keep it free of some of the loose slough and biofilm on the wound surface. 05/11/18 on evaluation today patient's wound appears to be making slow but sure progress in regard to the left lateral lower extremity ulcer. He is been tolerating the dressing changes without complication. Fortunately there does not appear to be any evidence of infection at this time. He is still just using triple antibiotic ointment along with clobetasol occasionally over the area. He never got the Santyl and really does not seem to intend to in my pinion. 06/01/18 on evaluation today patient appears to be doing a little better in regard to his left lateral lower extremity ulcer. He states that overall he does not feel like he is doing as well with the Dapsone as he did with the prednisone. Nonetheless he sees his dermatologist later today and is gonna talk to them about the possibility of going back on the prednisone. Overall again I believe that the wound would be better if you would utilize Santyl but he really does not seem to be interested in going back to the [place] at this point. He has been using triple antibiotic ointment. 06/15/18 on evaluation today patient's wound actually appears to be doing about the same at this point. Fortunately there is no signs of infection at this time. He has made slight improvements although he continues to not really want to clean the wound bed at this point. He states that he just doesn't mess with it he doesn't want to cause any problems with everything else he has going on. He has been on medication, antibiotics as prescribed by his dermatologist,  for a staff infection of his lower extremities which is really drying out now and looking much better he tells me. Fortunately there is no sign of overall infection. 06/29/18 on evaluation today patient appears to be doing well  in regard to his left lateral lower surety ulcer all things considering. Fortunately his staff infection seems to be greatly improved compared to previous. He has no signs of infection and this is drying up quite nicely. He is still the doxycycline for this is no longer on cental, Dapsone, or any of the other medications. His dermatologist has recommended possibility of an infusion but right now he does not want to proceed with that. 07/13/18 on evaluation today patient appears to be doing about the same in regard to his left lateral lower surety ulcer. Fortunately there's no signs of infection at this time which is great news. Unfortunately he still builds up a significant amount of Slough/biofilm of the surface of the wound he still is not really cleaning this as he should be appropriately. Again I'm able to easily with saline and gauze remove the majority of this on the surface which if you would do this at home would likely be a dramatic improvement for him as far as getting the area to improve. Nonetheless overall I still feel like he is making progress is just very slow. I think Santyl will be of benefit for him as well. Still he has not gotten this as of this point. 07/27/18 on evaluation today patient actually appears to be doing little worse in regards of the erythema around the periwound region of the wound he also tells me that he's been having more drainage currently compared to what he was experiencing last time I saw him. He states not quite as bad as what he had because this was infected previously but nonetheless is still appears to be doing poorly. Fortunately there is no evidence of systemic infection at this point. The patient tells me that he is not going to be able to afford the Santyl. He is still waiting to hear about the infusion therapy with his dermatologist. Apparently she wants an updated colonoscopy first. 08/10/18 on evaluation today patient appears to be doing better in  regard to his left lateral lower extremity ulcer. Fortunately he is showing signs of improvement in this regard he's actually been approved for Remicade infusion's as well although this has not been scheduled as of yet. Fortunately there's no signs of active infection at this time in regard to the wound although he is having some issues with infection of the right lower extremity is been seen as dermatologist for this. Fortunately they are definitely still working with him trying to keep things under control. [name], [name] ([id number]) 09/07/18 on evaluation today patient is actually doing rather well in regard to his left lateral lower extremity ulcer. He notes these actually having some hair grow back on his extremity which is something he has not seen in years. He also tells me that the pain is really not giving them any trouble at this time which is also good news overall she is very pleased with the progress he's using a combination of the mupirocin along with the probate is all mixed. 09/21/18 on evaluation today patient actually appears to be doing fairly well all things considered in regard to his looks from the ulcer. He's been tolerating the dressing changes without complication. Fortunately there's no signs of active infection at this time which is good news he  is still on all antibiotics or prevention of the staff infection. He has been on prednisone for time although he states it is gonna contact his dermatologist and see if she put them on a short course due to some irritation that he has going on currently. Fortunately there's no evidence of any overall worsening this is going very slow I think cental would be something that would be helpful for him although he states that $50 for tube is quite expensive. He therefore is not willing to get that at this point. 10/06/18 on evaluation today patient actually appears to be doing decently well in regard to his left lateral leg ulcer. He's been  tolerating the dressing changes without complication. Fortunately there's no signs of active infection at this time. Overall I'm actually rather pleased with the progress he's making although it's slow he doesn't show any signs of infection and he does seem to be making some improvement. I do believe that he may need a switch up and dressings to try to help this to heal more appropriately and quickly. 10/19/18 on evaluation today patient actually appears to be doing better in regard to his left lateral lower extremity ulcer. This is shown signs of having much less Slough buildup at this point due to the fact he has been using the [employer]. Obviously this is very good news. The overall size of the wound is not dramatically smaller but again the appearance is. 11/02/18 on evaluation today patient actually appears to be doing quite well in regard to his lower [place] ulcer. A lot of the skin around the ulcer is actually somewhat irritating at this point this seems to be more due to the dressing causing irritation from the adhesive that anything else. Fortunately there is no signs of active infection at this time. 11/24/18 on evaluation today patient appears to be doing a little worse in regard to his overall appearance of his lower extremity ulcer. There's more erythema and warmth around the wound unfortunately. He is currently on doxycycline which he has been on for some time. With that being said I'm not sure that seems to be helping with what appears to possibly be an acute cellulitis with regard to his left lower extremity ulcer. No fevers, chills, nausea, or vomiting noted at this time. 12/08/18 on evaluation today patient's wounds actually appears to be doing significantly better compared to his last evaluation. He has been using Santyl along with alternating tripling about appointment as well as the steroid cream seems to be doing quite well and the wound is showing signs of improvement which is  excellent news. Fortunately there's no evidence of infection and in fact his culture came back negative with only normal skin flora noted. 12/21/2018 upon evaluation today patient actually appears to be doing excellent with regard to his ulcer. This is actually the best that I have seen it since have been helping to take care of him. It is both smaller as well as less slough noted on the surface of the wound and seems to be showing signs of good improvement with new skin growing from the edges. He has been using just the triamcinolone he does wonder if he can get a refill of that ointment today. 01/04/2019 upon evaluation today patient actually appears to be doing well with regard to his left lateral lower extremity ulcer. With that being said it does not appear to be that he is doing quite as well as last time as far as progression is  concerned. There does not appear to be any signs of infection or significant irritation which is good news. With that being said I do believe that he may benefit from switching to a collagen based dressing based on how clean The wound appears. 01/18/2019 on evaluation today patient actually appears to be doing well with regard to his wound on the left lower extremity. He is not made a lot of progress compared to where we were previous but nonetheless does seem to be doing okay at this time which is good news. There is no signs of active infection which is also good news. My only concern currently is I do wish we can get him into utilizing the collagen dressing his insurance would not pay for the supplies that we ordered although it appears that he may be able to order this through his supply company that he typically utilizes. This is [employer]. Nonetheless he did try to order it during the office visit today and it appears this did go through. We will see if he can get that it is a different brand but nonetheless he has collagen and I do think will be beneficial. 02/01/2019  on evaluation today patient actually appears to be doing a little worse today in regard to the overall size of his wounds. Fortunately there is no signs of active infection at this time. That is visually. Nonetheless when this is happened before it was due to infection. For that reason were somewhat concerned about that this time as well. 02/08/2019 on evaluation today patient unfortunately appears to be doing slightly worse with regard to his wound upon evaluation today. Is measuring a little deeper and a little larger unfortunately. I am not really sure exactly what is causing this to enlarge he actually did see his dermatologist she is going to see about initiating Humira for him. Subsequently she also did do steroid injections into the wound itself in the periphery. Nonetheless still nonetheless he seems to be getting a little bit larger he is gone back to just using the steroid cream topically which I think is appropriate. I would say hold off on the collagen for the time being is definitely a good thing to do. Based on the culture results which we finally did get the final result back regarding it shows staph as the bacteria noted again that can be a normal skin bacteria based on the fact however he is having increased drainage and worsening of the wound measurement wise I would go ahead and place him on an antibiotic today I do believe for this. 02/15/2019 on evaluation today patient actually appears to be doing somewhat better in regard to his ulcer. There is no signs of worsening at this time I did review his culture results which showed evidence of Staphylococcus aureus but not MRSA. Again this could just be more related to the normal skin bacteria although he states the drainage has slowed down quite a bit he may have had a mild infection not just colonization. And was much smaller and then since around10/04/2019 on evaluation today patient appears to be doing unfortunately worse as far as the  size of the wound. I really feel like that this is steadily getting larger again it had been doing excellent right at the beginning of September we have seen a steady increase in the area of the wound it is almost 2-1/2 times the size it was on September 1. Obviously this is a bad trend this is not wanting to  see. For that reason we went back to using just the topical triamcinolone cream which does seem to help with inflammation. I checked him for bacteria by way of culture and nothing showed positive there. I am considering giving him a short course of a tapering steroid Dosepak today to see if that is can be beneficial for him. The patient is in agreement with giving that a try. 03/08/2019 on evaluation today patient appears to be doing very well in comparison to last evaluation with regard to his lower extremity ulcer. This is showing signs of less inflammation and actually measuring slightly smaller compared to last time every other week over the past month and a half he has been measuring larger larger larger. Nonetheless I do believe that the issue has been inflammation the prednisone does seem to [facility], [place] ([id number]) have been beneficial for him which is good news. No fevers, chills, nausea, vomiting, or diarrhea. 03/22/2019 on evaluation today patient appears to be doing about the same with regard to his leg ulcer. He has been tolerating the dressing changes without complication. With that being said the wound seems to be mostly arrested at its current size but really is not making any progress except for when we prescribed the prednisone. He did show some signs of dropping as far as the overall size of the wound during that interval week. Nonetheless this is something he is not on long-term at this point and unfortunately I think he is getting need either this or else the Humira which his dermatologist has discussed try to get approval for. With that being said he will be seeing his  dermatologist on the 11th of this month that is November. 04/19/2019 on evaluation today patient appears to be doing really about the same the wound is measuring slightly larger compared to last time I saw him. He has not been into the office since November 2 due to the fact that he unfortunately had Covid as that his entire family. He tells me that it was rough but they did pull-through and he seems to be doing much better. Fortunately there is no signs of active infection at this time. No fevers, chills, nausea, vomiting, or diarrhea. 05/10/2019 on evaluation today patient unfortunately appears to be doing significantly worse as compared to last time I saw him. He does tell me that he has had his first dose of Humira and actually is scheduled to get the next one in the upcoming week. With that being said he tells me also that in the past several days he has been having a lot of issues with green drainage she showed me a picture this is more blue-green in color. He is also been having issues with increased sloughy buildup and the wound does appear to be larger today. Obviously this is not the direction that we want everything to take based on the starting of his Humira. Nonetheless I think this is definitely a result of likely infection and to be honest I think this is probably Pseudomonas causing the infection based on what I am seeing. 05/24/2019 on evaluation today patient unfortunately appears to be doing significantly worse compared to his prior evaluation with me 2 weeks ago. I did review his culture results which showed that he does have Staph aureus as well as Pseudomonas noted on the culture. Nonetheless the Levaquin that I prescribed for him does not appear to have been appropriate and in fact he tells me he is no longer experiencing the green  drainage and discharge that he had at the last visit. Fortunately there is no signs of active infection at this time which is good news although  the wound has significantly worsened it in fact is much deeper than it was previous. We have been utilizing up to this point triamcinolone ointment as the prescription topical of choice but at this time I really feel like that the wound is getting need to be packed in order to appropriately manage this due to the deeper nature of the wound. Therefore something along the lines of an alginate dressing may be more appropriate. 05/31/2019 upon inspection today patient's wound actually showed signs of doing poorly at this point. Unfortunately he just does not seem to be making any good progress despite what we have tried. He actually did go ahead and pick up the Cipro and start taking that as he was noticing more green drainage he had previously completed the Levaquin that I prescribed for him as well. Nonetheless he missed his appointment for the seventh last week on Wednesday with the wound care center and [facility] where his dermatologist referred him. Obviously I do think a second opinion would be helpful at this point especially in light of the fact that the patient seems to be doing so poorly despite the fact that we have tried everything that I really know how at this point. The only thing that ever seems to have helped him in the past is when he was on high doses of continual steroids that did seem to make a difference for him. 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 06/07/2019 upon evaluation today patient unfortunately appears to be doing worse yet again with regard to his wound. In fact I am starting to question whether or not he may have a fluid pocket in the muscle at this point based on the bulging and the soft appearance to the central portion of the muscle area. There is not anything draining from the muscle itself at this time  which is good news but nonetheless the wound is expanding. I am not really seeing any results of the Humira as far as overall wound progression based on what I am seeing at this point. The patient has been referred for second opinion with regard to his wound to the [facility] wound care center by his dermatologist which I definitely am not in opposition to. Unfortunately we tried multiple dressings in the past including collagen, alginate, and at one point even Hydrofera Blue. With that being said he is never really used it for any significant amount of time due to the fact that he often complains of pain associated with these dressings and then will go back to either using the Santyl which she has done intermittently or more frequently the triamcinolone. He is also using his own compression stockings. We have wrapped him in the past but again that was something else that he really was not a big fan of. 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 He seemed to do much better when he was on the steroids although honestly I understand that the steroids are not necessarily the best medication to be on long-term obviously 06/14/2019 on evaluation today patient appears to be doing actually a little bit better with regard to the overall appearance with his leg. Unfortunately he does continue to have issues with what appears to be some fluid underneath the muscle although he did see the wound specialty center at [facility] last week their main goals were to see about infusion therapy in place of the Humira as they feel like that is not quite strong enough. They also recommended that we continue with the  treatment otherwise as we are they felt like that was appropriate and they are okay with him continuing to follow-up here with [place] in that regard. With that being said they are also sending him to the vein specialist there to see about vein stripping and if that would be of benefit for him. Subsequently they also did not really address whether or not an ultrasound of the muscle area to see if there is anything that needs to be addressed here would be appropriate or not. For that reason I discussed this with him last week I think we may proceed down that road at this point. 06/21/2019 upon evaluation today patient's wound actually appears to be doing slightly better compared to previous evaluations. I do believe that he has made a difference with regard to the progression here with the use of oral steroids. Again in the past has been the only thing that is really calm things down. He does tell me that from [facility] is gotten a good news from there that there are no further vein stripping that is necessary at this point. I do not have that available for review today although the patient did relay this to me. He also did obtain and have the ultrasound of the wound completed which I did sign off on today. It does appear that there is no fluid collection under the muscle this is likely then just edematous tissue in general. That is also good news. Overall I still believe the inflammation is the main issue here. He did inquire about the possibility of a wound VAC again with the muscle protruding like it is I am not really sure whether the wound VAC is necessarily ideal or not. That is something we will have to consider although I do believe he may need compression wrapping to try to help with edema control which could potentially be of benefit. 06/28/2019 on evaluation today patient appears to be doing slightly better measurement wise although this is not terribly smaller he least seems to be trending towards that  direction. With that being said he still seems to have purulent drainage noted in the wound bed at this time. He has been on Levaquin followed by Cipro over the past month. Unfortunately he still seems to have some issues with active infection at this time. I did perform a culture last week in order to evaluate and see if indeed there was still anything going on. Subsequently the culture did come back showing Pseudomonas which is consistent with the drainage has been having which is blue-green in color. He also has had an odor that again was somewhat consistent with Pseudomonas as well. Long story short it appears that the culture showed an intermediate finding with regard to how well the Cipro will  work for the Pseudomonas infection. Subsequently being that he does not seem to be clearing up and at best what we are doing is just keeping this at [place] I think he may need to see infectious disease to discuss IV antibiotic options. [name], [name] ([id number]) 07/05/2019 upon evaluation today patient appears to be doing okay in regard to his leg ulcer. He has been tolerating the dressing changes at this point without complication. Fortunately there is no signs of active infection at this time which is good news. No fevers, chills, nausea, vomiting, or diarrhea. With that being said he does have an appointment with infectious disease tomorrow and his primary care on Wednesday. Again the reason for the infectious disease referral was due to the fact that he did not seem to be fully resolving with the use of oral antibiotics and therefore we were thinking that IV antibiotic therapy may be necessary secondary to the fact that there was an intermediate finding for how effective the Cipro may be. Nonetheless again he has been having a lot of purulent and even green drainage. Fortunately right now that seems to have calmed down over the past week with the reinitiation of the oral antibiotic. Nonetheless we will see  what Dr. Megan Salon has to say. 07/12/2019 upon evaluation today patient appears to be doing about the same at this point in regard to his left lower extremity ulcer. Fortunately there is no signs of active infection at this time which is good news I do believe the Levaquin has been beneficial I did review Dr. Hale Bogus note and to be honest I agree that the patient's leg does appear to be doing better currently. What we found in the past as he does not seem to really completely resolve he will stop the antibiotic and then subsequently things will revert back to having issues with blue-green drainage, increased pain, and overall worsening in general. Obviously that is the reason I sent him back to infectious disease. 07/19/2019 upon evaluation today patient appears to be doing roughly the same in size there is really no dramatic improvement. He has started back on the Levaquin at this point and though he seems to be doing okay he did still have a lot of blue/green drainage noted on evaluation today unfortunately. I think that this is still indicative more likely of a Pseudomonas infection as previously noted and again he does see Dr. Megan Salon in just a couple of days. I do not know that were really able to effectively clear this with just oral antibiotics alone based on what I am seeing currently. Nonetheless we are still continue to try to manage as best we can with regard to the patient and his wound. I do think the wrap was helpful in decreasing the edema which is excellent news. No fevers, chills, nausea, vomiting, or diarrhea. 07/26/2019 upon evaluation today patient appears to be doing slightly better with regard to the overall appearance of the muscle there is no dark discoloration centrally. Fortunately there is no signs of active infection at this time. No fevers, chills, nausea, vomiting, or diarrhea. Patient's wound bed currently the patient did have an appointment with Dr. Megan Salon at infectious  disease last week. With that being said Dr. Megan Salon the patient states was still somewhat hesitant about put him on any IV antibiotics he wanted [place] to repeat cultures today and then see where things go going forward. He does look like Dr. Megan Salon because of some improvement the patient did have with the  Levaquin wanted [place] to see about repeating cultures. If it indeed grows the Pseudomonas again then he recommended a possibility of considering a PICC line placement and IV antibiotic therapy. He plans to see the patient back in 1 to 2 weeks. 08/02/2019 upon evaluation today patient appears to be doing poorly with regard to his left lower extremity. We did get the results of his culture back it shows that he is still showing evidence of Pseudomonas which is consistent with the purulent/blue-green drainage that he has currently. Subsequently the culture also shows that he now is showing resistance to the oral fluoroquinolones which is unfortunate as that was really the only thing to treat the infection prior. I do believe that he is looking like this is going require IV antibiotic therapy to get this under control. Fortunately there is no signs of systemic infection at this time which is good news. The patient does see Dr. Megan Salon tomorrow. 08/09/2019 upon evaluation today patient appears to be doing better with regard to his left lower extremity ulcer in regard to the overall appearance. He is currently on IV antibiotic therapy. As ordered by Dr. Megan Salon. Currently the patient is on ceftazidime which she is going to take for the next 2 weeks and then follow-up for 4 to 5-week appointment with Dr. Megan Salon. The patient started this this past Friday symptoms have not for a total of 3 days currently in full. 08/16/2019 upon evaluation today patient's wound actually does show muscle in the base of the wound but in general does appear to be much better as far as the overall evidence of infection is  concerned. In fact I feel like this is for the most part cleared up he still on the IV antibiotics he has not completed the full course yet but I think he is doing much better which is excellent news. 08/23/2019 upon evaluation today patient appears to be doing about the same with regard to his wound at this point. He tells me that he still has pain unfortunately. Fortunately there is no evidence of systemic infection at this time which is great news. There is significant muscle protrusion. 09/13/19 upon evaluation today patient appears to be doing about the same in regard to his leg unfortunately. He still has a lot of drainage coming from the ulceration there is still muscle exposed. With that being said the patient's last wound culture still showed an intermediate finding with regard to the Pseudomonas he still having the bluish/green drainage as well. Overall I do not know that the wound has completely cleared of infection at this point. Fortunately there is no signs of active infection systemically at this point which is good news. 09/20/2019 upon evaluation today patient's wound actually appears to be doing about the same based on what I am seeing currently. I do not see any signs of systemic infection he still does have evidence of some local infection and drainage. He did see Dr. Megan Salon last week and Dr. Megan Salon states that he probably does need a different IV antibiotic although he does not want to put him on this until the patient begins the Remicade infusion which is actually scheduled for about 10 days out from today on 13 May. Following that time Dr. Megan Salon is good to see him back and then will evaluate the feasibility of starting him on the IV antibiotic therapy once again at that point. I do not disagree with this plan I do believe as Dr. Megan Salon stated in his note  that I reviewed today that the patient's issue is multifactorial with the pyoderma being 1 aspect of this that were hoping  the Remicade will be helpful for her. In the meantime I think the gentamicin is, helping to keep things under decent okay control in regard to the ulcer. 09/27/2019 upon evaluation today patient appears to be doing about the same with regard to his wound still there is a lot of muscle exposure though he does have some hyper granulation tissue noted around the edge and actually some granulation tissue starting to form over the muscle which is actually good news. Fortunately there is no evidence of active infection which is also good news. His pain is less at this point. 5/21; this is a patient I have not seen in a long time. 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 He has a large wound on the left lateral leg with protruding muscle. He comes in the clinic today showing the same area on his left medial ankle. He says there is been a spot there for some time although we have not previously defined this. Today he has a clearly defined area with slight amount of skin breakdown surrounded by raised areas with a purplish hue in color. This is not painful he says it is irritated. 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 He still has muscle protruding from the lateral portion of his left leg but fortunately the new area that they were concerned about at his last visit does not appear to have opened at this point. He is currently on Remicade infusions and seems to be doing better in my opinion in fact the wound itself seems to be overall much better. The purplish discoloration that he did have seems to have resolved and I think that is a good sign that hopefully the Remicade is doing its job. He does have some biofilm noted over the surface of the wound. 11/01/2019 on evaluation today patient's wound actually appears to be doing excellent at this time. Fortunately  there is no evidence of active infection and overall I feel like he is making great progress. The Remicade seems to be due excellent job in my opinion. JAQUARI, RECKNER ([id number]) 11/08/19 evaluation today vision actually appears to be doing quite well with regard to his weight ulcer. He's been tolerating dressing changes without complication. Fortunately there is no evidence of infection. No fevers, chills, nausea, or vomiting noted at this time. Overall states that is having more itching than pain which is actually a good sign in my opinion. 12/13/2019 upon evaluation today patient appears to be doing well today with regard to his wound. He has been tolerating the dressing changes without complication. Fortunately there is no sign of active infection at this time. No fevers, chills, nausea, vomiting, or diarrhea. Overall I feel like the infusion therapy has been very beneficial for him. 01/06/2020 on evaluation today patient appears to be doing well with regard to his wound. This is measuring smaller and actually looks to be doing better. Fortunately there is no signs of active infection at this point. No fevers, chills, nausea, vomiting, or diarrhea. With that being said he does still have the blue-green drainage but this does not seem to be causing any significant issues currently. He has been using the gentamicin that does seem to be keeping things under decent control at this point. He goes later  this morning for his next infusion therapy for the pyoderma which seems to also be very beneficial. 02/07/2020 on evaluation today patient appears to be doing about the same in regard to his wounds currently. Fortunately there is no signs of active infection systemically he does still have evidence of local infection still using gentamicin. He also is showing some signs of improvement albeit slowly I do feel like we are making some progress here. 02/21/2020 upon evaluation today patient appears to be making  some signs of improvement the wound is measuring a little bit smaller which is great news and overall I am very pleased with where he stands currently. He is going to be having infusion therapy treatment on the 15th of this month. Fortunately there is no signs of active infection at this time. 03/13/2020 I do believe patient's wound is actually showing some signs of improvement here which is great news. He has continue with the infusion therapy through rheumatology/dermatology at [facility]. That does seem to be beneficial. I still think he gets as much benefit from this as he did from the prednisone initially but nonetheless obviously this is less harsh on his body that the prednisone as far as they are concerned. 03/31/2020 on evaluation today patient's wound actually showing signs of some pretty good improvement in regard to the overall appearance of the wound bed. There is still muscle exposed though he does have some epithelial growth around the edges of the wound. Fortunately there is no signs of active infection at this time. No fevers, chills, nausea, vomiting, or diarrhea. 04/24/2020 upon evaluation today patient appears to be doing about the same in regard to his leg ulcer. He has been tolerating the dressing changes without complication. Fortunately there is no signs of active infection at this time. No fevers, chills, nausea, vomiting, or diarrhea. With that being said he still has a lot of irritation from the bandaging around the edges of the wound. We did discuss today the possibility of a referral to plastic surgery. 05/22/2020 on evaluation today patient appears to be doing well with regard to his wounds all things considered. He has not been able to get the Chantix apparently there is a recall nurse that I was unaware of put out by [employer] involuntarily. Nonetheless for now I am and I have to do some research into what may be the best option for him to help with quitting in regard to smoking and  we discussed that today. 06/26/2020 upon evaluation today patient appears to be doing well with regard to his wound from the standpoint of infection I do not see any signs of infection at this point. With that being said unfortunately he is still continuing to have issues with muscle exposure and again he is not having a whole lot of new skin growth unfortunately. There does not appear to be any signs of active infection at this time. No fevers, chills, nausea, vomiting, or diarrhea. 07/10/2020 upon evaluation today patient appears to be doing a little bit more poorly currently compared to where he was previous. I am concerned currently about an active infection that may be getting worse especially in light of the increased size and tenderness of the wound bed. No fevers, chills, nausea, vomiting, or diarrhea. 07/24/2020 upon evaluation today patient appears to be doing poorly in regard to his leg ulcer. He has been tolerating the dressing changes without complication but unfortunately is having a lot of discomfort. Unfortunately the patient has an infection with Pseudomonas  resistant to gentamicin as well as fluoroquinolones. Subsequently I think he is going require possibly IV antibiotics to get this under control. I am very concerned about the severity of his infection and the amount of discomfort he is having. 07/31/2020 upon evaluation today patient appears to be doing about the same in regard to his leg wound. He did see Dr. Megan Salon and Dr. Megan Salon is actually going to start him on IV antibiotics. He goes for the PICC line tomorrow. With that being said there do not have that run for 2 weeks and then see how things are doing and depending on how he is progressing they may extend that a little longer. Nonetheless I am glad this is getting ready to be in place and definitely feel it may help the patient. In the meantime is been using mainly triamcinolone to the wound bed has an  anti-inflammatory. 08/07/2020 on evaluation today patient appears to be doing well with regard to his wound compared even last week. In the interim he has gotten the PICC line placed and overall this seems to be doing excellent. There does not appear to be any evidence of infection which is great news systemically although locally of course has had the infection this appears to be improving with the use of the antibiotics. 08/14/2020 upon evaluation today patient's wound actually showing signs of excellent improvement. Overall the irritation has significantly improved the drainage is back down to more of a normal level and his pain is really pretty much nonexistent compared to what it was. Obviously I think that this is significantly improved secondary to the IV antibiotic therapy which has made all the difference in the world. Again he had a resistant form of Pseudomonas for which oral antibiotics just was not cutting it. Nonetheless I do think that still we need to consider the possibility of a surgical closure for this wound is been open so long and to be honest with muscle exposed I think this can be very hard to get this to close outside of this although definitely were still working to try to do what we can in that regard. 08/21/2020 upon evaluation today patient appears to be doing very well with regard to his wounds on the left lateral lower extremity/calf area. Fortunately there does not appear to be signs of active infection which is great news and overall very pleased with where things stand today. He is actually wrapping up his treatment with IV antibiotics tomorrow. After that we will see where things go from there. 08/28/2020 upon evaluation today patient appears to be doing decently well with regard to his leg ulcer. There does not appear to be any signs of active infection which is great news and overall very pleased with where things stand today. No fevers, chills, nausea, vomiting, or  diarrhea. 09/18/2020 upon evaluation today patient appears to be doing well with regard to his infection which I feel like is better. Unfortunately he is not doing as well with regard to the overall size of the wound which is not nearly as good at this point. I feel like that he may be having an issue here with the pyoderma being somewhat out of control. I think that he may benefit from potentially going back and talking to the dermatologist about [name], [name] ([id number]) what to do from the pyoderma standpoint. I am not certain if the infusions are helping nearly as much is what the prednisone did in the past. 10/02/2020 upon evaluation today patient appears  to be doing well with regard to his leg ulcer. He did go to the [facility] nurse. Unfortunately they feel like there is a 10% chance that most that he would be able to heal and that the skin graft would take. Obviously this has led him to not be able to go down that path as far as treatment is concerned. Nonetheless he does seem to be doing a little bit better with the prednisone that I gave him last time. I think that he may need to discuss with dermatology the possibility of long-term prednisone as that seems to be what is most helpful for him to be perfectly honest. I am not sure the Remicade is really doing the job. 10/17/2020 upon evaluation today patient appears to be doing a little better in regard to his wound. In fact the case has been since we did the prednisone on May 2 for him that we have noticed a little bit of improvement each time we have seen a size wise as well as appearance wise as well as pain wise. I think the prednisone has had a greater effect then the infusion therapy has to be perfectly honest. With that being said the patient also feels significantly better compared to what he was previous. All of this is good news but nonetheless I am still concerned about the fact that again we are really not set up to long-term manage him  as far as prednisone is concerned. Obviously there are things that you need to be watched I completely understand the risk of prednisone usage as well. 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 With all that being said I do believe that we can give him another round of the prednisone which she is requesting today because of the improvement that he seen since we did that first round. 10/30/2020 upon evaluation today patient's wound actually is showing signs of doing quite well. There does not appear to be any evidence of infection which is great news and overall very pleased with where things stand today. No fevers, chills, nausea, vomiting, or diarrhea. He tells me that the prednisone still has seem to have helped he wonders if we can extend that for just a little bit longer. He did not have the appointment with a dermatologist although he did have an infusion appointment last Friday. That was at [facility]. With that being said he tells me he could not do both that as well as the appointment with the physician on the same day therefore that is can have to be rescheduled. I really want to see if there is anything they feel like that could be done differently to try to help this out as I am not really certain that the infusions are helping significantly here. 11/13/2020 upon evaluation today patient unfortunately appears to be doing somewhat poorly in regard to his wound I feel like this is actually worsening from the standpoint of the pyoderma spreading. I still feel like that he may need something different as far as trying to manage this going forward. Again we did the prednisone unfortunately his blood sugars are not doing so well because of this. Nonetheless I believe that the patient likely needs to try topical steroid. We have done triamcinolone for a while I think going with something stronger such as clobetasol could be beneficial again this is not  something I do lightly I discussed this with the patient that again this  does not normally put underneath an occlusive dressing. Nonetheless I think a thin film as such could help with some of the stronger anti-inflammatory effects. We discussed this today. He would like to try to give this a trial for the next couple weeks. I definitely think that is something that we can do. Evaluate7/03/2021 and today patient's wound bed actually showed signs of doing really about the same. There was a little expansion of the size of the wound and that leading edge that we done looking out although the clobetasol does seem to have slowed this down a bit in my opinion. There is just 1 small area that still seems to be progressing based on what I see. Nonetheless I am concerned about the fact this does not seem to be improving if anything seems to be doing a little bit worse. I do not know that the infusions are really helping him much as next infusion is August 5 his appointment with dermatology is July 25. Either way I really think that we need to have a conversation potentially about this and I am actually going to see if I can talk with Dr. Lillia Carmel in order to see where things stand as well. 12/11/2020 upon evaluation today patient appears to be doing worse in regard to his leg ulcer. Unfortunately I just do not think this is making the progress that I would like to see at this point. Honestly he does have an appointment with dermatology and this is in 2 days. I am wondering what they may have to offer to help with this. Right now what I am seeing is that he is continuing to show signs of worsening little by little. Obviously that is not great at all. Is the exact opposite of what we are looking for. 12/18/2020 upon evaluation today patient appears to be doing a little better in regard to his wound. The dermatologist actually did do some steroid injections into the wound which does seem to have been beneficial in  my opinion. That was on the 25th already this looks a little better to me than last time I saw him. With that being said we did do a culture and this did show that he has Staph aureus noted in abundance in the wound. With that being said I do think that getting him on an oral antibiotic would be appropriate as well. Also think we can compression wrap and this will make a difference as well. 12/28/2020 upon evaluation today patient's wound is actually showing signs of doing much better. I do believe the compression wrap is helping he has a lot of drainage but to be honest I think that the compression is helping to some degree in this regard as well as not draining through which is also good news. No fevers, chills, nausea, vomiting, or diarrhea. 01/04/2021 upon evaluation today patient appears to be doing well with regard to his wound. Overall things seem to be doing quite well. He did have a little bit of reaction to the CarboFlex Sorbact he will be using that any longer. With that being said he is controlled as far as the drainage is concerned overall and seems to be doing quite well. I do not see any signs of active infection at this time which is great news. No fevers, chills, nausea, vomiting, or diarrhea. 01/11/2021 upon evaluation today patient appears to be doing well with regard to his wounds. He has been tolerating the dressing changes without complication. Fortunately there does not  appear to be any signs of active infection at this time which is great news. Overall I am extremely pleased with where we stand currently. No fevers, chills, nausea, vomiting, or diarrhea. Where using clobetasol in the wound bed he has a lot of new skin growth which is awesome as well. 01/18/2021 upon evaluation today patient appears to be doing very well in regard to his leg ulcer. He has been tolerating the dressing changes without complication. Fortunately there does not appear to be any signs of active infection  which is great news. In general I think that he is making excellent progress 01/25/2021 upon evaluation today patient appears to be doing well with regard to his wound on the leg. I am actually extremely pleased with where things stand today. There does not appear to be any signs of active infection which is great news and overall I think that we are definitely headed in the appropriate direction based on what I am seeing currently. There does not appear to be any signs of active infection also excellent news. 02/06/2021 upon evaluation today patient appears to be doing well with regard to his wound. Overall visually this is showing signs of significant improvement which is great news. I do not see any signs of active infection systemically which is great even locally I do not think that we are seeing any major complications here. We did do fluorescence imaging with the [employer] today. The patient does have some odor and drainage noted and again this is something that I think would benefit him to probably come more frequently for nurse visits. 02/19/2021 upon evaluation today patient actually appears to be doing quite well in regard to his wound. He has been tolerating the dressing Burda, Megan ([id number]) changes without complication and overall I think that this is making excellent progress. I do not see any evidence of active infection at this point which is great news as well. No fevers, chills, nausea, vomiting, or diarrhea. 10/10; wound is made nice progress healthy granulation with a nice rim of epithelialization which seems to be expanding even from last week he has a deeper area in the inferior part of the more distal part of the wound with not quite as healthy as surface. This area will need to be followed. Using clobetasol and Hydrofera Blue 03/05/2021 upon evaluation today patient appears to be doing very well in regard to his leg ulcer. He has been tolerating dressing changes without  complication. Fortunately there does not appear to be any signs of active infection which is great news and overall I am extremely pleased with where we stand currently. 03/12/2021 upon evaluation today patient appears to be doing well with regard to his wound in fact this is extremely extremely good based on what we are seeing today there does not appear to be any signs of active infection and overall I think that he is doing awesome from the standpoint of healing in general. I am extremely pleased with how things seem to be progressing with regard to this pyoderma. Clobetasol has done wonders for him. I think the compression wrapping has also been of great benefit. 03/19/2021 upon evaluation today patient appears to be doing well with regard to his wound. He is tolerating the dressing changes without complication. In fact I feel like that he is actually making excellent progress at this point based on what I am seeing. No fevers, chills, nausea, vomiting, or diarrhea. 03/26/2021 upon evaluation today patient appears to be doing  well with regard to his wound. This again is measuring smaller and looking better. Again the progress is slow but nonetheless continual with what we have been seeing. I do believe that the current plan is doing awesome for him. 04/09/2021 upon evaluation today patient appears to be doing well with regard to his leg ulcer. This is showing signs of excellent improvement the muscle is completely closed over and there does not appear to be any evidence of inflammation at this point his drainage is significantly improved. Overall I think that he would be a good candidate for looking into a skin substitute at this point as well. We will get a look into some approvals in that regard. Potentially TheraSkin as well as Apligraf could both be considered just depending on insurance coverage. 04/16/2021 upon evaluation today patient appears to be doing well at this point. He has been  approved for the Apligraf which we could definitely order although I would like to try to get the TheraSkin approved if at all possible. I did fax notes into them today and I Georgina Peer try to give a call as well. Overall the wound appears to be doing decently well today. 04/20/2021 upon evaluation today patient actually appears to be doing quite well in regard to his wound with that being said we are trying to see what we can do about speeding up the healing process. For that reason we did discuss the possibility of a skin substitute. We got the Apligraf approved. For that reason we will get a go ahead and see what we can do with the Apligraf at this point. I am still trying to get the TheraSkin approved but I have not heard anything from the insurance company yet I have called and talked to them earlier in the week and to be honest this was about half an hour that I spent on the phone they told me I would hear something within 10-15 business days. Electronic Signature(s) Signed: 04/20/2021 4:47:51 PM By: Worthy Keeler PA-C Entered By: Worthy Keeler on 04/20/2021 16:47:51 [name], [name] ([id number]) -------------------------------------------------------------------------------- Physical Exam Details Patient Name: [name] Date of Service: 04/20/2021 2:45 PM Medical Record Number: [id number] Patient Account Number: [id number] Date of Birth/Sex: [birth year] (40 y.o. M) Treating RN: Donnamarie Poag Primary Care Provider: [name] Other Clinician: Referring Provider: [name] Treating Provider/Extender: [name] Cliche in Treatment: 71 Constitutional Well-nourished and well-hydrated in no acute distress. Respiratory normal breathing without difficulty. Psychiatric this patient is able to make decisions and demonstrates good insight into disease process. Alert and Oriented x 3. pleasant and cooperative. Notes Upon inspection patient's wound bed actually showed signs of good  granulation and epithelization at this point. Fortunately there does not appear to be any evidence of active infection which is great news and overall I am extremely pleased with where we stand today. I did actually perform application of the first Apligraf today and the patient tolerated this without complication. I secured it with Mepitel and Steri-Strips after applying a liberal amount of Skin-Prep around the edges to help protect the skin. Hopefully this will do the job and not allow anything to breakdown. The good news is not draining like it was in the past so hopefully we will not have any issues in that regard either. Electronic Signature(s) Signed: 04/20/2021 5:22:17 PM By: Worthy Keeler PA-C Entered By: Worthy Keeler on 04/20/2021 17:22:16 [name], [name] ([id number]) -------------------------------------------------------------------------------- Physician Orders Details Patient Name: [name] Date of Service: 04/20/2021 2:45  PM Medical Record Number: [id number] Patient Account Number: [id number] Date of Birth/Sex: March 29, 1981 (40 y.o. M) Treating RN: Carlene Coria Primary Care Provider: [name] Other Clinician: Referring Provider: [name] Treating Provider/Extender: [name] Cliche in Treatment: 228 Verbal / Phone Orders: No Diagnosis Coding ICD-10 Coding Code Description I87.2 Venous insufficiency (chronic) (peripheral) L97.222 Non-pressure chronic ulcer of left calf with fat layer exposed E11.622 Type 2 diabetes mellitus with other skin ulcer L88 Pyoderma gangrenosum F17.208 Nicotine dependence, unspecified, with other nicotine-induced disorders Follow-up Appointments o Return Appointment in 1 week. o Nurse Visit as needed - twice a week Bathing/ Shower/ Hygiene o Clean wound with Normal Saline or wound cleanser. Cellular or Tissue Based Products o Cellular or Tissue Based Product Type: - APLIGRAF #1 o Cellular or Tissue Based Product  applied to wound bed; including contact layer, fixation with steri-strips, dry gauze and cover dressing. (DO NOT REMOVE). Edema Control - Lymphedema / Segmental Compressive Device / Other o Optional: One layer of unna paste to top of compression wrap (to act as an anchor). o Elevate, Exercise Daily and Avoid Standing for Long Periods of Time. o Elevate legs to the level of the heart and pump ankles as often as possible o Elevate leg(s) parallel to the floor when sitting. Wound Treatment Wound #1 - Lower Leg Wound Laterality: Left, Lateral Cleanser: Wound Cleanser 3 x Per Week/30 Days Discharge Instructions: Wash your hands with soap and water. Remove old dressing, discard into plastic bag and place into trash. Cleanse the wound with Wound Cleanser prior to applying a clean dressing using gauze sponges, not tissues or cotton balls. Do not scrub or use excessive force. Pat dry using gauze sponges, not tissue or cotton balls. Peri-Wound Care: Moisturizing Lotion 3 x Per Week/30 Days Discharge Instructions: Suggestions: Theraderm, Eucerin, Cetaphil, or patient preference. Secondary Dressing: Xtrasorb Medium 4x5 (in/in) 3 x Per Week/30 Days Discharge Instructions: Apply to wound as directed. Do not cut. Compression Wrap: Medichoice 4 layer Compression System, 35-40 mmHG (Generic) 3 x Per Week/30 Days Discharge Instructions: Apply multi-layer wrap as directed. Electronic Signature(s) Signed: 04/20/2021 4:25:56 PM By: Carlene Coria RN Signed: 04/20/2021 5:38:58 PM By: Worthy Keeler PA-C Entered By: Carlene Coria on 04/20/2021 15:35:18 [name], [name] ([id number]) -------------------------------------------------------------------------------- Problem List Details Patient Name: [name] Date of Service: 04/20/2021 2:45 PM Medical Record Number: [id number] Patient Account Number: [id number] Date of Birth/Sex: Jan 22, 1981 (40 y.o. M) Treating RN: Donnamarie Poag Primary Care Provider: [name] Other Clinician: Referring Provider: [name] Treating Provider/Extender: [name] Cliche in Treatment: 228 Active Problems ICD-10 Encounter Code Description Active Date MDM Diagnosis I87.2 Venous insufficiency (chronic) (peripheral) 12/04/2016 No Yes L97.222 Non-pressure chronic ulcer of left calf with fat layer exposed 12/04/2016 No Yes E11.622 Type 2 diabetes mellitus with other skin ulcer 04/09/2021 No Yes L88 Pyoderma gangrenosum 03/26/2017 No Yes F17.208 Nicotine dependence, unspecified, with other nicotine-induced disorders 04/24/2020 No Yes Inactive Problems ICD-10 Code Description Active Date Inactive Date L97.213 Non-pressure chronic ulcer of right calf with necrosis of muscle 04/02/2017 04/02/2017 Resolved Problems ICD-10 Code Description Active Date Resolved Date L97.321 Non-pressure chronic ulcer of left ankle limited to breakdown of skin 10/08/2019 10/08/2019 L03.116 Cellulitis of left lower limb 05/24/2019 05/24/2019 Electronic Signature(s) Signed: 04/20/2021 3:08:51 PM By: Worthy Keeler PA-C Entered By: Worthy Keeler on 04/20/2021 15:08:51 [name] ([id number]) -------------------------------------------------------------------------------- Progress Note Details Patient Name: [name] Date of Service: 04/20/2021 2:45 PM Medical Record Number: [id number] Patient Account Number: [id number] Date of Birth/Sex: 10-Oct-1980 (  40 y.o. M) Treating RN: Donnamarie Poag Primary Care Provider: [name] Other Clinician: Referring Provider: [name] Treating Provider/Extender: [name] Cliche in Treatment: 228 Subjective Chief Complaint Information obtained from Patient He is here in follow up evaluation for LLE pyoderma ulcer History of Present Illness (HPI) 12/04/16; 40 year old man who comes into the clinic today for review of a wound on the posterior left calf. He tells me that is been there for about a year. He is not a diabetic he does  smoke half a pack per day. He was seen in the ER on 11/20/16 felt to have cellulitis around the wound and was given clindamycin. An x-ray did not show osteomyelitis. The patient initially tells me that he has a milk allergy that sets off a pruritic itching rash on his lower legs which she scratches incessantly and he thinks that's what may have set up the wound. He has been using various topical antibiotics and ointments without any effect. He works in a [employer] and is on his feet all day. He does not have a prior history of wounds however he does have the rash on both lower legs the right arm and the ventral aspect of his left arm. These are excoriations and clearly have had scratching however there are of macular looking areas on both legs including a substantial larger area on the right leg. This does not have an underlying open area. There is no blistering. The patient tells me that 2 years ago in [place] in response to the rash on his legs he saw a dermatologist who told him he had a condition which may be pyoderma gangrenosum although I may be putting words into his mouth. He seemed to recognize this. On further questioning he admits to a 5 year history of quiesced. ulcerative colitis. He is not in any treatment for this. He's had no recent travel 12/11/16; the patient arrives today with his wound and roughly the same condition we've been using silver alginate this is a deep punched out wound with some surrounding erythema but no tenderness. Biopsy I did did not show confirmed pyoderma gangrenosum suggested nonspecific inflammation and vasculitis but does not provide an actual description of what was seen by the pathologist. I'm really not able to understand this We have also received information from the patient's dermatologist in [place] notes from April 2016. This was a doctor Agarwal-antal. 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 He was prescribed topical steroid high potency  under occlusion which helped but at this point the patient did not have a deep punched out wound. 12/18/16; the patient's wound is larger in terms of surface area however this surface looks better and there is less depth. The surrounding erythema also is better. The patient states that the wrap we put on came off 2 days ago when he has been using his compression stockings. He we are in the process of getting a dermatology consult. 12/26/16 on evaluation today patient's left lower extremity wound shows evidence of infection with surrounding erythema noted. He has been tolerating the dressing changes but states that he has noted more discomfort. There is a larger area of erythema surrounding the wound. No fevers, chills, nausea, or vomiting noted at this time. With that being said the wound still does have slough covering the surface. He is not allergic to any medication that he is aware of at this point. In regard to his right lower extremity he had several regions that are erythematous  and pruritic he wonders if there's anything we can do to help that. 01/02/17 I reviewed patient's wound culture which was obtained his visit last week. He was placed on doxycycline at that point. Unfortunately that does not appear to be an antibiotic that would likely help with the situation however the pseudomonas noted on culture is sensitive to Cipro. Also unfortunately patient's wound seems to have a large compared to last week's evaluation. Not severely so but there are definitely increased measurements in general. He is continuing to have discomfort as well he writes this to be a seven out of 10. In fact he would prefer me not to perform any debridement today due to the fact that he is having discomfort and considering he has an active infection on the little reluctant to do so anyway. No fevers, chills, nausea, or vomiting noted at this time. 01/08/17; patient seems dermatology on September 5. I suspect dermatology  will want the slides from the biopsy I did sent to their pathologist. I'm not sure if there is a way we can expedite that. In any case the culture I did before I left on vacation 3 weeks ago showed Pseudomonas he was given 10 days of Cipro and per her description of her intake nurses is actually somewhat better this week although the wound is quite a bit bigger than I remember the last time I saw this. He still has 3 more days of Cipro 01/21/17; dermatology appointment tomorrow. He has completed the ciprofloxacin for Pseudomonas. Surface of the wound looks better however he is had some deterioration in the lesions on his right leg. Meantime the left lateral leg wound we will continue with sample 01/29/17; patient had his dermatology appointment but I can't yet see that note. He is completed his antibiotics. The wound is more superficial but considerably larger in circumferential area than when he came in. This is in his left lateral calf. He also has swollen erythematous areas with superficial wounds on the right leg and small papular areas on both arms. There apparently areas in her his upper thighs and buttocks I did not look at those. Dermatology biopsied the right leg. Hopefully will have their input next week. 02/05/17; patient went back to see his dermatologist who told him that he had a "scratching problem" as well as staph. He is now on a 30 day course of doxycycline and I believe she gave him triamcinolone cream to the right leg areas to help with the itching [not exactly sure but probably triamcinolone]. She apparently looked at the left lateral leg wound although this was not rebiopsied and I think felt to be ultimately part of the same pathogenesis. He is using sample border foam and changing nevus himself. He now has a new open area on the right posterior leg which was his biopsy site I don't have any of the dermatology notes 02/12/17; we put the patient in compression last week with SANTYL  to the wound on the left leg and the biopsy. Edema is much better and the depth of the wound is now at level of skin. Area is still the same Biopsy site on the right lateral leg we've also been using santyl with a border foam dressing and he is changing this himself. 02/19/17; Using silver alginate started last week to both the substantial left leg wound and the biopsy site on the right wound. He is tolerating compression well. Has a an appointment with his primary M.D. tomorrow wondering about diuretics although  I'm wondering if the edema problem is actually lymphedema [name], [name] ([id number]) 02/26/17; the patient has been to see his primary doctor Dr. Jerrel Ivory at [facility] our primary care. She started him on Lasix 20 mg and this seems to have helped with the edema. However we are not making substantial change with the left lateral calf wound and inflammation. The biopsy site on the right leg also looks stable but not really all that different. 03/12/17; the patient has been to see vein and vascular Dr. Lucky Cowboy. He has had venous reflux studies I have not reviewed these. I did get a call from his dermatology office. 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 They therefore recommended clobetasol and tetracycline i.e. aggressive treatment for possible pyoderma gangrenosum. 03/26/17; apparently the patient just had reflux studies not an appointment with Dr. dew. She arrives in clinic today having applied clobetasol for 2-3 weeks. He notes over the last 2-3 days excessive drainage having to change the dressing 3-4 times a day and also expanding erythema. He states the expanding erythema seems to come and go and was last this red was  earlier in the month.he is on doxycycline 150 mg twice a day as an anti-inflammatory systemic therapy for possible pyoderma gangrenosum along with the topical clobetasol 04/02/17; the patient was seen last week by Dr. Lillia Carmel at [facility] dermatology locally who kindly saw him at my request. 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 My concern was the degree of erythema medially extending from his left leg wound which was either inflammation from pyoderma or cellulitis. I put him on Augmentin however culture of the wound showed Pseudomonas which is quinolone sensitive. I really don't believe he has cellulitis however in view of everything I will continue and give him a course of Cipro. He is also on doxycycline as an immune modulator for the pyoderma. In addition to his original wound on the left lateral leg with surrounding erythema he has a wound on the right posterior calf which was an original biopsy site done by dermatology. 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 Saw Dr. Lillia Carmel yesterday. He has been using topical antibiotics to both wound areas his original wound on the left and the biopsies/pathergy area on the right. There is definitely some improvement in the inflammation around the wound on the right although the patient states he has increasing sensitivity of the wounds. He is on prednisone 60 and doxycycline 1 as prescribed by Dr. Lillia Carmel. He is covering the topical antibiotic with gauze and putting this in his own compression stocks and changing this daily. He states that Dr. Lottie Rater did a culture of the left leg wound yesterday 05/07/17; pyoderma gangrenosum. The patient saw Dr. Lillia Carmel yesterday and has a follow-up with her in one month. He is still using topical antibiotics to both wounds although he can't recall exactly what type. He is still on prednisone  60 mg. Dr. Lillia Carmel stated that the doxycycline could stop if we were in agreement. 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 The  right medial leg was induced by biopsy/pathergy. The area on the right is essentially healed. 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 The right medial leg has closed and remains closed. He is still on prednisone 60. He tells me he missed his last dermatology appointment with Dr. Lillia Carmel but will make another appointment. He reports that her blood sugar at a recent screen in [place] was high 200's. He was 180 today. 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 I think he is going to require still much longer prednisone perhaps another 3 months before attempting to taper. In the meantime his wound is a lot better. Smaller. He is cleaning this off daily and applying topical antibiotics. When he was last in the clinic I thought about changing to [facility] and actually put in a couple of calls to dermatology although probably not during their business hours. 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 He continues on oral prednisone. He has been using triple antibiotic ointment. There is surface debris and we will transition to [facility] and have him return in 2 weeks. He has lost 30 pounds since his last appointment with lifestyle modification. He may benefit from topical steroid cream for treatment this can be considered at a later date. 08/22/17 on evaluation today patient appears to actually be doing rather well in regard to his left lateral lower extremity ulcer. He has actually been managed by Dr. Dellia Nims most recently. Patient is currently on  oral steroids at this time. This seems to have been of benefit for him. Nonetheless his last visit was actually with Leah on 08/06/17. Currently he is not utilizing any topical steroid creams although this could be of benefit as well. No fevers, chills, nausea, or vomiting noted at this time. 09/05/17 on evaluation today patient appears to be doing better in regard to his left lateral lower extremity ulcer. He has been tolerating the dressing changes without complication. He is using Santyl with good effect. Overall I'm very pleased with how things are standing at this point. Patient likewise is happy that this is doing better. 09/19/17 on evaluation today patient actually appears to be doing rather well in regard to his left lateral lower extremity ulcer. 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 He's not having any significant pain. 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 He has been tolerating the Santyl without complication and in general I feel like he's making good progress. 10/17/17 on evaluation today patient appears to be doing very well in regard to his left lateral lower surety ulcer. He has been tolerating the dressing changes without complication. There does not appear to be any evidence of infection he's alternating the Santyl and the triple antibiotic ointment every other day this seems to be doing well for him. 11/03/17 on evaluation today patient appears to be doing very well in regard to his left lateral lower extremity ulcer. He is been tolerating the dressing changes without complication which is good news. Fortunately there does not appear to be any evidence of infection which is also great news. Overall is doing  excellent they are starting to taper down on the prednisone is down to 40 mg at this  point it also started topical clobetasol for him. 11/17/17 on evaluation today patient appears to be doing well in regard to his left lateral lower surety ulcer. He's been tolerating the dressing changes without complication. He does note that he is having no pain, no excessive drainage or discharge, and overall he feels like things are going about how he would expect and hope they would. Overall he seems to have no evidence of infection at this time in my opinion which is good news. 12/04/17-He is seen in follow-up evaluation for right lateral lower extremity ulcer. He has been applying topical steroid cream. Today's measurement show slight increase in size. Over the next 2 weeks we will transition to every other day Santyl and steroid cream. He has been encouraged to monitor for changes and notify clinic with any concerns 12/15/17 on evaluation today patient's left lateral motion the ulcer and fortunately is doing worse again at this point. This just since last week to this week has close to doubled in size according to the patient. I did not seeing last week's I do not have a visual to compare this to in our system was also down so we do not have all the charts and at this point. Nonetheless it does have me somewhat concerned in regard to the fact that again he was worried enough about it he has contact the dermatology that placed them back on the full strength, 50 mg a day of the prednisone that he was taken previous. He continues to alternate using clobetasol along with Santyl at this point. He is obviously somewhat frustrated. THANG, FLETT ([id number]) 12/22/17 on evaluation today patient appears to be doing a little worse compared to last evaluation. Unfortunately the wound is a little deeper and slightly larger than the last week's evaluation. With that being said he has made some progress in regard to the irritation surrounding at this time unfortunately despite that progress that's been made he  still has a significant issue going on here. 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 For that reason I discussed with him today the possibility of perform a wound culture to ensure there's nothing overtly infected. 01/06/18 on evaluation today patient's wound is larger and deeper than previously evaluated. With that being said it did appear that his wound was infected after my last evaluation with him. Subsequently I did end up prescribing a prescription for Bactrim DS which she has been taking and having no complication with. Fortunately there does not appear to be any evidence of infection at this point in time as far as anything spreading, no want to touch, and overall I feel like things are showing signs of improvement. 01/13/18 on evaluation today patient appears to be even a little larger and deeper than last time. There still muscle exposed in the base of the wound. Nonetheless he does appear to be less erythematous I do believe inflammation is calming down also believe the infection looks like it's probably resolved at this time based on what I'm seeing. No fevers, chills, nausea, or vomiting noted at this time. 01/30/18 on evaluation today patient actually appears to visually look better for the most part. Unfortunately those visually this looks better he does seem to potentially have what may be an  abscess in the muscle that has been noted in the central portion of the wound. This is the first time that I have noted what appears to be fluctuance in the central portion of the muscle. With that being said I'm somewhat more concerned about the fact that this might indicate an abscess formation at this location. I do believe that an ultrasound would be appropriate. This is likely something we need to try to do as soon as possible. He has been  switch to mupirocin ointment and he is no longer using the steroid ointment as prescribed by dermatology he sees them again next week he's been decreased from 60 to 40 mg of prednisone. 03/09/18 on evaluation today patient actually appears to be doing a little better compared to last time I saw him. There's not as much erythema surrounding the wound itself. He I did review his most recent infectious disease note which was dated 02/24/18. He saw Dr. Michel Bickers in [place]. 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 Patient is now off of antibiotics and they are continually observing this. There seems to be no change in the past two weeks in my pinion based on what the patient says and what I see today compared to what Dr. Megan Salon likely saw two weeks ago. No fevers, chills, nausea, or vomiting noted at this time. 03/23/18 on evaluation today patient's wound actually appears to be showing signs of improvement which is good news. He is currently still on the Dapsone. He is also working on tapering the prednisone to get off of this and Dr. Lottie Rater is working with him in this regard. Nonetheless overall I feel like the wound is doing well it does appear based on the infectious disease note that I reviewed from Dr. 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 I did also review the results of his ultrasound of left lower extremity which revealed there was a dentist tissue in the base of the wound without an abscess noted. 04/06/18 on evaluation today the patient's left lateral lower extremity ulcer actually appears to be doing fairly well which is excellent news. There does not appear to be any evidence of infection at this time which is also great news. Overall he still does have a significantly large  ulceration although little by little he seems to be making progress. He is down to 10 mg a day of the prednisone. 04/20/18 on evaluation today patient actually appears to be doing excellent at this time in regard to his left lower extremity ulcer. He's making signs of good progress unfortunately this is taking much longer than we would really like to see but nonetheless he is making progress. Fortunately there does not appear to be any evidence of infection at this time. No fevers, chills, nausea, or vomiting noted at this time. The patient has not been using the Santyl due to the cost he hadn't got in this field yet. He's mainly been using the antibiotic ointment topically. Subsequently he also tells me that he really has not been scrubbing in the shower I think this would be helpful again as I told him it doesn't have to be anything too aggressive to even make it believe just enough to keep it free of some of the loose slough and  biofilm on the wound surface. 05/11/18 on evaluation today patient's wound appears to be making slow but sure progress in regard to the left lateral lower extremity ulcer. He is been tolerating the dressing changes without complication. Fortunately there does not appear to be any evidence of infection at this time. He is still just using triple antibiotic ointment along with clobetasol occasionally over the area. He never got the Santyl and really does not seem to intend to in my pinion. 06/01/18 on evaluation today patient appears to be doing a little better in regard to his left lateral lower extremity ulcer. He states that overall he does not feel like he is doing as well with the Dapsone as he did with the prednisone. Nonetheless he sees his dermatologist later today and is gonna talk to them about the possibility of going back on the prednisone. Overall again I believe that the wound would be better if you would utilize Santyl but he really does not seem to be interested  in going back to the [place] at this point. He has been using triple antibiotic ointment. 06/15/18 on evaluation today patient's wound actually appears to be doing about the same at this point. Fortunately there is no signs of infection at this time. He has made slight improvements although he continues to not really want to clean the wound bed at this point. He states that he just doesn't mess with it he doesn't want to cause any problems with everything else he has going on. He has been on medication, antibiotics as prescribed by his dermatologist, for a staff infection of his lower extremities which is really drying out now and looking much better he tells me. Fortunately there is no sign of overall infection. 06/29/18 on evaluation today patient appears to be doing well in regard to his left lateral lower surety ulcer all things considering. Fortunately his staff infection seems to be greatly improved compared to previous. He has no signs of infection and this is drying up quite nicely. He is still the doxycycline for this is no longer on cental, Dapsone, or any of the other medications. His dermatologist has recommended possibility of an infusion but right now he does not want to proceed with that. 07/13/18 on evaluation today patient appears to be doing about the same in regard to his left lateral lower surety ulcer. Fortunately there's no signs of infection at this time which is great news. Unfortunately he still builds up a significant amount of Slough/biofilm of the surface of the wound he still is not really cleaning this as he should be appropriately. Again I'm able to easily with saline and gauze remove the majority of this on the surface which if you would do this at home would likely be a dramatic improvement for him as far as getting the area to improve. Nonetheless overall I still feel like he is making progress is just very slow. I think Santyl will be of benefit for him as well. Still he  has not gotten this as of this point. 07/27/18 on evaluation today patient actually appears to be doing little worse in regards of the erythema around the periwound region of the wound he also tells me that he's been having more drainage currently compared to what he was experiencing last time I saw him. He states not quite as bad as what he had because this was infected previously but nonetheless is still appears to be doing poorly. Fortunately there is no evidence of systemic  infection at this point. The patient tells me that he is not going to be able to afford the Santyl. He is still waiting to hear about the infusion therapy with his dermatologist. Apparently she wants an updated colonoscopy first. AMJAD, FIKES ([id number]) 08/10/18 on evaluation today patient appears to be doing better in regard to his left lateral lower extremity ulcer. Fortunately he is showing signs of improvement in this regard he's actually been approved for Remicade infusion's as well although this has not been scheduled as of yet. Fortunately there's no signs of active infection at this time in regard to the wound although he is having some issues with infection of the right lower extremity is been seen as dermatologist for this. Fortunately they are definitely still working with him trying to keep things under control. 09/07/18 on evaluation today patient is actually doing rather well in regard to his left lateral lower extremity ulcer. He notes these actually having some hair grow back on his extremity which is something he has not seen in years. He also tells me that the pain is really not giving them any trouble at this time which is also good news overall she is very pleased with the progress he's using a combination of the mupirocin along with the probate is all mixed. 09/21/18 on evaluation today patient actually appears to be doing fairly well all things considered in regard to his looks from the ulcer. He's  been tolerating the dressing changes without complication. Fortunately there's no signs of active infection at this time which is good news he is still on all antibiotics or prevention of the staff infection. He has been on prednisone for time although he states it is gonna contact his dermatologist and see if she put them on a short course due to some irritation that he has going on currently. Fortunately there's no evidence of any overall worsening this is going very slow I think cental would be something that would be helpful for him although he states that $50 for tube is quite expensive. He therefore is not willing to get that at this point. 10/06/18 on evaluation today patient actually appears to be doing decently well in regard to his left lateral leg ulcer. He's been tolerating the dressing changes without complication. Fortunately there's no signs of active infection at this time. Overall I'm actually rather pleased with the progress he's making although it's slow he doesn't show any signs of infection and he does seem to be making some improvement. I do believe that he may need a switch up and dressings to try to help this to heal more appropriately and quickly. 10/19/18 on evaluation today patient actually appears to be doing better in regard to his left lateral lower extremity ulcer. This is shown signs of having much less Slough buildup at this point due to the fact he has been using the [employer]. Obviously this is very good news. The overall size of the wound is not dramatically smaller but again the appearance is. 11/02/18 on evaluation today patient actually appears to be doing quite well in regard to his lower [place] ulcer. A lot of the skin around the ulcer is actually somewhat irritating at this point this seems to be more due to the dressing causing irritation from the adhesive that anything else. Fortunately there is no signs of active infection at this time. 11/24/18 on evaluation  today patient appears to be doing a little worse in regard to his overall appearance of his  lower extremity ulcer. There's more erythema and warmth around the wound unfortunately. He is currently on doxycycline which he has been on for some time. With that being said I'm not sure that seems to be helping with what appears to possibly be an acute cellulitis with regard to his left lower extremity ulcer. No fevers, chills, nausea, or vomiting noted at this time. 12/08/18 on evaluation today patient's wounds actually appears to be doing significantly better compared to his last evaluation. He has been using Santyl along with alternating tripling about appointment as well as the steroid cream seems to be doing quite well and the wound is showing signs of improvement which is excellent news. Fortunately there's no evidence of infection and in fact his culture came back negative with only normal skin flora noted. 12/21/2018 upon evaluation today patient actually appears to be doing excellent with regard to his ulcer. This is actually the best that I have seen it since have been helping to take care of him. It is both smaller as well as less slough noted on the surface of the wound and seems to be showing signs of good improvement with new skin growing from the edges. He has been using just the triamcinolone he does wonder if he can get a refill of that ointment today. 01/04/2019 upon evaluation today patient actually appears to be doing well with regard to his left lateral lower extremity ulcer. With that being said it does not appear to be that he is doing quite as well as last time as far as progression is concerned. There does not appear to be any signs of infection or significant irritation which is good news. With that being said I do believe that he may benefit from switching to a collagen based dressing based on how clean The wound appears. 01/18/2019 on evaluation today patient actually appears to be  doing well with regard to his wound on the left lower extremity. He is not made a lot of progress compared to where we were previous but nonetheless does seem to be doing okay at this time which is good news. There is no signs of active infection which is also good news. My only concern currently is I do wish we can get him into utilizing the collagen dressing his insurance would not pay for the supplies that we ordered although it appears that he may be able to order this through his supply company that he typically utilizes. This is [employer]. Nonetheless he did try to order it during the office visit today and it appears this did go through. We will see if he can get that it is a different brand but nonetheless he has collagen and I do think will be beneficial. 02/01/2019 on evaluation today patient actually appears to be doing a little worse today in regard to the overall size of his wounds. Fortunately there is no signs of active infection at this time. That is visually. Nonetheless when this is happened before it was due to infection. For that reason were somewhat concerned about that this time as well. 02/08/2019 on evaluation today patient unfortunately appears to be doing slightly worse with regard to his wound upon evaluation today. Is measuring a little deeper and a little larger unfortunately. I am not really sure exactly what is causing this to enlarge he actually did see his dermatologist she is going to see about initiating Humira for him. Subsequently she also did do steroid injections into the wound itself in the  periphery. Nonetheless still nonetheless he seems to be getting a little bit larger he is gone back to just using the steroid cream topically which I think is appropriate. I would say hold off on the collagen for the time being is definitely a good thing to do. Based on the culture results which we finally did get the final result back regarding it shows staph as the bacteria  noted again that can be a normal skin bacteria based on the fact however he is having increased drainage and worsening of the wound measurement wise I would go ahead and place him on an antibiotic today I do believe for this. 02/15/2019 on evaluation today patient actually appears to be doing somewhat better in regard to his ulcer. There is no signs of worsening at this time I did review his culture results which showed evidence of Staphylococcus aureus but not MRSA. Again this could just be more related to the normal skin bacteria although he states the drainage has slowed down quite a bit he may have had a mild infection not just colonization. And was much smaller and then since around10/04/2019 on evaluation today patient appears to be doing unfortunately worse as far as the size of the wound. I really feel like that this is steadily getting larger again it had been doing excellent right at the beginning of September we have seen a steady increase in the area of the wound it is almost 2-1/2 times the size it was on September 1. Obviously this is a bad trend this is not wanting to see. For that reason we went back to using just the topical triamcinolone cream which does seem to help with inflammation. I checked him for bacteria by way of culture and nothing showed positive there. I am considering giving him a short course of a tapering steroid Kery Batzel, Guenther ([id number]) today to see if that is can be beneficial for him. The patient is in agreement with giving that a try. 03/08/2019 on evaluation today patient appears to be doing very well in comparison to last evaluation with regard to his lower extremity ulcer. This is showing signs of less inflammation and actually measuring slightly smaller compared to last time every other week over the past month and a half he has been measuring larger larger larger. Nonetheless I do believe that the issue has been inflammation the prednisone does seem  to have been beneficial for him which is good news. No fevers, chills, nausea, vomiting, or diarrhea. 03/22/2019 on evaluation today patient appears to be doing about the same with regard to his leg ulcer. He has been tolerating the dressing changes without complication. With that being said the wound seems to be mostly arrested at its current size but really is not making any progress except for when we prescribed the prednisone. He did show some signs of dropping as far as the overall size of the wound during that interval week. Nonetheless this is something he is not on long-term at this point and unfortunately I think he is getting need either this or else the Humira which his dermatologist has discussed try to get approval for. With that being said he will be seeing his dermatologist on the 11th of this month that is November. 04/19/2019 on evaluation today patient appears to be doing really about the same the wound is measuring slightly larger compared to last time I saw him. He has not been into the office since November 2 due to the fact  that he unfortunately had Covid as that his entire family. He tells me that it was rough but they did pull-through and he seems to be doing much better. Fortunately there is no signs of active infection at this time. No fevers, chills, nausea, vomiting, or diarrhea. 05/10/2019 on evaluation today patient unfortunately appears to be doing significantly worse as compared to last time I saw him. He does tell me that he has had his first dose of Humira and actually is scheduled to get the next one in the upcoming week. With that being said he tells me also that in the past several days he has been having a lot of issues with green drainage she showed me a picture this is more blue-green in color. He is also been having issues with increased sloughy buildup and the wound does appear to be larger today. Obviously this is not the direction that we want everything to  take based on the starting of his Humira. Nonetheless I think this is definitely a result of likely infection and to be honest I think this is probably Pseudomonas causing the infection based on what I am seeing. 05/24/2019 on evaluation today patient unfortunately appears to be doing significantly worse compared to his prior evaluation with me 2 weeks ago. I did review his culture results which showed that he does have Staph aureus as well as Pseudomonas noted on the culture. Nonetheless the Levaquin that I prescribed for him does not appear to have been appropriate and in fact he tells me he is no longer experiencing the green drainage and discharge that he had at the last visit. Fortunately there is no signs of active infection at this time which is good news although the wound has significantly worsened it in fact is much deeper than it was previous. We have been utilizing up to this point triamcinolone ointment as the prescription topical of choice but at this time I really feel like that the wound is getting need to be packed in order to appropriately manage this due to the deeper nature of the wound. Therefore something along the lines of an alginate dressing may be more appropriate. 05/31/2019 upon inspection today patient's wound actually showed signs of doing poorly at this point. Unfortunately he just does not seem to be making any good progress despite what we have tried. He actually did go ahead and pick up the Cipro and start taking that as he was noticing more green drainage he had previously completed the Levaquin that I prescribed for him as well. Nonetheless he missed his appointment for the seventh last week on Wednesday with the wound care center and [facility] where his dermatologist referred him. Obviously I do think a second opinion would be helpful at this point especially in light of the fact that the patient seems to be doing so poorly despite the fact that we have tried  everything that I really know how at this point. The only thing that ever seems to have helped him in the past is when he was on high doses of continual steroids that did seem to make a difference for him. 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 06/07/2019 upon evaluation today patient unfortunately appears to be doing worse yet again with regard to his wound. In fact  I am starting to question whether or not he may have a fluid pocket in the muscle at this point based on the bulging and the soft appearance to the central portion of the muscle area. There is not anything draining from the muscle itself at this time which is good news but nonetheless the wound is expanding. I am not really seeing any results of the Humira as far as overall wound progression based on what I am seeing at this point. The patient has been referred for second opinion with regard to his wound to the [facility] wound care center by his dermatologist which I definitely am not in opposition to. Unfortunately we tried multiple dressings in the past including collagen, alginate, and at one point even Hydrofera Blue. With that being said he is never really used it for any significant amount of time due to the fact that he often complains of pain associated with these dressings and then will go back to either using the Santyl which she has done intermittently or more frequently the triamcinolone. He is also using his own compression stockings. We have wrapped him in the past but again that was something else that he really was not a big fan of. 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 He seemed to do much better when he was on the steroids although honestly I understand that the steroids are not necessarily the best medication to be on long-term obviously 06/14/2019 on evaluation today patient appears to be doing actually a little bit better with regard to the overall appearance with his leg. Unfortunately he does continue to have issues with what appears to be some fluid underneath the muscle although he did see the wound specialty center at [facility] last week their main goals were to see about infusion therapy in place of the Humira as they feel like that is not quite strong enough. They also recommended that we continue with the treatment otherwise as we are they felt like that was appropriate and they are okay with him continuing to follow-up here with [place] in that regard. With that being said they are also sending him to the vein specialist there to see about vein stripping and if that would be of benefit for him. Subsequently they also did not really address whether or not an ultrasound of the muscle area to see if there is anything that needs to be addressed here would be appropriate or not. For that reason I discussed this with him last week I think we may proceed down that road at this point. 06/21/2019 upon evaluation today patient's wound actually appears to be doing slightly better compared to previous evaluations. I do believe that he has made a difference with regard to the progression here with the use of oral steroids. Again in the past has been the only thing that is really calm things down. He does tell me that from [facility] is gotten a good news from there that there are no further vein stripping that is necessary at this point. I do not have that available for review today although the patient did relay this to me. He  also did obtain and have the  ultrasound of the wound completed which I did sign off on today. It does appear that there is no fluid collection under the muscle this is likely then just edematous tissue in general. That is also good news. Overall I still believe the inflammation is the main issue here. He did inquire about the possibility of a wound VAC again with the muscle protruding like it is I am not really sure whether the wound VAC is necessarily ideal or not. That is something we will have to consider although I do believe he may need compression wrapping to try to help with edema control which could potentially be of benefit. 06/28/2019 on evaluation today patient appears to be doing slightly better measurement wise although this is not terribly smaller he least seems to be trending towards that direction. With that being said he still seems to have purulent drainage noted in the wound bed at this time. He has been on Levaquin followed by Cipro over the past month. Unfortunately he still seems to have some issues with active infection at this time. I did perform a culture last week in order to evaluate and see if indeed there was still anything going on. Subsequently the culture did come back [place], [place] ([id number]) showing Pseudomonas which is consistent with the drainage has been having which is blue-green in color. He also has had an odor that again was somewhat consistent with Pseudomonas as well. Long story short it appears that the culture showed an intermediate finding with regard to how well the Cipro will work for the Pseudomonas infection. Subsequently being that he does not seem to be clearing up and at best what we are doing is just keeping this at [place] I think he may need to see infectious disease to discuss IV antibiotic options. 07/05/2019 upon evaluation today patient appears to be doing okay in regard to his leg ulcer. He has been tolerating the dressing changes at this point without complication.  Fortunately there is no signs of active infection at this time which is good news. No fevers, chills, nausea, vomiting, or diarrhea. With that being said he does have an appointment with infectious disease tomorrow and his primary care on Wednesday. Again the reason for the infectious disease referral was due to the fact that he did not seem to be fully resolving with the use of oral antibiotics and therefore we were thinking that IV antibiotic therapy may be necessary secondary to the fact that there was an intermediate finding for how effective the Cipro may be. Nonetheless again he has been having a lot of purulent and even green drainage. Fortunately right now that seems to have calmed down over the past week with the reinitiation of the oral antibiotic. Nonetheless we will see what Dr. Megan Salon has to say. 07/12/2019 upon evaluation today patient appears to be doing about the same at this point in regard to his left lower extremity ulcer. Fortunately there is no signs of active infection at this time which is good news I do believe the Levaquin has been beneficial I did review Dr. Hale Bogus note and to be honest I agree that the patient's leg does appear to be doing better currently. What we found in the past as he does not seem to really completely resolve he will stop the antibiotic and then subsequently things will revert back to having issues with blue-green drainage, increased pain, and overall worsening in general. Obviously that is the reason  I sent him back to infectious disease. 07/19/2019 upon evaluation today patient appears to be doing roughly the same in size there is really no dramatic improvement. He has started back on the Levaquin at this point and though he seems to be doing okay he did still have a lot of blue/green drainage noted on evaluation today unfortunately. I think that this is still indicative more likely of a Pseudomonas infection as previously noted and again he does see  Dr. Megan Salon in just a couple of days. I do not know that were really able to effectively clear this with just oral antibiotics alone based on what I am seeing currently. Nonetheless we are still continue to try to manage as best we can with regard to the patient and his wound. I do think the wrap was helpful in decreasing the edema which is excellent news. No fevers, chills, nausea, vomiting, or diarrhea. 07/26/2019 upon evaluation today patient appears to be doing slightly better with regard to the overall appearance of the muscle there is no dark discoloration centrally. Fortunately there is no signs of active infection at this time. No fevers, chills, nausea, vomiting, or diarrhea. Patient's wound bed currently the patient did have an appointment with Dr. Megan Salon at infectious disease last week. With that being said Dr. Megan Salon the patient states was still somewhat hesitant about put him on any IV antibiotics he wanted [place] to repeat cultures today and then see where things go going forward. He does look like Dr. Megan Salon because of some improvement the patient did have with the Levaquin wanted [place] to see about repeating cultures. If it indeed grows the Pseudomonas again then he recommended a possibility of considering a PICC line placement and IV antibiotic therapy. He plans to see the patient back in 1 to 2 weeks. 08/02/2019 upon evaluation today patient appears to be doing poorly with regard to his left lower extremity. We did get the results of his culture back it shows that he is still showing evidence of Pseudomonas which is consistent with the purulent/blue-green drainage that he has currently. Subsequently the culture also shows that he now is showing resistance to the oral fluoroquinolones which is unfortunate as that was really the only thing to treat the infection prior. I do believe that he is looking like this is going require IV antibiotic therapy to get this under control. Fortunately  there is no signs of systemic infection at this time which is good news. The patient does see Dr. Megan Salon tomorrow. 08/09/2019 upon evaluation today patient appears to be doing better with regard to his left lower extremity ulcer in regard to the overall appearance. He is currently on IV antibiotic therapy. As ordered by Dr. Megan Salon. Currently the patient is on ceftazidime which she is going to take for the next 2 weeks and then follow-up for 4 to 5-week appointment with Dr. Megan Salon. The patient started this this past Friday symptoms have not for a total of 3 days currently in full. 08/16/2019 upon evaluation today patient's wound actually does show muscle in the base of the wound but in general does appear to be much better as far as the overall evidence of infection is concerned. In fact I feel like this is for the most part cleared up he still on the IV antibiotics he has not completed the full course yet but I think he is doing much better which is excellent news. 08/23/2019 upon evaluation today patient appears to be doing about the same  with regard to his wound at this point. He tells me that he still has pain unfortunately. Fortunately there is no evidence of systemic infection at this time which is great news. There is significant muscle protrusion. 09/13/19 upon evaluation today patient appears to be doing about the same in regard to his leg unfortunately. He still has a lot of drainage coming from the ulceration there is still muscle exposed. With that being said the patient's last wound culture still showed an intermediate finding with regard to the Pseudomonas he still having the bluish/green drainage as well. Overall I do not know that the wound has completely cleared of infection at this point. Fortunately there is no signs of active infection systemically at this point which is good news. 09/20/2019 upon evaluation today patient's wound actually appears to be doing about the same based on  what I am seeing currently. I do not see any signs of systemic infection he still does have evidence of some local infection and drainage. He did see Dr. Megan Salon last week and Dr. Megan Salon states that he probably does need a different IV antibiotic although he does not want to put him on this until the patient begins the Remicade infusion which is actually scheduled for about 10 days out from today on 13 May. Following that time Dr. Megan Salon is good to see him back and then will evaluate the feasibility of starting him on the IV antibiotic therapy once again at that point. I do not disagree with this plan I do believe as Dr. Megan Salon stated in his note that I reviewed today that the patient's issue is multifactorial with the pyoderma being 1 aspect of this that were hoping the Remicade will be helpful for her. In the meantime I think the gentamicin is, helping to keep things under decent okay control in regard to the ulcer. 09/27/2019 upon evaluation today patient appears to be doing about the same with regard to his wound still there is a lot of muscle exposure though he does have some hyper granulation tissue noted around the edge and actually some granulation tissue starting to form over the muscle which is actually good news. Fortunately there is no evidence of active infection which is also good news. His pain is less at this point. 5/21; this is a patient I have not seen in a long time. 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 He has a large wound on the left lateral leg with protruding muscle. He comes in the clinic today showing the same area on his left medial ankle. He says there is been a spot there for some time although we have not previously defined this. Today he has a clearly defined area with slight amount of skin breakdown surrounded by raised areas with a purplish hue in color. This is not painful he says it is irritated. This looks distinctly  like I might imagine pyoderma starting 10/25/2019 upon evaluation today patient's wound actually appears to be making some progress. He still has muscle protruding from the lateral portion of his left leg but fortunately the new area that they were concerned about at his last visit does not appear to have opened at this point. He is currently on Remicade infusions and seems to be doing better in my opinion in fact the wound itself seems to be overall much better. The purplish discoloration that he did have seems to have resolved and I think that is a good sign that hopefully  the Remicade is doing its job. He does Strupp, [place] ([id number]) have some biofilm noted over the surface of the wound. 11/01/2019 on evaluation today patient's wound actually appears to be doing excellent at this time. Fortunately there is no evidence of active infection and overall I feel like he is making great progress. The Remicade seems to be due excellent job in my opinion. 11/08/19 evaluation today vision actually appears to be doing quite well with regard to his weight ulcer. He's been tolerating dressing changes without complication. Fortunately there is no evidence of infection. No fevers, chills, nausea, or vomiting noted at this time. Overall states that is having more itching than pain which is actually a good sign in my opinion. 12/13/2019 upon evaluation today patient appears to be doing well today with regard to his wound. He has been tolerating the dressing changes without complication. Fortunately there is no sign of active infection at this time. No fevers, chills, nausea, vomiting, or diarrhea. Overall I feel like the infusion therapy has been very beneficial for him. 01/06/2020 on evaluation today patient appears to be doing well with regard to his wound. This is measuring smaller and actually looks to be doing better. Fortunately there is no signs of active infection at this point. No fevers, chills, nausea,  vomiting, or diarrhea. With that being said he does still have the blue-green drainage but this does not seem to be causing any significant issues currently. He has been using the gentamicin that does seem to be keeping things under decent control at this point. He goes later this morning for his next infusion therapy for the pyoderma which seems to also be very beneficial. 02/07/2020 on evaluation today patient appears to be doing about the same in regard to his wounds currently. Fortunately there is no signs of active infection systemically he does still have evidence of local infection still using gentamicin. He also is showing some signs of improvement albeit slowly I do feel like we are making some progress here. 02/21/2020 upon evaluation today patient appears to be making some signs of improvement the wound is measuring a little bit smaller which is great news and overall I am very pleased with where he stands currently. He is going to be having infusion therapy treatment on the 15th of this month. Fortunately there is no signs of active infection at this time. 03/13/2020 I do believe patient's wound is actually showing some signs of improvement here which is great news. He has continue with the infusion therapy through rheumatology/dermatology at [facility]. That does seem to be beneficial. I still think he gets as much benefit from this as he did from the prednisone initially but nonetheless obviously this is less harsh on his body that the prednisone as far as they are concerned. 03/31/2020 on evaluation today patient's wound actually showing signs of some pretty good improvement in regard to the overall appearance of the wound bed. There is still muscle exposed though he does have some epithelial growth around the edges of the wound. Fortunately there is no signs of active infection at this time. No fevers, chills, nausea, vomiting, or diarrhea. 04/24/2020 upon evaluation today patient appears to be  doing about the same in regard to his leg ulcer. He has been tolerating the dressing changes without complication. Fortunately there is no signs of active infection at this time. No fevers, chills, nausea, vomiting, or diarrhea. With that being said he still has a lot of irritation from the bandaging around the  edges of the wound. We did discuss today the possibility of a referral to plastic surgery. 05/22/2020 on evaluation today patient appears to be doing well with regard to his wounds all things considered. He has not been able to get the Chantix apparently there is a recall nurse that I was unaware of put out by [employer] involuntarily. Nonetheless for now I am and I have to do some research into what may be the best option for him to help with quitting in regard to smoking and we discussed that today. 06/26/2020 upon evaluation today patient appears to be doing well with regard to his wound from the standpoint of infection I do not see any signs of infection at this point. With that being said unfortunately he is still continuing to have issues with muscle exposure and again he is not having a whole lot of new skin growth unfortunately. There does not appear to be any signs of active infection at this time. No fevers, chills, nausea, vomiting, or diarrhea. 07/10/2020 upon evaluation today patient appears to be doing a little bit more poorly currently compared to where he was previous. I am concerned currently about an active infection that may be getting worse especially in light of the increased size and tenderness of the wound bed. No fevers, chills, nausea, vomiting, or diarrhea. 07/24/2020 upon evaluation today patient appears to be doing poorly in regard to his leg ulcer. He has been tolerating the dressing changes without complication but unfortunately is having a lot of discomfort. 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  Subsequently I think he is going require possibly IV antibiotics to get this under control. I am very concerned about the severity of his infection and the amount of discomfort he is having. 07/31/2020 upon evaluation today patient appears to be doing about the same in regard to his leg wound. He did see Dr. Megan Salon and Dr. Megan Salon is actually going to start him on IV antibiotics. He goes for the PICC line tomorrow. With that being said there do not have that run for 2 weeks and then see how things are doing and depending on how he is progressing they may extend that a little longer. Nonetheless I am glad this is getting ready to be in place and definitely feel it may help the patient. In the meantime is been using mainly triamcinolone to the wound bed has an anti-inflammatory. 08/07/2020 on evaluation today patient appears to be doing well with regard to his wound compared even last week. In the interim he has gotten the PICC line placed and overall this seems to be doing excellent. There does not appear to be any evidence of infection which is great news systemically although locally of course has had the infection this appears to be improving with the use of the antibiotics. 08/14/2020 upon evaluation today patient's wound actually showing signs of excellent improvement. Overall the irritation has significantly improved the drainage is back down to more of a normal level and his pain is really pretty much nonexistent compared to what it was. Obviously I think that this is significantly improved secondary to the IV antibiotic therapy which has made all the difference in the world. Again he had a resistant form of Pseudomonas for which oral antibiotics just was not cutting it. Nonetheless I do think that still we need to consider the possibility of a surgical closure for this wound is been open so long and to  be honest with muscle exposed I think this can be very hard to get this to close outside of  this although definitely were still working to try to do what we can in that regard. 08/21/2020 upon evaluation today patient appears to be doing very well with regard to his wounds on the left lateral lower extremity/calf area. Fortunately there does not appear to be signs of active infection which is great news and overall very pleased with where things stand today. He is actually wrapping up his treatment with IV antibiotics tomorrow. After that we will see where things go from there. 08/28/2020 upon evaluation today patient appears to be doing decently well with regard to his leg ulcer. There does not appear to be any signs of Salberg, [place] ([id number]) active infection which is great news and overall very pleased with where things stand today. No fevers, chills, nausea, vomiting, or diarrhea. 09/18/2020 upon evaluation today patient appears to be doing well with regard to his infection which I feel like is better. Unfortunately he is not doing as well with regard to the overall size of the wound which is not nearly as good at this point. I feel like that he may be having an issue here with the pyoderma being somewhat out of control. I think that he may benefit from potentially going back and talking to the dermatologist about what to do from the pyoderma standpoint. I am not certain if the infusions are helping nearly as much is what the prednisone did in the past. 10/02/2020 upon evaluation today patient appears to be doing well with regard to his leg ulcer. He did go to the Psychiatric nurse. Unfortunately they feel like there is a 10% chance that most that he would be able to heal and that the skin graft would take. Obviously this has led him to not be able to go down that path as far as treatment is concerned. Nonetheless he does seem to be doing a little bit better with the prednisone that I gave him last time. I think that he may need to discuss with dermatology the possibility of long-term  prednisone as that seems to be what is most helpful for him to be perfectly honest. I am not sure the Remicade is really doing the job. 10/17/2020 upon evaluation today patient appears to be doing a little better in regard to his wound. In fact the case has been since we did the prednisone on May 2 for him that we have noticed a little bit of improvement each time we have seen a size wise as well as appearance wise as well as pain wise. I think the prednisone has had a greater effect then the infusion therapy has to be perfectly honest. With that being said the patient also feels significantly better compared to what he was previous. All of this is good news but nonetheless I am still concerned about the fact that again we are really not set up to long-term manage him as far as prednisone is concerned. Obviously there are things that you need to be watched I completely understand the risk of prednisone usage as well. 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 With all that being said I do believe that we can give him another round of the prednisone which she is requesting today because of the improvement that he seen since we did that first round. 10/30/2020 upon evaluation today patient's wound actually  is showing signs of doing quite well. There does not appear to be any evidence of infection which is great news and overall very pleased with where things stand today. No fevers, chills, nausea, vomiting, or diarrhea. He tells me that the prednisone still has seem to have helped he wonders if we can extend that for just a little bit longer. He did not have the appointment with a dermatologist although he did have an infusion appointment last Friday. That was at [facility]. With that being said he tells me he could not do both that as well as the appointment with the physician on the same day therefore that is can have to be rescheduled. I really want to see if  there is anything they feel like that could be done differently to try to help this out as I am not really certain that the infusions are helping significantly here. 11/13/2020 upon evaluation today patient unfortunately appears to be doing somewhat poorly in regard to his wound I feel like this is actually worsening from the standpoint of the pyoderma spreading. I still feel like that he may need something different as far as trying to manage this going forward. Again we did the prednisone unfortunately his blood sugars are not doing so well because of this. Nonetheless I believe that the patient likely needs to try topical steroid. We have done triamcinolone for a while I think going with something stronger such as clobetasol could be beneficial again this is not something I do lightly I discussed this with the patient that again this does not normally put underneath an occlusive dressing. Nonetheless I think a thin film as such could help with some of the stronger anti-inflammatory effects. We discussed this today. He would like to try to give this a trial for the next couple weeks. I definitely think that is something that we can do. Evaluate7/03/2021 and today patient's wound bed actually showed signs of doing really about the same. There was a little expansion of the size of the wound and that leading edge that we done looking out although the clobetasol does seem to have slowed this down a bit in my opinion. There is just 1 small area that still seems to be progressing based on what I see. Nonetheless I am concerned about the fact this does not seem to be improving if anything seems to be doing a little bit worse. I do not know that the infusions are really helping him much as next infusion is August 5 his appointment with dermatology is July 25. Either way I really think that we need to have a conversation potentially about this and I am actually going to see if I can talk with Dr. Lillia Carmel in  order to see where things stand as well. 12/11/2020 upon evaluation today patient appears to be doing worse in regard to his leg ulcer. Unfortunately I just do not think this is making the progress that I would like to see at this point. Honestly he does have an appointment with dermatology and this is in 2 days. I am wondering what they may have to offer to help with this. Right now what I am seeing is that he is continuing to show signs of worsening little by little. Obviously that is not great at all. Is the exact opposite of what we are looking for. 12/18/2020 upon evaluation today patient appears to be doing a little better in regard to his wound. The dermatologist actually did do some  steroid injections into the wound which does seem to have been beneficial in my opinion. That was on the 25th already this looks a little better to me than last time I saw him. With that being said we did do a culture and this did show that he has Staph aureus noted in abundance in the wound. With that being said I do think that getting him on an oral antibiotic would be appropriate as well. Also think we can compression wrap and this will make a difference as well. 12/28/2020 upon evaluation today patient's wound is actually showing signs of doing much better. I do believe the compression wrap is helping he has a lot of drainage but to be honest I think that the compression is helping to some degree in this regard as well as not draining through which is also good news. No fevers, chills, nausea, vomiting, or diarrhea. 01/04/2021 upon evaluation today patient appears to be doing well with regard to his wound. Overall things seem to be doing quite well. He did have a little bit of reaction to the CarboFlex Sorbact he will be using that any longer. With that being said he is controlled as far as the drainage is concerned overall and seems to be doing quite well. I do not see any signs of active infection at this time  which is great news. No fevers, chills, nausea, vomiting, or diarrhea. 01/11/2021 upon evaluation today patient appears to be doing well with regard to his wounds. He has been tolerating the dressing changes without complication. Fortunately there does not appear to be any signs of active infection at this time which is great news. Overall I am extremely pleased with where we stand currently. No fevers, chills, nausea, vomiting, or diarrhea. Where using clobetasol in the wound bed he has a lot of new skin growth which is awesome as well. 01/18/2021 upon evaluation today patient appears to be doing very well in regard to his leg ulcer. He has been tolerating the dressing changes without complication. Fortunately there does not appear to be any signs of active infection which is great news. In general I think that he is making excellent progress 01/25/2021 upon evaluation today patient appears to be doing well with regard to his wound on the leg. I am actually extremely pleased with where things stand today. There does not appear to be any signs of active infection which is great news and overall I think that we are definitely headed in the appropriate direction based on what I am seeing currently. There does not appear to be any signs of active infection also excellent news. 02/06/2021 upon evaluation today patient appears to be doing well with regard to his wound. Overall visually this is showing signs of significant Fehnel, Brack ([id number]) improvement which is great news. I do not see any signs of active infection systemically which is great even locally I do not think that we are seeing any major complications here. We did do fluorescence imaging with the [employer]ole[employer] DX today. The patient does have some odor and drainage noted and again this is something that I think would benefit him to probably come more frequently for nurse visits. 02/19/2021 upon evaluation today patient actually appears to be  doing quite well in regard to his wound. He has been tolerating the dressing changes without complication and overall I think that this is making excellent progress. I do not see any evidence of active infection at this point which is great news  as well. No fevers, chills, nausea, vomiting, or diarrhea. 10/10; wound is made nice progress healthy granulation with a nice rim of epithelialization which seems to be expanding even from last week he has a deeper area in the inferior part of the more distal part of the wound with not quite as healthy as surface. This area will need to be followed. Using clobetasol and Hydrofera Blue 03/05/2021 upon evaluation today patient appears to be doing very well in regard to his leg ulcer. He has been tolerating dressing changes without complication. Fortunately there does not appear to be any signs of active infection which is great news and overall I am extremely pleased with where we stand currently. 03/12/2021 upon evaluation today patient appears to be doing well with regard to his wound in fact this is extremely extremely good based on what we are seeing today there does not appear to be any signs of active infection and overall I think that he is doing awesome from the standpoint of healing in general. I am extremely pleased with how things seem to be progressing with regard to this pyoderma. Clobetasol has done wonders for him. I think the compression wrapping has also been of great benefit. 03/19/2021 upon evaluation today patient appears to be doing well with regard to his wound. He is tolerating the dressing changes without complication. In fact I feel like that he is actually making excellent progress at this point based on what I am seeing. No fevers, chills, nausea, vomiting, or diarrhea. 03/26/2021 upon evaluation today patient appears to be doing well with regard to his wound. This again is measuring smaller and looking better. Again the progress is  slow but nonetheless continual with what we have been seeing. I do believe that the current plan is doing awesome for him. 04/09/2021 upon evaluation today patient appears to be doing well with regard to his leg ulcer. This is showing signs of excellent improvement the muscle is completely closed over and there does not appear to be any evidence of inflammation at this point his drainage is significantly improved. Overall I think that he would be a good candidate for looking into a skin substitute at this point as well. We will get a look into some approvals in that regard. Potentially TheraSkin as well as Apligraf could both be considered just depending on insurance coverage. 04/16/2021 upon evaluation today patient appears to be doing well at this point. He has been approved for the Apligraf which we could definitely order although I would like to try to get the TheraSkin approved if at all possible. I did fax notes into them today and I Georgina Peer try to give a call as well. Overall the wound appears to be doing decently well today. 04/20/2021 upon evaluation today patient actually appears to be doing quite well in regard to his wound with that being said we are trying to see what we can do about speeding up the healing process. For that reason we did discuss the possibility of a skin substitute. We got the Apligraf approved. For that reason we will get a go ahead and see what we can do with the Apligraf at this point. I am still trying to get the TheraSkin approved but I have not heard anything from the insurance company yet I have called and talked to them earlier in the week and to be honest this was about half an hour that I spent on the phone they told me I would hear something  within 10-15 business days. Objective Constitutional Well-nourished and well-hydrated in no acute distress. Vitals Time Taken: 2:55 PM, Height: 71 in, Weight: 338 lbs, BMI: 47.1, Temperature: 98.3 F, Pulse: 130 bpm,  Respiratory Rate: 18 breaths/min, Blood Pressure: 165/84 mmHg. Respiratory normal breathing without difficulty. Psychiatric this patient is able to make decisions and demonstrates good insight into disease process. Alert and Oriented x 3. pleasant and cooperative. General Notes: Upon inspection patient's wound bed actually showed signs of good granulation and epithelization at this point. Fortunately there does not appear to be any evidence of active infection which is great news and overall I am extremely pleased with where we stand today. I did actually perform application of the first Apligraf today and the patient tolerated this without complication. I secured it with Mepitel and Steri-Strips after applying a liberal amount of Skin-Prep around the edges to help protect the skin. Hopefully this will do the job and not allow anything to breakdown. The good news is not draining like it was in the past so hopefully we will not have any issues in that regard either. Integumentary (Hair, Skin) Wound #1 status is Open. Original cause of wound was Gradually Appeared. The date acquired was: 11/18/2015. The wound has been in treatment 228 weeks. The wound is located on the Left,Lateral Lower Leg. The wound measures 5cm length x 2cm width x 0.4cm depth; 7.854cm^2 area and 3.142cm^3 volume. There is Fat Layer (Subcutaneous Tissue) exposed. There is no tunneling or undermining noted. There is a small amount of serosanguineous drainage noted. The wound margin is thickened. There is large (67-100%) red, hyper - granulation within the wound bed. Balbuena, Bascom ([id number]) There is no necrotic tissue within the wound bed. 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 A skin graft procedure using a bioengineered skin substitute/cellular or tissue based product was performed by Tommie Sams., PA-C with the following instrument(s): Blade, Forceps, and Scissors. Apligraf was applied and secured with Steri-Strips. 22 sq cm of product was utilized and 22 sq cm was wasted due to [place]. Post Application, adaptic was applied. A Time Out was conducted at 15:30, prior to the start of the procedure. The procedure was tolerated well with a pain level of 0 throughout and a pain level of 0 following the procedure. Post procedure Diagnosis Wound #1: Same as Pre-Procedure . Plan Follow-up Appointments: Return Appointment in 1 week. Nurse Visit as needed - twice a week Bathing/ Shower/ Hygiene: Clean wound with Normal Saline or wound cleanser. Cellular or Tissue Based Products: Cellular or Tissue Based Product Type: - APLIGRAF #1 Cellular or Tissue Based Product applied to wound bed; including contact layer, fixation with steri-strips, dry gauze and cover dressing. (DO NOT REMOVE). Edema Control - Lymphedema / Segmental Compressive Device / Other: Optional: One layer of unna paste to top of compression wrap (to act as an anchor). Elevate, Exercise Daily and Avoid Standing for Long Periods of Time. Elevate legs to the level of the heart and pump ankles as often as possible Elevate leg(s) parallel to the floor when sitting. WOUND #1: - Lower Leg Wound Laterality: Left, Lateral Cleanser: Wound Cleanser 3 x Per Week/30 Days Discharge Instructions: Wash your hands with soap and water. Remove  old dressing, discard into plastic bag and place into trash. Cleanse the wound with Wound Cleanser prior to applying a clean dressing using gauze sponges, not tissues or cotton balls. Do not scrub or use excessive force. Pat dry using gauze sponges, not  tissue or cotton balls. Peri-Wound Care: Moisturizing Lotion 3 x Per Week/30 Days Discharge Instructions: Suggestions: Theraderm, Eucerin, Cetaphil, or patient preference. Secondary Dressing: Xtrasorb Medium 4x5 (in/in) 3 x Per Week/30 Days Discharge Instructions: Apply to wound as directed. Do not cut. Compression Wrap: Medichoice 4 layer Compression System, 35-40 mmHG (Generic) 3 x Per Week/30 Days Discharge Instructions: Apply multi-layer wrap as directed. 1. Would recommend currently that we go ahead and continue with the Apligraf applications we will plan for that for next week as well. If you get approval for the TheraSkin we can always look towards that for the following week. 2. I am also can recommend that the patient continue with the XtraSorb to cover and then the 4-layer compression wrap we will stick with 3 times a week right now although we may go to 2 times a week if we do not see need all 3 appointments. We will see patient back for reevaluation in 1 week here in the clinic. If anything worsens or changes patient will contact our office for additional recommendations. JAKORY, MATSUO ([id number]) Electronic Signature(s) Signed: 04/20/2021 5:22:43 PM By: Worthy Keeler PA-C Entered By: Worthy Keeler on 04/20/2021 17:22:42 JAHEEM, HEDGEPATH ([id number]) -------------------------------------------------------------------------------- SuperBill Details Patient Name: [name] Date of Service: 04/20/2021 Medical Record Number: [id number] Patient Account Number: [id number] Date of Birth/Sex: 08/19/1980 (40 y.o. M) Treating RN: Carlene Coria Primary Care Provider: [name] Other Clinician: Referring Provider: [name] Treating Provider/Extender: [name] Cliche in Treatment: 228 Diagnosis Coding ICD-10 Codes Code Description I87.2 Venous insufficiency (chronic) (peripheral) L97.222 Non-pressure chronic ulcer of left calf with fat layer exposed E11.622 Type 2  diabetes mellitus with other skin ulcer L88 Pyoderma gangrenosum F17.208 Nicotine dependence, unspecified, with other nicotine-induced disorders Facility Procedures CPT4 Code: 84132440 Description: [phone] (Facility Use Only) Apligraf 44 SQ CM Modifier: Quantity: [place] Code: [id number] Description: 47425 - SKIN SUB GRAFT TRNK/ARM/LEG Modifier: Quantity: 1 CPT4 Code: Description: ICD-10 Diagnosis Description E11.622 Type 2 diabetes mellitus with other skin ulcer Modifier: Quantity: Physician Procedures CPT4 Code: [id number] Description: 64332 - WC PHYS SKIN SUB GRAFT TRNK/ARM/LEG Modifier: Quantity: 1 CPT4 Code: Description: ICD-10 Diagnosis Description E11.622 Type 2 diabetes mellitus with other skin ulcer Modifier: Quantity: Electronic Signature(s) Signed: 04/20/2021 5:22:58 PM By: Worthy Keeler PA-C Previous Signature: 04/20/2021 4:34:25 PM Version By: Carlene Coria RN Entered By: Worthy Keeler on 04/20/2021 17:22:57

## 2021-04-20 NOTE — Progress Notes (Signed)
LEVIN, DAGOSTINO ([id number]) Visit Report for 04/20/2021 Arrival Information Details Patient Name: LUMAN, [name] Date of Service: 04/20/2021 2:45 PM Medical Record Number: [id number] Patient Account Number: [id number] Date of Birth/Sex: 04-19-1981 (40 y.o. M) Treating RN: Carlene Coria Primary Care Yasamin Karel: [name] Other Clinician: Referring Bilaal Leib: [name] Treating Hayzel Ruberg/Extender: [name] Cliche in Treatment: 48 Visit Information History Since Last Visit All ordered tests and consults were completed: No Patient Arrived: Ambulatory Added or deleted any medications: No Arrival Time: 14:53 Any new allergies or adverse reactions: No Accompanied By: self Had a fall or experienced change in No Transfer Assistance: None activities of daily living that may affect Patient Identification Verified: Yes risk of falls: Secondary Verification Process Completed: Yes Signs or symptoms of abuse/neglect since last visito No Patient Requires Transmission-Based No Hospitalized since last visit: No Precautions: Implantable device outside of the clinic excluding No Patient Has Alerts: Yes cellular tissue based products placed in the center Patient Alerts: Patient has reaction since last visit: to Has Dressing in Place as Prescribed: Yes silver dressings. Pain Present Now: No Electronic Signature(s) Signed: 04/20/2021 4:25:56 PM By: Carlene Coria RN Entered By: Carlene Coria on 04/20/2021 14:55:41 [name] ([id number]) -------------------------------------------------------------------------------- Clinic Level of Care Assessment Details Patient Name: SYLAS, TWOMBLY Date of Service: 04/20/2021 2:45 PM Medical Record Number: [id number] Patient Account Number: [id number] Date of Birth/Sex: [birth year] (39 y.o. M) Treating RN: Carlene Coria Primary Care Sebastyan Snodgrass: [name] Other Clinician: Referring Lexington Devine: [name] Treating Warwick Nick/Extender: [name] Cliche in Treatment: 228 Clinic Level of Care Assessment Items TOOL 1 Quantity Score []  - Use when EandM and Procedure is performed on INITIAL visit 0 ASSESSMENTS - Nursing Assessment / Reassessment []  - General Physical Exam (combine w/ comprehensive assessment (listed just below) when performed on new 0 pt. evals) []  - 0 Comprehensive Assessment (HX, ROS, Risk Assessments, Wounds Hx, etc.) ASSESSMENTS - Wound and Skin Assessment / Reassessment []  - Dermatologic / Skin Assessment (not related to wound area) 0 ASSESSMENTS - Ostomy and/or Continence Assessment and Care []  - Incontinence Assessment and Management 0 []  - 0 Ostomy Care Assessment and Management (repouching, etc.) PROCESS - Coordination of Care []  - Simple Patient / Family Education for ongoing care 0 []  - 0 Complex (extensive) Patient / Family Education for ongoing care []  - 0 Staff obtains Programmer, systems, Records, Test Results / Process Orders []  - 0 Staff telephones HHA, Nursing Homes / Clarify orders / etc []  - 0 Routine Transfer to another Facility (non-emergent condition) []  - 0 Routine Hospital Admission (non-emergent condition) []  - 0 New Admissions / Biomedical engineer / Ordering NPWT, Apligraf, etc. []  - 0 Emergency Hospital Admission (emergent condition) PROCESS - Special Needs []  - Pediatric / Minor Patient Management 0 []  - 0 Isolation Patient Management []  - 0 Hearing / Language / Visual special needs []  - 0 Assessment of Community assistance (transportation, D/C planning, etc.) []  - 0 Additional assistance / Altered mentation []  - 0 Support Surface(s) Assessment (bed, cushion, seat, etc.) INTERVENTIONS - Miscellaneous []  - External ear exam 0 []  - 0 Patient Transfer (multiple staff / Civil Service fast streamer / Similar devices) []  - 0 Simple Staple / Suture removal (25 or less) []  - 0 Complex Staple / Suture removal (26 or more) []  - 0 Hypo/Hyperglycemic Management (do not check if billed  separately) []  - 0 Ankle / Brachial Index (ABI) - do not check if billed separately Has the patient been seen at the hospital within the last three years: Yes  Total Score: 0 Level Of Care: ____ [name] ([id number]) Electronic Signature(s) Signed: 04/20/2021 4:25:56 PM By: Carlene Coria RN Entered By: Carlene Coria on 04/20/2021 15:35:42 KESHAV, WINEGAR ([id number]) -------------------------------------------------------------------------------- Encounter Discharge Information Details Patient Name: [name] Date of Service: 04/20/2021 2:45 PM Medical Record Number: [id number] Patient Account Number: [id number] Date of Birth/Sex: 05-Feb-1981 (40 y.o. M) Treating RN: Carlene Coria Primary Care Sheronda Parran: [name] Other Clinician: Referring Jadence Kinlaw: [name] Treating Zaire Vanbuskirk/Extender: [name] Cliche in Treatment: 228 Encounter Discharge Information Items Post Procedure Vitals Discharge Condition: Stable Temperature (F): 98.3 Ambulatory Status: Ambulatory Pulse (bpm): 130 Discharge Destination: Home Respiratory Rate (breaths/min): 18 Transportation: Private Auto Blood Pressure (mmHg): 165/84 Accompanied By: self Schedule Follow-up Appointment: Yes Clinical Summary of Care: Patient Declined Electronic Signature(s) Signed: 04/20/2021 4:25:56 PM By: Carlene Coria RN Entered By: Carlene Coria on 04/20/2021 15:41:55 [name] ([id number]) -------------------------------------------------------------------------------- Lower Extremity Assessment Details Patient Name: [name] Date of Service: 04/20/2021 2:45 PM Medical Record Number: [id number] Patient Account Number: [id number] Date of Birth/Sex: [birth year] (40 y.o. M) Treating RN: Carlene Coria Primary Care Arisbel Maione: [name] Other Clinician: Referring Salah Burlison: [name] Treating Chanee Henrickson/Extender: [name] Cliche in Treatment: 228 Vascular Assessment Pulses: Dorsalis Pedis Palpable:  [Left:Yes] Electronic Signature(s) Signed: 04/20/2021 4:25:56 PM By: Carlene Coria RN Entered By: Carlene Coria on 04/20/2021 15:32:23 MARTAVION, COUPER ([id number]) -------------------------------------------------------------------------------- Multi Wound Chart Details Patient Name: [name] Date of Service: 04/20/2021 2:45 PM Medical Record Number: [id number] Patient Account Number: [id number] Date of Birth/Sex: Aug 11, 1980 (40 y.o. M) Treating RN: Carlene Coria Primary Care Krupa Stege: [name] Other Clinician: Referring Zarif Rathje: [name] Treating Viriginia Amendola/Extender: [name] Cliche in Treatment: 228 Vital Signs Height(in): 71 Pulse(bpm): 130 Weight(lbs): 338 Blood Pressure(mmHg): 165/84 Body Mass Index(BMI): 47 Temperature(F): 98.3 Respiratory Rate(breaths/min): 18 Photos: [N/A:N/A] Wound Location: Left, Lateral Lower Leg N/A N/A Wounding Event: Gradually Appeared N/A N/A Primary Etiology: Pyoderma N/A N/A Comorbid History: Sleep Apnea, Hypertension, Colitis N/A N/A Date Acquired: 11/18/2015 N/A N/A Weeks of Treatment: 228 N/A N/A Wound Status: Open N/A N/A Measurements L x W x D (cm) 5x2x0.4 N/A N/A Area (cm) : 7.854 N/A N/A Volume (cm) : 3.142 N/A N/A % Reduction in Area: -60.00% N/A N/A % Reduction in Volume: 20.00% N/A N/A Classification: Full Thickness With Exposed N/A N/A Support Structures Exudate Amount: Small N/A N/A Exudate Type: Serosanguineous N/A N/A Exudate Color: red, brown N/A N/A Wound Margin: Thickened N/A N/A Granulation Amount: Large (67-100%) N/A N/A Granulation Quality: Red, Hyper-granulation N/A N/A Necrotic Amount: None Present (0%) N/A N/A Exposed Structures: Fat Layer (Subcutaneous Tissue): N/A N/A Yes Muscle: No Epithelialization: Small (1-33%) N/A N/A Treatment Notes Electronic Signature(s) Signed: 04/20/2021 4:25:56 PM By: Carlene Coria RN Entered By: Carlene Coria on 04/20/2021 15:32:58 [name]  ([id number]) -------------------------------------------------------------------------------- [place] Details Patient Name: [name] Date of Service: 04/20/2021 2:45 PM Medical Record Number: [id number] Patient Account Number: [id number] Date of Birth/Sex: 1980/10/11 (40 y.o. M) Treating RN: Carlene Coria Primary Care Natash Berman: [name] Other Clinician: Referring [name] [name]: [name] Treating Suzanne Garbers/Extender: [name] Cliche in Treatment: 228 oo Multidisciplinary Care Plan reviewed with physician Active Inactive Necrotic Tissue Nursing Diagnoses: Impaired tissue integrity related to necrotic/devitalized tissue Knowledge deficit related to management of necrotic/devitalized tissue Goals: Necrotic/devitalized tissue will be minimized in the wound bed Date Initiated: 03/26/2021 Target Resolution Date: 03/26/2021 Goal Status: Active Patient/caregiver will verbalize understanding of reason and process for debridement of necrotic tissue Date Initiated: 03/26/2021 Target Resolution Date: 03/26/2021 Goal Status: Active Interventions: Assess patient pain  level pre-, during and post procedure and prior to discharge Provide education on necrotic tissue and debridement process Treatment Activities: Apply topical anesthetic as ordered : 03/26/2021 Notes: Electronic Signature(s) Signed: 04/20/2021 4:25:56 PM By: Carlene Coria RN Entered By: Carlene Coria on 04/20/2021 15:32:37 Kugel, [name] ([id number]) -------------------------------------------------------------------------------- Pain Assessment Details Patient Name: [name] Date of Service: 04/20/2021 2:45 PM Medical Record Number: [id number] Patient Account Number: [id number] Date of Birth/Sex: 08-07-1980 (40 y.o. M) Treating RN: Carlene Coria Primary Care Helga Asbury: [name] Other Clinician: Referring Katana Berthold: [name] Treating Lera Gaines/Extender: [name] Cliche in Treatment:  228 Active Problems Location of Pain Severity and Description of Pain Patient Has Paino No Site Locations Pain Management and Medication Current Pain Management: Electronic Signature(s) Signed: 04/20/2021 4:25:56 PM By: Carlene Coria RN Entered By: Carlene Coria on 04/20/2021 14:56:14 [name] ([id number]) -------------------------------------------------------------------------------- Patient/Caregiver Education Details Patient Name: [name] Date of Service: 04/20/2021 2:45 PM Medical Record Number: [id number] Patient Account Number: [id number] Date of Birth/Gender: 05-Jan-1981 (40 y.o. M) Treating RN: Carlene Coria Primary Care Physician: [name] Other Clinician: Referring Physician: [name] Treating Physician/Extender: [name] Cliche in Treatment: 228 Education Assessment Education Provided To: Patient Education Topics Provided Wound Debridement: Methods: Explain/Verbal Responses: State content correctly Electronic Signature(s) Signed: 04/20/2021 4:25:56 PM By: Carlene Coria RN Entered By: Carlene Coria on 04/20/2021 15:40:26 [name] ([id number]) -------------------------------------------------------------------------------- Wound Assessment Details Patient Name: [name] Date of Service: 04/20/2021 2:45 PM Medical Record Number: [id number] Patient Account Number: [id number] Date of Birth/Sex: [birth year] (40 y.o. M) Treating RN: Carlene Coria Primary Care Aeva Posey: [name] Other Clinician: Referring Cutler Sunday: [name] Treating Yarel Rushlow/Extender: [name] Cliche in Treatment: 228 Wound Status Wound Number: 1 Primary Etiology: Pyoderma Wound Location: Left, Lateral Lower Leg Wound Status: Open Wounding Event: Gradually Appeared Comorbid History: Sleep Apnea, Hypertension, Colitis Date Acquired: 11/18/2015 Weeks Of Treatment: 228 Clustered Wound: No Photos Wound Measurements Length: (cm) 5 Width: (cm) 2 Depth: (cm)  0.4 Area: (cm) 7.854 Volume: (cm) 3.142 % Reduction in Area: -60% % Reduction in Volume: 20% Epithelialization: Small (1-33%) Tunneling: No Undermining: No Wound Description Classification: Full Thickness With Exposed Support Structures Wound Margin: Thickened Exudate Amount: Small Exudate Type: Serosanguineous Exudate Color: red, brown Foul Odor After Cleansing: No Slough/Fibrino No Wound Bed Granulation Amount: Large (67-100%) Exposed Structure Granulation Quality: Red, Hyper-granulation Fat Layer (Subcutaneous Tissue) Exposed: Yes Necrotic Amount: None Present (0%) Muscle Exposed: No Treatment Notes Wound #1 (Lower Leg) Wound Laterality: Left, Lateral Cleanser Wound Cleanser Discharge Instruction: Wash your hands with soap and water. Remove old dressing, discard into plastic bag and place into trash. Cleanse the wound with Wound Cleanser prior to applying a clean dressing using gauze sponges, not tissues or cotton balls. Do not scrub or use excessive force. Pat dry using gauze sponges, not tissue or cotton balls. Peri-Wound Care Moisturizing CLIFTON, SAFLEY ([id number]) Discharge Instruction: Suggestions: Theraderm, Eucerin, Cetaphil, or patient preference. Topical Primary Dressing Secondary Dressing Xtrasorb Medium 4x5 (in/in) Discharge Instruction: Apply to wound as directed. Do not cut. Secured With Compression Wrap Medichoice 4 layer Compression System, 35-40 mmHG Discharge Instruction: Apply multi-layer wrap as directed. Compression Stockings Add-Ons Electronic Signature(s) Signed: 04/20/2021 4:25:56 PM By: Carlene Coria RN Entered By: Carlene Coria on 04/20/2021 15:03:40 [name] ([id number]) -------------------------------------------------------------------------------- Vitals Details Patient Name: [name] Date of Service: 04/20/2021 2:45 PM Medical Record Number: [id number] Patient Account Number: [id number] Date of Birth/Sex: 01-Nov-1980 (40  y.o. M) Treating RN: Carlene Coria Primary Care Jarissa Sheriff: [name] Other Clinician: Referring  Jayvion Stefanski: [name] Treating Yazen Rosko/Extender: [name] Cliche in Treatment: 228 Vital Signs Time Taken: 14:55 Temperature (F): 98.3 Height (in): 71 Pulse (bpm): 130 Weight (lbs): 338 Respiratory Rate (breaths/min): 18 Body Mass Index (BMI): 47.1 Blood Pressure (mmHg): 165/84 Reference Range: 80 - 120 mg / dl Electronic Signature(s) Signed: 04/20/2021 4:25:56 PM By: Carlene Coria RN Entered By: Carlene Coria on 04/20/2021 14:56:06

## 2021-04-23 ENCOUNTER — Other Ambulatory Visit: Payer: Self-pay

## 2021-04-23 DIAGNOSIS — E11622 Type 2 diabetes mellitus with other skin ulcer: Secondary | ICD-10-CM | POA: Diagnosis not present

## 2021-04-23 NOTE — Progress Notes (Signed)
[name], [name] ([id number]) Visit Report for 04/23/2021 Arrival Information Details Patient Name: [name], [name] Date of Service: 04/23/2021 8:15 AM Medical Record Number: [id number] Patient Account Number: [id number] Date of Birth/Sex: 1980-07-08 (40 y.o. Male) Treating RN: [name] Primary Care Joya Willmott: [name] Other Clinician: Referring Jillianne Gamino: [name] Treating Lanyla Costello/Extender: [name] Cliche in Treatment: 39 Visit Information History Since Last Visit Added or deleted any medications: No Patient Arrived: Ambulatory Pain Present Now: No Arrival Time: 08:25 Transfer Assistance: None Patient Requires Transmission-Based No Precautions: Patient Has Alerts: Yes Patient Alerts: Patient has reaction to silver dressings. Electronic Signature(s) Signed: 04/23/2021 5:23:39 PM By: [name], [name], RN, [name], [name], [name] Entered By: [name], [name], RN, [name], [name] on 04/23/2021 08:48:00 [name] ([id number]) -------------------------------------------------------------------------------- Compression Therapy Details Patient Name: [name] Date of Service: 04/23/2021 8:15 AM Medical Record Number: [id number] Patient Account Number: [id number] Date of Birth/Sex: 1981-01-14 (40 y.o. Male) Treating RN: [name] Primary Care Darnel Mchan: [name] Other Clinician: Referring Teniyah Seivert: [name] Treating Hadlea Furuya/Extender: [name] Cliche in Treatment: 228 Compression Therapy Performed for Wound Assessment: Wound #1 Left,Lateral Lower Leg Performed By: Clinician [name], RN Compression Type: Four Layer Pre Treatment ABI: 1.2 Electronic Signature(s) Signed: 04/23/2021 5:23:39 PM By: [name], [name], RN, [name], [name], [name] Entered By: [name], [name], RN, [name], [name] on 04/23/2021 08:49:26 [name] ([id number]) -------------------------------------------------------------------------------- Encounter Discharge Information Details Patient Name: [name] Date of  Service: 04/23/2021 8:15 AM Medical Record Number: [id number] Patient Account Number: [id number] Date of Birth/Sex: [birth year] (40 y.o. Male) Treating RN: [name] Primary Care Jaryn Hocutt: [name] Other Clinician: Referring Geremy Rister: [name] Treating Melody Cirrincione/Extender: [name] Cliche in Treatment: 228 Encounter Discharge Information Items Discharge Condition: Stable Ambulatory Status: Ambulatory Discharge Destination: Home Transportation: Private Auto Accompanied By: self Schedule Follow-up Appointment: Yes Clinical Summary of Care: Electronic Signature(s) Signed: 04/23/2021 5:23:39 PM By: [name], [name], RN, [name], [name], [name] Entered By: [name], [name], RN, [name], [name] on 04/23/2021 08:50:00 [name] ([id number]) -------------------------------------------------------------------------------- Wound Assessment Details Patient Name: [name] Date of Service: 04/23/2021 8:15 AM Medical Record Number: [id number] Patient Account Number: [id number] Date of Birth/Sex: 1980/10/13 (40 y.o. Male) Treating RN: [name] Primary Care Haydin Calandra: [name] Other Clinician: Referring Mohit [name]: [name] Treating Joeph Szatkowski/Extender: [name] Cliche in Treatment: 228 Wound Status Wound Number: 1 Primary Etiology: Pyoderma Wound Location: Left, Lateral Lower Leg Wound Status: Open Wounding Event: Gradually Appeared Comorbid History: Sleep Apnea, Hypertension, Colitis Date Acquired: 11/18/2015 Weeks Of Treatment: 228 Clustered Wound: No Wound Measurements Length: (cm) 5 Width: (cm) 2 Depth: (cm) 0.4 Area: (cm) 7.854 Volume: (cm) 3.142 % Reduction in Area: -60% % Reduction in Volume: 20% Epithelialization: Small (1-33%) Wound Description Classification: Full Thickness With Exposed Support Structures Wound Margin: Thickened Exudate Amount: Medium Exudate Type: Serosanguineous Exudate Color: red, brown Foul Odor After Cleansing: No Slough/Fibrino No Wound  Bed Granulation Amount: Large (67-100%) Exposed Structure Granulation Quality: Red, Hyper-granulation Fat Layer (Subcutaneous Tissue) Exposed: Yes Necrotic Amount: None Present (0%) Muscle Exposed: No Assessment Notes Apligraf applied at last visit. Patient here for re-wrap only. Treatment Notes Wound #1 (Lower Leg) Wound Laterality: Left, Lateral Cleanser Wound Cleanser Discharge Instruction: Wash your hands with soap and water. Remove old dressing, discard into plastic bag and place into trash. Cleanse the wound with Wound Cleanser prior to applying a clean dressing using gauze sponges, not tissues or cotton balls. Do not scrub or use excessive force. Pat dry using gauze sponges, not tissue or cotton balls. Peri-Wound Care Moisturizing Lotion  Discharge Instruction: Suggestions: Theraderm, Eucerin, Cetaphil, or patient preference. Topical Primary Dressing Secondary Dressing Xtrasorb Medium 4x5 (in/in) Discharge Instruction: Apply to wound as directed. Do not cut. Secured With Compression Wrap Medichoice 4 layer Compression System, 35-40 mmHG [name], [name] ([id number]) Discharge Instruction: Apply multi-layer wrap as directed. Compression Stockings Add-Ons Electronic Signature(s) Signed: 04/23/2021 5:23:39 PM By: [name], [name], RN, [name], [name], [name] Entered By: [name], [name], RN, [name], [name] on 04/23/2021 [phone]

## 2021-04-24 NOTE — Telephone Encounter (Signed)
Called patient set up in office on 12/13. Aware of location. Needs refill on needles sent in today

## 2021-04-24 NOTE — Telephone Encounter (Signed)
Noted. Refill(s) sent to pharmacy.

## 2021-04-25 ENCOUNTER — Other Ambulatory Visit: Payer: Self-pay

## 2021-04-25 DIAGNOSIS — E11622 Type 2 diabetes mellitus with other skin ulcer: Secondary | ICD-10-CM | POA: Diagnosis not present

## 2021-04-25 NOTE — Progress Notes (Signed)
TILMON, WISEHART ([id number]) Visit Report for 04/25/2021 Arrival Information Details Patient Name: [name], [name] Date of Service: 04/25/2021 8:00 AM Medical Record Number: [id number] Patient Account Number: [id number] Date of Birth/Sex: Jun 08, 1980 (40 y.o. M) Treating RN: Donnamarie Poag Primary Care Gissela Bloch: [name] Other Clinician: Referring Jakayden Cancio: [name] Treating Deandrae Wajda/Extender: Yaakov Guthrie in Treatment: 229 Visit Information History Since Last Visit Added or deleted any medications: No Patient Arrived: Ambulatory Had a fall or experienced change in No Arrival Time: 08:03 activities of daily living that may affect Accompanied By: self risk of falls: Transfer Assistance: None Hospitalized since last visit: No Patient Identification Verified: Yes Has Dressing in Place as Prescribed: Yes Secondary Verification Process Completed: Yes Has Compression in Place as Prescribed: Yes Patient Requires Transmission-Based No Pain Present Now: No Precautions: Patient Has Alerts: Yes Patient Alerts: Patient has reaction to silver dressings. Electronic Signature(s) Signed: 04/25/2021 4:15:55 PM By: Donnamarie Poag Entered By: Donnamarie Poag on 04/25/2021 08:21:57 [name] ([id number]) -------------------------------------------------------------------------------- Clinic Level of Care Assessment Details Patient Name: [name] Date of Service: 04/25/2021 8:00 AM Medical Record Number: [id number] Patient Account Number: [id number] Date of Birth/Sex: [birth year] (40 y.o. M) Treating RN: Donnamarie Poag Primary Care Aracely Rickett: [name] Other Clinician: Referring Leya Paige: [name] Treating Myalee Stengel/Extender: Yaakov Guthrie in Treatment: 229 Clinic Level of Care Assessment Items TOOL 1 Quantity Score []  - Use when EandM and Procedure is performed on INITIAL visit 0 ASSESSMENTS - Nursing Assessment / Reassessment []  - General Physical Exam  (combine w/ comprehensive assessment (listed just below) when performed on new 0 pt. evals) []  - 0 Comprehensive Assessment (HX, ROS, Risk Assessments, Wounds Hx, etc.) ASSESSMENTS - Wound and Skin Assessment / Reassessment []  - Dermatologic / Skin Assessment (not related to wound area) 0 ASSESSMENTS - Ostomy and/or Continence Assessment and Care []  - Incontinence Assessment and Management 0 []  - 0 Ostomy Care Assessment and Management (repouching, etc.) PROCESS - Coordination of Care []  - Simple Patient / Family Education for ongoing care 0 []  - 0 Complex (extensive) Patient / Family Education for ongoing care []  - 0 Staff obtains Programmer, systems, Records, Test Results / Process Orders []  - 0 Staff telephones HHA, Nursing Homes / Clarify orders / etc []  - 0 Routine Transfer to another Facility (non-emergent condition) []  - 0 Routine Hospital Admission (non-emergent condition) []  - 0 New Admissions / Biomedical engineer / Ordering NPWT, Apligraf, etc. []  - 0 Emergency Hospital Admission (emergent condition) PROCESS - Special Needs []  - Pediatric / Minor Patient Management 0 []  - 0 Isolation Patient Management []  - 0 Hearing / Language / Visual special needs []  - 0 Assessment of Community assistance (transportation, D/C planning, etc.) []  - 0 Additional assistance / Altered mentation []  - 0 Support Surface(s) Assessment (bed, cushion, seat, etc.) INTERVENTIONS - Miscellaneous []  - External ear exam 0 []  - 0 Patient Transfer (multiple staff / Civil Service fast streamer / Similar devices) []  - 0 Simple Staple / Suture removal (25 or less) []  - 0 Complex Staple / Suture removal (26 or more) []  - 0 Hypo/Hyperglycemic Management (do not check if billed separately) []  - 0 Ankle / Brachial Index (ABI) - do not check if billed separately Has the patient been seen at the hospital within the last three years: Yes Total Score: 0 Level Of Care: ____ [name] ([id number]) Electronic  Signature(s) Signed: 04/25/2021 4:15:55 PM By: Donnamarie Poag Entered By: Donnamarie Poag on 04/25/2021 08:25:12 [name], [name] ([id number]) -------------------------------------------------------------------------------- Compression Therapy Details Patient Name: [name]  Date of Service: 04/25/2021 8:00 AM Medical Record Number: [id number] Patient Account Number: [id number] Date of Birth/Sex: 1980-09-23 (40 y.o. M) Treating RN: Donnamarie Poag Primary Care Grey Schlauch: [name] Other Clinician: Referring Arika Mainer: [name] Treating Koree Staheli/Extender: Yaakov Guthrie in Treatment: 229 Compression Therapy Performed for Wound Assessment: Wound #1 Left,Lateral Lower Leg Performed By: Junius Argyle, RN Compression Type: Four Layer Electronic Signature(s) Signed: 04/25/2021 4:15:55 PM By: Donnamarie Poag Entered By: Donnamarie Poag on 04/25/2021 08:22:45 [name] ([id number]) -------------------------------------------------------------------------------- Encounter Discharge Information Details Patient Name: [name] Date of Service: 04/25/2021 8:00 AM Medical Record Number: [id number] Patient Account Number: [id number] Date of Birth/Sex: 09/26/1980 (40 y.o. M) Treating RN: Donnamarie Poag Primary Care Maureen Duesing: [name] Other Clinician: Referring Lyriq Finerty: [name] Treating Lavonya Hoerner/Extender: Yaakov Guthrie in Treatment: 229 Encounter Discharge Information Items Discharge Condition: Stable Ambulatory Status: Ambulatory Discharge Destination: Home Transportation: Private Auto Accompanied By: self Schedule Follow-up Appointment: Yes Clinical Summary of Care: Electronic Signature(s) Signed: 04/25/2021 4:15:55 PM By: Donnamarie Poag Entered By: Donnamarie Poag on 04/25/2021 08:25:01 [name] ([id number]) -------------------------------------------------------------------------------- Wound Assessment Details Patient Name: [name] Date of  Service: 04/25/2021 8:00 AM Medical Record Number: [id number] Patient Account Number: [id number] Date of Birth/Sex: 1980/07/18 (40 y.o. M) Treating RN: Donnamarie Poag Primary Care Manfred Laspina: [name] Other Clinician: Referring Ayoub Arey: [name] Treating Caro Brundidge/Extender: Yaakov Guthrie in Treatment: 229 Wound Status Wound Number: 1 Primary Etiology: Pyoderma Wound Location: Left, Lateral Lower Leg Wound Status: Open Wounding Event: Gradually Appeared Comorbid History: Sleep Apnea, Hypertension, Colitis Date Acquired: 11/18/2015 Weeks Of Treatment: 229 Clustered Wound: No Wound Measurements Length: (cm) 5 Width: (cm) 2 Depth: (cm) 0.4 Area: (cm) 7.854 Volume: (cm) 3.142 % Reduction in Area: -60% % Reduction in Volume: 20% Epithelialization: Small (1-33%) Wound Description Classification: Full Thickness With Exposed Support Structures Wound Margin: Thickened Exudate Amount: Medium Exudate Type: Serosanguineous Exudate Color: red, brown Foul Odor After Cleansing: No Slough/Fibrino No Wound Bed Granulation Amount: Large (67-100%) Exposed Structure Granulation Quality: Red, Hyper-granulation Fat Layer (Subcutaneous Tissue) Exposed: Yes Necrotic Amount: None Present (0%) Muscle Exposed: No Treatment Notes Wound #1 (Lower Leg) Wound Laterality: Left, Lateral Cleanser Wound Cleanser Discharge Instruction: Wash your hands with soap and water. Remove old dressing, discard into plastic bag and place into trash. Cleanse the wound with Wound Cleanser prior to applying a clean dressing using gauze sponges, not tissues or cotton balls. Do not scrub or use excessive force. Pat dry using gauze sponges, not tissue or cotton balls. Peri-Wound Care Moisturizing Lotion Discharge Instruction: Suggestions: Theraderm, Eucerin, Cetaphil, or patient preference. Topical Primary Dressing Secondary Dressing Xtrasorb Medium 4x5 (in/in) Discharge Instruction: Apply to wound  as directed. Do not cut. Secured With Compression Wrap Medichoice 4 layer Compression System, 35-40 mmHG Discharge Instruction: Apply multi-layer wrap as directed. [name], [name] ([id number]) Compression Stockings Add-Ons Electronic Signature(s) Signed: 04/25/2021 4:15:55 PM By: Donnamarie Poag Entered ByDonnamarie Poag on 04/25/2021 08:22:20

## 2021-04-25 NOTE — Progress Notes (Signed)
TREVIONE, WERT ([id number]) Visit Report for 04/25/2021 Physician Orders Details Patient Name: [name], [name] Date of Service: 04/25/2021 8:00 AM Medical Record Number: [id number] Patient Account Number: [id number] Date of Birth/Sex: 1980/11/30 (40 y.o. M) Treating RN: Donnamarie Poag Primary Care Provider: [name] Other Clinician: Referring Provider: [name] Treating Provider/Extender: Yaakov Guthrie in Treatment: [phone] Verbal / Phone Orders: No Diagnosis Coding Follow-up Appointments o Return Appointment in 1 week. o Nurse Visit as needed - twice a week Bathing/ Shower/ Hygiene o Clean wound with Normal Saline or wound cleanser. Cellular or Tissue Based Products o Cellular or Tissue Based Product Type: - APLIGRAF #1 o Cellular or Tissue Based Product applied to wound bed; including contact layer, fixation with steri-strips, dry gauze and cover dressing. (DO NOT REMOVE). Edema Control - Lymphedema / Segmental Compressive Device / Other o Optional: One layer of unna paste to top of compression wrap (to act as an anchor). o Elevate, Exercise Daily and Avoid Standing for Long Periods of Time. o Elevate legs to the level of the heart and pump ankles as often as possible o Elevate leg(s) parallel to the floor when sitting. Wound Treatment Wound #1 - Lower Leg Wound Laterality: Left, Lateral Cleanser: Wound Cleanser 3 x Per Week/30 Days Discharge Instructions: Wash your hands with soap and water. Remove old dressing, discard into plastic bag and place into trash. Cleanse the wound with Wound Cleanser prior to applying a clean dressing using gauze sponges, not tissues or cotton balls. Do not scrub or use excessive force. Pat dry using gauze sponges, not tissue or cotton balls. Peri-Wound Care: Moisturizing Lotion 3 x Per Week/30 Days Discharge Instructions: Suggestions: Theraderm, Eucerin, Cetaphil, or patient preference. Secondary Dressing: Xtrasorb Medium  4x5 (in/in) 3 x Per Week/30 Days Discharge Instructions: Apply to wound as directed. Do not cut. Compression Wrap: Medichoice 4 layer Compression System, 35-40 mmHG (Generic) 3 x Per Week/30 Days Discharge Instructions: Apply multi-layer wrap as directed. Electronic Signature(s) Signed: 04/25/2021 11:47:32 AM By: Kalman Shan DO Signed: 04/25/2021 4:15:55 PM By: Donnamarie Poag Entered By: Donnamarie Poag on 04/25/2021 08:23:24 [name] ([id number]) -------------------------------------------------------------------------------- [place] Details Patient Name: [name] Date of Service: 04/25/2021 Medical Record Number: [id number] Patient Account Number: [id number] Date of Birth/Sex: [birth year] (40 y.o. 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

## 2021-04-27 ENCOUNTER — Other Ambulatory Visit: Payer: Self-pay

## 2021-04-27 ENCOUNTER — Encounter: Payer: 59 | Admitting: Physician Assistant

## 2021-04-27 DIAGNOSIS — E11622 Type 2 diabetes mellitus with other skin ulcer: Secondary | ICD-10-CM | POA: Diagnosis not present

## 2021-04-27 NOTE — Progress Notes (Addendum)
[name], [name] ([id number]) Visit Report for 04/27/2021 Chief Complaint Document Details Patient Name: [name], [name] Date of Service: 04/27/2021 8:00 AM Medical Record Number: [id number] Patient Account Number: [id number] Date of Birth/Sex: 03/23/1981 (40 y.o. 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 M) Treating RN: Carlene Coria Primary Care Provider: [name] Other Clinician: Referring Provider: [name] Treating Provider/Extender: [name] Cliche in Treatment: 229 Cellular or Tissue Based Product Type Wound #1 Left,Lateral Lower Leg Applied to: Performed By: Physician Tommie Sams., PA-C Cellular or Tissue Based Product Apligraf Type: Level of Consciousness (Pre- Awake and Alert procedure): Pre-procedure Verification/Time Out Yes - 08:27 Taken: Location: trunk / arms / legs Wound Size (sq cm): 8.6 Product Size (sq cm): 44 Waste Size (sq cm): 22 Waste Reason: wound size Amount of Product Applied (sq cm): 22 Instrument Used: Blade, Forceps, Scissors Lot #: [id number] Order #: 2 Expiration Date: 05/09/2021 Fenestrated: Yes Instrument: Blade Reconstituted: Yes Solution Type: normal saline Solution Amount: 10 ml Lot #: [id number] Solution Expiration Date:  10/19/2022 Secured: Yes Secured With: Steri-Strips Dressing Applied: Yes Primary Dressing: adaptic Procedural Pain: 0 Post Procedural Pain: 0 Response to Treatment: Procedure was tolerated well Level of Consciousness (Post- Awake and Alert procedure): Post Procedure Diagnosis Same as Pre-procedure Electronic Signature(s) Signed: 04/30/2021 12:20:53 PM By: Carlene Coria RN Entered By: Carlene Coria on 04/27/2021 08:30:19 [name] ([id number]) -------------------------------------------------------------------------------- HPI Details Patient Name: [name] Date of Service: 04/27/2021 8:00 AM Medical Record Number: [id number] Patient Account Number: [id number] Date of Birth/Sex: Oct 19, 1980 (40 y.o. M) Treating RN: Carlene Coria Primary Care Provider: [name] Other Clinician: Referring Provider: [name] Treating Provider/Extender: [name] Cliche in Treatment: 229 History of Present Illness HPI Description: 12/04/16; 40 year old man who comes into the clinic today for review of a wound on the posterior left calf. He tells me that is been there for about a year. He is not a diabetic he does smoke half a pack per day. He was seen in the ER on 11/20/16 felt to have cellulitis around the wound and was given clindamycin. An x-ray did not show osteomyelitis. The patient initially tells me that he has a milk allergy that sets off a pruritic itching rash on his lower legs which she scratches incessantly and he thinks that's what may have set up the wound. He has been using various topical antibiotics and ointments without any effect. He works in a [employer] and is on his feet all day. He does not have a prior history of wounds however he does have the rash on both lower legs the right arm and the ventral aspect of his left arm. These are excoriations and clearly have had scratching however there are of macular looking areas on both legs including a substantial larger area  on the right leg. This does not have an underlying open area. There is no blistering. The patient tells me that 2 years ago in [place] in response to the rash on his legs he saw a  dermatologist who told him he had a condition which may be pyoderma gangrenosum although I may be putting words into his mouth. He seemed to recognize this. On further questioning he admits to a 5 year history of quiesced. ulcerative colitis. He is not in any treatment for this. He's had no recent travel 12/11/16; the patient arrives today with his wound and roughly the same condition we've been using silver alginate this is a deep punched out wound with some surrounding erythema but no tenderness. Biopsy I did did not show confirmed pyoderma gangrenosum suggested nonspecific inflammation and vasculitis but does not provide an actual description of what was seen by the pathologist. I'm really not able to understand this We have also received information from the patient's dermatologist in [place] notes from April 2016. This was a doctor Agarwal-antal. 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 He was prescribed topical steroid high potency under occlusion which helped but at this point the patient did not have a deep punched out wound. 12/18/16; the patient's wound is larger in terms of surface area however this surface looks better and there is less depth. The surrounding erythema also is better. The patient states that the wrap we put on came off 2 days ago when he has been using his compression stockings. He we are in the process of getting a dermatology consult. 12/26/16 on evaluation today patient's left lower extremity wound shows evidence of infection with surrounding erythema noted. He has been tolerating the dressing changes but states that he has noted more discomfort. There is a larger area of erythema surrounding the wound. No fevers, chills, nausea, or vomiting noted at this time. With that being said the  wound still does have slough covering the surface. He is not allergic to any medication that he is aware of at this point. In regard to his right lower extremity he had several regions that are erythematous and pruritic he wonders if there's anything we can do to help that. 01/02/17 I reviewed patient's wound culture which was obtained his visit last week. He was placed on doxycycline at that point. Unfortunately that does not appear to be an antibiotic that would likely help with the situation however the pseudomonas noted on culture is sensitive to Cipro. Also unfortunately patient's wound seems to have a large compared to last week's evaluation. Not severely so but there are definitely increased measurements in general. He is continuing to have discomfort as well he writes this to be a seven out of 10. In fact he would prefer me not to perform any debridement today due to the fact that he is having discomfort and considering he has an active infection on the little reluctant to do so anyway. No fevers, chills, nausea, or vomiting noted at this time. 01/08/17; patient seems dermatology on September 5. I suspect dermatology will want the slides from the biopsy I did sent to their pathologist. I'm not sure if there is a way we can expedite that. In any case the culture I did before I left on vacation 3 weeks ago showed Pseudomonas he was given 10 days of Cipro and per her description of her intake nurses is actually somewhat better this week although the wound is quite a bit bigger than I remember the last time I saw this. He still has 3 more days of Cipro 01/21/17; dermatology appointment tomorrow. He has completed the ciprofloxacin for Pseudomonas. Surface of the wound looks better however he is had some deterioration  in the lesions on his right leg. Meantime the left lateral leg wound we will continue with sample 01/29/17; patient had his dermatology appointment but I can't yet see that note. He is  completed his antibiotics. The wound is more superficial but considerably larger in circumferential area than when he came in. This is in his left lateral calf. He also has swollen erythematous areas with superficial wounds on the right leg and small papular areas on both arms. There apparently areas in her his upper thighs and buttocks I did not look at those. Dermatology biopsied the right leg. Hopefully will have their input next week. 02/05/17; patient went back to see his dermatologist who told him that he had a "scratching problem" as well as staph. He is now on a 30 day course of doxycycline and I believe she gave him triamcinolone cream to the right leg areas to help with the itching [not exactly sure but probably triamcinolone]. She apparently looked at the left lateral leg wound although this was not rebiopsied and I think felt to be ultimately part of the same pathogenesis. He is using sample border foam and changing nevus himself. He now has a new open area on the right posterior leg which was his biopsy site I don't have any of the dermatology notes 02/12/17; we put the patient in compression last week with SANTYL to the wound on the left leg and the biopsy. Edema is much better and the depth of the wound is now at level of skin. Area is still the same oBiopsy site on the right lateral leg we've also been using santyl with a border foam dressing and he is changing this himself. 02/19/17; Using silver alginate started last week to both the substantial left leg wound and the biopsy site on the right wound. He is tolerating compression well. Has a an appointment with his primary M.D. tomorrow wondering about diuretics although I'm wondering if the edema problem is actually lymphedema 02/26/17; the patient has been to see his primary doctor Dr. Jerrel Ivory at [facility] our primary care. She started him on Lasix 20 mg and this seems to have helped with the edema. However we are not making  substantial change with the left lateral calf wound and inflammation. The biopsy site on the right leg also looks stable but not really all that different. 03/12/17; the patient has been to see vein and vascular Dr. Lucky Cowboy. He has had venous reflux studies I have not reviewed these. I did get a call from his dermatology office. They felt that he might have pathergy based on their biopsy on his right leg which led them to look at the slides of [name], [name] ([id number]) the biopsy I did on the left leg and they wonder whether this represents pyoderma gangrenosum which was the original supposition in a man with ulcerative colitis albeit inactive for many years. They therefore recommended clobetasol and tetracycline i.e. aggressive treatment for possible pyoderma gangrenosum. 03/26/17; apparently the patient just had reflux studies not an appointment with Dr. dew. She arrives in clinic today having applied clobetasol for 2-3 weeks. He notes over the last 2-3 days excessive drainage having to change the dressing 3-4 times a day and also expanding erythema. He states the expanding erythema seems to come and go and was last this red was earlier in the month.he is on doxycycline 150 mg twice a day as an anti-inflammatory systemic therapy for possible pyoderma gangrenosum along with the topical clobetasol 04/02/17; the patient  was seen last week by Dr. Lillia Carmel at [facility] dermatology locally who kindly saw him at my request. 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 My concern was the degree of erythema medially extending from his left leg wound which was either inflammation from pyoderma or cellulitis. I put him on Augmentin however culture of the wound showed Pseudomonas which is quinolone sensitive. I really don't believe he has cellulitis however in view of everything I will continue and give him a course of Cipro. He is also on doxycycline as an  immune modulator for the pyoderma. In addition to his original wound on the left lateral leg with surrounding erythema he has a wound on the right posterior calf which was an original biopsy site done by dermatology. 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 Saw Dr. Lillia Carmel yesterday. He has been using topical antibiotics to both wound areas his original wound on the left and the biopsies/pathergy area on the right. There is definitely some improvement in the inflammation around the wound on the right although the patient states he has increasing sensitivity of the wounds. He is on prednisone 60 and doxycycline 1 as prescribed by Dr. Lillia Carmel. He is covering the topical antibiotic with gauze and putting this in his own compression stocks and changing this daily. He states that Dr. Lottie Rater did a culture of the left leg wound yesterday 05/07/17; pyoderma gangrenosum. The patient saw Dr. Lillia Carmel yesterday and has a follow-up with her in one month. He is still using topical antibiotics to both wounds although he can't recall exactly what type. He is still on prednisone 60 mg. Dr. Lillia Carmel stated that the doxycycline could stop if we were in agreement. 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 The right medial leg was induced by biopsy/pathergy. The area on the right is essentially healed. 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 The right medial leg has closed and remains closed. He is still on prednisone 60. oHe tells me he missed his last dermatology appointment with Dr. Lillia Carmel but will make another appointment. He reports that her blood sugar at a recent screen in [place] was high 200's. He was 180 today. 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 I think he is going to require still much longer prednisone perhaps another 3 months before attempting to taper. In the meantime his wound is a lot better. Smaller. He is cleaning this off daily and applying topical antibiotics. When he was last in the clinic I thought about changing to [facility] and actually put in a couple of calls to dermatology although probably not during their business hours. 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 He continues on oral prednisone. He has been using triple antibiotic ointment. There is surface debris and we will transition to [facility] and have him return in 2 weeks. He has lost 30 pounds since his last appointment with lifestyle modification. He may benefit from topical steroid cream for treatment this can be considered at a later date. 08/22/17 on evaluation today patient appears to actually be doing rather well in regard  to his left lateral lower extremity ulcer. He has actually been managed by Dr. Dellia Nims most recently. Patient is currently on oral steroids at this time. This seems to have been of benefit for him. Nonetheless his last visit was actually with Leah on 08/06/17. Currently he is not utilizing any topical steroid creams although this could be of benefit as well. No fevers, chills, nausea, or vomiting noted at this time. 09/05/17 on evaluation today patient appears to be doing better in regard to his left lateral lower extremity ulcer. He has been tolerating the dressing changes without complication. He is using Santyl with good effect. Overall I'm very pleased with how things are standing at this point. Patient likewise is happy that this is doing better. 09/19/17 on evaluation today patient actually appears to be doing rather well in regard to his left lateral lower extremity ulcer. Again this  is secondary to Pyoderma gangrenosum and he seems to be progressing well with the Santyl which is good news. He's not having any significant pain. 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 He has been tolerating the Santyl without complication and in general I feel like he's making good progress. 10/17/17 on evaluation today patient appears to be doing very well in regard to his left lateral lower surety ulcer. He has been tolerating the dressing changes without complication. There does not appear to be any evidence of infection he's alternating the Santyl and the triple antibiotic ointment every other day this seems to be doing well for him. 11/03/17 on evaluation today patient appears to be doing very well in regard to his left lateral lower extremity ulcer. He is been tolerating the dressing changes without complication which is good news. Fortunately there does not appear to be any evidence of infection which is also great news. Overall is doing excellent they are starting to taper down on the prednisone is down to 40 mg at this point it also started topical clobetasol for him. 11/17/17 on evaluation today patient appears to be doing well in regard to his left lateral lower surety ulcer. He's been tolerating the dressing changes without complication. He does note that he is having no pain, no excessive drainage or discharge, and overall he feels like things are going about how he would expect and hope they would. Overall he seems to have no evidence of infection at this time in my opinion which is good news. 12/04/17-He is seen in follow-up evaluation for right lateral lower extremity ulcer. He has been applying topical steroid cream. Today's measurement show slight increase in size. Over the next 2 weeks we will transition to every other day Santyl and steroid cream. He has been encouraged  to monitor for changes and notify clinic with any concerns 12/15/17 on evaluation today patient's left lateral motion the ulcer and fortunately is doing worse again at this point. This just since last week to this week has close to doubled in size according to the patient. I did not seeing last week's I do not have a visual to compare this to in our system was also down so we do not have all the charts and at this point. Nonetheless it does have me somewhat concerned in regard to the fact that again he was worried enough about it he has contact the dermatology that placed them back on the full strength, 50 mg a day of the prednisone  that he was taken previous. He continues to alternate using clobetasol along with Santyl at this point. He is obviously somewhat frustrated. 12/22/17 on evaluation today patient appears to be doing a little worse compared to last evaluation. Unfortunately the wound is a little deeper and slightly larger than the last week's evaluation. With that being said he has made some progress in regard to the irritation surrounding at this time unfortunately despite that progress that's been made he still has a significant issue going on here. 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 [name], [name] ([id number]) For that reason I discussed with him today the possibility of perform a wound culture to ensure there's nothing overtly infected. 01/06/18 on evaluation today patient's wound is larger and deeper than previously evaluated. With that being said it did appear that his wound was infected after my last evaluation with him. Subsequently I did end up prescribing a prescription for Bactrim DS which she has been taking and having no complication with. Fortunately there does not appear to be any evidence of infection at this point in time as  far as anything spreading, no want to touch, and overall I feel like things are showing signs of improvement. 01/13/18 on evaluation today patient appears to be even a little larger and deeper than last time. There still muscle exposed in the base of the wound. Nonetheless he does appear to be less erythematous I do believe inflammation is calming down also believe the infection looks like it's probably resolved at this time based on what I'm seeing. No fevers, chills, nausea, or vomiting noted at this time. 01/30/18 on evaluation today patient actually appears to visually look better for the most part. Unfortunately those visually this looks better he does seem to potentially have what may be an abscess in the muscle that has been noted in the central portion of the wound. This is the first time that I have noted what appears to be fluctuance in the central portion of the muscle. With that being said I'm somewhat more concerned about the fact that this might indicate an abscess formation at this location. I do believe that an ultrasound would be appropriate. This is likely something we need to try to do as soon as possible. He has been switch to mupirocin ointment and he is no longer using the steroid ointment as prescribed by dermatology he sees them again next week he's been decreased from 60 to 40 mg of prednisone. 03/09/18 on evaluation today patient actually appears to be doing a little better compared to last time I saw him. There's not as much erythema surrounding the wound itself. He I did review his most recent infectious disease note which was dated 02/24/18. He saw Dr. Michel Bickers in [place]. 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 Patient is now off of antibiotics and they are continually observing this. There seems to be no change in the past two weeks in my pinion based on what the patient says  and what I see today compared to what Dr. Megan Salon likely saw two weeks ago. No fevers, chills, nausea, or vomiting noted at this time. 03/23/18 on evaluation today patient's wound actually appears to be showing signs of improvement which is good news. He is currently  still on the Dapsone. He is also working on tapering the prednisone to get off of this and Dr. Lottie Rater is working with him in this regard. Nonetheless overall I feel like the wound is doing well it does appear based on the infectious disease note that I reviewed from Dr. 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 I did also review the results of his ultrasound of left lower extremity which revealed there was a dentist tissue in the base of the wound without an abscess noted. 04/06/18 on evaluation today the patient's left lateral lower extremity ulcer actually appears to be doing fairly well which is excellent news. There does not appear to be any evidence of infection at this time which is also great news. Overall he still does have a significantly large ulceration although little by little he seems to be making progress. He is down to 10 mg a day of the prednisone. 04/20/18 on evaluation today patient actually appears to be doing excellent at this time in regard to his left lower extremity ulcer. He's making signs of good progress unfortunately this is taking much longer than we would really like to see but nonetheless he is making progress. Fortunately there does not appear to be any evidence of infection at this time. No fevers, chills, nausea, or vomiting noted at this time. The patient has not been using the Santyl due to the cost he hadn't got in this field yet. He's mainly been using the antibiotic ointment topically. Subsequently he also tells me that he really has not been scrubbing in the shower I think this would  be helpful again as I told him it doesn't have to be anything too aggressive to even make it believe just enough to keep it free of some of the loose slough and biofilm on the wound surface. 05/11/18 on evaluation today patient's wound appears to be making slow but sure progress in regard to the left lateral lower extremity ulcer. He is been tolerating the dressing changes without complication. Fortunately there does not appear to be any evidence of infection at this time. He is still just using triple antibiotic ointment along with clobetasol occasionally over the area. He never got the Santyl and really does not seem to intend to in my pinion. 06/01/18 on evaluation today patient appears to be doing a little better in regard to his left lateral lower extremity ulcer. He states that overall he does not feel like he is doing as well with the Dapsone as he did with the prednisone. Nonetheless he sees his dermatologist later today and is gonna talk to them about the possibility of going back on the prednisone. Overall again I believe that the wound would be better if you would utilize Santyl but he really does not seem to be interested in going back to the [place] at this point. He has been using triple antibiotic ointment. 06/15/18 on evaluation today patient's wound actually appears to be doing about the same at this point. Fortunately there is no signs of infection at this time. He has made slight improvements although he continues to not really want to clean the wound bed at this point. He states that he just doesn't mess with it he doesn't want to cause any problems with everything else he has going on. He has been on medication, antibiotics as prescribed by his dermatologist, for  a staff infection of his lower extremities which is really drying out now and looking much better he tells me. Fortunately there is no sign of overall infection. 06/29/18 on evaluation today patient appears to be doing well  in regard to his left lateral lower surety ulcer all things considering. Fortunately his staff infection seems to be greatly improved compared to previous. He has no signs of infection and this is drying up quite nicely. He is still the doxycycline for this is no longer on cental, Dapsone, or any of the other medications. His dermatologist has recommended possibility of an infusion but right now he does not want to proceed with that. 07/13/18 on evaluation today patient appears to be doing about the same in regard to his left lateral lower surety ulcer. Fortunately there's no signs of infection at this time which is great news. Unfortunately he still builds up a significant amount of Slough/biofilm of the surface of the wound he still is not really cleaning this as he should be appropriately. Again I'm able to easily with saline and gauze remove the majority of this on the surface which if you would do this at home would likely be a dramatic improvement for him as far as getting the area to improve. Nonetheless overall I still feel like he is making progress is just very slow. I think Santyl will be of benefit for him as well. Still he has not gotten this as of this point. 07/27/18 on evaluation today patient actually appears to be doing little worse in regards of the erythema around the periwound region of the wound he also tells me that he's been having more drainage currently compared to what he was experiencing last time I saw him. He states not quite as bad as what he had because this was infected previously but nonetheless is still appears to be doing poorly. Fortunately there is no evidence of systemic infection at this point. The patient tells me that he is not going to be able to afford the Santyl. He is still waiting to hear about the infusion therapy with his dermatologist. Apparently she wants an updated colonoscopy first. 08/10/18 on evaluation today patient appears to be doing better in  regard to his left lateral lower extremity ulcer. Fortunately he is showing signs of improvement in this regard he's actually been approved for Remicade infusion's as well although this has not been scheduled as of yet. Fortunately there's no signs of active infection at this time in regard to the wound although he is having some issues with infection of the right lower extremity is been seen as dermatologist for this. Fortunately they are definitely still working with him trying to keep things under control. [name], [name] ([id number]) 09/07/18 on evaluation today patient is actually doing rather well in regard to his left lateral lower extremity ulcer. He notes these actually having some hair grow back on his extremity which is something he has not seen in years. He also tells me that the pain is really not giving them any trouble at this time which is also good news overall she is very pleased with the progress he's using a combination of the mupirocin along with the probate is all mixed. 09/21/18 on evaluation today patient actually appears to be doing fairly well all things considered in regard to his looks from the ulcer. He's been tolerating the dressing changes without complication. Fortunately there's no signs of active infection at this time which is good news he is  still on all antibiotics or prevention of the staff infection. He has been on prednisone for time although he states it is gonna contact his dermatologist and see if she put them on a short course due to some irritation that he has going on currently. Fortunately there's no evidence of any overall worsening this is going very slow I think cental would be something that would be helpful for him although he states that $50 for tube is quite expensive. He therefore is not willing to get that at this point. 10/06/18 on evaluation today patient actually appears to be doing decently well in regard to his left lateral leg ulcer. He's been  tolerating the dressing changes without complication. Fortunately there's no signs of active infection at this time. Overall I'm actually rather pleased with the progress he's making although it's slow he doesn't show any signs of infection and he does seem to be making some improvement. I do believe that he may need a switch up and dressings to try to help this to heal more appropriately and quickly. 10/19/18 on evaluation today patient actually appears to be doing better in regard to his left lateral lower extremity ulcer. This is shown signs of having much less Slough buildup at this point due to the fact he has been using the [employer]. Obviously this is very good news. The overall size of the wound is not dramatically smaller but again the appearance is. 11/02/18 on evaluation today patient actually appears to be doing quite well in regard to his lower [place] ulcer. A lot of the skin around the ulcer is actually somewhat irritating at this point this seems to be more due to the dressing causing irritation from the adhesive that anything else. Fortunately there is no signs of active infection at this time. 11/24/18 on evaluation today patient appears to be doing a little worse in regard to his overall appearance of his lower extremity ulcer. There's more erythema and warmth around the wound unfortunately. He is currently on doxycycline which he has been on for some time. With that being said I'm not sure that seems to be helping with what appears to possibly be an acute cellulitis with regard to his left lower extremity ulcer. No fevers, chills, nausea, or vomiting noted at this time. 12/08/18 on evaluation today patient's wounds actually appears to be doing significantly better compared to his last evaluation. He has been using Santyl along with alternating tripling about appointment as well as the steroid cream seems to be doing quite well and the wound is showing signs of improvement which is  excellent news. Fortunately there's no evidence of infection and in fact his culture came back negative with only normal skin flora noted. 12/21/2018 upon evaluation today patient actually appears to be doing excellent with regard to his ulcer. This is actually the best that I have seen it since have been helping to take care of him. It is both smaller as well as less slough noted on the surface of the wound and seems to be showing signs of good improvement with new skin growing from the edges. He has been using just the triamcinolone he does wonder if he can get a refill of that ointment today. 01/04/2019 upon evaluation today patient actually appears to be doing well with regard to his left lateral lower extremity ulcer. With that being said it does not appear to be that he is doing quite as well as last time as far as progression is concerned.  There does not appear to be any signs of infection or significant irritation which is good news. With that being said I do believe that he may benefit from switching to a collagen based dressing based on how clean The wound appears. 01/18/2019 on evaluation today patient actually appears to be doing well with regard to his wound on the left lower extremity. He is not made a lot of progress compared to where we were previous but nonetheless does seem to be doing okay at this time which is good news. There is no signs of active infection which is also good news. My only concern currently is I do wish we can get him into utilizing the collagen dressing his insurance would not pay for the supplies that we ordered although it appears that he may be able to order this through his supply company that he typically utilizes. This is [employer]. Nonetheless he did try to order it during the office visit today and it appears this did go through. We will see if he can get that it is a different brand but nonetheless he has collagen and I do think will be beneficial. 02/01/2019  on evaluation today patient actually appears to be doing a little worse today in regard to the overall size of his wounds. Fortunately there is no signs of active infection at this time. That is visually. Nonetheless when this is happened before it was due to infection. For that reason were somewhat concerned about that this time as well. 02/08/2019 on evaluation today patient unfortunately appears to be doing slightly worse with regard to his wound upon evaluation today. Is measuring a little deeper and a little larger unfortunately. I am not really sure exactly what is causing this to enlarge he actually did see his dermatologist she is going to see about initiating Humira for him. Subsequently she also did do steroid injections into the wound itself in the periphery. Nonetheless still nonetheless he seems to be getting a little bit larger he is gone back to just using the steroid cream topically which I think is appropriate. I would say hold off on the collagen for the time being is definitely a good thing to do. Based on the culture results which we finally did get the final result back regarding it shows staph as the bacteria noted again that can be a normal skin bacteria based on the fact however he is having increased drainage and worsening of the wound measurement wise I would go ahead and place him on an antibiotic today I do believe for this. 02/15/2019 on evaluation today patient actually appears to be doing somewhat better in regard to his ulcer. There is no signs of worsening at this time I did review his culture results which showed evidence of Staphylococcus aureus but not MRSA. Again this could just be more related to the normal skin bacteria although he states the drainage has slowed down quite a bit he may have had a mild infection not just colonization. And was much smaller and then since around10/04/2019 on evaluation today patient appears to be doing unfortunately worse as far as the  size of the wound. I really feel like that this is steadily getting larger again it had been doing excellent right at the beginning of September we have seen a steady increase in the area of the wound it is almost 2-1/2 times the size it was on September 1. Obviously this is a bad trend this is not wanting to see.  For that reason we went back to using just the topical triamcinolone cream which does seem to help with inflammation. I checked him for bacteria by way of culture and nothing showed positive there. I am considering giving him a short course of a tapering steroid Dosepak today to see if that is can be beneficial for him. The patient is in agreement with giving that a try. 03/08/2019 on evaluation today patient appears to be doing very well in comparison to last evaluation with regard to his lower extremity ulcer. This is showing signs of less inflammation and actually measuring slightly smaller compared to last time every other week over the past month and a half he has been measuring larger larger larger. Nonetheless I do believe that the issue has been inflammation the prednisone does seem to [facility], [place] ([id number]) have been beneficial for him which is good news. No fevers, chills, nausea, vomiting, or diarrhea. 03/22/2019 on evaluation today patient appears to be doing about the same with regard to his leg ulcer. He has been tolerating the dressing changes without complication. With that being said the wound seems to be mostly arrested at its current size but really is not making any progress except for when we prescribed the prednisone. He did show some signs of dropping as far as the overall size of the wound during that interval week. Nonetheless this is something he is not on long-term at this point and unfortunately I think he is getting need either this or else the Humira which his dermatologist has discussed try to get approval for. With that being said he will be seeing his  dermatologist on the 11th of this month that is November. 04/19/2019 on evaluation today patient appears to be doing really about the same the wound is measuring slightly larger compared to last time I saw him. He has not been into the office since November 2 due to the fact that he unfortunately had Covid as that his entire family. He tells me that it was rough but they did pull-through and he seems to be doing much better. Fortunately there is no signs of active infection at this time. No fevers, chills, nausea, vomiting, or diarrhea. 05/10/2019 on evaluation today patient unfortunately appears to be doing significantly worse as compared to last time I saw him. He does tell me that he has had his first dose of Humira and actually is scheduled to get the next one in the upcoming week. With that being said he tells me also that in the past several days he has been having a lot of issues with green drainage she showed me a picture this is more blue-green in color. He is also been having issues with increased sloughy buildup and the wound does appear to be larger today. Obviously this is not the direction that we want everything to take based on the starting of his Humira. Nonetheless I think this is definitely a result of likely infection and to be honest I think this is probably Pseudomonas causing the infection based on what I am seeing. 05/24/2019 on evaluation today patient unfortunately appears to be doing significantly worse compared to his prior evaluation with me 2 weeks ago. I did review his culture results which showed that he does have Staph aureus as well as Pseudomonas noted on the culture. Nonetheless the Levaquin that I prescribed for him does not appear to have been appropriate and in fact he tells me he is no longer experiencing the green drainage  and discharge that he had at the last visit. Fortunately there is no signs of active infection at this time which is good news although  the wound has significantly worsened it in fact is much deeper than it was previous. We have been utilizing up to this point triamcinolone ointment as the prescription topical of choice but at this time I really feel like that the wound is getting need to be packed in order to appropriately manage this due to the deeper nature of the wound. Therefore something along the lines of an alginate dressing may be more appropriate. 05/31/2019 upon inspection today patient's wound actually showed signs of doing poorly at this point. Unfortunately he just does not seem to be making any good progress despite what we have tried. He actually did go ahead and pick up the Cipro and start taking that as he was noticing more green drainage he had previously completed the Levaquin that I prescribed for him as well. Nonetheless he missed his appointment for the seventh last week on Wednesday with the wound care center and [facility] where his dermatologist referred him. Obviously I do think a second opinion would be helpful at this point especially in light of the fact that the patient seems to be doing so poorly despite the fact that we have tried everything that I really know how at this point. The only thing that ever seems to have helped him in the past is when he was on high doses of continual steroids that did seem to make a difference for him. 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 06/07/2019 upon evaluation today patient unfortunately appears to be doing worse yet again with regard to his wound. In fact I am starting to question whether or not he may have a fluid pocket in the muscle at this point based on the bulging and the soft appearance to the central portion of the muscle area. There is not anything draining from the muscle itself at this time  which is good news but nonetheless the wound is expanding. I am not really seeing any results of the Humira as far as overall wound progression based on what I am seeing at this point. The patient has been referred for second opinion with regard to his wound to the [facility] wound care center by his dermatologist which I definitely am not in opposition to. Unfortunately we tried multiple dressings in the past including collagen, alginate, and at one point even Hydrofera Blue. With that being said he is never really used it for any significant amount of time due to the fact that he often complains of pain associated with these dressings and then will go back to either using the Santyl which she has done intermittently or more frequently the triamcinolone. He is also using his own compression stockings. We have wrapped him in the past but again that was something else that he really was not a big fan of. 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 He seemed to do much better when he was on the steroids although honestly I understand that the steroids are not necessarily the best medication to be on long-term obviously 06/14/2019 on evaluation today patient appears to be doing actually a little bit better with regard to the overall appearance with his leg. Unfortunately he does continue to have issues with what appears to be some fluid underneath the muscle although he did see the wound specialty center at [facility] last week their main goals were to see about infusion therapy in place of the Humira as they feel like that is not quite strong enough. They also recommended that we continue with the  treatment otherwise as we are they felt like that was appropriate and they are okay with him continuing to follow-up here with [place] in that regard. With that being said they are also sending him to the vein specialist there to see about vein stripping and if that would be of benefit for him. Subsequently they also did not really address whether or not an ultrasound of the muscle area to see if there is anything that needs to be addressed here would be appropriate or not. For that reason I discussed this with him last week I think we may proceed down that road at this point. 06/21/2019 upon evaluation today patient's wound actually appears to be doing slightly better compared to previous evaluations. I do believe that he has made a difference with regard to the progression here with the use of oral steroids. Again in the past has been the only thing that is really calm things down. He does tell me that from [facility] is gotten a good news from there that there are no further vein stripping that is necessary at this point. I do not have that available for review today although the patient did relay this to me. He also did obtain and have the ultrasound of the wound completed which I did sign off on today. It does appear that there is no fluid collection under the muscle this is likely then just edematous tissue in general. That is also good news. Overall I still believe the inflammation is the main issue here. He did inquire about the possibility of a wound VAC again with the muscle protruding like it is I am not really sure whether the wound VAC is necessarily ideal or not. That is something we will have to consider although I do believe he may need compression wrapping to try to help with edema control which could potentially be of benefit. 06/28/2019 on evaluation today patient appears to be doing slightly better measurement wise although this is not terribly smaller he least seems to be trending towards that  direction. With that being said he still seems to have purulent drainage noted in the wound bed at this time. He has been on Levaquin followed by Cipro over the past month. Unfortunately he still seems to have some issues with active infection at this time. I did perform a culture last week in order to evaluate and see if indeed there was still anything going on. Subsequently the culture did come back showing Pseudomonas which is consistent with the drainage has been having which is blue-green in color. He also has had an odor that again was somewhat consistent with Pseudomonas as well. Long story short it appears that the culture showed an intermediate finding with regard to how well the Cipro will work  for the Pseudomonas infection. Subsequently being that he does not seem to be clearing up and at best what we are doing is just keeping this at [place] I think he may need to see infectious disease to discuss IV antibiotic options. TYSHUN, TUCKERMAN ([id number]) 07/05/2019 upon evaluation today patient appears to be doing okay in regard to his leg ulcer. He has been tolerating the dressing changes at this point without complication. Fortunately there is no signs of active infection at this time which is good news. No fevers, chills, nausea, vomiting, or diarrhea. With that being said he does have an appointment with infectious disease tomorrow and his primary care on Wednesday. Again the reason for the infectious disease referral was due to the fact that he did not seem to be fully resolving with the use of oral antibiotics and therefore we were thinking that IV antibiotic therapy may be necessary secondary to the fact that there was an intermediate finding for how effective the Cipro may be. Nonetheless again he has been having a lot of purulent and even green drainage. Fortunately right now that seems to have calmed down over the past week with the reinitiation of the oral antibiotic. Nonetheless we will see  what Dr. Megan Salon has to say. 07/12/2019 upon evaluation today patient appears to be doing about the same at this point in regard to his left lower extremity ulcer. Fortunately there is no signs of active infection at this time which is good news I do believe the Levaquin has been beneficial I did review Dr. Hale Bogus note and to be honest I agree that the patient's leg does appear to be doing better currently. What we found in the past as he does not seem to really completely resolve he will stop the antibiotic and then subsequently things will revert back to having issues with blue-green drainage, increased pain, and overall worsening in general. Obviously that is the reason I sent him back to infectious disease. 07/19/2019 upon evaluation today patient appears to be doing roughly the same in size there is really no dramatic improvement. He has started back on the Levaquin at this point and though he seems to be doing okay he did still have a lot of blue/green drainage noted on evaluation today unfortunately. I think that this is still indicative more likely of a Pseudomonas infection as previously noted and again he does see Dr. Megan Salon in just a couple of days. I do not know that were really able to effectively clear this with just oral antibiotics alone based on what I am seeing currently. Nonetheless we are still continue to try to manage as best we can with regard to the patient and his wound. I do think the wrap was helpful in decreasing the edema which is excellent news. No fevers, chills, nausea, vomiting, or diarrhea. 07/26/2019 upon evaluation today patient appears to be doing slightly better with regard to the overall appearance of the muscle there is no dark discoloration centrally. Fortunately there is no signs of active infection at this time. No fevers, chills, nausea, vomiting, or diarrhea. Patient's wound bed currently the patient did have an appointment with Dr. Megan Salon at [facility] last week. With that being said Dr. Megan Salon the patient states was still somewhat hesitant about put him on any IV antibiotics he wanted [place] to repeat cultures today and then see where things go going forward. He does look like Dr. Megan Salon because of some improvement the patient did have with the Levaquin  wanted [place] to see about repeating cultures. If it indeed grows the Pseudomonas again then he recommended a possibility of considering a PICC line placement and IV antibiotic therapy. He plans to see the patient back in 1 to 2 weeks. 08/02/2019 upon evaluation today patient appears to be doing poorly with regard to his left lower extremity. We did get the results of his culture back it shows that he is still showing evidence of Pseudomonas which is consistent with the purulent/blue-green drainage that he has currently. Subsequently the culture also shows that he now is showing resistance to the oral fluoroquinolones which is unfortunate as that was really the only thing to treat the infection prior. I do believe that he is looking like this is going require IV antibiotic therapy to get this under control. Fortunately there is no signs of systemic infection at this time which is good news. The patient does see Dr. Megan Salon tomorrow. 08/09/2019 upon evaluation today patient appears to be doing better with regard to his left lower extremity ulcer in regard to the overall appearance. He is currently on IV antibiotic therapy. As ordered by Dr. Megan Salon. Currently the patient is on ceftazidime which she is going to take for the next 2 weeks and then follow-up for 4 to 5-week appointment with Dr. Megan Salon. The patient started this this past Friday symptoms have not for a total of 3 days currently in full. 08/16/2019 upon evaluation today patient's wound actually does show muscle in the base of the wound but in general does appear to be much better as far as the overall evidence of infection is  concerned. In fact I feel like this is for the most part cleared up he still on the IV antibiotics he has not completed the full course yet but I think he is doing much better which is excellent news. 08/23/2019 upon evaluation today patient appears to be doing about the same with regard to his wound at this point. He tells me that he still has pain unfortunately. Fortunately there is no evidence of systemic infection at this time which is great news. There is significant muscle protrusion. 09/13/19 upon evaluation today patient appears to be doing about the same in regard to his leg unfortunately. He still has a lot of drainage coming from the ulceration there is still muscle exposed. With that being said the patient's last wound culture still showed an intermediate finding with regard to the Pseudomonas he still having the bluish/green drainage as well. Overall I do not know that the wound has completely cleared of infection at this point. Fortunately there is no signs of active infection systemically at this point which is good news. 09/20/2019 upon evaluation today patient's wound actually appears to be doing about the same based on what I am seeing currently. I do not see any signs of systemic infection he still does have evidence of some local infection and drainage. He did see Dr. Megan Salon last week and Dr. Megan Salon states that he probably does need a different IV antibiotic although he does not want to put him on this until the patient begins the Remicade infusion which is actually scheduled for about 10 days out from today on 13 May. Following that time Dr. Megan Salon is good to see him back and then will evaluate the feasibility of starting him on the IV antibiotic therapy once again at that point. I do not disagree with this plan I do believe as Dr. Megan Salon stated in his note that  I reviewed today that the patient's issue is multifactorial with the pyoderma being 1 aspect of this that were hoping  the Remicade will be helpful for her. In the meantime I think the gentamicin is, helping to keep things under decent okay control in regard to the ulcer. 09/27/2019 upon evaluation today patient appears to be doing about the same with regard to his wound still there is a lot of muscle exposure though he does have some hyper granulation tissue noted around the edge and actually some granulation tissue starting to form over the muscle which is actually good news. Fortunately there is no evidence of active infection which is also good news. His pain is less at this point. 5/21; this is a patient I have not seen in a long time. 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 He has a large wound on the left lateral leg with protruding muscle. He comes in the clinic today showing the same area on his left medial ankle. He says there is been a spot there for some time although we have not previously defined this. Today he has a clearly defined area with slight amount of skin breakdown surrounded by raised areas with a purplish hue in color. This is not painful he says it is irritated. 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 He still has muscle protruding from the lateral portion of his left leg but fortunately the new area that they were concerned about at his last visit does not appear to have opened at this point. He is currently on Remicade infusions and seems to be doing better in my opinion in fact the wound itself seems to be overall much better. The purplish discoloration that he did have seems to have resolved and I think that is a good sign that hopefully the Remicade is doing its job. He does have some biofilm noted over the surface of the wound. 11/01/2019 on evaluation today patient's wound actually appears to be doing excellent at this time. Fortunately  there is no evidence of active infection and overall I feel like he is making great progress. The Remicade seems to be due excellent job in my opinion. KIT, MOLLETT ([id number]) 11/08/19 evaluation today vision actually appears to be doing quite well with regard to his weight ulcer. He's been tolerating dressing changes without complication. Fortunately there is no evidence of infection. No fevers, chills, nausea, or vomiting noted at this time. Overall states that is having more itching than pain which is actually a good sign in my opinion. 12/13/2019 upon evaluation today patient appears to be doing well today with regard to his wound. He has been tolerating the dressing changes without complication. Fortunately there is no sign of active infection at this time. No fevers, chills, nausea, vomiting, or diarrhea. Overall I feel like the infusion therapy has been very beneficial for him. 01/06/2020 on evaluation today patient appears to be doing well with regard to his wound. This is measuring smaller and actually looks to be doing better. Fortunately there is no signs of active infection at this point. No fevers, chills, nausea, vomiting, or diarrhea. With that being said he does still have the blue-green drainage but this does not seem to be causing any significant issues currently. He has been using the gentamicin that does seem to be keeping things under decent control at this point. He goes later this  morning for his next infusion therapy for the pyoderma which seems to also be very beneficial. 02/07/2020 on evaluation today patient appears to be doing about the same in regard to his wounds currently. Fortunately there is no signs of active infection systemically he does still have evidence of local infection still using gentamicin. He also is showing some signs of improvement albeit slowly I do feel like we are making some progress here. 02/21/2020 upon evaluation today patient appears to be making  some signs of improvement the wound is measuring a little bit smaller which is great news and overall I am very pleased with where he stands currently. He is going to be having infusion therapy treatment on the 15th of this month. Fortunately there is no signs of active infection at this time. 03/13/2020 I do believe patient's wound is actually showing some signs of improvement here which is great news. He has continue with the infusion therapy through rheumatology/dermatology at [facility]. That does seem to be beneficial. I still think he gets as much benefit from this as he did from the prednisone initially but nonetheless obviously this is less harsh on his body that the prednisone as far as they are concerned. 03/31/2020 on evaluation today patient's wound actually showing signs of some pretty good improvement in regard to the overall appearance of the wound bed. There is still muscle exposed though he does have some epithelial growth around the edges of the wound. Fortunately there is no signs of active infection at this time. No fevers, chills, nausea, vomiting, or diarrhea. 04/24/2020 upon evaluation today patient appears to be doing about the same in regard to his leg ulcer. He has been tolerating the dressing changes without complication. Fortunately there is no signs of active infection at this time. No fevers, chills, nausea, vomiting, or diarrhea. With that being said he still has a lot of irritation from the bandaging around the edges of the wound. We did discuss today the possibility of a referral to plastic surgery. 05/22/2020 on evaluation today patient appears to be doing well with regard to his wounds all things considered. He has not been able to get the Chantix apparently there is a recall nurse that I was unaware of put out by [employer] involuntarily. Nonetheless for now I am and I have to do some research into what may be the best option for him to help with quitting in regard to smoking and  we discussed that today. 06/26/2020 upon evaluation today patient appears to be doing well with regard to his wound from the standpoint of infection I do not see any signs of infection at this point. With that being said unfortunately he is still continuing to have issues with muscle exposure and again he is not having a whole lot of new skin growth unfortunately. There does not appear to be any signs of active infection at this time. No fevers, chills, nausea, vomiting, or diarrhea. 07/10/2020 upon evaluation today patient appears to be doing a little bit more poorly currently compared to where he was previous. I am concerned currently about an active infection that may be getting worse especially in light of the increased size and tenderness of the wound bed. No fevers, chills, nausea, vomiting, or diarrhea. 07/24/2020 upon evaluation today patient appears to be doing poorly in regard to his leg ulcer. He has been tolerating the dressing changes without complication but unfortunately is having a lot of discomfort. Unfortunately the patient has an infection with Pseudomonas resistant  to gentamicin as well as fluoroquinolones. Subsequently I think he is going require possibly IV antibiotics to get this under control. I am very concerned about the severity of his infection and the amount of discomfort he is having. 07/31/2020 upon evaluation today patient appears to be doing about the same in regard to his leg wound. He did see Dr. Megan Salon and Dr. Megan Salon is actually going to start him on IV antibiotics. He goes for the PICC line tomorrow. With that being said there do not have that run for 2 weeks and then see how things are doing and depending on how he is progressing they may extend that a little longer. Nonetheless I am glad this is getting ready to be in place and definitely feel it may help the patient. In the meantime is been using mainly triamcinolone to the wound bed has an  anti-inflammatory. 08/07/2020 on evaluation today patient appears to be doing well with regard to his wound compared even last week. In the interim he has gotten the PICC line placed and overall this seems to be doing excellent. There does not appear to be any evidence of infection which is great news systemically although locally of course has had the infection this appears to be improving with the use of the antibiotics. 08/14/2020 upon evaluation today patient's wound actually showing signs of excellent improvement. Overall the irritation has significantly improved the drainage is back down to more of a normal level and his pain is really pretty much nonexistent compared to what it was. Obviously I think that this is significantly improved secondary to the IV antibiotic therapy which has made all the difference in the world. Again he had a resistant form of Pseudomonas for which oral antibiotics just was not cutting it. Nonetheless I do think that still we need to consider the possibility of a surgical closure for this wound is been open so long and to be honest with muscle exposed I think this can be very hard to get this to close outside of this although definitely were still working to try to do what we can in that regard. 08/21/2020 upon evaluation today patient appears to be doing very well with regard to his wounds on the left lateral lower extremity/calf area. Fortunately there does not appear to be signs of active infection which is great news and overall very pleased with where things stand today. He is actually wrapping up his treatment with IV antibiotics tomorrow. After that we will see where things go from there. 08/28/2020 upon evaluation today patient appears to be doing decently well with regard to his leg ulcer. There does not appear to be any signs of active infection which is great news and overall very pleased with where things stand today. No fevers, chills, nausea, vomiting, or  diarrhea. 09/18/2020 upon evaluation today patient appears to be doing well with regard to his infection which I feel like is better. Unfortunately he is not doing as well with regard to the overall size of the wound which is not nearly as good at this point. I feel like that he may be having an issue here with the pyoderma being somewhat out of control. I think that he may benefit from potentially going back and talking to the dermatologist about MAURISIO, RUDDY ([id number]) what to do from the pyoderma standpoint. I am not certain if the infusions are helping nearly as much is what the prednisone did in the past. 10/02/2020 upon evaluation today patient appears to  be doing well with regard to his leg ulcer. He did go to the [facility]iatric nurse. Unfortunately they feel like there is a 10% chance that most that he would be able to heal and that the skin graft would take. Obviously this has led him to not be able to go down that path as far as treatment is concerned. Nonetheless he does seem to be doing a little bit better with the prednisone that I gave him last time. I think that he may need to discuss with dermatology the possibility of long-term prednisone as that seems to be what is most helpful for him to be perfectly honest. I am not sure the Remicade is really doing the job. 10/17/2020 upon evaluation today patient appears to be doing a little better in regard to his wound. In fact the case has been since we did the prednisone on May 2 for him that we have noticed a little bit of improvement each time we have seen a size wise as well as appearance wise as well as pain wise. I think the prednisone has had a greater effect then the infusion therapy has to be perfectly honest. With that being said the patient also feels significantly better compared to what he was previous. All of this is good news but nonetheless I am still concerned about the fact that again we are really not set up to long-term manage him  as far as prednisone is concerned. Obviously there are things that you need to be watched I completely understand the risk of prednisone usage as well. 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 With all that being said I do believe that we can give him another round of the prednisone which she is requesting today because of the improvement that he seen since we did that first round. 10/30/2020 upon evaluation today patient's wound actually is showing signs of doing quite well. There does not appear to be any evidence of infection which is great news and overall very pleased with where things stand today. No fevers, chills, nausea, vomiting, or diarrhea. He tells me that the prednisone still has seem to have helped he wonders if we can extend that for just a little bit longer. He did not have the appointment with a dermatologist although he did have an infusion appointment last Friday. That was at [facility] With that being said he tells me he could not do both that as well as the appointment with the physician on the same day therefore that is can have to be rescheduled. I really want to see if there is anything they feel like that could be done differently to try to help this out as I am not really certain that the infusions are helping significantly here. 11/13/2020 upon evaluation today patient unfortunately appears to be doing somewhat poorly in regard to his wound I feel like this is actually worsening from the standpoint of the pyoderma spreading. I still feel like that he may need something different as far as trying to manage this going forward. Again we did the prednisone unfortunately his blood sugars are not doing so well because of this. Nonetheless I believe that the patient likely needs to try topical steroid. We have done triamcinolone for a while I think going with something stronger such as clobetasol could be beneficial again this is not  something I do lightly I discussed this with the patient that again this does  not normally put underneath an occlusive dressing. Nonetheless I think a thin film as such could help with some of the stronger anti-inflammatory effects. We discussed this today. He would like to try to give this a trial for the next couple weeks. I definitely think that is something that we can do. Evaluate7/03/2021 and today patient's wound bed actually showed signs of doing really about the same. There was a little expansion of the size of the wound and that leading edge that we done looking out although the clobetasol does seem to have slowed this down a bit in my opinion. There is just 1 small area that still seems to be progressing based on what I see. Nonetheless I am concerned about the fact this does not seem to be improving if anything seems to be doing a little bit worse. I do not know that the infusions are really helping him much as next infusion is August 5 his appointment with dermatology is July 25. Either way I really think that we need to have a conversation potentially about this and I am actually going to see if I can talk with Dr. Lillia Carmel in order to see where things stand as well. 12/11/2020 upon evaluation today patient appears to be doing worse in regard to his leg ulcer. Unfortunately I just do not think this is making the progress that I would like to see at this point. Honestly he does have an appointment with dermatology and this is in 2 days. I am wondering what they may have to offer to help with this. Right now what I am seeing is that he is continuing to show signs of worsening little by little. Obviously that is not great at all. Is the exact opposite of what we are looking for. 12/18/2020 upon evaluation today patient appears to be doing a little better in regard to his wound. The dermatologist actually did do some steroid injections into the wound which does seem to have been beneficial in  my opinion. That was on the 25th already this looks a little better to me than last time I saw him. With that being said we did do a culture and this did show that he has Staph aureus noted in abundance in the wound. With that being said I do think that getting him on an oral antibiotic would be appropriate as well. Also think we can compression wrap and this will make a difference as well. 12/28/2020 upon evaluation today patient's wound is actually showing signs of doing much better. I do believe the compression wrap is helping he has a lot of drainage but to be honest I think that the compression is helping to some degree in this regard as well as not draining through which is also good news. No fevers, chills, nausea, vomiting, or diarrhea. 01/04/2021 upon evaluation today patient appears to be doing well with regard to his wound. Overall things seem to be doing quite well. He did have a little bit of reaction to the CarboFlex Sorbact he will be using that any longer. With that being said he is controlled as far as the drainage is concerned overall and seems to be doing quite well. I do not see any signs of active infection at this time which is great news. No fevers, chills, nausea, vomiting, or diarrhea. 01/11/2021 upon evaluation today patient appears to be doing well with regard to his wounds. He has been tolerating the dressing changes without complication. Fortunately there does not appear  to be any signs of active infection at this time which is great news. Overall I am extremely pleased with where we stand currently. No fevers, chills, nausea, vomiting, or diarrhea. Where using clobetasol in the wound bed he has a lot of new skin growth which is awesome as well. 01/18/2021 upon evaluation today patient appears to be doing very well in regard to his leg ulcer. He has been tolerating the dressing changes without complication. Fortunately there does not appear to be any signs of active infection  which is great news. In general I think that he is making excellent progress 01/25/2021 upon evaluation today patient appears to be doing well with regard to his wound on the leg. I am actually extremely pleased with where things stand today. There does not appear to be any signs of active infection which is great news and overall I think that we are definitely headed in the appropriate direction based on what I am seeing currently. There does not appear to be any signs of active infection also excellent news. 02/06/2021 upon evaluation today patient appears to be doing well with regard to his wound. Overall visually this is showing signs of significant improvement which is great news. I do not see any signs of active infection systemically which is great even locally I do not think that we are seeing any major complications here. We did do fluorescence imaging with the [employer] today. The patient does have some odor and drainage noted and again this is something that I think would benefit him to probably come more frequently for nurse visits. 02/19/2021 upon evaluation today patient actually appears to be doing quite well in regard to his wound. He has been tolerating the dressing Borland, Kamar ([id number]) changes without complication and overall I think that this is making excellent progress. I do not see any evidence of active infection at this point which is great news as well. No fevers, chills, nausea, vomiting, or diarrhea. 10/10; wound is made nice progress healthy granulation with a nice rim of epithelialization which seems to be expanding even from last week he has a deeper area in the inferior part of the more distal part of the wound with not quite as healthy as surface. This area will need to be followed. Using clobetasol and Hydrofera Blue 03/05/2021 upon evaluation today patient appears to be doing very well in regard to his leg ulcer. He has been tolerating dressing changes without  complication. Fortunately there does not appear to be any signs of active infection which is great news and overall I am extremely pleased with where we stand currently. 03/12/2021 upon evaluation today patient appears to be doing well with regard to his wound in fact this is extremely extremely good based on what we are seeing today there does not appear to be any signs of active infection and overall I think that he is doing awesome from the standpoint of healing in general. I am extremely pleased with how things seem to be progressing with regard to this pyoderma. Clobetasol has done wonders for him. I think the compression wrapping has also been of great benefit. 03/19/2021 upon evaluation today patient appears to be doing well with regard to his wound. He is tolerating the dressing changes without complication. In fact I feel like that he is actually making excellent progress at this point based on what I am seeing. No fevers, chills, nausea, vomiting, or diarrhea. 03/26/2021 upon evaluation today patient appears to be doing well  with regard to his wound. This again is measuring smaller and looking better. Again the progress is slow but nonetheless continual with what we have been seeing. I do believe that the current plan is doing awesome for him. 04/09/2021 upon evaluation today patient appears to be doing well with regard to his leg ulcer. This is showing signs of excellent improvement the muscle is completely closed over and there does not appear to be any evidence of inflammation at this point his drainage is significantly improved. Overall I think that he would be a good candidate for looking into a skin substitute at this point as well. We will get a look into some approvals in that regard. Potentially TheraSkin as well as Apligraf could both be considered just depending on insurance coverage. 04/16/2021 upon evaluation today patient appears to be doing well at this point. He has been  approved for the Apligraf which we could definitely order although I would like to try to get the TheraSkin approved if at all possible. I did fax notes into them today and I Georgina Peer try to give a call as well. Overall the wound appears to be doing decently well today. 04/20/2021 upon evaluation today patient actually appears to be doing quite well in regard to his wound with that being said we are trying to see what we can do about speeding up the healing process. For that reason we did discuss the possibility of a skin substitute. We got the Apligraf approved. For that reason we will get a go ahead and see what we can do with the Apligraf at this point. I am still trying to get the TheraSkin approved but I have not heard anything from the insurance company yet I have called and talked to them earlier in the week and to be honest this was about half an hour that I spent on the phone they told me I would hear something within 10-15 business days. 04/27/2021 upon evaluation today patient appears to be doing excellent in regard to his wound. I do believe the Apligraf has been beneficial. With that being said he has had a little bit of increased pain has been a little bit concerned. I do want to go ahead and apply his steroid cream today the clobetasol and then put the Apligraf over I just do not think I want to risk not putting the clobetasol on based on what is been going on here currently. Patient voiced understanding. Electronic Signature(s) Signed: 04/27/2021 5:59:22 PM By: Worthy Keeler PA-C Entered By: Worthy Keeler on 04/27/2021 17:59:22 [name], [name] ([id number]) -------------------------------------------------------------------------------- Physical Exam Details Patient Name: [name] Date of Service: 04/27/2021 8:00 AM Medical Record Number: [id number] Patient Account Number: [id number] Date of Birth/Sex: Sep 27, 1980 (40 y.o. M) Treating RN: Carlene Coria Primary Care Provider: [name] Other Clinician: Referring Provider: [name] Treating Provider/Extender: [name] Cliche in Treatment: 6 Constitutional Well-nourished and well-hydrated in no acute distress. Respiratory normal breathing without difficulty. Psychiatric this patient is able to make decisions and demonstrates good insight into disease process. Alert and Oriented x 3. pleasant and cooperative. Notes Upon inspection patient's wound bed actually showed signs of good granulation and epithelization I did not perform any debridement although mechanically I did clear away some of the slough and biofilm before applying the new Apligraf he tolerated that today without complication this is application #2 of the Apligraf. Electronic Signature(s) Signed: 04/27/2021 5:59:47 PM By: Worthy Keeler PA-C Entered By: Worthy Keeler on  04/27/2021 17:59:47 [name], [name] ([id number]) -------------------------------------------------------------------------------- Physician Orders Details Patient Name: [name] Date of Service: 04/27/2021 8:00 AM Medical Record Number: [id number] Patient Account Number: [id number] Date of Birth/Sex: 1980-08-16 (40 y.o. M) Treating RN: Carlene Coria Primary Care Provider: [name] Other Clinician: Referring Provider: [name] Treating Provider/Extender: [name] Cliche in Treatment: 229 Verbal / Phone Orders: No Diagnosis Coding ICD-10 Coding Code Description I87.2 Venous insufficiency (chronic) (peripheral) L97.222 Non-pressure chronic ulcer of left calf with fat layer exposed E11.622 Type 2 diabetes mellitus with other skin ulcer L88 Pyoderma gangrenosum F17.208 Nicotine dependence, unspecified, with other nicotine-induced disorders Follow-up Appointments o Return Appointment in 1 week. o Nurse Visit as needed - twice a week Bathing/ Shower/ Hygiene o Clean wound with Normal Saline or wound cleanser. Cellular or Tissue Based  Products o Cellular or Tissue Based Product Type: - APLIGRAF #2 o Cellular or Tissue Based Product applied to wound bed; including contact layer, fixation with steri-strips, dry gauze and cover dressing. (DO NOT REMOVE). Edema Control - Lymphedema / Segmental Compressive Device / Other o Optional: One layer of unna paste to top of compression wrap (to act as an anchor). o Elevate, Exercise Daily and Avoid Standing for Long Periods of Time. o Elevate legs to the level of the heart and pump ankles as often as possible o Elevate leg(s) parallel to the floor when sitting. Wound Treatment Wound #1 - Lower Leg Wound Laterality: Left, Lateral Cleanser: Wound Cleanser 3 x Per Week/30 Days Discharge Instructions: Wash your hands with soap and water. Remove old dressing, discard into plastic bag and place into trash. Cleanse the wound with Wound Cleanser prior to applying a clean dressing using gauze sponges, not tissues or cotton balls. Do not scrub or use excessive force. Pat dry using gauze sponges, not tissue or cotton balls. Peri-Wound Care: Moisturizing Lotion 3 x Per Week/30 Days Discharge Instructions: Suggestions: Theraderm, Eucerin, Cetaphil, or patient preference. Secondary Dressing: Xtrasorb Medium 4x5 (in/in) 3 x Per Week/30 Days Discharge Instructions: Apply to wound as directed. Do not cut. Compression Wrap: Medichoice 4 layer Compression System, 35-40 mmHG (Generic) 3 x Per Week/30 Days Discharge Instructions: Apply multi-layer wrap as directed. Electronic Signature(s) Signed: 04/27/2021 6:06:35 PM By: Worthy Keeler PA-C Signed: 04/30/2021 12:20:53 PM By: Carlene Coria RN Entered By: Carlene Coria on 04/27/2021 08:27:02 [name], [name] ([id number]) -------------------------------------------------------------------------------- Problem List Details Patient Name: [name] Date of Service: 04/27/2021 8:00 AM Medical Record Number: [id number] Patient Account Number:  [id number] Date of Birth/Sex: 07-Jul-1980 (40 y.o. M) Treating RN: Carlene Coria Primary Care Provider: [name] Other Clinician: Referring Provider: [name] Treating Provider/Extender: [name] Cliche in Treatment: 229 Active Problems ICD-10 Encounter Code Description Active Date MDM Diagnosis I87.2 Venous insufficiency (chronic) (peripheral) 12/04/2016 No Yes L97.222 Non-pressure chronic ulcer of left calf with fat layer exposed 12/04/2016 No Yes E11.622 Type 2 diabetes mellitus with other skin ulcer 04/09/2021 No Yes L88 Pyoderma gangrenosum 03/26/2017 No Yes F17.208 Nicotine dependence, unspecified, with other nicotine-induced disorders 04/24/2020 No Yes Inactive Problems ICD-10 Code Description Active Date Inactive Date L97.213 Non-pressure chronic ulcer of right calf with necrosis of muscle 04/02/2017 04/02/2017 Resolved Problems ICD-10 Code Description Active Date Resolved Date L97.321 Non-pressure chronic ulcer of left ankle limited to breakdown of skin 10/08/2019 10/08/2019 L03.116 Cellulitis of left lower limb 05/24/2019 05/24/2019 Electronic Signature(s) Signed: 04/27/2021 8:18:46 AM By: Worthy Keeler PA-C Entered By: Worthy Keeler on 04/27/2021 08:18:45 [name], [name] ([id number]) -------------------------------------------------------------------------------- Progress Note Details Patient Name: [name]  Date of Service: 04/27/2021 8:00 AM Medical Record Number: [id number] Patient Account Number: [id number] Date of Birth/Sex: 10-05-1980 (40 y.o. M) Treating RN: Carlene Coria Primary Care Provider: [name] Other Clinician: Referring Provider: [name] Treating Provider/Extender: [name] Cliche in Treatment: 229 Subjective Chief Complaint Information obtained from Patient He is here in follow up evaluation for LLE pyoderma ulcer History of Present Illness (HPI) 12/04/16; 40 year old man who comes into the clinic today for review of a  wound on the posterior left calf. He tells me that is been there for about a year. He is not a diabetic he does smoke half a pack per day. He was seen in the ER on 11/20/16 felt to have cellulitis around the wound and was given clindamycin. An x-ray did not show osteomyelitis. The patient initially tells me that he has a milk allergy that sets off a pruritic itching rash on his lower legs which she scratches incessantly and he thinks that's what may have set up the wound. He has been using various topical antibiotics and ointments without any effect. He works in a [employer] and is on his feet all day. He does not have a prior history of wounds however he does have the rash on both lower legs the right arm and the ventral aspect of his left arm. These are excoriations and clearly have had scratching however there are of macular looking areas on both legs including a substantial larger area on the right leg. This does not have an underlying open area. There is no blistering. The patient tells me that 2 years ago in [place] in response to the rash on his legs he saw a dermatologist who told him he had a condition which may be pyoderma gangrenosum although I may be putting words into his mouth. He seemed to recognize this. On further questioning he admits to a 5 year history of quiesced. ulcerative colitis. He is not in any treatment for this. He's had no recent travel 12/11/16; the patient arrives today with his wound and roughly the same condition we've been using silver alginate this is a deep punched out wound with some surrounding erythema but no tenderness. Biopsy I did did not show confirmed pyoderma gangrenosum suggested nonspecific inflammation and vasculitis but does not provide an actual description of what was seen by the pathologist. I'm really not able to understand this We have also received information from the patient's dermatologist in [place] notes from April 2016. This was a doctor  Agarwal-antal. 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 He was prescribed topical steroid high potency under occlusion which helped but at this point the patient did not have a deep punched out wound. 12/18/16; the patient's wound is larger in terms of surface area however this surface looks better and there is less depth. The surrounding erythema also is better. The patient states that the wrap we put on came off 2 days ago when he has been using his compression stockings. He we are in the process of getting a dermatology consult. 12/26/16 on evaluation today patient's left lower extremity wound shows evidence of infection with surrounding erythema noted. He has been tolerating the dressing changes but states that he has noted more discomfort. There is a larger area of erythema surrounding the wound. No fevers, chills, nausea, or vomiting noted at this time. With that being said the wound still does have slough covering the surface. He is not allergic to any medication that he is aware  of at this point. In regard to his right lower extremity he had several regions that are erythematous and pruritic he wonders if there's anything we can do to help that. 01/02/17 I reviewed patient's wound culture which was obtained his visit last week. He was placed on doxycycline at that point. Unfortunately that does not appear to be an antibiotic that would likely help with the situation however the pseudomonas noted on culture is sensitive to Cipro. Also unfortunately patient's wound seems to have a large compared to last week's evaluation. Not severely so but there are definitely increased measurements in general. He is continuing to have discomfort as well he writes this to be a seven out of 10. In fact he would prefer me not to perform any debridement today due to the fact that he is having discomfort and considering he has an active infection on the little reluctant to do so anyway. No fevers,  chills, nausea, or vomiting noted at this time. 01/08/17; patient seems dermatology on September 5. I suspect dermatology will want the slides from the biopsy I did sent to their pathologist. I'm not sure if there is a way we can expedite that. In any case the culture I did before I left on vacation 3 weeks ago showed Pseudomonas he was given 10 days of Cipro and per her description of her intake nurses is actually somewhat better this week although the wound is quite a bit bigger than I remember the last time I saw this. He still has 3 more days of Cipro 01/21/17; dermatology appointment tomorrow. He has completed the ciprofloxacin for Pseudomonas. Surface of the wound looks better however he is had some deterioration in the lesions on his right leg. Meantime the left lateral leg wound we will continue with sample 01/29/17; patient had his dermatology appointment but I can't yet see that note. He is completed his antibiotics. The wound is more superficial but considerably larger in circumferential area than when he came in. This is in his left lateral calf. He also has swollen erythematous areas with superficial wounds on the right leg and small papular areas on both arms. There apparently areas in her his upper thighs and buttocks I did not look at those. Dermatology biopsied the right leg. Hopefully will have their input next week. 02/05/17; patient went back to see his dermatologist who told him that he had a "scratching problem" as well as staph. He is now on a 30 day course of doxycycline and I believe she gave him triamcinolone cream to the right leg areas to help with the itching [not exactly sure but probably triamcinolone]. She apparently looked at the left lateral leg wound although this was not rebiopsied and I think felt to be ultimately part of the same pathogenesis. He is using sample border foam and changing nevus himself. He now has a new open area on the right posterior leg which was his  biopsy site I don't have any of the dermatology notes 02/12/17; we put the patient in compression last week with SANTYL to the wound on the left leg and the biopsy. Edema is much better and the depth of the wound is now at level of skin. Area is still the same Biopsy site on the right lateral leg we've also been using santyl with a border foam dressing and he is changing this himself. 02/19/17; Using silver alginate started last week to both the substantial left leg wound and the biopsy site on the right wound.  He is tolerating compression well. Has a an appointment with his primary M.D. tomorrow wondering about diuretics although I'm wondering if the edema problem is actually lymphedema NICANDRO, PERRAULT ([id number]) 02/26/17; the patient has been to see his primary doctor Dr. Jerrel Ivory at [facility] our primary care. She started him on Lasix 20 mg and this seems to have helped with the edema. However we are not making substantial change with the left lateral calf wound and inflammation. The biopsy site on the right leg also looks stable but not really all that different. 03/12/17; the patient has been to see vein and vascular Dr. Lucky Cowboy. He has had venous reflux studies I have not reviewed these. I did get a call from his dermatology office. 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 They therefore recommended clobetasol and tetracycline i.e. aggressive treatment for possible pyoderma gangrenosum. 03/26/17; apparently the patient just had reflux studies not an appointment with Dr. dew. She arrives in clinic today having applied clobetasol for 2-3 weeks. He notes over the last 2-3 days excessive drainage having to change the dressing 3-4 times  a day and also expanding erythema. 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 Lillia Carmel at [facility] dermatology locally who kindly saw him at my request. 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 My concern was the degree of erythema medially extending from his left leg wound which was either inflammation from pyoderma or cellulitis. I put him on Augmentin however culture of the wound showed Pseudomonas which is quinolone sensitive. I really don't believe he has cellulitis however in view of everything I will continue and give him a course of Cipro. He is also on doxycycline as an immune modulator for the pyoderma. In addition to his original wound on the left lateral leg with surrounding erythema he has a wound on the right posterior calf which was an original biopsy site done by dermatology. 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 Saw Dr. Lillia Carmel yesterday. He has been using topical antibiotics to both wound areas his original wound on the left and the biopsies/pathergy area on the right. There is definitely some improvement in the inflammation around the wound on the right although the patient states he has increasing sensitivity of the wounds. He is on prednisone 60 and doxycycline 1 as prescribed by Dr. Lillia Carmel. He is covering the topical antibiotic with gauze and putting this in his own compression stocks and changing this daily. He states that Dr. Lottie Rater did a culture of the left leg wound yesterday 05/07/17; pyoderma gangrenosum. The patient saw Dr. Lillia Carmel yesterday and has a follow-up with her in one month. He is  still using topical antibiotics to both wounds although he can't recall exactly what type. He is still on prednisone 60 mg. Dr. Lillia Carmel stated that the doxycycline could stop if we were in agreement. He has been using his own compression  stocks changing daily 06/11/17; pyoderma gangrenosum with wounds on the left lateral leg and right medial leg. The right medial leg was induced by biopsy/pathergy. The area on the right is essentially healed. 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 The right medial leg has closed and remains closed. He is still on prednisone 60. He tells me he missed his last dermatology appointment with Dr. Lillia Carmel but will make another appointment. He reports that her blood sugar at a recent screen in [place] was high 200's. He was 180 today. 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 I think he is going to require still much longer prednisone perhaps another 3 months before attempting to taper. In the meantime his wound is a lot better. Smaller. He is cleaning this off daily and applying topical antibiotics. When he was last in the clinic I thought about changing to [facility] and actually put in a couple of calls to dermatology although probably not during their business hours. 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 He continues on oral prednisone. He has been using triple antibiotic ointment. There is surface debris and we will transition to [facility] and have him return in 2 weeks. He has lost 30 pounds since his last appointment with lifestyle modification. He may benefit from topical steroid cream for treatment this can be considered at a later date. 08/22/17 on evaluation today patient appears to actually be doing rather well in regard to his  left lateral lower extremity ulcer. He has actually been managed by Dr. Dellia Nims most recently. Patient is currently on oral steroids at this time. This seems to have been of benefit for him. Nonetheless his last visit was actually with Leah on 08/06/17. Currently he is not utilizing any topical steroid creams although this could be of benefit as well. No fevers, chills, nausea, or vomiting noted at this time. 09/05/17 on evaluation today patient appears to be doing better in regard to his left lateral lower extremity ulcer. He has been tolerating the dressing changes without complication. He is using Santyl with good effect. Overall I'm very pleased with how things are standing at this point. Patient likewise is happy that this is doing better. 09/19/17 on evaluation today patient actually appears to be doing rather well in regard to his left lateral lower extremity ulcer. 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 He's not having any significant pain. 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 He has been tolerating the Santyl without complication and in general I feel like he's making good progress. 10/17/17 on evaluation today patient appears to be doing very well in regard to his left lateral lower surety ulcer. He has been tolerating the dressing changes without complication. There does not appear to be any evidence of infection he's alternating the Santyl and the triple antibiotic ointment every other day this seems to be doing well for him. 11/03/17 on evaluation today patient appears to be doing very well in regard to his left lateral lower extremity ulcer. He is been tolerating the dressing changes without complication which is good news. Fortunately  there does not appear to be any evidence of infection which is  also great news. Overall is doing excellent they are starting to taper down on the prednisone is down to 40 mg at this point it also started topical clobetasol for him. 11/17/17 on evaluation today patient appears to be doing well in regard to his left lateral lower surety ulcer. He's been tolerating the dressing changes without complication. He does note that he is having no pain, no excessive drainage or discharge, and overall he feels like things are going about how he would expect and hope they would. Overall he seems to have no evidence of infection at this time in my opinion which is good news. 12/04/17-He is seen in follow-up evaluation for right lateral lower extremity ulcer. He has been applying topical steroid cream. Today's measurement show slight increase in size. Over the next 2 weeks we will transition to every other day Santyl and steroid cream. He has been encouraged to monitor for changes and notify clinic with any concerns 12/15/17 on evaluation today patient's left lateral motion the ulcer and fortunately is doing worse again at this point. This just since last week to this week has close to doubled in size according to the patient. I did not seeing last week's I do not have a visual to compare this to in our system was also down so we do not have all the charts and at this point. Nonetheless it does have me somewhat concerned in regard to the fact that again he was worried enough about it he has contact the dermatology that placed them back on the full strength, 50 mg a day of the prednisone that he was taken previous. He continues to alternate using clobetasol along with Santyl at this point. He is obviously somewhat frustrated. [name], [name] ([id number]) 12/22/17 on evaluation today patient appears to be doing a little worse compared to last evaluation. Unfortunately the wound is a little deeper and slightly larger than the last week's evaluation. With that being said he has made some  progress in regard to the irritation surrounding at this time unfortunately despite that progress that's been made he still has a significant issue going on here. 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 For that reason I discussed with him today the possibility of perform a wound culture to ensure there's nothing overtly infected. 01/06/18 on evaluation today patient's wound is larger and deeper than previously evaluated. With that being said it did appear that his wound was infected after my last evaluation with him. Subsequently I did end up prescribing a prescription for Bactrim DS which she has been taking and having no complication with. Fortunately there does not appear to be any evidence of infection at this point in time as far as anything spreading, no want to touch, and overall I feel like things are showing signs of improvement. 01/13/18 on evaluation today patient appears to be even a little larger and deeper than last time. There still muscle exposed in the base of the wound. Nonetheless he does appear to be less erythematous I do believe inflammation is calming down also believe the infection looks like it's probably resolved at this time based on what I'm seeing. No fevers, chills, nausea, or vomiting noted at this time. 01/30/18 on evaluation today patient actually appears to visually look better for the  most part. Unfortunately those visually this looks better he does seem to potentially have what may be an abscess in the muscle that has been noted in the central portion of the wound. This is the first time that I have noted what appears to be fluctuance in the central portion of the muscle. With that being said I'm somewhat more concerned about the fact that this might indicate an abscess formation at this location. I do believe that  an ultrasound would be appropriate. This is likely something we need to try to do as soon as possible. He has been switch to mupirocin ointment and he is no longer using the steroid ointment as prescribed by dermatology he sees them again next week he's been decreased from 60 to 40 mg of prednisone. 03/09/18 on evaluation today patient actually appears to be doing a little better compared to last time I saw him. There's not as much erythema surrounding the wound itself. He I did review his most recent infectious disease note which was dated 02/24/18. He saw Dr. Michel Bickers in [place]. 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 Patient is now off of antibiotics and they are continually observing this. There seems to be no change in the past two weeks in my pinion based on what the patient says and what I see today compared to what Dr. Megan Salon likely saw two weeks ago. No fevers, chills, nausea, or vomiting noted at this time. 03/23/18 on evaluation today patient's wound actually appears to be showing signs of improvement which is good news. He is currently still on the Dapsone. He is also working on tapering the prednisone to get off of this and Dr. Lottie Rater is working with him in this regard. Nonetheless overall I feel like the wound is doing well it does appear based on the infectious disease note that I reviewed from Dr. 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 I did also review the results of his ultrasound of left lower extremity which revealed there was a dentist tissue in the base of the wound without an abscess noted. 04/06/18 on evaluation today the patient's left lateral lower extremity ulcer actually appears to be doing fairly well which is excellent news. There does not appear to be  any evidence of infection at this time which is also great news. Overall he still does have a significantly large ulceration although little by little he seems to be making progress. He is down to 10 mg a day of the prednisone. 04/20/18 on evaluation today patient actually appears to be doing excellent at this time in regard to his left lower extremity ulcer. He's making signs of good progress unfortunately this is taking much longer than we would really like to see but nonetheless he is making progress. Fortunately there does not appear to be any evidence of infection at this time. No fevers, chills, nausea, or vomiting noted at this time. The patient has not been using the Santyl due to the cost he hadn't got in this field yet. He's mainly been using the antibiotic ointment topically. Subsequently he also tells me that he really has not been scrubbing in the shower I think this would be helpful again as I told him it doesn't have to be anything too aggressive  to even make it believe just enough to keep it free of some of the loose slough and biofilm on the wound surface. 05/11/18 on evaluation today patient's wound appears to be making slow but sure progress in regard to the left lateral lower extremity ulcer. He is been tolerating the dressing changes without complication. Fortunately there does not appear to be any evidence of infection at this time. He is still just using triple antibiotic ointment along with clobetasol occasionally over the area. He never got the Santyl and really does not seem to intend to in my pinion. 06/01/18 on evaluation today patient appears to be doing a little better in regard to his left lateral lower extremity ulcer. He states that overall he does not feel like he is doing as well with the Dapsone as he did with the prednisone. Nonetheless he sees his dermatologist later today and is gonna talk to them about the possibility of going back on the prednisone. Overall again  I believe that the wound would be better if you would utilize Santyl but he really does not seem to be interested in going back to the [place] at this point. He has been using triple antibiotic ointment. 06/15/18 on evaluation today patient's wound actually appears to be doing about the same at this point. Fortunately there is no signs of infection at this time. He has made slight improvements although he continues to not really want to clean the wound bed at this point. He states that he just doesn't mess with it he doesn't want to cause any problems with everything else he has going on. He has been on medication, antibiotics as prescribed by his dermatologist, for a staff infection of his lower extremities which is really drying out now and looking much better he tells me. Fortunately there is no sign of overall infection. 06/29/18 on evaluation today patient appears to be doing well in regard to his left lateral lower surety ulcer all things considering. Fortunately his staff infection seems to be greatly improved compared to previous. He has no signs of infection and this is drying up quite nicely. He is still the doxycycline for this is no longer on cental, Dapsone, or any of the other medications. His dermatologist has recommended possibility of an infusion but right now he does not want to proceed with that. 07/13/18 on evaluation today patient appears to be doing about the same in regard to his left lateral lower surety ulcer. Fortunately there's no signs of infection at this time which is great news. Unfortunately he still builds up a significant amount of Slough/biofilm of the surface of the wound he still is not really cleaning this as he should be appropriately. Again I'm able to easily with saline and gauze remove the majority of this on the surface which if you would do this at home would likely be a dramatic improvement for him as far as getting the area to improve. Nonetheless overall I  still feel like he is making progress is just very slow. I think Santyl will be of benefit for him as well. Still he has not gotten this as of this point. 07/27/18 on evaluation today patient actually appears to be doing little worse in regards of the erythema around the periwound region of the wound he also tells me that he's been having more drainage currently compared to what he was experiencing last time I saw him. He states not quite as bad as what he had because this was  infected previously but nonetheless is still appears to be doing poorly. Fortunately there is no evidence of systemic infection at this point. The patient tells me that he is not going to be able to afford the Santyl. He is still waiting to hear about the infusion therapy with his dermatologist. Apparently she wants an updated colonoscopy first. [name], [name] ([id number]) 08/10/18 on evaluation today patient appears to be doing better in regard to his left lateral lower extremity ulcer. Fortunately he is showing signs of improvement in this regard he's actually been approved for Remicade infusion's as well although this has not been scheduled as of yet. Fortunately there's no signs of active infection at this time in regard to the wound although he is having some issues with infection of the right lower extremity is been seen as dermatologist for this. Fortunately they are definitely still working with him trying to keep things under control. 09/07/18 on evaluation today patient is actually doing rather well in regard to his left lateral lower extremity ulcer. He notes these actually having some hair grow back on his extremity which is something he has not seen in years. He also tells me that the pain is really not giving them any trouble at this time which is also good news overall she is very pleased with the progress he's using a combination of the mupirocin along with the probate is all mixed. 09/21/18 on evaluation today patient  actually appears to be doing fairly well all things considered in regard to his looks from the ulcer. He's been tolerating the dressing changes without complication. Fortunately there's no signs of active infection at this time which is good news he is still on all antibiotics or prevention of the staff infection. He has been on prednisone for time although he states it is gonna contact his dermatologist and see if she put them on a short course due to some irritation that he has going on currently. Fortunately there's no evidence of any overall worsening this is going very slow I think cental would be something that would be helpful for him although he states that $50 for tube is quite expensive. He therefore is not willing to get that at this point. 10/06/18 on evaluation today patient actually appears to be doing decently well in regard to his left lateral leg ulcer. He's been tolerating the dressing changes without complication. Fortunately there's no signs of active infection at this time. Overall I'm actually rather pleased with the progress he's making although it's slow he doesn't show any signs of infection and he does seem to be making some improvement. I do believe that he may need a switch up and dressings to try to help this to heal more appropriately and quickly. 10/19/18 on evaluation today patient actually appears to be doing better in regard to his left lateral lower extremity ulcer. This is shown signs of having much less Slough buildup at this point due to the fact he has been using the [employer]. Obviously this is very good news. The overall size of the wound is not dramatically smaller but again the appearance is. 11/02/18 on evaluation today patient actually appears to be doing quite well in regard to his lower [place] ulcer. A lot of the skin around the ulcer is actually somewhat irritating at this point this seems to be more due to the dressing causing irritation from the adhesive that  anything else. Fortunately there is no signs of active infection at this time. 11/24/18 on  evaluation today patient appears to be doing a little worse in regard to his overall appearance of his lower extremity ulcer. There's more erythema and warmth around the wound unfortunately. He is currently on doxycycline which he has been on for some time. With that being said I'm not sure that seems to be helping with what appears to possibly be an acute cellulitis with regard to his left lower extremity ulcer. No fevers, chills, nausea, or vomiting noted at this time. 12/08/18 on evaluation today patient's wounds actually appears to be doing significantly better compared to his last evaluation. He has been using Santyl along with alternating tripling about appointment as well as the steroid cream seems to be doing quite well and the wound is showing signs of improvement which is excellent news. Fortunately there's no evidence of infection and in fact his culture came back negative with only normal skin flora noted. 12/21/2018 upon evaluation today patient actually appears to be doing excellent with regard to his ulcer. This is actually the best that I have seen it since have been helping to take care of him. It is both smaller as well as less slough noted on the surface of the wound and seems to be showing signs of good improvement with new skin growing from the edges. He has been using just the triamcinolone he does wonder if he can get a refill of that ointment today. 01/04/2019 upon evaluation today patient actually appears to be doing well with regard to his left lateral lower extremity ulcer. With that being said it does not appear to be that he is doing quite as well as last time as far as progression is concerned. There does not appear to be any signs of infection or significant irritation which is good news. With that being said I do believe that he may benefit from switching to a collagen based dressing  based on how clean The wound appears. 01/18/2019 on evaluation today patient actually appears to be doing well with regard to his wound on the left lower extremity. He is not made a lot of progress compared to where we were previous but nonetheless does seem to be doing okay at this time which is good news. There is no signs of active infection which is also good news. My only concern currently is I do wish we can get him into utilizing the collagen dressing his insurance would not pay for the supplies that we ordered although it appears that he may be able to order this through his supply company that he typically utilizes. This is [employer]. Nonetheless he did try to order it during the office visit today and it appears this did go through. We will see if he can get that it is a different brand but nonetheless he has collagen and I do think will be beneficial. 02/01/2019 on evaluation today patient actually appears to be doing a little worse today in regard to the overall size of his wounds. Fortunately there is no signs of active infection at this time. That is visually. Nonetheless when this is happened before it was due to infection. For that reason were somewhat concerned about that this time as well. 02/08/2019 on evaluation today patient unfortunately appears to be doing slightly worse with regard to his wound upon evaluation today. Is measuring a little deeper and a little larger unfortunately. I am not really sure exactly what is causing this to enlarge he actually did see his dermatologist she is going to see  about initiating Humira for him. Subsequently she also did do steroid injections into the wound itself in the periphery. Nonetheless still nonetheless he seems to be getting a little bit larger he is gone back to just using the steroid cream topically which I think is appropriate. I would say hold off on the collagen for the time being is definitely a good thing to do. Based on the culture  results which we finally did get the final result back regarding it shows staph as the bacteria noted again that can be a normal skin bacteria based on the fact however he is having increased drainage and worsening of the wound measurement wise I would go ahead and place him on an antibiotic today I do believe for this. 02/15/2019 on evaluation today patient actually appears to be doing somewhat better in regard to his ulcer. There is no signs of worsening at this time I did review his culture results which showed evidence of Staphylococcus aureus but not MRSA. Again this could just be more related to the normal skin bacteria although he states the drainage has slowed down quite a bit he may have had a mild infection not just colonization. And was much smaller and then since around10/04/2019 on evaluation today patient appears to be doing unfortunately worse as far as the size of the wound. I really feel like that this is steadily getting larger again it had been doing excellent right at the beginning of September we have seen a steady increase in the area of the wound it is almost 2-1/2 times the size it was on September 1. Obviously this is a bad trend this is not wanting to see. For that reason we went back to using just the topical triamcinolone cream which does seem to help with inflammation. I checked him for bacteria by way of culture and nothing showed positive there. I am considering giving him a short course of a tapering steroid [name], [name] ([id number]) today to see if that is can be beneficial for him. The patient is in agreement with giving that a try. 03/08/2019 on evaluation today patient appears to be doing very well in comparison to last evaluation with regard to his lower extremity ulcer. This is showing signs of less inflammation and actually measuring slightly smaller compared to last time every other week over the past month and a half he has been measuring larger larger  larger. Nonetheless I do believe that the issue has been inflammation the prednisone does seem to have been beneficial for him which is good news. No fevers, chills, nausea, vomiting, or diarrhea. 03/22/2019 on evaluation today patient appears to be doing about the same with regard to his leg ulcer. He has been tolerating the dressing changes without complication. With that being said the wound seems to be mostly arrested at its current size but really is not making any progress except for when we prescribed the prednisone. He did show some signs of dropping as far as the overall size of the wound during that interval week. Nonetheless this is something he is not on long-term at this point and unfortunately I think he is getting need either this or else the Humira which his dermatologist has discussed try to get approval for. With that being said he will be seeing his dermatologist on the 11th of this month that is November. 04/19/2019 on evaluation today patient appears to be doing really about the same the wound is measuring slightly larger compared to last  time I saw him. He has not been into the office since November 2 due to the fact that he unfortunately had Covid as that his entire family. He tells me that it was rough but they did pull-through and he seems to be doing much better. Fortunately there is no signs of active infection at this time. No fevers, chills, nausea, vomiting, or diarrhea. 05/10/2019 on evaluation today patient unfortunately appears to be doing significantly worse as compared to last time I saw him. He does tell me that he has had his first dose of Humira and actually is scheduled to get the next one in the upcoming week. With that being said he tells me also that in the past several days he has been having a lot of issues with green drainage she showed me a picture this is more blue-green in color. He is also been having issues with increased sloughy buildup and the wound  does appear to be larger today. Obviously this is not the direction that we want everything to take based on the starting of his Humira. Nonetheless I think this is definitely a result of likely infection and to be honest I think this is probably Pseudomonas causing the infection based on what I am seeing. 05/24/2019 on evaluation today patient unfortunately appears to be doing significantly worse compared to his prior evaluation with me 2 weeks ago. I did review his culture results which showed that he does have Staph aureus as well as Pseudomonas noted on the culture. Nonetheless the Levaquin that I prescribed for him does not appear to have been appropriate and in fact he tells me he is no longer experiencing the green drainage and discharge that he had at the last visit. Fortunately there is no signs of active infection at this time which is good news although the wound has significantly worsened it in fact is much deeper than it was previous. We have been utilizing up to this point triamcinolone ointment as the prescription topical of choice but at this time I really feel like that the wound is getting need to be packed in order to appropriately manage this due to the deeper nature of the wound. Therefore something along the lines of an alginate dressing may be more appropriate. 05/31/2019 upon inspection today patient's wound actually showed signs of doing poorly at this point. Unfortunately he just does not seem to be making any good progress despite what we have tried. He actually did go ahead and pick up the Cipro and start taking that as he was noticing more green drainage he had previously completed the Levaquin that I prescribed for him as well. Nonetheless he missed his appointment for the seventh last week on Wednesday with the wound care center and [facility] where his dermatologist referred him. Obviously I do think a second opinion would be helpful at this point especially in light  of the fact that the patient seems to be doing so poorly despite the fact that we have tried everything that I really know how at this point. The only thing that ever seems to have helped him in the past is when he was on high doses of continual steroids that did seem to make a difference for him. 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 06/07/2019 upon  evaluation today patient unfortunately appears to be doing worse yet again with regard to his wound. In fact I am starting to question whether or not he may have a fluid pocket in the muscle at this point based on the bulging and the soft appearance to the central portion of the muscle area. There is not anything draining from the muscle itself at this time which is good news but nonetheless the wound is expanding. I am not really seeing any results of the Humira as far as overall wound progression based on what I am seeing at this point. The patient has been referred for second opinion with regard to his wound to the [facility] wound care center by his dermatologist which I definitely am not in opposition to. Unfortunately we tried multiple dressings in the past including collagen, alginate, and at one point even Hydrofera Blue. With that being said he is never really used it for any significant amount of time due to the fact that he often complains of pain associated with these dressings and then will go back to either using the Santyl which she has done intermittently or more frequently the triamcinolone. He is also using his own compression stockings. We have wrapped him in the past but again that was something else that he really was not a big fan of. 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 He seemed to do much better when he was on the steroids although honestly I understand that the steroids are not necessarily the best medication to be on long-term obviously 06/14/2019 on evaluation today patient appears to be doing actually a little bit better with regard to the overall appearance with his leg. Unfortunately he does continue to have issues with what appears to be some fluid underneath the muscle although he did see the wound specialty center at [facility] last week their main goals were to see about infusion therapy in place of the Humira as they feel like that is not quite strong enough. They also recommended that we continue with the treatment otherwise as we are they felt like that was appropriate and they are okay with him continuing to follow-up here with [place] in that regard. With that being said they are also sending him to the vein specialist there to see about vein stripping and if that would be of benefit for him. Subsequently they also did not really address whether or not an ultrasound of the muscle area to see if there is anything that needs to be addressed here would be appropriate or not. For that reason I discussed this with him last week I think we may proceed down that road at this point. 06/21/2019 upon evaluation today patient's wound actually appears to be doing slightly better compared to previous evaluations. I do believe that he has made a difference with regard to the progression here with the use of oral steroids. Again in the past has been the only thing that is really calm things down. He does tell me that from [facility] is gotten a good news from there that there are no further vein stripping that is necessary at this point.  I do not have that available for  review today although the patient did relay this to me. He also did obtain and have the ultrasound of the wound completed which I did sign off on today. It does appear that there is no fluid collection under the muscle this is likely then just edematous tissue in general. That is also good news. Overall I still believe the inflammation is the main issue here. He did inquire about the possibility of a wound VAC again with the muscle protruding like it is I am not really sure whether the wound VAC is necessarily ideal or not. That is something we will have to consider although I do believe he may need compression wrapping to try to help with edema control which could potentially be of benefit. 06/28/2019 on evaluation today patient appears to be doing slightly better measurement wise although this is not terribly smaller he least seems to be trending towards that direction. With that being said he still seems to have purulent drainage noted in the wound bed at this time. He has been on Levaquin followed by Cipro over the past month. Unfortunately he still seems to have some issues with active infection at this time. I did perform a culture last week in order to evaluate and see if indeed there was still anything going on. Subsequently the culture did come back Aicher, Carols ([id number]) showing Pseudomonas which is consistent with the drainage has been having which is blue-green in color. He also has had an odor that again was somewhat consistent with Pseudomonas as well. Long story short it appears that the culture showed an intermediate finding with regard to how well the Cipro will work for the Pseudomonas infection. Subsequently being that he does not seem to be clearing up and at best what we are doing is just keeping this at [place] I think he may need to see infectious disease to discuss IV antibiotic options. 07/05/2019 upon evaluation today patient appears to be doing okay in regard to his leg ulcer.  He has been tolerating the dressing changes at this point without complication. Fortunately there is no signs of active infection at this time which is good news. No fevers, chills, nausea, vomiting, or diarrhea. With that being said he does have an appointment with infectious disease tomorrow and his primary care on Wednesday. Again the reason for the infectious disease referral was due to the fact that he did not seem to be fully resolving with the use of oral antibiotics and therefore we were thinking that IV antibiotic therapy may be necessary secondary to the fact that there was an intermediate finding for how effective the Cipro may be. Nonetheless again he has been having a lot of purulent and even green drainage. Fortunately right now that seems to have calmed down over the past week with the reinitiation of the oral antibiotic. Nonetheless we will see what Dr. Megan Salon has to say. 07/12/2019 upon evaluation today patient appears to be doing about the same at this point in regard to his left lower extremity ulcer. Fortunately there is no signs of active infection at this time which is good news I do believe the Levaquin has been beneficial I did review Dr. Hale Bogus note and to be honest I agree that the patient's leg does appear to be doing better currently. What we found in the past as he does not seem to really completely resolve he will stop the antibiotic and then subsequently things will revert back  to having issues with blue-green drainage, increased pain, and overall worsening in general. Obviously that is the reason I sent him back to infectious disease. 07/19/2019 upon evaluation today patient appears to be doing roughly the same in size there is really no dramatic improvement. He has started back on the Levaquin at this point and though he seems to be doing okay he did still have a lot of blue/green drainage noted on evaluation today unfortunately. I think that this is still indicative  more likely of a Pseudomonas infection as previously noted and again he does see Dr. Megan Salon in just a couple of days. I do not know that were really able to effectively clear this with just oral antibiotics alone based on what I am seeing currently. Nonetheless we are still continue to try to manage as best we can with regard to the patient and his wound. I do think the wrap was helpful in decreasing the edema which is excellent news. No fevers, chills, nausea, vomiting, or diarrhea. 07/26/2019 upon evaluation today patient appears to be doing slightly better with regard to the overall appearance of the muscle there is no dark discoloration centrally. Fortunately there is no signs of active infection at this time. No fevers, chills, nausea, vomiting, or diarrhea. Patient's wound bed currently the patient did have an appointment with Dr. Megan Salon at infectious disease last week. With that being said Dr. Megan Salon the patient states was still somewhat hesitant about put him on any IV antibiotics he wanted [place] to repeat cultures today and then see where things go going forward. He does look like Dr. Megan Salon because of some improvement the patient did have with the Levaquin wanted [place] to see about repeating cultures. If it indeed grows the Pseudomonas again then he recommended a possibility of considering a PICC line placement and IV antibiotic therapy. He plans to see the patient back in 1 to 2 weeks. 08/02/2019 upon evaluation today patient appears to be doing poorly with regard to his left lower extremity. We did get the results of his culture back it shows that he is still showing evidence of Pseudomonas which is consistent with the purulent/blue-green drainage that he has currently. Subsequently the culture also shows that he now is showing resistance to the oral fluoroquinolones which is unfortunate as that was really the only thing to treat the infection prior. I do believe that he is looking like this  is going require IV antibiotic therapy to get this under control. Fortunately there is no signs of systemic infection at this time which is good news. The patient does see Dr. Megan Salon tomorrow. 08/09/2019 upon evaluation today patient appears to be doing better with regard to his left lower extremity ulcer in regard to the overall appearance. He is currently on IV antibiotic therapy. As ordered by Dr. Megan Salon. Currently the patient is on ceftazidime which she is going to take for the next 2 weeks and then follow-up for 4 to 5-week appointment with Dr. Megan Salon. The patient started this this past Friday symptoms have not for a total of 3 days currently in full. 08/16/2019 upon evaluation today patient's wound actually does show muscle in the base of the wound but in general does appear to be much better as far as the overall evidence of infection is concerned. In fact I feel like this is for the most part cleared up he still on the IV antibiotics he has not completed the full course yet but I think he is doing  much better which is excellent news. 08/23/2019 upon evaluation today patient appears to be doing about the same with regard to his wound at this point. He tells me that he still has pain unfortunately. Fortunately there is no evidence of systemic infection at this time which is great news. There is significant muscle protrusion. 09/13/19 upon evaluation today patient appears to be doing about the same in regard to his leg unfortunately. He still has a lot of drainage coming from the ulceration there is still muscle exposed. With that being said the patient's last wound culture still showed an intermediate finding with regard to the Pseudomonas he still having the bluish/green drainage as well. Overall I do not know that the wound has completely cleared of infection at this point. Fortunately there is no signs of active infection systemically at this point which is good news. 09/20/2019 upon  evaluation today patient's wound actually appears to be doing about the same based on what I am seeing currently. I do not see any signs of systemic infection he still does have evidence of some local infection and drainage. He did see Dr. Megan Salon last week and Dr. Megan Salon states that he probably does need a different IV antibiotic although he does not want to put him on this until the patient begins the Remicade infusion which is actually scheduled for about 10 days out from today on 13 May. Following that time Dr. Megan Salon is good to see him back and then will evaluate the feasibility of starting him on the IV antibiotic therapy once again at that point. I do not disagree with this plan I do believe as Dr. Megan Salon stated in his note that I reviewed today that the patient's issue is multifactorial with the pyoderma being 1 aspect of this that were hoping the Remicade will be helpful for her. In the meantime I think the gentamicin is, helping to keep things under decent okay control in regard to the ulcer. 09/27/2019 upon evaluation today patient appears to be doing about the same with regard to his wound still there is a lot of muscle exposure though he does have some hyper granulation tissue noted around the edge and actually some granulation tissue starting to form over the muscle which is actually good news. Fortunately there is no evidence of active infection which is also good news. His pain is less at this point. 5/21; this is a patient I have not seen in a long time. 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 He has a large wound on the left lateral leg with protruding muscle. He comes in the clinic today showing the same area on his left medial ankle. He says there is been a spot there for some time although we have not previously defined this. Today he has a clearly defined area with slight amount of skin breakdown surrounded by raised areas with a  purplish hue in color. This is not painful he says it is irritated. 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 He still has muscle protruding from the lateral portion of his left leg but fortunately the new area that they were concerned about at his last visit does not appear to have opened at this point. He is currently on Remicade infusions and seems to be doing better in my opinion in fact the wound itself seems to be overall much better. The purplish discoloration  that he did have seems to have resolved and I think that is a good sign that hopefully the Remicade is doing its job. He does Lamp, Kadin ([id number]) have some biofilm noted over the surface of the wound. 11/01/2019 on evaluation today patient's wound actually appears to be doing excellent at this time. Fortunately there is no evidence of active infection and overall I feel like he is making great progress. The Remicade seems to be due excellent job in my opinion. 11/08/19 evaluation today vision actually appears to be doing quite well with regard to his weight ulcer. He's been tolerating dressing changes without complication. Fortunately there is no evidence of infection. No fevers, chills, nausea, or vomiting noted at this time. Overall states that is having more itching than pain which is actually a good sign in my opinion. 12/13/2019 upon evaluation today patient appears to be doing well today with regard to his wound. He has been tolerating the dressing changes without complication. Fortunately there is no sign of active infection at this time. No fevers, chills, nausea, vomiting, or diarrhea. Overall I feel like the infusion therapy has been very beneficial for him. 01/06/2020 on evaluation today patient appears to be doing well with regard to his wound. This is measuring smaller and actually looks to be doing better.  Fortunately there is no signs of active infection at this point. No fevers, chills, nausea, vomiting, or diarrhea. With that being said he does still have the blue-green drainage but this does not seem to be causing any significant issues currently. He has been using the gentamicin that does seem to be keeping things under decent control at this point. He goes later this morning for his next infusion therapy for the pyoderma which seems to also be very beneficial. 02/07/2020 on evaluation today patient appears to be doing about the same in regard to his wounds currently. Fortunately there is no signs of active infection systemically he does still have evidence of local infection still using gentamicin. He also is showing some signs of improvement albeit slowly I do feel like we are making some progress here. 02/21/2020 upon evaluation today patient appears to be making some signs of improvement the wound is measuring a little bit smaller which is great news and overall I am very pleased with where he stands currently. He is going to be having infusion therapy treatment on the 15th of this month. Fortunately there is no signs of active infection at this time. 03/13/2020 I do believe patient's wound is actually showing some signs of improvement here which is great news. He has continue with the infusion therapy through rheumatology/dermatology at [facility]. That does seem to be beneficial. I still think he gets as much benefit from this as he did from the prednisone initially but nonetheless obviously this is less harsh on his body that the prednisone as far as they are concerned. 03/31/2020 on evaluation today patient's wound actually showing signs of some pretty good improvement in regard to the overall appearance of the wound bed. There is still muscle exposed though he does have some epithelial growth around the edges of the wound. Fortunately there is no signs of active infection at this time. No fevers,  chills, nausea, vomiting, or diarrhea. 04/24/2020 upon evaluation today patient appears to be doing about the same in regard to his leg ulcer. He has been tolerating the dressing changes without complication. Fortunately there is no signs of active infection at this time. No fevers, chills, nausea, vomiting,  or diarrhea. With that being said he still has a lot of irritation from the bandaging around the edges of the wound. We did discuss today the possibility of a referral to plastic surgery. 05/22/2020 on evaluation today patient appears to be doing well with regard to his wounds all things considered. He has not been able to get the Chantix apparently there is a recall nurse that I was unaware of put out by [employer] involuntarily. Nonetheless for now I am and I have to do some research into what may be the best option for him to help with quitting in regard to smoking and we discussed that today. 06/26/2020 upon evaluation today patient appears to be doing well with regard to his wound from the standpoint of infection I do not see any signs of infection at this point. With that being said unfortunately he is still continuing to have issues with muscle exposure and again he is not having a whole lot of new skin growth unfortunately. There does not appear to be any signs of active infection at this time. No fevers, chills, nausea, vomiting, or diarrhea. 07/10/2020 upon evaluation today patient appears to be doing a little bit more poorly currently compared to where he was previous. I am concerned currently about an active infection that may be getting worse especially in light of the increased size and tenderness of the wound bed. No fevers, chills, nausea, vomiting, or diarrhea. 07/24/2020 upon evaluation today patient appears to be doing poorly in regard to his leg ulcer. He has been tolerating the dressing changes without complication but unfortunately is having a lot of discomfort. Unfortunately the  patient has an infection with Pseudomonas resistant to gentamicin as well as fluoroquinolones. Subsequently I think he is going require possibly IV antibiotics to get this under control. I am very concerned about the severity of his infection and the amount of discomfort he is having. 07/31/2020 upon evaluation today patient appears to be doing about the same in regard to his leg wound. He did see Dr. Megan Salon and Dr. Megan Salon is actually going to start him on IV antibiotics. He goes for the PICC line tomorrow. With that being said there do not have that run for 2 weeks and then see how things are doing and depending on how he is progressing they may extend that a little longer. Nonetheless I am glad this is getting ready to be in place and definitely feel it may help the patient. In the meantime is been using mainly triamcinolone to the wound bed has an anti-inflammatory. 08/07/2020 on evaluation today patient appears to be doing well with regard to his wound compared even last week. In the interim he has gotten the PICC line placed and overall this seems to be doing excellent. There does not appear to be any evidence of infection which is great news systemically although locally of course has had the infection this appears to be improving with the use of the antibiotics. 08/14/2020 upon evaluation today patient's wound actually showing signs of excellent improvement. Overall the irritation has significantly improved the drainage is back down to more of a normal level and his pain is really pretty much nonexistent compared to what it was. Obviously I think that this is significantly improved secondary to the IV antibiotic therapy which has made all the difference in the world. Again he had a resistant form of Pseudomonas for which oral antibiotics just was not cutting it. Nonetheless I do think that still we need  to consider the possibility of a surgical closure for this wound is been open so long and  to be honest with muscle exposed I think this can be very hard to get this to close outside of this although definitely were still working to try to do what we can in that regard. 08/21/2020 upon evaluation today patient appears to be doing very well with regard to his wounds on the left lateral lower extremity/calf area. Fortunately there does not appear to be signs of active infection which is great news and overall very pleased with where things stand today. He is actually wrapping up his treatment with IV antibiotics tomorrow. After that we will see where things go from there. 08/28/2020 upon evaluation today patient appears to be doing decently well with regard to his leg ulcer. There does not appear to be any signs of Culbreth, Kurt ([id number]) active infection which is great news and overall very pleased with where things stand today. No fevers, chills, nausea, vomiting, or diarrhea. 09/18/2020 upon evaluation today patient appears to be doing well with regard to his infection which I feel like is better. Unfortunately he is not doing as well with regard to the overall size of the wound which is not nearly as good at this point. I feel like that he may be having an issue here with the pyoderma being somewhat out of control. I think that he may benefit from potentially going back and talking to the dermatologist about what to do from the pyoderma standpoint. I am not certain if the infusions are helping nearly as much is what the prednisone did in the past. 10/02/2020 upon evaluation today patient appears to be doing well with regard to his leg ulcer. He did go to the Psychiatric nurse. Unfortunately they feel like there is a 10% chance that most that he would be able to heal and that the skin graft would take. Obviously this has led him to not be able to go down that path as far as treatment is concerned. Nonetheless he does seem to be doing a little bit better with the prednisone that I gave him last  time. I think that he may need to discuss with dermatology the possibility of long-term prednisone as that seems to be what is most helpful for him to be perfectly honest. I am not sure the Remicade is really doing the job. 10/17/2020 upon evaluation today patient appears to be doing a little better in regard to his wound. In fact the case has been since we did the prednisone on May 2 for him that we have noticed a little bit of improvement each time we have seen a size wise as well as appearance wise as well as pain wise. I think the prednisone has had a greater effect then the infusion therapy has to be perfectly honest. With that being said the patient also feels significantly better compared to what he was previous. All of this is good news but nonetheless I am still concerned about the fact that again we are really not set up to long-term manage him as far as prednisone is concerned. Obviously there are things that you need to be watched I completely understand the risk of prednisone usage as well. 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 With all that being said I do believe that we can give him another round of the prednisone which she is requesting today because of  the improvement that he seen since we did that first round. 10/30/2020 upon evaluation today patient's wound actually is showing signs of doing quite well. There does not appear to be any evidence of infection which is great news and overall very pleased with where things stand today. No fevers, chills, nausea, vomiting, or diarrhea. He tells me that the prednisone still has seem to have helped he wonders if we can extend that for just a little bit longer. He did not have the appointment with a dermatologist although he did have an infusion appointment last Friday. That was at [facility]. With that being said he tells me he could not do both that as well as the appointment with the physician on  the same day therefore that is can have to be rescheduled. I really want to see if there is anything they feel like that could be done differently to try to help this out as I am not really certain that the infusions are helping significantly here. 11/13/2020 upon evaluation today patient unfortunately appears to be doing somewhat poorly in regard to his wound I feel like this is actually worsening from the standpoint of the pyoderma spreading. I still feel like that he may need something different as far as trying to manage this going forward. Again we did the prednisone unfortunately his blood sugars are not doing so well because of this. Nonetheless I believe that the patient likely needs to try topical steroid. We have done triamcinolone for a while I think going with something stronger such as clobetasol could be beneficial again this is not something I do lightly I discussed this with the patient that again this does not normally put underneath an occlusive dressing. Nonetheless I think a thin film as such could help with some of the stronger anti-inflammatory effects. We discussed this today. He would like to try to give this a trial for the next couple weeks. I definitely think that is something that we can do. Evaluate7/03/2021 and today patient's wound bed actually showed signs of doing really about the same. There was a little expansion of the size of the wound and that leading edge that we done looking out although the clobetasol does seem to have slowed this down a bit in my opinion. There is just 1 small area that still seems to be progressing based on what I see. Nonetheless I am concerned about the fact this does not seem to be improving if anything seems to be doing a little bit worse. I do not know that the infusions are really helping him much as next infusion is August 5 his appointment with dermatology is July 25. Either way I really think that we need to have a conversation  potentially about this and I am actually going to see if I can talk with Dr. Lillia Carmel in order to see where things stand as well. 12/11/2020 upon evaluation today patient appears to be doing worse in regard to his leg ulcer. Unfortunately I just do not think this is making the progress that I would like to see at this point. Honestly he does have an appointment with dermatology and this is in 2 days. I am wondering what they may have to offer to help with this. Right now what I am seeing is that he is continuing to show signs of worsening little by little. Obviously that is not great at all. Is the exact opposite of what we are looking for. 12/18/2020 upon evaluation today patient  appears to be doing a little better in regard to his wound. The dermatologist actually did do some steroid injections into the wound which does seem to have been beneficial in my opinion. That was on the 25th already this looks a little better to me than last time I saw him. With that being said we did do a culture and this did show that he has Staph aureus noted in abundance in the wound. With that being said I do think that getting him on an oral antibiotic would be appropriate as well. Also think we can compression wrap and this will make a difference as well. 12/28/2020 upon evaluation today patient's wound is actually showing signs of doing much better. I do believe the compression wrap is helping he has a lot of drainage but to be honest I think that the compression is helping to some degree in this regard as well as not draining through which is also good news. No fevers, chills, nausea, vomiting, or diarrhea. 01/04/2021 upon evaluation today patient appears to be doing well with regard to his wound. Overall things seem to be doing quite well. He did have a little bit of reaction to the CarboFlex Sorbact he will be using that any longer. With that being said he is controlled as far as the drainage is concerned overall  and seems to be doing quite well. I do not see any signs of active infection at this time which is great news. No fevers, chills, nausea, vomiting, or diarrhea. 01/11/2021 upon evaluation today patient appears to be doing well with regard to his wounds. He has been tolerating the dressing changes without complication. Fortunately there does not appear to be any signs of active infection at this time which is great news. Overall I am extremely pleased with where we stand currently. No fevers, chills, nausea, vomiting, or diarrhea. Where using clobetasol in the wound bed he has a lot of new skin growth which is awesome as well. 01/18/2021 upon evaluation today patient appears to be doing very well in regard to his leg ulcer. He has been tolerating the dressing changes without complication. Fortunately there does not appear to be any signs of active infection which is great news. In general I think that he is making excellent progress 01/25/2021 upon evaluation today patient appears to be doing well with regard to his wound on the leg. I am actually extremely pleased with where things stand today. There does not appear to be any signs of active infection which is great news and overall I think that we are definitely headed in the appropriate direction based on what I am seeing currently. There does not appear to be any signs of active infection also excellent news. 02/06/2021 upon evaluation today patient appears to be doing well with regard to his wound. Overall visually this is showing signs of significant Wible, Sudeep ([id number]) improvement which is great news. I do not see any signs of active infection systemically which is great even locally I do not think that we are seeing any major complications here. We did do fluorescence imaging with the [employer] DX today. The patient does have some odor and drainage noted and again this is something that I think would benefit him to probably come more  frequently for nurse visits. 02/19/2021 upon evaluation today patient actually appears to be doing quite well in regard to his wound. He has been tolerating the dressing changes without complication and overall I think that this is making  excellent progress. I do not see any evidence of active infection at this point which is great news as well. No fevers, chills, nausea, vomiting, or diarrhea. 10/10; wound is made nice progress healthy granulation with a nice rim of epithelialization which seems to be expanding even from last week he has a deeper area in the inferior part of the more distal part of the wound with not quite as healthy as surface. This area will need to be followed. Using clobetasol and Hydrofera Blue 03/05/2021 upon evaluation today patient appears to be doing very well in regard to his leg ulcer. He has been tolerating dressing changes without complication. Fortunately there does not appear to be any signs of active infection which is great news and overall I am extremely pleased with where we stand currently. 03/12/2021 upon evaluation today patient appears to be doing well with regard to his wound in fact this is extremely extremely good based on what we are seeing today there does not appear to be any signs of active infection and overall I think that he is doing awesome from the standpoint of healing in general. I am extremely pleased with how things seem to be progressing with regard to this pyoderma. Clobetasol has done wonders for him. I think the compression wrapping has also been of great benefit. 03/19/2021 upon evaluation today patient appears to be doing well with regard to his wound. He is tolerating the dressing changes without complication. In fact I feel like that he is actually making excellent progress at this point based on what I am seeing. No fevers, chills, nausea, vomiting, or diarrhea. 03/26/2021 upon evaluation today patient appears to be doing well with  regard to his wound. This again is measuring smaller and looking better. Again the progress is slow but nonetheless continual with what we have been seeing. I do believe that the current plan is doing awesome for him. 04/09/2021 upon evaluation today patient appears to be doing well with regard to his leg ulcer. This is showing signs of excellent improvement the muscle is completely closed over and there does not appear to be any evidence of inflammation at this point his drainage is significantly improved. Overall I think that he would be a good candidate for looking into a skin substitute at this point as well. We will get a look into some approvals in that regard. Potentially TheraSkin as well as Apligraf could both be considered just depending on insurance coverage. 04/16/2021 upon evaluation today patient appears to be doing well at this point. He has been approved for the Apligraf which we could definitely order although I would like to try to get the TheraSkin approved if at all possible. I did fax notes into them today and I Georgina Peer try to give a call as well. Overall the wound appears to be doing decently well today. 04/20/2021 upon evaluation today patient actually appears to be doing quite well in regard to his wound with that being said we are trying to see what we can do about speeding up the healing process. For that reason we did discuss the possibility of a skin substitute. We got the Apligraf approved. For that reason we will get a go ahead and see what we can do with the Apligraf at this point. I am still trying to get the TheraSkin approved but I have not heard anything from the insurance company yet I have called and talked to them earlier in the week and to be honest this  was about half an hour that I spent on the phone they told me I would hear something within 10-15 business days. 04/27/2021 upon evaluation today patient appears to be doing excellent in regard to his wound. I do  believe the Apligraf has been beneficial. With that being said he has had a little bit of increased pain has been a little bit concerned. I do want to go ahead and apply his steroid cream today the clobetasol and then put the Apligraf over I just do not think I want to risk not putting the clobetasol on based on what is been going on here currently. Patient voiced understanding. Objective Constitutional Well-nourished and well-hydrated in no acute distress. Vitals Time Taken: 8:08 AM, Height: 71 in, Weight: 338 lbs, BMI: 47.1, Temperature: 98.3 F, Pulse: 84 bpm, Respiratory Rate: 18 breaths/min, Blood Pressure: 138/82 mmHg. Respiratory normal breathing without difficulty. Psychiatric this patient is able to make decisions and demonstrates good insight into disease process. Alert and Oriented x 3. pleasant and cooperative. General Notes: Upon inspection patient's wound bed actually showed signs of good granulation and epithelization I did not perform any debridement although mechanically I did clear away some of the slough and biofilm before applying the new Apligraf he tolerated that today without complication this is application #2 of the Apligraf. Integumentary (Hair, Skin) Wound #1 status is Open. Original cause of wound was Gradually Appeared. The date acquired was: 11/18/2015. The wound has been in treatment Guhl, Shiloh ([id number]) 229 weeks. The wound is located on the Left,Lateral Lower Leg. The wound measures 4.3cm length x 2cm width x 0.2cm depth; 6.754cm^2 area and 1.351cm^3 volume. There is Fat Layer (Subcutaneous Tissue) exposed. There is no tunneling or undermining noted. There is a medium amount of serosanguineous drainage noted. The wound margin is thickened. There is large (67-100%) red, hyper - granulation within the wound bed. There is no necrotic tissue within the wound bed. 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 A skin graft procedure using a bioengineered skin substitute/cellular or tissue based product was performed by Tommie Sams., PA-C with the following instrument(s): Blade, Forceps, and Scissors. Apligraf was applied and secured with Steri-Strips. 22 sq cm of product was utilized and 22 sq cm was wasted due to wound size. Post Application, adaptic was applied. A Time Out was conducted at 08:27, prior to the start of the procedure. The procedure was tolerated well with a pain level of 0 throughout and a pain level of 0 following the procedure. Post procedure Diagnosis Wound #1: Same as Pre-Procedure . Plan Follow-up Appointments: Return Appointment in 1 week. Nurse Visit as needed - twice a week Bathing/ Shower/ Hygiene: Clean wound with Normal Saline or wound cleanser. Cellular or Tissue Based Products: Cellular or Tissue Based Product Type: - APLIGRAF #2 Cellular or Tissue Based Product applied to wound bed; including contact layer, fixation with steri-strips, dry gauze and cover dressing. (DO NOT REMOVE). Edema Control - Lymphedema / Segmental Compressive Device / Other: Optional: One layer of unna paste to top of compression wrap (to act as an anchor). Elevate, Exercise Daily and Avoid Standing for Long Periods of Time. Elevate legs to the level of the heart and pump ankles as often as  possible Elevate leg(s) parallel to the floor when sitting. WOUND #1: - Lower Leg Wound Laterality: Left, Lateral Cleanser: Wound Cleanser 3 x Per Week/30 Days Discharge Instructions: Wash your hands with soap and water. Remove old dressing, discard into plastic bag and place into trash. Cleanse the wound  with Wound Cleanser prior to applying a clean dressing using gauze sponges, not tissues or cotton balls. Do not scrub or use excessive force. Pat dry using gauze sponges, not tissue or cotton balls. Peri-Wound Care: Moisturizing Lotion 3 x Per Week/30 Days Discharge Instructions: Suggestions: Theraderm, Eucerin, Cetaphil, or patient preference. Secondary Dressing: Xtrasorb Medium 4x5 (in/in) 3 x Per Week/30 Days Discharge Instructions: Apply to wound as directed. Do not cut. Compression Wrap: Medichoice 4 layer Compression System, 35-40 mmHG (Generic) 3 x Per Week/30 Days Discharge Instructions: Apply multi-layer wrap as directed. 1. I would recommend currently that we go ahead and continue with the Apligraf today is application #2 we will see how things continue to proceed. Overall I feel like there was good improvement although I did use the clobetasol down first before applying the Apligraf today. 2. I am also can recommend that we continue with a 4-layer compression wrap which is doing a great job. HALVOR, BEHREND ([id number]) We will see patient back for reevaluation in 1 week here in the clinic. If anything worsens or changes patient will contact our office for additional recommendations. Electronic Signature(s) Signed: 04/27/2021 6:00:08 PM By: Worthy Keeler PA-C Entered By: Worthy Keeler on 04/27/2021 18:00:08 SCHUYLER, OLDEN ([id number]) -------------------------------------------------------------------------------- SuperBill Details Patient Name: [name] Date of Service: 04/27/2021 Medical Record Number: [id number] Patient Account Number: [id number] Date of Birth/Sex: 12/24/1980 (40 y.o. M) Treating RN: Carlene Coria Primary Care Provider: [name] Other Clinician: Referring Provider: [name] Treating Provider/Extender: [name] Cliche in Treatment: 229 Diagnosis Coding ICD-10 Codes Code Description I87.2 Venous insufficiency (chronic) (peripheral) L97.222  Non-pressure chronic ulcer of left calf with fat layer exposed E11.622 Type 2 diabetes mellitus with other skin ulcer L88 Pyoderma gangrenosum F17.208 Nicotine dependence, unspecified, with other nicotine-induced disorders Facility Procedures CPT4 Code: [id number] Description: [phone] (Facility Use Only) Apligraf 44 SQ CM Modifier: Quantity: [place] Code: [id number] Description: 45997 - SKIN SUB GRAFT TRNK/ARM/LEG Modifier: Quantity: 1 CPT4 Code: Description: ICD-10 Diagnosis Description L97.222 Non-pressure chronic ulcer of left calf with fat layer exposed Modifier: Quantity: Physician Procedures CPT4 Code: [id number] Description: 53202 - WC PHYS SKIN SUB GRAFT TRNK/ARM/LEG Modifier: Quantity: 1 CPT4 Code: Description: ICD-10 Diagnosis Description L97.222 Non-pressure chronic ulcer of left calf with fat layer exposed Modifier: Quantity: Electronic Signature(s) Signed: 04/27/2021 6:00:38 PM By: Worthy Keeler PA-C Previous Signature: 04/27/2021 4:34:35 PM Version By: Carlene Coria RN Entered By: Worthy Keeler on 04/27/2021 18:00:38

## 2021-04-30 ENCOUNTER — Other Ambulatory Visit: Payer: Self-pay

## 2021-04-30 DIAGNOSIS — E11622 Type 2 diabetes mellitus with other skin ulcer: Secondary | ICD-10-CM | POA: Diagnosis not present

## 2021-04-30 NOTE — Progress Notes (Signed)
MITUL, HALLOWELL ([id number]) Visit Report for 04/27/2021 Arrival Information Details Patient Name: [name], [name] Date of Service: 04/27/2021 8:00 AM Medical Record Number: [id number] Patient Account Number: [id number] Date of Birth/Sex: Nov 10, 1980 (40 y.o. M) Treating RN: Carlene Coria Primary Care Ermias Tomeo: [name] Other Clinician: Referring Alyzae Hawkey: [name] Treating Camarie Mctigue/Extender: [name] Cliche in Treatment: 67 Visit Information History Since Last Visit All ordered tests and consults were completed: No Patient Arrived: Ambulatory Added or deleted any medications: No Arrival Time: 08:05 Any new allergies or adverse reactions: No Accompanied By: self Had a fall or experienced change in No Transfer Assistance: None activities of daily living that may affect Patient Identification Verified: Yes risk of falls: Secondary Verification Process Completed: Yes Signs or symptoms of abuse/neglect since last visito No Patient Requires Transmission-Based No Hospitalized since last visit: No Precautions: Implantable device outside of the clinic excluding No Patient Has Alerts: Yes cellular tissue based products placed in the center Patient Alerts: Patient has reaction since last visit: to Has Dressing in Place as Prescribed: Yes silver dressings. Has Compression in Place as Prescribed: Yes Pain Present Now: No Electronic Signature(s) Signed: 04/30/2021 12:20:53 PM By: Carlene Coria RN Entered By: Carlene Coria on 04/27/2021 08:08:27 [name] ([id number]) -------------------------------------------------------------------------------- Clinic Level of Care Assessment Details Patient Name: [name] Date of Service: 04/27/2021 8:00 AM Medical Record Number: [id number] Patient Account Number: [id number] Date of Birth/Sex: 11/27/1980 (40 y.o. M) Treating RN: Carlene Coria Primary Care Blain Hunsucker: [name] Other Clinician: Referring Aunya Lemler: [name] Treating Sophronia Varney/Extender: [name] Cliche in Treatment: 229 Clinic Level of Care Assessment Items TOOL 1 Quantity Score []  - Use when EandM and Procedure is performed on INITIAL visit 0 ASSESSMENTS - Nursing Assessment / Reassessment []  - General Physical Exam (combine w/ comprehensive assessment (listed just below) when performed on new 0 pt. evals) []  - 0 Comprehensive Assessment (HX, ROS, Risk Assessments, Wounds Hx, etc.) ASSESSMENTS - Wound and Skin Assessment / Reassessment []  - Dermatologic / Skin Assessment (not related to wound area) 0 ASSESSMENTS - Ostomy and/or Continence Assessment and Care []  - Incontinence Assessment and Management 0 []  - 0 Ostomy Care Assessment and Management (repouching, etc.) PROCESS - Coordination of Care []  - Simple Patient / Family Education for ongoing care 0 []  - 0 Complex (extensive) Patient / Family Education for ongoing care []  - 0 Staff obtains Programmer, systems, Records, Test Results / Process Orders []  - 0 Staff telephones HHA, Nursing Homes / Clarify orders / etc []  - 0 Routine Transfer to another Facility (non-emergent condition) []  - 0 Routine Hospital Admission (non-emergent condition) []  - 0 New Admissions / Biomedical engineer / Ordering NPWT, Apligraf, etc. []  - 0 Emergency Hospital Admission (emergent condition) PROCESS - Special Needs []  - Pediatric / Minor Patient Management 0 []  - 0 Isolation Patient Management []  - 0 Hearing / Language / Visual special needs []  - 0 Assessment of Community assistance (transportation, D/C planning, etc.) []  - 0 Additional assistance / Altered mentation []  - 0 Support Surface(s) Assessment (bed, cushion, seat, etc.) INTERVENTIONS - Miscellaneous []  - External ear exam 0 []  - 0 Patient Transfer (multiple staff / Civil Service fast streamer / Similar devices) []  - 0 Simple Staple / Suture removal (25 or less) []  - 0 Complex Staple / Suture removal (26 or more) []  -  0 Hypo/Hyperglycemic Management (do not check if billed separately) []  - 0 Ankle / Brachial Index (ABI) - do not check if billed separately Has the patient been seen at the  hospital within the last three years: Yes Total Score: 0 Level Of Care: ____ [name] ([id number]) Electronic Signature(s) Signed: 04/30/2021 12:20:53 PM By: Carlene Coria RN Entered By: Carlene Coria on 04/27/2021 08:27:11 [name], [name] ([id number]) -------------------------------------------------------------------------------- Encounter Discharge Information Details Patient Name: [name] Date of Service: 04/27/2021 8:00 AM Medical Record Number: [id number] Patient Account Number: [id number] Date of Birth/Sex: 1980/06/13 (40 y.o. M) Treating RN: Carlene Coria Primary Care Farzad Tibbetts: [name] Other Clinician: Referring Remy Voiles: [name] Treating Rosland Riding/Extender: [name] Cliche in Treatment: 229 Encounter Discharge Information Items Post Procedure Vitals Discharge Condition: Stable Temperature (F): 98.3 Ambulatory Status: Ambulatory Pulse (bpm): 84 Discharge Destination: Home Respiratory Rate (breaths/min): 18 Transportation: Private Auto Blood Pressure (mmHg): 138/82 Accompanied By: self Schedule Follow-up Appointment: Yes Clinical Summary of Care: Patient Declined Electronic Signature(s) Signed: 04/30/2021 12:20:53 PM By: Carlene Coria RN Entered By: Carlene Coria on 04/27/2021 08:32:22 [name] ([id number]) -------------------------------------------------------------------------------- Lower Extremity Assessment Details Patient Name: [name] Date of Service: 04/27/2021 8:00 AM Medical Record Number: [id number] Patient Account Number: [id number] Date of Birth/Sex: Oct 27, 1980 (40 y.o. M) Treating RN: Carlene Coria Primary Care Skilar Marcou: [name] Other Clinician: Referring Gyselle Matthew: [name] Treating Samaa Ueda/Extender: [name] Cliche in Treatment:  229 Edema Assessment Assessed: [Left: No] [Right: No] Edema: [Left: Ye] [Right: s] Calf Left: Right: Point of Measurement: 35 cm From Medial Instep 47 cm Ankle Left: Right: Point of Measurement: 10 cm From Medial Instep 28 cm Vascular Assessment Pulses: Dorsalis Pedis Palpable: [Left:Yes] Electronic Signature(s) Signed: 04/30/2021 12:20:53 PM By: Carlene Coria RN Entered By: Carlene Coria on 04/27/2021 08:16:14 [name] ([id number]) -------------------------------------------------------------------------------- Multi Wound Chart Details Patient Name: [name] Date of Service: 04/27/2021 8:00 AM Medical Record Number: [id number] Patient Account Number: [id number] Date of Birth/Sex: [birth year] (40 y.o. M) Treating RN: Carlene Coria Primary Care Bladyn Tipps: [name] Other Clinician: Referring Addisen Chappelle: [name] Treating Stacye Noori/Extender: [name] Cliche in Treatment: 229 Vital Signs Height(in): 71 Pulse(bpm): 21 Weight(lbs): 338 Blood Pressure(mmHg): 138/82 Body Mass Index(BMI): 47 Temperature(F): 98.3 Respiratory Rate(breaths/min): 18 Photos: [N/A:N/A] Wound Location: Left, Lateral Lower Leg N/A N/A Wounding Event: Gradually Appeared N/A N/A Primary Etiology: Pyoderma N/A N/A Comorbid History: Sleep Apnea, Hypertension, Colitis N/A N/A Date Acquired: 11/18/2015 N/A N/A Weeks of Treatment: 229 N/A N/A Wound Status: Open N/A N/A Measurements L x W x D (cm) 4.3x2x0.2 N/A N/A Area (cm) : 6.754 N/A N/A Volume (cm) : 1.351 N/A N/A % Reduction in Area: -37.60% N/A N/A % Reduction in Volume: 65.60% N/A N/A Classification: Full Thickness With Exposed N/A N/A Support Structures Exudate Amount: Medium N/A N/A Exudate Type: Serosanguineous N/A N/A Exudate Color: red, brown N/A N/A Wound Margin: Thickened N/A N/A Granulation Amount: Large (67-100%) N/A N/A Granulation Quality: Red, Hyper-granulation N/A N/A Necrotic Amount: None Present (0%) N/A  N/A Exposed Structures: Fat Layer (Subcutaneous Tissue): N/A N/A Yes Muscle: No Epithelialization: Small (1-33%) N/A N/A Treatment Notes Electronic Signature(s) Signed: 04/30/2021 12:20:53 PM By: Carlene Coria RN Entered By: Carlene Coria on 04/27/2021 08:22:36 [name] ([id number]) -------------------------------------------------------------------------------- [place] Details Patient Name: [name] Date of Service: 04/27/2021 8:00 AM Medical Record Number: [id number] Patient Account Number: [id number] Date of Birth/Sex: 1980-11-11 (40 y.o. M) Treating RN: Carlene Coria Primary Care Zackry Deines: [name] Other Clinician: Referring Jaisa Defino: [name] Treating Kaijah Abts/Extender: [name] Cliche in Treatment: 229 [place] reviewed with physician Active Inactive Necrotic Tissue Nursing Diagnoses: Impaired tissue integrity related to necrotic/devitalized tissue Knowledge deficit related to management of necrotic/devitalized tissue Goals: Necrotic/devitalized  tissue will be minimized in the wound bed Date Initiated: 03/26/2021 Target Resolution Date: 05/26/2021 Goal Status: Active Patient/caregiver will verbalize understanding of reason and process for debridement of necrotic tissue Date Initiated: 03/26/2021 Date Inactivated: 04/27/2021 Target Resolution Date: 03/26/2021 Goal Status: Met Interventions: Assess patient pain level pre-, during and post procedure and prior to discharge Provide education on necrotic tissue and debridement process Treatment Activities: Apply topical anesthetic as ordered : 03/26/2021 Notes: Electronic Signature(s) Signed: 04/30/2021 12:20:53 PM By: Carlene Coria RN Entered By: Carlene Coria on 04/27/2021 08:22:01 [name] ([id number]) -------------------------------------------------------------------------------- Pain Assessment Details Patient Name: [name] Date of Service:  04/27/2021 8:00 AM Medical Record Number: [id number] Patient Account Number: [id number] Date of Birth/Sex: 02-15-1981 (40 y.o. M) Treating RN: Carlene Coria Primary Care Oreoluwa Aigner: [name] Other Clinician: Referring Evette Diclemente: [name] Treating Zael Shuman/Extender: [name] Cliche in Treatment: 229 Active Problems Location of Pain Severity and Description of Pain Patient Has Paino No Site Locations Pain Management and Medication Current Pain Management: Electronic Signature(s) Signed: 04/30/2021 12:20:53 PM By: Carlene Coria RN Entered By: Carlene Coria on 04/27/2021 08:09:07 [name] ([id number]) -------------------------------------------------------------------------------- Patient/Caregiver Education Details Patient Name: [name] Date of Service: 04/27/2021 8:00 AM Medical Record Number: [id number] Patient Account Number: [id number] Date of Birth/Gender: 1980/09/13 (40 y.o. M) Treating RN: Carlene Coria Primary Care Physician: [name] Other Clinician: Referring Physician: [name] Treating Physician/Extender: [name] Cliche in Treatment: 34 Education Assessment Education Provided To: Patient Education Topics Provided Wound Debridement: Methods: Explain/Verbal Responses: State content correctly Electronic Signature(s) Signed: 04/30/2021 12:20:53 PM By: Carlene Coria RN Entered By: Carlene Coria on 04/27/2021 08:27:36 [name] ([id number]) -------------------------------------------------------------------------------- Wound Assessment Details Patient Name: [name] Date of Service: 04/27/2021 8:00 AM Medical Record Number: [id number] Patient Account Number: [id number] Date of Birth/Sex: 19-Feb-1981 (40 y.o. M) Treating RN: Carlene Coria Primary Care Tremar Wickens: [name] Other Clinician: Referring Sueann Brownley: [name] Treating Jazilyn Siegenthaler/Extender: [name] Cliche in Treatment: 229 Wound Status Wound Number:  1 Primary Etiology: Pyoderma Wound Location: Left, Lateral Lower Leg Wound Status: Open Wounding Event: Gradually Appeared Comorbid History: Sleep Apnea, Hypertension, Colitis Date Acquired: 11/18/2015 Weeks Of Treatment: 229 Clustered Wound: No Photos Wound Measurements Length: (cm) 4.3 Width: (cm) 2 Depth: (cm) 0.2 Area: (cm) 6.754 Volume: (cm) 1.351 % Reduction in Area: -37.6% % Reduction in Volume: 65.6% Epithelialization: Small (1-33%) Tunneling: No Undermining: No Wound Description Classification: Full Thickness With Exposed Support Structures Wound Margin: Thickened Exudate Amount: Medium Exudate Type: Serosanguineous Exudate Color: red, brown Foul Odor After Cleansing: No Slough/Fibrino No Wound Bed Granulation Amount: Large (67-100%) Exposed Structure Granulation Quality: Red, Hyper-granulation Fat Layer (Subcutaneous Tissue) Exposed: Yes Necrotic Amount: None Present (0%) Muscle Exposed: No Electronic Signature(s) Signed: 04/30/2021 12:20:53 PM By: Carlene Coria RN Entered By: Carlene Coria on 04/27/2021 08:15:20 [name] ([id number]) -------------------------------------------------------------------------------- Vitals Details Patient Name: [name] Date of Service: 04/27/2021 8:00 AM Medical Record Number: [id number] Patient Account Number: [id number] Date of Birth/Sex: 10/13/1980 (40 y.o. M) Treating RN: Carlene Coria Primary Care Shiro Ellerman: [name] Other Clinician: Referring Maikel Neisler: [name] Treating Justina Bertini/Extender: [name] Cliche in Treatment: 229 Vital Signs Time Taken: 08:08 Temperature (F): 98.3 Height (in): 71 Pulse (bpm): 84 Weight (lbs): 338 Respiratory Rate (breaths/min): 18 Body Mass Index (BMI): 47.1 Blood Pressure (mmHg): 138/82 Reference Range: 80 - 120 mg / dl Electronic Signature(s) Signed: 04/30/2021 12:20:53 PM By: Carlene Coria RN Entered By: Carlene Coria on 04/27/2021 08:08:59

## 2021-04-30 NOTE — Progress Notes (Signed)
RAHEIM, BEUTLER ([id number]) Visit Report for 04/30/2021 Physician Orders Details Patient Name: [name], [name] Date of Service: 04/30/2021 8:15 AM Medical Record Number: [id number] Patient Account Number: [id number] Date of Birth/Sex: 12-22-1980 (40 y.o. M) Treating RN: Levora Dredge Primary Care Provider: [name] Other Clinician: Referring Provider: [name] Treating Provider/Extender: [name] Cliche in Treatment: 229 Verbal / Phone Orders: No Diagnosis Coding Follow-up Appointments o Return Appointment in 1 week. o Nurse Visit as needed - twice a week Bathing/ Shower/ Hygiene o Clean wound with Normal Saline or wound cleanser. Cellular or Tissue Based Products o Cellular or Tissue Based Product Type: - APLIGRAF #2 o Cellular or Tissue Based Product applied to wound bed; including contact layer, fixation with steri-strips, dry gauze and cover dressing. (DO NOT REMOVE). Edema Control - Lymphedema / Segmental Compressive Device / Other o Optional: One layer of unna paste to top of compression wrap (to act as an anchor). o Elevate, Exercise Daily and Avoid Standing for Long Periods of Time. o Elevate legs to the level of the heart and pump ankles as often as possible o Elevate leg(s) parallel to the floor when sitting. Wound Treatment Wound #1 - Lower Leg Wound Laterality: Left, Lateral Cleanser: Wound Cleanser 3 x Per Week/30 Days Discharge Instructions: Wash your hands with soap and water. Remove old dressing, discard into plastic bag and place into trash. Cleanse the wound with Wound Cleanser prior to applying a clean dressing using gauze sponges, not tissues or cotton balls. Do not scrub or use excessive force. Pat dry using gauze sponges, not tissue or cotton balls. Peri-Wound Care: Moisturizing Lotion 3 x Per Week/30 Days Discharge Instructions: Suggestions: Theraderm, Eucerin, Cetaphil, or patient preference. Secondary Dressing: Xtrasorb  Medium 4x5 (in/in) 3 x Per Week/30 Days Discharge Instructions: Apply to wound as directed. Do not cut. Compression Wrap: Medichoice 4 layer Compression System, 35-40 mmHG (Generic) 3 x Per Week/30 Days Discharge Instructions: Apply multi-layer wrap as directed. Electronic Signature(s) Signed: 04/30/2021 9:56:48 AM By: Levora Dredge Signed: 04/30/2021 9:57:25 AM By: Worthy Keeler PA-C Entered By: Levora Dredge on 04/30/2021 09:56:47

## 2021-04-30 NOTE — Progress Notes (Addendum)
JABREE, REBERT ([id number]) Visit Report for 04/30/2021 Arrival Information Details Patient Name: [name], [name] Date of Service: 04/30/2021 8:15 AM Medical Record Number: [id number] Patient Account Number: [id number] Date of Birth/Sex: Dec 05, 1980 (40 y.o. M) Treating RN: Levora Dredge Primary Care Yedidya Duddy: [name] Other Clinician: [name] Referring Lazer Wollard: [name] Treating Burnett Spray/Extender: [name] Cliche in Treatment: 41 Visit Information History Since Last Visit Pain Present Now: No Patient Arrived: Ambulatory Arrival Time: 08:09 Accompanied By: self Transfer Assistance: None Patient Identification Verified: Yes Secondary Verification Process Completed: Yes Patient Requires Transmission-Based No Precautions: Patient Has Alerts: Yes Patient Alerts: Patient has reaction to silver dressings. Electronic Signature(s) Signed: 04/30/2021 11:51:37 AM By: [name], [name], RN, [name], [name], [name] Previous Signature: 04/30/2021 9:53:27 AM Version By: Levora Dredge Entered By: [name] [name], RN, [name], [name] on 04/30/2021 11:51:37 ZYERE, JIMINEZ ([id number]) -------------------------------------------------------------------------------- Compression Therapy Details Patient Name: [name] Date of Service: 04/30/2021 8:15 AM Medical Record Number: [id number] Patient Account Number: [id number] Date of Birth/Sex: 28-Nov-1980 (40 y.o. M) Treating RN: [name] Primary Care Mccabe Gloria: [name] Other Clinician: Referring Asyia Hornung: [name] Treating Haru Anspaugh/Extender: [name] Cliche in Treatment: 229 Compression Therapy Performed for Wound Assessment: Wound #1 Left,Lateral Lower Leg Performed By: Clinician Levora Dredge, RN Compression Type: Four Layer Pre Treatment ABI: 1.2 Electronic Signature(s) Signed: 04/30/2021 11:50:46 AM By: [name], [name], RN, [name], [name], [name] Previous Signature: 04/30/2021 9:56:02 AM Version By: Levora Dredge Entered By: [name]  [name], RN, [name], [name] on 04/30/2021 11:50:45 DYLLEN, MENNING ([id number]) -------------------------------------------------------------------------------- Encounter Discharge Information Details Patient Name: [name] Date of Service: 04/30/2021 8:15 AM Medical Record Number: [id number] Patient Account Number: [id number] Date of Birth/Sex: 02/19/1981 (40 y.o. M) Treating RN: Levora Dredge Primary Care Elisea Khader: [name] Other Clinician: [name] Referring Carsten Carstarphen: [name] Treating Yussuf Sawyers/Extender: [name] Cliche in Treatment: 229 Encounter Discharge Information Items Discharge Condition: Stable Ambulatory Status: Ambulatory Discharge Destination: Home Transportation: Private Auto Accompanied By: self Schedule Follow-up Appointment: Yes Clinical Summary of Care: Electronic Signature(s) Signed: 04/30/2021 11:52:09 AM By: [name], [name], RN, [name], [name], [name] Previous Signature: 04/30/2021 9:57:22 AM Version By: Levora Dredge Entered By: [name] [name], RN, [name], [name] on 04/30/2021 11:52:09 KHUSH, PASION ([id number]) -------------------------------------------------------------------------------- Wound Assessment Details Patient Name: [name] Date of Service: 04/30/2021 8:15 AM Medical Record Number: [id number] Patient Account Number: [id number] Date of Birth/Sex: 1980-06-10 (40 y.o. M) Treating RN: Levora Dredge Primary Care Yeng Frankie: [name] Other Clinician: Referring Dynasia Kercheval: [name] Treating Emmert Roethler/Extender: [name] Cliche in Treatment: 229 Wound Status Wound Number: 1 Primary Etiology: Pyoderma Wound Location: Left, Lateral Lower Leg Wound Status: Open Wounding Event: Gradually Appeared Comorbid History: Sleep Apnea, Hypertension, Colitis Date Acquired: 11/18/2015 Weeks Of Treatment: 229 Clustered Wound: No Wound Measurements Length: (cm) 4.3 Width: (cm) 2 Depth: (cm) 0.2 Area: (cm) 6.754 Volume: (cm) 1.351 % Reduction  in Area: -37.6% % Reduction in Volume: 65.6% Epithelialization: Small (1-33%) Wound Description Classification: Full Thickness With Exposed Support Structures Wound Margin: Thickened Exudate Amount: Medium Exudate Type: Serosanguineous Exudate Color: red, brown Foul Odor After Cleansing: No Slough/Fibrino No Wound Bed Granulation Amount: Large (67-100%) Exposed Structure Granulation Quality: Red, Hyper-granulation Fat Layer (Subcutaneous Tissue) Exposed: Yes Necrotic Amount: None Present (0%) Muscle Exposed: No Assessment Notes mepitel dressing and steri strips still intact, dressing completed as ordered Treatment Notes Wound #1 (Lower Leg) Wound Laterality: Left, Lateral Cleanser Wound Cleanser Discharge Instruction: Wash your hands with soap and water. Remove old dressing, discard into plastic bag and place into trash. Cleanse the wound  with Wound Cleanser prior to applying a clean dressing using gauze sponges, not tissues or cotton balls. Do not scrub or use excessive force. Pat dry using gauze sponges, not tissue or cotton balls. Peri-Wound Care Moisturizing Lotion Discharge Instruction: Suggestions: Theraderm, Eucerin, Cetaphil, or patient preference. Topical Primary Dressing Secondary Dressing Xtrasorb Medium 4x5 (in/in) Discharge Instruction: Apply to wound as directed. Do not cut. Secured With Compression Wrap Medichoice 4 layer Compression System, 35-40 mmHG Bonsall, Vern ([id number]) Discharge Instruction: Apply multi-layer wrap as directed. Compression Stockings Add-Ons Electronic Signature(s) Signed: 04/30/2021 9:54:48 AM By: Levora Dredge Entered By: Levora Dredge on 04/30/2021 09:54:48

## 2021-05-01 ENCOUNTER — Ambulatory Visit (INDEPENDENT_AMBULATORY_CARE_PROVIDER_SITE_OTHER): Payer: 59 | Admitting: Primary Care

## 2021-05-01 ENCOUNTER — Encounter: Payer: Self-pay | Admitting: Primary Care

## 2021-05-01 VITALS — BP 130/76 | HR 92 | Temp 97.4°F | Ht 71.0 in | Wt 346.0 lb

## 2021-05-01 DIAGNOSIS — Z794 Long term (current) use of insulin: Secondary | ICD-10-CM | POA: Diagnosis not present

## 2021-05-01 DIAGNOSIS — E1165 Type 2 diabetes mellitus with hyperglycemia: Secondary | ICD-10-CM | POA: Diagnosis not present

## 2021-05-01 LAB — POCT GLYCOSYLATED HEMOGLOBIN (HGB A1C): Hemoglobin A1C: 7 % — AB (ref 4.0–5.6)

## 2021-05-01 MED ORDER — DEXCOM G6 RECEIVER DEVI: 1.0000 | 0 refills | Status: DC

## 2021-05-01 MED ORDER — DEXCOM G6 TRANSMITTER MISC: 1.0000 | 0 refills | Status: DC

## 2021-05-01 MED ORDER — DEXCOM G6 SENSOR MISC: 1 refills | Status: DC

## 2021-05-01 NOTE — Assessment & Plan Note (Signed)
Improved with A1C of 7.0 today!!  Continue Trulicity 0.75 mg weekly.  Continue Lantus 40 units and stressed the importance of checking glucose levels at least once daily. Will send Dexcom device and sensors as FreeStyle Libre was not covered.  Discussed to resume metformin 1000 mg BID.  Urine microalbumin UTD. Managed on statin.  Follow up in 3 months.

## 2021-05-01 NOTE — Patient Instructions (Signed)
Continue Lantus 40 units daily, Trulicity 0.75 mg weekly for diabetes. Resume metformin 1000 mg twice daily for diabetes.  Start checking your blood sugar levels.  Appropriate times to check your blood sugar levels are:  -Before any meal (breakfast, lunch, dinner) -Two hours after any meal (breakfast, lunch, dinner) -Bedtime  I sent the Dexcom device, transmitter, and sensors to your pharmacy. Change the sensor every 10 days.  We will see you in 3 months.  It was a pleasure to see you today!

## 2021-05-01 NOTE — Progress Notes (Signed)
Subjective:    Patient ID: [name], male    DOB: 10-18-1980, 40 y.o.   MRN: [id number]  HPI  [name] is a very pleasant 40 y.o. male with a history of type 2 diabetes, ulcerative colitis, hypertension, hyperlipidemia, tobacco abuse, chronic skin ulcer who presents today for follow up of diabetes.  He was last evaluated in July 2022 for diabetes, A1C of 11.2, he was instructed to increase Lantus to 35 units for 3-4 days, then increase to 40 units thereafter. He was also instructed to pick up Trulicity 0.75 mg which was sent to pharmacy in February 2022. He was also instructed to return around October 2022 for which he did not.  Current medications include: Lantus 40 units daily, Trulicity 0.75 mg weekly, metformin 1000 mg BID.   He cut his metformin down to 1000 mg once daily as his glucose readings were in the "low 100's".   He is checking his blood glucose 1-2 times weekly and is getting readings of low to mid 100's.   Last A1C: 11.2 in July 2022, 7.0 today.  Last Eye Exam: UTD Last Foot Exam: Due Pneumonia Vaccination: Never completed  Urine Microalbumin: February 2022 Statin: atorvastatin   Dietary changes since last visit: Continues to eat fast food, but has noticed less hunger overall.    Exercise: Active at work.   BP Readings from Last 3 Encounters:  05/01/21 130/76  11/28/20 128/64  09/28/20 119/66    Wt Readings from Last 3 Encounters:  05/01/21 (!) 346 lb (156.9 kg)  11/28/20 (!) 333 lb (151 kg)  09/28/20 (!) 345 lb (156.5 kg)      Review of Systems  Eyes:  Negative for visual disturbance.  Respiratory:  Negative for shortness of breath.   Cardiovascular:  Negative for chest pain.  Neurological:  Negative for dizziness and numbness.        Past Medical History:  Diagnosis Date   Blood in stool    Depression    Elevated blood pressure    Hyperlipidemia    OSA (obstructive sleep apnea) 2014   Seasonal allergies    Ulcerative colitis (HCC)  03/05/2018    Social History   Socioeconomic History   Marital status: Married    Spouse name: Not on file   Number of children: Not on file   Years of education: Not on file   Highest education level: Not on file  Occupational History   Not on file  Tobacco Use   Smoking status: Every Day    Packs/day: 1.00    Years: 14.00    Pack years: 14.00    Types: Cigarettes    Last attempt to quit: 11/25/2018    Years since quitting: 2.4   Smokeless tobacco: Former    Quit date: 03/17/2015  Vaping Use   Vaping Use: Never used  Substance and Sexual Activity   Alcohol use: Not Currently    Alcohol/week: 0.0 standard drinks    Comment: soical   Drug use: Not on file   Sexual activity: Not on file  Other Topics Concern   Not on file  Social History Narrative   Married.   7 kids.   Works as a Magazine features editor at [employer].   Enjoys target shooting.    Social Determinants of Health   Financial Resource Strain: Not on file  Food Insecurity: Not on file  Transportation Needs: Not on file  Physical Activity: Not on file  Stress: Not on file  Social  Connections: Not on file  Intimate Partner Violence: Not on file    Past Surgical History:  Procedure Laterality Date   SEPTOPLASTY  2007   WRIST SURGERY      Family History  Problem Relation Age of Onset   Alcohol abuse Paternal Aunt    Alcohol abuse Paternal Uncle    Stroke Paternal Uncle    Hyperlipidemia Maternal Grandfather    Hypertension Maternal Grandfather    Diabetes Maternal Grandfather    Alcohol abuse Maternal Grandfather    Multiple sclerosis Mother    Dementia Mother     Allergies  Allergen Reactions   Biaxin [Clarithromycin] Other (See Comments)    Causes colitis flares   Milk-Related Compounds Hives    Current Outpatient Medications on File Prior to Visit  Medication Sig Dispense Refill   acetaminophen (TYLENOL) 500 MG tablet Take 1,000 mg by mouth every 6 (six) hours as needed for headache (pain).      atorvastatin (LIPITOR) 40 MG tablet Take 1 tablet (40 mg total) by mouth daily. For cholesterol. 90 tablet 3   blood glucose meter kit and supplies KIT Dispense based on patient and insurance preference. Use up to four times daily as directed. (FOR ICD-9 250.00, 250.01). 1 each 0   buPROPion (WELLBUTRIN SR) 100 MG 12 hr tablet Take 1 tablet (100 mg total) by mouth 2 (two) times daily. For smoking 60 tablet 1   clobetasol ointment (TEMOVATE) 0.07 % Apply 1 application topically 2 (two) times daily.     Continuous Blood Gluc Receiver (FREESTYLE LIBRE 14 DAY READER) DEVI Use with sensor to check blood sugar. 1 each 0   Continuous Blood Gluc Sensor (FREESTYLE LIBRE 14 DAY SENSOR) MISC Use as directed to check blood sugars. 6 each 1   Dulaglutide (TRULICITY) 6.22 QJ/3.3LK SOPN Inject 0.75 mg into the skin once a week. For diabetes. 6 mL 1   ibuprofen (ADVIL,MOTRIN) 200 MG tablet Take 400-600 mg by mouth every 6 (six) hours as needed for headache (pain).     inFLIXimab (REMICADE) 100 MG injection Inject into the muscle once a week.     Insulin Pen Needle (RELION PEN NEEDLES) 31G X 6 MM MISC USE 1  AT NIGHT WITH  INSULIN  AS  DIRECTED. 300 each 0   LANTUS SOLOSTAR 100 UNIT/ML Solostar Pen Inject 40 Units into the skin at bedtime. For diabetes. Office visit required for further refills. 15 mL 0   metFORMIN (GLUCOPHAGE) 1000 MG tablet TAKE 1 TABLET BY MOUTH TWICE DAILY WITH MEALS FOR DIABETES 180 tablet 3   mupirocin ointment (BACTROBAN) 2 % Apply topically two (2) 60g times a day. Mix with clobetasol and apply twice daily     SANTYL ointment Apply topically.     triamcinolone ointment (KENALOG) 0.1 % Apply topically.     varenicline (CHANTIX) 1 MG tablet Take 1/2 tablet daily for three days, then 1/2 tablet twice daily for five days, then 1 tablet twice daily thereafter for smoking cessation. 60 tablet 0   venlafaxine XR (EFFEXOR-XR) 150 MG 24 hr capsule Take 1 capsule (150 mg total) by mouth daily with  breakfast. For depression. 90 capsule 3   hydrocortisone 2.5 % ointment Apply topically.     ketoconazole (NIZORAL) 2 % cream Apply topically 2 (two) times daily.     No current facility-administered medications on file prior to visit.    BP 130/76    Pulse 92    Temp (!) 97.4 F (36.3 C) (Temporal)  Ht '5\' 11"'$  (1.803 m)    Wt (!) 346 lb (156.9 kg)    SpO2 96%    BMI 48.26 kg/m  Objective:   Physical Exam Cardiovascular:     Rate and Rhythm: Normal rate and regular rhythm.  Pulmonary:     Effort: Pulmonary effort is normal.     Breath sounds: Normal breath sounds. No wheezing or rales.  Musculoskeletal:     Cervical back: Neck supple.  Skin:    General: Skin is warm and dry.  Neurological:     Mental Status: He is alert and oriented to person, place, and time.          Assessment & Plan:      This visit occurred during the SARS-CoV-2 public health emergency.  Safety protocols were in place, including screening questions prior to the visit, additional usage of staff PPE, and extensive cleaning of exam room while observing appropriate contact time as indicated for disinfecting solutions.

## 2021-05-02 ENCOUNTER — Telehealth: Payer: Self-pay

## 2021-05-02 ENCOUNTER — Other Ambulatory Visit: Payer: Self-pay

## 2021-05-02 DIAGNOSIS — E11622 Type 2 diabetes mellitus with other skin ulcer: Secondary | ICD-10-CM | POA: Diagnosis not present

## 2021-05-02 NOTE — Progress Notes (Signed)
OTT, ZIMMERLE ([id number]) Visit Report for 05/02/2021 Arrival Information Details Patient Name: [name], [name] Date of Service: 05/02/2021 8:00 AM Medical Record Number: [id number] Patient Account Number: [id number] Date of Birth/Sex: 06-15-1980 (40 y.o. M) Treating RN: Donnamarie Poag Primary Care Wyllow Seigler: [name] Other Clinician: Referring Cailin Gebel: [name] Treating Cyril Woodmansee/Extender: Yaakov Guthrie in Treatment: 35 Visit Information History Since Last Visit Added or deleted any medications: No Patient Arrived: Ambulatory Had a fall or experienced change in No Arrival Time: 08:03 activities of daily living that may affect Accompanied By: self risk of falls: Transfer Assistance: None Hospitalized since last visit: No Patient Identification Verified: Yes Has Dressing in Place as Prescribed: Yes Secondary Verification Process Completed: Yes Has Compression in Place as Prescribed: Yes Patient Requires Transmission-Based No Pain Present Now: No Precautions: Patient Has Alerts: Yes Patient Alerts: Patient has reaction to silver dressings. Electronic Signature(s) Signed: 05/02/2021 2:22:35 PM By: Donnamarie Poag Entered By: Donnamarie Poag on 05/02/2021 08:24:02 [name] ([id number]) -------------------------------------------------------------------------------- Compression Therapy Details Patient Name: [name] Date of Service: 05/02/2021 8:00 AM Medical Record Number: [id number] Patient Account Number: [id number] Date of Birth/Sex: Feb 17, 1981 (40 y.o. M) Treating RN: Donnamarie Poag Primary Care Carrieann Spielberg: [name] Other Clinician: Referring Tylin Force: [name] Treating Farren Nelles/Extender: Yaakov Guthrie in Treatment: 230 Compression Therapy Performed for Wound Assessment: Wound #1 Left,Lateral Lower Leg Performed By: Junius Argyle, RN Compression Type: Four Layer Electronic Signature(s) Signed: 05/02/2021 2:22:35 PM By:  Donnamarie Poag Entered By: Donnamarie Poag on 05/02/2021 08:24:50 [name] ([id number]) -------------------------------------------------------------------------------- Encounter Discharge Information Details Patient Name: [name] Date of Service: 05/02/2021 8:00 AM Medical Record Number: [id number] Patient Account Number: [id number] Date of Birth/Sex: [birth year] (40 y.o. M) Treating RN: Donnamarie Poag Primary Care Josalyn Dettmann: [name] Other Clinician: Referring Edna Rede: [name] Treating Angelyna Henderson/Extender: Yaakov Guthrie in Treatment: 230 Encounter Discharge Information Items Discharge Condition: Stable Ambulatory Status: Ambulatory Discharge Destination: Home Transportation: Private Auto Accompanied By: self Schedule Follow-up Appointment: Yes Clinical Summary of Care: Electronic Signature(s) Signed: 05/02/2021 2:22:35 PM By: Donnamarie Poag Entered By: Donnamarie Poag on 05/02/2021 08:26:03 [name] ([id number]) -------------------------------------------------------------------------------- Wound Assessment Details Patient Name: [name] Date of Service: 05/02/2021 8:00 AM Medical Record Number: [id number] Patient Account Number: [id number] Date of Birth/Sex: Oct 13, 1980 (40 y.o. M) Treating RN: Donnamarie Poag Primary Care Lindsay Straka: [name] Other Clinician: Referring Gladyes Kudo: [name] Treating Deavion Dobbs/Extender: Yaakov Guthrie in Treatment: 230 Wound Status Wound Number: 1 Primary Etiology: Pyoderma Wound Location: Left, Lateral Lower Leg Wound Status: Open Wounding Event: Gradually Appeared Comorbid History: Sleep Apnea, Hypertension, Colitis Date Acquired: 11/18/2015 Weeks Of Treatment: 230 Clustered Wound: No Wound Measurements Length: (cm) 4.3 Width: (cm) 2 Depth: (cm) 0.2 Area: (cm) 6.754 Volume: (cm) 1.351 % Reduction in Area: -37.6% % Reduction in Volume: 65.6% Epithelialization: Small (1-33%) Wound  Description Classification: Full Thickness With Exposed Support Structures Wound Margin: Thickened Exudate Amount: Medium Exudate Type: Serosanguineous Exudate Color: red, brown Foul Odor After Cleansing: No Slough/Fibrino No Wound Bed Granulation Amount: Large (67-100%) Exposed Structure Granulation Quality: Red, Hyper-granulation Fat Layer (Subcutaneous Tissue) Exposed: Yes Necrotic Amount: None Present (0%) Muscle Exposed: No Treatment Notes Wound #1 (Lower Leg) Wound Laterality: Left, Lateral Cleanser Wound Cleanser Discharge Instruction: Wash your hands with soap and water. Remove old dressing, discard into plastic bag and place into trash. Cleanse the wound with Wound Cleanser prior to applying a clean dressing using gauze sponges, not tissues or cotton balls. Do not scrub or use excessive force. Pat dry using gauze sponges,  not tissue or cotton balls. Peri-Wound Care Moisturizing Lotion Discharge Instruction: Suggestions: Theraderm, Eucerin, Cetaphil, or patient preference. Topical Primary Dressing Secondary Dressing Xtrasorb Medium 4x5 (in/in) Discharge Instruction: Apply to wound as directed. Do not cut. Secured With Compression Wrap Medichoice 4 layer Compression System, 35-40 mmHG Discharge Instruction: Apply multi-layer wrap as directed. LENWOOD, BALSAM ([id number]) Compression Stockings Add-Ons Electronic Signature(s) Signed: 05/02/2021 2:22:35 PM By: Donnamarie Poag Entered ByDonnamarie Poag on 05/02/2021 08:24:33

## 2021-05-02 NOTE — Progress Notes (Signed)
JOVANNY, STEPHANIE ([id number]) Visit Report for 05/02/2021 Physician Orders Details Patient Name: [name], [name] Date of Service: 05/02/2021 8:00 AM Medical Record Number: [id number] Patient Account Number: [id number] Date of Birth/Sex: 02/21/1981 (40 y.o. M) Treating RN: Hansel Feinstein Primary Care Provider: [name] Other Clinician: Referring Provider: [name] Treating Provider/Extender: Tilda Franco in Treatment: [id number] Verbal / Phone Orders: No Diagnosis Coding Follow-up Appointments o Return Appointment in 1 week. o Nurse Visit as needed - twice a week Bathing/ Shower/ Hygiene o Clean wound with Normal Saline or wound cleanser. o May shower with wound dressing protected with water repellent cover or cast protector. Cellular or Tissue Based Products o Cellular or Tissue Based Product Type: - APLIGRAF #2 o Cellular or Tissue Based Product applied to wound bed; including contact layer, fixation with steri-strips, dry gauze and cover dressing. (DO NOT REMOVE). Edema Control - Lymphedema / Segmental Compressive Device / Other o Optional: One layer of unna paste to top of compression wrap (to act as an anchor). o Elevate, Exercise Daily and Avoid Standing for Long Periods of Time. o Elevate legs to the level of the heart and pump ankles as often as possible o Elevate leg(s) parallel to the floor when sitting. Wound Treatment Wound #1 - Lower Leg Wound Laterality: Left, Lateral Cleanser: Wound Cleanser 3 x Per Week/30 Days Discharge Instructions: Wash your hands with soap and water. Remove old dressing, discard into plastic bag and place into trash. Cleanse the wound with Wound Cleanser prior to applying a clean dressing using gauze sponges, not tissues or cotton balls. Do not scrub or use excessive force. Pat dry using gauze sponges, not tissue or cotton balls. Peri-Wound Care: Moisturizing Lotion 3 x Per Week/30 Days Discharge Instructions:  Suggestions: Theraderm, Eucerin, Cetaphil, or patient preference. Secondary Dressing: Xtrasorb Medium 4x5 (in/in) 3 x Per Week/30 Days Discharge Instructions: Apply to wound as directed. Do not cut. Compression Wrap: Medichoice 4 layer Compression System, 35-40 mmHG (Generic) 3 x Per Week/30 Days Discharge Instructions: Apply multi-layer wrap as directed. Electronic Signature(s) Signed: 05/02/2021 8:40:40 AM By: Geralyn Corwin DO Signed: 05/02/2021 2:22:35 PM By: Hansel Feinstein Entered By: Hansel Feinstein on 05/02/2021 08:25:33 [name] ([id number]) -------------------------------------------------------------------------------- SuperBill Details Patient Name: [name] Date of Service: 05/02/2021 Medical Record Number: [id number] Patient Account Number: [id number] Date of Birth/Sex: 16-Sep-1980 (40 y.o. 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

## 2021-05-02 NOTE — Telephone Encounter (Signed)
Dexcom Sensor     Receiver:

## 2021-05-04 ENCOUNTER — Other Ambulatory Visit: Payer: Self-pay

## 2021-05-04 ENCOUNTER — Encounter: Payer: 59 | Admitting: Physician Assistant

## 2021-05-04 DIAGNOSIS — E11622 Type 2 diabetes mellitus with other skin ulcer: Secondary | ICD-10-CM | POA: Diagnosis not present

## 2021-05-04 NOTE — Progress Notes (Addendum)
QURAN, VASCO ([id number]) Visit Report for 05/04/2021 Arrival Information Details Patient Name: [name], [name] Date of Service: 05/04/2021 8:00 AM Medical Record Number: [id number] Patient Account Number: [id number] Date of Birth/Sex: [birth year] (40 y.o. M) Treating RN: [name] Primary Care Baylynn Shifflett: [name] Other Clinician: Referring Daveion Robar: [name] Treating Daijanae Rafalski/Extender: [name] Cliche in Treatment: 54 Visit Information History Since Last Visit Added or deleted any medications: No Patient Arrived: Ambulatory Has Dressing in Place as Prescribed: Yes Arrival Time: 08:10 Has Compression in Place as Prescribed: Yes Accompanied By: self Pain Present Now: No Transfer Assistance: None Patient Identification Verified: Yes Secondary Verification Process Completed: Yes Patient Requires Transmission-Based No Precautions: Patient Has Alerts: Yes Patient Alerts: Patient has reaction to silver dressings. Electronic Signature(s) Signed: 05/07/2021 1:14:10 PM By: [name], [name], RN, [name], [name], [name] Entered By: [name], [name], RN, [name], [name] on 05/04/2021 08:11:02 MARL, SEAGO ([id number]) -------------------------------------------------------------------------------- Encounter Discharge Information Details Patient Name: [name] Date of Service: 05/04/2021 8:00 AM Medical Record Number: [id number] Patient Account Number: [id number] Date of Birth/Sex: 1981/01/15 (40 y.o. M) Treating RN: [name] Primary Care Maven Varelas: [name] Other Clinician: Referring Kristianne Albin: [name] Treating Sherie Dobrowolski/Extender: [name] Cliche in Treatment: 230 Encounter Discharge Information Items Post Procedure Vitals Discharge Condition: Stable Temperature (F): 98.4 Ambulatory Status: Ambulatory Pulse (bpm): 82 Discharge Destination: Home Respiratory Rate (breaths/min): 16 Transportation: Private Auto Blood Pressure (mmHg): 142/87 Accompanied By: self Schedule  Follow-up Appointment: No Clinical Summary of Care: Electronic Signature(s) Signed: 05/04/2021 9:02:39 AM By: [name], [name], RN, [name], [name], [name] Entered By: [name], [name], RN, [name], [name] on 05/04/2021 09:02:39 [name] ([id number]) -------------------------------------------------------------------------------- Lower Extremity Assessment Details Patient Name: [name] Date of Service: 05/04/2021 8:00 AM Medical Record Number: [id number] Patient Account Number: [id number] Date of Birth/Sex: 05/04/1981 (40 y.o. M) Treating RN: [name] Primary Care Sayid Moll: [name] Other Clinician: Referring Sheikh Leverich: [name] Treating Nolyn Eilert/Extender: [name] Cliche in Treatment: 230 Edema Assessment Assessed: [Left: No] [Right: No] [Left: Edema] [Right: :] Calf Left: Right: Point of Measurement: 35 cm From Medial Instep 48 cm Ankle Left: Right: Point of Measurement: 10 cm From Medial Instep 29.5 cm Vascular Assessment Pulses: Dorsalis Pedis Palpable: [Left:Yes] Electronic Signature(s) Signed: 05/07/2021 1:14:10 PM By: [name], [name], RN, [name], [name], [name] Entered By: [name], [name], RN, [name], [name] on 05/04/2021 08:19:54 [name] ([id number]) -------------------------------------------------------------------------------- Multi Wound Chart Details Patient Name: [name] Date of Service: 05/04/2021 8:00 AM Medical Record Number: [id number] Patient Account Number: [id number] Date of Birth/Sex: 1980-08-05 (40 y.o. M) Treating RN: [name] Primary Care Kailan Carmen: [name] Other Clinician: Referring Timmothy Baranowski: [name] Treating Tenisha Fleece/Extender: [name] Cliche in Treatment: 230 Vital Signs Height(in): 71 Pulse(bpm): 26 Weight(lbs): 338 Blood Pressure(mmHg): 142/87 Body Mass Index(BMI): 47 Temperature(F): 98.4 Respiratory Rate(breaths/min): 18 Photos: [N/A:N/A] Wound Location: Left, Lateral Lower Leg Left, Lateral Lower Leg N/A Wounding Event:  Gradually Appeared Gradually Appeared N/A Primary Etiology: Pyoderma Pyoderma N/A Comorbid History: Sleep Apnea, Hypertension, Colitis Sleep Apnea, Hypertension, Colitis N/A Date Acquired: 11/18/2015 11/18/2015 N/A Weeks of Treatment: 230 230 N/A Wound Status: Open Open N/A Measurements L x W x D (cm) 2.8x2.4x0.3 2.8x2.4x0.3 N/A Area (cm) : 5.278 5.278 N/A Volume (cm) : 1.583 1.583 N/A % Reduction in Area: -7.50% -7.50% N/A % Reduction in Volume: 59.70% 59.70% N/A Classification: Full Thickness With Exposed Full Thickness With Exposed N/A Support Structures Support Structures Exudate Amount: Medium Medium N/A Exudate Type: Serosanguineous Serosanguineous N/A Exudate Color: red, brown red, brown N/A Wound Margin: Flat and Intact  Flat and Intact N/A Granulation Amount: Large (67-100%) Large (67-100%) N/A Granulation Quality: Red Red N/A Necrotic Amount: None Present (0%) Small (1-33%) N/A Exposed Structures: Fat Layer (Subcutaneous Tissue): Fat Layer (Subcutaneous Tissue): N/A Yes Yes Fascia: No Fascia: No Tendon: No Tendon: No Muscle: No Muscle: No Joint: No Joint: No Bone: No Bone: No Epithelialization: Small (1-33%) Small (1-33%) N/A Treatment Notes Electronic Signature(s) Signed: 05/07/2021 1:14:10 PM By: [name], [name], RN, [name], [name], [name] Entered By: [name], [name], RN, [name], [name] on 05/04/2021 08:38:05 [name] ([id number]) -------------------------------------------------------------------------------- Multi-Disciplinary Care Plan Details Patient Name: [name] Date of Service: 05/04/2021 8:00 AM Medical Record Number: [id number] Patient Account Number: [id number] Date of Birth/Sex: [birth year] (40 y.o. M) Treating RN: [name] Primary Care Alucard Fearnow: [name] Other Clinician: Referring Xylia Scherger: [name] Treating Pam Vanalstine/Extender: [name] Cliche in Treatment: 41 [place] reviewed with physician Active Inactive Necrotic  Tissue Nursing Diagnoses: Impaired tissue integrity related to necrotic/devitalized tissue Knowledge deficit related to management of necrotic/devitalized tissue Goals: Necrotic/devitalized tissue will be minimized in the wound bed Date Initiated: 03/26/2021 Target Resolution Date: 05/26/2021 Goal Status: Active Patient/caregiver will verbalize understanding of reason and process for debridement of necrotic tissue Date Initiated: 03/26/2021 Date Inactivated: 04/27/2021 Target Resolution Date: 03/26/2021 Goal Status: Met Interventions: Assess patient pain level pre-, during and post procedure and prior to discharge Provide education on necrotic tissue and debridement process Treatment Activities: Apply topical anesthetic as ordered : 03/26/2021 Notes: Electronic Signature(s) Signed: 05/07/2021 1:14:10 PM By: [name], [name], RN, [name], [name], [name] Entered By: [name], [name], RN, [name], [name] on 05/04/2021 08:32:48 [name] ([id number]) -------------------------------------------------------------------------------- Pain Assessment Details Patient Name: [name] Date of Service: 05/04/2021 8:00 AM Medical Record Number: [id number] Patient Account Number: [id number] Date of Birth/Sex: [birth year] (40 y.o. M) Treating RN: [name] Primary Care Savvy Peeters: [name] Other Clinician: Referring Timberlynn Kizziah: [name] Treating Brindley Madarang/Extender: [name] Cliche in Treatment: 230 Active Problems Location of Pain Severity and Description of Pain Patient Has Paino Yes Site Locations Rate the pain. Current Pain Level: 1 Pain Management and Medication Current Pain Management: Notes Patient states more irritation than pain. Electronic Signature(s) Signed: 05/07/2021 1:14:10 PM By: [name], [name], RN, [name], [name], [name] Entered By: [name], [name], RN, [name], [name] on 05/04/2021 08:11:57 SUTTER, AHLGREN  ([id number]) -------------------------------------------------------------------------------- Patient/Caregiver Education Details Patient Name: [name] Date of Service: 05/04/2021 8:00 AM Medical Record Number: [id number] Patient Account Number: [id number] Date of Birth/Gender: 23-Aug-1980 (40 y.o. M) Treating RN: [name] Primary Care Physician: [name] Other Clinician: Referring Physician: [name] Treating Physician/Extender: [name] Cliche in Treatment: 59 Education Assessment Education Provided To: Patient Education Topics Provided Tissue Oxygenation: Venous: Handouts: Controlling Swelling with Multilayered Compression Wraps Methods: Demonstration, Explain/Verbal Responses: State content correctly Wound/Skin Impairment: Handouts: Caring for Your Ulcer Methods: Demonstration, Explain/Verbal Responses: State content correctly Electronic Signature(s) Signed: 05/07/2021 1:14:10 PM By: [name], [name], RN, [name], [name], [name] Entered By: [name], [name], RN, [name], [name] on 05/04/2021 09:01:35 [name] ([id number]) -------------------------------------------------------------------------------- Wound Assessment Details Patient Name: [name] Date of Service: 05/04/2021 8:00 AM Medical Record Number: [id number] Patient Account Number: [id number] Date of Birth/Sex: 1980-12-07 (40 y.o. M) Treating RN: [name] Primary Care Dennison Mcdaid: [name] Other Clinician: Referring Lacrecia Delval: [name] Treating Jian Hodgman/Extender: [name] Cliche in Treatment: 230 Wound Status Wound Number: 1 Primary Etiology: Pyoderma Wound Location: Left, Lateral Lower Leg Wound Status: Open Wounding Event: Gradually Appeared Comorbid History: Sleep Apnea, Hypertension, Colitis Date Acquired: 11/18/2015 Weeks Of Treatment:  230 Clustered Wound: No Photos Photo Uploaded By: [name], [name], RN, [name], [name] on 05/04/2021 08:24:46 Wound Measurements Length: (cm) 2.8 Width: (cm)  2.4 Depth: (cm) 0.3 Area: (cm) 5.278 Volume: (cm) 1.583 % Reduction in Area: -7.5% % Reduction in Volume: 59.7% Epithelialization: Small (1-33%) Tunneling: No Undermining: No Wound Description Classification: Full Thickness With Exposed Support Structure Wound Margin: Flat and Intact Exudate Amount: Medium Exudate Type: Serosanguineous Exudate Color: red, brown s Wound Bed Granulation Amount: Large (67-100%) Exposed Structure Granulation Quality: Red Fascia Exposed: No Necrotic Amount: None Present (0%) Fat Layer (Subcutaneous Tissue) Exposed: Yes Tendon Exposed: No Muscle Exposed: No Joint Exposed: No Bone Exposed: No Electronic Signature(s) Signed: 05/07/2021 1:14:10 PM By: [name], [name], RN, [name], [name], [name] Entered By: [name], [name], RN, [name], [name] on 05/04/2021 08:19:18 [name] ([id number]) -------------------------------------------------------------------------------- Wound Assessment Details Patient Name: [name] Date of Service: 05/04/2021 8:00 AM Medical Record Number: [id number] Patient Account Number: [id number] Date of Birth/Sex: 1981/02/25 (40 y.o. M) Treating RN: [name] Primary Care Avishai Reihl: [name] Other Clinician: Referring Maxtyn Nuzum: [name] Treating Rubel Heckard/Extender: [name] Cliche in Treatment: 230 Wound Status Wound Number: 1 Primary Etiology: Pyoderma Wound Location: Left, Lateral Lower Leg Wound Status: Open Wounding Event: Gradually Appeared Comorbid History: Sleep Apnea, Hypertension, Colitis Date Acquired: 11/18/2015 Weeks Of Treatment: 230 Clustered Wound: No Photos Photo Uploaded By: [name], [name], RN, [name], [name] on 05/04/2021 08:24:53 Wound Measurements Length: (cm) 2.8 Width: (cm) 2.4 Depth: (cm) 0.3 Area: (cm) 5.278 Volume: (cm) 1.583 % Reduction in Area: -7.5% % Reduction in Volume: 59.7% Epithelialization: Small (1-33%) Tunneling: No Undermining: No Wound Description Classification: Full Thickness  With Exposed Support Structures Wound Margin: Flat and Intact Exudate Amount: Medium Exudate Type: Serosanguineous Exudate Color: red, brown Foul Odor After Cleansing: No Slough/Fibrino Yes Wound Bed Granulation Amount: Large (67-100%) Exposed Structure Granulation Quality: Red Fascia Exposed: No Necrotic Amount: Small (1-33%) Fat Layer (Subcutaneous Tissue) Exposed: Yes Necrotic Quality: Adherent Slough Tendon Exposed: No Muscle Exposed: No Joint Exposed: No Bone Exposed: No Electronic Signature(s) Signed: 05/07/2021 1:14:10 PM By: [name], [name], RN, [name], [name], [name] Entered By: [name], [name], RN, [name], [name] on 05/04/2021 08:20:47 [name] ([id number]) -------------------------------------------------------------------------------- [place] Details Patient Name: [name] Date of Service: 05/04/2021 8:00 AM Medical Record Number: [id number] Patient Account Number: [id number] Date of Birth/Sex: 07/14/1980 (40 y.o. M) Treating RN: [name] Primary Care Tanique Matney: [name] Other Clinician: Referring Tyshay Adee: [name] Treating Aleigh Grunden/Extender: [name] Cliche in Treatment: 230 Vital Signs Time Taken: 08:11 Temperature (F): 98.4 Height (in): 71 Pulse (bpm): 82 Weight (lbs): 338 Respiratory Rate (breaths/min): 18 Body Mass Index (BMI): 47.1 Blood Pressure (mmHg): 142/87 Reference Range: 80 - 120 mg / dl Electronic Signature(s) Signed: 05/07/2021 1:14:10 PM By: [name], [name], RN, [name], [name], [name] Entered By: [name], [name], RN, [name], [name] on 05/04/2021 08:11:31

## 2021-05-04 NOTE — Telephone Encounter (Signed)
Fax received with Authorization  [id number] Effective 05/02/21 to 05/02/2022 My chart sent to patient to let him know.

## 2021-05-04 NOTE — Progress Notes (Addendum)
[name], [name] ([id number]) Visit Report for 05/04/2021 Chief Complaint Document Details Patient Name: [name], [name] Date of Service: 05/04/2021 8:00 AM Medical Record Number: [id number] Patient Account Number: [id number] Date of Birth/Sex: March 05, 1981 (40 y.o. 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 M) Treating RN: [name] Primary Care Provider: [name] Other Clinician: Referring Provider: [name] Treating Provider/Extender: [name] Cliche in Treatment: 49 Cellular or Tissue Based Product Type Wound #1 Left,Lateral Lower Leg Applied to: Performed By: Physician Tommie Sams., PA-C Cellular or Tissue Based Product Apligraf Type: Level of Consciousness (Pre- Awake and Alert procedure): Pre-procedure Verification/Time Out Yes - 08:38 Taken: Location: trunk / arms / legs Wound Size (sq cm): 6.72 Product Size (sq cm): 44 Waste Size (sq cm): 34 Waste Reason: product size vs wound size Amount of Product Applied (sq cm): 10 Instrument Used: Forceps Lot #: [id number] Expiration Date: 05/15/2021 Fenestrated: Yes Instrument: Blade Reconstituted: No Secured: Yes Secured With: Steri-Strips Dressing Applied: Yes Primary Dressing: mepitel one Level of  Consciousness (Post- Awake and Alert procedure): Post Procedure Diagnosis Same as Therapist, sports) Signed: 05/07/2021 1:14:10 PM By: [name], [name], RN, [name], [name], [name] Entered By: [name], [name], RN, [name], [name] on 05/04/2021 08:40:25 [name] ([id number]) -------------------------------------------------------------------------------- HPI Details Patient Name: [name] Date of Service: 05/04/2021 8:00 AM Medical Record Number: [id number] Patient Account Number: [id number] Date of Birth/Sex: 1981-04-06 (40 y.o. M) Treating RN: Donnamarie Poag Primary Care Provider: [name] Other Clinician: Referring Provider: [name] Treating Provider/Extender: [name] Cliche in Treatment: 74 History of Present Illness HPI Description: 12/04/16; 40 year old man who comes into the clinic today for review of a wound on the posterior left calf. He tells me that is been there for about a year. He is not a diabetic he does smoke half a pack per day. He was seen in the ER on 11/20/16 felt to have cellulitis around the wound and was given clindamycin. An x-ray did not show osteomyelitis. The patient initially tells me that he has a milk allergy that sets off a pruritic itching rash on his lower legs which she scratches incessantly and he thinks that's what may have set up the wound. He has been using various topical antibiotics and ointments without any effect. He works in a [employer] and is on his feet all day. He does not have a prior history of wounds however he does have the rash on both lower legs the right arm and the ventral aspect of his left arm. These are excoriations and clearly have had scratching however there are of macular looking areas on both legs including a substantial larger area on the right leg. This does not have an underlying open area. There is no blistering. The patient tells me that 2 years ago in [place] in response to the rash on his legs he saw a  dermatologist who told him he had a condition which may be pyoderma gangrenosum although I may be putting words into his mouth. He  seemed to recognize this. On further questioning he admits to a 5 year history of quiesced. ulcerative colitis. He is not in any treatment for this. He's had no recent travel 12/11/16; the patient arrives today with his wound and roughly the same condition we've been using silver alginate this is a deep punched out wound with some surrounding erythema but no tenderness. Biopsy I did did not show confirmed pyoderma gangrenosum suggested nonspecific inflammation and vasculitis but does not provide an actual description of what was seen by the pathologist. I'm really not able to understand this We have also received information from the patient's dermatologist in [place] notes from April 2016. This was a doctor Agarwal-antal. 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 He was prescribed topical steroid high potency under occlusion which helped but at this point the patient did not have a deep punched out wound. 12/18/16; the patient's wound is larger in terms of surface area however this surface looks better and there is less depth. The surrounding erythema also is better. The patient states that the wrap we put on came off 2 days ago when he has been using his compression stockings. He we are in the process of getting a dermatology consult. 12/26/16 on evaluation today patient's left lower extremity wound shows evidence of infection with surrounding erythema noted. He has been tolerating the dressing changes but states that he has noted more discomfort. There is a larger area of erythema surrounding the wound. No fevers, chills, nausea, or vomiting noted at this time. With that being said the wound still does have slough covering the surface. He is not allergic to any medication that he is aware of at this point. In regard to his right lower extremity he had several  regions that are erythematous and pruritic he wonders if there's anything we can do to help that. 01/02/17 I reviewed patient's wound culture which was obtained his visit last week. He was placed on doxycycline at that point. Unfortunately that does not appear to be an antibiotic that would likely help with the situation however the pseudomonas noted on culture is sensitive to Cipro. Also unfortunately patient's wound seems to have a large compared to last week's evaluation. Not severely so but there are definitely increased measurements in general. He is continuing to have discomfort as well he writes this to be a seven out of 10. In fact he would prefer me not to perform any debridement today due to the fact that he is having discomfort and considering he has an active infection on the little reluctant to do so anyway. No fevers, chills, nausea, or vomiting noted at this time. 01/08/17; patient seems dermatology on September 5. I suspect dermatology will want the slides from the biopsy I did sent to their pathologist. I'm not sure if there is a way we can expedite that. In any case the culture I did before I left on vacation 3 weeks ago showed Pseudomonas he was given 10 days of Cipro and per her description of her intake nurses is actually somewhat better this week although the wound is quite a bit bigger than I remember the last time I saw this. He still has 3 more days of Cipro 01/21/17; dermatology appointment tomorrow. He has completed the ciprofloxacin for Pseudomonas. Surface of the wound looks better however he is had some deterioration in the lesions on his right leg. Meantime the left lateral leg wound we will continue with sample 01/29/17; patient had his dermatology  appointment but I can't yet see that note. He is completed his antibiotics. The wound is more superficial but considerably larger in circumferential area than when he came in. This is in his left lateral calf. He also has swollen  erythematous areas with superficial wounds on the right leg and small papular areas on both arms. There apparently areas in her his upper thighs and buttocks I did not look at those. Dermatology biopsied the right leg. Hopefully will have their input next week. 02/05/17; patient went back to see his dermatologist who told him that he had a "scratching problem" as well as staph. He is now on a 30 day course of doxycycline and I believe she gave him triamcinolone cream to the right leg areas to help with the itching [not exactly sure but probably triamcinolone]. She apparently looked at the left lateral leg wound although this was not rebiopsied and I think felt to be ultimately part of the same pathogenesis. He is using sample border foam and changing nevus himself. He now has a new open area on the right posterior leg which was his biopsy site I don't have any of the dermatology notes 02/12/17; we put the patient in compression last week with SANTYL to the wound on the left leg and the biopsy. Edema is much better and the depth of the wound is now at level of skin. Area is still the same oBiopsy site on the right lateral leg we've also been using santyl with a border foam dressing and he is changing this himself. 02/19/17; Using silver alginate started last week to both the substantial left leg wound and the biopsy site on the right wound. He is tolerating compression well. Has a an appointment with his primary M.D. tomorrow wondering about diuretics although I'm wondering if the edema problem is actually lymphedema 02/26/17; the patient has been to see his primary doctor Dr. Jerrel Ivory at [place] our primary care. She started him on Lasix 20 mg and this seems to have helped with the edema. However we are not making substantial change with the left lateral calf wound and inflammation. The biopsy site on the right leg also looks stable but not really all that different. 03/12/17; the patient has  been to see vein and vascular Dr. Lucky Cowboy. He has had venous reflux studies I have not reviewed these. I did get a call from his dermatology office. They felt that he might have pathergy based on their biopsy on his right leg which led them to look at the slides of HORST, OSTERMILLER ([id number]) the biopsy I did on the left leg and they wonder whether this represents pyoderma gangrenosum which was the original supposition in a man with ulcerative colitis albeit inactive for many years. They therefore recommended clobetasol and tetracycline i.e. aggressive treatment for possible pyoderma gangrenosum. 03/26/17; apparently the patient just had reflux studies not an appointment with Dr. dew. She arrives in clinic today having applied clobetasol for 2-3 weeks. He notes over the last 2-3 days excessive drainage having to change the dressing 3-4 times a day and also expanding erythema. 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 Lillia Carmel at [name] dermatology locally who kindly saw him at my request. 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 My concern was the degree of erythema medially extending from his left leg wound which was either inflammation from pyoderma or cellulitis. I put him on Augmentin however culture of the wound showed Pseudomonas which is quinolone sensitive. I really don't believe he has cellulitis however in view of everything I will continue and give him a course of Cipro. He is also on doxycycline as an immune modulator for the pyoderma. In addition to his original wound on the left lateral leg with surrounding erythema he has a wound on the right posterior calf which was an  original biopsy site done by dermatology. 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 Saw Dr. Lillia Carmel yesterday. He has been using topical antibiotics to both wound areas his original wound on the left and the biopsies/pathergy area on the right. There is definitely some improvement in the inflammation around the wound on the right although the patient states he has increasing sensitivity of the wounds. He is on prednisone 60 and doxycycline 1 as prescribed by Dr. Lillia Carmel. He is covering the topical antibiotic with gauze and putting this in his own compression stocks and changing this daily. He states that Dr. Lottie Rater did a culture of the left leg wound yesterday 05/07/17; pyoderma gangrenosum. The patient saw Dr. Lillia Carmel yesterday and has a follow-up with her in one month. He is still using topical antibiotics to both wounds although he can't recall exactly what type. He is still on prednisone 60 mg. Dr. Lillia Carmel stated that the doxycycline could stop if we were in agreement. 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 The right medial leg was induced by biopsy/pathergy. The area on the right is essentially healed. 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 The right medial leg has closed and remains closed. He is still on prednisone 60. oHe tells me he missed his last dermatology appointment with Dr. Lillia Carmel but will make another appointment. He reports that her blood sugar at a recent screen in [place] was high 200's. He was 180 today. 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 I think he is going to require still much longer prednisone perhaps another 3 months before attempting to taper. In the meantime his wound is a lot better.  Smaller. He is cleaning this off daily and applying topical antibiotics. When he was last in the clinic I thought about changing to [facility] and actually put in a couple of calls to dermatology although probably not during their business hours. 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 He continues on oral prednisone. He has been using triple antibiotic ointment. There is surface debris and we will transition to [facility] and have him return in 2 weeks. He has lost 30 pounds since his last appointment with lifestyle modification. He may benefit from topical steroid cream for treatment this can be considered at a later date. 08/22/17 on evaluation today patient appears to actually be doing rather well in regard to his left lateral lower extremity ulcer. He has actually been managed by Dr. Dellia Nims most recently. Patient is currently on oral steroids  at this time. This seems to have been of benefit for him. Nonetheless his last visit was actually with Leah on 08/06/17. Currently he is not utilizing any topical steroid creams although this could be of benefit as well. No fevers, chills, nausea, or vomiting noted at this time. 09/05/17 on evaluation today patient appears to be doing better in regard to his left lateral lower extremity ulcer. He has been tolerating the dressing changes without complication. He is using Santyl with good effect. Overall I'm very pleased with how things are standing at this point. Patient likewise is happy that this is doing better. 09/19/17 on evaluation today patient actually appears to be doing rather well in regard to his left lateral lower extremity ulcer. 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 He's not having any significant pain. 10/03/17 on evaluation today patient  appears to be doing excellent in regard to his lower extremity wound on the left secondary to Pyoderma gangrenosum. He has been tolerating the Santyl without complication and in general I feel like he's making good progress. 10/17/17 on evaluation today patient appears to be doing very well in regard to his left lateral lower surety ulcer. He has been tolerating the dressing changes without complication. There does not appear to be any evidence of infection he's alternating the Santyl and the triple antibiotic ointment every other day this seems to be doing well for him. 11/03/17 on evaluation today patient appears to be doing very well in regard to his left lateral lower extremity ulcer. He is been tolerating the dressing changes without complication which is good news. Fortunately there does not appear to be any evidence of infection which is also great news. Overall is doing excellent they are starting to taper down on the prednisone is down to 40 mg at this point it also started topical clobetasol for him. 11/17/17 on evaluation today patient appears to be doing well in regard to his left lateral lower surety ulcer. He's been tolerating the dressing changes without complication. He does note that he is having no pain, no excessive drainage or discharge, and overall he feels like things are going about how he would expect and hope they would. Overall he seems to have no evidence of infection at this time in my opinion which is good news. 12/04/17-He is seen in follow-up evaluation for right lateral lower extremity ulcer. He has been applying topical steroid cream. Today's measurement show slight increase in size. Over the next 2 weeks we will transition to every other day Santyl and steroid cream. He has been encouraged to monitor for changes and notify clinic with any concerns 12/15/17 on evaluation today patient's left lateral motion the ulcer and fortunately is doing worse again at this point. This  just since last week to this week has close to doubled in size according to the patient. I did not seeing last week's I do not have a visual to compare this to in our system was also down so we do not have all the charts and at this point. Nonetheless it does have me somewhat concerned in regard to the fact that again he was worried enough about it he has contact the dermatology that placed them back on the full strength, 50 mg a day of the prednisone that he was taken previous. He continues to alternate using clobetasol along with Santyl at this point. He is obviously somewhat frustrated. 12/22/17  on evaluation today patient appears to be doing a little worse compared to last evaluation. Unfortunately the wound is a little deeper and slightly larger than the last week's evaluation. With that being said he has made some progress in regard to the irritation surrounding at this time unfortunately despite that progress that's been made he still has a significant issue going on here. 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 DIONEL, ARCHEY ([id number]) For that reason I discussed with him today the possibility of perform a wound culture to ensure there's nothing overtly infected. 01/06/18 on evaluation today patient's wound is larger and deeper than previously evaluated. With that being said it did appear that his wound was infected after my last evaluation with him. Subsequently I did end up prescribing a prescription for Bactrim DS which she has been taking and having no complication with. Fortunately there does not appear to be any evidence of infection at this point in time as far as anything spreading, no want to touch, and overall I feel like things are showing signs of improvement. 01/13/18 on evaluation today patient appears to be even a little larger  and deeper than last time. There still muscle exposed in the base of the wound. Nonetheless he does appear to be less erythematous I do believe inflammation is calming down also believe the infection looks like it's probably resolved at this time based on what I'm seeing. No fevers, chills, nausea, or vomiting noted at this time. 01/30/18 on evaluation today patient actually appears to visually look better for the most part. Unfortunately those visually this looks better he does seem to potentially have what may be an abscess in the muscle that has been noted in the central portion of the wound. This is the first time that I have noted what appears to be fluctuance in the central portion of the muscle. With that being said I'm somewhat more concerned about the fact that this might indicate an abscess formation at this location. I do believe that an ultrasound would be appropriate. This is likely something we need to try to do as soon as possible. He has been switch to mupirocin ointment and he is no longer using the steroid ointment as prescribed by dermatology he sees them again next week he's been decreased from 60 to 40 mg of prednisone. 03/09/18 on evaluation today patient actually appears to be doing a little better compared to last time I saw him. There's not as much erythema surrounding the wound itself. He I did review his most recent infectious disease note which was dated 02/24/18. He saw Dr. Michel Bickers in [place]. 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 Patient is now off of antibiotics and they are continually observing this. There seems to be no change in the past two weeks in my pinion based on what the patient says and what I see today compared to what Dr. Megan Salon likely saw two weeks ago. No fevers, chills, nausea, or vomiting noted at this time. 03/23/18 on evaluation today patient's wound  actually appears to be showing signs of improvement which is good news. He is currently still on the Dapsone. He is also working on tapering the prednisone to get off of this and Dr. Lottie Rater is working with  him in this regard. Nonetheless overall I feel like the wound is doing well it does appear based on the infectious disease note that I reviewed from Dr. 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 I did also review the results of his ultrasound of left lower extremity which revealed there was a dentist tissue in the base of the wound without an abscess noted. 04/06/18 on evaluation today the patient's left lateral lower extremity ulcer actually appears to be doing fairly well which is excellent news. There does not appear to be any evidence of infection at this time which is also great news. Overall he still does have a significantly large ulceration although little by little he seems to be making progress. He is down to 10 mg a day of the prednisone. 04/20/18 on evaluation today patient actually appears to be doing excellent at this time in regard to his left lower extremity ulcer. He's making signs of good progress unfortunately this is taking much longer than we would really like to see but nonetheless he is making progress. Fortunately there does not appear to be any evidence of infection at this time. No fevers, chills, nausea, or vomiting noted at this time. The patient has not been using the Santyl due to the cost he hadn't got in this field yet. He's mainly been using the antibiotic ointment topically. Subsequently he also tells me that he really has not been scrubbing in the shower I think this would be helpful again as I told him it doesn't have to be anything too aggressive to even make it believe just enough to keep it free of some of the loose slough and biofilm on the wound  surface. 05/11/18 on evaluation today patient's wound appears to be making slow but sure progress in regard to the left lateral lower extremity ulcer. He is been tolerating the dressing changes without complication. Fortunately there does not appear to be any evidence of infection at this time. He is still just using triple antibiotic ointment along with clobetasol occasionally over the area. He never got the Santyl and really does not seem to intend to in my pinion. 06/01/18 on evaluation today patient appears to be doing a little better in regard to his left lateral lower extremity ulcer. He states that overall he does not feel like he is doing as well with the Dapsone as he did with the prednisone. Nonetheless he sees his dermatologist later today and is gonna talk to them about the possibility of going back on the prednisone. Overall again I believe that the wound would be better if you would utilize Santyl but he really does not seem to be interested in going back to the [place] at this point. He has been using triple antibiotic ointment. 06/15/18 on evaluation today patient's wound actually appears to be doing about the same at this point. Fortunately there is no signs of infection at this time. He has made slight improvements although he continues to not really want to clean the wound bed at this point. He states that he just doesn't mess with it he doesn't want to cause any problems with everything else he has going on. He has been on medication, antibiotics as prescribed by his dermatologist, for a staff infection of his lower extremities which is really drying out now and looking much better he tells me. Fortunately there is  no sign of overall infection. 06/29/18 on evaluation today patient appears to be doing well in regard to his left lateral lower surety ulcer all things considering. Fortunately his staff infection seems to be greatly improved compared to previous. He has no signs of  infection and this is drying up quite nicely. He is still the doxycycline for this is no longer on cental, Dapsone, or any of the other medications. His dermatologist has recommended possibility of an infusion but right now he does not want to proceed with that. 07/13/18 on evaluation today patient appears to be doing about the same in regard to his left lateral lower surety ulcer. Fortunately there's no signs of infection at this time which is great news. Unfortunately he still builds up a significant amount of Slough/biofilm of the surface of the wound he still is not really cleaning this as he should be appropriately. Again I'm able to easily with saline and gauze remove the majority of this on the surface which if you would do this at home would likely be a dramatic improvement for him as far as getting the area to improve. Nonetheless overall I still feel like he is making progress is just very slow. I think Santyl will be of benefit for him as well. Still he has not gotten this as of this point. 07/27/18 on evaluation today patient actually appears to be doing little worse in regards of the erythema around the periwound region of the wound he also tells me that he's been having more drainage currently compared to what he was experiencing last time I saw him. He states not quite as bad as what he had because this was infected previously but nonetheless is still appears to be doing poorly. Fortunately there is no evidence of systemic infection at this point. The patient tells me that he is not going to be able to afford the Santyl. He is still waiting to hear about the infusion therapy with his dermatologist. Apparently she wants an updated colonoscopy first. 08/10/18 on evaluation today patient appears to be doing better in regard to his left lateral lower extremity ulcer. Fortunately he is showing signs of improvement in this regard he's actually been approved for Remicade infusion's as well  although this has not been scheduled as of yet. Fortunately there's no signs of active infection at this time in regard to the wound although he is having some issues with infection of the right lower extremity is been seen as dermatologist for this. Fortunately they are definitely still working with him trying to keep things under control. STCLAIR, SZYMBORSKI ([id number]) 09/07/18 on evaluation today patient is actually doing rather well in regard to his left lateral lower extremity ulcer. He notes these actually having some hair grow back on his extremity which is something he has not seen in years. He also tells me that the pain is really not giving them any trouble at this time which is also good news overall she is very pleased with the progress he's using a combination of the mupirocin along with the probate is all mixed. 09/21/18 on evaluation today patient actually appears to be doing fairly well all things considered in regard to his looks from the ulcer. He's been tolerating the dressing changes without complication. Fortunately there's no signs of active infection at this time which is good news he is still on all antibiotics or prevention of the staff infection. He has been on prednisone for time although he states it is [place]  contact his dermatologist and see if she put them on a short course due to some irritation that he has going on currently. Fortunately there's no evidence of any overall worsening this is going very slow I think cental would be something that would be helpful for him although he states that $50 for tube is quite expensive. He therefore is not willing to get that at this point. 10/06/18 on evaluation today patient actually appears to be doing decently well in regard to his left lateral leg ulcer. He's been tolerating the dressing changes without complication. Fortunately there's no signs of active infection at this time. Overall I'm actually rather pleased with the progress  he's making although it's slow he doesn't show any signs of infection and he does seem to be making some improvement. I do believe that he may need a switch up and dressings to try to help this to heal more appropriately and quickly. 10/19/18 on evaluation today patient actually appears to be doing better in regard to his left lateral lower extremity ulcer. This is shown signs of having much less Slough buildup at this point due to the fact he has been using the [employer]. Obviously this is very good news. The overall size of the wound is not dramatically smaller but again the appearance is. 11/02/18 on evaluation today patient actually appears to be doing quite well in regard to his lower [place] ulcer. A lot of the skin around the ulcer is actually somewhat irritating at this point this seems to be more due to the dressing causing irritation from the adhesive that anything else. Fortunately there is no signs of active infection at this time. 11/24/18 on evaluation today patient appears to be doing a little worse in regard to his overall appearance of his lower extremity ulcer. There's more erythema and warmth around the wound unfortunately. He is currently on doxycycline which he has been on for some time. With that being said I'm not sure that seems to be helping with what appears to possibly be an acute cellulitis with regard to his left lower extremity ulcer. No fevers, chills, nausea, or vomiting noted at this time. 12/08/18 on evaluation today patient's wounds actually appears to be doing significantly better compared to his last evaluation. He has been using Santyl along with alternating tripling about appointment as well as the steroid cream seems to be doing quite well and the wound is showing signs of improvement which is excellent news. Fortunately there's no evidence of infection and in fact his culture came back negative with only normal skin flora noted. 12/21/2018 upon evaluation today patient  actually appears to be doing excellent with regard to his ulcer. This is actually the best that I have seen it since have been helping to take care of him. It is both smaller as well as less slough noted on the surface of the wound and seems to be showing signs of good improvement with new skin growing from the edges. He has been using just the triamcinolone he does wonder if he can get a refill of that ointment today. 01/04/2019 upon evaluation today patient actually appears to be doing well with regard to his left lateral lower extremity ulcer. With that being said it does not appear to be that he is doing quite as well as last time as far as progression is concerned. There does not appear to be any signs of infection or significant irritation which is good news. With that being said I do  believe that he may benefit from switching to a collagen based dressing based on how clean The wound appears. 01/18/2019 on evaluation today patient actually appears to be doing well with regard to his wound on the left lower extremity. He is not made a lot of progress compared to where we were previous but nonetheless does seem to be doing okay at this time which is good news. There is no signs of active infection which is also good news. My only concern currently is I do wish we can get him into utilizing the collagen dressing his insurance would not pay for the supplies that we ordered although it appears that he may be able to order this through his supply company that he typically utilizes. This is [employer]. Nonetheless he did try to order it during the office visit today and it appears this did go through. We will see if he can get that it is a different brand but nonetheless he has collagen and I do think will be beneficial. 02/01/2019 on evaluation today patient actually appears to be doing a little worse today in regard to the overall size of his wounds. Fortunately there is no signs of active infection at  this time. That is visually. Nonetheless when this is happened before it was due to infection. For that reason were somewhat concerned about that this time as well. 02/08/2019 on evaluation today patient unfortunately appears to be doing slightly worse with regard to his wound upon evaluation today. Is measuring a little deeper and a little larger unfortunately. I am not really sure exactly what is causing this to enlarge he actually did see his dermatologist she is going to see about initiating Humira for him. Subsequently she also did do steroid injections into the wound itself in the periphery. Nonetheless still nonetheless he seems to be getting a little bit larger he is gone back to just using the steroid cream topically which I think is appropriate. I would say hold off on the collagen for the time being is definitely a good thing to do. Based on the culture results which we finally did get the final result back regarding it shows staph as the bacteria noted again that can be a normal skin bacteria based on the fact however he is having increased drainage and worsening of the wound measurement wise I would go ahead and place him on an antibiotic today I do believe for this. 02/15/2019 on evaluation today patient actually appears to be doing somewhat better in regard to his ulcer. There is no signs of worsening at this time I did review his culture results which showed evidence of Staphylococcus aureus but not MRSA. Again this could just be more related to the normal skin bacteria although he states the drainage has slowed down quite a bit he may have had a mild infection not just colonization. And was much smaller and then since around10/04/2019 on evaluation today patient appears to be doing unfortunately worse as far as the size of the wound. I really feel like that this is steadily getting larger again it had been doing excellent right at the beginning of September we have seen a steady  increase in the area of the wound it is almost 2-1/2 times the size it was on September 1. Obviously this is a bad trend this is not wanting to see. For that reason we went back to using just the topical triamcinolone cream which does seem to help with inflammation. I checked him  for bacteria by way of culture and nothing showed positive there. I am considering giving him a short course of a tapering steroid Dosepak today to see if that is can be beneficial for him. The patient is in agreement with giving that a try. 03/08/2019 on evaluation today patient appears to be doing very well in comparison to last evaluation with regard to his lower extremity ulcer. This is showing signs of less inflammation and actually measuring slightly smaller compared to last time every other week over the past month and a half he has been measuring larger larger larger. Nonetheless I do believe that the issue has been inflammation the prednisone does seem to [facility] [place] ([id number]) have been beneficial for him which is good news. No fevers, chills, nausea, vomiting, or diarrhea. 03/22/2019 on evaluation today patient appears to be doing about the same with regard to his leg ulcer. He has been tolerating the dressing changes without complication. With that being said the wound seems to be mostly arrested at its current size but really is not making any progress except for when we prescribed the prednisone. He did show some signs of dropping as far as the overall size of the wound during that interval week. Nonetheless this is something he is not on long-term at this point and unfortunately I think he is getting need either this or else the Humira which his dermatologist has discussed try to get approval for. With that being said he will be seeing his dermatologist on the 11th of this month that is November. 04/19/2019 on evaluation today patient appears to be doing really about the same the wound is measuring slightly  larger compared to last time I saw him. He has not been into the office since November 2 due to the fact that he unfortunately had Covid as that his entire family. He tells me that it was rough but they did pull-through and he seems to be doing much better. Fortunately there is no signs of active infection at this time. No fevers, chills, nausea, vomiting, or diarrhea. 05/10/2019 on evaluation today patient unfortunately appears to be doing significantly worse as compared to last time I saw him. He does tell me that he has had his first dose of Humira and actually is scheduled to get the next one in the upcoming week. With that being said he tells me also that in the past several days he has been having a lot of issues with green drainage she showed me a picture this is more blue-green in color. He is also been having issues with increased sloughy buildup and the wound does appear to be larger today. Obviously this is not the direction that we want everything to take based on the starting of his Humira. Nonetheless I think this is definitely a result of likely infection and to be honest I think this is probably Pseudomonas causing the infection based on what I am seeing. 05/24/2019 on evaluation today patient unfortunately appears to be doing significantly worse compared to his prior evaluation with me 2 weeks ago. I did review his culture results which showed that he does have Staph aureus as well as Pseudomonas noted on the culture. Nonetheless the Levaquin that I prescribed for him does not appear to have been appropriate and in fact he tells me he is no longer experiencing the green drainage and discharge that he had at the last visit. Fortunately there is no signs of active infection at this time which is good  news although the wound has significantly worsened it in fact is much deeper than it was previous. We have been utilizing up to this point triamcinolone ointment as the prescription topical  of choice but at this time I really feel like that the wound is getting need to be packed in order to appropriately manage this due to the deeper nature of the wound. Therefore something along the lines of an alginate dressing may be more appropriate. 05/31/2019 upon inspection today patient's wound actually showed signs of doing poorly at this point. Unfortunately he just does not seem to be making any good progress despite what we have tried. He actually did go ahead and pick up the Cipro and start taking that as he was noticing more green drainage he had previously completed the Levaquin that I prescribed for him as well. Nonetheless he missed his appointment for the seventh last week on Wednesday with the wound care center and [facility] where his dermatologist referred him. Obviously I do think a second opinion would be helpful at this point especially in light of the fact that the patient seems to be doing so poorly despite the fact that we have tried everything that I really know how at this point. The only thing that ever seems to have helped him in the past is when he was on high doses of continual steroids that did seem to make a difference for him. 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 06/07/2019 upon evaluation today patient unfortunately appears to be doing worse yet again with regard to his wound. In fact I am starting to question whether or not he may have a fluid pocket in the muscle at this point based on the bulging and the soft appearance to the central portion of the muscle area. There is not anything draining from the muscle itself at this time which is good news but nonetheless the wound is expanding. I am not really seeing any results of the Humira as far as overall wound progression based on what I am seeing at this  point. The patient has been referred for second opinion with regard to his wound to the [facility] wound care center by his dermatologist which I definitely am not in opposition to. Unfortunately we tried multiple dressings in the past including collagen, alginate, and at one point even Hydrofera Blue. With that being said he is never really used it for any significant amount of time due to the fact that he often complains of pain associated with these dressings and then will go back to either using the Santyl which she has done intermittently or more frequently the triamcinolone. He is also using his own compression stockings. We have wrapped him in the past but again that was something else that he really was not a big fan of. 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 He seemed to do much better when he was on the steroids although honestly  I understand that the steroids are not necessarily the best medication to be on long-term obviously 06/14/2019 on evaluation today patient appears to be doing actually a little bit better with regard to the overall appearance with his leg. Unfortunately he does continue to have issues with what appears to be some fluid underneath the muscle although he did see the wound specialty center at [facility] last week their main goals were to see about infusion therapy in place of the Humira as they feel like that is not quite strong enough. They also recommended that we continue with the treatment otherwise as we are they felt like that was appropriate and they are okay with him continuing to follow-up here with [place] in that regard. With that being said they are also  sending him to the vein specialist there to see about vein stripping and if that would be of benefit for him. Subsequently they also did not really address whether or not an ultrasound of the muscle area to see if there is anything that needs to be addressed here would be appropriate or not. For that reason I discussed this with him last week I think we may proceed down that road at this point. 06/21/2019 upon evaluation today patient's wound actually appears to be doing slightly better compared to previous evaluations. I do believe that he has made a difference with regard to the progression here with the use of oral steroids. Again in the past has been the only thing that is really calm things down. He does tell me that from [facility] is gotten a good news from there that there are no further vein stripping that is necessary at this point. I do not have that available for review today although the patient did relay this to me. He also did obtain and have the ultrasound of the wound completed which I did sign off on today. It does appear that there is no fluid collection under the muscle this is likely then just edematous tissue in general. That is also good news. Overall I still believe the inflammation is the main issue here. He did inquire about the possibility of a wound VAC again with the muscle protruding like it is I am not really sure whether the wound VAC is necessarily ideal or not. That is something we will have to consider although I do believe he may need compression wrapping to try to help with edema control which could potentially be of benefit. 06/28/2019 on evaluation today patient appears to be doing slightly better measurement wise although this is not terribly smaller he least seems to be trending towards that direction. With that being said he still seems to have purulent drainage noted in the wound bed at this time. He has been on Levaquin followed by Cipro over the past month.  Unfortunately he still seems to have some issues with active infection at this time. I did perform a culture last week in order to evaluate and see if indeed there was still anything going on. Subsequently the culture did come back showing Pseudomonas which is consistent with the drainage has been having which is blue-green in color. He also has had an odor that again was somewhat consistent with Pseudomonas as well. Long story short it appears that the culture showed an intermediate finding with regard to how well the Cipro will work for the Pseudomonas infection. Subsequently being that he does not seem to be clearing up and at best what we are doing is  just keeping this at [place] I think he may need to see infectious disease to discuss IV antibiotic options. OVAL, CAVAZOS ([id number]) 07/05/2019 upon evaluation today patient appears to be doing okay in regard to his leg ulcer. He has been tolerating the dressing changes at this point without complication. Fortunately there is no signs of active infection at this time which is good news. No fevers, chills, nausea, vomiting, or diarrhea. With that being said he does have an appointment with infectious disease tomorrow and his primary care on Wednesday. Again the reason for the infectious disease referral was due to the fact that he did not seem to be fully resolving with the use of oral antibiotics and therefore we were thinking that IV antibiotic therapy may be necessary secondary to the fact that there was an intermediate finding for how effective the Cipro may be. Nonetheless again he has been having a lot of purulent and even green drainage. Fortunately right now that seems to have calmed down over the past week with the reinitiation of the oral antibiotic. Nonetheless we will see what Dr. Megan Salon has to say. 07/12/2019 upon evaluation today patient appears to be doing about the same at this point in regard to his left lower extremity ulcer.  Fortunately there is no signs of active infection at this time which is good news I do believe the Levaquin has been beneficial I did review Dr. Hale Bogus note and to be honest I agree that the patient's leg does appear to be doing better currently. What we found in the past as he does not seem to really completely resolve he will stop the antibiotic and then subsequently things will revert back to having issues with blue-green drainage, increased pain, and overall worsening in general. Obviously that is the reason I sent him back to infectious disease. 07/19/2019 upon evaluation today patient appears to be doing roughly the same in size there is really no dramatic improvement. He has started back on the Levaquin at this point and though he seems to be doing okay he did still have a lot of blue/green drainage noted on evaluation today unfortunately. I think that this is still indicative more likely of a Pseudomonas infection as previously noted and again he does see Dr. Megan Salon in just a couple of days. I do not know that were really able to effectively clear this with just oral antibiotics alone based on what I am seeing currently. Nonetheless we are still continue to try to manage as best we can with regard to the patient and his wound. I do think the wrap was helpful in decreasing the edema which is excellent news. No fevers, chills, nausea, vomiting, or diarrhea. 07/26/2019 upon evaluation today patient appears to be doing slightly better with regard to the overall appearance of the muscle there is no dark discoloration centrally. Fortunately there is no signs of active infection at this time. No fevers, chills, nausea, vomiting, or diarrhea. Patient's wound bed currently the patient did have an appointment with Dr. Megan Salon at [facility] last week. With that being said Dr. Megan Salon the patient states was still somewhat hesitant about put him on any IV antibiotics he wanted [place] to repeat  cultures today and then see where things go going forward. He does look like Dr. Megan Salon because of some improvement the patient did have with the Levaquin wanted [place] to see about repeating cultures. If it indeed grows the Pseudomonas again then he recommended a possibility of considering a PICC  line placement and IV antibiotic therapy. He plans to see the patient back in 1 to 2 weeks. 08/02/2019 upon evaluation today patient appears to be doing poorly with regard to his left lower extremity. We did get the results of his culture back it shows that he is still showing evidence of Pseudomonas which is consistent with the purulent/blue-green drainage that he has currently. Subsequently the culture also shows that he now is showing resistance to the oral fluoroquinolones which is unfortunate as that was really the only thing to treat the infection prior. I do believe that he is looking like this is going require IV antibiotic therapy to get this under control. Fortunately there is no signs of systemic infection at this time which is good news. The patient does see Dr. Megan Salon tomorrow. 08/09/2019 upon evaluation today patient appears to be doing better with regard to his left lower extremity ulcer in regard to the overall appearance. He is currently on IV antibiotic therapy. As ordered by Dr. Megan Salon. Currently the patient is on ceftazidime which she is going to take for the next 2 weeks and then follow-up for 4 to 5-week appointment with Dr. Megan Salon. The patient started this this past Friday symptoms have not for a total of 3 days currently in full. 08/16/2019 upon evaluation today patient's wound actually does show muscle in the base of the wound but in general does appear to be much better as far as the overall evidence of infection is concerned. In fact I feel like this is for the most part cleared up he still on the IV antibiotics he has not completed the full course yet but I think he is doing much  better which is excellent news. 08/23/2019 upon evaluation today patient appears to be doing about the same with regard to his wound at this point. He tells me that he still has pain unfortunately. Fortunately there is no evidence of systemic infection at this time which is great news. There is significant muscle protrusion. 09/13/19 upon evaluation today patient appears to be doing about the same in regard to his leg unfortunately. He still has a lot of drainage coming from the ulceration there is still muscle exposed. With that being said the patient's last wound culture still showed an intermediate finding with regard to the Pseudomonas he still having the bluish/green drainage as well. Overall I do not know that the wound has completely cleared of infection at this point. Fortunately there is no signs of active infection systemically at this point which is good news. 09/20/2019 upon evaluation today patient's wound actually appears to be doing about the same based on what I am seeing currently. I do not see any signs of systemic infection he still does have evidence of some local infection and drainage. He did see Dr. Megan Salon last week and Dr. Megan Salon states that he probably does need a different IV antibiotic although he does not want to put him on this until the patient begins the Remicade infusion which is actually scheduled for about 10 days out from today on 13 May. Following that time Dr. Megan Salon is good to see him back and then will evaluate the feasibility of starting him on the IV antibiotic therapy once again at that point. I do not disagree with this plan I do believe as Dr. Megan Salon stated in his note that I reviewed today that the patient's issue is multifactorial with the pyoderma being 1 aspect of this that were hoping the Remicade will  be helpful for her. In the meantime I think the gentamicin is, helping to keep things under decent okay control in regard to the ulcer. 09/27/2019 upon  evaluation today patient appears to be doing about the same with regard to his wound still there is a lot of muscle exposure though he does have some hyper granulation tissue noted around the edge and actually some granulation tissue starting to form over the muscle which is actually good news. Fortunately there is no evidence of active infection which is also good news. His pain is less at this point. 5/21; this is a patient I have not seen in a long time. 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 He has a large wound on the left lateral leg with protruding muscle. He comes in the clinic today showing the same area on his left medial ankle. He says there is been a spot there for some time although we have not previously defined this. Today he has a clearly defined area with slight amount of skin breakdown surrounded by raised areas with a purplish hue in color. This is not painful he says it is irritated. 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 He still has muscle protruding from the lateral portion of his left leg but fortunately the new area that they were concerned about at his last visit does not appear to have opened at this point. He is currently on Remicade infusions and seems to be doing better in my opinion in fact the wound itself seems to be overall much better. The purplish discoloration that he did have seems to have resolved and I think that is a good sign that hopefully the Remicade is doing its job. He does have some biofilm noted over the surface of the wound. 11/01/2019 on evaluation today patient's wound actually appears to be doing excellent at this time. Fortunately there is no evidence of active infection and overall I feel like he is making great progress. The Remicade seems to be due excellent job in my opinion. JIMMEY, HENGEL  ([id number]) 11/08/19 evaluation today vision actually appears to be doing quite well with regard to his weight ulcer. He's been tolerating dressing changes without complication. Fortunately there is no evidence of infection. No fevers, chills, nausea, or vomiting noted at this time. Overall states that is having more itching than pain which is actually a good sign in my opinion. 12/13/2019 upon evaluation today patient appears to be doing well today with regard to his wound. He has been tolerating the dressing changes without complication. Fortunately there is no sign of active infection at this time. No fevers, chills, nausea, vomiting, or diarrhea. Overall I feel like the infusion therapy has been very beneficial for him. 01/06/2020 on evaluation today patient appears to be doing well with regard to his wound. This is measuring smaller and actually looks to be doing better. Fortunately there is no signs of active infection at this point. No fevers, chills, nausea, vomiting, or diarrhea. With that being said he does still have the blue-green drainage but this does not seem to be causing any significant issues currently. He has been using the gentamicin that does seem to be keeping things under decent control at this point. He goes later this morning for his next infusion therapy for the pyoderma which seems to also be very beneficial. 02/07/2020 on evaluation today patient appears to  be doing about the same in regard to his wounds currently. Fortunately there is no signs of active infection systemically he does still have evidence of local infection still using gentamicin. He also is showing some signs of improvement albeit slowly I do feel like we are making some progress here. 02/21/2020 upon evaluation today patient appears to be making some signs of improvement the wound is measuring a little bit smaller which is great news and overall I am very pleased with where he stands currently. He is going to  be having infusion therapy treatment on the 15th of this month. Fortunately there is no signs of active infection at this time. 03/13/2020 I do believe patient's wound is actually showing some signs of improvement here which is great news. He has continue with the infusion therapy through rheumatology/dermatology at [facility]. That does seem to be beneficial. I still think he gets as much benefit from this as he did from the prednisone initially but nonetheless obviously this is less harsh on his body that the prednisone as far as they are concerned. 03/31/2020 on evaluation today patient's wound actually showing signs of some pretty good improvement in regard to the overall appearance of the wound bed. There is still muscle exposed though he does have some epithelial growth around the edges of the wound. Fortunately there is no signs of active infection at this time. No fevers, chills, nausea, vomiting, or diarrhea. 04/24/2020 upon evaluation today patient appears to be doing about the same in regard to his leg ulcer. He has been tolerating the dressing changes without complication. Fortunately there is no signs of active infection at this time. No fevers, chills, nausea, vomiting, or diarrhea. With that being said he still has a lot of irritation from the bandaging around the edges of the wound. We did discuss today the possibility of a referral to plastic surgery. 05/22/2020 on evaluation today patient appears to be doing well with regard to his wounds all things considered. He has not been able to get the Chantix apparently there is a recall nurse that I was unaware of put out by [employer] involuntarily. Nonetheless for now I am and I have to do some research into what may be the best option for him to help with quitting in regard to smoking and we discussed that today. 06/26/2020 upon evaluation today patient appears to be doing well with regard to his wound from the standpoint of infection I do not see any  signs of infection at this point. With that being said unfortunately he is still continuing to have issues with muscle exposure and again he is not having a whole lot of new skin growth unfortunately. There does not appear to be any signs of active infection at this time. No fevers, chills, nausea, vomiting, or diarrhea. 07/10/2020 upon evaluation today patient appears to be doing a little bit more poorly currently compared to where he was previous. I am concerned currently about an active infection that may be getting worse especially in light of the increased size and tenderness of the wound bed. No fevers, chills, nausea, vomiting, or diarrhea. 07/24/2020 upon evaluation today patient appears to be doing poorly in regard to his leg ulcer. He has been tolerating the dressing changes without complication but unfortunately is having a lot of discomfort. 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 Subsequently I think he is going require possibly IV antibiotics to get this under control. I am  very concerned about the severity of his infection and the amount of discomfort he is having. 07/31/2020 upon evaluation today patient appears to be doing about the same in regard to his leg wound. He did see Dr. Megan Salon and Dr. Megan Salon is actually going to start him on IV antibiotics. He goes for the PICC line tomorrow. With that being said there do not have that run for 2 weeks and then see how things are doing and depending on how he is progressing they may extend that a little longer. Nonetheless I am glad this is getting ready to be in place and definitely feel it may help the patient. In the meantime is been using mainly triamcinolone to the wound bed has an anti-inflammatory. 08/07/2020 on evaluation today patient appears to be doing well with regard to his wound compared even last week. In the interim he has gotten the PICC line placed and  overall this seems to be doing excellent. There does not appear to be any evidence of infection which is great news systemically although locally of course has had the infection this appears to be improving with the use of the antibiotics. 08/14/2020 upon evaluation today patient's wound actually showing signs of excellent improvement. Overall the irritation has significantly improved the drainage is back down to more of a normal level and his pain is really pretty much nonexistent compared to what it was. Obviously I think that this is significantly improved secondary to the IV antibiotic therapy which has made all the difference in the world. Again he had a resistant form of Pseudomonas for which oral antibiotics just was not cutting it. Nonetheless I do think that still we need to consider the possibility of a surgical closure for this wound is been open so long and to be honest with muscle exposed I think this can be very hard to get this to close outside of this although definitely were still working to try to do what we can in that regard. 08/21/2020 upon evaluation today patient appears to be doing very well with regard to his wounds on the left lateral lower extremity/calf area. Fortunately there does not appear to be signs of active infection which is great news and overall very pleased with where things stand today. He is actually wrapping up his treatment with IV antibiotics tomorrow. After that we will see where things go from there. 08/28/2020 upon evaluation today patient appears to be doing decently well with regard to his leg ulcer. There does not appear to be any signs of active infection which is great news and overall very pleased with where things stand today. No fevers, chills, nausea, vomiting, or diarrhea. 09/18/2020 upon evaluation today patient appears to be doing well with regard to his infection which I feel like is better. Unfortunately he is not doing as well with regard to the  overall size of the wound which is not nearly as good at this point. I feel like that he may be having an issue here with the pyoderma being somewhat out of control. I think that he may benefit from potentially going back and talking to the dermatologist about RAINEN, VANROSSUM ([id number]) what to do from the pyoderma standpoint. I am not certain if the infusions are helping nearly as much is what the prednisone did in the past. 10/02/2020 upon evaluation today patient appears to be doing well with regard to his leg ulcer. He did go to the [facility]iatric nurse. Unfortunately they feel like there is a  10% chance that most that he would be able to heal and that the skin graft would take. Obviously this has led him to not be able to go down that path as far as treatment is concerned. Nonetheless he does seem to be doing a little bit better with the prednisone that I gave him last time. I think that he may need to discuss with dermatology the possibility of long-term prednisone as that seems to be what is most helpful for him to be perfectly honest. I am not sure the Remicade is really doing the job. 10/17/2020 upon evaluation today patient appears to be doing a little better in regard to his wound. In fact the case has been since we did the prednisone on May 2 for him that we have noticed a little bit of improvement each time we have seen a size wise as well as appearance wise as well as pain wise. I think the prednisone has had a greater effect then the infusion therapy has to be perfectly honest. With that being said the patient also feels significantly better compared to what he was previous. All of this is good news but nonetheless I am still concerned about the fact that again we are really not set up to long-term manage him as far as prednisone is concerned. Obviously there are things that you need to be watched I completely understand the risk of prednisone usage as well. That is why has been doing the  infusion therapy to try and control some of the pyoderma. With all that being said I do believe that we can give him another round of the prednisone which she is requesting today because of the improvement that he seen since we did that first round. 10/30/2020 upon evaluation today patient's wound actually is showing signs of doing quite well. There does not appear to be any evidence of infection which is great news and overall very pleased with where things stand today. No fevers, chills, nausea, vomiting, or diarrhea. He tells me that the prednisone still has seem to have helped he wonders if we can extend that for just a little bit longer. He did not have the appointment with a dermatologist although he did have an infusion appointment last Friday. That was at [facility]. With that being said he tells me he could not do both that as well as the appointment with the physician on the same day therefore that is can have to be rescheduled. I really want to see if there is anything they feel like that could be done differently to try to help this out as I am not really certain that the infusions are helping significantly here. 11/13/2020 upon evaluation today patient unfortunately appears to be doing somewhat poorly in regard to his wound I feel like this is actually worsening from the standpoint of the pyoderma spreading. I still feel like that he may need something different as far as trying to manage this going forward. Again we did the prednisone unfortunately his blood sugars are not doing so well because of this. Nonetheless I believe that the patient likely needs to try topical steroid. We have done triamcinolone for a while I think going with something stronger such as clobetasol could be beneficial again this is not something I do lightly I discussed this with the patient that again this does not normally put underneath an occlusive dressing. Nonetheless I think a thin film as such could help with some  of the stronger anti-inflammatory  effects. We discussed this today. He would like to try to give this a trial for the next couple weeks. I definitely think that is something that we can do. Evaluate7/03/2021 and today patient's wound bed actually showed signs of doing really about the same. There was a little expansion of the size of the wound and that leading edge that we done looking out although the clobetasol does seem to have slowed this down a bit in my opinion. There is just 1 small area that still seems to be progressing based on what I see. Nonetheless I am concerned about the fact this does not seem to be improving if anything seems to be doing a little bit worse. I do not know that the infusions are really helping him much as next infusion is August 5 his appointment with dermatology is July 25. Either way I really think that we need to have a conversation potentially about this and I am actually going to see if I can talk with Dr. Lillia Carmel in order to see where things stand as well. 12/11/2020 upon evaluation today patient appears to be doing worse in regard to his leg ulcer. Unfortunately I just do not think this is making the progress that I would like to see at this point. Honestly he does have an appointment with dermatology and this is in 2 days. I am wondering what they may have to offer to help with this. Right now what I am seeing is that he is continuing to show signs of worsening little by little. Obviously that is not great at all. Is the exact opposite of what we are looking for. 12/18/2020 upon evaluation today patient appears to be doing a little better in regard to his wound. The dermatologist actually did do some steroid injections into the wound which does seem to have been beneficial in my opinion. That was on the 25th already this looks a little better to me than last time I saw him. With that being said we did do a culture and this did show that he has Staph aureus noted  in abundance in the wound. With that being said I do think that getting him on an oral antibiotic would be appropriate as well. Also think we can compression wrap and this will make a difference as well. 12/28/2020 upon evaluation today patient's wound is actually showing signs of doing much better. I do believe the compression wrap is helping he has a lot of drainage but to be honest I think that the compression is helping to some degree in this regard as well as not draining through which is also good news. No fevers, chills, nausea, vomiting, or diarrhea. 01/04/2021 upon evaluation today patient appears to be doing well with regard to his wound. Overall things seem to be doing quite well. He did have a little bit of reaction to the CarboFlex Sorbact he will be using that any longer. With that being said he is controlled as far as the drainage is concerned overall and seems to be doing quite well. I do not see any signs of active infection at this time which is great news. No fevers, chills, nausea, vomiting, or diarrhea. 01/11/2021 upon evaluation today patient appears to be doing well with regard to his wounds. He has been tolerating the dressing changes without complication. Fortunately there does not appear to be any signs of active infection at this time which is great news. Overall I am extremely pleased with where we stand  currently. No fevers, chills, nausea, vomiting, or diarrhea. Where using clobetasol in the wound bed he has a lot of new skin growth which is awesome as well. 01/18/2021 upon evaluation today patient appears to be doing very well in regard to his leg ulcer. He has been tolerating the dressing changes without complication. Fortunately there does not appear to be any signs of active infection which is great news. In general I think that he is making excellent progress 01/25/2021 upon evaluation today patient appears to be doing well with regard to his wound on the leg. I am  actually extremely pleased with where things stand today. There does not appear to be any signs of active infection which is great news and overall I think that we are definitely headed in the appropriate direction based on what I am seeing currently. There does not appear to be any signs of active infection also excellent news. 02/06/2021 upon evaluation today patient appears to be doing well with regard to his wound. Overall visually this is showing signs of significant improvement which is great news. I do not see any signs of active infection systemically which is great even locally I do not think that we are seeing any major complications here. We did do fluorescence imaging with the [employer] today. The patient does have some odor and drainage noted and again this is something that I think would benefit him to probably come more frequently for nurse visits. 02/19/2021 upon evaluation today patient actually appears to be doing quite well in regard to his wound. He has been tolerating the dressing Topor, Kamen ([id number]) changes without complication and overall I think that this is making excellent progress. I do not see any evidence of active infection at this point which is great news as well. No fevers, chills, nausea, vomiting, or diarrhea. 10/10; wound is made nice progress healthy granulation with a nice rim of epithelialization which seems to be expanding even from last week he has a deeper area in the inferior part of the more distal part of the wound with not quite as healthy as surface. This area will need to be followed. Using clobetasol and Hydrofera Blue 03/05/2021 upon evaluation today patient appears to be doing very well in regard to his leg ulcer. He has been tolerating dressing changes without complication. Fortunately there does not appear to be any signs of active infection which is great news and overall I am extremely pleased with where we stand currently. 03/12/2021  upon evaluation today patient appears to be doing well with regard to his wound in fact this is extremely extremely good based on what we are seeing today there does not appear to be any signs of active infection and overall I think that he is doing awesome from the standpoint of healing in general. I am extremely pleased with how things seem to be progressing with regard to this pyoderma. Clobetasol has done wonders for him. I think the compression wrapping has also been of great benefit. 03/19/2021 upon evaluation today patient appears to be doing well with regard to his wound. He is tolerating the dressing changes without complication. In fact I feel like that he is actually making excellent progress at this point based on what I am seeing. No fevers, chills, nausea, vomiting, or diarrhea. 03/26/2021 upon evaluation today patient appears to be doing well with regard to his wound. This again is measuring smaller and looking better. Again the progress is slow but nonetheless continual with what  we have been seeing. I do believe that the current plan is doing awesome for him. 04/09/2021 upon evaluation today patient appears to be doing well with regard to his leg ulcer. This is showing signs of excellent improvement the muscle is completely closed over and there does not appear to be any evidence of inflammation at this point his drainage is significantly improved. Overall I think that he would be a good candidate for looking into a skin substitute at this point as well. We will get a look into some approvals in that regard. Potentially TheraSkin as well as Apligraf could both be considered just depending on insurance coverage. 04/16/2021 upon evaluation today patient appears to be doing well at this point. He has been approved for the Apligraf which we could definitely order although I would like to try to get the TheraSkin approved if at all possible. I did fax notes into them today and I Georgina Peer try to  give a call as well. Overall the wound appears to be doing decently well today. 04/20/2021 upon evaluation today patient actually appears to be doing quite well in regard to his wound with that being said we are trying to see what we can do about speeding up the healing process. For that reason we did discuss the possibility of a skin substitute. We got the Apligraf approved. For that reason we will get a go ahead and see what we can do with the Apligraf at this point. I am still trying to get the TheraSkin approved but I have not heard anything from the insurance company yet I have called and talked to them earlier in the week and to be honest this was about half an hour that I spent on the phone they told me I would hear something within 10-15 business days. 04/27/2021 upon evaluation today patient appears to be doing excellent in regard to his wound. I do believe the Apligraf has been beneficial. With that being said he has had a little bit of increased pain has been a little bit concerned. I do want to go ahead and apply his steroid cream today the clobetasol and then put the Apligraf over I just do not think I want to risk not putting the clobetasol on based on what is been going on here currently. Patient voiced understanding. 05/04/2021 upon evaluation today patient is making excellent improvement. Overall there is definitely decrease in the size of the wound today and I am very pleased in that regard. I do not see any signs of active infection locally nor systemically at this point. No fevers, chills, nausea, vomiting, or diarrhea. Electronic Signature(s) Signed: 05/04/2021 8:43:12 AM By: [name] Keeler PA-C Entered By: [name] on 05/04/2021 08:43:12 BRAZEN, DOMANGUE ([id number]) -------------------------------------------------------------------------------- Physical Exam Details Patient Name: [name] Date of Service: 05/04/2021 8:00 AM Medical Record Number:  [id number] Patient Account Number: [id number] Date of Birth/Sex: 1980-10-23 (40 y.o. M) Treating RN: Donnamarie Poag Primary Care Provider: [name] Other Clinician: Referring Provider: [name] Treating Provider/Extender: [name] Cliche in Treatment: 18 Constitutional Well-nourished and well-hydrated in no acute distress. Respiratory normal breathing without difficulty. Psychiatric this patient is able to make decisions and demonstrates good insight into disease process. Alert and Oriented x 3. pleasant and cooperative. Notes Patient's wound bed showed signs of good granulation and epithelization at this point. I do not see any signs of active currently and I think that to be honest the patient has done extremely well with the Apligraf.  Put in the clobetasol down first did not seem to cause any complication or problem. Electronic Signature(s) Signed: 05/04/2021 8:43:36 AM By: [name] Keeler PA-C Entered By: [name] on 05/04/2021 08:43:36 Lucus, Aeson ([id number]) -------------------------------------------------------------------------------- Physician Orders Details Patient Name: [name] Date of Service: 05/04/2021 8:00 AM Medical Record Number: [id number] Patient Account Number: [id number] Date of Birth/Sex: 08-Jan-1981 (40 y.o. M) Treating RN: [name] Primary Care Provider: [name] Other Clinician: Referring Provider: [name] Treating Provider/Extender: [name] Cliche in Treatment: [phone] Verbal / Phone Orders: No Diagnosis Coding ICD-10 Coding Code Description I87.2 Venous insufficiency (chronic) (peripheral) L97.222 Non-pressure chronic ulcer of left calf with fat layer exposed E11.622 Type 2 diabetes mellitus with other skin ulcer L88 Pyoderma gangrenosum F17.208 Nicotine dependence, unspecified, with other nicotine-induced disorders Follow-up Appointments o Return Appointment in 1 week. o Nurse Visit as needed - twice  a week Bathing/ Shower/ Hygiene o Clean wound with Normal Saline or wound cleanser. Cellular or Tissue Based Products o Cellular or Tissue Based Product Type: - APLIGRAF #4 o Cellular or Tissue Based Product applied to wound bed; including contact layer, fixation with steri-strips, dry gauze and cover dressing. (DO NOT REMOVE). Edema Control - Lymphedema / Segmental Compressive Device / Other o Optional: One layer of unna paste to top of compression wrap (to act as an anchor). o Elevate, Exercise Daily and Avoid Standing for Long Periods of Time. o Elevate legs to the level of the heart and pump ankles as often as possible o Elevate leg(s) parallel to the floor when sitting. Wound Treatment Wound #1 - Lower Leg Wound Laterality: Left, Lateral Cleanser: Wound Cleanser 3 x Per Week/30 Days Discharge Instructions: Wash your hands with soap and water. Remove old dressing, discard into plastic bag and place into trash. Cleanse the wound with Wound Cleanser prior to applying a clean dressing using gauze sponges, not tissues or cotton balls. Do not scrub or use excessive force. Pat dry using gauze sponges, not tissue or cotton balls. Peri-Wound Care: Moisturizing Lotion 3 x Per Week/30 Days Discharge Instructions: Suggestions: Theraderm, Eucerin, Cetaphil, or patient preference. Secondary Dressing: Xtrasorb Medium 4x5 (in/in) 3 x Per Week/30 Days Discharge Instructions: Apply to wound as directed. Do not cut. Compression Wrap: Medichoice 4 layer Compression System, 35-40 mmHG (Generic) 3 x Per Week/30 Days Discharge Instructions: Apply multi-layer wrap as directed. Electronic Signature(s) Signed: 05/04/2021 9:00:56 AM By: [name], [name], RN, [name], [name], [name] Signed: 05/04/2021 5:41:04 PM By: [name] Keeler PA-C Entered By: [name] [name], RN, [name], [name] on 05/04/2021 09:00:55 GABREAL, WORTON ([id number]) -------------------------------------------------------------------------------- Problem  List Details Patient Name: [name] Date of Service: 05/04/2021 8:00 AM Medical Record Number: [id number] Patient Account Number: [id number] Date of Birth/Sex: 1980-12-04 (40 y.o. M) Treating RN: Donnamarie Poag Primary Care Provider: [name] Other Clinician: Referring Provider: [name] Treating Provider/Extender: [name] Cliche in Treatment: [phone] Active Problems ICD-10 Encounter Code Description Active Date MDM Diagnosis I87.2 Venous insufficiency (chronic) (peripheral) 12/04/2016 No Yes L97.222 Non-pressure chronic ulcer of left calf with fat layer exposed 12/04/2016 No Yes E11.622 Type 2 diabetes mellitus with other skin ulcer 04/09/2021 No Yes L88 Pyoderma gangrenosum 03/26/2017 No Yes F17.208 Nicotine dependence, unspecified, with other nicotine-induced disorders 04/24/2020 No Yes Inactive Problems ICD-10 Code Description Active Date Inactive Date L97.213 Non-pressure chronic ulcer of right calf with necrosis of muscle 04/02/2017 04/02/2017 Resolved Problems ICD-10 Code Description Active Date Resolved Date L97.321 Non-pressure chronic ulcer of left ankle limited to breakdown of skin 10/08/2019  10/08/2019 L03.116 Cellulitis of left lower limb 05/24/2019 05/24/2019 Electronic Signature(s) Signed: 05/04/2021 8:06:35 AM By: [name] Keeler PA-C Entered By: [name] on 05/04/2021 08:06:34 LATHEN, SEAL ([id number]) -------------------------------------------------------------------------------- Progress Note Details Patient Name: [name] Date of Service: 05/04/2021 8:00 AM Medical Record Number: [id number] Patient Account Number: [id number] Date of Birth/Sex: 06-Aug-1980 (40 y.o. M) Treating RN: Donnamarie Poag Primary Care Provider: [name] Other Clinician: Referring Provider: [name] Treating Provider/Extender: [name] Cliche in Treatment: 230 Subjective Chief Complaint Information obtained from Patient He is here in follow up  evaluation for LLE pyoderma ulcer History of Present Illness (HPI) 12/04/16; 40 year old man who comes into the clinic today for review of a wound on the posterior left calf. He tells me that is been there for about a year. He is not a diabetic he does smoke half a pack per day. He was seen in the ER on 11/20/16 felt to have cellulitis around the wound and was given clindamycin. An x-ray did not show osteomyelitis. The patient initially tells me that he has a milk allergy that sets off a pruritic itching rash on his lower legs which she scratches incessantly and he thinks that's what may have set up the wound. He has been using various topical antibiotics and ointments without any effect. He works in a [employer] and is on his feet all day. He does not have a prior history of wounds however he does have the rash on both lower legs the right arm and the ventral aspect of his left arm. These are excoriations and clearly have had scratching however there are of macular looking areas on both legs including a substantial larger area on the right leg. This does not have an underlying open area. There is no blistering. The patient tells me that 2 years ago in [place] in response to the rash on his legs he saw a dermatologist who told him he had a condition which may be pyoderma gangrenosum although I may be putting words into his mouth. He seemed to recognize this. On further questioning he admits to a 5 year history of quiesced. ulcerative colitis. He is not in any treatment for this. He's had no recent travel 12/11/16; the patient arrives today with his wound and roughly the same condition we've been using silver alginate this is a deep punched out wound with some surrounding erythema but no tenderness. Biopsy I did did not show confirmed pyoderma gangrenosum suggested nonspecific inflammation and vasculitis but does not provide an actual description of what was seen by the pathologist. I'm really not able to  understand this We have also received information from the patient's dermatologist in [place] notes from April 2016. This was a doctor Agarwal-antal. 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 He was prescribed topical steroid high potency under occlusion which helped but at this point the patient did not have a deep punched out wound. 12/18/16; the patient's wound is larger in terms of surface area however this surface looks better and there is less depth. The surrounding erythema also is better. The patient states that the wrap we put on came off 2 days ago when he has been using his compression stockings. He we are in the process of getting a dermatology consult. 12/26/16 on evaluation today patient's left lower extremity wound shows evidence of infection with surrounding erythema noted. He has been tolerating the dressing changes but states that he has noted more discomfort. There is a larger area  of erythema surrounding the wound. No fevers, chills, nausea, or vomiting noted at this time. With that being said the wound still does have slough covering the surface. He is not allergic to any medication that he is aware of at this point. In regard to his right lower extremity he had several regions that are erythematous and pruritic he wonders if there's anything we can do to help that. 01/02/17 I reviewed patient's wound culture which was obtained his visit last week. He was placed on doxycycline at that point. Unfortunately that does not appear to be an antibiotic that would likely help with the situation however the pseudomonas noted on culture is sensitive to Cipro. Also unfortunately patient's wound seems to have a large compared to last week's evaluation. Not severely so but there are definitely increased measurements in general. He is continuing to have discomfort as well he writes this to be a seven out of 10. In fact he would prefer me not to perform any debridement today due to the  fact that he is having discomfort and considering he has an active infection on the little reluctant to do so anyway. No fevers, chills, nausea, or vomiting noted at this time. 01/08/17; patient seems dermatology on September 5. I suspect dermatology will want the slides from the biopsy I did sent to their pathologist. I'm not sure if there is a way we can expedite that. In any case the culture I did before I left on vacation 3 weeks ago showed Pseudomonas he was given 10 days of Cipro and per her description of her intake nurses is actually somewhat better this week although the wound is quite a bit bigger than I remember the last time I saw this. He still has 3 more days of Cipro 01/21/17; dermatology appointment tomorrow. He has completed the ciprofloxacin for Pseudomonas. Surface of the wound looks better however he is had some deterioration in the lesions on his right leg. Meantime the left lateral leg wound we will continue with sample 01/29/17; patient had his dermatology appointment but I can't yet see that note. He is completed his antibiotics. The wound is more superficial but considerably larger in circumferential area than when he came in. This is in his left lateral calf. He also has swollen erythematous areas with superficial wounds on the right leg and small papular areas on both arms. There apparently areas in her his upper thighs and buttocks I did not look at those. Dermatology biopsied the right leg. Hopefully will have their input next week. 02/05/17; patient went back to see his dermatologist who told him that he had a "scratching problem" as well as staph. He is now on a 30 day course of doxycycline and I believe she gave him triamcinolone cream to the right leg areas to help with the itching [not exactly sure but probably triamcinolone]. She apparently looked at the left lateral leg wound although this was not rebiopsied and I think felt to be ultimately part of the same  pathogenesis. He is using sample border foam and changing nevus himself. He now has a new open area on the right posterior leg which was his biopsy site I don't have any of the dermatology notes 02/12/17; we put the patient in compression last week with SANTYL to the wound on the left leg and the biopsy. Edema is much better and the depth of the wound is now at level of skin. Area is still the same Biopsy site on the right lateral  leg we've also been using santyl with a border foam dressing and he is changing this himself. 02/19/17; Using silver alginate started last week to both the substantial left leg wound and the biopsy site on the right wound. He is tolerating compression well. Has a an appointment with his primary M.D. tomorrow wondering about diuretics although I'm wondering if the edema problem is actually lymphedema KASHTEN, GOWIN ([id number]) 02/26/17; the patient has been to see his primary doctor Dr. Jerrel Ivory at [facility] our primary care. She started him on Lasix 20 mg and this seems to have helped with the edema. However we are not making substantial change with the left lateral calf wound and inflammation. The biopsy site on the right leg also looks stable but not really all that different. 03/12/17; the patient has been to see vein and vascular Dr. Lucky Cowboy. He has had venous reflux studies I have not reviewed these. I did get a call from his dermatology office. 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 They therefore recommended clobetasol and tetracycline i.e. aggressive treatment for possible pyoderma gangrenosum. 03/26/17; apparently the patient just had reflux studies not an appointment with Dr. dew. She arrives in  clinic today having applied clobetasol for 2-3 weeks. He notes over the last 2-3 days excessive drainage having to change the dressing 3-4 times a day and also expanding erythema. 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 Lillia Carmel at [facility] dermatology locally who kindly saw him at my request. 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 My concern was the degree of erythema medially extending from his left leg wound which was either inflammation from pyoderma or cellulitis. I put him on Augmentin however culture of the wound showed Pseudomonas which is quinolone sensitive. I really don't believe he has cellulitis however in view of everything I will continue and give him a course of Cipro. He is also on doxycycline as an immune modulator for the pyoderma. In addition to his original wound on the left lateral leg with surrounding erythema he has a wound on the right posterior calf which was an original biopsy site done by dermatology. 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 Saw Dr. Lillia Carmel yesterday. He has been using topical antibiotics to both wound areas his original wound on the left and the biopsies/pathergy area on the right. There is definitely some improvement in the inflammation around the wound on the right although the patient states he has increasing sensitivity of the wounds. He is on prednisone 60 and doxycycline 1 as prescribed by Dr. Lillia Carmel. He is covering the topical antibiotic with gauze and putting this in his own compression stocks and changing this daily. He states that Dr. Lottie Rater did a culture of the left  leg wound yesterday 05/07/17; pyoderma gangrenosum. The patient saw Dr. Lillia Carmel yesterday and has a follow-up with her in one month. He is still using  topical antibiotics to both wounds although he can't recall exactly what type. He is still on prednisone 60 mg. Dr. Lillia Carmel stated that the doxycycline could stop if we were in agreement. 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 The right medial leg was induced by biopsy/pathergy. The area on the right is essentially healed. 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 The right medial leg has closed and remains closed. He is still on prednisone 60. He tells me he missed his last dermatology appointment with Dr. Lillia Carmel but will make another appointment. He reports that her blood sugar at a recent screen in [place] was high 200's. He was 180 today. 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 I think he is going to require still much longer prednisone perhaps another 3 months before attempting to taper. In the meantime his wound is a lot better. Smaller. He is cleaning this off daily and applying topical antibiotics. When he was last in the clinic I thought about changing to [facility] and actually put in a couple of calls to dermatology although probably not during their business hours. 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 He continues on oral prednisone. He has been using triple antibiotic ointment. There is surface debris and we will transition to [facility] and have him return in 2 weeks. He has lost 30 pounds since his last appointment with lifestyle modification. He may benefit from topical steroid cream for  treatment this can be considered at a later date. 08/22/17 on evaluation today patient appears to actually be doing rather well in regard to his left lateral lower extremity ulcer. He has actually been managed by Dr. Dellia Nims most recently. Patient is currently on oral steroids at this time. This seems to have been of benefit for him. Nonetheless his last visit was actually with Leah on 08/06/17. Currently he is not utilizing any topical steroid creams although this could be of benefit as well. No fevers, chills, nausea, or vomiting noted at this time. 09/05/17 on evaluation today patient appears to be doing better in regard to his left lateral lower extremity ulcer. He has been tolerating the dressing changes without complication. He is using Santyl with good effect. Overall I'm very pleased with how things are standing at this point. Patient likewise is happy that this is doing better. 09/19/17 on evaluation today patient actually appears to be doing rather well in regard to his left lateral lower extremity ulcer. 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 He's not having any significant pain. 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 He has been tolerating the Santyl without complication and in general I feel like he's making good progress. 10/17/17 on evaluation today patient appears to be doing very well in regard to his left lateral lower surety ulcer. He has been tolerating the dressing changes without complication. There does not appear to be any evidence of infection he's alternating the Santyl and the triple antibiotic ointment every other day this seems to  be doing well for him. 11/03/17 on evaluation today patient appears to be doing very well in regard to his left lateral lower extremity ulcer. He is been  tolerating the dressing changes without complication which is good news. Fortunately there does not appear to be any evidence of infection which is also great news. Overall is doing excellent they are starting to taper down on the prednisone is down to 40 mg at this point it also started topical clobetasol for him. 11/17/17 on evaluation today patient appears to be doing well in regard to his left lateral lower surety ulcer. He's been tolerating the dressing changes without complication. He does note that he is having no pain, no excessive drainage or discharge, and overall he feels like things are going about how he would expect and hope they would. Overall he seems to have no evidence of infection at this time in my opinion which is good news. 12/04/17-He is seen in follow-up evaluation for right lateral lower extremity ulcer. He has been applying topical steroid cream. Today's measurement show slight increase in size. Over the next 2 weeks we will transition to every other day Santyl and steroid cream. He has been encouraged to monitor for changes and notify clinic with any concerns 12/15/17 on evaluation today patient's left lateral motion the ulcer and fortunately is doing worse again at this point. This just since last week to this week has close to doubled in size according to the patient. I did not seeing last week's I do not have a visual to compare this to in our system was also down so we do not have all the charts and at this point. Nonetheless it does have me somewhat concerned in regard to the fact that again he was worried enough about it he has contact the dermatology that placed them back on the full strength, 50 mg a day of the prednisone that he was taken previous. He continues to alternate using clobetasol along with Santyl at this point. He is obviously somewhat frustrated. DAMEIR, GENTZLER ([id number]) 12/22/17 on evaluation today patient appears to be doing a little worse compared to  last evaluation. Unfortunately the wound is a little deeper and slightly larger than the last week's evaluation. With that being said he has made some progress in regard to the irritation surrounding at this time unfortunately despite that progress that's been made he still has a significant issue going on here. 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 For that reason I discussed with him today the possibility of perform a wound culture to ensure there's nothing overtly infected. 01/06/18 on evaluation today patient's wound is larger and deeper than previously evaluated. With that being said it did appear that his wound was infected after my last evaluation with him. Subsequently I did end up prescribing a prescription for Bactrim DS which she has been taking and having no complication with. Fortunately there does not appear to be any evidence of infection at this point in time as far as anything spreading, no want to touch, and overall I feel like things are showing signs of improvement. 01/13/18 on evaluation today patient appears to be even a little larger and deeper than last time. There still muscle exposed in the base of the wound. Nonetheless he does appear to be less erythematous I do believe inflammation is calming down also  believe the infection looks like it's probably resolved at this time based on what I'm seeing. No fevers, chills, nausea, or vomiting noted at this time. 01/30/18 on evaluation today patient actually appears to visually look better for the most part. Unfortunately those visually this looks better he does seem to potentially have what may be an abscess in the muscle that has been noted in the central portion of the wound. This is the first time that I have noted what appears to be fluctuance in the central portion of the  muscle. With that being said I'm somewhat more concerned about the fact that this might indicate an abscess formation at this location. I do believe that an ultrasound would be appropriate. This is likely something we need to try to do as soon as possible. He has been switch to mupirocin ointment and he is no longer using the steroid ointment as prescribed by dermatology he sees them again next week he's been decreased from 60 to 40 mg of prednisone. 03/09/18 on evaluation today patient actually appears to be doing a little better compared to last time I saw him. There's not as much erythema surrounding the wound itself. He I did review his most recent infectious disease note which was dated 02/24/18. He saw Dr. Michel Bickers in [place]. 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 Patient is now off of antibiotics and they are continually observing this. There seems to be no change in the past two weeks in my pinion based on what the patient says and what I see today compared to what Dr. Megan Salon likely saw two weeks ago. No fevers, chills, nausea, or vomiting noted at this time. 03/23/18 on evaluation today patient's wound actually appears to be showing signs of improvement which is good news. He is currently still on the Dapsone. He is also working on tapering the prednisone to get off of this and Dr. Lottie Rater is working with him in this regard. Nonetheless overall I feel like the wound is doing well it does appear based on the infectious disease note that I reviewed from Dr. 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 I did also review the results of his ultrasound of left lower extremity which revealed there was a dentist tissue in the base of the wound without an abscess noted. 04/06/18 on  evaluation today the patient's left lateral lower extremity ulcer actually appears to be doing fairly well which is excellent news. There does not appear to be any evidence of infection at this time which is also great news. Overall he still does have a significantly large ulceration although little by little he seems to be making progress. He is down to 10 mg a day of the prednisone. 04/20/18 on evaluation today patient actually appears to be doing excellent at this time in regard to his left lower extremity ulcer. He's making signs of good progress unfortunately this is taking much longer than we would really like to see but nonetheless he is making progress. Fortunately there does not appear to be any evidence of infection at this time. No fevers, chills, nausea, or vomiting noted at this time. The patient has not been using the Santyl due to the cost he hadn't got in this field yet. He's mainly been  using the antibiotic ointment topically. Subsequently he also tells me that he really has not been scrubbing in the shower I think this would be helpful again as I told him it doesn't have to be anything too aggressive to even make it believe just enough to keep it free of some of the loose slough and biofilm on the wound surface. 05/11/18 on evaluation today patient's wound appears to be making slow but sure progress in regard to the left lateral lower extremity ulcer. He is been tolerating the dressing changes without complication. Fortunately there does not appear to be any evidence of infection at this time. He is still just using triple antibiotic ointment along with clobetasol occasionally over the area. He never got the Santyl and really does not seem to intend to in my pinion. 06/01/18 on evaluation today patient appears to be doing a little better in regard to his left lateral lower extremity ulcer. He states that overall he does not feel like he is doing as well with the Dapsone as he did with  the prednisone. Nonetheless he sees his dermatologist later today and is gonna talk to them about the possibility of going back on the prednisone. Overall again I believe that the wound would be better if you would utilize Santyl but he really does not seem to be interested in going back to the [place] at this point. He has been using triple antibiotic ointment. 06/15/18 on evaluation today patient's wound actually appears to be doing about the same at this point. Fortunately there is no signs of infection at this time. He has made slight improvements although he continues to not really want to clean the wound bed at this point. He states that he just doesn't mess with it he doesn't want to cause any problems with everything else he has going on. He has been on medication, antibiotics as prescribed by his dermatologist, for a staff infection of his lower extremities which is really drying out now and looking much better he tells me. Fortunately there is no sign of overall infection. 06/29/18 on evaluation today patient appears to be doing well in regard to his left lateral lower surety ulcer all things considering. Fortunately his staff infection seems to be greatly improved compared to previous. He has no signs of infection and this is drying up quite nicely. He is still the doxycycline for this is no longer on cental, Dapsone, or any of the other medications. His dermatologist has recommended possibility of an infusion but right now he does not want to proceed with that. 07/13/18 on evaluation today patient appears to be doing about the same in regard to his left lateral lower surety ulcer. Fortunately there's no signs of infection at this time which is great news. Unfortunately he still builds up a significant amount of Slough/biofilm of the surface of the wound he still is not really cleaning this as he should be appropriately. Again I'm able to easily with saline and gauze remove the majority of  this on the surface which if you would do this at home would likely be a dramatic improvement for him as far as getting the area to improve. Nonetheless overall I still feel like he is making progress is just very slow. I think Santyl will be of benefit for him as well. Still he has not gotten this as of this point. 07/27/18 on evaluation today patient actually appears to be doing little worse in regards of the erythema around the periwound  region of the wound he also tells me that he's been having more drainage currently compared to what he was experiencing last time I saw him. He states not quite as bad as what he had because this was infected previously but nonetheless is still appears to be doing poorly. Fortunately there is no evidence of systemic infection at this point. The patient tells me that he is not going to be able to afford the Santyl. He is still waiting to hear about the infusion therapy with his dermatologist. Apparently she wants an updated colonoscopy first. DOMINYK, LAW ([id number]) 08/10/18 on evaluation today patient appears to be doing better in regard to his left lateral lower extremity ulcer. Fortunately he is showing signs of improvement in this regard he's actually been approved for Remicade infusion's as well although this has not been scheduled as of yet. Fortunately there's no signs of active infection at this time in regard to the wound although he is having some issues with infection of the right lower extremity is been seen as dermatologist for this. Fortunately they are definitely still working with him trying to keep things under control. 09/07/18 on evaluation today patient is actually doing rather well in regard to his left lateral lower extremity ulcer. He notes these actually having some hair grow back on his extremity which is something he has not seen in years. He also tells me that the pain is really not giving them any trouble at this time which is also good  news overall she is very pleased with the progress he's using a combination of the mupirocin along with the probate is all mixed. 09/21/18 on evaluation today patient actually appears to be doing fairly well all things considered in regard to his looks from the ulcer. He's been tolerating the dressing changes without complication. Fortunately there's no signs of active infection at this time which is good news he is still on all antibiotics or prevention of the staff infection. He has been on prednisone for time although he states it is gonna contact his dermatologist and see if she put them on a short course due to some irritation that he has going on currently. Fortunately there's no evidence of any overall worsening this is going very slow I think cental would be something that would be helpful for him although he states that $50 for tube is quite expensive. He therefore is not willing to get that at this point. 10/06/18 on evaluation today patient actually appears to be doing decently well in regard to his left lateral leg ulcer. He's been tolerating the dressing changes without complication. Fortunately there's no signs of active infection at this time. Overall I'm actually rather pleased with the progress he's making although it's slow he doesn't show any signs of infection and he does seem to be making some improvement. I do believe that he may need a switch up and dressings to try to help this to heal more appropriately and quickly. 10/19/18 on evaluation today patient actually appears to be doing better in regard to his left lateral lower extremity ulcer. This is shown signs of having much less Slough buildup at this point due to the fact he has been using the [employer]. Obviously this is very good news. The overall size of the wound is not dramatically smaller but again the appearance is. 11/02/18 on evaluation today patient actually appears to be doing quite well in regard to his lower [place]  ulcer. A lot of the skin around  the ulcer is actually somewhat irritating at this point this seems to be more due to the dressing causing irritation from the adhesive that anything else. Fortunately there is no signs of active infection at this time. 11/24/18 on evaluation today patient appears to be doing a little worse in regard to his overall appearance of his lower extremity ulcer. There's more erythema and warmth around the wound unfortunately. He is currently on doxycycline which he has been on for some time. With that being said I'm not sure that seems to be helping with what appears to possibly be an acute cellulitis with regard to his left lower extremity ulcer. No fevers, chills, nausea, or vomiting noted at this time. 12/08/18 on evaluation today patient's wounds actually appears to be doing significantly better compared to his last evaluation. He has been using Santyl along with alternating tripling about appointment as well as the steroid cream seems to be doing quite well and the wound is showing signs of improvement which is excellent news. Fortunately there's no evidence of infection and in fact his culture came back negative with only normal skin flora noted. 12/21/2018 upon evaluation today patient actually appears to be doing excellent with regard to his ulcer. This is actually the best that I have seen it since have been helping to take care of him. It is both smaller as well as less slough noted on the surface of the wound and seems to be showing signs of good improvement with new skin growing from the edges. He has been using just the triamcinolone he does wonder if he can get a refill of that ointment today. 01/04/2019 upon evaluation today patient actually appears to be doing well with regard to his left lateral lower extremity ulcer. With that being said it does not appear to be that he is doing quite as well as last time as far as progression is concerned. There does not appear to  be any signs of infection or significant irritation which is good news. With that being said I do believe that he may benefit from switching to a collagen based dressing based on how clean The wound appears. 01/18/2019 on evaluation today patient actually appears to be doing well with regard to his wound on the left lower extremity. He is not made a lot of progress compared to where we were previous but nonetheless does seem to be doing okay at this time which is good news. There is no signs of active infection which is also good news. My only concern currently is I do wish we can get him into utilizing the collagen dressing his insurance would not pay for the supplies that we ordered although it appears that he may be able to order this through his supply company that he typically utilizes. This is [employer]. Nonetheless he did try to order it during the office visit today and it appears this did go through. We will see if he can get that it is a different brand but nonetheless he has collagen and I do think will be beneficial. 02/01/2019 on evaluation today patient actually appears to be doing a little worse today in regard to the overall size of his wounds. Fortunately there is no signs of active infection at this time. That is visually. Nonetheless when this is happened before it was due to infection. For that reason were somewhat concerned about that this time as well. 02/08/2019 on evaluation today patient unfortunately appears to be doing slightly worse with regard  to his wound upon evaluation today. Is measuring a little deeper and a little larger unfortunately. I am not really sure exactly what is causing this to enlarge he actually did see his dermatologist she is going to see about initiating Humira for him. Subsequently she also did do steroid injections into the wound itself in the periphery. Nonetheless still nonetheless he seems to be getting a little bit larger he is gone back to just  using the steroid cream topically which I think is appropriate. I would say hold off on the collagen for the time being is definitely a good thing to do. Based on the culture results which we finally did get the final result back regarding it shows staph as the bacteria noted again that can be a normal skin bacteria based on the fact however he is having increased drainage and worsening of the wound measurement wise I would go ahead and place him on an antibiotic today I do believe for this. 02/15/2019 on evaluation today patient actually appears to be doing somewhat better in regard to his ulcer. There is no signs of worsening at this time I did review his culture results which showed evidence of Staphylococcus aureus but not MRSA. Again this could just be more related to the normal skin bacteria although he states the drainage has slowed down quite a bit he may have had a mild infection not just colonization. And was much smaller and then since around10/04/2019 on evaluation today patient appears to be doing unfortunately worse as far as the size of the wound. I really feel like that this is steadily getting larger again it had been doing excellent right at the beginning of September we have seen a steady increase in the area of the wound it is almost 2-1/2 times the size it was on September 1. Obviously this is a bad trend this is not wanting to see. For that reason we went back to using just the topical triamcinolone cream which does seem to help with inflammation. I checked him for bacteria by way of culture and nothing showed positive there. I am considering giving him a short course of a tapering steroid Teryn Gust, Aster ([id number]) today to see if that is can be beneficial for him. The patient is in agreement with giving that a try. 03/08/2019 on evaluation today patient appears to be doing very well in comparison to last evaluation with regard to his lower extremity ulcer. This is  showing signs of less inflammation and actually measuring slightly smaller compared to last time every other week over the past month and a half he has been measuring larger larger larger. Nonetheless I do believe that the issue has been inflammation the prednisone does seem to have been beneficial for him which is good news. No fevers, chills, nausea, vomiting, or diarrhea. 03/22/2019 on evaluation today patient appears to be doing about the same with regard to his leg ulcer. He has been tolerating the dressing changes without complication. With that being said the wound seems to be mostly arrested at its current size but really is not making any progress except for when we prescribed the prednisone. He did show some signs of dropping as far as the overall size of the wound during that interval week. Nonetheless this is something he is not on long-term at this point and unfortunately I think he is getting need either this or else the Humira which his dermatologist has discussed try to get approval for. With that  being said he will be seeing his dermatologist on the 11th of this month that is November. 04/19/2019 on evaluation today patient appears to be doing really about the same the wound is measuring slightly larger compared to last time I saw him. He has not been into the office since November 2 due to the fact that he unfortunately had Covid as that his entire family. He tells me that it was rough but they did pull-through and he seems to be doing much better. Fortunately there is no signs of active infection at this time. No fevers, chills, nausea, vomiting, or diarrhea. 05/10/2019 on evaluation today patient unfortunately appears to be doing significantly worse as compared to last time I saw him. He does tell me that he has had his first dose of Humira and actually is scheduled to get the next one in the upcoming week. With that being said he tells me also that in the past several days he has  been having a lot of issues with green drainage she showed me a picture this is more blue-green in color. He is also been having issues with increased sloughy buildup and the wound does appear to be larger today. Obviously this is not the direction that we want everything to take based on the starting of his Humira. Nonetheless I think this is definitely a result of likely infection and to be honest I think this is probably Pseudomonas causing the infection based on what I am seeing. 05/24/2019 on evaluation today patient unfortunately appears to be doing significantly worse compared to his prior evaluation with me 2 weeks ago. I did review his culture results which showed that he does have Staph aureus as well as Pseudomonas noted on the culture. Nonetheless the Levaquin that I prescribed for him does not appear to have been appropriate and in fact he tells me he is no longer experiencing the green drainage and discharge that he had at the last visit. Fortunately there is no signs of active infection at this time which is good news although the wound has significantly worsened it in fact is much deeper than it was previous. We have been utilizing up to this point triamcinolone ointment as the prescription topical of choice but at this time I really feel like that the wound is getting need to be packed in order to appropriately manage this due to the deeper nature of the wound. Therefore something along the lines of an alginate dressing may be more appropriate. 05/31/2019 upon inspection today patient's wound actually showed signs of doing poorly at this point. Unfortunately he just does not seem to be making any good progress despite what we have tried. He actually did go ahead and pick up the Cipro and start taking that as he was noticing more green drainage he had previously completed the Levaquin that I prescribed for him as well. Nonetheless he missed his appointment for the seventh last week on  Wednesday with the wound care center and [facility] where his dermatologist referred him. Obviously I do think a second opinion would be helpful at this point especially in light of the fact that the patient seems to be doing so poorly despite the fact that we have tried everything that I really know how at this point. The only thing that ever seems to have helped him in the past is when he was on high doses of continual steroids that did seem to make a difference for him. 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 06/07/2019 upon evaluation today patient unfortunately appears to be doing worse yet again with regard to his wound. In fact I am starting to question whether or not he may have a fluid pocket in the muscle at this point based on the bulging and the soft appearance to the central portion of the muscle area. There is not anything draining from the muscle itself at this time which is good news but nonetheless the wound is expanding. I am not really seeing any results of the Humira as far as overall wound progression based on what I am seeing at this point. The patient has been referred for second opinion with regard to his wound to the [facility] wound care center by his dermatologist which I definitely am not in opposition to. Unfortunately we tried multiple dressings in the past including collagen, alginate, and at one point even Hydrofera Blue. With that being said he is never really used it for any significant amount of time due to the fact that he often complains of pain associated with these dressings and then will go back to either using the Santyl which she has done intermittently or more frequently the triamcinolone. He is also using his own compression stockings. We have wrapped him in the past but again that was something else  that he really was not a big fan of. 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 He seemed to do much better when he was on the steroids although honestly I understand that the steroids are not necessarily the best medication to be on long-term obviously 06/14/2019 on evaluation today patient appears to be doing actually a little bit better with regard to the overall appearance with his leg. Unfortunately he does continue to have issues with what appears to be some fluid underneath the muscle although he did see the wound specialty center at [facility] last week their main goals were to see about infusion therapy in place of the Humira as they feel like that is not quite strong enough. They also recommended that we continue with the treatment otherwise as we are they felt like that was appropriate and they are okay with him continuing to follow-up here with [place] in that regard. With that being said they are also sending him to the vein specialist there to see about vein stripping and if that would be of benefit for him. Subsequently they also did not really address whether or not an ultrasound of the muscle area to see if there is anything that needs to be addressed here would be appropriate or not. For that reason I discussed this with him last week I think we may proceed down that road at this point. 06/21/2019 upon evaluation today patient's wound actually appears to be doing slightly better compared to previous evaluations. I do believe that he has made a difference with regard to the progression here with the use of oral steroids. Again in the  past has been the only thing that is  really calm things down. He does tell me that from [facility] is gotten a good news from there that there are no further vein stripping that is necessary at this point. I do not have that available for review today although the patient did relay this to me. He also did obtain and have the ultrasound of the wound completed which I did sign off on today. It does appear that there is no fluid collection under the muscle this is likely then just edematous tissue in general. That is also good news. Overall I still believe the inflammation is the main issue here. He did inquire about the possibility of a wound VAC again with the muscle protruding like it is I am not really sure whether the wound VAC is necessarily ideal or not. That is something we will have to consider although I do believe he may need compression wrapping to try to help with edema control which could potentially be of benefit. 06/28/2019 on evaluation today patient appears to be doing slightly better measurement wise although this is not terribly smaller he least seems to be trending towards that direction. With that being said he still seems to have purulent drainage noted in the wound bed at this time. He has been on Levaquin followed by Cipro over the past month. Unfortunately he still seems to have some issues with active infection at this time. I did perform a culture last week in order to evaluate and see if indeed there was still anything going on. Subsequently the culture did come back Cotrell, Keevin ([id number]) showing Pseudomonas which is consistent with the drainage has been having which is blue-green in color. He also has had an odor that again was somewhat consistent with Pseudomonas as well. Long story short it appears that the culture showed an intermediate finding with regard to how well the Cipro will work for the Pseudomonas infection. Subsequently being that he does not seem to be clearing up and at best what we are doing is  just keeping this at [place] I think he may need to see infectious disease to discuss IV antibiotic options. 07/05/2019 upon evaluation today patient appears to be doing okay in regard to his leg ulcer. He has been tolerating the dressing changes at this point without complication. Fortunately there is no signs of active infection at this time which is good news. No fevers, chills, nausea, vomiting, or diarrhea. With that being said he does have an appointment with infectious disease tomorrow and his primary care on Wednesday. Again the reason for the infectious disease referral was due to the fact that he did not seem to be fully resolving with the use of oral antibiotics and therefore we were thinking that IV antibiotic therapy may be necessary secondary to the fact that there was an intermediate finding for how effective the Cipro may be. Nonetheless again he has been having a lot of purulent and even green drainage. Fortunately right now that seems to have calmed down over the past week with the reinitiation of the oral antibiotic. Nonetheless we will see what Dr. Megan Salon has to say. 07/12/2019 upon evaluation today patient appears to be doing about the same at this point in regard to his left lower extremity ulcer. Fortunately there is no signs of active infection at this time which is good news I do believe the Levaquin has been beneficial I did review Dr. Hale Bogus note and to be honest I  agree that the patient's leg does appear to be doing better currently. What we found in the past as he does not seem to really completely resolve he will stop the antibiotic and then subsequently things will revert back to having issues with blue-green drainage, increased pain, and overall worsening in general. Obviously that is the reason I sent him back to infectious disease. 07/19/2019 upon evaluation today patient appears to be doing roughly the same in size there is really no dramatic improvement. He has  started back on the Levaquin at this point and though he seems to be doing okay he did still have a lot of blue/green drainage noted on evaluation today unfortunately. I think that this is still indicative more likely of a Pseudomonas infection as previously noted and again he does see Dr. Megan Salon in just a couple of days. I do not know that were really able to effectively clear this with just oral antibiotics alone based on what I am seeing currently. Nonetheless we are still continue to try to manage as best we can with regard to the patient and his wound. I do think the wrap was helpful in decreasing the edema which is excellent news. No fevers, chills, nausea, vomiting, or diarrhea. 07/26/2019 upon evaluation today patient appears to be doing slightly better with regard to the overall appearance of the muscle there is no dark discoloration centrally. Fortunately there is no signs of active infection at this time. No fevers, chills, nausea, vomiting, or diarrhea. Patient's wound bed currently the patient did have an appointment with Dr. Megan Salon at infectious disease last week. With that being said Dr. Megan Salon the patient states was still somewhat hesitant about put him on any IV antibiotics he wanted [place] to repeat cultures today and then see where things go going forward. He does look like Dr. Megan Salon because of some improvement the patient did have with the Levaquin wanted [place] to see about repeating cultures. If it indeed grows the Pseudomonas again then he recommended a possibility of considering a PICC line placement and IV antibiotic therapy. He plans to see the patient back in 1 to 2 weeks. 08/02/2019 upon evaluation today patient appears to be doing poorly with regard to his left lower extremity. We did get the results of his culture back it shows that he is still showing evidence of Pseudomonas which is consistent with the purulent/blue-green drainage that he has currently. Subsequently the  culture also shows that he now is showing resistance to the oral fluoroquinolones which is unfortunate as that was really the only thing to treat the infection prior. I do believe that he is looking like this is going require IV antibiotic therapy to get this under control. Fortunately there is no signs of systemic infection at this time which is good news. The patient does see Dr. Megan Salon tomorrow. 08/09/2019 upon evaluation today patient appears to be doing better with regard to his left lower extremity ulcer in regard to the overall appearance. He is currently on IV antibiotic therapy. As ordered by Dr. Megan Salon. Currently the patient is on ceftazidime which she is going to take for the next 2 weeks and then follow-up for 4 to 5-week appointment with Dr. Megan Salon. The patient started this this past Friday symptoms have not for a total of 3 days currently in full. 08/16/2019 upon evaluation today patient's wound actually does show muscle in the base of the wound but in general does appear to be much better as far as the  overall evidence of infection is concerned. In fact I feel like this is for the most part cleared up he still on the IV antibiotics he has not completed the full course yet but I think he is doing much better which is excellent news. 08/23/2019 upon evaluation today patient appears to be doing about the same with regard to his wound at this point. He tells me that he still has pain unfortunately. Fortunately there is no evidence of systemic infection at this time which is great news. There is significant muscle protrusion. 09/13/19 upon evaluation today patient appears to be doing about the same in regard to his leg unfortunately. He still has a lot of drainage coming from the ulceration there is still muscle exposed. With that being said the patient's last wound culture still showed an intermediate finding with regard to the Pseudomonas he still having the bluish/green drainage as well.  Overall I do not know that the wound has completely cleared of infection at this point. Fortunately there is no signs of active infection systemically at this point which is good news. 09/20/2019 upon evaluation today patient's wound actually appears to be doing about the same based on what I am seeing currently. I do not see any signs of systemic infection he still does have evidence of some local infection and drainage. He did see Dr. Megan Salon last week and Dr. Megan Salon states that he probably does need a different IV antibiotic although he does not want to put him on this until the patient begins the Remicade infusion which is actually scheduled for about 10 days out from today on 13 May. Following that time Dr. Megan Salon is good to see him back and then will evaluate the feasibility of starting him on the IV antibiotic therapy once again at that point. I do not disagree with this plan I do believe as Dr. Megan Salon stated in his note that I reviewed today that the patient's issue is multifactorial with the pyoderma being 1 aspect of this that were hoping the Remicade will be helpful for her. In the meantime I think the gentamicin is, helping to keep things under decent okay control in regard to the ulcer. 09/27/2019 upon evaluation today patient appears to be doing about the same with regard to his wound still there is a lot of muscle exposure though he does have some hyper granulation tissue noted around the edge and actually some granulation tissue starting to form over the muscle which is actually good news. Fortunately there is no evidence of active infection which is also good news. His pain is less at this point. 5/21; this is a patient I have not seen in a long time. 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 He has a large wound on the left lateral leg with protruding muscle. He comes in the clinic today showing the same area on his left medial ankle. He says  there is been a spot there for some time although we have not previously defined this. Today he has a clearly defined area with slight amount of skin breakdown surrounded by raised areas with a purplish hue in color. This is not painful he says it is irritated. 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 He still has muscle protruding from the lateral portion of his left leg but fortunately the new area that they were concerned about at his last  visit does not appear to have opened at this point. He is currently on Remicade infusions and seems to be doing better in my opinion in fact the wound itself seems to be overall much better. The purplish discoloration that he did have seems to have resolved and I think that is a good sign that hopefully the Remicade is doing its job. He does Canizalez, Felton ([id number]) have some biofilm noted over the surface of the wound. 11/01/2019 on evaluation today patient's wound actually appears to be doing excellent at this time. Fortunately there is no evidence of active infection and overall I feel like he is making great progress. The Remicade seems to be due excellent job in my opinion. 11/08/19 evaluation today vision actually appears to be doing quite well with regard to his weight ulcer. He's been tolerating dressing changes without complication. Fortunately there is no evidence of infection. No fevers, chills, nausea, or vomiting noted at this time. Overall states that is having more itching than pain which is actually a good sign in my opinion. 12/13/2019 upon evaluation today patient appears to be doing well today with regard to his wound. He has been tolerating the dressing changes without complication. Fortunately there is no sign of active infection at this time. No fevers, chills, nausea, vomiting, or diarrhea. Overall I feel like the infusion therapy  has been very beneficial for him. 01/06/2020 on evaluation today patient appears to be doing well with regard to his wound. This is measuring smaller and actually looks to be doing better. Fortunately there is no signs of active infection at this point. No fevers, chills, nausea, vomiting, or diarrhea. With that being said he does still have the blue-green drainage but this does not seem to be causing any significant issues currently. He has been using the gentamicin that does seem to be keeping things under decent control at this point. He goes later this morning for his next infusion therapy for the pyoderma which seems to also be very beneficial. 02/07/2020 on evaluation today patient appears to be doing about the same in regard to his wounds currently. Fortunately there is no signs of active infection systemically he does still have evidence of local infection still using gentamicin. He also is showing some signs of improvement albeit slowly I do feel like we are making some progress here. 02/21/2020 upon evaluation today patient appears to be making some signs of improvement the wound is measuring a little bit smaller which is great news and overall I am very pleased with where he stands currently. He is going to be having infusion therapy treatment on the 15th of this month. Fortunately there is no signs of active infection at this time. 03/13/2020 I do believe patient's wound is actually showing some signs of improvement here which is great news. He has continue with the infusion therapy through rheumatology/dermatology at [facility]. That does seem to be beneficial. I still think he gets as much benefit from this as he did from the prednisone initially but nonetheless obviously this is less harsh on his body that the prednisone as far as they are concerned. 03/31/2020 on evaluation today patient's wound actually showing signs of some pretty good improvement in regard to the overall appearance of the  wound bed. There is still muscle exposed though he does have some epithelial growth around the edges of the wound. Fortunately there is no signs of active infection at this time. No fevers, chills, nausea, vomiting, or diarrhea. 04/24/2020 upon evaluation today  patient appears to be doing about the same in regard to his leg ulcer. He has been tolerating the dressing changes without complication. Fortunately there is no signs of active infection at this time. No fevers, chills, nausea, vomiting, or diarrhea. With that being said he still has a lot of irritation from the bandaging around the edges of the wound. We did discuss today the possibility of a referral to plastic surgery. 05/22/2020 on evaluation today patient appears to be doing well with regard to his wounds all things considered. He has not been able to get the Chantix apparently there is a recall nurse that I was unaware of put out by [employer] involuntarily. Nonetheless for now I am and I have to do some research into what may be the best option for him to help with quitting in regard to smoking and we discussed that today. 06/26/2020 upon evaluation today patient appears to be doing well with regard to his wound from the standpoint of infection I do not see any signs of infection at this point. With that being said unfortunately he is still continuing to have issues with muscle exposure and again he is not having a whole lot of new skin growth unfortunately. There does not appear to be any signs of active infection at this time. No fevers, chills, nausea, vomiting, or diarrhea. 07/10/2020 upon evaluation today patient appears to be doing a little bit more poorly currently compared to where he was previous. I am concerned currently about an active infection that may be getting worse especially in light of the increased size and tenderness of the wound bed. No fevers, chills, nausea, vomiting, or diarrhea. 07/24/2020 upon evaluation today patient  appears to be doing poorly in regard to his leg ulcer. He has been tolerating the dressing changes without complication but unfortunately is having a lot of discomfort. 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 Subsequently I think he is going require possibly IV antibiotics to get this under control. I am very concerned about the severity of his infection and the amount of discomfort he is having. 07/31/2020 upon evaluation today patient appears to be doing about the same in regard to his leg wound. He did see Dr. Megan Salon and Dr. Megan Salon is actually going to start him on IV antibiotics. He goes for the PICC line tomorrow. With that being said there do not have that run for 2 weeks and then see how things are doing and depending on how he is progressing they may extend that a little longer. Nonetheless I am glad this is getting ready to be in place and definitely feel it may help the patient. In the meantime is been using mainly triamcinolone to the wound bed has an anti-inflammatory. 08/07/2020 on evaluation today patient appears to be doing well with regard to his wound compared even last week. In the interim he has gotten the PICC line placed and overall this seems to be doing excellent. There does not appear to be any evidence of infection which is great news systemically although locally of course has had the infection this appears to be improving with the use of the antibiotics. 08/14/2020 upon evaluation today patient's wound actually showing signs of excellent improvement. Overall the irritation has significantly improved the drainage is back down to more of a normal level and his pain is really pretty much nonexistent compared to what it was. Obviously I think that this is significantly improved secondary  to the IV antibiotic therapy which has made all the difference in the world. Again he had a resistant form of  Pseudomonas for which oral antibiotics just was not cutting it. Nonetheless I do think that still we need to consider the possibility of a surgical closure for this wound is been open so long and to be honest with muscle exposed I think this can be very hard to get this to close outside of this although definitely were still working to try to do what we can in that regard. 08/21/2020 upon evaluation today patient appears to be doing very well with regard to his wounds on the left lateral lower extremity/calf area. Fortunately there does not appear to be signs of active infection which is great news and overall very pleased with where things stand today. He is actually wrapping up his treatment with IV antibiotics tomorrow. After that we will see where things go from there. 08/28/2020 upon evaluation today patient appears to be doing decently well with regard to his leg ulcer. There does not appear to be any signs of Caulfield, Donn ([id number]) active infection which is great news and overall very pleased with where things stand today. No fevers, chills, nausea, vomiting, or diarrhea. 09/18/2020 upon evaluation today patient appears to be doing well with regard to his infection which I feel like is better. Unfortunately he is not doing as well with regard to the overall size of the wound which is not nearly as good at this point. I feel like that he may be having an issue here with the pyoderma being somewhat out of control. I think that he may benefit from potentially going back and talking to the dermatologist about what to do from the pyoderma standpoint. I am not certain if the infusions are helping nearly as much is what the prednisone did in the past. 10/02/2020 upon evaluation today patient appears to be doing well with regard to his leg ulcer. He did go to the Psychiatric nurse. Unfortunately they feel like there is a 10% chance that most that he would be able to heal and that the skin graft would take.  Obviously this has led him to not be able to go down that path as far as treatment is concerned. Nonetheless he does seem to be doing a little bit better with the prednisone that I gave him last time. I think that he may need to discuss with dermatology the possibility of long-term prednisone as that seems to be what is most helpful for him to be perfectly honest. I am not sure the Remicade is really doing the job. 10/17/2020 upon evaluation today patient appears to be doing a little better in regard to his wound. In fact the case has been since we did the prednisone on May 2 for him that we have noticed a little bit of improvement each time we have seen a size wise as well as appearance wise as well as pain wise. I think the prednisone has had a greater effect then the infusion therapy has to be perfectly honest. With that being said the patient also feels significantly better compared to what he was previous. All of this is good news but nonetheless I am still concerned about the fact that again we are really not set up to long-term manage him as far as prednisone is concerned. Obviously there are things that you need to be watched I completely understand the risk of prednisone usage as well. That is why  has been doing the infusion therapy to try and control some of the pyoderma. With all that being said I do believe that we can give him another round of the prednisone which she is requesting today because of the improvement that he seen since we did that first round. 10/30/2020 upon evaluation today patient's wound actually is showing signs of doing quite well. There does not appear to be any evidence of infection which is great news and overall very pleased with where things stand today. No fevers, chills, nausea, vomiting, or diarrhea. He tells me that the prednisone still has seem to have helped he wonders if we can extend that for just a little bit longer. He did not have the appointment with a  dermatologist although he did have an infusion appointment last Friday. That was at [facility]. With that being said he tells me he could not do both that as well as the appointment with the physician on the same day therefore that is can have to be rescheduled. I really want to see if there is anything they feel like that could be done differently to try to help this out as I am not really certain that the infusions are helping significantly here. 11/13/2020 upon evaluation today patient unfortunately appears to be doing somewhat poorly in regard to his wound I feel like this is actually worsening from the standpoint of the pyoderma spreading. I still feel like that he may need something different as far as trying to manage this going forward. Again we did the prednisone unfortunately his blood sugars are not doing so well because of this. Nonetheless I believe that the patient likely needs to try topical steroid. We have done triamcinolone for a while I think going with something stronger such as clobetasol could be beneficial again this is not something I do lightly I discussed this with the patient that again this does not normally put underneath an occlusive dressing. Nonetheless I think a thin film as such could help with some of the stronger anti-inflammatory effects. We discussed this today. He would like to try to give this a trial for the next couple weeks. I definitely think that is something that we can do. Evaluate7/03/2021 and today patient's wound bed actually showed signs of doing really about the same. There was a little expansion of the size of the wound and that leading edge that we done looking out although the clobetasol does seem to have slowed this down a bit in my opinion. There is just 1 small area that still seems to be progressing based on what I see. Nonetheless I am concerned about the fact this does not seem to be improving if anything seems to be doing a little bit worse. I do  not know that the infusions are really helping him much as next infusion is August 5 his appointment with dermatology is July 25. Either way I really think that we need to have a conversation potentially about this and I am actually going to see if I can talk with Dr. Lillia Carmel in order to see where things stand as well. 12/11/2020 upon evaluation today patient appears to be doing worse in regard to his leg ulcer. Unfortunately I just do not think this is making the progress that I would like to see at this point. Honestly he does have an appointment with dermatology and this is in 2 days. I am wondering what they may have to offer to help with this. Right now  what I am seeing is that he is continuing to show signs of worsening little by little. Obviously that is not great at all. Is the exact opposite of what we are looking for. 12/18/2020 upon evaluation today patient appears to be doing a little better in regard to his wound. The dermatologist actually did do some steroid injections into the wound which does seem to have been beneficial in my opinion. That was on the 25th already this looks a little better to me than last time I saw him. With that being said we did do a culture and this did show that he has Staph aureus noted in abundance in the wound. With that being said I do think that getting him on an oral antibiotic would be appropriate as well. Also think we can compression wrap and this will make a difference as well. 12/28/2020 upon evaluation today patient's wound is actually showing signs of doing much better. I do believe the compression wrap is helping he has a lot of drainage but to be honest I think that the compression is helping to some degree in this regard as well as not draining through which is also good news. No fevers, chills, nausea, vomiting, or diarrhea. 01/04/2021 upon evaluation today patient appears to be doing well with regard to his wound. Overall things seem to be doing  quite well. He did have a little bit of reaction to the CarboFlex Sorbact he will be using that any longer. With that being said he is controlled as far as the drainage is concerned overall and seems to be doing quite well. I do not see any signs of active infection at this time which is great news. No fevers, chills, nausea, vomiting, or diarrhea. 01/11/2021 upon evaluation today patient appears to be doing well with regard to his wounds. He has been tolerating the dressing changes without complication. Fortunately there does not appear to be any signs of active infection at this time which is great news. Overall I am extremely pleased with where we stand currently. No fevers, chills, nausea, vomiting, or diarrhea. Where using clobetasol in the wound bed he has a lot of new skin growth which is awesome as well. 01/18/2021 upon evaluation today patient appears to be doing very well in regard to his leg ulcer. He has been tolerating the dressing changes without complication. Fortunately there does not appear to be any signs of active infection which is great news. In general I think that he is making excellent progress 01/25/2021 upon evaluation today patient appears to be doing well with regard to his wound on the leg. I am actually extremely pleased with where things stand today. There does not appear to be any signs of active infection which is great news and overall I think that we are definitely headed in the appropriate direction based on what I am seeing currently. There does not appear to be any signs of active infection also excellent news. 02/06/2021 upon evaluation today patient appears to be doing well with regard to his wound. Overall visually this is showing signs of significant Crull, Fitzgerald ([id number]) improvement which is great news. I do not see any signs of active infection systemically which is great even locally I do not think that we are seeing any major complications here. We did do  fluorescence imaging with the [employer] DX today. The patient does have some odor and drainage noted and again this is something that I think would benefit him to probably come  more frequently for nurse visits. 02/19/2021 upon evaluation today patient actually appears to be doing quite well in regard to his wound. He has been tolerating the dressing changes without complication and overall I think that this is making excellent progress. I do not see any evidence of active infection at this point which is great news as well. No fevers, chills, nausea, vomiting, or diarrhea. 10/10; wound is made nice progress healthy granulation with a nice rim of epithelialization which seems to be expanding even from last week he has a deeper area in the inferior part of the more distal part of the wound with not quite as healthy as surface. This area will need to be followed. Using clobetasol and Hydrofera Blue 03/05/2021 upon evaluation today patient appears to be doing very well in regard to his leg ulcer. He has been tolerating dressing changes without complication. Fortunately there does not appear to be any signs of active infection which is great news and overall I am extremely pleased with where we stand currently. 03/12/2021 upon evaluation today patient appears to be doing well with regard to his wound in fact this is extremely extremely good based on what we are seeing today there does not appear to be any signs of active infection and overall I think that he is doing awesome from the standpoint of healing in general. I am extremely pleased with how things seem to be progressing with regard to this pyoderma. Clobetasol has done wonders for him. I think the compression wrapping has also been of great benefit. 03/19/2021 upon evaluation today patient appears to be doing well with regard to his wound. He is tolerating the dressing changes without complication. In fact I feel like that he is actually making  excellent progress at this point based on what I am seeing. No fevers, chills, nausea, vomiting, or diarrhea. 03/26/2021 upon evaluation today patient appears to be doing well with regard to his wound. This again is measuring smaller and looking better. Again the progress is slow but nonetheless continual with what we have been seeing. I do believe that the current plan is doing awesome for him. 04/09/2021 upon evaluation today patient appears to be doing well with regard to his leg ulcer. This is showing signs of excellent improvement the muscle is completely closed over and there does not appear to be any evidence of inflammation at this point his drainage is significantly improved. Overall I think that he would be a good candidate for looking into a skin substitute at this point as well. We will get a look into some approvals in that regard. Potentially TheraSkin as well as Apligraf could both be considered just depending on insurance coverage. 04/16/2021 upon evaluation today patient appears to be doing well at this point. He has been approved for the Apligraf which we could definitely order although I would like to try to get the TheraSkin approved if at all possible. I did fax notes into them today and I Georgina Peer try to give a call as well. Overall the wound appears to be doing decently well today. 04/20/2021 upon evaluation today patient actually appears to be doing quite well in regard to his wound with that being said we are trying to see what we can do about speeding up the healing process. For that reason we did discuss the possibility of a skin substitute. We got the Apligraf approved. For that reason we will get a go ahead and see what we can do with the Apligraf  at this point. I am still trying to get the TheraSkin approved but I have not heard anything from the insurance company yet I have called and talked to them earlier in the week and to be honest this was about half an hour that I spent  on the phone they told me I would hear something within 10-15 business days. 04/27/2021 upon evaluation today patient appears to be doing excellent in regard to his wound. I do believe the Apligraf has been beneficial. With that being said he has had a little bit of increased pain has been a little bit concerned. I do want to go ahead and apply his steroid cream today the clobetasol and then put the Apligraf over I just do not think I want to risk not putting the clobetasol on based on what is been going on here currently. Patient voiced understanding. 05/04/2021 upon evaluation today patient is making excellent improvement. Overall there is definitely decrease in the size of the wound today and I am very pleased in that regard. I do not see any signs of active infection locally nor systemically at this point. No fevers, chills, nausea, vomiting, or diarrhea. Objective Constitutional Well-nourished and well-hydrated in no acute distress. Vitals Time Taken: 8:11 AM, Height: 71 in, Weight: 338 lbs, BMI: 47.1, Temperature: 98.4 F, Pulse: 82 bpm, Respiratory Rate: 18 breaths/min, Blood Pressure: 142/87 mmHg. Respiratory normal breathing without difficulty. Psychiatric this patient is able to make decisions and demonstrates good insight into disease process. Alert and Oriented x 3. pleasant and cooperative. General Notes: Patient's wound bed showed signs of good granulation and epithelization at this point. I do not see any signs of active currently and I think that to be honest the patient has done extremely well with the Apligraf. Put in the clobetasol down first did not seem to cause any Brotherton, Jaykub ([id number]) complication or problem. Integumentary (Hair, Skin) Wound #1 status is Open. Original cause of wound was Gradually Appeared. The date acquired was: 11/18/2015. The wound has been in treatment 230 weeks. The wound is located on the Left,Lateral Lower Leg. The wound measures 2.8cm length x  2.4cm width x 0.3cm depth; 5.278cm^2 area and 1.583cm^3 volume. There is Fat Layer (Subcutaneous Tissue) exposed. There is no tunneling or undermining noted. There is a medium amount of serosanguineous drainage noted. The wound margin is flat and intact. There is large (67-100%) red granulation within the wound bed. There is no necrotic tissue within the wound bed. Wound #1 status is Open. Original cause of wound was Gradually Appeared. The date acquired was: 11/18/2015. The wound has been in treatment 230 weeks. The wound is located on the Left,Lateral Lower Leg. The wound measures 2.8cm length x 2.4cm width x 0.3cm depth; 5.278cm^2 area and 1.583cm^3 volume. There is Fat Layer (Subcutaneous Tissue) exposed. There is no tunneling or undermining noted. There is a medium amount of serosanguineous drainage noted. The wound margin is flat and intact. There is large (67-100%) red granulation within the wound bed. There is a small (1-33%) amount of necrotic tissue within the wound bed including Adherent Slough. 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 A skin graft procedure using a bioengineered skin substitute/cellular or tissue based product was performed by  Jeri Cos E., PA-C with the following instrument(s): Forceps. Apligraf was applied and secured with Steri-Strips. 10 sq cm of product was utilized and 34 sq cm was wasted due to product size vs wound size. Post Application, mepitel one was applied. A Time Out was conducted at 08:38, prior to the start of the procedure. Post procedure Diagnosis Wound #1: Same as Pre-Procedure . Plan 1. Would  recommend currently that we going to continue with the wound care measures as before and the patient is in agreement with the plan. This includes the use of the Apligraf which has done also. I did put clobetasol down first and then apply the Apligraf over top this did great in the last week and his pain that he was experiencing previously has completely resolved. Obviously I think that this is the way to go. 2. I am also can recommend that we continue with the compression wrap. I do feel like that the compression wrap is doing an awesome job at this point. We will see patient back for reevaluation in 1 week here in the clinic. If anything worsens or changes patient will contact our office for additional recommendations. Electronic Signature(s) Signed: 05/04/2021 8:44:23 AM By: [name] Keeler PA-C Entered By: [name] on 05/04/2021 08:44:23 KHYAN, OATS ([id number]) -------------------------------------------------------------------------------- SuperBill Details Patient Name: [name] Date of Service: 05/04/2021 Medical Record Number: [id number] Patient Account Number: [id number] Date of Birth/Sex: 1980/08/25 (40 y.o. M) Treating RN: Donnamarie Poag Primary Care Provider: [name] Other Clinician: Referring Provider: [name] Treating Provider/Extender: [name] Cliche in Treatment: 230 Diagnosis Coding ICD-10 Codes Code Description I87.2 Venous insufficiency (chronic) (peripheral) L97.222 Non-pressure chronic ulcer of left calf with fat layer exposed E11.622 Type 2 diabetes mellitus with other skin ulcer L88 Pyoderma gangrenosum F17.208 Nicotine dependence, unspecified, with other nicotine-induced disorders Facility Procedures CPT4 Code: 08657846 Description: [phone] (Facility Use Only) Apligraf 44 SQ CM Modifier: Quantity: Lamont Code: [id number] Description: 01027 - SKIN SUB GRAFT TRNK/ARM/LEG Modifier: Quantity: 1 CPT4 Code: Description: ICD-10  Diagnosis Description L97.222 Non-pressure chronic ulcer of left calf with fat layer exposed Modifier: Quantity: Physician Procedures CPT4 Code: 2536644 Description: 03474 - WC PHYS SKIN SUB GRAFT TRNK/ARM/LEG Modifier: Quantity: 1 CPT4 Code: Description: ICD-10 Diagnosis Description L97.222 Non-pressure chronic ulcer of left calf with fat layer exposed Modifier: Quantity: Electronic Signature(s) Signed: 05/04/2021 8:45:30 AM By: [name] Keeler PA-C Entered By: [name] on 05/04/2021 08:45:29

## 2021-05-07 ENCOUNTER — Other Ambulatory Visit: Payer: Self-pay

## 2021-05-07 DIAGNOSIS — E11622 Type 2 diabetes mellitus with other skin ulcer: Secondary | ICD-10-CM | POA: Diagnosis not present

## 2021-05-09 ENCOUNTER — Other Ambulatory Visit: Payer: Self-pay

## 2021-05-09 DIAGNOSIS — E11622 Type 2 diabetes mellitus with other skin ulcer: Secondary | ICD-10-CM | POA: Diagnosis not present

## 2021-05-09 NOTE — Progress Notes (Signed)
ANTERIO, SCHEEL ([id number]) Visit Report for 05/07/2021 Arrival Information Details Patient Name: [name], [name] Date of Service: 05/07/2021 8:15 AM Medical Record Number: [id number] Patient Account Number: [id number] Date of Birth/Sex: [birth year] (40 y.o. M) Treating RN: Levora Dredge Primary Care Lorana Maffeo: [name] Other Clinician: Referring Sidi Dzikowski: [name] Treating Tylin Force/Extender: [name] Cliche in Treatment: 74 Visit Information History Since Last Visit Has Dressing in Place as Prescribed: Yes Patient Arrived: Ambulatory Has Compression in Place as Prescribed: Yes Arrival Time: 08:09 Pain Present Now: No Accompanied By: self Transfer Assistance: None Patient Identification Verified: Yes Secondary Verification Process Completed: Yes Patient Requires Transmission-Based No Precautions: Patient Has Alerts: Yes Patient Alerts: Patient has reaction to silver dressings. Electronic Signature(s) Signed: 05/09/2021 12:56:33 PM By: Levora Dredge Entered By: Levora Dredge on 05/07/2021 08:11:16 [name] ([id number]) -------------------------------------------------------------------------------- Compression Therapy Details Patient Name: [name] Date of Service: 05/07/2021 8:15 AM Medical Record Number: [id number] Patient Account Number: [id number] Date of Birth/Sex: July 15, 1980 (40 y.o. M) Treating RN: Levora Dredge Primary Care Izumi Mixon: [name] Other Clinician: Referring Valentina Alcoser: [name] Treating Shamyra Farias/Extender: [name] Cliche in Treatment: 230 Compression Therapy Performed for Wound Assessment: Wound #1 Left,Lateral Lower Leg Performed By: Clinician Levora Dredge, RN Compression Type: Four Layer Electronic Signature(s) Signed: 05/09/2021 12:56:33 PM By: Levora Dredge Entered By: Levora Dredge on 05/07/2021 08:35:42 [name], [name]  ([id number]) -------------------------------------------------------------------------------- Encounter Discharge Information Details Patient Name: [name] Date of Service: 05/07/2021 8:15 AM Medical Record Number: [id number] Patient Account Number: [id number] Date of Birth/Sex: February 26, 1981 (40 y.o. M) Treating RN: Levora Dredge Primary Care Eriyah Fernando: [name] Other Clinician: Referring Chai Routh: [name] Treating Fidel Caggiano/Extender: [name] Cliche in Treatment: 230 Encounter Discharge Information Items Discharge Condition: Stable Ambulatory Status: Ambulatory Discharge Destination: Home Transportation: Private Auto Accompanied By: self Schedule Follow-up Appointment: Yes Clinical Summary of Care: Electronic Signature(s) Signed: 05/09/2021 12:56:33 PM By: Levora Dredge Entered By: Levora Dredge on 05/07/2021 08:37:04 [name] ([id number]) -------------------------------------------------------------------------------- Wound Assessment Details Patient Name: [name] Date of Service: 05/07/2021 8:15 AM Medical Record Number: [id number] Patient Account Number: [id number] Date of Birth/Sex: 22-Jul-1980 (40 y.o. M) Treating RN: Levora Dredge Primary Care Felton Buczynski: [name] Other Clinician: Referring Millicent Blazejewski: [name] Treating Yeslin Delio/Extender: [name] Cliche in Treatment: 230 Wound Status Wound Number: 1 Primary Etiology: Pyoderma Wound Location: Left, Lateral Lower Leg Wound Status: Open Wounding Event: Gradually Appeared Comorbid History: Sleep Apnea, Hypertension, Colitis Date Acquired: 11/18/2015 Weeks Of Treatment: 230 Clustered Wound: No Wound Measurements Length: (cm) 2.8 Width: (cm) 2.4 Depth: (cm) 0.3 Area: (cm) 5.278 Volume: (cm) 1.583 % Reduction in Area: -7.5% % Reduction in Volume: 59.7% Epithelialization: Small (1-33%) Wound Description Classification: Full Thickness With Exposed Support  Structures Wound Margin: Flat and Intact Exudate Amount: Medium Exudate Type: Serosanguineous Exudate Color: red, brown Foul Odor After Cleansing: No Slough/Fibrino Yes Wound Bed Granulation Amount: Large (67-100%) Exposed Structure Granulation Quality: Red Fascia Exposed: No Necrotic Amount: Small (1-33%) Fat Layer (Subcutaneous Tissue) Exposed: Yes Necrotic Quality: Adherent Slough Tendon Exposed: No Muscle Exposed: No Joint Exposed: No Bone Exposed: No Electronic Signature(s) Signed: 05/09/2021 12:56:33 PM By: Levora Dredge Entered By: Levora Dredge on 05/07/2021 08:34:53

## 2021-05-09 NOTE — Progress Notes (Signed)
[name], [name] ([id number]) Visit Report for 05/09/2021 Arrival Information Details Patient Name: [name], [name] Date of Service: 05/09/2021 8:00 AM Medical Record Number: [id number] Patient Account Number: [id number] Date of Birth/Sex: [birth year] (40 y.o. M) Treating [name]: Donnamarie Poag Primary Care Kamil Hanigan: [name] Other Clinician: Referring Treshon Stannard: [name] Treating Tyniah Kastens/Extender: Yaakov Guthrie in Treatment: 231 Visit Information History Since Last Visit Added or deleted any medications: No Patient Arrived: Ambulatory Had a fall or experienced change in No Arrival Time: 08:05 activities of daily living that may affect Accompanied By: self risk of falls: Transfer Assistance: None Hospitalized since last visit: No Patient Identification Verified: Yes Has Dressing in Place as Prescribed: Yes Secondary Verification Process Completed: Yes Has Compression in Place as Prescribed: Yes Patient Requires Transmission-Based No Pain Present Now: No Precautions: Patient Has Alerts: Yes Patient Alerts: Patient has reaction to silver dressings. Electronic Signature(s) Signed: 05/09/2021 4:04:38 PM By: Donnamarie Poag Entered By: Donnamarie Poag on 05/09/2021 08:23:05 [name] ([id number]) -------------------------------------------------------------------------------- Compression Therapy Details Patient Name: [name] Date of Service: 05/09/2021 8:00 AM Medical Record Number: [id number] Patient Account Number: [id number] Date of Birth/Sex: 1981/01/11 (40 y.o. M) Treating [name]: Donnamarie Poag Primary Care Cambrie Sonnenfeld: [name] Other Clinician: Referring Aletta Edmunds: [name] Treating Keera Altidor/Extender: Yaakov Guthrie in Treatment: 231 Compression Therapy Performed for Wound Assessment: Wound #1 Left,Lateral Lower Leg Performed By: [name] Argyle, [name] Compression Type: Four Layer Electronic Signature(s) Signed: 05/09/2021 4:04:38 PM By:  Donnamarie Poag Entered By: Donnamarie Poag on 05/09/2021 08:27:41 [name] ([id number]) -------------------------------------------------------------------------------- Encounter Discharge Information Details Patient Name: [name] Date of Service: 05/09/2021 8:00 AM Medical Record Number: [id number] Patient Account Number: [id number] Date of Birth/Sex: July 28, 1980 (40 y.o. M) Treating [name]: Donnamarie Poag Primary Care Kimberle Stanfill: [name] Other Clinician: Referring Kyrstin Campillo: [name] Treating Shanequa Whitenight/Extender: Yaakov Guthrie in Treatment: 231 Encounter Discharge Information Items Discharge Condition: Stable Ambulatory Status: Ambulatory Discharge Destination: Home Transportation: Private Auto Accompanied By: self Schedule Follow-up Appointment: Yes Clinical Summary of Care: Electronic Signature(s) Signed: 05/09/2021 8:31:45 AM By: Donnamarie Poag Entered By: Donnamarie Poag on 05/09/2021 08:31:45 [name], [name] ([id number]) -------------------------------------------------------------------------------- Wound Assessment Details Patient Name: [name] Date of Service: 05/09/2021 8:00 AM Medical Record Number: [id number] Patient Account Number: [id number] Date of Birth/Sex: 04/10/1981 (40 y.o. M) Treating [name]: Donnamarie Poag Primary Care Rhina Kramme: [name] Other Clinician: Referring Lattie Cervi: [name] Treating Dustan Hyams/Extender: Yaakov Guthrie in Treatment: 231 Wound Status Wound Number: 1 Primary Etiology: Pyoderma Wound Location: Left, Lateral Lower Leg Wound Status: Open Wounding Event: Gradually Appeared Comorbid History: Sleep Apnea, Hypertension, Colitis Date Acquired: 11/18/2015 Weeks Of Treatment: 231 Clustered Wound: No Wound Measurements Length: (cm) 2.8 Width: (cm) 2.4 Depth: (cm) 0.3 Area: (cm) 5.278 Volume: (cm) 1.583 % Reduction in Area: -7.5% % Reduction in Volume: 59.7% Epithelialization: Small (1-33%) Wound  Description Classification: Full Thickness With Exposed Support Structures Wound Margin: Flat and Intact Exudate Amount: Medium Exudate Type: Serosanguineous Exudate Color: red, brown Foul Odor After Cleansing: No Slough/Fibrino Yes Wound Bed Granulation Amount: Large (67-100%) Exposed Structure Granulation Quality: Red Fascia Exposed: No Necrotic Amount: Small (1-33%) Fat Layer (Subcutaneous Tissue) Exposed: Yes Necrotic Quality: Adherent Slough Tendon Exposed: No Muscle Exposed: No Joint Exposed: No Bone Exposed: No Treatment Notes Wound #1 (Lower Leg) Wound Laterality: Left, Lateral Cleanser Wound Cleanser Discharge Instruction: Wash your hands with soap and water. Remove old dressing, discard into plastic bag and place into trash. Cleanse the wound with Wound Cleanser prior to applying a clean dressing using gauze sponges, not  tissues or cotton balls. Do not scrub or use excessive force. Pat dry using gauze sponges, not tissue or cotton balls. Peri-Wound Care Moisturizing Lotion Discharge Instruction: Suggestions: Theraderm, Eucerin, Cetaphil, or patient preference. Topical Primary Dressing Secondary Dressing Xtrasorb Medium 4x5 (in/in) Discharge Instruction: Apply to wound as directed. Do not cut. Secured With [employer], Heidi ([id number]) Medichoice 4 layer Compression System, 35-40 mmHG Discharge Instruction: Apply multi-layer wrap as directed. Compression Stockings Add-Ons Electronic Signature(s) Signed: 05/09/2021 4:04:38 PM By: Donnamarie Poag Entered ByDonnamarie Poag on 05/09/2021 08:27:12

## 2021-05-09 NOTE — Progress Notes (Signed)
SAVIAN, MAZON ([id number]) Visit Report for 05/09/2021 Physician Orders Details Patient Name: [name], [name] Date of Service: 05/09/2021 8:00 AM Medical Record Number: [id number] Patient Account Number: [id number] Date of Birth/Sex: 01-Sep-1980 (40 y.o. M) Treating RN: Hansel Feinstein Primary Care Provider: [name] Other Clinician: Referring Provider: [name] Treating Provider/Extender: Tilda Franco in Treatment: [phone] Verbal / Phone Orders: No Diagnosis Coding Follow-up Appointments o Return Appointment in 1 week. o Nurse Visit as needed - twice a week Bathing/ Shower/ Hygiene o Clean wound with Normal Saline or wound cleanser. Cellular or Tissue Based Products o Cellular or Tissue Based Product Type: - APLIGRAF #4 o Cellular or Tissue Based Product applied to wound bed; including contact layer, fixation with steri-strips, dry gauze and cover dressing. (DO NOT REMOVE). Edema Control - Lymphedema / Segmental Compressive Device / Other o Optional: One layer of unna paste to top of compression wrap (to act as an anchor). o Elevate, Exercise Daily and Avoid Standing for Long Periods of Time. o Elevate legs to the level of the heart and pump ankles as often as possible o Elevate leg(s) parallel to the floor when sitting. Wound Treatment Wound #1 - Lower Leg Wound Laterality: Left, Lateral Cleanser: Wound Cleanser 3 x Per Week/30 Days Discharge Instructions: Wash your hands with soap and water. Remove old dressing, discard into plastic bag and place into trash. Cleanse the wound with Wound Cleanser prior to applying a clean dressing using gauze sponges, not tissues or cotton balls. Do not scrub or use excessive force. Pat dry using gauze sponges, not tissue or cotton balls. Peri-Wound Care: Moisturizing Lotion 3 x Per Week/30 Days Discharge Instructions: Suggestions: Theraderm, Eucerin, Cetaphil, or patient preference. Secondary Dressing: Xtrasorb  Medium 4x5 (in/in) 3 x Per Week/30 Days Discharge Instructions: Apply to wound as directed. Do not cut. Compression Wrap: Medichoice 4 layer Compression System, 35-40 mmHG (Generic) 3 x Per Week/30 Days Discharge Instructions: Apply multi-layer wrap as directed. Electronic Signature(s) Signed: 05/09/2021 8:30:45 AM By: Hansel Feinstein Signed: 05/09/2021 8:55:42 AM By: Geralyn Corwin DO Entered By: Hansel Feinstein on 05/09/2021 08:30:44 Harner, [name] ([id number]) -------------------------------------------------------------------------------- SuperBill Details Patient Name: Laray [name] Date of Service: 05/09/2021 Medical Record Number: [id number] Patient Account Number: [id number] Date of Birth/Sex: 1980/12/31 (40 y.o. 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

## 2021-05-09 NOTE — Progress Notes (Signed)
[name], [name] ([id number]) Visit Report for 05/07/2021 Physician Orders Details Patient Name: [name], [name] Date of Service: 05/07/2021 8:15 AM Medical Record Number: [id number] Patient Account Number: [id number] Date of Birth/Sex: 08-01-1980 (40 y.o. M) Treating RN: Levora Dredge Primary Care Provider: [name] Other Clinician: Referring Provider: [name] Treating Provider/Extender: [name] Cliche in Treatment: [phone] Verbal / Phone Orders: No Diagnosis Coding Follow-up Appointments o Return Appointment in 1 week. o Nurse Visit as needed - twice a week Bathing/ Shower/ Hygiene o Clean wound with Normal Saline or wound cleanser. Cellular or Tissue Based Products o Cellular or Tissue Based Product Type: - APLIGRAF #4 o Cellular or Tissue Based Product applied to wound bed; including contact layer, fixation with steri-strips, dry gauze and cover dressing. (DO NOT REMOVE). Edema Control - Lymphedema / Segmental Compressive Device / Other o Optional: One layer of unna paste to top of compression wrap (to act as an anchor). o Elevate, Exercise Daily and Avoid Standing for Long Periods of Time. o Elevate legs to the level of the heart and pump ankles as often as possible o Elevate leg(s) parallel to the floor when sitting. Wound Treatment Wound #1 - Lower Leg Wound Laterality: Left, Lateral Cleanser: Wound Cleanser 3 x Per Week/30 Days Discharge Instructions: Wash your hands with soap and water. Remove old dressing, discard into plastic bag and place into trash. Cleanse the wound with Wound Cleanser prior to applying a clean dressing using gauze sponges, not tissues or cotton balls. Do not scrub or use excessive force. Pat dry using gauze sponges, not tissue or cotton balls. Peri-Wound Care: Moisturizing Lotion 3 x Per Week/30 Days Discharge Instructions: Suggestions: Theraderm, Eucerin, Cetaphil, or patient preference. Secondary Dressing: Xtrasorb  Medium 4x5 (in/in) 3 x Per Week/30 Days Discharge Instructions: Apply to wound as directed. Do not cut. Compression Wrap: Medichoice 4 layer Compression System, 35-40 mmHG (Generic) 3 x Per Week/30 Days Discharge Instructions: Apply multi-layer wrap as directed. Electronic Signature(s) Signed: 05/07/2021 5:42:10 PM By: Worthy Keeler PA-C Signed: 05/09/2021 12:56:33 PM By: Levora Dredge Entered By: Levora Dredge on 05/07/2021 08:36:20 [name], [name] ([id number]) -------------------------------------------------------------------------------- SuperBill Details Patient Name: [name] Date of Service: 05/07/2021 Medical Record Number: [id number] Patient Account Number: [id number] Date of Birth/Sex: 1980/11/02 (40 y.o. 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

## 2021-05-11 ENCOUNTER — Encounter: Payer: 59 | Admitting: Physician Assistant

## 2021-05-11 ENCOUNTER — Other Ambulatory Visit: Payer: Self-pay

## 2021-05-11 DIAGNOSIS — E11622 Type 2 diabetes mellitus with other skin ulcer: Secondary | ICD-10-CM | POA: Diagnosis not present

## 2021-05-11 NOTE — Progress Notes (Signed)
[name], [name] ([id number]) Visit Report for 05/11/2021 Arrival Information Details Patient Name: [name], [name] Date of Service: 05/11/2021 8:00 AM Medical Record Number: [id number] Patient Account Number: [id number] Date of Birth/Sex: 1981/01/24 (40 y.o. M) Treating RN: [name] Primary Care Shequilla Goodgame: [name] Other Clinician: Referring Kaelin Holford: [name] Treating Auriana Scalia/Extender: [name] Cliche in Treatment: 60 Visit Information History Since Last Visit Added or deleted any medications: No Patient Arrived: Ambulatory Has Dressing in Place as Prescribed: Yes Arrival Time: 08:10 Pain Present Now: No Accompanied By: self Transfer Assistance: None Patient Identification Verified: Yes Secondary Verification Process Completed: Yes Patient Requires Transmission-Based No Precautions: Patient Has Alerts: Yes Patient Alerts: Patient has reaction to silver dressings. Electronic Signature(s) Signed: 05/11/2021 12:21:29 PM By: [name], [name], RN, [name], [name], [name] Entered By: [name], [name], RN, [name], [name] on 05/11/2021 08:11:14 RORAN, WEGNER ([id number]) -------------------------------------------------------------------------------- Compression Therapy Details Patient Name: [name] Date of Service: 05/11/2021 8:00 AM Medical Record Number: [id number] Patient Account Number: [id number] Date of Birth/Sex: Sep 29, 1980 (40 y.o. M) Treating RN: [name] Primary Care Fahim Kats: [name] Other Clinician: Referring Charmeka Freeburg: [name] Treating Abdiaziz Klahn/Extender: [name] Cliche in Treatment: 231 Compression Therapy Performed for Wound Assessment: Wound #1 Left,Lateral Lower Leg Performed By: Clinician [name], RN Compression Type: Four Layer Pre Treatment ABI: 1.2 Post Procedure Diagnosis Same as Pre-procedure Electronic Signature(s) Signed: 05/11/2021 12:21:29 PM By: [name], [name], RN, [name], [name], [name] Entered By: [name], [name], RN, [name], [name] on 05/11/2021  08:41:36 [name] ([id number]) -------------------------------------------------------------------------------- Encounter Discharge Information Details Patient Name: [name] Date of Service: 05/11/2021 8:00 AM Medical Record Number: [id number] Patient Account Number: [id number] Date of Birth/Sex: 1980/08/28 (40 y.o. M) Treating RN: [name] Primary Care Sherel Fennell: [name] Other Clinician: Referring Alliyah Roesler: [name] Treating Ayahna Solazzo/Extender: [name] Cliche in Treatment: 231 Encounter Discharge Information Items Post Procedure Vitals Discharge Condition: Stable Temperature (F): 98.1 Ambulatory Status: Ambulatory Pulse (bpm): 94 Discharge Destination: Home Respiratory Rate (breaths/min): 16 Transportation: Private Auto Blood Pressure (mmHg): 153/80 Accompanied By: self Schedule Follow-up Appointment: Yes Clinical Summary of Care: Electronic Signature(s) Signed: 05/11/2021 12:21:29 PM By: [name], [name], RN, [name], [name], [name] Entered By: [name], [name], RN, [name], [name] on 05/11/2021 08:44:18 [name] ([id number]) -------------------------------------------------------------------------------- Lower Extremity Assessment Details Patient Name: [name] Date of Service: 05/11/2021 8:00 AM Medical Record Number: [id number] Patient Account Number: [id number] Date of Birth/Sex: 01-13-1981 (40 y.o. M) Treating RN: [name] Primary Care Krina Mraz: [name] Other Clinician: Referring Suzanna Zahn: [name] Treating Yari Szeliga/Extender: [name] Cliche in Treatment: 231 Edema Assessment Assessed: [Left: No] [Right: No] [Left: Edema] [Right: :] Calf Left: Right: Point of Measurement: 35 cm From Medial Instep 48 cm Ankle Left: Right: Point of Measurement: 10 cm From Medial Instep 29.5 cm Vascular Assessment Pulses: Dorsalis Pedis Palpable: [Left:Yes] Electronic Signature(s) Signed: 05/11/2021 12:21:29 PM By: [name], [name], RN, [name], [name],  [name] Entered By: [name], [name], RN, [name], [name] on 05/11/2021 08:22:23 [name] ([id number]) -------------------------------------------------------------------------------- Multi Wound Chart Details Patient Name: [name] Date of Service: 05/11/2021 8:00 AM Medical Record Number: [id number] Patient Account Number: [id number] Date of Birth/Sex: 01-22-1981 (40 y.o. 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] [N/A:N/A] Comorbid  History: [1:Sleep Apnea, Hypertension, Colitis] [N/A:N/A] Date Acquired: [1:11/18/2015] [N/A:N/A] Weeks of Treatment: [1:231] [N/A:N/A] Wound Status: [1:Open] [N/A:N/A] Measurements L x W x D (cm) [1:2.8x2.4x0.2] [N/A:N/A] Area (cm) : [1:5.278] [N/A:N/A] Volume (cm) : [1:1.056] [N/A:N/A] % Reduction in Area: [1:-7.50%] [N/A:N/A] % Reduction in Volume: [1:73.10%] [N/A:N/A] Classification: [1:Full Thickness With Exposed Support Structures] [N/A:N/A] Exudate Amount: [1:Medium] [N/A:N/A] Exudate Type: [1:Serosanguineous] [N/A:N/A] Exudate Color: [1:red, brown] [N/A:N/A] Wound Margin: [1:Flat and Intact] [N/A:N/A] Granulation Amount: [1:Large (67-100%)] [N/A:N/A] Granulation Quality: [1:Red] [N/A:N/A] Necrotic Amount: [1:Small (1-33%)] [N/A:N/A] Exposed Structures: [1:Fat Layer (Subcutaneous Tissue): Yes Fascia: No Tendon: No Muscle: No Joint: No Bone: No Medium (34-66%)] [N/A:N/A N/A] Treatment Notes Electronic Signature(s) Signed: 05/11/2021 12:21:29 PM By: [name], [name], RN, [name], [name]  RN, [name] Entered By: [name], [name], RN, [name], [name] on 05/11/2021 08:22:45 [name] ([id number]) -------------------------------------------------------------------------------- [place] Details Patient Name: [name] Date of Service: 05/11/2021 8:00 AM Medical Record Number: [id number] Patient Account Number: [id number] Date of Birth/Sex: 1980/10/03 (40 y.o. M) Treating RN: [name] Primary Care Genell Thede: [name] Other Clinician: Referring Marylynne Keelin: [name] Treating Ragnar Waas/Extender: [name] Cliche in Treatment: Egan reviewed with physician Active Inactive Necrotic Tissue Nursing Diagnoses: Impaired tissue integrity related to necrotic/devitalized tissue Knowledge deficit related to management of necrotic/devitalized tissue Goals: Necrotic/devitalized tissue will be minimized in the wound bed Date Initiated: 03/26/2021 Target Resolution Date: 05/26/2021 Goal Status: Active Patient/caregiver will verbalize understanding of reason and process for debridement of necrotic tissue Date Initiated: 03/26/2021 Date Inactivated: 04/27/2021 Target Resolution Date: 03/26/2021 Goal Status: Met Interventions: Assess patient pain level pre-, during and post procedure and prior to discharge Provide education on necrotic tissue and debridement process Treatment Activities: Apply topical anesthetic as ordered : 03/26/2021 Notes: Electronic Signature(s) Signed: 05/11/2021 12:21:29 PM By: [name], [name], RN, [name], [name], [name] Entered By: [name], [name], RN, [name], [name] on 05/11/2021 08:22:35 [name] ([id number]) -------------------------------------------------------------------------------- Pain Assessment Details Patient Name: [name] Date of Service: 05/11/2021 8:00 AM Medical Record Number: [id number] Patient Account Number: [id number] Date of Birth/Sex: [birth year] (40 y.o. M) Treating RN: [name] Primary Care Nicloe Frontera:  [name] Other Clinician: Referring Dereona Kolodny: [name] Treating Emmalin Jaquess/Extender: [name] Cliche in Treatment: 231 Active Problems Location of Pain Severity and Description of Pain Patient Has Paino No Site Locations Pain Management and Medication Current Pain Management: Notes Patient states more irritation than pain. Electronic Signature(s) Signed: 05/11/2021 12:21:29 PM By: [name], [name], RN, [name], [name], [name] Entered By: [name], [name], RN, [name], [name] on 05/11/2021 08:14:15 LUISMIGUEL, LAMERE ([id number]) -------------------------------------------------------------------------------- Patient/Caregiver Education Details Patient Name: [name] Date of Service: 05/11/2021 8:00 AM Medical Record Number: [id number] Patient Account Number: [id number] Date of Birth/Gender: May 02, 1981 (40 y.o. M) Treating RN: [name] Primary Care Physician: [name] Other Clinician: Referring Physician: [name] Treating Physician/Extender: [name] Cliche in Treatment: 231 Education Assessment Education Provided To: Patient Education Topics Provided Venous: Handouts: Controlling Swelling with Multilayered Compression Wraps Methods: Demonstration, Explain/Verbal Responses: State content correctly Wound/Skin Impairment: Handouts: Caring for Your Ulcer Methods: Demonstration, Explain/Verbal Responses: State content correctly Electronic Signature(s) Signed: 05/11/2021 12:21:29 PM By: [name], [name], RN, [name], [name], [name] Entered By: [name], [name], RN, [name], [name] on 05/11/2021 08:43:24 [name] ([id number]) -------------------------------------------------------------------------------- Wound Assessment Details Patient Name: [name] Date of Service: 05/11/2021 8:00 AM Medical Record Number: [id number] Patient Account Number: [id number] Date of Birth/Sex: 18-Feb-1981 (40 y.o. M) Treating RN: [name] Primary Care Ellary Casamento: [name] Other Clinician: Referring  Payslie Mccaig: [name]  Treating Ronnika Collett/Extender: Jeri Cos Weeks in Treatment: 231 Wound Status Wound Number: 1 Primary Etiology: Pyoderma Wound Location: Left, Lateral Lower Leg Wound Status: Open Wounding Event: Gradually Appeared Comorbid History: Sleep Apnea, Hypertension, Colitis Date Acquired: 11/18/2015 Weeks Of Treatment: 231 Clustered Wound: No Photos Photo Uploaded By: [name], [name], RN, [name], [name] on 05/11/2021 08:24:50 Wound Measurements Length: (cm) 2.8 Width: (cm) 2.4 Depth: (cm) 0.2 Area: (cm) 5.278 Volume: (cm) 1.056 % Reduction in Area: -7.5% % Reduction in Volume: 73.1% Epithelialization: Medium (34-66%) Tunneling: No Undermining: No Wound Description Classification: Full Thickness With Exposed Support Structures Wound Margin: Flat and Intact Exudate Amount: Medium Exudate Type: Serosanguineous Exudate Color: red, brown Foul Odor After Cleansing: No Slough/Fibrino Yes Wound Bed Granulation Amount: Large (67-100%) Exposed Structure Granulation Quality: Red Fascia Exposed: No Necrotic Amount: Small (1-33%) Fat Layer (Subcutaneous Tissue) Exposed: Yes Necrotic Quality: Adherent Slough Tendon Exposed: No Muscle Exposed: No Joint Exposed: No Bone Exposed: No Treatment Notes Wound #1 (Lower Leg) Wound Laterality: Left, Lateral Cleanser Wound Cleanser Discharge Instruction: Wash your hands with soap and water. Remove old dressing, discard into plastic bag and place into trash. Cleanse the wound with Wound Cleanser prior to applying a clean dressing using gauze sponges, not tissues or cotton balls. Do not Deane, Shaquile ([id number]) scrub or use excessive force. Pat dry using gauze sponges, not tissue or cotton balls. Peri-Wound Care Moisturizing Lotion Discharge Instruction: Suggestions: Theraderm, Eucerin, Cetaphil, or patient preference. Topical Primary Dressing Secondary Dressing Xtrasorb Medium 4x5 (in/in) Discharge Instruction: Apply to  wound as directed. Do not cut. Secured With Compression Wrap Medichoice 4 layer Compression System, 35-40 mmHG Discharge Instruction: Apply multi-layer wrap as directed. Compression Stockings Environmental education officer) Signed: 05/11/2021 12:21:29 PM By: [name], [name], RN, [name], [name], [name] Entered By: [name], [name], RN, [name], [name] on 05/11/2021 08:21:25 JAMAREON, SHIMEL ([id number]) -------------------------------------------------------------------------------- [place] Details Patient Name: [name] Date of Service: 05/11/2021 8:00 AM Medical Record Number: [id number] Patient Account Number: [id number] Date of Birth/Sex: 1981/03/15 (40 y.o. M) Treating RN: [name] Primary Care Skyrah Krupp: [name] Other Clinician: Referring Dalton Mille: [name] Treating Ether Wolters/Extender: [name] Cliche in Treatment: 231 Vital Signs Time Taken: 08:12 Temperature (F): 98.1 Height (in): 71 Pulse (bpm): 94 Weight (lbs): 338 Respiratory Rate (breaths/min): 18 Body Mass Index (BMI): 47.1 Blood Pressure (mmHg): 153/80 Reference Range: 80 - 120 mg / dl Electronic Signature(s) Signed: 05/11/2021 12:21:29 PM By: [name], [name], RN, [name], [name], [name] Entered By: [name], [name], RN, [name], [name] on 05/11/2021 08:13:46

## 2021-05-11 NOTE — Progress Notes (Addendum)
[name], [name] ([id number]) Visit Report for 05/11/2021 Chief Complaint Document Details Patient Name: [name], [name] Date of Service: 05/11/2021 8:00 AM Medical Record Number: [id number] Patient Account Number: [id number] Date of Birth/Sex: 11/24/1980 (40 y.o. 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 M) Treating RN: [name] Primary Care Provider: [name] Other Clinician: Referring Provider: [name] Treating Provider/Extender: [name] Cliche in Treatment: 6 Cellular or Tissue Based Product Type Wound #1 Left,Lateral Lower Leg Applied to: Performed By: Physician Tommie Sams., PA-C Cellular or Tissue Based Product Apligraf Type: Level of Consciousness (Pre- Awake and Alert procedure): Pre-procedure Verification/Time Out Yes - 08:39 Taken: Location: trunk / arms / legs Wound Size (sq cm): 6.72 Product Size (sq cm): 44 Waste Size (sq cm): 22 Waste Reason: wound size Amount of Product Applied (sq cm): 22 Instrument Used: Forceps, Scissors Lot #: [id number] Expiration Date: 05/15/2021 Fenestrated: Yes Instrument: Blade Reconstituted: No Secured: Yes Secured With: Steri-Strips Dressing Applied: Yes Primary Dressing: mepitel one Procedural Pain: 0 Post  Procedural Pain: 0 Response to Treatment: Procedure was tolerated well Level of Consciousness (Post- Awake and Alert procedure): Post Procedure Diagnosis Same as Pre-procedure Electronic Signature(s) Signed: 05/11/2021 12:21:29 PM By: [name], [name], RN, [name], [name], [name] Entered By: [name], [name], RN, [name], [name] on 05/11/2021 08:41:13 [name] ([id number]) -------------------------------------------------------------------------------- HPI Details Patient Name: [name] Date of Service: 05/11/2021 8:00 AM Medical Record Number: [id number] Patient Account Number: [id number] Date of Birth/Sex: [birth year] (40 y.o. M) Treating RN: [name] Primary Care Provider: [name] Other Clinician: Referring Provider: [name] Treating Provider/Extender: [name] Cliche in Treatment: 231 History of Present Illness HPI Description: 12/04/16; 40 year old man who comes into the clinic today for review of a wound on the posterior left calf. He tells me that is been there for about a year. He is not a diabetic he does smoke half a pack per day. He was seen in the ER on 11/20/16 felt to have cellulitis around the wound and was given clindamycin. An x-ray did not show osteomyelitis. The patient initially tells me that he has a milk allergy that sets off a pruritic itching rash on his lower legs which she scratches incessantly and he thinks that's what may have set up the wound. He has been using various topical antibiotics and ointments without any effect. He works in a [employer] and is on his feet all day. He does not have a prior history of wounds however he does have the rash on both lower legs the right arm and the ventral aspect of his left arm. These are excoriations and clearly have had scratching however there are of macular looking areas on both legs including a substantial larger area on the right leg. This does not have an underlying open area. There is no blistering. The patient  tells me that 2 years ago in [place] in response to the rash on his legs he saw a dermatologist who told him he had a condition which may be  pyoderma gangrenosum although I may be putting words into his mouth. He seemed to recognize this. On further questioning he admits to a 5 year history of quiesced. ulcerative colitis. He is not in any treatment for this. He's had no recent travel 12/11/16; the patient arrives today with his wound and roughly the same condition we've been using silver alginate this is a deep punched out wound with some surrounding erythema but no tenderness. Biopsy I did did not show confirmed pyoderma gangrenosum suggested nonspecific inflammation and vasculitis but does not provide an actual description of what was seen by the pathologist. I'm really not able to understand this We have also received information from the patient's dermatologist in [place] notes from April 2016. This was a doctor Agarwal-antal. 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 He was prescribed topical steroid high potency under occlusion which helped but at this point the patient did not have a deep punched out wound. 12/18/16; the patient's wound is larger in terms of surface area however this surface looks better and there is less depth. The surrounding erythema also is better. The patient states that the wrap we put on came off 2 days ago when he has been using his compression stockings. He we are in the process of getting a dermatology consult. 12/26/16 on evaluation today patient's left lower extremity wound shows evidence of infection with surrounding erythema noted. He has been tolerating the dressing changes but states that he has noted more discomfort. There is a larger area of erythema surrounding the wound. No fevers, chills, nausea, or vomiting noted at this time. With that being said the wound still does have slough covering the surface. He is not allergic to any medication that he is  aware of at this point. In regard to his right lower extremity he had several regions that are erythematous and pruritic he wonders if there's anything we can do to help that. 01/02/17 I reviewed patient's wound culture which was obtained his visit last week. He was placed on doxycycline at that point. Unfortunately that does not appear to be an antibiotic that would likely help with the situation however the pseudomonas noted on culture is sensitive to Cipro. Also unfortunately patient's wound seems to have a large compared to last week's evaluation. Not severely so but there are definitely increased measurements in general. He is continuing to have discomfort as well he writes this to be a seven out of 10. In fact he would prefer me not to perform any debridement today due to the fact that he is having discomfort and considering he has an active infection on the little reluctant to do so anyway. No fevers, chills, nausea, or vomiting noted at this time. 01/08/17; patient seems dermatology on September 5. I suspect dermatology will want the slides from the biopsy I did sent to their pathologist. I'm not sure if there is a way we can expedite that. In any case the culture I did before I left on vacation 3 weeks ago showed Pseudomonas he was given 10 days of Cipro and per her description of her intake nurses is actually somewhat better this week although the wound is quite a bit bigger than I remember the last time I saw this. He still has 3 more days of Cipro 01/21/17; dermatology appointment tomorrow. He has completed the ciprofloxacin for Pseudomonas. Surface of the wound looks better however he is had some deterioration in the lesions on his right leg. Meantime the left lateral  leg wound we will continue with sample 01/29/17; patient had his dermatology appointment but I can't yet see that note. He is completed his antibiotics. The wound is more superficial but considerably larger in circumferential  area than when he came in. This is in his left lateral calf. He also has swollen erythematous areas with superficial wounds on the right leg and small papular areas on both arms. There apparently areas in her his upper thighs and buttocks I did not look at those. Dermatology biopsied the right leg. Hopefully will have their input next week. 02/05/17; patient went back to see his dermatologist who told him that he had a "scratching problem" as well as staph. He is now on a 30 day course of doxycycline and I believe she gave him triamcinolone cream to the right leg areas to help with the itching [not exactly sure but probably triamcinolone]. She apparently looked at the left lateral leg wound although this was not rebiopsied and I think felt to be ultimately part of the same pathogenesis. He is using sample border foam and changing nevus himself. He now has a new open area on the right posterior leg which was his biopsy site I don't have any of the dermatology notes 02/12/17; we put the patient in compression last week with SANTYL to the wound on the left leg and the biopsy. Edema is much better and the depth of the wound is now at level of skin. Area is still the same oBiopsy site on the right lateral leg we've also been using santyl with a border foam dressing and he is changing this himself. 02/19/17; Using silver alginate started last week to both the substantial left leg wound and the biopsy site on the right wound. He is tolerating compression well. Has a an appointment with his primary M.D. tomorrow wondering about diuretics although I'm wondering if the edema problem is actually lymphedema 02/26/17; the patient has been to see his primary doctor Dr. Jerrel Ivory at [facility] our primary care. She started him on Lasix 20 mg and this seems to have helped with the edema. However we are not making substantial change with the left lateral calf wound and inflammation. The biopsy site on the right leg  also looks stable but not really all that different. 03/12/17; the patient has been to see vein and vascular Dr. Lucky Cowboy. He has had venous reflux studies I have not reviewed these. I did get a call from his dermatology office. They felt that he might have pathergy based on their biopsy on his right leg which led them to look at the slides of NELLIE, CHEVALIER ([id number]) the biopsy I did on the left leg and they wonder whether this represents pyoderma gangrenosum which was the original supposition in a man with ulcerative colitis albeit inactive for many years. They therefore recommended clobetasol and tetracycline i.e. aggressive treatment for possible pyoderma gangrenosum. 03/26/17; apparently the patient just had reflux studies not an appointment with Dr. dew. She arrives in clinic today having applied clobetasol for 2-3 weeks. He notes over the last 2-3 days excessive drainage having to change the dressing 3-4 times a day and also expanding erythema. 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 Lillia Carmel at [facility] dermatology locally  who kindly saw him at my request. 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 My concern was the degree of erythema medially extending from his left leg wound which was either inflammation from pyoderma or cellulitis. I put him on Augmentin however culture of the wound showed Pseudomonas which is quinolone sensitive. I really don't believe he has cellulitis however in view of everything I will continue and give him a course of Cipro. He is also on doxycycline as an immune modulator for the pyoderma. In addition to his original wound on the left lateral leg with  surrounding erythema he has a wound on the right posterior calf which was an original biopsy site done by dermatology. 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 Saw Dr. Lillia Carmel yesterday. He has been using topical antibiotics to both wound areas his original wound on the left and the biopsies/pathergy area on the right. There is definitely some improvement in the inflammation around the wound on the right although the patient states he has increasing sensitivity of the wounds. He is on prednisone 60 and doxycycline 1 as prescribed by Dr. Lillia Carmel. He is covering the topical antibiotic with gauze and putting this in his own compression stocks and changing this daily. He states that Dr. Lottie Rater did a culture of the left leg wound yesterday 05/07/17; pyoderma gangrenosum. The patient saw Dr. Lillia Carmel yesterday and has a follow-up with her in one month. He is still using topical antibiotics to both wounds although he can't recall exactly what type. He is still on prednisone 60 mg. Dr. Lillia Carmel stated that the doxycycline could stop if we were in agreement. 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 The right medial leg was induced by biopsy/pathergy. The area on the right is essentially healed. 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 The right medial leg has closed and remains closed. He is still on prednisone 60. oHe tells me he missed his last dermatology appointment with Dr. Lillia Carmel but will make another appointment. He reports that her blood sugar at a recent screen in [place] was high 200's. He was 180 today. 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 I think he is going to require still much longer prednisone perhaps another 3  months before attempting to taper. In the meantime his wound is a lot better. Smaller. He is cleaning this off daily and applying topical antibiotics. When he was last in the clinic I thought about changing to [facility] and actually put in a couple of calls to dermatology although probably not during their business hours. 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 He continues on oral prednisone. He has been using triple antibiotic ointment. There is surface debris and we will transition to [facility] and have him return in 2 weeks. He has lost 30 pounds since his last appointment with lifestyle modification. He may benefit from topical steroid cream for treatment this can be considered at a later date. 08/22/17 on evaluation today patient appears to actually be doing rather well in regard to his left lateral lower extremity ulcer. He has actually been  managed by Dr. Dellia Nims most recently. Patient is currently on oral steroids at this time. This seems to have been of benefit for him. Nonetheless his last visit was actually with Leah on 08/06/17. Currently he is not utilizing any topical steroid creams although this could be of benefit as well. No fevers, chills, nausea, or vomiting noted at this time. 09/05/17 on evaluation today patient appears to be doing better in regard to his left lateral lower extremity ulcer. He has been tolerating the dressing changes without complication. He is using Santyl with good effect. Overall I'm very pleased with how things are standing at this point. Patient likewise is happy that this is doing better. 09/19/17 on evaluation today patient actually appears to be doing rather well in regard to his left lateral lower extremity ulcer. 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  He's not having any significant pain. 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 He has been tolerating the Santyl without complication and in general I feel like he's making good progress. 10/17/17 on evaluation today patient appears to be doing very well in regard to his left lateral lower surety ulcer. He has been tolerating the dressing changes without complication. There does not appear to be any evidence of infection he's alternating the Santyl and the triple antibiotic ointment every other day this seems to be doing well for him. 11/03/17 on evaluation today patient appears to be doing very well in regard to his left lateral lower extremity ulcer. He is been tolerating the dressing changes without complication which is good news. Fortunately there does not appear to be any evidence of infection which is also great news. Overall is doing excellent they are starting to taper down on the prednisone is down to 40 mg at this point it also started topical clobetasol for him. 11/17/17 on evaluation today patient appears to be doing well in regard to his left lateral lower surety ulcer. He's been tolerating the dressing changes without complication. He does note that he is having no pain, no excessive drainage or discharge, and overall he feels like things are going about how he would expect and hope they would. Overall he seems to have no evidence of infection at this time in my opinion which is good news. 12/04/17-He is seen in follow-up evaluation for right lateral lower extremity ulcer. He has been applying topical steroid cream. Today's measurement show slight increase in size. Over the next 2 weeks we will transition to every other day Santyl and steroid cream. He has been encouraged to monitor for changes and notify clinic with any concerns 12/15/17 on evaluation today patient's left lateral  motion the ulcer and fortunately is doing worse again at this point. This just since last week to this week has close to doubled in size according to the patient. I did not seeing last week's I do not have a visual to compare this to in our system was also down so we do not have all the charts and at this point. Nonetheless it does have me somewhat concerned in regard to the fact that again he was worried enough about it he has contact the dermatology that placed them back on the full strength, 50 mg a day of the prednisone that he was taken previous. He continues to alternate using clobetasol  along with Santyl at this point. He is obviously somewhat frustrated. 12/22/17 on evaluation today patient appears to be doing a little worse compared to last evaluation. Unfortunately the wound is a little deeper and slightly larger than the last week's evaluation. With that being said he has made some progress in regard to the irritation surrounding at this time unfortunately despite that progress that's been made he still has a significant issue going on here. 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 AMARION, PORTELL ([id number]) For that reason I discussed with him today the possibility of perform a wound culture to ensure there's nothing overtly infected. 01/06/18 on evaluation today patient's wound is larger and deeper than previously evaluated. With that being said it did appear that his wound was infected after my last evaluation with him. Subsequently I did end up prescribing a prescription for Bactrim DS which she has been taking and having no complication with. Fortunately there does not appear to be any evidence of infection at this point in time as far as anything spreading, no want to touch, and overall I feel like things are showing signs of  improvement. 01/13/18 on evaluation today patient appears to be even a little larger and deeper than last time. There still muscle exposed in the base of the wound. Nonetheless he does appear to be less erythematous I do believe inflammation is calming down also believe the infection looks like it's probably resolved at this time based on what I'm seeing. No fevers, chills, nausea, or vomiting noted at this time. 01/30/18 on evaluation today patient actually appears to visually look better for the most part. Unfortunately those visually this looks better he does seem to potentially have what may be an abscess in the muscle that has been noted in the central portion of the wound. This is the first time that I have noted what appears to be fluctuance in the central portion of the muscle. With that being said I'm somewhat more concerned about the fact that this might indicate an abscess formation at this location. I do believe that an ultrasound would be appropriate. This is likely something we need to try to do as soon as possible. He has been switch to mupirocin ointment and he is no longer using the steroid ointment as prescribed by dermatology he sees them again next week he's been decreased from 60 to 40 mg of prednisone. 03/09/18 on evaluation today patient actually appears to be doing a little better compared to last time I saw him. There's not as much erythema surrounding the wound itself. He I did review his most recent infectious disease note which was dated 02/24/18. He saw Dr. Michel Bickers in [place]. 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 Patient is now off of antibiotics and they are continually observing this. There seems to be no change in the past two weeks in my pinion based on what the patient says and what I see today compared to what Dr. Megan Salon likely saw two weeks ago. No fevers, chills,  nausea, or vomiting noted at this time. 03/23/18 on evaluation today patient's wound actually appears to be showing signs of improvement which is good news. He is currently still on the Dapsone. He is also working on tapering the  prednisone to get off of this and Dr. Lottie Rater is working with him in this regard. Nonetheless overall I feel like the wound is doing well it does appear based on the infectious disease note that I reviewed from Dr. 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 I did also review the results of his ultrasound of left lower extremity which revealed there was a dentist tissue in the base of the wound without an abscess noted. 04/06/18 on evaluation today the patient's left lateral lower extremity ulcer actually appears to be doing fairly well which is excellent news. There does not appear to be any evidence of infection at this time which is also great news. Overall he still does have a significantly large ulceration although little by little he seems to be making progress. He is down to 10 mg a day of the prednisone. 04/20/18 on evaluation today patient actually appears to be doing excellent at this time in regard to his left lower extremity ulcer. He's making signs of good progress unfortunately this is taking much longer than we would really like to see but nonetheless he is making progress. Fortunately there does not appear to be any evidence of infection at this time. No fevers, chills, nausea, or vomiting noted at this time. The patient has not been using the Santyl due to the cost he hadn't got in this field yet. He's mainly been using the antibiotic ointment topically. Subsequently he also tells me that he really has not been scrubbing in the shower I think this would be helpful again as I told him it doesn't have to be anything too aggressive to even make it  believe just enough to keep it free of some of the loose slough and biofilm on the wound surface. 05/11/18 on evaluation today patient's wound appears to be making slow but sure progress in regard to the left lateral lower extremity ulcer. He is been tolerating the dressing changes without complication. Fortunately there does not appear to be any evidence of infection at this time. He is still just using triple antibiotic ointment along with clobetasol occasionally over the area. He never got the Santyl and really does not seem to intend to in my pinion. 06/01/18 on evaluation today patient appears to be doing a little better in regard to his left lateral lower extremity ulcer. He states that overall he does not feel like he is doing as well with the Dapsone as he did with the prednisone. Nonetheless he sees his dermatologist later today and is gonna talk to them about the possibility of going back on the prednisone. Overall again I believe that the wound would be better if you would utilize Santyl but he really does not seem to be interested in going back to the [place] at this point. He has been using triple antibiotic ointment. 06/15/18 on evaluation today patient's wound actually appears to be doing about the same at this point. Fortunately there is no signs of infection at this time. He has made slight improvements although he continues to not really want to clean the wound bed at this point. He states that he just doesn't mess with it he doesn't want to cause any problems with everything else he has going on. He has been on medication, antibiotics as prescribed by his dermatologist, for a staff infection of his lower extremities which is really drying  out now and looking much better he tells me. Fortunately there is no sign of overall infection. 06/29/18 on evaluation today patient appears to be doing well in regard to his left lateral lower surety ulcer all things considering. Fortunately his staff  infection seems to be greatly improved compared to previous. He has no signs of infection and this is drying up quite nicely. He is still the doxycycline for this is no longer on cental, Dapsone, or any of the other medications. His dermatologist has recommended possibility of an infusion but right now he does not want to proceed with that. 07/13/18 on evaluation today patient appears to be doing about the same in regard to his left lateral lower surety ulcer. Fortunately there's no signs of infection at this time which is great news. Unfortunately he still builds up a significant amount of Slough/biofilm of the surface of the wound he still is not really cleaning this as he should be appropriately. Again I'm able to easily with saline and gauze remove the majority of this on the surface which if you would do this at home would likely be a dramatic improvement for him as far as getting the area to improve. Nonetheless overall I still feel like he is making progress is just very slow. I think Santyl will be of benefit for him as well. Still he has not gotten this as of this point. 07/27/18 on evaluation today patient actually appears to be doing little worse in regards of the erythema around the periwound region of the wound he also tells me that he's been having more drainage currently compared to what he was experiencing last time I saw him. He states not quite as bad as what he had because this was infected previously but nonetheless is still appears to be doing poorly. Fortunately there is no evidence of systemic infection at this point. The patient tells me that he is not going to be able to afford the Santyl. He is still waiting to hear about the infusion therapy with his dermatologist. Apparently she wants an updated colonoscopy first. 08/10/18 on evaluation today patient appears to be doing better in regard to his left lateral lower extremity ulcer. Fortunately he is showing signs of improvement in  this regard he's actually been approved for Remicade infusion's as well although this has not been scheduled as of yet. Fortunately there's no signs of active infection at this time in regard to the wound although he is having some issues with infection of the right lower extremity is been seen as dermatologist for this. Fortunately they are definitely still working with him trying to keep things under control. DYLANN, GALLIER ([id number]) 09/07/18 on evaluation today patient is actually doing rather well in regard to his left lateral lower extremity ulcer. He notes these actually having some hair grow back on his extremity which is something he has not seen in years. He also tells me that the pain is really not giving them any trouble at this time which is also good news overall she is very pleased with the progress he's using a combination of the mupirocin along with the probate is all mixed. 09/21/18 on evaluation today patient actually appears to be doing fairly well all things considered in regard to his looks from the ulcer. He's been tolerating the dressing changes without complication. Fortunately there's no signs of active infection at this time which is good news he is still on all antibiotics or prevention of the staff infection. He  has been on prednisone for time although he states it is gonna contact his dermatologist and see if she put them on a short course due to some irritation that he has going on currently. Fortunately there's no evidence of any overall worsening this is going very slow I think cental would be something that would be helpful for him although he states that $50 for tube is quite expensive. He therefore is not willing to get that at this point. 10/06/18 on evaluation today patient actually appears to be doing decently well in regard to his left lateral leg ulcer. He's been tolerating the dressing changes without complication. Fortunately there's no signs of active infection  at this time. Overall I'm actually rather pleased with the progress he's making although it's slow he doesn't show any signs of infection and he does seem to be making some improvement. I do believe that he may need a switch up and dressings to try to help this to heal more appropriately and quickly. 10/19/18 on evaluation today patient actually appears to be doing better in regard to his left lateral lower extremity ulcer. This is shown signs of having much less Slough buildup at this point due to the fact he has been using the [employer]. Obviously this is very good news. The overall size of the wound is not dramatically smaller but again the appearance is. 11/02/18 on evaluation today patient actually appears to be doing quite well in regard to his lower [place] ulcer. A lot of the skin around the ulcer is actually somewhat irritating at this point this seems to be more due to the dressing causing irritation from the adhesive that anything else. Fortunately there is no signs of active infection at this time. 11/24/18 on evaluation today patient appears to be doing a little worse in regard to his overall appearance of his lower extremity ulcer. There's more erythema and warmth around the wound unfortunately. He is currently on doxycycline which he has been on for some time. With that being said I'm not sure that seems to be helping with what appears to possibly be an acute cellulitis with regard to his left lower extremity ulcer. No fevers, chills, nausea, or vomiting noted at this time. 12/08/18 on evaluation today patient's wounds actually appears to be doing significantly better compared to his last evaluation. He has been using Santyl along with alternating tripling about appointment as well as the steroid cream seems to be doing quite well and the wound is showing signs of improvement which is excellent news. Fortunately there's no evidence of infection and in fact his culture came back negative with  only normal skin flora noted. 12/21/2018 upon evaluation today patient actually appears to be doing excellent with regard to his ulcer. This is actually the best that I have seen it since have been helping to take care of him. It is both smaller as well as less slough noted on the surface of the wound and seems to be showing signs of good improvement with new skin growing from the edges. He has been using just the triamcinolone he does wonder if he can get a refill of that ointment today. 01/04/2019 upon evaluation today patient actually appears to be doing well with regard to his left lateral lower extremity ulcer. With that being said it does not appear to be that he is doing quite as well as last time as far as progression is concerned. There does not appear to be any signs of infection or  significant irritation which is good news. With that being said I do believe that he may benefit from switching to a collagen based dressing based on how clean The wound appears. 01/18/2019 on evaluation today patient actually appears to be doing well with regard to his wound on the left lower extremity. He is not made a lot of progress compared to where we were previous but nonetheless does seem to be doing okay at this time which is good news. There is no signs of active infection which is also good news. My only concern currently is I do wish we can get him into utilizing the collagen dressing his insurance would not pay for the supplies that we ordered although it appears that he may be able to order this through his supply company that he typically utilizes. This is [employer]. Nonetheless he did try to order it during the office visit today and it appears this did go through. We will see if he can get that it is a different brand but nonetheless he has collagen and I do think will be beneficial. 02/01/2019 on evaluation today patient actually appears to be doing a little worse today in regard to the overall size  of his wounds. Fortunately there is no signs of active infection at this time. That is visually. Nonetheless when this is happened before it was due to infection. For that reason were somewhat concerned about that this time as well. 02/08/2019 on evaluation today patient unfortunately appears to be doing slightly worse with regard to his wound upon evaluation today. Is measuring a little deeper and a little larger unfortunately. I am not really sure exactly what is causing this to enlarge he actually did see his dermatologist she is going to see about initiating Humira for him. Subsequently she also did do steroid injections into the wound itself in the periphery. Nonetheless still nonetheless he seems to be getting a little bit larger he is gone back to just using the steroid cream topically which I think is appropriate. I would say hold off on the collagen for the time being is definitely a good thing to do. Based on the culture results which we finally did get the final result back regarding it shows staph as the bacteria noted again that can be a normal skin bacteria based on the fact however he is having increased drainage and worsening of the wound measurement wise I would go ahead and place him on an antibiotic today I do believe for this. 02/15/2019 on evaluation today patient actually appears to be doing somewhat better in regard to his ulcer. There is no signs of worsening at this time I did review his culture results which showed evidence of Staphylococcus aureus but not MRSA. Again this could just be more related to the normal skin bacteria although he states the drainage has slowed down quite a bit he may have had a mild infection not just colonization. And was much smaller and then since around10/04/2019 on evaluation today patient appears to be doing unfortunately worse as far as the size of the wound. I really feel like that this is steadily getting larger again it had been doing  excellent right at the beginning of September we have seen a steady increase in the area of the wound it is almost 2-1/2 times the size it was on September 1. Obviously this is a bad trend this is not wanting to see. For that reason we went back to using just the topical  triamcinolone cream which does seem to help with inflammation. I checked him for bacteria by way of culture and nothing showed positive there. I am considering giving him a short course of a tapering steroid Dosepak today to see if that is can be beneficial for him. The patient is in agreement with giving that a try. 03/08/2019 on evaluation today patient appears to be doing very well in comparison to last evaluation with regard to his lower extremity ulcer. This is showing signs of less inflammation and actually measuring slightly smaller compared to last time every other week over the past month and a half he has been measuring larger larger larger. Nonetheless I do believe that the issue has been inflammation the prednisone does seem to [facility], Alexie ([id number]) have been beneficial for him which is good news. No fevers, chills, nausea, vomiting, or diarrhea. 03/22/2019 on evaluation today patient appears to be doing about the same with regard to his leg ulcer. He has been tolerating the dressing changes without complication. With that being said the wound seems to be mostly arrested at its current size but really is not making any progress except for when we prescribed the prednisone. He did show some signs of dropping as far as the overall size of the wound during that interval week. Nonetheless this is something he is not on long-term at this point and unfortunately I think he is getting need either this or else the Humira which his dermatologist has discussed try to get approval for. With that being said he will be seeing his dermatologist on the 11th of this month that is November. 04/19/2019 on evaluation today patient  appears to be doing really about the same the wound is measuring slightly larger compared to last time I saw him. He has not been into the office since November 2 due to the fact that he unfortunately had Covid as that his entire family. He tells me that it was rough but they did pull-through and he seems to be doing much better. Fortunately there is no signs of active infection at this time. No fevers, chills, nausea, vomiting, or diarrhea. 05/10/2019 on evaluation today patient unfortunately appears to be doing significantly worse as compared to last time I saw him. He does tell me that he has had his first dose of Humira and actually is scheduled to get the next one in the upcoming week. With that being said he tells me also that in the past several days he has been having a lot of issues with green drainage she showed me a picture this is more blue-green in color. He is also been having issues with increased sloughy buildup and the wound does appear to be larger today. Obviously this is not the direction that we want everything to take based on the starting of his Humira. Nonetheless I think this is definitely a result of likely infection and to be honest I think this is probably Pseudomonas causing the infection based on what I am seeing. 05/24/2019 on evaluation today patient unfortunately appears to be doing significantly worse compared to his prior evaluation with me 2 weeks ago. I did review his culture results which showed that he does have Staph aureus as well as Pseudomonas noted on the culture. Nonetheless the Levaquin that I prescribed for him does not appear to have been appropriate and in fact he tells me he is no longer experiencing the green drainage and discharge that he had at the last visit. Fortunately there  is no signs of active infection at this time which is good news although the wound has significantly worsened it in fact is much deeper than it was previous. We have been  utilizing up to this point triamcinolone ointment as the prescription topical of choice but at this time I really feel like that the wound is getting need to be packed in order to appropriately manage this due to the deeper nature of the wound. Therefore something along the lines of an alginate dressing may be more appropriate. 05/31/2019 upon inspection today patient's wound actually showed signs of doing poorly at this point. Unfortunately he just does not seem to be making any good progress despite what we have tried. He actually did go ahead and pick up the Cipro and start taking that as he was noticing more green drainage he had previously completed the Levaquin that I prescribed for him as well. Nonetheless he missed his appointment for the seventh last week on Wednesday with the wound care center and [facility] where his dermatologist referred him. Obviously I do think a second opinion would be helpful at this point especially in light of the fact that the patient seems to be doing so poorly despite the fact that we have tried everything that I really know how at this point. The only thing that ever seems to have helped him in the past is when he was on high doses of continual steroids that did seem to make a difference for him. 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 06/07/2019 upon evaluation today patient unfortunately appears to be doing worse yet again with regard to his wound. In fact I am starting to question whether or not he may have a fluid pocket in the muscle at this point based on the bulging and the soft appearance to the central portion of the muscle area. There is not anything draining from the muscle itself at this time which is good news but nonetheless the wound is expanding. I am not really seeing any results of  the Humira as far as overall wound progression based on what I am seeing at this point. The patient has been referred for second opinion with regard to his wound to the [facility] wound care center by his dermatologist which I definitely am not in opposition to. Unfortunately we tried multiple dressings in the past including collagen, alginate, and at one point even Hydrofera Blue. With that being said he is never really used it for any significant amount of time due to the fact that he often complains of pain associated with these dressings and then will go back to either using the Santyl which she has done intermittently or more frequently the triamcinolone. He is also using his own compression stockings. We have wrapped him in the past but again that was something else that he really was not a big fan of. 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 He seemed  to do much better when he was on the steroids although honestly I understand that the steroids are not necessarily the best medication to be on long-term obviously 06/14/2019 on evaluation today patient appears to be doing actually a little bit better with regard to the overall appearance with his leg. Unfortunately he does continue to have issues with what appears to be some fluid underneath the muscle although he did see the wound specialty center at [facility] last week their main goals were to see about infusion therapy in place of the Humira as they feel like that is not quite strong enough. They also recommended that we continue with the treatment otherwise as we are they felt like that was appropriate and they are okay with him  continuing to follow-up here with [place] in that regard. With that being said they are also sending him to the vein specialist there to see about vein stripping and if that would be of benefit for him. Subsequently they also did not really address whether or not an ultrasound of the muscle area to see if there is anything that needs to be addressed here would be appropriate or not. For that reason I discussed this with him last week I think we may proceed down that road at this point. 06/21/2019 upon evaluation today patient's wound actually appears to be doing slightly better compared to previous evaluations. I do believe that he has made a difference with regard to the progression here with the use of oral steroids. Again in the past has been the only thing that is really calm things down. He does tell me that from [facility] is gotten a good news from there that there are no further vein stripping that is necessary at this point. I do not have that available for review today although the patient did relay this to me. He also did obtain and have the ultrasound of the wound completed which I did sign off on today. It does appear that there is no fluid collection under the muscle this is likely then just edematous tissue in general. That is also good news. Overall I still believe the inflammation is the main issue here. He did inquire about the possibility of a wound VAC again with the muscle protruding like it is I am not really sure whether the wound VAC is necessarily ideal or not. That is something we will have to consider although I do believe he may need compression wrapping to try to help with edema control which could potentially be of benefit. 06/28/2019 on evaluation today patient appears to be doing slightly better measurement wise although this is not terribly smaller he least seems to be trending towards that direction. With that being said he still seems to have purulent drainage noted in the wound bed  at this time. He has been on Levaquin followed by Cipro over the past month. Unfortunately he still seems to have some issues with active infection at this time. I did perform a culture last week in order to evaluate and see if indeed there was still anything going on. Subsequently the culture did come back showing Pseudomonas which is consistent with the drainage has been having which is blue-green in color. He also has had an odor that again was somewhat consistent with Pseudomonas as well. Long story short it appears that the culture showed an intermediate finding with regard to how well the Cipro will work for the Pseudomonas infection. Subsequently being that he does not seem  to be clearing up and at best what we are doing is just keeping this at [place] I think he may need to see infectious disease to discuss IV antibiotic options. MECCA, BARGA ([id number]) 07/05/2019 upon evaluation today patient appears to be doing okay in regard to his leg ulcer. He has been tolerating the dressing changes at this point without complication. Fortunately there is no signs of active infection at this time which is good news. No fevers, chills, nausea, vomiting, or diarrhea. With that being said he does have an appointment with infectious disease tomorrow and his primary care on Wednesday. Again the reason for the infectious disease referral was due to the fact that he did not seem to be fully resolving with the use of oral antibiotics and therefore we were thinking that IV antibiotic therapy may be necessary secondary to the fact that there was an intermediate finding for how effective the Cipro may be. Nonetheless again he has been having a lot of purulent and even green drainage. Fortunately right now that seems to have calmed down over the past week with the reinitiation of the oral antibiotic. Nonetheless we will see what Dr. Megan Salon has to say. 07/12/2019 upon evaluation today patient appears to be doing about  the same at this point in regard to his left lower extremity ulcer. Fortunately there is no signs of active infection at this time which is good news I do believe the Levaquin has been beneficial I did review Dr. Hale Bogus note and to be honest I agree that the patient's leg does appear to be doing better currently. What we found in the past as he does not seem to really completely resolve he will stop the antibiotic and then subsequently things will revert back to having issues with blue-green drainage, increased pain, and overall worsening in general. Obviously that is the reason I sent him back to infectious disease. 07/19/2019 upon evaluation today patient appears to be doing roughly the same in size there is really no dramatic improvement. He has started back on the Levaquin at this point and though he seems to be doing okay he did still have a lot of blue/green drainage noted on evaluation today unfortunately. I think that this is still indicative more likely of a Pseudomonas infection as previously noted and again he does see Dr. Megan Salon in just a couple of days. I do not know that were really able to effectively clear this with just oral antibiotics alone based on what I am seeing currently. Nonetheless we are still continue to try to manage as best we can with regard to the patient and his wound. I do think the wrap was helpful in decreasing the edema which is excellent news. No fevers, chills, nausea, vomiting, or diarrhea. 07/26/2019 upon evaluation today patient appears to be doing slightly better with regard to the overall appearance of the muscle there is no dark discoloration centrally. Fortunately there is no signs of active infection at this time. No fevers, chills, nausea, vomiting, or diarrhea. Patient's wound bed currently the patient did have an appointment with Dr. Megan Salon at [facility] last week. With that being said Dr. Megan Salon the patient states was still somewhat  hesitant about put him on any IV antibiotics he wanted [place] to repeat cultures today and then see where things go going forward. He does look like Dr. Megan Salon because of some improvement the patient did have with the Levaquin wanted [place] to see about repeating cultures. If it indeed grows  the Pseudomonas again then he recommended a possibility of considering a PICC line placement and IV antibiotic therapy. He plans to see the patient back in 1 to 2 weeks. 08/02/2019 upon evaluation today patient appears to be doing poorly with regard to his left lower extremity. We did get the results of his culture back it shows that he is still showing evidence of Pseudomonas which is consistent with the purulent/blue-green drainage that he has currently. Subsequently the culture also shows that he now is showing resistance to the oral fluoroquinolones which is unfortunate as that was really the only thing to treat the infection prior. I do believe that he is looking like this is going require IV antibiotic therapy to get this under control. Fortunately there is no signs of systemic infection at this time which is good news. The patient does see Dr. Megan Salon tomorrow. 08/09/2019 upon evaluation today patient appears to be doing better with regard to his left lower extremity ulcer in regard to the overall appearance. He is currently on IV antibiotic therapy. As ordered by Dr. Megan Salon. Currently the patient is on ceftazidime which she is going to take for the next 2 weeks and then follow-up for 4 to 5-week appointment with Dr. Megan Salon. The patient started this this past Friday symptoms have not for a total of 3 days currently in full. 08/16/2019 upon evaluation today patient's wound actually does show muscle in the base of the wound but in general does appear to be much better as far as the overall evidence of infection is concerned. In fact I feel like this is for the most part cleared up he still on the IV antibiotics  he has not completed the full course yet but I think he is doing much better which is excellent news. 08/23/2019 upon evaluation today patient appears to be doing about the same with regard to his wound at this point. He tells me that he still has pain unfortunately. Fortunately there is no evidence of systemic infection at this time which is great news. There is significant muscle protrusion. 09/13/19 upon evaluation today patient appears to be doing about the same in regard to his leg unfortunately. He still has a lot of drainage coming from the ulceration there is still muscle exposed. With that being said the patient's last wound culture still showed an intermediate finding with regard to the Pseudomonas he still having the bluish/green drainage as well. Overall I do not know that the wound has completely cleared of infection at this point. Fortunately there is no signs of active infection systemically at this point which is good news. 09/20/2019 upon evaluation today patient's wound actually appears to be doing about the same based on what I am seeing currently. I do not see any signs of systemic infection he still does have evidence of some local infection and drainage. He did see Dr. Megan Salon last week and Dr. Megan Salon states that he probably does need a different IV antibiotic although he does not want to put him on this until the patient begins the Remicade infusion which is actually scheduled for about 10 days out from today on 13 May. Following that time Dr. Megan Salon is good to see him back and then will evaluate the feasibility of starting him on the IV antibiotic therapy once again at that point. I do not disagree with this plan I do believe as Dr. Megan Salon stated in his note that I reviewed today that the patient's issue is multifactorial with the  pyoderma being 1 aspect of this that were hoping the Remicade will be helpful for her. In the meantime I think the gentamicin is, helping to keep  things under decent okay control in regard to the ulcer. 09/27/2019 upon evaluation today patient appears to be doing about the same with regard to his wound still there is a lot of muscle exposure though he does have some hyper granulation tissue noted around the edge and actually some granulation tissue starting to form over the muscle which is actually good news. Fortunately there is no evidence of active infection which is also good news. His pain is less at this point. 5/21; this is a patient I have not seen in a long time. 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 He has a large wound on the left lateral leg with protruding muscle. He comes in the clinic today showing the same area on his left medial ankle. He says there is been a spot there for some time although we have not previously defined this. Today he has a clearly defined area with slight amount of skin breakdown surrounded by raised areas with a purplish hue in color. This is not painful he says it is irritated. 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 He still has muscle protruding from the lateral portion of his left leg but fortunately the new area that they were concerned about at his last visit does not appear to have opened at this point. He is currently on Remicade infusions and seems to be doing better in my opinion in fact the wound itself seems to be overall much better. The purplish discoloration that he did have seems to have resolved and I think that is a good sign that hopefully the Remicade is doing its job. He does have some biofilm noted over the surface of the wound. 11/01/2019 on evaluation today patient's wound actually appears to be doing excellent at this time. Fortunately there is no evidence of active infection and overall I feel like he is making great progress. The  Remicade seems to be due excellent job in my opinion. LIJAH, BOURQUE ([id number]) 11/08/19 evaluation today vision actually appears to be doing quite well with regard to his weight ulcer. He's been tolerating dressing changes without complication. Fortunately there is no evidence of infection. No fevers, chills, nausea, or vomiting noted at this time. Overall states that is having more itching than pain which is actually a good sign in my opinion. 12/13/2019 upon evaluation today patient appears to be doing well today with regard to his wound. He has been tolerating the dressing changes without complication. Fortunately there is no sign of active infection at this time. No fevers, chills, nausea, vomiting, or diarrhea. Overall I feel like the infusion therapy has been very beneficial for him. 01/06/2020 on evaluation today patient appears to be doing well with regard to his wound. This is measuring smaller and actually looks to be doing better. Fortunately there is no signs of active infection at this point. No fevers, chills, nausea, vomiting, or diarrhea. With that being said he does still have the blue-green drainage but this does not seem to be causing any significant issues currently. He has been using the gentamicin that does seem to be keeping things under decent control at this point. He goes later this morning for his next infusion therapy for the pyoderma which seems  to also be very beneficial. 02/07/2020 on evaluation today patient appears to be doing about the same in regard to his wounds currently. Fortunately there is no signs of active infection systemically he does still have evidence of local infection still using gentamicin. He also is showing some signs of improvement albeit slowly I do feel like we are making some progress here. 02/21/2020 upon evaluation today patient appears to be making some signs of improvement the wound is measuring a little bit smaller which is great news and  overall I am very pleased with where he stands currently. He is going to be having infusion therapy treatment on the 15th of this month. Fortunately there is no signs of active infection at this time. 03/13/2020 I do believe patient's wound is actually showing some signs of improvement here which is great news. He has continue with the infusion therapy through rheumatology/dermatology at [name]. That does seem to be beneficial. I still think he gets as much benefit from this as he did from the prednisone initially but nonetheless obviously this is less harsh on his body that the prednisone as far as they are concerned. 03/31/2020 on evaluation today patient's wound actually showing signs of some pretty good improvement in regard to the overall appearance of the wound bed. There is still muscle exposed though he does have some epithelial growth around the edges of the wound. Fortunately there is no signs of active infection at this time. No fevers, chills, nausea, vomiting, or diarrhea. 04/24/2020 upon evaluation today patient appears to be doing about the same in regard to his leg ulcer. He has been tolerating the dressing changes without complication. Fortunately there is no signs of active infection at this time. No fevers, chills, nausea, vomiting, or diarrhea. With that being said he still has a lot of irritation from the bandaging around the edges of the wound. We did discuss today the possibility of a referral to plastic surgery. 05/22/2020 on evaluation today patient appears to be doing well with regard to his wounds all things considered. He has not been able to get the Chantix apparently there is a recall nurse that I was unaware of put out by [employer] involuntarily. Nonetheless for now I am and I have to do some research into what may be the best option for him to help with quitting in regard to smoking and we discussed that today. 06/26/2020 upon evaluation today patient appears to be doing well with  regard to his wound from the standpoint of infection I do not see any signs of infection at this point. With that being said unfortunately he is still continuing to have issues with muscle exposure and again he is not having a whole lot of new skin growth unfortunately. There does not appear to be any signs of active infection at this time. No fevers, chills, nausea, vomiting, or diarrhea. 07/10/2020 upon evaluation today patient appears to be doing a little bit more poorly currently compared to where he was previous. I am concerned currently about an active infection that may be getting worse especially in light of the increased size and tenderness of the wound bed. No fevers, chills, nausea, vomiting, or diarrhea. 07/24/2020 upon evaluation today patient appears to be doing poorly in regard to his leg ulcer. He has been tolerating the dressing changes without complication but unfortunately is having a lot of discomfort. 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 Subsequently I think he is  going require possibly IV antibiotics to get this under control. I am very concerned about the severity of his infection and the amount of discomfort he is having. 07/31/2020 upon evaluation today patient appears to be doing about the same in regard to his leg wound. He did see Dr. Megan Salon and Dr. Megan Salon is actually going to start him on IV antibiotics. He goes for the PICC line tomorrow. With that being said there do not have that run for 2 weeks and then see how things are doing and depending on how he is progressing they may extend that a little longer. Nonetheless I am glad this is getting ready to be in place and definitely feel it may help the patient. In the meantime is been using mainly triamcinolone to the wound bed has an anti-inflammatory. 08/07/2020 on evaluation today patient appears to be doing well with regard to his wound compared  even last week. In the interim he has gotten the PICC line placed and overall this seems to be doing excellent. There does not appear to be any evidence of infection which is great news systemically although locally of course has had the infection this appears to be improving with the use of the antibiotics. 08/14/2020 upon evaluation today patient's wound actually showing signs of excellent improvement. Overall the irritation has significantly improved the drainage is back down to more of a normal level and his pain is really pretty much nonexistent compared to what it was. Obviously I think that this is significantly improved secondary to the IV antibiotic therapy which has made all the difference in the world. Again he had a resistant form of Pseudomonas for which oral antibiotics just was not cutting it. Nonetheless I do think that still we need to consider the possibility of a surgical closure for this wound is been open so long and to be honest with muscle exposed I think this can be very hard to get this to close outside of this although definitely were still working to try to do what we can in that regard. 08/21/2020 upon evaluation today patient appears to be doing very well with regard to his wounds on the left lateral lower extremity/calf area. Fortunately there does not appear to be signs of active infection which is great news and overall very pleased with where things stand today. He is actually wrapping up his treatment with IV antibiotics tomorrow. After that we will see where things go from there. 08/28/2020 upon evaluation today patient appears to be doing decently well with regard to his leg ulcer. There does not appear to be any signs of active infection which is great news and overall very pleased with where things stand today. No fevers, chills, nausea, vomiting, or diarrhea. 09/18/2020 upon evaluation today patient appears to be doing well with regard to his infection which I feel like  is better. Unfortunately he is not doing as well with regard to the overall size of the wound which is not nearly as good at this point. I feel like that he may be having an issue here with the pyoderma being somewhat out of control. I think that he may benefit from potentially going back and talking to the dermatologist about EITHEN, CASTIGLIA ([id number]) what to do from the pyoderma standpoint. I am not certain if the infusions are helping nearly as much is what the prednisone did in the past. 10/02/2020 upon evaluation today patient appears to be doing well with regard to his leg ulcer. He did  go to the [facility]iatric nurse. Unfortunately they feel like there is a 10% chance that most that he would be able to heal and that the skin graft would take. Obviously this has led him to not be able to go down that path as far as treatment is concerned. Nonetheless he does seem to be doing a little bit better with the prednisone that I gave him last time. I think that he may need to discuss with dermatology the possibility of long-term prednisone as that seems to be what is most helpful for him to be perfectly honest. I am not sure the Remicade is really doing the job. 10/17/2020 upon evaluation today patient appears to be doing a little better in regard to his wound. In fact the case has been since we did the prednisone on May 2 for him that we have noticed a little bit of improvement each time we have seen a size wise as well as appearance wise as well as pain wise. I think the prednisone has had a greater effect then the infusion therapy has to be perfectly honest. With that being said the patient also feels significantly better compared to what he was previous. All of this is good news but nonetheless I am still concerned about the fact that again we are really not set up to long-term manage him as far as prednisone is concerned. Obviously there are things that you need to be watched I completely understand the  risk of prednisone usage as well. 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 With all that being said I do believe that we can give him another round of the prednisone which she is requesting today because of the improvement that he seen since we did that first round. 10/30/2020 upon evaluation today patient's wound actually is showing signs of doing quite well. There does not appear to be any evidence of infection which is great news and overall very pleased with where things stand today. No fevers, chills, nausea, vomiting, or diarrhea. He tells me that the prednisone still has seem to have helped he wonders if we can extend that for just a little bit longer. He did not have the appointment with a dermatologist although he did have an infusion appointment last Friday. That was at [facility] With that being said he tells me he could not do both that as well as the appointment with the physician on the same day therefore that is can have to be rescheduled. I really want to see if there is anything they feel like that could be done differently to try to help this out as I am not really certain that the infusions are helping significantly here. 11/13/2020 upon evaluation today patient unfortunately appears to be doing somewhat poorly in regard to his wound I feel like this is actually worsening from the standpoint of the pyoderma spreading. I still feel like that he may need something different as far as trying to manage this going forward. Again we did the prednisone unfortunately his blood sugars are not doing so well because of this. Nonetheless I believe that the patient likely needs to try topical steroid. We have done triamcinolone for a while I think going with something stronger such as clobetasol could be beneficial again this is not something I do lightly I discussed this with the patient that again this does not normally put underneath an  occlusive dressing. Nonetheless I think a  thin film as such could help with some of the stronger anti-inflammatory effects. We discussed this today. He would like to try to give this a trial for the next couple weeks. I definitely think that is something that we can do. Evaluate7/03/2021 and today patient's wound bed actually showed signs of doing really about the same. There was a little expansion of the size of the wound and that leading edge that we done looking out although the clobetasol does seem to have slowed this down a bit in my opinion. There is just 1 small area that still seems to be progressing based on what I see. Nonetheless I am concerned about the fact this does not seem to be improving if anything seems to be doing a little bit worse. I do not know that the infusions are really helping him much as next infusion is August 5 his appointment with dermatology is July 25. Either way I really think that we need to have a conversation potentially about this and I am actually going to see if I can talk with Dr. Lillia Carmel in order to see where things stand as well. 12/11/2020 upon evaluation today patient appears to be doing worse in regard to his leg ulcer. Unfortunately I just do not think this is making the progress that I would like to see at this point. Honestly he does have an appointment with dermatology and this is in 2 days. I am wondering what they may have to offer to help with this. Right now what I am seeing is that he is continuing to show signs of worsening little by little. Obviously that is not great at all. Is the exact opposite of what we are looking for. 12/18/2020 upon evaluation today patient appears to be doing a little better in regard to his wound. The dermatologist actually did do some steroid injections into the wound which does seem to have been beneficial in my opinion. That was on the 25th already this looks a little better to me than last time I saw him. With  that being said we did do a culture and this did show that he has Staph aureus noted in abundance in the wound. With that being said I do think that getting him on an oral antibiotic would be appropriate as well. Also think we can compression wrap and this will make a difference as well. 12/28/2020 upon evaluation today patient's wound is actually showing signs of doing much better. I do believe the compression wrap is helping he has a lot of drainage but to be honest I think that the compression is helping to some degree in this regard as well as not draining through which is also good news. No fevers, chills, nausea, vomiting, or diarrhea. 01/04/2021 upon evaluation today patient appears to be doing well with regard to his wound. Overall things seem to be doing quite well. He did have a little bit of reaction to the CarboFlex Sorbact he will be using that any longer. With that being said he is controlled as far as the drainage is concerned overall and seems to be doing quite well. I do not see any signs of active infection at this time which is great news. No fevers, chills, nausea, vomiting, or diarrhea. 01/11/2021 upon evaluation today patient appears to be doing well with regard to his wounds. He has been tolerating the dressing changes without complication. Fortunately there does not appear to be any signs of active infection at this time which  is great news. Overall I am extremely pleased with where we stand currently. No fevers, chills, nausea, vomiting, or diarrhea. Where using clobetasol in the wound bed he has a lot of new skin growth which is awesome as well. 01/18/2021 upon evaluation today patient appears to be doing very well in regard to his leg ulcer. He has been tolerating the dressing changes without complication. Fortunately there does not appear to be any signs of active infection which is great news. In general I think that he is making excellent progress 01/25/2021 upon evaluation  today patient appears to be doing well with regard to his wound on the leg. I am actually extremely pleased with where things stand today. There does not appear to be any signs of active infection which is great news and overall I think that we are definitely headed in the appropriate direction based on what I am seeing currently. There does not appear to be any signs of active infection also excellent news. 02/06/2021 upon evaluation today patient appears to be doing well with regard to his wound. Overall visually this is showing signs of significant improvement which is great news. I do not see any signs of active infection systemically which is great even locally I do not think that we are seeing any major complications here. We did do fluorescence imaging with the [employer] today. The patient does have some odor and drainage noted and again this is something that I think would benefit him to probably come more frequently for nurse visits. 02/19/2021 upon evaluation today patient actually appears to be doing quite well in regard to his wound. He has been tolerating the dressing Mccreadie, Friend ([id number]) changes without complication and overall I think that this is making excellent progress. I do not see any evidence of active infection at this point which is great news as well. No fevers, chills, nausea, vomiting, or diarrhea. 10/10; wound is made nice progress healthy granulation with a nice rim of epithelialization which seems to be expanding even from last week he has a deeper area in the inferior part of the more distal part of the wound with not quite as healthy as surface. This area will need to be followed. Using clobetasol and Hydrofera Blue 03/05/2021 upon evaluation today patient appears to be doing very well in regard to his leg ulcer. He has been tolerating dressing changes without complication. Fortunately there does not appear to be any signs of active infection which is great news  and overall I am extremely pleased with where we stand currently. 03/12/2021 upon evaluation today patient appears to be doing well with regard to his wound in fact this is extremely extremely good based on what we are seeing today there does not appear to be any signs of active infection and overall I think that he is doing awesome from the standpoint of healing in general. I am extremely pleased with how things seem to be progressing with regard to this pyoderma. Clobetasol has done wonders for him. I think the compression wrapping has also been of great benefit. 03/19/2021 upon evaluation today patient appears to be doing well with regard to his wound. He is tolerating the dressing changes without complication. In fact I feel like that he is actually making excellent progress at this point based on what I am seeing. No fevers, chills, nausea, vomiting, or diarrhea. 03/26/2021 upon evaluation today patient appears to be doing well with regard to his wound. This again is measuring smaller and  looking better. Again the progress is slow but nonetheless continual with what we have been seeing. I do believe that the current plan is doing awesome for him. 04/09/2021 upon evaluation today patient appears to be doing well with regard to his leg ulcer. This is showing signs of excellent improvement the muscle is completely closed over and there does not appear to be any evidence of inflammation at this point his drainage is significantly improved. Overall I think that he would be a good candidate for looking into a skin substitute at this point as well. We will get a look into some approvals in that regard. Potentially TheraSkin as well as Apligraf could both be considered just depending on insurance coverage. 04/16/2021 upon evaluation today patient appears to be doing well at this point. He has been approved for the Apligraf which we could definitely order although I would like to try to get the TheraSkin  approved if at all possible. I did fax notes into them today and I Georgina Peer try to give a call as well. Overall the wound appears to be doing decently well today. 04/20/2021 upon evaluation today patient actually appears to be doing quite well in regard to his wound with that being said we are trying to see what we can do about speeding up the healing process. For that reason we did discuss the possibility of a skin substitute. We got the Apligraf approved. For that reason we will get a go ahead and see what we can do with the Apligraf at this point. I am still trying to get the TheraSkin approved but I have not heard anything from the insurance company yet I have called and talked to them earlier in the week and to be honest this was about half an hour that I spent on the phone they told me I would hear something within 10-15 business days. 04/27/2021 upon evaluation today patient appears to be doing excellent in regard to his wound. I do believe the Apligraf has been beneficial. With that being said he has had a little bit of increased pain has been a little bit concerned. I do want to go ahead and apply his steroid cream today the clobetasol and then put the Apligraf over I just do not think I want to risk not putting the clobetasol on based on what is been going on here currently. Patient voiced understanding. 05/04/2021 upon evaluation today patient is making excellent improvement. Overall there is definitely decrease in the size of the wound today and I am very pleased in that regard. I do not see any signs of active infection locally nor systemically at this point. No fevers, chills, nausea, vomiting, or diarrhea. 05/11/2021 upon evaluation today patient appears to be doing well with regard to his wound. He has been tolerating Apligraf's without complication and is making excellent progress this is application #4 today. Electronic Signature(s) Signed: 05/11/2021 9:08:00 AM By: Worthy Keeler  PA-C Entered By: Worthy Keeler on 05/11/2021 09:08:00 ERVAN, HEBER ([id number]) -------------------------------------------------------------------------------- Physical Exam Details Patient Name: [name] Date of Service: 05/11/2021 8:00 AM Medical Record Number: [id number] Patient Account Number: [id number] Date of Birth/Sex: 11-Jan-1981 (40 y.o. M) Treating RN: [name] Primary Care Provider: [name] Other Clinician: Referring Provider: [name] Treating Provider/Extender: [name] Cliche in Treatment: 81 Constitutional Obese and well-hydrated in no acute distress. Respiratory normal breathing without difficulty. Psychiatric this patient is able to make decisions and demonstrates good insight into disease process. Alert and Oriented  x 3. pleasant and cooperative. Notes Patient's wound did not require any sharp debridement I did mechanically clear away some of the slough and biofilm which came off really easily just with saline and gauze lightly fortunately he is doing awesome and I really feel like he is very close to getting this closed I think the Apligraf is brought this a tremendous way. Electronic Signature(s) Signed: 05/11/2021 9:08:46 AM By: Worthy Keeler PA-C Entered By: Worthy Keeler on 05/11/2021 09:08:46 DARRILL, VREELAND ([id number]) -------------------------------------------------------------------------------- Physician Orders Details Patient Name: [name] Date of Service: 05/11/2021 8:00 AM Medical Record Number: [id number] Patient Account Number: [id number] Date of Birth/Sex: 1981/03/01 (40 y.o. M) Treating RN: [name] Primary Care Provider: [name] Other Clinician: Referring Provider: [name] Treating Provider/Extender: [name] Cliche in Treatment: [phone] Verbal / Phone Orders: No Diagnosis Coding ICD-10 Coding Code Description I87.2 Venous insufficiency (chronic) (peripheral) L97.222 Non-pressure chronic  ulcer of left calf with fat layer exposed E11.622 Type 2 diabetes mellitus with other skin ulcer L88 Pyoderma gangrenosum F17.208 Nicotine dependence, unspecified, with other nicotine-induced disorders Follow-up Appointments o Return Appointment in 1 week. o Nurse Visit as needed - twice a week Bathing/ Shower/ Hygiene o Clean wound with Normal Saline or wound cleanser. Cellular or Tissue Based Products o Cellular or Tissue Based Product Type: - APLIGRAF #4 o Cellular or Tissue Based Product applied to wound bed; including contact layer, fixation with steri-strips, dry gauze and cover dressing. (DO NOT REMOVE). Edema Control - Lymphedema / Segmental Compressive Device / Other o Optional: One layer of unna paste to top of compression wrap (to act as an anchor). o Elevate, Exercise Daily and Avoid Standing for Long Periods of Time. o Elevate legs to the level of the heart and pump ankles as often as possible o Elevate leg(s) parallel to the floor when sitting. Wound Treatment Wound #1 - Lower Leg Wound Laterality: Left, Lateral Cleanser: Wound Cleanser 3 x Per Week/30 Days Discharge Instructions: Wash your hands with soap and water. Remove old dressing, discard into plastic bag and place into trash. Cleanse the wound with Wound Cleanser prior to applying a clean dressing using gauze sponges, not tissues or cotton balls. Do not scrub or use excessive force. Pat dry using gauze sponges, not tissue or cotton balls. Peri-Wound Care: Moisturizing Lotion 3 x Per Week/30 Days Discharge Instructions: Suggestions: Theraderm, Eucerin, Cetaphil, or patient preference. Secondary Dressing: Xtrasorb Medium 4x5 (in/in) 3 x Per Week/30 Days Discharge Instructions: Apply to wound as directed. Do not cut. Compression Wrap: Medichoice 4 layer Compression System, 35-40 mmHG (Generic) 3 x Per Week/30 Days Discharge Instructions: Apply multi-layer wrap as directed. Electronic  Signature(s) Signed: 05/11/2021 12:10:22 PM By: Worthy Keeler PA-C Signed: 05/11/2021 12:21:29 PM By: [name] [name], RN, [name], [name], [name] Entered By: [name], [name], RN, [name], [name] on 05/11/2021 08:42:40 TRAYVION, EMBLETON ([id number]) -------------------------------------------------------------------------------- Problem List Details Patient Name: [name] Date of Service: 05/11/2021 8:00 AM Medical Record Number: [id number] Patient Account Number: [id number] Date of Birth/Sex: Dec 22, 1980 (40 y.o. M) Treating RN: [name] Primary Care Provider: [name] Other Clinician: Referring Provider: [name] Treating Provider/Extender: [name] Cliche in Treatment: [phone] Active Problems ICD-10 Encounter Code Description Active Date MDM Diagnosis I87.2 Venous insufficiency (chronic) (peripheral) 12/04/2016 No Yes L97.222 Non-pressure chronic ulcer of left calf with fat layer exposed 12/04/2016 No Yes E11.622 Type 2 diabetes mellitus with other skin ulcer 04/09/2021 No Yes L88 Pyoderma gangrenosum 03/26/2017 No Yes F17.208 Nicotine dependence, unspecified, with other  nicotine-induced disorders 04/24/2020 No Yes Inactive Problems ICD-10 Code Description Active Date Inactive Date L97.213 Non-pressure chronic ulcer of right calf with necrosis of muscle 04/02/2017 04/02/2017 Resolved Problems ICD-10 Code Description Active Date Resolved Date L97.321 Non-pressure chronic ulcer of left ankle limited to breakdown of skin 10/08/2019 10/08/2019 L03.116 Cellulitis of left lower limb 05/24/2019 05/24/2019 Electronic Signature(s) Signed: 05/11/2021 8:12:13 AM By: Worthy Keeler PA-C Entered By: Worthy Keeler on 05/11/2021 08:12:12 RACHIT, GRIM ([id number]) -------------------------------------------------------------------------------- Progress Note Details Patient Name: [name] Date of Service: 05/11/2021 8:00 AM Medical Record Number: [id number] Patient Account Number: [id number] Date of  Birth/Sex: 1981/03/05 (40 y.o. M) Treating RN: [name] Primary Care Provider: [name] Other Clinician: Referring Provider: [name] Treating Provider/Extender: [name] Cliche in Treatment: 37 Subjective Chief Complaint Information obtained from Patient He is here in follow up evaluation for LLE pyoderma ulcer History of Present Illness (HPI) 12/04/16; 40 year old man who comes into the clinic today for review of a wound on the posterior left calf. He tells me that is been there for about a year. He is not a diabetic he does smoke half a pack per day. He was seen in the ER on 11/20/16 felt to have cellulitis around the wound and was given clindamycin. An x-ray did not show osteomyelitis. The patient initially tells me that he has a milk allergy that sets off a pruritic itching rash on his lower legs which she scratches incessantly and he thinks that's what may have set up the wound. He has been using various topical antibiotics and ointments without any effect. He works in a [employer]ing [employer] and is on his feet all day. He does not have a prior history of wounds however he does have the rash on both lower legs the right arm and the ventral aspect of his left arm. These are excoriations and clearly have had scratching however there are of macular looking areas on both legs including a substantial larger area on the right leg. This does not have an underlying open area. There is no blistering. The patient tells me that 2 years ago in [place] in response to the rash on his legs he saw a dermatologist who told him he had a condition which may be pyoderma gangrenosum although I may be putting words into his mouth. He seemed to recognize this. On further questioning he admits to a 5 year history of quiesced. ulcerative colitis. He is not in any treatment for this. He's had no recent travel 12/11/16; the patient arrives today with his wound and roughly the same condition we've been using  silver alginate this is a deep punched out wound with some surrounding erythema but no tenderness. Biopsy I did did not show confirmed pyoderma gangrenosum suggested nonspecific inflammation and vasculitis but does not provide an actual description of what was seen by the pathologist. I'm really not able to understand this We have also received information from the patient's dermatologist in [place] notes from April 2016. This was a doctor Agarwal-antal. 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 He was prescribed topical steroid high potency under occlusion which helped but at this point the patient did not have a deep punched out wound. 12/18/16; the patient's wound is larger in terms of surface area however this surface looks better and there is less depth. The surrounding erythema also is better. The patient states that the wrap we put on came off 2 days ago when he has been using his compression  stockings. He we are in the process of getting a dermatology consult. 12/26/16 on evaluation today patient's left lower extremity wound shows evidence of infection with surrounding erythema noted. He has been tolerating the dressing changes but states that he has noted more discomfort. There is a larger area of erythema surrounding the wound. No fevers, chills, nausea, or vomiting noted at this time. With that being said the wound still does have slough covering the surface. He is not allergic to any medication that he is aware of at this point. In regard to his right lower extremity he had several regions that are erythematous and pruritic he wonders if there's anything we can do to help that. 01/02/17 I reviewed patient's wound culture which was obtained his visit last week. He was placed on doxycycline at that point. Unfortunately that does not appear to be an antibiotic that would likely help with the situation however the pseudomonas noted on culture is sensitive to Cipro. Also unfortunately  patient's wound seems to have a large compared to last week's evaluation. Not severely so but there are definitely increased measurements in general. He is continuing to have discomfort as well he writes this to be a seven out of 10. In fact he would prefer me not to perform any debridement today due to the fact that he is having discomfort and considering he has an active infection on the little reluctant to do so anyway. No fevers, chills, nausea, or vomiting noted at this time. 01/08/17; patient seems dermatology on September 5. I suspect dermatology will want the slides from the biopsy I did sent to their pathologist. I'm not sure if there is a way we can expedite that. In any case the culture I did before I left on vacation 3 weeks ago showed Pseudomonas he was given 10 days of Cipro and per her description of her intake nurses is actually somewhat better this week although the wound is quite a bit bigger than I remember the last time I saw this. He still has 3 more days of Cipro 01/21/17; dermatology appointment tomorrow. He has completed the ciprofloxacin for Pseudomonas. Surface of the wound looks better however he is had some deterioration in the lesions on his right leg. Meantime the left lateral leg wound we will continue with sample 01/29/17; patient had his dermatology appointment but I can't yet see that note. He is completed his antibiotics. The wound is more superficial but considerably larger in circumferential area than when he came in. This is in his left lateral calf. He also has swollen erythematous areas with superficial wounds on the right leg and small papular areas on both arms. There apparently areas in her his upper thighs and buttocks I did not look at those. Dermatology biopsied the right leg. Hopefully will have their input next week. 02/05/17; patient went back to see his dermatologist who told him that he had a "scratching problem" as well as staph. He is now on a 30  day course of doxycycline and I believe she gave him triamcinolone cream to the right leg areas to help with the itching [not exactly sure but probably triamcinolone]. She apparently looked at the left lateral leg wound although this was not rebiopsied and I think felt to be ultimately part of the same pathogenesis. He is using sample border foam and changing nevus himself. He now has a new open area on the right posterior leg which was his biopsy site I don't have any of the dermatology  notes 02/12/17; we put the patient in compression last week with SANTYL to the wound on the left leg and the biopsy. Edema is much better and the depth of the wound is now at level of skin. Area is still the same Biopsy site on the right lateral leg we've also been using santyl with a border foam dressing and he is changing this himself. 02/19/17; Using silver alginate started last week to both the substantial left leg wound and the biopsy site on the right wound. He is tolerating compression well. Has a an appointment with his primary M.D. tomorrow wondering about diuretics although I'm wondering if the edema problem is actually lymphedema DAYMON, HORA ([id number]) 02/26/17; the patient has been to see his primary doctor Dr. Jerrel Ivory at [facility] our primary care. She started him on Lasix 20 mg and this seems to have helped with the edema. However we are not making substantial change with the left lateral calf wound and inflammation. The biopsy site on the right leg also looks stable but not really all that different. 03/12/17; the patient has been to see vein and vascular Dr. Lucky Cowboy. He has had venous reflux studies I have not reviewed these. I did get a call from his dermatology office. They felt that he might have pathergy based on their biopsy on his right leg which led them to look at the slides of the biopsy I did on the left leg and they wonder whether this represents pyoderma gangrenosum which was the  original supposition in a man with ulcerative colitis albeit inactive for many years. They therefore recommended clobetasol and tetracycline i.e. aggressive treatment for possible pyoderma gangrenosum. 03/26/17; apparently the patient just had reflux studies not an appointment with Dr. dew. She arrives in clinic today having applied clobetasol for 2-3 weeks. He notes over the last 2-3 days excessive drainage having to change the dressing 3-4 times a day and also expanding erythema. 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 Lillia Carmel at [facility] dermatology locally who kindly saw him at my request. 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 My concern was the degree of erythema medially extending from his left leg wound which was either inflammation from pyoderma or cellulitis. I put him on Augmentin however culture of the wound showed Pseudomonas which is quinolone sensitive. I really don't believe he has cellulitis however in view of everything I will continue and give him a course of Cipro. He is also on doxycycline as an immune modulator for the pyoderma. In addition to his original wound on the left lateral leg with surrounding erythema he has a wound on the right posterior calf which was an original biopsy site done by dermatology. 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 Saw Dr. Lillia Carmel yesterday. He has been using topical antibiotics to both wound areas his original wound on the left and the biopsies/pathergy area on the right. There is definitely some improvement in the inflammation around the wound on the right although the  patient states he has increasing sensitivity of the wounds. He is on prednisone 60 and doxycycline 1 as prescribed by Dr. Lillia Carmel. He is covering the topical antibiotic with  gauze and putting this in his own compression stocks and changing this daily. He states that Dr. Lottie Rater did a culture of the left leg wound yesterday 05/07/17; pyoderma gangrenosum. The patient saw Dr. Lillia Carmel yesterday and has a follow-up with her in one month. He is still using topical antibiotics to both wounds although he can't recall exactly what type. He is still on prednisone 60 mg. Dr. Lillia Carmel stated that the doxycycline could stop if we were in agreement. 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 The right medial leg was induced by biopsy/pathergy. The area on the right is essentially healed. 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 The right medial leg has closed and remains closed. He is still on prednisone 60. He tells me he missed his last dermatology appointment with Dr. Lillia Carmel but will make another appointment. He reports that her blood sugar at a recent screen in [place] was high 200's. He was 180 today. 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 I think he is going to require still much longer prednisone perhaps another 3 months before attempting to taper. In the meantime his wound is a lot better. Smaller. He is cleaning this off daily and applying topical antibiotics. When he was last in the clinic I thought about changing to [facility] and actually put in a couple of calls to dermatology although probably not during their business hours. In any case the wound looks better smaller I don't think there is any need to change what he is doing 08/06/17-he is here in follow up evaluation for pyoderma  left leg ulcer. He continues on oral prednisone. He has been using triple antibiotic ointment. There is surface debris and we will transition to [facility] and have him return in 2 weeks. He has lost 30 pounds since his last appointment with lifestyle modification. He may benefit from topical steroid cream for treatment this can be considered at a later date. 08/22/17 on evaluation today patient appears to actually be doing rather well in regard to his left lateral lower extremity ulcer. He has actually been managed by Dr. Dellia Nims most recently. Patient is currently on oral steroids at this time. This seems to have been of benefit for him. Nonetheless his last visit was actually with Leah on 08/06/17. Currently he is not utilizing any topical steroid creams although this could be of benefit as well. No fevers, chills, nausea, or vomiting noted at this time. 09/05/17 on evaluation today patient appears to be doing better in regard to his left lateral lower extremity ulcer. He has been tolerating the dressing changes without complication. He is using Santyl with good effect. Overall I'm very pleased with how things are standing at this point. Patient likewise is happy that this is doing better. 09/19/17 on evaluation today patient actually appears to be doing rather well in regard to his left lateral lower extremity ulcer. 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 He's not having any significant pain. 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 He has been tolerating the Santyl without complication and in general I feel like he's making good progress. 10/17/17 on evaluation today patient  appears to be doing very well in regard to his left lateral lower surety ulcer. He has been tolerating the dressing changes without complication.  There does not appear to be any evidence of infection he's alternating the Santyl and the triple antibiotic ointment every other day this seems to be doing well for him. 11/03/17 on evaluation today patient appears to be doing very well in regard to his left lateral lower extremity ulcer. He is been tolerating the dressing changes without complication which is good news. Fortunately there does not appear to be any evidence of infection which is also great news. Overall is doing excellent they are starting to taper down on the prednisone is down to 40 mg at this point it also started topical clobetasol for him. 11/17/17 on evaluation today patient appears to be doing well in regard to his left lateral lower surety ulcer. He's been tolerating the dressing changes without complication. He does note that he is having no pain, no excessive drainage or discharge, and overall he feels like things are going about how he would expect and hope they would. Overall he seems to have no evidence of infection at this time in my opinion which is good news. 12/04/17-He is seen in follow-up evaluation for right lateral lower extremity ulcer. He has been applying topical steroid cream. Today's measurement show slight increase in size. Over the next 2 weeks we will transition to every other day Santyl and steroid cream. He has been encouraged to monitor for changes and notify clinic with any concerns 12/15/17 on evaluation today patient's left lateral motion the ulcer and fortunately is doing worse again at this point. This just since last week to this week has close to doubled in size according to the patient. I did not seeing last week's I do not have a visual to compare this to in our system was also down so we do not have all the charts and at this point. Nonetheless it does have me somewhat concerned in regard to the fact that again he was worried enough about it he has contact the dermatology that placed them back on  the full strength, 50 mg a day of the prednisone that he was taken previous. He continues to alternate using clobetasol along with Santyl at this point. He is obviously somewhat frustrated. DENG, KEMLER ([id number]) 12/22/17 on evaluation today patient appears to be doing a little worse compared to last evaluation. Unfortunately the wound is a little deeper and slightly larger than the last week's evaluation. With that being said he has made some progress in regard to the irritation surrounding at this time unfortunately despite that progress that's been made he still has a significant issue going on here. 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 For that reason I discussed with him today the possibility of perform a wound culture to ensure there's nothing overtly infected. 01/06/18 on evaluation today patient's wound is larger and deeper than previously evaluated. With that being said it did appear that his wound was infected after my last evaluation with him. Subsequently I did end up prescribing a prescription for Bactrim DS which she has been taking and having no complication with. Fortunately there does not appear to be any evidence of infection at this point in time as far as anything spreading, no want to touch, and overall I feel like  things are showing signs of improvement. 01/13/18 on evaluation today patient appears to be even a little larger and deeper than last time. There still muscle exposed in the base of the wound. Nonetheless he does appear to be less erythematous I do believe inflammation is calming down also believe the infection looks like it's probably resolved at this time based on what I'm seeing. No fevers, chills, nausea, or vomiting noted at this time. 01/30/18 on evaluation today patient actually appears to visually look better  for the most part. Unfortunately those visually this looks better he does seem to potentially have what may be an abscess in the muscle that has been noted in the central portion of the wound. This is the first time that I have noted what appears to be fluctuance in the central portion of the muscle. With that being said I'm somewhat more concerned about the fact that this might indicate an abscess formation at this location. I do believe that an ultrasound would be appropriate. This is likely something we need to try to do as soon as possible. He has been switch to mupirocin ointment and he is no longer using the steroid ointment as prescribed by dermatology he sees them again next week he's been decreased from 60 to 40 mg of prednisone. 03/09/18 on evaluation today patient actually appears to be doing a little better compared to last time I saw him. There's not as much erythema surrounding the wound itself. He I did review his most recent infectious disease note which was dated 02/24/18. He saw Dr. Michel Bickers in [place]. 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 Patient is now off of antibiotics and they are continually observing this. There seems to be no change in the past two weeks in my pinion based on what the patient says and what I see today compared to what Dr. Megan Salon likely saw two weeks ago. No fevers, chills, nausea, or vomiting noted at this time. 03/23/18 on evaluation today patient's wound actually appears to be showing signs of improvement which is good news. He is currently still on the Dapsone. He is also working on tapering the prednisone to get off of this and Dr. Lottie Rater is working with him in this regard. Nonetheless overall I feel like the wound is doing well it does appear based on the infectious disease note that I reviewed from Dr. 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 I did also review the results of his ultrasound of left lower extremity which revealed there was a dentist tissue in the base of the wound without an abscess noted. 04/06/18 on evaluation today the patient's left lateral lower extremity ulcer actually appears to be doing fairly well which is excellent news. There does not appear to be any evidence of infection at this time which is also great news. Overall he still does have a significantly large ulceration although little by little he seems to be making progress. He is down to 10 mg a day of the prednisone. 04/20/18 on evaluation today patient actually appears to be doing excellent at this time in regard to his left lower extremity ulcer. He's making signs of good progress unfortunately this is taking much longer than we would really like to see but nonetheless he  is making progress. Fortunately there does not appear to be any evidence of infection at this time. No fevers, chills, nausea, or vomiting noted at this time. The patient has not been using the Santyl due to the cost he hadn't got in this field yet. He's mainly been using the antibiotic ointment topically. Subsequently he also tells me that he really has not been scrubbing in the shower I think this would be helpful again as I told him it doesn't have to be anything too aggressive to even make it believe just enough to keep it free of some of the loose slough and biofilm on the wound surface. 05/11/18 on evaluation today patient's wound appears to be making slow but sure progress in regard to the left lateral lower extremity ulcer. He is been tolerating the dressing changes without complication. Fortunately there does not appear to be any evidence of infection at this time. He is still just using triple antibiotic ointment along with clobetasol occasionally over the area. He never  got the Santyl and really does not seem to intend to in my pinion. 06/01/18 on evaluation today patient appears to be doing a little better in regard to his left lateral lower extremity ulcer. He states that overall he does not feel like he is doing as well with the Dapsone as he did with the prednisone. Nonetheless he sees his dermatologist later today and is gonna talk to them about the possibility of going back on the prednisone. Overall again I believe that the wound would be better if you would utilize Santyl but he really does not seem to be interested in going back to the [place] at this point. He has been using triple antibiotic ointment. 06/15/18 on evaluation today patient's wound actually appears to be doing about the same at this point. Fortunately there is no signs of infection at this time. He has made slight improvements although he continues to not really want to clean the wound bed at this point. He states that he just doesn't mess with it he doesn't want to cause any problems with everything else he has going on. He has been on medication, antibiotics as prescribed by his dermatologist, for a staff infection of his lower extremities which is really drying out now and looking much better he tells me. Fortunately there is no sign of overall infection. 06/29/18 on evaluation today patient appears to be doing well in regard to his left lateral lower surety ulcer all things considering. Fortunately his staff infection seems to be greatly improved compared to previous. He has no signs of infection and this is drying up quite nicely. He is still the doxycycline for this is no longer on cental, Dapsone, or any of the other medications. His dermatologist has recommended possibility of an infusion but right now he does not want to proceed with that. 07/13/18 on evaluation today patient appears to be doing about the same in regard to his left lateral lower surety ulcer. Fortunately there's no signs  of infection at this time which is great news. Unfortunately he still builds up a significant amount of Slough/biofilm of the surface of the wound he still is not really cleaning this as he should be appropriately. Again I'm able to easily with saline and gauze remove the majority of this on the surface which if you would do this at home would likely be a dramatic improvement for him as far as getting the area to improve. Nonetheless overall I still feel like  he is making progress is just very slow. I think Santyl will be of benefit for him as well. Still he has not gotten this as of this point. 07/27/18 on evaluation today patient actually appears to be doing little worse in regards of the erythema around the periwound region of the wound he also tells me that he's been having more drainage currently compared to what he was experiencing last time I saw him. He states not quite as bad as what he had because this was infected previously but nonetheless is still appears to be doing poorly. Fortunately there is no evidence of systemic infection at this point. The patient tells me that he is not going to be able to afford the Santyl. He is still waiting to hear about the infusion therapy with his dermatologist. Apparently she wants an updated colonoscopy first. FAIN, FRANCIS ([id number]) 08/10/18 on evaluation today patient appears to be doing better in regard to his left lateral lower extremity ulcer. Fortunately he is showing signs of improvement in this regard he's actually been approved for Remicade infusion's as well although this has not been scheduled as of yet. Fortunately there's no signs of active infection at this time in regard to the wound although he is having some issues with infection of the right lower extremity is been seen as dermatologist for this. Fortunately they are definitely still working with him trying to keep things under control. 09/07/18 on evaluation today patient is actually  doing rather well in regard to his left lateral lower extremity ulcer. He notes these actually having some hair grow back on his extremity which is something he has not seen in years. He also tells me that the pain is really not giving them any trouble at this time which is also good news overall she is very pleased with the progress he's using a combination of the mupirocin along with the probate is all mixed. 09/21/18 on evaluation today patient actually appears to be doing fairly well all things considered in regard to his looks from the ulcer. He's been tolerating the dressing changes without complication. Fortunately there's no signs of active infection at this time which is good news he is still on all antibiotics or prevention of the staff infection. He has been on prednisone for time although he states it is gonna contact his dermatologist and see if she put them on a short course due to some irritation that he has going on currently. Fortunately there's no evidence of any overall worsening this is going very slow I think cental would be something that would be helpful for him although he states that $50 for tube is quite expensive. He therefore is not willing to get that at this point. 10/06/18 on evaluation today patient actually appears to be doing decently well in regard to his left lateral leg ulcer. He's been tolerating the dressing changes without complication. Fortunately there's no signs of active infection at this time. Overall I'm actually rather pleased with the progress he's making although it's slow he doesn't show any signs of infection and he does seem to be making some improvement. I do believe that he may need a switch up and dressings to try to help this to heal more appropriately and quickly. 10/19/18 on evaluation today patient actually appears to be doing better in regard to his left lateral lower extremity ulcer. This is shown signs of having much less Slough buildup at this  point due to the fact he has been  using the Santyl. Obviously this is very good news. The overall size of the wound is not dramatically smaller but again the appearance is. 11/02/18 on evaluation today patient actually appears to be doing quite well in regard to his lower [place] ulcer. A lot of the skin around the ulcer is actually somewhat irritating at this point this seems to be more due to the dressing causing irritation from the adhesive that anything else. Fortunately there is no signs of active infection at this time. 11/24/18 on evaluation today patient appears to be doing a little worse in regard to his overall appearance of his lower extremity ulcer. There's more erythema and warmth around the wound unfortunately. He is currently on doxycycline which he has been on for some time. With that being said I'm not sure that seems to be helping with what appears to possibly be an acute cellulitis with regard to his left lower extremity ulcer. No fevers, chills, nausea, or vomiting noted at this time. 12/08/18 on evaluation today patient's wounds actually appears to be doing significantly better compared to his last evaluation. He has been using Santyl along with alternating tripling about appointment as well as the steroid cream seems to be doing quite well and the wound is showing signs of improvement which is excellent news. Fortunately there's no evidence of infection and in fact his culture came back negative with only normal skin flora noted. 12/21/2018 upon evaluation today patient actually appears to be doing excellent with regard to his ulcer. This is actually the best that I have seen it since have been helping to take care of him. It is both smaller as well as less slough noted on the surface of the wound and seems to be showing signs of good improvement with new skin growing from the edges. He has been using just the triamcinolone he does wonder if he can get a refill of that ointment  today. 01/04/2019 upon evaluation today patient actually appears to be doing well with regard to his left lateral lower extremity ulcer. With that being said it does not appear to be that he is doing quite as well as last time as far as progression is concerned. There does not appear to be any signs of infection or significant irritation which is good news. With that being said I do believe that he may benefit from switching to a collagen based dressing based on how clean The wound appears. 01/18/2019 on evaluation today patient actually appears to be doing well with regard to his wound on the left lower extremity. He is not made a lot of progress compared to where we were previous but nonetheless does seem to be doing okay at this time which is good news. There is no signs of active infection which is also good news. My only concern currently is I do wish we can get him into utilizing the collagen dressing his insurance would not pay for the supplies that we ordered although it appears that he may be able to order this through his supply company that he typically utilizes. This is [employer]. Nonetheless he did try to order it during the office visit today and it appears this did go through. We will see if he can get that it is a different brand but nonetheless he has collagen and I do think will be beneficial. 02/01/2019 on evaluation today patient actually appears to be doing a little worse today in regard to the overall size of his wounds. Fortunately there  is no signs of active infection at this time. That is visually. Nonetheless when this is happened before it was due to infection. For that reason were somewhat concerned about that this time as well. 02/08/2019 on evaluation today patient unfortunately appears to be doing slightly worse with regard to his wound upon evaluation today. Is measuring a little deeper and a little larger unfortunately. I am not really sure exactly what is causing this to  enlarge he actually did see his dermatologist she is going to see about initiating Humira for him. Subsequently she also did do steroid injections into the wound itself in the periphery. Nonetheless still nonetheless he seems to be getting a little bit larger he is gone back to just using the steroid cream topically which I think is appropriate. I would say hold off on the collagen for the time being is definitely a good thing to do. Based on the culture results which we finally did get the final result back regarding it shows staph as the bacteria noted again that can be a normal skin bacteria based on the fact however he is having increased drainage and worsening of the wound measurement wise I would go ahead and place him on an antibiotic today I do believe for this. 02/15/2019 on evaluation today patient actually appears to be doing somewhat better in regard to his ulcer. There is no signs of worsening at this time I did review his culture results which showed evidence of Staphylococcus aureus but not MRSA. Again this could just be more related to the normal skin bacteria although he states the drainage has slowed down quite a bit he may have had a mild infection not just colonization. And was much smaller and then since around10/04/2019 on evaluation today patient appears to be doing unfortunately worse as far as the size of the wound. I really feel like that this is steadily getting larger again it had been doing excellent right at the beginning of September we have seen a steady increase in the area of the wound it is almost 2-1/2 times the size it was on September 1. Obviously this is a bad trend this is not wanting to see. For that reason we went back to using just the topical triamcinolone cream which does seem to help with inflammation. I checked him for bacteria by way of culture and nothing showed positive there. I am considering giving him a short course of a tapering steroid  Mahamed Zalewski, Khylon ([id number]) today to see if that is can be beneficial for him. The patient is in agreement with giving that a try. 03/08/2019 on evaluation today patient appears to be doing very well in comparison to last evaluation with regard to his lower extremity ulcer. This is showing signs of less inflammation and actually measuring slightly smaller compared to last time every other week over the past month and a half he has been measuring larger larger larger. Nonetheless I do believe that the issue has been inflammation the prednisone does seem to have been beneficial for him which is good news. No fevers, chills, nausea, vomiting, or diarrhea. 03/22/2019 on evaluation today patient appears to be doing about the same with regard to his leg ulcer. He has been tolerating the dressing changes without complication. With that being said the wound seems to be mostly arrested at its current size but really is not making any progress except for when we prescribed the prednisone. He did show some signs of dropping as far  as the overall size of the wound during that interval week. Nonetheless this is something he is not on long-term at this point and unfortunately I think he is getting need either this or else the Humira which his dermatologist has discussed try to get approval for. With that being said he will be seeing his dermatologist on the 11th of this month that is November. 04/19/2019 on evaluation today patient appears to be doing really about the same the wound is measuring slightly larger compared to last time I saw him. He has not been into the office since November 2 due to the fact that he unfortunately had Covid as that his entire family. He tells me that it was rough but they did pull-through and he seems to be doing much better. Fortunately there is no signs of active infection at this time. No fevers, chills, nausea, vomiting, or diarrhea. 05/10/2019 on evaluation today patient  unfortunately appears to be doing significantly worse as compared to last time I saw him. He does tell me that he has had his first dose of Humira and actually is scheduled to get the next one in the upcoming week. With that being said he tells me also that in the past several days he has been having a lot of issues with green drainage she showed me a picture this is more blue-green in color. He is also been having issues with increased sloughy buildup and the wound does appear to be larger today. Obviously this is not the direction that we want everything to take based on the starting of his Humira. Nonetheless I think this is definitely a result of likely infection and to be honest I think this is probably Pseudomonas causing the infection based on what I am seeing. 05/24/2019 on evaluation today patient unfortunately appears to be doing significantly worse compared to his prior evaluation with me 2 weeks ago. I did review his culture results which showed that he does have Staph aureus as well as Pseudomonas noted on the culture. Nonetheless the Levaquin that I prescribed for him does not appear to have been appropriate and in fact he tells me he is no longer experiencing the green drainage and discharge that he had at the last visit. Fortunately there is no signs of active infection at this time which is good news although the wound has significantly worsened it in fact is much deeper than it was previous. We have been utilizing up to this point triamcinolone ointment as the prescription topical of choice but at this time I really feel like that the wound is getting need to be packed in order to appropriately manage this due to the deeper nature of the wound. Therefore something along the lines of an alginate dressing may be more appropriate. 05/31/2019 upon inspection today patient's wound actually showed signs of doing poorly at this point. Unfortunately he just does not seem to be making any good  progress despite what we have tried. He actually did go ahead and pick up the Cipro and start taking that as he was noticing more green drainage he had previously completed the Levaquin that I prescribed for him as well. Nonetheless he missed his appointment for the seventh last week on Wednesday with the wound care center and [facility] where his dermatologist referred him. Obviously I do think a second opinion would be helpful at this point especially in light of the fact that the patient seems to be doing so poorly despite the  fact that we have tried everything that I really know how at this point. The only thing that ever seems to have helped him in the past is when he was on high doses of continual steroids that did seem to make a difference for him. 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 06/07/2019 upon evaluation today patient unfortunately appears to be doing worse yet again with regard to his wound. In fact I am starting to question whether or not he may have a fluid pocket in the muscle at this point based on the bulging and the soft appearance to the central portion of the muscle area. There is not anything draining from the muscle itself at this time which is good news but nonetheless the wound is expanding. I am not really seeing any results of the Humira as far as overall wound progression based on what I am seeing at this point. The patient has been referred for second opinion with regard to his wound to the [facility] wound care center by his dermatologist which I definitely am not in opposition to. Unfortunately we tried multiple dressings in the past including collagen, alginate, and at one point even Hydrofera Blue. With that being said he is never really used it for any significant amount of time due to the fact that he often  complains of pain associated with these dressings and then will go back to either using the Santyl which she has done intermittently or more frequently the triamcinolone. He is also using his own compression stockings. We have wrapped him in the past but again that was something else that he really was not a big fan of. 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 He seemed to do much better when he was on the steroids although honestly I understand that the steroids are not necessarily the best medication to be on long-term obviously 06/14/2019 on evaluation today patient appears to be doing actually a little bit better with regard to the overall appearance with his leg. Unfortunately he does continue to have issues with what appears to be some fluid underneath the muscle although he did see the wound specialty center at [facility] last week their main goals were to see about infusion therapy in place of the Humira as they feel like that is not quite strong enough. They also recommended that we continue with the treatment otherwise as we are they felt like that was appropriate and they are okay with him continuing to follow-up here with [place] in that regard. With that being said they are also sending him to the vein specialist there to see about vein stripping and if that would be of benefit for him. Subsequently they also did not really address whether or not an ultrasound of the muscle area to see if there is anything that needs to be addressed here would be appropriate or not. For that reason I discussed this with him last week I think we  may proceed down that road at this point. 06/21/2019  upon evaluation today patient's wound actually appears to be doing slightly better compared to previous evaluations. I do believe that he has made a difference with regard to the progression here with the use of oral steroids. Again in the past has been the only thing that is really calm things down. He does tell me that from [facility] is gotten a good news from there that there are no further vein stripping that is necessary at this point. I do not have that available for review today although the patient did relay this to me. He also did obtain and have the ultrasound of the wound completed which I did sign off on today. It does appear that there is no fluid collection under the muscle this is likely then just edematous tissue in general. That is also good news. Overall I still believe the inflammation is the main issue here. He did inquire about the possibility of a wound VAC again with the muscle protruding like it is I am not really sure whether the wound VAC is necessarily ideal or not. That is something we will have to consider although I do believe he may need compression wrapping to try to help with edema control which could potentially be of benefit. 06/28/2019 on evaluation today patient appears to be doing slightly better measurement wise although this is not terribly smaller he least seems to be trending towards that direction. With that being said he still seems to have purulent drainage noted in the wound bed at this time. He has been on Levaquin followed by Cipro over the past month. Unfortunately he still seems to have some issues with active infection at this time. I did perform a culture last week in order to evaluate and see if indeed there was still anything going on. Subsequently the culture did come back Fahringer, Derico ([id number]) showing Pseudomonas which is consistent with the drainage has been having which is blue-green in color. He also has had an odor that again was somewhat  consistent with Pseudomonas as well. Long story short it appears that the culture showed an intermediate finding with regard to how well the Cipro will work for the Pseudomonas infection. Subsequently being that he does not seem to be clearing up and at best what we are doing is just keeping this at [place] I think he may need to see infectious disease to discuss IV antibiotic options. 07/05/2019 upon evaluation today patient appears to be doing okay in regard to his leg ulcer. He has been tolerating the dressing changes at this point without complication. Fortunately there is no signs of active infection at this time which is good news. No fevers, chills, nausea, vomiting, or diarrhea. With that being said he does have an appointment with infectious disease tomorrow and his primary care on Wednesday. Again the reason for the infectious disease referral was due to the fact that he did not seem to be fully resolving with the use of oral antibiotics and therefore we were thinking that IV antibiotic therapy may be necessary secondary to the fact that there was an intermediate finding for how effective the Cipro may be. Nonetheless again he has been having a lot of purulent and even green drainage. Fortunately right now that seems to have calmed down over the past week with the reinitiation of the oral antibiotic. Nonetheless we will see what Dr. Megan Salon has to say. 07/12/2019 upon evaluation today patient appears to be  doing about the same at this point in regard to his left lower extremity ulcer. Fortunately there is no signs of active infection at this time which is good news I do believe the Levaquin has been beneficial I did review Dr. Hale Bogus note and to be honest I agree that the patient's leg does appear to be doing better currently. What we found in the past as he does not seem to really completely resolve he will stop the antibiotic and then subsequently things will revert back to having issues with  blue-green drainage, increased pain, and overall worsening in general. Obviously that is the reason I sent him back to infectious disease. 07/19/2019 upon evaluation today patient appears to be doing roughly the same in size there is really no dramatic improvement. He has started back on the Levaquin at this point and though he seems to be doing okay he did still have a lot of blue/green drainage noted on evaluation today unfortunately. I think that this is still indicative more likely of a Pseudomonas infection as previously noted and again he does see Dr. Megan Salon in just a couple of days. I do not know that were really able to effectively clear this with just oral antibiotics alone based on what I am seeing currently. Nonetheless we are still continue to try to manage as best we can with regard to the patient and his wound. I do think the wrap was helpful in decreasing the edema which is excellent news. No fevers, chills, nausea, vomiting, or diarrhea. 07/26/2019 upon evaluation today patient appears to be doing slightly better with regard to the overall appearance of the muscle there is no dark discoloration centrally. Fortunately there is no signs of active infection at this time. No fevers, chills, nausea, vomiting, or diarrhea. Patient's wound bed currently the patient did have an appointment with Dr. Megan Salon at infectious disease last week. With that being said Dr. Megan Salon the patient states was still somewhat hesitant about put him on any IV antibiotics he wanted [place] to repeat cultures today and then see where things go going forward. He does look like Dr. Megan Salon because of some improvement the patient did have with the Levaquin wanted [place] to see about repeating cultures. If it indeed grows the Pseudomonas again then he recommended a possibility of considering a PICC line placement and IV antibiotic therapy. He plans to see the patient back in 1 to 2 weeks. 08/02/2019 upon evaluation today  patient appears to be doing poorly with regard to his left lower extremity. We did get the results of his culture back it shows that he is still showing evidence of Pseudomonas which is consistent with the purulent/blue-green drainage that he has currently. Subsequently the culture also shows that he now is showing resistance to the oral fluoroquinolones which is unfortunate as that was really the only thing to treat the infection prior. I do believe that he is looking like this is going require IV antibiotic therapy to get this under control. Fortunately there is no signs of systemic infection at this time which is good news. The patient does see Dr. Megan Salon tomorrow. 08/09/2019 upon evaluation today patient appears to be doing better with regard to his left lower extremity ulcer in regard to the overall appearance. He is currently on IV antibiotic therapy. As ordered by Dr. Megan Salon. Currently the patient is on ceftazidime which she is going to take for the next 2 weeks and then follow-up for 4 to 5-week appointment with Dr.  Megan Salon. The patient started this this past Friday symptoms have not for a total of 3 days currently in full. 08/16/2019 upon evaluation today patient's wound actually does show muscle in the base of the wound but in general does appear to be much better as far as the overall evidence of infection is concerned. In fact I feel like this is for the most part cleared up he still on the IV antibiotics he has not completed the full course yet but I think he is doing much better which is excellent news. 08/23/2019 upon evaluation today patient appears to be doing about the same with regard to his wound at this point. He tells me that he still has pain unfortunately. Fortunately there is no evidence of systemic infection at this time which is great news. There is significant muscle protrusion. 09/13/19 upon evaluation today patient appears to be doing about the same in regard to his leg  unfortunately. He still has a lot of drainage coming from the ulceration there is still muscle exposed. With that being said the patient's last wound culture still showed an intermediate finding with regard to the Pseudomonas he still having the bluish/green drainage as well. Overall I do not know that the wound has completely cleared of infection at this point. Fortunately there is no signs of active infection systemically at this point which is good news. 09/20/2019 upon evaluation today patient's wound actually appears to be doing about the same based on what I am seeing currently. I do not see any signs of systemic infection he still does have evidence of some local infection and drainage. He did see Dr. Megan Salon last week and Dr. Megan Salon states that he probably does need a different IV antibiotic although he does not want to put him on this until the patient begins the Remicade infusion which is actually scheduled for about 10 days out from today on 13 May. Following that time Dr. Megan Salon is good to see him back and then will evaluate the feasibility of starting him on the IV antibiotic therapy once again at that point. I do not disagree with this plan I do believe as Dr. Megan Salon stated in his note that I reviewed today that the patient's issue is multifactorial with the pyoderma being 1 aspect of this that were hoping the Remicade will be helpful for her. In the meantime I think the gentamicin is, helping to keep things under decent okay control in regard to the ulcer. 09/27/2019 upon evaluation today patient appears to be doing about the same with regard to his wound still there is a lot of muscle exposure though he does have some hyper granulation tissue noted around the edge and actually some granulation tissue starting to form over the muscle which is actually good news. Fortunately there is no evidence of active infection which is also good news. His pain is less at this point. 5/21; this  is a patient I have not seen in a long time. 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 He has a large wound on the left lateral leg with protruding muscle. He comes in the clinic today showing the same area on his left medial ankle. He says there is been a spot there for some time although we have not previously defined this. Today he has a clearly defined area with slight amount of skin breakdown surrounded by raised areas with a purplish hue in color. This is not painful he says it is  irritated. 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 He still has muscle protruding from the lateral portion of his left leg but fortunately the new area that they were concerned about at his last visit does not appear to have opened at this point. He is currently on Remicade infusions and seems to be doing better in my opinion in fact the wound itself seems to be overall much better. The purplish discoloration that he did have seems to have resolved and I think that is a good sign that hopefully the Remicade is doing its job. He does Caylor, Juanangel ([id number]) have some biofilm noted over the surface of the wound. 11/01/2019 on evaluation today patient's wound actually appears to be doing excellent at this time. Fortunately there is no evidence of active infection and overall I feel like he is making great progress. The Remicade seems to be due excellent job in my opinion. 11/08/19 evaluation today vision actually appears to be doing quite well with regard to his weight ulcer. He's been tolerating dressing changes without complication. Fortunately there is no evidence of infection. No fevers, chills, nausea, or vomiting noted at this time. Overall states that is having more itching than pain which is actually a good sign in my opinion. 12/13/2019 upon evaluation today patient  appears to be doing well today with regard to his wound. He has been tolerating the dressing changes without complication. Fortunately there is no sign of active infection at this time. No fevers, chills, nausea, vomiting, or diarrhea. Overall I feel like the infusion therapy has been very beneficial for him. 01/06/2020 on evaluation today patient appears to be doing well with regard to his wound. This is measuring smaller and actually looks to be doing better. Fortunately there is no signs of active infection at this point. No fevers, chills, nausea, vomiting, or diarrhea. With that being said he does still have the blue-green drainage but this does not seem to be causing any significant issues currently. He has been using the gentamicin that does seem to be keeping things under decent control at this point. He goes later this morning for his next infusion therapy for the pyoderma which seems to also be very beneficial. 02/07/2020 on evaluation today patient appears to be doing about the same in regard to his wounds currently. Fortunately there is no signs of active infection systemically he does still have evidence of local infection still using gentamicin. He also is showing some signs of improvement albeit slowly I do feel like we are making some progress here. 02/21/2020 upon evaluation today patient appears to be making some signs of improvement the wound is measuring a little bit smaller which is great news and overall I am very pleased with where he stands currently. He is going to be having infusion therapy treatment on the 15th of this month. Fortunately there is no signs of active infection at this time. 03/13/2020 I do believe patient's wound is actually showing some signs of improvement here which is great news. He has continue with the infusion therapy through rheumatology/dermatology at [facility]. That does seem to be beneficial. I still think he gets as much benefit from this as he did from  the prednisone initially but nonetheless obviously this is less harsh on his body that the prednisone as far as they are concerned. 03/31/2020 on evaluation today patient's wound actually showing signs of some pretty good improvement in  regard to the overall appearance of the wound bed. There is still muscle exposed though he does have some epithelial growth around the edges of the wound. Fortunately there is no signs of active infection at this time. No fevers, chills, nausea, vomiting, or diarrhea. 04/24/2020 upon evaluation today patient appears to be doing about the same in regard to his leg ulcer. He has been tolerating the dressing changes without complication. Fortunately there is no signs of active infection at this time. No fevers, chills, nausea, vomiting, or diarrhea. With that being said he still has a lot of irritation from the bandaging around the edges of the wound. We did discuss today the possibility of a referral to plastic surgery. 05/22/2020 on evaluation today patient appears to be doing well with regard to his wounds all things considered. He has not been able to get the Chantix apparently there is a recall nurse that I was unaware of put out by [employer] involuntarily. Nonetheless for now I am and I have to do some research into what may be the best option for him to help with quitting in regard to smoking and we discussed that today. 06/26/2020 upon evaluation today patient appears to be doing well with regard to his wound from the standpoint of infection I do not see any signs of infection at this point. With that being said unfortunately he is still continuing to have issues with muscle exposure and again he is not having a whole lot of new skin growth unfortunately. There does not appear to be any signs of active infection at this time. No fevers, chills, nausea, vomiting, or diarrhea. 07/10/2020 upon evaluation today patient appears to be doing a little bit more poorly currently  compared to where he was previous. I am concerned currently about an active infection that may be getting worse especially in light of the increased size and tenderness of the wound bed. No fevers, chills, nausea, vomiting, or diarrhea. 07/24/2020 upon evaluation today patient appears to be doing poorly in regard to his leg ulcer. He has been tolerating the dressing changes without complication but unfortunately is having a lot of discomfort. 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 Subsequently I think he is going require possibly IV antibiotics to get this under control. I am very concerned about the severity of his infection and the amount of discomfort he is having. 07/31/2020 upon evaluation today patient appears to be doing about the same in regard to his leg wound. He did see Dr. Megan Salon and Dr. Megan Salon is actually going to start him on IV antibiotics. He goes for the PICC line tomorrow. With that being said there do not have that run for 2 weeks and then see how things are doing and depending on how he is progressing they may extend that a little longer. Nonetheless I am glad this is getting ready to be in place and definitely feel it may help the patient. In the meantime is been using mainly triamcinolone to the wound bed has an anti-inflammatory. 08/07/2020 on evaluation today patient appears to be doing well with regard to his wound compared even last week. In the interim he has gotten the PICC line placed and overall this seems to be doing excellent. There does not appear to be any evidence of infection which is great news systemically although locally of course has had the infection this appears to be improving with the use of the antibiotics. 08/14/2020 upon  evaluation today patient's wound actually showing signs of excellent improvement. Overall the irritation has significantly improved the drainage is back down to  more of a normal level and his pain is really pretty much nonexistent compared to what it was. Obviously I think that this is significantly improved secondary to the IV antibiotic therapy which has made all the difference in the world. Again he had a resistant form of Pseudomonas for which oral antibiotics just was not cutting it. Nonetheless I do think that still we need to consider the possibility of a surgical closure for this wound is been open so long and to be honest with muscle exposed I think this can be very hard to get this to close outside of this although definitely were still working to try to do what we can in that regard. 08/21/2020 upon evaluation today patient appears to be doing very well with regard to his wounds on the left lateral lower extremity/calf area. Fortunately there does not appear to be signs of active infection which is great news and overall very pleased with where things stand today. He is actually wrapping up his treatment with IV antibiotics tomorrow. After that we will see where things go from there. 08/28/2020 upon evaluation today patient appears to be doing decently well with regard to his leg ulcer. There does not appear to be any signs of Bassin, Gregg ([id number]) active infection which is great news and overall very pleased with where things stand today. No fevers, chills, nausea, vomiting, or diarrhea. 09/18/2020 upon evaluation today patient appears to be doing well with regard to his infection which I feel like is better. Unfortunately he is not doing as well with regard to the overall size of the wound which is not nearly as good at this point. I feel like that he may be having an issue here with the pyoderma being somewhat out of control. I think that he may benefit from potentially going back and talking to the dermatologist about what to do from the pyoderma standpoint. I am not certain if the infusions are helping nearly as much is what the prednisone did  in the past. 10/02/2020 upon evaluation today patient appears to be doing well with regard to his leg ulcer. He did go to the Psychiatric nurse. Unfortunately they feel like there is a 10% chance that most that he would be able to heal and that the skin graft would take. Obviously this has led him to not be able to go down that path as far as treatment is concerned. Nonetheless he does seem to be doing a little bit better with the prednisone that I gave him last time. I think that he may need to discuss with dermatology the possibility of long-term prednisone as that seems to be what is most helpful for him to be perfectly honest. I am not sure the Remicade is really doing the job. 10/17/2020 upon evaluation today patient appears to be doing a little better in regard to his wound. In fact the case has been since we did the prednisone on May 2 for him that we have noticed a little bit of improvement each time we have seen a size wise as well as appearance wise as well as pain wise. I think the prednisone has had a greater effect then the infusion therapy has to be perfectly honest. With that being said the patient also feels significantly better compared to what he was previous. All of this is good news but  nonetheless I am still concerned about the fact that again we are really not set up to long-term manage him as far as prednisone is concerned. Obviously there are things that you need to be watched I completely understand the risk of prednisone usage as well. 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 With all that being said I do believe that we can give him another round of the prednisone which she is requesting today because of the improvement that he seen since we did that first round. 10/30/2020 upon evaluation today patient's wound actually is showing signs of doing quite well. There does not appear to be any evidence of infection which is great news  and overall very pleased with where things stand today. No fevers, chills, nausea, vomiting, or diarrhea. He tells me that the prednisone still has seem to have helped he wonders if we can extend that for just a little bit longer. He did not have the appointment with a dermatologist although he did have an infusion appointment last Friday. That was at [facility]. With that being said he tells me he could not do both that as well as the appointment with the physician on the same day therefore that is can have to be rescheduled. I really want to see if there is anything they feel like that could be done differently to try to help this out as I am not really certain that the infusions are helping significantly here. 11/13/2020 upon evaluation today patient unfortunately appears to be doing somewhat poorly in regard to his wound I feel like this is actually worsening from the standpoint of the pyoderma spreading. I still feel like that he may need something different as far as trying to manage this going forward. Again we did the prednisone unfortunately his blood sugars are not doing so well because of this. Nonetheless I believe that the patient likely needs to try topical steroid. We have done triamcinolone for a while I think going with something stronger such as clobetasol could be beneficial again this is not something I do lightly I discussed this with the patient that again this does not normally put underneath an occlusive dressing. Nonetheless I think a thin film as such could help with some of the stronger anti-inflammatory effects. We discussed this today. He would like to try to give this a trial for the next couple weeks. I definitely think that is something that we can do. Evaluate7/03/2021 and today patient's wound bed actually showed signs of doing really about the same. There was a little expansion of the size of the wound and that leading edge that we done looking out although the clobetasol  does seem to have slowed this down a bit in my opinion. There is just 1 small area that still seems to be progressing based on what I see. Nonetheless I am concerned about the fact this does not seem to be improving if anything seems to be doing a little bit worse. I do not know that the infusions are really helping him much as next infusion is August 5 his appointment with dermatology is July 25. Either way I really think that we need to have a conversation potentially about this and I am actually going to see if I can talk with Dr. Lillia Carmel in order to see where things stand as well. 12/11/2020 upon evaluation today patient appears to be doing worse in regard to his leg ulcer.  Unfortunately I just do not think this is making the progress that I would like to see at this point. Honestly he does have an appointment with dermatology and this is in 2 days. I am wondering what they may have to offer to help with this. Right now what I am seeing is that he is continuing to show signs of worsening little by little. Obviously that is not great at all. Is the exact opposite of what we are looking for. 12/18/2020 upon evaluation today patient appears to be doing a little better in regard to his wound. The dermatologist actually did do some steroid injections into the wound which does seem to have been beneficial in my opinion. That was on the 25th already this looks a little better to me than last time I saw him. With that being said we did do a culture and this did show that he has Staph aureus noted in abundance in the wound. With that being said I do think that getting him on an oral antibiotic would be appropriate as well. Also think we can compression wrap and this will make a difference as well. 12/28/2020 upon evaluation today patient's wound is actually showing signs of doing much better. I do believe the compression wrap is helping he has a lot of drainage but to be honest I think that the compression  is helping to some degree in this regard as well as not draining through which is also good news. No fevers, chills, nausea, vomiting, or diarrhea. 01/04/2021 upon evaluation today patient appears to be doing well with regard to his wound. Overall things seem to be doing quite well. He did have a little bit of reaction to the CarboFlex Sorbact he will be using that any longer. With that being said he is controlled as far as the drainage is concerned overall and seems to be doing quite well. I do not see any signs of active infection at this time which is great news. No fevers, chills, nausea, vomiting, or diarrhea. 01/11/2021 upon evaluation today patient appears to be doing well with regard to his wounds. He has been tolerating the dressing changes without complication. Fortunately there does not appear to be any signs of active infection at this time which is great news. Overall I am extremely pleased with where we stand currently. No fevers, chills, nausea, vomiting, or diarrhea. Where using clobetasol in the wound bed he has a lot of new skin growth which is awesome as well. 01/18/2021 upon evaluation today patient appears to be doing very well in regard to his leg ulcer. He has been tolerating the dressing changes without complication. Fortunately there does not appear to be any signs of active infection which is great news. In general I think that he is making excellent progress 01/25/2021 upon evaluation today patient appears to be doing well with regard to his wound on the leg. I am actually extremely pleased with where things stand today. There does not appear to be any signs of active infection which is great news and overall I think that we are definitely headed in the appropriate direction based on what I am seeing currently. There does not appear to be any signs of active infection also excellent news. 02/06/2021 upon evaluation today patient appears to be doing well with regard to his wound.  Overall visually this is showing signs of significant Rowles, Yaden ([id number]) improvement which is great news. I do not see any signs of active infection systemically which  is great even locally I do not think that we are seeing any major complications here. We did do fluorescence imaging with the [employer] DX today. The patient does have some odor and drainage noted and again this is something that I think would benefit him to probably come more frequently for nurse visits. 02/19/2021 upon evaluation today patient actually appears to be doing quite well in regard to his wound. He has been tolerating the dressing changes without complication and overall I think that this is making excellent progress. I do not see any evidence of active infection at this point which is great news as well. No fevers, chills, nausea, vomiting, or diarrhea. 10/10; wound is made nice progress healthy granulation with a nice rim of epithelialization which seems to be expanding even from last week he has a deeper area in the inferior part of the more distal part of the wound with not quite as healthy as surface. This area will need to be followed. Using clobetasol and Hydrofera Blue 03/05/2021 upon evaluation today patient appears to be doing very well in regard to his leg ulcer. He has been tolerating dressing changes without complication. Fortunately there does not appear to be any signs of active infection which is great news and overall I am extremely pleased with where we stand currently. 03/12/2021 upon evaluation today patient appears to be doing well with regard to his wound in fact this is extremely extremely good based on what we are seeing today there does not appear to be any signs of active infection and overall I think that he is doing awesome from the standpoint of healing in general. I am extremely pleased with how things seem to be progressing with regard to this pyoderma. Clobetasol has done wonders  for him. I think the compression wrapping has also been of great benefit. 03/19/2021 upon evaluation today patient appears to be doing well with regard to his wound. He is tolerating the dressing changes without complication. In fact I feel like that he is actually making excellent progress at this point based on what I am seeing. No fevers, chills, nausea, vomiting, or diarrhea. 03/26/2021 upon evaluation today patient appears to be doing well with regard to his wound. This again is measuring smaller and looking better. Again the progress is slow but nonetheless continual with what we have been seeing. I do believe that the current plan is doing awesome for him. 04/09/2021 upon evaluation today patient appears to be doing well with regard to his leg ulcer. This is showing signs of excellent improvement the muscle is completely closed over and there does not appear to be any evidence of inflammation at this point his drainage is significantly improved. Overall I think that he would be a good candidate for looking into a skin substitute at this point as well. We will get a look into some approvals in that regard. Potentially TheraSkin as well as Apligraf could both be considered just depending on insurance coverage. 04/16/2021 upon evaluation today patient appears to be doing well at this point. He has been approved for the Apligraf which we could definitely order although I would like to try to get the TheraSkin approved if at all possible. I did fax notes into them today and I Georgina Peer try to give a call as well. Overall the wound appears to be doing decently well today. 04/20/2021 upon evaluation today patient actually appears to be doing quite well in regard to his wound with that being said we  are trying to see what we can do about speeding up the healing process. For that reason we did discuss the possibility of a skin substitute. We got the Apligraf approved. For that reason we will get a go ahead  and see what we can do with the Apligraf at this point. I am still trying to get the TheraSkin approved but I have not heard anything from the insurance company yet I have called and talked to them earlier in the week and to be honest this was about half an hour that I spent on the phone they told me I would hear something within 10-15 business days. 04/27/2021 upon evaluation today patient appears to be doing excellent in regard to his wound. I do believe the Apligraf has been beneficial. With that being said he has had a little bit of increased pain has been a little bit concerned. I do want to go ahead and apply his steroid cream today the clobetasol and then put the Apligraf over I just do not think I want to risk not putting the clobetasol on based on what is been going on here currently. Patient voiced understanding. 05/04/2021 upon evaluation today patient is making excellent improvement. Overall there is definitely decrease in the size of the wound today and I am very pleased in that regard. I do not see any signs of active infection locally nor systemically at this point. No fevers, chills, nausea, vomiting, or diarrhea. 05/11/2021 upon evaluation today patient appears to be doing well with regard to his wound. He has been tolerating Apligraf's without complication and is making excellent progress this is application #4 today. Objective Constitutional Obese and well-hydrated in no acute distress. Vitals Time Taken: 8:12 AM, Height: 71 in, Weight: 338 lbs, BMI: 47.1, Temperature: 98.1 F, Pulse: 94 bpm, Respiratory Rate: 18 breaths/min, Blood Pressure: 153/80 mmHg. Respiratory normal breathing without difficulty. Psychiatric this patient is able to make decisions and demonstrates good insight into disease process. Alert and Oriented x 3. pleasant and cooperative. ZAID, TOMES ([id number]) General Notes: Patient's wound did not require any sharp debridement I did mechanically clear away  some of the slough and biofilm which came off really easily just with saline and gauze lightly fortunately he is doing awesome and I really feel like he is very close to getting this closed I think the Apligraf is brought this a tremendous way. Integumentary (Hair, Skin) Wound #1 status is Open. Original cause of wound was Gradually Appeared. The date acquired was: 11/18/2015. The wound has been in treatment 231 weeks. The wound is located on the Left,Lateral Lower Leg. The wound measures 2.8cm length x 2.4cm width x 0.2cm depth; 5.278cm^2 area and 1.056cm^3 volume. There is Fat Layer (Subcutaneous Tissue) exposed. There is no tunneling or undermining noted. There is a medium amount of serosanguineous drainage noted. The wound margin is flat and intact. There is large (67-100%) red granulation within the wound bed. There is a small (1-33%) amount of necrotic tissue within the wound bed including Adherent Slough. 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 A skin graft procedure using a bioengineered skin substitute/cellular or tissue based product was performed by Tommie Sams., PA-C with the following instrument(s): Forceps and Scissors. Apligraf was  applied and secured with Steri-Strips. 22 sq cm of product was utilized and 22 sq cm was wasted due to wound size. Post Application, mepitel one was applied. A Time Out was conducted at 08:39, prior to the start of the procedure. The procedure was tolerated well with a pain level of 0 throughout and a pain level of 0 following the procedure. Post procedure Diagnosis Wound #1: Same as  Pre-Procedure . Pre-procedure diagnosis of Wound #1 is a Pyoderma located on the Left,Lateral Lower Leg . There was a Four Layer Compression Therapy Procedure with a pre-treatment ABI of 1.2 by [name], RN. Post procedure Diagnosis Wound #1: Same as Pre-Procedure Plan Follow-up Appointments: Return Appointment in 1 week. Nurse Visit as needed - twice a week Bathing/ Shower/ Hygiene: Clean wound with Normal Saline or wound cleanser. Cellular or Tissue Based Products: Cellular or Tissue Based Product Type: - APLIGRAF #4 Cellular or Tissue Based Product applied to wound bed; including contact layer, fixation with steri-strips, dry gauze and cover dressing. (DO NOT REMOVE). Edema Control - Lymphedema / Segmental Compressive Device / Other: Optional: One layer of unna paste to top of compression wrap (to act as an anchor). Elevate, Exercise Daily and Avoid Standing for Long Periods of Time. Elevate legs to the level of the heart and pump ankles as often as possible Elevate leg(s) parallel to the floor when sitting. WOUND #1: - Lower Leg Wound Laterality: Left, Lateral Cleanser: Wound Cleanser 3 x Per Week/30 Days Discharge Instructions: Wash your hands with soap and water. Remove old dressing, discard into plastic bag and place into trash. Cleanse the wound with Wound Cleanser prior to applying a clean dressing using gauze sponges, not tissues or cotton balls. Do not scrub or use excessive force. Pat dry using gauze sponges, not tissue or cotton balls. Peri-Wound Care: Moisturizing Lotion 3 x Per Week/30 Days Discharge Instructions: Suggestions: Theraderm, Eucerin, Cetaphil, or patient preference. Secondary Dressing: Xtrasorb Medium 4x5 (in/in) 3 x Per Week/30 Days JAVAUGHN, OPDAHL ([id number]) Discharge Instructions: Apply to wound as directed. Do not cut. Compression Wrap: Medichoice 4 layer Compression System, 35-40 mmHG (Generic) 3 x Per Week/30 Days Discharge Instructions: Apply  multi-layer wrap as directed. 1. I would recommend currently again that we continue with the Apligraf that was performed today I did apply the clobetasol first following the clobetasol I applied Apligraf then Mepitel and Steri-Strips to secure in place. He has no pain currently which is great news. 2. I am also can recommend that we continue with a 4-layer compression wrap which I think has been doing a great job he has very little drainage at this point. We will see patient back for reevaluation in 1 week here in the clinic. If anything worsens or changes patient will contact our office for additional recommendations. Electronic Signature(s) Signed: 05/11/2021 9:09:04 AM By: Worthy Keeler PA-C Entered By: Worthy Keeler on 05/11/2021 09:09:04 JEFFEY, JANSSEN ([id number]) -------------------------------------------------------------------------------- SuperBill Details Patient Name: [name] Date of Service: 05/11/2021 Medical Record Number: [id number] Patient Account Number: [id number] Date of Birth/Sex: [birth year] (40 y.o. M) Treating RN: [name] Primary Care Provider: [name] Other Clinician: Referring Provider: [name] Treating Provider/Extender: [name] Cliche in Treatment: 231 Diagnosis Coding ICD-10 Codes Code Description I87.2 Venous insufficiency (chronic) (peripheral) L97.222 Non-pressure chronic ulcer of left calf with fat layer exposed E11.622 Type 2 diabetes mellitus with other skin ulcer L88 Pyoderma gangrenosum F17.208 Nicotine dependence, unspecified, with other nicotine-induced disorders Facility Procedures CPT4 Code: [id number] Description: [phone] (  Facility Use Only) Apligraf 44 SQ CM Modifier: Quantity: [place] Code: [id number] Description: 02774 - SKIN SUB GRAFT TRNK/ARM/LEG Modifier: Quantity: 1 CPT4 Code: Description: ICD-10 Diagnosis Description L97.222 Non-pressure chronic ulcer of left calf with fat layer  exposed Modifier: Quantity: Physician Procedures CPT4 Code: [id number] Description: 67209 - WC PHYS SKIN SUB GRAFT TRNK/ARM/LEG Modifier: Quantity: 1 CPT4 Code: Description: ICD-10 Diagnosis Description L97.222 Non-pressure chronic ulcer of left calf with fat layer exposed Modifier: Quantity: Electronic Signature(s) Signed: 05/11/2021 9:09:18 AM By: Worthy Keeler PA-C Entered By: Worthy Keeler on 05/11/2021 09:09:17

## 2021-05-15 ENCOUNTER — Other Ambulatory Visit: Payer: Self-pay

## 2021-05-15 DIAGNOSIS — E11622 Type 2 diabetes mellitus with other skin ulcer: Secondary | ICD-10-CM | POA: Diagnosis not present

## 2021-05-15 NOTE — Progress Notes (Signed)
MANLEY, FASON ([id number]) Visit Report for 05/15/2021 Arrival Information Details Patient Name: [name], [name] Date of Service: 05/15/2021 8:15 AM Medical Record Number: [id number] Patient Account Number: [id number] Date of Birth/Sex: May 28, 1980 (40 y.o. M) Treating RN: Levora Dredge Primary Care Aleah Ahlgrim: [name] Other Clinician: Referring Allissa Albright: [name] Treating Doyel Mulkern/Extender: Tito Dine in Treatment: 65 Visit Information History Since Last Visit Added or deleted any medications: No Patient Arrived: Ambulatory Any new allergies or adverse reactions: No Arrival Time: 08:06 Had a fall or experienced change in No Accompanied By: self activities of daily living that may affect Transfer Assistance: None risk of falls: Patient Identification Verified: Yes Hospitalized since last visit: No Secondary Verification Process Completed: Yes Has Dressing in Place as Prescribed: Yes Patient Requires Transmission-Based No Has Compression in Place as Prescribed: Yes Precautions: Pain Present Now: No Patient Has Alerts: Yes Patient Alerts: Patient has reaction to silver dressings. Electronic Signature(s) Signed: 05/15/2021 8:42:17 AM By: Levora Dredge Entered By: Levora Dredge on 05/15/2021 08:42:16 [name], [name] ([id number]) -------------------------------------------------------------------------------- Clinic Level of Care Assessment Details Patient Name: [name], [name] Date of Service: 05/15/2021 8:15 AM Medical Record Number: [id number] Patient Account Number: [id number] Date of Birth/Sex: 07-15-1980 (40 y.o. M) Treating RN: Levora Dredge Primary Care Dietrick Barris: [name] Other Clinician: Referring Ruari Duggan: [name] Treating Jakeria Caissie/Extender: Tito Dine in Treatment: [facility] Level of Care Assessment Items TOOL 1 Quantity Score []  - Use when EandM and Procedure is performed on INITIAL visit 0 ASSESSMENTS -  Nursing Assessment / Reassessment []  - General Physical Exam (combine w/ comprehensive assessment (listed just below) when performed on new 0 pt. evals) []  - 0 Comprehensive Assessment (HX, ROS, Risk Assessments, Wounds Hx, etc.) ASSESSMENTS - Wound and Skin Assessment / Reassessment []  - Dermatologic / Skin Assessment (not related to wound area) 0 ASSESSMENTS - Ostomy and/or Continence Assessment and Care []  - Incontinence Assessment and Management 0 []  - 0 Ostomy Care Assessment and Management (repouching, etc.) PROCESS - Coordination of Care []  - Simple Patient / Family Education for ongoing care 0 []  - 0 Complex (extensive) Patient / Family Education for ongoing care []  - 0 Staff obtains Programmer, systems, Records, Test Results / Process Orders []  - 0 Staff telephones HHA, Nursing Homes / Clarify orders / etc []  - 0 Routine Transfer to another Facility (non-emergent condition) []  - 0 Routine Hospital Admission (non-emergent condition) []  - 0 New Admissions / Biomedical engineer / Ordering NPWT, Apligraf, etc. []  - 0 Emergency Hospital Admission (emergent condition) PROCESS - Special Needs []  - Pediatric / Minor Patient Management 0 []  - 0 Isolation Patient Management []  - 0 Hearing / Language / Visual special needs []  - 0 Assessment of Community assistance (transportation, D/C planning, etc.) []  - 0 Additional assistance / Altered mentation []  - 0 Support Surface(s) Assessment (bed, cushion, seat, etc.) INTERVENTIONS - Miscellaneous []  - External ear exam 0 []  - 0 Patient Transfer (multiple staff / Civil Service fast streamer / Similar devices) []  - 0 Simple Staple / Suture removal (25 or less) []  - 0 Complex Staple / Suture removal (26 or more) []  - 0 Hypo/Hyperglycemic Management (do not check if billed separately) []  - 0 Ankle / Brachial Index (ABI) - do not check if billed separately Has the patient been seen at the hospital within the last three years: Yes Total Score:  0 Level Of Care: ____ [name] ([id number]) Electronic Signature(s) Signed: 05/15/2021 4:34:52 PM By: Levora Dredge Entered By: Levora Dredge on 05/15/2021 08:43:57 [name], [name] (  [id number]) -------------------------------------------------------------------------------- Compression Therapy Details Patient Name: [name], [name] Date of Service: 05/15/2021 8:15 AM Medical Record Number: [id number] Patient Account Number: [id number] Date of Birth/Sex: February 13, 1981 (40 y.o. M) Treating RN: Levora Dredge Primary Care Baleigh Rennaker: [name] Other Clinician: Referring Sherrine Salberg: [name] Treating Xoie Kreuser/Extender: Tito Dine in Treatment: 231 Compression Therapy Performed for Wound Assessment: Non-Wound Location Performed By: Clinician Levora Dredge, RN Compression Type: Four Layer Location: Lower Extremity, Left Electronic Signature(s) Signed: 05/15/2021 4:34:52 PM By: Levora Dredge Entered By: Levora Dredge on 05/15/2021 08:40:43 [name], [name] ([id number]) -------------------------------------------------------------------------------- Encounter Discharge Information Details Patient Name: [name] Date of Service: 05/15/2021 8:15 AM Medical Record Number: [id number] Patient Account Number: [id number] Date of Birth/Sex: [birth year] (40 y.o. M) Treating RN: Levora Dredge Primary Care Alaya Iverson: [name] Other Clinician: Referring Jun Rightmyer: [name] Treating Deakin Lacek/Extender: Tito Dine in Treatment: 231 Encounter Discharge Information Items Discharge Condition: Stable Ambulatory Status: Ambulatory Discharge Destination: Home Transportation: Private Auto Accompanied By: self Schedule Follow-up Appointment: Yes Clinical Summary of Care: Electronic Signature(s) Signed: 05/15/2021 8:43:40 AM By: Levora Dredge Entered By: Levora Dredge on 05/15/2021 08:43:40 [name], [name]  ([id number]) -------------------------------------------------------------------------------- Pain Assessment Details Patient Name: [name] Date of Service: 05/15/2021 8:15 AM Medical Record Number: [id number] Patient Account Number: [id number] Date of Birth/Sex: February 07, 1981 (40 y.o. M) Treating RN: Levora Dredge Primary Care Ann-Marie Kluge: [name] Other Clinician: Referring Raye Slyter: [name] Treating Marytza Grandpre/Extender: Tito Dine in Treatment: 231 Active Problems Location of Pain Severity and Description of Pain Patient Has Paino No Site Locations Rate the pain. Current Pain Level: 0 Pain Management and Medication Current Pain Management: Electronic Signature(s) Signed: 05/15/2021 4:34:52 PM By: Levora Dredge Entered By: Levora Dredge on 05/15/2021 08:12:07 [name] ([id number]) -------------------------------------------------------------------------------- Wound Assessment Details Patient Name: [name] Date of Service: 05/15/2021 8:15 AM Medical Record Number: [id number] Patient Account Number: [id number] Date of Birth/Sex: Jul 19, 1980 (40 y.o. M) Treating RN: Levora Dredge Primary Care Klaire Court: [name] Other Clinician: Referring Paton Crum: [name] Treating Trey Bebee/Extender: Suella [place] in Treatment: 231 Wound Status Wound Number: 1 Primary Etiology: Pyoderma Wound Location: Left, Lateral Lower Leg Wound Status: Open Wounding Event: Gradually Appeared Comorbid History: Sleep Apnea, Hypertension, Colitis Date Acquired: 11/18/2015 Weeks Of Treatment: 231 Clustered Wound: No Wound Measurements Length: (cm) 2.8 Width: (cm) 2.4 Depth: (cm) 0.2 Area: (cm) 5.278 Volume: (cm) 1.056 % Reduction in Area: -7.5% % Reduction in Volume: 73.1% Epithelialization: Medium (34-66%) Wound Description Classification: Full Thickness With Exposed Support Structures Wound Margin: Flat and Intact Exudate Amount: Medium Exudate  Type: Serosanguineous Exudate Color: red, brown Foul Odor After Cleansing: No Slough/Fibrino Yes Wound Bed Granulation Amount: Large (67-100%) Exposed Structure Granulation Quality: Red Fascia Exposed: No Necrotic Amount: Small (1-33%) Fat Layer (Subcutaneous Tissue) Exposed: Yes Necrotic Quality: Adherent Slough Tendon Exposed: No Muscle Exposed: No Joint Exposed: No Bone Exposed: No Assessment Notes Moderate drainage noted on xtrasorb when 4 layer wrap removed. Patient states at last visit debridement was more. Advised patient if he begins to have any fever, chills, large amount of drainage to please call into clinic, patient stated understanding and stated he would and has an appointment for this Friday scheduled. Patient tolerated dressing change without issue. Treatment Notes Wound #1 (Lower Leg) Wound Laterality: Left, Lateral Cleanser Wound Cleanser Discharge Instruction: Wash your hands with soap and water. Remove old dressing, discard into plastic bag and place into trash. Cleanse the wound with Wound Cleanser prior to applying a clean dressing using gauze sponges, not tissues or cotton balls.  Do not scrub or use excessive force. Pat dry using gauze sponges, not tissue or cotton balls. Peri-Wound Care Moisturizing Lotion Discharge Instruction: Suggestions: Theraderm, Eucerin, Cetaphil, or patient preference. Topical Primary Dressing Secondary Dressing Xtrasorb Medium 4x5 (in/in) [name], [name] ([id number]) Discharge Instruction: Apply to wound as directed. Do not cut. Secured With Compression Wrap Medichoice 4 layer Compression System, 35-40 mmHG Discharge Instruction: Apply multi-layer wrap as directed. Compression Stockings Add-Ons Electronic Signature(s) Signed: 05/15/2021 4:34:52 PM By: Levora Dredge Entered By: Levora Dredge on 05/15/2021 08:39:44

## 2021-05-18 ENCOUNTER — Other Ambulatory Visit: Payer: Self-pay

## 2021-05-18 ENCOUNTER — Encounter: Payer: 59 | Admitting: Internal Medicine

## 2021-05-18 DIAGNOSIS — E11622 Type 2 diabetes mellitus with other skin ulcer: Secondary | ICD-10-CM | POA: Diagnosis not present

## 2021-05-18 NOTE — Progress Notes (Addendum)
ARISTOTLE, STELLWAGEN ([id number]) Visit Report for 05/18/2021 Cellular or Tissue Based Product Details Patient Name: [name], [name] Date of Service: 05/18/2021 2:00 PM Medical Record Number: [id number] Patient Account Number: [id number] Date of Birth/Sex: [birth year] (40 y.o. M) Treating RN: Carlene Coria Primary Care Provider: [name] Other Clinician: Referring Provider: [name] Treating Provider/Extender: Tito Dine in Treatment: 78 Cellular or Tissue Based Product Type Wound #1 Left,Lateral Lower Leg Applied to: Performed By: Physician Ricard Dillon, MD Cellular or Tissue Based Product Apligraf Type: Level of Consciousness (Pre- Awake and Alert procedure): Pre-procedure Verification/Time Out Yes - 14:18 Taken: Location: trunk / arms / legs Wound Size (sq cm): 5 Product Size (sq cm): 44 Waste Size (sq cm): 11 Waste Reason: wound size Amount of Product Applied (sq cm): 33 Instrument Used: Forceps, Scissors Lot #: [id number] Order #: [id number] Expiration Date: 05/25/2021 Fenestrated: Yes Instrument: Blade Reconstituted: Yes Solution Type: normal saline Solution Amount: 10 ml Lot #: [id number] Solution Expiration Date: 10/19/2022 Secured: Yes Secured With: Steri-Strips Dressing Applied: Yes Primary Dressing: adaptic Procedural Pain: 0 Post Procedural Pain: 0 Response to Treatment: Procedure was tolerated well Level of Consciousness (Post- Awake and Alert procedure): Post Procedure Diagnosis Same as Pre-procedure Electronic Signature(s) Signed: 05/18/2021 4:17:41 PM By: Carlene Coria RN Entered By: Carlene Coria on 05/18/2021 14:43:31 Baugher, [name] ([id number]) -------------------------------------------------------------------------------- HPI Details Patient Name: [name] Date of Service: 05/18/2021 2:00 PM Medical Record Number: [id number] Patient Account Number: [id number] Date of Birth/Sex: [birth year] (40 y.o. M) Treating RN: Carlene Coria Primary Care Provider: [name] Other Clinician: Referring Provider: [name] Treating Provider/Extender: Tito Dine in Treatment: 62 History of Present Illness HPI Description: 12/04/16; 40 year old man who comes into the clinic today for review of a wound on the posterior left calf. He tells me that is been there for about a year. He is not a diabetic he does smoke half a pack per day. He was seen in the ER on 11/20/16 felt to have cellulitis around the wound and was given clindamycin. An x-ray did not show osteomyelitis. The patient initially tells me that he has a milk allergy that sets off a pruritic itching rash on his lower legs which she scratches incessantly and he thinks that's what may have set up the wound. He has been using various topical antibiotics and ointments without any effect. He works in a [employer] and is on his feet all day. He does not have a prior history of wounds however he does have the rash on both lower legs the right arm and the ventral aspect of his left arm. These are excoriations and clearly have had scratching however there are of macular looking areas on both legs including a substantial larger area on the right leg. This does not have an underlying open area. There is no blistering. The patient tells me that 2 years ago in [place] in response to the rash on his legs he saw a dermatologist who told him he had a condition which may be pyoderma gangrenosum although I may be putting words into his mouth. He seemed to recognize this. On further questioning he admits to a 5 year history of quiesced. ulcerative colitis. He is not in any treatment for this. He's had no recent travel 12/11/16; the patient arrives today with his wound and roughly the same condition we've been using silver alginate this is a deep punched out wound with some surrounding erythema but no tenderness. Biopsy I did did not show  confirmed pyoderma gangrenosum  suggested nonspecific inflammation and vasculitis but does not provide an actual description of what was seen by the pathologist. I'm really not able to understand this We have also received information from the patient's dermatologist in [place] notes from April 2016. This was a doctor Agarwal-antal. 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 He was prescribed topical steroid high potency under occlusion which helped but at this point the patient did not have a deep punched out wound. 12/18/16; the patient's wound is larger in terms of surface area however this surface looks better and there is less depth. The surrounding erythema also is better. The patient states that the wrap we put on came off 2 days ago when he has been using his compression stockings. He we are in the process of getting a dermatology consult. 12/26/16 on evaluation today patient's left lower extremity wound shows evidence of infection with surrounding erythema noted. He has been tolerating the dressing changes but states that he has noted more discomfort. There is a larger area of erythema surrounding the wound. No fevers, chills, nausea, or vomiting noted at this time. With that being said the wound still does have slough covering the surface. He is not allergic to any medication that he is aware of at this point. In regard to his right lower extremity he had several regions that are erythematous and pruritic he wonders if there's anything we can do to help that. 01/02/17 I reviewed patient's wound culture which was obtained his visit last week. He was placed on doxycycline at that point. Unfortunately that does not appear to be an antibiotic that would likely help with the situation however the pseudomonas noted on culture is sensitive to Cipro. Also unfortunately patient's wound seems to have a large compared to last week's evaluation. Not severely so but there are definitely increased measurements in general. He is  continuing to have discomfort as well he writes this to be a seven out of 10. In fact he would prefer me not to perform any debridement today due to the fact that he is having discomfort and considering he has an active infection on the little reluctant to do so anyway. No fevers, chills, nausea, or vomiting noted at this time. 01/08/17; patient seems dermatology on September 5. I suspect dermatology will want the slides from the biopsy I did sent to their pathologist. I'm not sure if there is a way we can expedite that. In any case the culture I did before I left on vacation 3 weeks ago showed Pseudomonas he was given 10 days of Cipro and per her description of her intake nurses is actually somewhat better this week although the wound is quite a bit bigger than I remember the last time I saw this. He still has 3 more days of Cipro 01/21/17; dermatology appointment tomorrow. He has completed the ciprofloxacin for Pseudomonas. Surface of the wound looks better however he is had some deterioration in the lesions on his right leg. Meantime the left lateral leg wound we will continue with sample 01/29/17; patient had his dermatology appointment but I can't yet see that note. He is completed his antibiotics. The wound is more superficial but considerably larger in circumferential area than when he came in. This is in his left lateral calf. He also has swollen erythematous areas with superficial wounds on the right leg and small papular areas on both arms. There apparently areas in her his upper thighs and buttocks I did  not look at those. Dermatology biopsied the right leg. Hopefully will have their input next week. 02/05/17; patient went back to see his dermatologist who told him that he had a "scratching problem" as well as staph. He is now on a 30 day course of doxycycline and I believe she gave him triamcinolone cream to the right leg areas to help with the itching [not exactly sure but  probably triamcinolone]. She apparently looked at the left lateral leg wound although this was not rebiopsied and I think felt to be ultimately part of the same pathogenesis. He is using sample border foam and changing nevus himself. He now has a new open area on the right posterior leg which was his biopsy site I don't have any of the dermatology notes 02/12/17; we put the patient in compression last week with SANTYL to the wound on the left leg and the biopsy. Edema is much better and the depth of the wound is now at level of skin. Area is still the same oBiopsy site on the right lateral leg we've also been using santyl with a border foam dressing and he is changing this himself. 02/19/17; Using silver alginate started last week to both the substantial left leg wound and the biopsy site on the right wound. He is tolerating compression well. Has a an appointment with his primary M.D. tomorrow wondering about diuretics although I'm wondering if the edema problem is actually lymphedema 02/26/17; the patient has been to see his primary doctor Dr. Jerelyn CharlesKathryn Clark at [facility]. She started him on Lasix 20 mg and this seems to have helped with the edema. However we are not making substantial change with the left lateral calf wound and inflammation. The biopsy site on the right leg also looks stable but not really all that different. 03/12/17; the patient has been to see vein and vascular Dr. Wyn QuakerEW. He has had venous reflux studies I have not reviewed these. I did get a call from his dermatology office. They felt that he might have pathergy based on their biopsy on his right leg which led them to look at the slides of Laray AngerLAMER, Herminio ([id number]) the biopsy I did on the left leg and they wonder whether this represents pyoderma gangrenosum which was the original supposition in a man with ulcerative colitis albeit inactive for many years. They therefore recommended clobetasol and tetracycline i.e.  aggressive treatment for possible pyoderma gangrenosum. 03/26/17; apparently the patient just had reflux studies not an appointment with Dr. dew. She arrives in clinic today having applied clobetasol for 2-3 weeks. He notes over the last 2-3 days excessive drainage having to change the dressing 3-4 times a day and also expanding erythema. 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 who kindly saw him at my request. 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 My concern was the degree of erythema medially extending from his left leg wound which was either inflammation from pyoderma or cellulitis. I put him on Augmentin however culture of the wound showed Pseudomonas which is quinolone sensitive. I really don't believe he has cellulitis however in view of everything I will continue and give him a  course of Cipro. He is also on doxycycline as an immune modulator for the pyoderma. In addition to his original wound on the left lateral leg with surrounding erythema he has a wound on the right posterior calf which was an original biopsy site done by dermatology. 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 Saw Dr. Carles Collet yesterday. He has been using topical antibiotics to both wound areas his original wound on the left and the biopsies/pathergy area on the right. There is definitely some improvement in the inflammation around the wound on the right although the patient states he has increasing sensitivity of the wounds. He is on prednisone 60 and doxycycline 1 as prescribed by Dr. Carles Collet. He is covering  the topical antibiotic with gauze and putting this in his own compression stocks and changing this daily. He states that Dr. Aquilla Hacker did a culture of the left leg wound yesterday 05/07/17; pyoderma gangrenosum. The patient saw Dr. Carles Collet yesterday and has a follow-up with her in one month. He is still using topical antibiotics to both wounds although he can't recall exactly what type. He is still on prednisone 60 mg. Dr. Carles Collet stated that the doxycycline could stop if we were in agreement. 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 The right medial leg was induced by biopsy/pathergy. The area on the right is essentially healed. 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 The right medial leg has closed and remains closed. He is still on prednisone 60. oHe tells me he missed his last dermatology appointment with Dr. Carles Collet but will make another appointment. He reports that her blood sugar at a recent screen in [place] was high 200's. He was 180 today. 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 I think he is going to require still much longer prednisone perhaps another 3 months before attempting to taper. In the meantime his wound is a lot better. Smaller. He is cleaning this off daily and applying topical antibiotics. When he was last in the clinic I thought about changing to [facility] and actually put in a couple of calls to dermatology although probably not during their business hours. 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 He continues on oral prednisone. He has been using triple antibiotic ointment. There is surface debris and we will transition to  [facility] and have him return in 2 weeks. He has lost 30 pounds since his last appointment with lifestyle modification. He may benefit from topical steroid cream for treatment this can be considered at a later date. 08/22/17 on evaluation today patient appears to actually be doing rather well in regard to his left lateral lower extremity ulcer. He has actually been managed by Dr. Leanord Hawking most recently. Patient is currently on oral steroids at this time. This seems to have been of benefit for him. Nonetheless his last visit was actually with Leah on 08/06/17. Currently he is not utilizing any topical steroid creams although this could be of benefit as well. No fevers, chills, nausea, or vomiting noted at this time. 09/05/17 on evaluation today patient appears to be doing better in regard to his left lateral lower extremity ulcer.  He has been tolerating the dressing changes without complication. He is using Santyl with good effect. Overall I'm very pleased with how things are standing at this point. Patient likewise is happy that this is doing better. 09/19/17 on evaluation today patient actually appears to be doing rather well in regard to his left lateral lower extremity ulcer. 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 He's not having any significant pain. 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 He has been tolerating the Santyl without complication and in general I feel like he's making good progress. 10/17/17 on evaluation today patient appears to be doing very well in regard to his left lateral lower surety ulcer. He has been tolerating the dressing changes without complication. There does not appear to be any evidence of infection he's alternating the Santyl and the triple antibiotic ointment every other day this seems  to be doing well for him. 11/03/17 on evaluation today patient appears to be doing very well in regard to his left lateral lower extremity ulcer. He is been tolerating the dressing changes without complication which is good news. Fortunately there does not appear to be any evidence of infection which is also great news. Overall is doing excellent they are starting to taper down on the prednisone is down to 40 mg at this point it also started topical clobetasol for him. 11/17/17 on evaluation today patient appears to be doing well in regard to his left lateral lower surety ulcer. He's been tolerating the dressing changes without complication. He does note that he is having no pain, no excessive drainage or discharge, and overall he feels like things are going about how he would expect and hope they would. Overall he seems to have no evidence of infection at this time in my opinion which is good news. 12/04/17-He is seen in follow-up evaluation for right lateral lower extremity ulcer. He has been applying topical steroid cream. Today's measurement show slight increase in size. Over the next 2 weeks we will transition to every other day Santyl and steroid cream. He has been encouraged to monitor for changes and notify clinic with any concerns 12/15/17 on evaluation today patient's left lateral motion the ulcer and fortunately is doing worse again at this point. This just since last week to this week has close to doubled in size according to the patient. I did not seeing last week's I do not have a visual to compare this to in our system was also down so we do not have all the charts and at this point. Nonetheless it does have me somewhat concerned in regard to the fact that again he was worried enough about it he has contact the dermatology that placed them back on the full strength, 50 mg a day of the prednisone that he was taken previous. He continues to alternate using clobetasol along with Santyl at this  point. He is obviously somewhat frustrated. 12/22/17 on evaluation today patient appears to be doing a little worse compared to last evaluation. Unfortunately the wound is a little deeper and slightly larger than the last week's evaluation. With that being said he has made some progress in regard to the irritation surrounding at this time unfortunately despite that progress that's been made he still has a significant issue going on here. I'm not certain that  he is having really any true infection at this time although with the Pyoderma gangrenosum it can sometimes be difficult to differentiate infection versus just inflammation. ADREN, BISSONNETTE ([id number]) For that reason I discussed with him today the possibility of perform a wound culture to ensure there's nothing overtly infected. 01/06/18 on evaluation today patient's wound is larger and deeper than previously evaluated. With that being said it did appear that his wound was infected after my last evaluation with him. Subsequently I did end up prescribing a prescription for Bactrim DS which she has been taking and having no complication with. Fortunately there does not appear to be any evidence of infection at this point in time as far as anything spreading, no want to touch, and overall I feel like things are showing signs of improvement. 01/13/18 on evaluation today patient appears to be even a little larger and deeper than last time. There still muscle exposed in the base of the wound. Nonetheless he does appear to be less erythematous I do believe inflammation is calming down also believe the infection looks like it's probably resolved at this time based on what I'm seeing. No fevers, chills, nausea, or vomiting noted at this time. 01/30/18 on evaluation today patient actually appears to visually look better for the most part. Unfortunately those visually this looks better he does seem to potentially have what may be an abscess in the muscle that has  been noted in the central portion of the wound. This is the first time that I have noted what appears to be fluctuance in the central portion of the muscle. With that being said I'm somewhat more concerned about the fact that this might indicate an abscess formation at this location. I do believe that an ultrasound would be appropriate. This is likely something we need to try to do as soon as possible. He has been switch to mupirocin ointment and he is no longer using the steroid ointment as prescribed by dermatology he sees them again next week he's been decreased from 60 to 40 mg of prednisone. 03/09/18 on evaluation today patient actually appears to be doing a little better compared to last time I saw him. There's not as much erythema surrounding the wound itself. He I did review his most recent infectious disease note which was dated 02/24/18. He saw Dr. Michel Bickers in [place]. 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 Patient is now off of antibiotics and they are continually observing this. There seems to be no change in the past two weeks in my pinion based on what the patient says and what I see today compared to what Dr. Megan Salon likely saw two weeks ago. No fevers, chills, nausea, or vomiting noted at this time. 03/23/18 on evaluation today patient's wound actually appears to be showing signs of improvement which is good news. He is currently still on the Dapsone. He is also working on tapering the prednisone to get off of this and Dr. Lottie Rater is working with him in this regard. Nonetheless overall I feel like the wound is doing well it does appear based on the infectious disease note that I reviewed from Dr. 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 I did also review the results of  his ultrasound of left lower  extremity which revealed there was a dentist tissue in the base of the wound without an abscess noted. 04/06/18 on evaluation today the patient's left lateral lower extremity ulcer actually appears to be doing fairly well which is excellent news. There does not appear to be any evidence of infection at this time which is also great news. Overall he still does have a significantly large ulceration although little by little he seems to be making progress. He is down to 10 mg a day of the prednisone. 04/20/18 on evaluation today patient actually appears to be doing excellent at this time in regard to his left lower extremity ulcer. He's making signs of good progress unfortunately this is taking much longer than we would really like to see but nonetheless he is making progress. Fortunately there does not appear to be any evidence of infection at this time. No fevers, chills, nausea, or vomiting noted at this time. The patient has not been using the Santyl due to the cost he hadn't got in this field yet. He's mainly been using the antibiotic ointment topically. Subsequently he also tells me that he really has not been scrubbing in the shower I think this would be helpful again as I told him it doesn't have to be anything too aggressive to even make it believe just enough to keep it free of some of the loose slough and biofilm on the wound surface. 05/11/18 on evaluation today patient's wound appears to be making slow but sure progress in regard to the left lateral lower extremity ulcer. He is been tolerating the dressing changes without complication. Fortunately there does not appear to be any evidence of infection at this time. He is still just using triple antibiotic ointment along with clobetasol occasionally over the area. He never got the Santyl and really does not seem to intend to in my pinion. 06/01/18 on evaluation today patient appears to be doing a little better in  regard to his left lateral lower extremity ulcer. He states that overall he does not feel like he is doing as well with the Dapsone as he did with the prednisone. Nonetheless he sees his dermatologist later today and is gonna talk to them about the possibility of going back on the prednisone. Overall again I believe that the wound would be better if you would utilize Santyl but he really does not seem to be interested in going back to the [place] at this point. He has been using triple antibiotic ointment. 06/15/18 on evaluation today patient's wound actually appears to be doing about the same at this point. Fortunately there is no signs of infection at this time. He has made slight improvements although he continues to not really want to clean the wound bed at this point. He states that he just doesn't mess with it he doesn't want to cause any problems with everything else he has going on. He has been on medication, antibiotics as prescribed by his dermatologist, for a staff infection of his lower extremities which is really drying out now and looking much better he tells me. Fortunately there is no sign of overall infection. 06/29/18 on evaluation today patient appears to be doing well in regard to his left lateral lower surety ulcer all things considering. Fortunately his staff infection seems to be greatly improved compared to previous. He has no signs of infection and this is drying up quite nicely. He is still the doxycycline for this is no longer on cental, Dapsone, or any of  the other medications. His dermatologist has recommended possibility of an infusion but right now he does not want to proceed with that. 07/13/18 on evaluation today patient appears to be doing about the same in regard to his left lateral lower surety ulcer. Fortunately there's no signs of infection at this time which is great news. Unfortunately he still builds up a significant amount of Slough/biofilm of the surface of  the wound he still is not really cleaning this as he should be appropriately. Again I'm able to easily with saline and gauze remove the majority of this on the surface which if you would do this at home would likely be a dramatic improvement for him as far as getting the area to improve. Nonetheless overall I still feel like he is making progress is just very slow. I think Santyl will be of benefit for him as well. Still he has not gotten this as of this point. 07/27/18 on evaluation today patient actually appears to be doing little worse in regards of the erythema around the periwound region of the wound he also tells me that he's been having more drainage currently compared to what he was experiencing last time I saw him. He states not quite as bad as what he had because this was infected previously but nonetheless is still appears to be doing poorly. Fortunately there is no evidence of systemic infection at this point. The patient tells me that he is not going to be able to afford the Santyl. He is still waiting to hear about the infusion therapy with his dermatologist. Apparently she wants an updated colonoscopy first. 08/10/18 on evaluation today patient appears to be doing better in regard to his left lateral lower extremity ulcer. Fortunately he is showing signs of improvement in this regard he's actually been approved for Remicade infusion's as well although this has not been scheduled as of yet. Fortunately there's no signs of active infection at this time in regard to the wound although he is having some issues with infection of the right lower extremity is been seen as dermatologist for this. Fortunately they are definitely still working with him trying to keep things under control. ABIGAIL, LITFIN ([id number]) 09/07/18 on evaluation today patient is actually doing rather well in regard to his left lateral lower extremity ulcer. He notes these actually having some hair grow back on his extremity  which is something he has not seen in years. He also tells me that the pain is really not giving them any trouble at this time which is also good news overall she is very pleased with the progress he's using a combination of the mupirocin along with the probate is all mixed. 09/21/18 on evaluation today patient actually appears to be doing fairly well all things considered in regard to his looks from the ulcer. He's been tolerating the dressing changes without complication. Fortunately there's no signs of active infection at this time which is good news he is still on all antibiotics or prevention of the staff infection. He has been on prednisone for time although he states it is gonna contact his dermatologist and see if she put them on a short course due to some irritation that he has going on currently. Fortunately there's no evidence of any overall worsening this is going very slow I think cental would be something that would be helpful for him although he states that $50 for tube is quite expensive. He therefore is not willing to get that at this  point. 10/06/18 on evaluation today patient actually appears to be doing decently well in regard to his left lateral leg ulcer. He's been tolerating the dressing changes without complication. Fortunately there's no signs of active infection at this time. Overall I'm actually rather pleased with the progress he's making although it's slow he doesn't show any signs of infection and he does seem to be making some improvement. I do believe that he may need a switch up and dressings to try to help this to heal more appropriately and quickly. 10/19/18 on evaluation today patient actually appears to be doing better in regard to his left lateral lower extremity ulcer. This is shown signs of having much less Slough buildup at this point due to the fact he has been using the [employer]. Obviously this is very good news. The overall size of the wound is not dramatically  smaller but again the appearance is. 11/02/18 on evaluation today patient actually appears to be doing quite well in regard to his lower [place] ulcer. A lot of the skin around the ulcer is actually somewhat irritating at this point this seems to be more due to the dressing causing irritation from the adhesive that anything else. Fortunately there is no signs of active infection at this time. 11/24/18 on evaluation today patient appears to be doing a little worse in regard to his overall appearance of his lower extremity ulcer. There's more erythema and warmth around the wound unfortunately. He is currently on doxycycline which he has been on for some time. With that being said I'm not sure that seems to be helping with what appears to possibly be an acute cellulitis with regard to his left lower extremity ulcer. No fevers, chills, nausea, or vomiting noted at this time. 12/08/18 on evaluation today patient's wounds actually appears to be doing significantly better compared to his last evaluation. He has been using Santyl along with alternating tripling about appointment as well as the steroid cream seems to be doing quite well and the wound is showing signs of improvement which is excellent news. Fortunately there's no evidence of infection and in fact his culture came back negative with only normal skin flora noted. 12/21/2018 upon evaluation today patient actually appears to be doing excellent with regard to his ulcer. This is actually the best that I have seen it since have been helping to take care of him. It is both smaller as well as less slough noted on the surface of the wound and seems to be showing signs of good improvement with new skin growing from the edges. He has been using just the triamcinolone he does wonder if he can get a refill of that ointment today. 01/04/2019 upon evaluation today patient actually appears to be doing well with regard to his left lateral lower extremity ulcer. With  that being said it does not appear to be that he is doing quite as well as last time as far as progression is concerned. There does not appear to be any signs of infection or significant irritation which is good news. With that being said I do believe that he may benefit from switching to a collagen based dressing based on how clean The wound appears. 01/18/2019 on evaluation today patient actually appears to be doing well with regard to his wound on the left lower extremity. He is not made a lot of progress compared to where we were previous but nonetheless does seem to be doing okay at this time which is good  news. There is no signs of active infection which is also good news. My only concern currently is I do wish we can get him into utilizing the collagen dressing his insurance would not pay for the supplies that we ordered although it appears that he may be able to order this through his supply company that he typically utilizes. This is [employer]. Nonetheless he did try to order it during the office visit today and it appears this did go through. We will see if he can get that it is a different brand but nonetheless he has collagen and I do think will be beneficial. 02/01/2019 on evaluation today patient actually appears to be doing a little worse today in regard to the overall size of his wounds. Fortunately there is no signs of active infection at this time. That is visually. Nonetheless when this is happened before it was due to infection. For that reason were somewhat concerned about that this time as well. 02/08/2019 on evaluation today patient unfortunately appears to be doing slightly worse with regard to his wound upon evaluation today. Is measuring a little deeper and a little larger unfortunately. I am not really sure exactly what is causing this to enlarge he actually did see his dermatologist she is going to see about initiating Humira for him. Subsequently she also did do steroid  injections into the wound itself in the periphery. Nonetheless still nonetheless he seems to be getting a little bit larger he is gone back to just using the steroid cream topically which I think is appropriate. I would say hold off on the collagen for the time being is definitely a good thing to do. Based on the culture results which we finally did get the final result back regarding it shows staph as the bacteria noted again that can be a normal skin bacteria based on the fact however he is having increased drainage and worsening of the wound measurement wise I would go ahead and place him on an antibiotic today I do believe for this. 02/15/2019 on evaluation today patient actually appears to be doing somewhat better in regard to his ulcer. There is no signs of worsening at this time I did review his culture results which showed evidence of Staphylococcus aureus but not MRSA. Again this could just be more related to the normal skin bacteria although he states the drainage has slowed down quite a bit he may have had a mild infection not just colonization. And was much smaller and then since around10/04/2019 on evaluation today patient appears to be doing unfortunately worse as far as the size of the wound. I really feel like that this is steadily getting larger again it had been doing excellent right at the beginning of September we have seen a steady increase in the area of the wound it is almost 2-1/2 times the size it was on September 1. Obviously this is a bad trend this is not wanting to see. For that reason we went back to using just the topical triamcinolone cream which does seem to help with inflammation. I checked him for bacteria by way of culture and nothing showed positive there. I am considering giving him a short course of a tapering steroid Dosepak today to see if that is can be beneficial for him. The patient is in agreement with giving that a try. 03/08/2019 on evaluation today  patient appears to be doing very well in comparison to last evaluation with regard to his lower extremity ulcer.  This is showing signs of less inflammation and actually measuring slightly smaller compared to last time every other week over the past month and a half he has been measuring larger larger larger. Nonetheless I do believe that the issue has been inflammation the prednisone does seem to [facility] ([id number]) have been beneficial for him which is good news. No fevers, chills, nausea, vomiting, or diarrhea. 03/22/2019 on evaluation today patient appears to be doing about the same with regard to his leg ulcer. He has been tolerating the dressing changes without complication. With that being said the wound seems to be mostly arrested at its current size but really is not making any progress except for when we prescribed the prednisone. He did show some signs of dropping as far as the overall size of the wound during that interval week. Nonetheless this is something he is not on long-term at this point and unfortunately I think he is getting need either this or else the Humira which his dermatologist has discussed try to get approval for. With that being said he will be seeing his dermatologist on the 11th of this month that is November. 04/19/2019 on evaluation today patient appears to be doing really about the same the wound is measuring slightly larger compared to last time I saw him. He has not been into the office since November 2 due to the fact that he unfortunately had Covid as that his entire family. He tells me that it was rough but they did pull-through and he seems to be doing much better. Fortunately there is no signs of active infection at this time. No fevers, chills, nausea, vomiting, or diarrhea. 05/10/2019 on evaluation today patient unfortunately appears to be doing significantly worse as compared to last time I saw him. He does tell me that he has had his first dose of  Humira and actually is scheduled to get the next one in the upcoming week. With that being said he tells me also that in the past several days he has been having a lot of issues with green drainage she showed me a picture this is more blue-green in color. He is also been having issues with increased sloughy buildup and the wound does appear to be larger today. Obviously this is not the direction that we want everything to take based on the starting of his Humira. Nonetheless I think this is definitely a result of likely infection and to be honest I think this is probably Pseudomonas causing the infection based on what I am seeing. 05/24/2019 on evaluation today patient unfortunately appears to be doing significantly worse compared to his prior evaluation with me 2 weeks ago. I did review his culture results which showed that he does have Staph aureus as well as Pseudomonas noted on the culture. Nonetheless the Levaquin that I prescribed for him does not appear to have been appropriate and in fact he tells me he is no longer experiencing the green drainage and discharge that he had at the last visit. Fortunately there is no signs of active infection at this time which is good news although the wound has significantly worsened it in fact is much deeper than it was previous. We have been utilizing up to this point triamcinolone ointment as the prescription topical of choice but at this time I really feel like that the wound is getting need to be packed in order to appropriately manage this due to the deeper nature of the wound. Therefore something along the  lines of an alginate dressing may be more appropriate. 05/31/2019 upon inspection today patient's wound actually showed signs of doing poorly at this point. Unfortunately he just does not seem to be making any good progress despite what we have tried. He actually did go ahead and pick up the Cipro and start taking that as he was noticing more green  drainage he had previously completed the Levaquin that I prescribed for him as well. Nonetheless he missed his appointment for the seventh last week on Wednesday with the wound care center and [facility] where his dermatologist referred him. Obviously I do think a second opinion would be helpful at this point especially in light of the fact that the patient seems to be doing so poorly despite the fact that we have tried everything that I really know how at this point. The only thing that ever seems to have helped him in the past is when he was on high doses of continual steroids that did seem to make a difference for him. 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 06/07/2019 upon evaluation today patient unfortunately appears to be doing worse yet again with regard to his wound. In fact I am starting to question whether or not he may have a fluid pocket in the muscle at this point based on the bulging and the soft appearance to the central portion of the muscle area. There is not anything draining from the muscle itself at this time which is good news but nonetheless the wound is expanding. I am not really seeing any results of the Humira as far as overall wound progression based on what I am seeing at this point. The patient has been referred for second opinion with regard to his wound to the [facility] wound care center by his dermatologist which I definitely am not in opposition to. Unfortunately we tried multiple dressings in the past including collagen, alginate, and at one point even Hydrofera Blue. With that being said he is never really used it for any significant amount of time due to the fact that he often complains of pain associated with these dressings and then will go back to either using the Santyl which she has done intermittently or  more frequently the triamcinolone. He is also using his own compression stockings. We have wrapped him in the past but again that was something else that he really was not a big fan of. 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 He seemed to do much better when he was on the steroids although honestly I understand that the steroids are not necessarily the best medication to be on long-term obviously 06/14/2019 on evaluation today patient appears to be doing actually a little bit better with regard to the overall appearance with his leg. Unfortunately he does continue to have issues with what appears to be some fluid underneath the muscle although he did see the wound specialty center at [facility] last week  their main goals were to see about infusion therapy in place of the Humira as they feel like that is not quite strong enough. They also recommended that we continue with the treatment otherwise as we are they felt like that was appropriate and they are okay with him continuing to follow-up here with [place] in that regard. With that being said they are also sending him to the vein specialist there to see about vein stripping and if that would be of benefit for him. Subsequently they also did not really address whether or not an ultrasound of the muscle area to see if there is anything that needs to be addressed here would be appropriate or not. For that reason I discussed this with him last week I think we may proceed down that road at this point. 06/21/2019 upon evaluation today patient's wound actually appears to be doing slightly better compared to previous evaluations. I do believe that he  has made a difference with regard to the progression here with the use of oral steroids. Again in the past has been the only thing that is really calm things down. He does tell me that from [facility] is gotten a good news from there that there are no further vein stripping that is necessary at this point. I do not have that available for review today although the patient did relay this to me. He also did obtain and have the ultrasound of the wound completed which I did sign off on today. It does appear that there is no fluid collection under the muscle this is likely then just edematous tissue in general. That is also good news. Overall I still believe the inflammation is the main issue here. He did inquire about the possibility of a wound VAC again with the muscle protruding like it is I am not really sure whether the wound VAC is necessarily ideal or not. That is something we will have to consider although I do believe he may need compression wrapping to try to help with edema control which could potentially be of benefit. 06/28/2019 on evaluation today patient appears to be doing slightly better measurement wise although this is not terribly smaller he least seems to be trending towards that direction. With that being said he still seems to have purulent drainage noted in the wound bed at this time. He has been on Levaquin followed by Cipro over the past month. Unfortunately he still seems to have some issues with active infection at this time. I did perform a culture last week in order to evaluate and see if indeed there was still anything going on. Subsequently the culture did come back showing Pseudomonas which is consistent with the drainage has been having which is blue-green in color. He also has had an odor that again was somewhat consistent with Pseudomonas as well. Long story short it appears that the culture showed an intermediate finding with regard to how well the Cipro will work for the  Pseudomonas infection. Subsequently being that he does not seem to be clearing up and at best what we are doing is just keeping this at [place] I think he may need to see infectious disease to discuss IV antibiotic options. NEUMAN, JIAN ([id number]) 07/05/2019 upon evaluation today patient appears to be doing okay in regard to his leg ulcer. He has been tolerating the dressing changes at this point without complication. Fortunately there is no signs of active infection at this time which is good news. No fevers, chills,  nausea, vomiting, or diarrhea. With that being said he does have an appointment with infectious disease tomorrow and his primary care on Wednesday. Again the reason for the infectious disease referral was due to the fact that he did not seem to be fully resolving with the use of oral antibiotics and therefore we were thinking that IV antibiotic therapy may be necessary secondary to the fact that there was an intermediate finding for how effective the Cipro may be. Nonetheless again he has been having a lot of purulent and even green drainage. Fortunately right now that seems to have calmed down over the past week with the reinitiation of the oral antibiotic. Nonetheless we will see what Dr. Megan Salon has to say. 07/12/2019 upon evaluation today patient appears to be doing about the same at this point in regard to his left lower extremity ulcer. Fortunately there is no signs of active infection at this time which is good news I do believe the Levaquin has been beneficial I did review Dr. Hale Bogus note and to be honest I agree that the patient's leg does appear to be doing better currently. What we found in the past as he does not seem to really completely resolve he will stop the antibiotic and then subsequently things will revert back to having issues with blue-green drainage, increased pain, and overall worsening in general. Obviously that is the reason I sent him back to infectious  disease. 07/19/2019 upon evaluation today patient appears to be doing roughly the same in size there is really no dramatic improvement. He has started back on the Levaquin at this point and though he seems to be doing okay he did still have a lot of blue/green drainage noted on evaluation today unfortunately. I think that this is still indicative more likely of a Pseudomonas infection as previously noted and again he does see Dr. Megan Salon in just a couple of days. I do not know that were really able to effectively clear this with just oral antibiotics alone based on what I am seeing currently. Nonetheless we are still continue to try to manage as best we can with regard to the patient and his wound. I do think the wrap was helpful in decreasing the edema which is excellent news. No fevers, chills, nausea, vomiting, or diarrhea. 07/26/2019 upon evaluation today patient appears to be doing slightly better with regard to the overall appearance of the muscle there is no dark discoloration centrally. Fortunately there is no signs of active infection at this time. No fevers, chills, nausea, vomiting, or diarrhea. Patient's wound bed currently the patient did have an appointment with Dr. Megan Salon at infectious disease last week. With that being said Dr. Megan Salon the patient states was still somewhat hesitant about put him on any IV antibiotics he wanted [place] to repeat cultures today and then see where things go going forward. He does look like Dr. Megan Salon because of some improvement the patient did have with the Levaquin wanted [place] to see about repeating cultures. If it indeed grows the Pseudomonas again then he recommended a possibility of considering a PICC line placement and IV antibiotic therapy. He plans to see the patient back in 1 to 2 weeks. 08/02/2019 upon evaluation today patient appears to be doing poorly with regard to his left lower extremity. We did get the results of his culture back it shows that he  is still showing evidence of Pseudomonas which is consistent with the purulent/blue-green drainage that he has currently. Subsequently the culture  also shows that he now is showing resistance to the oral fluoroquinolones which is unfortunate as that was really the only thing to treat the infection prior. I do believe that he is looking like this is going require IV antibiotic therapy to get this under control. Fortunately there is no signs of systemic infection at this time which is good news. The patient does see Dr. Megan Salon tomorrow. 08/09/2019 upon evaluation today patient appears to be doing better with regard to his left lower extremity ulcer in regard to the overall appearance. He is currently on IV antibiotic therapy. As ordered by Dr. Megan Salon. Currently the patient is on ceftazidime which she is going to take for the next 2 weeks and then follow-up for 4 to 5-week appointment with Dr. Megan Salon. The patient started this this past Friday symptoms have not for a total of 3 days currently in full. 08/16/2019 upon evaluation today patient's wound actually does show muscle in the base of the wound but in general does appear to be much better as far as the overall evidence of infection is concerned. In fact I feel like this is for the most part cleared up he still on the IV antibiotics he has not completed the full course yet but I think he is doing much better which is excellent news. 08/23/2019 upon evaluation today patient appears to be doing about the same with regard to his wound at this point. He tells me that he still has pain unfortunately. Fortunately there is no evidence of systemic infection at this time which is great news. There is significant muscle protrusion. 09/13/19 upon evaluation today patient appears to be doing about the same in regard to his leg unfortunately. He still has a lot of drainage coming from the ulceration there is still muscle exposed. With that being said the patient's  last wound culture still showed an intermediate finding with regard to the Pseudomonas he still having the bluish/green drainage as well. Overall I do not know that the wound has completely cleared of infection at this point. Fortunately there is no signs of active infection systemically at this point which is good news. 09/20/2019 upon evaluation today patient's wound actually appears to be doing about the same based on what I am seeing currently. I do not see any signs of systemic infection he still does have evidence of some local infection and drainage. He did see Dr. Megan Salon last week and Dr. Megan Salon states that he probably does need a different IV antibiotic although he does not want to put him on this until the patient begins the Remicade infusion which is actually scheduled for about 10 days out from today on 13 May. Following that time Dr. Megan Salon is good to see him back and then will evaluate the feasibility of starting him on the IV antibiotic therapy once again at that point. I do not disagree with this plan I do believe as Dr. Megan Salon stated in his note that I reviewed today that the patient's issue is multifactorial with the pyoderma being 1 aspect of this that were hoping the Remicade will be helpful for her. In the meantime I think the gentamicin is, helping to keep things under decent okay control in regard to the ulcer. 09/27/2019 upon evaluation today patient appears to be doing about the same with regard to his wound still there is a lot of muscle exposure though he does have some hyper granulation tissue noted around the edge and actually some granulation tissue starting  to form over the muscle which is actually good news. Fortunately there is no evidence of active infection which is also good news. His pain is less at this point. 5/21; this is a patient I have not seen in a long time. 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 He has a large  wound on the left lateral leg with protruding muscle. He comes in the clinic today showing the same area on his left medial ankle. He says there is been a spot there for some time although we have not previously defined this. Today he has a clearly defined area with slight amount of skin breakdown surrounded by raised areas with a purplish hue in color. This is not painful he says it is irritated. 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 He still has muscle protruding from the lateral portion of his left leg but fortunately the new area that they were concerned about at his last visit does not appear to have opened at this point. He is currently on Remicade infusions and seems to be doing better in my opinion in fact the wound itself seems to be overall much better. The purplish discoloration that he did have seems to have resolved and I think that is a good sign that hopefully the Remicade is doing its job. He does have some biofilm noted over the surface of the wound. 11/01/2019 on evaluation today patient's wound actually appears to be doing excellent at this time. Fortunately there is no evidence of active infection and overall I feel like he is making great progress. The Remicade seems to be due excellent job in my opinion. NICKEY, BECK ([id number]) 11/08/19 evaluation today vision actually appears to be doing quite well with regard to his weight ulcer. He's been tolerating dressing changes without complication. Fortunately there is no evidence of infection. No fevers, chills, nausea, or vomiting noted at this time. Overall states that is having more itching than pain which is actually a good sign in my opinion. 12/13/2019 upon evaluation today patient appears to be doing well today with regard to his wound. He has been tolerating the dressing changes without complication. Fortunately  there is no sign of active infection at this time. No fevers, chills, nausea, vomiting, or diarrhea. Overall I feel like the infusion therapy has been very beneficial for him. 01/06/2020 on evaluation today patient appears to be doing well with regard to his wound. This is measuring smaller and actually looks to be doing better. Fortunately there is no signs of active infection at this point. No fevers, chills, nausea, vomiting, or diarrhea. With that being said he does still have the blue-green drainage but this does not seem to be causing any significant issues currently. He has been using the gentamicin that does seem to be keeping things under decent control at this point. He goes later this morning for his next infusion therapy for the pyoderma which seems to also be very beneficial. 02/07/2020 on evaluation today patient appears to be doing about the same in regard to his wounds currently. Fortunately there is no signs of active infection systemically he does still have evidence of local infection still using gentamicin. He also is showing some signs of improvement albeit slowly I do feel like we are making some progress here. 02/21/2020 upon evaluation today patient appears to be making some signs of improvement the wound is measuring  a little bit smaller which is great news and overall I am very pleased with where he stands currently. He is going to be having infusion therapy treatment on the 15th of this month. Fortunately there is no signs of active infection at this time. 03/13/2020 I do believe patient's wound is actually showing some signs of improvement here which is great news. He has continue with the infusion therapy through rheumatology/dermatology at [facility]. That does seem to be beneficial. I still think he gets as much benefit from this as he did from the prednisone initially but nonetheless obviously this is less harsh on his body that the prednisone as far as they are  concerned. 03/31/2020 on evaluation today patient's wound actually showing signs of some pretty good improvement in regard to the overall appearance of the wound bed. There is still muscle exposed though he does have some epithelial growth around the edges of the wound. Fortunately there is no signs of active infection at this time. No fevers, chills, nausea, vomiting, or diarrhea. 04/24/2020 upon evaluation today patient appears to be doing about the same in regard to his leg ulcer. He has been tolerating the dressing changes without complication. Fortunately there is no signs of active infection at this time. No fevers, chills, nausea, vomiting, or diarrhea. With that being said he still has a lot of irritation from the bandaging around the edges of the wound. We did discuss today the possibility of a referral to plastic surgery. 05/22/2020 on evaluation today patient appears to be doing well with regard to his wounds all things considered. He has not been able to get the Chantix apparently there is a recall nurse that I was unaware of put out by [employer] involuntarily. Nonetheless for now I am and I have to do some research into what may be the best option for him to help with quitting in regard to smoking and we discussed that today. 06/26/2020 upon evaluation today patient appears to be doing well with regard to his wound from the standpoint of infection I do not see any signs of infection at this point. With that being said unfortunately he is still continuing to have issues with muscle exposure and again he is not having a whole lot of new skin growth unfortunately. There does not appear to be any signs of active infection at this time. No fevers, chills, nausea, vomiting, or diarrhea. 07/10/2020 upon evaluation today patient appears to be doing a little bit more poorly currently compared to where he was previous. I am concerned currently about an active infection that may be getting worse especially  in light of the increased size and tenderness of the wound bed. No fevers, chills, nausea, vomiting, or diarrhea. 07/24/2020 upon evaluation today patient appears to be doing poorly in regard to his leg ulcer. He has been tolerating the dressing changes without complication but unfortunately is having a lot of discomfort. 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 Subsequently I think he is going require possibly IV antibiotics to get this under control. I am very concerned about the severity of his infection and the amount of discomfort he is having. 07/31/2020 upon evaluation today patient appears to be doing about the same in regard to his leg wound. He did see Dr. Megan Salon and Dr. Megan Salon is actually going to start him on IV antibiotics. He goes for the PICC line tomorrow. With that being said there do not have that run  for 2 weeks and then see how things are doing and depending on how he is progressing they may extend that a little longer. Nonetheless I am glad this is getting ready to be in place and definitely feel it may help the patient. In the meantime is been using mainly triamcinolone to the wound bed has an anti-inflammatory. 08/07/2020 on evaluation today patient appears to be doing well with regard to his wound compared even last week. In the interim he has gotten the PICC line placed and overall this seems to be doing excellent. There does not appear to be any evidence of infection which is great news systemically although locally of course has had the infection this appears to be improving with the use of the antibiotics. 08/14/2020 upon evaluation today patient's wound actually showing signs of excellent improvement. Overall the irritation has significantly improved the drainage is back down to more of a normal level and his pain is really pretty much nonexistent compared to what it was. Obviously I think that this is  significantly improved secondary to the IV antibiotic therapy which has made all the difference in the world. Again he had a resistant form of Pseudomonas for which oral antibiotics just was not cutting it. Nonetheless I do think that still we need to consider the possibility of a surgical closure for this wound is been open so long and to be honest with muscle exposed I think this can be very hard to get this to close outside of this although definitely were still working to try to do what we can in that regard. 08/21/2020 upon evaluation today patient appears to be doing very well with regard to his wounds on the left lateral lower extremity/calf area. Fortunately there does not appear to be signs of active infection which is great news and overall very pleased with where things stand today. He is actually wrapping up his treatment with IV antibiotics tomorrow. After that we will see where things go from there. 08/28/2020 upon evaluation today patient appears to be doing decently well with regard to his leg ulcer. There does not appear to be any signs of active infection which is great news and overall very pleased with where things stand today. No fevers, chills, nausea, vomiting, or diarrhea. 09/18/2020 upon evaluation today patient appears to be doing well with regard to his infection which I feel like is better. Unfortunately he is not doing as well with regard to the overall size of the wound which is not nearly as good at this point. I feel like that he may be having an issue here with the pyoderma being somewhat out of control. I think that he may benefit from potentially going back and talking to the dermatologist about JAXZEN, ENTIN ([id number]) what to do from the pyoderma standpoint. I am not certain if the infusions are helping nearly as much is what the prednisone did in the past. 10/02/2020 upon evaluation today patient appears to be doing well with regard to his leg ulcer. He did go to the  Psychiatric nurse. Unfortunately they feel like there is a 10% chance that most that he would be able to heal and that the skin graft would take. Obviously this has led him to not be able to go down that path as far as treatment is concerned. Nonetheless he does seem to be doing a little bit better with the prednisone that I gave him last time. I think that he may need to discuss with dermatology  the possibility of long-term prednisone as that seems to be what is most helpful for him to be perfectly honest. I am not sure the Remicade is really doing the job. 10/17/2020 upon evaluation today patient appears to be doing a little better in regard to his wound. In fact the case has been since we did the prednisone on May 2 for him that we have noticed a little bit of improvement each time we have seen a size wise as well as appearance wise as well as pain wise. I think the prednisone has had a greater effect then the infusion therapy has to be perfectly honest. With that being said the patient also feels significantly better compared to what he was previous. All of this is good news but nonetheless I am still concerned about the fact that again we are really not set up to long-term manage him as far as prednisone is concerned. Obviously there are things that you need to be watched I completely understand the risk of prednisone usage as well. 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 With all that being said I do believe that we can give him another round of the prednisone which she is requesting today because of the improvement that he seen since we did that first round. 10/30/2020 upon evaluation today patient's wound actually is showing signs of doing quite well. There does not appear to be any evidence of infection which is great news and overall very pleased with where things stand today. No fevers, chills, nausea, vomiting, or diarrhea. He tells me that the  prednisone still has seem to have helped he wonders if we can extend that for just a little bit longer. He did not have the appointment with a dermatologist although he did have an infusion appointment last Friday. That was at [facility]. With that being said he tells me he could not do both that as well as the appointment with the physician on the same day therefore that is can have to be rescheduled. I really want to see if there is anything they feel like that could be done differently to try to help this out as I am not really certain that the infusions are helping significantly here. 11/13/2020 upon evaluation today patient unfortunately appears to be doing somewhat poorly in regard to his wound I feel like this is actually worsening from the standpoint of the pyoderma spreading. I still feel like that he may need something different as far as trying to manage this going forward. Again we did the prednisone unfortunately his blood sugars are not doing so well because of this. Nonetheless I believe that the patient likely needs to try topical steroid. We have done triamcinolone for a while I think going with something stronger such as clobetasol could be beneficial again this is not something I do lightly I discussed this with the patient that again this does not normally put underneath an occlusive dressing. Nonetheless I think a thin film as such could help with some of the stronger anti-inflammatory effects. We discussed this today. He would like to try to give this a trial for the next couple weeks. I definitely think that is something that we can do. Evaluate7/03/2021 and today patient's wound bed actually showed signs of doing really about the same. There was a little expansion of the size of the wound and that leading edge that we done looking out although the clobetasol does  seem to have slowed this down a bit in my opinion. There is just 1 small area that still seems to be progressing based on  what I see. Nonetheless I am concerned about the fact this does not seem to be improving if anything seems to be doing a little bit worse. I do not know that the infusions are really helping him much as next infusion is August 5 his appointment with dermatology is July 25. Either way I really think that we need to have a conversation potentially about this and I am actually going to see if I can talk with Dr. Lillia Carmel in order to see where things stand as well. 12/11/2020 upon evaluation today patient appears to be doing worse in regard to his leg ulcer. Unfortunately I just do not think this is making the progress that I would like to see at this point. Honestly he does have an appointment with dermatology and this is in 2 days. I am wondering what they may have to offer to help with this. Right now what I am seeing is that he is continuing to show signs of worsening little by little. Obviously that is not great at all. Is the exact opposite of what we are looking for. 12/18/2020 upon evaluation today patient appears to be doing a little better in regard to his wound. The dermatologist actually did do some steroid injections into the wound which does seem to have been beneficial in my opinion. That was on the 25th already this looks a little better to me than last time I saw him. With that being said we did do a culture and this did show that he has Staph aureus noted in abundance in the wound. With that being said I do think that getting him on an oral antibiotic would be appropriate as well. Also think we can compression wrap and this will make a difference as well. 12/28/2020 upon evaluation today patient's wound is actually showing signs of doing much better. I do believe the compression wrap is helping he has a lot of drainage but to be honest I think that the compression is helping to some degree in this regard as well as not draining through which is also good news. No fevers, chills, nausea,  vomiting, or diarrhea. 01/04/2021 upon evaluation today patient appears to be doing well with regard to his wound. Overall things seem to be doing quite well. He did have a little bit of reaction to the CarboFlex Sorbact he will be using that any longer. With that being said he is controlled as far as the drainage is concerned overall and seems to be doing quite well. I do not see any signs of active infection at this time which is great news. No fevers, chills, nausea, vomiting, or diarrhea. 01/11/2021 upon evaluation today patient appears to be doing well with regard to his wounds. He has been tolerating the dressing changes without complication. Fortunately there does not appear to be any signs of active infection at this time which is great news. Overall I am extremely pleased with where we stand currently. No fevers, chills, nausea, vomiting, or diarrhea. Where using clobetasol in the wound bed he has a lot of new skin growth which is awesome as well. 01/18/2021 upon evaluation today patient appears to be doing very well in regard to his leg ulcer. He has been tolerating the dressing changes without complication. Fortunately there does not appear to be any signs of active infection which is  great news. In general I think that he is making excellent progress 01/25/2021 upon evaluation today patient appears to be doing well with regard to his wound on the leg. I am actually extremely pleased with where things stand today. There does not appear to be any signs of active infection which is great news and overall I think that we are definitely headed in the appropriate direction based on what I am seeing currently. There does not appear to be any signs of active infection also excellent news. 02/06/2021 upon evaluation today patient appears to be doing well with regard to his wound. Overall visually this is showing signs of significant improvement which is great news. I do not see any signs of active  infection systemically which is great even locally I do not think that we are seeing any major complications here. We did do fluorescence imaging with the [employer] today. The patient does have some odor and drainage noted and again this is something that I think would benefit him to probably come more frequently for nurse visits. 02/19/2021 upon evaluation today patient actually appears to be doing quite well in regard to his wound. He has been tolerating the dressing Sneath, Demetrick ([id number]) changes without complication and overall I think that this is making excellent progress. I do not see any evidence of active infection at this point which is great news as well. No fevers, chills, nausea, vomiting, or diarrhea. 10/10; wound is made nice progress healthy granulation with a nice rim of epithelialization which seems to be expanding even from last week he has a deeper area in the inferior part of the more distal part of the wound with not quite as healthy as surface. This area will need to be followed. Using clobetasol and Hydrofera Blue 03/05/2021 upon evaluation today patient appears to be doing very well in regard to his leg ulcer. He has been tolerating dressing changes without complication. Fortunately there does not appear to be any signs of active infection which is great news and overall I am extremely pleased with where we stand currently. 03/12/2021 upon evaluation today patient appears to be doing well with regard to his wound in fact this is extremely extremely good based on what we are seeing today there does not appear to be any signs of active infection and overall I think that he is doing awesome from the standpoint of healing in general. I am extremely pleased with how things seem to be progressing with regard to this pyoderma. Clobetasol has done wonders for him. I think the compression wrapping has also been of great benefit. 03/19/2021 upon evaluation today patient appears  to be doing well with regard to his wound. He is tolerating the dressing changes without complication. In fact I feel like that he is actually making excellent progress at this point based on what I am seeing. No fevers, chills, nausea, vomiting, or diarrhea. 03/26/2021 upon evaluation today patient appears to be doing well with regard to his wound. This again is measuring smaller and looking better. Again the progress is slow but nonetheless continual with what we have been seeing. I do believe that the current plan is doing awesome for him. 04/09/2021 upon evaluation today patient appears to be doing well with regard to his leg ulcer. This is showing signs of excellent improvement the muscle is completely closed over and there does not appear to be any evidence of inflammation at this point his drainage is significantly improved. Overall I think that  he would be a good candidate for looking into a skin substitute at this point as well. We will get a look into some approvals in that regard. Potentially TheraSkin as well as Apligraf could both be considered just depending on insurance coverage. 04/16/2021 upon evaluation today patient appears to be doing well at this point. He has been approved for the Apligraf which we could definitely order although I would like to try to get the TheraSkin approved if at all possible. I did fax notes into them today and I Georgina Peer try to give a call as well. Overall the wound appears to be doing decently well today. 04/20/2021 upon evaluation today patient actually appears to be doing quite well in regard to his wound with that being said we are trying to see what we can do about speeding up the healing process. For that reason we did discuss the possibility of a skin substitute. We got the Apligraf approved. For that reason we will get a go ahead and see what we can do with the Apligraf at this point. I am still trying to get the TheraSkin approved but I have not heard  anything from the insurance company yet I have called and talked to them earlier in the week and to be honest this was about half an hour that I spent on the phone they told me I would hear something within 10-15 business days. 04/27/2021 upon evaluation today patient appears to be doing excellent in regard to his wound. I do believe the Apligraf has been beneficial. With that being said he has had a little bit of increased pain has been a little bit concerned. I do want to go ahead and apply his steroid cream today the clobetasol and then put the Apligraf over I just do not think I want to risk not putting the clobetasol on based on what is been going on here currently. Patient voiced understanding. 05/04/2021 upon evaluation today patient is making excellent improvement. Overall there is definitely decrease in the size of the wound today and I am very pleased in that regard. I do not see any signs of active infection locally nor systemically at this point. No fevers, chills, nausea, vomiting, or diarrhea. 05/11/2021 upon evaluation today patient appears to be doing well with regard to his wound. He has been tolerating Apligraf's without complication and is making excellent progress this is application #4 today. 12/30; patient here for Apligraf application. Appears to be doing well small wound there has been a lot of healing Electronic Signature(s) Signed: 05/18/2021 4:08:28 PM By: Linton Ham MD Entered By: Linton Ham on 05/18/2021 14:29:03 SHERRIL, MAZAR ([id number]) -------------------------------------------------------------------------------- Physical Exam Details Patient Name: [name] Date of Service: 05/18/2021 2:00 PM Medical Record Number: [id number] Patient Account Number: [id number] Date of Birth/Sex: 06-12-1980 (40 y.o. M) Treating RN: Carlene Coria Primary Care Provider: [name] Other Clinician: Referring Provider: [name] Treating Provider/Extender:  Tito Dine in Treatment: 50 Constitutional Patient is hypertensive.. Pulse regular and within target range for patient.Marland Kitchen Respirations regular, non-labored and within target range.. Temperature is normal and within the target range for the patient.Marland Kitchen appears in no distress. Notes Wound exam; left lateral leg. Healthy looking granulation. Certainly much better than I remember this although I see in August pictures suggesting that this deteriorated possibly because of infection. No debridement was necessary Apligraf applied in the standard fashion Electronic Signature(s) Signed: 05/18/2021 4:08:28 PM By: Linton Ham MD Entered By: Linton Ham on 05/18/2021 14:30:33 Bubolz,  MEKAI ([id number]) -------------------------------------------------------------------------------- Physician Orders Details Patient Name: TEGHAN, SCHNEEKLOTH Date of Service: 05/18/2021 2:00 PM Medical Record Number: [id number] Patient Account Number: [id number] Date of Birth/Sex: 08-11-1980 (40 y.o. M) Treating RN: Carlene Coria Primary Care Provider: [name] Other Clinician: Referring Provider: [name] Treating Provider/Extender: Tito Dine in Treatment: 20 Verbal / Phone Orders: No Diagnosis Coding ICD-10 Coding Code Description I87.2 Venous insufficiency (chronic) (peripheral) L97.222 Non-pressure chronic ulcer of left calf with fat layer exposed E11.622 Type 2 diabetes mellitus with other skin ulcer L88 Pyoderma gangrenosum F17.208 Nicotine dependence, unspecified, with other nicotine-induced disorders Follow-up Appointments o Return Appointment in 1 week. o Nurse Visit as needed - twice a week Bathing/ Shower/ Hygiene o Clean wound with Normal Saline or wound cleanser. Cellular or Tissue Based Products o Cellular or Tissue Based Product Type: - APLIGRAF #5 o Cellular or Tissue Based Product applied to wound bed; including contact layer, fixation with  steri-strips, dry gauze and cover dressing. (DO NOT REMOVE). Edema Control - Lymphedema / Segmental Compressive Device / Other o Optional: One layer of unna paste to top of compression wrap (to act as an anchor). o Elevate, Exercise Daily and Avoid Standing for Long Periods of Time. o Elevate legs to the level of the heart and pump ankles as often as possible o Elevate leg(s) parallel to the floor when sitting. Wound Treatment Wound #1 - Lower Leg Wound Laterality: Left, Lateral Cleanser: Wound Cleanser 3 x Per Week/30 Days Discharge Instructions: Wash your hands with soap and water. Remove old dressing, discard into plastic bag and place into trash. Cleanse the wound with Wound Cleanser prior to applying a clean dressing using gauze sponges, not tissues or cotton balls. Do not scrub or use excessive force. Pat dry using gauze sponges, not tissue or cotton balls. Peri-Wound Care: Moisturizing Lotion 3 x Per Week/30 Days Discharge Instructions: Suggestions: Theraderm, Eucerin, Cetaphil, or patient preference. Secondary Dressing: Xtrasorb Medium 4x5 (in/in) 3 x Per Week/30 Days Discharge Instructions: Apply to wound as directed. Do not cut. Compression Wrap: Medichoice 4 layer Compression System, 35-40 mmHG (Generic) 3 x Per Week/30 Days Discharge Instructions: Apply multi-layer wrap as directed. Electronic Signature(s) Signed: 05/18/2021 3:14:27 PM By: Carlene Coria RN Signed: 05/18/2021 4:08:28 PM By: Linton Ham MD Entered By: Carlene Coria on 05/18/2021 15:14:26 MOUSSA, KOWALCHUK ([id number]) -------------------------------------------------------------------------------- Problem List Details Patient Name: [name] Date of Service: 05/18/2021 2:00 PM Medical Record Number: [id number] Patient Account Number: [id number] Date of Birth/Sex: Oct 26, 1980 (40 y.o. M) Treating RN: Carlene Coria Primary Care Provider: [name] Other Clinician: Referring Provider: [name] Treating Provider/Extender: Tito Dine in Treatment: 232 Active Problems ICD-10 Encounter Code Description Active Date MDM Diagnosis I87.2 Venous insufficiency (chronic) (peripheral) 12/04/2016 No Yes L97.222 Non-pressure chronic ulcer of left calf with fat layer exposed 12/04/2016 No Yes E11.622 Type 2 diabetes mellitus with other skin ulcer 04/09/2021 No Yes L88 Pyoderma gangrenosum 03/26/2017 No Yes F17.208 Nicotine dependence, unspecified, with other nicotine-induced disorders 04/24/2020 No Yes Inactive Problems ICD-10 Code Description Active Date Inactive Date L97.213 Non-pressure chronic ulcer of right calf with necrosis of muscle 04/02/2017 04/02/2017 Resolved Problems ICD-10 Code Description Active Date Resolved Date L97.321 Non-pressure chronic ulcer of left ankle limited to breakdown of skin 10/08/2019 10/08/2019 L03.116 Cellulitis of left lower limb 05/24/2019 05/24/2019 Electronic Signature(s) Signed: 05/18/2021 4:08:28 PM By: Linton Ham MD Entered By: Linton Ham on 05/18/2021 14:27:06 [name] ([id number]) -------------------------------------------------------------------------------- Progress Note Details Patient Name: [name] Date of Service: 05/18/2021  2:00 PM Medical Record Number: [id number] Patient Account Number: [id number] Date of Birth/Sex: [birth year] (40 y.o. M) Treating RN: Carlene Coria Primary Care Provider: [name] Other Clinician: Referring Provider: [name] Treating Provider/Extender: Tito Dine in Treatment: 232 Subjective History of Present Illness (HPI) 12/04/16; 40 year old man who comes into the clinic today for review of a wound on the posterior left calf. He tells me that is been there for about a year. He is not a diabetic he does smoke half a pack per day. He was seen in the ER on 11/20/16 felt to have cellulitis around the wound and was given clindamycin. An x-ray did not show  osteomyelitis. The patient initially tells me that he has a milk allergy that sets off a pruritic itching rash on his lower legs which she scratches incessantly and he thinks that's what may have set up the wound. He has been using various topical antibiotics and ointments without any effect. He works in a [employer]ing [employer] and is on his feet all day. He does not have a prior history of wounds however he does have the rash on both lower legs the right arm and the ventral aspect of his left arm. These are excoriations and clearly have had scratching however there are of macular looking areas on both legs including a substantial larger area on the right leg. This does not have an underlying open area. There is no blistering. The patient tells me that 2 years ago in [place] in response to the rash on his legs he saw a dermatologist who told him he had a condition which may be pyoderma gangrenosum although I may be putting words into his mouth. He seemed to recognize this. On further questioning he admits to a 5 year history of quiesced. ulcerative colitis. He is not in any treatment for this. He's had no recent travel 12/11/16; the patient arrives today with his wound and roughly the same condition we've been using silver alginate this is a deep punched out wound with some surrounding erythema but no tenderness. Biopsy I did did not show confirmed pyoderma gangrenosum suggested nonspecific inflammation and vasculitis but does not provide an actual description of what was seen by the pathologist. I'm really not able to understand this We have also received information from the patient's dermatologist in [place] notes from April 2016. This was a doctor Agarwal-antal. 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 He was prescribed topical steroid high potency under occlusion which helped but at this point the patient did not have a deep punched out wound. 12/18/16; the patient's wound is larger in terms of  surface area however this surface looks better and there is less depth. The surrounding erythema also is better. The patient states that the wrap we put on came off 2 days ago when he has been using his compression stockings. He we are in the process of getting a dermatology consult. 12/26/16 on evaluation today patient's left lower extremity wound shows evidence of infection with surrounding erythema noted. He has been tolerating the dressing changes but states that he has noted more discomfort. There is a larger area of erythema surrounding the wound. No fevers, chills, nausea, or vomiting noted at this time. With that being said the wound still does have slough covering the surface. He is not allergic to any medication that he is aware of at this point. In regard to his right lower extremity he had several regions that are erythematous and pruritic  he wonders if there's anything we can do to help that. 01/02/17 I reviewed patient's wound culture which was obtained his visit last week. He was placed on doxycycline at that point. Unfortunately that does not appear to be an antibiotic that would likely help with the situation however the pseudomonas noted on culture is sensitive to Cipro. Also unfortunately patient's wound seems to have a large compared to last week's evaluation. Not severely so but there are definitely increased measurements in general. He is continuing to have discomfort as well he writes this to be a seven out of 10. In fact he would prefer me not to perform any debridement today due to the fact that he is having discomfort and considering he has an active infection on the little reluctant to do so anyway. No fevers, chills, nausea, or vomiting noted at this time. 01/08/17; patient seems dermatology on September 5. I suspect dermatology will want the slides from the biopsy I did sent to their pathologist. I'm not sure if there is a way we can expedite that. In any case the culture I  did before I left on vacation 3 weeks ago showed Pseudomonas he was given 10 days of Cipro and per her description of her intake nurses is actually somewhat better this week although the wound is quite a bit bigger than I remember the last time I saw this. He still has 3 more days of Cipro 01/21/17; dermatology appointment tomorrow. He has completed the ciprofloxacin for Pseudomonas. Surface of the wound looks better however he is had some deterioration in the lesions on his right leg. Meantime the left lateral leg wound we will continue with sample 01/29/17; patient had his dermatology appointment but I can't yet see that note. He is completed his antibiotics. The wound is more superficial but considerably larger in circumferential area than when he came in. This is in his left lateral calf. He also has swollen erythematous areas with superficial wounds on the right leg and small papular areas on both arms. There apparently areas in her his upper thighs and buttocks I did not look at those. Dermatology biopsied the right leg. Hopefully will have their input next week. 02/05/17; patient went back to see his dermatologist who told him that he had a "scratching problem" as well as staph. He is now on a 30 day course of doxycycline and I believe she gave him triamcinolone cream to the right leg areas to help with the itching [not exactly sure but probably triamcinolone]. She apparently looked at the left lateral leg wound although this was not rebiopsied and I think felt to be ultimately part of the same pathogenesis. He is using sample border foam and changing nevus himself. He now has a new open area on the right posterior leg which was his biopsy site I don't have any of the dermatology notes 02/12/17; we put the patient in compression last week with SANTYL to the wound on the left leg and the biopsy. Edema is much better and the depth of the wound is now at level of skin. Area is still the same Biopsy  site on the right lateral leg we've also been using santyl with a border foam dressing and he is changing this himself. 02/19/17; Using silver alginate started last week to both the substantial left leg wound and the biopsy site on the right wound. He is tolerating compression well. Has a an appointment with his primary M.D. tomorrow wondering about diuretics although I'm wondering  if the edema problem is actually lymphedema 02/26/17; the patient has been to see his primary doctor Dr. Jerrel Ivory at [place] our primary care. She started him on Lasix 20 mg and this seems to have helped with the edema. However we are not making substantial change with the left lateral calf wound and inflammation. The biopsy site on the right leg also looks stable but not really all that different. 03/12/17; the patient has been to see vein and vascular Dr. Lucky Cowboy. He has had venous reflux studies I have not reviewed these. I did get a call from his dermatology office. They felt that he might have pathergy based on their biopsy on his right leg which led them to look at the slides of HARSHDEEP, SUMAN ([id number]) the biopsy I did on the left leg and they wonder whether this represents pyoderma gangrenosum which was the original supposition in a man with ulcerative colitis albeit inactive for many years. They therefore recommended clobetasol and tetracycline i.e. aggressive treatment for possible pyoderma gangrenosum. 03/26/17; apparently the patient just had reflux studies not an appointment with Dr. dew. She arrives in clinic today having applied clobetasol for 2-3 weeks. He notes over the last 2-3 days excessive drainage having to change the dressing 3-4 times a day and also expanding erythema. He states the expanding erythema seems to come and go and was last this red was earlier in the month.he is on doxycycline 150 mg twice a day as an anti-inflammatory systemic therapy for possible pyoderma gangrenosum along with the  topical clobetasol 04/02/17; the patient was seen last week by Dr. Lillia Carmel at [facility] dermatology locally who kindly saw him at my request. 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 My concern was the degree of erythema medially extending from his left leg wound which was either inflammation from pyoderma or cellulitis. I put him on Augmentin however culture of the wound showed Pseudomonas which is quinolone sensitive. I really don't believe he has cellulitis however in view of everything I will continue and give him a course of Cipro. He is also on doxycycline as an immune modulator for the pyoderma. In addition to his original wound on the left lateral leg with surrounding erythema he has a wound on the right posterior calf which was an original biopsy site done by dermatology. 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 Saw Dr. Lillia Carmel yesterday. He has been using topical antibiotics to both wound areas his original wound on the left and the biopsies/pathergy area on the right. There is definitely some improvement in the inflammation around the wound on the right although the patient states he has increasing sensitivity of the wounds. He is on prednisone 60 and doxycycline 1 as prescribed by Dr. Lillia Carmel. He is covering the topical antibiotic with gauze and putting this in his own compression stocks and changing this daily. He states that Dr. Lottie Rater did a culture of the left leg wound yesterday 05/07/17; pyoderma gangrenosum. The patient saw Dr. Lillia Carmel yesterday and has a follow-up with her in one month. He is still using topical antibiotics to both wounds although he can't recall exactly what type. He is still on prednisone 60 mg. Dr. Lillia Carmel stated that the doxycycline could stop if we were in agreement. 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 The right medial  leg was induced by biopsy/pathergy. The area on the right is essentially healed. 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 The right medial leg has closed and remains closed. He is still on prednisone 60. He tells me he missed his last dermatology appointment with Dr. Lillia Carmel but will make another appointment. He reports that her blood sugar at a recent screen in [place] was high 200's. He was 180 today. 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 I think he is going to require still much longer prednisone perhaps another 3 months before attempting to taper. In the meantime his wound is a lot better. Smaller. He is cleaning this off daily and applying topical antibiotics. When he was last in the clinic I thought about changing to [facility] and actually put in a couple of calls to dermatology although probably not during their business hours. 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 He continues on oral prednisone. He has been using triple antibiotic ointment. There is surface debris and we will transition to [facility] and have him return in 2 weeks. He has lost 30 pounds since his last appointment with lifestyle modification. He may benefit from topical steroid cream for treatment this can be considered at a later date. 08/22/17 on evaluation today patient appears to actually be doing rather well in regard to his left lateral lower extremity ulcer. He has actually been managed by Dr. Dellia Nims most recently. Patient is currently on oral steroids at this time. This seems to have been of benefit for him. Nonetheless his last visit was actually with Leah on 08/06/17.  Currently he is not utilizing any topical steroid creams although this could be of benefit as well. No fevers, chills, nausea, or vomiting noted at this time. 09/05/17 on evaluation today patient appears to be doing better in regard to his left lateral lower extremity ulcer. He has been tolerating the dressing changes without complication. He is using Santyl with good effect. Overall I'm very pleased with how things are standing at this point. Patient likewise is happy that this is doing better. 09/19/17 on evaluation today patient actually appears to be doing rather well in regard to his left lateral lower extremity ulcer. 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 He's not having any significant pain. 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 He has been tolerating the Santyl without complication and in general I feel like he's making good progress. 10/17/17 on evaluation today patient appears to be doing very well in regard to his left lateral lower surety ulcer. He has been tolerating the dressing changes without complication. There does not appear to be any evidence of infection he's alternating the Santyl and the triple antibiotic ointment every other day this seems to be doing well for him. 11/03/17 on evaluation today patient appears to be doing very well in regard to his left lateral lower extremity ulcer. He is been tolerating the dressing changes without complication which is good news. Fortunately there does not appear to be any evidence of infection which is also great news. Overall is doing excellent they  are starting to taper down on the prednisone is down to 40 mg at this point it also started topical clobetasol for him. 11/17/17 on evaluation today patient appears to be doing well in regard to his left  lateral lower surety ulcer. He's been tolerating the dressing changes without complication. He does note that he is having no pain, no excessive drainage or discharge, and overall he feels like things are going about how he would expect and hope they would. Overall he seems to have no evidence of infection at this time in my opinion which is good news. 12/04/17-He is seen in follow-up evaluation for right lateral lower extremity ulcer. He has been applying topical steroid cream. Today's measurement show slight increase in size. Over the next 2 weeks we will transition to every other day Santyl and steroid cream. He has been encouraged to monitor for changes and notify clinic with any concerns 12/15/17 on evaluation today patient's left lateral motion the ulcer and fortunately is doing worse again at this point. This just since last week to this week has close to doubled in size according to the patient. I did not seeing last week's I do not have a visual to compare this to in our system was also down so we do not have all the charts and at this point. Nonetheless it does have me somewhat concerned in regard to the fact that again he was worried enough about it he has contact the dermatology that placed them back on the full strength, 50 mg a day of the prednisone that he was taken previous. He continues to alternate using clobetasol along with Santyl at this point. He is obviously somewhat frustrated. 12/22/17 on evaluation today patient appears to be doing a little worse compared to last evaluation. Unfortunately the wound is a little deeper and slightly larger than the last week's evaluation. With that being said he has made some progress in regard to the irritation surrounding at this time unfortunately despite that progress that's been made he still has a significant issue going on here. 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 SHONDELL, BRACCI ([id number]) For that reason I discussed with him today the possibility of perform a wound culture to ensure there's nothing overtly infected. 01/06/18 on evaluation today patient's wound is larger and deeper than previously evaluated. With that being said it did appear that his wound was infected after my last evaluation with him. Subsequently I did end up prescribing a prescription for Bactrim DS which she has been taking and having no complication with. Fortunately there does not appear to be any evidence of infection at this point in time as far as anything spreading, no want to touch, and overall I feel like things are showing signs of improvement. 01/13/18 on evaluation today patient appears to be even a little larger and deeper than last time. There still muscle exposed in the base of the wound. Nonetheless he does appear to be less erythematous I do believe inflammation is calming down also believe the infection looks like it's probably resolved at this time based on what I'm seeing. No fevers, chills, nausea, or vomiting noted at this time. 01/30/18 on evaluation today patient actually appears to visually look better for the most part. Unfortunately those visually this looks better he does seem to potentially have what may be an abscess in  the muscle that has been noted in the central portion of the wound. This is the first time that I have noted what appears to be fluctuance in the central portion of the muscle. With that being said I'm somewhat more concerned about the fact that this might indicate an abscess formation at this location. I do believe that an ultrasound would be appropriate. This is likely something we need to try to do as soon as possible. He has been switch to mupirocin ointment and he is no longer using the steroid ointment as prescribed by dermatology he sees them again next week  he's been decreased from 60 to 40 mg of prednisone. 03/09/18 on evaluation today patient actually appears to be doing a little better compared to last time I saw him. There's not as much erythema surrounding the wound itself. He I did review his most recent infectious disease note which was dated 02/24/18. He saw Dr. Michel Bickers in [place]. 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 Patient is now off of antibiotics and they are continually observing this. There seems to be no change in the past two weeks in my pinion based on what the patient says and what I see today compared to what Dr. Megan Salon likely saw two weeks ago. No fevers, chills, nausea, or vomiting noted at this time. 03/23/18 on evaluation today patient's wound actually appears to be showing signs of improvement which is good news. He is currently still on the Dapsone. He is also working on tapering the prednisone to get off of this and Dr. Lottie Rater is working with him in this regard. Nonetheless overall I feel like the wound is doing well it does appear based on the infectious disease note that I reviewed from Dr. 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 I did also review the results of his ultrasound of left lower extremity which revealed there was a dentist tissue in the base of the wound without an abscess noted. 04/06/18 on evaluation today the patient's left lateral lower extremity ulcer actually appears to be doing fairly well which is excellent news. There does not appear to be any evidence of infection at this time which is also great news. Overall he still does have a significantly large ulceration although little by little he seems to be making progress. He is down to 10 mg a day of the prednisone. 04/20/18 on evaluation  today patient actually appears to be doing excellent at this time in regard to his left lower extremity ulcer. He's making signs of good progress unfortunately this is taking much longer than we would really like to see but nonetheless he is making progress. Fortunately there does not appear to be any evidence of infection at this time. No fevers, chills, nausea, or vomiting noted at this time. The patient has not been using the Santyl due to the cost he hadn't got in this field yet. He's mainly been using the antibiotic ointment topically. Subsequently he also tells me that he really has not been scrubbing in the shower I think this would be helpful again as I told him it doesn't have to be anything too aggressive to even make it believe just enough to keep it free of some of the loose slough and biofilm on  the wound surface. 05/11/18 on evaluation today patient's wound appears to be making slow but sure progress in regard to the left lateral lower extremity ulcer. He is been tolerating the dressing changes without complication. Fortunately there does not appear to be any evidence of infection at this time. He is still just using triple antibiotic ointment along with clobetasol occasionally over the area. He never got the Santyl and really does not seem to intend to in my pinion. 06/01/18 on evaluation today patient appears to be doing a little better in regard to his left lateral lower extremity ulcer. He states that overall he does not feel like he is doing as well with the Dapsone as he did with the prednisone. Nonetheless he sees his dermatologist later today and is gonna talk to them about the possibility of going back on the prednisone. Overall again I believe that the wound would be better if you would utilize Santyl but he really does not seem to be interested in going back to the [place] at this point. He has been using triple antibiotic ointment. 06/15/18 on evaluation today patient's wound  actually appears to be doing about the same at this point. Fortunately there is no signs of infection at this time. He has made slight improvements although he continues to not really want to clean the wound bed at this point. He states that he just doesn't mess with it he doesn't want to cause any problems with everything else he has going on. He has been on medication, antibiotics as prescribed by his dermatologist, for a staff infection of his lower extremities which is really drying out now and looking much better he tells me. Fortunately there is no sign of overall infection. 06/29/18 on evaluation today patient appears to be doing well in regard to his left lateral lower surety ulcer all things considering. Fortunately his staff infection seems to be greatly improved compared to previous. He has no signs of infection and this is drying up quite nicely. He is still the doxycycline for this is no longer on cental, Dapsone, or any of the other medications. His dermatologist has recommended possibility of an infusion but right now he does not want to proceed with that. 07/13/18 on evaluation today patient appears to be doing about the same in regard to his left lateral lower surety ulcer. Fortunately there's no signs of infection at this time which is great news. Unfortunately he still builds up a significant amount of Slough/biofilm of the surface of the wound he still is not really cleaning this as he should be appropriately. Again I'm able to easily with saline and gauze remove the majority of this on the surface which if you would do this at home would likely be a dramatic improvement for him as far as getting the area to improve. Nonetheless overall I still feel like he is making progress is just very slow. I think Santyl will be of benefit for him as well. Still he has not gotten this as of this point. 07/27/18 on evaluation today patient actually appears to be doing little worse in regards of the  erythema around the periwound region of the wound he also tells me that he's been having more drainage currently compared to what he was experiencing last time I saw him. He states not quite as bad as what he had because this was infected previously but nonetheless is still appears to be doing poorly. Fortunately there is no evidence of systemic infection at  this point. The patient tells me that he is not going to be able to afford the Santyl. He is still waiting to hear about the infusion therapy with his dermatologist. Apparently she wants an updated colonoscopy first. 08/10/18 on evaluation today patient appears to be doing better in regard to his left lateral lower extremity ulcer. Fortunately he is showing signs of improvement in this regard he's actually been approved for Remicade infusion's as well although this has not been scheduled as of yet. Fortunately there's no signs of active infection at this time in regard to the wound although he is having some issues with infection of the right lower extremity is been seen as dermatologist for this. Fortunately they are definitely still working with him trying to keep things under control. AHSAN, SALOM ([id number]) 09/07/18 on evaluation today patient is actually doing rather well in regard to his left lateral lower extremity ulcer. He notes these actually having some hair grow back on his extremity which is something he has not seen in years. He also tells me that the pain is really not giving them any trouble at this time which is also good news overall she is very pleased with the progress he's using a combination of the mupirocin along with the probate is all mixed. 09/21/18 on evaluation today patient actually appears to be doing fairly well all things considered in regard to his looks from the ulcer. He's been tolerating the dressing changes without complication. Fortunately there's no signs of active infection at this time which is good news he  is still on all antibiotics or prevention of the staff infection. He has been on prednisone for time although he states it is gonna contact his dermatologist and see if she put them on a short course due to some irritation that he has going on currently. Fortunately there's no evidence of any overall worsening this is going very slow I think cental would be something that would be helpful for him although he states that $50 for tube is quite expensive. He therefore is not willing to get that at this point. 10/06/18 on evaluation today patient actually appears to be doing decently well in regard to his left lateral leg ulcer. He's been tolerating the dressing changes without complication. Fortunately there's no signs of active infection at this time. Overall I'm actually rather pleased with the progress he's making although it's slow he doesn't show any signs of infection and he does seem to be making some improvement. I do believe that he may need a switch up and dressings to try to help this to heal more appropriately and quickly. 10/19/18 on evaluation today patient actually appears to be doing better in regard to his left lateral lower extremity ulcer. This is shown signs of having much less Slough buildup at this point due to the fact he has been using the [employer]. Obviously this is very good news. The overall size of the wound is not dramatically smaller but again the appearance is. 11/02/18 on evaluation today patient actually appears to be doing quite well in regard to his lower [place] ulcer. A lot of the skin around the ulcer is actually somewhat irritating at this point this seems to be more due to the dressing causing irritation from the adhesive that anything else. Fortunately there is no signs of active infection at this time. 11/24/18 on evaluation today patient appears to be doing a little worse in regard to his overall appearance of his lower extremity ulcer.  There's more erythema and warmth  around the wound unfortunately. He is currently on doxycycline which he has been on for some time. With that being said I'm not sure that seems to be helping with what appears to possibly be an acute cellulitis with regard to his left lower extremity ulcer. No fevers, chills, nausea, or vomiting noted at this time. 12/08/18 on evaluation today patient's wounds actually appears to be doing significantly better compared to his last evaluation. He has been using Santyl along with alternating tripling about appointment as well as the steroid cream seems to be doing quite well and the wound is showing signs of improvement which is excellent news. Fortunately there's no evidence of infection and in fact his culture came back negative with only normal skin flora noted. 12/21/2018 upon evaluation today patient actually appears to be doing excellent with regard to his ulcer. This is actually the best that I have seen it since have been helping to take care of him. It is both smaller as well as less slough noted on the surface of the wound and seems to be showing signs of good improvement with new skin growing from the edges. He has been using just the triamcinolone he does wonder if he can get a refill of that ointment today. 01/04/2019 upon evaluation today patient actually appears to be doing well with regard to his left lateral lower extremity ulcer. With that being said it does not appear to be that he is doing quite as well as last time as far as progression is concerned. There does not appear to be any signs of infection or significant irritation which is good news. With that being said I do believe that he may benefit from switching to a collagen based dressing based on how clean The wound appears. 01/18/2019 on evaluation today patient actually appears to be doing well with regard to his wound on the left lower extremity. He is not made a lot of progress compared to where we were previous but nonetheless  does seem to be doing okay at this time which is good news. There is no signs of active infection which is also good news. My only concern currently is I do wish we can get him into utilizing the collagen dressing his insurance would not pay for the supplies that we ordered although it appears that he may be able to order this through his supply company that he typically utilizes. This is [employer]. Nonetheless he did try to order it during the office visit today and it appears this did go through. We will see if he can get that it is a different brand but nonetheless he has collagen and I do think will be beneficial. 02/01/2019 on evaluation today patient actually appears to be doing a little worse today in regard to the overall size of his wounds. Fortunately there is no signs of active infection at this time. That is visually. Nonetheless when this is happened before it was due to infection. For that reason were somewhat concerned about that this time as well. 02/08/2019 on evaluation today patient unfortunately appears to be doing slightly worse with regard to his wound upon evaluation today. Is measuring a little deeper and a little larger unfortunately. I am not really sure exactly what is causing this to enlarge he actually did see his dermatologist she is going to see about initiating Humira for him. Subsequently she also did do steroid injections into the wound itself in the periphery. Nonetheless  still nonetheless he seems to be getting a little bit larger he is gone back to just using the steroid cream topically which I think is appropriate. I would say hold off on the collagen for the time being is definitely a good thing to do. Based on the culture results which we finally did get the final result back regarding it shows staph as the bacteria noted again that can be a normal skin bacteria based on the fact however he is having increased drainage and worsening of the wound measurement wise I  would go ahead and place him on an antibiotic today I do believe for this. 02/15/2019 on evaluation today patient actually appears to be doing somewhat better in regard to his ulcer. There is no signs of worsening at this time I did review his culture results which showed evidence of Staphylococcus aureus but not MRSA. Again this could just be more related to the normal skin bacteria although he states the drainage has slowed down quite a bit he may have had a mild infection not just colonization. And was much smaller and then since around10/04/2019 on evaluation today patient appears to be doing unfortunately worse as far as the size of the wound. I really feel like that this is steadily getting larger again it had been doing excellent right at the beginning of September we have seen a steady increase in the area of the wound it is almost 2-1/2 times the size it was on September 1. Obviously this is a bad trend this is not wanting to see. For that reason we went back to using just the topical triamcinolone cream which does seem to help with inflammation. I checked him for bacteria by way of culture and nothing showed positive there. I am considering giving him a short course of a tapering steroid Dosepak today to see if that is can be beneficial for him. The patient is in agreement with giving that a try. 03/08/2019 on evaluation today patient appears to be doing very well in comparison to last evaluation with regard to his lower extremity ulcer. This is showing signs of less inflammation and actually measuring slightly smaller compared to last time every other week over the past month and a half he has been measuring larger larger larger. Nonetheless I do believe that the issue has been inflammation the prednisone does seem to [facility] [place] ([id number]) have been beneficial for him which is good news. No fevers, chills, nausea, vomiting, or diarrhea. 03/22/2019 on evaluation today patient appears to  be doing about the same with regard to his leg ulcer. He has been tolerating the dressing changes without complication. With that being said the wound seems to be mostly arrested at its current size but really is not making any progress except for when we prescribed the prednisone. He did show some signs of dropping as far as the overall size of the wound during that interval week. Nonetheless this is something he is not on long-term at this point and unfortunately I think he is getting need either this or else the Humira which his dermatologist has discussed try to get approval for. With that being said he will be seeing his dermatologist on the 11th of this month that is November. 04/19/2019 on evaluation today patient appears to be doing really about the same the wound is measuring slightly larger compared to last time I saw him. He has not been into the office since November 2 due to the fact that he  unfortunately had Covid as that his entire family. He tells me that it was rough but they did pull-through and he seems to be doing much better. Fortunately there is no signs of active infection at this time. No fevers, chills, nausea, vomiting, or diarrhea. 05/10/2019 on evaluation today patient unfortunately appears to be doing significantly worse as compared to last time I saw him. He does tell me that he has had his first dose of Humira and actually is scheduled to get the next one in the upcoming week. With that being said he tells me also that in the past several days he has been having a lot of issues with green drainage she showed me a picture this is more blue-green in color. He is also been having issues with increased sloughy buildup and the wound does appear to be larger today. Obviously this is not the direction that we want everything to take based on the starting of his Humira. Nonetheless I think this is definitely a result of likely infection and to be honest I think this is probably  Pseudomonas causing the infection based on what I am seeing. 05/24/2019 on evaluation today patient unfortunately appears to be doing significantly worse compared to his prior evaluation with me 2 weeks ago. I did review his culture results which showed that he does have Staph aureus as well as Pseudomonas noted on the culture. Nonetheless the Levaquin that I prescribed for him does not appear to have been appropriate and in fact he tells me he is no longer experiencing the green drainage and discharge that he had at the last visit. Fortunately there is no signs of active infection at this time which is good news although the wound has significantly worsened it in fact is much deeper than it was previous. We have been utilizing up to this point triamcinolone ointment as the prescription topical of choice but at this time I really feel like that the wound is getting need to be packed in order to appropriately manage this due to the deeper nature of the wound. Therefore something along the lines of an alginate dressing may be more appropriate. 05/31/2019 upon inspection today patient's wound actually showed signs of doing poorly at this point. Unfortunately he just does not seem to be making any good progress despite what we have tried. He actually did go ahead and pick up the Cipro and start taking that as he was noticing more green drainage he had previously completed the Levaquin that I prescribed for him as well. Nonetheless he missed his appointment for the seventh last week on Wednesday with the wound care center and [facility] where his dermatologist referred him. Obviously I do think a second opinion would be helpful at this point especially in light of the fact that the patient seems to be doing so poorly despite the fact that we have tried everything that I really know how at this point. The only thing that ever seems to have helped him in the past is when he was on high doses of continual  steroids that did seem to make a difference for him. 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 06/07/2019 upon evaluation today patient unfortunately appears to be doing worse yet again with regard to his wound. In fact I am  starting to question whether or not he may have a fluid pocket in the muscle at this point based on the bulging and the soft appearance to the central portion of the muscle area. There is not anything draining from the muscle itself at this time which is good news but nonetheless the wound is expanding. I am not really seeing any results of the Humira as far as overall wound progression based on what I am seeing at this point. The patient has been referred for second opinion with regard to his wound to the [facility] wound care center by his dermatologist which I definitely am not in opposition to. Unfortunately we tried multiple dressings in the past including collagen, alginate, and at one point even Hydrofera Blue. With that being said he is never really used it for any significant amount of time due to the fact that he often complains of pain associated with these dressings and then will go back to either using the Santyl which she has done intermittently or more frequently the triamcinolone. He is also using his own compression stockings. We have wrapped him in the past but again that was something else that he really was not a big fan of. 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 He seemed to do much better  when he was on the steroids although honestly I understand that the steroids are not necessarily the best medication to be on long-term obviously 06/14/2019 on evaluation today patient appears to be doing actually a little bit better with regard to the overall appearance with his leg. Unfortunately he does continue to have issues with what appears to be some fluid underneath the muscle although he did see the wound specialty center at [facility] last week their main goals were to see about infusion therapy in place of the Humira as they feel like that is not quite strong enough. They also recommended that we continue with the treatment otherwise as we are they felt like that was appropriate and they are okay with him continuing to follow-up here with [place] in that regard. With that being said they are also sending him to the vein specialist there to see about vein stripping and if that would be of benefit for him. Subsequently they also did not really address whether or not an ultrasound of the muscle area to see if there is anything that needs to be addressed here would be appropriate or not. For that reason I discussed this with him last week I think we may proceed down that road at this point. 06/21/2019 upon evaluation today patient's wound actually appears to be doing slightly better compared to previous evaluations. I do believe that he has made a difference with regard to the progression here with the use of oral steroids. Again in the past has been the only thing that is really calm things down. He does tell me that from [facility] is gotten a good news from there that there are no further vein stripping that is necessary at this point. I do not have that available for review today although the patient did relay this to me. He also did  obtain and have the ultrasound of the wound completed which I did sign off on today. It does appear that there is no fluid collection under the muscle this is likely then  just edematous tissue in general. That is also good news. Overall I still believe the inflammation is the main issue here. He did inquire about the possibility of a wound VAC again with the muscle protruding like it is I am not really sure whether the wound VAC is necessarily ideal or not. That is something we will have to consider although I do believe he may need compression wrapping to try to help with edema control which could potentially be of benefit. 06/28/2019 on evaluation today patient appears to be doing slightly better measurement wise although this is not terribly smaller he least seems to be trending towards that direction. With that being said he still seems to have purulent drainage noted in the wound bed at this time. He has been on Levaquin followed by Cipro over the past month. Unfortunately he still seems to have some issues with active infection at this time. I did perform a culture last week in order to evaluate and see if indeed there was still anything going on. Subsequently the culture did come back showing Pseudomonas which is consistent with the drainage has been having which is blue-green in color. He also has had an odor that again was somewhat consistent with Pseudomonas as well. Long story short it appears that the culture showed an intermediate finding with regard to how well the Cipro will work for the Pseudomonas infection. Subsequently being that he does not seem to be clearing up and at best what we are doing is just keeping this at bay I think he may need to see infectious disease to discuss IV antibiotic options. JERMICHAEL, BELMARES ([id number]) 07/05/2019 upon evaluation today patient appears to be doing okay in regard to his leg ulcer. He has been tolerating the dressing changes at this point without complication. Fortunately there is no signs of active infection at this time which is good news. No fevers, chills, nausea, vomiting, or diarrhea. With that being said he  does have an appointment with infectious disease tomorrow and his primary care on Wednesday. Again the reason for the infectious disease referral was due to the fact that he did not seem to be fully resolving with the use of oral antibiotics and therefore we were thinking that IV antibiotic therapy may be necessary secondary to the fact that there was an intermediate finding for how effective the Cipro may be. Nonetheless again he has been having a lot of purulent and even green drainage. Fortunately right now that seems to have calmed down over the past week with the reinitiation of the oral antibiotic. Nonetheless we will see what Dr. Orvan Falconer has to say. 07/12/2019 upon evaluation today patient appears to be doing about the same at this point in regard to his left lower extremity ulcer. Fortunately there is no signs of active infection at this time which is good news I do believe the Levaquin has been beneficial I did review Dr. Blair Dolphin note and to be honest I agree that the patient's leg does appear to be doing better currently. What we found in the past as he does not seem to really completely resolve he will stop the antibiotic and then subsequently things will revert back to having issues with blue-green drainage, increased pain, and overall worsening in general. Obviously that is the reason I sent  him back to infectious disease. 07/19/2019 upon evaluation today patient appears to be doing roughly the same in size there is really no dramatic improvement. He has started back on the Levaquin at this point and though he seems to be doing okay he did still have a lot of blue/green drainage noted on evaluation today unfortunately. I think that this is still indicative more likely of a Pseudomonas infection as previously noted and again he does see Dr. Megan Salon in just a couple of days. I do not know that were really able to effectively clear this with just oral antibiotics alone based on what I am  seeing currently. Nonetheless we are still continue to try to manage as best we can with regard to the patient and his wound. I do think the wrap was helpful in decreasing the edema which is excellent news. No fevers, chills, nausea, vomiting, or diarrhea. 07/26/2019 upon evaluation today patient appears to be doing slightly better with regard to the overall appearance of the muscle there is no dark discoloration centrally. Fortunately there is no signs of active infection at this time. No fevers, chills, nausea, vomiting, or diarrhea. Patient's wound bed currently the patient did have an appointment with Dr. Megan Salon at infectious disease last week. With that being said Dr. Megan Salon the patient states was still somewhat hesitant about put him on any IV antibiotics he wanted [place] to repeat cultures today and then see where things go going forward. He does look like Dr. Megan Salon because of some improvement the patient did have with the Levaquin wanted [place] to see about repeating cultures. If it indeed grows the Pseudomonas again then he recommended a possibility of considering a PICC line placement and IV antibiotic therapy. He plans to see the patient back in 1 to 2 weeks. 08/02/2019 upon evaluation today patient appears to be doing poorly with regard to his left lower extremity. We did get the results of his culture back it shows that he is still showing evidence of Pseudomonas which is consistent with the purulent/blue-green drainage that he has currently. Subsequently the culture also shows that he now is showing resistance to the oral fluoroquinolones which is unfortunate as that was really the only thing to treat the infection prior. I do believe that he is looking like this is going require IV antibiotic therapy to get this under control. Fortunately there is no signs of systemic infection at this time which is good news. The patient does see Dr. Megan Salon tomorrow. 08/09/2019 upon evaluation today  patient appears to be doing better with regard to his left lower extremity ulcer in regard to the overall appearance. He is currently on IV antibiotic therapy. As ordered by Dr. Megan Salon. Currently the patient is on ceftazidime which she is going to take for the next 2 weeks and then follow-up for 4 to 5-week appointment with Dr. Megan Salon. The patient started this this past Friday symptoms have not for a total of 3 days currently in full. 08/16/2019 upon evaluation today patient's wound actually does show muscle in the base of the wound but in general does appear to be much better as far as the overall evidence of infection is concerned. In fact I feel like this is for the most part cleared up he still on the IV antibiotics he has not completed the full course yet but I think he is doing much better which is excellent news. 08/23/2019 upon evaluation today patient appears to be doing about the same with regard  to his wound at this point. He tells me that he still has pain unfortunately. Fortunately there is no evidence of systemic infection at this time which is great news. There is significant muscle protrusion. 09/13/19 upon evaluation today patient appears to be doing about the same in regard to his leg unfortunately. He still has a lot of drainage coming from the ulceration there is still muscle exposed. With that being said the patient's last wound culture still showed an intermediate finding with regard to the Pseudomonas he still having the bluish/green drainage as well. Overall I do not know that the wound has completely cleared of infection at this point. Fortunately there is no signs of active infection systemically at this point which is good news. 09/20/2019 upon evaluation today patient's wound actually appears to be doing about the same based on what I am seeing currently. I do not see any signs of systemic infection he still does have evidence of some local infection and drainage. He did see  Dr. Megan Salon last week and Dr. Megan Salon states that he probably does need a different IV antibiotic although he does not want to put him on this until the patient begins the Remicade infusion which is actually scheduled for about 10 days out from today on 13 May. Following that time Dr. Megan Salon is good to see him back and then will evaluate the feasibility of starting him on the IV antibiotic therapy once again at that point. I do not disagree with this plan I do believe as Dr. Megan Salon stated in his note that I reviewed today that the patient's issue is multifactorial with the pyoderma being 1 aspect of this that were hoping the Remicade will be helpful for her. In the meantime I think the gentamicin is, helping to keep things under decent okay control in regard to the ulcer. 09/27/2019 upon evaluation today patient appears to be doing about the same with regard to his wound still there is a lot of muscle exposure though he does have some hyper granulation tissue noted around the edge and actually some granulation tissue starting to form over the muscle which is actually good news. Fortunately there is no evidence of active infection which is also good news. His pain is less at this point. 5/21; this is a patient I have not seen in a long time. 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 He has a large wound on the left lateral leg with protruding muscle. He comes in the clinic today showing the same area on his left medial ankle. He says there is been a spot there for some time although we have not previously defined this. Today he has a clearly defined area with slight amount of skin breakdown surrounded by raised areas with a purplish hue in color. This is not painful he says it is irritated. 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 He still has muscle  protruding from the lateral portion of his left leg but fortunately the new area that they were concerned about at his last visit does not appear to have opened at this point. He is currently on Remicade infusions and seems to be doing better in my opinion in fact the wound itself seems to be overall much better. The purplish discoloration that he did have seems to have resolved and I think that is a good sign that hopefully the Remicade  is doing its job. He does have some biofilm noted over the surface of the wound. 11/01/2019 on evaluation today patient's wound actually appears to be doing excellent at this time. Fortunately there is no evidence of active infection and overall I feel like he is making great progress. The Remicade seems to be due excellent job in my opinion. JONAHTAN, HIRA ([id number]) 11/08/19 evaluation today vision actually appears to be doing quite well with regard to his weight ulcer. He's been tolerating dressing changes without complication. Fortunately there is no evidence of infection. No fevers, chills, nausea, or vomiting noted at this time. Overall states that is having more itching than pain which is actually a good sign in my opinion. 12/13/2019 upon evaluation today patient appears to be doing well today with regard to his wound. He has been tolerating the dressing changes without complication. Fortunately there is no sign of active infection at this time. No fevers, chills, nausea, vomiting, or diarrhea. Overall I feel like the infusion therapy has been very beneficial for him. 01/06/2020 on evaluation today patient appears to be doing well with regard to his wound. This is measuring smaller and actually looks to be doing better. Fortunately there is no signs of active infection at this point. No fevers, chills, nausea, vomiting, or diarrhea. With that being said he does still have the blue-green drainage but this does not seem to be causing any significant issues  currently. He has been using the gentamicin that does seem to be keeping things under decent control at this point. He goes later this morning for his next infusion therapy for the pyoderma which seems to also be very beneficial. 02/07/2020 on evaluation today patient appears to be doing about the same in regard to his wounds currently. Fortunately there is no signs of active infection systemically he does still have evidence of local infection still using gentamicin. He also is showing some signs of improvement albeit slowly I do feel like we are making some progress here. 02/21/2020 upon evaluation today patient appears to be making some signs of improvement the wound is measuring a little bit smaller which is great news and overall I am very pleased with where he stands currently. He is going to be having infusion therapy treatment on the 15th of this month. Fortunately there is no signs of active infection at this time. 03/13/2020 I do believe patient's wound is actually showing some signs of improvement here which is great news. He has continue with the infusion therapy through rheumatology/dermatology at [facility]. That does seem to be beneficial. I still think he gets as much benefit from this as he did from the prednisone initially but nonetheless obviously this is less harsh on his body that the prednisone as far as they are concerned. 03/31/2020 on evaluation today patient's wound actually showing signs of some pretty good improvement in regard to the overall appearance of the wound bed. There is still muscle exposed though he does have some epithelial growth around the edges of the wound. Fortunately there is no signs of active infection at this time. No fevers, chills, nausea, vomiting, or diarrhea. 04/24/2020 upon evaluation today patient appears to be doing about the same in regard to his leg ulcer. He has been tolerating the dressing changes without complication. Fortunately there is no signs  of active infection at this time. No fevers, chills, nausea, vomiting, or diarrhea. With that being said he still has a lot of irritation from the bandaging around the edges of  the wound. We did discuss today the possibility of a referral to plastic surgery. 05/22/2020 on evaluation today patient appears to be doing well with regard to his wounds all things considered. He has not been able to get the Chantix apparently there is a recall nurse that I was unaware of put out by [employer] involuntarily. Nonetheless for now I am and I have to do some research into what may be the best option for him to help with quitting in regard to smoking and we discussed that today. 06/26/2020 upon evaluation today patient appears to be doing well with regard to his wound from the standpoint of infection I do not see any signs of infection at this point. With that being said unfortunately he is still continuing to have issues with muscle exposure and again he is not having a whole lot of new skin growth unfortunately. There does not appear to be any signs of active infection at this time. No fevers, chills, nausea, vomiting, or diarrhea. 07/10/2020 upon evaluation today patient appears to be doing a little bit more poorly currently compared to where he was previous. I am concerned currently about an active infection that may be getting worse especially in light of the increased size and tenderness of the wound bed. No fevers, chills, nausea, vomiting, or diarrhea. 07/24/2020 upon evaluation today patient appears to be doing poorly in regard to his leg ulcer. He has been tolerating the dressing changes without complication but unfortunately is having a lot of discomfort. 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 Subsequently I think he is going require possibly IV antibiotics to get this under control. I am very concerned about the severity of his infection  and the amount of discomfort he is having. 07/31/2020 upon evaluation today patient appears to be doing about the same in regard to his leg wound. He did see Dr. Megan Salon and Dr. Megan Salon is actually going to start him on IV antibiotics. He goes for the PICC line tomorrow. With that being said there do not have that run for 2 weeks and then see how things are doing and depending on how he is progressing they may extend that a little longer. Nonetheless I am glad this is getting ready to be in place and definitely feel it may help the patient. In the meantime is been using mainly triamcinolone to the wound bed has an anti-inflammatory. 08/07/2020 on evaluation today patient appears to be doing well with regard to his wound compared even last week. In the interim he has gotten the PICC line placed and overall this seems to be doing excellent. There does not appear to be any evidence of infection which is great news systemically although locally of course has had the infection this appears to be improving with the use of the antibiotics. 08/14/2020 upon evaluation today patient's wound actually showing signs of excellent improvement. Overall the irritation has significantly improved the drainage is back down to more of a normal level and his pain is really pretty much nonexistent compared to what it was. Obviously I think that this is significantly improved secondary to the IV antibiotic therapy which has made all the difference in the world. Again he had a resistant form of Pseudomonas for which oral antibiotics just was not cutting it. Nonetheless I do think that still we need to consider the possibility of a surgical closure for this wound is been open so long and to be honest  with muscle exposed I think this can be very hard to get this to close outside of this although definitely were still working to try to do what we can in that regard. 08/21/2020 upon evaluation today patient appears to be doing very  well with regard to his wounds on the left lateral lower extremity/calf area. Fortunately there does not appear to be signs of active infection which is great news and overall very pleased with where things stand today. He is actually wrapping up his treatment with IV antibiotics tomorrow. After that we will see where things go from there. 08/28/2020 upon evaluation today patient appears to be doing decently well with regard to his leg ulcer. There does not appear to be any signs of active infection which is great news and overall very pleased with where things stand today. No fevers, chills, nausea, vomiting, or diarrhea. 09/18/2020 upon evaluation today patient appears to be doing well with regard to his infection which I feel like is better. Unfortunately he is not doing as well with regard to the overall size of the wound which is not nearly as good at this point. I feel like that he may be having an issue here with the pyoderma being somewhat out of control. I think that he may benefit from potentially going back and talking to the dermatologist about VANNAK, LOJEK ([id number]) what to do from the pyoderma standpoint. I am not certain if the infusions are helping nearly as much is what the prednisone did in the past. 10/02/2020 upon evaluation today patient appears to be doing well with regard to his leg ulcer. He did go to the Psychiatric nurse. Unfortunately they feel like there is a 10% chance that most that he would be able to heal and that the skin graft would take. Obviously this has led him to not be able to go down that path as far as treatment is concerned. Nonetheless he does seem to be doing a little bit better with the prednisone that I gave him last time. I think that he may need to discuss with dermatology the possibility of long-term prednisone as that seems to be what is most helpful for him to be perfectly honest. I am not sure the Remicade is really doing the job. 10/17/2020 upon  evaluation today patient appears to be doing a little better in regard to his wound. In fact the case has been since we did the prednisone on May 2 for him that we have noticed a little bit of improvement each time we have seen a size wise as well as appearance wise as well as pain wise. I think the prednisone has had a greater effect then the infusion therapy has to be perfectly honest. With that being said the patient also feels significantly better compared to what he was previous. All of this is good news but nonetheless I am still concerned about the fact that again we are really not set up to long-term manage him as far as prednisone is concerned. Obviously there are things that you need to be watched I completely understand the risk of prednisone usage as well. 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 With all that being said I do believe that we can give him another round of the prednisone which she is requesting today because of the improvement that he seen since we did that first round. 10/30/2020 upon evaluation today patient's wound actually is showing  signs of doing quite well. There does not appear to be any evidence of infection which is great news and overall very pleased with where things stand today. No fevers, chills, nausea, vomiting, or diarrhea. He tells me that the prednisone still has seem to have helped he wonders if we can extend that for just a little bit longer. He did not have the appointment with a dermatologist although he did have an infusion appointment last Friday. That was at [facility]. With that being said he tells me he could not do both that as well as the appointment with the physician on the same day therefore that is can have to be rescheduled. I really want to see if there is anything they feel like that could be done differently to try to help this out as I am not really certain that the infusions are helping  significantly here. 11/13/2020 upon evaluation today patient unfortunately appears to be doing somewhat poorly in regard to his wound I feel like this is actually worsening from the standpoint of the pyoderma spreading. I still feel like that he may need something different as far as trying to manage this going forward. Again we did the prednisone unfortunately his blood sugars are not doing so well because of this. Nonetheless I believe that the patient likely needs to try topical steroid. We have done triamcinolone for a while I think going with something stronger such as clobetasol could be beneficial again this is not something I do lightly I discussed this with the patient that again this does not normally put underneath an occlusive dressing. Nonetheless I think a thin film as such could help with some of the stronger anti-inflammatory effects. We discussed this today. He would like to try to give this a trial for the next couple weeks. I definitely think that is something that we can do. Evaluate7/03/2021 and today patient's wound bed actually showed signs of doing really about the same. There was a little expansion of the size of the wound and that leading edge that we done looking out although the clobetasol does seem to have slowed this down a bit in my opinion. There is just 1 small area that still seems to be progressing based on what I see. Nonetheless I am concerned about the fact this does not seem to be improving if anything seems to be doing a little bit worse. I do not know that the infusions are really helping him much as next infusion is August 5 his appointment with dermatology is July 25. Either way I really think that we need to have a conversation potentially about this and I am actually going to see if I can talk with Dr. Carles Collet in order to see where things stand as well. 12/11/2020 upon evaluation today patient appears to be doing worse in regard to his leg ulcer.  Unfortunately I just do not think this is making the progress that I would like to see at this point. Honestly he does have an appointment with dermatology and this is in 2 days. I am wondering what they may have to offer to help with this. Right now what I am seeing is that he is continuing to show signs of worsening little by little. Obviously that is not great at all. Is the exact opposite of what we are looking for. 12/18/2020 upon evaluation today patient appears to be doing a little better in regard to his wound. The dermatologist actually did do some steroid injections  into the wound which does seem to have been beneficial in my opinion. That was on the 25th already this looks a little better to me than last time I saw him. With that being said we did do a culture and this did show that he has Staph aureus noted in abundance in the wound. With that being said I do think that getting him on an oral antibiotic would be appropriate as well. Also think we can compression wrap and this will make a difference as well. 12/28/2020 upon evaluation today patient's wound is actually showing signs of doing much better. I do believe the compression wrap is helping he has a lot of drainage but to be honest I think that the compression is helping to some degree in this regard as well as not draining through which is also good news. No fevers, chills, nausea, vomiting, or diarrhea. 01/04/2021 upon evaluation today patient appears to be doing well with regard to his wound. Overall things seem to be doing quite well. He did have a little bit of reaction to the CarboFlex Sorbact he will be using that any longer. With that being said he is controlled as far as the drainage is concerned overall and seems to be doing quite well. I do not see any signs of active infection at this time which is great news. No fevers, chills, nausea, vomiting, or diarrhea. 01/11/2021 upon evaluation today patient appears to be doing well  with regard to his wounds. He has been tolerating the dressing changes without complication. Fortunately there does not appear to be any signs of active infection at this time which is great news. Overall I am extremely pleased with where we stand currently. No fevers, chills, nausea, vomiting, or diarrhea. Where using clobetasol in the wound bed he has a lot of new skin growth which is awesome as well. 01/18/2021 upon evaluation today patient appears to be doing very well in regard to his leg ulcer. He has been tolerating the dressing changes without complication. Fortunately there does not appear to be any signs of active infection which is great news. In general I think that he is making excellent progress 01/25/2021 upon evaluation today patient appears to be doing well with regard to his wound on the leg. I am actually extremely pleased with where things stand today. There does not appear to be any signs of active infection which is great news and overall I think that we are definitely headed in the appropriate direction based on what I am seeing currently. There does not appear to be any signs of active infection also excellent news. 02/06/2021 upon evaluation today patient appears to be doing well with regard to his wound. Overall visually this is showing signs of significant improvement which is great news. I do not see any signs of active infection systemically which is great even locally I do not think that we are seeing any major complications here. We did do fluorescence imaging with the [employer] DX today. The patient does have some odor and drainage noted and again this is something that I think would benefit him to probably come more frequently for nurse visits. 02/19/2021 upon evaluation today patient actually appears to be doing quite well in regard to his wound. He has been tolerating the dressing Nakatani, Clester ([id number]) changes without complication and overall I think that this is  making excellent progress. I do not see any evidence of active infection at this point which is great news as well.  No fevers, chills, nausea, vomiting, or diarrhea. 10/10; wound is made nice progress healthy granulation with a nice rim of epithelialization which seems to be expanding even from last week he has a deeper area in the inferior part of the more distal part of the wound with not quite as healthy as surface. This area will need to be followed. Using clobetasol and Hydrofera Blue 03/05/2021 upon evaluation today patient appears to be doing very well in regard to his leg ulcer. He has been tolerating dressing changes without complication. Fortunately there does not appear to be any signs of active infection which is great news and overall I am extremely pleased with where we stand currently. 03/12/2021 upon evaluation today patient appears to be doing well with regard to his wound in fact this is extremely extremely good based on what we are seeing today there does not appear to be any signs of active infection and overall I think that he is doing awesome from the standpoint of healing in general. I am extremely pleased with how things seem to be progressing with regard to this pyoderma. Clobetasol has done wonders for him. I think the compression wrapping has also been of great benefit. 03/19/2021 upon evaluation today patient appears to be doing well with regard to his wound. He is tolerating the dressing changes without complication. In fact I feel like that he is actually making excellent progress at this point based on what I am seeing. No fevers, chills, nausea, vomiting, or diarrhea. 03/26/2021 upon evaluation today patient appears to be doing well with regard to his wound. This again is measuring smaller and looking better. Again the progress is slow but nonetheless continual with what we have been seeing. I do believe that the current plan is doing awesome for him. 04/09/2021 upon  evaluation today patient appears to be doing well with regard to his leg ulcer. This is showing signs of excellent improvement the muscle is completely closed over and there does not appear to be any evidence of inflammation at this point his drainage is significantly improved. Overall I think that he would be a good candidate for looking into a skin substitute at this point as well. We will get a look into some approvals in that regard. Potentially TheraSkin as well as Apligraf could both be considered just depending on insurance coverage. 04/16/2021 upon evaluation today patient appears to be doing well at this point. He has been approved for the Apligraf which we could definitely order although I would like to try to get the TheraSkin approved if at all possible. I did fax notes into them today and I Ernie HewMinna try to give a call as well. Overall the wound appears to be doing decently well today. 04/20/2021 upon evaluation today patient actually appears to be doing quite well in regard to his wound with that being said we are trying to see what we can do about speeding up the healing process. For that reason we did discuss the possibility of a skin substitute. We got the Apligraf approved. For that reason we will get a go ahead and see what we can do with the Apligraf at this point. I am still trying to get the TheraSkin approved but I have not heard anything from the insurance company yet I have called and talked to them earlier in the week and to be honest this was about half an hour that I spent on the phone they told me I would hear something within 10-15  business days. 04/27/2021 upon evaluation today patient appears to be doing excellent in regard to his wound. I do believe the Apligraf has been beneficial. With that being said he has had a little bit of increased pain has been a little bit concerned. I do want to go ahead and apply his steroid cream today the clobetasol and then put the Apligraf  over I just do not think I want to risk not putting the clobetasol on based on what is been going on here currently. Patient voiced understanding. 05/04/2021 upon evaluation today patient is making excellent improvement. Overall there is definitely decrease in the size of the wound today and I am very pleased in that regard. I do not see any signs of active infection locally nor systemically at this point. No fevers, chills, nausea, vomiting, or diarrhea. 05/11/2021 upon evaluation today patient appears to be doing well with regard to his wound. He has been tolerating Apligraf's without complication and is making excellent progress this is application #4 today. 12/30; patient here for Apligraf application. Appears to be doing well small wound there has been a lot of healing Objective Constitutional Patient is hypertensive.. Pulse regular and within target range for patient.Marland Kitchen Respirations regular, non-labored and within target range.. Temperature is normal and within the target range for the patient.Marland Kitchen appears in no distress. Vitals Time Taken: 2:04 PM, Height: 71 in, Weight: 338 lbs, BMI: 47.1, Temperature: 98.2 F, Pulse: 92 bpm, Respiratory Rate: 18 breaths/min, Blood Pressure: 169/94 mmHg. General Notes: Wound exam; left lateral leg. Healthy looking granulation. Certainly much better than I remember this although I see in August pictures suggesting that this deteriorated possibly because of infection. No debridement was necessary Apligraf applied in the standard fashion Integumentary (Hair, Skin) Wound #1 status is Open. Original cause of wound was Gradually Appeared. The date acquired was: 11/18/2015. The wound has been in treatment 232 weeks. The wound is located on the Left,Lateral Lower Leg. The wound measures 2.5cm length x 2cm width x 0.1cm depth; 3.927cm^2 area and 0.393cm^3 volume. There is Fat Layer (Subcutaneous Tissue) exposed. There is no tunneling or undermining noted. There is a  medium amount of serosanguineous drainage noted. The wound margin is flat and intact. There is large (67-100%) red granulation within the wound bed. Spath, Darian ([id number]) There is a small (1-33%) amount of necrotic tissue within the wound bed including Adherent Slough. 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 A skin graft procedure using a bioengineered skin substitute/cellular or tissue based product was performed by Ricard Dillon, MD with the following instrument(s): Forceps and Scissors. Apligraf was applied and secured with Steri-Strips. 33 sq cm of product was utilized and 11 sq cm was wasted due to wound size. Post Application, adaptic was applied. A Time Out was conducted at 14:18, prior to the start of the procedure. The procedure was tolerated well with a pain level of 0 throughout and a pain level of 0 following the procedure. Post procedure Diagnosis Wound #1: Same as Pre-Procedure . Plan Follow-up Appointments: Return Appointment in 1 week. Nurse Visit as needed - twice a week Bathing/ Shower/ Hygiene: Clean wound with Normal Saline or wound cleanser. Cellular or Tissue Based Products: Cellular or Tissue Based Product Type: -  APLIGRAF #5 Cellular or Tissue Based Product applied to wound bed; including contact layer, fixation with steri-strips, dry gauze and cover dressing. (DO NOT REMOVE). Edema Control - Lymphedema / Segmental Compressive Device / Other: Optional: One layer of unna paste to top of compression wrap (to act as an anchor). Elevate, Exercise Daily and Avoid Standing for Long Periods of Time. Elevate legs to  the level of the heart and pump ankles as often as possible Elevate leg(s) parallel to the floor when sitting. WOUND #1: - Lower Leg Wound Laterality: Left, Lateral Cleanser: Wound Cleanser 3 x Per Week/30 Days Discharge Instructions: Wash your hands with soap and water. Remove old dressing, discard into plastic bag and place into trash. Cleanse the wound with Wound Cleanser prior to applying a clean dressing using gauze sponges, not tissues or cotton balls. Do not scrub or use excessive force. Pat dry using gauze sponges, not tissue or cotton balls. Peri-Wound Care: Moisturizing Lotion 3 x Per Week/30 Days Discharge Instructions: Suggestions: Theraderm, Eucerin, Cetaphil, or patient preference. Secondary Dressing: Xtrasorb Medium 4x5 (in/in) 3 x Per Week/30 Days Discharge Instructions: Apply to wound as directed. Do not cut. Compression Wrap: Medichoice 4 layer Compression System, 35-40 mmHG (Generic) 3 x Per Week/30 Days Discharge Instructions: Apply multi-layer wrap as directed. 1. Apligraf applied. Significant improvement noted in surface area Electronic Signature(s) Signed: 05/23/2021 3:54:14 PM By: [name], [name], RN, [name], [name], [name] Signed: 05/23/2021 4:08:25 PM By: Linton Ham MD Previous Signature: 05/18/2021 4:08:28 PM Version By: Linton Ham MD TOREE, DILLAHUNT ([id number]) Entered By: [name] [name], RN, [name], [name] on 05/23/2021 15:54:14 NATHANUAL, RASK ([id number]) -------------------------------------------------------------------------------- George Details Patient Name: [name] Date of Service: 05/18/2021 Medical Record Number: [id number] Patient Account Number: [id number] Date of Birth/Sex: 1980/12/30 (40 y.o. M) Treating RN: Carlene Coria Primary Care Provider: [name] Other Clinician: Referring Provider: [name] Treating Provider/Extender: Tito Dine in Treatment: 232 Diagnosis Coding ICD-10 Codes Code Description I87.2 Venous  insufficiency (chronic) (peripheral) L97.222 Non-pressure chronic ulcer of left calf with fat layer exposed E11.622 Type 2 diabetes mellitus with other skin ulcer L88 Pyoderma gangrenosum F17.208 Nicotine dependence, unspecified, with other nicotine-induced disorders Facility Procedures CPT4 Code: [id number] Description: [id number] - SKIN SUB GRAFT TRNK/ARM/LEG Modifier: Quantity: 1 CPT4 Code: Description: ICD-10 Diagnosis Description L97.222 Non-pressure chronic ulcer of left calf with fat layer exposed I87.2 Venous insufficiency (chronic) (peripheral) E11.622 Type 2 diabetes mellitus with other skin ulcer Modifier: Quantity: CPT4 Code: [id number] Description: [phone] (Facility Use Only) Apligraf 44 SQ CM Modifier: Quantity: 44 Physician Procedures CPT4 Code: [id number] Description: [id number] - WC PHYS SKIN SUB GRAFT TRNK/ARM/LEG Modifier: Quantity: 1 CPT4 Code: Description: ICD-10 Diagnosis Description L97.222 Non-pressure chronic ulcer of left calf with fat layer exposed I87.2 Venous insufficiency (chronic) (peripheral) E11.622 Type 2 diabetes mellitus with other skin ulcer Modifier: Quantity: Electronic Signature(s) Signed: 05/22/2021 10:10:14 AM By: [name], [name], RN, [name], [name], [name] Signed: 05/22/2021 10:19:28 AM By: Linton Ham MD Previous Signature: 05/18/2021 4:08:28 PM Version By: Linton Ham MD Entered By: [name], [name], RN, [name], [name] on 05/22/2021 10:10:13

## 2021-05-18 NOTE — Progress Notes (Signed)
CHRISTIA, COAXUM ([id number]) Visit Report for 05/18/2021 Arrival Information Details Patient Name: [name], [name] Date of Service: 05/18/2021 2:00 PM Medical Record Number: [id number] Patient Account Number: [id number] Date of Birth/Sex: 01-09-1981 (40 y.o. M) Treating RN: Carlene Coria Primary Care Dmoni Fortson: [name] Other Clinician: Referring Ginny Loomer: [name] Treating Seven Marengo/Extender: Tito Dine in Treatment: 71 Visit Information History Since Last Visit All ordered tests and consults were completed: No Patient Arrived: Ambulatory Added or deleted any medications: No Arrival Time: 14:04 Any new allergies or adverse reactions: No Accompanied By: wife Had a fall or experienced change in No Transfer Assistance: None activities of daily living that may affect Patient Identification Verified: Yes risk of falls: Secondary Verification Process Completed: Yes Signs or symptoms of abuse/neglect since last visito No Patient Requires Transmission-Based No Hospitalized since last visit: No Precautions: Implantable device outside of the clinic excluding No Patient Has Alerts: Yes cellular tissue based products placed in the center Patient Alerts: Patient has reaction since last visit: to Has Dressing in Place as Prescribed: Yes silver dressings. Has Compression in Place as Prescribed: Yes Pain Present Now: No Electronic Signature(s) Signed: 05/18/2021 4:17:41 PM By: Carlene Coria RN Entered By: Carlene Coria on 05/18/2021 14:04:40 [name] ([id number]) -------------------------------------------------------------------------------- Clinic Level of Care Assessment Details Patient Name: [name], [name] Date of Service: 05/18/2021 2:00 PM Medical Record Number: [id number] Patient Account Number: [id number] Date of Birth/Sex: 1981/01/17 (40 y.o. M) Treating RN: Carlene Coria Primary Care Madsen Riddle: [name] Other Clinician: Referring Sender Rueb: [name] Treating Kohen Reither/Extender: Tito Dine in Treatment: 6 Clinic Level of Care Assessment Items TOOL 1 Quantity Score _0  - Use when EandM and Procedure is performed on INITIAL visit 0 ASSESSMENTS - Nursing Assessment / Reassessment _1  - General Physical Exam (combine w/ comprehensive assessment (listed just below) when performed on new 0 pt. evals) _2  - 0 Comprehensive Assessment (HX, ROS, Risk Assessments, Wounds Hx, etc.) ASSESSMENTS - Wound and Skin Assessment / Reassessment _3  - Dermatologic / Skin Assessment (not related to wound area) 0 ASSESSMENTS - Ostomy and/or Continence Assessment and Care _4  - Incontinence Assessment and Management 0 _5  - 0 Ostomy Care Assessment and Management (repouching, etc.) PROCESS - Coordination of Care _6  - Simple Patient / Family Education for ongoing care 0 _7  - 0 Complex (extensive) Patient / Family Education for ongoing care _8  - 0 Staff obtains Programmer, systems, Records, Test Results / Process Orders _9  - 0 Staff telephones HHA, Nursing Homes / Clarify orders / etc _10  - 0 Routine Transfer to another Facility (non-emergent condition) _11  - 0 Routine Hospital Admission (non-emergent condition) _12  - 0 New Admissions / Biomedical engineer / Ordering NPWT, Apligraf, etc. _13  - 0 Emergency Hospital Admission (emergent condition) PROCESS - Special Needs _14  - Pediatric / Minor Patient Management 0 _15  - 0 Isolation Patient Management _16  - 0 Hearing / Language / Visual special needs _17  - 0 Assessment of Community assistance (transportation, D/C planning, etc.) _18  - 0 Additional assistance / Altered mentation _19  - 0 Support Surface(s) Assessment (bed, cushion, seat, etc.) INTERVENTIONS - Miscellaneous _20  - External ear exam 0 _21  - 0 Patient Transfer (multiple staff / Civil Service fast streamer / Similar devices) _22  - 0 Simple Staple / Suture removal (25 or less) _23  - 0 Complex Staple / Suture removal (26 or more) _24  -  0 Hypo/Hyperglycemic Management (do not check if billed separately) _25  - 0 Ankle / Brachial Index (ABI) - do not check if billed separately Has the patient been seen  at the hospital within the last three years: Yes Total Score: 0 Level Of Care: ____ [name] ([id number]) Electronic Signature(s) Signed: 05/18/2021 4:17:41 PM By: Carlene Coria RN Entered By: Carlene Coria on 05/18/2021 15:16:26 [name], [name] ([id number]) -------------------------------------------------------------------------------- Encounter Discharge Information Details Patient Name: [name] Date of Service: 05/18/2021 2:00 PM Medical Record Number: [id number] Patient Account Number: [id number] Date of Birth/Sex: 1980/12/01 (40 y.o. M) Treating RN: Carlene Coria Primary Care Venie Montesinos: [name] Other Clinician: Referring Linn Goetze: [name] Treating Dontrae Morini/Extender: Tito Dine in Treatment: 62 Encounter Discharge Information Items Post Procedure Vitals Discharge Condition: Stable Temperature (F): 98.2 Ambulatory Status: Ambulatory Pulse (bpm): 92 Discharge Destination: Home Respiratory Rate (breaths/min): 18 Transportation: Private Auto Blood Pressure (mmHg): 169/94 Accompanied By: wife Schedule Follow-up Appointment: Yes Clinical Summary of Care: Patient Declined Electronic Signature(s) Signed: 05/18/2021 3:20:14 PM By: Carlene Coria RN Entered By: Carlene Coria on 05/18/2021 15:20:14 [name] ([id number]) -------------------------------------------------------------------------------- Lower Extremity Assessment Details Patient Name: [name] Date of Service: 05/18/2021 2:00 PM Medical Record Number: [id number] Patient Account Number: [id number] Date of Birth/Sex: July 28, 1980 (40 y.o. M) Treating RN: Carlene Coria Primary Care Haya Hemler: [name] Other Clinician: Referring Rushton Early: [name] Treating Truc Winfree/Extender: Tito Dine in  Treatment: 232 Edema Assessment Assessed: [Left: No] [Right: No] Edema: [Left: Ye] [Right: s] Calf Left: Right: Point of Measurement: 35 cm From Medial Instep 48 cm Ankle Left: Right: Point of Measurement: 10 cm From Medial Instep 29 cm Vascular Assessment Pulses: Dorsalis Pedis Palpable: [Left:Yes] Electronic Signature(s) Signed: 05/18/2021 4:17:41 PM By: Carlene Coria RN Entered By: Carlene Coria on 05/18/2021 14:15:51 [name], [name] ([id number]) -------------------------------------------------------------------------------- Multi Wound Chart Details Patient Name: [name] Date of Service: 05/18/2021 2:00 PM Medical Record Number: [id number] Patient Account Number: [id number] Date of Birth/Sex: Aug 28, 1980 (40 y.o. M) Treating RN: Carlene Coria Primary Care Bernedette Auston: [name] Other Clinician: Referring Khalil Szczepanik: [name] Treating Rachard Isidro/Extender: Tito Dine in Treatment: 232 Vital Signs Height(in): 71 Pulse(bpm): 92 Weight(lbs): 338 Blood Pressure(mmHg): 169/94 Body Mass Index(BMI): 47 Temperature(F): 98.2 Respiratory Rate(breaths/min): 18 Photos: [N/A:N/A] Wound Location: Left, Lateral Lower Leg N/A N/A Wounding Event: Gradually Appeared N/A N/A Primary Etiology: Pyoderma N/A N/A Comorbid History: Sleep Apnea, Hypertension, Colitis N/A N/A Date Acquired: 11/18/2015 N/A N/A Weeks of Treatment: 232 N/A N/A Wound Status: Open N/A N/A Measurements L x W x D (cm) 2.5x2x0.1 N/A N/A Area (cm) : 3.927 N/A N/A Volume (cm) : 0.393 N/A N/A % Reduction in Area: 20.00% N/A N/A % Reduction in Volume: 90.00% N/A N/A Classification: Full Thickness With Exposed N/A N/A Support Structures Exudate Amount: Medium N/A N/A Exudate Type: Serosanguineous N/A N/A Exudate Color: red, brown N/A N/A Wound Margin: Flat and Intact N/A N/A Granulation Amount: Large (67-100%) N/A N/A Granulation Quality: Red N/A N/A Necrotic Amount: Small (1-33%) N/A  N/A Exposed Structures: Fat Layer (Subcutaneous Tissue): N/A N/A Yes Fascia: No Tendon: No Muscle: No Joint: No Bone: No Epithelialization: Medium (34-66%) N/A N/A Treatment Notes Electronic Signature(s) Signed: 05/18/2021 4:17:41 PM By: Carlene Coria RN Entered By: Carlene Coria on 05/18/2021 14:17:33 [name] ([id number]) -------------------------------------------------------------------------------- Multi-Disciplinary Care Plan Details Patient Name: [name] Date of Service: 05/18/2021 2:00 PM Medical Record Number: [id number] Patient Account Number: [id number] Date of Birth/Sex: [birth year] (40 y.o. M) Treating RN: Carlene Coria Primary Care Zane Pellecchia: [name] Other Clinician: Referring Denyla [name]: [name] Treating Jaeliana Lococo/Extender: Tito Dine in Treatment: 97 oo Multidisciplinary Care Plan reviewed with physician Active Inactive Necrotic Tissue Nursing Diagnoses: Impaired tissue integrity  related to necrotic/devitalized tissue Knowledge deficit related to management of necrotic/devitalized tissue Goals: Necrotic/devitalized tissue will be minimized in the wound bed Date Initiated: 03/26/2021 Target Resolution Date: 05/26/2021 Goal Status: Active Patient/caregiver will verbalize understanding of reason and process for debridement of necrotic tissue Date Initiated: 03/26/2021 Date Inactivated: 04/27/2021 Target Resolution Date: 03/26/2021 Goal Status: Met Interventions: Assess patient pain level pre-, during and post procedure and prior to discharge Provide education on necrotic tissue and debridement process Treatment Activities: Apply topical anesthetic as ordered : 03/26/2021 Notes: Electronic Signature(s) Signed: 05/18/2021 4:17:41 PM By: Carlene Coria RN Entered By: Carlene Coria on 05/18/2021 14:17:15 [name], [name] ([id number]) -------------------------------------------------------------------------------- Pain Assessment  Details Patient Name: [name] Date of Service: 05/18/2021 2:00 PM Medical Record Number: [id number] Patient Account Number: [id number] Date of Birth/Sex: 12-25-1980 (40 y.o. M) Treating RN: Carlene Coria Primary Care Myrtle Haller: [name] Other Clinician: Referring Luccas Towell: [name] Treating Tekila Caillouet/Extender: Tito Dine in Treatment: 232 Active Problems Location of Pain Severity and Description of Pain Patient Has Paino No Site Locations Pain Management and Medication Current Pain Management: Electronic Signature(s) Signed: 05/18/2021 4:17:41 PM By: Carlene Coria RN Entered By: Carlene Coria on 05/18/2021 14:05:17 [name] ([id number]) -------------------------------------------------------------------------------- Patient/Caregiver Education Details Patient Name: [name] Date of Service: 05/18/2021 2:00 PM Medical Record Number: [id number] Patient Account Number: [id number] Date of Birth/Gender: 1980/08/09 (40 y.o. M) Treating RN: Carlene Coria Primary Care Physician: [name] Other Clinician: Referring Physician: [name] Treating Physician/Extender: Tito Dine in Treatment: 33 Education Assessment Education Provided To: Patient Education Topics Provided Wound Debridement: Methods: Explain/Verbal Responses: State content correctly Electronic Signature(s) Signed: 05/18/2021 4:17:41 PM By: Carlene Coria RN Entered By: Carlene Coria on 05/18/2021 15:17:00 [name] ([id number]) -------------------------------------------------------------------------------- Wound Assessment Details Patient Name: [name] Date of Service: 05/18/2021 2:00 PM Medical Record Number: [id number] Patient Account Number: [id number] Date of Birth/Sex: [birth year] (40 y.o. M) Treating RN: Carlene Coria Primary Care Rubert Frediani: [name] Other Clinician: Referring Nathifa Ritthaler: [name] Treating Quinteria Chisum/Extender: Tito Dine in Treatment: 232 Wound Status Wound Number: 1 Primary Etiology: Pyoderma Wound Location: Left, Lateral Lower Leg Wound Status: Open Wounding Event: Gradually Appeared Comorbid History: Sleep Apnea, Hypertension, Colitis Date Acquired: 11/18/2015 Weeks Of Treatment: 232 Clustered Wound: No Photos Wound Measurements Length: (cm) 2.5 Width: (cm) 2 Depth: (cm) 0.1 Area: (cm) 3.927 Volume: (cm) 0.393 % Reduction in Area: 20% % Reduction in Volume: 90% Epithelialization: Medium (34-66%) Tunneling: No Undermining: No Wound Description Classification: Full Thickness With Exposed Support Structures Wound Margin: Flat and Intact Exudate Amount: Medium Exudate Type: Serosanguineous Exudate Color: red, brown Foul Odor After Cleansing: No Slough/Fibrino Yes Wound Bed Granulation Amount: Large (67-100%) Exposed Structure Granulation Quality: Red Fascia Exposed: No Necrotic Amount: Small (1-33%) Fat Layer (Subcutaneous Tissue) Exposed: Yes Necrotic Quality: Adherent Slough Tendon Exposed: No Muscle Exposed: No Joint Exposed: No Bone Exposed: No Treatment Notes Wound #1 (Lower Leg) Wound Laterality: Left, Lateral Cleanser Wound Cleanser Discharge Instruction: Wash your [name] with soap and water. Remove old dressing, discard into plastic bag and place into trash. Cleanse the wound with Wound Cleanser prior to applying a clean dressing using gauze sponges, not tissues or cotton balls. Do not scrub or use excessive force. Pat dry using gauze sponges, not tissue or cotton balls. [name], [name] ([id number]) [place] Lotion Discharge Instruction: Suggestions: Theraderm, Eucerin, Cetaphil, or patient preference. Topical Primary Dressing Secondary Dressing Xtrasorb Medium 4x5 (in/in) Discharge Instruction: Apply to wound as directed. Do not cut. Secured  With Compression Wrap Medichoice 4 layer Compression System, 35-40 mmHG Discharge  Instruction: Apply multi-layer wrap as directed. Compression Stockings Add-Ons Electronic Signature(s) Signed: 05/18/2021 4:17:41 PM By: Carlene Coria RN Entered By: Carlene Coria on 05/18/2021 14:14:35 [name] ([id number]) -------------------------------------------------------------------------------- Vitals Details Patient Name: [name] Date of Service: 05/18/2021 2:00 PM Medical Record Number: [id number] Patient Account Number: [id number] Date of Birth/Sex: October 29, 1980 (40 y.o. M) Treating RN: Carlene Coria Primary Care Mikael Skoda: [name] Other Clinician: Referring Rochester Serpe: [name] Treating Nicle Connole/Extender: Tito Dine in Treatment: 232 Vital Signs Time Taken: 14:04 Temperature (F): 98.2 Height (in): 71 Pulse (bpm): 92 Weight (lbs): 338 Respiratory Rate (breaths/min): 18 Body Mass Index (BMI): 47.1 Blood Pressure (mmHg): 169/94 Reference Range: 80 - 120 mg / dl Electronic Signature(s) Signed: 05/18/2021 4:17:41 PM By: Carlene Coria RN Entered By: Carlene Coria on 05/18/2021 14:05:06

## 2021-05-24 ENCOUNTER — Other Ambulatory Visit: Payer: Self-pay

## 2021-05-24 ENCOUNTER — Encounter: Payer: 59 | Attending: Physician Assistant

## 2021-05-24 DIAGNOSIS — L97222 Non-pressure chronic ulcer of left calf with fat layer exposed: Secondary | ICD-10-CM | POA: Insufficient documentation

## 2021-05-24 DIAGNOSIS — L88 Pyoderma gangrenosum: Secondary | ICD-10-CM | POA: Diagnosis not present

## 2021-05-24 DIAGNOSIS — F17208 Nicotine dependence, unspecified, with other nicotine-induced disorders: Secondary | ICD-10-CM | POA: Insufficient documentation

## 2021-05-24 DIAGNOSIS — L97213 Non-pressure chronic ulcer of right calf with necrosis of muscle: Secondary | ICD-10-CM | POA: Insufficient documentation

## 2021-05-24 DIAGNOSIS — I872 Venous insufficiency (chronic) (peripheral): Secondary | ICD-10-CM | POA: Insufficient documentation

## 2021-05-24 DIAGNOSIS — E11622 Type 2 diabetes mellitus with other skin ulcer: Secondary | ICD-10-CM | POA: Diagnosis not present

## 2021-05-24 DIAGNOSIS — L97321 Non-pressure chronic ulcer of left ankle limited to breakdown of skin: Secondary | ICD-10-CM | POA: Diagnosis not present

## 2021-05-24 NOTE — Progress Notes (Signed)
ULISES, WOLFINGER ([id number]) Visit Report for 05/24/2021 Arrival Information Details Patient Name: [name], [name] Date of Service: 05/24/2021 8:00 AM Medical Record Number: [id number] Patient Account Number: [id number] Date of Birth/Sex: [birth year] (41 y.o. M) Treating RN: [name] Primary Care Demisha Nokes: [name] Other Clinician: Referring Donnamarie Shankles: [name] Treating Bridgett Hattabaugh/Extender: [name] Cliche in Treatment: 56 Visit Information History Since Last Visit Has Dressing in Place as Prescribed: Yes Patient Arrived: Ambulatory Has Compression in Place as Prescribed: Yes Arrival Time: 08:15 Pain Present Now: No Transfer Assistance: None Patient Identification Verified: Yes Secondary Verification Process Completed: Yes Patient Requires Transmission-Based No Precautions: Patient Has Alerts: Yes Patient Alerts: Patient has reaction to silver dressings. Electronic Signature(s) Signed: 05/24/2021 1:58:43 PM By: [name], [name], RN, [name], [name], [name] Entered By: [name], [name], RN, [name], [name] on 05/24/2021 08:32:01 LEWELLYN, FULTZ ([id number]) -------------------------------------------------------------------------------- Compression Therapy Details Patient Name: [name] Date of Service: 05/24/2021 8:00 AM Medical Record Number: [id number] Patient Account Number: [id number] Date of Birth/Sex: Jun 30, 1980 (40 y.o. M) Treating RN: [name] Primary Care Emanual Lamountain: [name] Other Clinician: Referring Simone Rodenbeck: [name] Treating Bich Mchaney/Extender: [name] Cliche in Treatment: 233 Compression Therapy Performed for Wound Assessment: Wound #1 Left,Lateral Lower Leg Performed By: Clinician [name], RN Compression Type: Four Layer Pre Treatment ABI: 1.2 Electronic Signature(s) Signed: 05/24/2021 1:58:43 PM By: [name], [name], RN, [name], [name], [name] Entered By: [name], [name], RN, [name], [name] on 05/24/2021 08:32:56 [name]  ([id number]) -------------------------------------------------------------------------------- Encounter Discharge Information Details Patient Name: [name] Date of Service: 05/24/2021 8:00 AM Medical Record Number: [id number] Patient Account Number: [id number] Date of Birth/Sex: December 26, 1980 (40 y.o. M) Treating RN: [name] Primary Care Ilani Otterson: [name] Other Clinician: Referring Lowen Barringer: [name] Treating Zendayah Hardgrave/Extender: [name] Cliche in Treatment: 233 Encounter Discharge Information Items Discharge Condition: Stable Ambulatory Status: Ambulatory Discharge Destination: Home Transportation: Private Auto Schedule Follow-up Appointment: Yes Clinical Summary of Care: Electronic Signature(s) Signed: 05/24/2021 1:58:43 PM By: [name], [name], RN, [name], [name], [name] Entered By: [name], [name], RN, [name], [name] on 05/24/2021 08:33:34 [name] ([id number]) -------------------------------------------------------------------------------- Wound Assessment Details Patient Name: [name] Date of Service: 05/24/2021 8:00 AM Medical Record Number: [id number] Patient Account Number: [id number] Date of Birth/Sex: Nov 25, 1980 (40 y.o. M) Treating RN: [name] Primary Care Anaisabel Pederson: [name] Other Clinician: Referring Joriel Streety: [name] Treating Oaklen Thiam/Extender: [name] Cliche in Treatment: 233 Wound Status Wound Number: 1 Primary Etiology: Pyoderma Wound Location: Left, Lateral Lower Leg Wound Status: Open Wounding Event: Gradually Appeared Comorbid History: Sleep Apnea, Hypertension, Colitis Date Acquired: 11/18/2015 Weeks Of Treatment: 233 Clustered Wound: No Wound Measurements Length: (cm) 2.5 Width: (cm) 2 Depth: (cm) 0.1 Area: (cm) 3.927 Volume: (cm) 0.393 % Reduction in Area: 20% % Reduction in Volume: 90% Epithelialization: Medium (34-66%) Wound Description Classification: Full Thickness With Exposed Support Structures Wound Margin: Flat  and Intact Exudate Amount: Medium Exudate Type: Serosanguineous Exudate Color: red, brown Foul Odor After Cleansing: No Slough/Fibrino Yes Wound Bed Granulation Amount: Large (67-100%) Exposed Structure Granulation Quality: Red Fascia Exposed: No Necrotic Amount: Small (1-33%) Fat Layer (Subcutaneous Tissue) Exposed: Yes Necrotic Quality: Adherent Slough Tendon Exposed: No Muscle Exposed: No Joint Exposed: No Bone Exposed: No Treatment Notes Wound #1 (Lower Leg) Wound Laterality: Left, Lateral Cleanser Wound Cleanser Discharge Instruction: Wash your hands with soap and water. Remove old dressing, discard into plastic bag and place into trash. Cleanse the wound with Wound Cleanser prior to applying a clean dressing using gauze sponges, not tissues or cotton balls. Do not scrub  or use excessive force. Pat dry using gauze sponges, not tissue or cotton balls. Peri-Wound Care Moisturizing Lotion Discharge Instruction: Suggestions: Theraderm, Eucerin, Cetaphil, or patient preference. Topical Primary Dressing Secondary Dressing Xtrasorb Medium 4x5 (in/in) Discharge Instruction: Apply to wound as directed. Do not cut. Secured With [employer], [place] ([id number]) Medichoice 4 layer Compression System, 35-40 mmHG Discharge Instruction: Apply multi-layer wrap as directed. Compression Stockings Add-Ons Electronic Signature(s) Signed: 05/24/2021 1:58:43 PM By: [name], [name], RN, [name], [name], [name] Entered By: [name], [name], RN, [name], [name] on 05/24/2021 30:10:40

## 2021-05-26 ENCOUNTER — Other Ambulatory Visit: Payer: Self-pay | Admitting: Primary Care

## 2021-05-26 DIAGNOSIS — E119 Type 2 diabetes mellitus without complications: Secondary | ICD-10-CM

## 2021-05-28 ENCOUNTER — Encounter: Payer: 59 | Admitting: Physician Assistant

## 2021-05-28 ENCOUNTER — Other Ambulatory Visit: Payer: Self-pay

## 2021-05-28 DIAGNOSIS — I872 Venous insufficiency (chronic) (peripheral): Secondary | ICD-10-CM | POA: Diagnosis not present

## 2021-05-28 NOTE — Progress Notes (Signed)
MIKEL, HARDGROVE ([id number]) Visit Report for 05/28/2021 Arrival Information Details Patient Name: [name], [name] Date of Service: 05/28/2021 3:15 PM Medical Record Number: [id number] Patient Account Number: [id number] Date of Birth/Sex: Apr 13, 1981 (41 y.o. M) Treating RN: [name] Primary Care Bhumi Godbey: [name] Other Clinician: Referring Samyiah Halvorsen: [name] Treating Brittinee Risk/Extender: [name] Cliche in Treatment: 29 Visit Information History Since Last Visit Added or deleted any medications: No Patient Arrived: Ambulatory Has Dressing in Place as Prescribed: Yes Arrival Time: 15:30 Has Compression in Place as Prescribed: Yes Accompanied By: self Pain Present Now: No Transfer Assistance: None Patient Identification Verified: Yes Secondary Verification Process Completed: Yes Patient Requires Transmission-Based No Precautions: Patient Has Alerts: Yes Patient Alerts: Patient has reaction to silver dressings. Electronic Signature(s) Signed: 05/28/2021 4:38:47 PM By: [name], [name], RN, [name], [name], [name] Entered By: [name], [name], RN, [name], [name] on 05/28/2021 15:30:42 JAREL, CUADRA ([id number]) -------------------------------------------------------------------------------- Encounter Discharge Information Details Patient Name: [name] Date of Service: 05/28/2021 3:15 PM Medical Record Number: [id number] Patient Account Number: [id number] Date of Birth/Sex: January 03, 1981 (40 y.o. M) Treating RN: [name] Primary Care Charlee Whitebread: [name] Other Clinician: Referring Dashawn Bartnick: [name] Treating Glendoris Nodarse/Extender: [name] Cliche in Treatment: 233 Encounter Discharge Information Items Discharge Condition: Stable Ambulatory Status: Ambulatory Discharge Destination: Home Transportation: Private Auto Accompanied By: self Schedule Follow-up Appointment: Yes Clinical Summary of Care: Electronic Signature(s) Signed: 05/28/2021 4:38:47 PM By: [name], [name], RN, [name], [name]  RN, [name] Entered By: [name], [name], RN, [name], [name] on 05/28/2021 15:58:30 [name] ([id number]) -------------------------------------------------------------------------------- Lower Extremity Assessment Details Patient Name: [name] Date of Service: 05/28/2021 3:15 PM Medical Record Number: [id number] Patient Account Number: [id number] Date of Birth/Sex: 1980-10-04 (40 y.o. M) Treating RN: [name] Primary Care Shakyla Nolley: [name] Other Clinician: Referring Nettie Cromwell: [name] Treating Obdulio Mash/Extender: [name] Cliche in Treatment: 233 Edema Assessment Assessed: [Left: No] [Right: No] [Left: Edema] [Right: :] Calf Left: Right: Point of Measurement: 35 cm From Medial Instep 48 cm Ankle Left: Right: Point of Measurement: 10 cm From Medial Instep 29 cm Vascular Assessment Pulses: Dorsalis Pedis Palpable: [Left:Yes] Electronic Signature(s) Signed: 05/28/2021 4:38:47 PM By: [name], [name], RN, [name], [name], [name] Entered By: [name], [name], RN, [name], [name] on 05/28/2021 15:34:16 [name] ([id number]) -------------------------------------------------------------------------------- Multi Wound Chart Details Patient Name: [name] Date of Service: 05/28/2021 3:15 PM Medical Record Number: [id number] Patient Account Number: [id number] Date of Birth/Sex: [birth year] (40 y.o. 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] [N/A:N/A] Comorbid History: [1:Sleep Apnea, Hypertension, Colitis]  [N/A:N/A] Date Acquired: [1:11/18/2015] [N/A:N/A] Weeks of Treatment: [1:233] [N/A:N/A] Wound Status: [1:Open] [N/A:N/A] Measurements L x W x D (cm) [1:2.5x2x0.1] [N/A:N/A] Area (cm) : [1:3.927] [N/A:N/A] Volume (cm) : [1:0.393] [N/A:N/A] % Reduction in Area: [1:20.00%] [N/A:N/A] % Reduction in Volume: [1:90.00%] [N/A:N/A] Classification: [1:Full Thickness With Exposed Support Structures] [N/A:N/A] Exudate Amount: [1:Medium] [N/A:N/A] Exudate Type: [1:Serosanguineous] [N/A:N/A] Exudate Color: [1:red, brown] [N/A:N/A] Wound Margin: [1:Flat and Intact] [N/A:N/A] Granulation Amount: [1:Medium (34-66%)] [N/A:N/A] Granulation Quality: [1:Red, Hyper-granulation] [N/A:N/A] Necrotic Amount: [1:Medium (34-66%)] [N/A:N/A] Exposed Structures: [1:Fat Layer (Subcutaneous Tissue): Yes Fascia: No Tendon: No Muscle: No Joint: No Bone: No Medium (34-66%)] [N/A:N/A N/A] Treatment Notes Electronic Signature(s) Signed: 05/28/2021 4:38:47  PM By: [name], [name], RN, [name], [name], [name] Entered By: [name], [name], RN, [name], [name] on 05/28/2021 15:38:22 NAQUAN, GARMAN ([id number]) -------------------------------------------------------------------------------- Multi-Disciplinary Care Plan Details Patient Name: [name] Date of Service: 05/28/2021 3:15 PM Medical Record Number: [id number] Patient Account Number: [id number] Date of Birth/Sex: 1980/11/22 (40 y.o. M) Treating RN: [name] Primary Care Rashelle Ireland: [name] Other Clinician: Referring Duval Macleod: [name] Treating Carlie Corpus/Extender: [name] Cliche in Treatment: 41 [place] reviewed with physician Active Inactive Necrotic Tissue Nursing Diagnoses: Impaired tissue integrity related to necrotic/devitalized tissue Knowledge deficit related to management of necrotic/devitalized tissue Goals: Necrotic/devitalized tissue will be minimized in the wound bed Date Initiated: 03/26/2021 Target Resolution Date: 05/26/2021 Goal  Status: Active Patient/caregiver will verbalize understanding of reason and process for debridement of necrotic tissue Date Initiated: 03/26/2021 Date Inactivated: 04/27/2021 Target Resolution Date: 03/26/2021 Goal Status: Met Interventions: Assess patient pain level pre-, during and post procedure and prior to discharge Provide education on necrotic tissue and debridement process Treatment Activities: Apply topical anesthetic as ordered : 03/26/2021 Notes: Electronic Signature(s) Signed: 05/28/2021 4:38:47 PM By: [name], [name], RN, [name], [name], [name] Entered By: [name], [name], RN, [name], [name] on 05/28/2021 15:34:31 [name] ([id number]) -------------------------------------------------------------------------------- Pain Assessment Details Patient Name: [name] Date of Service: 05/28/2021 3:15 PM Medical Record Number: [id number] Patient Account Number: [id number] Date of Birth/Sex: [birth year] (40 y.o. M) Treating RN: [name] Primary Care Keifer Habib: [name] Other Clinician: Referring Zamirah Denny: [name] Treating Tayshaun Kroh/Extender: [name] Cliche in Treatment: 233 Active Problems Location of Pain Severity and Description of Pain Patient Has Paino No Site Locations Pain Management and Medication Current Pain Management: Electronic Signature(s) Signed: 05/28/2021 4:38:47 PM By: [name], [name], RN, [name], [name], [name] Entered By: [name], [name], RN, [name], [name] on 05/28/2021 15:31:22 [name] ([id number]) -------------------------------------------------------------------------------- Patient/Caregiver Education Details Patient Name: [name] Date of Service: 05/28/2021 3:15 PM Medical Record Number: [id number] Patient Account Number: [id number] Date of Birth/Gender: Nov 25, 1980 (40 y.o. M) Treating RN: [name] Primary Care Physician: [name] Other Clinician: Referring Physician: [name] Treating Physician/Extender: [name] Cliche in Treatment:  233 Education Assessment Education Provided To: Patient Education Topics Provided Venous: Handouts: Controlling Swelling with Multilayered Compression Wraps Wound/Skin Impairment: Engineer, maintenance) Signed: 05/28/2021 4:38:47 PM By: [name], [name], RN, [name], [name], [name] Entered By: [name], [name], RN, [name], [name] on 05/28/2021 15:57:28 [name] ([id number]) -------------------------------------------------------------------------------- Wound Assessment Details Patient Name: [name] Date of Service: 05/28/2021 3:15 PM Medical Record Number: [id number] Patient Account Number: [id number] Date of Birth/Sex: January 04, 1981 (40 y.o. M) Treating RN: [name] Primary Care Lilliahna Schubring: [name] Other Clinician: Referring Laquentin Loudermilk: [name] Treating Dama Hedgepeth/Extender: [name] Cliche in Treatment: 233 Wound Status Wound Number: 1 Primary Etiology: Pyoderma Wound Location: Left, Lateral Lower Leg Wound Status: Open Wounding Event: Gradually Appeared Comorbid History: Sleep Apnea, Hypertension, Colitis Date Acquired: 11/18/2015 Weeks Of Treatment: 233 Clustered Wound: No Photos Photo Uploaded By: [name], [name], RN, [name], [name] on 05/28/2021 16:35:20 Wound Measurements Length: (cm) 2.5 Width: (cm) 2 Depth: (cm) 0.1 Area: (cm) 3.927 Volume: (cm) 0.393 % Reduction in Area: 20% % Reduction in Volume: 90% Epithelialization: Medium (34-66%) Tunneling: No Undermining: No Wound Description Classification: Full Thickness With Exposed Support Structures Wound Margin: Flat and Intact Exudate Amount: Medium Exudate Type: Serosanguineous Exudate Color: red, brown Foul Odor After Cleansing: No Slough/Fibrino Yes Wound Bed Granulation Amount: Medium (34-66%) Exposed Structure Granulation Quality: Red, Hyper-granulation Fascia Exposed: No Necrotic Amount: Medium (34-66%) Fat Layer (Subcutaneous Tissue) Exposed:  Yes Necrotic Quality: Adherent Slough Tendon Exposed: No Muscle  Exposed: No Joint Exposed: No Bone Exposed: No Treatment Notes Wound #1 (Lower Leg) Wound Laterality: Left, Lateral Cleanser Wound Cleanser Discharge Instruction: Wash your hands with soap and water. Remove old dressing, discard into plastic bag and place into trash. Cleanse the wound with Wound Cleanser prior to applying a clean dressing using gauze sponges, not tissues or cotton balls. Do not Mounsey, Claudius ([id number]) scrub or use excessive force. Pat dry using gauze sponges, not tissue or cotton balls. Peri-Wound Care Moisturizing Lotion Discharge Instruction: Suggestions: Theraderm, Eucerin, Cetaphil, or patient preference. Topical Clobetasol Propionate ointment 0.05%, 60 (g) tube Primary Dressing Hydrofera Blue Ready Transfer Foam, 4x5 (in/in) Discharge Instruction: Apply Hydrofera Blue Ready to wound bed as directed Secondary Dressing Xtrasorb Medium 4x5 (in/in) Discharge Instruction: Apply to wound as directed. Do not cut. Secured With Compression Wrap Medichoice 4 layer Compression System, 35-40 mmHG Discharge Instruction: Apply multi-layer wrap as directed. Compression Stockings Environmental education officer) Signed: 05/28/2021 4:38:47 PM By: [name], [name], RN, [name], [name], [name] Entered By: [name], [name], RN, [name], [name] on 05/28/2021 15:32:40 ALBERT, HERSCH ([id number]) -------------------------------------------------------------------------------- [place] Details Patient Name: [name] Date of Service: 05/28/2021 3:15 PM Medical Record Number: [id number] Patient Account Number: [id number] Date of Birth/Sex: 05/04/1981 (40 y.o. M) Treating RN: [name] Primary Care Jemario Poitras: [name] Other Clinician: Referring Rodney Yera: [name] Treating Natonya Finstad/Extender: [name] Cliche in Treatment: 233 Vital Signs Time Taken: 15:20 Temperature (F): 98.4 Height (in): 71 Pulse (bpm): 111 Weight (lbs): 338 Respiratory Rate (breaths/min): 18 Body Mass Index (BMI):  47.1 Blood Pressure (mmHg): 139/78 Reference Range: 80 - 120 mg / dl Electronic Signature(s) Signed: 05/28/2021 4:38:47 PM By: [name], [name], RN, [name], [name], [name] Entered By: [name], [name], RN, [name], [name] on 05/28/2021 15:31:13

## 2021-05-28 NOTE — Progress Notes (Addendum)
TALLIN, HART ([id number]) Visit Report for 05/28/2021 Chief Complaint Document Details Patient Name: [name], [name] Date of Service: 05/28/2021 3:15 PM Medical Record Number: [id number] Patient Account Number: [id number] Date of Birth/Sex: 09/19/1980 (41 y.o. 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 M) Treating RN: [name] Primary Care Provider: [name] Other Clinician: Referring Provider: [name] Treating Provider/Extender: [name] Cliche in Treatment: 27 History of Present Illness HPI Description: 12/04/16; 41 year old man who comes into the clinic today for review of a wound on the posterior left calf. He tells me that is been there for about a year. He is not a diabetic he does smoke half a pack per day. He was seen in the ER on 11/20/16 felt to have cellulitis around the wound and was given clindamycin. An x-ray did not show osteomyelitis. The patient initially tells me that he has a milk allergy that sets off a pruritic itching rash on his lower legs which she scratches incessantly and he thinks that's what may have set up the wound. He has been using various topical antibiotics and ointments without any effect. He works in a [employer] and is on his feet all day. He does not have a prior history of  wounds however he does have the rash on both lower legs the right arm and the ventral aspect of his left arm. These are excoriations and clearly have had scratching however there are of macular looking areas on both legs including a substantial larger area on the right leg. This does not have an underlying open area. There is no blistering. The patient tells me that 2 years ago in [place] in response to the rash on his legs he saw a dermatologist who told him he had a condition which may be pyoderma gangrenosum although I may be putting words into his mouth. He seemed to recognize this. On further questioning he admits to a 5 year history of quiesced. ulcerative colitis. He is not in any treatment for this. He's had no recent travel 12/11/16; the patient arrives today with his wound and roughly the same condition we've been using silver alginate this is a deep punched out wound with some surrounding erythema but no tenderness. Biopsy I did did not show confirmed pyoderma gangrenosum suggested nonspecific inflammation and vasculitis but does not provide an actual description of what was seen by the pathologist. I'm really not able to understand this We have also received information from the patient's dermatologist in [place] notes from April 2016. This was a doctor Agarwal-antal. 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 He was prescribed topical steroid high potency under occlusion which helped but at this point the patient did not have a deep punched out wound. 12/18/16; the patient's wound is larger in terms of surface area however this surface looks better and there is less depth. The surrounding erythema also is better. The patient states that the wrap we put on came off 2 days ago when he has been using his compression stockings.  He we are in the process of getting a dermatology consult. 12/26/16 on evaluation today patient's left lower extremity wound shows evidence of infection with  surrounding erythema noted. He has been tolerating the dressing changes but states that he has noted more discomfort. There is a larger area of erythema surrounding the wound. No fevers, chills, nausea, or vomiting noted at this time. With that being said the wound still does have slough covering the surface. He is not allergic to any medication that he is aware of at this point. In regard to his right lower extremity he had several regions that are erythematous and pruritic he wonders if there's anything we can do to help that. 01/02/17 I reviewed patient's wound culture which was obtained his visit last week. He was placed on doxycycline at that point. Unfortunately that does not appear to be an antibiotic that would likely help with the situation however the pseudomonas noted on culture is sensitive to Cipro. Also unfortunately patient's wound seems to have a large compared to last week's evaluation. Not severely so but there are definitely increased measurements in general. He is continuing to have discomfort as well he writes this to be a seven out of 10. In fact he would prefer me not to perform any debridement today due to the fact that he is having discomfort and considering he has an active infection on the little reluctant to do so anyway. No fevers, chills, nausea, or vomiting noted at this time. 01/08/17; patient seems dermatology on September 5. I suspect dermatology will want the slides from the biopsy I did sent to their pathologist. I'm not sure if there is a way we can expedite that. In any case the culture I did before I left on vacation 3 weeks ago showed Pseudomonas he was given 10 days of Cipro and per her description of her intake nurses is actually somewhat better this week although the wound is quite a bit bigger than I remember the last time I saw this. He still has 3 more days of Cipro 01/21/17; dermatology appointment tomorrow. He has completed the ciprofloxacin for Pseudomonas.  Surface of the wound looks better however he is had some deterioration in the lesions on his right leg. Meantime the left lateral leg wound we will continue with sample 01/29/17; patient had his dermatology appointment but I can't yet see that note. He is completed his antibiotics. The wound is more superficial but considerably larger in circumferential area than when he came in. This is in his left lateral calf. He also has swollen erythematous areas with superficial wounds on the right leg and small papular areas on both arms. There apparently areas in her his upper thighs and buttocks I did not look at those. Dermatology biopsied the right leg. Hopefully will have their input next week. 02/05/17; patient went back to see his dermatologist who told him that he had a "scratching problem" as well as staph. He is now on a 30 day course of doxycycline and I believe she gave him triamcinolone cream to the right leg areas to help with the itching [not exactly sure but probably triamcinolone]. She apparently looked at the left lateral leg wound although this was not rebiopsied and I think felt to be ultimately part of the same pathogenesis. He is using sample border foam and changing nevus himself. He now has a new open area on the right posterior leg which was his biopsy site I don't have any of the dermatology notes  02/12/17; we put the patient in compression last week with SANTYL to the wound on the left leg and the biopsy. Edema is much better and the depth of the wound is now at level of skin. Area is still the same oBiopsy site on the right lateral leg we've also been using santyl with a border foam dressing and he is changing this himself. 02/19/17; Using silver alginate started last week to both the substantial left leg wound and the biopsy site on the right wound. He is tolerating compression well. Has a an appointment with his primary M.D. tomorrow wondering about diuretics although I'm wondering if  the edema problem is actually lymphedema 02/26/17; the patient has been to see his primary doctor Dr. Jerrel Ivory at [place] our primary care. She started him on Lasix 20 mg and this seems to have helped with the edema. However we are not making substantial change with the left lateral calf wound and inflammation. The biopsy site on the right leg also looks stable but not really all that different. 03/12/17; the patient has been to see vein and vascular Dr. Lucky Cowboy. He has had venous reflux studies I have not reviewed these. I did get a call from his dermatology office. They felt that he might have pathergy based on their biopsy on his right leg which led them to look at the slides of [name], [name] ([id number]) the biopsy I did on the left leg and they wonder whether this represents pyoderma gangrenosum which was the original supposition in a man with ulcerative colitis albeit inactive for many years. They therefore recommended clobetasol and tetracycline i.e. aggressive treatment for possible pyoderma gangrenosum. 03/26/17; apparently the patient just had reflux studies not an appointment with Dr. dew. She arrives in clinic today having applied clobetasol for 2-3 weeks. He notes over the last 2-3 days excessive drainage having to change the dressing 3-4 times a day and also expanding erythema. 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 Lillia Carmel at [facility] dermatology locally who kindly saw him at my request. 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 My concern was the degree of erythema medially extending from his left leg wound which was either inflammation from pyoderma or cellulitis. I  put him on Augmentin however culture of the wound showed Pseudomonas which is quinolone sensitive. I really don't believe he has cellulitis however in view of everything I will continue and give him a course of Cipro. He is also on doxycycline as an immune modulator for the pyoderma. In addition to his original wound on the left lateral leg with surrounding erythema he has a wound on the right posterior calf which was an original biopsy site done by dermatology. 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 Saw Dr. Lillia Carmel yesterday. He has been using topical antibiotics to both wound areas his original wound on the left and the biopsies/pathergy area on the right. There is definitely some improvement in the inflammation around the wound on the right although the patient states he has increasing sensitivity of the wounds. He is on prednisone 60 and doxycycline 1 as prescribed by Dr. Lillia Carmel. He is covering the topical antibiotic with gauze  and putting this in his own compression stocks and changing this daily. He states that Dr. Lottie Rater did a culture of the left leg wound yesterday 05/07/17; pyoderma gangrenosum. The patient saw Dr. Lillia Carmel yesterday and has a follow-up with her in one month. He is still using topical antibiotics to both wounds although he can't recall exactly what type. He is still on prednisone 60 mg. Dr. Lillia Carmel stated that the doxycycline could stop if we were in agreement. 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 The right medial leg was induced by biopsy/pathergy. The area on the right is essentially healed. 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 The right medial leg has closed and remains closed. He is still on  prednisone 60. oHe tells me he missed his last dermatology appointment with Dr. Lillia Carmel but will make another appointment. He reports that her blood sugar at a recent screen in [place] was high 200's. He was 180 today. 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 I think he is going to require still much longer prednisone perhaps another 3 months before attempting to taper. In the meantime his wound is a lot better. Smaller. He is cleaning this off daily and applying topical antibiotics. When he was last in the clinic I thought about changing to [facility] and actually put in a couple of calls to dermatology although probably not during their business hours. 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 He continues on oral prednisone. He has been using triple antibiotic ointment. There is surface debris and we will transition to [facility] and have him return in 2 weeks. He has lost 30 pounds since his last appointment with lifestyle modification. He may benefit from topical steroid cream for treatment this can be considered at a later date. 08/22/17 on evaluation today patient appears to actually be doing rather well in regard to his left lateral lower extremity ulcer. He has actually been managed by Dr. Dellia Nims most recently. Patient is currently on oral steroids at this time. This seems to have been of benefit for him. Nonetheless his last visit was actually with Leah on 08/06/17. Currently he is not utilizing any topical steroid creams although this could be of benefit as well. No fevers, chills, nausea, or vomiting noted at this time. 09/05/17 on evaluation today patient appears to be doing better in regard to his left lateral lower extremity ulcer. He has been tolerating the dressing changes without complication. He is using Santyl with good effect. Overall I'm very  pleased with how things are standing at this point. Patient likewise is happy that this is doing better. 09/19/17 on evaluation today patient actually appears to be doing rather well in regard to his left lateral lower extremity ulcer. 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 He's not having any significant pain. 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 He has been tolerating the Santyl without complication and in general I feel like he's making good progress. 10/17/17 on evaluation today patient  appears to be doing very well in regard to his left lateral lower surety ulcer. He has been tolerating the dressing changes without complication. There does not appear to be any evidence of infection he's alternating the Santyl and the triple antibiotic ointment every other day this seems to be doing well for him. 11/03/17 on evaluation today patient appears to be doing very well in regard to his left lateral lower extremity ulcer. He is been tolerating the dressing changes without complication which is good news. Fortunately there does not appear to be any evidence of infection which is also great news. Overall is doing excellent they are starting to taper down on the prednisone is down to 40 mg at this point it also started topical clobetasol for him. 11/17/17 on evaluation today patient appears to be doing well in regard to his left lateral lower surety ulcer. He's been tolerating the dressing changes without complication. He does note that he is having no pain, no excessive drainage or discharge, and overall he feels like things are going about how he would expect and hope they would. Overall he seems to have no evidence of infection at this time in my opinion which is good news. 12/04/17-He is seen in follow-up evaluation  for right lateral lower extremity ulcer. He has been applying topical steroid cream. Today's measurement show slight increase in size. Over the next 2 weeks we will transition to every other day Santyl and steroid cream. He has been encouraged to monitor for changes and notify clinic with any concerns 12/15/17 on evaluation today patient's left lateral motion the ulcer and fortunately is doing worse again at this point. This just since last week to this week has close to doubled in size according to the patient. I did not seeing last week's I do not have a visual to compare this to in our system was also down so we do not have all the charts and at this point. Nonetheless it does have me somewhat concerned in regard to the fact that again he was worried enough about it he has contact the dermatology that placed them back on the full strength, 50 mg a day of the prednisone that he was taken previous. He continues to alternate using clobetasol along with Santyl at this point. He is obviously somewhat frustrated. 12/22/17 on evaluation today patient appears to be doing a little worse compared to last evaluation. Unfortunately the wound is a little deeper and slightly larger than the last week's evaluation. With that being said he has made some progress in regard to the irritation surrounding at this time unfortunately despite that progress that's been made he still has a significant issue going on here. 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 [name], [name] ([id number]) For that reason I discussed with him today the possibility of perform a wound culture to ensure there's nothing overtly infected. 01/06/18 on evaluation today patient's wound is larger and deeper than previously evaluated. With that being said it did appear that his wound was infected after  my last evaluation with him. Subsequently I did end up prescribing a prescription for Bactrim DS which she has been taking and having no complication with. Fortunately there does not appear to be any evidence of infection at this point in time as far as anything spreading, no want to touch, and overall I feel like  things are showing signs of improvement. 01/13/18 on evaluation today patient appears to be even a little larger and deeper than last time. There still muscle exposed in the base of the wound. Nonetheless he does appear to be less erythematous I do believe inflammation is calming down also believe the infection looks like it's probably resolved at this time based on what I'm seeing. No fevers, chills, nausea, or vomiting noted at this time. 01/30/18 on evaluation today patient actually appears to visually look better for the most part. Unfortunately those visually this looks better he does seem to potentially have what may be an abscess in the muscle that has been noted in the central portion of the wound. This is the first time that I have noted what appears to be fluctuance in the central portion of the muscle. With that being said I'm somewhat more concerned about the fact that this might indicate an abscess formation at this location. I do believe that an ultrasound would be appropriate. This is likely something we need to try to do as soon as possible. He has been switch to mupirocin ointment and he is no longer using the steroid ointment as prescribed by dermatology he sees them again next week he's been decreased from 60 to 40 mg of prednisone. 03/09/18 on evaluation today patient actually appears to be doing a little better compared to last time I saw him. There's not as much erythema surrounding the wound itself. He I did review his most recent infectious disease note which was dated 02/24/18. He saw Dr. Michel Bickers in [place]. 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 Patient is now off of antibiotics and they are continually observing this. There seems to be no change in the past two weeks in my pinion based on what the patient says and what I see today compared to what Dr. Megan Salon likely saw two weeks ago. No fevers, chills, nausea, or vomiting noted at this time. 03/23/18 on evaluation today patient's wound actually appears to be showing signs of improvement which is good news. He is currently still on the Dapsone. He is also working on tapering the prednisone to get off of this and Dr. Lottie Rater is working with him in this regard. Nonetheless overall I feel like the wound is doing well it does appear based on the infectious disease note that I reviewed from Dr. 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 I did also review the results of his ultrasound of left lower extremity which revealed there was a dentist tissue in the base of the wound without an abscess noted. 04/06/18 on evaluation today the patient's left lateral lower extremity ulcer actually appears to be doing fairly well which is excellent news. There does not appear to be any evidence of infection at this time which is also great news. Overall he still does have a significantly large ulceration although little by little he seems to be making progress. He is down to 10 mg a day of the prednisone. 04/20/18 on evaluation today patient actually appears to be doing excellent at this time in regard to his left lower extremity ulcer. He's making signs of good progress unfortunately this is taking much longer than we would really like to see but nonetheless he is  making progress. Fortunately there does not appear to be any evidence of infection at this time. No fevers, chills, nausea, or vomiting noted at this time.  The patient has not been using the Santyl due to the cost he hadn't got in this field yet. He's mainly been using the antibiotic ointment topically. Subsequently he also tells me that he really has not been scrubbing in the shower I think this would be helpful again as I told him it doesn't have to be anything too aggressive to even make it believe just enough to keep it free of some of the loose slough and biofilm on the wound surface. 05/11/18 on evaluation today patient's wound appears to be making slow but sure progress in regard to the left lateral lower extremity ulcer. He is been tolerating the dressing changes without complication. Fortunately there does not appear to be any evidence of infection at this time. He is still just using triple antibiotic ointment along with clobetasol occasionally over the area. He never got the Santyl and really does not seem to intend to in my pinion. 06/01/18 on evaluation today patient appears to be doing a little better in regard to his left lateral lower extremity ulcer. He states that overall he does not feel like he is doing as well with the Dapsone as he did with the prednisone. Nonetheless he sees his dermatologist later today and is gonna talk to them about the possibility of going back on the prednisone. Overall again I believe that the wound would be better if you would utilize Santyl but he really does not seem to be interested in going back to the [place] at this point. He has been using triple antibiotic ointment. 06/15/18 on evaluation today patient's wound actually appears to be doing about the same at this point. Fortunately there is no signs of infection at this time. He has made slight improvements although he continues to not really want to clean the wound bed at this point. He states that he just doesn't mess with it he doesn't want to cause any problems with everything else he has going on. He has been on medication, antibiotics as prescribed  by his dermatologist, for a staff infection of his lower extremities which is really drying out now and looking much better he tells me. Fortunately there is no sign of overall infection. 06/29/18 on evaluation today patient appears to be doing well in regard to his left lateral lower surety ulcer all things considering. Fortunately his staff infection seems to be greatly improved compared to previous. He has no signs of infection and this is drying up quite nicely. He is still the doxycycline for this is no longer on cental, Dapsone, or any of the other medications. His dermatologist has recommended possibility of an infusion but right now he does not want to proceed with that. 07/13/18 on evaluation today patient appears to be doing about the same in regard to his left lateral lower surety ulcer. Fortunately there's no signs of infection at this time which is great news. Unfortunately he still builds up a significant amount of Slough/biofilm of the surface of the wound he still is not really cleaning this as he should be appropriately. Again I'm able to easily with saline and gauze remove the majority of this on the surface which if you would do this at home would likely be a dramatic improvement for him as far as getting the area to improve. Nonetheless overall I still feel like he  is making progress is just very slow. I think Santyl will be of benefit for him as well. Still he has not gotten this as of this point. 07/27/18 on evaluation today patient actually appears to be doing little worse in regards of the erythema around the periwound region of the wound he also tells me that he's been having more drainage currently compared to what he was experiencing last time I saw him. He states not quite as bad as what he had because this was infected previously but nonetheless is still appears to be doing poorly. Fortunately there is no evidence of systemic infection at this point. The patient tells me that  he is not going to be able to afford the Santyl. He is still waiting to hear about the infusion therapy with his dermatologist. Apparently she wants an updated colonoscopy first. 08/10/18 on evaluation today patient appears to be doing better in regard to his left lateral lower extremity ulcer. Fortunately he is showing signs of improvement in this regard he's actually been approved for Remicade infusion's as well although this has not been scheduled as of yet. Fortunately there's no signs of active infection at this time in regard to the wound although he is having some issues with infection of the right lower extremity is been seen as dermatologist for this. Fortunately they are definitely still working with him trying to keep things under control. [name], [name] ([id number]) 09/07/18 on evaluation today patient is actually doing rather well in regard to his left lateral lower extremity ulcer. He notes these actually having some hair grow back on his extremity which is something he has not seen in years. He also tells me that the pain is really not giving them any trouble at this time which is also good news overall she is very pleased with the progress he's using a combination of the mupirocin along with the probate is all mixed. 09/21/18 on evaluation today patient actually appears to be doing fairly well all things considered in regard to his looks from the ulcer. He's been tolerating the dressing changes without complication. Fortunately there's no signs of active infection at this time which is good news he is still on all antibiotics or prevention of the staff infection. He has been on prednisone for time although he states it is gonna contact his dermatologist and see if she put them on a short course due to some irritation that he has going on currently. Fortunately there's no evidence of any overall worsening this is going very slow I think cental would be something that would be helpful for him  although he states that $50 for tube is quite expensive. He therefore is not willing to get that at this point. 10/06/18 on evaluation today patient actually appears to be doing decently well in regard to his left lateral leg ulcer. He's been tolerating the dressing changes without complication. Fortunately there's no signs of active infection at this time. Overall I'm actually rather pleased with the progress he's making although it's slow he doesn't show any signs of infection and he does seem to be making some improvement. I do believe that he may need a switch up and dressings to try to help this to heal more appropriately and quickly. 10/19/18 on evaluation today patient actually appears to be doing better in regard to his left lateral lower extremity ulcer. This is shown signs of having much less Slough buildup at this point due to the fact he has been using  the Santyl. Obviously this is very good news. The overall size of the wound is not dramatically smaller but again the appearance is. 11/02/18 on evaluation today patient actually appears to be doing quite well in regard to his lower [place] ulcer. A lot of the skin around the ulcer is actually somewhat irritating at this point this seems to be more due to the dressing causing irritation from the adhesive that anything else. Fortunately there is no signs of active infection at this time. 11/24/18 on evaluation today patient appears to be doing a little worse in regard to his overall appearance of his lower extremity ulcer. There's more erythema and warmth around the wound unfortunately. He is currently on doxycycline which he has been on for some time. With that being said I'm not sure that seems to be helping with what appears to possibly be an acute cellulitis with regard to his left lower extremity ulcer. No fevers, chills, nausea, or vomiting noted at this time. 12/08/18 on evaluation today patient's wounds actually appears to be doing  significantly better compared to his last evaluation. He has been using Santyl along with alternating tripling about appointment as well as the steroid cream seems to be doing quite well and the wound is showing signs of improvement which is excellent news. Fortunately there's no evidence of infection and in fact his culture came back negative with only normal skin flora noted. 12/21/2018 upon evaluation today patient actually appears to be doing excellent with regard to his ulcer. This is actually the best that I have seen it since have been helping to take care of him. It is both smaller as well as less slough noted on the surface of the wound and seems to be showing signs of good improvement with new skin growing from the edges. He has been using just the triamcinolone he does wonder if he can get a refill of that ointment today. 01/04/2019 upon evaluation today patient actually appears to be doing well with regard to his left lateral lower extremity ulcer. With that being said it does not appear to be that he is doing quite as well as last time as far as progression is concerned. There does not appear to be any signs of infection or significant irritation which is good news. With that being said I do believe that he may benefit from switching to a collagen based dressing based on how clean The wound appears. 01/18/2019 on evaluation today patient actually appears to be doing well with regard to his wound on the left lower extremity. He is not made a lot of progress compared to where we were previous but nonetheless does seem to be doing okay at this time which is good news. There is no signs of active infection which is also good news. My only concern currently is I do wish we can get him into utilizing the collagen dressing his insurance would not pay for the supplies that we ordered although it appears that he may be able to order this through his supply company that he typically utilizes. This is  [employer]. Nonetheless he did try to order it during the office visit today and it appears this did go through. We will see if he can get that it is a different brand but nonetheless he has collagen and I do think will be beneficial. 02/01/2019 on evaluation today patient actually appears to be doing a little worse today in regard to the overall size of his wounds. Fortunately there  is no signs of active infection at this time. That is visually. Nonetheless when this is happened before it was due to infection. For that reason were somewhat concerned about that this time as well. 02/08/2019 on evaluation today patient unfortunately appears to be doing slightly worse with regard to his wound upon evaluation today. Is measuring a little deeper and a little larger unfortunately. I am not really sure exactly what is causing this to enlarge he actually did see his dermatologist she is going to see about initiating Humira for him. Subsequently she also did do steroid injections into the wound itself in the periphery. Nonetheless still nonetheless he seems to be getting a little bit larger he is gone back to just using the steroid cream topically which I think is appropriate. I would say hold off on the collagen for the time being is definitely a good thing to do. Based on the culture results which we finally did get the final result back regarding it shows staph as the bacteria noted again that can be a normal skin bacteria based on the fact however he is having increased drainage and worsening of the wound measurement wise I would go ahead and place him on an antibiotic today I do believe for this. 02/15/2019 on evaluation today patient actually appears to be doing somewhat better in regard to his ulcer. There is no signs of worsening at this time I did review his culture results which showed evidence of Staphylococcus aureus but not MRSA. Again this could just be more related to the normal skin bacteria  although he states the drainage has slowed down quite a bit he may have had a mild infection not just colonization. And was much smaller and then since around10/04/2019 on evaluation today patient appears to be doing unfortunately worse as far as the size of the wound. I really feel like that this is steadily getting larger again it had been doing excellent right at the beginning of September we have seen a steady increase in the area of the wound it is almost 2-1/2 times the size it was on September 1. Obviously this is a bad trend this is not wanting to see. For that reason we went back to using just the topical triamcinolone cream which does seem to help with inflammation. I checked him for bacteria by way of culture and nothing showed positive there. I am considering giving him a short course of a tapering steroid Dosepak today to see if that is can be beneficial for him. The patient is in agreement with giving that a try. 03/08/2019 on evaluation today patient appears to be doing very well in comparison to last evaluation with regard to his lower extremity ulcer. This is showing signs of less inflammation and actually measuring slightly smaller compared to last time every other week over the past month and a half he has been measuring larger larger larger. Nonetheless I do believe that the issue has been inflammation the prednisone does seem to [name], [name] ([id number]) have been beneficial for him which is good news. No fevers, chills, nausea, vomiting, or diarrhea. 03/22/2019 on evaluation today patient appears to be doing about the same with regard to his leg ulcer. He has been tolerating the dressing changes without complication. With that being said the wound seems to be mostly arrested at its current size but really is not making any progress except for when we prescribed the prednisone. He did show some signs of dropping as far as  the overall size of the wound during that interval week.  Nonetheless this is something he is not on long-term at this point and unfortunately I think he is getting need either this or else the Humira which his dermatologist has discussed try to get approval for. With that being said he will be seeing his dermatologist on the 11th of this month that is November. 04/19/2019 on evaluation today patient appears to be doing really about the same the wound is measuring slightly larger compared to last time I saw him. He has not been into the office since November 2 due to the fact that he unfortunately had Covid as that his entire family. He tells me that it was rough but they did pull-through and he seems to be doing much better. Fortunately there is no signs of active infection at this time. No fevers, chills, nausea, vomiting, or diarrhea. 05/10/2019 on evaluation today patient unfortunately appears to be doing significantly worse as compared to last time I saw him. He does tell me that he has had his first dose of Humira and actually is scheduled to get the next one in the upcoming week. With that being said he tells me also that in the past several days he has been having a lot of issues with green drainage she showed me a picture this is more blue-green in color. He is also been having issues with increased sloughy buildup and the wound does appear to be larger today. Obviously this is not the direction that we want everything to take based on the starting of his Humira. Nonetheless I think this is definitely a result of likely infection and to be honest I think this is probably Pseudomonas causing the infection based on what I am seeing. 05/24/2019 on evaluation today patient unfortunately appears to be doing significantly worse compared to his prior evaluation with me 2 weeks ago. I did review his culture results which showed that he does have Staph aureus as well as Pseudomonas noted on the culture. Nonetheless the Levaquin that I prescribed for him does  not appear to have been appropriate and in fact he tells me he is no longer experiencing the green drainage and discharge that he had at the last visit. Fortunately there is no signs of active infection at this time which is good news although the wound has significantly worsened it in fact is much deeper than it was previous. We have been utilizing up to this point triamcinolone ointment as the prescription topical of choice but at this time I really feel like that the wound is getting need to be packed in order to appropriately manage this due to the deeper nature of the wound. Therefore something along the lines of an alginate dressing may be more appropriate. 05/31/2019 upon inspection today patient's wound actually showed signs of doing poorly at this point. Unfortunately he just does not seem to be making any good progress despite what we have tried. He actually did go ahead and pick up the Cipro and start taking that as he was noticing more green drainage he had previously completed the Levaquin that I prescribed for him as well. Nonetheless he missed his appointment for the seventh last week on Wednesday with the wound care center and [facility] where his dermatologist referred him. Obviously I do think a second opinion would be helpful at this point especially in light of the fact that the patient seems to be doing so poorly despite the fact  that we have tried everything that I really know how at this point. The only thing that ever seems to have helped him in the past is when he was on high doses of continual steroids that did seem to make a difference for him. 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 06/07/2019 upon evaluation today patient unfortunately appears to be doing worse yet again with regard to his wound. In fact I am  starting to question whether or not he may have a fluid pocket in the muscle at this point based on the bulging and the soft appearance to the central portion of the muscle area. There is not anything draining from the muscle itself at this time which is good news but nonetheless the wound is expanding. I am not really seeing any results of the Humira as far as overall wound progression based on what I am seeing at this point. The patient has been referred for second opinion with regard to his wound to the [facility] wound care center by his dermatologist which I definitely am not in opposition to. Unfortunately we tried multiple dressings in the past including collagen, alginate, and at one point even Hydrofera Blue. With that being said he is never really used it for any significant amount of time due to the fact that he often complains of pain associated with these dressings and then will go back to either using the Santyl which she has done intermittently or more frequently the triamcinolone. He is also using his own compression stockings. We have wrapped him in the past but again that was something else that he really was not a big fan of. 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 He seemed to do much better when he was on the steroids although honestly I understand that the steroids are not necessarily the best medication to be on long-term obviously 06/14/2019 on evaluation today patient appears to be doing actually a little bit better with regard to the overall appearance with his leg. Unfortunately he does continue to have issues with what appears to be some fluid underneath the  muscle although he did see the wound specialty center at [facility] last week their main goals were to see about infusion therapy in place of the Humira as they feel like that is not quite strong enough. They also recommended that we continue with the treatment otherwise as we are they felt like that was appropriate and they are okay with him continuing to follow-up here with [place] in that regard. With that being said they are also sending him to the vein specialist there to see about vein stripping and if that would be of benefit for him. Subsequently they also did not really address whether or not an ultrasound of the muscle area to see if there is anything that needs to be addressed here would be appropriate or not. For that reason I discussed this with him last week I think we  may proceed down that road at this point. 06/21/2019 upon evaluation today patient's wound actually appears to be doing slightly better compared to previous evaluations. I do believe that he has made a difference with regard to the progression here with the use of oral steroids. Again in the past has been the only thing that is really calm things down. He does tell me that from [facility] is gotten a good news from there that there are no further vein stripping that is necessary at this point. I do not have that available for review today although the patient did relay this to me. He also did obtain and have the ultrasound of the wound completed which I did sign off on today. It does appear that there is no fluid collection under the muscle this is likely then just edematous tissue in general. That is also good news. Overall I still believe the inflammation is the main issue here. He did inquire about the possibility of a wound VAC again with the muscle protruding like it is I am not really sure whether the wound VAC is necessarily ideal or not. That is something we will have to consider although I do believe he may need compression wrapping to  try to help with edema control which could potentially be of benefit. 06/28/2019 on evaluation today patient appears to be doing slightly better measurement wise although this is not terribly smaller he least seems to be trending towards that direction. With that being said he still seems to have purulent drainage noted in the wound bed at this time. He has been on Levaquin followed by Cipro over the past month. Unfortunately he still seems to have some issues with active infection at this time. I did perform a culture last week in order to evaluate and see if indeed there was still anything going on. Subsequently the culture did come back showing Pseudomonas which is consistent with the drainage has been having which is blue-green in color. He also has had an odor that again was somewhat consistent with Pseudomonas as well. Long story short it appears that the culture showed an intermediate finding with regard to how well the Cipro will work for the Pseudomonas infection. Subsequently being that he does not seem to be clearing up and at best what we are doing is just keeping this at [place] I think he may need to see infectious disease to discuss IV antibiotic options. LAJUAN, KOVALESKI ([id number]) 07/05/2019 upon evaluation today patient appears to be doing okay in regard to his leg ulcer. He has been tolerating the dressing changes at this point without complication. Fortunately there is no signs of active infection at this time which is good news. No fevers, chills, nausea, vomiting, or diarrhea. With that being said he does have an appointment with infectious disease tomorrow and his primary care on Wednesday. Again the reason for the infectious disease referral was due to the fact that he did not seem to be fully resolving with the use of oral antibiotics and therefore we were thinking that IV antibiotic therapy may be necessary secondary to the fact that there was an intermediate finding for  how effective the Cipro may be. Nonetheless again he has been having a lot of purulent and even green drainage. Fortunately right now that seems to have calmed down over the past week with the reinitiation of the oral antibiotic. Nonetheless we will see what Dr. Megan Salon has to say. 07/12/2019 upon evaluation today patient appears to be  doing about the same at this point in regard to his left lower extremity ulcer. Fortunately there is no signs of active infection at this time which is good news I do believe the Levaquin has been beneficial I did review Dr. Hale Bogus note and to be honest I agree that the patient's leg does appear to be doing better currently. What we found in the past as he does not seem to really completely resolve he will stop the antibiotic and then subsequently things will revert back to having issues with blue-green drainage, increased pain, and overall worsening in general. Obviously that is the reason I sent him back to infectious disease. 07/19/2019 upon evaluation today patient appears to be doing roughly the same in size there is really no dramatic improvement. He has started back on the Levaquin at this point and though he seems to be doing okay he did still have a lot of blue/green drainage noted on evaluation today unfortunately. I think that this is still indicative more likely of a Pseudomonas infection as previously noted and again he does see Dr. Megan Salon in just a couple of days. I do not know that were really able to effectively clear this with just oral antibiotics alone based on what I am seeing currently. Nonetheless we are still continue to try to manage as best we can with regard to the patient and his wound. I do think the wrap was helpful in decreasing the edema which is excellent news. No fevers, chills, nausea, vomiting, or diarrhea. 07/26/2019 upon evaluation today patient appears to be doing slightly better with regard to the overall appearance of the muscle  there is no dark discoloration centrally. Fortunately there is no signs of active infection at this time. No fevers, chills, nausea, vomiting, or diarrhea. Patient's wound bed currently the patient did have an appointment with Dr. Megan Salon at infectious disease last week. With that being said Dr. Megan Salon the patient states was still somewhat hesitant about put him on any IV antibiotics he wanted [place] to repeat cultures today and then see where things go going forward. He does look like Dr. Megan Salon because of some improvement the patient did have with the Levaquin wanted [place] to see about repeating cultures. If it indeed grows the Pseudomonas again then he recommended a possibility of considering a PICC line placement and IV antibiotic therapy. He plans to see the patient back in 1 to 2 weeks. 08/02/2019 upon evaluation today patient appears to be doing poorly with regard to his left lower extremity. We did get the results of his culture back it shows that he is still showing evidence of Pseudomonas which is consistent with the purulent/blue-green drainage that he has currently. Subsequently the culture also shows that he now is showing resistance to the oral fluoroquinolones which is unfortunate as that was really the only thing to treat the infection prior. I do believe that he is looking like this is going require IV antibiotic therapy to get this under control. Fortunately there is no signs of systemic infection at this time which is good news. The patient does see Dr. Megan Salon tomorrow. 08/09/2019 upon evaluation today patient appears to be doing better with regard to his left lower extremity ulcer in regard to the overall appearance. He is currently on IV antibiotic therapy. As ordered by Dr. Megan Salon. Currently the patient is on ceftazidime which she is going to take for the next 2 weeks and then follow-up for 4 to 5-week appointment with Dr. Megan Salon.  The patient started this this past Friday  symptoms have not for a total of 3 days currently in full. 08/16/2019 upon evaluation today patient's wound actually does show muscle in the base of the wound but in general does appear to be much better as far as the overall evidence of infection is concerned. In fact I feel like this is for the most part cleared up he still on the IV antibiotics he has not completed the full course yet but I think he is doing much better which is excellent news. 08/23/2019 upon evaluation today patient appears to be doing about the same with regard to his wound at this point. He tells me that he still has pain unfortunately. Fortunately there is no evidence of systemic infection at this time which is great news. There is significant muscle protrusion. 09/13/19 upon evaluation today patient appears to be doing about the same in regard to his leg unfortunately. He still has a lot of drainage coming from the ulceration there is still muscle exposed. With that being said the patient's last wound culture still showed an intermediate finding with regard to the Pseudomonas he still having the bluish/green drainage as well. Overall I do not know that the wound has completely cleared of infection at this point. Fortunately there is no signs of active infection systemically at this point which is good news. 09/20/2019 upon evaluation today patient's wound actually appears to be doing about the same based on what I am seeing currently. I do not see any signs of systemic infection he still does have evidence of some local infection and drainage. He did see Dr. Megan Salon last week and Dr. Megan Salon states that he probably does need a different IV antibiotic although he does not want to put him on this until the patient begins the Remicade infusion which is actually scheduled for about 10 days out from today on 13 May. Following that time Dr. Megan Salon is good to see him back and then will evaluate the feasibility of starting him on the  IV antibiotic therapy once again at that point. I do not disagree with this plan I do believe as Dr. Megan Salon stated in his note that I reviewed today that the patient's issue is multifactorial with the pyoderma being 1 aspect of this that were hoping the Remicade will be helpful for her. In the meantime I think the gentamicin is, helping to keep things under decent okay control in regard to the ulcer. 09/27/2019 upon evaluation today patient appears to be doing about the same with regard to his wound still there is a lot of muscle exposure though he does have some hyper granulation tissue noted around the edge and actually some granulation tissue starting to form over the muscle which is actually good news. Fortunately there is no evidence of active infection which is also good news. His pain is less at this point. 5/21; this is a patient I have not seen in a long time. 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 He has a large wound on the left lateral leg with protruding muscle. He comes in the clinic today showing the same area on his left medial ankle. He says there is been a spot there for some time although we have not previously defined this. Today he has a clearly defined area with slight amount of skin breakdown surrounded by raised areas with a purplish hue in color. This is not painful he says it is irritated.  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 He still has muscle protruding from the lateral portion of his left leg but fortunately the new area that they were concerned about at his last visit does not appear to have opened at this point. He is currently on Remicade infusions and seems to be doing better in my opinion in fact the wound itself seems to be overall much better. The purplish discoloration that he did have seems to have resolved  and I think that is a good sign that hopefully the Remicade is doing its job. He does have some biofilm noted over the surface of the wound. 11/01/2019 on evaluation today patient's wound actually appears to be doing excellent at this time. Fortunately there is no evidence of active infection and overall I feel like he is making great progress. The Remicade seems to be due excellent job in my opinion. [name], CHASON ([id number]) 11/08/19 evaluation today vision actually appears to be doing quite well with regard to his weight ulcer. He's been tolerating dressing changes without complication. Fortunately there is no evidence of infection. No fevers, chills, nausea, or vomiting noted at this time. Overall states that is having more itching than pain which is actually a good sign in my opinion. 12/13/2019 upon evaluation today patient appears to be doing well today with regard to his wound. He has been tolerating the dressing changes without complication. Fortunately there is no sign of active infection at this time. No fevers, chills, nausea, vomiting, or diarrhea. Overall I feel like the infusion therapy has been very beneficial for him. 01/06/2020 on evaluation today patient appears to be doing well with regard to his wound. This is measuring smaller and actually looks to be doing better. Fortunately there is no signs of active infection at this point. No fevers, chills, nausea, vomiting, or diarrhea. With that being said he does still have the blue-green drainage but this does not seem to be causing any significant issues currently. He has been using the gentamicin that does seem to be keeping things under decent control at this point. He goes later this morning for his next infusion therapy for the pyoderma which seems to also be very beneficial. 02/07/2020 on evaluation today patient appears to be doing about the same in regard to his wounds currently. Fortunately there is no signs of active infection  systemically he does still have evidence of local infection still using gentamicin. He also is showing some signs of improvement albeit slowly I do feel like we are making some progress here. 02/21/2020 upon evaluation today patient appears to be making some signs of improvement the wound is measuring a little bit smaller which is great news and overall I am very pleased with where he stands currently. He is going to be having infusion therapy treatment on the 15th of this month. Fortunately there is no signs of active infection at this time. 03/13/2020 I do believe patient's wound is actually showing some signs of improvement here which is great news. He has continue with the infusion therapy through rheumatology/dermatology at [facility]. That does seem to be beneficial. I still think he gets as much benefit from this as he did from the prednisone initially but nonetheless obviously this is less harsh on his body that the prednisone as far as they are concerned. 03/31/2020 on evaluation today patient's wound actually showing signs of some pretty good improvement in regard  to the overall appearance of the wound bed. There is still muscle exposed though he does have some epithelial growth around the edges of the wound. Fortunately there is no signs of active infection at this time. No fevers, chills, nausea, vomiting, or diarrhea. 04/24/2020 upon evaluation today patient appears to be doing about the same in regard to his leg ulcer. He has been tolerating the dressing changes without complication. Fortunately there is no signs of active infection at this time. No fevers, chills, nausea, vomiting, or diarrhea. With that being said he still has a lot of irritation from the bandaging around the edges of the wound. We did discuss today the possibility of a referral to plastic surgery. 05/22/2020 on evaluation today patient appears to be doing well with regard to his wounds all things considered. He has not been able  to get the Chantix apparently there is a recall nurse that I was unaware of put out by [employer] involuntarily. Nonetheless for now I am and I have to do some research into what may be the best option for him to help with quitting in regard to smoking and we discussed that today. 06/26/2020 upon evaluation today patient appears to be doing well with regard to his wound from the standpoint of infection I do not see any signs of infection at this point. With that being said unfortunately he is still continuing to have issues with muscle exposure and again he is not having a whole lot of new skin growth unfortunately. There does not appear to be any signs of active infection at this time. No fevers, chills, nausea, vomiting, or diarrhea. 07/10/2020 upon evaluation today patient appears to be doing a little bit more poorly currently compared to where he was previous. I am concerned currently about an active infection that may be getting worse especially in light of the increased size and tenderness of the wound bed. No fevers, chills, nausea, vomiting, or diarrhea. 07/24/2020 upon evaluation today patient appears to be doing poorly in regard to his leg ulcer. He has been tolerating the dressing changes without complication but unfortunately is having a lot of discomfort. 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 Subsequently I think he is going require possibly IV antibiotics to get this under control. I am very concerned about the severity of his infection and the amount of discomfort he is having. 07/31/2020 upon evaluation today patient appears to be doing about the same in regard to his leg wound. He did see Dr. Megan Salon and Dr. Megan Salon is actually going to start him on IV antibiotics. He goes for the PICC line tomorrow. With that being said there do not have that run for 2 weeks and then see how things are doing and depending on how he  is progressing they may extend that a little longer. Nonetheless I am glad this is getting ready to be in place and definitely feel it may help the patient. In the meantime is been using mainly triamcinolone to the wound bed has an anti-inflammatory. 08/07/2020 on evaluation today patient appears to be doing well with regard to his wound compared even last week. In the interim he has gotten the PICC line placed and overall this seems to be doing excellent. There does not appear to be any evidence of infection which is great news systemically although locally of course has had the infection this appears to be improving with the use of the antibiotics. 08/14/2020 upon  evaluation today patient's wound actually showing signs of excellent improvement. Overall the irritation has significantly improved the drainage is back down to more of a normal level and his pain is really pretty much nonexistent compared to what it was. Obviously I think that this is significantly improved secondary to the IV antibiotic therapy which has made all the difference in the world. Again he had a resistant form of Pseudomonas for which oral antibiotics just was not cutting it. Nonetheless I do think that still we need to consider the possibility of a surgical closure for this wound is been open so long and to be honest with muscle exposed I think this can be very hard to get this to close outside of this although definitely were still working to try to do what we can in that regard. 08/21/2020 upon evaluation today patient appears to be doing very well with regard to his wounds on the left lateral lower extremity/calf area. Fortunately there does not appear to be signs of active infection which is great news and overall very pleased with where things stand today. He is actually wrapping up his treatment with IV antibiotics tomorrow. After that we will see where things go from there. 08/28/2020 upon evaluation today patient appears  to be doing decently well with regard to his leg ulcer. There does not appear to be any signs of active infection which is great news and overall very pleased with where things stand today. No fevers, chills, nausea, vomiting, or diarrhea. 09/18/2020 upon evaluation today patient appears to be doing well with regard to his infection which I feel like is better. Unfortunately he is not doing as well with regard to the overall size of the wound which is not nearly as good at this point. I feel like that he may be having an issue here with the pyoderma being somewhat out of control. I think that he may benefit from potentially going back and talking to the dermatologist about [name], [name] ([id number]) what to do from the pyoderma standpoint. I am not certain if the infusions are helping nearly as much is what the prednisone did in the past. 10/02/2020 upon evaluation today patient appears to be doing well with regard to his leg ulcer. He did go to the [facility]. Unfortunately they feel like there is a 10% chance that most that he would be able to heal and that the skin graft would take. Obviously this has led him to not be able to go down that path as far as treatment is concerned. Nonetheless he does seem to be doing a little bit better with the prednisone that I gave him last time. I think that he may need to discuss with dermatology the possibility of long-term prednisone as that seems to be what is most helpful for him to be perfectly honest. I am not sure the Remicade is really doing the job. 10/17/2020 upon evaluation today patient appears to be doing a little better in regard to his wound. In fact the case has been since we did the prednisone on May 2 for him that we have noticed a little bit of improvement each time we have seen a size wise as well as appearance wise as well as pain wise. I think the prednisone has had a greater effect then the infusion therapy has to be perfectly honest. With  that being said the patient also feels significantly better compared to what he was previous. All of this is good news but  nonetheless I am still concerned about the fact that again we are really not set up to long-term manage him as far as prednisone is concerned. Obviously there are things that you need to be watched I completely understand the risk of prednisone usage as well. 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 With all that being said I do believe that we can give him another round of the prednisone which she is requesting today because of the improvement that he seen since we did that first round. 10/30/2020 upon evaluation today patient's wound actually is showing signs of doing quite well. There does not appear to be any evidence of infection which is great news and overall very pleased with where things stand today. No fevers, chills, nausea, vomiting, or diarrhea. He tells me that the prednisone still has seem to have helped he wonders if we can extend that for just a little bit longer. He did not have the appointment with a dermatologist although he did have an infusion appointment last Friday. That was at [facility]. With that being said he tells me he could not do both that as well as the appointment with the physician on the same day therefore that is can have to be rescheduled. I really want to see if there is anything they feel like that could be done differently to try to help this out as I am not really certain that the infusions are helping significantly here. 11/13/2020 upon evaluation today patient unfortunately appears to be doing somewhat poorly in regard to his wound I feel like this is actually worsening from the standpoint of the pyoderma spreading. I still feel like that he may need something different as far as trying to manage this going forward. Again we did the prednisone unfortunately his blood sugars are not doing so well  because of this. Nonetheless I believe that the patient likely needs to try topical steroid. We have done triamcinolone for a while I think going with something stronger such as clobetasol could be beneficial again this is not something I do lightly I discussed this with the patient that again this does not normally put underneath an occlusive dressing. Nonetheless I think a thin film as such could help with some of the stronger anti-inflammatory effects. We discussed this today. He would like to try to give this a trial for the next couple weeks. I definitely think that is something that we can do. Evaluate7/03/2021 and today patient's wound bed actually showed signs of doing really about the same. There was a little expansion of the size of the wound and that leading edge that we done looking out although the clobetasol does seem to have slowed this down a bit in my opinion. There is just 1 small area that still seems to be progressing based on what I see. Nonetheless I am concerned about the fact this does not seem to be improving if anything seems to be doing a little bit worse. I do not know that the infusions are really helping him much as next infusion is August 5 his appointment with dermatology is July 25. Either way I really think that we need to have a conversation potentially about this and I am actually going to see if I can talk with Dr. Lillia Carmel in order to see where things stand as well. 12/11/2020 upon evaluation today patient appears to be doing worse in regard to his leg ulcer. Unfortunately  I just do not think this is making the progress that I would like to see at this point. Honestly he does have an appointment with dermatology and this is in 2 days. I am wondering what they may have to offer to help with this. Right now what I am seeing is that he is continuing to show signs of worsening little by little. Obviously that is not great at all. Is the exact opposite of what we are  looking for. 12/18/2020 upon evaluation today patient appears to be doing a little better in regard to his wound. The dermatologist actually did do some steroid injections into the wound which does seem to have been beneficial in my opinion. That was on the 25th already this looks a little better to me than last time I saw him. With that being said we did do a culture and this did show that he has Staph aureus noted in abundance in the wound. With that being said I do think that getting him on an oral antibiotic would be appropriate as well. Also think we can compression wrap and this will make a difference as well. 12/28/2020 upon evaluation today patient's wound is actually showing signs of doing much better. I do believe the compression wrap is helping he has a lot of drainage but to be honest I think that the compression is helping to some degree in this regard as well as not draining through which is also good news. No fevers, chills, nausea, vomiting, or diarrhea. 01/04/2021 upon evaluation today patient appears to be doing well with regard to his wound. Overall things seem to be doing quite well. He did have a little bit of reaction to the CarboFlex Sorbact he will be using that any longer. With that being said he is controlled as far as the drainage is concerned overall and seems to be doing quite well. I do not see any signs of active infection at this time which is great news. No fevers, chills, nausea, vomiting, or diarrhea. 01/11/2021 upon evaluation today patient appears to be doing well with regard to his wounds. He has been tolerating the dressing changes without complication. Fortunately there does not appear to be any signs of active infection at this time which is great news. Overall I am extremely pleased with where we stand currently. No fevers, chills, nausea, vomiting, or diarrhea. Where using clobetasol in the wound bed he has a lot of new skin growth which is awesome as  well. 01/18/2021 upon evaluation today patient appears to be doing very well in regard to his leg ulcer. He has been tolerating the dressing changes without complication. Fortunately there does not appear to be any signs of active infection which is great news. In general I think that he is making excellent progress 01/25/2021 upon evaluation today patient appears to be doing well with regard to his wound on the leg. I am actually extremely pleased with where things stand today. There does not appear to be any signs of active infection which is great news and overall I think that we are definitely headed in the appropriate direction based on what I am seeing currently. There does not appear to be any signs of active infection also excellent news. 02/06/2021 upon evaluation today patient appears to be doing well with regard to his wound. Overall visually this is showing signs of significant improvement which is great news. I do not see any signs of active infection systemically which is great even locally  I do not think that we are seeing any major complications here. We did do fluorescence imaging with the [employer] today. The patient does have some odor and drainage noted and again this is something that I think would benefit him to probably come more frequently for nurse visits. 02/19/2021 upon evaluation today patient actually appears to be doing quite well in regard to his wound. He has been tolerating the dressing Deharo, Kortney ([id number]) changes without complication and overall I think that this is making excellent progress. I do not see any evidence of active infection at this point which is great news as well. No fevers, chills, nausea, vomiting, or diarrhea. 10/10; wound is made nice progress healthy granulation with a nice rim of epithelialization which seems to be expanding even from last week he has a deeper area in the inferior part of the more distal part of the wound with not quite as  healthy as surface. This area will need to be followed. Using clobetasol and Hydrofera Blue 03/05/2021 upon evaluation today patient appears to be doing very well in regard to his leg ulcer. He has been tolerating dressing changes without complication. Fortunately there does not appear to be any signs of active infection which is great news and overall I am extremely pleased with where we stand currently. 03/12/2021 upon evaluation today patient appears to be doing well with regard to his wound in fact this is extremely extremely good based on what we are seeing today there does not appear to be any signs of active infection and overall I think that he is doing awesome from the standpoint of healing in general. I am extremely pleased with how things seem to be progressing with regard to this pyoderma. Clobetasol has done wonders for him. I think the compression wrapping has also been of great benefit. 03/19/2021 upon evaluation today patient appears to be doing well with regard to his wound. He is tolerating the dressing changes without complication. In fact I feel like that he is actually making excellent progress at this point based on what I am seeing. No fevers, chills, nausea, vomiting, or diarrhea. 03/26/2021 upon evaluation today patient appears to be doing well with regard to his wound. This again is measuring smaller and looking better. Again the progress is slow but nonetheless continual with what we have been seeing. I do believe that the current plan is doing awesome for him. 04/09/2021 upon evaluation today patient appears to be doing well with regard to his leg ulcer. This is showing signs of excellent improvement the muscle is completely closed over and there does not appear to be any evidence of inflammation at this point his drainage is significantly improved. Overall I think that he would be a good candidate for looking into a skin substitute at this point as well. We will get a look  into some approvals in that regard. Potentially TheraSkin as well as Apligraf could both be considered just depending on insurance coverage. 04/16/2021 upon evaluation today patient appears to be doing well at this point. He has been approved for the Apligraf which we could definitely order although I would like to try to get the TheraSkin approved if at all possible. I did fax notes into them today and I Georgina Peer try to give a call as well. Overall the wound appears to be doing decently well today. 04/20/2021 upon evaluation today patient actually appears to be doing quite well in regard to his wound with that being said we  are trying to see what we can do about speeding up the healing process. For that reason we did discuss the possibility of a skin substitute. We got the Apligraf approved. For that reason we will get a go ahead and see what we can do with the Apligraf at this point. I am still trying to get the TheraSkin approved but I have not heard anything from the insurance company yet I have called and talked to them earlier in the week and to be honest this was about half an hour that I spent on the phone they told me I would hear something within 10-15 business days. 04/27/2021 upon evaluation today patient appears to be doing excellent in regard to his wound. I do believe the Apligraf has been beneficial. With that being said he has had a little bit of increased pain has been a little bit concerned. I do want to go ahead and apply his steroid cream today the clobetasol and then put the Apligraf over I just do not think I want to risk not putting the clobetasol on based on what is been going on here currently. Patient voiced understanding. 05/04/2021 upon evaluation today patient is making excellent improvement. Overall there is definitely decrease in the size of the wound today and I am very pleased in that regard. I do not see any signs of active infection locally nor systemically at this  point. No fevers, chills, nausea, vomiting, or diarrhea. 05/11/2021 upon evaluation today patient appears to be doing well with regard to his wound. He has been tolerating Apligraf's without complication and is making excellent progress this is application #4 today. 12/30; patient here for Apligraf application. Appears to be doing well small wound there has been a lot of healing 05/28/2021 upon evaluation today patient appears to be doing well with regard to his wound. Has been tolerating the dressing changes without complication. Fortunately I do not see any signs of active infection locally nor systemically at this point which is great news. He is done with the Apligraf and the first round and overall this has filled in quite nicely I am not even certain he needs any additional at the moment. I would actually try to go back to clobetasol with [facility] which previously was doing well for him. Electronic Signature(s) Signed: 05/28/2021 3:46:03 PM By: Worthy Keeler PA-C Entered By: Worthy Keeler on 05/28/2021 15:46:02 [name], [name] ([id number]) -------------------------------------------------------------------------------- Physical Exam Details Patient Name: [name] Date of Service: 05/28/2021 3:15 PM Medical Record Number: [id number] Patient Account Number: [id number] Date of Birth/Sex: Sep 08, 1980 (40 y.o. M) Treating RN: [name] Primary Care Provider: [name] Other Clinician: Referring Provider: [name] Treating Provider/Extender: [name] Cliche in Treatment: 70 Constitutional Obese and well-hydrated in no acute distress. Respiratory normal breathing without difficulty. Psychiatric this patient is able to make decisions and demonstrates good insight into disease process. Alert and Oriented x 3. pleasant and cooperative. Notes Upon inspection patient's wound bed showed signs of good granulation and epithelization at this point. Fortunately I see no  evidence of active infection and right now if anything I see some hypergranulation which is the main issue. I think the Hydrofera Blue could definitely help out with this. Electronic Signature(s) Signed: 05/28/2021 3:46:20 PM By: Worthy Keeler PA-C Entered By: Worthy Keeler on 05/28/2021 15:46:20 GILL, DELROSSI ([id number]) -------------------------------------------------------------------------------- Physician Orders Details Patient Name: [name] Date of Service: 05/28/2021 3:15 PM Medical Record Number: [id number] Patient Account Number: [id number] Date of  Birth/Sex: [birth year] (41 y.o. M) Treating RN: [name] Primary Care Provider: [name] Other Clinician: Referring Provider: [name] Treating Provider/Extender: [name] Cliche in Treatment: [phone] Verbal / Phone Orders: No Diagnosis Coding ICD-10 Coding Code Description I87.2 Venous insufficiency (chronic) (peripheral) L97.222 Non-pressure chronic ulcer of left calf with fat layer exposed E11.622 Type 2 diabetes mellitus with other skin ulcer L88 Pyoderma gangrenosum F17.208 Nicotine dependence, unspecified, with other nicotine-induced disorders Follow-up Appointments o Return Appointment in 1 week. o Nurse Visit as needed - twice a week Bathing/ Shower/ Hygiene o Clean wound with Normal Saline or wound cleanser. Edema Control - Lymphedema / Segmental Compressive Device / Other o Optional: One layer of unna paste to top of compression wrap (to act as an anchor). o Elevate, Exercise Daily and Avoid Standing for Long Periods of Time. o Elevate legs to the level of the heart and pump ankles as often as possible o Elevate leg(s) parallel to the floor when sitting. Wound Treatment Wound #1 - Lower Leg Wound Laterality: Left, Lateral Cleanser: Wound Cleanser 3 x Per Week/30 Days Discharge Instructions: Wash your hands with soap and water. Remove old dressing, discard into plastic bag and place  into trash. Cleanse the wound with Wound Cleanser prior to applying a clean dressing using gauze sponges, not tissues or cotton balls. Do not scrub or use excessive force. Pat dry using gauze sponges, not tissue or cotton balls. Peri-Wound Care: Moisturizing Lotion 3 x Per Week/30 Days Discharge Instructions: Suggestions: Theraderm, Eucerin, Cetaphil, or patient preference. Topical: Clobetasol Propionate ointment 0.05%, 60 (g) tube 3 x Per Week/30 Days Primary Dressing: Hydrofera Blue Ready Transfer Foam, 4x5 (in/in) 3 x Per Week/30 Days Discharge Instructions: Apply Hydrofera Blue Ready to wound bed as directed Secondary Dressing: Xtrasorb Medium 4x5 (in/in) 3 x Per Week/30 Days Discharge Instructions: Apply to wound as directed. Do not cut. Compression Wrap: Medichoice 4 layer Compression System, 35-40 mmHG (Generic) 3 x Per Week/30 Days Discharge Instructions: Apply multi-layer wrap as directed. Electronic Signature(s) Signed: 05/28/2021 3:48:07 PM By: Worthy Keeler PA-C Signed: 05/28/2021 4:38:47 PM By: [name] [name], RN, [name], [name], [name] Entered By: [name], [name], RN, [name], [name] on 05/28/2021 15:40:45 TYSHON, FANNING ([id number]) -------------------------------------------------------------------------------- Problem List Details Patient Name: [name] Date of Service: 05/28/2021 3:15 PM Medical Record Number: [id number] Patient Account Number: [id number] Date of Birth/Sex: August 21, 1980 (40 y.o. M) Treating RN: [name] Primary Care Provider: [name] Other Clinician: Referring Provider: [name] Treating Provider/Extender: [name] Cliche in Treatment: 233 Active Problems ICD-10 Encounter Code Description Active Date MDM Diagnosis I87.2 Venous insufficiency (chronic) (peripheral) 12/04/2016 No Yes L97.222 Non-pressure chronic ulcer of left calf with fat layer exposed 12/04/2016 No Yes E11.622 Type 2 diabetes mellitus with other skin ulcer 04/09/2021 No Yes L88 Pyoderma  gangrenosum 03/26/2017 No Yes F17.208 Nicotine dependence, unspecified, with other nicotine-induced disorders 04/24/2020 No Yes Inactive Problems ICD-10 Code Description Active Date Inactive Date L97.213 Non-pressure chronic ulcer of right calf with necrosis of muscle 04/02/2017 04/02/2017 Resolved Problems ICD-10 Code Description Active Date Resolved Date L97.321 Non-pressure chronic ulcer of left ankle limited to breakdown of skin 10/08/2019 10/08/2019 L03.116 Cellulitis of left lower limb 05/24/2019 05/24/2019 Electronic Signature(s) Signed: 05/28/2021 3:31:46 PM By: Worthy Keeler PA-C Entered By: Worthy Keeler on 05/28/2021 15:31:45 [name] ([id number]) -------------------------------------------------------------------------------- Progress Note Details Patient Name: [name] Date of Service: 05/28/2021 3:15 PM Medical Record Number: [id number] Patient Account Number: [id number] Date of Birth/Sex: Oct 10, 1980 (40 y.o. M) Treating RN:  [name] Primary Care Provider: [name] Other Clinician: Referring Provider: [name] Treating Provider/Extender: [name] Cliche in Treatment: 233 Subjective Chief Complaint Information obtained from Patient He is here in follow up evaluation for LLE pyoderma ulcer History of Present Illness (HPI) 12/04/16; 41 year old man who comes into the clinic today for review of a wound on the posterior left calf. He tells me that is been there for about a year. He is not a diabetic he does smoke half a pack per day. He was seen in the ER on 11/20/16 felt to have cellulitis around the wound and was given clindamycin. An x-ray did not show osteomyelitis. The patient initially tells me that he has a milk allergy that sets off a pruritic itching rash on his lower legs which she scratches incessantly and he thinks that's what may have set up the wound. He has been using various topical antibiotics and ointments without any effect. He works in a  [employer] and is on his feet all day. He does not have a prior history of wounds however he does have the rash on both lower legs the right arm and the ventral aspect of his left arm. These are excoriations and clearly have had scratching however there are of macular looking areas on both legs including a substantial larger area on the right leg. This does not have an underlying open area. There is no blistering. The patient tells me that 2 years ago in [place] in response to the rash on his legs he saw a dermatologist who told him he had a condition which may be pyoderma gangrenosum although I may be putting words into his mouth. He seemed to recognize this. On further questioning he admits to a 5 year history of quiesced. ulcerative colitis. He is not in any treatment for this. He's had no recent travel 12/11/16; the patient arrives today with his wound and roughly the same condition we've been using silver alginate this is a deep punched out wound with some surrounding erythema but no tenderness. Biopsy I did did not show confirmed pyoderma gangrenosum suggested nonspecific inflammation and vasculitis but does not provide an actual description of what was seen by the pathologist. I'm really not able to understand this We have also received information from the patient's dermatologist in [place] notes from April 2016. This was a doctor Agarwal-antal. 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 He was prescribed topical steroid high potency under occlusion which helped but at this point the patient did not have a deep punched out wound. 12/18/16; the patient's wound is larger in terms of surface area however this surface looks better and there is less depth. The surrounding erythema also is better. The patient states that the wrap we put on came off 2 days ago when he has been using his compression stockings. He we are in the process of getting a dermatology consult. 12/26/16 on evaluation  today patient's left lower extremity wound shows evidence of infection with surrounding erythema noted. He has been tolerating the dressing changes but states that he has noted more discomfort. There is a larger area of erythema surrounding the wound. No fevers, chills, nausea, or vomiting noted at this time. With that being said the wound still does have slough covering the surface. He is not allergic to any medication that he is aware of at this point. In regard to his right lower extremity he had several regions that are erythematous and pruritic he wonders if  there's anything we can do to help that. 01/02/17 I reviewed patient's wound culture which was obtained his visit last week. He was placed on doxycycline at that point. Unfortunately that does not appear to be an antibiotic that would likely help with the situation however the pseudomonas noted on culture is sensitive to Cipro. Also unfortunately patient's wound seems to have a large compared to last week's evaluation. Not severely so but there are definitely increased measurements in general. He is continuing to have discomfort as well he writes this to be a seven out of 10. In fact he would prefer me not to perform any debridement today due to the fact that he is having discomfort and considering he has an active infection on the little reluctant to do so anyway. No fevers, chills, nausea, or vomiting noted at this time. 01/08/17; patient seems dermatology on September 5. I suspect dermatology will want the slides from the biopsy I did sent to their pathologist. I'm not sure if there is a way we can expedite that. In any case the culture I did before I left on vacation 3 weeks ago showed Pseudomonas he was given 10 days of Cipro and per her description of her intake nurses is actually somewhat better this week although the wound is quite a bit bigger than I remember the last time I saw this. He still has 3 more days of Cipro 01/21/17;  dermatology appointment tomorrow. He has completed the ciprofloxacin for Pseudomonas. Surface of the wound looks better however he is had some deterioration in the lesions on his right leg. Meantime the left lateral leg wound we will continue with sample 01/29/17; patient had his dermatology appointment but I can't yet see that note. He is completed his antibiotics. The wound is more superficial but considerably larger in circumferential area than when he came in. This is in his left lateral calf. He also has swollen erythematous areas with superficial wounds on the right leg and small papular areas on both arms. There apparently areas in her his upper thighs and buttocks I did not look at those. Dermatology biopsied the right leg. Hopefully will have their input next week. 02/05/17; patient went back to see his dermatologist who told him that he had a "scratching problem" as well as staph. He is now on a 30 day course of doxycycline and I believe she gave him triamcinolone cream to the right leg areas to help with the itching [not exactly sure but probably triamcinolone]. She apparently looked at the left lateral leg wound although this was not rebiopsied and I think felt to be ultimately part of the same pathogenesis. He is using sample border foam and changing nevus himself. He now has a new open area on the right posterior leg which was his biopsy site I don't have any of the dermatology notes 02/12/17; we put the patient in compression last week with SANTYL to the wound on the left leg and the biopsy. Edema is much better and the depth of the wound is now at level of skin. Area is still the same Biopsy site on the right lateral leg we've also been using santyl with a border foam dressing and he is changing this himself. 02/19/17; Using silver alginate started last week to both the substantial left leg wound and the biopsy site on the right wound. He is tolerating compression well. Has a an  appointment with his primary M.D. tomorrow wondering about diuretics although I'm wondering if the edema  problem is actually lymphedema [name], [name] ([id number]) 02/26/17; the patient has been to see his primary doctor Dr. Jerrel Ivory at [place] our primary care. She started him on Lasix 20 mg and this seems to have helped with the edema. However we are not making substantial change with the left lateral calf wound and inflammation. The biopsy site on the right leg also looks stable but not really all that different. 03/12/17; the patient has been to see vein and vascular Dr. Lucky Cowboy. He has had venous reflux studies I have not reviewed these. I did get a call from his dermatology office. 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 They therefore recommended clobetasol and tetracycline i.e. aggressive treatment for possible pyoderma gangrenosum. 03/26/17; apparently the patient just had reflux studies not an appointment with Dr. dew. She arrives in clinic today having applied clobetasol for 2-3 weeks. He notes over the last 2-3 days excessive drainage having to change the dressing 3-4 times a day and also expanding erythema. 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 Lillia Carmel at [facility] dermatology locally who kindly saw him at my request. 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 My concern was the degree of erythema medially  extending from his left leg wound which was either inflammation from pyoderma or cellulitis. I put him on Augmentin however culture of the wound showed Pseudomonas which is quinolone sensitive. I really don't believe he has cellulitis however in view of everything I will continue and give him a course of Cipro. He is also on doxycycline as an immune modulator for the pyoderma. In addition to his original wound on the left lateral leg with surrounding erythema he has a wound on the right posterior calf which was an original biopsy site done by dermatology. 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 Saw Dr. Lillia Carmel yesterday. He has been using topical antibiotics to both wound areas his original wound on the left and the biopsies/pathergy area on the right. There is definitely some improvement in the inflammation around the wound on the right although the patient states he has increasing sensitivity of the wounds. He is on prednisone 60 and doxycycline 1 as prescribed by Dr. Lillia Carmel. He is covering the topical antibiotic with gauze and putting this in his own compression stocks and changing this daily. He states that Dr. Lottie Rater did a culture of the left leg wound yesterday 05/07/17; pyoderma gangrenosum. The patient saw Dr. Lillia Carmel yesterday and has a follow-up with her in one month. He is still using topical antibiotics to both wounds although he can't recall exactly what type. He is still on prednisone 60 mg. Dr. Lillia Carmel stated that the doxycycline could stop if we were in agreement. 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 The right medial leg was induced by  biopsy/pathergy. The area on the right is essentially healed. Still on high-dose prednisone using topical antibiotics to the wound 07/09/17; pyoderma gangrenosum with wounds on  the left lateral leg. The right medial leg has closed and remains closed. He is still on prednisone 60. He tells me he missed his last dermatology appointment with Dr. Lillia Carmel but will make another appointment. He reports that her blood sugar at a recent screen in [place] was high 200's. He was 180 today. 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 I think he is going to require still much longer prednisone perhaps another 3 months before attempting to taper. In the meantime his wound is a lot better. Smaller. He is cleaning this off daily and applying topical antibiotics. When he was last in the clinic I thought about changing to [facility] and actually put in a couple of calls to dermatology although probably not during their business hours. 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 He continues on oral prednisone. He has been using triple antibiotic ointment. There is surface debris and we will transition to [facility] and have him return in 2 weeks. He has lost 30 pounds since his last appointment with lifestyle modification. He may benefit from topical steroid cream for treatment this can be considered at a later date. 08/22/17 on evaluation today patient appears to actually be doing rather well in regard to his left lateral lower extremity ulcer. He has actually been managed by Dr. Dellia Nims most recently. Patient is currently on oral steroids at this time. This seems to have been of benefit for him. Nonetheless his last visit was actually with Leah on 08/06/17. Currently he is not utilizing any topical steroid creams although this could be of benefit as well. No fevers, chills, nausea, or vomiting noted at this time. 09/05/17 on evaluation today patient appears to be doing better in regard to his left lateral lower extremity ulcer. He has been tolerating the dressing  changes without complication. He is using Santyl with good effect. Overall I'm very pleased with how things are standing at this point. Patient likewise is happy that this is doing better. 09/19/17 on evaluation today patient actually appears to be doing rather well in regard to his left lateral lower extremity ulcer. 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 He's not having any significant pain. 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 He has been tolerating the Santyl without complication and in general I feel like he's making good progress. 10/17/17 on evaluation today patient appears to be doing very well in regard to his left lateral lower surety ulcer. He has been tolerating the dressing changes without complication. There does not appear to be any evidence of infection he's alternating the Santyl and the triple antibiotic ointment every other day this seems to be doing well for him. 11/03/17 on evaluation today patient appears to be doing very well in regard to his left lateral lower extremity ulcer. He is been tolerating the dressing changes without complication which is good news. Fortunately there does not appear to be any evidence of infection which is also great news. Overall is doing excellent they are starting to  taper down on the prednisone is down to 40 mg at this point it also started topical clobetasol for him. 11/17/17 on evaluation today patient appears to be doing well in regard to his left lateral lower surety ulcer. He's been tolerating the dressing changes without complication. He does note that he is having no pain, no excessive drainage or discharge, and overall he feels like things are going about how he would expect and hope they would. Overall he seems to have no evidence of infection at this  time in my opinion which is good news. 12/04/17-He is seen in follow-up evaluation for right lateral lower extremity ulcer. He has been applying topical steroid cream. Today's measurement show slight increase in size. Over the next 2 weeks we will transition to every other day Santyl and steroid cream. He has been encouraged to monitor for changes and notify clinic with any concerns 12/15/17 on evaluation today patient's left lateral motion the ulcer and fortunately is doing worse again at this point. This just since last week to this week has close to doubled in size according to the patient. I did not seeing last week's I do not have a visual to compare this to in our system was also down so we do not have all the charts and at this point. Nonetheless it does have me somewhat concerned in regard to the fact that again he was worried enough about it he has contact the dermatology that placed them back on the full strength, 50 mg a day of the prednisone that he was taken previous. He continues to alternate using clobetasol along with Santyl at this point. He is obviously somewhat frustrated. [name], [name] ([id number]) 12/22/17 on evaluation today patient appears to be doing a little worse compared to last evaluation. Unfortunately the wound is a little deeper and slightly larger than the last week's evaluation. With that being said he has made some progress in regard to the irritation surrounding at this time unfortunately despite that progress that's been made he still has a significant issue going on here. 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 For that reason I discussed with him today the possibility of perform a wound culture to ensure there's nothing overtly infected. 01/06/18 on evaluation today patient's wound is larger and deeper than  previously evaluated. With that being said it did appear that his wound was infected after my last evaluation with him. Subsequently I did end up prescribing a prescription for Bactrim DS which she has been taking and having no complication with. Fortunately there does not appear to be any evidence of infection at this point in time as far as anything spreading, no want to touch, and overall I feel like things are showing signs of improvement. 01/13/18 on evaluation today patient appears to be even a little larger and deeper than last time. There still muscle exposed in the base of the wound. Nonetheless he does appear to be less erythematous I do believe inflammation is calming down also believe the infection looks like it's probably resolved at this time based on what I'm seeing. No fevers, chills, nausea, or vomiting noted at this time. 01/30/18 on evaluation today patient actually appears to visually look better for the most part. Unfortunately those visually this looks better he does seem to potentially have what may be an abscess in the muscle that  has been noted in the central portion of the wound. This is the first time that I have noted what appears to be fluctuance in the central portion of the muscle. With that being said I'm somewhat more concerned about the fact that this might indicate an abscess formation at this location. I do believe that an ultrasound would be appropriate. This is likely something we need to try to do as soon as possible. He has been switch to mupirocin ointment and he is no longer using the steroid ointment as prescribed by dermatology he sees them again next week he's been decreased from 60 to 40 mg of prednisone. 03/09/18 on evaluation today patient actually appears to be doing a little better compared to last time I saw him. There's not as much erythema surrounding the wound itself. He I did review his most recent infectious disease note which was dated 02/24/18. He  saw Dr. Michel Bickers in [place]. 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 Patient is now off of antibiotics and they are continually observing this. There seems to be no change in the past two weeks in my pinion based on what the patient says and what I see today compared to what Dr. Megan Salon likely saw two weeks ago. No fevers, chills, nausea, or vomiting noted at this time. 03/23/18 on evaluation today patient's wound actually appears to be showing signs of improvement which is good news. He is currently still on the Dapsone. He is also working on tapering the prednisone to get off of this and Dr. Lottie Rater is working with him in this regard. Nonetheless overall I feel like the wound is doing well it does appear based on the infectious disease note that I reviewed from Dr. 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 I did also review the results of his ultrasound of left lower extremity which revealed there was a dentist tissue in the base of the wound without an abscess noted. 04/06/18 on evaluation today the patient's left lateral lower extremity ulcer actually appears to be doing fairly well which is excellent news. There does not appear to be any evidence of infection at this time which is also great news. Overall he still does have a significantly large ulceration although little by little he seems to be making progress. He is down to 10 mg a day of the prednisone. 04/20/18 on evaluation today patient actually appears to be doing excellent at this time in regard to his left lower extremity ulcer. He's making signs of good progress unfortunately this is taking much longer than we would really like to see but nonetheless he is making progress. Fortunately there does not appear to be any  evidence of infection at this time. No fevers, chills, nausea, or vomiting noted at this time. The patient has not been using the Santyl due to the cost he hadn't got in this field yet. He's mainly been using the antibiotic ointment topically. Subsequently he also tells me that he really has not been scrubbing in the shower I think this would be helpful again as I told him it doesn't have to be anything too aggressive to even make it believe just enough to keep it free of some of the loose slough and biofilm on the wound surface.  05/11/18 on evaluation today patient's wound appears to be making slow but sure progress in regard to the left lateral lower extremity ulcer. He is been tolerating the dressing changes without complication. Fortunately there does not appear to be any evidence of infection at this time. He is still just using triple antibiotic ointment along with clobetasol occasionally over the area. He never got the Santyl and really does not seem to intend to in my pinion. 06/01/18 on evaluation today patient appears to be doing a little better in regard to his left lateral lower extremity ulcer. He states that overall he does not feel like he is doing as well with the Dapsone as he did with the prednisone. Nonetheless he sees his dermatologist later today and is gonna talk to them about the possibility of going back on the prednisone. Overall again I believe that the wound would be better if you would utilize Santyl but he really does not seem to be interested in going back to the [place] at this point. He has been using triple antibiotic ointment. 06/15/18 on evaluation today patient's wound actually appears to be doing about the same at this point. Fortunately there is no signs of infection at this time. He has made slight improvements although he continues to not really want to clean the wound bed at this point. He states that he just doesn't mess with it he doesn't want to cause any  problems with everything else he has going on. He has been on medication, antibiotics as prescribed by his dermatologist, for a staff infection of his lower extremities which is really drying out now and looking much better he tells me. Fortunately there is no sign of overall infection. 06/29/18 on evaluation today patient appears to be doing well in regard to his left lateral lower surety ulcer all things considering. Fortunately his staff infection seems to be greatly improved compared to previous. He has no signs of infection and this is drying up quite nicely. He is still the doxycycline for this is no longer on cental, Dapsone, or any of the other medications. His dermatologist has recommended possibility of an infusion but right now he does not want to proceed with that. 07/13/18 on evaluation today patient appears to be doing about the same in regard to his left lateral lower surety ulcer. Fortunately there's no signs of infection at this time which is great news. Unfortunately he still builds up a significant amount of Slough/biofilm of the surface of the wound he still is not really cleaning this as he should be appropriately. Again I'm able to easily with saline and gauze remove the majority of this on the surface which if you would do this at home would likely be a dramatic improvement for him as far as getting the area to improve. Nonetheless overall I still feel like he is making progress is just very slow. I think Santyl will be of benefit for him as well. Still he has not gotten this as of this point. 07/27/18 on evaluation today patient actually appears to be doing little worse in regards of the erythema around the periwound region of the wound he also tells me that he's been having more drainage currently compared to what he was experiencing last time I saw him. He states not quite as bad as what he had because this was infected previously but nonetheless is still appears to be doing  poorly. Fortunately there is no evidence of systemic infection at this point. The  patient tells me that he is not going to be able to afford the Santyl. He is still waiting to hear about the infusion therapy with his dermatologist. Apparently she wants an updated colonoscopy first. [name], [name] ([id number]) 08/10/18 on evaluation today patient appears to be doing better in regard to his left lateral lower extremity ulcer. Fortunately he is showing signs of improvement in this regard he's actually been approved for Remicade infusion's as well although this has not been scheduled as of yet. Fortunately there's no signs of active infection at this time in regard to the wound although he is having some issues with infection of the right lower extremity is been seen as dermatologist for this. Fortunately they are definitely still working with him trying to keep things under control. 09/07/18 on evaluation today patient is actually doing rather well in regard to his left lateral lower extremity ulcer. He notes these actually having some hair grow back on his extremity which is something he has not seen in years. He also tells me that the pain is really not giving them any trouble at this time which is also good news overall she is very pleased with the progress he's using a combination of the mupirocin along with the probate is all mixed. 09/21/18 on evaluation today patient actually appears to be doing fairly well all things considered in regard to his looks from the ulcer. He's been tolerating the dressing changes without complication. Fortunately there's no signs of active infection at this time which is good news he is still on all antibiotics or prevention of the staff infection. He has been on prednisone for time although he states it is gonna contact his dermatologist and see if she put them on a short course due to some irritation that he has going on currently. Fortunately there's no evidence of any  overall worsening this is going very slow I think cental would be something that would be helpful for him although he states that $50 for tube is quite expensive. He therefore is not willing to get that at this point. 10/06/18 on evaluation today patient actually appears to be doing decently well in regard to his left lateral leg ulcer. He's been tolerating the dressing changes without complication. Fortunately there's no signs of active infection at this time. Overall I'm actually rather pleased with the progress he's making although it's slow he doesn't show any signs of infection and he does seem to be making some improvement. I do believe that he may need a switch up and dressings to try to help this to heal more appropriately and quickly. 10/19/18 on evaluation today patient actually appears to be doing better in regard to his left lateral lower extremity ulcer. This is shown signs of having much less Slough buildup at this point due to the fact he has been using the [employer]. Obviously this is very good news. The overall size of the wound is not dramatically smaller but again the appearance is. 11/02/18 on evaluation today patient actually appears to be doing quite well in regard to his lower [place] ulcer. A lot of the skin around the ulcer is actually somewhat irritating at this point this seems to be more due to the dressing causing irritation from the adhesive that anything else. Fortunately there is no signs of active infection at this time. 11/24/18 on evaluation today patient appears to be doing a little worse in regard to his overall appearance of his lower extremity ulcer. There's more erythema  and warmth around the wound unfortunately. He is currently on doxycycline which he has been on for some time. With that being said I'm not sure that seems to be helping with what appears to possibly be an acute cellulitis with regard to his left lower extremity ulcer. No fevers, chills, nausea, or  vomiting noted at this time. 12/08/18 on evaluation today patient's wounds actually appears to be doing significantly better compared to his last evaluation. He has been using Santyl along with alternating tripling about appointment as well as the steroid cream seems to be doing quite well and the wound is showing signs of improvement which is excellent news. Fortunately there's no evidence of infection and in fact his culture came back negative with only normal skin flora noted. 12/21/2018 upon evaluation today patient actually appears to be doing excellent with regard to his ulcer. This is actually the best that I have seen it since have been helping to take care of him. It is both smaller as well as less slough noted on the surface of the wound and seems to be showing signs of good improvement with new skin growing from the edges. He has been using just the triamcinolone he does wonder if he can get a refill of that ointment today. 01/04/2019 upon evaluation today patient actually appears to be doing well with regard to his left lateral lower extremity ulcer. With that being said it does not appear to be that he is doing quite as well as last time as far as progression is concerned. There does not appear to be any signs of infection or significant irritation which is good news. With that being said I do believe that he may benefit from switching to a collagen based dressing based on how clean The wound appears. 01/18/2019 on evaluation today patient actually appears to be doing well with regard to his wound on the left lower extremity. He is not made a lot of progress compared to where we were previous but nonetheless does seem to be doing okay at this time which is good news. There is no signs of active infection which is also good news. My only concern currently is I do wish we can get him into utilizing the collagen dressing his insurance would not pay for the supplies that we ordered although it  appears that he may be able to order this through his supply company that he typically utilizes. This is [employer]. Nonetheless he did try to order it during the office visit today and it appears this did go through. We will see if he can get that it is a different brand but nonetheless he has collagen and I do think will be beneficial. 02/01/2019 on evaluation today patient actually appears to be doing a little worse today in regard to the overall size of his wounds. Fortunately there is no signs of active infection at this time. That is visually. Nonetheless when this is happened before it was due to infection. For that reason were somewhat concerned about that this time as well. 02/08/2019 on evaluation today patient unfortunately appears to be doing slightly worse with regard to his wound upon evaluation today. Is measuring a little deeper and a little larger unfortunately. I am not really sure exactly what is causing this to enlarge he actually did see his dermatologist she is going to see about initiating Humira for him. Subsequently she also did do steroid injections into the wound itself in the periphery. Nonetheless still nonetheless he  seems to be getting a little bit larger he is gone back to just using the steroid cream topically which I think is appropriate. I would say hold off on the collagen for the time being is definitely a good thing to do. Based on the culture results which we finally did get the final result back regarding it shows staph as the bacteria noted again that can be a normal skin bacteria based on the fact however he is having increased drainage and worsening of the wound measurement wise I would go ahead and place him on an antibiotic today I do believe for this. 02/15/2019 on evaluation today patient actually appears to be doing somewhat better in regard to his ulcer. There is no signs of worsening at this time I did review his culture results which showed evidence of  Staphylococcus aureus but not MRSA. Again this could just be more related to the normal skin bacteria although he states the drainage has slowed down quite a bit he may have had a mild infection not just colonization. And was much smaller and then since around10/04/2019 on evaluation today patient appears to be doing unfortunately worse as far as the size of the wound. I really feel like that this is steadily getting larger again it had been doing excellent right at the beginning of September we have seen a steady increase in the area of the wound it is almost 2-1/2 times the size it was on September 1. Obviously this is a bad trend this is not wanting to see. For that reason we went back to using just the topical triamcinolone cream which does seem to help with inflammation. I checked him for bacteria by way of culture and nothing showed positive there. I am considering giving him a short course of a tapering steroid Burr Soffer, Negan ([id number]) today to see if that is can be beneficial for him. The patient is in agreement with giving that a try. 03/08/2019 on evaluation today patient appears to be doing very well in comparison to last evaluation with regard to his lower extremity ulcer. This is showing signs of less inflammation and actually measuring slightly smaller compared to last time every other week over the past month and a half he has been measuring larger larger larger. Nonetheless I do believe that the issue has been inflammation the prednisone does seem to have been beneficial for him which is good news. No fevers, chills, nausea, vomiting, or diarrhea. 03/22/2019 on evaluation today patient appears to be doing about the same with regard to his leg ulcer. He has been tolerating the dressing changes without complication. With that being said the wound seems to be mostly arrested at its current size but really is not making any progress except for when we prescribed the prednisone. He  did show some signs of dropping as far as the overall size of the wound during that interval week. Nonetheless this is something he is not on long-term at this point and unfortunately I think he is getting need either this or else the Humira which his dermatologist has discussed try to get approval for. With that being said he will be seeing his dermatologist on the 11th of this month that is November. 04/19/2019 on evaluation today patient appears to be doing really about the same the wound is measuring slightly larger compared to last time I saw him. He has not been into the office since November 2 due to the fact that he unfortunately had Covid  as that his entire family. He tells me that it was rough but they did pull-through and he seems to be doing much better. Fortunately there is no signs of active infection at this time. No fevers, chills, nausea, vomiting, or diarrhea. 05/10/2019 on evaluation today patient unfortunately appears to be doing significantly worse as compared to last time I saw him. He does tell me that he has had his first dose of Humira and actually is scheduled to get the next one in the upcoming week. With that being said he tells me also that in the past several days he has been having a lot of issues with green drainage she showed me a picture this is more blue-green in color. He is also been having issues with increased sloughy buildup and the wound does appear to be larger today. Obviously this is not the direction that we want everything to take based on the starting of his Humira. Nonetheless I think this is definitely a result of likely infection and to be honest I think this is probably Pseudomonas causing the infection based on what I am seeing. 05/24/2019 on evaluation today patient unfortunately appears to be doing significantly worse compared to his prior evaluation with me 2 weeks ago. I did review his culture results which showed that he does have Staph aureus as  well as Pseudomonas noted on the culture. Nonetheless the Levaquin that I prescribed for him does not appear to have been appropriate and in fact he tells me he is no longer experiencing the green drainage and discharge that he had at the last visit. Fortunately there is no signs of active infection at this time which is good news although the wound has significantly worsened it in fact is much deeper than it was previous. We have been utilizing up to this point triamcinolone ointment as the prescription topical of choice but at this time I really feel like that the wound is getting need to be packed in order to appropriately manage this due to the deeper nature of the wound. Therefore something along the lines of an alginate dressing may be more appropriate. 05/31/2019 upon inspection today patient's wound actually showed signs of doing poorly at this point. Unfortunately he just does not seem to be making any good progress despite what we have tried. He actually did go ahead and pick up the Cipro and start taking that as he was noticing more green drainage he had previously completed the Levaquin that I prescribed for him as well. Nonetheless he missed his appointment for the seventh last week on Wednesday with the wound care center and [facility] where his dermatologist referred him. Obviously I do think a second opinion would be helpful at this point especially in light of the fact that the patient seems to be doing so poorly despite the fact that we have tried everything that I really know how at this point. The only thing that ever seems to have helped him in the past is when he was on high doses of continual steroids that did seem to make a difference for him. 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 06/07/2019 upon evaluation today  patient unfortunately appears to be doing worse yet again with regard to his wound. In fact I am starting to question  whether or not he may have a fluid pocket in the muscle at this point based on the bulging and the soft appearance to the central portion of the muscle area. There is not anything draining from the muscle itself at this time which is good news but nonetheless the wound is expanding. I am not really seeing any results of the Humira as far as overall wound progression based on what I am seeing at this point. The patient has been referred for second opinion with regard to his wound to the [facility] wound care center by his dermatologist which I definitely am not in opposition to. Unfortunately we tried multiple dressings in the past including collagen, alginate, and at one point even Hydrofera Blue. With that being said he is never really used it for any significant amount of time due to the fact that he often complains of pain associated with these dressings and then will go back to either using the Santyl which she has done intermittently or more frequently the triamcinolone. He is also using his own compression stockings. We have wrapped him in the past but again that was something else that he really was not a big fan of. 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 He seemed to do much better when he was on the steroids although honestly I understand that the steroids are not necessarily the best medication to be on long-term obviously 06/14/2019 on evaluation today patient appears to be doing actually a little bit better with regard to the overall appearance with his  leg. Unfortunately he does continue to have issues with what appears to be some fluid underneath the muscle although he did see the wound specialty center at [facility] last week their main goals were to see about infusion therapy in place of the Humira as they feel like that is not quite strong enough. They also recommended that we continue with the treatment otherwise as we are they felt like that was appropriate and they are okay with him continuing to follow-up here with [place] in that regard. With that being said they are also sending him to the vein specialist there to see about vein stripping and if that would be of benefit for him. Subsequently they also did not really address whether or not an ultrasound of the muscle area to see if there is anything that needs to be addressed here would be appropriate or not. For that reason I discussed this with him last week I think we may proceed down that road at this point. 06/21/2019 upon evaluation today patient's wound actually appears to be doing slightly better compared to previous evaluations. I do believe that he has made a difference with regard to the progression here with the use of oral steroids. Again in the past has been the only thing that is really calm things down. He does tell me that from [facility] is gotten a good news from there that there are no further vein stripping that is necessary at this point. I do not have that available for review today although the patient did relay this to me. He also did obtain and have  the ultrasound of the wound completed which I did sign off on today. It does appear that there is no fluid collection under the muscle this is likely then just edematous tissue in general. That is also good news. Overall I still believe the inflammation is the main issue here. He did inquire about the possibility of a wound VAC again with the muscle protruding like it is I am not really sure whether the wound VAC is necessarily ideal or not.  That is something we will have to consider although I do believe he may need compression wrapping to try to help with edema control which could potentially be of benefit. 06/28/2019 on evaluation today patient appears to be doing slightly better measurement wise although this is not terribly smaller he least seems to be trending towards that direction. With that being said he still seems to have purulent drainage noted in the wound bed at this time. He has been on Levaquin followed by Cipro over the past month. Unfortunately he still seems to have some issues with active infection at this time. I did perform a culture last week in order to evaluate and see if indeed there was still anything going on. Subsequently the culture did come back Halperin, Harlow ([id number]) showing Pseudomonas which is consistent with the drainage has been having which is blue-green in color. He also has had an odor that again was somewhat consistent with Pseudomonas as well. Long story short it appears that the culture showed an intermediate finding with regard to how well the Cipro will work for the Pseudomonas infection. Subsequently being that he does not seem to be clearing up and at best what we are doing is just keeping this at [place] I think he may need to see infectious disease to discuss IV antibiotic options. 07/05/2019 upon evaluation today patient appears to be doing okay in regard to his leg ulcer. He has been tolerating the dressing changes at this point without complication. Fortunately there is no signs of active infection at this time which is good news. No fevers, chills, nausea, vomiting, or diarrhea. With that being said he does have an appointment with infectious disease tomorrow and his primary care on Wednesday. Again the reason for the infectious disease referral was due to the fact that he did not seem to be fully resolving with the use of oral antibiotics and therefore we were thinking that IV antibiotic  therapy may be necessary secondary to the fact that there was an intermediate finding for how effective the Cipro may be. Nonetheless again he has been having a lot of purulent and even green drainage. Fortunately right now that seems to have calmed down over the past week with the reinitiation of the oral antibiotic. Nonetheless we will see what Dr. Megan Salon has to say. 07/12/2019 upon evaluation today patient appears to be doing about the same at this point in regard to his left lower extremity ulcer. Fortunately there is no signs of active infection at this time which is good news I do believe the Levaquin has been beneficial I did review Dr. Hale Bogus note and to be honest I agree that the patient's leg does appear to be doing better currently. What we found in the past as he does not seem to really completely resolve he will stop the antibiotic and then subsequently things will revert back to having issues with blue-green drainage, increased pain, and overall worsening in general. Obviously that is the reason I sent him back to  infectious disease. 07/19/2019 upon evaluation today patient appears to be doing roughly the same in size there is really no dramatic improvement. He has started back on the Levaquin at this point and though he seems to be doing okay he did still have a lot of blue/green drainage noted on evaluation today unfortunately. I think that this is still indicative more likely of a Pseudomonas infection as previously noted and again he does see Dr. Megan Salon in just a couple of days. I do not know that were really able to effectively clear this with just oral antibiotics alone based on what I am seeing currently. Nonetheless we are still continue to try to manage as best we can with regard to the patient and his wound. I do think the wrap was helpful in decreasing the edema which is excellent news. No fevers, chills, nausea, vomiting, or diarrhea. 07/26/2019 upon evaluation today  patient appears to be doing slightly better with regard to the overall appearance of the muscle there is no dark discoloration centrally. Fortunately there is no signs of active infection at this time. No fevers, chills, nausea, vomiting, or diarrhea. Patient's wound bed currently the patient did have an appointment with Dr. Megan Salon at infectious disease last week. With that being said Dr. Megan Salon the patient states was still somewhat hesitant about put him on any IV antibiotics he wanted [place] to repeat cultures today and then see where things go going forward. He does look like Dr. Megan Salon because of some improvement the patient did have with the Levaquin wanted [place] to see about repeating cultures. If it indeed grows the Pseudomonas again then he recommended a possibility of considering a PICC line placement and IV antibiotic therapy. He plans to see the patient back in 1 to 2 weeks. 08/02/2019 upon evaluation today patient appears to be doing poorly with regard to his left lower extremity. We did get the results of his culture back it shows that he is still showing evidence of Pseudomonas which is consistent with the purulent/blue-green drainage that he has currently. Subsequently the culture also shows that he now is showing resistance to the oral fluoroquinolones which is unfortunate as that was really the only thing to treat the infection prior. I do believe that he is looking like this is going require IV antibiotic therapy to get this under control. Fortunately there is no signs of systemic infection at this time which is good news. The patient does see Dr. Megan Salon tomorrow. 08/09/2019 upon evaluation today patient appears to be doing better with regard to his left lower extremity ulcer in regard to the overall appearance. He is currently on IV antibiotic therapy. As ordered by Dr. Megan Salon. Currently the patient is on ceftazidime which she is going to take for the next 2 weeks and then follow-up  for 4 to 5-week appointment with Dr. Megan Salon. The patient started this this past Friday symptoms have not for a total of 3 days currently in full. 08/16/2019 upon evaluation today patient's wound actually does show muscle in the base of the wound but in general does appear to be much better as far as the overall evidence of infection is concerned. In fact I feel like this is for the most part cleared up he still on the IV antibiotics he has not completed the full course yet but I think he is doing much better which is excellent news. 08/23/2019 upon evaluation today patient appears to be doing about the same with regard to his wound  at this point. He tells me that he still has pain unfortunately. Fortunately there is no evidence of systemic infection at this time which is great news. There is significant muscle protrusion. 09/13/19 upon evaluation today patient appears to be doing about the same in regard to his leg unfortunately. He still has a lot of drainage coming from the ulceration there is still muscle exposed. With that being said the patient's last wound culture still showed an intermediate finding with regard to the Pseudomonas he still having the bluish/green drainage as well. Overall I do not know that the wound has completely cleared of infection at this point. Fortunately there is no signs of active infection systemically at this point which is good news. 09/20/2019 upon evaluation today patient's wound actually appears to be doing about the same based on what I am seeing currently. I do not see any signs of systemic infection he still does have evidence of some local infection and drainage. He did see Dr. Megan Salon last week and Dr. Megan Salon states that he probably does need a different IV antibiotic although he does not want to put him on this until the patient begins the Remicade infusion which is actually scheduled for about 10 days out from today on 13 May. Following that time Dr. Megan Salon  is good to see him back and then will evaluate the feasibility of starting him on the IV antibiotic therapy once again at that point. I do not disagree with this plan I do believe as Dr. Megan Salon stated in his note that I reviewed today that the patient's issue is multifactorial with the pyoderma being 1 aspect of this that were hoping the Remicade will be helpful for her. In the meantime I think the gentamicin is, helping to keep things under decent okay control in regard to the ulcer. 09/27/2019 upon evaluation today patient appears to be doing about the same with regard to his wound still there is a lot of muscle exposure though he does have some hyper granulation tissue noted around the edge and actually some granulation tissue starting to form over the muscle which is actually good news. Fortunately there is no evidence of active infection which is also good news. His pain is less at this point. 5/21; this is a patient I have not seen in a long time. 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 He has a large wound on the left lateral leg with protruding muscle. He comes in the clinic today showing the same area on his left medial ankle. He says there is been a spot there for some time although we have not previously defined this. Today he has a clearly defined area with slight amount of skin breakdown surrounded by raised areas with a purplish hue in color. This is not painful he says it is irritated. 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 He still has muscle protruding from the lateral portion of his left leg but fortunately the new area that they were concerned about at his last visit does not appear to have opened at this point. He is currently on Remicade infusions and seems to be doing better in my opinion in fact the wound itself seems to be  overall much better. The purplish discoloration that he did have seems to have resolved and I think that is a good sign that hopefully the Remicade is doing its  job. He does Flom, Andrea ([id number]) have some biofilm noted over the surface of the wound. 11/01/2019 on evaluation today patient's wound actually appears to be doing excellent at this time. Fortunately there is no evidence of active infection and overall I feel like he is making great progress. The Remicade seems to be due excellent job in my opinion. 11/08/19 evaluation today vision actually appears to be doing quite well with regard to his weight ulcer. He's been tolerating dressing changes without complication. Fortunately there is no evidence of infection. No fevers, chills, nausea, or vomiting noted at this time. Overall states that is having more itching than pain which is actually a good sign in my opinion. 12/13/2019 upon evaluation today patient appears to be doing well today with regard to his wound. He has been tolerating the dressing changes without complication. Fortunately there is no sign of active infection at this time. No fevers, chills, nausea, vomiting, or diarrhea. Overall I feel like the infusion therapy has been very beneficial for him. 01/06/2020 on evaluation today patient appears to be doing well with regard to his wound. This is measuring smaller and actually looks to be doing better. Fortunately there is no signs of active infection at this point. No fevers, chills, nausea, vomiting, or diarrhea. With that being said he does still have the blue-green drainage but this does not seem to be causing any significant issues currently. He has been using the gentamicin that does seem to be keeping things under decent control at this point. He goes later this morning for his next infusion therapy for the pyoderma which seems to also be very beneficial. 02/07/2020 on evaluation today patient appears to be doing about the  same in regard to his wounds currently. Fortunately there is no signs of active infection systemically he does still have evidence of local infection still using gentamicin. He also is showing some signs of improvement albeit slowly I do feel like we are making some progress here. 02/21/2020 upon evaluation today patient appears to be making some signs of improvement the wound is measuring a little bit smaller which is great news and overall I am very pleased with where he stands currently. He is going to be having infusion therapy treatment on the 15th of this month. Fortunately there is no signs of active infection at this time. 03/13/2020 I do believe patient's wound is actually showing some signs of improvement here which is great news. He has continue with the infusion therapy through rheumatology/dermatology at [facility]. That does seem to be beneficial. I still think he gets as much benefit from this as he did from the prednisone initially but nonetheless obviously this is less harsh on his body that the prednisone as far as they are concerned. 03/31/2020 on evaluation today patient's wound actually showing signs of some pretty good improvement in regard to the overall appearance of the wound bed. There is still muscle exposed though he does have some epithelial growth around the edges of the wound. Fortunately there is no signs of active infection at this time. No fevers, chills, nausea, vomiting, or diarrhea. 04/24/2020 upon evaluation today patient appears to be doing about the same in regard to his leg ulcer. He has been tolerating the dressing changes without complication. Fortunately there is no signs of active infection at this time. No fevers, chills, nausea, vomiting, or diarrhea. With that being said he still has a lot of irritation from the bandaging around the edges of the wound. We did  discuss today the possibility of a referral to plastic surgery. 05/22/2020 on evaluation today patient  appears to be doing well with regard to his wounds all things considered. He has not been able to get the Chantix apparently there is a recall nurse that I was unaware of put out by [employer] involuntarily. Nonetheless for now I am and I have to do some research into what may be the best option for him to help with quitting in regard to smoking and we discussed that today. 06/26/2020 upon evaluation today patient appears to be doing well with regard to his wound from the standpoint of infection I do not see any signs of infection at this point. With that being said unfortunately he is still continuing to have issues with muscle exposure and again he is not having a whole lot of new skin growth unfortunately. There does not appear to be any signs of active infection at this time. No fevers, chills, nausea, vomiting, or diarrhea. 07/10/2020 upon evaluation today patient appears to be doing a little bit more poorly currently compared to where he was previous. I am concerned currently about an active infection that may be getting worse especially in light of the increased size and tenderness of the wound bed. No fevers, chills, nausea, vomiting, or diarrhea. 07/24/2020 upon evaluation today patient appears to be doing poorly in regard to his leg ulcer. He has been tolerating the dressing changes without complication but unfortunately is having a lot of discomfort. 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 Subsequently I think he is going require possibly IV antibiotics to get this under control. I am very concerned about the severity of his infection and the amount of discomfort he is having. 07/31/2020 upon evaluation today patient appears to be doing about the same in regard to his leg wound. He did see Dr. Megan Salon and Dr. Megan Salon is actually going to start him on IV antibiotics. He goes for the PICC line tomorrow. With that being said  there do not have that run for 2 weeks and then see how things are doing and depending on how he is progressing they may extend that a little longer. Nonetheless I am glad this is getting ready to be in place and definitely feel it may help the patient. In the meantime is been using mainly triamcinolone to the wound bed has an anti-inflammatory. 08/07/2020 on evaluation today patient appears to be doing well with regard to his wound compared even last week. In the interim he has gotten the PICC line placed and overall this seems to be doing excellent. There does not appear to be any evidence of infection which is great news systemically although locally of course has had the infection this appears to be improving with the use of the antibiotics. 08/14/2020 upon evaluation today patient's wound actually showing signs of excellent improvement. Overall the irritation has significantly improved the drainage is back down to more of a normal level and his pain is really pretty much nonexistent compared to what it was. Obviously I think that this is significantly improved secondary to the IV antibiotic therapy which has made all the difference in the world. Again he had a resistant form of Pseudomonas for which oral antibiotics just was not cutting it. Nonetheless I do think that still we need to consider the possibility of a surgical closure for this wound is been open so long and to be honest with muscle exposed  I think this can be very hard to get this to close outside of this although definitely were still working to try to do what we can in that regard. 08/21/2020 upon evaluation today patient appears to be doing very well with regard to his wounds on the left lateral lower extremity/calf area. Fortunately there does not appear to be signs of active infection which is great news and overall very pleased with where things stand today. He is actually wrapping up his treatment with IV antibiotics tomorrow.  After that we will see where things go from there. 08/28/2020 upon evaluation today patient appears to be doing decently well with regard to his leg ulcer. There does not appear to be any signs of [name], [name] ([id number]) active infection which is great news and overall very pleased with where things stand today. No fevers, chills, nausea, vomiting, or diarrhea. 09/18/2020 upon evaluation today patient appears to be doing well with regard to his infection which I feel like is better. Unfortunately he is not doing as well with regard to the overall size of the wound which is not nearly as good at this point. I feel like that he may be having an issue here with the pyoderma being somewhat out of control. I think that he may benefit from potentially going back and talking to the dermatologist about what to do from the pyoderma standpoint. I am not certain if the infusions are helping nearly as much is what the prednisone did in the past. 10/02/2020 upon evaluation today patient appears to be doing well with regard to his leg ulcer. He did go to the Psychiatric nurse. Unfortunately they feel like there is a 10% chance that most that he would be able to heal and that the skin graft would take. Obviously this has led him to not be able to go down that path as far as treatment is concerned. Nonetheless he does seem to be doing a little bit better with the prednisone that I gave him last time. I think that he may need to discuss with dermatology the possibility of long-term prednisone as that seems to be what is most helpful for him to be perfectly honest. I am not sure the Remicade is really doing the job. 10/17/2020 upon evaluation today patient appears to be doing a little better in regard to his wound. In fact the case has been since we did the prednisone on May 2 for him that we have noticed a little bit of improvement each time we have seen a size wise as well as appearance wise as well as pain wise. I think  the prednisone has had a greater effect then the infusion therapy has to be perfectly honest. With that being said the patient also feels significantly better compared to what he was previous. All of this is good news but nonetheless I am still concerned about the fact that again we are really not set up to long-term manage him as far as prednisone is concerned. Obviously there are things that you need to be watched I completely understand the risk of prednisone usage as well. 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 With all that being said I do believe that we can give him another round of the prednisone which she is requesting today because of the improvement that he seen since we did that first round. 10/30/2020 upon evaluation today patient's wound actually is showing signs of doing  quite well. There does not appear to be any evidence of infection which is great news and overall very pleased with where things stand today. No fevers, chills, nausea, vomiting, or diarrhea. He tells me that the prednisone still has seem to have helped he wonders if we can extend that for just a little bit longer. He did not have the appointment with a dermatologist although he did have an infusion appointment last Friday. That was at [facility]. With that being said he tells me he could not do both that as well as the appointment with the physician on the same day therefore that is can have to be rescheduled. I really want to see if there is anything they feel like that could be done differently to try to help this out as I am not really certain that the infusions are helping significantly here. 11/13/2020 upon evaluation today patient unfortunately appears to be doing somewhat poorly in regard to his wound I feel like this is actually worsening from the standpoint of the pyoderma spreading. I still feel like that he may need something different as far as trying to manage  this going forward. Again we did the prednisone unfortunately his blood sugars are not doing so well because of this. Nonetheless I believe that the patient likely needs to try topical steroid. We have done triamcinolone for a while I think going with something stronger such as clobetasol could be beneficial again this is not something I do lightly I discussed this with the patient that again this does not normally put underneath an occlusive dressing. Nonetheless I think a thin film as such could help with some of the stronger anti-inflammatory effects. We discussed this today. He would like to try to give this a trial for the next couple weeks. I definitely think that is something that we can do. Evaluate7/03/2021 and today patient's wound bed actually showed signs of doing really about the same. There was a little expansion of the size of the wound and that leading edge that we done looking out although the clobetasol does seem to have slowed this down a bit in my opinion. There is just 1 small area that still seems to be progressing based on what I see. Nonetheless I am concerned about the fact this does not seem to be improving if anything seems to be doing a little bit worse. I do not know that the infusions are really helping him much as next infusion is August 5 his appointment with dermatology is July 25. Either way I really think that we need to have a conversation potentially about this and I am actually going to see if I can talk with Dr. Lillia Carmel in order to see where things stand as well. 12/11/2020 upon evaluation today patient appears to be doing worse in regard to his leg ulcer. Unfortunately I just do not think this is making the progress that I would like to see at this point. Honestly he does have an appointment with dermatology and this is in 2 days. I am wondering what they may have to offer to help with this. Right now what I am seeing is that he is continuing to show signs of  worsening little by little. Obviously that is not great at all. Is the exact opposite of what we are looking for. 12/18/2020 upon evaluation today patient appears to be doing a little better in regard to his wound. The dermatologist actually did do some steroid injections into the wound  which does seem to have been beneficial in my opinion. That was on the 25th already this looks a little better to me than last time I saw him. With that being said we did do a culture and this did show that he has Staph aureus noted in abundance in the wound. With that being said I do think that getting him on an oral antibiotic would be appropriate as well. Also think we can compression wrap and this will make a difference as well. 12/28/2020 upon evaluation today patient's wound is actually showing signs of doing much better. I do believe the compression wrap is helping he has a lot of drainage but to be honest I think that the compression is helping to some degree in this regard as well as not draining through which is also good news. No fevers, chills, nausea, vomiting, or diarrhea. 01/04/2021 upon evaluation today patient appears to be doing well with regard to his wound. Overall things seem to be doing quite well. He did have a little bit of reaction to the CarboFlex Sorbact he will be using that any longer. With that being said he is controlled as far as the drainage is concerned overall and seems to be doing quite well. I do not see any signs of active infection at this time which is great news. No fevers, chills, nausea, vomiting, or diarrhea. 01/11/2021 upon evaluation today patient appears to be doing well with regard to his wounds. He has been tolerating the dressing changes without complication. Fortunately there does not appear to be any signs of active infection at this time which is great news. Overall I am extremely pleased with where we stand currently. No fevers, chills, nausea, vomiting, or diarrhea.  Where using clobetasol in the wound bed he has a lot of new skin growth which is awesome as well. 01/18/2021 upon evaluation today patient appears to be doing very well in regard to his leg ulcer. He has been tolerating the dressing changes without complication. Fortunately there does not appear to be any signs of active infection which is great news. In general I think that he is making excellent progress 01/25/2021 upon evaluation today patient appears to be doing well with regard to his wound on the leg. I am actually extremely pleased with where things stand today. There does not appear to be any signs of active infection which is great news and overall I think that we are definitely headed in the appropriate direction based on what I am seeing currently. There does not appear to be any signs of active infection also excellent news. 02/06/2021 upon evaluation today patient appears to be doing well with regard to his wound. Overall visually this is showing signs of significant Suitt, Joushua ([id number]) improvement which is great news. I do not see any signs of active infection systemically which is great even locally I do not think that we are seeing any major complications here. We did do fluorescence imaging with the [employer] today. The patient does have some odor and drainage noted and again this is something that I think would benefit him to probably come more frequently for nurse visits. 02/19/2021 upon evaluation today patient actually appears to be doing quite well in regard to his wound. He has been tolerating the dressing changes without complication and overall I think that this is making excellent progress. I do not see any evidence of active infection at this point which is great news as well. No fevers, chills, nausea,  vomiting, or diarrhea. 10/10; wound is made nice progress healthy granulation with a nice rim of epithelialization which seems to be expanding even from last week  he has a deeper area in the inferior part of the more distal part of the wound with not quite as healthy as surface. This area will need to be followed. Using clobetasol and Hydrofera Blue 03/05/2021 upon evaluation today patient appears to be doing very well in regard to his leg ulcer. He has been tolerating dressing changes without complication. Fortunately there does not appear to be any signs of active infection which is great news and overall I am extremely pleased with where we stand currently. 03/12/2021 upon evaluation today patient appears to be doing well with regard to his wound in fact this is extremely extremely good based on what we are seeing today there does not appear to be any signs of active infection and overall I think that he is doing awesome from the standpoint of healing in general. I am extremely pleased with how things seem to be progressing with regard to this pyoderma. Clobetasol has done wonders for him. I think the compression wrapping has also been of great benefit. 03/19/2021 upon evaluation today patient appears to be doing well with regard to his wound. He is tolerating the dressing changes without complication. In fact I feel like that he is actually making excellent progress at this point based on what I am seeing. No fevers, chills, nausea, vomiting, or diarrhea. 03/26/2021 upon evaluation today patient appears to be doing well with regard to his wound. This again is measuring smaller and looking better. Again the progress is slow but nonetheless continual with what we have been seeing. I do believe that the current plan is doing awesome for him. 04/09/2021 upon evaluation today patient appears to be doing well with regard to his leg ulcer. This is showing signs of excellent improvement the muscle is completely closed over and there does not appear to be any evidence of inflammation at this point his drainage is significantly improved. Overall I think that he  would be a good candidate for looking into a skin substitute at this point as well. We will get a look into some approvals in that regard. Potentially TheraSkin as well as Apligraf could both be considered just depending on insurance coverage. 04/16/2021 upon evaluation today patient appears to be doing well at this point. He has been approved for the Apligraf which we could definitely order although I would like to try to get the TheraSkin approved if at all possible. I did fax notes into them today and I Georgina Peer try to give a call as well. Overall the wound appears to be doing decently well today. 04/20/2021 upon evaluation today patient actually appears to be doing quite well in regard to his wound with that being said we are trying to see what we can do about speeding up the healing process. For that reason we did discuss the possibility of a skin substitute. We got the Apligraf approved. For that reason we will get a go ahead and see what we can do with the Apligraf at this point. I am still trying to get the TheraSkin approved but I have not heard anything from the insurance company yet I have called and talked to them earlier in the week and to be honest this was about half an hour that I spent on the phone they told me I would hear something within 10-15 business days. 04/27/2021  upon evaluation today patient appears to be doing excellent in regard to his wound. I do believe the Apligraf has been beneficial. With that being said he has had a little bit of increased pain has been a little bit concerned. I do want to go ahead and apply his steroid cream today the clobetasol and then put the Apligraf over I just do not think I want to risk not putting the clobetasol on based on what is been going on here currently. Patient voiced understanding. 05/04/2021 upon evaluation today patient is making excellent improvement. Overall there is definitely decrease in the size of the wound today and I am very  pleased in that regard. I do not see any signs of active infection locally nor systemically at this point. No fevers, chills, nausea, vomiting, or diarrhea. 05/11/2021 upon evaluation today patient appears to be doing well with regard to his wound. He has been tolerating Apligraf's without complication and is making excellent progress this is application #4 today. 12/30; patient here for Apligraf application. Appears to be doing well small wound there has been a lot of healing 05/28/2021 upon evaluation today patient appears to be doing well with regard to his wound. Has been tolerating the dressing changes without complication. Fortunately I do not see any signs of active infection locally nor systemically at this point which is great news. He is done with the Apligraf and the first round and overall this has filled in quite nicely I am not even certain he needs any additional at the moment. I would actually try to go back to clobetasol with [facility] which previously was doing well for him. Objective Constitutional Obese and well-hydrated in no acute distress. Vitals Time Taken: 3:20 PM, Height: 71 in, Weight: 338 lbs, BMI: 47.1, Temperature: 98.4 F, Pulse: 111 bpm, Respiratory Rate: 18 breaths/min, Blood Pressure: 139/78 mmHg. Troxler, Jedediah ([id number]) Respiratory normal breathing without difficulty. Psychiatric this patient is able to make decisions and demonstrates good insight into disease process. Alert and Oriented x 3. pleasant and cooperative. General Notes: Upon inspection patient's wound bed showed signs of good granulation and epithelization at this point. Fortunately I see no evidence of active infection and right now if anything I see some hypergranulation which is the main issue. I think the Hydrofera Blue could definitely help out with this. Integumentary (Hair, Skin) Wound #1 status is Open. Original cause of wound was Gradually Appeared. The date acquired was: 11/18/2015.  The wound has been in treatment 233 weeks. The wound is located on the Left,Lateral Lower Leg. The wound measures 2.5cm length x 2cm width x 0.1cm depth; 3.927cm^2 area and 0.393cm^3 volume. There is Fat Layer (Subcutaneous Tissue) exposed. There is no tunneling or undermining noted. There is a medium amount of serosanguineous drainage noted. The wound margin is flat and intact. There is medium (34-66%) red, hyper - granulation within the wound bed. There is a medium (34-66%) amount of necrotic tissue within the wound bed including Adherent Slough. 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 Nurse Visit as needed - twice a week Bathing/ Shower/ Hygiene: Clean wound with Normal Saline or wound cleanser. Edema Control - Lymphedema / Segmental Compressive Device / Other: Optional: One layer of unna paste to top of compression wrap (to act as an anchor).  Elevate, Exercise Daily and Avoid Standing for Long Periods of Time. Elevate legs to the level of the heart and pump ankles as often as possible Elevate leg(s) parallel to the floor when sitting. WOUND #1: - Lower Leg Wound Laterality: Left, Lateral Cleanser: Wound Cleanser 3 x Per Week/30 Days Discharge Instructions: Wash your hands with soap and water. Remove old dressing, discard into plastic bag and place into trash. Cleanse the wound with Wound Cleanser prior to applying a clean dressing using gauze sponges, not tissues or cotton balls. Do not scrub or use excessive force. Pat dry using gauze sponges, not tissue or cotton balls. Peri-Wound Care: Moisturizing Lotion 3 x Per Week/30 Days Discharge Instructions: Suggestions: Theraderm, Eucerin,  Cetaphil, or patient preference. Topical: Clobetasol Propionate ointment 0.05%, 60 (g) tube 3 x Per Week/30 Days Primary Dressing: Hydrofera Blue Ready Transfer Foam, 4x5 (in/in) 3 x Per Week/30 Days Discharge Instructions: Apply Hydrofera Blue Ready to wound bed as directed Secondary Dressing: Xtrasorb Medium 4x5 (in/in) 3 x Per Week/30 Days Discharge Instructions: Apply to wound as directed. Do not cut. Compression Wrap: Medichoice 4 layer Compression System, 35-40 mmHG (Generic) 3 x Per Week/30 Days Discharge Instructions: Apply multi-layer wrap as directed. 1. Would recommend currently that we go ahead and continue with the close monitoring of this patient. I will plan to see him back next week for reevaluation to see where things stand. [place] suggest as well that we go ahead and switch back to the clobetasol followed by the [facility] which in the past has done well for him. 3. I am also can recommend that we continue with the XtraSorb followed by the 4-layer compression wrap which has done extremely well as well. 4. If indeed it looks like we need to see about getting approval for another round of the Apligraf I will do that at that point but right now I am hopeful that this combination of the clobetasol with the [facility] may finish things off for [place] and get him completely healed. LEANDREW, KEECH ([id number]) We will see patient back for reevaluation in 1 week here in the clinic. If anything worsens or changes patient will contact our office for additional recommendations. Electronic Signature(s) Signed: 05/28/2021 3:47:20 PM By: Worthy Keeler PA-C Entered By: Worthy Keeler on 05/28/2021 15:47:20 Jester, Hendry ([id number]) -------------------------------------------------------------------------------- SuperBill Details Patient Name: [name] Date of Service: 05/28/2021 Medical Record Number: [id number] Patient Account Number: [id number] Date of Birth/Sex: [birth year] (40  y.o. M) Treating RN: [name] Primary Care Provider: [name] Other Clinician: Referring Provider: [name] Treating Provider/Extender: [name] Cliche in Treatment: 233 Diagnosis Coding ICD-10 Codes Code Description I87.2 Venous insufficiency (chronic) (peripheral) L97.222 Non-pressure chronic ulcer of left calf with fat layer exposed E11.622 Type 2 diabetes mellitus with other skin ulcer L88 Pyoderma gangrenosum F17.208 Nicotine dependence, unspecified, with other nicotine-induced disorders Facility Procedures CPT4 Code: 24825003 Description: (Facility Use Only) [phone] - [facility] LWR LT LEG Modifier: Quantity: 1 Physician Procedures CPT4 Code: 1694503 Description: 88828 - WC PHYS LEVEL 4 - EST PT Modifier: Quantity: 1 CPT4 Code: Description: ICD-10 Diagnosis Description I87.2 Venous insufficiency (chronic) (peripheral) L97.222 Non-pressure chronic ulcer of left calf with fat layer exposed E11.622 Type 2 diabetes mellitus with other skin ulcer L88 Pyoderma gangrenosum Modifier: Quantity: Electronic Signature(s) Signed: 05/28/2021 4:38:47 PM By: [name], [name], RN, [name], [name], [name] Previous Signature: 05/28/2021 3:47:49 PM Version By: Worthy Keeler PA-C Entered By: [name], [name], RN, [name], [name] on  05/28/2021 15:57:07 °

## 2021-05-30 ENCOUNTER — Other Ambulatory Visit: Payer: Self-pay

## 2021-05-30 DIAGNOSIS — I872 Venous insufficiency (chronic) (peripheral): Secondary | ICD-10-CM | POA: Diagnosis not present

## 2021-05-30 NOTE — Progress Notes (Signed)
SHAHEEM, PICHON ([id number]) Visit Report for 05/30/2021 Physician Orders Details Patient Name: [name], [name] Date of Service: 05/30/2021 8:00 AM Medical Record Number: [id number] Patient Account Number: [id number] Date of Birth/Sex: Oct 25, 1980 (40 y.o. M) Treating RN: Hansel Feinstein Primary Care Provider: [name] Other Clinician: Referring Provider: [name] Treating Provider/Extender: Tilda Franco in Treatment: [phone] Verbal / Phone Orders: No Diagnosis Coding Follow-up Appointments o Return Appointment in 1 week. o Nurse Visit as needed - twice a week Bathing/ Shower/ Hygiene o Clean wound with Normal Saline or wound cleanser. o May shower with wound dressing protected with water repellent cover or cast protector. Edema Control - Lymphedema / Segmental Compressive Device / Other o Optional: One layer of unna paste to top of compression wrap (to act as an anchor). o Elevate, Exercise Daily and Avoid Standing for Long Periods of Time. o Elevate legs to the level of the heart and pump ankles as often as possible o Elevate leg(s) parallel to the floor when sitting. Wound Treatment Wound #1 - Lower Leg Wound Laterality: Left, Lateral Cleanser: Wound Cleanser 3 x Per Week/30 Days Discharge Instructions: Wash your hands with soap and water. Remove old dressing, discard into plastic bag and place into trash. Cleanse the wound with Wound Cleanser prior to applying a clean dressing using gauze sponges, not tissues or cotton balls. Do not scrub or use excessive force. Pat dry using gauze sponges, not tissue or cotton balls. Peri-Wound Care: Moisturizing Lotion 3 x Per Week/30 Days Discharge Instructions: Suggestions: Theraderm, Eucerin, Cetaphil, or patient preference. Topical: Clobetasol Propionate ointment 0.05%, 60 (g) tube 3 x Per Week/30 Days Primary Dressing: Hydrofera Blue Ready Transfer Foam, 4x5 (in/in) 3 x Per Week/30 Days Discharge Instructions:  Apply Hydrofera Blue Ready to wound bed as directed Secondary Dressing: Zetuvit Plus Silicone Non-bordered 5x5 (in/in) 3 x Per Week/30 Days Compression Wrap: Medichoice 4 layer Compression System, 35-40 mmHG (Generic) 3 x Per Week/30 Days Discharge Instructions: Apply multi-layer wrap as directed. Electronic Signature(s) Signed: 05/30/2021 8:39:53 AM By: Hansel Feinstein Signed: 05/30/2021 8:46:59 AM By: Geralyn Corwin DO Entered By: Hansel Feinstein on 05/30/2021 08:27:08 [name] ([id number]) -------------------------------------------------------------------------------- SuperBill Details Patient Name: [name] Date of Service: 05/30/2021 Medical Record Number: [id number] Patient Account Number: [id number] Date of Birth/Sex: 02/27/1981 (40 y.o. M) Treating RN: Hansel Feinstein Primary Care Provider: [name] Other Clinician: Referring Provider: [name] Treating Provider/Extender: Tilda Franco in Treatment: 234 Diagnosis Coding ICD-10 Codes Code Description I87.2 Venous insufficiency (chronic) (peripheral) L97.222 Non-pressure chronic ulcer of left calf with fat layer exposed E11.622 Type 2 diabetes mellitus with other skin ulcer L88 Pyoderma gangrenosum F17.208 Nicotine dependence, unspecified, with other nicotine-induced disorders Facility Procedures CPT4 Code: 15945859 Description: (Facility Use Only) [phone] - APPLY MULTLAY COMPRS LWR LT LEG Modifier: Quantity: 1 Electronic Signature(s) Signed: 05/30/2021 8:39:53 AM By: Hansel Feinstein Signed: 05/30/2021 8:46:59 AM By: Geralyn Corwin DO Entered ByHansel Feinstein on 05/30/2021 86:38:17

## 2021-05-30 NOTE — Progress Notes (Signed)
BURNHAM, TROST ([id number]) Visit Report for 05/30/2021 Arrival Information Details Patient Name: [name], [name] Date of Service: 05/30/2021 8:00 AM Medical Record Number: [id number] Patient Account Number: [id number] Date of Birth/Sex: 1980-09-23 (40 y.o. M) Treating RN: Donnamarie Poag Primary Care Vaishnavi Dalby: [name] Other Clinician: Referring Jayme Cham: [name] Treating Emmarie Sannes/Extender: Yaakov Guthrie in Treatment: 50 Visit Information History Since Last Visit Added or deleted any medications: No Patient Arrived: Ambulatory Had a fall or experienced change in No Arrival Time: 08:04 activities of daily living that may affect Accompanied By: self risk of falls: Transfer Assistance: None Hospitalized since last visit: No Patient Identification Verified: Yes Has Dressing in Place as Prescribed: Yes Secondary Verification Process Completed: Yes Has Compression in Place as Prescribed: Yes Patient Requires Transmission-Based No Pain Present Now: No Precautions: Patient Has Alerts: Yes Patient Alerts: Patient has reaction to silver dressings. Electronic Signature(s) Signed: 05/30/2021 8:39:53 AM By: Donnamarie Poag Entered By: Donnamarie Poag on 05/30/2021 08:24:06 [name] ([id number]) -------------------------------------------------------------------------------- Compression Therapy Details Patient Name: [name] Date of Service: 05/30/2021 8:00 AM Medical Record Number: [id number] Patient Account Number: [id number] Date of Birth/Sex: Nov 23, 1980 (40 y.o. M) Treating RN: Donnamarie Poag Primary Care Arron Mcnaught: [name] Other Clinician: Referring Arrayah Connors: [name] Treating Andry Bogden/Extender: Yaakov Guthrie in Treatment: 234 Compression Therapy Performed for Wound Assessment: Wound #1 Left,Lateral Lower Leg Performed By: Junius Argyle, RN Compression Type: Four Layer Electronic Signature(s) Signed: 05/30/2021 8:39:53 AM By: Donnamarie Poag Entered By: Donnamarie Poag on 05/30/2021 08:26:06 [name] ([id number]) -------------------------------------------------------------------------------- Encounter Discharge Information Details Patient Name: [name] Date of Service: 05/30/2021 8:00 AM Medical Record Number: [id number] Patient Account Number: [id number] Date of Birth/Sex: 09-Sep-1980 (40 y.o. M) Treating RN: Donnamarie Poag Primary Care Brandt Chaney: [name] Other Clinician: Referring Treyvonne Tata: [name] Treating Geralda Baumgardner/Extender: Yaakov Guthrie in Treatment: 234 Encounter Discharge Information Items Discharge Condition: Stable Ambulatory Status: Ambulatory Discharge Destination: Home Transportation: Private Auto Accompanied By: self Schedule Follow-up Appointment: Yes Clinical Summary of Care: Electronic Signature(s) Signed: 05/30/2021 8:39:53 AM By: Donnamarie Poag Entered By: Donnamarie Poag on 05/30/2021 08:27:43 [name] ([id number]) -------------------------------------------------------------------------------- Wound Assessment Details Patient Name: [name] Date of Service: 05/30/2021 8:00 AM Medical Record Number: [id number] Patient Account Number: [id number] Date of Birth/Sex: February 04, 1981 (40 y.o. M) Treating RN: Donnamarie Poag Primary Care Nathaniel Yaden: [name] Other Clinician: Referring Blessed Cotham: [name] Treating Kyleena Scheirer/Extender: Yaakov Guthrie in Treatment: 234 Wound Status Wound Number: 1 Primary Etiology: Pyoderma Wound Location: Left, Lateral Lower Leg Wound Status: Open Wounding Event: Gradually Appeared Comorbid History: Sleep Apnea, Hypertension, Colitis Date Acquired: 11/18/2015 Weeks Of Treatment: 234 Clustered Wound: No Photos Wound Measurements Length: (cm) 2.5 Width: (cm) 2 Depth: (cm) 0.1 Area: (cm) 3.927 Volume: (cm) 0.393 % Reduction in Area: 20% % Reduction in Volume: 90% Epithelialization: Medium (34-66%) Tunneling:  No Undermining: No Wound Description Classification: Full Thickness With Exposed Support Structures Wound Margin: Flat and Intact Exudate Amount: Medium Exudate Type: Serosanguineous Exudate Color: red, brown Foul Odor After Cleansing: No Slough/Fibrino Yes Wound Bed Granulation Amount: Large (67-100%) Exposed Structure Granulation Quality: Red, Pink Fascia Exposed: No Necrotic Amount: Small (1-33%) Fat Layer (Subcutaneous Tissue) Exposed: Yes Necrotic Quality: Adherent Slough Tendon Exposed: No Muscle Exposed: No Joint Exposed: No Bone Exposed: No Treatment Notes Wound #1 (Lower Leg) Wound Laterality: Left, Lateral Cleanser Wound Cleanser Discharge Instruction: Wash your hands with soap and water. Remove old dressing, discard into plastic bag and place into trash. Cleanse the wound with Wound Cleanser prior to applying a  clean dressing using gauze sponges, not tissues or cotton balls. Do not scrub or use excessive force. Pat dry using gauze sponges, not tissue or cotton balls. CURVIN, HUNGER ([id number]) Harmony Lotion Discharge Instruction: Suggestions: Theraderm, Eucerin, Cetaphil, or patient preference. Topical Clobetasol Propionate ointment 0.05%, 60 (g) tube Primary Dressing Hydrofera Blue Ready Transfer Foam, 4x5 (in/in) Discharge Instruction: Apply Hydrofera Blue Ready to wound bed as directed Secondary Dressing Zetuvit Plus Silicone Non-bordered 5x5 (in/in) Secured With Compression Wrap Medichoice 4 layer Compression System, 35-40 mmHG Discharge Instruction: Apply multi-layer wrap as directed. Compression Stockings Add-Ons Electronic Signature(s) Signed: 05/30/2021 8:39:53 AM By: Donnamarie Poag Entered ByDonnamarie Poag on 05/30/2021 08:25:29

## 2021-06-01 ENCOUNTER — Other Ambulatory Visit: Payer: Self-pay

## 2021-06-01 DIAGNOSIS — I872 Venous insufficiency (chronic) (peripheral): Secondary | ICD-10-CM | POA: Diagnosis not present

## 2021-06-01 NOTE — Progress Notes (Signed)
NEYTHAN, KOZLOV ([id number]) Visit Report for 06/01/2021 Arrival Information Details Patient Name: AHMOD, GILLESPIE Date of Service: 06/01/2021 8:00 AM Medical Record Number: [id number] Patient Account Number: [id number] Date of Birth/Sex: 1980-07-03 (40 y.o. M) Treating RN: Donnamarie Poag Primary Care Shakima Nisley: [name] Other Clinician: Referring Marshayla Mitschke: [name] Treating Ben Sanz/Extender: [name] Cliche in Treatment: 27 Visit Information History Since Last Visit Added or deleted any medications: No Patient Arrived: Ambulatory Had a fall or experienced change in No Arrival Time: 08:04 activities of daily living that may affect Accompanied By: self risk of falls: Transfer Assistance: None Hospitalized since last visit: No Patient Identification Verified: Yes Has Dressing in Place as Prescribed: Yes Secondary Verification Process Completed: Yes Pain Present Now: No Patient Requires Transmission-Based No Precautions: Patient Has Alerts: Yes Patient Alerts: Patient has reaction to silver dressings. Electronic Signature(s) Signed: 06/01/2021 4:02:12 PM By: Donnamarie Poag Entered By: Donnamarie Poag on 06/01/2021 08:12:58 [name] ([id number]) -------------------------------------------------------------------------------- Compression Therapy Details Patient Name: [name] Date of Service: 06/01/2021 8:00 AM Medical Record Number: [id number] Patient Account Number: [id number] Date of Birth/Sex: [birth year] (40 y.o. M) Treating RN: Donnamarie Poag Primary Care Garhett Bernhard: [name] Other Clinician: Referring Geo Slone: [name] Treating Trayshawn Durkin/Extender: [name] Cliche in Treatment: 234 Compression Therapy Performed for Wound Assessment: Wound #1 Left,Lateral Lower Leg Performed By: Junius Argyle, RN Compression Type: Four Layer Electronic Signature(s) Signed: 06/01/2021 4:02:12 PM By: Donnamarie Poag Entered By: Donnamarie Poag on 06/01/2021  08:23:45 [name] ([id number]) -------------------------------------------------------------------------------- Encounter Discharge Information Details Patient Name: [name] Date of Service: 06/01/2021 8:00 AM Medical Record Number: [id number] Patient Account Number: [id number] Date of Birth/Sex: 01-05-1981 (40 y.o. M) Treating RN: Donnamarie Poag Primary Care Hasheem Voland: [name] Other Clinician: Referring Harlowe Dowler: [name] Treating Sherissa Tenenbaum/Extender: [name] Cliche in Treatment: 234 Encounter Discharge Information Items Discharge Condition: Stable Ambulatory Status: Ambulatory Discharge Destination: Home Transportation: Private Auto Accompanied By: self Schedule Follow-up Appointment: Yes Clinical Summary of Care: Electronic Signature(s) Signed: 06/01/2021 4:02:12 PM By: Donnamarie Poag Entered By: Donnamarie Poag on 06/01/2021 08:26:54 [name] ([id number]) -------------------------------------------------------------------------------- Wound Assessment Details Patient Name: [name] Date of Service: 06/01/2021 8:00 AM Medical Record Number: [id number] Patient Account Number: [id number] Date of Birth/Sex: 1980-08-02 (40 y.o. M) Treating RN: Donnamarie Poag Primary Care Ardon Franklin: [name] Other Clinician: Referring Gery Sabedra: [name] Treating Amelia Macken/Extender: [name] Cliche in Treatment: 234 Wound Status Wound Number: 1 Primary Etiology: Pyoderma Wound Location: Left, Lateral Lower Leg Wound Status: Open Wounding Event: Gradually Appeared Comorbid History: Sleep Apnea, Hypertension, Colitis Date Acquired: 11/18/2015 Weeks Of Treatment: 234 Clustered Wound: No Photos Wound Measurements Length: (cm) 2.5 Width: (cm) 1 Depth: (cm) 0.1 Area: (cm) 1.963 Volume: (cm) 0.196 % Reduction in Area: 60% % Reduction in Volume: 95% Epithelialization: Medium (34-66%) Tunneling: No Undermining: No Wound Description Classification: Full  Thickness With Exposed Support Structures Wound Margin: Flat and Intact Exudate Amount: Medium Exudate Type: Serosanguineous Exudate Color: red, brown Foul Odor After Cleansing: No Slough/Fibrino Yes Wound Bed Granulation Amount: Large (67-100%) Exposed Structure Granulation Quality: Red, Pink Fascia Exposed: No Necrotic Amount: Small (1-33%) Fat Layer (Subcutaneous Tissue) Exposed: Yes Necrotic Quality: Adherent Slough Tendon Exposed: No Muscle Exposed: No Joint Exposed: No Bone Exposed: No Treatment Notes Wound #1 (Lower Leg) Wound Laterality: Left, Lateral Cleanser Wound Cleanser Discharge Instruction: Wash your hands with soap and water. Remove old dressing, discard into plastic bag and place into trash. Cleanse the wound with Wound Cleanser prior to applying a clean dressing using gauze sponges, not tissues  or cotton balls. Do not scrub or use excessive force. Pat dry using gauze sponges, not tissue or cotton balls. TARAS, RASK ([id number]) Sweetser Lotion Discharge Instruction: Suggestions: Theraderm, Eucerin, Cetaphil, or patient preference. Topical Clobetasol Propionate ointment 0.05%, 60 (g) tube Discharge Instruction: Pt to bring to appt- Primary Dressing Hydrofera Blue Ready Transfer Foam, 2.5x2.5 (in/in) Discharge Instruction: Apply Hydrofera Blue Ready to wound bed as directed Secondary Dressing Zetuvit Plus Silicone Non-bordered 5x5 (in/in) Secured With Compression Wrap Medichoice 4 layer Compression System, 35-40 mmHG Discharge Instruction: Apply multi-layer wrap as directed. Compression Stockings Add-Ons Electronic Signature(s) Signed: 06/01/2021 4:02:12 PM By: Donnamarie Poag Entered ByDonnamarie Poag on 06/01/2021 38:70:65

## 2021-06-01 NOTE — Progress Notes (Signed)
[name], [name] ([id number]) Visit Report for 06/01/2021 Physician Orders Details Patient Name: [name], [name] Date of Service: 06/01/2021 8:00 AM Medical Record Number: [id number] Patient Account Number: [id number] Date of Birth/Sex: 1981/05/19 (41 y.o. M) Treating RN: Donnamarie Poag Primary Care Provider: [name] Other Clinician: Referring Provider: [name] Treating Provider/Extender: [name] Cliche in Treatment: [phone] Verbal / Phone Orders: No Diagnosis Coding Follow-up Appointments o Return Appointment in 1 week. o Nurse Visit as needed - twice a week Bathing/ Shower/ Hygiene o Clean wound with Normal Saline or wound cleanser. o May shower with wound dressing protected with water repellent cover or cast protector. Edema Control - Lymphedema / Segmental Compressive Device / Other o Optional: One layer of unna paste to top of compression wrap (to act as an anchor). o Elevate, Exercise Daily and Avoid Standing for Long Periods of Time. o Elevate legs to the level of the heart and pump ankles as often as possible o Elevate leg(s) parallel to the floor when sitting. Wound Treatment Wound #1 - Lower Leg Wound Laterality: Left, Lateral Cleanser: Wound Cleanser 3 x Per Week/30 Days Discharge Instructions: Wash your hands with soap and water. Remove old dressing, discard into plastic bag and place into trash. Cleanse the wound with Wound Cleanser prior to applying a clean dressing using gauze sponges, not tissues or cotton balls. Do not scrub or use excessive force. Pat dry using gauze sponges, not tissue or cotton balls. Peri-Wound Care: Moisturizing Lotion 3 x Per Week/30 Days Discharge Instructions: Suggestions: Theraderm, Eucerin, Cetaphil, or patient preference. Topical: Clobetasol Propionate ointment 0.05%, 60 (g) tube 3 x Per Week/30 Days Discharge Instructions: Pt to bring to appt- Primary Dressing: Hydrofera Blue Ready Transfer Foam, 4x5 (in/in) 3 x Per  Week/30 Days Discharge Instructions: Apply Hydrofera Blue Ready to wound bed as directed Secondary Dressing: Zetuvit Plus Silicone Non-bordered 5x5 (in/in) 3 x Per Week/30 Days Compression Wrap: Medichoice 4 layer Compression System, 35-40 mmHG (Generic) 3 x Per Week/30 Days Discharge Instructions: Apply multi-layer wrap as directed. Electronic Signature(s) Signed: 06/01/2021 9:05:45 AM By: Worthy Keeler PA-C Signed: 06/01/2021 4:02:12 PM By: Donnamarie Poag Entered By: Donnamarie Poag on 06/01/2021 08:25:38 [name] ([id number]) -------------------------------------------------------------------------------- SuperBill Details Patient Name: [name] Date of Service: 06/01/2021 Medical Record Number: [id number] Patient Account Number: [id number] Date of Birth/Sex: 04/30/1981 (40 y.o. 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

## 2021-06-04 ENCOUNTER — Other Ambulatory Visit: Payer: Self-pay

## 2021-06-04 ENCOUNTER — Encounter: Payer: 59 | Admitting: Internal Medicine

## 2021-06-04 DIAGNOSIS — I872 Venous insufficiency (chronic) (peripheral): Secondary | ICD-10-CM | POA: Diagnosis not present

## 2021-06-05 NOTE — Progress Notes (Signed)
[name] ([id number]) Visit Report for 06/04/2021 Arrival Information Details Patient Name: [name] Date of Service: 06/04/2021 8:15 AM Medical Record Number: [id number] Patient Account Number: [id number] Date of Birth/Sex: Jul 22, 1980 (41 y.o. M) Treating RN: Levora Dredge Primary Care Jenet Durio: [name] Other Clinician: Referring Darnella Zeiter: [name] Treating Larry Alcock/Extender: Tito Dine in Treatment: 74 Visit Information History Since Last Visit Added or deleted any medications: No Patient Arrived: Ambulatory Any new allergies or adverse reactions: No Arrival Time: 08:11 Had a fall or experienced change in No Accompanied By: self activities of daily living that may affect Transfer Assistance: None risk of falls: Patient Identification Verified: Yes Hospitalized since last visit: No Secondary Verification Process Completed: Yes Has Dressing in Place as Prescribed: Yes Patient Requires Transmission-Based No Has Compression in Place as Prescribed: Yes Precautions: Pain Present Now: No Patient Has Alerts: Yes Patient Alerts: Patient has reaction to silver dressings. Electronic Signature(s) Signed: 06/05/2021 10:24:24 AM By: Levora Dredge Entered By: Levora Dredge on 06/04/2021 08:13:48 [name] ([id number]) -------------------------------------------------------------------------------- Clinic Level of Care Assessment Details Patient Name: [name] Date of Service: 06/04/2021 8:15 AM Medical Record Number: [id number] Patient Account Number: [id number] Date of Birth/Sex: 09/06/1980 (40 y.o. M) Treating RN: Levora Dredge Primary Care Ruari Duggan: [name] Other Clinician: Referring Mairyn Lenahan: [name] Treating Areli Jowett/Extender: Tito Dine in Treatment: 30 Clinic Level of Care Assessment Items TOOL 4 Quantity Score X - Use when only an EandM is performed on FOLLOW-UP visit 1 0 ASSESSMENTS - Nursing  Assessment / Reassessment []  - Reassessment of Co-morbidities (includes updates in patient status) 0 []  - 0 Reassessment of Adherence to Treatment Plan ASSESSMENTS - Wound and Skin Assessment / Reassessment X - Simple Wound Assessment / Reassessment - one wound 1 5 []  - 0 Complex Wound Assessment / Reassessment - multiple wounds []  - 0 Dermatologic / Skin Assessment (not related to wound area) ASSESSMENTS - Focused Assessment []  - Circumferential Edema Measurements - multi extremities 0 []  - 0 Nutritional Assessment / Counseling / Intervention []  - 0 Lower Extremity Assessment (monofilament, tuning fork, pulses) []  - 0 Peripheral Arterial Disease Assessment (using hand held doppler) ASSESSMENTS - Ostomy and/or Continence Assessment and Care []  - Incontinence Assessment and Management 0 []  - 0 Ostomy Care Assessment and Management (repouching, etc.) PROCESS - Coordination of Care X - Simple Patient / Family Education for ongoing care 1 15 []  - 0 Complex (extensive) Patient / Family Education for ongoing care []  - 0 Staff obtains Programmer, systems, Records, Test Results / Process Orders []  - 0 Staff telephones HHA, Nursing Homes / Clarify orders / etc []  - 0 Routine Transfer to another Facility (non-emergent condition) []  - 0 Routine Hospital Admission (non-emergent condition) []  - 0 New Admissions / Biomedical engineer / Ordering NPWT, Apligraf, etc. []  - 0 Emergency Hospital Admission (emergent condition) X- 1 10 Simple Discharge Coordination []  - 0 Complex (extensive) Discharge Coordination PROCESS - Special Needs []  - Pediatric / Minor Patient Management 0 []  - 0 Isolation Patient Management []  - 0 Hearing / Language / Visual special needs []  - 0 Assessment of Community assistance (transportation, D/C planning, etc.) []  - 0 Additional assistance / Altered mentation []  - 0 Support Surface(s) Assessment (bed, cushion, seat, etc.) INTERVENTIONS - Wound Cleansing /  Measurement [name], [name] ([id number]) X- 1 5 Simple Wound Cleansing - one wound []  - 0 Complex Wound Cleansing - multiple wounds X- 1 5 Wound Imaging (photographs - any number of wounds) []  - 0 Wound  Tracing (instead of photographs) X- 1 5 Simple Wound Measurement - one wound []  - 0 Complex Wound Measurement - multiple wounds INTERVENTIONS - Wound Dressings X - Small Wound Dressing one or multiple wounds 1 10 []  - 0 Medium Wound Dressing one or multiple wounds []  - 0 Large Wound Dressing one or multiple wounds []  - 0 Application of Medications - topical []  - 0 Application of Medications - injection INTERVENTIONS - Miscellaneous []  - External ear exam 0 []  - 0 Specimen Collection (cultures, biopsies, blood, body fluids, etc.) []  - 0 Specimen(s) / Culture(s) sent or taken to Lab for analysis []  - 0 Patient Transfer (multiple staff / Civil Service fast streamer / Similar devices) []  - 0 Simple Staple / Suture removal (25 or less) []  - 0 Complex Staple / Suture removal (26 or more) []  - 0 Hypo / Hyperglycemic Management (close monitor of Blood Glucose) []  - 0 Ankle / Brachial Index (ABI) - do not check if billed separately X- 1 5 Vital Signs Has the patient been seen at the hospital within the last three years: Yes Total Score: 60 Level Of Care: New/Established - Level 2 Electronic Signature(s) Signed: 06/05/2021 10:24:24 AM By: Levora Dredge Entered By: Levora Dredge on 06/04/2021 08:49:30 [name] ([id number]) -------------------------------------------------------------------------------- Encounter Discharge Information Details Patient Name: [name] Date of Service: 06/04/2021 8:15 AM Medical Record Number: [id number] Patient Account Number: [id number] Date of Birth/Sex: [birth year] (40 y.o. M) Treating RN: Levora Dredge Primary Care Jaziah Kwasnik: [name] Other Clinician: Referring Kaytlen Lightsey: [name] Treating Ellina Sivertsen/Extender: Tito Dine  in Treatment: 234 Encounter Discharge Information Items Discharge Condition: Stable Ambulatory Status: Ambulatory Discharge Destination: Home Transportation: Private Auto Accompanied By: self Schedule Follow-up Appointment: Yes Clinical Summary of Care: Electronic Signature(s) Signed: 06/05/2021 10:24:24 AM By: Levora Dredge Entered By: Levora Dredge on 06/04/2021 08:51:27 [name] ([id number]) -------------------------------------------------------------------------------- Lower Extremity Assessment Details Patient Name: [name] Date of Service: 06/04/2021 8:15 AM Medical Record Number: [id number] Patient Account Number: [id number] Date of Birth/Sex: 28-Sep-1980 (40 y.o. M) Treating RN: Levora Dredge Primary Care Shalia Bartko: [name] Other Clinician: Referring Ioanna Colquhoun: [name] Treating Mariana Wiederholt/Extender: Tito Dine in Treatment: 234 Edema Assessment Assessed: [Left: No] [Right: No] [Left: Edema] [Right: :] Calf Left: Right: Point of Measurement: 35 cm From Medial Instep 47.6 cm Ankle Left: Right: Point of Measurement: 10 cm From Medial Instep 28.7 cm Vascular Assessment Pulses: Dorsalis Pedis Palpable: [Left:Yes] Electronic Signature(s) Signed: 06/05/2021 10:24:24 AM By: Levora Dredge Entered By: Levora Dredge on 06/04/2021 08:26:02 [name] ([id number]) -------------------------------------------------------------------------------- Multi Wound Chart Details Patient Name: [name] Date of Service: 06/04/2021 8:15 AM Medical Record Number: [id number] Patient Account Number: [id number] Date of Birth/Sex: 13-Feb-1981 (40 y.o. M) Treating RN: Levora Dredge Primary Care Saman Giddens: [name] Other Clinician: Referring Caoimhe Damron: [name] Treating Emillee Talsma/Extender: Tito Dine in Treatment: 234 Vital Signs Height(in): 71 Pulse(bpm): 92 Weight(lbs): 338 Blood Pressure(mmHg): 133/83 Body Mass  Index(BMI): 47 Temperature(F): 97.8 Respiratory Rate(breaths/min): 18 Photos: [N/A:N/A] Wound Location: Left, Lateral Lower Leg N/A N/A Wounding Event: Gradually Appeared N/A N/A Primary Etiology: Pyoderma N/A N/A Comorbid History: Sleep Apnea, Hypertension, Colitis N/A N/A Date Acquired: 11/18/2015 N/A N/A Weeks of Treatment: 234 N/A N/A Wound Status: Open N/A N/A Measurements L x W x D (cm) 2.3x1x0.1 N/A N/A Area (cm) : 1.806 N/A N/A Volume (cm) : 0.181 N/A N/A % Reduction in Area: 63.20% N/A N/A % Reduction in Volume: 95.40% N/A N/A Classification: Full Thickness With Exposed N/A N/A Support Structures Exudate  Amount: Medium N/A N/A Exudate Type: Serosanguineous N/A N/A Exudate Color: red, brown N/A N/A Wound Margin: Flat and Intact N/A N/A Granulation Amount: Large (67-100%) N/A N/A Granulation Quality: Red, Pink N/A N/A Necrotic Amount: Small (1-33%) N/A N/A Exposed Structures: Fat Layer (Subcutaneous Tissue): N/A N/A Yes Fascia: No Tendon: No Muscle: No Joint: No Bone: No Epithelialization: Medium (34-66%) N/A N/A Treatment Notes Electronic Signature(s) Signed: 06/05/2021 10:24:24 AM By: Levora Dredge Entered By: Levora Dredge on 06/04/2021 08:30:22 [name] ([id number]) -------------------------------------------------------------------------------- Multi-Disciplinary Care Plan Details Patient Name: [name] Date of Service: 06/04/2021 8:15 AM Medical Record Number: [id number] Patient Account Number: [id number] Date of Birth/Sex: [birth year] (40 y.o. M) Treating RN: Levora Dredge Primary Care Sabien Umland: [name] Other Clinician: Referring Jair Lindblad: [name] Treating Timera Windt/Extender: Tito Dine in Treatment: Luis Lopez reviewed with physician Active Inactive Necrotic Tissue Nursing Diagnoses: Impaired tissue integrity related to necrotic/devitalized tissue Knowledge deficit related to  management of necrotic/devitalized tissue Goals: Necrotic/devitalized tissue will be minimized in the wound bed Date Initiated: 03/26/2021 Target Resolution Date: 05/26/2021 Goal Status: Active Patient/caregiver will verbalize understanding of reason and process for debridement of necrotic tissue Date Initiated: 03/26/2021 Date Inactivated: 04/27/2021 Target Resolution Date: 03/26/2021 Goal Status: Met Interventions: Assess patient pain level pre-, during and post procedure and prior to discharge Provide education on necrotic tissue and debridement process Treatment Activities: Apply topical anesthetic as ordered : 03/26/2021 Notes: Electronic Signature(s) Signed: 06/05/2021 10:24:24 AM By: Levora Dredge Entered By: Levora Dredge on 06/04/2021 08:29:55 [name] ([id number]) -------------------------------------------------------------------------------- Pain Assessment Details Patient Name: [name] Date of Service: 06/04/2021 8:15 AM Medical Record Number: [id number] Patient Account Number: [id number] Date of Birth/Sex: 1980-06-16 (40 y.o. M) Treating RN: Levora Dredge Primary Care Alphonsine Minium: [name] Other Clinician: Referring Raeli Wiens: [name] Treating Brayli Klingbeil/Extender: Tito Dine in Treatment: 234 Active Problems Location of Pain Severity and Description of Pain Patient Has Paino No Site Locations Rate the pain. Current Pain Level: 0 Pain Management and Medication Current Pain Management: Electronic Signature(s) Signed: 06/05/2021 10:24:24 AM By: Levora Dredge Entered By: Levora Dredge on 06/04/2021 08:15:51 [name] ([id number]) -------------------------------------------------------------------------------- Patient/Caregiver Education Details Patient Name: [name] Date of Service: 06/04/2021 8:15 AM Medical Record Number: [id number] Patient Account Number: [id number] Date of Birth/Gender: [birth year] (40 y.o. M) Treating  RN: Levora Dredge Primary Care Physician: [name] Other Clinician: Referring Physician: [name] Treating Physician/Extender: Tito Dine in Treatment: 50 Education Assessment Education Provided To: Patient Education Topics Provided Wound/Skin Impairment: Handouts: Caring for Your Ulcer Methods: Explain/Verbal Responses: State content correctly Electronic Signature(s) Signed: 06/05/2021 10:24:24 AM By: Levora Dredge Entered By: Levora Dredge on 06/04/2021 08:49:56 [name] ([id number]) -------------------------------------------------------------------------------- Wound Assessment Details Patient Name: [name] Date of Service: 06/04/2021 8:15 AM Medical Record Number: [id number] Patient Account Number: [id number] Date of Birth/Sex: 11/30/1980 (40 y.o. M) Treating RN: Levora Dredge Primary Care Nyeshia Mysliwiec: [name] Other Clinician: Referring Zaidee Rion: [name] Treating Daisa Stennis/Extender: Tito Dine in Treatment: 234 Wound Status Wound Number: 1 Primary Etiology: Pyoderma Wound Location: Left, Lateral Lower Leg Wound Status: Open Wounding Event: Gradually Appeared Comorbid History: Sleep Apnea, Hypertension, Colitis Date Acquired: 11/18/2015 Weeks Of Treatment: 234 Clustered Wound: No Photos Wound Measurements Length: (cm) 2.3 Width: (cm) 1 Depth: (cm) 0.1 Area: (cm) 1.806 Volume: (cm) 0.181 % Reduction in Area: 63.2% % Reduction in Volume: 95.4% Epithelialization: Medium (34-66%) Tunneling: No Undermining: No Wound Description Classification: Full Thickness With Exposed Support Structures Wound Margin: Flat and  Intact Exudate Amount: Medium Exudate Type: Serosanguineous Exudate Color: red, brown Foul Odor After Cleansing: No Slough/Fibrino Yes Wound Bed Granulation Amount: Large (67-100%) Exposed Structure Granulation Quality: Red, Pink Fascia Exposed: No Necrotic Amount: Small  (1-33%) Fat Layer (Subcutaneous Tissue) Exposed: Yes Necrotic Quality: Adherent Slough Tendon Exposed: No Muscle Exposed: No Joint Exposed: No Bone Exposed: No Treatment Notes Wound #1 (Lower Leg) Wound Laterality: Left, Lateral Cleanser Wound Cleanser Discharge Instruction: Wash your hands with soap and water. Remove old dressing, discard into plastic bag and place into trash. Cleanse the wound with Wound Cleanser prior to applying a clean dressing using gauze sponges, not tissues or cotton balls. Do not scrub or use excessive force. Pat dry using gauze sponges, not tissue or cotton balls. [name], [name] ([id number]) Cosmos Lotion Discharge Instruction: Suggestions: Theraderm, Eucerin, Cetaphil, or patient preference. Topical Clobetasol Propionate ointment 0.05%, 60 (g) tube Discharge Instruction: Pt to bring to appt- Primary Dressing Hydrofera Blue Ready Transfer Foam, 4x5 (in/in) Discharge Instruction: Apply Hydrofera Blue Ready to wound bed as directed Secondary Dressing Zetuvit Plus Silicone Non-bordered 5x5 (in/in) Secured With Compression Wrap Medichoice 4 layer Compression System, 35-40 mmHG Discharge Instruction: Apply multi-layer wrap as directed. Compression Stockings Add-Ons Electronic Signature(s) Signed: 06/05/2021 10:24:24 AM By: Levora Dredge Entered By: Levora Dredge on 06/04/2021 08:25:02 [name], [name] ([id number]) -------------------------------------------------------------------------------- Vitals Details Patient Name: [name] Date of Service: 06/04/2021 8:15 AM Medical Record Number: [id number] Patient Account Number: [id number] Date of Birth/Sex: 1980/05/25 (40 y.o. M) Treating RN: Levora Dredge Primary Care Merida Alcantar: [name] Other Clinician: Referring Seniyah Esker: [name] Treating Mertie Haslem/Extender: Tito Dine in Treatment: 234 Vital Signs Time Taken: 08:15 Temperature (F): 97.8 Height  (in): 71 Pulse (bpm): 92 Weight (lbs): 338 Respiratory Rate (breaths/min): 18 Body Mass Index (BMI): 47.1 Blood Pressure (mmHg): 133/83 Reference Range: 80 - 120 mg / dl Electronic Signature(s) Signed: 06/05/2021 10:24:24 AM By: Levora Dredge Entered By: Levora Dredge on 06/04/2021 08:15:42

## 2021-06-05 NOTE — Progress Notes (Signed)
[name] ([id number]) Visit Report for 06/04/2021 HPI Details Patient Name: [name] Date of Service: 06/04/2021 8:15 AM Medical Record Number: [id number] Patient Account Number: [id number] Date of Birth/Sex: 05-15-1981 (41 y.o. M) Treating RN: Levora Dredge Primary Care Provider: [name] Other Clinician: Referring Provider: [name] Treating Provider/Extender: Tito Dine in Treatment: 37 History of Present Illness HPI Description: 12/04/16; 41 year old man who comes into the clinic today for review of a wound on the posterior left calf. He tells me that is been there for about a year. He is not a diabetic he does smoke half a pack per day. He was seen in the ER on 11/20/16 felt to have cellulitis around the wound and was given clindamycin. An x-ray did not show osteomyelitis. The patient initially tells me that he has a milk allergy that sets off a pruritic itching rash on his lower legs which she scratches incessantly and he thinks that's what may have set up the wound. He has been using various topical antibiotics and ointments without any effect. He works in a [employer] and is on his feet all day. He does not have a prior history of wounds however he does have the rash on both lower legs the right arm and the ventral aspect of his left arm. These are excoriations and clearly have had scratching however there are of macular looking areas on both legs including a substantial larger area on the right leg. This does not have an underlying open area. There is no blistering. The patient tells me that 2 years ago in [place] in response to the rash on his legs he saw a dermatologist who told him he had a condition which may be pyoderma gangrenosum although I may be putting words into his mouth. He seemed to recognize this. On further questioning he admits to a 5 year history of quiesced. ulcerative colitis. He is not in any treatment for this. He's had no recent  travel 12/11/16; the patient arrives today with his wound and roughly the same condition we've been using silver alginate this is a deep punched out wound with some surrounding erythema but no tenderness. Biopsy I did did not show confirmed pyoderma gangrenosum suggested nonspecific inflammation and vasculitis but does not provide an actual description of what was seen by the pathologist. I'm really not able to understand this We have also received information from the patient's dermatologist in [place] notes from April 2016. This was a doctor [name]. 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 He was prescribed topical steroid high potency under occlusion which helped but at this point the patient did not have a deep punched out wound. 12/18/16; the patient's wound is larger in terms of surface area however this surface looks better and there is less depth. The surrounding erythema also is better. The patient states that the wrap we put on came off 2 days ago when he has been using his compression stockings. He we are in the process of getting a dermatology consult. 12/26/16 on evaluation today patient's left lower extremity wound shows evidence of infection with surrounding erythema noted. He has been tolerating the dressing changes but states that he has noted more discomfort. There is a larger area of erythema surrounding the wound. No fevers, chills, nausea, or vomiting noted at this time. With that being said the wound still does have slough covering the surface. He is not allergic to any medication that he is aware of at this point.  In regard to his right lower extremity he had several regions that are erythematous and pruritic he wonders if there's anything we can do to help that. 01/02/17 I reviewed patient's wound culture which was obtained his visit last week. He was placed on doxycycline at that point. Unfortunately that does not appear to be an antibiotic that would likely  help with the situation however the pseudomonas noted on culture is sensitive to Cipro. Also unfortunately patient's wound seems to have a large compared to last week's evaluation. Not severely so but there are definitely increased measurements in general. He is continuing to have discomfort as well he writes this to be a seven out of 10. In fact he would prefer me not to perform any debridement today due to the fact that he is having discomfort and considering he has an active infection on the little reluctant to do so anyway. No fevers, chills, nausea, or vomiting noted at this time. 01/08/17; patient seems dermatology on September 5. I suspect dermatology will want the slides from the biopsy I did sent to their pathologist. I'm not sure if there is a way we can expedite that. In any case the culture I did before I left on vacation 3 weeks ago showed Pseudomonas he was given 10 days of Cipro and per her description of her intake nurses is actually somewhat better this week although the wound is quite a bit bigger than I remember the last time I saw this. He still has 3 more days of Cipro 01/21/17; dermatology appointment tomorrow. He has completed the ciprofloxacin for Pseudomonas. Surface of the wound looks better however he is had some deterioration in the lesions on his right leg. Meantime the left lateral leg wound we will continue with sample 01/29/17; patient had his dermatology appointment but I can't yet see that note. He is completed his antibiotics. The wound is more superficial but considerably larger in circumferential area than when he came in. This is in his left lateral calf. He also has swollen erythematous areas with superficial wounds on the right leg and small papular areas on both arms. There apparently areas in her his upper thighs and buttocks I did not look at those. Dermatology biopsied the right leg. Hopefully will have their input next week. 02/05/17; patient went back to see  his dermatologist who told him that he had a "scratching problem" as well as staph. He is now on a 30 day course of doxycycline and I believe she gave him triamcinolone cream to the right leg areas to help with the itching [not exactly sure but probably triamcinolone]. She apparently looked at the left lateral leg wound although this was not rebiopsied and I think felt to be ultimately part of the same pathogenesis. He is using sample border foam and changing nevus himself. He now has a new open area on the right posterior leg which was his biopsy site I don't have any of the dermatology notes 02/12/17; we put the patient in compression last week with SANTYL to the wound on the left leg and the biopsy. Edema is much better and the depth of the wound is now at level of skin. Area is still the same oBiopsy site on the right lateral leg we've also been using santyl with a border foam dressing and he is changing this himself. 02/19/17; Using silver alginate started last week to both the substantial left leg wound and the biopsy site on the right wound. He is tolerating compression  well. Has a an appointment with his primary M.D. tomorrow wondering about diuretics although I'm wondering if the edema problem is actually lymphedema 02/26/17; the patient has been to see his primary doctor Dr. Jerrel Ivory at [facility] our primary [facility]. She started him on Lasix 20 mg and this [place], Wong ([id number]) seems to have helped with the edema. However we are not making substantial change with the left lateral calf wound and inflammation. The biopsy site on the right leg also looks stable but not really all that different. 03/12/17; the patient has been to see vein and vascular Dr. Lucky Cowboy. He has had venous reflux studies I have not reviewed these. I did get a call from his dermatology office. They felt that he might have pathergy based on their biopsy on his right leg which led them to look at the slides of the biopsy  I did on the left leg and they wonder whether this represents pyoderma gangrenosum which was the original supposition in a man with ulcerative colitis albeit inactive for many years. They therefore recommended clobetasol and tetracycline i.e. aggressive treatment for possible pyoderma gangrenosum. 03/26/17; apparently the patient just had reflux studies not an appointment with Dr. dew. She arrives in clinic today having applied clobetasol for 2-3 weeks. He notes over the last 2-3 days excessive drainage having to change the dressing 3-4 times a day and also expanding erythema. 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 Lillia Carmel at [facility] dermatology locally who kindly saw him at my request. 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 My concern was the degree of erythema medially extending from his left leg wound which was either inflammation from pyoderma or cellulitis. I put him on Augmentin however culture of the wound showed Pseudomonas which is quinolone sensitive. I really don't believe he has cellulitis however in view of everything I will continue and give him a course of Cipro. He is also on doxycycline as an immune modulator for the pyoderma. In addition to his original wound on the left lateral leg with surrounding erythema he has a wound on the right posterior calf which was an original biopsy site done by dermatology. This was felt to represent pathergy from pyoderma gangrenosum 04/16/17; pyoderma gangrenosum. Saw Dr. Lillia Carmel yesterday. He has been using topical antibiotics to both wound areas his original wound on the left and the biopsies/pathergy area on the right. There  is definitely some improvement in the inflammation around the wound on the right although the patient states he has increasing sensitivity of the wounds. He is on prednisone 60 and doxycycline 1 as prescribed by Dr. Lillia Carmel. He is covering the topical antibiotic with gauze and putting this in his own compression stocks and changing this daily. He states that Dr. Lottie Rater did a culture of the left leg wound yesterday 05/07/17; pyoderma gangrenosum. The patient saw Dr. Lillia Carmel yesterday and has a follow-up with her in one month. He is still using topical antibiotics to both wounds although he can't recall exactly what type. He is still on prednisone 60 mg. Dr. Lillia Carmel stated that the doxycycline could stop if we were in agreement. 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 The right medial leg was induced by biopsy/pathergy. The area on the right is essentially healed. 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 The right medial leg has closed and remains closed. He is still on prednisone 60. oHe tells me he missed his last dermatology appointment with Dr. Carles Collet but will make another appointment. He reports that her blood sugar at a recent screen in [place] was high 200's. He was 180 today. 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 I think he is going to require still much longer prednisone perhaps another 3 months before attempting to taper. In the meantime his wound is a lot better. Smaller. He is cleaning this off daily and applying topical antibiotics. When he was last in the clinic I thought about changing to [facility] and actually put in a couple of calls to dermatology although probably not during their business hours. In any case the wound looks better smaller I don't think there is  any need to change what he is doing 08/06/17-he is here in follow up evaluation for pyoderma left leg ulcer. He continues on oral prednisone. He has been using triple antibiotic ointment. There is surface debris and we will transition to [facility] and have him return in 2 weeks. He has lost 30 pounds since his last appointment with lifestyle modification. He may benefit from topical steroid cream for treatment this can be considered at a later date. 08/22/17 on evaluation today patient appears to actually be doing rather well in regard to his left lateral lower extremity ulcer. He has actually been managed by Dr. Leanord Hawking most recently. Patient is currently on oral steroids at this time. This seems to have been of benefit for him. Nonetheless his last visit was actually with Leah on 08/06/17. Currently he is not utilizing any topical steroid creams although this could be of benefit as well. No fevers, chills, nausea, or vomiting noted at this time. 09/05/17 on evaluation today patient appears to be doing better in regard to his left lateral lower extremity ulcer. He has been tolerating the dressing changes without complication. He is using Santyl with good effect. Overall I'm very pleased with how things are standing at this point. Patient likewise is happy that this is doing better. 09/19/17 on evaluation today patient actually appears to be doing rather well in regard to his left lateral lower extremity ulcer. 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 He's not having any significant pain. 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 He has been tolerating the Santyl without complication and in general I feel like he's making good progress. 10/17/17 on evaluation today patient appears to be doing very well in regard to his left  lateral lower surety ulcer. He has been tolerating the dressing changes without complication. There does not appear to be any evidence of infection he's alternating the Santyl and the triple antibiotic ointment every other day this seems to be doing well for him. 11/03/17 on evaluation today patient appears to be doing very well in regard to his left lateral lower extremity ulcer. He is been tolerating the dressing changes without complication which is good news. Fortunately there does not  appear to be any evidence of infection which is also great news. Overall is doing excellent they are starting to taper down on the prednisone is down to 40 mg at this point it also started topical clobetasol for him. 11/17/17 on evaluation today patient appears to be doing well in regard to his left lateral lower surety ulcer. He's been tolerating the dressing changes without complication. He does note that he is having no pain, no excessive drainage or discharge, and overall he feels like things are going about how he would expect and hope they would. Overall he seems to have no evidence of infection at this time in my opinion which is good news. 12/04/17-He is seen in follow-up evaluation for right lateral lower extremity ulcer. He has been applying topical steroid cream. Today's measurement show slight increase in size. Over the next 2 weeks we will transition to every other day Santyl and steroid cream. He has been encouraged to monitor for changes and notify clinic with any concerns 12/15/17 on evaluation today patient's left lateral motion the ulcer and fortunately is doing worse again at this point. This just since last week to this week has close to doubled in size according to the patient. I did not seeing last week's I do not have a visual to compare this to in our system was also down so we do not have all the charts and at this point. Nonetheless it does have me somewhat concerned in regard to the fact  that again he was worried enough about it he has contact the dermatology that placed them back on the full strength, 50 mg a day of the prednisone that he was taken previous. He continues to alternate using clobetasol along with Santyl at this point. He is obviously somewhat frustrated. DEARION, YOHMAN ([id number]) 12/22/17 on evaluation today patient appears to be doing a little worse compared to last evaluation. Unfortunately the wound is a little deeper and slightly larger than the last week's evaluation. With that being said he has made some progress in regard to the irritation surrounding at this time unfortunately despite that progress that's been made he still has a significant issue going on here. 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 For that reason I discussed with him today the possibility of perform a wound culture to ensure there's nothing overtly infected. 01/06/18 on evaluation today patient's wound is larger and deeper than previously evaluated. With that being said it did appear that his wound was infected after my last evaluation with him. Subsequently I did end up prescribing a prescription for Bactrim DS which she has been taking and having no complication with. Fortunately there does not appear to be any evidence of infection at this point in time as far as anything spreading, no want to touch, and overall I feel like things are showing signs of improvement. 01/13/18 on evaluation today patient appears to be even a little larger and deeper than last time. There still muscle exposed in the base of the wound. Nonetheless he does appear to be less erythematous I do believe inflammation is calming down also believe the infection looks like it's probably resolved at this time based on what I'm seeing. No fevers, chills, nausea, or  vomiting noted at this time. 01/30/18 on evaluation today patient actually appears to visually look better for the most part. Unfortunately  those visually this looks better he does seem to potentially have what may be an abscess in the muscle that has been noted in the central portion of the wound. This is the first time that I have noted what appears to be fluctuance in the central portion of the muscle. With that being said I'm somewhat more concerned about the fact that this might indicate an abscess formation at this location. I do believe that an ultrasound would be appropriate. This is likely something we need to try to do as soon as possible. He has been switch to mupirocin ointment and he is no longer using the steroid ointment as prescribed by dermatology he sees them again next week he's been decreased from 60 to 40 mg of prednisone. 03/09/18 on evaluation today patient actually appears to be doing a little better compared to last time I saw him. There's not as much erythema surrounding the wound itself. He I did review his most recent infectious disease note which was dated 02/24/18. He saw Dr. Michel Bickers in [place]. 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 Patient is now off of antibiotics and they are continually observing this. There seems to be no change in the past two weeks in my pinion based on what the patient says and what I see today compared to what Dr. Megan Salon likely saw two weeks ago. No fevers, chills, nausea, or vomiting noted at this time. 03/23/18 on evaluation today patient's wound actually appears to be showing signs of improvement which is good news. He is currently still on the Dapsone. He is also working on tapering the prednisone to get off of this and Dr. Lottie Rater is working with him in this regard. Nonetheless overall I feel like the wound is doing well it does appear  based on the infectious disease note that I reviewed from Dr. 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 I did also review the results of his ultrasound of left lower extremity which revealed there was a dentist tissue in the base of the wound without an abscess noted. 04/06/18 on evaluation today the patient's left lateral lower extremity ulcer actually appears to be doing fairly well which is excellent news. There does not appear to be any evidence of infection at this time which is also great news. Overall he still does have a significantly large ulceration although little by little he seems to be making progress. He is down to 10 mg a day of the prednisone. 04/20/18 on evaluation today patient actually appears to be doing excellent at this time in regard to his left lower extremity ulcer. He's making signs of good progress unfortunately this is taking much longer than we would really like to see but nonetheless he is making progress. Fortunately there does not appear to be any evidence of infection at this time. No fevers, chills, nausea, or vomiting noted at this time. The patient has not been using the Santyl due to the cost he hadn't got in this field yet. He's mainly been using the antibiotic ointment topically. Subsequently he also tells me that he really has not been scrubbing in the shower I think this would be helpful again as I told him it doesn't have to be anything too aggressive to even make it  believe just enough to keep it free of some of the loose slough and biofilm on the wound surface. 05/11/18 on evaluation today patient's wound appears to be making slow but sure progress in regard to the left lateral lower extremity ulcer. He is been tolerating the dressing changes without complication. Fortunately there does not appear to be any evidence of infection at this time. He  is still just using triple antibiotic ointment along with clobetasol occasionally over the area. He never got the Santyl and really does not seem to intend to in my pinion. 06/01/18 on evaluation today patient appears to be doing a little better in regard to his left lateral lower extremity ulcer. He states that overall he does not feel like he is doing as well with the Dapsone as he did with the prednisone. Nonetheless he sees his dermatologist later today and is gonna talk to them about the possibility of going back on the prednisone. Overall again I believe that the wound would be better if you would utilize Santyl but he really does not seem to be interested in going back to the [place] at this point. He has been using triple antibiotic ointment. 06/15/18 on evaluation today patient's wound actually appears to be doing about the same at this point. Fortunately there is no signs of infection at this time. He has made slight improvements although he continues to not really want to clean the wound bed at this point. He states that he just doesn't mess with it he doesn't want to cause any problems with everything else he has going on. He has been on medication, antibiotics as prescribed by his dermatologist, for a staff infection of his lower extremities which is really drying out now and looking much better he tells me. Fortunately there is no sign of overall infection. 06/29/18 on evaluation today patient appears to be doing well in regard to his left lateral lower surety ulcer all things considering. Fortunately his staff infection seems to be greatly improved compared to previous. He has no signs of infection and this is drying up quite nicely. He is still the doxycycline for this is no longer on cental, Dapsone, or any of the other medications. His dermatologist has recommended possibility of an infusion but right now he does not want to proceed with that. 07/13/18 on evaluation today patient appears  to be doing about the same in regard to his left lateral lower surety ulcer. Fortunately there's no signs of infection at this time which is great news. Unfortunately he still builds up a significant amount of Slough/biofilm of the surface of the wound he still is not really cleaning this as he should be appropriately. Again I'm able to easily with saline and gauze remove the majority of this on the surface which if you would do this at home would likely be a dramatic improvement for him as far as getting the area to improve. Nonetheless overall I still feel like he is making progress is just very slow. I think Santyl will be of benefit for him as well. Still he has not gotten this as of this point. 07/27/18 on evaluation today patient actually appears to be doing little worse in regards of the erythema around the periwound region of the wound he also tells me that he's been having more drainage currently compared to what he was experiencing last time I saw him. He states not quite as bad as what he had because this was infected previously but nonetheless  is still appears to be doing poorly. Fortunately there is no evidence of systemic infection at this point. The patient tells me that he is not going to be able to afford the Santyl. He is still waiting to hear about the infusion therapy with his dermatologist. Apparently she wants an updated colonoscopy first. RADAMES, KARI ([id number]) 08/10/18 on evaluation today patient appears to be doing better in regard to his left lateral lower extremity ulcer. Fortunately he is showing signs of improvement in this regard he's actually been approved for Remicade infusion's as well although this has not been scheduled as of yet. Fortunately there's no signs of active infection at this time in regard to the wound although he is having some issues with infection of the right lower extremity is been seen as dermatologist for this. Fortunately they are definitely still  working with him trying to keep things under control. 09/07/18 on evaluation today patient is actually doing rather well in regard to his left lateral lower extremity ulcer. He notes these actually having some hair grow back on his extremity which is something he has not seen in years. He also tells me that the pain is really not giving them any trouble at this time which is also good news overall she is very pleased with the progress he's using a combination of the mupirocin along with the probate is all mixed. 09/21/18 on evaluation today patient actually appears to be doing fairly well all things considered in regard to his looks from the ulcer. He's been tolerating the dressing changes without complication. Fortunately there's no signs of active infection at this time which is good news he is still on all antibiotics or prevention of the staff infection. He has been on prednisone for time although he states it is gonna contact his dermatologist and see if she put them on a short course due to some irritation that he has going on currently. Fortunately there's no evidence of any overall worsening this is going very slow I think cental would be something that would be helpful for him although he states that $50 for tube is quite expensive. He therefore is not willing to get that at this point. 10/06/18 on evaluation today patient actually appears to be doing decently well in regard to his left lateral leg ulcer. He's been tolerating the dressing changes without complication. Fortunately there's no signs of active infection at this time. Overall I'm actually rather pleased with the progress he's making although it's slow he doesn't show any signs of infection and he does seem to be making some improvement. I do believe that he may need a switch up and dressings to try to help this to heal more appropriately and quickly. 10/19/18 on evaluation today patient actually appears to be doing better in regard to  his left lateral lower extremity ulcer. This is shown signs of having much less Slough buildup at this point due to the fact he has been using the [employer]. Obviously this is very good news. The overall size of the wound is not dramatically smaller but again the appearance is. 11/02/18 on evaluation today patient actually appears to be doing quite well in regard to his lower [place] ulcer. A lot of the skin around the ulcer is actually somewhat irritating at this point this seems to be more due to the dressing causing irritation from the adhesive that anything else. Fortunately there is no signs of active infection at this time. 11/24/18 on evaluation today patient appears  to be doing a little worse in regard to his overall appearance of his lower extremity ulcer. There's more erythema and warmth around the wound unfortunately. He is currently on doxycycline which he has been on for some time. With that being said I'm not sure that seems to be helping with what appears to possibly be an acute cellulitis with regard to his left lower extremity ulcer. No fevers, chills, nausea, or vomiting noted at this time. 12/08/18 on evaluation today patient's wounds actually appears to be doing significantly better compared to his last evaluation. He has been using Santyl along with alternating tripling about appointment as well as the steroid cream seems to be doing quite well and the wound is showing signs of improvement which is excellent news. Fortunately there's no evidence of infection and in fact his culture came back negative with only normal skin flora noted. 12/21/2018 upon evaluation today patient actually appears to be doing excellent with regard to his ulcer. This is actually the best that I have seen it since have been helping to take care of him. It is both smaller as well as less slough noted on the surface of the wound and seems to be showing signs of good improvement with new skin growing from the edges.  He has been using just the triamcinolone he does wonder if he can get a refill of that ointment today. 01/04/2019 upon evaluation today patient actually appears to be doing well with regard to his left lateral lower extremity ulcer. With that being said it does not appear to be that he is doing quite as well as last time as far as progression is concerned. There does not appear to be any signs of infection or significant irritation which is good news. With that being said I do believe that he may benefit from switching to a collagen based dressing based on how clean The wound appears. 01/18/2019 on evaluation today patient actually appears to be doing well with regard to his wound on the left lower extremity. He is not made a lot of progress compared to where we were previous but nonetheless does seem to be doing okay at this time which is good news. There is no signs of active infection which is also good news. My only concern currently is I do wish we can get him into utilizing the collagen dressing his insurance would not pay for the supplies that we ordered although it appears that he may be able to order this through his supply company that he typically utilizes. This is [employer]. Nonetheless he did try to order it during the office visit today and it appears this did go through. We will see if he can get that it is a different brand but nonetheless he has collagen and I do think will be beneficial. 02/01/2019 on evaluation today patient actually appears to be doing a little worse today in regard to the overall size of his wounds. Fortunately there is no signs of active infection at this time. That is visually. Nonetheless when this is happened before it was due to infection. For that reason were somewhat concerned about that this time as well. 02/08/2019 on evaluation today patient unfortunately appears to be doing slightly worse with regard to his wound upon evaluation today. Is measuring a little  deeper and a little larger unfortunately. I am not really sure exactly what is causing this to enlarge he actually did see his dermatologist she is going to see about initiating Humira  for him. Subsequently she also did do steroid injections into the wound itself in the periphery. Nonetheless still nonetheless he seems to be getting a little bit larger he is gone back to just using the steroid cream topically which I think is appropriate. I would say hold off on the collagen for the time being is definitely a good thing to do. Based on the culture results which we finally did get the final result back regarding it shows staph as the bacteria noted again that can be a normal skin bacteria based on the fact however he is having increased drainage and worsening of the wound measurement wise I would go ahead and place him on an antibiotic today I do believe for this. 02/15/2019 on evaluation today patient actually appears to be doing somewhat better in regard to his ulcer. There is no signs of worsening at this time I did review his culture results which showed evidence of Staphylococcus aureus but not MRSA. Again this could just be more related to the normal skin bacteria although he states the drainage has slowed down quite a bit he may have had a mild infection not just colonization. And was much smaller and then since around10/04/2019 on evaluation today patient appears to be doing unfortunately worse as far as the size of the wound. I really feel like that this is steadily getting larger again it had been doing excellent right at the beginning of September we have seen a steady increase in the area of the wound it is almost 2-1/2 times the size it was on September 1. Obviously this is a bad trend this is not wanting to see. For that reason we went back to using just the topical triamcinolone cream which does seem to help with inflammation. I checked him for bacteria by way of culture and nothing showed  positive there. I am considering giving him a short course of a tapering steroid Dosepak today to see if that is can be beneficial for him. The patient is in agreement with giving that a try. MIKIYAS, MENTER ([id number]) 03/08/2019 on evaluation today patient appears to be doing very well in comparison to last evaluation with regard to his lower extremity ulcer. This is showing signs of less inflammation and actually measuring slightly smaller compared to last time every other week over the past month and a half he has been measuring larger larger larger. Nonetheless I do believe that the issue has been inflammation the prednisone does seem to have been beneficial for him which is good news. No fevers, chills, nausea, vomiting, or diarrhea. 03/22/2019 on evaluation today patient appears to be doing about the same with regard to his leg ulcer. He has been tolerating the dressing changes without complication. With that being said the wound seems to be mostly arrested at its current size but really is not making any progress except for when we prescribed the prednisone. He did show some signs of dropping as far as the overall size of the wound during that interval week. Nonetheless this is something he is not on long-term at this point and unfortunately I think he is getting need either this or else the Humira which his dermatologist has discussed try to get approval for. With that being said he will be seeing his dermatologist on the 11th of this month that is November. 04/19/2019 on evaluation today patient appears to be doing really about the same the wound is measuring slightly larger compared to last time I saw him.  He has not been into the office since November 2 due to the fact that he unfortunately had Covid as that his entire family. He tells me that it was rough but they did pull-through and he seems to be doing much better. Fortunately there is no signs of active infection at this time.  No fevers, chills, nausea, vomiting, or diarrhea. 05/10/2019 on evaluation today patient unfortunately appears to be doing significantly worse as compared to last time I saw him. He does tell me that he has had his first dose of Humira and actually is scheduled to get the next one in the upcoming week. With that being said he tells me also that in the past several days he has been having a lot of issues with green drainage she showed me a picture this is more blue-green in color. He is also been having issues with increased sloughy buildup and the wound does appear to be larger today. Obviously this is not the direction that we want everything to take based on the starting of his Humira. Nonetheless I think this is definitely a result of likely infection and to be honest I think this is probably Pseudomonas causing the infection based on what I am seeing. 05/24/2019 on evaluation today patient unfortunately appears to be doing significantly worse compared to his prior evaluation with me 2 weeks ago. I did review his culture results which showed that he does have Staph aureus as well as Pseudomonas noted on the culture. Nonetheless the Levaquin that I prescribed for him does not appear to have been appropriate and in fact he tells me he is no longer experiencing the green drainage and discharge that he had at the last visit. Fortunately there is no signs of active infection at this time which is good news although the wound has significantly worsened it in fact is much deeper than it was previous. We have been utilizing up to this point triamcinolone ointment as the prescription topical of choice but at this time I really feel like that the wound is getting need to be packed in order to appropriately manage this due to the deeper nature of the wound. Therefore something along the lines of an alginate dressing may be more appropriate. 05/31/2019 upon inspection today patient's wound actually showed signs  of doing poorly at this point. Unfortunately he just does not seem to be making any good progress despite what we have tried. He actually did go ahead and pick up the Cipro and start taking that as he was noticing more green drainage he had previously completed the Levaquin that I prescribed for him as well. Nonetheless he missed his appointment for the seventh last week on Wednesday with the wound care center and [facility] where his dermatologist referred him. Obviously I do think a second opinion would be helpful at this point especially in light of the fact that the patient seems to be doing so poorly despite the fact that we have tried everything that I really know how at this point. The only thing that ever seems to have helped him in the past is when he was on high doses of continual steroids that did seem to make a difference for him. 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 06/07/2019 upon evaluation today patient unfortunately  appears to be doing worse yet again with regard to his wound. In fact I am starting to question whether or not he may have a fluid pocket in the muscle at this point based on the bulging and the soft appearance to the central portion of the muscle area. There is not anything draining from the muscle itself at this time which is good news but nonetheless the wound is expanding. I am not really seeing any results of the Humira as far as overall wound progression based on what I am seeing at this point. The patient has been referred for second opinion with regard to his wound to the [facility] wound care center by his dermatologist which I definitely am not in opposition to. Unfortunately we tried multiple dressings in the past including collagen, alginate, and at one point even Hydrofera Blue. With that being said he  is never really used it for any significant amount of time due to the fact that he often complains of pain associated with these dressings and then will go back to either using the Santyl which she has done intermittently or more frequently the triamcinolone. He is also using his own compression stockings. We have wrapped him in the past but again that was something else that he really was not a big fan of. 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 He seemed to do much better when he was on the steroids although honestly I understand that the steroids are not necessarily the best medication to be on long-term obviously 06/14/2019 on evaluation today patient appears to be doing actually a little bit better with regard to the overall appearance with his leg. Unfortunately he does continue to have issues with what appears to be some fluid underneath the muscle although he did see the wound specialty center at [facility] last week their main goals were to see about infusion therapy in place of the Humira as they feel like that is not quite strong enough. They also recommended that we continue with the treatment otherwise as we are they felt like that was appropriate and they are okay with him continuing to follow-up here with [place] in that regard. With that being said they are also sending him to the vein specialist there to see about vein stripping and if that would be of benefit for him. Subsequently they also did not really address whether or not an ultrasound of the muscle area to see if there is anything that needs to be addressed here would be appropriate or not. For that reason I discussed  this with him last week I think we may proceed down that road at this point. 06/21/2019 upon evaluation today patient's wound actually appears to be doing slightly better compared to previous evaluations. I do believe that he has made a difference with regard to the progression here with the use of oral steroids. Again in the past has been the only thing that is really calm things down. He does tell me that from [facility] is gotten a good news from there that there are no further vein stripping that is necessary at this point. I do not  have that available for review today although the patient did relay this to me. He also did obtain and have the ultrasound of the wound completed which I did sign off on today. It does appear that there is no fluid collection under the muscle this is likely then just edematous tissue in general. That is also good news. Overall I still believe the inflammation is the main issue here. He did inquire about the possibility of a wound VAC again with the muscle protruding like it is I am not really sure whether the wound VAC is necessarily ideal or not. That is something we will have to consider although I do believe he may need compression wrapping to try to help with edema control which could potentially be of benefit. 06/28/2019 on evaluation today patient appears to be doing slightly better measurement wise although this is not terribly smaller he least seems to be trending towards that direction. With that being said he still seems to have purulent drainage noted in the wound bed at this time. He has been on Levaquin followed by Cipro over the past month. Unfortunately he still seems to have some issues with active infection at this time. I did perform a culture last week in order to evaluate and see if indeed there was still anything going on. Subsequently the culture did come back showing Pseudomonas which is consistent with the drainage has been having which is blue-green in  color. He also has had an odor that again was RAEJON, DEVEAU ([id number]) somewhat consistent with Pseudomonas as well. Long story short it appears that the culture showed an intermediate finding with regard to how well the Cipro will work for the Pseudomonas infection. Subsequently being that he does not seem to be clearing up and at best what we are doing is just keeping this at [place] I think he may need to see infectious disease to discuss IV antibiotic options. 07/05/2019 upon evaluation today patient appears to be doing okay in regard to his leg ulcer. He has been tolerating the dressing changes at this point without complication. Fortunately there is no signs of active infection at this time which is good news. No fevers, chills, nausea, vomiting, or diarrhea. With that being said he does have an appointment with infectious disease tomorrow and his primary care on Wednesday. Again the reason for the infectious disease referral was due to the fact that he did not seem to be fully resolving with the use of oral antibiotics and therefore we were thinking that IV antibiotic therapy may be necessary secondary to the fact that there was an intermediate finding for how effective the Cipro may be. Nonetheless again he has been having a lot of purulent and even green drainage. Fortunately right now that seems to have calmed down over the past week with the reinitiation of the oral antibiotic. Nonetheless we will see what Dr. Megan Salon has to say. 07/12/2019 upon evaluation today patient appears to be doing about the same at this point in regard to his left lower extremity ulcer. Fortunately there is no signs of active infection at this time which is good news I do believe the Levaquin has been beneficial I did review Dr. Hale Bogus note and to be honest I agree that the patient's leg does appear to be doing better currently. What we found in the past as he does not seem to really completely resolve he will stop  the antibiotic and then subsequently things will revert back to having issues  with blue-green drainage, increased pain, and overall worsening in general. Obviously that is the reason I sent him back to infectious disease. 07/19/2019 upon evaluation today patient appears to be doing roughly the same in size there is really no dramatic improvement. He has started back on the Levaquin at this point and though he seems to be doing okay he did still have a lot of blue/green drainage noted on evaluation today unfortunately. I think that this is still indicative more likely of a Pseudomonas infection as previously noted and again he does see Dr. Megan Salon in just a couple of days. I do not know that were really able to effectively clear this with just oral antibiotics alone based on what I am seeing currently. Nonetheless we are still continue to try to manage as best we can with regard to the patient and his wound. I do think the wrap was helpful in decreasing the edema which is excellent news. No fevers, chills, nausea, vomiting, or diarrhea. 07/26/2019 upon evaluation today patient appears to be doing slightly better with regard to the overall appearance of the muscle there is no dark discoloration centrally. Fortunately there is no signs of active infection at this time. No fevers, chills, nausea, vomiting, or diarrhea. Patient's wound bed currently the patient did have an appointment with Dr. Megan Salon at infectious disease last week. With that being said Dr. Megan Salon the patient states was still somewhat hesitant about put him on any IV antibiotics he wanted [place] to repeat cultures today and then see where things go going forward. He does look like Dr. Megan Salon because of some improvement the patient did have with the Levaquin wanted [place] to see about repeating cultures. If it indeed grows the Pseudomonas again then he recommended a possibility of considering a PICC line placement and IV antibiotic therapy. He  plans to see the patient back in 1 to 2 weeks. 08/02/2019 upon evaluation today patient appears to be doing poorly with regard to his left lower extremity. We did get the results of his culture back it shows that he is still showing evidence of Pseudomonas which is consistent with the purulent/blue-green drainage that he has currently. Subsequently the culture also shows that he now is showing resistance to the oral fluoroquinolones which is unfortunate as that was really the only thing to treat the infection prior. I do believe that he is looking like this is going require IV antibiotic therapy to get this under control. Fortunately there is no signs of systemic infection at this time which is good news. The patient does see Dr. Megan Salon tomorrow. 08/09/2019 upon evaluation today patient appears to be doing better with regard to his left lower extremity ulcer in regard to the overall appearance. He is currently on IV antibiotic therapy. As ordered by Dr. Megan Salon. Currently the patient is on ceftazidime which she is going to take for the next 2 weeks and then follow-up for 4 to 5-week appointment with Dr. Megan Salon. The patient started this this past Friday symptoms have not for a total of 3 days currently in full. 08/16/2019 upon evaluation today patient's wound actually does show muscle in the base of the wound but in general does appear to be much better as far as the overall evidence of infection is concerned. In fact I feel like this is for the most part cleared up he still on the IV antibiotics he has not completed the full course yet but I think he is doing much better which is  excellent news. 08/23/2019 upon evaluation today patient appears to be doing about the same with regard to his wound at this point. He tells me that he still has pain unfortunately. Fortunately there is no evidence of systemic infection at this time which is great news. There is significant muscle protrusion. 09/13/19 upon  evaluation today patient appears to be doing about the same in regard to his leg unfortunately. He still has a lot of drainage coming from the ulceration there is still muscle exposed. With that being said the patient's last wound culture still showed an intermediate finding with regard to the Pseudomonas he still having the bluish/green drainage as well. Overall I do not know that the wound has completely cleared of infection at this point. Fortunately there is no signs of active infection systemically at this point which is good news. 09/20/2019 upon evaluation today patient's wound actually appears to be doing about the same based on what I am seeing currently. I do not see any signs of systemic infection he still does have evidence of some local infection and drainage. He did see Dr. Megan Salon last week and Dr. Megan Salon states that he probably does need a different IV antibiotic although he does not want to put him on this until the patient begins the Remicade infusion which is actually scheduled for about 10 days out from today on 13 May. Following that time Dr. Megan Salon is good to see him back and then will evaluate the feasibility of starting him on the IV antibiotic therapy once again at that point. I do not disagree with this plan I do believe as Dr. Megan Salon stated in his note that I reviewed today that the patient's issue is multifactorial with the pyoderma being 1 aspect of this that were hoping the Remicade will be helpful for her. In the meantime I think the gentamicin is, helping to keep things under decent okay control in regard to the ulcer. 09/27/2019 upon evaluation today patient appears to be doing about the same with regard to his wound still there is a lot of muscle exposure though he does have some hyper granulation tissue noted around the edge and actually some granulation tissue starting to form over the muscle which is actually good news. Fortunately there is no evidence of active  infection which is also good news. His pain is less at this point. 5/21; this is a patient I have not seen in a long time. 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 He has a large wound on the left lateral leg with protruding muscle. He comes in the clinic today showing the same area on his left medial ankle. He says there is been a spot there for some time although we have not previously defined this. Today he has a clearly defined area with slight amount of skin breakdown surrounded by raised areas with a purplish hue in color. This is not painful he says it is irritated. 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 He still has muscle protruding from the lateral portion of his left leg but fortunately the new area that they were concerned about at his last visit does not appear to have opened at this point. He is currently on Remicade infusions and seems to be doing better in my opinion in fact the wound itself seems to be overall much better. The purplish discoloration that he did have  seems to have resolved and I think that is a good sign that hopefully the Remicade is doing its job. He does have some biofilm noted over the surface of the wound. Laray AngerLAMER, Braycen ([id number]) 11/01/2019 on evaluation today patient's wound actually appears to be doing excellent at this time. Fortunately there is no evidence of active infection and overall I feel like he is making great progress. The Remicade seems to be due excellent job in my opinion. 11/08/19 evaluation today vision actually appears to be doing quite well with regard to his weight ulcer. He's been tolerating dressing changes without complication. Fortunately there is no evidence of infection. No fevers, chills, nausea, or vomiting noted at this time. Overall states that is having more itching than pain which  is actually a good sign in my opinion. 12/13/2019 upon evaluation today patient appears to be doing well today with regard to his wound. He has been tolerating the dressing changes without complication. Fortunately there is no sign of active infection at this time. No fevers, chills, nausea, vomiting, or diarrhea. Overall I feel like the infusion therapy has been very beneficial for him. 01/06/2020 on evaluation today patient appears to be doing well with regard to his wound. This is measuring smaller and actually looks to be doing better. Fortunately there is no signs of active infection at this point. No fevers, chills, nausea, vomiting, or diarrhea. With that being said he does still have the blue-green drainage but this does not seem to be causing any significant issues currently. He has been using the gentamicin that does seem to be keeping things under decent control at this point. He goes later this morning for his next infusion therapy for the pyoderma which seems to also be very beneficial. 02/07/2020 on evaluation today patient appears to be doing about the same in regard to his wounds currently. Fortunately there is no signs of active infection systemically he does still have evidence of local infection still using gentamicin. He also is showing some signs of improvement albeit slowly I do feel like we are making some progress here. 02/21/2020 upon evaluation today patient appears to be making some signs of improvement the wound is measuring a little bit smaller which is great news and overall I am very pleased with where he stands currently. He is going to be having infusion therapy treatment on the 15th of this month. Fortunately there is no signs of active infection at this time. 03/13/2020 I do believe patient's wound is actually showing some signs of improvement here which is great news. He has continue with the [facility] That does seem to  be beneficial. I still think he gets as much benefit from this as he did from the prednisone initially but nonetheless obviously this is less harsh on his body that the prednisone as far as they are concerned. 03/31/2020 on evaluation today patient's wound actually showing signs of some pretty good improvement in regard to the overall appearance of the wound bed. There is still muscle exposed though he does have some epithelial growth around the edges of the wound. Fortunately there is no signs of active infection at this time. No fevers, chills, nausea, vomiting, or diarrhea. 04/24/2020 upon evaluation today patient appears to be doing about the same in regard to his leg ulcer. He has been tolerating the dressing changes without complication. Fortunately there is no signs of active infection at this time. No fevers, chills, nausea, vomiting, or diarrhea. With that  being said he still has a lot of irritation from the bandaging around the edges of the wound. We did discuss today the possibility of a referral to plastic surgery. 05/22/2020 on evaluation today patient appears to be doing well with regard to his wounds all things considered. He has not been able to get the Chantix apparently there is a recall nurse that I was unaware of put out by [employer] involuntarily. Nonetheless for now I am and I have to do some research into what may be the best option for him to help with quitting in regard to smoking and we discussed that today. 06/26/2020 upon evaluation today patient appears to be doing well with regard to his wound from the standpoint of infection I do not see any signs of infection at this point. With that being said unfortunately he is still continuing to have issues with muscle exposure and again he is not having a whole lot of new skin growth unfortunately. There does not appear to be any signs of active infection at this time. No fevers, chills, nausea, vomiting, or diarrhea. 07/10/2020 upon  evaluation today patient appears to be doing a little bit more poorly currently compared to where he was previous. I am concerned currently about an active infection that may be getting worse especially in light of the increased size and tenderness of the wound bed. No fevers, chills, nausea, vomiting, or diarrhea. 07/24/2020 upon evaluation today patient appears to be doing poorly in regard to his leg ulcer. He has been tolerating the dressing changes without complication but unfortunately is having a lot of discomfort. 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 Subsequently I think he is going require possibly IV antibiotics to get this under control. I am very concerned about the severity of his infection and the amount of discomfort he is having. 07/31/2020 upon evaluation today patient appears to be doing about the same in regard to his leg wound. He did see Dr. Megan Salon and Dr. Megan Salon is actually going to start him on IV antibiotics. He goes for the PICC line tomorrow. With that being said there do not have that run for 2 weeks and then see how things are doing and depending on how he is progressing they may extend that a little longer. Nonetheless I am glad this is getting ready to be in place and definitely feel it may help the patient. In the meantime is been using mainly triamcinolone to the wound bed has an anti-inflammatory. 08/07/2020 on evaluation today patient appears to be doing well with regard to his wound compared even last week. In the interim he has gotten the PICC line placed and overall this seems to be doing excellent. There does not appear to be any evidence of infection which is great news systemically although locally of course has had the infection this appears to be improving with the use of the antibiotics. 08/14/2020 upon evaluation today patient's wound actually showing signs of excellent improvement.  Overall the irritation has significantly improved the drainage is back down to more of a normal level and his pain is really pretty much nonexistent compared to what it was. Obviously I think that this is significantly improved secondary to the IV antibiotic therapy which has made all the difference in the world. Again he had a resistant form of Pseudomonas for which oral antibiotics just was not cutting it. Nonetheless I do think that still we need to consider the  possibility of a surgical closure for this wound is been open so long and to be honest with muscle exposed I think this can be very hard to get this to close outside of this although definitely were still working to try to do what we can in that regard. 08/21/2020 upon evaluation today patient appears to be doing very well with regard to his wounds on the left lateral lower extremity/calf area. Fortunately there does not appear to be signs of active infection which is great news and overall very pleased with where things stand today. He is actually wrapping up his treatment with IV antibiotics tomorrow. After that we will see where things go from there. 08/28/2020 upon evaluation today patient appears to be doing decently well with regard to his leg ulcer. There does not appear to be any signs of active infection which is great news and overall very pleased with where things stand today. No fevers, chills, nausea, vomiting, or diarrhea. JAMMAL, KHAM ([id number]) 09/18/2020 upon evaluation today patient appears to be doing well with regard to his infection which I feel like is better. Unfortunately he is not doing as well with regard to the overall size of the wound which is not nearly as good at this point. I feel like that he may be having an issue here with the pyoderma being somewhat out of control. I think that he may benefit from potentially going back and talking to the dermatologist about what to do from the pyoderma standpoint. I am not  certain if the infusions are helping nearly as much is what the prednisone did in the past. 10/02/2020 upon evaluation today patient appears to be doing well with regard to his leg ulcer. He did go to the [facility] nurse. Unfortunately they feel like there is a 10% chance that most that he would be able to heal and that the skin graft would take. Obviously this has led him to not be able to go down that path as far as treatment is concerned. Nonetheless he does seem to be doing a little bit better with the prednisone that I gave him last time. I think that he may need to discuss with dermatology the possibility of long-term prednisone as that seems to be what is most helpful for him to be perfectly honest. I am not sure the Remicade is really doing the job. 10/17/2020 upon evaluation today patient appears to be doing a little better in regard to his wound. In fact the case has been since we did the prednisone on May 2 for him that we have noticed a little bit of improvement each time we have seen a size wise as well as appearance wise as well as pain wise. I think the prednisone has had a greater effect then the infusion therapy has to be perfectly honest. With that being said the patient also feels significantly better compared to what he was previous. All of this is good news but nonetheless I am still concerned about the fact that again we are really not set up to long-term manage him as far as prednisone is concerned. Obviously there are things that you need to be watched I completely understand the risk of prednisone usage as well. 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 With all that being said I do believe that we can give him another round of the prednisone which she is requesting today because of the improvement that he  seen since we did that first round. 10/30/2020 upon evaluation today patient's wound actually is showing signs of doing quite  well. There does not appear to be any evidence of infection which is great news and overall very pleased with where things stand today. No fevers, chills, nausea, vomiting, or diarrhea. He tells me that the prednisone still has seem to have helped he wonders if we can extend that for just a little bit longer. He did not have the appointment with a dermatologist although he did have an infusion appointment last Friday. That was at [facility]. With that being said he tells me he could not do both that as well as the appointment with the physician on the same day therefore that is can have to be rescheduled. I really want to see if there is anything they feel like that could be done differently to try to help this out as I am not really certain that the infusions are helping significantly here. 11/13/2020 upon evaluation today patient unfortunately appears to be doing somewhat poorly in regard to his wound I feel like this is actually worsening from the standpoint of the pyoderma spreading. I still feel like that he may need something different as far as trying to manage this going forward. Again we did the prednisone unfortunately his blood sugars are not doing so well because of this. Nonetheless I believe that the patient likely needs to try topical steroid. We have done triamcinolone for a while I think going with something stronger such as clobetasol could be beneficial again this is not something I do lightly I discussed this with the patient that again this does not normally put underneath an occlusive dressing. Nonetheless I think a thin film as such could help with some of the stronger anti-inflammatory effects. We discussed this today. He would like to try to give this a trial for the next couple weeks. I definitely think that is something that we can do. Evaluate7/03/2021 and today patient's wound bed actually showed signs of doing really about the same. There was a little expansion of the size of the  wound and that leading edge that we done looking out although the clobetasol does seem to have slowed this down a bit in my opinion. There is just 1 small area that still seems to be progressing based on what I see. Nonetheless I am concerned about the fact this does not seem to be improving if anything seems to be doing a little bit worse. I do not know that the infusions are really helping him much as next infusion is August 5 his appointment with dermatology is July 25. Either way I really think that we need to have a conversation potentially about this and I am actually going to see if I can talk with Dr. Lillia Carmel in order to see where things stand as well. 12/11/2020 upon evaluation today patient appears to be doing worse in regard to his leg ulcer. Unfortunately I just do not think this is making the progress that I would like to see at this point. Honestly he does have an appointment with dermatology and this is in 2 days. I am wondering what they may have to offer to help with this. Right now what I am seeing is that he is continuing to show signs of worsening little by little. Obviously that is not great at all. Is the exact opposite of what we are looking for. 12/18/2020 upon evaluation today patient appears to be doing  a little better in regard to his wound. The dermatologist actually did do some steroid injections into the wound which does seem to have been beneficial in my opinion. That was on the 25th already this looks a little better to me than last time I saw him. With that being said we did do a culture and this did show that he has Staph aureus noted in abundance in the wound. With that being said I do think that getting him on an oral antibiotic would be appropriate as well. Also think we can compression wrap and this will make a difference as well. 12/28/2020 upon evaluation today patient's wound is actually showing signs of doing much better. I do believe the compression wrap is  helping he has a lot of drainage but to be honest I think that the compression is helping to some degree in this regard as well as not draining through which is also good news. No fevers, chills, nausea, vomiting, or diarrhea. 01/04/2021 upon evaluation today patient appears to be doing well with regard to his wound. Overall things seem to be doing quite well. He did have a little bit of reaction to the CarboFlex Sorbact he will be using that any longer. With that being said he is controlled as far as the drainage is concerned overall and seems to be doing quite well. I do not see any signs of active infection at this time which is great news. No fevers, chills, nausea, vomiting, or diarrhea. 01/11/2021 upon evaluation today patient appears to be doing well with regard to his wounds. He has been tolerating the dressing changes without complication. Fortunately there does not appear to be any signs of active infection at this time which is great news. Overall I am extremely pleased with where we stand currently. No fevers, chills, nausea, vomiting, or diarrhea. Where using clobetasol in the wound bed he has a lot of new skin growth which is awesome as well. 01/18/2021 upon evaluation today patient appears to be doing very well in regard to his leg ulcer. He has been tolerating the dressing changes without complication. Fortunately there does not appear to be any signs of active infection which is great news. In general I think that he is making excellent progress 01/25/2021 upon evaluation today patient appears to be doing well with regard to his wound on the leg. I am actually extremely pleased with where things stand today. There does not appear to be any signs of active infection which is great news and overall I think that we are definitely headed in the appropriate direction based on what I am seeing currently. There does not appear to be any signs of active infection also excellent news. 02/06/2021  upon evaluation today patient appears to be doing well with regard to his wound. Overall visually this is showing signs of significant improvement which is great news. I do not see any signs of active infection systemically which is great even locally I do not think that we are CONWELL, VITO ([id number]) seeing any major complications here. We did do fluorescence imaging with the [employer] DX today. The patient does have some odor and drainage noted and again this is something that I think would benefit him to probably come more frequently for nurse visits. 02/19/2021 upon evaluation today patient actually appears to be doing quite well in regard to his wound. He has been tolerating the dressing changes without complication and overall I think that this is making excellent progress. I do  not see any evidence of active infection at this point which is great news as well. No fevers, chills, nausea, vomiting, or diarrhea. 10/10; wound is made nice progress healthy granulation with a nice rim of epithelialization which seems to be expanding even from last week he has a deeper area in the inferior part of the more distal part of the wound with not quite as healthy as surface. This area will need to be followed. Using clobetasol and Hydrofera Blue 03/05/2021 upon evaluation today patient appears to be doing very well in regard to his leg ulcer. He has been tolerating dressing changes without complication. Fortunately there does not appear to be any signs of active infection which is great news and overall I am extremely pleased with where we stand currently. 03/12/2021 upon evaluation today patient appears to be doing well with regard to his wound in fact this is extremely extremely good based on what we are seeing today there does not appear to be any signs of active infection and overall I think that he is doing awesome from the standpoint of healing in general. I am extremely pleased with how things  seem to be progressing with regard to this pyoderma. Clobetasol has done wonders for him. I think the compression wrapping has also been of great benefit. 03/19/2021 upon evaluation today patient appears to be doing well with regard to his wound. He is tolerating the dressing changes without complication. In fact I feel like that he is actually making excellent progress at this point based on what I am seeing. No fevers, chills, nausea, vomiting, or diarrhea. 03/26/2021 upon evaluation today patient appears to be doing well with regard to his wound. This again is measuring smaller and looking better. Again the progress is slow but nonetheless continual with what we have been seeing. I do believe that the current plan is doing awesome for him. 04/09/2021 upon evaluation today patient appears to be doing well with regard to his leg ulcer. This is showing signs of excellent improvement the muscle is completely closed over and there does not appear to be any evidence of inflammation at this point his drainage is significantly improved. Overall I think that he would be a good candidate for looking into a skin substitute at this point as well. We will get a look into some approvals in that regard. Potentially TheraSkin as well as Apligraf could both be considered just depending on insurance coverage. 04/16/2021 upon evaluation today patient appears to be doing well at this point. He has been approved for the Apligraf which we could definitely order although I would like to try to get the TheraSkin approved if at all possible. I did fax notes into them today and I Georgina Peer try to give a call as well. Overall the wound appears to be doing decently well today. 04/20/2021 upon evaluation today patient actually appears to be doing quite well in regard to his wound with that being said we are trying to see what we can do about speeding up the healing process. For that reason we did discuss the possibility of a skin  substitute. We got the Apligraf approved. For that reason we will get a go ahead and see what we can do with the Apligraf at this point. I am still trying to get the TheraSkin approved but I have not heard anything from the insurance company yet I have called and talked to them earlier in the week and to be honest this was about half  an hour that I spent on the phone they told me I would hear something within 10-15 business days. 04/27/2021 upon evaluation today patient appears to be doing excellent in regard to his wound. I do believe the Apligraf has been beneficial. With that being said he has had a little bit of increased pain has been a little bit concerned. I do want to go ahead and apply his steroid cream today the clobetasol and then put the Apligraf over I just do not think I want to risk not putting the clobetasol on based on what is been going on here currently. Patient voiced understanding. 05/04/2021 upon evaluation today patient is making excellent improvement. Overall there is definitely decrease in the size of the wound today and I am very pleased in that regard. I do not see any signs of active infection locally nor systemically at this point. No fevers, chills, nausea, vomiting, or diarrhea. 05/11/2021 upon evaluation today patient appears to be doing well with regard to his wound. He has been tolerating Apligraf's without complication and is making excellent progress this is application #4 today. 12/30; patient here for Apligraf application. Appears to be doing well small wound there has been a lot of healing 05/28/2021 upon evaluation today patient appears to be doing well with regard to his wound. Has been tolerating the dressing changes without complication. Fortunately I do not see any signs of active infection locally nor systemically at this point which is great news. He is done with the Apligraf and the first round and overall this has filled in quite nicely I am not even  certain he needs any additional at the moment. I would actually try to go back to clobetasol with [facility] which previously was doing well for him. 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 He is using clobetasol and Hydrofera Blue. Surface area of the wound is smaller and the surface looks very healthy. Electronic Signature(s) Signed: 06/05/2021 7:38:21 AM By: Linton Ham MD Entered By: Linton Ham on 06/04/2021 08:33:52 ATWOOD, WARDROP ([id number]) -------------------------------------------------------------------------------- Physical Exam Details Patient Name: [name] Date of Service: 06/04/2021 8:15 AM Medical Record Number: [id number] Patient Account Number: [id number] Date of Birth/Sex: 11/02/1980 (40 y.o. M) Treating RN: Levora Dredge Primary Care Provider: [name] Other Clinician: Referring Provider: [name] Treating Provider/Extender: Tito Dine in Treatment: 234 Constitutional Sitting or standing Blood Pressure is within target range for patient.. Pulse regular and within target range for patient.Marland Kitchen Respirations regular, non- labored and within target range.. Temperature is normal and within the target range for the patient.Marland Kitchen appears in no distress. Cardiovascular Pedal pulses on the left there palpable. Notes Wound exam; left anterior lower leg. Very healthy looking wound no debridement is required. Healthy granulation surface area measurements are better. No evidence of surrounding infection Electronic Signature(s) Signed: 06/05/2021 7:38:21 AM By: Linton Ham MD Entered By: Linton Ham on 06/04/2021 08:35:00 [name] ([id number]) -------------------------------------------------------------------------------- Physician Orders Details Patient Name: [name] Date of Service: 06/04/2021 8:15 AM Medical Record Number: [id number] Patient Account Number:  [id number] Date of Birth/Sex: February 08, 1981 (40 y.o. M) Treating RN: Levora Dredge Primary Care Provider: [name] Other Clinician: Referring Provider: [name] Treating Provider/Extender: Tito Dine in Treatment: 69 Verbal / Phone Orders: No Diagnosis Coding Follow-up Appointments o Return Appointment in 1 week. o Nurse Visit as needed - twice a week Bathing/ Shower/ Hygiene o Clean wound with Normal Saline or wound cleanser. o May  shower with wound dressing protected with water repellent cover or cast protector. Edema Control - Lymphedema / Segmental Compressive Device / Other o Optional: One layer of unna paste to top of compression wrap (to act as an anchor). o Elevate, Exercise Daily and Avoid Standing for Long Periods of Time. o Elevate legs to the level of the heart and pump ankles as often as possible o Elevate leg(s) parallel to the floor when sitting. Wound Treatment Wound #1 - Lower Leg Wound Laterality: Left, Lateral Cleanser: Wound Cleanser 3 x Per Week/30 Days Discharge Instructions: Wash your hands with soap and water. Remove old dressing, discard into plastic bag and place into trash. Cleanse the wound with Wound Cleanser prior to applying a clean dressing using gauze sponges, not tissues or cotton balls. Do not scrub or use excessive force. Pat dry using gauze sponges, not tissue or cotton balls. Peri-Wound Care: Moisturizing Lotion 3 x Per Week/30 Days Discharge Instructions: Suggestions: Theraderm, Eucerin, Cetaphil, or patient preference. Topical: Clobetasol Propionate ointment 0.05%, 60 (g) tube 3 x Per Week/30 Days Discharge Instructions: Pt to bring to appt- Primary Dressing: Hydrofera Blue Ready Transfer Foam, 4x5 (in/in) 3 x Per Week/30 Days Discharge Instructions: Apply Hydrofera Blue Ready to wound bed as directed Secondary Dressing: Zetuvit Plus Silicone Non-bordered 5x5 (in/in) 3 x Per Week/30 Days Compression  Wrap: Medichoice 4 layer Compression System, 35-40 mmHG (Generic) 3 x Per Week/30 Days Discharge Instructions: Apply multi-layer wrap as directed. Electronic Signature(s) Signed: 06/05/2021 7:38:21 AM By: Linton Ham MD Signed: 06/05/2021 10:24:24 AM By: Levora Dredge Entered By: Levora Dredge on 06/04/2021 08:49:07 LINK, MCFEELEY ([id number]) -------------------------------------------------------------------------------- Problem List Details Patient Name: [name] Date of Service: 06/04/2021 8:15 AM Medical Record Number: [id number] Patient Account Number: [id number] Date of Birth/Sex: 01-27-1981 (40 y.o. M) Treating RN: Levora Dredge Primary Care Provider: [name] Other Clinician: Referring Provider: [name] Treating Provider/Extender: Tito Dine in Treatment: 33 Active Problems ICD-10 Encounter Code Description Active Date MDM Diagnosis I87.2 Venous insufficiency (chronic) (peripheral) 12/04/2016 No Yes L97.222 Non-pressure chronic ulcer of left calf with fat layer exposed 12/04/2016 No Yes E11.622 Type 2 diabetes mellitus with other skin ulcer 04/09/2021 No Yes L88 Pyoderma gangrenosum 03/26/2017 No Yes F17.208 Nicotine dependence, unspecified, with other nicotine-induced disorders 04/24/2020 No Yes Inactive Problems ICD-10 Code Description Active Date Inactive Date L97.213 Non-pressure chronic ulcer of right calf with necrosis of muscle 04/02/2017 04/02/2017 Resolved Problems ICD-10 Code Description Active Date Resolved Date L97.321 Non-pressure chronic ulcer of left ankle limited to breakdown of skin 10/08/2019 10/08/2019 L03.116 Cellulitis of left lower limb 05/24/2019 05/24/2019 Electronic Signature(s) Signed: 06/05/2021 7:38:21 AM By: Linton Ham MD Entered By: Linton Ham on 06/04/2021 08:33:03 [name] ([id number]) -------------------------------------------------------------------------------- Progress Note  Details Patient Name: [name] Date of Service: 06/04/2021 8:15 AM Medical Record Number: [id number] Patient Account Number: [id number] Date of Birth/Sex: Mar 20, 1981 (40 y.o. M) Treating RN: Levora Dredge Primary Care Provider: [name] Other Clinician: Referring Provider: [name] Treating Provider/Extender: Tito Dine in Treatment: 234 Subjective History of Present Illness (HPI) 12/04/16; 41 year old man who comes into the clinic today for review of a wound on the posterior left calf. He tells me that is been there for about a year. He is not a diabetic he does smoke half a pack per day. He was seen in the ER on 11/20/16 felt to have cellulitis around the wound and was given clindamycin. An x-ray did not show osteomyelitis. The patient initially tells me that he  has a milk allergy that sets off a pruritic itching rash on his lower legs which she scratches incessantly and he thinks that's what may have set up the wound. He has been using various topical antibiotics and ointments without any effect. He works in a [employer] and is on his feet all day. He does not have a prior history of wounds however he does have the rash on both lower legs the right arm and the ventral aspect of his left arm. These are excoriations and clearly have had scratching however there are of macular looking areas on both legs including a substantial larger area on the right leg. This does not have an underlying open area. There is no blistering. The patient tells me that 2 years ago in [place] in response to the rash on his legs he saw a dermatologist who told him he had a condition which may be pyoderma gangrenosum although I may be putting words into his mouth. He seemed to recognize this. On further questioning he admits to a 5 year history of quiesced. ulcerative colitis. He is not in any treatment for this. He's had no recent travel 12/11/16; the patient arrives today with his wound  and roughly the same condition we've been using silver alginate this is a deep punched out wound with some surrounding erythema but no tenderness. Biopsy I did did not show confirmed pyoderma gangrenosum suggested nonspecific inflammation and vasculitis but does not provide an actual description of what was seen by the pathologist. I'm really not able to understand this We have also received information from the patient's dermatologist in [place] notes from April 2016. This was a doctor [name]. 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 He was prescribed topical steroid high potency under occlusion which helped but at this point the patient did not have a deep punched out wound. 12/18/16; the patient's wound is larger in terms of surface area however this surface looks better and there is less depth. The surrounding erythema also is better. The patient states that the wrap we put on came off 2 days ago when he has been using his compression stockings. He we are in the process of getting a dermatology consult. 12/26/16 on evaluation today patient's left lower extremity wound shows evidence of infection with surrounding erythema noted. He has been tolerating the dressing changes but states that he has noted more discomfort. There is a larger area of erythema surrounding the wound. No fevers, chills, nausea, or vomiting noted at this time. With that being said the wound still does have slough covering the surface. He is not allergic to any medication that he is aware of at this point. In regard to his right lower extremity he had several regions that are erythematous and pruritic he wonders if there's anything we can do to help that. 01/02/17 I reviewed patient's wound culture which was obtained his visit last week. He was placed on doxycycline at that point. Unfortunately that does not appear to be an antibiotic that would likely help with the situation however the pseudomonas noted on  culture is sensitive to Cipro. Also unfortunately patient's wound seems to have a large compared to last week's evaluation. Not severely so but there are definitely increased measurements in general. He is continuing to have discomfort as well he writes this to be a seven out of 10. In fact he would prefer me not to perform any debridement today due to the fact that he is having discomfort  and considering he has an active infection on the little reluctant to do so anyway. No fevers, chills, nausea, or vomiting noted at this time. 01/08/17; patient seems dermatology on September 5. I suspect dermatology will want the slides from the biopsy I did sent to their pathologist. I'm not sure if there is a way we can expedite that. In any case the culture I did before I left on vacation 3 weeks ago showed Pseudomonas he was given 10 days of Cipro and per her description of her intake nurses is actually somewhat better this week although the wound is quite a bit bigger than I remember the last time I saw this. He still has 3 more days of Cipro 01/21/17; dermatology appointment tomorrow. He has completed the ciprofloxacin for Pseudomonas. Surface of the wound looks better however he is had some deterioration in the lesions on his right leg. Meantime the left lateral leg wound we will continue with sample 01/29/17; patient had his dermatology appointment but I can't yet see that note. He is completed his antibiotics. The wound is more superficial but considerably larger in circumferential area than when he came in. This is in his left lateral calf. He also has swollen erythematous areas with superficial wounds on the right leg and small papular areas on both arms. There apparently areas in her his upper thighs and buttocks I did not look at those. Dermatology biopsied the right leg. Hopefully will have their input next week. 02/05/17; patient went back to see his dermatologist who told him that he had a "scratching  problem" as well as staph. He is now on a 30 day course of doxycycline and I believe she gave him triamcinolone cream to the right leg areas to help with the itching [not exactly sure but probably triamcinolone]. She apparently looked at the left lateral leg wound although this was not rebiopsied and I think felt to be ultimately part of the same pathogenesis. He is using sample border foam and changing nevus himself. He now has a new open area on the right posterior leg which was his biopsy site I don't have any of the dermatology notes 02/12/17; we put the patient in compression last week with SANTYL to the wound on the left leg and the biopsy. Edema is much better and the depth of the wound is now at level of skin. Area is still the same Biopsy site on the right lateral leg we've also been using santyl with a border foam dressing and he is changing this himself. 02/19/17; Using silver alginate started last week to both the substantial left leg wound and the biopsy site on the right wound. He is tolerating compression well. Has a an appointment with his primary M.D. tomorrow wondering about diuretics although I'm wondering if the edema problem is actually lymphedema 02/26/17; the patient has been to see his primary doctor Dr. Jerrel Ivory at [place] our primary care. She started him on Lasix 20 mg and this seems to have helped with the edema. However we are not making substantial change with the left lateral calf wound and inflammation. The biopsy site on the right leg also looks stable but not really all that different. 03/12/17; the patient has been to see vein and vascular Dr. Lucky Cowboy. He has had venous reflux studies I have not reviewed these. I did get a call from his dermatology office. They felt that he might have pathergy based on their biopsy on his right leg which led them to  look at the slides of DEMETRIAS, TWOREK ([id number]) the biopsy I did on the left leg and they wonder whether this  represents pyoderma gangrenosum which was the original supposition in a man with ulcerative colitis albeit inactive for many years. They therefore recommended clobetasol and tetracycline i.e. aggressive treatment for possible pyoderma gangrenosum. 03/26/17; apparently the patient just had reflux studies not an appointment with Dr. dew. She arrives in clinic today having applied clobetasol for 2-3 weeks. He notes over the last 2-3 days excessive drainage having to change the dressing 3-4 times a day and also expanding erythema. 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 Lillia Carmel at [facility] dermatology locally who kindly saw him at my request. A repeat biopsy apparently has confirmed pyoderma gangrenosum and he started on prednisone 60 mg yesterday. My concern was the degree of erythema medially extending from his left leg wound which was either inflammation from pyoderma or cellulitis. I put him on Augmentin however culture of the wound showed Pseudomonas which is quinolone sensitive. I really don't believe he has cellulitis however in view of everything I will continue and give him a course of Cipro. He is also on doxycycline as an immune modulator for the pyoderma. In addition to his original wound on the left lateral leg with surrounding erythema he has a wound on the right posterior calf which was an original biopsy site done by dermatology. This was felt to represent pathergy from pyoderma gangrenosum 04/16/17; pyoderma gangrenosum. Saw Dr. Lillia Carmel yesterday. He has been using topical antibiotics to both wound areas his original wound on the left and the biopsies/pathergy area on the right. There is definitely some improvement in the inflammation  around the wound on the right although the patient states he has increasing sensitivity of the wounds. He is on prednisone 60 and doxycycline 1 as prescribed by Dr. Lillia Carmel. He is covering the topical antibiotic with gauze and putting this in his own compression stocks and changing this daily. He states that Dr. Lottie Rater did a culture of the left leg wound yesterday 05/07/17; pyoderma gangrenosum. The patient saw Dr. Lillia Carmel yesterday and has a follow-up with her in one month. He is still using topical antibiotics to both wounds although he can't recall exactly what type. He is still on prednisone 60 mg. Dr. Lillia Carmel stated that the doxycycline could stop if we were in agreement. 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 The right medial leg was induced by biopsy/pathergy. The area on the right is essentially healed. 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 The right medial leg has closed and remains closed. He is still on prednisone 60. He tells me he missed his last dermatology appointment with Dr. Lillia Carmel but will make another appointment. He reports that her blood sugar at a recent screen in [place] was high 200's. He was 180 today. 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 I think he is going to require still much longer prednisone perhaps another 3 months before attempting to taper. In the meantime his wound is a lot better. Smaller.  He is cleaning this off daily and applying topical antibiotics. When he was last in the clinic I thought about changing to [facility] and actually put in a couple of calls to dermatology although probably not during their business hours. In any case the wound looks better smaller I don't think there is any need to change what he is doing 08/06/17-he is  here in follow up evaluation for pyoderma left leg ulcer. He continues on oral prednisone. He has been using triple antibiotic ointment. There is surface debris and we will transition to [facility] and have him return in 2 weeks. He has lost 30 pounds since his last appointment with lifestyle modification. He may benefit from topical steroid cream for treatment this can be considered at a later date. 08/22/17 on evaluation today patient appears to actually be doing rather well in regard to his left lateral lower extremity ulcer. He has actually been managed by Dr. Dellia Nims most recently. Patient is currently on oral steroids at this time. This seems to have been of benefit for him. Nonetheless his last visit was actually with Leah on 08/06/17. Currently he is not utilizing any topical steroid creams although this could be of benefit as well. No fevers, chills, nausea, or vomiting noted at this time. 09/05/17 on evaluation today patient appears to be doing better in regard to his left lateral lower extremity ulcer. He has been tolerating the dressing changes without complication. He is using Santyl with good effect. Overall I'm very pleased with how things are standing at this point. Patient likewise is happy that this is doing better. 09/19/17 on evaluation today patient actually appears to be doing rather well in regard to his left lateral lower extremity ulcer. 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 He's not having any significant pain. 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 He has been tolerating the Santyl without complication and in general I feel like he's making good progress. 10/17/17 on evaluation today patient appears to be doing very well in regard to his left lateral lower surety ulcer. He has been tolerating  the dressing changes without complication. There does not appear to be any evidence of infection he's alternating the Santyl and the triple antibiotic ointment every other day this seems to be doing well for him. 11/03/17 on evaluation today patient appears to be doing very well in regard to his left lateral lower extremity ulcer. He is been tolerating the dressing changes without complication which is good news. Fortunately there does not appear to be any evidence of infection which is also great news. Overall is doing excellent they are starting to taper down on the prednisone is down to 40 mg at this point it also started topical clobetasol for him. 11/17/17 on evaluation today patient appears to be doing well in regard to his left lateral lower surety ulcer. He's been tolerating the dressing changes without complication. He does note that he is having no pain, no excessive drainage or discharge, and overall he feels like things are going about how he would expect and hope they would. Overall he seems to have no evidence of infection at this time in my opinion which is good news. 12/04/17-He is seen in follow-up evaluation for right lateral lower extremity ulcer. He has been applying topical steroid cream. Today's measurement show  slight increase in size. Over the next 2 weeks we will transition to every other day Santyl and steroid cream. He has been encouraged to monitor for changes and notify clinic with any concerns 12/15/17 on evaluation today patient's left lateral motion the ulcer and fortunately is doing worse again at this point. This just since last week to this week has close to doubled in size according to the patient. I did not seeing last week's I do not have a visual to compare this to in our system was also down so we do not have all the charts and at this point. Nonetheless it does have me somewhat concerned in regard to the fact that again he was worried enough about it he has  contact the dermatology that placed them back on the full strength, 50 mg a day of the prednisone that he was taken previous. He continues to alternate using clobetasol along with Santyl at this point. He is obviously somewhat frustrated. 12/22/17 on evaluation today patient appears to be doing a little worse compared to last evaluation. Unfortunately the wound is a little deeper and slightly larger than the last week's evaluation. With that being said he has made some progress in regard to the irritation surrounding at this time unfortunately despite that progress that's been made he still has a significant issue going on here. 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 CHAPLIN, CHERENFANT ([id number]) For that reason I discussed with him today the possibility of perform a wound culture to ensure there's nothing overtly infected. 01/06/18 on evaluation today patient's wound is larger and deeper than previously evaluated. With that being said it did appear that his wound was infected after my last evaluation with him. Subsequently I did end up prescribing a prescription for Bactrim DS which she has been taking and having no complication with. Fortunately there does not appear to be any evidence of infection at this point in time as far as anything spreading, no want to touch, and overall I feel like things are showing signs of improvement. 01/13/18 on evaluation today patient appears to be even a little larger and deeper than last time. There still muscle exposed in the base of the wound. Nonetheless he does appear to be less erythematous I do believe inflammation is calming down also believe the infection looks like it's probably resolved at this time based on what I'm seeing. No fevers, chills, nausea, or vomiting noted at this time. 01/30/18 on evaluation today  patient actually appears to visually look better for the most part. Unfortunately those visually this looks better he does seem to potentially have what may be an abscess in the muscle that has been noted in the central portion of the wound. This is the first time that I have noted what appears to be fluctuance in the central portion of the muscle. With that being said I'm somewhat more concerned about the fact that this might indicate an abscess formation at this location. I do believe that an ultrasound would be appropriate. This is likely something we need to try to do as soon as possible. He has been switch to mupirocin ointment and he is no longer using the steroid ointment as prescribed by dermatology he sees them again next week he's been decreased from 60 to 40 mg of prednisone. 03/09/18 on evaluation today patient actually appears to  be doing a little better compared to last time I saw him. There's not as much erythema surrounding the wound itself. He I did review his most recent infectious disease note which was dated 02/24/18. He saw Dr. Michel Bickers in [place]. 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 Patient is now off of antibiotics and they are continually observing this. There seems to be no change in the past two weeks in my pinion based on what the patient says and what I see today compared to what Dr. Megan Salon likely saw two weeks ago. No fevers, chills, nausea, or vomiting noted at this time. 03/23/18 on evaluation today patient's wound actually appears to be showing signs of improvement which is good news. He is currently still on the Dapsone. He is also working on tapering the prednisone to get off of this and Dr. Lottie Rater is working with him in this regard. Nonetheless overall I feel like the wound is doing well it does appear based on the infectious disease note that I reviewed from Dr.  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 I did also review the results of his ultrasound of left lower extremity which revealed there was a dentist tissue in the base of the wound without an abscess noted. 04/06/18 on evaluation today the patient's left lateral lower extremity ulcer actually appears to be doing fairly well which is excellent news. There does not appear to be any evidence of infection at this time which is also great news. Overall he still does have a significantly large ulceration although little by little he seems to be making progress. He is down to 10 mg a day of the prednisone. 04/20/18 on evaluation today patient actually appears to be doing excellent at this time in regard to his left lower extremity ulcer. He's making signs of good progress unfortunately this is taking much longer than we would really like to see but nonetheless he is making progress. Fortunately there does not appear to be any evidence of infection at this time. No fevers, chills, nausea, or vomiting noted at this time. The patient has not been using the Santyl due to the cost he hadn't got in this field yet. He's mainly been using the antibiotic ointment topically. Subsequently he also tells me that he really has not been scrubbing in the shower I think this would be helpful again as I told him it doesn't have to be anything too aggressive to even make it believe just enough to keep it free of some of the loose slough and biofilm on the wound surface. 05/11/18 on evaluation today patient's wound appears to be making slow but sure progress in regard to the left lateral lower extremity ulcer. He is been tolerating the dressing changes without complication. Fortunately there does not appear to be any evidence of infection at this time. He is still just using triple antibiotic ointment along with  clobetasol occasionally over the area. He never got the Santyl and really does not seem to intend to in my pinion. 06/01/18 on evaluation today patient appears to be doing a little better in regard to his left lateral lower extremity ulcer. He states that overall he does not feel like he is doing as well with the Dapsone as he did with  the prednisone. Nonetheless he sees his dermatologist later today and is gonna talk to them about the possibility of going back on the prednisone. Overall again I believe that the wound would be better if you would utilize Santyl but he really does not seem to be interested in going back to the [place] at this point. He has been using triple antibiotic ointment. 06/15/18 on evaluation today patient's wound actually appears to be doing about the same at this point. Fortunately there is no signs of infection at this time. He has made slight improvements although he continues to not really want to clean the wound bed at this point. He states that he just doesn't mess with it he doesn't want to cause any problems with everything else he has going on. He has been on medication, antibiotics as prescribed by his dermatologist, for a staff infection of his lower extremities which is really drying out now and looking much better he tells me. Fortunately there is no sign of overall infection. 06/29/18 on evaluation today patient appears to be doing well in regard to his left lateral lower surety ulcer all things considering. Fortunately his staff infection seems to be greatly improved compared to previous. He has no signs of infection and this is drying up quite nicely. He is still the doxycycline for this is no longer on cental, Dapsone, or any of the other medications. His dermatologist has recommended possibility of an infusion but right now he does not want to proceed with that. 07/13/18 on evaluation today patient appears to be doing about the same in regard to his left lateral  lower surety ulcer. Fortunately there's no signs of infection at this time which is great news. Unfortunately he still builds up a significant amount of Slough/biofilm of the surface of the wound he still is not really cleaning this as he should be appropriately. Again I'm able to easily with saline and gauze remove the majority of this on the surface which if you would do this at home would likely be a dramatic improvement for him as far as getting the area to improve. Nonetheless overall I still feel like he is making progress is just very slow. I think Santyl will be of benefit for him as well. Still he has not gotten this as of this point. 07/27/18 on evaluation today patient actually appears to be doing little worse in regards of the erythema around the periwound region of the wound he also tells me that he's been having more drainage currently compared to what he was experiencing last time I saw him. He states not quite as bad as what he had because this was infected previously but nonetheless is still appears to be doing poorly. Fortunately there is no evidence of systemic infection at this point. The patient tells me that he is not going to be able to afford the Santyl. He is still waiting to hear about the infusion therapy with his dermatologist. Apparently she wants an updated colonoscopy first. 08/10/18 on evaluation today patient appears to be doing better in regard to his left lateral lower extremity ulcer. Fortunately he is showing signs of improvement in this regard he's actually been approved for Remicade infusion's as well although this has not been scheduled as of yet. Fortunately there's no signs of active infection at this time in regard to the wound although he is having some issues with infection of the right lower extremity is been seen as dermatologist for this. Fortunately they are  definitely still working with him trying to keep things under control. RENE, LARDY  ([id number]) 09/07/18 on evaluation today patient is actually doing rather well in regard to his left lateral lower extremity ulcer. He notes these actually having some hair grow back on his extremity which is something he has not seen in years. He also tells me that the pain is really not giving them any trouble at this time which is also good news overall she is very pleased with the progress he's using a combination of the mupirocin along with the probate is all mixed. 09/21/18 on evaluation today patient actually appears to be doing fairly well all things considered in regard to his looks from the ulcer. He's been tolerating the dressing changes without complication. Fortunately there's no signs of active infection at this time which is good news he is still on all antibiotics or prevention of the staff infection. He has been on prednisone for time although he states it is gonna contact his dermatologist and see if she put them on a short course due to some irritation that he has going on currently. Fortunately there's no evidence of any overall worsening this is going very slow I think cental would be something that would be helpful for him although he states that $50 for tube is quite expensive. He therefore is not willing to get that at this point. 10/06/18 on evaluation today patient actually appears to be doing decently well in regard to his left lateral leg ulcer. He's been tolerating the dressing changes without complication. Fortunately there's no signs of active infection at this time. Overall I'm actually rather pleased with the progress he's making although it's slow he doesn't show any signs of infection and he does seem to be making some improvement. I do believe that he may need a switch up and dressings to try to help this to heal more appropriately and quickly. 10/19/18 on evaluation today patient actually appears to be doing better in regard to his left lateral lower extremity ulcer.  This is shown signs of having much less Slough buildup at this point due to the fact he has been using the [employer]. Obviously this is very good news. The overall size of the wound is not dramatically smaller but again the appearance is. 11/02/18 on evaluation today patient actually appears to be doing quite well in regard to his lower [place] ulcer. A lot of the skin around the ulcer is actually somewhat irritating at this point this seems to be more due to the dressing causing irritation from the adhesive that anything else. Fortunately there is no signs of active infection at this time. 11/24/18 on evaluation today patient appears to be doing a little worse in regard to his overall appearance of his lower extremity ulcer. There's more erythema and warmth around the wound unfortunately. He is currently on doxycycline which he has been on for some time. With that being said I'm not sure that seems to be helping with what appears to possibly be an acute cellulitis with regard to his left lower extremity ulcer. No fevers, chills, nausea, or vomiting noted at this time. 12/08/18 on evaluation today patient's wounds actually appears to be doing significantly better compared to his last evaluation. He has been using Santyl along with alternating tripling about appointment as well as the steroid cream seems to be doing quite well and the wound is showing signs of improvement which is excellent news. Fortunately there's no evidence of infection and in  fact his culture came back negative with only normal skin flora noted. 12/21/2018 upon evaluation today patient actually appears to be doing excellent with regard to his ulcer. This is actually the best that I have seen it since have been helping to take care of him. It is both smaller as well as less slough noted on the surface of the wound and seems to be showing signs of good improvement with new skin growing from the edges. He has been using just the  triamcinolone he does wonder if he can get a refill of that ointment today. 01/04/2019 upon evaluation today patient actually appears to be doing well with regard to his left lateral lower extremity ulcer. With that being said it does not appear to be that he is doing quite as well as last time as far as progression is concerned. There does not appear to be any signs of infection or significant irritation which is good news. With that being said I do believe that he may benefit from switching to a collagen based dressing based on how clean The wound appears. 01/18/2019 on evaluation today patient actually appears to be doing well with regard to his wound on the left lower extremity. He is not made a lot of progress compared to where we were previous but nonetheless does seem to be doing okay at this time which is good news. There is no signs of active infection which is also good news. My only concern currently is I do wish we can get him into utilizing the collagen dressing his insurance would not pay for the supplies that we ordered although it appears that he may be able to order this through his supply company that he typically utilizes. This is [employer]. Nonetheless he did try to order it during the office visit today and it appears this did go through. We will see if he can get that it is a different brand but nonetheless he has collagen and I do think will be beneficial. 02/01/2019 on evaluation today patient actually appears to be doing a little worse today in regard to the overall size of his wounds. Fortunately there is no signs of active infection at this time. That is visually. Nonetheless when this is happened before it was due to infection. For that reason were somewhat concerned about that this time as well. 02/08/2019 on evaluation today patient unfortunately appears to be doing slightly worse with regard to his wound upon evaluation today. Is measuring a little deeper and a little larger  unfortunately. I am not really sure exactly what is causing this to enlarge he actually did see his dermatologist she is going to see about initiating Humira for him. Subsequently she also did do steroid injections into the wound itself in the periphery. Nonetheless still nonetheless he seems to be getting a little bit larger he is gone back to just using the steroid cream topically which I think is appropriate. I would say hold off on the collagen for the time being is definitely a good thing to do. Based on the culture results which we finally did get the final result back regarding it shows staph as the bacteria noted again that can be a normal skin bacteria based on the fact however he is having increased drainage and worsening of the wound measurement wise I would go ahead and place him on an antibiotic today I do believe for this. 02/15/2019 on evaluation today patient actually appears to be doing somewhat better in  regard to his ulcer. There is no signs of worsening at this time I did review his culture results which showed evidence of Staphylococcus aureus but not MRSA. Again this could just be more related to the normal skin bacteria although he states the drainage has slowed down quite a bit he may have had a mild infection not just colonization. And was much smaller and then since around10/04/2019 on evaluation today patient appears to be doing unfortunately worse as far as the size of the wound. I really feel like that this is steadily getting larger again it had been doing excellent right at the beginning of September we have seen a steady increase in the area of the wound it is almost 2-1/2 times the size it was on September 1. Obviously this is a bad trend this is not wanting to see. For that reason we went back to using just the topical triamcinolone cream which does seem to help with inflammation. I checked him for bacteria by way of culture and nothing showed positive there. I am  considering giving him a short course of a tapering steroid Dosepak today to see if that is can be beneficial for him. The patient is in agreement with giving that a try. 03/08/2019 on evaluation today patient appears to be doing very well in comparison to last evaluation with regard to his lower extremity ulcer. This is showing signs of less inflammation and actually measuring slightly smaller compared to last time every other week over the past month and a half he has been measuring larger larger larger. Nonetheless I do believe that the issue has been inflammation the prednisone does seem to [facility], [place] ([id number]) have been beneficial for him which is good news. No fevers, chills, nausea, vomiting, or diarrhea. 03/22/2019 on evaluation today patient appears to be doing about the same with regard to his leg ulcer. He has been tolerating the dressing changes without complication. With that being said the wound seems to be mostly arrested at its current size but really is not making any progress except for when we prescribed the prednisone. He did show some signs of dropping as far as the overall size of the wound during that interval week. Nonetheless this is something he is not on long-term at this point and unfortunately I think he is getting need either this or else the Humira which his dermatologist has discussed try to get approval for. With that being said he will be seeing his dermatologist on the 11th of this month that is November. 04/19/2019 on evaluation today patient appears to be doing really about the same the wound is measuring slightly larger compared to last time I saw him. He has not been into the office since November 2 due to the fact that he unfortunately had Covid as that his entire family. He tells me that it was rough but they did pull-through and he seems to be doing much better. Fortunately there is no signs of active infection at this time. No fevers, chills, nausea,  vomiting, or diarrhea. 05/10/2019 on evaluation today patient unfortunately appears to be doing significantly worse as compared to last time I saw him. He does tell me that he has had his first dose of Humira and actually is scheduled to get the next one in the upcoming week. With that being said he tells me also that in the past several days he has been having a lot of issues with green drainage she showed me a picture this  is more blue-green in color. He is also been having issues with increased sloughy buildup and the wound does appear to be larger today. Obviously this is not the direction that we want everything to take based on the starting of his Humira. Nonetheless I think this is definitely a result of likely infection and to be honest I think this is probably Pseudomonas causing the infection based on what I am seeing. 05/24/2019 on evaluation today patient unfortunately appears to be doing significantly worse compared to his prior evaluation with me 2 weeks ago. I did review his culture results which showed that he does have Staph aureus as well as Pseudomonas noted on the culture. Nonetheless the Levaquin that I prescribed for him does not appear to have been appropriate and in fact he tells me he is no longer experiencing the green drainage and discharge that he had at the last visit. Fortunately there is no signs of active infection at this time which is good news although the wound has significantly worsened it in fact is much deeper than it was previous. We have been utilizing up to this point triamcinolone ointment as the prescription topical of choice but at this time I really feel like that the wound is getting need to be packed in order to appropriately manage this due to the deeper nature of the wound. Therefore something along the lines of an alginate dressing may be more appropriate. 05/31/2019 upon inspection today patient's wound actually showed signs of doing poorly at this  point. Unfortunately he just does not seem to be making any good progress despite what we have tried. He actually did go ahead and pick up the Cipro and start taking that as he was noticing more green drainage he had previously completed the Levaquin that I prescribed for him as well. Nonetheless he missed his appointment for the seventh last week on Wednesday with the wound care center and [facility] where his dermatologist referred him. Obviously I do think a second opinion would be helpful at this point especially in light of the fact that the patient seems to be doing so poorly despite the fact that we have tried everything that I really know how at this point. The only thing that ever seems to have helped him in the past is when he was on high doses of continual steroids that did seem to make a difference for him. 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 06/07/2019 upon evaluation today patient unfortunately appears to be doing worse yet again with regard to his wound. In fact I am starting to question whether or not he may have a fluid pocket in the muscle at this point based on the bulging and the soft appearance to the central portion of the muscle area. There is not anything draining from the muscle itself at this time which is good news but nonetheless the wound is expanding. I am not really seeing any results of the Humira as far as overall wound progression based on what I am seeing at this point. The patient has been referred for second opinion with regard to his wound to the [facility] wound care center by his dermatologist which I definitely am not in opposition to. Unfortunately we tried multiple dressings in the past including collagen, alginate, and  at one point even [facility]. With that being said he is never really used it  for any significant amount of time due to the fact that he often complains of pain associated with these dressings and then will go back to either using the Santyl which she has done intermittently or more frequently the triamcinolone. He is also using his own compression stockings. We have wrapped him in the past but again that was something else that he really was not a big fan of. 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 He seemed to do much better when he was on the steroids although honestly I understand that the steroids are not necessarily the best medication to be on long-term obviously 06/14/2019 on evaluation today patient appears to be doing actually a little bit better with regard to the overall appearance with his leg. Unfortunately he does continue to have issues with what appears to be some fluid underneath the muscle although he did see the wound specialty center at [facility] last week their main goals were to see about infusion therapy in place of the Humira as they feel like that is not quite strong enough. They also recommended that we continue with the treatment otherwise as we are they felt like that was appropriate and they are okay with him continuing to follow-up here with [place] in that regard. With that being said they are also sending him to the vein specialist there to see about vein stripping and if that would be of benefit for him. Subsequently they also did not really address whether or not an ultrasound of the muscle area to see if there is anything that needs to be addressed here would be appropriate or not. For that reason I discussed this with him last week  I think we may proceed down that road at this point. 06/21/2019 upon evaluation today patient's wound actually appears to be doing slightly better compared to previous evaluations. I do believe that he has made a difference with regard to the progression here with the use of oral steroids. Again in the past has been the only thing that is really calm things down. He does tell me that from [facility] is gotten a good news from there that there are no further vein stripping that is necessary at this point. I do not have that available for review today although the patient did relay this to me. He also did obtain and have the ultrasound of the wound completed which I did sign off on today. It does appear that there is no fluid collection under the muscle this is likely then just edematous tissue in general. That is also good news. Overall I still believe the inflammation is the main issue here. He did inquire about the possibility of a wound VAC again with the muscle protruding like it is I am not really sure whether the wound VAC is necessarily ideal or not. That is something we will have to consider although I do believe he may need compression wrapping to try to help with edema control which could potentially be of benefit. 06/28/2019 on evaluation today patient appears to  be doing slightly better measurement wise although this is not terribly smaller he least seems to be trending towards that direction. With that being said he still seems to have purulent drainage noted in the wound bed at this time. He has been on Levaquin followed by Cipro over the past month. Unfortunately he still seems to have some issues with active infection at this time. I did perform a culture last week in order to evaluate and see if indeed there was still anything going on. Subsequently the culture did come back showing Pseudomonas which is consistent with the drainage has been having which is blue-green in color. He also has had an  odor that again was somewhat consistent with Pseudomonas as well. Long story short it appears that the culture showed an intermediate finding with regard to how well the Cipro will work for the Pseudomonas infection. Subsequently being that he does not seem to be clearing up and at best what we are doing is just keeping this at [place] I think he may need to see infectious disease to discuss IV antibiotic options. TALEB, CARMICAL ([id number]) 07/05/2019 upon evaluation today patient appears to be doing okay in regard to his leg ulcer. He has been tolerating the dressing changes at this point without complication. Fortunately there is no signs of active infection at this time which is good news. No fevers, chills, nausea, vomiting, or diarrhea. With that being said he does have an appointment with infectious disease tomorrow and his primary care on Wednesday. Again the reason for the infectious disease referral was due to the fact that he did not seem to be fully resolving with the use of oral antibiotics and therefore we were thinking that IV antibiotic therapy may be necessary secondary to the fact that there was an intermediate finding for how effective the Cipro may be. Nonetheless again he has been having a lot of purulent and even green drainage. Fortunately right now that seems to have calmed down over the past week with the reinitiation of the oral antibiotic. Nonetheless we will see what Dr. Megan Salon has to say. 07/12/2019 upon evaluation today patient appears to be doing about the same at this point in regard to his left lower extremity ulcer. Fortunately there is no signs of active infection at this time which is good news I do believe the Levaquin has been beneficial I did review Dr. Hale Bogus note and to be honest I agree that the patient's leg does appear to be doing better currently. What we found in the past as he does not seem to really completely resolve he will stop the antibiotic and then  subsequently things will revert back to having issues with blue-green drainage, increased pain, and overall worsening in general. Obviously that is the reason I sent him back to infectious disease. 07/19/2019 upon evaluation today patient appears to be doing roughly the same in size there is really no dramatic improvement. He has started back on the Levaquin at this point and though he seems to be doing okay he did still have a lot of blue/green drainage noted on evaluation today unfortunately. I think that this is still indicative more likely of a Pseudomonas infection as previously noted and again he does see Dr. Megan Salon in just a couple of days. I do not know that were really able to effectively clear this with just oral antibiotics alone based on what I am seeing currently. Nonetheless we are still continue to try to manage as best we  can with regard to the patient and his wound. I do think the wrap was helpful in decreasing the edema which is excellent news. No fevers, chills, nausea, vomiting, or diarrhea. 07/26/2019 upon evaluation today patient appears to be doing slightly better with regard to the overall appearance of the muscle there is no dark discoloration centrally. Fortunately there is no signs of active infection at this time. No fevers, chills, nausea, vomiting, or diarrhea. Patient's wound bed currently the patient did have an appointment with Dr. Megan Salon at infectious disease last week. With that being said Dr. Megan Salon the patient states was still somewhat hesitant about put him on any IV antibiotics he wanted [place] to repeat cultures today and then see where things go going forward. He does look like Dr. Megan Salon because of some improvement the patient did have with the Levaquin wanted [place] to see about repeating cultures. If it indeed grows the Pseudomonas again then he recommended a possibility of considering a PICC line placement and IV antibiotic therapy. He plans to see the patient  back in 1 to 2 weeks. 08/02/2019 upon evaluation today patient appears to be doing poorly with regard to his left lower extremity. We did get the results of his culture back it shows that he is still showing evidence of Pseudomonas which is consistent with the purulent/blue-green drainage that he has currently. Subsequently the culture also shows that he now is showing resistance to the oral fluoroquinolones which is unfortunate as that was really the only thing to treat the infection prior. I do believe that he is looking like this is going require IV antibiotic therapy to get this under control. Fortunately there is no signs of systemic infection at this time which is good news. The patient does see Dr. Megan Salon tomorrow. 08/09/2019 upon evaluation today patient appears to be doing better with regard to his left lower extremity ulcer in regard to the overall appearance. He is currently on IV antibiotic therapy. As ordered by Dr. Megan Salon. Currently the patient is on ceftazidime which she is going to take for the next 2 weeks and then follow-up for 4 to 5-week appointment with Dr. Megan Salon. The patient started this this past Friday symptoms have not for a total of 3 days currently in full. 08/16/2019 upon evaluation today patient's wound actually does show muscle in the base of the wound but in general does appear to be much better as far as the overall evidence of infection is concerned. In fact I feel like this is for the most part cleared up he still on the IV antibiotics he has not completed the full course yet but I think he is doing much better which is excellent news. 08/23/2019 upon evaluation today patient appears to be doing about the same with regard to his wound at this point. He tells me that he still has pain unfortunately. Fortunately there is no evidence of systemic infection at this time which is great news. There is significant muscle protrusion. 09/13/19 upon evaluation today patient  appears to be doing about the same in regard to his leg unfortunately. He still has a lot of drainage coming from the ulceration there is still muscle exposed. With that being said the patient's last wound culture still showed an intermediate finding with regard to the Pseudomonas he still having the bluish/green drainage as well. Overall I do not know that the wound has completely cleared of infection at this point. Fortunately there is no signs of active infection systemically at  this point which is good news. 09/20/2019 upon evaluation today patient's wound actually appears to be doing about the same based on what I am seeing currently. I do not see any signs of systemic infection he still does have evidence of some local infection and drainage. He did see Dr. Orvan Falconer last week and Dr. Orvan Falconer states that he probably does need a different IV antibiotic although he does not want to put him on this until the patient begins the Remicade infusion which is actually scheduled for about 10 days out from today on 13 May. Following that time Dr. Orvan Falconer is good to see him back and then will evaluate the feasibility of starting him on the IV antibiotic therapy once again at that point. I do not disagree with this plan I do believe as Dr. Orvan Falconer stated in his note that I reviewed today that the patient's issue is multifactorial with the pyoderma being 1 aspect of this that were hoping the Remicade will be helpful for her. In the meantime I think the gentamicin is, helping to keep things under decent okay control in regard to the ulcer. 09/27/2019 upon evaluation today patient appears to be doing about the same with regard to his wound still there is a lot of muscle exposure though he does have some hyper granulation tissue noted around the edge and actually some granulation tissue starting to form over the muscle which is actually good news. Fortunately there is no evidence of active infection which is also  good news. His pain is less at this point. 5/21; this is a patient I have not seen in a long time. 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 He has a large wound on the left lateral leg with protruding muscle. He comes in the clinic today showing the same area on his left medial ankle. He says there is been a spot there for some time although we have not previously defined this. Today he has a clearly defined area with slight amount of skin breakdown surrounded by raised areas with a purplish hue in color. This is not painful he says it is irritated. 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 He still has muscle protruding from the lateral portion of his left leg but fortunately the new area that they were concerned about at his last visit does not appear to have opened at this point. He is currently on Remicade infusions and seems to be doing better in my opinion in fact the wound itself seems to be overall much better. The purplish discoloration that he did have seems to have resolved and I think that is a good sign that hopefully the Remicade is doing its job. He does have some biofilm noted over the surface of the wound. 11/01/2019 on evaluation today patient's wound actually appears to be doing excellent at this time. Fortunately there is no evidence of active infection and overall I feel like he is making great progress. The Remicade seems to be due excellent job in my opinion. KIAM, TALFORD ([id number]) 11/08/19 evaluation today vision actually appears to be doing quite well with regard to his weight ulcer. He's been tolerating dressing changes without complication. Fortunately there is no evidence of infection. No fevers, chills, nausea, or vomiting noted at this time. Overall states that is having more itching than pain which is actually a good sign  in my opinion.  12/13/2019 upon evaluation today patient appears to be doing well today with regard to his wound. He has been tolerating the dressing changes without complication. Fortunately there is no sign of active infection at this time. No fevers, chills, nausea, vomiting, or diarrhea. Overall I feel like the infusion therapy has been very beneficial for him. 01/06/2020 on evaluation today patient appears to be doing well with regard to his wound. This is measuring smaller and actually looks to be doing better. Fortunately there is no signs of active infection at this point. No fevers, chills, nausea, vomiting, or diarrhea. With that being said he does still have the blue-green drainage but this does not seem to be causing any significant issues currently. He has been using the gentamicin that does seem to be keeping things under decent control at this point. He goes later this morning for his next infusion therapy for the pyoderma which seems to also be very beneficial. 02/07/2020 on evaluation today patient appears to be doing about the same in regard to his wounds currently. Fortunately there is no signs of active infection systemically he does still have evidence of local infection still using gentamicin. He also is showing some signs of improvement albeit slowly I do feel like we are making some progress here. 02/21/2020 upon evaluation today patient appears to be making some signs of improvement the wound is measuring a little bit smaller which is great news and overall I am very pleased with where he stands currently. He is going to be having infusion therapy treatment on the 15th of this month. Fortunately there is no signs of active infection at this time. 03/13/2020 I do believe patient's wound is actually showing some signs of improvement here which is great news. He has continue with the infusion therapy through rheumatology/dermatology at [facility]. That does seem to be beneficial. I still  think he gets as much benefit from this as he did from the prednisone initially but nonetheless obviously this is less harsh on his body that the prednisone as far as they are concerned. 03/31/2020 on evaluation today patient's wound actually showing signs of some pretty good improvement in regard to the overall appearance of the wound bed. There is still muscle exposed though he does have some epithelial growth around the edges of the wound. Fortunately there is no signs of active infection at this time. No fevers, chills, nausea, vomiting, or diarrhea. 04/24/2020 upon evaluation today patient appears to be doing about the same in regard to his leg ulcer. He has been tolerating the dressing changes without complication. Fortunately there is no signs of active infection at this time. No fevers, chills, nausea, vomiting, or diarrhea. With that being said he still has a lot of irritation from the bandaging around the edges of the wound. We did discuss today the possibility of a referral to plastic surgery. 05/22/2020 on evaluation today patient appears to be doing well with regard to his wounds all things considered. He has not been able to get the Chantix apparently there is a recall nurse that I was unaware of put out by [employer] involuntarily. Nonetheless for now I am and I have to do some research into what may be the best option for him to help with quitting in regard to smoking and we discussed that today. 06/26/2020 upon evaluation today patient appears to be doing well with regard to his wound from the standpoint of infection I do not see any signs of infection at this point. With  that being said unfortunately he is still continuing to have issues with muscle exposure and again he is not having a whole lot of new skin growth unfortunately. There does not appear to be any signs of active infection at this time. No fevers, chills, nausea, vomiting, or diarrhea. 07/10/2020 upon evaluation today patient  appears to be doing a little bit more poorly currently compared to where he was previous. I am concerned currently about an active infection that may be getting worse especially in light of the increased size and tenderness of the wound bed. No fevers, chills, nausea, vomiting, or diarrhea. 07/24/2020 upon evaluation today patient appears to be doing poorly in regard to his leg ulcer. He has been tolerating the dressing changes without complication but unfortunately is having a lot of discomfort. 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 Subsequently I think he is going require possibly IV antibiotics to get this under control. I am very concerned about the severity of his infection and the amount of discomfort he is having. 07/31/2020 upon evaluation today patient appears to be doing about the same in regard to his leg wound. He did see Dr. Megan Salon and Dr. Megan Salon is actually going to start him on IV antibiotics. He goes for the PICC line tomorrow. With that being said there do not have that run for 2 weeks and then see how things are doing and depending on how he is progressing they may extend that a little longer. Nonetheless I am glad this is getting ready to be in place and definitely feel it may help the patient. In the meantime is been using mainly triamcinolone to the wound bed has an anti-inflammatory. 08/07/2020 on evaluation today patient appears to be doing well with regard to his wound compared even last week. In the interim he has gotten the PICC line placed and overall this seems to be doing excellent. There does not appear to be any evidence of infection which is great news systemically although locally of course has had the infection this appears to be improving with the use of the antibiotics. 08/14/2020 upon evaluation today patient's wound actually showing signs of excellent improvement. Overall the irritation has  significantly improved the drainage is back down to more of a normal level and his pain is really pretty much nonexistent compared to what it was. Obviously I think that this is significantly improved secondary to the IV antibiotic therapy which has made all the difference in the world. Again he had a resistant form of Pseudomonas for which oral antibiotics just was not cutting it. Nonetheless I do think that still we need to consider the possibility of a surgical closure for this wound is been open so long and to be honest with muscle exposed I think this can be very hard to get this to close outside of this although definitely were still working to try to do what we can in that regard. 08/21/2020 upon evaluation today patient appears to be doing very well with regard to his wounds on the left lateral lower extremity/calf area. Fortunately there does not appear to be signs of active infection which is great news and overall very pleased with where things stand today. He is actually wrapping up his treatment with IV antibiotics tomorrow. After that we will see where things go from there. 08/28/2020 upon evaluation today patient appears to be doing decently well with regard to his leg ulcer. There does not appear  to be any signs of active infection which is great news and overall very pleased with where things stand today. No fevers, chills, nausea, vomiting, or diarrhea. 09/18/2020 upon evaluation today patient appears to be doing well with regard to his infection which I feel like is better. Unfortunately he is not doing as well with regard to the overall size of the wound which is not nearly as good at this point. I feel like that he may be having an issue here with the pyoderma being somewhat out of control. I think that he may benefit from potentially going back and talking to the dermatologist about GABRIAL, SCHELLIN ([id number]) what to do from the pyoderma standpoint. I am not certain if the infusions  are helping nearly as much is what the prednisone did in the past. 10/02/2020 upon evaluation today patient appears to be doing well with regard to his leg ulcer. He did go to the [facility]iatric nurse. Unfortunately they feel like there is a 10% chance that most that he would be able to heal and that the skin graft would take. Obviously this has led him to not be able to go down that path as far as treatment is concerned. Nonetheless he does seem to be doing a little bit better with the prednisone that I gave him last time. I think that he may need to discuss with dermatology the possibility of long-term prednisone as that seems to be what is most helpful for him to be perfectly honest. I am not sure the Remicade is really doing the job. 10/17/2020 upon evaluation today patient appears to be doing a little better in regard to his wound. In fact the case has been since we did the prednisone on May 2 for him that we have noticed a little bit of improvement each time we have seen a size wise as well as appearance wise as well as pain wise. I think the prednisone has had a greater effect then the infusion therapy has to be perfectly honest. With that being said the patient also feels significantly better compared to what he was previous. All of this is good news but nonetheless I am still concerned about the fact that again we are really not set up to long-term manage him as far as prednisone is concerned. Obviously there are things that you need to be watched I completely understand the risk of prednisone usage as well. 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 With all that being said I do believe that we can give him another round of the prednisone which she is requesting today because of the improvement that he seen since we did that first round. 10/30/2020 upon evaluation today patient's wound actually is showing signs of doing quite well. There does not appear  to be any evidence of infection which is great news and overall very pleased with where things stand today. No fevers, chills, nausea, vomiting, or diarrhea. He tells me that the prednisone still has seem to have helped he wonders if we can extend that for just a little bit longer. He did not have the appointment with a dermatologist although he did have an infusion appointment last Friday. That was at [facility]. With that being said he tells me he could not do both that as well as the appointment with the physician on the same day therefore that is can have to be rescheduled. I really want to see if there  is anything they feel like that could be done differently to try to help this out as I am not really certain that the infusions are helping significantly here. 11/13/2020 upon evaluation today patient unfortunately appears to be doing somewhat poorly in regard to his wound I feel like this is actually worsening from the standpoint of the pyoderma spreading. I still feel like that he may need something different as far as trying to manage this going forward. Again we did the prednisone unfortunately his blood sugars are not doing so well because of this. Nonetheless I believe that the patient likely needs to try topical steroid. We have done triamcinolone for a while I think going with something stronger such as clobetasol could be beneficial again this is not something I do lightly I discussed this with the patient that again this does not normally put underneath an occlusive dressing. Nonetheless I think a thin film as such could help with some of the stronger anti-inflammatory effects. We discussed this today. He would like to try to give this a trial for the next couple weeks. I definitely think that is something that we can do. Evaluate7/03/2021 and today patient's wound bed actually showed signs of doing really about the same. There was a little expansion of the size of the wound and that leading edge  that we done looking out although the clobetasol does seem to have slowed this down a bit in my opinion. There is just 1 small area that still seems to be progressing based on what I see. Nonetheless I am concerned about the fact this does not seem to be improving if anything seems to be doing a little bit worse. I do not know that the infusions are really helping him much as next infusion is August 5 his appointment with dermatology is July 25. Either way I really think that we need to have a conversation potentially about this and I am actually going to see if I can talk with Dr. Lillia Carmel in order to see where things stand as well. 12/11/2020 upon evaluation today patient appears to be doing worse in regard to his leg ulcer. Unfortunately I just do not think this is making the progress that I would like to see at this point. Honestly he does have an appointment with dermatology and this is in 2 days. I am wondering what they may have to offer to help with this. Right now what I am seeing is that he is continuing to show signs of worsening little by little. Obviously that is not great at all. Is the exact opposite of what we are looking for. 12/18/2020 upon evaluation today patient appears to be doing a little better in regard to his wound. The dermatologist actually did do some steroid injections into the wound which does seem to have been beneficial in my opinion. That was on the 25th already this looks a little better to me than last time I saw him. With that being said we did do a culture and this did show that he has Staph aureus noted in abundance in the wound. With that being said I do think that getting him on an oral antibiotic would be appropriate as well. Also think we can compression wrap and this will make a difference as well. 12/28/2020 upon evaluation today patient's wound is actually showing signs of doing much better. I do believe the compression wrap is helping he has a lot of  drainage but to be honest I  think that the compression is helping to some degree in this regard as well as not draining through which is also good news. No fevers, chills, nausea, vomiting, or diarrhea. 01/04/2021 upon evaluation today patient appears to be doing well with regard to his wound. Overall things seem to be doing quite well. He did have a little bit of reaction to the CarboFlex Sorbact he will be using that any longer. With that being said he is controlled as far as the drainage is concerned overall and seems to be doing quite well. I do not see any signs of active infection at this time which is great news. No fevers, chills, nausea, vomiting, or diarrhea. 01/11/2021 upon evaluation today patient appears to be doing well with regard to his wounds. He has been tolerating the dressing changes without complication. Fortunately there does not appear to be any signs of active infection at this time which is great news. Overall I am extremely pleased with where we stand currently. No fevers, chills, nausea, vomiting, or diarrhea. Where using clobetasol in the wound bed he has a lot of new skin growth which is awesome as well. 01/18/2021 upon evaluation today patient appears to be doing very well in regard to his leg ulcer. He has been tolerating the dressing changes without complication. Fortunately there does not appear to be any signs of active infection which is great news. In general I think that he is making excellent progress 01/25/2021 upon evaluation today patient appears to be doing well with regard to his wound on the leg. I am actually extremely pleased with where things stand today. There does not appear to be any signs of active infection which is great news and overall I think that we are definitely headed in the appropriate direction based on what I am seeing currently. There does not appear to be any signs of active infection also excellent news. 02/06/2021 upon evaluation today  patient appears to be doing well with regard to his wound. Overall visually this is showing signs of significant improvement which is great news. I do not see any signs of active infection systemically which is great even locally I do not think that we are seeing any major complications here. We did do fluorescence imaging with the [employer] DX today. The patient does have some odor and drainage noted and again this is something that I think would benefit him to probably come more frequently for nurse visits. 02/19/2021 upon evaluation today patient actually appears to be doing quite well in regard to his wound. He has been tolerating the dressing Hands, Curran ([id number]) changes without complication and overall I think that this is making excellent progress. I do not see any evidence of active infection at this point which is great news as well. No fevers, chills, nausea, vomiting, or diarrhea. 10/10; wound is made nice progress healthy granulation with a nice rim of epithelialization which seems to be expanding even from last week he has a deeper area in the inferior part of the more distal part of the wound with not quite as healthy as surface. This area will need to be followed. Using clobetasol and Hydrofera Blue 03/05/2021 upon evaluation today patient appears to be doing very well in regard to his leg ulcer. He has been tolerating dressing changes without complication. Fortunately there does not appear to be any signs of active infection which is great news and overall I am extremely pleased with where we stand currently. 03/12/2021 upon evaluation today patient  appears to be doing well with regard to his wound in fact this is extremely extremely good based on what we are seeing today there does not appear to be any signs of active infection and overall I think that he is doing awesome from the standpoint of healing in general. I am extremely pleased with how things seem to be progressing  with regard to this pyoderma. Clobetasol has done wonders for him. I think the compression wrapping has also been of great benefit. 03/19/2021 upon evaluation today patient appears to be doing well with regard to his wound. He is tolerating the dressing changes without complication. In fact I feel like that he is actually making excellent progress at this point based on what I am seeing. No fevers, chills, nausea, vomiting, or diarrhea. 03/26/2021 upon evaluation today patient appears to be doing well with regard to his wound. This again is measuring smaller and looking better. Again the progress is slow but nonetheless continual with what we have been seeing. I do believe that the current plan is doing awesome for him. 04/09/2021 upon evaluation today patient appears to be doing well with regard to his leg ulcer. This is showing signs of excellent improvement the muscle is completely closed over and there does not appear to be any evidence of inflammation at this point his drainage is significantly improved. Overall I think that he would be a good candidate for looking into a skin substitute at this point as well. We will get a look into some approvals in that regard. Potentially TheraSkin as well as Apligraf could both be considered just depending on insurance coverage. 04/16/2021 upon evaluation today patient appears to be doing well at this point. He has been approved for the Apligraf which we could definitely order although I would like to try to get the TheraSkin approved if at all possible. I did fax notes into them today and I Georgina Peer try to give a call as well. Overall the wound appears to be doing decently well today. 04/20/2021 upon evaluation today patient actually appears to be doing quite well in regard to his wound with that being said we are trying to see what we can do about speeding up the healing process. For that reason we did discuss the possibility of a skin substitute. We got the  Apligraf approved. For that reason we will get a go ahead and see what we can do with the Apligraf at this point. I am still trying to get the TheraSkin approved but I have not heard anything from the insurance company yet I have called and talked to them earlier in the week and to be honest this was about half an hour that I spent on the phone they told me I would hear something within 10-15 business days. 04/27/2021 upon evaluation today patient appears to be doing excellent in regard to his wound. I do believe the Apligraf has been beneficial. With that being said he has had a little bit of increased pain has been a little bit concerned. I do want to go ahead and apply his steroid cream today the clobetasol and then put the Apligraf over I just do not think I want to risk not putting the clobetasol on based on what is been going on here currently. Patient voiced understanding. 05/04/2021 upon evaluation today patient is making excellent improvement. Overall there is definitely decrease in the size of the wound today and I am very pleased in that regard. I do not  see any signs of active infection locally nor systemically at this point. No fevers, chills, nausea, vomiting, or diarrhea. 05/11/2021 upon evaluation today patient appears to be doing well with regard to his wound. He has been tolerating Apligraf's without complication and is making excellent progress this is application #4 today. 12/30; patient here for Apligraf application. Appears to be doing well small wound there has been a lot of healing 05/28/2021 upon evaluation today patient appears to be doing well with regard to his wound. Has been tolerating the dressing changes without complication. Fortunately I do not see any signs of active infection locally nor systemically at this point which is great news. He is done with the Apligraf and the first round and overall this has filled in quite nicely I am not even certain he needs any  additional at the moment. I would actually try to go back to clobetasol with [facility] which previously was doing well for him. 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 He is using clobetasol and Hydrofera Blue. Surface area of the wound is smaller and the surface looks very healthy. Objective Constitutional Sitting or standing Blood Pressure is within target range for patient.. Pulse regular and within target range for patient.Marland Kitchen Respirations regular, non- labored and within target range.. Temperature is normal and within the target range for the patient.Marland Kitchen appears in no distress. Vitals Time Taken: 8:15 AM, Height: 71 in, Weight: 338 lbs, BMI: 47.1, Temperature: 97.8 F, Pulse: 92 bpm, Respiratory Rate: 18 breaths/min, Blood Pressure: 133/83 mmHg. Cardiovascular Peugh, [place] ([id number]) Pedal pulses on the left there palpable. General Notes: Wound exam; left anterior lower leg. Very healthy looking wound no debridement is required. Healthy granulation surface area measurements are better. No evidence of surrounding infection Integumentary (Hair, Skin) Wound #1 status is Open. Original cause of wound was Gradually Appeared. The date acquired was: 11/18/2015. The wound has been in treatment 234 weeks. The wound is located on the Left,Lateral Lower Leg. The wound measures 2.3cm length x 1cm width x 0.1cm depth; 1.806cm^2 area and 0.181cm^3 volume. There is Fat Layer (Subcutaneous Tissue) exposed. There is no tunneling or undermining noted. There is a medium amount of serosanguineous drainage noted. The wound margin is flat and intact. There is large (67-100%) red, pink granulation within the wound bed. There is a small (1-33%) amount of necrotic tissue within the wound bed including Adherent Slough. 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 Continued improvement in the surface area and condition of the wound 2. No reason to change current dressing which is clobetasol and Hydrofera Blue 3. He has some degree of chronic venous insufficiency. As this gets closer to closing he will need 20/30 mm below-knee stockings. I had the nurses give him his leg measurements and the number for elastic therapy in  Electronic Signature(s) Signed: 06/05/2021 7:38:21 AM By: Linton Ham MD Entered By: Linton Ham on 06/04/2021 08:35:49 [name] ([id number]) -------------------------------------------------------------------------------- SuperBill Details Patient Name: [name] Date of Service: 06/04/2021 Medical Record Number: [id number] Patient Account Number: [id number] Date of Birth/Sex: December 03, 1980 (40 y.o. M) Treating RN: Levora Dredge Primary Care Provider: [name] Other Clinician: Referring Provider: [name] Treating Provider/Extender: Tito Dine in Treatment: 234 Diagnosis Coding ICD-10 Codes Code Description I87.2 Venous insufficiency (chronic) (peripheral)  [id number] Non-pressure chronic ulcer of left calf with fat layer exposed E11.622 Type 2 diabetes mellitus with other skin ulcer L88 Pyoderma gangrenosum F17.208 Nicotine dependence, unspecified, with other nicotine-induced disorders Facility Procedures CPT4 Code: [id number] Description: [phone] - WOUND CARE VISIT-LEV 2 EST PT Modifier: Quantity: 1 Physician Procedures CPT4 Code: [id number] Description: [id number] - WC PHYS LEVEL 3 - EST PT Modifier: Quantity: 1 CPT4 Code: Description: ICD-10 Diagnosis Description L97.222 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

## 2021-06-08 ENCOUNTER — Other Ambulatory Visit: Payer: Self-pay

## 2021-06-08 DIAGNOSIS — I872 Venous insufficiency (chronic) (peripheral): Secondary | ICD-10-CM | POA: Diagnosis not present

## 2021-06-08 NOTE — Progress Notes (Signed)
LUI, BELLIS ([id number]) Visit Report for 06/08/2021 Arrival Information Details Patient Name: [name], [name] Date of Service: 06/08/2021 8:00 AM Medical Record Number: [id number] Patient Account Number: [id number] Date of Birth/Sex: 01/04/1981 (41 y.o. M) Treating RN: Donnamarie Poag Primary Care Clemmie Marxen: [name] Other Clinician: Referring Kingsten Enfield: [name] Treating Anja Neuzil/Extender: Tito Dine in Treatment: 23 Visit Information History Since Last Visit Added or deleted any medications: No Patient Arrived: Ambulatory Had a fall or experienced change in No Arrival Time: 08:02 activities of daily living that may affect Accompanied By: self risk of falls: Transfer Assistance: None Hospitalized since last visit: No Patient Identification Verified: Yes Has Dressing in Place as Prescribed: Yes Secondary Verification Process Completed: Yes Has Compression in Place as Prescribed: Yes Patient Requires Transmission-Based No Pain Present Now: No Precautions: Patient Has Alerts: Yes Patient Alerts: Patient has reaction to silver dressings. Electronic Signature(s) Signed: 06/08/2021 11:21:01 AM By: Donnamarie Poag Entered By: Donnamarie Poag on 06/08/2021 08:04:09 [name] ([id number]) -------------------------------------------------------------------------------- Compression Therapy Details Patient Name: [name] Date of Service: 06/08/2021 8:00 AM Medical Record Number: [id number] Patient Account Number: [id number] Date of Birth/Sex: Oct 13, 1980 (40 y.o. M) Treating RN: Donnamarie Poag Primary Care Daylee Delahoz: [name] Other Clinician: Referring Maahir Horst: [name] Treating Antonieta Slaven/Extender: Tito Dine in Treatment: 235 Compression Therapy Performed for Wound Assessment: Wound #1 Left,Lateral Lower Leg Performed By: Junius Argyle, RN Compression Type: Four Layer Electronic Signature(s) Signed: 06/08/2021 11:21:01 AM By:  Donnamarie Poag Entered By: Donnamarie Poag on 06/08/2021 08:22:19 [name] ([id number]) -------------------------------------------------------------------------------- Encounter Discharge Information Details Patient Name: [name] Date of Service: 06/08/2021 8:00 AM Medical Record Number: [id number] Patient Account Number: [id number] Date of Birth/Sex: June 02, 1980 (40 y.o. M) Treating RN: Donnamarie Poag Primary Care Ariza [name]: [name] Other Clinician: Referring Vernell Back: [name] Treating Pat Elicker/Extender: Tito Dine in Treatment: 235 Encounter Discharge Information Items Discharge Condition: Stable Ambulatory Status: Ambulatory Discharge Destination: Home Transportation: Private Auto Accompanied By: self Schedule Follow-up Appointment: Yes Clinical Summary of Care: Electronic Signature(s) Signed: 06/08/2021 11:21:01 AM By: Donnamarie Poag Entered By: Donnamarie Poag on 06/08/2021 56:43:32 [name] ([id number]) -------------------------------------------------------------------------------- Wound Assessment Details Patient Name: [name] Date of Service: 06/08/2021 8:00 AM Medical Record Number: [id number] Patient Account Number: [id number] Date of Birth/Sex: Oct 05, 1980 (40 y.o. M) Treating RN: Donnamarie Poag Primary Care Aryannah Mohon: [name] Other Clinician: Referring Jessalyn Hinojosa: [name] Treating David Rodriquez/Extender: Tito Dine in Treatment: 235 Wound Status Wound Number: 1 Primary Etiology: Pyoderma Wound Location: Left, Lateral Lower Leg Wound Status: Open Wounding Event: Gradually Appeared Comorbid History: Sleep Apnea, Hypertension, Colitis Date Acquired: 11/18/2015 Weeks Of Treatment: 235 Clustered Wound: No Wound Measurements Length: (cm) 2.3 Width: (cm) 1 Depth: (cm) 0.1 Area: (cm) 1.806 Volume: (cm) 0.181 % Reduction in Area: 63.2% % Reduction in Volume: 95.4% Epithelialization: Medium (34-66%) Wound  Description Classification: Full Thickness With Exposed Support Structures Wound Margin: Flat and Intact Exudate Amount: Medium Exudate Type: Serosanguineous Exudate Color: red, brown Foul Odor After Cleansing: No Slough/Fibrino Yes Wound Bed Granulation Amount: Large (67-100%) Exposed Structure Granulation Quality: Red, Pink Fascia Exposed: No Necrotic Amount: Small (1-33%) Fat Layer (Subcutaneous Tissue) Exposed: Yes Necrotic Quality: Adherent Slough Tendon Exposed: No Muscle Exposed: No Joint Exposed: No Bone Exposed: No Treatment Notes Wound #1 (Lower Leg) Wound Laterality: Left, Lateral Cleanser Wound Cleanser Discharge Instruction: Wash your hands with soap and water. Remove old dressing, discard into plastic bag and place into trash. Cleanse the wound with Wound Cleanser prior to applying a clean  dressing using gauze sponges, not tissues or cotton balls. Do not scrub or use excessive force. Pat dry using gauze sponges, not tissue or cotton balls. Peri-Wound Care Moisturizing Lotion Discharge Instruction: Suggestions: Theraderm, Eucerin, Cetaphil, or patient preference. Topical Clobetasol Propionate ointment 0.05%, 60 (g) tube Discharge Instruction: Pt to bring to appt- Primary Dressing Hydrofera Blue Ready Transfer Foam, 4x5 (in/in) Discharge Instruction: Apply Hydrofera Blue Ready to wound bed as directed Secondary Dressing Zetuvit Plus Silicone Non-bordered 5x5 (in/in) Harada, Tracen ([id number]) Secured With Compression Wrap Medichoice 4 layer Compression System, 35-40 mmHG Discharge Instruction: Apply multi-layer wrap as directed. Compression Stockings Add-Ons Electronic Signature(s) Signed: 06/08/2021 11:21:01 AM By: Donnamarie Poag Entered ByDonnamarie Poag on 06/08/2021 08:22:00

## 2021-06-08 NOTE — Progress Notes (Signed)
ZAVIYAR, RAHAL ([id number]) Visit Report for 06/08/2021 Physician Orders Details Patient Name: [name], [name] Date of Service: 06/08/2021 8:00 AM Medical Record Number: [id number] Patient Account Number: [id number] Date of Birth/Sex: 01/29/1981 (41 y.o. M) Treating RN: Hansel Feinstein Primary Care Provider: [name] Other Clinician: Referring Provider: [name] Treating Provider/Extender: Altamese Grant in Treatment: 15 Verbal / Phone Orders: No Diagnosis Coding Follow-up Appointments o Return Appointment in 1 week. o Nurse Visit as needed - twice a week Bathing/ Shower/ Hygiene o Clean wound with Normal Saline or wound cleanser. o May shower with wound dressing protected with water repellent cover or cast protector. Edema Control - Lymphedema / Segmental Compressive Device / Other o Optional: One layer of unna paste to top of compression wrap (to act as an anchor). o Elevate, Exercise Daily and Avoid Standing for Long Periods of Time. o Elevate legs to the level of the heart and pump ankles as often as possible o Elevate leg(s) parallel to the floor when sitting. Wound Treatment Wound #1 - Lower Leg Wound Laterality: Left, Lateral Cleanser: Wound Cleanser 3 x Per Week/30 Days Discharge Instructions: Wash your hands with soap and water. Remove old dressing, discard into plastic bag and place into trash. Cleanse the wound with Wound Cleanser prior to applying a clean dressing using gauze sponges, not tissues or cotton balls. Do not scrub or use excessive force. Pat dry using gauze sponges, not tissue or cotton balls. Peri-Wound Care: Moisturizing Lotion 3 x Per Week/30 Days Discharge Instructions: Suggestions: Theraderm, Eucerin, Cetaphil, or patient preference. Topical: Clobetasol Propionate ointment 0.05%, 60 (g) tube 3 x Per Week/30 Days Discharge Instructions: Pt to bring to appt- Primary Dressing: Hydrofera Blue Ready Transfer Foam, 4x5 (in/in) 3 x  Per Week/30 Days Discharge Instructions: Apply Hydrofera Blue Ready to wound bed as directed Secondary Dressing: Zetuvit Plus Silicone Non-bordered 5x5 (in/in) 3 x Per Week/30 Days Compression Wrap: Medichoice 4 layer Compression System, 35-40 mmHG (Generic) 3 x Per Week/30 Days Discharge Instructions: Apply multi-layer wrap as directed. Electronic Signature(s) Signed: 06/08/2021 11:21:01 AM By: Hansel Feinstein Signed: 06/08/2021 3:34:05 PM By: Baltazar Najjar MD Entered By: Hansel Feinstein on 06/08/2021 08:23:50 SUHAYB, ANZALONE ([id number]) -------------------------------------------------------------------------------- SuperBill Details Patient Name: [name] Date of Service: 06/08/2021 Medical Record Number: [id number] Patient Account Number: [id number] Date of Birth/Sex: 15-Aug-1980 (40 y.o. M) Treating RN: Hansel Feinstein Primary Care Provider: [name] Other Clinician: Referring Provider: [name] Treating Provider/Extender: Altamese [place] in Treatment: 235 Diagnosis Coding ICD-10 Codes Code Description I87.2 Venous insufficiency (chronic) (peripheral) L97.222 Non-pressure chronic ulcer of left calf with fat layer exposed E11.622 Type 2 diabetes mellitus with other skin ulcer L88 Pyoderma gangrenosum F17.208 Nicotine dependence, unspecified, with other nicotine-induced disorders Facility Procedures CPT4 Code: [id number] Description: (Facility Use Only) [phone] - APPLY MULTLAY COMPRS LWR LT LEG Modifier: Quantity: 1 Electronic Signature(s) Signed: 06/08/2021 11:21:01 AM By: Hansel Feinstein Signed: 06/08/2021 3:34:05 PM By: Baltazar Najjar MD Entered By: Hansel Feinstein on 06/08/2021 08:24:42

## 2021-06-11 ENCOUNTER — Encounter: Payer: 59 | Admitting: Physician Assistant

## 2021-06-11 ENCOUNTER — Other Ambulatory Visit: Payer: Self-pay

## 2021-06-11 DIAGNOSIS — I872 Venous insufficiency (chronic) (peripheral): Secondary | ICD-10-CM | POA: Diagnosis not present

## 2021-06-11 NOTE — Progress Notes (Addendum)
[name] ([id number]) Visit Report for 06/11/2021 Arrival Information Details Patient Name: [name], [name] Date of Service: 06/11/2021 8:15 AM Medical Record Number: [id number] Patient Account Number: [id number] Date of Birth/Sex: 1980-12-29 (41 y.o. M) Treating RN: Levora Dredge Primary Care Bosco Paparella: [name] Other Clinician: Referring Alaijah Gibler: [name] Treating Dudley Mages/Extender: [name] Cliche in Treatment: 17 Visit Information History Since Last Visit Added or deleted any medications: No Patient Arrived: Ambulatory Any new allergies or adverse reactions: No Arrival Time: 08:17 Had a fall or experienced change in No Accompanied By: self activities of daily living that may affect Transfer Assistance: None risk of falls: Patient Requires Transmission-Based No Hospitalized since last visit: No Precautions: Has Dressing in Place as Prescribed: Yes Patient Has Alerts: Yes Has Compression in Place as Prescribed: Yes Patient Alerts: Patient has reaction Pain Present Now: No to silver dressings. Electronic Signature(s) Signed: 06/11/2021 4:44:26 PM By: Levora Dredge Entered By: Levora Dredge on 06/11/2021 08:19:03 [name], [name] ([id number]) -------------------------------------------------------------------------------- Clinic Level of Care Assessment Details Patient Name: [name] Date of Service: 06/11/2021 8:15 AM Medical Record Number: [id number] Patient Account Number: [id number] Date of Birth/Sex: 04-09-1981 (41 y.o. M) Treating RN: Levora Dredge Primary Care Gerlad Pelzel: [name] Other Clinician: Referring Fread Kottke: [name] Treating Mavery Milling/Extender: [name] Cliche in Treatment: 235 Clinic Level of Care Assessment Items TOOL 1 Quantity Score []  - Use when EandM and Procedure is performed on INITIAL visit 0 ASSESSMENTS - Nursing Assessment / Reassessment []  - General Physical Exam (combine w/ comprehensive assessment  (listed just below) when performed on new 0 pt. evals) []  - 0 Comprehensive Assessment (HX, ROS, Risk Assessments, Wounds Hx, etc.) ASSESSMENTS - Wound and Skin Assessment / Reassessment []  - Dermatologic / Skin Assessment (not related to wound area) 0 ASSESSMENTS - Ostomy and/or Continence Assessment and Care []  - Incontinence Assessment and Management 0 []  - 0 Ostomy Care Assessment and Management (repouching, etc.) PROCESS - Coordination of Care []  - Simple Patient / Family Education for ongoing care 0 []  - 0 Complex (extensive) Patient / Family Education for ongoing care []  - 0 Staff obtains Programmer, systems, Records, Test Results / Process Orders []  - 0 Staff telephones HHA, Nursing Homes / Clarify orders / etc []  - 0 Routine Transfer to another Facility (non-emergent condition) []  - 0 Routine Hospital Admission (non-emergent condition) []  - 0 New Admissions / Biomedical engineer / Ordering NPWT, Apligraf, etc. []  - 0 Emergency Hospital Admission (emergent condition) PROCESS - Special Needs []  - Pediatric / Minor Patient Management 0 []  - 0 Isolation Patient Management []  - 0 Hearing / Language / Visual special needs []  - 0 Assessment of Community assistance (transportation, D/C planning, etc.) []  - 0 Additional assistance / Altered mentation []  - 0 Support Surface(s) Assessment (bed, cushion, seat, etc.) INTERVENTIONS - Miscellaneous []  - External ear exam 0 []  - 0 Patient Transfer (multiple staff / Civil Service fast streamer / Similar devices) []  - 0 Simple Staple / Suture removal (25 or less) []  - 0 Complex Staple / Suture removal (26 or more) []  - 0 Hypo/Hyperglycemic Management (do not check if billed separately) []  - 0 Ankle / Brachial Index (ABI) - do not check if billed separately Has the patient been seen at the hospital within the last three years: Yes Total Score: 0 Level Of Care: ____ [name] ([id number]) Electronic Signature(s) Signed: 06/11/2021 4:44:26 PM  By: Levora Dredge Entered By: Levora Dredge on 06/11/2021 09:00:04 [name] ([id number]) -------------------------------------------------------------------------------- Compression Therapy Details Patient Name: [name] Date of  Service: 06/11/2021 8:15 AM Medical Record Number: [id number] Patient Account Number: [id number] Date of Birth/Sex: [birth year] (40 y.o. M) Treating RN: Levora Dredge Primary Care Suraiya Dickerson: [name] Other Clinician: Referring Zarin Knupp: [name] Treating Leea Rambeau/Extender: [name] Cliche in Treatment: 235 Compression Therapy Performed for Wound Assessment: Wound #1 Left,Lateral Lower Leg Performed By: Clinician Levora Dredge, RN Compression Type: Four Layer Post Procedure Diagnosis Same as Pre-procedure Electronic Signature(s) Signed: 06/11/2021 4:44:26 PM By: Levora Dredge Entered By: Levora Dredge on 06/11/2021 08:44:53 [name], [name] ([id number]) -------------------------------------------------------------------------------- Encounter Discharge Information Details Patient Name: [name] Date of Service: 06/11/2021 8:15 AM Medical Record Number: [id number] Patient Account Number: [id number] Date of Birth/Sex: [birth year] (40 y.o. M) Treating RN: Levora Dredge Primary Care Ginnifer Creelman: [name] Other Clinician: Referring Letica Giaimo: [name] Treating Kollyns Mickelson/Extender: [name] Cliche in Treatment: 235 Encounter Discharge Information Items Discharge Condition: Stable Ambulatory Status: Ambulatory Discharge Destination: Home Transportation: Private Auto Accompanied By: self Schedule Follow-up Appointment: Yes Clinical Summary of Care: Electronic Signature(s) Signed: 06/11/2021 4:44:26 PM By: Levora Dredge Entered By: Levora Dredge on 06/11/2021 [place], Aydian ([id number]) -------------------------------------------------------------------------------- Lower Extremity Assessment  Details Patient Name: [name] Date of Service: 06/11/2021 8:15 AM Medical Record Number: [id number] Patient Account Number: [id number] Date of Birth/Sex: [birth year] (40 y.o. M) Treating RN: Levora Dredge Primary Care Willmar Stockinger: [name] Other Clinician: Referring Alyssa Rotondo: [name] Treating Matei Magnone/Extender: [name] Cliche in Treatment: 235 Edema Assessment Assessed: [Left: No] [Right: No] Edema: [Left: Ye] [Right: s] Calf Left: Right: Point of Measurement: 35 cm From Medial Instep 48.2 cm Ankle Left: Right: Point of Measurement: 10 cm From Medial Instep 29.4 cm Vascular Assessment Pulses: Dorsalis Pedis Palpable: [Left:Yes] Electronic Signature(s) Signed: 06/11/2021 4:44:26 PM By: Levora Dredge Entered By: Levora Dredge on 06/11/2021 08:33:29 [name] ([id number]) -------------------------------------------------------------------------------- Multi Wound Chart Details Patient Name: [name] Date of Service: 06/11/2021 8:15 AM Medical Record Number: [id number] Patient Account Number: [id number] Date of Birth/Sex: [birth year] (40 y.o. M) Treating RN: Levora Dredge Primary Care Aristidis Talerico: [name] Other Clinician: Referring Avante Carneiro: [name] Treating Janah Mcculloh/Extender: [name] Cliche in Treatment: 235 Vital Signs Height(in): 71 Pulse(bpm): 78 Weight(lbs): 338 Blood Pressure(mmHg): 128/86 Body Mass Index(BMI): 47.1 Temperature(F): 98 Respiratory Rate(breaths/min): 18 Photos: [N/A:N/A] Wound Location: Left, Lateral Lower Leg N/A N/A Wounding Event: Gradually Appeared N/A N/A Primary Etiology: Pyoderma N/A N/A Comorbid History: Sleep Apnea, Hypertension, Colitis N/A N/A Date Acquired: 11/18/2015 N/A N/A Weeks of Treatment: 235 N/A N/A Wound Status: Open N/A N/A Wound Recurrence: No N/A N/A Measurements L x W x D (cm) 4.5x3x0.1 N/A N/A Area (cm) : 10.603 N/A N/A Volume (cm) : 1.06 N/A N/A % Reduction in Area:  -116.00% N/A N/A % Reduction in Volume: 73.00% N/A N/A Classification: Full Thickness With Exposed N/A N/A Support Structures Exudate Amount: Medium N/A N/A Exudate Type: Serosanguineous N/A N/A Exudate Color: red, brown N/A N/A Wound Margin: Flat and Intact N/A N/A Granulation Amount: Large (67-100%) N/A N/A Granulation Quality: Red, Pink N/A N/A Necrotic Amount: Small (1-33%) N/A N/A Exposed Structures: Fat Layer (Subcutaneous Tissue): N/A N/A Yes Fascia: No Tendon: No Muscle: No Joint: No Bone: No Epithelialization: Medium (34-66%) N/A N/A Assessment Notes: red spots noted throughout leg pt N/A N/A states ones at top are from him itching leg. Small open area next to large wound area Treatment Notes Electronic Signature(s) Signed: 06/11/2021 4:44:26 PM By: Blenda Nicely, [name] ([id number]) Entered By: Levora Dredge on 06/11/2021 08:43:13 BALTASAR, TWILLEY ([id number]) -------------------------------------------------------------------------------- Leonidas Details Patient Name: [name] Date  of Service: 06/11/2021 8:15 AM Medical Record Number: [id number] Patient Account Number: [id number] Date of Birth/Sex: April 26, 1981 (40 y.o. M) Treating RN: Levora Dredge Primary Care Sharonne Ricketts: [name] Other Clinician: Referring Denis Koppel: [name] Treating Laretha Luepke/Extender: [name] Cliche in Treatment: [place] reviewed with physician Active Inactive Necrotic Tissue Nursing Diagnoses: Impaired tissue integrity related to necrotic/devitalized tissue Knowledge deficit related to management of necrotic/devitalized tissue Goals: Necrotic/devitalized tissue will be minimized in the wound bed Date Initiated: 03/26/2021 Target Resolution Date: 05/26/2021 Goal Status: Active Patient/caregiver will verbalize understanding of reason and process for debridement of necrotic tissue Date Initiated: 03/26/2021 Date  Inactivated: 04/27/2021 Target Resolution Date: 03/26/2021 Goal Status: Met Interventions: Assess patient pain level pre-, during and post procedure and prior to discharge Provide education on necrotic tissue and debridement process Treatment Activities: Apply topical anesthetic as ordered : 03/26/2021 Notes: Electronic Signature(s) Signed: 06/11/2021 4:44:26 PM By: Levora Dredge Entered By: Levora Dredge on 06/11/2021 08:42:57 [name], [name] ([id number]) -------------------------------------------------------------------------------- Pain Assessment Details Patient Name: [name] Date of Service: 06/11/2021 8:15 AM Medical Record Number: [id number] Patient Account Number: [id number] Date of Birth/Sex: Dec 27, 1980 (40 y.o. M) Treating RN: Levora Dredge Primary Care Judaea Burgoon: [name] Other Clinician: Referring Leeam Cedrone: [name] Treating Quanta Roher/Extender: [name] Cliche in Treatment: 235 Active Problems Location of Pain Severity and Description of Pain Patient Has Paino No Site Locations Rate the pain. Current Pain Level: 0 Pain Management and Medication Current Pain Management: Electronic Signature(s) Signed: 06/11/2021 4:44:26 PM By: Levora Dredge Entered By: Levora Dredge on 06/11/2021 08:20:27 [name], [name] ([id number]) -------------------------------------------------------------------------------- Patient/Caregiver Education Details Patient Name: [name] Date of Service: 06/11/2021 8:15 AM Medical Record Number: [id number] Patient Account Number: [id number] Date of Birth/Gender: [birth year] (40 y.o. M) Treating RN: Levora Dredge Primary Care Physician: [name] Other Clinician: Referring Physician: [name] Treating Physician/Extender: [name] Cliche in Treatment: 235 Education Assessment Education Provided To: Patient Education Topics Provided Wound/Skin Impairment: Handouts: Caring for Your Ulcer Methods:  Explain/Verbal Responses: State content correctly Electronic Signature(s) Signed: 06/11/2021 4:44:26 PM By: Levora Dredge Entered By: Levora Dredge on 06/11/2021 09:00:26 [name], [name] ([id number]) -------------------------------------------------------------------------------- Wound Assessment Details Patient Name: [name] Date of Service: 06/11/2021 8:15 AM Medical Record Number: [id number] Patient Account Number: [id number] Date of Birth/Sex: 12/09/1980 (40 y.o. M) Treating RN: Levora Dredge Primary Care Kearah Gayden: [name] Other Clinician: Referring Kashis Penley: [name] Treating Sahaana Weitman/Extender: [name] Cliche in Treatment: 235 Wound Status Wound Number: 1 Primary Etiology: Pyoderma Wound Location: Left, Lateral Lower Leg Wound Status: Open Wounding Event: Gradually Appeared Comorbid History: Sleep Apnea, Hypertension, Colitis Date Acquired: 11/18/2015 Weeks Of Treatment: 235 Clustered Wound: No Photos Wound Measurements Length: (cm) 4.5 Width: (cm) 3 Depth: (cm) 0.1 Area: (cm) 10.603 Volume: (cm) 1.06 % Reduction in Area: -116% % Reduction in Volume: 73% Epithelialization: Medium (34-66%) Tunneling: No Undermining: No Wound Description Classification: Full Thickness With Exposed Support Structures Wound Margin: Flat and Intact Exudate Amount: Medium Exudate Type: Serosanguineous Exudate Color: red, brown Foul Odor After Cleansing: No Slough/Fibrino Yes Wound Bed Granulation Amount: Large (67-100%) Exposed Structure Granulation Quality: Red, Pink Fascia Exposed: No Necrotic Amount: Small (1-33%) Fat Layer (Subcutaneous Tissue) Exposed: Yes Necrotic Quality: Adherent Slough Tendon Exposed: No Muscle Exposed: No Joint Exposed: No Bone Exposed: No Assessment Notes red spots noted throughout leg pt states ones at top are from him itching leg. Small open area next to large wound area Treatment Notes Wound #1 (Lower Leg) Wound  Laterality: Left, Lateral Cleanser Wound Cleanser [name],  [name] ([id number]) Discharge Instruction: Wash your hands with soap and water. Remove old dressing, discard into plastic bag and place into trash. Cleanse the wound with Wound Cleanser prior to applying a clean dressing using gauze sponges, not tissues or cotton balls. Do not scrub or use excessive force. Pat dry using gauze sponges, not tissue or cotton balls. Peri-Wound Care Moisturizing Lotion Discharge Instruction: Suggestions: Theraderm, Eucerin, Cetaphil, or patient preference. Topical Clobetasol Propionate ointment 0.05%, 60 (g) tube Discharge Instruction: Pt to bring to appt- Triamcinolone Acetonide Cream, 0.1%, 15 (g) tube Discharge Instruction: Apply as directed by Pape Parson. To leg not on wound Primary Dressing Hydrofera Blue Ready Transfer Foam, 4x5 (in/in) Discharge Instruction: Apply Hydrofera Blue Ready to wound bed as directed Secondary Dressing Zetuvit Plus Silicone Non-bordered 5x5 (in/in) Secured With Compression Wrap Medichoice 4 layer Compression System, 35-40 mmHG Discharge Instruction: Apply multi-layer wrap as directed. Compression Stockings Add-Ons Electronic Signature(s) Signed: 06/11/2021 4:44:26 PM By: Levora Dredge Entered By: Levora Dredge on 06/11/2021 08:42:01 [name], [name] ([id number]) -------------------------------------------------------------------------------- Vitals Details Patient Name: [name] Date of Service: 06/11/2021 8:15 AM Medical Record Number: [id number] Patient Account Number: [id number] Date of Birth/Sex: 09/29/1980 (40 y.o. M) Treating RN: Levora Dredge Primary Care Radiah Lubinski: [name] Other Clinician: Referring Denette Hass: [name] Treating Letita Prentiss/Extender: [name] Cliche in Treatment: 235 Vital Signs Time Taken: 08:20 Temperature (F): 98 Height (in): 71 Pulse (bpm): 86 Weight (lbs): 338 Respiratory Rate (breaths/min): 18 Body Mass  Index (BMI): 47.1 Blood Pressure (mmHg): 128/86 Reference Range: 80 - 120 mg / dl Electronic Signature(s) Signed: 06/11/2021 4:44:26 PM By: Levora Dredge Entered By: Levora Dredge on 06/11/2021 08:20:19

## 2021-06-11 NOTE — Progress Notes (Addendum)
[name], [name] ([id number]) Visit Report for 06/11/2021 Chief Complaint Document Details Patient Name: [name], [name] Date of Service: 06/11/2021 8:15 AM Medical Record Number: [id number] Patient Account Number: [id number] Date of Birth/Sex: 1981-01-29 (41 y.o. 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 M) Treating RN: Levora Dredge Primary Care Provider: [name] Other Clinician: Referring Provider: [name] Treating Provider/Extender: [name] Cliche in Treatment: 73 History of Present Illness HPI Description: 12/04/16; 41 year old man who comes into the clinic today for review of a wound on the posterior left calf. He tells me that is been there for about a year. He is not a diabetic he does smoke half a pack per day. He was seen in the ER on 11/20/16 felt to have cellulitis around the wound and was given clindamycin. An x-ray did not show osteomyelitis. The patient initially tells me that he has a milk allergy that sets off a pruritic itching rash on his lower legs which she scratches incessantly and he thinks that's what may have set up the wound. He has been using various topical antibiotics and ointments without any effect. He works in a [employer] and is on his feet all day. He does not have a  prior history of wounds however he does have the rash on both lower legs the right arm and the ventral aspect of his left arm. These are excoriations and clearly have had scratching however there are of macular looking areas on both legs including a substantial larger area on the right leg. This does not have an underlying open area. There is no blistering. The patient tells me that 2 years ago in [place] in response to the rash on his legs he saw a dermatologist who told him he had a condition which may be pyoderma gangrenosum although I may be putting words into his mouth. He seemed to recognize this. On further questioning he admits to a 5 year history of quiesced. ulcerative colitis. He is not in any treatment for this. He's had no recent travel 12/11/16; the patient arrives today with his wound and roughly the same condition we've been using silver alginate this is a deep punched out wound with some surrounding erythema but no tenderness. Biopsy I did did not show confirmed pyoderma gangrenosum suggested nonspecific inflammation and vasculitis but does not provide an actual description of what was seen by the pathologist. I'm really not able to understand this We have also received information from the patient's dermatologist in [place] notes from April 2016. This was a doctor Agarwal-antal. 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 He was prescribed topical steroid high potency under occlusion which helped but at this point the patient did not have a deep punched out wound. 12/18/16; the patient's wound is larger in terms of surface area however this surface looks better and there is less depth. The surrounding erythema also is better. The patient states that the wrap we put on came off 2 days ago when he has been using his compression stockings.  He we are in the process of getting a dermatology consult. 12/26/16 on evaluation today patient's left lower extremity wound shows evidence of  infection with surrounding erythema noted. He has been tolerating the dressing changes but states that he has noted more discomfort. There is a larger area of erythema surrounding the wound. No fevers, chills, nausea, or vomiting noted at this time. With that being said the wound still does have slough covering the surface. He is not allergic to any medication that he is aware of at this point. In regard to his right lower extremity he had several regions that are erythematous and pruritic he wonders if there's anything we can do to help that. 01/02/17 I reviewed patient's wound culture which was obtained his visit last week. He was placed on doxycycline at that point. Unfortunately that does not appear to be an antibiotic that would likely help with the situation however the pseudomonas noted on culture is sensitive to Cipro. Also unfortunately patient's wound seems to have a large compared to last week's evaluation. Not severely so but there are definitely increased measurements in general. He is continuing to have discomfort as well he writes this to be a seven out of 10. In fact he would prefer me not to perform any debridement today due to the fact that he is having discomfort and considering he has an active infection on the little reluctant to do so anyway. No fevers, chills, nausea, or vomiting noted at this time. 01/08/17; patient seems dermatology on September 5. I suspect dermatology will want the slides from the biopsy I did sent to their pathologist. I'm not sure if there is a way we can expedite that. In any case the culture I did before I left on vacation 3 weeks ago showed Pseudomonas he was given 10 days of Cipro and per her description of her intake nurses is actually somewhat better this week although the wound is quite a bit bigger than I remember the last time I saw this. He still has 3 more days of Cipro 01/21/17; dermatology appointment tomorrow. He has completed the ciprofloxacin  for Pseudomonas. Surface of the wound looks better however he is had some deterioration in the lesions on his right leg. Meantime the left lateral leg wound we will continue with sample 01/29/17; patient had his dermatology appointment but I can't yet see that note. He is completed his antibiotics. The wound is more superficial but considerably larger in circumferential area than when he came in. This is in his left lateral calf. He also has swollen erythematous areas with superficial wounds on the right leg and small papular areas on both arms. There apparently areas in her his upper thighs and buttocks I did not look at those. Dermatology biopsied the right leg. Hopefully will have their input next week. 02/05/17; patient went back to see his dermatologist who told him that he had a "scratching problem" as well as staph. He is now on a 30 day course of doxycycline and I believe she gave him triamcinolone cream to the right leg areas to help with the itching [not exactly sure but probably triamcinolone]. She apparently looked at the left lateral leg wound although this was not rebiopsied and I think felt to be ultimately part of the same pathogenesis. He is using sample border foam and changing nevus himself. He now has a new open area on the right posterior leg which was his biopsy site I don't have any of the dermatology notes  02/12/17; we put the patient in compression last week with SANTYL to the wound on the left leg and the biopsy. Edema is much better and the depth of the wound is now at level of skin. Area is still the same oBiopsy site on the right lateral leg we've also been using santyl with a border foam dressing and he is changing this himself. 02/19/17; Using silver alginate started last week to both the substantial left leg wound and the biopsy site on the right wound. He is tolerating compression well. Has a an appointment with his primary M.D. tomorrow wondering about diuretics although  I'm wondering if the edema problem is actually lymphedema 02/26/17; the patient has been to see his primary doctor Dr. Jerrel Ivory at [place] our primary care. She started him on Lasix 20 mg and this seems to have helped with the edema. However we are not making substantial change with the left lateral calf wound and inflammation. The biopsy site on the right leg also looks stable but not really all that different. 03/12/17; the patient has been to see vein and vascular Dr. Lucky Cowboy. He has had venous reflux studies I have not reviewed these. I did get a call from his dermatology office. They felt that he might have pathergy based on their biopsy on his right leg which led them to look at the slides of [name], [name] ([id number]) the biopsy I did on the left leg and they wonder whether this represents pyoderma gangrenosum which was the original supposition in a man with ulcerative colitis albeit inactive for many years. They therefore recommended clobetasol and tetracycline i.e. aggressive treatment for possible pyoderma gangrenosum. 03/26/17; apparently the patient just had reflux studies not an appointment with Dr. dew. She arrives in clinic today having applied clobetasol for 2-3 weeks. He notes over the last 2-3 days excessive drainage having to change the dressing 3-4 times a day and also expanding erythema. 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 Lillia Carmel at [facility] dermatology locally who kindly saw him at my request. 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 My concern was the degree of erythema medially extending from his left leg wound which was either inflammation from pyoderma  or cellulitis. I put him on Augmentin however culture of the wound showed Pseudomonas which is quinolone sensitive. I really don't believe he has cellulitis however in view of everything I will continue and give him a course of Cipro. He is also on doxycycline as an immune modulator for the pyoderma. In addition to his original wound on the left lateral leg with surrounding erythema he has a wound on the right posterior calf which was an original biopsy site done by dermatology. This was felt to represent pathergy from pyoderma gangrenosum 04/16/17; pyoderma gangrenosum. Saw Dr. Lillia Carmel yesterday. He has been using topical antibiotics to both wound areas his original wound on the left and the biopsies/pathergy area on the right. There is definitely some improvement in the inflammation around the wound on the right although the patient states he has increasing sensitivity of the wounds. He is on prednisone 60 and doxycycline 1 as prescribed by Dr. Lillia Carmel. He is covering the topical antibiotic with gauze  and putting this in his own compression stocks and changing this daily. He states that Dr. Lottie Rater did a culture of the left leg wound yesterday 05/07/17; pyoderma gangrenosum. The patient saw Dr. Lillia Carmel yesterday and has a follow-up with her in one month. He is still using topical antibiotics to both wounds although he can't recall exactly what type. He is still on prednisone 60 mg. Dr. Lillia Carmel stated that the doxycycline could stop if we were in agreement. 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 The right medial leg was induced by biopsy/pathergy. The area on the right is essentially healed. 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 The right medial leg has closed and remains closed. He  is still on prednisone 60. oHe tells me he missed his last dermatology appointment with Dr. Lillia Carmel but will make another appointment. He reports that her blood sugar at a recent screen in [place] was high 200's. He was 180 today. 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 I think he is going to require still much longer prednisone perhaps another 3 months before attempting to taper. In the meantime his wound is a lot better. Smaller. He is cleaning this off daily and applying topical antibiotics. When he was last in the clinic I thought about changing to [facility] and actually put in a couple of calls to dermatology although probably not during their business hours. 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 He continues on oral prednisone. He has been using triple antibiotic ointment. There is surface debris and we will transition to [facility] and have him return in 2 weeks. He has lost 30 pounds since his last appointment with lifestyle modification. He may benefit from topical steroid cream for treatment this can be considered at a later date. 08/22/17 on evaluation today patient appears to actually be doing rather well in regard to his left lateral lower extremity ulcer. He has actually been managed by Dr. Dellia Nims most recently. Patient is currently on oral steroids at this time. This seems to have been of benefit for him. Nonetheless his last visit was actually with Leah on 08/06/17. Currently he is not utilizing any topical steroid creams although this could be of benefit as well. No fevers, chills, nausea, or vomiting noted at this time. 09/05/17 on evaluation today patient appears to be doing better in regard to his left lateral lower extremity ulcer. He has been tolerating the dressing changes without complication. He is using Santyl with good effect. Overall I'm  very pleased with how things are standing at this point. Patient likewise is happy that this is doing better. 09/19/17 on evaluation today patient actually appears to be doing rather well in regard to his left lateral lower extremity ulcer. 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 He's not having any significant pain. 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 He has been tolerating the Santyl without complication and in general I feel like he's making good progress. 10/17/17 on evaluation today patient  appears to be doing very well in regard to his left lateral lower surety ulcer. He has been tolerating the dressing changes without complication. There does not appear to be any evidence of infection he's alternating the Santyl and the triple antibiotic ointment every other day this seems to be doing well for him. 11/03/17 on evaluation today patient appears to be doing very well in regard to his left lateral lower extremity ulcer. He is been tolerating the dressing changes without complication which is good news. Fortunately there does not appear to be any evidence of infection which is also great news. Overall is doing excellent they are starting to taper down on the prednisone is down to 40 mg at this point it also started topical clobetasol for him. 11/17/17 on evaluation today patient appears to be doing well in regard to his left lateral lower surety ulcer. He's been tolerating the dressing changes without complication. He does note that he is having no pain, no excessive drainage or discharge, and overall he feels like things are going about how he would expect and hope they would. Overall he seems to have no evidence of infection at this time in my opinion which is good news. 12/04/17-He is seen in follow-up  evaluation for right lateral lower extremity ulcer. He has been applying topical steroid cream. Today's measurement show slight increase in size. Over the next 2 weeks we will transition to every other day Santyl and steroid cream. He has been encouraged to monitor for changes and notify clinic with any concerns 12/15/17 on evaluation today patient's left lateral motion the ulcer and fortunately is doing worse again at this point. This just since last week to this week has close to doubled in size according to the patient. I did not seeing last week's I do not have a visual to compare this to in our system was also down so we do not have all the charts and at this point. Nonetheless it does have me somewhat concerned in regard to the fact that again he was worried enough about it he has contact the dermatology that placed them back on the full strength, 50 mg a day of the prednisone that he was taken previous. He continues to alternate using clobetasol along with Santyl at this point. He is obviously somewhat frustrated. 12/22/17 on evaluation today patient appears to be doing a little worse compared to last evaluation. Unfortunately the wound is a little deeper and slightly larger than the last week's evaluation. With that being said he has made some progress in regard to the irritation surrounding at this time unfortunately despite that progress that's been made he still has a significant issue going on here. 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 [name], [name] ([id number]) For that reason I discussed with him today the possibility of perform a wound culture to ensure there's nothing overtly infected. 01/06/18 on evaluation today patient's wound is larger and deeper than previously evaluated. With that being said it did appear that his wound was  infected after my last evaluation with him. Subsequently I did end up prescribing a prescription for Bactrim DS which she has been taking and having no complication with. Fortunately there does not appear to be any evidence of infection at this point in time as far as anything spreading, no want to touch, and overall I feel like  things are showing signs of improvement. 01/13/18 on evaluation today patient appears to be even a little larger and deeper than last time. There still muscle exposed in the base of the wound. Nonetheless he does appear to be less erythematous I do believe inflammation is calming down also believe the infection looks like it's probably resolved at this time based on what I'm seeing. No fevers, chills, nausea, or vomiting noted at this time. 01/30/18 on evaluation today patient actually appears to visually look better for the most part. Unfortunately those visually this looks better he does seem to potentially have what may be an abscess in the muscle that has been noted in the central portion of the wound. This is the first time that I have noted what appears to be fluctuance in the central portion of the muscle. With that being said I'm somewhat more concerned about the fact that this might indicate an abscess formation at this location. I do believe that an ultrasound would be appropriate. This is likely something we need to try to do as soon as possible. He has been switch to mupirocin ointment and he is no longer using the steroid ointment as prescribed by dermatology he sees them again next week he's been decreased from 60 to 40 mg of prednisone. 03/09/18 on evaluation today patient actually appears to be doing a little better compared to last time I saw him. There's not as much erythema surrounding the wound itself. He I did review his most recent infectious disease note which was dated 02/24/18. He saw Dr. Michel Bickers in [place]. 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 Patient is now off of antibiotics and they are continually observing this. There seems to be no change in the past two weeks in my pinion based on what the patient says and what I see today compared to what Dr. Megan Salon likely saw two weeks ago. No fevers, chills, nausea, or vomiting noted at this time. 03/23/18 on evaluation today patient's wound actually appears to be showing signs of improvement which is good news. He is currently still on the Dapsone. He is also working on tapering the prednisone to get off of this and Dr. Lottie Rater is working with him in this regard. Nonetheless overall I feel like the wound is doing well it does appear based on the infectious disease note that I reviewed from Dr. 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 I did also review the results of his ultrasound of left lower extremity which revealed there was a dentist tissue in the base of the wound without an abscess noted. 04/06/18 on evaluation today the patient's left lateral lower extremity ulcer actually appears to be doing fairly well which is excellent news. There does not appear to be any evidence of infection at this time which is also great news. Overall he still does have a significantly large ulceration although little by little he seems to be making progress. He is down to 10 mg a day of the prednisone. 04/20/18 on evaluation today patient actually appears to be doing excellent at this time in regard to his left lower extremity ulcer. He's making signs of good progress unfortunately this is taking much longer than we would really like to see but nonetheless he is  making progress. Fortunately there does not appear to be any evidence of infection at this time. No fevers, chills, nausea, or vomiting noted at  this time. The patient has not been using the Santyl due to the cost he hadn't got in this field yet. He's mainly been using the antibiotic ointment topically. Subsequently he also tells me that he really has not been scrubbing in the shower I think this would be helpful again as I told him it doesn't have to be anything too aggressive to even make it believe just enough to keep it free of some of the loose slough and biofilm on the wound surface. 05/11/18 on evaluation today patient's wound appears to be making slow but sure progress in regard to the left lateral lower extremity ulcer. He is been tolerating the dressing changes without complication. Fortunately there does not appear to be any evidence of infection at this time. He is still just using triple antibiotic ointment along with clobetasol occasionally over the area. He never got the Santyl and really does not seem to intend to in my pinion. 06/01/18 on evaluation today patient appears to be doing a little better in regard to his left lateral lower extremity ulcer. He states that overall he does not feel like he is doing as well with the Dapsone as he did with the prednisone. Nonetheless he sees his dermatologist later today and is gonna talk to them about the possibility of going back on the prednisone. Overall again I believe that the wound would be better if you would utilize Santyl but he really does not seem to be interested in going back to the [place] at this point. He has been using triple antibiotic ointment. 06/15/18 on evaluation today patient's wound actually appears to be doing about the same at this point. Fortunately there is no signs of infection at this time. He has made slight improvements although he continues to not really want to clean the wound bed at this point. He states that he just doesn't mess with it he doesn't want to cause any problems with everything else he has going on. He has been on medication, antibiotics  as prescribed by his dermatologist, for a staff infection of his lower extremities which is really drying out now and looking much better he tells me. Fortunately there is no sign of overall infection. 06/29/18 on evaluation today patient appears to be doing well in regard to his left lateral lower surety ulcer all things considering. Fortunately his staff infection seems to be greatly improved compared to previous. He has no signs of infection and this is drying up quite nicely. He is still the doxycycline for this is no longer on cental, Dapsone, or any of the other medications. His dermatologist has recommended possibility of an infusion but right now he does not want to proceed with that. 07/13/18 on evaluation today patient appears to be doing about the same in regard to his left lateral lower surety ulcer. Fortunately there's no signs of infection at this time which is great news. Unfortunately he still builds up a significant amount of Slough/biofilm of the surface of the wound he still is not really cleaning this as he should be appropriately. Again I'm able to easily with saline and gauze remove the majority of this on the surface which if you would do this at home would likely be a dramatic improvement for him as far as getting the area to improve. Nonetheless overall I still feel like he  is making progress is just very slow. I think Santyl will be of benefit for him as well. Still he has not gotten this as of this point. 07/27/18 on evaluation today patient actually appears to be doing little worse in regards of the erythema around the periwound region of the wound he also tells me that he's been having more drainage currently compared to what he was experiencing last time I saw him. He states not quite as bad as what he had because this was infected previously but nonetheless is still appears to be doing poorly. Fortunately there is no evidence of systemic infection at this point. The patient  tells me that he is not going to be able to afford the Santyl. He is still waiting to hear about the infusion therapy with his dermatologist. Apparently she wants an updated colonoscopy first. 08/10/18 on evaluation today patient appears to be doing better in regard to his left lateral lower extremity ulcer. Fortunately he is showing signs of improvement in this regard he's actually been approved for Remicade infusion's as well although this has not been scheduled as of yet. Fortunately there's no signs of active infection at this time in regard to the wound although he is having some issues with infection of the right lower extremity is been seen as dermatologist for this. Fortunately they are definitely still working with him trying to keep things under control. [name], [name] ([id number]) 09/07/18 on evaluation today patient is actually doing rather well in regard to his left lateral lower extremity ulcer. He notes these actually having some hair grow back on his extremity which is something he has not seen in years. He also tells me that the pain is really not giving them any trouble at this time which is also good news overall she is very pleased with the progress he's using a combination of the mupirocin along with the probate is all mixed. 09/21/18 on evaluation today patient actually appears to be doing fairly well all things considered in regard to his looks from the ulcer. He's been tolerating the dressing changes without complication. Fortunately there's no signs of active infection at this time which is good news he is still on all antibiotics or prevention of the staff infection. He has been on prednisone for time although he states it is gonna contact his dermatologist and see if she put them on a short course due to some irritation that he has going on currently. Fortunately there's no evidence of any overall worsening this is going very slow I think cental would be something that would be  helpful for him although he states that $50 for tube is quite expensive. He therefore is not willing to get that at this point. 10/06/18 on evaluation today patient actually appears to be doing decently well in regard to his left lateral leg ulcer. He's been tolerating the dressing changes without complication. Fortunately there's no signs of active infection at this time. Overall I'm actually rather pleased with the progress he's making although it's slow he doesn't show any signs of infection and he does seem to be making some improvement. I do believe that he may need a switch up and dressings to try to help this to heal more appropriately and quickly. 10/19/18 on evaluation today patient actually appears to be doing better in regard to his left lateral lower extremity ulcer. This is shown signs of having much less Slough buildup at this point due to the fact he has been using  the Santyl. Obviously this is very good news. The overall size of the wound is not dramatically smaller but again the appearance is. 11/02/18 on evaluation today patient actually appears to be doing quite well in regard to his lower [place] ulcer. A lot of the skin around the ulcer is actually somewhat irritating at this point this seems to be more due to the dressing causing irritation from the adhesive that anything else. Fortunately there is no signs of active infection at this time. 11/24/18 on evaluation today patient appears to be doing a little worse in regard to his overall appearance of his lower extremity ulcer. There's more erythema and warmth around the wound unfortunately. He is currently on doxycycline which he has been on for some time. With that being said I'm not sure that seems to be helping with what appears to possibly be an acute cellulitis with regard to his left lower extremity ulcer. No fevers, chills, nausea, or vomiting noted at this time. 12/08/18 on evaluation today patient's wounds actually appears to  be doing significantly better compared to his last evaluation. He has been using Santyl along with alternating tripling about appointment as well as the steroid cream seems to be doing quite well and the wound is showing signs of improvement which is excellent news. Fortunately there's no evidence of infection and in fact his culture came back negative with only normal skin flora noted. 12/21/2018 upon evaluation today patient actually appears to be doing excellent with regard to his ulcer. This is actually the best that I have seen it since have been helping to take care of him. It is both smaller as well as less slough noted on the surface of the wound and seems to be showing signs of good improvement with new skin growing from the edges. He has been using just the triamcinolone he does wonder if he can get a refill of that ointment today. 01/04/2019 upon evaluation today patient actually appears to be doing well with regard to his left lateral lower extremity ulcer. With that being said it does not appear to be that he is doing quite as well as last time as far as progression is concerned. There does not appear to be any signs of infection or significant irritation which is good news. With that being said I do believe that he may benefit from switching to a collagen based dressing based on how clean The wound appears. 01/18/2019 on evaluation today patient actually appears to be doing well with regard to his wound on the left lower extremity. He is not made a lot of progress compared to where we were previous but nonetheless does seem to be doing okay at this time which is good news. There is no signs of active infection which is also good news. My only concern currently is I do wish we can get him into utilizing the collagen dressing his insurance would not pay for the supplies that we ordered although it appears that he may be able to order this through his supply company that he typically utilizes.  This is [employer]. Nonetheless he did try to order it during the office visit today and it appears this did go through. We will see if he can get that it is a different brand but nonetheless he has collagen and I do think will be beneficial. 02/01/2019 on evaluation today patient actually appears to be doing a little worse today in regard to the overall size of his wounds. Fortunately there  is no signs of active infection at this time. That is visually. Nonetheless when this is happened before it was due to infection. For that reason were somewhat concerned about that this time as well. 02/08/2019 on evaluation today patient unfortunately appears to be doing slightly worse with regard to his wound upon evaluation today. Is measuring a little deeper and a little larger unfortunately. I am not really sure exactly what is causing this to enlarge he actually did see his dermatologist she is going to see about initiating Humira for him. Subsequently she also did do steroid injections into the wound itself in the periphery. Nonetheless still nonetheless he seems to be getting a little bit larger he is gone back to just using the steroid cream topically which I think is appropriate. I would say hold off on the collagen for the time being is definitely a good thing to do. Based on the culture results which we finally did get the final result back regarding it shows staph as the bacteria noted again that can be a normal skin bacteria based on the fact however he is having increased drainage and worsening of the wound measurement wise I would go ahead and place him on an antibiotic today I do believe for this. 02/15/2019 on evaluation today patient actually appears to be doing somewhat better in regard to his ulcer. There is no signs of worsening at this time I did review his culture results which showed evidence of Staphylococcus aureus but not MRSA. Again this could just be more related to the normal skin  bacteria although he states the drainage has slowed down quite a bit he may have had a mild infection not just colonization. And was much smaller and then since around10/04/2019 on evaluation today patient appears to be doing unfortunately worse as far as the size of the wound. I really feel like that this is steadily getting larger again it had been doing excellent right at the beginning of September we have seen a steady increase in the area of the wound it is almost 2-1/2 times the size it was on September 1. Obviously this is a bad trend this is not wanting to see. For that reason we went back to using just the topical triamcinolone cream which does seem to help with inflammation. I checked him for bacteria by way of culture and nothing showed positive there. I am considering giving him a short course of a tapering steroid Dosepak today to see if that is can be beneficial for him. The patient is in agreement with giving that a try. 03/08/2019 on evaluation today patient appears to be doing very well in comparison to last evaluation with regard to his lower extremity ulcer. This is showing signs of less inflammation and actually measuring slightly smaller compared to last time every other week over the past month and a half he has been measuring larger larger larger. Nonetheless I do believe that the issue has been inflammation the prednisone does seem to [facility] [place] ([id number]) have been beneficial for him which is good news. No fevers, chills, nausea, vomiting, or diarrhea. 03/22/2019 on evaluation today patient appears to be doing about the same with regard to his leg ulcer. He has been tolerating the dressing changes without complication. With that being said the wound seems to be mostly arrested at its current size but really is not making any progress except for when we prescribed the prednisone. He did show some signs of dropping as far as  the overall size of the wound during that interval  week. Nonetheless this is something he is not on long-term at this point and unfortunately I think he is getting need either this or else the Humira which his dermatologist has discussed try to get approval for. With that being said he will be seeing his dermatologist on the 11th of this month that is November. 04/19/2019 on evaluation today patient appears to be doing really about the same the wound is measuring slightly larger compared to last time I saw him. He has not been into the office since November 2 due to the fact that he unfortunately had Covid as that his entire family. He tells me that it was rough but they did pull-through and he seems to be doing much better. Fortunately there is no signs of active infection at this time. No fevers, chills, nausea, vomiting, or diarrhea. 05/10/2019 on evaluation today patient unfortunately appears to be doing significantly worse as compared to last time I saw him. He does tell me that he has had his first dose of Humira and actually is scheduled to get the next one in the upcoming week. With that being said he tells me also that in the past several days he has been having a lot of issues with green drainage she showed me a picture this is more blue-green in color. He is also been having issues with increased sloughy buildup and the wound does appear to be larger today. Obviously this is not the direction that we want everything to take based on the starting of his Humira. Nonetheless I think this is definitely a result of likely infection and to be honest I think this is probably Pseudomonas causing the infection based on what I am seeing. 05/24/2019 on evaluation today patient unfortunately appears to be doing significantly worse compared to his prior evaluation with me 2 weeks ago. I did review his culture results which showed that he does have Staph aureus as well as Pseudomonas noted on the culture. Nonetheless the Levaquin that I prescribed for him  does not appear to have been appropriate and in fact he tells me he is no longer experiencing the green drainage and discharge that he had at the last visit. Fortunately there is no signs of active infection at this time which is good news although the wound has significantly worsened it in fact is much deeper than it was previous. We have been utilizing up to this point triamcinolone ointment as the prescription topical of choice but at this time I really feel like that the wound is getting need to be packed in order to appropriately manage this due to the deeper nature of the wound. Therefore something along the lines of an alginate dressing may be more appropriate. 05/31/2019 upon inspection today patient's wound actually showed signs of doing poorly at this point. Unfortunately he just does not seem to be making any good progress despite what we have tried. He actually did go ahead and pick up the Cipro and start taking that as he was noticing more green drainage he had previously completed the Levaquin that I prescribed for him as well. Nonetheless he missed his appointment for the seventh last week on Wednesday with the wound care center and [facility] where his dermatologist referred him. Obviously I do think a second opinion would be helpful at this point especially in light of the fact that the patient seems to be doing so poorly despite the fact  that we have tried everything that I really know how at this point. The only thing that ever seems to have helped him in the past is when he was on high doses of continual steroids that did seem to make a difference for him. 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 06/07/2019 upon evaluation today patient unfortunately appears to be doing worse yet again with regard to his wound. In fact I  am starting to question whether or not he may have a fluid pocket in the muscle at this point based on the bulging and the soft appearance to the central portion of the muscle area. There is not anything draining from the muscle itself at this time which is good news but nonetheless the wound is expanding. I am not really seeing any results of the Humira as far as overall wound progression based on what I am seeing at this point. The patient has been referred for second opinion with regard to his wound to the [facility] wound care center by his dermatologist which I definitely am not in opposition to. Unfortunately we tried multiple dressings in the past including collagen, alginate, and at one point even Hydrofera Blue. With that being said he is never really used it for any significant amount of time due to the fact that he often complains of pain associated with these dressings and then will go back to either using the Santyl which she has done intermittently or more frequently the triamcinolone. He is also using his own compression stockings. We have wrapped him in the past but again that was something else that he really was not a big fan of. 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 He seemed to do much better when he was on the steroids although honestly I understand that the steroids are not necessarily the best medication to be on long-term obviously 06/14/2019 on evaluation today patient appears to be doing actually a little bit better with regard to the overall appearance with his leg. Unfortunately he does continue to have issues with what appears to be some fluid underneath  the muscle although he did see the wound specialty center at [facility] last week their main goals were to see about infusion therapy in place of the Humira as they feel like that is not quite strong enough. They also recommended that we continue with the treatment otherwise as we are they felt like that was appropriate and they are okay with him continuing to follow-up here with [place] in that regard. With that being said they are also sending him to the vein specialist there to see about vein stripping and if that would be of benefit for him. Subsequently they also did not really address whether or not an ultrasound of the muscle area to see if there is anything that needs to be addressed here would be appropriate or not. For that reason I discussed this with him last week I think we  may proceed down that road at this point. 06/21/2019 upon evaluation today patient's wound actually appears to be doing slightly better compared to previous evaluations. I do believe that he has made a difference with regard to the progression here with the use of oral steroids. Again in the past has been the only thing that is really calm things down. He does tell me that from [facility] is gotten a good news from there that there are no further vein stripping that is necessary at this point. I do not have that available for review today although the patient did relay this to me. He also did obtain and have the ultrasound of the wound completed which I did sign off on today. It does appear that there is no fluid collection under the muscle this is likely then just edematous tissue in general. That is also good news. Overall I still believe the inflammation is the main issue here. He did inquire about the possibility of a wound VAC again with the muscle protruding like it is I am not really sure whether the wound VAC is necessarily ideal or not. That is something we will have to consider although I do believe he may need compression  wrapping to try to help with edema control which could potentially be of benefit. 06/28/2019 on evaluation today patient appears to be doing slightly better measurement wise although this is not terribly smaller he least seems to be trending towards that direction. With that being said he still seems to have purulent drainage noted in the wound bed at this time. He has been on Levaquin followed by Cipro over the past month. Unfortunately he still seems to have some issues with active infection at this time. I did perform a culture last week in order to evaluate and see if indeed there was still anything going on. Subsequently the culture did come back showing Pseudomonas which is consistent with the drainage has been having which is blue-green in color. He also has had an odor that again was somewhat consistent with Pseudomonas as well. Long story short it appears that the culture showed an intermediate finding with regard to how well the Cipro will work for the Pseudomonas infection. Subsequently being that he does not seem to be clearing up and at best what we are doing is just keeping this at [place] I think he may need to see infectious disease to discuss IV antibiotic options. [name], [name] ([id number]) 07/05/2019 upon evaluation today patient appears to be doing okay in regard to his leg ulcer. He has been tolerating the dressing changes at this point without complication. Fortunately there is no signs of active infection at this time which is good news. No fevers, chills, nausea, vomiting, or diarrhea. With that being said he does have an appointment with infectious disease tomorrow and his primary care on Wednesday. Again the reason for the infectious disease referral was due to the fact that he did not seem to be fully resolving with the use of oral antibiotics and therefore we were thinking that IV antibiotic therapy may be necessary secondary to the fact that there was an intermediate finding for  how effective the Cipro may be. Nonetheless again he has been having a lot of purulent and even green drainage. Fortunately right now that seems to have calmed down over the past week with the reinitiation of the oral antibiotic. Nonetheless we will see what Dr. Megan Salon has to say. 07/12/2019 upon evaluation today patient appears to be  doing about the same at this point in regard to his left lower extremity ulcer. Fortunately there is no signs of active infection at this time which is good news I do believe the Levaquin has been beneficial I did review Dr. Hale Bogus note and to be honest I agree that the patient's leg does appear to be doing better currently. What we found in the past as he does not seem to really completely resolve he will stop the antibiotic and then subsequently things will revert back to having issues with blue-green drainage, increased pain, and overall worsening in general. Obviously that is the reason I sent him back to infectious disease. 07/19/2019 upon evaluation today patient appears to be doing roughly the same in size there is really no dramatic improvement. He has started back on the Levaquin at this point and though he seems to be doing okay he did still have a lot of blue/green drainage noted on evaluation today unfortunately. I think that this is still indicative more likely of a Pseudomonas infection as previously noted and again he does see Dr. Megan Salon in just a couple of days. I do not know that were really able to effectively clear this with just oral antibiotics alone based on what I am seeing currently. Nonetheless we are still continue to try to manage as best we can with regard to the patient and his wound. I do think the wrap was helpful in decreasing the edema which is excellent news. No fevers, chills, nausea, vomiting, or diarrhea. 07/26/2019 upon evaluation today patient appears to be doing slightly better with regard to the overall appearance of the muscle  there is no dark discoloration centrally. Fortunately there is no signs of active infection at this time. No fevers, chills, nausea, vomiting, or diarrhea. Patient's wound bed currently the patient did have an appointment with Dr. Megan Salon at infectious disease last week. With that being said Dr. Megan Salon the patient states was still somewhat hesitant about put him on any IV antibiotics he wanted [place] to repeat cultures today and then see where things go going forward. He does look like Dr. Megan Salon because of some improvement the patient did have with the Levaquin wanted [place] to see about repeating cultures. If it indeed grows the Pseudomonas again then he recommended a possibility of considering a PICC line placement and IV antibiotic therapy. He plans to see the patient back in 1 to 2 weeks. 08/02/2019 upon evaluation today patient appears to be doing poorly with regard to his left lower extremity. We did get the results of his culture back it shows that he is still showing evidence of Pseudomonas which is consistent with the purulent/blue-green drainage that he has currently. Subsequently the culture also shows that he now is showing resistance to the oral fluoroquinolones which is unfortunate as that was really the only thing to treat the infection prior. I do believe that he is looking like this is going require IV antibiotic therapy to get this under control. Fortunately there is no signs of systemic infection at this time which is good news. The patient does see Dr. Megan Salon tomorrow. 08/09/2019 upon evaluation today patient appears to be doing better with regard to his left lower extremity ulcer in regard to the overall appearance. He is currently on IV antibiotic therapy. As ordered by Dr. Megan Salon. Currently the patient is on ceftazidime which she is going to take for the next 2 weeks and then follow-up for 4 to 5-week appointment with Dr. Megan Salon.  The patient started this this past Friday  symptoms have not for a total of 3 days currently in full. 08/16/2019 upon evaluation today patient's wound actually does show muscle in the base of the wound but in general does appear to be much better as far as the overall evidence of infection is concerned. In fact I feel like this is for the most part cleared up he still on the IV antibiotics he has not completed the full course yet but I think he is doing much better which is excellent news. 08/23/2019 upon evaluation today patient appears to be doing about the same with regard to his wound at this point. He tells me that he still has pain unfortunately. Fortunately there is no evidence of systemic infection at this time which is great news. There is significant muscle protrusion. 09/13/19 upon evaluation today patient appears to be doing about the same in regard to his leg unfortunately. He still has a lot of drainage coming from the ulceration there is still muscle exposed. With that being said the patient's last wound culture still showed an intermediate finding with regard to the Pseudomonas he still having the bluish/green drainage as well. Overall I do not know that the wound has completely cleared of infection at this point. Fortunately there is no signs of active infection systemically at this point which is good news. 09/20/2019 upon evaluation today patient's wound actually appears to be doing about the same based on what I am seeing currently. I do not see any signs of systemic infection he still does have evidence of some local infection and drainage. He did see Dr. Megan Salon last week and Dr. Megan Salon states that he probably does need a different IV antibiotic although he does not want to put him on this until the patient begins the Remicade infusion which is actually scheduled for about 10 days out from today on 13 May. Following that time Dr. Megan Salon is good to see him back and then will evaluate the feasibility of starting him on the  IV antibiotic therapy once again at that point. I do not disagree with this plan I do believe as Dr. Megan Salon stated in his note that I reviewed today that the patient's issue is multifactorial with the pyoderma being 1 aspect of this that were hoping the Remicade will be helpful for her. In the meantime I think the gentamicin is, helping to keep things under decent okay control in regard to the ulcer. 09/27/2019 upon evaluation today patient appears to be doing about the same with regard to his wound still there is a lot of muscle exposure though he does have some hyper granulation tissue noted around the edge and actually some granulation tissue starting to form over the muscle which is actually good news. Fortunately there is no evidence of active infection which is also good news. His pain is less at this point. 5/21; this is a patient I have not seen in a long time. 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 He has a large wound on the left lateral leg with protruding muscle. He comes in the clinic today showing the same area on his left medial ankle. He says there is been a spot there for some time although we have not previously defined this. Today he has a clearly defined area with slight amount of skin breakdown surrounded by raised areas with a purplish hue in color. This is not painful he says it is irritated.  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 He still has muscle protruding from the lateral portion of his left leg but fortunately the new area that they were concerned about at his last visit does not appear to have opened at this point. He is currently on Remicade infusions and seems to be doing better in my opinion in fact the wound itself seems to be overall much better. The purplish discoloration that he did have seems to have resolved  and I think that is a good sign that hopefully the Remicade is doing its job. He does have some biofilm noted over the surface of the wound. 11/01/2019 on evaluation today patient's wound actually appears to be doing excellent at this time. Fortunately there is no evidence of active infection and overall I feel like he is making great progress. The Remicade seems to be due excellent job in my opinion. JEQUAN, [name] ([id number]) 11/08/19 evaluation today vision actually appears to be doing quite well with regard to his weight ulcer. He's been tolerating dressing changes without complication. Fortunately there is no evidence of infection. No fevers, chills, nausea, or vomiting noted at this time. Overall states that is having more itching than pain which is actually a good sign in my opinion. 12/13/2019 upon evaluation today patient appears to be doing well today with regard to his wound. He has been tolerating the dressing changes without complication. Fortunately there is no sign of active infection at this time. No fevers, chills, nausea, vomiting, or diarrhea. Overall I feel like the infusion therapy has been very beneficial for him. 01/06/2020 on evaluation today patient appears to be doing well with regard to his wound. This is measuring smaller and actually looks to be doing better. Fortunately there is no signs of active infection at this point. No fevers, chills, nausea, vomiting, or diarrhea. With that being said he does still have the blue-green drainage but this does not seem to be causing any significant issues currently. He has been using the gentamicin that does seem to be keeping things under decent control at this point. He goes later this morning for his next infusion therapy for the pyoderma which seems to also be very beneficial. 02/07/2020 on evaluation today patient appears to be doing about the same in regard to his wounds currently. Fortunately there is no signs of active infection  systemically he does still have evidence of local infection still using gentamicin. He also is showing some signs of improvement albeit slowly I do feel like we are making some progress here. 02/21/2020 upon evaluation today patient appears to be making some signs of improvement the wound is measuring a little bit smaller which is great news and overall I am very pleased with where he stands currently. He is going to be having infusion therapy treatment on the 15th of this month. Fortunately there is no signs of active infection at this time. 03/13/2020 I do believe patient's wound is actually showing some signs of improvement here which is great news. He has continue with the infusion therapy through rheumatology/dermatology at [facility]. That does seem to be beneficial. I still think he gets as much benefit from this as he did from the prednisone initially but nonetheless obviously this is less harsh on his body that the prednisone as far as they are concerned. 03/31/2020 on evaluation today patient's wound actually showing signs of some pretty good improvement in regard  to the overall appearance of the wound bed. There is still muscle exposed though he does have some epithelial growth around the edges of the wound. Fortunately there is no signs of active infection at this time. No fevers, chills, nausea, vomiting, or diarrhea. 04/24/2020 upon evaluation today patient appears to be doing about the same in regard to his leg ulcer. He has been tolerating the dressing changes without complication. Fortunately there is no signs of active infection at this time. No fevers, chills, nausea, vomiting, or diarrhea. With that being said he still has a lot of irritation from the bandaging around the edges of the wound. We did discuss today the possibility of a referral to plastic surgery. 05/22/2020 on evaluation today patient appears to be doing well with regard to his wounds all things considered. He has not been able  to get the Chantix apparently there is a recall nurse that I was unaware of put out by [employer] involuntarily. Nonetheless for now I am and I have to do some research into what may be the best option for him to help with quitting in regard to smoking and we discussed that today. 06/26/2020 upon evaluation today patient appears to be doing well with regard to his wound from the standpoint of infection I do not see any signs of infection at this point. With that being said unfortunately he is still continuing to have issues with muscle exposure and again he is not having a whole lot of new skin growth unfortunately. There does not appear to be any signs of active infection at this time. No fevers, chills, nausea, vomiting, or diarrhea. 07/10/2020 upon evaluation today patient appears to be doing a little bit more poorly currently compared to where he was previous. I am concerned currently about an active infection that may be getting worse especially in light of the increased size and tenderness of the wound bed. No fevers, chills, nausea, vomiting, or diarrhea. 07/24/2020 upon evaluation today patient appears to be doing poorly in regard to his leg ulcer. He has been tolerating the dressing changes without complication but unfortunately is having a lot of discomfort. 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 Subsequently I think he is going require possibly IV antibiotics to get this under control. I am very concerned about the severity of his infection and the amount of discomfort he is having. 07/31/2020 upon evaluation today patient appears to be doing about the same in regard to his leg wound. He did see Dr. Megan Salon and Dr. Megan Salon is actually going to start him on IV antibiotics. He goes for the PICC line tomorrow. With that being said there do not have that run for 2 weeks and then see how things are doing and depending on how he  is progressing they may extend that a little longer. Nonetheless I am glad this is getting ready to be in place and definitely feel it may help the patient. In the meantime is been using mainly triamcinolone to the wound bed has an anti-inflammatory. 08/07/2020 on evaluation today patient appears to be doing well with regard to his wound compared even last week. In the interim he has gotten the PICC line placed and overall this seems to be doing excellent. There does not appear to be any evidence of infection which is great news systemically although locally of course has had the infection this appears to be improving with the use of the antibiotics. 08/14/2020 upon  evaluation today patient's wound actually showing signs of excellent improvement. Overall the irritation has significantly improved the drainage is back down to more of a normal level and his pain is really pretty much nonexistent compared to what it was. Obviously I think that this is significantly improved secondary to the IV antibiotic therapy which has made all the difference in the world. Again he had a resistant form of Pseudomonas for which oral antibiotics just was not cutting it. Nonetheless I do think that still we need to consider the possibility of a surgical closure for this wound is been open so long and to be honest with muscle exposed I think this can be very hard to get this to close outside of this although definitely were still working to try to do what we can in that regard. 08/21/2020 upon evaluation today patient appears to be doing very well with regard to his wounds on the left lateral lower extremity/calf area. Fortunately there does not appear to be signs of active infection which is great news and overall very pleased with where things stand today. He is actually wrapping up his treatment with IV antibiotics tomorrow. After that we will see where things go from there. 08/28/2020 upon evaluation today patient appears  to be doing decently well with regard to his leg ulcer. There does not appear to be any signs of active infection which is great news and overall very pleased with where things stand today. No fevers, chills, nausea, vomiting, or diarrhea. 09/18/2020 upon evaluation today patient appears to be doing well with regard to his infection which I feel like is better. Unfortunately he is not doing as well with regard to the overall size of the wound which is not nearly as good at this point. I feel like that he may be having an issue here with the pyoderma being somewhat out of control. I think that he may benefit from potentially going back and talking to the dermatologist about [name], BARBATO ([id number]) what to do from the pyoderma standpoint. I am not certain if the infusions are helping nearly as much is what the prednisone did in the past. 10/02/2020 upon evaluation today patient appears to be doing well with regard to his leg ulcer. He did go to the [facility] nurse. Unfortunately they feel like there is a 10% chance that most that he would be able to heal and that the skin graft would take. Obviously this has led him to not be able to go down that path as far as treatment is concerned. Nonetheless he does seem to be doing a little bit better with the prednisone that I gave him last time. I think that he may need to discuss with dermatology the possibility of long-term prednisone as that seems to be what is most helpful for him to be perfectly honest. I am not sure the Remicade is really doing the job. 10/17/2020 upon evaluation today patient appears to be doing a little better in regard to his wound. In fact the case has been since we did the prednisone on May 2 for him that we have noticed a little bit of improvement each time we have seen a size wise as well as appearance wise as well as pain wise. I think the prednisone has had a greater effect then the infusion therapy has to be perfectly honest. With  that being said the patient also feels significantly better compared to what he was previous. All of this is good news but  nonetheless I am still concerned about the fact that again we are really not set up to long-term manage him as far as prednisone is concerned. Obviously there are things that you need to be watched I completely understand the risk of prednisone usage as well. 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 With all that being said I do believe that we can give him another round of the prednisone which she is requesting today because of the improvement that he seen since we did that first round. 10/30/2020 upon evaluation today patient's wound actually is showing signs of doing quite well. There does not appear to be any evidence of infection which is great news and overall very pleased with where things stand today. No fevers, chills, nausea, vomiting, or diarrhea. He tells me that the prednisone still has seem to have helped he wonders if we can extend that for just a little bit longer. He did not have the appointment with a dermatologist although he did have an infusion appointment last Friday. That was at [facility]. With that being said he tells me he could not do both that as well as the appointment with the physician on the same day therefore that is can have to be rescheduled. I really want to see if there is anything they feel like that could be done differently to try to help this out as I am not really certain that the infusions are helping significantly here. 11/13/2020 upon evaluation today patient unfortunately appears to be doing somewhat poorly in regard to his wound I feel like this is actually worsening from the standpoint of the pyoderma spreading. I still feel like that he may need something different as far as trying to manage this going forward. Again we did the prednisone unfortunately his blood sugars are not doing so well  because of this. Nonetheless I believe that the patient likely needs to try topical steroid. We have done triamcinolone for a while I think going with something stronger such as clobetasol could be beneficial again this is not something I do lightly I discussed this with the patient that again this does not normally put underneath an occlusive dressing. Nonetheless I think a thin film as such could help with some of the stronger anti-inflammatory effects. We discussed this today. He would like to try to give this a trial for the next couple weeks. I definitely think that is something that we can do. Evaluate7/03/2021 and today patient's wound bed actually showed signs of doing really about the same. There was a little expansion of the size of the wound and that leading edge that we done looking out although the clobetasol does seem to have slowed this down a bit in my opinion. There is just 1 small area that still seems to be progressing based on what I see. Nonetheless I am concerned about the fact this does not seem to be improving if anything seems to be doing a little bit worse. I do not know that the infusions are really helping him much as next infusion is August 5 his appointment with dermatology is July 25. Either way I really think that we need to have a conversation potentially about this and I am actually going to see if I can talk with Dr. Lillia Carmel in order to see where things stand as well. 12/11/2020 upon evaluation today patient appears to be doing worse in regard to his leg ulcer. Unfortunately  I just do not think this is making the progress that I would like to see at this point. Honestly he does have an appointment with dermatology and this is in 2 days. I am wondering what they may have to offer to help with this. Right now what I am seeing is that he is continuing to show signs of worsening little by little. Obviously that is not great at all. Is the exact opposite of what we are  looking for. 12/18/2020 upon evaluation today patient appears to be doing a little better in regard to his wound. The dermatologist actually did do some steroid injections into the wound which does seem to have been beneficial in my opinion. That was on the 25th already this looks a little better to me than last time I saw him. With that being said we did do a culture and this did show that he has Staph aureus noted in abundance in the wound. With that being said I do think that getting him on an oral antibiotic would be appropriate as well. Also think we can compression wrap and this will make a difference as well. 12/28/2020 upon evaluation today patient's wound is actually showing signs of doing much better. I do believe the compression wrap is helping he has a lot of drainage but to be honest I think that the compression is helping to some degree in this regard as well as not draining through which is also good news. No fevers, chills, nausea, vomiting, or diarrhea. 01/04/2021 upon evaluation today patient appears to be doing well with regard to his wound. Overall things seem to be doing quite well. He did have a little bit of reaction to the CarboFlex Sorbact he will be using that any longer. With that being said he is controlled as far as the drainage is concerned overall and seems to be doing quite well. I do not see any signs of active infection at this time which is great news. No fevers, chills, nausea, vomiting, or diarrhea. 01/11/2021 upon evaluation today patient appears to be doing well with regard to his wounds. He has been tolerating the dressing changes without complication. Fortunately there does not appear to be any signs of active infection at this time which is great news. Overall I am extremely pleased with where we stand currently. No fevers, chills, nausea, vomiting, or diarrhea. Where using clobetasol in the wound bed he has a lot of new skin growth which is awesome as  well. 01/18/2021 upon evaluation today patient appears to be doing very well in regard to his leg ulcer. He has been tolerating the dressing changes without complication. Fortunately there does not appear to be any signs of active infection which is great news. In general I think that he is making excellent progress 01/25/2021 upon evaluation today patient appears to be doing well with regard to his wound on the leg. I am actually extremely pleased with where things stand today. There does not appear to be any signs of active infection which is great news and overall I think that we are definitely headed in the appropriate direction based on what I am seeing currently. There does not appear to be any signs of active infection also excellent news. 02/06/2021 upon evaluation today patient appears to be doing well with regard to his wound. Overall visually this is showing signs of significant improvement which is great news. I do not see any signs of active infection systemically which is great even locally  I do not think that we are seeing any major complications here. We did do fluorescence imaging with the [employer] today. The patient does have some odor and drainage noted and again this is something that I think would benefit him to probably come more frequently for nurse visits. 02/19/2021 upon evaluation today patient actually appears to be doing quite well in regard to his wound. He has been tolerating the dressing [name], [name] ([id number]) changes without complication and overall I think that this is making excellent progress. I do not see any evidence of active infection at this point which is great news as well. No fevers, chills, nausea, vomiting, or diarrhea. 10/10; wound is made nice progress healthy granulation with a nice rim of epithelialization which seems to be expanding even from last week he has a deeper area in the inferior part of the more distal part of the wound with not quite as  healthy as surface. This area will need to be followed. Using clobetasol and Hydrofera Blue 03/05/2021 upon evaluation today patient appears to be doing very well in regard to his leg ulcer. He has been tolerating dressing changes without complication. Fortunately there does not appear to be any signs of active infection which is great news and overall I am extremely pleased with where we stand currently. 03/12/2021 upon evaluation today patient appears to be doing well with regard to his wound in fact this is extremely extremely good based on what we are seeing today there does not appear to be any signs of active infection and overall I think that he is doing awesome from the standpoint of healing in general. I am extremely pleased with how things seem to be progressing with regard to this pyoderma. Clobetasol has done wonders for him. I think the compression wrapping has also been of great benefit. 03/19/2021 upon evaluation today patient appears to be doing well with regard to his wound. He is tolerating the dressing changes without complication. In fact I feel like that he is actually making excellent progress at this point based on what I am seeing. No fevers, chills, nausea, vomiting, or diarrhea. 03/26/2021 upon evaluation today patient appears to be doing well with regard to his wound. This again is measuring smaller and looking better. Again the progress is slow but nonetheless continual with what we have been seeing. I do believe that the current plan is doing awesome for him. 04/09/2021 upon evaluation today patient appears to be doing well with regard to his leg ulcer. This is showing signs of excellent improvement the muscle is completely closed over and there does not appear to be any evidence of inflammation at this point his drainage is significantly improved. Overall I think that he would be a good candidate for looking into a skin substitute at this point as well. We will get a look  into some approvals in that regard. Potentially TheraSkin as well as Apligraf could both be considered just depending on insurance coverage. 04/16/2021 upon evaluation today patient appears to be doing well at this point. He has been approved for the Apligraf which we could definitely order although I would like to try to get the TheraSkin approved if at all possible. I did fax notes into them today and I Georgina Peer try to give a call as well. Overall the wound appears to be doing decently well today. 04/20/2021 upon evaluation today patient actually appears to be doing quite well in regard to his wound with that being said we  are trying to see what we can do about speeding up the healing process. For that reason we did discuss the possibility of a skin substitute. We got the Apligraf approved. For that reason we will get a go ahead and see what we can do with the Apligraf at this point. I am still trying to get the TheraSkin approved but I have not heard anything from the insurance company yet I have called and talked to them earlier in the week and to be honest this was about half an hour that I spent on the phone they told me I would hear something within 10-15 business days. 04/27/2021 upon evaluation today patient appears to be doing excellent in regard to his wound. I do believe the Apligraf has been beneficial. With that being said he has had a little bit of increased pain has been a little bit concerned. I do want to go ahead and apply his steroid cream today the clobetasol and then put the Apligraf over I just do not think I want to risk not putting the clobetasol on based on what is been going on here currently. Patient voiced understanding. 05/04/2021 upon evaluation today patient is making excellent improvement. Overall there is definitely decrease in the size of the wound today and I am very pleased in that regard. I do not see any signs of active infection locally nor systemically at this  point. No fevers, chills, nausea, vomiting, or diarrhea. 05/11/2021 upon evaluation today patient appears to be doing well with regard to his wound. He has been tolerating Apligraf's without complication and is making excellent progress this is application #4 today. 12/30; patient here for Apligraf application. Appears to be doing well small wound there has been a lot of healing 05/28/2021 upon evaluation today patient appears to be doing well with regard to his wound. Has been tolerating the dressing changes without complication. Fortunately I do not see any signs of active infection locally nor systemically at this point which is great news. He is done with the Apligraf and the first round and overall this has filled in quite nicely I am not even certain he needs any additional at the moment. I would actually try to go back to clobetasol with [facility] which previously was doing well for him. 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 He is using clobetasol and Hydrofera Blue. Surface area of the wound is smaller and the surface looks very healthy. 06/11/2021 upon evaluation today patient actually appears to be showing signs of good improvement which is great news. Fortunately I do not see any signs of active infection at this time which is also excellent news. I do believe that the patient is doing well with the clobetasol and Hydrofera Blue. He does have some irritation and itching around the rest of the leg will use some triamcinolone over this area. Electronic Signature(s) Signed: 06/11/2021 8:52:42 AM By: Worthy Keeler PA-C Entered By: Worthy Keeler on 06/11/2021 08:52:42 RANARD, HARTE ([id number]) -------------------------------------------------------------------------------- Physical Exam Details Patient Name: [name] Date of Service: 06/11/2021 8:15 AM Medical Record Number: [id number] Patient Account Number: [id number] Date  of Birth/Sex: 1981-03-01 (40 y.o. M) Treating RN: Levora Dredge Primary Care Provider: [name] Other Clinician: Referring Provider: [name] Treating Provider/Extender: [name] Cliche in Treatment: 59 Constitutional Obese and well-hydrated in no acute distress. Respiratory normal breathing without difficulty. Psychiatric this patient is able to make decisions and demonstrates good insight into  disease process. Alert and Oriented x 3. pleasant and cooperative. Notes Upon inspection patient's wound bed actually showed signs of good granulation epithelization at this point. Fortunately there does not appear to be any evidence of active infection locally nor systemically at this time which is great news and overall I am extremely pleased with where we stand today. I do believe he is on the right track to getting this completely healed hopefully it will not be too much longer. Electronic Signature(s) Signed: 06/11/2021 8:53:06 AM By: Worthy Keeler PA-C Entered By: Worthy Keeler on 06/11/2021 08:53:06 Meddings, [name] ([id number]) -------------------------------------------------------------------------------- Physician Orders Details Patient Name: [name] Date of Service: 06/11/2021 8:15 AM Medical Record Number: [id number] Patient Account Number: [id number] Date of Birth/Sex: [birth year] (40 y.o. M) Treating RN: Levora Dredge Primary Care Provider: [name] Other Clinician: Referring Provider: [name] Treating Provider/Extender: [name] Cliche in Treatment: [phone] Verbal / Phone Orders: No Diagnosis Coding ICD-10 Coding Code Description I87.2 Venous insufficiency (chronic) (peripheral) L97.222 Non-pressure chronic ulcer of left calf with fat layer exposed E11.622 Type 2 diabetes mellitus with other skin ulcer L88 Pyoderma gangrenosum F17.208 Nicotine dependence, unspecified, with other nicotine-induced disorders Follow-up Appointments o  Return Appointment in 1 week. o Nurse Visit as needed - twice a week Bathing/ Shower/ Hygiene o Clean wound with Normal Saline or wound cleanser. o May shower with wound dressing protected with water repellent cover or cast protector. Edema Control - Lymphedema / Segmental Compressive Device / Other o Optional: One layer of unna paste to top of compression wrap (to act as an anchor). o Elevate, Exercise Daily and Avoid Standing for Long Periods of Time. o Elevate legs to the level of the heart and pump ankles as often as possible o Elevate leg(s) parallel to the floor when sitting. Wound Treatment Wound #1 - Lower Leg Wound Laterality: Left, Lateral Cleanser: Wound Cleanser 3 x Per Week/30 Days Discharge Instructions: Wash your hands with soap and water. Remove old dressing, discard into plastic bag and place into trash. Cleanse the wound with Wound Cleanser prior to applying a clean dressing using gauze sponges, not tissues or cotton balls. Do not scrub or use excessive force. Pat dry using gauze sponges, not tissue or cotton balls. Peri-Wound Care: Moisturizing Lotion 3 x Per Week/30 Days Discharge Instructions: Suggestions: Theraderm, Eucerin, Cetaphil, or patient preference. Topical: Clobetasol Propionate ointment 0.05%, 60 (g) tube 3 x Per Week/30 Days Discharge Instructions: Pt to bring to appt- Topical: Triamcinolone Acetonide Cream, 0.1%, 15 (g) tube 3 x Per Week/30 Days Discharge Instructions: Apply as directed by provider. To leg not on wound Primary Dressing: Hydrofera Blue Ready Transfer Foam, 4x5 (in/in) 3 x Per Week/30 Days Discharge Instructions: Apply Hydrofera Blue Ready to wound bed as directed Secondary Dressing: Zetuvit Plus Silicone Non-bordered 5x5 (in/in) 3 x Per Week/30 Days Compression Wrap: Medichoice 4 layer Compression System, 35-40 mmHG (Generic) 3 x Per Week/30 Days Discharge Instructions: Apply multi-layer wrap as directed. Electronic  Signature(s) Signed: 06/11/2021 4:34:41 PM By: Worthy Keeler PA-C Signed: 06/11/2021 4:44:26 PM By: Levora Dredge Entered By: Levora Dredge on 06/11/2021 08:59:56 [name], [name] ([id number]) [name], [name] ([id number]) -------------------------------------------------------------------------------- Problem List Details Patient Name: [name] Date of Service: 06/11/2021 8:15 AM Medical Record Number: [id number] Patient Account Number: [id number] Date of Birth/Sex: 11-25-1980 (41 y.o. M) Treating RN: Levora Dredge Primary Care Provider: [name] Other Clinician: Referring Provider: [name] Treating Provider/Extender: [name] Cliche in Treatment: 235 Active Problems ICD-10 Encounter Code Description Active  Date MDM Diagnosis I87.2 Venous insufficiency (chronic) (peripheral) 12/04/2016 No Yes L97.222 Non-pressure chronic ulcer of left calf with fat layer exposed 12/04/2016 No Yes E11.622 Type 2 diabetes mellitus with other skin ulcer 04/09/2021 No Yes L88 Pyoderma gangrenosum 03/26/2017 No Yes F17.208 Nicotine dependence, unspecified, with other nicotine-induced disorders 04/24/2020 No Yes Inactive Problems ICD-10 Code Description Active Date Inactive Date L97.213 Non-pressure chronic ulcer of right calf with necrosis of muscle 04/02/2017 04/02/2017 Resolved Problems ICD-10 Code Description Active Date Resolved Date L97.321 Non-pressure chronic ulcer of left ankle limited to breakdown of skin 10/08/2019 10/08/2019 L03.116 Cellulitis of left lower limb 05/24/2019 05/24/2019 Electronic Signature(s) Signed: 06/11/2021 8:18:07 AM By: Worthy Keeler PA-C Entered By: Worthy Keeler on 06/11/2021 08:18:06 [name] ([id number]) -------------------------------------------------------------------------------- Progress Note Details Patient Name: [name] Date of Service: 06/11/2021 8:15 AM Medical Record Number: [id number] Patient Account Number: [id number] Date of  Birth/Sex: 1981-04-27 (40 y.o. M) Treating RN: Levora Dredge Primary Care Provider: [name] Other Clinician: Referring Provider: [name] Treating Provider/Extender: [name] Cliche in Treatment: 80 Subjective Chief Complaint Information obtained from Patient He is here in follow up evaluation for LLE pyoderma ulcer History of Present Illness (HPI) 12/04/16; 41 year old man who comes into the clinic today for review of a wound on the posterior left calf. He tells me that is been there for about a year. He is not a diabetic he does smoke half a pack per day. He was seen in the ER on 11/20/16 felt to have cellulitis around the wound and was given clindamycin. An x-ray did not show osteomyelitis. The patient initially tells me that he has a milk allergy that sets off a pruritic itching rash on his lower legs which she scratches incessantly and he thinks that's what may have set up the wound. He has been using various topical antibiotics and ointments without any effect. He works in a [employer] and is on his feet all day. He does not have a prior history of wounds however he does have the rash on both lower legs the right arm and the ventral aspect of his left arm. These are excoriations and clearly have had scratching however there are of macular looking areas on both legs including a substantial larger area on the right leg. This does not have an underlying open area. There is no blistering. The patient tells me that 2 years ago in [place] in response to the rash on his legs he saw a dermatologist who told him he had a condition which may be pyoderma gangrenosum although I may be putting words into his mouth. He seemed to recognize this. On further questioning he admits to a 5 year history of quiesced. ulcerative colitis. He is not in any treatment for this. He's had no recent travel 12/11/16; the patient arrives today with his wound and roughly the same condition we've been  using silver alginate this is a deep punched out wound with some surrounding erythema but no tenderness. Biopsy I did did not show confirmed pyoderma gangrenosum suggested nonspecific inflammation and vasculitis but does not provide an actual description of what was seen by the pathologist. I'm really not able to understand this We have also received information from the patient's dermatologist in [place] notes from April 2016. This was a doctor Agarwal-antal. 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 He was prescribed topical steroid high potency under occlusion which helped but at this point the patient did not have a deep punched out  wound. 12/18/16; the patient's wound is larger in terms of surface area however this surface looks better and there is less depth. The surrounding erythema also is better. The patient states that the wrap we put on came off 2 days ago when he has been using his compression stockings. He we are in the process of getting a dermatology consult. 12/26/16 on evaluation today patient's left lower extremity wound shows evidence of infection with surrounding erythema noted. He has been tolerating the dressing changes but states that he has noted more discomfort. There is a larger area of erythema surrounding the wound. No fevers, chills, nausea, or vomiting noted at this time. With that being said the wound still does have slough covering the surface. He is not allergic to any medication that he is aware of at this point. In regard to his right lower extremity he had several regions that are erythematous and pruritic he wonders if there's anything we can do to help that. 01/02/17 I reviewed patient's wound culture which was obtained his visit last week. He was placed on doxycycline at that point. Unfortunately that does not appear to be an antibiotic that would likely help with the situation however the pseudomonas noted on culture is sensitive to Cipro.  Also unfortunately patient's wound seems to have a large compared to last week's evaluation. Not severely so but there are definitely increased measurements in general. He is continuing to have discomfort as well he writes this to be a seven out of 10. In fact he would prefer me not to perform any debridement today due to the fact that he is having discomfort and considering he has an active infection on the little reluctant to do so anyway. No fevers, chills, nausea, or vomiting noted at this time. 01/08/17; patient seems dermatology on September 5. I suspect dermatology will want the slides from the biopsy I did sent to their pathologist. I'm not sure if there is a way we can expedite that. In any case the culture I did before I left on vacation 3 weeks ago showed Pseudomonas he was given 10 days of Cipro and per her description of her intake nurses is actually somewhat better this week although the wound is quite a bit bigger than I remember the last time I saw this. He still has 3 more days of Cipro 01/21/17; dermatology appointment tomorrow. He has completed the ciprofloxacin for Pseudomonas. Surface of the wound looks better however he is had some deterioration in the lesions on his right leg. Meantime the left lateral leg wound we will continue with sample 01/29/17; patient had his dermatology appointment but I can't yet see that note. He is completed his antibiotics. The wound is more superficial but considerably larger in circumferential area than when he came in. This is in his left lateral calf. He also has swollen erythematous areas with superficial wounds on the right leg and small papular areas on both arms. There apparently areas in her his upper thighs and buttocks I did not look at those. Dermatology biopsied the right leg. Hopefully will have their input next week. 02/05/17; patient went back to see his dermatologist who told him that he had a "scratching problem" as well as staph. He is  now on a 30 day course of doxycycline and I believe she gave him triamcinolone cream to the right leg areas to help with the itching [not exactly sure but probably triamcinolone]. She apparently looked at the left lateral leg wound although this  was not rebiopsied and I think felt to be ultimately part of the same pathogenesis. He is using sample border foam and changing nevus himself. He now has a new open area on the right posterior leg which was his biopsy site I don't have any of the dermatology notes 02/12/17; we put the patient in compression last week with SANTYL to the wound on the left leg and the biopsy. Edema is much better and the depth of the wound is now at level of skin. Area is still the same Biopsy site on the right lateral leg we've also been using santyl with a border foam dressing and he is changing this himself. 02/19/17; Using silver alginate started last week to both the substantial left leg wound and the biopsy site on the right wound. He is tolerating compression well. Has a an appointment with his primary M.D. tomorrow wondering about diuretics although I'm wondering if the edema problem is actually lymphedema BEECHER, FURIO ([id number]) 02/26/17; the patient has been to see his primary doctor Dr. Jerrel Ivory at [facility] our primary care. She started him on Lasix 20 mg and this seems to have helped with the edema. However we are not making substantial change with the left lateral calf wound and inflammation. The biopsy site on the right leg also looks stable but not really all that different. 03/12/17; the patient has been to see vein and vascular Dr. Lucky Cowboy. He has had venous reflux studies I have not reviewed these. I did get a call from his dermatology office. They felt that he might have pathergy based on their biopsy on his right leg which led them to look at the slides of the biopsy I did on the left leg and they wonder whether this represents pyoderma gangrenosum which  was the original supposition in a man with ulcerative colitis albeit inactive for many years. They therefore recommended clobetasol and tetracycline i.e. aggressive treatment for possible pyoderma gangrenosum. 03/26/17; apparently the patient just had reflux studies not an appointment with Dr. dew. She arrives in clinic today having applied clobetasol for 2-3 weeks. He notes over the last 2-3 days excessive drainage having to change the dressing 3-4 times a day and also expanding erythema. 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 Lillia Carmel at [name] dermatology locally who kindly saw him at my request. A repeat biopsy apparently has confirmed pyoderma gangrenosum and he started on prednisone 60 mg yesterday. My concern was the degree of erythema medially extending from his left leg wound which was either inflammation from pyoderma or cellulitis. I put him on Augmentin however culture of the wound showed Pseudomonas which is quinolone sensitive. I really don't believe he has cellulitis however in view of everything I will continue and give him a course of Cipro. He is also on doxycycline as an immune modulator for the pyoderma. In addition to his original wound on the left lateral leg with surrounding erythema he has a wound on the right posterior calf which was an original biopsy site done by dermatology. This was felt to represent pathergy from pyoderma gangrenosum 04/16/17; pyoderma gangrenosum. Saw Dr. Lillia Carmel yesterday. He has been using topical antibiotics to both wound areas his original wound on the left and the biopsies/pathergy area  on the right. There is definitely some improvement in the inflammation around the wound on the  right although the patient states he has increasing sensitivity of the wounds. He is on prednisone 60 and doxycycline 1 as prescribed by Dr. Lillia Carmel. He is covering the topical antibiotic with gauze and putting this in his own compression stocks and changing this daily. He states that Dr. Lottie Rater did a culture of the left leg wound yesterday 05/07/17; pyoderma gangrenosum. The patient saw Dr. Lillia Carmel yesterday and has a follow-up with her in one month. He is still using topical antibiotics to both wounds although he can't recall exactly what type. He is still on prednisone 60 mg. Dr. Lillia Carmel stated that the doxycycline could stop if we were in agreement. 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 The right medial leg was induced by biopsy/pathergy. The area on the right is essentially healed. 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 The right medial leg has closed and remains closed. He is still on prednisone 60. He tells me he missed his last dermatology appointment with Dr. Lillia Carmel but will make another appointment. He reports that her blood sugar at a recent screen in [place] was high 200's. He was 180 today. 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 I think he is going to require still much longer prednisone perhaps another 3 months before attempting to taper. In the meantime his wound is a lot better. Smaller. He is cleaning this off daily and applying topical antibiotics. When he was last in the clinic I thought about changing to [facility] and actually put in a couple of calls to dermatology although probably not during their business hours. In any case the wound looks better smaller I don't think there is any need to change what he is doing 08/06/17-he is here in follow up  evaluation for pyoderma left leg ulcer. He continues on oral prednisone. He has been using triple antibiotic ointment. There is surface debris and we will transition to [facility] and have him return in 2 weeks. He has lost 30 pounds since his last appointment with lifestyle modification. He may benefit from topical steroid cream for treatment this can be considered at a later date. 08/22/17 on evaluation today patient appears to actually be doing rather well in regard to his left lateral lower extremity ulcer. He has actually been managed by Dr. Dellia Nims most recently. Patient is currently on oral steroids at this time. This seems to have been of benefit for him. Nonetheless his last visit was actually with Leah on 08/06/17. Currently he is not utilizing any topical steroid creams although this could be of benefit as well. No fevers, chills, nausea, or vomiting noted at this time. 09/05/17 on evaluation today patient appears to be doing better in regard to his left lateral lower extremity ulcer. He has been tolerating the dressing changes without complication. He is using Santyl with good effect. Overall I'm very pleased with how things are standing at this point. Patient likewise is happy that this is doing better. 09/19/17 on evaluation today patient actually appears to be doing rather well in regard to his left lateral lower extremity ulcer. 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 He's not having any  significant pain. 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 He has been tolerating the Santyl without complication and in general I feel like he's making good progress. 10/17/17 on evaluation today patient appears to be doing very well in regard to his left lateral lower surety ulcer. He has been tolerating the dressing  changes without complication. There does not appear to be any evidence of infection he's alternating the Santyl and the triple antibiotic ointment every other day this seems to be doing well for him. 11/03/17 on evaluation today patient appears to be doing very well in regard to his left lateral lower extremity ulcer. He is been tolerating the dressing changes without complication which is good news. Fortunately there does not appear to be any evidence of infection which is also great news. Overall is doing excellent they are starting to taper down on the prednisone is down to 40 mg at this point it also started topical clobetasol for him. 11/17/17 on evaluation today patient appears to be doing well in regard to his left lateral lower surety ulcer. He's been tolerating the dressing changes without complication. He does note that he is having no pain, no excessive drainage or discharge, and overall he feels like things are going about how he would expect and hope they would. Overall he seems to have no evidence of infection at this time in my opinion which is good news. 12/04/17-He is seen in follow-up evaluation for right lateral lower extremity ulcer. He has been applying topical steroid cream. Today's measurement show slight increase in size. Over the next 2 weeks we will transition to every other day Santyl and steroid cream. He has been encouraged to monitor for changes and notify clinic with any concerns 12/15/17 on evaluation today patient's left lateral motion the ulcer and fortunately is doing worse again at this point. This just since last week to this week has close to doubled in size according to the patient. I did not seeing last week's I do not have a visual to compare this to in our system was also down so we do not have all the charts and at this point. Nonetheless it does have me somewhat concerned in regard to the fact that again he was worried enough about it he has contact the  dermatology that placed them back on the full strength, 50 mg a day of the prednisone that he was taken previous. He continues to alternate using clobetasol along with Santyl at this point. He is obviously somewhat frustrated. JOHNSTON, MADDOCKS ([id number]) 12/22/17 on evaluation today patient appears to be doing a little worse compared to last evaluation. Unfortunately the wound is a little deeper and slightly larger than the last week's evaluation. With that being said he has made some progress in regard to the irritation surrounding at this time unfortunately despite that progress that's been made he still has a significant issue going on here. 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 For that reason I discussed with him today the possibility of perform a wound culture to ensure there's nothing overtly infected. 01/06/18 on evaluation today patient's wound is larger and deeper than previously evaluated. With that being said it did appear that his wound was infected after my last evaluation with him. Subsequently I  did end up prescribing a prescription for Bactrim DS which she has been taking and having no complication with. Fortunately there does not appear to be any evidence of infection at this point in time as far as anything spreading, no want to touch, and overall I feel like things are showing signs of improvement. 01/13/18 on evaluation today patient appears to be even a little larger and deeper than last time. There still muscle exposed in the base of the wound. Nonetheless he does appear to be less erythematous I do believe inflammation is calming down also believe the infection looks like it's probably resolved at this time based on what I'm seeing. No fevers, chills, nausea, or vomiting noted at this time. 01/30/18 on evaluation today patient  actually appears to visually look better for the most part. Unfortunately those visually this looks better he does seem to potentially have what may be an abscess in the muscle that has been noted in the central portion of the wound. This is the first time that I have noted what appears to be fluctuance in the central portion of the muscle. With that being said I'm somewhat more concerned about the fact that this might indicate an abscess formation at this location. I do believe that an ultrasound would be appropriate. This is likely something we need to try to do as soon as possible. He has been switch to mupirocin ointment and he is no longer using the steroid ointment as prescribed by dermatology he sees them again next week he's been decreased from 60 to 40 mg of prednisone. 03/09/18 on evaluation today patient actually appears to be doing a little better compared to last time I saw him. There's not as much erythema surrounding the wound itself. He I did review his most recent infectious disease note which was dated 02/24/18. He saw Dr. Michel Bickers in [place]. 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 Patient is now off of antibiotics and they are continually observing this. There seems to be no change in the past two weeks in my pinion based on what the patient says and what I see today compared to what Dr. Megan Salon likely saw two weeks ago. No fevers, chills, nausea, or vomiting noted at this time. 03/23/18 on evaluation today patient's wound actually appears to be showing signs of improvement which is good news. He is currently still on the Dapsone. He is also working on tapering the prednisone to get off of this and Dr. Lottie Rater is working with him in this regard. Nonetheless overall I feel like the wound is doing well it does appear based on the infectious disease note that I reviewed from Dr. 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 I did also review the results of his ultrasound of left lower extremity which revealed there was a dentist tissue in the base of the wound without an abscess noted. 04/06/18 on evaluation today the patient's left lateral lower extremity ulcer actually appears to be doing fairly well which is excellent news. There does not appear to be any evidence of infection at this time which is also great news. Overall he still does have a significantly large ulceration although little by little he seems to be making progress. He is down to 10 mg  a day of the prednisone. 04/20/18 on evaluation today patient actually appears to be doing excellent at this time in regard to his left lower extremity ulcer. He's making signs of good progress unfortunately this is taking much longer than we would really like to see but nonetheless he is making progress. Fortunately there does not appear to be any evidence of infection at this time. No fevers, chills, nausea, or vomiting noted at this time. The patient has not been using the Santyl due to the cost he hadn't got in this field yet. He's mainly been using the antibiotic ointment topically. Subsequently he also tells me that he really has not been scrubbing in the shower I think this would be helpful again as I told him it doesn't have to be anything too aggressive to even make it believe just enough to keep it free of some of the loose slough and biofilm on the wound surface. 05/11/18 on evaluation today patient's wound appears to be making slow but sure progress in regard to the left lateral lower extremity ulcer. He is been tolerating the dressing changes without complication. Fortunately there does not appear to be any evidence of infection at this time. He is still just using triple antibiotic ointment along with clobetasol  occasionally over the area. He never got the Santyl and really does not seem to intend to in my pinion. 06/01/18 on evaluation today patient appears to be doing a little better in regard to his left lateral lower extremity ulcer. He states that overall he does not feel like he is doing as well with the Dapsone as he did with the prednisone. Nonetheless he sees his dermatologist later today and is gonna talk to them about the possibility of going back on the prednisone. Overall again I believe that the wound would be better if you would utilize Santyl but he really does not seem to be interested in going back to the [place] at this point. He has been using triple antibiotic ointment. 06/15/18 on evaluation today patient's wound actually appears to be doing about the same at this point. Fortunately there is no signs of infection at this time. He has made slight improvements although he continues to not really want to clean the wound bed at this point. He states that he just doesn't mess with it he doesn't want to cause any problems with everything else he has going on. He has been on medication, antibiotics as prescribed by his dermatologist, for a staff infection of his lower extremities which is really drying out now and looking much better he tells me. Fortunately there is no sign of overall infection. 06/29/18 on evaluation today patient appears to be doing well in regard to his left lateral lower surety ulcer all things considering. Fortunately his staff infection seems to be greatly improved compared to previous. He has no signs of infection and this is drying up quite nicely. He is still the doxycycline for this is no longer on cental, Dapsone, or any of the other medications. His dermatologist has recommended possibility of an infusion but right now he does not want to proceed with that. 07/13/18 on evaluation today patient appears to be doing about the same in regard to his left lateral lower surety  ulcer. Fortunately there's no signs of infection at this time which is great news. Unfortunately he still builds up a significant amount of Slough/biofilm of the surface of the wound he still is not really cleaning this as he should  be appropriately. Again I'm able to easily with saline and gauze remove the majority of this on the surface which if you would do this at home would likely be a dramatic improvement for him as far as getting the area to improve. Nonetheless overall I still feel like he is making progress is just very slow. I think Santyl will be of benefit for him as well. Still he has not gotten this as of this point. 07/27/18 on evaluation today patient actually appears to be doing little worse in regards of the erythema around the periwound region of the wound he also tells me that he's been having more drainage currently compared to what he was experiencing last time I saw him. He states not quite as bad as what he had because this was infected previously but nonetheless is still appears to be doing poorly. Fortunately there is no evidence of systemic infection at this point. The patient tells me that he is not going to be able to afford the Santyl. He is still waiting to hear about the infusion therapy with his dermatologist. Apparently she wants an updated colonoscopy first. LANARD, ARGUIJO ([id number]) 08/10/18 on evaluation today patient appears to be doing better in regard to his left lateral lower extremity ulcer. Fortunately he is showing signs of improvement in this regard he's actually been approved for Remicade infusion's as well although this has not been scheduled as of yet. Fortunately there's no signs of active infection at this time in regard to the wound although he is having some issues with infection of the right lower extremity is been seen as dermatologist for this. Fortunately they are definitely still working with him trying to keep things under control. 09/07/18 on  evaluation today patient is actually doing rather well in regard to his left lateral lower extremity ulcer. He notes these actually having some hair grow back on his extremity which is something he has not seen in years. He also tells me that the pain is really not giving them any trouble at this time which is also good news overall she is very pleased with the progress he's using a combination of the mupirocin along with the probate is all mixed. 09/21/18 on evaluation today patient actually appears to be doing fairly well all things considered in regard to his looks from the ulcer. He's been tolerating the dressing changes without complication. Fortunately there's no signs of active infection at this time which is good news he is still on all antibiotics or prevention of the staff infection. He has been on prednisone for time although he states it is gonna contact his dermatologist and see if she put them on a short course due to some irritation that he has going on currently. Fortunately there's no evidence of any overall worsening this is going very slow I think cental would be something that would be helpful for him although he states that $50 for tube is quite expensive. He therefore is not willing to get that at this point. 10/06/18 on evaluation today patient actually appears to be doing decently well in regard to his left lateral leg ulcer. He's been tolerating the dressing changes without complication. Fortunately there's no signs of active infection at this time. Overall I'm actually rather pleased with the progress he's making although it's slow he doesn't show any signs of infection and he does seem to be making some improvement. I do believe that he may need a switch up and dressings to try to  help this to heal more appropriately and quickly. 10/19/18 on evaluation today patient actually appears to be doing better in regard to his left lateral lower extremity ulcer. This is shown signs  of having much less Slough buildup at this point due to the fact he has been using the [employer]. Obviously this is very good news. The overall size of the wound is not dramatically smaller but again the appearance is. 11/02/18 on evaluation today patient actually appears to be doing quite well in regard to his lower [place] ulcer. A lot of the skin around the ulcer is actually somewhat irritating at this point this seems to be more due to the dressing causing irritation from the adhesive that anything else. Fortunately there is no signs of active infection at this time. 11/24/18 on evaluation today patient appears to be doing a little worse in regard to his overall appearance of his lower extremity ulcer. There's more erythema and warmth around the wound unfortunately. He is currently on doxycycline which he has been on for some time. With that being said I'm not sure that seems to be helping with what appears to possibly be an acute cellulitis with regard to his left lower extremity ulcer. No fevers, chills, nausea, or vomiting noted at this time. 12/08/18 on evaluation today patient's wounds actually appears to be doing significantly better compared to his last evaluation. He has been using Santyl along with alternating tripling about appointment as well as the steroid cream seems to be doing quite well and the wound is showing signs of improvement which is excellent news. Fortunately there's no evidence of infection and in fact his culture came back negative with only normal skin flora noted. 12/21/2018 upon evaluation today patient actually appears to be doing excellent with regard to his ulcer. This is actually the best that I have seen it since have been helping to take care of him. It is both smaller as well as less slough noted on the surface of the wound and seems to be showing signs of good improvement with new skin growing from the edges. He has been using just the triamcinolone he does wonder if  he can get a refill of that ointment today. 01/04/2019 upon evaluation today patient actually appears to be doing well with regard to his left lateral lower extremity ulcer. With that being said it does not appear to be that he is doing quite as well as last time as far as progression is concerned. There does not appear to be any signs of infection or significant irritation which is good news. With that being said I do believe that he may benefit from switching to a collagen based dressing based on how clean The wound appears. 01/18/2019 on evaluation today patient actually appears to be doing well with regard to his wound on the left lower extremity. He is not made a lot of progress compared to where we were previous but nonetheless does seem to be doing okay at this time which is good news. There is no signs of active infection which is also good news. My only concern currently is I do wish we can get him into utilizing the collagen dressing his insurance would not pay for the supplies that we ordered although it appears that he may be able to order this through his supply company that he typically utilizes. This is [employer]. Nonetheless he did try to order it during the office visit today and it appears this did go through. We  will see if he can get that it is a different brand but nonetheless he has collagen and I do think will be beneficial. 02/01/2019 on evaluation today patient actually appears to be doing a little worse today in regard to the overall size of his wounds. Fortunately there is no signs of active infection at this time. That is visually. Nonetheless when this is happened before it was due to infection. For that reason were somewhat concerned about that this time as well. 02/08/2019 on evaluation today patient unfortunately appears to be doing slightly worse with regard to his wound upon evaluation today. Is measuring a little deeper and a little larger unfortunately. I am not really  sure exactly what is causing this to enlarge he actually did see his dermatologist she is going to see about initiating Humira for him. Subsequently she also did do steroid injections into the wound itself in the periphery. Nonetheless still nonetheless he seems to be getting a little bit larger he is gone back to just using the steroid cream topically which I think is appropriate. I would say hold off on the collagen for the time being is definitely a good thing to do. Based on the culture results which we finally did get the final result back regarding it shows staph as the bacteria noted again that can be a normal skin bacteria based on the fact however he is having increased drainage and worsening of the wound measurement wise I would go ahead and place him on an antibiotic today I do believe for this. 02/15/2019 on evaluation today patient actually appears to be doing somewhat better in regard to his ulcer. There is no signs of worsening at this time I did review his culture results which showed evidence of Staphylococcus aureus but not MRSA. Again this could just be more related to the normal skin bacteria although he states the drainage has slowed down quite a bit he may have had a mild infection not just colonization. And was much smaller and then since around10/04/2019 on evaluation today patient appears to be doing unfortunately worse as far as the size of the wound. I really feel like that this is steadily getting larger again it had been doing excellent right at the beginning of September we have seen a steady increase in the area of the wound it is almost 2-1/2 times the size it was on September 1. Obviously this is a bad trend this is not wanting to see. For that reason we went back to using just the topical triamcinolone cream which does seem to help with inflammation. I checked him for bacteria by way of culture and nothing showed positive there. I am considering giving him a short  course of a tapering steroid Kolter Reaver, [name] ([id number]) today to see if that is can be beneficial for him. The patient is in agreement with giving that a try. 03/08/2019 on evaluation today patient appears to be doing very well in comparison to last evaluation with regard to his lower extremity ulcer. This is showing signs of less inflammation and actually measuring slightly smaller compared to last time every other week over the past month and a half he has been measuring larger larger larger. Nonetheless I do believe that the issue has been inflammation the prednisone does seem to have been beneficial for him which is good news. No fevers, chills, nausea, vomiting, or diarrhea. 03/22/2019 on evaluation today patient appears to be doing about the same with regard to his  leg ulcer. He has been tolerating the dressing changes without complication. With that being said the wound seems to be mostly arrested at its current size but really is not making any progress except for when we prescribed the prednisone. He did show some signs of dropping as far as the overall size of the wound during that interval week. Nonetheless this is something he is not on long-term at this point and unfortunately I think he is getting need either this or else the Humira which his dermatologist has discussed try to get approval for. With that being said he will be seeing his dermatologist on the 11th of this month that is November. 04/19/2019 on evaluation today patient appears to be doing really about the same the wound is measuring slightly larger compared to last time I saw him. He has not been into the office since November 2 due to the fact that he unfortunately had Covid as that his entire family. He tells me that it was rough but they did pull-through and he seems to be doing much better. Fortunately there is no signs of active infection at this time. No fevers, chills, nausea, vomiting, or diarrhea. 05/10/2019  on evaluation today patient unfortunately appears to be doing significantly worse as compared to last time I saw him. He does tell me that he has had his first dose of Humira and actually is scheduled to get the next one in the upcoming week. With that being said he tells me also that in the past several days he has been having a lot of issues with green drainage she showed me a picture this is more blue-green in color. He is also been having issues with increased sloughy buildup and the wound does appear to be larger today. Obviously this is not the direction that we want everything to take based on the starting of his Humira. Nonetheless I think this is definitely a result of likely infection and to be honest I think this is probably Pseudomonas causing the infection based on what I am seeing. 05/24/2019 on evaluation today patient unfortunately appears to be doing significantly worse compared to his prior evaluation with me 2 weeks ago. I did review his culture results which showed that he does have Staph aureus as well as Pseudomonas noted on the culture. Nonetheless the Levaquin that I prescribed for him does not appear to have been appropriate and in fact he tells me he is no longer experiencing the green drainage and discharge that he had at the last visit. Fortunately there is no signs of active infection at this time which is good news although the wound has significantly worsened it in fact is much deeper than it was previous. We have been utilizing up to this point triamcinolone ointment as the prescription topical of choice but at this time I really feel like that the wound is getting need to be packed in order to appropriately manage this due to the deeper nature of the wound. Therefore something along the lines of an alginate dressing may be more appropriate. 05/31/2019 upon inspection today patient's wound actually showed signs of doing poorly at this point. Unfortunately he just does not  seem to be making any good progress despite what we have tried. He actually did go ahead and pick up the Cipro and start taking that as he was noticing more green drainage he had previously completed the Levaquin that I prescribed for him as well. Nonetheless he missed his appointment for the  seventh last week on Wednesday with the wound care center and [facility] where his dermatologist referred him. Obviously I do think a second opinion would be helpful at this point especially in light of the fact that the patient seems to be doing so poorly despite the fact that we have tried everything that I really know how at this point. The only thing that ever seems to have helped him in the past is when he was on high doses of continual steroids that did seem to make a difference for him. 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 06/07/2019 upon evaluation today patient unfortunately appears to be doing worse yet again with regard to his wound. In fact I am starting to question whether or not he may have a fluid pocket in the muscle at this point based on the bulging and the soft appearance to the central portion of the muscle area. There is not anything draining from the muscle itself at this time which is good news but nonetheless the wound is expanding. I am not really seeing any results of the Humira as far as overall wound progression based on what I am seeing at this point. The patient has been referred for second opinion with regard to his wound to the [facility] wound care center by his dermatologist which I definitely am not in opposition to. Unfortunately we tried multiple dressings in the past including collagen, alginate, and at one point even Hydrofera Blue. With that being said he is never really used it for any significant amount of time  due to the fact that he often complains of pain associated with these dressings and then will go back to either using the Santyl which she has done intermittently or more frequently the triamcinolone. He is also using his own compression stockings. We have wrapped him in the past but again that was something else that he really was not a big fan of. 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 He seemed to do much better when he was on the steroids although honestly I understand that the steroids are not necessarily the best medication to be on long-term obviously 06/14/2019 on evaluation today patient appears to be doing actually a little bit better with regard to the overall appearance with his leg. Unfortunately he does continue to have issues with what appears to be some fluid underneath the muscle although he did see the wound specialty center at [facility] last week their main goals were to see about infusion therapy in place of the Humira as they feel like that is not quite strong enough. They also recommended that we continue with the treatment otherwise as we are they felt like that was appropriate and they are okay with him continuing to follow-up here with [place] in that regard. With that being said they are also sending him to the vein specialist there to see about vein stripping and if that would be of  benefit for him. Subsequently they also did not really address whether or not an ultrasound of the muscle area to see if there is anything that needs to be addressed here would be appropriate or not. For that reason I discussed this with him last week I think we may proceed down that  road at this point. 06/21/2019 upon evaluation today patient's wound actually appears to be doing slightly better compared to previous evaluations. I do believe that he has made a difference with regard to the progression here with the use of oral steroids. Again in the past has been the only thing that is really calm things down. He does tell me that from [facility] is gotten a good news from there that there are no further vein stripping that is necessary at this point. I do not have that available for review today although the patient did relay this to me. He also did obtain and have the ultrasound of the wound completed which I did sign off on today. It does appear that there is no fluid collection under the muscle this is likely then just edematous tissue in general. That is also good news. Overall I still believe the inflammation is the main issue here. He did inquire about the possibility of a wound VAC again with the muscle protruding like it is I am not really sure whether the wound VAC is necessarily ideal or not. That is something we will have to consider although I do believe he may need compression wrapping to try to help with edema control which could potentially be of benefit. 06/28/2019 on evaluation today patient appears to be doing slightly better measurement wise although this is not terribly smaller he least seems to be trending towards that direction. With that being said he still seems to have purulent drainage noted in the wound bed at this time. He has been on Levaquin followed by Cipro over the past month. Unfortunately he still seems to have some issues with active infection at this time. I did perform a culture last week in order to evaluate and see if indeed there was still anything going on. Subsequently the culture did come back Wynder, Darcey ([id number]) showing Pseudomonas which is consistent with the drainage has been having which is blue-green in color. He also has had an odor  that again was somewhat consistent with Pseudomonas as well. Long story short it appears that the culture showed an intermediate finding with regard to how well the Cipro will work for the Pseudomonas infection. Subsequently being that he does not seem to be clearing up and at best what we are doing is just keeping this at [place] I think he may need to see infectious disease to discuss IV antibiotic options. 07/05/2019 upon evaluation today patient appears to be doing okay in regard to his leg ulcer. He has been tolerating the dressing changes at this point without complication. Fortunately there is no signs of active infection at this time which is good news. No fevers, chills, nausea, vomiting, or diarrhea. With that being said he does have an appointment with infectious disease tomorrow and his primary care on Wednesday. Again the reason for the infectious disease referral was due to the fact that he did not seem to be fully resolving with the use of oral antibiotics and therefore we were thinking that IV antibiotic therapy may be necessary secondary to the fact that there was an intermediate finding for how effective the Cipro may be. Nonetheless again he  has been having a lot of purulent and even green drainage. Fortunately right now that seems to have calmed down over the past week with the reinitiation of the oral antibiotic. Nonetheless we will see what Dr. Megan Salon has to say. 07/12/2019 upon evaluation today patient appears to be doing about the same at this point in regard to his left lower extremity ulcer. Fortunately there is no signs of active infection at this time which is good news I do believe the Levaquin has been beneficial I did review Dr. Hale Bogus note and to be honest I agree that the patient's leg does appear to be doing better currently. What we found in the past as he does not seem to really completely resolve he will stop the antibiotic and then subsequently things will revert  back to having issues with blue-green drainage, increased pain, and overall worsening in general. Obviously that is the reason I sent him back to infectious disease. 07/19/2019 upon evaluation today patient appears to be doing roughly the same in size there is really no dramatic improvement. He has started back on the Levaquin at this point and though he seems to be doing okay he did still have a lot of blue/green drainage noted on evaluation today unfortunately. I think that this is still indicative more likely of a Pseudomonas infection as previously noted and again he does see Dr. Megan Salon in just a couple of days. I do not know that were really able to effectively clear this with just oral antibiotics alone based on what I am seeing currently. Nonetheless we are still continue to try to manage as best we can with regard to the patient and his wound. I do think the wrap was helpful in decreasing the edema which is excellent news. No fevers, chills, nausea, vomiting, or diarrhea. 07/26/2019 upon evaluation today patient appears to be doing slightly better with regard to the overall appearance of the muscle there is no dark discoloration centrally. Fortunately there is no signs of active infection at this time. No fevers, chills, nausea, vomiting, or diarrhea. Patient's wound bed currently the patient did have an appointment with Dr. Megan Salon at infectious disease last week. With that being said Dr. Megan Salon the patient states was still somewhat hesitant about put him on any IV antibiotics he wanted [place] to repeat cultures today and then see where things go going forward. He does look like Dr. Megan Salon because of some improvement the patient did have with the Levaquin wanted [place] to see about repeating cultures. If it indeed grows the Pseudomonas again then he recommended a possibility of considering a PICC line placement and IV antibiotic therapy. He plans to see the patient back in 1 to 2 weeks. 08/02/2019  upon evaluation today patient appears to be doing poorly with regard to his left lower extremity. We did get the results of his culture back it shows that he is still showing evidence of Pseudomonas which is consistent with the purulent/blue-green drainage that he has currently. Subsequently the culture also shows that he now is showing resistance to the oral fluoroquinolones which is unfortunate as that was really the only thing to treat the infection prior. I do believe that he is looking like this is going require IV antibiotic therapy to get this under control. Fortunately there is no signs of systemic infection at this time which is good news. The patient does see Dr. Megan Salon tomorrow. 08/09/2019 upon evaluation today patient appears to be doing better with regard to his  left lower extremity ulcer in regard to the overall appearance. He is currently on IV antibiotic therapy. As ordered by Dr. Megan Salon. Currently the patient is on ceftazidime which she is going to take for the next 2 weeks and then follow-up for 4 to 5-week appointment with Dr. Megan Salon. The patient started this this past Friday symptoms have not for a total of 3 days currently in full. 08/16/2019 upon evaluation today patient's wound actually does show muscle in the base of the wound but in general does appear to be much better as far as the overall evidence of infection is concerned. In fact I feel like this is for the most part cleared up he still on the IV antibiotics he has not completed the full course yet but I think he is doing much better which is excellent news. 08/23/2019 upon evaluation today patient appears to be doing about the same with regard to his wound at this point. He tells me that he still has pain unfortunately. Fortunately there is no evidence of systemic infection at this time which is great news. There is significant muscle protrusion. 09/13/19 upon evaluation today patient appears to be doing about the same  in regard to his leg unfortunately. He still has a lot of drainage coming from the ulceration there is still muscle exposed. With that being said the patient's last wound culture still showed an intermediate finding with regard to the Pseudomonas he still having the bluish/green drainage as well. Overall I do not know that the wound has completely cleared of infection at this point. Fortunately there is no signs of active infection systemically at this point which is good news. 09/20/2019 upon evaluation today patient's wound actually appears to be doing about the same based on what I am seeing currently. I do not see any signs of systemic infection he still does have evidence of some local infection and drainage. He did see Dr. Megan Salon last week and Dr. Megan Salon states that he probably does need a different IV antibiotic although he does not want to put him on this until the patient begins the Remicade infusion which is actually scheduled for about 10 days out from today on 13 May. Following that time Dr. Megan Salon is good to see him back and then will evaluate the feasibility of starting him on the IV antibiotic therapy once again at that point. I do not disagree with this plan I do believe as Dr. Megan Salon stated in his note that I reviewed today that the patient's issue is multifactorial with the pyoderma being 1 aspect of this that were hoping the Remicade will be helpful for her. In the meantime I think the gentamicin is, helping to keep things under decent okay control in regard to the ulcer. 09/27/2019 upon evaluation today patient appears to be doing about the same with regard to his wound still there is a lot of muscle exposure though he does have some hyper granulation tissue noted around the edge and actually some granulation tissue starting to form over the muscle which is actually good news. Fortunately there is no evidence of active infection which is also good news. His pain is less at this  point. 5/21; this is a patient I have not seen in a long time. 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 He has a large wound on the left lateral leg with protruding muscle. He comes in the clinic today showing the same area on his left medial ankle.  He says there is been a spot there for some time although we have not previously defined this. Today he has a clearly defined area with slight amount of skin breakdown surrounded by raised areas with a purplish hue in color. This is not painful he says it is irritated. 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 He still has muscle protruding from the lateral portion of his left leg but fortunately the new area that they were concerned about at his last visit does not appear to have opened at this point. He is currently on Remicade infusions and seems to be doing better in my opinion in fact the wound itself seems to be overall much better. The purplish discoloration that he did have seems to have resolved and I think that is a good sign that hopefully the Remicade is doing its job. He does Keil, [name] ([id number]) have some biofilm noted over the surface of the wound. 11/01/2019 on evaluation today patient's wound actually appears to be doing excellent at this time. Fortunately there is no evidence of active infection and overall I feel like he is making great progress. The Remicade seems to be due excellent job in my opinion. 11/08/19 evaluation today vision actually appears to be doing quite well with regard to his weight ulcer. He's been tolerating dressing changes without complication. Fortunately there is no evidence of infection. No fevers, chills, nausea, or vomiting noted at this time. Overall states that is having more itching than pain which is actually a good sign in my opinion. 12/13/2019 upon  evaluation today patient appears to be doing well today with regard to his wound. He has been tolerating the dressing changes without complication. Fortunately there is no sign of active infection at this time. No fevers, chills, nausea, vomiting, or diarrhea. Overall I feel like the infusion therapy has been very beneficial for him. 01/06/2020 on evaluation today patient appears to be doing well with regard to his wound. This is measuring smaller and actually looks to be doing better. Fortunately there is no signs of active infection at this point. No fevers, chills, nausea, vomiting, or diarrhea. With that being said he does still have the blue-green drainage but this does not seem to be causing any significant issues currently. He has been using the gentamicin that does seem to be keeping things under decent control at this point. He goes later this morning for his next infusion therapy for the pyoderma which seems to also be very beneficial. 02/07/2020 on evaluation today patient appears to be doing about the same in regard to his wounds currently. Fortunately there is no signs of active infection systemically he does still have evidence of local infection still using gentamicin. He also is showing some signs of improvement albeit slowly I do feel like we are making some progress here. 02/21/2020 upon evaluation today patient appears to be making some signs of improvement the wound is measuring a little bit smaller which is great news and overall I am very pleased with where he stands currently. He is going to be having infusion therapy treatment on the 15th of this month. Fortunately there is no signs of active infection at this time. 03/13/2020 I do believe patient's wound is actually showing some signs of improvement here which is great news. He has continue with the infusion therapy through rheumatology/dermatology at [facility]. That does seem to be beneficial. I still think  he gets as much benefit  from this as he did from the prednisone initially but nonetheless obviously this is less harsh on his body that the prednisone as far as they are concerned. 03/31/2020 on evaluation today patient's wound actually showing signs of some pretty good improvement in regard to the overall appearance of the wound bed. There is still muscle exposed though he does have some epithelial growth around the edges of the wound. Fortunately there is no signs of active infection at this time. No fevers, chills, nausea, vomiting, or diarrhea. 04/24/2020 upon evaluation today patient appears to be doing about the same in regard to his leg ulcer. He has been tolerating the dressing changes without complication. Fortunately there is no signs of active infection at this time. No fevers, chills, nausea, vomiting, or diarrhea. With that being said he still has a lot of irritation from the bandaging around the edges of the wound. We did discuss today the possibility of a referral to plastic surgery. 05/22/2020 on evaluation today patient appears to be doing well with regard to his wounds all things considered. He has not been able to get the Chantix apparently there is a recall nurse that I was unaware of put out by [employer] involuntarily. Nonetheless for now I am and I have to do some research into what may be the best option for him to help with quitting in regard to smoking and we discussed that today. 06/26/2020 upon evaluation today patient appears to be doing well with regard to his wound from the standpoint of infection I do not see any signs of infection at this point. With that being said unfortunately he is still continuing to have issues with muscle exposure and again he is not having a whole lot of new skin growth unfortunately. There does not appear to be any signs of active infection at this time. No fevers, chills, nausea, vomiting, or diarrhea. 07/10/2020 upon evaluation today patient appears to be doing a little bit  more poorly currently compared to where he was previous. I am concerned currently about an active infection that may be getting worse especially in light of the increased size and tenderness of the wound bed. No fevers, chills, nausea, vomiting, or diarrhea. 07/24/2020 upon evaluation today patient appears to be doing poorly in regard to his leg ulcer. He has been tolerating the dressing changes without complication but unfortunately is having a lot of discomfort. 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 Subsequently I think he is going require possibly IV antibiotics to get this under control. I am very concerned about the severity of his infection and the amount of discomfort he is having. 07/31/2020 upon evaluation today patient appears to be doing about the same in regard to his leg wound. He did see Dr. Megan Salon and Dr. Megan Salon is actually going to start him on IV antibiotics. He goes for the PICC line tomorrow. With that being said there do not have that run for 2 weeks and then see how things are doing and depending on how he is progressing they may extend that a little longer. Nonetheless I am glad this is getting ready to be in place and definitely feel it may help the patient. In the meantime is been using mainly triamcinolone to the wound bed has an anti-inflammatory. 08/07/2020 on evaluation today patient appears to be doing well with regard to his wound compared even last week. In the interim he has  gotten the PICC line placed and overall this seems to be doing excellent. There does not appear to be any evidence of infection which is great news systemically although locally of course has had the infection this appears to be improving with the use of the antibiotics. 08/14/2020 upon evaluation today patient's wound actually showing signs of excellent improvement. Overall the irritation has significantly improved the  drainage is back down to more of a normal level and his pain is really pretty much nonexistent compared to what it was. Obviously I think that this is significantly improved secondary to the IV antibiotic therapy which has made all the difference in the world. Again he had a resistant form of Pseudomonas for which oral antibiotics just was not cutting it. Nonetheless I do think that still we need to consider the possibility of a surgical closure for this wound is been open so long and to be honest with muscle exposed I think this can be very hard to get this to close outside of this although definitely were still working to try to do what we can in that regard. 08/21/2020 upon evaluation today patient appears to be doing very well with regard to his wounds on the left lateral lower extremity/calf area. Fortunately there does not appear to be signs of active infection which is great news and overall very pleased with where things stand today. He is actually wrapping up his treatment with IV antibiotics tomorrow. After that we will see where things go from there. 08/28/2020 upon evaluation today patient appears to be doing decently well with regard to his leg ulcer. There does not appear to be any signs of Alarie, Oneal ([id number]) active infection which is great news and overall very pleased with where things stand today. No fevers, chills, nausea, vomiting, or diarrhea. 09/18/2020 upon evaluation today patient appears to be doing well with regard to his infection which I feel like is better. Unfortunately he is not doing as well with regard to the overall size of the wound which is not nearly as good at this point. I feel like that he may be having an issue here with the pyoderma being somewhat out of control. I think that he may benefit from potentially going back and talking to the dermatologist about what to do from the pyoderma standpoint. I am not certain if the infusions are helping nearly as much is  what the prednisone did in the past. 10/02/2020 upon evaluation today patient appears to be doing well with regard to his leg ulcer. He did go to the Psychiatric nurse. Unfortunately they feel like there is a 10% chance that most that he would be able to heal and that the skin graft would take. Obviously this has led him to not be able to go down that path as far as treatment is concerned. Nonetheless he does seem to be doing a little bit better with the prednisone that I gave him last time. I think that he may need to discuss with dermatology the possibility of long-term prednisone as that seems to be what is most helpful for him to be perfectly honest. I am not sure the Remicade is really doing the job. 10/17/2020 upon evaluation today patient appears to be doing a little better in regard to his wound. In fact the case has been since we did the prednisone on May 2 for him that we have noticed a little bit of improvement each time we have seen a size wise as well  as appearance wise as well as pain wise. I think the prednisone has had a greater effect then the infusion therapy has to be perfectly honest. With that being said the patient also feels significantly better compared to what he was previous. All of this is good news but nonetheless I am still concerned about the fact that again we are really not set up to long-term manage him as far as prednisone is concerned. Obviously there are things that you need to be watched I completely understand the risk of prednisone usage as well. 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 With all that being said I do believe that we can give him another round of the prednisone which she is requesting today because of the improvement that he seen since we did that first round. 10/30/2020 upon evaluation today patient's wound actually is showing signs of doing quite well. There does not appear to be any evidence  of infection which is great news and overall very pleased with where things stand today. No fevers, chills, nausea, vomiting, or diarrhea. He tells me that the prednisone still has seem to have helped he wonders if we can extend that for just a little bit longer. He did not have the appointment with a dermatologist although he did have an infusion appointment last Friday. That was at [facility]. With that being said he tells me he could not do both that as well as the appointment with the physician on the same day therefore that is can have to be rescheduled. I really want to see if there is anything they feel like that could be done differently to try to help this out as I am not really certain that the infusions are helping significantly here. 11/13/2020 upon evaluation today patient unfortunately appears to be doing somewhat poorly in regard to his wound I feel like this is actually worsening from the standpoint of the pyoderma spreading. I still feel like that he may need something different as far as trying to manage this going forward. Again we did the prednisone unfortunately his blood sugars are not doing so well because of this. Nonetheless I believe that the patient likely needs to try topical steroid. We have done triamcinolone for a while I think going with something stronger such as clobetasol could be beneficial again this is not something I do lightly I discussed this with the patient that again this does not normally put underneath an occlusive dressing. Nonetheless I think a thin film as such could help with some of the stronger anti-inflammatory effects. We discussed this today. He would like to try to give this a trial for the next couple weeks. I definitely think that is something that we can do. Evaluate7/03/2021 and today patient's wound bed actually showed signs of doing really about the same. There was a little expansion of the size of the wound and that leading edge that we done  looking out although the clobetasol does seem to have slowed this down a bit in my opinion. There is just 1 small area that still seems to be progressing based on what I see. Nonetheless I am concerned about the fact this does not seem to be improving if anything seems to be doing a little bit worse. I do not know that the infusions are really helping him much as next infusion is August 5 his appointment with dermatology is July 25. Either way I really think  that we need to have a conversation potentially about this and I am actually going to see if I can talk with Dr. Lillia Carmel in order to see where things stand as well. 12/11/2020 upon evaluation today patient appears to be doing worse in regard to his leg ulcer. Unfortunately I just do not think this is making the progress that I would like to see at this point. Honestly he does have an appointment with dermatology and this is in 2 days. I am wondering what they may have to offer to help with this. Right now what I am seeing is that he is continuing to show signs of worsening little by little. Obviously that is not great at all. Is the exact opposite of what we are looking for. 12/18/2020 upon evaluation today patient appears to be doing a little better in regard to his wound. The dermatologist actually did do some steroid injections into the wound which does seem to have been beneficial in my opinion. That was on the 25th already this looks a little better to me than last time I saw him. With that being said we did do a culture and this did show that he has Staph aureus noted in abundance in the wound. With that being said I do think that getting him on an oral antibiotic would be appropriate as well. Also think we can compression wrap and this will make a difference as well. 12/28/2020 upon evaluation today patient's wound is actually showing signs of doing much better. I do believe the compression wrap is helping he has a lot of drainage but to be  honest I think that the compression is helping to some degree in this regard as well as not draining through which is also good news. No fevers, chills, nausea, vomiting, or diarrhea. 01/04/2021 upon evaluation today patient appears to be doing well with regard to his wound. Overall things seem to be doing quite well. He did have a little bit of reaction to the CarboFlex Sorbact he will be using that any longer. With that being said he is controlled as far as the drainage is concerned overall and seems to be doing quite well. I do not see any signs of active infection at this time which is great news. No fevers, chills, nausea, vomiting, or diarrhea. 01/11/2021 upon evaluation today patient appears to be doing well with regard to his wounds. He has been tolerating the dressing changes without complication. Fortunately there does not appear to be any signs of active infection at this time which is great news. Overall I am extremely pleased with where we stand currently. No fevers, chills, nausea, vomiting, or diarrhea. Where using clobetasol in the wound bed he has a lot of new skin growth which is awesome as well. 01/18/2021 upon evaluation today patient appears to be doing very well in regard to his leg ulcer. He has been tolerating the dressing changes without complication. Fortunately there does not appear to be any signs of active infection which is great news. In general I think that he is making excellent progress 01/25/2021 upon evaluation today patient appears to be doing well with regard to his wound on the leg. I am actually extremely pleased with where things stand today. There does not appear to be any signs of active infection which is great news and overall I think that we are definitely headed in the appropriate direction based on what I am seeing currently. There does not appear to be any signs  of active infection also excellent news. 02/06/2021 upon evaluation today patient appears to be  doing well with regard to his wound. Overall visually this is showing signs of significant [name], [name] ([id number]) improvement which is great news. I do not see any signs of active infection systemically which is great even locally I do not think that we are seeing any major complications here. We did do fluorescence imaging with the [employer] DX today. The patient does have some odor and drainage noted and again this is something that I think would benefit him to probably come more frequently for nurse visits. 02/19/2021 upon evaluation today patient actually appears to be doing quite well in regard to his wound. He has been tolerating the dressing changes without complication and overall I think that this is making excellent progress. I do not see any evidence of active infection at this point which is great news as well. No fevers, chills, nausea, vomiting, or diarrhea. 10/10; wound is made nice progress healthy granulation with a nice rim of epithelialization which seems to be expanding even from last week he has a deeper area in the inferior part of the more distal part of the wound with not quite as healthy as surface. This area will need to be followed. Using clobetasol and Hydrofera Blue 03/05/2021 upon evaluation today patient appears to be doing very well in regard to his leg ulcer. He has been tolerating dressing changes without complication. Fortunately there does not appear to be any signs of active infection which is great news and overall I am extremely pleased with where we stand currently. 03/12/2021 upon evaluation today patient appears to be doing well with regard to his wound in fact this is extremely extremely good based on what we are seeing today there does not appear to be any signs of active infection and overall I think that he is doing awesome from the standpoint of healing in general. I am extremely pleased with how things seem to be progressing with regard to this  pyoderma. Clobetasol has done wonders for him. I think the compression wrapping has also been of great benefit. 03/19/2021 upon evaluation today patient appears to be doing well with regard to his wound. He is tolerating the dressing changes without complication. In fact I feel like that he is actually making excellent progress at this point based on what I am seeing. No fevers, chills, nausea, vomiting, or diarrhea. 03/26/2021 upon evaluation today patient appears to be doing well with regard to his wound. This again is measuring smaller and looking better. Again the progress is slow but nonetheless continual with what we have been seeing. I do believe that the current plan is doing awesome for him. 04/09/2021 upon evaluation today patient appears to be doing well with regard to his leg ulcer. This is showing signs of excellent improvement the muscle is completely closed over and there does not appear to be any evidence of inflammation at this point his drainage is significantly improved. Overall I think that he would be a good candidate for looking into a skin substitute at this point as well. We will get a look into some approvals in that regard. Potentially TheraSkin as well as Apligraf could both be considered just depending on insurance coverage. 04/16/2021 upon evaluation today patient appears to be doing well at this point. He has been approved for the Apligraf which we could definitely order although I would like to try to get the TheraSkin approved if at all possible.  I did fax notes into them today and I Georgina Peer try to give a call as well. Overall the wound appears to be doing decently well today. 04/20/2021 upon evaluation today patient actually appears to be doing quite well in regard to his wound with that being said we are trying to see what we can do about speeding up the healing process. For that reason we did discuss the possibility of a skin substitute. We got the Apligraf approved.  For that reason we will get a go ahead and see what we can do with the Apligraf at this point. I am still trying to get the TheraSkin approved but I have not heard anything from the insurance company yet I have called and talked to them earlier in the week and to be honest this was about half an hour that I spent on the phone they told me I would hear something within 10-15 business days. 04/27/2021 upon evaluation today patient appears to be doing excellent in regard to his wound. I do believe the Apligraf has been beneficial. With that being said he has had a little bit of increased pain has been a little bit concerned. I do want to go ahead and apply his steroid cream today the clobetasol and then put the Apligraf over I just do not think I want to risk not putting the clobetasol on based on what is been going on here currently. Patient voiced understanding. 05/04/2021 upon evaluation today patient is making excellent improvement. Overall there is definitely decrease in the size of the wound today and I am very pleased in that regard. I do not see any signs of active infection locally nor systemically at this point. No fevers, chills, nausea, vomiting, or diarrhea. 05/11/2021 upon evaluation today patient appears to be doing well with regard to his wound. He has been tolerating Apligraf's without complication and is making excellent progress this is application #4 today. 12/30; patient here for Apligraf application. Appears to be doing well small wound there has been a lot of healing 05/28/2021 upon evaluation today patient appears to be doing well with regard to his wound. Has been tolerating the dressing changes without complication. Fortunately I do not see any signs of active infection locally nor systemically at this point which is great news. He is done with the Apligraf and the first round and overall this has filled in quite nicely I am not even certain he needs any additional at the moment. I  would actually try to go back to clobetasol with [facility] which previously was doing well for him. 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 He is using clobetasol and Hydrofera Blue. Surface area of the wound is smaller and the surface looks very healthy. 06/11/2021 upon evaluation today patient actually appears to be showing signs of good improvement which is great news. Fortunately I do not see any signs of active infection at this time which is also excellent news. I do believe that the patient is doing well with the clobetasol and Hydrofera Blue. He does have some irritation and itching around the rest of the leg will use some triamcinolone over this area. Objective Benney, Longino ([id number]) Constitutional Obese and well-hydrated in no acute distress. Vitals Time Taken: 8:20 AM, Height: 71 in, Weight: 338 lbs, BMI: 47.1, Temperature: 98 F, Pulse: 86 bpm, Respiratory Rate: 18 breaths/min, Blood Pressure: 128/86 mmHg. Respiratory normal breathing without difficulty. Psychiatric this patient is able to make  decisions and demonstrates good insight into disease process. Alert and Oriented x 3. pleasant and cooperative. General Notes: Upon inspection patient's wound bed actually showed signs of good granulation epithelization at this point. Fortunately there does not appear to be any evidence of active infection locally nor systemically at this time which is great news and overall I am extremely pleased with where we stand today. I do believe he is on the right track to getting this completely healed hopefully it will not be too much longer. Integumentary (Hair, Skin) Wound #1 status is Open. Original cause of wound was Gradually Appeared. The date acquired was: 11/18/2015. The wound has been in treatment 235 weeks. The wound is located on the Left,Lateral Lower Leg. The wound measures 4.5cm length x 3cm width x 0.1cm depth; 10.603cm^2 area  and 1.06cm^3 volume. There is Fat Layer (Subcutaneous Tissue) exposed. There is no tunneling or undermining noted. There is a medium amount of serosanguineous drainage noted. The wound margin is flat and intact. There is large (67-100%) red, pink granulation within the wound bed. There is a small (1-33%) amount of necrotic tissue within the wound bed including Adherent Slough. General Notes: red spots noted throughout leg pt states ones at top are from him itching leg. 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 There was a Four Layer Compression Therapy Procedure by Levora Dredge, RN. Post procedure Diagnosis Wound #1: Same as Pre-Procedure Plan Follow-up Appointments: Return Appointment in 1 week. Nurse Visit as needed - twice a week Bathing/ Shower/ Hygiene: Clean wound with Normal Saline or wound cleanser. May shower with wound dressing protected with water repellent cover or cast protector. Edema Control - Lymphedema / Segmental Compressive Device / Other: Optional: One layer of unna paste to top of compression wrap (to act as an anchor). Elevate, Exercise Daily and Avoid Standing for Long Periods of Time. Elevate legs to the level of the heart and pump ankles as often as possible Elevate leg(s) parallel to the floor when sitting. WOUND #1: - Lower Leg Wound Laterality: Left, Lateral Cleanser: Wound Cleanser 3 x Per Week/30 Days Discharge Instructions: Wash your hands with soap and water. Remove old dressing, discard into plastic bag and place into trash. Cleanse the wound with Wound Cleanser  prior to applying a clean dressing using gauze sponges, not tissues or cotton balls. Do not scrub or use Pultz, Almir ([id number]) excessive force. Pat dry using gauze sponges, not tissue or cotton balls. Peri-Wound Care: Moisturizing Lotion 3 x Per Week/30 Days Discharge Instructions: Suggestions: Theraderm, Eucerin, Cetaphil, or patient preference. Topical: Clobetasol Propionate ointment 0.05%, 60 (g) tube 3 x Per Week/30 Days Discharge Instructions: Pt to bring to appt- Topical: Triamcinolone Acetonide Cream, 0.1%, 15 (g) tube 3 x Per Week/30 Days Discharge Instructions: Apply as directed by provider. To leg not on wound Primary Dressing: Hydrofera Blue Ready Transfer Foam, 4x5 (in/in) 3 x Per Week/30 Days Discharge Instructions: Apply Hydrofera Blue Ready to wound bed as directed Secondary Dressing: Zetuvit Plus Silicone Non-bordered 5x5 (in/in) 3 x Per Week/30 Days Compression Wrap: Medichoice 4 layer Compression System, 35-40 mmHG (Generic) 3 x Per Week/30 Days Discharge Instructions: Apply multi-layer wrap as directed. 1. Would  recommend currently that we going continue with the wound care measures as before and the patient is in agreement with the plan. This includes the use of the 4-layer compression wrap which I think is doing a great job. 2. We will continue with the clobetasol along with the Hydrofera Blue over the wound itself. 3. Regular use some triamcinolone to the leg not over the wound in order to help with some of the itching and irritation from the wrap. We will see patient back for reevaluation in 1 week here in the clinic. If anything worsens or changes patient will contact our office for additional recommendations. Electronic Signature(s) Signed: 06/11/2021 8:53:37 AM By: Worthy Keeler PA-C Entered By: Worthy Keeler on 06/11/2021 08:53:36 Maclaren, Maxwel ([id number]) -------------------------------------------------------------------------------- SuperBill  Details Patient Name: [name] Date of Service: 06/11/2021 Medical Record Number: [id number] Patient Account Number: [id number] Date of Birth/Sex: 1981-01-22 (40 y.o. M) Treating RN: Levora Dredge Primary Care Provider: [name] Other Clinician: Referring Provider: [name] Treating Provider/Extender: [name] Cliche in Treatment: 235 Diagnosis Coding ICD-10 Codes Code Description I87.2 Venous insufficiency (chronic) (peripheral) L97.222 Non-pressure chronic ulcer of left calf with fat layer exposed E11.622 Type 2 diabetes mellitus with other skin ulcer L88 Pyoderma gangrenosum F17.208 Nicotine dependence, unspecified, with other nicotine-induced disorders Facility Procedures CPT4 Code: 95284132 Description: (Facility Use Only) [id number] - [facility] LWR LT LEG Modifier: Quantity: 1 Physician Procedures CPT4 Code: 2536644 Description: 03474 - WC PHYS LEVEL 4 - EST PT Modifier: Quantity: 1 CPT4 Code: Description: ICD-10 Diagnosis Description I87.2 Venous insufficiency (chronic) (peripheral) L97.222 Non-pressure chronic ulcer of left calf with fat layer exposed E11.622 Type 2 diabetes mellitus with other skin ulcer L88 Pyoderma gangrenosum Modifier: Quantity: Electronic Signature(s) Signed: 06/11/2021 9:03:36 AM By: Worthy Keeler PA-C Entered By: Worthy Keeler on 06/11/2021 09:03:35

## 2021-06-12 ENCOUNTER — Telehealth: Payer: Self-pay

## 2021-06-13 ENCOUNTER — Other Ambulatory Visit: Payer: Self-pay

## 2021-06-13 DIAGNOSIS — I872 Venous insufficiency (chronic) (peripheral): Secondary | ICD-10-CM | POA: Diagnosis not present

## 2021-06-13 NOTE — Progress Notes (Signed)
[name], [name] ([id number]) Visit Report for 06/13/2021 Arrival Information Details Patient Name: [name], [name] Date of Service: 06/13/2021 8:00 AM Medical Record Number: [id number] Patient Account Number: [id number] Date of Birth/Sex: 1980-09-23 (41 y.o. M) Treating RN: Donnamarie Poag Primary Care Dandrea Widdowson: [name] Other Clinician: Referring Baani Bober: [name] Treating Briel Gallicchio/Extender: Yaakov Guthrie in Treatment: 5 Visit Information History Since Last Visit Added or deleted any medications: No Patient Arrived: Ambulatory Had a fall or experienced change in No Arrival Time: 08:01 activities of daily living that may affect Accompanied By: self risk of falls: Transfer Assistance: None Hospitalized since last visit: No Patient Identification Verified: Yes Has Dressing in Place as Prescribed: Yes Secondary Verification Process Completed: Yes Has Compression in Place as Prescribed: Yes Patient Requires Transmission-Based No Pain Present Now: No Precautions: Patient Has Alerts: Yes Patient Alerts: Patient has reaction to silver dressings. Electronic Signature(s) Signed: 06/13/2021 1:37:32 PM By: Donnamarie Poag Entered By: Donnamarie Poag on 06/13/2021 08:20:37 [name] ([id number]) -------------------------------------------------------------------------------- Compression Therapy Details Patient Name: [name] Date of Service: 06/13/2021 8:00 AM Medical Record Number: [id number] Patient Account Number: [id number] Date of Birth/Sex: 12-Jan-1981 (40 y.o. M) Treating RN: Donnamarie Poag Primary Care Sumner Kirchman: [name] Other Clinician: Referring Toy Samarin: [name] Treating Kaipo Ardis/Extender: Yaakov Guthrie in Treatment: 236 Compression Therapy Performed for Wound Assessment: Wound #1 Left,Lateral Lower Leg Performed By: Junius Argyle, RN Compression Type: Four Layer Electronic Signature(s) Signed: 06/13/2021 1:37:32 PM By: Donnamarie Poag Entered By: Donnamarie Poag on 06/13/2021 08:22:20 [name] ([id number]) -------------------------------------------------------------------------------- Encounter Discharge Information Details Patient Name: [name] Date of Service: 06/13/2021 8:00 AM Medical Record Number: [id number] Patient Account Number: [id number] Date of Birth/Sex: 07-14-1980 (40 y.o. M) Treating RN: Donnamarie Poag Primary Care Jason Hauge: [name] Other Clinician: Referring Dayvin Aber: [name] Treating Levonne Carreras/Extender: Yaakov Guthrie in Treatment: 236 Encounter Discharge Information Items Discharge Condition: Stable Ambulatory Status: Ambulatory Discharge Destination: Home Transportation: Private Auto Accompanied By: self Schedule Follow-up Appointment: Yes Clinical Summary of Care: Electronic Signature(s) Signed: 06/13/2021 1:37:32 PM By: Donnamarie Poag Entered By: Donnamarie Poag on 06/13/2021 08:23:27 [name] ([id number]) -------------------------------------------------------------------------------- Wound Assessment Details Patient Name: [name] Date of Service: 06/13/2021 8:00 AM Medical Record Number: [id number] Patient Account Number: [id number] Date of Birth/Sex: 04-08-1981 (40 y.o. M) Treating RN: Donnamarie Poag Primary Care Minahil Quinlivan: [name] Other Clinician: Referring Laster Appling: [name] Treating Enid Maultsby/Extender: Yaakov Guthrie in Treatment: 236 Wound Status Wound Number: 1 Primary Etiology: Pyoderma Wound Location: Left, Lateral Lower Leg Wound Status: Open Wounding Event: Gradually Appeared Comorbid History: Sleep Apnea, Hypertension, Colitis Date Acquired: 11/18/2015 Weeks Of Treatment: 236 Clustered Wound: No Wound Measurements Length: (cm) 4.5 Width: (cm) 3 Depth: (cm) 0.1 Area: (cm) 10.603 Volume: (cm) 1.06 % Reduction in Area: -116% % Reduction in Volume: 73% Epithelialization: Medium (34-66%) Wound  Description Classification: Full Thickness With Exposed Support Structures Wound Margin: Flat and Intact Exudate Amount: Medium Exudate Type: Serosanguineous Exudate Color: red, brown Foul Odor After Cleansing: No Slough/Fibrino Yes Wound Bed Granulation Amount: Large (67-100%) Exposed Structure Granulation Quality: Red, Pink Fascia Exposed: No Necrotic Amount: Small (1-33%) Fat Layer (Subcutaneous Tissue) Exposed: Yes Necrotic Quality: Adherent Slough Tendon Exposed: No Muscle Exposed: No Joint Exposed: No Bone Exposed: No Treatment Notes Wound #1 (Lower Leg) Wound Laterality: Left, Lateral Cleanser Wound Cleanser Discharge Instruction: Wash your hands with soap and water. Remove old dressing, discard into plastic bag and place into trash. Cleanse the wound with Wound Cleanser prior to applying a clean dressing using gauze sponges,  not tissues or cotton balls. Do not scrub or use excessive force. Pat dry using gauze sponges, not tissue or cotton balls. Peri-Wound Care Moisturizing Lotion Discharge Instruction: Suggestions: Theraderm, Eucerin, Cetaphil, or patient preference. Topical Clobetasol Propionate ointment 0.05%, 60 (g) tube Discharge Instruction: Pt to bring to appt- Triamcinolone Acetonide Cream, 0.1%, 15 (g) tube Discharge Instruction: Apply as directed by Avyay Coger. To leg not on wound Primary Dressing Hydrofera Blue Ready Transfer Foam, 4x5 (in/in) Discharge Instruction: Apply Hydrofera Blue Ready to wound bed as directed [name], [name] ([id number]) Secondary Dressing Zetuvit Plus Silicone Non-bordered 5x5 (in/in) Secured With Compression Wrap Medichoice 4 layer Compression System, 35-40 mmHG Discharge Instruction: Apply multi-layer wrap as directed. Compression Stockings Add-Ons Electronic Signature(s) Signed: 06/13/2021 1:37:32 PM By: Donnamarie Poag Entered ByDonnamarie Poag on 06/13/2021 08:22:05

## 2021-06-13 NOTE — Progress Notes (Signed)
JAMIEON, DOLMAN ([id number]) Visit Report for 06/13/2021 Physician Orders Details Patient Name: [name], [name] Date of Service: 06/13/2021 8:00 AM Medical Record Number: [id number] Patient Account Number: [id number] Date of Birth/Sex: [birth year] (41 y.o. M) Treating RN: Donnamarie Poag Primary Care Provider: [name] Other Clinician: Referring Provider: [name] Treating Provider/Extender: Yaakov Guthrie in Treatment: 32 Verbal / Phone Orders: No Diagnosis Coding Follow-up Appointments o Return Appointment in 1 week. o Nurse Visit as needed - twice a week Bathing/ Shower/ Hygiene o Clean wound with Normal Saline or wound cleanser. o May shower with wound dressing protected with water repellent cover or cast protector. Edema Control - Lymphedema / Segmental Compressive Device / Other o Optional: One layer of unna paste to top of compression wrap (to act as an anchor). o Elevate, Exercise Daily and Avoid Standing for Long Periods of Time. o Elevate legs to the level of the heart and pump ankles as often as possible o Elevate leg(s) parallel to the floor when sitting. Wound Treatment Wound #1 - Lower Leg Wound Laterality: Left, Lateral Cleanser: Wound Cleanser 3 x Per Week/30 Days Discharge Instructions: Wash your hands with soap and water. Remove old dressing, discard into plastic bag and place into trash. Cleanse the wound with Wound Cleanser prior to applying a clean dressing using gauze sponges, not tissues or cotton balls. Do not scrub or use excessive force. Pat dry using gauze sponges, not tissue or cotton balls. Peri-Wound Care: Moisturizing Lotion 3 x Per Week/30 Days Discharge Instructions: Suggestions: Theraderm, Eucerin, Cetaphil, or patient preference. Topical: Clobetasol Propionate ointment 0.05%, 60 (g) tube 3 x Per Week/30 Days Discharge Instructions: Pt to bring to appt- Topical: Triamcinolone Acetonide Cream, 0.1%, 15 (g) tube 3 x Per  Week/30 Days Discharge Instructions: Apply as directed by provider. To leg not on wound Primary Dressing: Hydrofera Blue Ready Transfer Foam, 4x5 (in/in) 3 x Per Week/30 Days Discharge Instructions: Apply Hydrofera Blue Ready to wound bed as directed Secondary Dressing: Zetuvit Plus Silicone Non-bordered 5x5 (in/in) 3 x Per Week/30 Days Compression Wrap: Medichoice 4 layer Compression System, 35-40 mmHG (Generic) 3 x Per Week/30 Days Discharge Instructions: Apply multi-layer wrap as directed. Electronic Signature(s) Signed: 06/13/2021 9:07:49 AM By: Kalman Shan DO Signed: 06/13/2021 1:37:32 PM By: Donnamarie Poag Entered By: Donnamarie Poag on 06/13/2021 08:23:00 [name] ([id number]) -------------------------------------------------------------------------------- [place] Details Patient Name: [name] Date of Service: 06/13/2021 Medical Record Number: [id number] Patient Account Number: [id number] Date of Birth/Sex: 09/14/1980 (40 y.o. 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

## 2021-06-15 ENCOUNTER — Other Ambulatory Visit: Payer: Self-pay

## 2021-06-15 DIAGNOSIS — I872 Venous insufficiency (chronic) (peripheral): Secondary | ICD-10-CM | POA: Diagnosis not present

## 2021-06-15 NOTE — Progress Notes (Signed)
ALFONSO, CARDEN ([id number]) Visit Report for 06/15/2021 Physician Orders Details Patient Name: [name], [name] Date of Service: 06/15/2021 8:00 AM Medical Record Number: [id number] Patient Account Number: [id number] Date of Birth/Sex: 08-27-1980 (41 y.o. M) Treating RN: Donnamarie Poag Primary Care Provider: [name] Other Clinician: Referring Provider: [name] Treating Provider/Extender: [name] Cliche in Treatment: [phone] Verbal / Phone Orders: No Diagnosis Coding Follow-up Appointments o Return Appointment in 1 week. o Nurse Visit as needed - twice a week Bathing/ Shower/ Hygiene o Clean wound with Normal Saline or wound cleanser. o May shower with wound dressing protected with water repellent cover or cast protector. Edema Control - Lymphedema / Segmental Compressive Device / Other o Optional: One layer of unna paste to top of compression wrap (to act as an anchor). o Elevate, Exercise Daily and Avoid Standing for Long Periods of Time. o Elevate legs to the level of the heart and pump ankles as often as possible o Elevate leg(s) parallel to the floor when sitting. Wound Treatment Wound #1 - Lower Leg Wound Laterality: Left, Lateral Cleanser: Wound Cleanser 3 x Per Week/30 Days Discharge Instructions: Wash your hands with soap and water. Remove old dressing, discard into plastic bag and place into trash. Cleanse the wound with Wound Cleanser prior to applying a clean dressing using gauze sponges, not tissues or cotton balls. Do not scrub or use excessive force. Pat dry using gauze sponges, not tissue or cotton balls. Peri-Wound Care: Moisturizing Lotion 3 x Per Week/30 Days Discharge Instructions: Suggestions: Theraderm, Eucerin, Cetaphil, or patient preference. Topical: Clobetasol Propionate ointment 0.05%, 60 (g) tube 3 x Per Week/30 Days Discharge Instructions: Pt to bring to appt- Topical: Triamcinolone Acetonide Cream, 0.1%, 15 (g) tube 3 x Per Week/30  Days Discharge Instructions: Apply as directed by provider. To leg not on wound Primary Dressing: Hydrofera Blue Ready Transfer Foam, 4x5 (in/in) 3 x Per Week/30 Days Discharge Instructions: Apply Hydrofera Blue Ready to wound bed as directed Secondary Dressing: Zetuvit Plus Silicone Non-bordered 5x5 (in/in) 3 x Per Week/30 Days Compression Wrap: Medichoice 4 layer Compression System, 35-40 mmHG (Generic) 3 x Per Week/30 Days Discharge Instructions: Apply multi-layer wrap as directed. Electronic Signature(s) Signed: 06/15/2021 4:05:14 PM By: Donnamarie Poag Signed: 06/15/2021 4:30:05 PM By: Worthy Keeler PA-C Entered By: Donnamarie Poag on 06/15/2021 08:27:41 [name], [name] ([id number]) -------------------------------------------------------------------------------- [place] Details Patient Name: [name] Date of Service: 06/15/2021 Medical Record Number: [id number] Patient Account Number: [id number] Date of Birth/Sex: Feb 28, 1981 (41 y.o. 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

## 2021-06-15 NOTE — Progress Notes (Signed)
LERAY, GARVERICK ([id number]) Visit Report for 06/15/2021 Arrival Information Details Patient Name: [name], [name] Date of Service: 06/15/2021 8:00 AM Medical Record Number: [id number] Patient Account Number: [id number] Date of Birth/Sex: Dec 09, 1980 (41 y.o. M) Treating RN: Donnamarie Poag Primary Care Wess Baney: [name] Other Clinician: Referring Felder Lebeda: [name] Treating Lita Flynn/Extender: [name] Cliche in Treatment: 85 Visit Information History Since Last Visit Added or deleted any medications: No Patient Arrived: Ambulatory Had a fall or experienced change in No Arrival Time: 08:02 activities of daily living that may affect Accompanied By: self risk of falls: Transfer Assistance: None Hospitalized since last visit: No Patient Identification Verified: Yes Has Dressing in Place as Prescribed: Yes Secondary Verification Process Completed: Yes Has Compression in Place as Prescribed: Yes Patient Requires Transmission-Based No Pain Present Now: No Precautions: Patient Has Alerts: Yes Patient Alerts: Patient has reaction to silver dressings. Electronic Signature(s) Signed: 06/15/2021 4:05:14 PM By: Donnamarie Poag Entered By: Donnamarie Poag on 06/15/2021 08:22:44 [name] ([id number]) -------------------------------------------------------------------------------- Compression Therapy Details Patient Name: [name] Date of Service: 06/15/2021 8:00 AM Medical Record Number: [id number] Patient Account Number: [id number] Date of Birth/Sex: 1981-03-25 (40 y.o. M) Treating RN: Donnamarie Poag Primary Care Roshanna Cimino: [name] Other Clinician: Referring Verdell Dykman: [name] Treating Yarenis Cerino/Extender: [name] Cliche in Treatment: 236 Compression Therapy Performed for Wound Assessment: Wound #1 Left,Lateral Lower Leg Performed By: Junius Argyle, RN Compression Type: Four Layer Electronic Signature(s) Signed: 06/15/2021 4:05:14 PM By: Donnamarie Poag Entered By: Donnamarie Poag on 06/15/2021 08:27:01 [name] ([id number]) -------------------------------------------------------------------------------- Encounter Discharge Information Details Patient Name: [name] Date of Service: 06/15/2021 8:00 AM Medical Record Number: [id number] Patient Account Number: [id number] Date of Birth/Sex: 12-18-1980 (40 y.o. M) Treating RN: Donnamarie Poag Primary Care Melissaann Dizdarevic: [name] Other Clinician: Referring Cherica Heiden: [name] Treating Benigna Delisi/Extender: [name] Cliche in Treatment: 236 Encounter Discharge Information Items Discharge Condition: Stable Ambulatory Status: Ambulatory Discharge Destination: Home Transportation: Private Auto Accompanied By: self Schedule Follow-up Appointment: Yes Clinical Summary of Care: Electronic Signature(s) Signed: 06/15/2021 4:05:14 PM By: Donnamarie Poag Entered By: Donnamarie Poag on 06/15/2021 08:28:16 Mauro, [name] ([id number]) -------------------------------------------------------------------------------- Wound Assessment Details Patient Name: [name] Date of Service: 06/15/2021 8:00 AM Medical Record Number: [id number] Patient Account Number: [id number] Date of Birth/Sex: [birth year] (40 y.o. M) Treating RN: Donnamarie Poag Primary Care Tip Atienza: [name] Other Clinician: Referring Izaias Krupka: [name] Treating Duard Spiewak/Extender: [name] Cliche in Treatment: 236 Wound Status Wound Number: 1 Primary Etiology: Pyoderma Wound Location: Left, Lateral Lower Leg Wound Status: Open Wounding Event: Gradually Appeared Comorbid History: Sleep Apnea, Hypertension, Colitis Date Acquired: 11/18/2015 Weeks Of Treatment: 236 Clustered Wound: No Wound Measurements Length: (cm) 4.5 Width: (cm) 3 Depth: (cm) 0.1 Area: (cm) 10.603 Volume: (cm) 1.06 % Reduction in Area: -116% % Reduction in Volume: 73% Epithelialization: Medium (34-66%) Wound Description Classification: Full  Thickness With Exposed Support Structures Wound Margin: Flat and Intact Exudate Amount: Medium Exudate Type: Serosanguineous Exudate Color: red, brown Foul Odor After Cleansing: No Slough/Fibrino Yes Wound Bed Granulation Amount: Large (67-100%) Exposed Structure Granulation Quality: Red, Pink Fascia Exposed: No Necrotic Amount: Small (1-33%) Fat Layer (Subcutaneous Tissue) Exposed: Yes Necrotic Quality: Adherent Slough Tendon Exposed: No Muscle Exposed: No Joint Exposed: No Bone Exposed: No Treatment Notes Wound #1 (Lower Leg) Wound Laterality: Left, Lateral Cleanser Wound Cleanser Discharge Instruction: Wash your hands with soap and water. Remove old dressing, discard into plastic bag and place into trash. Cleanse the wound with Wound Cleanser prior to applying a clean dressing using gauze sponges,  not tissues or cotton balls. Do not scrub or use excessive force. Pat dry using gauze sponges, not tissue or cotton balls. Peri-Wound Care Moisturizing Lotion Discharge Instruction: Suggestions: Theraderm, Eucerin, Cetaphil, or patient preference. Topical Clobetasol Propionate ointment 0.05%, 60 (g) tube Discharge Instruction: Pt to bring to appt- Triamcinolone Acetonide Cream, 0.1%, 15 (g) tube Discharge Instruction: Apply as directed by Della Scrivener. To leg not on wound Primary Dressing Hydrofera Blue Ready Transfer Foam, 4x5 (in/in) Discharge Instruction: Apply Hydrofera Blue Ready to wound bed as directed TRYSON, LUMLEY ([id number]) Secondary Dressing Zetuvit Plus Silicone Non-bordered 5x5 (in/in) Secured With Compression Wrap Medichoice 4 layer Compression System, 35-40 mmHG Discharge Instruction: Apply multi-layer wrap as directed. Compression Stockings Add-Ons Electronic Signature(s) Signed: 06/15/2021 4:05:14 PM By: Donnamarie Poag Entered ByDonnamarie Poag on 06/15/2021 08:25:52

## 2021-06-18 ENCOUNTER — Other Ambulatory Visit: Payer: Self-pay

## 2021-06-18 ENCOUNTER — Encounter: Payer: 59 | Admitting: Physician Assistant

## 2021-06-18 DIAGNOSIS — I872 Venous insufficiency (chronic) (peripheral): Secondary | ICD-10-CM | POA: Diagnosis not present

## 2021-06-18 NOTE — Progress Notes (Addendum)
ELISA, SORLIE ([id number]) Visit Report for 06/18/2021 Chief Complaint Document Details Patient Name: [name], [name] Date of Service: 06/18/2021 8:15 AM Medical Record Number: [id number] Patient Account Number: [id number] Date of Birth/Sex: May 29, 1980 (41 y.o. 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 M) Treating RN: [name] Primary Care Provider: [name] Other Clinician: Referring Provider: [name] Treating Provider/Extender: [name] Cliche in Treatment: 53 History of Present Illness HPI Description: 12/04/16; 41 year old man who comes into the clinic today for review of a wound on the posterior left calf. He tells me that is been there for about a year. He is not a diabetic he does smoke half a pack per day. He was seen in the ER on 11/20/16 felt to have cellulitis around the wound and was given clindamycin. An x-ray did not show osteomyelitis. The patient initially tells me that he has a milk allergy that sets off a pruritic itching rash on his lower legs which she scratches incessantly and he thinks that's what may have set up the wound. He has been using various topical antibiotics and ointments without any effect. He works in a [employer] and is on his feet all day. He does not have a prior history of  wounds however he does have the rash on both lower legs the right arm and the ventral aspect of his left arm. These are excoriations and clearly have had scratching however there are of macular looking areas on both legs including a substantial larger area on the right leg. This does not have an underlying open area. There is no blistering. The patient tells me that 2 years ago in [place] in response to the rash on his legs he saw a dermatologist who told him he had a condition which may be pyoderma gangrenosum although I may be putting words into his mouth. He seemed to recognize this. On further questioning he admits to a 5 year history of quiesced. ulcerative colitis. He is not in any treatment for this. He's had no recent travel 12/11/16; the patient arrives today with his wound and roughly the same condition we've been using silver alginate this is a deep punched out wound with some surrounding erythema but no tenderness. Biopsy I did did not show confirmed pyoderma gangrenosum suggested nonspecific inflammation and vasculitis but does not provide an actual description of what was seen by the pathologist. I'm really not able to understand this We have also received information from the patient's dermatologist in [place] notes from April 2016. This was a doctor Agarwal-antal. 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 He was prescribed topical steroid high potency under occlusion which helped but at this point the patient did not have a deep punched out wound. 12/18/16; the patient's wound is larger in terms of surface area however this surface looks better and there is less depth. The surrounding erythema also is better. The patient states that the wrap we put on came off 2 days ago when he has been using his compression stockings.  He we are in the process of getting a dermatology consult. 12/26/16 on evaluation today patient's left lower extremity wound shows evidence of infection with  surrounding erythema noted. He has been tolerating the dressing changes but states that he has noted more discomfort. There is a larger area of erythema surrounding the wound. No fevers, chills, nausea, or vomiting noted at this time. With that being said the wound still does have slough covering the surface. He is not allergic to any medication that he is aware of at this point. In regard to his right lower extremity he had several regions that are erythematous and pruritic he wonders if there's anything we can do to help that. 01/02/17 I reviewed patient's wound culture which was obtained his visit last week. He was placed on doxycycline at that point. Unfortunately that does not appear to be an antibiotic that would likely help with the situation however the pseudomonas noted on culture is sensitive to Cipro. Also unfortunately patient's wound seems to have a large compared to last week's evaluation. Not severely so but there are definitely increased measurements in general. He is continuing to have discomfort as well he writes this to be a seven out of 10. In fact he would prefer me not to perform any debridement today due to the fact that he is having discomfort and considering he has an active infection on the little reluctant to do so anyway. No fevers, chills, nausea, or vomiting noted at this time. 01/08/17; patient seems dermatology on September 5. I suspect dermatology will want the slides from the biopsy I did sent to their pathologist. I'm not sure if there is a way we can expedite that. In any case the culture I did before I left on vacation 3 weeks ago showed Pseudomonas he was given 10 days of Cipro and per her description of her intake nurses is actually somewhat better this week although the wound is quite a bit bigger than I remember the last time I saw this. He still has 3 more days of Cipro 01/21/17; dermatology appointment tomorrow. He has completed the ciprofloxacin for Pseudomonas.  Surface of the wound looks better however he is had some deterioration in the lesions on his right leg. Meantime the left lateral leg wound we will continue with sample 01/29/17; patient had his dermatology appointment but I can't yet see that note. He is completed his antibiotics. The wound is more superficial but considerably larger in circumferential area than when he came in. This is in his left lateral calf. He also has swollen erythematous areas with superficial wounds on the right leg and small papular areas on both arms. There apparently areas in her his upper thighs and buttocks I did not look at those. Dermatology biopsied the right leg. Hopefully will have their input next week. 02/05/17; patient went back to see his dermatologist who told him that he had a "scratching problem" as well as staph. He is now on a 30 day course of doxycycline and I believe she gave him triamcinolone cream to the right leg areas to help with the itching [not exactly sure but probably triamcinolone]. She apparently looked at the left lateral leg wound although this was not rebiopsied and I think felt to be ultimately part of the same pathogenesis. He is using sample border foam and changing nevus himself. He now has a new open area on the right posterior leg which was his biopsy site I don't have any of the dermatology notes  02/12/17; we put the patient in compression last week with SANTYL to the wound on the left leg and the biopsy. Edema is much better and the depth of the wound is now at level of skin. Area is still the same oBiopsy site on the right lateral leg we've also been using santyl with a border foam dressing and he is changing this himself. 02/19/17; Using silver alginate started last week to both the substantial left leg wound and the biopsy site on the right wound. He is tolerating compression well. Has a an appointment with his primary M.D. tomorrow wondering about diuretics although I'm wondering if  the edema problem is actually lymphedema 02/26/17; the patient has been to see his primary doctor Dr. Jerrel Ivory at [facility] our primary care. She started him on Lasix 20 mg and this seems to have helped with the edema. However we are not making substantial change with the left lateral calf wound and inflammation. The biopsy site on the right leg also looks stable but not really all that different. 03/12/17; the patient has been to see vein and vascular Dr. Lucky Cowboy. He has had venous reflux studies I have not reviewed these. I did get a call from his dermatology office. They felt that he might have pathergy based on their biopsy on his right leg which led them to look at the slides of ISAC, LINCKS ([id number]) the biopsy I did on the left leg and they wonder whether this represents pyoderma gangrenosum which was the original supposition in a man with ulcerative colitis albeit inactive for many years. They therefore recommended clobetasol and tetracycline i.e. aggressive treatment for possible pyoderma gangrenosum. 03/26/17; apparently the patient just had reflux studies not an appointment with Dr. dew. She arrives in clinic today having applied clobetasol for 2-3 weeks. He notes over the last 2-3 days excessive drainage having to change the dressing 3-4 times a day and also expanding erythema. 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 Lillia Carmel at [facility] dermatology locally who kindly saw him at my request. 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 My concern was the degree of erythema medially extending from his left leg wound which was either inflammation from pyoderma or cellulitis. I  put him on Augmentin however culture of the wound showed Pseudomonas which is quinolone sensitive. I really don't believe he has cellulitis however in view of everything I will continue and give him a course of Cipro. He is also on doxycycline as an immune modulator for the pyoderma. In addition to his original wound on the left lateral leg with surrounding erythema he has a wound on the right posterior calf which was an original biopsy site done by dermatology. This was felt to represent pathergy from pyoderma gangrenosum 04/16/17; pyoderma gangrenosum. Saw Dr. Lillia Carmel yesterday. He has been using topical antibiotics to both wound areas his original wound on the left and the biopsies/pathergy area on the right. There is definitely some improvement in the inflammation around the wound on the right although the patient states he has increasing sensitivity of the wounds. He is on prednisone 60 and doxycycline 1 as prescribed by Dr. Lillia Carmel. He is covering the topical antibiotic with gauze  and putting this in his own compression stocks and changing this daily. He states that Dr. Lottie Rater did a culture of the left leg wound yesterday 05/07/17; pyoderma gangrenosum. The patient saw Dr. Lillia Carmel yesterday and has a follow-up with her in one month. He is still using topical antibiotics to both wounds although he can't recall exactly what type. He is still on prednisone 60 mg. Dr. Lillia Carmel stated that the doxycycline could stop if we were in agreement. 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 The right medial leg was induced by biopsy/pathergy. The area on the right is essentially healed. 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 The right medial leg has closed and remains closed. He is still on  prednisone 60. oHe tells me he missed his last dermatology appointment with Dr. Lillia Carmel but will make another appointment. He reports that her blood sugar at a recent screen in [place] was high 200's. He was 180 today. 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 I think he is going to require still much longer prednisone perhaps another 3 months before attempting to taper. In the meantime his wound is a lot better. Smaller. He is cleaning this off daily and applying topical antibiotics. When he was last in the clinic I thought about changing to [facility] and actually put in a couple of calls to dermatology although probably not during their business hours. 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 He continues on oral prednisone. He has been using triple antibiotic ointment. There is surface debris and we will transition to [facility] and have him return in 2 weeks. He has lost 30 pounds since his last appointment with lifestyle modification. He may benefit from topical steroid cream for treatment this can be considered at a later date. 08/22/17 on evaluation today patient appears to actually be doing rather well in regard to his left lateral lower extremity ulcer. He has actually been managed by Dr. Dellia Nims most recently. Patient is currently on oral steroids at this time. This seems to have been of benefit for him. Nonetheless his last visit was actually with Leah on 08/06/17. Currently he is not utilizing any topical steroid creams although this could be of benefit as well. No fevers, chills, nausea, or vomiting noted at this time. 09/05/17 on evaluation today patient appears to be doing better in regard to his left lateral lower extremity ulcer. He has been tolerating the dressing changes without complication. He is using Santyl with good effect. Overall I'm very  pleased with how things are standing at this point. Patient likewise is happy that this is doing better. 09/19/17 on evaluation today patient actually appears to be doing rather well in regard to his left lateral lower extremity ulcer. 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 He's not having any significant pain. 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 He has been tolerating the Santyl without complication and in general I feel like he's making good progress. 10/17/17 on evaluation today patient  appears to be doing very well in regard to his left lateral lower surety ulcer. He has been tolerating the dressing changes without complication. There does not appear to be any evidence of infection he's alternating the Santyl and the triple antibiotic ointment every other day this seems to be doing well for him. 11/03/17 on evaluation today patient appears to be doing very well in regard to his left lateral lower extremity ulcer. He is been tolerating the dressing changes without complication which is good news. Fortunately there does not appear to be any evidence of infection which is also great news. Overall is doing excellent they are starting to taper down on the prednisone is down to 40 mg at this point it also started topical clobetasol for him. 11/17/17 on evaluation today patient appears to be doing well in regard to his left lateral lower surety ulcer. He's been tolerating the dressing changes without complication. He does note that he is having no pain, no excessive drainage or discharge, and overall he feels like things are going about how he would expect and hope they would. Overall he seems to have no evidence of infection at this time in my opinion which is good news. 12/04/17-He is seen in follow-up evaluation  for right lateral lower extremity ulcer. He has been applying topical steroid cream. Today's measurement show slight increase in size. Over the next 2 weeks we will transition to every other day Santyl and steroid cream. He has been encouraged to monitor for changes and notify clinic with any concerns 12/15/17 on evaluation today patient's left lateral motion the ulcer and fortunately is doing worse again at this point. This just since last week to this week has close to doubled in size according to the patient. I did not seeing last week's I do not have a visual to compare this to in our system was also down so we do not have all the charts and at this point. Nonetheless it does have me somewhat concerned in regard to the fact that again he was worried enough about it he has contact the dermatology that placed them back on the full strength, 50 mg a day of the prednisone that he was taken previous. He continues to alternate using clobetasol along with Santyl at this point. He is obviously somewhat frustrated. 12/22/17 on evaluation today patient appears to be doing a little worse compared to last evaluation. Unfortunately the wound is a little deeper and slightly larger than the last week's evaluation. With that being said he has made some progress in regard to the irritation surrounding at this time unfortunately despite that progress that's been made he still has a significant issue going on here. 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 NATNAEL, BIEDERMAN ([id number]) For that reason I discussed with him today the possibility of perform a wound culture to ensure there's nothing overtly infected. 01/06/18 on evaluation today patient's wound is larger and deeper than previously evaluated. With that being said it did appear that his wound was infected after  my last evaluation with him. Subsequently I did end up prescribing a prescription for Bactrim DS which she has been taking and having no complication with. Fortunately there does not appear to be any evidence of infection at this point in time as far as anything spreading, no want to touch, and overall I feel like  things are showing signs of improvement. 01/13/18 on evaluation today patient appears to be even a little larger and deeper than last time. There still muscle exposed in the base of the wound. Nonetheless he does appear to be less erythematous I do believe inflammation is calming down also believe the infection looks like it's probably resolved at this time based on what I'm seeing. No fevers, chills, nausea, or vomiting noted at this time. 01/30/18 on evaluation today patient actually appears to visually look better for the most part. Unfortunately those visually this looks better he does seem to potentially have what may be an abscess in the muscle that has been noted in the central portion of the wound. This is the first time that I have noted what appears to be fluctuance in the central portion of the muscle. With that being said I'm somewhat more concerned about the fact that this might indicate an abscess formation at this location. I do believe that an ultrasound would be appropriate. This is likely something we need to try to do as soon as possible. He has been switch to mupirocin ointment and he is no longer using the steroid ointment as prescribed by dermatology he sees them again next week he's been decreased from 60 to 40 mg of prednisone. 03/09/18 on evaluation today patient actually appears to be doing a little better compared to last time I saw him. There's not as much erythema surrounding the wound itself. He I did review his most recent infectious disease note which was dated 02/24/18. He saw Dr. Michel Bickers in [place]. 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 Patient is now off of antibiotics and they are continually observing this. There seems to be no change in the past two weeks in my pinion based on what the patient says and what I see today compared to what Dr. Megan Salon likely saw two weeks ago. No fevers, chills, nausea, or vomiting noted at this time. 03/23/18 on evaluation today patient's wound actually appears to be showing signs of improvement which is good news. He is currently still on the Dapsone. He is also working on tapering the prednisone to get off of this and Dr. Lottie Rater is working with him in this regard. Nonetheless overall I feel like the wound is doing well it does appear based on the infectious disease note that I reviewed from Dr. 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 I did also review the results of his ultrasound of left lower extremity which revealed there was a dentist tissue in the base of the wound without an abscess noted. 04/06/18 on evaluation today the patient's left lateral lower extremity ulcer actually appears to be doing fairly well which is excellent news. There does not appear to be any evidence of infection at this time which is also great news. Overall he still does have a significantly large ulceration although little by little he seems to be making progress. He is down to 10 mg a day of the prednisone. 04/20/18 on evaluation today patient actually appears to be doing excellent at this time in regard to his left lower extremity ulcer. He's making signs of good progress unfortunately this is taking much longer than we would really like to see but nonetheless he is  making progress. Fortunately there does not appear to be any evidence of infection at this time. No fevers, chills, nausea, or vomiting noted at this time.  The patient has not been using the Santyl due to the cost he hadn't got in this field yet. He's mainly been using the antibiotic ointment topically. Subsequently he also tells me that he really has not been scrubbing in the shower I think this would be helpful again as I told him it doesn't have to be anything too aggressive to even make it believe just enough to keep it free of some of the loose slough and biofilm on the wound surface. 05/11/18 on evaluation today patient's wound appears to be making slow but sure progress in regard to the left lateral lower extremity ulcer. He is been tolerating the dressing changes without complication. Fortunately there does not appear to be any evidence of infection at this time. He is still just using triple antibiotic ointment along with clobetasol occasionally over the area. He never got the Santyl and really does not seem to intend to in my pinion. 06/01/18 on evaluation today patient appears to be doing a little better in regard to his left lateral lower extremity ulcer. He states that overall he does not feel like he is doing as well with the Dapsone as he did with the prednisone. Nonetheless he sees his dermatologist later today and is gonna talk to them about the possibility of going back on the prednisone. Overall again I believe that the wound would be better if you would utilize Santyl but he really does not seem to be interested in going back to the [facility] at this point. He has been using triple antibiotic ointment. 06/15/18 on evaluation today patient's wound actually appears to be doing about the same at this point. Fortunately there is no signs of infection at this time. He has made slight improvements although he continues to not really want to clean the wound bed at this point. He states that he just doesn't mess with it he doesn't want to cause any problems with everything else he has going on. He has been on medication, antibiotics as prescribed  by his dermatologist, for a staff infection of his lower extremities which is really drying out now and looking much better he tells me. Fortunately there is no sign of overall infection. 06/29/18 on evaluation today patient appears to be doing well in regard to his left lateral lower surety ulcer all things considering. Fortunately his staff infection seems to be greatly improved compared to previous. He has no signs of infection and this is drying up quite nicely. He is still the doxycycline for this is no longer on cental, Dapsone, or any of the other medications. His dermatologist has recommended possibility of an infusion but right now he does not want to proceed with that. 07/13/18 on evaluation today patient appears to be doing about the same in regard to his left lateral lower surety ulcer. Fortunately there's no signs of infection at this time which is great news. Unfortunately he still builds up a significant amount of Slough/biofilm of the surface of the wound he still is not really cleaning this as he should be appropriately. Again I'm able to easily with saline and gauze remove the majority of this on the surface which if you would do this at home would likely be a dramatic improvement for him as far as getting the area to improve. Nonetheless overall I still feel like he  is making progress is just very slow. I think Santyl will be of benefit for him as well. Still he has not gotten this as of this point. 07/27/18 on evaluation today patient actually appears to be doing little worse in regards of the erythema around the periwound region of the wound he also tells me that he's been having more drainage currently compared to what he was experiencing last time I saw him. He states not quite as bad as what he had because this was infected previously but nonetheless is still appears to be doing poorly. Fortunately there is no evidence of systemic infection at this point. The patient tells me that  he is not going to be able to afford the Santyl. He is still waiting to hear about the infusion therapy with his dermatologist. Apparently she wants an updated colonoscopy first. 08/10/18 on evaluation today patient appears to be doing better in regard to his left lateral lower extremity ulcer. Fortunately he is showing signs of improvement in this regard he's actually been approved for Remicade infusion's as well although this has not been scheduled as of yet. Fortunately there's no signs of active infection at this time in regard to the wound although he is having some issues with infection of the right lower extremity is been seen as dermatologist for this. Fortunately they are definitely still working with him trying to keep things under control. HARNOOR, KOHLES ([id number]) 09/07/18 on evaluation today patient is actually doing rather well in regard to his left lateral lower extremity ulcer. He notes these actually having some hair grow back on his extremity which is something he has not seen in years. He also tells me that the pain is really not giving them any trouble at this time which is also good news overall she is very pleased with the progress he's using a combination of the mupirocin along with the probate is all mixed. 09/21/18 on evaluation today patient actually appears to be doing fairly well all things considered in regard to his looks from the ulcer. He's been tolerating the dressing changes without complication. Fortunately there's no signs of active infection at this time which is good news he is still on all antibiotics or prevention of the staff infection. He has been on prednisone for time although he states it is gonna contact his dermatologist and see if she put them on a short course due to some irritation that he has going on currently. Fortunately there's no evidence of any overall worsening this is going very slow I think cental would be something that would be helpful for him  although he states that $50 for tube is quite expensive. He therefore is not willing to get that at this point. 10/06/18 on evaluation today patient actually appears to be doing decently well in regard to his left lateral leg ulcer. He's been tolerating the dressing changes without complication. Fortunately there's no signs of active infection at this time. Overall I'm actually rather pleased with the progress he's making although it's slow he doesn't show any signs of infection and he does seem to be making some improvement. I do believe that he may need a switch up and dressings to try to help this to heal more appropriately and quickly. 10/19/18 on evaluation today patient actually appears to be doing better in regard to his left lateral lower extremity ulcer. This is shown signs of having much less Slough buildup at this point due to the fact he has been using  the Santyl. Obviously this is very good news. The overall size of the wound is not dramatically smaller but again the appearance is. 11/02/18 on evaluation today patient actually appears to be doing quite well in regard to his lower [place] ulcer. A lot of the skin around the ulcer is actually somewhat irritating at this point this seems to be more due to the dressing causing irritation from the adhesive that anything else. Fortunately there is no signs of active infection at this time. 11/24/18 on evaluation today patient appears to be doing a little worse in regard to his overall appearance of his lower extremity ulcer. There's more erythema and warmth around the wound unfortunately. He is currently on doxycycline which he has been on for some time. With that being said I'm not sure that seems to be helping with what appears to possibly be an acute cellulitis with regard to his left lower extremity ulcer. No fevers, chills, nausea, or vomiting noted at this time. 12/08/18 on evaluation today patient's wounds actually appears to be doing  significantly better compared to his last evaluation. He has been using Santyl along with alternating tripling about appointment as well as the steroid cream seems to be doing quite well and the wound is showing signs of improvement which is excellent news. Fortunately there's no evidence of infection and in fact his culture came back negative with only normal skin flora noted. 12/21/2018 upon evaluation today patient actually appears to be doing excellent with regard to his ulcer. This is actually the best that I have seen it since have been helping to take care of him. It is both smaller as well as less slough noted on the surface of the wound and seems to be showing signs of good improvement with new skin growing from the edges. He has been using just the triamcinolone he does wonder if he can get a refill of that ointment today. 01/04/2019 upon evaluation today patient actually appears to be doing well with regard to his left lateral lower extremity ulcer. With that being said it does not appear to be that he is doing quite as well as last time as far as progression is concerned. There does not appear to be any signs of infection or significant irritation which is good news. With that being said I do believe that he may benefit from switching to a collagen based dressing based on how clean The wound appears. 01/18/2019 on evaluation today patient actually appears to be doing well with regard to his wound on the left lower extremity. He is not made a lot of progress compared to where we were previous but nonetheless does seem to be doing okay at this time which is good news. There is no signs of active infection which is also good news. My only concern currently is I do wish we can get him into utilizing the collagen dressing his insurance would not pay for the supplies that we ordered although it appears that he may be able to order this through his supply company that he typically utilizes. This is  [employer]. Nonetheless he did try to order it during the office visit today and it appears this did go through. We will see if he can get that it is a different brand but nonetheless he has collagen and I do think will be beneficial. 02/01/2019 on evaluation today patient actually appears to be doing a little worse today in regard to the overall size of his wounds. Fortunately there  is no signs of active infection at this time. That is visually. Nonetheless when this is happened before it was due to infection. For that reason were somewhat concerned about that this time as well. 02/08/2019 on evaluation today patient unfortunately appears to be doing slightly worse with regard to his wound upon evaluation today. Is measuring a little deeper and a little larger unfortunately. I am not really sure exactly what is causing this to enlarge he actually did see his dermatologist she is going to see about initiating Humira for him. Subsequently she also did do steroid injections into the wound itself in the periphery. Nonetheless still nonetheless he seems to be getting a little bit larger he is gone back to just using the steroid cream topically which I think is appropriate. I would say hold off on the collagen for the time being is definitely a good thing to do. Based on the culture results which we finally did get the final result back regarding it shows staph as the bacteria noted again that can be a normal skin bacteria based on the fact however he is having increased drainage and worsening of the wound measurement wise I would go ahead and place him on an antibiotic today I do believe for this. 02/15/2019 on evaluation today patient actually appears to be doing somewhat better in regard to his ulcer. There is no signs of worsening at this time I did review his culture results which showed evidence of Staphylococcus aureus but not MRSA. Again this could just be more related to the normal skin bacteria  although he states the drainage has slowed down quite a bit he may have had a mild infection not just colonization. And was much smaller and then since around10/04/2019 on evaluation today patient appears to be doing unfortunately worse as far as the size of the wound. I really feel like that this is steadily getting larger again it had been doing excellent right at the beginning of September we have seen a steady increase in the area of the wound it is almost 2-1/2 times the size it was on September 1. Obviously this is a bad trend this is not wanting to see. For that reason we went back to using just the topical triamcinolone cream which does seem to help with inflammation. I checked him for bacteria by way of culture and nothing showed positive there. I am considering giving him a short course of a tapering steroid Dosepak today to see if that is can be beneficial for him. The patient is in agreement with giving that a try. 03/08/2019 on evaluation today patient appears to be doing very well in comparison to last evaluation with regard to his lower extremity ulcer. This is showing signs of less inflammation and actually measuring slightly smaller compared to last time every other week over the past month and a half he has been measuring larger larger larger. Nonetheless I do believe that the issue has been inflammation the prednisone does seem to [facility], Shlomo ([id number]) have been beneficial for him which is good news. No fevers, chills, nausea, vomiting, or diarrhea. 03/22/2019 on evaluation today patient appears to be doing about the same with regard to his leg ulcer. He has been tolerating the dressing changes without complication. With that being said the wound seems to be mostly arrested at its current size but really is not making any progress except for when we prescribed the prednisone. He did show some signs of dropping as far as  the overall size of the wound during that interval week.  Nonetheless this is something he is not on long-term at this point and unfortunately I think he is getting need either this or else the Humira which his dermatologist has discussed try to get approval for. With that being said he will be seeing his dermatologist on the 11th of this month that is November. 04/19/2019 on evaluation today patient appears to be doing really about the same the wound is measuring slightly larger compared to last time I saw him. He has not been into the office since November 2 due to the fact that he unfortunately had Covid as that his entire family. He tells me that it was rough but they did pull-through and he seems to be doing much better. Fortunately there is no signs of active infection at this time. No fevers, chills, nausea, vomiting, or diarrhea. 05/10/2019 on evaluation today patient unfortunately appears to be doing significantly worse as compared to last time I saw him. He does tell me that he has had his first dose of Humira and actually is scheduled to get the next one in the upcoming week. With that being said he tells me also that in the past several days he has been having a lot of issues with green drainage she showed me a picture this is more blue-green in color. He is also been having issues with increased sloughy buildup and the wound does appear to be larger today. Obviously this is not the direction that we want everything to take based on the starting of his Humira. Nonetheless I think this is definitely a result of likely infection and to be honest I think this is probably Pseudomonas causing the infection based on what I am seeing. 05/24/2019 on evaluation today patient unfortunately appears to be doing significantly worse compared to his prior evaluation with me 2 weeks ago. I did review his culture results which showed that he does have Staph aureus as well as Pseudomonas noted on the culture. Nonetheless the Levaquin that I prescribed for him does  not appear to have been appropriate and in fact he tells me he is no longer experiencing the green drainage and discharge that he had at the last visit. Fortunately there is no signs of active infection at this time which is good news although the wound has significantly worsened it in fact is much deeper than it was previous. We have been utilizing up to this point triamcinolone ointment as the prescription topical of choice but at this time I really feel like that the wound is getting need to be packed in order to appropriately manage this due to the deeper nature of the wound. Therefore something along the lines of an alginate dressing may be more appropriate. 05/31/2019 upon inspection today patient's wound actually showed signs of doing poorly at this point. Unfortunately he just does not seem to be making any good progress despite what we have tried. He actually did go ahead and pick up the Cipro and start taking that as he was noticing more green drainage he had previously completed the Levaquin that I prescribed for him as well. Nonetheless he missed his appointment for the seventh last week on Wednesday with the wound care center and [facility] where his dermatologist referred him. Obviously I do think a second opinion would be helpful at this point especially in light of the fact that the patient seems to be doing so poorly despite the fact  that we have tried everything that I really know how at this point. The only thing that ever seems to have helped him in the past is when he was on high doses of continual steroids that did seem to make a difference for him. 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 06/07/2019 upon evaluation today patient unfortunately appears to be doing worse yet again with regard to his wound. In fact I am  starting to question whether or not he may have a fluid pocket in the muscle at this point based on the bulging and the soft appearance to the central portion of the muscle area. There is not anything draining from the muscle itself at this time which is good news but nonetheless the wound is expanding. I am not really seeing any results of the Humira as far as overall wound progression based on what I am seeing at this point. The patient has been referred for second opinion with regard to his wound to the [facility] wound care center by his dermatologist which I definitely am not in opposition to. Unfortunately we tried multiple dressings in the past including collagen, alginate, and at one point even Hydrofera Blue. With that being said he is never really used it for any significant amount of time due to the fact that he often complains of pain associated with these dressings and then will go back to either using the Santyl which she has done intermittently or more frequently the triamcinolone. He is also using his own compression stockings. We have wrapped him in the past but again that was something else that he really was not a big fan of. 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 He seemed to do much better when he was on the steroids although honestly I understand that the steroids are not necessarily the best medication to be on long-term obviously 06/14/2019 on evaluation today patient appears to be doing actually a little bit better with regard to the overall appearance with his leg. Unfortunately he does continue to have issues with what appears to be some fluid underneath the  muscle although he did see the wound specialty center at [facility] last week their main goals were to see about infusion therapy in place of the Humira as they feel like that is not quite strong enough. They also recommended that we continue with the treatment otherwise as we are they felt like that was appropriate and they are okay with him continuing to follow-up here with [place] in that regard. With that being said they are also sending him to the vein specialist there to see about vein stripping and if that would be of benefit for him. Subsequently they also did not really address whether or not an ultrasound of the muscle area to see if there is anything that needs to be addressed here would be appropriate or not. For that reason I discussed this with him last week I think we  may proceed down that road at this point. 06/21/2019 upon evaluation today patient's wound actually appears to be doing slightly better compared to previous evaluations. I do believe that he has made a difference with regard to the progression here with the use of oral steroids. Again in the past has been the only thing that is really calm things down. He does tell me that from [facility] is gotten a good news from there that there are no further vein stripping that is necessary at this point. I do not have that available for review today although the patient did relay this to me. He also did obtain and have the ultrasound of the wound completed which I did sign off on today. It does appear that there is no fluid collection under the muscle this is likely then just edematous tissue in general. That is also good news. Overall I still believe the inflammation is the main issue here. He did inquire about the possibility of a wound VAC again with the muscle protruding like it is I am not really sure whether the wound VAC is necessarily ideal or not. That is something we will have to consider although I do believe he may need compression wrapping to  try to help with edema control which could potentially be of benefit. 06/28/2019 on evaluation today patient appears to be doing slightly better measurement wise although this is not terribly smaller he least seems to be trending towards that direction. With that being said he still seems to have purulent drainage noted in the wound bed at this time. He has been on Levaquin followed by Cipro over the past month. Unfortunately he still seems to have some issues with active infection at this time. I did perform a culture last week in order to evaluate and see if indeed there was still anything going on. Subsequently the culture did come back showing Pseudomonas which is consistent with the drainage has been having which is blue-green in color. He also has had an odor that again was somewhat consistent with Pseudomonas as well. Long story short it appears that the culture showed an intermediate finding with regard to how well the Cipro will work for the Pseudomonas infection. Subsequently being that he does not seem to be clearing up and at best what we are doing is just keeping this at [place] I think he may need to see infectious disease to discuss IV antibiotic options. SHYLO, DILLENBECK ([id number]) 07/05/2019 upon evaluation today patient appears to be doing okay in regard to his leg ulcer. He has been tolerating the dressing changes at this point without complication. Fortunately there is no signs of active infection at this time which is good news. No fevers, chills, nausea, vomiting, or diarrhea. With that being said he does have an appointment with infectious disease tomorrow and his primary care on Wednesday. Again the reason for the infectious disease referral was due to the fact that he did not seem to be fully resolving with the use of oral antibiotics and therefore we were thinking that IV antibiotic therapy may be necessary secondary to the fact that there was an intermediate finding for  how effective the Cipro may be. Nonetheless again he has been having a lot of purulent and even green drainage. Fortunately right now that seems to have calmed down over the past week with the reinitiation of the oral antibiotic. Nonetheless we will see what Dr. Megan Salon has to say. 07/12/2019 upon evaluation today patient appears to be  doing about the same at this point in regard to his left lower extremity ulcer. Fortunately there is no signs of active infection at this time which is good news I do believe the Levaquin has been beneficial I did review Dr. Hale Bogus note and to be honest I agree that the patient's leg does appear to be doing better currently. What we found in the past as he does not seem to really completely resolve he will stop the antibiotic and then subsequently things will revert back to having issues with blue-green drainage, increased pain, and overall worsening in general. Obviously that is the reason I sent him back to infectious disease. 07/19/2019 upon evaluation today patient appears to be doing roughly the same in size there is really no dramatic improvement. He has started back on the Levaquin at this point and though he seems to be doing okay he did still have a lot of blue/green drainage noted on evaluation today unfortunately. I think that this is still indicative more likely of a Pseudomonas infection as previously noted and again he does see Dr. Megan Salon in just a couple of days. I do not know that were really able to effectively clear this with just oral antibiotics alone based on what I am seeing currently. Nonetheless we are still continue to try to manage as best we can with regard to the patient and his wound. I do think the wrap was helpful in decreasing the edema which is excellent news. No fevers, chills, nausea, vomiting, or diarrhea. 07/26/2019 upon evaluation today patient appears to be doing slightly better with regard to the overall appearance of the muscle  there is no dark discoloration centrally. Fortunately there is no signs of active infection at this time. No fevers, chills, nausea, vomiting, or diarrhea. Patient's wound bed currently the patient did have an appointment with Dr. Megan Salon at infectious disease last week. With that being said Dr. Megan Salon the patient states was still somewhat hesitant about put him on any IV antibiotics he wanted [place] to repeat cultures today and then see where things go going forward. He does look like Dr. Megan Salon because of some improvement the patient did have with the Levaquin wanted [place] to see about repeating cultures. If it indeed grows the Pseudomonas again then he recommended a possibility of considering a PICC line placement and IV antibiotic therapy. He plans to see the patient back in 1 to 2 weeks. 08/02/2019 upon evaluation today patient appears to be doing poorly with regard to his left lower extremity. We did get the results of his culture back it shows that he is still showing evidence of Pseudomonas which is consistent with the purulent/blue-green drainage that he has currently. Subsequently the culture also shows that he now is showing resistance to the oral fluoroquinolones which is unfortunate as that was really the only thing to treat the infection prior. I do believe that he is looking like this is going require IV antibiotic therapy to get this under control. Fortunately there is no signs of systemic infection at this time which is good news. The patient does see Dr. Megan Salon tomorrow. 08/09/2019 upon evaluation today patient appears to be doing better with regard to his left lower extremity ulcer in regard to the overall appearance. He is currently on IV antibiotic therapy. As ordered by Dr. Megan Salon. Currently the patient is on ceftazidime which she is going to take for the next 2 weeks and then follow-up for 4 to 5-week appointment with Dr. Megan Salon.  The patient started this this past Friday  symptoms have not for a total of 3 days currently in full. 08/16/2019 upon evaluation today patient's wound actually does show muscle in the base of the wound but in general does appear to be much better as far as the overall evidence of infection is concerned. In fact I feel like this is for the most part cleared up he still on the IV antibiotics he has not completed the full course yet but I think he is doing much better which is excellent news. 08/23/2019 upon evaluation today patient appears to be doing about the same with regard to his wound at this point. He tells me that he still has pain unfortunately. Fortunately there is no evidence of systemic infection at this time which is great news. There is significant muscle protrusion. 09/13/19 upon evaluation today patient appears to be doing about the same in regard to his leg unfortunately. He still has a lot of drainage coming from the ulceration there is still muscle exposed. With that being said the patient's last wound culture still showed an intermediate finding with regard to the Pseudomonas he still having the bluish/green drainage as well. Overall I do not know that the wound has completely cleared of infection at this point. Fortunately there is no signs of active infection systemically at this point which is good news. 09/20/2019 upon evaluation today patient's wound actually appears to be doing about the same based on what I am seeing currently. I do not see any signs of systemic infection he still does have evidence of some local infection and drainage. He did see Dr. Megan Salon last week and Dr. Megan Salon states that he probably does need a different IV antibiotic although he does not want to put him on this until the patient begins the Remicade infusion which is actually scheduled for about 10 days out from today on 13 May. Following that time Dr. Megan Salon is good to see him back and then will evaluate the feasibility of starting him on the  IV antibiotic therapy once again at that point. I do not disagree with this plan I do believe as Dr. Megan Salon stated in his note that I reviewed today that the patient's issue is multifactorial with the pyoderma being 1 aspect of this that were hoping the Remicade will be helpful for her. In the meantime I think the gentamicin is, helping to keep things under decent okay control in regard to the ulcer. 09/27/2019 upon evaluation today patient appears to be doing about the same with regard to his wound still there is a lot of muscle exposure though he does have some hyper granulation tissue noted around the edge and actually some granulation tissue starting to form over the muscle which is actually good news. Fortunately there is no evidence of active infection which is also good news. His pain is less at this point. 5/21; this is a patient I have not seen in a long time. 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 He has a large wound on the left lateral leg with protruding muscle. He comes in the clinic today showing the same area on his left medial ankle. He says there is been a spot there for some time although we have not previously defined this. Today he has a clearly defined area with slight amount of skin breakdown surrounded by raised areas with a purplish hue in color. This is not painful he says it is irritated.  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 He still has muscle protruding from the lateral portion of his left leg but fortunately the new area that they were concerned about at his last visit does not appear to have opened at this point. He is currently on Remicade infusions and seems to be doing better in my opinion in fact the wound itself seems to be overall much better. The purplish discoloration that he did have seems to have resolved  and I think that is a good sign that hopefully the Remicade is doing its job. He does have some biofilm noted over the surface of the wound. 11/01/2019 on evaluation today patient's wound actually appears to be doing excellent at this time. Fortunately there is no evidence of active infection and overall I feel like he is making great progress. The Remicade seems to be due excellent job in my opinion. JHON, MALLOZZI ([id number]) 11/08/19 evaluation today vision actually appears to be doing quite well with regard to his weight ulcer. He's been tolerating dressing changes without complication. Fortunately there is no evidence of infection. No fevers, chills, nausea, or vomiting noted at this time. Overall states that is having more itching than pain which is actually a good sign in my opinion. 12/13/2019 upon evaluation today patient appears to be doing well today with regard to his wound. He has been tolerating the dressing changes without complication. Fortunately there is no sign of active infection at this time. No fevers, chills, nausea, vomiting, or diarrhea. Overall I feel like the infusion therapy has been very beneficial for him. 01/06/2020 on evaluation today patient appears to be doing well with regard to his wound. This is measuring smaller and actually looks to be doing better. Fortunately there is no signs of active infection at this point. No fevers, chills, nausea, vomiting, or diarrhea. With that being said he does still have the blue-green drainage but this does not seem to be causing any significant issues currently. He has been using the gentamicin that does seem to be keeping things under decent control at this point. He goes later this morning for his next infusion therapy for the pyoderma which seems to also be very beneficial. 02/07/2020 on evaluation today patient appears to be doing about the same in regard to his wounds currently. Fortunately there is no signs of active infection  systemically he does still have evidence of local infection still using gentamicin. He also is showing some signs of improvement albeit slowly I do feel like we are making some progress here. 02/21/2020 upon evaluation today patient appears to be making some signs of improvement the wound is measuring a little bit smaller which is great news and overall I am very pleased with where he stands currently. He is going to be having infusion therapy treatment on the 15th of this month. Fortunately there is no signs of active infection at this time. 03/13/2020 I do believe patient's wound is actually showing some signs of improvement here which is great news. He has continue with the infusion therapy through rheumatology/dermatology at [facility]. That does seem to be beneficial. I still think he gets as much benefit from this as he did from the prednisone initially but nonetheless obviously this is less harsh on his body that the prednisone as far as they are concerned. 03/31/2020 on evaluation today patient's wound actually showing signs of some pretty good improvement in regard  to the overall appearance of the wound bed. There is still muscle exposed though he does have some epithelial growth around the edges of the wound. Fortunately there is no signs of active infection at this time. No fevers, chills, nausea, vomiting, or diarrhea. 04/24/2020 upon evaluation today patient appears to be doing about the same in regard to his leg ulcer. He has been tolerating the dressing changes without complication. Fortunately there is no signs of active infection at this time. No fevers, chills, nausea, vomiting, or diarrhea. With that being said he still has a lot of irritation from the bandaging around the edges of the wound. We did discuss today the possibility of a referral to plastic surgery. 05/22/2020 on evaluation today patient appears to be doing well with regard to his wounds all things considered. He has not been able  to get the Chantix apparently there is a recall nurse that I was unaware of put out by [employer] involuntarily. Nonetheless for now I am and I have to do some research into what may be the best option for him to help with quitting in regard to smoking and we discussed that today. 06/26/2020 upon evaluation today patient appears to be doing well with regard to his wound from the standpoint of infection I do not see any signs of infection at this point. With that being said unfortunately he is still continuing to have issues with muscle exposure and again he is not having a whole lot of new skin growth unfortunately. There does not appear to be any signs of active infection at this time. No fevers, chills, nausea, vomiting, or diarrhea. 07/10/2020 upon evaluation today patient appears to be doing a little bit more poorly currently compared to where he was previous. I am concerned currently about an active infection that may be getting worse especially in light of the increased size and tenderness of the wound bed. No fevers, chills, nausea, vomiting, or diarrhea. 07/24/2020 upon evaluation today patient appears to be doing poorly in regard to his leg ulcer. He has been tolerating the dressing changes without complication but unfortunately is having a lot of discomfort. 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 Subsequently I think he is going require possibly IV antibiotics to get this under control. I am very concerned about the severity of his infection and the amount of discomfort he is having. 07/31/2020 upon evaluation today patient appears to be doing about the same in regard to his leg wound. He did see Dr. Megan Salon and Dr. Megan Salon is actually going to start him on IV antibiotics. He goes for the PICC line tomorrow. With that being said there do not have that run for 2 weeks and then see how things are doing and depending on how he  is progressing they may extend that a little longer. Nonetheless I am glad this is getting ready to be in place and definitely feel it may help the patient. In the meantime is been using mainly triamcinolone to the wound bed has an anti-inflammatory. 08/07/2020 on evaluation today patient appears to be doing well with regard to his wound compared even last week. In the interim he has gotten the PICC line placed and overall this seems to be doing excellent. There does not appear to be any evidence of infection which is great news systemically although locally of course has had the infection this appears to be improving with the use of the antibiotics. 08/14/2020 upon  evaluation today patient's wound actually showing signs of excellent improvement. Overall the irritation has significantly improved the drainage is back down to more of a normal level and his pain is really pretty much nonexistent compared to what it was. Obviously I think that this is significantly improved secondary to the IV antibiotic therapy which has made all the difference in the world. Again he had a resistant form of Pseudomonas for which oral antibiotics just was not cutting it. Nonetheless I do think that still we need to consider the possibility of a surgical closure for this wound is been open so long and to be honest with muscle exposed I think this can be very hard to get this to close outside of this although definitely were still working to try to do what we can in that regard. 08/21/2020 upon evaluation today patient appears to be doing very well with regard to his wounds on the left lateral lower extremity/calf area. Fortunately there does not appear to be signs of active infection which is great news and overall very pleased with where things stand today. He is actually wrapping up his treatment with IV antibiotics tomorrow. After that we will see where things go from there. 08/28/2020 upon evaluation today patient appears  to be doing decently well with regard to his leg ulcer. There does not appear to be any signs of active infection which is great news and overall very pleased with where things stand today. No fevers, chills, nausea, vomiting, or diarrhea. 09/18/2020 upon evaluation today patient appears to be doing well with regard to his infection which I feel like is better. Unfortunately he is not doing as well with regard to the overall size of the wound which is not nearly as good at this point. I feel like that he may be having an issue here with the pyoderma being somewhat out of control. I think that he may benefit from potentially going back and talking to the dermatologist about EMMERT, ROETHLER ([id number]) what to do from the pyoderma standpoint. I am not certain if the infusions are helping nearly as much is what the prednisone did in the past. 10/02/2020 upon evaluation today patient appears to be doing well with regard to his leg ulcer. He did go to the [facility]iatric nurse. Unfortunately they feel like there is a 10% chance that most that he would be able to heal and that the skin graft would take. Obviously this has led him to not be able to go down that path as far as treatment is concerned. Nonetheless he does seem to be doing a little bit better with the prednisone that I gave him last time. I think that he may need to discuss with dermatology the possibility of long-term prednisone as that seems to be what is most helpful for him to be perfectly honest. I am not sure the Remicade is really doing the job. 10/17/2020 upon evaluation today patient appears to be doing a little better in regard to his wound. In fact the case has been since we did the prednisone on May 2 for him that we have noticed a little bit of improvement each time we have seen a size wise as well as appearance wise as well as pain wise. I think the prednisone has had a greater effect then the infusion therapy has to be perfectly honest. With  that being said the patient also feels significantly better compared to what he was previous. All of this is good news but  nonetheless I am still concerned about the fact that again we are really not set up to long-term manage him as far as prednisone is concerned. Obviously there are things that you need to be watched I completely understand the risk of prednisone usage as well. 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 With all that being said I do believe that we can give him another round of the prednisone which she is requesting today because of the improvement that he seen since we did that first round. 10/30/2020 upon evaluation today patient's wound actually is showing signs of doing quite well. There does not appear to be any evidence of infection which is great news and overall very pleased with where things stand today. No fevers, chills, nausea, vomiting, or diarrhea. He tells me that the prednisone still has seem to have helped he wonders if we can extend that for just a little bit longer. He did not have the appointment with a dermatologist although he did have an infusion appointment last Friday. That was at [facility]. With that being said he tells me he could not do both that as well as the appointment with the physician on the same day therefore that is can have to be rescheduled. I really want to see if there is anything they feel like that could be done differently to try to help this out as I am not really certain that the infusions are helping significantly here. 11/13/2020 upon evaluation today patient unfortunately appears to be doing somewhat poorly in regard to his wound I feel like this is actually worsening from the standpoint of the pyoderma spreading. I still feel like that he may need something different as far as trying to manage this going forward. Again we did the prednisone unfortunately his blood sugars are not doing so well  because of this. Nonetheless I believe that the patient likely needs to try topical steroid. We have done triamcinolone for a while I think going with something stronger such as clobetasol could be beneficial again this is not something I do lightly I discussed this with the patient that again this does not normally put underneath an occlusive dressing. Nonetheless I think a thin film as such could help with some of the stronger anti-inflammatory effects. We discussed this today. He would like to try to give this a trial for the next couple weeks. I definitely think that is something that we can do. Evaluate7/03/2021 and today patient's wound bed actually showed signs of doing really about the same. There was a little expansion of the size of the wound and that leading edge that we done looking out although the clobetasol does seem to have slowed this down a bit in my opinion. There is just 1 small area that still seems to be progressing based on what I see. Nonetheless I am concerned about the fact this does not seem to be improving if anything seems to be doing a little bit worse. I do not know that the infusions are really helping him much as next infusion is August 5 his appointment with dermatology is July 25. Either way I really think that we need to have a conversation potentially about this and I am actually going to see if I can talk with Dr. Lillia Carmel in order to see where things stand as well. 12/11/2020 upon evaluation today patient appears to be doing worse in regard to his leg ulcer. Unfortunately  I just do not think this is making the progress that I would like to see at this point. Honestly he does have an appointment with dermatology and this is in 2 days. I am wondering what they may have to offer to help with this. Right now what I am seeing is that he is continuing to show signs of worsening little by little. Obviously that is not great at all. Is the exact opposite of what we are  looking for. 12/18/2020 upon evaluation today patient appears to be doing a little better in regard to his wound. The dermatologist actually did do some steroid injections into the wound which does seem to have been beneficial in my opinion. That was on the 25th already this looks a little better to me than last time I saw him. With that being said we did do a culture and this did show that he has Staph aureus noted in abundance in the wound. With that being said I do think that getting him on an oral antibiotic would be appropriate as well. Also think we can compression wrap and this will make a difference as well. 12/28/2020 upon evaluation today patient's wound is actually showing signs of doing much better. I do believe the compression wrap is helping he has a lot of drainage but to be honest I think that the compression is helping to some degree in this regard as well as not draining through which is also good news. No fevers, chills, nausea, vomiting, or diarrhea. 01/04/2021 upon evaluation today patient appears to be doing well with regard to his wound. Overall things seem to be doing quite well. He did have a little bit of reaction to the CarboFlex Sorbact he will be using that any longer. With that being said he is controlled as far as the drainage is concerned overall and seems to be doing quite well. I do not see any signs of active infection at this time which is great news. No fevers, chills, nausea, vomiting, or diarrhea. 01/11/2021 upon evaluation today patient appears to be doing well with regard to his wounds. He has been tolerating the dressing changes without complication. Fortunately there does not appear to be any signs of active infection at this time which is great news. Overall I am extremely pleased with where we stand currently. No fevers, chills, nausea, vomiting, or diarrhea. Where using clobetasol in the wound bed he has a lot of new skin growth which is awesome as  well. 01/18/2021 upon evaluation today patient appears to be doing very well in regard to his leg ulcer. He has been tolerating the dressing changes without complication. Fortunately there does not appear to be any signs of active infection which is great news. In general I think that he is making excellent progress 01/25/2021 upon evaluation today patient appears to be doing well with regard to his wound on the leg. I am actually extremely pleased with where things stand today. There does not appear to be any signs of active infection which is great news and overall I think that we are definitely headed in the appropriate direction based on what I am seeing currently. There does not appear to be any signs of active infection also excellent news. 02/06/2021 upon evaluation today patient appears to be doing well with regard to his wound. Overall visually this is showing signs of significant improvement which is great news. I do not see any signs of active infection systemically which is great even locally  I do not think that we are seeing any major complications here. We did do fluorescence imaging with the [employer] today. The patient does have some odor and drainage noted and again this is something that I think would benefit him to probably come more frequently for nurse visits. 02/19/2021 upon evaluation today patient actually appears to be doing quite well in regard to his wound. He has been tolerating the dressing Stankovic, Jamone ([id number]) changes without complication and overall I think that this is making excellent progress. I do not see any evidence of active infection at this point which is great news as well. No fevers, chills, nausea, vomiting, or diarrhea. 10/10; wound is made nice progress healthy granulation with a nice rim of epithelialization which seems to be expanding even from last week he has a deeper area in the inferior part of the more distal part of the wound with not quite as  healthy as surface. This area will need to be followed. Using clobetasol and Hydrofera Blue 03/05/2021 upon evaluation today patient appears to be doing very well in regard to his leg ulcer. He has been tolerating dressing changes without complication. Fortunately there does not appear to be any signs of active infection which is great news and overall I am extremely pleased with where we stand currently. 03/12/2021 upon evaluation today patient appears to be doing well with regard to his wound in fact this is extremely extremely good based on what we are seeing today there does not appear to be any signs of active infection and overall I think that he is doing awesome from the standpoint of healing in general. I am extremely pleased with how things seem to be progressing with regard to this pyoderma. Clobetasol has done wonders for him. I think the compression wrapping has also been of great benefit. 03/19/2021 upon evaluation today patient appears to be doing well with regard to his wound. He is tolerating the dressing changes without complication. In fact I feel like that he is actually making excellent progress at this point based on what I am seeing. No fevers, chills, nausea, vomiting, or diarrhea. 03/26/2021 upon evaluation today patient appears to be doing well with regard to his wound. This again is measuring smaller and looking better. Again the progress is slow but nonetheless continual with what we have been seeing. I do believe that the current plan is doing awesome for him. 04/09/2021 upon evaluation today patient appears to be doing well with regard to his leg ulcer. This is showing signs of excellent improvement the muscle is completely closed over and there does not appear to be any evidence of inflammation at this point his drainage is significantly improved. Overall I think that he would be a good candidate for looking into a skin substitute at this point as well. We will get a look  into some approvals in that regard. Potentially TheraSkin as well as Apligraf could both be considered just depending on insurance coverage. 04/16/2021 upon evaluation today patient appears to be doing well at this point. He has been approved for the Apligraf which we could definitely order although I would like to try to get the TheraSkin approved if at all possible. I did fax notes into them today and I Georgina Peer try to give a call as well. Overall the wound appears to be doing decently well today. 04/20/2021 upon evaluation today patient actually appears to be doing quite well in regard to his wound with that being said we  are trying to see what we can do about speeding up the healing process. For that reason we did discuss the possibility of a skin substitute. We got the Apligraf approved. For that reason we will get a go ahead and see what we can do with the Apligraf at this point. I am still trying to get the TheraSkin approved but I have not heard anything from the insurance company yet I have called and talked to them earlier in the week and to be honest this was about half an hour that I spent on the phone they told me I would hear something within 10-15 business days. 04/27/2021 upon evaluation today patient appears to be doing excellent in regard to his wound. I do believe the Apligraf has been beneficial. With that being said he has had a little bit of increased pain has been a little bit concerned. I do want to go ahead and apply his steroid cream today the clobetasol and then put the Apligraf over I just do not think I want to risk not putting the clobetasol on based on what is been going on here currently. Patient voiced understanding. 05/04/2021 upon evaluation today patient is making excellent improvement. Overall there is definitely decrease in the size of the wound today and I am very pleased in that regard. I do not see any signs of active infection locally nor systemically at this  point. No fevers, chills, nausea, vomiting, or diarrhea. 05/11/2021 upon evaluation today patient appears to be doing well with regard to his wound. He has been tolerating Apligraf's without complication and is making excellent progress this is application #4 today. 12/30; patient here for Apligraf application. Appears to be doing well small wound there has been a lot of healing 05/28/2021 upon evaluation today patient appears to be doing well with regard to his wound. Has been tolerating the dressing changes without complication. Fortunately I do not see any signs of active infection locally nor systemically at this point which is great news. He is done with the Apligraf and the first round and overall this has filled in quite nicely I am not even certain he needs any additional at the moment. I would actually try to go back to clobetasol with [facility] which previously was doing well for him. 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 He is using clobetasol and Hydrofera Blue. Surface area of the wound is smaller and the surface looks very healthy. 06/11/2021 upon evaluation today patient actually appears to be showing signs of good improvement which is great news. Fortunately I do not see any signs of active infection at this time which is also excellent news. I do believe that the patient is doing well with the clobetasol and Hydrofera Blue. He does have some irritation and itching around the rest of the leg will use some triamcinolone over this area. 06/18/2021 upon evaluation today patient appears to be doing well with regard to his wound. He is showing signs of excellent improvement and overall I am extremely pleased with where we stand today. We are moving in the right direction. Electronic Signature(s) Signed: 06/18/2021 9:50:18 AM By: Worthy Keeler PA-C Entered By: Worthy Keeler on 06/18/2021 09:50:18 DEMETRIES, COIA  ([id number]) -------------------------------------------------------------------------------- Physical Exam Details Patient Name: [name] Date of Service: 06/18/2021 8:15 AM Medical Record Number: [id number] Patient Account Number: [id number] Date of Birth/Sex: 1980-05-21 (40 y.o. M) Treating RN: [name] Primary Care Provider: [name]  Other Clinician: Referring Provider: [name] Treating Provider/Extender: [name] Cliche in Treatment: 24 Constitutional Well-nourished and well-hydrated in no acute distress. Respiratory normal breathing without difficulty. Psychiatric this patient is able to make decisions and demonstrates good insight into disease process. Alert and Oriented x 3. pleasant and cooperative. Notes Upon inspection patient's wound bed showed signs of good granulation and epithelization at this point. I did not see any evidence of active infection locally nor systemically at this time which is great news and overall I am extremely happy with where we stand currently. Electronic Signature(s) Signed: 06/18/2021 9:50:44 AM By: Worthy Keeler PA-C Entered By: Worthy Keeler on 06/18/2021 09:50:44 COLLIER, BOHNET ([id number]) -------------------------------------------------------------------------------- Physician Orders Details Patient Name: [name] Date of Service: 06/18/2021 8:15 AM Medical Record Number: [id number] Patient Account Number: [id number] Date of Birth/Sex: [birth year] (40 y.o. M) Treating RN: [name] Primary Care Provider: [name] Other Clinician: Referring Provider: [name] Treating Provider/Extender: [name] Cliche in Treatment: [id number] Verbal / Phone Orders: No Diagnosis Coding ICD-10 Coding Code Description I87.2 Venous insufficiency (chronic) (peripheral) L97.222 Non-pressure chronic ulcer of left calf with fat layer exposed E11.622 Type 2 diabetes mellitus with other skin ulcer L88 Pyoderma  gangrenosum F17.208 Nicotine dependence, unspecified, with other nicotine-induced disorders Follow-up Appointments o Return Appointment in 1 week. o Nurse Visit as needed - twice a week Bathing/ Shower/ Hygiene o Clean wound with Normal Saline or wound cleanser. o May shower with wound dressing protected with water repellent cover or cast protector. Edema Control - Lymphedema / Segmental Compressive Device / Other o Optional: One layer of unna paste to top of compression wrap (to act as an anchor). o Elevate, Exercise Daily and Avoid Standing for Long Periods of Time. o Elevate legs to the level of the heart and pump ankles as often as possible o Elevate leg(s) parallel to the floor when sitting. Wound Treatment Wound #1 - Lower Leg Wound Laterality: Left, Lateral Cleanser: Wound Cleanser 3 x Per Week/30 Days Discharge Instructions: Wash your hands with soap and water. Remove old dressing, discard into plastic bag and place into trash. Cleanse the wound with Wound Cleanser prior to applying a clean dressing using gauze sponges, not tissues or cotton balls. Do not scrub or use excessive force. Pat dry using gauze sponges, not tissue or cotton balls. Peri-Wound Care: Moisturizing Lotion 3 x Per Week/30 Days Discharge Instructions: Suggestions: Theraderm, Eucerin, Cetaphil, or patient preference. Topical: Clobetasol Propionate ointment 0.05%, 60 (g) tube 3 x Per Week/30 Days Discharge Instructions: Pt to bring to appt- Topical: Triamcinolone Acetonide Cream, 0.1%, 15 (g) tube 3 x Per Week/30 Days Discharge Instructions: Apply as directed by provider. To leg not on wound Primary Dressing: Hydrofera Blue Ready Transfer Foam, 4x5 (in/in) 3 x Per Week/30 Days Discharge Instructions: Apply Hydrofera Blue Ready to wound bed as directed Secondary Dressing: Zetuvit Plus Silicone Non-bordered 5x5 (in/in) 3 x Per Week/30 Days Compression Wrap: Medichoice 4 layer Compression System,  35-40 mmHG (Generic) 3 x Per Week/30 Days Discharge Instructions: Apply multi-layer wrap as directed. Electronic Signature(s) Signed: 06/18/2021 5:27:53 PM By: Worthy Keeler PA-C Signed: 07/02/2021 7:47:47 AM By: [name] [name], RN, [name], [name], [name] Entered By: [name], [name], RN, [name], [name] on 06/18/2021 08:38:55 SI, JACHIM ([id number]) DELWIN, RACZKOWSKI ([id number]) -------------------------------------------------------------------------------- Problem List Details Patient Name: [name] Date of Service: 06/18/2021 8:15 AM Medical Record Number: [id number] Patient Account Number: [id number] Date of Birth/Sex: May 24, 1980 (40 y.o. M) Treating RN: [name] Primary Care Provider:  [name] Other Clinician: Referring Provider: [name] Treating Provider/Extender: [name] Cliche in Treatment: 236 Active Problems ICD-10 Encounter Code Description Active Date MDM Diagnosis I87.2 Venous insufficiency (chronic) (peripheral) 12/04/2016 No Yes L97.222 Non-pressure chronic ulcer of left calf with fat layer exposed 12/04/2016 No Yes E11.622 Type 2 diabetes mellitus with other skin ulcer 04/09/2021 No Yes L88 Pyoderma gangrenosum 03/26/2017 No Yes F17.208 Nicotine dependence, unspecified, with other nicotine-induced disorders 04/24/2020 No Yes Inactive Problems ICD-10 Code Description Active Date Inactive Date L97.213 Non-pressure chronic ulcer of right calf with necrosis of muscle 04/02/2017 04/02/2017 Resolved Problems ICD-10 Code Description Active Date Resolved Date L97.321 Non-pressure chronic ulcer of left ankle limited to breakdown of skin 10/08/2019 10/08/2019 L03.116 Cellulitis of left lower limb 05/24/2019 05/24/2019 Electronic Signature(s) Signed: 06/18/2021 8:24:07 AM By: Worthy Keeler PA-C Entered By: Worthy Keeler on 06/18/2021 08:24:06 [name] ([id number]) -------------------------------------------------------------------------------- Progress Note  Details Patient Name: [name] Date of Service: 06/18/2021 8:15 AM Medical Record Number: [id number] Patient Account Number: [id number] Date of Birth/Sex: Sep 03, 1980 (40 y.o. M) Treating RN: [name] Primary Care Provider: [name] Other Clinician: Referring Provider: [name] Treating Provider/Extender: [name] Cliche in Treatment: 236 Subjective Chief Complaint Information obtained from Patient He is here in follow up evaluation for LLE pyoderma ulcer History of Present Illness (HPI) 12/04/16; 41 year old man who comes into the clinic today for review of a wound on the posterior left calf. He tells me that is been there for about a year. He is not a diabetic he does smoke half a pack per day. He was seen in the ER on 11/20/16 felt to have cellulitis around the wound and was given clindamycin. An x-ray did not show osteomyelitis. The patient initially tells me that he has a milk allergy that sets off a pruritic itching rash on his lower legs which she scratches incessantly and he thinks that's what may have set up the wound. He has been using various topical antibiotics and ointments without any effect. He works in a [employer] and is on his feet all day. He does not have a prior history of wounds however he does have the rash on both lower legs the right arm and the ventral aspect of his left arm. These are excoriations and clearly have had scratching however there are of macular looking areas on both legs including a substantial larger area on the right leg. This does not have an underlying open area. There is no blistering. The patient tells me that 2 years ago in [place] in response to the rash on his legs he saw a dermatologist who told him he had a condition which may be pyoderma gangrenosum although I may be putting words into his mouth. He seemed to recognize this. On further questioning he admits to a 5 year history of quiesced. ulcerative colitis. He is not in  any treatment for this. He's had no recent travel 12/11/16; the patient arrives today with his wound and roughly the same condition we've been using silver alginate this is a deep punched out wound with some surrounding erythema but no tenderness. Biopsy I did did not show confirmed pyoderma gangrenosum suggested nonspecific inflammation and vasculitis but does not provide an actual description of what was seen by the pathologist. I'm really not able to understand this We have also received information from the patient's dermatologist in [place] notes from April 2016. This was a doctor Agarwal-antal. 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 He  was prescribed topical steroid high potency under occlusion which helped but at this point the patient did not have a deep punched out wound. 12/18/16; the patient's wound is larger in terms of surface area however this surface looks better and there is less depth. The surrounding erythema also is better. The patient states that the wrap we put on came off 2 days ago when he has been using his compression stockings. He we are in the process of getting a dermatology consult. 12/26/16 on evaluation today patient's left lower extremity wound shows evidence of infection with surrounding erythema noted. He has been tolerating the dressing changes but states that he has noted more discomfort. There is a larger area of erythema surrounding the wound. No fevers, chills, nausea, or vomiting noted at this time. With that being said the wound still does have slough covering the surface. He is not allergic to any medication that he is aware of at this point. In regard to his right lower extremity he had several regions that are erythematous and pruritic he wonders if there's anything we can do to help that. 01/02/17 I reviewed patient's wound culture which was obtained his visit last week. He was placed on doxycycline at that point. Unfortunately that does not  appear to be an antibiotic that would likely help with the situation however the pseudomonas noted on culture is sensitive to Cipro. Also unfortunately patient's wound seems to have a large compared to last week's evaluation. Not severely so but there are definitely increased measurements in general. He is continuing to have discomfort as well he writes this to be a seven out of 10. In fact he would prefer me not to perform any debridement today due to the fact that he is having discomfort and considering he has an active infection on the little reluctant to do so anyway. No fevers, chills, nausea, or vomiting noted at this time. 01/08/17; patient seems dermatology on September 5. I suspect dermatology will want the slides from the biopsy I did sent to their pathologist. I'm not sure if there is a way we can expedite that. In any case the culture I did before I left on vacation 3 weeks ago showed Pseudomonas he was given 10 days of Cipro and per her description of her intake nurses is actually somewhat better this week although the wound is quite a bit bigger than I remember the last time I saw this. He still has 3 more days of Cipro 01/21/17; dermatology appointment tomorrow. He has completed the ciprofloxacin for Pseudomonas. Surface of the wound looks better however he is had some deterioration in the lesions on his right leg. Meantime the left lateral leg wound we will continue with sample 01/29/17; patient had his dermatology appointment but I can't yet see that note. He is completed his antibiotics. The wound is more superficial but considerably larger in circumferential area than when he came in. This is in his left lateral calf. He also has swollen erythematous areas with superficial wounds on the right leg and small papular areas on both arms. There apparently areas in her his upper thighs and buttocks I did not look at those. Dermatology biopsied the right leg. Hopefully will have their input  next week. 02/05/17; patient went back to see his dermatologist who told him that he had a "scratching problem" as well as staph. He is now on a 30 day course of doxycycline and I believe she gave him triamcinolone cream to the right leg  areas to help with the itching [not exactly sure but probably triamcinolone]. She apparently looked at the left lateral leg wound although this was not rebiopsied and I think felt to be ultimately part of the same pathogenesis. He is using sample border foam and changing nevus himself. He now has a new open area on the right posterior leg which was his biopsy site I don't have any of the dermatology notes 02/12/17; we put the patient in compression last week with SANTYL to the wound on the left leg and the biopsy. Edema is much better and the depth of the wound is now at level of skin. Area is still the same Biopsy site on the right lateral leg we've also been using santyl with a border foam dressing and he is changing this himself. 02/19/17; Using silver alginate started last week to both the substantial left leg wound and the biopsy site on the right wound. He is tolerating compression well. Has a an appointment with his primary M.D. tomorrow wondering about diuretics although I'm wondering if the edema problem is actually lymphedema AAYUSH, GELPI ([id number]) 02/26/17; the patient has been to see his primary doctor Dr. Jerrel Ivory at [facility] our primary care. She started him on Lasix 20 mg and this seems to have helped with the edema. However we are not making substantial change with the left lateral calf wound and inflammation. The biopsy site on the right leg also looks stable but not really all that different. 03/12/17; the patient has been to see vein and vascular Dr. Lucky Cowboy. He has had venous reflux studies I have not reviewed these. I did get a call from his dermatology office. They felt that he might have pathergy based on their biopsy on his right leg which  led them to look at the slides of the biopsy I did on the left leg and they wonder whether this represents pyoderma gangrenosum which was the original supposition in a man with ulcerative colitis albeit inactive for many years. They therefore recommended clobetasol and tetracycline i.e. aggressive treatment for possible pyoderma gangrenosum. 03/26/17; apparently the patient just had reflux studies not an appointment with Dr. dew. She arrives in clinic today having applied clobetasol for 2-3 weeks. He notes over the last 2-3 days excessive drainage having to change the dressing 3-4 times a day and also expanding erythema. 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 Lillia Carmel at [facility] dermatology locally who kindly saw him at my request. A repeat biopsy apparently has confirmed pyoderma gangrenosum and he started on prednisone 60 mg yesterday. My concern was the degree of erythema medially extending from his left leg wound which was either inflammation from pyoderma or cellulitis. I put him on Augmentin however culture of the wound showed Pseudomonas which is quinolone sensitive. I really don't believe he has cellulitis however in view of everything I will continue and give him a course of Cipro. He is also on doxycycline as an immune modulator for the pyoderma. In addition to his original wound on the left lateral leg with surrounding erythema he has a wound on the right posterior calf which was an original biopsy site done by dermatology. This was felt to represent pathergy from pyoderma gangrenosum 04/16/17; pyoderma gangrenosum. Saw  Dr. Lillia Carmel yesterday. He has been using topical antibiotics to both wound areas his original wound on the left and  the biopsies/pathergy area on the right. There is definitely some improvement in the inflammation around the wound on the right although the patient states he has increasing sensitivity of the wounds. He is on prednisone 60 and doxycycline 1 as prescribed by Dr. Lillia Carmel. He is covering the topical antibiotic with gauze and putting this in his own compression stocks and changing this daily. He states that Dr. Lottie Rater did a culture of the left leg wound yesterday 05/07/17; pyoderma gangrenosum. The patient saw Dr. Lillia Carmel yesterday and has a follow-up with her in one month. He is still using topical antibiotics to both wounds although he can't recall exactly what type. He is still on prednisone 60 mg. Dr. Lillia Carmel stated that the doxycycline could stop if we were in agreement. 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 The right medial leg was induced by biopsy/pathergy. The area on the right is essentially healed. Still on high-dose prednisone using topical antibiotics to the wound 07/09/17; pyoderma gangrenosum with wounds on the left lateral leg. The right medial leg has closed and remains closed. He is still on prednisone 60. He tells me he missed his last dermatology appointment with Dr. Lillia Carmel but will make another appointment. He reports that her blood sugar at a recent screen in [place] was high 200's. He was 180 today. 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 I think he is going to require still much longer prednisone perhaps another 3 months before attempting to taper. In the meantime his wound is a lot better. Smaller. He is cleaning this off daily and applying topical antibiotics. When he was last in the clinic I thought about changing to [facility] and actually put in a couple of calls to dermatology although probably not during their business hours. In any case the  wound looks better smaller I don't think there is any need to change what he is doing 08/06/17-he is here in follow up evaluation for pyoderma left leg ulcer. He continues on oral prednisone. He has been using triple antibiotic ointment. There is surface debris and we will transition to [facility] and have him return in 2 weeks. He has lost 30 pounds since his last appointment with lifestyle modification. He may benefit from topical steroid cream for treatment this can be considered at a later date. 08/22/17 on evaluation today patient appears to actually be doing rather well in regard to his left lateral lower extremity ulcer. He has actually been managed by Dr. Dellia Nims most recently. Patient is currently on oral steroids at this time. This seems to have been of benefit for him. Nonetheless his last visit was actually with Leah on 08/06/17. Currently he is not utilizing any topical steroid creams although this could be of benefit as well. No fevers, chills, nausea, or vomiting noted at this time. 09/05/17 on evaluation today patient appears to be doing better in regard to his left lateral lower extremity ulcer. He has been tolerating the dressing changes without complication. He is using Santyl with good effect. Overall I'm very pleased with how things are standing at this point. Patient likewise is happy that this is doing better. 09/19/17 on evaluation today patient actually appears to be doing rather well in regard to his left lateral lower extremity ulcer. Again this  is secondary to Pyoderma gangrenosum and he seems to be progressing well with the Santyl which is good news. He's not having any significant pain. 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 He has been tolerating the Santyl without complication and in general I feel like he's making good progress. 10/17/17 on evaluation today patient  appears to be doing very well in regard to his left lateral lower surety ulcer. He has been tolerating the dressing changes without complication. There does not appear to be any evidence of infection he's alternating the Santyl and the triple antibiotic ointment every other day this seems to be doing well for him. 11/03/17 on evaluation today patient appears to be doing very well in regard to his left lateral lower extremity ulcer. He is been tolerating the dressing changes without complication which is good news. Fortunately there does not appear to be any evidence of infection which is also great news. Overall is doing excellent they are starting to taper down on the prednisone is down to 40 mg at this point it also started topical clobetasol for him. 11/17/17 on evaluation today patient appears to be doing well in regard to his left lateral lower surety ulcer. He's been tolerating the dressing changes without complication. He does note that he is having no pain, no excessive drainage or discharge, and overall he feels like things are going about how he would expect and hope they would. Overall he seems to have no evidence of infection at this time in my opinion which is good news. 12/04/17-He is seen in follow-up evaluation for right lateral lower extremity ulcer. He has been applying topical steroid cream. Today's measurement show slight increase in size. Over the next 2 weeks we will transition to every other day Santyl and steroid cream. He has been encouraged to monitor for changes and notify clinic with any concerns 12/15/17 on evaluation today patient's left lateral motion the ulcer and fortunately is doing worse again at this point. This just since last week to this week has close to doubled in size according to the patient. I did not seeing last week's I do not have a visual to compare this to in our system was also down so we do not have all the charts and at this point. Nonetheless it does  have me somewhat concerned in regard to the fact that again he was worried enough about it he has contact the dermatology that placed them back on the full strength, 50 mg a day of the prednisone that he was taken previous. He continues to alternate using clobetasol along with Santyl at this point. He is obviously somewhat frustrated. ADEKUNLE, ROHRBACH ([id number]) 12/22/17 on evaluation today patient appears to be doing a little worse compared to last evaluation. Unfortunately the wound is a little deeper and slightly larger than the last week's evaluation. With that being said he has made some progress in regard to the irritation surrounding at this time unfortunately despite that progress that's been made he still has a significant issue going on here. 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 For that reason I discussed with him today the possibility of perform a wound culture to ensure there's nothing overtly infected. 01/06/18 on evaluation today patient's wound is larger and deeper  than previously evaluated. With that being said it did appear that his wound was infected after my last evaluation with him. Subsequently I did end up prescribing a prescription for Bactrim DS which she has been taking and having no complication with. Fortunately there does not appear to be any evidence of infection at this point in time as far as anything spreading, no want to touch, and overall I feel like things are showing signs of improvement. 01/13/18 on evaluation today patient appears to be even a little larger and deeper than last time. There still muscle exposed in the base of the wound. Nonetheless he does appear to be less erythematous I do believe inflammation is calming down also believe the infection looks like it's probably resolved at this time based on what  I'm seeing. No fevers, chills, nausea, or vomiting noted at this time. 01/30/18 on evaluation today patient actually appears to visually look better for the most part. Unfortunately those visually this looks better he does seem to potentially have what may be an abscess in the muscle that has been noted in the central portion of the wound. This is the first time that I have noted what appears to be fluctuance in the central portion of the muscle. With that being said I'm somewhat more concerned about the fact that this might indicate an abscess formation at this location. I do believe that an ultrasound would be appropriate. This is likely something we need to try to do as soon as possible. He has been switch to mupirocin ointment and he is no longer using the steroid ointment as prescribed by dermatology he sees them again next week he's been decreased from 60 to 40 mg of prednisone. 03/09/18 on evaluation today patient actually appears to be doing a little better compared to last time I saw him. There's not as much erythema surrounding the wound itself. He I did review his most recent infectious disease note which was dated 02/24/18. He saw Dr. Michel Bickers in [place]. 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 Patient is now off of antibiotics and they are continually observing this. There seems to be no change in the past two weeks in my pinion based on what the patient says and what I see today compared to what Dr. Megan Salon likely saw two weeks ago. No fevers, chills, nausea, or vomiting noted at this time. 03/23/18 on evaluation today patient's wound actually appears to be showing signs of improvement which is good news. He is currently still on the Dapsone. He is also working on tapering the prednisone to get off of this and Dr. Lottie Rater is working with him in this regard. Nonetheless overall I feel like  the wound is doing well it does appear based on the infectious disease note that I reviewed from Dr. 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 I did also review the results of his ultrasound of left lower extremity which revealed there was a dentist tissue in the base of the wound without an abscess noted. 04/06/18 on evaluation today the patient's left lateral lower extremity ulcer actually appears to be doing fairly well which is excellent news. There does not appear to be any evidence of infection at this time which is also great news. Overall he  still does have a significantly large ulceration although little by little he seems to be making progress. He is down to 10 mg a day of the prednisone. 04/20/18 on evaluation today patient actually appears to be doing excellent at this time in regard to his left lower extremity ulcer. He's making signs of good progress unfortunately this is taking much longer than we would really like to see but nonetheless he is making progress. Fortunately there does not appear to be any evidence of infection at this time. No fevers, chills, nausea, or vomiting noted at this time. The patient has not been using the Santyl due to the cost he hadn't got in this field yet. He's mainly been using the antibiotic ointment topically. Subsequently he also tells me that he really has not been scrubbing in the shower I think this would be helpful again as I told him it doesn't have to be anything too aggressive to even make it believe just enough to keep it free of some of the loose slough and biofilm on the wound surface. 05/11/18 on evaluation today patient's wound appears to be making slow but sure progress in regard to the left lateral lower extremity ulcer. He is been tolerating the dressing changes without complication. Fortunately there does not appear to be any  evidence of infection at this time. He is still just using triple antibiotic ointment along with clobetasol occasionally over the area. He never got the Santyl and really does not seem to intend to in my pinion. 06/01/18 on evaluation today patient appears to be doing a little better in regard to his left lateral lower extremity ulcer. He states that overall he does not feel like he is doing as well with the Dapsone as he did with the prednisone. Nonetheless he sees his dermatologist later today and is gonna talk to them about the possibility of going back on the prednisone. Overall again I believe that the wound would be better if you would utilize Santyl but he really does not seem to be interested in going back to the [place] at this point. He has been using triple antibiotic ointment. 06/15/18 on evaluation today patient's wound actually appears to be doing about the same at this point. Fortunately there is no signs of infection at this time. He has made slight improvements although he continues to not really want to clean the wound bed at this point. He states that he just doesn't mess with it he doesn't want to cause any problems with everything else he has going on. He has been on medication, antibiotics as prescribed by his dermatologist, for a staff infection of his lower extremities which is really drying out now and looking much better he tells me. Fortunately there is no sign of overall infection. 06/29/18 on evaluation today patient appears to be doing well in regard to his left lateral lower surety ulcer all things considering. Fortunately his staff infection seems to be greatly improved compared to previous. He has no signs of infection and this is drying up quite nicely. He is still the doxycycline for this is no longer on cental, Dapsone, or any of the other medications. His dermatologist has recommended possibility of an infusion but right now he does not want to proceed with  that. 07/13/18 on evaluation today patient appears to be doing about the same in regard to his left lateral lower surety ulcer. Fortunately there's no signs of infection at this time which is great news. Unfortunately he still  builds up a significant amount of Slough/biofilm of the surface of the wound he still is not really cleaning this as he should be appropriately. Again I'm able to easily with saline and gauze remove the majority of this on the surface which if you would do this at home would likely be a dramatic improvement for him as far as getting the area to improve. Nonetheless overall I still feel like he is making progress is just very slow. I think Santyl will be of benefit for him as well. Still he has not gotten this as of this point. 07/27/18 on evaluation today patient actually appears to be doing little worse in regards of the erythema around the periwound region of the wound he also tells me that he's been having more drainage currently compared to what he was experiencing last time I saw him. He states not quite as bad as what he had because this was infected previously but nonetheless is still appears to be doing poorly. Fortunately there is no evidence of systemic infection at this point. The patient tells me that he is not going to be able to afford the Santyl. He is still waiting to hear about the infusion therapy with his dermatologist. Apparently she wants an updated colonoscopy first. FUTURE, YELDELL ([id number]) 08/10/18 on evaluation today patient appears to be doing better in regard to his left lateral lower extremity ulcer. Fortunately he is showing signs of improvement in this regard he's actually been approved for Remicade infusion's as well although this has not been scheduled as of yet. Fortunately there's no signs of active infection at this time in regard to the wound although he is having some issues with infection of the right lower extremity is been seen as  dermatologist for this. Fortunately they are definitely still working with him trying to keep things under control. 09/07/18 on evaluation today patient is actually doing rather well in regard to his left lateral lower extremity ulcer. He notes these actually having some hair grow back on his extremity which is something he has not seen in years. He also tells me that the pain is really not giving them any trouble at this time which is also good news overall she is very pleased with the progress he's using a combination of the mupirocin along with the probate is all mixed. 09/21/18 on evaluation today patient actually appears to be doing fairly well all things considered in regard to his looks from the ulcer. He's been tolerating the dressing changes without complication. Fortunately there's no signs of active infection at this time which is good news he is still on all antibiotics or prevention of the staff infection. He has been on prednisone for time although he states it is gonna contact his dermatologist and see if she put them on a short course due to some irritation that he has going on currently. Fortunately there's no evidence of any overall worsening this is going very slow I think cental would be something that would be helpful for him although he states that $50 for tube is quite expensive. He therefore is not willing to get that at this point. 10/06/18 on evaluation today patient actually appears to be doing decently well in regard to his left lateral leg ulcer. He's been tolerating the dressing changes without complication. Fortunately there's no signs of active infection at this time. Overall I'm actually rather pleased with the progress he's making although it's slow he doesn't show any signs of infection and  he does seem to be making some improvement. I do believe that he may need a switch up and dressings to try to help this to heal more appropriately and quickly. 10/19/18 on evaluation  today patient actually appears to be doing better in regard to his left lateral lower extremity ulcer. This is shown signs of having much less Slough buildup at this point due to the fact he has been using the [employer]. Obviously this is very good news. The overall size of the wound is not dramatically smaller but again the appearance is. 11/02/18 on evaluation today patient actually appears to be doing quite well in regard to his lower [place] ulcer. A lot of the skin around the ulcer is actually somewhat irritating at this point this seems to be more due to the dressing causing irritation from the adhesive that anything else. Fortunately there is no signs of active infection at this time. 11/24/18 on evaluation today patient appears to be doing a little worse in regard to his overall appearance of his lower extremity ulcer. There's more erythema and warmth around the wound unfortunately. He is currently on doxycycline which he has been on for some time. With that being said I'm not sure that seems to be helping with what appears to possibly be an acute cellulitis with regard to his left lower extremity ulcer. No fevers, chills, nausea, or vomiting noted at this time. 12/08/18 on evaluation today patient's wounds actually appears to be doing significantly better compared to his last evaluation. He has been using Santyl along with alternating tripling about appointment as well as the steroid cream seems to be doing quite well and the wound is showing signs of improvement which is excellent news. Fortunately there's no evidence of infection and in fact his culture came back negative with only normal skin flora noted. 12/21/2018 upon evaluation today patient actually appears to be doing excellent with regard to his ulcer. This is actually the best that I have seen it since have been helping to take care of him. It is both smaller as well as less slough noted on the surface of the wound and seems to be  showing signs of good improvement with new skin growing from the edges. He has been using just the triamcinolone he does wonder if he can get a refill of that ointment today. 01/04/2019 upon evaluation today patient actually appears to be doing well with regard to his left lateral lower extremity ulcer. With that being said it does not appear to be that he is doing quite as well as last time as far as progression is concerned. There does not appear to be any signs of infection or significant irritation which is good news. With that being said I do believe that he may benefit from switching to a collagen based dressing based on how clean The wound appears. 01/18/2019 on evaluation today patient actually appears to be doing well with regard to his wound on the left lower extremity. He is not made a lot of progress compared to where we were previous but nonetheless does seem to be doing okay at this time which is good news. There is no signs of active infection which is also good news. My only concern currently is I do wish we can get him into utilizing the collagen dressing his insurance would not pay for the supplies that we ordered although it appears that he may be able to order this through his supply company that he typically utilizes.  This is [employer]. Nonetheless he did try to order it during the office visit today and it appears this did go through. We will see if he can get that it is a different brand but nonetheless he has collagen and I do think will be beneficial. 02/01/2019 on evaluation today patient actually appears to be doing a little worse today in regard to the overall size of his wounds. Fortunately there is no signs of active infection at this time. That is visually. Nonetheless when this is happened before it was due to infection. For that reason were somewhat concerned about that this time as well. 02/08/2019 on evaluation today patient unfortunately appears to be doing slightly  worse with regard to his wound upon evaluation today. Is measuring a little deeper and a little larger unfortunately. I am not really sure exactly what is causing this to enlarge he actually did see his dermatologist she is going to see about initiating Humira for him. Subsequently she also did do steroid injections into the wound itself in the periphery. Nonetheless still nonetheless he seems to be getting a little bit larger he is gone back to just using the steroid cream topically which I think is appropriate. I would say hold off on the collagen for the time being is definitely a good thing to do. Based on the culture results which we finally did get the final result back regarding it shows staph as the bacteria noted again that can be a normal skin bacteria based on the fact however he is having increased drainage and worsening of the wound measurement wise I would go ahead and place him on an antibiotic today I do believe for this. 02/15/2019 on evaluation today patient actually appears to be doing somewhat better in regard to his ulcer. There is no signs of worsening at this time I did review his culture results which showed evidence of Staphylococcus aureus but not MRSA. Again this could just be more related to the normal skin bacteria although he states the drainage has slowed down quite a bit he may have had a mild infection not just colonization. And was much smaller and then since around10/04/2019 on evaluation today patient appears to be doing unfortunately worse as far as the size of the wound. I really feel like that this is steadily getting larger again it had been doing excellent right at the beginning of September we have seen a steady increase in the area of the wound it is almost 2-1/2 times the size it was on September 1. Obviously this is a bad trend this is not wanting to see. For that reason we went back to using just the topical triamcinolone cream which does seem to help with  inflammation. I checked him for bacteria by way of culture and nothing showed positive there. I am considering giving him a short course of a tapering steroid Dandrea Widdowson, Yunus ([id number]) today to see if that is can be beneficial for him. The patient is in agreement with giving that a try. 03/08/2019 on evaluation today patient appears to be doing very well in comparison to last evaluation with regard to his lower extremity ulcer. This is showing signs of less inflammation and actually measuring slightly smaller compared to last time every other week over the past month and a half he has been measuring larger larger larger. Nonetheless I do believe that the issue has been inflammation the prednisone does seem to have been beneficial for him which is good news.  No fevers, chills, nausea, vomiting, or diarrhea. 03/22/2019 on evaluation today patient appears to be doing about the same with regard to his leg ulcer. He has been tolerating the dressing changes without complication. With that being said the wound seems to be mostly arrested at its current size but really is not making any progress except for when we prescribed the prednisone. He did show some signs of dropping as far as the overall size of the wound during that interval week. Nonetheless this is something he is not on long-term at this point and unfortunately I think he is getting need either this or else the Humira which his dermatologist has discussed try to get approval for. With that being said he will be seeing his dermatologist on the 11th of this month that is November. 04/19/2019 on evaluation today patient appears to be doing really about the same the wound is measuring slightly larger compared to last time I saw him. He has not been into the office since November 2 due to the fact that he unfortunately had Covid as that his entire family. He tells me that it was rough but they did pull-through and he seems to be doing much  better. Fortunately there is no signs of active infection at this time. No fevers, chills, nausea, vomiting, or diarrhea. 05/10/2019 on evaluation today patient unfortunately appears to be doing significantly worse as compared to last time I saw him. He does tell me that he has had his first dose of Humira and actually is scheduled to get the next one in the upcoming week. With that being said he tells me also that in the past several days he has been having a lot of issues with green drainage she showed me a picture this is more blue-green in color. He is also been having issues with increased sloughy buildup and the wound does appear to be larger today. Obviously this is not the direction that we want everything to take based on the starting of his Humira. Nonetheless I think this is definitely a result of likely infection and to be honest I think this is probably Pseudomonas causing the infection based on what I am seeing. 05/24/2019 on evaluation today patient unfortunately appears to be doing significantly worse compared to his prior evaluation with me 2 weeks ago. I did review his culture results which showed that he does have Staph aureus as well as Pseudomonas noted on the culture. Nonetheless the Levaquin that I prescribed for him does not appear to have been appropriate and in fact he tells me he is no longer experiencing the green drainage and discharge that he had at the last visit. Fortunately there is no signs of active infection at this time which is good news although the wound has significantly worsened it in fact is much deeper than it was previous. We have been utilizing up to this point triamcinolone ointment as the prescription topical of choice but at this time I really feel like that the wound is getting need to be packed in order to appropriately manage this due to the deeper nature of the wound. Therefore something along the lines of an alginate dressing may be more  appropriate. 05/31/2019 upon inspection today patient's wound actually showed signs of doing poorly at this point. Unfortunately he just does not seem to be making any good progress despite what we have tried. He actually did go ahead and pick up the Cipro and start taking that as he was noticing  more green drainage he had previously completed the Levaquin that I prescribed for him as well. Nonetheless he missed his appointment for the seventh last week on Wednesday with the wound care center and [name] where his dermatologist referred him. Obviously I do think a second opinion would be helpful at this point especially in light of the fact that the patient seems to be doing so poorly despite the fact that we have tried everything that I really know how at this point. The only thing that ever seems to have helped him in the past is when he was on high doses of continual steroids that did seem to make a difference for him. 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 06/07/2019 upon evaluation today patient unfortunately appears to be doing worse yet again with regard to his wound. In fact I am starting to question whether or not he may have a fluid pocket in the muscle at this point based on the bulging and the soft appearance to the central portion of the muscle area. There is not anything draining from the muscle itself at this time which is good news but nonetheless the wound is expanding. I am not really seeing any results of the Humira as far as overall wound progression based on what I am seeing at this point. The patient has been referred for second opinion with regard to his wound to the [facility] wound care center by his dermatologist which I definitely am not in opposition to. Unfortunately we tried multiple dressings in the past  including collagen, alginate, and at one point even Hydrofera Blue. With that being said he is never really used it for any significant amount of time due to the fact that he often complains of pain associated with these dressings and then will go back to either using the Santyl which she has done intermittently or more frequently the triamcinolone. He is also using his own compression stockings. We have wrapped him in the past but again that was something else that he really was not a big fan of. 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 He seemed to do much better when he was on the steroids although honestly I understand that the steroids are not necessarily the best medication to be on long-term obviously 06/14/2019 on evaluation today patient appears to be doing actually a little bit better with regard to the overall appearance with his leg. Unfortunately he does continue to have issues with what appears to be some fluid underneath the muscle although he did see the wound specialty center at [facility] last week their main goals were to see about infusion therapy in place of the Humira as they feel like that is not quite strong enough. They also recommended that we continue with the treatment otherwise as we are they felt like that was appropriate and they are okay with him continuing to follow-up here with [place] in that regard. With that  being said they are also sending him to the vein specialist there to see about vein stripping and if that would be of benefit for him. Subsequently they also did not really address whether or not an ultrasound of the muscle area to see if there is  anything that needs to be addressed here would be appropriate or not. For that reason I discussed this with him last week I think we may proceed down that road at this point. 06/21/2019 upon evaluation today patient's wound actually appears to be doing slightly better compared to previous evaluations. I do believe that he has made a difference with regard to the progression here with the use of oral steroids. Again in the past has been the only thing that is really calm things down. He does tell me that from [facility] is gotten a good news from there that there are no further vein stripping that is necessary at this point. I do not have that available for review today although the patient did relay this to me. He also did obtain and have the ultrasound of the wound completed which I did sign off on today. It does appear that there is no fluid collection under the muscle this is likely then just edematous tissue in general. That is also good news. Overall I still believe the inflammation is the main issue here. He did inquire about the possibility of a wound VAC again with the muscle protruding like it is I am not really sure whether the wound VAC is necessarily ideal or not. That is something we will have to consider although I do believe he may need compression wrapping to try to help with edema control which could potentially be of benefit. 06/28/2019 on evaluation today patient appears to be doing slightly better measurement wise although this is not terribly smaller he least seems to be trending towards that direction. With that being said he still seems to have purulent drainage noted in the wound bed at this time. He has been on Levaquin followed by Cipro over the past month. Unfortunately he still seems to have some issues with active infection at this time. I did perform a culture last week in order to evaluate and see if indeed there was still anything going on. Subsequently the culture did come  back Sandquist, Yu ([id number]) showing Pseudomonas which is consistent with the drainage has been having which is blue-green in color. He also has had an odor that again was somewhat consistent with Pseudomonas as well. Long story short it appears that the culture showed an intermediate finding with regard to how well the Cipro will work for the Pseudomonas infection. Subsequently being that he does not seem to be clearing up and at best what we are doing is just keeping this at [place] I think he may need to see infectious disease to discuss IV antibiotic options. 07/05/2019 upon evaluation today patient appears to be doing okay in regard to his leg ulcer. He has been tolerating the dressing changes at this point without complication. Fortunately there is no signs of active infection at this time which is good news. No fevers, chills, nausea, vomiting, or diarrhea. With that being said he does have an appointment with infectious disease tomorrow and his primary care on Wednesday. Again the reason for the infectious disease referral was due to the fact that he did not seem to be fully resolving with the use of oral antibiotics and therefore we were thinking that IV antibiotic therapy  may be necessary secondary to the fact that there was an intermediate finding for how effective the Cipro may be. Nonetheless again he has been having a lot of purulent and even green drainage. Fortunately right now that seems to have calmed down over the past week with the reinitiation of the oral antibiotic. Nonetheless we will see what Dr. Megan Salon has to say. 07/12/2019 upon evaluation today patient appears to be doing about the same at this point in regard to his left lower extremity ulcer. Fortunately there is no signs of active infection at this time which is good news I do believe the Levaquin has been beneficial I did review Dr. Hale Bogus note and to be honest I agree that the patient's leg does appear to be doing  better currently. What we found in the past as he does not seem to really completely resolve he will stop the antibiotic and then subsequently things will revert back to having issues with blue-green drainage, increased pain, and overall worsening in general. Obviously that is the reason I sent him back to infectious disease. 07/19/2019 upon evaluation today patient appears to be doing roughly the same in size there is really no dramatic improvement. He has started back on the Levaquin at this point and though he seems to be doing okay he did still have a lot of blue/green drainage noted on evaluation today unfortunately. I think that this is still indicative more likely of a Pseudomonas infection as previously noted and again he does see Dr. Megan Salon in just a couple of days. I do not know that were really able to effectively clear this with just oral antibiotics alone based on what I am seeing currently. Nonetheless we are still continue to try to manage as best we can with regard to the patient and his wound. I do think the wrap was helpful in decreasing the edema which is excellent news. No fevers, chills, nausea, vomiting, or diarrhea. 07/26/2019 upon evaluation today patient appears to be doing slightly better with regard to the overall appearance of the muscle there is no dark discoloration centrally. Fortunately there is no signs of active infection at this time. No fevers, chills, nausea, vomiting, or diarrhea. Patient's wound bed currently the patient did have an appointment with Dr. Megan Salon at infectious disease last week. With that being said Dr. Megan Salon the patient states was still somewhat hesitant about put him on any IV antibiotics he wanted [place] to repeat cultures today and then see where things go going forward. He does look like Dr. Megan Salon because of some improvement the patient did have with the Levaquin wanted [place] to see about repeating cultures. If it indeed grows the Pseudomonas  again then he recommended a possibility of considering a PICC line placement and IV antibiotic therapy. He plans to see the patient back in 1 to 2 weeks. 08/02/2019 upon evaluation today patient appears to be doing poorly with regard to his left lower extremity. We did get the results of his culture back it shows that he is still showing evidence of Pseudomonas which is consistent with the purulent/blue-green drainage that he has currently. Subsequently the culture also shows that he now is showing resistance to the oral fluoroquinolones which is unfortunate as that was really the only thing to treat the infection prior. I do believe that he is looking like this is going require IV antibiotic therapy to get this under control. Fortunately there is no signs of systemic infection at this time which is  good news. The patient does see Dr. Megan Salon tomorrow. 08/09/2019 upon evaluation today patient appears to be doing better with regard to his left lower extremity ulcer in regard to the overall appearance. He is currently on IV antibiotic therapy. As ordered by Dr. Megan Salon. Currently the patient is on ceftazidime which she is going to take for the next 2 weeks and then follow-up for 4 to 5-week appointment with Dr. Megan Salon. The patient started this this past Friday symptoms have not for a total of 3 days currently in full. 08/16/2019 upon evaluation today patient's wound actually does show muscle in the base of the wound but in general does appear to be much better as far as the overall evidence of infection is concerned. In fact I feel like this is for the most part cleared up he still on the IV antibiotics he has not completed the full course yet but I think he is doing much better which is excellent news. 08/23/2019 upon evaluation today patient appears to be doing about the same with regard to his wound at this point. He tells me that he still has pain unfortunately. Fortunately there is no evidence of  systemic infection at this time which is great news. There is significant muscle protrusion. 09/13/19 upon evaluation today patient appears to be doing about the same in regard to his leg unfortunately. He still has a lot of drainage coming from the ulceration there is still muscle exposed. With that being said the patient's last wound culture still showed an intermediate finding with regard to the Pseudomonas he still having the bluish/green drainage as well. Overall I do not know that the wound has completely cleared of infection at this point. Fortunately there is no signs of active infection systemically at this point which is good news. 09/20/2019 upon evaluation today patient's wound actually appears to be doing about the same based on what I am seeing currently. I do not see any signs of systemic infection he still does have evidence of some local infection and drainage. He did see Dr. Megan Salon last week and Dr. Megan Salon states that he probably does need a different IV antibiotic although he does not want to put him on this until the patient begins the Remicade infusion which is actually scheduled for about 10 days out from today on 13 May. Following that time Dr. Megan Salon is good to see him back and then will evaluate the feasibility of starting him on the IV antibiotic therapy once again at that point. I do not disagree with this plan I do believe as Dr. Megan Salon stated in his note that I reviewed today that the patient's issue is multifactorial with the pyoderma being 1 aspect of this that were hoping the Remicade will be helpful for her. In the meantime I think the gentamicin is, helping to keep things under decent okay control in regard to the ulcer. 09/27/2019 upon evaluation today patient appears to be doing about the same with regard to his wound still there is a lot of muscle exposure though he does have some hyper granulation tissue noted around the edge and actually some granulation  tissue starting to form over the muscle which is actually good news. Fortunately there is no evidence of active infection which is also good news. His pain is less at this point. 5/21; this is a patient I have not seen in a long time. 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 He has a large wound  on the left lateral leg with protruding muscle. He comes in the clinic today showing the same area on his left medial ankle. He says there is been a spot there for some time although we have not previously defined this. Today he has a clearly defined area with slight amount of skin breakdown surrounded by raised areas with a purplish hue in color. This is not painful he says it is irritated. 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 He still has muscle protruding from the lateral portion of his left leg but fortunately the new area that they were concerned about at his last visit does not appear to have opened at this point. He is currently on Remicade infusions and seems to be doing better in my opinion in fact the wound itself seems to be overall much better. The purplish discoloration that he did have seems to have resolved and I think that is a good sign that hopefully the Remicade is doing its job. He does Beckom, Elmer ([id number]) have some biofilm noted over the surface of the wound. 11/01/2019 on evaluation today patient's wound actually appears to be doing excellent at this time. Fortunately there is no evidence of active infection and overall I feel like he is making great progress. The Remicade seems to be due excellent job in my opinion. 11/08/19 evaluation today vision actually appears to be doing quite well with regard to his weight ulcer. He's been tolerating dressing changes without complication. Fortunately there is no evidence of infection. No  fevers, chills, nausea, or vomiting noted at this time. Overall states that is having more itching than pain which is actually a good sign in my opinion. 12/13/2019 upon evaluation today patient appears to be doing well today with regard to his wound. He has been tolerating the dressing changes without complication. Fortunately there is no sign of active infection at this time. No fevers, chills, nausea, vomiting, or diarrhea. Overall I feel like the infusion therapy has been very beneficial for him. 01/06/2020 on evaluation today patient appears to be doing well with regard to his wound. This is measuring smaller and actually looks to be doing better. Fortunately there is no signs of active infection at this point. No fevers, chills, nausea, vomiting, or diarrhea. With that being said he does still have the blue-green drainage but this does not seem to be causing any significant issues currently. He has been using the gentamicin that does seem to be keeping things under decent control at this point. He goes later this morning for his next infusion therapy for the pyoderma which seems to also be very beneficial. 02/07/2020 on evaluation today patient appears to be doing about the same in regard to his wounds currently. Fortunately there is no signs of active infection systemically he does still have evidence of local infection still using gentamicin. He also is showing some signs of improvement albeit slowly I do feel like we are making some progress here. 02/21/2020 upon evaluation today patient appears to be making some signs of improvement the wound is measuring a little bit smaller which is great news and overall I am very pleased with where he stands currently. He is going to be having infusion therapy treatment on the 15th of this month. Fortunately there is no signs of active infection at this time. 03/13/2020 I do believe patient's wound is actually showing some signs of improvement here which  is  great news. He has continue with the infusion therapy through rheumatology/dermatology at [facility]. That does seem to be beneficial. I still think he gets as much benefit from this as he did from the prednisone initially but nonetheless obviously this is less harsh on his body that the prednisone as far as they are concerned. 03/31/2020 on evaluation today patient's wound actually showing signs of some pretty good improvement in regard to the overall appearance of the wound bed. There is still muscle exposed though he does have some epithelial growth around the edges of the wound. Fortunately there is no signs of active infection at this time. No fevers, chills, nausea, vomiting, or diarrhea. 04/24/2020 upon evaluation today patient appears to be doing about the same in regard to his leg ulcer. He has been tolerating the dressing changes without complication. Fortunately there is no signs of active infection at this time. No fevers, chills, nausea, vomiting, or diarrhea. With that being said he still has a lot of irritation from the bandaging around the edges of the wound. We did discuss today the possibility of a referral to plastic surgery. 05/22/2020 on evaluation today patient appears to be doing well with regard to his wounds all things considered. He has not been able to get the Chantix apparently there is a recall nurse that I was unaware of put out by [employer] involuntarily. Nonetheless for now I am and I have to do some research into what may be the best option for him to help with quitting in regard to smoking and we discussed that today. 06/26/2020 upon evaluation today patient appears to be doing well with regard to his wound from the standpoint of infection I do not see any signs of infection at this point. With that being said unfortunately he is still continuing to have issues with muscle exposure and again he is not having a whole lot of new skin growth unfortunately. There does not appear to be  any signs of active infection at this time. No fevers, chills, nausea, vomiting, or diarrhea. 07/10/2020 upon evaluation today patient appears to be doing a little bit more poorly currently compared to where he was previous. I am concerned currently about an active infection that may be getting worse especially in light of the increased size and tenderness of the wound bed. No fevers, chills, nausea, vomiting, or diarrhea. 07/24/2020 upon evaluation today patient appears to be doing poorly in regard to his leg ulcer. He has been tolerating the dressing changes without complication but unfortunately is having a lot of discomfort. 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 Subsequently I think he is going require possibly IV antibiotics to get this under control. I am very concerned about the severity of his infection and the amount of discomfort he is having. 07/31/2020 upon evaluation today patient appears to be doing about the same in regard to his leg wound. He did see Dr. Megan Salon and Dr. Megan Salon is actually going to start him on IV antibiotics. He goes for the PICC line tomorrow. With that being said there do not have that run for 2 weeks and then see how things are doing and depending on how he is progressing they may extend that a little longer. Nonetheless I am glad this is getting ready to be in place and definitely feel it may help the patient. In the meantime is been using mainly triamcinolone to the wound bed has an anti-inflammatory. 08/07/2020  on evaluation today patient appears to be doing well with regard to his wound compared even last week. In the interim he has gotten the PICC line placed and overall this seems to be doing excellent. There does not appear to be any evidence of infection which is great news systemically although locally of course has had the infection this appears to be improving with the use of the  antibiotics. 08/14/2020 upon evaluation today patient's wound actually showing signs of excellent improvement. Overall the irritation has significantly improved the drainage is back down to more of a normal level and his pain is really pretty much nonexistent compared to what it was. Obviously I think that this is significantly improved secondary to the IV antibiotic therapy which has made all the difference in the world. Again he had a resistant form of Pseudomonas for which oral antibiotics just was not cutting it. Nonetheless I do think that still we need to consider the possibility of a surgical closure for this wound is been open so long and to be honest with muscle exposed I think this can be very hard to get this to close outside of this although definitely were still working to try to do what we can in that regard. 08/21/2020 upon evaluation today patient appears to be doing very well with regard to his wounds on the left lateral lower extremity/calf area. Fortunately there does not appear to be signs of active infection which is great news and overall very pleased with where things stand today. He is actually wrapping up his treatment with IV antibiotics tomorrow. After that we will see where things go from there. 08/28/2020 upon evaluation today patient appears to be doing decently well with regard to his leg ulcer. There does not appear to be any signs of Gazda, Kealii ([id number]) active infection which is great news and overall very pleased with where things stand today. No fevers, chills, nausea, vomiting, or diarrhea. 09/18/2020 upon evaluation today patient appears to be doing well with regard to his infection which I feel like is better. Unfortunately he is not doing as well with regard to the overall size of the wound which is not nearly as good at this point. I feel like that he may be having an issue here with the pyoderma being somewhat out of control. I think that he may benefit from  potentially going back and talking to the dermatologist about what to do from the pyoderma standpoint. I am not certain if the infusions are helping nearly as much is what the prednisone did in the past. 10/02/2020 upon evaluation today patient appears to be doing well with regard to his leg ulcer. He did go to the Psychiatric nurse. Unfortunately they feel like there is a 10% chance that most that he would be able to heal and that the skin graft would take. Obviously this has led him to not be able to go down that path as far as treatment is concerned. Nonetheless he does seem to be doing a little bit better with the prednisone that I gave him last time. I think that he may need to discuss with dermatology the possibility of long-term prednisone as that seems to be what is most helpful for him to be perfectly honest. I am not sure the Remicade is really doing the job. 10/17/2020 upon evaluation today patient appears to be doing a little better in regard to his wound. In fact the case has been since we did the prednisone on  May 2 for him that we have noticed a little bit of improvement each time we have seen a size wise as well as appearance wise as well as pain wise. I think the prednisone has had a greater effect then the infusion therapy has to be perfectly honest. With that being said the patient also feels significantly better compared to what he was previous. All of this is good news but nonetheless I am still concerned about the fact that again we are really not set up to long-term manage him as far as prednisone is concerned. Obviously there are things that you need to be watched I completely understand the risk of prednisone usage as well. 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 With all that being said I do believe that we can give him another round of the prednisone which she is requesting today because of the improvement that he seen since we did  that first round. 10/30/2020 upon evaluation today patient's wound actually is showing signs of doing quite well. There does not appear to be any evidence of infection which is great news and overall very pleased with where things stand today. No fevers, chills, nausea, vomiting, or diarrhea. He tells me that the prednisone still has seem to have helped he wonders if we can extend that for just a little bit longer. He did not have the appointment with a dermatologist although he did have an infusion appointment last Friday. That was at [facility]. With that being said he tells me he could not do both that as well as the appointment with the physician on the same day therefore that is can have to be rescheduled. I really want to see if there is anything they feel like that could be done differently to try to help this out as I am not really certain that the infusions are helping significantly here. 11/13/2020 upon evaluation today patient unfortunately appears to be doing somewhat poorly in regard to his wound I feel like this is actually worsening from the standpoint of the pyoderma spreading. I still feel like that he may need something different as far as trying to manage this going forward. Again we did the prednisone unfortunately his blood sugars are not doing so well because of this. Nonetheless I believe that the patient likely needs to try topical steroid. We have done triamcinolone for a while I think going with something stronger such as clobetasol could be beneficial again this is not something I do lightly I discussed this with the patient that again this does not normally put underneath an occlusive dressing. Nonetheless I think a thin film as such could help with some of the stronger anti-inflammatory effects. We discussed this today. He would like to try to give this a trial for the next couple weeks. I definitely think that is something that we can do. Evaluate7/03/2021 and today patient's  wound bed actually showed signs of doing really about the same. There was a little expansion of the size of the wound and that leading edge that we done looking out although the clobetasol does seem to have slowed this down a bit in my opinion. There is just 1 small area that still seems to be progressing based on what I see. Nonetheless I am concerned about the fact this does not seem to be improving if anything seems to be doing a little bit worse. I do not know that the infusions  are really helping him much as next infusion is August 5 his appointment with dermatology is July 25. Either way I really think that we need to have a conversation potentially about this and I am actually going to see if I can talk with Dr. Lillia Carmel in order to see where things stand as well. 12/11/2020 upon evaluation today patient appears to be doing worse in regard to his leg ulcer. Unfortunately I just do not think this is making the progress that I would like to see at this point. Honestly he does have an appointment with dermatology and this is in 2 days. I am wondering what they may have to offer to help with this. Right now what I am seeing is that he is continuing to show signs of worsening little by little. Obviously that is not great at all. Is the exact opposite of what we are looking for. 12/18/2020 upon evaluation today patient appears to be doing a little better in regard to his wound. The dermatologist actually did do some steroid injections into the wound which does seem to have been beneficial in my opinion. That was on the 25th already this looks a little better to me than last time I saw him. With that being said we did do a culture and this did show that he has Staph aureus noted in abundance in the wound. With that being said I do think that getting him on an oral antibiotic would be appropriate as well. Also think we can compression wrap and this will make a difference as well. 12/28/2020 upon  evaluation today patient's wound is actually showing signs of doing much better. I do believe the compression wrap is helping he has a lot of drainage but to be honest I think that the compression is helping to some degree in this regard as well as not draining through which is also good news. No fevers, chills, nausea, vomiting, or diarrhea. 01/04/2021 upon evaluation today patient appears to be doing well with regard to his wound. Overall things seem to be doing quite well. He did have a little bit of reaction to the CarboFlex Sorbact he will be using that any longer. With that being said he is controlled as far as the drainage is concerned overall and seems to be doing quite well. I do not see any signs of active infection at this time which is great news. No fevers, chills, nausea, vomiting, or diarrhea. 01/11/2021 upon evaluation today patient appears to be doing well with regard to his wounds. He has been tolerating the dressing changes without complication. Fortunately there does not appear to be any signs of active infection at this time which is great news. Overall I am extremely pleased with where we stand currently. No fevers, chills, nausea, vomiting, or diarrhea. Where using clobetasol in the wound bed he has a lot of new skin growth which is awesome as well. 01/18/2021 upon evaluation today patient appears to be doing very well in regard to his leg ulcer. He has been tolerating the dressing changes without complication. Fortunately there does not appear to be any signs of active infection which is great news. In general I think that he is making excellent progress 01/25/2021 upon evaluation today patient appears to be doing well with regard to his wound on the leg. I am actually extremely pleased with where things stand today. There does not appear to be any signs of active infection which is great news and overall I think that  we are definitely headed in the appropriate direction based on  what I am seeing currently. There does not appear to be any signs of active infection also excellent news. 02/06/2021 upon evaluation today patient appears to be doing well with regard to his wound. Overall visually this is showing signs of significant Villeda, Aaron ([id number]) improvement which is great news. I do not see any signs of active infection systemically which is great even locally I do not think that we are seeing any major complications here. We did do fluorescence imaging with the [employer] DX today. The patient does have some odor and drainage noted and again this is something that I think would benefit him to probably come more frequently for nurse visits. 02/19/2021 upon evaluation today patient actually appears to be doing quite well in regard to his wound. He has been tolerating the dressing changes without complication and overall I think that this is making excellent progress. I do not see any evidence of active infection at this point which is great news as well. No fevers, chills, nausea, vomiting, or diarrhea. 10/10; wound is made nice progress healthy granulation with a nice rim of epithelialization which seems to be expanding even from last week he has a deeper area in the inferior part of the more distal part of the wound with not quite as healthy as surface. This area will need to be followed. Using clobetasol and Hydrofera Blue 03/05/2021 upon evaluation today patient appears to be doing very well in regard to his leg ulcer. He has been tolerating dressing changes without complication. Fortunately there does not appear to be any signs of active infection which is great news and overall I am extremely pleased with where we stand currently. 03/12/2021 upon evaluation today patient appears to be doing well with regard to his wound in fact this is extremely extremely good based on what we are seeing today there does not appear to be any signs of active infection and overall I  think that he is doing awesome from the standpoint of healing in general. I am extremely pleased with how things seem to be progressing with regard to this pyoderma. Clobetasol has done wonders for him. I think the compression wrapping has also been of great benefit. 03/19/2021 upon evaluation today patient appears to be doing well with regard to his wound. He is tolerating the dressing changes without complication. In fact I feel like that he is actually making excellent progress at this point based on what I am seeing. No fevers, chills, nausea, vomiting, or diarrhea. 03/26/2021 upon evaluation today patient appears to be doing well with regard to his wound. This again is measuring smaller and looking better. Again the progress is slow but nonetheless continual with what we have been seeing. I do believe that the current plan is doing awesome for him. 04/09/2021 upon evaluation today patient appears to be doing well with regard to his leg ulcer. This is showing signs of excellent improvement the muscle is completely closed over and there does not appear to be any evidence of inflammation at this point his drainage is significantly improved. Overall I think that he would be a good candidate for looking into a skin substitute at this point as well. We will get a look into some approvals in that regard. Potentially TheraSkin as well as Apligraf could both be considered just depending on insurance coverage. 04/16/2021 upon evaluation today patient appears to be doing well at this point. He has been approved  for the Apligraf which we could definitely order although I would like to try to get the TheraSkin approved if at all possible. I did fax notes into them today and I Georgina Peer try to give a call as well. Overall the wound appears to be doing decently well today. 04/20/2021 upon evaluation today patient actually appears to be doing quite well in regard to his wound with that being said we are trying to  see what we can do about speeding up the healing process. For that reason we did discuss the possibility of a skin substitute. We got the Apligraf approved. For that reason we will get a go ahead and see what we can do with the Apligraf at this point. I am still trying to get the TheraSkin approved but I have not heard anything from the insurance company yet I have called and talked to them earlier in the week and to be honest this was about half an hour that I spent on the phone they told me I would hear something within 10-15 business days. 04/27/2021 upon evaluation today patient appears to be doing excellent in regard to his wound. I do believe the Apligraf has been beneficial. With that being said he has had a little bit of increased pain has been a little bit concerned. I do want to go ahead and apply his steroid cream today the clobetasol and then put the Apligraf over I just do not think I want to risk not putting the clobetasol on based on what is been going on here currently. Patient voiced understanding. 05/04/2021 upon evaluation today patient is making excellent improvement. Overall there is definitely decrease in the size of the wound today and I am very pleased in that regard. I do not see any signs of active infection locally nor systemically at this point. No fevers, chills, nausea, vomiting, or diarrhea. 05/11/2021 upon evaluation today patient appears to be doing well with regard to his wound. He has been tolerating Apligraf's without complication and is making excellent progress this is application #4 today. 12/30; patient here for Apligraf application. Appears to be doing well small wound there has been a lot of healing 05/28/2021 upon evaluation today patient appears to be doing well with regard to his wound. Has been tolerating the dressing changes without complication. Fortunately I do not see any signs of active infection locally nor systemically at this point which is great  news. He is done with the Apligraf and the first round and overall this has filled in quite nicely I am not even certain he needs any additional at the moment. I would actually try to go back to clobetasol with [facility] which previously was doing well for him. 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 He is using clobetasol and Hydrofera Blue. Surface area of the wound is smaller and the surface looks very healthy. 06/11/2021 upon evaluation today patient actually appears to be showing signs of good improvement which is great news. Fortunately I do not see any signs of active infection at this time which is also excellent news. I do believe that the patient is doing well with the clobetasol and Hydrofera Blue. He does have some irritation and itching around the rest of the leg will use some triamcinolone over this area. 06/18/2021 upon evaluation today patient appears to be doing well with regard to his wound. He is showing signs of excellent improvement and overall I am extremely pleased  with where we stand today. We are moving in the right direction. Objective Schulke, Kodey ([id number]) Constitutional Well-nourished and well-hydrated in no acute distress. Vitals Time Taken: 8:13 AM, Height: 71 in, Weight: 338 lbs, BMI: 47.1, Temperature: 98.5 F, Pulse: 90 bpm, Respiratory Rate: 16 breaths/min, Blood Pressure: 127/84 mmHg. Respiratory normal breathing without difficulty. Psychiatric this patient is able to make decisions and demonstrates good insight into disease process. Alert and Oriented x 3. pleasant and cooperative. General Notes: Upon inspection patient's wound bed showed signs of good granulation and epithelization at this point. I did not see any evidence of active infection locally nor systemically at this time which is great news and overall I am extremely happy with where we stand currently. Integumentary (Hair, Skin) Wound #1  status is Open. Original cause of wound was Gradually Appeared. The date acquired was: 11/18/2015. The wound has been in treatment 236 weeks. The wound is located on the Left,Lateral Lower Leg. The wound measures 2.6cm length x 2cm width x 0.1cm depth; 4.084cm^2 area and 0.408cm^3 volume. There is Fat Layer (Subcutaneous Tissue) exposed. There is no tunneling or undermining noted. There is a medium amount of serosanguineous drainage noted. The wound margin is flat and intact. There is large (67-100%) red, pink granulation within the wound bed. There is a small (1-33%) amount of necrotic tissue within the wound bed including Adherent Slough. 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 Nurse Visit as needed - twice a week Bathing/ Shower/ Hygiene: Clean wound with Normal Saline or wound cleanser. May shower with wound dressing protected with water repellent cover or cast protector. Edema Control - Lymphedema / Segmental Compressive Device / Other: Optional: One layer of unna paste to top of compression wrap (to act as an anchor). Elevate, Exercise Daily and Avoid Standing for Long Periods of Time. Elevate legs to the level of the heart and pump ankles as often as possible Elevate leg(s) parallel to the floor when sitting. WOUND #1: - Lower Leg Wound Laterality: Left, Lateral Cleanser: Wound Cleanser 3 x Per Week/30 Days Discharge Instructions: Wash your hands with soap and water. Remove old dressing, discard into plastic bag and place into trash. Cleanse the wound with Wound Cleanser prior to applying a clean dressing using gauze sponges, not tissues or cotton balls. Do not scrub or use excessive  force. Pat dry using gauze sponges, not tissue or cotton balls. Peri-Wound Care: Moisturizing Lotion 3 x Per Week/30 Days Discharge Instructions: Suggestions: Theraderm, Eucerin, Cetaphil, or patient preference. Topical: Clobetasol Propionate ointment 0.05%, 60 (g) tube 3 x Per Week/30 Days Discharge Instructions: Pt to bring to appt- Topical: Triamcinolone Acetonide Cream, 0.1%, 15 (g) tube 3 x Per Week/30 Days Discharge Instructions: Apply as directed by provider. To leg not on wound Primary Dressing: Hydrofera Blue Ready Transfer Foam, 4x5 (in/in) 3 x Per Week/30 Days Discharge Instructions: Apply Hydrofera Blue Ready to wound bed as directed Secondary Dressing: Zetuvit Plus Silicone Non-bordered 5x5 (in/in) 3 x Per Week/30 Days Compression Wrap: Medichoice 4 layer Compression System, 35-40 mmHG (Generic) 3 x Per Week/30 Days Discharge Instructions: Apply multi-layer wrap as directed. Wynder, Naheim ([id number]) 1. I would recommend that we going continue with the wound care measures as before and the patient is in agreement with plan. This includes the use  of the clobetasol followed by the [facility] which I think is doing an awesome job. 2. I am also can recommend that we have the patient continue with the 4-layer compression wrap which I think is doing a great job keeping his edema under control. 3. I am also going to suggest the patient continue to elevate his leg is much as possible to help with edema control. We will see patient back for reevaluation in 1 week here in the clinic. If anything worsens or changes patient will contact our office for additional recommendations. Electronic Signature(s) Signed: 06/18/2021 9:51:21 AM By: Worthy Keeler PA-C Entered By: Worthy Keeler on 06/18/2021 09:51:20 Mulnix, [name] ([id number]) -------------------------------------------------------------------------------- SuperBill Details Patient Name: [name] Date of Service:  06/18/2021 Medical Record Number: [id number] Patient Account Number: [id number] Date of Birth/Sex: 04-15-1981 (40 y.o. M) Treating RN: [name] Primary Care Provider: [name] Other Clinician: Referring Provider: [name] Treating Provider/Extender: [name] Cliche in Treatment: 236 Diagnosis Coding ICD-10 Codes Code Description I87.2 Venous insufficiency (chronic) (peripheral) L97.222 Non-pressure chronic ulcer of left calf with fat layer exposed E11.622 Type 2 diabetes mellitus with other skin ulcer L88 Pyoderma gangrenosum F17.208 Nicotine dependence, unspecified, with other nicotine-induced disorders Facility Procedures CPT4 Code: [id number] Description: (Facility Use Only) [phone] - [place] LWR LT LEG Modifier: Quantity: 1 Physician Procedures CPT4 Code: 9485462 Description: 70350 - WC PHYS LEVEL 3 - EST PT Modifier: Quantity: 1 CPT4 Code: Description: ICD-10 Diagnosis Description I87.2 Venous insufficiency (chronic) (peripheral) L97.222 Non-pressure chronic ulcer of left calf with fat layer exposed E11.622 Type 2 diabetes mellitus with other skin ulcer L88 Pyoderma gangrenosum Modifier: Quantity: Electronic Signature(s) Signed: 06/18/2021 9:51:45 AM By: Worthy Keeler PA-C Entered By: Worthy Keeler on 06/18/2021 09:51:44

## 2021-06-20 ENCOUNTER — Encounter: Payer: 59 | Attending: Physician Assistant

## 2021-06-20 ENCOUNTER — Other Ambulatory Visit: Payer: Self-pay

## 2021-06-20 DIAGNOSIS — L97222 Non-pressure chronic ulcer of left calf with fat layer exposed: Secondary | ICD-10-CM | POA: Diagnosis not present

## 2021-06-20 DIAGNOSIS — I872 Venous insufficiency (chronic) (peripheral): Secondary | ICD-10-CM | POA: Diagnosis present

## 2021-06-20 DIAGNOSIS — L88 Pyoderma gangrenosum: Secondary | ICD-10-CM | POA: Diagnosis not present

## 2021-06-20 DIAGNOSIS — E11622 Type 2 diabetes mellitus with other skin ulcer: Secondary | ICD-10-CM | POA: Insufficient documentation

## 2021-06-20 DIAGNOSIS — F17208 Nicotine dependence, unspecified, with other nicotine-induced disorders: Secondary | ICD-10-CM | POA: Diagnosis not present

## 2021-06-20 NOTE — Progress Notes (Signed)
KEYTON, BHAT ([id number]) Visit Report for 06/20/2021 Arrival Information Details Patient Name: [name], [name] Date of Service: 06/20/2021 8:00 AM Medical Record Number: [id number] Patient Account Number: [id number] Date of Birth/Sex: February 21, 1981 (41 y.o. M) Treating RN: Donnamarie Poag Primary Care Bassy Fetterly: [name] Other Clinician: Referring Medha Pippen: [name] Treating Chika Cichowski/Extender: Yaakov Guthrie in Treatment: 19 Visit Information History Since Last Visit Added or deleted any medications: No Patient Arrived: Ambulatory Had a fall or experienced change in No Arrival Time: 08:05 activities of daily living that may affect Accompanied By: self risk of falls: Transfer Assistance: None Hospitalized since last visit: No Patient Identification Verified: Yes Has Dressing in Place as Prescribed: Yes Secondary Verification Process Completed: Yes Has Compression in Place as Prescribed: Yes Patient Requires Transmission-Based No Pain Present Now: No Precautions: Patient Has Alerts: Yes Patient Alerts: Patient has reaction to silver dressings. Electronic Signature(s) Signed: 06/20/2021 12:24:38 PM By: Donnamarie Poag Entered By: Donnamarie Poag on 06/20/2021 08:26:20 [name] ([id number]) -------------------------------------------------------------------------------- Compression Therapy Details Patient Name: [name] Date of Service: 06/20/2021 8:00 AM Medical Record Number: [id number] Patient Account Number: [id number] Date of Birth/Sex: February 07, 1981 (40 y.o. M) Treating RN: Donnamarie Poag Primary Care Linda Grimmer: [name] Other Clinician: Referring Aleric Froelich: [name] Treating Kaydince Towles/Extender: Yaakov Guthrie in Treatment: 237 Compression Therapy Performed for Wound Assessment: Wound #1 Left,Lateral Lower Leg Performed By: Junius Argyle, RN Compression Type: Four Layer Electronic Signature(s) Signed: 06/20/2021 12:24:38 PM By: Donnamarie Poag Entered By: Donnamarie Poag on 06/20/2021 08:27:04 [name] ([id number]) -------------------------------------------------------------------------------- Encounter Discharge Information Details Patient Name: [name] Date of Service: 06/20/2021 8:00 AM Medical Record Number: [id number] Patient Account Number: [id number] Date of Birth/Sex: Apr 27, 1981 (40 y.o. M) Treating RN: Donnamarie Poag Primary Care Kimm Sider: [name] Other Clinician: Referring Jobany Montellano: [name] Treating Cayce Quezada/Extender: Yaakov Guthrie in Treatment: 237 Encounter Discharge Information Items Discharge Condition: Stable Ambulatory Status: Ambulatory Discharge Destination: Home Transportation: Private Auto Accompanied By: self Schedule Follow-up Appointment: Yes Clinical Summary of Care: Electronic Signature(s) Signed: 06/20/2021 12:24:38 PM By: Donnamarie Poag Entered By: Donnamarie Poag on 06/20/2021 62:26:33 [name] ([id number]) -------------------------------------------------------------------------------- Wound Assessment Details Patient Name: [name] Date of Service: 06/20/2021 8:00 AM Medical Record Number: [id number] Patient Account Number: [id number] Date of Birth/Sex: April 01, 1981 (40 y.o. M) Treating RN: Donnamarie Poag Primary Care Dayanis Bergquist: [name] Other Clinician: Referring Cydnee Fuquay: [name] Treating Stephens Shreve/Extender: Yaakov Guthrie in Treatment: 237 Wound Status Wound Number: 1 Primary Etiology: Pyoderma Wound Location: Left, Lateral Lower Leg Wound Status: Open Wounding Event: Gradually Appeared Comorbid History: Sleep Apnea, Hypertension, Colitis Date Acquired: 11/18/2015 Weeks Of Treatment: 237 Clustered Wound: No Wound Measurements Length: (cm) 2.6 Width: (cm) 2 Depth: (cm) 0.1 Area: (cm) 4.084 Volume: (cm) 0.408 % Reduction in Area: 16.8% % Reduction in Volume: 89.6% Epithelialization: Medium (34-66%) Wound  Description Classification: Full Thickness With Exposed Support Structures Wound Margin: Flat and Intact Exudate Amount: Medium Exudate Type: Serosanguineous Exudate Color: red, brown Foul Odor After Cleansing: No Slough/Fibrino Yes Wound Bed Granulation Amount: Large (67-100%) Exposed Structure Granulation Quality: Red, Pink Fascia Exposed: No Necrotic Amount: Small (1-33%) Fat Layer (Subcutaneous Tissue) Exposed: Yes Necrotic Quality: Adherent Slough Tendon Exposed: No Muscle Exposed: No Joint Exposed: No Bone Exposed: No Treatment Notes Wound #1 (Lower Leg) Wound Laterality: Left, Lateral Cleanser Wound Cleanser Discharge Instruction: Wash your hands with soap and water. Remove old dressing, discard into plastic bag and place into trash. Cleanse the wound with Wound Cleanser prior to applying a clean dressing using gauze sponges,  not tissues or cotton balls. Do not scrub or use excessive force. Pat dry using gauze sponges, not tissue or cotton balls. Peri-Wound Care Moisturizing Lotion Discharge Instruction: Suggestions: Theraderm, Eucerin, Cetaphil, or patient preference. Topical Clobetasol Propionate ointment 0.05%, 60 (g) tube Discharge Instruction: Pt to bring to appt- Triamcinolone Acetonide Cream, 0.1%, 15 (g) tube Discharge Instruction: Apply as directed by Emer [name]. To leg not on wound Primary Dressing Hydrofera Blue Ready Transfer Foam, 4x5 (in/in) Discharge Instruction: Apply Hydrofera Blue Ready to wound bed as directed DARRIOUS, YOUMAN ([id number]) Secondary Dressing Zetuvit Plus Silicone Non-bordered 5x5 (in/in) Secured With Compression Wrap Medichoice 4 layer Compression System, 35-40 mmHG Discharge Instruction: Apply multi-layer wrap as directed. Compression Stockings Add-Ons Electronic Signature(s) Signed: 06/20/2021 12:24:38 PM By: Donnamarie Poag Entered ByDonnamarie Poag on 06/20/2021 08:26:50

## 2021-06-21 NOTE — Progress Notes (Signed)
[name], [name] ([id number]) Visit Report for 06/20/2021 Physician Orders Details Patient Name: [name], [name] Date of Service: 06/20/2021 8:00 AM Medical Record Number: [id number] Patient Account Number: [id number] Date of Birth/Sex: [birth year] (41 y.o. M) Treating RN: Hansel Feinstein Primary Care Provider: [name] Other Clinician: Referring Provider: [name] Treating Provider/Extender: Tilda Franco in Treatment: [phone] Verbal / Phone Orders: No Diagnosis Coding Follow-up Appointments o Return Appointment in 1 week. o Nurse Visit as needed - twice a week Bathing/ Shower/ Hygiene o Clean wound with Normal Saline or wound cleanser. o May shower with wound dressing protected with water repellent cover or cast protector. Edema Control - Lymphedema / Segmental Compressive Device / Other o Optional: One layer of unna paste to top of compression wrap (to act as an anchor). o Elevate, Exercise Daily and Avoid Standing for Long Periods of Time. o Elevate legs to the level of the heart and pump ankles as often as possible o Elevate leg(s) parallel to the floor when sitting. Wound Treatment Wound #1 - Lower Leg Wound Laterality: Left, Lateral Cleanser: Wound Cleanser 3 x Per Week/30 Days Discharge Instructions: Wash your hands with soap and water. Remove old dressing, discard into plastic bag and place into trash. Cleanse the wound with Wound Cleanser prior to applying a clean dressing using gauze sponges, not tissues or cotton balls. Do not scrub or use excessive force. Pat dry using gauze sponges, not tissue or cotton balls. Peri-Wound Care: Moisturizing Lotion 3 x Per Week/30 Days Discharge Instructions: Suggestions: Theraderm, Eucerin, Cetaphil, or patient preference. Topical: Clobetasol Propionate ointment 0.05%, 60 (g) tube 3 x Per Week/30 Days Discharge Instructions: Pt to bring to appt- Topical: Triamcinolone Acetonide Cream, 0.1%, 15 (g) tube 3 x Per  Week/30 Days Discharge Instructions: Apply as directed by provider. To leg not on wound Primary Dressing: Hydrofera Blue Ready Transfer Foam, 4x5 (in/in) 3 x Per Week/30 Days Discharge Instructions: Apply Hydrofera Blue Ready to wound bed as directed Secondary Dressing: Zetuvit Plus Silicone Non-bordered 5x5 (in/in) 3 x Per Week/30 Days Compression Wrap: Medichoice 4 layer Compression System, 35-40 mmHG (Generic) 3 x Per Week/30 Days Discharge Instructions: Apply multi-layer wrap as directed. Electronic Signature(s) Signed: 06/20/2021 12:24:38 PM By: Hansel Feinstein Signed: 06/21/2021 4:26:06 PM By: Geralyn Corwin DO Entered By: Hansel Feinstein on 06/20/2021 08:27:29 [name] ([id number]) -------------------------------------------------------------------------------- SuperBill Details Patient Name: [name] Date of Service: 06/20/2021 Medical Record Number: [id number] Patient Account Number: [id number] Date of Birth/Sex: 1981-01-03 (40 y.o. 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

## 2021-06-22 ENCOUNTER — Ambulatory Visit (INDEPENDENT_AMBULATORY_CARE_PROVIDER_SITE_OTHER): Payer: 59 | Admitting: Nurse Practitioner

## 2021-06-22 ENCOUNTER — Encounter: Payer: Self-pay | Admitting: Nurse Practitioner

## 2021-06-22 ENCOUNTER — Other Ambulatory Visit: Payer: Self-pay

## 2021-06-22 VITALS — BP 138/64 | HR 101 | Temp 98.3°F | Resp 14 | Wt 352.4 lb

## 2021-06-22 DIAGNOSIS — J0140 Acute pansinusitis, unspecified: Secondary | ICD-10-CM

## 2021-06-22 DIAGNOSIS — Z720 Tobacco use: Secondary | ICD-10-CM | POA: Diagnosis not present

## 2021-06-22 DIAGNOSIS — I872 Venous insufficiency (chronic) (peripheral): Secondary | ICD-10-CM | POA: Diagnosis not present

## 2021-06-22 MED ORDER — DOXYCYCLINE HYCLATE 100 MG PO TABS
100.0000 mg | ORAL_TABLET | Freq: Two times a day (BID) | ORAL | 0 refills | Status: AC
Start: 2021-06-22 — End: 2021-06-29

## 2021-06-22 NOTE — Assessment & Plan Note (Signed)
Patient still currently smoking.  We will use an atypical antibiotic to cover respiratory along with sinus bacteria

## 2021-06-22 NOTE — Progress Notes (Signed)
Acute Office Visit  Subjective:    Patient ID: [name], male    DOB: [birth year], 41 y.o.   MRN: [id number]  Chief Complaint  Patient presents with   Sinus Problem    Sx started on 06/17/21-Sinus pressure/pain, runny nose, cough, post nasal drip, slight headache. No fever. Covid test negative on 06/17/21     Patient is in today for sinus issue  Symptoms started on 06/17/2021 Covid test was negative on 06/17/2021  No covid  vaccine Was at a conference and several folks came back positive.  Tylenol severe sinus and vicks nasal spray (helped) Slightly better per patient report  Past Medical History:  Diagnosis Date   Blood in stool    Depression    Elevated blood pressure    Hyperlipidemia    OSA (obstructive sleep apnea) 2014   Seasonal allergies    Ulcerative colitis ([place]) 03/05/2018    Past Surgical History:  Procedure Laterality Date   SEPTOPLASTY  2007   WRIST SURGERY      Family History  Problem Relation Age of Onset   Alcohol abuse Paternal Aunt    Alcohol abuse Paternal Uncle    Stroke Paternal Uncle    Hyperlipidemia Maternal Grandfather    Hypertension Maternal Grandfather    Diabetes Maternal Grandfather    Alcohol abuse Maternal Grandfather    Multiple sclerosis Mother    Dementia Mother     Social History   Socioeconomic History   Marital status: Married    Spouse name: Not on file   Number of children: Not on file   Years of education: Not on file   Highest education level: Not on file  Occupational History   Not on file  Tobacco Use   Smoking status: Every Day    Packs/day: 1.00    Years: 14.00    Pack years: 14.00    Types: Cigarettes    Last attempt to quit: 11/25/2018    Years since quitting: 2.5   Smokeless tobacco: Former    Quit date: 03/17/2015  Vaping Use   Vaping Use: Never used  Substance and Sexual Activity   Alcohol use: Not Currently    Alcohol/week: 0.0 standard drinks    Comment: soical   Drug use: Not on file    Sexual activity: Not on file  Other Topics Concern   Not on file  Social History Narrative   Married.   7 kids.   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   Enjoys target shooting.    Social Determinants of Health   Financial Resource Strain: Not on file  Food Insecurity: Not on file  Transportation Needs: Not on file  Physical Activity: Not on file  Stress: Not on file  Social Connections: Not on file  Intimate Partner Violence: Not on file    Outpatient Medications Prior to Visit  Medication Sig Dispense Refill   acetaminophen (TYLENOL) 500 MG tablet Take 1,000 mg by mouth every 6 (six) hours as needed for headache (pain).     atorvastatin (LIPITOR) 40 MG tablet Take 1 tablet (40 mg total) by mouth daily. For cholesterol. 90 tablet 3   blood glucose meter kit and supplies KIT Dispense based on patient and insurance preference. Use up to four times daily as directed. (FOR ICD-9 250.00, 250.01). 1 each 0   buPROPion (WELLBUTRIN SR) 100 MG 12 hr tablet Take 1 tablet (100 mg total) by mouth 2 (two) times daily. For smoking 60 tablet 1  clobetasol ointment (TEMOVATE) 8.67 % Apply 1 application topically 2 (two) times daily.     Continuous Blood Gluc Receiver (DEXCOM G6 RECEIVER) DEVI 1 Device by Does not apply route continuous. 1 each 0   Continuous Blood Gluc Sensor (DEXCOM G6 SENSOR) MISC Apply 1 sensor into the skin every 10 days. 9 each 1   Continuous Blood Gluc Transmit (DEXCOM G6 TRANSMITTER) MISC 1 Device by Does not apply route continuous. 1 each 0   Dulaglutide (TRULICITY) 5.44 BE/0.1EO SOPN Inject 0.75 mg into the skin once a week. For diabetes. 6 mL 1   hydrocortisone 2.5 % ointment Apply topically.     ibuprofen (ADVIL,MOTRIN) 200 MG tablet Take 400-600 mg by mouth every 6 (six) hours as needed for headache (pain).     inFLIXimab (REMICADE) 100 MG injection Inject into the muscle once a week.     Insulin Pen Needle (RELION PEN NEEDLES) 31G X 6 MM MISC USE 1  AT NIGHT WITH   INSULIN  AS  DIRECTED. 300 each 0   ketoconazole (NIZORAL) 2 % cream Apply topically 2 (two) times daily.     LANTUS SOLOSTAR 100 UNIT/ML Solostar Pen Inject 40 Units into the skin at bedtime. For diabetes. 45 mL 0   metFORMIN (GLUCOPHAGE) 1000 MG tablet TAKE 1 TABLET BY MOUTH TWICE DAILY WITH MEALS FOR DIABETES 180 tablet 3   mupirocin ointment (BACTROBAN) 2 % Apply topically two (2) 60g times a day. Mix with clobetasol and apply twice daily     SANTYL ointment Apply topically.     triamcinolone ointment (KENALOG) 0.1 % Apply topically.     venlafaxine XR (EFFEXOR-XR) 150 MG 24 hr capsule Take 1 capsule (150 mg total) by mouth daily with breakfast. For depression. 90 capsule 3   varenicline (CHANTIX) 1 MG tablet Take 1/2 tablet daily for three days, then 1/2 tablet twice daily for five days, then 1 tablet twice daily thereafter for smoking cessation. 60 tablet 0   No facility-administered medications prior to visit.    Allergies  Allergen Reactions   Biaxin [Clarithromycin] Other (See Comments)    Causes colitis flares   Milk-Related Compounds Hives    Review of Systems  Constitutional:  Positive for fatigue. Negative for appetite change, chills and fever.  HENT:  Positive for congestion, ear pain (left side feels watery), sinus pressure and sinus pain. Negative for sore throat.   Respiratory:  Positive for cough (Thin yellow). Negative for shortness of breath.   Cardiovascular:  Negative for chest pain.  Gastrointestinal:  Negative for abdominal pain, constipation, nausea and vomiting.  Musculoskeletal:  Negative for arthralgias and myalgias.  Neurological:  Positive for headaches. Negative for dizziness and light-headedness.      Objective:    Physical Exam Vitals and nursing note reviewed.  Constitutional:      Appearance: He is obese.  HENT:     Right Ear: Tympanic membrane, ear canal and external ear normal.     Left Ear: Tympanic membrane, ear canal and external ear  normal.     Nose:     Right Sinus: Maxillary sinus tenderness and frontal sinus tenderness present.     Left Sinus: Maxillary sinus tenderness and frontal sinus tenderness present.     Mouth/Throat:     Mouth: Mucous membranes are moist.     Pharynx: Oropharynx is clear.  Eyes:     Comments: Wears corrective lenses  Cardiovascular:     Rate and Rhythm: Normal rate and regular rhythm.  Heart sounds: Normal heart sounds.  Pulmonary:     Effort: Pulmonary effort is normal.     Breath sounds: Normal breath sounds.  Abdominal:     General: Bowel sounds are normal.  Skin:    General: Skin is warm.  Neurological:     Mental Status: He is alert.    BP 138/64    Pulse (!) 101    Temp 98.3 F (36.8 C)    Resp 14    Wt (!) 352 lb 6 oz (159.8 kg)    SpO2 95%    BMI 49.15 kg/m  Wt Readings from Last 3 Encounters:  06/22/21 (!) 352 lb 6 oz (159.8 kg)  05/01/21 (!) 346 lb (156.9 kg)  11/28/20 (!) 333 lb (151 kg)    Health Maintenance Due  Topic Date Due   COVID-19 Vaccine (1) Never done   Hepatitis C Screening  Never done   FOOT EXAM  09/07/2020   URINE MICROALBUMIN  06/28/2021    There are no preventive care reminders to display for this patient.   Lab Results  Component Value Date   TSH 3.05 06/17/2018   Lab Results  Component Value Date   WBC 9.0 06/28/2020   HGB 15.3 06/28/2020   HCT 45.2 06/28/2020   MCV 89.4 06/28/2020   PLT 209.0 06/28/2020   Lab Results  Component Value Date   NA 135 06/28/2020   K 4.4 06/28/2020   CO2 28 06/28/2020   GLUCOSE 215 (H) 06/28/2020   BUN 12 06/28/2020   CREATININE 0.96 06/28/2020   BILITOT 0.6 06/28/2020   ALKPHOS 121 (H) 06/28/2020   AST 38 (H) 06/28/2020   ALT 77 (H) 06/28/2020   PROT 7.0 06/28/2020   ALBUMIN 4.2 06/28/2020   CALCIUM 9.1 06/28/2020   ANIONGAP 9 08/14/2019   GFR 98.23 06/28/2020   Lab Results  Component Value Date   CHOL 116 06/28/2020   Lab Results  Component Value Date   HDL 41.50 06/28/2020    Lab Results  Component Value Date   LDLCALC 63 06/28/2020   Lab Results  Component Value Date   TRIG 61.0 06/28/2020   Lab Results  Component Value Date   CHOLHDL 3 06/28/2020   Lab Results  Component Value Date   HGBA1C 7.0 (A) 05/01/2021       Assessment & Plan:   Problem List Items Addressed This Visit       Respiratory   Acute non-recurrent pansinusitis - Primary    Given physical exam, length of illness patient is on immune suppressing medication will like to treat patient with doxycycline 100 mg twice daily for 7 days.  Return to clinic precautions discussed      Relevant Medications   doxycycline (VIBRA-TABS) 100 MG tablet     Other   Tobacco abuse    Patient still currently smoking.  We will use an atypical antibiotic to cover respiratory along with sinus bacteria        Meds ordered this encounter  Medications   doxycycline (VIBRA-TABS) 100 MG tablet    Sig: Take 1 tablet (100 mg total) by mouth 2 (two) times daily for 7 days.    Dispense:  14 tablet    Refill:  0    Order Specific Question:   Supervising Provider    Answer:   Loura Pardon A [[id number]]   This visit occurred during the SARS-CoV-2 public health emergency.  Safety protocols were in place, including screening questions prior to  the visit, additional usage of staff PPE, and extensive cleaning of exam room while observing appropriate contact time as indicated for disinfecting solutions.   Romilda Garret, NP

## 2021-06-22 NOTE — Progress Notes (Signed)
QUAID, YEAKLE ([id number]) Visit Report for 06/22/2021 Arrival Information Details Patient Name: [name], [name] Date of Service: 06/22/2021 8:00 AM Medical Record Number: [id number] Patient Account Number: [id number] Date of Birth/Sex: Jun 10, 1980 (41 y.o. M) Treating RN: Donnamarie Poag Primary Care Dellia Donnelly: [name] Other Clinician: Referring Jaelie Aguilera: [name] Treating Lezlee Gills/Extender: [name] Cliche in Treatment: 47 Visit Information History Since Last Visit Added or deleted any medications: No Patient Arrived: Ambulatory Had a fall or experienced change in No Arrival Time: 08:00 activities of daily living that may affect Accompanied By: self risk of falls: Transfer Assistance: None Hospitalized since last visit: No Patient Identification Verified: Yes Has Dressing in Place as Prescribed: Yes Secondary Verification Process Completed: Yes Has Compression in Place as Prescribed: Yes Patient Requires Transmission-Based No Pain Present Now: No Precautions: Patient Has Alerts: Yes Patient Alerts: Patient has reaction to silver dressings. Electronic Signature(s) Signed: 06/22/2021 12:52:45 PM By: Donnamarie Poag Entered By: Donnamarie Poag on 06/22/2021 08:14:51 Duma, [name] ([id number]) -------------------------------------------------------------------------------- Compression Therapy Details Patient Name: [name] Date of Service: 06/22/2021 8:00 AM Medical Record Number: [id number] Patient Account Number: [id number] Date of Birth/Sex: [birth year] (40 y.o. M) Treating RN: Donnamarie Poag Primary Care Kenae Lindquist: [name] Other Clinician: Referring Eugene Isadore: [name] Treating Natha Guin/Extender: [name] Cliche in Treatment: 237 Compression Therapy Performed for Wound Assessment: Wound #1 Left,Lateral Lower Leg Performed By: Junius Argyle, RN Compression Type: Four Layer Electronic Signature(s) Signed: 06/22/2021 12:52:45 PM By: Donnamarie Poag Entered  By: Donnamarie Poag on 06/22/2021 08:16:21 [name] ([id number]) -------------------------------------------------------------------------------- Encounter Discharge Information Details Patient Name: [name] Date of Service: 06/22/2021 8:00 AM Medical Record Number: [id number] Patient Account Number: [id number] Date of Birth/Sex: 03/17/1981 (40 y.o. M) Treating RN: Donnamarie Poag Primary Care Jozette Castrellon: [name] Other Clinician: Referring Lougenia Morrissey: [name] Treating Zaivion Kundrat/Extender: [name] Cliche in Treatment: 68 Encounter Discharge Information Items Discharge Condition: Stable Ambulatory Status: Ambulatory Discharge Destination: Home Transportation: Other Accompanied By: self Schedule Follow-up Appointment: Yes Clinical Summary of Care: Electronic Signature(s) Signed: 06/22/2021 12:52:45 PM By: Donnamarie Poag Entered By: Donnamarie Poag on 06/22/2021 08:21:48 [name] ([id number]) -------------------------------------------------------------------------------- Wound Assessment Details Patient Name: [name] Date of Service: 06/22/2021 8:00 AM Medical Record Number: [id number] Patient Account Number: [id number] Date of Birth/Sex: [birth year] (40 y.o. M) Treating RN: Donnamarie Poag Primary Care Callan Norden: [name] Other Clinician: Referring Sashia Campas: [name] Treating Donne Baley/Extender: [name] Cliche in Treatment: 237 Wound Status Wound Number: 1 Primary Etiology: Pyoderma Wound Location: Left, Lateral Lower Leg Wound Status: Open Wounding Event: Gradually Appeared Comorbid History: Sleep Apnea, Hypertension, Colitis Date Acquired: 11/18/2015 Weeks Of Treatment: 237 Clustered Wound: No Wound Measurements Length: (cm) 2.6 Width: (cm) 2 Depth: (cm) 0.1 Area: (cm) 4.084 Volume: (cm) 0.408 % Reduction in Area: 16.8% % Reduction in Volume: 89.6% Epithelialization: Medium (34-66%) Wound Description Classification: Full Thickness With  Exposed Support Structures Wound Margin: Flat and Intact Exudate Amount: Medium Exudate Type: Serosanguineous Exudate Color: red, brown Foul Odor After Cleansing: No Slough/Fibrino Yes Wound Bed Granulation Amount: Large (67-100%) Exposed Structure Granulation Quality: Red, Pink Fascia Exposed: No Necrotic Amount: Small (1-33%) Fat Layer (Subcutaneous Tissue) Exposed: Yes Necrotic Quality: Adherent Slough Tendon Exposed: No Muscle Exposed: No Joint Exposed: No Bone Exposed: No Treatment Notes Wound #1 (Lower Leg) Wound Laterality: Left, Lateral Cleanser Wound Cleanser Discharge Instruction: Wash your hands with soap and water. Remove old dressing, discard into plastic bag and place into trash. Cleanse the wound with Wound Cleanser prior to applying a clean dressing using gauze sponges, not  tissues or cotton balls. Do not scrub or use excessive force. Pat dry using gauze sponges, not tissue or cotton balls. Peri-Wound Care Moisturizing Lotion Discharge Instruction: Suggestions: Theraderm, Eucerin, Cetaphil, or patient preference. Topical Clobetasol Propionate ointment 0.05%, 60 (g) tube Discharge Instruction: Pt to bring to appt- Triamcinolone Acetonide Cream, 0.1%, 15 (g) tube Discharge Instruction: Apply as directed by Mykalah Saari. To leg not on wound Primary Dressing Hydrofera Blue Ready Transfer Foam, 4x5 (in/in) Discharge Instruction: Apply Hydrofera Blue Ready to wound bed as directed CAMILLE, THAU ([id number]) Secondary Dressing Zetuvit Plus Silicone Non-bordered 5x5 (in/in) Secured With Compression Wrap Medichoice 4 layer Compression System, 35-40 mmHG Discharge Instruction: Apply multi-layer wrap as directed. Compression Stockings Add-Ons Electronic Signature(s) Signed: 06/22/2021 12:52:45 PM By: Donnamarie Poag Entered ByDonnamarie Poag on 06/22/2021 08:15:16

## 2021-06-22 NOTE — Assessment & Plan Note (Signed)
Given physical exam, length of illness patient is on immune suppressing medication will like to treat patient with doxycycline 100 mg twice daily for 7 days.  Return to clinic precautions discussed

## 2021-06-22 NOTE — Progress Notes (Signed)
[name], BROADWATER ([id number]) Visit Report for 06/22/2021 Physician Orders Details Patient Name: [name], [name] Date of Service: 06/22/2021 8:00 AM Medical Record Number: [id number] Patient Account Number: [id number] Date of Birth/Sex: 1980/08/30 (41 y.o. M) Treating RN: Donnamarie Poag Primary Care Provider: [name] Other Clinician: Referring Provider: [name] Treating Provider/Extender: [name] Cliche in Treatment: [phone] Verbal / Phone Orders: No Diagnosis Coding Follow-up Appointments o Return Appointment in 1 week. o Nurse Visit as needed - twice a week Bathing/ Shower/ Hygiene o Clean wound with Normal Saline or wound cleanser. o May shower with wound dressing protected with water repellent cover or cast protector. Edema Control - Lymphedema / Segmental Compressive Device / Other o Optional: One layer of unna paste to top of compression wrap (to act as an anchor). o Elevate, Exercise Daily and Avoid Standing for Long Periods of Time. o Elevate legs to the level of the heart and pump ankles as often as possible o Elevate leg(s) parallel to the floor when sitting. Wound Treatment Wound #1 - Lower Leg Wound Laterality: Left, Lateral Cleanser: Wound Cleanser 3 x Per Week/30 Days Discharge Instructions: Wash your hands with soap and water. Remove old dressing, discard into plastic bag and place into trash. Cleanse the wound with Wound Cleanser prior to applying a clean dressing using gauze sponges, not tissues or cotton balls. Do not scrub or use excessive force. Pat dry using gauze sponges, not tissue or cotton balls. Peri-Wound Care: Moisturizing Lotion 3 x Per Week/30 Days Discharge Instructions: Suggestions: Theraderm, Eucerin, Cetaphil, or patient preference. Topical: Clobetasol Propionate ointment 0.05%, 60 (g) tube 3 x Per Week/30 Days Discharge Instructions: Pt to bring to appt- Topical: Triamcinolone Acetonide Cream, 0.1%, 15 (g) tube 3 x Per Week/30  Days Discharge Instructions: Apply as directed by provider. To leg not on wound Primary Dressing: Hydrofera Blue Ready Transfer Foam, 4x5 (in/in) 3 x Per Week/30 Days Discharge Instructions: Apply Hydrofera Blue Ready to wound bed as directed Secondary Dressing: Zetuvit Plus Silicone Non-bordered 5x5 (in/in) 3 x Per Week/30 Days Compression Wrap: Medichoice 4 layer Compression System, 35-40 mmHG (Generic) 3 x Per Week/30 Days Discharge Instructions: Apply multi-layer wrap as directed. Electronic Signature(s) Signed: 06/22/2021 12:52:45 PM By: Donnamarie Poag Signed: 06/22/2021 4:48:07 PM By: Worthy Keeler PA-C Entered By: Donnamarie Poag on 06/22/2021 08:16:51 EON, ZUNKER ([id number]) -------------------------------------------------------------------------------- SuperBill Details Patient Name: [name] Date of Service: 06/22/2021 Medical Record Number: [id number] Patient Account Number: [id number] Date of Birth/Sex: [birth year] (40 y.o. 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

## 2021-06-22 NOTE — Patient Instructions (Signed)
Nice to see you today Sent in medication to pharmacy on file You can continue using over the counter medications. Flonase is a good option to help with some of the pressure. This is over the counter. Follow up if no improvement or symptoms worsen

## 2021-06-25 ENCOUNTER — Other Ambulatory Visit: Payer: Self-pay

## 2021-06-25 ENCOUNTER — Encounter: Payer: 59 | Admitting: Physician Assistant

## 2021-06-25 DIAGNOSIS — I872 Venous insufficiency (chronic) (peripheral): Secondary | ICD-10-CM | POA: Diagnosis not present

## 2021-06-25 NOTE — Progress Notes (Addendum)
[name], [name] ([id number]) Visit Report for 06/25/2021 Chief Complaint Document Details Patient Name: [name], [name] Date of Service: 06/25/2021 8:15 AM Medical Record Number: [id number] Patient Account Number: [id number] Date of Birth/Sex: Feb 05, 1981 (41 y.o. 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 M) Treating RN: Levora Dredge Primary Care Provider: [name] Other Clinician: Referring Provider: [name] Treating Provider/Extender: [name] Cliche in Treatment: 64 History of Present Illness HPI Description: 12/04/16; 41 year old man who comes into the clinic today for review of a wound on the posterior left calf. He tells me that is been there for about a year. He is not a diabetic he does smoke half a pack per day. He was seen in the ER on 11/20/16 felt to have cellulitis around the wound and was given clindamycin. An x-ray did not show osteomyelitis. The patient initially tells me that he has a milk allergy that sets off a pruritic itching rash on his lower legs which she scratches incessantly and he thinks that's what may have set up the wound. He has been using various topical antibiotics and ointments without any effect. He works in a [employer] and is on his feet all day. He does not have a  prior history of wounds however he does have the rash on both lower legs the right arm and the ventral aspect of his left arm. These are excoriations and clearly have had scratching however there are of macular looking areas on both legs including a substantial larger area on the right leg. This does not have an underlying open area. There is no blistering. The patient tells me that 2 years ago in [place] in response to the rash on his legs he saw a dermatologist who told him he had a condition which may be pyoderma gangrenosum although I may be putting words into his mouth. He seemed to recognize this. On further questioning he admits to a 5 year history of quiesced. ulcerative colitis. He is not in any treatment for this. He's had no recent travel 12/11/16; the patient arrives today with his wound and roughly the same condition we've been using silver alginate this is a deep punched out wound with some surrounding erythema but no tenderness. Biopsy I did did not show confirmed pyoderma gangrenosum suggested nonspecific inflammation and vasculitis but does not provide an actual description of what was seen by the pathologist. I'm really not able to understand this We have also received information from the patient's dermatologist in [place] notes from April 2016. This was a doctor Agarwal-antal. 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 He was prescribed topical steroid high potency under occlusion which helped but at this point the patient did not have a deep punched out wound. 12/18/16; the patient's wound is larger in terms of surface area however this surface looks better and there is less depth. The surrounding erythema also is better. The patient states that the wrap we put on came off 2 days ago when he has been using his compression stockings.  He we are in the process of getting a dermatology consult. 12/26/16 on evaluation today patient's left lower extremity wound shows evidence of  infection with surrounding erythema noted. He has been tolerating the dressing changes but states that he has noted more discomfort. There is a larger area of erythema surrounding the wound. No fevers, chills, nausea, or vomiting noted at this time. With that being said the wound still does have slough covering the surface. He is not allergic to any medication that he is aware of at this point. In regard to his right lower extremity he had several regions that are erythematous and pruritic he wonders if there's anything we can do to help that. 01/02/17 I reviewed patient's wound culture which was obtained his visit last week. He was placed on doxycycline at that point. Unfortunately that does not appear to be an antibiotic that would likely help with the situation however the pseudomonas noted on culture is sensitive to Cipro. Also unfortunately patient's wound seems to have a large compared to last week's evaluation. Not severely so but there are definitely increased measurements in general. He is continuing to have discomfort as well he writes this to be a seven out of 10. In fact he would prefer me not to perform any debridement today due to the fact that he is having discomfort and considering he has an active infection on the little reluctant to do so anyway. No fevers, chills, nausea, or vomiting noted at this time. 01/08/17; patient seems dermatology on September 5. I suspect dermatology will want the slides from the biopsy I did sent to their pathologist. I'm not sure if there is a way we can expedite that. In any case the culture I did before I left on vacation 3 weeks ago showed Pseudomonas he was given 10 days of Cipro and per her description of her intake nurses is actually somewhat better this week although the wound is quite a bit bigger than I remember the last time I saw this. He still has 3 more days of Cipro 01/21/17; dermatology appointment tomorrow. He has completed the ciprofloxacin  for Pseudomonas. Surface of the wound looks better however he is had some deterioration in the lesions on his right leg. Meantime the left lateral leg wound we will continue with sample 01/29/17; patient had his dermatology appointment but I can't yet see that note. He is completed his antibiotics. The wound is more superficial but considerably larger in circumferential area than when he came in. This is in his left lateral calf. He also has swollen erythematous areas with superficial wounds on the right leg and small papular areas on both arms. There apparently areas in her his upper thighs and buttocks I did not look at those. Dermatology biopsied the right leg. Hopefully will have their input next week. 02/05/17; patient went back to see his dermatologist who told him that he had a "scratching problem" as well as staph. He is now on a 30 day course of doxycycline and I believe she gave him triamcinolone cream to the right leg areas to help with the itching [not exactly sure but probably triamcinolone]. She apparently looked at the left lateral leg wound although this was not rebiopsied and I think felt to be ultimately part of the same pathogenesis. He is using sample border foam and changing nevus himself. He now has a new open area on the right posterior leg which was his biopsy site I don't have any of the dermatology notes  02/12/17; we put the patient in compression last week with SANTYL to the wound on the left leg and the biopsy. Edema is much better and the depth of the wound is now at level of skin. Area is still the same oBiopsy site on the right lateral leg we've also been using santyl with a border foam dressing and he is changing this himself. 02/19/17; Using silver alginate started last week to both the substantial left leg wound and the biopsy site on the right wound. He is tolerating compression well. Has a an appointment with his primary M.D. tomorrow wondering about diuretics although  I'm wondering if the edema problem is actually lymphedema 02/26/17; the patient has been to see his primary doctor Dr. Jerrel Ivory at [facility] our primary care. She started him on Lasix 20 mg and this seems to have helped with the edema. However we are not making substantial change with the left lateral calf wound and inflammation. The biopsy site on the right leg also looks stable but not really all that different. 03/12/17; the patient has been to see vein and vascular Dr. Lucky Cowboy. He has had venous reflux studies I have not reviewed these. I did get a call from his dermatology office. They felt that he might have pathergy based on their biopsy on his right leg which led them to look at the slides of [name], [name] ([id number]) the biopsy I did on the left leg and they wonder whether this represents pyoderma gangrenosum which was the original supposition in a man with ulcerative colitis albeit inactive for many years. They therefore recommended clobetasol and tetracycline i.e. aggressive treatment for possible pyoderma gangrenosum. 03/26/17; apparently the patient just had reflux studies not an appointment with Dr. dew. She arrives in clinic today having applied clobetasol for 2-3 weeks. He notes over the last 2-3 days excessive drainage having to change the dressing 3-4 times a day and also expanding erythema. 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 Lillia Carmel at [facility] dermatology locally who kindly saw him at my request. 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 My concern was the degree of erythema medially extending from his left leg wound which was either inflammation from pyoderma  or cellulitis. I put him on Augmentin however culture of the wound showed Pseudomonas which is quinolone sensitive. I really don't believe he has cellulitis however in view of everything I will continue and give him a course of Cipro. He is also on doxycycline as an immune modulator for the pyoderma. In addition to his original wound on the left lateral leg with surrounding erythema he has a wound on the right posterior calf which was an original biopsy site done by dermatology. This was felt to represent pathergy from pyoderma gangrenosum 04/16/17; pyoderma gangrenosum. Saw Dr. Lillia Carmel yesterday. He has been using topical antibiotics to both wound areas his original wound on the left and the biopsies/pathergy area on the right. There is definitely some improvement in the inflammation around the wound on the right although the patient states he has increasing sensitivity of the wounds. He is on prednisone 60 and doxycycline 1 as prescribed by Dr. Lillia Carmel. He is covering the topical antibiotic with gauze  and putting this in his own compression stocks and changing this daily. He states that Dr. Lottie Rater did a culture of the left leg wound yesterday 05/07/17; pyoderma gangrenosum. The patient saw Dr. Lillia Carmel yesterday and has a follow-up with her in one month. He is still using topical antibiotics to both wounds although he can't recall exactly what type. He is still on prednisone 60 mg. Dr. Lillia Carmel stated that the doxycycline could stop if we were in agreement. 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 The right medial leg was induced by biopsy/pathergy. The area on the right is essentially healed. Still on high-dose prednisone using topical antibiotics to the wound 07/09/17; pyoderma gangrenosum with wounds on the left lateral leg. The right medial leg has closed and remains closed. He  is still on prednisone 60. oHe tells me he missed his last dermatology appointment with Dr. Lillia Carmel but will make another appointment. He reports that her blood sugar at a recent screen in [place] was high 200's. He was 180 today. 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 I think he is going to require still much longer prednisone perhaps another 3 months before attempting to taper. In the meantime his wound is a lot better. Smaller. He is cleaning this off daily and applying topical antibiotics. When he was last in the clinic I thought about changing to [facility] and actually put in a couple of calls to dermatology although probably not during their business hours. 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 He continues on oral prednisone. He has been using triple antibiotic ointment. There is surface debris and we will transition to [facility] and have him return in 2 weeks. He has lost 30 pounds since his last appointment with lifestyle modification. He may benefit from topical steroid cream for treatment this can be considered at a later date. 08/22/17 on evaluation today patient appears to actually be doing rather well in regard to his left lateral lower extremity ulcer. He has actually been managed by Dr. Dellia Nims most recently. Patient is currently on oral steroids at this time. This seems to have been of benefit for him. Nonetheless his last visit was actually with Leah on 08/06/17. Currently he is not utilizing any topical steroid creams although this could be of benefit as well. No fevers, chills, nausea, or vomiting noted at this time. 09/05/17 on evaluation today patient appears to be doing better in regard to his left lateral lower extremity ulcer. He has been tolerating the dressing changes without complication. He is using Santyl with good effect. Overall I'm  very pleased with how things are standing at this point. Patient likewise is happy that this is doing better. 09/19/17 on evaluation today patient actually appears to be doing rather well in regard to his left lateral lower extremity ulcer. 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 He's not having any significant pain. 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 He has been tolerating the Santyl without complication and in general I feel like he's making good progress. 10/17/17 on evaluation today patient  appears to be doing very well in regard to his left lateral lower surety ulcer. He has been tolerating the dressing changes without complication. There does not appear to be any evidence of infection he's alternating the Santyl and the triple antibiotic ointment every other day this seems to be doing well for him. 11/03/17 on evaluation today patient appears to be doing very well in regard to his left lateral lower extremity ulcer. He is been tolerating the dressing changes without complication which is good news. Fortunately there does not appear to be any evidence of infection which is also great news. Overall is doing excellent they are starting to taper down on the prednisone is down to 40 mg at this point it also started topical clobetasol for him. 11/17/17 on evaluation today patient appears to be doing well in regard to his left lateral lower surety ulcer. He's been tolerating the dressing changes without complication. He does note that he is having no pain, no excessive drainage or discharge, and overall he feels like things are going about how he would expect and hope they would. Overall he seems to have no evidence of infection at this time in my opinion which is good news. 12/04/17-He is seen in follow-up  evaluation for right lateral lower extremity ulcer. He has been applying topical steroid cream. Today's measurement show slight increase in size. Over the next 2 weeks we will transition to every other day Santyl and steroid cream. He has been encouraged to monitor for changes and notify clinic with any concerns 12/15/17 on evaluation today patient's left lateral motion the ulcer and fortunately is doing worse again at this point. This just since last week to this week has close to doubled in size according to the patient. I did not seeing last week's I do not have a visual to compare this to in our system was also down so we do not have all the charts and at this point. Nonetheless it does have me somewhat concerned in regard to the fact that again he was worried enough about it he has contact the dermatology that placed them back on the full strength, 50 mg a day of the prednisone that he was taken previous. He continues to alternate using clobetasol along with Santyl at this point. He is obviously somewhat frustrated. 12/22/17 on evaluation today patient appears to be doing a little worse compared to last evaluation. Unfortunately the wound is a little deeper and slightly larger than the last week's evaluation. With that being said he has made some progress in regard to the irritation surrounding at this time unfortunately despite that progress that's been made he still has a significant issue going on here. 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 [name], [name] ([id number]) For that reason I discussed with him today the possibility of perform a wound culture to ensure there's nothing overtly infected. 01/06/18 on evaluation today patient's wound is larger and deeper than previously evaluated. With that being said it did appear that his wound was  infected after my last evaluation with him. Subsequently I did end up prescribing a prescription for Bactrim DS which she has been taking and having no complication with. Fortunately there does not appear to be any evidence of infection at this point in time as far as anything spreading, no want to touch, and overall I feel like  things are showing signs of improvement. 01/13/18 on evaluation today patient appears to be even a little larger and deeper than last time. There still muscle exposed in the base of the wound. Nonetheless he does appear to be less erythematous I do believe inflammation is calming down also believe the infection looks like it's probably resolved at this time based on what I'm seeing. No fevers, chills, nausea, or vomiting noted at this time. 01/30/18 on evaluation today patient actually appears to visually look better for the most part. Unfortunately those visually this looks better he does seem to potentially have what may be an abscess in the muscle that has been noted in the central portion of the wound. This is the first time that I have noted what appears to be fluctuance in the central portion of the muscle. With that being said I'm somewhat more concerned about the fact that this might indicate an abscess formation at this location. I do believe that an ultrasound would be appropriate. This is likely something we need to try to do as soon as possible. He has been switch to mupirocin ointment and he is no longer using the steroid ointment as prescribed by dermatology he sees them again next week he's been decreased from 60 to 40 mg of prednisone. 03/09/18 on evaluation today patient actually appears to be doing a little better compared to last time I saw him. There's not as much erythema surrounding the wound itself. He I did review his most recent infectious disease note which was dated 02/24/18. He saw Dr. Michel Bickers in [place]. 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 Patient is now off of antibiotics and they are continually observing this. There seems to be no change in the past two weeks in my pinion based on what the patient says and what I see today compared to what Dr. Megan Salon likely saw two weeks ago. No fevers, chills, nausea, or vomiting noted at this time. 03/23/18 on evaluation today patient's wound actually appears to be showing signs of improvement which is good news. He is currently still on the Dapsone. He is also working on tapering the prednisone to get off of this and Dr. Lottie Rater is working with him in this regard. Nonetheless overall I feel like the wound is doing well it does appear based on the infectious disease note that I reviewed from Dr. 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 I did also review the results of his ultrasound of left lower extremity which revealed there was a dentist tissue in the base of the wound without an abscess noted. 04/06/18 on evaluation today the patient's left lateral lower extremity ulcer actually appears to be doing fairly well which is excellent news. There does not appear to be any evidence of infection at this time which is also great news. Overall he still does have a significantly large ulceration although little by little he seems to be making progress. He is down to 10 mg a day of the prednisone. 04/20/18 on evaluation today patient actually appears to be doing excellent at this time in regard to his left lower extremity ulcer. He's making signs of good progress unfortunately this is taking much longer than we would really like to see but nonetheless he is  making progress. Fortunately there does not appear to be any evidence of infection at this time. No fevers, chills, nausea, or vomiting noted at  this time. The patient has not been using the Santyl due to the cost he hadn't got in this field yet. He's mainly been using the antibiotic ointment topically. Subsequently he also tells me that he really has not been scrubbing in the shower I think this would be helpful again as I told him it doesn't have to be anything too aggressive to even make it believe just enough to keep it free of some of the loose slough and biofilm on the wound surface. 05/11/18 on evaluation today patient's wound appears to be making slow but sure progress in regard to the left lateral lower extremity ulcer. He is been tolerating the dressing changes without complication. Fortunately there does not appear to be any evidence of infection at this time. He is still just using triple antibiotic ointment along with clobetasol occasionally over the area. He never got the Santyl and really does not seem to intend to in my pinion. 06/01/18 on evaluation today patient appears to be doing a little better in regard to his left lateral lower extremity ulcer. He states that overall he does not feel like he is doing as well with the Dapsone as he did with the prednisone. Nonetheless he sees his dermatologist later today and is gonna talk to them about the possibility of going back on the prednisone. Overall again I believe that the wound would be better if you would utilize Santyl but he really does not seem to be interested in going back to the [place] at this point. He has been using triple antibiotic ointment. 06/15/18 on evaluation today patient's wound actually appears to be doing about the same at this point. Fortunately there is no signs of infection at this time. He has made slight improvements although he continues to not really want to clean the wound bed at this point. He states that he just doesn't mess with it he doesn't want to cause any problems with everything else he has going on. He has been on medication, antibiotics  as prescribed by his dermatologist, for a staff infection of his lower extremities which is really drying out now and looking much better he tells me. Fortunately there is no sign of overall infection. 06/29/18 on evaluation today patient appears to be doing well in regard to his left lateral lower surety ulcer all things considering. Fortunately his staff infection seems to be greatly improved compared to previous. He has no signs of infection and this is drying up quite nicely. He is still the doxycycline for this is no longer on cental, Dapsone, or any of the other medications. His dermatologist has recommended possibility of an infusion but right now he does not want to proceed with that. 07/13/18 on evaluation today patient appears to be doing about the same in regard to his left lateral lower surety ulcer. Fortunately there's no signs of infection at this time which is great news. Unfortunately he still builds up a significant amount of Slough/biofilm of the surface of the wound he still is not really cleaning this as he should be appropriately. Again I'm able to easily with saline and gauze remove the majority of this on the surface which if you would do this at home would likely be a dramatic improvement for him as far as getting the area to improve. Nonetheless overall I still feel like he  is making progress is just very slow. I think Santyl will be of benefit for him as well. Still he has not gotten this as of this point. 07/27/18 on evaluation today patient actually appears to be doing little worse in regards of the erythema around the periwound region of the wound he also tells me that he's been having more drainage currently compared to what he was experiencing last time I saw him. He states not quite as bad as what he had because this was infected previously but nonetheless is still appears to be doing poorly. Fortunately there is no evidence of systemic infection at this point. The patient  tells me that he is not going to be able to afford the Santyl. He is still waiting to hear about the infusion therapy with his dermatologist. Apparently she wants an updated colonoscopy first. 08/10/18 on evaluation today patient appears to be doing better in regard to his left lateral lower extremity ulcer. Fortunately he is showing signs of improvement in this regard he's actually been approved for Remicade infusion's as well although this has not been scheduled as of yet. Fortunately there's no signs of active infection at this time in regard to the wound although he is having some issues with infection of the right lower extremity is been seen as dermatologist for this. Fortunately they are definitely still working with him trying to keep things under control. [name], [name] ([id number]) 09/07/18 on evaluation today patient is actually doing rather well in regard to his left lateral lower extremity ulcer. He notes these actually having some hair grow back on his extremity which is something he has not seen in years. He also tells me that the pain is really not giving them any trouble at this time which is also good news overall she is very pleased with the progress he's using a combination of the mupirocin along with the probate is all mixed. 09/21/18 on evaluation today patient actually appears to be doing fairly well all things considered in regard to his looks from the ulcer. He's been tolerating the dressing changes without complication. Fortunately there's no signs of active infection at this time which is good news he is still on all antibiotics or prevention of the staff infection. He has been on prednisone for time although he states it is gonna contact his dermatologist and see if she put them on a short course due to some irritation that he has going on currently. Fortunately there's no evidence of any overall worsening this is going very slow I think cental would be something that would be  helpful for him although he states that $50 for tube is quite expensive. He therefore is not willing to get that at this point. 10/06/18 on evaluation today patient actually appears to be doing decently well in regard to his left lateral leg ulcer. He's been tolerating the dressing changes without complication. Fortunately there's no signs of active infection at this time. Overall I'm actually rather pleased with the progress he's making although it's slow he doesn't show any signs of infection and he does seem to be making some improvement. I do believe that he may need a switch up and dressings to try to help this to heal more appropriately and quickly. 10/19/18 on evaluation today patient actually appears to be doing better in regard to his left lateral lower extremity ulcer. This is shown signs of having much less Slough buildup at this point due to the fact he has been using  the Santyl. Obviously this is very good news. The overall size of the wound is not dramatically smaller but again the appearance is. 11/02/18 on evaluation today patient actually appears to be doing quite well in regard to his lower [place] ulcer. A lot of the skin around the ulcer is actually somewhat irritating at this point this seems to be more due to the dressing causing irritation from the adhesive that anything else. Fortunately there is no signs of active infection at this time. 11/24/18 on evaluation today patient appears to be doing a little worse in regard to his overall appearance of his lower extremity ulcer. There's more erythema and warmth around the wound unfortunately. He is currently on doxycycline which he has been on for some time. With that being said I'm not sure that seems to be helping with what appears to possibly be an acute cellulitis with regard to his left lower extremity ulcer. No fevers, chills, nausea, or vomiting noted at this time. 12/08/18 on evaluation today patient's wounds actually appears to  be doing significantly better compared to his last evaluation. He has been using Santyl along with alternating tripling about appointment as well as the steroid cream seems to be doing quite well and the wound is showing signs of improvement which is excellent news. Fortunately there's no evidence of infection and in fact his culture came back negative with only normal skin flora noted. 12/21/2018 upon evaluation today patient actually appears to be doing excellent with regard to his ulcer. This is actually the best that I have seen it since have been helping to take care of him. It is both smaller as well as less slough noted on the surface of the wound and seems to be showing signs of good improvement with new skin growing from the edges. He has been using just the triamcinolone he does wonder if he can get a refill of that ointment today. 01/04/2019 upon evaluation today patient actually appears to be doing well with regard to his left lateral lower extremity ulcer. With that being said it does not appear to be that he is doing quite as well as last time as far as progression is concerned. There does not appear to be any signs of infection or significant irritation which is good news. With that being said I do believe that he may benefit from switching to a collagen based dressing based on how clean The wound appears. 01/18/2019 on evaluation today patient actually appears to be doing well with regard to his wound on the left lower extremity. He is not made a lot of progress compared to where we were previous but nonetheless does seem to be doing okay at this time which is good news. There is no signs of active infection which is also good news. My only concern currently is I do wish we can get him into utilizing the collagen dressing his insurance would not pay for the supplies that we ordered although it appears that he may be able to order this through his supply company that he typically utilizes.  This is [employer]. Nonetheless he did try to order it during the office visit today and it appears this did go through. We will see if he can get that it is a different brand but nonetheless he has collagen and I do think will be beneficial. 02/01/2019 on evaluation today patient actually appears to be doing a little worse today in regard to the overall size of his wounds. Fortunately there  is no signs of active infection at this time. That is visually. Nonetheless when this is happened before it was due to infection. For that reason were somewhat concerned about that this time as well. 02/08/2019 on evaluation today patient unfortunately appears to be doing slightly worse with regard to his wound upon evaluation today. Is measuring a little deeper and a little larger unfortunately. I am not really sure exactly what is causing this to enlarge he actually did see his dermatologist she is going to see about initiating Humira for him. Subsequently she also did do steroid injections into the wound itself in the periphery. Nonetheless still nonetheless he seems to be getting a little bit larger he is gone back to just using the steroid cream topically which I think is appropriate. I would say hold off on the collagen for the time being is definitely a good thing to do. Based on the culture results which we finally did get the final result back regarding it shows staph as the bacteria noted again that can be a normal skin bacteria based on the fact however he is having increased drainage and worsening of the wound measurement wise I would go ahead and place him on an antibiotic today I do believe for this. 02/15/2019 on evaluation today patient actually appears to be doing somewhat better in regard to his ulcer. There is no signs of worsening at this time I did review his culture results which showed evidence of Staphylococcus aureus but not MRSA. Again this could just be more related to the normal skin  bacteria although he states the drainage has slowed down quite a bit he may have had a mild infection not just colonization. And was much smaller and then since around10/04/2019 on evaluation today patient appears to be doing unfortunately worse as far as the size of the wound. I really feel like that this is steadily getting larger again it had been doing excellent right at the beginning of September we have seen a steady increase in the area of the wound it is almost 2-1/2 times the size it was on September 1. Obviously this is a bad trend this is not wanting to see. For that reason we went back to using just the topical triamcinolone cream which does seem to help with inflammation. I checked him for bacteria by way of culture and nothing showed positive there. I am considering giving him a short course of a tapering steroid Dosepak today to see if that is can be beneficial for him. The patient is in agreement with giving that a try. 03/08/2019 on evaluation today patient appears to be doing very well in comparison to last evaluation with regard to his lower extremity ulcer. This is showing signs of less inflammation and actually measuring slightly smaller compared to last time every other week over the past month and a half he has been measuring larger larger larger. Nonetheless I do believe that the issue has been inflammation the prednisone does seem to [facility] ([id number]) have been beneficial for him which is good news. No fevers, chills, nausea, vomiting, or diarrhea. 03/22/2019 on evaluation today patient appears to be doing about the same with regard to his leg ulcer. He has been tolerating the dressing changes without complication. With that being said the wound seems to be mostly arrested at its current size but really is not making any progress except for when we prescribed the prednisone. He did show some signs of dropping as far as  the overall size of the wound during that interval  week. Nonetheless this is something he is not on long-term at this point and unfortunately I think he is getting need either this or else the Humira which his dermatologist has discussed try to get approval for. With that being said he will be seeing his dermatologist on the 11th of this month that is November. 04/19/2019 on evaluation today patient appears to be doing really about the same the wound is measuring slightly larger compared to last time I saw him. He has not been into the office since November 2 due to the fact that he unfortunately had Covid as that his entire family. He tells me that it was rough but they did pull-through and he seems to be doing much better. Fortunately there is no signs of active infection at this time. No fevers, chills, nausea, vomiting, or diarrhea. 05/10/2019 on evaluation today patient unfortunately appears to be doing significantly worse as compared to last time I saw him. He does tell me that he has had his first dose of Humira and actually is scheduled to get the next one in the upcoming week. With that being said he tells me also that in the past several days he has been having a lot of issues with green drainage she showed me a picture this is more blue-green in color. He is also been having issues with increased sloughy buildup and the wound does appear to be larger today. Obviously this is not the direction that we want everything to take based on the starting of his Humira. Nonetheless I think this is definitely a result of likely infection and to be honest I think this is probably Pseudomonas causing the infection based on what I am seeing. 05/24/2019 on evaluation today patient unfortunately appears to be doing significantly worse compared to his prior evaluation with me 2 weeks ago. I did review his culture results which showed that he does have Staph aureus as well as Pseudomonas noted on the culture. Nonetheless the Levaquin that I prescribed for him  does not appear to have been appropriate and in fact he tells me he is no longer experiencing the green drainage and discharge that he had at the last visit. Fortunately there is no signs of active infection at this time which is good news although the wound has significantly worsened it in fact is much deeper than it was previous. We have been utilizing up to this point triamcinolone ointment as the prescription topical of choice but at this time I really feel like that the wound is getting need to be packed in order to appropriately manage this due to the deeper nature of the wound. Therefore something along the lines of an alginate dressing may be more appropriate. 05/31/2019 upon inspection today patient's wound actually showed signs of doing poorly at this point. Unfortunately he just does not seem to be making any good progress despite what we have tried. He actually did go ahead and pick up the Cipro and start taking that as he was noticing more green drainage he had previously completed the Levaquin that I prescribed for him as well. Nonetheless he missed his appointment for the seventh last week on Wednesday with the wound care center and [facility] where his dermatologist referred him. Obviously I do think a second opinion would be helpful at this point especially in light of the fact that the patient seems to be doing so poorly despite the fact  that we have tried everything that I really know how at this point. The only thing that ever seems to have helped him in the past is when he was on high doses of continual steroids that did seem to make a difference for him. 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 06/07/2019 upon evaluation today patient unfortunately appears to be doing worse yet again with regard to his wound. In fact I  am starting to question whether or not he may have a fluid pocket in the muscle at this point based on the bulging and the soft appearance to the central portion of the muscle area. There is not anything draining from the muscle itself at this time which is good news but nonetheless the wound is expanding. I am not really seeing any results of the Humira as far as overall wound progression based on what I am seeing at this point. The patient has been referred for second opinion with regard to his wound to the [facility] wound care center by his dermatologist which I definitely am not in opposition to. Unfortunately we tried multiple dressings in the past including collagen, alginate, and at one point even Hydrofera Blue. With that being said he is never really used it for any significant amount of time due to the fact that he often complains of pain associated with these dressings and then will go back to either using the Santyl which she has done intermittently or more frequently the triamcinolone. He is also using his own compression stockings. We have wrapped him in the past but again that was something else that he really was not a big fan of. 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 He seemed to do much better when he was on the steroids although honestly I understand that the steroids are not necessarily the best medication to be on long-term obviously 06/14/2019 on evaluation today patient appears to be doing actually a little bit better with regard to the overall appearance with his leg. Unfortunately he does continue to have issues with what appears to be some fluid underneath  the muscle although he did see the wound specialty center at [facility] last week their main goals were to see about infusion therapy in place of the Humira as they feel like that is not quite strong enough. They also recommended that we continue with the treatment otherwise as we are they felt like that was appropriate and they are okay with him continuing to follow-up here with [place] in that regard. With that being said they are also sending him to the vein specialist there to see about vein stripping and if that would be of benefit for him. Subsequently they also did not really address whether or not an ultrasound of the muscle area to see if there is anything that needs to be addressed here would be appropriate or not. For that reason I discussed this with him last week I think we  may proceed down that road at this point. 06/21/2019 upon evaluation today patient's wound actually appears to be doing slightly better compared to previous evaluations. I do believe that he has made a difference with regard to the progression here with the use of oral steroids. Again in the past has been the only thing that is really calm things down. He does tell me that from [facility] is gotten a good news from there that there are no further vein stripping that is necessary at this point. I do not have that available for review today although the patient did relay this to me. He also did obtain and have the ultrasound of the wound completed which I did sign off on today. It does appear that there is no fluid collection under the muscle this is likely then just edematous tissue in general. That is also good news. Overall I still believe the inflammation is the main issue here. He did inquire about the possibility of a wound VAC again with the muscle protruding like it is I am not really sure whether the wound VAC is necessarily ideal or not. That is something we will have to consider although I do believe he may need compression  wrapping to try to help with edema control which could potentially be of benefit. 06/28/2019 on evaluation today patient appears to be doing slightly better measurement wise although this is not terribly smaller he least seems to be trending towards that direction. With that being said he still seems to have purulent drainage noted in the wound bed at this time. He has been on Levaquin followed by Cipro over the past month. Unfortunately he still seems to have some issues with active infection at this time. I did perform a culture last week in order to evaluate and see if indeed there was still anything going on. Subsequently the culture did come back showing Pseudomonas which is consistent with the drainage has been having which is blue-green in color. He also has had an odor that again was somewhat consistent with Pseudomonas as well. Long story short it appears that the culture showed an intermediate finding with regard to how well the Cipro will work for the Pseudomonas infection. Subsequently being that he does not seem to be clearing up and at best what we are doing is just keeping this at [place] I think he may need to see infectious disease to discuss IV antibiotic options. [name], [name] ([id number]) 07/05/2019 upon evaluation today patient appears to be doing okay in regard to his leg ulcer. He has been tolerating the dressing changes at this point without complication. Fortunately there is no signs of active infection at this time which is good news. No fevers, chills, nausea, vomiting, or diarrhea. With that being said he does have an appointment with infectious disease tomorrow and his primary care on Wednesday. Again the reason for the infectious disease referral was due to the fact that he did not seem to be fully resolving with the use of oral antibiotics and therefore we were thinking that IV antibiotic therapy may be necessary secondary to the fact that there was an intermediate finding for  how effective the Cipro may be. Nonetheless again he has been having a lot of purulent and even green drainage. Fortunately right now that seems to have calmed down over the past week with the reinitiation of the oral antibiotic. Nonetheless we will see what Dr. Megan Salon has to say. 07/12/2019 upon evaluation today patient appears to be  doing about the same at this point in regard to his left lower extremity ulcer. Fortunately there is no signs of active infection at this time which is good news I do believe the Levaquin has been beneficial I did review Dr. Hale Bogus note and to be honest I agree that the patient's leg does appear to be doing better currently. What we found in the past as he does not seem to really completely resolve he will stop the antibiotic and then subsequently things will revert back to having issues with blue-green drainage, increased pain, and overall worsening in general. Obviously that is the reason I sent him back to infectious disease. 07/19/2019 upon evaluation today patient appears to be doing roughly the same in size there is really no dramatic improvement. He has started back on the Levaquin at this point and though he seems to be doing okay he did still have a lot of blue/green drainage noted on evaluation today unfortunately. I think that this is still indicative more likely of a Pseudomonas infection as previously noted and again he does see Dr. Megan Salon in just a couple of days. I do not know that were really able to effectively clear this with just oral antibiotics alone based on what I am seeing currently. Nonetheless we are still continue to try to manage as best we can with regard to the patient and his wound. I do think the wrap was helpful in decreasing the edema which is excellent news. No fevers, chills, nausea, vomiting, or diarrhea. 07/26/2019 upon evaluation today patient appears to be doing slightly better with regard to the overall appearance of the muscle  there is no dark discoloration centrally. Fortunately there is no signs of active infection at this time. No fevers, chills, nausea, vomiting, or diarrhea. Patient's wound bed currently the patient did have an appointment with Dr. Megan Salon at infectious disease last week. With that being said Dr. Megan Salon the patient states was still somewhat hesitant about put him on any IV antibiotics he wanted [place] to repeat cultures today and then see where things go going forward. He does look like Dr. Megan Salon because of some improvement the patient did have with the Levaquin wanted [place] to see about repeating cultures. If it indeed grows the Pseudomonas again then he recommended a possibility of considering a PICC line placement and IV antibiotic therapy. He plans to see the patient back in 1 to 2 weeks. 08/02/2019 upon evaluation today patient appears to be doing poorly with regard to his left lower extremity. We did get the results of his culture back it shows that he is still showing evidence of Pseudomonas which is consistent with the purulent/blue-green drainage that he has currently. Subsequently the culture also shows that he now is showing resistance to the oral fluoroquinolones which is unfortunate as that was really the only thing to treat the infection prior. I do believe that he is looking like this is going require IV antibiotic therapy to get this under control. Fortunately there is no signs of systemic infection at this time which is good news. The patient does see Dr. Megan Salon tomorrow. 08/09/2019 upon evaluation today patient appears to be doing better with regard to his left lower extremity ulcer in regard to the overall appearance. He is currently on IV antibiotic therapy. As ordered by Dr. Megan Salon. Currently the patient is on ceftazidime which she is going to take for the next 2 weeks and then follow-up for 4 to 5-week appointment with Dr. Megan Salon.  The patient started this this past Friday  symptoms have not for a total of 3 days currently in full. 08/16/2019 upon evaluation today patient's wound actually does show muscle in the base of the wound but in general does appear to be much better as far as the overall evidence of infection is concerned. In fact I feel like this is for the most part cleared up he still on the IV antibiotics he has not completed the full course yet but I think he is doing much better which is excellent news. 08/23/2019 upon evaluation today patient appears to be doing about the same with regard to his wound at this point. He tells me that he still has pain unfortunately. Fortunately there is no evidence of systemic infection at this time which is great news. There is significant muscle protrusion. 09/13/19 upon evaluation today patient appears to be doing about the same in regard to his leg unfortunately. He still has a lot of drainage coming from the ulceration there is still muscle exposed. With that being said the patient's last wound culture still showed an intermediate finding with regard to the Pseudomonas he still having the bluish/green drainage as well. Overall I do not know that the wound has completely cleared of infection at this point. Fortunately there is no signs of active infection systemically at this point which is good news. 09/20/2019 upon evaluation today patient's wound actually appears to be doing about the same based on what I am seeing currently. I do not see any signs of systemic infection he still does have evidence of some local infection and drainage. He did see Dr. Megan Salon last week and Dr. Megan Salon states that he probably does need a different IV antibiotic although he does not want to put him on this until the patient begins the Remicade infusion which is actually scheduled for about 10 days out from today on 13 May. Following that time Dr. Megan Salon is good to see him back and then will evaluate the feasibility of starting him on the  IV antibiotic therapy once again at that point. I do not disagree with this plan I do believe as Dr. Megan Salon stated in his note that I reviewed today that the patient's issue is multifactorial with the pyoderma being 1 aspect of this that were hoping the Remicade will be helpful for her. In the meantime I think the gentamicin is, helping to keep things under decent okay control in regard to the ulcer. 09/27/2019 upon evaluation today patient appears to be doing about the same with regard to his wound still there is a lot of muscle exposure though he does have some hyper granulation tissue noted around the edge and actually some granulation tissue starting to form over the muscle which is actually good news. Fortunately there is no evidence of active infection which is also good news. His pain is less at this point. 5/21; this is a patient I have not seen in a long time. 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 He has a large wound on the left lateral leg with protruding muscle. He comes in the clinic today showing the same area on his left medial ankle. He says there is been a spot there for some time although we have not previously defined this. Today he has a clearly defined area with slight amount of skin breakdown surrounded by raised areas with a purplish hue in color. This is not painful he says it is irritated.  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 He still has muscle protruding from the lateral portion of his left leg but fortunately the new area that they were concerned about at his last visit does not appear to have opened at this point. He is currently on Remicade infusions and seems to be doing better in my opinion in fact the wound itself seems to be overall much better. The purplish discoloration that he did have seems to have resolved  and I think that is a good sign that hopefully the Remicade is doing its job. He does have some biofilm noted over the surface of the wound. 11/01/2019 on evaluation today patient's wound actually appears to be doing excellent at this time. Fortunately there is no evidence of active infection and overall I feel like he is making great progress. The Remicade seems to be due excellent job in my opinion. TRENTON, PASSOW ([id number]) 11/08/19 evaluation today vision actually appears to be doing quite well with regard to his weight ulcer. He's been tolerating dressing changes without complication. Fortunately there is no evidence of infection. No fevers, chills, nausea, or vomiting noted at this time. Overall states that is having more itching than pain which is actually a good sign in my opinion. 12/13/2019 upon evaluation today patient appears to be doing well today with regard to his wound. He has been tolerating the dressing changes without complication. Fortunately there is no sign of active infection at this time. No fevers, chills, nausea, vomiting, or diarrhea. Overall I feel like the infusion therapy has been very beneficial for him. 01/06/2020 on evaluation today patient appears to be doing well with regard to his wound. This is measuring smaller and actually looks to be doing better. Fortunately there is no signs of active infection at this point. No fevers, chills, nausea, vomiting, or diarrhea. With that being said he does still have the blue-green drainage but this does not seem to be causing any significant issues currently. He has been using the gentamicin that does seem to be keeping things under decent control at this point. He goes later this morning for his next infusion therapy for the pyoderma which seems to also be very beneficial. 02/07/2020 on evaluation today patient appears to be doing about the same in regard to his wounds currently. Fortunately there is no signs of active infection  systemically he does still have evidence of local infection still using gentamicin. He also is showing some signs of improvement albeit slowly I do feel like we are making some progress here. 02/21/2020 upon evaluation today patient appears to be making some signs of improvement the wound is measuring a little bit smaller which is great news and overall I am very pleased with where he stands currently. He is going to be having infusion therapy treatment on the 15th of this month. Fortunately there is no signs of active infection at this time. 03/13/2020 I do believe patient's wound is actually showing some signs of improvement here which is great news. He has continue with the infusion therapy through rheumatology/dermatology at [facility]. That does seem to be beneficial. I still think he gets as much benefit from this as he did from the prednisone initially but nonetheless obviously this is less harsh on his body that the prednisone as far as they are concerned. 03/31/2020 on evaluation today patient's wound actually showing signs of some pretty good improvement in regard  to the overall appearance of the wound bed. There is still muscle exposed though he does have some epithelial growth around the edges of the wound. Fortunately there is no signs of active infection at this time. No fevers, chills, nausea, vomiting, or diarrhea. 04/24/2020 upon evaluation today patient appears to be doing about the same in regard to his leg ulcer. He has been tolerating the dressing changes without complication. Fortunately there is no signs of active infection at this time. No fevers, chills, nausea, vomiting, or diarrhea. With that being said he still has a lot of irritation from the bandaging around the edges of the wound. We did discuss today the possibility of a referral to plastic surgery. 05/22/2020 on evaluation today patient appears to be doing well with regard to his wounds all things considered. He has not been able  to get the Chantix apparently there is a recall nurse that I was unaware of put out by [employer] involuntarily. Nonetheless for now I am and I have to do some research into what may be the best option for him to help with quitting in regard to smoking and we discussed that today. 06/26/2020 upon evaluation today patient appears to be doing well with regard to his wound from the standpoint of infection I do not see any signs of infection at this point. With that being said unfortunately he is still continuing to have issues with muscle exposure and again he is not having a whole lot of new skin growth unfortunately. There does not appear to be any signs of active infection at this time. No fevers, chills, nausea, vomiting, or diarrhea. 07/10/2020 upon evaluation today patient appears to be doing a little bit more poorly currently compared to where he was previous. I am concerned currently about an active infection that may be getting worse especially in light of the increased size and tenderness of the wound bed. No fevers, chills, nausea, vomiting, or diarrhea. 07/24/2020 upon evaluation today patient appears to be doing poorly in regard to his leg ulcer. He has been tolerating the dressing changes without complication but unfortunately is having a lot of discomfort. 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 Subsequently I think he is going require possibly IV antibiotics to get this under control. I am very concerned about the severity of his infection and the amount of discomfort he is having. 07/31/2020 upon evaluation today patient appears to be doing about the same in regard to his leg wound. He did see Dr. Megan Salon and Dr. Megan Salon is actually going to start him on IV antibiotics. He goes for the PICC line tomorrow. With that being said there do not have that run for 2 weeks and then see how things are doing and depending on how he  is progressing they may extend that a little longer. Nonetheless I am glad this is getting ready to be in place and definitely feel it may help the patient. In the meantime is been using mainly triamcinolone to the wound bed has an anti-inflammatory. 08/07/2020 on evaluation today patient appears to be doing well with regard to his wound compared even last week. In the interim he has gotten the PICC line placed and overall this seems to be doing excellent. There does not appear to be any evidence of infection which is great news systemically although locally of course has had the infection this appears to be improving with the use of the antibiotics. 08/14/2020 upon  evaluation today patient's wound actually showing signs of excellent improvement. Overall the irritation has significantly improved the drainage is back down to more of a normal level and his pain is really pretty much nonexistent compared to what it was. Obviously I think that this is significantly improved secondary to the IV antibiotic therapy which has made all the difference in the world. Again he had a resistant form of Pseudomonas for which oral antibiotics just was not cutting it. Nonetheless I do think that still we need to consider the possibility of a surgical closure for this wound is been open so long and to be honest with muscle exposed I think this can be very hard to get this to close outside of this although definitely were still working to try to do what we can in that regard. 08/21/2020 upon evaluation today patient appears to be doing very well with regard to his wounds on the left lateral lower extremity/calf area. Fortunately there does not appear to be signs of active infection which is great news and overall very pleased with where things stand today. He is actually wrapping up his treatment with IV antibiotics tomorrow. After that we will see where things go from there. 08/28/2020 upon evaluation today patient appears  to be doing decently well with regard to his leg ulcer. There does not appear to be any signs of active infection which is great news and overall very pleased with where things stand today. No fevers, chills, nausea, vomiting, or diarrhea. 09/18/2020 upon evaluation today patient appears to be doing well with regard to his infection which I feel like is better. Unfortunately he is not doing as well with regard to the overall size of the wound which is not nearly as good at this point. I feel like that he may be having an issue here with the pyoderma being somewhat out of control. I think that he may benefit from potentially going back and talking to the dermatologist about JAROME, TRULL ([id number]) what to do from the pyoderma standpoint. I am not certain if the infusions are helping nearly as much is what the prednisone did in the past. 10/02/2020 upon evaluation today patient appears to be doing well with regard to his leg ulcer. He did go to the [facility] nurse. Unfortunately they feel like there is a 10% chance that most that he would be able to heal and that the skin graft would take. Obviously this has led him to not be able to go down that path as far as treatment is concerned. Nonetheless he does seem to be doing a little bit better with the prednisone that I gave him last time. I think that he may need to discuss with dermatology the possibility of long-term prednisone as that seems to be what is most helpful for him to be perfectly honest. I am not sure the Remicade is really doing the job. 10/17/2020 upon evaluation today patient appears to be doing a little better in regard to his wound. In fact the case has been since we did the prednisone on May 2 for him that we have noticed a little bit of improvement each time we have seen a size wise as well as appearance wise as well as pain wise. I think the prednisone has had a greater effect then the infusion therapy has to be perfectly honest. With  that being said the patient also feels significantly better compared to what he was previous. All of this is good news but  nonetheless I am still concerned about the fact that again we are really not set up to long-term manage him as far as prednisone is concerned. Obviously there are things that you need to be watched I completely understand the risk of prednisone usage as well. 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 With all that being said I do believe that we can give him another round of the prednisone which she is requesting today because of the improvement that he seen since we did that first round. 10/30/2020 upon evaluation today patient's wound actually is showing signs of doing quite well. There does not appear to be any evidence of infection which is great news and overall very pleased with where things stand today. No fevers, chills, nausea, vomiting, or diarrhea. He tells me that the prednisone still has seem to have helped he wonders if we can extend that for just a little bit longer. He did not have the appointment with a dermatologist although he did have an infusion appointment last Friday. That was at [facility]. With that being said he tells me he could not do both that as well as the appointment with the physician on the same day therefore that is can have to be rescheduled. I really want to see if there is anything they feel like that could be done differently to try to help this out as I am not really certain that the infusions are helping significantly here. 11/13/2020 upon evaluation today patient unfortunately appears to be doing somewhat poorly in regard to his wound I feel like this is actually worsening from the standpoint of the pyoderma spreading. I still feel like that he may need something different as far as trying to manage this going forward. Again we did the prednisone unfortunately his blood sugars are not doing so well  because of this. Nonetheless I believe that the patient likely needs to try topical steroid. We have done triamcinolone for a while I think going with something stronger such as clobetasol could be beneficial again this is not something I do lightly I discussed this with the patient that again this does not normally put underneath an occlusive dressing. Nonetheless I think a thin film as such could help with some of the stronger anti-inflammatory effects. We discussed this today. He would like to try to give this a trial for the next couple weeks. I definitely think that is something that we can do. Evaluate7/03/2021 and today patient's wound bed actually showed signs of doing really about the same. There was a little expansion of the size of the wound and that leading edge that we done looking out although the clobetasol does seem to have slowed this down a bit in my opinion. There is just 1 small area that still seems to be progressing based on what I see. Nonetheless I am concerned about the fact this does not seem to be improving if anything seems to be doing a little bit worse. I do not know that the infusions are really helping him much as next infusion is August 5 his appointment with dermatology is July 25. Either way I really think that we need to have a conversation potentially about this and I am actually going to see if I can talk with Dr. Lillia Carmel in order to see where things stand as well. 12/11/2020 upon evaluation today patient appears to be doing worse in regard to his leg ulcer. Unfortunately  I just do not think this is making the progress that I would like to see at this point. Honestly he does have an appointment with dermatology and this is in 2 days. I am wondering what they may have to offer to help with this. Right now what I am seeing is that he is continuing to show signs of worsening little by little. Obviously that is not great at all. Is the exact opposite of what we are  looking for. 12/18/2020 upon evaluation today patient appears to be doing a little better in regard to his wound. The dermatologist actually did do some steroid injections into the wound which does seem to have been beneficial in my opinion. That was on the 25th already this looks a little better to me than last time I saw him. With that being said we did do a culture and this did show that he has Staph aureus noted in abundance in the wound. With that being said I do think that getting him on an oral antibiotic would be appropriate as well. Also think we can compression wrap and this will make a difference as well. 12/28/2020 upon evaluation today patient's wound is actually showing signs of doing much better. I do believe the compression wrap is helping he has a lot of drainage but to be honest I think that the compression is helping to some degree in this regard as well as not draining through which is also good news. No fevers, chills, nausea, vomiting, or diarrhea. 01/04/2021 upon evaluation today patient appears to be doing well with regard to his wound. Overall things seem to be doing quite well. He did have a little bit of reaction to the CarboFlex Sorbact he will be using that any longer. With that being said he is controlled as far as the drainage is concerned overall and seems to be doing quite well. I do not see any signs of active infection at this time which is great news. No fevers, chills, nausea, vomiting, or diarrhea. 01/11/2021 upon evaluation today patient appears to be doing well with regard to his wounds. He has been tolerating the dressing changes without complication. Fortunately there does not appear to be any signs of active infection at this time which is great news. Overall I am extremely pleased with where we stand currently. No fevers, chills, nausea, vomiting, or diarrhea. Where using clobetasol in the wound bed he has a lot of new skin growth which is awesome as  well. 01/18/2021 upon evaluation today patient appears to be doing very well in regard to his leg ulcer. He has been tolerating the dressing changes without complication. Fortunately there does not appear to be any signs of active infection which is great news. In general I think that he is making excellent progress 01/25/2021 upon evaluation today patient appears to be doing well with regard to his wound on the leg. I am actually extremely pleased with where things stand today. There does not appear to be any signs of active infection which is great news and overall I think that we are definitely headed in the appropriate direction based on what I am seeing currently. There does not appear to be any signs of active infection also excellent news. 02/06/2021 upon evaluation today patient appears to be doing well with regard to his wound. Overall visually this is showing signs of significant improvement which is great news. I do not see any signs of active infection systemically which is great even locally  I do not think that we are seeing any major complications here. We did do fluorescence imaging with the [employer] today. The patient does have some odor and drainage noted and again this is something that I think would benefit him to probably come more frequently for nurse visits. 02/19/2021 upon evaluation today patient actually appears to be doing quite well in regard to his wound. He has been tolerating the dressing Bougie, Sergi ([id number]) changes without complication and overall I think that this is making excellent progress. I do not see any evidence of active infection at this point which is great news as well. No fevers, chills, nausea, vomiting, or diarrhea. 10/10; wound is made nice progress healthy granulation with a nice rim of epithelialization which seems to be expanding even from last week he has a deeper area in the inferior part of the more distal part of the wound with not quite as  healthy as surface. This area will need to be followed. Using clobetasol and Hydrofera Blue 03/05/2021 upon evaluation today patient appears to be doing very well in regard to his leg ulcer. He has been tolerating dressing changes without complication. Fortunately there does not appear to be any signs of active infection which is great news and overall I am extremely pleased with where we stand currently. 03/12/2021 upon evaluation today patient appears to be doing well with regard to his wound in fact this is extremely extremely good based on what we are seeing today there does not appear to be any signs of active infection and overall I think that he is doing awesome from the standpoint of healing in general. I am extremely pleased with how things seem to be progressing with regard to this pyoderma. Clobetasol has done wonders for him. I think the compression wrapping has also been of great benefit. 03/19/2021 upon evaluation today patient appears to be doing well with regard to his wound. He is tolerating the dressing changes without complication. In fact I feel like that he is actually making excellent progress at this point based on what I am seeing. No fevers, chills, nausea, vomiting, or diarrhea. 03/26/2021 upon evaluation today patient appears to be doing well with regard to his wound. This again is measuring smaller and looking better. Again the progress is slow but nonetheless continual with what we have been seeing. I do believe that the current plan is doing awesome for him. 04/09/2021 upon evaluation today patient appears to be doing well with regard to his leg ulcer. This is showing signs of excellent improvement the muscle is completely closed over and there does not appear to be any evidence of inflammation at this point his drainage is significantly improved. Overall I think that he would be a good candidate for looking into a skin substitute at this point as well. We will get a look  into some approvals in that regard. Potentially TheraSkin as well as Apligraf could both be considered just depending on insurance coverage. 04/16/2021 upon evaluation today patient appears to be doing well at this point. He has been approved for the Apligraf which we could definitely order although I would like to try to get the TheraSkin approved if at all possible. I did fax notes into them today and I Georgina Peer try to give a call as well. Overall the wound appears to be doing decently well today. 04/20/2021 upon evaluation today patient actually appears to be doing quite well in regard to his wound with that being said we  are trying to see what we can do about speeding up the healing process. For that reason we did discuss the possibility of a skin substitute. We got the Apligraf approved. For that reason we will get a go ahead and see what we can do with the Apligraf at this point. I am still trying to get the TheraSkin approved but I have not heard anything from the insurance company yet I have called and talked to them earlier in the week and to be honest this was about half an hour that I spent on the phone they told me I would hear something within 10-15 business days. 04/27/2021 upon evaluation today patient appears to be doing excellent in regard to his wound. I do believe the Apligraf has been beneficial. With that being said he has had a little bit of increased pain has been a little bit concerned. I do want to go ahead and apply his steroid cream today the clobetasol and then put the Apligraf over I just do not think I want to risk not putting the clobetasol on based on what is been going on here currently. Patient voiced understanding. 05/04/2021 upon evaluation today patient is making excellent improvement. Overall there is definitely decrease in the size of the wound today and I am very pleased in that regard. I do not see any signs of active infection locally nor systemically at this  point. No fevers, chills, nausea, vomiting, or diarrhea. 05/11/2021 upon evaluation today patient appears to be doing well with regard to his wound. He has been tolerating Apligraf's without complication and is making excellent progress this is application #4 today. 12/30; patient here for Apligraf application. Appears to be doing well small wound there has been a lot of healing 05/28/2021 upon evaluation today patient appears to be doing well with regard to his wound. Has been tolerating the dressing changes without complication. Fortunately I do not see any signs of active infection locally nor systemically at this point which is great news. He is done with the Apligraf and the first round and overall this has filled in quite nicely I am not even certain he needs any additional at the moment. I would actually try to go back to clobetasol with [facility] which previously was doing well for him. 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 He is using clobetasol and Hydrofera Blue. Surface area of the wound is smaller and the surface looks very healthy. 06/11/2021 upon evaluation today patient actually appears to be showing signs of good improvement which is great news. Fortunately I do not see any signs of active infection at this time which is also excellent news. I do believe that the patient is doing well with the clobetasol and Hydrofera Blue. He does have some irritation and itching around the rest of the leg will use some triamcinolone over this area. 06/18/2021 upon evaluation today patient appears to be doing well with regard to his wound. He is showing signs of excellent improvement and overall I am extremely pleased with where we stand today. We are moving in the right direction. 06/25/2021 upon evaluation today patient appears to be doing well with regard to his wound. In fact this is doing also not showing signs of excellent improvement  overall very pleased with where we stand today. I do not see any evidence of active infection locally or systemically at this time which is also great news. Electronic Signature(s) Signed: 06/25/2021 8:50:41  AM By: Worthy Keeler PA-C Entered By: Worthy Keeler on 06/25/2021 08:50:41 [name], [name] ([id number]) -------------------------------------------------------------------------------- Physical Exam Details Patient Name: [name] Date of Service: 06/25/2021 8:15 AM Medical Record Number: [id number] Patient Account Number: [id number] Date of Birth/Sex: March 08, 1981 (40 y.o. M) Treating RN: Levora Dredge Primary Care Provider: [name] Other Clinician: Referring Provider: [name] Treating Provider/Extender: [name] Cliche in Treatment: 71 Constitutional Well-nourished and well-hydrated in no acute distress. Respiratory normal breathing without difficulty. Psychiatric this patient is able to make decisions and demonstrates good insight into disease process. Alert and Oriented x 3. pleasant and cooperative. Notes He is on doxycycline for respiratory issue. Upon inspection patient's wound bed showed signs of good granulation epithelization at this point. Fortunately I do not see any evidence of infection actively at this point which is great. With that being said the patient does seem to have a couple areas where he has an ingrown hair or irritation. Electronic Signature(s) Signed: 06/25/2021 8:51:26 AM By: Worthy Keeler PA-C Entered By: Worthy Keeler on 06/25/2021 08:51:26 ASHRITH, SAGAN ([id number]) -------------------------------------------------------------------------------- Physician Orders Details Patient Name: [name] Date of Service: 06/25/2021 8:15 AM Medical Record Number: [id number] Patient Account Number: [id number] Date of Birth/Sex: Oct 01, 1980 (40 y.o. M) Treating RN: Levora Dredge Primary Care Provider: [name] Other  Clinician: Referring Provider: [name] Treating Provider/Extender: [name] Cliche in Treatment: [phone] Verbal / Phone Orders: No Diagnosis Coding ICD-10 Coding Code Description I87.2 Venous insufficiency (chronic) (peripheral) L97.222 Non-pressure chronic ulcer of left calf with fat layer exposed E11.622 Type 2 diabetes mellitus with other skin ulcer L88 Pyoderma gangrenosum F17.208 Nicotine dependence, unspecified, with other nicotine-induced disorders Follow-up Appointments o Return Appointment in 1 week. o Nurse Visit as needed - twice a week Bathing/ Shower/ Hygiene o Clean wound with Normal Saline or wound cleanser. o May shower with wound dressing protected with water repellent cover or cast protector. Edema Control - Lymphedema / Segmental Compressive Device / Other o Optional: One layer of unna paste to top of compression wrap (to act as an anchor). o Elevate, Exercise Daily and Avoid Standing for Long Periods of Time. o Elevate legs to the level of the heart and pump ankles as often as possible o Elevate leg(s) parallel to the floor when sitting. Wound Treatment Wound #1 - Lower Leg Wound Laterality: Left, Lateral Cleanser: Wound Cleanser 3 x Per Week/30 Days Discharge Instructions: Wash your hands with soap and water. Remove old dressing, discard into plastic bag and place into trash. Cleanse the wound with Wound Cleanser prior to applying a clean dressing using gauze sponges, not tissues or cotton balls. Do not scrub or use excessive force. Pat dry using gauze sponges, not tissue or cotton balls. Peri-Wound Care: Moisturizing Lotion 3 x Per Week/30 Days Discharge Instructions: Suggestions: Theraderm, Eucerin, Cetaphil, or patient preference. Topical: Clobetasol Propionate ointment 0.05%, 60 (g) tube 3 x Per Week/30 Days Discharge Instructions: Pt to bring to appt- Topical: Triamcinolone Acetonide Cream, 0.1%, 15 (g) tube 3 x Per Week/30  Days Discharge Instructions: Apply as directed by provider. To leg not on wound Primary Dressing: Hydrofera Blue Ready Transfer Foam, 4x5 (in/in) 3 x Per Week/30 Days Discharge Instructions: Apply Hydrofera Blue Ready to wound bed as directed Secondary Dressing: Zetuvit Plus Silicone Non-bordered 5x5 (in/in) 3 x Per Week/30 Days Compression Wrap: Medichoice 4 layer Compression System, 35-40 mmHG (Generic) 3 x Per Week/30 Days Discharge Instructions: Apply multi-layer wrap as directed. Electronic Signature(s) Signed: 06/25/2021 8:58:55 AM By:  Levora Dredge Signed: 06/26/2021 10:02:31 AM By: Worthy Keeler PA-C Entered By: Levora Dredge on 06/25/2021 08:58:54 [name], [name] ([id number]) [name], [name] ([id number]) -------------------------------------------------------------------------------- Problem List Details Patient Name: [name] Date of Service: 06/25/2021 8:15 AM Medical Record Number: [id number] Patient Account Number: [id number] Date of Birth/Sex: 1980/10/04 (40 y.o. M) Treating RN: Levora Dredge Primary Care Provider: [name] Other Clinician: Referring Provider: [name] Treating Provider/Extender: [name] Cliche in Treatment: [phone] Active Problems ICD-10 Encounter Code Description Active Date MDM Diagnosis I87.2 Venous insufficiency (chronic) (peripheral) 12/04/2016 No Yes L97.222 Non-pressure chronic ulcer of left calf with fat layer exposed 12/04/2016 No Yes E11.622 Type 2 diabetes mellitus with other skin ulcer 04/09/2021 No Yes L88 Pyoderma gangrenosum 03/26/2017 No Yes F17.208 Nicotine dependence, unspecified, with other nicotine-induced disorders 04/24/2020 No Yes Inactive Problems ICD-10 Code Description Active Date Inactive Date L97.213 Non-pressure chronic ulcer of right calf with necrosis of muscle 04/02/2017 04/02/2017 Resolved Problems ICD-10 Code Description Active Date Resolved Date L97.321 Non-pressure chronic ulcer of left ankle limited  to breakdown of skin 10/08/2019 10/08/2019 L03.116 Cellulitis of left lower limb 05/24/2019 05/24/2019 Electronic Signature(s) Signed: 06/25/2021 8:20:00 AM By: Worthy Keeler PA-C Entered By: Worthy Keeler on 06/25/2021 08:19:59 [name] ([id number]) -------------------------------------------------------------------------------- Progress Note Details Patient Name: [name] Date of Service: 06/25/2021 8:15 AM Medical Record Number: [id number] Patient Account Number: [id number] Date of Birth/Sex: [birth year] (40 y.o. M) Treating RN: Levora Dredge Primary Care Provider: [name] Other Clinician: Referring Provider: [name] Treating Provider/Extender: [name] Cliche in Treatment: 80 Subjective Chief Complaint Information obtained from Patient He is here in follow up evaluation for LLE pyoderma ulcer History of Present Illness (HPI) 12/04/16; 41 year old man who comes into the clinic today for review of a wound on the posterior left calf. He tells me that is been there for about a year. He is not a diabetic he does smoke half a pack per day. He was seen in the ER on 11/20/16 felt to have cellulitis around the wound and was given clindamycin. An x-ray did not show osteomyelitis. The patient initially tells me that he has a milk allergy that sets off a pruritic itching rash on his lower legs which she scratches incessantly and he thinks that's what may have set up the wound. He has been using various topical antibiotics and ointments without any effect. He works in a [employer] and is on his feet all day. He does not have a prior history of wounds however he does have the rash on both lower legs the right arm and the ventral aspect of his left arm. These are excoriations and clearly have had scratching however there are of macular looking areas on both legs including a substantial larger area on the right leg. This does not have an underlying open area. There is no  blistering. The patient tells me that 2 years ago in [place] in response to the rash on his legs he saw a dermatologist who told him he had a condition which may be pyoderma gangrenosum although I may be putting words into his mouth. He seemed to recognize this. On further questioning he admits to a 5 year history of quiesced. ulcerative colitis. He is not in any treatment for this. He's had no recent travel 12/11/16; the patient arrives today with his wound and roughly the same condition we've been using silver alginate this is a deep punched out wound with some surrounding erythema but no tenderness. Biopsy I did did not  show confirmed pyoderma gangrenosum suggested nonspecific inflammation and vasculitis but does not provide an actual description of what was seen by the pathologist. I'm really not able to understand this We have also received information from the patient's dermatologist in [place] notes from April 2016. This was a doctor Agarwal-antal. 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 He was prescribed topical steroid high potency under occlusion which helped but at this point the patient did not have a deep punched out wound. 12/18/16; the patient's wound is larger in terms of surface area however this surface looks better and there is less depth. The surrounding erythema also is better. The patient states that the wrap we put on came off 2 days ago when he has been using his compression stockings. He we are in the process of getting a dermatology consult. 12/26/16 on evaluation today patient's left lower extremity wound shows evidence of infection with surrounding erythema noted. He has been tolerating the dressing changes but states that he has noted more discomfort. There is a larger area of erythema surrounding the wound. No fevers, chills, nausea, or vomiting noted at this time. With that being said the wound still does have slough covering the surface. He is not allergic to  any medication that he is aware of at this point. In regard to his right lower extremity he had several regions that are erythematous and pruritic he wonders if there's anything we can do to help that. 01/02/17 I reviewed patient's wound culture which was obtained his visit last week. He was placed on doxycycline at that point. Unfortunately that does not appear to be an antibiotic that would likely help with the situation however the pseudomonas noted on culture is sensitive to Cipro. Also unfortunately patient's wound seems to have a large compared to last week's evaluation. Not severely so but there are definitely increased measurements in general. He is continuing to have discomfort as well he writes this to be a seven out of 10. In fact he would prefer me not to perform any debridement today due to the fact that he is having discomfort and considering he has an active infection on the little reluctant to do so anyway. No fevers, chills, nausea, or vomiting noted at this time. 01/08/17; patient seems dermatology on September 5. I suspect dermatology will want the slides from the biopsy I did sent to their pathologist. I'm not sure if there is a way we can expedite that. In any case the culture I did before I left on vacation 3 weeks ago showed Pseudomonas he was given 10 days of Cipro and per her description of her intake nurses is actually somewhat better this week although the wound is quite a bit bigger than I remember the last time I saw this. He still has 3 more days of Cipro 01/21/17; dermatology appointment tomorrow. He has completed the ciprofloxacin for Pseudomonas. Surface of the wound looks better however he is had some deterioration in the lesions on his right leg. Meantime the left lateral leg wound we will continue with sample 01/29/17; patient had his dermatology appointment but I can't yet see that note. He is completed his antibiotics. The wound is more superficial but considerably  larger in circumferential area than when he came in. This is in his left lateral calf. He also has swollen erythematous areas with superficial wounds on the right leg and small papular areas on both arms. There apparently areas in her his upper thighs and buttocks I  did not look at those. Dermatology biopsied the right leg. Hopefully will have their input next week. 02/05/17; patient went back to see his dermatologist who told him that he had a "scratching problem" as well as staph. He is now on a 30 day course of doxycycline and I believe she gave him triamcinolone cream to the right leg areas to help with the itching [not exactly sure but probably triamcinolone]. She apparently looked at the left lateral leg wound although this was not rebiopsied and I think felt to be ultimately part of the same pathogenesis. He is using sample border foam and changing nevus himself. He now has a new open area on the right posterior leg which was his biopsy site I don't have any of the dermatology notes 02/12/17; we put the patient in compression last week with SANTYL to the wound on the left leg and the biopsy. Edema is much better and the depth of the wound is now at level of skin. Area is still the same Biopsy site on the right lateral leg we've also been using santyl with a border foam dressing and he is changing this himself. 02/19/17; Using silver alginate started last week to both the substantial left leg wound and the biopsy site on the right wound. He is tolerating compression well. Has a an appointment with his primary M.D. tomorrow wondering about diuretics although I'm wondering if the edema problem is actually lymphedema [name], [name] ([id number]) 02/26/17; the patient has been to see his primary doctor Dr. Jerrel Ivory at [facility] our primary care. She started him on Lasix 20 mg and this seems to have helped with the edema. However we are not making substantial change with the left lateral calf wound  and inflammation. The biopsy site on the right leg also looks stable but not really all that different. 03/12/17; the patient has been to see vein and vascular Dr. Lucky Cowboy. He has had venous reflux studies I have not reviewed these. I did get a call from his dermatology office. 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 They therefore recommended clobetasol and tetracycline i.e. aggressive treatment for possible pyoderma gangrenosum. 03/26/17; apparently the patient just had reflux studies not an appointment with Dr. dew. She arrives in clinic today having applied clobetasol for 2-3 weeks. He notes over the last 2-3 days excessive drainage having to change the dressing 3-4 times a day and also expanding erythema. 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 Lillia Carmel at [facility] dermatology locally who kindly saw him at my request. A repeat biopsy apparently has confirmed pyoderma gangrenosum and he started on prednisone 60 mg yesterday. My concern was the degree of erythema medially extending from his left leg wound which was either inflammation from pyoderma or cellulitis. I put him on Augmentin however culture of the wound showed Pseudomonas which is quinolone sensitive. I really don't believe he has cellulitis however in view of everything I will continue and give him a  course of Cipro. He is also on doxycycline as an immune modulator for the pyoderma. In addition to his original wound on the  left lateral leg with surrounding erythema he has a wound on the right posterior calf which was an original biopsy site done by dermatology. This was felt to represent pathergy from pyoderma gangrenosum 04/16/17; pyoderma gangrenosum. Saw Dr. Lillia Carmel yesterday. He has been using topical antibiotics to both wound areas his original wound on the left and the biopsies/pathergy area on the right. There is definitely some improvement in the inflammation around the wound on the right although the patient states he has increasing sensitivity of the wounds. He is on prednisone 60 and doxycycline 1 as prescribed by Dr. Lillia Carmel. He is covering the topical antibiotic with gauze and putting this in his own compression stocks and changing this daily. He states that Dr. Lottie Rater did a culture of the left leg wound yesterday 05/07/17; pyoderma gangrenosum. The patient saw Dr. Lillia Carmel yesterday and has a follow-up with her in one month. He is still using topical antibiotics to both wounds although he can't recall exactly what type. He is still on prednisone 60 mg. Dr. Lillia Carmel stated that the doxycycline could stop if we were in agreement. He has been using his own compression stocks changing daily 06/11/17; pyoderma gangrenosum with wounds on the left lateral leg and right medial leg. The right medial leg was induced by biopsy/pathergy. The area on the right is essentially healed. Still on high-dose prednisone using topical antibiotics to the wound 07/09/17; pyoderma gangrenosum with wounds on the left lateral leg. The right medial leg has closed and remains closed. He is still on prednisone 60. He tells me he missed his last dermatology appointment with Dr. Lillia Carmel but will make another appointment. He reports that her blood sugar at a recent screen in [place] was high 200's. He was 180 today. 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 I think he is going to require still much longer prednisone  perhaps another 3 months before attempting to taper. In the meantime his wound is a lot better. Smaller. He is cleaning this off daily and applying topical antibiotics. When he was last in the clinic I thought about changing to [facility] and actually put in a couple of calls to dermatology although probably not during their business hours. 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 He continues on oral prednisone. He has been using triple antibiotic ointment. There is surface debris and we will transition to [facility] and have him return in 2 weeks. He has lost 30 pounds since his last appointment with lifestyle modification. He may benefit from topical steroid cream for treatment this can be considered at a later date. 08/22/17 on evaluation today patient appears to actually be doing rather well in regard to his left lateral lower extremity ulcer. He has actually been managed by Dr. Dellia Nims most recently. Patient is currently on oral steroids at this time. This seems to have been of benefit for him. Nonetheless his last visit was actually with Leah on 08/06/17. Currently he is not utilizing any topical steroid creams although this could be of benefit as well. No fevers, chills, nausea, or vomiting noted at this time. 09/05/17 on evaluation today patient appears to be doing better in regard to his left lateral lower extremity ulcer.  He has been tolerating the dressing changes without complication. He is using Santyl with good effect. Overall I'm very pleased with how things are standing at this point. Patient likewise is happy that this is doing better. 09/19/17 on evaluation today patient actually appears to be doing rather well in regard to his left lateral lower extremity ulcer. Again this is secondary to Pyoderma gangrenosum and he seems to be progressing well with the Santyl which  is good news. He's not having any significant pain. 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 He has been tolerating the Santyl without complication and in general I feel like he's making good progress. 10/17/17 on evaluation today patient appears to be doing very well in regard to his left lateral lower surety ulcer. He has been tolerating the dressing changes without complication. There does not appear to be any evidence of infection he's alternating the Santyl and the triple antibiotic ointment every other day this seems to be doing well for him. 11/03/17 on evaluation today patient appears to be doing very well in regard to his left lateral lower extremity ulcer. He is been tolerating the dressing changes without complication which is good news. Fortunately there does not appear to be any evidence of infection which is also great news. Overall is doing excellent they are starting to taper down on the prednisone is down to 40 mg at this point it also started topical clobetasol for him. 11/17/17 on evaluation today patient appears to be doing well in regard to his left lateral lower surety ulcer. He's been tolerating the dressing changes without complication. He does note that he is having no pain, no excessive drainage or discharge, and overall he feels like things are going about how he would expect and hope they would. Overall he seems to have no evidence of infection at this time in my opinion which is good news. 12/04/17-He is seen in follow-up evaluation for right lateral lower extremity ulcer. He has been applying topical steroid cream. Today's measurement show slight increase in size. Over the next 2 weeks we will transition to every other day Santyl and steroid cream. He has been encouraged to monitor for changes and notify clinic with any concerns 12/15/17 on evaluation today  patient's left lateral motion the ulcer and fortunately is doing worse again at this point. This just since last week to this week has close to doubled in size according to the patient. I did not seeing last week's I do not have a visual to compare this to in our system was also down so we do not have all the charts and at this point. Nonetheless it does have me somewhat concerned in regard to the fact that again he was worried enough about it he has contact the dermatology that placed them back on the full strength, 50 mg a day of the prednisone that he was taken previous. He continues to alternate using clobetasol along with Santyl at this point. He is obviously somewhat frustrated. [name], [name] ([id number]) 12/22/17 on evaluation today patient appears to be doing a little worse compared to last evaluation. Unfortunately the wound is a little deeper and slightly larger than the last week's evaluation. With that being said he has made some progress in regard to the irritation surrounding at this time unfortunately despite that progress that's been made he still has a significant issue going on here. 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 For that reason I discussed with him today the possibility of perform a wound culture to ensure there's nothing overtly infected. 01/06/18 on evaluation today patient's wound is larger and deeper than previously evaluated. With that being said it did appear that his wound was infected after my last evaluation with him. Subsequently I did end up prescribing a prescription for Bactrim DS which she has been taking and having no complication with. Fortunately there does not appear to be any evidence of infection at this point in time as far as anything spreading, no want to touch, and overall I feel like things are  showing signs of improvement. 01/13/18 on evaluation today patient appears to be even a little larger and deeper than last time. There still muscle exposed in the base of the wound. Nonetheless he does appear to be less erythematous I do believe inflammation is calming down also believe the infection looks like it's probably resolved at this time based on what I'm seeing. No fevers, chills, nausea, or vomiting noted at this time. 01/30/18 on evaluation today patient actually appears to visually look better for the most part. Unfortunately those visually this looks better he does seem to potentially have what may be an abscess in the muscle that has been noted in the central portion of the wound. This is the first time that I have noted what appears to be fluctuance in the central portion of the muscle. With that being said I'm somewhat more concerned about the fact that this might indicate an abscess formation at this location. I do believe that an ultrasound would be appropriate. This is likely something we need to try to do as soon as possible. He has been switch to mupirocin ointment and he is no longer using the steroid ointment as prescribed by dermatology he sees them again next week he's been decreased from 60 to 40 mg of prednisone. 03/09/18 on evaluation today patient actually appears to be doing a little better compared to last time I saw him. There's not as much erythema surrounding the wound itself. He I did review his most recent infectious disease note which was dated 02/24/18. He saw Dr. Michel Bickers in [place]. 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 Patient is now off of antibiotics and they are continually observing this. There seems to be no change in the past two weeks in my pinion based on what the patient says and what I see today compared to what Dr. Megan Salon likely saw two weeks ago. No  fevers, chills, nausea, or vomiting noted at this time. 03/23/18 on evaluation today patient's wound actually appears to be showing signs of improvement which is good news. He is currently still on the Dapsone. He is also working on tapering the prednisone to get off of this and Dr. Lottie Rater is working with him in this regard. Nonetheless overall I feel like the wound is doing well it does appear based on the infectious disease note that I reviewed from Dr. 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 I did also review the results of his ultrasound of left lower  extremity which revealed there was a dentist tissue in the base of the wound without an abscess noted. 04/06/18 on evaluation today the patient's left lateral lower extremity ulcer actually appears to be doing fairly well which is excellent news. There does not appear to be any evidence of infection at this time which is also great news. Overall he still does have a significantly large ulceration although little by little he seems to be making progress. He is down to 10 mg a day of the prednisone. 04/20/18 on evaluation today patient actually appears to be doing excellent at this time in regard to his left lower extremity ulcer. He's making signs of good progress unfortunately this is taking much longer than we would really like to see but nonetheless he is making progress. Fortunately there does not appear to be any evidence of infection at this time. No fevers, chills, nausea, or vomiting noted at this time. The patient has not been using the Santyl due to the cost he hadn't got in this field yet. He's mainly been using the antibiotic ointment topically. Subsequently he also tells me that he really has not been scrubbing in the shower I think this would be helpful again as I told him it doesn't have to be anything too aggressive to  even make it believe just enough to keep it free of some of the loose slough and biofilm on the wound surface. 05/11/18 on evaluation today patient's wound appears to be making slow but sure progress in regard to the left lateral lower extremity ulcer. He is been tolerating the dressing changes without complication. Fortunately there does not appear to be any evidence of infection at this time. He is still just using triple antibiotic ointment along with clobetasol occasionally over the area. He never got the Santyl and really does not seem to intend to in my pinion. 06/01/18 on evaluation today patient appears to be doing a little better in regard to his left lateral lower extremity ulcer. He states that overall he does not feel like he is doing as well with the Dapsone as he did with the prednisone. Nonetheless he sees his dermatologist later today and is gonna talk to them about the possibility of going back on the prednisone. Overall again I believe that the wound would be better if you would utilize Santyl but he really does not seem to be interested in going back to the [place] at this point. He has been using triple antibiotic ointment. 06/15/18 on evaluation today patient's wound actually appears to be doing about the same at this point. Fortunately there is no signs of infection at this time. He has made slight improvements although he continues to not really want to clean the wound bed at this point. He states that he just doesn't mess with it he doesn't want to cause any problems with everything else he has going on. He has been on medication, antibiotics as prescribed by his dermatologist, for a staff infection of his lower extremities which is really drying out now and looking much better he tells me. Fortunately there is no sign of overall infection. 06/29/18 on evaluation today patient appears to be doing well in regard to his left lateral lower surety ulcer all things considering.  Fortunately his staff infection seems to be greatly improved compared to previous. He has no signs of infection and this is drying up quite nicely. He is still the doxycycline for this is no longer on cental, Dapsone, or any  of the other medications. His dermatologist has recommended possibility of an infusion but right now he does not want to proceed with that. 07/13/18 on evaluation today patient appears to be doing about the same in regard to his left lateral lower surety ulcer. Fortunately there's no signs of infection at this time which is great news. Unfortunately he still builds up a significant amount of Slough/biofilm of the surface of the wound he still is not really cleaning this as he should be appropriately. Again I'm able to easily with saline and gauze remove the majority of this on the surface which if you would do this at home would likely be a dramatic improvement for him as far as getting the area to improve. Nonetheless overall I still feel like he is making progress is just very slow. I think Santyl will be of benefit for him as well. Still he has not gotten this as of this point. 07/27/18 on evaluation today patient actually appears to be doing little worse in regards of the erythema around the periwound region of the wound he also tells me that he's been having more drainage currently compared to what he was experiencing last time I saw him. He states not quite as bad as what he had because this was infected previously but nonetheless is still appears to be doing poorly. Fortunately there is no evidence of systemic infection at this point. The patient tells me that he is not going to be able to afford the Santyl. He is still waiting to hear about the infusion therapy with his dermatologist. Apparently she wants an updated colonoscopy first. REISS, MOWREY ([id number]) 08/10/18 on evaluation today patient appears to be doing better in regard to his left lateral lower extremity ulcer.  Fortunately he is showing signs of improvement in this regard he's actually been approved for Remicade infusion's as well although this has not been scheduled as of yet. Fortunately there's no signs of active infection at this time in regard to the wound although he is having some issues with infection of the right lower extremity is been seen as dermatologist for this. Fortunately they are definitely still working with him trying to keep things under control. 09/07/18 on evaluation today patient is actually doing rather well in regard to his left lateral lower extremity ulcer. He notes these actually having some hair grow back on his extremity which is something he has not seen in years. He also tells me that the pain is really not giving them any trouble at this time which is also good news overall she is very pleased with the progress he's using a combination of the mupirocin along with the probate is all mixed. 09/21/18 on evaluation today patient actually appears to be doing fairly well all things considered in regard to his looks from the ulcer. He's been tolerating the dressing changes without complication. Fortunately there's no signs of active infection at this time which is good news he is still on all antibiotics or prevention of the staff infection. He has been on prednisone for time although he states it is gonna contact his dermatologist and see if she put them on a short course due to some irritation that he has going on currently. Fortunately there's no evidence of any overall worsening this is going very slow I think cental would be something that would be helpful for him although he states that $50 for tube is quite expensive. He therefore is not willing to get that at this  point. 10/06/18 on evaluation today patient actually appears to be doing decently well in regard to his left lateral leg ulcer. He's been tolerating the dressing changes without complication. Fortunately there's no  signs of active infection at this time. Overall I'm actually rather pleased with the progress he's making although it's slow he doesn't show any signs of infection and he does seem to be making some improvement. I do believe that he may need a switch up and dressings to try to help this to heal more appropriately and quickly. 10/19/18 on evaluation today patient actually appears to be doing better in regard to his left lateral lower extremity ulcer. This is shown signs of having much less Slough buildup at this point due to the fact he has been using the [employer]. Obviously this is very good news. The overall size of the wound is not dramatically smaller but again the appearance is. 11/02/18 on evaluation today patient actually appears to be doing quite well in regard to his lower [place] ulcer. A lot of the skin around the ulcer is actually somewhat irritating at this point this seems to be more due to the dressing causing irritation from the adhesive that anything else. Fortunately there is no signs of active infection at this time. 11/24/18 on evaluation today patient appears to be doing a little worse in regard to his overall appearance of his lower extremity ulcer. There's more erythema and warmth around the wound unfortunately. He is currently on doxycycline which he has been on for some time. With that being said I'm not sure that seems to be helping with what appears to possibly be an acute cellulitis with regard to his left lower extremity ulcer. No fevers, chills, nausea, or vomiting noted at this time. 12/08/18 on evaluation today patient's wounds actually appears to be doing significantly better compared to his last evaluation. He has been using Santyl along with alternating tripling about appointment as well as the steroid cream seems to be doing quite well and the wound is showing signs of improvement which is excellent news. Fortunately there's no evidence of infection and in fact his culture  came back negative with only normal skin flora noted. 12/21/2018 upon evaluation today patient actually appears to be doing excellent with regard to his ulcer. This is actually the best that I have seen it since have been helping to take care of him. It is both smaller as well as less slough noted on the surface of the wound and seems to be showing signs of good improvement with new skin growing from the edges. He has been using just the triamcinolone he does wonder if he can get a refill of that ointment today. 01/04/2019 upon evaluation today patient actually appears to be doing well with regard to his left lateral lower extremity ulcer. With that being said it does not appear to be that he is doing quite as well as last time as far as progression is concerned. There does not appear to be any signs of infection or significant irritation which is good news. With that being said I do believe that he may benefit from switching to a collagen based dressing based on how clean The wound appears. 01/18/2019 on evaluation today patient actually appears to be doing well with regard to his wound on the left lower extremity. He is not made a lot of progress compared to where we were previous but nonetheless does seem to be doing okay at this time which is good  news. There is no signs of active infection which is also good news. My only concern currently is I do wish we can get him into utilizing the collagen dressing his insurance would not pay for the supplies that we ordered although it appears that he may be able to order this through his supply company that he typically utilizes. This is [employer]. Nonetheless he did try to order it during the office visit today and it appears this did go through. We will see if he can get that it is a different brand but nonetheless he has collagen and I do think will be beneficial. 02/01/2019 on evaluation today patient actually appears to be doing a little worse today in  regard to the overall size of his wounds. Fortunately there is no signs of active infection at this time. That is visually. Nonetheless when this is happened before it was due to infection. For that reason were somewhat concerned about that this time as well. 02/08/2019 on evaluation today patient unfortunately appears to be doing slightly worse with regard to his wound upon evaluation today. Is measuring a little deeper and a little larger unfortunately. I am not really sure exactly what is causing this to enlarge he actually did see his dermatologist she is going to see about initiating Humira for him. Subsequently she also did do steroid injections into the wound itself in the periphery. Nonetheless still nonetheless he seems to be getting a little bit larger he is gone back to just using the steroid cream topically which I think is appropriate. I would say hold off on the collagen for the time being is definitely a good thing to do. Based on the culture results which we finally did get the final result back regarding it shows staph as the bacteria noted again that can be a normal skin bacteria based on the fact however he is having increased drainage and worsening of the wound measurement wise I would go ahead and place him on an antibiotic today I do believe for this. 02/15/2019 on evaluation today patient actually appears to be doing somewhat better in regard to his ulcer. There is no signs of worsening at this time I did review his culture results which showed evidence of Staphylococcus aureus but not MRSA. Again this could just be more related to the normal skin bacteria although he states the drainage has slowed down quite a bit he may have had a mild infection not just colonization. And was much smaller and then since around10/04/2019 on evaluation today patient appears to be doing unfortunately worse as far as the size of the wound. I really feel like that this is steadily getting larger  again it had been doing excellent right at the beginning of September we have seen a steady increase in the area of the wound it is almost 2-1/2 times the size it was on September 1. Obviously this is a bad trend this is not wanting to see. For that reason we went back to using just the topical triamcinolone cream which does seem to help with inflammation. I checked him for bacteria by way of culture and nothing showed positive there. I am considering giving him a short course of a tapering steroid Yashua Bracco, Ayvin ([id number]) today to see if that is can be beneficial for him. The patient is in agreement with giving that a try. 03/08/2019 on evaluation today patient appears to be doing very well in comparison to last evaluation with regard to his  lower extremity ulcer. This is showing signs of less inflammation and actually measuring slightly smaller compared to last time every other week over the past month and a half he has been measuring larger larger larger. Nonetheless I do believe that the issue has been inflammation the prednisone does seem to have been beneficial for him which is good news. No fevers, chills, nausea, vomiting, or diarrhea. 03/22/2019 on evaluation today patient appears to be doing about the same with regard to his leg ulcer. He has been tolerating the dressing changes without complication. With that being said the wound seems to be mostly arrested at its current size but really is not making any progress except for when we prescribed the prednisone. He did show some signs of dropping as far as the overall size of the wound during that interval week. Nonetheless this is something he is not on long-term at this point and unfortunately I think he is getting need either this or else the Humira which his dermatologist has discussed try to get approval for. With that being said he will be seeing his dermatologist on the 11th of this month that is November. 04/19/2019 on  evaluation today patient appears to be doing really about the same the wound is measuring slightly larger compared to last time I saw him. He has not been into the office since November 2 due to the fact that he unfortunately had Covid as that his entire family. He tells me that it was rough but they did pull-through and he seems to be doing much better. Fortunately there is no signs of active infection at this time. No fevers, chills, nausea, vomiting, or diarrhea. 05/10/2019 on evaluation today patient unfortunately appears to be doing significantly worse as compared to last time I saw him. He does tell me that he has had his first dose of Humira and actually is scheduled to get the next one in the upcoming week. With that being said he tells me also that in the past several days he has been having a lot of issues with green drainage she showed me a picture this is more blue-green in color. He is also been having issues with increased sloughy buildup and the wound does appear to be larger today. Obviously this is not the direction that we want everything to take based on the starting of his Humira. Nonetheless I think this is definitely a result of likely infection and to be honest I think this is probably Pseudomonas causing the infection based on what I am seeing. 05/24/2019 on evaluation today patient unfortunately appears to be doing significantly worse compared to his prior evaluation with me 2 weeks ago. I did review his culture results which showed that he does have Staph aureus as well as Pseudomonas noted on the culture. Nonetheless the Levaquin that I prescribed for him does not appear to have been appropriate and in fact he tells me he is no longer experiencing the green drainage and discharge that he had at the last visit. Fortunately there is no signs of active infection at this time which is good news although the wound has significantly worsened it in fact is much deeper than it was  previous. We have been utilizing up to this point triamcinolone ointment as the prescription topical of choice but at this time I really feel like that the wound is getting need to be packed in order to appropriately manage this due to the deeper nature of the wound. Therefore something along  the lines of an alginate dressing may be more appropriate. 05/31/2019 upon inspection today patient's wound actually showed signs of doing poorly at this point. Unfortunately he just does not seem to be making any good progress despite what we have tried. He actually did go ahead and pick up the Cipro and start taking that as he was noticing more green drainage he had previously completed the Levaquin that I prescribed for him as well. Nonetheless he missed his appointment for the seventh last week on Wednesday with the wound care center and [facility] where his dermatologist referred him. Obviously I do think a second opinion would be helpful at this point especially in light of the fact that the patient seems to be doing so poorly despite the fact that we have tried everything that I really know how at this point. The only thing that ever seems to have helped him in the past is when he was on high doses of continual steroids that did seem to make a difference for him. 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 06/07/2019 upon evaluation today patient unfortunately appears to be doing worse yet again with regard to his wound. In fact I am starting to question whether or not he may have a fluid pocket in the muscle at this point based on the bulging and the soft appearance to the central portion of the muscle area. There is not anything draining from the muscle itself at this time which is good news but nonetheless the wound is expanding. I am not really  seeing any results of the Humira as far as overall wound progression based on what I am seeing at this point. The patient has been referred for second opinion with regard to his wound to the [facility] wound care center by his dermatologist which I definitely am not in opposition to. Unfortunately we tried multiple dressings in the past including collagen, alginate, and at one point even Hydrofera Blue. With that being said he is never really used it for any significant amount of time due to the fact that he often complains of pain associated with these dressings and then will go back to either using the Santyl which she has done intermittently or more frequently the triamcinolone. He is also using his own compression stockings. We have wrapped him in the past but again that was something else that he really was not a big fan of. 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 He seemed to do much better when he was on the steroids although honestly I understand that the steroids are not necessarily the best medication to be on long-term obviously 06/14/2019 on evaluation today patient appears to be doing actually a little bit better with regard to the overall appearance with his leg. Unfortunately he does continue to have issues with what appears to be some fluid underneath the muscle although he did see the wound specialty center at [facility] last week  their main goals were to see about infusion therapy in place of the Humira as they feel like that is not quite strong enough. They also recommended that we continue with the treatment otherwise as we are they felt like that was appropriate and they are  okay with him continuing to follow-up here with [place] in that regard. With that being said they are also sending him to the vein specialist there to see about vein stripping and if that would be of benefit for him. Subsequently they also did not really address whether or not an ultrasound of the muscle area to see if there is anything that needs to be addressed here would be appropriate or not. For that reason I discussed this with him last week I think we may proceed down that road at this point. 06/21/2019 upon evaluation today patient's wound actually appears to be doing slightly better compared to previous evaluations. I do believe that he has made a difference with regard to the progression here with the use of oral steroids. Again in the past has been the only thing that is really calm things down. He does tell me that from [facility] is gotten a good news from there that there are no further vein stripping that is necessary at this point. I do not have that available for review today although the patient did relay this to me. He also did obtain and have the ultrasound of the wound completed which I did sign off on today. It does appear that there is no fluid collection under the muscle this is likely then just edematous tissue in general. That is also good news. Overall I still believe the inflammation is the main issue here. He did inquire about the possibility of a wound VAC again with the muscle protruding like it is I am not really sure whether the wound VAC is necessarily ideal or not. That is something we will have to consider although I do believe he may need compression wrapping to try to help with edema control which could potentially be of benefit. 06/28/2019 on evaluation today patient appears to be doing slightly better measurement wise although this is not terribly smaller he least seems to be trending towards that direction. With that being said he still seems to have purulent drainage noted in  the wound bed at this time. He has been on Levaquin followed by Cipro over the past month. Unfortunately he still seems to have some issues with active infection at this time. I did perform a culture last week in order to evaluate and see if indeed there was still anything going on. Subsequently the culture did come back Sandora, Asah ([id number]) showing Pseudomonas which is consistent with the drainage has been having which is blue-green in color. He also has had an odor that again was somewhat consistent with Pseudomonas as well. Long story short it appears that the culture showed an intermediate finding with regard to how well the Cipro will work for the Pseudomonas infection. Subsequently being that he does not seem to be clearing up and at best what we are doing is just keeping this at [place] I think he may need to see infectious disease to discuss IV antibiotic options. 07/05/2019 upon evaluation today patient appears to be doing okay in regard to his leg ulcer. He has been tolerating the dressing changes at this point without complication. Fortunately there is no signs of active infection at this time which is good news. No fevers, chills,  nausea, vomiting, or diarrhea. With that being said he does have an appointment with infectious disease tomorrow and his primary care on Wednesday. Again the reason for the infectious disease referral was due to the fact that he did not seem to be fully resolving with the use of oral antibiotics and therefore we were thinking that IV antibiotic therapy may be necessary secondary to the fact that there was an intermediate finding for how effective the Cipro may be. Nonetheless again he has been having a lot of purulent and even green drainage. Fortunately right now that seems to have calmed down over the past week with the reinitiation of the oral antibiotic. Nonetheless we will see what Dr. Megan Salon has to say. 07/12/2019 upon evaluation today patient appears to  be doing about the same at this point in regard to his left lower extremity ulcer. Fortunately there is no signs of active infection at this time which is good news I do believe the Levaquin has been beneficial I did review Dr. Hale Bogus note and to be honest I agree that the patient's leg does appear to be doing better currently. What we found in the past as he does not seem to really completely resolve he will stop the antibiotic and then subsequently things will revert back to having issues with blue-green drainage, increased pain, and overall worsening in general. Obviously that is the reason I sent him back to infectious disease. 07/19/2019 upon evaluation today patient appears to be doing roughly the same in size there is really no dramatic improvement. He has started back on the Levaquin at this point and though he seems to be doing okay he did still have a lot of blue/green drainage noted on evaluation today unfortunately. I think that this is still indicative more likely of a Pseudomonas infection as previously noted and again he does see Dr. Megan Salon in just a couple of days. I do not know that were really able to effectively clear this with just oral antibiotics alone based on what I am seeing currently. Nonetheless we are still continue to try to manage as best we can with regard to the patient and his wound. I do think the wrap was helpful in decreasing the edema which is excellent news. No fevers, chills, nausea, vomiting, or diarrhea. 07/26/2019 upon evaluation today patient appears to be doing slightly better with regard to the overall appearance of the muscle there is no dark discoloration centrally. Fortunately there is no signs of active infection at this time. No fevers, chills, nausea, vomiting, or diarrhea. Patient's wound bed currently the patient did have an appointment with Dr. Megan Salon at infectious disease last week. With that being said Dr. Megan Salon the patient states was still  somewhat hesitant about put him on any IV antibiotics he wanted [place] to repeat cultures today and then see where things go going forward. He does look like Dr. Megan Salon because of some improvement the patient did have with the Levaquin wanted [place] to see about repeating cultures. If it indeed grows the Pseudomonas again then he recommended a possibility of considering a PICC line placement and IV antibiotic therapy. He plans to see the patient back in 1 to 2 weeks. 08/02/2019 upon evaluation today patient appears to be doing poorly with regard to his left lower extremity. We did get the results of his culture back it shows that he is still showing evidence of Pseudomonas which is consistent with the purulent/blue-green drainage that he has currently. Subsequently the culture  also shows that he now is showing resistance to the oral fluoroquinolones which is unfortunate as that was really the only thing to treat the infection prior. I do believe that he is looking like this is going require IV antibiotic therapy to get this under control. Fortunately there is no signs of systemic infection at this time which is good news. The patient does see Dr. Megan Salon tomorrow. 08/09/2019 upon evaluation today patient appears to be doing better with regard to his left lower extremity ulcer in regard to the overall appearance. He is currently on IV antibiotic therapy. As ordered by Dr. Megan Salon. Currently the patient is on ceftazidime which she is going to take for the next 2 weeks and then follow-up for 4 to 5-week appointment with Dr. Megan Salon. The patient started this this past Friday symptoms have not for a total of 3 days currently in full. 08/16/2019 upon evaluation today patient's wound actually does show muscle in the base of the wound but in general does appear to be much better as far as the overall evidence of infection is concerned. In fact I feel like this is for the most part cleared up he still on the IV  antibiotics he has not completed the full course yet but I think he is doing much better which is excellent news. 08/23/2019 upon evaluation today patient appears to be doing about the same with regard to his wound at this point. He tells me that he still has pain unfortunately. Fortunately there is no evidence of systemic infection at this time which is great news. There is significant muscle protrusion. 09/13/19 upon evaluation today patient appears to be doing about the same in regard to his leg unfortunately. He still has a lot of drainage coming from the ulceration there is still muscle exposed. With that being said the patient's last wound culture still showed an intermediate finding with regard to the Pseudomonas he still having the bluish/green drainage as well. Overall I do not know that the wound has completely cleared of infection at this point. Fortunately there is no signs of active infection systemically at this point which is good news. 09/20/2019 upon evaluation today patient's wound actually appears to be doing about the same based on what I am seeing currently. I do not see any signs of systemic infection he still does have evidence of some local infection and drainage. He did see Dr. Megan Salon last week and Dr. Megan Salon states that he probably does need a different IV antibiotic although he does not want to put him on this until the patient begins the Remicade infusion which is actually scheduled for about 10 days out from today on 13 May. Following that time Dr. Megan Salon is good to see him back and then will evaluate the feasibility of starting him on the IV antibiotic therapy once again at that point. I do not disagree with this plan I do believe as Dr. Megan Salon stated in his note that I reviewed today that the patient's issue is multifactorial with the pyoderma being 1 aspect of this that were hoping the Remicade will be helpful for her. In the meantime I think the gentamicin is,  helping to keep things under decent okay control in regard to the ulcer. 09/27/2019 upon evaluation today patient appears to be doing about the same with regard to his wound still there is a lot of muscle exposure though he does have some hyper granulation tissue noted around the edge and actually some granulation tissue  starting to form over the muscle which is actually good news. Fortunately there is no evidence of active infection which is also good news. His pain is less at this point. 5/21; this is a patient I have not seen in a long time. 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 He has a large wound on the left lateral leg with protruding muscle. He comes in the clinic today showing the same area on his left medial ankle. He says there is been a spot there for some time although we have not previously defined this. Today he has a clearly defined area with slight amount of skin breakdown surrounded by raised areas with a purplish hue in color. This is not painful he says it is irritated. 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 He still has muscle protruding from the lateral portion of his left leg but fortunately the new area that they were concerned about at his last visit does not appear to have opened at this point. He is currently on Remicade infusions and seems to be doing better in my opinion in fact the wound itself seems to be overall much better. The purplish discoloration that he did have seems to have resolved and I think that is a good sign that hopefully the Remicade is doing its job. He does [name], [name] ([id number]) have some biofilm noted over the surface of the wound. 11/01/2019 on evaluation today patient's wound actually appears to be doing excellent at this time. Fortunately there is no evidence of active infection and overall I  feel like he is making great progress. The Remicade seems to be due excellent job in my opinion. 11/08/19 evaluation today vision actually appears to be doing quite well with regard to his weight ulcer. He's been tolerating dressing changes without complication. Fortunately there is no evidence of infection. No fevers, chills, nausea, or vomiting noted at this time. Overall states that is having more itching than pain which is actually a good sign in my opinion. 12/13/2019 upon evaluation today patient appears to be doing well today with regard to his wound. He has been tolerating the dressing changes without complication. Fortunately there is no sign of active infection at this time. No fevers, chills, nausea, vomiting, or diarrhea. Overall I feel like the infusion therapy has been very beneficial for him. 01/06/2020 on evaluation today patient appears to be doing well with regard to his wound. This is measuring smaller and actually looks to be doing better. Fortunately there is no signs of active infection at this point. No fevers, chills, nausea, vomiting, or diarrhea. With that being said he does still have the blue-green drainage but this does not seem to be causing any significant issues currently. He has been using the gentamicin that does seem to be keeping things under decent control at this point. He goes later this morning for his next infusion therapy for the pyoderma which seems to also be very beneficial. 02/07/2020 on evaluation today patient appears to be doing about the same in regard to his wounds currently. Fortunately there is no signs of active infection systemically he does still have evidence of local infection still using gentamicin. He also is showing some signs of improvement albeit slowly I do feel like we are making some progress here. 02/21/2020 upon evaluation today patient appears to be making some signs of improvement the wound is measuring  a little bit smaller which  is great news and overall I am very pleased with where he stands currently. He is going to be having infusion therapy treatment on the 15th of this month. Fortunately there is no signs of active infection at this time. 03/13/2020 I do believe patient's wound is actually showing some signs of improvement here which is great news. He has continue with the infusion therapy through rheumatology/dermatology at [facility]. That does seem to be beneficial. I still think he gets as much benefit from this as he did from the prednisone initially but nonetheless obviously this is less harsh on his body that the prednisone as far as they are concerned. 03/31/2020 on evaluation today patient's wound actually showing signs of some pretty good improvement in regard to the overall appearance of the wound bed. There is still muscle exposed though he does have some epithelial growth around the edges of the wound. Fortunately there is no signs of active infection at this time. No fevers, chills, nausea, vomiting, or diarrhea. 04/24/2020 upon evaluation today patient appears to be doing about the same in regard to his leg ulcer. He has been tolerating the dressing changes without complication. Fortunately there is no signs of active infection at this time. No fevers, chills, nausea, vomiting, or diarrhea. With that being said he still has a lot of irritation from the bandaging around the edges of the wound. We did discuss today the possibility of a referral to plastic surgery. 05/22/2020 on evaluation today patient appears to be doing well with regard to his wounds all things considered. He has not been able to get the Chantix apparently there is a recall nurse that I was unaware of put out by [employer] involuntarily. Nonetheless for now I am and I have to do some research into what may be the best option for him to help with quitting in regard to smoking and we discussed that today. 06/26/2020 upon evaluation today patient appears to  be doing well with regard to his wound from the standpoint of infection I do not see any signs of infection at this point. With that being said unfortunately he is still continuing to have issues with muscle exposure and again he is not having a whole lot of new skin growth unfortunately. There does not appear to be any signs of active infection at this time. No fevers, chills, nausea, vomiting, or diarrhea. 07/10/2020 upon evaluation today patient appears to be doing a little bit more poorly currently compared to where he was previous. I am concerned currently about an active infection that may be getting worse especially in light of the increased size and tenderness of the wound bed. No fevers, chills, nausea, vomiting, or diarrhea. 07/24/2020 upon evaluation today patient appears to be doing poorly in regard to his leg ulcer. He has been tolerating the dressing changes without complication but unfortunately is having a lot of discomfort. 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 Subsequently I think he is going require possibly IV antibiotics to get this under control. I am very concerned about the severity of his infection and the amount of discomfort he is having. 07/31/2020 upon evaluation today patient appears to be doing about the same in regard to his leg wound. He did see Dr. Megan Salon and Dr. Megan Salon is actually going to start him on IV antibiotics. He goes for the PICC line tomorrow. With that being said there do not have that run  for 2 weeks and then see how things are doing and depending on how he is progressing they may extend that a little longer. Nonetheless I am glad this is getting ready to be in place and definitely feel it may help the patient. In the meantime is been using mainly triamcinolone to the wound bed has an anti-inflammatory. 08/07/2020 on evaluation today patient appears to be doing well with regard to  his wound compared even last week. In the interim he has gotten the PICC line placed and overall this seems to be doing excellent. There does not appear to be any evidence of infection which is great news systemically although locally of course has had the infection this appears to be improving with the use of the antibiotics. 08/14/2020 upon evaluation today patient's wound actually showing signs of excellent improvement. Overall the irritation has significantly improved the drainage is back down to more of a normal level and his pain is really pretty much nonexistent compared to what it was. Obviously I think that this is significantly improved secondary to the IV antibiotic therapy which has made all the difference in the world. Again he had a resistant form of Pseudomonas for which oral antibiotics just was not cutting it. Nonetheless I do think that still we need to consider the possibility of a surgical closure for this wound is been open so long and to be honest with muscle exposed I think this can be very hard to get this to close outside of this although definitely were still working to try to do what we can in that regard. 08/21/2020 upon evaluation today patient appears to be doing very well with regard to his wounds on the left lateral lower extremity/calf area. Fortunately there does not appear to be signs of active infection which is great news and overall very pleased with where things stand today. He is actually wrapping up his treatment with IV antibiotics tomorrow. After that we will see where things go from there. 08/28/2020 upon evaluation today patient appears to be doing decently well with regard to his leg ulcer. There does not appear to be any signs of Bouska, Romolo ([id number]) active infection which is great news and overall very pleased with where things stand today. No fevers, chills, nausea, vomiting, or diarrhea. 09/18/2020 upon evaluation today patient appears to be doing well  with regard to his infection which I feel like is better. Unfortunately he is not doing as well with regard to the overall size of the wound which is not nearly as good at this point. I feel like that he may be having an issue here with the pyoderma being somewhat out of control. I think that he may benefit from potentially going back and talking to the dermatologist about what to do from the pyoderma standpoint. I am not certain if the infusions are helping nearly as much is what the prednisone did in the past. 10/02/2020 upon evaluation today patient appears to be doing well with regard to his leg ulcer. He did go to the [facility]iatric nurse. Unfortunately they feel like there is a 10% chance that most that he would be able to heal and that the skin graft would take. Obviously this has led him to not be able to go down that path as far as treatment is concerned. Nonetheless he does seem to be doing a little bit better with the prednisone that I gave him last time. I think that he may need to discuss with dermatology  the possibility of long-term prednisone as that seems to be what is most helpful for him to be perfectly honest. I am not sure the Remicade is really doing the job. 10/17/2020 upon evaluation today patient appears to be doing a little better in regard to his wound. In fact the case has been since we did the prednisone on May 2 for him that we have noticed a little bit of improvement each time we have seen a size wise as well as appearance wise as well as pain wise. I think the prednisone has had a greater effect then the infusion therapy has to be perfectly honest. With that being said the patient also feels significantly better compared to what he was previous. All of this is good news but nonetheless I am still concerned about the fact that again we are really not set up to long-term manage him as far as prednisone is concerned. Obviously there are things that you need to be watched I  completely understand the risk of prednisone usage as well. 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 With all that being said I do believe that we can give him another round of the prednisone which she is requesting today because of the improvement that he seen since we did that first round. 10/30/2020 upon evaluation today patient's wound actually is showing signs of doing quite well. There does not appear to be any evidence of infection which is great news and overall very pleased with where things stand today. No fevers, chills, nausea, vomiting, or diarrhea. He tells me that the prednisone still has seem to have helped he wonders if we can extend that for just a little bit longer. He did not have the appointment with a dermatologist although he did have an infusion appointment last Friday. That was at [facility]. With that being said he tells me he could not do both that as well as the appointment with the physician on the same day therefore that is can have to be rescheduled. I really want to see if there is anything they feel like that could be done differently to try to help this out as I am not really certain that the infusions are helping significantly here. 11/13/2020 upon evaluation today patient unfortunately appears to be doing somewhat poorly in regard to his wound I feel like this is actually worsening from the standpoint of the pyoderma spreading. I still feel like that he may need something different as far as trying to manage this going forward. Again we did the prednisone unfortunately his blood sugars are not doing so well because of this. Nonetheless I believe that the patient likely needs to try topical steroid. We have done triamcinolone for a while I think going with something stronger such as clobetasol could be beneficial again this is not something I do lightly I discussed this with the patient that again this does not normally put  underneath an occlusive dressing. Nonetheless I think a thin film as such could help with some of the stronger anti-inflammatory effects. We discussed this today. He would like to try to give this a trial for the next couple weeks. I definitely think that is something that we can do. Evaluate7/03/2021 and today patient's wound bed actually showed signs of doing really about the same. There was a little expansion of the size of the wound and that leading edge that we done looking out although the clobetasol  does seem to have slowed this down a bit in my opinion. There is just 1 small area that still seems to be progressing based on what I see. Nonetheless I am concerned about the fact this does not seem to be improving if anything seems to be doing a little bit worse. I do not know that the infusions are really helping him much as next infusion is August 5 his appointment with dermatology is July 25. Either way I really think that we need to have a conversation potentially about this and I am actually going to see if I can talk with Dr. Lillia Carmel in order to see where things stand as well. 12/11/2020 upon evaluation today patient appears to be doing worse in regard to his leg ulcer. Unfortunately I just do not think this is making the progress that I would like to see at this point. Honestly he does have an appointment with dermatology and this is in 2 days. I am wondering what they may have to offer to help with this. Right now what I am seeing is that he is continuing to show signs of worsening little by little. Obviously that is not great at all. Is the exact opposite of what we are looking for. 12/18/2020 upon evaluation today patient appears to be doing a little better in regard to his wound. The dermatologist actually did do some steroid injections into the wound which does seem to have been beneficial in my opinion. That was on the 25th already this looks a little better to me than last time I saw  him. With that being said we did do a culture and this did show that he has Staph aureus noted in abundance in the wound. With that being said I do think that getting him on an oral antibiotic would be appropriate as well. Also think we can compression wrap and this will make a difference as well. 12/28/2020 upon evaluation today patient's wound is actually showing signs of doing much better. I do believe the compression wrap is helping he has a lot of drainage but to be honest I think that the compression is helping to some degree in this regard as well as not draining through which is also good news. No fevers, chills, nausea, vomiting, or diarrhea. 01/04/2021 upon evaluation today patient appears to be doing well with regard to his wound. Overall things seem to be doing quite well. He did have a little bit of reaction to the CarboFlex Sorbact he will be using that any longer. With that being said he is controlled as far as the drainage is concerned overall and seems to be doing quite well. I do not see any signs of active infection at this time which is great news. No fevers, chills, nausea, vomiting, or diarrhea. 01/11/2021 upon evaluation today patient appears to be doing well with regard to his wounds. He has been tolerating the dressing changes without complication. Fortunately there does not appear to be any signs of active infection at this time which is great news. Overall I am extremely pleased with where we stand currently. No fevers, chills, nausea, vomiting, or diarrhea. Where using clobetasol in the wound bed he has a lot of new skin growth which is awesome as well. 01/18/2021 upon evaluation today patient appears to be doing very well in regard to his leg ulcer. He has been tolerating the dressing changes without complication. Fortunately there does not appear to be any signs of active infection which is  great news. In general I think that he is making excellent progress 01/25/2021 upon  evaluation today patient appears to be doing well with regard to his wound on the leg. I am actually extremely pleased with where things stand today. There does not appear to be any signs of active infection which is great news and overall I think that we are definitely headed in the appropriate direction based on what I am seeing currently. There does not appear to be any signs of active infection also excellent news. 02/06/2021 upon evaluation today patient appears to be doing well with regard to his wound. Overall visually this is showing signs of significant Bekele, Shed ([id number]) improvement which is great news. I do not see any signs of active infection systemically which is great even locally I do not think that we are seeing any major complications here. We did do fluorescence imaging with the [employer] today. The patient does have some odor and drainage noted and again this is something that I think would benefit him to probably come more frequently for nurse visits. 02/19/2021 upon evaluation today patient actually appears to be doing quite well in regard to his wound. He has been tolerating the dressing changes without complication and overall I think that this is making excellent progress. I do not see any evidence of active infection at this point which is great news as well. No fevers, chills, nausea, vomiting, or diarrhea. 10/10; wound is made nice progress healthy granulation with a nice rim of epithelialization which seems to be expanding even from last week he has a deeper area in the inferior part of the more distal part of the wound with not quite as healthy as surface. This area will need to be followed. Using clobetasol and Hydrofera Blue 03/05/2021 upon evaluation today patient appears to be doing very well in regard to his leg ulcer. He has been tolerating dressing changes without complication. Fortunately there does not appear to be any signs of active infection which is  great news and overall I am extremely pleased with where we stand currently. 03/12/2021 upon evaluation today patient appears to be doing well with regard to his wound in fact this is extremely extremely good based on what we are seeing today there does not appear to be any signs of active infection and overall I think that he is doing awesome from the standpoint of healing in general. I am extremely pleased with how things seem to be progressing with regard to this pyoderma. Clobetasol has done wonders for him. I think the compression wrapping has also been of great benefit. 03/19/2021 upon evaluation today patient appears to be doing well with regard to his wound. He is tolerating the dressing changes without complication. In fact I feel like that he is actually making excellent progress at this point based on what I am seeing. No fevers, chills, nausea, vomiting, or diarrhea. 03/26/2021 upon evaluation today patient appears to be doing well with regard to his wound. This again is measuring smaller and looking better. Again the progress is slow but nonetheless continual with what we have been seeing. I do believe that the current plan is doing awesome for him. 04/09/2021 upon evaluation today patient appears to be doing well with regard to his leg ulcer. This is showing signs of excellent improvement the muscle is completely closed over and there does not appear to be any evidence of inflammation at this point his drainage is significantly improved. Overall I think that  he would be a good candidate for looking into a skin substitute at this point as well. We will get a look into some approvals in that regard. Potentially TheraSkin as well as Apligraf could both be considered just depending on insurance coverage. 04/16/2021 upon evaluation today patient appears to be doing well at this point. He has been approved for the Apligraf which we could definitely order although I would like to try to get the  TheraSkin approved if at all possible. I did fax notes into them today and I Georgina Peer try to give a call as well. Overall the wound appears to be doing decently well today. 04/20/2021 upon evaluation today patient actually appears to be doing quite well in regard to his wound with that being said we are trying to see what we can do about speeding up the healing process. For that reason we did discuss the possibility of a skin substitute. We got the Apligraf approved. For that reason we will get a go ahead and see what we can do with the Apligraf at this point. I am still trying to get the TheraSkin approved but I have not heard anything from the insurance company yet I have called and talked to them earlier in the week and to be honest this was about half an hour that I spent on the phone they told me I would hear something within 10-15 business days. 04/27/2021 upon evaluation today patient appears to be doing excellent in regard to his wound. I do believe the Apligraf has been beneficial. With that being said he has had a little bit of increased pain has been a little bit concerned. I do want to go ahead and apply his steroid cream today the clobetasol and then put the Apligraf over I just do not think I want to risk not putting the clobetasol on based on what is been going on here currently. Patient voiced understanding. 05/04/2021 upon evaluation today patient is making excellent improvement. Overall there is definitely decrease in the size of the wound today and I am very pleased in that regard. I do not see any signs of active infection locally nor systemically at this point. No fevers, chills, nausea, vomiting, or diarrhea. 05/11/2021 upon evaluation today patient appears to be doing well with regard to his wound. He has been tolerating Apligraf's without complication and is making excellent progress this is application #4 today. 12/30; patient here for Apligraf application. Appears to be doing  well small wound there has been a lot of healing 05/28/2021 upon evaluation today patient appears to be doing well with regard to his wound. Has been tolerating the dressing changes without complication. Fortunately I do not see any signs of active infection locally nor systemically at this point which is great news. He is done with the Apligraf and the first round and overall this has filled in quite nicely I am not even certain he needs any additional at the moment. I would actually try to go back to clobetasol with [facility] which previously was doing well for him. 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 He is using clobetasol and Hydrofera Blue. Surface area of the wound is smaller and the surface looks very healthy. 06/11/2021 upon evaluation today patient actually appears to be showing signs of good improvement which is great news. Fortunately I do not see any signs of active infection at this time which is also excellent news. I do believe  that the patient is doing well with the clobetasol and Hydrofera Blue. He does have some irritation and itching around the rest of the leg will use some triamcinolone over this area. 06/18/2021 upon evaluation today patient appears to be doing well with regard to his wound. He is showing signs of excellent improvement and overall I am extremely pleased with where we stand today. We are moving in the right direction. 06/25/2021 upon evaluation today patient appears to be doing well with regard to his wound. In fact this is doing also not showing signs of excellent improvement overall very pleased with where we stand today. I do not see any evidence of active infection locally or systemically at this time which is also great news. DARELD, MCAULIFFE ([id number]) Objective Constitutional Well-nourished and well-hydrated in no acute distress. Vitals Time Taken: 8:13 AM, Height: 71 in, Weight: 338 lbs, BMI: 47.1,  Temperature: 97.8 F, Pulse: 67 bpm, Respiratory Rate: 18 breaths/min, Blood Pressure: 126/86 mmHg. Respiratory normal breathing without difficulty. Psychiatric this patient is able to make decisions and demonstrates good insight into disease process. Alert and Oriented x 3. pleasant and cooperative. General Notes: He is on doxycycline for respiratory issue. Upon inspection patient's wound bed showed signs of good granulation epithelization at this point. Fortunately I do not see any evidence of infection actively at this point which is great. With that being said the patient does seem to have a couple areas where he has an ingrown hair or irritation. Integumentary (Hair, Skin) Wound #1 status is Open. Original cause of wound was Gradually Appeared. The date acquired was: 11/18/2015. The wound has been in treatment 237 weeks. The wound is located on the Left,Lateral Lower Leg. The wound measures 2.4cm length x 1.5cm width x 0.1cm depth; 2.827cm^2 area and 0.283cm^3 volume. There is Fat Layer (Subcutaneous Tissue) exposed. There is no tunneling or undermining noted. There is a medium amount of serosanguineous drainage noted. The wound margin is flat and intact. There is large (67-100%) red, pink granulation within the wound bed. There is a small (1-33%) amount of necrotic tissue within the wound bed including Adherent Slough. 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 Recommend currently that we going continue with the wound care measures as before reusing clobetasol followed by [facility] which I think is doing a great job. 2. I am also can recommend we have the patient continue with the 4-layer compression wrap which I think is doing a great  job. 3. I would also recommend using some of the cold medicine only irritated/ingrown hair areas I think this should help and if he does have any slight infection the doxycycline he is on for the respiratory issue should help in that regard. We will see patient back for reevaluation in 1 week here in the clinic. If anything worsens or changes patient will contact our office for additional recommendations. Electronic Signature(s) Signed: 06/25/2021 8:52:08 AM By: Worthy Keeler PA-C Entered By: Worthy Keeler on 06/25/2021 08:52:08 JA, [place]MAN ([id number]) -------------------------------------------------------------------------------- SuperBill Details Patient Name: [name] Date of Service: 06/25/2021 Medical Record Number: [id number] Patient Account Number: [id number] Date of Birth/Sex: 06/04/1980 (40 y.o. M) Treating RN: Levora Dredge Primary Care Provider: [name] Other Clinician: Referring Provider: [name] Treating Provider/Extender: [name] Cliche in Treatment: 237 Diagnosis Coding ICD-10 Codes Code Description  I87.2 Venous insufficiency (chronic) (peripheral) L97.222 Non-pressure chronic ulcer of left calf with fat layer exposed E11.622 Type 2 diabetes mellitus with other skin ulcer L88 Pyoderma gangrenosum F17.208 Nicotine dependence, unspecified, with other nicotine-induced disorders Facility Procedures CPT4 Code: 78004471 Description: (Facility Use Only) [phone] - [place] LWR LT LEG Modifier: Quantity: 1 Physician Procedures CPT4 Code: 8548830 Description: 14159 - WC PHYS LEVEL 4 - EST PT Modifier: Quantity: 1 CPT4 Code: Description: ICD-10 Diagnosis Description I87.2 Venous insufficiency (chronic) (peripheral) L97.222 Non-pressure chronic ulcer of left calf with fat layer exposed E11.622 Type 2 diabetes mellitus with other skin ulcer L88 Pyoderma gangrenosum Modifier: Quantity: Electronic Signature(s) Signed: 06/25/2021 8:59:17  AM By: Levora Dredge Signed: 06/26/2021 10:02:31 AM By: Worthy Keeler PA-C Previous Signature: 06/25/2021 8:52:28 AM Version By: Worthy Keeler PA-C Entered By: Levora Dredge on 06/25/2021 08:59:17

## 2021-06-25 NOTE — Progress Notes (Signed)
MCCLAIN, SHALL ([id number]) Visit Report for 06/25/2021 Arrival Information Details Patient Name: [name], [name] Date of Service: 06/25/2021 8:15 AM Medical Record Number: [id number] Patient Account Number: [id number] Date of Birth/Sex: Jun 05, 1980 (41 y.o. M) Treating RN: Levora Dredge Primary Care Jacqulyn Barresi: [name] Other Clinician: Referring Othello Dickenson: [name] Treating Korben Carcione/Extender: [name] Cliche in Treatment: 15 Visit Information History Since Last Visit Added or deleted any medications: No Patient Arrived: Ambulatory Any new allergies or adverse reactions: No Arrival Time: 08:09 Had a fall or experienced change in No Accompanied By: self activities of daily living that may affect Transfer Assistance: None risk of falls: Patient Identification Verified: Yes Hospitalized since last visit: No Secondary Verification Process Completed: Yes Has Dressing in Place as Prescribed: Yes Patient Requires Transmission-Based No Has Compression in Place as Prescribed: Yes Precautions: Pain Present Now: No Patient Has Alerts: Yes Patient Alerts: Patient has reaction to silver dressings. Electronic Signature(s) Signed: 06/25/2021 5:12:29 PM By: Levora Dredge Entered By: Levora Dredge on 06/25/2021 08:12:59 [name] ([id number]) -------------------------------------------------------------------------------- Clinic Level of Care Assessment Details Patient Name: [name] Date of Service: 06/25/2021 8:15 AM Medical Record Number: [id number] Patient Account Number: [id number] Date of Birth/Sex: 09/03/1980 (41 y.o. M) Treating RN: Levora Dredge Primary Care Keeghan Mcintire: [name] Other Clinician: Referring Marston Mccadden: [name] Treating Kahner Yanik/Extender: [name] Cliche in Treatment: [facility] Level of Care Assessment Items TOOL 1 Quantity Score []  - Use when EandM and Procedure is performed on INITIAL visit 0 ASSESSMENTS - Nursing Assessment /  Reassessment []  - General Physical Exam (combine w/ comprehensive assessment (listed just below) when performed on new 0 pt. evals) []  - 0 Comprehensive Assessment (HX, ROS, Risk Assessments, Wounds Hx, etc.) ASSESSMENTS - Wound and Skin Assessment / Reassessment []  - Dermatologic / Skin Assessment (not related to wound area) 0 ASSESSMENTS - Ostomy and/or Continence Assessment and Care []  - Incontinence Assessment and Management 0 []  - 0 Ostomy Care Assessment and Management (repouching, etc.) PROCESS - Coordination of Care []  - Simple Patient / Family Education for ongoing care 0 []  - 0 Complex (extensive) Patient / Family Education for ongoing care []  - 0 Staff obtains Programmer, systems, Records, Test Results / Process Orders []  - 0 Staff telephones HHA, Nursing Homes / Clarify orders / etc []  - 0 Routine Transfer to another Facility (non-emergent condition) []  - 0 Routine Hospital Admission (non-emergent condition) []  - 0 New Admissions / Biomedical engineer / Ordering NPWT, Apligraf, etc. []  - 0 Emergency Hospital Admission (emergent condition) PROCESS - Special Needs []  - Pediatric / Minor Patient Management 0 []  - 0 Isolation Patient Management []  - 0 Hearing / Language / Visual special needs []  - 0 Assessment of Community assistance (transportation, D/C planning, etc.) []  - 0 Additional assistance / Altered mentation []  - 0 Support Surface(s) Assessment (bed, cushion, seat, etc.) INTERVENTIONS - Miscellaneous []  - External ear exam 0 []  - 0 Patient Transfer (multiple staff / Civil Service fast streamer / Similar devices) []  - 0 Simple Staple / Suture removal (25 or less) []  - 0 Complex Staple / Suture removal (26 or more) []  - 0 Hypo/Hyperglycemic Management (do not check if billed separately) []  - 0 Ankle / Brachial Index (ABI) - do not check if billed separately Has the patient been seen at the hospital within the last three years: Yes Total Score: 0 Level Of Care:  ____ [name] ([id number]) Electronic Signature(s) Signed: 06/25/2021 5:12:29 PM By: Levora Dredge Entered By: Levora Dredge on 06/25/2021 08:59:02 Kulpa, Clint ([id number]) --------------------------------------------------------------------------------  Compression Therapy Details Patient Name: [name] Date of Service: 06/25/2021 8:15 AM Medical Record Number: [id number] Patient Account Number: [id number] Date of Birth/Sex: 1980-06-09 (41 y.o. M) Treating RN: Levora Dredge Primary Care Randal Yepiz: [name] Other Clinician: Referring Vikash Nest: [name] Treating Burnell Hurta/Extender: [name] Cliche in Treatment: 237 Compression Therapy Performed for Wound Assessment: Wound #1 Left,Lateral Lower Leg Performed By: Annie Paras, RN Compression Type: Four Layer Pre Treatment ABI: 1.2 Post Procedure Diagnosis Same as Pre-procedure Notes pt tolerated wrap without issue Electronic Signature(s) Signed: 06/25/2021 5:12:29 PM By: Levora Dredge Entered By: Levora Dredge on 06/25/2021 08:55:46 Roettger, [name] ([id number]) -------------------------------------------------------------------------------- Encounter Discharge Information Details Patient Name: [name] Date of Service: 06/25/2021 8:15 AM Medical Record Number: [id number] Patient Account Number: [id number] Date of Birth/Sex: 07/18/1980 (40 y.o. M) Treating RN: Levora Dredge Primary Care Xoe Hoe: [name] Other Clinician: Referring Jamiyla Ishee: [name] Treating Braison Snoke/Extender: [name] Cliche in Treatment: 237 Encounter Discharge Information Items Discharge Condition: Stable Ambulatory Status: Ambulatory Discharge Destination: Home Transportation: Private Auto Accompanied By: self Schedule Follow-up Appointment: Yes Clinical Summary of Care: Electronic Signature(s) Signed: 06/25/2021 9:00:56 AM By: Levora Dredge Entered By: Levora Dredge on 06/25/2021  [place], Hung ([id number]) -------------------------------------------------------------------------------- Lower Extremity Assessment Details Patient Name: [name] Date of Service: 06/25/2021 8:15 AM Medical Record Number: [id number] Patient Account Number: [id number] Date of Birth/Sex: 23-Aug-1980 (40 y.o. M) Treating RN: Levora Dredge Primary Care Alaysha Jefcoat: [name] Other Clinician: Referring Shanikia Kernodle: [name] Treating Shadee Montoya/Extender: [name] Cliche in Treatment: 237 Edema Assessment Assessed: [Left: No] [Right: No] [Left: Edema] [Right: :] Calf Left: Right: Point of Measurement: 35 cm From Medial Instep 48 cm Ankle Left: Right: Point of Measurement: 10 cm From Medial Instep 29.5 cm Vascular Assessment Pulses: Dorsalis Pedis Palpable: [Left:Yes] Electronic Signature(s) Signed: 06/25/2021 5:12:29 PM By: Levora Dredge Entered By: Levora Dredge on 06/25/2021 08:29:16 Mohammad, [name] ([id number]) -------------------------------------------------------------------------------- Multi Wound Chart Details Patient Name: [name] Date of Service: 06/25/2021 8:15 AM Medical Record Number: [id number] Patient Account Number: [id number] Date of Birth/Sex: December 15, 1980 (40 y.o. M) Treating RN: Levora Dredge Primary Care Deshanae Lindo: [name] Other Clinician: Referring Tin Engram: [name] Treating Drinda Belgard/Extender: [name] Cliche in Treatment: 237 Vital Signs Height(in): 71 Pulse(bpm): 67 Weight(lbs): 338 Blood Pressure(mmHg): 126/86 Body Mass Index(BMI): 47.1 Temperature(F): 97.8 Respiratory Rate(breaths/min): 18 Photos: [N/A:N/A] Wound Location: Left, Lateral Lower Leg N/A N/A Wounding Event: Gradually Appeared N/A N/A Primary Etiology: Pyoderma N/A N/A Comorbid History: Sleep Apnea, Hypertension, Colitis N/A N/A Date Acquired: 11/18/2015 N/A N/A Weeks of Treatment: 237 N/A N/A Wound Status: Open N/A N/A Wound Recurrence:  No N/A N/A Measurements L x W x D (cm) 2.4x1.5x0.1 N/A N/A Area (cm) : 2.827 N/A N/A Volume (cm) : 0.283 N/A N/A % Reduction in Area: 42.40% N/A N/A % Reduction in Volume: 92.80% N/A N/A Classification: Full Thickness With Exposed N/A N/A Support Structures Exudate Amount: Medium N/A N/A Exudate Type: Serosanguineous N/A N/A Exudate Color: red, brown N/A N/A Wound Margin: Flat and Intact N/A N/A Granulation Amount: Large (67-100%) N/A N/A Granulation Quality: Red, Pink N/A N/A Necrotic Amount: Small (1-33%) N/A N/A Exposed Structures: Fat Layer (Subcutaneous Tissue): N/A N/A Yes Fascia: No Tendon: No Muscle: No Joint: No Bone: No Epithelialization: Medium (34-66%) N/A N/A Treatment Notes Electronic Signature(s) Signed: 06/25/2021 5:12:29 PM By: Levora Dredge Entered By: Levora Dredge on 06/25/2021 08:55:11 [name] ([id number]) -------------------------------------------------------------------------------- Multi-Disciplinary Care Plan Details Patient Name: [name] Date of Service: 06/25/2021 8:15 AM Medical Record Number: [id number] Patient Account Number:  [id number] Date of Birth/Sex: 1981-03-20 (41 y.o. M) Treating RN: Levora Dredge Primary Care Maila Dukes: [name] Other Clinician: Referring Carletha Dawn: [name] Treating Verdis Koval/Extender: [name] Cliche in Treatment: [place] reviewed with physician Active Inactive Necrotic Tissue Nursing Diagnoses: Impaired tissue integrity related to necrotic/devitalized tissue Knowledge deficit related to management of necrotic/devitalized tissue Goals: Necrotic/devitalized tissue will be minimized in the wound bed Date Initiated: 03/26/2021 Target Resolution Date: 05/26/2021 Goal Status: Active Patient/caregiver will verbalize understanding of reason and process for debridement of necrotic tissue Date Initiated: 03/26/2021 Date Inactivated: 04/27/2021 Target Resolution  Date: 03/26/2021 Goal Status: Met Interventions: Assess patient pain level pre-, during and post procedure and prior to discharge Provide education on necrotic tissue and debridement process Treatment Activities: Apply topical anesthetic as ordered : 03/26/2021 Notes: Electronic Signature(s) Signed: 06/25/2021 5:12:29 PM By: Levora Dredge Entered By: Levora Dredge on 06/25/2021 08:54:45 Harnois, Simpson ([id number]) -------------------------------------------------------------------------------- Pain Assessment Details Patient Name: [name] Date of Service: 06/25/2021 8:15 AM Medical Record Number: [id number] Patient Account Number: [id number] Date of Birth/Sex: December 09, 1980 (40 y.o. M) Treating RN: Levora Dredge Primary Care Itxel Wickard: [name] Other Clinician: Referring Takashi Korol: [name] Treating Kaytlynne Neace/Extender: [name] Cliche in Treatment: 237 Active Problems Location of Pain Severity and Description of Pain Patient Has Paino No Site Locations Rate the pain. Current Pain Level: 0 Pain Management and Medication Current Pain Management: Electronic Signature(s) Signed: 06/25/2021 5:12:29 PM By: Levora Dredge Entered By: Levora Dredge on 06/25/2021 08:15:39 [name] ([id number]) -------------------------------------------------------------------------------- Patient/Caregiver Education Details Patient Name: [name] Date of Service: 06/25/2021 8:15 AM Medical Record Number: [id number] Patient Account Number: [id number] Date of Birth/Gender: 1980/08/14 (40 y.o. M) Treating RN: Levora Dredge Primary Care Physician: [name] Other Clinician: Referring Physician: [name] Treating Physician/Extender: [name] Cliche in Treatment: 237 Education Assessment Education Provided To: Patient Education Topics Provided Wound/Skin Impairment: Handouts: Caring for Your Ulcer Methods: Explain/Verbal Responses: State content  correctly Electronic Signature(s) Signed: 06/25/2021 5:12:29 PM By: Levora Dredge Entered By: Levora Dredge on 06/25/2021 08:59:55 [name] ([id number]) -------------------------------------------------------------------------------- Wound Assessment Details Patient Name: [name] Date of Service: 06/25/2021 8:15 AM Medical Record Number: [id number] Patient Account Number: [id number] Date of Birth/Sex: 06-03-1980 (40 y.o. M) Treating RN: Levora Dredge Primary Care Milledge Gerding: [name] Other Clinician: Referring Mead Slane: [name] Treating Davan Hark/Extender: [name] Cliche in Treatment: 237 Wound Status Wound Number: 1 Primary Etiology: Pyoderma Wound Location: Left, Lateral Lower Leg Wound Status: Open Wounding Event: Gradually Appeared Comorbid History: Sleep Apnea, Hypertension, Colitis Date Acquired: 11/18/2015 Weeks Of Treatment: 237 Clustered Wound: No Photos Wound Measurements Length: (cm) 2.4 Width: (cm) 1.5 Depth: (cm) 0.1 Area: (cm) 2.827 Volume: (cm) 0.283 % Reduction in Area: 42.4% % Reduction in Volume: 92.8% Epithelialization: Medium (34-66%) Tunneling: No Undermining: No Wound Description Classification: Full Thickness With Exposed Support Structures Wound Margin: Flat and Intact Exudate Amount: Medium Exudate Type: Serosanguineous Exudate Color: red, brown Foul Odor After Cleansing: No Slough/Fibrino Yes Wound Bed Granulation Amount: Large (67-100%) Exposed Structure Granulation Quality: Red, Pink Fascia Exposed: No Necrotic Amount: Small (1-33%) Fat Layer (Subcutaneous Tissue) Exposed: Yes Necrotic Quality: Adherent Slough Tendon Exposed: No Muscle Exposed: No Joint Exposed: No Bone Exposed: No Treatment Notes Wound #1 (Lower Leg) Wound Laterality: Left, Lateral Cleanser Wound Cleanser Discharge Instruction: Wash your hands with soap and water. Remove old dressing, discard into plastic bag and place into  trash. Cleanse the wound with Wound Cleanser prior to applying a clean dressing using gauze sponges, not tissues or  cotton balls. Do not scrub or use excessive force. Pat dry using gauze sponges, not tissue or cotton balls. NATHANIEL, WAKELEY ([id number]) Siloam Lotion Discharge Instruction: Suggestions: Theraderm, Eucerin, Cetaphil, or patient preference. Topical Clobetasol Propionate ointment 0.05%, 60 (g) tube Discharge Instruction: Pt to bring to appt- Triamcinolone Acetonide Cream, 0.1%, 15 (g) tube Discharge Instruction: Apply as directed by [name] Main. To leg not on wound Primary Dressing Hydrofera Blue Ready Transfer Foam, 4x5 (in/in) Discharge Instruction: Apply Hydrofera Blue Ready to wound bed as directed Secondary Dressing Zetuvit Plus Silicone Non-bordered 5x5 (in/in) Secured With Compression Wrap Medichoice 4 layer Compression System, 35-40 mmHG Discharge Instruction: Apply multi-layer wrap as directed. Compression Stockings Add-Ons Electronic Signature(s) Signed: 06/25/2021 5:12:29 PM By: Levora Dredge Entered By: Levora Dredge on 06/25/2021 08:28:02 KAYVEN, ALDACO ([id number]) -------------------------------------------------------------------------------- Vitals Details Patient Name: [name] Date of Service: 06/25/2021 8:15 AM Medical Record Number: [id number] Patient Account Number: [id number] Date of Birth/Sex: [birth year] (40 y.o. M) Treating RN: Levora Dredge Primary Care Floy Angert: [name] Other Clinician: Referring Oleta Gunnoe: [name] Treating Candice Lunney/Extender: [name] Cliche in Treatment: 237 Vital Signs Time Taken: 08:13 Temperature (F): 97.8 Height (in): 71 Pulse (bpm): 67 Weight (lbs): 338 Respiratory Rate (breaths/min): 18 Body Mass Index (BMI): 47.1 Blood Pressure (mmHg): 126/86 Reference Range: 80 - 120 mg / dl Electronic Signature(s) Signed: 06/25/2021 5:12:29 PM By: Levora Dredge Entered By: Levora Dredge on 06/25/2021 08:15:20

## 2021-06-27 ENCOUNTER — Other Ambulatory Visit: Payer: Self-pay

## 2021-06-27 DIAGNOSIS — I872 Venous insufficiency (chronic) (peripheral): Secondary | ICD-10-CM | POA: Diagnosis not present

## 2021-06-27 NOTE — Progress Notes (Signed)
[name] ([id number]) Visit Report for 06/27/2021 Arrival Information Details Patient Name: [name] Date of Service: 06/27/2021 8:00 AM Medical Record Number: [id number] Patient Account Number: [id number] Date of Birth/Sex: Nov 19, 1980 (41 y.o. M) Treating RN: Donnamarie Poag Primary Care Nolita Kutter: [name] Other Clinician: Referring Shanette Tamargo: [name] Treating Rhianna Raulerson/Extender: Yaakov Guthrie in Treatment: 47 Visit Information History Since Last Visit Added or deleted any medications: No Patient Arrived: Ambulatory Had a fall or experienced change in No Arrival Time: 08:05 activities of daily living that may affect Accompanied By: self risk of falls: Transfer Assistance: None Hospitalized since last visit: No Patient Identification Verified: Yes Has Dressing in Place as Prescribed: Yes Secondary Verification Process Completed: Yes Has Compression in Place as Prescribed: Yes Patient Requires Transmission-Based No Pain Present Now: No Precautions: Patient Has Alerts: Yes Patient Alerts: Patient has reaction to silver dressings. Electronic Signature(s) Signed: 06/27/2021 4:26:12 PM By: Donnamarie Poag Entered By: Donnamarie Poag on 06/27/2021 08:30:23 [name] ([id number]) -------------------------------------------------------------------------------- Compression Therapy Details Patient Name: [name] Date of Service: 06/27/2021 8:00 AM Medical Record Number: [id number] Patient Account Number: [id number] Date of Birth/Sex: 1981/01/29 (40 y.o. M) Treating RN: Donnamarie Poag Primary Care Marjarie Irion: [name] Other Clinician: Referring Tida Saner: [name] Treating Dara Camargo/Extender: Yaakov Guthrie in Treatment: 238 Compression Therapy Performed for Wound Assessment: Wound #1 Left,Lateral Lower Leg Performed By: Junius Argyle, RN Compression Type: Four Layer Electronic Signature(s) Signed: 06/27/2021 4:26:12 PM By: Donnamarie Poag Entered By: Donnamarie Poag on 06/27/2021 08:35:10 [name] ([id number]) -------------------------------------------------------------------------------- Encounter Discharge Information Details Patient Name: [name] Date of Service: 06/27/2021 8:00 AM Medical Record Number: [id number] Patient Account Number: [id number] Date of Birth/Sex: 1981-05-06 (40 y.o. M) Treating RN: Donnamarie Poag Primary Care Ailany Koren: [name] Other Clinician: Referring Danne Vasek: [name] Treating Musa Rewerts/Extender: Yaakov Guthrie in Treatment: 238 Encounter Discharge Information Items Discharge Condition: Stable Ambulatory Status: Ambulatory Discharge Destination: Home Transportation: Private Auto Accompanied By: self Schedule Follow-up Appointment: Yes Clinical Summary of Care: Electronic Signature(s) Signed: 06/27/2021 8:38:32 AM By: Donnamarie Poag Entered By: Donnamarie Poag on 06/27/2021 08:38:32 [name] ([id number]) -------------------------------------------------------------------------------- Wound Assessment Details Patient Name: [name] Date of Service: 06/27/2021 8:00 AM Medical Record Number: [id number] Patient Account Number: [id number] Date of Birth/Sex: 05-09-1981 (40 y.o. M) Treating RN: Donnamarie Poag Primary Care Annella Prowell: [name] Other Clinician: Referring Camya Haydon: [name] Treating Brynlee Pennywell/Extender: Yaakov Guthrie in Treatment: 238 Wound Status Wound Number: 1 Primary Etiology: Pyoderma Wound Location: Left, Lateral Lower Leg Wound Status: Open Wounding Event: Gradually Appeared Comorbid History: Sleep Apnea, Hypertension, Colitis Date Acquired: 11/18/2015 Weeks Of Treatment: 238 Clustered Wound: No Wound Measurements Length: (cm) 2.4 Width: (cm) 1.5 Depth: (cm) 0.1 Area: (cm) 2.827 Volume: (cm) 0.283 % Reduction in Area: 42.4% % Reduction in Volume: 92.8% Epithelialization: Medium (34-66%) Wound  Description Classification: Full Thickness With Exposed Support Structures Wound Margin: Flat and Intact Exudate Amount: Medium Exudate Type: Serosanguineous Exudate Color: red, brown Foul Odor After Cleansing: No Slough/Fibrino Yes Wound Bed Granulation Amount: Large (67-100%) Exposed Structure Granulation Quality: Red, Pink Fascia Exposed: No Necrotic Amount: Small (1-33%) Fat Layer (Subcutaneous Tissue) Exposed: Yes Necrotic Quality: Adherent Slough Tendon Exposed: No Muscle Exposed: No Joint Exposed: No Bone Exposed: No Treatment Notes Wound #1 (Lower Leg) Wound Laterality: Left, Lateral Cleanser Wound Cleanser Discharge Instruction: Wash your hands with soap and water. Remove old dressing, discard into plastic bag and place into trash. Cleanse the wound with Wound Cleanser prior to applying a clean dressing using gauze sponges,  not tissues or cotton balls. Do not scrub or use excessive force. Pat dry using gauze sponges, not tissue or cotton balls. Peri-Wound Care Moisturizing Lotion Discharge Instruction: Suggestions: Theraderm, Eucerin, Cetaphil, or patient preference. Topical Clobetasol Propionate ointment 0.05%, 60 (g) tube Discharge Instruction: Pt to bring to appt- Triamcinolone Acetonide Cream, 0.1%, 15 (g) tube Discharge Instruction: Apply as directed by [name]. To leg not on wound Primary Dressing Hydrofera Blue Ready Transfer Foam, 4x5 (in/in) Discharge Instruction: Apply Hydrofera Blue Ready to wound bed as directed [name] ([id number]) Secondary Dressing Zetuvit Plus Silicone Non-bordered 5x5 (in/in) Secured With Compression Wrap Medichoice 4 layer Compression System, 35-40 mmHG Discharge Instruction: Apply multi-layer wrap as directed. Compression Stockings Add-Ons Electronic Signature(s) Signed: 06/27/2021 4:26:12 PM By: Donnamarie Poag Entered ByDonnamarie Poag on 06/27/2021 08:34:49

## 2021-06-27 NOTE — Progress Notes (Signed)
HILMON, GRADNEY ([id number]) Visit Report for 06/27/2021 Physician Orders Details Patient Name: [name], [name] Date of Service: 06/27/2021 8:00 AM Medical Record Number: [id number] Patient Account Number: [id number] Date of Birth/Sex: 07-20-1980 (41 y.o. M) Treating RN: Donnamarie Poag Primary Care Provider: [name] Other Clinician: Referring Provider: [name] Treating Provider/Extender: Yaakov Guthrie in Treatment: [phone] Verbal / Phone Orders: No Diagnosis Coding Follow-up Appointments o Return Appointment in 1 week. o Nurse Visit as needed - twice a week Bathing/ Shower/ Hygiene o Clean wound with Normal Saline or wound cleanser. o May shower with wound dressing protected with water repellent cover or cast protector. Edema Control - Lymphedema / Segmental Compressive Device / Other o Optional: One layer of unna paste to top of compression wrap (to act as an anchor). o Elevate, Exercise Daily and Avoid Standing for Long Periods of Time. o Elevate legs to the level of the heart and pump ankles as often as possible o Elevate leg(s) parallel to the floor when sitting. Wound Treatment Wound #1 - Lower Leg Wound Laterality: Left, Lateral Cleanser: Wound Cleanser 3 x Per Week/30 Days Discharge Instructions: Wash your hands with soap and water. Remove old dressing, discard into plastic bag and place into trash. Cleanse the wound with Wound Cleanser prior to applying a clean dressing using gauze sponges, not tissues or cotton balls. Do not scrub or use excessive force. Pat dry using gauze sponges, not tissue or cotton balls. Peri-Wound Care: Moisturizing Lotion 3 x Per Week/30 Days Discharge Instructions: Suggestions: Theraderm, Eucerin, Cetaphil, or patient preference. Topical: Clobetasol Propionate ointment 0.05%, 60 (g) tube 3 x Per Week/30 Days Discharge Instructions: Pt to bring to appt- Topical: Triamcinolone Acetonide Cream, 0.1%, 15 (g) tube 3 x Per  Week/30 Days Discharge Instructions: Apply as directed by provider. To leg not on wound Primary Dressing: Hydrofera Blue Ready Transfer Foam, 4x5 (in/in) 3 x Per Week/30 Days Discharge Instructions: Apply Hydrofera Blue Ready to wound bed as directed Secondary Dressing: Zetuvit Plus Silicone Non-bordered 5x5 (in/in) 3 x Per Week/30 Days Compression Wrap: Medichoice 4 layer Compression System, 35-40 mmHG (Generic) 3 x Per Week/30 Days Discharge Instructions: Apply multi-layer wrap as directed. Electronic Signature(s) Signed: 06/27/2021 4:26:12 PM By: Donnamarie Poag Signed: 06/27/2021 4:30:46 PM By: Kalman Shan DO Entered By: Donnamarie Poag on 06/27/2021 08:35:44 Missildine, [name] ([id number]) -------------------------------------------------------------------------------- [place] Details Patient Name: [name] Date of Service: 06/27/2021 Medical Record Number: [id number] Patient Account Number: [id number] Date of Birth/Sex: [birth year] (40 y.o. 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

## 2021-06-29 ENCOUNTER — Other Ambulatory Visit: Payer: Self-pay

## 2021-06-29 ENCOUNTER — Ambulatory Visit: Payer: 59 | Admitting: Primary Care

## 2021-06-29 DIAGNOSIS — I872 Venous insufficiency (chronic) (peripheral): Secondary | ICD-10-CM | POA: Diagnosis not present

## 2021-06-29 NOTE — Progress Notes (Signed)
DALEN, HENNESSEE ([id number]) Visit Report for 06/29/2021 Arrival Information Details Patient Name: [name], [name] Date of Service: 06/29/2021 8:00 AM Medical Record Number: [id number] Patient Account Number: [id number] Date of Birth/Sex: 1981-04-25 (41 y.o. M) Treating RN: Donnamarie Poag Primary Care Diago Haik: [name] Other Clinician: Referring Nicholas Trompeter: [name] Treating Rana Adorno/Extender: [name] Cliche in Treatment: 1 Visit Information History Since Last Visit Added or deleted any medications: No Patient Arrived: Ambulatory Had a fall or experienced change in No Arrival Time: 08:03 activities of daily living that may affect Accompanied By: self risk of falls: Transfer Assistance: None Hospitalized since last visit: No Patient Identification Verified: Yes Has Dressing in Place as Prescribed: Yes Secondary Verification Process Completed: Yes Has Compression in Place as Prescribed: Yes Patient Requires Transmission-Based No Pain Present Now: No Precautions: Patient Has Alerts: Yes Patient Alerts: Patient has reaction to silver dressings. Electronic Signature(s) Signed: 06/29/2021 11:34:54 AM By: Donnamarie Poag Entered By: Donnamarie Poag on 06/29/2021 08:39:28 [name] ([id number]) -------------------------------------------------------------------------------- Compression Therapy Details Patient Name: [name] Date of Service: 06/29/2021 8:00 AM Medical Record Number: [id number] Patient Account Number: [id number] Date of Birth/Sex: 1980-06-24 (40 y.o. M) Treating RN: Donnamarie Poag Primary Care Jahlil Ziller: [name] Other Clinician: Referring Alin Hutchins: [name] Treating Verdell Kincannon/Extender: [name] Cliche in Treatment: 238 Compression Therapy Performed for Wound Assessment: Wound #1 Left,Lateral Lower Leg Performed By: Junius Argyle, RN Compression Type: Four Layer Electronic Signature(s) Signed: 06/29/2021 11:34:54 AM By: Donnamarie Poag Entered By: Donnamarie Poag on 06/29/2021 08:44:52 Sandate, [name] ([id number]) -------------------------------------------------------------------------------- Encounter Discharge Information Details Patient Name: [name] Date of Service: 06/29/2021 8:00 AM Medical Record Number: [id number] Patient Account Number: [id number] Date of Birth/Sex: 08/11/1980 (40 y.o. M) Treating RN: Donnamarie Poag Primary Care Maurico Perrell: [name] Other Clinician: Referring Blayde Bacigalupi: [name] Treating Sylvan Lahm/Extender: [name] Cliche in Treatment: 238 Encounter Discharge Information Items Discharge Condition: Stable Ambulatory Status: Ambulatory Discharge Destination: Home Transportation: Private Auto Accompanied By: self Schedule Follow-up Appointment: Yes Clinical Summary of Care: Electronic Signature(s) Signed: 06/29/2021 11:34:54 AM By: Donnamarie Poag Entered By: Donnamarie Poag on 06/29/2021 08:46:26 [place], [name] ([id number]) -------------------------------------------------------------------------------- Wound Assessment Details Patient Name: [name] Date of Service: 06/29/2021 8:00 AM Medical Record Number: [id number] Patient Account Number: [id number] Date of Birth/Sex: 1980-12-20 (40 y.o. M) Treating RN: Donnamarie Poag Primary Care Antonela Freiman: [name] Other Clinician: Referring Kemani Demarais: [name] Treating Ailine Hefferan/Extender: [name] Cliche in Treatment: 238 Wound Status Wound Number: 1 Primary Etiology: Pyoderma Wound Location: Left, Lateral Lower Leg Wound Status: Open Wounding Event: Gradually Appeared Comorbid History: Sleep Apnea, Hypertension, Colitis Date Acquired: 11/18/2015 Weeks Of Treatment: 238 Clustered Wound: No Wound Measurements Length: (cm) 2.4 Width: (cm) 1.5 Depth: (cm) 0.1 Area: (cm) 2.827 Volume: (cm) 0.283 % Reduction in Area: 42.4% % Reduction in Volume: 92.8% Epithelialization: Medium (34-66%) Wound Description Classification:  Full Thickness With Exposed Support Structures Wound Margin: Flat and Intact Exudate Amount: Medium Exudate Type: Serosanguineous Exudate Color: red, brown Foul Odor After Cleansing: No Slough/Fibrino Yes Wound Bed Granulation Amount: Large (67-100%) Exposed Structure Granulation Quality: Red, Pink Fascia Exposed: No Necrotic Amount: Small (1-33%) Fat Layer (Subcutaneous Tissue) Exposed: Yes Necrotic Quality: Adherent Slough Tendon Exposed: No Muscle Exposed: No Joint Exposed: No Bone Exposed: No Treatment Notes Wound #1 (Lower Leg) Wound Laterality: Left, Lateral Cleanser Wound Cleanser Discharge Instruction: Wash your hands with soap and water. Remove old dressing, discard into plastic bag and place into trash. Cleanse the wound with Wound Cleanser prior to applying a clean dressing using gauze sponges,  not tissues or cotton balls. Do not scrub or use excessive force. Pat dry using gauze sponges, not tissue or cotton balls. Peri-Wound Care Moisturizing Lotion Discharge Instruction: Suggestions: Theraderm, Eucerin, Cetaphil, or patient preference. Topical Clobetasol Propionate ointment 0.05%, 60 (g) tube Discharge Instruction: Pt to bring to appt- Triamcinolone Acetonide Cream, 0.1%, 15 (g) tube Discharge Instruction: Apply as directed by Lamika Connolly. To leg not on wound Primary Dressing Hydrofera Blue Ready Transfer Foam, 4x5 (in/in) Discharge Instruction: Apply Hydrofera Blue Ready to wound bed as directed [place], [place] ([id number]) Secondary Dressing Zetuvit Plus Silicone Non-bordered 5x5 (in/in) Secured With Compression Wrap Medichoice 4 layer Compression System, 35-40 mmHG Discharge Instruction: Apply multi-layer wrap as directed. Compression Stockings Add-Ons Electronic Signature(s) Signed: 06/29/2021 11:34:54 AM By: Donnamarie Poag Entered ByDonnamarie Poag on 06/29/2021 08:44:08

## 2021-06-29 NOTE — Progress Notes (Signed)
RANVIR, RENOVATO ([id number]) Visit Report for 06/29/2021 Physician Orders Details Patient Name: [name], [name] Date of Service: 06/29/2021 8:00 AM Medical Record Number: [id number] Patient Account Number: [id number] Date of Birth/Sex: 07/10/1980 (41 y.o. M) Treating RN: Donnamarie Poag Primary Care Provider: [name] Other Clinician: Referring Provider: [name] Treating Provider/Extender: [name] Cliche in Treatment: [phone] Verbal / Phone Orders: No Diagnosis Coding Follow-up Appointments o Return Appointment in 1 week. o Nurse Visit as needed - twice a week Bathing/ Shower/ Hygiene o Clean wound with Normal Saline or wound cleanser. o May shower with wound dressing protected with water repellent cover or cast protector. Edema Control - Lymphedema / Segmental Compressive Device / Other o Optional: One layer of unna paste to top of compression wrap (to act as an anchor). o Elevate, Exercise Daily and Avoid Standing for Long Periods of Time. o Elevate legs to the level of the heart and pump ankles as often as possible o Elevate leg(s) parallel to the floor when sitting. Wound Treatment Wound #1 - Lower Leg Wound Laterality: Left, Lateral Cleanser: Wound Cleanser 3 x Per Week/30 Days Discharge Instructions: Wash your hands with soap and water. Remove old dressing, discard into plastic bag and place into trash. Cleanse the wound with Wound Cleanser prior to applying a clean dressing using gauze sponges, not tissues or cotton balls. Do not scrub or use excessive force. Pat dry using gauze sponges, not tissue or cotton balls. Peri-Wound Care: Moisturizing Lotion 3 x Per Week/30 Days Discharge Instructions: Suggestions: Theraderm, Eucerin, Cetaphil, or patient preference. Topical: Clobetasol Propionate ointment 0.05%, 60 (g) tube 3 x Per Week/30 Days Discharge Instructions: Pt to bring to appt- Topical: Triamcinolone Acetonide Cream, 0.1%, 15 (g) tube 3 x Per Week/30  Days Discharge Instructions: Apply as directed by provider. To leg not on wound Primary Dressing: Hydrofera Blue Ready Transfer Foam, 4x5 (in/in) 3 x Per Week/30 Days Discharge Instructions: Apply Hydrofera Blue Ready to wound bed as directed Secondary Dressing: Zetuvit Plus Silicone Non-bordered 5x5 (in/in) 3 x Per Week/30 Days Compression Wrap: Medichoice 4 layer Compression System, 35-40 mmHG (Generic) 3 x Per Week/30 Days Discharge Instructions: Apply multi-layer wrap as directed. Electronic Signature(s) Signed: 06/29/2021 11:34:54 AM By: Donnamarie Poag Signed: 06/29/2021 4:49:47 PM By: Worthy Keeler PA-C Entered By: Donnamarie Poag on 06/29/2021 08:45:35 Justice, [name] ([id number]) -------------------------------------------------------------------------------- [place] Details Patient Name: [name] Date of Service: 06/29/2021 Medical Record Number: [id number] Patient Account Number: [id number] Date of Birth/Sex: 01-20-1981 (40 y.o. 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

## 2021-07-02 ENCOUNTER — Other Ambulatory Visit: Payer: Self-pay

## 2021-07-02 ENCOUNTER — Encounter: Payer: 59 | Admitting: Physician Assistant

## 2021-07-02 DIAGNOSIS — I872 Venous insufficiency (chronic) (peripheral): Secondary | ICD-10-CM | POA: Diagnosis not present

## 2021-07-02 NOTE — Progress Notes (Addendum)
DUWAN, ADRIAN ([id number]) Visit Report for 07/02/2021 Chief Complaint Document Details Patient Name: [name], [name] Date of Service: 07/02/2021 8:15 AM Medical Record Number: [id number] Patient Account Number: [id number] Date of Birth/Sex: 1981-04-16 (41 y.o. 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 M) Treating RN: Levora Dredge Primary Care Provider: [name] Other Clinician: Referring Provider: [name] Treating Provider/Extender: [name] Cliche in Treatment: 238 History of Present Illness HPI Description: 12/04/16; 41 year old man who comes into the clinic today for review of a wound on the posterior left calf. He tells me that is been there for about a year. He is not a diabetic he does smoke half a pack per day. He was seen in the ER on 11/20/16 felt to have cellulitis around the wound and was given clindamycin. An x-ray did not show osteomyelitis. The patient initially tells me that he has a milk allergy that sets off a pruritic itching rash on his lower legs which she scratches incessantly and he thinks that's what may have set up the wound. He has been using various topical antibiotics and ointments without any effect. He works in a [employer] and is on his feet all day. He does not have a  prior history of wounds however he does have the rash on both lower legs the right arm and the ventral aspect of his left arm. These are excoriations and clearly have had scratching however there are of macular looking areas on both legs including a substantial larger area on the right leg. This does not have an underlying open area. There is no blistering. The patient tells me that 2 years ago in [place] in response to the rash on his legs he saw a dermatologist who told him he had a condition which may be pyoderma gangrenosum although I may be putting words into his mouth. He seemed to recognize this. On further questioning he admits to a 5 year history of quiesced. ulcerative colitis. He is not in any treatment for this. He's had no recent travel 12/11/16; the patient arrives today with his wound and roughly the same condition we've been using silver alginate this is a deep punched out wound with some surrounding erythema but no tenderness. Biopsy I did did not show confirmed pyoderma gangrenosum suggested nonspecific inflammation and vasculitis but does not provide an actual description of what was seen by the pathologist. I'm really not able to understand this We have also received information from the patient's dermatologist in [place] notes from April 2016. This was a doctor Agarwal-antal. 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 He was prescribed topical steroid high potency under occlusion which helped but at this point the patient did not have a deep punched out wound. 12/18/16; the patient's wound is larger in terms of surface area however this surface looks better and there is less depth. The surrounding erythema also is better. The patient states that the wrap we put on came off 2 days ago when he has been using his compression stockings.  He we are in the process of getting a dermatology consult. 12/26/16 on evaluation today patient's left lower extremity wound shows evidence of  infection with surrounding erythema noted. He has been tolerating the dressing changes but states that he has noted more discomfort. There is a larger area of erythema surrounding the wound. No fevers, chills, nausea, or vomiting noted at this time. With that being said the wound still does have slough covering the surface. He is not allergic to any medication that he is aware of at this point. In regard to his right lower extremity he had several regions that are erythematous and pruritic he wonders if there's anything we can do to help that. 01/02/17 I reviewed patient's wound culture which was obtained his visit last week. He was placed on doxycycline at that point. Unfortunately that does not appear to be an antibiotic that would likely help with the situation however the pseudomonas noted on culture is sensitive to Cipro. Also unfortunately patient's wound seems to have a large compared to last week's evaluation. Not severely so but there are definitely increased measurements in general. He is continuing to have discomfort as well he writes this to be a seven out of 10. In fact he would prefer me not to perform any debridement today due to the fact that he is having discomfort and considering he has an active infection on the little reluctant to do so anyway. No fevers, chills, nausea, or vomiting noted at this time. 01/08/17; patient seems dermatology on September 5. I suspect dermatology will want the slides from the biopsy I did sent to their pathologist. I'm not sure if there is a way we can expedite that. In any case the culture I did before I left on vacation 3 weeks ago showed Pseudomonas he was given 10 days of Cipro and per her description of her intake nurses is actually somewhat better this week although the wound is quite a bit bigger than I remember the last time I saw this. He still has 3 more days of Cipro 01/21/17; dermatology appointment tomorrow. He has completed the ciprofloxacin  for Pseudomonas. Surface of the wound looks better however he is had some deterioration in the lesions on his right leg. Meantime the left lateral leg wound we will continue with sample 01/29/17; patient had his dermatology appointment but I can't yet see that note. He is completed his antibiotics. The wound is more superficial but considerably larger in circumferential area than when he came in. This is in his left lateral calf. He also has swollen erythematous areas with superficial wounds on the right leg and small papular areas on both arms. There apparently areas in her his upper thighs and buttocks I did not look at those. Dermatology biopsied the right leg. Hopefully will have their input next week. 02/05/17; patient went back to see his dermatologist who told him that he had a "scratching problem" as well as staph. He is now on a 30 day course of doxycycline and I believe she gave him triamcinolone cream to the right leg areas to help with the itching [not exactly sure but probably triamcinolone]. She apparently looked at the left lateral leg wound although this was not rebiopsied and I think felt to be ultimately part of the same pathogenesis. He is using sample border foam and changing nevus himself. He now has a new open area on the right posterior leg which was his biopsy site I don't have any of the dermatology notes  02/12/17; we put the patient in compression last week with SANTYL to the wound on the left leg and the biopsy. Edema is much better and the depth of the wound is now at level of skin. Area is still the same oBiopsy site on the right lateral leg we've also been using santyl with a border foam dressing and he is changing this himself. 02/19/17; Using silver alginate started last week to both the substantial left leg wound and the biopsy site on the right wound. He is tolerating compression well. Has a an appointment with his primary M.D. tomorrow wondering about diuretics although  I'm wondering if the edema problem is actually lymphedema 02/26/17; the patient has been to see his primary doctor Dr. Jerrel Ivory at [facility]. She started him on Lasix 20 mg and this seems to have helped with the edema. However we are not making substantial change with the left lateral calf wound and inflammation. The biopsy site on the right leg also looks stable but not really all that different. 03/12/17; the patient has been to see vein and vascular Dr. Lucky Cowboy. He has had venous reflux studies I have not reviewed these. I did get a call from his dermatology office. They felt that he might have pathergy based on their biopsy on his right leg which led them to look at the slides of JAHMIL, MACLEOD ([id number]) the biopsy I did on the left leg and they wonder whether this represents pyoderma gangrenosum which was the original supposition in a man with ulcerative colitis albeit inactive for many years. They therefore recommended clobetasol and tetracycline i.e. aggressive treatment for possible pyoderma gangrenosum. 03/26/17; apparently the patient just had reflux studies not an appointment with Dr. dew. She arrives in clinic today having applied clobetasol for 2-3 weeks. He notes over the last 2-3 days excessive drainage having to change the dressing 3-4 times a day and also expanding erythema. 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 Lillia Carmel at [facility] dermatology locally who kindly saw him at my request. 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 My concern was the degree of erythema medially extending from his left leg wound which was either inflammation from pyoderma  or cellulitis. I put him on Augmentin however culture of the wound showed Pseudomonas which is quinolone sensitive. I really don't believe he has cellulitis however in view of everything I will continue and give him a course of Cipro. He is also on doxycycline as an immune modulator for the pyoderma. In addition to his original wound on the left lateral leg with surrounding erythema he has a wound on the right posterior calf which was an original biopsy site done by dermatology. This was felt to represent pathergy from pyoderma gangrenosum 04/16/17; pyoderma gangrenosum. Saw Dr. Lillia Carmel yesterday. He has been using topical antibiotics to both wound areas his original wound on the left and the biopsies/pathergy area on the right. There is definitely some improvement in the inflammation around the wound on the right although the patient states he has increasing sensitivity of the wounds. He is on prednisone 60 and doxycycline 1 as prescribed by Dr. Lillia Carmel. He is covering the topical antibiotic with gauze  and putting this in his own compression stocks and changing this daily. He states that Dr. Lottie Rater did a culture of the left leg wound yesterday 05/07/17; pyoderma gangrenosum. The patient saw Dr. Lillia Carmel yesterday and has a follow-up with her in one month. He is still using topical antibiotics to both wounds although he can't recall exactly what type. He is still on prednisone 60 mg. Dr. Lillia Carmel stated that the doxycycline could stop if we were in agreement. 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 The right medial leg was induced by biopsy/pathergy. The area on the right is essentially healed. Still on high-dose prednisone using topical antibiotics to the wound 07/09/17; pyoderma gangrenosum with wounds on the left lateral leg. The right medial leg has closed and remains closed. He  is still on prednisone 60. oHe tells me he missed his last dermatology appointment with Dr. Lillia Carmel but will make another appointment. He reports that her blood sugar at a recent screen in [place] was high 200's. He was 180 today. 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 I think he is going to require still much longer prednisone perhaps another 3 months before attempting to taper. In the meantime his wound is a lot better. Smaller. He is cleaning this off daily and applying topical antibiotics. When he was last in the clinic I thought about changing to [facility] and actually put in a couple of calls to dermatology although probably not during their business hours. 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 He continues on oral prednisone. He has been using triple antibiotic ointment. There is surface debris and we will transition to [facility] and have him return in 2 weeks. He has lost 30 pounds since his last appointment with lifestyle modification. He may benefit from topical steroid cream for treatment this can be considered at a later date. 08/22/17 on evaluation today patient appears to actually be doing rather well in regard to his left lateral lower extremity ulcer. He has actually been managed by Dr. Dellia Nims most recently. Patient is currently on oral steroids at this time. This seems to have been of benefit for him. Nonetheless his last visit was actually with Leah on 08/06/17. Currently he is not utilizing any topical steroid creams although this could be of benefit as well. No fevers, chills, nausea, or vomiting noted at this time. 09/05/17 on evaluation today patient appears to be doing better in regard to his left lateral lower extremity ulcer. He has been tolerating the dressing changes without complication. He is using Santyl with good effect. Overall I'm  very pleased with how things are standing at this point. Patient likewise is happy that this is doing better. 09/19/17 on evaluation today patient actually appears to be doing rather well in regard to his left lateral lower extremity ulcer. 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 He's not having any significant pain. 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 He has been tolerating the Santyl without complication and in general I feel like he's making good progress. 10/17/17 on evaluation today patient  appears to be doing very well in regard to his left lateral lower surety ulcer. He has been tolerating the dressing changes without complication. There does not appear to be any evidence of infection he's alternating the Santyl and the triple antibiotic ointment every other day this seems to be doing well for him. 11/03/17 on evaluation today patient appears to be doing very well in regard to his left lateral lower extremity ulcer. He is been tolerating the dressing changes without complication which is good news. Fortunately there does not appear to be any evidence of infection which is also great news. Overall is doing excellent they are starting to taper down on the prednisone is down to 40 mg at this point it also started topical clobetasol for him. 11/17/17 on evaluation today patient appears to be doing well in regard to his left lateral lower surety ulcer. He's been tolerating the dressing changes without complication. He does note that he is having no pain, no excessive drainage or discharge, and overall he feels like things are going about how he would expect and hope they would. Overall he seems to have no evidence of infection at this time in my opinion which is good news. 12/04/17-He is seen in follow-up  evaluation for right lateral lower extremity ulcer. He has been applying topical steroid cream. Today's measurement show slight increase in size. Over the next 2 weeks we will transition to every other day Santyl and steroid cream. He has been encouraged to monitor for changes and notify clinic with any concerns 12/15/17 on evaluation today patient's left lateral motion the ulcer and fortunately is doing worse again at this point. This just since last week to this week has close to doubled in size according to the patient. I did not seeing last week's I do not have a visual to compare this to in our system was also down so we do not have all the charts and at this point. Nonetheless it does have me somewhat concerned in regard to the fact that again he was worried enough about it he has contact the dermatology that placed them back on the full strength, 50 mg a day of the prednisone that he was taken previous. He continues to alternate using clobetasol along with Santyl at this point. He is obviously somewhat frustrated. 12/22/17 on evaluation today patient appears to be doing a little worse compared to last evaluation. Unfortunately the wound is a little deeper and slightly larger than the last week's evaluation. With that being said he has made some progress in regard to the irritation surrounding at this time unfortunately despite that progress that's been made he still has a significant issue going on here. 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 TRON, FLYTHE ([id number]) For that reason I discussed with him today the possibility of perform a wound culture to ensure there's nothing overtly infected. 01/06/18 on evaluation today patient's wound is larger and deeper than previously evaluated. With that being said it did appear that his wound was  infected after my last evaluation with him. Subsequently I did end up prescribing a prescription for Bactrim DS which she has been taking and having no complication with. Fortunately there does not appear to be any evidence of infection at this point in time as far as anything spreading, no want to touch, and overall I feel like  things are showing signs of improvement. 01/13/18 on evaluation today patient appears to be even a little larger and deeper than last time. There still muscle exposed in the base of the wound. Nonetheless he does appear to be less erythematous I do believe inflammation is calming down also believe the infection looks like it's probably resolved at this time based on what I'm seeing. No fevers, chills, nausea, or vomiting noted at this time. 01/30/18 on evaluation today patient actually appears to visually look better for the most part. Unfortunately those visually this looks better he does seem to potentially have what may be an abscess in the muscle that has been noted in the central portion of the wound. This is the first time that I have noted what appears to be fluctuance in the central portion of the muscle. With that being said I'm somewhat more concerned about the fact that this might indicate an abscess formation at this location. I do believe that an ultrasound would be appropriate. This is likely something we need to try to do as soon as possible. He has been switch to mupirocin ointment and he is no longer using the steroid ointment as prescribed by dermatology he sees them again next week he's been decreased from 60 to 40 mg of prednisone. 03/09/18 on evaluation today patient actually appears to be doing a little better compared to last time I saw him. There's not as much erythema surrounding the wound itself. He I did review his most recent infectious disease note which was dated 02/24/18. He saw Dr. Michel Bickers in [place]. 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 Patient is now off of antibiotics and they are continually observing this. There seems to be no change in the past two weeks in my pinion based on what the patient says and what I see today compared to what Dr. Megan Salon likely saw two weeks ago. No fevers, chills, nausea, or vomiting noted at this time. 03/23/18 on evaluation today patient's wound actually appears to be showing signs of improvement which is good news. He is currently still on the Dapsone. He is also working on tapering the prednisone to get off of this and Dr. Lottie Rater is working with him in this regard. Nonetheless overall I feel like the wound is doing well it does appear based on the infectious disease note that I reviewed from Dr. 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 I did also review the results of his ultrasound of left lower extremity which revealed there was a dentist tissue in the base of the wound without an abscess noted. 04/06/18 on evaluation today the patient's left lateral lower extremity ulcer actually appears to be doing fairly well which is excellent news. There does not appear to be any evidence of infection at this time which is also great news. Overall he still does have a significantly large ulceration although little by little he seems to be making progress. He is down to 10 mg a day of the prednisone. 04/20/18 on evaluation today patient actually appears to be doing excellent at this time in regard to his left lower extremity ulcer. He's making signs of good progress unfortunately this is taking much longer than we would really like to see but nonetheless he is  making progress. Fortunately there does not appear to be any evidence of infection at this time. No fevers, chills, nausea, or vomiting noted at  this time. The patient has not been using the Santyl due to the cost he hadn't got in this field yet. He's mainly been using the antibiotic ointment topically. Subsequently he also tells me that he really has not been scrubbing in the shower I think this would be helpful again as I told him it doesn't have to be anything too aggressive to even make it believe just enough to keep it free of some of the loose slough and biofilm on the wound surface. 05/11/18 on evaluation today patient's wound appears to be making slow but sure progress in regard to the left lateral lower extremity ulcer. He is been tolerating the dressing changes without complication. Fortunately there does not appear to be any evidence of infection at this time. He is still just using triple antibiotic ointment along with clobetasol occasionally over the area. He never got the Santyl and really does not seem to intend to in my pinion. 06/01/18 on evaluation today patient appears to be doing a little better in regard to his left lateral lower extremity ulcer. He states that overall he does not feel like he is doing as well with the Dapsone as he did with the prednisone. Nonetheless he sees his dermatologist later today and is gonna talk to them about the possibility of going back on the prednisone. Overall again I believe that the wound would be better if you would utilize Santyl but he really does not seem to be interested in going back to the Jaconita at this point. He has been using triple antibiotic ointment. 06/15/18 on evaluation today patient's wound actually appears to be doing about the same at this point. Fortunately there is no signs of infection at this time. He has made slight improvements although he continues to not really want to clean the wound bed at this point. He states that he just doesn't mess with it he doesn't want to cause any problems with everything else he has going on. He has been on medication, antibiotics  as prescribed by his dermatologist, for a staff infection of his lower extremities which is really drying out now and looking much better he tells me. Fortunately there is no sign of overall infection. 06/29/18 on evaluation today patient appears to be doing well in regard to his left lateral lower surety ulcer all things considering. Fortunately his staff infection seems to be greatly improved compared to previous. He has no signs of infection and this is drying up quite nicely. He is still the doxycycline for this is no longer on cental, Dapsone, or any of the other medications. His dermatologist has recommended possibility of an infusion but right now he does not want to proceed with that. 07/13/18 on evaluation today patient appears to be doing about the same in regard to his left lateral lower surety ulcer. Fortunately there's no signs of infection at this time which is great news. Unfortunately he still builds up a significant amount of Slough/biofilm of the surface of the wound he still is not really cleaning this as he should be appropriately. Again I'm able to easily with saline and gauze remove the majority of this on the surface which if you would do this at home would likely be a dramatic improvement for him as far as getting the area to improve. Nonetheless overall I still feel like he  is making progress is just very slow. I think Santyl will be of benefit for him as well. Still he has not gotten this as of this point. 07/27/18 on evaluation today patient actually appears to be doing little worse in regards of the erythema around the periwound region of the wound he also tells me that he's been having more drainage currently compared to what he was experiencing last time I saw him. He states not quite as bad as what he had because this was infected previously but nonetheless is still appears to be doing poorly. Fortunately there is no evidence of systemic infection at this point. The patient  tells me that he is not going to be able to afford the Santyl. He is still waiting to hear about the infusion therapy with his dermatologist. Apparently she wants an updated colonoscopy first. 08/10/18 on evaluation today patient appears to be doing better in regard to his left lateral lower extremity ulcer. Fortunately he is showing signs of improvement in this regard he's actually been approved for Remicade infusion's as well although this has not been scheduled as of yet. Fortunately there's no signs of active infection at this time in regard to the wound although he is having some issues with infection of the right lower extremity is been seen as dermatologist for this. Fortunately they are definitely still working with him trying to keep things under control. AARIZ, MAISH ([id number]) 09/07/18 on evaluation today patient is actually doing rather well in regard to his left lateral lower extremity ulcer. He notes these actually having some hair grow back on his extremity which is something he has not seen in years. He also tells me that the pain is really not giving them any trouble at this time which is also good news overall she is very pleased with the progress he's using a combination of the mupirocin along with the probate is all mixed. 09/21/18 on evaluation today patient actually appears to be doing fairly well all things considered in regard to his looks from the ulcer. He's been tolerating the dressing changes without complication. Fortunately there's no signs of active infection at this time which is good news he is still on all antibiotics or prevention of the staff infection. He has been on prednisone for time although he states it is gonna contact his dermatologist and see if she put them on a short course due to some irritation that he has going on currently. Fortunately there's no evidence of any overall worsening this is going very slow I think cental would be something that would be  helpful for him although he states that $50 for tube is quite expensive. He therefore is not willing to get that at this point. 10/06/18 on evaluation today patient actually appears to be doing decently well in regard to his left lateral leg ulcer. He's been tolerating the dressing changes without complication. Fortunately there's no signs of active infection at this time. Overall I'm actually rather pleased with the progress he's making although it's slow he doesn't show any signs of infection and he does seem to be making some improvement. I do believe that he may need a switch up and dressings to try to help this to heal more appropriately and quickly. 10/19/18 on evaluation today patient actually appears to be doing better in regard to his left lateral lower extremity ulcer. This is shown signs of having much less Slough buildup at this point due to the fact he has been using  the Santyl. Obviously this is very good news. The overall size of the wound is not dramatically smaller but again the appearance is. 11/02/18 on evaluation today patient actually appears to be doing quite well in regard to his lower [place] ulcer. A lot of the skin around the ulcer is actually somewhat irritating at this point this seems to be more due to the dressing causing irritation from the adhesive that anything else. Fortunately there is no signs of active infection at this time. 11/24/18 on evaluation today patient appears to be doing a little worse in regard to his overall appearance of his lower extremity ulcer. There's more erythema and warmth around the wound unfortunately. He is currently on doxycycline which he has been on for some time. With that being said I'm not sure that seems to be helping with what appears to possibly be an acute cellulitis with regard to his left lower extremity ulcer. No fevers, chills, nausea, or vomiting noted at this time. 12/08/18 on evaluation today patient's wounds actually appears to  be doing significantly better compared to his last evaluation. He has been using Santyl along with alternating tripling about appointment as well as the steroid cream seems to be doing quite well and the wound is showing signs of improvement which is excellent news. Fortunately there's no evidence of infection and in fact his culture came back negative with only normal skin flora noted. 12/21/2018 upon evaluation today patient actually appears to be doing excellent with regard to his ulcer. This is actually the best that I have seen it since have been helping to take care of him. It is both smaller as well as less slough noted on the surface of the wound and seems to be showing signs of good improvement with new skin growing from the edges. He has been using just the triamcinolone he does wonder if he can get a refill of that ointment today. 01/04/2019 upon evaluation today patient actually appears to be doing well with regard to his left lateral lower extremity ulcer. With that being said it does not appear to be that he is doing quite as well as last time as far as progression is concerned. There does not appear to be any signs of infection or significant irritation which is good news. With that being said I do believe that he may benefit from switching to a collagen based dressing based on how clean The wound appears. 01/18/2019 on evaluation today patient actually appears to be doing well with regard to his wound on the left lower extremity. He is not made a lot of progress compared to where we were previous but nonetheless does seem to be doing okay at this time which is good news. There is no signs of active infection which is also good news. My only concern currently is I do wish we can get him into utilizing the collagen dressing his insurance would not pay for the supplies that we ordered although it appears that he may be able to order this through his supply company that he typically utilizes.  This is [employer]. Nonetheless he did try to order it during the office visit today and it appears this did go through. We will see if he can get that it is a different brand but nonetheless he has collagen and I do think will be beneficial. 02/01/2019 on evaluation today patient actually appears to be doing a little worse today in regard to the overall size of his wounds. Fortunately there  is no signs of active infection at this time. That is visually. Nonetheless when this is happened before it was due to infection. For that reason were somewhat concerned about that this time as well. 02/08/2019 on evaluation today patient unfortunately appears to be doing slightly worse with regard to his wound upon evaluation today. Is measuring a little deeper and a little larger unfortunately. I am not really sure exactly what is causing this to enlarge he actually did see his dermatologist she is going to see about initiating Humira for him. Subsequently she also did do steroid injections into the wound itself in the periphery. Nonetheless still nonetheless he seems to be getting a little bit larger he is gone back to just using the steroid cream topically which I think is appropriate. I would say hold off on the collagen for the time being is definitely a good thing to do. Based on the culture results which we finally did get the final result back regarding it shows staph as the bacteria noted again that can be a normal skin bacteria based on the fact however he is having increased drainage and worsening of the wound measurement wise I would go ahead and place him on an antibiotic today I do believe for this. 02/15/2019 on evaluation today patient actually appears to be doing somewhat better in regard to his ulcer. There is no signs of worsening at this time I did review his culture results which showed evidence of Staphylococcus aureus but not MRSA. Again this could just be more related to the normal skin  bacteria although he states the drainage has slowed down quite a bit he may have had a mild infection not just colonization. And was much smaller and then since around10/04/2019 on evaluation today patient appears to be doing unfortunately worse as far as the size of the wound. I really feel like that this is steadily getting larger again it had been doing excellent right at the beginning of September we have seen a steady increase in the area of the wound it is almost 2-1/2 times the size it was on September 1. Obviously this is a bad trend this is not wanting to see. For that reason we went back to using just the topical triamcinolone cream which does seem to help with inflammation. I checked him for bacteria by way of culture and nothing showed positive there. I am considering giving him a short course of a tapering steroid Dosepak today to see if that is can be beneficial for him. The patient is in agreement with giving that a try. 03/08/2019 on evaluation today patient appears to be doing very well in comparison to last evaluation with regard to his lower extremity ulcer. This is showing signs of less inflammation and actually measuring slightly smaller compared to last time every other week over the past month and a half he has been measuring larger larger larger. Nonetheless I do believe that the issue has been inflammation the prednisone does seem to [facility] [place] ([id number]) have been beneficial for him which is good news. No fevers, chills, nausea, vomiting, or diarrhea. 03/22/2019 on evaluation today patient appears to be doing about the same with regard to his leg ulcer. He has been tolerating the dressing changes without complication. With that being said the wound seems to be mostly arrested at its current size but really is not making any progress except for when we prescribed the prednisone. He did show some signs of dropping as far as  the overall size of the wound during that interval  week. Nonetheless this is something he is not on long-term at this point and unfortunately I think he is getting need either this or else the Humira which his dermatologist has discussed try to get approval for. With that being said he will be seeing his dermatologist on the 11th of this month that is November. 04/19/2019 on evaluation today patient appears to be doing really about the same the wound is measuring slightly larger compared to last time I saw him. He has not been into the office since November 2 due to the fact that he unfortunately had Covid as that his entire family. He tells me that it was rough but they did pull-through and he seems to be doing much better. Fortunately there is no signs of active infection at this time. No fevers, chills, nausea, vomiting, or diarrhea. 05/10/2019 on evaluation today patient unfortunately appears to be doing significantly worse as compared to last time I saw him. He does tell me that he has had his first dose of Humira and actually is scheduled to get the next one in the upcoming week. With that being said he tells me also that in the past several days he has been having a lot of issues with green drainage she showed me a picture this is more blue-green in color. He is also been having issues with increased sloughy buildup and the wound does appear to be larger today. Obviously this is not the direction that we want everything to take based on the starting of his Humira. Nonetheless I think this is definitely a result of likely infection and to be honest I think this is probably Pseudomonas causing the infection based on what I am seeing. 05/24/2019 on evaluation today patient unfortunately appears to be doing significantly worse compared to his prior evaluation with me 2 weeks ago. I did review his culture results which showed that he does have Staph aureus as well as Pseudomonas noted on the culture. Nonetheless the Levaquin that I prescribed for him  does not appear to have been appropriate and in fact he tells me he is no longer experiencing the green drainage and discharge that he had at the last visit. Fortunately there is no signs of active infection at this time which is good news although the wound has significantly worsened it in fact is much deeper than it was previous. We have been utilizing up to this point triamcinolone ointment as the prescription topical of choice but at this time I really feel like that the wound is getting need to be packed in order to appropriately manage this due to the deeper nature of the wound. Therefore something along the lines of an alginate dressing may be more appropriate. 05/31/2019 upon inspection today patient's wound actually showed signs of doing poorly at this point. Unfortunately he just does not seem to be making any good progress despite what we have tried. He actually did go ahead and pick up the Cipro and start taking that as he was noticing more green drainage he had previously completed the Levaquin that I prescribed for him as well. Nonetheless he missed his appointment for the seventh last week on Wednesday with the wound care center and [facility] where his dermatologist referred him. Obviously I do think a second opinion would be helpful at this point especially in light of the fact that the patient seems to be doing so poorly despite the fact  that we have tried everything that I really know how at this point. The only thing that ever seems to have helped him in the past is when he was on high doses of continual steroids that did seem to make a difference for him. 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 06/07/2019 upon evaluation today patient unfortunately appears to be doing worse yet again with regard to his wound. In fact I  am starting to question whether or not he may have a fluid pocket in the muscle at this point based on the bulging and the soft appearance to the central portion of the muscle area. There is not anything draining from the muscle itself at this time which is good news but nonetheless the wound is expanding. I am not really seeing any results of the Humira as far as overall wound progression based on what I am seeing at this point. The patient has been referred for second opinion with regard to his wound to the [facility] wound care center by his dermatologist which I definitely am not in opposition to. Unfortunately we tried multiple dressings in the past including collagen, alginate, and at one point even Hydrofera Blue. With that being said he is never really used it for any significant amount of time due to the fact that he often complains of pain associated with these dressings and then will go back to either using the Santyl which she has done intermittently or more frequently the triamcinolone. He is also using his own compression stockings. We have wrapped him in the past but again that was something else that he really was not a big fan of. 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 He seemed to do much better when he was on the steroids although honestly I understand that the steroids are not necessarily the best medication to be on long-term obviously 06/14/2019 on evaluation today patient appears to be doing actually a little bit better with regard to the overall appearance with his leg. Unfortunately he does continue to have issues with what appears to be some fluid underneath  the muscle although he did see the wound specialty center at [facility] last week their main goals were to see about infusion therapy in place of the Humira as they feel like that is not quite strong enough. They also recommended that we continue with the treatment otherwise as we are they felt like that was appropriate and they are okay with him continuing to follow-up here with [place] in that regard. With that being said they are also sending him to the vein specialist there to see about vein stripping and if that would be of benefit for him. Subsequently they also did not really address whether or not an ultrasound of the muscle area to see if there is anything that needs to be addressed here would be appropriate or not. For that reason I discussed this with him last week I think we  may proceed down that road at this point. 06/21/2019 upon evaluation today patient's wound actually appears to be doing slightly better compared to previous evaluations. I do believe that he has made a difference with regard to the progression here with the use of oral steroids. Again in the past has been the only thing that is really calm things down. He does tell me that from [facility] is gotten a good news from there that there are no further vein stripping that is necessary at this point. I do not have that available for review today although the patient did relay this to me. He also did obtain and have the ultrasound of the wound completed which I did sign off on today. It does appear that there is no fluid collection under the muscle this is likely then just edematous tissue in general. That is also good news. Overall I still believe the inflammation is the main issue here. He did inquire about the possibility of a wound VAC again with the muscle protruding like it is I am not really sure whether the wound VAC is necessarily ideal or not. That is something we will have to consider although I do believe he may need compression  wrapping to try to help with edema control which could potentially be of benefit. 06/28/2019 on evaluation today patient appears to be doing slightly better measurement wise although this is not terribly smaller he least seems to be trending towards that direction. With that being said he still seems to have purulent drainage noted in the wound bed at this time. He has been on Levaquin followed by Cipro over the past month. Unfortunately he still seems to have some issues with active infection at this time. I did perform a culture last week in order to evaluate and see if indeed there was still anything going on. Subsequently the culture did come back showing Pseudomonas which is consistent with the drainage has been having which is blue-green in color. He also has had an odor that again was somewhat consistent with Pseudomonas as well. Long story short it appears that the culture showed an intermediate finding with regard to how well the Cipro will work for the Pseudomonas infection. Subsequently being that he does not seem to be clearing up and at best what we are doing is just keeping this at [place] I think he may need to see infectious disease to discuss IV antibiotic options. LIRON, EISSLER ([id number]) 07/05/2019 upon evaluation today patient appears to be doing okay in regard to his leg ulcer. He has been tolerating the dressing changes at this point without complication. Fortunately there is no signs of active infection at this time which is good news. No fevers, chills, nausea, vomiting, or diarrhea. With that being said he does have an appointment with infectious disease tomorrow and his primary care on Wednesday. Again the reason for the infectious disease referral was due to the fact that he did not seem to be fully resolving with the use of oral antibiotics and therefore we were thinking that IV antibiotic therapy may be necessary secondary to the fact that there was an intermediate finding for  how effective the Cipro may be. Nonetheless again he has been having a lot of purulent and even green drainage. Fortunately right now that seems to have calmed down over the past week with the reinitiation of the oral antibiotic. Nonetheless we will see what Dr. Megan Salon has to say. 07/12/2019 upon evaluation today patient appears to be  doing about the same at this point in regard to his left lower extremity ulcer. Fortunately there is no signs of active infection at this time which is good news I do believe the Levaquin has been beneficial I did review Dr. Hale Bogus note and to be honest I agree that the patient's leg does appear to be doing better currently. What we found in the past as he does not seem to really completely resolve he will stop the antibiotic and then subsequently things will revert back to having issues with blue-green drainage, increased pain, and overall worsening in general. Obviously that is the reason I sent him back to infectious disease. 07/19/2019 upon evaluation today patient appears to be doing roughly the same in size there is really no dramatic improvement. He has started back on the Levaquin at this point and though he seems to be doing okay he did still have a lot of blue/green drainage noted on evaluation today unfortunately. I think that this is still indicative more likely of a Pseudomonas infection as previously noted and again he does see Dr. Megan Salon in just a couple of days. I do not know that were really able to effectively clear this with just oral antibiotics alone based on what I am seeing currently. Nonetheless we are still continue to try to manage as best we can with regard to the patient and his wound. I do think the wrap was helpful in decreasing the edema which is excellent news. No fevers, chills, nausea, vomiting, or diarrhea. 07/26/2019 upon evaluation today patient appears to be doing slightly better with regard to the overall appearance of the muscle  there is no dark discoloration centrally. Fortunately there is no signs of active infection at this time. No fevers, chills, nausea, vomiting, or diarrhea. Patient's wound bed currently the patient did have an appointment with Dr. Megan Salon at infectious disease last week. With that being said Dr. Megan Salon the patient states was still somewhat hesitant about put him on any IV antibiotics he wanted [place] to repeat cultures today and then see where things go going forward. He does look like Dr. Megan Salon because of some improvement the patient did have with the Levaquin wanted [place] to see about repeating cultures. If it indeed grows the Pseudomonas again then he recommended a possibility of considering a PICC line placement and IV antibiotic therapy. He plans to see the patient back in 1 to 2 weeks. 08/02/2019 upon evaluation today patient appears to be doing poorly with regard to his left lower extremity. We did get the results of his culture back it shows that he is still showing evidence of Pseudomonas which is consistent with the purulent/blue-green drainage that he has currently. Subsequently the culture also shows that he now is showing resistance to the oral fluoroquinolones which is unfortunate as that was really the only thing to treat the infection prior. I do believe that he is looking like this is going require IV antibiotic therapy to get this under control. Fortunately there is no signs of systemic infection at this time which is good news. The patient does see Dr. Megan Salon tomorrow. 08/09/2019 upon evaluation today patient appears to be doing better with regard to his left lower extremity ulcer in regard to the overall appearance. He is currently on IV antibiotic therapy. As ordered by Dr. Megan Salon. Currently the patient is on ceftazidime which she is going to take for the next 2 weeks and then follow-up for 4 to 5-week appointment with Dr. Megan Salon.  The patient started this this past Friday  symptoms have not for a total of 3 days currently in full. 08/16/2019 upon evaluation today patient's wound actually does show muscle in the base of the wound but in general does appear to be much better as far as the overall evidence of infection is concerned. In fact I feel like this is for the most part cleared up he still on the IV antibiotics he has not completed the full course yet but I think he is doing much better which is excellent news. 08/23/2019 upon evaluation today patient appears to be doing about the same with regard to his wound at this point. He tells me that he still has pain unfortunately. Fortunately there is no evidence of systemic infection at this time which is great news. There is significant muscle protrusion. 09/13/19 upon evaluation today patient appears to be doing about the same in regard to his leg unfortunately. He still has a lot of drainage coming from the ulceration there is still muscle exposed. With that being said the patient's last wound culture still showed an intermediate finding with regard to the Pseudomonas he still having the bluish/green drainage as well. Overall I do not know that the wound has completely cleared of infection at this point. Fortunately there is no signs of active infection systemically at this point which is good news. 09/20/2019 upon evaluation today patient's wound actually appears to be doing about the same based on what I am seeing currently. I do not see any signs of systemic infection he still does have evidence of some local infection and drainage. He did see Dr. Megan Salon last week and Dr. Megan Salon states that he probably does need a different IV antibiotic although he does not want to put him on this until the patient begins the Remicade infusion which is actually scheduled for about 10 days out from today on 13 May. Following that time Dr. Megan Salon is good to see him back and then will evaluate the feasibility of starting him on the  IV antibiotic therapy once again at that point. I do not disagree with this plan I do believe as Dr. Megan Salon stated in his note that I reviewed today that the patient's issue is multifactorial with the pyoderma being 1 aspect of this that were hoping the Remicade will be helpful for her. In the meantime I think the gentamicin is, helping to keep things under decent okay control in regard to the ulcer. 09/27/2019 upon evaluation today patient appears to be doing about the same with regard to his wound still there is a lot of muscle exposure though he does have some hyper granulation tissue noted around the edge and actually some granulation tissue starting to form over the muscle which is actually good news. Fortunately there is no evidence of active infection which is also good news. His pain is less at this point. 5/21; this is a patient I have not seen in a long time. 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 He has a large wound on the left lateral leg with protruding muscle. He comes in the clinic today showing the same area on his left medial ankle. He says there is been a spot there for some time although we have not previously defined this. Today he has a clearly defined area with slight amount of skin breakdown surrounded by raised areas with a purplish hue in color. This is not painful he says it is irritated.  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 He still has muscle protruding from the lateral portion of his left leg but fortunately the new area that they were concerned about at his last visit does not appear to have opened at this point. He is currently on Remicade infusions and seems to be doing better in my opinion in fact the wound itself seems to be overall much better. The purplish discoloration that he did have seems to have resolved  and I think that is a good sign that hopefully the Remicade is doing its job. He does have some biofilm noted over the surface of the wound. 11/01/2019 on evaluation today patient's wound actually appears to be doing excellent at this time. Fortunately there is no evidence of active infection and overall I feel like he is making great progress. The Remicade seems to be due excellent job in my opinion. PARIS, CHIRIBOGA ([id number]) 11/08/19 evaluation today vision actually appears to be doing quite well with regard to his weight ulcer. He's been tolerating dressing changes without complication. Fortunately there is no evidence of infection. No fevers, chills, nausea, or vomiting noted at this time. Overall states that is having more itching than pain which is actually a good sign in my opinion. 12/13/2019 upon evaluation today patient appears to be doing well today with regard to his wound. He has been tolerating the dressing changes without complication. Fortunately there is no sign of active infection at this time. No fevers, chills, nausea, vomiting, or diarrhea. Overall I feel like the infusion therapy has been very beneficial for him. 01/06/2020 on evaluation today patient appears to be doing well with regard to his wound. This is measuring smaller and actually looks to be doing better. Fortunately there is no signs of active infection at this point. No fevers, chills, nausea, vomiting, or diarrhea. With that being said he does still have the blue-green drainage but this does not seem to be causing any significant issues currently. He has been using the gentamicin that does seem to be keeping things under decent control at this point. He goes later this morning for his next infusion therapy for the pyoderma which seems to also be very beneficial. 02/07/2020 on evaluation today patient appears to be doing about the same in regard to his wounds currently. Fortunately there is no signs of active infection  systemically he does still have evidence of local infection still using gentamicin. He also is showing some signs of improvement albeit slowly I do feel like we are making some progress here. 02/21/2020 upon evaluation today patient appears to be making some signs of improvement the wound is measuring a little bit smaller which is great news and overall I am very pleased with where he stands currently. He is going to be having infusion therapy treatment on the 15th of this month. Fortunately there is no signs of active infection at this time. 03/13/2020 I do believe patient's wound is actually showing some signs of improvement here which is great news. He has continue with the infusion therapy through rheumatology/dermatology at [facility]. That does seem to be beneficial. I still think he gets as much benefit from this as he did from the prednisone initially but nonetheless obviously this is less harsh on his body that the prednisone as far as they are concerned. 03/31/2020 on evaluation today patient's wound actually showing signs of some pretty good improvement in regard  to the overall appearance of the wound bed. There is still muscle exposed though he does have some epithelial growth around the edges of the wound. Fortunately there is no signs of active infection at this time. No fevers, chills, nausea, vomiting, or diarrhea. 04/24/2020 upon evaluation today patient appears to be doing about the same in regard to his leg ulcer. He has been tolerating the dressing changes without complication. Fortunately there is no signs of active infection at this time. No fevers, chills, nausea, vomiting, or diarrhea. With that being said he still has a lot of irritation from the bandaging around the edges of the wound. We did discuss today the possibility of a referral to plastic surgery. 05/22/2020 on evaluation today patient appears to be doing well with regard to his wounds all things considered. He has not been able  to get the Chantix apparently there is a recall nurse that I was unaware of put out by [employer] involuntarily. Nonetheless for now I am and I have to do some research into what may be the best option for him to help with quitting in regard to smoking and we discussed that today. 06/26/2020 upon evaluation today patient appears to be doing well with regard to his wound from the standpoint of infection I do not see any signs of infection at this point. With that being said unfortunately he is still continuing to have issues with muscle exposure and again he is not having a whole lot of new skin growth unfortunately. There does not appear to be any signs of active infection at this time. No fevers, chills, nausea, vomiting, or diarrhea. 07/10/2020 upon evaluation today patient appears to be doing a little bit more poorly currently compared to where he was previous. I am concerned currently about an active infection that may be getting worse especially in light of the increased size and tenderness of the wound bed. No fevers, chills, nausea, vomiting, or diarrhea. 07/24/2020 upon evaluation today patient appears to be doing poorly in regard to his leg ulcer. He has been tolerating the dressing changes without complication but unfortunately is having a lot of discomfort. 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 Subsequently I think he is going require possibly IV antibiotics to get this under control. I am very concerned about the severity of his infection and the amount of discomfort he is having. 07/31/2020 upon evaluation today patient appears to be doing about the same in regard to his leg wound. He did see Dr. Megan Salon and Dr. Megan Salon is actually going to start him on IV antibiotics. He goes for the PICC line tomorrow. With that being said there do not have that run for 2 weeks and then see how things are doing and depending on how he  is progressing they may extend that a little longer. Nonetheless I am glad this is getting ready to be in place and definitely feel it may help the patient. In the meantime is been using mainly triamcinolone to the wound bed has an anti-inflammatory. 08/07/2020 on evaluation today patient appears to be doing well with regard to his wound compared even last week. In the interim he has gotten the PICC line placed and overall this seems to be doing excellent. There does not appear to be any evidence of infection which is great news systemically although locally of course has had the infection this appears to be improving with the use of the antibiotics. 08/14/2020 upon  evaluation today patient's wound actually showing signs of excellent improvement. Overall the irritation has significantly improved the drainage is back down to more of a normal level and his pain is really pretty much nonexistent compared to what it was. Obviously I think that this is significantly improved secondary to the IV antibiotic therapy which has made all the difference in the world. Again he had a resistant form of Pseudomonas for which oral antibiotics just was not cutting it. Nonetheless I do think that still we need to consider the possibility of a surgical closure for this wound is been open so long and to be honest with muscle exposed I think this can be very hard to get this to close outside of this although definitely were still working to try to do what we can in that regard. 08/21/2020 upon evaluation today patient appears to be doing very well with regard to his wounds on the left lateral lower extremity/calf area. Fortunately there does not appear to be signs of active infection which is great news and overall very pleased with where things stand today. He is actually wrapping up his treatment with IV antibiotics tomorrow. After that we will see where things go from there. 08/28/2020 upon evaluation today patient appears  to be doing decently well with regard to his leg ulcer. There does not appear to be any signs of active infection which is great news and overall very pleased with where things stand today. No fevers, chills, nausea, vomiting, or diarrhea. 09/18/2020 upon evaluation today patient appears to be doing well with regard to his infection which I feel like is better. Unfortunately he is not doing as well with regard to the overall size of the wound which is not nearly as good at this point. I feel like that he may be having an issue here with the pyoderma being somewhat out of control. I think that he may benefit from potentially going back and talking to the dermatologist about ELIGHA, KMETZ ([id number]) what to do from the pyoderma standpoint. I am not certain if the infusions are helping nearly as much is what the prednisone did in the past. 10/02/2020 upon evaluation today patient appears to be doing well with regard to his leg ulcer. He did go to the [facility]iatric nurse. Unfortunately they feel like there is a 10% chance that most that he would be able to heal and that the skin graft would take. Obviously this has led him to not be able to go down that path as far as treatment is concerned. Nonetheless he does seem to be doing a little bit better with the prednisone that I gave him last time. I think that he may need to discuss with dermatology the possibility of long-term prednisone as that seems to be what is most helpful for him to be perfectly honest. I am not sure the Remicade is really doing the job. 10/17/2020 upon evaluation today patient appears to be doing a little better in regard to his wound. In fact the case has been since we did the prednisone on May 2 for him that we have noticed a little bit of improvement each time we have seen a size wise as well as appearance wise as well as pain wise. I think the prednisone has had a greater effect then the infusion therapy has to be perfectly honest. With  that being said the patient also feels significantly better compared to what he was previous. All of this is good news but  nonetheless I am still concerned about the fact that again we are really not set up to long-term manage him as far as prednisone is concerned. Obviously there are things that you need to be watched I completely understand the risk of prednisone usage as well. 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 With all that being said I do believe that we can give him another round of the prednisone which she is requesting today because of the improvement that he seen since we did that first round. 10/30/2020 upon evaluation today patient's wound actually is showing signs of doing quite well. There does not appear to be any evidence of infection which is great news and overall very pleased with where things stand today. No fevers, chills, nausea, vomiting, or diarrhea. He tells me that the prednisone still has seem to have helped he wonders if we can extend that for just a little bit longer. He did not have the appointment with a dermatologist although he did have an infusion appointment last Friday. That was at [facility]. With that being said he tells me he could not do both that as well as the appointment with the physician on the same day therefore that is can have to be rescheduled. I really want to see if there is anything they feel like that could be done differently to try to help this out as I am not really certain that the infusions are helping significantly here. 11/13/2020 upon evaluation today patient unfortunately appears to be doing somewhat poorly in regard to his wound I feel like this is actually worsening from the standpoint of the pyoderma spreading. I still feel like that he may need something different as far as trying to manage this going forward. Again we did the prednisone unfortunately his blood sugars are not doing so well  because of this. Nonetheless I believe that the patient likely needs to try topical steroid. We have done triamcinolone for a while I think going with something stronger such as clobetasol could be beneficial again this is not something I do lightly I discussed this with the patient that again this does not normally put underneath an occlusive dressing. Nonetheless I think a thin film as such could help with some of the stronger anti-inflammatory effects. We discussed this today. He would like to try to give this a trial for the next couple weeks. I definitely think that is something that we can do. Evaluate7/03/2021 and today patient's wound bed actually showed signs of doing really about the same. There was a little expansion of the size of the wound and that leading edge that we done looking out although the clobetasol does seem to have slowed this down a bit in my opinion. There is just 1 small area that still seems to be progressing based on what I see. Nonetheless I am concerned about the fact this does not seem to be improving if anything seems to be doing a little bit worse. I do not know that the infusions are really helping him much as next infusion is August 5 his appointment with dermatology is July 25. Either way I really think that we need to have a conversation potentially about this and I am actually going to see if I can talk with Dr. Lillia Carmel in order to see where things stand as well. 12/11/2020 upon evaluation today patient appears to be doing worse in regard to his leg ulcer. Unfortunately  I just do not think this is making the progress that I would like to see at this point. Honestly he does have an appointment with dermatology and this is in 2 days. I am wondering what they may have to offer to help with this. Right now what I am seeing is that he is continuing to show signs of worsening little by little. Obviously that is not great at all. Is the exact opposite of what we are  looking for. 12/18/2020 upon evaluation today patient appears to be doing a little better in regard to his wound. The dermatologist actually did do some steroid injections into the wound which does seem to have been beneficial in my opinion. That was on the 25th already this looks a little better to me than last time I saw him. With that being said we did do a culture and this did show that he has Staph aureus noted in abundance in the wound. With that being said I do think that getting him on an oral antibiotic would be appropriate as well. Also think we can compression wrap and this will make a difference as well. 12/28/2020 upon evaluation today patient's wound is actually showing signs of doing much better. I do believe the compression wrap is helping he has a lot of drainage but to be honest I think that the compression is helping to some degree in this regard as well as not draining through which is also good news. No fevers, chills, nausea, vomiting, or diarrhea. 01/04/2021 upon evaluation today patient appears to be doing well with regard to his wound. Overall things seem to be doing quite well. He did have a little bit of reaction to the CarboFlex Sorbact he will be using that any longer. With that being said he is controlled as far as the drainage is concerned overall and seems to be doing quite well. I do not see any signs of active infection at this time which is great news. No fevers, chills, nausea, vomiting, or diarrhea. 01/11/2021 upon evaluation today patient appears to be doing well with regard to his wounds. He has been tolerating the dressing changes without complication. Fortunately there does not appear to be any signs of active infection at this time which is great news. Overall I am extremely pleased with where we stand currently. No fevers, chills, nausea, vomiting, or diarrhea. Where using clobetasol in the wound bed he has a lot of new skin growth which is awesome as  well. 01/18/2021 upon evaluation today patient appears to be doing very well in regard to his leg ulcer. He has been tolerating the dressing changes without complication. Fortunately there does not appear to be any signs of active infection which is great news. In general I think that he is making excellent progress 01/25/2021 upon evaluation today patient appears to be doing well with regard to his wound on the leg. I am actually extremely pleased with where things stand today. There does not appear to be any signs of active infection which is great news and overall I think that we are definitely headed in the appropriate direction based on what I am seeing currently. There does not appear to be any signs of active infection also excellent news. 02/06/2021 upon evaluation today patient appears to be doing well with regard to his wound. Overall visually this is showing signs of significant improvement which is great news. I do not see any signs of active infection systemically which is great even locally  I do not think that we are seeing any major complications here. We did do fluorescence imaging with the [employer] today. The patient does have some odor and drainage noted and again this is something that I think would benefit him to probably come more frequently for nurse visits. 02/19/2021 upon evaluation today patient actually appears to be doing quite well in regard to his wound. He has been tolerating the dressing Manfre, [name] ([id number]) changes without complication and overall I think that this is making excellent progress. I do not see any evidence of active infection at this point which is great news as well. No fevers, chills, nausea, vomiting, or diarrhea. 10/10; wound is made nice progress healthy granulation with a nice rim of epithelialization which seems to be expanding even from last week he has a deeper area in the inferior part of the more distal part of the wound with not quite as  healthy as surface. This area will need to be followed. Using clobetasol and Hydrofera Blue 03/05/2021 upon evaluation today patient appears to be doing very well in regard to his leg ulcer. He has been tolerating dressing changes without complication. Fortunately there does not appear to be any signs of active infection which is great news and overall I am extremely pleased with where we stand currently. 03/12/2021 upon evaluation today patient appears to be doing well with regard to his wound in fact this is extremely extremely good based on what we are seeing today there does not appear to be any signs of active infection and overall I think that he is doing awesome from the standpoint of healing in general. I am extremely pleased with how things seem to be progressing with regard to this pyoderma. Clobetasol has done wonders for him. I think the compression wrapping has also been of great benefit. 03/19/2021 upon evaluation today patient appears to be doing well with regard to his wound. He is tolerating the dressing changes without complication. In fact I feel like that he is actually making excellent progress at this point based on what I am seeing. No fevers, chills, nausea, vomiting, or diarrhea. 03/26/2021 upon evaluation today patient appears to be doing well with regard to his wound. This again is measuring smaller and looking better. Again the progress is slow but nonetheless continual with what we have been seeing. I do believe that the current plan is doing awesome for him. 04/09/2021 upon evaluation today patient appears to be doing well with regard to his leg ulcer. This is showing signs of excellent improvement the muscle is completely closed over and there does not appear to be any evidence of inflammation at this point his drainage is significantly improved. Overall I think that he would be a good candidate for looking into a skin substitute at this point as well. We will get a look  into some approvals in that regard. Potentially TheraSkin as well as Apligraf could both be considered just depending on insurance coverage. 04/16/2021 upon evaluation today patient appears to be doing well at this point. He has been approved for the Apligraf which we could definitely order although I would like to try to get the TheraSkin approved if at all possible. I did fax notes into them today and I Georgina Peer try to give a call as well. Overall the wound appears to be doing decently well today. 04/20/2021 upon evaluation today patient actually appears to be doing quite well in regard to his wound with that being said we  are trying to see what we can do about speeding up the healing process. For that reason we did discuss the possibility of a skin substitute. We got the Apligraf approved. For that reason we will get a go ahead and see what we can do with the Apligraf at this point. I am still trying to get the TheraSkin approved but I have not heard anything from the insurance company yet I have called and talked to them earlier in the week and to be honest this was about half an hour that I spent on the phone they told me I would hear something within 10-15 business days. 04/27/2021 upon evaluation today patient appears to be doing excellent in regard to his wound. I do believe the Apligraf has been beneficial. With that being said he has had a little bit of increased pain has been a little bit concerned. I do want to go ahead and apply his steroid cream today the clobetasol and then put the Apligraf over I just do not think I want to risk not putting the clobetasol on based on what is been going on here currently. Patient voiced understanding. 05/04/2021 upon evaluation today patient is making excellent improvement. Overall there is definitely decrease in the size of the wound today and I am very pleased in that regard. I do not see any signs of active infection locally nor systemically at this  point. No fevers, chills, nausea, vomiting, or diarrhea. 05/11/2021 upon evaluation today patient appears to be doing well with regard to his wound. He has been tolerating Apligraf's without complication and is making excellent progress this is application #4 today. 12/30; patient here for Apligraf application. Appears to be doing well small wound there has been a lot of healing 05/28/2021 upon evaluation today patient appears to be doing well with regard to his wound. Has been tolerating the dressing changes without complication. Fortunately I do not see any signs of active infection locally nor systemically at this point which is great news. He is done with the Apligraf and the first round and overall this has filled in quite nicely I am not even certain he needs any additional at the moment. I would actually try to go back to clobetasol with [facility] which previously was doing well for him. 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 He is using clobetasol and Hydrofera Blue. Surface area of the wound is smaller and the surface looks very healthy. 06/11/2021 upon evaluation today patient actually appears to be showing signs of good improvement which is great news. Fortunately I do not see any signs of active infection at this time which is also excellent news. I do believe that the patient is doing well with the clobetasol and Hydrofera Blue. He does have some irritation and itching around the rest of the leg will use some triamcinolone over this area. 06/18/2021 upon evaluation today patient appears to be doing well with regard to his wound. He is showing signs of excellent improvement and overall I am extremely pleased with where we stand today. We are moving in the right direction. 06/25/2021 upon evaluation today patient appears to be doing well with regard to his wound. In fact this is doing also not showing signs of excellent improvement  overall very pleased with where we stand today. I do not see any evidence of active infection locally or systemically at this time which is also great news. 07/02/2021 upon evaluation patient's wound  bed actually showed signs of good granulation and epithelization at this point. And this is measuring significantly smaller and overall I am extremely pleased with where we stand today. No fevers, chills, nausea, vomiting, or diarrhea. Electronic Signature(s) Signed: 07/02/2021 8:46:27 AM By: Worthy Keeler PA-C Entered By: Worthy Keeler on 07/02/2021 08:46:27 MAKHARI, DOVIDIO ([id number]) -------------------------------------------------------------------------------- Physical Exam Details Patient Name: [name] Date of Service: 07/02/2021 8:15 AM Medical Record Number: [id number] Patient Account Number: [id number] Date of Birth/Sex: 24-Dec-1980 (40 y.o. M) Treating RN: Levora Dredge Primary Care Provider: [name] Other Clinician: Referring Provider: [name] Treating Provider/Extender: [name] Cliche in Treatment: 21 Constitutional Well-nourished and well-hydrated in no acute distress. Respiratory normal breathing without difficulty. Psychiatric this patient is able to make decisions and demonstrates good insight into disease process. Alert and Oriented x 3. pleasant and cooperative. Notes Upon inspection patient's wound bed actually is showing signs of excellent improvement. This is showing a little bit of smaller measurement overall and I am very pleased in that regard. I am hopeful this will continue to improve week by week as we go forward. Electronic Signature(s) Signed: 07/02/2021 8:46:57 AM By: Worthy Keeler PA-C Entered By: Worthy Keeler on 07/02/2021 08:46:57 MOURAD, CWIKLA ([id number]) -------------------------------------------------------------------------------- Physician Orders Details Patient Name: [name] Date of Service: 07/02/2021 8:15  AM Medical Record Number: [id number] Patient Account Number: [id number] Date of Birth/Sex: 03-May-1981 (40 y.o. M) Treating RN: Levora Dredge Primary Care Provider: [name] Other Clinician: Referring Provider: [name] Treating Provider/Extender: [name] Cliche in Treatment: 238 Verbal / Phone Orders: No Diagnosis Coding ICD-10 Coding Code Description I87.2 Venous insufficiency (chronic) (peripheral) L97.222 Non-pressure chronic ulcer of left calf with fat layer exposed E11.622 Type 2 diabetes mellitus with other skin ulcer L88 Pyoderma gangrenosum F17.208 Nicotine dependence, unspecified, with other nicotine-induced disorders Follow-up Appointments o Return Appointment in 1 week. o Nurse Visit as needed - twice a week Bathing/ Shower/ Hygiene o Clean wound with Normal Saline or wound cleanser. o May shower with wound dressing protected with water repellent cover or cast protector. Edema Control - Lymphedema / Segmental Compressive Device / Other o Optional: One layer of unna paste to top of compression wrap (to act as an anchor). o Elevate, Exercise Daily and Avoid Standing for Long Periods of Time. o Elevate legs to the level of the heart and pump ankles as often as possible o Elevate leg(s) parallel to the floor when sitting. Wound Treatment Wound #1 - Lower Leg Wound Laterality: Left, Lateral Cleanser: Wound Cleanser 3 x Per Week/30 Days Discharge Instructions: Wash your hands with soap and water. Remove old dressing, discard into plastic bag and place into trash. Cleanse the wound with Wound Cleanser prior to applying a clean dressing using gauze sponges, not tissues or cotton balls. Do not scrub or use excessive force. Pat dry using gauze sponges, not tissue or cotton balls. Peri-Wound Care: Moisturizing Lotion 3 x Per Week/30 Days Discharge Instructions: Suggestions: Theraderm, Eucerin, Cetaphil, or patient preference. Topical: Clobetasol  Propionate ointment 0.05%, 60 (g) tube 3 x Per Week/30 Days Discharge Instructions: Pt to bring to appt- Topical: Triamcinolone Acetonide Cream, 0.1%, 15 (g) tube 3 x Per Week/30 Days Discharge Instructions: Apply as directed by provider. To leg not on wound Primary Dressing: Hydrofera Blue Ready Transfer Foam, 4x5 (in/in) 3 x Per Week/30 Days Discharge Instructions: Apply Hydrofera Blue Ready to wound bed as directed Secondary Dressing: Zetuvit Plus Silicone Non-bordered 5x5 (in/in) 3 x Per Week/30 Days Compression  Wrap: Medichoice 4 layer Compression System, 35-40 mmHG (Generic) 3 x Per Week/30 Days Discharge Instructions: Apply multi-layer wrap as directed. Electronic Signature(s) Signed: 07/02/2021 9:31:21 AM By: Worthy Keeler PA-C Signed: 07/02/2021 4:20:39 PM By: Levora Dredge Entered By: Levora Dredge on 07/02/2021 08:37:11 Sikorski, BRANON ([id number]) ANIRUDH, BAIZ ([id number]) -------------------------------------------------------------------------------- Problem List Details Patient Name: [name] Date of Service: 07/02/2021 8:15 AM Medical Record Number: [id number] Patient Account Number: [id number] Date of Birth/Sex: 03-Jun-1980 (40 y.o. M) Treating RN: Levora Dredge Primary Care Provider: [name] Other Clinician: Referring Provider: [name] Treating Provider/Extender: [name] Cliche in Treatment: 238 Active Problems ICD-10 Encounter Code Description Active Date MDM Diagnosis I87.2 Venous insufficiency (chronic) (peripheral) 12/04/2016 No Yes L97.222 Non-pressure chronic ulcer of left calf with fat layer exposed 12/04/2016 No Yes E11.622 Type 2 diabetes mellitus with other skin ulcer 04/09/2021 No Yes L88 Pyoderma gangrenosum 03/26/2017 No Yes F17.208 Nicotine dependence, unspecified, with other nicotine-induced disorders 04/24/2020 No Yes Inactive Problems ICD-10 Code Description Active Date Inactive Date L97.213 Non-pressure chronic ulcer of  right calf with necrosis of muscle 04/02/2017 04/02/2017 Resolved Problems ICD-10 Code Description Active Date Resolved Date L97.321 Non-pressure chronic ulcer of left ankle limited to breakdown of skin 10/08/2019 10/08/2019 L03.116 Cellulitis of left lower limb 05/24/2019 05/24/2019 Electronic Signature(s) Signed: 07/02/2021 8:28:28 AM By: Worthy Keeler PA-C Entered By: Worthy Keeler on 07/02/2021 14:48:18 MCDONALD, REILING ([id number]) -------------------------------------------------------------------------------- Progress Note Details Patient Name: [name] Date of Service: 07/02/2021 8:15 AM Medical Record Number: [id number] Patient Account Number: [id number] Date of Birth/Sex: 11-25-1980 (40 y.o. M) Treating RN: Levora Dredge Primary Care Provider: [name] Other Clinician: Referring Provider: [name] Treating Provider/Extender: [name] Cliche in Treatment: 238 Subjective Chief Complaint Information obtained from Patient He is here in follow up evaluation for LLE pyoderma ulcer History of Present Illness (HPI) 12/04/16; 41 year old man who comes into the clinic today for review of a wound on the posterior left calf. He tells me that is been there for about a year. He is not a diabetic he does smoke half a pack per day. He was seen in the ER on 11/20/16 felt to have cellulitis around the wound and was given clindamycin. An x-ray did not show osteomyelitis. The patient initially tells me that he has a milk allergy that sets off a pruritic itching rash on his lower legs which she scratches incessantly and he thinks that's what may have set up the wound. He has been using various topical antibiotics and ointments without any effect. He works in a [employer] and is on his feet all day. He does not have a prior history of wounds however he does have the rash on both lower legs the right arm and the ventral aspect of his left arm. These are excoriations and clearly  have had scratching however there are of macular looking areas on both legs including a substantial larger area on the right leg. This does not have an underlying open area. There is no blistering. The patient tells me that 2 years ago in [place] in response to the rash on his legs he saw a dermatologist who told him he had a condition which may be pyoderma gangrenosum although I may be putting words into his mouth. He seemed to recognize this. On further questioning he admits to a 5 year history of quiesced. ulcerative colitis. He is not in any treatment for this. He's had no recent travel 12/11/16; the patient arrives today with his wound and  roughly the same condition we've been using silver alginate this is a deep punched out wound with some surrounding erythema but no tenderness. Biopsy I did did not show confirmed pyoderma gangrenosum suggested nonspecific inflammation and vasculitis but does not provide an actual description of what was seen by the pathologist. I'm really not able to understand this We have also received information from the patient's dermatologist in [place] notes from April 2016. This was a doctor Agarwal-antal. 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 He was prescribed topical steroid high potency under occlusion which helped but at this point the patient did not have a deep punched out wound. 12/18/16; the patient's wound is larger in terms of surface area however this surface looks better and there is less depth. The surrounding erythema also is better. The patient states that the wrap we put on came off 2 days ago when he has been using his compression stockings. He we are in the process of getting a dermatology consult. 12/26/16 on evaluation today patient's left lower extremity wound shows evidence of infection with surrounding erythema noted. He has been tolerating the dressing changes but states that he has noted more discomfort. There is a larger area of  erythema surrounding the wound. No fevers, chills, nausea, or vomiting noted at this time. With that being said the wound still does have slough covering the surface. He is not allergic to any medication that he is aware of at this point. In regard to his right lower extremity he had several regions that are erythematous and pruritic he wonders if there's anything we can do to help that. 01/02/17 I reviewed patient's wound culture which was obtained his visit last week. He was placed on doxycycline at that point. Unfortunately that does not appear to be an antibiotic that would likely help with the situation however the pseudomonas noted on culture is sensitive to Cipro. Also unfortunately patient's wound seems to have a large compared to last week's evaluation. Not severely so but there are definitely increased measurements in general. He is continuing to have discomfort as well he writes this to be a seven out of 10. In fact he would prefer me not to perform any debridement today due to the fact that he is having discomfort and considering he has an active infection on the little reluctant to do so anyway. No fevers, chills, nausea, or vomiting noted at this time. 01/08/17; patient seems dermatology on September 5. I suspect dermatology will want the slides from the biopsy I did sent to their pathologist. I'm not sure if there is a way we can expedite that. In any case the culture I did before I left on vacation 3 weeks ago showed Pseudomonas he was given 10 days of Cipro and per her description of her intake nurses is actually somewhat better this week although the wound is quite a bit bigger than I remember the last time I saw this. He still has 3 more days of Cipro 01/21/17; dermatology appointment tomorrow. He has completed the ciprofloxacin for Pseudomonas. Surface of the wound looks better however he is had some deterioration in the lesions on his right leg. Meantime the left lateral leg wound we  will continue with sample 01/29/17; patient had his dermatology appointment but I can't yet see that note. He is completed his antibiotics. The wound is more superficial but considerably larger in circumferential area than when he came in. This is in his left lateral calf. He also has  swollen erythematous areas with superficial wounds on the right leg and small papular areas on both arms. There apparently areas in her his upper thighs and buttocks I did not look at those. Dermatology biopsied the right leg. Hopefully will have their input next week. 02/05/17; patient went back to see his dermatologist who told him that he had a "scratching problem" as well as staph. He is now on a 30 day course of doxycycline and I believe she gave him triamcinolone cream to the right leg areas to help with the itching [not exactly sure but probably triamcinolone]. She apparently looked at the left lateral leg wound although this was not rebiopsied and I think felt to be ultimately part of the same pathogenesis. He is using sample border foam and changing nevus himself. He now has a new open area on the right posterior leg which was his biopsy site I don't have any of the dermatology notes 02/12/17; we put the patient in compression last week with SANTYL to the wound on the left leg and the biopsy. Edema is much better and the depth of the wound is now at level of skin. Area is still the same Biopsy site on the right lateral leg we've also been using santyl with a border foam dressing and he is changing this himself. 02/19/17; Using silver alginate started last week to both the substantial left leg wound and the biopsy site on the right wound. He is tolerating compression well. Has a an appointment with his primary M.D. tomorrow wondering about diuretics although I'm wondering if the edema problem is actually lymphedema AEMON, KOELLER ([id number]) 02/26/17; the patient has been to see his primary doctor Dr. Jerrel Ivory  at [place] our primary care. She started him on Lasix 20 mg and this seems to have helped with the edema. However we are not making substantial change with the left lateral calf wound and inflammation. The biopsy site on the right leg also looks stable but not really all that different. 03/12/17; the patient has been to see vein and vascular Dr. Lucky Cowboy. He has had venous reflux studies I have not reviewed these. I did get a call from his dermatology office. 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 They therefore recommended clobetasol and tetracycline i.e. aggressive treatment for possible pyoderma gangrenosum. 03/26/17; apparently the patient just had reflux studies not an appointment with Dr. dew. She arrives in clinic today having applied clobetasol for 2-3 weeks. He notes over the last 2-3 days excessive drainage having to change the dressing 3-4 times a day and also expanding erythema. 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 Lillia Carmel at [facility] dermatology locally who kindly saw him at my request. A repeat biopsy apparently has confirmed pyoderma gangrenosum and he started on prednisone 60 mg yesterday. My concern was the degree of erythema medially extending from his left leg wound which was either inflammation from pyoderma or cellulitis. I put him on Augmentin however culture  of the wound showed Pseudomonas which is quinolone sensitive. I really don't believe he has cellulitis  however in view of everything I will continue and give him a course of Cipro. He is also on doxycycline as an immune modulator for the pyoderma. In addition to his original wound on the left lateral leg with surrounding erythema he has a wound on the right posterior calf which was an original biopsy site done by dermatology. This was felt to represent pathergy from pyoderma gangrenosum 04/16/17; pyoderma gangrenosum. Saw Dr. Lillia Carmel yesterday. He has been using topical antibiotics to both wound areas his original wound on the left and the biopsies/pathergy area on the right. There is definitely some improvement in the inflammation around the wound on the right although the patient states he has increasing sensitivity of the wounds. He is on prednisone 60 and doxycycline 1 as prescribed by Dr. Lillia Carmel. He is covering the topical antibiotic with gauze and putting this in his own compression stocks and changing this daily. He states that Dr. Lottie Rater did a culture of the left leg wound yesterday 05/07/17; pyoderma gangrenosum. The patient saw Dr. Lillia Carmel yesterday and has a follow-up with her in one month. He is still using topical antibiotics to both wounds although he can't recall exactly what type. He is still on prednisone 60 mg. Dr. Lillia Carmel stated that the doxycycline could stop if we were in agreement. He has been using his own compression stocks changing daily 06/11/17; pyoderma gangrenosum with wounds on the left lateral leg and right medial leg. The right medial leg was induced by biopsy/pathergy. The area on the right is essentially healed. Still on high-dose prednisone using topical antibiotics to the wound 07/09/17; pyoderma gangrenosum with wounds on the left lateral leg. The right medial leg has closed and remains closed. He is still on prednisone 60. He tells me he missed his last dermatology appointment with Dr. Lillia Carmel but will make another appointment. He reports that her  blood sugar at a recent screen in [place] was high 200's. He was 180 today. 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 I think he is going to require still much longer prednisone perhaps another 3 months before attempting to taper. In the meantime his wound is a lot better. Smaller. He is cleaning this off daily and applying topical antibiotics. When he was last in the clinic I thought about changing to [facility] and actually put in a couple of calls to dermatology although probably not during their business hours. 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 He continues on oral prednisone. He has been using triple antibiotic ointment. There is surface debris and we will transition to [facility] and have him return in 2 weeks. He has lost 30 pounds since his last appointment with lifestyle modification. He may benefit from topical steroid cream for treatment this can be considered at a later date. 08/22/17 on evaluation today patient appears to actually be doing rather well in regard to his left lateral lower extremity ulcer. He has actually been managed by Dr. Dellia Nims most recently. Patient is currently on oral steroids at this time. This seems to have been of benefit for him. Nonetheless his last visit was actually with Leah on 08/06/17. Currently he is not utilizing any topical steroid creams although this could be of benefit as well. No  fevers, chills, nausea, or vomiting noted at this time. 09/05/17 on evaluation today patient appears to be doing better in regard to his left lateral lower extremity ulcer. He has been tolerating the dressing changes without complication. He is using Santyl with good effect. Overall I'm very pleased with how things are standing at this point. Patient likewise is happy that this is doing better. 09/19/17 on evaluation today patient actually  appears to be doing rather well in regard to his left lateral lower extremity ulcer. 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 He's not having any significant pain. 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 He has been tolerating the Santyl without complication and in general I feel like he's making good progress. 10/17/17 on evaluation today patient appears to be doing very well in regard to his left lateral lower surety ulcer. He has been tolerating the dressing changes without complication. There does not appear to be any evidence of infection he's alternating the Santyl and the triple antibiotic ointment every other day this seems to be doing well for him. 11/03/17 on evaluation today patient appears to be doing very well in regard to his left lateral lower extremity ulcer. He is been tolerating the dressing changes without complication which is good news. Fortunately there does not appear to be any evidence of infection which is also great news. Overall is doing excellent they are starting to taper down on the prednisone is down to 40 mg at this point it also started topical clobetasol for him. 11/17/17 on evaluation today patient appears to be doing well in regard to his left lateral lower surety ulcer. He's been tolerating the dressing changes without complication. He does note that he is having no pain, no excessive drainage or discharge, and overall he feels like things are going about how he would expect and hope they would. Overall he seems to have no evidence of infection at this time in my opinion which is good news. 12/04/17-He is seen in follow-up evaluation for right lateral lower extremity ulcer. He has been applying topical steroid cream. Today's measurement show slight increase in size. Over the next  2 weeks we will transition to every other day Santyl and steroid cream. He has been encouraged to monitor for changes and notify clinic with any concerns 12/15/17 on evaluation today patient's left lateral motion the ulcer and fortunately is doing worse again at this point. This just since last week to this week has close to doubled in size according to the patient. I did not seeing last week's I do not have a visual to compare this to in our system was also down so we do not have all the charts and at this point. Nonetheless it does have me somewhat concerned in regard to the fact that again he was worried enough about it he has contact the dermatology that placed them back on the full strength, 50 mg a day of the prednisone that he was taken previous. He continues to alternate using clobetasol along with Santyl at this point. He is obviously somewhat frustrated. BRIAN, ZEITLIN ([id number]) 12/22/17 on evaluation today patient appears to be doing a little worse compared to last evaluation. Unfortunately the wound is a little deeper and slightly larger than the last week's evaluation. With that being said he has made  some progress in regard to the irritation surrounding at this time unfortunately despite that progress that's been made he still has a significant issue going on here. 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 For that reason I discussed with him today the possibility of perform a wound culture to ensure there's nothing overtly infected. 01/06/18 on evaluation today patient's wound is larger and deeper than previously evaluated. With that being said it did appear that his wound was infected after my last evaluation with him. Subsequently I did end up prescribing a prescription for Bactrim DS which she has been taking and having no complication  with. Fortunately there does not appear to be any evidence of infection at this point in time as far as anything spreading, no want to touch, and overall I feel like things are showing signs of improvement. 01/13/18 on evaluation today patient appears to be even a little larger and deeper than last time. There still muscle exposed in the base of the wound. Nonetheless he does appear to be less erythematous I do believe inflammation is calming down also believe the infection looks like it's probably resolved at this time based on what I'm seeing. No fevers, chills, nausea, or vomiting noted at this time. 01/30/18 on evaluation today patient actually appears to visually look better for the most part. Unfortunately those visually this looks better he does seem to potentially have what may be an abscess in the muscle that has been noted in the central portion of the wound. This is the first time that I have noted what appears to be fluctuance in the central portion of the muscle. With that being said I'm somewhat more concerned about the fact that this might indicate an abscess formation at this location. I do believe that an ultrasound would be appropriate. This is likely something we need to try to do as soon as possible. He has been switch to mupirocin ointment and he is no longer using the steroid ointment as prescribed by dermatology he sees them again next week he's been decreased from 60 to 40 mg of prednisone. 03/09/18 on evaluation today patient actually appears to be doing a little better compared to last time I saw him. There's not as much erythema surrounding the wound itself. He I did review his most recent infectious disease note which was dated 02/24/18. He saw Dr. Michel Bickers in [place]. 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 Patient is now off of antibiotics and they are continually observing  this. There seems to be no change in the past two weeks in my pinion based on what the patient says and what I see today compared to what Dr. Megan Salon likely saw two weeks ago. No fevers, chills, nausea, or vomiting noted at this time. 03/23/18 on evaluation today patient's wound actually appears to be showing signs of improvement which is good news. He is currently still on the Dapsone. He is also working on tapering the prednisone to get off of this and Dr. Lottie Rater is working with him in this regard. Nonetheless overall I feel like the wound is doing well it does appear based on the infectious disease note that I reviewed from Dr. 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 I did also review the results of his ultrasound of left lower extremity which revealed there was a dentist tissue in the base of the wound without an abscess noted. 04/06/18 on evaluation today the patient's left lateral lower extremity ulcer actually appears to be doing fairly well which is excellent news. There does not appear to be any evidence of infection at this time which is also great news. Overall he still does have a significantly large ulceration although little by little he seems to be making progress. He is down to 10 mg a day of the prednisone. 04/20/18 on evaluation today patient actually appears to be doing excellent at this time in regard to his left lower extremity ulcer. He's making signs of good progress unfortunately this is taking much longer than we would really like to see but nonetheless he is making progress. Fortunately there does not appear to be any evidence of infection at this time. No fevers, chills, nausea, or vomiting noted at this time. The patient has not been using the Santyl due to the cost he hadn't got in this field yet. He's mainly been using the antibiotic ointment  topically. Subsequently he also tells me that he really has not been scrubbing in the shower I think this would be helpful again as I told him it doesn't have to be anything too aggressive to even make it believe just enough to keep it free of some of the loose slough and biofilm on the wound surface. 05/11/18 on evaluation today patient's wound appears to be making slow but sure progress in regard to the left lateral lower extremity ulcer. He is been tolerating the dressing changes without complication. Fortunately there does not appear to be any evidence of infection at this time. He is still just using triple antibiotic ointment along with clobetasol occasionally over the area. He never got the Santyl and really does not seem to intend to in my pinion. 06/01/18 on evaluation today patient appears to be doing a little better in regard to his left lateral lower extremity ulcer. He states that overall he does not feel like he is doing as well with the Dapsone as he did with the prednisone. Nonetheless he sees his dermatologist later today and is gonna talk to them about the possibility of going back on the prednisone. Overall again I believe that the wound would be better if you would utilize Santyl but he really does not seem to be interested in going back to the [place] at this point. He has been using triple antibiotic ointment. 06/15/18 on evaluation today patient's wound actually appears to be doing about the same at this point. Fortunately there is no signs of infection at this time. He has made slight improvements although he continues to not really want to clean the wound bed at this point. He states that he just doesn't mess with it he doesn't want to cause any problems with everything else he has going on. He has been on medication, antibiotics as prescribed by his dermatologist, for a staff infection of his lower extremities which is really drying out now and looking much better he tells  me. Fortunately there is no sign of overall infection. 06/29/18 on evaluation today patient appears to be doing well in regard to his left lateral lower surety ulcer all things considering. Fortunately his staff infection seems to be greatly improved compared to previous.  He has no signs of infection and this is drying up quite nicely. He is still the doxycycline for this is no longer on cental, Dapsone, or any of the other medications. His dermatologist has recommended possibility of an infusion but right now he does not want to proceed with that. 07/13/18 on evaluation today patient appears to be doing about the same in regard to his left lateral lower surety ulcer. Fortunately there's no signs of infection at this time which is great news. Unfortunately he still builds up a significant amount of Slough/biofilm of the surface of the wound he still is not really cleaning this as he should be appropriately. Again I'm able to easily with saline and gauze remove the majority of this on the surface which if you would do this at home would likely be a dramatic improvement for him as far as getting the area to improve. Nonetheless overall I still feel like he is making progress is just very slow. I think Santyl will be of benefit for him as well. Still he has not gotten this as of this point. 07/27/18 on evaluation today patient actually appears to be doing little worse in regards of the erythema around the periwound region of the wound he also tells me that he's been having more drainage currently compared to what he was experiencing last time I saw him. He states not quite as bad as what he had because this was infected previously but nonetheless is still appears to be doing poorly. Fortunately there is no evidence of systemic infection at this point. The patient tells me that he is not going to be able to afford the Santyl. He is still waiting to hear about the infusion therapy with his dermatologist.  Apparently she wants an updated colonoscopy first. [name], GIANNOTTI ([id number]) 08/10/18 on evaluation today patient appears to be doing better in regard to his left lateral lower extremity ulcer. Fortunately he is showing signs of improvement in this regard he's actually been approved for Remicade infusion's as well although this has not been scheduled as of yet. Fortunately there's no signs of active infection at this time in regard to the wound although he is having some issues with infection of the right lower extremity is been seen as dermatologist for this. Fortunately they are definitely still working with him trying to keep things under control. 09/07/18 on evaluation today patient is actually doing rather well in regard to his left lateral lower extremity ulcer. He notes these actually having some hair grow back on his extremity which is something he has not seen in years. He also tells me that the pain is really not giving them any trouble at this time which is also good news overall she is very pleased with the progress he's using a combination of the mupirocin along with the probate is all mixed. 09/21/18 on evaluation today patient actually appears to be doing fairly well all things considered in regard to his looks from the ulcer. He's been tolerating the dressing changes without complication. Fortunately there's no signs of active infection at this time which is good news he is still on all antibiotics or prevention of the staff infection. He has been on prednisone for time although he states it is gonna contact his dermatologist and see if she put them on a short course due to some irritation that he has going on currently. Fortunately there's no evidence of any overall worsening this is going very slow I think cental would  be something that would be helpful for him although he states that $50 for tube is quite expensive. He therefore is not willing to get that at this point. 10/06/18 on  evaluation today patient actually appears to be doing decently well in regard to his left lateral leg ulcer. He's been tolerating the dressing changes without complication. Fortunately there's no signs of active infection at this time. Overall I'm actually rather pleased with the progress he's making although it's slow he doesn't show any signs of infection and he does seem to be making some improvement. I do believe that he may need a switch up and dressings to try to help this to heal more appropriately and quickly. 10/19/18 on evaluation today patient actually appears to be doing better in regard to his left lateral lower extremity ulcer. This is shown signs of having much less Slough buildup at this point due to the fact he has been using the [employer]. Obviously this is very good news. The overall size of the wound is not dramatically smaller but again the appearance is. 11/02/18 on evaluation today patient actually appears to be doing quite well in regard to his lower [place] ulcer. A lot of the skin around the ulcer is actually somewhat irritating at this point this seems to be more due to the dressing causing irritation from the adhesive that anything else. Fortunately there is no signs of active infection at this time. 11/24/18 on evaluation today patient appears to be doing a little worse in regard to his overall appearance of his lower extremity ulcer. There's more erythema and warmth around the wound unfortunately. He is currently on doxycycline which he has been on for some time. With that being said I'm not sure that seems to be helping with what appears to possibly be an acute cellulitis with regard to his left lower extremity ulcer. No fevers, chills, nausea, or vomiting noted at this time. 12/08/18 on evaluation today patient's wounds actually appears to be doing significantly better compared to his last evaluation. He has been using Santyl along with alternating tripling about appointment as  well as the steroid cream seems to be doing quite well and the wound is showing signs of improvement which is excellent news. Fortunately there's no evidence of infection and in fact his culture came back negative with only normal skin flora noted. 12/21/2018 upon evaluation today patient actually appears to be doing excellent with regard to his ulcer. This is actually the best that I have seen it since have been helping to take care of him. It is both smaller as well as less slough noted on the surface of the wound and seems to be showing signs of good improvement with new skin growing from the edges. He has been using just the triamcinolone he does wonder if he can get a refill of that ointment today. 01/04/2019 upon evaluation today patient actually appears to be doing well with regard to his left lateral lower extremity ulcer. With that being said it does not appear to be that he is doing quite as well as last time as far as progression is concerned. There does not appear to be any signs of infection or significant irritation which is good news. With that being said I do believe that he may benefit from switching to a collagen based dressing based on how clean The wound appears. 01/18/2019 on evaluation today patient actually appears to be doing well with regard to his wound on the left lower extremity.  He is not made a lot of progress compared to where we were previous but nonetheless does seem to be doing okay at this time which is good news. There is no signs of active infection which is also good news. My only concern currently is I do wish we can get him into utilizing the collagen dressing his insurance would not pay for the supplies that we ordered although it appears that he may be able to order this through his supply company that he typically utilizes. This is [employer]. Nonetheless he did try to order it during the office visit today and it appears this did go through. We will see if he  can get that it is a different brand but nonetheless he has collagen and I do think will be beneficial. 02/01/2019 on evaluation today patient actually appears to be doing a little worse today in regard to the overall size of his wounds. Fortunately there is no signs of active infection at this time. That is visually. Nonetheless when this is happened before it was due to infection. For that reason were somewhat concerned about that this time as well. 02/08/2019 on evaluation today patient unfortunately appears to be doing slightly worse with regard to his wound upon evaluation today. Is measuring a little deeper and a little larger unfortunately. I am not really sure exactly what is causing this to enlarge he actually did see his dermatologist she is going to see about initiating Humira for him. Subsequently she also did do steroid injections into the wound itself in the periphery. Nonetheless still nonetheless he seems to be getting a little bit larger he is gone back to just using the steroid cream topically which I think is appropriate. I would say hold off on the collagen for the time being is definitely a good thing to do. Based on the culture results which we finally did get the final result back regarding it shows staph as the bacteria noted again that can be a normal skin bacteria based on the fact however he is having increased drainage and worsening of the wound measurement wise I would go ahead and place him on an antibiotic today I do believe for this. 02/15/2019 on evaluation today patient actually appears to be doing somewhat better in regard to his ulcer. There is no signs of worsening at this time I did review his culture results which showed evidence of Staphylococcus aureus but not MRSA. Again this could just be more related to the normal skin bacteria although he states the drainage has slowed down quite a bit he may have had a mild infection not just colonization. And was much  smaller and then since around10/04/2019 on evaluation today patient appears to be doing unfortunately worse as far as the size of the wound. I really feel like that this is steadily getting larger again it had been doing excellent right at the beginning of September we have seen a steady increase in the area of the wound it is almost 2-1/2 times the size it was on September 1. Obviously this is a bad trend this is not wanting to see. For that reason we went back to using just the topical triamcinolone cream which does seem to help with inflammation. I checked him for bacteria by way of culture and nothing showed positive there. I am considering giving him a short course of a tapering steroid Emanuell Morina, Odell ([id number]) today to see if that is can be beneficial for him. The patient  is in agreement with giving that a try. 03/08/2019 on evaluation today patient appears to be doing very well in comparison to last evaluation with regard to his lower extremity ulcer. This is showing signs of less inflammation and actually measuring slightly smaller compared to last time every other week over the past month and a half he has been measuring larger larger larger. Nonetheless I do believe that the issue has been inflammation the prednisone does seem to have been beneficial for him which is good news. No fevers, chills, nausea, vomiting, or diarrhea. 03/22/2019 on evaluation today patient appears to be doing about the same with regard to his leg ulcer. He has been tolerating the dressing changes without complication. With that being said the wound seems to be mostly arrested at its current size but really is not making any progress except for when we prescribed the prednisone. He did show some signs of dropping as far as the overall size of the wound during that interval week. Nonetheless this is something he is not on long-term at this point and unfortunately I think he is getting need either this or else  the Humira which his dermatologist has discussed try to get approval for. With that being said he will be seeing his dermatologist on the 11th of this month that is November. 04/19/2019 on evaluation today patient appears to be doing really about the same the wound is measuring slightly larger compared to last time I saw him. He has not been into the office since November 2 due to the fact that he unfortunately had Covid as that his entire family. He tells me that it was rough but they did pull-through and he seems to be doing much better. Fortunately there is no signs of active infection at this time. No fevers, chills, nausea, vomiting, or diarrhea. 05/10/2019 on evaluation today patient unfortunately appears to be doing significantly worse as compared to last time I saw him. He does tell me that he has had his first dose of Humira and actually is scheduled to get the next one in the upcoming week. With that being said he tells me also that in the past several days he has been having a lot of issues with green drainage she showed me a picture this is more blue-green in color. He is also been having issues with increased sloughy buildup and the wound does appear to be larger today. Obviously this is not the direction that we want everything to take based on the starting of his Humira. Nonetheless I think this is definitely a result of likely infection and to be honest I think this is probably Pseudomonas causing the infection based on what I am seeing. 05/24/2019 on evaluation today patient unfortunately appears to be doing significantly worse compared to his prior evaluation with me 2 weeks ago. I did review his culture results which showed that he does have Staph aureus as well as Pseudomonas noted on the culture. Nonetheless the Levaquin that I prescribed for him does not appear to have been appropriate and in fact he tells me he is no longer experiencing the green drainage and discharge that he  had at the last visit. Fortunately there is no signs of active infection at this time which is good news although the wound has significantly worsened it in fact is much deeper than it was previous. We have been utilizing up to this point triamcinolone ointment as the prescription topical of choice but at this time I really  feel like that the wound is getting need to be packed in order to appropriately manage this due to the deeper nature of the wound. Therefore something along the lines of an alginate dressing may be more appropriate. 05/31/2019 upon inspection today patient's wound actually showed signs of doing poorly at this point. Unfortunately he just does not seem to be making any good progress despite what we have tried. He actually did go ahead and pick up the Cipro and start taking that as he was noticing more green drainage he had previously completed the Levaquin that I prescribed for him as well. Nonetheless he missed his appointment for the seventh last week on Wednesday with the wound care center and [facility] where his dermatologist referred him. Obviously I do think a second opinion would be helpful at this point especially in light of the fact that the patient seems to be doing so poorly despite the fact that we have tried everything that I really know how at this point. The only thing that ever seems to have helped him in the past is when he was on high doses of continual steroids that did seem to make a difference for him. 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 06/07/2019 upon evaluation today patient unfortunately appears to be doing worse yet again with regard to his wound. In fact I am starting to question whether or not he may have a fluid pocket in the muscle at this point based on the bulging and the soft  appearance to the central portion of the muscle area. There is not anything draining from the muscle itself at this time which is good news but nonetheless the wound is expanding. I am not really seeing any results of the Humira as far as overall wound progression based on what I am seeing at this point. The patient has been referred for second opinion with regard to his wound to the [facility] wound care center by his dermatologist which I definitely am not in opposition to. Unfortunately we tried multiple dressings in the past including collagen, alginate, and at one point even Hydrofera Blue. With that being said he is never really used it for any significant amount of time due to the fact that he often complains of pain associated with these dressings and then will go back to either using the Santyl which she has done intermittently or more frequently the triamcinolone. He is also using his own compression stockings. We have wrapped him in the past but again that was something else that he really was not a big fan of. 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 He seemed to do much better when he was on the steroids although honestly I understand that the steroids are not necessarily the best medication to be on long-term obviously 06/14/2019 on evaluation today patient appears to be doing actually a little bit better with regard to the overall appearance with his leg. Unfortunately  he does continue to have issues with what appears to be some fluid underneath the muscle although he did see the wound specialty center at [facility] last week their main goals were to see about infusion therapy in  place of the Humira as they feel like that is not quite strong enough. They also recommended that we continue with the treatment otherwise as we are they felt like that was appropriate and they are okay with him continuing to follow-up here with [place] in that regard. With that being said they are also sending him to the vein specialist there to see about vein stripping and if that would be of benefit for him. Subsequently they also did not really address whether or not an ultrasound of the muscle area to see if there is anything that needs to be addressed here would be appropriate or not. For that reason I discussed this with him last week I think we may proceed down that road at this point. 06/21/2019 upon evaluation today patient's wound actually appears to be doing slightly better compared to previous evaluations. I do believe that he has made a difference with regard to the progression here with the use of oral steroids. Again in the past has been the only thing that is really calm things down. He does tell me that from [facility] is gotten a good news from there that there are no further vein stripping that is necessary at this point. I do not have that available for review today although the patient did relay this to me. He also did obtain and have the ultrasound of the wound completed which I did sign off on today. It does appear that there is no fluid collection under the muscle this is likely then just edematous tissue in general. That is also good news. Overall I still believe the inflammation is the main issue here. He did inquire about the possibility of a wound VAC again with the muscle protruding like it is I am not really sure whether the wound VAC is necessarily ideal or not. That is something we will have to consider although I do believe he may need compression wrapping to try to help with edema control which could potentially be of benefit. 06/28/2019 on evaluation today patient appears to be  doing slightly better measurement wise although this is not terribly smaller he least seems to be trending towards that direction. With that being said he still seems to have purulent drainage noted in the wound bed at this time. He has been on Levaquin followed by Cipro over the past month. Unfortunately he still seems to have some issues with active infection at this time. I did perform a culture last week in order to evaluate and see if indeed there was still anything going on. Subsequently the culture did come back Gaughran, Christoher ([id number]) showing Pseudomonas which is consistent with the drainage has been having which is blue-green in color. He also has had an odor that again was somewhat consistent with Pseudomonas as well. Long story short it appears that the culture showed an intermediate finding with regard to how well the Cipro will work for the Pseudomonas infection. Subsequently being that he does not seem to be clearing up and at best what we are doing is just keeping this at [place] I think he may need to see infectious disease to discuss IV antibiotic options. 07/05/2019 upon evaluation today patient appears to be doing okay in regard to his leg ulcer. He has  been tolerating the dressing changes at this point without complication. Fortunately there is no signs of active infection at this time which is good news. No fevers, chills, nausea, vomiting, or diarrhea. With that being said he does have an appointment with infectious disease tomorrow and his primary care on Wednesday. Again the reason for the infectious disease referral was due to the fact that he did not seem to be fully resolving with the use of oral antibiotics and therefore we were thinking that IV antibiotic therapy may be necessary secondary to the fact that there was an intermediate finding for how effective the Cipro may be. Nonetheless again he has been having a lot of purulent and even green drainage. Fortunately right now  that seems to have calmed down over the past week with the reinitiation of the oral antibiotic. Nonetheless we will see what Dr. Megan Salon has to say. 07/12/2019 upon evaluation today patient appears to be doing about the same at this point in regard to his left lower extremity ulcer. Fortunately there is no signs of active infection at this time which is good news I do believe the Levaquin has been beneficial I did review Dr. Hale Bogus note and to be honest I agree that the patient's leg does appear to be doing better currently. What we found in the past as he does not seem to really completely resolve he will stop the antibiotic and then subsequently things will revert back to having issues with blue-green drainage, increased pain, and overall worsening in general. Obviously that is the reason I sent him back to infectious disease. 07/19/2019 upon evaluation today patient appears to be doing roughly the same in size there is really no dramatic improvement. He has started back on the Levaquin at this point and though he seems to be doing okay he did still have a lot of blue/green drainage noted on evaluation today unfortunately. I think that this is still indicative more likely of a Pseudomonas infection as previously noted and again he does see Dr. Megan Salon in just a couple of days. I do not know that were really able to effectively clear this with just oral antibiotics alone based on what I am seeing currently. Nonetheless we are still continue to try to manage as best we can with regard to the patient and his wound. I do think the wrap was helpful in decreasing the edema which is excellent news. No fevers, chills, nausea, vomiting, or diarrhea. 07/26/2019 upon evaluation today patient appears to be doing slightly better with regard to the overall appearance of the muscle there is no dark discoloration centrally. Fortunately there is no signs of active infection at this time. No fevers, chills, nausea,  vomiting, or diarrhea. Patient's wound bed currently the patient did have an appointment with Dr. Megan Salon at infectious disease last week. With that being said Dr. Megan Salon the patient states was still somewhat hesitant about put him on any IV antibiotics he wanted [place] to repeat cultures today and then see where things go going forward. He does look like Dr. Megan Salon because of some improvement the patient did have with the Levaquin wanted [place] to see about repeating cultures. If it indeed grows the Pseudomonas again then he recommended a possibility of considering a PICC line placement and IV antibiotic therapy. He plans to see the patient back in 1 to 2 weeks. 08/02/2019 upon evaluation today patient appears to be doing poorly with regard to his left lower extremity. We did get the results  of his culture back it shows that he is still showing evidence of Pseudomonas which is consistent with the purulent/blue-green drainage that he has currently. Subsequently the culture also shows that he now is showing resistance to the oral fluoroquinolones which is unfortunate as that was really the only thing to treat the infection prior. I do believe that he is looking like this is going require IV antibiotic therapy to get this under control. Fortunately there is no signs of systemic infection at this time which is good news. The patient does see Dr. Megan Salon tomorrow. 08/09/2019 upon evaluation today patient appears to be doing better with regard to his left lower extremity ulcer in regard to the overall appearance. He is currently on IV antibiotic therapy. As ordered by Dr. Megan Salon. Currently the patient is on ceftazidime which she is going to take for the next 2 weeks and then follow-up for 4 to 5-week appointment with Dr. Megan Salon. The patient started this this past Friday symptoms have not for a total of 3 days currently in full. 08/16/2019 upon evaluation today patient's wound actually does show muscle in  the base of the wound but in general does appear to be much better as far as the overall evidence of infection is concerned. In fact I feel like this is for the most part cleared up he still on the IV antibiotics he has not completed the full course yet but I think he is doing much better which is excellent news. 08/23/2019 upon evaluation today patient appears to be doing about the same with regard to his wound at this point. He tells me that he still has pain unfortunately. Fortunately there is no evidence of systemic infection at this time which is great news. There is significant muscle protrusion. 09/13/19 upon evaluation today patient appears to be doing about the same in regard to his leg unfortunately. He still has a lot of drainage coming from the ulceration there is still muscle exposed. With that being said the patient's last wound culture still showed an intermediate finding with regard to the Pseudomonas he still having the bluish/green drainage as well. Overall I do not know that the wound has completely cleared of infection at this point. Fortunately there is no signs of active infection systemically at this point which is good news. 09/20/2019 upon evaluation today patient's wound actually appears to be doing about the same based on what I am seeing currently. I do not see any signs of systemic infection he still does have evidence of some local infection and drainage. He did see Dr. Megan Salon last week and Dr. Megan Salon states that he probably does need a different IV antibiotic although he does not want to put him on this until the patient begins the Remicade infusion which is actually scheduled for about 10 days out from today on 13 May. Following that time Dr. Megan Salon is good to see him back and then will evaluate the feasibility of starting him on the IV antibiotic therapy once again at that point. I do not disagree with this plan I do believe as Dr. Megan Salon stated in his note that I  reviewed today that the patient's issue is multifactorial with the pyoderma being 1 aspect of this that were hoping the Remicade will be helpful for her. In the meantime I think the gentamicin is, helping to keep things under decent okay control in regard to the ulcer. 09/27/2019 upon evaluation today patient appears to be doing about the same with regard  to his wound still there is a lot of muscle exposure though he does have some hyper granulation tissue noted around the edge and actually some granulation tissue starting to form over the muscle which is actually good news. Fortunately there is no evidence of active infection which is also good news. His pain is less at this point. 5/21; this is a patient I have not seen in a long time. 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 He has a large wound on the left lateral leg with protruding muscle. He comes in the clinic today showing the same area on his left medial ankle. He says there is been a spot there for some time although we have not previously defined this. Today he has a clearly defined area with slight amount of skin breakdown surrounded by raised areas with a purplish hue in color. This is not painful he says it is irritated. 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 He still has muscle protruding from the lateral portion of his left leg but fortunately the new area that they were concerned about at his last visit does not appear to have opened at this point. He is currently on Remicade infusions and seems to be doing better in my opinion in fact the wound itself seems to be overall much better. The purplish discoloration that he did have seems to have resolved and I think that is a good sign that hopefully the Remicade is doing its job. He does Burson, Lochlann ([id number]) have some biofilm noted  over the surface of the wound. 11/01/2019 on evaluation today patient's wound actually appears to be doing excellent at this time. Fortunately there is no evidence of active infection and overall I feel like he is making great progress. The Remicade seems to be due excellent job in my opinion. 11/08/19 evaluation today vision actually appears to be doing quite well with regard to his weight ulcer. He's been tolerating dressing changes without complication. Fortunately there is no evidence of infection. No fevers, chills, nausea, or vomiting noted at this time. Overall states that is having more itching than pain which is actually a good sign in my opinion. 12/13/2019 upon evaluation today patient appears to be doing well today with regard to his wound. He has been tolerating the dressing changes without complication. Fortunately there is no sign of active infection at this time. No fevers, chills, nausea, vomiting, or diarrhea. Overall I feel like the infusion therapy has been very beneficial for him. 01/06/2020 on evaluation today patient appears to be doing well with regard to his wound. This is measuring smaller and actually looks to be doing better. Fortunately there is no signs of active infection at this point. No fevers, chills, nausea, vomiting, or diarrhea. With that being said he does still have the blue-green drainage but this does not seem to be causing any significant issues currently. He has been using the gentamicin that does seem to be keeping things under decent control at this point. He goes later this morning for his next infusion therapy for the pyoderma which seems to also be very beneficial. 02/07/2020 on evaluation today patient appears to be doing about the same in regard to his wounds currently. Fortunately there is no signs of active infection systemically he does still have evidence of local infection still using gentamicin. He also is showing some signs of improvement albeit  slowly I do feel like we are making some progress here. 02/21/2020 upon evaluation today patient appears to be making some signs of improvement the wound is measuring a little bit smaller which is great news and overall I am very pleased with where he stands currently. He is going to be having infusion therapy treatment on the 15th of this month. Fortunately there is no signs of active infection at this time. 03/13/2020 I do believe patient's wound is actually showing some signs of improvement here which is great news. He has continue with the infusion therapy through rheumatology/dermatology at [facility]. That does seem to be beneficial. I still think he gets as much benefit from this as he did from the prednisone initially but nonetheless obviously this is less harsh on his body that the prednisone as far as they are concerned. 03/31/2020 on evaluation today patient's wound actually showing signs of some pretty good improvement in regard to the overall appearance of the wound bed. There is still muscle exposed though he does have some epithelial growth around the edges of the wound. Fortunately there is no signs of active infection at this time. No fevers, chills, nausea, vomiting, or diarrhea. 04/24/2020 upon evaluation today patient appears to be doing about the same in regard to his leg ulcer. He has been tolerating the dressing changes without complication. Fortunately there is no signs of active infection at this time. No fevers, chills, nausea, vomiting, or diarrhea. With that being said he still has a lot of irritation from the bandaging around the edges of the wound. We did discuss today the possibility of a referral to plastic surgery. 05/22/2020 on evaluation today patient appears to be doing well with regard to his wounds all things considered. He has not been able to get the Chantix apparently there is a recall nurse that I was unaware of put out by [employer] involuntarily. Nonetheless for now I am  and I have to do some research into what may be the best option for him to help with quitting in regard to smoking and we discussed that today. 06/26/2020 upon evaluation today patient appears to be doing well with regard to his wound from the standpoint of infection I do not see any signs of infection at this point. With that being said unfortunately he is still continuing to have issues with muscle exposure and again he is not having a whole lot of new skin growth unfortunately. There does not appear to be any signs of active infection at this time. No fevers, chills, nausea, vomiting, or diarrhea. 07/10/2020 upon evaluation today patient appears to be doing a little bit more poorly currently compared to where he was previous. I am concerned currently about an active infection that may be getting worse especially in light of the increased size and tenderness of the wound bed. No fevers, chills, nausea, vomiting, or diarrhea. 07/24/2020 upon evaluation today patient appears to be doing poorly in regard to his leg ulcer. He has been tolerating the dressing changes without complication but unfortunately is having a lot of discomfort. 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 Subsequently I think he is going require possibly IV antibiotics to get this under control. I am very concerned about the severity of his infection and the amount of discomfort he is having. 07/31/2020 upon evaluation today patient appears to be doing about the same in regard to his leg wound. He did see Dr. Megan Salon and Dr.  Megan Salon is actually going to start him on IV antibiotics. He goes for the PICC line tomorrow. With that being said there do not have that run for 2 weeks and then see how things are doing and depending on how he is progressing they may extend that a little longer. Nonetheless I am glad this is getting ready to be in place and definitely feel it  may help the patient. In the meantime is been using mainly triamcinolone to the wound bed has an anti-inflammatory. 08/07/2020 on evaluation today patient appears to be doing well with regard to his wound compared even last week. In the interim he has gotten the PICC line placed and overall this seems to be doing excellent. There does not appear to be any evidence of infection which is great news systemically although locally of course has had the infection this appears to be improving with the use of the antibiotics. 08/14/2020 upon evaluation today patient's wound actually showing signs of excellent improvement. Overall the irritation has significantly improved the drainage is back down to more of a normal level and his pain is really pretty much nonexistent compared to what it was. Obviously I think that this is significantly improved secondary to the IV antibiotic therapy which has made all the difference in the world. Again he had a resistant form of Pseudomonas for which oral antibiotics just was not cutting it. Nonetheless I do think that still we need to consider the possibility of a surgical closure for this wound is been open so long and to be honest with muscle exposed I think this can be very hard to get this to close outside of this although definitely were still working to try to do what we can in that regard. 08/21/2020 upon evaluation today patient appears to be doing very well with regard to his wounds on the left lateral lower extremity/calf area. Fortunately there does not appear to be signs of active infection which is great news and overall very pleased with where things stand today. He is actually wrapping up his treatment with IV antibiotics tomorrow. After that we will see where things go from there. 08/28/2020 upon evaluation today patient appears to be doing decently well with regard to his leg ulcer. There does not appear to be any signs of Flater, Aydian ([id number]) active  infection which is great news and overall very pleased with where things stand today. No fevers, chills, nausea, vomiting, or diarrhea. 09/18/2020 upon evaluation today patient appears to be doing well with regard to his infection which I feel like is better. Unfortunately he is not doing as well with regard to the overall size of the wound which is not nearly as good at this point. I feel like that he may be having an issue here with the pyoderma being somewhat out of control. I think that he may benefit from potentially going back and talking to the dermatologist about what to do from the pyoderma standpoint. I am not certain if the infusions are helping nearly as much is what the prednisone did in the past. 10/02/2020 upon evaluation today patient appears to be doing well with regard to his leg ulcer. He did go to the [facility]iatric nurse. Unfortunately they feel like there is a 10% chance that most that he would be able to heal and that the skin graft would take. Obviously this has led him to not be able to go down that path as far as treatment is concerned. Nonetheless he does  seem to be doing a little bit better with the prednisone that I gave him last time. I think that he may need to discuss with dermatology the possibility of long-term prednisone as that seems to be what is most helpful for him to be perfectly honest. I am not sure the Remicade is really doing the job. 10/17/2020 upon evaluation today patient appears to be doing a little better in regard to his wound. In fact the case has been since we did the prednisone on May 2 for him that we have noticed a little bit of improvement each time we have seen a size wise as well as appearance wise as well as pain wise. I think the prednisone has had a greater effect then the infusion therapy has to be perfectly honest. With that being said the patient also feels significantly better compared to what he was previous. All of this is good news but  nonetheless I am still concerned about the fact that again we are really not set up to long-term manage him as far as prednisone is concerned. Obviously there are things that you need to be watched I completely understand the risk of prednisone usage as well. 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 With all that being said I do believe that we can give him another round of the prednisone which she is requesting today because of the improvement that he seen since we did that first round. 10/30/2020 upon evaluation today patient's wound actually is showing signs of doing quite well. There does not appear to be any evidence of infection which is great news and overall very pleased with where things stand today. No fevers, chills, nausea, vomiting, or diarrhea. He tells me that the prednisone still has seem to have helped he wonders if we can extend that for just a little bit longer. He did not have the appointment with a dermatologist although he did have an infusion appointment last Friday. That was at [facility]. With that being said he tells me he could not do both that as well as the appointment with the physician on the same day therefore that is can have to be rescheduled. I really want to see if there is anything they feel like that could be done differently to try to help this out as I am not really certain that the infusions are helping significantly here. 11/13/2020 upon evaluation today patient unfortunately appears to be doing somewhat poorly in regard to his wound I feel like this is actually worsening from the standpoint of the pyoderma spreading. I still feel like that he may need something different as far as trying to manage this going forward. Again we did the prednisone unfortunately his blood sugars are not doing so well because of this. Nonetheless I believe that the patient likely needs to try topical steroid. We have done triamcinolone for a  while I think going with something stronger such as clobetasol could be beneficial again this is not something I do lightly I discussed this with the patient that again this does not normally put underneath an occlusive dressing. Nonetheless I think a thin film as such could help with some of the stronger anti-inflammatory effects. We discussed this today. He would like to try to give this a trial for the next couple weeks. I definitely think that is something that we can do. Evaluate7/03/2021 and today patient's wound bed actually showed signs of doing  really about the same. There was a little expansion of the size of the wound and that leading edge that we done looking out although the clobetasol does seem to have slowed this down a bit in my opinion. There is just 1 small area that still seems to be progressing based on what I see. Nonetheless I am concerned about the fact this does not seem to be improving if anything seems to be doing a little bit worse. I do not know that the infusions are really helping him much as next infusion is August 5 his appointment with dermatology is July 25. Either way I really think that we need to have a conversation potentially about this and I am actually going to see if I can talk with Dr. Lillia Carmel in order to see where things stand as well. 12/11/2020 upon evaluation today patient appears to be doing worse in regard to his leg ulcer. Unfortunately I just do not think this is making the progress that I would like to see at this point. Honestly he does have an appointment with dermatology and this is in 2 days. I am wondering what they may have to offer to help with this. Right now what I am seeing is that he is continuing to show signs of worsening little by little. Obviously that is not great at all. Is the exact opposite of what we are looking for. 12/18/2020 upon evaluation today patient appears to be doing a little better in regard to his wound. The  dermatologist actually did do some steroid injections into the wound which does seem to have been beneficial in my opinion. That was on the 25th already this looks a little better to me than last time I saw him. With that being said we did do a culture and this did show that he has Staph aureus noted in abundance in the wound. With that being said I do think that getting him on an oral antibiotic would be appropriate as well. Also think we can compression wrap and this will make a difference as well. 12/28/2020 upon evaluation today patient's wound is actually showing signs of doing much better. I do believe the compression wrap is helping he has a lot of drainage but to be honest I think that the compression is helping to some degree in this regard as well as not draining through which is also good news. No fevers, chills, nausea, vomiting, or diarrhea. 01/04/2021 upon evaluation today patient appears to be doing well with regard to his wound. Overall things seem to be doing quite well. He did have a little bit of reaction to the CarboFlex Sorbact he will be using that any longer. With that being said he is controlled as far as the drainage is concerned overall and seems to be doing quite well. I do not see any signs of active infection at this time which is great news. No fevers, chills, nausea, vomiting, or diarrhea. 01/11/2021 upon evaluation today patient appears to be doing well with regard to his wounds. He has been tolerating the dressing changes without complication. Fortunately there does not appear to be any signs of active infection at this time which is great news. Overall I am extremely pleased with where we stand currently. No fevers, chills, nausea, vomiting, or diarrhea. Where using clobetasol in the wound bed he has a lot of new skin growth which is awesome as well. 01/18/2021 upon evaluation today patient appears to be doing very well in regard  to his leg ulcer. He has been tolerating  the dressing changes without complication. Fortunately there does not appear to be any signs of active infection which is great news. In general I think that he is making excellent progress 01/25/2021 upon evaluation today patient appears to be doing well with regard to his wound on the leg. I am actually extremely pleased with where things stand today. There does not appear to be any signs of active infection which is great news and overall I think that we are definitely headed in the appropriate direction based on what I am seeing currently. There does not appear to be any signs of active infection also excellent news. 02/06/2021 upon evaluation today patient appears to be doing well with regard to his wound. Overall visually this is showing signs of significant Coltrin, Jamarien ([id number]) improvement which is great news. I do not see any signs of active infection systemically which is great even locally I do not think that we are seeing any major complications here. We did do fluorescence imaging with the [employer] DX today. The patient does have some odor and drainage noted and again this is something that I think would benefit him to probably come more frequently for nurse visits. 02/19/2021 upon evaluation today patient actually appears to be doing quite well in regard to his wound. He has been tolerating the dressing changes without complication and overall I think that this is making excellent progress. I do not see any evidence of active infection at this point which is great news as well. No fevers, chills, nausea, vomiting, or diarrhea. 10/10; wound is made nice progress healthy granulation with a nice rim of epithelialization which seems to be expanding even from last week he has a deeper area in the inferior part of the more distal part of the wound with not quite as healthy as surface. This area will need to be followed. Using clobetasol and Hydrofera Blue 03/05/2021 upon evaluation today  patient appears to be doing very well in regard to his leg ulcer. He has been tolerating dressing changes without complication. Fortunately there does not appear to be any signs of active infection which is great news and overall I am extremely pleased with where we stand currently. 03/12/2021 upon evaluation today patient appears to be doing well with regard to his wound in fact this is extremely extremely good based on what we are seeing today there does not appear to be any signs of active infection and overall I think that he is doing awesome from the standpoint of healing in general. I am extremely pleased with how things seem to be progressing with regard to this pyoderma. Clobetasol has done wonders for him. I think the compression wrapping has also been of great benefit. 03/19/2021 upon evaluation today patient appears to be doing well with regard to his wound. He is tolerating the dressing changes without complication. In fact I feel like that he is actually making excellent progress at this point based on what I am seeing. No fevers, chills, nausea, vomiting, or diarrhea. 03/26/2021 upon evaluation today patient appears to be doing well with regard to his wound. This again is measuring smaller and looking better. Again the progress is slow but nonetheless continual with what we have been seeing. I do believe that the current plan is doing awesome for him. 04/09/2021 upon evaluation today patient appears to be doing well with regard to his leg ulcer. This is showing signs of excellent improvement the muscle  is completely closed over and there does not appear to be any evidence of inflammation at this point his drainage is significantly improved. Overall I think that he would be a good candidate for looking into a skin substitute at this point as well. We will get a look into some approvals in that regard. Potentially TheraSkin as well as Apligraf could both be considered just depending on  insurance coverage. 04/16/2021 upon evaluation today patient appears to be doing well at this point. He has been approved for the Apligraf which we could definitely order although I would like to try to get the TheraSkin approved if at all possible. I did fax notes into them today and I Georgina Peer try to give a call as well. Overall the wound appears to be doing decently well today. 04/20/2021 upon evaluation today patient actually appears to be doing quite well in regard to his wound with that being said we are trying to see what we can do about speeding up the healing process. For that reason we did discuss the possibility of a skin substitute. We got the Apligraf approved. For that reason we will get a go ahead and see what we can do with the Apligraf at this point. I am still trying to get the TheraSkin approved but I have not heard anything from the insurance company yet I have called and talked to them earlier in the week and to be honest this was about half an hour that I spent on the phone they told me I would hear something within 10-15 business days. 04/27/2021 upon evaluation today patient appears to be doing excellent in regard to his wound. I do believe the Apligraf has been beneficial. With that being said he has had a little bit of increased pain has been a little bit concerned. I do want to go ahead and apply his steroid cream today the clobetasol and then put the Apligraf over I just do not think I want to risk not putting the clobetasol on based on what is been going on here currently. Patient voiced understanding. 05/04/2021 upon evaluation today patient is making excellent improvement. Overall there is definitely decrease in the size of the wound today and I am very pleased in that regard. I do not see any signs of active infection locally nor systemically at this point. No fevers, chills, nausea, vomiting, or diarrhea. 05/11/2021 upon evaluation today patient appears to be doing well  with regard to his wound. He has been tolerating Apligraf's without complication and is making excellent progress this is application #4 today. 12/30; patient here for Apligraf application. Appears to be doing well small wound there has been a lot of healing 05/28/2021 upon evaluation today patient appears to be doing well with regard to his wound. Has been tolerating the dressing changes without complication. Fortunately I do not see any signs of active infection locally nor systemically at this point which is great news. He is done with the Apligraf and the first round and overall this has filled in quite nicely I am not even certain he needs any additional at the moment. I would actually try to go back to clobetasol with [facility] which previously was doing well for him. 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 He is using clobetasol and Hydrofera Blue. Surface area of the wound is smaller and the surface looks very healthy. 06/11/2021 upon evaluation today patient actually appears to be showing signs of  good improvement which is great news. Fortunately I do not see any signs of active infection at this time which is also excellent news. I do believe that the patient is doing well with the clobetasol and Hydrofera Blue. He does have some irritation and itching around the rest of the leg will use some triamcinolone over this area. 06/18/2021 upon evaluation today patient appears to be doing well with regard to his wound. He is showing signs of excellent improvement and overall I am extremely pleased with where we stand today. We are moving in the right direction. 06/25/2021 upon evaluation today patient appears to be doing well with regard to his wound. In fact this is doing also not showing signs of excellent improvement overall very pleased with where we stand today. I do not see any evidence of active infection locally or systemically at this  time which is also great news. 07/02/2021 upon evaluation patient's wound bed actually showed signs of good granulation and epithelization at this point. And this is measuring significantly smaller and overall I am extremely pleased with where we stand today. No fevers, chills, nausea, vomiting, or diarrhea. CARTEL, MAUSS ([id number]) Objective Constitutional Well-nourished and well-hydrated in no acute distress. Vitals Time Taken: 8:11 AM, Height: 71 in, Weight: 338 lbs, BMI: 47.1, Temperature: 97.9 F, Pulse: 85 bpm, Respiratory Rate: 18 breaths/min, Blood Pressure: 140/85 mmHg. Respiratory normal breathing without difficulty. Psychiatric this patient is able to make decisions and demonstrates good insight into disease process. Alert and Oriented x 3. pleasant and cooperative. General Notes: Upon inspection patient's wound bed actually is showing signs of excellent improvement. This is showing a little bit of smaller measurement overall and I am very pleased in that regard. I am hopeful this will continue to improve week by week as we go forward. Integumentary (Hair, Skin) Wound #1 status is Open. Original cause of wound was Gradually Appeared. The date acquired was: 11/18/2015. The wound has been in treatment 238 weeks. The wound is located on the Left,Lateral Lower Leg. The wound measures 2.5cm length x 1.4cm width x 0.1cm depth; 2.749cm^2 area and 0.275cm^3 volume. There is Fat Layer (Subcutaneous Tissue) exposed. There is no tunneling or undermining noted. There is a medium amount of serosanguineous drainage noted. The wound margin is flat and intact. There is large (67-100%) red, pink granulation within the wound bed. There is a small (1-33%) amount of necrotic tissue within the wound bed including Adherent Slough. 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 Nurse Visit as needed - twice a week Bathing/ Shower/ Hygiene: Clean wound with Normal Saline or wound cleanser. May shower with wound dressing protected with water repellent cover or cast protector. Edema Control - Lymphedema / Segmental Compressive Device / Other: Optional: One layer of unna paste to top of compression wrap (to act as an anchor). Elevate, Exercise Daily and Avoid Standing for Long Periods of Time. Elevate legs to the level of the heart and pump ankles as often as possible Elevate leg(s) parallel to the floor when sitting. WOUND #1: - Lower Leg Wound Laterality: Left, Lateral Cleanser: Wound Cleanser 3 x Per Week/30 Days Discharge Instructions: Wash your hands with soap and water. Remove old dressing, discard into plastic bag and place into trash. Cleanse the wound with Wound Cleanser prior  to applying a clean dressing using gauze sponges, not tissues or cotton balls. Do not scrub or use excessive force. Pat dry using gauze sponges, not tissue or cotton balls. Peri-Wound Care: Moisturizing Lotion 3 x Per Week/30 Days Discharge Instructions: Suggestions: Theraderm, Eucerin, Cetaphil, or patient preference. Topical: Clobetasol Propionate ointment 0.05%, 60 (g) tube 3 x Per Week/30 Days Discharge Instructions: Pt to bring to appt- Topical: Triamcinolone Acetonide Cream, 0.1%, 15 (g) tube 3 x Per Week/30 Days Discharge Instructions: Apply as directed by provider. To leg not on wound Darrah, Kaydence ([id number]) Primary Dressing: Hydrofera Blue Ready Transfer Foam, 4x5 (in/in) 3 x Per Week/30 Days Discharge Instructions: Apply Hydrofera Blue Ready to wound bed as directed Secondary Dressing: Zetuvit Plus Silicone Non-bordered 5x5 (in/in) 3 x Per Week/30 Days Compression Wrap: Medichoice 4 layer Compression  System, 35-40 mmHG (Generic) 3 x Per Week/30 Days Discharge Instructions: Apply multi-layer wrap as directed. 1. Would recommend that we going to continue with the wound care measures as before and the patient is in agreement the plan. This includes the use of the [facility] along with the clobetasol. 2. I am also can recommend that we have the patient continue with the Zetuvit dressing to cover. 3. We will continue with the 4-layer compression wrap. We will see patient back for reevaluation in 1 week here in the clinic. If anything worsens or changes patient will contact our office for additional recommendations. Electronic Signature(s) Signed: 07/02/2021 8:47:27 AM By: Worthy Keeler PA-C Entered By: Worthy Keeler on 07/02/2021 08:47:27 JUSTINIAN, MIANO ([id number]) -------------------------------------------------------------------------------- SuperBill Details Patient Name: [name] Date of Service: 07/02/2021 Medical Record Number: [id number] Patient Account Number: [id number] Date of Birth/Sex: [birth year] (40 y.o. M) Treating RN: Levora Dredge Primary Care Provider: [name] Other Clinician: Referring Provider: [name] Treating Provider/Extender: [name] Cliche in Treatment: 238 Diagnosis Coding ICD-10 Codes Code Description I87.2 Venous insufficiency (chronic) (peripheral) L97.222 Non-pressure chronic ulcer of left calf with fat layer exposed E11.622 Type 2 diabetes mellitus with other skin ulcer L88 Pyoderma gangrenosum F17.208 Nicotine dependence, unspecified, with other nicotine-induced disorders Facility Procedures CPT4 Code: 39767341 Description: (Facility Use Only) [phone] - [place] LWR LT LEG Modifier: Quantity: 1 Physician Procedures CPT4 Code: 0973532 Description: 99242 - WC PHYS LEVEL 4 - EST PT Modifier: Quantity: 1 CPT4 Code: Description: ICD-10 Diagnosis Description I87.2 Venous insufficiency (chronic) (peripheral)  L97.222 Non-pressure chronic ulcer of left calf with fat layer exposed E11.622 Type 2 diabetes mellitus with other skin ulcer L88 Pyoderma gangrenosum Modifier: Quantity: Electronic Signature(s) Signed: 07/02/2021 9:31:21 AM By: Worthy Keeler PA-C Signed: 07/02/2021 4:20:39 PM By: Levora Dredge Previous Signature: 07/02/2021 8:48:05 AM Version By: Worthy Keeler PA-C Entered By: Levora Dredge on 07/02/2021 08:54:10

## 2021-07-02 NOTE — Progress Notes (Signed)
[name], [name] ([id number]) Visit Report for 07/02/2021 Arrival Information Details Patient Name: [name], [name] Date of Service: 07/02/2021 8:15 AM Medical Record Number: [id number] Patient Account Number: [id number] Date of Birth/Sex: 12-28-1980 (41 y.o. M) Treating RN: Levora Dredge Primary Care Judithann Villamar: [name] Other Clinician: Referring Obera Stauch: [name] Treating Devlin Mcveigh/Extender: [name] Cliche in Treatment: 30 Visit Information History Since Last Visit Added or deleted any medications: No Patient Arrived: Ambulatory Any new allergies or adverse reactions: No Arrival Time: 08:07 Had a fall or experienced change in No Accompanied By: self activities of daily living that may affect Transfer Assistance: None risk of falls: Patient Identification Verified: Yes Hospitalized since last visit: No Secondary Verification Process Completed: Yes Has Dressing in Place as Prescribed: Yes Patient Requires Transmission-Based No Has Compression in Place as Prescribed: Yes Precautions: Pain Present Now: No Patient Has Alerts: Yes Patient Alerts: Patient has reaction to silver dressings. Electronic Signature(s) Signed: 07/02/2021 4:20:39 PM By: Levora Dredge Entered By: Levora Dredge on 07/02/2021 08:10:12 [name] ([id number]) -------------------------------------------------------------------------------- Clinic Level of Care Assessment Details Patient Name: [name], [name] Date of Service: 07/02/2021 8:15 AM Medical Record Number: [id number] Patient Account Number: [id number] Date of Birth/Sex: 11/05/1980 (41 y.o. M) Treating RN: Levora Dredge Primary Care Shaniqua Guillot: [name] Other Clinician: Referring Katelen Luepke: [name] Treating Azahel Belcastro/Extender: [name] Cliche in Treatment: 238 Clinic Level of Care Assessment Items TOOL 1 Quantity Score []  - Use when EandM and Procedure is performed on INITIAL visit 0 ASSESSMENTS - Nursing  Assessment / Reassessment []  - General Physical Exam (combine w/ comprehensive assessment (listed just below) when performed on new 0 pt. evals) []  - 0 Comprehensive Assessment (HX, ROS, Risk Assessments, Wounds Hx, etc.) ASSESSMENTS - Wound and Skin Assessment / Reassessment []  - Dermatologic / Skin Assessment (not related to wound area) 0 ASSESSMENTS - Ostomy and/or Continence Assessment and Care []  - Incontinence Assessment and Management 0 []  - 0 Ostomy Care Assessment and Management (repouching, etc.) PROCESS - Coordination of Care []  - Simple Patient / Family Education for ongoing care 0 []  - 0 Complex (extensive) Patient / Family Education for ongoing care []  - 0 Staff obtains Programmer, systems, Records, Test Results / Process Orders []  - 0 Staff telephones HHA, Nursing Homes / Clarify orders / etc []  - 0 Routine Transfer to another Facility (non-emergent condition) []  - 0 Routine Hospital Admission (non-emergent condition) []  - 0 New Admissions / Biomedical engineer / Ordering NPWT, Apligraf, etc. []  - 0 Emergency Hospital Admission (emergent condition) PROCESS - Special Needs []  - Pediatric / Minor Patient Management 0 []  - 0 Isolation Patient Management []  - 0 Hearing / Language / Visual special needs []  - 0 Assessment of Community assistance (transportation, D/C planning, etc.) []  - 0 Additional assistance / Altered mentation []  - 0 Support Surface(s) Assessment (bed, cushion, seat, etc.) INTERVENTIONS - Miscellaneous []  - External ear exam 0 []  - 0 Patient Transfer (multiple staff / Civil Service fast streamer / Similar devices) []  - 0 Simple Staple / Suture removal (25 or less) []  - 0 Complex Staple / Suture removal (26 or more) []  - 0 Hypo/Hyperglycemic Management (do not check if billed separately) []  - 0 Ankle / Brachial Index (ABI) - do not check if billed separately Has the patient been seen at the hospital within the last three years: Yes Total Score: 0 Level Of  Care: ____ [name] ([id number]) Electronic Signature(s) Signed: 07/02/2021 4:20:39 PM By: Levora Dredge Entered By: Levora Dredge on 07/02/2021 08:53:56 [name], [name] ([id number]) --------------------------------------------------------------------------------  Compression Therapy Details Patient Name: [name], [name] Date of Service: 07/02/2021 8:15 AM Medical Record Number: [id number] Patient Account Number: [id number] Date of Birth/Sex: 1980/12/26 (41 y.o. M) Treating RN: Levora Dredge Primary Care Kayla [name]: [name] Other Clinician: Referring Armari Fussell: [name] Treating Eliaz Fout/Extender: [name] Cliche in Treatment: 238 Compression Therapy Performed for Wound Assessment: Wound #1 Left,Lateral Lower Leg Performed By: Clinician Levora Dredge, RN Compression Type: Four Layer Pre Treatment ABI: 1.2 Post Procedure Diagnosis Same as Pre-procedure Electronic Signature(s) Signed: 07/02/2021 4:20:39 PM By: Levora Dredge Entered By: Levora Dredge on 07/02/2021 08:53:20 [name] ([id number]) -------------------------------------------------------------------------------- Encounter Discharge Information Details Patient Name: [name] Date of Service: 07/02/2021 8:15 AM Medical Record Number: [id number] Patient Account Number: [id number] Date of Birth/Sex: 08/19/1980 (40 y.o. M) Treating RN: Levora Dredge Primary Care Dontrae Morini: [name] Other Clinician: Referring Ireanna Finlayson: [name] Treating Syrenity Klepacki/Extender: [name] Cliche in Treatment: 238 Encounter Discharge Information Items Discharge Condition: Stable Ambulatory Status: Ambulatory Discharge Destination: Home Transportation: Private Auto Accompanied By: self Schedule Follow-up Appointment: Yes Clinical Summary of Care: Electronic Signature(s) Signed: 07/02/2021 4:20:39 PM By: Levora Dredge Entered By: Levora Dredge on 07/02/2021 08:55:10 [name]  ([id number]) -------------------------------------------------------------------------------- Lower Extremity Assessment Details Patient Name: [name] Date of Service: 07/02/2021 8:15 AM Medical Record Number: [id number] Patient Account Number: [id number] Date of Birth/Sex: [birth year] (40 y.o. M) Treating RN: Levora Dredge Primary Care Ellon Marasco: [name] Other Clinician: Referring Braxtin Bamba: [name] Treating Karolyn Messing/Extender: [name] Cliche in Treatment: 238 Edema Assessment Assessed: [Left: No] [Right: No] Edema: [Left: Ye] [Right: s] Calf Left: Right: Point of Measurement: 35 cm From Medial Instep 47 cm Ankle Left: Right: Point of Measurement: 10 cm From Medial Instep 28.2 cm Vascular Assessment Pulses: Dorsalis Pedis Palpable: [Left:Yes] Posterior Tibial Palpable: [Left:Yes] Electronic Signature(s) Signed: 07/02/2021 4:20:39 PM By: Levora Dredge Entered By: Levora Dredge on 07/02/2021 08:20:40 [name] ([id number]) -------------------------------------------------------------------------------- Multi Wound Chart Details Patient Name: [name] Date of Service: 07/02/2021 8:15 AM Medical Record Number: [id number] Patient Account Number: [id number] Date of Birth/Sex: 05/23/1980 (40 y.o. M) Treating RN: Levora Dredge Primary Care Dafney Farler: [name] Other Clinician: Referring Tamey Wanek: [name] Treating Vencent Hauschild/Extender: [name] Cliche in Treatment: 238 Vital Signs Height(in): 71 Pulse(bpm): 85 Weight(lbs): 338 Blood Pressure(mmHg): 140/85 Body Mass Index(BMI): 47.1 Temperature(F): 97.9 Respiratory Rate(breaths/min): 18 Photos: [N/A:N/A] Wound Location: Left, Lateral Lower Leg N/A N/A Wounding Event: Gradually Appeared N/A N/A Primary Etiology: Pyoderma N/A N/A Comorbid History: Sleep Apnea, Hypertension, Colitis N/A N/A Date Acquired: 11/18/2015 N/A N/A Weeks of Treatment: 238 N/A N/A Wound Status: Open N/A  N/A Wound Recurrence: No N/A N/A Measurements L x W x D (cm) 2.5x1.4x0.1 N/A N/A Area (cm) : 2.749 N/A N/A Volume (cm) : 0.275 N/A N/A % Reduction in Area: 44.00% N/A N/A % Reduction in Volume: 93.00% N/A N/A Classification: Full Thickness With Exposed N/A N/A Support Structures Exudate Amount: Medium N/A N/A Exudate Type: Serosanguineous N/A N/A Exudate Color: red, brown N/A N/A Wound Margin: Flat and Intact N/A N/A Granulation Amount: Large (67-100%) N/A N/A Granulation Quality: Red, Pink N/A N/A Necrotic Amount: Small (1-33%) N/A N/A Exposed Structures: Fat Layer (Subcutaneous Tissue): N/A N/A Yes Fascia: No Tendon: No Muscle: No Joint: No Bone: No Epithelialization: Medium (34-66%) N/A N/A Treatment Notes Electronic Signature(s) Signed: 07/02/2021 4:20:39 PM By: Levora Dredge Entered By: Levora Dredge on 07/02/2021 08:35:42 [name] ([id number]) -------------------------------------------------------------------------------- Multi-Disciplinary Care Plan Details Patient Name: [name] Date of Service: 07/02/2021 8:15 AM Medical Record Number: [id number] Patient Account Number: [id number]  Date of Birth/Sex: 24-May-1980 (41 y.o. M) Treating RN: Levora Dredge Primary Care Lachlan Pelto: [name] Other Clinician: Referring Jerson Furukawa: [name] Treating Meyli Boice/Extender: [name] Cliche in Treatment: [place] reviewed with physician Active Inactive Necrotic Tissue Nursing Diagnoses: Impaired tissue integrity related to necrotic/devitalized tissue Knowledge deficit related to management of necrotic/devitalized tissue Goals: Necrotic/devitalized tissue will be minimized in the wound bed Date Initiated: 03/26/2021 Target Resolution Date: 05/26/2021 Goal Status: Active Patient/caregiver will verbalize understanding of reason and process for debridement of necrotic tissue Date Initiated: 03/26/2021 Date Inactivated:  04/27/2021 Target Resolution Date: 03/26/2021 Goal Status: Met Interventions: Assess patient pain level pre-, during and post procedure and prior to discharge Provide education on necrotic tissue and debridement process Treatment Activities: Apply topical anesthetic as ordered : 03/26/2021 Notes: Electronic Signature(s) Signed: 07/02/2021 4:20:39 PM By: Levora Dredge Entered By: Levora Dredge on 07/02/2021 08:35:26 [name] ([id number]) -------------------------------------------------------------------------------- Pain Assessment Details Patient Name: [name] Date of Service: 07/02/2021 8:15 AM Medical Record Number: [id number] Patient Account Number: [id number] Date of Birth/Sex: March 19, 1981 (40 y.o. M) Treating RN: Levora Dredge Primary Care Adrienne Trombetta: [name] Other Clinician: Referring Keyira Mondesir: [name] Treating Karthikeya Funke/Extender: [name] Cliche in Treatment: 238 Active Problems Location of Pain Severity and Description of Pain Patient Has Paino No Site Locations Rate the pain. Current Pain Level: 0 Pain Management and Medication Current Pain Management: Electronic Signature(s) Signed: 07/02/2021 4:20:39 PM By: Levora Dredge Entered By: Levora Dredge on 07/02/2021 08:11:34 [name] ([id number]) -------------------------------------------------------------------------------- Patient/Caregiver Education Details Patient Name: [name] Date of Service: 07/02/2021 8:15 AM Medical Record Number: [id number] Patient Account Number: [id number] Date of Birth/Gender: 1981-01-18 (40 y.o. M) Treating RN: Levora Dredge Primary Care Physician: [name] Other Clinician: Referring Physician: [name] Treating Physician/Extender: [name] Cliche in Treatment: 238 Education Assessment Education Provided To: Patient Education Topics Provided Wound/Skin Impairment: Handouts: Caring for Your Ulcer Methods:  Explain/Verbal Responses: State content correctly Electronic Signature(s) Signed: 07/02/2021 4:20:39 PM By: Levora Dredge Entered By: Levora Dredge on 07/02/2021 08:54:29 [name] ([id number]) -------------------------------------------------------------------------------- Wound Assessment Details Patient Name: [name] Date of Service: 07/02/2021 8:15 AM Medical Record Number: [id number] Patient Account Number: [id number] Date of Birth/Sex: [birth year] (40 y.o. M) Treating RN: Levora Dredge Primary Care Dwayna Kentner: [name] Other Clinician: Referring Braelynne Garinger: [name] Treating Irish Piech/Extender: [name] Cliche in Treatment: 238 Wound Status Wound Number: 1 Primary Etiology: Pyoderma Wound Location: Left, Lateral Lower Leg Wound Status: Open Wounding Event: Gradually Appeared Comorbid History: Sleep Apnea, Hypertension, Colitis Date Acquired: 11/18/2015 Weeks Of Treatment: 238 Clustered Wound: No Photos Wound Measurements Length: (cm) 2.5 Width: (cm) 1.4 Depth: (cm) 0.1 Area: (cm) 2.749 Volume: (cm) 0.275 % Reduction in Area: 44% % Reduction in Volume: 93% Epithelialization: Medium (34-66%) Tunneling: No Undermining: No Wound Description Classification: Full Thickness With Exposed Support Structures Wound Margin: Flat and Intact Exudate Amount: Medium Exudate Type: Serosanguineous Exudate Color: red, brown Foul Odor After Cleansing: No Slough/Fibrino Yes Wound Bed Granulation Amount: Large (67-100%) Exposed Structure Granulation Quality: Red, Pink Fascia Exposed: No Necrotic Amount: Small (1-33%) Fat Layer (Subcutaneous Tissue) Exposed: Yes Necrotic Quality: Adherent Slough Tendon Exposed: No Muscle Exposed: No Joint Exposed: No Bone Exposed: No Treatment Notes Wound #1 (Lower Leg) Wound Laterality: Left, Lateral Cleanser Wound Cleanser Discharge Instruction: Wash your hands with soap and water. Remove old dressing, discard into  plastic bag and place into trash. Cleanse the wound with Wound Cleanser prior to applying a clean dressing using gauze sponges, not tissues or cotton  balls. Do not scrub or use excessive force. Pat dry using gauze sponges, not tissue or cotton balls. [name], [name] ([id number]) [place] Lotion Discharge Instruction: Suggestions: Theraderm, Eucerin, Cetaphil, or patient preference. Topical Clobetasol Propionate ointment 0.05%, 60 (g) tube Discharge Instruction: Pt to bring to appt- Triamcinolone Acetonide Cream, 0.1%, 15 (g) tube Discharge Instruction: Apply as directed by Brice Kossman. To leg not on wound Primary Dressing Hydrofera Blue Ready Transfer Foam, 4x5 (in/in) Discharge Instruction: Apply Hydrofera Blue Ready to wound bed as directed Secondary Dressing Zetuvit Plus Silicone Non-bordered 5x5 (in/in) Secured With Compression Wrap Medichoice 4 layer Compression System, 35-40 mmHG Discharge Instruction: Apply multi-layer wrap as directed. Compression Stockings Add-Ons Electronic Signature(s) Signed: 07/02/2021 4:20:39 PM By: Levora Dredge Entered By: Levora Dredge on 07/02/2021 08:21:38 [name], [name] ([id number]) -------------------------------------------------------------------------------- Vitals Details Patient Name: [name] Date of Service: 07/02/2021 8:15 AM Medical Record Number: [id number] Patient Account Number: [id number] Date of Birth/Sex: 28-Feb-1981 (40 y.o. M) Treating RN: Levora Dredge Primary Care Drequan Ironside: [name] Other Clinician: Referring Brylen Wagar: [name] Treating Llesenia Fogal/Extender: [name] Cliche in Treatment: 238 Vital Signs Time Taken: 08:11 Temperature (F): 97.9 Height (in): 71 Pulse (bpm): 85 Weight (lbs): 338 Respiratory Rate (breaths/min): 18 Body Mass Index (BMI): 47.1 Blood Pressure (mmHg): 140/85 Reference Range: 80 - 120 mg / dl Electronic Signature(s) Signed: 07/02/2021 4:20:39 PM By:  Levora Dredge Entered By: Levora Dredge on 07/02/2021 08:11:26

## 2021-07-02 NOTE — Progress Notes (Signed)
DENY, CHEVEZ ([id number]) Visit Report for 06/18/2021 Arrival Information Details Patient Name: [name], [name] Date of Service: 06/18/2021 8:15 AM Medical Record Number: [id number] Patient Account Number: [id number] Date of Birth/Sex: 13-Dec-1980 (41 y.o. M) Treating RN: [name] Primary Care Crystel Demarco: [name] Other Clinician: Referring Chapel Silverthorn: [name] Treating Alverda Nazzaro/Extender: [name] Cliche in Treatment: 76 Visit Information History Since Last Visit Added or deleted any medications: No Patient Arrived: Ambulatory Has Dressing in Place as Prescribed: Yes Arrival Time: 08:13 Has Compression in Place as Prescribed: Yes Transfer Assistance: None Pain Present Now: No Patient Identification Verified: Yes Secondary Verification Process Completed: Yes Patient Requires Transmission-Based No Precautions: Patient Has Alerts: Yes Patient Alerts: Patient has reaction to silver dressings. Electronic Signature(s) Signed: 07/02/2021 7:47:47 AM By: [name], [name], RN, [name], [name], [name] Entered By: [name], [name], RN, [name], [name] on 06/18/2021 08:13:42 [name], [name] ([id number]) -------------------------------------------------------------------------------- Encounter Discharge Information Details Patient Name: [name] Date of Service: 06/18/2021 8:15 AM Medical Record Number: [id number] Patient Account Number: [id number] Date of Birth/Sex: December 11, 1980 (41 y.o. M) Treating RN: [name] Primary Care Jillian Pianka: [name] Other Clinician: Referring Dagon Budai: [name] Treating Ryne Mctigue/Extender: [name] Cliche in Treatment: 236 Encounter Discharge Information Items Discharge Condition: Stable Ambulatory Status: Ambulatory Discharge Destination: Home Transportation: Private Auto Accompanied By: self Schedule Follow-up Appointment: Yes Clinical Summary of Care: Electronic Signature(s) Signed: 07/02/2021 7:47:47 AM By: [name], [name], RN, [name], [name], [name] Entered  By: [name], [name], RN, [name], [name] on 06/18/2021 08:54:45 [name] ([id number]) -------------------------------------------------------------------------------- Lower Extremity Assessment Details Patient Name: [name] Date of Service: 06/18/2021 8:15 AM Medical Record Number: [id number] Patient Account Number: [id number] Date of Birth/Sex: [birth year] (41 y.o. M) Treating RN: [name] Primary Care Eduardo Honor: [name] Other Clinician: Referring Lucero Auzenne: [name] Treating Athelene Hursey/Extender: [name] Cliche in Treatment: 236 Edema Assessment Assessed: [Left: No] [Right: No] [Left: Edema] [Right: :] Calf Left: Right: Point of Measurement: 35 cm From Medial Instep 48.5 cm Ankle Left: Right: Point of Measurement: 10 cm From Medial Instep 30 cm Vascular Assessment Pulses: Dorsalis Pedis Palpable: [Left:Yes] Electronic Signature(s) Signed: 07/02/2021 7:47:47 AM By: [name], [name], RN, [name], [name], [name] Entered By: [name], [name], RN, [name], [name] on 06/18/2021 08:25:47 [name] ([id number]) -------------------------------------------------------------------------------- Multi Wound Chart Details Patient Name: [name] Date of Service: 06/18/2021 8:15 AM Medical Record Number: [id number] Patient Account Number: [id number] Date of Birth/Sex: November 04, 1980 (41 y.o. M) Treating RN: [name] Primary Care Brien Lowe: [name] Other Clinician: Referring Doaa Kendzierski: [name] Treating Devlyn Parish/Extender: [name] Cliche in Treatment: 236 Vital Signs Height(in): 71 Pulse(bpm): 90 Weight(lbs): 338 Blood Pressure(mmHg): 127/84 Body Mass Index(BMI): 47.1 Temperature(F): 98.5 Respiratory Rate(breaths/min): 16 Photos: [N/A:N/A] Wound Location: Left, Lateral Lower Leg N/A N/A Wounding Event: Gradually Appeared N/A N/A Primary Etiology: Pyoderma N/A N/A Comorbid History: Sleep Apnea, Hypertension, Colitis N/A N/A Date Acquired: 11/18/2015 N/A N/A Weeks of  Treatment: 236 N/A N/A Wound Status: Open N/A N/A Wound Recurrence: No N/A N/A Measurements L x W x D (cm) 2.6x2x0.1 N/A N/A Area (cm) : 4.084 N/A N/A Volume (cm) : 0.408 N/A N/A % Reduction in Area: 16.80% N/A N/A % Reduction in Volume: 89.60% N/A N/A Classification: Full Thickness With Exposed N/A N/A Support Structures Exudate Amount: Medium N/A N/A Exudate Type: Serosanguineous N/A N/A Exudate Color: red, brown N/A N/A Wound Margin: Flat and Intact N/A N/A Granulation Amount: Large (67-100%) N/A N/A Granulation Quality: Red, Pink N/A N/A Necrotic Amount: Small (1-33%) N/A N/A Exposed Structures: Fat Layer (Subcutaneous Tissue): N/A N/A  Yes Fascia: No Tendon: No Muscle: No Joint: No Bone: No Epithelialization: Medium (34-66%) N/A N/A Treatment Notes Electronic Signature(s) Signed: 07/02/2021 7:47:47 AM By: [name], [name], RN, [name], [name], [name] Entered By: [name], [name], RN, [name], [name] on 06/18/2021 08:38:02 [name], [name] ([id number]) -------------------------------------------------------------------------------- Multi-Disciplinary Care Plan Details Patient Name: [name] Date of Service: 06/18/2021 8:15 AM Medical Record Number: [id number] Patient Account Number: [id number] Date of Birth/Sex: Apr 19, 1981 (41 y.o. M) Treating RN: [name] Primary Care Natara Monfort: [name] Other Clinician: Referring Marolyn Urschel: [name] Treating Fionnuala Hemmerich/Extender: [name] Cliche in Treatment: [place] reviewed with physician Active Inactive Necrotic Tissue Nursing Diagnoses: Impaired tissue integrity related to necrotic/devitalized tissue Knowledge deficit related to management of necrotic/devitalized tissue Goals: Necrotic/devitalized tissue will be minimized in the wound bed Date Initiated: 03/26/2021 Target Resolution Date: 05/26/2021 Goal Status: Active Patient/caregiver will verbalize understanding of reason and process for debridement of  necrotic tissue Date Initiated: 03/26/2021 Date Inactivated: 04/27/2021 Target Resolution Date: 03/26/2021 Goal Status: Met Interventions: Assess patient pain level pre-, during and post procedure and prior to discharge Provide education on necrotic tissue and debridement process Treatment Activities: Apply topical anesthetic as ordered : 03/26/2021 Notes: Electronic Signature(s) Signed: 07/02/2021 7:47:47 AM By: [name], [name], RN, [name], [name], [name] Entered By: [name], [name], RN, [name], [name] on 06/18/2021 08:37:24 [name] ([id number]) -------------------------------------------------------------------------------- Pain Assessment Details Patient Name: [name] Date of Service: 06/18/2021 8:15 AM Medical Record Number: [id number] Patient Account Number: [id number] Date of Birth/Sex: 15-May-1981 (41 y.o. M) Treating RN: [name] Primary Care Davon Folta: [name] Other Clinician: Referring Shenice Dolder: [name] Treating Ajahni Nay/Extender: [name] Cliche in Treatment: 236 Active Problems Location of Pain Severity and Description of Pain Patient Has Paino No Site Locations Pain Management and Medication Current Pain Management: Notes Patient denies pain at this time. Electronic Signature(s) Signed: 07/02/2021 7:47:47 AM By: [name], [name], RN, [name], [name], [name] Entered By: [name], [name], RN, [name], [name] on 06/18/2021 08:14:19 [name] ([id number]) -------------------------------------------------------------------------------- Patient/Caregiver Education Details Patient Name: [name] Date of Service: 06/18/2021 8:15 AM Medical Record Number: [id number] Patient Account Number: [id number] Date of Birth/Gender: June 28, 1980 (41 y.o. M) Treating RN: [name] Primary Care Physician: [name] Other Clinician: Referring Physician: [name] Treating Physician/Extender: [name] Cliche in Treatment: 35 Education Assessment Education Provided  To: Patient Education Topics Provided Venous: Handouts: Controlling Swelling with Multilayered Compression Wraps Methods: Demonstration, Explain/Verbal Responses: State content correctly Electronic Signature(s) Signed: 07/02/2021 7:47:47 AM By: [name], [name], RN, [name], [name], [name] Entered By: [name], [name], RN, [name], [name] on 06/18/2021 08:53:45 [name] ([id number]) -------------------------------------------------------------------------------- Wound Assessment Details Patient Name: [name] Date of Service: 06/18/2021 8:15 AM Medical Record Number: [id number] Patient Account Number: [id number] Date of Birth/Sex: 09-Nov-1980 (41 y.o. M) Treating RN: [name] Primary Care Tamee Battin: [name] Other Clinician: Referring Kaithlyn Teagle: [name] Treating Valina Maes/Extender: [name] Cliche in Treatment: 236 Wound Status Wound Number: 1 Primary Etiology: Pyoderma Wound Location: Left, Lateral Lower Leg Wound Status: Open Wounding Event: Gradually Appeared Comorbid History: Sleep Apnea, Hypertension, Colitis Date Acquired: 11/18/2015 Weeks Of Treatment: 236 Clustered Wound: No Photos Wound Measurements Length: (cm) 2.6 Width: (cm) 2 Depth: (cm) 0.1 Area: (cm) 4.084 Volume: (cm) 0.408 % Reduction in Area: 16.8% % Reduction in Volume: 89.6% Epithelialization: Medium (34-66%) Tunneling: No Undermining: No Wound Description Classification: Full Thickness With Exposed Support Structures Wound Margin: Flat and Intact Exudate Amount: Medium Exudate Type: Serosanguineous Exudate Color: red, brown Foul Odor After Cleansing: No Slough/Fibrino Yes  Wound Bed Granulation Amount: Large (67-100%) Exposed Structure Granulation Quality: Red, Pink Fascia Exposed: No Necrotic Amount: Small (1-33%) Fat Layer (Subcutaneous Tissue) Exposed: Yes Necrotic Quality: Adherent Slough Tendon Exposed: No Muscle Exposed: No Joint Exposed: No Bone Exposed: No Electronic  Signature(s) Signed: 07/02/2021 7:47:47 AM By: [name], [name], RN, [name], [name], [name] Entered By: [name], [name], RN, [name], [name] on 06/18/2021 08:22:24 [name] ([id number]) -------------------------------------------------------------------------------- Vitals Details Patient Name: [name] Date of Service: 06/18/2021 8:15 AM Medical Record Number: [id number] Patient Account Number: [id number] Date of Birth/Sex: 1980-11-10 (41 y.o. M) Treating RN: [name] Primary Care Takira Sherrin: [name] Other Clinician: Referring Tarence Searcy: [name] Treating Chon Buhl/Extender: [name] Cliche in Treatment: 236 Vital Signs Time Taken: 08:13 Temperature (F): 98.5 Height (in): 71 Pulse (bpm): 90 Weight (lbs): 338 Respiratory Rate (breaths/min): 16 Body Mass Index (BMI): 47.1 Blood Pressure (mmHg): 127/84 Reference Range: 80 - 120 mg / dl Electronic Signature(s) Signed: 07/02/2021 7:47:47 AM By: [name], [name], RN, [name], [name], [name] Entered By: [name], [name], RN, [name], [name] on 06/18/2021 08:14:03

## 2021-07-04 ENCOUNTER — Other Ambulatory Visit: Payer: Self-pay

## 2021-07-04 DIAGNOSIS — I872 Venous insufficiency (chronic) (peripheral): Secondary | ICD-10-CM | POA: Diagnosis not present

## 2021-07-04 NOTE — Progress Notes (Signed)
CASTIN, DONAGHUE ([id number]) Visit Report for 07/04/2021 Arrival Information Details Patient Name: [name], [name] Date of Service: 07/04/2021 8:00 AM Medical Record Number: [id number] Patient Account Number: [id number] Date of Birth/Sex: 14-Jun-1980 (41 y.o. M) Treating RN: Donnamarie Poag Primary Care Loucinda Croy: [name] Other Clinician: Referring Alicen Donalson: [name] Treating Sumedha Munnerlyn/Extender: Yaakov Guthrie in Treatment: 80 Visit Information History Since Last Visit Added or deleted any medications: No Patient Arrived: Ambulatory Had a fall or experienced change in No Arrival Time: 08:04 activities of daily living that may affect Accompanied By: self risk of falls: Transfer Assistance: None Hospitalized since last visit: No Patient Identification Verified: Yes Has Dressing in Place as Prescribed: Yes Secondary Verification Process Completed: Yes Has Compression in Place as Prescribed: Yes Patient Requires Transmission-Based No Pain Present Now: No Precautions: Patient Has Alerts: Yes Patient Alerts: Patient has reaction to silver dressings. Electronic Signature(s) Signed: 07/04/2021 12:16:10 PM By: Donnamarie Poag Entered By: Donnamarie Poag on 07/04/2021 08:19:55 [name] ([id number]) -------------------------------------------------------------------------------- Compression Therapy Details Patient Name: [name] Date of Service: 07/04/2021 8:00 AM Medical Record Number: [id number] Patient Account Number: [id number] Date of Birth/Sex: 1980-09-29 (40 y.o. M) Treating RN: Donnamarie Poag Primary Care Maison Kestenbaum: [name] Other Clinician: Referring Daysy Santini: [name] Treating Keeshia Sanderlin/Extender: Yaakov Guthrie in Treatment: 239 Compression Therapy Performed for Wound Assessment: Wound #1 Left,Lateral Lower Leg Performed By: Junius Argyle, RN Compression Type: Four Layer Electronic Signature(s) Signed: 07/04/2021 12:16:10 PM By:  Donnamarie Poag Entered By: Donnamarie Poag on 07/04/2021 08:22:10 [name] ([id number]) -------------------------------------------------------------------------------- Encounter Discharge Information Details Patient Name: [name] Date of Service: 07/04/2021 8:00 AM Medical Record Number: [id number] Patient Account Number: [id number] Date of Birth/Sex: [birth year] (40 y.o. M) Treating RN: Donnamarie Poag Primary Care Kavin Weckwerth: [name] Other Clinician: Referring Clarece Drzewiecki: [name] Treating Dub Maclellan/Extender: Yaakov Guthrie in Treatment: 14 Encounter Discharge Information Items Discharge Condition: Stable Ambulatory Status: Ambulatory Discharge Destination: Home Transportation: Private Auto Accompanied By: self Schedule Follow-up Appointment: Yes Clinical Summary of Care: Electronic Signature(s) Signed: 07/04/2021 8:24:56 AM By: Donnamarie Poag Entered By: Donnamarie Poag on 07/04/2021 08:24:56 Kanno, [name] ([id number]) -------------------------------------------------------------------------------- Wound Assessment Details Patient Name: [name] Date of Service: 07/04/2021 8:00 AM Medical Record Number: [id number] Patient Account Number: [id number] Date of Birth/Sex: [birth year] (40 y.o. M) Treating RN: Donnamarie Poag Primary Care Sarea Fyfe: [name] Other Clinician: Referring Aston Lieske: [name] Treating Masin Shatto/Extender: Yaakov Guthrie in Treatment: 239 Wound Status Wound Number: 1 Primary Etiology: Pyoderma Wound Location: Left, Lateral Lower Leg Wound Status: Open Wounding Event: Gradually Appeared Comorbid History: Sleep Apnea, Hypertension, Colitis Date Acquired: 11/18/2015 Weeks Of Treatment: 239 Clustered Wound: No Wound Measurements Length: (cm) 2.5 Width: (cm) 1.4 Depth: (cm) 0.1 Area: (cm) 2.749 Volume: (cm) 0.275 % Reduction in Area: 44% % Reduction in Volume: 93% Epithelialization: Medium (34-66%) Wound  Description Classification: Full Thickness With Exposed Support Structures Wound Margin: Flat and Intact Exudate Amount: Medium Exudate Type: Serosanguineous Exudate Color: red, brown Foul Odor After Cleansing: No Slough/Fibrino Yes Wound Bed Granulation Amount: Large (67-100%) Exposed Structure Granulation Quality: Red, Pink Fascia Exposed: No Necrotic Amount: Small (1-33%) Fat Layer (Subcutaneous Tissue) Exposed: Yes Necrotic Quality: Adherent Slough Tendon Exposed: No Muscle Exposed: No Joint Exposed: No Bone Exposed: No Treatment Notes Wound #1 (Lower Leg) Wound Laterality: Left, Lateral Cleanser Wound Cleanser Discharge Instruction: Wash your hands with soap and water. Remove old dressing, discard into plastic bag and place into trash. Cleanse the wound with Wound Cleanser prior to applying a clean dressing using gauze sponges,  not tissues or cotton balls. Do not scrub or use excessive force. Pat dry using gauze sponges, not tissue or cotton balls. Peri-Wound Care Moisturizing Lotion Discharge Instruction: Suggestions: Theraderm, Eucerin, Cetaphil, or patient preference. Topical Clobetasol Propionate ointment 0.05%, 60 (g) tube Discharge Instruction: Pt to bring to appt- Triamcinolone Acetonide Cream, 0.1%, 15 (g) tube Discharge Instruction: Apply as directed by Ziggy Chanthavong. To leg not on wound Primary Dressing Hydrofera Blue Ready Transfer Foam, 4x5 (in/in) Discharge Instruction: Apply Hydrofera Blue Ready to wound bed as directed AMERY, MINASYAN ([id number]) Secondary Dressing Zetuvit Plus Silicone Non-bordered 5x5 (in/in) Secured With Compression Wrap Medichoice 4 layer Compression System, 35-40 mmHG Discharge Instruction: Apply multi-layer wrap as directed. Compression Stockings Add-Ons Electronic Signature(s) Signed: 07/04/2021 12:16:10 PM By: Donnamarie Poag Entered ByDonnamarie Poag on 07/04/2021 08:21:17

## 2021-07-06 ENCOUNTER — Other Ambulatory Visit: Payer: Self-pay

## 2021-07-06 DIAGNOSIS — I872 Venous insufficiency (chronic) (peripheral): Secondary | ICD-10-CM | POA: Diagnosis not present

## 2021-07-06 NOTE — Progress Notes (Signed)
DEONTRE, ALLSUP ([id number]) Visit Report for 07/06/2021 Arrival Information Details Patient Name: [name], [name] Date of Service: 07/06/2021 8:00 AM Medical Record Number: [id number] Patient Account Number: [id number] Date of Birth/Sex: [birth year] (41 y.o. M) Treating RN: Donnamarie Poag Primary Care Lyell Clugston: [name] Other Clinician: Referring Zillah Alexie: [name] Treating Gayla Benn/Extender: Tito Dine in Treatment: 22 Visit Information History Since Last Visit Added or deleted any medications: No Patient Arrived: Ambulatory Had a fall or experienced change in No Arrival Time: 08:03 activities of daily living that may affect Accompanied By: self risk of falls: Transfer Assistance: None Hospitalized since last visit: No Patient Identification Verified: Yes Has Dressing in Place as Prescribed: Yes Secondary Verification Process Completed: Yes Has Compression in Place as Prescribed: Yes Patient Requires Transmission-Based No Pain Present Now: No Precautions: Patient Has Alerts: Yes Patient Alerts: Patient has reaction to silver dressings. Electronic Signature(s) Signed: 07/06/2021 3:50:58 PM By: Donnamarie Poag Entered By: Donnamarie Poag on 07/06/2021 08:21:34 [name] ([id number]) -------------------------------------------------------------------------------- Compression Therapy Details Patient Name: [name] Date of Service: 07/06/2021 8:00 AM Medical Record Number: [id number] Patient Account Number: [id number] Date of Birth/Sex: 10-Dec-1980 (40 y.o. M) Treating RN: Donnamarie Poag Primary Care Tynasia Mccaul: [name] Other Clinician: Referring Shankar Silber: [name] Treating Jenafer Winterton/Extender: Tito Dine in Treatment: 239 Compression Therapy Performed for Wound Assessment: Wound #1 Left,Lateral Lower Leg Performed By: Junius Argyle, RN Compression Type: Four Layer Electronic Signature(s) Signed: 07/06/2021 3:50:58 PM By:  Donnamarie Poag Entered By: Donnamarie Poag on 07/06/2021 08:22:09 [name] ([id number]) -------------------------------------------------------------------------------- Encounter Discharge Information Details Patient Name: [name] Date of Service: 07/06/2021 8:00 AM Medical Record Number: [id number] Patient Account Number: [id number] Date of Birth/Sex: 04-13-1981 (40 y.o. M) Treating RN: Donnamarie Poag Primary Care Jameel Quant: [name] Other Clinician: Referring Orlando Devereux: [name] Treating Alayne Estrella/Extender: Tito Dine in Treatment: 68 Encounter Discharge Information Items Discharge Condition: Stable Ambulatory Status: Ambulatory Discharge Destination: Home Transportation: Private Auto Accompanied By: self Schedule Follow-up Appointment: Yes Clinical Summary of Care: Electronic Signature(s) Signed: 07/06/2021 3:50:58 PM By: Donnamarie Poag Entered By: Donnamarie Poag on 07/06/2021 08:23:06 [name] ([id number]) -------------------------------------------------------------------------------- Wound Assessment Details Patient Name: [name] Date of Service: 07/06/2021 8:00 AM Medical Record Number: [id number] Patient Account Number: [id number] Date of Birth/Sex: 10-21-1980 (40 y.o. M) Treating RN: Donnamarie Poag Primary Care Argie Lober: [name] Other Clinician: Referring Erasmus Bistline: [name] Treating Dontaye Hur/Extender: Tito Dine in Treatment: 239 Wound Status Wound Number: 1 Primary Etiology: Pyoderma Wound Location: Left, Lateral Lower Leg Wound Status: Open Wounding Event: Gradually Appeared Comorbid History: Sleep Apnea, Hypertension, Colitis Date Acquired: 11/18/2015 Weeks Of Treatment: 239 Clustered Wound: No Wound Measurements Length: (cm) 2.5 Width: (cm) 1.4 Depth: (cm) 0.1 Area: (cm) 2.749 Volume: (cm) 0.275 % Reduction in Area: 44% % Reduction in Volume: 93% Epithelialization: Medium (34-66%) Wound  Description Classification: Full Thickness With Exposed Support Structures Wound Margin: Flat and Intact Exudate Amount: Medium Exudate Type: Serosanguineous Exudate Color: red, brown Foul Odor After Cleansing: No Slough/Fibrino Yes Wound Bed Granulation Amount: Large (67-100%) Exposed Structure Granulation Quality: Red, Pink Fascia Exposed: No Necrotic Amount: Small (1-33%) Fat Layer (Subcutaneous Tissue) Exposed: Yes Necrotic Quality: Adherent Slough Tendon Exposed: No Muscle Exposed: No Joint Exposed: No Bone Exposed: No Treatment Notes Wound #1 (Lower Leg) Wound Laterality: Left, Lateral Cleanser Wound Cleanser Discharge Instruction: Wash your hands with soap and water. Remove old dressing, discard into plastic bag and place into trash. Cleanse the wound with Wound Cleanser prior to applying a clean  dressing using gauze sponges, not tissues or cotton balls. Do not scrub or use excessive force. Pat dry using gauze sponges, not tissue or cotton balls. Peri-Wound Care Moisturizing Lotion Discharge Instruction: Suggestions: Theraderm, Eucerin, Cetaphil, or patient preference. Topical Clobetasol Propionate ointment 0.05%, 60 (g) tube Discharge Instruction: Pt to bring to appt- Triamcinolone Acetonide Cream, 0.1%, 15 (g) tube Discharge Instruction: Apply as directed by Clovis Mankins. To leg not on wound Primary Dressing Hydrofera Blue Ready Transfer Foam, 4x5 (in/in) Discharge Instruction: Apply Hydrofera Blue Ready to wound bed as directed OCTAVIA, MOTTOLA ([id number]) Secondary Dressing Zetuvit Plus Silicone Non-bordered 5x5 (in/in) Secured With Compression Wrap Medichoice 4 layer Compression System, 35-40 mmHG Discharge Instruction: Apply multi-layer wrap as directed. Compression Stockings Add-Ons Electronic Signature(s) Signed: 07/06/2021 3:50:58 PM By: Donnamarie Poag Entered ByDonnamarie Poag on 07/06/2021 08:21:54

## 2021-07-06 NOTE — Progress Notes (Signed)
GERREN, HOFFMEIER ([id number]) Visit Report for 07/06/2021 Physician Orders Details Patient Name: [name], [name] Date of Service: 07/06/2021 8:00 AM Medical Record Number: [id number] Patient Account Number: [id number] Date of Birth/Sex: [birth year] (41 y.o. M) Treating RN: Hansel Feinstein Primary Care Provider: [name] Other Clinician: Referring Provider: [name] Treating Provider/Extender: Altamese [place] Mandry in Treatment: 4 Verbal / Phone Orders: No Diagnosis Coding Follow-up Appointments o Return Appointment in 1 week. o Nurse Visit as needed - twice a week Bathing/ Shower/ Hygiene o Clean wound with Normal Saline or wound cleanser. o May shower with wound dressing protected with water repellent cover or cast protector. Edema Control - Lymphedema / Segmental Compressive Device / Other o Optional: One layer of unna paste to top of compression wrap (to act as an anchor). o Elevate, Exercise Daily and Avoid Standing for Long Periods of Time. o Elevate legs to the level of the heart and pump ankles as often as possible o Elevate leg(s) parallel to the floor when sitting. Wound Treatment Wound #1 - Lower Leg Wound Laterality: Left, Lateral Cleanser: Wound Cleanser 3 x Per Week/30 Days Discharge Instructions: Wash your hands with soap and water. Remove old dressing, discard into plastic bag and place into trash. Cleanse the wound with Wound Cleanser prior to applying a clean dressing using gauze sponges, not tissues or cotton balls. Do not scrub or use excessive force. Pat dry using gauze sponges, not tissue or cotton balls. Peri-Wound Care: Moisturizing Lotion 3 x Per Week/30 Days Discharge Instructions: Suggestions: Theraderm, Eucerin, Cetaphil, or patient preference. Topical: Clobetasol Propionate ointment 0.05%, 60 (g) tube 3 x Per Week/30 Days Discharge Instructions: Pt to bring to appt- Topical: Triamcinolone Acetonide Cream, 0.1%, 15 (g) tube 3 x Per  Week/30 Days Discharge Instructions: Apply as directed by provider. To leg not on wound Primary Dressing: Hydrofera Blue Ready Transfer Foam, 4x5 (in/in) 3 x Per Week/30 Days Discharge Instructions: Apply Hydrofera Blue Ready to wound bed as directed Secondary Dressing: Zetuvit Plus Silicone Non-bordered 5x5 (in/in) 3 x Per Week/30 Days Compression Wrap: Medichoice 4 layer Compression System, 35-40 mmHG (Generic) 3 x Per Week/30 Days Discharge Instructions: Apply multi-layer wrap as directed. Electronic Signature(s) Signed: 07/06/2021 3:30:14 PM By: Baltazar Najjar MD Signed: 07/06/2021 3:50:58 PM By: Hansel Feinstein Entered By: Hansel Feinstein on 07/06/2021 08:22:36 [name], [name] ([id number]) -------------------------------------------------------------------------------- SuperBill Details Patient Name: [name] Date of Service: 07/06/2021 Medical Record Number: [id number] Patient Account Number: [id number] Date of Birth/Sex: 1980/10/20 (41 y.o. 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

## 2021-07-08 ENCOUNTER — Other Ambulatory Visit: Payer: Self-pay | Admitting: Primary Care

## 2021-07-08 DIAGNOSIS — E119 Type 2 diabetes mellitus without complications: Secondary | ICD-10-CM

## 2021-07-09 ENCOUNTER — Encounter: Payer: 59 | Admitting: Physician Assistant

## 2021-07-09 ENCOUNTER — Other Ambulatory Visit: Payer: Self-pay

## 2021-07-09 DIAGNOSIS — I872 Venous insufficiency (chronic) (peripheral): Secondary | ICD-10-CM | POA: Diagnosis not present

## 2021-07-09 NOTE — Progress Notes (Signed)
RATHANA, VIVEROS ([id number]) Visit Report for 07/09/2021 Arrival Information Details Patient Name: [name], [name] Date of Service: 07/09/2021 8:15 AM Medical Record Number: [id number] Patient Account Number: [id number] Date of Birth/Sex: 1980-08-20 (41 y.o. M) Treating RN: Levora Dredge Primary Care Kanae Ignatowski: [name] Other Clinician: Referring Suleima Ohlendorf: [name] Treating Teigen Bellin/Extender: [name] Cliche in Treatment: 30 Visit Information History Since Last Visit Added or deleted any medications: No Patient Arrived: Ambulatory Any new allergies or adverse reactions: No Arrival Time: 08:14 Had a fall or experienced change in No Accompanied By: self activities of daily living that may affect Transfer Assistance: None risk of falls: Patient Identification Verified: Yes Hospitalized since last visit: No Secondary Verification Process Completed: Yes Has Dressing in Place as Prescribed: Yes Patient Requires Transmission-Based No Has Compression in Place as Prescribed: Yes Precautions: Pain Present Now: No Patient Has Alerts: Yes Patient Alerts: Patient has reaction to silver dressings. Electronic Signature(s) Signed: 07/09/2021 4:31:00 PM By: Levora Dredge Entered By: Levora Dredge on 07/09/2021 08:16:30 [name], [name] ([id number]) -------------------------------------------------------------------------------- Clinic Level of Care Assessment Details Patient Name: [name], [name] Date of Service: 07/09/2021 8:15 AM Medical Record Number: [id number] Patient Account Number: [id number] Date of Birth/Sex: August 12, 1980 (41 y.o. M) Treating RN: Levora Dredge Primary Care Trystin Terhune: [name] Other Clinician: Referring Sophy Mesler: [name] Treating Nitasha Jewel/Extender: [name] Cliche in Treatment: 27 Clinic Level of Care Assessment Items TOOL 1 Quantity Score _0  - Use when EandM and Procedure is performed on INITIAL visit 0 ASSESSMENTS - Nursing  Assessment / Reassessment _1  - General Physical Exam (combine w/ comprehensive assessment (listed just below) when performed on new 0 pt. evals) _2  - 0 Comprehensive Assessment (HX, ROS, Risk Assessments, Wounds Hx, etc.) ASSESSMENTS - Wound and Skin Assessment / Reassessment _3  - Dermatologic / Skin Assessment (not related to wound area) 0 ASSESSMENTS - Ostomy and/or Continence Assessment and Care _4  - Incontinence Assessment and Management 0 _5  - 0 Ostomy Care Assessment and Management (repouching, etc.) PROCESS - Coordination of Care _6  - Simple Patient / Family Education for ongoing care 0 _7  - 0 Complex (extensive) Patient / Family Education for ongoing care _8  - 0 Staff obtains Programmer, systems, Records, Test Results / Process Orders _9  - 0 Staff telephones HHA, Nursing Homes / Clarify orders / etc _10  - 0 Routine Transfer to another Facility (non-emergent condition) _11  - 0 Routine Hospital Admission (non-emergent condition) _12  - 0 New Admissions / Biomedical engineer / Ordering NPWT, Apligraf, etc. _13  - 0 Emergency Hospital Admission (emergent condition) PROCESS - Special Needs _14  - Pediatric / Minor Patient Management 0 _15  - 0 Isolation Patient Management _16  - 0 Hearing / Language / Visual special needs _17  - 0 Assessment of Community assistance (transportation, D/C planning, etc.) _18  - 0 Additional assistance / Altered mentation _19  - 0 Support Surface(s) Assessment (bed, cushion, seat, etc.) INTERVENTIONS - Miscellaneous _20  - External ear exam 0 _21  - 0 Patient Transfer (multiple staff / Civil Service fast streamer / Similar devices) _22  - 0 Simple Staple / Suture removal (25 or less) _23  - 0 Complex Staple / Suture removal (26 or more) _24  - 0 Hypo/Hyperglycemic Management (do not check if billed separately) _25  - 0 Ankle / Brachial Index (ABI) - do not check if billed separately Has the patient been seen at the hospital within the last three years: Yes Total Score: 0 Level Of  Care: ____ [name] ([id number]) Electronic Signature(s) Signed: 07/09/2021 4:31:00 PM By: Levora Dredge Entered By: Levora Dredge on 07/09/2021 08:52:40 [name], [name] ([id number]) --------------------------------------------------------------------------------  Compression Therapy Details Patient Name: [name], [name] Date of Service: 07/09/2021 8:15 AM Medical Record Number: [id number] Patient Account Number: [id number] Date of Birth/Sex: [birth year] (41 y.o. M) Treating RN: Levora Dredge Primary Care Yasiel Goyne: [name] Other Clinician: Referring Caitland Porchia: [name] Treating Navjot Loera/Extender: [name] Cliche in Treatment: 239 Compression Therapy Performed for Wound Assessment: Wound #1 Left,Lateral Lower Leg Performed By: Clinician Levora Dredge, RN Compression Type: Four Layer Pre Treatment ABI: 1.2 Post Procedure Diagnosis Same as Pre-procedure Electronic Signature(s) Signed: 07/09/2021 4:31:00 PM By: Levora Dredge Entered By: Levora Dredge on 07/09/2021 08:52:18 [name], [name] ([id number]) -------------------------------------------------------------------------------- Encounter Discharge Information Details Patient Name: [name] Date of Service: 07/09/2021 8:15 AM Medical Record Number: [id number] Patient Account Number: [id number] Date of Birth/Sex: 1981-01-30 (40 y.o. M) Treating RN: Levora Dredge Primary Care Granger Chui: [name] Other Clinician: Referring Gilda Abboud: [name] Treating Kasin Tonkinson/Extender: [name] Cliche in Treatment: 57 Encounter Discharge Information Items Discharge Condition: Stable Ambulatory Status: Ambulatory Discharge Destination: Home Transportation: Private Auto Accompanied By: self Schedule Follow-up Appointment: Yes Clinical Summary of Care: Electronic Signature(s) Signed: 07/09/2021 4:31:00 PM By: Levora Dredge Entered By: Levora Dredge on 07/09/2021 08:53:50 [name]  ([id number]) -------------------------------------------------------------------------------- Lower Extremity Assessment Details Patient Name: [name] Date of Service: 07/09/2021 8:15 AM Medical Record Number: [id number] Patient Account Number: [id number] Date of Birth/Sex: September 17, 1980 (40 y.o. M) Treating RN: Levora Dredge Primary Care Rainelle Sulewski: [name] Other Clinician: Referring Calissa Swenor: [name] Treating Skya Mccullum/Extender: [name] Cliche in Treatment: 239 Edema Assessment Assessed: [Left: No] [Right: No] [Left: Edema] [Right: :] Calf Left: Right: Point of Measurement: 35 cm From Medial Instep 47.9 cm Ankle Left: Right: Point of Measurement: 10 cm From Medial Instep 29.2 cm Electronic Signature(s) Signed: 07/09/2021 4:31:00 PM By: Levora Dredge Entered By: Levora Dredge on 07/09/2021 08:27:32 [name], [name] ([id number]) -------------------------------------------------------------------------------- Multi Wound Chart Details Patient Name: [name] Date of Service: 07/09/2021 8:15 AM Medical Record Number: [id number] Patient Account Number: [id number] Date of Birth/Sex: 1981-03-09 (40 y.o. M) Treating RN: Levora Dredge Primary Care Nelton Amsden: [name] Other Clinician: Referring Reata Petrov: [name] Treating Giovanni Bath/Extender: [name] Cliche in Treatment: 239 Vital Signs Height(in): 71 Pulse(bpm): 80 Weight(lbs): 338 Blood Pressure(mmHg): 135/85 Body Mass Index(BMI): 47.1 Temperature(F): 97.8 Respiratory Rate(breaths/min): 18 Photos: [N/A:N/A] Wound Location: Left, Lateral Lower Leg N/A N/A Wounding Event: Gradually Appeared N/A N/A Primary Etiology: Pyoderma N/A N/A Comorbid History: Sleep Apnea, Hypertension, Colitis N/A N/A Date Acquired: 11/18/2015 N/A N/A Weeks of Treatment: 239 N/A N/A Wound Status: Open N/A N/A Wound Recurrence: No N/A N/A Measurements L x W x D (cm) 2.2x1.5x0.1 N/A N/A Area (cm) : 2.592 N/A  N/A Volume (cm) : 0.259 N/A N/A % Reduction in Area: 47.20% N/A N/A % Reduction in Volume: 93.40% N/A N/A Classification: Full Thickness With Exposed N/A N/A Support Structures Exudate Amount: Medium N/A N/A Exudate Type: Serosanguineous N/A N/A Exudate Color: red, brown N/A N/A Wound Margin: Flat and Intact N/A N/A Granulation Amount: Large (67-100%) N/A N/A Granulation Quality: Red, Pink N/A N/A Necrotic Amount: Small (1-33%) N/A N/A Exposed Structures: Fat Layer (Subcutaneous Tissue): N/A N/A Yes Fascia: No Tendon: No Muscle: No Joint: No Bone: No Epithelialization: Medium (34-66%) N/A N/A Treatment Notes Electronic Signature(s) Signed: 07/09/2021 4:31:00 PM By: Levora Dredge Entered By: Levora Dredge on 07/09/2021 08:33:55 [name] ([id number]) -------------------------------------------------------------------------------- Multi-Disciplinary Care Plan Details Patient Name: [name] Date of Service: 07/09/2021 8:15 AM Medical Record Number: [id number] Patient Account Number: [id number] Date of Birth/Sex: June 21, 1980 (40 y.o. M) Treating RN: Levora Dredge Primary  Care Gladies Sofranko: [name] Other Clinician: Referring Chapin Arduini: [name] Treating Antoria Lanza/Extender: [name] Cliche in Treatment: [place] reviewed with physician Active Inactive Necrotic Tissue Nursing Diagnoses: Impaired tissue integrity related to necrotic/devitalized tissue Knowledge deficit related to management of necrotic/devitalized tissue Goals: Necrotic/devitalized tissue will be minimized in the wound bed Date Initiated: 03/26/2021 Target Resolution Date: 05/26/2021 Goal Status: Active Patient/caregiver will verbalize understanding of reason and process for debridement of necrotic tissue Date Initiated: 03/26/2021 Date Inactivated: 04/27/2021 Target Resolution Date: 03/26/2021 Goal Status: Met Interventions: Assess patient pain level pre-,  during and post procedure and prior to discharge Provide education on necrotic tissue and debridement process Treatment Activities: Apply topical anesthetic as ordered : 03/26/2021 Notes: Electronic Signature(s) Signed: 07/09/2021 4:31:00 PM By: Levora Dredge Entered By: Levora Dredge on 07/09/2021 08:33:42 [name], [name] ([id number]) -------------------------------------------------------------------------------- Pain Assessment Details Patient Name: [name] Date of Service: 07/09/2021 8:15 AM Medical Record Number: [id number] Patient Account Number: [id number] Date of Birth/Sex: March 20, 1981 (40 y.o. M) Treating RN: Levora Dredge Primary Care Coulter Oldaker: [name] Other Clinician: Referring Vonita Calloway: [name] Treating Omara Alcon/Extender: [name] Cliche in Treatment: 239 Active Problems Location of Pain Severity and Description of Pain Patient Has Paino No Site Locations Rate the pain. Current Pain Level: 0 Pain Management and Medication Current Pain Management: Electronic Signature(s) Signed: 07/09/2021 4:31:00 PM By: Levora Dredge Entered By: Levora Dredge on 07/09/2021 08:18:26 [name], [name] ([id number]) -------------------------------------------------------------------------------- Patient/Caregiver Education Details Patient Name: [name] Date of Service: 07/09/2021 8:15 AM Medical Record Number: [id number] Patient Account Number: [id number] Date of Birth/Gender: 1981/02/03 (40 y.o. M) Treating RN: Levora Dredge Primary Care Physician: [name] Other Clinician: Referring Physician: [name] Treating Physician/Extender: [name] Cliche in Treatment: 239 Education Assessment Education Provided To: Patient Education Topics Provided Wound/Skin Impairment: Handouts: Caring for Your Ulcer Methods: Explain/Verbal Responses: State content correctly Electronic Signature(s) Signed: 07/09/2021 4:31:00 PM By: Levora Dredge Entered By: Levora Dredge on 07/09/2021 08:53:10 [name], [name] ([id number]) -------------------------------------------------------------------------------- Wound Assessment Details Patient Name: [name] Date of Service: 07/09/2021 8:15 AM Medical Record Number: [id number] Patient Account Number: [id number] Date of Birth/Sex: 1980-09-18 (40 y.o. M) Treating RN: Levora Dredge Primary Care Tadao Emig: [name] Other Clinician: Referring Vivion Romano: [name] Treating Yanelli Zapanta/Extender: [name] Cliche in Treatment: 239 Wound Status Wound Number: 1 Primary Etiology: Pyoderma Wound Location: Left, Lateral Lower Leg Wound Status: Open Wounding Event: Gradually Appeared Comorbid History: Sleep Apnea, Hypertension, Colitis Date Acquired: 11/18/2015 Weeks Of Treatment: 239 Clustered Wound: No Photos Wound Measurements Length: (cm) 2.2 Width: (cm) 1.5 Depth: (cm) 0.1 Area: (cm) 2.592 Volume: (cm) 0.259 % Reduction in Area: 47.2% % Reduction in Volume: 93.4% Epithelialization: Medium (34-66%) Tunneling: No Undermining: No Wound Description Classification: Full Thickness With Exposed Support Structures Wound Margin: Flat and Intact Exudate Amount: Medium Exudate Type: Serosanguineous Exudate Color: red, brown Foul Odor After Cleansing: No Slough/Fibrino Yes Wound Bed Granulation Amount: Large (67-100%) Exposed Structure Granulation Quality: Red, Pink Fascia Exposed: No Necrotic Amount: Small (1-33%) Fat Layer (Subcutaneous Tissue) Exposed: Yes Necrotic Quality: Adherent Slough Tendon Exposed: No Muscle Exposed: No Joint Exposed: No Bone Exposed: No Treatment Notes Wound #1 (Lower Leg) Wound Laterality: Left, Lateral Cleanser Wound Cleanser Discharge Instruction: Wash your hands with soap and water. Remove old dressing, discard into plastic bag and place into trash. Cleanse the wound with Wound Cleanser prior to applying a clean dressing using  gauze sponges, not tissues or cotton balls. Do not scrub or use excessive force. Pat dry using gauze  sponges, not tissue or cotton balls. [name], [name] ([id number]) [place] Lotion Discharge Instruction: Suggestions: Theraderm, Eucerin, Cetaphil, or patient preference. Topical Clobetasol Propionate ointment 0.05%, 60 (g) tube Discharge Instruction: Pt to bring to appt- Triamcinolone Acetonide Cream, 0.1%, 15 (g) tube Discharge Instruction: Apply as directed by Kaylor Simenson. To leg not on wound Primary Dressing Hydrofera Blue Ready Transfer Foam, 4x5 (in/in) Discharge Instruction: Apply Hydrofera Blue Ready to wound bed as directed Secondary Dressing Zetuvit Plus Silicone Non-bordered 5x5 (in/in) Secured With Compression Wrap Medichoice 4 layer Compression System, 35-40 mmHG Discharge Instruction: Apply multi-layer wrap as directed. Compression Stockings Add-Ons Electronic Signature(s) Signed: 07/09/2021 4:31:00 PM By: Levora Dredge Entered By: Levora Dredge on 07/09/2021 08:26:56 [name], [name] ([id number]) -------------------------------------------------------------------------------- Vitals Details Patient Name: [name] Date of Service: 07/09/2021 8:15 AM Medical Record Number: [id number] Patient Account Number: [id number] Date of Birth/Sex: 1980/09/15 (40 y.o. M) Treating RN: Levora Dredge Primary Care Omolara Carol: [name] Other Clinician: Referring Kelena Garrow: [name] Treating Terrian Sentell/Extender: [name] Cliche in Treatment: 239 Vital Signs Time Taken: 08:16 Temperature (F): 97.8 Height (in): 71 Pulse (bpm): 80 Weight (lbs): 338 Respiratory Rate (breaths/min): 18 Body Mass Index (BMI): 47.1 Blood Pressure (mmHg): 135/85 Reference Range: 80 - 120 mg / dl Electronic Signature(s) Signed: 07/09/2021 4:31:00 PM By: Levora Dredge Entered By: Levora Dredge on 07/09/2021 08:17:58

## 2021-07-09 NOTE — Progress Notes (Signed)
ICKER, [name] ([id number]) Visit Report for 07/09/2021 Chief Complaint Document Details Patient Name: [name], [name] Date of Service: 07/09/2021 8:15 AM Medical Record Number: [id number] Patient Account Number: [id number] Date of Birth/Sex: 09-17-1980 (41 y.o. 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 M) Treating RN: Levora Dredge Primary Care Provider: [name] Other Clinician: Referring Provider: [name] Treating Provider/Extender: [name] Cliche in Treatment: 74 History of Present Illness HPI Description: 12/04/16; 41 year old man who comes into the clinic today for review of a wound on the posterior left calf. He tells me that is been there for about a year. He is not a diabetic he does smoke half a pack per day. He was seen in the ER on 11/20/16 felt to have cellulitis around the wound and was given clindamycin. An x-ray did not show osteomyelitis. The patient initially tells me that he has a milk allergy that sets off a pruritic itching rash on his lower legs which she scratches incessantly and he thinks that's what may have set up the wound. He has been using various topical antibiotics and ointments without any effect. He works in a [employer] and is on his feet all day. He does not have a  prior history of wounds however he does have the rash on both lower legs the right arm and the ventral aspect of his left arm. These are excoriations and clearly have had scratching however there are of macular looking areas on both legs including a substantial larger area on the right leg. This does not have an underlying open area. There is no blistering. The patient tells me that 2 years ago in [place] in response to the rash on his legs he saw a dermatologist who told him he had a condition which may be pyoderma gangrenosum although I may be putting words into his mouth. He seemed to recognize this. On further questioning he admits to a 5 year history of quiesced. ulcerative colitis. He is not in any treatment for this. He's had no recent travel 12/11/16; the patient arrives today with his wound and roughly the same condition we've been using silver alginate this is a deep punched out wound with some surrounding erythema but no tenderness. Biopsy I did did not show confirmed pyoderma gangrenosum suggested nonspecific inflammation and vasculitis but does not provide an actual description of what was seen by the pathologist. I'm really not able to understand this We have also received information from the patient's dermatologist in [place] notes from April 2016. This was a doctor Agarwal-antal. 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 He was prescribed topical steroid high potency under occlusion which helped but at this point the patient did not have a deep punched out wound. 12/18/16; the patient's wound is larger in terms of surface area however this surface looks better and there is less depth. The surrounding erythema also is better. The patient states that the wrap we put on came off 2 days ago when he has been using his compression stockings.  He we are in the process of getting a dermatology consult. 12/26/16 on evaluation today patient's left lower extremity wound shows evidence of  infection with surrounding erythema noted. He has been tolerating the dressing changes but states that he has noted more discomfort. There is a larger area of erythema surrounding the wound. No fevers, chills, nausea, or vomiting noted at this time. With that being said the wound still does have slough covering the surface. He is not allergic to any medication that he is aware of at this point. In regard to his right lower extremity he had several regions that are erythematous and pruritic he wonders if there's anything we can do to help that. 01/02/17 I reviewed patient's wound culture which was obtained his visit last week. He was placed on doxycycline at that point. Unfortunately that does not appear to be an antibiotic that would likely help with the situation however the pseudomonas noted on culture is sensitive to Cipro. Also unfortunately patient's wound seems to have a large compared to last week's evaluation. Not severely so but there are definitely increased measurements in general. He is continuing to have discomfort as well he writes this to be a seven out of 10. In fact he would prefer me not to perform any debridement today due to the fact that he is having discomfort and considering he has an active infection on the [name] reluctant to do so anyway. No fevers, chills, nausea, or vomiting noted at this time. 01/08/17; patient seems dermatology on September 5. I suspect dermatology will want the slides from the biopsy I did sent to their pathologist. I'm not sure if there is a way we can expedite that. In any case the culture I did before I left on vacation 3 weeks ago showed Pseudomonas he was given 10 days of Cipro and per her description of her intake nurses is actually somewhat better this week although the wound is quite a bit bigger than I remember the last time I saw this. He still has 3 more days of Cipro 01/21/17; dermatology appointment tomorrow. He has completed the ciprofloxacin  for Pseudomonas. Surface of the wound looks better however he is had some deterioration in the lesions on his right leg. Meantime the left lateral leg wound we will continue with sample 01/29/17; patient had his dermatology appointment but I can't yet see that note. He is completed his antibiotics. The wound is more superficial but considerably larger in circumferential area than when he came in. This is in his left lateral calf. He also has swollen erythematous areas with superficial wounds on the right leg and small papular areas on both arms. There apparently areas in her his upper thighs and buttocks I did not look at those. Dermatology biopsied the right leg. Hopefully will have their input next week. 02/05/17; patient went back to see his dermatologist who told him that he had a "scratching problem" as well as staph. He is now on a 30 day course of doxycycline and I believe she gave him triamcinolone cream to the right leg areas to help with the itching [not exactly sure but probably triamcinolone]. She apparently looked at the left lateral leg wound although this was not rebiopsied and I think felt to be ultimately part of the same pathogenesis. He is using sample border foam and changing nevus himself. He now has a new open area on the right posterior leg which was his biopsy site I don't have any of the dermatology notes  02/12/17; we put the patient in compression last week with SANTYL to the wound on the left leg and the biopsy. Edema is much better and the depth of the wound is now at level of skin. Area is still the same oBiopsy site on the right lateral leg we've also been using santyl with a border foam dressing and he is changing this himself. 02/19/17; Using silver alginate started last week to both the substantial left leg wound and the biopsy site on the right wound. He is tolerating compression well. Has a an appointment with his primary M.D. tomorrow wondering about diuretics although  I'm wondering if the edema problem is actually lymphedema 02/26/17; the patient has been to see his primary doctor Dr. Jerrel Ivory at [place] our primary care. She started him on Lasix 20 mg and this seems to have helped with the edema. However we are not making substantial change with the left lateral calf wound and inflammation. The biopsy site on the right leg also looks stable but not really all that different. 03/12/17; the patient has been to see vein and vascular Dr. Lucky Cowboy. He has had venous reflux studies I have not reviewed these. I did get a call from his dermatology office. They felt that he might have pathergy based on their biopsy on his right leg which led them to look at the slides of [name], [name] ([id number]) the biopsy I did on the left leg and they wonder whether this represents pyoderma gangrenosum which was the original supposition in a man with ulcerative colitis albeit inactive for many years. They therefore recommended clobetasol and tetracycline i.e. aggressive treatment for possible pyoderma gangrenosum. 03/26/17; apparently the patient just had reflux studies not an appointment with Dr. dew. She arrives in clinic today having applied clobetasol for 2-3 weeks. He notes over the last 2-3 days excessive drainage having to change the dressing 3-4 times a day and also expanding erythema. 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 Lillia Carmel at [facility] dermatology locally who kindly saw him at my request. 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 My concern was the degree of erythema medially extending from his left leg wound which was either inflammation from pyoderma  or cellulitis. I put him on Augmentin however culture of the wound showed Pseudomonas which is quinolone sensitive. I really don't believe he has cellulitis however in view of everything I will continue and give him a course of Cipro. He is also on doxycycline as an immune modulator for the pyoderma. In addition to his original wound on the left lateral leg with surrounding erythema he has a wound on the right posterior calf which was an original biopsy site done by dermatology. This was felt to represent pathergy from pyoderma gangrenosum 04/16/17; pyoderma gangrenosum. Saw Dr. Lillia Carmel yesterday. He has been using topical antibiotics to both wound areas his original wound on the left and the biopsies/pathergy area on the right. There is definitely some improvement in the inflammation around the wound on the right although the patient states he has increasing sensitivity of the wounds. He is on prednisone 60 and doxycycline 1 as prescribed by Dr. Lillia Carmel. He is covering the topical antibiotic with gauze  and putting this in his own compression stocks and changing this daily. He states that Dr. Lottie Rater did a culture of the left leg wound yesterday 05/07/17; pyoderma gangrenosum. The patient saw Dr. Lillia Carmel yesterday and has a follow-up with her in one month. He is still using topical antibiotics to both wounds although he can't recall exactly what type. He is still on prednisone 60 mg. Dr. Lillia Carmel stated that the doxycycline could stop if we were in agreement. 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 The right medial leg was induced by biopsy/pathergy. The area on the right is essentially healed. Still on high-dose prednisone using topical antibiotics to the wound 07/09/17; pyoderma gangrenosum with wounds on the left lateral leg. The right medial leg has closed and remains closed. He  is still on prednisone 60. oHe tells me he missed his last dermatology appointment with Dr. Lillia Carmel but will make another appointment. He reports that her blood sugar at a recent screen in [place] was high 200's. He was 180 today. 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 I think he is going to require still much longer prednisone perhaps another 3 months before attempting to taper. In the meantime his wound is a lot better. Smaller. He is cleaning this off daily and applying topical antibiotics. When he was last in the clinic I thought about changing to [facility] and actually put in a couple of calls to dermatology although probably not during their business hours. 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 He continues on oral prednisone. He has been using triple antibiotic ointment. There is surface debris and we will transition to [facility] and have him return in 2 weeks. He has lost 30 pounds since his last appointment with lifestyle modification. He may benefit from topical steroid cream for treatment this can be considered at a later date. 08/22/17 on evaluation today patient appears to actually be doing rather well in regard to his left lateral lower extremity ulcer. He has actually been managed by Dr. Dellia Nims most recently. Patient is currently on oral steroids at this time. This seems to have been of benefit for him. Nonetheless his last visit was actually with Leah on 08/06/17. Currently he is not utilizing any topical steroid creams although this could be of benefit as well. No fevers, chills, nausea, or vomiting noted at this time. 09/05/17 on evaluation today patient appears to be doing better in regard to his left lateral lower extremity ulcer. He has been tolerating the dressing changes without complication. He is using Santyl with good effect. Overall I'm  very pleased with how things are standing at this point. Patient likewise is happy that this is doing better. 09/19/17 on evaluation today patient actually appears to be doing rather well in regard to his left lateral lower extremity ulcer. 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 He's not having any significant pain. 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 He has been tolerating the Santyl without complication and in general I feel like he's making good progress. 10/17/17 on evaluation today patient  appears to be doing very well in regard to his left lateral lower surety ulcer. He has been tolerating the dressing changes without complication. There does not appear to be any evidence of infection he's alternating the Santyl and the triple antibiotic ointment every other day this seems to be doing well for him. 11/03/17 on evaluation today patient appears to be doing very well in regard to his left lateral lower extremity ulcer. He is been tolerating the dressing changes without complication which is good news. Fortunately there does not appear to be any evidence of infection which is also great news. Overall is doing excellent they are starting to taper down on the prednisone is down to 40 mg at this point it also started topical clobetasol for him. 11/17/17 on evaluation today patient appears to be doing well in regard to his left lateral lower surety ulcer. He's been tolerating the dressing changes without complication. He does note that he is having no pain, no excessive drainage or discharge, and overall he feels like things are going about how he would expect and hope they would. Overall he seems to have no evidence of infection at this time in my opinion which is good news. 12/04/17-He is seen in follow-up  evaluation for right lateral lower extremity ulcer. He has been applying topical steroid cream. Today's measurement show slight increase in size. Over the next 2 weeks we will transition to every other day Santyl and steroid cream. He has been encouraged to monitor for changes and notify clinic with any concerns 12/15/17 on evaluation today patient's left lateral motion the ulcer and fortunately is doing worse again at this point. This just since last week to this week has close to doubled in size according to the patient. I did not seeing last week's I do not have a visual to compare this to in our system was also down so we do not have all the charts and at this point. Nonetheless it does have me somewhat concerned in regard to the fact that again he was worried enough about it he has contact the dermatology that placed them back on the full strength, 50 mg a day of the prednisone that he was taken previous. He continues to alternate using clobetasol along with Santyl at this point. He is obviously somewhat frustrated. 12/22/17 on evaluation today patient appears to be doing a [name] worse compared to last evaluation. Unfortunately the wound is a [name] deeper and slightly larger than the last week's evaluation. With that being said he has made some progress in regard to the irritation surrounding at this time unfortunately despite that progress that's been made he still has a significant issue going on here. 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 [name], [name] ([id number]) For that reason I discussed with him today the possibility of perform a wound culture to ensure there's nothing overtly infected. 01/06/18 on evaluation today patient's wound is larger and deeper than previously evaluated. With that being said it did appear that his wound was  infected after my last evaluation with him. Subsequently I did end up prescribing a prescription for Bactrim DS which she has been taking and having no complication with. Fortunately there does not appear to be any evidence of infection at this point in time as far as anything spreading, no want to touch, and overall I feel like  things are showing signs of improvement. 01/13/18 on evaluation today patient appears to be even a [name] larger and deeper than last time. There still muscle exposed in the base of the wound. Nonetheless he does appear to be less erythematous I do believe inflammation is calming down also believe the infection looks like it's probably resolved at this time based on what I'm seeing. No fevers, chills, nausea, or vomiting noted at this time. 01/30/18 on evaluation today patient actually appears to visually look better for the most part. Unfortunately those visually this looks better he does seem to potentially have what may be an abscess in the muscle that has been noted in the central portion of the wound. This is the first time that I have noted what appears to be fluctuance in the central portion of the muscle. With that being said I'm somewhat more concerned about the fact that this might indicate an abscess formation at this location. I do believe that an ultrasound would be appropriate. This is likely something we need to try to do as soon as possible. He has been switch to mupirocin ointment and he is no longer using the steroid ointment as prescribed by dermatology he sees them again next week he's been decreased from 60 to 40 mg of prednisone. 03/09/18 on evaluation today patient actually appears to be doing a [name] better compared to last time I saw him. There's not as much erythema surrounding the wound itself. He I did review his most recent infectious disease note which was dated 02/24/18. He saw Dr. Michel Bickers in [place]. 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 Patient is now off of antibiotics and they are continually observing this. There seems to be no change in the past two weeks in my pinion based on what the patient says and what I see today compared to what Dr. Megan Salon likely saw two weeks ago. No fevers, chills, nausea, or vomiting noted at this time. 03/23/18 on evaluation today patient's wound actually appears to be showing signs of improvement which is good news. He is currently still on the Dapsone. He is also working on tapering the prednisone to get off of this and Dr. Lottie Rater is working with him in this regard. Nonetheless overall I feel like the wound is doing well it does appear based on the infectious disease note that I reviewed from Dr. 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 I did also review the results of his ultrasound of left lower extremity which revealed there was a dentist tissue in the base of the wound without an abscess noted. 04/06/18 on evaluation today the patient's left lateral lower extremity ulcer actually appears to be doing fairly well which is excellent news. There does not appear to be any evidence of infection at this time which is also great news. Overall he still does have a significantly large ulceration although [name] by [name] he seems to be making progress. He is down to 10 mg a day of the prednisone. 04/20/18 on evaluation today patient actually appears to be doing excellent at this time in regard to his left lower extremity ulcer. He's making signs of good progress unfortunately this is taking much longer than we would really like to see but nonetheless he is  making progress. Fortunately there does not appear to be any evidence of infection at this time. No fevers, chills, nausea, or vomiting noted at  this time. The patient has not been using the Santyl due to the cost he hadn't got in this field yet. He's mainly been using the antibiotic ointment topically. Subsequently he also tells me that he really has not been scrubbing in the shower I think this would be helpful again as I told him it doesn't have to be anything too aggressive to even make it believe just enough to keep it free of some of the loose slough and biofilm on the wound surface. 05/11/18 on evaluation today patient's wound appears to be making slow but sure progress in regard to the left lateral lower extremity ulcer. He is been tolerating the dressing changes without complication. Fortunately there does not appear to be any evidence of infection at this time. He is still just using triple antibiotic ointment along with clobetasol occasionally over the area. He never got the Santyl and really does not seem to intend to in my pinion. 06/01/18 on evaluation today patient appears to be doing a [name] better in regard to his left lateral lower extremity ulcer. He states that overall he does not feel like he is doing as well with the Dapsone as he did with the prednisone. Nonetheless he sees his dermatologist later today and is gonna talk to them about the possibility of going back on the prednisone. Overall again I believe that the wound would be better if you would utilize Santyl but he really does not seem to be interested in going back to the [place] at this point. He has been using triple antibiotic ointment. 06/15/18 on evaluation today patient's wound actually appears to be doing about the same at this point. Fortunately there is no signs of infection at this time. He has made slight improvements although he continues to not really want to clean the wound bed at this point. He states that he just doesn't mess with it he doesn't want to cause any problems with everything else he has going on. He has been on medication, antibiotics  as prescribed by his dermatologist, for a staff infection of his lower extremities which is really drying out now and looking much better he tells me. Fortunately there is no sign of overall infection. 06/29/18 on evaluation today patient appears to be doing well in regard to his left lateral lower surety ulcer all things considering. Fortunately his staff infection seems to be greatly improved compared to previous. He has no signs of infection and this is drying up quite nicely. He is still the doxycycline for this is no longer on cental, Dapsone, or any of the other medications. His dermatologist has recommended possibility of an infusion but right now he does not want to proceed with that. 07/13/18 on evaluation today patient appears to be doing about the same in regard to his left lateral lower surety ulcer. Fortunately there's no signs of infection at this time which is great news. Unfortunately he still builds up a significant amount of Slough/biofilm of the surface of the wound he still is not really cleaning this as he should be appropriately. Again I'm able to easily with saline and gauze remove the majority of this on the surface which if you would do this at home would likely be a dramatic improvement for him as far as getting the area to improve. Nonetheless overall I still feel like he  is making progress is just very slow. I think Santyl will be of benefit for him as well. Still he has not gotten this as of this point. 07/27/18 on evaluation today patient actually appears to be doing [name] worse in regards of the erythema around the periwound region of the wound he also tells me that he's been having more drainage currently compared to what he was experiencing last time I saw him. He states not quite as bad as what he had because this was infected previously but nonetheless is still appears to be doing poorly. Fortunately there is no evidence of systemic infection at this point. The patient  tells me that he is not going to be able to afford the Santyl. He is still waiting to hear about the infusion therapy with his dermatologist. Apparently she wants an updated colonoscopy first. 08/10/18 on evaluation today patient appears to be doing better in regard to his left lateral lower extremity ulcer. Fortunately he is showing signs of improvement in this regard he's actually been approved for Remicade infusion's as well although this has not been scheduled as of yet. Fortunately there's no signs of active infection at this time in regard to the wound although he is having some issues with infection of the right lower extremity is been seen as dermatologist for this. Fortunately they are definitely still working with him trying to keep things under control. [name], [name] ([id number]) 09/07/18 on evaluation today patient is actually doing rather well in regard to his left lateral lower extremity ulcer. He notes these actually having some hair grow back on his extremity which is something he has not seen in years. He also tells me that the pain is really not giving them any trouble at this time which is also good news overall she is very pleased with the progress he's using a combination of the mupirocin along with the probate is all mixed. 09/21/18 on evaluation today patient actually appears to be doing fairly well all things considered in regard to his looks from the ulcer. He's been tolerating the dressing changes without complication. Fortunately there's no signs of active infection at this time which is good news he is still on all antibiotics or prevention of the staff infection. He has been on prednisone for time although he states it is gonna contact his dermatologist and see if she put them on a short course due to some irritation that he has going on currently. Fortunately there's no evidence of any overall worsening this is going very slow I think cental would be something that would be  helpful for him although he states that $50 for tube is quite expensive. He therefore is not willing to get that at this point. 10/06/18 on evaluation today patient actually appears to be doing decently well in regard to his left lateral leg ulcer. He's been tolerating the dressing changes without complication. Fortunately there's no signs of active infection at this time. Overall I'm actually rather pleased with the progress he's making although it's slow he doesn't show any signs of infection and he does seem to be making some improvement. I do believe that he may need a switch up and dressings to try to help this to heal more appropriately and quickly. 10/19/18 on evaluation today patient actually appears to be doing better in regard to his left lateral lower extremity ulcer. This is shown signs of having much less Slough buildup at this point due to the fact he has been using  the Santyl. Obviously this is very good news. The overall size of the wound is not dramatically smaller but again the appearance is. 11/02/18 on evaluation today patient actually appears to be doing quite well in regard to his lower [place] ulcer. A lot of the skin around the ulcer is actually somewhat irritating at this point this seems to be more due to the dressing causing irritation from the adhesive that anything else. Fortunately there is no signs of active infection at this time. 11/24/18 on evaluation today patient appears to be doing a [name] worse in regard to his overall appearance of his lower extremity ulcer. There's more erythema and warmth around the wound unfortunately. He is currently on doxycycline which he has been on for some time. With that being said I'm not sure that seems to be helping with what appears to possibly be an acute cellulitis with regard to his left lower extremity ulcer. No fevers, chills, nausea, or vomiting noted at this time. 12/08/18 on evaluation today patient's wounds actually appears to  be doing significantly better compared to his last evaluation. He has been using Santyl along with alternating tripling about appointment as well as the steroid cream seems to be doing quite well and the wound is showing signs of improvement which is excellent news. Fortunately there's no evidence of infection and in fact his culture came back negative with only normal skin flora noted. 12/21/2018 upon evaluation today patient actually appears to be doing excellent with regard to his ulcer. This is actually the best that I have seen it since have been helping to take care of him. It is both smaller as well as less slough noted on the surface of the wound and seems to be showing signs of good improvement with new skin growing from the edges. He has been using just the triamcinolone he does wonder if he can get a refill of that ointment today. 01/04/2019 upon evaluation today patient actually appears to be doing well with regard to his left lateral lower extremity ulcer. With that being said it does not appear to be that he is doing quite as well as last time as far as progression is concerned. There does not appear to be any signs of infection or significant irritation which is good news. With that being said I do believe that he may benefit from switching to a collagen based dressing based on how clean The wound appears. 01/18/2019 on evaluation today patient actually appears to be doing well with regard to his wound on the left lower extremity. He is not made a lot of progress compared to where we were previous but nonetheless does seem to be doing okay at this time which is good news. There is no signs of active infection which is also good news. My only concern currently is I do wish we can get him into utilizing the collagen dressing his insurance would not pay for the supplies that we ordered although it appears that he may be able to order this through his supply company that he typically utilizes.  This is [employer]. Nonetheless he did try to order it during the office visit today and it appears this did go through. We will see if he can get that it is a different brand but nonetheless he has collagen and I do think will be beneficial. 02/01/2019 on evaluation today patient actually appears to be doing a [name] worse today in regard to the overall size of his wounds. Fortunately there  is no signs of active infection at this time. That is visually. Nonetheless when this is happened before it was due to infection. For that reason were somewhat concerned about that this time as well. 02/08/2019 on evaluation today patient unfortunately appears to be doing slightly worse with regard to his wound upon evaluation today. Is measuring a [name] deeper and a [name] larger unfortunately. I am not really sure exactly what is causing this to enlarge he actually did see his dermatologist she is going to see about initiating Humira for him. Subsequently she also did do steroid injections into the wound itself in the periphery. Nonetheless still nonetheless he seems to be getting a [name] bit larger he is gone back to just using the steroid cream topically which I think is appropriate. I would say hold off on the collagen for the time being is definitely a good thing to do. Based on the culture results which we finally did get the final result back regarding it shows staph as the bacteria noted again that can be a normal skin bacteria based on the fact however he is having increased drainage and worsening of the wound measurement wise I would go ahead and place him on an antibiotic today I do believe for this. 02/15/2019 on evaluation today patient actually appears to be doing somewhat better in regard to his ulcer. There is no signs of worsening at this time I did review his culture results which showed evidence of Staphylococcus aureus but not MRSA. Again this could just be more related to the normal skin  bacteria although he states the drainage has slowed down quite a bit he may have had a mild infection not just colonization. And was much smaller and then since around10/04/2019 on evaluation today patient appears to be doing unfortunately worse as far as the size of the wound. I really feel like that this is steadily getting larger again it had been doing excellent right at the beginning of September we have seen a steady increase in the area of the wound it is almost 2-1/2 times the size it was on September 1. Obviously this is a bad trend this is not wanting to see. For that reason we went back to using just the topical triamcinolone cream which does seem to help with inflammation. I checked him for bacteria by way of culture and nothing showed positive there. I am considering giving him a short course of a tapering steroid Dosepak today to see if that is can be beneficial for him. The patient is in agreement with giving that a try. 03/08/2019 on evaluation today patient appears to be doing very well in comparison to last evaluation with regard to his lower extremity ulcer. This is showing signs of less inflammation and actually measuring slightly smaller compared to last time every other week over the past month and a half he has been measuring larger larger larger. Nonetheless I do believe that the issue has been inflammation the prednisone does seem to [facility], [place] ([id number]) have been beneficial for him which is good news. No fevers, chills, nausea, vomiting, or diarrhea. 03/22/2019 on evaluation today patient appears to be doing about the same with regard to his leg ulcer. He has been tolerating the dressing changes without complication. With that being said the wound seems to be mostly arrested at its current size but really is not making any progress except for when we prescribed the prednisone. He did show some signs of dropping as far as  the overall size of the wound during that interval  week. Nonetheless this is something he is not on long-term at this point and unfortunately I think he is getting need either this or else the Humira which his dermatologist has discussed try to get approval for. With that being said he will be seeing his dermatologist on the 11th of this month that is November. 04/19/2019 on evaluation today patient appears to be doing really about the same the wound is measuring slightly larger compared to last time I saw him. He has not been into the office since November 2 due to the fact that he unfortunately had Covid as that his entire family. He tells me that it was rough but they did pull-through and he seems to be doing much better. Fortunately there is no signs of active infection at this time. No fevers, chills, nausea, vomiting, or diarrhea. 05/10/2019 on evaluation today patient unfortunately appears to be doing significantly worse as compared to last time I saw him. He does tell me that he has had his first dose of Humira and actually is scheduled to get the next one in the upcoming week. With that being said he tells me also that in the past several days he has been having a lot of issues with green drainage she showed me a picture this is more blue-green in color. He is also been having issues with increased sloughy buildup and the wound does appear to be larger today. Obviously this is not the direction that we want everything to take based on the starting of his Humira. Nonetheless I think this is definitely a result of likely infection and to be honest I think this is probably Pseudomonas causing the infection based on what I am seeing. 05/24/2019 on evaluation today patient unfortunately appears to be doing significantly worse compared to his prior evaluation with me 2 weeks ago. I did review his culture results which showed that he does have Staph aureus as well as Pseudomonas noted on the culture. Nonetheless the Levaquin that I prescribed for him  does not appear to have been appropriate and in fact he tells me he is no longer experiencing the green drainage and discharge that he had at the last visit. Fortunately there is no signs of active infection at this time which is good news although the wound has significantly worsened it in fact is much deeper than it was previous. We have been utilizing up to this point triamcinolone ointment as the prescription topical of choice but at this time I really feel like that the wound is getting need to be packed in order to appropriately manage this due to the deeper nature of the wound. Therefore something along the lines of an alginate dressing may be more appropriate. 05/31/2019 upon inspection today patient's wound actually showed signs of doing poorly at this point. Unfortunately he just does not seem to be making any good progress despite what we have tried. He actually did go ahead and pick up the Cipro and start taking that as he was noticing more green drainage he had previously completed the Levaquin that I prescribed for him as well. Nonetheless he missed his appointment for the seventh last week on Wednesday with the wound care center and [facility] where his dermatologist referred him. Obviously I do think a second opinion would be helpful at this point especially in light of the fact that the patient seems to be doing so poorly despite the fact  that we have tried everything that I really know how at this point. The only thing that ever seems to have helped him in the past is when he was on high doses of continual steroids that did seem to make a difference for him. 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 06/07/2019 upon evaluation today patient unfortunately appears to be doing worse yet again with regard to his wound. In fact I  am starting to question whether or not he may have a fluid pocket in the muscle at this point based on the bulging and the soft appearance to the central portion of the muscle area. There is not anything draining from the muscle itself at this time which is good news but nonetheless the wound is expanding. I am not really seeing any results of the Humira as far as overall wound progression based on what I am seeing at this point. The patient has been referred for second opinion with regard to his wound to the [facility] wound care center by his dermatologist which I definitely am not in opposition to. Unfortunately we tried multiple dressings in the past including collagen, alginate, and at one point even Hydrofera Blue. With that being said he is never really used it for any significant amount of time due to the fact that he often complains of pain associated with these dressings and then will go back to either using the Santyl which she has done intermittently or more frequently the triamcinolone. He is also using his own compression stockings. We have wrapped him in the past but again that was something else that he really was not a big fan of. 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 He seemed to do much better when he was on the steroids although honestly I understand that the steroids are not necessarily the best medication to be on long-term obviously 06/14/2019 on evaluation today patient appears to be doing actually a [name] bit better with regard to the overall appearance with his leg. Unfortunately he does continue to have issues with what appears to be some fluid underneath  the muscle although he did see the wound specialty center at [facility] last week their main goals were to see about infusion therapy in place of the Humira as they feel like that is not quite strong enough. They also recommended that we continue with the treatment otherwise as we are they felt like that was appropriate and they are okay with him continuing to follow-up here with [place] in that regard. With that being said they are also sending him to the vein specialist there to see about vein stripping and if that would be of benefit for him. Subsequently they also did not really address whether or not an ultrasound of the muscle area to see if there is anything that needs to be addressed here would be appropriate or not. For that reason I discussed this with him last week I think we  may proceed down that road at this point. 06/21/2019 upon evaluation today patient's wound actually appears to be doing slightly better compared to previous evaluations. I do believe that he has made a difference with regard to the progression here with the use of oral steroids. Again in the past has been the only thing that is really calm things down. He does tell me that from [facility] is gotten a good news from there that there are no further vein stripping that is necessary at this point. I do not have that available for review today although the patient did relay this to me. He also did obtain and have the ultrasound of the wound completed which I did sign off on today. It does appear that there is no fluid collection under the muscle this is likely then just edematous tissue in general. That is also good news. Overall I still believe the inflammation is the main issue here. He did inquire about the possibility of a wound VAC again with the muscle protruding like it is I am not really sure whether the wound VAC is necessarily ideal or not. That is something we will have to consider although I do believe he may need compression  wrapping to try to help with edema control which could potentially be of benefit. 06/28/2019 on evaluation today patient appears to be doing slightly better measurement wise although this is not terribly smaller he least seems to be trending towards that direction. With that being said he still seems to have purulent drainage noted in the wound bed at this time. He has been on Levaquin followed by Cipro over the past month. Unfortunately he still seems to have some issues with active infection at this time. I did perform a culture last week in order to evaluate and see if indeed there was still anything going on. Subsequently the culture did come back showing Pseudomonas which is consistent with the drainage has been having which is blue-green in color. He also has had an odor that again was somewhat consistent with Pseudomonas as well. Long story short it appears that the culture showed an intermediate finding with regard to how well the Cipro will work for the Pseudomonas infection. Subsequently being that he does not seem to be clearing up and at best what we are doing is just keeping this at [place] I think he may need to see infectious disease to discuss IV antibiotic options. NICKOLI, BAGHERI ([id number]) 07/05/2019 upon evaluation today patient appears to be doing okay in regard to his leg ulcer. He has been tolerating the dressing changes at this point without complication. Fortunately there is no signs of active infection at this time which is good news. No fevers, chills, nausea, vomiting, or diarrhea. With that being said he does have an appointment with infectious disease tomorrow and his primary care on Wednesday. Again the reason for the infectious disease referral was due to the fact that he did not seem to be fully resolving with the use of oral antibiotics and therefore we were thinking that IV antibiotic therapy may be necessary secondary to the fact that there was an intermediate finding for  how effective the Cipro may be. Nonetheless again he has been having a lot of purulent and even green drainage. Fortunately right now that seems to have calmed down over the past week with the reinitiation of the oral antibiotic. Nonetheless we will see what Dr. Megan Salon has to say. 07/12/2019 upon evaluation today patient appears to be  doing about the same at this point in regard to his left lower extremity ulcer. Fortunately there is no signs of active infection at this time which is good news I do believe the Levaquin has been beneficial I did review Dr. Hale Bogus note and to be honest I agree that the patient's leg does appear to be doing better currently. What we found in the past as he does not seem to really completely resolve he will stop the antibiotic and then subsequently things will revert back to having issues with blue-green drainage, increased pain, and overall worsening in general. Obviously that is the reason I sent him back to infectious disease. 07/19/2019 upon evaluation today patient appears to be doing roughly the same in size there is really no dramatic improvement. He has started back on the Levaquin at this point and though he seems to be doing okay he did still have a lot of blue/green drainage noted on evaluation today unfortunately. I think that this is still indicative more likely of a Pseudomonas infection as previously noted and again he does see Dr. Megan Salon in just a couple of days. I do not know that were really able to effectively clear this with just oral antibiotics alone based on what I am seeing currently. Nonetheless we are still continue to try to manage as best we can with regard to the patient and his wound. I do think the wrap was helpful in decreasing the edema which is excellent news. No fevers, chills, nausea, vomiting, or diarrhea. 07/26/2019 upon evaluation today patient appears to be doing slightly better with regard to the overall appearance of the muscle  there is no dark discoloration centrally. Fortunately there is no signs of active infection at this time. No fevers, chills, nausea, vomiting, or diarrhea. Patient's wound bed currently the patient did have an appointment with Dr. Megan Salon at infectious disease last week. With that being said Dr. Megan Salon the patient states was still somewhat hesitant about put him on any IV antibiotics he wanted [place] to repeat cultures today and then see where things go going forward. He does look like Dr. Megan Salon because of some improvement the patient did have with the Levaquin wanted [place] to see about repeating cultures. If it indeed grows the Pseudomonas again then he recommended a possibility of considering a PICC line placement and IV antibiotic therapy. He plans to see the patient back in 1 to 2 weeks. 08/02/2019 upon evaluation today patient appears to be doing poorly with regard to his left lower extremity. We did get the results of his culture back it shows that he is still showing evidence of Pseudomonas which is consistent with the purulent/blue-green drainage that he has currently. Subsequently the culture also shows that he now is showing resistance to the oral fluoroquinolones which is unfortunate as that was really the only thing to treat the infection prior. I do believe that he is looking like this is going require IV antibiotic therapy to get this under control. Fortunately there is no signs of systemic infection at this time which is good news. The patient does see Dr. Megan Salon tomorrow. 08/09/2019 upon evaluation today patient appears to be doing better with regard to his left lower extremity ulcer in regard to the overall appearance. He is currently on IV antibiotic therapy. As ordered by Dr. Megan Salon. Currently the patient is on ceftazidime which she is going to take for the next 2 weeks and then follow-up for 4 to 5-week appointment with Dr. Megan Salon.  The patient started this this past Friday  symptoms have not for a total of 3 days currently in full. 08/16/2019 upon evaluation today patient's wound actually does show muscle in the base of the wound but in general does appear to be much better as far as the overall evidence of infection is concerned. In fact I feel like this is for the most part cleared up he still on the IV antibiotics he has not completed the full course yet but I think he is doing much better which is excellent news. 08/23/2019 upon evaluation today patient appears to be doing about the same with regard to his wound at this point. He tells me that he still has pain unfortunately. Fortunately there is no evidence of systemic infection at this time which is great news. There is significant muscle protrusion. 09/13/19 upon evaluation today patient appears to be doing about the same in regard to his leg unfortunately. He still has a lot of drainage coming from the ulceration there is still muscle exposed. With that being said the patient's last wound culture still showed an intermediate finding with regard to the Pseudomonas he still having the bluish/green drainage as well. Overall I do not know that the wound has completely cleared of infection at this point. Fortunately there is no signs of active infection systemically at this point which is good news. 09/20/2019 upon evaluation today patient's wound actually appears to be doing about the same based on what I am seeing currently. I do not see any signs of systemic infection he still does have evidence of some local infection and drainage. He did see Dr. Megan Salon last week and Dr. Megan Salon states that he probably does need a different IV antibiotic although he does not want to put him on this until the patient begins the Remicade infusion which is actually scheduled for about 10 days out from today on 13 May. Following that time Dr. Megan Salon is good to see him back and then will evaluate the feasibility of starting him on the  IV antibiotic therapy once again at that point. I do not disagree with this plan I do believe as Dr. Megan Salon stated in his note that I reviewed today that the patient's issue is multifactorial with the pyoderma being 1 aspect of this that were hoping the Remicade will be helpful for her. In the meantime I think the gentamicin is, helping to keep things under decent okay control in regard to the ulcer. 09/27/2019 upon evaluation today patient appears to be doing about the same with regard to his wound still there is a lot of muscle exposure though he does have some hyper granulation tissue noted around the edge and actually some granulation tissue starting to form over the muscle which is actually good news. Fortunately there is no evidence of active infection which is also good news. His pain is less at this point. 5/21; this is a patient I have not seen in a long time. 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 He has a large wound on the left lateral leg with protruding muscle. He comes in the clinic today showing the same area on his left medial ankle. He says there is been a spot there for some time although we have not previously defined this. Today he has a clearly defined area with slight amount of skin breakdown surrounded by raised areas with a purplish hue in color. This is not painful he says it is irritated.  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 He still has muscle protruding from the lateral portion of his left leg but fortunately the new area that they were concerned about at his last visit does not appear to have opened at this point. He is currently on Remicade infusions and seems to be doing better in my opinion in fact the wound itself seems to be overall much better. The purplish discoloration that he did have seems to have resolved  and I think that is a good sign that hopefully the Remicade is doing its job. He does have some biofilm noted over the surface of the wound. 11/01/2019 on evaluation today patient's wound actually appears to be doing excellent at this time. Fortunately there is no evidence of active infection and overall I feel like he is making great progress. The Remicade seems to be due excellent job in my opinion. RISHAB, STOUDT ([id number]) 11/08/19 evaluation today vision actually appears to be doing quite well with regard to his weight ulcer. He's been tolerating dressing changes without complication. Fortunately there is no evidence of infection. No fevers, chills, nausea, or vomiting noted at this time. Overall states that is having more itching than pain which is actually a good sign in my opinion. 12/13/2019 upon evaluation today patient appears to be doing well today with regard to his wound. He has been tolerating the dressing changes without complication. Fortunately there is no sign of active infection at this time. No fevers, chills, nausea, vomiting, or diarrhea. Overall I feel like the infusion therapy has been very beneficial for him. 01/06/2020 on evaluation today patient appears to be doing well with regard to his wound. This is measuring smaller and actually looks to be doing better. Fortunately there is no signs of active infection at this point. No fevers, chills, nausea, vomiting, or diarrhea. With that being said he does still have the blue-green drainage but this does not seem to be causing any significant issues currently. He has been using the gentamicin that does seem to be keeping things under decent control at this point. He goes later this morning for his next infusion therapy for the pyoderma which seems to also be very beneficial. 02/07/2020 on evaluation today patient appears to be doing about the same in regard to his wounds currently. Fortunately there is no signs of active infection  systemically he does still have evidence of local infection still using gentamicin. He also is showing some signs of improvement albeit slowly I do feel like we are making some progress here. 02/21/2020 upon evaluation today patient appears to be making some signs of improvement the wound is measuring a [name] bit smaller which is great news and overall I am very pleased with where he stands currently. He is going to be having infusion therapy treatment on the 15th of this month. Fortunately there is no signs of active infection at this time. 03/13/2020 I do believe patient's wound is actually showing some signs of improvement here which is great news. He has continue with the infusion therapy through rheumatology/dermatology at [facility]. That does seem to be beneficial. I still think he gets as much benefit from this as he did from the prednisone initially but nonetheless obviously this is less harsh on his body that the prednisone as far as they are concerned. 03/31/2020 on evaluation today patient's wound actually showing signs of some pretty good improvement in regard  to the overall appearance of the wound bed. There is still muscle exposed though he does have some epithelial growth around the edges of the wound. Fortunately there is no signs of active infection at this time. No fevers, chills, nausea, vomiting, or diarrhea. 04/24/2020 upon evaluation today patient appears to be doing about the same in regard to his leg ulcer. He has been tolerating the dressing changes without complication. Fortunately there is no signs of active infection at this time. No fevers, chills, nausea, vomiting, or diarrhea. With that being said he still has a lot of irritation from the bandaging around the edges of the wound. We did discuss today the possibility of a referral to plastic surgery. 05/22/2020 on evaluation today patient appears to be doing well with regard to his wounds all things considered. He has not been able  to get the Chantix apparently there is a recall nurse that I was unaware of put out by [employer] involuntarily. Nonetheless for now I am and I have to do some research into what may be the best option for him to help with quitting in regard to smoking and we discussed that today. 06/26/2020 upon evaluation today patient appears to be doing well with regard to his wound from the standpoint of infection I do not see any signs of infection at this point. With that being said unfortunately he is still continuing to have issues with muscle exposure and again he is not having a whole lot of new skin growth unfortunately. There does not appear to be any signs of active infection at this time. No fevers, chills, nausea, vomiting, or diarrhea. 07/10/2020 upon evaluation today patient appears to be doing a [name] bit more poorly currently compared to where he was previous. I am concerned currently about an active infection that may be getting worse especially in light of the increased size and tenderness of the wound bed. No fevers, chills, nausea, vomiting, or diarrhea. 07/24/2020 upon evaluation today patient appears to be doing poorly in regard to his leg ulcer. He has been tolerating the dressing changes without complication but unfortunately is having a lot of discomfort. 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 Subsequently I think he is going require possibly IV antibiotics to get this under control. I am very concerned about the severity of his infection and the amount of discomfort he is having. 07/31/2020 upon evaluation today patient appears to be doing about the same in regard to his leg wound. He did see Dr. Megan Salon and Dr. Megan Salon is actually going to start him on IV antibiotics. He goes for the PICC line tomorrow. With that being said there do not have that run for 2 weeks and then see how things are doing and depending on how he  is progressing they may extend that a [name] longer. Nonetheless I am glad this is getting ready to be in place and definitely feel it may help the patient. In the meantime is been using mainly triamcinolone to the wound bed has an anti-inflammatory. 08/07/2020 on evaluation today patient appears to be doing well with regard to his wound compared even last week. In the interim he has gotten the PICC line placed and overall this seems to be doing excellent. There does not appear to be any evidence of infection which is great news systemically although locally of course has had the infection this appears to be improving with the use of the antibiotics. 08/14/2020 upon  evaluation today patient's wound actually showing signs of excellent improvement. Overall the irritation has significantly improved the drainage is back down to more of a normal level and his pain is really pretty much nonexistent compared to what it was. Obviously I think that this is significantly improved secondary to the IV antibiotic therapy which has made all the difference in the world. Again he had a resistant form of Pseudomonas for which oral antibiotics just was not cutting it. Nonetheless I do think that still we need to consider the possibility of a surgical closure for this wound is been open so long and to be honest with muscle exposed I think this can be very hard to get this to close outside of this although definitely were still working to try to do what we can in that regard. 08/21/2020 upon evaluation today patient appears to be doing very well with regard to his wounds on the left lateral lower extremity/calf area. Fortunately there does not appear to be signs of active infection which is great news and overall very pleased with where things stand today. He is actually wrapping up his treatment with IV antibiotics tomorrow. After that we will see where things go from there. 08/28/2020 upon evaluation today patient appears  to be doing decently well with regard to his leg ulcer. There does not appear to be any signs of active infection which is great news and overall very pleased with where things stand today. No fevers, chills, nausea, vomiting, or diarrhea. 09/18/2020 upon evaluation today patient appears to be doing well with regard to his infection which I feel like is better. Unfortunately he is not doing as well with regard to the overall size of the wound which is not nearly as good at this point. I feel like that he may be having an issue here with the pyoderma being somewhat out of control. I think that he may benefit from potentially going back and talking to the dermatologist about KAEDEN, MESTER ([id number]) what to do from the pyoderma standpoint. I am not certain if the infusions are helping nearly as much is what the prednisone did in the past. 10/02/2020 upon evaluation today patient appears to be doing well with regard to his leg ulcer. He did go to the Psychiatric nurse. Unfortunately they feel like there is a 10% chance that most that he would be able to heal and that the skin graft would take. Obviously this has led him to not be able to go down that path as far as treatment is concerned. Nonetheless he does seem to be doing a [name] bit better with the prednisone that I gave him last time. I think that he may need to discuss with dermatology the possibility of long-term prednisone as that seems to be what is most helpful for him to be perfectly honest. I am not sure the Remicade is really doing the job. 10/17/2020 upon evaluation today patient appears to be doing a [name] better in regard to his wound. In fact the case has been since we did the prednisone on May 2 for him that we have noticed a [name] bit of improvement each time we have seen a size wise as well as appearance wise as well as pain wise. I think the prednisone has had a greater effect then the infusion therapy has to be perfectly honest. With  that being said the patient also feels significantly better compared to what he was previous. All of this is good news but  nonetheless I am still concerned about the fact that again we are really not set up to long-term manage him as far as prednisone is concerned. Obviously there are things that you need to be watched I completely understand the risk of prednisone usage as well. 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 With all that being said I do believe that we can give him another round of the prednisone which she is requesting today because of the improvement that he seen since we did that first round. 10/30/2020 upon evaluation today patient's wound actually is showing signs of doing quite well. There does not appear to be any evidence of infection which is great news and overall very pleased with where things stand today. No fevers, chills, nausea, vomiting, or diarrhea. He tells me that the prednisone still has seem to have helped he wonders if we can extend that for just a [name] bit longer. He did not have the appointment with a dermatologist although he did have an infusion appointment last Friday. That was at [facility] [place]. With that being said he tells me he could not do both that as well as the appointment with the physician on the same day therefore that is can have to be rescheduled. I really want to see if there is anything they feel like that could be done differently to try to help this out as I am not really certain that the infusions are helping significantly here. 11/13/2020 upon evaluation today patient unfortunately appears to be doing somewhat poorly in regard to his wound I feel like this is actually worsening from the standpoint of the pyoderma spreading. I still feel like that he may need something different as far as trying to manage this going forward. Again we did the prednisone unfortunately his blood sugars are not doing so well  because of this. Nonetheless I believe that the patient likely needs to try topical steroid. We have done triamcinolone for a while I think going with something stronger such as clobetasol could be beneficial again this is not something I do lightly I discussed this with the patient that again this does not normally put underneath an occlusive dressing. Nonetheless I think a thin film as such could help with some of the stronger anti-inflammatory effects. We discussed this today. He would like to try to give this a trial for the next couple weeks. I definitely think that is something that we can do. Evaluate7/03/2021 and today patient's wound bed actually showed signs of doing really about the same. There was a [name] expansion of the size of the wound and that leading edge that we done looking out although the clobetasol does seem to have slowed this down a bit in my opinion. There is just 1 small area that still seems to be progressing based on what I see. Nonetheless I am concerned about the fact this does not seem to be improving if anything seems to be doing a [name] bit worse. I do not know that the infusions are really helping him much as next infusion is August 5 his appointment with dermatology is July 25. Either way I really think that we need to have a conversation potentially about this and I am actually going to see if I can talk with Dr. Lillia Carmel in order to see where things stand as well. 12/11/2020 upon evaluation today patient appears to be doing worse in regard to his leg ulcer. Unfortunately  I just do not think this is making the progress that I would like to see at this point. Honestly he does have an appointment with dermatology and this is in 2 days. I am wondering what they may have to offer to help with this. Right now what I am seeing is that he is continuing to show signs of worsening [name] by [name]. Obviously that is not great at all. Is the exact opposite of what we are  looking for. 12/18/2020 upon evaluation today patient appears to be doing a [name] better in regard to his wound. The dermatologist actually did do some steroid injections into the wound which does seem to have been beneficial in my opinion. That was on the 25th already this looks a [name] better to me than last time I saw him. With that being said we did do a culture and this did show that he has Staph aureus noted in abundance in the wound. With that being said I do think that getting him on an oral antibiotic would be appropriate as well. Also think we can compression wrap and this will make a difference as well. 12/28/2020 upon evaluation today patient's wound is actually showing signs of doing much better. I do believe the compression wrap is helping he has a lot of drainage but to be honest I think that the compression is helping to some degree in this regard as well as not draining through which is also good news. No fevers, chills, nausea, vomiting, or diarrhea. 01/04/2021 upon evaluation today patient appears to be doing well with regard to his wound. Overall things seem to be doing quite well. He did have a [name] bit of reaction to the CarboFlex Sorbact he will be using that any longer. With that being said he is controlled as far as the drainage is concerned overall and seems to be doing quite well. I do not see any signs of active infection at this time which is great news. No fevers, chills, nausea, vomiting, or diarrhea. 01/11/2021 upon evaluation today patient appears to be doing well with regard to his wounds. He has been tolerating the dressing changes without complication. Fortunately there does not appear to be any signs of active infection at this time which is great news. Overall I am extremely pleased with where we stand currently. No fevers, chills, nausea, vomiting, or diarrhea. Where using clobetasol in the wound bed he has a lot of new skin growth which is awesome as  well. 01/18/2021 upon evaluation today patient appears to be doing very well in regard to his leg ulcer. He has been tolerating the dressing changes without complication. Fortunately there does not appear to be any signs of active infection which is great news. In general I think that he is making excellent progress 01/25/2021 upon evaluation today patient appears to be doing well with regard to his wound on the leg. I am actually extremely pleased with where things stand today. There does not appear to be any signs of active infection which is great news and overall I think that we are definitely headed in the appropriate direction based on what I am seeing currently. There does not appear to be any signs of active infection also excellent news. 02/06/2021 upon evaluation today patient appears to be doing well with regard to his wound. Overall visually this is showing signs of significant improvement which is great news. I do not see any signs of active infection systemically which is great even locally  I do not think that we are seeing any major complications here. We did do fluorescence imaging with the [employer] today. The patient does have some odor and drainage noted and again this is something that I think would benefit him to probably come more frequently for nurse visits. 02/19/2021 upon evaluation today patient actually appears to be doing quite well in regard to his wound. He has been tolerating the dressing Starn, Cinque ([id number]) changes without complication and overall I think that this is making excellent progress. I do not see any evidence of active infection at this point which is great news as well. No fevers, chills, nausea, vomiting, or diarrhea. 10/10; wound is made nice progress healthy granulation with a nice rim of epithelialization which seems to be expanding even from last week he has a deeper area in the inferior part of the more distal part of the wound with not quite as  healthy as surface. This area will need to be followed. Using clobetasol and Hydrofera Blue 03/05/2021 upon evaluation today patient appears to be doing very well in regard to his leg ulcer. He has been tolerating dressing changes without complication. Fortunately there does not appear to be any signs of active infection which is great news and overall I am extremely pleased with where we stand currently. 03/12/2021 upon evaluation today patient appears to be doing well with regard to his wound in fact this is extremely extremely good based on what we are seeing today there does not appear to be any signs of active infection and overall I think that he is doing awesome from the standpoint of healing in general. I am extremely pleased with how things seem to be progressing with regard to this pyoderma. Clobetasol has done wonders for him. I think the compression wrapping has also been of great benefit. 03/19/2021 upon evaluation today patient appears to be doing well with regard to his wound. He is tolerating the dressing changes without complication. In fact I feel like that he is actually making excellent progress at this point based on what I am seeing. No fevers, chills, nausea, vomiting, or diarrhea. 03/26/2021 upon evaluation today patient appears to be doing well with regard to his wound. This again is measuring smaller and looking better. Again the progress is slow but nonetheless continual with what we have been seeing. I do believe that the current plan is doing awesome for him. 04/09/2021 upon evaluation today patient appears to be doing well with regard to his leg ulcer. This is showing signs of excellent improvement the muscle is completely closed over and there does not appear to be any evidence of inflammation at this point his drainage is significantly improved. Overall I think that he would be a good candidate for looking into a skin substitute at this point as well. We will get a look  into some approvals in that regard. Potentially TheraSkin as well as Apligraf could both be considered just depending on insurance coverage. 04/16/2021 upon evaluation today patient appears to be doing well at this point. He has been approved for the Apligraf which we could definitely order although I would like to try to get the TheraSkin approved if at all possible. I did fax notes into them today and I Georgina Peer try to give a call as well. Overall the wound appears to be doing decently well today. 04/20/2021 upon evaluation today patient actually appears to be doing quite well in regard to his wound with that being said we  are trying to see what we can do about speeding up the healing process. For that reason we did discuss the possibility of a skin substitute. We got the Apligraf approved. For that reason we will get a go ahead and see what we can do with the Apligraf at this point. I am still trying to get the TheraSkin approved but I have not heard anything from the insurance company yet I have called and talked to them earlier in the week and to be honest this was about half an hour that I spent on the phone they told me I would hear something within 10-15 business days. 04/27/2021 upon evaluation today patient appears to be doing excellent in regard to his wound. I do believe the Apligraf has been beneficial. With that being said he has had a [name] bit of increased pain has been a [name] bit concerned. I do want to go ahead and apply his steroid cream today the clobetasol and then put the Apligraf over I just do not think I want to risk not putting the clobetasol on based on what is been going on here currently. Patient voiced understanding. 05/04/2021 upon evaluation today patient is making excellent improvement. Overall there is definitely decrease in the size of the wound today and I am very pleased in that regard. I do not see any signs of active infection locally nor systemically at this  point. No fevers, chills, nausea, vomiting, or diarrhea. 05/11/2021 upon evaluation today patient appears to be doing well with regard to his wound. He has been tolerating Apligraf's without complication and is making excellent progress this is application #4 today. 12/30; patient here for Apligraf application. Appears to be doing well small wound there has been a lot of healing 05/28/2021 upon evaluation today patient appears to be doing well with regard to his wound. Has been tolerating the dressing changes without complication. Fortunately I do not see any signs of active infection locally nor systemically at this point which is great news. He is done with the Apligraf and the first round and overall this has filled in quite nicely I am not even certain he needs any additional at the moment. I would actually try to go back to clobetasol with [facility] which previously was doing well for him. 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 He is using clobetasol and Hydrofera Blue. Surface area of the wound is smaller and the surface looks very healthy. 06/11/2021 upon evaluation today patient actually appears to be showing signs of good improvement which is great news. Fortunately I do not see any signs of active infection at this time which is also excellent news. I do believe that the patient is doing well with the clobetasol and Hydrofera Blue. He does have some irritation and itching around the rest of the leg will use some triamcinolone over this area. 06/18/2021 upon evaluation today patient appears to be doing well with regard to his wound. He is showing signs of excellent improvement and overall I am extremely pleased with where we stand today. We are moving in the right direction. 06/25/2021 upon evaluation today patient appears to be doing well with regard to his wound. In fact this is doing also not showing signs of excellent improvement  overall very pleased with where we stand today. I do not see any evidence of active infection locally or systemically at this time which is also great news. 07/02/2021 upon evaluation patient's wound  bed actually showed signs of good granulation and epithelization at this point. And this is measuring significantly smaller and overall I am extremely pleased with where we stand today. No fevers, chills, nausea, vomiting, or diarrhea. 07/09/2021 upon evaluation patient's wound bed actually showed signs of good granulation and epithelization at this point. Fortunately there does not appear to be any evidence of active infection locally or systemically which is great news and overall I am extremely pleased with where we stand at this point. Electronic Signature(s) [name], [name] ([id number]) Signed: 07/09/2021 8:47:12 AM By: Worthy Keeler PA-C Entered By: Worthy Keeler on 07/09/2021 08:47:12 [name], [name] ([id number]) -------------------------------------------------------------------------------- Physical Exam Details Patient Name: [name] Date of Service: 07/09/2021 8:15 AM Medical Record Number: [id number] Patient Account Number: [id number] Date of Birth/Sex: June 17, 1980 (40 y.o. M) Treating RN: Levora Dredge Primary Care Provider: [name] Other Clinician: Referring Provider: [name] Treating Provider/Extender: [name] Cliche in Treatment: 25 Constitutional Well-nourished and well-hydrated in no acute distress. Respiratory normal breathing without difficulty. Psychiatric this patient is able to make decisions and demonstrates good insight into disease process. Alert and Oriented x 3. pleasant and cooperative. Notes Patient's wound actually looks better than it did Friday which is good news. Fortunately I do not see any signs of anything worsening at this point which is great news as well. No fevers, chills, nausea, vomiting, or diarrhea. Electronic  Signature(s) Signed: 07/09/2021 8:47:45 AM By: Worthy Keeler PA-C Entered By: Worthy Keeler on 07/09/2021 08:47:45 [name], [name] ([id number]) -------------------------------------------------------------------------------- Physician Orders Details Patient Name: [name] Date of Service: 07/09/2021 8:15 AM Medical Record Number: [id number] Patient Account Number: [id number] Date of Birth/Sex: 26-Aug-1980 (40 y.o. M) Treating RN: Levora Dredge Primary Care Provider: [name] Other Clinician: Referring Provider: [name] Treating Provider/Extender: [name] Cliche in Treatment: [phone] Verbal / Phone Orders: No Diagnosis Coding ICD-10 Coding Code Description I87.2 Venous insufficiency (chronic) (peripheral) L97.222 Non-pressure chronic ulcer of left calf with fat layer exposed E11.622 Type 2 diabetes mellitus with other skin ulcer L88 Pyoderma gangrenosum F17.208 Nicotine dependence, unspecified, with other nicotine-induced disorders Follow-up Appointments o Return Appointment in 1 week. o Nurse Visit as needed - twice a week Bathing/ Shower/ Hygiene o Clean wound with Normal Saline or wound cleanser. o May shower with wound dressing protected with water repellent cover or cast protector. Edema Control - Lymphedema / Segmental Compressive Device / Other o Optional: One layer of unna paste to top of compression wrap (to act as an anchor). o Elevate, Exercise Daily and Avoid Standing for Long Periods of Time. o Elevate legs to the level of the heart and pump ankles as often as possible o Elevate leg(s) parallel to the floor when sitting. Wound Treatment Wound #1 - Lower Leg Wound Laterality: Left, Lateral Cleanser: Wound Cleanser 3 x Per Week/30 Days Discharge Instructions: Wash your hands with soap and water. Remove old dressing, discard into plastic bag and place into trash. Cleanse the wound with Wound Cleanser prior to applying a clean dressing  using gauze sponges, not tissues or cotton balls. Do not scrub or use excessive force. Pat dry using gauze sponges, not tissue or cotton balls. Peri-Wound Care: Moisturizing Lotion 3 x Per Week/30 Days Discharge Instructions: Suggestions: Theraderm, Eucerin, Cetaphil, or patient preference. Topical: Clobetasol Propionate ointment 0.05%, 60 (g) tube 3 x Per Week/30 Days Discharge Instructions: Pt to bring to appt- Topical: Triamcinolone Acetonide Cream, 0.1%, 15 (g) tube 3 x Per Week/30 Days Discharge Instructions: Apply as directed  by provider. To leg not on wound Primary Dressing: Hydrofera Blue Ready Transfer Foam, 4x5 (in/in) 3 x Per Week/30 Days Discharge Instructions: Apply Hydrofera Blue Ready to wound bed as directed Secondary Dressing: Zetuvit Plus Silicone Non-bordered 5x5 (in/in) 3 x Per Week/30 Days Compression Wrap: Medichoice 4 layer Compression System, 35-40 mmHG (Generic) 3 x Per Week/30 Days Discharge Instructions: Apply multi-layer wrap as directed. Electronic Signature(s) Signed: 07/09/2021 4:31:00 PM By: Levora Dredge Signed: 07/09/2021 5:14:22 PM By: Worthy Keeler PA-C Entered By: Levora Dredge on 07/09/2021 08:51:15 [name], [name] ([id number]) [name], [name] ([id number]) -------------------------------------------------------------------------------- Problem List Details Patient Name: [name] Date of Service: 07/09/2021 8:15 AM Medical Record Number: [id number] Patient Account Number: [id number] Date of Birth/Sex: Oct 16, 1980 (40 y.o. M) Treating RN: Levora Dredge Primary Care Provider: [name] Other Clinician: Referring Provider: [name] Treating Provider/Extender: [name] Cliche in Treatment: 44 Active Problems ICD-10 Encounter Code Description Active Date MDM Diagnosis I87.2 Venous insufficiency (chronic) (peripheral) 12/04/2016 No Yes L97.222 Non-pressure chronic ulcer of left calf with fat layer exposed 12/04/2016 No Yes E11.622  Type 2 diabetes mellitus with other skin ulcer 04/09/2021 No Yes L88 Pyoderma gangrenosum 03/26/2017 No Yes F17.208 Nicotine dependence, unspecified, with other nicotine-induced disorders 04/24/2020 No Yes Inactive Problems ICD-10 Code Description Active Date Inactive Date L97.213 Non-pressure chronic ulcer of right calf with necrosis of muscle 04/02/2017 04/02/2017 Resolved Problems ICD-10 Code Description Active Date Resolved Date L97.321 Non-pressure chronic ulcer of left ankle limited to breakdown of skin 10/08/2019 10/08/2019 L03.116 Cellulitis of left lower limb 05/24/2019 05/24/2019 Electronic Signature(s) Signed: 07/09/2021 5:14:22 PM By: Worthy Keeler PA-C Entered By: Worthy Keeler on 07/09/2021 08:22:49 [name], [name] ([id number]) -------------------------------------------------------------------------------- Progress Note Details Patient Name: [name] Date of Service: 07/09/2021 8:15 AM Medical Record Number: [id number] Patient Account Number: [id number] Date of Birth/Sex: 1981-01-12 (40 y.o. M) Treating RN: Levora Dredge Primary Care Provider: [name] Other Clinician: Referring Provider: [name] Treating Provider/Extender: [name] Cliche in Treatment: 74 Subjective Chief Complaint Information obtained from Patient He is here in follow up evaluation for LLE pyoderma ulcer History of Present Illness (HPI) 12/04/16; 41 year old man who comes into the clinic today for review of a wound on the posterior left calf. He tells me that is been there for about a year. He is not a diabetic he does smoke half a pack per day. He was seen in the ER on 11/20/16 felt to have cellulitis around the wound and was given clindamycin. An x-ray did not show osteomyelitis. The patient initially tells me that he has a milk allergy that sets off a pruritic itching rash on his lower legs which she scratches incessantly and he thinks that's what may have set up the wound. He has  been using various topical antibiotics and ointments without any effect. He works in a [employer] and is on his feet all day. He does not have a prior history of wounds however he does have the rash on both lower legs the right arm and the ventral aspect of his left arm. These are excoriations and clearly have had scratching however there are of macular looking areas on both legs including a substantial larger area on the right leg. This does not have an underlying open area. There is no blistering. The patient tells me that 2 years ago in [place] in response to the rash on his legs he saw a dermatologist who told him he had a condition which may be pyoderma gangrenosum although I may be  putting words into his mouth. He seemed to recognize this. On further questioning he admits to a 5 year history of quiesced. ulcerative colitis. He is not in any treatment for this. He's had no recent travel 12/11/16; the patient arrives today with his wound and roughly the same condition we've been using silver alginate this is a deep punched out wound with some surrounding erythema but no tenderness. Biopsy I did did not show confirmed pyoderma gangrenosum suggested nonspecific inflammation and vasculitis but does not provide an actual description of what was seen by the pathologist. I'm really not able to understand this We have also received information from the patient's dermatologist in [place] notes from April 2016. This was a doctor Agarwal-antal. 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 He was prescribed topical steroid high potency under occlusion which helped but at this point the patient did not have a deep punched out wound. 12/18/16; the patient's wound is larger in terms of surface area however this surface looks better and there is less depth. The surrounding erythema also is better. The patient states that the wrap we put on came off 2 days ago when he has been using his compression  stockings. He we are in the process of getting a dermatology consult. 12/26/16 on evaluation today patient's left lower extremity wound shows evidence of infection with surrounding erythema noted. He has been tolerating the dressing changes but states that he has noted more discomfort. There is a larger area of erythema surrounding the wound. No fevers, chills, nausea, or vomiting noted at this time. With that being said the wound still does have slough covering the surface. He is not allergic to any medication that he is aware of at this point. In regard to his right lower extremity he had several regions that are erythematous and pruritic he wonders if there's anything we can do to help that. 01/02/17 I reviewed patient's wound culture which was obtained his visit last week. He was placed on doxycycline at that point. Unfortunately that does not appear to be an antibiotic that would likely help with the situation however the pseudomonas noted on culture is sensitive to Cipro. Also unfortunately patient's wound seems to have a large compared to last week's evaluation. Not severely so but there are definitely increased measurements in general. He is continuing to have discomfort as well he writes this to be a seven out of 10. In fact he would prefer me not to perform any debridement today due to the fact that he is having discomfort and considering he has an active infection on the [name] reluctant to do so anyway. No fevers, chills, nausea, or vomiting noted at this time. 01/08/17; patient seems dermatology on September 5. I suspect dermatology will want the slides from the biopsy I did sent to their pathologist. I'm not sure if there is a way we can expedite that. In any case the culture I did before I left on vacation 3 weeks ago showed Pseudomonas he was given 10 days of Cipro and per her description of her intake nurses is actually somewhat better this week although the wound is quite a bit bigger  than I remember the last time I saw this. He still has 3 more days of Cipro 01/21/17; dermatology appointment tomorrow. He has completed the ciprofloxacin for Pseudomonas. Surface of the wound looks better however he is had some deterioration in the lesions on his right leg. Meantime the left lateral leg wound we will continue  with sample 01/29/17; patient had his dermatology appointment but I can't yet see that note. He is completed his antibiotics. The wound is more superficial but considerably larger in circumferential area than when he came in. This is in his left lateral calf. He also has swollen erythematous areas with superficial wounds on the right leg and small papular areas on both arms. There apparently areas in her his upper thighs and buttocks I did not look at those. Dermatology biopsied the right leg. Hopefully will have their input next week. 02/05/17; patient went back to see his dermatologist who told him that he had a "scratching problem" as well as staph. He is now on a 30 day course of doxycycline and I believe she gave him triamcinolone cream to the right leg areas to help with the itching [not exactly sure but probably triamcinolone]. She apparently looked at the left lateral leg wound although this was not rebiopsied and I think felt to be ultimately part of the same pathogenesis. He is using sample border foam and changing nevus himself. He now has a new open area on the right posterior leg which was his biopsy site I don't have any of the dermatology notes 02/12/17; we put the patient in compression last week with SANTYL to the wound on the left leg and the biopsy. Edema is much better and the depth of the wound is now at level of skin. Area is still the same Biopsy site on the right lateral leg we've also been using santyl with a border foam dressing and he is changing this himself. 02/19/17; Using silver alginate started last week to both the substantial left leg wound and the  biopsy site on the right wound. He is tolerating compression well. Has a an appointment with his primary M.D. tomorrow wondering about diuretics although I'm wondering if the edema problem is actually lymphedema [name], FRITCHMAN ([id number]) 02/26/17; the patient has been to see his primary doctor Dr. Jerrel Ivory at [facility]. She started him on Lasix 20 mg and this seems to have helped with the edema. However we are not making substantial change with the left lateral calf wound and inflammation. The biopsy site on the right leg also looks stable but not really all that different. 03/12/17; the patient has been to see vein and vascular Dr. Lucky Cowboy. He has had venous reflux studies I have not reviewed these. I did get a call from his dermatology office. 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 They therefore recommended clobetasol and tetracycline i.e. aggressive treatment for possible pyoderma gangrenosum. 03/26/17; apparently the patient just had reflux studies not an appointment with Dr. dew. She arrives in clinic today having applied clobetasol for 2-3 weeks. He notes over the last 2-3 days excessive drainage having to change the dressing 3-4 times a day and also expanding erythema. 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 Lillia Carmel at [facility] dermatology locally who kindly saw him at my  request. A repeat biopsy apparently has confirmed pyoderma gangrenosum and he started on  prednisone 60 mg yesterday. My concern was the degree of erythema medially extending from his left leg wound which was either inflammation from pyoderma or cellulitis. I put him on Augmentin however culture of the wound showed Pseudomonas which is quinolone sensitive. I really don't believe he has cellulitis however in view of everything I will continue and give him a course of Cipro. He is also on doxycycline as an immune modulator for the pyoderma. In addition to his original wound on the left lateral leg with surrounding erythema he has a wound on the right posterior calf which was an original biopsy site done by dermatology. This was felt to represent pathergy from pyoderma gangrenosum 04/16/17; pyoderma gangrenosum. Saw Dr. Lillia Carmel yesterday. He has been using topical antibiotics to both wound areas his original wound on the left and the biopsies/pathergy area on the right. There is definitely some improvement in the inflammation around the wound on the right although the patient states he has increasing sensitivity of the wounds. He is on prednisone 60 and doxycycline 1 as prescribed by Dr. Lillia Carmel. He is covering the topical antibiotic with gauze and putting this in his own compression stocks and changing this daily. He states that Dr. Lottie Rater did a culture of the left leg wound yesterday 05/07/17; pyoderma gangrenosum. The patient saw Dr. Lillia Carmel yesterday and has a follow-up with her in one month. He is still using topical antibiotics to both wounds although he can't recall exactly what type. He is still on prednisone 60 mg. Dr. Lillia Carmel stated that the doxycycline could stop if we were in agreement. He has been using his own compression stocks changing daily 06/11/17; pyoderma gangrenosum with wounds on the left lateral leg and right medial leg. The right medial leg was induced by biopsy/pathergy. The area on the right is essentially healed. Still on high-dose prednisone using  topical antibiotics to the wound 07/09/17; pyoderma gangrenosum with wounds on the left lateral leg. The right medial leg has closed and remains closed. He is still on prednisone 60. He tells me he missed his last dermatology appointment with Dr. Lillia Carmel but will make another appointment. He reports that her blood sugar at a recent screen in [place] was high 200's. He was 180 today. 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 I think he is going to require still much longer prednisone perhaps another 3 months before attempting to taper. In the meantime his wound is a lot better. Smaller. He is cleaning this off daily and applying topical antibiotics. When he was last in the clinic I thought about changing to [facility] and actually put in a couple of calls to dermatology although probably not during their business hours. 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 He continues on oral prednisone. He has been using triple antibiotic ointment. There is surface debris and we will transition to [facility] and have him return in 2 weeks. He has lost 30 pounds since his last appointment with lifestyle modification. He may benefit from topical steroid cream for treatment this can be considered at a later date. 08/22/17 on evaluation today patient appears to actually be doing rather well in regard to his left lateral lower extremity ulcer. He has actually been managed by Dr. Dellia Nims most recently.  Patient is currently on oral steroids at this time. This seems to have been of benefit for him. Nonetheless his last visit was actually with Leah on 08/06/17. Currently he is not utilizing any topical steroid creams although this could be of benefit as well. No fevers, chills, nausea, or vomiting noted at this time. 09/05/17 on evaluation today patient appears to be doing better in regard to  his left lateral lower extremity ulcer. He has been tolerating the dressing changes without complication. He is using Santyl with good effect. Overall I'm very pleased with how things are standing at this point. Patient likewise is happy that this is doing better. 09/19/17 on evaluation today patient actually appears to be doing rather well in regard to his left lateral lower extremity ulcer. 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 He's not having any significant pain. 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 He has been tolerating the Santyl without complication and in general I feel like he's making good progress. 10/17/17 on evaluation today patient appears to be doing very well in regard to his left lateral lower surety ulcer. He has been tolerating the dressing changes without complication. There does not appear to be any evidence of infection he's alternating the Santyl and the triple antibiotic ointment every other day this seems to be doing well for him. 11/03/17 on evaluation today patient appears to be doing very well in regard to his left lateral lower extremity ulcer. He is been tolerating the dressing changes without complication which is good news. Fortunately there does not appear to be any evidence of infection which is also great news. Overall is doing excellent they are starting to taper down on the prednisone is down to 40 mg at this point it also started topical clobetasol for him. 11/17/17 on evaluation today patient appears to be doing well in regard to his left lateral lower surety ulcer. He's been tolerating the dressing changes without complication. He does note that he is having no pain, no excessive drainage or discharge, and overall he feels like things are going about how he would expect  and hope they would. Overall he seems to have no evidence of infection at this time in my opinion which is good news. 12/04/17-He is seen in follow-up evaluation for right lateral lower extremity ulcer. He has been applying topical steroid cream. Today's measurement show slight increase in size. Over the next 2 weeks we will transition to every other day Santyl and steroid cream. He has been encouraged to monitor for changes and notify clinic with any concerns 12/15/17 on evaluation today patient's left lateral motion the ulcer and fortunately is doing worse again at this point. This just since last week to this week has close to doubled in size according to the patient. I did not seeing last week's I do not have a visual to compare this to in our system was also down so we do not have all the charts and at this point. Nonetheless it does have me somewhat concerned in regard to the fact that again he was worried enough about it he has contact the dermatology that placed them back on the full strength, 50 mg a day of the prednisone that he was taken previous. He continues to alternate using clobetasol along with Santyl at this point.  He is obviously somewhat frustrated. KOLLIN, [name] ([id number]) 12/22/17 on evaluation today patient appears to be doing a [name] worse compared to last evaluation. Unfortunately the wound is a [name] deeper and slightly larger than the last week's evaluation. With that being said he has made some progress in regard to the irritation surrounding at this time unfortunately despite that progress that's been made he still has a significant issue going on here. 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 For that reason I discussed with him today the possibility of perform a wound culture to ensure there's nothing overtly  infected. 01/06/18 on evaluation today patient's wound is larger and deeper than previously evaluated. With that being said it did appear that his wound was infected after my last evaluation with him. Subsequently I did end up prescribing a prescription for Bactrim DS which she has been taking and having no complication with. Fortunately there does not appear to be any evidence of infection at this point in time as far as anything spreading, no want to touch, and overall I feel like things are showing signs of improvement. 01/13/18 on evaluation today patient appears to be even a [name] larger and deeper than last time. There still muscle exposed in the base of the wound. Nonetheless he does appear to be less erythematous I do believe inflammation is calming down also believe the infection looks like it's probably resolved at this time based on what I'm seeing. No fevers, chills, nausea, or vomiting noted at this time. 01/30/18 on evaluation today patient actually appears to visually look better for the most part. Unfortunately those visually this looks better he does seem to potentially have what may be an abscess in the muscle that has been noted in the central portion of the wound. This is the first time that I have noted what appears to be fluctuance in the central portion of the muscle. With that being said I'm somewhat more concerned about the fact that this might indicate an abscess formation at this location. I do believe that an ultrasound would be appropriate. This is likely something we need to try to do as soon as possible. He has been switch to mupirocin ointment and he is no longer using the steroid ointment as prescribed by dermatology he sees them again next week he's been decreased from 60 to 40 mg of prednisone. 03/09/18 on evaluation today patient actually appears to be doing a [name] better compared to last time I saw him. There's not as much erythema surrounding the wound itself. He  I did review his most recent infectious disease note which was dated 02/24/18. He saw Dr. Michel Bickers in [place]. 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 Patient is now off of antibiotics and they are continually observing this. There seems to be no change in the past two weeks in my pinion based on what the patient says and what I see today compared to what Dr. Megan Salon likely saw two weeks ago. No fevers, chills, nausea, or vomiting noted at this time. 03/23/18 on evaluation today patient's wound actually appears to be showing signs of improvement which is good news. He is currently still on the Dapsone. He is also working on tapering the prednisone to get off of this  and Dr. Lottie Rater is working with him in this regard. Nonetheless overall I feel like the wound is doing well it does appear based on the infectious disease note that I reviewed from Dr. 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 I did also review the results of his ultrasound of left lower extremity which revealed there was a dentist tissue in the base of the wound without an abscess noted. 04/06/18 on evaluation today the patient's left lateral lower extremity ulcer actually appears to be doing fairly well which is excellent news. There does not appear to be any evidence of infection at this time which is also great news. Overall he still does have a significantly large ulceration although [name] by [name] he seems to be making progress. He is down to 10 mg a day of the prednisone. 04/20/18 on evaluation today patient actually appears to be doing excellent at this time in regard to his left lower extremity ulcer. He's making signs of good progress unfortunately this is taking much longer than we would really like to see but  nonetheless he is making progress. Fortunately there does not appear to be any evidence of infection at this time. No fevers, chills, nausea, or vomiting noted at this time. The patient has not been using the Santyl due to the cost he hadn't got in this field yet. He's mainly been using the antibiotic ointment topically. Subsequently he also tells me that he really has not been scrubbing in the shower I think this would be helpful again as I told him it doesn't have to be anything too aggressive to even make it believe just enough to keep it free of some of the loose slough and biofilm on the wound surface. 05/11/18 on evaluation today patient's wound appears to be making slow but sure progress in regard to the left lateral lower extremity ulcer. He is been tolerating the dressing changes without complication. Fortunately there does not appear to be any evidence of infection at this time. He is still just using triple antibiotic ointment along with clobetasol occasionally over the area. He never got the Santyl and really does not seem to intend to in my pinion. 06/01/18 on evaluation today patient appears to be doing a [name] better in regard to his left lateral lower extremity ulcer. He states that overall he does not feel like he is doing as well with the Dapsone as he did with the prednisone. Nonetheless he sees his dermatologist later today and is gonna talk to them about the possibility of going back on the prednisone. Overall again I believe that the wound would be better if you would utilize Santyl but he really does not seem to be interested in going back to the [place] at this point. He has been using triple antibiotic ointment. 06/15/18 on evaluation today patient's wound actually appears to be doing about the same at this point. Fortunately there is no signs of infection at this time. He has made slight improvements although he continues to not really want to clean the wound bed at this point.  He states that he just doesn't mess with it he doesn't want to cause any problems with everything else he has going on. He has been on medication, antibiotics as prescribed by his dermatologist, for a staff infection of his lower extremities which is really drying out now and looking much  better he tells me. Fortunately there is no sign of overall infection. 06/29/18 on evaluation today patient appears to be doing well in regard to his left lateral lower surety ulcer all things considering. Fortunately his staff infection seems to be greatly improved compared to previous. He has no signs of infection and this is drying up quite nicely. He is still the doxycycline for this is no longer on cental, Dapsone, or any of the other medications. His dermatologist has recommended possibility of an infusion but right now he does not want to proceed with that. 07/13/18 on evaluation today patient appears to be doing about the same in regard to his left lateral lower surety ulcer. Fortunately there's no signs of infection at this time which is great news. Unfortunately he still builds up a significant amount of Slough/biofilm of the surface of the wound he still is not really cleaning this as he should be appropriately. Again I'm able to easily with saline and gauze remove the majority of this on the surface which if you would do this at home would likely be a dramatic improvement for him as far as getting the area to improve. Nonetheless overall I still feel like he is making progress is just very slow. I think Santyl will be of benefit for him as well. Still he has not gotten this as of this point. 07/27/18 on evaluation today patient actually appears to be doing [name] worse in regards of the erythema around the periwound region of the wound he also tells me that he's been having more drainage currently compared to what he was experiencing last time I saw him. He states not quite as bad as what he had because this  was infected previously but nonetheless is still appears to be doing poorly. Fortunately there is no evidence of systemic infection at this point. The patient tells me that he is not going to be able to afford the Santyl. He is still waiting to hear about the infusion therapy with his dermatologist. Apparently she wants an updated colonoscopy first. KARI, KERTH ([id number]) 08/10/18 on evaluation today patient appears to be doing better in regard to his left lateral lower extremity ulcer. Fortunately he is showing signs of improvement in this regard he's actually been approved for Remicade infusion's as well although this has not been scheduled as of yet. Fortunately there's no signs of active infection at this time in regard to the wound although he is having some issues with infection of the right lower extremity is been seen as dermatologist for this. Fortunately they are definitely still working with him trying to keep things under control. 09/07/18 on evaluation today patient is actually doing rather well in regard to his left lateral lower extremity ulcer. He notes these actually having some hair grow back on his extremity which is something he has not seen in years. He also tells me that the pain is really not giving them any trouble at this time which is also good news overall she is very pleased with the progress he's using a combination of the mupirocin along with the probate is all mixed. 09/21/18 on evaluation today patient actually appears to be doing fairly well all things considered in regard to his looks from the ulcer. He's been tolerating the dressing changes without complication. Fortunately there's no signs of active infection at this time which is good news he is still on all antibiotics or prevention of the staff infection. He has been on prednisone for time  although he states it is gonna contact his dermatologist and see if she put them on a short course due to some irritation that  he has going on currently. Fortunately there's no evidence of any overall worsening this is going very slow I think cental would be something that would be helpful for him although he states that $50 for tube is quite expensive. He therefore is not willing to get that at this point. 10/06/18 on evaluation today patient actually appears to be doing decently well in regard to his left lateral leg ulcer. He's been tolerating the dressing changes without complication. Fortunately there's no signs of active infection at this time. Overall I'm actually rather pleased with the progress he's making although it's slow he doesn't show any signs of infection and he does seem to be making some improvement. I do believe that he may need a switch up and dressings to try to help this to heal more appropriately and quickly. 10/19/18 on evaluation today patient actually appears to be doing better in regard to his left lateral lower extremity ulcer. This is shown signs of having much less Slough buildup at this point due to the fact he has been using the [employer]. Obviously this is very good news. The overall size of the wound is not dramatically smaller but again the appearance is. 11/02/18 on evaluation today patient actually appears to be doing quite well in regard to his lower [place] ulcer. A lot of the skin around the ulcer is actually somewhat irritating at this point this seems to be more due to the dressing causing irritation from the adhesive that anything else. Fortunately there is no signs of active infection at this time. 11/24/18 on evaluation today patient appears to be doing a [name] worse in regard to his overall appearance of his lower extremity ulcer. There's more erythema and warmth around the wound unfortunately. He is currently on doxycycline which he has been on for some time. With that being said I'm not sure that seems to be helping with what appears to possibly be an acute cellulitis with regard to  his left lower extremity ulcer. No fevers, chills, nausea, or vomiting noted at this time. 12/08/18 on evaluation today patient's wounds actually appears to be doing significantly better compared to his last evaluation. He has been using Santyl along with alternating tripling about appointment as well as the steroid cream seems to be doing quite well and the wound is showing signs of improvement which is excellent news. Fortunately there's no evidence of infection and in fact his culture came back negative with only normal skin flora noted. 12/21/2018 upon evaluation today patient actually appears to be doing excellent with regard to his ulcer. This is actually the best that I have seen it since have been helping to take care of him. It is both smaller as well as less slough noted on the surface of the wound and seems to be showing signs of good improvement with new skin growing from the edges. He has been using just the triamcinolone he does wonder if he can get a refill of that ointment today. 01/04/2019 upon evaluation today patient actually appears to be doing well with regard to his left lateral lower extremity ulcer. With that being said it does not appear to be that he is doing quite as well as last time as far as progression is concerned. There does not appear to be any signs of infection or significant irritation which is good news.  With that being said I do believe that he may benefit from switching to a collagen based dressing based on how clean The wound appears. 01/18/2019 on evaluation today patient actually appears to be doing well with regard to his wound on the left lower extremity. He is not made a lot of progress compared to where we were previous but nonetheless does seem to be doing okay at this time which is good news. There is no signs of active infection which is also good news. My only concern currently is I do wish we can get him into utilizing the collagen dressing his  insurance would not pay for the supplies that we ordered although it appears that he may be able to order this through his supply company that he typically utilizes. This is [employer]. Nonetheless he did try to order it during the office visit today and it appears this did go through. We will see if he can get that it is a different brand but nonetheless he has collagen and I do think will be beneficial. 02/01/2019 on evaluation today patient actually appears to be doing a [name] worse today in regard to the overall size of his wounds. Fortunately there is no signs of active infection at this time. That is visually. Nonetheless when this is happened before it was due to infection. For that reason were somewhat concerned about that this time as well. 02/08/2019 on evaluation today patient unfortunately appears to be doing slightly worse with regard to his wound upon evaluation today. Is measuring a [name] deeper and a [name] larger unfortunately. I am not really sure exactly what is causing this to enlarge he actually did see his dermatologist she is going to see about initiating Humira for him. Subsequently she also did do steroid injections into the wound itself in the periphery. Nonetheless still nonetheless he seems to be getting a [name] bit larger he is gone back to just using the steroid cream topically which I think is appropriate. I would say hold off on the collagen for the time being is definitely a good thing to do. Based on the culture results which we finally did get the final result back regarding it shows staph as the bacteria noted again that can be a normal skin bacteria based on the fact however he is having increased drainage and worsening of the wound measurement wise I would go ahead and place him on an antibiotic today I do believe for this. 02/15/2019 on evaluation today patient actually appears to be doing somewhat better in regard to his ulcer. There is no signs of worsening at  this time I did review his culture results which showed evidence of Staphylococcus aureus but not MRSA. Again this could just be more related to the normal skin bacteria although he states the drainage has slowed down quite a bit he may have had a mild infection not just colonization. And was much smaller and then since around10/04/2019 on evaluation today patient appears to be doing unfortunately worse as far as the size of the wound. I really feel like that this is steadily getting larger again it had been doing excellent right at the beginning of September we have seen a steady increase in the area of the wound it is almost 2-1/2 times the size it was on September 1. Obviously this is a bad trend this is not wanting to see. For that reason we went back to using just the topical triamcinolone cream which does seem to  help with inflammation. I checked him for bacteria by way of culture and nothing showed positive there. I am considering giving him a short course of a tapering steroid Nickoles Gregori, Dyland ([id number]) today to see if that is can be beneficial for him. The patient is in agreement with giving that a try. 03/08/2019 on evaluation today patient appears to be doing very well in comparison to last evaluation with regard to his lower extremity ulcer. This is showing signs of less inflammation and actually measuring slightly smaller compared to last time every other week over the past month and a half he has been measuring larger larger larger. Nonetheless I do believe that the issue has been inflammation the prednisone does seem to have been beneficial for him which is good news. No fevers, chills, nausea, vomiting, or diarrhea. 03/22/2019 on evaluation today patient appears to be doing about the same with regard to his leg ulcer. He has been tolerating the dressing changes without complication. With that being said the wound seems to be mostly arrested at its current size but really is not  making any progress except for when we prescribed the prednisone. He did show some signs of dropping as far as the overall size of the wound during that interval week. Nonetheless this is something he is not on long-term at this point and unfortunately I think he is getting need either this or else the Humira which his dermatologist has discussed try to get approval for. With that being said he will be seeing his dermatologist on the 11th of this month that is November. 04/19/2019 on evaluation today patient appears to be doing really about the same the wound is measuring slightly larger compared to last time I saw him. He has not been into the office since November 2 due to the fact that he unfortunately had Covid as that his entire family. He tells me that it was rough but they did pull-through and he seems to be doing much better. Fortunately there is no signs of active infection at this time. No fevers, chills, nausea, vomiting, or diarrhea. 05/10/2019 on evaluation today patient unfortunately appears to be doing significantly worse as compared to last time I saw him. He does tell me that he has had his first dose of Humira and actually is scheduled to get the next one in the upcoming week. With that being said he tells me also that in the past several days he has been having a lot of issues with green drainage she showed me a picture this is more blue-green in color. He is also been having issues with increased sloughy buildup and the wound does appear to be larger today. Obviously this is not the direction that we want everything to take based on the starting of his Humira. Nonetheless I think this is definitely a result of likely infection and to be honest I think this is probably Pseudomonas causing the infection based on what I am seeing. 05/24/2019 on evaluation today patient unfortunately appears to be doing significantly worse compared to his prior evaluation with me 2 weeks ago. I did  review his culture results which showed that he does have Staph aureus as well as Pseudomonas noted on the culture. Nonetheless the Levaquin that I prescribed for him does not appear to have been appropriate and in fact he tells me he is no longer experiencing the green drainage and discharge that he had at the last visit. Fortunately there is no signs of active  infection at this time which is good news although the wound has significantly worsened it in fact is much deeper than it was previous. We have been utilizing up to this point triamcinolone ointment as the prescription topical of choice but at this time I really feel like that the wound is getting need to be packed in order to appropriately manage this due to the deeper nature of the wound. Therefore something along the lines of an alginate dressing may be more appropriate. 05/31/2019 upon inspection today patient's wound actually showed signs of doing poorly at this point. Unfortunately he just does not seem to be making any good progress despite what we have tried. He actually did go ahead and pick up the Cipro and start taking that as he was noticing more green drainage he had previously completed the Levaquin that I prescribed for him as well. Nonetheless he missed his appointment for the seventh last week on Wednesday with the wound care center and [facility] where his dermatologist referred him. Obviously I do think a second opinion would be helpful at this point especially in light of the fact that the patient seems to be doing so poorly despite the fact that we have tried everything that I really know how at this point. The only thing that ever seems to have helped him in the past is when he was on high doses of continual steroids that did seem to make a difference for him. Right now he is on immune modulating medication to try to help with the pyoderma but I am not sure that he is getting as much relief at this point as he is  previously obtained from the use of steroids. 06/07/2019 upon evaluation today patient unfortunately appears to be doing worse yet again with regard to his wound. In fact I am starting to question whether or not he may have a fluid pocket in the muscle at this point based on the bulging and the soft appearance to the central portion of the muscle area. There is not anything draining from the muscle itself at this time which is good news but nonetheless the wound is expanding. I am not really seeing any results of the Humira as far as overall wound progression based on what I am seeing at this point. The patient has been referred for second opinion with regard to his wound to the [facility] wound care center by his dermatologist which I definitely am not in opposition to. Unfortunately we tried multiple dressings in the past including collagen, alginate, and at one point even Hydrofera Blue. With that being said he is never really used it for any significant amount of time due to the fact that he often complains of pain associated with these dressings and then will go back to either using the Santyl which she has done intermittently or more frequently the triamcinolone. He is also using his own compression stockings. We have wrapped him in the past but again that was something else that he really was not a big fan of. 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 He seemed to do much better when he  was on the steroids although honestly I understand that the steroids are not necessarily the best medication to be on long-term obviously 06/14/2019 on evaluation today patient appears to be doing  actually a [name] bit better with regard to the overall appearance with his leg. Unfortunately he does continue to have issues with what appears to be some fluid underneath the muscle although he did see the wound specialty center at [facility] last week their main goals were to see about infusion therapy in place of the Humira as they feel like that is not quite strong enough. They also recommended that we continue with the treatment otherwise as we are they felt like that was appropriate and they are okay with him continuing to follow-up here with [place] in that regard. With that being said they are also sending him to the vein specialist there to see about vein stripping and if that would be of benefit for him. Subsequently they also did not really address whether or not an ultrasound of the muscle area to see if there is anything that needs to be addressed here would be appropriate or not. For that reason I discussed this with him last week I think we may proceed down that road at this point. 06/21/2019 upon evaluation today patient's wound actually appears to be doing slightly better compared to previous evaluations. I do believe that he has made a difference with regard to the progression here with the use of oral steroids. Again in the past has been the only thing that is really calm things down. He does tell me that from [facility] is gotten a good news from there that there are no further vein stripping that is necessary at this point. I do not have that available for review today although the patient did relay this to me. He also did obtain and have the ultrasound of the wound completed which I did sign off on today. It does appear that there is no fluid collection under the muscle this is likely then just edematous tissue in general. That is also good news. Overall I still believe the inflammation is the main issue here. He did inquire about the possibility of a wound VAC again with the muscle protruding like  it is I am not really sure whether the wound VAC is necessarily ideal or not. That is something we will have to consider although I do believe he may need compression wrapping to try to help with edema control which could potentially be of benefit. 06/28/2019 on evaluation today patient appears to be doing slightly better measurement wise although this is not terribly smaller he least seems to be trending towards that direction. With that being said he still seems to have purulent drainage noted in the wound bed at this time. He has been on Levaquin followed by Cipro over the past month. Unfortunately he still seems to have some issues with active infection at this time. I did perform a culture last week in order to evaluate and see if indeed there was still anything going on. Subsequently the culture did come back Selle, Williom ([id number]) showing Pseudomonas which is consistent with the drainage has been having which is blue-green in color. He also has had an odor that again was somewhat consistent with Pseudomonas as well. Long story short it appears that the culture showed an intermediate finding with regard to how well the Cipro will work for the Pseudomonas infection. Subsequently being that he does not seem to be clearing  up and at best what we are doing is just keeping this at [place] I think he may need to see infectious disease to discuss IV antibiotic options. 07/05/2019 upon evaluation today patient appears to be doing okay in regard to his leg ulcer. He has been tolerating the dressing changes at this point without complication. Fortunately there is no signs of active infection at this time which is good news. No fevers, chills, nausea, vomiting, or diarrhea. With that being said he does have an appointment with infectious disease tomorrow and his primary care on Wednesday. Again the reason for the infectious disease referral was due to the fact that he did not seem to be fully resolving with  the use of oral antibiotics and therefore we were thinking that IV antibiotic therapy may be necessary secondary to the fact that there was an intermediate finding for how effective the Cipro may be. Nonetheless again he has been having a lot of purulent and even green drainage. Fortunately right now that seems to have calmed down over the past week with the reinitiation of the oral antibiotic. Nonetheless we will see what Dr. Megan Salon has to say. 07/12/2019 upon evaluation today patient appears to be doing about the same at this point in regard to his left lower extremity ulcer. Fortunately there is no signs of active infection at this time which is good news I do believe the Levaquin has been beneficial I did review Dr. Hale Bogus note and to be honest I agree that the patient's leg does appear to be doing better currently. What we found in the past as he does not seem to really completely resolve he will stop the antibiotic and then subsequently things will revert back to having issues with blue-green drainage, increased pain, and overall worsening in general. Obviously that is the reason I sent him back to infectious disease. 07/19/2019 upon evaluation today patient appears to be doing roughly the same in size there is really no dramatic improvement. He has started back on the Levaquin at this point and though he seems to be doing okay he did still have a lot of blue/green drainage noted on evaluation today unfortunately. I think that this is still indicative more likely of a Pseudomonas infection as previously noted and again he does see Dr. Megan Salon in just a couple of days. I do not know that were really able to effectively clear this with just oral antibiotics alone based on what I am seeing currently. Nonetheless we are still continue to try to manage as best we can with regard to the patient and his wound. I do think the wrap was helpful in decreasing the edema which is excellent news. No  fevers, chills, nausea, vomiting, or diarrhea. 07/26/2019 upon evaluation today patient appears to be doing slightly better with regard to the overall appearance of the muscle there is no dark discoloration centrally. Fortunately there is no signs of active infection at this time. No fevers, chills, nausea, vomiting, or diarrhea. Patient's wound bed currently the patient did have an appointment with Dr. Megan Salon at infectious disease last week. With that being said Dr. Megan Salon the patient states was still somewhat hesitant about put him on any IV antibiotics he wanted [place] to repeat cultures today and then see where things go going forward. He does look like Dr. Megan Salon because of some improvement the patient did have with the Levaquin wanted [place] to see about repeating cultures. If it indeed grows the Pseudomonas again then he recommended  a possibility of considering a PICC line placement and IV antibiotic therapy. He plans to see the patient back in 1 to 2 weeks. 08/02/2019 upon evaluation today patient appears to be doing poorly with regard to his left lower extremity. We did get the results of his culture back it shows that he is still showing evidence of Pseudomonas which is consistent with the purulent/blue-green drainage that he has currently. Subsequently the culture also shows that he now is showing resistance to the oral fluoroquinolones which is unfortunate as that was really the only thing to treat the infection prior. I do believe that he is looking like this is going require IV antibiotic therapy to get this under control. Fortunately there is no signs of systemic infection at this time which is good news. The patient does see Dr. Megan Salon tomorrow. 08/09/2019 upon evaluation today patient appears to be doing better with regard to his left lower extremity ulcer in regard to the overall appearance. He is currently on IV antibiotic therapy. As ordered by Dr. Megan Salon. Currently the patient is on  ceftazidime which she is going to take for the next 2 weeks and then follow-up for 4 to 5-week appointment with Dr. Megan Salon. The patient started this this past Friday symptoms have not for a total of 3 days currently in full. 08/16/2019 upon evaluation today patient's wound actually does show muscle in the base of the wound but in general does appear to be much better as far as the overall evidence of infection is concerned. In fact I feel like this is for the most part cleared up he still on the IV antibiotics he has not completed the full course yet but I think he is doing much better which is excellent news. 08/23/2019 upon evaluation today patient appears to be doing about the same with regard to his wound at this point. He tells me that he still has pain unfortunately. Fortunately there is no evidence of systemic infection at this time which is great news. There is significant muscle protrusion. 09/13/19 upon evaluation today patient appears to be doing about the same in regard to his leg unfortunately. He still has a lot of drainage coming from the ulceration there is still muscle exposed. With that being said the patient's last wound culture still showed an intermediate finding with regard to the Pseudomonas he still having the bluish/green drainage as well. Overall I do not know that the wound has completely cleared of infection at this point. Fortunately there is no signs of active infection systemically at this point which is good news. 09/20/2019 upon evaluation today patient's wound actually appears to be doing about the same based on what I am seeing currently. I do not see any signs of systemic infection he still does have evidence of some local infection and drainage. He did see Dr. Megan Salon last week and Dr. Megan Salon states that he probably does need a different IV antibiotic although he does not want to put him on this until the patient begins the Remicade infusion which is actually  scheduled for about 10 days out from today on 13 May. Following that time Dr. Megan Salon is good to see him back and then will evaluate the feasibility of starting him on the IV antibiotic therapy once again at that point. I do not disagree with this plan I do believe as Dr. Megan Salon stated in his note that I reviewed today that the patient's issue is multifactorial with the pyoderma being 1 aspect of  this that were hoping the Remicade will be helpful for her. In the meantime I think the gentamicin is, helping to keep things under decent okay control in regard to the ulcer. 09/27/2019 upon evaluation today patient appears to be doing about the same with regard to his wound still there is a lot of muscle exposure though he does have some hyper granulation tissue noted around the edge and actually some granulation tissue starting to form over the muscle which is actually good news. Fortunately there is no evidence of active infection which is also good news. His pain is less at this point. 5/21; this is a patient I have not seen in a long time. 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 He has a large wound on the left lateral leg with protruding muscle. He comes in the clinic today showing the same area on his left medial ankle. He says there is been a spot there for some time although we have not previously defined this. Today he has a clearly defined area with slight amount of skin breakdown surrounded by raised areas with a purplish hue in color. This is not painful he says it is irritated. 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 He still has muscle protruding from the lateral portion of his left leg but fortunately the new area that they were concerned about at his last visit does not appear to have opened at this point. He is currently on Remicade  infusions and seems to be doing better in my opinion in fact the wound itself seems to be overall much better. The purplish discoloration that he did have seems to have resolved and I think that is a good sign that hopefully the Remicade is doing its job. He does Josephs, Roosvelt ([id number]) have some biofilm noted over the surface of the wound. 11/01/2019 on evaluation today patient's wound actually appears to be doing excellent at this time. Fortunately there is no evidence of active infection and overall I feel like he is making great progress. The Remicade seems to be due excellent job in my opinion. 11/08/19 evaluation today vision actually appears to be doing quite well with regard to his weight ulcer. He's been tolerating dressing changes without complication. Fortunately there is no evidence of infection. No fevers, chills, nausea, or vomiting noted at this time. Overall states that is having more itching than pain which is actually a good sign in my opinion. 12/13/2019 upon evaluation today patient appears to be doing well today with regard to his wound. He has been tolerating the dressing changes without complication. Fortunately there is no sign of active infection at this time. No fevers, chills, nausea, vomiting, or diarrhea. Overall I feel like the infusion therapy has been very beneficial for him. 01/06/2020 on evaluation today patient appears to be doing well with regard to his wound. This is measuring smaller and actually looks to be doing better. Fortunately there is no signs of active infection at this point. No fevers, chills, nausea, vomiting, or diarrhea. With that being said he does still have the blue-green drainage but this does not seem to be causing any significant issues currently. He has been using the gentamicin that does seem to be keeping things under decent control at this point. He goes later this morning for his next infusion therapy for the pyoderma which seems to also be  very beneficial.  02/07/2020 on evaluation today patient appears to be doing about the same in regard to his wounds currently. Fortunately there is no signs of active infection systemically he does still have evidence of local infection still using gentamicin. He also is showing some signs of improvement albeit slowly I do feel like we are making some progress here. 02/21/2020 upon evaluation today patient appears to be making some signs of improvement the wound is measuring a [name] bit smaller which is great news and overall I am very pleased with where he stands currently. He is going to be having infusion therapy treatment on the 15th of this month. Fortunately there is no signs of active infection at this time. 03/13/2020 I do believe patient's wound is actually showing some signs of improvement here which is great news. He has continue with the infusion therapy through rheumatology/dermatology at [facility]. That does seem to be beneficial. I still think he gets as much benefit from this as he did from the prednisone initially but nonetheless obviously this is less harsh on his body that the prednisone as far as they are concerned. 03/31/2020 on evaluation today patient's wound actually showing signs of some pretty good improvement in regard to the overall appearance of the wound bed. There is still muscle exposed though he does have some epithelial growth around the edges of the wound. Fortunately there is no signs of active infection at this time. No fevers, chills, nausea, vomiting, or diarrhea. 04/24/2020 upon evaluation today patient appears to be doing about the same in regard to his leg ulcer. He has been tolerating the dressing changes without complication. Fortunately there is no signs of active infection at this time. No fevers, chills, nausea, vomiting, or diarrhea. With that being said he still has a lot of irritation from the bandaging around the edges of the wound. We did discuss today the  possibility of a referral to plastic surgery. 05/22/2020 on evaluation today patient appears to be doing well with regard to his wounds all things considered. He has not been able to get the Chantix apparently there is a recall nurse that I was unaware of put out by [employer] involuntarily. Nonetheless for now I am and I have to do some research into what may be the best option for him to help with quitting in regard to smoking and we discussed that today. 06/26/2020 upon evaluation today patient appears to be doing well with regard to his wound from the standpoint of infection I do not see any signs of infection at this point. With that being said unfortunately he is still continuing to have issues with muscle exposure and again he is not having a whole lot of new skin growth unfortunately. There does not appear to be any signs of active infection at this time. No fevers, chills, nausea, vomiting, or diarrhea. 07/10/2020 upon evaluation today patient appears to be doing a [name] bit more poorly currently compared to where he was previous. I am concerned currently about an active infection that may be getting worse especially in light of the increased size and tenderness of the wound bed. No fevers, chills, nausea, vomiting, or diarrhea. 07/24/2020 upon evaluation today patient appears to be doing poorly in regard to his leg ulcer. He has been tolerating the dressing changes without complication but unfortunately is having a lot of discomfort. 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 Subsequently I think he is going require possibly IV antibiotics to  get this under control. I am very concerned about the severity of his infection and the amount of discomfort he is having. 07/31/2020 upon evaluation today patient appears to be doing about the same in regard to his leg wound. He did see Dr. Megan Salon and Dr. Megan Salon is actually going to start  him on IV antibiotics. He goes for the PICC line tomorrow. With that being said there do not have that run for 2 weeks and then see how things are doing and depending on how he is progressing they may extend that a [name] longer. Nonetheless I am glad this is getting ready to be in place and definitely feel it may help the patient. In the meantime is been using mainly triamcinolone to the wound bed has an anti-inflammatory. 08/07/2020 on evaluation today patient appears to be doing well with regard to his wound compared even last week. In the interim he has gotten the PICC line placed and overall this seems to be doing excellent. There does not appear to be any evidence of infection which is great news systemically although locally of course has had the infection this appears to be improving with the use of the antibiotics. 08/14/2020 upon evaluation today patient's wound actually showing signs of excellent improvement. Overall the irritation has significantly improved the drainage is back down to more of a normal level and his pain is really pretty much nonexistent compared to what it was. Obviously I think that this is significantly improved secondary to the IV antibiotic therapy which has made all the difference in the world. Again he had a resistant form of Pseudomonas for which oral antibiotics just was not cutting it. Nonetheless I do think that still we need to consider the possibility of a surgical closure for this wound is been open so long and to be honest with muscle exposed I think this can be very hard to get this to close outside of this although definitely were still working to try to do what we can in that regard. 08/21/2020 upon evaluation today patient appears to be doing very well with regard to his wounds on the left lateral lower extremity/calf area. Fortunately there does not appear to be signs of active infection which is great news and overall very pleased with where things stand  today. He is actually wrapping up his treatment with IV antibiotics tomorrow. After that we will see where things go from there. 08/28/2020 upon evaluation today patient appears to be doing decently well with regard to his leg ulcer. There does not appear to be any signs of Rodell, Kaydn ([id number]) active infection which is great news and overall very pleased with where things stand today. No fevers, chills, nausea, vomiting, or diarrhea. 09/18/2020 upon evaluation today patient appears to be doing well with regard to his infection which I feel like is better. Unfortunately he is not doing as well with regard to the overall size of the wound which is not nearly as good at this point. I feel like that he may be having an issue here with the pyoderma being somewhat out of control. I think that he may benefit from potentially going back and talking to the dermatologist about what to do from the pyoderma standpoint. I am not certain if the infusions are helping nearly as much is what the prednisone did in the past. 10/02/2020 upon evaluation today patient appears to be doing well with regard to his leg ulcer. He did go to the Psychiatric nurse. Unfortunately  they feel like there is a 10% chance that most that he would be able to heal and that the skin graft would take. Obviously this has led him to not be able to go down that path as far as treatment is concerned. Nonetheless he does seem to be doing a [name] bit better with the prednisone that I gave him last time. I think that he may need to discuss with dermatology the possibility of long-term prednisone as that seems to be what is most helpful for him to be perfectly honest. I am not sure the Remicade is really doing the job. 10/17/2020 upon evaluation today patient appears to be doing a [name] better in regard to his wound. In fact the case has been since we did the prednisone on May 2 for him that we have noticed a [name] bit of improvement each time we  have seen a size wise as well as appearance wise as well as pain wise. I think the prednisone has had a greater effect then the infusion therapy has to be perfectly honest. With that being said the patient also feels significantly better compared to what he was previous. All of this is good news but nonetheless I am still concerned about the fact that again we are really not set up to long-term manage him as far as prednisone is concerned. Obviously there are things that you need to be watched I completely understand the risk of prednisone usage as well. 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 With all that being said I do believe that we can give him another round of the prednisone which she is requesting today because of the improvement that he seen since we did that first round. 10/30/2020 upon evaluation today patient's wound actually is showing signs of doing quite well. There does not appear to be any evidence of infection which is great news and overall very pleased with where things stand today. No fevers, chills, nausea, vomiting, or diarrhea. He tells me that the prednisone still has seem to have helped he wonders if we can extend that for just a [name] bit longer. He did not have the appointment with a dermatologist although he did have an infusion appointment last Friday. That was at [facility]. With that being said he tells me he could not do both that as well as the appointment with the physician on the same day therefore that is can have to be rescheduled. I really want to see if there is anything they feel like that could be done differently to try to help this out as I am not really certain that the infusions are helping significantly here. 11/13/2020 upon evaluation today patient unfortunately appears to be doing somewhat poorly in regard to his wound I feel like this is actually worsening from the standpoint of the pyoderma spreading. I still  feel like that he may need something different as far as trying to manage this going forward. Again we did the prednisone unfortunately his blood sugars are not doing so well because of this. Nonetheless I believe that the patient likely needs to try topical steroid. We have done triamcinolone for a while I think going with something stronger such as clobetasol could be beneficial again this is not something I do lightly I discussed this with the patient that again this does not normally put underneath an occlusive dressing. Nonetheless I think a thin film as such could help  with some of the stronger anti-inflammatory effects. We discussed this today. He would like to try to give this a trial for the next couple weeks. I definitely think that is something that we can do. Evaluate7/03/2021 and today patient's wound bed actually showed signs of doing really about the same. There was a [name] expansion of the size of the wound and that leading edge that we done looking out although the clobetasol does seem to have slowed this down a bit in my opinion. There is just 1 small area that still seems to be progressing based on what I see. Nonetheless I am concerned about the fact this does not seem to be improving if anything seems to be doing a [name] bit worse. I do not know that the infusions are really helping him much as next infusion is August 5 his appointment with dermatology is July 25. Either way I really think that we need to have a conversation potentially about this and I am actually going to see if I can talk with Dr. Lillia Carmel in order to see where things stand as well. 12/11/2020 upon evaluation today patient appears to be doing worse in regard to his leg ulcer. Unfortunately I just do not think this is making the progress that I would like to see at this point. Honestly he does have an appointment with dermatology and this is in 2 days. I am wondering what they may have to offer to help with  this. Right now what I am seeing is that he is continuing to show signs of worsening [name] by [name]. Obviously that is not great at all. Is the exact opposite of what we are looking for. 12/18/2020 upon evaluation today patient appears to be doing a [name] better in regard to his wound. The dermatologist actually did do some steroid injections into the wound which does seem to have been beneficial in my opinion. That was on the 25th already this looks a [name] better to me than last time I saw him. With that being said we did do a culture and this did show that he has Staph aureus noted in abundance in the wound. With that being said I do think that getting him on an oral antibiotic would be appropriate as well. Also think we can compression wrap and this will make a difference as well. 12/28/2020 upon evaluation today patient's wound is actually showing signs of doing much better. I do believe the compression wrap is helping he has a lot of drainage but to be honest I think that the compression is helping to some degree in this regard as well as not draining through which is also good news. No fevers, chills, nausea, vomiting, or diarrhea. 01/04/2021 upon evaluation today patient appears to be doing well with regard to his wound. Overall things seem to be doing quite well. He did have a [name] bit of reaction to the CarboFlex Sorbact he will be using that any longer. With that being said he is controlled as far as the drainage is concerned overall and seems to be doing quite well. I do not see any signs of active infection at this time which is great news. No fevers, chills, nausea, vomiting, or diarrhea. 01/11/2021 upon evaluation today patient appears to be doing well with regard to his wounds. He has been tolerating the dressing changes without complication. Fortunately there does not appear to be any signs of active infection at this time which is great news. Overall I am  extremely pleased with  where we stand currently. No fevers, chills, nausea, vomiting, or diarrhea. Where using clobetasol in the wound bed he has a lot of new skin growth which is awesome as well. 01/18/2021 upon evaluation today patient appears to be doing very well in regard to his leg ulcer. He has been tolerating the dressing changes without complication. Fortunately there does not appear to be any signs of active infection which is great news. In general I think that he is making excellent progress 01/25/2021 upon evaluation today patient appears to be doing well with regard to his wound on the leg. I am actually extremely pleased with where things stand today. There does not appear to be any signs of active infection which is great news and overall I think that we are definitely headed in the appropriate direction based on what I am seeing currently. There does not appear to be any signs of active infection also excellent news. 02/06/2021 upon evaluation today patient appears to be doing well with regard to his wound. Overall visually this is showing signs of significant Strada, Yosef ([id number]) improvement which is great news. I do not see any signs of active infection systemically which is great even locally I do not think that we are seeing any major complications here. We did do fluorescence imaging with the [employer] today. The patient does have some odor and drainage noted and again this is something that I think would benefit him to probably come more frequently for nurse visits. 02/19/2021 upon evaluation today patient actually appears to be doing quite well in regard to his wound. He has been tolerating the dressing changes without complication and overall I think that this is making excellent progress. I do not see any evidence of active infection at this point which is great news as well. No fevers, chills, nausea, vomiting, or diarrhea. 10/10; wound is made nice progress healthy granulation with a nice  rim of epithelialization which seems to be expanding even from last week he has a deeper area in the inferior part of the more distal part of the wound with not quite as healthy as surface. This area will need to be followed. Using clobetasol and Hydrofera Blue 03/05/2021 upon evaluation today patient appears to be doing very well in regard to his leg ulcer. He has been tolerating dressing changes without complication. Fortunately there does not appear to be any signs of active infection which is great news and overall I am extremely pleased with where we stand currently. 03/12/2021 upon evaluation today patient appears to be doing well with regard to his wound in fact this is extremely extremely good based on what we are seeing today there does not appear to be any signs of active infection and overall I think that he is doing awesome from the standpoint of healing in general. I am extremely pleased with how things seem to be progressing with regard to this pyoderma. Clobetasol has done wonders for him. I think the compression wrapping has also been of great benefit. 03/19/2021 upon evaluation today patient appears to be doing well with regard to his wound. He is tolerating the dressing changes without complication. In fact I feel like that he is actually making excellent progress at this point based on what I am seeing. No fevers, chills, nausea, vomiting, or diarrhea. 03/26/2021 upon evaluation today patient appears to be doing well with regard to his wound. This again is measuring smaller and looking better. Again the progress is  slow but nonetheless continual with what we have been seeing. I do believe that the current plan is doing awesome for him. 04/09/2021 upon evaluation today patient appears to be doing well with regard to his leg ulcer. This is showing signs of excellent improvement the muscle is completely closed over and there does not appear to be any evidence of inflammation at this  point his drainage is significantly improved. Overall I think that he would be a good candidate for looking into a skin substitute at this point as well. We will get a look into some approvals in that regard. Potentially TheraSkin as well as Apligraf could both be considered just depending on insurance coverage. 04/16/2021 upon evaluation today patient appears to be doing well at this point. He has been approved for the Apligraf which we could definitely order although I would like to try to get the TheraSkin approved if at all possible. I did fax notes into them today and I Georgina Peer try to give a call as well. Overall the wound appears to be doing decently well today. 04/20/2021 upon evaluation today patient actually appears to be doing quite well in regard to his wound with that being said we are trying to see what we can do about speeding up the healing process. For that reason we did discuss the possibility of a skin substitute. We got the Apligraf approved. For that reason we will get a go ahead and see what we can do with the Apligraf at this point. I am still trying to get the TheraSkin approved but I have not heard anything from the insurance company yet I have called and talked to them earlier in the week and to be honest this was about half an hour that I spent on the phone they told me I would hear something within 10-15 business days. 04/27/2021 upon evaluation today patient appears to be doing excellent in regard to his wound. I do believe the Apligraf has been beneficial. With that being said he has had a [name] bit of increased pain has been a [name] bit concerned. I do want to go ahead and apply his steroid cream today the clobetasol and then put the Apligraf over I just do not think I want to risk not putting the clobetasol on based on what is been going on here currently. Patient voiced understanding. 05/04/2021 upon evaluation today patient is making excellent improvement. Overall there  is definitely decrease in the size of the wound today and I am very pleased in that regard. I do not see any signs of active infection locally nor systemically at this point. No fevers, chills, nausea, vomiting, or diarrhea. 05/11/2021 upon evaluation today patient appears to be doing well with regard to his wound. He has been tolerating Apligraf's without complication and is making excellent progress this is application #4 today. 12/30; patient here for Apligraf application. Appears to be doing well small wound there has been a lot of healing 05/28/2021 upon evaluation today patient appears to be doing well with regard to his wound. Has been tolerating the dressing changes without complication. Fortunately I do not see any signs of active infection locally nor systemically at this point which is great news. He is done with the Apligraf and the first round and overall this has filled in quite nicely I am not even certain he needs any additional at the moment. I would actually try to go back to clobetasol with [name] which previously was doing well for  him. 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 He is using clobetasol and Hydrofera Blue. Surface area of the wound is smaller and the surface looks very healthy. 06/11/2021 upon evaluation today patient actually appears to be showing signs of good improvement which is great news. Fortunately I do not see any signs of active infection at this time which is also excellent news. I do believe that the patient is doing well with the clobetasol and Hydrofera Blue. He does have some irritation and itching around the rest of the leg will use some triamcinolone over this area. 06/18/2021 upon evaluation today patient appears to be doing well with regard to his wound. He is showing signs of excellent improvement and overall I am extremely pleased with where we stand today. We are moving in the right  direction. 06/25/2021 upon evaluation today patient appears to be doing well with regard to his wound. In fact this is doing also not showing signs of excellent improvement overall very pleased with where we stand today. I do not see any evidence of active infection locally or systemically at this time which is also great news. 07/02/2021 upon evaluation patient's wound bed actually showed signs of good granulation and epithelization at this point. And this is measuring significantly smaller and overall I am extremely pleased with where we stand today. No fevers, chills, nausea, vomiting, or diarrhea. 07/09/2021 upon evaluation patient's wound bed actually showed signs of good granulation and epithelization at this point. Fortunately there does not appear to be any evidence of active infection locally or systemically which is great news and overall I am extremely pleased with where we ZEPPELIN, COMMISSO ([id number]) stand at this point. Objective Constitutional Well-nourished and well-hydrated in no acute distress. Vitals Time Taken: 8:16 AM, Height: 71 in, Weight: 338 lbs, BMI: 47.1, Temperature: 97.8 F, Pulse: 80 bpm, Respiratory Rate: 18 breaths/min, Blood Pressure: 135/85 mmHg. Respiratory normal breathing without difficulty. Psychiatric this patient is able to make decisions and demonstrates good insight into disease process. Alert and Oriented x 3. pleasant and cooperative. General Notes: Patient's wound actually looks better than it did Friday which is good news. Fortunately I do not see any signs of anything worsening at this point which is great news as well. No fevers, chills, nausea, vomiting, or diarrhea. Integumentary (Hair, Skin) Wound #1 status is Open. Original cause of wound was Gradually Appeared. The date acquired was: 11/18/2015. The wound has been in treatment 239 weeks. The wound is located on the Left,Lateral Lower Leg. The wound measures 2.2cm length x 1.5cm width x 0.1cm depth;  2.592cm^2 area and 0.259cm^3 volume. There is Fat Layer (Subcutaneous Tissue) exposed. There is no tunneling or undermining noted. There is a medium amount of serosanguineous drainage noted. The wound margin is flat and intact. There is large (67-100%) red, pink granulation within the wound bed. There is a small (1-33%) amount of necrotic tissue within the wound bed including Adherent Slough. 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 Would recommend that we go ahead and continue with the wound care measures as before. We are using the clobetasol followed by a triamcinolone to the leg itself to help with any irritation or itching. He is doing great in that regard. 2. I am also can recommend that we continue with  the [facility] which seems to right now be doing a pretty good job. 3. I did suggest that if he is not seeing improvement continuing next week then we may switch over to a collagen based dressing but I did state to make a switch being that things have been doing so well up to this point. We will see how this goes. We will see patient back for reevaluation in 1 week here in the clinic. If anything worsens or changes patient will contact our office for additional recommendations. Electronic Signature(s) Signed: 07/09/2021 8:48:30 AM By: Worthy Keeler PA-C Entered By: Worthy Keeler on 07/09/2021 08:48:30 Whidden, Jayke ([id number]) -------------------------------------------------------------------------------- SuperBill Details Patient Name: [name] Date of Service: 07/09/2021 Medical Record Number: [id number] Patient Account Number: [id number] Date of Birth/Sex: 1981-04-27 (40 y.o. M) Treating RN: Levora Dredge Primary Care  Provider: [name] Other Clinician: Referring Provider: [name] Treating Provider/Extender: [name] Cliche in Treatment: 239 Diagnosis Coding ICD-10 Codes Code Description I87.2 Venous insufficiency (chronic) (peripheral) L97.222 Non-pressure chronic ulcer of left calf with fat layer exposed E11.622 Type 2 diabetes mellitus with other skin ulcer L88 Pyoderma gangrenosum F17.208 Nicotine dependence, unspecified, with other nicotine-induced disorders Facility Procedures CPT4 Code: 09983382 Description: (Facility Use Only) [phone] - [place] LWR LT LEG Modifier: Quantity: 1 Physician Procedures CPT4 Code: 7341937 Description: 90240 - WC PHYS LEVEL 4 - EST PT Modifier: Quantity: 1 CPT4 Code: Description: ICD-10 Diagnosis Description I87.2 Venous insufficiency (chronic) (peripheral) L97.222 Non-pressure chronic ulcer of left calf with fat layer exposed E11.622 Type 2 diabetes mellitus with other skin ulcer L88 Pyoderma gangrenosum Modifier: Quantity: Electronic Signature(s) Signed: 07/09/2021 4:31:00 PM By: Levora Dredge Signed: 07/09/2021 5:14:22 PM By: Worthy Keeler PA-C Previous Signature: 07/09/2021 8:48:45 AM Version By: Worthy Keeler PA-C Entered By: Levora Dredge on 07/09/2021 08:52:52

## 2021-07-11 ENCOUNTER — Other Ambulatory Visit: Payer: Self-pay

## 2021-07-11 DIAGNOSIS — I872 Venous insufficiency (chronic) (peripheral): Secondary | ICD-10-CM | POA: Diagnosis not present

## 2021-07-11 NOTE — Progress Notes (Signed)
[name], [name] ([id number]) Visit Report for 07/11/2021 Arrival Information Details Patient Name: [name], [name] Date of Service: 07/11/2021 8:00 AM Medical Record Number: [id number] Patient Account Number: [id number] Date of Birth/Sex: 1981-01-01 (41 y.o. M) Treating RN: Donnamarie Poag Primary Care Montgomery Rothlisberger: [name] Other Clinician: Referring Carolan Avedisian: [name] Treating Langston Tuberville/Extender: Yaakov Guthrie in Treatment: 240 Visit Information History Since Last Visit Added or deleted any medications: No Patient Arrived: Ambulatory Had a fall or experienced change in No Arrival Time: 08:07 activities of daily living that may affect Accompanied By: self risk of falls: Transfer Assistance: None Hospitalized since last visit: No Patient Identification Verified: Yes Has Dressing in Place as Prescribed: Yes Secondary Verification Process Completed: Yes Has Compression in Place as Prescribed: Yes Patient Requires Transmission-Based No Pain Present Now: No Precautions: Patient Has Alerts: Yes Patient Alerts: Patient has reaction to silver dressings. Electronic Signature(s) Signed: 07/11/2021 1:59:49 PM By: Donnamarie Poag Entered By: Donnamarie Poag on 07/11/2021 08:20:13 [name] ([id number]) -------------------------------------------------------------------------------- Compression Therapy Details Patient Name: [name] Date of Service: 07/11/2021 8:00 AM Medical Record Number: [id number] Patient Account Number: [id number] Date of Birth/Sex: 1980-09-14 (40 y.o. M) Treating RN: Donnamarie Poag Primary Care Carnetta Losada: [name] Other Clinician: Referring [name]: [name] Treating Janiya Millirons/Extender: Yaakov Guthrie in Treatment: 240 Compression Therapy Performed for Wound Assessment: Wound #1 Left,Lateral Lower Leg Performed By: Junius Argyle, RN Compression Type: Four Layer Electronic Signature(s) Signed: 07/11/2021 1:59:49 PM By: Donnamarie Poag Entered By: Donnamarie Poag on 07/11/2021 08:21:49 [name] ([id number]) -------------------------------------------------------------------------------- Encounter Discharge Information Details Patient Name: [name] Date of Service: 07/11/2021 8:00 AM Medical Record Number: [id number] Patient Account Number: [id number] Date of Birth/Sex: [birth year] (40 y.o. M) Treating RN: Donnamarie Poag Primary Care Sallie Staron: [name] Other Clinician: Referring Nikesh Teschner: [name] Treating Janelly Switalski/Extender: Yaakov Guthrie in Treatment: 240 Encounter Discharge Information Items Discharge Condition: Stable Ambulatory Status: Ambulatory Discharge Destination: Home Transportation: Private Auto Accompanied By: self Schedule Follow-up Appointment: Yes Clinical Summary of Care: Electronic Signature(s) Signed: 07/11/2021 1:59:49 PM By: Donnamarie Poag Entered By: Donnamarie Poag on 07/11/2021 08:23:08 [name] ([id number]) -------------------------------------------------------------------------------- Wound Assessment Details Patient Name: [name] Date of Service: 07/11/2021 8:00 AM Medical Record Number: [id number] Patient Account Number: [id number] Date of Birth/Sex: 12-27-1980 (40 y.o. M) Treating RN: Donnamarie Poag Primary Care Adileny Delon: [name] Other Clinician: Referring Gwynne Kemnitz: [name] Treating Amalee [name]/Extender: Yaakov Guthrie in Treatment: 240 Wound Status Wound Number: 1 Primary Etiology: Pyoderma Wound Location: Left, Lateral Lower Leg Wound Status: Open Wounding Event: Gradually Appeared Comorbid History: Sleep Apnea, Hypertension, Colitis Date Acquired: 11/18/2015 Weeks Of Treatment: 240 Clustered Wound: No Wound Measurements Length: (cm) 2.2 Width: (cm) 1.5 Depth: (cm) 0.1 Area: (cm) 2.592 Volume: (cm) 0.259 % Reduction in Area: 47.2% % Reduction in Volume: 93.4% Epithelialization: Medium (34-66%) Wound  Description Classification: Full Thickness With Exposed Support Structures Wound Margin: Flat and Intact Exudate Amount: Medium Exudate Type: Serosanguineous Exudate Color: red, brown Foul Odor After Cleansing: No Slough/Fibrino Yes Wound Bed Granulation Amount: Large (67-100%) Exposed Structure Granulation Quality: Red, Pink Fascia Exposed: No Necrotic Amount: Small (1-33%) Fat Layer (Subcutaneous Tissue) Exposed: Yes Necrotic Quality: Adherent Slough Tendon Exposed: No Muscle Exposed: No Joint Exposed: No Bone Exposed: No Treatment Notes Wound #1 (Lower Leg) Wound Laterality: Left, Lateral Cleanser Wound Cleanser Discharge Instruction: Wash your hands with soap and water. Remove old dressing, discard into plastic bag and place into trash. Cleanse the wound with Wound Cleanser prior to applying a clean dressing using gauze sponges,  not tissues or cotton balls. Do not scrub or use excessive force. Pat dry using gauze sponges, not tissue or cotton balls. Peri-Wound Care Moisturizing Lotion Discharge Instruction: Suggestions: Theraderm, Eucerin, Cetaphil, or patient preference. Topical Clobetasol Propionate ointment 0.05%, 60 (g) tube Discharge Instruction: Pt to bring to appt- Triamcinolone Acetonide Cream, 0.1%, 15 (g) tube Discharge Instruction: Apply as directed by [name]. To leg not on wound Primary Dressing Hydrofera Blue Ready Transfer Foam, 4x5 (in/in) Discharge Instruction: Apply Hydrofera Blue Ready to wound bed as directed AXCEL, HORSCH ([id number]) Secondary Dressing Zetuvit Plus Silicone Non-bordered 5x5 (in/in) Secured With Compression Wrap Medichoice 4 layer Compression System, 35-40 mmHG Discharge Instruction: Apply multi-layer wrap as directed. Compression Stockings Add-Ons Electronic Signature(s) Signed: 07/11/2021 1:59:49 PM By: Donnamarie Poag Entered ByDonnamarie Poag on 07/11/2021 08:21:16

## 2021-07-11 NOTE — Progress Notes (Signed)
BEN, HABERMANN ([id number]) Visit Report for 07/11/2021 Physician Orders Details Patient Name: [name], [name] Date of Service: 07/11/2021 8:00 AM Medical Record Number: [id number] Patient Account Number: [id number] Date of Birth/Sex: Jan 04, 1981 (41 y.o. M) Treating RN: Hansel Feinstein Primary Care Provider: [name] Other Clinician: Referring Provider: [name] Treating Provider/Extender: Tilda Franco in Treatment: [phone] Verbal / Phone Orders: No Diagnosis Coding Follow-up Appointments o Return Appointment in 1 week. o Nurse Visit as needed - twice a week Bathing/ Shower/ Hygiene o Clean wound with Normal Saline or wound cleanser. o May shower with wound dressing protected with water repellent cover or cast protector. Edema Control - Lymphedema / Segmental Compressive Device / Other o Optional: One layer of unna paste to top of compression wrap (to act as an anchor). o Elevate, Exercise Daily and Avoid Standing for Long Periods of Time. o Elevate legs to the level of the heart and pump ankles as often as possible o Elevate leg(s) parallel to the floor when sitting. Wound Treatment Wound #1 - Lower Leg Wound Laterality: Left, Lateral Cleanser: Wound Cleanser 3 x Per Week/30 Days Discharge Instructions: Wash your hands with soap and water. Remove old dressing, discard into plastic bag and place into trash. Cleanse the wound with Wound Cleanser prior to applying a clean dressing using gauze sponges, not tissues or cotton balls. Do not scrub or use excessive force. Pat dry using gauze sponges, not tissue or cotton balls. Peri-Wound Care: Moisturizing Lotion 3 x Per Week/30 Days Discharge Instructions: Suggestions: Theraderm, Eucerin, Cetaphil, or patient preference. Topical: Clobetasol Propionate ointment 0.05%, 60 (g) tube 3 x Per Week/30 Days Discharge Instructions: Pt to bring to appt- Topical: Triamcinolone Acetonide Cream, 0.1%, 15 (g) tube 3 x Per  Week/30 Days Discharge Instructions: Apply as directed by provider. To leg not on wound Primary Dressing: Hydrofera Blue Ready Transfer Foam, 4x5 (in/in) 3 x Per Week/30 Days Discharge Instructions: Apply Hydrofera Blue Ready to wound bed as directed Secondary Dressing: Zetuvit Plus Silicone Non-bordered 5x5 (in/in) 3 x Per Week/30 Days Compression Wrap: Medichoice 4 layer Compression System, 35-40 mmHG (Generic) 3 x Per Week/30 Days Discharge Instructions: Apply multi-layer wrap as directed. Electronic Signature(s) Signed: 07/11/2021 11:38:40 AM By: Geralyn Corwin DO Signed: 07/11/2021 1:59:49 PM By: Hansel Feinstein Entered By: Hansel Feinstein on 07/11/2021 08:22:35 [name] ([id number]) -------------------------------------------------------------------------------- SuperBill Details Patient Name: [name] Date of Service: 07/11/2021 Medical Record Number: [id number] Patient Account Number: [id number] Date of Birth/Sex: 09-23-1980 (40 y.o. 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

## 2021-07-12 ENCOUNTER — Other Ambulatory Visit: Payer: Self-pay | Admitting: Primary Care

## 2021-07-12 DIAGNOSIS — F3342 Major depressive disorder, recurrent, in full remission: Secondary | ICD-10-CM

## 2021-07-12 DIAGNOSIS — E785 Hyperlipidemia, unspecified: Secondary | ICD-10-CM

## 2021-07-13 ENCOUNTER — Other Ambulatory Visit: Payer: Self-pay

## 2021-07-13 DIAGNOSIS — I872 Venous insufficiency (chronic) (peripheral): Secondary | ICD-10-CM | POA: Diagnosis not present

## 2021-07-13 NOTE — Progress Notes (Signed)
DERAY, DAWES ([id number]) Visit Report for 07/13/2021 Physician Orders Details Patient Name: [name], [name] Date of Service: 07/13/2021 8:00 AM Medical Record Number: [id number] Patient Account Number: [id number] Date of Birth/Sex: 12-10-1980 (41 y.o. M) Treating RN: Donnamarie Poag Primary Care Provider: [name] Other Clinician: Referring Provider: [name] Treating Provider/Extender: [name] Cliche in Treatment: [phone] Verbal / Phone Orders: No Diagnosis Coding Follow-up Appointments o Return Appointment in 1 week. o Nurse Visit as needed - twice a week Bathing/ Shower/ Hygiene o Clean wound with Normal Saline or wound cleanser. o May shower with wound dressing protected with water repellent cover or cast protector. Edema Control - Lymphedema / Segmental Compressive Device / Other o Optional: One layer of unna paste to top of compression wrap (to act as an anchor). o Elevate, Exercise Daily and Avoid Standing for Long Periods of Time. o Elevate legs to the level of the heart and pump ankles as often as possible o Elevate leg(s) parallel to the floor when sitting. Wound Treatment Wound #1 - Lower Leg Wound Laterality: Left, Lateral Cleanser: Wound Cleanser 3 x Per Week/30 Days Discharge Instructions: Wash your hands with soap and water. Remove old dressing, discard into plastic bag and place into trash. Cleanse the wound with Wound Cleanser prior to applying a clean dressing using gauze sponges, not tissues or cotton balls. Do not scrub or use excessive force. Pat dry using gauze sponges, not tissue or cotton balls. Peri-Wound Care: Moisturizing Lotion 3 x Per Week/30 Days Discharge Instructions: Suggestions: Theraderm, Eucerin, Cetaphil, or patient preference. Topical: Clobetasol Propionate ointment 0.05%, 60 (g) tube 3 x Per Week/30 Days Discharge Instructions: Pt to bring to appt- Topical: Triamcinolone Acetonide Cream, 0.1%, 15 (g) tube 3 x Per Week/30  Days Discharge Instructions: Apply as directed by provider. To leg not on wound Primary Dressing: Hydrofera Blue Ready Transfer Foam, 4x5 (in/in) 3 x Per Week/30 Days Discharge Instructions: Apply Hydrofera Blue Ready to wound bed as directed Secondary Dressing: Zetuvit Plus Silicone Non-bordered 5x5 (in/in) 3 x Per Week/30 Days Compression Wrap: Medichoice 4 layer Compression System, 35-40 mmHG (Generic) 3 x Per Week/30 Days Discharge Instructions: Apply multi-layer wrap as directed. Electronic Signature(s) Signed: 07/13/2021 10:13:25 AM By: Donnamarie Poag Signed: 07/13/2021 4:05:24 PM By: Worthy Keeler PA-C Entered By: Donnamarie Poag on 07/13/2021 08:22:11 [name], [name] ([id number]) -------------------------------------------------------------------------------- SuperBill Details Patient Name: [name] Date of Service: 07/13/2021 Medical Record Number: [id number] Patient Account Number: [id number] Date of Birth/Sex: 11-25-1980 (41 y.o. 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

## 2021-07-13 NOTE — Progress Notes (Signed)
[name], [name] ([id number]) Visit Report for 07/13/2021 Arrival Information Details Patient Name: [name], [name] Date of Service: 07/13/2021 8:00 AM Medical Record Number: [id number] Patient Account Number: [id number] Date of Birth/Sex: 1980-07-17 (41 y.o. M) Treating RN: Donnamarie Poag Primary Care Branon Sabine: [name] Other Clinician: Referring Jesyka Slaght: [name] Treating Nyzier Boivin/Extender: [name] Cliche in Treatment: 240 Visit Information History Since Last Visit Added or deleted any medications: No Patient Arrived: Ambulatory Had a fall or experienced change in No Arrival Time: 08:01 activities of daily living that may affect Accompanied By: self risk of falls: Transfer Assistance: None Hospitalized since last visit: No Patient Identification Verified: Yes Has Dressing in Place as Prescribed: Yes Secondary Verification Process Completed: Yes Has Compression in Place as Prescribed: Yes Patient Requires Transmission-Based No Pain Present Now: No Precautions: Patient Has Alerts: Yes Patient Alerts: Patient has reaction to silver dressings. Electronic Signature(s) Signed: 07/13/2021 10:13:25 AM By: Donnamarie Poag Entered By: Donnamarie Poag on 07/13/2021 08:19:21 [name] ([id number]) -------------------------------------------------------------------------------- Compression Therapy Details Patient Name: [name] Date of Service: 07/13/2021 8:00 AM Medical Record Number: [id number] Patient Account Number: [id number] Date of Birth/Sex: 19-Feb-1981 (40 y.o. M) Treating RN: Donnamarie Poag Primary Care Mikiyah Glasner: [name] Other Clinician: Referring Janae Bonser: [name] Treating Camellia Popescu/Extender: [name] Cliche in Treatment: 240 Compression Therapy Performed for Wound Assessment: Wound #1 Left,Lateral Lower Leg Performed By: Junius Argyle, RN Compression Type: Four Layer Electronic Signature(s) Signed: 07/13/2021 10:13:25 AM By: Donnamarie Poag Entered By: Donnamarie Poag on 07/13/2021 08:21:11 [name] ([id number]) -------------------------------------------------------------------------------- Encounter Discharge Information Details Patient Name: [name] Date of Service: 07/13/2021 8:00 AM Medical Record Number: [id number] Patient Account Number: [id number] Date of Birth/Sex: 1980/07/27 (40 y.o. M) Treating RN: Donnamarie Poag Primary Care Bonham Zingale: [name] Other Clinician: Referring Astoria Condon: [name] Treating Zlatan Hornback/Extender: [name] Cliche in Treatment: 240 Encounter Discharge Information Items Discharge Condition: Stable Ambulatory Status: Ambulatory Discharge Destination: Home Transportation: Private Auto Accompanied By: self Schedule Follow-up Appointment: Yes Clinical Summary of Care: Electronic Signature(s) Signed: 07/13/2021 10:13:25 AM By: Donnamarie Poag Entered By: Donnamarie Poag on 07/13/2021 08:22:39 [name] ([id number]) -------------------------------------------------------------------------------- Wound Assessment Details Patient Name: [name] Date of Service: 07/13/2021 8:00 AM Medical Record Number: [id number] Patient Account Number: [id number] Date of Birth/Sex: 26-Jan-1981 (40 y.o. M) Treating RN: Donnamarie Poag Primary Care Myeasha Ballowe: [name] Other Clinician: Referring Brayten Komar: [name] Treating Ela Moffat/Extender: [name] Cliche in Treatment: 240 Wound Status Wound Number: 1 Primary Etiology: Pyoderma Wound Location: Left, Lateral Lower Leg Wound Status: Open Wounding Event: Gradually Appeared Comorbid History: Sleep Apnea, Hypertension, Colitis Date Acquired: 11/18/2015 Weeks Of Treatment: 240 Clustered Wound: No Wound Measurements Length: (cm) 2.2 Width: (cm) 1.5 Depth: (cm) 0.1 Area: (cm) 2.592 Volume: (cm) 0.259 % Reduction in Area: 47.2% % Reduction in Volume: 93.4% Epithelialization: Medium (34-66%) Wound Description Classification:  Full Thickness With Exposed Support Structures Wound Margin: Flat and Intact Exudate Amount: Medium Exudate Type: Serosanguineous Exudate Color: red, brown Foul Odor After Cleansing: No Slough/Fibrino Yes Wound Bed Granulation Amount: Large (67-100%) Exposed Structure Granulation Quality: Red, Pink Fascia Exposed: No Necrotic Amount: Small (1-33%) Fat Layer (Subcutaneous Tissue) Exposed: Yes Necrotic Quality: Adherent Slough Tendon Exposed: No Muscle Exposed: No Joint Exposed: No Bone Exposed: No Treatment Notes Wound #1 (Lower Leg) Wound Laterality: Left, Lateral Cleanser Wound Cleanser Discharge Instruction: Wash your hands with soap and water. Remove old dressing, discard into plastic bag and place into trash. Cleanse the wound with Wound Cleanser prior to applying a clean dressing using gauze sponges,  not tissues or cotton balls. Do not scrub or use excessive force. Pat dry using gauze sponges, not tissue or cotton balls. Peri-Wound Care Moisturizing Lotion Discharge Instruction: Suggestions: Theraderm, Eucerin, Cetaphil, or patient preference. Topical Clobetasol Propionate ointment 0.05%, 60 (g) tube Discharge Instruction: Pt to bring to appt- Triamcinolone Acetonide Cream, 0.1%, 15 (g) tube Discharge Instruction: Apply as directed by Novalyn Lajara. To leg not on wound Primary Dressing Hydrofera Blue Ready Transfer Foam, 4x5 (in/in) Discharge Instruction: Apply Hydrofera Blue Ready to wound bed as directed HAMISH, BANKS ([id number]) Secondary Dressing Zetuvit Plus Silicone Non-bordered 5x5 (in/in) Secured With Compression Wrap Medichoice 4 layer Compression System, 35-40 mmHG Discharge Instruction: Apply multi-layer wrap as directed. Compression Stockings Add-Ons Electronic Signature(s) Signed: 07/13/2021 10:13:25 AM By: Donnamarie Poag Entered ByDonnamarie Poag on 07/13/2021 08:20:57

## 2021-07-16 ENCOUNTER — Encounter: Payer: 59 | Admitting: Physician Assistant

## 2021-07-16 ENCOUNTER — Other Ambulatory Visit: Payer: Self-pay

## 2021-07-16 DIAGNOSIS — I872 Venous insufficiency (chronic) (peripheral): Secondary | ICD-10-CM | POA: Diagnosis not present

## 2021-07-17 NOTE — Progress Notes (Signed)
JOESEPH, [name] ([id number]) Visit Report for 07/16/2021 Chief Complaint Document Details Patient Name: [name], [name] Date of Service: 07/16/2021 8:15 AM Medical Record Number: [id number] Patient Account Number: [id number] Date of Birth/Sex: 02-08-1981 (41 y.o. 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 M) Treating RN: Levora Dredge Primary Care Provider: [name] Other Clinician: Referring Provider: [name] Treating Provider/Extender: [name] Cliche in Treatment: 240 History of Present Illness HPI Description: 12/04/16; 41 year old man who comes into the clinic today for review of a wound on the posterior left calf. He tells me that is been there for about a year. He is not a diabetic he does smoke half a pack per day. He was seen in the ER on 11/20/16 felt to have cellulitis around the wound and was given clindamycin. An x-ray did not show osteomyelitis. The patient initially tells me that he has a milk allergy that sets off a pruritic itching rash on his lower legs which she scratches incessantly and he thinks that's what may have set up the wound. He has been using various topical antibiotics and ointments without any effect. He works in a [employer] and is on his feet all day. He does not have a  prior history of wounds however he does have the rash on both lower legs the right arm and the ventral aspect of his left arm. These are excoriations and clearly have had scratching however there are of macular looking areas on both legs including a substantial larger area on the right leg. This does not have an underlying open area. There is no blistering. The patient tells me that 2 years ago in [place] in response to the rash on his legs he saw a dermatologist who told him he had a condition which may be pyoderma gangrenosum although I may be putting words into his mouth. He seemed to recognize this. On further questioning he admits to a 5 year history of quiesced. ulcerative colitis. He is not in any treatment for this. He's had no recent travel 12/11/16; the patient arrives today with his wound and roughly the same condition we've been using silver alginate this is a deep punched out wound with some surrounding erythema but no tenderness. Biopsy I did did not show confirmed pyoderma gangrenosum suggested nonspecific inflammation and vasculitis but does not provide an actual description of what was seen by the pathologist. I'm really not able to understand this We have also received information from the patient's dermatologist in [place] notes from April 2016. This was a doctor Agarwal-antal. 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 He was prescribed topical steroid high potency under occlusion which helped but at this point the patient did not have a deep punched out wound. 12/18/16; the patient's wound is larger in terms of surface area however this surface looks better and there is less depth. The surrounding erythema also is better. The patient states that the wrap we put on came off 2 days ago when he has been using his compression stockings.  He we are in the process of getting a dermatology consult. 12/26/16 on evaluation today patient's left lower extremity wound shows evidence of  infection with surrounding erythema noted. He has been tolerating the dressing changes but states that he has noted more discomfort. There is a larger area of erythema surrounding the wound. No fevers, chills, nausea, or vomiting noted at this time. With that being said the wound still does have slough covering the surface. He is not allergic to any medication that he is aware of at this point. In regard to his right lower extremity he had several regions that are erythematous and pruritic he wonders if there's anything we can do to help that. 01/02/17 I reviewed patient's wound culture which was obtained his visit last week. He was placed on doxycycline at that point. Unfortunately that does not appear to be an antibiotic that would likely help with the situation however the pseudomonas noted on culture is sensitive to Cipro. Also unfortunately patient's wound seems to have a large compared to last week's evaluation. Not severely so but there are definitely increased measurements in general. He is continuing to have discomfort as well he writes this to be a seven out of 10. In fact he would prefer me not to perform any debridement today due to the fact that he is having discomfort and considering he has an active infection on the little reluctant to do so anyway. No fevers, chills, nausea, or vomiting noted at this time. 01/08/17; patient seems dermatology on September 5. I suspect dermatology will want the slides from the biopsy I did sent to their pathologist. I'm not sure if there is a way we can expedite that. In any case the culture I did before I left on vacation 3 weeks ago showed Pseudomonas he was given 10 days of Cipro and per her description of her intake nurses is actually somewhat better this week although the wound is quite a bit bigger than I remember the last time I saw this. He still has 3 more days of Cipro 01/21/17; dermatology appointment tomorrow. He has completed the ciprofloxacin  for Pseudomonas. Surface of the wound looks better however he is had some deterioration in the lesions on his right leg. Meantime the left lateral leg wound we will continue with sample 01/29/17; patient had his dermatology appointment but I can't yet see that note. He is completed his antibiotics. The wound is more superficial but considerably larger in circumferential area than when he came in. This is in his left lateral calf. He also has swollen erythematous areas with superficial wounds on the right leg and small papular areas on both arms. There apparently areas in her his upper thighs and buttocks I did not look at those. Dermatology biopsied the right leg. Hopefully will have their input next week. 02/05/17; patient went back to see his dermatologist who told him that he had a "scratching problem" as well as staph. He is now on a 30 day course of doxycycline and I believe she gave him triamcinolone cream to the right leg areas to help with the itching [not exactly sure but probably triamcinolone]. She apparently looked at the left lateral leg wound although this was not rebiopsied and I think felt to be ultimately part of the same pathogenesis. He is using sample border foam and changing nevus himself. He now has a new open area on the right posterior leg which was his biopsy site I don't have any of the dermatology notes  02/12/17; we put the patient in compression last week with SANTYL to the wound on the left leg and the biopsy. Edema is much better and the depth of the wound is now at level of skin. Area is still the same oBiopsy site on the right lateral leg we've also been using santyl with a border foam dressing and he is changing this himself. 02/19/17; Using silver alginate started last week to both the substantial left leg wound and the biopsy site on the right wound. He is tolerating compression well. Has a an appointment with his primary M.D. tomorrow wondering about diuretics although  I'm wondering if the edema problem is actually lymphedema 02/26/17; the patient has been to see his primary doctor Dr. Jerrel Ivory at [place] our primary care. She started him on Lasix 20 mg and this seems to have helped with the edema. However we are not making substantial change with the left lateral calf wound and inflammation. The biopsy site on the right leg also looks stable but not really all that different. 03/12/17; the patient has been to see vein and vascular Dr. Lucky Cowboy. He has had venous reflux studies I have not reviewed these. I did get a call from his dermatology office. They felt that he might have pathergy based on their biopsy on his right leg which led them to look at the slides of [name], [name] ([id number]) the biopsy I did on the left leg and they wonder whether this represents pyoderma gangrenosum which was the original supposition in a man with ulcerative colitis albeit inactive for many years. They therefore recommended clobetasol and tetracycline i.e. aggressive treatment for possible pyoderma gangrenosum. 03/26/17; apparently the patient just had reflux studies not an appointment with Dr. dew. She arrives in clinic today having applied clobetasol for 2-3 weeks. He notes over the last 2-3 days excessive drainage having to change the dressing 3-4 times a day and also expanding erythema. 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 Lillia Carmel at [facility] dermatology locally who kindly saw him at my request. 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 My concern was the degree of erythema medially extending from his left leg wound which was either inflammation from pyoderma  or cellulitis. I put him on Augmentin however culture of the wound showed Pseudomonas which is quinolone sensitive. I really don't believe he has cellulitis however in view of everything I will continue and give him a course of Cipro. He is also on doxycycline as an immune modulator for the pyoderma. In addition to his original wound on the left lateral leg with surrounding erythema he has a wound on the right posterior calf which was an original biopsy site done by dermatology. This was felt to represent pathergy from pyoderma gangrenosum 04/16/17; pyoderma gangrenosum. Saw Dr. Lillia Carmel yesterday. He has been using topical antibiotics to both wound areas his original wound on the left and the biopsies/pathergy area on the right. There is definitely some improvement in the inflammation around the wound on the right although the patient states he has increasing sensitivity of the wounds. He is on prednisone 60 and doxycycline 1 as prescribed by Dr. Lillia Carmel. He is covering the topical antibiotic with gauze  and putting this in his own compression stocks and changing this daily. He states that Dr. Lottie Rater did a culture of the left leg wound yesterday 05/07/17; pyoderma gangrenosum. The patient saw Dr. Lillia Carmel yesterday and has a follow-up with her in one month. He is still using topical antibiotics to both wounds although he can't recall exactly what type. He is still on prednisone 60 mg. Dr. Lillia Carmel stated that the doxycycline could stop if we were in agreement. 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 The right medial leg was induced by biopsy/pathergy. The area on the right is essentially healed. Still on high-dose prednisone using topical antibiotics to the wound 07/09/17; pyoderma gangrenosum with wounds on the left lateral leg. The right medial leg has closed and remains closed. He  is still on prednisone 60. oHe tells me he missed his last dermatology appointment with Dr. Lillia Carmel but will make another appointment. He reports that her blood sugar at a recent screen in [place] was high 200's. He was 180 today. 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 I think he is going to require still much longer prednisone perhaps another 3 months before attempting to taper. In the meantime his wound is a lot better. Smaller. He is cleaning this off daily and applying topical antibiotics. When he was last in the clinic I thought about changing to [facility] and actually put in a couple of calls to dermatology although probably not during their business hours. 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 He continues on oral prednisone. He has been using triple antibiotic ointment. There is surface debris and we will transition to [facility] and have him return in 2 weeks. He has lost 30 pounds since his last appointment with lifestyle modification. He may benefit from topical steroid cream for treatment this can be considered at a later date. 08/22/17 on evaluation today patient appears to actually be doing rather well in regard to his left lateral lower extremity ulcer. He has actually been managed by Dr. Dellia Nims most recently. Patient is currently on oral steroids at this time. This seems to have been of benefit for him. Nonetheless his last visit was actually with Leah on 08/06/17. Currently he is not utilizing any topical steroid creams although this could be of benefit as well. No fevers, chills, nausea, or vomiting noted at this time. 09/05/17 on evaluation today patient appears to be doing better in regard to his left lateral lower extremity ulcer. He has been tolerating the dressing changes without complication. He is using Santyl with good effect. Overall I'm  very pleased with how things are standing at this point. Patient likewise is happy that this is doing better. 09/19/17 on evaluation today patient actually appears to be doing rather well in regard to his left lateral lower extremity ulcer. Again this is secondary to Pyoderma gangrenosum and he seems to be progressing well with the Santyl which is good news. He's not having any significant pain. 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 He has been tolerating the Santyl without complication and in general I feel like he's making good progress. 10/17/17 on evaluation today patient  appears to be doing very well in regard to his left lateral lower surety ulcer. He has been tolerating the dressing changes without complication. There does not appear to be any evidence of infection he's alternating the Santyl and the triple antibiotic ointment every other day this seems to be doing well for him. 11/03/17 on evaluation today patient appears to be doing very well in regard to his left lateral lower extremity ulcer. He is been tolerating the dressing changes without complication which is good news. Fortunately there does not appear to be any evidence of infection which is also great news. Overall is doing excellent they are starting to taper down on the prednisone is down to 40 mg at this point it also started topical clobetasol for him. 11/17/17 on evaluation today patient appears to be doing well in regard to his left lateral lower surety ulcer. He's been tolerating the dressing changes without complication. He does note that he is having no pain, no excessive drainage or discharge, and overall he feels like things are going about how he would expect and hope they would. Overall he seems to have no evidence of infection at this time in my opinion which is good news. 12/04/17-He is seen in follow-up  evaluation for right lateral lower extremity ulcer. He has been applying topical steroid cream. Today's measurement show slight increase in size. Over the next 2 weeks we will transition to every other day Santyl and steroid cream. He has been encouraged to monitor for changes and notify clinic with any concerns 12/15/17 on evaluation today patient's left lateral motion the ulcer and fortunately is doing worse again at this point. This just since last week to this week has close to doubled in size according to the patient. I did not seeing last week's I do not have a visual to compare this to in our system was also down so we do not have all the charts and at this point. Nonetheless it does have me somewhat concerned in regard to the fact that again he was worried enough about it he has contact the dermatology that placed them back on the full strength, 50 mg a day of the prednisone that he was taken previous. He continues to alternate using clobetasol along with Santyl at this point. He is obviously somewhat frustrated. 12/22/17 on evaluation today patient appears to be doing a little worse compared to last evaluation. Unfortunately the wound is a little deeper and slightly larger than the last week's evaluation. With that being said he has made some progress in regard to the irritation surrounding at this time unfortunately despite that progress that's been made he still has a significant issue going on here. 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 [name], [name] ([id number]) For that reason I discussed with him today the possibility of perform a wound culture to ensure there's nothing overtly infected. 01/06/18 on evaluation today patient's wound is larger and deeper than previously evaluated. With that being said it did appear that his wound was  infected after my last evaluation with him. Subsequently I did end up prescribing a prescription for Bactrim DS which she has been taking and having no complication with. Fortunately there does not appear to be any evidence of infection at this point in time as far as anything spreading, no want to touch, and overall I feel like  things are showing signs of improvement. 01/13/18 on evaluation today patient appears to be even a little larger and deeper than last time. There still muscle exposed in the base of the wound. Nonetheless he does appear to be less erythematous I do believe inflammation is calming down also believe the infection looks like it's probably resolved at this time based on what I'm seeing. No fevers, chills, nausea, or vomiting noted at this time. 01/30/18 on evaluation today patient actually appears to visually look better for the most part. Unfortunately those visually this looks better he does seem to potentially have what may be an abscess in the muscle that has been noted in the central portion of the wound. This is the first time that I have noted what appears to be fluctuance in the central portion of the muscle. With that being said I'm somewhat more concerned about the fact that this might indicate an abscess formation at this location. I do believe that an ultrasound would be appropriate. This is likely something we need to try to do as soon as possible. He has been switch to mupirocin ointment and he is no longer using the steroid ointment as prescribed by dermatology he sees them again next week he's been decreased from 60 to 40 mg of prednisone. 03/09/18 on evaluation today patient actually appears to be doing a little better compared to last time I saw him. There's not as much erythema surrounding the wound itself. He I did review his most recent infectious disease note which was dated 02/24/18. He saw Dr. Michel Bickers in [place]. 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 Patient is now off of antibiotics and they are continually observing this. There seems to be no change in the past two weeks in my pinion based on what the patient says and what I see today compared to what Dr. Megan Salon likely saw two weeks ago. No fevers, chills, nausea, or vomiting noted at this time. 03/23/18 on evaluation today patient's wound actually appears to be showing signs of improvement which is good news. He is currently still on the Dapsone. He is also working on tapering the prednisone to get off of this and Dr. Lottie Rater is working with him in this regard. Nonetheless overall I feel like the wound is doing well it does appear based on the infectious disease note that I reviewed from Dr. 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 I did also review the results of his ultrasound of left lower extremity which revealed there was a dentist tissue in the base of the wound without an abscess noted. 04/06/18 on evaluation today the patient's left lateral lower extremity ulcer actually appears to be doing fairly well which is excellent news. There does not appear to be any evidence of infection at this time which is also great news. Overall he still does have a significantly large ulceration although little by little he seems to be making progress. He is down to 10 mg a day of the prednisone. 04/20/18 on evaluation today patient actually appears to be doing excellent at this time in regard to his left lower extremity ulcer. He's making signs of good progress unfortunately this is taking much longer than we would really like to see but nonetheless he is  making progress. Fortunately there does not appear to be any evidence of infection at this time. No fevers, chills, nausea, or vomiting noted at  this time. The patient has not been using the Santyl due to the cost he hadn't got in this field yet. He's mainly been using the antibiotic ointment topically. Subsequently he also tells me that he really has not been scrubbing in the shower I think this would be helpful again as I told him it doesn't have to be anything too aggressive to even make it believe just enough to keep it free of some of the loose slough and biofilm on the wound surface. 05/11/18 on evaluation today patient's wound appears to be making slow but sure progress in regard to the left lateral lower extremity ulcer. He is been tolerating the dressing changes without complication. Fortunately there does not appear to be any evidence of infection at this time. He is still just using triple antibiotic ointment along with clobetasol occasionally over the area. He never got the Santyl and really does not seem to intend to in my pinion. 06/01/18 on evaluation today patient appears to be doing a little better in regard to his left lateral lower extremity ulcer. He states that overall he does not feel like he is doing as well with the Dapsone as he did with the prednisone. Nonetheless he sees his dermatologist later today and is gonna talk to them about the possibility of going back on the prednisone. Overall again I believe that the wound would be better if you would utilize Santyl but he really does not seem to be interested in going back to the [place] at this point. He has been using triple antibiotic ointment. 06/15/18 on evaluation today patient's wound actually appears to be doing about the same at this point. Fortunately there is no signs of infection at this time. He has made slight improvements although he continues to not really want to clean the wound bed at this point. He states that he just doesn't mess with it he doesn't want to cause any problems with everything else he has going on. He has been on medication, antibiotics  as prescribed by his dermatologist, for a staff infection of his lower extremities which is really drying out now and looking much better he tells me. Fortunately there is no sign of overall infection. 06/29/18 on evaluation today patient appears to be doing well in regard to his left lateral lower surety ulcer all things considering. Fortunately his staff infection seems to be greatly improved compared to previous. He has no signs of infection and this is drying up quite nicely. He is still the doxycycline for this is no longer on cental, Dapsone, or any of the other medications. His dermatologist has recommended possibility of an infusion but right now he does not want to proceed with that. 07/13/18 on evaluation today patient appears to be doing about the same in regard to his left lateral lower surety ulcer. Fortunately there's no signs of infection at this time which is great news. Unfortunately he still builds up a significant amount of Slough/biofilm of the surface of the wound he still is not really cleaning this as he should be appropriately. Again I'm able to easily with saline and gauze remove the majority of this on the surface which if you would do this at home would likely be a dramatic improvement for him as far as getting the area to improve. Nonetheless overall I still feel like he  is making progress is just very slow. I think Santyl will be of benefit for him as well. Still he has not gotten this as of this point. 07/27/18 on evaluation today patient actually appears to be doing little worse in regards of the erythema around the periwound region of the wound he also tells me that he's been having more drainage currently compared to what he was experiencing last time I saw him. He states not quite as bad as what he had because this was infected previously but nonetheless is still appears to be doing poorly. Fortunately there is no evidence of systemic infection at this point. The patient  tells me that he is not going to be able to afford the Santyl. He is still waiting to hear about the infusion therapy with his dermatologist. Apparently she wants an updated colonoscopy first. 08/10/18 on evaluation today patient appears to be doing better in regard to his left lateral lower extremity ulcer. Fortunately he is showing signs of improvement in this regard he's actually been approved for Remicade infusion's as well although this has not been scheduled as of yet. Fortunately there's no signs of active infection at this time in regard to the wound although he is having some issues with infection of the right lower extremity is been seen as dermatologist for this. Fortunately they are definitely still working with him trying to keep things under control. [name], [name] ([id number]) 09/07/18 on evaluation today patient is actually doing rather well in regard to his left lateral lower extremity ulcer. He notes these actually having some hair grow back on his extremity which is something he has not seen in years. He also tells me that the pain is really not giving them any trouble at this time which is also good news overall she is very pleased with the progress he's using a combination of the mupirocin along with the probate is all mixed. 09/21/18 on evaluation today patient actually appears to be doing fairly well all things considered in regard to his looks from the ulcer. He's been tolerating the dressing changes without complication. Fortunately there's no signs of active infection at this time which is good news he is still on all antibiotics or prevention of the staff infection. He has been on prednisone for time although he states it is gonna contact his dermatologist and see if she put them on a short course due to some irritation that he has going on currently. Fortunately there's no evidence of any overall worsening this is going very slow I think cental would be something that would be  helpful for him although he states that $50 for tube is quite expensive. He therefore is not willing to get that at this point. 10/06/18 on evaluation today patient actually appears to be doing decently well in regard to his left lateral leg ulcer. He's been tolerating the dressing changes without complication. Fortunately there's no signs of active infection at this time. Overall I'm actually rather pleased with the progress he's making although it's slow he doesn't show any signs of infection and he does seem to be making some improvement. I do believe that he may need a switch up and dressings to try to help this to heal more appropriately and quickly. 10/19/18 on evaluation today patient actually appears to be doing better in regard to his left lateral lower extremity ulcer. This is shown signs of having much less Slough buildup at this point due to the fact he has been using  the Santyl. Obviously this is very good news. The overall size of the wound is not dramatically smaller but again the appearance is. 11/02/18 on evaluation today patient actually appears to be doing quite well in regard to his lower [place] ulcer. A lot of the skin around the ulcer is actually somewhat irritating at this point this seems to be more due to the dressing causing irritation from the adhesive that anything else. Fortunately there is no signs of active infection at this time. 11/24/18 on evaluation today patient appears to be doing a little worse in regard to his overall appearance of his lower extremity ulcer. There's more erythema and warmth around the wound unfortunately. He is currently on doxycycline which he has been on for some time. With that being said I'm not sure that seems to be helping with what appears to possibly be an acute cellulitis with regard to his left lower extremity ulcer. No fevers, chills, nausea, or vomiting noted at this time. 12/08/18 on evaluation today patient's wounds actually appears to  be doing significantly better compared to his last evaluation. He has been using Santyl along with alternating tripling about appointment as well as the steroid cream seems to be doing quite well and the wound is showing signs of improvement which is excellent news. Fortunately there's no evidence of infection and in fact his culture came back negative with only normal skin flora noted. 12/21/2018 upon evaluation today patient actually appears to be doing excellent with regard to his ulcer. This is actually the best that I have seen it since have been helping to take care of him. It is both smaller as well as less slough noted on the surface of the wound and seems to be showing signs of good improvement with new skin growing from the edges. He has been using just the triamcinolone he does wonder if he can get a refill of that ointment today. 01/04/2019 upon evaluation today patient actually appears to be doing well with regard to his left lateral lower extremity ulcer. With that being said it does not appear to be that he is doing quite as well as last time as far as progression is concerned. There does not appear to be any signs of infection or significant irritation which is good news. With that being said I do believe that he may benefit from switching to a collagen based dressing based on how clean The wound appears. 01/18/2019 on evaluation today patient actually appears to be doing well with regard to his wound on the left lower extremity. He is not made a lot of progress compared to where we were previous but nonetheless does seem to be doing okay at this time which is good news. There is no signs of active infection which is also good news. My only concern currently is I do wish we can get him into utilizing the collagen dressing his insurance would not pay for the supplies that we ordered although it appears that he may be able to order this through his supply company that he typically utilizes.  This is [employer]. Nonetheless he did try to order it during the office visit today and it appears this did go through. We will see if he can get that it is a different brand but nonetheless he has collagen and I do think will be beneficial. 02/01/2019 on evaluation today patient actually appears to be doing a little worse today in regard to the overall size of his wounds. Fortunately there  is no signs of active infection at this time. That is visually. Nonetheless when this is happened before it was due to infection. For that reason were somewhat concerned about that this time as well. 02/08/2019 on evaluation today patient unfortunately appears to be doing slightly worse with regard to his wound upon evaluation today. Is measuring a little deeper and a little larger unfortunately. I am not really sure exactly what is causing this to enlarge he actually did see his dermatologist she is going to see about initiating Humira for him. Subsequently she also did do steroid injections into the wound itself in the periphery. Nonetheless still nonetheless he seems to be getting a little bit larger he is gone back to just using the steroid cream topically which I think is appropriate. I would say hold off on the collagen for the time being is definitely a good thing to do. Based on the culture results which we finally did get the final result back regarding it shows staph as the bacteria noted again that can be a normal skin bacteria based on the fact however he is having increased drainage and worsening of the wound measurement wise I would go ahead and place him on an antibiotic today I do believe for this. 02/15/2019 on evaluation today patient actually appears to be doing somewhat better in regard to his ulcer. There is no signs of worsening at this time I did review his culture results which showed evidence of Staphylococcus aureus but not MRSA. Again this could just be more related to the normal skin  bacteria although he states the drainage has slowed down quite a bit he may have had a mild infection not just colonization. And was much smaller and then since around10/04/2019 on evaluation today patient appears to be doing unfortunately worse as far as the size of the wound. I really feel like that this is steadily getting larger again it had been doing excellent right at the beginning of September we have seen a steady increase in the area of the wound it is almost 2-1/2 times the size it was on September 1. Obviously this is a bad trend this is not wanting to see. For that reason we went back to using just the topical triamcinolone cream which does seem to help with inflammation. I checked him for bacteria by way of culture and nothing showed positive there. I am considering giving him a short course of a tapering steroid Dosepak today to see if that is can be beneficial for him. The patient is in agreement with giving that a try. 03/08/2019 on evaluation today patient appears to be doing very well in comparison to last evaluation with regard to his lower extremity ulcer. This is showing signs of less inflammation and actually measuring slightly smaller compared to last time every other week over the past month and a half he has been measuring larger larger larger. Nonetheless I do believe that the issue has been inflammation the prednisone does seem to [facility], Hodari ([id number]) have been beneficial for him which is good news. No fevers, chills, nausea, vomiting, or diarrhea. 03/22/2019 on evaluation today patient appears to be doing about the same with regard to his leg ulcer. He has been tolerating the dressing changes without complication. With that being said the wound seems to be mostly arrested at its current size but really is not making any progress except for when we prescribed the prednisone. He did show some signs of dropping as far as  the overall size of the wound during that interval  week. Nonetheless this is something he is not on long-term at this point and unfortunately I think he is getting need either this or else the Humira which his dermatologist has discussed try to get approval for. With that being said he will be seeing his dermatologist on the 11th of this month that is November. 04/19/2019 on evaluation today patient appears to be doing really about the same the wound is measuring slightly larger compared to last time I saw him. He has not been into the office since November 2 due to the fact that he unfortunately had Covid as that his entire family. He tells me that it was rough but they did pull-through and he seems to be doing much better. Fortunately there is no signs of active infection at this time. No fevers, chills, nausea, vomiting, or diarrhea. 05/10/2019 on evaluation today patient unfortunately appears to be doing significantly worse as compared to last time I saw him. He does tell me that he has had his first dose of Humira and actually is scheduled to get the next one in the upcoming week. With that being said he tells me also that in the past several days he has been having a lot of issues with green drainage she showed me a picture this is more blue-green in color. He is also been having issues with increased sloughy buildup and the wound does appear to be larger today. Obviously this is not the direction that we want everything to take based on the starting of his Humira. Nonetheless I think this is definitely a result of likely infection and to be honest I think this is probably Pseudomonas causing the infection based on what I am seeing. 05/24/2019 on evaluation today patient unfortunately appears to be doing significantly worse compared to his prior evaluation with me 2 weeks ago. I did review his culture results which showed that he does have Staph aureus as well as Pseudomonas noted on the culture. Nonetheless the Levaquin that I prescribed for him  does not appear to have been appropriate and in fact he tells me he is no longer experiencing the green drainage and discharge that he had at the last visit. Fortunately there is no signs of active infection at this time which is good news although the wound has significantly worsened it in fact is much deeper than it was previous. We have been utilizing up to this point triamcinolone ointment as the prescription topical of choice but at this time I really feel like that the wound is getting need to be packed in order to appropriately manage this due to the deeper nature of the wound. Therefore something along the lines of an alginate dressing may be more appropriate. 05/31/2019 upon inspection today patient's wound actually showed signs of doing poorly at this point. Unfortunately he just does not seem to be making any good progress despite what we have tried. He actually did go ahead and pick up the Cipro and start taking that as he was noticing more green drainage he had previously completed the Levaquin that I prescribed for him as well. Nonetheless he missed his appointment for the seventh last week on Wednesday with the wound care center and [facility] where his dermatologist referred him. Obviously I do think a second opinion would be helpful at this point especially in light of the fact that the patient seems to be doing so poorly despite the fact  that we have tried everything that I really know how at this point. The only thing that ever seems to have helped him in the past is when he was on high doses of continual steroids that did seem to make a difference for him. 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 06/07/2019 upon evaluation today patient unfortunately appears to be doing worse yet again with regard to his wound. In fact I  am starting to question whether or not he may have a fluid pocket in the muscle at this point based on the bulging and the soft appearance to the central portion of the muscle area. There is not anything draining from the muscle itself at this time which is good news but nonetheless the wound is expanding. I am not really seeing any results of the Humira as far as overall wound progression based on what I am seeing at this point. The patient has been referred for second opinion with regard to his wound to the [facility] wound care center by his dermatologist which I definitely am not in opposition to. Unfortunately we tried multiple dressings in the past including collagen, alginate, and at one point even Hydrofera Blue. With that being said he is never really used it for any significant amount of time due to the fact that he often complains of pain associated with these dressings and then will go back to either using the Santyl which she has done intermittently or more frequently the triamcinolone. He is also using his own compression stockings. We have wrapped him in the past but again that was something else that he really was not a big fan of. 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 He seemed to do much better when he was on the steroids although honestly I understand that the steroids are not necessarily the best medication to be on long-term obviously 06/14/2019 on evaluation today patient appears to be doing actually a little bit better with regard to the overall appearance with his leg. Unfortunately he does continue to have issues with what appears to be some fluid underneath  the muscle although he did see the wound specialty center at [facility] last week their main goals were to see about infusion therapy in place of the Humira as they feel like that is not quite strong enough. They also recommended that we continue with the treatment otherwise as we are they felt like that was appropriate and they are okay with him continuing to follow-up here with [place] in that regard. With that being said they are also sending him to the vein specialist there to see about vein stripping and if that would be of benefit for him. Subsequently they also did not really address whether or not an ultrasound of the muscle area to see if there is anything that needs to be addressed here would be appropriate or not. For that reason I discussed this with him last week I think we  may proceed down that road at this point. 06/21/2019 upon evaluation today patient's wound actually appears to be doing slightly better compared to previous evaluations. I do believe that he has made a difference with regard to the progression here with the use of oral steroids. Again in the past has been the only thing that is really calm things down. He does tell me that from [facility] is gotten a good news from there that there are no further vein stripping that is necessary at this point. I do not have that available for review today although the patient did relay this to me. He also did obtain and have the ultrasound of the wound completed which I did sign off on today. It does appear that there is no fluid collection under the muscle this is likely then just edematous tissue in general. That is also good news. Overall I still believe the inflammation is the main issue here. He did inquire about the possibility of a wound VAC again with the muscle protruding like it is I am not really sure whether the wound VAC is necessarily ideal or not. That is something we will have to consider although I do believe he may need compression  wrapping to try to help with edema control which could potentially be of benefit. 06/28/2019 on evaluation today patient appears to be doing slightly better measurement wise although this is not terribly smaller he least seems to be trending towards that direction. With that being said he still seems to have purulent drainage noted in the wound bed at this time. He has been on Levaquin followed by Cipro over the past month. Unfortunately he still seems to have some issues with active infection at this time. I did perform a culture last week in order to evaluate and see if indeed there was still anything going on. Subsequently the culture did come back showing Pseudomonas which is consistent with the drainage has been having which is blue-green in color. He also has had an odor that again was somewhat consistent with Pseudomonas as well. Long story short it appears that the culture showed an intermediate finding with regard to how well the Cipro will work for the Pseudomonas infection. Subsequently being that he does not seem to be clearing up and at best what we are doing is just keeping this at [place] I think he may need to see infectious disease to discuss IV antibiotic options. GRANT, SWAGER ([id number]) 07/05/2019 upon evaluation today patient appears to be doing okay in regard to his leg ulcer. He has been tolerating the dressing changes at this point without complication. Fortunately there is no signs of active infection at this time which is good news. No fevers, chills, nausea, vomiting, or diarrhea. With that being said he does have an appointment with infectious disease tomorrow and his primary care on Wednesday. Again the reason for the infectious disease referral was due to the fact that he did not seem to be fully resolving with the use of oral antibiotics and therefore we were thinking that IV antibiotic therapy may be necessary secondary to the fact that there was an intermediate finding for  how effective the Cipro may be. Nonetheless again he has been having a lot of purulent and even green drainage. Fortunately right now that seems to have calmed down over the past week with the reinitiation of the oral antibiotic. Nonetheless we will see what Dr. Megan Salon has to say. 07/12/2019 upon evaluation today patient appears to be  doing about the same at this point in regard to his left lower extremity ulcer. Fortunately there is no signs of active infection at this time which is good news I do believe the Levaquin has been beneficial I did review Dr. Hale Bogus note and to be honest I agree that the patient's leg does appear to be doing better currently. What we found in the past as he does not seem to really completely resolve he will stop the antibiotic and then subsequently things will revert back to having issues with blue-green drainage, increased pain, and overall worsening in general. Obviously that is the reason I sent him back to infectious disease. 07/19/2019 upon evaluation today patient appears to be doing roughly the same in size there is really no dramatic improvement. He has started back on the Levaquin at this point and though he seems to be doing okay he did still have a lot of blue/green drainage noted on evaluation today unfortunately. I think that this is still indicative more likely of a Pseudomonas infection as previously noted and again he does see Dr. Megan Salon in just a couple of days. I do not know that were really able to effectively clear this with just oral antibiotics alone based on what I am seeing currently. Nonetheless we are still continue to try to manage as best we can with regard to the patient and his wound. I do think the wrap was helpful in decreasing the edema which is excellent news. No fevers, chills, nausea, vomiting, or diarrhea. 07/26/2019 upon evaluation today patient appears to be doing slightly better with regard to the overall appearance of the muscle  there is no dark discoloration centrally. Fortunately there is no signs of active infection at this time. No fevers, chills, nausea, vomiting, or diarrhea. Patient's wound bed currently the patient did have an appointment with Dr. Megan Salon at infectious disease last week. With that being said Dr. Megan Salon the patient states was still somewhat hesitant about put him on any IV antibiotics he wanted [place] to repeat cultures today and then see where things go going forward. He does look like Dr. Megan Salon because of some improvement the patient did have with the Levaquin wanted [place] to see about repeating cultures. If it indeed grows the Pseudomonas again then he recommended a possibility of considering a PICC line placement and IV antibiotic therapy. He plans to see the patient back in 1 to 2 weeks. 08/02/2019 upon evaluation today patient appears to be doing poorly with regard to his left lower extremity. We did get the results of his culture back it shows that he is still showing evidence of Pseudomonas which is consistent with the purulent/blue-green drainage that he has currently. Subsequently the culture also shows that he now is showing resistance to the oral fluoroquinolones which is unfortunate as that was really the only thing to treat the infection prior. I do believe that he is looking like this is going require IV antibiotic therapy to get this under control. Fortunately there is no signs of systemic infection at this time which is good news. The patient does see Dr. Megan Salon tomorrow. 08/09/2019 upon evaluation today patient appears to be doing better with regard to his left lower extremity ulcer in regard to the overall appearance. He is currently on IV antibiotic therapy. As ordered by Dr. Megan Salon. Currently the patient is on ceftazidime which she is going to take for the next 2 weeks and then follow-up for 4 to 5-week appointment with Dr. Megan Salon.  The patient started this this past Friday  symptoms have not for a total of 3 days currently in full. 08/16/2019 upon evaluation today patient's wound actually does show muscle in the base of the wound but in general does appear to be much better as far as the overall evidence of infection is concerned. In fact I feel like this is for the most part cleared up he still on the IV antibiotics he has not completed the full course yet but I think he is doing much better which is excellent news. 08/23/2019 upon evaluation today patient appears to be doing about the same with regard to his wound at this point. He tells me that he still has pain unfortunately. Fortunately there is no evidence of systemic infection at this time which is great news. There is significant muscle protrusion. 09/13/19 upon evaluation today patient appears to be doing about the same in regard to his leg unfortunately. He still has a lot of drainage coming from the ulceration there is still muscle exposed. With that being said the patient's last wound culture still showed an intermediate finding with regard to the Pseudomonas he still having the bluish/green drainage as well. Overall I do not know that the wound has completely cleared of infection at this point. Fortunately there is no signs of active infection systemically at this point which is good news. 09/20/2019 upon evaluation today patient's wound actually appears to be doing about the same based on what I am seeing currently. I do not see any signs of systemic infection he still does have evidence of some local infection and drainage. He did see Dr. Megan Salon last week and Dr. Megan Salon states that he probably does need a different IV antibiotic although he does not want to put him on this until the patient begins the Remicade infusion which is actually scheduled for about 10 days out from today on 13 May. Following that time Dr. Megan Salon is good to see him back and then will evaluate the feasibility of starting him on the  IV antibiotic therapy once again at that point. I do not disagree with this plan I do believe as Dr. Megan Salon stated in his note that I reviewed today that the patient's issue is multifactorial with the pyoderma being 1 aspect of this that were hoping the Remicade will be helpful for her. In the meantime I think the gentamicin is, helping to keep things under decent okay control in regard to the ulcer. 09/27/2019 upon evaluation today patient appears to be doing about the same with regard to his wound still there is a lot of muscle exposure though he does have some hyper granulation tissue noted around the edge and actually some granulation tissue starting to form over the muscle which is actually good news. Fortunately there is no evidence of active infection which is also good news. His pain is less at this point. 5/21; this is a patient I have not seen in a long time. 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 He has a large wound on the left lateral leg with protruding muscle. He comes in the clinic today showing the same area on his left medial ankle. He says there is been a spot there for some time although we have not previously defined this. Today he has a clearly defined area with slight amount of skin breakdown surrounded by raised areas with a purplish hue in color. This is not painful he says it is irritated.  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 He still has muscle protruding from the lateral portion of his left leg but fortunately the new area that they were concerned about at his last visit does not appear to have opened at this point. He is currently on Remicade infusions and seems to be doing better in my opinion in fact the wound itself seems to be overall much better. The purplish discoloration that he did have seems to have resolved  and I think that is a good sign that hopefully the Remicade is doing its job. He does have some biofilm noted over the surface of the wound. 11/01/2019 on evaluation today patient's wound actually appears to be doing excellent at this time. Fortunately there is no evidence of active infection and overall I feel like he is making great progress. The Remicade seems to be due excellent job in my opinion. [name], [name] ([id number]) 11/08/19 evaluation today vision actually appears to be doing quite well with regard to his weight ulcer. He's been tolerating dressing changes without complication. Fortunately there is no evidence of infection. No fevers, chills, nausea, or vomiting noted at this time. Overall states that is having more itching than pain which is actually a good sign in my opinion. 12/13/2019 upon evaluation today patient appears to be doing well today with regard to his wound. He has been tolerating the dressing changes without complication. Fortunately there is no sign of active infection at this time. No fevers, chills, nausea, vomiting, or diarrhea. Overall I feel like the infusion therapy has been very beneficial for him. 01/06/2020 on evaluation today patient appears to be doing well with regard to his wound. This is measuring smaller and actually looks to be doing better. Fortunately there is no signs of active infection at this point. No fevers, chills, nausea, vomiting, or diarrhea. With that being said he does still have the blue-green drainage but this does not seem to be causing any significant issues currently. He has been using the gentamicin that does seem to be keeping things under decent control at this point. He goes later this morning for his next infusion therapy for the pyoderma which seems to also be very beneficial. 02/07/2020 on evaluation today patient appears to be doing about the same in regard to his wounds currently. Fortunately there is no signs of active infection  systemically he does still have evidence of local infection still using gentamicin. He also is showing some signs of improvement albeit slowly I do feel like we are making some progress here. 02/21/2020 upon evaluation today patient appears to be making some signs of improvement the wound is measuring a little bit smaller which is great news and overall I am very pleased with where he stands currently. He is going to be having infusion therapy treatment on the 15th of this month. Fortunately there is no signs of active infection at this time. 03/13/2020 I do believe patient's wound is actually showing some signs of improvement here which is great news. He has continue with the infusion therapy through rheumatology/dermatology at [facility]. That does seem to be beneficial. I still think he gets as much benefit from this as he did from the prednisone initially but nonetheless obviously this is less harsh on his body that the prednisone as far as they are concerned. 03/31/2020 on evaluation today patient's wound actually showing signs of some pretty good improvement in regard  to the overall appearance of the wound bed. There is still muscle exposed though he does have some epithelial growth around the edges of the wound. Fortunately there is no signs of active infection at this time. No fevers, chills, nausea, vomiting, or diarrhea. 04/24/2020 upon evaluation today patient appears to be doing about the same in regard to his leg ulcer. He has been tolerating the dressing changes without complication. Fortunately there is no signs of active infection at this time. No fevers, chills, nausea, vomiting, or diarrhea. With that being said he still has a lot of irritation from the bandaging around the edges of the wound. We did discuss today the possibility of a referral to plastic surgery. 05/22/2020 on evaluation today patient appears to be doing well with regard to his wounds all things considered. He has not been able  to get the Chantix apparently there is a recall nurse that I was unaware of put out by [employer] involuntarily. Nonetheless for now I am and I have to do some research into what may be the best option for him to help with quitting in regard to smoking and we discussed that today. 06/26/2020 upon evaluation today patient appears to be doing well with regard to his wound from the standpoint of infection I do not see any signs of infection at this point. With that being said unfortunately he is still continuing to have issues with muscle exposure and again he is not having a whole lot of new skin growth unfortunately. There does not appear to be any signs of active infection at this time. No fevers, chills, nausea, vomiting, or diarrhea. 07/10/2020 upon evaluation today patient appears to be doing a little bit more poorly currently compared to where he was previous. I am concerned currently about an active infection that may be getting worse especially in light of the increased size and tenderness of the wound bed. No fevers, chills, nausea, vomiting, or diarrhea. 07/24/2020 upon evaluation today patient appears to be doing poorly in regard to his leg ulcer. He has been tolerating the dressing changes without complication but unfortunately is having a lot of discomfort. 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 Subsequently I think he is going require possibly IV antibiotics to get this under control. I am very concerned about the severity of his infection and the amount of discomfort he is having. 07/31/2020 upon evaluation today patient appears to be doing about the same in regard to his leg wound. He did see Dr. Megan Salon and Dr. Megan Salon is actually going to start him on IV antibiotics. He goes for the PICC line tomorrow. With that being said there do not have that run for 2 weeks and then see how things are doing and depending on how he  is progressing they may extend that a little longer. Nonetheless I am glad this is getting ready to be in place and definitely feel it may help the patient. In the meantime is been using mainly triamcinolone to the wound bed has an anti-inflammatory. 08/07/2020 on evaluation today patient appears to be doing well with regard to his wound compared even last week. In the interim he has gotten the PICC line placed and overall this seems to be doing excellent. There does not appear to be any evidence of infection which is great news systemically although locally of course has had the infection this appears to be improving with the use of the antibiotics. 08/14/2020 upon  evaluation today patient's wound actually showing signs of excellent improvement. Overall the irritation has significantly improved the drainage is back down to more of a normal level and his pain is really pretty much nonexistent compared to what it was. Obviously I think that this is significantly improved secondary to the IV antibiotic therapy which has made all the difference in the world. Again he had a resistant form of Pseudomonas for which oral antibiotics just was not cutting it. Nonetheless I do think that still we need to consider the possibility of a surgical closure for this wound is been open so long and to be honest with muscle exposed I think this can be very hard to get this to close outside of this although definitely were still working to try to do what we can in that regard. 08/21/2020 upon evaluation today patient appears to be doing very well with regard to his wounds on the left lateral lower extremity/calf area. Fortunately there does not appear to be signs of active infection which is great news and overall very pleased with where things stand today. He is actually wrapping up his treatment with IV antibiotics tomorrow. After that we will see where things go from there. 08/28/2020 upon evaluation today patient appears  to be doing decently well with regard to his leg ulcer. There does not appear to be any signs of active infection which is great news and overall very pleased with where things stand today. No fevers, chills, nausea, vomiting, or diarrhea. 09/18/2020 upon evaluation today patient appears to be doing well with regard to his infection which I feel like is better. Unfortunately he is not doing as well with regard to the overall size of the wound which is not nearly as good at this point. I feel like that he may be having an issue here with the pyoderma being somewhat out of control. I think that he may benefit from potentially going back and talking to the dermatologist about [name], [name] ([id number]) what to do from the pyoderma standpoint. I am not certain if the infusions are helping nearly as much is what the prednisone did in the past. 10/02/2020 upon evaluation today patient appears to be doing well with regard to his leg ulcer. He did go to the [facility] nurse. Unfortunately they feel like there is a 10% chance that most that he would be able to heal and that the skin graft would take. Obviously this has led him to not be able to go down that path as far as treatment is concerned. Nonetheless he does seem to be doing a little bit better with the prednisone that I gave him last time. I think that he may need to discuss with dermatology the possibility of long-term prednisone as that seems to be what is most helpful for him to be perfectly honest. I am not sure the Remicade is really doing the job. 10/17/2020 upon evaluation today patient appears to be doing a little better in regard to his wound. In fact the case has been since we did the prednisone on May 2 for him that we have noticed a little bit of improvement each time we have seen a size wise as well as appearance wise as well as pain wise. I think the prednisone has had a greater effect then the infusion therapy has to be perfectly honest. With  that being said the patient also feels significantly better compared to what he was previous. All of this is good news but  nonetheless I am still concerned about the fact that again we are really not set up to long-term manage him as far as prednisone is concerned. Obviously there are things that you need to be watched I completely understand the risk of prednisone usage as well. 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 With all that being said I do believe that we can give him another round of the prednisone which she is requesting today because of the improvement that he seen since we did that first round. 10/30/2020 upon evaluation today patient's wound actually is showing signs of doing quite well. There does not appear to be any evidence of infection which is great news and overall very pleased with where things stand today. No fevers, chills, nausea, vomiting, or diarrhea. He tells me that the prednisone still has seem to have helped he wonders if we can extend that for just a little bit longer. He did not have the appointment with a dermatologist although he did have an infusion appointment last Friday. That was at [facility]. With that being said he tells me he could not do both that as well as the appointment with the physician on the same day therefore that is can have to be rescheduled. I really want to see if there is anything they feel like that could be done differently to try to help this out as I am not really certain that the infusions are helping significantly here. 11/13/2020 upon evaluation today patient unfortunately appears to be doing somewhat poorly in regard to his wound I feel like this is actually worsening from the standpoint of the pyoderma spreading. I still feel like that he may need something different as far as trying to manage this going forward. Again we did the prednisone unfortunately his blood sugars are not doing so well  because of this. Nonetheless I believe that the patient likely needs to try topical steroid. We have done triamcinolone for a while I think going with something stronger such as clobetasol could be beneficial again this is not something I do lightly I discussed this with the patient that again this does not normally put underneath an occlusive dressing. Nonetheless I think a thin film as such could help with some of the stronger anti-inflammatory effects. We discussed this today. He would like to try to give this a trial for the next couple weeks. I definitely think that is something that we can do. Evaluate7/03/2021 and today patient's wound bed actually showed signs of doing really about the same. There was a little expansion of the size of the wound and that leading edge that we done looking out although the clobetasol does seem to have slowed this down a bit in my opinion. There is just 1 small area that still seems to be progressing based on what I see. Nonetheless I am concerned about the fact this does not seem to be improving if anything seems to be doing a little bit worse. I do not know that the infusions are really helping him much as next infusion is August 5 his appointment with dermatology is July 25. Either way I really think that we need to have a conversation potentially about this and I am actually going to see if I can talk with Dr. Lillia Carmel in order to see where things stand as well. 12/11/2020 upon evaluation today patient appears to be doing worse in regard to his leg ulcer. Unfortunately  I just do not think this is making the progress that I would like to see at this point. Honestly he does have an appointment with dermatology and this is in 2 days. I am wondering what they may have to offer to help with this. Right now what I am seeing is that he is continuing to show signs of worsening little by little. Obviously that is not great at all. Is the exact opposite of what we are  looking for. 12/18/2020 upon evaluation today patient appears to be doing a little better in regard to his wound. The dermatologist actually did do some steroid injections into the wound which does seem to have been beneficial in my opinion. That was on the 25th already this looks a little better to me than last time I saw him. With that being said we did do a culture and this did show that he has Staph aureus noted in abundance in the wound. With that being said I do think that getting him on an oral antibiotic would be appropriate as well. Also think we can compression wrap and this will make a difference as well. 12/28/2020 upon evaluation today patient's wound is actually showing signs of doing much better. I do believe the compression wrap is helping he has a lot of drainage but to be honest I think that the compression is helping to some degree in this regard as well as not draining through which is also good news. No fevers, chills, nausea, vomiting, or diarrhea. 01/04/2021 upon evaluation today patient appears to be doing well with regard to his wound. Overall things seem to be doing quite well. He did have a little bit of reaction to the CarboFlex Sorbact he will be using that any longer. With that being said he is controlled as far as the drainage is concerned overall and seems to be doing quite well. I do not see any signs of active infection at this time which is great news. No fevers, chills, nausea, vomiting, or diarrhea. 01/11/2021 upon evaluation today patient appears to be doing well with regard to his wounds. He has been tolerating the dressing changes without complication. Fortunately there does not appear to be any signs of active infection at this time which is great news. Overall I am extremely pleased with where we stand currently. No fevers, chills, nausea, vomiting, or diarrhea. Where using clobetasol in the wound bed he has a lot of new skin growth which is awesome as  well. 01/18/2021 upon evaluation today patient appears to be doing very well in regard to his leg ulcer. He has been tolerating the dressing changes without complication. Fortunately there does not appear to be any signs of active infection which is great news. In general I think that he is making excellent progress 01/25/2021 upon evaluation today patient appears to be doing well with regard to his wound on the leg. I am actually extremely pleased with where things stand today. There does not appear to be any signs of active infection which is great news and overall I think that we are definitely headed in the appropriate direction based on what I am seeing currently. There does not appear to be any signs of active infection also excellent news. 02/06/2021 upon evaluation today patient appears to be doing well with regard to his wound. Overall visually this is showing signs of significant improvement which is great news. I do not see any signs of active infection systemically which is great even locally  I do not think that we are seeing any major complications here. We did do fluorescence imaging with the [employer] today. The patient does have some odor and drainage noted and again this is something that I think would benefit him to probably come more frequently for nurse visits. 02/19/2021 upon evaluation today patient actually appears to be doing quite well in regard to his wound. He has been tolerating the dressing Moroney, Cleaven ([id number]) changes without complication and overall I think that this is making excellent progress. I do not see any evidence of active infection at this point which is great news as well. No fevers, chills, nausea, vomiting, or diarrhea. 10/10; wound is made nice progress healthy granulation with a nice rim of epithelialization which seems to be expanding even from last week he has a deeper area in the inferior part of the more distal part of the wound with not quite as  healthy as surface. This area will need to be followed. Using clobetasol and Hydrofera Blue 03/05/2021 upon evaluation today patient appears to be doing very well in regard to his leg ulcer. He has been tolerating dressing changes without complication. Fortunately there does not appear to be any signs of active infection which is great news and overall I am extremely pleased with where we stand currently. 03/12/2021 upon evaluation today patient appears to be doing well with regard to his wound in fact this is extremely extremely good based on what we are seeing today there does not appear to be any signs of active infection and overall I think that he is doing awesome from the standpoint of healing in general. I am extremely pleased with how things seem to be progressing with regard to this pyoderma. Clobetasol has done wonders for him. I think the compression wrapping has also been of great benefit. 03/19/2021 upon evaluation today patient appears to be doing well with regard to his wound. He is tolerating the dressing changes without complication. In fact I feel like that he is actually making excellent progress at this point based on what I am seeing. No fevers, chills, nausea, vomiting, or diarrhea. 03/26/2021 upon evaluation today patient appears to be doing well with regard to his wound. This again is measuring smaller and looking better. Again the progress is slow but nonetheless continual with what we have been seeing. I do believe that the current plan is doing awesome for him. 04/09/2021 upon evaluation today patient appears to be doing well with regard to his leg ulcer. This is showing signs of excellent improvement the muscle is completely closed over and there does not appear to be any evidence of inflammation at this point his drainage is significantly improved. Overall I think that he would be a good candidate for looking into a skin substitute at this point as well. We will get a look  into some approvals in that regard. Potentially TheraSkin as well as Apligraf could both be considered just depending on insurance coverage. 04/16/2021 upon evaluation today patient appears to be doing well at this point. He has been approved for the Apligraf which we could definitely order although I would like to try to get the TheraSkin approved if at all possible. I did fax notes into them today and I Georgina Peer try to give a call as well. Overall the wound appears to be doing decently well today. 04/20/2021 upon evaluation today patient actually appears to be doing quite well in regard to his wound with that being said we  are trying to see what we can do about speeding up the healing process. For that reason we did discuss the possibility of a skin substitute. We got the Apligraf approved. For that reason we will get a go ahead and see what we can do with the Apligraf at this point. I am still trying to get the TheraSkin approved but I have not heard anything from the insurance company yet I have called and talked to them earlier in the week and to be honest this was about half an hour that I spent on the phone they told me I would hear something within 10-15 business days. 04/27/2021 upon evaluation today patient appears to be doing excellent in regard to his wound. I do believe the Apligraf has been beneficial. With that being said he has had a little bit of increased pain has been a little bit concerned. I do want to go ahead and apply his steroid cream today the clobetasol and then put the Apligraf over I just do not think I want to risk not putting the clobetasol on based on what is been going on here currently. Patient voiced understanding. 05/04/2021 upon evaluation today patient is making excellent improvement. Overall there is definitely decrease in the size of the wound today and I am very pleased in that regard. I do not see any signs of active infection locally nor systemically at this  point. No fevers, chills, nausea, vomiting, or diarrhea. 05/11/2021 upon evaluation today patient appears to be doing well with regard to his wound. He has been tolerating Apligraf's without complication and is making excellent progress this is application #4 today. 12/30; patient here for Apligraf application. Appears to be doing well small wound there has been a lot of healing 05/28/2021 upon evaluation today patient appears to be doing well with regard to his wound. Has been tolerating the dressing changes without complication. Fortunately I do not see any signs of active infection locally nor systemically at this point which is great news. He is done with the Apligraf and the first round and overall this has filled in quite nicely I am not even certain he needs any additional at the moment. I would actually try to go back to clobetasol with [facility] which previously was doing well for him. 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 He is using clobetasol and Hydrofera Blue. Surface area of the wound is smaller and the surface looks very healthy. 06/11/2021 upon evaluation today patient actually appears to be showing signs of good improvement which is great news. Fortunately I do not see any signs of active infection at this time which is also excellent news. I do believe that the patient is doing well with the clobetasol and Hydrofera Blue. He does have some irritation and itching around the rest of the leg will use some triamcinolone over this area. 06/18/2021 upon evaluation today patient appears to be doing well with regard to his wound. He is showing signs of excellent improvement and overall I am extremely pleased with where we stand today. We are moving in the right direction. 06/25/2021 upon evaluation today patient appears to be doing well with regard to his wound. In fact this is doing also not showing signs of excellent improvement  overall very pleased with where we stand today. I do not see any evidence of active infection locally or systemically at this time which is also great news. 07/02/2021 upon evaluation patient's wound  bed actually showed signs of good granulation and epithelization at this point. And this is measuring significantly smaller and overall I am extremely pleased with where we stand today. No fevers, chills, nausea, vomiting, or diarrhea. 07/09/2021 upon evaluation patient's wound bed actually showed signs of good granulation and epithelization at this point. Fortunately there does not appear to be any evidence of active infection locally or systemically which is great news and overall I am extremely pleased with where we stand at this point. 07/16/2021 upon evaluation today patient appears to be doing well with regard to his wound all things considered although it is maybe a little bit larger than what its been. This does have me a little concerned. Actually question about whether anything is changed and initially he told me [name], [name] ([id number]) know. In fact it was not until the end of the visit that he actually did mention that he had missed his infusion in January. This very well may be what is because the difference is also seeing currently. That definitely has seem to been helping more recently. Electronic Signature(s) Signed: 07/16/2021 10:20:28 AM By: Worthy Keeler PA-C Entered By: Worthy Keeler on 07/16/2021 10:20:28 [name], [name] ([id number]) -------------------------------------------------------------------------------- Physical Exam Details Patient Name: [name] Date of Service: 07/16/2021 8:15 AM Medical Record Number: [id number] Patient Account Number: [id number] Date of Birth/Sex: 1981/03/28 (40 y.o. M) Treating RN: Levora Dredge Primary Care Provider: [name] Other Clinician: Referring Provider: [name] Treating Provider/Extender: [name] Cliche in  Treatment: 30 Constitutional Well-nourished and well-hydrated in no acute distress. Respiratory normal breathing without difficulty. Psychiatric this patient is able to make decisions and demonstrates good insight into disease process. Alert and Oriented x 3. pleasant and cooperative. Notes Upon inspection patient's wound bed actually showed signs of good granulation epithelization at this point. Fortunately I do not see any evidence of active infection locally or systemically which is great news. No fevers, chills, nausea, vomiting, or diarrhea. Electronic Signature(s) Signed: 07/16/2021 10:20:47 AM By: Worthy Keeler PA-C Entered By: Worthy Keeler on 07/16/2021 10:20:47 [name], [name] ([id number]) -------------------------------------------------------------------------------- Physician Orders Details Patient Name: [name] Date of Service: 07/16/2021 8:15 AM Medical Record Number: [id number] Patient Account Number: [id number] Date of Birth/Sex: Feb 26, 1981 (40 y.o. M) Treating RN: Levora Dredge Primary Care Provider: [name] Other Clinician: Referring Provider: [name] Treating Provider/Extender: [name] Cliche in Treatment: [phone] Verbal / Phone Orders: No Diagnosis Coding ICD-10 Coding Code Description I87.2 Venous insufficiency (chronic) (peripheral) L97.222 Non-pressure chronic ulcer of left calf with fat layer exposed E11.622 Type 2 diabetes mellitus with other skin ulcer L88 Pyoderma gangrenosum F17.208 Nicotine dependence, unspecified, with other nicotine-induced disorders Follow-up Appointments o Return Appointment in 1 week. o Nurse Visit as needed - twice a week Bathing/ Shower/ Hygiene o Clean wound with Normal Saline or wound cleanser. o May shower with wound dressing protected with water repellent cover or cast protector. Edema Control - Lymphedema / Segmental Compressive Device / Other o Optional: One layer of unna paste to top  of compression wrap (to act as an anchor). o Elevate, Exercise Daily and Avoid Standing for Long Periods of Time. o Elevate legs to the level of the heart and pump ankles as often as possible o Elevate leg(s) parallel to the floor when sitting. Wound Treatment Wound #1 - Lower Leg Wound Laterality: Left, Lateral Cleanser: Wound Cleanser 3 x Per Week/30 Days Discharge Instructions: Wash your hands with soap and water. Remove old dressing, discard into plastic bag  and place into trash. Cleanse the wound with Wound Cleanser prior to applying a clean dressing using gauze sponges, not tissues or cotton balls. Do not scrub or use excessive force. Pat dry using gauze sponges, not tissue or cotton balls. Peri-Wound Care: Moisturizing Lotion 3 x Per Week/30 Days Discharge Instructions: Suggestions: Theraderm, Eucerin, Cetaphil, or patient preference. Topical: Clobetasol Propionate ointment 0.05%, 60 (g) tube 3 x Per Week/30 Days Discharge Instructions: Pt to bring to appt- Topical: Triamcinolone Acetonide Cream, 0.1%, 15 (g) tube 3 x Per Week/30 Days Discharge Instructions: Apply as directed by provider. To leg not on wound Primary Dressing: Promogran Matrix 4.34 (in) 3 x Per Week/30 Days Discharge Instructions: Moisten w/normal saline or sterile water; Cover wound as directed. Do not remove from wound bed. Secondary Dressing: Zetuvit Plus Silicone Non-bordered 5x5 (in/in) 3 x Per Week/30 Days Compression Wrap: Medichoice 4 layer Compression System, 35-40 mmHG (Generic) 3 x Per Week/30 Days Discharge Instructions: Apply multi-layer wrap as directed. Electronic Signature(s) Signed: 07/16/2021 5:31:39 PM By: Worthy Keeler PA-C Signed: 07/17/2021 4:37:03 PM By: Levora Dredge Entered By: Levora Dredge on 07/16/2021 09:24:56 Abplanalp, [name] ([id number]) RODEL, GLASPY ([id number]) -------------------------------------------------------------------------------- Problem List Details Patient Name:  [name] Date of Service: 07/16/2021 8:15 AM Medical Record Number: [id number] Patient Account Number: [id number] Date of Birth/Sex: [birth year] (40 y.o. M) Treating RN: Levora Dredge Primary Care Provider: [name] Other Clinician: Referring Provider: [name] Treating Provider/Extender: [name] Cliche in Treatment: 240 Active Problems ICD-10 Encounter Code Description Active Date MDM Diagnosis I87.2 Venous insufficiency (chronic) (peripheral) 12/04/2016 No Yes L97.222 Non-pressure chronic ulcer of left calf with fat layer exposed 12/04/2016 No Yes E11.622 Type 2 diabetes mellitus with other skin ulcer 04/09/2021 No Yes L88 Pyoderma gangrenosum 03/26/2017 No Yes F17.208 Nicotine dependence, unspecified, with other nicotine-induced disorders 04/24/2020 No Yes Inactive Problems ICD-10 Code Description Active Date Inactive Date L97.213 Non-pressure chronic ulcer of right calf with necrosis of muscle 04/02/2017 04/02/2017 Resolved Problems ICD-10 Code Description Active Date Resolved Date L97.321 Non-pressure chronic ulcer of left ankle limited to breakdown of skin 10/08/2019 10/08/2019 L03.116 Cellulitis of left lower limb 05/24/2019 05/24/2019 Electronic Signature(s) Signed: 07/16/2021 5:31:39 PM By: Worthy Keeler PA-C Entered By: Worthy Keeler on 07/16/2021 08:17:52 Llorente, Maximilien ([id number]) -------------------------------------------------------------------------------- Progress Note Details Patient Name: [name] Date of Service: 07/16/2021 8:15 AM Medical Record Number: [id number] Patient Account Number: [id number] Date of Birth/Sex: 1980-07-29 (40 y.o. M) Treating RN: Levora Dredge Primary Care Provider: [name] Other Clinician: Referring Provider: [name] Treating Provider/Extender: [name] Cliche in Treatment: 240 Subjective Chief Complaint Information obtained from Patient He is here in follow up evaluation for LLE pyoderma  ulcer History of Present Illness (HPI) 12/04/16; 41 year old man who comes into the clinic today for review of a wound on the posterior left calf. He tells me that is been there for about a year. He is not a diabetic he does smoke half a pack per day. He was seen in the ER on 11/20/16 felt to have cellulitis around the wound and was given clindamycin. An x-ray did not show osteomyelitis. The patient initially tells me that he has a milk allergy that sets off a pruritic itching rash on his lower legs which she scratches incessantly and he thinks that's what may have set up the wound. He has been using various topical antibiotics and ointments without any effect. He works in a [employer]ing [employer] and is on his feet all day. He does not have a prior  history of wounds however he does have the rash on both lower legs the right arm and the ventral aspect of his left arm. These are excoriations and clearly have had scratching however there are of macular looking areas on both legs including a substantial larger area on the right leg. This does not have an underlying open area. There is no blistering. The patient tells me that 2 years ago in [place] in response to the rash on his legs he saw a dermatologist who told him he had a condition which may be pyoderma gangrenosum although I may be putting words into his mouth. He seemed to recognize this. On further questioning he admits to a 5 year history of quiesced. ulcerative colitis. He is not in any treatment for this. He's had no recent travel 12/11/16; the patient arrives today with his wound and roughly the same condition we've been using silver alginate this is a deep punched out wound with some surrounding erythema but no tenderness. Biopsy I did did not show confirmed pyoderma gangrenosum suggested nonspecific inflammation and vasculitis but does not provide an actual description of what was seen by the pathologist. I'm really not able to understand this We have  also received information from the patient's dermatologist in [place] notes from April 2016. This was a doctor Agarwal-antal. 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 He was prescribed topical steroid high potency under occlusion which helped but at this point the patient did not have a deep punched out wound. 12/18/16; the patient's wound is larger in terms of surface area however this surface looks better and there is less depth. The surrounding erythema also is better. The patient states that the wrap we put on came off 2 days ago when he has been using his compression stockings. He we are in the process of getting a dermatology consult. 12/26/16 on evaluation today patient's left lower extremity wound shows evidence of infection with surrounding erythema noted. He has been tolerating the dressing changes but states that he has noted more discomfort. There is a larger area of erythema surrounding the wound. No fevers, chills, nausea, or vomiting noted at this time. With that being said the wound still does have slough covering the surface. He is not allergic to any medication that he is aware of at this point. In regard to his right lower extremity he had several regions that are erythematous and pruritic he wonders if there's anything we can do to help that. 01/02/17 I reviewed patient's wound culture which was obtained his visit last week. He was placed on doxycycline at that point. Unfortunately that does not appear to be an antibiotic that would likely help with the situation however the pseudomonas noted on culture is sensitive to Cipro. Also unfortunately patient's wound seems to have a large compared to last week's evaluation. Not severely so but there are definitely increased measurements in general. He is continuing to have discomfort as well he writes this to be a seven out of 10. In fact he would prefer me not to perform any debridement today due to the fact that he is having  discomfort and considering he has an active infection on the little reluctant to do so anyway. No fevers, chills, nausea, or vomiting noted at this time. 01/08/17; patient seems dermatology on September 5. I suspect dermatology will want the slides from the biopsy I did sent to their pathologist. I'm not sure if there is a way we can expedite that. In  any case the culture I did before I left on vacation 3 weeks ago showed Pseudomonas he was given 10 days of Cipro and per her description of her intake nurses is actually somewhat better this week although the wound is quite a bit bigger than I remember the last time I saw this. He still has 3 more days of Cipro 01/21/17; dermatology appointment tomorrow. He has completed the ciprofloxacin for Pseudomonas. Surface of the wound looks better however he is had some deterioration in the lesions on his right leg. Meantime the left lateral leg wound we will continue with sample 01/29/17; patient had his dermatology appointment but I can't yet see that note. He is completed his antibiotics. The wound is more superficial but considerably larger in circumferential area than when he came in. This is in his left lateral calf. He also has swollen erythematous areas with superficial wounds on the right leg and small papular areas on both arms. There apparently areas in her his upper thighs and buttocks I did not look at those. Dermatology biopsied the right leg. Hopefully will have their input next week. 02/05/17; patient went back to see his dermatologist who told him that he had a "scratching problem" as well as staph. He is now on a 30 day course of doxycycline and I believe she gave him triamcinolone cream to the right leg areas to help with the itching [not exactly sure but probably triamcinolone]. She apparently looked at the left lateral leg wound although this was not rebiopsied and I think felt to be ultimately part of the same pathogenesis. He is using sample  border foam and changing nevus himself. He now has a new open area on the right posterior leg which was his biopsy site I don't have any of the dermatology notes 02/12/17; we put the patient in compression last week with SANTYL to the wound on the left leg and the biopsy. Edema is much better and the depth of the wound is now at level of skin. Area is still the same Biopsy site on the right lateral leg we've also been using santyl with a border foam dressing and he is changing this himself. 02/19/17; Using silver alginate started last week to both the substantial left leg wound and the biopsy site on the right wound. He is tolerating compression well. Has a an appointment with his primary M.D. tomorrow wondering about diuretics although I'm wondering if the edema problem is actually lymphedema JOSELUIS, ALESSIO ([id number]) 02/26/17; the patient has been to see his primary doctor Dr. Jerrel Ivory at [facility] our primary care. She started him on Lasix 20 mg and this seems to have helped with the edema. However we are not making substantial change with the left lateral calf wound and inflammation. The biopsy site on the right leg also looks stable but not really all that different. 03/12/17; the patient has been to see vein and vascular Dr. Lucky Cowboy. He has had venous reflux studies I have not reviewed these. I did get a call from his dermatology office. 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 They therefore recommended clobetasol and tetracycline i.e. aggressive treatment for possible pyoderma gangrenosum. 03/26/17; apparently the patient just had reflux studies  not an appointment with Dr. dew. She arrives in clinic today having applied  clobetasol for 2-3 weeks. He notes over the last 2-3 days excessive drainage having to change the dressing 3-4 times a day and also expanding erythema. 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 Lillia Carmel at [facility] dermatology locally who kindly saw him at my request. A repeat biopsy apparently has confirmed pyoderma gangrenosum and he started on prednisone 60 mg yesterday. My concern was the degree of erythema medially extending from his left leg wound which was either inflammation from pyoderma or cellulitis. I put him on Augmentin however culture of the wound showed Pseudomonas which is quinolone sensitive. I really don't believe he has cellulitis however in view of everything I will continue and give him a course of Cipro. He is also on doxycycline as an immune modulator for the pyoderma. In addition to his original wound on the left lateral leg with surrounding erythema he has a wound on the right posterior calf which was an original biopsy site done by dermatology. This was felt to represent pathergy from pyoderma gangrenosum 04/16/17; pyoderma gangrenosum. Saw Dr. Lillia Carmel yesterday. He has been using topical antibiotics to both wound areas his original wound on the left and the biopsies/pathergy area on the right. There is definitely some improvement in the inflammation around the wound on the right although the patient states he has increasing sensitivity of the wounds. He is on prednisone 60 and doxycycline 1 as prescribed by Dr. Lillia Carmel. He is covering the topical antibiotic with gauze and putting this in his own compression stocks and changing this daily. He states that Dr. Lottie Rater did a culture of the left leg wound  yesterday 05/07/17; pyoderma gangrenosum. The patient saw Dr. Lillia Carmel yesterday and has a follow-up with her in one month. He is still using topical antibiotics to both wounds although he can't recall exactly what type. He is still on prednisone 60 mg. Dr. Lillia Carmel stated that the doxycycline could stop if we were in agreement. He has been using his own compression stocks changing daily 06/11/17; pyoderma gangrenosum with wounds on the left lateral leg and right medial leg. The right medial leg was induced by biopsy/pathergy. The area on the right is essentially healed. Still on high-dose prednisone using topical antibiotics to the wound 07/09/17; pyoderma gangrenosum with wounds on the left lateral leg. The right medial leg has closed and remains closed. He is still on prednisone 60. He tells me he missed his last dermatology appointment with Dr. Lillia Carmel but will make another appointment. He reports that her blood sugar at a recent screen in [place] was high 200's. He was 180 today. 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 I think he is going to require still much longer prednisone perhaps another 3 months before attempting to taper. In the meantime his wound is a lot better. Smaller. He is cleaning this off daily and applying topical antibiotics. When he was last in the clinic I thought about changing to [facility] and actually put in a couple of calls to dermatology although probably not during their business hours. In any case the wound looks better smaller I don't think there is any need to change what he is doing  08/06/17-he is here in follow up evaluation for pyoderma left leg ulcer. He continues on oral prednisone. He has been using triple antibiotic ointment. There is surface debris and we will transition to [facility] and have him return in 2 weeks. He has lost 30 pounds since his last appointment with lifestyle modification. He may benefit from topical steroid cream for treatment  this can be considered at a later date. 08/22/17 on evaluation today patient appears to actually be doing rather well in regard to his left lateral lower extremity ulcer. He has actually been managed by Dr. Dellia Nims most recently. Patient is currently on oral steroids at this time. This seems to have been of benefit for him. Nonetheless his last visit was actually with Leah on 08/06/17. Currently he is not utilizing any topical steroid creams although this could be of benefit as well. No fevers, chills, nausea, or vomiting noted at this time. 09/05/17 on evaluation today patient appears to be doing better in regard to his left lateral lower extremity ulcer. He has been tolerating the dressing changes without complication. He is using Santyl with good effect. Overall I'm very pleased with how things are standing at this point. Patient likewise is happy that this is doing better. 09/19/17 on evaluation today patient actually appears to be doing rather well in regard to his left lateral lower extremity ulcer. 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 He's not having any significant pain. 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 He has been tolerating the Santyl without complication and in general I feel like he's making good progress. 10/17/17 on evaluation today patient appears to be doing very well in regard to his left lateral lower surety ulcer. He has been tolerating the dressing changes without complication. There does not appear to be any evidence of infection he's alternating the Santyl and the triple antibiotic ointment every other day this seems to be doing well for him. 11/03/17 on evaluation today patient appears to be doing very well in regard to his left lateral lower extremity ulcer. He is been tolerating  the dressing changes without complication which is good news. Fortunately there does not appear to be any evidence of infection which is also great news. Overall is doing excellent they are starting to taper down on the prednisone is down to 40 mg at this point it also started topical clobetasol for him. 11/17/17 on evaluation today patient appears to be doing well in regard to his left lateral lower surety ulcer. He's been tolerating the dressing changes without complication. He does note that he is having no pain, no excessive drainage or discharge, and overall he feels like things are going about how he would expect and hope they would. Overall he seems to have no evidence of infection at this time in my opinion which is good news. 12/04/17-He is seen in follow-up evaluation for right lateral lower extremity ulcer. He has been applying topical steroid cream. Today's measurement show slight increase in size. Over the next 2 weeks we will transition to every other day Santyl and steroid cream. He has been encouraged to monitor for changes and notify clinic with any concerns 12/15/17 on evaluation today patient's left lateral motion the ulcer and fortunately is doing worse again at this point. This just since last week to this week  has close to doubled in size according to the patient. I did not seeing last week's I do not have a visual to compare this to in our system was also down so we do not have all the charts and at this point. Nonetheless it does have me somewhat concerned in regard to the fact that again he was worried enough about it he has contact the dermatology that placed them back on the full strength, 50 mg a day of the prednisone that he was taken previous. He continues to alternate using clobetasol along with Santyl at this point. He is obviously somewhat frustrated. [name], [name] ([id number]) 12/22/17 on evaluation today patient appears to be doing a little worse compared to last  evaluation. Unfortunately the wound is a little deeper and slightly larger than the last week's evaluation. With that being said he has made some progress in regard to the irritation surrounding at this time unfortunately despite that progress that's been made he still has a significant issue going on here. 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 For that reason I discussed with him today the possibility of perform a wound culture to ensure there's nothing overtly infected. 01/06/18 on evaluation today patient's wound is larger and deeper than previously evaluated. With that being said it did appear that his wound was infected after my last evaluation with him. Subsequently I did end up prescribing a prescription for Bactrim DS which she has been taking and having no complication with. Fortunately there does not appear to be any evidence of infection at this point in time as far as anything spreading, no want to touch, and overall I feel like things are showing signs of improvement. 01/13/18 on evaluation today patient appears to be even a little larger and deeper than last time. There still muscle exposed in the base of the wound. Nonetheless he does appear to be less erythematous I do believe inflammation is calming down also believe the infection looks like it's probably resolved at this time based on what I'm seeing. No fevers, chills, nausea, or vomiting noted at this time. 01/30/18 on evaluation today patient actually appears to visually look better for the most part. Unfortunately those visually this looks better he does seem to potentially have what may be an abscess in the muscle that has been noted in the central portion of the wound. This is the first time that I have noted what appears to be fluctuance in the central portion of the muscle.  With that being said I'm somewhat more concerned about the fact that this might indicate an abscess formation at this location. I do believe that an ultrasound would be appropriate. This is likely something we need to try to do as soon as possible. He has been switch to mupirocin ointment and he is no longer using the steroid ointment as prescribed by dermatology he sees them again next week he's been decreased from 60 to 40 mg of prednisone. 03/09/18 on evaluation today patient actually appears to be doing a little better compared to last time I saw him. There's not as much erythema surrounding the wound itself. He I did review his most recent infectious disease note which was dated 02/24/18. He saw Dr. Michel Bickers in [place]. 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 Patient is now off of antibiotics and they are continually observing this. There seems to be no change in the past two weeks in my pinion based on what the patient says and what I see today compared to what Dr. Megan Salon likely saw two weeks ago. No fevers, chills, nausea, or vomiting noted at this time. 03/23/18 on evaluation today patient's wound actually appears to be showing signs of improvement which is good news. He is currently still on the Dapsone. He is also working on tapering the prednisone to get off of this and Dr. Lottie Rater is working with him in this regard. Nonetheless overall I feel like the wound is doing well it does appear based on the infectious disease note that I reviewed from Dr. 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 I did also review the results of his ultrasound of left lower extremity which revealed there was a dentist tissue in the base of the wound without an abscess noted. 04/06/18 on evaluation  today the patient's left lateral lower extremity ulcer actually appears to be doing fairly well which is excellent news. There does not appear to be any evidence of infection at this time which is also great news. Overall he still does have a significantly large ulceration although little by little he seems to be making progress. He is down to 10 mg a day of the prednisone. 04/20/18 on evaluation today patient actually appears to be doing excellent at this time in regard to his left lower extremity ulcer. He's making signs of good progress unfortunately this is taking much longer than we would really like to see but nonetheless he is making progress. Fortunately there does not appear to be any evidence of infection at this time. No fevers, chills, nausea, or vomiting noted at this time. The patient has not been using the Santyl due to the cost he hadn't got in this field yet. He's mainly been using the antibiotic ointment topically. Subsequently he also tells me that he really has not been scrubbing in the shower I think this would be helpful again as I told him it doesn't have to be anything too aggressive to even make it believe just enough to keep it free of some of the loose slough and biofilm on the wound surface. 05/11/18 on evaluation today patient's wound appears to be making slow but sure progress in regard to the left lateral lower extremity ulcer. He is been tolerating the dressing changes without complication. Fortunately there does not appear to be any evidence of infection at this time. He is still just using triple antibiotic ointment along with clobetasol occasionally over the area. He never got the Santyl and really does not seem to intend to in my pinion. 06/01/18 on evaluation today patient appears to be doing a little better in regard to his left lateral lower extremity ulcer. He states that overall he does not feel like he is doing as well with the Dapsone as he did with the  prednisone. Nonetheless he sees his dermatologist later today and is gonna talk to them about the possibility of going back on the prednisone. Overall again I believe that the wound would be better if you would utilize Santyl but he really does not seem to be interested in going back to the [place] at this point. He has been using  triple antibiotic ointment. 06/15/18 on evaluation today patient's wound actually appears to be doing about the same at this point. Fortunately there is no signs of infection at this time. He has made slight improvements although he continues to not really want to clean the wound bed at this point. He states that he just doesn't mess with it he doesn't want to cause any problems with everything else he has going on. He has been on medication, antibiotics as prescribed by his dermatologist, for a staff infection of his lower extremities which is really drying out now and looking much better he tells me. Fortunately there is no sign of overall infection. 06/29/18 on evaluation today patient appears to be doing well in regard to his left lateral lower surety ulcer all things considering. Fortunately his staff infection seems to be greatly improved compared to previous. He has no signs of infection and this is drying up quite nicely. He is still the doxycycline for this is no longer on cental, Dapsone, or any of the other medications. His dermatologist has recommended possibility of an infusion but right now he does not want to proceed with that. 07/13/18 on evaluation today patient appears to be doing about the same in regard to his left lateral lower surety ulcer. Fortunately there's no signs of infection at this time which is great news. Unfortunately he still builds up a significant amount of Slough/biofilm of the surface of the wound he still is not really cleaning this as he should be appropriately. Again I'm able to easily with saline and gauze remove the majority of this on  the surface which if you would do this at home would likely be a dramatic improvement for him as far as getting the area to improve. Nonetheless overall I still feel like he is making progress is just very slow. I think Santyl will be of benefit for him as well. Still he has not gotten this as of this point. 07/27/18 on evaluation today patient actually appears to be doing little worse in regards of the erythema around the periwound region of the wound he also tells me that he's been having more drainage currently compared to what he was experiencing last time I saw him. He states not quite as bad as what he had because this was infected previously but nonetheless is still appears to be doing poorly. Fortunately there is no evidence of systemic infection at this point. The patient tells me that he is not going to be able to afford the Santyl. He is still waiting to hear about the infusion therapy with his dermatologist. Apparently she wants an updated colonoscopy first. [name], [name] ([id number]) 08/10/18 on evaluation today patient appears to be doing better in regard to his left lateral lower extremity ulcer. Fortunately he is showing signs of improvement in this regard he's actually been approved for Remicade infusion's as well although this has not been scheduled as of yet. Fortunately there's no signs of active infection at this time in regard to the wound although he is having some issues with infection of the right lower extremity is been seen as dermatologist for this. Fortunately they are definitely still working with him trying to keep things under control. 09/07/18 on evaluation today patient is actually doing rather well in regard to his left lateral lower extremity ulcer. He notes these actually having some hair grow back on his extremity which is something he has not seen in years. He also tells me that the pain  is really not giving them any trouble at this time which is also good news  overall she is very pleased with the progress he's using a combination of the mupirocin along with the probate is all mixed. 09/21/18 on evaluation today patient actually appears to be doing fairly well all things considered in regard to his looks from the ulcer. He's been tolerating the dressing changes without complication. Fortunately there's no signs of active infection at this time which is good news he is still on all antibiotics or prevention of the staff infection. He has been on prednisone for time although he states it is gonna contact his dermatologist and see if she put them on a short course due to some irritation that he has going on currently. Fortunately there's no evidence of any overall worsening this is going very slow I think cental would be something that would be helpful for him although he states that $50 for tube is quite expensive. He therefore is not willing to get that at this point. 10/06/18 on evaluation today patient actually appears to be doing decently well in regard to his left lateral leg ulcer. He's been tolerating the dressing changes without complication. Fortunately there's no signs of active infection at this time. Overall I'm actually rather pleased with the progress he's making although it's slow he doesn't show any signs of infection and he does seem to be making some improvement. I do believe that he may need a switch up and dressings to try to help this to heal more appropriately and quickly. 10/19/18 on evaluation today patient actually appears to be doing better in regard to his left lateral lower extremity ulcer. This is shown signs of having much less Slough buildup at this point due to the fact he has been using the [employer]. Obviously this is very good news. The overall size of the wound is not dramatically smaller but again the appearance is. 11/02/18 on evaluation today patient actually appears to be doing quite well in regard to his lower [place] ulcer. A  lot of the skin around the ulcer is actually somewhat irritating at this point this seems to be more due to the dressing causing irritation from the adhesive that anything else. Fortunately there is no signs of active infection at this time. 11/24/18 on evaluation today patient appears to be doing a little worse in regard to his overall appearance of his lower extremity ulcer. There's more erythema and warmth around the wound unfortunately. He is currently on doxycycline which he has been on for some time. With that being said I'm not sure that seems to be helping with what appears to possibly be an acute cellulitis with regard to his left lower extremity ulcer. No fevers, chills, nausea, or vomiting noted at this time. 12/08/18 on evaluation today patient's wounds actually appears to be doing significantly better compared to his last evaluation. He has been using Santyl along with alternating tripling about appointment as well as the steroid cream seems to be doing quite well and the wound is showing signs of improvement which is excellent news. Fortunately there's no evidence of infection and in fact his culture came back negative with only normal skin flora noted. 12/21/2018 upon evaluation today patient actually appears to be doing excellent with regard to his ulcer. This is actually the best that I have seen it since have been helping to take care of him. It is both smaller as well as less slough noted on the surface of  the wound and seems to be showing signs of good improvement with new skin growing from the edges. He has been using just the triamcinolone he does wonder if he can get a refill of that ointment today. 01/04/2019 upon evaluation today patient actually appears to be doing well with regard to his left lateral lower extremity ulcer. With that being said it does not appear to be that he is doing quite as well as last time as far as progression is concerned. There does not appear to be any  signs of infection or significant irritation which is good news. With that being said I do believe that he may benefit from switching to a collagen based dressing based on how clean The wound appears. 01/18/2019 on evaluation today patient actually appears to be doing well with regard to his wound on the left lower extremity. He is not made a lot of progress compared to where we were previous but nonetheless does seem to be doing okay at this time which is good news. There is no signs of active infection which is also good news. My only concern currently is I do wish we can get him into utilizing the collagen dressing his insurance would not pay for the supplies that we ordered although it appears that he may be able to order this through his supply company that he typically utilizes. This is [employer]. Nonetheless he did try to order it during the office visit today and it appears this did go through. We will see if he can get that it is a different brand but nonetheless he has collagen and I do think will be beneficial. 02/01/2019 on evaluation today patient actually appears to be doing a little worse today in regard to the overall size of his wounds. Fortunately there is no signs of active infection at this time. That is visually. Nonetheless when this is happened before it was due to infection. For that reason were somewhat concerned about that this time as well. 02/08/2019 on evaluation today patient unfortunately appears to be doing slightly worse with regard to his wound upon evaluation today. Is measuring a little deeper and a little larger unfortunately. I am not really sure exactly what is causing this to enlarge he actually did see his dermatologist she is going to see about initiating Humira for him. Subsequently she also did do steroid injections into the wound itself in the periphery. Nonetheless still nonetheless he seems to be getting a little bit larger he is gone back to just using the  steroid cream topically which I think is appropriate. I would say hold off on the collagen for the time being is definitely a good thing to do. Based on the culture results which we finally did get the final result back regarding it shows staph as the bacteria noted again that can be a normal skin bacteria based on the fact however he is having increased drainage and worsening of the wound measurement wise I would go ahead and place him on an antibiotic today I do believe for this. 02/15/2019 on evaluation today patient actually appears to be doing somewhat better in regard to his ulcer. There is no signs of worsening at this time I did review his culture results which showed evidence of Staphylococcus aureus but not MRSA. Again this could just be more related to the normal skin bacteria although he states the drainage has slowed down quite a bit he may have had a mild infection not just colonization.  And was much smaller and then since around10/04/2019 on evaluation today patient appears to be doing unfortunately worse as far as the size of the wound. I really feel like that this is steadily getting larger again it had been doing excellent right at the beginning of September we have seen a steady increase in the area of the wound it is almost 2-1/2 times the size it was on September 1. Obviously this is a bad trend this is not wanting to see. For that reason we went back to using just the topical triamcinolone cream which does seem to help with inflammation. I checked him for bacteria by way of culture and nothing showed positive there. I am considering giving him a short course of a tapering steroid Dayshaun Whobrey, Inigo ([id number]) today to see if that is can be beneficial for him. The patient is in agreement with giving that a try. 03/08/2019 on evaluation today patient appears to be doing very well in comparison to last evaluation with regard to his lower extremity ulcer. This is showing signs of  less inflammation and actually measuring slightly smaller compared to last time every other week over the past month and a half he has been measuring larger larger larger. Nonetheless I do believe that the issue has been inflammation the prednisone does seem to have been beneficial for him which is good news. No fevers, chills, nausea, vomiting, or diarrhea. 03/22/2019 on evaluation today patient appears to be doing about the same with regard to his leg ulcer. He has been tolerating the dressing changes without complication. With that being said the wound seems to be mostly arrested at its current size but really is not making any progress except for when we prescribed the prednisone. He did show some signs of dropping as far as the overall size of the wound during that interval week. Nonetheless this is something he is not on long-term at this point and unfortunately I think he is getting need either this or else the Humira which his dermatologist has discussed try to get approval for. With that being said he will be seeing his dermatologist on the 11th of this month that is November. 04/19/2019 on evaluation today patient appears to be doing really about the same the wound is measuring slightly larger compared to last time I saw him. He has not been into the office since November 2 due to the fact that he unfortunately had Covid as that his entire family. He tells me that it was rough but they did pull-through and he seems to be doing much better. Fortunately there is no signs of active infection at this time. No fevers, chills, nausea, vomiting, or diarrhea. 05/10/2019 on evaluation today patient unfortunately appears to be doing significantly worse as compared to last time I saw him. He does tell me that he has had his first dose of Humira and actually is scheduled to get the next one in the upcoming week. With that being said he tells me also that in the past several days he has been having a lot  of issues with green drainage she showed me a picture this is more blue-green in color. He is also been having issues with increased sloughy buildup and the wound does appear to be larger today. Obviously this is not the direction that we want everything to take based on the starting of his Humira. Nonetheless I think this is definitely a result of likely infection and to be honest I think this  is probably Pseudomonas causing the infection based on what I am seeing. 05/24/2019 on evaluation today patient unfortunately appears to be doing significantly worse compared to his prior evaluation with me 2 weeks ago. I did review his culture results which showed that he does have Staph aureus as well as Pseudomonas noted on the culture. Nonetheless the Levaquin that I prescribed for him does not appear to have been appropriate and in fact he tells me he is no longer experiencing the green drainage and discharge that he had at the last visit. Fortunately there is no signs of active infection at this time which is good news although the wound has significantly worsened it in fact is much deeper than it was previous. We have been utilizing up to this point triamcinolone ointment as the prescription topical of choice but at this time I really feel like that the wound is getting need to be packed in order to appropriately manage this due to the deeper nature of the wound. Therefore something along the lines of an alginate dressing may be more appropriate. 05/31/2019 upon inspection today patient's wound actually showed signs of doing poorly at this point. Unfortunately he just does not seem to be making any good progress despite what we have tried. He actually did go ahead and pick up the Cipro and start taking that as he was noticing more green drainage he had previously completed the Levaquin that I prescribed for him as well. Nonetheless he missed his appointment for the seventh last week on Wednesday with the  wound care center and [facility] where his dermatologist referred him. Obviously I do think a second opinion would be helpful at this point especially in light of the fact that the patient seems to be doing so poorly despite the fact that we have tried everything that I really know how at this point. The only thing that ever seems to have helped him in the past is when he was on high doses of continual steroids that did seem to make a difference for him. 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 06/07/2019 upon evaluation today patient unfortunately appears to be doing worse yet again with regard to his wound. In fact I am starting to question whether or not he may have a fluid pocket in the muscle at this point based on the bulging and the soft appearance to the central portion of the muscle area. There is not anything draining from the muscle itself at this time which is good news but nonetheless the wound is expanding. I am not really seeing any results of the Humira as far as overall wound progression based on what I am seeing at this point. The patient has been referred for second opinion with regard to his wound to the [facility] wound care center by his dermatologist which I definitely am not in opposition to. Unfortunately we tried multiple dressings in the past including collagen, alginate, and at one point even Hydrofera Blue. With that being said he is never really used it for any significant amount of time due to the fact that he often complains of pain associated with these dressings and then will go back to either using the Santyl which she has done intermittently or more frequently the triamcinolone. He is also using his  own compression stockings. We have wrapped him in the past but again that was something else that he really was not  a big fan of. 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 He seemed to do much better when he was on the steroids although honestly I understand that the steroids are not necessarily the best medication to be on long-term obviously 06/14/2019 on evaluation today patient appears to be doing actually a little bit better with regard to the overall appearance with his leg. Unfortunately he does continue to have issues with what appears to be some fluid underneath the muscle although he did see the wound specialty center at [facility] last week their main goals were to see about infusion therapy in place of the Humira as they feel like that is not quite strong enough. They also recommended that we continue with the treatment otherwise as we are they felt like that was appropriate and they are okay with him continuing to follow-up here with [place] in that regard. With that being said they are also sending him to the vein specialist there to see about vein stripping and if that would be of benefit for him. Subsequently they also did not really address whether or not an ultrasound of the muscle area to see if there is anything that needs to be addressed here would be appropriate or not. For that reason I discussed this with him last week I think we may proceed down that road at this point. 06/21/2019 upon evaluation today patient's wound actually appears to be doing slightly better compared to previous evaluations. I do believe that he has made a difference with regard to the progression here with the use of oral steroids. Again in the past has been the only thing that is really calm things down. He does  tell me that from [facility] is gotten a good news from there that there are no further vein stripping that is necessary at this point. I do not have that available for review today although the patient did relay this to me. He also did obtain and have the ultrasound of the wound completed which I did sign off on today. It does appear that there is no fluid collection under the muscle this is likely then just edematous tissue in general. That is also good news. Overall I still believe the inflammation is the main issue here. He did inquire about the possibility of a wound VAC again with the muscle protruding like it is I am not really sure whether the wound VAC is necessarily ideal or not. That is something we will have to consider although I do believe he may need compression wrapping to try to help with edema control which could potentially be of benefit. 06/28/2019 on evaluation today patient appears to be doing slightly better measurement wise although this is not terribly smaller he least seems to be trending towards that direction. With that being said he still seems to have purulent drainage noted in the wound bed at this time. He has been on Levaquin followed by Cipro over the past month. Unfortunately he still seems to have some issues with  active infection at this time. I did perform a culture last week in order to evaluate and see if indeed there was still anything going on. Subsequently the culture did come back Joung, Bradey ([id number]) showing Pseudomonas which is consistent with the drainage has been having which is blue-green in color. He also has had an odor that again was somewhat consistent with Pseudomonas as well. Long story short it appears that the culture showed an intermediate finding with regard to how well the Cipro will work for the Pseudomonas infection. Subsequently being that he does not seem to be clearing up and at best what we are doing is just keeping this at [place] I think he  may need to see infectious disease to discuss IV antibiotic options. 07/05/2019 upon evaluation today patient appears to be doing okay in regard to his leg ulcer. He has been tolerating the dressing changes at this point without complication. Fortunately there is no signs of active infection at this time which is good news. No fevers, chills, nausea, vomiting, or diarrhea. With that being said he does have an appointment with infectious disease tomorrow and his primary care on Wednesday. Again the reason for the infectious disease referral was due to the fact that he did not seem to be fully resolving with the use of oral antibiotics and therefore we were thinking that IV antibiotic therapy may be necessary secondary to the fact that there was an intermediate finding for how effective the Cipro may be. Nonetheless again he has been having a lot of purulent and even green drainage. Fortunately right now that seems to have calmed down over the past week with the reinitiation of the oral antibiotic. Nonetheless we will see what Dr. Megan Salon has to say. 07/12/2019 upon evaluation today patient appears to be doing about the same at this point in regard to his left lower extremity ulcer. Fortunately there is no signs of active infection at this time which is good news I do believe the Levaquin has been beneficial I did review Dr. Hale Bogus note and to be honest I agree that the patient's leg does appear to be doing better currently. What we found in the past as he does not seem to really completely resolve he will stop the antibiotic and then subsequently things will revert back to having issues with blue-green drainage, increased pain, and overall worsening in general. Obviously that is the reason I sent him back to infectious disease. 07/19/2019 upon evaluation today patient appears to be doing roughly the same in size there is really no dramatic improvement. He has started back on the Levaquin at this point  and though he seems to be doing okay he did still have a lot of blue/green drainage noted on evaluation today unfortunately. I think that this is still indicative more likely of a Pseudomonas infection as previously noted and again he does see Dr. Megan Salon in just a couple of days. I do not know that were really able to effectively clear this with just oral antibiotics alone based on what I am seeing currently. Nonetheless we are still continue to try to manage as best we can with regard to the patient and his wound. I do think the wrap was helpful in decreasing the edema which is excellent news. No fevers, chills, nausea, vomiting, or diarrhea. 07/26/2019 upon evaluation today patient appears to be doing slightly better with regard to the overall appearance of the muscle there is no dark discoloration centrally. Fortunately there is no  signs of active infection at this time. No fevers, chills, nausea, vomiting, or diarrhea. Patient's wound bed currently the patient did have an appointment with Dr. Megan Salon at infectious disease last week. With that being said Dr. Megan Salon the patient states was still somewhat hesitant about put him on any IV antibiotics he wanted [place] to repeat cultures today and then see where things go going forward. He does look like Dr. Megan Salon because of some improvement the patient did have with the Levaquin wanted [place] to see about repeating cultures. If it indeed grows the Pseudomonas again then he recommended a possibility of considering a PICC line placement and IV antibiotic therapy. He plans to see the patient back in 1 to 2 weeks. 08/02/2019 upon evaluation today patient appears to be doing poorly with regard to his left lower extremity. We did get the results of his culture back it shows that he is still showing evidence of Pseudomonas which is consistent with the purulent/blue-green drainage that he has currently. Subsequently the culture also shows that he now is showing  resistance to the oral fluoroquinolones which is unfortunate as that was really the only thing to treat the infection prior. I do believe that he is looking like this is going require IV antibiotic therapy to get this under control. Fortunately there is no signs of systemic infection at this time which is good news. The patient does see Dr. Megan Salon tomorrow. 08/09/2019 upon evaluation today patient appears to be doing better with regard to his left lower extremity ulcer in regard to the overall appearance. He is currently on IV antibiotic therapy. As ordered by Dr. Megan Salon. Currently the patient is on ceftazidime which she is going to take for the next 2 weeks and then follow-up for 4 to 5-week appointment with Dr. Megan Salon. The patient started this this past Friday symptoms have not for a total of 3 days currently in full. 08/16/2019 upon evaluation today patient's wound actually does show muscle in the base of the wound but in general does appear to be much better as far as the overall evidence of infection is concerned. In fact I feel like this is for the most part cleared up he still on the IV antibiotics he has not completed the full course yet but I think he is doing much better which is excellent news. 08/23/2019 upon evaluation today patient appears to be doing about the same with regard to his wound at this point. He tells me that he still has pain unfortunately. Fortunately there is no evidence of systemic infection at this time which is great news. There is significant muscle protrusion. 09/13/19 upon evaluation today patient appears to be doing about the same in regard to his leg unfortunately. He still has a lot of drainage coming from the ulceration there is still muscle exposed. With that being said the patient's last wound culture still showed an intermediate finding with regard to the Pseudomonas he still having the bluish/green drainage as well. Overall I do not know that the wound has  completely cleared of infection at this point. Fortunately there is no signs of active infection systemically at this point which is good news. 09/20/2019 upon evaluation today patient's wound actually appears to be doing about the same based on what I am seeing currently. I do not see any signs of systemic infection he still does have evidence of some local infection and drainage. He did see Dr. Megan Salon last week and Dr. Megan Salon states that he probably  does need a different IV antibiotic although he does not want to put him on this until the patient begins the Remicade infusion which is actually scheduled for about 10 days out from today on 13 May. Following that time Dr. Megan Salon is good to see him back and then will evaluate the feasibility of starting him on the IV antibiotic therapy once again at that point. I do not disagree with this plan I do believe as Dr. Megan Salon stated in his note that I reviewed today that the patient's issue is multifactorial with the pyoderma being 1 aspect of this that were hoping the Remicade will be helpful for her. In the meantime I think the gentamicin is, helping to keep things under decent okay control in regard to the ulcer. 09/27/2019 upon evaluation today patient appears to be doing about the same with regard to his wound still there is a lot of muscle exposure though he does have some hyper granulation tissue noted around the edge and actually some granulation tissue starting to form over the muscle which is actually good news. Fortunately there is no evidence of active infection which is also good news. His pain is less at this point. 5/21; this is a patient I have not seen in a long time. 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 He has a large wound on the left lateral leg with protruding muscle. He comes in the clinic today showing the same area on his left medial ankle. He says there is been a spot there for some time  although we have not previously defined this. Today he has a clearly defined area with slight amount of skin breakdown surrounded by raised areas with a purplish hue in color. This is not painful he says it is irritated. 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 He still has muscle protruding from the lateral portion of his left leg but fortunately the new area that they were concerned about at his last visit does not appear to have opened at this point. He is currently on Remicade infusions and seems to be doing better in my opinion in fact the wound itself seems to be overall much better. The purplish discoloration that he did have seems to have resolved and I think that is a good sign that hopefully the Remicade is doing its job. He does Hamon, Pinchas ([id number]) have some biofilm noted over the surface of the wound. 11/01/2019 on evaluation today patient's wound actually appears to be doing excellent at this time. Fortunately there is no evidence of active infection and overall I feel like he is making great progress. The Remicade seems to be due excellent job in my opinion. 11/08/19 evaluation today vision actually appears to be doing quite well with regard to his weight ulcer. He's been tolerating dressing changes without complication. Fortunately there is no evidence of infection. No fevers, chills, nausea, or vomiting noted at this time. Overall states that is having more itching than pain which is actually a good sign in my opinion. 12/13/2019 upon evaluation today patient appears to be doing well today with regard to his wound. He has been tolerating the dressing changes without complication. Fortunately there is no sign of active infection at this time. No fevers, chills, nausea, vomiting, or diarrhea. Overall I feel like the infusion therapy has been very beneficial for  him. 01/06/2020 on evaluation today patient  appears to be doing well with regard to his wound. This is measuring smaller and actually looks to be doing better. Fortunately there is no signs of active infection at this point. No fevers, chills, nausea, vomiting, or diarrhea. With that being said he does still have the blue-green drainage but this does not seem to be causing any significant issues currently. He has been using the gentamicin that does seem to be keeping things under decent control at this point. He goes later this morning for his next infusion therapy for the pyoderma which seems to also be very beneficial. 02/07/2020 on evaluation today patient appears to be doing about the same in regard to his wounds currently. Fortunately there is no signs of active infection systemically he does still have evidence of local infection still using gentamicin. He also is showing some signs of improvement albeit slowly I do feel like we are making some progress here. 02/21/2020 upon evaluation today patient appears to be making some signs of improvement the wound is measuring a little bit smaller which is great news and overall I am very pleased with where he stands currently. He is going to be having infusion therapy treatment on the 15th of this month. Fortunately there is no signs of active infection at this time. 03/13/2020 I do believe patient's wound is actually showing some signs of improvement here which is great news. He has continue with the infusion therapy through rheumatology/dermatology at [facility]. That does seem to be beneficial. I still think he gets as much benefit from this as he did from the prednisone initially but nonetheless obviously this is less harsh on his body that the prednisone as far as they are concerned. 03/31/2020 on evaluation today patient's wound actually showing signs of some pretty good improvement in regard to the overall appearance of the wound bed. There is still  muscle exposed though he does have some epithelial growth around the edges of the wound. Fortunately there is no signs of active infection at this time. No fevers, chills, nausea, vomiting, or diarrhea. 04/24/2020 upon evaluation today patient appears to be doing about the same in regard to his leg ulcer. He has been tolerating the dressing changes without complication. Fortunately there is no signs of active infection at this time. No fevers, chills, nausea, vomiting, or diarrhea. With that being said he still has a lot of irritation from the bandaging around the edges of the wound. We did discuss today the possibility of a referral to plastic surgery. 05/22/2020 on evaluation today patient appears to be doing well with regard to his wounds all things considered. He has not been able to get the Chantix apparently there is a recall nurse that I was unaware of put out by [employer] involuntarily. Nonetheless for now I am and I have to do some research into what may be the best option for him to help with quitting in regard to smoking and we discussed that today. 06/26/2020 upon evaluation today patient appears to be doing well with regard to his wound from the standpoint of infection I do not see any signs of infection at this point. With that being said unfortunately he is still continuing to have issues with muscle exposure and again he is not having a whole lot of new skin growth unfortunately. There does not appear to be any signs of active infection at this time. No fevers, chills, nausea, vomiting, or diarrhea. 07/10/2020 upon evaluation today patient appears to be doing a little bit  more poorly currently compared to where he was previous. I am concerned currently about an active infection that may be getting worse especially in light of the increased size and tenderness of the wound bed. No fevers, chills, nausea, vomiting, or diarrhea. 07/24/2020 upon evaluation today patient appears to be doing poorly in  regard to his leg ulcer. He has been tolerating the dressing changes without complication but unfortunately is having a lot of discomfort. 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 Subsequently I think he is going require possibly IV antibiotics to get this under control. I am very concerned about the severity of his infection and the amount of discomfort he is having. 07/31/2020 upon evaluation today patient appears to be doing about the same in regard to his leg wound. He did see Dr. Megan Salon and Dr. Megan Salon is actually going to start him on IV antibiotics. He goes for the PICC line tomorrow. With that being said there do not have that run for 2 weeks and then see how things are doing and depending on how he is progressing they may extend that a little longer. Nonetheless I am glad this is getting ready to be in place and definitely feel it may help the patient. In the meantime is been using mainly triamcinolone to the wound bed has an anti-inflammatory. 08/07/2020 on evaluation today patient appears to be doing well with regard to his wound compared even last week. In the interim he has gotten the PICC line placed and overall this seems to be doing excellent. There does not appear to be any evidence of infection which is great news systemically although locally of course has had the infection this appears to be improving with the use of the antibiotics. 08/14/2020 upon evaluation today patient's wound actually showing signs of excellent improvement. Overall the irritation has significantly improved the drainage is back down to more of a normal level and his pain is really pretty much nonexistent compared to what it was. Obviously I think that this is significantly improved secondary to the IV antibiotic therapy which has made all the difference in the world. Again he had a resistant form of Pseudomonas for which oral antibiotics  just was not cutting it. Nonetheless I do think that still we need to consider the possibility of a surgical closure for this wound is been open so long and to be honest with muscle exposed I think this can be very hard to get this to close outside of this although definitely were still working to try to do what we can in that regard. 08/21/2020 upon evaluation today patient appears to be doing very well with regard to his wounds on the left lateral lower extremity/calf area. Fortunately there does not appear to be signs of active infection which is great news and overall very pleased with where things stand today. He is actually wrapping up his treatment with IV antibiotics tomorrow. After that we will see where things go from there. 08/28/2020 upon evaluation today patient appears to be doing decently well with regard to his leg ulcer. There does not appear to be any signs of Abplanalp, Kase ([id number]) active infection which is great news and overall very pleased with where things stand today. No fevers, chills, nausea, vomiting, or diarrhea. 09/18/2020 upon evaluation today patient appears to be doing well with regard to his infection which I feel like is better. Unfortunately he is not doing as well with regard to  the overall size of the wound which is not nearly as good at this point. I feel like that he may be having an issue here with the pyoderma being somewhat out of control. I think that he may benefit from potentially going back and talking to the dermatologist about what to do from the pyoderma standpoint. I am not certain if the infusions are helping nearly as much is what the prednisone did in the past. 10/02/2020 upon evaluation today patient appears to be doing well with regard to his leg ulcer. He did go to the Psychiatric nurse. Unfortunately they feel like there is a 10% chance that most that he would be able to heal and that the skin graft would take. Obviously this has led him to not be  able to go down that path as far as treatment is concerned. Nonetheless he does seem to be doing a little bit better with the prednisone that I gave him last time. I think that he may need to discuss with dermatology the possibility of long-term prednisone as that seems to be what is most helpful for him to be perfectly honest. I am not sure the Remicade is really doing the job. 10/17/2020 upon evaluation today patient appears to be doing a little better in regard to his wound. In fact the case has been since we did the prednisone on May 2 for him that we have noticed a little bit of improvement each time we have seen a size wise as well as appearance wise as well as pain wise. I think the prednisone has had a greater effect then the infusion therapy has to be perfectly honest. With that being said the patient also feels significantly better compared to what he was previous. All of this is good news but nonetheless I am still concerned about the fact that again we are really not set up to long-term manage him as far as prednisone is concerned. Obviously there are things that you need to be watched I completely understand the risk of prednisone usage as well. 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 With all that being said I do believe that we can give him another round of the prednisone which she is requesting today because of the improvement that he seen since we did that first round. 10/30/2020 upon evaluation today patient's wound actually is showing signs of doing quite well. There does not appear to be any evidence of infection which is great news and overall very pleased with where things stand today. No fevers, chills, nausea, vomiting, or diarrhea. He tells me that the prednisone still has seem to have helped he wonders if we can extend that for just a little bit longer. He did not have the appointment with a dermatologist although he did have an  infusion appointment last Friday. That was at [facility]. With that being said he tells me he could not do both that as well as the appointment with the physician on the same day therefore that is can have to be rescheduled. I really want to see if there is anything they feel like that could be done differently to try to help this out as I am not really certain that the infusions are helping significantly here. 11/13/2020 upon evaluation today patient unfortunately appears to be doing somewhat poorly in regard to his wound I feel like this is actually worsening from the standpoint of the pyoderma spreading. I  still feel like that he may need something different as far as trying to manage this going forward. Again we did the prednisone unfortunately his blood sugars are not doing so well because of this. Nonetheless I believe that the patient likely needs to try topical steroid. We have done triamcinolone for a while I think going with something stronger such as clobetasol could be beneficial again this is not something I do lightly I discussed this with the patient that again this does not normally put underneath an occlusive dressing. Nonetheless I think a thin film as such could help with some of the stronger anti-inflammatory effects. We discussed this today. He would like to try to give this a trial for the next couple weeks. I definitely think that is something that we can do. Evaluate7/03/2021 and today patient's wound bed actually showed signs of doing really about the same. There was a little expansion of the size of the wound and that leading edge that we done looking out although the clobetasol does seem to have slowed this down a bit in my opinion. There is just 1 small area that still seems to be progressing based on what I see. Nonetheless I am concerned about the fact this does not seem to be improving if anything seems to be doing a little bit worse. I do not know that the infusions are really  helping him much as next infusion is August 5 his appointment with dermatology is July 25. Either way I really think that we need to have a conversation potentially about this and I am actually going to see if I can talk with Dr. Lillia Carmel in order to see where things stand as well. 12/11/2020 upon evaluation today patient appears to be doing worse in regard to his leg ulcer. Unfortunately I just do not think this is making the progress that I would like to see at this point. Honestly he does have an appointment with dermatology and this is in 2 days. I am wondering what they may have to offer to help with this. Right now what I am seeing is that he is continuing to show signs of worsening little by little. Obviously that is not great at all. Is the exact opposite of what we are looking for. 12/18/2020 upon evaluation today patient appears to be doing a little better in regard to his wound. The dermatologist actually did do some steroid injections into the wound which does seem to have been beneficial in my opinion. That was on the 25th already this looks a little better to me than last time I saw him. With that being said we did do a culture and this did show that he has Staph aureus noted in abundance in the wound. With that being said I do think that getting him on an oral antibiotic would be appropriate as well. Also think we can compression wrap and this will make a difference as well. 12/28/2020 upon evaluation today patient's wound is actually showing signs of doing much better. I do believe the compression wrap is helping he has a lot of drainage but to be honest I think that the compression is helping to some degree in this regard as well as not draining through which is also good news. No fevers, chills, nausea, vomiting, or diarrhea. 01/04/2021 upon evaluation today patient appears to be doing well with regard to his wound. Overall things seem to be doing quite well. He did have a little bit  of reaction  to the [place] he will be using that any longer. With that being said he is controlled as far as the drainage is concerned overall and seems to be doing quite well. I do not see any signs of active infection at this time which is great news. No fevers, chills, nausea, vomiting, or diarrhea. 01/11/2021 upon evaluation today patient appears to be doing well with regard to his wounds. He has been tolerating the dressing changes without complication. Fortunately there does not appear to be any signs of active infection at this time which is great news. Overall I am extremely pleased with where we stand currently. No fevers, chills, nausea, vomiting, or diarrhea. Where using clobetasol in the wound bed he has a lot of new skin growth which is awesome as well. 01/18/2021 upon evaluation today patient appears to be doing very well in regard to his leg ulcer. He has been tolerating the dressing changes without complication. Fortunately there does not appear to be any signs of active infection which is great news. In general I think that he is making excellent progress 01/25/2021 upon evaluation today patient appears to be doing well with regard to his wound on the leg. I am actually extremely pleased with where things stand today. There does not appear to be any signs of active infection which is great news and overall I think that we are definitely headed in the appropriate direction based on what I am seeing currently. There does not appear to be any signs of active infection also excellent news. 02/06/2021 upon evaluation today patient appears to be doing well with regard to his wound. Overall visually this is showing signs of significant Millirons, Jowel ([id number]) improvement which is great news. I do not see any signs of active infection systemically which is great even locally I do not think that we are seeing any major complications here. We did do fluorescence imaging with the  [employer] today. The patient does have some odor and drainage noted and again this is something that I think would benefit him to probably come more frequently for nurse visits. 02/19/2021 upon evaluation today patient actually appears to be doing quite well in regard to his wound. He has been tolerating the dressing changes without complication and overall I think that this is making excellent progress. I do not see any evidence of active infection at this point which is great news as well. No fevers, chills, nausea, vomiting, or diarrhea. 10/10; wound is made nice progress healthy granulation with a nice rim of epithelialization which seems to be expanding even from last week he has a deeper area in the inferior part of the more distal part of the wound with not quite as healthy as surface. This area will need to be followed. Using clobetasol and Hydrofera Blue 03/05/2021 upon evaluation today patient appears to be doing very well in regard to his leg ulcer. He has been tolerating dressing changes without complication. Fortunately there does not appear to be any signs of active infection which is great news and overall I am extremely pleased with where we stand currently. 03/12/2021 upon evaluation today patient appears to be doing well with regard to his wound in fact this is extremely extremely good based on what we are seeing today there does not appear to be any signs of active infection and overall I think that he is doing awesome from the standpoint of healing in general. I am extremely pleased with how things seem to be  progressing with regard to this pyoderma. Clobetasol has done wonders for him. I think the compression wrapping has also been of great benefit. 03/19/2021 upon evaluation today patient appears to be doing well with regard to his wound. He is tolerating the dressing changes without complication. In fact I feel like that he is actually making excellent progress at this  point based on what I am seeing. No fevers, chills, nausea, vomiting, or diarrhea. 03/26/2021 upon evaluation today patient appears to be doing well with regard to his wound. This again is measuring smaller and looking better. Again the progress is slow but nonetheless continual with what we have been seeing. I do believe that the current plan is doing awesome for him. 04/09/2021 upon evaluation today patient appears to be doing well with regard to his leg ulcer. This is showing signs of excellent improvement the muscle is completely closed over and there does not appear to be any evidence of inflammation at this point his drainage is significantly improved. Overall I think that he would be a good candidate for looking into a skin substitute at this point as well. We will get a look into some approvals in that regard. Potentially TheraSkin as well as Apligraf could both be considered just depending on insurance coverage. 04/16/2021 upon evaluation today patient appears to be doing well at this point. He has been approved for the Apligraf which we could definitely order although I would like to try to get the TheraSkin approved if at all possible. I did fax notes into them today and I Georgina Peer try to give a call as well. Overall the wound appears to be doing decently well today. 04/20/2021 upon evaluation today patient actually appears to be doing quite well in regard to his wound with that being said we are trying to see what we can do about speeding up the healing process. For that reason we did discuss the possibility of a skin substitute. We got the Apligraf approved. For that reason we will get a go ahead and see what we can do with the Apligraf at this point. I am still trying to get the TheraSkin approved but I have not heard anything from the insurance company yet I have called and talked to them earlier in the week and to be honest this was about half an hour that I spent on the phone they told me  I would hear something within 10-15 business days. 04/27/2021 upon evaluation today patient appears to be doing excellent in regard to his wound. I do believe the Apligraf has been beneficial. With that being said he has had a little bit of increased pain has been a little bit concerned. I do want to go ahead and apply his steroid cream today the clobetasol and then put the Apligraf over I just do not think I want to risk not putting the clobetasol on based on what is been going on here currently. Patient voiced understanding. 05/04/2021 upon evaluation today patient is making excellent improvement. Overall there is definitely decrease in the size of the wound today and I am very pleased in that regard. I do not see any signs of active infection locally nor systemically at this point. No fevers, chills, nausea, vomiting, or diarrhea. 05/11/2021 upon evaluation today patient appears to be doing well with regard to his wound. He has been tolerating Apligraf's without complication and is making excellent progress this is application #4 today. 12/30; patient here for Apligraf application. Appears to be doing  well small wound there has been a lot of healing 05/28/2021 upon evaluation today patient appears to be doing well with regard to his wound. Has been tolerating the dressing changes without complication. Fortunately I do not see any signs of active infection locally nor systemically at this point which is great news. He is done with the Apligraf and the first round and overall this has filled in quite nicely I am not even certain he needs any additional at the moment. I would actually try to go back to clobetasol with [facility] which previously was doing well for him. 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 He is using clobetasol and Hydrofera Blue. Surface area of the wound is smaller and the surface looks very healthy. 06/11/2021 upon evaluation  today patient actually appears to be showing signs of good improvement which is great news. Fortunately I do not see any signs of active infection at this time which is also excellent news. I do believe that the patient is doing well with the clobetasol and Hydrofera Blue. He does have some irritation and itching around the rest of the leg will use some triamcinolone over this area. 06/18/2021 upon evaluation today patient appears to be doing well with regard to his wound. He is showing signs of excellent improvement and overall I am extremely pleased with where we stand today. We are moving in the right direction. 06/25/2021 upon evaluation today patient appears to be doing well with regard to his wound. In fact this is doing also not showing signs of excellent improvement overall very pleased with where we stand today. I do not see any evidence of active infection locally or systemically at this time which is also great news. 07/02/2021 upon evaluation patient's wound bed actually showed signs of good granulation and epithelization at this point. And this is measuring significantly smaller and overall I am extremely pleased with where we stand today. No fevers, chills, nausea, vomiting, or diarrhea. 07/09/2021 upon evaluation patient's wound bed actually showed signs of good granulation and epithelization at this point. Fortunately there does not appear to be any evidence of active infection locally or systemically which is great news and overall I am extremely pleased with where we GILFORD, LARDIZABAL ([id number]) stand at this point. 07/16/2021 upon evaluation today patient appears to be doing well with regard to his wound all things considered although it is maybe a little bit larger than what its been. This does have me a little concerned. Actually question about whether anything is changed and initially he told me know. In fact it was not until the end of the visit that he actually did mention that he had  missed his infusion in January. This very well may be what is because the difference is also seeing currently. That definitely has seem to been helping more recently. Objective Constitutional Well-nourished and well-hydrated in no acute distress. Vitals Time Taken: 8:07 AM, Height: 71 in, Weight: 338 lbs, BMI: 47.1, Temperature: 98.4 F, Pulse: 76 bpm, Respiratory Rate: 18 breaths/min, Blood Pressure: 133/86 mmHg. Respiratory normal breathing without difficulty. Psychiatric this patient is able to make decisions and demonstrates good insight into disease process. Alert and Oriented x 3. pleasant and cooperative. General Notes: Upon inspection patient's wound bed actually showed signs of good granulation epithelization at this point. Fortunately I do not see any evidence of active infection locally or systemically which is great news. No fevers, chills, nausea, vomiting, or diarrhea. Integumentary (  Hair, Skin) Wound #1 status is Open. Original cause of wound was Gradually Appeared. The date acquired was: 11/18/2015. The wound has been in treatment 240 weeks. The wound is located on the Left,Lateral Lower Leg. The wound measures 2.6cm length x 2cm width x 0.1cm depth; 4.084cm^2 area and 0.408cm^3 volume. There is Fat Layer (Subcutaneous Tissue) exposed. There is no tunneling or undermining noted. There is a medium amount of serosanguineous drainage noted. The wound margin is flat and intact. There is large (67-100%) red, pink granulation within the wound bed. There is a small (1-33%) amount of necrotic tissue within the wound bed including Adherent Slough. 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 There was a Four Layer Compression Therapy Procedure with a pre-treatment ABI of 1.2 by Levora Dredge, RN. Post procedure Diagnosis Wound #1: Same as Pre-Procedure Plan TALVIN, CHRISTIANSON ([id number]) Follow-up Appointments: Return Appointment in 1 week. Nurse Visit as needed - twice a week Bathing/ Shower/ Hygiene: Clean wound with Normal Saline or wound cleanser. May shower with wound dressing protected with water repellent cover or cast protector. Edema Control - Lymphedema / Segmental Compressive Device / Other: Optional: One layer of unna paste to top of compression wrap (to act as an anchor). Elevate, Exercise Daily and Avoid Standing for Long Periods of Time. Elevate legs to the level of the heart and pump ankles as often as possible Elevate leg(s) parallel to the floor when sitting. WOUND #1: - Lower Leg Wound Laterality: Left, Lateral Cleanser: Wound Cleanser 3 x Per Week/30 Days Discharge Instructions: Wash your hands with soap and water. Remove old dressing, discard into plastic bag and place into trash. Cleanse the wound with Wound Cleanser prior to applying a clean dressing using gauze sponges, not tissues or cotton balls. Do not scrub or use excessive force. Pat dry using gauze sponges, not tissue or cotton balls. Peri-Wound Care: Moisturizing Lotion 3 x Per Week/30 Days Discharge Instructions: Suggestions: Theraderm, Eucerin, Cetaphil, or patient preference. Topical: Clobetasol Propionate ointment 0.05%, 60 (g) tube 3 x Per Week/30 Days Discharge Instructions: Pt to bring to appt- Topical: Triamcinolone Acetonide Cream, 0.1%, 15 (g) tube 3 x Per Week/30 Days Discharge Instructions: Apply as directed by provider. To leg not on wound Primary Dressing: Promogran Matrix 4.34 (in) 3 x Per Week/30 Days Discharge Instructions: Moisten w/normal saline or sterile water; Cover wound as directed. Do not remove from wound bed. Secondary  Dressing: Zetuvit Plus Silicone Non-bordered 5x5 (in/in) 3 x Per Week/30 Days Compression Wrap: Medichoice 4 layer Compression System, 35-40 mmHG (Generic) 3 x Per Week/30 Days Discharge Instructions: Apply multi-layer wrap as directed. 1. I do believe getting back on the infusion should probably help that should be started back next week it sounds like. In the meantime I am can also go ahead and switch the dressing however to silver collagen we will use a little bit of the thin film of clobetasol first and the silver collagen to cover. 2. We will also get a continue with the 4-layer compression wrap along with the Zetuvit to catch any drainage. I think this is doing awesome. We will see patient back for reevaluation in 1 week here in the clinic. If anything worsens or changes patient will contact  our office for additional recommendations. Electronic Signature(s) Signed: 07/16/2021 10:29:59 AM By: Worthy Keeler PA-C Entered By: Worthy Keeler on 07/16/2021 10:29:59 JEOFFREY, ELEAZER ([id number]) -------------------------------------------------------------------------------- SuperBill Details Patient Name: [name] Date of Service: 07/16/2021 Medical Record Number: [id number] Patient Account Number: [id number] Date of Birth/Sex: 05/30/1980 (40 y.o. M) Treating RN: Levora Dredge Primary Care Provider: [name] Other Clinician: Referring Provider: [name] Treating Provider/Extender: [name] Cliche in Treatment: 240 Diagnosis Coding ICD-10 Codes Code Description I87.2 Venous insufficiency (chronic) (peripheral) L97.222 Non-pressure chronic ulcer of left calf with fat layer exposed E11.622 Type 2 diabetes mellitus with other skin ulcer L88 Pyoderma gangrenosum F17.208 Nicotine dependence, unspecified, with other nicotine-induced disorders Facility Procedures CPT4 Code: 29021115 Description: (Facility Use Only) [id number] - [facility]  LEG Modifier: Quantity: 1 Physician Procedures CPT4 Code: 3361224 Description: 49753 - WC PHYS LEVEL 4 - EST PT Modifier: Quantity: 1 CPT4 Code: Description: ICD-10 Diagnosis Description I87.2 Venous insufficiency (chronic) (peripheral) L97.222 Non-pressure chronic ulcer of left calf with fat layer exposed E11.622 Type 2 diabetes mellitus with other skin ulcer L88 Pyoderma gangrenosum Modifier: Quantity: Electronic Signature(s) Signed: 07/16/2021 10:30:21 AM By: Worthy Keeler PA-C Previous Signature: 07/16/2021 9:25:18 AM Version By: Levora Dredge Entered By: Worthy Keeler on 07/16/2021 10:30:20

## 2021-07-17 NOTE — Progress Notes (Signed)
TACOMA, MERIDA ([id number]) Visit Report for 07/16/2021 Arrival Information Details Patient Name: [name], [name] Date of Service: 07/16/2021 8:15 AM Medical Record Number: [id number] Patient Account Number: [id number] Date of Birth/Sex: 21-Jul-1980 (41 y.o. M) Treating RN: Levora Dredge Primary Care Marieclaire Bettenhausen: [name] Other Clinician: Referring Dryden Tapley: [name] Treating Matayah Reyburn/Extender: [name] Cliche in Treatment: 35 Visit Information History Since Last Visit Added or deleted any medications: No Patient Arrived: Ambulatory Any new allergies or adverse reactions: No Arrival Time: 08:03 Had a fall or experienced change in No Accompanied By: self activities of daily living that may affect Transfer Assistance: None risk of falls: Patient Requires Transmission-Based No Hospitalized since last visit: No Precautions: Has Dressing in Place as Prescribed: Yes Patient Has Alerts: Yes Has Compression in Place as Prescribed: Yes Patient Alerts: Patient has reaction Pain Present Now: No to silver dressings. Electronic Signature(s) Signed: 07/17/2021 4:37:03 PM By: Levora Dredge Entered By: Levora Dredge on 07/16/2021 08:07:51 [name], [name] ([id number]) -------------------------------------------------------------------------------- Clinic Level of Care Assessment Details Patient Name: [name], [name] Date of Service: 07/16/2021 8:15 AM Medical Record Number: [id number] Patient Account Number: [id number] Date of Birth/Sex: 09-19-1980 (41 y.o. M) Treating RN: Levora Dredge Primary Care Jinx Gilden: [name] Other Clinician: Referring Iriel Nason: [name] Treating Audwin Semper/Extender: [name] Cliche in Treatment: 240 Clinic Level of Care Assessment Items TOOL 1 Quantity Score []  - Use when EandM and Procedure is performed on INITIAL visit 0 ASSESSMENTS - Nursing Assessment / Reassessment []  - General Physical Exam (combine w/ comprehensive assessment  (listed just below) when performed on new 0 pt. evals) []  - 0 Comprehensive Assessment (HX, ROS, Risk Assessments, Wounds Hx, etc.) ASSESSMENTS - Wound and Skin Assessment / Reassessment []  - Dermatologic / Skin Assessment (not related to wound area) 0 ASSESSMENTS - Ostomy and/or Continence Assessment and Care []  - Incontinence Assessment and Management 0 []  - 0 Ostomy Care Assessment and Management (repouching, etc.) PROCESS - Coordination of Care []  - Simple Patient / Family Education for ongoing care 0 []  - 0 Complex (extensive) Patient / Family Education for ongoing care []  - 0 Staff obtains Programmer, systems, Records, Test Results / Process Orders []  - 0 Staff telephones HHA, Nursing Homes / Clarify orders / etc []  - 0 Routine Transfer to another Facility (non-emergent condition) []  - 0 Routine Hospital Admission (non-emergent condition) []  - 0 New Admissions / Biomedical engineer / Ordering NPWT, Apligraf, etc. []  - 0 Emergency Hospital Admission (emergent condition) PROCESS - Special Needs []  - Pediatric / Minor Patient Management 0 []  - 0 Isolation Patient Management []  - 0 Hearing / Language / Visual special needs []  - 0 Assessment of Community assistance (transportation, D/C planning, etc.) []  - 0 Additional assistance / Altered mentation []  - 0 Support Surface(s) Assessment (bed, cushion, seat, etc.) INTERVENTIONS - Miscellaneous []  - External ear exam 0 []  - 0 Patient Transfer (multiple staff / Civil Service fast streamer / Similar devices) []  - 0 Simple Staple / Suture removal (25 or less) []  - 0 Complex Staple / Suture removal (26 or more) []  - 0 Hypo/Hyperglycemic Management (do not check if billed separately) []  - 0 Ankle / Brachial Index (ABI) - do not check if billed separately Has the patient been seen at the hospital within the last three years: Yes Total Score: 0 Level Of Care: ____ [name] ([id number]) Electronic Signature(s) Signed: 07/17/2021 4:37:03 PM  By: Levora Dredge Entered By: Levora Dredge on 07/16/2021 09:25:06 [name], [name] ([id number]) -------------------------------------------------------------------------------- Compression Therapy Details Patient Name: [name] Date of  Service: 07/16/2021 8:15 AM Medical Record Number: [id number] Patient Account Number: [id number] Date of Birth/Sex: February 07, 1981 (40 y.o. M) Treating RN: Levora Dredge Primary Care Mung Rinker: [name] Other Clinician: Referring Giulian Goldring: [name] Treating Carynn Felling/Extender: [name] Cliche in Treatment: 240 Compression Therapy Performed for Wound Assessment: Wound #1 Left,Lateral Lower Leg Performed By: Clinician Levora Dredge, RN Compression Type: Four Layer Pre Treatment ABI: 1.2 Post Procedure Diagnosis Same as Pre-procedure Electronic Signature(s) Signed: 07/16/2021 9:24:10 AM By: Levora Dredge Entered By: Levora Dredge on 07/16/2021 09:24:10 [name], [name] ([id number]) -------------------------------------------------------------------------------- Encounter Discharge Information Details Patient Name: [name] Date of Service: 07/16/2021 8:15 AM Medical Record Number: [id number] Patient Account Number: [id number] Date of Birth/Sex: 1980/05/22 (40 y.o. M) Treating RN: Levora Dredge Primary Care Benito Lemmerman: [name] Other Clinician: Referring Cortland Crehan: [name] Treating Shiva Karis/Extender: [name] Cliche in Treatment: 240 Encounter Discharge Information Items Discharge Condition: Stable Ambulatory Status: Ambulatory Discharge Destination: Home Transportation: Private Auto Accompanied By: self Schedule Follow-up Appointment: Yes Clinical Summary of Care: Electronic Signature(s) Signed: 07/16/2021 9:26:25 AM By: Levora Dredge Entered By: Levora Dredge on 07/16/2021 09:26:25 [name] ([id number]) -------------------------------------------------------------------------------- Lower  Extremity Assessment Details Patient Name: [name] Date of Service: 07/16/2021 8:15 AM Medical Record Number: [id number] Patient Account Number: [id number] Date of Birth/Sex: 1980/12/17 (40 y.o. M) Treating RN: Levora Dredge Primary Care Jordin Vicencio: [name] Other Clinician: Referring Liliyana Thobe: [name] Treating Sieanna Vanstone/Extender: [name] Cliche in Treatment: 240 Edema Assessment Assessed: [Left: No] [Right: No] Edema: [Left: Ye] [Right: s] Calf Left: Right: Point of Measurement: 35 cm From Medial Instep 47.3 cm Ankle Left: Right: Point of Measurement: 10 cm From Medial Instep 29 cm Vascular Assessment Pulses: Dorsalis Pedis Palpable: [Left:Yes] Electronic Signature(s) Signed: 07/17/2021 4:37:03 PM By: Levora Dredge Entered By: Levora Dredge on 07/16/2021 08:17:20 [name] ([id number]) -------------------------------------------------------------------------------- Multi Wound Chart Details Patient Name: [name] Date of Service: 07/16/2021 8:15 AM Medical Record Number: [id number] Patient Account Number: [id number] Date of Birth/Sex: Sep 23, 1980 (40 y.o. M) Treating RN: Levora Dredge Primary Care Maziah Smola: [name] Other Clinician: Referring Modestine Scherzinger: [name] Treating Agustin Swatek/Extender: [name] Cliche in Treatment: 240 Vital Signs Height(in): 71 Pulse(bpm): 89 Weight(lbs): 338 Blood Pressure(mmHg): 133/86 Body Mass Index(BMI): 47.1 Temperature(F): 98.4 Respiratory Rate(breaths/min): 18 Photos: [N/A:N/A] Wound Location: Left, Lateral Lower Leg N/A N/A Wounding Event: Gradually Appeared N/A N/A Primary Etiology: Pyoderma N/A N/A Comorbid History: Sleep Apnea, Hypertension, Colitis N/A N/A Date Acquired: 11/18/2015 N/A N/A Weeks of Treatment: 240 N/A N/A Wound Status: Open N/A N/A Wound Recurrence: No N/A N/A Measurements L x W x D (cm) 2.6x2x0.1 N/A N/A Area (cm) : 4.084 N/A N/A Volume (cm) : 0.408 N/A N/A %  Reduction in Area: 16.80% N/A N/A % Reduction in Volume: 89.60% N/A N/A Classification: Full Thickness With Exposed N/A N/A Support Structures Exudate Amount: Medium N/A N/A Exudate Type: Serosanguineous N/A N/A Exudate Color: red, brown N/A N/A Wound Margin: Flat and Intact N/A N/A Granulation Amount: Large (67-100%) N/A N/A Granulation Quality: Red, Pink N/A N/A Necrotic Amount: Small (1-33%) N/A N/A Exposed Structures: Fat Layer (Subcutaneous Tissue): N/A N/A Yes Fascia: No Tendon: No Muscle: No Joint: No Bone: No Epithelialization: Medium (34-66%) N/A N/A Treatment Notes Electronic Signature(s) Signed: 07/17/2021 4:37:03 PM By: Levora Dredge Entered By: Levora Dredge on 07/16/2021 08:18:53 [name] ([id number]) -------------------------------------------------------------------------------- Multi-Disciplinary Care Plan Details Patient Name: [name] Date of Service: 07/16/2021 8:15 AM Medical Record Number: [id number] Patient Account Number: [id number] Date of Birth/Sex: [birth year] (40 y.o. M) Treating RN: Levora Dredge Primary Care  Mehek Grega: [name] Other Clinician: Referring Ineta Sinning: [name] Treating Minal Stuller/Extender: [name] Cliche in Treatment: [name] reviewed with physician Active Inactive Necrotic Tissue Nursing Diagnoses: Impaired tissue integrity related to necrotic/devitalized tissue Knowledge deficit related to management of necrotic/devitalized tissue Goals: Necrotic/devitalized tissue will be minimized in the wound bed Date Initiated: 03/26/2021 Target Resolution Date: 05/26/2021 Goal Status: Active Patient/caregiver will verbalize understanding of reason and process for debridement of necrotic tissue Date Initiated: 03/26/2021 Date Inactivated: 04/27/2021 Target Resolution Date: 03/26/2021 Goal Status: Met Interventions: Assess patient pain level pre-, during and post procedure and prior to  discharge Provide education on necrotic tissue and debridement process Treatment Activities: Apply topical anesthetic as ordered : 03/26/2021 Notes: Electronic Signature(s) Signed: 07/17/2021 4:37:03 PM By: Levora Dredge Entered By: Levora Dredge on 07/16/2021 08:18:44 [name] ([id number]) -------------------------------------------------------------------------------- Pain Assessment Details Patient Name: [name] Date of Service: 07/16/2021 8:15 AM Medical Record Number: [id number] Patient Account Number: [id number] Date of Birth/Sex: 1981-01-19 (40 y.o. M) Treating RN: Levora Dredge Primary Care Kalysta Kneisley: [name] Other Clinician: Referring Persia Lintner: [name] Treating Jontavia Leatherbury/Extender: [name] Cliche in Treatment: 240 Active Problems Location of Pain Severity and Description of Pain Patient Has Paino No Site Locations Rate the pain. Current Pain Level: 0 Pain Management and Medication Current Pain Management: Electronic Signature(s) Signed: 07/17/2021 4:37:03 PM By: Levora Dredge Entered By: Levora Dredge on 07/16/2021 08:08:36 [name], [name] ([id number]) -------------------------------------------------------------------------------- Patient/Caregiver Education Details Patient Name: [name] Date of Service: 07/16/2021 8:15 AM Medical Record Number: [id number] Patient Account Number: [id number] Date of Birth/Gender: 07/29/1980 (40 y.o. M) Treating RN: Levora Dredge Primary Care Physician: [name] Other Clinician: Referring Physician: [name] Treating Physician/Extender: [name] Cliche in Treatment: 240 Education Assessment Education Provided To: Patient Education Topics Provided Wound/Skin Impairment: Handouts: Caring for Your Ulcer Methods: Explain/Verbal Responses: State content correctly Electronic Signature(s) Signed: 07/17/2021 4:37:03 PM By: Levora Dredge Entered By: Levora Dredge on 07/16/2021  09:25:33 [name], [name] ([id number]) -------------------------------------------------------------------------------- Wound Assessment Details Patient Name: [name] Date of Service: 07/16/2021 8:15 AM Medical Record Number: [id number] Patient Account Number: [id number] Date of Birth/Sex: 1980-05-31 (40 y.o. M) Treating RN: Levora Dredge Primary Care Stuti Sandin: [name] Other Clinician: Referring Alenah Sarria: [name] Treating Undray Allman/Extender: [name] Cliche in Treatment: 240 Wound Status Wound Number: 1 Primary Etiology: Pyoderma Wound Location: Left, Lateral Lower Leg Wound Status: Open Wounding Event: Gradually Appeared Comorbid History: Sleep Apnea, Hypertension, Colitis Date Acquired: 11/18/2015 Weeks Of Treatment: 240 Clustered Wound: No Photos Wound Measurements Length: (cm) 2.6 Width: (cm) 2 Depth: (cm) 0.1 Area: (cm) 4.084 Volume: (cm) 0.408 % Reduction in Area: 16.8% % Reduction in Volume: 89.6% Epithelialization: Medium (34-66%) Tunneling: No Undermining: No Wound Description Classification: Full Thickness With Exposed Support Structures Wound Margin: Flat and Intact Exudate Amount: Medium Exudate Type: Serosanguineous Exudate Color: red, brown Foul Odor After Cleansing: No Slough/Fibrino Yes Wound Bed Granulation Amount: Large (67-100%) Exposed Structure Granulation Quality: Red, Pink Fascia Exposed: No Necrotic Amount: Small (1-33%) Fat Layer (Subcutaneous Tissue) Exposed: Yes Necrotic Quality: Adherent Slough Tendon Exposed: No Muscle Exposed: No Joint Exposed: No Bone Exposed: No Treatment Notes Wound #1 (Lower Leg) Wound Laterality: Left, Lateral Cleanser Wound Cleanser Discharge Instruction: Wash your hands with soap and water. Remove old dressing, discard into plastic bag and place into trash. Cleanse the wound with Wound Cleanser prior to applying a clean dressing using gauze sponges, not tissues or cotton balls. Do  not scrub or use excessive force. Pat dry using gauze sponges,  not tissue or cotton balls. [name], [name] ([id number]) Francisco Lotion Discharge Instruction: Suggestions: Theraderm, Eucerin, Cetaphil, or patient preference. Topical Clobetasol Propionate ointment 0.05%, 60 (g) tube Discharge Instruction: Pt to bring to appt- Triamcinolone Acetonide Cream, 0.1%, 15 (g) tube Discharge Instruction: Apply as directed by Savas Elvin. To leg not on wound Primary Dressing Promogran Matrix 4.34 (in) Discharge Instruction: Moisten w/normal saline or sterile water; Cover wound as directed. Do not remove from wound bed. Secondary Dressing Zetuvit Plus Silicone Non-bordered 5x5 (in/in) Secured With Compression Wrap Medichoice 4 layer Compression System, 35-40 mmHG Discharge Instruction: Apply multi-layer wrap as directed. Compression Stockings Add-Ons Electronic Signature(s) Signed: 07/17/2021 4:37:03 PM By: Levora Dredge Entered By: Levora Dredge on 07/16/2021 08:18:27 [name], [name] ([id number]) -------------------------------------------------------------------------------- Vitals Details Patient Name: [name] Date of Service: 07/16/2021 8:15 AM Medical Record Number: [id number] Patient Account Number: [id number] Date of Birth/Sex: 05-23-1980 (40 y.o. M) Treating RN: Levora Dredge Primary Care Jaree Trinka: [name] Other Clinician: Referring Calan Doren: [name] Treating Annessa Satre/Extender: [name] Cliche in Treatment: 240 Vital Signs Time Taken: 08:07 Temperature (F): 98.4 Height (in): 71 Pulse (bpm): 76 Weight (lbs): 338 Respiratory Rate (breaths/min): 18 Body Mass Index (BMI): 47.1 Blood Pressure (mmHg): 133/86 Reference Range: 80 - 120 mg / dl Electronic Signature(s) Signed: 07/17/2021 4:37:03 PM By: Levora Dredge Entered By: Levora Dredge on 07/16/2021 08:08:17

## 2021-07-18 ENCOUNTER — Other Ambulatory Visit: Payer: Self-pay

## 2021-07-18 ENCOUNTER — Encounter: Payer: 59 | Attending: Physician Assistant

## 2021-07-18 DIAGNOSIS — F17208 Nicotine dependence, unspecified, with other nicotine-induced disorders: Secondary | ICD-10-CM | POA: Insufficient documentation

## 2021-07-18 DIAGNOSIS — I872 Venous insufficiency (chronic) (peripheral): Secondary | ICD-10-CM | POA: Diagnosis present

## 2021-07-18 DIAGNOSIS — L88 Pyoderma gangrenosum: Secondary | ICD-10-CM | POA: Diagnosis not present

## 2021-07-18 DIAGNOSIS — L97222 Non-pressure chronic ulcer of left calf with fat layer exposed: Secondary | ICD-10-CM | POA: Diagnosis not present

## 2021-07-18 DIAGNOSIS — E11622 Type 2 diabetes mellitus with other skin ulcer: Secondary | ICD-10-CM | POA: Insufficient documentation

## 2021-07-18 NOTE — Progress Notes (Signed)
CORNELIS, KLUVER ([id number]) ?Visit Report for 07/18/2021 ?Arrival Information Details ?Patient Name: ARTHER, HEISLER ?Date of Service: 07/18/2021 8:00 AM ?Medical Record Number: [id number] ?Patient Account Number: [id number] ?Date of Birth/Sex: 09-Dec-1980 (41 y.o. M) ?Treating RN: Donnamarie Poag ?Primary Care Tashaun Obey: [name] Other Clinician: ?Referring Ediberto Sens: [name] ?Treating Leota Maka/Extender: Jeri Cos ?Weeks in Treatment: 241 ?Visit Information History Since Last Visit ?Added or deleted any medications: No ?Patient Arrived: Ambulatory ?Had a fall or experienced change in No ?Arrival Time: 08:09 ?activities of daily living that may affect ?Accompanied By: self ?risk of falls: ?Transfer Assistance: None ?Hospitalized since last visit: No ?Patient Identification Verified: Yes ?Has Dressing in Place as Prescribed: Yes ?Secondary Verification Process Completed: Yes ?Has Compression in Place as Prescribed: Yes ?Patient Requires Transmission-Based No ?Pain Present Now: No ?Precautions: ?Patient Has Alerts: Yes ?Patient Alerts: Patient has reaction ?to ?silver dressings. ?Electronic Signature(s) ?Signed: 07/18/2021 3:05:53 PM By: Donnamarie Poag ?Entered ByDonnamarie Poag on 07/18/2021 08:27:14 ?TREYON, WYMORE ([id number]) ?-------------------------------------------------------------------------------- ?Encounter Discharge Information Details ?Patient Name: HERVEY, WEDIG ?Date of Service: 07/18/2021 8:00 AM ?Medical Record Number: [id number] ?Patient Account Number: [id number] ?Date of Birth/Sex: [birth year] (41 y.o. M) ?Treating RN: Donnamarie Poag ?Primary Care Najae Filsaime: [name] Other Clinician: ?Referring Malin Sambrano: [name] ?Treating Ashlley Booher/Extender: Jeri Cos ?Weeks in Treatment: 241 ?Encounter Discharge Information Items ?Discharge Condition: Stable ?Ambulatory Status: Ambulatory ?Discharge Destination: Home ?Transportation: Private Auto ?Accompanied By: self ?Schedule Follow-up Appointment:  Yes ?Clinical Summary of Care: ?Electronic Signature(s) ?Signed: 07/18/2021 3:05:53 PM By: Donnamarie Poag ?Entered ByDonnamarie Poag on 07/18/2021 08:29:45 ?TYRIEK, HOFMAN ([id number]) ?-------------------------------------------------------------------------------- ?Wound Assessment Details ?Patient Name: [name], [name] ?Date of Service: 07/18/2021 8:00 AM ?Medical Record Number: [id number] ?Patient Account Number: [id number] ?Date of Birth/Sex: Oct 03, 1980 (41 y.o. M) ?Treating RN: Donnamarie Poag ?Primary Care Maneh Sieben: [name] Other Clinician: ?Referring Aveya Beal: [name] ?Treating Burgundy Matuszak/Extender: Jeri Cos ?Weeks in Treatment: 241 ?Wound Status ?Wound Number: 1 ?Primary Etiology: Pyoderma ?Wound Location: Left, Lateral Lower Leg ?Wound Status: Open ?Wounding Event: Gradually Appeared ?Comorbid History: Sleep Apnea, Hypertension, Colitis ?Date Acquired: 11/18/2015 ?Weeks Of Treatment: 241 ?Clustered Wound: No ?Wound Measurements ?Length: (cm) 2.6 ?Width: (cm) 2 ?Depth: (cm) 0.1 ?Area: (cm?) 4.084 ?Volume: (cm?) 0.408 ?% Reduction in Area: 16.8% ?% Reduction in Volume: 89.6% ?Epithelialization: Medium (34-66%) ?Wound Description ?Classification: Full Thickness With Exposed Support Structures ?Wound Margin: Flat and Intact ?Exudate Amount: Medium ?Exudate Type: Serosanguineous ?Exudate Color: red, brown ?Foul Odor After Cleansing: No ?Slough/Fibrino Yes ?Wound Bed ?Granulation Amount: Large (67-100%) Exposed Structure ?Granulation Quality: Red, Pink ?Fascia Exposed: No ?Necrotic Amount: Small (1-33%) ?Fat Layer (Subcutaneous Tissue) Exposed: Yes ?Necrotic Quality: Adherent Slough ?Tendon Exposed: No ?Muscle Exposed: No ?Joint Exposed: No ?Bone Exposed: No ?Treatment Notes ?Wound #1 (Lower Leg) Wound Laterality: Left, Lateral ?Cleanser ?Wound Cleanser ?Discharge Instruction: Wash your hands with soap and water. Remove old dressing, discard into plastic bag and place into trash. ?Cleanse the wound with Wound  Cleanser prior to applying a clean dressing using gauze sponges, not tissues or cotton balls. Do ?not scrub or use excessive force. Pat dry using gauze sponges, not tissue or cotton balls. ?Peri-Wound Care ?Moisturizing Lotion ?Discharge Instruction: Suggestions: Theraderm, Eucerin, Cetaphil, or patient preference. ?Topical ?Clobetasol Propionate ointment 0.05%, 60 (g) tube ?Discharge Instruction: Pt to bring to appt- ?Triamcinolone Acetonide Cream, 0.1%, 15 (g) tube ?Discharge Instruction: Apply as directed by Edom Schmuhl. To leg not on wound ?Primary Dressing ?Promogran Matrix 4.34 (in) ?Discharge Instruction: Moisten w/normal saline or sterile water; Cover wound as directed. Do not remove from wound  bed. ?ARTIE, MCINTYRE ([id number]) ?Secondary Dressing ?Zetuvit Plus Silicone Non-bordered 5x5 (in/in) ?Secured With ?Compression Wrap ?Medichoice 4 layer Compression System, 35-40 mmHG ?Discharge Instruction: Apply multi-layer wrap as directed. ?Compression Stockings ?Add-Ons ?Electronic Signature(s) ?Signed: 07/18/2021 3:05:53 PM By: Donnamarie Poag ?Entered ByDonnamarie Poag on 07/18/2021 08:27:36 ?

## 2021-07-19 NOTE — Progress Notes (Signed)
BLADEN, UMAR ([id number]) ?Visit Report for 07/18/2021 ?Physician Orders Details ?Patient Name: [name], [name] ?Date of Service: 07/18/2021 8:00 AM ?Medical Record Number: [id number] ?Patient Account Number: [id number] ?Date of Birth/Sex: Dec 12, 1980 (41 y.o. M) ?Treating RN: Donnamarie Poag ?Primary Care Provider: [name] Other Clinician: ?Referring Provider: [name] ?Treating Provider/Extender: Jeri Cos ?Weeks in Treatment: 241 ?Verbal / Phone Orders: No ?Diagnosis Coding ?Follow-up Appointments ?o Return Appointment in 1 week. ?o Nurse Visit as needed - twice a week ?Bathing/ Shower/ Hygiene ?o Clean wound with Normal Saline or wound cleanser. ?o May shower with wound dressing protected with water repellent cover or cast protector. ?Edema Control - Lymphedema / Segmental Compressive Device / Other ?o Optional: One layer of unna paste to top of compression wrap (to act as an anchor). ?o Elevate, Exercise Daily and Avoid Standing for Long Periods of Time. ?o Elevate legs to the level of the heart and pump ankles as often as possible ?o Elevate leg(s) parallel to the floor when sitting. ?Wound Treatment ?Wound #1 - Lower Leg Wound Laterality: Left, Lateral ?Cleanser: Wound Cleanser 3 x Per Week/30 Days ?Discharge Instructions: Wash your hands with soap and water. Remove old dressing, discard into plastic bag and place into trash. ?Cleanse the wound with Wound Cleanser prior to applying a clean dressing using gauze sponges, not tissues or cotton balls. Do not ?scrub or use excessive force. Pat dry using gauze sponges, not tissue or cotton balls. ?Peri-Wound Care: Moisturizing Lotion 3 x Per Week/30 Days ?Discharge Instructions: Suggestions: Theraderm, Eucerin, Cetaphil, or patient preference. ?Topical: Clobetasol Propionate ointment 0.05%, 60 (g) tube 3 x Per Week/30 Days ?Discharge Instructions: Pt to bring to appt- ?Topical: Triamcinolone Acetonide Cream, 0.1%, 15 (g) tube 3 x Per Week/30  Days ?Discharge Instructions: Apply as directed by provider. To leg not on wound ?Primary Dressing: Promogran Matrix 4.34 (in) 3 x Per Week/30 Days ?Discharge Instructions: Moisten w/normal saline or sterile water; Cover wound as directed. Do not remove from wound bed. ?Secondary Dressing: Zetuvit Plus Silicone Non-bordered 5x5 (in/in) 3 x Per Week/30 Days ?Compression Wrap: Medichoice 4 layer Compression System, 35-40 mmHG (Generic) 3 x Per Week/30 Days ?Discharge Instructions: Apply multi-layer wrap as directed. ?Electronic Signature(s) ?Signed: 07/18/2021 3:05:53 PM By: Donnamarie Poag ?Signed: 07/19/2021 5:22:07 PM By: Worthy Keeler PA-C ?Entered ByDonnamarie Poag on 07/18/2021 89:38:10 ?TIMMIE, DUGUE ([id number]) ?-------------------------------------------------------------------------------- ?SuperBill Details ?Patient Name: KENSTON, LONGTON ?Date of Service: 07/18/2021 ?Medical Record Number: [id number] ?Patient Account Number: [id number] ?Date of Birth/Sex: 1980/12/26 (41 y.o. 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

## 2021-07-20 ENCOUNTER — Other Ambulatory Visit: Payer: Self-pay

## 2021-07-20 DIAGNOSIS — I872 Venous insufficiency (chronic) (peripheral): Secondary | ICD-10-CM | POA: Diagnosis not present

## 2021-07-20 NOTE — Progress Notes (Signed)
[name] ([id number]) ?Visit Report for 07/20/2021 ?Physician Orders Details ?Patient Name: [name], [name] ?Date of Service: 07/20/2021 8:00 AM ?Medical Record Number: [id number] ?Patient Account Number: [id number] ?Date of Birth/Sex: [birth year] (41 y.o. M) ?Treating RN: Donnamarie Poag ?Primary Care Provider: [name] Other Clinician: ?Referring Provider: [name] ?Treating Provider/Extender: Jeri Cos ?Weeks in Treatment: 241 ?Verbal / Phone Orders: No ?Diagnosis Coding ?Follow-up Appointments ?o Return Appointment in 1 week. ?o Nurse Visit as needed - twice a week ?Bathing/ Shower/ Hygiene ?o Clean wound with Normal Saline or wound cleanser. ?o May shower with wound dressing protected with water repellent cover or cast protector. ?Edema Control - Lymphedema / Segmental Compressive Device / Other ?o Optional: One layer of unna paste to top of compression wrap (to act as an anchor). ?o Elevate, Exercise Daily and Avoid Standing for Long Periods of Time. ?o Elevate legs to the level of the heart and pump ankles as often as possible ?o Elevate leg(s) parallel to the floor when sitting. ?Wound Treatment ?Wound #1 - Lower Leg Wound Laterality: Left, Lateral ?Cleanser: Wound Cleanser 3 x Per Week/30 Days ?Discharge Instructions: Wash your hands with soap and water. Remove old dressing, discard into plastic bag and place into trash. ?Cleanse the wound with Wound Cleanser prior to applying a clean dressing using gauze sponges, not tissues or cotton balls. Do not ?scrub or use excessive force. Pat dry using gauze sponges, not tissue or cotton balls. ?Peri-Wound Care: Moisturizing Lotion 3 x Per Week/30 Days ?Discharge Instructions: Suggestions: Theraderm, Eucerin, Cetaphil, or patient preference. ?Topical: Clobetasol Propionate ointment 0.05%, 60 (g) tube 3 x Per Week/30 Days ?Discharge Instructions: Pt to bring to appt- ?Topical: Triamcinolone Acetonide Cream, 0.1%, 15 (g) tube 3 x Per Week/30  Days ?Discharge Instructions: Apply as directed by provider. To leg not on wound ?Primary Dressing: Promogran Matrix 4.34 (in) 3 x Per Week/30 Days ?Discharge Instructions: Moisten w/normal saline or sterile water; Cover wound as directed. Do not remove from wound bed. ?Secondary Dressing: Zetuvit Plus Silicone Non-bordered 5x5 (in/in) 3 x Per Week/30 Days ?Compression Wrap: Medichoice 4 layer Compression System, 35-40 mmHG (Generic) 3 x Per Week/30 Days ?Discharge Instructions: Apply multi-layer wrap as directed. ?Electronic Signature(s) ?Signed: 07/20/2021 3:48:34 PM By: Donnamarie Poag ?Signed: 07/20/2021 5:14:00 PM By: Worthy Keeler PA-C ?Entered ByDonnamarie Poag on 07/20/2021 08:22:57 ?[name] ([id number]) ?-------------------------------------------------------------------------------- ?SuperBill Details ?Patient Name: [name], [name] ?Date of Service: 07/20/2021 ?Medical Record Number: [id number] ?Patient Account Number: [id number] ?Date of Birth/Sex: 02/16/1981 (41 y.o. M) ?Treating RN: Donnamarie Poag ?Primary Care Provider: [name] Other Clinician: ?Referring Provider: [name] ?Treating Provider/Extender: Jeri Cos ?Weeks in Treatment: 241 ?Diagnosis Coding ?ICD-10 Codes ?Code Description ?I87.2 Venous insufficiency (chronic) (peripheral) ?S16.837 Non-pressure chronic ulcer of left calf with fat layer exposed ?E11.622 Type 2 diabetes mellitus with other skin ulcer ?L88 Pyoderma gangrenosum ?F17.208 Nicotine dependence, unspecified, with other nicotine-induced disorders ?Facility Procedures ?CPT4 Code: 29021115 ?Description: (Facility Use Only) [phone] - APPLY MULTLAY COMPRS LWR LT LEG ?Modifier: ?Quantity: 1 ?Electronic Signature(s) ?Signed: 07/20/2021 3:48:34 PM By: Donnamarie Poag ?Signed: 07/20/2021 5:14:00 PM By: Worthy Keeler PA-C ?Entered ByDonnamarie Poag on 07/20/2021 08:23:56 ?

## 2021-07-20 NOTE — Progress Notes (Signed)
CONNAR, KEATING ([id number]) ?Visit Report for 07/20/2021 ?Arrival Information Details ?Patient Name: MAXAMILLIAN, TIENDA ?Date of Service: 07/20/2021 8:00 AM ?Medical Record Number: [id number] ?Patient Account Number: [id number] ?Date of Birth/Sex: [birth year] (41 y.o. M) ?Treating RN: Donnamarie Poag ?Primary Care Tecora Eustache: [name] Other Clinician: ?Referring Lea Walbert: [name] ?Treating Braylin Formby/Extender: Jeri Cos ?Weeks in Treatment: 241 ?Visit Information History Since Last Visit ?All ordered tests and consults were completed: Yes ?Patient Arrived: Ambulatory ?Added or deleted any medications: No ?Arrival Time: 08:03 ?Had a fall or experienced change in No ?Accompanied By: self ?activities of daily living that may affect ?Transfer Assistance: None ?risk of falls: ?Patient Identification Verified: Yes ?Hospitalized since last visit: No ?Secondary Verification Process Completed: Yes ?Has Dressing in Place as Prescribed: Yes ?Patient Requires Transmission-Based No ?Has Compression in Place as Prescribed: Yes ?Precautions: ?Pain Present Now: No ?Patient Has Alerts: Yes ?Patient Alerts: Patient has reaction ?to ?silver dressings. ?Electronic Signature(s) ?Signed: 07/20/2021 3:48:34 PM By: Donnamarie Poag ?Entered ByDonnamarie Poag on 07/20/2021 08:21:48 ?ENMANUEL, ZUFALL ([id number]) ?-------------------------------------------------------------------------------- ?Compression Therapy Details ?Patient Name: AIDAN, MOTEN ?Date of Service: 07/20/2021 8:00 AM ?Medical Record Number: [id number] ?Patient Account Number: [id number] ?Date of Birth/Sex: 24-Jun-1980 (41 y.o. M) ?Treating RN: Donnamarie Poag ?Primary Care Doreene Forrey: [name] Other Clinician: ?Referring Shaterria Sager: [name] ?Treating Aili Casillas/Extender: Jeri Cos ?Weeks in Treatment: 241 ?Compression Therapy Performed for Wound Assessment: Wound #1 Left,Lateral Lower Leg ?Performed By: Clinician Donnamarie Poag, RN ?Compression Type: Four Layer ?Electronic  Signature(s) ?Signed: 07/20/2021 3:48:34 PM By: Donnamarie Poag ?Entered ByDonnamarie Poag on 07/20/2021 08:22:28 ?TED, GOODNER ([id number]) ?-------------------------------------------------------------------------------- ?Encounter Discharge Information Details ?Patient Name: DELSIN, COPEN ?Date of Service: 07/20/2021 8:00 AM ?Medical Record Number: [id number] ?Patient Account Number: [id number] ?Date of Birth/Sex: 05-28-1980 (41 y.o. M) ?Treating RN: Donnamarie Poag ?Primary Care Karista Aispuro: [name] Other Clinician: ?Referring Davien Malone: [name] ?Treating Anjalee Cope/Extender: Jeri Cos ?Weeks in Treatment: 241 ?Encounter Discharge Information Items ?Discharge Condition: Stable ?Ambulatory Status: Ambulatory ?Discharge Destination: Home ?Transportation: Private Auto ?Accompanied By: self ?Schedule Follow-up Appointment: Yes ?Clinical Summary of Care: ?Electronic Signature(s) ?Signed: 07/20/2021 3:48:34 PM By: Donnamarie Poag ?Entered ByDonnamarie Poag on 07/20/2021 08:23:42 ?REHAAN, VILORIA ([id number]) ?-------------------------------------------------------------------------------- ?Wound Assessment Details ?Patient Name: MARKIAN, GLOCKNER ?Date of Service: 07/20/2021 8:00 AM ?Medical Record Number: [id number] ?Patient Account Number: [id number] ?Date of Birth/Sex: August 19, 1980 (41 y.o. M) ?Treating RN: Donnamarie Poag ?Primary Care Wetzel Meester: [name] Other Clinician: ?Referring Abhimanyu Cruces: [name] ?Treating Hosey Burmester/Extender: Jeri Cos ?Weeks in Treatment: 241 ?Wound Status ?Wound Number: 1 ?Primary Etiology: Pyoderma ?Wound Location: Left, Lateral Lower Leg ?Wound Status: Open ?Wounding Event: Gradually Appeared ?Comorbid History: Sleep Apnea, Hypertension, Colitis ?Date Acquired: 11/18/2015 ?Weeks Of Treatment: 241 ?Clustered Wound: No ?Wound Measurements ?Length: (cm) 2.6 ?Width: (cm) 2 ?Depth: (cm) 0.1 ?Area: (cm?) 4.084 ?Volume: (cm?) 0.408 ?% Reduction in Area: 16.8% ?% Reduction in Volume: 89.6% ?Epithelialization:  Medium (34-66%) ?Wound Description ?Classification: Full Thickness With Exposed Support Structures ?Wound Margin: Flat and Intact ?Exudate Amount: Medium ?Exudate Type: Serosanguineous ?Exudate Color: red, brown ?Foul Odor After Cleansing: No ?Slough/Fibrino Yes ?Wound Bed ?Granulation Amount: Large (67-100%) Exposed Structure ?Granulation Quality: Red, Pink ?Fascia Exposed: No ?Necrotic Amount: Small (1-33%) ?Fat Layer (Subcutaneous Tissue) Exposed: Yes ?Necrotic Quality: Adherent Slough ?Tendon Exposed: No ?Muscle Exposed: No ?Joint Exposed: No ?Bone Exposed: No ?Treatment Notes ?Wound #1 (Lower Leg) Wound Laterality: Left, Lateral ?Cleanser ?Wound Cleanser ?Discharge Instruction: Wash your hands with soap and water. Remove old dressing, discard into plastic bag and place into trash. ?Cleanse the wound with Wound Cleanser prior  to applying a clean dressing using gauze sponges, not tissues or cotton balls. Do ?not scrub or use excessive force. Pat dry using gauze sponges, not tissue or cotton balls. ?Peri-Wound Care ?Moisturizing Lotion ?Discharge Instruction: Suggestions: Theraderm, Eucerin, Cetaphil, or patient preference. ?Topical ?Clobetasol Propionate ointment 0.05%, 60 (g) tube ?Discharge Instruction: Pt to bring to appt- ?Triamcinolone Acetonide Cream, 0.1%, 15 (g) tube ?Discharge Instruction: Apply as directed by Khaleel Beckom. To leg not on wound ?Primary Dressing ?Promogran Matrix 4.34 (in) ?Discharge Instruction: Moisten w/normal saline or sterile water; Cover wound as directed. Do not remove from wound bed. ?COMPTON, BRIGANCE ([id number]) ?Secondary Dressing ?Zetuvit Plus Silicone Non-bordered 5x5 (in/in) ?Secured With ?Compression Wrap ?Medichoice 4 layer Compression System, 35-40 mmHG ?Discharge Instruction: Apply multi-layer wrap as directed. ?Compression Stockings ?Add-Ons ?Electronic Signature(s) ?Signed: 07/20/2021 3:48:34 PM By: Donnamarie Poag ?Entered ByDonnamarie Poag on 07/20/2021 08:22:12 ?

## 2021-07-23 ENCOUNTER — Other Ambulatory Visit
Admission: RE | Admit: 2021-07-23 | Discharge: 2021-07-23 | Disposition: A | Discharge status: Home / Self Care | Payer: 59 | Source: Ambulatory Visit | Attending: Physician Assistant | Admitting: Physician Assistant

## 2021-07-23 ENCOUNTER — Encounter: Payer: 59 | Admitting: Physician Assistant

## 2021-07-23 ENCOUNTER — Other Ambulatory Visit: Payer: Self-pay

## 2021-07-23 DIAGNOSIS — I872 Venous insufficiency (chronic) (peripheral): Secondary | ICD-10-CM | POA: Diagnosis not present

## 2021-07-23 DIAGNOSIS — B999 Unspecified infectious disease: Secondary | ICD-10-CM | POA: Diagnosis not present

## 2021-07-23 NOTE — Progress Notes (Addendum)
LANNY, LIPKIN ([id number]) Visit Report for 07/23/2021 Chief Complaint Document Details Patient Name: [name], [name] Date of Service: 07/23/2021 8:15 AM Medical Record Number: [id number] Patient Account Number: [id number] Date of Birth/Sex: 03/10/1981 (41 y.o. 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 M) Treating RN: Levora Dredge Primary Care Provider: [name] Other Clinician: Referring Provider: [name] Treating Provider/Extender: [name] Cliche in Treatment: 70 History of Present Illness HPI Description: 12/04/16; 41 year old man who comes into the clinic today for review of a wound on the posterior left calf. He tells me that is been there for about a year. He is not a diabetic he does smoke half a pack per day. He was seen in the ER on 11/20/16 felt to have cellulitis around the wound and was given clindamycin. An x-ray did not show osteomyelitis. The patient initially tells me that he has a milk allergy that sets off a pruritic itching rash on his lower legs which she scratches incessantly and he thinks that's what may have set up the wound. He has been using various topical antibiotics and ointments without any effect. He works in a [employer] and is on his feet all day. He does not have a  prior history of wounds however he does have the rash on both lower legs the right arm and the ventral aspect of his left arm. These are excoriations and clearly have had scratching however there are of macular looking areas on both legs including a substantial larger area on the right leg. This does not have an underlying open area. There is no blistering. The patient tells me that 2 years ago in [place] in response to the rash on his legs he saw a dermatologist who told him he had a condition which may be pyoderma gangrenosum although I may be putting words into his mouth. He seemed to recognize this. On further questioning he admits to a 5 year history of quiesced. ulcerative colitis. He is not in any treatment for this. He's had no recent travel 12/11/16; the patient arrives today with his wound and roughly the same condition we've been using silver alginate this is a deep punched out wound with some surrounding erythema but no tenderness. Biopsy I did did not show confirmed pyoderma gangrenosum suggested nonspecific inflammation and vasculitis but does not provide an actual description of what was seen by the pathologist. I'm really not able to understand this We have also received information from the patient's dermatologist in [place] notes from April 2016. This was a doctor Agarwal-antal. 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 He was prescribed topical steroid high potency under occlusion which helped but at this point the patient did not have a deep punched out wound. 12/18/16; the patient's wound is larger in terms of surface area however this surface looks better and there is less depth. The surrounding erythema also is better. The patient states that the wrap we put on came off 2 days ago when he has been using his compression stockings.  He we are in the process of getting a dermatology consult. 12/26/16 on evaluation today patient's left lower extremity wound shows evidence of  infection with surrounding erythema noted. He has been tolerating the dressing changes but states that he has noted more discomfort. There is a larger area of erythema surrounding the wound. No fevers, chills, nausea, or vomiting noted at this time. With that being said the wound still does have slough covering the surface. He is not allergic to any medication that he is aware of at this point. In regard to his right lower extremity he had several regions that are erythematous and pruritic he wonders if there's anything we can do to help that. 01/02/17 I reviewed patient's wound culture which was obtained his visit last week. He was placed on doxycycline at that point. Unfortunately that does not appear to be an antibiotic that would likely help with the situation however the pseudomonas noted on culture is sensitive to Cipro. Also unfortunately patient's wound seems to have a large compared to last week's evaluation. Not severely so but there are definitely increased measurements in general. He is continuing to have discomfort as well he writes this to be a seven out of 10. In fact he would prefer me not to perform any debridement today due to the fact that he is having discomfort and considering he has an active infection on the little reluctant to do so anyway. No fevers, chills, nausea, or vomiting noted at this time. 01/08/17; patient seems dermatology on September 5. I suspect dermatology will want the slides from the biopsy I did sent to their pathologist. I'm not sure if there is a way we can expedite that. In any case the culture I did before I left on vacation 3 weeks ago showed Pseudomonas he was given 10 days of Cipro and per her description of her intake nurses is actually somewhat better this week although the wound is quite a bit bigger than I remember the last time I saw this. He still has 3 more days of Cipro 01/21/17; dermatology appointment tomorrow. He has completed the ciprofloxacin  for Pseudomonas. Surface of the wound looks better however he is had some deterioration in the lesions on his right leg. Meantime the left lateral leg wound we will continue with sample 01/29/17; patient had his dermatology appointment but I can't yet see that note. He is completed his antibiotics. The wound is more superficial but considerably larger in circumferential area than when he came in. This is in his left lateral calf. He also has swollen erythematous areas with superficial wounds on the right leg and small papular areas on both arms. There apparently areas in her his upper thighs and buttocks I did not look at those. Dermatology biopsied the right leg. Hopefully will have their input next week. 02/05/17; patient went back to see his dermatologist who told him that he had a "scratching problem" as well as staph. He is now on a 30 day course of doxycycline and I believe she gave him triamcinolone cream to the right leg areas to help with the itching [not exactly sure but probably triamcinolone]. She apparently looked at the left lateral leg wound although this was not rebiopsied and I think felt to be ultimately part of the same pathogenesis. He is using sample border foam and changing nevus himself. He now has a new open area on the right posterior leg which was his biopsy site I don't have any of the dermatology notes  02/12/17; we put the patient in compression last week with SANTYL to the wound on the left leg and the biopsy. Edema is much better and the depth of the wound is now at level of skin. Area is still the same oBiopsy site on the right lateral leg we've also been using santyl with a border foam dressing and he is changing this himself. 02/19/17; Using silver alginate started last week to both the substantial left leg wound and the biopsy site on the right wound. He is tolerating compression well. Has a an appointment with his primary M.D. tomorrow wondering about diuretics although  I'm wondering if the edema problem is actually lymphedema 02/26/17; the patient has been to see his primary doctor Dr. Jerrel Ivory at [facility] our primary care. She started him on Lasix 20 mg and this seems to have helped with the edema. However we are not making substantial change with the left lateral calf wound and inflammation. The biopsy site on the right leg also looks stable but not really all that different. 03/12/17; the patient has been to see vein and vascular Dr. Lucky Cowboy. He has had venous reflux studies I have not reviewed these. I did get a call from his dermatology office. They felt that he might have pathergy based on their biopsy on his right leg which led them to look at the slides of [name], [name] ([id number]) the biopsy I did on the left leg and they wonder whether this represents pyoderma gangrenosum which was the original supposition in a man with ulcerative colitis albeit inactive for many years. They therefore recommended clobetasol and tetracycline i.e. aggressive treatment for possible pyoderma gangrenosum. 03/26/17; apparently the patient just had reflux studies not an appointment with Dr. dew. She arrives in clinic today having applied clobetasol for 2-3 weeks. He notes over the last 2-3 days excessive drainage having to change the dressing 3-4 times a day and also expanding erythema. 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 Lillia Carmel at [facility] dermatology locally who kindly saw him at my request. 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 My concern was the degree of erythema medially extending from his left leg wound which was either inflammation from pyoderma  or cellulitis. I put him on Augmentin however culture of the wound showed Pseudomonas which is quinolone sensitive. I really don't believe he has cellulitis however in view of everything I will continue and give him a course of Cipro. He is also on doxycycline as an immune modulator for the pyoderma. In addition to his original wound on the left lateral leg with surrounding erythema he has a wound on the right posterior calf which was an original biopsy site done by dermatology. This was felt to represent pathergy from pyoderma gangrenosum 04/16/17; pyoderma gangrenosum. Saw Dr. Lillia Carmel yesterday. He has been using topical antibiotics to both wound areas his original wound on the left and the biopsies/pathergy area on the right. There is definitely some improvement in the inflammation around the wound on the right although the patient states he has increasing sensitivity of the wounds. He is on prednisone 60 and doxycycline 1 as prescribed by Dr. Lillia Carmel. He is covering the topical antibiotic with gauze  and putting this in his own compression stocks and changing this daily. He states that Dr. Lottie Rater did a culture of the left leg wound yesterday 05/07/17; pyoderma gangrenosum. The patient saw Dr. Lillia Carmel yesterday and has a follow-up with her in one month. He is still using topical antibiotics to both wounds although he can't recall exactly what type. He is still on prednisone 60 mg. Dr. Lillia Carmel stated that the doxycycline could stop if we were in agreement. 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 The right medial leg was induced by biopsy/pathergy. The area on the right is essentially healed. Still on high-dose prednisone using topical antibiotics to the wound 07/09/17; pyoderma gangrenosum with wounds on the left lateral leg. The right medial leg has closed and remains closed. He  is still on prednisone 60. oHe tells me he missed his last dermatology appointment with Dr. Lillia Carmel but will make another appointment. He reports that her blood sugar at a recent screen in [place] was high 200's. He was 180 today. 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 I think he is going to require still much longer prednisone perhaps another 3 months before attempting to taper. In the meantime his wound is a lot better. Smaller. He is cleaning this off daily and applying topical antibiotics. When he was last in the clinic I thought about changing to [facility] and actually put in a couple of calls to dermatology although probably not during their business hours. 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 He continues on oral prednisone. He has been using triple antibiotic ointment. There is surface debris and we will transition to [facility] and have him return in 2 weeks. He has lost 30 pounds since his last appointment with lifestyle modification. He may benefit from topical steroid cream for treatment this can be considered at a later date. 08/22/17 on evaluation today patient appears to actually be doing rather well in regard to his left lateral lower extremity ulcer. He has actually been managed by Dr. Dellia Nims most recently. Patient is currently on oral steroids at this time. This seems to have been of benefit for him. Nonetheless his last visit was actually with Leah on 08/06/17. Currently he is not utilizing any topical steroid creams although this could be of benefit as well. No fevers, chills, nausea, or vomiting noted at this time. 09/05/17 on evaluation today patient appears to be doing better in regard to his left lateral lower extremity ulcer. He has been tolerating the dressing changes without complication. He is using Santyl with good effect. Overall I'm  very pleased with how things are standing at this point. Patient likewise is happy that this is doing better. 09/19/17 on evaluation today patient actually appears to be doing rather well in regard to his left lateral lower extremity ulcer. Again this is secondary to Pyoderma gangrenosum and he seems to be progressing well with the Santyl which is good news. He's not having any significant pain. 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 He has been tolerating the Santyl without complication and in general I feel like he's making good progress. 10/17/17 on evaluation today patient  appears to be doing very well in regard to his left lateral lower surety ulcer. He has been tolerating the dressing changes without complication. There does not appear to be any evidence of infection he's alternating the Santyl and the triple antibiotic ointment every other day this seems to be doing well for him. 11/03/17 on evaluation today patient appears to be doing very well in regard to his left lateral lower extremity ulcer. He is been tolerating the dressing changes without complication which is good news. Fortunately there does not appear to be any evidence of infection which is also great news. Overall is doing excellent they are starting to taper down on the prednisone is down to 40 mg at this point it also started topical clobetasol for him. 11/17/17 on evaluation today patient appears to be doing well in regard to his left lateral lower surety ulcer. He's been tolerating the dressing changes without complication. He does note that he is having no pain, no excessive drainage or discharge, and overall he feels like things are going about how he would expect and hope they would. Overall he seems to have no evidence of infection at this time in my opinion which is good news. 12/04/17-He is seen in follow-up  evaluation for right lateral lower extremity ulcer. He has been applying topical steroid cream. Today's measurement show slight increase in size. Over the next 2 weeks we will transition to every other day Santyl and steroid cream. He has been encouraged to monitor for changes and notify clinic with any concerns 12/15/17 on evaluation today patient's left lateral motion the ulcer and fortunately is doing worse again at this point. This just since last week to this week has close to doubled in size according to the patient. I did not seeing last week's I do not have a visual to compare this to in our system was also down so we do not have all the charts and at this point. Nonetheless it does have me somewhat concerned in regard to the fact that again he was worried enough about it he has contact the dermatology that placed them back on the full strength, 50 mg a day of the prednisone that he was taken previous. He continues to alternate using clobetasol along with Santyl at this point. He is obviously somewhat frustrated. 12/22/17 on evaluation today patient appears to be doing a little worse compared to last evaluation. Unfortunately the wound is a little deeper and slightly larger than the last week's evaluation. With that being said he has made some progress in regard to the irritation surrounding at this time unfortunately despite that progress that's been made he still has a significant issue going on here. 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 [name], [name] ([id number]) For that reason I discussed with him today the possibility of perform a wound culture to ensure there's nothing overtly infected. 01/06/18 on evaluation today patient's wound is larger and deeper than previously evaluated. With that being said it did appear that his wound was  infected after my last evaluation with him. Subsequently I did end up prescribing a prescription for Bactrim DS which she has been taking and having no complication with. Fortunately there does not appear to be any evidence of infection at this point in time as far as anything spreading, no want to touch, and overall I feel like  things are showing signs of improvement. 01/13/18 on evaluation today patient appears to be even a little larger and deeper than last time. There still muscle exposed in the base of the wound. Nonetheless he does appear to be less erythematous I do believe inflammation is calming down also believe the infection looks like it's probably resolved at this time based on what I'm seeing. No fevers, chills, nausea, or vomiting noted at this time. 01/30/18 on evaluation today patient actually appears to visually look better for the most part. Unfortunately those visually this looks better he does seem to potentially have what may be an abscess in the muscle that has been noted in the central portion of the wound. This is the first time that I have noted what appears to be fluctuance in the central portion of the muscle. With that being said I'm somewhat more concerned about the fact that this might indicate an abscess formation at this location. I do believe that an ultrasound would be appropriate. This is likely something we need to try to do as soon as possible. He has been switch to mupirocin ointment and he is no longer using the steroid ointment as prescribed by dermatology he sees them again next week he's been decreased from 60 to 40 mg of prednisone. 03/09/18 on evaluation today patient actually appears to be doing a little better compared to last time I saw him. There's not as much erythema surrounding the wound itself. He I did review his most recent infectious disease note which was dated 02/24/18. He saw Dr. Michel Bickers in [place]. 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 Patient is now off of antibiotics and they are continually observing this. There seems to be no change in the past two weeks in my pinion based on what the patient says and what I see today compared to what Dr. Megan Salon likely saw two weeks ago. No fevers, chills, nausea, or vomiting noted at this time. 03/23/18 on evaluation today patient's wound actually appears to be showing signs of improvement which is good news. He is currently still on the Dapsone. He is also working on tapering the prednisone to get off of this and Dr. Lottie Rater is working with him in this regard. Nonetheless overall I feel like the wound is doing well it does appear based on the infectious disease note that I reviewed from Dr. 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 I did also review the results of his ultrasound of left lower extremity which revealed there was a dentist tissue in the base of the wound without an abscess noted. 04/06/18 on evaluation today the patient's left lateral lower extremity ulcer actually appears to be doing fairly well which is excellent news. There does not appear to be any evidence of infection at this time which is also great news. Overall he still does have a significantly large ulceration although little by little he seems to be making progress. He is down to 10 mg a day of the prednisone. 04/20/18 on evaluation today patient actually appears to be doing excellent at this time in regard to his left lower extremity ulcer. He's making signs of good progress unfortunately this is taking much longer than we would really like to see but nonetheless he is  making progress. Fortunately there does not appear to be any evidence of infection at this time. No fevers, chills, nausea, or vomiting noted at  this time. The patient has not been using the Santyl due to the cost he hadn't got in this field yet. He's mainly been using the antibiotic ointment topically. Subsequently he also tells me that he really has not been scrubbing in the shower I think this would be helpful again as I told him it doesn't have to be anything too aggressive to even make it believe just enough to keep it free of some of the loose slough and biofilm on the wound surface. 05/11/18 on evaluation today patient's wound appears to be making slow but sure progress in regard to the left lateral lower extremity ulcer. He is been tolerating the dressing changes without complication. Fortunately there does not appear to be any evidence of infection at this time. He is still just using triple antibiotic ointment along with clobetasol occasionally over the area. He never got the Santyl and really does not seem to intend to in my pinion. 06/01/18 on evaluation today patient appears to be doing a little better in regard to his left lateral lower extremity ulcer. He states that overall he does not feel like he is doing as well with the Dapsone as he did with the prednisone. Nonetheless he sees his dermatologist later today and is gonna talk to them about the possibility of going back on the prednisone. Overall again I believe that the wound would be better if you would utilize Santyl but he really does not seem to be interested in going back to the Needles at this point. He has been using triple antibiotic ointment. 06/15/18 on evaluation today patient's wound actually appears to be doing about the same at this point. Fortunately there is no signs of infection at this time. He has made slight improvements although he continues to not really want to clean the wound bed at this point. He states that he just doesn't mess with it he doesn't want to cause any problems with everything else he has going on. He has been on medication, antibiotics  as prescribed by his dermatologist, for a staff infection of his lower extremities which is really drying out now and looking much better he tells me. Fortunately there is no sign of overall infection. 06/29/18 on evaluation today patient appears to be doing well in regard to his left lateral lower surety ulcer all things considering. Fortunately his staff infection seems to be greatly improved compared to previous. He has no signs of infection and this is drying up quite nicely. He is still the doxycycline for this is no longer on cental, Dapsone, or any of the other medications. His dermatologist has recommended possibility of an infusion but right now he does not want to proceed with that. 07/13/18 on evaluation today patient appears to be doing about the same in regard to his left lateral lower surety ulcer. Fortunately there's no signs of infection at this time which is great news. Unfortunately he still builds up a significant amount of Slough/biofilm of the surface of the wound he still is not really cleaning this as he should be appropriately. Again I'm able to easily with saline and gauze remove the majority of this on the surface which if you would do this at home would likely be a dramatic improvement for him as far as getting the area to improve. Nonetheless overall I still feel like he  is making progress is just very slow. I think Santyl will be of benefit for him as well. Still he has not gotten this as of this point. 07/27/18 on evaluation today patient actually appears to be doing little worse in regards of the erythema around the periwound region of the wound he also tells me that he's been having more drainage currently compared to what he was experiencing last time I saw him. He states not quite as bad as what he had because this was infected previously but nonetheless is still appears to be doing poorly. Fortunately there is no evidence of systemic infection at this point. The patient  tells me that he is not going to be able to afford the Santyl. He is still waiting to hear about the infusion therapy with his dermatologist. Apparently she wants an updated colonoscopy first. 08/10/18 on evaluation today patient appears to be doing better in regard to his left lateral lower extremity ulcer. Fortunately he is showing signs of improvement in this regard he's actually been approved for Remicade infusion's as well although this has not been scheduled as of yet. Fortunately there's no signs of active infection at this time in regard to the wound although he is having some issues with infection of the right lower extremity is been seen as dermatologist for this. Fortunately they are definitely still working with him trying to keep things under control. [name], [name] ([id number]) 09/07/18 on evaluation today patient is actually doing rather well in regard to his left lateral lower extremity ulcer. He notes these actually having some hair grow back on his extremity which is something he has not seen in years. He also tells me that the pain is really not giving them any trouble at this time which is also good news overall she is very pleased with the progress he's using a combination of the mupirocin along with the probate is all mixed. 09/21/18 on evaluation today patient actually appears to be doing fairly well all things considered in regard to his looks from the ulcer. He's been tolerating the dressing changes without complication. Fortunately there's no signs of active infection at this time which is good news he is still on all antibiotics or prevention of the staff infection. He has been on prednisone for time although he states it is gonna contact his dermatologist and see if she put them on a short course due to some irritation that he has going on currently. Fortunately there's no evidence of any overall worsening this is going very slow I think cental would be something that would be  helpful for him although he states that $50 for tube is quite expensive. He therefore is not willing to get that at this point. 10/06/18 on evaluation today patient actually appears to be doing decently well in regard to his left lateral leg ulcer. He's been tolerating the dressing changes without complication. Fortunately there's no signs of active infection at this time. Overall I'm actually rather pleased with the progress he's making although it's slow he doesn't show any signs of infection and he does seem to be making some improvement. I do believe that he may need a switch up and dressings to try to help this to heal more appropriately and quickly. 10/19/18 on evaluation today patient actually appears to be doing better in regard to his left lateral lower extremity ulcer. This is shown signs of having much less Slough buildup at this point due to the fact he has been using  the Santyl. Obviously this is very good news. The overall size of the wound is not dramatically smaller but again the appearance is. 11/02/18 on evaluation today patient actually appears to be doing quite well in regard to his lower [place] ulcer. A lot of the skin around the ulcer is actually somewhat irritating at this point this seems to be more due to the dressing causing irritation from the adhesive that anything else. Fortunately there is no signs of active infection at this time. 11/24/18 on evaluation today patient appears to be doing a little worse in regard to his overall appearance of his lower extremity ulcer. There's more erythema and warmth around the wound unfortunately. He is currently on doxycycline which he has been on for some time. With that being said I'm not sure that seems to be helping with what appears to possibly be an acute cellulitis with regard to his left lower extremity ulcer. No fevers, chills, nausea, or vomiting noted at this time. 12/08/18 on evaluation today patient's wounds actually appears to  be doing significantly better compared to his last evaluation. He has been using Santyl along with alternating tripling about appointment as well as the steroid cream seems to be doing quite well and the wound is showing signs of improvement which is excellent news. Fortunately there's no evidence of infection and in fact his culture came back negative with only normal skin flora noted. 12/21/2018 upon evaluation today patient actually appears to be doing excellent with regard to his ulcer. This is actually the best that I have seen it since have been helping to take care of him. It is both smaller as well as less slough noted on the surface of the wound and seems to be showing signs of good improvement with new skin growing from the edges. He has been using just the triamcinolone he does wonder if he can get a refill of that ointment today. 01/04/2019 upon evaluation today patient actually appears to be doing well with regard to his left lateral lower extremity ulcer. With that being said it does not appear to be that he is doing quite as well as last time as far as progression is concerned. There does not appear to be any signs of infection or significant irritation which is good news. With that being said I do believe that he may benefit from switching to a collagen based dressing based on how clean The wound appears. 01/18/2019 on evaluation today patient actually appears to be doing well with regard to his wound on the left lower extremity. He is not made a lot of progress compared to where we were previous but nonetheless does seem to be doing okay at this time which is good news. There is no signs of active infection which is also good news. My only concern currently is I do wish we can get him into utilizing the collagen dressing his insurance would not pay for the supplies that we ordered although it appears that he may be able to order this through his supply company that he typically utilizes.  This is [employer]. Nonetheless he did try to order it during the office visit today and it appears this did go through. We will see if he can get that it is a different brand but nonetheless he has collagen and I do think will be beneficial. 02/01/2019 on evaluation today patient actually appears to be doing a little worse today in regard to the overall size of his wounds. Fortunately there  is no signs of active infection at this time. That is visually. Nonetheless when this is happened before it was due to infection. For that reason were somewhat concerned about that this time as well. 02/08/2019 on evaluation today patient unfortunately appears to be doing slightly worse with regard to his wound upon evaluation today. Is measuring a little deeper and a little larger unfortunately. I am not really sure exactly what is causing this to enlarge he actually did see his dermatologist she is going to see about initiating Humira for him. Subsequently she also did do steroid injections into the wound itself in the periphery. Nonetheless still nonetheless he seems to be getting a little bit larger he is gone back to just using the steroid cream topically which I think is appropriate. I would say hold off on the collagen for the time being is definitely a good thing to do. Based on the culture results which we finally did get the final result back regarding it shows staph as the bacteria noted again that can be a normal skin bacteria based on the fact however he is having increased drainage and worsening of the wound measurement wise I would go ahead and place him on an antibiotic today I do believe for this. 02/15/2019 on evaluation today patient actually appears to be doing somewhat better in regard to his ulcer. There is no signs of worsening at this time I did review his culture results which showed evidence of Staphylococcus aureus but not MRSA. Again this could just be more related to the normal skin  bacteria although he states the drainage has slowed down quite a bit he may have had a mild infection not just colonization. And was much smaller and then since around10/04/2019 on evaluation today patient appears to be doing unfortunately worse as far as the size of the wound. I really feel like that this is steadily getting larger again it had been doing excellent right at the beginning of September we have seen a steady increase in the area of the wound it is almost 2-1/2 times the size it was on September 1. Obviously this is a bad trend this is not wanting to see. For that reason we went back to using just the topical triamcinolone cream which does seem to help with inflammation. I checked him for bacteria by way of culture and nothing showed positive there. I am considering giving him a short course of a tapering steroid Dosepak today to see if that is can be beneficial for him. The patient is in agreement with giving that a try. 03/08/2019 on evaluation today patient appears to be doing very well in comparison to last evaluation with regard to his lower extremity ulcer. This is showing signs of less inflammation and actually measuring slightly smaller compared to last time every other week over the past month and a half he has been measuring larger larger larger. Nonetheless I do believe that the issue has been inflammation the prednisone does seem to [facility] ([id number]) have been beneficial for him which is good news. No fevers, chills, nausea, vomiting, or diarrhea. 03/22/2019 on evaluation today patient appears to be doing about the same with regard to his leg ulcer. He has been tolerating the dressing changes without complication. With that being said the wound seems to be mostly arrested at its current size but really is not making any progress except for when we prescribed the prednisone. He did show some signs of dropping as far as  the overall size of the wound during that interval  week. Nonetheless this is something he is not on long-term at this point and unfortunately I think he is getting need either this or else the Humira which his dermatologist has discussed try to get approval for. With that being said he will be seeing his dermatologist on the 11th of this month that is November. 04/19/2019 on evaluation today patient appears to be doing really about the same the wound is measuring slightly larger compared to last time I saw him. He has not been into the office since November 2 due to the fact that he unfortunately had Covid as that his entire family. He tells me that it was rough but they did pull-through and he seems to be doing much better. Fortunately there is no signs of active infection at this time. No fevers, chills, nausea, vomiting, or diarrhea. 05/10/2019 on evaluation today patient unfortunately appears to be doing significantly worse as compared to last time I saw him. He does tell me that he has had his first dose of Humira and actually is scheduled to get the next one in the upcoming week. With that being said he tells me also that in the past several days he has been having a lot of issues with green drainage she showed me a picture this is more blue-green in color. He is also been having issues with increased sloughy buildup and the wound does appear to be larger today. Obviously this is not the direction that we want everything to take based on the starting of his Humira. Nonetheless I think this is definitely a result of likely infection and to be honest I think this is probably Pseudomonas causing the infection based on what I am seeing. 05/24/2019 on evaluation today patient unfortunately appears to be doing significantly worse compared to his prior evaluation with me 2 weeks ago. I did review his culture results which showed that he does have Staph aureus as well as Pseudomonas noted on the culture. Nonetheless the Levaquin that I prescribed for him  does not appear to have been appropriate and in fact he tells me he is no longer experiencing the green drainage and discharge that he had at the last visit. Fortunately there is no signs of active infection at this time which is good news although the wound has significantly worsened it in fact is much deeper than it was previous. We have been utilizing up to this point triamcinolone ointment as the prescription topical of choice but at this time I really feel like that the wound is getting need to be packed in order to appropriately manage this due to the deeper nature of the wound. Therefore something along the lines of an alginate dressing may be more appropriate. 05/31/2019 upon inspection today patient's wound actually showed signs of doing poorly at this point. Unfortunately he just does not seem to be making any good progress despite what we have tried. He actually did go ahead and pick up the Cipro and start taking that as he was noticing more green drainage he had previously completed the Levaquin that I prescribed for him as well. Nonetheless he missed his appointment for the seventh last week on Wednesday with the wound care center and [facility] where his dermatologist referred him. Obviously I do think a second opinion would be helpful at this point especially in light of the fact that the patient seems to be doing so poorly despite the fact  that we have tried everything that I really know how at this point. The only thing that ever seems to have helped him in the past is when he was on high doses of continual steroids that did seem to make a difference for him. 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 06/07/2019 upon evaluation today patient unfortunately appears to be doing worse yet again with regard to his wound. In fact I  am starting to question whether or not he may have a fluid pocket in the muscle at this point based on the bulging and the soft appearance to the central portion of the muscle area. There is not anything draining from the muscle itself at this time which is good news but nonetheless the wound is expanding. I am not really seeing any results of the Humira as far as overall wound progression based on what I am seeing at this point. The patient has been referred for second opinion with regard to his wound to the [facility] wound care center by his dermatologist which I definitely am not in opposition to. Unfortunately we tried multiple dressings in the past including collagen, alginate, and at one point even Hydrofera Blue. With that being said he is never really used it for any significant amount of time due to the fact that he often complains of pain associated with these dressings and then will go back to either using the Santyl which she has done intermittently or more frequently the triamcinolone. He is also using his own compression stockings. We have wrapped him in the past but again that was something else that he really was not a big fan of. 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 He seemed to do much better when he was on the steroids although honestly I understand that the steroids are not necessarily the best medication to be on long-term obviously 06/14/2019 on evaluation today patient appears to be doing actually a little bit better with regard to the overall appearance with his leg. Unfortunately he does continue to have issues with what appears to be some fluid underneath  the muscle although he did see the wound specialty center at [facility] last week their main goals were to see about infusion therapy in place of the Humira as they feel like that is not quite strong enough. They also recommended that we continue with the treatment otherwise as we are they felt like that was appropriate and they are okay with him continuing to follow-up here with [place] in that regard. With that being said they are also sending him to the vein specialist there to see about vein stripping and if that would be of benefit for him. Subsequently they also did not really address whether or not an ultrasound of the muscle area to see if there is anything that needs to be addressed here would be appropriate or not. For that reason I discussed this with him last week I think we  may proceed down that road at this point. 06/21/2019 upon evaluation today patient's wound actually appears to be doing slightly better compared to previous evaluations. I do believe that he has made a difference with regard to the progression here with the use of oral steroids. Again in the past has been the only thing that is really calm things down. He does tell me that from [facility] is gotten a good news from there that there are no further vein stripping that is necessary at this point. I do not have that available for review today although the patient did relay this to me. He also did obtain and have the ultrasound of the wound completed which I did sign off on today. It does appear that there is no fluid collection under the muscle this is likely then just edematous tissue in general. That is also good news. Overall I still believe the inflammation is the main issue here. He did inquire about the possibility of a wound VAC again with the muscle protruding like it is I am not really sure whether the wound VAC is necessarily ideal or not. That is something we will have to consider although I do believe he may need compression  wrapping to try to help with edema control which could potentially be of benefit. 06/28/2019 on evaluation today patient appears to be doing slightly better measurement wise although this is not terribly smaller he least seems to be trending towards that direction. With that being said he still seems to have purulent drainage noted in the wound bed at this time. He has been on Levaquin followed by Cipro over the past month. Unfortunately he still seems to have some issues with active infection at this time. I did perform a culture last week in order to evaluate and see if indeed there was still anything going on. Subsequently the culture did come back showing Pseudomonas which is consistent with the drainage has been having which is blue-green in color. He also has had an odor that again was somewhat consistent with Pseudomonas as well. Long story short it appears that the culture showed an intermediate finding with regard to how well the Cipro will work for the Pseudomonas infection. Subsequently being that he does not seem to be clearing up and at best what we are doing is just keeping this at [facility] I think he may need to see infectious disease to discuss IV antibiotic options. [name], [name] ([id number]) 07/05/2019 upon evaluation today patient appears to be doing okay in regard to his leg ulcer. He has been tolerating the dressing changes at this point without complication. Fortunately there is no signs of active infection at this time which is good news. No fevers, chills, nausea, vomiting, or diarrhea. With that being said he does have an appointment with infectious disease tomorrow and his primary care on Wednesday. Again the reason for the infectious disease referral was due to the fact that he did not seem to be fully resolving with the use of oral antibiotics and therefore we were thinking that IV antibiotic therapy may be necessary secondary to the fact that there was an intermediate finding for  how effective the Cipro may be. Nonetheless again he has been having a lot of purulent and even green drainage. Fortunately right now that seems to have calmed down over the past week with the reinitiation of the oral antibiotic. Nonetheless we will see what Dr. Megan Salon has to say. 07/12/2019 upon evaluation today patient appears to be  doing about the same at this point in regard to his left lower extremity ulcer. Fortunately there is no signs of active infection at this time which is good news I do believe the Levaquin has been beneficial I did review Dr. Hale Bogus note and to be honest I agree that the patient's leg does appear to be doing better currently. What we found in the past as he does not seem to really completely resolve he will stop the antibiotic and then subsequently things will revert back to having issues with blue-green drainage, increased pain, and overall worsening in general. Obviously that is the reason I sent him back to infectious disease. 07/19/2019 upon evaluation today patient appears to be doing roughly the same in size there is really no dramatic improvement. He has started back on the Levaquin at this point and though he seems to be doing okay he did still have a lot of blue/green drainage noted on evaluation today unfortunately. I think that this is still indicative more likely of a Pseudomonas infection as previously noted and again he does see Dr. Megan Salon in just a couple of days. I do not know that were really able to effectively clear this with just oral antibiotics alone based on what I am seeing currently. Nonetheless we are still continue to try to manage as best we can with regard to the patient and his wound. I do think the wrap was helpful in decreasing the edema which is excellent news. No fevers, chills, nausea, vomiting, or diarrhea. 07/26/2019 upon evaluation today patient appears to be doing slightly better with regard to the overall appearance of the muscle  there is no dark discoloration centrally. Fortunately there is no signs of active infection at this time. No fevers, chills, nausea, vomiting, or diarrhea. Patient's wound bed currently the patient did have an appointment with Dr. Megan Salon at infectious disease last week. With that being said Dr. Megan Salon the patient states was still somewhat hesitant about put him on any IV antibiotics he wanted [place] to repeat cultures today and then see where things go going forward. He does look like Dr. Megan Salon because of some improvement the patient did have with the Levaquin wanted [place] to see about repeating cultures. If it indeed grows the Pseudomonas again then he recommended a possibility of considering a PICC line placement and IV antibiotic therapy. He plans to see the patient back in 1 to 2 weeks. 08/02/2019 upon evaluation today patient appears to be doing poorly with regard to his left lower extremity. We did get the results of his culture back it shows that he is still showing evidence of Pseudomonas which is consistent with the purulent/blue-green drainage that he has currently. Subsequently the culture also shows that he now is showing resistance to the oral fluoroquinolones which is unfortunate as that was really the only thing to treat the infection prior. I do believe that he is looking like this is going require IV antibiotic therapy to get this under control. Fortunately there is no signs of systemic infection at this time which is good news. The patient does see Dr. Megan Salon tomorrow. 08/09/2019 upon evaluation today patient appears to be doing better with regard to his left lower extremity ulcer in regard to the overall appearance. He is currently on IV antibiotic therapy. As ordered by Dr. Megan Salon. Currently the patient is on ceftazidime which she is going to take for the next 2 weeks and then follow-up for 4 to 5-week appointment with Dr. Megan Salon.  The patient started this this past Friday  symptoms have not for a total of 3 days currently in full. 08/16/2019 upon evaluation today patient's wound actually does show muscle in the base of the wound but in general does appear to be much better as far as the overall evidence of infection is concerned. In fact I feel like this is for the most part cleared up he still on the IV antibiotics he has not completed the full course yet but I think he is doing much better which is excellent news. 08/23/2019 upon evaluation today patient appears to be doing about the same with regard to his wound at this point. He tells me that he still has pain unfortunately. Fortunately there is no evidence of systemic infection at this time which is great news. There is significant muscle protrusion. 09/13/19 upon evaluation today patient appears to be doing about the same in regard to his leg unfortunately. He still has a lot of drainage coming from the ulceration there is still muscle exposed. With that being said the patient's last wound culture still showed an intermediate finding with regard to the Pseudomonas he still having the bluish/green drainage as well. Overall I do not know that the wound has completely cleared of infection at this point. Fortunately there is no signs of active infection systemically at this point which is good news. 09/20/2019 upon evaluation today patient's wound actually appears to be doing about the same based on what I am seeing currently. I do not see any signs of systemic infection he still does have evidence of some local infection and drainage. He did see Dr. Megan Salon last week and Dr. Megan Salon states that he probably does need a different IV antibiotic although he does not want to put him on this until the patient begins the Remicade infusion which is actually scheduled for about 10 days out from today on 13 May. Following that time Dr. Megan Salon is good to see him back and then will evaluate the feasibility of starting him on the  IV antibiotic therapy once again at that point. I do not disagree with this plan I do believe as Dr. Megan Salon stated in his note that I reviewed today that the patient's issue is multifactorial with the pyoderma being 1 aspect of this that were hoping the Remicade will be helpful for her. In the meantime I think the gentamicin is, helping to keep things under decent okay control in regard to the ulcer. 09/27/2019 upon evaluation today patient appears to be doing about the same with regard to his wound still there is a lot of muscle exposure though he does have some hyper granulation tissue noted around the edge and actually some granulation tissue starting to form over the muscle which is actually good news. Fortunately there is no evidence of active infection which is also good news. His pain is less at this point. 5/21; this is a patient I have not seen in a long time. 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 He has a large wound on the left lateral leg with protruding muscle. He comes in the clinic today showing the same area on his left medial ankle. He says there is been a spot there for some time although we have not previously defined this. Today he has a clearly defined area with slight amount of skin breakdown surrounded by raised areas with a purplish hue in color. This is not painful he says it is irritated.  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 He still has muscle protruding from the lateral portion of his left leg but fortunately the new area that they were concerned about at his last visit does not appear to have opened at this point. He is currently on Remicade infusions and seems to be doing better in my opinion in fact the wound itself seems to be overall much better. The purplish discoloration that he did have seems to have resolved  and I think that is a good sign that hopefully the Remicade is doing its job. He does have some biofilm noted over the surface of the wound. 11/01/2019 on evaluation today patient's wound actually appears to be doing excellent at this time. Fortunately there is no evidence of active infection and overall I feel like he is making great progress. The Remicade seems to be due excellent job in my opinion. [name], [name] ([id number]) 11/08/19 evaluation today vision actually appears to be doing quite well with regard to his weight ulcer. He's been tolerating dressing changes without complication. Fortunately there is no evidence of infection. No fevers, chills, nausea, or vomiting noted at this time. Overall states that is having more itching than pain which is actually a good sign in my opinion. 12/13/2019 upon evaluation today patient appears to be doing well today with regard to his wound. He has been tolerating the dressing changes without complication. Fortunately there is no sign of active infection at this time. No fevers, chills, nausea, vomiting, or diarrhea. Overall I feel like the infusion therapy has been very beneficial for him. 01/06/2020 on evaluation today patient appears to be doing well with regard to his wound. This is measuring smaller and actually looks to be doing better. Fortunately there is no signs of active infection at this point. No fevers, chills, nausea, vomiting, or diarrhea. With that being said he does still have the blue-green drainage but this does not seem to be causing any significant issues currently. He has been using the gentamicin that does seem to be keeping things under decent control at this point. He goes later this morning for his next infusion therapy for the pyoderma which seems to also be very beneficial. 02/07/2020 on evaluation today patient appears to be doing about the same in regard to his wounds currently. Fortunately there is no signs of active infection  systemically he does still have evidence of local infection still using gentamicin. He also is showing some signs of improvement albeit slowly I do feel like we are making some progress here. 02/21/2020 upon evaluation today patient appears to be making some signs of improvement the wound is measuring a little bit smaller which is great news and overall I am very pleased with where he stands currently. He is going to be having infusion therapy treatment on the 15th of this month. Fortunately there is no signs of active infection at this time. 03/13/2020 I do believe patient's wound is actually showing some signs of improvement here which is great news. He has continue with the infusion therapy through rheumatology/dermatology at [facility]. That does seem to be beneficial. I still think he gets as much benefit from this as he did from the prednisone initially but nonetheless obviously this is less harsh on his body that the prednisone as far as they are concerned. 03/31/2020 on evaluation today patient's wound actually showing signs of some pretty good improvement in regard  to the overall appearance of the wound bed. There is still muscle exposed though he does have some epithelial growth around the edges of the wound. Fortunately there is no signs of active infection at this time. No fevers, chills, nausea, vomiting, or diarrhea. 04/24/2020 upon evaluation today patient appears to be doing about the same in regard to his leg ulcer. He has been tolerating the dressing changes without complication. Fortunately there is no signs of active infection at this time. No fevers, chills, nausea, vomiting, or diarrhea. With that being said he still has a lot of irritation from the bandaging around the edges of the wound. We did discuss today the possibility of a referral to plastic surgery. 05/22/2020 on evaluation today patient appears to be doing well with regard to his wounds all things considered. He has not been able  to get the Chantix apparently there is a recall nurse that I was unaware of put out by [employer] involuntarily. Nonetheless for now I am and I have to do some research into what may be the best option for him to help with quitting in regard to smoking and we discussed that today. 06/26/2020 upon evaluation today patient appears to be doing well with regard to his wound from the standpoint of infection I do not see any signs of infection at this point. With that being said unfortunately he is still continuing to have issues with muscle exposure and again he is not having a whole lot of new skin growth unfortunately. There does not appear to be any signs of active infection at this time. No fevers, chills, nausea, vomiting, or diarrhea. 07/10/2020 upon evaluation today patient appears to be doing a little bit more poorly currently compared to where he was previous. I am concerned currently about an active infection that may be getting worse especially in light of the increased size and tenderness of the wound bed. No fevers, chills, nausea, vomiting, or diarrhea. 07/24/2020 upon evaluation today patient appears to be doing poorly in regard to his leg ulcer. He has been tolerating the dressing changes without complication but unfortunately is having a lot of discomfort. 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 Subsequently I think he is going require possibly IV antibiotics to get this under control. I am very concerned about the severity of his infection and the amount of discomfort he is having. 07/31/2020 upon evaluation today patient appears to be doing about the same in regard to his leg wound. He did see Dr. Megan Salon and Dr. Megan Salon is actually going to start him on IV antibiotics. He goes for the PICC line tomorrow. With that being said there do not have that run for 2 weeks and then see how things are doing and depending on how he  is progressing they may extend that a little longer. Nonetheless I am glad this is getting ready to be in place and definitely feel it may help the patient. In the meantime is been using mainly triamcinolone to the wound bed has an anti-inflammatory. 08/07/2020 on evaluation today patient appears to be doing well with regard to his wound compared even last week. In the interim he has gotten the PICC line placed and overall this seems to be doing excellent. There does not appear to be any evidence of infection which is great news systemically although locally of course has had the infection this appears to be improving with the use of the antibiotics. 08/14/2020 upon  evaluation today patient's wound actually showing signs of excellent improvement. Overall the irritation has significantly improved the drainage is back down to more of a normal level and his pain is really pretty much nonexistent compared to what it was. Obviously I think that this is significantly improved secondary to the IV antibiotic therapy which has made all the difference in the world. Again he had a resistant form of Pseudomonas for which oral antibiotics just was not cutting it. Nonetheless I do think that still we need to consider the possibility of a surgical closure for this wound is been open so long and to be honest with muscle exposed I think this can be very hard to get this to close outside of this although definitely were still working to try to do what we can in that regard. 08/21/2020 upon evaluation today patient appears to be doing very well with regard to his wounds on the left lateral lower extremity/calf area. Fortunately there does not appear to be signs of active infection which is great news and overall very pleased with where things stand today. He is actually wrapping up his treatment with IV antibiotics tomorrow. After that we will see where things go from there. 08/28/2020 upon evaluation today patient appears  to be doing decently well with regard to his leg ulcer. There does not appear to be any signs of active infection which is great news and overall very pleased with where things stand today. No fevers, chills, nausea, vomiting, or diarrhea. 09/18/2020 upon evaluation today patient appears to be doing well with regard to his infection which I feel like is better. Unfortunately he is not doing as well with regard to the overall size of the wound which is not nearly as good at this point. I feel like that he may be having an issue here with the pyoderma being somewhat out of control. I think that he may benefit from potentially going back and talking to the dermatologist about [name], [name] ([id number]) what to do from the pyoderma standpoint. I am not certain if the infusions are helping nearly as much is what the prednisone did in the past. 10/02/2020 upon evaluation today patient appears to be doing well with regard to his leg ulcer. He did go to the [facility] nurse. Unfortunately they feel like there is a 10% chance that most that he would be able to heal and that the skin graft would take. Obviously this has led him to not be able to go down that path as far as treatment is concerned. Nonetheless he does seem to be doing a little bit better with the prednisone that I gave him last time. I think that he may need to discuss with dermatology the possibility of long-term prednisone as that seems to be what is most helpful for him to be perfectly honest. I am not sure the Remicade is really doing the job. 10/17/2020 upon evaluation today patient appears to be doing a little better in regard to his wound. In fact the case has been since we did the prednisone on May 2 for him that we have noticed a little bit of improvement each time we have seen a size wise as well as appearance wise as well as pain wise. I think the prednisone has had a greater effect then the infusion therapy has to be perfectly honest. With  that being said the patient also feels significantly better compared to what he was previous. All of this is good news but  nonetheless I am still concerned about the fact that again we are really not set up to long-term manage him as far as prednisone is concerned. Obviously there are things that you need to be watched I completely understand the risk of prednisone usage as well. 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 With all that being said I do believe that we can give him another round of the prednisone which she is requesting today because of the improvement that he seen since we did that first round. 10/30/2020 upon evaluation today patient's wound actually is showing signs of doing quite well. There does not appear to be any evidence of infection which is great news and overall very pleased with where things stand today. No fevers, chills, nausea, vomiting, or diarrhea. He tells me that the prednisone still has seem to have helped he wonders if we can extend that for just a little bit longer. He did not have the appointment with a dermatologist although he did have an infusion appointment last Friday. That was at [facility]. With that being said he tells me he could not do both that as well as the appointment with the physician on the same day therefore that is can have to be rescheduled. I really want to see if there is anything they feel like that could be done differently to try to help this out as I am not really certain that the infusions are helping significantly here. 11/13/2020 upon evaluation today patient unfortunately appears to be doing somewhat poorly in regard to his wound I feel like this is actually worsening from the standpoint of the pyoderma spreading. I still feel like that he may need something different as far as trying to manage this going forward. Again we did the prednisone unfortunately his blood sugars are not doing so well  because of this. Nonetheless I believe that the patient likely needs to try topical steroid. We have done triamcinolone for a while I think going with something stronger such as clobetasol could be beneficial again this is not something I do lightly I discussed this with the patient that again this does not normally put underneath an occlusive dressing. Nonetheless I think a thin film as such could help with some of the stronger anti-inflammatory effects. We discussed this today. He would like to try to give this a trial for the next couple weeks. I definitely think that is something that we can do. Evaluate7/03/2021 and today patient's wound bed actually showed signs of doing really about the same. There was a little expansion of the size of the wound and that leading edge that we done looking out although the clobetasol does seem to have slowed this down a bit in my opinion. There is just 1 small area that still seems to be progressing based on what I see. Nonetheless I am concerned about the fact this does not seem to be improving if anything seems to be doing a little bit worse. I do not know that the infusions are really helping him much as next infusion is August 5 his appointment with dermatology is July 25. Either way I really think that we need to have a conversation potentially about this and I am actually going to see if I can talk with Dr. Lillia Carmel in order to see where things stand as well. 12/11/2020 upon evaluation today patient appears to be doing worse in regard to his leg ulcer. Unfortunately  I just do not think this is making the progress that I would like to see at this point. Honestly he does have an appointment with dermatology and this is in 2 days. I am wondering what they may have to offer to help with this. Right now what I am seeing is that he is continuing to show signs of worsening little by little. Obviously that is not great at all. Is the exact opposite of what we are  looking for. 12/18/2020 upon evaluation today patient appears to be doing a little better in regard to his wound. The dermatologist actually did do some steroid injections into the wound which does seem to have been beneficial in my opinion. That was on the 25th already this looks a little better to me than last time I saw him. With that being said we did do a culture and this did show that he has Staph aureus noted in abundance in the wound. With that being said I do think that getting him on an oral antibiotic would be appropriate as well. Also think we can compression wrap and this will make a difference as well. 12/28/2020 upon evaluation today patient's wound is actually showing signs of doing much better. I do believe the compression wrap is helping he has a lot of drainage but to be honest I think that the compression is helping to some degree in this regard as well as not draining through which is also good news. No fevers, chills, nausea, vomiting, or diarrhea. 01/04/2021 upon evaluation today patient appears to be doing well with regard to his wound. Overall things seem to be doing quite well. He did have a little bit of reaction to the CarboFlex Sorbact he will be using that any longer. With that being said he is controlled as far as the drainage is concerned overall and seems to be doing quite well. I do not see any signs of active infection at this time which is great news. No fevers, chills, nausea, vomiting, or diarrhea. 01/11/2021 upon evaluation today patient appears to be doing well with regard to his wounds. He has been tolerating the dressing changes without complication. Fortunately there does not appear to be any signs of active infection at this time which is great news. Overall I am extremely pleased with where we stand currently. No fevers, chills, nausea, vomiting, or diarrhea. Where using clobetasol in the wound bed he has a lot of new skin growth which is awesome as  well. 01/18/2021 upon evaluation today patient appears to be doing very well in regard to his leg ulcer. He has been tolerating the dressing changes without complication. Fortunately there does not appear to be any signs of active infection which is great news. In general I think that he is making excellent progress 01/25/2021 upon evaluation today patient appears to be doing well with regard to his wound on the leg. I am actually extremely pleased with where things stand today. There does not appear to be any signs of active infection which is great news and overall I think that we are definitely headed in the appropriate direction based on what I am seeing currently. There does not appear to be any signs of active infection also excellent news. 02/06/2021 upon evaluation today patient appears to be doing well with regard to his wound. Overall visually this is showing signs of significant improvement which is great news. I do not see any signs of active infection systemically which is great even locally  I do not think that we are seeing any major complications here. We did do fluorescence imaging with the [employer] today. The patient does have some odor and drainage noted and again this is something that I think would benefit him to probably come more frequently for nurse visits. 02/19/2021 upon evaluation today patient actually appears to be doing quite well in regard to his wound. He has been tolerating the dressing Adler, Durante ([id number]) changes without complication and overall I think that this is making excellent progress. I do not see any evidence of active infection at this point which is great news as well. No fevers, chills, nausea, vomiting, or diarrhea. 10/10; wound is made nice progress healthy granulation with a nice rim of epithelialization which seems to be expanding even from last week he has a deeper area in the inferior part of the more distal part of the wound with not quite as  healthy as surface. This area will need to be followed. Using clobetasol and Hydrofera Blue 03/05/2021 upon evaluation today patient appears to be doing very well in regard to his leg ulcer. He has been tolerating dressing changes without complication. Fortunately there does not appear to be any signs of active infection which is great news and overall I am extremely pleased with where we stand currently. 03/12/2021 upon evaluation today patient appears to be doing well with regard to his wound in fact this is extremely extremely good based on what we are seeing today there does not appear to be any signs of active infection and overall I think that he is doing awesome from the standpoint of healing in general. I am extremely pleased with how things seem to be progressing with regard to this pyoderma. Clobetasol has done wonders for him. I think the compression wrapping has also been of great benefit. 03/19/2021 upon evaluation today patient appears to be doing well with regard to his wound. He is tolerating the dressing changes without complication. In fact I feel like that he is actually making excellent progress at this point based on what I am seeing. No fevers, chills, nausea, vomiting, or diarrhea. 03/26/2021 upon evaluation today patient appears to be doing well with regard to his wound. This again is measuring smaller and looking better. Again the progress is slow but nonetheless continual with what we have been seeing. I do believe that the current plan is doing awesome for him. 04/09/2021 upon evaluation today patient appears to be doing well with regard to his leg ulcer. This is showing signs of excellent improvement the muscle is completely closed over and there does not appear to be any evidence of inflammation at this point his drainage is significantly improved. Overall I think that he would be a good candidate for looking into a skin substitute at this point as well. We will get a look  into some approvals in that regard. Potentially TheraSkin as well as Apligraf could both be considered just depending on insurance coverage. 04/16/2021 upon evaluation today patient appears to be doing well at this point. He has been approved for the Apligraf which we could definitely order although I would like to try to get the TheraSkin approved if at all possible. I did fax notes into them today and I Georgina Peer try to give a call as well. Overall the wound appears to be doing decently well today. 04/20/2021 upon evaluation today patient actually appears to be doing quite well in regard to his wound with that being said we  are trying to see what we can do about speeding up the healing process. For that reason we did discuss the possibility of a skin substitute. We got the Apligraf approved. For that reason we will get a go ahead and see what we can do with the Apligraf at this point. I am still trying to get the TheraSkin approved but I have not heard anything from the insurance company yet I have called and talked to them earlier in the week and to be honest this was about half an hour that I spent on the phone they told me I would hear something within 10-15 business days. 04/27/2021 upon evaluation today patient appears to be doing excellent in regard to his wound. I do believe the Apligraf has been beneficial. With that being said he has had a little bit of increased pain has been a little bit concerned. I do want to go ahead and apply his steroid cream today the clobetasol and then put the Apligraf over I just do not think I want to risk not putting the clobetasol on based on what is been going on here currently. Patient voiced understanding. 05/04/2021 upon evaluation today patient is making excellent improvement. Overall there is definitely decrease in the size of the wound today and I am very pleased in that regard. I do not see any signs of active infection locally nor systemically at this  point. No fevers, chills, nausea, vomiting, or diarrhea. 05/11/2021 upon evaluation today patient appears to be doing well with regard to his wound. He has been tolerating Apligraf's without complication and is making excellent progress this is application #4 today. 12/30; patient here for Apligraf application. Appears to be doing well small wound there has been a lot of healing 05/28/2021 upon evaluation today patient appears to be doing well with regard to his wound. Has been tolerating the dressing changes without complication. Fortunately I do not see any signs of active infection locally nor systemically at this point which is great news. He is done with the Apligraf and the first round and overall this has filled in quite nicely I am not even certain he needs any additional at the moment. I would actually try to go back to clobetasol with [facility] which previously was doing well for him. 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 He is using clobetasol and Hydrofera Blue. Surface area of the wound is smaller and the surface looks very healthy. 06/11/2021 upon evaluation today patient actually appears to be showing signs of good improvement which is great news. Fortunately I do not see any signs of active infection at this time which is also excellent news. I do believe that the patient is doing well with the clobetasol and Hydrofera Blue. He does have some irritation and itching around the rest of the leg will use some triamcinolone over this area. 06/18/2021 upon evaluation today patient appears to be doing well with regard to his wound. He is showing signs of excellent improvement and overall I am extremely pleased with where we stand today. We are moving in the right direction. 06/25/2021 upon evaluation today patient appears to be doing well with regard to his wound. In fact this is doing also not showing signs of excellent improvement  overall very pleased with where we stand today. I do not see any evidence of active infection locally or systemically at this time which is also great news. 07/02/2021 upon evaluation patient's wound  bed actually showed signs of good granulation and epithelization at this point. And this is measuring significantly smaller and overall I am extremely pleased with where we stand today. No fevers, chills, nausea, vomiting, or diarrhea. 07/09/2021 upon evaluation patient's wound bed actually showed signs of good granulation and epithelization at this point. Fortunately there does not appear to be any evidence of active infection locally or systemically which is great news and overall I am extremely pleased with where we stand at this point. 07/16/2021 upon evaluation today patient appears to be doing well with regard to his wound all things considered although it is maybe a little bit larger than what its been. This does have me a little concerned. Actually question about whether anything is changed and initially he told me know. In fact it was not until the end of the visit that he actually did mention that he had missed his infusion in January. This very well may be [name], [name] ([id number]) what is because the difference is also seeing currently. That definitely has seem to been helping more recently. 07/23/2021 upon evaluation today patient's wound is yet again larger despite the switch to a collagen which was not beneficial. Subsequently I am going to at this point check on a couple things. First and foremost I do want to go ahead and do a culture. I think that there is a chance he could have a low-lying infection that may be part of the issue here. Subsequently I am also probably can go ahead and place him on Bactrim DS which I think is a good option and has done well with them in the past. Again if this is an infection that should hopefully help to turn things around. With all that being said I do  believe that the patient should hopefully be able to turn this around with reinitiation of the infusion therapy which will be going on this week and subsequently getting on the antibiotic. Electronic Signature(s) Signed: 07/23/2021 8:41:19 AM By: Worthy Keeler PA-C Entered By: Worthy Keeler on 07/23/2021 08:41:18 JODI, KAPPES ([id number]) -------------------------------------------------------------------------------- Physical Exam Details Patient Name: [name] Date of Service: 07/23/2021 8:15 AM Medical Record Number: [id number] Patient Account Number: [id number] Date of Birth/Sex: 1980-06-28 (40 y.o. M) Treating RN: Levora Dredge Primary Care Provider: [name] Other Clinician: Referring Provider: [name] Treating Provider/Extender: [name] Cliche in Treatment: 50 Constitutional Well-nourished and well-hydrated in no acute distress. Respiratory normal breathing without difficulty. Psychiatric this patient is able to make decisions and demonstrates good insight into disease process. Alert and Oriented x 3. pleasant and cooperative. Notes Upon inspection patient's wound bed actually showed signs of good granulation epithelization at this point. Fortunately I do not see any evidence of active infection locally or systemically which is great news and overall this is good nonetheless I am concerned that there could potentially be some infection when it comes to the legs simply due to the fact that this is significantly worse compared to what it was previous. Electronic Signature(s) Signed: 07/23/2021 8:42:50 AM By: Worthy Keeler PA-C Entered By: Worthy Keeler on 07/23/2021 08:42:50 [name], ASTARITA ([id number]) -------------------------------------------------------------------------------- Physician Orders Details Patient Name: [name] Date of Service: 07/23/2021 8:15 AM Medical Record Number: [id number] Patient Account Number: [id number] Date of  Birth/Sex: 12-16-1980 (40 y.o. M) Treating RN: Levora Dredge Primary Care Provider: [name] Other Clinician: Referring Provider: [name] Treating Provider/Extender: [name] Cliche in Treatment: [phone] Verbal / Phone Orders: No Diagnosis Coding ICD-10 Coding  Code Description I87.2 Venous insufficiency (chronic) (peripheral) L97.222 Non-pressure chronic ulcer of left calf with fat layer exposed E11.622 Type 2 diabetes mellitus with other skin ulcer L88 Pyoderma gangrenosum F17.208 Nicotine dependence, unspecified, with other nicotine-induced disorders Follow-up Appointments o Return Appointment in 1 week. o Nurse Visit as needed - twice a week Bathing/ Shower/ Hygiene o Clean wound with Normal Saline or wound cleanser. o May shower with wound dressing protected with water repellent cover or cast protector. Edema Control - Lymphedema / Segmental Compressive Device / Other o Optional: One layer of unna paste to top of compression wrap (to act as an anchor). o Elevate, Exercise Daily and Avoid Standing for Long Periods of Time. o Elevate legs to the level of the heart and pump ankles as often as possible o Elevate leg(s) parallel to the floor when sitting. Wound Treatment Wound #1 - Lower Leg Wound Laterality: Left, Lateral Cleanser: Wound Cleanser 3 x Per Week/30 Days Discharge Instructions: Wash your hands with soap and water. Remove old dressing, discard into plastic bag and place into trash. Cleanse the wound with Wound Cleanser prior to applying a clean dressing using gauze sponges, not tissues or cotton balls. Do not scrub or use excessive force. Pat dry using gauze sponges, not tissue or cotton balls. Topical: Clobetasol Propionate ointment 0.05%, 60 (g) tube 3 x Per Week/30 Days Discharge Instructions: Pt to bring to appt- Primary Dressing: Hydrofera Blue Ready Transfer Foam, 4x5 (in/in) 3 x Per Week/30 Days Discharge Instructions: Apply  Hydrofera Blue Ready to wound bed as directed Secondary Dressing: (NON-BORDER) Zetuvit Plus Silicone NON-BORDER 5x5 (in/in) 3 x Per Week/30 Days Compression Wrap: Medichoice 4 layer Compression System, 35-40 mmHG (Generic) 3 x Per Week/30 Days Discharge Instructions: Apply multi-layer wrap as directed. Laboratory o Bacteria identified in Wound by Culture (MICRO) - left lower leg wound - (ICD10 L97.222 - Non-pressure chronic ulcer of left calf with fat layer exposed) oooo LOINC Code: 6606-3 oooo Convenience Name: Wound culture routine Patient Medications Allergies: milk, Biaxin, seasonal Digirolamo, Dezmin ([id number]) Notifications Medication Indication Start End Bactrim DS 07/23/2021 DOSE 1 - oral 800 mg-160 mg tablet - 1 tablet oral taken 2 times per day for 14 days Electronic Signature(s) Signed: 07/23/2021 8:44:31 AM By: Worthy Keeler PA-C Entered By: Worthy Keeler on 07/23/2021 08:44:30 [name], [name] ([id number]) -------------------------------------------------------------------------------- Problem List Details Patient Name: [name] Date of Service: 07/23/2021 8:15 AM Medical Record Number: [id number] Patient Account Number: [id number] Date of Birth/Sex: 06-17-1980 (40 y.o. M) Treating RN: Levora Dredge Primary Care Provider: [name] Other Clinician: Referring Provider: [name] Treating Provider/Extender: [name] Cliche in Treatment: [phone] Active Problems ICD-10 Encounter Code Description Active Date MDM Diagnosis I87.2 Venous insufficiency (chronic) (peripheral) 12/04/2016 No Yes L97.222 Non-pressure chronic ulcer of left calf with fat layer exposed 12/04/2016 No Yes E11.622 Type 2 diabetes mellitus with other skin ulcer 04/09/2021 No Yes L88 Pyoderma gangrenosum 03/26/2017 No Yes F17.208 Nicotine dependence, unspecified, with other nicotine-induced disorders 04/24/2020 No Yes Inactive Problems ICD-10 Code Description Active Date Inactive Date L97.213  Non-pressure chronic ulcer of right calf with necrosis of muscle 04/02/2017 04/02/2017 Resolved Problems ICD-10 Code Description Active Date Resolved Date L97.321 Non-pressure chronic ulcer of left ankle limited to breakdown of skin 10/08/2019 10/08/2019 L03.116 Cellulitis of left lower limb 05/24/2019 05/24/2019 Electronic Signature(s) Signed: 07/23/2021 8:20:37 AM By: Worthy Keeler PA-C Entered By: Worthy Keeler on 07/23/2021 08:20:36 [name], [name] ([id number]) -------------------------------------------------------------------------------- Progress Note Details Patient Name: [name] Date of Service:  07/23/2021 8:15 AM Medical Record Number: [id number] Patient Account Number: [id number] Date of Birth/Sex: 1981/05/19 (40 y.o. M) Treating RN: Levora Dredge Primary Care Provider: [name] Other Clinician: Referring Provider: [name] Treating Provider/Extender: [name] Cliche in Treatment: [phone] Subjective Chief Complaint Information obtained from Patient He is here in follow up evaluation for LLE pyoderma ulcer History of Present Illness (HPI) 12/04/16; 41 year old man who comes into the clinic today for review of a wound on the posterior left calf. He tells me that is been there for about a year. He is not a diabetic he does smoke half a pack per day. He was seen in the ER on 11/20/16 felt to have cellulitis around the wound and was given clindamycin. An x-ray did not show osteomyelitis. The patient initially tells me that he has a milk allergy that sets off a pruritic itching rash on his lower legs which she scratches incessantly and he thinks that's what may have set up the wound. He has been using various topical antibiotics and ointments without any effect. He works in a [employer] and is on his feet all day. He does not have a prior history of wounds however he does have the rash on both lower legs the right arm and the ventral aspect of his left arm. These are  excoriations and clearly have had scratching however there are of macular looking areas on both legs including a substantial larger area on the right leg. This does not have an underlying open area. There is no blistering. The patient tells me that 2 years ago in [place] in response to the rash on his legs he saw a dermatologist who told him he had a condition which may be pyoderma gangrenosum although I may be putting words into his mouth. He seemed to recognize this. On further questioning he admits to a 5 year history of quiesced. ulcerative colitis. He is not in any treatment for this. He's had no recent travel 12/11/16; the patient arrives today with his wound and roughly the same condition we've been using silver alginate this is a deep punched out wound with some surrounding erythema but no tenderness. Biopsy I did did not show confirmed pyoderma gangrenosum suggested nonspecific inflammation and vasculitis but does not provide an actual description of what was seen by the pathologist. I'm really not able to understand this We have also received information from the patient's dermatologist in [place] notes from April 2016. This was a doctor Agarwal-antal. 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 He was prescribed topical steroid high potency under occlusion which helped but at this point the patient did not have a deep punched out wound. 12/18/16; the patient's wound is larger in terms of surface area however this surface looks better and there is less depth. The surrounding erythema also is better. The patient states that the wrap we put on came off 2 days ago when he has been using his compression stockings. He we are in the process of getting a dermatology consult. 12/26/16 on evaluation today patient's left lower extremity wound shows evidence of infection with surrounding erythema noted. He has been tolerating the dressing changes but states that he has noted more discomfort. There  is a larger area of erythema surrounding the wound. No fevers, chills, nausea, or vomiting noted at this time. With that being said the wound still does have slough covering the surface. He is not allergic to any medication that he is aware of at this  point. In regard to his right lower extremity he had several regions that are erythematous and pruritic he wonders if there's anything we can do to help that. 01/02/17 I reviewed patient's wound culture which was obtained his visit last week. He was placed on doxycycline at that point. Unfortunately that does not appear to be an antibiotic that would likely help with the situation however the pseudomonas noted on culture is sensitive to Cipro. Also unfortunately patient's wound seems to have a large compared to last week's evaluation. Not severely so but there are definitely increased measurements in general. He is continuing to have discomfort as well he writes this to be a seven out of 10. In fact he would prefer me not to perform any debridement today due to the fact that he is having discomfort and considering he has an active infection on the little reluctant to do so anyway. No fevers, chills, nausea, or vomiting noted at this time. 01/08/17; patient seems dermatology on September 5. I suspect dermatology will want the slides from the biopsy I did sent to their pathologist. I'm not sure if there is a way we can expedite that. In any case the culture I did before I left on vacation 3 weeks ago showed Pseudomonas he was given 10 days of Cipro and per her description of her intake nurses is actually somewhat better this week although the wound is quite a bit bigger than I remember the last time I saw this. He still has 3 more days of Cipro 01/21/17; dermatology appointment tomorrow. He has completed the ciprofloxacin for Pseudomonas. Surface of the wound looks better however he is had some deterioration in the lesions on his right leg. Meantime the left  lateral leg wound we will continue with sample 01/29/17; patient had his dermatology appointment but I can't yet see that note. He is completed his antibiotics. The wound is more superficial but considerably larger in circumferential area than when he came in. This is in his left lateral calf. He also has swollen erythematous areas with superficial wounds on the right leg and small papular areas on both arms. There apparently areas in her his upper thighs and buttocks I did not look at those. Dermatology biopsied the right leg. Hopefully will have their input next week. 02/05/17; patient went back to see his dermatologist who told him that he had a "scratching problem" as well as staph. He is now on a 30 day course of doxycycline and I believe she gave him triamcinolone cream to the right leg areas to help with the itching [not exactly sure but probably triamcinolone]. She apparently looked at the left lateral leg wound although this was not rebiopsied and I think felt to be ultimately part of the same pathogenesis. He is using sample border foam and changing nevus himself. He now has a new open area on the right posterior leg which was his biopsy site I don't have any of the dermatology notes 02/12/17; we put the patient in compression last week with SANTYL to the wound on the left leg and the biopsy. Edema is much better and the depth of the wound is now at level of skin. Area is still the same Biopsy site on the right lateral leg we've also been using santyl with a border foam dressing and he is changing this himself. 02/19/17; Using silver alginate started last week to both the substantial left leg wound and the biopsy site on the right wound. He is tolerating compression  well. Has a an appointment with his primary M.D. tomorrow wondering about diuretics although I'm wondering if the edema problem is actually lymphedema [name], [name] ([id number]) 02/26/17; the patient has been to see his primary  doctor Dr. Jerrel Ivory at [place] our primary care. She started him on Lasix 20 mg and this seems to have helped with the edema. However we are not making substantial change with the left lateral calf wound and inflammation. The biopsy site on the right leg also looks stable but not really all that different. 03/12/17; the patient has been to see vein and vascular Dr. Lucky Cowboy. He has had venous reflux studies I have not reviewed these. I did get a call from his dermatology office. 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 They therefore recommended clobetasol and tetracycline i.e. aggressive treatment for possible pyoderma gangrenosum. 03/26/17; apparently the patient just had reflux studies not an appointment with Dr. dew. She arrives in clinic today having applied clobetasol for 2-3 weeks. He notes over the last 2-3 days excessive drainage having to change the dressing 3-4 times a day and also expanding erythema. 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 Lillia Carmel at [facility] dermatology locally who kindly saw him at my request. A repeat biopsy apparently has confirmed pyoderma gangrenosum and he started on prednisone 60 mg yesterday. My concern was the degree of erythema medially extending from his left leg wound which was either inflammation from pyoderma or cellulitis. I put him on Augmentin however culture of the wound showed Pseudomonas which is quinolone sensitive. I really don't  believe he has cellulitis however in view of everything I will continue and give him a course of Cipro. He is also on doxycycline as an immune modulator for the pyoderma. In addition to his original wound on the left lateral leg with surrounding erythema he has a wound on the right posterior calf which was an original biopsy site done by dermatology. This was felt to represent pathergy from pyoderma gangrenosum 04/16/17; pyoderma gangrenosum. Saw Dr. Lillia Carmel yesterday. He has been using topical antibiotics to both wound areas his original wound on the left and the biopsies/pathergy area on the right. There is definitely some improvement in the inflammation around the wound on the right although the patient states he has increasing sensitivity of the wounds. He is on prednisone 60 and doxycycline 1 as prescribed by Dr. Lillia Carmel. He is covering the topical antibiotic with gauze and putting this in his own compression stocks and changing this daily. He states that Dr. Lottie Rater did a culture of the left leg wound yesterday 05/07/17; pyoderma gangrenosum. The patient saw Dr. Lillia Carmel yesterday and has a follow-up with her in one month. He is still using topical antibiotics to both wounds although he can't recall exactly what type. He is still on prednisone 60 mg. Dr. Lillia Carmel stated that the doxycycline could stop if we were in agreement. 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 The right medial leg was induced by biopsy/pathergy. The area on the right is essentially healed. Still on high-dose prednisone using topical antibiotics to the wound 07/09/17; pyoderma gangrenosum with wounds on the left lateral leg. The right medial leg has closed and remains closed. He is still on prednisone 60. He tells me he missed his last dermatology appointment with Dr. Lillia Carmel but will make another  appointment. He reports that her blood sugar at a recent screen in [place] was high 200's. He was 180 today. 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 I think he is going to require still much longer prednisone perhaps another 3 months before attempting to taper. In the meantime his wound is a lot better. Smaller. He is cleaning this off daily and applying topical antibiotics. When he was last in the clinic I thought about changing to [facility] and actually put in a couple of calls to dermatology although probably not during their business hours. 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 He continues on oral prednisone. He has been using triple antibiotic ointment. There is surface debris and we will transition to [facility] and have him return in 2 weeks. He has lost 30 pounds since his last appointment with lifestyle modification. He may benefit from topical steroid cream for treatment this can be considered at a later date. 08/22/17 on evaluation today patient appears to actually be doing rather well in regard to his left lateral lower extremity ulcer. He has actually been managed by Dr. Dellia Nims most recently. Patient is currently on oral steroids at this time. This seems to have been of benefit for him. Nonetheless his last visit was actually with Leah on 08/06/17. Currently he is not utilizing any topical steroid creams although this could be of benefit as well. No fevers, chills, nausea, or vomiting noted at this time. 09/05/17 on evaluation today patient appears to be doing better in regard to his left lateral lower extremity ulcer. He has been tolerating the dressing changes without complication. He is using Santyl with good effect. Overall I'm very pleased with how things are standing at this point. Patient likewise is happy that this is doing better. 09/19/17 on  evaluation today patient actually appears to be doing rather well in regard to his left lateral lower extremity ulcer. 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 He's not having any significant pain. 10/03/17 on evaluation today patient appears to be doing excellent in regard to his lower extremity wound on the left secondary to Pyoderma gangrenosum. He has been tolerating the Santyl without complication and in general I feel like he's making good progress. 10/17/17 on evaluation today patient appears to be doing very well in regard to his left lateral lower surety ulcer. He has been tolerating the dressing changes without complication. There does not appear to be any evidence of infection he's alternating the Santyl and the triple antibiotic ointment every other day this seems to be doing well for him. 11/03/17 on evaluation today patient appears to be doing very well in regard to his left lateral lower extremity ulcer. He is been tolerating the dressing changes without complication which is good news. Fortunately there does not  appear to be any evidence of infection which is also great news. Overall is doing excellent they are starting to taper down on the prednisone is down to 40 mg at this point it also started topical clobetasol for him. 11/17/17 on evaluation today patient appears to be doing well in regard to his left lateral lower surety ulcer. He's been tolerating the dressing changes without complication. He does note that he is having no pain, no excessive drainage or discharge, and overall he feels like things are going about how he would expect and hope they would. Overall he seems to have no evidence of infection at this time in my opinion which is good news. 12/04/17-He is seen in follow-up evaluation for right lateral lower extremity ulcer. He has been applying topical steroid cream. Today's measurement show  slight increase in size. Over the next 2 weeks we will transition to every other day Santyl and steroid cream. He has been encouraged to monitor for changes and notify clinic with any concerns 12/15/17 on evaluation today patient's left lateral motion the ulcer and fortunately is doing worse again at this point. This just since last week to this week has close to doubled in size according to the patient. I did not seeing last week's I do not have a visual to compare this to in our system was also down so we do not have all the charts and at this point. Nonetheless it does have me somewhat concerned in regard to the fact that again he was worried enough about it he has contact the dermatology that placed them back on the full strength, 50 mg a day of the prednisone that he was taken previous. He continues to alternate using clobetasol along with Santyl at this point. He is obviously somewhat frustrated. [name], [name] ([id number]) 12/22/17 on evaluation today patient appears to be doing a little worse compared to last evaluation. Unfortunately the wound is a little deeper and slightly larger than the last week's evaluation. With that being said he has made some progress in regard to the irritation surrounding at this time unfortunately despite that progress that's been made he still has a significant issue going on here. 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 For that reason I discussed with him today the possibility of perform a wound culture to ensure there's nothing overtly infected. 01/06/18 on evaluation today patient's wound is larger and deeper than previously evaluated. With that being said it did appear that his wound was infected after my last evaluation with him. Subsequently I did end up prescribing a prescription for Bactrim DS which she has  been taking and having no complication with. Fortunately there does not appear to be any evidence of infection at this point in time as far as anything spreading, no want to touch, and overall I feel like things are showing signs of improvement. 01/13/18 on evaluation today patient appears to be even a little larger and deeper than last time. There still muscle exposed in the base of the wound. Nonetheless he does appear to be less erythematous I do believe inflammation is calming down also believe the infection looks like it's probably resolved at this time based on what I'm seeing. No fevers, chills, nausea, or vomiting noted at this time. 01/30/18 on evaluation today patient actually appears to visually look better for the most part. Unfortunately  those visually this looks better he does seem to potentially have what may be an abscess in the muscle that has been noted in the central portion of the wound. This is the first time that I have noted what appears to be fluctuance in the central portion of the muscle. With that being said I'm somewhat more concerned about the fact that this might indicate an abscess formation at this location. I do believe that an ultrasound would be appropriate. This is likely something we need to try to do as soon as possible. He has been switch to mupirocin ointment and he is no longer using the steroid ointment as prescribed by dermatology he sees them again next week he's been decreased from 60 to 40 mg of prednisone. 03/09/18 on evaluation today patient actually appears to be doing a little better compared to last time I saw him. There's not as much erythema surrounding the wound itself. He I did review his most recent infectious disease note which was dated 02/24/18. He saw Dr. Michel Bickers in [place]. 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 Patient is now off of antibiotics  and they are continually observing this. There seems to be no change in the past two weeks in my pinion based on what the patient says and what I see today compared to what Dr. Megan Salon likely saw two weeks ago. No fevers, chills, nausea, or vomiting noted at this time. 03/23/18 on evaluation today patient's wound actually appears to be showing signs of improvement which is good news. He is currently still on the Dapsone. He is also working on tapering the prednisone to get off of this and Dr. Lottie Rater is working with him in this regard. Nonetheless overall I feel like the wound is doing well it does appear based on the infectious disease note that I reviewed from Dr. 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 I did also review the results of his ultrasound of left lower extremity which revealed there was a dentist tissue in the base of the wound without an abscess noted. 04/06/18 on evaluation today the patient's left lateral lower extremity ulcer actually appears to be doing fairly well which is excellent news. There does not appear to be any evidence of infection at this time which is also great news. Overall he still does have a significantly large ulceration although little by little he seems to be making progress. He is down to 10 mg a day of the prednisone. 04/20/18 on evaluation today patient actually appears to be doing excellent at this time in regard to his left lower extremity ulcer. He's making signs of good progress unfortunately this is taking much longer than we would really like to see but nonetheless he is making progress. Fortunately there does not appear to be any evidence of infection at this time. No fevers, chills, nausea, or vomiting noted at this time. The patient has not been using the Santyl due to the cost he hadn't got in this field yet. He's mainly been using  the antibiotic ointment topically. Subsequently he also tells me that he really has not been scrubbing in the shower I think this would be helpful again as I told him it doesn't have to be anything too aggressive to even make  it believe just enough to keep it free of some of the loose slough and biofilm on the wound surface. 05/11/18 on evaluation today patient's wound appears to be making slow but sure progress in regard to the left lateral lower extremity ulcer. He is been tolerating the dressing changes without complication. Fortunately there does not appear to be any evidence of infection at this time. He is still just using triple antibiotic ointment along with clobetasol occasionally over the area. He never got the Santyl and really does not seem to intend to in my pinion. 06/01/18 on evaluation today patient appears to be doing a little better in regard to his left lateral lower extremity ulcer. He states that overall he does not feel like he is doing as well with the Dapsone as he did with the prednisone. Nonetheless he sees his dermatologist later today and is gonna talk to them about the possibility of going back on the prednisone. Overall again I believe that the wound would be better if you would utilize Santyl but he really does not seem to be interested in going back to the [place] at this point. He has been using triple antibiotic ointment. 06/15/18 on evaluation today patient's wound actually appears to be doing about the same at this point. Fortunately there is no signs of infection at this time. He has made slight improvements although he continues to not really want to clean the wound bed at this point. He states that he just doesn't mess with it he doesn't want to cause any problems with everything else he has going on. He has been on medication, antibiotics as prescribed by his dermatologist, for a staff infection of his lower extremities which is really drying out now and looking  much better he tells me. Fortunately there is no sign of overall infection. 06/29/18 on evaluation today patient appears to be doing well in regard to his left lateral lower surety ulcer all things considering. Fortunately his staff infection seems to be greatly improved compared to previous. He has no signs of infection and this is drying up quite nicely. He is still the doxycycline for this is no longer on cental, Dapsone, or any of the other medications. His dermatologist has recommended possibility of an infusion but right now he does not want to proceed with that. 07/13/18 on evaluation today patient appears to be doing about the same in regard to his left lateral lower surety ulcer. Fortunately there's no signs of infection at this time which is great news. Unfortunately he still builds up a significant amount of Slough/biofilm of the surface of the wound he still is not really cleaning this as he should be appropriately. Again I'm able to easily with saline and gauze remove the majority of this on the surface which if you would do this at home would likely be a dramatic improvement for him as far as getting the area to improve. Nonetheless overall I still feel like he is making progress is just very slow. I think Santyl will be of benefit for him as well. Still he has not gotten this as of this point. 07/27/18 on evaluation today patient actually appears to be doing little worse in regards of the erythema around the periwound region of the wound he also tells me that he's been having more drainage currently compared to what he was experiencing last time I saw him. He states not quite as bad as what he had because this was infected previously but nonetheless  is still appears to be doing poorly. Fortunately there is no evidence of systemic infection at this point. The patient tells me that he is not going to be able to afford the Santyl. He is still waiting to hear about the infusion therapy with  his dermatologist. Apparently she wants an updated colonoscopy first. DAIJON, WENKE ([id number]) 08/10/18 on evaluation today patient appears to be doing better in regard to his left lateral lower extremity ulcer. Fortunately he is showing signs of improvement in this regard he's actually been approved for Remicade infusion's as well although this has not been scheduled as of yet. Fortunately there's no signs of active infection at this time in regard to the wound although he is having some issues with infection of the right lower extremity is been seen as dermatologist for this. Fortunately they are definitely still working with him trying to keep things under control. 09/07/18 on evaluation today patient is actually doing rather well in regard to his left lateral lower extremity ulcer. He notes these actually having some hair grow back on his extremity which is something he has not seen in years. He also tells me that the pain is really not giving them any trouble at this time which is also good news overall she is very pleased with the progress he's using a combination of the mupirocin along with the probate is all mixed. 09/21/18 on evaluation today patient actually appears to be doing fairly well all things considered in regard to his looks from the ulcer. He's been tolerating the dressing changes without complication. Fortunately there's no signs of active infection at this time which is good news he is still on all antibiotics or prevention of the staff infection. He has been on prednisone for time although he states it is gonna contact his dermatologist and see if she put them on a short course due to some irritation that he has going on currently. Fortunately there's no evidence of any overall worsening this is going very slow I think cental would be something that would be helpful for him although he states that $50 for tube is quite expensive. He therefore is not willing to get that at this  point. 10/06/18 on evaluation today patient actually appears to be doing decently well in regard to his left lateral leg ulcer. He's been tolerating the dressing changes without complication. Fortunately there's no signs of active infection at this time. Overall I'm actually rather pleased with the progress he's making although it's slow he doesn't show any signs of infection and he does seem to be making some improvement. I do believe that he may need a switch up and dressings to try to help this to heal more appropriately and quickly. 10/19/18 on evaluation today patient actually appears to be doing better in regard to his left lateral lower extremity ulcer. This is shown signs of having much less Slough buildup at this point due to the fact he has been using the [employer]. Obviously this is very good news. The overall size of the wound is not dramatically smaller but again the appearance is. 11/02/18 on evaluation today patient actually appears to be doing quite well in regard to his lower [place] ulcer. A lot of the skin around the ulcer is actually somewhat irritating at this point this seems to be more due to the dressing causing irritation from the adhesive that anything else. Fortunately there is no signs of active infection at this time. 11/24/18 on evaluation today patient appears  to be doing a little worse in regard to his overall appearance of his lower extremity ulcer. There's more erythema and warmth around the wound unfortunately. He is currently on doxycycline which he has been on for some time. With that being said I'm not sure that seems to be helping with what appears to possibly be an acute cellulitis with regard to his left lower extremity ulcer. No fevers, chills, nausea, or vomiting noted at this time. 12/08/18 on evaluation today patient's wounds actually appears to be doing significantly better compared to his last evaluation. He has been using Santyl along with alternating tripling  about appointment as well as the steroid cream seems to be doing quite well and the wound is showing signs of improvement which is excellent news. Fortunately there's no evidence of infection and in fact his culture came back negative with only normal skin flora noted. 12/21/2018 upon evaluation today patient actually appears to be doing excellent with regard to his ulcer. This is actually the best that I have seen it since have been helping to take care of him. It is both smaller as well as less slough noted on the surface of the wound and seems to be showing signs of good improvement with new skin growing from the edges. He has been using just the triamcinolone he does wonder if he can get a refill of that ointment today. 01/04/2019 upon evaluation today patient actually appears to be doing well with regard to his left lateral lower extremity ulcer. With that being said it does not appear to be that he is doing quite as well as last time as far as progression is concerned. There does not appear to be any signs of infection or significant irritation which is good news. With that being said I do believe that he may benefit from switching to a collagen based dressing based on how clean The wound appears. 01/18/2019 on evaluation today patient actually appears to be doing well with regard to his wound on the left lower extremity. He is not made a lot of progress compared to where we were previous but nonetheless does seem to be doing okay at this time which is good news. There is no signs of active infection which is also good news. My only concern currently is I do wish we can get him into utilizing the collagen dressing his insurance would not pay for the supplies that we ordered although it appears that he may be able to order this through his supply company that he typically utilizes. This is [employer]. Nonetheless he did try to order it during the office visit today and it appears this did go through.  We will see if he can get that it is a different brand but nonetheless he has collagen and I do think will be beneficial. 02/01/2019 on evaluation today patient actually appears to be doing a little worse today in regard to the overall size of his wounds. Fortunately there is no signs of active infection at this time. That is visually. Nonetheless when this is happened before it was due to infection. For that reason were somewhat concerned about that this time as well. 02/08/2019 on evaluation today patient unfortunately appears to be doing slightly worse with regard to his wound upon evaluation today. Is measuring a little deeper and a little larger unfortunately. I am not really sure exactly what is causing this to enlarge he actually did see his dermatologist she is going to see about initiating Humira  for him. Subsequently she also did do steroid injections into the wound itself in the periphery. Nonetheless still nonetheless he seems to be getting a little bit larger he is gone back to just using the steroid cream topically which I think is appropriate. I would say hold off on the collagen for the time being is definitely a good thing to do. Based on the culture results which we finally did get the final result back regarding it shows staph as the bacteria noted again that can be a normal skin bacteria based on the fact however he is having increased drainage and worsening of the wound measurement wise I would go ahead and place him on an antibiotic today I do believe for this. 02/15/2019 on evaluation today patient actually appears to be doing somewhat better in regard to his ulcer. There is no signs of worsening at this time I did review his culture results which showed evidence of Staphylococcus aureus but not MRSA. Again this could just be more related to the normal skin bacteria although he states the drainage has slowed down quite a bit he may have had a mild infection not just  colonization. And was much smaller and then since around10/04/2019 on evaluation today patient appears to be doing unfortunately worse as far as the size of the wound. I really feel like that this is steadily getting larger again it had been doing excellent right at the beginning of September we have seen a steady increase in the area of the wound it is almost 2-1/2 times the size it was on September 1. Obviously this is a bad trend this is not wanting to see. For that reason we went back to using just the topical triamcinolone cream which does seem to help with inflammation. I checked him for bacteria by way of culture and nothing showed positive there. I am considering giving him a short course of a tapering steroid Dalon Reichart, Beth ([id number]) today to see if that is can be beneficial for him. The patient is in agreement with giving that a try. 03/08/2019 on evaluation today patient appears to be doing very well in comparison to last evaluation with regard to his lower extremity ulcer. This is showing signs of less inflammation and actually measuring slightly smaller compared to last time every other week over the past month and a half he has been measuring larger larger larger. Nonetheless I do believe that the issue has been inflammation the prednisone does seem to have been beneficial for him which is good news. No fevers, chills, nausea, vomiting, or diarrhea. 03/22/2019 on evaluation today patient appears to be doing about the same with regard to his leg ulcer. He has been tolerating the dressing changes without complication. With that being said the wound seems to be mostly arrested at its current size but really is not making any progress except for when we prescribed the prednisone. He did show some signs of dropping as far as the overall size of the wound during that interval week. Nonetheless this is something he is not on long-term at this point and unfortunately I think he is  getting need either this or else the Humira which his dermatologist has discussed try to get approval for. With that being said he will be seeing his dermatologist on the 11th of this month that is November. 04/19/2019 on evaluation today patient appears to be doing really about the same the wound is measuring slightly larger compared to last time I saw  him. He has not been into the office since November 2 due to the fact that he unfortunately had Covid as that his entire family. He tells me that it was rough but they did pull-through and he seems to be doing much better. Fortunately there is no signs of active infection at this time. No fevers, chills, nausea, vomiting, or diarrhea. 05/10/2019 on evaluation today patient unfortunately appears to be doing significantly worse as compared to last time I saw him. He does tell me that he has had his first dose of Humira and actually is scheduled to get the next one in the upcoming week. With that being said he tells me also that in the past several days he has been having a lot of issues with green drainage she showed me a picture this is more blue-green in color. He is also been having issues with increased sloughy buildup and the wound does appear to be larger today. Obviously this is not the direction that we want everything to take based on the starting of his Humira. Nonetheless I think this is definitely a result of likely infection and to be honest I think this is probably Pseudomonas causing the infection based on what I am seeing. 05/24/2019 on evaluation today patient unfortunately appears to be doing significantly worse compared to his prior evaluation with me 2 weeks ago. I did review his culture results which showed that he does have Staph aureus as well as Pseudomonas noted on the culture. Nonetheless the Levaquin that I prescribed for him does not appear to have been appropriate and in fact he tells me he is no longer experiencing the  green drainage and discharge that he had at the last visit. Fortunately there is no signs of active infection at this time which is good news although the wound has significantly worsened it in fact is much deeper than it was previous. We have been utilizing up to this point triamcinolone ointment as the prescription topical of choice but at this time I really feel like that the wound is getting need to be packed in order to appropriately manage this due to the deeper nature of the wound. Therefore something along the lines of an alginate dressing may be more appropriate. 05/31/2019 upon inspection today patient's wound actually showed signs of doing poorly at this point. Unfortunately he just does not seem to be making any good progress despite what we have tried. He actually did go ahead and pick up the Cipro and start taking that as he was noticing more green drainage he had previously completed the Levaquin that I prescribed for him as well. Nonetheless he missed his appointment for the seventh last week on Wednesday with the wound care center and [facility] where his dermatologist referred him. Obviously I do think a second opinion would be helpful at this point especially in light of the fact that the patient seems to be doing so poorly despite the fact that we have tried everything that I really know how at this point. The only thing that ever seems to have helped him in the past is when he was on high doses of continual steroids that did seem to make a difference for him. 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 06/07/2019 upon evaluation today patient unfortunately  appears to be doing worse yet again with regard to his wound. In fact I am starting to question whether or not he may have a fluid pocket in the muscle at this point  based on the bulging and the soft appearance to the central portion of the muscle area. There is not anything draining from the muscle itself at this time which is good news but nonetheless the wound is expanding. I am not really seeing any results of the Humira as far as overall wound progression based on what I am seeing at this point. The patient has been referred for second opinion with regard to his wound to the [facility] wound care center by his dermatologist which I definitely am not in opposition to. Unfortunately we tried multiple dressings in the past including collagen, alginate, and at one point even Hydrofera Blue. With that being said he is never really used it for any significant amount of time due to the fact that he often complains of pain associated with these dressings and then will go back to either using the Santyl which she has done intermittently or more frequently the triamcinolone. He is also using his own compression stockings. We have wrapped him in the past but again that was something else that he really was not a big fan of. 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 He seemed to do much better when he was on the steroids although honestly I understand that the steroids are not necessarily the best medication to be on long-term obviously 06/14/2019 on evaluation today patient appears to be doing actually a little bit better with regard to the overall appearance with his leg. Unfortunately he does continue to have issues with what appears to be some fluid underneath the muscle although he did see the wound specialty center at [facility] last week their main goals were  to see about infusion therapy in place of the Humira as they feel like that is not quite strong enough. They also recommended that we continue with the treatment otherwise as we are they felt like that was appropriate and they are okay with him continuing to follow-up here with [place] in that regard. With that being said they are also sending him to the vein specialist there to see about vein stripping and if that would be of benefit for him. Subsequently they also did not really address whether or not an ultrasound of the muscle area to see if there is anything that needs to be addressed here would be appropriate or not. For that reason I discussed this with him last week I think we may proceed down that road at this point. 06/21/2019 upon evaluation today patient's wound actually appears to be doing slightly better compared to previous evaluations. I do believe that he has made a difference with regard to the progression here with the use of oral steroids. Again in the past has been the only thing that is really calm things down. He does tell me that from [facility] is gotten a good news from there that there are no further vein stripping that is necessary at this point. I do not  have that available for review today although the patient did relay this to me. He also did obtain and have the ultrasound of the wound completed which I did sign off on today. It does appear that there is no fluid collection under the muscle this is likely then just edematous tissue in general. That is also good news. Overall I still believe the inflammation is the main issue here. He did inquire about the possibility of a wound VAC again with the muscle protruding like it is I am not really sure whether the wound VAC is necessarily ideal or not. That is something we will have to consider although I do believe he may need compression wrapping to try to help with edema control which could potentially be of benefit. 06/28/2019 on  evaluation today patient appears to be doing slightly better measurement wise although this is not terribly smaller he least seems to be trending towards that direction. With that being said he still seems to have purulent drainage noted in the wound bed at this time. He has been on Levaquin followed by Cipro over the past month. Unfortunately he still seems to have some issues with active infection at this time. I did perform a culture last week in order to evaluate and see if indeed there was still anything going on. Subsequently the culture did come back Thon, Keighan ([id number]) showing Pseudomonas which is consistent with the drainage has been having which is blue-green in color. He also has had an odor that again was somewhat consistent with Pseudomonas as well. Long story short it appears that the culture showed an intermediate finding with regard to how well the Cipro will work for the Pseudomonas infection. Subsequently being that he does not seem to be clearing up and at best what we are doing is just keeping this at [place] I think he may need to see infectious disease to discuss IV antibiotic options. 07/05/2019 upon evaluation today patient appears to be doing okay in regard to his leg ulcer. He has been tolerating the dressing changes at this point without complication. Fortunately there is no signs of active infection at this time which is good news. No fevers, chills, nausea, vomiting, or diarrhea. With that being said he does have an appointment with infectious disease tomorrow and his primary care on Wednesday. Again the reason for the infectious disease referral was due to the fact that he did not seem to be fully resolving with the use of oral antibiotics and therefore we were thinking that IV antibiotic therapy may be necessary secondary to the fact that there was an intermediate finding for how effective the Cipro may be. Nonetheless again he has been having a lot of purulent and even  green drainage. Fortunately right now that seems to have calmed down over the past week with the reinitiation of the oral antibiotic. Nonetheless we will see what Dr. Megan Salon has to say. 07/12/2019 upon evaluation today patient appears to be doing about the same at this point in regard to his left lower extremity ulcer. Fortunately there is no signs of active infection at this time which is good news I do believe the Levaquin has been beneficial I did review Dr. Hale Bogus note and to be honest I agree that the patient's leg does appear to be doing better currently. What we found in the past as he does not seem to really completely resolve he will stop the antibiotic and then subsequently things will revert back to having issues  with blue-green drainage, increased pain, and overall worsening in general. Obviously that is the reason I sent him back to infectious disease. 07/19/2019 upon evaluation today patient appears to be doing roughly the same in size there is really no dramatic improvement. He has started back on the Levaquin at this point and though he seems to be doing okay he did still have a lot of blue/green drainage noted on evaluation today unfortunately. I think that this is still indicative more likely of a Pseudomonas infection as previously noted and again he does see Dr. Megan Salon in just a couple of days. I do not know that were really able to effectively clear this with just oral antibiotics alone based on what I am seeing currently. Nonetheless we are still continue to try to manage as best we can with regard to the patient and his wound. I do think the wrap was helpful in decreasing the edema which is excellent news. No fevers, chills, nausea, vomiting, or diarrhea. 07/26/2019 upon evaluation today patient appears to be doing slightly better with regard to the overall appearance of the muscle there is no dark discoloration centrally. Fortunately there is no signs of active infection at  this time. No fevers, chills, nausea, vomiting, or diarrhea. Patient's wound bed currently the patient did have an appointment with Dr. Megan Salon at infectious disease last week. With that being said Dr. Megan Salon the patient states was still somewhat hesitant about put him on any IV antibiotics he wanted [place] to repeat cultures today and then see where things go going forward. He does look like Dr. Megan Salon because of some improvement the patient did have with the Levaquin wanted [place] to see about repeating cultures. If it indeed grows the Pseudomonas again then he recommended a possibility of considering a PICC line placement and IV antibiotic therapy. He plans to see the patient back in 1 to 2 weeks. 08/02/2019 upon evaluation today patient appears to be doing poorly with regard to his left lower extremity. We did get the results of his culture back it shows that he is still showing evidence of Pseudomonas which is consistent with the purulent/blue-green drainage that he has currently. Subsequently the culture also shows that he now is showing resistance to the oral fluoroquinolones which is unfortunate as that was really the only thing to treat the infection prior. I do believe that he is looking like this is going require IV antibiotic therapy to get this under control. Fortunately there is no signs of systemic infection at this time which is good news. The patient does see Dr. Megan Salon tomorrow. 08/09/2019 upon evaluation today patient appears to be doing better with regard to his left lower extremity ulcer in regard to the overall appearance. He is currently on IV antibiotic therapy. As ordered by Dr. Megan Salon. Currently the patient is on ceftazidime which she is going to take for the next 2 weeks and then follow-up for 4 to 5-week appointment with Dr. Megan Salon. The patient started this this past Friday symptoms have not for a total of 3 days currently in full. 08/16/2019 upon evaluation today patient's  wound actually does show muscle in the base of the wound but in general does appear to be much better as far as the overall evidence of infection is concerned. In fact I feel like this is for the most part cleared up he still on the IV antibiotics he has not completed the full course yet but I think he is doing much better which  is excellent news. 08/23/2019 upon evaluation today patient appears to be doing about the same with regard to his wound at this point. He tells me that he still has pain unfortunately. Fortunately there is no evidence of systemic infection at this time which is great news. There is significant muscle protrusion. 09/13/19 upon evaluation today patient appears to be doing about the same in regard to his leg unfortunately. He still has a lot of drainage coming from the ulceration there is still muscle exposed. With that being said the patient's last wound culture still showed an intermediate finding with regard to the Pseudomonas he still having the bluish/green drainage as well. Overall I do not know that the wound has completely cleared of infection at this point. Fortunately there is no signs of active infection systemically at this point which is good news. 09/20/2019 upon evaluation today patient's wound actually appears to be doing about the same based on what I am seeing currently. I do not see any signs of systemic infection he still does have evidence of some local infection and drainage. He did see Dr. Megan Salon last week and Dr. Megan Salon states that he probably does need a different IV antibiotic although he does not want to put him on this until the patient begins the Remicade infusion which is actually scheduled for about 10 days out from today on 13 May. Following that time Dr. Megan Salon is good to see him back and then will evaluate the feasibility of starting him on the IV antibiotic therapy once again at that point. I do not disagree with this plan I do believe as Dr.  Megan Salon stated in his note that I reviewed today that the patient's issue is multifactorial with the pyoderma being 1 aspect of this that were hoping the Remicade will be helpful for her. In the meantime I think the gentamicin is, helping to keep things under decent okay control in regard to the ulcer. 09/27/2019 upon evaluation today patient appears to be doing about the same with regard to his wound still there is a lot of muscle exposure though he does have some hyper granulation tissue noted around the edge and actually some granulation tissue starting to form over the muscle which is actually good news. Fortunately there is no evidence of active infection which is also good news. His pain is less at this point. 5/21; this is a patient I have not seen in a long time. 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 He has a large wound on the left lateral leg with protruding muscle. He comes in the clinic today showing the same area on his left medial ankle. He says there is been a spot there for some time although we have not previously defined this. Today he has a clearly defined area with slight amount of skin breakdown surrounded by raised areas with a purplish hue in color. This is not painful he says it is irritated. 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 He still has muscle protruding from the lateral portion of his left leg but fortunately the new area that they were concerned about at his last visit does not appear to have opened at this point. He is currently on Remicade infusions and seems to be doing better in my opinion in fact the wound itself seems to be overall much better. The purplish discoloration that he did have  seems to have resolved and I think that is a good sign that hopefully the Remicade is doing its job. He does Beazley, Jaysun  ([id number]) have some biofilm noted over the surface of the wound. 11/01/2019 on evaluation today patient's wound actually appears to be doing excellent at this time. Fortunately there is no evidence of active infection and overall I feel like he is making great progress. The Remicade seems to be due excellent job in my opinion. 11/08/19 evaluation today vision actually appears to be doing quite well with regard to his weight ulcer. He's been tolerating dressing changes without complication. Fortunately there is no evidence of infection. No fevers, chills, nausea, or vomiting noted at this time. Overall states that is having more itching than pain which is actually a good sign in my opinion. 12/13/2019 upon evaluation today patient appears to be doing well today with regard to his wound. He has been tolerating the dressing changes without complication. Fortunately there is no sign of active infection at this time. No fevers, chills, nausea, vomiting, or diarrhea. Overall I feel like the infusion therapy has been very beneficial for him. 01/06/2020 on evaluation today patient appears to be doing well with regard to his wound. This is measuring smaller and actually looks to be doing better. Fortunately there is no signs of active infection at this point. No fevers, chills, nausea, vomiting, or diarrhea. With that being said he does still have the blue-green drainage but this does not seem to be causing any significant issues currently. He has been using the gentamicin that does seem to be keeping things under decent control at this point. He goes later this morning for his next infusion therapy for the pyoderma which seems to also be very beneficial. 02/07/2020 on evaluation today patient appears to be doing about the same in regard to his wounds currently. Fortunately there is no signs of active infection systemically he does still have evidence of local infection still using gentamicin. He also is showing  some signs of improvement albeit slowly I do feel like we are making some progress here. 02/21/2020 upon evaluation today patient appears to be making some signs of improvement the wound is measuring a little bit smaller which is great news and overall I am very pleased with where he stands currently. He is going to be having infusion therapy treatment on the 15th of this month. Fortunately there is no signs of active infection at this time. 03/13/2020 I do believe patient's wound is actually showing some signs of improvement here which is great news. He has continue with the infusion therapy through rheumatology/dermatology at [facility]. That does seem to be beneficial. I still think he gets as much benefit from this as he did from the prednisone initially but nonetheless obviously this is less harsh on his body that the prednisone as far as they are concerned. 03/31/2020 on evaluation today patient's wound actually showing signs of some pretty good improvement in regard to the overall appearance of the wound bed. There is still muscle exposed though he does have some epithelial growth around the edges of the wound. Fortunately there is no signs of active infection at this time. No fevers, chills, nausea, vomiting, or diarrhea. 04/24/2020 upon evaluation today patient appears to be doing about the same in regard to his leg ulcer. He has been tolerating the dressing changes without complication. Fortunately there is no signs of active infection at this time. No fevers, chills, nausea, vomiting, or diarrhea. With that  being said he still has a lot of irritation from the bandaging around the edges of the wound. We did discuss today the possibility of a referral to plastic surgery. 05/22/2020 on evaluation today patient appears to be doing well with regard to his wounds all things considered. He has not been able to get the Chantix apparently there is a recall nurse that I was unaware of put out by [employer]  involuntarily. Nonetheless for now I am and I have to do some research into what may be the best option for him to help with quitting in regard to smoking and we discussed that today. 06/26/2020 upon evaluation today patient appears to be doing well with regard to his wound from the standpoint of infection I do not see any signs of infection at this point. With that being said unfortunately he is still continuing to have issues with muscle exposure and again he is not having a whole lot of new skin growth unfortunately. There does not appear to be any signs of active infection at this time. No fevers, chills, nausea, vomiting, or diarrhea. 07/10/2020 upon evaluation today patient appears to be doing a little bit more poorly currently compared to where he was previous. I am concerned currently about an active infection that may be getting worse especially in light of the increased size and tenderness of the wound bed. No fevers, chills, nausea, vomiting, or diarrhea. 07/24/2020 upon evaluation today patient appears to be doing poorly in regard to his leg ulcer. He has been tolerating the dressing changes without complication but unfortunately is having a lot of discomfort. 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 Subsequently I think he is going require possibly IV antibiotics to get this under control. I am very concerned about the severity of his infection and the amount of discomfort he is having. 07/31/2020 upon evaluation today patient appears to be doing about the same in regard to his leg wound. He did see Dr. Megan Salon and Dr. Megan Salon is actually going to start him on IV antibiotics. He goes for the PICC line tomorrow. With that being said there do not have that run for 2 weeks and then see how things are doing and depending on how he is progressing they may extend that a little longer. Nonetheless I am glad this is getting  ready to be in place and definitely feel it may help the patient. In the meantime is been using mainly triamcinolone to the wound bed has an anti-inflammatory. 08/07/2020 on evaluation today patient appears to be doing well with regard to his wound compared even last week. In the interim he has gotten the PICC line placed and overall this seems to be doing excellent. There does not appear to be any evidence of infection which is great news systemically although locally of course has had the infection this appears to be improving with the use of the antibiotics. 08/14/2020 upon evaluation today patient's wound actually showing signs of excellent improvement. Overall the irritation has significantly improved the drainage is back down to more of a normal level and his pain is really pretty much nonexistent compared to what it was. Obviously I think that this is significantly improved secondary to the IV antibiotic therapy which has made all the difference in the world. Again he had a resistant form of Pseudomonas for which oral antibiotics just was not cutting it. Nonetheless I do think that still we need to consider the  possibility of a surgical closure for this wound is been open so long and to be honest with muscle exposed I think this can be very hard to get this to close outside of this although definitely were still working to try to do what we can in that regard. 08/21/2020 upon evaluation today patient appears to be doing very well with regard to his wounds on the left lateral lower extremity/calf area. Fortunately there does not appear to be signs of active infection which is great news and overall very pleased with where things stand today. He is actually wrapping up his treatment with IV antibiotics tomorrow. After that we will see where things go from there. 08/28/2020 upon evaluation today patient appears to be doing decently well with regard to his leg ulcer. There does not appear to be any signs  of Blocher, Draco ([id number]) active infection which is great news and overall very pleased with where things stand today. No fevers, chills, nausea, vomiting, or diarrhea. 09/18/2020 upon evaluation today patient appears to be doing well with regard to his infection which I feel like is better. Unfortunately he is not doing as well with regard to the overall size of the wound which is not nearly as good at this point. I feel like that he may be having an issue here with the pyoderma being somewhat out of control. I think that he may benefit from potentially going back and talking to the dermatologist about what to do from the pyoderma standpoint. I am not certain if the infusions are helping nearly as much is what the prednisone did in the past. 10/02/2020 upon evaluation today patient appears to be doing well with regard to his leg ulcer. He did go to the Psychiatric nurse. Unfortunately they feel like there is a 10% chance that most that he would be able to heal and that the skin graft would take. Obviously this has led him to not be able to go down that path as far as treatment is concerned. Nonetheless he does seem to be doing a little bit better with the prednisone that I gave him last time. I think that he may need to discuss with dermatology the possibility of long-term prednisone as that seems to be what is most helpful for him to be perfectly honest. I am not sure the Remicade is really doing the job. 10/17/2020 upon evaluation today patient appears to be doing a little better in regard to his wound. In fact the case has been since we did the prednisone on May 2 for him that we have noticed a little bit of improvement each time we have seen a size wise as well as appearance wise as well as pain wise. I think the prednisone has had a greater effect then the infusion therapy has to be perfectly honest. With that being said the patient also feels significantly better compared to what he was previous.  All of this is good news but nonetheless I am still concerned about the fact that again we are really not set up to long-term manage him as far as prednisone is concerned. Obviously there are things that you need to be watched I completely understand the risk of prednisone usage as well. 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 With all that being said I do believe that we can give him another round of the prednisone which she is requesting today because of the improvement that  he seen since we did that first round. 10/30/2020 upon evaluation today patient's wound actually is showing signs of doing quite well. There does not appear to be any evidence of infection which is great news and overall very pleased with where things stand today. No fevers, chills, nausea, vomiting, or diarrhea. He tells me that the prednisone still has seem to have helped he wonders if we can extend that for just a little bit longer. He did not have the appointment with a dermatologist although he did have an infusion appointment last Friday. That was at [facility]. With that being said he tells me he could not do both that as well as the appointment with the physician on the same day therefore that is can have to be rescheduled. I really want to see if there is anything they feel like that could be done differently to try to help this out as I am not really certain that the infusions are helping significantly here. 11/13/2020 upon evaluation today patient unfortunately appears to be doing somewhat poorly in regard to his wound I feel like this is actually worsening from the standpoint of the pyoderma spreading. I still feel like that he may need something different as far as trying to manage this going forward. Again we did the prednisone unfortunately his blood sugars are not doing so well because of this. Nonetheless I believe that the patient likely needs to try topical steroid. We  have done triamcinolone for a while I think going with something stronger such as clobetasol could be beneficial again this is not something I do lightly I discussed this with the patient that again this does not normally put underneath an occlusive dressing. Nonetheless I think a thin film as such could help with some of the stronger anti-inflammatory effects. We discussed this today. He would like to try to give this a trial for the next couple weeks. I definitely think that is something that we can do. Evaluate7/03/2021 and today patient's wound bed actually showed signs of doing really about the same. There was a little expansion of the size of the wound and that leading edge that we done looking out although the clobetasol does seem to have slowed this down a bit in my opinion. There is just 1 small area that still seems to be progressing based on what I see. Nonetheless I am concerned about the fact this does not seem to be improving if anything seems to be doing a little bit worse. I do not know that the infusions are really helping him much as next infusion is August 5 his appointment with dermatology is July 25. Either way I really think that we need to have a conversation potentially about this and I am actually going to see if I can talk with Dr. Lillia Carmel in order to see where things stand as well. 12/11/2020 upon evaluation today patient appears to be doing worse in regard to his leg ulcer. Unfortunately I just do not think this is making the progress that I would like to see at this point. Honestly he does have an appointment with dermatology and this is in 2 days. I am wondering what they may have to offer to help with this. Right now what I am seeing is that he is continuing to show signs of worsening little by little. Obviously that is not great at all. Is the exact opposite of what we are looking for. 12/18/2020 upon evaluation today patient appears to be doing  a little better in regard  to his wound. The dermatologist actually did do some steroid injections into the wound which does seem to have been beneficial in my opinion. That was on the 25th already this looks a little better to me than last time I saw him. With that being said we did do a culture and this did show that he has Staph aureus noted in abundance in the wound. With that being said I do think that getting him on an oral antibiotic would be appropriate as well. Also think we can compression wrap and this will make a difference as well. 12/28/2020 upon evaluation today patient's wound is actually showing signs of doing much better. I do believe the compression wrap is helping he has a lot of drainage but to be honest I think that the compression is helping to some degree in this regard as well as not draining through which is also good news. No fevers, chills, nausea, vomiting, or diarrhea. 01/04/2021 upon evaluation today patient appears to be doing well with regard to his wound. Overall things seem to be doing quite well. He did have a little bit of reaction to the CarboFlex Sorbact he will be using that any longer. With that being said he is controlled as far as the drainage is concerned overall and seems to be doing quite well. I do not see any signs of active infection at this time which is great news. No fevers, chills, nausea, vomiting, or diarrhea. 01/11/2021 upon evaluation today patient appears to be doing well with regard to his wounds. He has been tolerating the dressing changes without complication. Fortunately there does not appear to be any signs of active infection at this time which is great news. Overall I am extremely pleased with where we stand currently. No fevers, chills, nausea, vomiting, or diarrhea. Where using clobetasol in the wound bed he has a lot of new skin growth which is awesome as well. 01/18/2021 upon evaluation today patient appears to be doing very well in regard to his leg ulcer. He has  been tolerating the dressing changes without complication. Fortunately there does not appear to be any signs of active infection which is great news. In general I think that he is making excellent progress 01/25/2021 upon evaluation today patient appears to be doing well with regard to his wound on the leg. I am actually extremely pleased with where things stand today. There does not appear to be any signs of active infection which is great news and overall I think that we are definitely headed in the appropriate direction based on what I am seeing currently. There does not appear to be any signs of active infection also excellent news. 02/06/2021 upon evaluation today patient appears to be doing well with regard to his wound. Overall visually this is showing signs of significant Felkins, Dnaiel ([id number]) improvement which is great news. I do not see any signs of active infection systemically which is great even locally I do not think that we are seeing any major complications here. We did do fluorescence imaging with the [employer] today. The patient does have some odor and drainage noted and again this is something that I think would benefit him to probably come more frequently for nurse visits. 02/19/2021 upon evaluation today patient actually appears to be doing quite well in regard to his wound. He has been tolerating the dressing changes without complication and overall I think that this is making excellent progress. I do  not see any evidence of active infection at this point which is great news as well. No fevers, chills, nausea, vomiting, or diarrhea. 10/10; wound is made nice progress healthy granulation with a nice rim of epithelialization which seems to be expanding even from last week he has a deeper area in the inferior part of the more distal part of the wound with not quite as healthy as surface. This area will need to be followed. Using clobetasol and Hydrofera Blue 03/05/2021 upon  evaluation today patient appears to be doing very well in regard to his leg ulcer. He has been tolerating dressing changes without complication. Fortunately there does not appear to be any signs of active infection which is great news and overall I am extremely pleased with where we stand currently. 03/12/2021 upon evaluation today patient appears to be doing well with regard to his wound in fact this is extremely extremely good based on what we are seeing today there does not appear to be any signs of active infection and overall I think that he is doing awesome from the standpoint of healing in general. I am extremely pleased with how things seem to be progressing with regard to this pyoderma. Clobetasol has done wonders for him. I think the compression wrapping has also been of great benefit. 03/19/2021 upon evaluation today patient appears to be doing well with regard to his wound. He is tolerating the dressing changes without complication. In fact I feel like that he is actually making excellent progress at this point based on what I am seeing. No fevers, chills, nausea, vomiting, or diarrhea. 03/26/2021 upon evaluation today patient appears to be doing well with regard to his wound. This again is measuring smaller and looking better. Again the progress is slow but nonetheless continual with what we have been seeing. I do believe that the current plan is doing awesome for him. 04/09/2021 upon evaluation today patient appears to be doing well with regard to his leg ulcer. This is showing signs of excellent improvement the muscle is completely closed over and there does not appear to be any evidence of inflammation at this point his drainage is significantly improved. Overall I think that he would be a good candidate for looking into a skin substitute at this point as well. We will get a look into some approvals in that regard. Potentially TheraSkin as well as Apligraf could both be considered just  depending on insurance coverage. 04/16/2021 upon evaluation today patient appears to be doing well at this point. He has been approved for the Apligraf which we could definitely order although I would like to try to get the TheraSkin approved if at all possible. I did fax notes into them today and I Georgina Peer try to give a call as well. Overall the wound appears to be doing decently well today. 04/20/2021 upon evaluation today patient actually appears to be doing quite well in regard to his wound with that being said we are trying to see what we can do about speeding up the healing process. For that reason we did discuss the possibility of a skin substitute. We got the Apligraf approved. For that reason we will get a go ahead and see what we can do with the Apligraf at this point. I am still trying to get the TheraSkin approved but I have not heard anything from the insurance company yet I have called and talked to them earlier in the week and to be honest this was about half  an hour that I spent on the phone they told me I would hear something within 10-15 business days. 04/27/2021 upon evaluation today patient appears to be doing excellent in regard to his wound. I do believe the Apligraf has been beneficial. With that being said he has had a little bit of increased pain has been a little bit concerned. I do want to go ahead and apply his steroid cream today the clobetasol and then put the Apligraf over I just do not think I want to risk not putting the clobetasol on based on what is been going on here currently. Patient voiced understanding. 05/04/2021 upon evaluation today patient is making excellent improvement. Overall there is definitely decrease in the size of the wound today and I am very pleased in that regard. I do not see any signs of active infection locally nor systemically at this point. No fevers, chills, nausea, vomiting, or diarrhea. 05/11/2021 upon evaluation today patient appears to be  doing well with regard to his wound. He has been tolerating Apligraf's without complication and is making excellent progress this is application #4 today. 12/30; patient here for Apligraf application. Appears to be doing well small wound there has been a lot of healing 05/28/2021 upon evaluation today patient appears to be doing well with regard to his wound. Has been tolerating the dressing changes without complication. Fortunately I do not see any signs of active infection locally nor systemically at this point which is great news. He is done with the Apligraf and the first round and overall this has filled in quite nicely I am not even certain he needs any additional at the moment. I would actually try to go back to clobetasol with [facility] which previously was doing well for him. 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 He is using clobetasol and Hydrofera Blue. Surface area of the wound is smaller and the surface looks very healthy. 06/11/2021 upon evaluation today patient actually appears to be showing signs of good improvement which is great news. Fortunately I do not see any signs of active infection at this time which is also excellent news. I do believe that the patient is doing well with the clobetasol and Hydrofera Blue. He does have some irritation and itching around the rest of the leg will use some triamcinolone over this area. 06/18/2021 upon evaluation today patient appears to be doing well with regard to his wound. He is showing signs of excellent improvement and overall I am extremely pleased with where we stand today. We are moving in the right direction. 06/25/2021 upon evaluation today patient appears to be doing well with regard to his wound. In fact this is doing also not showing signs of excellent improvement overall very pleased with where we stand today. I do not see any evidence of active infection locally or systemically  at this time which is also great news. 07/02/2021 upon evaluation patient's wound bed actually showed signs of good granulation and epithelization at this point. And this is measuring significantly smaller and overall I am extremely pleased with where we stand today. No fevers, chills, nausea, vomiting, or diarrhea. 07/09/2021 upon evaluation patient's wound bed actually showed signs of good granulation and epithelization at this point. Fortunately there does not appear to be any evidence of active infection locally or systemically which is great news and overall I am extremely pleased with where we IKTAN, AIKMAN ([id number]) stand at this point. 07/16/2021 upon evaluation  today patient appears to be doing well with regard to his wound all things considered although it is maybe a little bit larger than what its been. This does have me a little concerned. Actually question about whether anything is changed and initially he told me know. In fact it was not until the end of the visit that he actually did mention that he had missed his infusion in January. This very well may be what is because the difference is also seeing currently. That definitely has seem to been helping more recently. 07/23/2021 upon evaluation today patient's wound is yet again larger despite the switch to a collagen which was not beneficial. Subsequently I am going to at this point check on a couple things. First and foremost I do want to go ahead and do a culture. I think that there is a chance he could have a low-lying infection that may be part of the issue here. Subsequently I am also probably can go ahead and place him on Bactrim DS which I think is a good option and has done well with them in the past. Again if this is an infection that should hopefully help to turn things around. With all that being said I do believe that the patient should hopefully be able to turn this around with reinitiation of the infusion therapy which will  be going on this week and subsequently getting on the antibiotic. Objective Constitutional Well-nourished and well-hydrated in no acute distress. Vitals Time Taken: 8:08 AM, Height: 71 in, Weight: 338 lbs, BMI: 47.1, Temperature: 98.3 F, Pulse: 81 bpm, Respiratory Rate: 18 breaths/min, Blood Pressure: 136/83 mmHg. Respiratory normal breathing without difficulty. Psychiatric this patient is able to make decisions and demonstrates good insight into disease process. Alert and Oriented x 3. pleasant and cooperative. General Notes: Upon inspection patient's wound bed actually showed signs of good granulation epithelization at this point. Fortunately I do not see any evidence of active infection locally or systemically which is great news and overall this is good nonetheless I am concerned that there could potentially be some infection when it comes to the legs simply due to the fact that this is significantly worse compared to what it was previous. Integumentary (Hair, Skin) Wound #1 status is Open. Original cause of wound was Gradually Appeared. The date acquired was: 11/18/2015. The wound has been in treatment 241 weeks. The wound is located on the Left,Lateral Lower Leg. The wound measures 3.4cm length x 2.5cm width x 0.2cm depth; 6.676cm^2 area and 1.335cm^3 volume. There is Fat Layer (Subcutaneous Tissue) exposed. There is no tunneling or undermining noted. There is a medium amount of serosanguineous drainage noted. The wound margin is flat and intact. There is large (67-100%) red, pink granulation within the wound bed. There is a small (1-33%) amount of necrotic tissue within the wound bed including Adherent Slough. 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 There was a Four Layer Compression Therapy Procedure with a pre-treatment ABI of 1.2 by Levora Dredge, RN. Post procedure Diagnosis Wound #1: Same as Pre-Procedure AEDON, DEASON ([id number]) Plan Follow-up Appointments: Return Appointment in 1 week. Nurse Visit as needed - twice a week Bathing/ Shower/ Hygiene: Clean wound with Normal Saline or wound cleanser.  May shower with wound dressing protected with water repellent cover or cast protector. Edema Control - Lymphedema / Segmental Compressive Device / Other: Optional: One layer of unna paste to top of compression wrap (to act as an anchor). Elevate, Exercise Daily and Avoid Standing for Long Periods of Time. Elevate legs to the level of the heart and pump ankles as often as possible Elevate leg(s) parallel to the floor when sitting. Laboratory ordered were: Wound culture routine - left lower leg wound The following medication(s) was prescribed: Bactrim DS oral 800 mg-160 mg tablet 1 1 tablet oral taken 2 times per day for 14 days starting 07/23/2021 WOUND #1: - Lower Leg Wound Laterality: Left, Lateral Cleanser: Wound Cleanser 3 x Per Week/30 Days Discharge Instructions: Wash your hands with soap and water. Remove old dressing, discard into plastic bag and place into trash. Cleanse the wound with Wound Cleanser prior to applying a clean dressing using gauze sponges, not tissues or cotton balls. Do not scrub or use excessive force. Pat dry using gauze sponges, not tissue or cotton balls. Topical: Clobetasol Propionate ointment 0.05%, 60 (g) tube 3 x Per Week/30 Days Discharge Instructions: Pt to bring to appt- Primary Dressing: Hydrofera Blue Ready Transfer Foam, 4x5 (in/in) 3 x Per Week/30 Days Discharge Instructions: Apply Hydrofera Blue Ready to wound bed as directed Secondary Dressing: (NON-BORDER) Zetuvit Plus  Silicone NON-BORDER 5x5 (in/in) 3 x Per Week/30 Days Compression Wrap: Medichoice 4 layer Compression System, 35-40 mmHG (Generic) 3 x Per Week/30 Days Discharge Instructions: Apply multi-layer wrap as directed. 1. Would recommend currently that we go ahead and initiate treatment with Bactrim DS which I think should be a good option here for him. The patient is in agreement with plan. 2. Also can recommend that we have the patient continue to monitor for any signs of worsening or infection. If anything changes he should let me know right now with the drainage that were seeing I definitely believe that the Bactrim is a good thing to get him started back on. 3. I would also recommend that he continue to elevate his leg and were also going to continue with the compression wrapping. Both of which are doing well. We will also go back to the [facility] which I think is probably good for him the switch to collagen really was not super beneficial and unfortunately he still had a tremendous amount of drainage. Again this does make me concerned about the possibility of infection however as well. We will see patient back for reevaluation in 1 week here in the clinic. If anything worsens or changes patient will contact our office for additional recommendations. Electronic Signature(s) Signed: 07/23/2021 8:44:48 AM By: Worthy Keeler PA-C Entered By: Worthy Keeler on 07/23/2021 08:44:48 DANI, DANIS ([id number]) -------------------------------------------------------------------------------- SuperBill Details Patient Name: [name] Date of Service: 07/23/2021 Medical Record Number: [id number] Patient Account Number: [id number] Date of Birth/Sex: 1980-08-19 (40 y.o. M) Treating RN: Levora Dredge Primary Care Provider: [name] Other Clinician: Referring Provider: [name] Treating Provider/Extender: [name] Cliche in Treatment: 241 Diagnosis Coding ICD-10 Codes Code  Description I87.2 Venous insufficiency (chronic) (peripheral) L97.222 Non-pressure chronic ulcer of left calf with fat layer exposed E11.622 Type 2 diabetes mellitus with other skin ulcer L88 Pyoderma gangrenosum F17.208 Nicotine dependence, unspecified, with other nicotine-induced disorders Facility Procedures CPT4 Code: [id number] Description: (Facility Use Only) [id number] - [facility] Modifier: Quantity: 1 Electronic Signature(s) Unsigned Entered By: Levora Dredge on 07/23/2021 08:44:29 Signature(s): Date(s):

## 2021-07-23 NOTE — Progress Notes (Signed)
LABRADFORD, SCHNITKER ([id number]) Visit Report for 07/23/2021 Arrival Information Details Patient Name: [name] Date of Service: 07/23/2021 8:15 AM Medical Record Number: [id number] Patient Account Number: [id number] Date of Birth/Sex: Oct 07, 1980 (41 y.o. M) Treating RN: Levora Dredge Primary Care Soila Printup: [name] Other Clinician: Referring Dash Cardarelli: [name] Treating Jenny Omdahl/Extender: [name] Cliche in Treatment: 31 Visit Information History Since Last Visit Added or deleted any medications: No Patient Arrived: Ambulatory Any new allergies or adverse reactions: No Arrival Time: 08:05 Had a fall or experienced change in No Accompanied By: self activities of daily living that may affect Transfer Assistance: None risk of falls: Patient Identification Verified: Yes Hospitalized since last visit: No Secondary Verification Process Completed: Yes Has Dressing in Place as Prescribed: Yes Patient Requires Transmission-Based No Has Compression in Place as Prescribed: Yes Precautions: Pain Present Now: No Patient Has Alerts: Yes Patient Alerts: Patient has reaction to silver dressings. Electronic Signature(s) Signed: 07/23/2021 3:20:36 PM By: Levora Dredge Entered By: Levora Dredge on 07/23/2021 08:08:06 [name] ([id number]) -------------------------------------------------------------------------------- Clinic Level of Care Assessment Details Patient Name: [name] Date of Service: 07/23/2021 8:15 AM Medical Record Number: [id number] Patient Account Number: [id number] Date of Birth/Sex: 1981-02-11 (41 y.o. M) Treating RN: Levora Dredge Primary Care Quorra Rosene: [name] Other Clinician: Referring Kerin Kren: [name] Treating Chanan Detwiler/Extender: [name] Cliche in Treatment: 241 Clinic Level of Care Assessment Items TOOL 1 Quantity Score []  - Use when EandM and Procedure is performed on INITIAL visit 0 ASSESSMENTS - Nursing Assessment /  Reassessment []  - General Physical Exam (combine w/ comprehensive assessment (listed just below) when performed on new 0 pt. evals) []  - 0 Comprehensive Assessment (HX, ROS, Risk Assessments, Wounds Hx, etc.) ASSESSMENTS - Wound and Skin Assessment / Reassessment []  - Dermatologic / Skin Assessment (not related to wound area) 0 ASSESSMENTS - Ostomy and/or Continence Assessment and Care []  - Incontinence Assessment and Management 0 []  - 0 Ostomy Care Assessment and Management (repouching, etc.) PROCESS - Coordination of Care []  - Simple Patient / Family Education for ongoing care 0 []  - 0 Complex (extensive) Patient / Family Education for ongoing care []  - 0 Staff obtains Programmer, systems, Records, Test Results / Process Orders []  - 0 Staff telephones HHA, Nursing Homes / Clarify orders / etc []  - 0 Routine Transfer to another Facility (non-emergent condition) []  - 0 Routine Hospital Admission (non-emergent condition) []  - 0 New Admissions / Biomedical engineer / Ordering NPWT, Apligraf, etc. []  - 0 Emergency Hospital Admission (emergent condition) PROCESS - Special Needs []  - Pediatric / Minor Patient Management 0 []  - 0 Isolation Patient Management []  - 0 Hearing / Language / Visual special needs []  - 0 Assessment of Community assistance (transportation, D/C planning, etc.) []  - 0 Additional assistance / Altered mentation []  - 0 Support Surface(s) Assessment (bed, cushion, seat, etc.) INTERVENTIONS - Miscellaneous []  - External ear exam 0 []  - 0 Patient Transfer (multiple staff / Civil Service fast streamer / Similar devices) []  - 0 Simple Staple / Suture removal (25 or less) []  - 0 Complex Staple / Suture removal (26 or more) []  - 0 Hypo/Hyperglycemic Management (do not check if billed separately) []  - 0 Ankle / Brachial Index (ABI) - do not check if billed separately Has the patient been seen at the hospital within the last three years: Yes Total Score: 0 Level Of Care:  ____ [name] ([id number]) Electronic Signature(s) Signed: 07/23/2021 3:20:36 PM By: Levora Dredge Entered By: Levora Dredge on 07/23/2021 08:44:19 [name], [name] ([id number]) --------------------------------------------------------------------------------  Compression Therapy Details Patient Name: [name], [name] Date of Service: 07/23/2021 8:15 AM Medical Record Number: [id number] Patient Account Number: [id number] Date of Birth/Sex: 13-Apr-1981 (41 y.o. M) Treating RN: Levora Dredge Primary Care Kenadie Royce: [name] Other Clinician: Referring Jacquelynn Friend: [name] Treating Anmol Fleck/Extender: [name] Cliche in Treatment: 241 Compression Therapy Performed for Wound Assessment: Wound #1 Left,Lateral Lower Leg Performed By: Clinician Levora Dredge, RN Compression Type: Four Layer Pre Treatment ABI: 1.2 Post Procedure Diagnosis Same as Pre-procedure Electronic Signature(s) Signed: 07/23/2021 3:20:36 PM By: Levora Dredge Entered By: Levora Dredge on 07/23/2021 08:44:09 [name] ([id number]) -------------------------------------------------------------------------------- Encounter Discharge Information Details Patient Name: [name] Date of Service: 07/23/2021 8:15 AM Medical Record Number: [id number] Patient Account Number: [id number] Date of Birth/Sex: 1980-09-27 (40 y.o. M) Treating RN: Levora Dredge Primary Care Leshea Jaggers: [name] Other Clinician: Referring Rilyn Scroggs: [name] Treating Vannak Montenegro/Extender: [name] Cliche in Treatment: [phone] Encounter Discharge Information Items Discharge Condition: Stable Ambulatory Status: Ambulatory Discharge Destination: Home Transportation: Private Auto Accompanied By: self Schedule Follow-up Appointment: Yes Clinical Summary of Care: Electronic Signature(s) Signed: 07/23/2021 3:20:36 PM By: Levora Dredge Entered By: Levora Dredge on 07/23/2021 08:45:23 [name]  ([id number]) -------------------------------------------------------------------------------- Lower Extremity Assessment Details Patient Name: [name] Date of Service: 07/23/2021 8:15 AM Medical Record Number: [id number] Patient Account Number: [id number] Date of Birth/Sex: 1980/08/18 (40 y.o. M) Treating RN: Levora Dredge Primary Care Deavon Podgorski: [name] Other Clinician: Referring Jurney Overacker: [name] Treating Emaleigh Guimond/Extender: [name] Cliche in Treatment: 241 Edema Assessment Assessed: [Left: No] [Right: No] Edema: [Left: Ye] [Right: s] Calf Left: Right: Point of Measurement: 35 cm From Medial Instep 47.5 cm Ankle Left: Right: Point of Measurement: 10 cm From Medial Instep 28.5 cm Electronic Signature(s) Signed: 07/23/2021 3:20:36 PM By: Levora Dredge Entered By: Levora Dredge on 07/23/2021 08:20:15 [name] ([id number]) -------------------------------------------------------------------------------- Multi Wound Chart Details Patient Name: [name] Date of Service: 07/23/2021 8:15 AM Medical Record Number: [id number] Patient Account Number: [id number] Date of Birth/Sex: October 26, 1980 (40 y.o. M) Treating RN: Levora Dredge Primary Care Georges Victorio: [name] Other Clinician: Referring Gianelle Mccaul: [name] Treating Sondos Wolfman/Extender: [name] Cliche in Treatment: 241 Vital Signs Height(in): 71 Pulse(bpm): 81 Weight(lbs): 338 Blood Pressure(mmHg): 136/83 Body Mass Index(BMI): 47.1 Temperature(F): 98.3 Respiratory Rate(breaths/min): 18 Photos: [N/A:N/A] Wound Location: Left, Lateral Lower Leg N/A N/A Wounding Event: Gradually Appeared N/A N/A Primary Etiology: Pyoderma N/A N/A Comorbid History: Sleep Apnea, Hypertension, Colitis N/A N/A Date Acquired: 11/18/2015 N/A N/A Weeks of Treatment: 241 N/A N/A Wound Status: Open N/A N/A Wound Recurrence: No N/A N/A Measurements L x W x D (cm) 3.4x2.5x0.2 N/A N/A Area (cm) : 6.676 N/A  N/A Volume (cm) : 1.335 N/A N/A % Reduction in Area: -36.00% N/A N/A % Reduction in Volume: 66.00% N/A N/A Classification: Full Thickness With Exposed N/A N/A Support Structures Exudate Amount: Medium N/A N/A Exudate Type: Serosanguineous N/A N/A Exudate Color: red, brown N/A N/A Wound Margin: Flat and Intact N/A N/A Granulation Amount: Large (67-100%) N/A N/A Granulation Quality: Red, Pink N/A N/A Necrotic Amount: Small (1-33%) N/A N/A Exposed Structures: Fat Layer (Subcutaneous Tissue): N/A N/A Yes Fascia: No Tendon: No Muscle: No Joint: No Bone: No Epithelialization: Medium (34-66%) N/A N/A Treatment Notes Electronic Signature(s) Signed: 07/23/2021 3:20:36 PM By: Levora Dredge Entered By: Levora Dredge on 07/23/2021 08:23:12 [name] ([id number]) -------------------------------------------------------------------------------- Multi-Disciplinary Care Plan Details Patient Name: [name] Date of Service: 07/23/2021 8:15 AM Medical Record Number: [id number] Patient Account Number: [id number] Date of Birth/Sex: [birth year] (40 y.o. M) Treating RN: Levora Dredge  Primary Care Amariz Flamenco: [name] Other Clinician: Referring Gracelynn Bircher: [name] Treating Jolyn Deshmukh/Extender: [name] Cliche in Treatment: [place] reviewed with physician Active Inactive Necrotic Tissue Nursing Diagnoses: Impaired tissue integrity related to necrotic/devitalized tissue Knowledge deficit related to management of necrotic/devitalized tissue Goals: Necrotic/devitalized tissue will be minimized in the wound bed Date Initiated: 03/26/2021 Target Resolution Date: 05/26/2021 Goal Status: Active Patient/caregiver will verbalize understanding of reason and process for debridement of necrotic tissue Date Initiated: 03/26/2021 Date Inactivated: 04/27/2021 Target Resolution Date: 03/26/2021 Goal Status: Met Interventions: Assess patient pain level pre-,  during and post procedure and prior to discharge Provide education on necrotic tissue and debridement process Treatment Activities: Apply topical anesthetic as ordered : 03/26/2021 Notes: Electronic Signature(s) Signed: 07/23/2021 3:20:36 PM By: Levora Dredge Entered By: Levora Dredge on 07/23/2021 08:22:59 [name] ([id number]) -------------------------------------------------------------------------------- Pain Assessment Details Patient Name: [name] Date of Service: 07/23/2021 8:15 AM Medical Record Number: [id number] Patient Account Number: [id number] Date of Birth/Sex: 02-09-1981 (40 y.o. M) Treating RN: Levora Dredge Primary Care Caera Enwright: [name] Other Clinician: Referring Prayan Ulin: [name] Treating Ann-Marie Kluge/Extender: [name] Cliche in Treatment: [phone] Active Problems Location of Pain Severity and Description of Pain Patient Has Paino No Site Locations Rate the pain. Current Pain Level: 0 Pain Management and Medication Current Pain Management: Electronic Signature(s) Signed: 07/23/2021 3:20:36 PM By: Levora Dredge Entered By: Levora Dredge on 07/23/2021 08:09:48 [name] ([id number]) -------------------------------------------------------------------------------- Patient/Caregiver Education Details Patient Name: [name] Date of Service: 07/23/2021 8:15 AM Medical Record Number: [id number] Patient Account Number: [id number] Date of Birth/Gender: 10-25-1980 (40 y.o. M) Treating RN: Levora Dredge Primary Care Physician: [name] Other Clinician: Referring Physician: [name] Treating Physician/Extender: [name] Cliche in Treatment: [phone] Education Assessment Education Provided To: Patient Education Topics Provided Wound/Skin Impairment: Handouts: Caring for Your Ulcer Methods: Explain/Verbal Responses: State content correctly Electronic Signature(s) Signed: 07/23/2021 3:20:36 PM By: Levora Dredge Entered  By: Levora Dredge on 07/23/2021 08:44:42 [name] ([id number]) -------------------------------------------------------------------------------- Wound Assessment Details Patient Name: [name] Date of Service: 07/23/2021 8:15 AM Medical Record Number: [id number] Patient Account Number: [id number] Date of Birth/Sex: 1980/09/21 (40 y.o. M) Treating RN: Levora Dredge Primary Care Tami Blass: [name] Other Clinician: Referring Marshayla Mitschke: [name] Treating Lonie Newsham/Extender: [name] Cliche in Treatment: 241 Wound Status Wound Number: 1 Primary Etiology: Pyoderma Wound Location: Left, Lateral Lower Leg Wound Status: Open Wounding Event: Gradually Appeared Comorbid History: Sleep Apnea, Hypertension, Colitis Date Acquired: 11/18/2015 Weeks Of Treatment: 241 Clustered Wound: No Photos Wound Measurements Length: (cm) 3.4 Width: (cm) 2.5 Depth: (cm) 0.2 Area: (cm) 6.676 Volume: (cm) 1.335 % Reduction in Area: -36% % Reduction in Volume: 66% Epithelialization: Medium (34-66%) Tunneling: No Undermining: No Wound Description Classification: Full Thickness With Exposed Support Structures Wound Margin: Flat and Intact Exudate Amount: Medium Exudate Type: Serosanguineous Exudate Color: red, brown Foul Odor After Cleansing: No Slough/Fibrino Yes Wound Bed Granulation Amount: Large (67-100%) Exposed Structure Granulation Quality: Red, Pink Fascia Exposed: No Necrotic Amount: Small (1-33%) Fat Layer (Subcutaneous Tissue) Exposed: Yes Necrotic Quality: Adherent Slough Tendon Exposed: No Muscle Exposed: No Joint Exposed: No Bone Exposed: No Treatment Notes Wound #1 (Lower Leg) Wound Laterality: Left, Lateral Cleanser Wound Cleanser Discharge Instruction: Wash your hands with soap and water. Remove old dressing, discard into plastic bag and place into trash. Cleanse the wound with Wound Cleanser prior to applying a clean dressing using gauze sponges, not  tissues or cotton balls. Do not scrub or use excessive force. Pat dry using  gauze sponges, not tissue or cotton balls. [name], [name] ([id number]) Peri-Wound Care Topical Clobetasol Propionate ointment 0.05%, 60 (g) tube Discharge Instruction: Pt to bring to appt- Primary Dressing Hydrofera Blue Ready Transfer Foam, 4x5 (in/in) Discharge Instruction: Apply Hydrofera Blue Ready to wound bed as directed Secondary Dressing (NON-BORDER) Zetuvit Plus Silicone NON-BORDER 5x5 (in/in) Secured With Compression Wrap Medichoice 4 layer Compression System, 35-40 mmHG Discharge Instruction: Apply multi-layer wrap as directed. Compression Stockings Add-Ons Electronic Signature(s) Signed: 07/23/2021 3:20:36 PM By: Levora Dredge Entered By: Levora Dredge on 07/23/2021 08:19:13 TAHJI, [place] ([id number]) -------------------------------------------------------------------------------- Vitals Details Patient Name: [name] Date of Service: 07/23/2021 8:15 AM Medical Record Number: [id number] Patient Account Number: [id number] Date of Birth/Sex: [birth year] (40 y.o. M) Treating RN: Levora Dredge Primary Care Arrielle Mcginn: [name] Other Clinician: Referring Tysen Roesler: [name] Treating Vienna Folden/Extender: [name] Cliche in Treatment: 241 Vital Signs Time Taken: 08:08 Temperature (F): 98.3 Height (in): 71 Pulse (bpm): 81 Weight (lbs): 338 Respiratory Rate (breaths/min): 18 Body Mass Index (BMI): 47.1 Blood Pressure (mmHg): 136/83 Reference Range: 80 - 120 mg / dl Electronic Signature(s) Signed: 07/23/2021 3:20:36 PM By: Levora Dredge Entered By: Levora Dredge on 07/23/2021 08:09:38

## 2021-07-25 ENCOUNTER — Other Ambulatory Visit: Payer: Self-pay

## 2021-07-25 DIAGNOSIS — I872 Venous insufficiency (chronic) (peripheral): Secondary | ICD-10-CM | POA: Diagnosis not present

## 2021-07-25 NOTE — Progress Notes (Signed)
RODARIUS, KICHLINE ([id number]) ?Visit Report for 07/25/2021 ?Arrival Information Details ?Patient Name: [name], [name] ?Date of Service: 07/25/2021 8:00 AM ?Medical Record Number: [id number] ?Patient Account Number: [id number] ?Date of Birth/Sex: Dec 16, 1980 (41 y.o. M) ?Treating RN: Donnamarie Poag ?Primary Care Brecklyn Galvis: [name] Other Clinician: ?Referring Senaida Chilcote: [name] ?Treating Sarinah Doetsch/Extender: Kalman Shan ?Weeks in Treatment: 242 ?Visit Information History Since Last Visit ?Added or deleted any medications: No ?Patient Arrived: Ambulatory ?Had a fall or experienced change in No ?Arrival Time: 08:15 ?activities of daily living that may affect ?Accompanied By: self ?risk of falls: ?Transfer Assistance: None ?Hospitalized since last visit: No ?Patient Identification Verified: Yes ?Has Dressing in Place as Prescribed: Yes ?Secondary Verification Process Completed: Yes ?Has Compression in Place as Prescribed: Yes ?Patient Requires Transmission-Based No ?Pain Present Now: No ?Precautions: ?Patient Has Alerts: Yes ?Patient Alerts: Patient has reaction ?to ?silver dressings. ?Electronic Signature(s) ?Signed: 07/25/2021 12:55:43 PM By: Donnamarie Poag ?Entered ByDonnamarie Poag on 07/25/2021 08:16:11 ?[name], [name] ([id number]) ?-------------------------------------------------------------------------------- ?Compression Therapy Details ?Patient Name: [name], [name] ?Date of Service: 07/25/2021 8:00 AM ?Medical Record Number: [id number] ?Patient Account Number: [id number] ?Date of Birth/Sex: 12/16/1980 (41 y.o. M) ?Treating RN: Donnamarie Poag ?Primary Care Kiona Blume: [name] Other Clinician: ?Referring Lariyah Shetterly: [name] ?Treating Shalom Mcguiness/Extender: Kalman Shan ?Weeks in Treatment: 242 ?Compression Therapy Performed for Wound Assessment: Wound #1 Left,Lateral Lower Leg ?Performed By: Clinician Donnamarie Poag, RN ?Compression Type: Four Layer ?Electronic Signature(s) ?Signed: 07/25/2021 12:55:43 PM By: Donnamarie Poag ?Entered ByDonnamarie Poag on 07/25/2021 08:27:05 ?[name], [name] ([id number]) ?-------------------------------------------------------------------------------- ?Encounter Discharge Information Details ?Patient Name: [name], [name] ?Date of Service: 07/25/2021 8:00 AM ?Medical Record Number: [id number] ?Patient Account Number: [id number] ?Date of Birth/Sex: July 10, 1980 (41 y.o. M) ?Treating RN: Donnamarie Poag ?Primary Care Datron Brakebill: [name] Other Clinician: ?Referring Veleta Yamamoto: [name] ?Treating Asiyah Pineau/Extender: Kalman Shan ?Weeks in Treatment: 242 ?Encounter Discharge Information Items ?Discharge Condition: Stable ?Ambulatory Status: Ambulatory ?Discharge Destination: Home ?Transportation: Private Auto ?Accompanied By: self ?Schedule Follow-up Appointment: Yes ?Clinical Summary of Care: ?Electronic Signature(s) ?Signed: 07/25/2021 12:55:43 PM By: Donnamarie Poag ?Entered ByDonnamarie Poag on 07/25/2021 08:29:21 ?[name], [name] ([id number]) ?-------------------------------------------------------------------------------- ?Wound Assessment Details ?Patient Name: [name], [name] ?Date of Service: 07/25/2021 8:00 AM ?Medical Record Number: [id number] ?Patient Account Number: [id number] ?Date of Birth/Sex: 1980-09-29 (41 y.o. M) ?Treating RN: Donnamarie Poag ?Primary Care Siya Flurry: [name] Other Clinician: ?Referring Cuahutemoc Attar: [name] ?Treating Jacquiline Zurcher/Extender: Kalman Shan ?Weeks in Treatment: 242 ?Wound Status ?Wound Number: 1 ?Primary Etiology: Pyoderma ?Wound Location: Left, Lateral Lower Leg ?Wound Status: Open ?Wounding Event: Gradually Appeared ?Comorbid History: Sleep Apnea, Hypertension, Colitis ?Date Acquired: 11/18/2015 ?Weeks Of Treatment: 242 ?Clustered Wound: No ?Photos ?Wound Measurements ?Length: (cm) 3.4 ?Width: (cm) 2.5 ?Depth: (cm) 0.2 ?Area: (cm?) 6.676 ?Volume: (cm?) 1.335 ?% Reduction in Area: -36% ?% Reduction in Volume: 66% ?Epithelialization: Medium (34-66%) ?Wound  Description ?Classification: Full Thickness With Exposed Support Structures ?Wound Margin: Flat and Intact ?Exudate Amount: Medium ?Exudate Type: Serosanguineous ?Exudate Color: red, brown ?Foul Odor After Cleansing: No ?Slough/Fibrino Yes ?Wound Bed ?Granulation Amount: Large (67-100%) Exposed Structure ?Granulation Quality: Red, Pink ?Fascia Exposed: No ?Necrotic Amount: Small (1-33%) ?Fat Layer (Subcutaneous Tissue) Exposed: Yes ?Necrotic Quality: Adherent Slough ?Tendon Exposed: No ?Muscle Exposed: No ?Joint Exposed: No ?Bone Exposed: No ?Treatment Notes ?Wound #1 (Lower Leg) Wound Laterality: Left, Lateral ?Cleanser ?Wound Cleanser ?Discharge Instruction: Wash your hands with soap and water. Remove old dressing, discard into plastic bag and place into trash. ?Cleanse the wound with Wound Cleanser prior to applying a clean dressing using gauze  sponges, not tissues or cotton balls. Do ?not scrub or use excessive force. Pat dry using gauze sponges, not tissue or cotton balls. ?[name], [name] ([id number]) ?Peri-Wound Care ?Topical ?Clobetasol Propionate ointment 0.05%, 60 (g) tube ?Discharge Instruction: Pt to bring to appt- ?Primary Dressing ?Hydrofera Blue Ready Transfer Foam, 4x5 (in/in) ?Discharge Instruction: Apply Hydrofera Blue Ready to wound bed as directed ?Secondary Dressing ?(NON-BORDER) Zetuvit Plus Silicone NON-BORDER 5x5 (in/in) ?Secured With ?Compression Wrap ?Medichoice 4 layer Compression System, 35-40 mmHG ?Discharge Instruction: Apply multi-layer wrap as directed. ?Compression Stockings ?Add-Ons ?Electronic Signature(s) ?Signed: 07/25/2021 12:55:43 PM By: Donnamarie Poag ?Entered ByDonnamarie Poag on 07/25/2021 08:17:07 ?

## 2021-07-25 NOTE — Progress Notes (Signed)
[name], [name] ([id number]) ?Visit Report for 07/25/2021 ?Physician Orders Details ?Patient Name: [name], [name] ?Date of Service: 07/25/2021 8:00 AM ?Medical Record Number: [id number] ?Patient Account Number: [id number] ?Date of Birth/Sex: November 19, 1980 (41 y.o. M) ?Treating RN: Donnamarie Poag ?Primary Care Provider: [name] Other Clinician: ?Referring Provider: [name] ?Treating Provider/Extender: Kalman Shan ?Weeks in Treatment: 242 ?Verbal / Phone Orders: No ?Diagnosis Coding ?Follow-up Appointments ?o Return Appointment in 1 week. ?o Nurse Visit as needed - twice a week ?Bathing/ Shower/ Hygiene ?o Clean wound with Normal Saline or wound cleanser. ?o May shower with wound dressing protected with water repellent cover or cast protector. ?Edema Control - Lymphedema / Segmental Compressive Device / Other ?o Optional: One layer of unna paste to top of compression wrap (to act as an anchor). ?o Elevate, Exercise Daily and Avoid Standing for Long Periods of Time. ?o Elevate legs to the level of the heart and pump ankles as often as possible ?o Elevate leg(s) parallel to the floor when sitting. ?Wound Treatment ?Wound #1 - Lower Leg Wound Laterality: Left, Lateral ?Cleanser: Wound Cleanser 3 x Per Week/30 Days ?Discharge Instructions: Wash your hands with soap and water. Remove old dressing, discard into plastic bag and place into trash. ?Cleanse the wound with Wound Cleanser prior to applying a clean dressing using gauze sponges, not tissues or cotton balls. Do not ?scrub or use excessive force. Pat dry using gauze sponges, not tissue or cotton balls. ?Topical: Clobetasol Propionate ointment 0.05%, 60 (g) tube 3 x Per Week/30 Days ?Discharge Instructions: Pt to bring to appt- ?Primary Dressing: Hydrofera Blue Ready Transfer Foam, 4x5 (in/in) 3 x Per Week/30 Days ?Discharge Instructions: Apply Hydrofera Blue Ready to wound bed as directed ?Secondary Dressing: (NON-BORDER) Zetuvit Plus Silicone  NON-BORDER 5x5 (in/in) 3 x Per Week/30 Days ?Compression Wrap: Medichoice 4 layer Compression System, 35-40 mmHG (Generic) 3 x Per Week/30 Days ?Discharge Instructions: Apply multi-layer wrap as directed. ?Electronic Signature(s) ?Signed: 07/25/2021 11:05:51 AM By: Kalman Shan DO ?Signed: 07/25/2021 12:55:43 PM By: Donnamarie Poag ?Entered ByDonnamarie Poag on 07/25/2021 08:27:39 ?KOA, ZOELLER ([id number]) ?-------------------------------------------------------------------------------- ?SuperBill Details ?Patient Name: [name], [name] ?Date of Service: 07/25/2021 ?Medical Record Number: [id number] ?Patient Account Number: [id number] ?Date of Birth/Sex: 21-Feb-1981 (41 y.o. 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

## 2021-07-26 LAB — AEROBIC CULTURE W GRAM STAIN (SUPERFICIAL SPECIMEN)

## 2021-07-27 ENCOUNTER — Other Ambulatory Visit: Payer: Self-pay

## 2021-07-27 DIAGNOSIS — I872 Venous insufficiency (chronic) (peripheral): Secondary | ICD-10-CM | POA: Diagnosis not present

## 2021-07-27 NOTE — Unmapped (Signed)
[facility] Update note for your review

## 2021-07-27 NOTE — Progress Notes (Signed)
[name] ([id number]) ?Visit Report for 07/27/2021 ?Arrival Information Details ?Patient Name: [name], [name] ?Date of Service: 07/27/2021 8:00 AM ?Medical Record Number: [id number] ?Patient Account Number: [id number] ?Date of Birth/Sex: Dec 28, 1980 (41 y.o. M) ?Treating RN: Carlene Coria ?Primary Care Curtis Uriarte: [name] Other Clinician: ?Referring Jennessy Sandridge: [name] ?Treating Breyona Swander/Extender: Ricard Dillon ?Weeks in Treatment: 242 ?Visit Information History Since Last Visit ?All ordered tests and consults were completed: No ?Patient Arrived: Ambulatory ?Added or deleted any medications: No ?Arrival Time: 08:00 ?Any new allergies or adverse reactions: No ?Accompanied By: self ?Had a fall or experienced change in No ?Transfer Assistance: None ?activities of daily living that may affect ?Patient Identification Verified: Yes ?risk of falls: ?Secondary Verification Process Completed: Yes ?Signs or symptoms of abuse/neglect since last visito No ?Patient Requires Transmission-Based No ?Hospitalized since last visit: No ?Precautions: ?Implantable device outside of the clinic excluding No ?Patient Has Alerts: Yes ?cellular tissue based products placed in the center ?Patient Alerts: Patient has reaction ?since last visit: ?to ?Has Dressing in Place as Prescribed: Yes ?silver dressings. ?Pain Present Now: No ?Electronic Signature(s) ?Signed: 07/27/2021 8:26:24 AM By: Carlene Coria RN ?Entered ByCarlene Coria on 07/27/2021 80:99:83 ?KEOKI, MCHARGUE ([id number]) ?-------------------------------------------------------------------------------- ?Compression Therapy Details ?Patient Name: [name] ?Date of Service: 07/27/2021 8:00 AM ?Medical Record Number: [id number] ?Patient Account Number: [id number] ?Date of Birth/Sex: [birth year] (41 y.o. M) ?Treating RN: Carlene Coria ?Primary Care Maryalyce Sanjuan: [name] Other Clinician: ?Referring Bryann Mcnealy: [name] ?Treating Demonica Farrey/Extender: Ricard Dillon ?Weeks in Treatment: 242 ?Compression Therapy Performed for Wound Assessment: Wound #1 Left,Lateral Lower Leg ?Performed By: Clinician Carlene Coria, RN ?Compression Type: Four Layer ?Electronic Signature(s) ?Signed: 07/27/2021 8:27:17 AM By: Carlene Coria RN ?Entered By: Carlene Coria on 07/27/2021 08:27:17 ?AODHAN, SCHEIDT ([id number]) ?-------------------------------------------------------------------------------- ?Encounter Discharge Information Details ?Patient Name: [name] ?Date of Service: 07/27/2021 8:00 AM ?Medical Record Number: [id number] ?Patient Account Number: [id number] ?Date of Birth/Sex: 27-May-1980 (41 y.o. M) ?Treating RN: Carlene Coria ?Primary Care Tymar Polyak: [name] Other Clinician: ?Referring Latravia Southgate: [name] ?Treating Jalene Demo/Extender: Ricard Dillon ?Weeks in Treatment: 242 ?Encounter Discharge Information Items ?Discharge Condition: Stable ?Ambulatory Status: Ambulatory ?Discharge Destination: Home ?Transportation: Private Auto ?Accompanied By: self ?Schedule Follow-up Appointment: Yes ?Clinical Summary of Care: Patient Declined ?Electronic Signature(s) ?Signed: 07/27/2021 8:28:11 AM By: Carlene Coria RN ?Entered By: Carlene Coria on 07/27/2021 08:28:11 ?KWANE, ROHL ([id number]) ?-------------------------------------------------------------------------------- ?Wound Assessment Details ?Patient Name: [name] ?Date of Service: 07/27/2021 8:00 AM ?Medical Record Number: [id number] ?Patient Account Number: [id number] ?Date of Birth/Sex: 1980-06-15 (41 y.o. M) ?Treating RN: Carlene Coria ?Primary Care Kirat Mezquita: [name] Other Clinician: ?Referring Latiana Tomei: [name] ?Treating Kaja Jackowski/Extender: Ricard Dillon ?Weeks in Treatment: 242 ?Wound Status ?Wound Number: 1 ?Primary Etiology: Pyoderma ?Wound Location: Left, Lateral Lower Leg ?Wound Status: Open ?Wounding Event: Gradually Appeared ?Comorbid History: Sleep Apnea, Hypertension, Colitis ?Date Acquired:  11/18/2015 ?Weeks Of Treatment: 242 ?Clustered Wound: No ?Wound Measurements ?Length: (cm) 3.4 ?Width: (cm) 2.5 ?Depth: (cm) 0.2 ?Area: (cm?) 6.676 ?Volume: (cm?) 1.335 ?% Reduction in Area: -36% ?% Reduction in Volume: 66% ?Epithelialization: Medium (34-66%) ?Tunneling: No ?Undermining: No ?Wound Description ?Classification: Full Thickness With Exposed Support Structures ?Wound Margin: Flat and Intact ?Exudate Amount: Medium ?Exudate Type: Serosanguineous ?Exudate Color: red, brown ?Foul Odor After Cleansing: No ?Slough/Fibrino Yes ?Wound Bed ?Granulation Amount: Large (67-100%) Exposed Structure ?Granulation Quality: Red, Pink ?Fascia Exposed: No ?Necrotic Amount: Small (1-33%) ?Fat Layer (Subcutaneous Tissue) Exposed: Yes ?Necrotic Quality: Adherent Slough ?Tendon Exposed: No ?Muscle Exposed: No ?Joint Exposed: No ?Bone Exposed:  No ?Treatment Notes ?Wound #1 (Lower Leg) Wound Laterality: Left, Lateral ?Cleanser ?Wound Cleanser ?Discharge Instruction: Wash your hands with soap and water. Remove old dressing, discard into plastic bag and place into trash. ?Cleanse the wound with Wound Cleanser prior to applying a clean dressing using gauze sponges, not tissues or cotton balls. Do ?not scrub or use excessive force. Pat dry using gauze sponges, not tissue or cotton balls. ?Peri-Wound Care ?Topical ?Clobetasol Propionate ointment 0.05%, 60 (g) tube ?Discharge Instruction: Pt to bring to appt- ?Primary Dressing ?Hydrofera Blue Ready Transfer Foam, 4x5 (in/in) ?Discharge Instruction: Apply Hydrofera Blue Ready to wound bed as directed ?Secondary Dressing ?(NON-BORDER) Zetuvit Plus Silicone NON-BORDER 5x5 (in/in) ?Secured With ?JEZIEL, HOFFMANN ([id number]) ?Compression Wrap ?Medichoice 4 layer Compression System, 35-40 mmHG ?Discharge Instruction: Apply multi-layer wrap as directed. ?Compression Stockings ?Add-Ons ?Electronic Signature(s) ?Signed: 07/27/2021 8:26:53 AM By: Carlene Coria RN ?Entered ByCarlene Coria on  07/27/2021 08:26:52 ?

## 2021-07-27 NOTE — Progress Notes (Signed)
SHERWIN, HOLLINGSHED ([id number]) ?Visit Report for 07/27/2021 ?Physician Orders Details ?Patient Name: [name], [name] ?Date of Service: 07/27/2021 8:00 AM ?Medical Record Number: [id number] ?Patient Account Number: [id number] ?Date of Birth/Sex: May 01, 1981 (41 y.o. M) ?Treating RN: Carlene Coria ?Primary Care Provider: [name] Other Clinician: ?Referring Provider: [name] ?Treating Provider/Extender: Ricard Dillon ?Weeks in Treatment: 242 ?Verbal / Phone Orders: No ?Diagnosis Coding ?Follow-up Appointments ?o Return Appointment in 1 week. ?o Nurse Visit as needed - twice a week ?Bathing/ Shower/ Hygiene ?o Clean wound with Normal Saline or wound cleanser. ?o May shower with wound dressing protected with water repellent cover or cast protector. ?Edema Control - Lymphedema / Segmental Compressive Device / Other ?o Optional: One layer of unna paste to top of compression wrap (to act as an anchor). ?o Elevate, Exercise Daily and Avoid Standing for Long Periods of Time. ?o Elevate legs to the level of the heart and pump ankles as often as possible ?o Elevate leg(s) parallel to the floor when sitting. ?Wound Treatment ?Wound #1 - Lower Leg Wound Laterality: Left, Lateral ?Cleanser: Wound Cleanser 3 x Per Week/30 Days ?Discharge Instructions: Wash your hands with soap and water. Remove old dressing, discard into plastic bag and place into trash. ?Cleanse the wound with Wound Cleanser prior to applying a clean dressing using gauze sponges, not tissues or cotton balls. Do not ?scrub or use excessive force. Pat dry using gauze sponges, not tissue or cotton balls. ?Topical: Clobetasol Propionate ointment 0.05%, 60 (g) tube 3 x Per Week/30 Days ?Discharge Instructions: Pt to bring to appt- ?Primary Dressing: Hydrofera Blue Ready Transfer Foam, 4x5 (in/in) 3 x Per Week/30 Days ?Discharge Instructions: Apply Hydrofera Blue Ready to wound bed as directed ?Secondary Dressing: (NON-BORDER) Zetuvit Plus  Silicone NON-BORDER 5x5 (in/in) 3 x Per Week/30 Days ?Compression Wrap: Medichoice 4 layer Compression System, 35-40 mmHG (Generic) 3 x Per Week/30 Days ?Discharge Instructions: Apply multi-layer wrap as directed. ?Electronic Signature(s) ?Signed: 07/27/2021 8:27:42 AM By: Carlene Coria RN ?Signed: 07/27/2021 1:04:01 PM By: Linton Ham MD ?Entered By: Carlene Coria on 07/27/2021 08:27:41 ?[name], [name] ([id number]) ?-------------------------------------------------------------------------------- ?SuperBill Details ?Patient Name: [name], [name] ?Date of Service: 07/27/2021 ?Medical Record Number: [id number] ?Patient Account Number: [id number] ?Date of Birth/Sex: [birth year] (41 y.o. 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

## 2021-07-30 ENCOUNTER — Encounter (HOSPITAL_BASED_OUTPATIENT_CLINIC_OR_DEPARTMENT_OTHER): Payer: 59 | Admitting: Internal Medicine

## 2021-07-30 ENCOUNTER — Other Ambulatory Visit: Payer: Self-pay

## 2021-07-30 DIAGNOSIS — L88 Pyoderma gangrenosum: Secondary | ICD-10-CM

## 2021-07-30 DIAGNOSIS — E11622 Type 2 diabetes mellitus with other skin ulcer: Secondary | ICD-10-CM

## 2021-07-30 DIAGNOSIS — L97222 Non-pressure chronic ulcer of left calf with fat layer exposed: Secondary | ICD-10-CM | POA: Diagnosis not present

## 2021-07-30 DIAGNOSIS — I872 Venous insufficiency (chronic) (peripheral): Secondary | ICD-10-CM

## 2021-07-30 NOTE — Progress Notes (Signed)
MARGARITO, DEHAAS ([id number]) Visit Report for 07/30/2021 Chief Complaint Document Details Patient Name: KURTISS, WENCE Date of Service: 07/30/2021 8:00 AM Medical Record Number: [id number] Patient Account Number: [id number] Date of Birth/Sex: 1980-12-02 (41 y.o. 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 M) Treating RN: Carlene Coria Primary Care Provider: [name] Other Clinician: Referring Provider: [name] Treating Provider/Extender: Yaakov Guthrie in Treatment: 34 History of Present Illness HPI Description: 12/04/16; 41 year old man who comes into the clinic today for review of a wound on the posterior left calf. He tells me that is been there for about a year. He is not a diabetic he does smoke half a pack per day. He was seen in the ER on 11/20/16 felt to have cellulitis around the wound and was given clindamycin. An x-ray did not show osteomyelitis. The patient initially tells me that he has a milk allergy that sets off a pruritic itching rash on his lower legs which she scratches incessantly and he thinks that's what may have set up the wound. He has been using various topical antibiotics and ointments without any effect. He works in a [employer] and is on his feet all day. He does not have a  prior history of wounds however he does have the rash on both lower legs the right arm and the ventral aspect of his left arm. These are excoriations and clearly have had scratching however there are of macular looking areas on both legs including a substantial larger area on the right leg. This does not have an underlying open area. There is no blistering. The patient tells me that 2 years ago in [place] in response to the rash on his legs he saw a dermatologist who told him he had a condition which may be pyoderma gangrenosum although I may be putting words into his mouth. He seemed to recognize this. On further questioning he admits to a 5 year history of quiesced. ulcerative colitis. He is not in any treatment for this. He's had no recent travel 12/11/16; the patient arrives today with his wound and roughly the same condition we've been using silver alginate this is a deep punched out wound with some surrounding erythema but no tenderness. Biopsy I did did not show confirmed pyoderma gangrenosum suggested nonspecific inflammation and vasculitis but does not provide an actual description of what was seen by the pathologist. I'm really not able to understand this We have also received information from the patient's dermatologist in [place] notes from April 2016. This was a doctor Agarwal-antal. 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 He was prescribed topical steroid high potency under occlusion which helped but at this point the patient did not have a deep punched out wound. 12/18/16; the patient's wound is larger in terms of surface area however this surface looks better and there is less depth. The surrounding erythema also is better. The patient states that the wrap we put on came off 2 days ago when he has been using his compression stockings. He we  are in the process of getting a dermatology consult. 12/26/16 on evaluation today patient's left lower extremity wound shows evidence of  infection with surrounding erythema noted. He has been tolerating the dressing changes but states that he has noted more discomfort. There is a larger area of erythema surrounding the wound. No fevers, chills, nausea, or vomiting noted at this time. With that being said the wound still does have slough covering the surface. He is not allergic to any medication that he is aware of at this point. In regard to his right lower extremity he had several regions that are erythematous and pruritic he wonders if there's anything we can do to help that. 01/02/17 I reviewed patient's wound culture which was obtained his visit last week. He was placed on doxycycline at that point. Unfortunately that does not appear to be an antibiotic that would likely help with the situation however the pseudomonas noted on culture is sensitive to Cipro. Also unfortunately patient's wound seems to have a large compared to last week's evaluation. Not severely so but there are definitely increased measurements in general. He is continuing to have discomfort as well he writes this to be a seven out of 10. In fact he would prefer me not to perform any debridement today due to the fact that he is having discomfort and considering he has an active infection on the little reluctant to do so anyway. No fevers, chills, nausea, or vomiting noted at this time. 01/08/17; patient seems dermatology on September 5. I suspect dermatology will want the slides from the biopsy I did sent to their pathologist. I'm not sure if there is a way we can expedite that. In any case the culture I did before I left on vacation 3 weeks ago showed Pseudomonas he was given 10 days of Cipro and per her description of her intake nurses is actually somewhat better this week although the wound is quite a bit bigger than I remember the last time I saw this. He still has 3 more days of Cipro 01/21/17; dermatology appointment tomorrow. He has completed the ciprofloxacin  for Pseudomonas. Surface of the wound looks better however he is had some deterioration in the lesions on his right leg. Meantime the left lateral leg wound we will continue with sample 01/29/17; patient had his dermatology appointment but I can't yet see that note. He is completed his antibiotics. The wound is more superficial but considerably larger in circumferential area than when he came in. This is in his left lateral calf. He also has swollen erythematous areas with superficial wounds on the right leg and small papular areas on both arms. There apparently areas in her his upper thighs and buttocks I did not look at those. Dermatology biopsied the right leg. Hopefully will have their input next week. 02/05/17; patient went back to see his dermatologist who told him that he had a "scratching problem" as well as staph. He is now on a 30 day course of doxycycline and I believe she gave him triamcinolone cream to the right leg areas to help with the itching [not exactly sure but probably triamcinolone]. She apparently looked at the left lateral leg wound although this was not rebiopsied and I think felt to be ultimately part of the same pathogenesis. He is using sample border foam and changing nevus himself. He now has a new open area on the right posterior leg which was his biopsy site I don't have any of the dermatology notes 02/12/17; we  put the patient in compression last week with SANTYL to the wound on the left leg and the biopsy. Edema is much better and the depth of the wound is now at level of skin. Area is still the same oBiopsy site on the right lateral leg we've also been using santyl with a border foam dressing and he is changing this himself. 02/19/17; Using silver alginate started last week to both the substantial left leg wound and the biopsy site on the right wound. He is tolerating compression well. Has a an appointment with his primary M.D. tomorrow wondering about diuretics although  I'm wondering if the edema problem is actually lymphedema 02/26/17; the patient has been to see his primary doctor Dr. Jerrel Ivory at [place] our primary care. She started him on Lasix 20 mg and this seems to have helped with the edema. However we are not making substantial change with the left lateral calf wound and inflammation. The biopsy site on the right leg also looks stable but not really all that different. 03/12/17; the patient has been to see vein and vascular Dr. Lucky Cowboy. He has had venous reflux studies I have not reviewed these. I did get a call from his dermatology office. They felt that he might have pathergy based on their biopsy on his right leg which led them to look at the slides of SHIRL, LUDINGTON ([id number]) the biopsy I did on the left leg and they wonder whether this represents pyoderma gangrenosum which was the original supposition in a man with ulcerative colitis albeit inactive for many years. They therefore recommended clobetasol and tetracycline i.e. aggressive treatment for possible pyoderma gangrenosum. 03/26/17; apparently the patient just had reflux studies not an appointment with Dr. dew. She arrives in clinic today having applied clobetasol for 2-3 weeks. He notes over the last 2-3 days excessive drainage having to change the dressing 3-4 times a day and also expanding erythema. 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 Lillia Carmel at [facility] dermatology locally who kindly saw him at my request. 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 My concern was the degree of erythema medially extending from his left leg wound which was either inflammation from pyoderma  or cellulitis. I put him on Augmentin however culture of the wound showed Pseudomonas which is quinolone sensitive. I really don't believe he has cellulitis however in view of everything I will continue and give him a course of Cipro. He is also on doxycycline as an immune modulator for the pyoderma. In addition to his original wound on the left lateral leg with surrounding erythema he has a wound on the right posterior calf which was an original biopsy site done by dermatology. This was felt to represent pathergy from pyoderma gangrenosum 04/16/17; pyoderma gangrenosum. Saw Dr. Lillia Carmel yesterday. He has been using topical antibiotics to both wound areas his original wound on the left and the biopsies/pathergy area on the right. There is definitely some improvement in the inflammation around the wound on the right although the patient states he has increasing sensitivity of the wounds. He is on prednisone 60 and doxycycline 1 as prescribed by Dr. Lillia Carmel. He is covering the topical antibiotic with gauze and putting  this in his own compression stocks and changing this daily. He states that Dr. Lottie Rater did a culture of the left leg wound yesterday 05/07/17; pyoderma gangrenosum. The patient saw Dr. Lillia Carmel yesterday and has a follow-up with her in one month. He is still using topical antibiotics to both wounds although he can't recall exactly what type. He is still on prednisone 60 mg. Dr. Lillia Carmel stated that the doxycycline could stop if we were in agreement. 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 The right medial leg was induced by biopsy/pathergy. The area on the right is essentially healed. Still on high-dose prednisone using topical antibiotics to the wound 07/09/17; pyoderma gangrenosum with wounds on the left lateral leg. The right medial leg has closed and remains closed. He  is still on prednisone 60. oHe tells me he missed his last dermatology appointment with Dr. Lillia Carmel but will make another appointment. He reports that her blood sugar at a recent screen in [place] was high 200's. He was 180 today. 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 I think he is going to require still much longer prednisone perhaps another 3 months before attempting to taper. In the meantime his wound is a lot better. Smaller. He is cleaning this off daily and applying topical antibiotics. When he was last in the clinic I thought about changing to [facility] and actually put in a couple of calls to dermatology although probably not during their business hours. 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 He continues on oral prednisone. He has been using triple antibiotic ointment. There is surface debris and we will transition to [facility] and have him return in 2 weeks. He has lost 30 pounds since his last appointment with lifestyle modification. He may benefit from topical steroid cream for treatment this can be considered at a later date. 08/22/17 on evaluation today patient appears to actually be doing rather well in regard to his left lateral lower extremity ulcer. He has actually been managed by Dr. Dellia Nims most recently. Patient is currently on oral steroids at this time. This seems to have been of benefit for him. Nonetheless his last visit was actually with Leah on 08/06/17. Currently he is not utilizing any topical steroid creams although this could be of benefit as well. No fevers, chills, nausea, or vomiting noted at this time. 09/05/17 on evaluation today patient appears to be doing better in regard to his left lateral lower extremity ulcer. He has been tolerating the dressing changes without complication. He is using Santyl with good effect. Overall I'm  very pleased with how things are standing at this point. Patient likewise is happy that this is doing better. 09/19/17 on evaluation today patient actually appears to be doing rather well in regard to his left lateral lower extremity ulcer. Again this is secondary to Pyoderma gangrenosum and he seems to be progressing well with the Santyl which is good news. He's not having any significant pain. 10/03/17 on evaluation today patient appears to be doing excellent in regard to his lower extremity wound on the left secondary to Pyoderma gangrenosum. He has been tolerating the Santyl without complication and in general I feel like he's making good progress. 10/17/17 on evaluation today patient appears to  be doing very well in regard to his left lateral lower surety ulcer. He has been tolerating the dressing changes without complication. There does not appear to be any evidence of infection he's alternating the Santyl and the triple antibiotic ointment every other day this seems to be doing well for him. 11/03/17 on evaluation today patient appears to be doing very well in regard to his left lateral lower extremity ulcer. He is been tolerating the dressing changes without complication which is good news. Fortunately there does not appear to be any evidence of infection which is also great news. Overall is doing excellent they are starting to taper down on the prednisone is down to 40 mg at this point it also started topical clobetasol for him. 11/17/17 on evaluation today patient appears to be doing well in regard to his left lateral lower surety ulcer. He's been tolerating the dressing changes without complication. He does note that he is having no pain, no excessive drainage or discharge, and overall he feels like things are going about how he would expect and hope they would. Overall he seems to have no evidence of infection at this time in my opinion which is good news. 12/04/17-He is seen in follow-up  evaluation for right lateral lower extremity ulcer. He has been applying topical steroid cream. Today's measurement show slight increase in size. Over the next 2 weeks we will transition to every other day Santyl and steroid cream. He has been encouraged to monitor for changes and notify clinic with any concerns 12/15/17 on evaluation today patient's left lateral motion the ulcer and fortunately is doing worse again at this point. This just since last week to this week has close to doubled in size according to the patient. I did not seeing last week's I do not have a visual to compare this to in our system was also down so we do not have all the charts and at this point. Nonetheless it does have me somewhat concerned in regard to the fact that again he was worried enough about it he has contact the dermatology that placed them back on the full strength, 50 mg a day of the prednisone that he was taken previous. He continues to alternate using clobetasol along with Santyl at this point. He is obviously somewhat frustrated. 12/22/17 on evaluation today patient appears to be doing a little worse compared to last evaluation. Unfortunately the wound is a little deeper and slightly larger than the last week's evaluation. With that being said he has made some progress in regard to the irritation surrounding at this time unfortunately despite that progress that's been made he still has a significant issue going on here. 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 ADIEL, ERNEY ([id number]) For that reason I discussed with him today the possibility of perform a wound culture to ensure there's nothing overtly infected. 01/06/18 on evaluation today patient's wound is larger and deeper than previously evaluated. With that being said it did appear that his wound was  infected after my last evaluation with him. Subsequently I did end up prescribing a prescription for Bactrim DS which she has been taking and having no complication with. Fortunately there does not appear to be any evidence of infection at this point in time as far as anything spreading, no want to touch, and overall I feel like things are  showing signs of improvement. 01/13/18 on evaluation today patient appears to be even a little larger and deeper than last time. There still muscle exposed in the base of the wound. Nonetheless he does appear to be less erythematous I do believe inflammation is calming down also believe the infection looks like it's probably resolved at this time based on what I'm seeing. No fevers, chills, nausea, or vomiting noted at this time. 01/30/18 on evaluation today patient actually appears to visually look better for the most part. Unfortunately those visually this looks better he does seem to potentially have what may be an abscess in the muscle that has been noted in the central portion of the wound. This is the first time that I have noted what appears to be fluctuance in the central portion of the muscle. With that being said I'm somewhat more concerned about the fact that this might indicate an abscess formation at this location. I do believe that an ultrasound would be appropriate. This is likely something we need to try to do as soon as possible. He has been switch to mupirocin ointment and he is no longer using the steroid ointment as prescribed by dermatology he sees them again next week he's been decreased from 60 to 40 mg of prednisone. 03/09/18 on evaluation today patient actually appears to be doing a little better compared to last time I saw him. There's not as much erythema surrounding the wound itself. He I did review his most recent infectious disease note which was dated 02/24/18. He saw Dr. Michel Bickers in [place]. 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 Patient is now off of antibiotics and they are continually observing this. There seems to be no change in the past two weeks in my pinion based on what the patient says and what I see today compared to what Dr. Megan Salon likely saw two weeks ago. No fevers, chills, nausea, or vomiting noted at this time. 03/23/18 on evaluation today patient's wound actually appears to be showing signs of improvement which is good news. He is currently still on the Dapsone. He is also working on tapering the prednisone to get off of this and Dr. Lottie Rater is working with him in this regard. Nonetheless overall I feel like the wound is doing well it does appear based on the infectious disease note that I reviewed from Dr. 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 I did also review the results of his ultrasound of left lower extremity which revealed there was a dentist tissue in the base of the wound without an abscess noted. 04/06/18 on evaluation today the patient's left lateral lower extremity ulcer actually appears to be doing fairly well which is excellent news. There does not appear to be any evidence of infection at this time which is also great news. Overall he still does have a significantly large ulceration although little by little he seems to be making progress. He is down to 10 mg a day of the prednisone. 04/20/18 on evaluation today patient actually appears to be doing excellent at this time in regard to his left lower extremity ulcer. He's making signs of good progress unfortunately this is taking much longer than we would really like to see but nonetheless he is making progress.  Fortunately there does not appear to be any evidence of infection at this time. No fevers, chills, nausea, or vomiting noted at  this time. The patient has not been using the Santyl due to the cost he hadn't got in this field yet. He's mainly been using the antibiotic ointment topically. Subsequently he also tells me that he really has not been scrubbing in the shower I think this would be helpful again as I told him it doesn't have to be anything too aggressive to even make it believe just enough to keep it free of some of the loose slough and biofilm on the wound surface. 05/11/18 on evaluation today patient's wound appears to be making slow but sure progress in regard to the left lateral lower extremity ulcer. He is been tolerating the dressing changes without complication. Fortunately there does not appear to be any evidence of infection at this time. He is still just using triple antibiotic ointment along with clobetasol occasionally over the area. He never got the Santyl and really does not seem to intend to in my pinion. 06/01/18 on evaluation today patient appears to be doing a little better in regard to his left lateral lower extremity ulcer. He states that overall he does not feel like he is doing as well with the Dapsone as he did with the prednisone. Nonetheless he sees his dermatologist later today and is gonna talk to them about the possibility of going back on the prednisone. Overall again I believe that the wound would be better if you would utilize Santyl but he really does not seem to be interested in going back to the [place] at this point. He has been using triple antibiotic ointment. 06/15/18 on evaluation today patient's wound actually appears to be doing about the same at this point. Fortunately there is no signs of infection at this time. He has made slight improvements although he continues to not really want to clean the wound bed at this point. He states that he just doesn't mess with it he doesn't want to cause any problems with everything else he has going on. He has been on medication, antibiotics  as prescribed by his dermatologist, for a staff infection of his lower extremities which is really drying out now and looking much better he tells me. Fortunately there is no sign of overall infection. 06/29/18 on evaluation today patient appears to be doing well in regard to his left lateral lower surety ulcer all things considering. Fortunately his staff infection seems to be greatly improved compared to previous. He has no signs of infection and this is drying up quite nicely. He is still the doxycycline for this is no longer on cental, Dapsone, or any of the other medications. His dermatologist has recommended possibility of an infusion but right now he does not want to proceed with that. 07/13/18 on evaluation today patient appears to be doing about the same in regard to his left lateral lower surety ulcer. Fortunately there's no signs of infection at this time which is great news. Unfortunately he still builds up a significant amount of Slough/biofilm of the surface of the wound he still is not really cleaning this as he should be appropriately. Again I'm able to easily with saline and gauze remove the majority of this on the surface which if you would do this at home would likely be a dramatic improvement for him as far as getting the area to improve. Nonetheless overall I still feel like he is making  progress is just very slow. I think Santyl will be of benefit for him as well. Still he has not gotten this as of this point. 07/27/18 on evaluation today patient actually appears to be doing little worse in regards of the erythema around the periwound region of the wound he also tells me that he's been having more drainage currently compared to what he was experiencing last time I saw him. He states not quite as bad as what he had because this was infected previously but nonetheless is still appears to be doing poorly. Fortunately there is no evidence of systemic infection at this point. The patient  tells me that he is not going to be able to afford the Santyl. He is still waiting to hear about the infusion therapy with his dermatologist. Apparently she wants an updated colonoscopy first. 08/10/18 on evaluation today patient appears to be doing better in regard to his left lateral lower extremity ulcer. Fortunately he is showing signs of improvement in this regard he's actually been approved for Remicade infusion's as well although this has not been scheduled as of yet. Fortunately there's no signs of active infection at this time in regard to the wound although he is having some issues with infection of the right lower extremity is been seen as dermatologist for this. Fortunately they are definitely still working with him trying to keep things under control. THANG, FLETT ([id number]) 09/07/18 on evaluation today patient is actually doing rather well in regard to his left lateral lower extremity ulcer. He notes these actually having some hair grow back on his extremity which is something he has not seen in years. He also tells me that the pain is really not giving them any trouble at this time which is also good news overall she is very pleased with the progress he's using a combination of the mupirocin along with the probate is all mixed. 09/21/18 on evaluation today patient actually appears to be doing fairly well all things considered in regard to his looks from the ulcer. He's been tolerating the dressing changes without complication. Fortunately there's no signs of active infection at this time which is good news he is still on all antibiotics or prevention of the staff infection. He has been on prednisone for time although he states it is gonna contact his dermatologist and see if she put them on a short course due to some irritation that he has going on currently. Fortunately there's no evidence of any overall worsening this is going very slow I think cental would be something that would be  helpful for him although he states that $50 for tube is quite expensive. He therefore is not willing to get that at this point. 10/06/18 on evaluation today patient actually appears to be doing decently well in regard to his left lateral leg ulcer. He's been tolerating the dressing changes without complication. Fortunately there's no signs of active infection at this time. Overall I'm actually rather pleased with the progress he's making although it's slow he doesn't show any signs of infection and he does seem to be making some improvement. I do believe that he may need a switch up and dressings to try to help this to heal more appropriately and quickly. 10/19/18 on evaluation today patient actually appears to be doing better in regard to his left lateral lower extremity ulcer. This is shown signs of having much less Slough buildup at this point due to the fact he has been using the [employer].  Obviously this is very good news. The overall size of the wound is not dramatically smaller but again the appearance is. 11/02/18 on evaluation today patient actually appears to be doing quite well in regard to his lower [place] ulcer. A lot of the skin around the ulcer is actually somewhat irritating at this point this seems to be more due to the dressing causing irritation from the adhesive that anything else. Fortunately there is no signs of active infection at this time. 11/24/18 on evaluation today patient appears to be doing a little worse in regard to his overall appearance of his lower extremity ulcer. There's more erythema and warmth around the wound unfortunately. He is currently on doxycycline which he has been on for some time. With that being said I'm not sure that seems to be helping with what appears to possibly be an acute cellulitis with regard to his left lower extremity ulcer. No fevers, chills, nausea, or vomiting noted at this time. 12/08/18 on evaluation today patient's wounds actually appears to  be doing significantly better compared to his last evaluation. He has been using Santyl along with alternating tripling about appointment as well as the steroid cream seems to be doing quite well and the wound is showing signs of improvement which is excellent news. Fortunately there's no evidence of infection and in fact his culture came back negative with only normal skin flora noted. 12/21/2018 upon evaluation today patient actually appears to be doing excellent with regard to his ulcer. This is actually the best that I have seen it since have been helping to take care of him. It is both smaller as well as less slough noted on the surface of the wound and seems to be showing signs of good improvement with new skin growing from the edges. He has been using just the triamcinolone he does wonder if he can get a refill of that ointment today. 01/04/2019 upon evaluation today patient actually appears to be doing well with regard to his left lateral lower extremity ulcer. With that being said it does not appear to be that he is doing quite as well as last time as far as progression is concerned. There does not appear to be any signs of infection or significant irritation which is good news. With that being said I do believe that he may benefit from switching to a collagen based dressing based on how clean The wound appears. 01/18/2019 on evaluation today patient actually appears to be doing well with regard to his wound on the left lower extremity. He is not made a lot of progress compared to where we were previous but nonetheless does seem to be doing okay at this time which is good news. There is no signs of active infection which is also good news. My only concern currently is I do wish we can get him into utilizing the collagen dressing his insurance would not pay for the supplies that we ordered although it appears that he may be able to order this through his supply company that he typically utilizes.  This is [employer]. Nonetheless he did try to order it during the office visit today and it appears this did go through. We will see if he can get that it is a different brand but nonetheless he has collagen and I do think will be beneficial. 02/01/2019 on evaluation today patient actually appears to be doing a little worse today in regard to the overall size of his wounds. Fortunately there is no  signs of active infection at this time. That is visually. Nonetheless when this is happened before it was due to infection. For that reason were somewhat concerned about that this time as well. 02/08/2019 on evaluation today patient unfortunately appears to be doing slightly worse with regard to his wound upon evaluation today. Is measuring a little deeper and a little larger unfortunately. I am not really sure exactly what is causing this to enlarge he actually did see his dermatologist she is going to see about initiating Humira for him. Subsequently she also did do steroid injections into the wound itself in the periphery. Nonetheless still nonetheless he seems to be getting a little bit larger he is gone back to just using the steroid cream topically which I think is appropriate. I would say hold off on the collagen for the time being is definitely a good thing to do. Based on the culture results which we finally did get the final result back regarding it shows staph as the bacteria noted again that can be a normal skin bacteria based on the fact however he is having increased drainage and worsening of the wound measurement wise I would go ahead and place him on an antibiotic today I do believe for this. 02/15/2019 on evaluation today patient actually appears to be doing somewhat better in regard to his ulcer. There is no signs of worsening at this time I did review his culture results which showed evidence of Staphylococcus aureus but not MRSA. Again this could just be more related to the normal skin  bacteria although he states the drainage has slowed down quite a bit he may have had a mild infection not just colonization. And was much smaller and then since around10/04/2019 on evaluation today patient appears to be doing unfortunately worse as far as the size of the wound. I really feel like that this is steadily getting larger again it had been doing excellent right at the beginning of September we have seen a steady increase in the area of the wound it is almost 2-1/2 times the size it was on September 1. Obviously this is a bad trend this is not wanting to see. For that reason we went back to using just the topical triamcinolone cream which does seem to help with inflammation. I checked him for bacteria by way of culture and nothing showed positive there. I am considering giving him a short course of a tapering steroid Dosepak today to see if that is can be beneficial for him. The patient is in agreement with giving that a try. 03/08/2019 on evaluation today patient appears to be doing very well in comparison to last evaluation with regard to his lower extremity ulcer. This is showing signs of less inflammation and actually measuring slightly smaller compared to last time every other week over the past month and a half he has been measuring larger larger larger. Nonetheless I do believe that the issue has been inflammation the prednisone does seem to [facility] [place] ([id number]) have been beneficial for him which is good news. No fevers, chills, nausea, vomiting, or diarrhea. 03/22/2019 on evaluation today patient appears to be doing about the same with regard to his leg ulcer. He has been tolerating the dressing changes without complication. With that being said the wound seems to be mostly arrested at its current size but really is not making any progress except for when we prescribed the prednisone. He did show some signs of dropping as far as the overall  size of the wound during that interval  week. Nonetheless this is something he is not on long-term at this point and unfortunately I think he is getting need either this or else the Humira which his dermatologist has discussed try to get approval for. With that being said he will be seeing his dermatologist on the 11th of this month that is November. 04/19/2019 on evaluation today patient appears to be doing really about the same the wound is measuring slightly larger compared to last time I saw him. He has not been into the office since November 2 due to the fact that he unfortunately had Covid as that his entire family. He tells me that it was rough but they did pull-through and he seems to be doing much better. Fortunately there is no signs of active infection at this time. No fevers, chills, nausea, vomiting, or diarrhea. 05/10/2019 on evaluation today patient unfortunately appears to be doing significantly worse as compared to last time I saw him. He does tell me that he has had his first dose of Humira and actually is scheduled to get the next one in the upcoming week. With that being said he tells me also that in the past several days he has been having a lot of issues with green drainage she showed me a picture this is more blue-green in color. He is also been having issues with increased sloughy buildup and the wound does appear to be larger today. Obviously this is not the direction that we want everything to take based on the starting of his Humira. Nonetheless I think this is definitely a result of likely infection and to be honest I think this is probably Pseudomonas causing the infection based on what I am seeing. 05/24/2019 on evaluation today patient unfortunately appears to be doing significantly worse compared to his prior evaluation with me 2 weeks ago. I did review his culture results which showed that he does have Staph aureus as well as Pseudomonas noted on the culture. Nonetheless the Levaquin that I prescribed for him  does not appear to have been appropriate and in fact he tells me he is no longer experiencing the green drainage and discharge that he had at the last visit. Fortunately there is no signs of active infection at this time which is good news although the wound has significantly worsened it in fact is much deeper than it was previous. We have been utilizing up to this point triamcinolone ointment as the prescription topical of choice but at this time I really feel like that the wound is getting need to be packed in order to appropriately manage this due to the deeper nature of the wound. Therefore something along the lines of an alginate dressing may be more appropriate. 05/31/2019 upon inspection today patient's wound actually showed signs of doing poorly at this point. Unfortunately he just does not seem to be making any good progress despite what we have tried. He actually did go ahead and pick up the Cipro and start taking that as he was noticing more green drainage he had previously completed the Levaquin that I prescribed for him as well. Nonetheless he missed his appointment for the seventh last week on Wednesday with the wound care center and [facility] where his dermatologist referred him. Obviously I do think a second opinion would be helpful at this point especially in light of the fact that the patient seems to be doing so poorly despite the fact that we  have tried everything that I really know how at this point. The only thing that ever seems to have helped him in the past is when he was on high doses of continual steroids that did seem to make a difference for him. 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 06/07/2019 upon evaluation today patient unfortunately appears to be doing worse yet again with regard to his wound. In fact I  am starting to question whether or not he may have a fluid pocket in the muscle at this point based on the bulging and the soft appearance to the central portion of the muscle area. There is not anything draining from the muscle itself at this time which is good news but nonetheless the wound is expanding. I am not really seeing any results of the Humira as far as overall wound progression based on what I am seeing at this point. The patient has been referred for second opinion with regard to his wound to the [facility] wound care center by his dermatologist which I definitely am not in opposition to. Unfortunately we tried multiple dressings in the past including collagen, alginate, and at one point even Hydrofera Blue. With that being said he is never really used it for any significant amount of time due to the fact that he often complains of pain associated with these dressings and then will go back to either using the Santyl which she has done intermittently or more frequently the triamcinolone. He is also using his own compression stockings. We have wrapped him in the past but again that was something else that he really was not a big fan of. 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 He seemed to do much better when he was on the steroids although honestly I understand that the steroids are not necessarily the best medication to be on long-term obviously 06/14/2019 on evaluation today patient appears to be doing actually a little bit better with regard to the overall appearance with his leg. Unfortunately he does continue to have issues with what appears to be some fluid underneath  the muscle although he did see the wound specialty center at [facility] last week their main goals were to see about infusion therapy in place of the Humira as they feel like that is not quite strong enough. They also recommended that we continue with the treatment otherwise as we are they felt like that was appropriate and they are okay with him continuing to follow-up here with [place] in that regard. With that being said they are also sending him to the vein specialist there to see about vein stripping and if that would be of benefit for him. Subsequently they also did not really address whether or not an ultrasound of the muscle area to see if there is anything that needs to be addressed here would be appropriate or not. For that reason I discussed this with him last week I think we may proceed  down that road at this point. 06/21/2019 upon evaluation today patient's wound actually appears to be doing slightly better compared to previous evaluations. I do believe that he has made a difference with regard to the progression here with the use of oral steroids. Again in the past has been the only thing that is really calm things down. He does tell me that from [facility] is gotten a good news from there that there are no further vein stripping that is necessary at this point. I do not have that available for review today although the patient did relay this to me. He also did obtain and have the ultrasound of the wound completed which I did sign off on today. It does appear that there is no fluid collection under the muscle this is likely then just edematous tissue in general. That is also good news. Overall I still believe the inflammation is the main issue here. He did inquire about the possibility of a wound VAC again with the muscle protruding like it is I am not really sure whether the wound VAC is necessarily ideal or not. That is something we will have to consider although I do believe he may need compression  wrapping to try to help with edema control which could potentially be of benefit. 06/28/2019 on evaluation today patient appears to be doing slightly better measurement wise although this is not terribly smaller he least seems to be trending towards that direction. With that being said he still seems to have purulent drainage noted in the wound bed at this time. He has been on Levaquin followed by Cipro over the past month. Unfortunately he still seems to have some issues with active infection at this time. I did perform a culture last week in order to evaluate and see if indeed there was still anything going on. Subsequently the culture did come back showing Pseudomonas which is consistent with the drainage has been having which is blue-green in color. He also has had an odor that again was somewhat consistent with Pseudomonas as well. Long story short it appears that the culture showed an intermediate finding with regard to how well the Cipro will work for the Pseudomonas infection. Subsequently being that he does not seem to be clearing up and at best what we are doing is just keeping this at [place] I think he may need to see infectious disease to discuss IV antibiotic options. SAGAR, TENGAN ([id number]) 07/05/2019 upon evaluation today patient appears to be doing okay in regard to his leg ulcer. He has been tolerating the dressing changes at this point without complication. Fortunately there is no signs of active infection at this time which is good news. No fevers, chills, nausea, vomiting, or diarrhea. With that being said he does have an appointment with infectious disease tomorrow and his primary care on Wednesday. Again the reason for the infectious disease referral was due to the fact that he did not seem to be fully resolving with the use of oral antibiotics and therefore we were thinking that IV antibiotic therapy may be necessary secondary to the fact that there was an intermediate finding for  how effective the Cipro may be. Nonetheless again he has been having a lot of purulent and even green drainage. Fortunately right now that seems to have calmed down over the past week with the reinitiation of the oral antibiotic. Nonetheless we will see what Dr. Megan Salon has to say. 07/12/2019 upon evaluation today patient appears to be doing about  the same at this point in regard to his left lower extremity ulcer. Fortunately there is no signs of active infection at this time which is good news I do believe the Levaquin has been beneficial I did review Dr. Hale Bogus note and to be honest I agree that the patient's leg does appear to be doing better currently. What we found in the past as he does not seem to really completely resolve he will stop the antibiotic and then subsequently things will revert back to having issues with blue-green drainage, increased pain, and overall worsening in general. Obviously that is the reason I sent him back to infectious disease. 07/19/2019 upon evaluation today patient appears to be doing roughly the same in size there is really no dramatic improvement. He has started back on the Levaquin at this point and though he seems to be doing okay he did still have a lot of blue/green drainage noted on evaluation today unfortunately. I think that this is still indicative more likely of a Pseudomonas infection as previously noted and again he does see Dr. Megan Salon in just a couple of days. I do not know that were really able to effectively clear this with just oral antibiotics alone based on what I am seeing currently. Nonetheless we are still continue to try to manage as best we can with regard to the patient and his wound. I do think the wrap was helpful in decreasing the edema which is excellent news. No fevers, chills, nausea, vomiting, or diarrhea. 07/26/2019 upon evaluation today patient appears to be doing slightly better with regard to the overall appearance of the muscle  there is no dark discoloration centrally. Fortunately there is no signs of active infection at this time. No fevers, chills, nausea, vomiting, or diarrhea. Patient's wound bed currently the patient did have an appointment with Dr. Megan Salon at infectious disease last week. With that being said Dr. Megan Salon the patient states was still somewhat hesitant about put him on any IV antibiotics he wanted [place] to repeat cultures today and then see where things go going forward. He does look like Dr. Megan Salon because of some improvement the patient did have with the Levaquin wanted [place] to see about repeating cultures. If it indeed grows the Pseudomonas again then he recommended a possibility of considering a PICC line placement and IV antibiotic therapy. He plans to see the patient back in 1 to 2 weeks. 08/02/2019 upon evaluation today patient appears to be doing poorly with regard to his left lower extremity. We did get the results of his culture back it shows that he is still showing evidence of Pseudomonas which is consistent with the purulent/blue-green drainage that he has currently. Subsequently the culture also shows that he now is showing resistance to the oral fluoroquinolones which is unfortunate as that was really the only thing to treat the infection prior. I do believe that he is looking like this is going require IV antibiotic therapy to get this under control. Fortunately there is no signs of systemic infection at this time which is good news. The patient does see Dr. Megan Salon tomorrow. 08/09/2019 upon evaluation today patient appears to be doing better with regard to his left lower extremity ulcer in regard to the overall appearance. He is currently on IV antibiotic therapy. As ordered by Dr. Megan Salon. Currently the patient is on ceftazidime which she is going to take for the next 2 weeks and then follow-up for 4 to 5-week appointment with Dr. Megan Salon. The patient  started this this past Friday  symptoms have not for a total of 3 days currently in full. 08/16/2019 upon evaluation today patient's wound actually does show muscle in the base of the wound but in general does appear to be much better as far as the overall evidence of infection is concerned. In fact I feel like this is for the most part cleared up he still on the IV antibiotics he has not completed the full course yet but I think he is doing much better which is excellent news. 08/23/2019 upon evaluation today patient appears to be doing about the same with regard to his wound at this point. He tells me that he still has pain unfortunately. Fortunately there is no evidence of systemic infection at this time which is great news. There is significant muscle protrusion. 09/13/19 upon evaluation today patient appears to be doing about the same in regard to his leg unfortunately. He still has a lot of drainage coming from the ulceration there is still muscle exposed. With that being said the patient's last wound culture still showed an intermediate finding with regard to the Pseudomonas he still having the bluish/green drainage as well. Overall I do not know that the wound has completely cleared of infection at this point. Fortunately there is no signs of active infection systemically at this point which is good news. 09/20/2019 upon evaluation today patient's wound actually appears to be doing about the same based on what I am seeing currently. I do not see any signs of systemic infection he still does have evidence of some local infection and drainage. He did see Dr. Megan Salon last week and Dr. Megan Salon states that he probably does need a different IV antibiotic although he does not want to put him on this until the patient begins the Remicade infusion which is actually scheduled for about 10 days out from today on 13 May. Following that time Dr. Megan Salon is good to see him back and then will evaluate the feasibility of starting him on the  IV antibiotic therapy once again at that point. I do not disagree with this plan I do believe as Dr. Megan Salon stated in his note that I reviewed today that the patient's issue is multifactorial with the pyoderma being 1 aspect of this that were hoping the Remicade will be helpful for her. In the meantime I think the gentamicin is, helping to keep things under decent okay control in regard to the ulcer. 09/27/2019 upon evaluation today patient appears to be doing about the same with regard to his wound still there is a lot of muscle exposure though he does have some hyper granulation tissue noted around the edge and actually some granulation tissue starting to form over the muscle which is actually good news. Fortunately there is no evidence of active infection which is also good news. His pain is less at this point. 5/21; this is a patient I have not seen in a long time. 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 He has a large wound on the left lateral leg with protruding muscle. He comes in the clinic today showing the same area on his left medial ankle. He says there is been a spot there for some time although we have not previously defined this. Today he has a clearly defined area with slight amount of skin breakdown surrounded by raised areas with a purplish hue in color. This is not painful he says it is irritated. This looks  distinctly like I might imagine pyoderma starting 10/25/2019 upon evaluation today patient's wound actually appears to be making some progress. He still has muscle protruding from the lateral portion of his left leg but fortunately the new area that they were concerned about at his last visit does not appear to have opened at this point. He is currently on Remicade infusions and seems to be doing better in my opinion in fact the wound itself seems to be overall much better. The purplish discoloration that he did have seems to have resolved  and I think that is a good sign that hopefully the Remicade is doing its job. He does have some biofilm noted over the surface of the wound. 11/01/2019 on evaluation today patient's wound actually appears to be doing excellent at this time. Fortunately there is no evidence of active infection and overall I feel like he is making great progress. The Remicade seems to be due excellent job in my opinion. FITZHUGH, VIZCARRONDO ([id number]) 11/08/19 evaluation today vision actually appears to be doing quite well with regard to his weight ulcer. He's been tolerating dressing changes without complication. Fortunately there is no evidence of infection. No fevers, chills, nausea, or vomiting noted at this time. Overall states that is having more itching than pain which is actually a good sign in my opinion. 12/13/2019 upon evaluation today patient appears to be doing well today with regard to his wound. He has been tolerating the dressing changes without complication. Fortunately there is no sign of active infection at this time. No fevers, chills, nausea, vomiting, or diarrhea. Overall I feel like the infusion therapy has been very beneficial for him. 01/06/2020 on evaluation today patient appears to be doing well with regard to his wound. This is measuring smaller and actually looks to be doing better. Fortunately there is no signs of active infection at this point. No fevers, chills, nausea, vomiting, or diarrhea. With that being said he does still have the blue-green drainage but this does not seem to be causing any significant issues currently. He has been using the gentamicin that does seem to be keeping things under decent control at this point. He goes later this morning for his next infusion therapy for the pyoderma which seems to also be very beneficial. 02/07/2020 on evaluation today patient appears to be doing about the same in regard to his wounds currently. Fortunately there is no signs of active infection  systemically he does still have evidence of local infection still using gentamicin. He also is showing some signs of improvement albeit slowly I do feel like we are making some progress here. 02/21/2020 upon evaluation today patient appears to be making some signs of improvement the wound is measuring a little bit smaller which is great news and overall I am very pleased with where he stands currently. He is going to be having infusion therapy treatment on the 15th of this month. Fortunately there is no signs of active infection at this time. 03/13/2020 I do believe patient's wound is actually showing some signs of improvement here which is great news. He has continue with the infusion therapy through rheumatology/dermatology at [facility]. That does seem to be beneficial. I still think he gets as much benefit from this as he did from the prednisone initially but nonetheless obviously this is less harsh on his body that the prednisone as far as they are concerned. 03/31/2020 on evaluation today patient's wound actually showing signs of some pretty good improvement in regard to the  overall appearance of the wound bed. There is still muscle exposed though he does have some epithelial growth around the edges of the wound. Fortunately there is no signs of active infection at this time. No fevers, chills, nausea, vomiting, or diarrhea. 04/24/2020 upon evaluation today patient appears to be doing about the same in regard to his leg ulcer. He has been tolerating the dressing changes without complication. Fortunately there is no signs of active infection at this time. No fevers, chills, nausea, vomiting, or diarrhea. With that being said he still has a lot of irritation from the bandaging around the edges of the wound. We did discuss today the possibility of a referral to plastic surgery. 05/22/2020 on evaluation today patient appears to be doing well with regard to his wounds all things considered. He has not been able  to get the Chantix apparently there is a recall nurse that I was unaware of put out by [employer] involuntarily. Nonetheless for now I am and I have to do some research into what may be the best option for him to help with quitting in regard to smoking and we discussed that today. 06/26/2020 upon evaluation today patient appears to be doing well with regard to his wound from the standpoint of infection I do not see any signs of infection at this point. With that being said unfortunately he is still continuing to have issues with muscle exposure and again he is not having a whole lot of new skin growth unfortunately. There does not appear to be any signs of active infection at this time. No fevers, chills, nausea, vomiting, or diarrhea. 07/10/2020 upon evaluation today patient appears to be doing a little bit more poorly currently compared to where he was previous. I am concerned currently about an active infection that may be getting worse especially in light of the increased size and tenderness of the wound bed. No fevers, chills, nausea, vomiting, or diarrhea. 07/24/2020 upon evaluation today patient appears to be doing poorly in regard to his leg ulcer. He has been tolerating the dressing changes without complication but unfortunately is having a lot of discomfort. 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 Subsequently I think he is going require possibly IV antibiotics to get this under control. I am very concerned about the severity of his infection and the amount of discomfort he is having. 07/31/2020 upon evaluation today patient appears to be doing about the same in regard to his leg wound. He did see Dr. Megan Salon and Dr. Megan Salon is actually going to start him on IV antibiotics. He goes for the PICC line tomorrow. With that being said there do not have that run for 2 weeks and then see how things are doing and depending on how he  is progressing they may extend that a little longer. Nonetheless I am glad this is getting ready to be in place and definitely feel it may help the patient. In the meantime is been using mainly triamcinolone to the wound bed has an anti-inflammatory. 08/07/2020 on evaluation today patient appears to be doing well with regard to his wound compared even last week. In the interim he has gotten the PICC line placed and overall this seems to be doing excellent. There does not appear to be any evidence of infection which is great news systemically although locally of course has had the infection this appears to be improving with the use of the antibiotics. 08/14/2020 upon evaluation today  patient's wound actually showing signs of excellent improvement. Overall the irritation has significantly improved the drainage is back down to more of a normal level and his pain is really pretty much nonexistent compared to what it was. Obviously I think that this is significantly improved secondary to the IV antibiotic therapy which has made all the difference in the world. Again he had a resistant form of Pseudomonas for which oral antibiotics just was not cutting it. Nonetheless I do think that still we need to consider the possibility of a surgical closure for this wound is been open so long and to be honest with muscle exposed I think this can be very hard to get this to close outside of this although definitely were still working to try to do what we can in that regard. 08/21/2020 upon evaluation today patient appears to be doing very well with regard to his wounds on the left lateral lower extremity/calf area. Fortunately there does not appear to be signs of active infection which is great news and overall very pleased with where things stand today. He is actually wrapping up his treatment with IV antibiotics tomorrow. After that we will see where things go from there. 08/28/2020 upon evaluation today patient appears  to be doing decently well with regard to his leg ulcer. There does not appear to be any signs of active infection which is great news and overall very pleased with where things stand today. No fevers, chills, nausea, vomiting, or diarrhea. 09/18/2020 upon evaluation today patient appears to be doing well with regard to his infection which I feel like is better. Unfortunately he is not doing as well with regard to the overall size of the wound which is not nearly as good at this point. I feel like that he may be having an issue here with the pyoderma being somewhat out of control. I think that he may benefit from potentially going back and talking to the dermatologist about ANTHONNY, SCHILLER ([id number]) what to do from the pyoderma standpoint. I am not certain if the infusions are helping nearly as much is what the prednisone did in the past. 10/02/2020 upon evaluation today patient appears to be doing well with regard to his leg ulcer. He did go to the [facility]iatric nurse. Unfortunately they feel like there is a 10% chance that most that he would be able to heal and that the skin graft would take. Obviously this has led him to not be able to go down that path as far as treatment is concerned. Nonetheless he does seem to be doing a little bit better with the prednisone that I gave him last time. I think that he may need to discuss with dermatology the possibility of long-term prednisone as that seems to be what is most helpful for him to be perfectly honest. I am not sure the Remicade is really doing the job. 10/17/2020 upon evaluation today patient appears to be doing a little better in regard to his wound. In fact the case has been since we did the prednisone on May 2 for him that we have noticed a little bit of improvement each time we have seen a size wise as well as appearance wise as well as pain wise. I think the prednisone has had a greater effect then the infusion therapy has to be perfectly honest. With  that being said the patient also feels significantly better compared to what he was previous. All of this is good news but nonetheless I  am still concerned about the fact that again we are really not set up to long-term manage him as far as prednisone is concerned. Obviously there are things that you need to be watched I completely understand the risk of prednisone usage as well. 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 With all that being said I do believe that we can give him another round of the prednisone which she is requesting today because of the improvement that he seen since we did that first round. 10/30/2020 upon evaluation today patient's wound actually is showing signs of doing quite well. There does not appear to be any evidence of infection which is great news and overall very pleased with where things stand today. No fevers, chills, nausea, vomiting, or diarrhea. He tells me that the prednisone still has seem to have helped he wonders if we can extend that for just a little bit longer. He did not have the appointment with a dermatologist although he did have an infusion appointment last Friday. That was at [facility]. With that being said he tells me he could not do both that as well as the appointment with the physician on the same day therefore that is can have to be rescheduled. I really want to see if there is anything they feel like that could be done differently to try to help this out as I am not really certain that the infusions are helping significantly here. 11/13/2020 upon evaluation today patient unfortunately appears to be doing somewhat poorly in regard to his wound I feel like this is actually worsening from the standpoint of the pyoderma spreading. I still feel like that he may need something different as far as trying to manage this going forward. Again we did the prednisone unfortunately his blood sugars are not doing so well  because of this. Nonetheless I believe that the patient likely needs to try topical steroid. We have done triamcinolone for a while I think going with something stronger such as clobetasol could be beneficial again this is not something I do lightly I discussed this with the patient that again this does not normally put underneath an occlusive dressing. Nonetheless I think a thin film as such could help with some of the stronger anti-inflammatory effects. We discussed this today. He would like to try to give this a trial for the next couple weeks. I definitely think that is something that we can do. Evaluate7/03/2021 and today patient's wound bed actually showed signs of doing really about the same. There was a little expansion of the size of the wound and that leading edge that we done looking out although the clobetasol does seem to have slowed this down a bit in my opinion. There is just 1 small area that still seems to be progressing based on what I see. Nonetheless I am concerned about the fact this does not seem to be improving if anything seems to be doing a little bit worse. I do not know that the infusions are really helping him much as next infusion is August 5 his appointment with dermatology is July 25. Either way I really think that we need to have a conversation potentially about this and I am actually going to see if I can talk with Dr. Lillia Carmel in order to see where things stand as well. 12/11/2020 upon evaluation today patient appears to be doing worse in regard to his leg ulcer. Unfortunately I just  do not think this is making the progress that I would like to see at this point. Honestly he does have an appointment with dermatology and this is in 2 days. I am wondering what they may have to offer to help with this. Right now what I am seeing is that he is continuing to show signs of worsening little by little. Obviously that is not great at all. Is the exact opposite of what we are  looking for. 12/18/2020 upon evaluation today patient appears to be doing a little better in regard to his wound. The dermatologist actually did do some steroid injections into the wound which does seem to have been beneficial in my opinion. That was on the 25th already this looks a little better to me than last time I saw him. With that being said we did do a culture and this did show that he has Staph aureus noted in abundance in the wound. With that being said I do think that getting him on an oral antibiotic would be appropriate as well. Also think we can compression wrap and this will make a difference as well. 12/28/2020 upon evaluation today patient's wound is actually showing signs of doing much better. I do believe the compression wrap is helping he has a lot of drainage but to be honest I think that the compression is helping to some degree in this regard as well as not draining through which is also good news. No fevers, chills, nausea, vomiting, or diarrhea. 01/04/2021 upon evaluation today patient appears to be doing well with regard to his wound. Overall things seem to be doing quite well. He did have a little bit of reaction to the CarboFlex Sorbact he will be using that any longer. With that being said he is controlled as far as the drainage is concerned overall and seems to be doing quite well. I do not see any signs of active infection at this time which is great news. No fevers, chills, nausea, vomiting, or diarrhea. 01/11/2021 upon evaluation today patient appears to be doing well with regard to his wounds. He has been tolerating the dressing changes without complication. Fortunately there does not appear to be any signs of active infection at this time which is great news. Overall I am extremely pleased with where we stand currently. No fevers, chills, nausea, vomiting, or diarrhea. Where using clobetasol in the wound bed he has a lot of new skin growth which is awesome as  well. 01/18/2021 upon evaluation today patient appears to be doing very well in regard to his leg ulcer. He has been tolerating the dressing changes without complication. Fortunately there does not appear to be any signs of active infection which is great news. In general I think that he is making excellent progress 01/25/2021 upon evaluation today patient appears to be doing well with regard to his wound on the leg. I am actually extremely pleased with where things stand today. There does not appear to be any signs of active infection which is great news and overall I think that we are definitely headed in the appropriate direction based on what I am seeing currently. There does not appear to be any signs of active infection also excellent news. 02/06/2021 upon evaluation today patient appears to be doing well with regard to his wound. Overall visually this is showing signs of significant improvement which is great news. I do not see any signs of active infection systemically which is great even locally I do  not think that we are seeing any major complications here. We did do fluorescence imaging with the [employer] today. The patient does have some odor and drainage noted and again this is something that I think would benefit him to probably come more frequently for nurse visits. 02/19/2021 upon evaluation today patient actually appears to be doing quite well in regard to his wound. He has been tolerating the dressing Brandes, Dagoberto ([id number]) changes without complication and overall I think that this is making excellent progress. I do not see any evidence of active infection at this point which is great news as well. No fevers, chills, nausea, vomiting, or diarrhea. 10/10; wound is made nice progress healthy granulation with a nice rim of epithelialization which seems to be expanding even from last week he has a deeper area in the inferior part of the more distal part of the wound with not quite as  healthy as surface. This area will need to be followed. Using clobetasol and Hydrofera Blue 03/05/2021 upon evaluation today patient appears to be doing very well in regard to his leg ulcer. He has been tolerating dressing changes without complication. Fortunately there does not appear to be any signs of active infection which is great news and overall I am extremely pleased with where we stand currently. 03/12/2021 upon evaluation today patient appears to be doing well with regard to his wound in fact this is extremely extremely good based on what we are seeing today there does not appear to be any signs of active infection and overall I think that he is doing awesome from the standpoint of healing in general. I am extremely pleased with how things seem to be progressing with regard to this pyoderma. Clobetasol has done wonders for him. I think the compression wrapping has also been of great benefit. 03/19/2021 upon evaluation today patient appears to be doing well with regard to his wound. He is tolerating the dressing changes without complication. In fact I feel like that he is actually making excellent progress at this point based on what I am seeing. No fevers, chills, nausea, vomiting, or diarrhea. 03/26/2021 upon evaluation today patient appears to be doing well with regard to his wound. This again is measuring smaller and looking better. Again the progress is slow but nonetheless continual with what we have been seeing. I do believe that the current plan is doing awesome for him. 04/09/2021 upon evaluation today patient appears to be doing well with regard to his leg ulcer. This is showing signs of excellent improvement the muscle is completely closed over and there does not appear to be any evidence of inflammation at this point his drainage is significantly improved. Overall I think that he would be a good candidate for looking into a skin substitute at this point as well. We will get a look  into some approvals in that regard. Potentially TheraSkin as well as Apligraf could both be considered just depending on insurance coverage. 04/16/2021 upon evaluation today patient appears to be doing well at this point. He has been approved for the Apligraf which we could definitely order although I would like to try to get the TheraSkin approved if at all possible. I did fax notes into them today and I Georgina Peer try to give a call as well. Overall the wound appears to be doing decently well today. 04/20/2021 upon evaluation today patient actually appears to be doing quite well in regard to his wound with that being said we are trying  to see what we can do about speeding up the healing process. For that reason we did discuss the possibility of a skin substitute. We got the Apligraf approved. For that reason we will get a go ahead and see what we can do with the Apligraf at this point. I am still trying to get the TheraSkin approved but I have not heard anything from the insurance company yet I have called and talked to them earlier in the week and to be honest this was about half an hour that I spent on the phone they told me I would hear something within 10-15 business days. 04/27/2021 upon evaluation today patient appears to be doing excellent in regard to his wound. I do believe the Apligraf has been beneficial. With that being said he has had a little bit of increased pain has been a little bit concerned. I do want to go ahead and apply his steroid cream today the clobetasol and then put the Apligraf over I just do not think I want to risk not putting the clobetasol on based on what is been going on here currently. Patient voiced understanding. 05/04/2021 upon evaluation today patient is making excellent improvement. Overall there is definitely decrease in the size of the wound today and I am very pleased in that regard. I do not see any signs of active infection locally nor systemically at this  point. No fevers, chills, nausea, vomiting, or diarrhea. 05/11/2021 upon evaluation today patient appears to be doing well with regard to his wound. He has been tolerating Apligraf's without complication and is making excellent progress this is application #4 today. 12/30; patient here for Apligraf application. Appears to be doing well small wound there has been a lot of healing 05/28/2021 upon evaluation today patient appears to be doing well with regard to his wound. Has been tolerating the dressing changes without complication. Fortunately I do not see any signs of active infection locally nor systemically at this point which is great news. He is done with the Apligraf and the first round and overall this has filled in quite nicely I am not even certain he needs any additional at the moment. I would actually try to go back to clobetasol with [facility] which previously was doing well for him. 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 He is using clobetasol and Hydrofera Blue. Surface area of the wound is smaller and the surface looks very healthy. 06/11/2021 upon evaluation today patient actually appears to be showing signs of good improvement which is great news. Fortunately I do not see any signs of active infection at this time which is also excellent news. I do believe that the patient is doing well with the clobetasol and Hydrofera Blue. He does have some irritation and itching around the rest of the leg will use some triamcinolone over this area. 06/18/2021 upon evaluation today patient appears to be doing well with regard to his wound. He is showing signs of excellent improvement and overall I am extremely pleased with where we stand today. We are moving in the right direction. 06/25/2021 upon evaluation today patient appears to be doing well with regard to his wound. In fact this is doing also not showing signs of excellent improvement  overall very pleased with where we stand today. I do not see any evidence of active infection locally or systemically at this time which is also great news. 07/02/2021 upon evaluation patient's wound bed actually  showed signs of good granulation and epithelization at this point. And this is measuring significantly smaller and overall I am extremely pleased with where we stand today. No fevers, chills, nausea, vomiting, or diarrhea. 07/09/2021 upon evaluation patient's wound bed actually showed signs of good granulation and epithelization at this point. Fortunately there does not appear to be any evidence of active infection locally or systemically which is great news and overall I am extremely pleased with where we stand at this point. 07/16/2021 upon evaluation today patient appears to be doing well with regard to his wound all things considered although it is maybe a little bit larger than what its been. This does have me a little concerned. Actually question about whether anything is changed and initially he told me know. In fact it was not until the end of the visit that he actually did mention that he had missed his infusion in January. This very well may be [place], Klein ([id number]) what is because the difference is also seeing currently. That definitely has seem to been helping more recently. 07/23/2021 upon evaluation today patient's wound is yet again larger despite the switch to a collagen which was not beneficial. Subsequently I am going to at this point check on a couple things. First and foremost I do want to go ahead and do a culture. I think that there is a chance he could have a low-lying infection that may be part of the issue here. Subsequently I am also probably can go ahead and place him on Bactrim DS which I think is a good option and has done well with them in the past. Again if this is an infection that should hopefully help to turn things around. With all that being said I do  believe that the patient should hopefully be able to turn this around with reinitiation of the infusion therapy which will be going on this week and subsequently getting on the antibiotic. 3/13; patient presents for follow-up. He has no issues or complaints today. He states he is currently on oral antibiotics for previous culture done in the office. Electronic Signature(s) Signed: 07/30/2021 9:05:59 AM By: Kalman Shan DO Entered By: Kalman Shan on 07/30/2021 08:48:20 [name] ([id number]) -------------------------------------------------------------------------------- Physical Exam Details Patient Name: [name] Date of Service: 07/30/2021 8:00 AM Medical Record Number: [id number] Patient Account Number: [id number] Date of Birth/Sex: 02/25/1981 (40 y.o. M) Treating RN: Carlene Coria Primary Care Provider: [name] Other Clinician: Referring Provider: [name] Treating Provider/Extender: Yaakov Guthrie in Treatment: 60 Constitutional . Cardiovascular . Psychiatric . Notes Left lower extremity: To the lateral aspect there is an open wound with granulation tissue present. No surrounding signs of infection. Electronic Signature(s) Signed: 07/30/2021 9:05:59 AM By: Kalman Shan DO Entered By: Kalman Shan on 07/30/2021 08:49:04 [name] ([id number]) -------------------------------------------------------------------------------- Physician Orders Details Patient Name: [name] Date of Service: 07/30/2021 8:00 AM Medical Record Number: [id number] Patient Account Number: [id number] Date of Birth/Sex: [birth year] (40 y.o. M) Treating RN: Carlene Coria Primary Care Provider: [name] Other Clinician: Referring Provider: [name] Treating Provider/Extender: Yaakov Guthrie in Treatment: [phone] Verbal / Phone Orders: No Diagnosis Coding Follow-up Appointments o Return Appointment in 1 week. o Nurse Visit as  needed - twice a week Bathing/ Shower/ Hygiene o Clean wound with Normal Saline or wound cleanser. o May shower with wound dressing protected with water repellent cover or cast protector. Edema Control - Lymphedema / Segmental Compressive Device / Other o Optional: One layer of unna paste  to top of compression wrap (to act as an anchor). o Elevate, Exercise Daily and Avoid Standing for Long Periods of Time. o Elevate legs to the level of the heart and pump ankles as often as possible o Elevate leg(s) parallel to the floor when sitting. Wound Treatment Wound #1 - Lower Leg Wound Laterality: Left, Lateral Cleanser: Wound Cleanser 3 x Per Week/30 Days Discharge Instructions: Wash your hands with soap and water. Remove old dressing, discard into plastic bag and place into trash. Cleanse the wound with Wound Cleanser prior to applying a clean dressing using gauze sponges, not tissues or cotton balls. Do not scrub or use excessive force. Pat dry using gauze sponges, not tissue or cotton balls. Topical: Clobetasol Propionate ointment 0.05%, 60 (g) tube 3 x Per Week/30 Days Discharge Instructions: Pt to bring to appt- Primary Dressing: Hydrofera Blue Ready Transfer Foam, 4x5 (in/in) 3 x Per Week/30 Days Discharge Instructions: Apply Hydrofera Blue Ready to wound bed as directed Secondary Dressing: (NON-BORDER) Zetuvit Plus Silicone NON-BORDER 5x5 (in/in) 3 x Per Week/30 Days Compression Wrap: Medichoice 4 layer Compression System, 35-40 mmHG (Generic) 3 x Per Week/30 Days Discharge Instructions: Apply multi-layer wrap as directed. Electronic Signature(s) Signed: 07/30/2021 9:05:59 AM By: Kalman Shan DO Entered By: Kalman Shan on 07/30/2021 08:52:27 [name] ([id number]) -------------------------------------------------------------------------------- Problem List Details Patient Name: [name] Date of Service: 07/30/2021 8:00 AM Medical Record Number:  [id number] Patient Account Number: [id number] Date of Birth/Sex: January 04, 1981 (40 y.o. M) Treating RN: Carlene Coria Primary Care Provider: [name] Other Clinician: Referring Provider: [name] Treating Provider/Extender: Yaakov Guthrie in Treatment: [phone] Active Problems ICD-10 Encounter Code Description Active Date MDM Diagnosis I87.2 Venous insufficiency (chronic) (peripheral) 12/04/2016 No Yes L97.222 Non-pressure chronic ulcer of left calf with fat layer exposed 12/04/2016 No Yes E11.622 Type 2 diabetes mellitus with other skin ulcer 04/09/2021 No Yes L88 Pyoderma gangrenosum 03/26/2017 No Yes F17.208 Nicotine dependence, unspecified, with other nicotine-induced disorders 04/24/2020 No Yes Inactive Problems ICD-10 Code Description Active Date Inactive Date L97.213 Non-pressure chronic ulcer of right calf with necrosis of muscle 04/02/2017 04/02/2017 Resolved Problems ICD-10 Code Description Active Date Resolved Date L97.321 Non-pressure chronic ulcer of left ankle limited to breakdown of skin 10/08/2019 10/08/2019 L03.116 Cellulitis of left lower limb 05/24/2019 05/24/2019 Electronic Signature(s) Signed: 07/30/2021 9:05:59 AM By: Kalman Shan DO Entered By: Kalman Shan on 07/30/2021 08:44:44 Heidt, Yunis ([id number]) -------------------------------------------------------------------------------- Progress Note Details Patient Name: [name] Date of Service: 07/30/2021 8:00 AM Medical Record Number: [id number] Patient Account Number: [id number] Date of Birth/Sex: Jun 20, 1980 (40 y.o. M) Treating RN: Carlene Coria Primary Care Provider: [name] Other Clinician: Referring Provider: [name] Treating Provider/Extender: Yaakov Guthrie in Treatment: 242 Subjective Chief Complaint Information obtained from Patient He is here in follow up evaluation for LLE pyoderma ulcer History of Present Illness (HPI) 12/04/16; 41 year old man who  comes into the clinic today for review of a wound on the posterior left calf. He tells me that is been there for about a year. He is not a diabetic he does smoke half a pack per day. He was seen in the ER on 11/20/16 felt to have cellulitis around the wound and was given clindamycin. An x-ray did not show osteomyelitis. The patient initially tells me that he has a milk allergy that sets off a pruritic itching rash on his lower legs which she scratches incessantly and he thinks that's what may have set up the wound. He has been using various topical antibiotics and  ointments without any effect. He works in a [employer]ing [employer] and is on his feet all day. He does not have a prior history of wounds however he does have the rash on both lower legs the right arm and the ventral aspect of his left arm. These are excoriations and clearly have had scratching however there are of macular looking areas on both legs including a substantial larger area on the right leg. This does not have an underlying open area. There is no blistering. The patient tells me that 2 years ago in [place] in response to the rash on his legs he saw a dermatologist who told him he had a condition which may be pyoderma gangrenosum although I may be putting words into his mouth. He seemed to recognize this. On further questioning he admits to a 5 year history of quiesced. ulcerative colitis. He is not in any treatment for this. He's had no recent travel 12/11/16; the patient arrives today with his wound and roughly the same condition we've been using silver alginate this is a deep punched out wound with some surrounding erythema but no tenderness. Biopsy I did did not show confirmed pyoderma gangrenosum suggested nonspecific inflammation and vasculitis but does not provide an actual description of what was seen by the pathologist. I'm really not able to understand this We have also received information from the patient's dermatologist in [place] notes  from April 2016. This was a doctor Agarwal-antal. 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 He was prescribed topical steroid high potency under occlusion which helped but at this point the patient did not have a deep punched out wound. 12/18/16; the patient's wound is larger in terms of surface area however this surface looks better and there is less depth. The surrounding erythema also is better. The patient states that the wrap we put on came off 2 days ago when he has been using his compression stockings. He we are in the process of getting a dermatology consult. 12/26/16 on evaluation today patient's left lower extremity wound shows evidence of infection with surrounding erythema noted. He has been tolerating the dressing changes but states that he has noted more discomfort. There is a larger area of erythema surrounding the wound. No fevers, chills, nausea, or vomiting noted at this time. With that being said the wound still does have slough covering the surface. He is not allergic to any medication that he is aware of at this point. In regard to his right lower extremity he had several regions that are erythematous and pruritic he wonders if there's anything we can do to help that. 01/02/17 I reviewed patient's wound culture which was obtained his visit last week. He was placed on doxycycline at that point. Unfortunately that does not appear to be an antibiotic that would likely help with the situation however the pseudomonas noted on culture is sensitive to Cipro. Also unfortunately patient's wound seems to have a large compared to last week's evaluation. Not severely so but there are definitely increased measurements in general. He is continuing to have discomfort as well he writes this to be a seven out of 10. In fact he would prefer me not to perform any debridement today due to the fact that he is having discomfort and considering he has an active infection on the little  reluctant to do so anyway. No fevers, chills, nausea, or vomiting noted at this time. 01/08/17; patient seems dermatology on September 5. I suspect dermatology will want the  slides from the biopsy I did sent to their pathologist. I'm not sure if there is a way we can expedite that. In any case the culture I did before I left on vacation 3 weeks ago showed Pseudomonas he was given 10 days of Cipro and per her description of her intake nurses is actually somewhat better this week although the wound is quite a bit bigger than I remember the last time I saw this. He still has 3 more days of Cipro 01/21/17; dermatology appointment tomorrow. He has completed the ciprofloxacin for Pseudomonas. Surface of the wound looks better however he is had some deterioration in the lesions on his right leg. Meantime the left lateral leg wound we will continue with sample 01/29/17; patient had his dermatology appointment but I can't yet see that note. He is completed his antibiotics. The wound is more superficial but considerably larger in circumferential area than when he came in. This is in his left lateral calf. He also has swollen erythematous areas with superficial wounds on the right leg and small papular areas on both arms. There apparently areas in her his upper thighs and buttocks I did not look at those. Dermatology biopsied the right leg. Hopefully will have their input next week. 02/05/17; patient went back to see his dermatologist who told him that he had a "scratching problem" as well as staph. He is now on a 30 day course of doxycycline and I believe she gave him triamcinolone cream to the right leg areas to help with the itching [not exactly sure but probably triamcinolone]. She apparently looked at the left lateral leg wound although this was not rebiopsied and I think felt to be ultimately part of the same pathogenesis. He is using sample border foam and changing nevus himself. He now has a new open area on  the right posterior leg which was his biopsy site I don't have any of the dermatology notes 02/12/17; we put the patient in compression last week with SANTYL to the wound on the left leg and the biopsy. Edema is much better and the depth of the wound is now at level of skin. Area is still the same Biopsy site on the right lateral leg we've also been using santyl with a border foam dressing and he is changing this himself. 02/19/17; Using silver alginate started last week to both the substantial left leg wound and the biopsy site on the right wound. He is tolerating compression well. Has a an appointment with his primary M.D. tomorrow wondering about diuretics although I'm wondering if the edema problem is actually lymphedema RYZEN, DEADY ([id number]) 02/26/17; the patient has been to see his primary doctor Dr. Jerrel Ivory at [place] our primary care. She started him on Lasix 20 mg and this seems to have helped with the edema. However we are not making substantial change with the left lateral calf wound and inflammation. The biopsy site on the right leg also looks stable but not really all that different. 03/12/17; the patient has been to see vein and vascular Dr. Lucky Cowboy. He has had venous reflux studies I have not reviewed these. I did get a call from his dermatology office. They felt that he might have pathergy based on their biopsy on his right leg which led them to look at the slides of the biopsy I did on the left leg and they wonder whether this represents pyoderma gangrenosum which was the original supposition in a man with ulcerative colitis albeit inactive for  many years. They therefore recommended clobetasol and tetracycline i.e. aggressive treatment for possible pyoderma gangrenosum. 03/26/17; apparently the patient just had reflux studies not an appointment with Dr. dew. She arrives in clinic today having applied clobetasol for 2-3 weeks. He notes over the last 2-3 days excessive drainage  having to change the dressing 3-4 times a day and also expanding erythema. 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 Lillia Carmel at [facility] dermatology locally who kindly saw him at my request. A repeat biopsy apparently has confirmed pyoderma gangrenosum and he started on prednisone 60 mg yesterday. My concern was the degree of erythema medially extending from his left leg wound which was either inflammation from pyoderma or cellulitis. I put him on Augmentin however culture of the wound showed Pseudomonas which is quinolone sensitive. I really don't believe he has cellulitis however in view of everything I will continue and give him a course of Cipro. He is also on doxycycline as an immune modulator for the pyoderma. In addition to his original wound on the left lateral leg with surrounding erythema he has a wound on the right posterior calf which was an original biopsy site done by dermatology. This was felt to represent pathergy from pyoderma gangrenosum 04/16/17; pyoderma gangrenosum. Saw Dr. Lillia Carmel yesterday. He has been using topical antibiotics to both wound areas his original wound on the left and the biopsies/pathergy area on the right. There is definitely some improvement in the inflammation around the wound on the right although the patient states he has increasing sensitivity of the wounds. He is on prednisone 60 and doxycycline 1 as prescribed by Dr. Lillia Carmel. He is covering the topical antibiotic with gauze and putting this in his own compression stocks and changing this daily. He states that Dr. Lottie Rater did a culture of the left leg wound yesterday 05/07/17; pyoderma gangrenosum. The patient saw Dr. Lillia Carmel yesterday and has a  follow-up with her in one month. He is still using topical antibiotics to both wounds although he can't recall exactly what type. He is still on prednisone 60 mg. Dr. Lillia Carmel stated that the doxycycline could stop if we were in agreement. He has been using his own compression stocks changing daily 06/11/17; pyoderma gangrenosum with wounds on the left lateral leg and right medial leg. The right medial leg was induced by biopsy/pathergy. The area on the right is essentially healed. Still on high-dose prednisone using topical antibiotics to the wound 07/09/17; pyoderma gangrenosum with wounds on the left lateral leg. The right medial leg has closed and remains closed. He is still on prednisone 60. He tells me he missed his last dermatology appointment with Dr. Lillia Carmel but will make another appointment. He reports that her blood sugar at a recent screen in [place] was high 200's. He was 180 today. 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 I think he is going to require still much longer prednisone perhaps another 3 months before attempting to taper. In the meantime his wound is a lot better. Smaller. He is cleaning this off daily and applying topical antibiotics. When he was last in the clinic I thought about changing to [facility] and actually put in a couple of calls to dermatology although probably not during their  business hours. 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 He continues on oral prednisone. He has been using triple antibiotic ointment. There is surface debris and we will transition to [facility] and have him return in 2 weeks. He has lost 30 pounds since his last appointment with lifestyle modification. He may benefit from topical steroid cream for treatment this can be considered at a later date. 08/22/17 on evaluation today patient appears to actually  be doing rather well in regard to his left lateral lower extremity ulcer. He has actually been managed by Dr. Dellia Nims most recently. Patient is currently on oral steroids at this time. This seems to have been of benefit for him. Nonetheless his last visit was actually with Leah on 08/06/17. Currently he is not utilizing any topical steroid creams although this could be of benefit as well. No fevers, chills, nausea, or vomiting noted at this time. 09/05/17 on evaluation today patient appears to be doing better in regard to his left lateral lower extremity ulcer. He has been tolerating the dressing changes without complication. He is using Santyl with good effect. Overall I'm very pleased with how things are standing at this point. Patient likewise is happy that this is doing better. 09/19/17 on evaluation today patient actually appears to be doing rather well in regard to his left lateral lower extremity ulcer. 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 He's not having any significant pain. 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 He has been tolerating the Santyl without complication and in general I feel like he's making good progress. 10/17/17 on evaluation today patient appears to be doing very well in regard to his left lateral lower surety ulcer. He has been tolerating the dressing changes without complication. There does not appear to be any evidence of infection he's alternating the Santyl and the triple antibiotic ointment every other day this seems to be doing well for him. 11/03/17 on evaluation today patient appears to be doing very well in regard to his left lateral lower extremity ulcer. He is been tolerating the dressing changes without complication which is good news. Fortunately there does not appear to be  any evidence of infection which is also great news. Overall is doing excellent they are starting to taper down on the prednisone is down to 40 mg at this point it also started topical clobetasol for him. 11/17/17 on evaluation today patient appears to be doing well in regard to his left lateral lower surety ulcer. He's been tolerating the dressing changes without complication. He does note that he is having no pain, no excessive drainage or discharge, and overall he feels like things are going about how he would expect and hope they would. Overall he seems to have no evidence of infection at this time in my opinion which is good news. 12/04/17-He is seen in follow-up evaluation for right lateral lower extremity ulcer. He has been applying topical steroid cream. Today's measurement show slight increase in size. Over the next 2 weeks we will transition to every other day Santyl and steroid cream. He has been encouraged to monitor for changes and notify clinic with any concerns 12/15/17 on evaluation today  patient's left lateral motion the ulcer and fortunately is doing worse again at this point. This just since last week to this week has close to doubled in size according to the patient. I did not seeing last week's I do not have a visual to compare this to in our system was also down so we do not have all the charts and at this point. Nonetheless it does have me somewhat concerned in regard to the fact that again he was worried enough about it he has contact the dermatology that placed them back on the full strength, 50 mg a day of the prednisone that he was taken previous. He continues to alternate using clobetasol along with Santyl at this point. He is obviously somewhat frustrated. HUBBERT, LANDRIGAN ([id number]) 12/22/17 on evaluation today patient appears to be doing a little worse compared to last evaluation. Unfortunately the wound is a little deeper and slightly larger than the last week's evaluation.  With that being said he has made some progress in regard to the irritation surrounding at this time unfortunately despite that progress that's been made he still has a significant issue going on here. 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 For that reason I discussed with him today the possibility of perform a wound culture to ensure there's nothing overtly infected. 01/06/18 on evaluation today patient's wound is larger and deeper than previously evaluated. With that being said it did appear that his wound was infected after my last evaluation with him. Subsequently I did end up prescribing a prescription for Bactrim DS which she has been taking and having no complication with. Fortunately there does not appear to be any evidence of infection at this point in time as far as anything spreading, no want to touch, and overall I feel like things are showing signs of improvement. 01/13/18 on evaluation today patient appears to be even a little larger and deeper than last time. There still muscle exposed in the base of the wound. Nonetheless he does appear to be less erythematous I do believe inflammation is calming down also believe the infection looks like it's probably resolved at this time based on what I'm seeing. No fevers, chills, nausea, or vomiting noted at this time. 01/30/18 on evaluation today patient actually appears to visually look better for the most part. Unfortunately those visually this looks better he does seem to potentially have what may be an abscess in the muscle that has been noted in the central portion of the wound. This is the first time that I have noted what appears to be fluctuance in the central portion of the muscle. With that being said I'm somewhat more concerned about the fact that this might indicate an abscess formation  at this location. I do believe that an ultrasound would be appropriate. This is likely something we need to try to do as soon as possible. He has been switch to mupirocin ointment and he is no longer using the steroid ointment as prescribed by dermatology he sees them again next week he's been decreased from 60 to 40 mg of prednisone. 03/09/18 on evaluation today patient actually appears to be doing a little better compared to last time I saw him. There's not as much erythema surrounding the wound itself. He I did review his most recent infectious disease note which was dated 02/24/18. He saw Dr.  Michel Bickers in [place]. 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 Patient is now off of antibiotics and they are continually observing this. There seems to be no change in the past two weeks in my pinion based on what the patient says and what I see today compared to what Dr. Megan Salon likely saw two weeks ago. No fevers, chills, nausea, or vomiting noted at this time. 03/23/18 on evaluation today patient's wound actually appears to be showing signs of improvement which is good news. He is currently still on the Dapsone. He is also working on tapering the prednisone to get off of this and Dr. Lottie Rater is working with him in this regard. Nonetheless overall I feel like the wound is doing well it does appear based on the infectious disease note that I reviewed from Dr. 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 I did also review the results of his ultrasound of left lower extremity which revealed there was a dentist tissue in the base of the wound without an abscess noted. 04/06/18 on evaluation today the patient's left lateral lower extremity ulcer actually appears to be doing fairly well which is excellent  news. There does not appear to be any evidence of infection at this time which is also great news. Overall he still does have a significantly large ulceration although little by little he seems to be making progress. He is down to 10 mg a day of the prednisone. 04/20/18 on evaluation today patient actually appears to be doing excellent at this time in regard to his left lower extremity ulcer. He's making signs of good progress unfortunately this is taking much longer than we would really like to see but nonetheless he is making progress. Fortunately there does not appear to be any evidence of infection at this time. No fevers, chills, nausea, or vomiting noted at this time. The patient has not been using the Santyl due to the cost he hadn't got in this field yet. He's mainly been using the antibiotic ointment topically. Subsequently he also tells me that he really has not been scrubbing in the shower I think this would be helpful again as I told him it doesn't have to be anything too aggressive to even make it believe just enough to keep it free of some of the loose slough and biofilm on the wound surface. 05/11/18 on evaluation today patient's wound appears to be making slow but sure progress in regard to the left lateral lower extremity ulcer. He is been tolerating the dressing changes without complication. Fortunately there does not appear to be any evidence of infection at this time. He is still just using triple antibiotic ointment along with clobetasol occasionally over the area. He never got the Santyl and really does not seem to intend to in my pinion. 06/01/18 on evaluation today patient appears to be doing a little better in regard to his left lateral lower extremity ulcer. He states that overall he does not feel like he is doing as well with the Dapsone as he did with the prednisone. Nonetheless he sees his dermatologist later today and is gonna talk to them about the possibility of going  back on the prednisone. Overall again I believe that the wound would be better if you would utilize  Santyl but he really does not seem to be interested in going back to the [place] at this point. He has been using triple antibiotic ointment. 06/15/18 on evaluation today patient's wound actually appears to be doing about the same at this point. Fortunately there is no signs of infection at this time. He has made slight improvements although he continues to not really want to clean the wound bed at this point. He states that he just doesn't mess with it he doesn't want to cause any problems with everything else he has going on. He has been on medication, antibiotics as prescribed by his dermatologist, for a staff infection of his lower extremities which is really drying out now and looking much better he tells me. Fortunately there is no sign of overall infection. 06/29/18 on evaluation today patient appears to be doing well in regard to his left lateral lower surety ulcer all things considering. Fortunately his staff infection seems to be greatly improved compared to previous. He has no signs of infection and this is drying up quite nicely. He is still the doxycycline for this is no longer on cental, Dapsone, or any of the other medications. His dermatologist has recommended possibility of an infusion but right now he does not want to proceed with that. 07/13/18 on evaluation today patient appears to be doing about the same in regard to his left lateral lower surety ulcer. Fortunately there's no signs of infection at this time which is great news. Unfortunately he still builds up a significant amount of Slough/biofilm of the surface of the wound he still is not really cleaning this as he should be appropriately. Again I'm able to easily with saline and gauze remove the majority of this on the surface which if you would do this at home would likely be a dramatic improvement for him as far as getting the area  to improve. Nonetheless overall I still feel like he is making progress is just very slow. I think Santyl will be of benefit for him as well. Still he has not gotten this as of this point. 07/27/18 on evaluation today patient actually appears to be doing little worse in regards of the erythema around the periwound region of the wound he also tells me that he's been having more drainage currently compared to what he was experiencing last time I saw him. He states not quite as bad as what he had because this was infected previously but nonetheless is still appears to be doing poorly. Fortunately there is no evidence of systemic infection at this point. The patient tells me that he is not going to be able to afford the Santyl. He is still waiting to hear about the infusion therapy with his dermatologist. Apparently she wants an updated colonoscopy first. VERMON, GRAYS ([id number]) 08/10/18 on evaluation today patient appears to be doing better in regard to his left lateral lower extremity ulcer. Fortunately he is showing signs of improvement in this regard he's actually been approved for Remicade infusion's as well although this has not been scheduled as of yet. Fortunately there's no signs of active infection at this time in regard to the wound although he is having some issues with infection of the right lower extremity is been seen as dermatologist for this. Fortunately they are definitely still working with him trying to keep things under control. 09/07/18 on evaluation today patient is actually doing rather well in regard to his left lateral lower extremity ulcer. He notes these actually having  some hair grow back on his extremity which is something he has not seen in years. He also tells me that the pain is really not giving them any trouble at this time which is also good news overall she is very pleased with the progress he's using a combination of the mupirocin along with the probate is all  mixed. 09/21/18 on evaluation today patient actually appears to be doing fairly well all things considered in regard to his looks from the ulcer. He's been tolerating the dressing changes without complication. Fortunately there's no signs of active infection at this time which is good news he is still on all antibiotics or prevention of the staff infection. He has been on prednisone for time although he states it is gonna contact his dermatologist and see if she put them on a short course due to some irritation that he has going on currently. Fortunately there's no evidence of any overall worsening this is going very slow I think cental would be something that would be helpful for him although he states that $50 for tube is quite expensive. He therefore is not willing to get that at this point. 10/06/18 on evaluation today patient actually appears to be doing decently well in regard to his left lateral leg ulcer. He's been tolerating the dressing changes without complication. Fortunately there's no signs of active infection at this time. Overall I'm actually rather pleased with the progress he's making although it's slow he doesn't show any signs of infection and he does seem to be making some improvement. I do believe that he may need a switch up and dressings to try to help this to heal more appropriately and quickly. 10/19/18 on evaluation today patient actually appears to be doing better in regard to his left lateral lower extremity ulcer. This is shown signs of having much less Slough buildup at this point due to the fact he has been using the [employer]. Obviously this is very good news. The overall size of the wound is not dramatically smaller but again the appearance is. 11/02/18 on evaluation today patient actually appears to be doing quite well in regard to his lower [place] ulcer. A lot of the skin around the ulcer is actually somewhat irritating at this point this seems to be more due to the  dressing causing irritation from the adhesive that anything else. Fortunately there is no signs of active infection at this time. 11/24/18 on evaluation today patient appears to be doing a little worse in regard to his overall appearance of his lower extremity ulcer. There's more erythema and warmth around the wound unfortunately. He is currently on doxycycline which he has been on for some time. With that being said I'm not sure that seems to be helping with what appears to possibly be an acute cellulitis with regard to his left lower extremity ulcer. No fevers, chills, nausea, or vomiting noted at this time. 12/08/18 on evaluation today patient's wounds actually appears to be doing significantly better compared to his last evaluation. He has been using Santyl along with alternating tripling about appointment as well as the steroid cream seems to be doing quite well and the wound is showing signs of improvement which is excellent news. Fortunately there's no evidence of infection and in fact his culture came back negative with only normal skin flora noted. 12/21/2018 upon evaluation today patient actually appears to be doing excellent with regard to his ulcer. This is actually the best that I have seen it  since have been helping to take care of him. It is both smaller as well as less slough noted on the surface of the wound and seems to be showing signs of good improvement with new skin growing from the edges. He has been using just the triamcinolone he does wonder if he can get a refill of that ointment today. 01/04/2019 upon evaluation today patient actually appears to be doing well with regard to his left lateral lower extremity ulcer. With that being said it does not appear to be that he is doing quite as well as last time as far as progression is concerned. There does not appear to be any signs of infection or significant irritation which is good news. With that being said I do believe that he may  benefit from switching to a collagen based dressing based on how clean The wound appears. 01/18/2019 on evaluation today patient actually appears to be doing well with regard to his wound on the left lower extremity. He is not made a lot of progress compared to where we were previous but nonetheless does seem to be doing okay at this time which is good news. There is no signs of active infection which is also good news. My only concern currently is I do wish we can get him into utilizing the collagen dressing his insurance would not pay for the supplies that we ordered although it appears that he may be able to order this through his supply company that he typically utilizes. This is [employer]. Nonetheless he did try to order it during the office visit today and it appears this did go through. We will see if he can get that it is a different brand but nonetheless he has collagen and I do think will be beneficial. 02/01/2019 on evaluation today patient actually appears to be doing a little worse today in regard to the overall size of his wounds. Fortunately there is no signs of active infection at this time. That is visually. Nonetheless when this is happened before it was due to infection. For that reason were somewhat concerned about that this time as well. 02/08/2019 on evaluation today patient unfortunately appears to be doing slightly worse with regard to his wound upon evaluation today. Is measuring a little deeper and a little larger unfortunately. I am not really sure exactly what is causing this to enlarge he actually did see his dermatologist she is going to see about initiating Humira for him. Subsequently she also did do steroid injections into the wound itself in the periphery. Nonetheless still nonetheless he seems to be getting a little bit larger he is gone back to just using the steroid cream topically which I think is appropriate. I would say hold off on the collagen for the time being is  definitely a good thing to do. Based on the culture results which we finally did get the final result back regarding it shows staph as the bacteria noted again that can be a normal skin bacteria based on the fact however he is having increased drainage and worsening of the wound measurement wise I would go ahead and place him on an antibiotic today I do believe for this. 02/15/2019 on evaluation today patient actually appears to be doing somewhat better in regard to his ulcer. There is no signs of worsening at this time I did review his culture results which showed evidence of Staphylococcus aureus but not MRSA. Again this could just be more related to the normal  skin bacteria although he states the drainage has slowed down quite a bit he may have had a mild infection not just colonization. And was much smaller and then since around10/04/2019 on evaluation today patient appears to be doing unfortunately worse as far as the size of the wound. I really feel like that this is steadily getting larger again it had been doing excellent right at the beginning of September we have seen a steady increase in the area of the wound it is almost 2-1/2 times the size it was on September 1. Obviously this is a bad trend this is not wanting to see. For that reason we went back to using just the topical triamcinolone cream which does seem to help with inflammation. I checked him for bacteria by way of culture and nothing showed positive there. I am considering giving him a short course of a tapering steroid Jarrell Armond, [place] ([id number]) today to see if that is can be beneficial for him. The patient is in agreement with giving that a try. 03/08/2019 on evaluation today patient appears to be doing very well in comparison to last evaluation with regard to his lower extremity ulcer. This is showing signs of less inflammation and actually measuring slightly smaller compared to last time every other week over the past  month and a half he has been measuring larger larger larger. Nonetheless I do believe that the issue has been inflammation the prednisone does seem to have been beneficial for him which is good news. No fevers, chills, nausea, vomiting, or diarrhea. 03/22/2019 on evaluation today patient appears to be doing about the same with regard to his leg ulcer. He has been tolerating the dressing changes without complication. With that being said the wound seems to be mostly arrested at its current size but really is not making any progress except for when we prescribed the prednisone. He did show some signs of dropping as far as the overall size of the wound during that interval week. Nonetheless this is something he is not on long-term at this point and unfortunately I think he is getting need either this or else the Humira which his dermatologist has discussed try to get approval for. With that being said he will be seeing his dermatologist on the 11th of this month that is November. 04/19/2019 on evaluation today patient appears to be doing really about the same the wound is measuring slightly larger compared to last time I saw him. He has not been into the office since November 2 due to the fact that he unfortunately had Covid as that his entire family. He tells me that it was rough but they did pull-through and he seems to be doing much better. Fortunately there is no signs of active infection at this time. No fevers, chills, nausea, vomiting, or diarrhea. 05/10/2019 on evaluation today patient unfortunately appears to be doing significantly worse as compared to last time I saw him. He does tell me that he has had his first dose of Humira and actually is scheduled to get the next one in the upcoming week. With that being said he tells me also that in the past several days he has been having a lot of issues with green drainage she showed me a picture this is more blue-green in color. He is also been having  issues with increased sloughy buildup and the wound does appear to be larger today. Obviously this is not the direction that we want everything to take based on  the starting of his Humira. Nonetheless I think this is definitely a result of likely infection and to be honest I think this is probably Pseudomonas causing the infection based on what I am seeing. 05/24/2019 on evaluation today patient unfortunately appears to be doing significantly worse compared to his prior evaluation with me 2 weeks ago. I did review his culture results which showed that he does have Staph aureus as well as Pseudomonas noted on the culture. Nonetheless the Levaquin that I prescribed for him does not appear to have been appropriate and in fact he tells me he is no longer experiencing the green drainage and discharge that he had at the last visit. Fortunately there is no signs of active infection at this time which is good news although the wound has significantly worsened it in fact is much deeper than it was previous. We have been utilizing up to this point triamcinolone ointment as the prescription topical of choice but at this time I really feel like that the wound is getting need to be packed in order to appropriately manage this due to the deeper nature of the wound. Therefore something along the lines of an alginate dressing may be more appropriate. 05/31/2019 upon inspection today patient's wound actually showed signs of doing poorly at this point. Unfortunately he just does not seem to be making any good progress despite what we have tried. He actually did go ahead and pick up the Cipro and start taking that as he was noticing more green drainage he had previously completed the Levaquin that I prescribed for him as well. Nonetheless he missed his appointment for the seventh last week on Wednesday with the wound care center and [facility] where his dermatologist referred him. Obviously I do think a second opinion  would be helpful at this point especially in light of the fact that the patient seems to be doing so poorly despite the fact that we have tried everything that I really know how at this point. The only thing that ever seems to have helped him in the past is when he was on high doses of continual steroids that did seem to make a difference for him. 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 06/07/2019 upon evaluation today patient unfortunately appears to be doing worse yet again with regard to his wound. In fact I am starting to question whether or not he may have a fluid pocket in the muscle at this point based on the bulging and the soft appearance to the central portion of the muscle area. There is not anything draining from the muscle itself at this time which is good news but nonetheless the wound is expanding. I am not really seeing any results of the Humira as far as overall wound progression based on what I am seeing at this point. The patient has been referred for second opinion with regard to his wound to the [facility] wound care center by his dermatologist which I definitely am not in opposition to. Unfortunately we tried multiple dressings in the past including collagen, alginate, and at one point even Hydrofera Blue. With that being said he is never really used it for any significant amount of time due to the fact that he often complains of pain associated with these dressings and then  will go back to either using the Santyl which she has done intermittently or more frequently the triamcinolone. He is also using his own compression stockings. We have wrapped him in the past but again that was something else that he really was not a big fan of. 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 He seemed to do much better when he was on the steroids although honestly I understand that the steroids are not necessarily the best medication to be on long-term obviously 06/14/2019 on evaluation today patient appears to be doing actually a little bit better with regard to the overall appearance with his leg. Unfortunately he does continue to have issues with what appears to be some fluid underneath the muscle although he did see the wound specialty center at [facility] last week their main goals were to see about infusion therapy in place of the Humira as they feel like that is not quite strong enough. They also recommended that we continue with the treatment otherwise as we are they felt like that was appropriate and they are okay with him continuing to follow-up here with [place] in that regard. With that being said they are also sending him to the vein specialist there to see about vein stripping and if that would be of benefit for him. Subsequently they also did not really address whether or not an ultrasound of the muscle area to see if there is anything that needs to be addressed here would be appropriate or not. For that reason I discussed this with him last week I think we may proceed down that road at this point. 06/21/2019 upon evaluation today patient's wound actually appears to be doing slightly better compared to previous evaluations. I do believe that he has made a difference with regard to the progression here with the use of oral steroids. Again in the past has been the only thing that is really calm things down. He does tell me that from [facility] is gotten a good news from there that there are no further vein stripping that is  necessary at this point. I do not have that available for review today although the patient did relay this to me. He also did obtain and have the ultrasound of the wound completed which I did sign off on today. It does appear that there is no fluid collection under the muscle this is likely then just edematous tissue in general. That is also good news. Overall I still believe the inflammation is the main issue here. He did inquire about the possibility of a wound VAC again with the muscle protruding like it is I am not really sure whether the wound VAC is necessarily ideal or not. That is something we will have to consider although I do believe he may need compression wrapping to try to help with edema control which could potentially be of benefit. 06/28/2019 on evaluation today patient appears to be doing slightly better measurement wise although this is not terribly smaller he least seems to be trending towards that direction. With that being said he still seems to have purulent drainage noted in the wound bed at  this time. He has been on Levaquin followed by Cipro over the past month. Unfortunately he still seems to have some issues with active infection at this time. I did perform a culture last week in order to evaluate and see if indeed there was still anything going on. Subsequently the culture did come back Newhart, Orell ([id number]) showing Pseudomonas which is consistent with the drainage has been having which is blue-green in color. He also has had an odor that again was somewhat consistent with Pseudomonas as well. Long story short it appears that the culture showed an intermediate finding with regard to how well the Cipro will work for the Pseudomonas infection. Subsequently being that he does not seem to be clearing up and at best what we are doing is just keeping this at [place] I think he may need to see infectious disease to discuss IV antibiotic options. 07/05/2019 upon evaluation today  patient appears to be doing okay in regard to his leg ulcer. He has been tolerating the dressing changes at this point without complication. Fortunately there is no signs of active infection at this time which is good news. No fevers, chills, nausea, vomiting, or diarrhea. With that being said he does have an appointment with infectious disease tomorrow and his primary care on Wednesday. Again the reason for the infectious disease referral was due to the fact that he did not seem to be fully resolving with the use of oral antibiotics and therefore we were thinking that IV antibiotic therapy may be necessary secondary to the fact that there was an intermediate finding for how effective the Cipro may be. Nonetheless again he has been having a lot of purulent and even green drainage. Fortunately right now that seems to have calmed down over the past week with the reinitiation of the oral antibiotic. Nonetheless we will see what Dr. Megan Salon has to say. 07/12/2019 upon evaluation today patient appears to be doing about the same at this point in regard to his left lower extremity ulcer. Fortunately there is no signs of active infection at this time which is good news I do believe the Levaquin has been beneficial I did review Dr. Hale Bogus note and to be honest I agree that the patient's leg does appear to be doing better currently. What we found in the past as he does not seem to really completely resolve he will stop the antibiotic and then subsequently things will revert back to having issues with blue-green drainage, increased pain, and overall worsening in general. Obviously that is the reason I sent him back to infectious disease. 07/19/2019 upon evaluation today patient appears to be doing roughly the same in size there is really no dramatic improvement. He has started back on the Levaquin at this point and though he seems to be doing okay he did still have a lot of blue/green drainage noted on  evaluation today unfortunately. I think that this is still indicative more likely of a Pseudomonas infection as previously noted and again he does see Dr. Megan Salon in just a couple of days. I do not know that were really able to effectively clear this with just oral antibiotics alone based on what I am seeing currently. Nonetheless we are still continue to try to manage as best we can with regard to the patient and his wound. I do think the wrap was helpful in decreasing the edema which is excellent news. No fevers, chills, nausea, vomiting, or diarrhea. 07/26/2019 upon evaluation today patient appears to  be doing slightly better with regard to the overall appearance of the muscle there is no dark discoloration centrally. Fortunately there is no signs of active infection at this time. No fevers, chills, nausea, vomiting, or diarrhea. Patient's wound bed currently the patient did have an appointment with Dr. Megan Salon at infectious disease last week. With that being said Dr. Megan Salon the patient states was still somewhat hesitant about put him on any IV antibiotics he wanted [place] to repeat cultures today and then see where things go going forward. He does look like Dr. Megan Salon because of some improvement the patient did have with the Levaquin wanted [place] to see about repeating cultures. If it indeed grows the Pseudomonas again then he recommended a possibility of considering a PICC line placement and IV antibiotic therapy. He plans to see the patient back in 1 to 2 weeks. 08/02/2019 upon evaluation today patient appears to be doing poorly with regard to his left lower extremity. We did get the results of his culture back it shows that he is still showing evidence of Pseudomonas which is consistent with the purulent/blue-green drainage that he has currently. Subsequently the culture also shows that he now is showing resistance to the oral fluoroquinolones which is unfortunate as that was really the only thing  to treat the infection prior. I do believe that he is looking like this is going require IV antibiotic therapy to get this under control. Fortunately there is no signs of systemic infection at this time which is good news. The patient does see Dr. Megan Salon tomorrow. 08/09/2019 upon evaluation today patient appears to be doing better with regard to his left lower extremity ulcer in regard to the overall appearance. He is currently on IV antibiotic therapy. As ordered by Dr. Megan Salon. Currently the patient is on ceftazidime which she is going to take for the next 2 weeks and then follow-up for 4 to 5-week appointment with Dr. Megan Salon. The patient started this this past Friday symptoms have not for a total of 3 days currently in full. 08/16/2019 upon evaluation today patient's wound actually does show muscle in the base of the wound but in general does appear to be much better as far as the overall evidence of infection is concerned. In fact I feel like this is for the most part cleared up he still on the IV antibiotics he has not completed the full course yet but I think he is doing much better which is excellent news. 08/23/2019 upon evaluation today patient appears to be doing about the same with regard to his wound at this point. He tells me that he still has pain unfortunately. Fortunately there is no evidence of systemic infection at this time which is great news. There is significant muscle protrusion. 09/13/19 upon evaluation today patient appears to be doing about the same in regard to his leg unfortunately. He still has a lot of drainage coming from the ulceration there is still muscle exposed. With that being said the patient's last wound culture still showed an intermediate finding with regard to the Pseudomonas he still having the bluish/green drainage as well. Overall I do not know that the wound has completely cleared of infection at this point. Fortunately there is no signs of active infection  systemically at this point which is good news. 09/20/2019 upon evaluation today patient's wound actually appears to be doing about the same based on what I am seeing currently. I do not see any signs of systemic infection he still  does have evidence of some local infection and drainage. He did see Dr. Megan Salon last week and Dr. Megan Salon states that he probably does need a different IV antibiotic although he does not want to put him on this until the patient begins the Remicade infusion which is actually scheduled for about 10 days out from today on 13 May. Following that time Dr. Megan Salon is good to see him back and then will evaluate the feasibility of starting him on the IV antibiotic therapy once again at that point. I do not disagree with this plan I do believe as Dr. Megan Salon stated in his note that I reviewed today that the patient's issue is multifactorial with the pyoderma being 1 aspect of this that were hoping the Remicade will be helpful for her. In the meantime I think the gentamicin is, helping to keep things under decent okay control in regard to the ulcer. 09/27/2019 upon evaluation today patient appears to be doing about the same with regard to his wound still there is a lot of muscle exposure though he does have some hyper granulation tissue noted around the edge and actually some granulation tissue starting to form over the muscle which is actually good news. Fortunately there is no evidence of active infection which is also good news. His pain is less at this point. 5/21; this is a patient I have not seen in a long time. 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 He has a large wound on the left lateral leg with protruding muscle. He comes in the clinic today showing the same area on his left medial ankle. He says there is been a spot there for some time although we have not previously defined this. Today he has a clearly defined area with slight  amount of skin breakdown surrounded by raised areas with a purplish hue in color. This is not painful he says it is irritated. 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 He still has muscle protruding from the lateral portion of his left leg but fortunately the new area that they were concerned about at his last visit does not appear to have opened at this point. He is currently on Remicade infusions and seems to be doing better in my opinion in fact the wound itself seems to be overall much better. The purplish discoloration that he did have seems to have resolved and I think that is a good sign that hopefully the Remicade is doing its job. He does Florance, Jerrid ([id number]) have some biofilm noted over the surface of the wound. 11/01/2019 on evaluation today patient's wound actually appears to be doing excellent at this time. Fortunately there is no evidence of active infection and overall I feel like he is making great progress. The Remicade seems to be due excellent job in my opinion. 11/08/19 evaluation today vision actually appears to be doing quite well with regard to his weight ulcer. He's been tolerating dressing changes without complication. Fortunately there is no evidence of infection. No fevers, chills, nausea, or vomiting noted at this time. Overall states that is having more itching than pain which is actually a good sign in my opinion. 12/13/2019 upon evaluation today patient appears to be doing well today with regard to his wound. He has been tolerating the dressing changes without complication. Fortunately there is no sign of active infection at this time. No fevers, chills,  nausea, vomiting, or diarrhea. Overall I feel like the infusion therapy has been very beneficial for him. 01/06/2020 on evaluation today patient appears to be doing well with regard to his wound. This is  measuring smaller and actually looks to be doing better. Fortunately there is no signs of active infection at this point. No fevers, chills, nausea, vomiting, or diarrhea. With that being said he does still have the blue-green drainage but this does not seem to be causing any significant issues currently. He has been using the gentamicin that does seem to be keeping things under decent control at this point. He goes later this morning for his next infusion therapy for the pyoderma which seems to also be very beneficial. 02/07/2020 on evaluation today patient appears to be doing about the same in regard to his wounds currently. Fortunately there is no signs of active infection systemically he does still have evidence of local infection still using gentamicin. He also is showing some signs of improvement albeit slowly I do feel like we are making some progress here. 02/21/2020 upon evaluation today patient appears to be making some signs of improvement the wound is measuring a little bit smaller which is great news and overall I am very pleased with where he stands currently. He is going to be having infusion therapy treatment on the 15th of this month. Fortunately there is no signs of active infection at this time. 03/13/2020 I do believe patient's wound is actually showing some signs of improvement here which is great news. He has continue with the infusion therapy through rheumatology/dermatology at [facility]. That does seem to be beneficial. I still think he gets as much benefit from this as he did from the prednisone initially but nonetheless obviously this is less harsh on his body that the prednisone as far as they are concerned. 03/31/2020 on evaluation today patient's wound actually showing signs of some pretty good improvement in regard to the overall appearance of the wound bed. There is still muscle exposed though he does have some epithelial growth around the edges of the wound. Fortunately there  is no signs of active infection at this time. No fevers, chills, nausea, vomiting, or diarrhea. 04/24/2020 upon evaluation today patient appears to be doing about the same in regard to his leg ulcer. He has been tolerating the dressing changes without complication. Fortunately there is no signs of active infection at this time. No fevers, chills, nausea, vomiting, or diarrhea. With that being said he still has a lot of irritation from the bandaging around the edges of the wound. We did discuss today the possibility of a referral to plastic surgery. 05/22/2020 on evaluation today patient appears to be doing well with regard to his wounds all things considered. He has not been able to get the Chantix apparently there is a recall nurse that I was unaware of put out by [employer] involuntarily. Nonetheless for now I am and I have to do some research into what may be the best option for him to help with quitting in regard to smoking and we discussed that today. 06/26/2020 upon evaluation today patient appears to be doing well with regard to his wound from the standpoint of infection I do not see any signs of infection at this point. With that being said unfortunately he is still continuing to have issues with muscle exposure and again he is not having a whole lot of new skin growth unfortunately. There does not appear to be any signs of active  infection at this time. No fevers, chills, nausea, vomiting, or diarrhea. 07/10/2020 upon evaluation today patient appears to be doing a little bit more poorly currently compared to where he was previous. I am concerned currently about an active infection that may be getting worse especially in light of the increased size and tenderness of the wound bed. No fevers, chills, nausea, vomiting, or diarrhea. 07/24/2020 upon evaluation today patient appears to be doing poorly in regard to his leg ulcer. He has been tolerating the dressing changes without complication but  unfortunately is having a lot of discomfort. 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 Subsequently I think he is going require possibly IV antibiotics to get this under control. I am very concerned about the severity of his infection and the amount of discomfort he is having. 07/31/2020 upon evaluation today patient appears to be doing about the same in regard to his leg wound. He did see Dr. Megan Salon and Dr. Megan Salon is actually going to start him on IV antibiotics. He goes for the PICC line tomorrow. With that being said there do not have that run for 2 weeks and then see how things are doing and depending on how he is progressing they may extend that a little longer. Nonetheless I am glad this is getting ready to be in place and definitely feel it may help the patient. In the meantime is been using mainly triamcinolone to the wound bed has an anti-inflammatory. 08/07/2020 on evaluation today patient appears to be doing well with regard to his wound compared even last week. In the interim he has gotten the PICC line placed and overall this seems to be doing excellent. There does not appear to be any evidence of infection which is great news systemically although locally of course has had the infection this appears to be improving with the use of the antibiotics. 08/14/2020 upon evaluation today patient's wound actually showing signs of excellent improvement. Overall the irritation has significantly improved the drainage is back down to more of a normal level and his pain is really pretty much nonexistent compared to what it was. Obviously I think that this is significantly improved secondary to the IV antibiotic therapy which has made all the difference in the world. Again he had a resistant form of Pseudomonas for which oral antibiotics just was not cutting it. Nonetheless I do think that still we need to consider the  possibility of a surgical closure for this wound is been open so long and to be honest with muscle exposed I think this can be very hard to get this to close outside of this although definitely were still working to try to do what we can in that regard. 08/21/2020 upon evaluation today patient appears to be doing very well with regard to his wounds on the left lateral lower extremity/calf area. Fortunately there does not appear to be signs of active infection which is great news and overall very pleased with where things stand today. He is actually wrapping up his treatment with IV antibiotics tomorrow. After that we will see where things go from there. 08/28/2020 upon evaluation today patient appears to be doing decently well with regard to his leg ulcer. There does not appear to be any signs of Okazaki, Savier ([id number]) active infection which is great news and overall very pleased with where things stand today. No fevers, chills, nausea, vomiting, or diarrhea. 09/18/2020 upon evaluation today patient appears to be  doing well with regard to his infection which I feel like is better. Unfortunately he is not doing as well with regard to the overall size of the wound which is not nearly as good at this point. I feel like that he may be having an issue here with the pyoderma being somewhat out of control. I think that he may benefit from potentially going back and talking to the dermatologist about what to do from the pyoderma standpoint. I am not certain if the infusions are helping nearly as much is what the prednisone did in the past. 10/02/2020 upon evaluation today patient appears to be doing well with regard to his leg ulcer. He did go to the Psychiatric nurse. Unfortunately they feel like there is a 10% chance that most that he would be able to heal and that the skin graft would take. Obviously this has led him to not be able to go down that path as far as treatment is concerned. Nonetheless he does seem  to be doing a little bit better with the prednisone that I gave him last time. I think that he may need to discuss with dermatology the possibility of long-term prednisone as that seems to be what is most helpful for him to be perfectly honest. I am not sure the Remicade is really doing the job. 10/17/2020 upon evaluation today patient appears to be doing a little better in regard to his wound. In fact the case has been since we did the prednisone on May 2 for him that we have noticed a little bit of improvement each time we have seen a size wise as well as appearance wise as well as pain wise. I think the prednisone has had a greater effect then the infusion therapy has to be perfectly honest. With that being said the patient also feels significantly better compared to what he was previous. All of this is good news but nonetheless I am still concerned about the fact that again we are really not set up to long-term manage him as far as prednisone is concerned. Obviously there are things that you need to be watched I completely understand the risk of prednisone usage as well. 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 With all that being said I do believe that we can give him another round of the prednisone which she is requesting today because of the improvement that he seen since we did that first round. 10/30/2020 upon evaluation today patient's wound actually is showing signs of doing quite well. There does not appear to be any evidence of infection which is great news and overall very pleased with where things stand today. No fevers, chills, nausea, vomiting, or diarrhea. He tells me that the prednisone still has seem to have helped he wonders if we can extend that for just a little bit longer. He did not have the appointment with a dermatologist although he did have an infusion appointment last Friday. That was at [facility]. With that being said he tells me  he could not do both that as well as the appointment with the physician on the same day therefore that is can have to be rescheduled. I really want to see if there is anything they feel like that could be done differently to try to help this out as I am not really certain that the infusions are helping significantly here. 11/13/2020 upon evaluation today patient unfortunately appears to be  doing somewhat poorly in regard to his wound I feel like this is actually worsening from the standpoint of the pyoderma spreading. I still feel like that he may need something different as far as trying to manage this going forward. Again we did the prednisone unfortunately his blood sugars are not doing so well because of this. Nonetheless I believe that the patient likely needs to try topical steroid. We have done triamcinolone for a while I think going with something stronger such as clobetasol could be beneficial again this is not something I do lightly I discussed this with the patient that again this does not normally put underneath an occlusive dressing. Nonetheless I think a thin film as such could help with some of the stronger anti-inflammatory effects. We discussed this today. He would like to try to give this a trial for the next couple weeks. I definitely think that is something that we can do. Evaluate7/03/2021 and today patient's wound bed actually showed signs of doing really about the same. There was a little expansion of the size of the wound and that leading edge that we done looking out although the clobetasol does seem to have slowed this down a bit in my opinion. There is just 1 small area that still seems to be progressing based on what I see. Nonetheless I am concerned about the fact this does not seem to be improving if anything seems to be doing a little bit worse. I do not know that the infusions are really helping him much as next infusion is August 5 his appointment with dermatology is  July 25. Either way I really think that we need to have a conversation potentially about this and I am actually going to see if I can talk with Dr. Lillia Carmel in order to see where things stand as well. 12/11/2020 upon evaluation today patient appears to be doing worse in regard to his leg ulcer. Unfortunately I just do not think this is making the progress that I would like to see at this point. Honestly he does have an appointment with dermatology and this is in 2 days. I am wondering what they may have to offer to help with this. Right now what I am seeing is that he is continuing to show signs of worsening little by little. Obviously that is not great at all. Is the exact opposite of what we are looking for. 12/18/2020 upon evaluation today patient appears to be doing a little better in regard to his wound. The dermatologist actually did do some steroid injections into the wound which does seem to have been beneficial in my opinion. That was on the 25th already this looks a little better to me than last time I saw him. With that being said we did do a culture and this did show that he has Staph aureus noted in abundance in the wound. With that being said I do think that getting him on an oral antibiotic would be appropriate as well. Also think we can compression wrap and this will make a difference as well. 12/28/2020 upon evaluation today patient's wound is actually showing signs of doing much better. I do believe the compression wrap is helping he has a lot of drainage but to be honest I think that the compression is helping to some degree in this regard as well as not draining through which is also good news. No fevers, chills, nausea, vomiting, or diarrhea. 01/04/2021 upon evaluation today patient appears to be doing  well with regard to his wound. Overall things seem to be doing quite well. He did have a little bit of reaction to the CarboFlex Sorbact he will be using that any longer. With that  being said he is controlled as far as the drainage is concerned overall and seems to be doing quite well. I do not see any signs of active infection at this time which is great news. No fevers, chills, nausea, vomiting, or diarrhea. 01/11/2021 upon evaluation today patient appears to be doing well with regard to his wounds. He has been tolerating the dressing changes without complication. Fortunately there does not appear to be any signs of active infection at this time which is great news. Overall I am extremely pleased with where we stand currently. No fevers, chills, nausea, vomiting, or diarrhea. Where using clobetasol in the wound bed he has a lot of new skin growth which is awesome as well. 01/18/2021 upon evaluation today patient appears to be doing very well in regard to his leg ulcer. He has been tolerating the dressing changes without complication. Fortunately there does not appear to be any signs of active infection which is great news. In general I think that he is making excellent progress 01/25/2021 upon evaluation today patient appears to be doing well with regard to his wound on the leg. I am actually extremely pleased with where things stand today. There does not appear to be any signs of active infection which is great news and overall I think that we are definitely headed in the appropriate direction based on what I am seeing currently. There does not appear to be any signs of active infection also excellent news. 02/06/2021 upon evaluation today patient appears to be doing well with regard to his wound. Overall visually this is showing signs of significant Knipfer, Prosper ([id number]) improvement which is great news. I do not see any signs of active infection systemically which is great even locally I do not think that we are seeing any major complications here. We did do fluorescence imaging with the [employer] DX today. The patient does have some odor and drainage noted and again this  is something that I think would benefit him to probably come more frequently for nurse visits. 02/19/2021 upon evaluation today patient actually appears to be doing quite well in regard to his wound. He has been tolerating the dressing changes without complication and overall I think that this is making excellent progress. I do not see any evidence of active infection at this point which is great news as well. No fevers, chills, nausea, vomiting, or diarrhea. 10/10; wound is made nice progress healthy granulation with a nice rim of epithelialization which seems to be expanding even from last week he has a deeper area in the inferior part of the more distal part of the wound with not quite as healthy as surface. This area will need to be followed. Using clobetasol and Hydrofera Blue 03/05/2021 upon evaluation today patient appears to be doing very well in regard to his leg ulcer. He has been tolerating dressing changes without complication. Fortunately there does not appear to be any signs of active infection which is great news and overall I am extremely pleased with where we stand currently. 03/12/2021 upon evaluation today patient appears to be doing well with regard to his wound in fact this is extremely extremely good based on what we are seeing today there does not appear to be any signs of active infection and overall I  think that he is doing awesome from the standpoint of healing in general. I am extremely pleased with how things seem to be progressing with regard to this pyoderma. Clobetasol has done wonders for him. I think the compression wrapping has also been of great benefit. 03/19/2021 upon evaluation today patient appears to be doing well with regard to his wound. He is tolerating the dressing changes without complication. In fact I feel like that he is actually making excellent progress at this point based on what I am seeing. No fevers, chills, nausea, vomiting, or  diarrhea. 03/26/2021 upon evaluation today patient appears to be doing well with regard to his wound. This again is measuring smaller and looking better. Again the progress is slow but nonetheless continual with what we have been seeing. I do believe that the current plan is doing awesome for him. 04/09/2021 upon evaluation today patient appears to be doing well with regard to his leg ulcer. This is showing signs of excellent improvement the muscle is completely closed over and there does not appear to be any evidence of inflammation at this point his drainage is significantly improved. Overall I think that he would be a good candidate for looking into a skin substitute at this point as well. We will get a look into some approvals in that regard. Potentially TheraSkin as well as Apligraf could both be considered just depending on insurance coverage. 04/16/2021 upon evaluation today patient appears to be doing well at this point. He has been approved for the Apligraf which we could definitely order although I would like to try to get the TheraSkin approved if at all possible. I did fax notes into them today and I Georgina Peer try to give a call as well. Overall the wound appears to be doing decently well today. 04/20/2021 upon evaluation today patient actually appears to be doing quite well in regard to his wound with that being said we are trying to see what we can do about speeding up the healing process. For that reason we did discuss the possibility of a skin substitute. We got the Apligraf approved. For that reason we will get a go ahead and see what we can do with the Apligraf at this point. I am still trying to get the TheraSkin approved but I have not heard anything from the insurance company yet I have called and talked to them earlier in the week and to be honest this was about half an hour that I spent on the phone they told me I would hear something within 10-15 business days. 04/27/2021 upon  evaluation today patient appears to be doing excellent in regard to his wound. I do believe the Apligraf has been beneficial. With that being said he has had a little bit of increased pain has been a little bit concerned. I do want to go ahead and apply his steroid cream today the clobetasol and then put the Apligraf over I just do not think I want to risk not putting the clobetasol on based on what is been going on here currently. Patient voiced understanding. 05/04/2021 upon evaluation today patient is making excellent improvement. Overall there is definitely decrease in the size of the wound today and I am very pleased in that regard. I do not see any signs of active infection locally nor systemically at this point. No fevers, chills, nausea, vomiting, or diarrhea. 05/11/2021 upon evaluation today patient appears to be doing well with regard to his wound. He has been tolerating  Apligraf's without complication and is making excellent progress this is application #4 today. 12/30; patient here for Apligraf application. Appears to be doing well small wound there has been a lot of healing 05/28/2021 upon evaluation today patient appears to be doing well with regard to his wound. Has been tolerating the dressing changes without complication. Fortunately I do not see any signs of active infection locally nor systemically at this point which is great news. He is done with the Apligraf and the first round and overall this has filled in quite nicely I am not even certain he needs any additional at the moment. I would actually try to go back to clobetasol with [facility] which previously was doing well for him. 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 He is using clobetasol and Hydrofera Blue. Surface area of the wound is smaller and the surface looks very healthy. 06/11/2021 upon evaluation today patient actually appears to be showing signs of good  improvement which is great news. Fortunately I do not see any signs of active infection at this time which is also excellent news. I do believe that the patient is doing well with the clobetasol and Hydrofera Blue. He does have some irritation and itching around the rest of the leg will use some triamcinolone over this area. 06/18/2021 upon evaluation today patient appears to be doing well with regard to his wound. He is showing signs of excellent improvement and overall I am extremely pleased with where we stand today. We are moving in the right direction. 06/25/2021 upon evaluation today patient appears to be doing well with regard to his wound. In fact this is doing also not showing signs of excellent improvement overall very pleased with where we stand today. I do not see any evidence of active infection locally or systemically at this time which is also great news. 07/02/2021 upon evaluation patient's wound bed actually showed signs of good granulation and epithelization at this point. And this is measuring significantly smaller and overall I am extremely pleased with where we stand today. No fevers, chills, nausea, vomiting, or diarrhea. 07/09/2021 upon evaluation patient's wound bed actually showed signs of good granulation and epithelization at this point. Fortunately there does not appear to be any evidence of active infection locally or systemically which is great news and overall I am extremely pleased with where we LAMEL, MCCARLEY ([id number]) stand at this point. 07/16/2021 upon evaluation today patient appears to be doing well with regard to his wound all things considered although it is maybe a little bit larger than what its been. This does have me a little concerned. Actually question about whether anything is changed and initially he told me know. In fact it was not until the end of the visit that he actually did mention that he had missed his infusion in January. This very well may be what  is because the difference is also seeing currently. That definitely has seem to been helping more recently. 07/23/2021 upon evaluation today patient's wound is yet again larger despite the switch to a collagen which was not beneficial. Subsequently I am going to at this point check on a couple things. First and foremost I do want to go ahead and do a culture. I think that there is a chance he could have a low-lying infection that may be part of the issue here. Subsequently I am also probably can go ahead and place him on Bactrim DS which I  think is a good option and has done well with them in the past. Again if this is an infection that should hopefully help to turn things around. With all that being said I do believe that the patient should hopefully be able to turn this around with reinitiation of the infusion therapy which will be going on this week and subsequently getting on the antibiotic. 3/13; patient presents for follow-up. He has no issues or complaints today. He states he is currently on oral antibiotics for previous culture done in the office. Objective Constitutional Vitals Time Taken: 8:08 AM, Height: 71 in, Weight: 338 lbs, BMI: 47.1, Temperature: 98.3 F, Pulse: 79 bpm, Respiratory Rate: 18 breaths/min, Blood Pressure: 186/87 mmHg. General Notes: Left lower extremity: To the lateral aspect there is an open wound with granulation tissue present. No surrounding signs of infection. Integumentary (Hair, Skin) Wound #1 status is Open. Original cause of wound was Gradually Appeared. The date acquired was: 11/18/2015. The wound has been in treatment 242 weeks. The wound is located on the Left,Lateral Lower Leg. The wound measures 3.2cm length x 2cm width x 0.4cm depth; 5.027cm^2 area and 2.011cm^3 volume. There is Fat Layer (Subcutaneous Tissue) exposed. There is no tunneling or undermining noted. There is a medium amount of serosanguineous drainage noted. The wound margin is flat and  intact. There is large (67-100%) red, pink granulation within the wound bed. There is a small (1-33%) amount of necrotic tissue within the wound bed including Adherent Slough. 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 No surrounding signs of infection. 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 We will continue with current therapy of clobetasol and Hydrofera Blue under 4-layer compression. Procedures Wound #1 Pre-procedure diagnosis of Wound #1 is a Pyoderma located on the Left,Lateral Lower Leg . There was a Four Layer Compression Therapy Procedure by Carlene Coria, RN. Post procedure Diagnosis Wound #1: Same as Pre-Procedure RYLAN, BERNARD ([id number]) Plan Follow-up Appointments: Return Appointment in 1 week. Nurse Visit as needed - twice a week Bathing/ Shower/ Hygiene: Clean wound with Normal Saline or wound cleanser. May shower with wound dressing protected with water repellent cover or cast protector. Edema Control - Lymphedema / Segmental Compressive Device / Other: Optional: One layer of unna paste to top of compression wrap (to act as an anchor). Elevate, Exercise Daily and Avoid Standing for Long Periods of Time. Elevate legs to the level of the heart and pump ankles as often as possible Elevate leg(s) parallel to the floor when sitting. WOUND #1: - Lower Leg Wound Laterality: Left, Lateral Cleanser: Wound Cleanser 3 x Per Week/30 Days Discharge Instructions: Wash your hands with soap and water. Remove old dressing, discard into plastic bag and place into trash. Cleanse the wound with Wound Cleanser prior to applying a clean dressing using gauze sponges, not tissues or cotton balls. Do not  scrub or use excessive force. Pat dry using gauze sponges, not tissue or cotton balls. Topical: Clobetasol Propionate ointment 0.05%, 60 (g) tube 3 x Per Week/30 Days Discharge Instructions: Pt to bring to appt- Primary Dressing: Hydrofera Blue Ready Transfer Foam, 4x5 (in/in) 3 x Per Week/30 Days Discharge Instructions: Apply Hydrofera Blue Ready to wound bed as directed Secondary Dressing: (NON-BORDER) Zetuvit Plus Silicone NON-BORDER 5x5 (in/in) 3 x Per Week/30 Days Compression  Wrap: Medichoice 4 layer Compression System, 35-40 mmHG (Generic) 3 x Per Week/30 Days Discharge Instructions: Apply multi-layer wrap as directed. 1. Clobetasol and Hydrofera Blue under 4-layer compression 2. Follow-up in 1 week Electronic Signature(s) Signed: 07/30/2021 9:05:59 AM By: Kalman Shan DO Entered By: Kalman Shan on 07/30/2021 08:51:50 Mifflin, [name] ([id number]) -------------------------------------------------------------------------------- SuperBill Details Patient Name: [name] Date of Service: 07/30/2021 Medical Record Number: [id number] Patient Account Number: [id number] Date of Birth/Sex: [birth year] (40 y.o. M) Treating RN: Carlene Coria Primary Care Provider: [name] Other Clinician: Referring Provider: [name] Treating Provider/Extender: Yaakov Guthrie in Treatment: 242 Diagnosis Coding ICD-10 Codes Code Description I87.2 Venous insufficiency (chronic) (peripheral) L97.222 Non-pressure chronic ulcer of left calf with fat layer exposed E11.622 Type 2 diabetes mellitus with other skin ulcer L88 Pyoderma gangrenosum F17.208 Nicotine dependence, unspecified, with other nicotine-induced disorders Facility Procedures CPT4 Code: 59163846 Description: (Facility Use Only) [phone] - [place] LWR LT LEG Modifier: Quantity: 1 Physician Procedures CPT4 Code: 0177939 Description: 03009 - WC PHYS LEVEL 3 - EST PT Modifier: Quantity: 1 CPT4  Code: Description: ICD-10 Diagnosis Description L88 Pyoderma gangrenosum L97.222 Non-pressure chronic ulcer of left calf with fat layer exposed E11.622 Type 2 diabetes mellitus with other skin ulcer I87.2 Venous insufficiency (chronic) (peripheral) Modifier: Quantity: Electronic Signature(s) Signed: 07/30/2021 9:05:59 AM By: Kalman Shan DO Entered By: Kalman Shan on 07/30/2021 08:52:08

## 2021-07-30 NOTE — Progress Notes (Signed)
[name], [name] ([id number]) Visit Report for 07/30/2021 Arrival Information Details Patient Name: [name] Date of Service: 07/30/2021 8:00 AM Medical Record Number: [id number] Patient Account Number: [id number] Date of Birth/Sex: 1980-11-20 (41 y.o. M) Treating RN: Carlene Coria Primary Care Andrya Roppolo: [name] Other Clinician: Referring Raksha Wolfgang: [name] Treating Gatlyn Lipari/Extender: Yaakov Guthrie in Treatment: 76 Visit Information History Since Last Visit All ordered tests and consults were completed: No Patient Arrived: Ambulatory Added or deleted any medications: No Arrival Time: 08:04 Any new allergies or adverse reactions: No Accompanied By: self Had a fall or experienced change in No Transfer Assistance: None activities of daily living that may affect Patient Identification Verified: Yes risk of falls: Secondary Verification Process Completed: Yes Signs or symptoms of abuse/neglect since last visito No Patient Requires Transmission-Based No Hospitalized since last visit: No Precautions: Implantable device outside of the clinic excluding No Patient Has Alerts: Yes cellular tissue based products placed in the center Patient Alerts: Patient has reaction since last visit: to Has Dressing in Place as Prescribed: Yes silver dressings. Has Compression in Place as Prescribed: Yes Pain Present Now: No Electronic Signature(s) Signed: 07/30/2021 4:21:53 PM By: Carlene Coria RN Entered By: Carlene Coria on 07/30/2021 08:07:54 [name] ([id number]) -------------------------------------------------------------------------------- Clinic Level of Care Assessment Details Patient Name: [name], [name] Date of Service: 07/30/2021 8:00 AM Medical Record Number: [id number] Patient Account Number: [id number] Date of Birth/Sex: [birth year] (40 y.o. M) Treating RN: Carlene Coria Primary Care Lux Skilton: [name] Other Clinician: Referring Jasara Corrigan: [name] Treating Charmin Aguiniga/Extender: Yaakov Guthrie in Treatment: 242 Clinic Level of Care Assessment Items TOOL 1 Quantity Score _0  - Use when EandM and Procedure is performed on INITIAL visit 0 ASSESSMENTS - Nursing Assessment / Reassessment _1  - General Physical Exam (combine w/ comprehensive assessment (listed just below) when performed on new 0 pt. evals) _2  - 0 Comprehensive Assessment (HX, ROS, Risk Assessments, Wounds Hx, etc.) ASSESSMENTS - Wound and Skin Assessment / Reassessment _3  - Dermatologic / Skin Assessment (not related to wound area) 0 ASSESSMENTS - Ostomy and/or Continence Assessment and Care _4  - Incontinence Assessment and Management 0 _5  - 0 Ostomy Care Assessment and Management (repouching, etc.) PROCESS - Coordination of Care _6  - Simple Patient / Family Education for ongoing care 0 _7  - 0 Complex (extensive) Patient / Family Education for ongoing care _8  - 0 Staff obtains Programmer, systems, Records, Test Results / Process Orders _9  - 0 Staff telephones HHA, Nursing Homes / Clarify orders / etc _10  - 0 Routine Transfer to another Facility (non-emergent condition) _11  - 0 Routine Hospital Admission (non-emergent condition) _12  - 0 New Admissions / Biomedical engineer / Ordering NPWT, Apligraf, etc. _13  - 0 Emergency Hospital Admission (emergent condition) PROCESS - Special Needs _14  - Pediatric / Minor Patient Management 0 _15  - 0 Isolation Patient Management _16  - 0 Hearing / Language / Visual special needs _17  - 0 Assessment of Community assistance (transportation, D/C planning, etc.) _18  - 0 Additional assistance / Altered mentation _19  - 0 Support Surface(s) Assessment (bed, cushion, seat, etc.) INTERVENTIONS - Miscellaneous _20  - External ear exam 0 _21  - 0 Patient Transfer (multiple staff / Civil Service fast streamer / Similar devices) _22  - 0 Simple Staple / Suture removal (25 or less) _23  - 0 Complex Staple / Suture removal (26 or more) _24  -  0 Hypo/Hyperglycemic Management (do not check if billed separately) _25  - 0 Ankle / Brachial Index (ABI) - do not check if billed separately Has the patient been seen at the  hospital within the last three years: Yes Total Score: 0 Level Of Care: ____ [name] ([id number]) Electronic Signature(s) Signed: 07/30/2021 4:21:53 PM By: Carlene Coria RN Entered By: Carlene Coria on 07/30/2021 08:37:11 [name] ([id number]) -------------------------------------------------------------------------------- Compression Therapy Details Patient Name: [name] Date of Service: 07/30/2021 8:00 AM Medical Record Number: [id number] Patient Account Number: [id number] Date of Birth/Sex: 06/14/1980 (40 y.o. M) Treating RN: Carlene Coria Primary Care Chantal Worthey: [name] Other Clinician: Referring Satina Jerrell: [name] Treating Shariyah Eland/Extender: Yaakov Guthrie in Treatment: 242 Compression Therapy Performed for Wound Assessment: Wound #1 Left,Lateral Lower Leg Performed By: Clinician Carlene Coria, RN Compression Type: Four Layer Post Procedure Diagnosis Same as Pre-procedure Electronic Signature(s) Signed: 07/30/2021 4:21:53 PM By: Carlene Coria RN Entered By: Carlene Coria on 07/30/2021 08:34:57 [name] ([id number]) -------------------------------------------------------------------------------- Encounter Discharge Information Details Patient Name: [name] Date of Service: 07/30/2021 8:00 AM Medical Record Number: [id number] Patient Account Number: [id number] Date of Birth/Sex: 05/11/1981 (40 y.o. M) Treating RN: Carlene Coria Primary Care Martino Tompson: [name] Other Clinician: Referring Tron Flythe: [name] Treating Willamina Grieshop/Extender: Yaakov Guthrie in Treatment: [phone] Encounter Discharge Information Items Discharge Condition: Stable Ambulatory Status: Ambulatory Discharge Destination: Home Transportation: Private Auto Accompanied By:  self Schedule Follow-up Appointment: Yes Clinical Summary of Care: Patient Declined Electronic Signature(s) Signed: 07/30/2021 8:52:39 AM By: Carlene Coria RN Entered By: Carlene Coria on 07/30/2021 08:52:39 [name] ([id number]) -------------------------------------------------------------------------------- Lower Extremity Assessment Details Patient Name: [name] Date of Service: 07/30/2021 8:00 AM Medical Record Number: [id number] Patient Account Number: [id number] Date of Birth/Sex: [birth year] (40 y.o. M) Treating RN: Carlene Coria Primary Care Cuong Moorman: [name] Other Clinician: Referring Yeilin Zweber: [name] Treating Adri Schloss/Extender: Yaakov Guthrie in Treatment: 242 Edema Assessment Assessed: [Left: No] [Right: No] Edema: [Left: Ye] [Right: s] Calf Left: Right: Point of Measurement: 35 cm From Medial Instep 48 cm Ankle Left: Right: Point of Measurement: 10 cm From Medial Instep 28 cm Vascular Assessment Pulses: Dorsalis Pedis Palpable: [Left:Yes] Electronic Signature(s) Signed: 07/30/2021 4:21:53 PM By: Carlene Coria RN Entered By: Carlene Coria on 07/30/2021 08:16:53 [name] ([id number]) -------------------------------------------------------------------------------- Multi Wound Chart Details Patient Name: [name] Date of Service: 07/30/2021 8:00 AM Medical Record Number: [id number] Patient Account Number: [id number] Date of Birth/Sex: 03/10/1981 (40 y.o. M) Treating RN: Carlene Coria Primary Care Tara Rud: [name] Other Clinician: Referring Nelma Phagan: [name] Treating Allison Silva/Extender: Yaakov Guthrie in Treatment: 242 [name] Signs Height(in): 71 Pulse(bpm): 56 Weight(lbs): 338 Blood Pressure(mmHg): 186/87 Body Mass Index(BMI): 47.1 Temperature(F): 98.3 Respiratory Rate(breaths/min): 18 Photos: [N/A:N/A] Wound Location: Left, Lateral Lower Leg N/A N/A Wounding Event: Gradually Appeared N/A  N/A Primary Etiology: Pyoderma N/A N/A Comorbid History: Sleep Apnea, Hypertension, Colitis N/A N/A Date Acquired: 11/18/2015 N/A N/A Weeks of Treatment: 242 N/A N/A Wound Status: Open N/A N/A Wound Recurrence: No N/A N/A Measurements L x W x D (cm) 3.2x2x0.4 N/A N/A Area (cm) : 5.027 N/A N/A Volume (cm) : 2.011 N/A N/A % Reduction in Area: -2.40% N/A N/A % Reduction in Volume: 48.80% N/A N/A Classification: Full Thickness With Exposed N/A N/A Support Structures Exudate Amount: Medium N/A N/A Exudate Type: Serosanguineous N/A N/A Exudate Color: red, brown N/A N/A Wound Margin: Flat and Intact N/A N/A Granulation Amount: Large (67-100%) N/A N/A Granulation Quality: Red, Pink N/A N/A Necrotic Amount: Small (1-33%) N/A N/A Exposed Structures: Fat Layer (Subcutaneous Tissue): N/A N/A Yes Fascia: No Tendon: No Muscle: No Joint: No Bone: No Epithelialization: Medium (34-66%) N/A N/A Treatment Notes Electronic Signature(s) Signed: 07/30/2021 4:21:53 PM By:  Carlene Coria RN Entered By: Carlene Coria on 07/30/2021 08:17:58 [name], [name] ([id number]) -------------------------------------------------------------------------------- Multi-Disciplinary Care Plan Details Patient Name: [name], [name] Date of Service: 07/30/2021 8:00 AM Medical Record Number: [id number] Patient Account Number: [id number] Date of Birth/Sex: 1980-11-20 (40 y.o. M) Treating RN: Carlene Coria Primary Care Kenslei Hearty: [name] Other Clinician: Referring Alexandrea Westergard: [name] Treating Tayo Maute/Extender: Yaakov Guthrie in Treatment: [place] reviewed with physician Active Inactive Necrotic Tissue Nursing Diagnoses: Impaired tissue integrity related to necrotic/devitalized tissue Knowledge deficit related to management of necrotic/devitalized tissue Goals: Necrotic/devitalized tissue will be minimized in the wound bed Date Initiated: 03/26/2021 Target Resolution Date:  08/24/2021 Goal Status: Active Patient/caregiver will verbalize understanding of reason and process for debridement of necrotic tissue Date Initiated: 03/26/2021 Date Inactivated: 04/27/2021 Target Resolution Date: 03/26/2021 Goal Status: Met Interventions: Assess patient pain level pre-, during and post procedure and prior to discharge Provide education on necrotic tissue and debridement process Treatment Activities: Apply topical anesthetic as ordered : 03/26/2021 Notes: Electronic Signature(s) Signed: 07/30/2021 4:21:53 PM By: Carlene Coria RN Entered By: Carlene Coria on 07/30/2021 08:17:34 [name] ([id number]) -------------------------------------------------------------------------------- Pain Assessment Details Patient Name: [name] Date of Service: 07/30/2021 8:00 AM Medical Record Number: [id number] Patient Account Number: [id number] Date of Birth/Sex: 07/27/1980 (40 y.o. M) Treating RN: Carlene Coria Primary Care Joyia Riehle: [name] Other Clinician: Referring Dalaney Needle: [name] Treating Kenry Daubert/Extender: Yaakov Guthrie in Treatment: 242 Active Problems Location of Pain Severity and Description of Pain Patient Has Paino No Site Locations Pain Management and Medication Current Pain Management: Electronic Signature(s) Signed: 07/30/2021 4:21:53 PM By: Carlene Coria RN Entered By: Carlene Coria on 07/30/2021 08:08:37 [name] ([id number]) -------------------------------------------------------------------------------- Patient/Caregiver Education Details Patient Name: [name] Date of Service: 07/30/2021 8:00 AM Medical Record Number: [id number] Patient Account Number: [id number] Date of Birth/Gender: 03-Nov-1980 (40 y.o. M) Treating RN: Carlene Coria Primary Care Physician: [name] Other Clinician: Referring Physician: [name] Treating Physician/Extender: Yaakov Guthrie in Treatment: [id number] Education  Assessment Education Provided To: Patient Education Topics Provided Wound Debridement: Methods: Explain/Verbal Responses: State content correctly Electronic Signature(s) Signed: 07/30/2021 4:21:53 PM By: Carlene Coria RN Entered By: Carlene Coria on 07/30/2021 08:37:46 [name], [name] ([id number]) -------------------------------------------------------------------------------- Wound Assessment Details Patient Name: [name] Date of Service: 07/30/2021 8:00 AM Medical Record Number: [id number] Patient Account Number: [id number] Date of Birth/Sex: May 24, 1980 (40 y.o. M) Treating RN: Carlene Coria Primary Care Terree Gaultney: [name] Other Clinician: Referring Ashni Lonzo: [name] Treating Balin Vandegrift/Extender: Yaakov Guthrie in Treatment: 242 Wound Status Wound Number: 1 Primary Etiology: Pyoderma Wound Location: Left, Lateral Lower Leg Wound Status: Open Wounding Event: Gradually Appeared Comorbid History: Sleep Apnea, Hypertension, Colitis Date Acquired: 11/18/2015 Weeks Of Treatment: 242 Clustered Wound: No Photos Wound Measurements Length: (cm) 3.2 Width: (cm) 2 Depth: (cm) 0.4 Area: (cm) 5.027 Volume: (cm) 2.011 % Reduction in Area: -2.4% % Reduction in Volume: 48.8% Epithelialization: Medium (34-66%) Tunneling: No Undermining: No Wound Description Classification: Full Thickness With Exposed Support Structures Wound Margin: Flat and Intact Exudate Amount: Medium Exudate Type: Serosanguineous Exudate Color: red, brown Foul Odor After Cleansing: No Slough/Fibrino Yes Wound Bed Granulation Amount: Large (67-100%) Exposed Structure Granulation Quality: Red, Pink Fascia Exposed: No Necrotic Amount: Small (1-33%) Fat Layer (Subcutaneous Tissue) Exposed: Yes Necrotic Quality: Adherent Slough Tendon Exposed: No Muscle Exposed: No Joint Exposed: No Bone Exposed: No Treatment Notes Wound #1 (Lower Leg) Wound Laterality: Left, Lateral Cleanser Wound  Cleanser Discharge Instruction: Wash your hands with soap and water. Remove old dressing,  discard into plastic bag and place into trash. Cleanse the wound with Wound Cleanser prior to applying a clean dressing using gauze sponges, not tissues or cotton balls. Do not scrub or use excessive force. Pat dry using gauze sponges, not tissue or cotton balls. [name], [name] ([id number]) Peri-Wound Care Topical Clobetasol Propionate ointment 0.05%, 60 (g) tube Discharge Instruction: Pt to bring to appt- Primary Dressing Hydrofera Blue Ready Transfer Foam, 4x5 (in/in) Discharge Instruction: Apply Hydrofera Blue Ready to wound bed as directed Secondary Dressing (NON-BORDER) Zetuvit Plus Silicone NON-BORDER 5x5 (in/in) Secured With Compression Wrap Medichoice 4 layer Compression System, 35-40 mmHG Discharge Instruction: Apply multi-layer wrap as directed. Compression Stockings Add-Ons Electronic Signature(s) Signed: 07/30/2021 4:21:53 PM By: Carlene Coria RN Entered By: Carlene Coria on 07/30/2021 08:16:15 [name], [name] ([id number]) -------------------------------------------------------------------------------- Vitals Details Patient Name: [name] Date of Service: 07/30/2021 8:00 AM Medical Record Number: [id number] Patient Account Number: [id number] Date of Birth/Sex: 03/30/1981 (40 y.o. M) Treating RN: Carlene Coria Primary Care Coltin Casher: [name] Other Clinician: Referring Mickey Hebel: [name] Treating Tonya Wantz/Extender: Yaakov Guthrie in Treatment: 242 [name] Signs Time Taken: 08:08 Temperature (F): 98.3 Height (in): 71 Pulse (bpm): 79 Weight (lbs): 338 Respiratory Rate (breaths/min): 18 Body Mass Index (BMI): 47.1 Blood Pressure (mmHg): 186/87 Reference Range: 80 - 120 mg / dl Electronic Signature(s) Signed: 07/30/2021 4:21:53 PM By: Carlene Coria RN Entered By: Carlene Coria on 07/30/2021 63:87:56

## 2021-07-31 ENCOUNTER — Ambulatory Visit (INDEPENDENT_AMBULATORY_CARE_PROVIDER_SITE_OTHER): Payer: 59 | Admitting: Primary Care

## 2021-07-31 ENCOUNTER — Encounter: Payer: Self-pay | Admitting: Primary Care

## 2021-07-31 VITALS — BP 130/74 | HR 84 | Temp 98.0°F | Ht 71.0 in | Wt 350.0 lb

## 2021-07-31 DIAGNOSIS — Z794 Long term (current) use of insulin: Secondary | ICD-10-CM | POA: Diagnosis not present

## 2021-07-31 DIAGNOSIS — M79671 Pain in right foot: Secondary | ICD-10-CM | POA: Diagnosis not present

## 2021-07-31 DIAGNOSIS — E119 Type 2 diabetes mellitus without complications: Secondary | ICD-10-CM

## 2021-07-31 DIAGNOSIS — E1165 Type 2 diabetes mellitus with hyperglycemia: Secondary | ICD-10-CM

## 2021-07-31 HISTORY — DX: Pain in right foot: M79.671

## 2021-07-31 LAB — POCT GLYCOSYLATED HEMOGLOBIN (HGB A1C): Hemoglobin A1C: 7.4 % — AB (ref 4.0–5.6)

## 2021-07-31 MED ORDER — LANTUS SOLOSTAR 100 UNIT/ML ~~LOC~~ SOPN: 35.0000 [IU] | PEN_INJECTOR | Freq: Every day | SUBCUTANEOUS | 1 refills | Status: DC

## 2021-07-31 MED ORDER — TRULICITY 1.5 MG/0.5ML ~~LOC~~ SOAJ: 1.5000 mg | SUBCUTANEOUS | 0 refills | Status: DC

## 2021-07-31 NOTE — Assessment & Plan Note (Signed)
Likely secondary to his gait, discussed this today. ? ?We did also discuss comfortable shoes, extra insoles for support.  Offered podiatry evaluation, he kindly declines at this time but will monitor symptoms and notify if he changes his mind. ?

## 2021-07-31 NOTE — Progress Notes (Signed)
? ?Subjective:  ? ? Patient ID: [name], male    DOB: 1981-03-14, 41 y.o.   MRN: [id number] ? ?HPI ? ?[name] is a very pleasant 41 y.o. male with a history of uncontrolled type 2 diabetes, chronic skin ulcer, hyperlipidemia, tobacco abuse, depression who presents today for follow-up of diabetes. He would also like to discuss foot pain. ? ?1) Type 2 Diabetes: Current medications include: Metformin 1000 mg twice daily, Lantus units daily, Trulicity 9.62 mg weekly  ? ?He is checking his blood glucose 1 times daily and is getting readings ranging 120's-130's.  ? ?He drops down to the 40's once weekly, notices in the morning when waking, he does not wake from sleep.  ? ?Last A1C: 7.0 December 2022, 7.4 today ?Last Eye Exam: UTD ?Last Foot Exam: Due ?Pneumonia Vaccination:  ?Urine Microalbumin:  ?Statin: ? ?Dietary changes since last visit: None.  ? ? ?Exercise: Active at work.  ? ?2) Acute Foot Pain: Acute to the right lateral foot for the last 2 months. Worse with prolonged walking, improved with rest. Chronic skin ulcer to left lower extremity, denies left foot pain.   ? ?He's applied some natural cream with some improvement.  ? ?Review of Systems  ?Respiratory:  Negative for shortness of breath.   ?Cardiovascular:  Negative for chest pain.  ?Musculoskeletal:  Positive for arthralgias.  ?Neurological:  Negative for dizziness.  ? ?   ? ? ?Past Medical History:  ?Diagnosis Date  ? Blood in stool   ? Depression   ? Elevated blood pressure   ? Hyperlipidemia   ? OSA (obstructive sleep apnea) 2014  ? Seasonal allergies   ? Ulcerative colitis ([place]) 03/05/2018  ? ? ?Social History  ? ?Socioeconomic History  ? Marital status: Married  ?  Spouse name: Not on file  ? Number of children: Not on file  ? Years of education: Not on file  ? Highest education level: Not on file  ?Occupational History  ? Not on file  ?Tobacco Use  ? Smoking status: Every Day  ?  Packs/day: 1.00  ?  Years: 14.00  ?  Pack years: 14.00  ?  Types:  Cigarettes  ?  Last attempt to quit: 11/25/2018  ?  Years since quitting: 2.6  ? Smokeless tobacco: Former  ?  Quit date: 03/17/2015  ?Vaping Use  ? Vaping Use: Never used  ?Substance and Sexual Activity  ? Alcohol use: Not Currently  ?  Alcohol/week: 0.0 standard drinks  ?  Comment: soical  ? Drug use: Not on file  ? Sexual activity: Not on file  ?Other Topics Concern  ? Not on file  ?Social History Narrative  ? Married.  ? 7 kids.  ? 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  ? Enjoys target shooting.   ? ?Social Determinants of Health  ? ?Financial Resource Strain: Not on file  ?Food Insecurity: Not on file  ?Transportation Needs: Not on file  ?Physical Activity: Not on file  ?Stress: Not on file  ?Social Connections: Not on file  ?Intimate Partner Violence: Not on file  ? ? ?Past Surgical History:  ?Procedure Laterality Date  ? SEPTOPLASTY  2007  ? WRIST SURGERY    ? ? ?Family History  ?Problem Relation Age of Onset  ? Alcohol abuse Paternal Aunt   ? Alcohol abuse Paternal Uncle   ? Stroke Paternal Uncle   ? Hyperlipidemia Maternal Grandfather   ? Hypertension Maternal Grandfather   ? Diabetes Maternal  Grandfather   ? Alcohol abuse Maternal Grandfather   ? Multiple sclerosis Mother   ? Dementia Mother   ? ? ?Allergies  ?Allergen Reactions  ? Biaxin [Clarithromycin] Other (See Comments)  ?  Causes colitis flares  ? Milk-Related Compounds Hives  ? ? ?Current Outpatient Medications on File Prior to Visit  ?Medication Sig Dispense Refill  ? acetaminophen (TYLENOL) 500 MG tablet Take 1,000 mg by mouth every 6 (six) hours as needed for headache (pain).    ? atorvastatin (LIPITOR) 40 MG tablet Take 1 tablet (40 mg total) by mouth daily. for cholesterol. Office visit required for further refills. 90 tablet 0  ? blood glucose meter kit and supplies KIT Dispense based on patient and insurance preference. Use up to four times daily as directed. (FOR ICD-9 250.00, 250.01). 1 each 0  ? clobetasol ointment (TEMOVATE) 0.27 % Apply 1  application topically 2 (two) times daily.    ? Continuous Blood Gluc Receiver (DEXCOM G6 RECEIVER) DEVI 1 Device by Does not apply route continuous. 1 each 0  ? Continuous Blood Gluc Sensor (DEXCOM G6 SENSOR) MISC Apply 1 sensor into the skin every 10 days. 9 each 1  ? Continuous Blood Gluc Transmit (DEXCOM G6 TRANSMITTER) MISC 1 Device by Does not apply route continuous. 1 each 0  ? Dulaglutide (TRULICITY) 7.41 OI/7.8MV SOPN Inject 0.75 mg into the skin once a week. For diabetes. 6 mL 0  ? ibuprofen (ADVIL,MOTRIN) 200 MG tablet Take 400-600 mg by mouth every 6 (six) hours as needed for headache (pain).    ? inFLIXimab (REMICADE) 100 MG injection Inject into the muscle once a week.    ? Insulin Pen Needle (RELION PEN NEEDLES) 31G X 6 MM MISC USE 1  AT NIGHT WITH  INSULIN  AS  DIRECTED. 300 each 0  ? LANTUS SOLOSTAR 100 UNIT/ML Solostar Pen Inject 40 Units into the skin at bedtime. For diabetes. 45 mL 0  ? metFORMIN (GLUCOPHAGE) 1000 MG tablet TAKE 1 TABLET BY MOUTH TWICE DAILY WITH MEALS FOR DIABETES 180 tablet 3  ? venlafaxine XR (EFFEXOR-XR) 150 MG 24 hr capsule Take 1 capsule (150 mg total) by mouth daily with breakfast. For anxiety and depression. Office visit required for further refills. 90 capsule 0  ? buPROPion (WELLBUTRIN SR) 100 MG 12 hr tablet Take 1 tablet (100 mg total) by mouth 2 (two) times daily. For smoking (Patient not taking: Reported on 07/31/2021) 60 tablet 1  ? hydrocortisone 2.5 % ointment Apply topically. (Patient not taking: Reported on 07/31/2021)    ? ketoconazole (NIZORAL) 2 % cream Apply topically 2 (two) times daily. (Patient not taking: Reported on 07/31/2021)    ? mupirocin ointment (BACTROBAN) 2 % Apply topically two (2) 60g times a day. Mix with clobetasol and apply twice daily (Patient not taking: Reported on 07/31/2021)    ? SANTYL ointment Apply topically. (Patient not taking: Reported on 07/31/2021)    ? sulfamethoxazole-trimethoprim (BACTRIM DS) 800-160 MG tablet Take 1 tablet by  mouth 2 (two) times daily.    ? triamcinolone ointment (KENALOG) 0.1 % Apply topically. (Patient not taking: Reported on 07/31/2021)    ? ?No current facility-administered medications on file prior to visit.  ? ? ?BP 130/74   Pulse 84   Temp 98 ?F (36.7 ?C) (Oral)   Ht 5' 11"  (1.803 m)   Wt (!) 350 lb (158.8 kg)   SpO2 97%   BMI 48.82 kg/m?  ?Objective:  ? Physical Exam ?Cardiovascular:  ?  Rate and Rhythm: Normal rate and regular rhythm.  ?Pulmonary:  ?   Effort: Pulmonary effort is normal.  ?   Breath sounds: Normal breath sounds. No wheezing or rales.  ?Musculoskeletal:  ?   Cervical back: Neck supple.  ?Skin: ?   General: Skin is warm and dry.  ?Neurological:  ?   Mental Status: He is alert and oriented to person, place, and time.  ? ? ? ? ? ?   ?Assessment & Plan:  ? ? ? ? ?This visit occurred during the SARS-CoV-2 public health emergency.  Safety protocols were in place, including screening questions prior to the visit, additional usage of staff PPE, and extensive cleaning of exam room while observing appropriate contact time as indicated for disinfecting solutions.  ?

## 2021-07-31 NOTE — Patient Instructions (Signed)
We increase the dose of your Trulicity to 1.5 mg weekly, I sent a new prescription to your pharmacy. ? ?Continue metformin 1000 mg twice daily for diabetes. ? ?We reduce the dose of your Lantus to 35 units.  Consider moving your Lantus to morning. ? ?We will see you back in 3 months for follow-up, please notify me sooner if you continue to notice low blood sugars as discussed. ? ?It was a pleasure to see you today! ? ?

## 2021-07-31 NOTE — Addendum Note (Signed)
Addended by: Tammi Sou on: 07/31/2021 12:03 PM ? ? Modules accepted: Orders ? ?

## 2021-07-31 NOTE — Assessment & Plan Note (Signed)
Slightly worse, but overall under decent control.  Would like to see A1c below 7.0. ? ?He is experiencing hypoglycemic episodes once weekly, asymptomatic during episodes. ? ?Reduce Lantus to 35 units, discussed moving his Lantus to morning. ? ?Increase Trulicity to 1.5 mg weekly, new prescription sent to pharmacy. ?Continue metformin 1000 mg twice daily. ? ?We will plan to see him back in 3 months for follow-up. ?

## 2021-08-01 ENCOUNTER — Other Ambulatory Visit: Payer: Self-pay

## 2021-08-01 DIAGNOSIS — I872 Venous insufficiency (chronic) (peripheral): Secondary | ICD-10-CM | POA: Diagnosis not present

## 2021-08-01 NOTE — Progress Notes (Signed)
JAVIEN, TESCH ([id number]) ?Visit Report for 08/01/2021 ?Arrival Information Details ?Patient Name: [name], [name] ?Date of Service: 08/01/2021 8:00 AM ?Medical Record Number: [id number] ?Patient Account Number: [id number] ?Date of Birth/Sex: 02/19/1981 (41 y.o. M) ?Treating RN: Donnamarie Poag ?Primary Care Nimsi Males: [name] Other Clinician: ?Referring Mary Secord: [name] ?Treating Sion Reinders/Extender: Kalman Shan ?Weeks in Treatment: 243 ?Visit Information History Since Last Visit ?Added or deleted any medications: No ?Patient Arrived: Ambulatory ?Had a fall or experienced change in No ?Arrival Time: 08:00 ?activities of daily living that may affect ?Accompanied By: self ?risk of falls: ?Transfer Assistance: None ?Hospitalized since last visit: No ?Patient Identification Verified: Yes ?Has Dressing in Place as Prescribed: Yes ?Secondary Verification Process Completed: Yes ?Has Compression in Place as Prescribed: Yes ?Patient Requires Transmission-Based No ?Pain Present Now: No ?Precautions: ?Patient Has Alerts: Yes ?Patient Alerts: Patient has reaction ?to ?silver dressings. ?Electronic Signature(s) ?Signed: 08/01/2021 11:56:30 AM By: Donnamarie Poag ?Entered ByDonnamarie Poag on 08/01/2021 08:16:29 ?TIEN, SPOONER ([id number]) ?-------------------------------------------------------------------------------- ?Compression Therapy Details ?Patient Name: [name], [name] ?Date of Service: 08/01/2021 8:00 AM ?Medical Record Number: [id number] ?Patient Account Number: [id number] ?Date of Birth/Sex: [birth year] (41 y.o. M) ?Treating RN: Donnamarie Poag ?Primary Care Lizzeth Meder: [name] Other Clinician: ?Referring Kima Malenfant: [name] ?Treating Jeziah Kretschmer/Extender: Kalman Shan ?Weeks in Treatment: 243 ?Compression Therapy Performed for Wound Assessment: Wound #1 Left,Lateral Lower Leg ?Performed By: Clinician Donnamarie Poag, RN ?Compression Type: Four Layer ?Electronic Signature(s) ?Signed: 08/01/2021 11:56:30 AM By:  Donnamarie Poag ?Entered ByDonnamarie Poag on 08/01/2021 08:17:36 ?ACXEL, DINGEE ([id number]) ?-------------------------------------------------------------------------------- ?Encounter Discharge Information Details ?Patient Name: [name], [name] ?Date of Service: 08/01/2021 8:00 AM ?Medical Record Number: [id number] ?Patient Account Number: [id number] ?Date of Birth/Sex: [birth year] (41 y.o. M) ?Treating RN: Donnamarie Poag ?Primary Care Oluwafemi Villella: [name] Other Clinician: ?Referring Suliman Termini: [name] ?Treating Genise Strack/Extender: Kalman Shan ?Weeks in Treatment: 243 ?Encounter Discharge Information Items ?Discharge Condition: Stable ?Ambulatory Status: Ambulatory ?Discharge Destination: Home ?Transportation: Private Auto ?Accompanied By: self ?Schedule Follow-up Appointment: Yes ?Clinical Summary of Care: ?Electronic Signature(s) ?Signed: 08/01/2021 11:56:30 AM By: Donnamarie Poag ?Entered ByDonnamarie Poag on 08/01/2021 08:20:21 ?IGNATIUS, KLOOS ([id number]) ?-------------------------------------------------------------------------------- ?Wound Assessment Details ?Patient Name: [name], [name] ?Date of Service: 08/01/2021 8:00 AM ?Medical Record Number: [id number] ?Patient Account Number: [id number] ?Date of Birth/Sex: 1980/08/08 (41 y.o. M) ?Treating RN: Donnamarie Poag ?Primary Care Robt Okuda: [name] Other Clinician: ?Referring Jessica Checketts: [name] ?Treating Kelita Wallis/Extender: Kalman Shan ?Weeks in Treatment: 243 ?Wound Status ?Wound Number: 1 ?Primary Etiology: Pyoderma ?Wound Location: Left, Lateral Lower Leg ?Wound Status: Open ?Wounding Event: Gradually Appeared ?Comorbid History: Sleep Apnea, Hypertension, Colitis ?Date Acquired: 11/18/2015 ?Weeks Of Treatment: 243 ?Clustered Wound: No ?Photos ?Wound Measurements ?Length: (cm) 3.2 ?Width: (cm) 2 ?Depth: (cm) 0.4 ?Area: (cm?) 5.027 ?Volume: (cm?) 2.011 ?% Reduction in Area: -2.4% ?% Reduction in Volume: 48.8% ?Epithelialization: Medium (34-66%) ?Tunneling:  No ?Undermining: No ?Wound Description ?Classification: Full Thickness With Exposed Support Structures ?Wound Margin: Flat and Intact ?Exudate Amount: Medium ?Exudate Type: Serosanguineous ?Exudate Color: red, brown ?Foul Odor After Cleansing: No ?Slough/Fibrino Yes ?Wound Bed ?Granulation Amount: Large (67-100%) Exposed Structure ?Granulation Quality: Red, Pink ?Fascia Exposed: No ?Necrotic Amount: Small (1-33%) ?Fat Layer (Subcutaneous Tissue) Exposed: Yes ?Necrotic Quality: Adherent Slough ?Tendon Exposed: No ?Muscle Exposed: No ?Joint Exposed: No ?Bone Exposed: No ?Treatment Notes ?Wound #1 (Lower Leg) Wound Laterality: Left, Lateral ?Cleanser ?Wound Cleanser ?Discharge Instruction: Wash your hands with soap and water. Remove old dressing, discard into plastic bag and place into trash. ?Cleanse the wound with Wound Cleanser prior to applying a  clean dressing using gauze sponges, not tissues or cotton balls. Do ?not scrub or use excessive force. Pat dry using gauze sponges, not tissue or cotton balls. ?ISAHI, GODWIN ([id number]) ?Peri-Wound Care ?Desitin Maximum Strength Ointment 4 (oz) ?Discharge Instruction: Apply around wound edges to avoid maceration ?Topical ?Clobetasol Propionate ointment 0.05%, 60 (g) tube ?Discharge Instruction: Pt to bring to appt- ?Primary Dressing ?Hydrofera Blue Ready Transfer Foam, 4x5 (in/in) ?Discharge Instruction: Apply Hydrofera Blue Ready to wound bed as directed ?Secondary Dressing ?(NON-BORDER) Zetuvit Plus Silicone NON-BORDER 5x5 (in/in) ?Secured With ?Compression Wrap ?Medichoice 4 layer Compression System, 35-40 mmHG ?Discharge Instruction: Apply multi-layer wrap as directed. ?Compression Stockings ?Add-Ons ?Electronic Signature(s) ?Signed: 08/01/2021 11:56:30 AM By: Donnamarie Poag ?Entered ByDonnamarie Poag on 08/01/2021 08:17:06 ?

## 2021-08-01 NOTE — Progress Notes (Signed)
KOAH, CHISENHALL ([id number]) ?Visit Report for 08/01/2021 ?Physician Orders Details ?Patient Name: [name], [name] ?Date of Service: 08/01/2021 8:00 AM ?Medical Record Number: [id number] ?Patient Account Number: [id number] ?Date of Birth/Sex: 09-04-1980 (41 y.o. M) ?Treating RN: Donnamarie Poag ?Primary Care Provider: [name] Other Clinician: ?Referring Provider: [name] ?Treating Provider/Extender: Kalman Shan ?Weeks in Treatment: 243 ?Verbal / Phone Orders: No ?Diagnosis Coding ?Follow-up Appointments ?o Return Appointment in 1 week. ?o Nurse Visit as needed - twice a week ?Bathing/ Shower/ Hygiene ?o Clean wound with Normal Saline or wound cleanser. ?o May shower with wound dressing protected with water repellent cover or cast protector. ?Edema Control - Lymphedema / Segmental Compressive Device / Other ?o Optional: One layer of unna paste to top of compression wrap (to act as an anchor). ?o Elevate, Exercise Daily and Avoid Standing for Long Periods of Time. ?o Elevate legs to the level of the heart and pump ankles as often as possible ?o Elevate leg(s) parallel to the floor when sitting. ?Wound Treatment ?Wound #1 - Lower Leg Wound Laterality: Left, Lateral ?Cleanser: Wound Cleanser 3 x Per Week/30 Days ?Discharge Instructions: Wash your hands with soap and water. Remove old dressing, discard into plastic bag and place into trash. ?Cleanse the wound with Wound Cleanser prior to applying a clean dressing using gauze sponges, not tissues or cotton balls. Do not ?scrub or use excessive force. Pat dry using gauze sponges, not tissue or cotton balls. ?Peri-Wound Care: Desitin Maximum Strength Ointment 4 (oz) 3 x Per Week/30 Days ?Discharge Instructions: Apply around wound edges to avoid maceration ?Topical: Clobetasol Propionate ointment 0.05%, 60 (g) tube 3 x Per Week/30 Days ?Discharge Instructions: Pt to bring to appt- ?Primary Dressing: Hydrofera Blue Ready Transfer Foam, 4x5 (in/in) 3 x  Per Week/30 Days ?Discharge Instructions: Apply Hydrofera Blue Ready to wound bed as directed ?Secondary Dressing: (NON-BORDER) Zetuvit Plus Silicone NON-BORDER 5x5 (in/in) 3 x Per Week/30 Days ?Compression Wrap: Medichoice 4 layer Compression System, 35-40 mmHG (Generic) 3 x Per Week/30 Days ?Discharge Instructions: Apply multi-layer wrap as directed. ?Electronic Signature(s) ?Signed: 08/01/2021 9:32:24 AM By: Kalman Shan DO ?Signed: 08/01/2021 11:56:30 AM By: Donnamarie Poag ?Entered ByDonnamarie Poag on 08/01/2021 08:18:25 ?DRACEN, REIGLE ([id number]) ?-------------------------------------------------------------------------------- ?SuperBill Details ?Patient Name: [name] ?Date of Service: 08/01/2021 ?Medical Record Number: [id number] ?Patient Account Number: [id number] ?Date of Birth/Sex: 1980-06-07 (41 y.o. 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

## 2021-08-03 ENCOUNTER — Other Ambulatory Visit: Payer: Self-pay

## 2021-08-03 DIAGNOSIS — I872 Venous insufficiency (chronic) (peripheral): Secondary | ICD-10-CM | POA: Diagnosis not present

## 2021-08-03 NOTE — Progress Notes (Signed)
[name], [name] ([id number]) ?Visit Report for 08/03/2021 ?Physician Orders Details ?Patient Name: [name], [name] ?Date of Service: 08/03/2021 8:00 AM ?Medical Record Number: [id number] ?Patient Account Number: [id number] ?Date of Birth/Sex: [birth year] (41 y.o. M) ?Treating RN: Donnamarie Poag ?Primary Care Provider: [name] Other Clinician: ?Referring Provider: [name] ?Treating Provider/Extender: Jeri Cos ?Weeks in Treatment: 243 ?Verbal / Phone Orders: No ?Diagnosis Coding ?Follow-up Appointments ?o Return Appointment in 1 week. ?o Nurse Visit as needed - twice a week ?Bathing/ Shower/ Hygiene ?o Clean wound with Normal Saline or wound cleanser. ?o May shower with wound dressing protected with water repellent cover or cast protector. ?Edema Control - Lymphedema / Segmental Compressive Device / Other ?o Optional: One layer of unna paste to top of compression wrap (to act as an anchor). ?o Elevate, Exercise Daily and Avoid Standing for Long Periods of Time. ?o Elevate legs to the level of the heart and pump ankles as often as possible ?o Elevate leg(s) parallel to the floor when sitting. ?Wound Treatment ?Wound #1 - Lower Leg Wound Laterality: Left, Lateral ?Cleanser: Wound Cleanser 3 x Per Week/30 Days ?Discharge Instructions: Wash your hands with soap and water. Remove old dressing, discard into plastic bag and place into trash. ?Cleanse the wound with Wound Cleanser prior to applying a clean dressing using gauze sponges, not tissues or cotton balls. Do not ?scrub or use excessive force. Pat dry using gauze sponges, not tissue or cotton balls. ?Topical: Clobetasol Propionate ointment 0.05%, 60 (g) tube 3 x Per Week/30 Days ?Discharge Instructions: Pt to bring to appt- ?Primary Dressing: Hydrofera Blue Ready Transfer Foam, 4x5 (in/in) 3 x Per Week/30 Days ?Discharge Instructions: Apply Hydrofera Blue Ready to wound bed as directed ?Secondary Dressing: (NON-BORDER) Zetuvit Plus Silicone  NON-BORDER 5x5 (in/in) 3 x Per Week/30 Days ?Compression Wrap: Medichoice 4 layer Compression System, 35-40 mmHG (Generic) 3 x Per Week/30 Days ?Discharge Instructions: Apply multi-layer wrap as directed. ?Electronic Signature(s) ?Signed: 08/03/2021 4:01:57 PM By: Donnamarie Poag ?Signed: 08/03/2021 5:45:40 PM By: Worthy Keeler PA-C ?Entered ByDonnamarie Poag on 08/03/2021 08:20:32 ?[name], [name] ([id number]) ?-------------------------------------------------------------------------------- ?SuperBill Details ?Patient Name: [name], [name] ?Date of Service: 08/03/2021 ?Medical Record Number: [id number] ?Patient Account Number: [id number] ?Date of Birth/Sex: 12-06-1980 (41 y.o. 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

## 2021-08-03 NOTE — Progress Notes (Signed)
BENJI, POYNTER ([id number]) ?Visit Report for 08/03/2021 ?Arrival Information Details ?Patient Name: TAVARIOUS, FREEL ?Date of Service: 08/03/2021 8:00 AM ?Medical Record Number: [id number] ?Patient Account Number: [id number] ?Date of Birth/Sex: [birth year] (41 y.o. M) ?Treating RN: Donnamarie Poag ?Primary Care Jermayne Sweeney: [name] Other Clinician: ?Referring Cheray Pardi: [name] ?Treating Villa Burgin/Extender: Jeri Cos ?Weeks in Treatment: 243 ?Visit Information History Since Last Visit ?Added or deleted any medications: No ?Patient Arrived: Ambulatory ?Had a fall or experienced change in No ?Arrival Time: 08:05 ?activities of daily living that may affect ?Accompanied By: self ?risk of falls: ?Transfer Assistance: None ?Hospitalized since last visit: No ?Patient Identification Verified: Yes ?Has Dressing in Place as Prescribed: Yes ?Secondary Verification Process Completed: Yes ?Has Compression in Place as Prescribed: Yes ?Patient Requires Transmission-Based No ?Pain Present Now: No ?Precautions: ?Patient Has Alerts: Yes ?Patient Alerts: Patient has reaction ?to ?silver dressings. ?Electronic Signature(s) ?Signed: 08/03/2021 4:01:57 PM By: Donnamarie Poag ?Entered ByDonnamarie Poag on 08/03/2021 08:19:14 ?CEDAR, DITULLIO ([id number]) ?-------------------------------------------------------------------------------- ?Compression Therapy Details ?Patient Name: DEFORREST, BOGLE ?Date of Service: 08/03/2021 8:00 AM ?Medical Record Number: [id number] ?Patient Account Number: [id number] ?Date of Birth/Sex: [birth year] (41 y.o. M) ?Treating RN: Donnamarie Poag ?Primary Care Jonnelle Lawniczak: [name] Other Clinician: ?Referring Katia Hannen: [name] ?Treating Hayzel Ruberg/Extender: Jeri Cos ?Weeks in Treatment: 243 ?Compression Therapy Performed for Wound Assessment: Wound #1 Left,Lateral Lower Leg ?Performed By: Clinician Donnamarie Poag, RN ?Compression Type: Four Layer ?Electronic Signature(s) ?Signed: 08/03/2021 4:01:57 PM By: Donnamarie Poag ?Entered ByDonnamarie Poag on 08/03/2021 08:19:52 ?ODIE, EDMONDS ([id number]) ?-------------------------------------------------------------------------------- ?Encounter Discharge Information Details ?Patient Name: KANE, KUSEK ?Date of Service: 08/03/2021 8:00 AM ?Medical Record Number: [id number] ?Patient Account Number: [id number] ?Date of Birth/Sex: 1980/07/07 (41 y.o. M) ?Treating RN: Donnamarie Poag ?Primary Care Terry Abila: [name] Other Clinician: ?Referring Anirudh Baiz: [name] ?Treating Mayla Biddy/Extender: Jeri Cos ?Weeks in Treatment: 243 ?Encounter Discharge Information Items ?Discharge Condition: Stable ?Ambulatory Status: Ambulatory ?Discharge Destination: Home ?Transportation: Private Auto ?Accompanied By: self ?Schedule Follow-up Appointment: Yes ?Clinical Summary of Care: ?Electronic Signature(s) ?Signed: 08/03/2021 4:01:57 PM By: Donnamarie Poag ?Entered ByDonnamarie Poag on 08/03/2021 08:22:57 ?KOUA, DEEG ([id number]) ?-------------------------------------------------------------------------------- ?Wound Assessment Details ?Patient Name: DORTHY, HUSTEAD ?Date of Service: 08/03/2021 8:00 AM ?Medical Record Number: [id number] ?Patient Account Number: [id number] ?Date of Birth/Sex: 04/29/1981 (41 y.o. M) ?Treating RN: Donnamarie Poag ?Primary Care [place]: [name] Other Clinician: ?Referring Jazmyne Beauchesne: [name] ?Treating Brentley Horrell/Extender: Jeri Cos ?Weeks in Treatment: 243 ?Wound Status ?Wound Number: 1 ?Primary Etiology: Pyoderma ?Wound Location: Left, Lateral Lower Leg ?Wound Status: Open ?Wounding Event: Gradually Appeared ?Comorbid History: Sleep Apnea, Hypertension, Colitis ?Date Acquired: 11/18/2015 ?Weeks Of Treatment: 243 ?Clustered Wound: No ?Wound Measurements ?Length: (cm) 3.2 ?Width: (cm) 2 ?Depth: (cm) 0.4 ?Area: (cm?) 5.027 ?Volume: (cm?) 2.011 ?% Reduction in Area: -2.4% ?% Reduction in Volume: 48.8% ?Epithelialization: Medium (34-66%) ?Wound Description ?Classification:  Full Thickness With Exposed Support Structures ?Wound Margin: Flat and Intact ?Exudate Amount: Medium ?Exudate Type: Serosanguineous ?Exudate Color: red, brown ?Foul Odor After Cleansing: No ?Slough/Fibrino Yes ?Wound Bed ?Granulation Amount: Large (67-100%) Exposed Structure ?Granulation Quality: Red, Pink ?Fascia Exposed: No ?Necrotic Amount: Small (1-33%) ?Fat Layer (Subcutaneous Tissue) Exposed: Yes ?Necrotic Quality: Adherent Slough ?Tendon Exposed: No ?Muscle Exposed: No ?Joint Exposed: No ?Bone Exposed: No ?Treatment Notes ?Wound #1 (Lower Leg) Wound Laterality: Left, Lateral ?Cleanser ?Wound Cleanser ?Discharge Instruction: Wash your hands with soap and water. Remove old dressing, discard into plastic bag and place into trash. ?Cleanse the wound with Wound Cleanser prior to applying a clean dressing using gauze sponges,  not tissues or cotton balls. Do ?not scrub or use excessive force. Pat dry using gauze sponges, not tissue or cotton balls. ?Peri-Wound Care ?Topical ?Clobetasol Propionate ointment 0.05%, 60 (g) tube ?Discharge Instruction: Pt to bring to appt- ?Primary Dressing ?Hydrofera Blue Ready Transfer Foam, 4x5 (in/in) ?Discharge Instruction: Apply Hydrofera Blue Ready to wound bed as directed ?Secondary Dressing ?(NON-BORDER) Zetuvit Plus Silicone NON-BORDER 5x5 (in/in) ?Secured With ?JAQWON, MANFRED ([id number]) ?Compression Wrap ?Medichoice 4 layer Compression System, 35-40 mmHG ?Discharge Instruction: Apply multi-layer wrap as directed. ?Compression Stockings ?Add-Ons ?Electronic Signature(s) ?Signed: 08/03/2021 4:01:57 PM By: Donnamarie Poag ?Entered ByDonnamarie Poag on 08/03/2021 08:19:36 ?

## 2021-08-06 ENCOUNTER — Other Ambulatory Visit: Payer: Self-pay

## 2021-08-06 ENCOUNTER — Encounter: Payer: 59 | Admitting: Physician Assistant

## 2021-08-06 DIAGNOSIS — I872 Venous insufficiency (chronic) (peripheral): Secondary | ICD-10-CM | POA: Diagnosis not present

## 2021-08-06 NOTE — Progress Notes (Addendum)
MONTERRIO, GERST ([id number]) ?Visit Report for 08/06/2021 ?Arrival Information Details ?Patient Name: [name], [name] ?Date of Service: 08/06/2021 8:15 AM ?Medical Record Number: [id number] ?Patient Account Number: [id number] ?Date of Birth/Sex: 1981-04-14 (41 y.o. M) ?Treating RN: Carlene Coria ?Primary Care Munirah Doerner: [name] Other Clinician: ?Referring Renji Berwick: [name] ?Treating Vint Pola/Extender: Jeri Cos ?Weeks in Treatment: 243 ?Visit Information History Since Last Visit ?All ordered tests and consults were completed: No ?Patient Arrived: Ambulatory ?Added or deleted any medications: No ?Arrival Time: 08:18 ?Any new allergies or adverse reactions: No ?Accompanied By: self ?Had a fall or experienced change in No ?Transfer Assistance: None ?activities of daily living that may affect ?Patient Identification Verified: Yes ?risk of falls: ?Secondary Verification Process Completed: Yes ?Signs or symptoms of abuse/neglect since last visito No ?Patient Requires Transmission-Based No ?Hospitalized since last visit: No ?Precautions: ?Implantable device outside of the clinic excluding No ?Patient Has Alerts: Yes ?cellular tissue based products placed in the center ?Patient Alerts: Patient has reaction ?since last visit: ?to ?Has Dressing in Place as Prescribed: Yes ?silver dressings. ?Has Compression in Place as Prescribed: Yes ?Pain Present Now: No ?Electronic Signature(s) ?Signed: 08/06/2021 2:47:27 PM By: Carlene Coria RN ?Entered ByCarlene Coria on 08/06/2021 08:19:02 ?[name], [name] ([id number]) ?-------------------------------------------------------------------------------- ?Clinic Level of Care Assessment Details ?Patient Name: [name], [name] ?Date of Service: 08/06/2021 8:15 AM ?Medical Record Number: [id number] ?Patient Account Number: [id number] ?Date of Birth/Sex: August 23, 1980 (41 y.o. M) ?Treating RN: Carlene Coria ?Primary Care Jolan Mealor: [name] Other Clinician: ?Referring Taavi Hoose: [name] ?Treating Shonta Phillis/Extender: Jeri Cos ?Weeks in Treatment: 243 ?Clinic Level of Care Assessment Items ?TOOL 1 Quantity Score ?[]  - Use when EandM and Procedure is performed on INITIAL visit 0 ?ASSESSMENTS - Nursing Assessment / Reassessment ?[]  - General Physical Exam (combine w/ comprehensive assessment (listed just below) when performed on new ?0 ?pt. evals) ?[]  - 0 ?Comprehensive Assessment (HX, ROS, Risk Assessments, Wounds Hx, etc.) ?ASSESSMENTS - Wound and Skin Assessment / Reassessment ?[]  - Dermatologic / Skin Assessment (not related to wound area) 0 ?ASSESSMENTS - Ostomy and/or Continence Assessment and Care ?[]  - Incontinence Assessment and Management 0 ?[]  - 0 ?Ostomy Care Assessment and Management (repouching, etc.) ?PROCESS - Coordination of Care ?[]  - Simple Patient / Family Education for ongoing care 0 ?[]  - 0 ?Complex (extensive) Patient / Family Education for ongoing care ?[]  - 0 ?Staff obtains Consents, Records, Test Results / Process Orders ?[]  - 0 ?Staff telephones HHA, Nursing Homes / Clarify orders / etc ?[]  - 0 ?Routine Transfer to another Facility (non-emergent condition) ?[]  - 0 ?Routine Hospital Admission (non-emergent condition) ?[]  - 0 ?New Admissions / Biomedical engineer / Ordering NPWT, Apligraf, etc. ?[]  - 0 ?Emergency Hospital Admission (emergent condition) ?PROCESS - Special Needs ?[]  - Pediatric / Minor Patient Management 0 ?[]  - 0 ?Isolation Patient Management ?[]  - 0 ?Hearing / Language / Visual special needs ?[]  - 0 ?Assessment of Community assistance (transportation, D/C planning, etc.) ?[]  - 0 ?Additional assistance / Altered mentation ?[]  - 0 ?Support Surface(s) Assessment (bed, cushion, seat, etc.) ?INTERVENTIONS - Miscellaneous ?[]  - External ear exam 0 ?[]  - 0 ?Patient Transfer (multiple staff / Civil Service fast streamer / Similar devices) ?[]  - 0 ?Simple Staple / Suture removal (25 or less) ?[]  - 0 ?Complex Staple / Suture removal (26 or more) ?[]  -  0 ?Hypo/Hyperglycemic Management (do not check if billed separately) ?[]  - 0 ?Ankle / Brachial Index (ABI) - do not check if billed separately ?Has the patient been seen at the  hospital within the last three years: Yes ?Total Score: 0 ?Level Of Care: ____ ?[name], [name] ([id number]) ?Electronic Signature(s) ?Signed: 08/06/2021 3:57:12 PM By: Carlene Coria RN ?Entered By: Carlene Coria on 08/06/2021 15:35:26 ?[name], [name] ([id number]) ?-------------------------------------------------------------------------------- ?Compression Therapy Details ?Patient Name: [name], [name] ?Date of Service: 08/06/2021 8:15 AM ?Medical Record Number: [id number] ?Patient Account Number: [id number] ?Date of Birth/Sex: 1981-02-13 (41 y.o. M) ?Treating RN: Carlene Coria ?Primary Care Ilhan Madan: [name] Other Clinician: ?Referring Dayquan Buys: [name] ?Treating Massie Mees/Extender: Jeri Cos ?Weeks in Treatment: 243 ?Compression Therapy Performed for Wound Assessment: Wound #1 Left,Lateral Lower Leg ?Performed By: Clinician Sharyn Creamer, RN ?Compression Type: Four Layer ?Pre Treatment ABI: 1.2 ?Post Procedure Diagnosis ?Same as Pre-procedure ?Electronic Signature(s) ?Signed: 08/06/2021 3:35:14 PM By: Carlene Coria RN ?Previous Signature: 08/06/2021 9:22:15 AM Version By: Worthy Keeler PA-C ?Entered By: Carlene Coria on 08/06/2021 15:35:14 ?[name], [name] ([id number]) ?-------------------------------------------------------------------------------- ?Encounter Discharge Information Details ?Patient Name: [name], [name] ?Date of Service: 08/06/2021 8:15 AM ?Medical Record Number: [id number] ?Patient Account Number: [id number] ?Date of Birth/Sex: 12-31-1980 (41 y.o. M) ?Treating RN: Carlene Coria ?Primary Care Kosta Schnitzler: [name] Other Clinician: ?Referring Natasia Sanko: [name] ?Treating Toure Edmonds/Extender: Jeri Cos ?Weeks in Treatment: 243 ?Encounter Discharge Information Items ?Discharge Condition: Stable ?Ambulatory Status:  Ambulatory ?Discharge Destination: Home ?Transportation: Private Auto ?Schedule Follow-up Appointment: Yes ?Clinical Summary of Care: Patient Declined ?Electronic Signature(s) ?Signed: 08/06/2021 2:47:27 PM By: Carlene Coria RN ?Entered ByCarlene Coria on 08/06/2021 09:04:18 ?[name], [name] ([id number]) ?-------------------------------------------------------------------------------- ?Lower Extremity Assessment Details ?Patient Name: [name], [name] ?Date of Service: 08/06/2021 8:15 AM ?Medical Record Number: [id number] ?Patient Account Number: [id number] ?Date of Birth/Sex: 05/02/1981 (41 y.o. M) ?Treating RN: Carlene Coria ?Primary Care Addisynn Vassell: [name] Other Clinician: ?Referring Falon Huesca: [name] ?Treating Lurae Hornbrook/Extender: Jeri Cos ?Weeks in Treatment: 243 ?Edema Assessment ?Assessed: [Left: No] [Right: No] ?Edema: [Left: Ye] [Right: s] ?Calf ?Left: Right: ?Point of Measurement: 35 cm From Medial Instep 48 cm ?Ankle ?Left: Right: ?Point of Measurement: 10 cm From Medial Instep 27 cm ?Vascular Assessment ?Pulses: ?Dorsalis Pedis ?Palpable: [Left:Yes] ?Electronic Signature(s) ?Signed: 08/06/2021 2:47:27 PM By: Carlene Coria RN ?Entered ByCarlene Coria on 08/06/2021 08:26:47 ?[name], [name] ([id number]) ?-------------------------------------------------------------------------------- ?Multi Wound Chart Details ?Patient Name: [name], SPAINHOUR ?Date of Service: 08/06/2021 8:15 AM ?Medical Record Number: [id number] ?Patient Account Number: [id number] ?Date of Birth/Sex: 1980/08/21 (41 y.o. 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

## 2021-08-06 NOTE — Progress Notes (Addendum)
[name], [name] ([id number]) ?Visit Report for 08/06/2021 ?Chief Complaint Document Details ?Patient Name: [name], [name] ?Date of Service: 08/06/2021 8:15 AM ?Medical Record Number: [id number] ?Patient Account Number: [id number] ?Date of Birth/Sex: 24-Sep-1980 (41 y.o. 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 M) ?Treating RN: Sharyn Creamer ?Primary Care Provider: [name] Other Clinician: ?Referring Provider: [name] ?Treating Provider/Extender: Jeri Cos ?Weeks in Treatment: 243 ?History of Present Illness ?HPI Description: 12/04/16; 41 year old man who comes into the clinic today for review of a wound on the posterior left calf. He tells me that is ?been there for about a year. He is not a diabetic he does smoke half a pack per day. He was seen in the ER on 11/20/16 felt to have cellulitis around ?the wound and was given clindamycin. An x-ray did not show osteomyelitis. The patient initially tells me that he has a milk allergy that sets off a ?pruritic itching rash on his lower legs which she scratches incessantly and he thinks that's what may have set up the wound. He has been using ?various topical antibiotics and ointments without any effect. He works in a [employer]ing [employer] and is on his feet all day. He does not have a  prior ?history of wounds however he does have the rash on both lower legs the right arm and the ventral aspect of his left arm. These are excoriations ?and clearly have had scratching however there are of macular looking areas on both legs including a substantial larger area on the right leg. This ?does not have an underlying open area. There is no blistering. 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 He seemed to recognize this. On further questioning he admits ?to a 5 year history of quiesced. ulcerative colitis. He is not in any treatment for this. He's had no recent travel ?12/11/16; the patient arrives today with his wound and roughly the same condition we've been using silver alginate this is a deep punched out ?wound with some surrounding erythema but no tenderness. 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 I'm really not able to understand this ?We have also received information from the patient's dermatologist in [place] notes from April 2016. This was a doctor Agarwal-antal. 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 He was prescribed topical steroid high potency under occlusion which helped but at this point the ?patient did not have a deep punched out wound. ?12/18/16; the patient's wound is larger in terms of surface area however this surface looks better and there is less depth. The surrounding erythema ?also is better. The patient states that the wrap we put on came off 2 days ago when he has been using his compression stockings.  He we are in ?the process of getting a dermatology consult. ?12/26/16 on evaluation today patient's left lower extremity wound shows evidence of  infection with surrounding erythema noted. He has been ?tolerating the dressing changes but states that he has noted more discomfort. There is a larger area of erythema surrounding the wound. No ?fevers, chills, nausea, or vomiting noted at this time. With that being said the wound still does have slough covering the surface. He is not allergic ?to any medication that he is aware of at this point. In regard to his right lower extremity he had several regions that are erythematous and ?pruritic he wonders if there's anything we can do to help that. ?01/02/17 I reviewed patient's wound culture which was obtained his visit last week. He was placed on doxycycline at that point. Unfortunately that ?does not appear to be an antibiotic that would likely help with the situation however the pseudomonas noted on culture is sensitive to Cipro. Also ?unfortunately patient's wound seems to have a large compared to last week's evaluation. Not severely so but there are definitely increased ?measurements in general. He is continuing to have discomfort as well he writes this to be a seven out of 10. In fact he would prefer me not to ?perform any debridement today due to the fact that he is having discomfort and considering he has an active infection on the little reluctant to do ?so anyway. No fevers, chills, nausea, or vomiting noted at this time. ?01/08/17; patient seems dermatology on September 5. I suspect dermatology will want the slides from the biopsy I did sent to their pathologist. ?I'm not sure if there is a way we can expedite that. In any case the culture I did before I left on vacation 3 weeks ago showed Pseudomonas he ?was given 10 days of Cipro and per her description of her intake nurses is actually somewhat better this week although the wound is quite a bit ?bigger than I remember the last time I saw this. He still has 3 more days of Cipro ?01/21/17; dermatology appointment tomorrow. He has completed the ciprofloxacin  for Pseudomonas. Surface of the wound looks better however he ?is had some deterioration in the lesions on his right leg. Meantime the left lateral leg wound we will continue with sample ?01/29/17; patient had his dermatology appointment but I can't yet see that note. He is completed his antibiotics. The wound is more superficial but ?considerably larger in circumferential area than when he came in. This is in his left lateral calf. He also has swollen erythematous areas with ?superficial wounds on the right leg and small papular areas on both arms. There apparently areas in her his upper thighs and buttocks I did not ?look at those. Dermatology biopsied the right leg. Hopefully will have their input next week. ?02/05/17; patient went back to see his dermatologist who told him that he had a "scratching problem" as well as staph. He is now on a 30 day ?course of doxycycline and I believe she gave him triamcinolone cream to the right leg areas to help with the itching [not exactly sure but probably ?triamcinolone]. She apparently looked at the left lateral leg wound although this was not rebiopsied and I think felt to be ultimately part of the ?same pathogenesis. He is using sample border foam and changing nevus himself. He now has a new open area on the right posterior leg which ?was his biopsy site ?I don't have any of the dermatology notes ?  02/12/17; we put the patient in compression last week with SANTYL to the wound on the left leg and the biopsy. Edema is much better and the ?depth of the wound is now at level of skin. Area is still the same ?oBiopsy site on the right lateral leg we've also been using santyl with a border foam dressing and he is changing this himself. ?02/19/17; Using silver alginate started last week to both the substantial left leg wound and the biopsy site on the right wound. He is tolerating ?compression well. Has a an appointment with his primary M.D. tomorrow wondering about diuretics although  I'm wondering if the edema problem ?is actually lymphedema ?02/26/17; the patient has been to see his primary doctor Dr. Jerrel Ivory at [facility] our primary care. She started him on Lasix 20 mg and this ?seems

## 2021-08-08 ENCOUNTER — Other Ambulatory Visit: Payer: Self-pay

## 2021-08-08 DIAGNOSIS — I872 Venous insufficiency (chronic) (peripheral): Secondary | ICD-10-CM | POA: Diagnosis not present

## 2021-08-08 NOTE — Progress Notes (Signed)
GOLDEN, GILREATH ([id number]) ?Visit Report for 08/08/2021 ?Arrival Information Details ?Patient Name: [name], [name] ?Date of Service: 08/08/2021 8:00 AM ?Medical Record Number: [id number] ?Patient Account Number: [id number] ?Date of Birth/Sex: 13-Apr-1981 (41 y.o. M) ?Treating RN: Donnamarie Poag ?Primary Care Garrette Caine: [name] Other Clinician: ?Referring [name] Folmar: [name] ?Treating Zahli Vetsch/Extender: Kalman Shan ?Weeks in Treatment: 244 ?Visit Information History Since Last Visit ?Added or deleted any medications: No ?Patient Arrived: Ambulatory ?Had a fall or experienced change in No ?Arrival Time: 08:04 ?activities of daily living that may affect ?Accompanied By: self ?risk of falls: ?Transfer Assistance: None ?Hospitalized since last visit: No ?Patient Identification Verified: Yes ?Has Dressing in Place as Prescribed: Yes ?Secondary Verification Process Completed: Yes ?Has Compression in Place as Prescribed: Yes ?Patient Requires Transmission-Based No ?Pain Present Now: No ?Precautions: ?Patient Has Alerts: Yes ?Patient Alerts: Patient has reaction ?to ?silver dressings. ?Electronic Signature(s) ?Signed: 08/08/2021 10:55:40 AM By: Donnamarie Poag ?Entered ByDonnamarie Poag on 08/08/2021 08:05:18 ?IVA, POSTEN ([id number]) ?-------------------------------------------------------------------------------- ?Compression Therapy Details ?Patient Name: [name], [name] ?Date of Service: 08/08/2021 8:00 AM ?Medical Record Number: [id number] ?Patient Account Number: [id number] ?Date of Birth/Sex: Mar 31, 1981 (41 y.o. M) ?Treating RN: Donnamarie Poag ?Primary Care Willys Salvino: [name] Other Clinician: ?Referring Seraphim Trow: [name] ?Treating Welma Mccombs/Extender: Kalman Shan ?Weeks in Treatment: 244 ?Compression Therapy Performed for Wound Assessment: Wound #1 Left,Lateral Lower Leg ?Performed By: Clinician Donnamarie Poag, RN ?Compression Type: Four Layer ?Electronic Signature(s) ?Signed: 08/08/2021 10:55:40 AM By:  Donnamarie Poag ?Entered ByDonnamarie Poag on 08/08/2021 08:19:36 ?OSVALDO, LAMPING ([id number]) ?-------------------------------------------------------------------------------- ?Encounter Discharge Information Details ?Patient Name: [name], [name] ?Date of Service: 08/08/2021 8:00 AM ?Medical Record Number: [id number] ?Patient Account Number: [id number] ?Date of Birth/Sex: [birth year] (41 y.o. M) ?Treating RN: Donnamarie Poag ?Primary Care Blu Lori: [name] Other Clinician: ?Referring Rena Hunke: [name] ?Treating Kenn Rekowski/Extender: Kalman Shan ?Weeks in Treatment: 244 ?Encounter Discharge Information Items ?Discharge Condition: Stable ?Ambulatory Status: Ambulatory ?Discharge Destination: Home ?Transportation: Private Auto ?Accompanied By: self ?Schedule Follow-up Appointment: Yes ?Clinical Summary of Care: ?Electronic Signature(s) ?Signed: 08/08/2021 10:55:40 AM By: Donnamarie Poag ?Entered ByDonnamarie Poag on 08/08/2021 08:23:39 ?DRAEDYN, WEIDINGER ([id number]) ?-------------------------------------------------------------------------------- ?Wound Assessment Details ?Patient Name: [name], [name] ?Date of Service: 08/08/2021 8:00 AM ?Medical Record Number: [id number] ?Patient Account Number: [id number] ?Date of Birth/Sex: 10/23/1980 (41 y.o. M) ?Treating RN: Donnamarie Poag ?Primary Care Mareesa Gathright: [name] Other Clinician: ?Referring Raquon Milledge: [name] ?Treating Kalief [name]/Extender: Kalman Shan ?Weeks in Treatment: 244 ?Wound Status ?Wound Number: 1 ?Primary Etiology: Pyoderma ?Wound Location: Left, Lateral Lower Leg ?Wound Status: Open ?Wounding Event: Gradually Appeared ?Comorbid History: Sleep Apnea, Hypertension, Colitis ?Date Acquired: 11/18/2015 ?Weeks Of Treatment: 244 ?Clustered Wound: No ?Wound Measurements ?Length: (cm) 3 ?Width: (cm) 2.1 ?Depth: (cm) 0.4 ?Area: (cm?) 4.948 ?Volume: (cm?) 1.979 ?% Reduction in Area: -0.8% ?% Reduction in Volume: 49.6% ?Epithelialization: Medium (34-66%) ?Wound  Description ?Classification: Full Thickness With Exposed Support Structures ?Wound Margin: Flat and Intact ?Exudate Amount: Medium ?Exudate Type: Serosanguineous ?Exudate Color: red, brown ?Foul Odor After Cleansing: No ?Slough/Fibrino Yes ?Wound Bed ?Granulation Amount: Large (67-100%) Exposed Structure ?Granulation Quality: Red, Pink ?Fascia Exposed: No ?Necrotic Amount: Small (1-33%) ?Fat Layer (Subcutaneous Tissue) Exposed: Yes ?Necrotic Quality: Adherent Slough ?Tendon Exposed: No ?Muscle Exposed: No ?Joint Exposed: No ?Bone Exposed: No ?Treatment Notes ?Wound #1 (Lower Leg) Wound Laterality: Left, Lateral ?Cleanser ?Wound Cleanser ?Discharge Instruction: Wash your hands with soap and water. Remove old dressing, discard into plastic bag and place into trash. ?Cleanse the wound with Wound Cleanser prior to applying a clean dressing using gauze sponges,  not tissues or cotton balls. Do ?not scrub or use excessive force. Pat dry using gauze sponges, not tissue or cotton balls. ?Peri-Wound Care ?Topical ?Clobetasol Propionate ointment 0.05%, 60 (g) tube ?Discharge Instruction: Pt to bring to appt- ?Primary Dressing ?Hydrofera Blue Ready Transfer Foam, 4x5 (in/in) ?Discharge Instruction: Apply Hydrofera Blue Ready to wound bed as directed ?Secondary Dressing ?(NON-BORDER) Zetuvit Plus Silicone NON-BORDER 5x5 (in/in) ?Secured With ?DALVIN, CLIPPER ([id number]) ?Compression Wrap ?Medichoice 4 layer Compression System, 35-40 mmHG ?Discharge Instruction: Apply multi-layer wrap as directed. ?Compression Stockings ?Add-Ons ?Electronic Signature(s) ?Signed: 08/08/2021 10:55:40 AM By: Donnamarie Poag ?Entered ByDonnamarie Poag on 08/08/2021 08:19:16 ?

## 2021-08-08 NOTE — Progress Notes (Signed)
RYLEY, BACHTEL ([id number]) ?Visit Report for 08/08/2021 ?Physician Orders Details ?Patient Name: [name], [name] ?Date of Service: 08/08/2021 8:00 AM ?Medical Record Number: [id number] ?Patient Account Number: [id number] ?Date of Birth/Sex: 1981-05-12 (41 y.o. M) ?Treating RN: Donnamarie Poag ?Primary Care Provider: [name] Other Clinician: ?Referring Provider: [name] ?Treating Provider/Extender: Kalman Shan ?Weeks in Treatment: 244 ?Verbal / Phone Orders: No ?Diagnosis Coding ?Follow-up Appointments ?o Return Appointment in 1 week. ?o Nurse Visit as needed - twice a week ?Bathing/ Shower/ Hygiene ?o Clean wound with Normal Saline or wound cleanser. ?o May shower with wound dressing protected with water repellent cover or cast protector. ?Edema Control - Lymphedema / Segmental Compressive Device / Other ?o Optional: One layer of unna paste to top of compression wrap (to act as an anchor). ?o Elevate, Exercise Daily and Avoid Standing for Long Periods of Time. ?o Elevate legs to the level of the heart and pump ankles as often as possible ?o Elevate leg(s) parallel to the floor when sitting. ?Wound Treatment ?Wound #1 - Lower Leg Wound Laterality: Left, Lateral ?Cleanser: Wound Cleanser 3 x Per Week/30 Days ?Discharge Instructions: Wash your hands with soap and water. Remove old dressing, discard into plastic bag and place into trash. ?Cleanse the wound with Wound Cleanser prior to applying a clean dressing using gauze sponges, not tissues or cotton balls. Do not ?scrub or use excessive force. Pat dry using gauze sponges, not tissue or cotton balls. ?Topical: Clobetasol Propionate ointment 0.05%, 60 (g) tube 3 x Per Week/30 Days ?Discharge Instructions: Pt to bring to appt- ?Primary Dressing: Hydrofera Blue Ready Transfer Foam, 4x5 (in/in) 3 x Per Week/30 Days ?Discharge Instructions: Apply Hydrofera Blue Ready to wound bed as directed ?Secondary Dressing: (NON-BORDER) Zetuvit Plus Silicone  NON-BORDER 5x5 (in/in) 3 x Per Week/30 Days ?Compression Wrap: Medichoice 4 layer Compression System, 35-40 mmHG (Generic) 3 x Per Week/30 Days ?Discharge Instructions: Apply multi-layer wrap as directed. ?Electronic Signature(s) ?Signed: 08/08/2021 10:55:40 AM By: Donnamarie Poag ?Signed: 08/08/2021 11:00:13 AM By: Kalman Shan DO ?Entered ByDonnamarie Poag on 08/08/2021 08:20:15 ?[name], [name] ([id number]) ?-------------------------------------------------------------------------------- ?SuperBill Details ?Patient Name: [name], [name] ?Date of Service: 08/08/2021 ?Medical Record Number: [id number] ?Patient Account Number: [id number] ?Date of Birth/Sex: 1980/08/13 (41 y.o. 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

## 2021-08-10 ENCOUNTER — Other Ambulatory Visit: Payer: Self-pay

## 2021-08-10 DIAGNOSIS — I872 Venous insufficiency (chronic) (peripheral): Secondary | ICD-10-CM | POA: Diagnosis not present

## 2021-08-10 NOTE — Progress Notes (Signed)
ZAKEE, DEERMAN ([id number]) ?Visit Report for 08/10/2021 ?Arrival Information Details ?Patient Name: [name], [name] ?Date of Service: 08/10/2021 8:00 AM ?Medical Record Number: [id number] ?Patient Account Number: [id number] ?Date of Birth/Sex: 01/31/1981 (41 y.o. M) ?Treating RN: Donnamarie Poag ?Primary Care Kenny Stern: [name] Other Clinician: ?Referring Samule Life: [name] ?Treating Devra Stare/Extender: Jeri Cos ?Weeks in Treatment: 244 ?Visit Information History Since Last Visit ?Added or deleted any medications: No ?Patient Arrived: Ambulatory ?Had a fall or experienced change in No ?Arrival Time: 08:05 ?activities of daily living that may affect ?Accompanied By: self ?risk of falls: ?Transfer Assistance: None ?Hospitalized since last visit: No ?Patient Identification Verified: Yes ?Has Dressing in Place as Prescribed: Yes ?Secondary Verification Process Completed: Yes ?Has Compression in Place as Prescribed: Yes ?Patient Requires Transmission-Based No ?Pain Present Now: No ?Precautions: ?Patient Has Alerts: Yes ?Patient Alerts: Patient has reaction ?to ?silver dressings. ?Electronic Signature(s) ?Signed: 08/10/2021 2:33:48 PM By: Donnamarie Poag ?Entered ByDonnamarie Poag on 08/10/2021 08:20:56 ?KALUP, JAQUITH ([id number]) ?-------------------------------------------------------------------------------- ?Compression Therapy Details ?Patient Name: [name], [name] ?Date of Service: 08/10/2021 8:00 AM ?Medical Record Number: [id number] ?Patient Account Number: [id number] ?Date of Birth/Sex: 12-Apr-1981 (41 y.o. M) ?Treating RN: Donnamarie Poag ?Primary Care Zaineb Nowaczyk: [name] Other Clinician: ?Referring Joelee Snoke: [name] ?Treating Christain Mcraney/Extender: Jeri Cos ?Weeks in Treatment: 244 ?Compression Therapy Performed for Wound Assessment: Wound #1 Left,Lateral Lower Leg ?Performed By: Clinician Donnamarie Poag, RN ?Compression Type: Four Layer ?Electronic Signature(s) ?Signed: 08/10/2021 2:33:48 PM By: Donnamarie Poag ?Entered ByDonnamarie Poag on 08/10/2021 08:21:43 ?CONN, TROMBETTA ([id number]) ?-------------------------------------------------------------------------------- ?Encounter Discharge Information Details ?Patient Name: [name], [name] ?Date of Service: 08/10/2021 8:00 AM ?Medical Record Number: [id number] ?Patient Account Number: [id number] ?Date of Birth/Sex: [birth year] (41 y.o. M) ?Treating RN: Donnamarie Poag ?Primary Care Toma Erichsen: [name] Other Clinician: ?Referring Lukka Black: [name] ?Treating Karas Pickerill/Extender: Jeri Cos ?Weeks in Treatment: 244 ?Encounter Discharge Information Items ?Discharge Condition: Stable ?Ambulatory Status: Ambulatory ?Discharge Destination: Home ?Transportation: Private Auto ?Accompanied By: self ?Schedule Follow-up Appointment: Yes ?Clinical Summary of Care: ?Electronic Signature(s) ?Signed: 08/10/2021 2:33:48 PM By: Donnamarie Poag ?Entered ByDonnamarie Poag on 08/10/2021 08:22:59 ?KIMBERLY, COYE ([id number]) ?-------------------------------------------------------------------------------- ?Wound Assessment Details ?Patient Name: [name], [name] ?Date of Service: 08/10/2021 8:00 AM ?Medical Record Number: [id number] ?Patient Account Number: [id number] ?Date of Birth/Sex: Nov 29, 1980 (41 y.o. M) ?Treating RN: Donnamarie Poag ?Primary Care Aslan Montagna: [name] Other Clinician: ?Referring Sheronda Parran: [name] ?Treating Shahida Schnackenberg/Extender: Jeri Cos ?Weeks in Treatment: 244 ?Wound Status ?Wound Number: 1 ?Primary Etiology: Pyoderma ?Wound Location: Left, Lateral Lower Leg ?Wound Status: Open ?Wounding Event: Gradually Appeared ?Comorbid History: Sleep Apnea, Hypertension, Colitis ?Date Acquired: 11/18/2015 ?Weeks Of Treatment: 244 ?Clustered Wound: No ?Wound Measurements ?Length: (cm) 3 ?Width: (cm) 2.1 ?Depth: (cm) 0.4 ?Area: (cm?) 4.948 ?Volume: (cm?) 1.979 ?% Reduction in Area: -0.8% ?% Reduction in Volume: 49.6% ?Epithelialization: Medium (34-66%) ?Wound Description ?Classification:  Full Thickness With Exposed Support Structures ?Wound Margin: Flat and Intact ?Exudate Amount: Medium ?Exudate Type: Serosanguineous ?Exudate Color: red, brown ?Foul Odor After Cleansing: No ?Slough/Fibrino Yes ?Wound Bed ?Granulation Amount: Large (67-100%) Exposed Structure ?Granulation Quality: Red, Pink ?Fascia Exposed: No ?Necrotic Amount: Small (1-33%) ?Fat Layer (Subcutaneous Tissue) Exposed: Yes ?Necrotic Quality: Adherent Slough ?Tendon Exposed: No ?Muscle Exposed: No ?Joint Exposed: No ?Bone Exposed: No ?Treatment Notes ?Wound #1 (Lower Leg) Wound Laterality: Left, Lateral ?Cleanser ?Wound Cleanser ?Discharge Instruction: Wash your hands with soap and water. Remove old dressing, discard into plastic bag and place into trash. ?Cleanse the wound with Wound Cleanser prior to applying a clean dressing using gauze sponges,  not tissues or cotton balls. Do ?not scrub or use excessive force. Pat dry using gauze sponges, not tissue or cotton balls. ?Peri-Wound Care ?Topical ?Clobetasol Propionate ointment 0.05%, 60 (g) tube ?Discharge Instruction: Pt to bring to appt- ?Primary Dressing ?Hydrofera Blue Ready Transfer Foam, 4x5 (in/in) ?Discharge Instruction: Apply Hydrofera Blue Ready to wound bed as directed ?Secondary Dressing ?(NON-BORDER) Zetuvit Plus Silicone NON-BORDER 5x5 (in/in) ?Secured With ?KONNAR, BEN ([id number]) ?Compression Wrap ?Medichoice 4 layer Compression System, 35-40 mmHG ?Discharge Instruction: Apply multi-layer wrap as directed. ?Compression Stockings ?Add-Ons ?Electronic Signature(s) ?Signed: 08/10/2021 2:33:48 PM By: Donnamarie Poag ?Entered ByDonnamarie Poag on 08/10/2021 08:21:20 ?

## 2021-08-11 NOTE — Progress Notes (Signed)
[name], [name] ([id number]) ?Visit Report for 08/10/2021 ?Physician Orders Details ?Patient Name: [name], [name] ?Date of Service: 08/10/2021 8:00 AM ?Medical Record Number: [id number] ?Patient Account Number: [id number] ?Date of Birth/Sex: October 21, 1980 (41 y.o. M) ?Treating RN: Donnamarie Poag ?Primary Care Provider: [name] Other Clinician: ?Referring Provider: [name] ?Treating Provider/Extender: Jeri Cos ?Weeks in Treatment: 244 ?Verbal / Phone Orders: No ?Diagnosis Coding ?Follow-up Appointments ?o Return Appointment in 1 week. ?o Nurse Visit as needed - twice a week ?Bathing/ Shower/ Hygiene ?o Clean wound with Normal Saline or wound cleanser. ?o May shower with wound dressing protected with water repellent cover or cast protector. ?Edema Control - Lymphedema / Segmental Compressive Device / Other ?o Optional: One layer of unna paste to top of compression wrap (to act as an anchor). ?o Elevate, Exercise Daily and Avoid Standing for Long Periods of Time. ?o Elevate legs to the level of the heart and pump ankles as often as possible ?o Elevate leg(s) parallel to the floor when sitting. ?Wound Treatment ?Wound #1 - Lower Leg Wound Laterality: Left, Lateral ?Cleanser: Wound Cleanser 3 x Per Week/30 Days ?Discharge Instructions: Wash your hands with soap and water. Remove old dressing, discard into plastic bag and place into trash. ?Cleanse the wound with Wound Cleanser prior to applying a clean dressing using gauze sponges, not tissues or cotton balls. Do not ?scrub or use excessive force. Pat dry using gauze sponges, not tissue or cotton balls. ?Topical: Clobetasol Propionate ointment 0.05%, 60 (g) tube 3 x Per Week/30 Days ?Discharge Instructions: Pt to bring to appt- ?Primary Dressing: Hydrofera Blue Ready Transfer Foam, 4x5 (in/in) 3 x Per Week/30 Days ?Discharge Instructions: Apply Hydrofera Blue Ready to wound bed as directed ?Secondary Dressing: (NON-BORDER) Zetuvit Plus Silicone  NON-BORDER 5x5 (in/in) 3 x Per Week/30 Days ?Compression Wrap: Medichoice 4 layer Compression System, 35-40 mmHG (Generic) 3 x Per Week/30 Days ?Discharge Instructions: Apply multi-layer wrap as directed. ?Electronic Signature(s) ?Signed: 08/10/2021 2:33:48 PM By: Donnamarie Poag ?Signed: 08/10/2021 5:58:01 PM By: Worthy Keeler PA-C ?Entered ByDonnamarie Poag on 08/10/2021 08:22:15 ?[name], [name] ([id number]) ?-------------------------------------------------------------------------------- ?SuperBill Details ?Patient Name: [name], [name] ?Date of Service: 08/10/2021 ?Medical Record Number: [id number] ?Patient Account Number: [id number] ?Date of Birth/Sex: Jun 13, 1980 (41 y.o. M) ?Treating RN: Donnamarie Poag ?Primary Care Provider: [name] Other Clinician: ?Referring Provider: [name] ?Treating Provider/Extender: Jeri Cos ?Weeks in Treatment: 244 ?Diagnosis Coding ?ICD-10 Codes ?Code Description ?I87.2 Venous insufficiency (chronic) (peripheral) ?T01.779 Non-pressure chronic ulcer of left calf with fat layer exposed ?E11.622 Type 2 diabetes mellitus with other skin ulcer ?L88 Pyoderma gangrenosum ?F17.208 Nicotine dependence, unspecified, with other nicotine-induced disorders ?Facility Procedures ?CPT4 Code: 39030092 ?Description: (Facility Use Only) [phone] - APPLY MULTLAY COMPRS LWR LT LEG ?Modifier: ?Quantity: 1 ?Electronic Signature(s) ?Signed: 08/10/2021 2:33:48 PM By: Donnamarie Poag ?Signed: 08/10/2021 5:58:01 PM By: Worthy Keeler PA-C ?Entered ByDonnamarie Poag on 08/10/2021 08:23:18 ?

## 2021-08-12 ENCOUNTER — Other Ambulatory Visit: Payer: Self-pay | Admitting: Primary Care

## 2021-08-12 DIAGNOSIS — E1165 Type 2 diabetes mellitus with hyperglycemia: Secondary | ICD-10-CM

## 2021-08-13 ENCOUNTER — Other Ambulatory Visit: Payer: Self-pay

## 2021-08-13 ENCOUNTER — Encounter: Payer: 59 | Admitting: Physician Assistant

## 2021-08-13 DIAGNOSIS — I872 Venous insufficiency (chronic) (peripheral): Secondary | ICD-10-CM | POA: Diagnosis not present

## 2021-08-13 NOTE — Progress Notes (Signed)
BUSH, MURDOCH ([id number]) ?Visit Report for 08/13/2021 ?Chief Complaint Document Details ?Patient Name: AMAAN, MEYER ?Date of Service: 08/13/2021 8:15 AM ?Medical Record Number: [id number] ?Patient Account Number: [id number] ?Date of Birth/Sex: 1980/11/01 (41 y.o. M) ?Treating RN: Levora Dredge ?Primary Care Provider: [name] Other Clinician: ?Referring Provider: [name] ?Treating Provider/Extender: Jeri Cos ?Weeks in Treatment: 244 ?Information Obtained from: Patient ?Chief Complaint ?He is here in follow up evaluation for LLE pyoderma ulcer ?Electronic Signature(s) ?Signed: 08/13/2021 3:51:56 PM By: Worthy Keeler PA-C ?Entered By: Worthy Keeler on 08/13/2021 08:17:54 ?VICTORIA, EUCEDA ([id number]) ?-------------------------------------------------------------------------------- ?HPI Details ?Patient Name: TEMILOLUWA, LAREDO ?Date of Service: 08/13/2021 8:15 AM ?Medical Record Number: [id number] ?Patient Account Number: [id number] ?Date of Birth/Sex: 10-06-1980 (41 y.o. M) ?Treating RN: Levora Dredge ?Primary Care Provider: [name] Other Clinician: ?Referring Provider: [name] ?Treating Provider/Extender: Jeri Cos ?Weeks in Treatment: 244 ?History of Present Illness ?HPI Description: 12/04/16; 41 year old man who comes into the clinic today for review of a wound on the posterior left calf. He tells me that is ?been there for about a year. He is not a diabetic he does smoke half a pack per day. He was seen in the ER on 11/20/16 felt to have cellulitis around ?the wound and was given clindamycin. An x-ray did not show osteomyelitis. The patient initially tells me that he has a milk allergy that sets off a ?pruritic itching rash on his lower legs which she scratches incessantly and he thinks that's what may have set up the wound. He has been using ?various topical antibiotics and ointments without any effect. He works in a [employer]ing [employer] and is on his feet all day. He does not have a  prior ?history of wounds however he does have the rash on both lower legs the right arm and the ventral aspect of his left arm. These are excoriations ?and clearly have had scratching however there are of macular looking areas on both legs including a substantial larger area on the right leg. This ?does not have an underlying open area. There is no blistering. 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 He seemed to recognize this. On further questioning he admits ?to a 5 year history of quiesced. ulcerative colitis. He is not in any treatment for this. He's had no recent travel ?12/11/16; the patient arrives today with his wound and roughly the same condition we've been using silver alginate this is a deep punched out ?wound with some surrounding erythema but no tenderness. 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 I'm really not able to understand this ?We have also received information from the patient's dermatologist in [place] notes from April 2016. This was a doctor Agarwal-antal. 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 He was prescribed topical steroid high potency under occlusion which helped but at this point the ?patient did not have a deep punched out wound. ?12/18/16; the patient's wound is larger in terms of surface area however this surface looks better and there is less depth. The surrounding erythema ?also is better. The patient states that the wrap we put on came off 2 days ago when he has been using his compression stockings.  He we are in ?the process of getting a dermatology consult. ?12/26/16 on evaluation today patient's left lower extremity wound shows evidence of  infection with surrounding erythema noted. He has been ?tolerating the dressing changes but states that he has noted more discomfort. There is a larger area of erythema surrounding the wound. No ?fevers, chills, nausea, or vomiting noted at this time. With that being said the wound still does have slough covering the surface. He is not allergic ?to any medication that he is aware of at this point. In regard to his right lower extremity he had several regions that are erythematous and ?pruritic he wonders if there's anything we can do to help that. ?01/02/17 I reviewed patient's wound culture which was obtained his visit last week. He was placed on doxycycline at that point. Unfortunately that ?does not appear to be an antibiotic that would likely help with the situation however the pseudomonas noted on culture is sensitive to Cipro. Also ?unfortunately patient's wound seems to have a large compared to last week's evaluation. Not severely so but there are definitely increased ?measurements in general. He is continuing to have discomfort as well he writes this to be a seven out of 10. In fact he would prefer me not to ?perform any debridement today due to the fact that he is having discomfort and considering he has an active infection on the little reluctant to do ?so anyway. No fevers, chills, nausea, or vomiting noted at this time. ?01/08/17; patient seems dermatology on September 5. I suspect dermatology will want the slides from the biopsy I did sent to their pathologist. ?I'm not sure if there is a way we can expedite that. In any case the culture I did before I left on vacation 3 weeks ago showed Pseudomonas he ?was given 10 days of Cipro and per her description of her intake nurses is actually somewhat better this week although the wound is quite a bit ?bigger than I remember the last time I saw this. He still has 3 more days of Cipro ?01/21/17; dermatology appointment tomorrow. He has completed the ciprofloxacin  for Pseudomonas. Surface of the wound looks better however he ?is had some deterioration in the lesions on his right leg. Meantime the left lateral leg wound we will continue with sample ?01/29/17; patient had his dermatology appointment but I can't yet see that note. He is completed his antibiotics. The wound is more superficial but ?considerably larger in circumferential area than when he came in. This is in his left lateral calf. He also has swollen erythematous areas with ?superficial wounds on the right leg and small papular areas on both arms. There apparently areas in her his upper thighs and buttocks I did not ?look at those. Dermatology biopsied the right leg. Hopefully will have their input next week. ?02/05/17; patient went back to see his dermatologist who told him that he had a "scratching problem" as well as staph. He is now on a 30 day ?course of doxycycline and I believe she gave him triamcinolone cream to the right leg areas to help with the itching [not exactly sure but probably ?triamcinolone]. She apparently looked at the left lateral leg wound although this was not rebiopsied and I think felt to be ultimately part of the ?same pathogenesis. He is using sample border foam and changing nevus himself. He now has a new open area on the right posterior leg which ?was his biopsy site ?I don't have any of the dermatology notes ?  02/12/17; we put the patient in compression last week with SANTYL to the wound on the left leg and the biopsy. Edema is much better and the ?depth of the wound is now at level of skin. Area is still the same ?oBiopsy site on the right lateral leg we've also been using santyl with a border foam dressing and he is changing this himself. ?02/19/17; Using silver alginate started last week to both the substantial left leg wound and the biopsy site on the right wound. He is tolerating ?compression well. Has a an appointment with his primary M.D. tomorrow wondering about diuretics although  I'm wondering if the edema problem ?is actually lymphedema ?02/26/17; the patient has been to see his primary doctor Dr. Jerrel Ivory at [facility] our primary care. She started him on Lasix 20 mg and this ?seem

## 2021-08-13 NOTE — Progress Notes (Signed)
MILFRED, KRAMMES ([id number]) ?Visit Report for 08/13/2021 ?Arrival Information Details ?Patient Name: [name], [name] ?Date of Service: 08/13/2021 8:15 AM ?Medical Record Number: [id number] ?Patient Account Number: [id number] ?Date of Birth/Sex: 02-15-1981 (41 y.o. M) ?Treating RN: Levora Dredge ?Primary Care Wrigley Plasencia: [name] Other Clinician: ?Referring Lola Czerwonka: [name] ?Treating Cookie Pore/Extender: Jeri Cos ?Weeks in Treatment: 244 ?Visit Information History Since Last Visit ?Added or deleted any medications: No ?Patient Arrived: Ambulatory ?Any new allergies or adverse reactions: No ?Arrival Time: 08:06 ?Had a fall or experienced change in No ?Accompanied By: self ?activities of daily living that may affect ?Transfer Assistance: None ?risk of falls: ?Patient Identification Verified: Yes ?Hospitalized since last visit: No ?Secondary Verification Process Completed: Yes ?Has Dressing in Place as Prescribed: Yes ?Patient Requires Transmission-Based No ?Has Compression in Place as Prescribed: Yes ?Precautions: ?Pain Present Now: No ?Patient Has Alerts: Yes ?Patient Alerts: Patient has reaction ?to ?silver dressings. ?Electronic Signature(s) ?Signed: 08/13/2021 3:21:13 PM By: Levora Dredge ?Entered By: Levora Dredge on 08/13/2021 08:10:00 ?[name], [name] ([id number]) ?-------------------------------------------------------------------------------- ?Clinic Level of Care Assessment Details ?Patient Name: [name], [name] ?Date of Service: 08/13/2021 8:15 AM ?Medical Record Number: [id number] ?Patient Account Number: [id number] ?Date of Birth/Sex: 1980/08/11 (41 y.o. M) ?Treating RN: Levora Dredge ?Primary Care Beverley Allender: [name] Other Clinician: ?Referring Champion Corales: [name] ?Treating Panhia Karl/Extender: Jeri Cos ?Weeks in Treatment: 244 ?Clinic Level of Care Assessment Items ?TOOL 1 Quantity Score ?[]  - Use when EandM and Procedure is performed on INITIAL visit 0 ?ASSESSMENTS - Nursing  Assessment / Reassessment ?[]  - General Physical Exam (combine w/ comprehensive assessment (listed just below) when performed on new ?0 ?pt. evals) ?[]  - 0 ?Comprehensive Assessment (HX, ROS, Risk Assessments, Wounds Hx, etc.) ?ASSESSMENTS - Wound and Skin Assessment / Reassessment ?[]  - Dermatologic / Skin Assessment (not related to wound area) 0 ?ASSESSMENTS - Ostomy and/or Continence Assessment and Care ?[]  - Incontinence Assessment and Management 0 ?[]  - 0 ?Ostomy Care Assessment and Management (repouching, etc.) ?PROCESS - Coordination of Care ?[]  - Simple Patient / Family Education for ongoing care 0 ?[]  - 0 ?Complex (extensive) Patient / Family Education for ongoing care ?[]  - 0 ?Staff obtains Consents, Records, Test Results / Process Orders ?[]  - 0 ?Staff telephones HHA, Nursing Homes / Clarify orders / etc ?[]  - 0 ?Routine Transfer to another Facility (non-emergent condition) ?[]  - 0 ?Routine Hospital Admission (non-emergent condition) ?[]  - 0 ?New Admissions / Biomedical engineer / Ordering NPWT, Apligraf, etc. ?[]  - 0 ?Emergency Hospital Admission (emergent condition) ?PROCESS - Special Needs ?[]  - Pediatric / Minor Patient Management 0 ?[]  - 0 ?Isolation Patient Management ?[]  - 0 ?Hearing / Language / Visual special needs ?[]  - 0 ?Assessment of Community assistance (transportation, D/C planning, etc.) ?[]  - 0 ?Additional assistance / Altered mentation ?[]  - 0 ?Support Surface(s) Assessment (bed, cushion, seat, etc.) ?INTERVENTIONS - Miscellaneous ?[]  - External ear exam 0 ?[]  - 0 ?Patient Transfer (multiple staff / Civil Service fast streamer / Similar devices) ?[]  - 0 ?Simple Staple / Suture removal (25 or less) ?[]  - 0 ?Complex Staple / Suture removal (26 or more) ?[]  - 0 ?Hypo/Hyperglycemic Management (do not check if billed separately) ?[]  - 0 ?Ankle / Brachial Index (ABI) - do not check if billed separately ?Has the patient been seen at the hospital within the last three years: Yes ?Total Score: 0 ?Level Of  Care: ____ ?[name], [name] ([id number]) ?Electronic Signature(s) ?Signed: 08/13/2021 3:21:13 PM By: Levora Dredge ?Entered By: Levora Dredge on 08/13/2021 09:27:37 ?[name], [name] ([id number]) ?-------------------------------------------------------------------------------- ?  Compression Therapy Details ?Patient Name: [name], [name] ?Date of Service: 08/13/2021 8:15 AM ?Medical Record Number: [id number] ?Patient Account Number: [id number] ?Date of Birth/Sex: 1981-02-23 (41 y.o. M) ?Treating RN: Levora Dredge ?Primary Care Lenville Hibberd: [name] Other Clinician: ?Referring Brentton Wardlow: [name] ?Treating Clarene Curran/Extender: Jeri Cos ?Weeks in Treatment: 244 ?Compression Therapy Performed for Wound Assessment: Wound #1 Left,Lateral Lower Leg ?Performed By: Clinician Levora Dredge, RN ?Compression Type: Four Layer ?Pre Treatment ABI: 1.2 ?Post Procedure Diagnosis ?Same as Pre-procedure ?Electronic Signature(s) ?Signed: 08/13/2021 9:26:48 AM By: Levora Dredge ?Entered By: Levora Dredge on 08/13/2021 09:26:48 ?[name], [name] ([id number]) ?-------------------------------------------------------------------------------- ?Encounter Discharge Information Details ?Patient Name: [name], [name] ?Date of Service: 08/13/2021 8:15 AM ?Medical Record Number: [id number] ?Patient Account Number: [id number] ?Date of Birth/Sex: February 14, 1981 (41 y.o. M) ?Treating RN: Levora Dredge ?Primary Care Jonie Burdell: [name] Other Clinician: ?Referring Ayven Pheasant: [name] ?Treating Jazzmyn Filion/Extender: Jeri Cos ?Weeks in Treatment: 244 ?Encounter Discharge Information Items ?Discharge Condition: Stable ?Ambulatory Status: Ambulatory ?Discharge Destination: Home ?Transportation: Private Auto ?Accompanied By: self ?Schedule Follow-up Appointment: Yes ?Clinical Summary of Care: ?Electronic Signature(s) ?Signed: 08/13/2021 9:29:19 AM By: Levora Dredge ?Entered By: Levora Dredge on 08/13/2021 09:29:19 ?[name], [name]  ([id number]) ?-------------------------------------------------------------------------------- ?Lower Extremity Assessment Details ?Patient Name: [name], [name] ?Date of Service: 08/13/2021 8:15 AM ?Medical Record Number: [id number] ?Patient Account Number: [id number] ?Date of Birth/Sex: 09/11/1980 (41 y.o. M) ?Treating RN: Levora Dredge ?Primary Care Veatrice Eckstein: [name] Other Clinician: ?Referring Brees Hounshell: [name] ?Treating Alveena Taira/Extender: Jeri Cos ?Weeks in Treatment: 244 ?Edema Assessment ?Assessed: [Left: No] [Right: No] ?Edema: [Left: Ye] [Right: s] ?Calf ?Left: Right: ?Point of Measurement: 35 cm From Medial Instep 47.4 cm ?Ankle ?Left: Right: ?Point of Measurement: 10 cm From Medial Instep 29.5 cm ?Vascular Assessment ?Pulses: ?Dorsalis Pedis ?Palpable: [Left:Yes] ?Electronic Signature(s) ?Signed: 08/13/2021 3:21:13 PM By: Levora Dredge ?Entered By: Levora Dredge on 08/13/2021 08:18:47 ?[name], WHOBREY ([id number]) ?-------------------------------------------------------------------------------- ?Multi Wound Chart Details ?Patient Name: DINNIS, ROG ?Date of Service: 08/13/2021 8:15 AM ?Medical Record Number: [id number] ?Patient Account Number: [id number] ?Date of Birth/Sex: 09-Sep-1980 (41 y.o. M) ?Treating RN: Levora Dredge ?Primary Care Aleeha Boline: [name] Other Clinician: ?Referring Marielena Harvell: [name] ?Treating Areej Tayler/Extender: Jeri Cos ?Weeks in Treatment: 244 ?Vital Signs ?Height(in): 71 ?Pulse(bpm): 80 ?Weight(lbs): 338 ?Blood Pressure(mmHg): 132/86 ?Body Mass Index(BMI): 47.1 ?Temperature(??F): 97.9 ?Respiratory Rate(breaths/min): 18 ?Photos: [N/A:N/A] ?Wound Location: Left, Lateral Lower Leg N/A N/A ?Wounding Event: Gradually Appeared N/A N/A ?Primary Etiology: Pyoderma N/A N/A ?Comorbid History: Sleep Apnea, Hypertension, Colitis N/A N/A ?Date Acquired: 11/18/2015 N/A N/A ?Weeks of Treatment: 244 N/A N/A ?Wound Status: Open N/A N/A ?Wound Recurrence: No N/A  N/A ?Measurements L x W x D (cm) 3.2x2x0.3 N/A N/A ?Area (cm?) : 5.027 N/A N/A ?Volume (cm?) : 1.508 N/A N/A ?% Reduction in Area: -2.40% N/A N/A ?% Reduction in Volume: 61.60% N/A N/A ?Classification: Full Thickness With Exposed N/A N/

## 2021-08-14 NOTE — Telephone Encounter (Signed)
Called patient states that transmitters only last 3 months and his expires in 10 days.  ?

## 2021-08-14 NOTE — Telephone Encounter (Signed)
Does he actually need a new Dexcom transmitter? ?

## 2021-08-15 ENCOUNTER — Other Ambulatory Visit: Payer: Self-pay

## 2021-08-15 DIAGNOSIS — I872 Venous insufficiency (chronic) (peripheral): Secondary | ICD-10-CM | POA: Diagnosis not present

## 2021-08-15 NOTE — Progress Notes (Signed)
[name], [name] ([id number]) ?Visit Report for 08/15/2021 ?Physician Orders Details ?Patient Name: [name], [name] ?Date of Service: 08/15/2021 8:00 AM ?Medical Record Number: [id number] ?Patient Account Number: [id number] ?Date of Birth/Sex: Oct 22, 1980 (41 y.o. M) ?Treating RN: Donnamarie Poag ?Primary Care Provider: [name] Other Clinician: ?Referring Provider: [name] ?Treating Provider/Extender: Kalman Shan ?Weeks in Treatment: 245 ?Verbal / Phone Orders: No ?Diagnosis Coding ?Follow-up Appointments ?o Return Appointment in 1 week. ?o Nurse Visit as needed - twice a week ?Bathing/ Shower/ Hygiene ?o Clean wound with Normal Saline or wound cleanser. ?o May shower with wound dressing protected with water repellent cover or cast protector. ?Edema Control - Lymphedema / Segmental Compressive Device / Other ?o Optional: One layer of unna paste to top of compression wrap (to act as an anchor). ?o Elevate, Exercise Daily and Avoid Standing for Long Periods of Time. ?o Elevate legs to the level of the heart and pump ankles as often as possible ?o Elevate leg(s) parallel to the floor when sitting. ?Wound Treatment ?Wound #1 - Lower Leg Wound Laterality: Left, Lateral ?Cleanser: Wound Cleanser 3 x Per Week/30 Days ?Discharge Instructions: Wash your hands with soap and water. Remove old dressing, discard into plastic bag and place into trash. ?Cleanse the wound with Wound Cleanser prior to applying a clean dressing using gauze sponges, not tissues or cotton balls. Do not ?scrub or use excessive force. Pat dry using gauze sponges, not tissue or cotton balls. ?Topical: Clobetasol Propionate ointment 0.05%, 60 (g) tube 3 x Per Week/30 Days ?Discharge Instructions: Pt to bring to appt- ?Primary Dressing: Hydrofera Blue Ready Transfer Foam, 4x5 (in/in) 3 x Per Week/30 Days ?Discharge Instructions: Apply Hydrofera Blue Ready to wound bed as directed ?Secondary Dressing: (NON-BORDER) Zetuvit Plus Silicone  NON-BORDER 5x5 (in/in) 3 x Per Week/30 Days ?Compression Wrap: Medichoice 4 layer Compression System, 35-40 mmHG (Generic) 3 x Per Week/30 Days ?Discharge Instructions: Apply multi-layer wrap as directed. ?Electronic Signature(s) ?Signed: 08/15/2021 8:57:25 AM By: Kalman Shan DO ?Signed: 08/15/2021 3:52:05 PM By: Donnamarie Poag ?Entered ByDonnamarie Poag on 08/15/2021 08:23:00 ?[name], [name] ([id number]) ?-------------------------------------------------------------------------------- ?SuperBill Details ?Patient Name: [name], [name] ?Date of Service: 08/15/2021 ?Medical Record Number: [id number] ?Patient Account Number: [id number] ?Date of Birth/Sex: 1981/04/29 (41 y.o. 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

## 2021-08-15 NOTE — Progress Notes (Signed)
PARKE, JANDREAU ([id number]) ?Visit Report for 08/15/2021 ?Arrival Information Details ?Patient Name: [name], [name] ?Date of Service: 08/15/2021 8:00 AM ?Medical Record Number: [id number] ?Patient Account Number: [id number] ?Date of Birth/Sex: [birth year] (41 y.o. M) ?Treating RN: Donnamarie Poag ?Primary Care Pedrohenrique Mcconville: [name] Other Clinician: ?Referring Andrus Sharp: [name] ?Treating Alika Eppes/Extender: Kalman Shan ?Weeks in Treatment: 245 ?Visit Information History Since Last Visit ?Added or deleted any medications: No ?Patient Arrived: Ambulatory ?Had a fall or experienced change in No ?Arrival Time: 08:02 ?activities of daily living that may affect ?Accompanied By: self ?risk of falls: ?Transfer Assistance: None ?Hospitalized since last visit: No ?Patient Identification Verified: Yes ?Has Dressing in Place as Prescribed: Yes ?Secondary Verification Process Completed: Yes ?Has Compression in Place as Prescribed: Yes ?Patient Requires Transmission-Based No ?Pain Present Now: No ?Precautions: ?Patient Has Alerts: Yes ?Patient Alerts: Patient has reaction ?to ?silver dressings. ?Electronic Signature(s) ?Signed: 08/15/2021 3:52:05 PM By: Donnamarie Poag ?Entered ByDonnamarie Poag on 08/15/2021 08:20:34 ?[name], [name] ([id number]) ?-------------------------------------------------------------------------------- ?Compression Therapy Details ?Patient Name: [name], [name] ?Date of Service: 08/15/2021 8:00 AM ?Medical Record Number: [id number] ?Patient Account Number: [id number] ?Date of Birth/Sex: 05/19/1981 (41 y.o. M) ?Treating RN: Donnamarie Poag ?Primary Care Tameeka Luo: [name] Other Clinician: ?Referring Sarajean Dessert: [name] ?Treating Wirt Hemmerich/Extender: Kalman Shan ?Weeks in Treatment: 245 ?Compression Therapy Performed for Wound Assessment: Wound #1 Left,Lateral Lower Leg ?Performed By: Clinician Donnamarie Poag, RN ?Compression Type: Four Layer ?Electronic Signature(s) ?Signed: 08/15/2021 3:52:05 PM By: Donnamarie Poag ?Entered ByDonnamarie Poag on 08/15/2021 08:21:17 ?[name], [name] ([id number]) ?-------------------------------------------------------------------------------- ?Encounter Discharge Information Details ?Patient Name: [name], [name] ?Date of Service: 08/15/2021 8:00 AM ?Medical Record Number: [id number] ?Patient Account Number: [id number] ?Date of Birth/Sex: 1980-10-19 (41 y.o. M) ?Treating RN: Donnamarie Poag ?Primary Care Tung Pustejovsky: [name] Other Clinician: ?Referring Aleesha Ringstad: [name] ?Treating Russie Gulledge/Extender: Kalman Shan ?Weeks in Treatment: 245 ?Encounter Discharge Information Items ?Discharge Condition: Stable ?Ambulatory Status: Ambulatory ?Discharge Destination: Home ?Transportation: Private Auto ?Accompanied By: self ?Schedule Follow-up Appointment: Yes ?Clinical Summary of Care: ?Electronic Signature(s) ?Signed: 08/15/2021 3:52:05 PM By: Donnamarie Poag ?Entered ByDonnamarie Poag on 08/15/2021 08:23:50 ?[name], [name] ([id number]) ?-------------------------------------------------------------------------------- ?Wound Assessment Details ?Patient Name: [name], [name] ?Date of Service: 08/15/2021 8:00 AM ?Medical Record Number: [id number] ?Patient Account Number: [id number] ?Date of Birth/Sex: [birth year] (41 y.o. M) ?Treating RN: Donnamarie Poag ?Primary Care Rawley Harju: [name] Other Clinician: ?Referring Tamyah Cutbirth: [name] ?Treating Avani Sensabaugh/Extender: Kalman Shan ?Weeks in Treatment: 245 ?Wound Status ?Wound Number: 1 ?Primary Etiology: Pyoderma ?Wound Location: Left, Lateral Lower Leg ?Wound Status: Open ?Wounding Event: Gradually Appeared ?Comorbid History: Sleep Apnea, Hypertension, Colitis ?Date Acquired: 11/18/2015 ?Weeks Of Treatment: 245 ?Clustered Wound: No ?Wound Measurements ?Length: (cm) 3.2 ?Width: (cm) 2 ?Depth: (cm) 0.3 ?Area: (cm?) 5.027 ?Volume: (cm?) 1.508 ?% Reduction in Area: -2.4% ?% Reduction in Volume: 61.6% ?Epithelialization: Medium (34-66%) ?Wound  Description ?Classification: Full Thickness With Exposed Support Structures ?Wound Margin: Flat and Intact ?Exudate Amount: Medium ?Exudate Type: Serosanguineous ?Exudate Color: red, brown ?Foul Odor After Cleansing: No ?Slough/Fibrino Yes ?Wound Bed ?Granulation Amount: Large (67-100%) Exposed Structure ?Granulation Quality: Red, Pink ?Fascia Exposed: No ?Necrotic Amount: Small (1-33%) ?Fat Layer (Subcutaneous Tissue) Exposed: Yes ?Necrotic Quality: Adherent Slough ?Tendon Exposed: No ?Muscle Exposed: No ?Joint Exposed: No ?Bone Exposed: No ?Treatment Notes ?Wound #1 (Lower Leg) Wound Laterality: Left, Lateral ?Cleanser ?Wound Cleanser ?Discharge Instruction: Wash your hands with soap and water. Remove old dressing, discard into plastic bag and place into trash. ?Cleanse the wound with Wound Cleanser prior to applying a clean dressing using gauze sponges,  not tissues or cotton balls. Do ?not scrub or use excessive force. Pat dry using gauze sponges, not tissue or cotton balls. ?Peri-Wound Care ?Topical ?Clobetasol Propionate ointment 0.05%, 60 (g) tube ?Discharge Instruction: Pt to bring to appt- ?Primary Dressing ?Hydrofera Blue Ready Transfer Foam, 4x5 (in/in) ?Discharge Instruction: Apply Hydrofera Blue Ready to wound bed as directed ?Secondary Dressing ?(NON-BORDER) Zetuvit Plus Silicone NON-BORDER 5x5 (in/in) ?Secured With ?[name], [name] ([id number]) ?Compression Wrap ?Medichoice 4 layer Compression System, 35-40 mmHG ?Discharge Instruction: Apply multi-layer wrap as directed. ?Compression Stockings ?Add-Ons ?Electronic Signature(s) ?Signed: 08/15/2021 3:52:05 PM By: Donnamarie Poag ?Entered ByDonnamarie Poag on 08/15/2021 08:20:56 ?

## 2021-08-17 DIAGNOSIS — I872 Venous insufficiency (chronic) (peripheral): Secondary | ICD-10-CM | POA: Diagnosis not present

## 2021-08-17 NOTE — Progress Notes (Signed)
[name], [name] ([id number]) ?Visit Report for 08/17/2021 ?Arrival Information Details ?Patient Name: [name], [name] ?Date of Service: 08/17/2021 8:00 AM ?Medical Record Number: [id number] ?Patient Account Number: [id number] ?Date of Birth/Sex: April 17, 1981 (41 y.o. M) ?Treating RN: Donnamarie Poag ?Primary Care Amaziah [name]: [name] Other Clinician: ?Referring Marshawn Normoyle: [name] ?Treating Kimble Hitchens/Extender: Jeri Cos ?Weeks in Treatment: 245 ?Visit Information History Since Last Visit ?Added or deleted any medications: No ?Patient Arrived: Ambulatory ?Had a fall or experienced change in No ?Arrival Time: 08:01 ?activities of daily living that may affect ?Accompanied By: self ?risk of falls: ?Transfer Assistance: EasyP[employer] Patient Lift ?Hospitalized since last visit: No ?Patient Identification Verified: Yes ?Has Dressing in Place as Prescribed: Yes ?Secondary Verification Process Completed: Yes ?Has Compression in Place as Prescribed: Yes ?Patient Requires Transmission-Based No ?Pain Present Now: No ?Precautions: ?Patient Has Alerts: Yes ?Patient Alerts: Patient has reaction ?to ?silver dressings. ?Electronic Signature(s) ?Signed: 08/17/2021 1:04:51 PM By: Donnamarie Poag ?Entered ByDonnamarie Poag on 08/17/2021 08:15:46 ?[name], [name] ([id number]) ?-------------------------------------------------------------------------------- ?Compression Therapy Details ?Patient Name: [name], [name] ?Date of Service: 08/17/2021 8:00 AM ?Medical Record Number: [id number] ?Patient Account Number: [id number] ?Date of Birth/Sex: July 06, 1980 (41 y.o. M) ?Treating RN: Donnamarie Poag ?Primary Care Jaydin Jalomo: [name] Other Clinician: ?Referring Charlett Merkle: [name] ?Treating Codie Krogh/Extender: Jeri Cos ?Weeks in Treatment: 245 ?Compression Therapy Performed for Wound Assessment: Wound #1 Left,Lateral Lower Leg ?Performed By: Clinician Donnamarie Poag, RN ?Compression Type: Four Layer ?Electronic Signature(s) ?Signed: 08/17/2021 1:04:51 PM By:  Donnamarie Poag ?Entered ByDonnamarie Poag on 08/17/2021 08:16:27 ?[name], [name] ([id number]) ?-------------------------------------------------------------------------------- ?Encounter Discharge Information Details ?Patient Name: [name], [name] ?Date of Service: 08/17/2021 8:00 AM ?Medical Record Number: [id number] ?Patient Account Number: [id number] ?Date of Birth/Sex: 1981/02/05 (41 y.o. M) ?Treating RN: Donnamarie Poag ?Primary Care Artin Mceuen: [name] Other Clinician: ?Referring Wray Goehring: [name] ?Treating Komal Stangelo/Extender: Jeri Cos ?Weeks in Treatment: 245 ?Encounter Discharge Information Items ?Discharge Condition: Stable ?Ambulatory Status: Ambulatory ?Discharge Destination: Home ?Transportation: Private Auto ?Accompanied By: self ?Schedule Follow-up Appointment: Yes ?Clinical Summary of Care: ?Electronic Signature(s) ?Signed: 08/17/2021 1:04:51 PM By: Donnamarie Poag ?Entered ByDonnamarie Poag on 08/17/2021 08:18:10 ?[name], [name] ([id number]) ?-------------------------------------------------------------------------------- ?Wound Assessment Details ?Patient Name: [name], [name] ?Date of Service: 08/17/2021 8:00 AM ?Medical Record Number: [id number] ?Patient Account Number: [id number] ?Date of Birth/Sex: 1981/04/15 (41 y.o. M) ?Treating RN: Donnamarie Poag ?Primary Care Kalden Wanke: [name] Other Clinician: ?Referring Tarshia Kot: [name] ?Treating Megin Consalvo/Extender: Jeri Cos ?Weeks in Treatment: 245 ?Wound Status ?Wound Number: 1 ?Primary Etiology: Pyoderma ?Wound Location: Left, Lateral Lower Leg ?Wound Status: Open ?Wounding Event: Gradually Appeared ?Comorbid History: Sleep Apnea, Hypertension, Colitis ?Date Acquired: 11/18/2015 ?Weeks Of Treatment: 245 ?Clustered Wound: No ?Wound Measurements ?Length: (cm) 3.2 ?Width: (cm) 2 ?Depth: (cm) 0.3 ?Area: (cm?) 5.027 ?Volume: (cm?) 1.508 ?% Reduction in Area: -2.4% ?% Reduction in Volume: 61.6% ?Epithelialization: Medium (34-66%) ?Wound  Description ?Classification: Full Thickness With Exposed Support Structures ?Wound Margin: Flat and Intact ?Exudate Amount: Medium ?Exudate Type: Serosanguineous ?Exudate Color: red, brown ?Foul Odor After Cleansing: No ?Slough/Fibrino Yes ?Wound Bed ?Granulation Amount: Large (67-100%) Exposed Structure ?Granulation Quality: Red, Pink ?Fascia Exposed: No ?Necrotic Amount: Small (1-33%) ?Fat Layer (Subcutaneous Tissue) Exposed: Yes ?Necrotic Quality: Adherent Slough ?Tendon Exposed: No ?Muscle Exposed: No ?Joint Exposed: No ?Bone Exposed: No ?Treatment Notes ?Wound #1 (Lower Leg) Wound Laterality: Left, Lateral ?Cleanser ?Wound Cleanser ?Discharge Instruction: Wash your hands with soap and water. Remove old dressing, discard into plastic bag and place into trash. ?Cleanse the wound with Wound Cleanser prior to applying a clean dressing using  gauze sponges, not tissues or cotton balls. Do ?not scrub or use excessive force. Pat dry using gauze sponges, not tissue or cotton balls. ?Peri-Wound Care ?Topical ?Clobetasol Propionate ointment 0.05%, 60 (g) tube ?Discharge Instruction: Pt to bring to appt- ?Primary Dressing ?Hydrofera Blue Ready Transfer Foam, 4x5 (in/in) ?Discharge Instruction: Apply Hydrofera Blue Ready to wound bed as directed ?Secondary Dressing ?(NON-BORDER) Zetuvit Plus Silicone NON-BORDER 5x5 (in/in) ?Secured With ?[name], [name] ([id number]) ?Compression Wrap ?Medichoice 4 layer Compression System, 35-40 mmHG ?Discharge Instruction: Apply multi-layer wrap as directed. ?Compression Stockings ?Add-Ons ?Electronic Signature(s) ?Signed: 08/17/2021 1:04:51 PM By: Donnamarie Poag ?Entered ByDonnamarie Poag on 08/17/2021 08:16:06 ?

## 2021-08-17 NOTE — Progress Notes (Signed)
[name], [name] ([id number]) ?Visit Report for 08/17/2021 ?Physician Orders Details ?Patient Name: [name], [name] ?Date of Service: 08/17/2021 8:00 AM ?Medical Record Number: [id number] ?Patient Account Number: [id number] ?Date of Birth/Sex: 1980-08-04 (41 y.o. M) ?Treating RN: Donnamarie Poag ?Primary Care Provider: [name] Other Clinician: ?Referring Provider: [name] ?Treating Provider/Extender: Jeri Cos ?Weeks in Treatment: 245 ?Verbal / Phone Orders: No ?Diagnosis Coding ?Follow-up Appointments ?o Return Appointment in 1 week. ?o Nurse Visit as needed - twice a week ?Bathing/ Shower/ Hygiene ?o Clean wound with Normal Saline or wound cleanser. ?o May shower with wound dressing protected with water repellent cover or cast protector. ?Edema Control - Lymphedema / Segmental Compressive Device / Other ?o Optional: One layer of unna paste to top of compression wrap (to act as an anchor). ?o Elevate, Exercise Daily and Avoid Standing for Long Periods of Time. ?o Elevate legs to the level of the heart and pump ankles as often as possible ?o Elevate leg(s) parallel to the floor when sitting. ?Wound Treatment ?Wound #1 - Lower Leg Wound Laterality: Left, Lateral ?Cleanser: Wound Cleanser 3 x Per Week/30 Days ?Discharge Instructions: Wash your hands with soap and water. Remove old dressing, discard into plastic bag and place into trash. ?Cleanse the wound with Wound Cleanser prior to applying a clean dressing using gauze sponges, not tissues or cotton balls. Do not ?scrub or use excessive force. Pat dry using gauze sponges, not tissue or cotton balls. ?Topical: Clobetasol Propionate ointment 0.05%, 60 (g) tube 3 x Per Week/30 Days ?Discharge Instructions: Pt to bring to appt- ?Primary Dressing: Hydrofera Blue Ready Transfer Foam, 4x5 (in/in) 3 x Per Week/30 Days ?Discharge Instructions: Apply Hydrofera Blue Ready to wound bed as directed ?Secondary Dressing: (NON-BORDER) Zetuvit Plus Silicone  NON-BORDER 5x5 (in/in) 3 x Per Week/30 Days ?Compression Wrap: Medichoice 4 layer Compression System, 35-40 mmHG (Generic) 3 x Per Week/30 Days ?Discharge Instructions: Apply multi-layer wrap as directed. ?Electronic Signature(s) ?Signed: 08/17/2021 1:04:51 PM By: Donnamarie Poag ?Signed: 08/17/2021 4:34:33 PM By: Worthy Keeler PA-C ?Entered ByDonnamarie Poag on 08/17/2021 08:17:17 ?[name], [name] ([id number]) ?-------------------------------------------------------------------------------- ?SuperBill Details ?Patient Name: [name], [name] ?Date of Service: 08/17/2021 ?Medical Record Number: [id number] ?Patient Account Number: [id number] ?Date of Birth/Sex: 1980-07-02 (41 y.o. 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

## 2021-08-20 ENCOUNTER — Encounter: Payer: 59 | Attending: Physician Assistant | Admitting: Physician Assistant

## 2021-08-20 DIAGNOSIS — L88 Pyoderma gangrenosum: Secondary | ICD-10-CM | POA: Insufficient documentation

## 2021-08-20 DIAGNOSIS — F17208 Nicotine dependence, unspecified, with other nicotine-induced disorders: Secondary | ICD-10-CM | POA: Insufficient documentation

## 2021-08-20 DIAGNOSIS — L97222 Non-pressure chronic ulcer of left calf with fat layer exposed: Secondary | ICD-10-CM | POA: Insufficient documentation

## 2021-08-20 DIAGNOSIS — E11622 Type 2 diabetes mellitus with other skin ulcer: Secondary | ICD-10-CM | POA: Diagnosis not present

## 2021-08-20 DIAGNOSIS — I872 Venous insufficiency (chronic) (peripheral): Secondary | ICD-10-CM | POA: Diagnosis present

## 2021-08-21 ENCOUNTER — Ambulatory Visit: Payer: 59 | Admitting: Physician Assistant

## 2021-08-21 NOTE — Progress Notes (Signed)
[name], [name] ([id number]) ?Visit Report for 08/20/2021 ?Chief Complaint Document Details ?Patient Name: [name], [name] ?Date of Service: 08/20/2021 8:00 AM ?Medical Record Number: [id number] ?Patient Account Number: [id number] ?Date of Birth/Sex: 04-12-1981 (41 y.o. M) ?Treating RN: [name] ?Primary Care Provider: [name] Other Clinician: ?Referring Provider: [name] ?Treating Provider/Extender: [name] ?Weeks in Treatment: 245 ?Information Obtained from: Patient ?Chief Complaint ?He is here in follow up evaluation for LLE pyoderma ulcer ?Electronic Signature(s) ?Signed: 08/20/2021 4:14:46 PM By: [name] Keeler PA-C ?Entered By: [name] on 08/20/2021 08:21:58 ?[name] ([id number]) ?-------------------------------------------------------------------------------- ?HPI Details ?Patient Name: [name] ?Date of Service: 08/20/2021 8:00 AM ?Medical Record Number: [id number] ?Patient Account Number: [id number] ?Date of Birth/Sex: 26-Mar-1981 (41 y.o. M) ?Treating RN: [name] ?Primary Care Provider: [name] Other Clinician: ?Referring Provider: [name] ?Treating Provider/Extender: [name] ?Weeks in Treatment: 245 ?History of Present Illness ?HPI Description: 12/04/16; 41 year old man who comes into the clinic today for review of a wound on the posterior left calf. He tells me that is ?been there for about a year. He is not a diabetic he does smoke half a pack per day. He was seen in the ER on 11/20/16 felt to have cellulitis around ?the wound and was given clindamycin. An x-ray did not show osteomyelitis. The patient initially tells me that he has a milk allergy that sets off a ?pruritic itching rash on his lower legs which she scratches incessantly and he thinks that's what may have set up the wound. He has been using ?various topical antibiotics and ointments without any effect. He works in a [employer]ing [employer] and is on his feet all day. He does not have a prior ?history of  wounds however he does have the rash on both lower legs the right arm and the ventral aspect of his left arm. These are excoriations ?and clearly have had scratching however there are of macular looking areas on both legs including a substantial larger area on the right leg. This ?does not have an underlying open area. There is no blistering. 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 He seemed to recognize this. On further questioning he admits ?to a 5 year history of quiesced. ulcerative colitis. He is not in any treatment for this. He's had no recent travel ?12/11/16; the patient arrives today with his wound and roughly the same condition we've been using silver alginate this is a deep punched out ?wound with some surrounding erythema but no tenderness. 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 I'm really not able to understand this ?We have also received information from the patient's dermatologist in [place] notes from April 2016. This was a doctor Agarwal-antal. 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 He was prescribed topical steroid high potency under occlusion which helped but at this point the ?patient did not have a deep punched out wound. ?12/18/16; the patient's wound is larger in terms of surface area however this surface looks better and there is less depth. The surrounding erythema ?also is better. The patient states that the wrap we put on came off 2 days ago when he has been using his compression stockings.  He we are in ?the process of getting a dermatology consult. ?12/26/16 on evaluation today patient's left lower extremity wound shows evidence of infection with  surrounding erythema noted. He has been ?tolerating the dressing changes but states that he has noted more discomfort. There is a larger area of erythema surrounding the wound. No ?fevers, chills, nausea, or vomiting noted at this time. With that being said the wound still does have slough covering the surface. He is not allergic ?to any medication that he is aware of at this point. In regard to his right lower extremity he had several regions that are erythematous and ?pruritic he wonders if there's anything we can do to help that. ?01/02/17 I reviewed patient's wound culture which was obtained his visit last week. He was placed on doxycycline at that point. Unfortunately that ?does not appear to be an antibiotic that would likely help with the situation however the pseudomonas noted on culture is sensitive to Cipro. Also ?unfortunately patient's wound seems to have a large compared to last week's evaluation. Not severely so but there are definitely increased ?measurements in general. He is continuing to have discomfort as well he writes this to be a seven out of 10. In fact he would prefer me not to ?perform any debridement today due to the fact that he is having discomfort and considering he has an active infection on the little reluctant to do ?so anyway. No fevers, chills, nausea, or vomiting noted at this time. ?01/08/17; patient seems dermatology on September 5. I suspect dermatology will want the slides from the biopsy I did sent to their pathologist. ?I'm not sure if there is a way we can expedite that. In any case the culture I did before I left on vacation 3 weeks ago showed Pseudomonas he ?was given 10 days of Cipro and per her description of her intake nurses is actually somewhat better this week although the wound is quite a bit ?bigger than I remember the last time I saw this. He still has 3 more days of Cipro ?01/21/17; dermatology appointment tomorrow. He has completed the ciprofloxacin for Pseudomonas.  Surface of the wound looks better however he ?is had some deterioration in the lesions on his right leg. Meantime the left lateral leg wound we will continue with sample ?01/29/17; patient had his dermatology appointment but I can't yet see that note. He is completed his antibiotics. The wound is more superficial but ?considerably larger in circumferential area than when he came in. This is in his left lateral calf. He also has swollen erythematous areas with ?superficial wounds on the right leg and small papular areas on both arms. There apparently areas in her his upper thighs and buttocks I did not ?look at those. Dermatology biopsied the right leg. Hopefully will have their input next week. ?02/05/17; patient went back to see his dermatologist who told him that he had a "scratching problem" as well as staph. He is now on a 30 day ?course of doxycycline and I believe she gave him triamcinolone cream to the right leg areas to help with the itching [not exactly sure but probably ?triamcinolone]. She apparently looked at the left lateral leg wound although this was not rebiopsied and I think felt to be ultimately part of the ?same pathogenesis. He is using sample border foam and changing nevus himself. He now has a new open area on the right posterior leg which ?was his biopsy site ?I don't have any of the dermatology notes ?  02/12/17; we put the patient in compression last week with SANTYL to the wound on the left leg and the biopsy. Edema is much better and the ?depth of the wound is now at level of skin. Area is still the same ?oBiopsy site on the right lateral leg we've also been using santyl with a border foam dressing and he is changing this himself. ?02/19/17; Using silver alginate started last week to both the substantial left leg wound and the biopsy site on the right wound. He is tolerating ?compression well. Has a an appointment with his primary M.D. tomorrow wondering about diuretics although I'm wondering if  the edema problem ?is actually lymphedema ?02/26/17; the patient has been to see his primary doctor Dr. Jerrel Ivory at [facility] our primary care. She started him on Lasix 20 mg and this ?seems to have

## 2021-08-21 NOTE — Progress Notes (Signed)
COSIMO, SCHERTZER ([id number]) ?Visit Report for 08/20/2021 ?Arrival Information Details ?Patient Name: [name], [name] ?Date of Service: 08/20/2021 8:00 AM ?Medical Record Number: [id number] ?Patient Account Number: [id number] ?Date of Birth/Sex: Aug 08, 1980 (41 y.o. M) ?Treating RN: Carlene Coria ?Primary Care Glyndon Tursi: [name] Other Clinician: ?Referring Hiba Garry: [name] ?Treating Erynne Kealey/Extender: Jeri Cos ?Weeks in Treatment: 245 ?Visit Information History Since Last Visit ?All ordered tests and consults were completed: No ?Patient Arrived: Ambulatory ?Added or deleted any medications: No ?Arrival Time: 08:09 ?Any new allergies or adverse reactions: No ?Accompanied By: self ?Had a fall or experienced change in No ?Transfer Assistance: None ?activities of daily living that may affect ?Patient Identification Verified: Yes ?risk of falls: ?Secondary Verification Process Completed: Yes ?Signs or symptoms of abuse/neglect since last visito No ?Patient Requires Transmission-Based No ?Hospitalized since last visit: No ?Precautions: ?Implantable device outside of the clinic excluding No ?Patient Has Alerts: Yes ?cellular tissue based products placed in the center ?Patient Alerts: Patient has reaction ?since last visit: ?to ?Has Dressing in Place as Prescribed: Yes ?silver dressings. ?Has Compression in Place as Prescribed: Yes ?Pain Present Now: No ?Electronic Signature(s) ?Signed: 08/21/2021 9:58:52 AM By: Carlene Coria RN ?Entered ByCarlene Coria on 08/20/2021 08:09:54 ?GUY, SEESE ([id number]) ?-------------------------------------------------------------------------------- ?Clinic Level of Care Assessment Details ?Patient Name: [name] ?Date of Service: 08/20/2021 8:00 AM ?Medical Record Number: [id number] ?Patient Account Number: [id number] ?Date of Birth/Sex: April 12, 1981 (41 y.o. M) ?Treating RN: Carlene Coria ?Primary Care Eliyahu Bille: [name] Other Clinician: ?Referring Julie-Anne Torain: [name] ?Treating Felica Chargois/Extender: Jeri Cos ?Weeks in Treatment: 245 ?Clinic Level of Care Assessment Items ?TOOL 1 Quantity Score ?[]  - Use when EandM and Procedure is performed on INITIAL visit 0 ?ASSESSMENTS - Nursing Assessment / Reassessment ?[]  - General Physical Exam (combine w/ comprehensive assessment (listed just below) when performed on new ?0 ?pt. evals) ?[]  - 0 ?Comprehensive Assessment (HX, ROS, Risk Assessments, Wounds Hx, etc.) ?ASSESSMENTS - Wound and Skin Assessment / Reassessment ?[]  - Dermatologic / Skin Assessment (not related to wound area) 0 ?ASSESSMENTS - Ostomy and/or Continence Assessment and Care ?[]  - Incontinence Assessment and Management 0 ?[]  - 0 ?Ostomy Care Assessment and Management (repouching, etc.) ?PROCESS - Coordination of Care ?[]  - Simple Patient / Family Education for ongoing care 0 ?[]  - 0 ?Complex (extensive) Patient / Family Education for ongoing care ?[]  - 0 ?Staff obtains Consents, Records, Test Results / Process Orders ?[]  - 0 ?Staff telephones HHA, Nursing Homes / Clarify orders / etc ?[]  - 0 ?Routine Transfer to another Facility (non-emergent condition) ?[]  - 0 ?Routine Hospital Admission (non-emergent condition) ?[]  - 0 ?New Admissions / Biomedical engineer / Ordering NPWT, Apligraf, etc. ?[]  - 0 ?Emergency Hospital Admission (emergent condition) ?PROCESS - Special Needs ?[]  - Pediatric / Minor Patient Management 0 ?[]  - 0 ?Isolation Patient Management ?[]  - 0 ?Hearing / Language / Visual special needs ?[]  - 0 ?Assessment of Community assistance (transportation, D/C planning, etc.) ?[]  - 0 ?Additional assistance / Altered mentation ?[]  - 0 ?Support Surface(s) Assessment (bed, cushion, seat, etc.) ?INTERVENTIONS - Miscellaneous ?[]  - External ear exam 0 ?[]  - 0 ?Patient Transfer (multiple staff / Civil Service fast streamer / Similar devices) ?[]  - 0 ?Simple Staple / Suture removal (25 or less) ?[]  - 0 ?Complex Staple / Suture removal (26 or more) ?[]  -  0 ?Hypo/Hyperglycemic Management (do not check if billed separately) ?[]  - 0 ?Ankle / Brachial Index (ABI) - do not check if billed separately ?Has the patient been seen at the  hospital within the last three years: Yes ?Total Score: 0 ?Level Of Care: ____ ?LAWRNCE, REYEZ ([id number]) ?Electronic Signature(s) ?Signed: 08/21/2021 9:58:52 AM By: Carlene Coria RN ?Entered ByCarlene Coria on 08/20/2021 08:26:43 ?DEAUNDRA, KUTZER ([id number]) ?-------------------------------------------------------------------------------- ?Compression Therapy Details ?Patient Name: [name] ?Date of Service: 08/20/2021 8:00 AM ?Medical Record Number: [id number] ?Patient Account Number: [id number] ?Date of Birth/Sex: 1980/12/28 (41 y.o. M) ?Treating RN: Carlene Coria ?Primary Care Karelly Dewalt: [name] Other Clinician: ?Referring Alana Dayton: [name] ?Treating Khadim Lundberg/Extender: Jeri Cos ?Weeks in Treatment: 245 ?Compression Therapy Performed for Wound Assessment: Wound #1 Left,Lateral Lower Leg ?Performed By: Clinician Carlene Coria, RN ?Compression Type: Four Layer ?Post Procedure Diagnosis ?Same as Pre-procedure ?Electronic Signature(s) ?Signed: 08/21/2021 9:58:52 AM By: Carlene Coria RN ?Entered By: Carlene Coria on 08/20/2021 08:27:11 ?ANGELUS, HOOPES ([id number]) ?-------------------------------------------------------------------------------- ?Encounter Discharge Information Details ?Patient Name: [name] ?Date of Service: 08/20/2021 8:00 AM ?Medical Record Number: [id number] ?Patient Account Number: [id number] ?Date of Birth/Sex: 11/04/1980 (41 y.o. M) ?Treating RN: Carlene Coria ?Primary Care Lakendra Helling: [name] Other Clinician: ?Referring Dorlisa Savino: [name] ?Treating Gaven Eugene/Extender: Jeri Cos ?Weeks in Treatment: 245 ?Encounter Discharge Information Items ?Discharge Condition: Stable ?Ambulatory Status: Ambulatory ?Discharge Destination: Home ?Transportation: Private Auto ?Accompanied By: self ?Schedule Follow-up  Appointment: Yes ?Clinical Summary of Care: Patient Declined ?Electronic Signature(s) ?Signed: 08/21/2021 9:58:52 AM By: Carlene Coria RN ?Entered By: Carlene Coria on 08/20/2021 08:40:27 ?TOREZ, BEAUREGARD ([id number]) ?-------------------------------------------------------------------------------- ?Lower Extremity Assessment Details ?Patient Name: RYER, ASATO ?Date of Service: 08/20/2021 8:00 AM ?Medical Record Number: [id number] ?Patient Account Number: [id number] ?Date of Birth/Sex: February 24, 1981 (41 y.o. M) ?Treating RN: Carlene Coria ?Primary Care Charmel Pronovost: [name] Other Clinician: ?Referring Akshaya Toepfer: [name] ?Treating Yandriel Boening/Extender: Jeri Cos ?Weeks in Treatment: 245 ?Edema Assessment ?Assessed: [Left: No] [Right: No] ?Edema: [Left: Ye] [Right: s] ?Calf ?Left: Right: ?Point of Measurement: 35 cm From Medial Instep 46 cm ?Ankle ?Left: Right: ?Point of Measurement: 10 cm From Medial Instep 28 cm ?Vascular Assessment ?Pulses: ?Dorsalis Pedis ?Palpable: [Left:Yes] ?Electronic Signature(s) ?Signed: 08/21/2021 9:58:52 AM By: Carlene Coria RN ?Entered By: Carlene Coria on 08/20/2021 08:17:11 ?BYREN, PANKOW ([id number]) ?-------------------------------------------------------------------------------- ?Multi Wound Chart Details ?Patient Name: AGNES, PROBERT ?Date of Service: 08/20/2021 8:00 AM ?Medical Record Number: [id number] ?Patient Account Number: [id number] ?Date of Birth/Sex: 1981/04/14 (41 y.o. M) ?Treating RN: Carlene Coria ?Primary Care Kynedi Profitt: [name] Other Clinician: ?Referring Ianna Salmela: [name] ?Treating Jalaiya Oyster/Extender: Jeri Cos ?Weeks in Treatment: 245 ?Vital Signs ?Height(in): 71 ?Pulse(bpm): 73 ?Weight(lbs): 338 ?Blood Pressure(mmHg): 142/88 ?Body Mass Index(BMI): 47.1 ?Temperature(??F): 98.3 ?Respiratory Rate(breaths/min): 18 ?Photos: [N/A:N/A] ?Wound Location: Left, Lateral Lower Leg N/A N/A ?Wounding Event: Gradually Appeared N/A N/A ?Primary Etiology: Pyoderma N/A  N/A ?Comorbid History: Sleep Apnea, Hypertension, Colitis N/A N/A ?Date Acquired: 11/18/2015 N/A N/A ?Weeks of Treatment: 245 N/A N/A ?Wound Status: Open N/A N/A ?Wound Recurrence: No N/A N/A ?Measurements L x W x D (cm) 3.2x1.9x0.3 N/A N/A ?Area (

## 2021-08-22 DIAGNOSIS — I872 Venous insufficiency (chronic) (peripheral): Secondary | ICD-10-CM | POA: Diagnosis not present

## 2021-08-22 NOTE — Progress Notes (Signed)
RONON, FERGER ([id number]) ?Visit Report for 08/22/2021 ?Physician Orders Details ?Patient Name: [name], [name] ?Date of Service: 08/22/2021 8:45 AM ?Medical Record Number: [id number] ?Patient Account Number: [id number] ?Date of Birth/Sex: August 16, 1980 (41 y.o. M) ?Treating RN: Donnamarie Poag ?Primary Care Provider: [name] Other Clinician: ?Referring Provider: [name] ?Treating Provider/Extender: Kalman Shan ?Weeks in Treatment: 246 ?Verbal / Phone Orders: No ?Diagnosis Coding ?Follow-up Appointments ?o Return Appointment in 1 week. ?o Nurse Visit as needed - twice a week ?Bathing/ Shower/ Hygiene ?o Clean wound with Normal Saline or wound cleanser. ?o May shower with wound dressing protected with water repellent cover or cast protector. ?Edema Control - Lymphedema / Segmental Compressive Device / Other ?o Optional: One layer of unna paste to top of compression wrap (to act as an anchor). ?o Elevate, Exercise Daily and Avoid Standing for Long Periods of Time. ?o Elevate legs to the level of the heart and pump ankles as often as possible ?o Elevate leg(s) parallel to the floor when sitting. ?Wound Treatment ?Wound #1 - Lower Leg Wound Laterality: Left, Lateral ?Cleanser: Wound Cleanser 3 x Per Week/30 Days ?Discharge Instructions: Wash your hands with soap and water. Remove old dressing, discard into plastic bag and place into trash. ?Cleanse the wound with Wound Cleanser prior to applying a clean dressing using gauze sponges, not tissues or cotton balls. Do not ?scrub or use excessive force. Pat dry using gauze sponges, not tissue or cotton balls. ?Topical: Clobetasol Propionate ointment 0.05%, 60 (g) tube 3 x Per Week/30 Days ?Discharge Instructions: Pt to bring to appt- ?Primary Dressing: Hydrofera Blue Ready Transfer Foam, 4x5 (in/in) 3 x Per Week/30 Days ?Discharge Instructions: Apply Hydrofera Blue Ready to wound bed as directed ?Secondary Dressing: (NON-BORDER) Zetuvit Plus Silicone  NON-BORDER 5x5 (in/in) 3 x Per Week/30 Days ?Compression Wrap: Medichoice 4 layer Compression System, 35-40 mmHG (Generic) 3 x Per Week/30 Days ?Discharge Instructions: Apply multi-layer wrap as directed. ?Electronic Signature(s) ?Signed: 08/22/2021 9:05:54 AM By: Kalman Shan DO ?Signed: 08/22/2021 9:14:49 AM By: Donnamarie Poag ?Entered ByDonnamarie Poag on 08/22/2021 08:57:39 ?KYRIE, FLUDD ([id number]) ?-------------------------------------------------------------------------------- ?SuperBill Details ?Patient Name: [name], [name] ?Date of Service: 08/22/2021 ?Medical Record Number: [id number] ?Patient Account Number: [id number] ?Date of Birth/Sex: 07-08-1980 (41 y.o. M) ?Treating RN: Donnamarie Poag ?Primary Care Provider: [name] Other Clinician: ?Referring Provider: [name] ?Treating Provider/Extender: Kalman Shan ?Weeks in Treatment: 246 ?Diagnosis Coding ?ICD-10 Codes ?Code Description ?I87.2 Venous insufficiency (chronic) (peripheral) ?G99.242 Non-pressure chronic ulcer of left calf with fat layer exposed ?E11.622 Type 2 diabetes mellitus with other skin ulcer ?L88 Pyoderma gangrenosum ?F17.208 Nicotine dependence, unspecified, with other nicotine-induced disorders ?Facility Procedures ?CPT4 Code: [id number] ?Description: (Facility Use Only) [id number] - APPLY MULTLAY COMPRS LWR LT LEG ?Modifier: ?Quantity: 1 ?Electronic Signature(s) ?Signed: 08/22/2021 9:05:54 AM By: Kalman Shan DO ?Signed: 08/22/2021 9:14:49 AM By: Donnamarie Poag ?Entered ByDonnamarie Poag on 08/22/2021 08:58:25 ?

## 2021-08-22 NOTE — Progress Notes (Signed)
WINFREY, CHILLEMI ([id number]) ?Visit Report for 08/22/2021 ?Arrival Information Details ?Patient Name: [name], [name] ?Date of Service: 08/22/2021 8:45 AM ?Medical Record Number: [id number] ?Patient Account Number: [id number] ?Date of Birth/Sex: [birth year] (41 y.o. M) ?Treating RN: Donnamarie Poag ?Primary Care Fraser Busche: [name] Other Clinician: ?Referring Keenon Leitzel: [name] ?Treating Yesika Rispoli/Extender: Kalman Shan ?Weeks in Treatment: 246 ?Visit Information History Since Last Visit ?Added or deleted any medications: No ?Patient Arrived: Ambulatory ?Had a fall or experienced change in No ?Arrival Time: 08:42 ?activities of daily living that may affect ?Accompanied By: self ?risk of falls: ?Transfer Assistance: None ?Hospitalized since last visit: No ?Patient Identification Verified: Yes ?Has Dressing in Place as Prescribed: Yes ?Secondary Verification Process Completed: Yes ?Has Compression in Place as Prescribed: Yes ?Patient Requires Transmission-Based No ?Pain Present Now: No ?Precautions: ?Patient Has Alerts: Yes ?Patient Alerts: Patient has reaction ?to ?silver dressings. ?Electronic Signature(s) ?Signed: 08/22/2021 9:14:49 AM By: Donnamarie Poag ?Entered ByDonnamarie Poag on 08/22/2021 08:55:29 ?[name], [name] ([id number]) ?-------------------------------------------------------------------------------- ?Encounter Discharge Information Details ?Patient Name: [name], [name] ?Date of Service: 08/22/2021 8:45 AM ?Medical Record Number: [id number] ?Patient Account Number: [id number] ?Date of Birth/Sex: 09/30/1980 (41 y.o. M) ?Treating RN: Donnamarie Poag ?Primary Care Giani Betzold: [name] Other Clinician: ?Referring Bowe Sidor: [name] ?Treating Caleb Prigmore/Extender: Kalman Shan ?Weeks in Treatment: 246 ?Encounter Discharge Information Items ?Discharge Condition: Stable ?Ambulatory Status: Ambulatory ?Discharge Destination: Home ?Transportation: Private Auto ?Accompanied By: self ?Schedule Follow-up Appointment:  Yes ?Clinical Summary of Care: ?Electronic Signature(s) ?Signed: 08/22/2021 9:14:49 AM By: Donnamarie Poag ?Entered ByDonnamarie Poag on 08/22/2021 08:58:10 ?[name], [name] ([id number]) ?-------------------------------------------------------------------------------- ?Wound Assessment Details ?Patient Name: [name], [name] ?Date of Service: 08/22/2021 8:45 AM ?Medical Record Number: [id number] ?Patient Account Number: [id number] ?Date of Birth/Sex: [birth year] (41 y.o. M) ?Treating RN: Donnamarie Poag ?Primary Care Dayden Viverette: [name] Other Clinician: ?Referring Stephon Weathers: [name] ?Treating Sue Fernicola/Extender: Kalman Shan ?Weeks in Treatment: 246 ?Wound Status ?Wound Number: 1 ?Primary Etiology: Pyoderma ?Wound Location: Left, Lateral Lower Leg ?Wound Status: Open ?Wounding Event: Gradually Appeared ?Comorbid History: Sleep Apnea, Hypertension, Colitis ?Date Acquired: 11/18/2015 ?Weeks Of Treatment: 246 ?Clustered Wound: No ?Wound Measurements ?Length: (cm) 3.2 ?Width: (cm) 1.9 ?Depth: (cm) 0.3 ?Area: (cm?) 4.775 ?Volume: (cm?) 1.433 ?% Reduction in Area: 2.7% ?% Reduction in Volume: 63.5% ?Epithelialization: Medium (34-66%) ?Wound Description ?Classification: Full Thickness With Exposed Support Structures ?Wound Margin: Flat and Intact ?Exudate Amount: Medium ?Exudate Type: Serosanguineous ?Exudate Color: red, brown ?Foul Odor After Cleansing: No ?Slough/Fibrino Yes ?Wound Bed ?Granulation Amount: Large (67-100%) Exposed Structure ?Granulation Quality: Red, Pink ?Fascia Exposed: No ?Necrotic Amount: Small (1-33%) ?Fat Layer (Subcutaneous Tissue) Exposed: Yes ?Necrotic Quality: Adherent Slough ?Tendon Exposed: No ?Muscle Exposed: No ?Joint Exposed: No ?Bone Exposed: No ?Treatment Notes ?Wound #1 (Lower Leg) Wound Laterality: Left, Lateral ?Cleanser ?Wound Cleanser ?Discharge Instruction: Wash your hands with soap and water. Remove old dressing, discard into plastic bag and place into trash. ?Cleanse the wound with Wound  Cleanser prior to applying a clean dressing using gauze sponges, not tissues or cotton balls. Do ?not scrub or use excessive force. Pat dry using gauze sponges, not tissue or cotton balls. ?Peri-Wound Care ?Topical ?Clobetasol Propionate ointment 0.05%, 60 (g) tube ?Discharge Instruction: Pt to bring to appt- ?Primary Dressing ?Hydrofera Blue Ready Transfer Foam, 4x5 (in/in) ?Discharge Instruction: Apply Hydrofera Blue Ready to wound bed as directed ?Secondary Dressing ?(NON-BORDER) Zetuvit Plus Silicone NON-BORDER 5x5 (in/in) ?Secured With ?[name], [name] ([id number]) ?Compression Wrap ?Medichoice 4 layer Compression System, 35-40 mmHG ?Discharge Instruction: Apply multi-layer wrap as directed. ?Compression Stockings ?Add-Ons ?  Electronic Signature(s) ?Signed: 08/22/2021 9:14:49 AM By: Donnamarie Poag ?Entered ByDonnamarie Poag on 08/22/2021 08:56:19 ?

## 2021-08-24 DIAGNOSIS — I872 Venous insufficiency (chronic) (peripheral): Secondary | ICD-10-CM | POA: Diagnosis not present

## 2021-08-24 NOTE — Progress Notes (Signed)
[name], [name] ([id number]) ?Visit Report for 08/24/2021 ?Arrival Information Details ?Patient Name: [name], [name] ?Date of Service: 08/24/2021 8:00 AM ?Medical Record Number: [id number] ?Patient Account Number: [id number] ?Date of Birth/Sex: 26-Feb-1981 (41 y.o. M) ?Treating RN: Donnamarie Poag ?Primary Care [name] Ehrsam: [name] Other Clinician: ?Referring Meghana Tullo: [name] ?Treating Malic Rosten/Extender: Jeri Cos ?Weeks in Treatment: 246 ?Visit Information History Since Last Visit ?Added or deleted any medications: No ?Patient Arrived: Ambulatory ?Had a fall or experienced change in No ?Arrival Time: 08:00 ?activities of daily living that may affect ?Accompanied By: self ?risk of falls: ?Transfer Assistance: None ?Hospitalized since last visit: No ?Patient Identification Verified: Yes ?Has Dressing in Place as Prescribed: Yes ?Secondary Verification Process Completed: Yes ?Has Compression in Place as Prescribed: Yes ?Patient Requires Transmission-Based No ?Pain Present Now: No ?Precautions: ?Patient Has Alerts: Yes ?Patient Alerts: Patient has reaction ?to ?silver dressings. ?Electronic Signature(s) ?Signed: 08/24/2021 3:14:37 PM By: Donnamarie Poag ?Entered ByDonnamarie Poag on 08/24/2021 08:09:33 ?[name], [name] ([id number]) ?-------------------------------------------------------------------------------- ?Compression Therapy Details ?Patient Name: [name], [name] ?Date of Service: 08/24/2021 8:00 AM ?Medical Record Number: [id number] ?Patient Account Number: [id number] ?Date of Birth/Sex: 1980-09-27 (41 y.o. M) ?Treating RN: Donnamarie Poag ?Primary Care Krisanne Lich: [name] Other Clinician: ?Referring Breeanna Galgano: [name] ?Treating Rual Vermeer/Extender: Jeri Cos ?Weeks in Treatment: 246 ?Compression Therapy Performed for Wound Assessment: Wound #1 Left,Lateral Lower Leg ?Performed By: Clinician Donnamarie Poag, RN ?Compression Type: Four Layer ?Electronic Signature(s) ?Signed: 08/24/2021 3:14:37 PM By: Donnamarie Poag ?Entered  ByDonnamarie Poag on 08/24/2021 08:10:22 ?[name], [name] ([id number]) ?-------------------------------------------------------------------------------- ?Encounter Discharge Information Details ?Patient Name: [name], [name] ?Date of Service: 08/24/2021 8:00 AM ?Medical Record Number: [id number] ?Patient Account Number: [id number] ?Date of Birth/Sex: 09-Jan-1981 (41 y.o. M) ?Treating RN: Donnamarie Poag ?Primary Care Reis Goga: [name] Other Clinician: ?Referring Averly Ericson: [name] ?Treating Scotty Weigelt/Extender: Jeri Cos ?Weeks in Treatment: 246 ?Encounter Discharge Information Items ?Discharge Condition: Stable ?Ambulatory Status: Ambulatory ?Discharge Destination: Home ?Transportation: Private Auto ?Accompanied By: self ?Schedule Follow-up Appointment: Yes ?Clinical Summary of Care: ?Electronic Signature(s) ?Signed: 08/24/2021 3:14:37 PM By: Donnamarie Poag ?Entered ByDonnamarie Poag on 08/24/2021 08:12:52 ?[name], [name] ([id number]) ?-------------------------------------------------------------------------------- ?Non-Wound Condition Assessment Details ?Patient Name: [name], [name] ?Date of Service: 08/24/2021 8:00 AM ?Medical Record Number: [id number] ?Patient Account Number: [id number] ?Date of Birth/Sex: [birth year] (41 y.o. M) ?Treating RN: Donnamarie Poag ?Primary Care Kimberlly Norgard: [name] Other Clinician: ?Referring Fransico Sciandra: [name] ?Treating Elison Worrel/Extender: Jeri Cos ?Weeks in Treatment: 246 ?Non-Wound Condition: ?Condition: Other Dermatologic Condition ?Location: Leg ?Side: Left ?Photos ?Electronic Signature(s) ?Signed: 08/24/2021 3:14:37 PM By: Donnamarie Poag ?Entered ByDonnamarie Poag on 08/24/2021 08:14:57 ?[name], [name] ([id number]) ?-------------------------------------------------------------------------------- ?Wound Assessment Details ?Patient Name: [name], [name] ?Date of Service: 08/24/2021 8:00 AM ?Medical Record Number: [id number] ?Patient Account Number: [id number] ?Date of Birth/Sex: 11-06-1980 (41 y.o.  M) ?Treating RN: Donnamarie Poag ?Primary Care Nga Rabon: [name] Other Clinician: ?Referring Otha Rickles: [name] ?Treating Leeanna Slaby/Extender: Jeri Cos ?Weeks in Treatment: 246 ?Wound Status ?Wound Number: 1 ?Primary Etiology: Pyoderma ?Wound Location: Left, Lateral Lower Leg ?Wound Status: Open ?Wounding Event: Gradually Appeared ?Comorbid History: Sleep Apnea, Hypertension, Colitis ?Date Acquired: 11/18/2015 ?Weeks Of Treatment: 246 ?Clustered Wound: No ?Wound Measurements ?Length: (cm) 3.2 ?Width: (cm) 1.9 ?Depth: (cm) 0.3 ?Area: (cm?) 4.775 ?Volume: (cm?) 1.433 ?% Reduction in Area: 2.7% ?% Reduction in Volume: 63.5% ?Epithelialization: Medium (34-66%) ?Wound Description ?Classification: Full Thickness With Exposed Support Structures ?Wound Margin: Flat and Intact ?Exudate Amount: Medium ?Exudate Type: Serosanguineous ?Exudate Color: red, brown ?Foul Odor After Cleansing: No ?Slough/Fibrino Yes ?Wound Bed ?Granulation Amount: Large (67-100%)  Exposed Structure ?Granulation Quality: Red, Pink ?Fascia Exposed: No ?Necrotic Amount: Small (1-33%) ?Fat Layer (Subcutaneous Tissue) Exposed: Yes ?Necrotic Quality: Adherent Slough ?Tendon Exposed: No ?Muscle Exposed: No ?Joint Exposed: No ?Bone Exposed: No ?Treatment Notes ?Wound #1 (Lower Leg) Wound Laterality: Left, Lateral ?Cleanser ?Wound Cleanser ?Discharge Instruction: Wash your hands with soap and water. Remove old dressing, discard into plastic bag and place into trash. ?Cleanse the wound with Wound Cleanser prior to applying a clean dressing using gauze sponges, not tissues or cotton balls. Do ?not scrub or use excessive force. Pat dry using gauze sponges, not tissue or cotton balls. ?Peri-Wound Care ?Topical ?Clobetasol Propionate ointment 0.05%, 60 (g) tube ?Discharge Instruction: Pt to bring to appt- ?Primary Dressing ?Hydrofera Blue Ready Transfer Foam, 4x5 (in/in) ?Discharge Instruction: Apply Hydrofera Blue Ready to wound bed as  directed ?Secondary Dressing ?(NON-BORDER) Zetuvit Plus Silicone NON-BORDER 5x5 (in/in) ?Secured With ?[name], [name] ([id number]) ?Compression Wrap ?Medichoice 4 layer Compression System, 35-40 mmHG ?Discharge Instruction: Apply multi-layer wrap as directed. ?Compression Stockings ?Add-Ons ?Electronic Signature(s) ?Signed: 08/24/2021 3:14:37 PM By: Donnamarie Poag ?Entered ByDonnamarie Poag on 08/24/2021 08:09:58 ?

## 2021-08-24 NOTE — Progress Notes (Signed)
MARKEE, MATERA ([id number]) ?Visit Report for 08/24/2021 ?Physician Orders Details ?Patient Name: [name], [name] ?Date of Service: 08/24/2021 8:00 AM ?Medical Record Number: [id number] ?Patient Account Number: [id number] ?Date of Birth/Sex: [birth year] (41 y.o. M) ?Treating RN: Donnamarie Poag ?Primary Care Provider: [name] Other Clinician: ?Referring Provider: [name] ?Treating Provider/Extender: Jeri Cos ?Weeks in Treatment: 246 ?Verbal / Phone Orders: No ?Diagnosis Coding ?Follow-up Appointments ?o Return Appointment in 1 week. ?o Nurse Visit as needed - twice a week ?Bathing/ Shower/ Hygiene ?o Clean wound with Normal Saline or wound cleanser. ?o May shower with wound dressing protected with water repellent cover or cast protector. ?Edema Control - Lymphedema / Segmental Compressive Device / Other ?o Optional: One layer of unna paste to top of compression wrap (to act as an anchor). ?o Elevate, Exercise Daily and Avoid Standing for Long Periods of Time. ?o Elevate legs to the level of the heart and pump ankles as often as possible ?o Elevate leg(s) parallel to the floor when sitting. ?Non-Wound Condition ?o Additional non-wound orders/instructions: - Gentamycin to right mid shin area of inflammation ?Wound Treatment ?Wound #1 - Lower Leg Wound Laterality: Left, Lateral ?Cleanser: Wound Cleanser 3 x Per Week/30 Days ?Discharge Instructions: Wash your hands with soap and water. Remove old dressing, discard into plastic bag and place into trash. ?Cleanse the wound with Wound Cleanser prior to applying a clean dressing using gauze sponges, not tissues or cotton balls. Do not ?scrub or use excessive force. Pat dry using gauze sponges, not tissue or cotton balls. ?Topical: Clobetasol Propionate ointment 0.05%, 60 (g) tube 3 x Per Week/30 Days ?Discharge Instructions: Pt to bring to appt- ?Primary Dressing: Hydrofera Blue Ready Transfer Foam, 4x5 (in/in) 3 x Per Week/30 Days ?Discharge  Instructions: Apply Hydrofera Blue Ready to wound bed as directed ?Secondary Dressing: (NON-BORDER) Zetuvit Plus Silicone NON-BORDER 5x5 (in/in) 3 x Per Week/30 Days ?Compression Wrap: Medichoice 4 layer Compression System, 35-40 mmHG (Generic) 3 x Per Week/30 Days ?Discharge Instructions: Apply multi-layer wrap as directed. ?Patient Medications ?Allergies: milk, Biaxin, seasonal ?Notifications Medication Indication Start End ?Bactrim DS 08/24/2021 ?DOSE 1 - oral 800 mg-160 mg tablet - 1 tablet oral taken 2 times per day for 14 days ?Electronic Signature(s) ?Signed: 08/24/2021 8:14:09 AM By: Worthy Keeler PA-C ?Entered By: Worthy Keeler on 08/24/2021 08:14:08 ?[name], [name] ([id number]) ?-------------------------------------------------------------------------------- ?SuperBill Details ?Patient Name: [name], [name] ?Date of Service: 08/24/2021 ?Medical Record Number: [id number] ?Patient Account Number: [id number] ?Date of Birth/Sex: 29-Aug-1980 (41 y.o. 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

## 2021-08-27 ENCOUNTER — Encounter: Payer: 59 | Admitting: Physician Assistant

## 2021-08-31 ENCOUNTER — Encounter: Payer: 59 | Admitting: Physician Assistant

## 2021-08-31 DIAGNOSIS — I872 Venous insufficiency (chronic) (peripheral): Secondary | ICD-10-CM | POA: Diagnosis not present

## 2021-08-31 NOTE — Progress Notes (Addendum)
[name], [name] ([id number]) ?Visit Report for 08/31/2021 ?Chief Complaint Document Details ?Patient Name: [name], [name] ?Date of Service: 08/31/2021 8:00 AM ?Medical Record Number: [id number] ?Patient Account Number: [id number] ?Date of Birth/Sex: Dec 14, 1980 (41 y.o. M) ?Treating RN: Donnamarie Poag ?Primary Care Provider: [name] Other Clinician: ?Referring Provider: [name] ?Treating Provider/Extender: Jeri Cos ?Weeks in Treatment: 247 ?Information Obtained from: Patient ?Chief Complaint ?He is here in follow up evaluation for LLE pyoderma ulcer ?Electronic Signature(s) ?Signed: 08/31/2021 8:18:35 AM By: Worthy Keeler PA-C ?Entered By: Worthy Keeler on 08/31/2021 08:18:35 ?[name], [name] ([id number]) ?-------------------------------------------------------------------------------- ?HPI Details ?Patient Name: [name], [name] ?Date of Service: 08/31/2021 8:00 AM ?Medical Record Number: [id number] ?Patient Account Number: [id number] ?Date of Birth/Sex: [birth year] (41 y.o. M) ?Treating RN: Donnamarie Poag ?Primary Care Provider: [name] Other Clinician: ?Referring Provider: [name] ?Treating Provider/Extender: Jeri Cos ?Weeks in Treatment: 247 ?History of Present Illness ?HPI Description: 12/04/16; 41 year old man who comes into the clinic today for review of a wound on the posterior left calf. He tells me that is ?been there for about a year. He is not a diabetic he does smoke half a pack per day. He was seen in the ER on 11/20/16 felt to have cellulitis around ?the wound and was given clindamycin. An x-ray did not show osteomyelitis. The patient initially tells me that he has a milk allergy that sets off a ?pruritic itching rash on his lower legs which she scratches incessantly and he thinks that's what may have set up the wound. He has been using ?various topical antibiotics and ointments without any effect. He works in a [employer]ing [employer] and is on his feet all day. He does not have a prior ?history  of wounds however he does have the rash on both lower legs the right arm and the ventral aspect of his left arm. These are excoriations ?and clearly have had scratching however there are of macular looking areas on both legs including a substantial larger area on the right leg. This ?does not have an underlying open area. There is no blistering. 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 He seemed to recognize this. On further questioning he admits ?to a 5 year history of quiesced. ulcerative colitis. He is not in any treatment for this. He's had no recent travel ?12/11/16; the patient arrives today with his wound and roughly the same condition we've been using silver alginate this is a deep punched out ?wound with some surrounding erythema but no tenderness. 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 I'm really not able to understand this ?We have also received information from the patient's dermatologist in [place] notes from April 2016. This was a doctor Agarwal-antal. 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 He was prescribed topical steroid high potency under occlusion which helped but at this point the ?patient did not have a deep punched out wound. ?12/18/16; the patient's wound is larger in terms of surface area however this surface looks better and there is less depth. The surrounding erythema ?also is better. The patient states that the wrap we put on came off 2 days ago when he has been using his compression stockings.  He we are in ?the process of getting a dermatology consult. ?12/26/16 on evaluation today patient's left lower extremity wound shows evidence of infection with  surrounding erythema noted. He has been ?tolerating the dressing changes but states that he has noted more discomfort. There is a larger area of erythema surrounding the wound. No ?fevers, chills, nausea, or vomiting noted at this time. With that being said the wound still does have slough covering the surface. He is not allergic ?to any medication that he is aware of at this point. In regard to his right lower extremity he had several regions that are erythematous and ?pruritic he wonders if there's anything we can do to help that. ?01/02/17 I reviewed patient's wound culture which was obtained his visit last week. He was placed on doxycycline at that point. Unfortunately that ?does not appear to be an antibiotic that would likely help with the situation however the pseudomonas noted on culture is sensitive to Cipro. Also ?unfortunately patient's wound seems to have a large compared to last week's evaluation. Not severely so but there are definitely increased ?measurements in general. He is continuing to have discomfort as well he writes this to be a seven out of 10. In fact he would prefer me not to ?perform any debridement today due to the fact that he is having discomfort and considering he has an active infection on the little reluctant to do ?so anyway. No fevers, chills, nausea, or vomiting noted at this time. ?01/08/17; patient seems dermatology on September 5. I suspect dermatology will want the slides from the biopsy I did sent to their pathologist. ?I'm not sure if there is a way we can expedite that. In any case the culture I did before I left on vacation 3 weeks ago showed Pseudomonas he ?was given 10 days of Cipro and per her description of her intake nurses is actually somewhat better this week although the wound is quite a bit ?bigger than I remember the last time I saw this. He still has 3 more days of Cipro ?01/21/17; dermatology appointment tomorrow. He has completed the ciprofloxacin for Pseudomonas.  Surface of the wound looks better however he ?is had some deterioration in the lesions on his right leg. Meantime the left lateral leg wound we will continue with sample ?01/29/17; patient had his dermatology appointment but I can't yet see that note. He is completed his antibiotics. The wound is more superficial but ?considerably larger in circumferential area than when he came in. This is in his left lateral calf. He also has swollen erythematous areas with ?superficial wounds on the right leg and small papular areas on both arms. There apparently areas in her his upper thighs and buttocks I did not ?look at those. Dermatology biopsied the right leg. Hopefully will have their input next week. ?02/05/17; patient went back to see his dermatologist who told him that he had a "scratching problem" as well as staph. He is now on a 30 day ?course of doxycycline and I believe she gave him triamcinolone cream to the right leg areas to help with the itching [not exactly sure but probably ?triamcinolone]. She apparently looked at the left lateral leg wound although this was not rebiopsied and I think felt to be ultimately part of the ?same pathogenesis. He is using sample border foam and changing nevus himself. He now has a new open area on the right posterior leg which ?was his biopsy site ?I don't have any of the dermatology notes ?  02/12/17; we put the patient in compression last week with SANTYL to the wound on the left leg and the biopsy. Edema is much better and the ?depth of the wound is now at level of skin. Area is still the same ?oBiopsy site on the right lateral leg we've also been using santyl with a border foam dressing and he is changing this himself. ?02/19/17; Using silver alginate started last week to both the substantial left leg wound and the biopsy site on the right wound. He is tolerating ?compression well. Has a an appointment with his primary M.D. tomorrow wondering about diuretics although I'm wondering if  the edema problem ?is actually lymphedema ?02/26/17; the patient has been to see his primary doctor Dr. Jerrel Ivory at [place] our primary care. She started him on Lasix 20 mg and this ?seems to hav

## 2021-08-31 NOTE — Progress Notes (Signed)
[name], [name] ([id number]) ?Visit Report for 08/31/2021 ?Arrival Information Details ?Patient Name: [name], [name] ?Date of Service: 08/31/2021 8:00 AM ?Medical Record Number: [id number] ?Patient Account Number: [id number] ?Date of Birth/Sex: Mar 26, 1981 (41 y.o. M) ?Treating RN: Donnamarie Poag ?Primary Care Oralia Criger: [name] Other Clinician: ?Referring Nanetta Wiegman: [name] ?Treating Caidon Foti/Extender: Jeri Cos ?Weeks in Treatment: 247 ?Visit Information History Since Last Visit ?Added or deleted any medications: No ?Patient Arrived: Ambulatory ?Had a fall or experienced change in No ?Arrival Time: 08:03 ?activities of daily living that may affect ?Accompanied By: self ?risk of falls: ?Transfer Assistance: None ?Hospitalized since last visit: No ?Patient Identification Verified: Yes ?Has Dressing in Place as Prescribed: Yes ?Secondary Verification Process Completed: Yes ?Pain Present Now: No ?Patient Requires Transmission-Based No ?Precautions: ?Patient Has Alerts: Yes ?Patient Alerts: Patient has reaction ?to ?silver dressings. ?Electronic Signature(s) ?Signed: 08/31/2021 11:51:43 AM By: Donnamarie Poag ?Entered ByDonnamarie Poag on 08/31/2021 08:06:27 ?DEJAY, KRONK ([id number]) ?-------------------------------------------------------------------------------- ?Compression Therapy Details ?Patient Name: [name], [name] ?Date of Service: 08/31/2021 8:00 AM ?Medical Record Number: [id number] ?Patient Account Number: [id number] ?Date of Birth/Sex: 1980/12/29 (41 y.o. M) ?Treating RN: Donnamarie Poag ?Primary Care Delvonte Berenson: [name] Other Clinician: ?Referring Cynthya Yam: [name] ?Treating Aurel Nguyen/Extender: Jeri Cos ?Weeks in Treatment: 247 ?Compression Therapy Performed for Wound Assessment: Wound #1 Left,Lateral Lower Leg ?Performed By: Clinician Donnamarie Poag, RN ?Compression Type: Four Layer ?Post Procedure Diagnosis ?Same as Pre-procedure ?Electronic Signature(s) ?Signed: 08/31/2021 11:51:43 AM By: Donnamarie Poag ?Entered ByDonnamarie Poag on 08/31/2021 08:21:11 ?BLUE, WINTHER ([id number]) ?-------------------------------------------------------------------------------- ?Encounter Discharge Information Details ?Patient Name: [name], [name] ?Date of Service: 08/31/2021 8:00 AM ?Medical Record Number: [id number] ?Patient Account Number: [id number] ?Date of Birth/Sex: 07-05-1980 (41 y.o. M) ?Treating RN: Donnamarie Poag ?Primary Care Legacy Lacivita: [name] Other Clinician: ?Referring Nikan Ellingson: [name] ?Treating Kalyse Meharg/Extender: Jeri Cos ?Weeks in Treatment: 247 ?Encounter Discharge Information Items ?Discharge Condition: Stable ?Ambulatory Status: Ambulatory ?Discharge Destination: Home ?Transportation: Private Auto ?Accompanied By: self ?Schedule Follow-up Appointment: Yes ?Clinical Summary of Care: ?Electronic Signature(s) ?Signed: 08/31/2021 11:51:43 AM By: Donnamarie Poag ?Entered ByDonnamarie Poag on 08/31/2021 08:26:31 ?YONATAN, GUITRON ([id number]) ?-------------------------------------------------------------------------------- ?Lower Extremity Assessment Details ?Patient Name: [name], [name] ?Date of Service: 08/31/2021 8:00 AM ?Medical Record Number: [id number] ?Patient Account Number: [id number] ?Date of Birth/Sex: [birth year] (41 y.o. M) ?Treating RN: Donnamarie Poag ?Primary Care Deshannon Hinchliffe: [name] Other Clinician: ?Referring Kaisen Ackers: [name] ?Treating Ayansh Feutz/Extender: Jeri Cos ?Weeks in Treatment: 247 ?Edema Assessment ?Assessed: [Left: Yes] [Right: No] ?[Left: Edema] [Right: :] ?Calf ?Left: Right: ?Point of Measurement: 35 cm From Medial Instep 46.3 cm ?Ankle ?Left: Right: ?Point of Measurement: 10 cm From Medial Instep 28.5 cm ?Vascular Assessment ?Pulses: ?Dorsalis Pedis ?Palpable: [Left:Yes] ?Electronic Signature(s) ?Signed: 08/31/2021 11:51:43 AM By: Donnamarie Poag ?Entered ByDonnamarie Poag on 08/31/2021 08:13:04 ?NICOLES, SEDLACEK  ([id number]) ?-------------------------------------------------------------------------------- ?Multi Wound Chart Details ?Patient Name: [name], [name] ?Date of Service: 08/31/2021 8:00 AM ?Medical Record Number: [id number] ?Patient Account Number: [id number] ?Date of Birth/Sex: Jul 26, 1980 (41 y.o. M) ?Treating RN: Donnamarie Poag ?Primary Care Celese Banner: [name] Other Clinician: ?Referring Meryl Hubers: [name] ?Treating Demetric Parslow/Extender: Jeri Cos ?Weeks in Treatment: 247 ?Vital Signs ?Height(in): 71 ?Pulse(bpm): 88 ?Weight(lbs): 338 ?Blood Pressure(mmHg): 158/85 ?Body Mass Index(BMI): 47.1 ?Temperature(??F): 95.5 ?Respiratory Rate(breaths/min): 16 ?Photos: [N/A:N/A] ?Wound Location: Left, Lateral Lower Leg N/A N/A ?Wounding Event: Gradually Appeared N/A N/A ?Primary Etiology: Pyoderma N/A N/A ?Comorbid History: Sleep Apnea, Hypertension, Colitis N/A N/A ?Date Acquired: 11/18/2015 N/A N/A ?Weeks of Treatment: 247 N/A N/A ?Wound Status: Open N/A N/A ?Wound Recurrence: No N/A N/A ?Measurements  L x W x D (cm) 3.2x1.9x0.3 N/A N/A ?Area (cm?) : 4.775 N/A N/A ?Volume (cm?) : 1.433 N/A N/A ?% Reduction in Area: 2.70% N/A N/A ?% Reduction in Volume: 63.50% N/A N/A ?Classification: Full Thickness With Exposed N/A N/A ?Support Structures ?Exudate Amount: Medium N/A N/A ?Exudate Type: Serosanguineous N/A N/A ?Exudate Color: red, brown N/A N/A ?Wound Margin: Flat and Intact N/A N/A ?Granulation Amount: Large (67-100%) N/A N/A ?Granulation Quality: Red, Pink N/A N/A ?Necrotic Amount: Small (1-33%) N/A N/A ?Exposed Structures: ?Fat Layer (Subcutaneous Tissue): N/A N/A ?Yes ?Fascia: No ?Tendon: No ?Muscle: No ?Joint: No ?Bone: No ?Epithelialization: Medium (34-66%) N/A N/A ?Treatment Notes ?Electronic Signature(s) ?Signed: 08/31/2021 11:51:43 AM By: Donnamarie Poag ?Entered ByDonnamarie Poag on 08/31/2021 08:14:29 ?AARO, MEYERS  ([id number]) ?-------------------------------------------------------------------------------- ?Multi-Disciplinary Care Plan Details ?Patient Name: FABRIZZIO, MARCELLA ?Date of Service: 08/31/2021 8:00 AM ?Medical Record Number: [id number] ?Patient Account Number: [id number] ?Date of Birth/Sex: July 01, 1980 (41 y.o. M) ?Treating RN: Donnamarie Poag ?Primary Care Khalfani Weideman: [name] Other Clinician: ?Referring Narciso Stoutenburg: [name] ?Treating Sharne Linders/Extender: Jeri Cos ?Weeks in Treatment: 247 ??oo Multidisciplinary Care Plan reviewed with physician ?Active Inactive ?Necrotic Tissue ?Nursing Diagnoses: ?Impaired tissue integrity related to necrotic/devitalized tissue ?Knowledge deficit related to management of necrotic/devitalized tissue ?Goals: ?Necrotic/devitalized tissue will be minimized in the wound bed ?Date Initiated: 03/26/2021 ?Target Resolution Date: 08/24/2021 ?Goal Status: Active ?Patient/caregiver will verbalize understanding of reason and process for debridement of necrotic tissue ?Date Initiated: 03/26/2021 ?Date Inactivated: 04/27/2021 ?Target Resolution Date: 03/26/2021 ?Goal Status: Met ?Interventions: ?Assess patient pain level pre-, during and post procedure and prior to discharge ?Provide education on necrotic tissue and debridement process ?Treatment Activities: ?Apply topical anesthetic as ordered : 03/26/2021 ?Notes: ?Electronic Signature(s) ?Signed: 08/31/2021 11:51:43 AM By: Donnamarie Poag ?Entered ByDonnamarie Poag on 08/31/2021 08:13:22 ?MARLOS, CARMEN ([id number]) ?-------------------------------------------------------------------------------- ?Pain Assessment Details ?Patient Name: ALARIK, RADU ?Date of Service: 08/31/2021 8:00 AM ?Medical Record Number: [id number] ?Patient Account Number: [id number] ?Date of Birth/Sex: 1981-03-26 (41 y.o. M) ?Treating RN: Donnamarie Poag ?Primary Care Littleton Haub: [name] Other Clinician: ?Referring Jaelle Campanile: [name] ?Treating Esgar Barnick/Extender: Jeri Cos ?Weeks in Treatment: 247 ?Active Problems ?Location of Pain Severity and Description of Pain ?Patient Has Paino No ?Site Locations ?Rate the pain. ?Current Pain Level: 0 ?Pain Management and Medication ?Current Pain Management: ?Electronic Signature(s) ?Signed: 08/31/2021 11:51:43 AM By: Donnamarie Poag ?Entered ByDonnamarie Poag on 08/31/2021 08:07:00 ?ELVIE, MAINES ([id number]) ?---------------

## 2021-09-03 ENCOUNTER — Encounter: Payer: 59 | Admitting: Physician Assistant

## 2021-09-03 DIAGNOSIS — I872 Venous insufficiency (chronic) (peripheral): Secondary | ICD-10-CM | POA: Diagnosis not present

## 2021-09-03 NOTE — Progress Notes (Signed)
CASTULO, SCARPELLI ([id number]) ?Visit Report for 09/03/2021 ?Arrival Information Details ?Patient Name: [name], [name] ?Date of Service: 09/03/2021 8:15 AM ?Medical Record Number: [id number] ?Patient Account Number: [id number] ?Date of Birth/Sex: 1981/01/27 (41 y.o. M) ?Treating RN: Levora Dredge ?Primary Care Nicholl Onstott: [name] Other Clinician: ?Referring Evvie Behrmann: [name] ?Treating Klair Leising/Extender: Jeri Cos ?Weeks in Treatment: 247 ?Visit Information History Since Last Visit ?Added or deleted any medications: No ?Patient Arrived: Ambulatory ?Any new allergies or adverse reactions: No ?Arrival Time: 08:12 ?Had a fall or experienced change in No ?Accompanied By: self ?activities of daily living that may affect ?Transfer Assistance: None ?risk of falls: ?Patient Identification Verified: Yes ?Hospitalized since last visit: No ?Secondary Verification Process Completed: Yes ?Has Dressing in Place as Prescribed: Yes ?Patient Requires Transmission-Based No ?Has Compression in Place as Prescribed: Yes ?Precautions: ?Pain Present Now: No ?Patient Has Alerts: Yes ?Patient Alerts: Patient has reaction ?to ?silver dressings. ?Electronic Signature(s) ?Signed: 09/03/2021 4:49:52 PM By: Levora Dredge ?Entered By: Levora Dredge on 09/03/2021 08:13:30 ?[name], [name] ([id number]) ?-------------------------------------------------------------------------------- ?Clinic Level of Care Assessment Details ?Patient Name: DIXIE, JAFRI ?Date of Service: 09/03/2021 8:15 AM ?Medical Record Number: [id number] ?Patient Account Number: [id number] ?Date of Birth/Sex: 1980-06-03 (41 y.o. M) ?Treating RN: Levora Dredge ?Primary Care Bellamie Turney: [name] Other Clinician: ?Referring Sylar Voong: [name] ?Treating Overton Boggus/Extender: Jeri Cos ?Weeks in Treatment: 247 ?Clinic Level of Care Assessment Items ?TOOL 1 Quantity Score ?[]  - Use when EandM and Procedure is performed on INITIAL visit 0 ?ASSESSMENTS - Nursing  Assessment / Reassessment ?[]  - General Physical Exam (combine w/ comprehensive assessment (listed just below) when performed on new ?0 ?pt. evals) ?[]  - 0 ?Comprehensive Assessment (HX, ROS, Risk Assessments, Wounds Hx, etc.) ?ASSESSMENTS - Wound and Skin Assessment / Reassessment ?[]  - Dermatologic / Skin Assessment (not related to wound area) 0 ?ASSESSMENTS - Ostomy and/or Continence Assessment and Care ?[]  - Incontinence Assessment and Management 0 ?[]  - 0 ?Ostomy Care Assessment and Management (repouching, etc.) ?PROCESS - Coordination of Care ?[]  - Simple Patient / Family Education for ongoing care 0 ?[]  - 0 ?Complex (extensive) Patient / Family Education for ongoing care ?[]  - 0 ?Staff obtains Consents, Records, Test Results / Process Orders ?[]  - 0 ?Staff telephones [place], Nursing Homes / Clarify orders / etc ?[]  - 0 ?Routine Transfer to another Facility (non-emergent condition) ?[]  - 0 ?Routine Hospital Admission (non-emergent condition) ?[]  - 0 ?New Admissions / Biomedical engineer / Ordering NPWT, Apligraf, etc. ?[]  - 0 ?Emergency Hospital Admission (emergent condition) ?PROCESS - Special Needs ?[]  - Pediatric / Minor Patient Management 0 ?[]  - 0 ?Isolation Patient Management ?[]  - 0 ?Hearing / Language / Visual special needs ?[]  - 0 ?Assessment of Community assistance (transportation, D/C planning, etc.) ?[]  - 0 ?Additional assistance / Altered mentation ?[]  - 0 ?Support Surface(s) Assessment (bed, cushion, seat, etc.) ?INTERVENTIONS - Miscellaneous ?[]  - External ear exam 0 ?[]  - 0 ?Patient Transfer (multiple staff / Civil Service fast streamer / Similar devices) ?[]  - 0 ?Simple Staple / Suture removal (25 or less) ?[]  - 0 ?Complex Staple / Suture removal (26 or more) ?[]  - 0 ?Hypo/Hyperglycemic Management (do not check if billed separately) ?[]  - 0 ?Ankle / Brachial Index (ABI) - do not check if billed separately ?Has the patient been seen at the hospital within the last three years: Yes ?Total Score: 0 ?Level Of  Care: ____ ?[name], [name] ([id number]) ?Electronic Signature(s) ?Signed: 09/03/2021 4:49:52 PM By: Levora Dredge ?Entered By: Levora Dredge on 09/03/2021 08:32:12 ?[name], [name] ([id number]) ?-------------------------------------------------------------------------------- ?  Compression Therapy Details ?Patient Name: [name], [name] ?Date of Service: 09/03/2021 8:15 AM ?Medical Record Number: [id number] ?Patient Account Number: [id number] ?Date of Birth/Sex: November 19, 1980 (41 y.o. M) ?Treating RN: Levora Dredge ?Primary Care Palmina Clodfelter: [name] Other Clinician: ?Referring Aubriauna Riner: [name] ?Treating Jordan Caraveo/Extender: Jeri Cos ?Weeks in Treatment: 247 ?Compression Therapy Performed for Wound Assessment: Wound #1 Left,Lateral Lower Leg ?Performed By: Clinician Levora Dredge, RN ?Compression Type: Four Layer ?Pre Treatment ABI: 1.2 ?Notes ?pt tolerated wrap without issue ?Electronic Signature(s) ?Signed: 09/03/2021 4:49:52 PM By: Levora Dredge ?Entered By: Levora Dredge on 09/03/2021 08:30:59 ?[name], [name] ([id number]) ?-------------------------------------------------------------------------------- ?Encounter Discharge Information Details ?Patient Name: [name], [name] ?Date of Service: 09/03/2021 8:15 AM ?Medical Record Number: [id number] ?Patient Account Number: [id number] ?Date of Birth/Sex: [birth year] (41 y.o. M) ?Treating RN: Levora Dredge ?Primary Care Lestine Rahe: [name] Other Clinician: ?Referring Myiah Petkus: [name] ?Treating Semisi Biela/Extender: Jeri Cos ?Weeks in Treatment: 247 ?Encounter Discharge Information Items ?Discharge Condition: Stable ?Ambulatory Status: Ambulatory ?Discharge Destination: Home ?Transportation: Private Auto ?Accompanied By: self ?Schedule Follow-up Appointment: Yes ?Clinical Summary of Care: Patient Declined ?Electronic Signature(s) ?Signed: 09/03/2021 4:49:52 PM By: Levora Dredge ?Entered By: Levora Dredge on 09/03/2021 08:32:04 ?[name], [name]  ([id number]) ?-------------------------------------------------------------------------------- ?Wound Assessment Details ?Patient Name: [name], [name] ?Date of Service: 09/03/2021 8:15 AM ?Medical Record Number: [id number] ?Patient Account Number: [id number] ?Date of Birth/Sex: [birth year] (41 y.o. M) ?Treating RN: Levora Dredge ?Primary Care Lakenya Riendeau: [name] Other Clinician: ?Referring Priyanka Causey: [name] ?Treating Malone Vanblarcom/Extender: Jeri Cos ?Weeks in Treatment: 247 ?Wound Status ?Wound Number: 1 ?Primary Etiology: Pyoderma ?Wound Location: Left, Lateral Lower Leg ?Wound Status: Open ?Wounding Event: Gradually Appeared ?Comorbid History: Sleep Apnea, Hypertension, Colitis ?Date Acquired: 11/18/2015 ?Weeks Of Treatment: 247 ?Clustered Wound: No ?Wound Measurements ?Length: (cm) 3.2 ?Width: (cm) 1.9 ?Depth: (cm) 0.3 ?Area: (cm?) 4.775 ?Volume: (cm?) 1.433 ?% Reduction in Area: 2.7% ?% Reduction in Volume: 63.5% ?Epithelialization: Medium (34-66%) ?Tunneling: No ?Undermining: No ?Wound Description ?Classification: Full Thickness With Exposed Support Structures ?Wound Margin: Flat and Intact ?Exudate Amount: Medium ?Exudate Type: Serosanguineous ?Exudate Color: red, brown ?Foul Odor After Cleansing: No ?Slough/Fibrino Yes ?Wound Bed ?Granulation Amount: Large (67-100%) Exposed Structure ?Granulation Quality: Red, Pink ?Fascia Exposed: No ?Necrotic Amount: Small (1-33%) ?Fat Layer (Subcutaneous Tissue) Exposed: Yes ?Necrotic Quality: Adherent Slough ?Tendon Exposed: No ?Muscle Exposed: No ?Joint Exposed: No ?Bone Exposed: No ?Treatment Notes ?Wound #1 (Lower Leg) Wound Laterality: Left, Lateral ?Cleanser ?Wound Cleanser ?Discharge Instruction: Wash your hands with soap and water. Remove old dressing, discard into plastic bag and place into trash. ?Cleanse the wound with Wound Cleanser prior to applying a clean dressing using gauze sponges, not tissues or cotton balls. Do ?not scrub or use excessive force.  Pat dry using gauze sponges, not tissue or cotton balls. ?Peri-Wound Care ?Topical ?Clobetasol Propionate ointment 0.05%, 60 (g) tube ?Discharge Instruction: Pt to bring to appt- applied to wound bed ?Primary Dressing ?H

## 2021-09-03 NOTE — Progress Notes (Signed)
GILES, CURRIE ([id number]) ?Visit Report for 09/03/2021 ?Physician Orders Details ?Patient Name: [name], [name] ?Date of Service: 09/03/2021 8:15 AM ?Medical Record Number: [id number] ?Patient Account Number: [id number] ?Date of Birth/Sex: [birth year] (41 y.o. M) ?Treating RN: Levora Dredge ?Primary Care Provider: [name] Other Clinician: ?Referring Provider: [name] ?Treating Provider/Extender: Jeri Cos ?Weeks in Treatment: 247 ?Verbal / Phone Orders: No ?Diagnosis Coding ?Follow-up Appointments ?o Return Appointment in 1 week. ?o Nurse Visit as needed - twice a week ?Bathing/ Shower/ Hygiene ?o Clean wound with Normal Saline or wound cleanser. ?o May shower with wound dressing protected with water repellent cover or cast protector. ?o No tub bath. ?Edema Control - Lymphedema / Segmental Compressive Device / Other ?o Optional: One layer of unna paste to top of compression wrap (to act as an anchor). ?o Elevate, Exercise Daily and Avoid Standing for Long Periods of Time. ?o Elevate legs to the level of the heart and pump ankles as often as possible ?o Elevate leg(s) parallel to the floor when sitting. ?Medications-Please add to medication list. ?o P.O. Antibiotics - continue as prescribed ?Wound Treatment ?Wound #1 - Lower Leg Wound Laterality: Left, Lateral ?Cleanser: Wound Cleanser 3 x Per Week/30 Days ?Discharge Instructions: Wash your hands with soap and water. Remove old dressing, discard into plastic bag and place into trash. ?Cleanse the wound with Wound Cleanser prior to applying a clean dressing using gauze sponges, not tissues or cotton balls. Do not ?scrub or use excessive force. Pat dry using gauze sponges, not tissue or cotton balls. ?Topical: Clobetasol Propionate ointment 0.05%, 60 (g) tube 3 x Per Week/30 Days ?Discharge Instructions: Pt to bring to appt- applied to wound bed ?Primary Dressing: Hydrofera Blue Ready Transfer Foam, 4x5 (in/in) 3 x Per Week/30  Days ?Discharge Instructions: Apply Hydrofera Blue Ready to wound bed as directed ?Secondary Dressing: (NON-BORDER) Zetuvit Plus Silicone NON-BORDER 5x5 (in/in) 3 x Per Week/30 Days ?Compression Wrap: Medichoice 4 layer Compression System, 35-40 mmHG (Generic) 3 x Per Week/30 Days ?Discharge Instructions: Apply multi-layer wrap as directed. ?Electronic Signature(s) ?Signed: 09/03/2021 4:40:44 PM By: Worthy Keeler PA-C ?Signed: 09/03/2021 4:49:52 PM By: Levora Dredge ?Entered By: Levora Dredge on 09/03/2021 08:31:37 ?[name], [name] ([id number]) ?-------------------------------------------------------------------------------- ?SuperBill Details ?Patient Name: [name], [name] ?Date of Service: 09/03/2021 ?Medical Record Number: [id number] ?Patient Account Number: [id number] ?Date of Birth/Sex: 10-03-1980 (41 y.o. 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

## 2021-09-05 DIAGNOSIS — I872 Venous insufficiency (chronic) (peripheral): Secondary | ICD-10-CM | POA: Diagnosis not present

## 2021-09-05 NOTE — Progress Notes (Signed)
KHYE, HOCHSTETLER ([id number]) ?Visit Report for 09/05/2021 ?Physician Orders Details ?Patient Name: [name], [name] ?Date of Service: 09/05/2021 8:00 AM ?Medical Record Number: [id number] ?Patient Account Number: [id number] ?Date of Birth/Sex: [birth year] (41 y.o. M) ?Treating RN: Donnamarie Poag ?Primary Care Provider: [name] Other Clinician: ?Referring Provider: [name] ?Treating Provider/Extender: Kalman Shan ?Weeks in Treatment: 248 ?Verbal / Phone Orders: No ?Diagnosis Coding ?Follow-up Appointments ?o Return Appointment in 1 week. ?o Nurse Visit as needed - twice a week ?Bathing/ Shower/ Hygiene ?o Clean wound with Normal Saline or wound cleanser. ?o May shower with wound dressing protected with water repellent cover or cast protector. ?o No tub bath. ?Edema Control - Lymphedema / Segmental Compressive Device / Other ?o Optional: One layer of unna paste to top of compression wrap (to act as an anchor). ?o Elevate, Exercise Daily and Avoid Standing for Long Periods of Time. ?o Elevate legs to the level of the heart and pump ankles as often as possible ?o Elevate leg(s) parallel to the floor when sitting. ?Medications-Please add to medication list. ?o P.O. Antibiotics - continue as prescribed ?Wound Treatment ?Wound #1 - Lower Leg Wound Laterality: Left, Lateral ?Cleanser: Wound Cleanser 3 x Per Week/30 Days ?Discharge Instructions: Wash your hands with soap and water. Remove old dressing, discard into plastic bag and place into trash. ?Cleanse the wound with Wound Cleanser prior to applying a clean dressing using gauze sponges, not tissues or cotton balls. Do not ?scrub or use excessive force. Pat dry using gauze sponges, not tissue or cotton balls. ?Topical: Clobetasol Propionate ointment 0.05%, 60 (g) tube 3 x Per Week/30 Days ?Discharge Instructions: Pt to bring to appt- applied to wound bed ?Primary Dressing: Hydrofera Blue Ready Transfer Foam, 4x5 (in/in) 3 x Per Week/30  Days ?Discharge Instructions: Apply Hydrofera Blue Ready to wound bed as directed ?Secondary Dressing: (NON-BORDER) Zetuvit Plus Silicone NON-BORDER 5x5 (in/in) 3 x Per Week/30 Days ?Compression Wrap: Medichoice 4 layer Compression System, 35-40 mmHG (Generic) 3 x Per Week/30 Days ?Discharge Instructions: Apply multi-layer wrap as directed. ?Electronic Signature(s) ?Signed: 09/05/2021 8:50:20 AM By: Kalman Shan DO ?Signed: 09/05/2021 3:02:04 PM By: Donnamarie Poag ?Entered ByDonnamarie Poag on 09/05/2021 08:21:40 ?[name], [name] ([id number]) ?-------------------------------------------------------------------------------- ?SuperBill Details ?Patient Name: [name], [name] ?Date of Service: 09/05/2021 ?Medical Record Number: [id number] ?Patient Account Number: [id number] ?Date of Birth/Sex: [birth year] (41 y.o. M) ?Treating RN: Donnamarie Poag ?Primary Care Provider: [name] Other Clinician: ?Referring Provider: [name] ?Treating Provider/Extender: Kalman Shan ?Weeks in Treatment: 248 ?Diagnosis Coding ?ICD-10 Codes ?Code Description ?I87.2 Venous insufficiency (chronic) (peripheral) ?D53.299 Non-pressure chronic ulcer of left calf with fat layer exposed ?E11.622 Type 2 diabetes mellitus with other skin ulcer ?L88 Pyoderma gangrenosum ?F17.208 Nicotine dependence, unspecified, with other nicotine-induced disorders ?Facility Procedures ?CPT4 Code: 24268341 ?Description: (Facility Use Only) [phone] - APPLY MULTLAY COMPRS LWR LT LEG ?Modifier: ?Quantity: 1 ?Electronic Signature(s) ?Signed: 09/05/2021 8:50:20 AM By: Kalman Shan DO ?Signed: 09/05/2021 3:02:04 PM By: Donnamarie Poag ?Entered ByDonnamarie Poag on 09/05/2021 08:23:10 ?

## 2021-09-05 NOTE — Progress Notes (Signed)
NICHALAS, COIN ([id number]) ?Visit Report for 09/05/2021 ?Arrival Information Details ?Patient Name: [name], [name] ?Date of Service: 09/05/2021 8:00 AM ?Medical Record Number: [id number] ?Patient Account Number: [id number] ?Date of Birth/Sex: 1980/05/27 (41 y.o. M) ?Treating RN: Donnamarie Poag ?Primary Care Bryley Kovacevic: [name] Other Clinician: ?Referring Dayon Witt: [name] ?Treating Sherrina Zaugg/Extender: Kalman Shan ?Weeks in Treatment: 248 ?Visit Information History Since Last Visit ?Added or deleted any medications: No ?Patient Arrived: Ambulatory ?Had a fall or experienced change in No ?Arrival Time: 08:05 ?activities of daily living that may affect ?Accompanied By: self ?risk of falls: ?Transfer Assistance: None ?Hospitalized since last visit: No ?Patient Identification Verified: Yes ?Has Dressing in Place as Prescribed: Yes ?Secondary Verification Process Completed: Yes ?Has Compression in Place as Prescribed: Yes ?Patient Requires Transmission-Based No ?Pain Present Now: No ?Precautions: ?Patient Has Alerts: Yes ?Patient Alerts: Patient has reaction ?to ?silver dressings. ?Electronic Signature(s) ?Signed: 09/05/2021 3:02:04 PM By: Donnamarie Poag ?Entered ByDonnamarie Poag on 09/05/2021 08:18:50 ?[name], [name] ([id number]) ?-------------------------------------------------------------------------------- ?Compression Therapy Details ?Patient Name: [name], [name] ?Date of Service: 09/05/2021 8:00 AM ?Medical Record Number: [id number] ?Patient Account Number: [id number] ?Date of Birth/Sex: December 07, 1980 (41 y.o. M) ?Treating RN: Donnamarie Poag ?Primary Care Aubree Doody: [name] Other Clinician: ?Referring Jenae Tomasello: [name] ?Treating Lark Runk/Extender: Kalman Shan ?Weeks in Treatment: 248 ?Compression Therapy Performed for Wound Assessment: Wound #1 Left,Lateral Lower Leg ?Performed By: Clinician Donnamarie Poag, RN ?Compression Type: Four Layer ?Electronic Signature(s) ?Signed: 09/05/2021 3:02:04 PM By: Donnamarie Poag ?Entered ByDonnamarie Poag on 09/05/2021 08:19:57 ?PAO, HAFFEY ([id number]) ?-------------------------------------------------------------------------------- ?Encounter Discharge Information Details ?Patient Name: [name], [name] ?Date of Service: 09/05/2021 8:00 AM ?Medical Record Number: [id number] ?Patient Account Number: [id number] ?Date of Birth/Sex: 1981-01-30 (41 y.o. M) ?Treating RN: Donnamarie Poag ?Primary Care Ralphael Southgate: [name] Other Clinician: ?Referring Brondon Wann: [name] ?Treating Dakari Cregger/Extender: Kalman Shan ?Weeks in Treatment: 248 ?Encounter Discharge Information Items ?Discharge Condition: Stable ?Ambulatory Status: Ambulatory ?Discharge Destination: Home ?Transportation: Private Auto ?Accompanied By: self ?Schedule Follow-up Appointment: Yes ?Clinical Summary of Care: ?Electronic Signature(s) ?Signed: 09/05/2021 3:02:04 PM By: Donnamarie Poag ?Entered ByDonnamarie Poag on 09/05/2021 08:22:56 ?[name], [name] ([id number]) ?-------------------------------------------------------------------------------- ?Wound Assessment Details ?Patient Name: [name], [name] ?Date of Service: 09/05/2021 8:00 AM ?Medical Record Number: [id number] ?Patient Account Number: [id number] ?Date of Birth/Sex: Apr 13, 1981 (41 y.o. M) ?Treating RN: Donnamarie Poag ?Primary Care Zanyia Silbaugh: [name] Other Clinician: ?Referring Ghada Abbett: [name] ?Treating Craigory Toste/Extender: Kalman Shan ?Weeks in Treatment: 248 ?Wound Status ?Wound Number: 1 ?Primary Etiology: Pyoderma ?Wound Location: Left, Lateral Lower Leg ?Wound Status: Open ?Wounding Event: Gradually Appeared ?Comorbid History: Sleep Apnea, Hypertension, Colitis ?Date Acquired: 11/18/2015 ?Weeks Of Treatment: 248 ?Clustered Wound: No ?Wound Measurements ?Length: (cm) 3.2 ?Width: (cm) 1.9 ?Depth: (cm) 0.3 ?Area: (cm?) 4.775 ?Volume: (cm?) 1.433 ?% Reduction in Area: 2.7% ?% Reduction in Volume: 63.5% ?Epithelialization: Medium (34-66%) ?Wound  Description ?Classification: Full Thickness With Exposed Support Structures ?Wound Margin: Flat and Intact ?Exudate Amount: Medium ?Exudate Type: Serosanguineous ?Exudate Color: red, brown ?Foul Odor After Cleansing: No ?Slough/Fibrino Yes ?Wound Bed ?Granulation Amount: Large (67-100%) Exposed Structure ?Granulation Quality: Red, Pink ?Fascia Exposed: No ?Necrotic Amount: Small (1-33%) ?Fat Layer (Subcutaneous Tissue) Exposed: Yes ?Necrotic Quality: Adherent Slough ?Tendon Exposed: No ?Muscle Exposed: No ?Joint Exposed: No ?Bone Exposed: No ?Treatment Notes ?Wound #1 (Lower Leg) Wound Laterality: Left, Lateral ?Cleanser ?Wound Cleanser ?Discharge Instruction: Wash your hands with soap and water. Remove old dressing, discard into plastic bag and place into trash. ?Cleanse the wound with Wound Cleanser prior to applying a clean dressing using gauze sponges,  not tissues or cotton balls. Do ?not scrub or use excessive force. Pat dry using gauze sponges, not tissue or cotton balls. ?Peri-Wound Care ?Topical ?Clobetasol Propionate ointment 0.05%, 60 (g) tube ?Discharge Instruction: Pt to bring to appt- applied to wound bed ?Primary Dressing ?Hydrofera Blue Ready Transfer Foam, 4x5 (in/in) ?Discharge Instruction: Apply Hydrofera Blue Ready to wound bed as directed ?Secondary Dressing ?(NON-BORDER) Zetuvit Plus Silicone NON-BORDER 5x5 (in/in) ?Secured With ?MIECZYSLAW, STAMAS ([id number]) ?Compression Wrap ?Medichoice 4 layer Compression System, 35-40 mmHG ?Discharge Instruction: Apply multi-layer wrap as directed. ?Compression Stockings ?Add-Ons ?Electronic Signature(s) ?Signed: 09/05/2021 3:02:04 PM By: Donnamarie Poag ?Entered ByDonnamarie Poag on 09/05/2021 51:68:61 ?

## 2021-09-07 DIAGNOSIS — I872 Venous insufficiency (chronic) (peripheral): Secondary | ICD-10-CM | POA: Diagnosis not present

## 2021-09-07 NOTE — Unmapped (Signed)
[facility] Update for your review

## 2021-09-07 NOTE — Progress Notes (Signed)
ZAVIEN, CLUBB ([id number]) ?Visit Report for 09/07/2021 ?Physician Orders Details ?Patient Name: [name], [name] ?Date of Service: 09/07/2021 8:00 AM ?Medical Record Number: [id number] ?Patient Account Number: [id number] ?Date of Birth/Sex: 05-May-1981 (40 y.o. M) ?Treating RN: [name] ?Primary Care Provider: [name] Other Clinician: ?Referring Provider: [name] ?Treating Provider/Extender: Jeri Cos ?Weeks in Treatment: 248 ?Verbal / Phone Orders: No ?Diagnosis Coding ?Follow-up Appointments ?o Return Appointment in 1 week. ?o Nurse Visit as needed - twice a week ?Bathing/ Shower/ Hygiene ?o Clean wound with Normal Saline or wound cleanser. ?o May shower with wound dressing protected with water repellent cover or cast protector. ?o No tub bath. ?Edema Control - Lymphedema / Segmental Compressive Device / Other ?o Optional: One layer of unna paste to top of compression wrap (to act as an anchor). ?o Elevate, Exercise Daily and Avoid Standing for Long Periods of Time. ?o Elevate legs to the level of the heart and pump ankles as often as possible ?o Elevate leg(s) parallel to the floor when sitting. ?Medications-Please add to medication list. ?o P.O. Antibiotics - continue as prescribed ?Wound Treatment ?Wound #1 - Lower Leg Wound Laterality: Left, Lateral ?Cleanser: Wound Cleanser 3 x Per Week/30 Days ?Discharge Instructions: Wash your hands with soap and water. Remove old dressing, discard into plastic bag and place into trash. ?Cleanse the wound with Wound Cleanser prior to applying a clean dressing using gauze sponges, not tissues or cotton balls. Do not ?scrub or use excessive force. Pat dry using gauze sponges, not tissue or cotton balls. ?Topical: Clobetasol Propionate ointment 0.05%, 60 (g) tube 3 x Per Week/30 Days ?Discharge Instructions: Pt to bring to appt- applied to wound bed ?Primary Dressing: Hydrofera Blue Ready Transfer Foam, 4x5 (in/in) 3 x Per Week/30  Days ?Discharge Instructions: Apply Hydrofera Blue Ready to wound bed as directed ?Secondary Dressing: (NON-BORDER) Zetuvit Plus Silicone NON-BORDER 5x5 (in/in) 3 x Per Week/30 Days ?Compression Wrap: Medichoice 4 layer Compression System, 35-40 mmHG (Generic) 3 x Per Week/30 Days ?Discharge Instructions: Apply multi-layer wrap as directed. ?Electronic Signature(s) ?Signed: 09/07/2021 2:32:46 PM By: [name], [name], RN, [name], [name], [name] ?Signed: 09/07/2021 5:32:35 PM By: Worthy Keeler PA-C ?Entered By: [name], [name], RN, [name], [name] on 09/07/2021 08:23:09 ?VINNIE, GOMBERT ([id number]) ?-------------------------------------------------------------------------------- ?SuperBill Details ?Patient Name: [name], [name] ?Date of Service: 09/07/2021 ?Medical Record Number: [id number] ?Patient Account Number: [id number] ?Date of Birth/Sex: [birth year] (41 y.o. 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

## 2021-09-07 NOTE — Progress Notes (Signed)
BASIR, NIVEN ([id number]) ?Visit Report for 09/07/2021 ?Arrival Information Details ?Patient Name: [name], [name] ?Date of Service: 09/07/2021 8:00 AM ?Medical Record Number: [id number] ?Patient Account Number: [id number] ?Date of Birth/Sex: 12/25/1980 (41 y.o. M) ?Treating RN: [name] ?Primary Care Yoneko Talerico: [name] Other Clinician: ?Referring Corgan Mormile: [name] ?Treating Glade Strausser/Extender: Jeri Cos ?Weeks in Treatment: 248 ?Visit Information History Since Last Visit ?Added or deleted any medications: No ?Patient Arrived: Ambulatory ?Has Dressing in Place as Prescribed: Yes ?Arrival Time: 08:10 ?Has Compression in Place as Prescribed: Yes ?Transfer Assistance: None ?Pain Present Now: No ?Patient Identification Verified: Yes ?Secondary Verification Process Completed: Yes ?Patient Requires Transmission-Based No ?Precautions: ?Patient Has Alerts: Yes ?Patient Alerts: Patient has reaction ?to ?silver dressings. ?Electronic Signature(s) ?Signed: 09/07/2021 2:32:46 PM By: [name], [name], RN, [name], [name], [name] ?Entered By: [name], [name], RN, [name], [name] on 09/07/2021 08:12:12 ?[name], [name] ([id number]) ?-------------------------------------------------------------------------------- ?Encounter Discharge Information Details ?Patient Name: [name], [name] ?Date of Service: 09/07/2021 8:00 AM ?Medical Record Number: [id number] ?Patient Account Number: [id number] ?Date of Birth/Sex: December 06, 1980 (41 y.o. M) ?Treating RN: [name] ?Primary Care Teila Skalsky: [name] Other Clinician: ?Referring Jahkeem Kurka: [name] ?Treating Adekunle Rohrbach/Extender: Jeri Cos ?Weeks in Treatment: 248 ?Encounter Discharge Information Items ?Discharge Condition: Stable ?Ambulatory Status: Ambulatory ?Discharge Destination: Home ?Transportation: Private Auto ?Accompanied By: self ?Schedule Follow-up Appointment: Yes ?Clinical Summary of Care: ?Electronic Signature(s) ?Signed: 09/07/2021 2:32:46 PM By: [name], [name], RN, [name], [name], [name] ?Entered  By: [name], [name], RN, [name], [name] on 09/07/2021 08:23:40 ?[name], [name] ([id number]) ?-------------------------------------------------------------------------------- ?Wound Assessment Details ?Patient Name: [name], [name] ?Date of Service: 09/07/2021 8:00 AM ?Medical Record Number: [id number] ?Patient Account Number: [id number] ?Date of Birth/Sex: Aug 15, 1980 (41 y.o. M) ?Treating RN: [name] ?Primary Care Kirstin Kugler: [name] Other Clinician: ?Referring Afsana Liera: [name] ?Treating Miciah Shealy/Extender: Jeri Cos ?Weeks in Treatment: 248 ?Wound Status ?Wound Number: 1 ?Primary Etiology: Pyoderma ?Wound Location: Left, Lateral Lower Leg ?Wound Status: Open ?Wounding Event: Gradually Appeared ?Comorbid History: Sleep Apnea, Hypertension, Colitis ?Date Acquired: 11/18/2015 ?Weeks Of Treatment: 248 ?Clustered Wound: No ?Photos ?Photo Uploaded By: [name], [name], RN, [name], [name] on 09/07/2021 15:47:50 ?Wound Measurements ?Length: (cm) 3.2 ?Width: (cm) 1.9 ?Depth: (cm) 0.3 ?Area: (cm?) 4.775 ?Volume: (cm?) 1.433 ?% Reduction in Area: 2.7% ?% Reduction in Volume: 63.5% ?Epithelialization: Medium (34-66%) ?Wound Description ?Classification: Full Thickness With Exposed Support Structures ?Wound Margin: Flat and Intact ?Exudate Amount: Medium ?Exudate Type: Serosanguineous ?Exudate Color: red, brown ?Foul Odor After Cleansing: No ?Slough/Fibrino Yes ?Wound Bed ?Granulation Amount: Large (67-100%) Exposed Structure ?Granulation Quality: Red, Pink ?Fascia Exposed: No ?Necrotic Amount: Small (1-33%) ?Fat Layer (Subcutaneous Tissue) Exposed: Yes ?Necrotic Quality: Adherent Slough ?Tendon Exposed: No ?Muscle Exposed: No ?Joint Exposed: No ?Bone Exposed: No ?Treatment Notes ?Wound #1 (Lower Leg) Wound Laterality: Left, Lateral ?Cleanser ?Wound Cleanser ?Discharge Instruction: Wash your hands with soap and water. Remove old dressing, discard into plastic bag and place into trash. ?Cleanse the wound with Wound Cleanser prior to applying  a clean dressing using gauze sponges, not tissues or cotton balls. [name], [name] ([id number]) ?not scrub or use excessive force. Pat dry using gauze sponges, not tissue or cotton balls. ?Peri-Wound Care ?Topical ?Clobetasol Propionate ointment 0.05%, 60 (g) tube ?Discharge Instruction: Pt to bring to appt- applied to wound bed ?Primary Dressing ?Hydrofera Blue Ready Transfer Foam, 4x5 (in/in) ?Discharge Instruction: Apply Hydrofera Blue Ready to wound bed as directed ?Secondary Dressing ?(NON-BORDER) Zetuvit Plus Silicone NON-BORDER 5x5 (in/in) ?Secured With ?Compression Wrap ?Medichoice 4 layer Compression System, 35-40 mmHG ?Discharge Instruction: Apply multi-layer  wrap as directed. ?Compression Stockings ?Add-Ons ?Electronic Signature(s) ?Signed: 09/07/2021 2:32:46 PM By: [name], [name], RN, [name], [name], [name] ?Entered By: [name], [name], RN, [name], [name] on 09/07/2021 08:12:55 ?

## 2021-09-10 ENCOUNTER — Encounter: Payer: 59 | Admitting: Physician Assistant

## 2021-09-10 DIAGNOSIS — I872 Venous insufficiency (chronic) (peripheral): Secondary | ICD-10-CM | POA: Diagnosis not present

## 2021-09-10 NOTE — Progress Notes (Addendum)
KERRY, CHISOLM ([id number]) ?Visit Report for 09/10/2021 ?Chief Complaint Document Details ?Patient Name: EARNESTINE, TUOHEY ?Date of Service: 09/10/2021 8:15 AM ?Medical Record Number: [id number] ?Patient Account Number: [id number] ?Date of Birth/Sex: [birth year] (41 y.o. M) ?Treating RN: Levora Dredge ?Primary Care Provider: [name] Other Clinician: ?Referring Provider: [name] ?Treating Provider/Extender: Jeri Cos ?Weeks in Treatment: 248 ?Information Obtained from: Patient ?Chief Complaint ?He is here in follow up evaluation for LLE pyoderma ulcer ?Electronic Signature(s) ?Signed: 09/10/2021 8:09:45 AM By: Worthy Keeler PA-C ?Entered By: Worthy Keeler on 09/10/2021 08:09:45 ?KAZDEN, LARGO ([id number]) ?-------------------------------------------------------------------------------- ?HPI Details ?Patient Name: LARY, ECKARDT ?Date of Service: 09/10/2021 8:15 AM ?Medical Record Number: [id number] ?Patient Account Number: [id number] ?Date of Birth/Sex: 1981-04-28 (41 y.o. M) ?Treating RN: Levora Dredge ?Primary Care Provider: [name] Other Clinician: ?Referring Provider: [name] ?Treating Provider/Extender: Jeri Cos ?Weeks in Treatment: 248 ?History of Present Illness ?HPI Description: 12/04/16; 41 year old man who comes into the clinic today for review of a wound on the posterior left calf. He tells me that is ?been there for about a year. He is not a diabetic he does smoke half a pack per day. He was seen in the ER on 11/20/16 felt to have cellulitis around ?the wound and was given clindamycin. An x-ray did not show osteomyelitis. The patient initially tells me that he has a milk allergy that sets off a ?pruritic itching rash on his lower legs which she scratches incessantly and he thinks that's what may have set up the wound. He has been using ?various topical antibiotics and ointments without any effect. He works in a [employer]ing [employer] and is on his feet all day. He does not have a  prior ?history of wounds however he does have the rash on both lower legs the right arm and the ventral aspect of his left arm. These are excoriations ?and clearly have had scratching however there are of macular looking areas on both legs including a substantial larger area on the right leg. This ?does not have an underlying open area. There is no blistering. 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 He seemed to recognize this. On further questioning he admits ?to a 5 year history of quiesced. ulcerative colitis. He is not in any treatment for this. He's had no recent travel ?12/11/16; the patient arrives today with his wound and roughly the same condition we've been using silver alginate this is a deep punched out ?wound with some surrounding erythema but no tenderness. 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 I'm really not able to understand this ?We have also received information from the patient's dermatologist in [place] notes from April 2016. This was a doctor Agarwal-antal. 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 He was prescribed topical steroid high potency under occlusion which helped but at this point the ?patient did not have a deep punched out wound. ?12/18/16; the patient's wound is larger in terms of surface area however this surface looks better and there is less depth. The surrounding erythema ?also is better. The patient states that the wrap we put on came off 2 days ago when he has been using his compression stockings.  He we are in ?the process of getting a dermatology consult. ?12/26/16 on evaluation today patient's left lower extremity wound shows evidence of  infection with surrounding erythema noted. He has been ?tolerating the dressing changes but states that he has noted more discomfort. There is a larger area of erythema surrounding the wound. No ?fevers, chills, nausea, or vomiting noted at this time. With that being said the wound still does have slough covering the surface. He is not allergic ?to any medication that he is aware of at this point. In regard to his right lower extremity he had several regions that are erythematous and ?pruritic he wonders if there's anything we can do to help that. ?01/02/17 I reviewed patient's wound culture which was obtained his visit last week. He was placed on doxycycline at that point. Unfortunately that ?does not appear to be an antibiotic that would likely help with the situation however the pseudomonas noted on culture is sensitive to Cipro. Also ?unfortunately patient's wound seems to have a large compared to last week's evaluation. Not severely so but there are definitely increased ?measurements in general. He is continuing to have discomfort as well he writes this to be a seven out of 10. In fact he would prefer me not to ?perform any debridement today due to the fact that he is having discomfort and considering he has an active infection on the little reluctant to do ?so anyway. No fevers, chills, nausea, or vomiting noted at this time. ?01/08/17; patient seems dermatology on September 5. I suspect dermatology will want the slides from the biopsy I did sent to their pathologist. ?I'm not sure if there is a way we can expedite that. In any case the culture I did before I left on vacation 3 weeks ago showed Pseudomonas he ?was given 10 days of Cipro and per her description of her intake nurses is actually somewhat better this week although the wound is quite a bit ?bigger than I remember the last time I saw this. He still has 3 more days of Cipro ?01/21/17; dermatology appointment tomorrow. He has completed the ciprofloxacin  for Pseudomonas. Surface of the wound looks better however he ?is had some deterioration in the lesions on his right leg. Meantime the left lateral leg wound we will continue with sample ?01/29/17; patient had his dermatology appointment but I can't yet see that note. He is completed his antibiotics. The wound is more superficial but ?considerably larger in circumferential area than when he came in. This is in his left lateral calf. He also has swollen erythematous areas with ?superficial wounds on the right leg and small papular areas on both arms. There apparently areas in her his upper thighs and buttocks I did not ?look at those. Dermatology biopsied the right leg. Hopefully will have their input next week. ?02/05/17; patient went back to see his dermatologist who told him that he had a "scratching problem" as well as staph. He is now on a 30 day ?course of doxycycline and I believe she gave him triamcinolone cream to the right leg areas to help with the itching [not exactly sure but probably ?triamcinolone]. She apparently looked at the left lateral leg wound although this was not rebiopsied and I think felt to be ultimately part of the ?same pathogenesis. He is using sample border foam and changing nevus himself. He now has a new open area on the right posterior leg which ?was his biopsy site ?I don't have any of the dermatology notes ?  02/12/17; we put the patient in compression last week with SANTYL to the wound on the left leg and the biopsy. Edema is much better and the ?depth of the wound is now at level of skin. Area is still the same ?oBiopsy site on the right lateral leg we've also been using santyl with a border foam dressing and he is changing this himself. ?02/19/17; Using silver alginate started last week to both the substantial left leg wound and the biopsy site on the right wound. He is tolerating ?compression well. Has a an appointment with his primary M.D. tomorrow wondering about diuretics although  I'm wondering if the edema problem ?is actually lymphedema ?02/26/17; the patient has been to see his primary doctor Dr. Jerrel Ivory at [facility] our primary care. She started him on Lasix 20 mg and this ?seem

## 2021-09-10 NOTE — Progress Notes (Addendum)
TANK, DIFIORE ([id number]) ?Visit Report for 09/10/2021 ?Arrival Information Details ?Patient Name: [name], [name] ?Date of Service: 09/10/2021 8:15 AM ?Medical Record Number: [id number] ?Patient Account Number: [id number] ?Date of Birth/Sex: [birth year] (41 y.o. M) ?Treating RN: Levora Dredge ?Primary Care Pietrina Jagodzinski: [name] Other Clinician: ?Referring Baljit Liebert: [name] ?Treating Iceis Knab/Extender: Jeri Cos ?Weeks in Treatment: 248 ?Visit Information History Since Last Visit ?Added or deleted any medications: No ?Patient Arrived: Ambulatory ?Any new allergies or adverse reactions: No ?Arrival Time: 08:09 ?Had a fall or experienced change in No ?Accompanied By: self ?activities of daily living that may affect ?Transfer Assistance: None ?risk of falls: ?Patient Identification Verified: Yes ?Hospitalized since last visit: No ?Secondary Verification Process Completed: Yes ?Has Dressing in Place as Prescribed: Yes ?Patient Requires Transmission-Based No ?Has Compression in Place as Prescribed: Yes ?Precautions: ?Pain Present Now: No ?Patient Has Alerts: Yes ?Patient Alerts: Patient has reaction ?to ?silver dressings. ?Electronic Signature(s) ?Signed: 09/10/2021 4:22:54 PM By: Levora Dredge ?Entered By: Levora Dredge on 09/10/2021 08:10:46 ?VALENTE, FOSBERG ([id number]) ?-------------------------------------------------------------------------------- ?Clinic Level of Care Assessment Details ?Patient Name: [name] ?Date of Service: 09/10/2021 8:15 AM ?Medical Record Number: [id number] ?Patient Account Number: [id number] ?Date of Birth/Sex: May 10, 1981 (41 y.o. M) ?Treating RN: Levora Dredge ?Primary Care Arnaldo Heffron: [name] Other Clinician: ?Referring Ruthia Person: [name] ?Treating Valree Feild/Extender: Jeri Cos ?Weeks in Treatment: 248 ?Clinic Level of Care Assessment Items ?TOOL 1 Quantity Score ?[]  - Use when EandM and Procedure is performed on INITIAL visit 0 ?ASSESSMENTS - Nursing  Assessment / Reassessment ?[]  - General Physical Exam (combine w/ comprehensive assessment (listed just below) when performed on new ?0 ?pt. evals) ?[]  - 0 ?Comprehensive Assessment (HX, ROS, Risk Assessments, Wounds Hx, etc.) ?ASSESSMENTS - Wound and Skin Assessment / Reassessment ?[]  - Dermatologic / Skin Assessment (not related to wound area) 0 ?ASSESSMENTS - Ostomy and/or Continence Assessment and Care ?[]  - Incontinence Assessment and Management 0 ?[]  - 0 ?Ostomy Care Assessment and Management (repouching, etc.) ?PROCESS - Coordination of Care ?[]  - Simple Patient / Family Education for ongoing care 0 ?[]  - 0 ?Complex (extensive) Patient / Family Education for ongoing care ?[]  - 0 ?Staff obtains Consents, Records, Test Results / Process Orders ?[]  - 0 ?Staff telephones HHA, Nursing Homes / Clarify orders / etc ?[]  - 0 ?Routine Transfer to another Facility (non-emergent condition) ?[]  - 0 ?Routine Hospital Admission (non-emergent condition) ?[]  - 0 ?New Admissions / Biomedical engineer / Ordering NPWT, Apligraf, etc. ?[]  - 0 ?Emergency Hospital Admission (emergent condition) ?PROCESS - Special Needs ?[]  - Pediatric / Minor Patient Management 0 ?[]  - 0 ?Isolation Patient Management ?[]  - 0 ?Hearing / Language / Visual special needs ?[]  - 0 ?Assessment of Community assistance (transportation, D/C planning, etc.) ?[]  - 0 ?Additional assistance / Altered mentation ?[]  - 0 ?Support Surface(s) Assessment (bed, cushion, seat, etc.) ?INTERVENTIONS - Miscellaneous ?[]  - External ear exam 0 ?[]  - 0 ?Patient Transfer (multiple staff / Civil Service fast streamer / Similar devices) ?[]  - 0 ?Simple Staple / Suture removal (25 or less) ?[]  - 0 ?Complex Staple / Suture removal (26 or more) ?[]  - 0 ?Hypo/Hyperglycemic Management (do not check if billed separately) ?[]  - 0 ?Ankle / Brachial Index (ABI) - do not check if billed separately ?Has the patient been seen at the hospital within the last three years: Yes ?Total Score: 0 ?Level Of  Care: ____ ?BRYLER, DIBBLE ([id number]) ?Electronic Signature(s) ?Signed: 09/10/2021 4:22:54 PM By: Levora Dredge ?Entered By: Levora Dredge on 09/10/2021 08:47:21 ?AARAV, BURGETT ([id number]) ?-------------------------------------------------------------------------------- ?  Compression Therapy Details ?Patient Name: [name], [name] ?Date of Service: 09/10/2021 8:15 AM ?Medical Record Number: [id number] ?Patient Account Number: [id number] ?Date of Birth/Sex: 17-Jul-1980 (41 y.o. M) ?Treating RN: Levora Dredge ?Primary Care Gwyneth Fernandez: [name] Other Clinician: ?Referring Tywana Robotham: [name] ?Treating Sher Shampine/Extender: Jeri Cos ?Weeks in Treatment: 248 ?Compression Therapy Performed for Wound Assessment: Wound #1 Left,Lateral Lower Leg ?Performed By: Clinician Levora Dredge, RN ?Compression Type: Four Layer ?Pre Treatment ABI: 1.2 ?Post Procedure Diagnosis ?Same as Pre-procedure ?Electronic Signature(s) ?Signed: 09/10/2021 4:22:54 PM By: Levora Dredge ?Entered By: Levora Dredge on 09/10/2021 08:34:22 ?KJELL, BRANNEN ([id number]) ?-------------------------------------------------------------------------------- ?Encounter Discharge Information Details ?Patient Name: [name] ?Date of Service: 09/10/2021 8:15 AM ?Medical Record Number: [id number] ?Patient Account Number: [id number] ?Date of Birth/Sex: 09-Sep-1980 (41 y.o. M) ?Treating RN: Levora Dredge ?Primary Care Kordel Leavy: [name] Other Clinician: ?Referring Shavonna Corella: [name] ?Treating Jian Hodgman/Extender: Jeri Cos ?Weeks in Treatment: 248 ?Encounter Discharge Information Items ?Discharge Condition: Stable ?Ambulatory Status: Ambulatory ?Discharge Destination: Home ?Transportation: Private Auto ?Accompanied By: self ?Schedule Follow-up Appointment: Yes ?Clinical Summary of Care: Patient Declined ?Electronic Signature(s) ?Signed: 09/10/2021 4:22:54 PM By: Levora Dredge ?Entered By: Levora Dredge on 09/10/2021 08:48:29 ?DORIEN, MAYOTTE  ([id number]) ?-------------------------------------------------------------------------------- ?Lower Extremity Assessment Details ?Patient Name: HERSON, PRICHARD ?Date of Service: 09/10/2021 8:15 AM ?Medical Record Number: [id number] ?Patient Account Number: [id number] ?Date of Birth/Sex: [birth year] (41 y.o. M) ?Treating RN: Levora Dredge ?Primary Care Ervie Mccard: [name] Other Clinician: ?Referring Shyonna Carlin: [name] ?Treating Fender Herder/Extender: Jeri Cos ?Weeks in Treatment: 248 ?Edema Assessment ?Assessed: [Left: No] [Right: No] ?Edema: [Left: Ye] [Right: s] ?Calf ?Left: Right: ?Point of Measurement: 35 cm From Medial Instep 47 cm ?Ankle ?Left: Right: ?Point of Measurement: 10 cm From Medial Instep 29 cm ?Vascular Assessment ?Pulses: ?Dorsalis Pedis ?Palpable: [Left:Yes] ?Electronic Signature(s) ?Signed: 09/10/2021 4:22:54 PM By: Levora Dredge ?Entered By: Levora Dredge on 09/10/2021 08:23:17 ?ROLDAN, LAFOREST ([id number]) ?-------------------------------------------------------------------------------- ?Multi Wound Chart Details ?Patient Name: STACEY, MAURA ?Date of Service: 09/10/2021 8:15 AM ?Medical Record Number: [id number] ?Patient Account Number: [id number] ?Date of Birth/Sex: 1980/11/13 (41 y.o. M) ?Treating RN: Levora Dredge ?Primary Care Sylvanus Telford: [name] Other Clinician: ?Referring Jensyn Shave: [name] ?Treating Myndi Wamble/Extender: Jeri Cos ?Weeks in Treatment: 248 ?Vital Signs ?Height(in): 71 ?Pulse(bpm): 78 ?Weight(lbs): 338 ?Blood Pressure(mmHg): 124/83 ?Body Mass Index(BMI): 47.1 ?Temperature(??F): 97.7 ?Respiratory Rate(breaths/min): 18 ?Photos: [N/A:N/A] ?Wound Location: Left, Lateral Lower Leg N/A N/A ?Wounding Event: Gradually Appeared N/A N/A ?Primary Etiology: Pyoderma N/A N/A ?Comorbid History: Sleep Apnea, Hypertension, Colitis N/A N/A ?Date Acquired: 11/18/2015 N/A N/A ?Weeks of Treatment: 248 N/A N/A ?Wound Status: Open N/A N/A ?Wound Recurrence: No N/A  N/A ?Measurements L x W x D (cm) 3x2x0.2 N/A N/A ?Area (cm?) : 4.712 N/A N/A ?Volume (cm?) : 0.942 N/A N/A ?% Reduction in Area: 4.00% N/A N/A ?% Reduction in Volume: 76.00% N/A N/A ?Classification: Full Thickness With Expo

## 2021-09-12 DIAGNOSIS — I872 Venous insufficiency (chronic) (peripheral): Secondary | ICD-10-CM | POA: Diagnosis not present

## 2021-09-12 NOTE — Progress Notes (Signed)
[name], [name] ([id number]) ?Visit Report for 09/12/2021 ?Arrival Information Details ?Patient Name: [name], [name] ?Date of Service: 09/12/2021 8:00 AM ?Medical Record Number: [id number] ?Patient Account Number: [id number] ?Date of Birth/Sex: 07-Mar-1981 (41 y.o. M) ?Treating RN: Donnamarie Poag ?Primary Care Lehi Phifer: [name] Other Clinician: ?Referring Sreshta Cressler: [name] ?Treating Denasia Venn/Extender: Kalman Shan ?Weeks in Treatment: 249 ?Visit Information History Since Last Visit ?Added or deleted any medications: No ?Patient Arrived: Ambulatory ?Had a fall or experienced change in No ?Arrival Time: 08:00 ?activities of daily living that may affect ?Accompanied By: self ?risk of falls: ?Transfer Assistance: None ?Hospitalized since last visit: No ?Patient Identification Verified: Yes ?Has Dressing in Place as Prescribed: Yes ?Secondary Verification Process Completed: Yes ?Has Compression in Place as Prescribed: Yes ?Patient Requires Transmission-Based No ?Pain Present Now: No ?Precautions: ?Patient Has Alerts: Yes ?Patient Alerts: Patient has reaction ?to ?silver dressings. ?Electronic Signature(s) ?Signed: 09/12/2021 4:04:00 PM By: Donnamarie Poag ?Entered ByDonnamarie Poag on 09/12/2021 08:13:42 ?NICHALOS, BRENTON ([id number]) ?-------------------------------------------------------------------------------- ?Compression Therapy Details ?Patient Name: [name], [name] ?Date of Service: 09/12/2021 8:00 AM ?Medical Record Number: [id number] ?Patient Account Number: [id number] ?Date of Birth/Sex: Nov 22, 1980 (41 y.o. M) ?Treating RN: Donnamarie Poag ?Primary Care Ethel Meisenheimer: [name] Other Clinician: ?Referring Cray Monnin: [name] ?Treating Caetano Oberhaus/Extender: Kalman Shan ?Weeks in Treatment: 249 ?Compression Therapy Performed for Wound Assessment: Wound #1 Left,Lateral Lower Leg ?Performed By: Clinician Donnamarie Poag, RN ?Compression Type: Four Layer ?Electronic Signature(s) ?Signed: 09/12/2021 4:04:00 PM By: Donnamarie Poag ?Entered ByDonnamarie Poag on 09/12/2021 08:14:23 ?[name], [name] ([id number]) ?-------------------------------------------------------------------------------- ?Encounter Discharge Information Details ?Patient Name: [name], [name] ?Date of Service: 09/12/2021 8:00 AM ?Medical Record Number: [id number] ?Patient Account Number: [id number] ?Date of Birth/Sex: [birth year] (41 y.o. M) ?Treating RN: Donnamarie Poag ?Primary Care Andreia Gandolfi: [name] Other Clinician: ?Referring Seab Axel: [name] ?Treating Yarelli Decelles/Extender: Kalman Shan ?Weeks in Treatment: 249 ?Encounter Discharge Information Items ?Discharge Condition: Stable ?Ambulatory Status: Ambulatory ?Discharge Destination: Home ?Transportation: Private Auto ?Accompanied By: self ?Schedule Follow-up Appointment: Yes ?Clinical Summary of Care: ?Electronic Signature(s) ?Signed: 09/12/2021 4:04:00 PM By: Donnamarie Poag ?Entered ByDonnamarie Poag on 09/12/2021 08:15:45 ?[name], [name] ([id number]) ?-------------------------------------------------------------------------------- ?Wound Assessment Details ?Patient Name: [name], [name] ?Date of Service: 09/12/2021 8:00 AM ?Medical Record Number: [id number] ?Patient Account Number: [id number] ?Date of Birth/Sex: May 14, 1981 (41 y.o. M) ?Treating RN: Donnamarie Poag ?Primary Care Gorje Iyer: [name] Other Clinician: ?Referring Liliyana Thobe: [name] ?Treating Jael Waldorf/Extender: Kalman Shan ?Weeks in Treatment: 249 ?Wound Status ?Wound Number: 1 ?Primary Etiology: Pyoderma ?Wound Location: Left, Lateral Lower Leg ?Wound Status: Open ?Wounding Event: Gradually Appeared ?Comorbid History: Sleep Apnea, Hypertension, Colitis ?Date Acquired: 11/18/2015 ?Weeks Of Treatment: 249 ?Clustered Wound: No ?Wound Measurements ?Length: (cm) 3 ?Width: (cm) 2 ?Depth: (cm) 0.2 ?Area: (cm?) 4.712 ?Volume: (cm?) 0.942 ?% Reduction in Area: 4% ?% Reduction in Volume: 76% ?Epithelialization: Medium (34-66%) ?Wound Description ?Classification:  Full Thickness With Exposed Support Structures ?Wound Margin: Flat and Intact ?Exudate Amount: Medium ?Exudate Type: Serosanguineous ?Exudate Color: red, brown ?Foul Odor After Cleansing: No ?Slough/Fibrino Yes ?Wound Bed ?Granulation Amount: Large (67-100%) Exposed Structure ?Granulation Quality: Red, Pink ?Fascia Exposed: No ?Necrotic Amount: Small (1-33%) ?Fat Layer (Subcutaneous Tissue) Exposed: Yes ?Necrotic Quality: Adherent Slough ?Tendon Exposed: No ?Muscle Exposed: No ?Joint Exposed: No ?Bone Exposed: No ?Treatment Notes ?Wound #1 (Lower Leg) Wound Laterality: Left, Lateral ?Cleanser ?Wound Cleanser ?Discharge Instruction: Wash your hands with soap and water. Remove old dressing, discard into plastic bag and place into trash. ?Cleanse the wound with Wound Cleanser prior to applying a clean dressing using gauze sponges,  not tissues or cotton balls. Do ?not scrub or use excessive force. Pat dry using gauze sponges, not tissue or cotton balls. ?Peri-Wound Care ?Topical ?Clobetasol Propionate ointment 0.05%, 60 (g) tube ?Discharge Instruction: Pt to bring to appt- applied to wound bed ?Primary Dressing ?Hydrofera Blue Ready Transfer Foam, 4x5 (in/in) ?Discharge Instruction: Apply Hydrofera Blue Ready to wound bed as directed ?Secondary Dressing ?(NON-BORDER) Zetuvit Plus Silicone NON-BORDER 5x5 (in/in) ?Secured With ?[name], [name] ([id number]) ?Compression Wrap ?Medichoice 4 layer Compression System, 35-40 mmHG ?Discharge Instruction: Apply multi-layer wrap as directed. ?Compression Stockings ?Add-Ons ?Electronic Signature(s) ?Signed: 09/12/2021 4:04:00 PM By: Donnamarie Poag ?Entered ByDonnamarie Poag on 09/12/2021 08:14:05 ?

## 2021-09-12 NOTE — Progress Notes (Signed)
DIESEL, LINA ([id number]) ?Visit Report for 09/12/2021 ?Physician Orders Details ?Patient Name: [name], [name] ?Date of Service: 09/12/2021 8:00 AM ?Medical Record Number: [id number] ?Patient Account Number: [id number] ?Date of Birth/Sex: 08-23-1980 (41 y.o. M) ?Treating RN: Donnamarie Poag ?Primary Care Provider: [name] Other Clinician: ?Referring Provider: [name] ?Treating Provider/Extender: Kalman Shan ?Weeks in Treatment: 249 ?Verbal / Phone Orders: No ?Diagnosis Coding ?Follow-up Appointments ?o Return Appointment in 1 week. ?o Nurse Visit as needed - twice a week ?Bathing/ Shower/ Hygiene ?o Clean wound with Normal Saline or wound cleanser. ?o May shower with wound dressing protected with water repellent cover or cast protector. ?o No tub bath. ?Edema Control - Lymphedema / Segmental Compressive Device / Other ?o Optional: One layer of unna paste to top of compression wrap (to act as an anchor). ?o Elevate, Exercise Daily and Avoid Standing for Long Periods of Time. ?o Elevate legs to the level of the heart and pump ankles as often as possible ?o Elevate leg(s) parallel to the floor when sitting. ?Wound Treatment ?Wound #1 - Lower Leg Wound Laterality: Left, Lateral ?Cleanser: Wound Cleanser 3 x Per Week/30 Days ?Discharge Instructions: Wash your hands with soap and water. Remove old dressing, discard into plastic bag and place into trash. ?Cleanse the wound with Wound Cleanser prior to applying a clean dressing using gauze sponges, not tissues or cotton balls. Do not ?scrub or use excessive force. Pat dry using gauze sponges, not tissue or cotton balls. ?Topical: Clobetasol Propionate ointment 0.05%, 60 (g) tube 3 x Per Week/30 Days ?Discharge Instructions: Pt to bring to appt- applied to wound bed ?Primary Dressing: Hydrofera Blue Ready Transfer Foam, 4x5 (in/in) 3 x Per Week/30 Days ?Discharge Instructions: Apply Hydrofera Blue Ready to wound bed as directed ?Secondary  Dressing: (NON-BORDER) Zetuvit Plus Silicone NON-BORDER 5x5 (in/in) 3 x Per Week/30 Days ?Compression Wrap: Medichoice 4 layer Compression System, 35-40 mmHG (Generic) 3 x Per Week/30 Days ?Discharge Instructions: Apply multi-layer wrap as directed. ?Electronic Signature(s) ?Signed: 09/12/2021 8:55:50 AM By: Kalman Shan DO ?Signed: 09/12/2021 4:04:00 PM By: Donnamarie Poag ?Entered ByDonnamarie Poag on 09/12/2021 08:15:12 ?[name], [name] ([id number]) ?-------------------------------------------------------------------------------- ?SuperBill Details ?Patient Name: [name], [name] ?Date of Service: 09/12/2021 ?Medical Record Number: [id number] ?Patient Account Number: [id number] ?Date of Birth/Sex: Sep 04, 1980 (41 y.o. 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

## 2021-09-14 DIAGNOSIS — I872 Venous insufficiency (chronic) (peripheral): Secondary | ICD-10-CM | POA: Diagnosis not present

## 2021-09-14 NOTE — Progress Notes (Signed)
PEREGRINE, NOLT ([id number]) ?Visit Report for 09/14/2021 ?Arrival Information Details ?Patient Name: [name], [name] ?Date of Service: 09/14/2021 8:00 AM ?Medical Record Number: [id number] ?Patient Account Number: [id number] ?Date of Birth/Sex: [birth year] (41 y.o. M) ?Treating RN: Donnamarie Poag ?Primary Care Abir Craine: [name] Other Clinician: ?Referring Sinai Illingworth: [name] ?Treating Ellen Mayol/Extender: Jeri Cos ?Weeks in Treatment: 249 ?Visit Information History Since Last Visit ?Added or deleted any medications: No ?Patient Arrived: Ambulatory ?Had a fall or experienced change in No ?Arrival Time: 08:01 ?activities of daily living that may affect ?Accompanied By: self ?risk of falls: ?Transfer Assistance: None ?Hospitalized since last visit: No ?Patient Identification Verified: Yes ?Has Dressing in Place as Prescribed: Yes ?Secondary Verification Process Completed: Yes ?Has Compression in Place as Prescribed: Yes ?Patient Requires Transmission-Based No ?Pain Present Now: No ?Precautions: ?Patient Has Alerts: Yes ?Patient Alerts: Patient has reaction ?to ?silver dressings. ?Electronic Signature(s) ?Signed: 09/14/2021 4:09:30 PM By: Donnamarie Poag ?Entered ByDonnamarie Poag on 09/14/2021 08:17:27 ?[name], [name] ([id number]) ?-------------------------------------------------------------------------------- ?Compression Therapy Details ?Patient Name: [name], [name] ?Date of Service: 09/14/2021 8:00 AM ?Medical Record Number: [id number] ?Patient Account Number: [id number] ?Date of Birth/Sex: 28-May-1980 (41 y.o. M) ?Treating RN: Donnamarie Poag ?Primary Care Avir Deruiter: [name] Other Clinician: ?Referring Calahan Pak: [name] ?Treating Shandel Busic/Extender: Jeri Cos ?Weeks in Treatment: 249 ?Compression Therapy Performed for Wound Assessment: Wound #1 Left,Lateral Lower Leg ?Performed By: Clinician Donnamarie Poag, RN ?Compression Type: Four Layer ?Electronic Signature(s) ?Signed: 09/14/2021 4:09:30 PM By: Donnamarie Poag ?Entered ByDonnamarie Poag on 09/14/2021 08:18:00 ?[name], [name] ([id number]) ?-------------------------------------------------------------------------------- ?Encounter Discharge Information Details ?Patient Name: [name], [name] ?Date of Service: 09/14/2021 8:00 AM ?Medical Record Number: [id number] ?Patient Account Number: [id number] ?Date of Birth/Sex: 1980/08/16 (41 y.o. M) ?Treating RN: Donnamarie Poag ?Primary Care Justin Meisenheimer: [name] Other Clinician: ?Referring Jacori Mulrooney: [name] ?Treating Josiel Gahm/Extender: Jeri Cos ?Weeks in Treatment: 249 ?Encounter Discharge Information Items ?Discharge Condition: Stable ?Ambulatory Status: Ambulatory ?Discharge Destination: Home ?Transportation: Private Auto ?Accompanied By: self ?Schedule Follow-up Appointment: Yes ?Clinical Summary of Care: ?Electronic Signature(s) ?Signed: 09/14/2021 4:09:30 PM By: Donnamarie Poag ?Entered ByDonnamarie Poag on 09/14/2021 08:19:55 ?[name], [name] ([id number]) ?-------------------------------------------------------------------------------- ?Wound Assessment Details ?Patient Name: [name], [name] ?Date of Service: 09/14/2021 8:00 AM ?Medical Record Number: [id number] ?Patient Account Number: [id number] ?Date of Birth/Sex: 01-30-1981 (41 y.o. M) ?Treating RN: Donnamarie Poag ?Primary Care Dejour Vos: [name] Other Clinician: ?Referring Reagan Behlke: [name] ?Treating Sumayah Bearse/Extender: Jeri Cos ?Weeks in Treatment: 249 ?Wound Status ?Wound Number: 1 ?Primary Etiology: Pyoderma ?Wound Location: Left, Lateral Lower Leg ?Wound Status: Open ?Wounding Event: Gradually Appeared ?Comorbid History: Sleep Apnea, Hypertension, Colitis ?Date Acquired: 11/18/2015 ?Weeks Of Treatment: 249 ?Clustered Wound: No ?Wound Measurements ?Length: (cm) 3 ?Width: (cm) 2 ?Depth: (cm) 0.2 ?Area: (cm?) 4.712 ?Volume: (cm?) 0.942 ?% Reduction in Area: 4% ?% Reduction in Volume: 76% ?Epithelialization: Medium (34-66%) ?Wound Description ?Classification: Full  Thickness With Exposed Support Structures ?Wound Margin: Flat and Intact ?Exudate Amount: Medium ?Exudate Type: Serosanguineous ?Exudate Color: red, brown ?Foul Odor After Cleansing: No ?Slough/Fibrino Yes ?Wound Bed ?Granulation Amount: Large (67-100%) Exposed Structure ?Granulation Quality: Red, Pink ?Fascia Exposed: No ?Necrotic Amount: Small (1-33%) ?Fat Layer (Subcutaneous Tissue) Exposed: Yes ?Necrotic Quality: Adherent Slough ?Tendon Exposed: No ?Muscle Exposed: No ?Joint Exposed: No ?Bone Exposed: No ?Treatment Notes ?Wound #1 (Lower Leg) Wound Laterality: Left, Lateral ?Cleanser ?Wound Cleanser ?Discharge Instruction: Wash your hands with soap and water. Remove old dressing, discard into plastic bag and place into trash. ?Cleanse the wound with Wound Cleanser prior to applying a clean dressing using gauze sponges,  not tissues or cotton balls. Do ?not scrub or use excessive force. Pat dry using gauze sponges, not tissue or cotton balls. ?Peri-Wound Care ?Topical ?Clobetasol Propionate ointment 0.05%, 60 (g) tube ?Discharge Instruction: Pt to bring to appt- applied to wound bed ?Primary Dressing ?Hydrofera Blue Ready Transfer Foam, 4x5 (in/in) ?Discharge Instruction: Apply Hydrofera Blue Ready to wound bed as directed ?Secondary Dressing ?(NON-BORDER) Zetuvit Plus Silicone NON-BORDER 5x5 (in/in) ?Secured With ?[name], [name] ([id number]) ?Compression Wrap ?Medichoice 4 layer Compression System, 35-40 mmHG ?Discharge Instruction: Apply multi-layer wrap as directed. ?Compression Stockings ?Add-Ons ?Electronic Signature(s) ?Signed: 09/14/2021 4:09:30 PM By: Donnamarie Poag ?Entered ByDonnamarie Poag on 09/14/2021 08:17:42 ?

## 2021-09-14 NOTE — Progress Notes (Signed)
[name], [name] ([id number]) ?Visit Report for 09/14/2021 ?Physician Orders Details ?Patient Name: [name], [name] ?Date of Service: 09/14/2021 8:00 AM ?Medical Record Number: [id number] ?Patient Account Number: [id number] ?Date of Birth/Sex: 1981/04/05 (41 y.o. M) ?Treating RN: Donnamarie Poag ?Primary Care Provider: [name] Other Clinician: ?Referring Provider: [name] ?Treating Provider/Extender: Jeri Cos ?Weeks in Treatment: 249 ?Verbal / Phone Orders: No ?Diagnosis Coding ?Follow-up Appointments ?o Return Appointment in 1 week. ?o Nurse Visit as needed - twice a week ?Bathing/ Shower/ Hygiene ?o Clean wound with Normal Saline or wound cleanser. ?o May shower with wound dressing protected with water repellent cover or cast protector. ?o No tub bath. ?Edema Control - Lymphedema / Segmental Compressive Device / Other ?o Optional: One layer of unna paste to top of compression wrap (to act as an anchor). ?o Elevate, Exercise Daily and Avoid Standing for Long Periods of Time. ?o Elevate legs to the level of the heart and pump ankles as often as possible ?o Elevate leg(s) parallel to the floor when sitting. ?Medications-Please add to medication list. ?o P.O. Antibiotics - continue as prescribed ?Wound Treatment ?Wound #1 - Lower Leg Wound Laterality: Left, Lateral ?Cleanser: Wound Cleanser 3 x Per Week/30 Days ?Discharge Instructions: Wash your hands with soap and water. Remove old dressing, discard into plastic bag and place into trash. ?Cleanse the wound with Wound Cleanser prior to applying a clean dressing using gauze sponges, not tissues or cotton balls. Do not ?scrub or use excessive force. Pat dry using gauze sponges, not tissue or cotton balls. ?Topical: Clobetasol Propionate ointment 0.05%, 60 (g) tube 3 x Per Week/30 Days ?Discharge Instructions: Pt to bring to appt- applied to wound bed ?Primary Dressing: Hydrofera Blue Ready Transfer Foam, 4x5 (in/in) 3 x Per Week/30  Days ?Discharge Instructions: Apply Hydrofera Blue Ready to wound bed as directed ?Secondary Dressing: (NON-BORDER) Zetuvit Plus Silicone NON-BORDER 5x5 (in/in) 3 x Per Week/30 Days ?Compression Wrap: Medichoice 4 layer Compression System, 35-40 mmHG (Generic) 3 x Per Week/30 Days ?Discharge Instructions: Apply multi-layer wrap as directed. ?Electronic Signature(s) ?Signed: 09/14/2021 4:06:41 PM By: Worthy Keeler PA-C ?Signed: 09/14/2021 4:09:30 PM By: Donnamarie Poag ?Entered ByDonnamarie Poag on 09/14/2021 08:19:32 ?[name], [name] ([id number]) ?-------------------------------------------------------------------------------- ?SuperBill Details ?Patient Name: [name], [name] ?Date of Service: 09/14/2021 ?Medical Record Number: [id number] ?Patient Account Number: [id number] ?Date of Birth/Sex: 07-06-1980 (41 y.o. 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

## 2021-09-15 ENCOUNTER — Other Ambulatory Visit: Payer: Self-pay

## 2021-09-15 DIAGNOSIS — F3342 Major depressive disorder, recurrent, in full remission: Secondary | ICD-10-CM

## 2021-09-15 DIAGNOSIS — E785 Hyperlipidemia, unspecified: Secondary | ICD-10-CM

## 2021-09-15 MED ORDER — ATORVASTATIN CALCIUM 40 MG PO TABS: 40.0000 mg | ORAL_TABLET | Freq: Every day | ORAL | 0 refills | Status: DC

## 2021-09-15 MED ORDER — VENLAFAXINE HCL ER 150 MG PO CP24: 150.0000 mg | ORAL_CAPSULE | Freq: Every day | ORAL | 0 refills | Status: DC

## 2021-09-17 ENCOUNTER — Encounter: Payer: 59 | Attending: Physician Assistant | Admitting: Physician Assistant

## 2021-09-17 DIAGNOSIS — I872 Venous insufficiency (chronic) (peripheral): Secondary | ICD-10-CM | POA: Insufficient documentation

## 2021-09-17 DIAGNOSIS — L97222 Non-pressure chronic ulcer of left calf with fat layer exposed: Secondary | ICD-10-CM | POA: Insufficient documentation

## 2021-09-17 DIAGNOSIS — F17208 Nicotine dependence, unspecified, with other nicotine-induced disorders: Secondary | ICD-10-CM | POA: Diagnosis not present

## 2021-09-17 DIAGNOSIS — L88 Pyoderma gangrenosum: Secondary | ICD-10-CM | POA: Diagnosis not present

## 2021-09-17 DIAGNOSIS — E11622 Type 2 diabetes mellitus with other skin ulcer: Secondary | ICD-10-CM | POA: Diagnosis not present

## 2021-09-17 NOTE — Progress Notes (Addendum)
GUSTABO, GORDILLO ([id number]) ?Visit Report for 09/17/2021 ?Chief Complaint Document Details ?Patient Name: PARMVIR, BOOMER ?Date of Service: 09/17/2021 8:15 AM ?Medical Record Number: [id number] ?Patient Account Number: [id number] ?Date of Birth/Sex: [birth year] (41 y.o. M) ?Treating RN: Levora Dredge ?Primary Care Provider: [name] Other Clinician: ?Referring Provider: [name] ?Treating Provider/Extender: Jeri Cos ?Weeks in Treatment: 249 ?Information Obtained from: Patient ?Chief Complaint ?He is here in follow up evaluation for LLE pyoderma ulcer ?Electronic Signature(s) ?Signed: 09/17/2021 8:17:52 AM By: Worthy Keeler PA-C ?Entered By: Worthy Keeler on 09/17/2021 08:17:52 ?DORRIEN, GRUNDER ([id number]) ?-------------------------------------------------------------------------------- ?HPI Details ?Patient Name: ANDREJ, SPAGNOLI ?Date of Service: 09/17/2021 8:15 AM ?Medical Record Number: [id number] ?Patient Account Number: [id number] ?Date of Birth/Sex: [birth year] (41 y.o. M) ?Treating RN: Levora Dredge ?Primary Care Provider: [name] Other Clinician: ?Referring Provider: [name] ?Treating Provider/Extender: Jeri Cos ?Weeks in Treatment: 249 ?History of Present Illness ?HPI Description: 12/04/16; 41 year old man who comes into the clinic today for review of a wound on the posterior left calf. He tells me that is ?been there for about a year. He is not a diabetic he does smoke half a pack per day. He was seen in the ER on 11/20/16 felt to have cellulitis around ?the wound and was given clindamycin. An x-ray did not show osteomyelitis. The patient initially tells me that he has a milk allergy that sets off a ?pruritic itching rash on his lower legs which she scratches incessantly and he thinks that's what may have set up the wound. He has been using ?various topical antibiotics and ointments without any effect. He works in a [employer]ing [employer] and is on his feet all day. He does not have a  prior ?history of wounds however he does have the rash on both lower legs the right arm and the ventral aspect of his left arm. These are excoriations ?and clearly have had scratching however there are of macular looking areas on both legs including a substantial larger area on the right leg. This ?does not have an underlying open area. There is no blistering. 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 He seemed to recognize this. On further questioning he admits ?to a 5 year history of quiesced. ulcerative colitis. He is not in any treatment for this. He's had no recent travel ?12/11/16; the patient arrives today with his wound and roughly the same condition we've been using silver alginate this is a deep punched out ?wound with some surrounding erythema but no tenderness. 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 I'm really not able to understand this ?We have also received information from the patient's dermatologist in [place] notes from April 2016. This was a doctor Agarwal-antal. 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 He was prescribed topical steroid high potency under occlusion which helped but at this point the ?patient did not have a deep punched out wound. ?12/18/16; the patient's wound is larger in terms of surface area however this surface looks better and there is less depth. The surrounding erythema ?also is better. The patient states that the wrap we put on came off 2 days ago when he has been using his compression stockings.  He we are in ?the process of getting a dermatology consult. ?12/26/16 on evaluation today patient's left lower extremity wound shows evidence of  infection with surrounding erythema noted. He has been ?tolerating the dressing changes but states that he has noted more discomfort. There is a larger area of erythema surrounding the wound. No ?fevers, chills, nausea, or vomiting noted at this time. With that being said the wound still does have slough covering the surface. He is not allergic ?to any medication that he is aware of at this point. In regard to his right lower extremity he had several regions that are erythematous and ?pruritic he wonders if there's anything we can do to help that. ?01/02/17 I reviewed patient's wound culture which was obtained his visit last week. He was placed on doxycycline at that point. Unfortunately that ?does not appear to be an antibiotic that would likely help with the situation however the pseudomonas noted on culture is sensitive to Cipro. Also ?unfortunately patient's wound seems to have a large compared to last week's evaluation. Not severely so but there are definitely increased ?measurements in general. He is continuing to have discomfort as well he writes this to be a seven out of 10. In fact he would prefer me not to ?perform any debridement today due to the fact that he is having discomfort and considering he has an active infection on the little reluctant to do ?so anyway. No fevers, chills, nausea, or vomiting noted at this time. ?01/08/17; patient seems dermatology on September 5. I suspect dermatology will want the slides from the biopsy I did sent to their pathologist. ?I'm not sure if there is a way we can expedite that. In any case the culture I did before I left on vacation 3 weeks ago showed Pseudomonas he ?was given 10 days of Cipro and per her description of her intake nurses is actually somewhat better this week although the wound is quite a bit ?bigger than I remember the last time I saw this. He still has 3 more days of Cipro ?01/21/17; dermatology appointment tomorrow. He has completed the ciprofloxacin  for Pseudomonas. Surface of the wound looks better however he ?is had some deterioration in the lesions on his right leg. Meantime the left lateral leg wound we will continue with sample ?01/29/17; patient had his dermatology appointment but I can't yet see that note. He is completed his antibiotics. The wound is more superficial but ?considerably larger in circumferential area than when he came in. This is in his left lateral calf. He also has swollen erythematous areas with ?superficial wounds on the right leg and small papular areas on both arms. There apparently areas in her his upper thighs and buttocks I did not ?look at those. Dermatology biopsied the right leg. Hopefully will have their input next week. ?02/05/17; patient went back to see his dermatologist who told him that he had a "scratching problem" as well as staph. He is now on a 30 day ?course of doxycycline and I believe she gave him triamcinolone cream to the right leg areas to help with the itching [not exactly sure but probably ?triamcinolone]. She apparently looked at the left lateral leg wound although this was not rebiopsied and I think felt to be ultimately part of the ?same pathogenesis. He is using sample border foam and changing nevus himself. He now has a new open area on the right posterior leg which ?was his biopsy site ?I don't have any of the dermatology notes ?  02/12/17; we put the patient in compression last week with SANTYL to the wound on the left leg and the biopsy. Edema is much better and the ?depth of the wound is now at level of skin. Area is still the same ?oBiopsy site on the right lateral leg we've also been using santyl with a border foam dressing and he is changing this himself. ?02/19/17; Using silver alginate started last week to both the substantial left leg wound and the biopsy site on the right wound. He is tolerating ?compression well. Has a an appointment with his primary M.D. tomorrow wondering about diuretics although  I'm wondering if the edema problem ?is actually lymphedema ?02/26/17; the patient has been to see his primary doctor Dr. Jerrel Ivory at [facility] our primary care. She started him on Lasix 20 mg and this ?seems to

## 2021-09-17 NOTE — Progress Notes (Signed)
ELYAN, VANWIEREN ([id number]) ?Visit Report for 09/17/2021 ?Arrival Information Details ?Patient Name: [name], [name] ?Date of Service: 09/17/2021 8:15 AM ?Medical Record Number: [id number] ?Patient Account Number: [id number] ?Date of Birth/Sex: Jan 30, 1981 (41 y.o. M) ?Treating RN: Levora Dredge ?Primary Care Jaquon Gingerich: [name] Other Clinician: ?Referring Genora Arp: [name] ?Treating Shihab States/Extender: Jeri Cos ?Weeks in Treatment: 249 ?Visit Information History Since Last Visit ?Added or deleted any medications: No ?Patient Arrived: Ambulatory ?Any new allergies or adverse reactions: No ?Arrival Time: 08:06 ?Had a fall or experienced change in No ?Accompanied By: self ?activities of daily living that may affect ?Transfer Assistance: None ?risk of falls: ?Patient Requires Transmission-Based No ?Hospitalized since last visit: No ?Precautions: ?Has Dressing in Place as Prescribed: Yes ?Patient Has Alerts: Yes ?Has Compression in Place as Prescribed: Yes ?Patient Alerts: Patient has reaction ?Pain Present Now: No ?to ?silver dressings. ?Electronic Signature(s) ?Signed: 09/17/2021 4:42:16 PM By: Levora Dredge ?Entered By: Levora Dredge on 09/17/2021 08:07:05 ?[name], [name] ([id number]) ?-------------------------------------------------------------------------------- ?Clinic Level of Care Assessment Details ?Patient Name: [name], [name] ?Date of Service: 09/17/2021 8:15 AM ?Medical Record Number: [id number] ?Patient Account Number: [id number] ?Date of Birth/Sex: 1980-06-23 (41 y.o. M) ?Treating RN: Levora Dredge ?Primary Care Adya Wirz: [name] Other Clinician: ?Referring Camron Monday: [name] ?Treating Elizer Bostic/Extender: Jeri Cos ?Weeks in Treatment: 249 ?Clinic Level of Care Assessment Items ?TOOL 1 Quantity Score ?[]  - Use when EandM and Procedure is performed on INITIAL visit 0 ?ASSESSMENTS - Nursing Assessment / Reassessment ?[]  - General Physical Exam (combine w/ comprehensive assessment (listed  just below) when performed on new ?0 ?pt. evals) ?[]  - 0 ?Comprehensive Assessment (HX, ROS, Risk Assessments, Wounds Hx, etc.) ?ASSESSMENTS - Wound and Skin Assessment / Reassessment ?[]  - Dermatologic / Skin Assessment (not related to wound area) 0 ?ASSESSMENTS - Ostomy and/or Continence Assessment and Care ?[]  - Incontinence Assessment and Management 0 ?[]  - 0 ?Ostomy Care Assessment and Management (repouching, etc.) ?PROCESS - Coordination of Care ?[]  - Simple Patient / Family Education for ongoing care 0 ?[]  - 0 ?Complex (extensive) Patient / Family Education for ongoing care ?[]  - 0 ?Staff obtains Consents, Records, Test Results / Process Orders ?[]  - 0 ?Staff telephones HHA, Nursing Homes / Clarify orders / etc ?[]  - 0 ?Routine Transfer to another Facility (non-emergent condition) ?[]  - 0 ?Routine Hospital Admission (non-emergent condition) ?[]  - 0 ?New Admissions / Biomedical engineer / Ordering NPWT, Apligraf, etc. ?[]  - 0 ?Emergency Hospital Admission (emergent condition) ?PROCESS - Special Needs ?[]  - Pediatric / Minor Patient Management 0 ?[]  - 0 ?Isolation Patient Management ?[]  - 0 ?Hearing / Language / Visual special needs ?[]  - 0 ?Assessment of Community assistance (transportation, D/C planning, etc.) ?[]  - 0 ?Additional assistance / Altered mentation ?[]  - 0 ?Support Surface(s) Assessment (bed, cushion, seat, etc.) ?INTERVENTIONS - Miscellaneous ?[]  - External ear exam 0 ?[]  - 0 ?Patient Transfer (multiple staff / Civil Service fast streamer / Similar devices) ?[]  - 0 ?Simple Staple / Suture removal (25 or less) ?[]  - 0 ?Complex Staple / Suture removal (26 or more) ?[]  - 0 ?Hypo/Hyperglycemic Management (do not check if billed separately) ?[]  - 0 ?Ankle / Brachial Index (ABI) - do not check if billed separately ?Has the patient been seen at the hospital within the last three years: Yes ?Total Score: 0 ?Level Of Care: ____ ?[name], [name] ([id number]) ?Electronic Signature(s) ?Signed: 09/17/2021 4:42:16 PM By:  Levora Dredge ?Entered By: Levora Dredge on 09/17/2021 08:41:35 ?[name], [name] ([id number]) ?-------------------------------------------------------------------------------- ?Compression Therapy Details ?Patient Name: [name], [name] ?Date of  Service: 09/17/2021 8:15 AM ?Medical Record Number: [id number] ?Patient Account Number: [id number] ?Date of Birth/Sex: July 07, 1980 (41 y.o. M) ?Treating RN: Levora Dredge ?Primary Care Kaaliyah Kita: [name] Other Clinician: ?Referring Megan Hayduk: [name] ?Treating Kadijah Shamoon/Extender: Jeri Cos ?Weeks in Treatment: 249 ?Compression Therapy Performed for Wound Assessment: Wound #1 Left,Lateral Lower Leg ?Performed By: Clinician Levora Dredge, RN ?Compression Type: Four Layer ?Pre Treatment ABI: 1.2 ?Post Procedure Diagnosis ?Same as Pre-procedure ?Electronic Signature(s) ?Signed: 09/17/2021 4:42:16 PM By: Levora Dredge ?Entered By: Levora Dredge on 09/17/2021 08:41:14 ?[name], BOSSERMAN ([id number]) ?-------------------------------------------------------------------------------- ?Encounter Discharge Information Details ?Patient Name: [name], [name] ?Date of Service: 09/17/2021 8:15 AM ?Medical Record Number: [id number] ?Patient Account Number: [id number] ?Date of Birth/Sex: [birth year] (41 y.o. M) ?Treating RN: Levora Dredge ?Primary Care Mahogany Torrance: [name] Other Clinician: ?Referring Marelyn Rouser: [name] ?Treating Javyon Fontan/Extender: Jeri Cos ?Weeks in Treatment: 249 ?Encounter Discharge Information Items ?Discharge Condition: Stable ?Ambulatory Status: Ambulatory ?Discharge Destination: Home ?Transportation: Private Auto ?Accompanied By: self ?Schedule Follow-up Appointment: Yes ?Clinical Summary of Care: Patient Declined ?Electronic Signature(s) ?Signed: 09/17/2021 4:42:16 PM By: Levora Dredge ?Entered By: Levora Dredge on 09/17/2021 08:42:31 ?[name], [name] ([id number]) ?-------------------------------------------------------------------------------- ?Lower  Extremity Assessment Details ?Patient Name: [name], [name] ?Date of Service: 09/17/2021 8:15 AM ?Medical Record Number: [id number] ?Patient Account Number: [id number] ?Date of Birth/Sex: December 04, 1980 (41 y.o. M) ?Treating RN: Levora Dredge ?Primary Care Khaleb Broz: [name] Other Clinician: ?Referring Delrick Dehart: [name] ?Treating Jakyren Fluegge/Extender: Jeri Cos ?Weeks in Treatment: 249 ?Edema Assessment ?Assessed: [Left: No] [Right: No] ?Edema: [Left: Ye] [Right: s] ?Calf ?Left: Right: ?Point of Measurement: 35 cm From Medial Instep 47 cm ?Ankle ?Left: Right: ?Point of Measurement: 10 cm From Medial Instep 29.5 cm ?Vascular Assessment ?Pulses: ?Dorsalis Pedis ?Palpable: [Left:Yes] ?Electronic Signature(s) ?Signed: 09/17/2021 4:42:16 PM By: Levora Dredge ?Entered By: Levora Dredge on 09/17/2021 08:21:47 ?[name], [name] ([id number]) ?-------------------------------------------------------------------------------- ?Multi Wound Chart Details ?Patient Name: [name], [name] ?Date of Service: 09/17/2021 8:15 AM ?Medical Record Number: [id number] ?Patient Account Number: [id number] ?Date of Birth/Sex: [birth year] (41 y.o. M) ?Treating RN: Levora Dredge ?Primary Care Latanya Hemmer: [name] Other Clinician: ?Referring Nurah Petrides: [name] ?Treating Don Giarrusso/Extender: Jeri Cos ?Weeks in Treatment: 249 ?Vital Signs ?Height(in): 71 ?Pulse(bpm): 84 ?Weight(lbs): 338 ?Blood Pressure(mmHg): 123/82 ?Body Mass Index(BMI): 47.1 ?Temperature(??F): 98.36 ?Respiratory Rate(breaths/min): 18 ?Photos: [N/A:N/A] ?Wound Location: Left, Lateral Lower Leg N/A N/A ?Wounding Event: Gradually Appeared N/A N/A ?Primary Etiology: Pyoderma N/A N/A ?Comorbid History: Sleep Apnea, Hypertension, Colitis N/A N/A ?Date Acquired: 11/18/2015 N/A N/A ?Weeks of Treatment: 249 N/A N/A ?Wound Status: Open N/A N/A ?Wound Recurrence: No N/A N/A ?Measurements L x W x D (cm) 3x2x0.2 N/A N/A ?Area (cm?) : 4.712 N/A N/A ?Volume (cm?) : 0.942 N/A N/A ?%  Reduction in Area: 4.00% N/A N/A ?% Reduction in Volume: 76.00% N/A N/A ?Classification: Full Thickness With Exposed N/A N/A ?Support Structures ?Exudate Amount: Medium N/A N/A ?Exudate Type: Serosanguineous

## 2021-09-19 DIAGNOSIS — I872 Venous insufficiency (chronic) (peripheral): Secondary | ICD-10-CM | POA: Diagnosis not present

## 2021-09-19 NOTE — Progress Notes (Signed)
[name], [name] ([id number]) ?Visit Report for 09/19/2021 ?Arrival Information Details ?Patient Name: [name], [name] ?Date of Service: 09/19/2021 8:00 AM ?Medical Record Number: [id number] ?Patient Account Number: [id number] ?Date of Birth/Sex: 1981/03/02 (41 y.o. M) ?Treating RN: Donnamarie Poag ?Primary Care Jamar Weatherall: [name] Other Clinician: ?Referring Mycheal Veldhuizen: [name] ?Treating Rickeya Manus/Extender: Kalman Shan ?Weeks in Treatment: 250 ?Visit Information History Since Last Visit ?Added or deleted any medications: No ?Patient Arrived: Ambulatory ?Had a fall or experienced change in No ?Arrival Time: 08:01 ?activities of daily living that may affect ?Accompanied By: self ?risk of falls: ?Transfer Assistance: None ?Hospitalized since last visit: No ?Patient Identification Verified: Yes ?Has Dressing in Place as Prescribed: Yes ?Secondary Verification Process Completed: Yes ?Has Compression in Place as Prescribed: Yes ?Patient Requires Transmission-Based No ?Pain Present Now: No ?Precautions: ?Patient Has Alerts: Yes ?Patient Alerts: Patient has reaction ?to ?silver dressings. ?Electronic Signature(s) ?Signed: 09/19/2021 2:19:31 PM By: Donnamarie Poag ?Entered ByDonnamarie Poag on 09/19/2021 08:21:46 ?QUANTEZ, SCHNYDER ([id number]) ?-------------------------------------------------------------------------------- ?Compression Therapy Details ?Patient Name: [name], [name] ?Date of Service: 09/19/2021 8:00 AM ?Medical Record Number: [id number] ?Patient Account Number: [id number] ?Date of Birth/Sex: [birth year] (41 y.o. M) ?Treating RN: Donnamarie Poag ?Primary Care Ladonya Jerkins: [name] Other Clinician: ?Referring Howard Patton: [name] ?Treating Doaa Kendzierski/Extender: Kalman Shan ?Weeks in Treatment: 250 ?Compression Therapy Performed for Wound Assessment: Wound #1 Left,Lateral Lower Leg ?Performed By: Clinician Donnamarie Poag, RN ?Compression Type: Four Layer ?Electronic Signature(s) ?Signed: 09/19/2021 2:19:31 PM By: Donnamarie Poag ?Entered ByDonnamarie Poag on 09/19/2021 08:23:49 ?BREVON, DEWALD ([id number]) ?-------------------------------------------------------------------------------- ?Encounter Discharge Information Details ?Patient Name: [name], [name] ?Date of Service: 09/19/2021 8:00 AM ?Medical Record Number: [id number] ?Patient Account Number: [id number] ?Date of Birth/Sex: 10/14/1980 (41 y.o. M) ?Treating RN: Donnamarie Poag ?Primary Care Shawndrea Rutkowski: [name] Other Clinician: ?Referring Zarayah Lanting: [name] ?Treating Yvonne Stopher/Extender: Kalman Shan ?Weeks in Treatment: 250 ?Encounter Discharge Information Items ?Discharge Condition: Stable ?Ambulatory Status: Ambulatory ?Discharge Destination: Home ?Transportation: Private Auto ?Accompanied By: self ?Schedule Follow-up Appointment: Yes ?Clinical Summary of Care: ?Electronic Signature(s) ?Signed: 09/19/2021 2:19:31 PM By: Donnamarie Poag ?Entered ByDonnamarie Poag on 09/19/2021 08:24:50 ?[name], [name] ([id number]) ?-------------------------------------------------------------------------------- ?Wound Assessment Details ?Patient Name: [name], [name] ?Date of Service: 09/19/2021 8:00 AM ?Medical Record Number: [id number] ?Patient Account Number: [id number] ?Date of Birth/Sex: March 25, 1981 (41 y.o. M) ?Treating RN: Donnamarie Poag ?Primary Care Pierina Schuknecht: [name] Other Clinician: ?Referring Tamasha Laplante: [name] ?Treating Mikell Kazlauskas/Extender: Kalman Shan ?Weeks in Treatment: 250 ?Wound Status ?Wound Number: 1 ?Primary Etiology: Pyoderma ?Wound Location: Left, Lateral Lower Leg ?Wound Status: Open ?Wounding Event: Gradually Appeared ?Comorbid History: Sleep Apnea, Hypertension, Colitis ?Date Acquired: 11/18/2015 ?Weeks Of Treatment: 250 ?Clustered Wound: No ?Wound Measurements ?Length: (cm) 3 ?Width: (cm) 2 ?Depth: (cm) 0.2 ?Area: (cm?) 4.712 ?Volume: (cm?) 0.942 ?% Reduction in Area: 4% ?% Reduction in Volume: 76% ?Epithelialization: Medium (34-66%) ?Wound Description ?Classification:  Full Thickness With Exposed Support Structures ?Wound Margin: Flat and Intact ?Exudate Amount: Medium ?Exudate Type: Serosanguineous ?Exudate Color: red, brown ?Foul Odor After Cleansing: No ?Slough/Fibrino Yes ?Wound Bed ?Granulation Amount: Large (67-100%) Exposed Structure ?Granulation Quality: Red, Pink ?Fascia Exposed: No ?Necrotic Amount: Small (1-33%) ?Fat Layer (Subcutaneous Tissue) Exposed: Yes ?Necrotic Quality: Adherent Slough ?Tendon Exposed: No ?Muscle Exposed: No ?Joint Exposed: No ?Bone Exposed: No ?Treatment Notes ?Wound #1 (Lower Leg) Wound Laterality: Left, Lateral ?Cleanser ?Wound Cleanser ?Discharge Instruction: Wash your hands with soap and water. Remove old dressing, discard into plastic bag and place into trash. ?Cleanse the wound with Wound Cleanser prior to applying a clean dressing using gauze sponges,  not tissues or cotton balls. Do ?not scrub or use excessive force. Pat dry using gauze sponges, not tissue or cotton balls. ?Peri-Wound Care ?Desitin Maximum Strength Ointment 4 (oz) ?Discharge Instruction: Apply around wound edges to avoid maceration ?Topical ?Clobetasol Propionate ointment 0.05%, 60 (g) tube ?Discharge Instruction: Pt to bring to appt- applied to wound bed ?Primary Dressing ?Hydrofera Blue Ready Transfer Foam, 4x5 (in/in) ?Discharge Instruction: Apply Hydrofera Blue Ready to wound bed as directed ?Secondary Dressing ?(NON-BORDER) Zetuvit Plus Silicone NON-BORDER 5x5 (in/in) ?CRU, KRITIKOS ([id number]) ?Secured With ?Compression Wrap ?Medichoice 4 layer Compression System, 35-40 mmHG ?Discharge Instruction: Apply multi-layer wrap as directed. ?Compression Stockings ?Add-Ons ?Electronic Signature(s) ?Signed: 09/19/2021 2:19:31 PM By: Donnamarie Poag ?Entered ByDonnamarie Poag on 09/19/2021 08:22:25 ?

## 2021-09-19 NOTE — Progress Notes (Signed)
CAESON, FILIPPI ([id number]) ?Visit Report for 09/19/2021 ?Physician Orders Details ?Patient Name: [name], [name] ?Date of Service: 09/19/2021 8:00 AM ?Medical Record Number: [id number] ?Patient Account Number: [id number] ?Date of Birth/Sex: 15-Jan-1981 (41 y.o. M) ?Treating RN: Donnamarie Poag ?Primary Care Provider: [name] Other Clinician: ?Referring Provider: [name] ?Treating Provider/Extender: Kalman Shan ?Weeks in Treatment: 250 ?Verbal / Phone Orders: No ?Diagnosis Coding ?Follow-up Appointments ?o Return Appointment in 1 week. ?o Nurse Visit as needed - twice a week ?Bathing/ Shower/ Hygiene ?o Clean wound with Normal Saline or wound cleanser. ?o May shower with wound dressing protected with water repellent cover or cast protector. ?o No tub bath. ?Edema Control - Lymphedema / Segmental Compressive Device / Other ?o Optional: One layer of unna paste to top of compression wrap (to act as an anchor). - He needs this to hold it up ?o Elevate, Exercise Daily and Avoid Standing for Long Periods of Time. ?o Elevate legs to the level of the heart and pump ankles as often as possible ?o Elevate leg(s) parallel to the floor when sitting. ?Wound Treatment ?Wound #1 - Lower Leg Wound Laterality: Left, Lateral ?Cleanser: Wound Cleanser 3 x Per Week/30 Days ?Discharge Instructions: Wash your hands with soap and water. Remove old dressing, discard into plastic bag and place into trash. ?Cleanse the wound with Wound Cleanser prior to applying a clean dressing using gauze sponges, not tissues or cotton balls. Do not ?scrub or use excessive force. Pat dry using gauze sponges, not tissue or cotton balls. ?Peri-Wound Care: Desitin Maximum Strength Ointment 4 (oz) 3 x Per Week/30 Days ?Discharge Instructions: Apply around wound edges to avoid maceration ?Topical: Clobetasol Propionate ointment 0.05%, 60 (g) tube 3 x Per Week/30 Days ?Discharge Instructions: Pt to bring to appt- applied to wound  bed ?Primary Dressing: Hydrofera Blue Ready Transfer Foam, 4x5 (in/in) 3 x Per Week/30 Days ?Discharge Instructions: Apply Hydrofera Blue Ready to wound bed as directed ?Secondary Dressing: (NON-BORDER) Zetuvit Plus Silicone NON-BORDER 5x5 (in/in) 3 x Per Week/30 Days ?Compression Wrap: Medichoice 4 layer Compression System, 35-40 mmHG (Generic) 3 x Per Week/30 Days ?Discharge Instructions: Apply multi-layer wrap as directed. ?Electronic Signature(s) ?Signed: 09/19/2021 9:44:52 AM By: Kalman Shan DO ?Signed: 09/19/2021 2:19:31 PM By: Donnamarie Poag ?Entered ByDonnamarie Poag on 09/19/2021 08:24:27 ?[name], [name] ([id number]) ?-------------------------------------------------------------------------------- ?SuperBill Details ?Patient Name: [name], [name] ?Date of Service: 09/19/2021 ?Medical Record Number: [id number] ?Patient Account Number: [id number] ?Date of Birth/Sex: 05-19-1981 (41 y.o. 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

## 2021-09-21 DIAGNOSIS — I872 Venous insufficiency (chronic) (peripheral): Secondary | ICD-10-CM | POA: Diagnosis not present

## 2021-09-21 NOTE — Progress Notes (Signed)
URIYAH, RASKA ([id number]) ?Visit Report for 09/21/2021 ?Physician Orders Details ?Patient Name: [name], [name] ?Date of Service: 09/21/2021 8:00 AM ?Medical Record Number: [id number] ?Patient Account Number: [id number] ?Date of Birth/Sex: [birth year] (41 y.o. M) ?Treating RN: Donnamarie Poag ?Primary Care Provider: [name] Other Clinician: ?Referring Provider: [name] ?Treating Provider/Extender: Jeri Cos ?Weeks in Treatment: 250 ?Verbal / Phone Orders: No ?Diagnosis Coding ?Follow-up Appointments ?o Return Appointment in 1 week. ?o Nurse Visit as needed - twice a week ?Bathing/ Shower/ Hygiene ?o Clean wound with Normal Saline or wound cleanser. ?o May shower with wound dressing protected with water repellent cover or cast protector. ?o No tub bath. ?Edema Control - Lymphedema / Segmental Compressive Device / Other ?o Optional: One layer of unna paste to top of compression wrap (to act as an anchor). - He needs this to hold it up ?o Elevate, Exercise Daily and Avoid Standing for Long Periods of Time. ?o Elevate legs to the level of the heart and pump ankles as often as possible ?o Elevate leg(s) parallel to the floor when sitting. ?Wound Treatment ?Wound #1 - Lower Leg Wound Laterality: Left, Lateral ?Cleanser: Wound Cleanser 3 x Per Week/30 Days ?Discharge Instructions: Wash your hands with soap and water. Remove old dressing, discard into plastic bag and place into trash. ?Cleanse the wound with Wound Cleanser prior to applying a clean dressing using gauze sponges, not tissues or cotton balls. Do not ?scrub or use excessive force. Pat dry using gauze sponges, not tissue or cotton balls. ?Peri-Wound Care: Desitin Maximum Strength Ointment 4 (oz) 3 x Per Week/30 Days ?Discharge Instructions: Apply around wound edges to avoid maceration ?Topical: Clobetasol Propionate ointment 0.05%, 60 (g) tube 3 x Per Week/30 Days ?Discharge Instructions: Pt to bring to appt- applied to wound bed ?Primary  Dressing: Hydrofera Blue Ready Transfer Foam, 4x5 (in/in) 3 x Per Week/30 Days ?Discharge Instructions: Apply Hydrofera Blue Ready to wound bed as directed ?Secondary Dressing: (NON-BORDER) Zetuvit Plus Silicone NON-BORDER 5x5 (in/in) 3 x Per Week/30 Days ?Compression Wrap: Medichoice 4 layer Compression System, 35-40 mmHG (Generic) 3 x Per Week/30 Days ?Discharge Instructions: Apply multi-layer wrap as directed. ?Electronic Signature(s) ?Signed: 09/21/2021 1:28:17 PM By: Donnamarie Poag ?Signed: 09/21/2021 4:41:50 PM By: Worthy Keeler PA-C ?Entered ByDonnamarie Poag on 09/21/2021 08:24:20 ?[name], [name] ([id number]) ?-------------------------------------------------------------------------------- ?SuperBill Details ?Patient Name: [name], [name] ?Date of Service: 09/21/2021 ?Medical Record Number: [id number] ?Patient Account Number: [id number] ?Date of Birth/Sex: Aug 13, 1980 (41 y.o. M) ?Treating RN: Donnamarie Poag ?Primary Care Provider: [name] Other Clinician: ?Referring Provider: [name] ?Treating Provider/Extender: Jeri Cos ?Weeks in Treatment: 250 ?Diagnosis Coding ?ICD-10 Codes ?Code Description ?I87.2 Venous insufficiency (chronic) (peripheral) ?N05.397 Non-pressure chronic ulcer of left calf with fat layer exposed ?E11.622 Type 2 diabetes mellitus with other skin ulcer ?L88 Pyoderma gangrenosum ?F17.208 Nicotine dependence, unspecified, with other nicotine-induced disorders ?Facility Procedures ?CPT4 Code: [id number] ?Description: (Facility Use Only) [phone] - APPLY MULTLAY COMPRS LWR LT LEG ?Modifier: ?Quantity: 1 ?Electronic Signature(s) ?Signed: 09/21/2021 1:28:17 PM By: Donnamarie Poag ?Signed: 09/21/2021 4:41:50 PM By: Worthy Keeler PA-C ?Entered ByDonnamarie Poag on 09/21/2021 08:24:55 ?

## 2021-09-21 NOTE — Progress Notes (Signed)
DEAN, GOLDNER ([id number]) ?Visit Report for 09/21/2021 ?Arrival Information Details ?Patient Name: [name], [name] ?Date of Service: 09/21/2021 8:00 AM ?Medical Record Number: [id number] ?Patient Account Number: [id number] ?Date of Birth/Sex: [birth year] (41 y.o. M) ?Treating RN: Donnamarie Poag ?Primary Care Lamyah Creed: [name] Other Clinician: ?Referring Verne Cove: [name] ?Treating Sevannah Madia/Extender: Jeri Cos ?Weeks in Treatment: 250 ?Visit Information History Since Last Visit ?Added or deleted any medications: No ?Patient Arrived: Ambulatory ?Had a fall or experienced change in No ?Arrival Time: 08:03 ?activities of daily living that may affect ?Accompanied By: self ?risk of falls: ?Transfer Assistance: None ?Hospitalized since last visit: No ?Patient Identification Verified: Yes ?Has Dressing in Place as Prescribed: Yes ?Secondary Verification Process Completed: Yes ?Has Compression in Place as Prescribed: Yes ?Patient Requires Transmission-Based No ?Pain Present Now: No ?Precautions: ?Patient Has Alerts: Yes ?Patient Alerts: Patient has reaction ?to ?silver dressings. ?Electronic Signature(s) ?Signed: 09/21/2021 1:28:17 PM By: Donnamarie Poag ?Entered ByDonnamarie Poag on 09/21/2021 08:23:11 ?EDI, GORNIAK ([id number]) ?-------------------------------------------------------------------------------- ?Compression Therapy Details ?Patient Name: [name], [name] ?Date of Service: 09/21/2021 8:00 AM ?Medical Record Number: [id number] ?Patient Account Number: [id number] ?Date of Birth/Sex: Nov 26, 1980 (41 y.o. M) ?Treating RN: Donnamarie Poag ?Primary Care [name] [name]: [name] Other Clinician: ?Referring Shadee Rathod: [name] ?Treating Nimrat Woolworth/Extender: Jeri Cos ?Weeks in Treatment: 250 ?Compression Therapy Performed for Wound Assessment: Wound #1 Left,Lateral Lower Leg ?Performed By: Clinician Donnamarie Poag, RN ?Compression Type: Four Layer ?Electronic Signature(s) ?Signed: 09/21/2021 1:28:17 PM By: Donnamarie Poag ?Entered  ByDonnamarie Poag on 09/21/2021 08:23:50 ?[name], [name] ([id number]) ?-------------------------------------------------------------------------------- ?Encounter Discharge Information Details ?Patient Name: [name], [name] ?Date of Service: 09/21/2021 8:00 AM ?Medical Record Number: [id number] ?Patient Account Number: [id number] ?Date of Birth/Sex: 1981/02/09 (41 y.o. M) ?Treating RN: Donnamarie Poag ?Primary Care Leandro Berkowitz: [name] Other Clinician: ?Referring Demitra Danley: [name] ?Treating Paralee Pendergrass/Extender: Jeri Cos ?Weeks in Treatment: 250 ?Encounter Discharge Information Items ?Discharge Condition: Stable ?Ambulatory Status: Ambulatory ?Discharge Destination: Home ?Transportation: Private Auto ?Accompanied By: self ?Schedule Follow-up Appointment: Yes ?Clinical Summary of Care: ?Electronic Signature(s) ?Signed: 09/21/2021 1:28:17 PM By: Donnamarie Poag ?Entered ByDonnamarie Poag on 09/21/2021 08:24:44 ?[name], [name] ([id number]) ?-------------------------------------------------------------------------------- ?Wound Assessment Details ?Patient Name: [name], [name] ?Date of Service: 09/21/2021 8:00 AM ?Medical Record Number: [id number] ?Patient Account Number: [id number] ?Date of Birth/Sex: [birth year] (41 y.o. M) ?Treating RN: Donnamarie Poag ?Primary Care Ken Bonn: [name] Other Clinician: ?Referring Mackie Holness: [name] ?Treating Tee Richeson/Extender: Jeri Cos ?Weeks in Treatment: 250 ?Wound Status ?Wound Number: 1 ?Primary Etiology: Pyoderma ?Wound Location: Left, Lateral Lower Leg ?Wound Status: Open ?Wounding Event: Gradually Appeared ?Comorbid History: Sleep Apnea, Hypertension, Colitis ?Date Acquired: 11/18/2015 ?Weeks Of Treatment: 250 ?Clustered Wound: No ?Wound Measurements ?Length: (cm) 3 ?Width: (cm) 2 ?Depth: (cm) 0.2 ?Area: (cm?) 4.712 ?Volume: (cm?) 0.942 ?% Reduction in Area: 4% ?% Reduction in Volume: 76% ?Epithelialization: Medium (34-66%) ?Wound Description ?Classification: Full Thickness With  Exposed Support Structures ?Wound Margin: Flat and Intact ?Exudate Amount: Medium ?Exudate Type: Serosanguineous ?Exudate Color: red, brown ?Foul Odor After Cleansing: No ?Slough/Fibrino Yes ?Wound Bed ?Granulation Amount: Large (67-100%) Exposed Structure ?Granulation Quality: Red, Pink ?Fascia Exposed: No ?Necrotic Amount: Small (1-33%) ?Fat Layer (Subcutaneous Tissue) Exposed: Yes ?Necrotic Quality: Adherent Slough ?Tendon Exposed: No ?Muscle Exposed: No ?Joint Exposed: No ?Bone Exposed: No ?Treatment Notes ?Wound #1 (Lower Leg) Wound Laterality: Left, Lateral ?Cleanser ?Wound Cleanser ?Discharge Instruction: Wash your hands with soap and water. Remove old dressing, discard into plastic bag and place into trash. ?Cleanse the wound with Wound Cleanser prior to applying a clean dressing using gauze sponges,  not tissues or cotton balls. Do ?not scrub or use excessive force. Pat dry using gauze sponges, not tissue or cotton balls. ?Peri-Wound Care ?Desitin Maximum Strength Ointment 4 (oz) ?Discharge Instruction: Apply around wound edges to avoid maceration ?Topical ?Clobetasol Propionate ointment 0.05%, 60 (g) tube ?Discharge Instruction: Pt to bring to appt- applied to wound bed ?Primary Dressing ?Hydrofera Blue Ready Transfer Foam, 4x5 (in/in) ?Discharge Instruction: Apply Hydrofera Blue Ready to wound bed as directed ?Secondary Dressing ?(NON-BORDER) Zetuvit Plus Silicone NON-BORDER 5x5 (in/in) ?[name], [name] ([id number]) ?Secured With ?Compression Wrap ?Medichoice 4 layer Compression System, 35-40 mmHG ?Discharge Instruction: Apply multi-layer wrap as directed. ?Compression Stockings ?Add-Ons ?Electronic Signature(s) ?Signed: 09/21/2021 1:28:17 PM By: Donnamarie Poag ?Entered ByDonnamarie Poag on 09/21/2021 08:23:29 ?

## 2021-09-24 ENCOUNTER — Encounter: Payer: 59 | Admitting: Internal Medicine

## 2021-09-24 DIAGNOSIS — I872 Venous insufficiency (chronic) (peripheral): Secondary | ICD-10-CM | POA: Diagnosis not present

## 2021-09-24 NOTE — Progress Notes (Signed)
LYN, DEEMER ([id number]) ?Visit Report for 09/24/2021 ?Arrival Information Details ?Patient Name: [name], [name] ?Date of Service: 09/24/2021 8:15 AM ?Medical Record Number: [id number] ?Patient Account Number: [id number] ?Date of Birth/Sex: [birth year] (41 y.o. M) ?Treating RN: [name] ?Primary Care Horace Lukas: [name] Other Clinician: ?Referring Zacarias Krauter: [name] ?Treating Kainat Pizana/Extender: Ricard Dillon ?Weeks in Treatment: 250 ?Visit Information History Since Last Visit ?Added or deleted any medications: No ?Patient Arrived: Ambulatory ?Has Dressing in Place as Prescribed: Yes ?Arrival Time: 08:13 ?Has Compression in Place as Prescribed: Yes ?Accompanied By: self ?Pain Present Now: No ?Transfer Assistance: None ?Patient Identification Verified: Yes ?Secondary Verification Process Completed: Yes ?Patient Requires Transmission-Based No ?Precautions: ?Patient Has Alerts: Yes ?Patient Alerts: Patient has reaction ?to ?silver dressings. ?Electronic Signature(s) ?Signed: 09/24/2021 4:28:03 PM By: [name], [name], RN, [name], [name], [name] ?Entered By: [name], [name], RN, [name], [name] on 09/24/2021 08:14:52 ?MAICO, MULVEHILL ([id number]) ?-------------------------------------------------------------------------------- ?Encounter Discharge Information Details ?Patient Name: [name], [name] ?Date of Service: 09/24/2021 8:15 AM ?Medical Record Number: [id number] ?Patient Account Number: [id number] ?Date of Birth/Sex: 05-06-1981 (41 y.o. M) ?Treating RN: [name] ?Primary Care Rilynn Habel: [name] Other Clinician: ?Referring Mariamawit Depaoli: [name] ?Treating Tamyia Minich/Extender: Ricard Dillon ?Weeks in Treatment: 250 ?Encounter Discharge Information Items ?Discharge Condition: Stable ?Ambulatory Status: Ambulatory ?Discharge Destination: Home ?Transportation: Private Auto ?Accompanied By: self ?Schedule Follow-up Appointment: Yes ?Clinical Summary of Care: ?Electronic Signature(s) ?Signed: 09/24/2021 4:28:03 PM By: [name], [name],  RN, [name], [name], [name] ?Entered By: [name], [name], RN, [name], [name] on 09/24/2021 08:53:24 ?GAVINO, FOUCH ([id number]) ?-------------------------------------------------------------------------------- ?Lower Extremity Assessment Details ?Patient Name: [name], [name] ?Date of Service: 09/24/2021 8:15 AM ?Medical Record Number: [id number] ?Patient Account Number: [id number] ?Date of Birth/Sex: [birth year] (41 y.o. M) ?Treating RN: [name] ?Primary Care Victory Dresden: [name] Other Clinician: ?Referring Idali Lafever: [name] ?Treating Tanveer Brammer/Extender: Ricard Dillon ?Weeks in Treatment: 250 ?Edema Assessment ?Assessed: [Left: No] [Right: No] ?[Left: Edema] [Right: :] ?Calf ?Left: Right: ?Point of Measurement: 35 cm From Medial Instep 47 cm ?Ankle ?Left: Right: ?Point of Measurement: 10 cm From Medial Instep 25.3 cm ?Knee To Floor ?Left: Right: ?From Medial Instep 43 cm ?Vascular Assessment ?Pulses: ?Dorsalis Pedis ?Palpable: [Left:Yes] ?Electronic Signature(s) ?Signed: 09/24/2021 4:28:03 PM By: [name], [name], RN, [name], [name], [name] ?Entered By: [name], [name], RN, [name], [name] on 09/24/2021 44:62:86 ?[name], [name] ([id number]) ?-------------------------------------------------------------------------------- ?Multi Wound Chart Details ?Patient Name: [name], [name] ?Date of Service: 09/24/2021 8:15 AM ?Medical Record Number: [id number] ?Patient Account Number: [id number] ?Date of Birth/Sex: 1980/07/02 (41 y.o. M) ?Treating RN: [name] ?Primary Care Toshua Honsinger: [name] Other Clinician: ?Referring Jemima Petko: [name] ?Treating Annaly Skop/Extender: Ricard Dillon ?Weeks in Treatment: 250 ?Vital Signs ?Height(in): 71 ?Pulse(bpm): 80 ?Weight(lbs): 338 ?Blood Pressure(mmHg): 125/81 ?Body Mass Index(BMI): 47.1 ?Temperature(??F): 98.2 ?Respiratory Rate(breaths/min): 18 ?Photos: [N/A:N/A] ?Wound Location: Left, Lateral Lower Leg N/A N/A ?Wounding Event: Gradually Appeared N/A N/A ?Primary Etiology: Pyoderma N/A N/A ?Comorbid History:  Sleep Apnea, Hypertension, Colitis N/A N/A ?Date Acquired: 11/18/2015 N/A N/A ?Weeks of Treatment: 250 N/A N/A ?Wound Status: Open N/A N/A ?Wound Recurrence: No N/A N/A ?Measurements L x W x D (cm) 3x1.9x0.3 N/A N/A ?Area (cm?) : 4.477 N/A N/A ?Volume (cm?) : 1.343 N/A N/A ?% Reduction in Area: 8.80% N/A N/A ?% Reduction in Volume: 65.80% N/A N/A ?Classification: Full Thickness With Exposed N/A N/A ?Support Structures ?Exudate Amount: Medium N/A N/A ?Exudate Type: Serosanguineous N/A N/A ?Exudate Color: red, brown N/A N/A ?Wound Margin: Epibole N/A N/A ?Granulation Amount: Large (67-100%) N/A N/A ?Granulation Quality: Red, Pink, Hyper-granulation  N/A N/A ?Necrotic Amount: Small (1-33%) N/A N/A ?Exposed Structures: ?Fat Layer (Subcutaneous Tissue): N/A N/A ?Yes ?Fascia: No ?Tendon: No ?Muscle: No ?Joint: No ?Bone: No ?Epithelialization: Medium (34-66%) N/A N/A ?Treatment Notes ?Electronic Signature(s) ?Signed: 09/24/2021 4:28:03 PM By: [name], [name], RN, [name], [name], [name] ?Entered By: [name], [name], RN, [name], [name] on 09/24/2021 17:61:60 ?[name], [name] ([id number]) ?-------------------------------------------------------------------------------- ?Multi-Disciplinary Care Plan Details ?Patient Name: [name], [name] ?Date of Service: 09/24/2021 8:15 AM ?Medical Record Number: [id number] ?Patient Account Number: [id number] ?Date of Birth/Sex: 1980-08-22 (41 y.o. M) ?Treating RN: [name] ?Primary Care Koleman Marling: [name] Other Clinician: ?Referring Sotiria Keast: [name] ?Treating Arianah Torgeson/Extender: Ricard Dillon ?Weeks in Treatment: 250 ??oo Multidisciplinary Care Plan reviewed with physician ?Active Inactive ?Necrotic Tissue ?Nursing Diagnoses: ?Impaired tissue integrity related to necrotic/devitalized tissue ?Knowledge deficit related to management of necrotic/devitalized tissue ?Goals: ?Necrotic/devitalized tissue will be minimized in the wound bed ?Date Initiated: 03/26/2021 ?Target Resolution Date: 08/24/2021 ?Goal Status:  Active ?Patient/caregiver will verbalize understanding of reason and process for debridement of necrotic tissue ?Date Initiated: 03/26/2021 ?Date Inactivated: 04/27/2021 ?Target Resolution Date: 03/26/2021 ?Goal Status: Met ?Interventions: ?Assess patient pain level pre-, during and post procedure and prior to discharge ?Provide education on necrotic tissue and debridement process ?Treatment Activities: ?Apply topical anesthetic as ordered : 03/26/2021 ?Notes: ?Electronic Signature(s) ?Signed: 09/24/2021 4:28:03 PM By: [name], [name], RN, [name], [name], [name] ?Entered By: [name], [name], RN, [name], [name] on 09/24/2021 08:39:18 ?[name], [name] ([id number]) ?-------------------------------------------------------------------------------- ?Pain Assessment Details ?Patient Name: [name], [name] ?Date of Service: 09/24/2021 8:15 AM ?Medical Record Number: [id number] ?Patient Account Number: [id number] ?Date of Birth/Sex: Mar 11, 1981 (41 y.o. M) ?Treating RN: [name] ?Primary Care Kyler Lerette: [name] Other Clinician: ?Referring Prudy Candy: [name] ?Treating Brendalee Matthies/Extender: Ricard Dillon ?Weeks in Treatment: 250 ?Active Problems ?Location of Pain Severity and Description of Pain ?Patient Has Paino No ?Site Locations ?Pain Management and Medication ?Current Pain Management: ?Notes ?Patient denies pain at this time. ?Electronic Signature(s) ?Signed: 09/24/2021 4:28:03 PM By: [name], [name], RN, [name], [name], [name] ?Entered By: [name], [name], RN, [name], [name] on 09/24/2021 08:16:05 ?[name], [name] ([id number]) ?-------------------------------------------------------------------------------- ?Patient/Caregiver Education Details ?Patient Name: [name], [name] ?Date of Service: 09/24/2021 8:15 AM ?Medical Record Number: [id number] ?Patient Account Number: [id number] ?Date of Birth/Gender: [birth year] (41 y.o. M) ?Treating RN: [name] ?Primary Care Physician: [name] Other Clinician: ?Referring Physician: [name] ?Treating Physician/Extender:  Ricard Dillon ?Weeks in Treatment: 250 ?Education Assessment ?Education Provided To: ?Patient ?Education Topics Provided ?Venous: ?Handouts: Controlling Swelling with Multilayered Compression Wraps ?M

## 2021-09-25 NOTE — Progress Notes (Signed)
SHO, SALGUERO ([id number]) ?Visit Report for 09/24/2021 ?HPI Details ?Patient Name: [name], [name] ?Date of Service: 09/24/2021 8:15 AM ?Medical Record Number: [id number] ?Patient Account Number: [id number] ?Date of Birth/Sex: 30-Dec-1980 (41 y.o. M) ?Treating RN: [name] ?Primary Care Provider: [name] Other Clinician: ?Referring Provider: [name] ?Treating Provider/Extender: Ricard Dillon ?Weeks in Treatment: 250 ?History of Present Illness ?HPI Description: 12/04/16; 41 year old man who comes into the clinic today for review of a wound on the posterior left calf. He tells me that is ?been there for about a year. He is not a diabetic he does smoke half a pack per day. He was seen in the ER on 11/20/16 felt to have cellulitis around ?the wound and was given clindamycin. An x-ray did not show osteomyelitis. The patient initially tells me that he has a milk allergy that sets off a ?pruritic itching rash on his lower legs which she scratches incessantly and he thinks that's what may have set up the wound. He has been using ?various topical antibiotics and ointments without any effect. He works in a [employer]ing [employer] and is on his feet all day. He does not have a prior ?history of wounds however he does have the rash on both lower legs the right arm and the ventral aspect of his left arm. These are excoriations ?and clearly have had scratching however there are of macular looking areas on both legs including a substantial larger area on the right leg. This ?does not have an underlying open area. There is no blistering. 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 He seemed to recognize this. On further questioning he admits ?to a 5 year history of quiesced. ulcerative colitis. He is not in any treatment for this. He's had no recent  travel ?12/11/16; the patient arrives today with his wound and roughly the same condition we've been using silver alginate this is a deep punched out ?wound with some surrounding erythema but no tenderness. 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 I'm really not able to understand this ?We have also received information from the patient's dermatologist in [place] notes from April 2016. This was a doctor Agarwal-antal. 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 He was prescribed topical steroid high potency under occlusion which helped but at this point the ?patient did not have a deep punched out wound. ?12/18/16; the patient's wound is larger in terms of surface area however this surface looks better and there is less depth. The surrounding erythema ?also is better. The patient states that the wrap we put on came off 2 days ago when he has been using his compression stockings. He we are in ?the process of getting a dermatology consult. ?12/26/16 on evaluation today patient's left lower extremity wound shows evidence of infection with surrounding erythema noted. He has been ?tolerating the dressing changes but states that he has noted more discomfort. There is a larger area of erythema surrounding the wound. No ?fevers, chills, nausea, or vomiting noted at this time. With that being said the wound still does have slough covering the surface. He is not allergic ?to any medication that he is aware of at this point.  In regard to his right lower extremity he had several regions that are erythematous and ?pruritic he wonders if there's anything we can do to help that. ?01/02/17 I reviewed patient's wound culture which was obtained his visit last week. He was placed on doxycycline at that point. Unfortunately that ?does not appear to be an antibiotic that would likely  help with the situation however the pseudomonas noted on culture is sensitive to Cipro. Also ?unfortunately patient's wound seems to have a large compared to last week's evaluation. Not severely so but there are definitely increased ?measurements in general. He is continuing to have discomfort as well he writes this to be a seven out of 10. In fact he would prefer me not to ?perform any debridement today due to the fact that he is having discomfort and considering he has an active infection on the little reluctant to do ?so anyway. No fevers, chills, nausea, or vomiting noted at this time. ?01/08/17; patient seems dermatology on September 5. I suspect dermatology will want the slides from the biopsy I did sent to their pathologist. ?I'm not sure if there is a way we can expedite that. In any case the culture I did before I left on vacation 3 weeks ago showed Pseudomonas he ?was given 10 days of Cipro and per her description of her intake nurses is actually somewhat better this week although the wound is quite a bit ?bigger than I remember the last time I saw this. He still has 3 more days of Cipro ?01/21/17; dermatology appointment tomorrow. He has completed the ciprofloxacin for Pseudomonas. Surface of the wound looks better however he ?is had some deterioration in the lesions on his right leg. Meantime the left lateral leg wound we will continue with sample ?01/29/17; patient had his dermatology appointment but I can't yet see that note. He is completed his antibiotics. The wound is more superficial but ?considerably larger in circumferential area than when he came in. This is in his left lateral calf. He also has swollen erythematous areas with ?superficial wounds on the right leg and small papular areas on both arms. There apparently areas in her his upper thighs and buttocks I did not ?look at those. Dermatology biopsied the right leg. Hopefully will have their input next week. ?02/05/17; patient went back to see  his dermatologist who told him that he had a "scratching problem" as well as staph. He is now on a 30 day ?course of doxycycline and I believe she gave him triamcinolone cream to the right leg areas to help with the itching [not exactly sure but probably ?triamcinolone]. She apparently looked at the left lateral leg wound although this was not rebiopsied and I think felt to be ultimately part of the ?same pathogenesis. He is using sample border foam and changing nevus himself. He now has a new open area on the right posterior leg which ?was his biopsy site ?I don't have any of the dermatology notes ?02/12/17; we put the patient in compression last week with SANTYL to the wound on the left leg and the biopsy. Edema is much better and the ?depth of the wound is now at level of skin. Area is still the same ?oBiopsy site on the right lateral leg we've also been using santyl with a border foam dressing and he is changing this himself. ?02/19/17; Using silver alginate started last week to both the substantial left leg wound and the biopsy site on the right wound. He is tolerating ?compression  well. Has a an appointment with his primary M.D. tomorrow wondering about diuretics although I'm wondering if the edema problem ?is actually lymphedema ?02/26/17; the patient has been to see his primary doctor Dr. Jerrel Ivory at [facility] our primary care. She started him on Lasix 20 mg and this ?[name], [name] ([id number]) ?seems to have helped with the edema. However we are not making substantial change with the left lateral calf wound and inflammation. The ?biopsy site on the right leg also looks stable but not really all that different. ?03/12/17; the patient has been to see vein and vascular Dr. Lucky Cowboy. He has had venous reflux studies I have not reviewed these. I did get a call ?from his dermatology office. They felt that he might have pathergy based on their biopsy on his right leg which led them to look at the slides of ?the biopsy  I did on the left leg and they wonder whether this represents pyoderma gangrenosum which was the original supposition in a man with ?ulcerative colitis albeit inactive for many years. They therefore recommended

## 2021-09-26 DIAGNOSIS — I872 Venous insufficiency (chronic) (peripheral): Secondary | ICD-10-CM | POA: Diagnosis not present

## 2021-09-26 NOTE — Progress Notes (Signed)
RILEN, SHUKLA ([id number]) ?Visit Report for 09/26/2021 ?Physician Orders Details ?Patient Name: [name], [name] ?Date of Service: 09/26/2021 8:00 AM ?Medical Record Number: [id number] ?Patient Account Number: [id number] ?Date of Birth/Sex: [birth year] (41 y.o. M) ?Treating RN: Donnamarie Poag ?Primary Care Provider: [name] Other Clinician: ?Referring Provider: [name] ?Treating Provider/Extender: Kalman Shan ?Weeks in Treatment: 251 ?Verbal / Phone Orders: No ?Diagnosis Coding ?Follow-up Appointments ?o Return Appointment in 1 week. ?o Nurse Visit as needed - twice a week ?Bathing/ Shower/ Hygiene ?o Clean wound with Normal Saline or wound cleanser. ?o May shower with wound dressing protected with water repellent cover or cast protector. ?o No tub bath. ?Edema Control - Lymphedema / Segmental Compressive Device / Other ?o Optional: One layer of unna paste to top of compression wrap (to act as an anchor). - He needs this to hold it up ?o Elevate, Exercise Daily and Avoid Standing for Long Periods of Time. ?o Elevate legs to the level of the heart and pump ankles as often as possible ?o Elevate leg(s) parallel to the floor when sitting. ?Wound Treatment ?Wound #1 - Lower Leg Wound Laterality: Left, Lateral ?Cleanser: Wound Cleanser 3 x Per Week/30 Days ?Discharge Instructions: Wash your hands with soap and water. Remove old dressing, discard into plastic bag and place into trash. ?Cleanse the wound with Wound Cleanser prior to applying a clean dressing using gauze sponges, not tissues or cotton balls. Do not ?scrub or use excessive force. Pat dry using gauze sponges, not tissue or cotton balls. ?Peri-Wound Care: Desitin Maximum Strength Ointment 4 (oz) 3 x Per Week/30 Days ?Discharge Instructions: Apply around wound edges to avoid maceration ?Topical: Clobetasol Propionate ointment 0.05%, 60 (g) tube 3 x Per Week/30 Days ?Discharge Instructions: Pt to bring to appt- applied to wound  bed ?Primary Dressing: Hydrofera Blue Ready Transfer Foam, 4x5 (in/in) 3 x Per Week/30 Days ?Discharge Instructions: Apply Hydrofera Blue Ready to wound bed as directed ?Secondary Dressing: (NON-BORDER) Zetuvit Plus Silicone NON-BORDER 5x5 (in/in) 3 x Per Week/30 Days ?Compression Wrap: Medichoice 4 layer Compression System, 35-40 mmHG (Generic) 3 x Per Week/30 Days ?Discharge Instructions: Apply multi-layer wrap as directed. ?Electronic Signature(s) ?Signed: 09/26/2021 12:25:41 PM By: Kalman Shan DO ?Signed: 09/26/2021 2:56:37 PM By: Donnamarie Poag ?Entered ByDonnamarie Poag on 09/26/2021 08:26:34 ?[name], [name] ([id number]) ?-------------------------------------------------------------------------------- ?SuperBill Details ?Patient Name: [name], [name] ?Date of Service: 09/26/2021 ?Medical Record Number: [id number] ?Patient Account Number: [id number] ?Date of Birth/Sex: 07-15-1980 (41 y.o. 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

## 2021-09-26 NOTE — Progress Notes (Signed)
JOCOB, DAMBACH ([id number]) ?Visit Report for 09/26/2021 ?Arrival Information Details ?Patient Name: [name], [name] ?Date of Service: 09/26/2021 8:00 AM ?Medical Record Number: [id number] ?Patient Account Number: [id number] ?Date of Birth/Sex: Mar 02, 1981 (41 y.o. M) ?Treating RN: Donnamarie Poag ?Primary Care Jacqualin Shirkey: [name] Other Clinician: ?Referring Ebon Ketchum: [name] ?Treating Makhya Arave/Extender: Kalman Shan ?Weeks in Treatment: 251 ?Visit Information History Since Last Visit ?Added or deleted any medications: No ?Patient Arrived: Ambulatory ?Had a fall or experienced change in No ?Arrival Time: 08:02 ?activities of daily living that may affect ?Accompanied By: self ?risk of falls: ?Transfer Assistance: None ?Signs or symptoms of abuse/neglect since last visito No ?Patient Identification Verified: Yes ?Hospitalized since last visit: No ?Secondary Verification Process Completed: Yes ?Has Dressing in Place as Prescribed: Yes ?Patient Requires Transmission-Based No ?Has Compression in Place as Prescribed: Yes ?Precautions: ?Pain Present Now: No ?Patient Has Alerts: Yes ?Patient Alerts: Patient has reaction ?to ?silver dressings. ?Electronic Signature(s) ?Signed: 09/26/2021 2:56:37 PM By: Donnamarie Poag ?Entered ByDonnamarie Poag on 09/26/2021 08:21:35 ?[name], [name] ([id number]) ?-------------------------------------------------------------------------------- ?Compression Therapy Details ?Patient Name: [name], [name] ?Date of Service: 09/26/2021 8:00 AM ?Medical Record Number: [id number] ?Patient Account Number: [id number] ?Date of Birth/Sex: 1981/05/09 (41 y.o. M) ?Treating RN: Donnamarie Poag ?Primary Care Jakiyah Stepney: [name] Other Clinician: ?Referring Deane Wattenbarger: [name] ?Treating Jaide Hillenburg/Extender: Kalman Shan ?Weeks in Treatment: 251 ?Compression Therapy Performed for Wound Assessment: Wound #1 Left,Lateral Lower Leg ?Performed By: Clinician Donnamarie Poag, RN ?Compression Type: Four  Layer ?Electronic Signature(s) ?Signed: 09/26/2021 2:56:37 PM By: Donnamarie Poag ?Entered ByDonnamarie Poag on 09/26/2021 08:22:51 ?RADEK, CARNERO ([id number]) ?-------------------------------------------------------------------------------- ?Encounter Discharge Information Details ?Patient Name: [name], [name] ?Date of Service: 09/26/2021 8:00 AM ?Medical Record Number: [id number] ?Patient Account Number: [id number] ?Date of Birth/Sex: 02/27/1981 (41 y.o. M) ?Treating RN: Donnamarie Poag ?Primary Care Latissa Frick: [name] Other Clinician: ?Referring Lotta Frankenfield: [name] ?Treating Sena Hoopingarner/Extender: Kalman Shan ?Weeks in Treatment: 251 ?Encounter Discharge Information Items ?Discharge Condition: Stable ?Ambulatory Status: Ambulatory ?Discharge Destination: Home ?Transportation: Private Auto ?Accompanied By: self ?Schedule Follow-up Appointment: Yes ?Clinical Summary of Care: ?Electronic Signature(s) ?Signed: 09/26/2021 2:56:37 PM By: Donnamarie Poag ?Entered ByDonnamarie Poag on 09/26/2021 08:27:00 ?[name], [name] ([id number]) ?-------------------------------------------------------------------------------- ?Wound Assessment Details ?Patient Name: [name], [name] ?Date of Service: 09/26/2021 8:00 AM ?Medical Record Number: [id number] ?Patient Account Number: [id number] ?Date of Birth/Sex: [birth year] (41 y.o. M) ?Treating RN: Donnamarie Poag ?Primary Care Kierstynn Babich: [name] Other Clinician: ?Referring Kymani Laursen: [name] ?Treating Shaivi Rothschild/Extender: Kalman Shan ?Weeks in Treatment: 251 ?Wound Status ?Wound Number: 1 ?Primary Etiology: Pyoderma ?Wound Location: Left, Lateral Lower Leg ?Wound Status: Open ?Wounding Event: Gradually Appeared ?Comorbid History: Sleep Apnea, Hypertension, Colitis ?Date Acquired: 11/18/2015 ?Weeks Of Treatment: 251 ?Clustered Wound: No ?Wound Measurements ?Length: (cm) 3 ?Width: (cm) 1.9 ?Depth: (cm) 0.3 ?Area: (cm?) 4.477 ?Volume: (cm?) 1.343 ?% Reduction in Area: 8.8% ?% Reduction in  Volume: 65.8% ?Epithelialization: Medium (34-66%) ?Wound Description ?Classification: Full Thickness With Exposed Support Structures ?Wound Margin: Epibole ?Exudate Amount: Medium ?Exudate Type: Serosanguineous ?Exudate Color: red, brown ?Foul Odor After Cleansing: No ?Slough/Fibrino Yes ?Wound Bed ?Granulation Amount: Large (67-100%) Exposed Structure ?Granulation Quality: Red, Pink, Hyper-granulation ?Fascia Exposed: No ?Necrotic Amount: Small (1-33%) ?Fat Layer (Subcutaneous Tissue) Exposed: Yes ?Necrotic Quality: Adherent Slough ?Tendon Exposed: No ?Muscle Exposed: No ?Joint Exposed: No ?Bone Exposed: No ?Treatment Notes ?Wound #1 (Lower Leg) Wound Laterality: Left, Lateral ?Cleanser ?Wound Cleanser ?Discharge Instruction: Wash your hands with soap and water. Remove old dressing, discard into plastic bag and place into trash. ?Cleanse the wound with Wound Cleanser prior  to applying a clean dressing using gauze sponges, not tissues or cotton balls. Do ?not scrub or use excessive force. Pat dry using gauze sponges, not tissue or cotton balls. ?Peri-Wound Care ?Desitin Maximum Strength Ointment 4 (oz) ?Discharge Instruction: Apply around wound edges to avoid maceration ?Topical ?Clobetasol Propionate ointment 0.05%, 60 (g) tube ?Discharge Instruction: Pt to bring to appt- applied to wound bed ?Primary Dressing ?Hydrofera Blue Ready Transfer Foam, 4x5 (in/in) ?Discharge Instruction: Apply Hydrofera Blue Ready to wound bed as directed ?Secondary Dressing ?(NON-BORDER) Zetuvit Plus Silicone NON-BORDER 5x5 (in/in) ?[name], [name] ([id number]) ?Secured With ?Compression Wrap ?Medichoice 4 layer Compression System, 35-40 mmHG ?Discharge Instruction: Apply multi-layer wrap as directed. ?Compression Stockings ?Add-Ons ?Electronic Signature(s) ?Signed: 09/26/2021 2:56:37 PM By: Donnamarie Poag ?Entered ByDonnamarie Poag on 09/26/2021 08:22:00 ?

## 2021-09-28 DIAGNOSIS — I872 Venous insufficiency (chronic) (peripheral): Secondary | ICD-10-CM | POA: Diagnosis not present

## 2021-09-28 NOTE — Progress Notes (Signed)
GATES, JIVIDEN ([id number]) ?Visit Report for 09/28/2021 ?Arrival Information Details ?Patient Name: [name], [name] ?Date of Service: 09/28/2021 8:00 AM ?Medical Record Number: [id number] ?Patient Account Number: [id number] ?Date of Birth/Sex: 1980/12/06 (41 y.o. M) ?Treating RN: Donnamarie Poag ?Primary Care Nivan Melendrez: [name] Other Clinician: ?Referring Brittain Smithey: [name] ?Treating Irving Bloor/Extender: Jeri Cos ?Weeks in Treatment: 251 ?Visit Information History Since Last Visit ?Added or deleted any medications: No ?Patient Arrived: Ambulatory ?Had a fall or experienced change in No ?Arrival Time: 08:01 ?activities of daily living that may affect ?Accompanied By: self ?risk of falls: ?Transfer Assistance: None ?Hospitalized since last visit: No ?Patient Identification Verified: Yes ?Has Dressing in Place as Prescribed: Yes ?Secondary Verification Process Completed: Yes ?Has Compression in Place as Prescribed: Yes ?Patient Requires Transmission-Based No ?Pain Present Now: No ?Precautions: ?Patient Has Alerts: Yes ?Patient Alerts: Patient has reaction ?to ?silver dressings. ?Electronic Signature(s) ?Signed: 09/28/2021 3:46:47 PM By: Donnamarie Poag ?Entered ByDonnamarie Poag on 09/28/2021 08:14:12 ?TYR, FRANCA ([id number]) ?-------------------------------------------------------------------------------- ?Encounter Discharge Information Details ?Patient Name: [name] ?Date of Service: 09/28/2021 8:00 AM ?Medical Record Number: [id number] ?Patient Account Number: [id number] ?Date of Birth/Sex: [birth year] (41 y.o. M) ?Treating RN: Donnamarie Poag ?Primary Care Jasey Cortez: [name] Other Clinician: ?Referring Yelena Metzer: [name] ?Treating Tamila Gaulin/Extender: Jeri Cos ?Weeks in Treatment: 251 ?Encounter Discharge Information Items ?Discharge Condition: Stable ?Ambulatory Status: Ambulatory ?Discharge Destination: Home ?Transportation: Private Auto ?Accompanied By: self ?Schedule Follow-up Appointment:  Yes ?Clinical Summary of Care: ?Electronic Signature(s) ?Signed: 09/28/2021 3:46:47 PM By: Donnamarie Poag ?Entered ByDonnamarie Poag on 09/28/2021 08:17:45 ?HASHEEM, VOLAND ([id number]) ?-------------------------------------------------------------------------------- ?Wound Assessment Details ?Patient Name: [name] ?Date of Service: 09/28/2021 8:00 AM ?Medical Record Number: [id number] ?Patient Account Number: [id number] ?Date of Birth/Sex: [birth year] (41 y.o. M) ?Treating RN: Donnamarie Poag ?Primary Care Scotti Motter: [name] Other Clinician: ?Referring Kasch Borquez: [name] ?Treating Nakiea Metzner/Extender: Jeri Cos ?Weeks in Treatment: 251 ?Wound Status ?Wound Number: 1 ?Primary Etiology: Pyoderma ?Wound Location: Left, Lateral Lower Leg ?Wound Status: Open ?Wounding Event: Gradually Appeared ?Comorbid History: Sleep Apnea, Hypertension, Colitis ?Date Acquired: 11/18/2015 ?Weeks Of Treatment: 251 ?Clustered Wound: No ?Wound Measurements ?Length: (cm) 3 ?Width: (cm) 1.9 ?Depth: (cm) 0.3 ?Area: (cm?) 4.477 ?Volume: (cm?) 1.343 ?% Reduction in Area: 8.8% ?% Reduction in Volume: 65.8% ?Epithelialization: Medium (34-66%) ?Wound Description ?Classification: Full Thickness With Exposed Support Structures ?Wound Margin: Epibole ?Exudate Amount: Medium ?Exudate Type: Serosanguineous ?Exudate Color: red, brown ?Foul Odor After Cleansing: No ?Slough/Fibrino Yes ?Wound Bed ?Granulation Amount: Large (67-100%) Exposed Structure ?Granulation Quality: Red, Pink, Hyper-granulation ?Fascia Exposed: No ?Necrotic Amount: Small (1-33%) ?Fat Layer (Subcutaneous Tissue) Exposed: Yes ?Necrotic Quality: Adherent Slough ?Tendon Exposed: No ?Muscle Exposed: No ?Joint Exposed: No ?Bone Exposed: No ?Treatment Notes ?Wound #1 (Lower Leg) Wound Laterality: Left, Lateral ?Cleanser ?Wound Cleanser ?Discharge Instruction: Wash your hands with soap and water. Remove old dressing, discard into plastic bag and place into trash. ?Cleanse the wound with  Wound Cleanser prior to applying a clean dressing using gauze sponges, not tissues or cotton balls. Do ?not scrub or use excessive force. Pat dry using gauze sponges, not tissue or cotton balls. ?Peri-Wound Care ?Desitin Maximum Strength Ointment 4 (oz) ?Discharge Instruction: Apply around wound edges to avoid maceration ?Topical ?Clobetasol Propionate ointment 0.05%, 60 (g) tube ?Discharge Instruction: Pt to bring to appt- applied to wound bed ?Primary Dressing ?Hydrofera Blue Ready Transfer Foam, 4x5 (in/in) ?Discharge Instruction: Apply Hydrofera Blue Ready to wound bed as directed ?Secondary Dressing ?(NON-BORDER) Zetuvit Plus Silicone NON-BORDER 5x5 (in/in) ?KAYSTON, JODOIN ([id number]) ?Secured With ?Compression  Wrap ?Medichoice 4 layer Compression System, 35-40 mmHG ?Discharge Instruction: Apply multi-layer wrap as directed. ?Compression Stockings ?Add-Ons ?Electronic Signature(s) ?Signed: 09/28/2021 3:46:47 PM By: Donnamarie Poag ?Entered ByDonnamarie Poag on 09/28/2021 08:14:29 ?

## 2021-09-29 NOTE — Progress Notes (Signed)
[name], [name] ([id number]) ?Visit Report for 09/28/2021 ?Physician Orders Details ?Patient Name: [name], [name] ?Date of Service: 09/28/2021 8:00 AM ?Medical Record Number: [id number] ?Patient Account Number: [id number] ?Date of Birth/Sex: Apr 07, 1981 (41 y.o. M) ?Treating RN: Donnamarie Poag ?Primary Care Provider: [name] Other Clinician: ?Referring Provider: [name] ?Treating Provider/Extender: Jeri Cos ?Weeks in Treatment: 251 ?Verbal / Phone Orders: No ?Diagnosis Coding ?Follow-up Appointments ?o Return Appointment in 1 week. ?o Nurse Visit as needed - twice a week ?Bathing/ Shower/ Hygiene ?o Clean wound with Normal Saline or wound cleanser. ?o May shower with wound dressing protected with water repellent cover or cast protector. ?o No tub bath. ?Edema Control - Lymphedema / Segmental Compressive Device / Other ?o Optional: One layer of unna paste to top of compression wrap (to act as an anchor). - He needs this to hold it up ?o Elevate, Exercise Daily and Avoid Standing for Long Periods of Time. ?o Elevate legs to the level of the heart and pump ankles as often as possible ?o Elevate leg(s) parallel to the floor when sitting. ?Wound Treatment ?Wound #1 - Lower Leg Wound Laterality: Left, Lateral ?Cleanser: Wound Cleanser 3 x Per Week/30 Days ?Discharge Instructions: Wash your hands with soap and water. Remove old dressing, discard into plastic bag and place into trash. ?Cleanse the wound with Wound Cleanser prior to applying a clean dressing using gauze sponges, not tissues or cotton balls. Do not ?scrub or use excessive force. Pat dry using gauze sponges, not tissue or cotton balls. ?Peri-Wound Care: Desitin Maximum Strength Ointment 4 (oz) 3 x Per Week/30 Days ?Discharge Instructions: Apply around wound edges to avoid maceration ?Topical: Clobetasol Propionate ointment 0.05%, 60 (g) tube 3 x Per Week/30 Days ?Discharge Instructions: Pt to bring to appt- applied to wound  bed ?Primary Dressing: Hydrofera Blue Ready Transfer Foam, 4x5 (in/in) 3 x Per Week/30 Days ?Discharge Instructions: Apply Hydrofera Blue Ready to wound bed as directed ?Secondary Dressing: (NON-BORDER) Zetuvit Plus Silicone NON-BORDER 5x5 (in/in) 3 x Per Week/30 Days ?Compression Wrap: Medichoice 4 layer Compression System, 35-40 mmHG (Generic) 3 x Per Week/30 Days ?Discharge Instructions: Apply multi-layer wrap as directed. ?Electronic Signature(s) ?Signed: 09/28/2021 3:46:47 PM By: Donnamarie Poag ?Signed: 09/28/2021 6:04:02 PM By: Worthy Keeler PA-C ?Entered ByDonnamarie Poag on 09/28/2021 08:16:53 ?[name], [name] ([id number]) ?-------------------------------------------------------------------------------- ?SuperBill Details ?Patient Name: [name], [name] ?Date of Service: 09/28/2021 ?Medical Record Number: [id number] ?Patient Account Number: [id number] ?Date of Birth/Sex: 1981/03/26 (41 y.o. 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

## 2021-10-01 ENCOUNTER — Encounter: Payer: 59 | Admitting: Physician Assistant

## 2021-10-01 DIAGNOSIS — I872 Venous insufficiency (chronic) (peripheral): Secondary | ICD-10-CM | POA: Diagnosis not present

## 2021-10-01 NOTE — Progress Notes (Addendum)
AGRON, SWINEY ([id number]) ?Visit Report for 10/01/2021 ?Chief Complaint Document Details ?Patient Name: [name], [name] ?Date of Service: 10/01/2021 8:15 AM ?Medical Record Number: [id number] ?Patient Account Number: [id number] ?Date of Birth/Sex: August 07, 1980 (41 y.o. M) ?Treating RN: Levora Dredge ?Primary Care Provider: [name] Other Clinician: ?Referring Provider: [name] ?Treating Provider/Extender: Jeri Cos ?Weeks in Treatment: 251 ?Information Obtained from: Patient ?Chief Complaint ?He is here in follow up evaluation for LLE pyoderma ulcer ?Electronic Signature(s) ?Signed: 10/01/2021 8:20:13 AM By: Worthy Keeler PA-C ?Entered By: Worthy Keeler on 10/01/2021 08:20:13 ?[name], [name] ([id number]) ?-------------------------------------------------------------------------------- ?HPI Details ?Patient Name: [name], [name] ?Date of Service: 10/01/2021 8:15 AM ?Medical Record Number: [id number] ?Patient Account Number: [id number] ?Date of Birth/Sex: 08-22-1980 (41 y.o. M) ?Treating RN: Levora Dredge ?Primary Care Provider: [name] Other Clinician: ?Referring Provider: [name] ?Treating Provider/Extender: Jeri Cos ?Weeks in Treatment: 251 ?History of Present Illness ?HPI Description: 12/04/16; 41 year old man who comes into the clinic today for review of a wound on the posterior left calf. He tells me that is ?been there for about a year. He is not a diabetic he does smoke half a pack per day. He was seen in the ER on 11/20/16 felt to have cellulitis around ?the wound and was given clindamycin. An x-ray did not show osteomyelitis. The patient initially tells me that he has a milk allergy that sets off a ?pruritic itching rash on his lower legs which she scratches incessantly and he thinks that's what may have set up the wound. He has been using ?various topical antibiotics and ointments without any effect. He works in a [employer]ing [employer] and is on his feet all day. He does not have a  prior ?history of wounds however he does have the rash on both lower legs the right arm and the ventral aspect of his left arm. These are excoriations ?and clearly have had scratching however there are of macular looking areas on both legs including a substantial larger area on the right leg. This ?does not have an underlying open area. There is no blistering. 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 He seemed to recognize this. On further questioning he admits ?to a 5 year history of quiesced. ulcerative colitis. He is not in any treatment for this. He's had no recent travel ?12/11/16; the patient arrives today with his wound and roughly the same condition we've been using silver alginate this is a deep punched out ?wound with some surrounding erythema but no tenderness. 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 I'm really not able to understand this ?We have also received information from the patient's dermatologist in [place] notes from April 2016. This was a doctor Agarwal-antal. 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 He was prescribed topical steroid high potency under occlusion which helped but at this point the ?patient did not have a deep punched out wound. ?12/18/16; the patient's wound is larger in terms of surface area however this surface looks better and there is less depth. The surrounding erythema ?also is better. The patient states that the wrap we put on came off 2 days ago when he has been using his compression stockings.  He we are in ?the process of getting a dermatology consult. ?12/26/16 on evaluation today patient's left lower extremity wound shows evidence of  infection with surrounding erythema noted. He has been ?tolerating the dressing changes but states that he has noted more discomfort. There is a larger area of erythema surrounding the wound. No ?fevers, chills, nausea, or vomiting noted at this time. With that being said the wound still does have slough covering the surface. He is not allergic ?to any medication that he is aware of at this point. In regard to his right lower extremity he had several regions that are erythematous and ?pruritic he wonders if there's anything we can do to help that. ?01/02/17 I reviewed patient's wound culture which was obtained his visit last week. He was placed on doxycycline at that point. Unfortunately that ?does not appear to be an antibiotic that would likely help with the situation however the pseudomonas noted on culture is sensitive to Cipro. Also ?unfortunately patient's wound seems to have a large compared to last week's evaluation. Not severely so but there are definitely increased ?measurements in general. He is continuing to have discomfort as well he writes this to be a seven out of 10. In fact he would prefer me not to ?perform any debridement today due to the fact that he is having discomfort and considering he has an active infection on the little reluctant to do ?so anyway. No fevers, chills, nausea, or vomiting noted at this time. ?01/08/17; patient seems dermatology on September 5. I suspect dermatology will want the slides from the biopsy I did sent to their pathologist. ?I'm not sure if there is a way we can expedite that. In any case the culture I did before I left on vacation 3 weeks ago showed Pseudomonas he ?was given 10 days of Cipro and per her description of her intake nurses is actually somewhat better this week although the wound is quite a bit ?bigger than I remember the last time I saw this. He still has 3 more days of Cipro ?01/21/17; dermatology appointment tomorrow. He has completed the ciprofloxacin  for Pseudomonas. Surface of the wound looks better however he ?is had some deterioration in the lesions on his right leg. Meantime the left lateral leg wound we will continue with sample ?01/29/17; patient had his dermatology appointment but I can't yet see that note. He is completed his antibiotics. The wound is more superficial but ?considerably larger in circumferential area than when he came in. This is in his left lateral calf. He also has swollen erythematous areas with ?superficial wounds on the right leg and small papular areas on both arms. There apparently areas in her his upper thighs and buttocks I did not ?look at those. Dermatology biopsied the right leg. Hopefully will have their input next week. ?02/05/17; patient went back to see his dermatologist who told him that he had a "scratching problem" as well as staph. He is now on a 30 day ?course of doxycycline and I believe she gave him triamcinolone cream to the right leg areas to help with the itching [not exactly sure but probably ?triamcinolone]. She apparently looked at the left lateral leg wound although this was not rebiopsied and I think felt to be ultimately part of the ?same pathogenesis. He is using sample border foam and changing nevus himself. He now has a new open area on the right posterior leg which ?was his biopsy site ?I don't have any of the dermatology notes ?  02/12/17; we put the patient in compression last week with SANTYL to the wound on the left leg and the biopsy. Edema is much better and the ?depth of the wound is now at level of skin. Area is still the same ?oBiopsy site on the right lateral leg we've also been using santyl with a border foam dressing and he is changing this himself. ?02/19/17; Using silver alginate started last week to both the substantial left leg wound and the biopsy site on the right wound. He is tolerating ?compression well. Has a an appointment with his primary M.D. tomorrow wondering about diuretics although  I'm wondering if the edema problem ?is actually lymphedema ?02/26/17; the patient has been to see his primary doctor Dr. Jerrel Ivory at [facility] our primary care. She started him on Lasix 20 mg and this ?seem

## 2021-10-01 NOTE — Progress Notes (Signed)
[name], [name] ([id number]) ?Visit Report for 10/01/2021 ?Arrival Information Details ?Patient Name: [name], [name] ?Date of Service: 10/01/2021 8:15 AM ?Medical Record Number: [id number] ?Patient Account Number: [id number] ?Date of Birth/Sex: 01-20-1981 (41 y.o. M) ?Treating RN: Levora Dredge ?Primary Care Indiah Heyden: [name] Other Clinician: ?Referring Antonyo Hinderer: [name] ?Treating Elfrida Pixley/Extender: Jeri Cos ?Weeks in Treatment: 251 ?Visit Information History Since Last Visit ?Added or deleted any medications: No ?Patient Arrived: Ambulatory ?Any new allergies or adverse reactions: No ?Arrival Time: 08:09 ?Had a fall or experienced change in No ?Accompanied By: self ?activities of daily living that may affect ?Transfer Assistance: None ?risk of falls: ?Patient Requires Transmission-Based No ?Hospitalized since last visit: No ?Precautions: ?Has Dressing in Place as Prescribed: Yes ?Patient Has Alerts: Yes ?Pain Present Now: No ?Patient Alerts: Patient has reaction ?to ?silver dressings. ?Electronic Signature(s) ?Signed: 10/01/2021 4:33:56 PM By: Levora Dredge ?Entered By: Levora Dredge on 10/01/2021 08:12:42 ?KENTA, LASTER ([id number]) ?-------------------------------------------------------------------------------- ?Clinic Level of Care Assessment Details ?Patient Name: [name], [name] ?Date of Service: 10/01/2021 8:15 AM ?Medical Record Number: [id number] ?Patient Account Number: [id number] ?Date of Birth/Sex: 07-12-1980 (41 y.o. M) ?Treating RN: Levora Dredge ?Primary Care Karter Haire: [name] Other Clinician: ?Referring Kelita Wallis: [name] ?Treating Ariea Rochin/Extender: Jeri Cos ?Weeks in Treatment: 251 ?Clinic Level of Care Assessment Items ?TOOL 1 Quantity Score ?[]  - Use when EandM and Procedure is performed on INITIAL visit 0 ?ASSESSMENTS - Nursing Assessment / Reassessment ?[]  - General Physical Exam (combine w/ comprehensive assessment (listed just below) when performed on  new ?0 ?pt. evals) ?[]  - 0 ?Comprehensive Assessment (HX, ROS, Risk Assessments, Wounds Hx, etc.) ?ASSESSMENTS - Wound and Skin Assessment / Reassessment ?[]  - Dermatologic / Skin Assessment (not related to wound area) 0 ?ASSESSMENTS - Ostomy and/or Continence Assessment and Care ?[]  - Incontinence Assessment and Management 0 ?[]  - 0 ?Ostomy Care Assessment and Management (repouching, etc.) ?PROCESS - Coordination of Care ?[]  - Simple Patient / Family Education for ongoing care 0 ?[]  - 0 ?Complex (extensive) Patient / Family Education for ongoing care ?[]  - 0 ?Staff obtains Consents, Records, Test Results / Process Orders ?[]  - 0 ?Staff telephones HHA, Nursing Homes / Clarify orders / etc ?[]  - 0 ?Routine Transfer to another Facility (non-emergent condition) ?[]  - 0 ?Routine Hospital Admission (non-emergent condition) ?[]  - 0 ?New Admissions / Biomedical engineer / Ordering NPWT, Apligraf, etc. ?[]  - 0 ?Emergency Hospital Admission (emergent condition) ?PROCESS - Special Needs ?[]  - Pediatric / Minor Patient Management 0 ?[]  - 0 ?Isolation Patient Management ?[]  - 0 ?Hearing / Language / Visual special needs ?[]  - 0 ?Assessment of Community assistance (transportation, D/C planning, etc.) ?[]  - 0 ?Additional assistance / Altered mentation ?[]  - 0 ?Support Surface(s) Assessment (bed, cushion, seat, etc.) ?INTERVENTIONS - Miscellaneous ?[]  - External ear exam 0 ?[]  - 0 ?Patient Transfer (multiple staff / Civil Service fast streamer / Similar devices) ?[]  - 0 ?Simple Staple / Suture removal (25 or less) ?[]  - 0 ?Complex Staple / Suture removal (26 or more) ?[]  - 0 ?Hypo/Hyperglycemic Management (do not check if billed separately) ?[]  - 0 ?Ankle / Brachial Index (ABI) - do not check if billed separately ?Has the patient been seen at the hospital within the last three years: Yes ?Total Score: 0 ?Level Of Care: ____ ?[name], [name] ([id number]) ?Electronic Signature(s) ?Signed: 10/01/2021 4:33:56 PM By: Levora Dredge ?Entered By:  Levora Dredge on 10/01/2021 08:51:44 ?[name], [name] ([id number]) ?-------------------------------------------------------------------------------- ?Compression Therapy Details ?Patient Name: [name], [name] ?Date of Service: 10/01/2021 8:15 AM ?Medical Record Number:  [id number] ?Patient Account Number: [id number] ?Date of Birth/Sex: [birth year] (42 y.o. M) ?Treating RN: Levora Dredge ?Primary Care Janica Eldred: [name] Other Clinician: ?Referring Mckinzee Spirito: [name] ?Treating Leon Montoya/Extender: Jeri Cos ?Weeks in Treatment: 251 ?Compression Therapy Performed for Wound Assessment: Wound #1 Left,Lateral Lower Leg ?Performed By: Clinician Levora Dredge, RN ?Compression Type: Four Layer ?Pre Treatment ABI: 1.2 ?Post Procedure Diagnosis ?Same as Pre-procedure ?Electronic Signature(s) ?Signed: 10/01/2021 4:33:56 PM By: Levora Dredge ?Entered By: Levora Dredge on 10/01/2021 08:41:03 ?[name], [name] ([id number]) ?-------------------------------------------------------------------------------- ?Encounter Discharge Information Details ?Patient Name: [name], [name] ?Date of Service: 10/01/2021 8:15 AM ?Medical Record Number: [id number] ?Patient Account Number: [id number] ?Date of Birth/Sex: 08-13-1980 (41 y.o. M) ?Treating RN: Levora Dredge ?Primary Care Layla Gramm: [name] Other Clinician: ?Referring Danis Pembleton: [name] ?Treating Iniya Matzek/Extender: Jeri Cos ?Weeks in Treatment: 251 ?Encounter Discharge Information Items ?Discharge Condition: Stable ?Ambulatory Status: Ambulatory ?Discharge Destination: Home ?Transportation: Private Auto ?Accompanied By: self ?Schedule Follow-up Appointment: Yes ?Clinical Summary of Care: Patient Declined ?Electronic Signature(s) ?Signed: 10/01/2021 4:33:56 PM By: Levora Dredge ?Entered By: Levora Dredge on 10/01/2021 08:52:49 ?[name], [name] ([id number]) ?-------------------------------------------------------------------------------- ?Lower Extremity Assessment  Details ?Patient Name: [name], [name] ?Date of Service: 10/01/2021 8:15 AM ?Medical Record Number: [id number] ?Patient Account Number: [id number] ?Date of Birth/Sex: 11/01/1980 (41 y.o. M) ?Treating RN: Levora Dredge ?Primary Care Lashonta Pilling: [name] Other Clinician: ?Referring Havannah Streat: [name] ?Treating Davionna Blacksher/Extender: Jeri Cos ?Weeks in Treatment: 251 ?Edema Assessment ?Assessed: [Left: No] [Right: No] ?Edema: [Left: Ye] [Right: s] ?Calf ?Left: Right: ?Point of Measurement: 35 cm From Medial Instep 47.3 cm ?Ankle ?Left: Right: ?Point of Measurement: 10 cm From Medial Instep 29 cm ?Vascular Assessment ?Pulses: ?Dorsalis Pedis ?Palpable: [Left:Yes] ?Electronic Signature(s) ?Signed: 10/01/2021 4:33:56 PM By: Levora Dredge ?Entered By: Levora Dredge on 10/01/2021 08:23:07 ?[name], [name] ([id number]) ?-------------------------------------------------------------------------------- ?Multi Wound Chart Details ?Patient Name: [name], [name] ?Date of Service: 10/01/2021 8:15 AM ?Medical Record Number: [id number] ?Patient Account Number: [id number] ?Date of Birth/Sex: [birth year] (41 y.o. M) ?Treating RN: Levora Dredge ?Primary Care Wenonah Milo: [name] Other Clinician: ?Referring Chastidy Ranker: [name] ?Treating Cloma Rahrig/Extender: Jeri Cos ?Weeks in Treatment: 251 ?Vital Signs ?Height(in): 71 ?Pulse(bpm): 81 ?Weight(lbs): 338 ?Blood Pressure(mmHg): 137/83 ?Body Mass Index(BMI): 47.1 ?Temperature(??F): 97.7 ?Respiratory Rate(breaths/min): 18 ?Photos: [N/A:N/A] ?Wound Location: Left, Lateral Lower Leg N/A N/A ?Wounding Event: Gradually Appeared N/A N/A ?Primary Etiology: Pyoderma N/A N/A ?Comorbid History: Sleep Apnea, Hypertension, Colitis N/A N/A ?Date Acquired: 11/18/2015 N/A N/A ?Weeks of Treatment: 251 N/A N/A ?Wound Status: Open N/A N/A ?Wound Recurrence: No N/A N/A ?Measurements L x W x D (cm) 3.2x1.9x0.3 N/A N/A ?Area (cm?) : 4.775 N/A N/A ?Volume (cm?) : 1.433 N/A N/A ?% Reduction in Area:  2.70% N/A N/A ?% Reduction in Volume: 63.50% N/A N/A ?Classification: Full Thickness With Exposed N/A N/A ?Support Structures ?Exudate Amount: Medium N/A N/A ?Exudate Type: Serosanguineous N/A N/A ?Exudate Color: red,

## 2021-10-03 DIAGNOSIS — I872 Venous insufficiency (chronic) (peripheral): Secondary | ICD-10-CM | POA: Diagnosis not present

## 2021-10-03 NOTE — Progress Notes (Signed)
NOBORU, BIDINGER ([id number]) ?Visit Report for 10/03/2021 ?Arrival Information Details ?Patient Name: [name], [name] ?Date of Service: 10/03/2021 8:00 AM ?Medical Record Number: [id number] ?Patient Account Number: [id number] ?Date of Birth/Sex: 12/25/1980 (41 y.o. M) ?Treating RN: Donnamarie Poag ?Primary Care Kayshawn Ozburn: [name] Other Clinician: ?Referring Chi Garlow: [name] ?Treating Rindy Kollman/Extender: Kalman Shan ?Weeks in Treatment: 252 ?Visit Information History Since Last Visit ?Added or deleted any medications: No ?Patient Arrived: Ambulatory ?Had a fall or experienced change in No ?Arrival Time: 08:02 ?activities of daily living that may affect ?Accompanied By: self ?risk of falls: ?Transfer Assistance: None ?Hospitalized since last visit: No ?Patient Identification Verified: Yes ?Has Dressing in Place as Prescribed: Yes ?Secondary Verification Process Completed: Yes ?Has Compression in Place as Prescribed: Yes ?Patient Requires Transmission-Based No ?Pain Present Now: No ?Precautions: ?Patient Has Alerts: Yes ?Patient Alerts: Patient has reaction ?to ?silver dressings. ?Electronic Signature(s) ?Signed: 10/03/2021 1:53:21 PM By: Donnamarie Poag ?Entered ByDonnamarie Poag on 10/03/2021 08:20:44 ?STIVEN, KASPAR ([id number]) ?-------------------------------------------------------------------------------- ?Compression Therapy Details ?Patient Name: FLAVIUS, REPSHER ?Date of Service: 10/03/2021 8:00 AM ?Medical Record Number: [id number] ?Patient Account Number: [id number] ?Date of Birth/Sex: 12/29/1980 (41 y.o. M) ?Treating RN: Donnamarie Poag ?Primary Care Nayab Aten: [name] Other Clinician: ?Referring Seri Kimmer: [name] ?Treating Ellysia Char/Extender: Kalman Shan ?Weeks in Treatment: 252 ?Compression Therapy Performed for Wound Assessment: Wound #1 Left,Lateral Lower Leg ?Performed By: Clinician Donnamarie Poag, RN ?Compression Type: Four Layer ?Electronic Signature(s) ?Signed: 10/03/2021 1:53:21 PM By: Donnamarie Poag ?Entered ByDonnamarie Poag on 10/03/2021 08:28:16 ?JARIUS, [name] ([id number]) ?-------------------------------------------------------------------------------- ?Encounter Discharge Information Details ?Patient Name: MALACHY, COLEMAN ?Date of Service: 10/03/2021 8:00 AM ?Medical Record Number: [id number] ?Patient Account Number: [id number] ?Date of Birth/Sex: [birth year] (41 y.o. M) ?Treating RN: Donnamarie Poag ?Primary Care Anae Hams: [name] Other Clinician: ?Referring Kynan Peasley: [name] ?Treating Nashonda Limberg/Extender: Kalman Shan ?Weeks in Treatment: 252 ?Encounter Discharge Information Items ?Discharge Condition: Stable ?Ambulatory Status: Ambulatory ?Discharge Destination: Home ?Transportation: Private Auto ?Accompanied By: self ?Schedule Follow-up Appointment: Yes ?Clinical Summary of Care: ?Electronic Signature(s) ?Signed: 10/03/2021 1:53:21 PM By: Donnamarie Poag ?Entered ByDonnamarie Poag on 10/03/2021 08:29:44 ?KHALI, ALBANESE ([id number]) ?-------------------------------------------------------------------------------- ?Wound Assessment Details ?Patient Name: SHENANDOAH, VANDERGRIFF ?Date of Service: 10/03/2021 8:00 AM ?Medical Record Number: [id number] ?Patient Account Number: [id number] ?Date of Birth/Sex: 1980/10/23 (41 y.o. M) ?Treating RN: Donnamarie Poag ?Primary Care Kade Demicco: [name] Other Clinician: ?Referring Sascha Baugher: [name] ?Treating Hodari Chuba/Extender: Kalman Shan ?Weeks in Treatment: 252 ?Wound Status ?Wound Number: 1 ?Primary Etiology: Pyoderma ?Wound Location: Left, Lateral Lower Leg ?Wound Status: Open ?Wounding Event: Gradually Appeared ?Comorbid History: Sleep Apnea, Hypertension, Colitis ?Date Acquired: 11/18/2015 ?Weeks Of Treatment: 252 ?Clustered Wound: No ?Wound Measurements ?Length: (cm) 3.2 ?Width: (cm) 1.9 ?Depth: (cm) 0.3 ?Area: (cm?) 4.775 ?Volume: (cm?) 1.433 ?% Reduction in Area: 2.7% ?% Reduction in Volume: 63.5% ?Epithelialization: Medium (34-66%) ?Wound  Description ?Classification: Full Thickness With Exposed Support Structures ?Wound Margin: Epibole ?Exudate Amount: Medium ?Exudate Type: Serosanguineous ?Exudate Color: red, brown ?Foul Odor After Cleansing: No ?Slough/Fibrino Yes ?Wound Bed ?Granulation Amount: Large (67-100%) Exposed Structure ?Granulation Quality: Red, Pink, Hyper-granulation ?Fascia Exposed: No ?Necrotic Amount: Small (1-33%) ?Fat Layer (Subcutaneous Tissue) Exposed: Yes ?Necrotic Quality: Adherent Slough ?Tendon Exposed: No ?Muscle Exposed: No ?Joint Exposed: No ?Bone Exposed: No ?Treatment Notes ?Wound #1 (Lower Leg) Wound Laterality: Left, Lateral ?Cleanser ?Wound Cleanser ?Discharge Instruction: Wash your hands with soap and water. Remove old dressing, discard into plastic bag and place into trash. ?Cleanse the wound with Wound Cleanser prior to applying a clean dressing using gauze sponges, not  tissues or cotton balls. Do ?not scrub or use excessive force. Pat dry using gauze sponges, not tissue or cotton balls. ?Peri-Wound Care ?Desitin Maximum Strength Ointment 4 (oz) ?Discharge Instruction: Apply around wound edges to avoid maceration ?Topical ?Clobetasol Propionate ointment 0.05%, 60 (g) tube ?Discharge Instruction: Pt to bring to appt- applied to wound bed ?Primary Dressing ?Hydrofera Blue Ready Transfer Foam, 4x5 (in/in) ?Discharge Instruction: Apply Hydrofera Blue Ready to wound bed as directed ?Secondary Dressing ?(NON-BORDER) Zetuvit Plus Silicone NON-BORDER 5x5 (in/in) ?COLSEN, MODI ([id number]) ?Secured With ?Compression Wrap ?Medichoice 4 layer Compression System, 35-40 mmHG ?Discharge Instruction: Apply multi-layer wrap as directed. ?Compression Stockings ?Add-Ons ?Electronic Signature(s) ?Signed: 10/03/2021 1:53:21 PM By: Donnamarie Poag ?Entered ByDonnamarie Poag on 10/03/2021 08:26:43 ?

## 2021-10-03 NOTE — Progress Notes (Signed)
[name], [name] ([id number]) ?Visit Report for 10/03/2021 ?Physician Orders Details ?Patient Name: [name], [name] ?Date of Service: 10/03/2021 8:00 AM ?Medical Record Number: [id number] ?Patient Account Number: [id number] ?Date of Birth/Sex: [birth year] (41 y.o. M) ?Treating RN: Donnamarie Poag ?Primary Care Provider: [name] Other Clinician: ?Referring Provider: [name] ?Treating Provider/Extender: Kalman Shan ?Weeks in Treatment: 252 ?Verbal / Phone Orders: No ?Diagnosis Coding ?Follow-up Appointments ?o Return Appointment in 1 week. ?o Nurse Visit as needed - twice a week ?Bathing/ Shower/ Hygiene ?o Clean wound with Normal Saline or wound cleanser. ?o May shower with wound dressing protected with water repellent cover or cast protector. ?o No tub bath. ?Edema Control - Lymphedema / Segmental Compressive Device / Other ?o Optional: One layer of unna paste to top of compression wrap (to act as an anchor). - He needs this to hold it up ?o Elevate, Exercise Daily and Avoid Standing for Long Periods of Time. ?o Elevate legs to the level of the heart and pump ankles as often as possible ?o Elevate leg(s) parallel to the floor when sitting. ?Wound Treatment ?Wound #1 - Lower Leg Wound Laterality: Left, Lateral ?Cleanser: Wound Cleanser 3 x Per Week/30 Days ?Discharge Instructions: Wash your hands with soap and water. Remove old dressing, discard into plastic bag and place into trash. ?Cleanse the wound with Wound Cleanser prior to applying a clean dressing using gauze sponges, not tissues or cotton balls. Do not ?scrub or use excessive force. Pat dry using gauze sponges, not tissue or cotton balls. ?Peri-Wound Care: Desitin Maximum Strength Ointment 4 (oz) 3 x Per Week/30 Days ?Discharge Instructions: Apply around wound edges to avoid maceration ?Topical: Clobetasol Propionate ointment 0.05%, 60 (g) tube 3 x Per Week/30 Days ?Discharge Instructions: Pt to bring to appt- applied to wound  bed ?Primary Dressing: Hydrofera Blue Ready Transfer Foam, 4x5 (in/in) 3 x Per Week/30 Days ?Discharge Instructions: Apply Hydrofera Blue Ready to wound bed as directed ?Secondary Dressing: (NON-BORDER) Zetuvit Plus Silicone NON-BORDER 5x5 (in/in) 3 x Per Week/30 Days ?Compression Wrap: Medichoice 4 layer Compression System, 35-40 mmHG (Generic) 3 x Per Week/30 Days ?Discharge Instructions: Apply multi-layer wrap as directed. ?Electronic Signature(s) ?Signed: 10/03/2021 12:38:43 PM By: Kalman Shan DO ?Signed: 10/03/2021 1:53:21 PM By: Donnamarie Poag ?Entered ByDonnamarie Poag on 10/03/2021 08:28:40 ?[name], [name] ([id number]) ?-------------------------------------------------------------------------------- ?SuperBill Details ?Patient Name: [name], [name] ?Date of Service: 10/03/2021 ?Medical Record Number: [id number] ?Patient Account Number: [id number] ?Date of Birth/Sex: 11/30/1980 (41 y.o. 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

## 2021-10-05 DIAGNOSIS — I872 Venous insufficiency (chronic) (peripheral): Secondary | ICD-10-CM | POA: Diagnosis not present

## 2021-10-08 ENCOUNTER — Encounter: Payer: 59 | Admitting: Physician Assistant

## 2021-10-08 DIAGNOSIS — I872 Venous insufficiency (chronic) (peripheral): Secondary | ICD-10-CM | POA: Diagnosis not present

## 2021-10-08 NOTE — Progress Notes (Signed)
[name], [name] ([id number]) Visit Report for 10/05/2021 Arrival Information Details Patient Name: [name], [name] Date of Service: 10/05/2021 8:00 AM Medical Record Number: [id number] Patient Account Number: [id number] Date of Birth/Sex: 12/28/1980 (41 y.o. M) Treating RN: [name] Primary Care Aryiah Monterosso: [name] Other Clinician: Referring Oswell Say: [name] Treating Sruthi Maurer/Extender: [name] Cliche in Treatment: 30 Visit Information History Since Last Visit Added or deleted any medications: No Patient Arrived: Ambulatory Has Dressing in Place as Prescribed: Yes Arrival Time: 08:25 Has Compression in Place as Prescribed: Yes Accompanied By: self Pain Present Now: No Transfer Assistance: None Patient Identification Verified: Yes Secondary Verification Process Completed: Yes Patient Requires Transmission-Based No Precautions: Patient Has Alerts: Yes Patient Alerts: Patient has reaction to silver dressings. Electronic Signature(s) Signed: 10/08/2021 4:42:58 PM By: [name], [name], RN, [name], [name], [name] Entered By: [name], [name], RN, [name], [name] on 10/05/2021 08:25:37 [name] ([id number]) -------------------------------------------------------------------------------- Compression Therapy Details Patient Name: [name] Date of Service: 10/05/2021 8:00 AM Medical Record Number: [id number] Patient Account Number: [id number] Date of Birth/Sex: [birth year] (40 y.o. M) Treating RN: [name] Primary Care Marissah Vandemark: [name] Other Clinician: Referring Moishy Laday: [name] Treating Tonesha Tsou/Extender: [name] Cliche in Treatment: 252 Compression Therapy Performed for Wound Assessment: Wound #1 Left,Lateral Lower Leg Performed By: Clinician [name], RN Compression Type: Four Layer Pre Treatment ABI: 1.2 Electronic Signature(s) Signed: 10/08/2021 4:42:58 PM By: [name], [name], RN, [name], [name], [name] Entered By: [name], [name], RN, [name], [name] on 10/05/2021  08:26:08 [name] ([id number]) -------------------------------------------------------------------------------- Encounter Discharge Information Details Patient Name: [name] Date of Service: 10/05/2021 8:00 AM Medical Record Number: [id number] Patient Account Number: [id number] Date of Birth/Sex: 04-May-1981 (40 y.o. M) Treating RN: [name] Primary Care Trajon Rosete: [name] Other Clinician: Referring Tobey Schmelzle: [name] Treating Elloise Roark/Extender: [name] Cliche in Treatment: 252 Encounter Discharge Information Items Discharge Condition: Stable Ambulatory Status: Ambulatory Discharge Destination: Home Transportation: Private Auto Accompanied By: self Schedule Follow-up Appointment: Yes Clinical Summary of Care: Electronic Signature(s) Signed: 10/08/2021 4:42:58 PM By: [name], [name], RN, [name], [name], [name] Entered By: [name], [name], RN, [name], [name] on 10/05/2021 08:27:11 [name] ([id number]) -------------------------------------------------------------------------------- Wound Assessment Details Patient Name: [name] Date of Service: 10/05/2021 8:00 AM Medical Record Number: [id number] Patient Account Number: [id number] Date of Birth/Sex: 02/16/1981 (40 y.o. M) Treating RN: [name] Primary Care Wahid Holley: [name] Other Clinician: Referring Terriyah Westra: [name] Treating Nasser Ku/Extender: [name] Cliche in Treatment: 252 Wound Status Wound Number: 1 Primary Etiology: Pyoderma Wound Location: Left, Lateral Lower Leg Wound Status: Open Wounding Event: Gradually Appeared Date Acquired: 11/18/2015 Weeks Of Treatment: 252 Clustered Wound: No Wound Measurements Length: (cm) 3.2 Width: (cm) 1.9 Depth: (cm) 0.3 Area: (cm) 4.775 Volume: (cm) 1.433 % Reduction in Area: 2.7% % Reduction in Volume: 63.5% Wound Description Classification: Full Thickness With Exposed Support Structures Exudate Amount: Medium Exudate Type:  Serosanguineous Exudate Color: red, brown Electronic Signature(s) Signed: 10/08/2021 4:42:58 PM By: [name], [name], RN, [name], [name], [name] Entered By: [name], [name], RN, [name], [name] on 10/05/2021 08:25:47

## 2021-10-08 NOTE — Progress Notes (Addendum)
DVAUGHN, FICKLE ([id number]) Visit Report for 10/08/2021 Chief Complaint Document Details Patient Name: [name] Date of Service: 10/08/2021 8:15 AM Medical Record Number: [id number] Patient Account Number: [id number] Date of Birth/Sex: [birth year] (41 y.o. 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 M) Treating RN: [name] Primary Care Provider: [name] Other Clinician: Referring Provider: [name] Treating Provider/Extender: [name] Cliche in Treatment: 67 History of Present Illness HPI Description: 12/04/16; 41 year old man who comes into the clinic today for review of a wound on the posterior left calf. He tells me that is been there for about a year. He is not a diabetic he does smoke half a pack per day. He was seen in the ER on 11/20/16 felt to have cellulitis around the wound and was given clindamycin. An x-ray did not show osteomyelitis. The patient initially tells me that he has a milk allergy that sets off a pruritic itching rash on his lower legs which she scratches incessantly and he thinks that's what may have set up the wound. He has been using various topical antibiotics and ointments without any effect. He works in a [employer] and is on his feet all day. He does not have a prior history of  wounds however he does have the rash on both lower legs the right arm and the ventral aspect of his left arm. These are excoriations and clearly have had scratching however there are of macular looking areas on both legs including a substantial larger area on the right leg. This does not have an underlying open area. There is no blistering. The patient tells me that 2 years ago in [place] in response to the rash on his legs he saw a dermatologist who told him he had a condition which may be pyoderma gangrenosum although I may be putting words into his mouth. He seemed to recognize this. On further questioning he admits to a 5 year history of quiesced. ulcerative colitis. He is not in any treatment for this. He's had no recent travel 12/11/16; the patient arrives today with his wound and roughly the same condition we've been using silver alginate this is a deep punched out wound with some surrounding erythema but no tenderness. Biopsy I did did not show confirmed pyoderma gangrenosum suggested nonspecific inflammation and vasculitis but does not provide an actual description of what was seen by the pathologist. I'm really not able to understand this We have also received information from the patient's dermatologist in [place] notes from April 2016. This was a doctor Agarwal-antal. 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 He was prescribed topical steroid high potency under occlusion which helped but at this point the patient did not have a deep punched out wound. 12/18/16; the patient's wound is larger in terms of surface area however this surface looks better and there is less depth. The surrounding erythema also is better. The patient states that the wrap we put on came off 2 days ago when he has been using his compression stockings.  He we are in the process of getting a dermatology consult. 12/26/16 on evaluation today patient's left lower extremity wound shows evidence of infection with  surrounding erythema noted. He has been tolerating the dressing changes but states that he has noted more discomfort. There is a larger area of erythema surrounding the wound. No fevers, chills, nausea, or vomiting noted at this time. With that being said the wound still does have slough covering the surface. He is not allergic to any medication that he is aware of at this point. In regard to his right lower extremity he had several regions that are erythematous and pruritic he wonders if there's anything we can do to help that. 01/02/17 I reviewed patient's wound culture which was obtained his visit last week. He was placed on doxycycline at that point. Unfortunately that does not appear to be an antibiotic that would likely help with the situation however the pseudomonas noted on culture is sensitive to Cipro. Also unfortunately patient's wound seems to have a large compared to last week's evaluation. Not severely so but there are definitely increased measurements in general. He is continuing to have discomfort as well he writes this to be a seven out of 10. In fact he would prefer me not to perform any debridement today due to the fact that he is having discomfort and considering he has an active infection on the little reluctant to do so anyway. No fevers, chills, nausea, or vomiting noted at this time. 01/08/17; patient seems dermatology on September 5. I suspect dermatology will want the slides from the biopsy I did sent to their pathologist. I'm not sure if there is a way we can expedite that. In any case the culture I did before I left on vacation 3 weeks ago showed Pseudomonas he was given 10 days of Cipro and per her description of her intake nurses is actually somewhat better this week although the wound is quite a bit bigger than I remember the last time I saw this. He still has 3 more days of Cipro 01/21/17; dermatology appointment tomorrow. He has completed the ciprofloxacin for Pseudomonas.  Surface of the wound looks better however he is had some deterioration in the lesions on his right leg. Meantime the left lateral leg wound we will continue with sample 01/29/17; patient had his dermatology appointment but I can't yet see that note. He is completed his antibiotics. The wound is more superficial but considerably larger in circumferential area than when he came in. This is in his left lateral calf. He also has swollen erythematous areas with superficial wounds on the right leg and small papular areas on both arms. There apparently areas in her his upper thighs and buttocks I did not look at those. Dermatology biopsied the right leg. Hopefully will have their input next week. 02/05/17; patient went back to see his dermatologist who told him that he had a "scratching problem" as well as staph. He is now on a 30 day course of doxycycline and I believe she gave him triamcinolone cream to the right leg areas to help with the itching [not exactly sure but probably triamcinolone]. She apparently looked at the left lateral leg wound although this was not rebiopsied and I think felt to be ultimately part of the same pathogenesis. He is using sample border foam and changing nevus himself. He now has a new open area on the right posterior leg which was his biopsy site I don't have any of the dermatology notes  02/12/17; we put the patient in compression last week with SANTYL to the wound on the left leg and the biopsy. Edema is much better and the depth of the wound is now at level of skin. Area is still the same oBiopsy site on the right lateral leg we've also been using santyl with a border foam dressing and he is changing this himself. 02/19/17; Using silver alginate started last week to both the substantial left leg wound and the biopsy site on the right wound. He is tolerating compression well. Has a an appointment with his primary M.D. tomorrow wondering about diuretics although I'm wondering if  the edema problem is actually lymphedema 02/26/17; the patient has been to see his primary doctor Dr. Jerrel Ivory at [facility] our primary care. She started him on Lasix 20 mg and this seems to have helped with the edema. However we are not making substantial change with the left lateral calf wound and inflammation. The biopsy site on the right leg also looks stable but not really all that different. 03/12/17; the patient has been to see vein and vascular Dr. Lucky Cowboy. He has had venous reflux studies I have not reviewed these. I did get a call from his dermatology office. They felt that he might have pathergy based on their biopsy on his right leg which led them to look at the slides of EVANGELOS, PAULINO ([id number]) the biopsy I did on the left leg and they wonder whether this represents pyoderma gangrenosum which was the original supposition in a man with ulcerative colitis albeit inactive for many years. They therefore recommended clobetasol and tetracycline i.e. aggressive treatment for possible pyoderma gangrenosum. 03/26/17; apparently the patient just had reflux studies not an appointment with Dr. dew. She arrives in clinic today having applied clobetasol for 2-3 weeks. He notes over the last 2-3 days excessive drainage having to change the dressing 3-4 times a day and also expanding erythema. 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 Lillia Carmel at [facility] dermatology locally who kindly saw him at my request. 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 My concern was the degree of erythema medially extending from his left leg wound which was either inflammation from pyoderma or cellulitis. I  put him on Augmentin however culture of the wound showed Pseudomonas which is quinolone sensitive. I really don't believe he has cellulitis however in view of everything I will continue and give him a course of Cipro. He is also on doxycycline as an immune modulator for the pyoderma. In addition to his original wound on the left lateral leg with surrounding erythema he has a wound on the right posterior calf which was an original biopsy site done by dermatology. This was felt to represent pathergy from pyoderma gangrenosum 04/16/17; pyoderma gangrenosum. Saw Dr. Lillia Carmel yesterday. He has been using topical antibiotics to both wound areas his original wound on the left and the biopsies/pathergy area on the right. There is definitely some improvement in the inflammation around the wound on the right although the patient states he has increasing sensitivity of the wounds. He is on prednisone 60 and doxycycline 1 as prescribed by Dr. Lillia Carmel. He is covering the topical antibiotic with gauze  and putting this in his own compression stocks and changing this daily. He states that Dr. Lottie Rater did a culture of the left leg wound yesterday 05/07/17; pyoderma gangrenosum. The patient saw Dr. Lillia Carmel yesterday and has a follow-up with her in one month. He is still using topical antibiotics to both wounds although he can't recall exactly what type. He is still on prednisone 60 mg. Dr. Lillia Carmel stated that the doxycycline could stop if we were in agreement. 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 The right medial leg was induced by biopsy/pathergy. The area on the right is essentially healed. Still on high-dose prednisone using topical antibiotics to the wound 07/09/17; pyoderma gangrenosum with wounds on the left lateral leg. The right medial leg has closed and remains closed. He is still on  prednisone 60. oHe tells me he missed his last dermatology appointment with Dr. Lillia Carmel but will make another appointment. He reports that her blood sugar at a recent screen in [place] was high 200's. He was 180 today. 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 I think he is going to require still much longer prednisone perhaps another 3 months before attempting to taper. In the meantime his wound is a lot better. Smaller. He is cleaning this off daily and applying topical antibiotics. When he was last in the clinic I thought about changing to [facility] and actually put in a couple of calls to dermatology although probably not during their business hours. 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 He continues on oral prednisone. He has been using triple antibiotic ointment. There is surface debris and we will transition to [facility] and have him return in 2 weeks. He has lost 30 pounds since his last appointment with lifestyle modification. He may benefit from topical steroid cream for treatment this can be considered at a later date. 08/22/17 on evaluation today patient appears to actually be doing rather well in regard to his left lateral lower extremity ulcer. He has actually been managed by Dr. Dellia Nims most recently. Patient is currently on oral steroids at this time. This seems to have been of benefit for him. Nonetheless his last visit was actually with Leah on 08/06/17. Currently he is not utilizing any topical steroid creams although this could be of benefit as well. No fevers, chills, nausea, or vomiting noted at this time. 09/05/17 on evaluation today patient appears to be doing better in regard to his left lateral lower extremity ulcer. He has been tolerating the dressing changes without complication. He is using Santyl with good effect. Overall I'm very  pleased with how things are standing at this point. Patient likewise is happy that this is doing better. 09/19/17 on evaluation today patient actually appears to be doing rather well in regard to his left lateral lower extremity ulcer. Again this is secondary to Pyoderma gangrenosum and he seems to be progressing well with the Santyl which is good news. He's not having any significant pain. 10/03/17 on evaluation today patient appears to be doing excellent in regard to his lower extremity wound on the left secondary to Pyoderma gangrenosum. He has been tolerating the Santyl without complication and in general I feel like he's making good progress. 10/17/17 on evaluation today patient  appears to be doing very well in regard to his left lateral lower surety ulcer. He has been tolerating the dressing changes without complication. There does not appear to be any evidence of infection he's alternating the Santyl and the triple antibiotic ointment every other day this seems to be doing well for him. 11/03/17 on evaluation today patient appears to be doing very well in regard to his left lateral lower extremity ulcer. He is been tolerating the dressing changes without complication which is good news. Fortunately there does not appear to be any evidence of infection which is also great news. Overall is doing excellent they are starting to taper down on the prednisone is down to 40 mg at this point it also started topical clobetasol for him. 11/17/17 on evaluation today patient appears to be doing well in regard to his left lateral lower surety ulcer. He's been tolerating the dressing changes without complication. He does note that he is having no pain, no excessive drainage or discharge, and overall he feels like things are going about how he would expect and hope they would. Overall he seems to have no evidence of infection at this time in my opinion which is good news. 12/04/17-He is seen in follow-up evaluation  for right lateral lower extremity ulcer. He has been applying topical steroid cream. Today's measurement show slight increase in size. Over the next 2 weeks we will transition to every other day Santyl and steroid cream. He has been encouraged to monitor for changes and notify clinic with any concerns 12/15/17 on evaluation today patient's left lateral motion the ulcer and fortunately is doing worse again at this point. This just since last week to this week has close to doubled in size according to the patient. I did not seeing last week's I do not have a visual to compare this to in our system was also down so we do not have all the charts and at this point. Nonetheless it does have me somewhat concerned in regard to the fact that again he was worried enough about it he has contact the dermatology that placed them back on the full strength, 50 mg a day of the prednisone that he was taken previous. He continues to alternate using clobetasol along with Santyl at this point. He is obviously somewhat frustrated. 12/22/17 on evaluation today patient appears to be doing a little worse compared to last evaluation. Unfortunately the wound is a little deeper and slightly larger than the last week's evaluation. With that being said he has made some progress in regard to the irritation surrounding at this time unfortunately despite that progress that's been made he still has a significant issue going on here. 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 JACOBEY, GURA ([id number]) For that reason I discussed with him today the possibility of perform a wound culture to ensure there's nothing overtly infected. 01/06/18 on evaluation today patient's wound is larger and deeper than previously evaluated. With that being said it did appear that his wound was infected after  my last evaluation with him. Subsequently I did end up prescribing a prescription for Bactrim DS which she has been taking and having no complication with. Fortunately there does not appear to be any evidence of infection at this point in time as far as anything spreading, no want to touch, and overall I feel like  things are showing signs of improvement. 01/13/18 on evaluation today patient appears to be even a little larger and deeper than last time. There still muscle exposed in the base of the wound. Nonetheless he does appear to be less erythematous I do believe inflammation is calming down also believe the infection looks like it's probably resolved at this time based on what I'm seeing. No fevers, chills, nausea, or vomiting noted at this time. 01/30/18 on evaluation today patient actually appears to visually look better for the most part. Unfortunately those visually this looks better he does seem to potentially have what may be an abscess in the muscle that has been noted in the central portion of the wound. This is the first time that I have noted what appears to be fluctuance in the central portion of the muscle. With that being said I'm somewhat more concerned about the fact that this might indicate an abscess formation at this location. I do believe that an ultrasound would be appropriate. This is likely something we need to try to do as soon as possible. He has been switch to mupirocin ointment and he is no longer using the steroid ointment as prescribed by dermatology he sees them again next week he's been decreased from 60 to 40 mg of prednisone. 03/09/18 on evaluation today patient actually appears to be doing a little better compared to last time I saw him. There's not as much erythema surrounding the wound itself. He I did review his most recent infectious disease note which was dated 02/24/18. He saw Dr. Michel Bickers in [place]. 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 Patient is now off of antibiotics and they are continually observing this. There seems to be no change in the past two weeks in my pinion based on what the patient says and what I see today compared to what Dr. Megan Salon likely saw two weeks ago. No fevers, chills, nausea, or vomiting noted at this time. 03/23/18 on evaluation today patient's wound actually appears to be showing signs of improvement which is good news. He is currently still on the Dapsone. He is also working on tapering the prednisone to get off of this and Dr. Lottie Rater is working with him in this regard. Nonetheless overall I feel like the wound is doing well it does appear based on the infectious disease note that I reviewed from Dr. 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 I did also review the results of his ultrasound of left lower extremity which revealed there was a dentist tissue in the base of the wound without an abscess noted. 04/06/18 on evaluation today the patient's left lateral lower extremity ulcer actually appears to be doing fairly well which is excellent news. There does not appear to be any evidence of infection at this time which is also great news. Overall he still does have a significantly large ulceration although little by little he seems to be making progress. He is down to 10 mg a day of the prednisone. 04/20/18 on evaluation today patient actually appears to be doing excellent at this time in regard to his left lower extremity ulcer. He's making signs of good progress unfortunately this is taking much longer than we would really like to see but nonetheless he is  making progress. Fortunately there does not appear to be any evidence of infection at this time. No fevers, chills, nausea, or vomiting noted at this time.  The patient has not been using the Santyl due to the cost he hadn't got in this field yet. He's mainly been using the antibiotic ointment topically. Subsequently he also tells me that he really has not been scrubbing in the shower I think this would be helpful again as I told him it doesn't have to be anything too aggressive to even make it believe just enough to keep it free of some of the loose slough and biofilm on the wound surface. 05/11/18 on evaluation today patient's wound appears to be making slow but sure progress in regard to the left lateral lower extremity ulcer. He is been tolerating the dressing changes without complication. Fortunately there does not appear to be any evidence of infection at this time. He is still just using triple antibiotic ointment along with clobetasol occasionally over the area. He never got the Santyl and really does not seem to intend to in my pinion. 06/01/18 on evaluation today patient appears to be doing a little better in regard to his left lateral lower extremity ulcer. He states that overall he does not feel like he is doing as well with the Dapsone as he did with the prednisone. Nonetheless he sees his dermatologist later today and is gonna talk to them about the possibility of going back on the prednisone. Overall again I believe that the wound would be better if you would utilize Santyl but he really does not seem to be interested in going back to the [place] at this point. He has been using triple antibiotic ointment. 06/15/18 on evaluation today patient's wound actually appears to be doing about the same at this point. Fortunately there is no signs of infection at this time. He has made slight improvements although he continues to not really want to clean the wound bed at this point. He states that he just doesn't mess with it he doesn't want to cause any problems with everything else he has going on. He has been on medication, antibiotics as prescribed  by his dermatologist, for a staff infection of his lower extremities which is really drying out now and looking much better he tells me. Fortunately there is no sign of overall infection. 06/29/18 on evaluation today patient appears to be doing well in regard to his left lateral lower surety ulcer all things considering. Fortunately his staff infection seems to be greatly improved compared to previous. He has no signs of infection and this is drying up quite nicely. He is still the doxycycline for this is no longer on cental, Dapsone, or any of the other medications. His dermatologist has recommended possibility of an infusion but right now he does not want to proceed with that. 07/13/18 on evaluation today patient appears to be doing about the same in regard to his left lateral lower surety ulcer. Fortunately there's no signs of infection at this time which is great news. Unfortunately he still builds up a significant amount of Slough/biofilm of the surface of the wound he still is not really cleaning this as he should be appropriately. Again I'm able to easily with saline and gauze remove the majority of this on the surface which if you would do this at home would likely be a dramatic improvement for him as far as getting the area to improve. Nonetheless overall I still feel like he  is making progress is just very slow. I think Santyl will be of benefit for him as well. Still he has not gotten this as of this point. 07/27/18 on evaluation today patient actually appears to be doing little worse in regards of the erythema around the periwound region of the wound he also tells me that he's been having more drainage currently compared to what he was experiencing last time I saw him. He states not quite as bad as what he had because this was infected previously but nonetheless is still appears to be doing poorly. Fortunately there is no evidence of systemic infection at this point. The patient tells me that  he is not going to be able to afford the Santyl. He is still waiting to hear about the infusion therapy with his dermatologist. Apparently she wants an updated colonoscopy first. 08/10/18 on evaluation today patient appears to be doing better in regard to his left lateral lower extremity ulcer. Fortunately he is showing signs of improvement in this regard he's actually been approved for Remicade infusion's as well although this has not been scheduled as of yet. Fortunately there's no signs of active infection at this time in regard to the wound although he is having some issues with infection of the right lower extremity is been seen as dermatologist for this. Fortunately they are definitely still working with him trying to keep things under control. TAAVI, HOOSE ([id number]) 09/07/18 on evaluation today patient is actually doing rather well in regard to his left lateral lower extremity ulcer. He notes these actually having some hair grow back on his extremity which is something he has not seen in years. He also tells me that the pain is really not giving them any trouble at this time which is also good news overall she is very pleased with the progress he's using a combination of the mupirocin along with the probate is all mixed. 09/21/18 on evaluation today patient actually appears to be doing fairly well all things considered in regard to his looks from the ulcer. He's been tolerating the dressing changes without complication. Fortunately there's no signs of active infection at this time which is good news he is still on all antibiotics or prevention of the staff infection. He has been on prednisone for time although he states it is gonna contact his dermatologist and see if she put them on a short course due to some irritation that he has going on currently. Fortunately there's no evidence of any overall worsening this is going very slow I think cental would be something that would be helpful for him  although he states that $50 for tube is quite expensive. He therefore is not willing to get that at this point. 10/06/18 on evaluation today patient actually appears to be doing decently well in regard to his left lateral leg ulcer. He's been tolerating the dressing changes without complication. Fortunately there's no signs of active infection at this time. Overall I'm actually rather pleased with the progress he's making although it's slow he doesn't show any signs of infection and he does seem to be making some improvement. I do believe that he may need a switch up and dressings to try to help this to heal more appropriately and quickly. 10/19/18 on evaluation today patient actually appears to be doing better in regard to his left lateral lower extremity ulcer. This is shown signs of having much less Slough buildup at this point due to the fact he has been using  the Santyl. Obviously this is very good news. The overall size of the wound is not dramatically smaller but again the appearance is. 11/02/18 on evaluation today patient actually appears to be doing quite well in regard to his lower [place] ulcer. A lot of the skin around the ulcer is actually somewhat irritating at this point this seems to be more due to the dressing causing irritation from the adhesive that anything else. Fortunately there is no signs of active infection at this time. 11/24/18 on evaluation today patient appears to be doing a little worse in regard to his overall appearance of his lower extremity ulcer. There's more erythema and warmth around the wound unfortunately. He is currently on doxycycline which he has been on for some time. With that being said I'm not sure that seems to be helping with what appears to possibly be an acute cellulitis with regard to his left lower extremity ulcer. No fevers, chills, nausea, or vomiting noted at this time. 12/08/18 on evaluation today patient's wounds actually appears to be doing  significantly better compared to his last evaluation. He has been using Santyl along with alternating tripling about appointment as well as the steroid cream seems to be doing quite well and the wound is showing signs of improvement which is excellent news. Fortunately there's no evidence of infection and in fact his culture came back negative with only normal skin flora noted. 12/21/2018 upon evaluation today patient actually appears to be doing excellent with regard to his ulcer. This is actually the best that I have seen it since have been helping to take care of him. It is both smaller as well as less slough noted on the surface of the wound and seems to be showing signs of good improvement with new skin growing from the edges. He has been using just the triamcinolone he does wonder if he can get a refill of that ointment today. 01/04/2019 upon evaluation today patient actually appears to be doing well with regard to his left lateral lower extremity ulcer. With that being said it does not appear to be that he is doing quite as well as last time as far as progression is concerned. There does not appear to be any signs of infection or significant irritation which is good news. With that being said I do believe that he may benefit from switching to a collagen based dressing based on how clean The wound appears. 01/18/2019 on evaluation today patient actually appears to be doing well with regard to his wound on the left lower extremity. He is not made a lot of progress compared to where we were previous but nonetheless does seem to be doing okay at this time which is good news. There is no signs of active infection which is also good news. My only concern currently is I do wish we can get him into utilizing the collagen dressing his insurance would not pay for the supplies that we ordered although it appears that he may be able to order this through his supply company that he typically utilizes. This is  [employer]. Nonetheless he did try to order it during the office visit today and it appears this did go through. We will see if he can get that it is a different brand but nonetheless he has collagen and I do think will be beneficial. 02/01/2019 on evaluation today patient actually appears to be doing a little worse today in regard to the overall size of his wounds. Fortunately there  is no signs of active infection at this time. That is visually. Nonetheless when this is happened before it was due to infection. For that reason were somewhat concerned about that this time as well. 02/08/2019 on evaluation today patient unfortunately appears to be doing slightly worse with regard to his wound upon evaluation today. Is measuring a little deeper and a little larger unfortunately. I am not really sure exactly what is causing this to enlarge he actually did see his dermatologist she is going to see about initiating Humira for him. Subsequently she also did do steroid injections into the wound itself in the periphery. Nonetheless still nonetheless he seems to be getting a little bit larger he is gone back to just using the steroid cream topically which I think is appropriate. I would say hold off on the collagen for the time being is definitely a good thing to do. Based on the culture results which we finally did get the final result back regarding it shows staph as the bacteria noted again that can be a normal skin bacteria based on the fact however he is having increased drainage and worsening of the wound measurement wise I would go ahead and place him on an antibiotic today I do believe for this. 02/15/2019 on evaluation today patient actually appears to be doing somewhat better in regard to his ulcer. There is no signs of worsening at this time I did review his culture results which showed evidence of Staphylococcus aureus but not MRSA. Again this could just be more related to the normal skin bacteria  although he states the drainage has slowed down quite a bit he may have had a mild infection not just colonization. And was much smaller and then since around10/04/2019 on evaluation today patient appears to be doing unfortunately worse as far as the size of the wound. I really feel like that this is steadily getting larger again it had been doing excellent right at the beginning of September we have seen a steady increase in the area of the wound it is almost 2-1/2 times the size it was on September 1. Obviously this is a bad trend this is not wanting to see. For that reason we went back to using just the topical triamcinolone cream which does seem to help with inflammation. I checked him for bacteria by way of culture and nothing showed positive there. I am considering giving him a short course of a tapering steroid Dosepak today to see if that is can be beneficial for him. The patient is in agreement with giving that a try. 03/08/2019 on evaluation today patient appears to be doing very well in comparison to last evaluation with regard to his lower extremity ulcer. This is showing signs of less inflammation and actually measuring slightly smaller compared to last time every other week over the past month and a half he has been measuring larger larger larger. Nonetheless I do believe that the issue has been inflammation the prednisone does seem to [facility], [facility] ([id number]) have been beneficial for him which is good news. No fevers, chills, nausea, vomiting, or diarrhea. 03/22/2019 on evaluation today patient appears to be doing about the same with regard to his leg ulcer. He has been tolerating the dressing changes without complication. With that being said the wound seems to be mostly arrested at its current size but really is not making any progress except for when we prescribed the prednisone. He did show some signs of dropping as far as  the overall size of the wound during that interval week.  Nonetheless this is something he is not on long-term at this point and unfortunately I think he is getting need either this or else the Humira which his dermatologist has discussed try to get approval for. With that being said he will be seeing his dermatologist on the 11th of this month that is November. 04/19/2019 on evaluation today patient appears to be doing really about the same the wound is measuring slightly larger compared to last time I saw him. He has not been into the office since November 2 due to the fact that he unfortunately had Covid as that his entire family. He tells me that it was rough but they did pull-through and he seems to be doing much better. Fortunately there is no signs of active infection at this time. No fevers, chills, nausea, vomiting, or diarrhea. 05/10/2019 on evaluation today patient unfortunately appears to be doing significantly worse as compared to last time I saw him. He does tell me that he has had his first dose of Humira and actually is scheduled to get the next one in the upcoming week. With that being said he tells me also that in the past several days he has been having a lot of issues with green drainage she showed me a picture this is more blue-green in color. He is also been having issues with increased sloughy buildup and the wound does appear to be larger today. Obviously this is not the direction that we want everything to take based on the starting of his Humira. Nonetheless I think this is definitely a result of likely infection and to be honest I think this is probably Pseudomonas causing the infection based on what I am seeing. 05/24/2019 on evaluation today patient unfortunately appears to be doing significantly worse compared to his prior evaluation with me 2 weeks ago. I did review his culture results which showed that he does have Staph aureus as well as Pseudomonas noted on the culture. Nonetheless the Levaquin that I prescribed for him does  not appear to have been appropriate and in fact he tells me he is no longer experiencing the green drainage and discharge that he had at the last visit. Fortunately there is no signs of active infection at this time which is good news although the wound has significantly worsened it in fact is much deeper than it was previous. We have been utilizing up to this point triamcinolone ointment as the prescription topical of choice but at this time I really feel like that the wound is getting need to be packed in order to appropriately manage this due to the deeper nature of the wound. Therefore something along the lines of an alginate dressing may be more appropriate. 05/31/2019 upon inspection today patient's wound actually showed signs of doing poorly at this point. Unfortunately he just does not seem to be making any good progress despite what we have tried. He actually did go ahead and pick up the Cipro and start taking that as he was noticing more green drainage he had previously completed the Levaquin that I prescribed for him as well. Nonetheless he missed his appointment for the seventh last week on Wednesday with the wound care center and [facility] where his dermatologist referred him. Obviously I do think a second opinion would be helpful at this point especially in light of the fact that the patient seems to be doing so poorly despite the fact  that we have tried everything that I really know how at this point. The only thing that ever seems to have helped him in the past is when he was on high doses of continual steroids that did seem to make a difference for him. 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 06/07/2019 upon evaluation today patient unfortunately appears to be doing worse yet again with regard to his wound. In fact I am  starting to question whether or not he may have a fluid pocket in the muscle at this point based on the bulging and the soft appearance to the central portion of the muscle area. There is not anything draining from the muscle itself at this time which is good news but nonetheless the wound is expanding. I am not really seeing any results of the Humira as far as overall wound progression based on what I am seeing at this point. The patient has been referred for second opinion with regard to his wound to the [facility] wound care center by his dermatologist which I definitely am not in opposition to. Unfortunately we tried multiple dressings in the past including collagen, alginate, and at one point even Hydrofera Blue. With that being said he is never really used it for any significant amount of time due to the fact that he often complains of pain associated with these dressings and then will go back to either using the Santyl which she has done intermittently or more frequently the triamcinolone. He is also using his own compression stockings. We have wrapped him in the past but again that was something else that he really was not a big fan of. 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 He seemed to do much better when he was on the steroids although honestly I understand that the steroids are not necessarily the best medication to be on long-term obviously 06/14/2019 on evaluation today patient appears to be doing actually a little bit better with regard to the overall appearance with his leg. Unfortunately he does continue to have issues with what appears to be some fluid underneath the  muscle although he did see the wound specialty center at [facility] last week their main goals were to see about infusion therapy in place of the Humira as they feel like that is not quite strong enough. They also recommended that we continue with the treatment otherwise as we are they felt like that was appropriate and they are okay with him continuing to follow-up here with [place] in that regard. With that being said they are also sending him to the vein specialist there to see about vein stripping and if that would be of benefit for him. Subsequently they also did not really address whether or not an ultrasound of the muscle area to see if there is anything that needs to be addressed here would be appropriate or not. For that reason I discussed this with him last week I think we  may proceed down that road at this point. 06/21/2019 upon evaluation today patient's wound actually appears to be doing slightly better compared to previous evaluations. I do believe that he has made a difference with regard to the progression here with the use of oral steroids. Again in the past has been the only thing that is really calm things down. He does tell me that from [facility] is gotten a good news from there that there are no further vein stripping that is necessary at this point. I do not have that available for review today although the patient did relay this to me. He also did obtain and have the ultrasound of the wound completed which I did sign off on today. It does appear that there is no fluid collection under the muscle this is likely then just edematous tissue in general. That is also good news. Overall I still believe the inflammation is the main issue here. He did inquire about the possibility of a wound VAC again with the muscle protruding like it is I am not really sure whether the wound VAC is necessarily ideal or not. That is something we will have to consider although I do believe he may need compression wrapping to  try to help with edema control which could potentially be of benefit. 06/28/2019 on evaluation today patient appears to be doing slightly better measurement wise although this is not terribly smaller he least seems to be trending towards that direction. With that being said he still seems to have purulent drainage noted in the wound bed at this time. He has been on Levaquin followed by Cipro over the past month. Unfortunately he still seems to have some issues with active infection at this time. I did perform a culture last week in order to evaluate and see if indeed there was still anything going on. Subsequently the culture did come back showing Pseudomonas which is consistent with the drainage has been having which is blue-green in color. He also has had an odor that again was somewhat consistent with Pseudomonas as well. Long story short it appears that the culture showed an intermediate finding with regard to how well the Cipro will work for the Pseudomonas infection. Subsequently being that he does not seem to be clearing up and at best what we are doing is just keeping this at [place] I think he may need to see infectious disease to discuss IV antibiotic options. REYNALDO, ROSSMAN ([id number]) 07/05/2019 upon evaluation today patient appears to be doing okay in regard to his leg ulcer. He has been tolerating the dressing changes at this point without complication. Fortunately there is no signs of active infection at this time which is good news. No fevers, chills, nausea, vomiting, or diarrhea. With that being said he does have an appointment with infectious disease tomorrow and his primary care on Wednesday. Again the reason for the infectious disease referral was due to the fact that he did not seem to be fully resolving with the use of oral antibiotics and therefore we were thinking that IV antibiotic therapy may be necessary secondary to the fact that there was an intermediate finding for  how effective the Cipro may be. Nonetheless again he has been having a lot of purulent and even green drainage. Fortunately right now that seems to have calmed down over the past week with the reinitiation of the oral antibiotic. Nonetheless we will see what Dr. Megan Salon has to say. 07/12/2019 upon evaluation today patient appears to be  doing about the same at this point in regard to his left lower extremity ulcer. Fortunately there is no signs of active infection at this time which is good news I do believe the Levaquin has been beneficial I did review Dr. Hale Bogus note and to be honest I agree that the patient's leg does appear to be doing better currently. What we found in the past as he does not seem to really completely resolve he will stop the antibiotic and then subsequently things will revert back to having issues with blue-green drainage, increased pain, and overall worsening in general. Obviously that is the reason I sent him back to infectious disease. 07/19/2019 upon evaluation today patient appears to be doing roughly the same in size there is really no dramatic improvement. He has started back on the Levaquin at this point and though he seems to be doing okay he did still have a lot of blue/green drainage noted on evaluation today unfortunately. I think that this is still indicative more likely of a Pseudomonas infection as previously noted and again he does see Dr. Megan Salon in just a couple of days. I do not know that were really able to effectively clear this with just oral antibiotics alone based on what I am seeing currently. Nonetheless we are still continue to try to manage as best we can with regard to the patient and his wound. I do think the wrap was helpful in decreasing the edema which is excellent news. No fevers, chills, nausea, vomiting, or diarrhea. 07/26/2019 upon evaluation today patient appears to be doing slightly better with regard to the overall appearance of the muscle  there is no dark discoloration centrally. Fortunately there is no signs of active infection at this time. No fevers, chills, nausea, vomiting, or diarrhea. Patient's wound bed currently the patient did have an appointment with Dr. Megan Salon at infectious disease last week. With that being said Dr. Megan Salon the patient states was still somewhat hesitant about put him on any IV antibiotics he wanted [place] to repeat cultures today and then see where things go going forward. He does look like Dr. Megan Salon because of some improvement the patient did have with the Levaquin wanted [place] to see about repeating cultures. If it indeed grows the Pseudomonas again then he recommended a possibility of considering a PICC line placement and IV antibiotic therapy. He plans to see the patient back in 1 to 2 weeks. 08/02/2019 upon evaluation today patient appears to be doing poorly with regard to his left lower extremity. We did get the results of his culture back it shows that he is still showing evidence of Pseudomonas which is consistent with the purulent/blue-green drainage that he has currently. Subsequently the culture also shows that he now is showing resistance to the oral fluoroquinolones which is unfortunate as that was really the only thing to treat the infection prior. I do believe that he is looking like this is going require IV antibiotic therapy to get this under control. Fortunately there is no signs of systemic infection at this time which is good news. The patient does see Dr. Megan Salon tomorrow. 08/09/2019 upon evaluation today patient appears to be doing better with regard to his left lower extremity ulcer in regard to the overall appearance. He is currently on IV antibiotic therapy. As ordered by Dr. Megan Salon. Currently the patient is on ceftazidime which she is going to take for the next 2 weeks and then follow-up for 4 to 5-week appointment with Dr. Megan Salon.  The patient started this this past Friday  symptoms have not for a total of 3 days currently in full. 08/16/2019 upon evaluation today patient's wound actually does show muscle in the base of the wound but in general does appear to be much better as far as the overall evidence of infection is concerned. In fact I feel like this is for the most part cleared up he still on the IV antibiotics he has not completed the full course yet but I think he is doing much better which is excellent news. 08/23/2019 upon evaluation today patient appears to be doing about the same with regard to his wound at this point. He tells me that he still has pain unfortunately. Fortunately there is no evidence of systemic infection at this time which is great news. There is significant muscle protrusion. 09/13/19 upon evaluation today patient appears to be doing about the same in regard to his leg unfortunately. He still has a lot of drainage coming from the ulceration there is still muscle exposed. With that being said the patient's last wound culture still showed an intermediate finding with regard to the Pseudomonas he still having the bluish/green drainage as well. Overall I do not know that the wound has completely cleared of infection at this point. Fortunately there is no signs of active infection systemically at this point which is good news. 09/20/2019 upon evaluation today patient's wound actually appears to be doing about the same based on what I am seeing currently. I do not see any signs of systemic infection he still does have evidence of some local infection and drainage. He did see Dr. Megan Salon last week and Dr. Megan Salon states that he probably does need a different IV antibiotic although he does not want to put him on this until the patient begins the Remicade infusion which is actually scheduled for about 10 days out from today on 13 May. Following that time Dr. Megan Salon is good to see him back and then will evaluate the feasibility of starting him on the  IV antibiotic therapy once again at that point. I do not disagree with this plan I do believe as Dr. Megan Salon stated in his note that I reviewed today that the patient's issue is multifactorial with the pyoderma being 1 aspect of this that were hoping the Remicade will be helpful for her. In the meantime I think the gentamicin is, helping to keep things under decent okay control in regard to the ulcer. 09/27/2019 upon evaluation today patient appears to be doing about the same with regard to his wound still there is a lot of muscle exposure though he does have some hyper granulation tissue noted around the edge and actually some granulation tissue starting to form over the muscle which is actually good news. Fortunately there is no evidence of active infection which is also good news. His pain is less at this point. 5/21; this is a patient I have not seen in a long time. 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 He has a large wound on the left lateral leg with protruding muscle. He comes in the clinic today showing the same area on his left medial ankle. He says there is been a spot there for some time although we have not previously defined this. Today he has a clearly defined area with slight amount of skin breakdown surrounded by raised areas with a purplish hue in color. This is not painful he says it is irritated.  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 He still has muscle protruding from the lateral portion of his left leg but fortunately the new area that they were concerned about at his last visit does not appear to have opened at this point. He is currently on Remicade infusions and seems to be doing better in my opinion in fact the wound itself seems to be overall much better. The purplish discoloration that he did have seems to have resolved  and I think that is a good sign that hopefully the Remicade is doing its job. He does have some biofilm noted over the surface of the wound. 11/01/2019 on evaluation today patient's wound actually appears to be doing excellent at this time. Fortunately there is no evidence of active infection and overall I feel like he is making great progress. The Remicade seems to be due excellent job in my opinion. JERYL, UMHOLTZ ([id number]) 11/08/19 evaluation today vision actually appears to be doing quite well with regard to his weight ulcer. He's been tolerating dressing changes without complication. Fortunately there is no evidence of infection. No fevers, chills, nausea, or vomiting noted at this time. Overall states that is having more itching than pain which is actually a good sign in my opinion. 12/13/2019 upon evaluation today patient appears to be doing well today with regard to his wound. He has been tolerating the dressing changes without complication. Fortunately there is no sign of active infection at this time. No fevers, chills, nausea, vomiting, or diarrhea. Overall I feel like the infusion therapy has been very beneficial for him. 01/06/2020 on evaluation today patient appears to be doing well with regard to his wound. This is measuring smaller and actually looks to be doing better. Fortunately there is no signs of active infection at this point. No fevers, chills, nausea, vomiting, or diarrhea. With that being said he does still have the blue-green drainage but this does not seem to be causing any significant issues currently. He has been using the gentamicin that does seem to be keeping things under decent control at this point. He goes later this morning for his next infusion therapy for the pyoderma which seems to also be very beneficial. 02/07/2020 on evaluation today patient appears to be doing about the same in regard to his wounds currently. Fortunately there is no signs of active infection  systemically he does still have evidence of local infection still using gentamicin. He also is showing some signs of improvement albeit slowly I do feel like we are making some progress here. 02/21/2020 upon evaluation today patient appears to be making some signs of improvement the wound is measuring a little bit smaller which is great news and overall I am very pleased with where he stands currently. He is going to be having infusion therapy treatment on the 15th of this month. Fortunately there is no signs of active infection at this time. 03/13/2020 I do believe patient's wound is actually showing some signs of improvement here which is great news. He has continue with the infusion therapy through rheumatology/dermatology at [facility] That does seem to be beneficial. I still think he gets as much benefit from this as he did from the prednisone initially but nonetheless obviously this is less harsh on his body that the prednisone as far as they are concerned. 03/31/2020 on evaluation today patient's wound actually showing signs of some pretty good improvement in regard  to the overall appearance of the wound bed. There is still muscle exposed though he does have some epithelial growth around the edges of the wound. Fortunately there is no signs of active infection at this time. No fevers, chills, nausea, vomiting, or diarrhea. 04/24/2020 upon evaluation today patient appears to be doing about the same in regard to his leg ulcer. He has been tolerating the dressing changes without complication. Fortunately there is no signs of active infection at this time. No fevers, chills, nausea, vomiting, or diarrhea. With that being said he still has a lot of irritation from the bandaging around the edges of the wound. We did discuss today the possibility of a referral to plastic surgery. 05/22/2020 on evaluation today patient appears to be doing well with regard to his wounds all things considered. He has not been able  to get the Chantix apparently there is a recall nurse that I was unaware of put out by [employer] involuntarily. Nonetheless for now I am and I have to do some research into what may be the best option for him to help with quitting in regard to smoking and we discussed that today. 06/26/2020 upon evaluation today patient appears to be doing well with regard to his wound from the standpoint of infection I do not see any signs of infection at this point. With that being said unfortunately he is still continuing to have issues with muscle exposure and again he is not having a whole lot of new skin growth unfortunately. There does not appear to be any signs of active infection at this time. No fevers, chills, nausea, vomiting, or diarrhea. 07/10/2020 upon evaluation today patient appears to be doing a little bit more poorly currently compared to where he was previous. I am concerned currently about an active infection that may be getting worse especially in light of the increased size and tenderness of the wound bed. No fevers, chills, nausea, vomiting, or diarrhea. 07/24/2020 upon evaluation today patient appears to be doing poorly in regard to his leg ulcer. He has been tolerating the dressing changes without complication but unfortunately is having a lot of discomfort. 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 Subsequently I think he is going require possibly IV antibiotics to get this under control. I am very concerned about the severity of his infection and the amount of discomfort he is having. 07/31/2020 upon evaluation today patient appears to be doing about the same in regard to his leg wound. He did see Dr. Megan Salon and Dr. Megan Salon is actually going to start him on IV antibiotics. He goes for the PICC line tomorrow. With that being said there do not have that run for 2 weeks and then see how things are doing and depending on how he  is progressing they may extend that a little longer. Nonetheless I am glad this is getting ready to be in place and definitely feel it may help the patient. In the meantime is been using mainly triamcinolone to the wound bed has an anti-inflammatory. 08/07/2020 on evaluation today patient appears to be doing well with regard to his wound compared even last week. In the interim he has gotten the PICC line placed and overall this seems to be doing excellent. There does not appear to be any evidence of infection which is great news systemically although locally of course has had the infection this appears to be improving with the use of the antibiotics. 08/14/2020 upon  evaluation today patient's wound actually showing signs of excellent improvement. Overall the irritation has significantly improved the drainage is back down to more of a normal level and his pain is really pretty much nonexistent compared to what it was. Obviously I think that this is significantly improved secondary to the IV antibiotic therapy which has made all the difference in the world. Again he had a resistant form of Pseudomonas for which oral antibiotics just was not cutting it. Nonetheless I do think that still we need to consider the possibility of a surgical closure for this wound is been open so long and to be honest with muscle exposed I think this can be very hard to get this to close outside of this although definitely were still working to try to do what we can in that regard. 08/21/2020 upon evaluation today patient appears to be doing very well with regard to his wounds on the left lateral lower extremity/calf area. Fortunately there does not appear to be signs of active infection which is great news and overall very pleased with where things stand today. He is actually wrapping up his treatment with IV antibiotics tomorrow. After that we will see where things go from there. 08/28/2020 upon evaluation today patient appears  to be doing decently well with regard to his leg ulcer. There does not appear to be any signs of active infection which is great news and overall very pleased with where things stand today. No fevers, chills, nausea, vomiting, or diarrhea. 09/18/2020 upon evaluation today patient appears to be doing well with regard to his infection which I feel like is better. Unfortunately he is not doing as well with regard to the overall size of the wound which is not nearly as good at this point. I feel like that he may be having an issue here with the pyoderma being somewhat out of control. I think that he may benefit from potentially going back and talking to the dermatologist about ABIMELEC, GROCHOWSKI ([id number]) what to do from the pyoderma standpoint. I am not certain if the infusions are helping nearly as much is what the prednisone did in the past. 10/02/2020 upon evaluation today patient appears to be doing well with regard to his leg ulcer. He did go to the [facility]iatric nurse. Unfortunately they feel like there is a 10% chance that most that he would be able to heal and that the skin graft would take. Obviously this has led him to not be able to go down that path as far as treatment is concerned. Nonetheless he does seem to be doing a little bit better with the prednisone that I gave him last time. I think that he may need to discuss with dermatology the possibility of long-term prednisone as that seems to be what is most helpful for him to be perfectly honest. I am not sure the Remicade is really doing the job. 10/17/2020 upon evaluation today patient appears to be doing a little better in regard to his wound. In fact the case has been since we did the prednisone on May 2 for him that we have noticed a little bit of improvement each time we have seen a size wise as well as appearance wise as well as pain wise. I think the prednisone has had a greater effect then the infusion therapy has to be perfectly honest. With  that being said the patient also feels significantly better compared to what he was previous. All of this is good news but  nonetheless I am still concerned about the fact that again we are really not set up to long-term manage him as far as prednisone is concerned. Obviously there are things that you need to be watched I completely understand the risk of prednisone usage as well. 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 With all that being said I do believe that we can give him another round of the prednisone which she is requesting today because of the improvement that he seen since we did that first round. 10/30/2020 upon evaluation today patient's wound actually is showing signs of doing quite well. There does not appear to be any evidence of infection which is great news and overall very pleased with where things stand today. No fevers, chills, nausea, vomiting, or diarrhea. He tells me that the prednisone still has seem to have helped he wonders if we can extend that for just a little bit longer. He did not have the appointment with a dermatologist although he did have an infusion appointment last Friday. That was at [facility]. With that being said he tells me he could not do both that as well as the appointment with the physician on the same day therefore that is can have to be rescheduled. I really want to see if there is anything they feel like that could be done differently to try to help this out as I am not really certain that the infusions are helping significantly here. 11/13/2020 upon evaluation today patient unfortunately appears to be doing somewhat poorly in regard to his wound I feel like this is actually worsening from the standpoint of the pyoderma spreading. I still feel like that he may need something different as far as trying to manage this going forward. Again we did the prednisone unfortunately his blood sugars are not doing so well  because of this. Nonetheless I believe that the patient likely needs to try topical steroid. We have done triamcinolone for a while I think going with something stronger such as clobetasol could be beneficial again this is not something I do lightly I discussed this with the patient that again this does not normally put underneath an occlusive dressing. Nonetheless I think a thin film as such could help with some of the stronger anti-inflammatory effects. We discussed this today. He would like to try to give this a trial for the next couple weeks. I definitely think that is something that we can do. Evaluate7/03/2021 and today patient's wound bed actually showed signs of doing really about the same. There was a little expansion of the size of the wound and that leading edge that we done looking out although the clobetasol does seem to have slowed this down a bit in my opinion. There is just 1 small area that still seems to be progressing based on what I see. Nonetheless I am concerned about the fact this does not seem to be improving if anything seems to be doing a little bit worse. I do not know that the infusions are really helping him much as next infusion is August 5 his appointment with dermatology is July 25. Either way I really think that we need to have a conversation potentially about this and I am actually going to see if I can talk with Dr. Lillia Carmel in order to see where things stand as well. 12/11/2020 upon evaluation today patient appears to be doing worse in regard to his leg ulcer. Unfortunately  I just do not think this is making the progress that I would like to see at this point. Honestly he does have an appointment with dermatology and this is in 2 days. I am wondering what they may have to offer to help with this. Right now what I am seeing is that he is continuing to show signs of worsening little by little. Obviously that is not great at all. Is the exact opposite of what we are  looking for. 12/18/2020 upon evaluation today patient appears to be doing a little better in regard to his wound. The dermatologist actually did do some steroid injections into the wound which does seem to have been beneficial in my opinion. That was on the 25th already this looks a little better to me than last time I saw him. With that being said we did do a culture and this did show that he has Staph aureus noted in abundance in the wound. With that being said I do think that getting him on an oral antibiotic would be appropriate as well. Also think we can compression wrap and this will make a difference as well. 12/28/2020 upon evaluation today patient's wound is actually showing signs of doing much better. I do believe the compression wrap is helping he has a lot of drainage but to be honest I think that the compression is helping to some degree in this regard as well as not draining through which is also good news. No fevers, chills, nausea, vomiting, or diarrhea. 01/04/2021 upon evaluation today patient appears to be doing well with regard to his wound. Overall things seem to be doing quite well. He did have a little bit of reaction to the CarboFlex Sorbact he will be using that any longer. With that being said he is controlled as far as the drainage is concerned overall and seems to be doing quite well. I do not see any signs of active infection at this time which is great news. No fevers, chills, nausea, vomiting, or diarrhea. 01/11/2021 upon evaluation today patient appears to be doing well with regard to his wounds. He has been tolerating the dressing changes without complication. Fortunately there does not appear to be any signs of active infection at this time which is great news. Overall I am extremely pleased with where we stand currently. No fevers, chills, nausea, vomiting, or diarrhea. Where using clobetasol in the wound bed he has a lot of new skin growth which is awesome as  well. 01/18/2021 upon evaluation today patient appears to be doing very well in regard to his leg ulcer. He has been tolerating the dressing changes without complication. Fortunately there does not appear to be any signs of active infection which is great news. In general I think that he is making excellent progress 01/25/2021 upon evaluation today patient appears to be doing well with regard to his wound on the leg. I am actually extremely pleased with where things stand today. There does not appear to be any signs of active infection which is great news and overall I think that we are definitely headed in the appropriate direction based on what I am seeing currently. There does not appear to be any signs of active infection also excellent news. 02/06/2021 upon evaluation today patient appears to be doing well with regard to his wound. Overall visually this is showing signs of significant improvement which is great news. I do not see any signs of active infection systemically which is great even locally  I do not think that we are seeing any major complications here. We did do fluorescence imaging with the [employer] today. The patient does have some odor and drainage noted and again this is something that I think would benefit him to probably come more frequently for nurse visits. 02/19/2021 upon evaluation today patient actually appears to be doing quite well in regard to his wound. He has been tolerating the dressing Salva, Dominiq ([id number]) changes without complication and overall I think that this is making excellent progress. I do not see any evidence of active infection at this point which is great news as well. No fevers, chills, nausea, vomiting, or diarrhea. 10/10; wound is made nice progress healthy granulation with a nice rim of epithelialization which seems to be expanding even from last week he has a deeper area in the inferior part of the more distal part of the wound with not quite as  healthy as surface. This area will need to be followed. Using clobetasol and Hydrofera Blue 03/05/2021 upon evaluation today patient appears to be doing very well in regard to his leg ulcer. He has been tolerating dressing changes without complication. Fortunately there does not appear to be any signs of active infection which is great news and overall I am extremely pleased with where we stand currently. 03/12/2021 upon evaluation today patient appears to be doing well with regard to his wound in fact this is extremely extremely good based on what we are seeing today there does not appear to be any signs of active infection and overall I think that he is doing awesome from the standpoint of healing in general. I am extremely pleased with how things seem to be progressing with regard to this pyoderma. Clobetasol has done wonders for him. I think the compression wrapping has also been of great benefit. 03/19/2021 upon evaluation today patient appears to be doing well with regard to his wound. He is tolerating the dressing changes without complication. In fact I feel like that he is actually making excellent progress at this point based on what I am seeing. No fevers, chills, nausea, vomiting, or diarrhea. 03/26/2021 upon evaluation today patient appears to be doing well with regard to his wound. This again is measuring smaller and looking better. Again the progress is slow but nonetheless continual with what we have been seeing. I do believe that the current plan is doing awesome for him. 04/09/2021 upon evaluation today patient appears to be doing well with regard to his leg ulcer. This is showing signs of excellent improvement the muscle is completely closed over and there does not appear to be any evidence of inflammation at this point his drainage is significantly improved. Overall I think that he would be a good candidate for looking into a skin substitute at this point as well. We will get a look  into some approvals in that regard. Potentially TheraSkin as well as Apligraf could both be considered just depending on insurance coverage. 04/16/2021 upon evaluation today patient appears to be doing well at this point. He has been approved for the Apligraf which we could definitely order although I would like to try to get the TheraSkin approved if at all possible. I did fax notes into them today and I Georgina Peer try to give a call as well. Overall the wound appears to be doing decently well today. 04/20/2021 upon evaluation today patient actually appears to be doing quite well in regard to his wound with that being said we  are trying to see what we can do about speeding up the healing process. For that reason we did discuss the possibility of a skin substitute. We got the Apligraf approved. For that reason we will get a go ahead and see what we can do with the Apligraf at this point. I am still trying to get the TheraSkin approved but I have not heard anything from the insurance company yet I have called and talked to them earlier in the week and to be honest this was about half an hour that I spent on the phone they told me I would hear something within 10-15 business days. 04/27/2021 upon evaluation today patient appears to be doing excellent in regard to his wound. I do believe the Apligraf has been beneficial. With that being said he has had a little bit of increased pain has been a little bit concerned. I do want to go ahead and apply his steroid cream today the clobetasol and then put the Apligraf over I just do not think I want to risk not putting the clobetasol on based on what is been going on here currently. Patient voiced understanding. 05/04/2021 upon evaluation today patient is making excellent improvement. Overall there is definitely decrease in the size of the wound today and I am very pleased in that regard. I do not see any signs of active infection locally nor systemically at this  point. No fevers, chills, nausea, vomiting, or diarrhea. 05/11/2021 upon evaluation today patient appears to be doing well with regard to his wound. He has been tolerating Apligraf's without complication and is making excellent progress this is application #4 today. 12/30; patient here for Apligraf application. Appears to be doing well small wound there has been a lot of healing 05/28/2021 upon evaluation today patient appears to be doing well with regard to his wound. Has been tolerating the dressing changes without complication. Fortunately I do not see any signs of active infection locally nor systemically at this point which is great news. He is done with the Apligraf and the first round and overall this has filled in quite nicely I am not even certain he needs any additional at the moment. I would actually try to go back to clobetasol with [facility] which previously was doing well for him. 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 He is using clobetasol and Hydrofera Blue. Surface area of the wound is smaller and the surface looks very healthy. 06/11/2021 upon evaluation today patient actually appears to be showing signs of good improvement which is great news. Fortunately I do not see any signs of active infection at this time which is also excellent news. I do believe that the patient is doing well with the clobetasol and Hydrofera Blue. He does have some irritation and itching around the rest of the leg will use some triamcinolone over this area. 06/18/2021 upon evaluation today patient appears to be doing well with regard to his wound. He is showing signs of excellent improvement and overall I am extremely pleased with where we stand today. We are moving in the right direction. 06/25/2021 upon evaluation today patient appears to be doing well with regard to his wound. In fact this is doing also not showing signs of excellent improvement  overall very pleased with where we stand today. I do not see any evidence of active infection locally or systemically at this time which is also great news. 07/02/2021 upon evaluation patient's wound  bed actually showed signs of good granulation and epithelization at this point. And this is measuring significantly smaller and overall I am extremely pleased with where we stand today. No fevers, chills, nausea, vomiting, or diarrhea. 07/09/2021 upon evaluation patient's wound bed actually showed signs of good granulation and epithelization at this point. Fortunately there does not appear to be any evidence of active infection locally or systemically which is great news and overall I am extremely pleased with where we stand at this point. 07/16/2021 upon evaluation today patient appears to be doing well with regard to his wound all things considered although it is maybe a little bit larger than what its been. This does have me a little concerned. Actually question about whether anything is changed and initially he told me AVELINO, HERREN ([id number]) know. In fact it was not until the end of the visit that he actually did mention that he had missed his infusion in January. This very well may be what is because the difference is also seeing currently. That definitely has seem to been helping more recently. 07/23/2021 upon evaluation today patient's wound is yet again larger despite the switch to a collagen which was not beneficial. Subsequently I am going to at this point check on a couple things. First and foremost I do want to go ahead and do a culture. I think that there is a chance he could have a low-lying infection that may be part of the issue here. Subsequently I am also probably can go ahead and place him on Bactrim DS which I think is a good option and has done well with them in the past. Again if this is an infection that should hopefully help to turn things around. With all that being said I do  believe that the patient should hopefully be able to turn this around with reinitiation of the infusion therapy which will be going on this week and subsequently getting on the antibiotic. 3/13; patient presents for follow-up. He has no issues or complaints today. He states he is currently on oral antibiotics for previous culture done in the office. 08/06/2021 upon evaluation today patient appears to be doing well with regard to his wound. I am actually seeing signs of improvement which is great news. I think that he is on a better track he did receive his infusion as well which is great news and overall I think that this should hopefully get him back on track as far as healing is concerned. He is not having any pain and I do not see any signs of inflammation which is great news. 08/13/2021 upon evaluation today patient appears to be doing excellent in regard to his wound on the leg. He has been tolerating the dressing changes without complication. Fortunately I do not see any signs of infection I do believe he is making good progress here and I think her back on track overall the tissue is greatly improved compared to what we have been. 08/20/2021 upon evaluation today patient appears to be doing excellent in regard to his wound. He has been tolerating the dressing changes without complication. Fortunately I do not see any evidence of infection at this time locally or systemically which is great news and overall very pleased with where we stand. 08-31-2021 upon evaluation today patient appears to be doing excellent in regard to his wound. Overall even with the vacation and extra walking he seems to have really done well. Fortunately I do not see any evidence of active  infection locally or systemically which is great news and overall I think we are headed in the right direction. 09-10-2021 upon evaluation today patient's wound is showing signs of improvement. This is a small improvement but nonetheless we  seem to be making progress here. I am very pleased in that regard. There does not appear to be any signs of infection and overall things are doing quite nicely. Upon inspection patient's wound bed actually showed signs of good granulation and epithelization at this point. There was some irritation around the edges of the wound where the good skin was it almost appears that something was rubbing or else is was due from drainage and irritation. Nonetheless I am going to see about putting a little bit of zinc just around the wound opening in order to protect the skin from being damaged. The patient is in agreement with that plan. 5/8; its been a while since I have seen this wound and it looks considerably better although still with some depth. Per our intake nurse the dimensions have improved patient has been using a thin layer of clobetasol, Hydrofera Blue under 4-layer compression. He appears to be making progress 10-01-2021 upon evaluation today patient appears to be doing well with regard to his wound although it is somewhat stalled I feel like were basically at a standstill here. He has previously had Apligraf which did very well for him to be honest. I do believe that he may benefit from a repeat of the Apligraf. In fact it got so small at that point but I do not think we even used all of the applications that were recommended as this had improved to such a degree. Nonetheless I do believe that the patient currently could benefit from repeat treatment with the Apligraf due to the improvement this that we did see. He is not opposed to this and subsequently working to look into getting approval to reinstitute that treatment. 10-08-2021 upon evaluation today patient appears to be doing well with regard to his wound. Overall I feel like he is doing a little bit better from the standpoint of irritation at this time which is great news. I do not see any signs of active infection locally or systemically  which is great news as well. No fevers, chills, nausea, vomiting, or diarrhea. Electronic Signature(s) Signed: 10/08/2021 9:12:03 AM By: Worthy Keeler PA-C Entered By: Worthy Keeler on 10/08/2021 09:12:03 RAMAL, ECKHARDT ([id number]) -------------------------------------------------------------------------------- Physical Exam Details Patient Name: [name] Date of Service: 10/08/2021 8:15 AM Medical Record Number: [id number] Patient Account Number: [id number] Date of Birth/Sex: 21-Jul-1980 (40 y.o. M) Treating RN: [name] Primary Care Provider: [name] Other Clinician: Referring Provider: [name] Treating Provider/Extender: [name] Cliche in Treatment: 76 Constitutional Well-nourished and well-hydrated in no acute distress. Respiratory normal breathing without difficulty. Psychiatric this patient is able to make decisions and demonstrates good insight into disease process. Alert and Oriented x 3. pleasant and cooperative. Notes Upon evaluation patient's wound bed actually showed signs of good granulation and epithelization at this point. Fortunately I do not see any evidence of infection I do think we need to do something to jumpstart things here though and I do believe Apligraf would be appropriate for the patient. He is in agreement with that plan it is covered he has met his out-of-pocket as well as his deductible for the year. For that reason I do believe that we can go ahead and proceed with the Apligraf which has done well for him  in the past. Hopefully we can get this wound closed this time completely. Electronic Signature(s) Signed: 10/08/2021 9:12:51 AM By: Worthy Keeler PA-C Entered By: Worthy Keeler on 10/08/2021 09:12:51 JUANYA, VILLAVICENCIO ([id number]) -------------------------------------------------------------------------------- Physician Orders Details Patient Name: [name] Date of Service: 10/08/2021 8:15 AM Medical Record Number:  [id number] Patient Account Number: [id number] Date of Birth/Sex: 14-Dec-1980 (40 y.o. M) Treating RN: [name] Primary Care Provider: [name] Other Clinician: Referring Provider: [name] Treating Provider/Extender: [name] Cliche in Treatment: [phone] Verbal / Phone Orders: No Diagnosis Coding ICD-10 Coding Code Description I87.2 Venous insufficiency (chronic) (peripheral) L97.222 Non-pressure chronic ulcer of left calf with fat layer exposed E11.622 Type 2 diabetes mellitus with other skin ulcer L88 Pyoderma gangrenosum F17.208 Nicotine dependence, unspecified, with other nicotine-induced disorders Follow-up Appointments o Return Appointment in 1 week. o Nurse Visit as needed - twice a week Bathing/ Shower/ Hygiene o Clean wound with Normal Saline or wound cleanser. o May shower with wound dressing protected with water repellent cover or cast protector. o No tub bath. Edema Control - Lymphedema / Segmental Compressive Device / Other o Optional: One layer of unna paste to top of compression wrap (to act as an anchor). - He needs this to hold it up o Elevate, Exercise Daily and Avoid Standing for Long Periods of Time. o Elevate legs to the level of the heart and pump ankles as often as possible o Elevate leg(s) parallel to the floor when sitting. Wound Treatment Wound #1 - Lower Leg Wound Laterality: Left, Lateral Cleanser: Wound Cleanser 3 x Per Week/30 Days Discharge Instructions: Wash your hands with soap and water. Remove old dressing, discard into plastic bag and place into trash. Cleanse the wound with Wound Cleanser prior to applying a clean dressing using gauze sponges, not tissues or cotton balls. Do not scrub or use excessive force. Pat dry using gauze sponges, not tissue or cotton balls. Peri-Wound Care: Desitin Maximum Strength Ointment 4 (oz) 3 x Per Week/30 Days Discharge Instructions: Apply around wound edges to avoid  maceration Topical: Clobetasol Propionate ointment 0.05%, 60 (g) tube 3 x Per Week/30 Days Discharge Instructions: Pt to bring to appt- applied to wound bed Primary Dressing: Hydrofera Blue Ready Transfer Foam, 4x5 (in/in) 3 x Per Week/30 Days Discharge Instructions: Apply Hydrofera Blue Ready to wound bed as directed Secondary Dressing: (NON-BORDER) Zetuvit Plus Silicone NON-BORDER 5x5 (in/in) 3 x Per Week/30 Days Compression Wrap: Medichoice 4 layer Compression System, 35-40 mmHG (Generic) 3 x Per Week/30 Days Discharge Instructions: Apply multi-layer wrap as directed. Electronic Signature(s) Signed: 10/08/2021 4:26:20 PM By: Worthy Keeler PA-C Signed: 10/08/2021 4:42:58 PM By: [name] [name], RN, [name], [name], [name] Entered By: [name], [name], RN, [name], [name] on 10/08/2021 08:51:07 GAYNOR, FERRERAS ([id number]) -------------------------------------------------------------------------------- Problem List Details Patient Name: [name] Date of Service: 10/08/2021 8:15 AM Medical Record Number: [id number] Patient Account Number: [id number] Date of Birth/Sex: 02-24-1981 (40 y.o. M) Treating RN: [name] Primary Care Provider: [name] Other Clinician: Referring Provider: [name] Treating Provider/Extender: [name] Cliche in Treatment: 252 Active Problems ICD-10 Encounter Code Description Active Date MDM Diagnosis I87.2 Venous insufficiency (chronic) (peripheral) 12/04/2016 No Yes L97.222 Non-pressure chronic ulcer of left calf with fat layer exposed 12/04/2016 No Yes E11.622 Type 2 diabetes mellitus with other skin ulcer 04/09/2021 No Yes L88 Pyoderma gangrenosum 03/26/2017 No Yes F17.208 Nicotine dependence, unspecified, with other nicotine-induced disorders 04/24/2020 No Yes Inactive Problems ICD-10 Code Description Active Date Inactive Date L97.213 Non-pressure chronic ulcer  of right calf with necrosis of muscle 04/02/2017 04/02/2017 Resolved Problems ICD-10 Code  Description Active Date Resolved Date L97.321 Non-pressure chronic ulcer of left ankle limited to breakdown of skin 10/08/2019 10/08/2019 L03.116 Cellulitis of left lower limb 05/24/2019 05/24/2019 Electronic Signature(s) Signed: 10/08/2021 8:14:34 AM By: Worthy Keeler PA-C Entered By: Worthy Keeler on 10/08/2021 08:14:34 Pavlovic, Hermes ([id number]) -------------------------------------------------------------------------------- Progress Note Details Patient Name: [name] Date of Service: 10/08/2021 8:15 AM Medical Record Number: [id number] Patient Account Number: [id number] Date of Birth/Sex: 1981/05/14 (40 y.o. M) Treating RN: [name] Primary Care Provider: [name] Other Clinician: Referring Provider: [name] Treating Provider/Extender: [name] Cliche in Treatment: 61 Subjective Chief Complaint Information obtained from Patient He is here in follow up evaluation for LLE pyoderma ulcer History of Present Illness (HPI) 12/04/16; 42 year old man who comes into the clinic today for review of a wound on the posterior left calf. He tells me that is been there for about a year. He is not a diabetic he does smoke half a pack per day. He was seen in the ER on 11/20/16 felt to have cellulitis around the wound and was given clindamycin. An x-ray did not show osteomyelitis. The patient initially tells me that he has a milk allergy that sets off a pruritic itching rash on his lower legs which she scratches incessantly and he thinks that's what may have set up the wound. He has been using various topical antibiotics and ointments without any effect. He works in a [employer]ing [employer] and is on his feet all day. He does not have a prior history of wounds however he does have the rash on both lower legs the right arm and the ventral aspect of his left arm. These are excoriations and clearly have had scratching however there are of macular looking areas on both legs including a substantial  larger area on the right leg. This does not have an underlying open area. There is no blistering. The patient tells me that 2 years ago in [place] in response to the rash on his legs he saw a dermatologist who told him he had a condition which may be pyoderma gangrenosum although I may be putting words into his mouth. He seemed to recognize this. On further questioning he admits to a 5 year history of quiesced. ulcerative colitis. He is not in any treatment for this. He's had no recent travel 12/11/16; the patient arrives today with his wound and roughly the same condition we've been using silver alginate this is a deep punched out wound with some surrounding erythema but no tenderness. Biopsy I did did not show confirmed pyoderma gangrenosum suggested nonspecific inflammation and vasculitis but does not provide an actual description of what was seen by the pathologist. I'm really not able to understand this We have also received information from the patient's dermatologist in [place] notes from April 2016. This was a doctor Agarwal-antal. 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 He was prescribed topical steroid high potency under occlusion which helped but at this point the patient did not have a deep punched out wound. 12/18/16; the patient's wound is larger in terms of surface area however this surface looks better and there is less depth. The surrounding erythema also is better. The patient states that the wrap we put on came off 2 days ago when he has been using his compression stockings. He we are in the process of getting a dermatology consult. 12/26/16 on evaluation today patient's left  lower extremity wound shows evidence of infection with surrounding erythema noted. He has been tolerating the dressing changes but states that he has noted more discomfort. There is a larger area of erythema surrounding the wound. No fevers, chills, nausea, or vomiting noted at this time. With that  being said the wound still does have slough covering the surface. He is not allergic to any medication that he is aware of at this point. In regard to his right lower extremity he had several regions that are erythematous and pruritic he wonders if there's anything we can do to help that. 01/02/17 I reviewed patient's wound culture which was obtained his visit last week. He was placed on doxycycline at that point. Unfortunately that does not appear to be an antibiotic that would likely help with the situation however the pseudomonas noted on culture is sensitive to Cipro. Also unfortunately patient's wound seems to have a large compared to last week's evaluation. Not severely so but there are definitely increased measurements in general. He is continuing to have discomfort as well he writes this to be a seven out of 10. In fact he would prefer me not to perform any debridement today due to the fact that he is having discomfort and considering he has an active infection on the little reluctant to do so anyway. No fevers, chills, nausea, or vomiting noted at this time. 01/08/17; patient seems dermatology on September 5. I suspect dermatology will want the slides from the biopsy I did sent to their pathologist. I'm not sure if there is a way we can expedite that. In any case the culture I did before I left on vacation 3 weeks ago showed Pseudomonas he was given 10 days of Cipro and per her description of her intake nurses is actually somewhat better this week although the wound is quite a bit bigger than I remember the last time I saw this. He still has 3 more days of Cipro 01/21/17; dermatology appointment tomorrow. He has completed the ciprofloxacin for Pseudomonas. Surface of the wound looks better however he is had some deterioration in the lesions on his right leg. Meantime the left lateral leg wound we will continue with sample 01/29/17; patient had his dermatology appointment but I can't yet see that  note. He is completed his antibiotics. The wound is more superficial but considerably larger in circumferential area than when he came in. This is in his left lateral calf. He also has swollen erythematous areas with superficial wounds on the right leg and small papular areas on both arms. There apparently areas in her his upper thighs and buttocks I did not look at those. Dermatology biopsied the right leg. Hopefully will have their input next week. 02/05/17; patient went back to see his dermatologist who told him that he had a "scratching problem" as well as staph. He is now on a 30 day course of doxycycline and I believe she gave him triamcinolone cream to the right leg areas to help with the itching [not exactly sure but probably triamcinolone]. She apparently looked at the left lateral leg wound although this was not rebiopsied and I think felt to be ultimately part of the same pathogenesis. He is using sample border foam and changing nevus himself. He now has a new open area on the right posterior leg which was his biopsy site I don't have any of the dermatology notes 02/12/17; we put the patient in compression last week with SANTYL to the wound on the left  leg and the biopsy. Edema is much better and the depth of the wound is now at level of skin. Area is still the same Biopsy site on the right lateral leg we've also been using santyl with a border foam dressing and he is changing this himself. 02/19/17; Using silver alginate started last week to both the substantial left leg wound and the biopsy site on the right wound. He is tolerating compression well. Has a an appointment with his primary M.D. tomorrow wondering about diuretics although I'm wondering if the edema problem is actually lymphedema DETRICH, RAKESTRAW ([id number]) 02/26/17; the patient has been to see his primary doctor Dr. Jerrel Ivory at [place] our primary care. She started him on Lasix 20 mg and this seems to have helped with the  edema. However we are not making substantial change with the left lateral calf wound and inflammation. The biopsy site on the right leg also looks stable but not really all that different. 03/12/17; the patient has been to see vein and vascular Dr. Lucky Cowboy. He has had venous reflux studies I have not reviewed these. I did get a call from his dermatology office. 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 They therefore recommended clobetasol and tetracycline i.e. aggressive treatment for possible pyoderma gangrenosum. 03/26/17; apparently the patient just had reflux studies not an appointment with Dr. dew. She arrives in clinic today having applied clobetasol for 2-3 weeks. He notes over the last 2-3 days excessive drainage having to change the dressing 3-4 times a day and also expanding erythema. 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 Lillia Carmel at [facility] dermatology locally who kindly saw him at my request. 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 My concern was the degree of erythema medially extending from his left leg wound which was either inflammation from pyoderma or cellulitis. I put him on Augmentin however culture of the wound showed Pseudomonas which is quinolone sensitive. I really don't believe he has cellulitis however in view of everything I will continue and give him a course of Cipro. He is also on doxycycline  as an immune modulator for the pyoderma. In addition to his original wound on the left lateral leg with surrounding erythema he has a wound on the right posterior calf which was an original biopsy site done by dermatology. This was felt to represent pathergy from pyoderma gangrenosum 04/16/17; pyoderma gangrenosum. Saw Dr. Lillia Carmel yesterday. He has been using topical antibiotics to both wound areas his original wound on the left and the biopsies/pathergy area on the right. There is definitely some improvement in the inflammation around the wound on the right although the patient states he has increasing sensitivity of the wounds. He is on prednisone 60 and doxycycline 1 as prescribed by Dr. Lillia Carmel. He is covering the topical antibiotic with gauze and putting this in his own compression stocks and changing this daily. He states that Dr. Lottie Rater  did a culture of the left leg wound yesterday 05/07/17; pyoderma gangrenosum. The patient saw Dr. Lillia Carmel yesterday and has a follow-up with her in one month. He is still using topical antibiotics to both wounds although he can't recall exactly what type. He is still on prednisone 60 mg. Dr. Lillia Carmel stated that the doxycycline could stop if we were in agreement. He has been using his own compression stocks changing daily 06/11/17; pyoderma gangrenosum with wounds on the left lateral leg and right medial leg. The right medial leg was induced by biopsy/pathergy. The area on the right is essentially healed. Still on high-dose prednisone using topical antibiotics to the wound 07/09/17; pyoderma gangrenosum with wounds on the left lateral leg. The right medial leg has closed and remains closed. He is still on prednisone 60. He tells me he missed his last dermatology appointment with Dr. Lillia Carmel but will make another appointment. He reports that her blood sugar at a recent screen in [place] was high 200's. He was 180 today. 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 I think he is going to require still much longer prednisone perhaps another 3 months before attempting to taper. In the meantime his wound is a lot better. Smaller. He is cleaning this off daily and applying topical antibiotics. When he was last in the clinic I thought about changing to [facility] and actually put in a couple of calls to dermatology although probably not during their business hours. 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 He continues on oral prednisone. He has been using triple antibiotic ointment. There is surface debris and we will transition to [facility] and have him return in 2 weeks. He has lost 30 pounds since his last appointment with lifestyle modification. He may benefit from topical steroid cream for treatment this can be considered at a later date. 08/22/17 on evaluation today patient appears to actually be doing rather well in regard to his left lateral lower extremity ulcer. He has actually been managed by Dr. Dellia Nims most recently. Patient is currently on oral steroids at this time. This seems to have been of benefit for him. Nonetheless his last visit was actually with Leah on 08/06/17. Currently he is not utilizing any topical steroid creams although this could be of benefit as well. No fevers, chills, nausea, or vomiting noted at this time. 09/05/17 on evaluation today patient appears to be doing better in regard to his left lateral lower extremity ulcer. He has been tolerating the dressing changes without complication. He is using Santyl with good effect. Overall I'm very pleased with how things are standing at this point. Patient likewise is happy that this is doing better. 09/19/17 on evaluation today patient actually appears to be doing rather well in regard to his left lateral lower extremity ulcer. Again this  is secondary to Pyoderma gangrenosum and he seems to be progressing well with the Santyl which is good news. He's not having any significant pain. 10/03/17 on evaluation today patient appears to be doing excellent in regard to his lower extremity wound on the left secondary to Pyoderma gangrenosum. He has been tolerating the Santyl without complication and in general I feel like he's making good progress. 10/17/17 on evaluation today patient appears to be doing very well in regard to his left lateral lower surety ulcer. He has  been tolerating the dressing changes without complication. There does not appear to be any evidence of infection he's alternating the Santyl and the triple antibiotic ointment every other day this seems to be doing well for him. 11/03/17 on evaluation today patient appears to be doing very well in regard to his left lateral lower extremity ulcer. He is been tolerating the dressing changes without complication which is good news. Fortunately there does not appear to be any evidence of infection which is also great news. Overall is doing excellent they are starting to taper down on the prednisone is down to 40 mg at this point it also started topical clobetasol for him. 11/17/17 on evaluation today patient appears to be doing well in regard to his left lateral lower surety ulcer. He's been tolerating the dressing changes without complication. He does note that he is having no pain, no excessive drainage or discharge, and overall he feels like things are going about how he would expect and hope they would. Overall he seems to have no evidence of infection at this time in my opinion which is good news. 12/04/17-He is seen in follow-up evaluation for right lateral lower extremity ulcer. He has been applying topical steroid cream. Today's measurement show slight increase in size. Over the next 2 weeks we will transition to every other day Santyl and steroid cream. He has been encouraged  to monitor for changes and notify clinic with any concerns 12/15/17 on evaluation today patient's left lateral motion the ulcer and fortunately is doing worse again at this point. This just since last week to this week has close to doubled in size according to the patient. I did not seeing last week's I do not have a visual to compare this to in our system was also down so we do not have all the charts and at this point. Nonetheless it does have me somewhat concerned in regard to the fact that again he was worried enough about it he has contact the dermatology that placed them back on the full strength, 50 mg a day of the prednisone that he was taken previous. He continues to alternate using clobetasol along with Santyl at this point. He is obviously somewhat frustrated. MANOLO, BOSKET ([id number]) 12/22/17 on evaluation today patient appears to be doing a little worse compared to last evaluation. Unfortunately the wound is a little deeper and slightly larger than the last week's evaluation. With that being said he has made some progress in regard to the irritation surrounding at this time unfortunately despite that progress that's been made he still has a significant issue going on here. 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 For that reason I discussed with him today the possibility of perform a wound culture to ensure there's nothing overtly infected. 01/06/18 on evaluation today patient's wound is larger and deeper than previously evaluated. With that being said it did appear that his wound was infected after my last evaluation with him. Subsequently I did end up prescribing a prescription for Bactrim DS which she has been taking and having no complication with. Fortunately there does not appear to be any evidence of infection at this point in time as  far as anything spreading, no want to touch, and overall I feel like things are showing signs of improvement. 01/13/18 on evaluation today patient appears to be even a little  larger and deeper than last time. There still muscle exposed in the base of the wound. Nonetheless he does appear to be less erythematous I do believe inflammation is calming down also believe the infection looks like it's probably resolved at this time based on what I'm seeing. No fevers, chills, nausea, or vomiting noted at this time. 01/30/18 on evaluation today patient actually appears to visually look better for the most part. Unfortunately those visually this looks better he does seem to potentially have what may be an abscess in the muscle that has been noted in the central portion of the wound. This is the first time that I have noted what appears to be fluctuance in the central portion of the muscle. With that being said I'm somewhat more concerned about the fact that this might indicate an abscess formation at this location. I do believe that an ultrasound would be appropriate. This is likely something we need to try to do as soon as possible. He has been switch to mupirocin ointment and he is no longer using the steroid ointment as prescribed by dermatology he sees them again next week he's been decreased from 60 to 40 mg of prednisone. 03/09/18 on evaluation today patient actually appears to be doing a little better compared to last time I saw him. There's not as much erythema surrounding the wound itself. He I did review his most recent infectious disease note which was dated 02/24/18. He saw Dr. Michel Bickers in [place]. 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 Patient is now off of antibiotics and they are continually observing this. There seems to be no change in the past two weeks in my pinion based on what the patient says  and what I see today compared to what Dr. Megan Salon likely saw two weeks ago. No fevers, chills, nausea, or vomiting noted at this time. 03/23/18 on evaluation today patient's wound actually appears to be showing signs of improvement which is good news. He is currently still on the Dapsone. He is also working on tapering the prednisone to get off of this and Dr. Lottie Rater is working with him in this regard. Nonetheless overall I feel like the wound is doing well it does appear based on the infectious disease note that I reviewed from Dr. 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 I did also review the results of his ultrasound of left lower extremity which revealed there was a dentist tissue in the base of the wound without an abscess noted. 04/06/18 on evaluation today the patient's left lateral lower extremity ulcer actually appears to be doing fairly well which is excellent news. There does not appear to be any evidence of infection at this time which is also great news. Overall he still does have a significantly large ulceration although little by little he seems to be making progress. He is down to 10 mg a day of the prednisone. 04/20/18 on evaluation today patient actually appears to be doing excellent at this time in regard to his left lower extremity ulcer. He's making signs of good progress unfortunately this is taking much longer than we would really like to see but nonetheless he is making progress. Fortunately there does not appear to be any evidence of infection at this time. No  fevers, chills, nausea, or vomiting noted at this time. The patient has not been using the Santyl due to the cost he hadn't got in this field yet. He's mainly been using the antibiotic ointment topically. Subsequently he also tells me that he really has not been scrubbing in the shower I think this would  be helpful again as I told him it doesn't have to be anything too aggressive to even make it believe just enough to keep it free of some of the loose slough and biofilm on the wound surface. 05/11/18 on evaluation today patient's wound appears to be making slow but sure progress in regard to the left lateral lower extremity ulcer. He is been tolerating the dressing changes without complication. Fortunately there does not appear to be any evidence of infection at this time. He is still just using triple antibiotic ointment along with clobetasol occasionally over the area. He never got the Santyl and really does not seem to intend to in my pinion. 06/01/18 on evaluation today patient appears to be doing a little better in regard to his left lateral lower extremity ulcer. He states that overall he does not feel like he is doing as well with the Dapsone as he did with the prednisone. Nonetheless he sees his dermatologist later today and is gonna talk to them about the possibility of going back on the prednisone. Overall again I believe that the wound would be better if you would utilize Santyl but he really does not seem to be interested in going back to the [place] at this point. He has been using triple antibiotic ointment. 06/15/18 on evaluation today patient's wound actually appears to be doing about the same at this point. Fortunately there is no signs of infection at this time. He has made slight improvements although he continues to not really want to clean the wound bed at this point. He states that he just doesn't mess with it he doesn't want to cause any problems with everything else he has going on. He has been on medication, antibiotics as prescribed by his dermatologist, for a staff infection of his lower extremities which is really drying out now and looking much better he tells me. Fortunately there is no sign of overall infection. 06/29/18 on evaluation today patient appears to be doing well  in regard to his left lateral lower surety ulcer all things considering. Fortunately his staff infection seems to be greatly improved compared to previous. He has no signs of infection and this is drying up quite nicely. He is still the doxycycline for this is no longer on cental, Dapsone, or any of the other medications. His dermatologist has recommended possibility of an infusion but right now he does not want to proceed with that. 07/13/18 on evaluation today patient appears to be doing about the same in regard to his left lateral lower surety ulcer. Fortunately there's no signs of infection at this time which is great news. Unfortunately he still builds up a significant amount of Slough/biofilm of the surface of the wound he still is not really cleaning this as he should be appropriately. Again I'm able to easily with saline and gauze remove the majority of this on the surface which if you would do this at home would likely be a dramatic improvement for him as far as getting the area to improve. Nonetheless overall I still feel like he is making progress is just very slow. I think Santyl will be of benefit for him as  well. Still he has not gotten this as of this point. 07/27/18 on evaluation today patient actually appears to be doing little worse in regards of the erythema around the periwound region of the wound he also tells me that he's been having more drainage currently compared to what he was experiencing last time I saw him. He states not quite as bad as what he had because this was infected previously but nonetheless is still appears to be doing poorly. Fortunately there is no evidence of systemic infection at this point. The patient tells me that he is not going to be able to afford the Santyl. He is still waiting to hear about the infusion therapy with his dermatologist. Apparently she wants an updated colonoscopy first. LANI, MENDIOLA ([id number]) 08/10/18 on evaluation today patient appears  to be doing better in regard to his left lateral lower extremity ulcer. Fortunately he is showing signs of improvement in this regard he's actually been approved for Remicade infusion's as well although this has not been scheduled as of yet. Fortunately there's no signs of active infection at this time in regard to the wound although he is having some issues with infection of the right lower extremity is been seen as dermatologist for this. Fortunately they are definitely still working with him trying to keep things under control. 09/07/18 on evaluation today patient is actually doing rather well in regard to his left lateral lower extremity ulcer. He notes these actually having some hair grow back on his extremity which is something he has not seen in years. He also tells me that the pain is really not giving them any trouble at this time which is also good news overall she is very pleased with the progress he's using a combination of the mupirocin along with the probate is all mixed. 09/21/18 on evaluation today patient actually appears to be doing fairly well all things considered in regard to his looks from the ulcer. He's been tolerating the dressing changes without complication. Fortunately there's no signs of active infection at this time which is good news he is still on all antibiotics or prevention of the staff infection. He has been on prednisone for time although he states it is gonna contact his dermatologist and see if she put them on a short course due to some irritation that he has going on currently. Fortunately there's no evidence of any overall worsening this is going very slow I think cental would be something that would be helpful for him although he states that $50 for tube is quite expensive. He therefore is not willing to get that at this point. 10/06/18 on evaluation today patient actually appears to be doing decently well in regard to his left lateral leg ulcer. He's been  tolerating the dressing changes without complication. Fortunately there's no signs of active infection at this time. Overall I'm actually rather pleased with the progress he's making although it's slow he doesn't show any signs of infection and he does seem to be making some improvement. I do believe that he may need a switch up and dressings to try to help this to heal more appropriately and quickly. 10/19/18 on evaluation today patient actually appears to be doing better in regard to his left lateral lower extremity ulcer. This is shown signs of having much less Slough buildup at this point due to the fact he has been using the [employer]. Obviously this is very good news. The overall size of the wound is not dramatically  smaller but again the appearance is. 11/02/18 on evaluation today patient actually appears to be doing quite well in regard to his lower [place] ulcer. A lot of the skin around the ulcer is actually somewhat irritating at this point this seems to be more due to the dressing causing irritation from the adhesive that anything else. Fortunately there is no signs of active infection at this time. 11/24/18 on evaluation today patient appears to be doing a little worse in regard to his overall appearance of his lower extremity ulcer. There's more erythema and warmth around the wound unfortunately. He is currently on doxycycline which he has been on for some time. With that being said I'm not sure that seems to be helping with what appears to possibly be an acute cellulitis with regard to his left lower extremity ulcer. No fevers, chills, nausea, or vomiting noted at this time. 12/08/18 on evaluation today patient's wounds actually appears to be doing significantly better compared to his last evaluation. He has been using Santyl along with alternating tripling about appointment as well as the steroid cream seems to be doing quite well and the wound is showing signs of improvement which is  excellent news. Fortunately there's no evidence of infection and in fact his culture came back negative with only normal skin flora noted. 12/21/2018 upon evaluation today patient actually appears to be doing excellent with regard to his ulcer. This is actually the best that I have seen it since have been helping to take care of him. It is both smaller as well as less slough noted on the surface of the wound and seems to be showing signs of good improvement with new skin growing from the edges. He has been using just the triamcinolone he does wonder if he can get a refill of that ointment today. 01/04/2019 upon evaluation today patient actually appears to be doing well with regard to his left lateral lower extremity ulcer. With that being said it does not appear to be that he is doing quite as well as last time as far as progression is concerned. There does not appear to be any signs of infection or significant irritation which is good news. With that being said I do believe that he may benefit from switching to a collagen based dressing based on how clean The wound appears. 01/18/2019 on evaluation today patient actually appears to be doing well with regard to his wound on the left lower extremity. He is not made a lot of progress compared to where we were previous but nonetheless does seem to be doing okay at this time which is good news. There is no signs of active infection which is also good news. My only concern currently is I do wish we can get him into utilizing the collagen dressing his insurance would not pay for the supplies that we ordered although it appears that he may be able to order this through his supply company that he typically utilizes. This is [employer]. Nonetheless he did try to order it during the office visit today and it appears this did go through. We will see if he can get that it is a different brand but nonetheless he has collagen and I do think will be beneficial. 02/01/2019  on evaluation today patient actually appears to be doing a little worse today in regard to the overall size of his wounds. Fortunately there is no signs of active infection at this time. That is visually. Nonetheless when this is happened  before it was due to infection. For that reason were somewhat concerned about that this time as well. 02/08/2019 on evaluation today patient unfortunately appears to be doing slightly worse with regard to his wound upon evaluation today. Is measuring a little deeper and a little larger unfortunately. I am not really sure exactly what is causing this to enlarge he actually did see his dermatologist she is going to see about initiating Humira for him. Subsequently she also did do steroid injections into the wound itself in the periphery. Nonetheless still nonetheless he seems to be getting a little bit larger he is gone back to just using the steroid cream topically which I think is appropriate. I would say hold off on the collagen for the time being is definitely a good thing to do. Based on the culture results which we finally did get the final result back regarding it shows staph as the bacteria noted again that can be a normal skin bacteria based on the fact however he is having increased drainage and worsening of the wound measurement wise I would go ahead and place him on an antibiotic today I do believe for this. 02/15/2019 on evaluation today patient actually appears to be doing somewhat better in regard to his ulcer. There is no signs of worsening at this time I did review his culture results which showed evidence of Staphylococcus aureus but not MRSA. Again this could just be more related to the normal skin bacteria although he states the drainage has slowed down quite a bit he may have had a mild infection not just colonization. And was much smaller and then since around10/04/2019 on evaluation today patient appears to be doing unfortunately worse as far as the  size of the wound. I really feel like that this is steadily getting larger again it had been doing excellent right at the beginning of September we have seen a steady increase in the area of the wound it is almost 2-1/2 times the size it was on September 1. Obviously this is a bad trend this is not wanting to see. For that reason we went back to using just the topical triamcinolone cream which does seem to help with inflammation. I checked him for bacteria by way of culture and nothing showed positive there. I am considering giving him a short course of a tapering steroid Joseantonio Dittmar, Denvil ([id number]) today to see if that is can be beneficial for him. The patient is in agreement with giving that a try. 03/08/2019 on evaluation today patient appears to be doing very well in comparison to last evaluation with regard to his lower extremity ulcer. This is showing signs of less inflammation and actually measuring slightly smaller compared to last time every other week over the past month and a half he has been measuring larger larger larger. Nonetheless I do believe that the issue has been inflammation the prednisone does seem to have been beneficial for him which is good news. No fevers, chills, nausea, vomiting, or diarrhea. 03/22/2019 on evaluation today patient appears to be doing about the same with regard to his leg ulcer. He has been tolerating the dressing changes without complication. With that being said the wound seems to be mostly arrested at its current size but really is not making any progress except for when we prescribed the prednisone. He did show some signs of dropping as far as the overall size of the wound during that interval week. Nonetheless this is something he is not  on long-term at this point and unfortunately I think he is getting need either this or else the Humira which his dermatologist has discussed try to get approval for. With that being said he will be seeing his  dermatologist on the 11th of this month that is November. 04/19/2019 on evaluation today patient appears to be doing really about the same the wound is measuring slightly larger compared to last time I saw him. He has not been into the office since November 2 due to the fact that he unfortunately had Covid as that his entire family. He tells me that it was rough but they did pull-through and he seems to be doing much better. Fortunately there is no signs of active infection at this time. No fevers, chills, nausea, vomiting, or diarrhea. 05/10/2019 on evaluation today patient unfortunately appears to be doing significantly worse as compared to last time I saw him. He does tell me that he has had his first dose of Humira and actually is scheduled to get the next one in the upcoming week. With that being said he tells me also that in the past several days he has been having a lot of issues with green drainage she showed me a picture this is more blue-green in color. He is also been having issues with increased sloughy buildup and the wound does appear to be larger today. Obviously this is not the direction that we want everything to take based on the starting of his Humira. Nonetheless I think this is definitely a result of likely infection and to be honest I think this is probably Pseudomonas causing the infection based on what I am seeing. 05/24/2019 on evaluation today patient unfortunately appears to be doing significantly worse compared to his prior evaluation with me 2 weeks ago. I did review his culture results which showed that he does have Staph aureus as well as Pseudomonas noted on the culture. Nonetheless the Levaquin that I prescribed for him does not appear to have been appropriate and in fact he tells me he is no longer experiencing the green drainage and discharge that he had at the last visit. Fortunately there is no signs of active infection at this time which is good news although  the wound has significantly worsened it in fact is much deeper than it was previous. We have been utilizing up to this point triamcinolone ointment as the prescription topical of choice but at this time I really feel like that the wound is getting need to be packed in order to appropriately manage this due to the deeper nature of the wound. Therefore something along the lines of an alginate dressing may be more appropriate. 05/31/2019 upon inspection today patient's wound actually showed signs of doing poorly at this point. Unfortunately he just does not seem to be making any good progress despite what we have tried. He actually did go ahead and pick up the Cipro and start taking that as he was noticing more green drainage he had previously completed the Levaquin that I prescribed for him as well. Nonetheless he missed his appointment for the seventh last week on Wednesday with the wound care center and [facility] where his dermatologist referred him. Obviously I do think a second opinion would be helpful at this point especially in light of the fact that the patient seems to be doing so poorly despite the fact that we have tried everything that I really know how at this point. The only thing that  ever seems to have helped him in the past is when he was on high doses of continual steroids that did seem to make a difference for him. 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 06/07/2019 upon evaluation today patient unfortunately appears to be doing worse yet again with regard to his wound. In fact I am starting to question whether or not he may have a fluid pocket in the muscle at this point based on the bulging and the soft appearance to the central portion of the muscle area. There is not anything draining from the muscle itself at this time  which is good news but nonetheless the wound is expanding. I am not really seeing any results of the Humira as far as overall wound progression based on what I am seeing at this point. The patient has been referred for second opinion with regard to his wound to the [facility] wound care center by his dermatologist which I definitely am not in opposition to. Unfortunately we tried multiple dressings in the past including collagen, alginate, and at one point even Hydrofera Blue. With that being said he is never really used it for any significant amount of time due to the fact that he often complains of pain associated with these dressings and then will go back to either using the Santyl which she has done intermittently or more frequently the triamcinolone. He is also using his own compression stockings. We have wrapped him in the past but again that was something else that he really was not a big fan of. 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 He seemed to do much better when he was on the steroids although honestly I understand that the steroids are not necessarily the best medication to be on long-term obviously 06/14/2019 on evaluation today patient appears to be doing actually a little bit better with regard to the overall appearance with his leg. Unfortunately he does continue to have issues with what appears to be some fluid underneath the muscle although he did see the wound specialty center at [facility] last week their main goals were to see about infusion therapy in place of the Humira as they feel like that is not quite strong enough. They also recommended that we continue with the  treatment otherwise as we are they felt like that was appropriate and they are okay with him continuing to follow-up here with [place] in that regard. With that being said they are also sending him to the vein specialist there to see about vein stripping and if that would be of benefit for him. Subsequently they also did not really address whether or not an ultrasound of the muscle area to see if there is anything that needs to be addressed here would be appropriate or not. For that reason I discussed this with him last week I think we may proceed down that road at this point. 06/21/2019 upon evaluation today patient's wound actually appears to  be doing slightly better compared to previous evaluations. I do believe that he has made a difference with regard to the progression here with the use of oral steroids. Again in the past has been the only thing that is really calm things down. He does tell me that from [facility] is gotten a good news from there that there are no further vein stripping that is necessary at this point. I do not have that available for review today although the patient did relay this to me. He also did obtain and have the ultrasound of the wound completed which I did sign off on today. It does appear that there is no fluid collection under the muscle this is likely then just edematous tissue in general. That is also good news. Overall I still believe the inflammation is the main issue here. He did inquire about the possibility of a wound VAC again with the muscle protruding like it is I am not really sure whether the wound VAC is necessarily ideal or not. That is something we will have to consider although I do believe he may need compression wrapping to try to help with edema control which could potentially be of benefit. 06/28/2019 on evaluation today patient appears to be doing slightly better measurement wise although this is not terribly smaller he least seems to be trending towards that  direction. With that being said he still seems to have purulent drainage noted in the wound bed at this time. He has been on Levaquin followed by Cipro over the past month. Unfortunately he still seems to have some issues with active infection at this time. I did perform a culture last week in order to evaluate and see if indeed there was still anything going on. Subsequently the culture did come back Mulligan, Layten ([id number]) showing Pseudomonas which is consistent with the drainage has been having which is blue-green in color. He also has had an odor that again was somewhat consistent with Pseudomonas as well. Long story short it appears that the culture showed an intermediate finding with regard to how well the Cipro will work for the Pseudomonas infection. Subsequently being that he does not seem to be clearing up and at best what we are doing is just keeping this at [place] I think he may need to see infectious disease to discuss IV antibiotic options. 07/05/2019 upon evaluation today patient appears to be doing okay in regard to his leg ulcer. He has been tolerating the dressing changes at this point without complication. Fortunately there is no signs of active infection at this time which is good news. No fevers, chills, nausea, vomiting, or diarrhea. With that being said he does have an appointment with infectious disease tomorrow and his primary care on Wednesday. Again the reason for the infectious disease referral was due to the fact that he did not seem to be fully resolving with the use of oral antibiotics and therefore we were thinking that IV antibiotic therapy may be necessary secondary to the fact that there was an intermediate finding for how effective the Cipro may be. Nonetheless again he has been having a lot of purulent and even green drainage. Fortunately right now that seems to have calmed down over the past week with the reinitiation of the oral antibiotic. Nonetheless we will see  what Dr. Megan Salon has to say. 07/12/2019 upon evaluation today patient appears to be doing about the same at this point in regard to his left lower extremity ulcer. Fortunately there  is no signs of active infection at this time which is good news I do believe the Levaquin has been beneficial I did review Dr. Hale Bogus note and to be honest I agree that the patient's leg does appear to be doing better currently. What we found in the past as he does not seem to really completely resolve he will stop the antibiotic and then subsequently things will revert back to having issues with blue-green drainage, increased pain, and overall worsening in general. Obviously that is the reason I sent him back to infectious disease. 07/19/2019 upon evaluation today patient appears to be doing roughly the same in size there is really no dramatic improvement. He has started back on the Levaquin at this point and though he seems to be doing okay he did still have a lot of blue/green drainage noted on evaluation today unfortunately. I think that this is still indicative more likely of a Pseudomonas infection as previously noted and again he does see Dr. Megan Salon in just a couple of days. I do not know that were really able to effectively clear this with just oral antibiotics alone based on what I am seeing currently. Nonetheless we are still continue to try to manage as best we can with regard to the patient and his wound. I do think the wrap was helpful in decreasing the edema which is excellent news. No fevers, chills, nausea, vomiting, or diarrhea. 07/26/2019 upon evaluation today patient appears to be doing slightly better with regard to the overall appearance of the muscle there is no dark discoloration centrally. Fortunately there is no signs of active infection at this time. No fevers, chills, nausea, vomiting, or diarrhea. Patient's wound bed currently the patient did have an appointment with Dr. Megan Salon at infectious  disease last week. With that being said Dr. Megan Salon the patient states was still somewhat hesitant about put him on any IV antibiotics he wanted [place] to repeat cultures today and then see where things go going forward. He does look like Dr. Megan Salon because of some improvement the patient did have with the Levaquin wanted [place] to see about repeating cultures. If it indeed grows the Pseudomonas again then he recommended a possibility of considering a PICC line placement and IV antibiotic therapy. He plans to see the patient back in 1 to 2 weeks. 08/02/2019 upon evaluation today patient appears to be doing poorly with regard to his left lower extremity. We did get the results of his culture back it shows that he is still showing evidence of Pseudomonas which is consistent with the purulent/blue-green drainage that he has currently. Subsequently the culture also shows that he now is showing resistance to the oral fluoroquinolones which is unfortunate as that was really the only thing to treat the infection prior. I do believe that he is looking like this is going require IV antibiotic therapy to get this under control. Fortunately there is no signs of systemic infection at this time which is good news. The patient does see Dr. Megan Salon tomorrow. 08/09/2019 upon evaluation today patient appears to be doing better with regard to his left lower extremity ulcer in regard to the overall appearance. He is currently on IV antibiotic therapy. As ordered by Dr. Megan Salon. Currently the patient is on ceftazidime which she is going to take for the next 2 weeks and then follow-up for 4 to 5-week appointment with Dr. Megan Salon. The patient started this this past Friday symptoms have not for a total of 3 days currently  in full. 08/16/2019 upon evaluation today patient's wound actually does show muscle in the base of the wound but in general does appear to be much better as far as the overall evidence of infection is  concerned. In fact I feel like this is for the most part cleared up he still on the IV antibiotics he has not completed the full course yet but I think he is doing much better which is excellent news. 08/23/2019 upon evaluation today patient appears to be doing about the same with regard to his wound at this point. He tells me that he still has pain unfortunately. Fortunately there is no evidence of systemic infection at this time which is great news. There is significant muscle protrusion. 09/13/19 upon evaluation today patient appears to be doing about the same in regard to his leg unfortunately. He still has a lot of drainage coming from the ulceration there is still muscle exposed. With that being said the patient's last wound culture still showed an intermediate finding with regard to the Pseudomonas he still having the bluish/green drainage as well. Overall I do not know that the wound has completely cleared of infection at this point. Fortunately there is no signs of active infection systemically at this point which is good news. 09/20/2019 upon evaluation today patient's wound actually appears to be doing about the same based on what I am seeing currently. I do not see any signs of systemic infection he still does have evidence of some local infection and drainage. He did see Dr. Megan Salon last week and Dr. Megan Salon states that he probably does need a different IV antibiotic although he does not want to put him on this until the patient begins the Remicade infusion which is actually scheduled for about 10 days out from today on 13 May. Following that time Dr. Megan Salon is good to see him back and then will evaluate the feasibility of starting him on the IV antibiotic therapy once again at that point. I do not disagree with this plan I do believe as Dr. Megan Salon stated in his note that I reviewed today that the patient's issue is multifactorial with the pyoderma being 1 aspect of this that were hoping  the Remicade will be helpful for her. In the meantime I think the gentamicin is, helping to keep things under decent okay control in regard to the ulcer. 09/27/2019 upon evaluation today patient appears to be doing about the same with regard to his wound still there is a lot of muscle exposure though he does have some hyper granulation tissue noted around the edge and actually some granulation tissue starting to form over the muscle which is actually good news. Fortunately there is no evidence of active infection which is also good news. His pain is less at this point. 5/21; this is a patient I have not seen in a long time. 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 He has a large wound on the left lateral leg with protruding muscle. He comes in the clinic today showing the same area on his left medial ankle. He says there is been a spot there for some time although we have not previously defined this. Today he has a clearly defined area with slight amount of skin breakdown surrounded by raised areas with a purplish hue in color. This is not painful he says it is irritated. This looks distinctly like I might imagine pyoderma starting 10/25/2019 upon evaluation today patient's wound actually appears  to be making some progress. He still has muscle protruding from the lateral portion of his left leg but fortunately the new area that they were concerned about at his last visit does not appear to have opened at this point. He is currently on Remicade infusions and seems to be doing better in my opinion in fact the wound itself seems to be overall much better. The purplish discoloration that he did have seems to have resolved and I think that is a good sign that hopefully the Remicade is doing its job. He does Fatula, Dorthy ([id number]) have some biofilm noted over the surface of the wound. 11/01/2019 on evaluation today patient's wound actually appears to be doing excellent  at this time. Fortunately there is no evidence of active infection and overall I feel like he is making great progress. The Remicade seems to be due excellent job in my opinion. 11/08/19 evaluation today vision actually appears to be doing quite well with regard to his weight ulcer. He's been tolerating dressing changes without complication. Fortunately there is no evidence of infection. No fevers, chills, nausea, or vomiting noted at this time. Overall states that is having more itching than pain which is actually a good sign in my opinion. 12/13/2019 upon evaluation today patient appears to be doing well today with regard to his wound. He has been tolerating the dressing changes without complication. Fortunately there is no sign of active infection at this time. No fevers, chills, nausea, vomiting, or diarrhea. Overall I feel like the infusion therapy has been very beneficial for him. 01/06/2020 on evaluation today patient appears to be doing well with regard to his wound. This is measuring smaller and actually looks to be doing better. Fortunately there is no signs of active infection at this point. No fevers, chills, nausea, vomiting, or diarrhea. With that being said he does still have the blue-green drainage but this does not seem to be causing any significant issues currently. He has been using the gentamicin that does seem to be keeping things under decent control at this point. He goes later this morning for his next infusion therapy for the pyoderma which seems to also be very beneficial. 02/07/2020 on evaluation today patient appears to be doing about the same in regard to his wounds currently. Fortunately there is no signs of active infection systemically he does still have evidence of local infection still using gentamicin. He also is showing some signs of improvement albeit slowly I do feel like we are making some progress here. 02/21/2020 upon evaluation today patient appears to be making  some signs of improvement the wound is measuring a little bit smaller which is great news and overall I am very pleased with where he stands currently. He is going to be having infusion therapy treatment on the 15th of this month. Fortunately there is no signs of active infection at this time. 03/13/2020 I do believe patient's wound is actually showing some signs of improvement here which is great news. He has continue with the infusion therapy through rheumatology/dermatology at [facility]. That does seem to be beneficial. I still think he gets as much benefit from this as he did from the prednisone initially but nonetheless obviously this is less harsh on his body that the prednisone as far as they are concerned. 03/31/2020 on evaluation today patient's wound actually showing signs of some pretty good improvement in regard to the overall appearance of the wound bed. There is still muscle exposed though he does have  some epithelial growth around the edges of the wound. Fortunately there is no signs of active infection at this time. No fevers, chills, nausea, vomiting, or diarrhea. 04/24/2020 upon evaluation today patient appears to be doing about the same in regard to his leg ulcer. He has been tolerating the dressing changes without complication. Fortunately there is no signs of active infection at this time. No fevers, chills, nausea, vomiting, or diarrhea. With that being said he still has a lot of irritation from the bandaging around the edges of the wound. We did discuss today the possibility of a referral to plastic surgery. 05/22/2020 on evaluation today patient appears to be doing well with regard to his wounds all things considered. He has not been able to get the Chantix apparently there is a recall nurse that I was unaware of put out by [employer] involuntarily. Nonetheless for now I am and I have to do some research into what may be the best option for him to help with quitting in regard to smoking and  we discussed that today. 06/26/2020 upon evaluation today patient appears to be doing well with regard to his wound from the standpoint of infection I do not see any signs of infection at this point. With that being said unfortunately he is still continuing to have issues with muscle exposure and again he is not having a whole lot of new skin growth unfortunately. There does not appear to be any signs of active infection at this time. No fevers, chills, nausea, vomiting, or diarrhea. 07/10/2020 upon evaluation today patient appears to be doing a little bit more poorly currently compared to where he was previous. I am concerned currently about an active infection that may be getting worse especially in light of the increased size and tenderness of the wound bed. No fevers, chills, nausea, vomiting, or diarrhea. 07/24/2020 upon evaluation today patient appears to be doing poorly in regard to his leg ulcer. He has been tolerating the dressing changes without complication but unfortunately is having a lot of discomfort. 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 Subsequently I think he is going require possibly IV antibiotics to get this under control. I am very concerned about the severity of his infection and the amount of discomfort he is having. 07/31/2020 upon evaluation today patient appears to be doing about the same in regard to his leg wound. He did see Dr. Megan Salon and Dr. Megan Salon is actually going to start him on IV antibiotics. He goes for the PICC line tomorrow. With that being said there do not have that run for 2 weeks and then see how things are doing and depending on how he is progressing they may extend that a little longer. Nonetheless I am glad this is getting ready to be in place and definitely feel it may help the patient. In the meantime is been using mainly triamcinolone to the wound bed has an  anti-inflammatory. 08/07/2020 on evaluation today patient appears to be doing well with regard to his wound compared even last week. In the interim he has gotten the PICC line placed and overall this seems to be doing excellent. There does not appear to be any evidence of infection which is great news systemically although locally of course has had the infection this appears to be improving with the use of the antibiotics. 08/14/2020 upon evaluation today patient's wound actually showing signs of excellent improvement. Overall the irritation has significantly improved the  drainage is back down to more of a normal level and his pain is really pretty much nonexistent compared to what it was. Obviously I think that this is significantly improved secondary to the IV antibiotic therapy which has made all the difference in the world. Again he had a resistant form of Pseudomonas for which oral antibiotics just was not cutting it. Nonetheless I do think that still we need to consider the possibility of a surgical closure for this wound is been open so long and to be honest with muscle exposed I think this can be very hard to get this to close outside of this although definitely were still working to try to do what we can in that regard. 08/21/2020 upon evaluation today patient appears to be doing very well with regard to his wounds on the left lateral lower extremity/calf area. Fortunately there does not appear to be signs of active infection which is great news and overall very pleased with where things stand today. He is actually wrapping up his treatment with IV antibiotics tomorrow. After that we will see where things go from there. 08/28/2020 upon evaluation today patient appears to be doing decently well with regard to his leg ulcer. There does not appear to be any signs of Curenton, Frankie ([id number]) active infection which is great news and overall very pleased with where things stand today. No fevers,  chills, nausea, vomiting, or diarrhea. 09/18/2020 upon evaluation today patient appears to be doing well with regard to his infection which I feel like is better. Unfortunately he is not doing as well with regard to the overall size of the wound which is not nearly as good at this point. I feel like that he may be having an issue here with the pyoderma being somewhat out of control. I think that he may benefit from potentially going back and talking to the dermatologist about what to do from the pyoderma standpoint. I am not certain if the infusions are helping nearly as much is what the prednisone did in the past. 10/02/2020 upon evaluation today patient appears to be doing well with regard to his leg ulcer. He did go to the [facility]. Unfortunately they feel like there is a 10% chance that most that he would be able to heal and that the skin graft would take. Obviously this has led him to not be able to go down that path as far as treatment is concerned. Nonetheless he does seem to be doing a little bit better with the prednisone that I gave him last time. I think that he may need to discuss with dermatology the possibility of long-term prednisone as that seems to be what is most helpful for him to be perfectly honest. I am not sure the Remicade is really doing the job. 10/17/2020 upon evaluation today patient appears to be doing a little better in regard to his wound. In fact the case has been since we did the prednisone on May 2 for him that we have noticed a little bit of improvement each time we have seen a size wise as well as appearance wise as well as pain wise. I think the prednisone has had a greater effect then the infusion therapy has to be perfectly honest. With that being said the patient also feels significantly better compared to what he was previous. All of this is good news but nonetheless I am still concerned about the fact that again we are really not set up to long-term  manage  him as far as prednisone is concerned. Obviously there are things that you need to be watched I completely understand the risk of prednisone usage as well. 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 With all that being said I do believe that we can give him another round of the prednisone which she is requesting today because of the improvement that he seen since we did that first round. 10/30/2020 upon evaluation today patient's wound actually is showing signs of doing quite well. There does not appear to be any evidence of infection which is great news and overall very pleased with where things stand today. No fevers, chills, nausea, vomiting, or diarrhea. He tells me that the prednisone still has seem to have helped he wonders if we can extend that for just a little bit longer. He did not have the appointment with a dermatologist although he did have an infusion appointment last Friday. That was at [facility]. With that being said he tells me he could not do both that as well as the appointment with the physician on the same day therefore that is can have to be rescheduled. I really want to see if there is anything they feel like that could be done differently to try to help this out as I am not really certain that the infusions are helping significantly here. 11/13/2020 upon evaluation today patient unfortunately appears to be doing somewhat poorly in regard to his wound I feel like this is actually worsening from the standpoint of the pyoderma spreading. I still feel like that he may need something different as far as trying to manage this going forward. Again we did the prednisone unfortunately his blood sugars are not doing so well because of this. Nonetheless I believe that the patient likely needs to try topical steroid. We have done triamcinolone for a while I think going with something stronger such as clobetasol could be beneficial again this is not  something I do lightly I discussed this with the patient that again this does not normally put underneath an occlusive dressing. Nonetheless I think a thin film as such could help with some of the stronger anti-inflammatory effects. We discussed this today. He would like to try to give this a trial for the next couple weeks. I definitely think that is something that we can do. Evaluate7/03/2021 and today patient's wound bed actually showed signs of doing really about the same. There was a little expansion of the size of the wound and that leading edge that we done looking out although the clobetasol does seem to have slowed this down a bit in my opinion. There is just 1 small area that still seems to be progressing based on what I see. Nonetheless I am concerned about the fact this does not seem to be improving if anything seems to be doing a little bit worse. I do not know that the infusions are really helping him much as next infusion is August 5 his appointment with dermatology is July 25. Either way I really think that we need to have a conversation potentially about this and I am actually going to see if I can talk with Dr. Lillia Carmel in order to see where things stand as well. 12/11/2020 upon evaluation today patient appears to be doing worse in regard to his leg ulcer. Unfortunately I just do not think this is making the progress that I would like to see at  this point. Honestly he does have an appointment with dermatology and this is in 2 days. I am wondering what they may have to offer to help with this. Right now what I am seeing is that he is continuing to show signs of worsening little by little. Obviously that is not great at all. Is the exact opposite of what we are looking for. 12/18/2020 upon evaluation today patient appears to be doing a little better in regard to his wound. The dermatologist actually did do some steroid injections into the wound which does seem to have been beneficial in  my opinion. That was on the 25th already this looks a little better to me than last time I saw him. With that being said we did do a culture and this did show that he has Staph aureus noted in abundance in the wound. With that being said I do think that getting him on an oral antibiotic would be appropriate as well. Also think we can compression wrap and this will make a difference as well. 12/28/2020 upon evaluation today patient's wound is actually showing signs of doing much better. I do believe the compression wrap is helping he has a lot of drainage but to be honest I think that the compression is helping to some degree in this regard as well as not draining through which is also good news. No fevers, chills, nausea, vomiting, or diarrhea. 01/04/2021 upon evaluation today patient appears to be doing well with regard to his wound. Overall things seem to be doing quite well. He did have a little bit of reaction to the CarboFlex Sorbact he will be using that any longer. With that being said he is controlled as far as the drainage is concerned overall and seems to be doing quite well. I do not see any signs of active infection at this time which is great news. No fevers, chills, nausea, vomiting, or diarrhea. 01/11/2021 upon evaluation today patient appears to be doing well with regard to his wounds. He has been tolerating the dressing changes without complication. Fortunately there does not appear to be any signs of active infection at this time which is great news. Overall I am extremely pleased with where we stand currently. No fevers, chills, nausea, vomiting, or diarrhea. Where using clobetasol in the wound bed he has a lot of new skin growth which is awesome as well. 01/18/2021 upon evaluation today patient appears to be doing very well in regard to his leg ulcer. He has been tolerating the dressing changes without complication. Fortunately there does not appear to be any signs of active infection  which is great news. In general I think that he is making excellent progress 01/25/2021 upon evaluation today patient appears to be doing well with regard to his wound on the leg. I am actually extremely pleased with where things stand today. There does not appear to be any signs of active infection which is great news and overall I think that we are definitely headed in the appropriate direction based on what I am seeing currently. There does not appear to be any signs of active infection also excellent news. 02/06/2021 upon evaluation today patient appears to be doing well with regard to his wound. Overall visually this is showing signs of significant Griffing, Brandn ([id number]) improvement which is great news. I do not see any signs of active infection systemically which is great even locally I do not think that we are seeing any major complications here. We did  do fluorescence imaging with the [employer] today. The patient does have some odor and drainage noted and again this is something that I think would benefit him to probably come more frequently for nurse visits. 02/19/2021 upon evaluation today patient actually appears to be doing quite well in regard to his wound. He has been tolerating the dressing changes without complication and overall I think that this is making excellent progress. I do not see any evidence of active infection at this point which is great news as well. No fevers, chills, nausea, vomiting, or diarrhea. 10/10; wound is made nice progress healthy granulation with a nice rim of epithelialization which seems to be expanding even from last week he has a deeper area in the inferior part of the more distal part of the wound with not quite as healthy as surface. This area will need to be followed. Using clobetasol and Hydrofera Blue 03/05/2021 upon evaluation today patient appears to be doing very well in regard to his leg ulcer. He has been tolerating dressing changes without  complication. Fortunately there does not appear to be any signs of active infection which is great news and overall I am extremely pleased with where we stand currently. 03/12/2021 upon evaluation today patient appears to be doing well with regard to his wound in fact this is extremely extremely good based on what we are seeing today there does not appear to be any signs of active infection and overall I think that he is doing awesome from the standpoint of healing in general. I am extremely pleased with how things seem to be progressing with regard to this pyoderma. Clobetasol has done wonders for him. I think the compression wrapping has also been of great benefit. 03/19/2021 upon evaluation today patient appears to be doing well with regard to his wound. He is tolerating the dressing changes without complication. In fact I feel like that he is actually making excellent progress at this point based on what I am seeing. No fevers, chills, nausea, vomiting, or diarrhea. 03/26/2021 upon evaluation today patient appears to be doing well with regard to his wound. This again is measuring smaller and looking better. Again the progress is slow but nonetheless continual with what we have been seeing. I do believe that the current plan is doing awesome for him. 04/09/2021 upon evaluation today patient appears to be doing well with regard to his leg ulcer. This is showing signs of excellent improvement the muscle is completely closed over and there does not appear to be any evidence of inflammation at this point his drainage is significantly improved. Overall I think that he would be a good candidate for looking into a skin substitute at this point as well. We will get a look into some approvals in that regard. Potentially TheraSkin as well as Apligraf could both be considered just depending on insurance coverage. 04/16/2021 upon evaluation today patient appears to be doing well at this point. He has been  approved for the Apligraf which we could definitely order although I would like to try to get the TheraSkin approved if at all possible. I did fax notes into them today and I Georgina Peer try to give a call as well. Overall the wound appears to be doing decently well today. 04/20/2021 upon evaluation today patient actually appears to be doing quite well in regard to his wound with that being said we are trying to see what we can do about speeding up the healing process. For that reason  we did discuss the possibility of a skin substitute. We got the Apligraf approved. For that reason we will get a go ahead and see what we can do with the Apligraf at this point. I am still trying to get the TheraSkin approved but I have not heard anything from the insurance company yet I have called and talked to them earlier in the week and to be honest this was about half an hour that I spent on the phone they told me I would hear something within 10-15 business days. 04/27/2021 upon evaluation today patient appears to be doing excellent in regard to his wound. I do believe the Apligraf has been beneficial. With that being said he has had a little bit of increased pain has been a little bit concerned. I do want to go ahead and apply his steroid cream today the clobetasol and then put the Apligraf over I just do not think I want to risk not putting the clobetasol on based on what is been going on here currently. Patient voiced understanding. 05/04/2021 upon evaluation today patient is making excellent improvement. Overall there is definitely decrease in the size of the wound today and I am very pleased in that regard. I do not see any signs of active infection locally nor systemically at this point. No fevers, chills, nausea, vomiting, or diarrhea. 05/11/2021 upon evaluation today patient appears to be doing well with regard to his wound. He has been tolerating Apligraf's without complication and is making excellent progress  this is application #4 today. 12/30; patient here for Apligraf application. Appears to be doing well small wound there has been a lot of healing 05/28/2021 upon evaluation today patient appears to be doing well with regard to his wound. Has been tolerating the dressing changes without complication. Fortunately I do not see any signs of active infection locally nor systemically at this point which is great news. He is done with the Apligraf and the first round and overall this has filled in quite nicely I am not even certain he needs any additional at the moment. I would actually try to go back to clobetasol with [facility] which previously was doing well for him. 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 He is using clobetasol and Hydrofera Blue. Surface area of the wound is smaller and the surface looks very healthy. 06/11/2021 upon evaluation today patient actually appears to be showing signs of good improvement which is great news. Fortunately I do not see any signs of active infection at this time which is also excellent news. I do believe that the patient is doing well with the clobetasol and Hydrofera Blue. He does have some irritation and itching around the rest of the leg will use some triamcinolone over this area. 06/18/2021 upon evaluation today patient appears to be doing well with regard to his wound. He is showing signs of excellent improvement and overall I am extremely pleased with where we stand today. We are moving in the right direction. 06/25/2021 upon evaluation today patient appears to be doing well with regard to his wound. In fact this is doing also not showing signs of excellent improvement overall very pleased with where we stand today. I do not see any evidence of active infection locally or systemically at this time which is also great news. 07/02/2021 upon evaluation patient's wound bed actually showed signs of good  granulation and epithelization at this point. And this is measuring significantly  smaller and overall I am extremely pleased with where we stand today. No fevers, chills, nausea, vomiting, or diarrhea. 07/09/2021 upon evaluation patient's wound bed actually showed signs of good granulation and epithelization at this point. Fortunately there does not appear to be any evidence of active infection locally or systemically which is great news and overall I am extremely pleased with where we ROBERTO, ROMANOSKI ([id number]) stand at this point. 07/16/2021 upon evaluation today patient appears to be doing well with regard to his wound all things considered although it is maybe a little bit larger than what its been. This does have me a little concerned. Actually question about whether anything is changed and initially he told me know. In fact it was not until the end of the visit that he actually did mention that he had missed his infusion in January. This very well may be what is because the difference is also seeing currently. That definitely has seem to been helping more recently. 07/23/2021 upon evaluation today patient's wound is yet again larger despite the switch to a collagen which was not beneficial. Subsequently I am going to at this point check on a couple things. First and foremost I do want to go ahead and do a culture. I think that there is a chance he could have a low-lying infection that may be part of the issue here. Subsequently I am also probably can go ahead and place him on Bactrim DS which I think is a good option and has done well with them in the past. Again if this is an infection that should hopefully help to turn things around. With all that being said I do believe that the patient should hopefully be able to turn this around with reinitiation of the infusion therapy which will be going on this week and subsequently getting on the antibiotic. 3/13; patient presents for follow-up. He has no  issues or complaints today. He states he is currently on oral antibiotics for previous culture done in the office. 08/06/2021 upon evaluation today patient appears to be doing well with regard to his wound. I am actually seeing signs of improvement which is great news. I think that he is on a better track he did receive his infusion as well which is great news and overall I think that this should hopefully get him back on track as far as healing is concerned. He is not having any pain and I do not see any signs of inflammation which is great news. 08/13/2021 upon evaluation today patient appears to be doing excellent in regard to his wound on the leg. He has been tolerating the dressing changes without complication. Fortunately I do not see any signs of infection I do believe he is making good progress here and I think her back on track overall the tissue is greatly improved compared to what we have been. 08/20/2021 upon evaluation today patient appears to be doing excellent in regard to his wound. He has been tolerating the dressing changes without complication. Fortunately I do not see any evidence of infection at this time locally or systemically which is great news and overall very pleased with where we stand. 08-31-2021 upon evaluation today patient appears to be doing excellent in regard to his wound. Overall even with the vacation and extra walking he seems to have really done well. Fortunately I do not see any evidence of active infection locally or systemically which is great news and overall I think we are headed in the  right direction. 09-10-2021 upon evaluation today patient's wound is showing signs of improvement. This is a small improvement but nonetheless we seem to be making progress here. I am very pleased in that regard. There does not appear to be any signs of infection and overall things are doing quite nicely. Upon inspection patient's wound bed actually showed signs of good  granulation and epithelization at this point. There was some irritation around the edges of the wound where the good skin was it almost appears that something was rubbing or else is was due from drainage and irritation. Nonetheless I am going to see about putting a little bit of zinc just around the wound opening in order to protect the skin from being damaged. The patient is in agreement with that plan. 5/8; its been a while since I have seen this wound and it looks considerably better although still with some depth. Per our intake nurse the dimensions have improved patient has been using a thin layer of clobetasol, Hydrofera Blue under 4-layer compression. He appears to be making progress 10-01-2021 upon evaluation today patient appears to be doing well with regard to his wound although it is somewhat stalled I feel like were basically at a standstill here. He has previously had Apligraf which did very well for him to be honest. I do believe that he may benefit from a repeat of the Apligraf. In fact it got so small at that point but I do not think we even used all of the applications that were recommended as this had improved to such a degree. Nonetheless I do believe that the patient currently could benefit from repeat treatment with the Apligraf due to the improvement this that we did see. He is not opposed to this and subsequently working to look into getting approval to reinstitute that treatment. 10-08-2021 upon evaluation today patient appears to be doing well with regard to his wound. Overall I feel like he is doing a little bit better from the standpoint of irritation at this time which is great news. I do not see any signs of active infection locally or systemically which is great news as well. No fevers, chills, nausea, vomiting, or diarrhea. Objective Constitutional Well-nourished and well-hydrated in no acute distress. Vitals Time Taken: 8:09 AM, Height: 71 in, Weight: 338 lbs, BMI:  47.1, Temperature: 98.0 F, Pulse: 82 bpm, Respiratory Rate: 18 breaths/min, Blood Pressure: 133/96 mmHg. Respiratory normal breathing without difficulty. TAKAHIRO, GODINHO ([id number]) Psychiatric this patient is able to make decisions and demonstrates good insight into disease process. Alert and Oriented x 3. pleasant and cooperative. General Notes: Upon evaluation patient's wound bed actually showed signs of good granulation and epithelization at this point. Fortunately I do not see any evidence of infection I do think we need to do something to jumpstart things here though and I do believe Apligraf would be appropriate for the patient. He is in agreement with that plan it is covered he has met his out-of-pocket as well as his deductible for the year. For that reason I do believe that we can go ahead and proceed with the Apligraf which has done well for him in the past. Hopefully we can get this wound closed this time completely. Integumentary (Hair, Skin) Wound #1 status is Open. Original cause of wound was Gradually Appeared. The date acquired was: 11/18/2015. The wound has been in treatment 252 weeks. The wound is located on the Left,Lateral Lower Leg. The wound measures 3cm length x 0.8cm width x  0.3cm depth; 1.885cm^2 area and 0.565cm^3 volume. There is Fat Layer (Subcutaneous Tissue) exposed. There is a medium amount of serosanguineous drainage noted. There is large (67-100%) red granulation within the wound bed. There is a small (1-33%) amount of necrotic tissue within the wound bed including Adherent Slough. 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 There was a Four Layer Compression Therapy Procedure with a pre-treatment ABI of 1.2 by [name], RN. Post procedure Diagnosis Wound #1: Same as Pre-Procedure Plan Follow-up Appointments: Return Appointment in 1 week. Nurse Visit as needed - twice a week Bathing/ Shower/ Hygiene: Clean wound with Normal Saline or wound cleanser. May shower with wound dressing protected with water repellent cover or cast protector. No tub bath. Edema Control - Lymphedema / Segmental Compressive Device / Other: Optional: One layer of unna paste to top of compression wrap (to act as an anchor). - He needs this to hold it up Elevate, Exercise Daily and Avoid Standing for Long Periods of Time. Elevate legs to the level of the heart and pump ankles as often as possible Elevate leg(s) parallel to the floor when sitting. WOUND #1: - Lower Leg Wound Laterality: Left, Lateral Cleanser: Wound Cleanser 3 x Per Week/30 Days Discharge Instructions: Wash your hands with soap and water. Remove old dressing, discard into plastic bag and place into trash. Cleanse the wound with Wound Cleanser prior to applying a clean dressing using gauze sponges, not tissues or cotton balls. Do not scrub or use excessive force. Pat dry using gauze sponges, not tissue or cotton balls. Peri-Wound Care: Desitin Maximum Strength Ointment 4 (oz) 3 x Per Week/30 Days Discharge Instructions: Apply around wound edges to avoid maceration Topical: Clobetasol Propionate ointment 0.05%, 60 (g) tube 3 x Per Week/30 Days Discharge Instructions: Pt to bring to appt- applied to wound bed Primary Dressing: Hydrofera Blue Ready Transfer Foam, 4x5 (in/in) 3 x Per Week/30 Days Discharge Instructions: Apply Hydrofera Blue Ready to wound bed as directed Secondary Dressing: (NON-BORDER) Zetuvit Plus Silicone NON-BORDER 5x5 (in/in) 3 x Per Week/30 Days Mayden, Abdias ([id number]) Compression Wrap: Medichoice 4 layer Compression System, 35-40 mmHG  (Generic) 3 x Per Week/30 Days Discharge Instructions: Apply multi-layer wrap as directed. 1. I am good recommend for the time being we continue with the [facility] along with the clobetasol I think that is doing decently well. 2. Also can recommend that we have the patient continue to monitor for any signs of infection obviously right now he is doing quite well there is no pain. 3. I do believe that Apligraf would be appropriate for him and we will go ahead and see about getting that started as soon as possible. He is in agreement with that plan. We will see patient back for reevaluation in 1 week here in the clinic. If anything worsens or changes patient will contact our office for additional recommendations. Electronic Signature(s) Signed: 10/08/2021 9:13:18 AM By: Worthy Keeler PA-C Entered By: Worthy Keeler on 10/08/2021 09:13:18 TYRIAN, PEART ([id number]) -------------------------------------------------------------------------------- SuperBill Details Patient Name: [name] Date of Service: 10/08/2021 Medical Record Number: [id number] Patient Account Number: [id number] Date of Birth/Sex: 08-Dec-1980 (40 y.o. M) Treating RN: [name] Primary Care Provider: [name] Other Clinician: Referring  Provider: [name] Treating Provider/Extender: [name] Cliche in Treatment: 252 Diagnosis Coding ICD-10 Codes Code Description I87.2 Venous insufficiency (chronic) (peripheral) L97.222 Non-pressure chronic ulcer of left calf with fat layer exposed E11.622 Type 2 diabetes mellitus with other skin ulcer L88 Pyoderma gangrenosum F17.208 Nicotine dependence, unspecified, with other nicotine-induced disorders Facility Procedures CPT4 Code: 51898421 Description: (Facility Use Only) [phone] - [place] L[facility] LT LEG Modifier: Quantity: 1 Physician Procedures CPT4 Code: 8867737 Description: 36681 - WC PHYS LEVEL 4 - EST PT Modifier: Quantity: 1 CPT4  Code: Description: ICD-10 Diagnosis Description I87.2 Venous insufficiency (chronic) (peripheral) L97.222 Non-pressure chronic ulcer of left calf with fat layer exposed E11.622 Type 2 diabetes mellitus with other skin ulcer L88 Pyoderma gangrenosum Modifier: Quantity: Electronic Signature(s) Signed: 10/08/2021 9:13:51 AM By: Worthy Keeler PA-C Entered By: Worthy Keeler on 10/08/2021 09:13:51

## 2021-10-08 NOTE — Progress Notes (Signed)
[name], DAILY ([id number]) Visit Report for 10/08/2021 Arrival Information Details Patient Name: [name], [name] Date of Service: 10/08/2021 8:15 AM Medical Record Number: [id number] Patient Account Number: [id number] Date of Birth/Sex: 1980/10/14 (41 y.o. M) Treating RN: [name] Primary Care Allyssa Abruzzese: [name] Other Clinician: Referring Mahlani Berninger: [name] Treating Vadhir Mcnay/Extender: [name] Cliche in Treatment: 5 Visit Information History Since Last Visit Added or deleted any medications: No Patient Arrived: Ambulatory Has Dressing in Place as Prescribed: Yes Arrival Time: 08:09 Has Compression in Place as Prescribed: Yes Accompanied By: self Pain Present Now: No Transfer Assistance: None Patient Identification Verified: Yes Secondary Verification Process Completed: Yes Patient Requires Transmission-Based No Precautions: Patient Has Alerts: Yes Patient Alerts: Patient has reaction to silver dressings. Electronic Signature(s) Signed: 10/08/2021 4:42:58 PM By: [name], [name], RN, [name], [name], [name] Entered By: [name], [name], RN, [name], [name] on 10/08/2021 08:09:37 SANDY, BLOUCH ([id number]) -------------------------------------------------------------------------------- Compression Therapy Details Patient Name: [name] Date of Service: 10/08/2021 8:15 AM Medical Record Number: [id number] Patient Account Number: [id number] Date of Birth/Sex: Oct 01, 1980 (40 y.o. M) Treating RN: [name] Primary Care Hendrick Pavich: [name] Other Clinician: Referring Denilson Salminen: [name] Treating Jenniger Figiel/Extender: [name] Cliche in Treatment: 252 Compression Therapy Performed for Wound Assessment: Wound #1 Left,Lateral Lower Leg Performed By: Clinician [name], RN Compression Type: Four Layer Pre Treatment ABI: 1.2 Post Procedure Diagnosis Same as Pre-procedure Electronic Signature(s) Signed: 10/08/2021 4:42:58 PM By: [name], [name], RN, [name], [name], [name] Entered By:  [name], [name], RN, [name], [name] on 10/08/2021 08:30:31 [name] ([id number]) -------------------------------------------------------------------------------- Encounter Discharge Information Details Patient Name: [name] Date of Service: 10/08/2021 8:15 AM Medical Record Number: [id number] Patient Account Number: [id number] Date of Birth/Sex: [birth year] (40 y.o. M) Treating RN: Alycia Rossetti Primary Care Dayten Juba: [name] Other Clinician: Referring Kaidan Spengler: [name] Treating Esterlene Atiyeh/Extender: [name] Cliche in Treatment: 252 Encounter Discharge Information Items Discharge Condition: Stable Ambulatory Status: Ambulatory Discharge Destination: Home Transportation: Private Auto Accompanied By: self Schedule Follow-up Appointment: Yes Clinical Summary of Care: Electronic Signature(s) Signed: 10/08/2021 4:29:51 PM By: Alycia Rossetti Entered By: Alycia Rossetti on 10/08/2021 09:13:09 [place], [name] ([id number]) -------------------------------------------------------------------------------- Lower Extremity Assessment Details Patient Name: [name] Date of Service: 10/08/2021 8:15 AM Medical Record Number: [id number] Patient Account Number: [id number] Date of Birth/Sex: [birth year] (40 y.o. M) Treating RN: [name] Primary Care Dmauri Rosenow: [name] Other Clinician: Referring Jennipher Weatherholtz: [name] Treating Sandy Haye/Extender: [name] Cliche in Treatment: 252 Edema Assessment Assessed: [Left: Yes] [Right: No] Edema: [Left: Ye] [Right: s] Calf Left: Right: Point of Measurement: 35 cm From Medial Instep 47.5 cm Ankle Left: Right: Point of Measurement: 10 cm From Medial Instep 29.5 cm Vascular Assessment Pulses: Dorsalis Pedis Palpable: [Left:Yes] Electronic Signature(s) Signed: 10/08/2021 4:42:58 PM By: [name], [name], RN, [name], [name], [name] Entered By: [name], [name], RN, [name], [name] on 10/08/2021 08:19:21 [name]  ([id number]) -------------------------------------------------------------------------------- Multi Wound Chart Details Patient Name: [name] Date of Service: 10/08/2021 8:15 AM Medical Record Number: [id number] Patient Account Number: [id number] Date of Birth/Sex: Aug 04, 1980 (40 y.o. M) Treating RN: [name] Primary Care Tasheena Wambolt: [name] Other Clinician: Referring Eliyanna Ault: [name] Treating Naysha Sholl/Extender: [name] Cliche in Treatment: 252 Vital Signs Height(in): 71 Pulse(bpm): 72 Weight(lbs): 338 Blood Pressure(mmHg): 133/96 Body Mass Index(BMI): 47.1 Temperature(F): 98.0 Respiratory Rate(breaths/min): 18 Photos: [N/A:N/A] Wound Location: Left, Lateral Lower Leg N/A N/A Wounding Event: Gradually Appeared N/A N/A Primary Etiology: Pyoderma N/A N/A Comorbid History: Sleep Apnea, Hypertension, Colitis N/A N/A Date Acquired: 11/18/2015 N/A N/A Weeks of Treatment: 371  N/A N/A Wound Status: Open N/A N/A Wound Recurrence: No N/A N/A Measurements L x W x D (cm) 3x0.8x0.3 N/A N/A Area (cm) : 1.885 N/A N/A Volume (cm) : 0.565 N/A N/A % Reduction in Area: 61.60% N/A N/A % Reduction in Volume: 85.60% N/A N/A Classification: Full Thickness With Exposed N/A N/A Support Structures Exudate Amount: Medium N/A N/A Exudate Type: Serosanguineous N/A N/A Exudate Color: red, brown N/A N/A Granulation Amount: Large (67-100%) N/A N/A Granulation Quality: Red N/A N/A Necrotic Amount: Small (1-33%) N/A N/A Exposed Structures: Fat Layer (Subcutaneous Tissue): N/A N/A Yes Procedures Performed: Compression Therapy N/A N/A Treatment Notes Electronic Signature(s) Signed: 10/08/2021 4:42:58 PM By: [name], [name], RN, [name], [name], [name] Entered By: [name], [name], RN, [name], [name] on 10/08/2021 08:47:37 [name] ([id number]) -------------------------------------------------------------------------------- Multi-Disciplinary Care Plan Details Patient Name: [name] Date of  Service: 10/08/2021 8:15 AM Medical Record Number: [id number] Patient Account Number: [id number] Date of Birth/Sex: 28-Sep-1980 (40 y.o. M) Treating RN: [name] Primary Care Kymora Sciara: [name] Other Clinician: Referring Makaylyn Sinyard: [name] Treating Mckoy Bhakta/Extender: [name] Cliche in Treatment: Hodge reviewed with physician Active Inactive Electronic Signature(s) Signed: 10/08/2021 4:42:58 PM By: [name], [name], RN, [name], [name], [name] Entered By: [name], [name], RN, [name], [name] on 10/08/2021 08:47:23 KEMUEL, BUCHMANN ([id number]) -------------------------------------------------------------------------------- Pain Assessment Details Patient Name: [name] Date of Service: 10/08/2021 8:15 AM Medical Record Number: [id number] Patient Account Number: [id number] Date of Birth/Sex: [birth year] (40 y.o. M) Treating RN: [name] Primary Care Bennye Nix: [name] Other Clinician: Referring Tanyah Debruyne: [name] Treating Delois Tolbert/Extender: [name] Cliche in Treatment: 252 Active Problems Location of Pain Severity and Description of Pain Patient Has Paino No Site Locations Pain Management and Medication Current Pain Management: Notes Patient denies pain at this time. Electronic Signature(s) Signed: 10/08/2021 4:42:58 PM By: [name], [name], RN, [name], [name], [name] Entered By: [name], [name], RN, [name], [name] on 10/08/2021 08:10:25 LAURIS, SERVISS ([id number]) -------------------------------------------------------------------------------- Patient/Caregiver Education Details Patient Name: [name] Date of Service: 10/08/2021 8:15 AM Medical Record Number: [id number] Patient Account Number: [id number] Date of Birth/Gender: December 13, 1980 (40 y.o. M) Treating RN: [name] Primary Care Physician: [name] Other Clinician: Referring Physician: [name] Treating Physician/Extender: [name] Cliche in Treatment: 19 Education  Assessment Education Provided To: Patient Education Topics Provided Venous: Handouts: Controlling Swelling with Multilayered Compression Wraps Methods: Demonstration, Explain/Verbal Responses: State content correctly Wound/Skin Impairment: Electronic Signature(s) Signed: 10/08/2021 4:42:58 PM By: [name], [name], RN, [name], [name], [name] Entered By: [name], [name], RN, [name], [name] on 10/08/2021 08:52:05 [name] ([id number]) -------------------------------------------------------------------------------- Wound Assessment Details Patient Name: [name] Date of Service: 10/08/2021 8:15 AM Medical Record Number: [id number] Patient Account Number: [id number] Date of Birth/Sex: [birth year] (40 y.o. M) Treating RN: [name] Primary Care Brittiny Levitz: [name] Other Clinician: Referring Danyah Guastella: [name] Treating Clyda Smyth/Extender: [name] Cliche in Treatment: 252 Wound Status Wound Number: 1 Primary Etiology: Pyoderma Wound Location: Left, Lateral Lower Leg Wound Status: Open Wounding Event: Gradually Appeared Comorbid History: Sleep Apnea, Hypertension, Colitis Date Acquired: 11/18/2015 Weeks Of Treatment: 252 Clustered Wound: No Photos Wound Measurements Length: (cm) 3 Width: (cm) 0.8 Depth: (cm) 0.3 Area: (cm) 1.885 Volume: (cm) 0.565 % Reduction in Area: 61.6% % Reduction in Volume: 85.6% Wound Description Classification: Full Thickness With Exposed Support Structure Exudate Amount: Medium Exudate Type: Serosanguineous Exudate Color: red, brown s Wound Bed Granulation Amount: Large (67-100%) Exposed Structure Granulation Quality: Red Fat Layer (Subcutaneous Tissue) Exposed: Yes Necrotic Amount: Small (1-33%) Necrotic Quality: Adherent  Slough Treatment Notes Wound #1 (Lower Leg) Wound Laterality: Left, Lateral Cleanser Wound Cleanser Discharge Instruction: Wash your hands with soap and water. Remove old dressing, discard into plastic bag and place into  trash. Cleanse the wound with Wound Cleanser prior to applying a clean dressing using gauze sponges, not tissues or cotton balls. Do not scrub or use excessive force. Pat dry using gauze sponges, not tissue or cotton balls. Peri-Wound Care Desitin Maximum Strength Ointment 4 (oz) Kalis, [place] ([id number]) Discharge Instruction: Apply around wound edges to avoid maceration Topical Clobetasol Propionate ointment 0.05%, 60 (g) tube Discharge Instruction: Pt to bring to appt- applied to wound bed Primary Dressing Hydrofera Blue Ready Transfer Foam, 4x5 (in/in) Discharge Instruction: Apply Hydrofera Blue Ready to wound bed as directed Secondary Dressing (NON-BORDER) Zetuvit Plus Silicone NON-BORDER 5x5 (in/in) Secured With Compression Wrap Medichoice 4 layer Compression System, 35-40 mmHG Discharge Instruction: Apply multi-layer wrap as directed. Compression Stockings Environmental education officer) Signed: 10/08/2021 4:42:58 PM By: [name], [name], RN, [name], [name], [name] Entered By: [name], [name], RN, [name], [name] on 10/08/2021 08:18:30 TAUNO, FALOTICO ([id number]) -------------------------------------------------------------------------------- [place] Details Patient Name: [name] Date of Service: 10/08/2021 8:15 AM Medical Record Number: [id number] Patient Account Number: [id number] Date of Birth/Sex: Jul 13, 1980 (40 y.o. M) Treating RN: [name] Primary Care Desmon Hitchner: [name] Other Clinician: Referring Courtni Balash: [name] Treating Shatarra Wehling/Extender: [name] Cliche in Treatment: 252 Vital Signs Time Taken: 08:09 Temperature (F): 98.0 Height (in): 71 Pulse (bpm): 82 Weight (lbs): 338 Respiratory Rate (breaths/min): 18 Body Mass Index (BMI): 47.1 Blood Pressure (mmHg): 133/96 Reference Range: 80 - 120 mg / dl Electronic Signature(s) Signed: 10/08/2021 4:42:58 PM By: [name], [name], RN, [name], [name], [name] Entered By: [name], [name], RN, [name], [name] on 10/08/2021 08:09:53

## 2021-10-08 NOTE — Progress Notes (Signed)
EILEEN, CROSWELL ([id number]) Visit Report for 10/05/2021 Physician Orders Details Patient Name: [name], [name] Date of Service: 10/05/2021 8:00 AM Medical Record Number: [id number] Patient Account Number: [id number] Date of Birth/Sex: July 23, 1980 (41 y.o. M) Treating RN: [name] Primary Care Provider: [name] Other Clinician: Referring Provider: [name] Treating Provider/Extender: [name] Cliche in Treatment: [phone] Verbal / Phone Orders: No Diagnosis Coding Follow-up Appointments o Return Appointment in 1 week. o Nurse Visit as needed - twice a week Bathing/ Shower/ Hygiene o Clean wound with Normal Saline or wound cleanser. o May shower with wound dressing protected with water repellent cover or cast protector. o No tub bath. Edema Control - Lymphedema / Segmental Compressive Device / Other o Optional: One layer of unna paste to top of compression wrap (to act as an anchor). - He needs this to hold it up o Elevate, Exercise Daily and Avoid Standing for Long Periods of Time. o Elevate legs to the level of the heart and pump ankles as often as possible o Elevate leg(s) parallel to the floor when sitting. Wound Treatment Wound #1 - Lower Leg Wound Laterality: Left, Lateral Cleanser: Wound Cleanser 3 x Per Week/30 Days Discharge Instructions: Wash your hands with soap and water. Remove old dressing, discard into plastic bag and place into trash. Cleanse the wound with Wound Cleanser prior to applying a clean dressing using gauze sponges, not tissues or cotton balls. Do not scrub or use excessive force. Pat dry using gauze sponges, not tissue or cotton balls. Peri-Wound Care: Desitin Maximum Strength Ointment 4 (oz) 3 x Per Week/30 Days Discharge Instructions: Apply around wound edges to avoid maceration Topical: Clobetasol Propionate ointment 0.05%, 60 (g) tube 3 x Per Week/30 Days Discharge Instructions: Pt to bring to appt- applied to wound bed Primary  Dressing: Hydrofera Blue Ready Transfer Foam, 4x5 (in/in) 3 x Per Week/30 Days Discharge Instructions: Apply Hydrofera Blue Ready to wound bed as directed Secondary Dressing: (NON-BORDER) Zetuvit Plus Silicone NON-BORDER 5x5 (in/in) 3 x Per Week/30 Days Compression Wrap: Medichoice 4 layer Compression System, 35-40 mmHG (Generic) 3 x Per Week/30 Days Discharge Instructions: Apply multi-layer wrap as directed. Electronic Signature(s) Signed: 10/05/2021 1:47:08 PM By: Worthy Keeler PA-C Signed: 10/08/2021 4:42:58 PM By: [name] [name], RN, [name], [name], [name] Entered By: [name], [name], RN, [name], [name] on 10/05/2021 08:26:35 KAL, CHAIT ([id number]) -------------------------------------------------------------------------------- SuperBill Details Patient Name: [name] Date of Service: 10/05/2021 Medical Record Number: [id number] Patient Account Number: [id number] Date of Birth/Sex: [birth year] (40 y.o. M) Treating RN: [name] Primary Care Provider: [name] Other Clinician: Referring Provider: [name] Treating Provider/Extender: [name] Cliche in Treatment: 252 Diagnosis Coding ICD-10 Codes Code Description I87.2 Venous insufficiency (chronic) (peripheral) L97.222 Non-pressure chronic ulcer of left calf with fat layer exposed E11.622 Type 2 diabetes mellitus with other skin ulcer L88 Pyoderma gangrenosum F17.208 Nicotine dependence, unspecified, with other nicotine-induced disorders Facility Procedures CPT4 Code: 30076226 Description: (Facility Use Only) [place] LT LEG Modifier: Quantity: 1 Electronic Signature(s) Signed: 10/05/2021 1:47:08 PM By: Worthy Keeler PA-C Signed: 10/08/2021 4:42:58 PM By: [name], [name], RN, [name], [name], [name] Entered By: [name], [name], RN, [name], [name] on 10/05/2021 [id number]

## 2021-10-09 DIAGNOSIS — E1165 Type 2 diabetes mellitus with hyperglycemia: Secondary | ICD-10-CM

## 2021-10-10 DIAGNOSIS — I872 Venous insufficiency (chronic) (peripheral): Secondary | ICD-10-CM | POA: Diagnosis not present

## 2021-10-10 NOTE — Progress Notes (Signed)
MEHKI, KLUMPP ([id number]) Visit Report for 10/10/2021 Arrival Information Details Patient Name: [name], [name] Date of Service: 10/10/2021 8:00 AM Medical Record Number: [id number] Patient Account Number: [id number] Date of Birth/Sex: 1980/10/30 (41 y.o. M) Treating RN: Levora Dredge Primary Care Cythina Mickelsen: [name] Other Clinician: Referring Dao Mearns: [name] Treating Uzziel Russey/Extender: Yaakov Guthrie in Treatment: 253 Visit Information History Since Last Visit Added or deleted any medications: No Patient Arrived: Ambulatory Any new allergies or adverse reactions: No Arrival Time: 08:02 Had a fall or experienced change in No Accompanied By: self activities of daily living that may affect Transfer Assistance: None risk of falls: Patient Identification Verified: Yes Hospitalized since last visit: No Secondary Verification Process Completed: Yes Has Dressing in Place as Prescribed: Yes Patient Requires Transmission-Based No Has Compression in Place as Prescribed: Yes Precautions: Pain Present Now: No Patient Has Alerts: Yes Patient Alerts: Patient has reaction to silver dressings. Electronic Signature(s) Signed: 10/10/2021 4:35:14 PM By: Levora Dredge Entered By: Levora Dredge on 10/10/2021 08:02:33 [name], [name] ([id number]) -------------------------------------------------------------------------------- Clinic Level of Care Assessment Details Patient Name: [name], [name] Date of Service: 10/10/2021 8:00 AM Medical Record Number: [id number] Patient Account Number: [id number] Date of Birth/Sex: 12/03/1980 (40 y.o. M) Treating RN: Levora Dredge Primary Care Avina Eberle: [name] Other Clinician: Referring Laurianne Floresca: [name] Treating Haidy Kackley/Extender: Yaakov Guthrie in Treatment: 253 Clinic Level of Care Assessment Items TOOL 1 Quantity Score []  - Use when EandM and Procedure is performed on INITIAL visit 0 ASSESSMENTS - Nursing  Assessment / Reassessment []  - General Physical Exam (combine w/ comprehensive assessment (listed just below) when performed on new 0 pt. evals) []  - 0 Comprehensive Assessment (HX, ROS, Risk Assessments, Wounds Hx, etc.) ASSESSMENTS - Wound and Skin Assessment / Reassessment []  - Dermatologic / Skin Assessment (not related to wound area) 0 ASSESSMENTS - Ostomy and/or Continence Assessment and Care []  - Incontinence Assessment and Management 0 []  - 0 Ostomy Care Assessment and Management (repouching, etc.) PROCESS - Coordination of Care []  - Simple Patient / Family Education for ongoing care 0 []  - 0 Complex (extensive) Patient / Family Education for ongoing care []  - 0 Staff obtains Programmer, systems, Records, Test Results / Process Orders []  - 0 Staff telephones HHA, Nursing Homes / Clarify orders / etc []  - 0 Routine Transfer to another Facility (non-emergent condition) []  - 0 Routine Hospital Admission (non-emergent condition) []  - 0 New Admissions / Biomedical engineer / Ordering NPWT, Apligraf, etc. []  - 0 Emergency Hospital Admission (emergent condition) PROCESS - Special Needs []  - Pediatric / Minor Patient Management 0 []  - 0 Isolation Patient Management []  - 0 Hearing / Language / Visual special needs []  - 0 Assessment of Community assistance (transportation, D/C planning, etc.) []  - 0 Additional assistance / Altered mentation []  - 0 Support Surface(s) Assessment (bed, cushion, seat, etc.) INTERVENTIONS - Miscellaneous []  - External ear exam 0 []  - 0 Patient Transfer (multiple staff / Civil Service fast streamer / Similar devices) []  - 0 Simple Staple / Suture removal (25 or less) []  - 0 Complex Staple / Suture removal (26 or more) []  - 0 Hypo/Hyperglycemic Management (do not check if billed separately) []  - 0 Ankle / Brachial Index (ABI) - do not check if billed separately Has the patient been seen at the hospital within the last three years: Yes Total Score: 0 Level Of  Care: ____ [name] ([id number]) Electronic Signature(s) Signed: 10/10/2021 4:35:14 PM By: Levora Dredge Entered By: Levora Dredge on 10/10/2021 08:22:26 [name], [name] ([id number]) --------------------------------------------------------------------------------  Compression Therapy Details Patient Name: [name], [name] Date of Service: 10/10/2021 8:00 AM Medical Record Number: [id number] Patient Account Number: [id number] Date of Birth/Sex: Feb 16, 1981 (40 y.o. M) Treating RN: Levora Dredge Primary Care Allon Costlow: [name] Other Clinician: Referring Apolinar Bero: [name] Treating Zaeden Lastinger/Extender: Yaakov Guthrie in Treatment: 253 Compression Therapy Performed for Wound Assessment: Wound #1 Left,Lateral Lower Leg Performed By: Clinician Levora Dredge, RN Compression Type: Four Layer Pre Treatment ABI: 1.2 Electronic Signature(s) Signed: 10/10/2021 4:35:14 PM By: Levora Dredge Entered By: Levora Dredge on 10/10/2021 08:09:00 [name], [name] ([id number]) -------------------------------------------------------------------------------- Encounter Discharge Information Details Patient Name: [name] Date of Service: 10/10/2021 8:00 AM Medical Record Number: [id number] Patient Account Number: [id number] Date of Birth/Sex: 1980/11/05 (40 y.o. M) Treating RN: Levora Dredge Primary Care Khai Arrona: [name] Other Clinician: Referring Merline Perkin: [name] Treating Shanica Castellanos/Extender: Yaakov Guthrie in Treatment: [phone] Encounter Discharge Information Items Discharge Condition: Stable Ambulatory Status: Ambulatory Discharge Destination: Home Transportation: Private Auto Accompanied By: self Schedule Follow-up Appointment: Yes Clinical Summary of Care: Patient Declined Electronic Signature(s) Signed: 10/10/2021 4:35:14 PM By: Levora Dredge Entered By: Levora Dredge on 10/10/2021 08:22:16 [name], [name]  ([id number]) -------------------------------------------------------------------------------- Wound Assessment Details Patient Name: [name] Date of Service: 10/10/2021 8:00 AM Medical Record Number: [id number] Patient Account Number: [id number] Date of Birth/Sex: 1981/02/25 (40 y.o. M) Treating RN: Levora Dredge Primary Care Augustin Bun: [name] Other Clinician: Referring Jacqulyne Gladue: [name] Treating Calib Wadhwa/Extender: Yaakov Guthrie in Treatment: 253 Wound Status Wound Number: 1 Primary Etiology: Pyoderma Wound Location: Left, Lateral Lower Leg Wound Status: Open Wounding Event: Gradually Appeared Comorbid History: Sleep Apnea, Hypertension, Colitis Date Acquired: 11/18/2015 Weeks Of Treatment: 253 Clustered Wound: No Wound Measurements Length: (cm) 3 Width: (cm) 0.8 Depth: (cm) 0.3 Area: (cm) 1.885 Volume: (cm) 0.565 % Reduction in Area: 61.6% % Reduction in Volume: 85.6% Epithelialization: Small (1-33%) Tunneling: No Undermining: No Wound Description Classification: Full Thickness With Exposed Support Structures Exudate Amount: Medium Exudate Type: Serosanguineous Exudate Color: red, brown Foul Odor After Cleansing: No Slough/Fibrino No Wound Bed Granulation Amount: Large (67-100%) Exposed Structure Granulation Quality: Red Fat Layer (Subcutaneous Tissue) Exposed: Yes Necrotic Amount: Small (1-33%) Necrotic Quality: Adherent Slough Treatment Notes Wound #1 (Lower Leg) Wound Laterality: Left, Lateral Cleanser Wound Cleanser Discharge Instruction: Wash your hands with soap and water. Remove old dressing, discard into plastic bag and place into trash. Cleanse the wound with Wound Cleanser prior to applying a clean dressing using gauze sponges, not tissues or cotton balls. Do not scrub or use excessive force. Pat dry using gauze sponges, not tissue or cotton balls. Peri-Wound Care Desitin Maximum Strength Ointment 4 (oz) Discharge  Instruction: Apply around wound edges to avoid maceration Topical Clobetasol Propionate ointment 0.05%, 60 (g) tube Discharge Instruction: Pt to bring to appt- applied to wound bed Primary Dressing Hydrofera Blue Ready Transfer Foam, 4x5 (in/in) Discharge Instruction: Apply Hydrofera Blue Ready to wound bed as directed Secondary Dressing (NON-BORDER) Zetuvit Plus Silicone NON-BORDER 5x5 (in/in) Secured With Compression Wrap Trimpe, Jalynn ([id number]) Medichoice 4 layer Compression System, 35-40 mmHG Discharge Instruction: Apply multi-layer wrap as directed. Compression Stockings Add-Ons Electronic Signature(s) Signed: 10/10/2021 4:35:14 PM By: Levora Dredge Entered By: Levora Dredge on 10/10/2021 08:08:40

## 2021-10-10 NOTE — Progress Notes (Signed)
[name], [name] ([id number]) Visit Report for 10/10/2021 Physician Orders Details Patient Name: [name], [name] Date of Service: 10/10/2021 8:00 AM Medical Record Number: [id number] Patient Account Number: [id number] Date of Birth/Sex: 1980-06-20 (41 y.o. M) Treating RN: Levora Dredge Primary Care Provider: [name] Other Clinician: Referring Provider: [name] Treating Provider/Extender: Yaakov Guthrie in Treatment: [phone] Verbal / Phone Orders: No Diagnosis Coding Follow-up Appointments o Return Appointment in 1 week. o Nurse Visit as needed - twice a week Bathing/ Shower/ Hygiene o Clean wound with Normal Saline or wound cleanser. o May shower with wound dressing protected with water repellent cover or cast protector. o No tub bath. Edema Control - Lymphedema / Segmental Compressive Device / Other o Optional: One layer of unna paste to top of compression wrap (to act as an anchor). - He needs this to hold it up o Elevate, Exercise Daily and Avoid Standing for Long Periods of Time. o Elevate legs to the level of the heart and pump ankles as often as possible o Elevate leg(s) parallel to the floor when sitting. Wound Treatment Wound #1 - Lower Leg Wound Laterality: Left, Lateral Cleanser: Wound Cleanser 3 x Per Week/30 Days Discharge Instructions: Wash your hands with soap and water. Remove old dressing, discard into plastic bag and place into trash. Cleanse the wound with Wound Cleanser prior to applying a clean dressing using gauze sponges, not tissues or cotton balls. Do not scrub or use excessive force. Pat dry using gauze sponges, not tissue or cotton balls. Peri-Wound Care: Desitin Maximum Strength Ointment 4 (oz) 3 x Per Week/30 Days Discharge Instructions: Apply around wound edges to avoid maceration Topical: Clobetasol Propionate ointment 0.05%, 60 (g) tube 3 x Per Week/30 Days Discharge Instructions: Pt to bring to appt- applied to wound  bed Primary Dressing: Hydrofera Blue Ready Transfer Foam, 4x5 (in/in) 3 x Per Week/30 Days Discharge Instructions: Apply Hydrofera Blue Ready to wound bed as directed Secondary Dressing: (NON-BORDER) Zetuvit Plus Silicone NON-BORDER 5x5 (in/in) 3 x Per Week/30 Days Compression Wrap: Medichoice 4 layer Compression System, 35-40 mmHG (Generic) 3 x Per Week/30 Days Discharge Instructions: Apply multi-layer wrap as directed. Electronic Signature(s) Signed: 10/10/2021 2:24:38 PM By: Kalman Shan DO Signed: 10/10/2021 4:35:14 PM By: Levora Dredge Entered By: Levora Dredge on 10/10/2021 08:21:28 RIPLEY, BOGOSIAN ([id number]) -------------------------------------------------------------------------------- SuperBill Details Patient Name: [name] Date of Service: 10/10/2021 Medical Record Number: [id number] Patient Account Number: [id number] Date of Birth/Sex: May 22, 1980 (41 y.o. 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

## 2021-10-11 MED ORDER — TRULICITY 1.5 MG/0.5ML ~~LOC~~ SOAJ: 1.5000 mg | SUBCUTANEOUS | 0 refills | Status: DC

## 2021-10-12 DIAGNOSIS — I872 Venous insufficiency (chronic) (peripheral): Secondary | ICD-10-CM | POA: Diagnosis not present

## 2021-10-12 NOTE — Progress Notes (Signed)
CAIDON, FOTI ([id number]) Visit Report for 10/12/2021 Chief Complaint Document Details Patient Name: [name], [name] Date of Service: 10/12/2021 8:00 AM Medical Record Number: [id number] Patient Account Number: [id number] Date of Birth/Sex: 01-08-1981 (41 y.o. M) Treating RN: [name] Primary Care Provider: [name] Other Clinician: Massie Kluver Referring Provider: [name] Treating Provider/Extender: [name] Cliche in Treatment: 253 Information Obtained from: Patient Chief Complaint He is here in follow up evaluation for LLE pyoderma ulcer Electronic Signature(s) Signed: 10/12/2021 8:18:59 AM By: Worthy Keeler PA-C Entered By: Worthy Keeler on 10/12/2021 08:18:59 JAYMIE, MISCH ([id number]) -------------------------------------------------------------------------------- Problem List Details Patient Name: [name] Date of Service: 10/12/2021 8:00 AM Medical Record Number: [id number] Patient Account Number: [id number] Date of Birth/Sex: 04/19/1981 (40 y.o. M) Treating RN: [name] Primary Care Provider: [name] Other Clinician: Massie Kluver Referring Provider: [name] Treating Provider/Extender: [name] Cliche in Treatment: [phone] Active Problems ICD-10 Encounter Code Description Active Date MDM Diagnosis I87.2 Venous insufficiency (chronic) (peripheral) 12/04/2016 No Yes L97.222 Non-pressure chronic ulcer of left calf with fat layer exposed 12/04/2016 No Yes E11.622 Type 2 diabetes mellitus with other skin ulcer 04/09/2021 No Yes L88 Pyoderma gangrenosum 03/26/2017 No Yes F17.208 Nicotine dependence, unspecified, with other nicotine-induced disorders 04/24/2020 No Yes Inactive Problems ICD-10 Code Description Active Date Inactive Date L97.213 Non-pressure chronic ulcer of right calf with necrosis of muscle 04/02/2017 04/02/2017 Resolved Problems ICD-10 Code Description Active Date Resolved Date L97.321 Non-pressure chronic ulcer of  left ankle limited to breakdown of skin 10/08/2019 10/08/2019 L03.116 Cellulitis of left lower limb 05/24/2019 05/24/2019 Electronic Signature(s) Signed: 10/12/2021 8:18:53 AM By: Worthy Keeler PA-C Entered By: Worthy Keeler on 10/12/2021 08:18:53

## 2021-10-13 LAB — HM DIABETES EYE EXAM

## 2021-10-16 NOTE — Progress Notes (Signed)
NORTON, BIVINS ([id number]) Visit Report for 10/12/2021 Arrival Information Details Patient Name: [name], [name] Date of Service: 10/12/2021 8:00 AM Medical Record Number: [id number] Patient Account Number: [id number] Date of Birth/Sex: 05-Aug-1980 (41 y.o. M) Treating RN: [name] Primary Care Naiya Corral: [name] Other Clinician: Massie Kluver Referring Fenton Candee: [name] Treating Arlin Sass/Extender: [name] Cliche in Treatment: 31 Visit Information History Since Last Visit Added or deleted any medications: No Patient Arrived: Ambulatory Any new allergies or adverse reactions: No Arrival Time: 08:01 Had a fall or experienced change in No Transfer Assistance: None activities of daily living that may affect Patient Requires Transmission-Based No risk of falls: Precautions: Signs or symptoms of abuse/neglect since last visito No Patient Has Alerts: Yes Hospitalized since last visit: No Patient Alerts: Patient has reaction Pain Present Now: No to silver dressings. Electronic Signature(s) Signed: 10/16/2021 2:38:02 PM By: Massie Kluver Entered By: Massie Kluver on 10/12/2021 08:09:44 [name] ([id number]) -------------------------------------------------------------------------------- Encounter Discharge Information Details Patient Name: [name] Date of Service: 10/12/2021 8:00 AM Medical Record Number: [id number] Patient Account Number: [id number] Date of Birth/Sex: 1980-12-14 (40 y.o. M) Treating RN: [name] Primary Care Jakira Mcfadden: [name] Other Clinician: Massie Kluver Referring Yasin Ducat: [name] Treating Jacoby Ritsema/Extender: [name] Cliche in Treatment: 253 Encounter Discharge Information Items Post Procedure Vitals Discharge Condition: Stable Temperature (F): 97.8 Ambulatory Status: Ambulatory Pulse (bpm): 72 Discharge Destination: Home Respiratory Rate (breaths/min): 16 Transportation: Private Auto Blood Pressure  (mmHg): 126/69 Accompanied By: self Schedule Follow-up Appointment: Yes Clinical Summary of Care: Electronic Signature(s) Signed: 10/16/2021 2:38:02 PM By: Massie Kluver Entered By: Massie Kluver on 10/12/2021 09:34:57 Gaffey, [name] ([id number]) -------------------------------------------------------------------------------- Lower Extremity Assessment Details Patient Name: [name] Date of Service: 10/12/2021 8:00 AM Medical Record Number: [id number] Patient Account Number: [id number] Date of Birth/Sex: [birth year] (40 y.o. M) Treating RN: [name] Primary Care Chayanne Filippi: [name] Other Clinician: Massie Kluver Referring Sennie Borden: [name] Treating Shalita Notte/Extender: [name] Cliche in Treatment: 253 Edema Assessment Assessed: [Left: No] [Right: No] [Left: Edema] [Right: :] Calf Left: Right: Point of Measurement: 35 cm From Medial Instep 46 cm Ankle Left: Right: Point of Measurement: 10 cm From Medial Instep 28 cm Vascular Assessment Pulses: Dorsalis Pedis Palpable: [Left:Yes] Electronic Signature(s) Signed: 10/12/2021 4:10:29 PM By: [name], [name], RN, [name], [name], [name] Signed: 10/16/2021 2:38:02 PM By: Massie Kluver Entered By: Massie Kluver on 10/12/2021 08:26:31 [name] ([id number]) -------------------------------------------------------------------------------- Multi Wound Chart Details Patient Name: [name] Date of Service: 10/12/2021 8:00 AM Medical Record Number: [id number] Patient Account Number: [id number] Date of Birth/Sex: 01-Jan-1981 (40 y.o. M) Treating RN: [name] Primary Care Shaneisha Burkel: [name] Other Clinician: Massie Kluver Referring Tali Cleaves: [name] Treating Deshondra Worst/Extender: [name] Cliche in Treatment: 253 Vital Signs Height(in): 71 Pulse(bpm): 22 Weight(lbs): 338 Blood Pressure(mmHg): 126/69 Body Mass Index(BMI): 47.1 Temperature(F): 97.8 Respiratory Rate(breaths/min): 18 Photos:  [N/A:N/A] Wound Location: Left, Lateral Lower Leg N/A N/A Wounding Event: Gradually Appeared N/A N/A Primary Etiology: Pyoderma N/A N/A Comorbid History: Sleep Apnea, Hypertension, Colitis N/A N/A Date Acquired: 11/18/2015 N/A N/A Weeks of Treatment: 253 N/A N/A Wound Status: Open N/A N/A Wound Recurrence: No N/A N/A Measurements L x W x D (cm) 3.5x2x0.4 N/A N/A Area (cm) : 5.498 N/A N/A Volume (cm) : 2.199 N/A N/A % Reduction in Area: -12.00% N/A N/A % Reduction in Volume: 44.00% N/A N/A Classification: Full Thickness With Exposed N/A N/A Support Structures Exudate Amount: Medium N/A N/A Exudate Type: Serosanguineous N/A N/A Exudate Color: red, brown N/A N/A Granulation Amount: Large (67-100%)  N/A N/A Granulation Quality: Red N/A N/A Necrotic Amount: Small (1-33%) N/A N/A Exposed Structures: Fat Layer (Subcutaneous Tissue): N/A N/A Yes Epithelialization: Small (1-33%) N/A N/A Procedures Performed: Cellular or Tissue Based Product N/A N/A Treatment Notes Wound #1 (Lower Leg) Wound Laterality: Left, Lateral Cleanser Wound Cleanser Discharge Instruction: Wash your hands with soap and water. Remove old dressing, discard into plastic bag and place into trash. Cleanse the wound with Wound Cleanser prior to applying a clean dressing using gauze sponges, not tissues or cotton balls. Do not scrub or use excessive force. Pat dry using gauze sponges, not tissue or cotton balls. Peri-Wound Care Desitin Maximum Strength Ointment 4 (oz) Discharge Instruction: Apply around wound edges to avoid maceration Johanson, Maxden ([id number]) Triamcinolone Acetonide Cream, 0.1%, 15 (g) tube Discharge Instruction: Apply to leg Topical Clobetasol Propionate ointment 0.05%, 60 (g) tube Discharge Instruction: Pt to bring to appt- applied to wound bed Primary Dressing Secondary Dressing (NON-BORDER) Zetuvit Plus Silicone NON-BORDER 5x5 (in/in) Secured With Compression Wrap Medichoice 4 layer  Compression System, 35-40 mmHG Discharge Instruction: Apply multi-layer wrap as directed. Compression Stockings Environmental education officer) Signed: 10/16/2021 8:53:08 AM By: [name], [name], RN, [name], [name], [name] Entered By: [name], [name], RN, [name], [name] on 10/16/2021 08:53:08 GURJOT, BRISCO ([id number]) -------------------------------------------------------------------------------- Multi-Disciplinary Care Plan Details Patient Name: [name] Date of Service: 10/12/2021 8:00 AM Medical Record Number: [id number] Patient Account Number: [id number] Date of Birth/Sex: [birth year] (40 y.o. M) Treating RN: [name] Primary Care Sanjana Folz: [name] Other Clinician: Massie Kluver Referring Aeris Hersman: [name] Treating Lorretta Kerce/Extender: [name] Cliche in Treatment: [place] reviewed with physician Active Inactive Electronic Signature(s) Signed: 10/16/2021 8:52:49 AM By: [name], [name], RN, [name], [name], [name] Previous Signature: 10/12/2021 4:10:29 PM Version By: [name], [name], RN, [name], [name], [name] Entered By: [name], [name], RN, [name], [name] on 10/16/2021 08:52:48 [name] ([id number]) -------------------------------------------------------------------------------- Pain Assessment Details Patient Name: [name] Date of Service: 10/12/2021 8:00 AM Medical Record Number: [id number] Patient Account Number: [id number] Date of Birth/Sex: [birth year] (40 y.o. M) Treating RN: [name] Primary Care Tierrah Anastos: [name] Other Clinician: Massie Kluver Referring Aloysious Vangieson: [name] Treating Veria Stradley/Extender: [name] Cliche in Treatment: 253 Active Problems Location of Pain Severity and Description of Pain Patient Has Paino No Site Locations Pain Management and Medication Current Pain Management: Electronic Signature(s) Signed: 10/12/2021 4:10:29 PM By: [name], [name], RN, [name], [name], [name] Signed: 10/16/2021 2:38:02 PM By: Massie Kluver Entered By:  Massie Kluver on 10/12/2021 08:13:59 [name] ([id number]) -------------------------------------------------------------------------------- Patient/Caregiver Education Details Patient Name: [name] Date of Service: 10/12/2021 8:00 AM Medical Record Number: [id number] Patient Account Number: [id number] Date of Birth/Gender: [birth year] (40 y.o. M) Treating RN: [name] Primary Care Physician: [name] Other Clinician: Massie Kluver Referring Physician: [name] Treating Physician/Extender: [name] Cliche in Treatment: 253 Education Assessment Education Provided To: Patient Education Topics Provided Wound/Skin Impairment: Handouts: Other: continue wound care as directed Electronic Signature(s) Signed: 10/16/2021 2:38:02 PM By: Massie Kluver Entered By: Massie Kluver on 10/12/2021 09:31:10 [name] ([id number]) -------------------------------------------------------------------------------- Wound Assessment Details Patient Name: [name] Date of Service: 10/12/2021 8:00 AM Medical Record Number: [id number] Patient Account Number: [id number] Date of Birth/Sex: [birth year] (40 y.o. M) Treating RN: [name] Primary Care Ardys Hataway: [name] Other Clinician: Massie Kluver Referring Mathan Darroch: [name] Treating Luticia Tadros/Extender: [name] Cliche in Treatment: 253 Wound Status Wound Number: 1 Primary Etiology: Pyoderma Wound Location: Left, Lateral Lower Leg Wound Status: Open Wounding Event: Gradually Appeared Comorbid History: Sleep  Apnea, Hypertension, Colitis Date Acquired: 11/18/2015 Weeks Of Treatment: 253 Clustered Wound: No Photos Wound Measurements Length: (cm) 3.5 Width: (cm) 2 Depth: (cm) 0.4 Area: (cm) 5.498 Volume: (cm) 2.199 % Reduction in Area: -12% % Reduction in Volume: 44% Epithelialization: Small (1-33%) Tunneling: No Undermining: No Wound Description Classification: Full Thickness With Exposed  Support Structures Exudate Amount: Medium Exudate Type: Serosanguineous Exudate Color: red, brown Foul Odor After Cleansing: No Slough/Fibrino No Wound Bed Granulation Amount: Large (67-100%) Exposed Structure Granulation Quality: Red Fat Layer (Subcutaneous Tissue) Exposed: Yes Necrotic Amount: Small (1-33%) Necrotic Quality: Adherent Slough Treatment Notes Wound #1 (Lower Leg) Wound Laterality: Left, Lateral Cleanser Wound Cleanser Discharge Instruction: Wash your hands with soap and water. Remove old dressing, discard into plastic bag and place into trash. Cleanse the wound with Wound Cleanser prior to applying a clean dressing using gauze sponges, not tissues or cotton balls. Do not scrub or use excessive force. Pat dry using gauze sponges, not tissue or cotton balls. Peri-Wound Care Desitin Maximum Strength Ointment 4 (oz) Myint, Jak ([id number]) Discharge Instruction: Apply around wound edges to avoid maceration Triamcinolone Acetonide Cream, 0.1%, 15 (g) tube Discharge Instruction: Apply to leg Topical Clobetasol Propionate ointment 0.05%, 60 (g) tube Discharge Instruction: Pt to bring to appt- applied to wound bed Primary Dressing Secondary Dressing (NON-BORDER) Zetuvit Plus Silicone NON-BORDER 5x5 (in/in) Secured With Compression Wrap Medichoice 4 layer Compression System, 35-40 mmHG Discharge Instruction: Apply multi-layer wrap as directed. Compression Stockings Environmental education officer) Signed: 10/12/2021 4:10:29 PM By: [name], [name], RN, [name], [name], [name] Signed: 10/16/2021 2:38:02 PM By: Massie Kluver Entered By: Massie Kluver on 10/12/2021 08:25:10 DOY, TAAFFE ([id number]) -------------------------------------------------------------------------------- [place] Details Patient Name: [name] Date of Service: 10/12/2021 8:00 AM Medical Record Number: [id number] Patient Account Number: [id number] Date of Birth/Sex: 01-24-1981 (40 y.o. M) Treating RN:  [name] Primary Care Prynce Jacober: [name] Other Clinician: Massie Kluver Referring Ahsha Hinsley: [name] Treating Diamond Jentz/Extender: [name] Cliche in Treatment: 253 Vital Signs Time Taken: 08:09 Temperature (F): 97.8 Height (in): 71 Pulse (bpm): 72 Weight (lbs): 338 Respiratory Rate (breaths/min): 18 Body Mass Index (BMI): 47.1 Blood Pressure (mmHg): 126/69 Reference Range: 80 - 120 mg / dl Electronic Signature(s) Signed: 10/16/2021 2:38:02 PM By: Massie Kluver Entered By: Massie Kluver on 10/12/2021 08:13:50

## 2021-10-17 DIAGNOSIS — I872 Venous insufficiency (chronic) (peripheral): Secondary | ICD-10-CM | POA: Diagnosis not present

## 2021-10-18 NOTE — Progress Notes (Signed)
[name], [name] ([id number]) Visit Report for 10/17/2021 Arrival Information Details Patient Name: [name], [name] Date of Service: 10/17/2021 8:00 AM Medical Record Number: [id number] Patient Account Number: [id number] Date of Birth/Sex: [birth year] (41 y.o. M) Treating RN: [name] Primary Care Lamir Racca: [name] Other Clinician: Referring Shanetha Bradham: [name] Treating Kajol Crispen/Extender: Yaakov Guthrie in Treatment: 6 Visit Information History Since Last Visit Has Dressing in Place as Prescribed: Yes Patient Arrived: Ambulatory Has Compression in Place as Prescribed: Yes Arrival Time: 08:10 Pain Present Now: No Transfer Assistance: None Patient Identification Verified: Yes Secondary Verification Process Completed: Yes Patient Requires Transmission-Based No Precautions: Patient Has Alerts: Yes Patient Alerts: Patient has reaction to silver dressings. Electronic Signature(s) Signed: 10/18/2021 9:49:48 AM By: [name], [name], RN, [name], [name], [name] Entered By: [name], [name], RN, [name], [name] on 10/17/2021 08:30:03 [name] ([id number]) -------------------------------------------------------------------------------- Compression Therapy Details Patient Name: [name] Date of Service: 10/17/2021 8:00 AM Medical Record Number: [id number] Patient Account Number: [id number] Date of Birth/Sex: Jun 29, 1980 (41 y.o. M) Treating RN: [name] Primary Care Clarrisa Kaylor: [name] Other Clinician: Referring Charmane Protzman: [name] Treating Amra Shukla/Extender: Yaakov Guthrie in Treatment: 254 Compression Therapy Performed for Wound Assessment: Wound #1 Left,Lateral Lower Leg Performed By: Clinician [name] Barman, RN Compression Type: Four Layer Pre Treatment ABI: 1.2 Electronic Signature(s) Signed: 10/18/2021 9:49:48 AM By: [name], [name], RN, [name], [name], [name] Entered By: [name], [name], RN, [name], [name] on 10/17/2021 08:31:21 [name]  ([id number]) -------------------------------------------------------------------------------- Encounter Discharge Information Details Patient Name: [name] Date of Service: 10/17/2021 8:00 AM Medical Record Number: [id number] Patient Account Number: [id number] Date of Birth/Sex: 09/15/1980 (41 y.o. M) Treating RN: [name] Primary Care Phenix Grein: [name] Other Clinician: Referring Lempi Edwin: [name] Treating Avik Leoni/Extender: Yaakov Guthrie in Treatment: 254 Encounter Discharge Information Items Discharge Condition: Stable Ambulatory Status: Ambulatory Discharge Destination: Home Transportation: Private Auto Schedule Follow-up Appointment: Yes Clinical Summary of Care: Electronic Signature(s) Signed: 10/18/2021 9:49:48 AM By: [name], [name], RN, [name], [name], [name] Entered By: [name], [name], RN, [name], [name] on 10/17/2021 08:32:27 [name] ([id number]) -------------------------------------------------------------------------------- Wound Assessment Details Patient Name: [name] Date of Service: 10/17/2021 8:00 AM Medical Record Number: [id number] Patient Account Number: [id number] Date of Birth/Sex: June 05, 1980 (41 y.o. M) Treating RN: [name] Primary Care Ashely Goosby: [name] Other Clinician: Referring Lovena Kluck: [name] Treating Shaunee Mulkern/Extender: Yaakov Guthrie in Treatment: 254 Wound Status Wound Number: 1 Primary Etiology: Pyoderma Wound Location: Left, Lateral Lower Leg Wound Status: Open Wounding Event: Gradually Appeared Comorbid History: Sleep Apnea, Hypertension, Colitis Date Acquired: 11/18/2015 Weeks Of Treatment: 254 Clustered Wound: No Wound Measurements Length: (cm) 3.5 Width: (cm) 2 Depth: (cm) 0.4 Area: (cm) 5.498 Volume: (cm) 2.199 % Reduction in Area: -12% % Reduction in Volume: 44% Epithelialization: Small (1-33%) Wound Description Classification: Full Thickness With Exposed Support Structures Exudate  Amount: Medium Exudate Type: Serosanguineous Exudate Color: red, brown Foul Odor After Cleansing: No Slough/Fibrino No Wound Bed Granulation Amount: Large (67-100%) Exposed Structure Granulation Quality: Red Fat Layer (Subcutaneous Tissue) Exposed: Yes Necrotic Amount: Small (1-33%) Necrotic Quality: Adherent Slough Assessment Notes Apligraf in place Treatment Notes Wound #1 (Lower Leg) Wound Laterality: Left, Lateral Cleanser Wound Cleanser Discharge Instruction: Wash your hands with soap and water. Remove old dressing, discard into plastic bag and place into trash. Cleanse the wound with Wound Cleanser prior to applying a clean dressing using gauze sponges, not tissues or cotton balls. Do not scrub or use excessive force. Pat dry using gauze sponges, not tissue or cotton balls. Peri-Wound  Care Topical Primary Dressing Secondary Dressing (NON-BORDER) Zetuvit Plus Silicone NON-BORDER 5x5 (in/in) Secured With Compression Wrap Medichoice 4 layer Compression System, 35-40 mmHG Discharge Instruction: Apply multi-layer wrap as directed. Compression Stockings SHAARAV, RIPPLE ([id number]) Add-Ons Electronic Signature(s) Signed: 10/18/2021 9:49:48 AM By: [name], [name], RN, [name], [name], [name] Entered By: [name], [name], RN, [name], [name] on 10/17/2021 08:30:43

## 2021-10-19 ENCOUNTER — Encounter: Payer: 59 | Attending: Physician Assistant | Admitting: Physician Assistant

## 2021-10-19 DIAGNOSIS — L97222 Non-pressure chronic ulcer of left calf with fat layer exposed: Secondary | ICD-10-CM | POA: Diagnosis not present

## 2021-10-19 DIAGNOSIS — I872 Venous insufficiency (chronic) (peripheral): Secondary | ICD-10-CM | POA: Diagnosis not present

## 2021-10-19 DIAGNOSIS — L88 Pyoderma gangrenosum: Secondary | ICD-10-CM | POA: Diagnosis not present

## 2021-10-19 DIAGNOSIS — F17208 Nicotine dependence, unspecified, with other nicotine-induced disorders: Secondary | ICD-10-CM | POA: Insufficient documentation

## 2021-10-19 DIAGNOSIS — E11622 Type 2 diabetes mellitus with other skin ulcer: Secondary | ICD-10-CM | POA: Insufficient documentation

## 2021-10-19 NOTE — Progress Notes (Signed)
[name] ([id number]) Visit Report for 10/19/2021 Arrival Information Details Patient Name: [name], [name] Date of Service: 10/19/2021 8:15 AM Medical Record Number: [id number] Patient Account Number: [id number] Date of Birth/Sex: 02-26-1981 (41 y.o. M) Treating RN: [name] Primary Care [name]: [name] Other Clinician: Massie [name] Referring Brace Welte: [name] Treating [name]/Extender: [name] in Treatment: 254 Visit Information History Since Last Visit Added or deleted any medications: No Patient Arrived: Ambulatory Any new allergies or adverse reactions: No Arrival Time: 08:02 Had a fall or experienced change in No Transfer Assistance: None activities of daily living that may affect Patient Requires Transmission-Based No risk of falls: Precautions: Hospitalized since last visit: No Patient Has Alerts: Yes Pain Present Now: No Patient Alerts: Patient has reaction to silver dressings. Electronic Signature(s) Signed: 10/19/2021 12:57:21 PM By: [name] Entered By: [name] on 10/19/2021 08:16:19 [name] ([id number]) -------------------------------------------------------------------------------- Clinic Level of Care Assessment Details Patient Name: [name] Date of Service: 10/19/2021 8:15 AM Medical Record Number: [id number] Patient Account Number: [id number] Date of Birth/Sex: 31-May-1980 (41 y.o. M) Treating RN: [name] Primary Care Jinelle Butchko: [name] Other Clinician: Massie [name] Referring [name]: [name] Treating [name]/Extender: [name] in Treatment: 254 Clinic Level of Care Assessment Items TOOL 1 Quantity Score []  - Use when EandM and Procedure is performed on INITIAL visit 0 ASSESSMENTS - Nursing Assessment / Reassessment []  - General Physical Exam (combine w/ comprehensive assessment (listed just below) when performed on new 0 pt. evals) []  - 0 Comprehensive Assessment (HX,  ROS, Risk Assessments, Wounds Hx, etc.) ASSESSMENTS - Wound and Skin Assessment / Reassessment []  - Dermatologic / Skin Assessment (not related to wound area) 0 ASSESSMENTS - Ostomy and/or Continence Assessment and Care []  - Incontinence Assessment and Management 0 []  - 0 Ostomy Care Assessment and Management (repouching, etc.) PROCESS - Coordination of Care []  - Simple Patient / Family Education for ongoing care 0 []  - 0 Complex (extensive) Patient / Family Education for ongoing care []  - 0 Staff obtains Programmer, systems, Records, Test Results / Process Orders []  - 0 Staff telephones HHA, Nursing Homes / Clarify orders / etc []  - 0 Routine Transfer to another Facility (non-emergent condition) []  - 0 Routine Hospital Admission (non-emergent condition) []  - 0 New Admissions / Biomedical engineer / Ordering NPWT, Apligraf, etc. []  - 0 Emergency Hospital Admission (emergent condition) PROCESS - Special Needs []  - Pediatric / Minor Patient Management 0 []  - 0 Isolation Patient Management []  - 0 Hearing / Language / Visual special needs []  - 0 Assessment of Community assistance (transportation, D/C planning, etc.) []  - 0 Additional assistance / Altered mentation []  - 0 Support Surface(s) Assessment (bed, cushion, seat, etc.) INTERVENTIONS - Miscellaneous []  - External ear exam 0 []  - 0 Patient Transfer (multiple staff / Civil Service fast streamer / Similar devices) []  - 0 Simple Staple / Suture removal (25 or less) []  - 0 Complex Staple / Suture removal (26 or more) []  - 0 Hypo/Hyperglycemic Management (do not check if billed separately) []  - 0 Ankle / Brachial Index (ABI) - do not check if billed separately Has the patient been seen at the hospital within the last three years: Yes Total Score: 0 Level Of Care: ____ [name] ([id number]) Electronic Signature(s) Signed: 10/19/2021 12:57:21 PM By: [name] Entered By: [name] on 10/19/2021 09:53:20 [name], [name] [place]  ([id number]) -------------------------------------------------------------------------------- Encounter Discharge Information Details Patient Name: [name] Date of Service: 10/19/2021 8:15 AM Medical Record Number: [id number] Patient Account Number: [id number]  Date of Birth/Sex: Nov 18, 1980 (41 y.o. M) Treating RN: [name] Primary Care Izacc Demeyer: [name] Other Clinician: Massie [name] Referring Lashandra Arauz: [name] Treating Tamaj Jurgens/Extender: [name] in Treatment: 254 Encounter Discharge Information Items Post Procedure Vitals Discharge Condition: Stable Temperature (F): 98.2 Ambulatory Status: Ambulatory Pulse (bpm): 76 Discharge Destination: Home Respiratory Rate (breaths/min): 18 Transportation: Private Auto Blood Pressure (mmHg): 117/76 Accompanied By: [name] Schedule Follow-up Appointment: Yes Clinical Summary of Care: Electronic Signature(s) Signed: 10/19/2021 12:57:21 PM By: [name] Entered By: [name] on 10/19/2021 [name], [name] [place] ([id number]) -------------------------------------------------------------------------------- Lower Extremity Assessment Details Patient Name: [name] Date of Service: 10/19/2021 8:15 AM Medical Record Number: [id number] Patient Account Number: [id number] Date of Birth/Sex: Sep 26, 1980 (40 y.o. M) Treating RN: [name] Primary Care Ranessa Kosta: [name] Other Clinician: Massie [name] Referring Kiandra Sanguinetti: [name] Treating Dominque Marlin/Extender: [name] Weeks in Treatment: 254 Edema Assessment Assessed: [Left: Yes] [name]: No] Edema: [Left: Ye] [Right: s] Calf Left: Right: Point of Measurement: 35 cm From Medial Instep 45.7 cm Ankle Left: Right: Point of Measurement: 10 cm From Medial Instep 28.7 cm Vascular Assessment Pulses: Dorsalis Pedis Palpable: [Left:Yes] Electronic Signature(s) Signed: 10/19/2021 12:57:21 PM By: [name] Signed: 10/19/2021 3:55:46 PM By: [name],  [name], RN, [name], [name], [name] Entered By: [name] on 10/19/2021 08:32:39 [name] ([id number]) -------------------------------------------------------------------------------- Multi Wound Chart Details Patient Name: [name] Date of Service: 10/19/2021 8:15 AM Medical Record Number: [id number] Patient Account Number: [id number] Date of Birth/Sex: 1981-05-05 (40 y.o. M) Treating RN: [name] Primary Care Chrisann Melaragno: [name] Other Clinician: Massie [name] Referring Richerd Grime: [name] Treating Kaedon Fanelli/Extender: [name] in Treatment: 254 Vital Signs Height(in): 71 Pulse(bpm): 76 Weight(lbs): 338 Blood Pressure(mmHg): 117/76 Body Mass Index(BMI): 47.1 Temperature(F): 98.2 Respiratory Rate(breaths/min): 18 Photos: [N/A:N/A] Wound Location: Left, Lateral Lower Leg N/A N/A Wounding Event: Gradually Appeared N/A N/A Primary Etiology: Pyoderma N/A N/A Comorbid History: Sleep Apnea, Hypertension, Colitis N/A N/A Date Acquired: 11/18/2015 N/A N/A Weeks of Treatment: 254 N/A N/A Wound Status: Open N/A N/A Wound Recurrence: No N/A N/A Measurements L x W x D (cm) 3.4x2.3x0.4 N/A N/A Area (cm) : 6.142 N/A N/A Volume (cm) : 2.457 N/A N/A % Reduction in Area: -25.10% N/A N/A % Reduction in Volume: 37.40% N/A N/A Classification: Full Thickness With Exposed N/A N/A Support Structures Exudate Amount: Medium N/A N/A Exudate Type: Serosanguineous N/A N/A Exudate Color: red, brown N/A N/A Granulation Amount: Large (67-100%) N/A N/A Granulation Quality: Red N/A N/A Necrotic Amount: Small (1-33%) N/A N/A Exposed Structures: Fat Layer (Subcutaneous Tissue): N/A N/A Yes Epithelialization: Small (1-33%) N/A N/A Treatment Notes Electronic Signature(s) Signed: 10/19/2021 12:57:21 PM By: [name] Entered By: [name] on 10/19/2021 08:35:33 Pedigo, [name] [place]  ([id number]) -------------------------------------------------------------------------------- Multi-Disciplinary Care Plan Details Patient Name: [name] Date of Service: 10/19/2021 8:15 AM Medical Record Number: [id number] Patient Account Number: [id number] Date of Birth/Sex: [birth year] (40 y.o. M) Treating RN: [name] Primary Care Ameris Akamine: [name] Other Clinician: Massie [name] Referring Medora Roorda: [name] Treating Greco Gastelum/Extender: [name] in Treatment: 254 oo Multidisciplinary Care Plan reviewed with physician Active Inactive Electronic Signature(s) Signed: 10/19/2021 12:57:21 PM By: [name] Signed: 10/19/2021 3:55:46 PM By: [name] [name], RN, [name], [name], [name] Entered By: [name] on 10/19/2021 08:33:38 BALRAJ, BRAYFIELD ([id number]) -------------------------------------------------------------------------------- Pain Assessment Details Patient Name: [name] Date of Service: 10/19/2021 8:15 AM Medical Record Number: [id number] Patient Account Number: [id number] Date of Birth/Sex: Sep 04, 1980 (40 y.o. M) Treating RN: [name] Primary Care Collette Pescador: [name] Other  Clinician: Massie [name] Referring Mella Inclan: [name] Treating Sydny Schnitzler/Extender: [name] in Treatment: 254 Active Problems Location of Pain Severity and Description of Pain Patient Has Paino No Site Locations Pain Management and Medication Current Pain Management: Electronic Signature(s) Signed: 10/19/2021 12:57:21 PM By: [name] Signed: 10/19/2021 3:55:46 PM By: [name], [name], RN, [name], [name], [name] Entered By: [name] on 10/19/2021 08:19:05 KAMARII, CARTON ([id number]) -------------------------------------------------------------------------------- Patient/Caregiver Education Details Patient Name: [name] Date of Service: 10/19/2021 8:15 AM Medical Record Number: [id number] Patient Account Number: [id number] Date of Birth/Gender:  [birth year] (41 y.o. M) Treating RN: [name] Primary Care Physician: [name] Other Clinician: Massie [name] Referring Physician: Alma [name] Treating Physician/Extender: [name] in Treatment: 254 Education Assessment Education Provided To: Patient Education Topics Provided Wound/Skin Impairment: Handouts: Other: continue wound care as directed Electronic Signature(s) Signed: 10/19/2021 12:57:21 PM By: [name] Entered By: [name] on 10/19/2021 09:11:51 Junkins, [name] [place] ([id number]) -------------------------------------------------------------------------------- Wound Assessment Details Patient Name: [name] Date of Service: 10/19/2021 8:15 AM Medical Record Number: [id number] Patient Account Number: [id number] Date of Birth/Sex: Jul 07, 1980 (40 y.o. M) Treating RN: [name] Primary Care Jaelin Devincentis: [name] Other Clinician: Massie [name] Referring Saleemah Mollenhauer: [name] Treating Dariela Stoker/Extender: [name] in Treatment: 254 Wound Status Wound Number: 1 Primary Etiology: Pyoderma Wound Location: Left, Lateral Lower Leg Wound Status: Open Wounding Event: Gradually Appeared Comorbid History: Sleep Apnea, Hypertension, Colitis Date Acquired: 11/18/2015 Weeks Of Treatment: 254 Clustered Wound: No Photos Wound Measurements Length: (cm) 3.4 Width: (cm) 2.3 Depth: (cm) 0.4 Area: (cm) 6.142 Volume: (cm) 2.457 % Reduction in Area: -25.1% % Reduction in Volume: 37.4% Epithelialization: Small (1-33%) Tunneling: No Undermining: No Wound Description Classification: Full Thickness With Exposed Support Structures Exudate Amount: Medium Exudate Type: Serosanguineous Exudate Color: red, brown Foul Odor After Cleansing: No Slough/Fibrino Yes Wound Bed Granulation Amount: Large (67-100%) Exposed Structure Granulation Quality: Red Fat Layer (Subcutaneous Tissue) Exposed: Yes Necrotic Amount: Small (1-33%) Necrotic  Quality: Adherent Slough Treatment Notes Wound #1 (Lower Leg) Wound Laterality: Left, Lateral Cleanser Wound Cleanser Discharge Instruction: Wash your hands with soap and water. Remove old dressing, discard into plastic bag and place into trash. Cleanse the wound with Wound Cleanser prior to applying a clean dressing using gauze sponges, not tissues or cotton balls. Do not scrub or use excessive force. Pat dry using gauze sponges, not tissue or cotton balls. Peri-Wound Care Desitin Maximum Strength Ointment 4 (oz) Calvin, Nolyn ([id number]) Discharge Instruction: Apply around wound edges to avoid maceration Triamcinolone Acetonide Cream, 0.1%, 15 (g) tube Discharge Instruction: Apply to leg Topical Clobetasol Propionate ointment 0.05%, 60 (g) tube Discharge Instruction: Pt to bring to appt- applied to wound bed Primary Dressing Secondary Dressing (NON-BORDER) Zetuvit Plus Silicone NON-BORDER 5x5 (in/in) Secured With Compression Wrap Medichoice 4 layer Compression System, 35-40 mmHG Discharge Instruction: Apply multi-layer wrap as directed. Compression Stockings Add-Ons Electronic Signature(s) Signed: 10/19/2021 12:57:21 PM By: [name] Signed: 10/19/2021 3:55:46 PM By: [name], [name], RN, [name], [name], [name] Entered By: [name] on 10/19/2021 08:31:05 HOLLEY, WIRT ([id number]) -------------------------------------------------------------------------------- [place] Details Patient Name: [name] Date of Service: 10/19/2021 8:15 AM Medical Record Number: [id number] Patient Account Number: [id number] Date of Birth/Sex: 07-04-1980 (40 y.o. M) Treating RN: [name] Primary Care Adelyna Brockman: [name] Other Clinician: Massie [name] Referring Tajha Sammarco: [name] Treating Magdelyn Roebuck/Extender: [name] in Treatment: 254 Vital Signs Time Taken: 08:16 Temperature (F): 98.2 Height (in): 71 Pulse (bpm): 76 Weight (lbs): 338 Respiratory Rate (breaths/min):  18 Body Mass  Index (BMI): 47.1 Blood Pressure (mmHg): 117/76 Reference Range: 80 - 120 mg / dl Electronic Signature(s) Signed: 10/19/2021 12:57:21 PM By: [name] Entered By: [name] on 10/19/2021 08:18:58

## 2021-10-19 NOTE — Progress Notes (Addendum)
[name], [name] ([id number]) Visit Report for 10/19/2021 Chief Complaint Document Details Patient Name: [name], [name] Date of Service: 10/19/2021 8:15 AM Medical Record Number: [id number] Patient Account Number: [id number] Date of Birth/Sex: 10-21-1980 (41 y.o. 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 M) Treating RN: [name] Primary Care Provider: [name] Other Clinician: Massie Kluver Referring Provider: [name] Treating Provider/Extender: [name] Cliche in Treatment: 254 Cellular or Tissue Based Product Type Wound #1 Left,Lateral Lower Leg Applied to: Performed By: Physician Tommie Sams., PA-C Cellular or Tissue Based Product Apligraf Type: Level of Consciousness (Pre- Awake and Alert procedure): Pre-procedure Verification/Time Out Yes - 08:40 Taken: Location: trunk / arms / legs Wound Size (sq cm): 7.82 Product Size (sq cm): 44 Waste Size (sq cm): 22 Amount of Product Applied (sq cm): 22 Instrument Used: Forceps Lot #: [id number] Expiration Date: 10/23/2021 Fenestrated: Yes Instrument: Blade Reconstituted: Yes Solution Type: normal saline Solution Amount: 5 ml Lot #: [id number] Solution Expiration Date: 06/22/2023 Secured: Yes Secured With:  Steri-Strips Dressing Applied: Yes Primary Dressing: Zetuvit Procedural Pain: 0 Post Procedural Pain: 0 Response to Treatment: Procedure was tolerated well Level of Consciousness (Post- Awake and Alert procedure): Post Procedure Diagnosis Same as Pre-procedure Electronic Signature(s) Signed: 10/19/2021 12:57:21 PM By: Massie Kluver Entered By: Massie Kluver on 10/19/2021 08:53:04 [name], [name] ([id number]) -------------------------------------------------------------------------------- HPI Details Patient Name: [name] Date of Service: 10/19/2021 8:15 AM Medical Record Number: [id number] Patient Account Number: [id number] Date of Birth/Sex: 01-Nov-1980 (41 y.o. M) Treating RN: [name] Primary Care Provider: [name] Other Clinician: Massie Kluver Referring Provider: [name] Treating Provider/Extender: [name] Cliche in Treatment: 254 History of Present Illness HPI Description: 12/04/16; 41 year old man who comes into the clinic today for review of a wound on the posterior left calf. He tells me that is been there for about a year. He is not a diabetic he does smoke half a pack per day. He was seen in the ER on 11/20/16 felt to have cellulitis around the wound and was given clindamycin. An x-ray did not show osteomyelitis. The patient initially tells me that he has a milk allergy that sets off a pruritic itching rash on his lower legs which she scratches incessantly and he thinks that's what may have set up the wound. He has been using various topical antibiotics and ointments without any effect. He works in a [employer] and is on his feet all day. He does not have a prior history of wounds however he does have the rash on both lower legs the right arm and the ventral aspect of his left arm. These are excoriations and clearly have had scratching however there are of macular looking areas on both legs including a substantial larger area on the right leg.  This does not have an underlying open area. There is no blistering. The patient tells me that 2 years ago in [place] in response to the rash on his legs he saw a dermatologist who told him  he had a condition which may be pyoderma gangrenosum although I may be putting words into his mouth. He seemed to recognize this. On further questioning he admits to a 5 year history of quiesced. ulcerative colitis. He is not in any treatment for this. He's had no recent travel 12/11/16; the patient arrives today with his wound and roughly the same condition we've been using silver alginate this is a deep punched out wound with some surrounding erythema but no tenderness. Biopsy I did did not show confirmed pyoderma gangrenosum suggested nonspecific inflammation and vasculitis but does not provide an actual description of what was seen by the pathologist. I'm really not able to understand this We have also received information from the patient's dermatologist in [place] notes from April 2016. This was a doctor Agarwal-antal. 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 He was prescribed topical steroid high potency under occlusion which helped but at this point the patient did not have a deep punched out wound. 12/18/16; the patient's wound is larger in terms of surface area however this surface looks better and there is less depth. The surrounding erythema also is better. The patient states that the wrap we put on came off 2 days ago when he has been using his compression stockings. He we are in the process of getting a dermatology consult. 12/26/16 on evaluation today patient's left lower extremity wound shows evidence of infection with surrounding erythema noted. He has been tolerating the dressing changes but states that he has noted more discomfort. There is a larger area of erythema surrounding the wound. No fevers, chills, nausea, or vomiting noted at this time. With that being said the wound still does  have slough covering the surface. He is not allergic to any medication that he is aware of at this point. In regard to his right lower extremity he had several regions that are erythematous and pruritic he wonders if there's anything we can do to help that. 01/02/17 I reviewed patient's wound culture which was obtained his visit last week. He was placed on doxycycline at that point. Unfortunately that does not appear to be an antibiotic that would likely help with the situation however the pseudomonas noted on culture is sensitive to Cipro. Also unfortunately patient's wound seems to have a large compared to last week's evaluation. Not severely so but there are definitely increased measurements in general. He is continuing to have discomfort as well he writes this to be a seven out of 10. In fact he would prefer me not to perform any debridement today due to the fact that he is having discomfort and considering he has an active infection on the little reluctant to do so anyway. No fevers, chills, nausea, or vomiting noted at this time. 01/08/17; patient seems dermatology on September 5. I suspect dermatology will want the slides from the biopsy I did sent to their pathologist. I'm not sure if there is a way we can expedite that. In any case the culture I did before I left on vacation 3 weeks ago showed Pseudomonas he was given 10 days of Cipro and per her description of her intake nurses is actually somewhat better this week although the wound is quite a bit bigger than I remember the last time I saw this. He still has 3 more days of Cipro 01/21/17; dermatology appointment tomorrow. He has completed the ciprofloxacin for Pseudomonas. Surface of the wound looks better however he is had some deterioration in the lesions on  his right leg. Meantime the left lateral leg wound we will continue with sample 01/29/17; patient had his dermatology appointment but I can't yet see that note. He is completed his  antibiotics. The wound is more superficial but considerably larger in circumferential area than when he came in. This is in his left lateral calf. He also has swollen erythematous areas with superficial wounds on the right leg and small papular areas on both arms. There apparently areas in her his upper thighs and buttocks I did not look at those. Dermatology biopsied the right leg. Hopefully will have their input next week. 02/05/17; patient went back to see his dermatologist who told him that he had a "scratching problem" as well as staph. He is now on a 30 day course of doxycycline and I believe she gave him triamcinolone cream to the right leg areas to help with the itching [not exactly sure but probably triamcinolone]. She apparently looked at the left lateral leg wound although this was not rebiopsied and I think felt to be ultimately part of the same pathogenesis. He is using sample border foam and changing nevus himself. He now has a new open area on the right posterior leg which was his biopsy site I don't have any of the dermatology notes 02/12/17; we put the patient in compression last week with SANTYL to the wound on the left leg and the biopsy. Edema is much better and the depth of the wound is now at level of skin. Area is still the same oBiopsy site on the right lateral leg we've also been using santyl with a border foam dressing and he is changing this himself. 02/19/17; Using silver alginate started last week to both the substantial left leg wound and the biopsy site on the right wound. He is tolerating compression well. Has a an appointment with his primary M.D. tomorrow wondering about diuretics although I'm wondering if the edema problem is actually lymphedema 02/26/17; the patient has been to see his primary doctor Dr. Jerrel Ivory at [facility]. She started him on Lasix 20 mg and this seems to have helped with the edema. However we are not making substantial change  with the left lateral calf wound and inflammation. The biopsy site on the right leg also looks stable but not really all that different. 03/12/17; the patient has been to see vein and vascular Dr. Lucky Cowboy. He has had venous reflux studies I have not reviewed these. I did get a call from his dermatology office. They felt that he might have pathergy based on their biopsy on his right leg which led them to look at the slides of [name], [name] ([id number]) the biopsy I did on the left leg and they wonder whether this represents pyoderma gangrenosum which was the original supposition in a man with ulcerative colitis albeit inactive for many years. They therefore recommended clobetasol and tetracycline i.e. aggressive treatment for possible pyoderma gangrenosum. 03/26/17; apparently the patient just had reflux studies not an appointment with Dr. dew. She arrives in clinic today having applied clobetasol for 2-3 weeks. He notes over the last 2-3 days excessive drainage having to change the dressing 3-4 times a day and also expanding erythema. He states the expanding erythema seems to come and go and was last this red was earlier in the month.he is on doxycycline 150 mg twice a day as an anti-inflammatory systemic therapy for possible pyoderma gangrenosum along with the topical clobetasol 04/02/17; the patient was seen last week  by Dr. Lillia Carmel at [facility] dermatology locally who kindly saw him at my request. 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 My concern was the degree of erythema medially extending from his left leg wound which was either inflammation from pyoderma or cellulitis. I put him on Augmentin however culture of the wound showed Pseudomonas which is quinolone sensitive. I really don't believe he has cellulitis however in view of everything I will continue and give him a course of Cipro. He is also on doxycycline as an immune modulator  for the pyoderma. In addition to his original wound on the left lateral leg with surrounding erythema he has a wound on the right posterior calf which was an original biopsy site done by dermatology. This was felt to represent pathergy from pyoderma gangrenosum 04/16/17; pyoderma gangrenosum. Saw Dr. Lillia Carmel yesterday. He has been using topical antibiotics to both wound areas his original wound on the left and the biopsies/pathergy area on the right. There is definitely some improvement in the inflammation around the wound on the right although the patient states he has increasing sensitivity of the wounds. He is on prednisone 60 and doxycycline 1 as prescribed by Dr. Lillia Carmel. He is covering the topical antibiotic with gauze and putting this in his own compression stocks and changing this daily. He states that Dr. Lottie Rater did a culture of the left leg wound yesterday 05/07/17; pyoderma gangrenosum. The patient saw Dr. Lillia Carmel yesterday and has a follow-up with her in one month. He is still using topical antibiotics to both wounds although he can't recall exactly what type. He is still on prednisone 60 mg. Dr. Lillia Carmel stated that the doxycycline could stop if we were in agreement. 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 The right medial leg was induced by biopsy/pathergy. The area on the right is essentially healed. Still on high-dose prednisone using topical antibiotics to the wound 07/09/17; pyoderma gangrenosum with wounds on the left lateral leg. The right medial leg has closed and remains closed. He is still on prednisone 60. oHe tells me he missed his last dermatology appointment with Dr. Lillia Carmel but will make another appointment. He reports that her blood sugar at a recent screen in [place] was high 200's. He was 180 today. 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  I think he is going to require still much longer prednisone perhaps another 3 months before attempting to taper. In the meantime his wound is a lot better. Smaller. He is cleaning this off daily and applying topical antibiotics. When he was last in the clinic I thought about changing to [facility] and actually put in a couple of calls to dermatology although probably not during their business hours. 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 He continues on oral prednisone. He has been using triple antibiotic ointment. There is surface debris and we will transition to [facility] and have him return in 2 weeks. He has lost 30 pounds since his last appointment with lifestyle modification. He may benefit from topical steroid cream for treatment this can be considered at a later date. 08/22/17 on evaluation today patient appears to actually be doing rather well in regard to his left lateral  lower extremity ulcer. He has actually been managed by Dr. Dellia Nims most recently. Patient is currently on oral steroids at this time. This seems to have been of benefit for him. Nonetheless his last visit was actually with Leah on 08/06/17. Currently he is not utilizing any topical steroid creams although this could be of benefit as well. No fevers, chills, nausea, or vomiting noted at this time. 09/05/17 on evaluation today patient appears to be doing better in regard to his left lateral lower extremity ulcer. He has been tolerating the dressing changes without complication. He is using Santyl with good effect. Overall I'm very pleased with how things are standing at this point. Patient likewise is happy that this is doing better. 09/19/17 on evaluation today patient actually appears to be doing rather well in regard to his left lateral lower extremity ulcer. Again this is secondary to Pyoderma  gangrenosum and he seems to be progressing well with the Santyl which is good news. He's not having any significant pain. 10/03/17 on evaluation today patient appears to be doing excellent in regard to his lower extremity wound on the left secondary to Pyoderma gangrenosum. He has been tolerating the Santyl without complication and in general I feel like he's making good progress. 10/17/17 on evaluation today patient appears to be doing very well in regard to his left lateral lower surety ulcer. He has been tolerating the dressing changes without complication. There does not appear to be any evidence of infection he's alternating the Santyl and the triple antibiotic ointment every other day this seems to be doing well for him. 11/03/17 on evaluation today patient appears to be doing very well in regard to his left lateral lower extremity ulcer. He is been tolerating the dressing changes without complication which is good news. Fortunately there does not appear to be any evidence of infection which is also great news. Overall is doing excellent they are starting to taper down on the prednisone is down to 40 mg at this point it also started topical clobetasol for him. 11/17/17 on evaluation today patient appears to be doing well in regard to his left lateral lower surety ulcer. He's been tolerating the dressing changes without complication. He does note that he is having no pain, no excessive drainage or discharge, and overall he feels like things are going about how he would expect and hope they would. Overall he seems to have no evidence of infection at this time in my opinion which is good news. 12/04/17-He is seen in follow-up evaluation for right lateral lower extremity ulcer. He has been applying topical steroid cream. Today's measurement show slight increase in size. Over the next 2 weeks we will transition to every other day Santyl and steroid cream. He has been encouraged to monitor for changes and  notify clinic with any concerns 12/15/17 on evaluation today patient's left lateral motion the ulcer and fortunately is doing worse again at this point. This just since last week to this week has close to doubled in size according to the patient. I did not seeing last week's I do not have a visual to compare this to in our system was also down so we do not have all the charts and at this point. Nonetheless it does have me somewhat concerned in regard to the fact that again he was worried enough about it he has contact the dermatology that placed them back on the full strength, 50 mg a day of the prednisone that he was taken  previous. He continues to alternate using clobetasol along with Santyl at this point. He is obviously somewhat frustrated. 12/22/17 on evaluation today patient appears to be doing a little worse compared to last evaluation. Unfortunately the wound is a little deeper and slightly larger than the last week's evaluation. With that being said he has made some progress in regard to the irritation surrounding at this time unfortunately despite that progress that's been made he still has a significant issue going on here. 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 [name], [name] ([id number]) For that reason I discussed with him today the possibility of perform a wound culture to ensure there's nothing overtly infected. 01/06/18 on evaluation today patient's wound is larger and deeper than previously evaluated. With that being said it did appear that his wound was infected after my last evaluation with him. Subsequently I did end up prescribing a prescription for Bactrim DS which she has been taking and having no complication with. Fortunately there does not appear to be any evidence of infection at this point in time as far as anything  spreading, no want to touch, and overall I feel like things are showing signs of improvement. 01/13/18 on evaluation today patient appears to be even a little larger and deeper than last time. There still muscle exposed in the base of the wound. Nonetheless he does appear to be less erythematous I do believe inflammation is calming down also believe the infection looks like it's probably resolved at this time based on what I'm seeing. No fevers, chills, nausea, or vomiting noted at this time. 01/30/18 on evaluation today patient actually appears to visually look better for the most part. Unfortunately those visually this looks better he does seem to potentially have what may be an abscess in the muscle that has been noted in the central portion of the wound. This is the first time that I have noted what appears to be fluctuance in the central portion of the muscle. With that being said I'm somewhat more concerned about the fact that this might indicate an abscess formation at this location. I do believe that an ultrasound would be appropriate. This is likely something we need to try to do as soon as possible. He has been switch to mupirocin ointment and he is no longer using the steroid ointment as prescribed by dermatology he sees them again next week he's been decreased from 60 to 40 mg of prednisone. 03/09/18 on evaluation today patient actually appears to be doing a little better compared to last time I saw him. There's not as much erythema surrounding the wound itself. He I did review his most recent infectious disease note which was dated 02/24/18. He saw Dr. Michel Bickers in [place]. 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 Patient is now off of antibiotics and they are continually observing this. There seems to be no change in the past two weeks in my pinion based on what the patient says and what I see  today compared to what Dr. Megan Salon likely saw two weeks ago. No fevers, chills, nausea, or vomiting noted at this time. 03/23/18 on evaluation today patient's wound actually appears to be showing signs of improvement which is good news. He is currently still on the Dapsone.  He is also working on tapering the prednisone to get off of this and Dr. Lottie Rater is working with him in this regard. Nonetheless overall I feel like the wound is doing well it does appear based on the infectious disease note that I reviewed from Dr. 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 I did also review the results of his ultrasound of left lower extremity which revealed there was a dentist tissue in the base of the wound without an abscess noted. 04/06/18 on evaluation today the patient's left lateral lower extremity ulcer actually appears to be doing fairly well which is excellent news. There does not appear to be any evidence of infection at this time which is also great news. Overall he still does have a significantly large ulceration although little by little he seems to be making progress. He is down to 10 mg a day of the prednisone. 04/20/18 on evaluation today patient actually appears to be doing excellent at this time in regard to his left lower extremity ulcer. He's making signs of good progress unfortunately this is taking much longer than we would really like to see but nonetheless he is making progress. Fortunately there does not appear to be any evidence of infection at this time. No fevers, chills, nausea, or vomiting noted at this time. The patient has not been using the Santyl due to the cost he hadn't got in this field yet. He's mainly been using the antibiotic ointment topically. Subsequently he also tells me that he really has not been scrubbing in the shower I think this would be helpful  again as I told him it doesn't have to be anything too aggressive to even make it believe just enough to keep it free of some of the loose slough and biofilm on the wound surface. 05/11/18 on evaluation today patient's wound appears to be making slow but sure progress in regard to the left lateral lower extremity ulcer. He is been tolerating the dressing changes without complication. Fortunately there does not appear to be any evidence of infection at this time. He is still just using triple antibiotic ointment along with clobetasol occasionally over the area. He never got the Santyl and really does not seem to intend to in my pinion. 06/01/18 on evaluation today patient appears to be doing a little better in regard to his left lateral lower extremity ulcer. He states that overall he does not feel like he is doing as well with the Dapsone as he did with the prednisone. Nonetheless he sees his dermatologist later today and is gonna talk to them about the possibility of going back on the prednisone. Overall again I believe that the wound would be better if you would utilize Santyl but he really does not seem to be interested in going back to the [place] at this point. He has been using triple antibiotic ointment. 06/15/18 on evaluation today patient's wound actually appears to be doing about the same at this point. Fortunately there is no signs of infection at this time. He has made slight improvements although he continues to not really want to clean the wound bed at this point. He states that he just doesn't mess with it he doesn't want to cause any problems with everything else he has going on. He has been on medication, antibiotics as prescribed by his dermatologist, for a staff infection of  his lower extremities which is really drying out now and looking much better he tells me. Fortunately there is no sign of overall infection. 06/29/18 on evaluation today patient appears to be doing well in regard to  his left lateral lower surety ulcer all things considering. Fortunately his staff infection seems to be greatly improved compared to previous. He has no signs of infection and this is drying up quite nicely. He is still the doxycycline for this is no longer on cental, Dapsone, or any of the other medications. His dermatologist has recommended possibility of an infusion but right now he does not want to proceed with that. 07/13/18 on evaluation today patient appears to be doing about the same in regard to his left lateral lower surety ulcer. Fortunately there's no signs of infection at this time which is great news. Unfortunately he still builds up a significant amount of Slough/biofilm of the surface of the wound he still is not really cleaning this as he should be appropriately. Again I'm able to easily with saline and gauze remove the majority of this on the surface which if you would do this at home would likely be a dramatic improvement for him as far as getting the area to improve. Nonetheless overall I still feel like he is making progress is just very slow. I think Santyl will be of benefit for him as well. Still he has not gotten this as of this point. 07/27/18 on evaluation today patient actually appears to be doing little worse in regards of the erythema around the periwound region of the wound he also tells me that he's been having more drainage currently compared to what he was experiencing last time I saw him. He states not quite as bad as what he had because this was infected previously but nonetheless is still appears to be doing poorly. Fortunately there is no evidence of systemic infection at this point. The patient tells me that he is not going to be able to afford the Santyl. He is still waiting to hear about the infusion therapy with his dermatologist. Apparently she wants an updated colonoscopy first. 08/10/18 on evaluation today patient appears to be doing better in regard to his  left lateral lower extremity ulcer. Fortunately he is showing signs of improvement in this regard he's actually been approved for Remicade infusion's as well although this has not been scheduled as of yet. Fortunately there's no signs of active infection at this time in regard to the wound although he is having some issues with infection of the right lower extremity is been seen as dermatologist for this. Fortunately they are definitely still working with him trying to keep things under control. [name], [name] ([id number]) 09/07/18 on evaluation today patient is actually doing rather well in regard to his left lateral lower extremity ulcer. He notes these actually having some hair grow back on his extremity which is something he has not seen in years. He also tells me that the pain is really not giving them any trouble at this time which is also good news overall she is very pleased with the progress he's using a combination of the mupirocin along with the probate is all mixed. 09/21/18 on evaluation today patient actually appears to be doing fairly well all things considered in regard to his looks from the ulcer. He's been tolerating the dressing changes without complication. Fortunately there's no signs of active infection at this time which is good news he is still on all antibiotics  or prevention of the staff infection. He has been on prednisone for time although he states it is gonna contact his dermatologist and see if she put them on a short course due to some irritation that he has going on currently. Fortunately there's no evidence of any overall worsening this is going very slow I think cental would be something that would be helpful for him although he states that $50 for tube is quite expensive. He therefore is not willing to get that at this point. 10/06/18 on evaluation today patient actually appears to be doing decently well in regard to his left lateral leg ulcer. He's been tolerating  the dressing changes without complication. Fortunately there's no signs of active infection at this time. Overall I'm actually rather pleased with the progress he's making although it's slow he doesn't show any signs of infection and he does seem to be making some improvement. I do believe that he may need a switch up and dressings to try to help this to heal more appropriately and quickly. 10/19/18 on evaluation today patient actually appears to be doing better in regard to his left lateral lower extremity ulcer. This is shown signs of having much less Slough buildup at this point due to the fact he has been using the [employer]. Obviously this is very good news. The overall size of the wound is not dramatically smaller but again the appearance is. 11/02/18 on evaluation today patient actually appears to be doing quite well in regard to his lower [place] ulcer. A lot of the skin around the ulcer is actually somewhat irritating at this point this seems to be more due to the dressing causing irritation from the adhesive that anything else. Fortunately there is no signs of active infection at this time. 11/24/18 on evaluation today patient appears to be doing a little worse in regard to his overall appearance of his lower extremity ulcer. There's more erythema and warmth around the wound unfortunately. He is currently on doxycycline which he has been on for some time. With that being said I'm not sure that seems to be helping with what appears to possibly be an acute cellulitis with regard to his left lower extremity ulcer. No fevers, chills, nausea, or vomiting noted at this time. 12/08/18 on evaluation today patient's wounds actually appears to be doing significantly better compared to his last evaluation. He has been using Santyl along with alternating tripling about appointment as well as the steroid cream seems to be doing quite well and the wound is showing signs of improvement which is excellent news.  Fortunately there's no evidence of infection and in fact his culture came back negative with only normal skin flora noted. 12/21/2018 upon evaluation today patient actually appears to be doing excellent with regard to his ulcer. This is actually the best that I have seen it since have been helping to take care of him. It is both smaller as well as less slough noted on the surface of the wound and seems to be showing signs of good improvement with new skin growing from the edges. He has been using just the triamcinolone he does wonder if he can get a refill of that ointment today. 01/04/2019 upon evaluation today patient actually appears to be doing well with regard to his left lateral lower extremity ulcer. With that being said it does not appear to be that he is doing quite as well as last time as far as progression is concerned. There does not appear  to be any signs of infection or significant irritation which is good news. With that being said I do believe that he may benefit from switching to a collagen based dressing based on how clean The wound appears. 01/18/2019 on evaluation today patient actually appears to be doing well with regard to his wound on the left lower extremity. He is not made a lot of progress compared to where we were previous but nonetheless does seem to be doing okay at this time which is good news. There is no signs of active infection which is also good news. My only concern currently is I do wish we can get him into utilizing the collagen dressing his insurance would not pay for the supplies that we ordered although it appears that he may be able to order this through his supply company that he typically utilizes. This is [employer]. Nonetheless he did try to order it during the office visit today and it appears this did go through. We will see if he can get that it is a different brand but nonetheless he has collagen and I do think will be beneficial. 02/01/2019 on evaluation  today patient actually appears to be doing a little worse today in regard to the overall size of his wounds. Fortunately there is no signs of active infection at this time. That is visually. Nonetheless when this is happened before it was due to infection. For that reason were somewhat concerned about that this time as well. 02/08/2019 on evaluation today patient unfortunately appears to be doing slightly worse with regard to his wound upon evaluation today. Is measuring a little deeper and a little larger unfortunately. I am not really sure exactly what is causing this to enlarge he actually did see his dermatologist she is going to see about initiating Humira for him. Subsequently she also did do steroid injections into the wound itself in the periphery. Nonetheless still nonetheless he seems to be getting a little bit larger he is gone back to just using the steroid cream topically which I think is appropriate. I would say hold off on the collagen for the time being is definitely a good thing to do. Based on the culture results which we finally did get the final result back regarding it shows staph as the bacteria noted again that can be a normal skin bacteria based on the fact however he is having increased drainage and worsening of the wound measurement wise I would go ahead and place him on an antibiotic today I do believe for this. 02/15/2019 on evaluation today patient actually appears to be doing somewhat better in regard to his ulcer. There is no signs of worsening at this time I did review his culture results which showed evidence of Staphylococcus aureus but not MRSA. Again this could just be more related to the normal skin bacteria although he states the drainage has slowed down quite a bit he may have had a mild infection not just colonization. And was much smaller and then since around10/04/2019 on evaluation today patient appears to be doing unfortunately worse as far as the size of the  wound. I really feel like that this is steadily getting larger again it had been doing excellent right at the beginning of September we have seen a steady increase in the area of the wound it is almost 2-1/2 times the size it was on September 1. Obviously this is a bad trend this is not wanting to see. For that reason we  went back to using just the topical triamcinolone cream which does seem to help with inflammation. I checked him for bacteria by way of culture and nothing showed positive there. I am considering giving him a short course of a tapering steroid Dosepak today to see if that is can be beneficial for him. The patient is in agreement with giving that a try. 03/08/2019 on evaluation today patient appears to be doing very well in comparison to last evaluation with regard to his lower extremity ulcer. This is showing signs of less inflammation and actually measuring slightly smaller compared to last time every other week over the past month and a half he has been measuring larger larger larger. Nonetheless I do believe that the issue has been inflammation the prednisone does seem to [name], [name] ([id number]) have been beneficial for him which is good news. No fevers, chills, nausea, vomiting, or diarrhea. 03/22/2019 on evaluation today patient appears to be doing about the same with regard to his leg ulcer. He has been tolerating the dressing changes without complication. With that being said the wound seems to be mostly arrested at its current size but really is not making any progress except for when we prescribed the prednisone. He did show some signs of dropping as far as the overall size of the wound during that interval week. Nonetheless this is something he is not on long-term at this point and unfortunately I think he is getting need either this or else the Humira which his dermatologist has discussed try to get approval for. With that being said he will be seeing his dermatologist on  the 11th of this month that is November. 04/19/2019 on evaluation today patient appears to be doing really about the same the wound is measuring slightly larger compared to last time I saw him. He has not been into the office since November 2 due to the fact that he unfortunately had Covid as that his entire family. He tells me that it was rough but they did pull-through and he seems to be doing much better. Fortunately there is no signs of active infection at this time. No fevers, chills, nausea, vomiting, or diarrhea. 05/10/2019 on evaluation today patient unfortunately appears to be doing significantly worse as compared to last time I saw him. He does tell me that he has had his first dose of Humira and actually is scheduled to get the next one in the upcoming week. With that being said he tells me also that in the past several days he has been having a lot of issues with green drainage she showed me a picture this is more blue-green in color. He is also been having issues with increased sloughy buildup and the wound does appear to be larger today. Obviously this is not the direction that we want everything to take based on the starting of his Humira. Nonetheless I think this is definitely a result of likely infection and to be honest I think this is probably Pseudomonas causing the infection based on what I am seeing. 05/24/2019 on evaluation today patient unfortunately appears to be doing significantly worse compared to his prior evaluation with me 2 weeks ago. I did review his culture results which showed that he does have Staph aureus as well as Pseudomonas noted on the culture. Nonetheless the Levaquin that I prescribed for him does not appear to have been appropriate and in fact he tells me he is no longer experiencing the green drainage and discharge that he  had at the last visit. Fortunately there is no signs of active infection at this time which is good news although the wound has  significantly worsened it in fact is much deeper than it was previous. We have been utilizing up to this point triamcinolone ointment as the prescription topical of choice but at this time I really feel like that the wound is getting need to be packed in order to appropriately manage this due to the deeper nature of the wound. Therefore something along the lines of an alginate dressing may be more appropriate. 05/31/2019 upon inspection today patient's wound actually showed signs of doing poorly at this point. Unfortunately he just does not seem to be making any good progress despite what we have tried. He actually did go ahead and pick up the Cipro and start taking that as he was noticing more green drainage he had previously completed the Levaquin that I prescribed for him as well. Nonetheless he missed his appointment for the seventh last week on Wednesday with the wound care center and [facility] where his dermatologist referred him. Obviously I do think a second opinion would be helpful at this point especially in light of the fact that the patient seems to be doing so poorly despite the fact that we have tried everything that I really know how at this point. The only thing that ever seems to have helped him in the past is when he was on high doses of continual steroids that did seem to make a difference for him. 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 06/07/2019 upon evaluation today patient unfortunately appears to be doing worse yet again with regard to his wound. In fact I am starting to question whether or not he may have a fluid pocket in the muscle at this point based on the bulging and the soft appearance to the central portion of the muscle area. There is not anything draining from the muscle itself at this time which is good  news but nonetheless the wound is expanding. I am not really seeing any results of the Humira as far as overall wound progression based on what I am seeing at this point. The patient has been referred for second opinion with regard to his wound to the [facility] wound care center by his dermatologist which I definitely am not in opposition to. Unfortunately we tried multiple dressings in the past including collagen, alginate, and at one point even Hydrofera Blue. With that being said he is never really used it for any significant amount of time due to the fact that he often complains of pain associated with these dressings and then will go back to either using the Santyl which she has done intermittently or more frequently the triamcinolone. He is also using his own compression stockings. We have wrapped him in the past but again that was something else that he really was not a big fan of. 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 He seemed to do much better when he was on the steroids although honestly I understand that the steroids are not necessarily the best medication to be on long-term obviously 06/14/2019 on evaluation today patient appears to be doing actually a little bit better with regard to the overall appearance with his leg. Unfortunately he does continue to have issues with what appears to be some fluid underneath the muscle although he did see the wound specialty center at [facility] last week their main goals were to see about infusion therapy in place of the Humira as they feel like that is not quite strong enough. They also recommended that we continue with the treatment  otherwise as we are they felt like that was appropriate and they are okay with him continuing to follow-up here with [place] in that regard. With that being said they are also sending him to the vein specialist there to see about vein stripping and if that would be of benefit for him. Subsequently they also did not really address whether or not an ultrasound of the muscle area to see if there is anything that needs to be addressed here would be appropriate or not. For that reason I discussed this with him last week I think we may proceed down that road at this point. 06/21/2019 upon evaluation today patient's wound actually appears to be doing slightly better compared to previous evaluations. I do believe that he has made a difference with regard to the progression here with the use of oral steroids. Again in the past has been the only thing that is really calm things down. He does tell me that from [name] is gotten a good news from there that there are no further vein stripping that is necessary at this point. I do not have that available for review today although the patient did relay this to me. He also did obtain and have the ultrasound of the wound completed which I did sign off on today. It does appear that there is no fluid collection under the muscle this is likely then just edematous tissue in general. That is also good news. Overall I still believe the inflammation is the main issue here. He did inquire about the possibility of a wound VAC again with the muscle protruding like it is I am not really sure whether the wound VAC is necessarily ideal or not. That is something we will have to consider although I do believe he may need compression wrapping to try to help with edema control which could potentially be of benefit. 06/28/2019 on evaluation today patient appears to be doing slightly better measurement wise although this is not terribly smaller he least seems to be trending towards that direction.  With that being said he still seems to have purulent drainage noted in the wound bed at this time. He has been on Levaquin followed by Cipro over the past month. Unfortunately he still seems to have some issues with active infection at this time. I did perform a culture last week in order to evaluate and see if indeed there was still anything going on. Subsequently the culture did come back showing Pseudomonas which is consistent with the drainage has been having which is blue-green in color. He also has had an odor that again was somewhat consistent with Pseudomonas as well. Long story short it appears that the culture showed an intermediate finding with regard to how well the Cipro will work for the Pseudomonas infection.  Subsequently being that he does not seem to be clearing up and at best what we are doing is just keeping this at [place] I think he may need to see infectious disease to discuss IV antibiotic options. [name], [name] ([id number]) 07/05/2019 upon evaluation today patient appears to be doing okay in regard to his leg ulcer. He has been tolerating the dressing changes at this point without complication. Fortunately there is no signs of active infection at this time which is good news. No fevers, chills, nausea, vomiting, or diarrhea. With that being said he does have an appointment with infectious disease tomorrow and his primary care on Wednesday. Again the reason for the infectious disease referral was due to the fact that he did not seem to be fully resolving with the use of oral antibiotics and therefore we were thinking that IV antibiotic therapy may be necessary secondary to the fact that there was an intermediate finding for how effective the Cipro may be. Nonetheless again he has been having a lot of purulent and even green drainage. Fortunately right now that seems to have calmed down over the past week with the reinitiation of the oral antibiotic. Nonetheless we will see what Dr.  Megan Salon has to say. 07/12/2019 upon evaluation today patient appears to be doing about the same at this point in regard to his left lower extremity ulcer. Fortunately there is no signs of active infection at this time which is good news I do believe the Levaquin has been beneficial I did review Dr. Hale Bogus note and to be honest I agree that the patient's leg does appear to be doing better currently. What we found in the past as he does not seem to really completely resolve he will stop the antibiotic and then subsequently things will revert back to having issues with blue-green drainage, increased pain, and overall worsening in general. Obviously that is the reason I sent him back to infectious disease. 07/19/2019 upon evaluation today patient appears to be doing roughly the same in size there is really no dramatic improvement. He has started back on the Levaquin at this point and though he seems to be doing okay he did still have a lot of blue/green drainage noted on evaluation today unfortunately. I think that this is still indicative more likely of a Pseudomonas infection as previously noted and again he does see Dr. Megan Salon in just a couple of days. I do not know that were really able to effectively clear this with just oral antibiotics alone based on what I am seeing currently. Nonetheless we are still continue to try to manage as best we can with regard to the patient and his wound. I do think the wrap was helpful in decreasing the edema which is excellent news. No fevers, chills, nausea, vomiting, or diarrhea. 07/26/2019 upon evaluation today patient appears to be doing slightly better with regard to the overall appearance of the muscle there is no dark discoloration centrally. Fortunately there is no signs of active infection at this time. No fevers, chills, nausea, vomiting, or diarrhea. Patient's wound bed currently the patient did have an appointment with Dr. Megan Salon at [facility]  last week. With that being said Dr. Megan Salon the patient states was still somewhat hesitant about put him on any IV antibiotics he wanted [place] to repeat cultures today and then see where things go going forward. He does look like Dr. Megan Salon because of some improvement the patient did have with the Levaquin wanted [place] to see  about repeating cultures. If it indeed grows the Pseudomonas again then he recommended a possibility of considering a PICC line placement and IV antibiotic therapy. He plans to see the patient back in 1 to 2 weeks. 08/02/2019 upon evaluation today patient appears to be doing poorly with regard to his left lower extremity. We did get the results of his culture back it shows that he is still showing evidence of Pseudomonas which is consistent with the purulent/blue-green drainage that he has currently. Subsequently the culture also shows that he now is showing resistance to the oral fluoroquinolones which is unfortunate as that was really the only thing to treat the infection prior. I do believe that he is looking like this is going require IV antibiotic therapy to get this under control. Fortunately there is no signs of systemic infection at this time which is good news. The patient does see Dr. Megan Salon tomorrow. 08/09/2019 upon evaluation today patient appears to be doing better with regard to his left lower extremity ulcer in regard to the overall appearance. He is currently on IV antibiotic therapy. As ordered by Dr. Megan Salon. Currently the patient is on ceftazidime which she is going to take for the next 2 weeks and then follow-up for 4 to 5-week appointment with Dr. Megan Salon. The patient started this this past Friday symptoms have not for a total of 3 days currently in full. 08/16/2019 upon evaluation today patient's wound actually does show muscle in the base of the wound but in general does appear to be much better as far as the overall evidence of infection is concerned. In  fact I feel like this is for the most part cleared up he still on the IV antibiotics he has not completed the full course yet but I think he is doing much better which is excellent news. 08/23/2019 upon evaluation today patient appears to be doing about the same with regard to his wound at this point. He tells me that he still has pain unfortunately. Fortunately there is no evidence of systemic infection at this time which is great news. There is significant muscle protrusion. 09/13/19 upon evaluation today patient appears to be doing about the same in regard to his leg unfortunately. He still has a lot of drainage coming from the ulceration there is still muscle exposed. With that being said the patient's last wound culture still showed an intermediate finding with regard to the Pseudomonas he still having the bluish/green drainage as well. Overall I do not know that the wound has completely cleared of infection at this point. Fortunately there is no signs of active infection systemically at this point which is good news. 09/20/2019 upon evaluation today patient's wound actually appears to be doing about the same based on what I am seeing currently. I do not see any signs of systemic infection he still does have evidence of some local infection and drainage. He did see Dr. Megan Salon last week and Dr. Megan Salon states that he probably does need a different IV antibiotic although he does not want to put him on this until the patient begins the Remicade infusion which is actually scheduled for about 10 days out from today on 13 May. Following that time Dr. Megan Salon is good to see him back and then will evaluate the feasibility of starting him on the IV antibiotic therapy once again at that point. I do not disagree with this plan I do believe as Dr. Megan Salon stated in his note that I reviewed today that  the patient's issue is multifactorial with the pyoderma being 1 aspect of this that were hoping the Remicade  will be helpful for her. In the meantime I think the gentamicin is, helping to keep things under decent okay control in regard to the ulcer. 09/27/2019 upon evaluation today patient appears to be doing about the same with regard to his wound still there is a lot of muscle exposure though he does have some hyper granulation tissue noted around the edge and actually some granulation tissue starting to form over the muscle which is actually good news. Fortunately there is no evidence of active infection which is also good news. His pain is less at this point. 5/21; this is a patient I have not seen in a long time. 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 He has a large wound on the left lateral leg with protruding muscle. He comes in the clinic today showing the same area on his left medial ankle. He says there is been a spot there for some time although we have not previously defined this. Today he has a clearly defined area with slight amount of skin breakdown surrounded by raised areas with a purplish hue in color. This is not painful he says it is irritated. 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 He still has muscle protruding from the lateral portion of his left leg but fortunately the new area that they were concerned about at his last visit does not appear to have opened at this point. He is currently on Remicade infusions and seems to be doing better in my opinion in fact the wound itself seems to be overall much better. The purplish discoloration that he did have seems to have resolved and I think that is a good sign that hopefully the Remicade is doing its job. He does have some biofilm noted over the surface of the wound. 11/01/2019 on evaluation today patient's wound actually appears to be doing excellent at this time. Fortunately there is no  evidence of active infection and overall I feel like he is making great progress. The Remicade seems to be due excellent job in my opinion. TASHON, CAPP ([id number]) 11/08/19 evaluation today vision actually appears to be doing quite well with regard to his weight ulcer. He's been tolerating dressing changes without complication. Fortunately there is no evidence of infection. No fevers, chills, nausea, or vomiting noted at this time. Overall states that is having more itching than pain which is actually a good sign in my opinion. 12/13/2019 upon evaluation today patient appears to be doing well today with regard to his wound. He has been tolerating the dressing changes without complication. Fortunately there is no sign of active infection at this time. No fevers, chills, nausea, vomiting, or diarrhea. Overall I feel like the infusion therapy has been very beneficial for him. 01/06/2020 on evaluation today patient appears to be doing well with regard to his wound. This is measuring smaller and actually looks to be doing better. Fortunately there is no signs of active infection at this point. No fevers, chills, nausea, vomiting, or diarrhea. With that being said he does still have the blue-green drainage but this does not seem to be causing any significant issues currently. He has been using the gentamicin that does seem to be keeping things under decent control at this point. He goes later this morning for his next  infusion therapy for the pyoderma which seems to also be very beneficial. 02/07/2020 on evaluation today patient appears to be doing about the same in regard to his wounds currently. Fortunately there is no signs of active infection systemically he does still have evidence of local infection still using gentamicin. He also is showing some signs of improvement albeit slowly I do feel like we are making some progress here. 02/21/2020 upon evaluation today patient appears to be making some signs  of improvement the wound is measuring a little bit smaller which is great news and overall I am very pleased with where he stands currently. He is going to be having infusion therapy treatment on the 15th of this month. Fortunately there is no signs of active infection at this time. 03/13/2020 I do believe patient's wound is actually showing some signs of improvement here which is great news. He has continue with the infusion therapy through rheumatology/dermatology at [facility]. That does seem to be beneficial. I still think he gets as much benefit from this as he did from the prednisone initially but nonetheless obviously this is less harsh on his body that the prednisone as far as they are concerned. 03/31/2020 on evaluation today patient's wound actually showing signs of some pretty good improvement in regard to the overall appearance of the wound bed. There is still muscle exposed though he does have some epithelial growth around the edges of the wound. Fortunately there is no signs of active infection at this time. No fevers, chills, nausea, vomiting, or diarrhea. 04/24/2020 upon evaluation today patient appears to be doing about the same in regard to his leg ulcer. He has been tolerating the dressing changes without complication. Fortunately there is no signs of active infection at this time. No fevers, chills, nausea, vomiting, or diarrhea. With that being said he still has a lot of irritation from the bandaging around the edges of the wound. We did discuss today the possibility of a referral to plastic surgery. 05/22/2020 on evaluation today patient appears to be doing well with regard to his wounds all things considered. He has not been able to get the Chantix apparently there is a recall nurse that I was unaware of put out by [employer] involuntarily. Nonetheless for now I am and I have to do some research into what may be the best option for him to help with quitting in regard to smoking and we  discussed that today. 06/26/2020 upon evaluation today patient appears to be doing well with regard to his wound from the standpoint of infection I do not see any signs of infection at this point. With that being said unfortunately he is still continuing to have issues with muscle exposure and again he is not having a whole lot of new skin growth unfortunately. There does not appear to be any signs of active infection at this time. No fevers, chills, nausea, vomiting, or diarrhea. 07/10/2020 upon evaluation today patient appears to be doing a little bit more poorly currently compared to where he was previous. I am concerned currently about an active infection that may be getting worse especially in light of the increased size and tenderness of the wound bed. No fevers, chills, nausea, vomiting, or diarrhea. 07/24/2020 upon evaluation today patient appears to be doing poorly in regard to his leg ulcer. He has been tolerating the dressing changes without complication but unfortunately is having a lot of discomfort. Unfortunately the patient has an infection with Pseudomonas resistant to gentamicin as well  as fluoroquinolones. Subsequently I think he is going require possibly IV antibiotics to get this under control. I am very concerned about the severity of his infection and the amount of discomfort he is having. 07/31/2020 upon evaluation today patient appears to be doing about the same in regard to his leg wound. He did see Dr. Megan Salon and Dr. Megan Salon is actually going to start him on IV antibiotics. He goes for the PICC line tomorrow. With that being said there do not have that run for 2 weeks and then see how things are doing and depending on how he is progressing they may extend that a little longer. Nonetheless I am glad this is getting ready to be in place and definitely feel it may help the patient. In the meantime is been using mainly triamcinolone to the wound bed has an  anti-inflammatory. 08/07/2020 on evaluation today patient appears to be doing well with regard to his wound compared even last week. In the interim he has gotten the PICC line placed and overall this seems to be doing excellent. There does not appear to be any evidence of infection which is great news systemically although locally of course has had the infection this appears to be improving with the use of the antibiotics. 08/14/2020 upon evaluation today patient's wound actually showing signs of excellent improvement. Overall the irritation has significantly improved the drainage is back down to more of a normal level and his pain is really pretty much nonexistent compared to what it was. Obviously I think that this is significantly improved secondary to the IV antibiotic therapy which has made all the difference in the world. Again he had a resistant form of Pseudomonas for which oral antibiotics just was not cutting it. Nonetheless I do think that still we need to consider the possibility of a surgical closure for this wound is been open so long and to be honest with muscle exposed I think this can be very hard to get this to close outside of this although definitely were still working to try to do what we can in that regard. 08/21/2020 upon evaluation today patient appears to be doing very well with regard to his wounds on the left lateral lower extremity/calf area. Fortunately there does not appear to be signs of active infection which is great news and overall very pleased with where things stand today. He is actually wrapping up his treatment with IV antibiotics tomorrow. After that we will see where things go from there. 08/28/2020 upon evaluation today patient appears to be doing decently well with regard to his leg ulcer. There does not appear to be any signs of active infection which is great news and overall very pleased with where things stand today. No fevers, chills, nausea, vomiting, or  diarrhea. 09/18/2020 upon evaluation today patient appears to be doing well with regard to his infection which I feel like is better. Unfortunately he is not doing as well with regard to the overall size of the wound which is not nearly as good at this point. I feel like that he may be having an issue here with the pyoderma being somewhat out of control. I think that he may benefit from potentially going back and talking to the dermatologist about VINAYAK, BOBIER ([id number]) what to do from the pyoderma standpoint. I am not certain if the infusions are helping nearly as much is what the prednisone did in the past. 10/02/2020 upon evaluation today patient appears to be doing well with  regard to his leg ulcer. He did go to the Psychiatric nurse. Unfortunately they feel like there is a 10% chance that most that he would be able to heal and that the skin graft would take. Obviously this has led him to not be able to go down that path as far as treatment is concerned. Nonetheless he does seem to be doing a little bit better with the prednisone that I gave him last time. I think that he may need to discuss with dermatology the possibility of long-term prednisone as that seems to be what is most helpful for him to be perfectly honest. I am not sure the Remicade is really doing the job. 10/17/2020 upon evaluation today patient appears to be doing a little better in regard to his wound. In fact the case has been since we did the prednisone on May 2 for him that we have noticed a little bit of improvement each time we have seen a size wise as well as appearance wise as well as pain wise. I think the prednisone has had a greater effect then the infusion therapy has to be perfectly honest. With that being said the patient also feels significantly better compared to what he was previous. All of this is good news but nonetheless I am still concerned about the fact that again we are really not set up to long-term manage him  as far as prednisone is concerned. Obviously there are things that you need to be watched I completely understand the risk of prednisone usage as well. 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 With all that being said I do believe that we can give him another round of the prednisone which she is requesting today because of the improvement that he seen since we did that first round. 10/30/2020 upon evaluation today patient's wound actually is showing signs of doing quite well. There does not appear to be any evidence of infection which is great news and overall very pleased with where things stand today. No fevers, chills, nausea, vomiting, or diarrhea. He tells me that the prednisone still has seem to have helped he wonders if we can extend that for just a little bit longer. He did not have the appointment with a dermatologist although he did have an infusion appointment last Friday. That was at [facility]. With that being said he tells me he could not do both that as well as the appointment with the physician on the same day therefore that is can have to be rescheduled. I really want to see if there is anything they feel like that could be done differently to try to help this out as I am not really certain that the infusions are helping significantly here. 11/13/2020 upon evaluation today patient unfortunately appears to be doing somewhat poorly in regard to his wound I feel like this is actually worsening from the standpoint of the pyoderma spreading. I still feel like that he may need something different as far as trying to manage this going forward. Again we did the prednisone unfortunately his blood sugars are not doing so well because of this. Nonetheless I believe that the patient likely needs to try topical steroid. We have done triamcinolone for a while I think going with something stronger such as clobetasol could be beneficial again this is not  something I do lightly I discussed this with the patient that again this does not normally put underneath  an occlusive dressing. Nonetheless I think a thin film as such could help with some of the stronger anti-inflammatory effects. We discussed this today. He would like to try to give this a trial for the next couple weeks. I definitely think that is something that we can do. Evaluate7/03/2021 and today patient's wound bed actually showed signs of doing really about the same. There was a little expansion of the size of the wound and that leading edge that we done looking out although the clobetasol does seem to have slowed this down a bit in my opinion. There is just 1 small area that still seems to be progressing based on what I see. Nonetheless I am concerned about the fact this does not seem to be improving if anything seems to be doing a little bit worse. I do not know that the infusions are really helping him much as next infusion is August 5 his appointment with dermatology is July 25. Either way I really think that we need to have a conversation potentially about this and I am actually going to see if I can talk with Dr. Lillia Carmel in order to see where things stand as well. 12/11/2020 upon evaluation today patient appears to be doing worse in regard to his leg ulcer. Unfortunately I just do not think this is making the progress that I would like to see at this point. Honestly he does have an appointment with dermatology and this is in 2 days. I am wondering what they may have to offer to help with this. Right now what I am seeing is that he is continuing to show signs of worsening little by little. Obviously that is not great at all. Is the exact opposite of what we are looking for. 12/18/2020 upon evaluation today patient appears to be doing a little better in regard to his wound. The dermatologist actually did do some steroid injections into the wound which does seem to have been beneficial in  my opinion. That was on the 25th already this looks a little better to me than last time I saw him. With that being said we did do a culture and this did show that he has Staph aureus noted in abundance in the wound. With that being said I do think that getting him on an oral antibiotic would be appropriate as well. Also think we can compression wrap and this will make a difference as well. 12/28/2020 upon evaluation today patient's wound is actually showing signs of doing much better. I do believe the compression wrap is helping he has a lot of drainage but to be honest I think that the compression is helping to some degree in this regard as well as not draining through which is also good news. No fevers, chills, nausea, vomiting, or diarrhea. 01/04/2021 upon evaluation today patient appears to be doing well with regard to his wound. Overall things seem to be doing quite well. He did have a little bit of reaction to the CarboFlex Sorbact he will be using that any longer. With that being said he is controlled as far as the drainage is concerned overall and seems to be doing quite well. I do not see any signs of active infection at this time which is great news. No fevers, chills, nausea, vomiting, or diarrhea. 01/11/2021 upon evaluation today patient appears to be doing well with regard to his wounds. He has been tolerating the dressing changes without complication. Fortunately there does not appear to be any signs  of active infection at this time which is great news. Overall I am extremely pleased with where we stand currently. No fevers, chills, nausea, vomiting, or diarrhea. Where using clobetasol in the wound bed he has a lot of new skin growth which is awesome as well. 01/18/2021 upon evaluation today patient appears to be doing very well in regard to his leg ulcer. He has been tolerating the dressing changes without complication. Fortunately there does not appear to be any signs of active infection  which is great news. In general I think that he is making excellent progress 01/25/2021 upon evaluation today patient appears to be doing well with regard to his wound on the leg. I am actually extremely pleased with where things stand today. There does not appear to be any signs of active infection which is great news and overall I think that we are definitely headed in the appropriate direction based on what I am seeing currently. There does not appear to be any signs of active infection also excellent news. 02/06/2021 upon evaluation today patient appears to be doing well with regard to his wound. Overall visually this is showing signs of significant improvement which is great news. I do not see any signs of active infection systemically which is great even locally I do not think that we are seeing any major complications here. We did do fluorescence imaging with the [employer] today. The patient does have some odor and drainage noted and again this is something that I think would benefit him to probably come more frequently for nurse visits. 02/19/2021 upon evaluation today patient actually appears to be doing quite well in regard to his wound. He has been tolerating the dressing Moede, Cedarius ([id number]) changes without complication and overall I think that this is making excellent progress. I do not see any evidence of active infection at this point which is great news as well. No fevers, chills, nausea, vomiting, or diarrhea. 10/10; wound is made nice progress healthy granulation with a nice rim of epithelialization which seems to be expanding even from last week he has a deeper area in the inferior part of the more distal part of the wound with not quite as healthy as surface. This area will need to be followed. Using clobetasol and Hydrofera Blue 03/05/2021 upon evaluation today patient appears to be doing very well in regard to his leg ulcer. He has been tolerating dressing changes without  complication. Fortunately there does not appear to be any signs of active infection which is great news and overall I am extremely pleased with where we stand currently. 03/12/2021 upon evaluation today patient appears to be doing well with regard to his wound in fact this is extremely extremely good based on what we are seeing today there does not appear to be any signs of active infection and overall I think that he is doing awesome from the standpoint of healing in general. I am extremely pleased with how things seem to be progressing with regard to this pyoderma. Clobetasol has done wonders for him. I think the compression wrapping has also been of great benefit. 03/19/2021 upon evaluation today patient appears to be doing well with regard to his wound. He is tolerating the dressing changes without complication. In fact I feel like that he is actually making excellent progress at this point based on what I am seeing. No fevers, chills, nausea, vomiting, or diarrhea. 03/26/2021 upon evaluation today patient appears to be doing well with regard to his  wound. This again is measuring smaller and looking better. Again the progress is slow but nonetheless continual with what we have been seeing. I do believe that the current plan is doing awesome for him. 04/09/2021 upon evaluation today patient appears to be doing well with regard to his leg ulcer. This is showing signs of excellent improvement the muscle is completely closed over and there does not appear to be any evidence of inflammation at this point his drainage is significantly improved. Overall I think that he would be a good candidate for looking into a skin substitute at this point as well. We will get a look into some approvals in that regard. Potentially TheraSkin as well as Apligraf could both be considered just depending on insurance coverage. 04/16/2021 upon evaluation today patient appears to be doing well at this point. He has been  approved for the Apligraf which we could definitely order although I would like to try to get the TheraSkin approved if at all possible. I did fax notes into them today and I Georgina Peer try to give a call as well. Overall the wound appears to be doing decently well today. 04/20/2021 upon evaluation today patient actually appears to be doing quite well in regard to his wound with that being said we are trying to see what we can do about speeding up the healing process. For that reason we did discuss the possibility of a skin substitute. We got the Apligraf approved. For that reason we will get a go ahead and see what we can do with the Apligraf at this point. I am still trying to get the TheraSkin approved but I have not heard anything from the insurance company yet I have called and talked to them earlier in the week and to be honest this was about half an hour that I spent on the phone they told me I would hear something within 10-15 business days. 04/27/2021 upon evaluation today patient appears to be doing excellent in regard to his wound. I do believe the Apligraf has been beneficial. With that being said he has had a little bit of increased pain has been a little bit concerned. I do want to go ahead and apply his steroid cream today the clobetasol and then put the Apligraf over I just do not think I want to risk not putting the clobetasol on based on what is been going on here currently. Patient voiced understanding. 05/04/2021 upon evaluation today patient is making excellent improvement. Overall there is definitely decrease in the size of the wound today and I am very pleased in that regard. I do not see any signs of active infection locally nor systemically at this point. No fevers, chills, nausea, vomiting, or diarrhea. 05/11/2021 upon evaluation today patient appears to be doing well with regard to his wound. He has been tolerating Apligraf's without complication and is making excellent progress  this is application #4 today. 12/30; patient here for Apligraf application. Appears to be doing well small wound there has been a lot of healing 05/28/2021 upon evaluation today patient appears to be doing well with regard to his wound. Has been tolerating the dressing changes without complication. Fortunately I do not see any signs of active infection locally nor systemically at this point which is great news. He is done with the Apligraf and the first round and overall this has filled in quite nicely I am not even certain he needs any additional at the moment. I would actually try to  go back to clobetasol with [facility] which previously was doing well for him. 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 He is using clobetasol and Hydrofera Blue. Surface area of the wound is smaller and the surface looks very healthy. 06/11/2021 upon evaluation today patient actually appears to be showing signs of good improvement which is great news. Fortunately I do not see any signs of active infection at this time which is also excellent news. I do believe that the patient is doing well with the clobetasol and Hydrofera Blue. He does have some irritation and itching around the rest of the leg will use some triamcinolone over this area. 06/18/2021 upon evaluation today patient appears to be doing well with regard to his wound. He is showing signs of excellent improvement and overall I am extremely pleased with where we stand today. We are moving in the right direction. 06/25/2021 upon evaluation today patient appears to be doing well with regard to his wound. In fact this is doing also not showing signs of excellent improvement overall very pleased with where we stand today. I do not see any evidence of active infection locally or systemically at this time which is also great news. 07/02/2021 upon evaluation patient's wound bed actually showed signs of good  granulation and epithelization at this point. And this is measuring significantly smaller and overall I am extremely pleased with where we stand today. No fevers, chills, nausea, vomiting, or diarrhea. 07/09/2021 upon evaluation patient's wound bed actually showed signs of good granulation and epithelization at this point. Fortunately there does not appear to be any evidence of active infection locally or systemically which is great news and overall I am extremely pleased with where we stand at this point. 07/16/2021 upon evaluation today patient appears to be doing well with regard to his wound all things considered although it is maybe a little bit larger than what its been. This does have me a little concerned. Actually question about whether anything is changed and initially he told me HAIDAR, MUSE ([id number]) know. In fact it was not until the end of the visit that he actually did mention that he had missed his infusion in January. This very well may be what is because the difference is also seeing currently. That definitely has seem to been helping more recently. 07/23/2021 upon evaluation today patient's wound is yet again larger despite the switch to a collagen which was not beneficial. Subsequently I am going to at this point check on a couple things. First and foremost I do want to go ahead and do a culture. I think that there is a chance he could have a low-lying infection that may be part of the issue here. Subsequently I am also probably can go ahead and place him on Bactrim DS which I think is a good option and has done well with them in the past. Again if this is an infection that should hopefully help to turn things around. With all that being said I do believe that the patient should hopefully be able to turn this around with reinitiation of the infusion therapy which will be going on this week and subsequently getting on the antibiotic. 3/13; patient presents for follow-up. He has no  issues or complaints today. He states he is currently on oral antibiotics for previous culture done in the office. 08/06/2021 upon evaluation today patient appears to be doing well with regard to his wound. I am actually  seeing signs of improvement which is great news. I think that he is on a better track he did receive his infusion as well which is great news and overall I think that this should hopefully get him back on track as far as healing is concerned. He is not having any pain and I do not see any signs of inflammation which is great news. 08/13/2021 upon evaluation today patient appears to be doing excellent in regard to his wound on the leg. He has been tolerating the dressing changes without complication. Fortunately I do not see any signs of infection I do believe he is making good progress here and I think her back on track overall the tissue is greatly improved compared to what we have been. 08/20/2021 upon evaluation today patient appears to be doing excellent in regard to his wound. He has been tolerating the dressing changes without complication. Fortunately I do not see any evidence of infection at this time locally or systemically which is great news and overall very pleased with where we stand. 08-31-2021 upon evaluation today patient appears to be doing excellent in regard to his wound. Overall even with the vacation and extra walking he seems to have really done well. Fortunately I do not see any evidence of active infection locally or systemically which is great news and overall I think we are headed in the right direction. 09-10-2021 upon evaluation today patient's wound is showing signs of improvement. This is a small improvement but nonetheless we seem to be making progress here. I am very pleased in that regard. There does not appear to be any signs of infection and overall things are doing quite nicely. Upon inspection patient's wound bed actually showed signs of good  granulation and epithelization at this point. There was some irritation around the edges of the wound where the good skin was it almost appears that something was rubbing or else is was due from drainage and irritation. Nonetheless I am going to see about putting a little bit of zinc just around the wound opening in order to protect the skin from being damaged. The patient is in agreement with that plan. 5/8; its been a while since I have seen this wound and it looks considerably better although still with some depth. Per our intake nurse the dimensions have improved patient has been using a thin layer of clobetasol, Hydrofera Blue under 4-layer compression. He appears to be making progress 10-01-2021 upon evaluation today patient appears to be doing well with regard to his wound although it is somewhat stalled I feel like were basically at a standstill here. He has previously had Apligraf which did very well for him to be honest. I do believe that he may benefit from a repeat of the Apligraf. In fact it got so small at that point but I do not think we even used all of the applications that were recommended as this had improved to such a degree. Nonetheless I do believe that the patient currently could benefit from repeat treatment with the Apligraf due to the improvement this that we did see. He is not opposed to this and subsequently working to look into getting approval to reinstitute that treatment. 10-08-2021 upon evaluation today patient appears to be doing well with regard to his wound. Overall I feel like he is doing a little bit better from the standpoint of irritation at this time which is great news. I do not see any signs of active infection locally or  systemically which is great news as well. No fevers, chills, nausea, vomiting, or diarrhea. 10-12-2021 upon evaluation today patient appears to be doing well with regard to his wound stable although we are can initiate treatment with Apligraf  today. I am hoping this will stimulate things to improve. Patient is in agreement with that plan. 10-19-2028 upon evaluation today patient appears to be doing well with regard to his wound. He has been tolerating the dressing changes without complication. Fortunately there does not appear to be any evidence of active infection locally or systemically at this time which is great news. No fevers, chills, nausea, vomiting, or diarrhea. I do believe the Apligraf has improved the overall appearance of the wound bed receiving some definite improvements here. Electronic Signature(s) Signed: 10/19/2021 9:03:17 AM By: Worthy Keeler PA-C Entered By: Worthy Keeler on 10/19/2021 09:03:17 [name], [name] ([id number]) -------------------------------------------------------------------------------- Physical Exam Details Patient Name: [name] Date of Service: 10/19/2021 8:15 AM Medical Record Number: [id number] Patient Account Number: [id number] Date of Birth/Sex: 11-15-1980 (40 y.o. M) Treating RN: [name] Primary Care Provider: [name] Other Clinician: Massie Kluver Referring Provider: [name] Treating Provider/Extender: [name] Cliche in Treatment: 254 Constitutional Obese and well-hydrated in no acute distress. Respiratory normal breathing without difficulty. Psychiatric this patient is able to make decisions and demonstrates good insight into disease process. Alert and Oriented x 3. pleasant and cooperative. Notes Upon inspection patient's wound again showed good granulation and even some good epithelization at this point. Actually I am very pleased with the appearance I think we are on the right track Electronic Signature(s) Signed: 10/19/2021 9:03:30 AM By: Worthy Keeler PA-C Entered By: Worthy Keeler on 10/19/2021 09:03:30 Barbato, [name] ([id number]) -------------------------------------------------------------------------------- Physician Orders Details Patient  Name: [name] Date of Service: 10/19/2021 8:15 AM Medical Record Number: [id number] Patient Account Number: [id number] Date of Birth/Sex: 1980/08/10 (40 y.o. M) Treating RN: [name] Primary Care Provider: [name] Other Clinician: Massie Kluver Referring Provider: [name] Treating Provider/Extender: [name] Cliche in Treatment: [id number] Verbal / Phone Orders: No Diagnosis Coding ICD-10 Coding Code Description I87.2 Venous insufficiency (chronic) (peripheral) L97.222 Non-pressure chronic ulcer of left calf with fat layer exposed E11.622 Type 2 diabetes mellitus with other skin ulcer L88 Pyoderma gangrenosum F17.208 Nicotine dependence, unspecified, with other nicotine-induced disorders Follow-up Appointments o Return Appointment in 1 week. o Nurse Visit as needed - twice a week Bathing/ Shower/ Hygiene o Clean wound with Normal Saline or wound cleanser. o May shower with wound dressing protected with water repellent cover or cast protector. o No tub bath. Cellular or Tissue Based Products Wound #1 Left,Lateral Lower Leg o Cellular or Tissue Based Product Type: - Apligraf #2 applied o Cellular or Tissue Based Product applied to wound bed; including contact layer, fixation with steri-strips, dry gauze and cover dressing. (DO NOT REMOVE). Edema Control - Lymphedema / Segmental Compressive Device / Other o Optional: One layer of unna paste to top of compression wrap (to act as an anchor). - He needs this to hold it up o Elevate, Exercise Daily and Avoid Standing for Long Periods of Time. o Elevate legs to the level of the heart and pump ankles as often as possible o Elevate leg(s) parallel to the floor when sitting. Wound Treatment Wound #1 - Lower Leg Wound Laterality: Left, Lateral Cleanser: Wound Cleanser 3 x Per Week/30 Days Discharge Instructions: Wash your hands with soap and water. Remove old dressing, discard into plastic bag and  place into  trash. Cleanse the wound with Wound Cleanser prior to applying a clean dressing using gauze sponges, not tissues or cotton balls. Do not scrub or use excessive force. Pat dry using gauze sponges, not tissue or cotton balls. Peri-Wound Care: Desitin Maximum Strength Ointment 4 (oz) 3 x Per Week/30 Days Discharge Instructions: Apply around wound edges to avoid maceration Peri-Wound Care: Triamcinolone Acetonide Cream, 0.1%, 15 (g) tube 3 x Per Week/30 Days Discharge Instructions: Apply to leg Topical: Clobetasol Propionate ointment 0.05%, 60 (g) tube 3 x Per Week/30 Days Discharge Instructions: Pt to bring to appt- applied to wound bed Secondary Dressing: (NON-BORDER) Zetuvit Plus Silicone NON-BORDER 5x5 (in/in) 3 x Per Week/30 Days Compression Wrap: Medichoice 4 layer Compression System, 35-40 mmHG (Generic) 3 x Per Week/30 Days Discharge Instructions: Apply multi-layer wrap as directed. Electronic Signature(s) SRIRAM, FEBLES ([id number]) Signed: 10/19/2021 12:57:21 PM By: Massie Kluver Signed: 10/19/2021 4:36:57 PM By: Worthy Keeler PA-C Entered By: Massie Kluver on 10/19/2021 09:10:19 [name], FORGET ([id number]) -------------------------------------------------------------------------------- Problem List Details Patient Name: [name] Date of Service: 10/19/2021 8:15 AM Medical Record Number: [id number] Patient Account Number: [id number] Date of Birth/Sex: 1980-09-09 (41 y.o. M) Treating RN: [name] Primary Care Provider: [name] Other Clinician: Massie Kluver Referring Provider: [name] Treating Provider/Extender: [name] Cliche in Treatment: 254 Active Problems ICD-10 Encounter Code Description Active Date MDM Diagnosis I87.2 Venous insufficiency (chronic) (peripheral) 12/04/2016 No Yes L97.222 Non-pressure chronic ulcer of left calf with fat layer exposed 12/04/2016 No Yes E11.622 Type 2 diabetes mellitus with other skin ulcer 04/09/2021 No  Yes L88 Pyoderma gangrenosum 03/26/2017 No Yes F17.208 Nicotine dependence, unspecified, with other nicotine-induced disorders 04/24/2020 No Yes Inactive Problems ICD-10 Code Description Active Date Inactive Date L97.213 Non-pressure chronic ulcer of right calf with necrosis of muscle 04/02/2017 04/02/2017 Resolved Problems ICD-10 Code Description Active Date Resolved Date L97.321 Non-pressure chronic ulcer of left ankle limited to breakdown of skin 10/08/2019 10/08/2019 L03.116 Cellulitis of left lower limb 05/24/2019 05/24/2019 Electronic Signature(s) Signed: 10/19/2021 8:21:56 AM By: Worthy Keeler PA-C Entered By: Worthy Keeler on 10/19/2021 08:21:56 [name] ([id number]) -------------------------------------------------------------------------------- Progress Note Details Patient Name: [name] Date of Service: 10/19/2021 8:15 AM Medical Record Number: [id number] Patient Account Number: [id number] Date of Birth/Sex: Oct 06, 1980 (40 y.o. M) Treating RN: [name] Primary Care Provider: [name] Other Clinician: Massie Kluver Referring Provider: [name] Treating Provider/Extender: [name] Cliche in Treatment: 254 Subjective Chief Complaint Information obtained from Patient He is here in follow up evaluation for LLE pyoderma ulcer History of Present Illness (HPI) 12/04/16; 41 year old man who comes into the clinic today for review of a wound on the posterior left calf. He tells me that is been there for about a year. He is not a diabetic he does smoke half a pack per day. He was seen in the ER on 11/20/16 felt to have cellulitis around the wound and was given clindamycin. An x-ray did not show osteomyelitis. The patient initially tells me that he has a milk allergy that sets off a pruritic itching rash on his lower legs which she scratches incessantly and he thinks that's what may have set up the wound. He has been using various topical antibiotics and ointments  without any effect. He works in a [employer]ing [employer] and is on his feet all day. He does not have a prior history of wounds however he does have the rash on both lower legs the right arm and the ventral aspect of his left arm. These  are excoriations and clearly have had scratching however there are of macular looking areas on both legs including a substantial larger area on the right leg. This does not have an underlying open area. There is no blistering. The patient tells me that 2 years ago in [place] in response to the rash on his legs he saw a dermatologist who told him he had a condition which may be pyoderma gangrenosum although I may be putting words into his mouth. He seemed to recognize this. On further questioning he admits to a 5 year history of quiesced. ulcerative colitis. He is not in any treatment for this. He's had no recent travel 12/11/16; the patient arrives today with his wound and roughly the same condition we've been using silver alginate this is a deep punched out wound with some surrounding erythema but no tenderness. Biopsy I did did not show confirmed pyoderma gangrenosum suggested nonspecific inflammation and vasculitis but does not provide an actual description of what was seen by the pathologist. I'm really not able to understand this We have also received information from the patient's dermatologist in [place] notes from April 2016. This was a doctor Agarwal-antal. 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 He was prescribed topical steroid high potency under occlusion which helped but at this point the patient did not have a deep punched out wound. 12/18/16; the patient's wound is larger in terms of surface area however this surface looks better and there is less depth. The surrounding erythema also is better. The patient states that the wrap we put on came off 2 days ago when he has been using his compression stockings. He we are in the process of getting a  dermatology consult. 12/26/16 on evaluation today patient's left lower extremity wound shows evidence of infection with surrounding erythema noted. He has been tolerating the dressing changes but states that he has noted more discomfort. There is a larger area of erythema surrounding the wound. No fevers, chills, nausea, or vomiting noted at this time. With that being said the wound still does have slough covering the surface. He is not allergic to any medication that he is aware of at this point. In regard to his right lower extremity he had several regions that are erythematous and pruritic he wonders if there's anything we can do to help that. 01/02/17 I reviewed patient's wound culture which was obtained his visit last week. He was placed on doxycycline at that point. Unfortunately that does not appear to be an antibiotic that would likely help with the situation however the pseudomonas noted on culture is sensitive to Cipro. Also unfortunately patient's wound seems to have a large compared to last week's evaluation. Not severely so but there are definitely increased measurements in general. He is continuing to have discomfort as well he writes this to be a seven out of 10. In fact he would prefer me not to perform any debridement today due to the fact that he is having discomfort and considering he has an active infection on the little reluctant to do so anyway. No fevers, chills, nausea, or vomiting noted at this time. 01/08/17; patient seems dermatology on September 5. I suspect dermatology will want the slides from the biopsy I did sent to their pathologist. I'm not sure if there is a way we can expedite that. In any case the culture I did before I left on vacation 3 weeks ago showed Pseudomonas he was given 10 days of Cipro and per her  description of her intake nurses is actually somewhat better this week although the wound is quite a bit bigger than I remember the last time I saw this. He still  has 3 more days of Cipro 01/21/17; dermatology appointment tomorrow. He has completed the ciprofloxacin for Pseudomonas. Surface of the wound looks better however he is had some deterioration in the lesions on his right leg. Meantime the left lateral leg wound we will continue with sample 01/29/17; patient had his dermatology appointment but I can't yet see that note. He is completed his antibiotics. The wound is more superficial but considerably larger in circumferential area than when he came in. This is in his left lateral calf. He also has swollen erythematous areas with superficial wounds on the right leg and small papular areas on both arms. There apparently areas in her his upper thighs and buttocks I did not look at those. Dermatology biopsied the right leg. Hopefully will have their input next week. 02/05/17; patient went back to see his dermatologist who told him that he had a "scratching problem" as well as staph. He is now on a 30 day course of doxycycline and I believe she gave him triamcinolone cream to the right leg areas to help with the itching [not exactly sure but probably triamcinolone]. She apparently looked at the left lateral leg wound although this was not rebiopsied and I think felt to be ultimately part of the same pathogenesis. He is using sample border foam and changing nevus himself. He now has a new open area on the right posterior leg which was his biopsy site I don't have any of the dermatology notes 02/12/17; we put the patient in compression last week with SANTYL to the wound on the left leg and the biopsy. Edema is much better and the depth of the wound is now at level of skin. Area is still the same Biopsy site on the right lateral leg we've also been using santyl with a border foam dressing and he is changing this himself. 02/19/17; Using silver alginate started last week to both the substantial left leg wound and the biopsy site on the right wound. He is  tolerating compression well. Has a an appointment with his primary M.D. tomorrow wondering about diuretics although I'm wondering if the edema problem is actually lymphedema KYRIE, FLUDD ([id number]) 02/26/17; the patient has been to see his primary doctor Dr. Jerrel Ivory at [facility] our primary care. She started him on Lasix 20 mg and this seems to have helped with the edema. However we are not making substantial change with the left lateral calf wound and inflammation. The biopsy site on the right leg also looks stable but not really all that different. 03/12/17; the patient has been to see vein and vascular Dr. Lucky Cowboy. He has had venous reflux studies I have not reviewed these. I did get a call from his dermatology office. 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 They therefore recommended clobetasol and tetracycline i.e. aggressive treatment for possible pyoderma gangrenosum. 03/26/17; apparently the patient just had reflux studies not an appointment with Dr. dew. She arrives in clinic today having applied clobetasol for 2-3 weeks. He notes over the last 2-3 days excessive  drainage having to change the dressing 3-4 times a day and also expanding erythema. 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 Lillia Carmel at [facility] dermatology locally who kindly saw him at my request. 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 My  concern was the degree of erythema medially extending from his left leg wound which was either inflammation from pyoderma or cellulitis. I put him on Augmentin however culture of the wound showed Pseudomonas which is quinolone sensitive. I really don't believe he has cellulitis however in view of everything I will continue and give him a course of Cipro. He is also on doxycycline as an immune modulator for the pyoderma. In addition to his original wound on the left lateral leg with surrounding erythema he has a wound on the right posterior calf which was an original biopsy site done by dermatology. This was felt to represent pathergy from pyoderma gangrenosum 04/16/17; pyoderma gangrenosum. Saw Dr. Lillia Carmel yesterday. He has been using topical antibiotics to both wound areas his original wound on the left and the biopsies/pathergy area on the right. There is definitely some improvement in the inflammation around the wound on the right although the patient states he has increasing sensitivity of the wounds. He is on prednisone 60 and doxycycline 1 as prescribed by Dr. Lillia Carmel. He is covering the topical antibiotic with gauze and putting this in his own compression stocks and changing this daily. He states that Dr. Lottie Rater did a culture of the left leg wound yesterday 05/07/17; pyoderma gangrenosum. The patient saw Dr. Lillia Carmel yesterday and has a follow-up with her in one month. He is still using topical antibiotics to both wounds although he can't recall exactly what type. He is still on prednisone 60 mg. Dr. Lillia Carmel stated that the doxycycline could stop if we were in agreement. 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 The right medial leg was induced by biopsy/pathergy. The area on the right is essentially healed. 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 The right medial leg has closed and remains closed. He is still on prednisone 60. He tells me he missed his last dermatology appointment with Dr. Lillia Carmel but will make another appointment. He reports that her blood sugar at a recent screen in [place] was high 200's. He was 180 today. 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 I think he is going to require still much longer prednisone perhaps another 3 months before attempting to taper. In the meantime his wound is a lot better. Smaller. He is cleaning this off daily and applying topical antibiotics. When he was last in the clinic I thought about changing to [facility] and actually put in a couple of calls to dermatology although probably not during their business hours. 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 He continues on oral prednisone. He has been using triple antibiotic ointment. There  is surface debris and we will transition to [facility] and have him return in 2 weeks. He has lost 30 pounds since his last appointment with lifestyle modification. He may benefit from topical steroid cream for treatment this can be considered at a later date. 08/22/17 on evaluation today patient appears to actually be doing rather well in regard to his left lateral lower extremity ulcer. He has actually been managed by Dr. Dellia Nims most recently. Patient is currently on oral steroids at this time. This seems to have been of benefit for him. Nonetheless his last visit was actually with Leah on 08/06/17. Currently he is not utilizing any topical steroid creams although this could be of benefit as well. No fevers, chills, nausea, or vomiting noted at this time. 09/05/17 on evaluation today patient appears to be doing better in regard to his left lateral lower  extremity ulcer. He has been tolerating the dressing changes without complication. He is using Santyl with good effect. Overall I'm very pleased with how things are standing at this point. Patient likewise is happy that this is doing better. 09/19/17 on evaluation today patient actually appears to be doing rather well in regard to his left lateral lower extremity ulcer. Again this is secondary to Pyoderma gangrenosum and he seems to be progressing well with the Santyl which is good news. He's not having any significant pain. 10/03/17 on evaluation today patient appears to be doing excellent in regard to his lower extremity wound on the left secondary to Pyoderma gangrenosum. He has been tolerating the Santyl without complication and in general I feel like he's making good progress. 10/17/17 on evaluation today patient appears to be doing very well in regard to his left lateral lower surety ulcer. He has been tolerating the dressing changes without complication. There does not appear to be any evidence of infection he's alternating the Santyl and the triple antibiotic ointment every other day this seems to be doing well for him. 11/03/17 on evaluation today patient appears to be doing very well in regard to his left lateral lower extremity ulcer. He is been tolerating the dressing changes without complication which is good news. Fortunately there does not appear to be any evidence of infection which is also great news. Overall is doing excellent they are starting to taper down on the prednisone is down to 40 mg at this point it also started topical clobetasol for him. 11/17/17 on evaluation today patient appears to be doing well in regard to his left lateral lower surety ulcer. He's been tolerating the dressing changes without complication. He does note that he is having no pain, no excessive drainage or discharge, and overall he feels like things are going about how he would expect and hope they would.  Overall he seems to have no evidence of infection at this time in my opinion which is good news. 12/04/17-He is seen in follow-up evaluation for right lateral lower extremity ulcer. He has been applying topical steroid cream. Today's measurement show slight increase in size. Over the next 2 weeks we will transition to every other day Santyl and steroid cream. He has been encouraged to monitor for changes and notify clinic with any concerns 12/15/17 on evaluation today patient's left lateral motion the ulcer and fortunately is doing worse again at this point. This just since last week to this week has close to doubled in size according to the patient. I did not seeing last week's I do not have a visual to compare this  to in our system was also down so we do not have all the charts and at this point. Nonetheless it does have me somewhat concerned in regard to the fact that again he was worried enough about it he has contact the dermatology that placed them back on the full strength, 50 mg a day of the prednisone that he was taken previous. He continues to alternate using clobetasol along with Santyl at this point. He is obviously somewhat frustrated. NICOLO, TOMKO ([id number]) 12/22/17 on evaluation today patient appears to be doing a little worse compared to last evaluation. Unfortunately the wound is a little deeper and slightly larger than the last week's evaluation. With that being said he has made some progress in regard to the irritation surrounding at this time unfortunately despite that progress that's been made he still has a significant issue going on here. 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 For that reason I discussed with him today the possibility of perform a wound culture to ensure there's nothing overtly infected. 01/06/18 on evaluation  today patient's wound is larger and deeper than previously evaluated. With that being said it did appear that his wound was infected after my last evaluation with him. Subsequently I did end up prescribing a prescription for Bactrim DS which she has been taking and having no complication with. Fortunately there does not appear to be any evidence of infection at this point in time as far as anything spreading, no want to touch, and overall I feel like things are showing signs of improvement. 01/13/18 on evaluation today patient appears to be even a little larger and deeper than last time. There still muscle exposed in the base of the wound. Nonetheless he does appear to be less erythematous I do believe inflammation is calming down also believe the infection looks like it's probably resolved at this time based on what I'm seeing. No fevers, chills, nausea, or vomiting noted at this time. 01/30/18 on evaluation today patient actually appears to visually look better for the most part. Unfortunately those visually this looks better he does seem to potentially have what may be an abscess in the muscle that has been noted in the central portion of the wound. This is the first time that I have noted what appears to be fluctuance in the central portion of the muscle. With that being said I'm somewhat more concerned about the fact that this might indicate an abscess formation at this location. I do believe that an ultrasound would be appropriate. This is likely something we need to try to do as soon as possible. He has been switch to mupirocin ointment and he is no longer using the steroid ointment as prescribed by dermatology he sees them again next week he's been decreased from 60 to 40 mg of prednisone. 03/09/18 on evaluation today patient actually appears to be doing a little better compared to last time I saw him. There's not as much erythema surrounding the wound itself. He I did review his most recent  infectious disease note which was dated 02/24/18. He saw Dr. Michel Bickers in [place]. 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 Patient is now off of antibiotics and they are continually observing this. There seems to be no change in  the past two weeks in my pinion based on what the patient says and what I see today compared to what Dr. Megan Salon likely saw two weeks ago. No fevers, chills, nausea, or vomiting noted at this time. 03/23/18 on evaluation today patient's wound actually appears to be showing signs of improvement which is good news. He is currently still on the Dapsone. He is also working on tapering the prednisone to get off of this and Dr. Lottie Rater is working with him in this regard. Nonetheless overall I feel like the wound is doing well it does appear based on the infectious disease note that I reviewed from Dr. 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 I did also review the results of his ultrasound of left lower extremity which revealed there was a dentist tissue in the base of the wound without an abscess noted. 04/06/18 on evaluation today the patient's left lateral lower extremity ulcer actually appears to be doing fairly well which is excellent news. There does not appear to be any evidence of infection at this time which is also great news. Overall he still does have a significantly large ulceration although little by little he seems to be making progress. He is down to 10 mg a day of the prednisone. 04/20/18 on evaluation today patient actually appears to be doing excellent at this time in regard to his left lower extremity ulcer. He's making signs of good progress unfortunately this is taking much longer than we would really like to see but nonetheless he is making  progress. Fortunately there does not appear to be any evidence of infection at this time. No fevers, chills, nausea, or vomiting noted at this time. The patient has not been using the Santyl due to the cost he hadn't got in this field yet. He's mainly been using the antibiotic ointment topically. Subsequently he also tells me that he really has not been scrubbing in the shower I think this would be helpful again as I told him it doesn't have to be anything too aggressive to even make it believe just enough to keep it free of some of the loose slough and biofilm on the wound surface. 05/11/18 on evaluation today patient's wound appears to be making slow but sure progress in regard to the left lateral lower extremity ulcer. He is been tolerating the dressing changes without complication. Fortunately there does not appear to be any evidence of infection at this time. He is still just using triple antibiotic ointment along with clobetasol occasionally over the area. He never got the Santyl and really does not seem to intend to in my pinion. 06/01/18 on evaluation today patient appears to be doing a little better in regard to his left lateral lower extremity ulcer. He states that overall he does not feel like he is doing as well with the Dapsone as he did with the prednisone. Nonetheless he sees his dermatologist later today and is gonna talk to them about the possibility of going back on the prednisone. Overall again I believe that the wound would be better if you would utilize Santyl but he really does not seem to be interested in going back to the [place] at this point. He has been using triple antibiotic ointment. 06/15/18 on evaluation today patient's wound actually appears to be doing about the same at this point. Fortunately there is no signs of  infection at this time. He has made slight improvements although he continues to not really want to clean the wound bed at this point. He states that he just  doesn't mess with it he doesn't want to cause any problems with everything else he has going on. He has been on medication, antibiotics as prescribed by his dermatologist, for a staff infection of his lower extremities which is really drying out now and looking much better he tells me. Fortunately there is no sign of overall infection. 06/29/18 on evaluation today patient appears to be doing well in regard to his left lateral lower surety ulcer all things considering. Fortunately his staff infection seems to be greatly improved compared to previous. He has no signs of infection and this is drying up quite nicely. He is still the doxycycline for this is no longer on cental, Dapsone, or any of the other medications. His dermatologist has recommended possibility of an infusion but right now he does not want to proceed with that. 07/13/18 on evaluation today patient appears to be doing about the same in regard to his left lateral lower surety ulcer. Fortunately there's no signs of infection at this time which is great news. Unfortunately he still builds up a significant amount of Slough/biofilm of the surface of the wound he still is not really cleaning this as he should be appropriately. Again I'm able to easily with saline and gauze remove the majority of this on the surface which if you would do this at home would likely be a dramatic improvement for him as far as getting the area to improve. Nonetheless overall I still feel like he is making progress is just very slow. I think Santyl will be of benefit for him as well. Still he has not gotten this as of this point. 07/27/18 on evaluation today patient actually appears to be doing little worse in regards of the erythema around the periwound region of the wound he also tells me that he's been having more drainage currently compared to what he was experiencing last time I saw him. He states not quite as bad as what he had because this was infected previously  but nonetheless is still appears to be doing poorly. Fortunately there is no evidence of systemic infection at this point. The patient tells me that he is not going to be able to afford the Santyl. He is still waiting to hear about the infusion therapy with his dermatologist. Apparently she wants an updated colonoscopy first. SHAWN, CARATTINI ([id number]) 08/10/18 on evaluation today patient appears to be doing better in regard to his left lateral lower extremity ulcer. Fortunately he is showing signs of improvement in this regard he's actually been approved for Remicade infusion's as well although this has not been scheduled as of yet. Fortunately there's no signs of active infection at this time in regard to the wound although he is having some issues with infection of the right lower extremity is been seen as dermatologist for this. Fortunately they are definitely still working with him trying to keep things under control. 09/07/18 on evaluation today patient is actually doing rather well in regard to his left lateral lower extremity ulcer. He notes these actually having some hair grow back on his extremity which is something he has not seen in years. He also tells me that the pain is really not giving them any trouble at this time which is also good news overall she is very pleased with the progress he's using  a combination of the mupirocin along with the probate is all mixed. 09/21/18 on evaluation today patient actually appears to be doing fairly well all things considered in regard to his looks from the ulcer. He's been tolerating the dressing changes without complication. Fortunately there's no signs of active infection at this time which is good news he is still on all antibiotics or prevention of the staff infection. He has been on prednisone for time although he states it is gonna contact his dermatologist and see if she put them on a short course due to some irritation that he has going on  currently. Fortunately there's no evidence of any overall worsening this is going very slow I think cental would be something that would be helpful for him although he states that $50 for tube is quite expensive. He therefore is not willing to get that at this point. 10/06/18 on evaluation today patient actually appears to be doing decently well in regard to his left lateral leg ulcer. He's been tolerating the dressing changes without complication. Fortunately there's no signs of active infection at this time. Overall I'm actually rather pleased with the progress he's making although it's slow he doesn't show any signs of infection and he does seem to be making some improvement. I do believe that he may need a switch up and dressings to try to help this to heal more appropriately and quickly. 10/19/18 on evaluation today patient actually appears to be doing better in regard to his left lateral lower extremity ulcer. This is shown signs of having much less Slough buildup at this point due to the fact he has been using the [employer]. Obviously this is very good news. The overall size of the wound is not dramatically smaller but again the appearance is. 11/02/18 on evaluation today patient actually appears to be doing quite well in regard to his lower [place] ulcer. A lot of the skin around the ulcer is actually somewhat irritating at this point this seems to be more due to the dressing causing irritation from the adhesive that anything else. Fortunately there is no signs of active infection at this time. 11/24/18 on evaluation today patient appears to be doing a little worse in regard to his overall appearance of his lower extremity ulcer. There's more erythema and warmth around the wound unfortunately. He is currently on doxycycline which he has been on for some time. With that being said I'm not sure that seems to be helping with what appears to possibly be an acute cellulitis with regard to his left lower  extremity ulcer. No fevers, chills, nausea, or vomiting noted at this time. 12/08/18 on evaluation today patient's wounds actually appears to be doing significantly better compared to his last evaluation. He has been using Santyl along with alternating tripling about appointment as well as the steroid cream seems to be doing quite well and the wound is showing signs of improvement which is excellent news. Fortunately there's no evidence of infection and in fact his culture came back negative with only normal skin flora noted. 12/21/2018 upon evaluation today patient actually appears to be doing excellent with regard to his ulcer. This is actually the best that I have seen it since have been helping to take care of him. It is both smaller as well as less slough noted on the surface of the wound and seems to be showing signs of good improvement with new skin growing from the edges. He has been using just the triamcinolone  he does wonder if he can get a refill of that ointment today. 01/04/2019 upon evaluation today patient actually appears to be doing well with regard to his left lateral lower extremity ulcer. With that being said it does not appear to be that he is doing quite as well as last time as far as progression is concerned. There does not appear to be any signs of infection or significant irritation which is good news. With that being said I do believe that he may benefit from switching to a collagen based dressing based on how clean The wound appears. 01/18/2019 on evaluation today patient actually appears to be doing well with regard to his wound on the left lower extremity. He is not made a lot of progress compared to where we were previous but nonetheless does seem to be doing okay at this time which is good news. There is no signs of active infection which is also good news. My only concern currently is I do wish we can get him into utilizing the collagen dressing his insurance would not pay  for the supplies that we ordered although it appears that he may be able to order this through his supply company that he typically utilizes. This is [employer]. Nonetheless he did try to order it during the office visit today and it appears this did go through. We will see if he can get that it is a different brand but nonetheless he has collagen and I do think will be beneficial. 02/01/2019 on evaluation today patient actually appears to be doing a little worse today in regard to the overall size of his wounds. Fortunately there is no signs of active infection at this time. That is visually. Nonetheless when this is happened before it was due to infection. For that reason were somewhat concerned about that this time as well. 02/08/2019 on evaluation today patient unfortunately appears to be doing slightly worse with regard to his wound upon evaluation today. Is measuring a little deeper and a little larger unfortunately. I am not really sure exactly what is causing this to enlarge he actually did see his dermatologist she is going to see about initiating Humira for him. Subsequently she also did do steroid injections into the wound itself in the periphery. Nonetheless still nonetheless he seems to be getting a little bit larger he is gone back to just using the steroid cream topically which I think is appropriate. I would say hold off on the collagen for the time being is definitely a good thing to do. Based on the culture results which we finally did get the final result back regarding it shows staph as the bacteria noted again that can be a normal skin bacteria based on the fact however he is having increased drainage and worsening of the wound measurement wise I would go ahead and place him on an antibiotic today I do believe for this. 02/15/2019 on evaluation today patient actually appears to be doing somewhat better in regard to his ulcer. There is no signs of worsening at this time I did review his  culture results which showed evidence of Staphylococcus aureus but not MRSA. Again this could just be more related to the normal skin bacteria although he states the drainage has slowed down quite a bit he may have had a mild infection not just colonization. And was much smaller and then since around10/04/2019 on evaluation today patient appears to be doing unfortunately worse as far as the size of the  wound. I really feel like that this is steadily getting larger again it had been doing excellent right at the beginning of September we have seen a steady increase in the area of the wound it is almost 2-1/2 times the size it was on September 1. Obviously this is a bad trend this is not wanting to see. For that reason we went back to using just the topical triamcinolone cream which does seem to help with inflammation. I checked him for bacteria by way of culture and nothing showed positive there. I am considering giving him a short course of a tapering steroid Taeshawn Helfman, [place] ([id number]) today to see if that is can be beneficial for him. The patient is in agreement with giving that a try. 03/08/2019 on evaluation today patient appears to be doing very well in comparison to last evaluation with regard to his lower extremity ulcer. This is showing signs of less inflammation and actually measuring slightly smaller compared to last time every other week over the past month and a half he has been measuring larger larger larger. Nonetheless I do believe that the issue has been inflammation the prednisone does seem to have been beneficial for him which is good news. No fevers, chills, nausea, vomiting, or diarrhea. 03/22/2019 on evaluation today patient appears to be doing about the same with regard to his leg ulcer. He has been tolerating the dressing changes without complication. With that being said the wound seems to be mostly arrested at its current size but really is not making any progress except  for when we prescribed the prednisone. He did show some signs of dropping as far as the overall size of the wound during that interval week. Nonetheless this is something he is not on long-term at this point and unfortunately I think he is getting need either this or else the Humira which his dermatologist has discussed try to get approval for. With that being said he will be seeing his dermatologist on the 11th of this month that is November. 04/19/2019 on evaluation today patient appears to be doing really about the same the wound is measuring slightly larger compared to last time I saw him. He has not been into the office since November 2 due to the fact that he unfortunately had Covid as that his entire family. He tells me that it was rough but they did pull-through and he seems to be doing much better. Fortunately there is no signs of active infection at this time. No fevers, chills, nausea, vomiting, or diarrhea. 05/10/2019 on evaluation today patient unfortunately appears to be doing significantly worse as compared to last time I saw him. He does tell me that he has had his first dose of Humira and actually is scheduled to get the next one in the upcoming week. With that being said he tells me also that in the past several days he has been having a lot of issues with green drainage she showed me a picture this is more blue-green in color. He is also been having issues with increased sloughy buildup and the wound does appear to be larger today. Obviously this is not the direction that we want everything to take based on the starting of his Humira. Nonetheless I think this is definitely a result of likely infection and to be honest I think this is probably Pseudomonas causing the infection based on what I am seeing. 05/24/2019 on evaluation today patient unfortunately appears to be doing significantly worse compared to  his prior evaluation with me 2 weeks ago. I did review his culture results which  showed that he does have Staph aureus as well as Pseudomonas noted on the culture. Nonetheless the Levaquin that I prescribed for him does not appear to have been appropriate and in fact he tells me he is no longer experiencing the green drainage and discharge that he had at the last visit. Fortunately there is no signs of active infection at this time which is good news although the wound has significantly worsened it in fact is much deeper than it was previous. We have been utilizing up to this point triamcinolone ointment as the prescription topical of choice but at this time I really feel like that the wound is getting need to be packed in order to appropriately manage this due to the deeper nature of the wound. Therefore something along the lines of an alginate dressing may be more appropriate. 05/31/2019 upon inspection today patient's wound actually showed signs of doing poorly at this point. Unfortunately he just does not seem to be making any good progress despite what we have tried. He actually did go ahead and pick up the Cipro and start taking that as he was noticing more green drainage he had previously completed the Levaquin that I prescribed for him as well. Nonetheless he missed his appointment for the seventh last week on Wednesday with the wound care center and [facility] where his dermatologist referred him. Obviously I do think a second opinion would be helpful at this point especially in light of the fact that the patient seems to be doing so poorly despite the fact that we have tried everything that I really know how at this point. The only thing that ever seems to have helped him in the past is when he was on high doses of continual steroids that did seem to make a difference for him. Right now he is on immune modulating medication to try to help with the pyoderma but I am not sure that he is getting as much relief at this point as he is previously obtained from the use of  steroids. 06/07/2019 upon evaluation today patient unfortunately appears to be doing worse yet again with regard to his wound. In fact I am starting to question whether or not he may have a fluid pocket in the muscle at this point based on the bulging and the soft appearance to the central portion of the muscle area. There is not anything draining from the muscle itself at this time which is good news but nonetheless the wound is expanding. I am not really seeing any results of the Humira as far as overall wound progression based on what I am seeing at this point. The patient has been referred for second opinion with regard to his wound to the [facility] wound care center by his dermatologist which I definitely am not in opposition to. Unfortunately we tried multiple dressings in the past including collagen, alginate, and at one point even Hydrofera Blue. With that being said he is never really used it for any significant amount of time due to the fact that he often complains of pain associated with these dressings and then will go back to either using the Santyl which she has done intermittently or more frequently the triamcinolone. He is also using his own compression stockings. We have wrapped him in the past but again that was something else that he really was not a big fan of.  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 He seemed to do much better when he was on the steroids although honestly I understand that the steroids are not necessarily the best medication to be on long-term obviously 06/14/2019 on evaluation today patient appears to be doing actually a little bit better with  regard to the overall appearance with his leg. Unfortunately he does continue to have issues with what appears to be some fluid underneath the muscle although he did see the wound specialty center at [facility] last week their main goals were to see about infusion therapy in place of the Humira as they feel like that is not quite strong enough. They also recommended that we continue with the treatment otherwise as we are they felt like that was appropriate and they are okay with him continuing to follow-up here with [place] in that regard. With that being said they are also sending him to the vein specialist there to see about vein stripping and if that would be of benefit for him. Subsequently they also did not really address whether or not an ultrasound of the muscle area to see if there is anything that needs to be addressed here would be appropriate or not. For that reason I discussed this with him last week I think we may proceed down that road at this point. 06/21/2019 upon evaluation today patient's wound actually appears to be doing slightly better compared to previous evaluations. I do believe that he has made a difference with regard to the progression here with the use of oral steroids. Again in the past has been the only thing that is really calm things down. He does tell me that from [name] is gotten a good news from there that there are no further vein stripping that is necessary at this point. I do not have that available for review today although the patient did relay this to me. He also did obtain and have the ultrasound of the wound completed which I did sign off on today. It does appear that there is no fluid collection under the muscle this is likely then just edematous tissue in general. That is also good news. Overall I still believe the inflammation is the main issue here. He did inquire about the possibility of a wound VAC again with the muscle protruding like it is I am not really sure whether  the wound VAC is necessarily ideal or not. That is something we will have to consider although I do believe he may need compression wrapping to try to help with edema control which could potentially be of benefit. 06/28/2019 on evaluation today patient appears to be doing slightly better measurement wise although this is not terribly smaller he least seems to be trending towards that direction. With that being said he still seems to have purulent drainage noted in the wound bed at this time. He has been on Levaquin followed by Cipro over the past month. Unfortunately he still seems to have some issues with active infection at this time. I did perform a culture last week in order to evaluate and see if indeed there was still anything going  on. Subsequently the culture did come back [place] ([id number]) showing Pseudomonas which is consistent with the drainage has been having which is blue-green in color. He also has had an odor that again was somewhat consistent with Pseudomonas as well. Long story short it appears that the culture showed an intermediate finding with regard to how well the Cipro will work for the Pseudomonas infection. Subsequently being that he does not seem to be clearing up and at best what we are doing is just keeping this at [place] I think he may need to see infectious disease to discuss IV antibiotic options. 07/05/2019 upon evaluation today patient appears to be doing okay in regard to his leg ulcer. He has been tolerating the dressing changes at this point without complication. Fortunately there is no signs of active infection at this time which is good news. No fevers, chills, nausea, vomiting, or diarrhea. With that being said he does have an appointment with infectious disease tomorrow and his primary care on Wednesday. Again the reason for the infectious disease referral was due to the fact that he did not seem to be fully resolving with the use of oral antibiotics  and therefore we were thinking that IV antibiotic therapy may be necessary secondary to the fact that there was an intermediate finding for how effective the Cipro may be. Nonetheless again he has been having a lot of purulent and even green drainage. Fortunately right now that seems to have calmed down over the past week with the reinitiation of the oral antibiotic. Nonetheless we will see what Dr. Megan Salon has to say. 07/12/2019 upon evaluation today patient appears to be doing about the same at this point in regard to his left lower extremity ulcer. Fortunately there is no signs of active infection at this time which is good news I do believe the Levaquin has been beneficial I did review Dr. Hale Bogus note and to be honest I agree that the patient's leg does appear to be doing better currently. What we found in the past as he does not seem to really completely resolve he will stop the antibiotic and then subsequently things will revert back to having issues with blue-green drainage, increased pain, and overall worsening in general. Obviously that is the reason I sent him back to infectious disease. 07/19/2019 upon evaluation today patient appears to be doing roughly the same in size there is really no dramatic improvement. He has started back on the Levaquin at this point and though he seems to be doing okay he did still have a lot of blue/green drainage noted on evaluation today unfortunately. I think that this is still indicative more likely of a Pseudomonas infection as previously noted and again he does see Dr. Megan Salon in just a couple of days. I do not know that were really able to effectively clear this with just oral antibiotics alone based on what I am seeing currently. Nonetheless we are still continue to try to manage as best we can with regard to the patient and his wound. I do think the wrap was helpful in decreasing the edema which is excellent news. No fevers, chills, nausea, vomiting,  or diarrhea. 07/26/2019 upon evaluation today patient appears to be doing slightly better with regard to the overall appearance of the muscle there is no dark discoloration centrally. Fortunately there is no signs of active infection at this time. No fevers, chills, nausea, vomiting, or diarrhea. Patient's wound bed currently the patient did have an appointment with  Dr. Megan Salon at infectious disease last week. With that being said Dr. Megan Salon the patient states was still somewhat hesitant about put him on any IV antibiotics he wanted [place] to repeat cultures today and then see where things go going forward. He does look like Dr. Megan Salon because of some improvement the patient did have with the Levaquin wanted [place] to see about repeating cultures. If it indeed grows the Pseudomonas again then he recommended a possibility of considering a PICC line placement and IV antibiotic therapy. He plans to see the patient back in 1 to 2 weeks. 08/02/2019 upon evaluation today patient appears to be doing poorly with regard to his left lower extremity. We did get the results of his culture back it shows that he is still showing evidence of Pseudomonas which is consistent with the purulent/blue-green drainage that he has currently. Subsequently the culture also shows that he now is showing resistance to the oral fluoroquinolones which is unfortunate as that was really the only thing to treat the infection prior. I do believe that he is looking like this is going require IV antibiotic therapy to get this under control. Fortunately there is no signs of systemic infection at this time which is good news. The patient does see Dr. Megan Salon tomorrow. 08/09/2019 upon evaluation today patient appears to be doing better with regard to his left lower extremity ulcer in regard to the overall appearance. He is currently on IV antibiotic therapy. As ordered by Dr. Megan Salon. Currently the patient is on ceftazidime which she is going  to take for the next 2 weeks and then follow-up for 4 to 5-week appointment with Dr. Megan Salon. The patient started this this past Friday symptoms have not for a total of 3 days currently in full. 08/16/2019 upon evaluation today patient's wound actually does show muscle in the base of the wound but in general does appear to be much better as far as the overall evidence of infection is concerned. In fact I feel like this is for the most part cleared up he still on the IV antibiotics he has not completed the full course yet but I think he is doing much better which is excellent news. 08/23/2019 upon evaluation today patient appears to be doing about the same with regard to his wound at this point. He tells me that he still has pain unfortunately. Fortunately there is no evidence of systemic infection at this time which is great news. There is significant muscle protrusion. 09/13/19 upon evaluation today patient appears to be doing about the same in regard to his leg unfortunately. He still has a lot of drainage coming from the ulceration there is still muscle exposed. With that being said the patient's last wound culture still showed an intermediate finding with regard to the Pseudomonas he still having the bluish/green drainage as well. Overall I do not know that the wound has completely cleared of infection at this point. Fortunately there is no signs of active infection systemically at this point which is good news. 09/20/2019 upon evaluation today patient's wound actually appears to be doing about the same based on what I am seeing currently. I do not see any signs of systemic infection he still does have evidence of some local infection and drainage. He did see Dr. Megan Salon last week and Dr. Megan Salon states that he probably does need a different IV antibiotic although he does not want to put him on this until the patient begins the Remicade infusion which is actually  scheduled for about 10 days out from  today on 13 May. Following that time Dr. Megan Salon is good to see him back and then will evaluate the feasibility of starting him on the IV antibiotic therapy once again at that point. I do not disagree with this plan I do believe as Dr. Megan Salon stated in his note that I reviewed today that the patient's issue is multifactorial with the pyoderma being 1 aspect of this that were hoping the Remicade will be helpful for her. In the meantime I think the gentamicin is, helping to keep things under decent okay control in regard to the ulcer. 09/27/2019 upon evaluation today patient appears to be doing about the same with regard to his wound still there is a lot of muscle exposure though he does have some hyper granulation tissue noted around the edge and actually some granulation tissue starting to form over the muscle which is actually good news. Fortunately there is no evidence of active infection which is also good news. His pain is less at this point. 5/21; this is a patient I have not seen in a long time. 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 He has a large wound on the left lateral leg with protruding muscle. He comes in the clinic today showing the same area on his left medial ankle. He says there is been a spot there for some time although we have not previously defined this. Today he has a clearly defined area with slight amount of skin breakdown surrounded by raised areas with a purplish hue in color. This is not painful he says it is irritated. 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 He still has muscle protruding from the lateral portion of his left leg but fortunately the new area that they were concerned about at his last visit does not appear to have opened at this point. He is currently on Remicade infusions and seems to be doing better in  my opinion in fact the wound itself seems to be overall much better. The purplish discoloration that he did have seems to have resolved and I think that is a good sign that hopefully the Remicade is doing its job. He does Gatchalian, A[place] ([id number]) have some biofilm noted over the surface of the wound. 11/01/2019 on evaluation today patient's wound actually appears to be doing excellent at this time. Fortunately there is no evidence of active infection and overall I feel like he is making great progress. The Remicade seems to be due excellent job in my opinion. 11/08/19 evaluation today vision actually appears to be doing quite well with regard to his weight ulcer. He's been tolerating dressing changes without complication. Fortunately there is no evidence of infection. No fevers, chills, nausea, or vomiting noted at this time. Overall states that is having more itching than pain which is actually a good sign in my opinion. 12/13/2019 upon evaluation today patient appears to be doing well today with regard to his wound. He has been tolerating the dressing changes without complication. Fortunately there is no sign of active infection at this time. No fevers, chills, nausea, vomiting, or diarrhea. Overall I feel like the infusion therapy has been very beneficial for him. 01/06/2020 on evaluation today patient appears to be doing well with regard to his wound. This is measuring smaller and actually looks to be doing better. Fortunately there is no signs  of active infection at this point. No fevers, chills, nausea, vomiting, or diarrhea. With that being said he does still have the blue-green drainage but this does not seem to be causing any significant issues currently. He has been using the gentamicin that does seem to be keeping things under decent control at this point. He goes later this morning for his next infusion therapy for the pyoderma which seems to also be very beneficial. 02/07/2020 on evaluation  today patient appears to be doing about the same in regard to his wounds currently. Fortunately there is no signs of active infection systemically he does still have evidence of local infection still using gentamicin. He also is showing some signs of improvement albeit slowly I do feel like we are making some progress here. 02/21/2020 upon evaluation today patient appears to be making some signs of improvement the wound is measuring a little bit smaller which is great news and overall I am very pleased with where he stands currently. He is going to be having infusion therapy treatment on the 15th of this month. Fortunately there is no signs of active infection at this time. 03/13/2020 I do believe patient's wound is actually showing some signs of improvement here which is great news. He has continue with the infusion therapy through rheumatology/dermatology at [facility]. That does seem to be beneficial. I still think he gets as much benefit from this as he did from the prednisone initially but nonetheless obviously this is less harsh on his body that the prednisone as far as they are concerned. 03/31/2020 on evaluation today patient's wound actually showing signs of some pretty good improvement in regard to the overall appearance of the wound bed. There is still muscle exposed though he does have some epithelial growth around the edges of the wound. Fortunately there is no signs of active infection at this time. No fevers, chills, nausea, vomiting, or diarrhea. 04/24/2020 upon evaluation today patient appears to be doing about the same in regard to his leg ulcer. He has been tolerating the dressing changes without complication. Fortunately there is no signs of active infection at this time. No fevers, chills, nausea, vomiting, or diarrhea. With that being said he still has a lot of irritation from the bandaging around the edges of the wound. We did discuss today the possibility of a referral to plastic  surgery. 05/22/2020 on evaluation today patient appears to be doing well with regard to his wounds all things considered. He has not been able to get the Chantix apparently there is a recall nurse that I was unaware of put out by [employer] involuntarily. Nonetheless for now I am and I have to do some research into what may be the best option for him to help with quitting in regard to smoking and we discussed that today. 06/26/2020 upon evaluation today patient appears to be doing well with regard to his wound from the standpoint of infection I do not see any signs of infection at this point. With that being said unfortunately he is still continuing to have issues with muscle exposure and again he is not having a whole lot of new skin growth unfortunately. There does not appear to be any signs of active infection at this time. No fevers, chills, nausea, vomiting, or diarrhea. 07/10/2020 upon evaluation today patient appears to be doing a little bit more poorly currently compared to where he was previous. I am concerned currently about an active infection that may be getting worse especially in light  of the increased size and tenderness of the wound bed. No fevers, chills, nausea, vomiting, or diarrhea. 07/24/2020 upon evaluation today patient appears to be doing poorly in regard to his leg ulcer. He has been tolerating the dressing changes without complication but unfortunately is having a lot of discomfort. 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 Subsequently I think he is going require possibly IV antibiotics to get this under control. I am very concerned about the severity of his infection and the amount of discomfort he is having. 07/31/2020 upon evaluation today patient appears to be doing about the same in regard to his leg wound. He did see Dr. Megan Salon and Dr. Megan Salon is actually going to start him on IV antibiotics. He goes for  the PICC line tomorrow. With that being said there do not have that run for 2 weeks and then see how things are doing and depending on how he is progressing they may extend that a little longer. Nonetheless I am glad this is getting ready to be in place and definitely feel it may help the patient. In the meantime is been using mainly triamcinolone to the wound bed has an anti-inflammatory. 08/07/2020 on evaluation today patient appears to be doing well with regard to his wound compared even last week. In the interim he has gotten the PICC line placed and overall this seems to be doing excellent. There does not appear to be any evidence of infection which is great news systemically although locally of course has had the infection this appears to be improving with the use of the antibiotics. 08/14/2020 upon evaluation today patient's wound actually showing signs of excellent improvement. Overall the irritation has significantly improved the drainage is back down to more of a normal level and his pain is really pretty much nonexistent compared to what it was. Obviously I think that this is significantly improved secondary to the IV antibiotic therapy which has made all the difference in the world. Again he had a resistant form of Pseudomonas for which oral antibiotics just was not cutting it. Nonetheless I do think that still we need to consider the possibility of a surgical closure for this wound is been open so long and to be honest with muscle exposed I think this can be very hard to get this to close outside of this although definitely were still working to try to do what we can in that regard. 08/21/2020 upon evaluation today patient appears to be doing very well with regard to his wounds on the left lateral lower extremity/calf area. Fortunately there does not appear to be signs of active infection which is great news and overall very pleased with where things stand today. He is actually wrapping up  his treatment with IV antibiotics tomorrow. After that we will see where things go from there. 08/28/2020 upon evaluation today patient appears to be doing decently well with regard to his leg ulcer. There does not appear to be any signs of Nading, Jaxon ([id number]) active infection which is great news and overall very pleased with where things stand today. No fevers, chills, nausea, vomiting, or diarrhea. 09/18/2020 upon evaluation today patient appears to be doing well with regard to his infection which I feel like is better. Unfortunately he is not doing as well with regard to the overall size of the wound which is not nearly as good at this point. I feel like that he may be having an issue  here with the pyoderma being somewhat out of control. I think that he may benefit from potentially going back and talking to the dermatologist about what to do from the pyoderma standpoint. I am not certain if the infusions are helping nearly as much is what the prednisone did in the past. 10/02/2020 upon evaluation today patient appears to be doing well with regard to his leg ulcer. He did go to the [facility] nurse. Unfortunately they feel like there is a 10% chance that most that he would be able to heal and that the skin graft would take. Obviously this has led him to not be able to go down that path as far as treatment is concerned. Nonetheless he does seem to be doing a little bit better with the prednisone that I gave him last time. I think that he may need to discuss with dermatology the possibility of long-term prednisone as that seems to be what is most helpful for him to be perfectly honest. I am not sure the Remicade is really doing the job. 10/17/2020 upon evaluation today patient appears to be doing a little better in regard to his wound. In fact the case has been since we did the prednisone on May 2 for him that we have noticed a little bit of improvement each time we have seen a size wise as well as  appearance wise as well as pain wise. I think the prednisone has had a greater effect then the infusion therapy has to be perfectly honest. With that being said the patient also feels significantly better compared to what he was previous. All of this is good news but nonetheless I am still concerned about the fact that again we are really not set up to long-term manage him as far as prednisone is concerned. Obviously there are things that you need to be watched I completely understand the risk of prednisone usage as well. 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 With all that being said I do believe that we can give him another round of the prednisone which she is requesting today because of the improvement that he seen since we did that first round. 10/30/2020 upon evaluation today patient's wound actually is showing signs of doing quite well. There does not appear to be any evidence of infection which is great news and overall very pleased with where things stand today. No fevers, chills, nausea, vomiting, or diarrhea. He tells me that the prednisone still has seem to have helped he wonders if we can extend that for just a little bit longer. He did not have the appointment with a dermatologist although he did have an infusion appointment last Friday. That was at [facility]. With that being said he tells me he could not do both that as well as the appointment with the physician on the same day therefore that is can have to be rescheduled. I really want to see if there is anything they feel like that could be done differently to try to help this out as I am not really certain that the infusions are helping significantly here. 11/13/2020 upon evaluation today patient unfortunately appears to be doing somewhat poorly in regard to his wound I feel like this is actually worsening from the standpoint of the pyoderma spreading. I still feel like that he may need  something different as far as trying to manage this going forward. Again we did the prednisone unfortunately his  blood sugars are not doing so well because of this. Nonetheless I believe that the patient likely needs to try topical steroid. We have done triamcinolone for a while I think going with something stronger such as clobetasol could be beneficial again this is not something I do lightly I discussed this with the patient that again this does not normally put underneath an occlusive dressing. Nonetheless I think a thin film as such could help with some of the stronger anti-inflammatory effects. We discussed this today. He would like to try to give this a trial for the next couple weeks. I definitely think that is something that we can do. Evaluate7/03/2021 and today patient's wound bed actually showed signs of doing really about the same. There was a little expansion of the size of the wound and that leading edge that we done looking out although the clobetasol does seem to have slowed this down a bit in my opinion. There is just 1 small area that still seems to be progressing based on what I see. Nonetheless I am concerned about the fact this does not seem to be improving if anything seems to be doing a little bit worse. I do not know that the infusions are really helping him much as next infusion is August 5 his appointment with dermatology is July 25. Either way I really think that we need to have a conversation potentially about this and I am actually going to see if I can talk with Dr. Lillia Carmel in order to see where things stand as well. 12/11/2020 upon evaluation today patient appears to be doing worse in regard to his leg ulcer. Unfortunately I just do not think this is making the progress that I would like to see at this point. Honestly he does have an appointment with dermatology and this is in 2 days. I am wondering what they may have to offer to help with this. Right now what I am  seeing is that he is continuing to show signs of worsening little by little. Obviously that is not great at all. Is the exact opposite of what we are looking for. 12/18/2020 upon evaluation today patient appears to be doing a little better in regard to his wound. The dermatologist actually did do some steroid injections into the wound which does seem to have been beneficial in my opinion. That was on the 25th already this looks a little better to me than last time I saw him. With that being said we did do a culture and this did show that he has Staph aureus noted in abundance in the wound. With that being said I do think that getting him on an oral antibiotic would be appropriate as well. Also think we can compression wrap and this will make a difference as well. 12/28/2020 upon evaluation today patient's wound is actually showing signs of doing much better. I do believe the compression wrap is helping he has a lot of drainage but to be honest I think that the compression is helping to some degree in this regard as well as not draining through which is also good news. No fevers, chills, nausea, vomiting, or diarrhea. 01/04/2021 upon evaluation today patient appears to be doing well with regard to his wound. Overall things seem to be doing quite well. He did have a little bit of reaction to the CarboFlex Sorbact he will be using that any longer. With that being said he is controlled as far as the drainage is concerned overall  and seems to be doing quite well. I do not see any signs of active infection at this time which is great news. No fevers, chills, nausea, vomiting, or diarrhea. 01/11/2021 upon evaluation today patient appears to be doing well with regard to his wounds. He has been tolerating the dressing changes without complication. Fortunately there does not appear to be any signs of active infection at this time which is great news. Overall I am extremely pleased with where we stand currently. No  fevers, chills, nausea, vomiting, or diarrhea. Where using clobetasol in the wound bed he has a lot of new skin growth which is awesome as well. 01/18/2021 upon evaluation today patient appears to be doing very well in regard to his leg ulcer. He has been tolerating the dressing changes without complication. Fortunately there does not appear to be any signs of active infection which is great news. In general I think that he is making excellent progress 01/25/2021 upon evaluation today patient appears to be doing well with regard to his wound on the leg. I am actually extremely pleased with where things stand today. There does not appear to be any signs of active infection which is great news and overall I think that we are definitely headed in the appropriate direction based on what I am seeing currently. There does not appear to be any signs of active infection also excellent news. 02/06/2021 upon evaluation today patient appears to be doing well with regard to his wound. Overall visually this is showing signs of significant Hutcherson, Jackey ([id number]) improvement which is great news. I do not see any signs of active infection systemically which is great even locally I do not think that we are seeing any major complications here. We did do fluorescence imaging with the [employer] today. The patient does have some odor and drainage noted and again this is something that I think would benefit him to probably come more frequently for nurse visits. 02/19/2021 upon evaluation today patient actually appears to be doing quite well in regard to his wound. He has been tolerating the dressing changes without complication and overall I think that this is making excellent progress. I do not see any evidence of active infection at this point which is great news as well. No fevers, chills, nausea, vomiting, or diarrhea. 10/10; wound is made nice progress healthy granulation with a nice rim of epithelialization which  seems to be expanding even from last week he has a deeper area in the inferior part of the more distal part of the wound with not quite as healthy as surface. This area will need to be followed. Using clobetasol and Hydrofera Blue 03/05/2021 upon evaluation today patient appears to be doing very well in regard to his leg ulcer. He has been tolerating dressing changes without complication. Fortunately there does not appear to be any signs of active infection which is great news and overall I am extremely pleased with where we stand currently. 03/12/2021 upon evaluation today patient appears to be doing well with regard to his wound in fact this is extremely extremely good based on what we are seeing today there does not appear to be any signs of active infection and overall I think that he is doing awesome from the standpoint of healing in general. I am extremely pleased with how things seem to be progressing with regard to this pyoderma. Clobetasol has done wonders for him. I think the compression wrapping has also been of great benefit. 03/19/2021 upon  evaluation today patient appears to be doing well with regard to his wound. He is tolerating the dressing changes without complication. In fact I feel like that he is actually making excellent progress at this point based on what I am seeing. No fevers, chills, nausea, vomiting, or diarrhea. 03/26/2021 upon evaluation today patient appears to be doing well with regard to his wound. This again is measuring smaller and looking better. Again the progress is slow but nonetheless continual with what we have been seeing. I do believe that the current plan is doing awesome for him. 04/09/2021 upon evaluation today patient appears to be doing well with regard to his leg ulcer. This is showing signs of excellent improvement the muscle is completely closed over and there does not appear to be any evidence of inflammation at this point his drainage is  significantly improved. Overall I think that he would be a good candidate for looking into a skin substitute at this point as well. We will get a look into some approvals in that regard. Potentially TheraSkin as well as Apligraf could both be considered just depending on insurance coverage. 04/16/2021 upon evaluation today patient appears to be doing well at this point. He has been approved for the Apligraf which we could definitely order although I would like to try to get the TheraSkin approved if at all possible. I did fax notes into them today and I Georgina Peer try to give a call as well. Overall the wound appears to be doing decently well today. 04/20/2021 upon evaluation today patient actually appears to be doing quite well in regard to his wound with that being said we are trying to see what we can do about speeding up the healing process. For that reason we did discuss the possibility of a skin substitute. We got the Apligraf approved. For that reason we will get a go ahead and see what we can do with the Apligraf at this point. I am still trying to get the TheraSkin approved but I have not heard anything from the insurance company yet I have called and talked to them earlier in the week and to be honest this was about half an hour that I spent on the phone they told me I would hear something within 10-15 business days. 04/27/2021 upon evaluation today patient appears to be doing excellent in regard to his wound. I do believe the Apligraf has been beneficial. With that being said he has had a little bit of increased pain has been a little bit concerned. I do want to go ahead and apply his steroid cream today the clobetasol and then put the Apligraf over I just do not think I want to risk not putting the clobetasol on based on what is been going on here currently. Patient voiced understanding. 05/04/2021 upon evaluation today patient is making excellent improvement. Overall there is definitely decrease  in the size of the wound today and I am very pleased in that regard. I do not see any signs of active infection locally nor systemically at this point. No fevers, chills, nausea, vomiting, or diarrhea. 05/11/2021 upon evaluation today patient appears to be doing well with regard to his wound. He has been tolerating Apligraf's without complication and is making excellent progress this is application #4 today. 12/30; patient here for Apligraf application. Appears to be doing well small wound there has been a lot of healing 05/28/2021 upon evaluation today patient appears to be doing well with regard to his wound.  Has been tolerating the dressing changes without complication. Fortunately I do not see any signs of active infection locally nor systemically at this point which is great news. He is done with the Apligraf and the first round and overall this has filled in quite nicely I am not even certain he needs any additional at the moment. I would actually try to go back to clobetasol with [facility] which previously was doing well for him. 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 He is using clobetasol and Hydrofera Blue. Surface area of the wound is smaller and the surface looks very healthy. 06/11/2021 upon evaluation today patient actually appears to be showing signs of good improvement which is great news. Fortunately I do not see any signs of active infection at this time which is also excellent news. I do believe that the patient is doing well with the clobetasol and Hydrofera Blue. He does have some irritation and itching around the rest of the leg will use some triamcinolone over this area. 06/18/2021 upon evaluation today patient appears to be doing well with regard to his wound. He is showing signs of excellent improvement and overall I am extremely pleased with where we stand today. We are moving in the right direction. 06/25/2021 upon  evaluation today patient appears to be doing well with regard to his wound. In fact this is doing also not showing signs of excellent improvement overall very pleased with where we stand today. I do not see any evidence of active infection locally or systemically at this time which is also great news. 07/02/2021 upon evaluation patient's wound bed actually showed signs of good granulation and epithelization at this point. And this is measuring significantly smaller and overall I am extremely pleased with where we stand today. No fevers, chills, nausea, vomiting, or diarrhea. 07/09/2021 upon evaluation patient's wound bed actually showed signs of good granulation and epithelization at this point. Fortunately there does not appear to be any evidence of active infection locally or systemically which is great news and overall I am extremely pleased with where we MAKALE, PINDELL ([id number]) stand at this point. 07/16/2021 upon evaluation today patient appears to be doing well with regard to his wound all things considered although it is maybe a little bit larger than what its been. This does have me a little concerned. Actually question about whether anything is changed and initially he told me know. In fact it was not until the end of the visit that he actually did mention that he had missed his infusion in January. This very well may be what is because the difference is also seeing currently. That definitely has seem to been helping more recently. 07/23/2021 upon evaluation today patient's wound is yet again larger despite the switch to a collagen which was not beneficial. Subsequently I am going to at this point check on a couple things. First and foremost I do want to go ahead and do a culture. I think that there is a chance he could have a low-lying infection that may be part of the issue here. Subsequently I am also probably can go ahead and place him on Bactrim DS which I think is a good option and has  done well with them in the past. Again if this is an infection that should hopefully help to turn things around. With all that being said I do believe that the patient should hopefully be able to turn this around with  reinitiation of the infusion therapy which will be going on this week and subsequently getting on the antibiotic. 3/13; patient presents for follow-up. He has no issues or complaints today. He states he is currently on oral antibiotics for previous culture done in the office. 08/06/2021 upon evaluation today patient appears to be doing well with regard to his wound. I am actually seeing signs of improvement which is great news. I think that he is on a better track he did receive his infusion as well which is great news and overall I think that this should hopefully get him back on track as far as healing is concerned. He is not having any pain and I do not see any signs of inflammation which is great news. 08/13/2021 upon evaluation today patient appears to be doing excellent in regard to his wound on the leg. He has been tolerating the dressing changes without complication. Fortunately I do not see any signs of infection I do believe he is making good progress here and I think her back on track overall the tissue is greatly improved compared to what we have been. 08/20/2021 upon evaluation today patient appears to be doing excellent in regard to his wound. He has been tolerating the dressing changes without complication. Fortunately I do not see any evidence of infection at this time locally or systemically which is great news and overall very pleased with where we stand. 08-31-2021 upon evaluation today patient appears to be doing excellent in regard to his wound. Overall even with the vacation and extra walking he seems to have really done well. Fortunately I do not see any evidence of active infection locally or systemically which is great news and overall I think we are headed in the  right direction. 09-10-2021 upon evaluation today patient's wound is showing signs of improvement. This is a small improvement but nonetheless we seem to be making progress here. I am very pleased in that regard. There does not appear to be any signs of infection and overall things are doing quite nicely. Upon inspection patient's wound bed actually showed signs of good granulation and epithelization at this point. There was some irritation around the edges of the wound where the good skin was it almost appears that something was rubbing or else is was due from drainage and irritation. Nonetheless I am going to see about putting a little bit of zinc just around the wound opening in order to protect the skin from being damaged. The patient is in agreement with that plan. 5/8; its been a while since I have seen this wound and it looks considerably better although still with some depth. Per our intake nurse the dimensions have improved patient has been using a thin layer of clobetasol, Hydrofera Blue under 4-layer compression. He appears to be making progress 10-01-2021 upon evaluation today patient appears to be doing well with regard to his wound although it is somewhat stalled I feel like were basically at a standstill here. He has previously had Apligraf which did very well for him to be honest. I do believe that he may benefit from a repeat of the Apligraf. In fact it got so small at that point but I do not think we even used all of the applications that were recommended as this had improved to such a degree. Nonetheless I do believe that the patient currently could benefit from repeat treatment with the Apligraf due to the improvement this that we did see. He is not opposed  to this and subsequently working to look into getting approval to reinstitute that treatment. 10-08-2021 upon evaluation today patient appears to be doing well with regard to his wound. Overall I feel like he is doing a little  bit better from the standpoint of irritation at this time which is great news. I do not see any signs of active infection locally or systemically which is great news as well. No fevers, chills, nausea, vomiting, or diarrhea. 10-12-2021 upon evaluation today patient appears to be doing well with regard to his wound stable although we are can initiate treatment with Apligraf today. I am hoping this will stimulate things to improve. Patient is in agreement with that plan. 10-19-2028 upon evaluation today patient appears to be doing well with regard to his wound. He has been tolerating the dressing changes without complication. Fortunately there does not appear to be any evidence of active infection locally or systemically at this time which is great news. No fevers, chills, nausea, vomiting, or diarrhea. I do believe the Apligraf has improved the overall appearance of the wound bed receiving some definite improvements here. Objective Constitutional Obese and well-hydrated in no acute distress. ARMOUR, VILLANUEVA ([id number]) Vitals Time Taken: 8:16 AM, Height: 71 in, Weight: 338 lbs, BMI: 47.1, Temperature: 98.2 F, Pulse: 76 bpm, Respiratory Rate: 18 breaths/min, Blood Pressure: 117/76 mmHg. Respiratory normal breathing without difficulty. Psychiatric this patient is able to make decisions and demonstrates good insight into disease process. Alert and Oriented x 3. pleasant and cooperative. General Notes: Upon inspection patient's wound again showed good granulation and even some good epithelization at this point. Actually I am very pleased with the appearance I think we are on the right track Integumentary (Hair, Skin) Wound #1 status is Open. Original cause of wound was Gradually Appeared. The date acquired was: 11/18/2015. The wound has been in treatment 254 weeks. The wound is located on the Left,Lateral Lower Leg. The wound measures 3.4cm length x 2.3cm width x 0.4cm depth; 6.142cm^2 area and  2.457cm^3 volume. There is Fat Layer (Subcutaneous Tissue) exposed. There is no tunneling or undermining noted. There is a medium amount of serosanguineous drainage noted. There is large (67-100%) red granulation within the wound bed. There is a small (1-33%) amount of necrotic tissue within the wound bed including Adherent Slough. 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 A skin graft procedure using a bioengineered skin substitute/cellular or tissue based product was performed by Tommie Sams., PA-C with the following instrument(s): Forceps. Apligraf was applied and secured with Steri-Strips. 22 sq cm of product was utilized and 22 sq cm was wasted. Post Application, Zetuvit was applied. A Time Out was conducted at 08:40, prior to the start of the procedure. The procedure was tolerated well with a pain level of 0 throughout and a pain level of 0 following the procedure. Post procedure Diagnosis Wound #1: Same as Pre-Procedure . Plan 1. I would recommend that we going continue with the wound care measures as before the patient is in agreement with plan this includes the use of the Apligraf this is application #2 and overall I think the patient is doing excellent with this. 2. I am also going to recommend that we have the patient continue to  monitor for any signs of worsening infection. Office if anything changes he should let me know this will be increased pain or otherwise. We will see patient back for reevaluation in 1 week here in the clinic. If anything worsens or changes patient will contact our office for  additional recommendations. Electronic Signature(s) Signed: 10/19/2021 9:04:18 AM By: Worthy Keeler PA-C Entered By: Worthy Keeler on 10/19/2021 09:04:17 SAMNANG, SHUGARS ([id number]) -------------------------------------------------------------------------------- SuperBill Details Patient Name: [name] Date of Service: 10/19/2021 Medical Record Number: [id number] Patient Account Number: [id number] Date of Birth/Sex: January 14, 1981 (40 y.o. M) Treating RN: [name] Primary Care Provider: [name] Other Clinician: Massie Kluver Referring Provider: [name] Treating Provider/Extender: [name] Cliche in Treatment: 254 Diagnosis Coding ICD-10 Codes Code Description I87.2 Venous insufficiency (chronic) (peripheral) L97.222 Non-pressure chronic ulcer of left calf with fat layer exposed E11.622 Type 2 diabetes mellitus with other skin ulcer L88 Pyoderma gangrenosum F17.208 Nicotine dependence, unspecified, with other nicotine-induced disorders Facility Procedures CPT4 Code: 61848592 Description: [phone] (Facility Use Only) Apligraf 44 SQ CM Modifier: Quantity: [place] Code: [id number] Description: 44619 - SKIN SUB GRAFT TRNK/ARM/LEG Modifier: Quantity: 1 CPT4 Code: Description: ICD-10 Diagnosis Description L97.222 Non-pressure chronic ulcer of left calf with fat layer exposed Modifier: Quantity: Physician Procedures CPT4 Code: 0122241 Description: 14643 - WC PHYS SKIN SUB GRAFT TRNK/ARM/LEG Modifier: Quantity: 1 CPT4 Code: Description: ICD-10 Diagnosis Description L97.222 Non-pressure chronic ulcer of left calf with fat layer exposed Modifier: Quantity: Electronic Signature(s) Signed: 10/19/2021 12:57:21 PM By: Massie Kluver Signed: 10/19/2021 4:36:57 PM By: Worthy Keeler PA-C Previous Signature: 10/19/2021 9:04:33 AM Version By: Worthy Keeler PA-C Entered By: Massie Kluver on 10/19/2021 09:53:44

## 2021-10-20 ENCOUNTER — Other Ambulatory Visit: Payer: Self-pay | Admitting: Primary Care

## 2021-10-20 DIAGNOSIS — Z794 Long term (current) use of insulin: Secondary | ICD-10-CM

## 2021-10-20 DIAGNOSIS — E1165 Type 2 diabetes mellitus with hyperglycemia: Secondary | ICD-10-CM

## 2021-10-22 DIAGNOSIS — I872 Venous insufficiency (chronic) (peripheral): Secondary | ICD-10-CM | POA: Diagnosis not present

## 2021-10-22 NOTE — Progress Notes (Signed)
JEVANTE, HOLLIBAUGH ([id number]) Visit Report for 10/22/2021 Arrival Information Details Patient Name: [name], [name] Date of Service: 10/22/2021 8:00 AM Medical Record Number: [id number] Patient Account Number: [id number] Date of Birth/Sex: Jun 05, 1980 (41 y.o. M) Treating RN: Levora Dredge Primary Care Albeiro Trompeter: [name] Other Clinician: Referring Vesper Trant: [name] Treating Fronie [name]/Extender: [name] Cliche in Treatment: 254 Visit Information History Since Last Visit Added or deleted any medications: No Patient Arrived: Ambulatory Any new allergies or adverse reactions: No Arrival Time: 08:00 Had a fall or experienced change in No Accompanied By: self activities of daily living that may affect Transfer Assistance: None risk of falls: Patient Identification Verified: Yes Hospitalized since last visit: No Secondary Verification Process Completed: Yes Has Dressing in Place as Prescribed: Yes Patient Requires Transmission-Based No Has Compression in Place as Prescribed: Yes Precautions: Pain Present Now: No Patient Has Alerts: Yes Patient Alerts: Patient has reaction to silver dressings. Electronic Signature(s) Signed: 10/22/2021 4:41:59 PM By: Levora Dredge Entered By: Levora Dredge on 10/22/2021 08:02:26 [name] ([id number]) -------------------------------------------------------------------------------- Clinic Level of Care Assessment Details Patient Name: [name] Date of Service: 10/22/2021 8:00 AM Medical Record Number: [id number] Patient Account Number: [id number] Date of Birth/Sex: March 27, 1981 (41 y.o. M) Treating RN: Levora Dredge Primary Care Timmy Cleverly: [name] Other Clinician: Referring Lindy Pennisi: [name] Treating Suzanne Kho/Extender: [name] Cliche in Treatment: 254 Clinic Level of Care Assessment Items TOOL 1 Quantity Score []  - Use when EandM and Procedure is performed on INITIAL visit 0 ASSESSMENTS - Nursing Assessment /  Reassessment []  - General Physical Exam (combine w/ comprehensive assessment (listed just below) when performed on new 0 pt. evals) []  - 0 Comprehensive Assessment (HX, ROS, Risk Assessments, Wounds Hx, etc.) ASSESSMENTS - Wound and Skin Assessment / Reassessment []  - Dermatologic / Skin Assessment (not related to wound area) 0 ASSESSMENTS - Ostomy and/or Continence Assessment and Care []  - Incontinence Assessment and Management 0 []  - 0 Ostomy Care Assessment and Management (repouching, etc.) PROCESS - Coordination of Care []  - Simple Patient / Family Education for ongoing care 0 []  - 0 Complex (extensive) Patient / Family Education for ongoing care []  - 0 Staff obtains Programmer, systems, Records, Test Results / Process Orders []  - 0 Staff telephones HHA, Nursing Homes / Clarify orders / etc []  - 0 Routine Transfer to another Facility (non-emergent condition) []  - 0 Routine Hospital Admission (non-emergent condition) []  - 0 New Admissions / Biomedical engineer / Ordering NPWT, Apligraf, etc. []  - 0 Emergency Hospital Admission (emergent condition) PROCESS - Special Needs []  - Pediatric / Minor Patient Management 0 []  - 0 Isolation Patient Management []  - 0 Hearing / Language / Visual special needs []  - 0 Assessment of Community assistance (transportation, D/C planning, etc.) []  - 0 Additional assistance / Altered mentation []  - 0 Support Surface(s) Assessment (bed, cushion, seat, etc.) INTERVENTIONS - Miscellaneous []  - External ear exam 0 []  - 0 Patient Transfer (multiple staff / Civil Service fast streamer / Similar devices) []  - 0 Simple Staple / Suture removal (25 or less) []  - 0 Complex Staple / Suture removal (26 or more) []  - 0 Hypo/Hyperglycemic Management (do not check if billed separately) []  - 0 Ankle / Brachial Index (ABI) - do not check if billed separately Has the patient been seen at the hospital within the last three years: Yes Total Score: 0 Level Of Care:  ____ [name] ([id number]) Electronic Signature(s) Signed: 10/22/2021 4:41:59 PM By: Levora Dredge Entered By: Levora Dredge on 10/22/2021 08:24:58 Reifschneider, [name] ([id number]) --------------------------------------------------------------------------------  Compression Therapy Details Patient Name: JAYMES, HANG Date of Service: 10/22/2021 8:00 AM Medical Record Number: [id number] Patient Account Number: [id number] Date of Birth/Sex: September 14, 1980 (41 y.o. M) Treating RN: Levora Dredge Primary Care Brinson Tozzi: [name] Other Clinician: Referring Yasuko Lapage: [name] Treating Azzan Butler/Extender: [name] Cliche in Treatment: 254 Compression Therapy Performed for Wound Assessment: Wound #1 Left,Lateral Lower Leg Performed By: Clinician Levora Dredge, RN Compression Type: Four Layer Electronic Signature(s) Signed: 10/22/2021 4:41:59 PM By: Levora Dredge Entered By: Levora Dredge on 10/22/2021 08:09:55 JULIEN, OSCAR ([id number]) -------------------------------------------------------------------------------- Encounter Discharge Information Details Patient Name: [name] Date of Service: 10/22/2021 8:00 AM Medical Record Number: [id number] Patient Account Number: [id number] Date of Birth/Sex: 1981-04-30 (41 y.o. M) Treating RN: Levora Dredge Primary Care Cranford Blessinger: [name] Other Clinician: Referring Shakeela Rabadan: [name] Treating Doss Cybulski/Extender: [name] Cliche in Treatment: 254 Encounter Discharge Information Items Discharge Condition: Stable Ambulatory Status: Ambulatory Discharge Destination: Home Transportation: Private Auto Accompanied By: self Schedule Follow-up Appointment: Yes Clinical Summary of Care: Electronic Signature(s) Signed: 10/22/2021 4:41:59 PM By: Levora Dredge Entered By: Levora Dredge on 10/22/2021 08:24:52 [name] ([id number]) -------------------------------------------------------------------------------- Wound  Assessment Details Patient Name: [name] Date of Service: 10/22/2021 8:00 AM Medical Record Number: [id number] Patient Account Number: [id number] Date of Birth/Sex: 03-Apr-1981 (40 y.o. M) Treating RN: Levora Dredge Primary Care Cyndi Montejano: [name] Other Clinician: Referring Deyra Perdomo: [name] Treating Amillion Scobee/Extender: [name] Cliche in Treatment: 254 Wound Status Wound Number: 1 Primary Etiology: Pyoderma Wound Location: Left, Lateral Lower Leg Wound Status: Open Wounding Event: Gradually Appeared Comorbid History: Sleep Apnea, Hypertension, Colitis Date Acquired: 11/18/2015 Weeks Of Treatment: 254 Clustered Wound: No Wound Measurements Length: (cm) 3.4 Width: (cm) 2.3 Depth: (cm) 0.4 Area: (cm) 6.142 Volume: (cm) 2.457 % Reduction in Area: -25.1% % Reduction in Volume: 37.4% Epithelialization: Small (1-33%) Tunneling: No Undermining: No Wound Description Classification: Full Thickness With Exposed Support Structures Exudate Amount: Medium Exudate Type: Serosanguineous Exudate Color: red, brown Foul Odor After Cleansing: No Slough/Fibrino Yes Wound Bed Granulation Amount: Large (67-100%) Exposed Structure Granulation Quality: Red Fat Layer (Subcutaneous Tissue) Exposed: Yes Necrotic Amount: Small (1-33%) Necrotic Quality: Adherent Slough Treatment Notes Wound #1 (Lower Leg) Wound Laterality: Left, Lateral Cleanser Wound Cleanser Discharge Instruction: Wash your hands with soap and water. Remove old dressing, discard into plastic bag and place into trash. Cleanse the wound with Wound Cleanser prior to applying a clean dressing using gauze sponges, not tissues or cotton balls. Do not scrub or use excessive force. Pat dry using gauze sponges, not tissue or cotton balls. Peri-Wound Care Desitin Maximum Strength Ointment 4 (oz) Discharge Instruction: Apply around wound edges to avoid maceration Triamcinolone Acetonide Cream, 0.1%, 15 (g)  tube Discharge Instruction: Apply to leg Topical Clobetasol Propionate ointment 0.05%, 60 (g) tube Discharge Instruction: Pt to bring to appt- applied to wound bed Primary Dressing Secondary Dressing (NON-BORDER) Zetuvit Plus Silicone NON-BORDER 5x5 (in/in) Secured With Compression Wrap Finerty, Zaydon ([id number]) Medichoice 4 layer Compression System, 35-40 mmHG Discharge Instruction: Apply multi-layer wrap as directed. Compression Stockings Add-Ons Electronic Signature(s) Signed: 10/22/2021 4:41:59 PM By: Levora Dredge Entered By: Levora Dredge on 10/22/2021 08:09:35

## 2021-10-22 NOTE — Progress Notes (Signed)
[name], [name] ([id number]) Visit Report for 10/22/2021 Physician Orders Details Patient Name: [name], [name] Date of Service: 10/22/2021 8:00 AM Medical Record Number: [id number] Patient Account Number: [id number] Date of Birth/Sex: 08-10-1980 (41 y.o. M) Treating RN: Levora Dredge Primary Care Provider: [name] Other Clinician: Referring Provider: [name] Treating Provider/Extender: [name] Cliche in Treatment: [phone] Verbal / Phone Orders: No Diagnosis Coding Follow-up Appointments o Return Appointment in 1 week. o Nurse Visit as needed - twice a week Bathing/ Shower/ Hygiene o Clean wound with Normal Saline or wound cleanser. o May shower with wound dressing protected with water repellent cover or cast protector. o No tub bath. Cellular or Tissue Based Products Wound #1 Left,Lateral Lower Leg o Cellular or Tissue Based Product Type: - Apligraf #2 applied o Cellular or Tissue Based Product applied to wound bed; including contact layer, fixation with steri-strips, dry gauze and cover dressing. (DO NOT REMOVE). Edema Control - Lymphedema / Segmental Compressive Device / Other o Optional: One layer of unna paste to top of compression wrap (to act as an anchor). - He needs this to hold it up o Elevate, Exercise Daily and Avoid Standing for Long Periods of Time. o Elevate legs to the level of the heart and pump ankles as often as possible o Elevate leg(s) parallel to the floor when sitting. Wound Treatment Wound #1 - Lower Leg Wound Laterality: Left, Lateral Cleanser: Wound Cleanser 3 x Per Week/30 Days Discharge Instructions: Wash your hands with soap and water. Remove old dressing, discard into plastic bag and place into trash. Cleanse the wound with Wound Cleanser prior to applying a clean dressing using gauze sponges, not tissues or cotton balls. Do not scrub or use excessive force. Pat dry using gauze sponges, not tissue or cotton  balls. Peri-Wound Care: Desitin Maximum Strength Ointment 4 (oz) 3 x Per Week/30 Days Discharge Instructions: Apply around wound edges to avoid maceration Peri-Wound Care: Triamcinolone Acetonide Cream, 0.1%, 15 (g) tube 3 x Per Week/30 Days Discharge Instructions: Apply to leg Topical: Clobetasol Propionate ointment 0.05%, 60 (g) tube 3 x Per Week/30 Days Discharge Instructions: Pt to bring to appt- applied to wound bed Secondary Dressing: (NON-BORDER) Zetuvit Plus Silicone NON-BORDER 5x5 (in/in) 3 x Per Week/30 Days Compression Wrap: Medichoice 4 layer Compression System, 35-40 mmHG (Generic) 3 x Per Week/30 Days Discharge Instructions: Apply multi-layer wrap as directed. Electronic Signature(s) Signed: 10/22/2021 4:19:54 PM By: Worthy Keeler PA-C Signed: 10/22/2021 4:41:59 PM By: Levora Dredge Entered By: Levora Dredge on 10/22/2021 08:24:28 VIR, WHETSTINE ([id number]) -------------------------------------------------------------------------------- SuperBill Details Patient Name: [name] Date of Service: 10/22/2021 Medical Record Number: [id number] Patient Account Number: [id number] Date of Birth/Sex: [birth year] (41 y.o. M) Treating RN: Levora Dredge Primary Care Provider: [name] Other Clinician: Referring Provider: [name] Treating Provider/Extender: [name] Cliche in Treatment: 254 Diagnosis Coding ICD-10 Codes Code Description I87.2 Venous insufficiency (chronic) (peripheral) L97.222 Non-pressure chronic ulcer of left calf with fat layer exposed E11.622 Type 2 diabetes mellitus with other skin ulcer L88 Pyoderma gangrenosum F17.208 Nicotine dependence, unspecified, with other nicotine-induced disorders Facility Procedures CPT4 Code: 87564332 Description: (Facility Use Only) [place] Modifier: Quantity: 1 Electronic Signature(s) Signed: 10/22/2021 4:19:54 PM By: Worthy Keeler PA-C Signed: 10/22/2021 4:41:59 PM By:  Levora Dredge Entered By: Levora Dredge on 10/22/2021 08:25:07

## 2021-10-24 DIAGNOSIS — I872 Venous insufficiency (chronic) (peripheral): Secondary | ICD-10-CM | POA: Diagnosis not present

## 2021-10-24 NOTE — Unmapped (Signed)
[facility] infusion note

## 2021-10-24 NOTE — Progress Notes (Signed)
IRIS, TATSCH ([id number]) Visit Report for 10/24/2021 Arrival Information Details Patient Name: [name], [name] Date of Service: 10/24/2021 8:00 AM Medical Record Number: [id number] Patient Account Number: [id number] Date of Birth/Sex: 1981/03/14 (41 y.o. M) Treating RN: Levora Dredge Primary Care Timeka Goette: [name] Other Clinician: Referring Lanah Steines: [name] Treating Aayra Hornbaker/Extender: [name] Cliche in Treatment: 2 Visit Information History Since Last Visit Added or deleted any medications: No Patient Arrived: Ambulatory Any new allergies or adverse reactions: No Arrival Time: 08:04 Had a fall or experienced change in No Accompanied By: self activities of daily living that may affect Transfer Assistance: None risk of falls: Patient Identification Verified: Yes Hospitalized since last visit: No Secondary Verification Process Completed: Yes Has Dressing in Place as Prescribed: Yes Patient Requires Transmission-Based No Has Compression in Place as Prescribed: Yes Precautions: Pain Present Now: No Patient Has Alerts: Yes Patient Alerts: Patient has reaction to silver dressings. Electronic Signature(s) Signed: 10/24/2021 4:16:45 PM By: Levora Dredge Entered By: Levora Dredge on 10/24/2021 08:04:31 [name] ([id number]) -------------------------------------------------------------------------------- Clinic Level of Care Assessment Details Patient Name: [name], [name] Date of Service: 10/24/2021 8:00 AM Medical Record Number: [id number] Patient Account Number: [id number] Date of Birth/Sex: Mar 31, 1981 (41 y.o. M) Treating RN: Levora Dredge Primary Care Orest Dygert: [name] Other Clinician: Referring Laury Huizar: [name] Treating Devon Kingdon/Extender: [name] Cliche in Treatment: 255 Clinic Level of Care Assessment Items TOOL 1 Quantity Score []  - Use when EandM and Procedure is performed on INITIAL visit 0 ASSESSMENTS - Nursing Assessment /  Reassessment []  - General Physical Exam (combine w/ comprehensive assessment (listed just below) when performed on new 0 pt. evals) []  - 0 Comprehensive Assessment (HX, ROS, Risk Assessments, Wounds Hx, etc.) ASSESSMENTS - Wound and Skin Assessment / Reassessment []  - Dermatologic / Skin Assessment (not related to wound area) 0 ASSESSMENTS - Ostomy and/or Continence Assessment and Care []  - Incontinence Assessment and Management 0 []  - 0 Ostomy Care Assessment and Management (repouching, etc.) PROCESS - Coordination of Care []  - Simple Patient / Family Education for ongoing care 0 []  - 0 Complex (extensive) Patient / Family Education for ongoing care []  - 0 Staff obtains Programmer, systems, Records, Test Results / Process Orders []  - 0 Staff telephones HHA, Nursing Homes / Clarify orders / etc []  - 0 Routine Transfer to another Facility (non-emergent condition) []  - 0 Routine Hospital Admission (non-emergent condition) []  - 0 New Admissions / Biomedical engineer / Ordering NPWT, Apligraf, etc. []  - 0 Emergency Hospital Admission (emergent condition) PROCESS - Special Needs []  - Pediatric / Minor Patient Management 0 []  - 0 Isolation Patient Management []  - 0 Hearing / Language / Visual special needs []  - 0 Assessment of Community assistance (transportation, D/C planning, etc.) []  - 0 Additional assistance / Altered mentation []  - 0 Support Surface(s) Assessment (bed, cushion, seat, etc.) INTERVENTIONS - Miscellaneous []  - External ear exam 0 []  - 0 Patient Transfer (multiple staff / Civil Service fast streamer / Similar devices) []  - 0 Simple Staple / Suture removal (25 or less) []  - 0 Complex Staple / Suture removal (26 or more) []  - 0 Hypo/Hyperglycemic Management (do not check if billed separately) []  - 0 Ankle / Brachial Index (ABI) - do not check if billed separately Has the patient been seen at the hospital within the last three years: Yes Total Score: 0 Level Of Care:  ____ [name] ([id number]) Electronic Signature(s) Signed: 10/24/2021 4:16:45 PM By: Levora Dredge Entered By: Levora Dredge on 10/24/2021 08:21:46 [name], [name] ([id number]) --------------------------------------------------------------------------------  Compression Therapy Details Patient Name: [name], [name] Date of Service: 10/24/2021 8:00 AM Medical Record Number: [id number] Patient Account Number: [id number] Date of Birth/Sex: 1980-11-15 (41 y.o. M) Treating RN: Levora Dredge Primary Care Jannifer Fischler: [name] Other Clinician: Referring Sierrah Luevano: [name] Treating Matheo Rathbone/Extender: [name] Cliche in Treatment: 255 Compression Therapy Performed for Wound Assessment: Wound #1 Left,Lateral Lower Leg Performed By: Clinician Levora Dredge, RN Compression Type: Four Layer Electronic Signature(s) Signed: 10/24/2021 4:16:45 PM By: Levora Dredge Entered By: Levora Dredge on 10/24/2021 08:09:35 [name], [name] ([id number]) -------------------------------------------------------------------------------- Encounter Discharge Information Details Patient Name: [name] Date of Service: 10/24/2021 8:00 AM Medical Record Number: [id number] Patient Account Number: [id number] Date of Birth/Sex: [birth year] (41 y.o. M) Treating RN: Levora Dredge Primary Care Jahkari Maclin: [name] Other Clinician: Referring Karalina Tift: [name] Treating Roshawnda Pecora/Extender: [name] Cliche in Treatment: 255 Encounter Discharge Information Items Discharge Condition: Stable Ambulatory Status: Ambulatory Discharge Destination: Home Transportation: Private Auto Accompanied By: self Schedule Follow-up Appointment: Yes Clinical Summary of Care: Electronic Signature(s) Signed: 10/24/2021 4:16:45 PM By: Levora Dredge Entered By: Levora Dredge on 10/24/2021 08:21:40 [name] ([id number]) -------------------------------------------------------------------------------- Wound  Assessment Details Patient Name: [name] Date of Service: 10/24/2021 8:00 AM Medical Record Number: [id number] Patient Account Number: [id number] Date of Birth/Sex: 16-Apr-1981 (41 y.o. M) Treating RN: Levora Dredge Primary Care Namir Neto: [name] Other Clinician: Referring Edell Mesenbrink: [name] Treating Cruze Zingaro/Extender: [name] Cliche in Treatment: 255 Wound Status Wound Number: 1 Primary Etiology: Pyoderma Wound Location: Left, Lateral Lower Leg Wound Status: Open Wounding Event: Gradually Appeared Comorbid History: Sleep Apnea, Hypertension, Colitis Date Acquired: 11/18/2015 Weeks Of Treatment: 255 Clustered Wound: No Wound Measurements Length: (cm) 3.4 Width: (cm) 2.3 Depth: (cm) 0.4 Area: (cm) 6.142 Volume: (cm) 2.457 % Reduction in Area: -25.1% % Reduction in Volume: 37.4% Epithelialization: Small (1-33%) Tunneling: No Undermining: No Wound Description Classification: Full Thickness With Exposed Support Structures Exudate Amount: Medium Exudate Type: Serosanguineous Exudate Color: red, brown Foul Odor After Cleansing: No Slough/Fibrino Yes Wound Bed Granulation Amount: Large (67-100%) Exposed Structure Granulation Quality: Red Fat Layer (Subcutaneous Tissue) Exposed: Yes Necrotic Amount: Small (1-33%) Necrotic Quality: Adherent Slough Treatment Notes Wound #1 (Lower Leg) Wound Laterality: Left, Lateral Cleanser Wound Cleanser Discharge Instruction: Wash your hands with soap and water. Remove old dressing, discard into plastic bag and place into trash. Cleanse the wound with Wound Cleanser prior to applying a clean dressing using gauze sponges, not tissues or cotton balls. Do not scrub or use excessive force. Pat dry using gauze sponges, not tissue or cotton balls. Peri-Wound Care Desitin Maximum Strength Ointment 4 (oz) Discharge Instruction: Apply around wound edges to avoid maceration Triamcinolone Acetonide Cream, 0.1%, 15 (g)  tube Discharge Instruction: Apply to leg Topical Clobetasol Propionate ointment 0.05%, 60 (g) tube Discharge Instruction: Pt to bring to appt- applied to wound bed Primary Dressing Secondary Dressing (NON-BORDER) Zetuvit Plus Silicone NON-BORDER 5x5 (in/in) Secured With Compression Wrap [name], [name] ([id number]) Medichoice 4 layer Compression System, 35-40 mmHG Discharge Instruction: Apply multi-layer wrap as directed. Compression Stockings Add-Ons Electronic Signature(s) Signed: 10/24/2021 4:16:45 PM By: Levora Dredge Entered By: Levora Dredge on 10/24/2021 08:09:15

## 2021-10-25 NOTE — Progress Notes (Signed)
RAHIL, PASSEY ([id number]) Visit Report for 10/24/2021 Physician Orders Details Patient Name: [name], [name] Date of Service: 10/24/2021 8:00 AM Medical Record Number: [id number] Patient Account Number: [id number] Date of Birth/Sex: Oct 07, 1980 (41 y.o. M) Treating RN: Levora Dredge Primary Care Provider: [name] Other Clinician: Referring Provider: [name] Treating Provider/Extender: [name] Cliche in Treatment: [phone] Verbal / Phone Orders: No Diagnosis Coding Follow-up Appointments o Return Appointment in 1 week. o Nurse Visit as needed - twice a week Bathing/ Shower/ Hygiene o Clean wound with Normal Saline or wound cleanser. o May shower with wound dressing protected with water repellent cover or cast protector. o No tub bath. Cellular or Tissue Based Products Wound #1 Left,Lateral Lower Leg o Cellular or Tissue Based Product Type: - Apligraf #2 applied o Cellular or Tissue Based Product applied to wound bed; including contact layer, fixation with steri-strips, dry gauze and cover dressing. (DO NOT REMOVE). Edema Control - Lymphedema / Segmental Compressive Device / Other o Optional: One layer of unna paste to top of compression wrap (to act as an anchor). - He needs this to hold it up o Elevate, Exercise Daily and Avoid Standing for Long Periods of Time. o Elevate legs to the level of the heart and pump ankles as often as possible o Elevate leg(s) parallel to the floor when sitting. Wound Treatment Wound #1 - Lower Leg Wound Laterality: Left, Lateral Cleanser: Wound Cleanser 3 x Per Week/30 Days Discharge Instructions: Wash your hands with soap and water. Remove old dressing, discard into plastic bag and place into trash. Cleanse the wound with Wound Cleanser prior to applying a clean dressing using gauze sponges, not tissues or cotton balls. Do not scrub or use excessive force. Pat dry using gauze sponges, not tissue or cotton  balls. Peri-Wound Care: Desitin Maximum Strength Ointment 4 (oz) 3 x Per Week/30 Days Discharge Instructions: Apply around wound edges to avoid maceration Peri-Wound Care: Triamcinolone Acetonide Cream, 0.1%, 15 (g) tube 3 x Per Week/30 Days Discharge Instructions: Apply to leg Topical: Clobetasol Propionate ointment 0.05%, 60 (g) tube 3 x Per Week/30 Days Discharge Instructions: Pt to bring to appt- applied to wound bed Secondary Dressing: (NON-BORDER) Zetuvit Plus Silicone NON-BORDER 5x5 (in/in) 3 x Per Week/30 Days Compression Wrap: Medichoice 4 layer Compression System, 35-40 mmHG (Generic) 3 x Per Week/30 Days Discharge Instructions: Apply multi-layer wrap as directed. Electronic Signature(s) Signed: 10/24/2021 4:16:45 PM By: Levora Dredge Signed: 10/25/2021 4:11:43 PM By: Worthy Keeler PA-C Entered By: Levora Dredge on 10/24/2021 08:21:14 GUTHRIE, LEMME ([id number]) -------------------------------------------------------------------------------- SuperBill Details Patient Name: [name] Date of Service: 10/24/2021 Medical Record Number: [id number] Patient Account Number: [id number] Date of Birth/Sex: April 15, 1981 (41 y.o. 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

## 2021-10-26 ENCOUNTER — Encounter: Payer: 59 | Admitting: Physician Assistant

## 2021-10-26 DIAGNOSIS — I872 Venous insufficiency (chronic) (peripheral): Secondary | ICD-10-CM | POA: Diagnosis not present

## 2021-10-26 NOTE — Progress Notes (Signed)
ROCH, QUACH ([id number]) Visit Report for 10/26/2021 Chief Complaint Document Details Patient Name: [name], [name] Date of Service: 10/26/2021 8:15 AM Medical Record Number: [id number] Patient Account Number: [id number] Date of Birth/Sex: 05/30/1980 (41 y.o. M) Treating RN: Primary Care Provider: [name] Other Clinician: Massie Kluver Referring Provider: [name] Treating Provider/Extender: [name] Cliche in Treatment: 75 Information Obtained from: Patient Chief Complaint He is here in follow up evaluation for LLE pyoderma ulcer Electronic Signature(s) Signed: 10/26/2021 8:13:20 AM By: Worthy Keeler PA-C Entered By: Worthy Keeler on 10/26/2021 08:13:20 BOYD, BUFFALO ([id number]) -------------------------------------------------------------------------------- Problem List Details Patient Name: [name] Date of Service: 10/26/2021 8:15 AM Medical Record Number: [id number] Patient Account Number: [id number] Date of Birth/Sex: 1981-04-09 (40 y.o. M) Treating RN: Primary Care Provider: [name] Other Clinician: Massie Kluver Referring Provider: [name] Treating Provider/Extender: [name] Cliche in Treatment: 62 Active Problems ICD-10 Encounter Code Description Active Date MDM Diagnosis I87.2 Venous insufficiency (chronic) (peripheral) 12/04/2016 No Yes L97.222 Non-pressure chronic ulcer of left calf with fat layer exposed 12/04/2016 No Yes E11.622 Type 2 diabetes mellitus with other skin ulcer 04/09/2021 No Yes L88 Pyoderma gangrenosum 03/26/2017 No Yes F17.208 Nicotine dependence, unspecified, with other nicotine-induced disorders 04/24/2020 No Yes Inactive Problems ICD-10 Code Description Active Date Inactive Date L97.213 Non-pressure chronic ulcer of right calf with necrosis of muscle 04/02/2017 04/02/2017 Resolved Problems ICD-10 Code Description Active Date Resolved Date L97.321 Non-pressure chronic ulcer of left ankle limited to  breakdown of skin 10/08/2019 10/08/2019 L03.116 Cellulitis of left lower limb 05/24/2019 05/24/2019 Electronic Signature(s) Signed: 10/26/2021 8:13:16 AM By: Worthy Keeler PA-C Entered By: Worthy Keeler on 10/26/2021 08:13:16

## 2021-10-26 NOTE — Progress Notes (Addendum)
ARRAN, FESSEL ([id number]) Visit Report for 10/26/2021 Arrival Information Details Patient Name: [name], [name] Date of Service: 10/26/2021 8:15 AM Medical Record Number: [id number] Patient Account Number: [id number] Date of Birth/Sex: 11/30/1980 (41 y.o. M) Treating RN: Levora Dredge Primary Care Yohan Samons: [name] Other Clinician: Massie Kluver Referring Honest Safranek: [name] Treating Malcolm Hetz/Extender: [name] Cliche in Treatment: 61 Visit Information History Since Last Visit Added or deleted any medications: No Patient Arrived: Ambulatory Any new allergies or adverse reactions: No Arrival Time: 08:03 Had a fall or experienced change in No Transfer Assistance: None activities of daily living that may affect Patient Requires Transmission-Based No risk of falls: Precautions: Hospitalized since last visit: No Patient Has Alerts: Yes Pain Present Now: No Patient Alerts: Patient has reaction to silver dressings. Electronic Signature(s) Signed: 10/26/2021 4:23:30 PM By: Massie Kluver Entered By: Massie Kluver on 10/26/2021 08:13:21 [name] ([id number]) -------------------------------------------------------------------------------- Clinic Level of Care Assessment Details Patient Name: [name] Date of Service: 10/26/2021 8:15 AM Medical Record Number: [id number] Patient Account Number: [id number] Date of Birth/Sex: [birth year] (41 y.o. M) Treating RN: Levora Dredge Primary Care Oiva Dibari: [name] Other Clinician: Massie Kluver Referring Rahma Meller: [name] Treating Mohmmad Saleeby/Extender: [name] Cliche in Treatment: 255 Clinic Level of Care Assessment Items TOOL 1 Quantity Score []  - Use when EandM and Procedure is performed on INITIAL visit 0 ASSESSMENTS - Nursing Assessment / Reassessment []  - General Physical Exam (combine w/ comprehensive assessment (listed just below) when performed on new 0 pt. evals) []  - 0 Comprehensive  Assessment (HX, ROS, Risk Assessments, Wounds Hx, etc.) ASSESSMENTS - Wound and Skin Assessment / Reassessment []  - Dermatologic / Skin Assessment (not related to wound area) 0 ASSESSMENTS - Ostomy and/or Continence Assessment and Care []  - Incontinence Assessment and Management 0 []  - 0 Ostomy Care Assessment and Management (repouching, etc.) PROCESS - Coordination of Care []  - Simple Patient / Family Education for ongoing care 0 []  - 0 Complex (extensive) Patient / Family Education for ongoing care []  - 0 Staff obtains Programmer, systems, Records, Test Results / Process Orders []  - 0 Staff telephones HHA, Nursing Homes / Clarify orders / etc []  - 0 Routine Transfer to another Facility (non-emergent condition) []  - 0 Routine Hospital Admission (non-emergent condition) []  - 0 New Admissions / Biomedical engineer / Ordering NPWT, Apligraf, etc. []  - 0 Emergency Hospital Admission (emergent condition) PROCESS - Special Needs []  - Pediatric / Minor Patient Management 0 []  - 0 Isolation Patient Management []  - 0 Hearing / Language / Visual special needs []  - 0 Assessment of Community assistance (transportation, D/C planning, etc.) []  - 0 Additional assistance / Altered mentation []  - 0 Support Surface(s) Assessment (bed, cushion, seat, etc.) INTERVENTIONS - Miscellaneous []  - External ear exam 0 []  - 0 Patient Transfer (multiple staff / Civil Service fast streamer / Similar devices) []  - 0 Simple Staple / Suture removal (25 or less) []  - 0 Complex Staple / Suture removal (26 or more) []  - 0 Hypo/Hyperglycemic Management (do not check if billed separately) []  - 0 Ankle / Brachial Index (ABI) - do not check if billed separately Has the patient been seen at the hospital within the last three years: Yes Total Score: 0 Level Of Care: ____ [name] ([id number]) Electronic Signature(s) Signed: 10/26/2021 4:23:30 PM By: Massie Kluver Entered By: Massie Kluver on 10/26/2021 09:16:44 Deprey,  [name] ([id number]) -------------------------------------------------------------------------------- Compression Therapy Details Patient Name: [name] Date of Service: 10/26/2021 8:15 AM Medical Record Number: [id number] Patient Account Number: [id number] Date  of Birth/Sex: [birth year] (41 y.o. M) Treating RN: Levora Dredge Primary Care Trease Bremner: [name] Other Clinician: Massie Kluver Referring Priyansh Pry: [name] Treating Jackelynn Hosie/Extender: [name] Cliche in Treatment: 255 Compression Therapy Performed for Wound Assessment: Wound #1 Left,Lateral Lower Leg Performed By: Clinician Levora Dredge, RN Compression Type: Four Layer Post Procedure Diagnosis Same as Pre-procedure Electronic Signature(s) Signed: 10/26/2021 4:23:30 PM By: Massie Kluver Entered By: Massie Kluver on 10/26/2021 09:22:00 [name] ([id number]) -------------------------------------------------------------------------------- Encounter Discharge Information Details Patient Name: [name] Date of Service: 10/26/2021 8:15 AM Medical Record Number: [id number] Patient Account Number: [id number] Date of Birth/Sex: Feb 12, 1981 (40 y.o. M) Treating RN: Levora Dredge Primary Care Alicianna Litchford: [name] Other Clinician: Massie Kluver Referring Kashonda Sarkisyan: [name] Treating Crystol Walpole/Extender: [name] Cliche in Treatment: 255 Encounter Discharge Information Items Post Procedure Vitals Discharge Condition: Stable Temperature (F): 98.2 Ambulatory Status: Ambulatory Pulse (bpm): 78 Discharge Destination: Home Respiratory Rate (breaths/min): 16 Transportation: Private Auto Blood Pressure (mmHg): 119/79 Accompanied By: self Schedule Follow-up Appointment: Yes Clinical Summary of Care: Electronic Signature(s) Signed: 10/26/2021 4:23:30 PM By: Massie Kluver Entered By: Massie Kluver on 10/26/2021 09:20:24 [name]  ([id number]) -------------------------------------------------------------------------------- Lower Extremity Assessment Details Patient Name: [name] Date of Service: 10/26/2021 8:15 AM Medical Record Number: [id number] Patient Account Number: [id number] Date of Birth/Sex: 1980-05-27 (40 y.o. M) Treating RN: Primary Care Bethany Cumming: [name] Other Clinician: Massie Kluver Referring Shruti Arrey: [name] Treating Jamare Vanatta/Extender: [name] Cliche in Treatment: 255 Edema Assessment Assessed: [Left: Yes] [Right: No] Edema: [Left: Ye] [Right: s] Calf Left: Right: Point of Measurement: 35 cm From Medial Instep 44.6 cm Ankle Left: Right: Point of Measurement: 10 cm From Medial Instep 28.5 cm Vascular Assessment Pulses: Dorsalis Pedis Palpable: [Left:Yes] Electronic Signature(s) Signed: 10/26/2021 4:23:30 PM By: Massie Kluver Entered By: Massie Kluver on 10/26/2021 08:26:10 Greenblatt, [name] ([id number]) -------------------------------------------------------------------------------- Multi Wound Chart Details Patient Name: [name] Date of Service: 10/26/2021 8:15 AM Medical Record Number: [id number] Patient Account Number: [id number] Date of Birth/Sex: [birth year] (40 y.o. M) Treating RN: Levora Dredge Primary Care Virna Livengood: [name] Other Clinician: Massie Kluver Referring Lurene Robley: [name] Treating Florie Carico/Extender: [name] Cliche in Treatment: 255 Vital Signs Height(in): 71 Pulse(bpm): 78 Weight(lbs): 338 Blood Pressure(mmHg): 119/79 Body Mass Index(BMI): 47.1 Temperature(F): 98.2 Respiratory Rate(breaths/min): 18 Photos: [N/A:N/A] Wound Location: Left, Lateral Lower Leg N/A N/A Wounding Event: Gradually Appeared N/A N/A Primary Etiology: Pyoderma N/A N/A Comorbid History: Sleep Apnea, Hypertension, Colitis N/A N/A Date Acquired: 11/18/2015 N/A N/A Weeks of Treatment: 255 N/A N/A Wound Status: Open N/A N/A Wound Recurrence: No  N/A N/A Measurements L x W x D (cm) 2.9x1.6x0.4 N/A N/A Area (cm) : 3.644 N/A N/A Volume (cm) : 1.458 N/A N/A % Reduction in Area: 25.80% N/A N/A % Reduction in Volume: 62.90% N/A N/A Classification: Full Thickness With Exposed N/A N/A Support Structures Exudate Amount: Medium N/A N/A Exudate Type: Serosanguineous N/A N/A Exudate Color: red, brown N/A N/A Granulation Amount: Large (67-100%) N/A N/A Granulation Quality: Red N/A N/A Necrotic Amount: Small (1-33%) N/A N/A Exposed Structures: Fat Layer (Subcutaneous Tissue): N/A N/A Yes Epithelialization: Small (1-33%) N/A N/A Treatment Notes Electronic Signature(s) Signed: 10/26/2021 4:23:30 PM By: Massie Kluver Entered By: Massie Kluver on 10/26/2021 08:40:53 [name] ([id number]) -------------------------------------------------------------------------------- Multi-Disciplinary Care Plan Details Patient Name: [name] Date of Service: 10/26/2021 8:15 AM Medical Record Number: [id number] Patient Account Number: [id number] Date of Birth/Sex: 1980-09-07 (40 y.o. M) Treating RN: Primary Care Akiah Bauch: [name] Other Clinician: Massie Kluver Referring Diezel Mazur: [name] Treating Graycee Greeson/Extender: Joaquim Lai,  Vance Gather in Treatment: [place] reviewed with physician Active Inactive Electronic Signature(s) Signed: 10/26/2021 4:23:30 PM By: Massie Kluver Entered By: Massie Kluver on 10/26/2021 08:27:05 AERION, BAGDASARIAN ([id number]) -------------------------------------------------------------------------------- Pain Assessment Details Patient Name: [name] Date of Service: 10/26/2021 8:15 AM Medical Record Number: [id number] Patient Account Number: [id number] Date of Birth/Sex: 1980/12/04 (40 y.o. M) Treating RN: Primary Care Aeon Koors: [name] Other Clinician: Massie Kluver Referring Hiral Lukasiewicz: [name] Treating Raheem Kolbe/Extender: [name] Cliche in Treatment:  255 Active Problems Location of Pain Severity and Description of Pain Patient Has Paino No Site Locations Pain Management and Medication Current Pain Management: Electronic Signature(s) Signed: 10/26/2021 4:23:30 PM By: Massie Kluver Entered By: Massie Kluver on 10/26/2021 08:15:57 [name] ([id number]) -------------------------------------------------------------------------------- Patient/Caregiver Education Details Patient Name: [name] Date of Service: 10/26/2021 8:15 AM Medical Record Number: [id number] Patient Account Number: [id number] Date of Birth/Gender: 03/13/1981 (40 y.o. M) Treating RN: Levora Dredge Primary Care Physician: [name] Other Clinician: Massie Kluver Referring Physician: [name] Treating Physician/Extender: [name] Cliche in Treatment: 5 Education Assessment Education Provided To: Patient Education Topics Provided Wound/Skin Impairment: Handouts: Other: continue wound care as directed Methods: Explain/Verbal Responses: State content correctly Electronic Signature(s) Signed: 10/26/2021 4:23:30 PM By: Massie Kluver Entered By: Massie Kluver on 10/26/2021 09:19:05 Grams, [name] ([id number]) -------------------------------------------------------------------------------- Wound Assessment Details Patient Name: [name] Date of Service: 10/26/2021 8:15 AM Medical Record Number: [id number] Patient Account Number: [id number] Date of Birth/Sex: 04/30/1981 (40 y.o. M) Treating RN: Primary Care Madison Direnzo: [name] Other Clinician: Massie Kluver Referring Maura Braaten: [name] Treating Satine Hausner/Extender: [name] Cliche in Treatment: 255 Wound Status Wound Number: 1 Primary Etiology: Pyoderma Wound Location: Left, Lateral Lower Leg Wound Status: Open Wounding Event: Gradually Appeared Comorbid History: Sleep Apnea, Hypertension, Colitis Date Acquired: 11/18/2015 Weeks Of Treatment: 255 Clustered Wound:  No Photos Wound Measurements Length: (cm) 2.9 Width: (cm) 1.6 Depth: (cm) 0.4 Area: (cm) 3.644 Volume: (cm) 1.458 % Reduction in Area: 25.8% % Reduction in Volume: 62.9% Epithelialization: Small (1-33%) Tunneling: No Undermining: No Wound Description Classification: Full Thickness With Exposed Support Structures Exudate Amount: Medium Exudate Type: Serosanguineous Exudate Color: red, brown Foul Odor After Cleansing: No Slough/Fibrino Yes Wound Bed Granulation Amount: Large (67-100%) Exposed Structure Granulation Quality: Red Fat Layer (Subcutaneous Tissue) Exposed: Yes Necrotic Amount: Small (1-33%) Necrotic Quality: Adherent Slough Treatment Notes Wound #1 (Lower Leg) Wound Laterality: Left, Lateral Cleanser Wound Cleanser Discharge Instruction: Wash your hands with soap and water. Remove old dressing, discard into plastic bag and place into trash. Cleanse the wound with Wound Cleanser prior to applying a clean dressing using gauze sponges, not tissues or cotton balls. Do not scrub or use excessive force. Pat dry using gauze sponges, not tissue or cotton balls. Peri-Wound Care Desitin Maximum Strength Ointment 4 (oz) Bagsby, C[place] ([id number]) Discharge Instruction: Apply around wound edges to avoid maceration Triamcinolone Acetonide Cream, 0.1%, 15 (g) tube Discharge Instruction: Apply to leg Topical Clobetasol Propionate ointment 0.05%, 60 (g) tube Discharge Instruction: Pt to bring to appt- applied to wound bed Primary Dressing Secondary Dressing (NON-BORDER) Zetuvit Plus Silicone NON-BORDER 5x5 (in/in) Secured With Compression Wrap Medichoice 4 layer Compression System, 35-40 mmHG Discharge Instruction: Apply multi-layer wrap as directed. Compression Stockings Add-Ons Electronic Signature(s) Signed: 10/26/2021 4:23:30 PM By: Massie Kluver Entered By: Massie Kluver on 10/26/2021 08:25:01 MRK, BUZBY  ([id number]) -------------------------------------------------------------------------------- Vitals Details Patient Name: [name] Date of Service: 10/26/2021 8:15 AM Medical Record Number: [id number] Patient Account Number: [id number] Date of Birth/Sex: 11/09/1980 (40  y.o. M) Treating RN: Levora Dredge Primary Care Corleen Otwell: [name] Other Clinician: Massie Kluver Referring Lyllie Cobbins: [name] Treating Sabrinia Prien/Extender: [name] Cliche in Treatment: 255 Vital Signs Time Taken: 08:13 Temperature (F): 98.2 Height (in): 71 Pulse (bpm): 78 Weight (lbs): 338 Respiratory Rate (breaths/min): 18 Body Mass Index (BMI): 47.1 Blood Pressure (mmHg): 119/79 Reference Range: 80 - 120 mg / dl Electronic Signature(s) Signed: 10/26/2021 4:23:30 PM By: Massie Kluver Entered By: Massie Kluver on 10/26/2021 08:15:22

## 2021-10-29 DIAGNOSIS — I872 Venous insufficiency (chronic) (peripheral): Secondary | ICD-10-CM | POA: Diagnosis not present

## 2021-10-29 NOTE — Progress Notes (Signed)
[name], [name] ([id number]) Visit Report for 10/29/2021 Physician Orders Details Patient Name: [name], [name] Date of Service: 10/29/2021 8:15 AM Medical Record Number: [id number] Patient Account Number: [id number] Date of Birth/Sex: October 21, 1980 (41 y.o. M) Treating RN: [name] Primary Care Provider: [name] Other Clinician: Massie Kluver Referring Provider: [name] Treating Provider/Extender: [name] Cliche in Treatment: [phone] Verbal / Phone Orders: No Diagnosis Coding Follow-up Appointments o Return Appointment in 1 week. o Nurse Visit as needed - twice a week Bathing/ Shower/ Hygiene o Clean wound with Normal Saline or wound cleanser. o May shower with wound dressing protected with water repellent cover or cast protector. o No tub bath. Cellular or Tissue Based Products Wound #1 Left,Lateral Lower Leg o Cellular or Tissue Based Product Type: - Apligraf #3 applied o Cellular or Tissue Based Product applied to wound bed; including contact layer, fixation with steri-strips, dry gauze and cover dressing. (DO NOT REMOVE). Edema Control - Lymphedema / Segmental Compressive Device / Other o Optional: One layer of unna paste to top of compression wrap (to act as an anchor). - He needs this to hold it up o Elevate, Exercise Daily and Avoid Standing for Long Periods of Time. o Elevate legs to the level of the heart and pump ankles as often as possible o Elevate leg(s) parallel to the floor when sitting. Wound Treatment Wound #1 - Lower Leg Wound Laterality: Left, Lateral Cleanser: Wound Cleanser 3 x Per Week/30 Days Discharge Instructions: Wash your hands with soap and water. Remove old dressing, discard into plastic bag and place into trash. Cleanse the wound with Wound Cleanser prior to applying a clean dressing using gauze sponges, not tissues or cotton balls. Do not scrub or use excessive force. Pat dry using gauze sponges, not tissue or cotton  balls. Peri-Wound Care: Desitin Maximum Strength Ointment 4 (oz) 3 x Per Week/30 Days Discharge Instructions: Apply around wound edges to avoid maceration Peri-Wound Care: Triamcinolone Acetonide Cream, 0.1%, 15 (g) tube 3 x Per Week/30 Days Discharge Instructions: Apply to leg Topical: Clobetasol Propionate ointment 0.05%, 60 (g) tube 3 x Per Week/30 Days Discharge Instructions: Pt to bring to appt- applied to wound bed Secondary Dressing: (NON-BORDER) Zetuvit Plus Silicone NON-BORDER 5x5 (in/in) 3 x Per Week/30 Days Compression Wrap: Medichoice 4 layer Compression System, 35-40 mmHG (Generic) 3 x Per Week/30 Days Discharge Instructions: Apply multi-layer wrap as directed. Electronic Signature(s) Signed: 10/29/2021 3:46:28 PM By: Worthy Keeler PA-C Signed: 10/29/2021 4:12:58 PM By: Massie Kluver Entered By: Massie Kluver on 10/29/2021 08:39:48 [name] ([id number]) -------------------------------------------------------------------------------- SuperBill Details Patient Name: [name] Date of Service: 10/29/2021 Medical Record Number: [id number] Patient Account Number: [id number] Date of Birth/Sex: [birth year] (41 y.o. 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

## 2021-10-30 NOTE — Progress Notes (Signed)
[name], [name] ([id number]) Visit Report for 10/29/2021 Arrival Information Details Patient Name: [name], [name] Date of Service: 10/29/2021 8:15 AM Medical Record Number: [id number] Patient Account Number: [id number] Date of Birth/Sex: 1980-07-21 (41 y.o. M) Treating RN: [name] Primary Care [name]: [name] Other Clinician: Massie [name] Referring [name]: [name] Treating [name]/Extender: [name] in Treatment: 96 Visit Information History Since Last Visit Added or deleted any medications: No Patient Arrived: Ambulatory Any new allergies or adverse reactions: No Arrival Time: 08:10 Had a fall or experienced change in No Transfer Assistance: None activities of daily living that may affect Patient Requires Transmission-Based No risk of falls: Precautions: Signs or symptoms of abuse/neglect since last visito No Patient Has Alerts: Yes Hospitalized since last visit: No Patient Alerts: Patient has reaction Pain Present Now: No to silver dressings. Electronic Signature(s) Signed: 10/29/2021 4:12:58 PM By: [name] Entered By: [name] on 10/29/2021 08:10:44 [name] ([id number]) -------------------------------------------------------------------------------- Clinic Level of Care Assessment Details Patient Name: [name], [name] Date of Service: 10/29/2021 8:15 AM Medical Record Number: [id number] Patient Account Number: [id number] Date of Birth/Sex: October 29, 1980 (41 y.o. M) Treating RN: [name] Primary Care [name]: [name] Other Clinician: Massie [name] Referring [name]: [name] Treating [name]/Extender: [name] in Treatment: 255 Clinic Level of Care Assessment Items TOOL 1 Quantity Score []  - Use when EandM and Procedure is performed on INITIAL visit 0 ASSESSMENTS - Nursing Assessment / Reassessment []  - General Physical Exam (combine w/ comprehensive assessment (listed just below) when performed  on new 0 pt. evals) []  - 0 Comprehensive Assessment (HX, ROS, Risk Assessments, Wounds Hx, etc.) ASSESSMENTS - Wound and Skin Assessment / Reassessment []  - Dermatologic / Skin Assessment (not related to wound area) 0 ASSESSMENTS - Ostomy and/or Continence Assessment and Care []  - Incontinence Assessment and Management 0 []  - 0 Ostomy Care Assessment and Management (repouching, etc.) PROCESS - Coordination of Care []  - Simple Patient / Family Education for ongoing care 0 []  - 0 Complex (extensive) Patient / Family Education for ongoing care []  - 0 Staff obtains Programmer, systems, Records, Test Results / Process Orders []  - 0 Staff telephones [name], Nursing Homes / Clarify orders / etc []  - 0 Routine Transfer to another Facility (non-emergent condition) []  - 0 Routine Hospital Admission (non-emergent condition) []  - 0 New Admissions / Biomedical engineer / Ordering NPWT, Apligraf, etc. []  - 0 Emergency Hospital Admission (emergent condition) PROCESS - Special Needs []  - Pediatric / Minor Patient Management 0 []  - 0 Isolation Patient Management []  - 0 Hearing / Language / Visual special needs []  - 0 Assessment of Community assistance (transportation, D/C planning, etc.) []  - 0 Additional assistance / Altered mentation []  - 0 Support Surface(s) Assessment (bed, cushion, seat, etc.) INTERVENTIONS - Miscellaneous []  - External ear exam 0 []  - 0 Patient Transfer (multiple staff / Civil Service fast streamer / Similar devices) []  - 0 Simple Staple / Suture removal (25 or less) []  - 0 Complex Staple / Suture removal (26 or more) []  - 0 Hypo/Hyperglycemic Management (do not check if billed separately) []  - 0 Ankle / Brachial Index (ABI) - do not check if billed separately Has the patient been seen at the hospital within the last three years: Yes Total Score: 0 Level Of Care: ____ [name] ([id number]) Electronic Signature(s) Signed: 10/29/2021 4:12:58 PM By: [name] Entered By:  [name] on 10/29/2021 08:40:22 [name], [name] ([id number]) -------------------------------------------------------------------------------- Compression Therapy Details Patient Name: [name] Date of Service: 10/29/2021 8:15 AM  Medical Record Number: [id number] Patient Account Number: [id number] Date of Birth/Sex: Apr 02, 1981 (40 y.o. M) Treating RN: [name] Primary Care Karthika Glasper: [name] Other Clinician: Massie [name] Referring Teniyah Seivert: [name] Treating [name]/Extender: [name] Weeks in Treatment: 255 Compression Therapy Performed for Wound Assessment: Wound #1 Left,Lateral Lower Leg Performed By: [name], [name], Compression Type: Four Layer Pre Treatment ABI: 1.2 Electronic Signature(s) Signed: 10/29/2021 4:12:58 PM By: [name] Entered By: [name] on 10/29/2021 08:39:26 [name] ([id number]) -------------------------------------------------------------------------------- Encounter Discharge Information Details Patient Name: [name] Date of Service: 10/29/2021 8:15 AM Medical Record Number: [id number] Patient Account Number: [id number] Date of Birth/Sex: [birth year] (40 y.o. M) Treating RN: [name] Primary Care [name]: [name] Other Clinician: Massie [name] Referring Brigett Estell: [name] Treating [name]/Extender: [name] in Treatment: 255 Encounter Discharge Information Items Discharge Condition: Stable Ambulatory Status: Ambulatory Discharge Destination: Home Transportation: Private Auto Accompanied By: self Schedule Follow-up Appointment: Yes Clinical Summary of Care: Electronic Signature(s) Signed: 10/29/2021 4:12:58 PM By: [name] Entered By: [name] on 10/29/2021 08:40:14 [name] ([id number]) -------------------------------------------------------------------------------- Wound Assessment Details Patient Name: [name] Date of Service: 10/29/2021  8:15 AM Medical Record Number: [id number] Patient Account Number: [id number] Date of Birth/Sex: 16-Jul-1980 (40 y.o. M) Treating RN: [name] Primary Care Taelor Waymire: [name] Other Clinician: Massie [name] Referring Sofhia Ulibarri: [name] Treating Khya [name]/Extender: [name] in Treatment: 255 Wound Status Wound Number: 1 Primary Etiology: Pyoderma Wound Location: Left, Lateral Lower Leg Wound Status: Open Wounding Event: Gradually Appeared Date Acquired: 11/18/2015 Weeks Of Treatment: 255 Clustered Wound: No Wound Measurements Length: (cm) 2.9 Width: (cm) 1.6 Depth: (cm) 0.4 Area: (cm) 3.644 Volume: (cm) 1.458 % Reduction in Area: 25.8% % Reduction in Volume: 62.9% Wound Description Classification: Full Thickness With Exposed Support Structures Exudate Amount: Medium Exudate Type: Serosanguineous Exudate Color: red, brown Electronic Signature(s) Signed: 10/29/2021 4:12:58 PM By: [name] Signed: 10/30/2021 12:11:05 PM By: [name], [name], RN, [name], [name], [name] Entered By: [name] on 10/29/2021 08:34:55

## 2021-10-31 DIAGNOSIS — I872 Venous insufficiency (chronic) (peripheral): Secondary | ICD-10-CM | POA: Diagnosis not present

## 2021-10-31 NOTE — Progress Notes (Signed)
[name], [name] ([id number]) Visit Report for 10/31/2021 Physician Orders Details Patient Name: [name], [name] Date of Service: 10/31/2021 8:00 AM Medical Record Number: [id number] Patient Account Number: [id number] Date of Birth/Sex: 10-11-1980 (41 y.o. M) Treating RN: Levora Dredge Primary Care Provider: [name] Other Clinician: Referring Provider: [name] Treating Provider/Extender: Yaakov Guthrie in Treatment: 256 Verbal / Phone Orders: No Diagnosis Coding Follow-up Appointments o Return Appointment in 1 week. o Nurse Visit as needed - twice a week Bathing/ Shower/ Hygiene o Clean wound with Normal Saline or wound cleanser. o May shower with wound dressing protected with water repellent cover or cast protector. o No tub bath. Cellular or Tissue Based Products Wound #1 Left,Lateral Lower Leg o Cellular or Tissue Based Product Type: - Apligraf #3 applied o Cellular or Tissue Based Product applied to wound bed; including contact layer, fixation with steri-strips, dry gauze and cover dressing. (DO NOT REMOVE). Edema Control - Lymphedema / Segmental Compressive Device / Other o Optional: One layer of unna paste to top of compression wrap (to act as an anchor). - He needs this to hold it up o Elevate, Exercise Daily and Avoid Standing for Long Periods of Time. o Elevate legs to the level of the heart and pump ankles as often as possible o Elevate leg(s) parallel to the floor when sitting. Wound Treatment Wound #1 - Lower Leg Wound Laterality: Left, Lateral Cleanser: Wound Cleanser 3 x Per Week/30 Days Discharge Instructions: Wash your hands with soap and water. Remove old dressing, discard into plastic bag and place into trash. Cleanse the wound with Wound Cleanser prior to applying a clean dressing using gauze sponges, not tissues or cotton balls. Do not scrub or use excessive force. Pat dry using gauze sponges, not tissue or cotton  balls. Peri-Wound Care: Desitin Maximum Strength Ointment 4 (oz) 3 x Per Week/30 Days Discharge Instructions: Apply around wound edges to avoid maceration Peri-Wound Care: Triamcinolone Acetonide Cream, 0.1%, 15 (g) tube 3 x Per Week/30 Days Discharge Instructions: Apply to leg Topical: Clobetasol Propionate ointment 0.05%, 60 (g) tube 3 x Per Week/30 Days Discharge Instructions: Pt to bring to appt- applied to wound bed Secondary Dressing: (NON-BORDER) Zetuvit Plus Silicone NON-BORDER 5x5 (in/in) 3 x Per Week/30 Days Compression Wrap: Medichoice 4 layer Compression System, 35-40 mmHG (Generic) 3 x Per Week/30 Days Discharge Instructions: Apply multi-layer wrap as directed. Electronic Signature(s) Signed: 10/31/2021 11:59:07 AM By: Kalman Shan DO Signed: 10/31/2021 4:18:56 PM By: Levora Dredge Entered By: Levora Dredge on 10/31/2021 08:22:48 DENIZ, HANNAN ([id number]) -------------------------------------------------------------------------------- SuperBill Details Patient Name: [name] Date of Service: 10/31/2021 Medical Record Number: [id number] Patient Account Number: [id number] Date of Birth/Sex: [birth year] (41 y.o. 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

## 2021-10-31 NOTE — Progress Notes (Signed)
[name], [name] ([id number]) Visit Report for 10/31/2021 Arrival Information Details Patient Name: [name], [name] Date of Service: 10/31/2021 8:00 AM Medical Record Number: [id number] Patient Account Number: [id number] Date of Birth/Sex: March 15, 1981 (40 y.o. M) Treating RN: Levora Dredge Primary Care Derik Fults: [name] Other Clinician: Referring Jihan Rudy: [name] Treating Keanu Lesniak/Extender: Yaakov Guthrie in Treatment: 256 Visit Information History Since Last Visit Added or deleted any medications: No Patient Arrived: Ambulatory Any new allergies or adverse reactions: No Arrival Time: 08:10 Had a fall or experienced change in No Accompanied By: self activities of daily living that may affect Transfer Assistance: None risk of falls: Patient Identification Verified: Yes Hospitalized since last visit: No Secondary Verification Process Completed: Yes Has Dressing in Place as Prescribed: Yes Patient Requires Transmission-Based No Has Compression in Place as Prescribed: Yes Precautions: Pain Present Now: No Patient Has Alerts: Yes Patient Alerts: Patient has reaction to silver dressings. Electronic Signature(s) Signed: 10/31/2021 4:18:56 PM By: Levora Dredge Entered By: Levora Dredge on 10/31/2021 08:11:07 [name] ([id number]) -------------------------------------------------------------------------------- Clinic Level of Care Assessment Details Patient Name: [name], [name] Date of Service: 10/31/2021 8:00 AM Medical Record Number: [id number] Patient Account Number: [id number] Date of Birth/Sex: Mar 28, 1981 (40 y.o. M) Treating RN: Levora Dredge Primary Care Cosima Prentiss: [name] Other Clinician: Referring Gao Mitnick: [name] Treating Cadyn Fann/Extender: Yaakov Guthrie in Treatment: 256 Clinic Level of Care Assessment Items TOOL 1 Quantity Score []  - Use when EandM and Procedure is performed on INITIAL visit 0 ASSESSMENTS - Nursing  Assessment / Reassessment []  - General Physical Exam (combine w/ comprehensive assessment (listed just below) when performed on new 0 pt. evals) []  - 0 Comprehensive Assessment (HX, ROS, Risk Assessments, Wounds Hx, etc.) ASSESSMENTS - Wound and Skin Assessment / Reassessment []  - Dermatologic / Skin Assessment (not related to wound area) 0 ASSESSMENTS - Ostomy and/or Continence Assessment and Care []  - Incontinence Assessment and Management 0 []  - 0 Ostomy Care Assessment and Management (repouching, etc.) PROCESS - Coordination of Care []  - Simple Patient / Family Education for ongoing care 0 []  - 0 Complex (extensive) Patient / Family Education for ongoing care []  - 0 Staff obtains Programmer, systems, Records, Test Results / Process Orders []  - 0 Staff telephones HHA, Nursing Homes / Clarify orders / etc []  - 0 Routine Transfer to another Facility (non-emergent condition) []  - 0 Routine Hospital Admission (non-emergent condition) []  - 0 New Admissions / Biomedical engineer / Ordering NPWT, Apligraf, etc. []  - 0 Emergency Hospital Admission (emergent condition) PROCESS - Special Needs []  - Pediatric / Minor Patient Management 0 []  - 0 Isolation Patient Management []  - 0 Hearing / Language / Visual special needs []  - 0 Assessment of Community assistance (transportation, D/C planning, etc.) []  - 0 Additional assistance / Altered mentation []  - 0 Support Surface(s) Assessment (bed, cushion, seat, etc.) INTERVENTIONS - Miscellaneous []  - External ear exam 0 []  - 0 Patient Transfer (multiple staff / Civil Service fast streamer / Similar devices) []  - 0 Simple Staple / Suture removal (25 or less) []  - 0 Complex Staple / Suture removal (26 or more) []  - 0 Hypo/Hyperglycemic Management (do not check if billed separately) []  - 0 Ankle / Brachial Index (ABI) - do not check if billed separately Has the patient been seen at the hospital within the last three years: Yes Total Score: 0 Level Of  Care: ____ [name] ([id number]) Electronic Signature(s) Signed: 10/31/2021 4:18:56 PM By: Levora Dredge Entered By: Levora Dredge on 10/31/2021 08:23:20 [name], [name] ([id number]) --------------------------------------------------------------------------------  Compression Therapy Details Patient Name: [name], [name] Date of Service: 10/31/2021 8:00 AM Medical Record Number: [id number] Patient Account Number: [id number] Date of Birth/Sex: December 12, 1980 (40 y.o. M) Treating RN: Levora Dredge Primary Care Arwa [name]: [name] Other Clinician: Referring Belle Charlie: [name] Treating Hurbert Duran/Extender: Yaakov Guthrie in Treatment: 256 Compression Therapy Performed for Wound Assessment: Wound #1 Left,Lateral Lower Leg Performed By: Clinician Levora Dredge, RN Compression Type: Four Layer Electronic Signature(s) Signed: 10/31/2021 4:18:56 PM By: Levora Dredge Entered By: Levora Dredge on 10/31/2021 08:22:33 [name], [name] ([id number]) -------------------------------------------------------------------------------- Encounter Discharge Information Details Patient Name: [name] Date of Service: 10/31/2021 8:00 AM Medical Record Number: [id number] Patient Account Number: [id number] Date of Birth/Sex: April 01, 1981 (40 y.o. M) Treating RN: Levora Dredge Primary Care Saoirse Legere: [name] Other Clinician: Referring Shamir Tuzzolino: [name] Treating Darthy Manganelli/Extender: Yaakov Guthrie in Treatment: 256 Encounter Discharge Information Items Discharge Condition: Stable Ambulatory Status: Ambulatory Discharge Destination: Home Transportation: Private Auto Accompanied By: self Schedule Follow-up Appointment: Yes Clinical Summary of Care: Electronic Signature(s) Signed: 10/31/2021 4:18:56 PM By: Levora Dredge Entered By: Levora Dredge on 10/31/2021 08:23:14 [name]  ([id number]) -------------------------------------------------------------------------------- Wound Assessment Details Patient Name: [name] Date of Service: 10/31/2021 8:00 AM Medical Record Number: [id number] Patient Account Number: [id number] Date of Birth/Sex: 1981-03-21 (40 y.o. M) Treating RN: Levora Dredge Primary Care Gresia Isidoro: [name] Other Clinician: Referring Jessicia Napolitano: [name] Treating Susane Bey/Extender: Yaakov Guthrie in Treatment: 256 Wound Status Wound Number: 1 Primary Etiology: Pyoderma Wound Location: Left, Lateral Lower Leg Wound Status: Open Wounding Event: Gradually Appeared Comorbid History: Sleep Apnea, Hypertension, Colitis Date Acquired: 11/18/2015 Weeks Of Treatment: 256 Clustered Wound: No Wound Measurements Length: (cm) 2.9 Width: (cm) 1.6 Depth: (cm) 0.4 Area: (cm) 3.644 Volume: (cm) 1.458 % Reduction in Area: 25.8% % Reduction in Volume: 62.9% Epithelialization: None Tunneling: No Undermining: No Wound Description Classification: Full Thickness With Exposed Support Structures Exudate Amount: Medium Exudate Type: Serosanguineous Exudate Color: red, brown Treatment Notes Wound #1 (Lower Leg) Wound Laterality: Left, Lateral Cleanser Wound Cleanser Discharge Instruction: Wash your hands with soap and water. Remove old dressing, discard into plastic bag and place into trash. Cleanse the wound with Wound Cleanser prior to applying a clean dressing using gauze sponges, not tissues or cotton balls. Do not scrub or use excessive force. Pat dry using gauze sponges, not tissue or cotton balls. Peri-Wound Care Desitin Maximum Strength Ointment 4 (oz) Discharge Instruction: Apply around wound edges to avoid maceration Triamcinolone Acetonide Cream, 0.1%, 15 (g) tube Discharge Instruction: Apply to leg Topical Clobetasol Propionate ointment 0.05%, 60 (g) tube Discharge Instruction: Pt to bring to appt- applied to wound  bed Primary Dressing Secondary Dressing (NON-BORDER) Zetuvit Plus Silicone NON-BORDER 5x5 (in/in) Secured With Compression Wrap Medichoice 4 layer Compression System, 35-40 mmHG Discharge Instruction: Apply multi-layer wrap as directed. Compression Stockings Add-Ons [name], [name] ([id number]) Electronic Signature(s) Signed: 10/31/2021 4:18:56 PM By: Levora Dredge Entered By: Levora Dredge on 10/31/2021 08:11:32

## 2021-11-02 DIAGNOSIS — I872 Venous insufficiency (chronic) (peripheral): Secondary | ICD-10-CM | POA: Diagnosis not present

## 2021-11-05 DIAGNOSIS — I872 Venous insufficiency (chronic) (peripheral): Secondary | ICD-10-CM | POA: Diagnosis not present

## 2021-11-05 NOTE — Progress Notes (Addendum)
[name], [name] ([id number]) Visit Report for 11/05/2021 Arrival Information Details Patient Name: [name], [name] Date of Service: 11/05/2021 8:00 AM Medical Record Number: [id number] Patient Account Number: [id number] Date of Birth/Sex: 1981/02/19 (40 y.o. M) Treating RN: Levora Dredge Primary Care Chistopher Mangino: [name] Other Clinician: Referring Feather Berrie: [name] Treating Ahlivia Salahuddin/Extender: [name] Cliche in Treatment: 73 Visit Information History Since Last Visit Added or deleted any medications: No Patient Arrived: Ambulatory Any new allergies or adverse reactions: No Arrival Time: 09:17 Had a fall or experienced change in No Accompanied By: self activities of daily living that may affect Transfer Assistance: None risk of falls: Patient Identification Verified: Yes Hospitalized since last visit: No Secondary Verification Process Completed: Yes Has Dressing in Place as Prescribed: Yes Patient Requires Transmission-Based No Has Compression in Place as Prescribed: Yes Precautions: Pain Present Now: No Patient Has Alerts: Yes Patient Alerts: Patient has reaction to silver dressings. Electronic Signature(s) Signed: 11/05/2021 9:17:46 AM By: Levora Dredge Entered By: Levora Dredge on 11/05/2021 09:17:46 [name] ([id number]) -------------------------------------------------------------------------------- Clinic Level of Care Assessment Details Patient Name: [name], [name] Date of Service: 11/05/2021 8:00 AM Medical Record Number: [id number] Patient Account Number: [id number] Date of Birth/Sex: 07-03-1980 (40 y.o. M) Treating RN: Levora Dredge Primary Care Camilia Caywood: [name] Other Clinician: Referring Govanni Plemons: [name] Treating Saphia Vanderford/Extender: [name] Cliche in Treatment: 256 Clinic Level of Care Assessment Items TOOL 1 Quantity Score []  - Use when EandM and Procedure is performed on INITIAL visit 0 ASSESSMENTS - Nursing  Assessment / Reassessment []  - General Physical Exam (combine w/ comprehensive assessment (listed just below) when performed on new 0 pt. evals) []  - 0 Comprehensive Assessment (HX, ROS, Risk Assessments, Wounds Hx, etc.) ASSESSMENTS - Wound and Skin Assessment / Reassessment []  - Dermatologic / Skin Assessment (not related to wound area) 0 ASSESSMENTS - Ostomy and/or Continence Assessment and Care []  - Incontinence Assessment and Management 0 []  - 0 Ostomy Care Assessment and Management (repouching, etc.) PROCESS - Coordination of Care []  - Simple Patient / Family Education for ongoing care 0 []  - 0 Complex (extensive) Patient / Family Education for ongoing care []  - 0 Staff obtains Programmer, systems, Records, Test Results / Process Orders []  - 0 Staff telephones HHA, Nursing Homes / Clarify orders / etc []  - 0 Routine Transfer to another Facility (non-emergent condition) []  - 0 Routine Hospital Admission (non-emergent condition) []  - 0 New Admissions / Biomedical engineer / Ordering NPWT, Apligraf, etc. []  - 0 Emergency Hospital Admission (emergent condition) PROCESS - Special Needs []  - Pediatric / Minor Patient Management 0 []  - 0 Isolation Patient Management []  - 0 Hearing / Language / Visual special needs []  - 0 Assessment of Community assistance (transportation, D/C planning, etc.) []  - 0 Additional assistance / Altered mentation []  - 0 Support Surface(s) Assessment (bed, cushion, seat, etc.) INTERVENTIONS - Miscellaneous []  - External ear exam 0 []  - 0 Patient Transfer (multiple staff / Civil Service fast streamer / Similar devices) []  - 0 Simple Staple / Suture removal (25 or less) []  - 0 Complex Staple / Suture removal (26 or more) []  - 0 Hypo/Hyperglycemic Management (do not check if billed separately) []  - 0 Ankle / Brachial Index (ABI) - do not check if billed separately Has the patient been seen at the hospital within the last three years: Yes Total Score: 0 Level Of  Care: ____ [name] ([id number]) Electronic Signature(s) Unsigned Entered By: Levora Dredge on 11/05/2021 09:45:43 Signature(s): Date(s): [name] ([id number]) -------------------------------------------------------------------------------- Encounter Discharge Information Details  Patient Name: [name], [name] Date of Service: 11/05/2021 8:00 AM Medical Record Number: [id number] Patient Account Number: [id number] Date of Birth/Sex: Mar 01, 1981 (40 y.o. M) Treating RN: Levora Dredge Primary Care Tejasvi Brissett: [name] Other Clinician: Referring Natallie Ravenscroft: [name] Treating Novak Stgermaine/Extender: [name] Cliche in Treatment: 256 Encounter Discharge Information Items Discharge Condition: Stable Ambulatory Status: Ambulatory Discharge Destination: Home Transportation: Private Auto Accompanied By: self Schedule Follow-up Appointment: Yes Clinical Summary of Care: Electronic Signature(s) Signed: 11/05/2021 9:45:37 AM By: Levora Dredge Entered By: Levora Dredge on 11/05/2021 09:45:37 [name] ([id number]) -------------------------------------------------------------------------------- Wound Assessment Details Patient Name: [name] Date of Service: 11/05/2021 8:00 AM Medical Record Number: [id number] Patient Account Number: [id number] Date of Birth/Sex: [birth year] (40 y.o. M) Treating RN: Levora Dredge Primary Care Airika Alkhatib: [name] Other Clinician: Referring Ferol Laiche: [name] Treating Lucine Bilski/Extender: [name] Cliche in Treatment: 256 Wound Status Wound Number: 1 Primary Etiology: Pyoderma Wound Location: Left, Lateral Lower Leg Wound Status: Open Wounding Event: Gradually Appeared Comorbid History: Sleep Apnea, Hypertension, Colitis Date Acquired: 11/18/2015 Weeks Of Treatment: 256 Clustered Wound: No Wound Measurements Length: (cm) 2.9 Width: (cm) 1.6 Depth: (cm) 0.4 Area: (cm) 3.644 Volume: (cm) 1.458 % Reduction in  Area: 25.8% % Reduction in Volume: 62.9% Epithelialization: None Tunneling: No Undermining: No Wound Description Classification: Full Thickness With Exposed Support Structures Exudate Amount: Medium Exudate Type: Serosanguineous Exudate Color: red, brown Treatment Notes Wound #1 (Lower Leg) Wound Laterality: Left, Lateral Cleanser Wound Cleanser Discharge Instruction: Wash your hands with soap and water. Remove old dressing, discard into plastic bag and place into trash. Cleanse the wound with Wound Cleanser prior to applying a clean dressing using gauze sponges, not tissues or cotton balls. Do not scrub or use excessive force. Pat dry using gauze sponges, not tissue or cotton balls. Peri-Wound Care Desitin Maximum Strength Ointment 4 (oz) Discharge Instruction: Apply around wound edges to avoid maceration Triamcinolone Acetonide Cream, 0.1%, 15 (g) tube Discharge Instruction: Apply to leg Topical Clobetasol Propionate ointment 0.05%, 60 (g) tube Discharge Instruction: Pt to bring to appt- applied to wound bed Primary Dressing Secondary Dressing (NON-BORDER) Zetuvit Plus Silicone NON-BORDER 5x5 (in/in) Secured With Compression Wrap Medichoice 4 layer Compression System, 35-40 mmHG Discharge Instruction: Apply multi-layer wrap as directed. Compression Stockings Add-Ons IZSAK, MEIR ([id number]) Electronic Signature(s) Signed: 11/05/2021 9:44:16 AM By: Levora Dredge Entered By: Levora Dredge on 11/05/2021 09:44:15

## 2021-11-05 NOTE — Progress Notes (Signed)
[name], [name] ([id number]) Visit Report for 11/02/2021 Physician Orders Details Patient Name: [name] Date of Service: 11/02/2021 8:15 AM Medical Record Number: [id number] Patient Account Number: [id number] Date of Birth/Sex: 11-10-1980 (41 y.o. M) Treating RN: [name] Primary Care Provider: [name] Other Clinician: Massie Kluver Referring Provider: [name] Treating Provider/Extender: [name] Cliche in Treatment: 256 Verbal / Phone Orders: No Diagnosis Coding Follow-up Appointments o Return Appointment in 1 week. o Nurse Visit as needed - twice a week Bathing/ Shower/ Hygiene o Clean wound with Normal Saline or wound cleanser. o May shower with wound dressing protected with water repellent cover or cast protector. o No tub bath. Cellular or Tissue Based Products Wound #1 Left,Lateral Lower Leg o Cellular or Tissue Based Product Type: - Apligraf #3 applied o Cellular or Tissue Based Product applied to wound bed; including contact layer, fixation with steri-strips, dry gauze and cover dressing. (DO NOT REMOVE). Edema Control - Lymphedema / Segmental Compressive Device / Other o Optional: One layer of unna paste to top of compression wrap (to act as an anchor). - He needs this to hold it up o Elevate, Exercise Daily and Avoid Standing for Long Periods of Time. o Elevate legs to the level of the heart and pump ankles as often as possible o Elevate leg(s) parallel to the floor when sitting. Wound Treatment Wound #1 - Lower Leg Wound Laterality: Left, Lateral Cleanser: Wound Cleanser 3 x Per Week/30 Days Discharge Instructions: Wash your hands with soap and water. Remove old dressing, discard into plastic bag and place into trash. Cleanse the wound with Wound Cleanser prior to applying a clean dressing using gauze sponges, not tissues or cotton balls. Do not scrub or use excessive force. Pat dry using gauze sponges, not tissue or cotton  balls. Peri-Wound Care: Desitin Maximum Strength Ointment 4 (oz) 3 x Per Week/30 Days Discharge Instructions: Apply around wound edges to avoid maceration Peri-Wound Care: Triamcinolone Acetonide Cream, 0.1%, 15 (g) tube 3 x Per Week/30 Days Discharge Instructions: Apply to leg Topical: Clobetasol Propionate ointment 0.05%, 60 (g) tube 3 x Per Week/30 Days Discharge Instructions: Pt to bring to appt- applied to wound bed Secondary Dressing: (NON-BORDER) Zetuvit Plus Silicone NON-BORDER 5x5 (in/in) 3 x Per Week/30 Days Compression Wrap: 3-LAYER WRAP - Profore Lite LF 3 Multilayer Compression Bandaging System 3 x Per Week/30 Days Discharge Instructions: Apply 3 multi-layer wrap as prescribed. Electronic Signature(s) Signed: 11/02/2021 3:32:08 PM By: Worthy Keeler PA-C Signed: 11/05/2021 3:03:11 PM By: Massie Kluver Entered By: Massie Kluver on 11/02/2021 08:33:37 [name] ([id number]) -------------------------------------------------------------------------------- SuperBill Details Patient Name: [name] Date of Service: 11/02/2021 Medical Record Number: [id number] Patient Account Number: [id number] Date of Birth/Sex: 01-17-1981 (40 y.o. 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

## 2021-11-06 NOTE — Progress Notes (Signed)
[name], [name] ([id number]) Visit Report for 11/02/2021 Arrival Information Details Patient Name: [name], [name] Date of Service: 11/02/2021 8:15 AM Medical Record Number: [id number] Patient Account Number: [id number] Date of Birth/Sex: 24-Dec-1980 (41 y.o. M) Treating RN: [name] Primary Care Baltasar Twilley: [name] Other Clinician: Massie Kluver Referring Morley Gaumer: [name] Treating Ralonda Tartt/Extender: [name] Cliche in Treatment: 42 Visit Information History Since Last Visit All ordered tests and consults were completed: No Patient Arrived: Ambulatory Added or deleted any medications: No Arrival Time: 08:10 Any new allergies or adverse reactions: No Transfer Assistance: None Had a fall or experienced change in No Patient Requires Transmission-Based No activities of daily living that may affect Precautions: risk of falls: Patient Has Alerts: Yes Hospitalized since last visit: No Patient Alerts: Patient has reaction Pain Present Now: No to silver dressings. Electronic Signature(s) Signed: 11/05/2021 3:03:11 PM By: Massie Kluver Entered By: Massie Kluver on 11/02/2021 08:31:11 [name] ([id number]) -------------------------------------------------------------------------------- Compression Therapy Details Patient Name: [name] Date of Service: 11/02/2021 8:15 AM Medical Record Number: [id number] Patient Account Number: [id number] Date of Birth/Sex: 08/31/1980 (40 y.o. M) Treating RN: [name] Primary Care Qunisha Bryk: [name] Other Clinician: Massie Kluver Referring Brooklinn Longbottom: [name] Treating Rhea Kaelin/Extender: [name] Cliche in Treatment: 256 Compression Therapy Performed for Wound Assessment: Wound #1 Left,Lateral Lower Leg Performed By: Lenice Pressman, Angie, Compression Type: Three Layer Pre Treatment ABI: 1.2 Electronic Signature(s) Signed: 11/05/2021 3:03:11 PM By: Massie Kluver Entered By: Massie Kluver on 11/02/2021  08:32:26 [name] ([id number]) -------------------------------------------------------------------------------- Encounter Discharge Information Details Patient Name: [name] Date of Service: 11/02/2021 8:15 AM Medical Record Number: [id number] Patient Account Number: [id number] Date of Birth/Sex: 13-Jul-1980 (40 y.o. M) Treating RN: [name] Primary Care Donivin Wirt: [name] Other Clinician: Massie Kluver Referring Jaymarie Yeakel: [name] Treating Julane Crock/Extender: [name] Cliche in Treatment: 256 Encounter Discharge Information Items Discharge Condition: Stable Ambulatory Status: Ambulatory Discharge Destination: Home Transportation: Private Auto Accompanied By: self Schedule Follow-up Appointment: Yes Clinical Summary of Care: Patient Declined Electronic Signature(s) Signed: 11/05/2021 3:03:11 PM By: Massie Kluver Entered By: Massie Kluver on 11/02/2021 [place], [name] ([id number]) -------------------------------------------------------------------------------- Wound Assessment Details Patient Name: [name] Date of Service: 11/02/2021 8:15 AM Medical Record Number: [id number] Patient Account Number: [id number] Date of Birth/Sex: [birth year] (40 y.o. M) Treating RN: [name] Primary Care Masaichi Kracht: [name] Other Clinician: Massie Kluver Referring Kmari Halter: [name] Treating Gene Glazebrook/Extender: [name] Cliche in Treatment: 256 Wound Status Wound Number: 1 Primary Etiology: Pyoderma Wound Location: Left, Lateral Lower Leg Wound Status: Open Wounding Event: Gradually Appeared Comorbid History: Sleep Apnea, Hypertension, Colitis Date Acquired: 11/18/2015 Weeks Of Treatment: 256 Clustered Wound: No Wound Measurements Length: (cm) 2.9 Width: (cm) 1.6 Depth: (cm) 0.4 Area: (cm) 3.644 Volume: (cm) 1.458 % Reduction in Area: 25.8% % Reduction in Volume: 62.9% Epithelialization: None Wound Description Classification:  Full Thickness With Exposed Support Structures Exudate Amount: Medium Exudate Type: Serosanguineous Exudate Color: red, brown Electronic Signature(s) Signed: 11/05/2021 3:03:11 PM By: Massie Kluver Signed: 11/06/2021 12:21:47 PM By: [name], [name], RN, [name], [name], [name] Entered By: Massie Kluver on 11/02/2021 08:31:40

## 2021-11-07 DIAGNOSIS — I872 Venous insufficiency (chronic) (peripheral): Secondary | ICD-10-CM | POA: Diagnosis not present

## 2021-11-07 NOTE — Progress Notes (Signed)
[name], [name] ([id number]) Visit Report for 11/05/2021 Physician Orders Details Patient Name: [name], [name] Date of Service: 11/05/2021 8:00 AM Medical Record Number: [id number] Patient Account Number: [id number] Date of Birth/Sex: January 03, 1981 (41 y.o. M) Treating RN: Levora Dredge Primary Care Provider: [name] Other Clinician: Referring Provider: [name] Treating Provider/Extender: [name] Cliche in Treatment: 256 Verbal / Phone Orders: No Diagnosis Coding Follow-up Appointments o Return Appointment in 1 week. o Nurse Visit as needed - twice a week Bathing/ Shower/ Hygiene o Clean wound with Normal Saline or wound cleanser. o May shower with wound dressing protected with water repellent cover or cast protector. o No tub bath. Cellular or Tissue Based Products Wound #1 Left,Lateral Lower Leg o Cellular or Tissue Based Product Type: - Apligraf #3 applied o Cellular or Tissue Based Product applied to wound bed; including contact layer, fixation with steri-strips, dry gauze and cover dressing. (DO NOT REMOVE). Edema Control - Lymphedema / Segmental Compressive Device / Other o Optional: One layer of unna paste to top of compression wrap (to act as an anchor). - He needs this to hold it up o Elevate, Exercise Daily and Avoid Standing for Long Periods of Time. o Elevate legs to the level of the heart and pump ankles as often as possible o Elevate leg(s) parallel to the floor when sitting. Wound Treatment Wound #1 - Lower Leg Wound Laterality: Left, Lateral Cleanser: Wound Cleanser 3 x Per Week/30 Days Discharge Instructions: Wash your hands with soap and water. Remove old dressing, discard into plastic bag and place into trash. Cleanse the wound with Wound Cleanser prior to applying a clean dressing using gauze sponges, not tissues or cotton balls. Do not scrub or use excessive force. Pat dry using gauze sponges, not tissue or cotton  balls. Peri-Wound Care: Desitin Maximum Strength Ointment 4 (oz) 3 x Per Week/30 Days Discharge Instructions: Apply around wound edges to avoid maceration Peri-Wound Care: Triamcinolone Acetonide Cream, 0.1%, 15 (g) tube 3 x Per Week/30 Days Discharge Instructions: Apply to leg Topical: Clobetasol Propionate ointment 0.05%, 60 (g) tube 3 x Per Week/30 Days Discharge Instructions: Pt to bring to appt- applied to wound bed Secondary Dressing: (NON-BORDER) Zetuvit Plus Silicone NON-BORDER 5x5 (in/in) 3 x Per Week/30 Days Compression Wrap: Medichoice 4 layer Compression System, 35-40 mmHG 3 x Per Week/30 Days Discharge Instructions: Apply multi-layer wrap as directed. Electronic Signature(s) Signed: 11/05/2021 9:45:10 AM By: Levora Dredge Signed: 11/07/2021 8:55:04 AM By: Worthy Keeler PA-C Entered By: Levora Dredge on 11/05/2021 09:45:09 [name], [name] ([id number]) -------------------------------------------------------------------------------- SuperBill Details Patient Name: [name] Date of Service: 11/05/2021 Medical Record Number: [id number] Patient Account Number: [id number] Date of Birth/Sex: 21-Nov-1980 (41 y.o. 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

## 2021-11-08 ENCOUNTER — Encounter: Payer: 59 | Admitting: Primary Care

## 2021-11-08 NOTE — Progress Notes (Signed)
[name], [name] ([id number]) Visit Report for 11/07/2021 Arrival Information Details Patient Name: [name], [name] Date of Service: 11/07/2021 8:00 AM Medical Record Number: [id number] Patient Account Number: [id number] Date of Birth/Sex: [birth year] (41 y.o. M) Treating RN: [name] Primary Care Rajan Burgard: [name] Other Clinician: Massie Kluver Referring Samarth Ogle: [name] Treating Tona [name]/Extender: Yaakov Guthrie in Treatment: 61 Visit Information History Since Last Visit All ordered tests and consults were completed: No Patient Arrived: Ambulatory Added or deleted any medications: No Arrival Time: 08:14 Any new allergies or adverse reactions: No Transfer Assistance: None Had a fall or experienced change in No Patient Requires Transmission-Based No activities of daily living that may affect Precautions: risk of falls: Patient Has Alerts: Yes Hospitalized since last visit: No Patient Alerts: Patient has reaction Pain Present Now: No to silver dressings. Electronic Signature(s) Signed: 11/08/2021 4:35:11 PM By: Massie Kluver Entered By: Massie Kluver on 11/07/2021 08:14:27 [name] ([id number]) -------------------------------------------------------------------------------- Clinic Level of Care Assessment Details Patient Name: [name] Date of Service: 11/07/2021 8:00 AM Medical Record Number: [id number] Patient Account Number: [id number] Date of Birth/Sex: 17-Dec-1980 (40 y.o. M) Treating RN: [name] Primary Care Nathanal Hermiz: [name] Other Clinician: Massie Kluver Referring [name] [name]: [name] Treating Yamilee Harmes/Extender: Yaakov Guthrie in Treatment: [facility] Level of Care Assessment Items TOOL 4 Quantity Score []  - Use when only an EandM is performed on FOLLOW-UP visit 0 ASSESSMENTS - Nursing Assessment / Reassessment X - Reassessment of Co-morbidities (includes updates in patient status) 1 10 X- 1 5 Reassessment  of Adherence to Treatment Plan ASSESSMENTS - Wound and Skin Assessment / Reassessment []  - Simple Wound Assessment / Reassessment - one wound 0 []  - 0 Complex Wound Assessment / Reassessment - multiple wounds []  - 0 Dermatologic / Skin Assessment (not related to wound area) ASSESSMENTS - Focused Assessment []  - Circumferential Edema Measurements - multi extremities 0 []  - 0 Nutritional Assessment / Counseling / Intervention []  - 0 Lower Extremity Assessment (monofilament, tuning fork, pulses) []  - 0 Peripheral Arterial Disease Assessment (using hand held doppler) ASSESSMENTS - Ostomy and/or Continence Assessment and Care []  - Incontinence Assessment and Management 0 []  - 0 Ostomy Care Assessment and Management (repouching, etc.) PROCESS - Coordination of Care X - Simple Patient / Family Education for ongoing care 1 15 []  - 0 Complex (extensive) Patient / Family Education for ongoing care []  - 0 Staff obtains Programmer, systems, Records, Test Results / Process Orders []  - 0 Staff telephones HHA, Nursing Homes / Clarify orders / etc []  - 0 Routine Transfer to another Facility (non-emergent condition) []  - 0 Routine Hospital Admission (non-emergent condition) []  - 0 New Admissions / Biomedical engineer / Ordering NPWT, Apligraf, etc. []  - 0 Emergency Hospital Admission (emergent condition) X- 1 10 Simple Discharge Coordination []  - 0 Complex (extensive) Discharge Coordination PROCESS - Special Needs []  - Pediatric / Minor Patient Management 0 []  - 0 Isolation Patient Management []  - 0 Hearing / Language / Visual special needs []  - 0 Assessment of Community assistance (transportation, D/C planning, etc.) []  - 0 Additional assistance / Altered mentation []  - 0 Support Surface(s) Assessment (bed, cushion, seat, etc.) INTERVENTIONS - Wound Cleansing / Measurement Baysinger, [name] ([id number]) []  - 0 Simple Wound Cleansing - one wound []  - 0 Complex Wound Cleansing - multiple  wounds []  - 0 Wound Imaging (photographs - any number of wounds) []  - 0 Wound Tracing (instead of photographs) []  - 0 Simple Wound Measurement - one wound []  - 0 Complex  Wound Measurement - multiple wounds INTERVENTIONS - Wound Dressings []  - Small Wound Dressing one or multiple wounds 0 []  - 0 Medium Wound Dressing one or multiple wounds X- 1 20 Large Wound Dressing one or multiple wounds X- 1 5 Application of Medications - topical []  - 0 Application of Medications - injection INTERVENTIONS - Miscellaneous []  - External ear exam 0 []  - 0 Specimen Collection (cultures, biopsies, blood, body fluids, etc.) []  - 0 Specimen(s) / Culture(s) sent or taken to Lab for analysis []  - 0 Patient Transfer (multiple staff / Civil Service fast streamer / Similar devices) []  - 0 Simple Staple / Suture removal (25 or less) []  - 0 Complex Staple / Suture removal (26 or more) []  - 0 Hypo / Hyperglycemic Management (close monitor of Blood Glucose) []  - 0 Ankle / Brachial Index (ABI) - do not check if billed separately []  - 0 Vital Signs Has the patient been seen at the hospital within the last three years: Yes Total Score: 65 Level Of Care: New/Established - Level 2 Electronic Signature(s) Signed: 11/08/2021 4:35:11 PM By: Massie Kluver Entered By: Massie Kluver on 11/07/2021 08:39:10 [name], [name] ([id number]) -------------------------------------------------------------------------------- Compression Therapy Details Patient Name: [name] Date of Service: 11/07/2021 8:00 AM Medical Record Number: [id number] Patient Account Number: [id number] Date of Birth/Sex: August 27, 1980 (40 y.o. M) Treating RN: [name] Primary Care Sejal Cofield: [name] Other Clinician: Massie Kluver Referring Harlan Vinal: [name] Treating Caily Rakers/Extender: Yaakov Guthrie in Treatment: 257 Compression Therapy Performed for Wound Assessment: Wound #1 Left,Lateral Lower Leg Performed By: [name], [name], Compression Type: Four Layer Pre Treatment ABI: 1.2 Electronic Signature(s) Signed: 11/08/2021 4:35:11 PM By: Massie Kluver Entered By: Massie Kluver on 11/07/2021 08:36:46 [name], [name] ([id number]) -------------------------------------------------------------------------------- Encounter Discharge Information Details Patient Name: [name] Date of Service: 11/07/2021 8:00 AM Medical Record Number: [id number] Patient Account Number: [id number] Date of Birth/Sex: [birth year] (40 y.o. M) Treating RN: [name] Primary Care Emersyn Kotarski: [name] Other Clinician: Massie Kluver Referring Tidus Upchurch: [name] Treating Ory Elting/Extender: Yaakov Guthrie in Treatment: 257 Encounter Discharge Information Items Discharge Condition: Stable Ambulatory Status: Ambulatory Discharge Destination: Home Transportation: Private Auto Accompanied By: self Schedule Follow-up Appointment: Yes Clinical Summary of Care: Electronic Signature(s) Signed: 11/08/2021 4:35:11 PM By: Massie Kluver Entered By: Massie Kluver on 11/07/2021 08:39:28 [name] ([id number]) -------------------------------------------------------------------------------- Wound Assessment Details Patient Name: [name] Date of Service: 11/07/2021 8:00 AM Medical Record Number: [id number] Patient Account Number: [id number] Date of Birth/Sex: March 10, 1981 (40 y.o. M) Treating RN: [name] Primary Care Mehdi Gironda: [name] Other Clinician: Massie Kluver Referring Sylvain Hasten: [name] Treating Ransom Nickson/Extender: Yaakov Guthrie in Treatment: 257 Wound Status Wound Number: 1 Primary Etiology: Pyoderma Wound Location: Left, Lateral Lower Leg Wound Status: Open Wounding Event: Gradually Appeared Comorbid History: Sleep Apnea, Hypertension, Colitis Date Acquired: 11/18/2015 Weeks Of Treatment: 257 Clustered Wound: No Wound Measurements Length: (cm) 2.9 Width: (cm)  1.6 Depth: (cm) 0.4 Area: (cm) 3.644 Volume: (cm) 1.458 % Reduction in Area: 25.8% % Reduction in Volume: 62.9% Epithelialization: None Wound Description Classification: Full Thickness With Exposed Support Structures Exudate Amount: Medium Exudate Type: Serosanguineous Exudate Color: red, brown Treatment Notes Wound #1 (Lower Leg) Wound Laterality: Left, Lateral Cleanser Wound Cleanser Discharge Instruction: Wash your hands with soap and water. Remove old dressing, discard into plastic bag and place into trash. Cleanse the wound with Wound Cleanser prior to applying a clean dressing using gauze sponges, not tissues or cotton balls. Do not scrub or use excessive force. Pat dry using  gauze sponges, not tissue or cotton balls. Peri-Wound Care Desitin Maximum Strength Ointment 4 (oz) Discharge Instruction: Apply around wound edges to avoid maceration Triamcinolone Acetonide Cream, 0.1%, 15 (g) tube Discharge Instruction: Apply to leg Topical Clobetasol Propionate ointment 0.05%, 60 (g) tube Discharge Instruction: Pt to bring to appt- applied to wound bed Primary Dressing Secondary Dressing (NON-BORDER) Zetuvit Plus Silicone NON-BORDER 5x5 (in/in) Secured With Compression Wrap Medichoice 4 layer Compression System, 35-40 mmHG Discharge Instruction: Apply multi-layer wrap as directed. Compression Stockings Add-Ons [name], [name] ([id number]) Electronic Signature(s) Signed: 11/08/2021 10:16:02 AM By: [name], [name], RN, [name], [name], [name] Signed: 11/08/2021 4:35:11 PM By: Massie Kluver Entered By: Massie Kluver on 11/07/2021 08:35:53

## 2021-11-09 ENCOUNTER — Encounter: Payer: 59 | Admitting: Physician Assistant

## 2021-11-09 ENCOUNTER — Telehealth: Payer: Self-pay

## 2021-11-09 DIAGNOSIS — I872 Venous insufficiency (chronic) (peripheral): Secondary | ICD-10-CM | POA: Diagnosis not present

## 2021-11-12 DIAGNOSIS — I872 Venous insufficiency (chronic) (peripheral): Secondary | ICD-10-CM | POA: Diagnosis not present

## 2021-11-12 NOTE — Telephone Encounter (Signed)
Received determination from prior authorization for Lantus Solos Inj 100/ML.  This medication or product is on your plan's list of covered drugs.  Prior authorization is not required at this time.  If your pharmacy has questions regarding the processing of your prescription, please have them call the [facility] pharmacy help desk at [phone].  Sent letter to scanning from [facility].

## 2021-11-14 DIAGNOSIS — I872 Venous insufficiency (chronic) (peripheral): Secondary | ICD-10-CM | POA: Diagnosis not present

## 2021-11-15 NOTE — Progress Notes (Signed)
[name], [name] ([id number]) Visit Report for 11/14/2021 Arrival Information Details Patient Name: [name] Date of Service: 11/14/2021 8:00 AM Medical Record Number: [id number] Patient Account Number: [id number] Date of Birth/Sex: 02-02-1981 (41 y.o. M) Treating RN: Levora Dredge Primary Care Shakoya Gilmore: [name] Other Clinician: Referring Brantlee Penn: [name] Treating Prisilla Kocsis/Extender: Yaakov Guthrie in Treatment: 8 Visit Information History Since Last Visit Added or deleted any medications: No Patient Arrived: Ambulatory Any new allergies or adverse reactions: No Arrival Time: 08:01 Had a fall or experienced change in No Accompanied By: self activities of daily living that may affect Transfer Assistance: None risk of falls: Patient Identification Verified: Yes Hospitalized since last visit: No Secondary Verification Process Completed: Yes Has Dressing in Place as Prescribed: Yes Patient Requires Transmission-Based No Has Compression in Place as Prescribed: Yes Precautions: Pain Present Now: No Patient Has Alerts: Yes Patient Alerts: Patient has reaction to silver dressings. Electronic Signature(s) Signed: 11/14/2021 4:35:40 PM By: Levora Dredge Entered By: Levora Dredge on 11/14/2021 08:03:01 [name], [name] ([id number]) -------------------------------------------------------------------------------- Clinic Level of Care Assessment Details Patient Name: [name], [name] Date of Service: 11/14/2021 8:00 AM Medical Record Number: [id number] Patient Account Number: [id number] Date of Birth/Sex: [birth year] (40 y.o. M) Treating RN: Levora Dredge Primary Care Hamed Debella: [name] Other Clinician: Referring Reginae Wolfrey: [name] Treating Teon Hudnall/Extender: Yaakov Guthrie in Treatment: 258 Clinic Level of Care Assessment Items TOOL 1 Quantity Score []  - Use when EandM and Procedure is performed on INITIAL visit 0 ASSESSMENTS - Nursing  Assessment / Reassessment []  - General Physical Exam (combine w/ comprehensive assessment (listed just below) when performed on new 0 pt. evals) []  - 0 Comprehensive Assessment (HX, ROS, Risk Assessments, Wounds Hx, etc.) ASSESSMENTS - Wound and Skin Assessment / Reassessment []  - Dermatologic / Skin Assessment (not related to wound area) 0 ASSESSMENTS - Ostomy and/or Continence Assessment and Care []  - Incontinence Assessment and Management 0 []  - 0 Ostomy Care Assessment and Management (repouching, etc.) PROCESS - Coordination of Care []  - Simple Patient / Family Education for ongoing care 0 []  - 0 Complex (extensive) Patient / Family Education for ongoing care []  - 0 Staff obtains Programmer, systems, Records, Test Results / Process Orders []  - 0 Staff telephones HHA, Nursing Homes / Clarify orders / etc []  - 0 Routine Transfer to another Facility (non-emergent condition) []  - 0 Routine Hospital Admission (non-emergent condition) []  - 0 New Admissions / Biomedical engineer / Ordering NPWT, Apligraf, etc. []  - 0 Emergency Hospital Admission (emergent condition) PROCESS - Special Needs []  - Pediatric / Minor Patient Management 0 []  - 0 Isolation Patient Management []  - 0 Hearing / Language / Visual special needs []  - 0 Assessment of Community assistance (transportation, D/C planning, etc.) []  - 0 Additional assistance / Altered mentation []  - 0 Support Surface(s) Assessment (bed, cushion, seat, etc.) INTERVENTIONS - Miscellaneous []  - External ear exam 0 []  - 0 Patient Transfer (multiple staff / Civil Service fast streamer / Similar devices) []  - 0 Simple Staple / Suture removal (25 or less) []  - 0 Complex Staple / Suture removal (26 or more) []  - 0 Hypo/Hyperglycemic Management (do not check if billed separately) []  - 0 Ankle / Brachial Index (ABI) - do not check if billed separately Has the patient been seen at the hospital within the last three years: Yes Total Score: 0 Level Of  Care: ____ [name] ([id number]) Electronic Signature(s) Signed: 11/14/2021 4:35:40 PM By: Levora Dredge Entered By: Levora Dredge on 11/14/2021 16:18:28 [name], [name] ([id number]) --------------------------------------------------------------------------------  Compression Therapy Details Patient Name: [name], [name] Date of Service: 11/14/2021 8:00 AM Medical Record Number: [id number] Patient Account Number: [id number] Date of Birth/Sex: 04-28-1981 (40 y.o. M) Treating RN: Levora Dredge Primary Care Areen Trautner: [name] Other Clinician: Referring Arleth Mccullar: [name] Treating Khady Vandenberg/Extender: Yaakov Guthrie in Treatment: 258 Compression Therapy Performed for Wound Assessment: Wound #1 Left,Lateral Lower Leg Performed By: Annie Paras, RN Compression Type: Four Layer Pre Treatment ABI: 1.2 Notes pt tolerated wrap without issue Electronic Signature(s) Signed: 11/14/2021 4:35:40 PM By: Levora Dredge Entered By: Levora Dredge on 11/14/2021 08:20:15 [name] ([id number]) -------------------------------------------------------------------------------- Encounter Discharge Information Details Patient Name: [name] Date of Service: 11/14/2021 8:00 AM Medical Record Number: [id number] Patient Account Number: [id number] Date of Birth/Sex: [birth year] (40 y.o. M) Treating RN: Levora Dredge Primary Care Sarvesh Meddaugh: [name] Other Clinician: Referring Otilia Kareem: [name] Treating Meris Reede/Extender: Yaakov Guthrie in Treatment: 258 Encounter Discharge Information Items Discharge Condition: Stable Ambulatory Status: Ambulatory Discharge Destination: Home Transportation: Private Auto Accompanied By: self Schedule Follow-up Appointment: Yes Clinical Summary of Care: Electronic Signature(s) Signed: 11/14/2021 4:18:20 PM By: Levora Dredge Entered By: Levora Dredge on 11/14/2021 16:18:20 [name]  ([id number]) -------------------------------------------------------------------------------- Wound Assessment Details Patient Name: [name] Date of Service: 11/14/2021 8:00 AM Medical Record Number: [id number] Patient Account Number: [id number] Date of Birth/Sex: Jan 31, 1981 (40 y.o. M) Treating RN: Levora Dredge Primary Care Fadel Clason: [name] Other Clinician: Referring Latyra Jaye: [name] Treating Dionna Wiedemann/Extender: Yaakov Guthrie in Treatment: 258 Wound Status Wound Number: 1 Primary Etiology: Pyoderma Wound Location: Left, Lateral Lower Leg Wound Status: Open Wounding Event: Gradually Appeared Comorbid History: Sleep Apnea, Hypertension, Colitis Date Acquired: 11/18/2015 Weeks Of Treatment: 258 Clustered Wound: No Wound Measurements Length: (cm) 3.4 Width: (cm) 2.8 Depth: (cm) 0.2 Area: (cm) 7.477 Volume: (cm) 1.495 % Reduction in Area: -52.3% % Reduction in Volume: 61.9% Epithelialization: None Wound Description Classification: Full Thickness With Exposed Support Structures Exudate Amount: Medium Exudate Type: Serosanguineous Exudate Color: red, brown Slough/Fibrino Yes Wound Bed Granulation Amount: Small (1-33%) Granulation Quality: Pink Necrotic Amount: Small (1-33%) Necrotic Quality: Adherent Slough Treatment Notes Wound #1 (Lower Leg) Wound Laterality: Left, Lateral Cleanser Wound Cleanser Discharge Instruction: Wash your hands with soap and water. Remove old dressing, discard into plastic bag and place into trash. Cleanse the wound with Wound Cleanser prior to applying a clean dressing using gauze sponges, not tissues or cotton balls. Do not scrub or use excessive force. Pat dry using gauze sponges, not tissue or cotton balls. Peri-Wound Care Desitin Maximum Strength Ointment 4 (oz) Discharge Instruction: Apply around wound edges to avoid maceration Triamcinolone Acetonide Cream, 0.1%, 15 (g) tube Discharge Instruction: Apply to  leg Topical Clobetasol Propionate ointment 0.05%, 60 (g) tube Discharge Instruction: Pt to bring to appt- applied to wound bed Primary Dressing Secondary Dressing (NON-BORDER) Zetuvit Plus Silicone NON-BORDER 5x5 (in/in) Secured With Compression Wrap Plush, Deuce ([id number]) Medichoice 4 layer Compression System, 35-40 mmHG Discharge Instruction: Apply multi-layer wrap as directed. Compression Stockings Add-Ons Electronic Signature(s) Signed: 11/14/2021 4:35:40 PM By: Levora Dredge Entered By: Levora Dredge on 11/14/2021 08:19:34

## 2021-11-16 DIAGNOSIS — I872 Venous insufficiency (chronic) (peripheral): Secondary | ICD-10-CM | POA: Diagnosis not present

## 2021-11-16 NOTE — Progress Notes (Signed)
EMIT, KUENZEL ([id number]) Visit Report for 11/16/2021 Physician Orders Details Patient Name: [name], [name] Date of Service: 11/16/2021 8:15 AM Medical Record Number: [id number] Patient Account Number: [id number] Date of Birth/Sex: [birth year] (41 y.o. M) Treating RN: [name] Primary Care Provider: [name] Other Clinician: Referring Provider: [name] Treating Provider/Extender: [name] Cliche in Treatment: [phone] Verbal / Phone Orders: No Diagnosis Coding Follow-up Appointments o Return Appointment in 1 week. o Nurse Visit as needed - twice a week Bathing/ Shower/ Hygiene o Clean wound with Normal Saline or wound cleanser. o May shower with wound dressing protected with water repellent cover or cast protector. o No tub bath. Cellular or Tissue Based Products Wound #1 Left,Lateral Lower Leg o Cellular or Tissue Based Product Type: - Apligraf #4 applied o Cellular or Tissue Based Product applied to wound bed; including contact layer, fixation with steri-strips, dry gauze and cover dressing. (DO NOT REMOVE). Edema Control - Lymphedema / Segmental Compressive Device / Other o Optional: One layer of unna paste to top of compression wrap (to act as an anchor). - He needs this to hold it up o Elevate, Exercise Daily and Avoid Standing for Long Periods of Time. o Elevate legs to the level of the heart and pump ankles as often as possible o Elevate leg(s) parallel to the floor when sitting. Wound Treatment Wound #1 - Lower Leg Wound Laterality: Left, Lateral Cleanser: Wound Cleanser 3 x Per Week/30 Days Discharge Instructions: Wash your hands with soap and water. Remove old dressing, discard into plastic bag and place into trash. Cleanse the wound with Wound Cleanser prior to applying a clean dressing using gauze sponges, not tissues or cotton balls. Do not scrub or use excessive force. Pat dry using gauze sponges, not tissue or cotton balls. Peri-Wound  Care: Desitin Maximum Strength Ointment 4 (oz) 3 x Per Week/30 Days Discharge Instructions: Apply around wound edges to avoid maceration Peri-Wound Care: Triamcinolone Acetonide Cream, 0.1%, 15 (g) tube 3 x Per Week/30 Days Discharge Instructions: Apply to leg Topical: Clobetasol Propionate ointment 0.05%, 60 (g) tube 3 x Per Week/30 Days Discharge Instructions: Pt to bring to appt- applied to wound bed Secondary Dressing: (NON-BORDER) Zetuvit Plus Silicone NON-BORDER 5x5 (in/in) 3 x Per Week/30 Days Compression Wrap: Medichoice 4 layer Compression System, 35-40 mmHG 3 x Per Week/30 Days Discharge Instructions: Apply multi-layer wrap as directed. Electronic Signature(s) Signed: 11/16/2021 4:25:13 PM By: Massie Kluver Signed: 11/16/2021 4:59:17 PM By: Worthy Keeler PA-C Entered By: Massie Kluver on 11/16/2021 08:14:20 [name], [name] ([id number]) -------------------------------------------------------------------------------- SuperBill Details Patient Name: [name] Date of Service: 11/16/2021 Medical Record Number: [id number] Patient Account Number: [id number] Date of Birth/Sex: 1980-07-22 (40 y.o. 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

## 2021-11-19 ENCOUNTER — Encounter: Payer: 59 | Attending: Physician Assistant

## 2021-11-19 DIAGNOSIS — L88 Pyoderma gangrenosum: Secondary | ICD-10-CM | POA: Diagnosis not present

## 2021-11-19 DIAGNOSIS — L97222 Non-pressure chronic ulcer of left calf with fat layer exposed: Secondary | ICD-10-CM | POA: Insufficient documentation

## 2021-11-19 DIAGNOSIS — I872 Venous insufficiency (chronic) (peripheral): Secondary | ICD-10-CM | POA: Insufficient documentation

## 2021-11-19 DIAGNOSIS — F17200 Nicotine dependence, unspecified, uncomplicated: Secondary | ICD-10-CM | POA: Insufficient documentation

## 2021-11-19 DIAGNOSIS — E11622 Type 2 diabetes mellitus with other skin ulcer: Secondary | ICD-10-CM | POA: Insufficient documentation

## 2021-11-19 DIAGNOSIS — Z452 Encounter for adjustment and management of vascular access device: Secondary | ICD-10-CM | POA: Diagnosis not present

## 2021-11-19 DIAGNOSIS — Z7985 Long-term (current) use of injectable non-insulin antidiabetic drugs: Secondary | ICD-10-CM | POA: Insufficient documentation

## 2021-11-19 DIAGNOSIS — Z794 Long term (current) use of insulin: Secondary | ICD-10-CM | POA: Diagnosis not present

## 2021-11-19 DIAGNOSIS — E669 Obesity, unspecified: Secondary | ICD-10-CM | POA: Insufficient documentation

## 2021-11-19 NOTE — Progress Notes (Signed)
[name] ([id number]) Visit Report for 11/16/2021 Arrival Information Details Patient Name: [name], [name] Date of Service: 11/16/2021 8:15 AM Medical Record Number: [id number] Patient Account Number: [id number] Date of Birth/Sex: Oct 04, 1980 (42 y.o. M) Treating RN: [name] Primary Care [name]: [name] Other Clinician: Referring [name]: [name] Treating [name]/Extender: [name] in Treatment: 104 Visit Information History Since Last Visit All ordered tests and consults were completed: No Patient Arrived: Ambulatory Added or deleted any medications: No Arrival Time: 08:12 Any new allergies or adverse reactions: No Transfer Assistance: None Had a fall or experienced change in No Patient Requires Transmission-Based No activities of daily living that may affect Precautions: risk of falls: Patient Has Alerts: Yes Hospitalized since last visit: No Patient Alerts: Patient has reaction Pain Present Now: No to silver dressings. Electronic Signature(s) Signed: 11/16/2021 4:25:13 PM By: [name] Entered By: [name] on 11/16/2021 08:13:02 [name] ([id number]) -------------------------------------------------------------------------------- Clinic Level of Care Assessment Details Patient Name: [name] Date of Service: 11/16/2021 8:15 AM Medical Record Number: [id number] Patient Account Number: [id number] Date of Birth/Sex: 07-02-1980 (41 y.o. M) Treating RN: [name] Primary Care Ajanay Farve: [name] Other Clinician: Massie [name] Referring Zahir Eisenhour: [name] Treating [name]/Extender: [name] in Treatment: 258 Clinic Level of Care Assessment Items TOOL 1 Quantity Score []  - Use when EandM and Procedure is performed on INITIAL visit 0 ASSESSMENTS - Nursing Assessment / Reassessment []  - General Physical Exam (combine w/ comprehensive assessment (listed just below) when performed on new 0 pt.  evals) []  - 0 Comprehensive Assessment (HX, ROS, Risk Assessments, Wounds Hx, etc.) ASSESSMENTS - Wound and Skin Assessment / Reassessment []  - Dermatologic / Skin Assessment (not related to wound area) 0 ASSESSMENTS - Ostomy and/or Continence Assessment and Care []  - Incontinence Assessment and Management 0 []  - 0 Ostomy Care Assessment and Management (repouching, etc.) PROCESS - Coordination of Care []  - Simple Patient / Family Education for ongoing care 0 []  - 0 Complex (extensive) Patient / Family Education for ongoing care []  - 0 Staff obtains Programmer, systems, Records, Test Results / Process Orders []  - 0 Staff telephones HHA, Nursing Homes / Clarify orders / etc []  - 0 Routine Transfer to another Facility (non-emergent condition) []  - 0 Routine Hospital Admission (non-emergent condition) []  - 0 New Admissions / Biomedical engineer / Ordering NPWT, Apligraf, etc. []  - 0 Emergency Hospital Admission (emergent condition) PROCESS - Special Needs []  - Pediatric / Minor Patient Management 0 []  - 0 Isolation Patient Management []  - 0 Hearing / Language / Visual special needs []  - 0 Assessment of Community assistance (transportation, D/C planning, etc.) []  - 0 Additional assistance / Altered mentation []  - 0 Support Surface(s) Assessment (bed, cushion, seat, etc.) INTERVENTIONS - Miscellaneous []  - External ear exam 0 []  - 0 Patient Transfer (multiple staff / Civil Service fast streamer / Similar devices) []  - 0 Simple Staple / Suture removal (25 or less) []  - 0 Complex Staple / Suture removal (26 or more) []  - 0 Hypo/Hyperglycemic Management (do not check if billed separately) []  - 0 Ankle / Brachial Index (ABI) - do not check if billed separately Has the patient been seen at the hospital within the last three years: Yes Total Score: 0 Level Of Care: ____ [name] ([id number]) Electronic Signature(s) Signed: 11/16/2021 4:25:13 PM By: [name] Entered By: [name] on  11/16/2021 08:39:26 [name] ([id number]) -------------------------------------------------------------------------------- Encounter Discharge Information Details Patient Name: [name] Date of Service: 11/16/2021 8:15 AM Medical Record  Number: [id number] Patient Account Number: [id number] Date of Birth/Sex: 08-22-1980 (40 y.o. M) Treating RN: [name] Primary Care Khadir Roam: [name] Other Clinician: Massie [name] Referring [name]: [name] Treating [name]/Extender: [name] in Treatment: 258 Encounter Discharge Information Items Discharge Condition: Stable Ambulatory Status: Ambulatory Discharge Destination: Home Transportation: Private Auto Accompanied By: self Schedule Follow-up Appointment: Yes Clinical Summary of Care: Electronic Signature(s) Signed: 11/16/2021 4:25:13 PM By: [name] Entered By: [name] on 11/16/2021 08:39:17 [name] ([id number]) -------------------------------------------------------------------------------- Wound Assessment Details Patient Name: [name] Date of Service: 11/16/2021 8:15 AM Medical Record Number: [id number] Patient Account Number: [id number] Date of Birth/Sex: May 08, 1981 (40 y.o. M) Treating RN: [name] Primary Care Dontavia Brand: [name] Other Clinician: Referring [name]: [name] Treating Keyia Moretto/Extender: [name] in Treatment: 258 Wound Status Wound Number: 1 Primary Etiology: Pyoderma Wound Location: Left, Lateral Lower Leg Wound Status: Open Wounding Event: Gradually Appeared Comorbid History: Sleep Apnea, Hypertension, Colitis Date Acquired: 11/18/2015 Weeks Of Treatment: 258 Clustered Wound: No Wound Measurements Length: (cm) 3.4 Width: (cm) 2.8 Depth: (cm) 0.2 Area: (cm) 7.477 Volume: (cm) 1.495 % Reduction in Area: -52.3% % Reduction in Volume: 61.9% Epithelialization: None Wound Description Classification: Full Thickness With  Exposed Support Structures Exudate Amount: Medium Exudate Type: Serosanguineous Exudate Color: red, brown Slough/Fibrino Yes Wound Bed Granulation Amount: Small (1-33%) Granulation Quality: Pink Necrotic Amount: Small (1-33%) Necrotic Quality: Adherent Therapist, music) Signed: 11/16/2021 4:25:13 PM By: [name] Signed: 11/19/2021 2:38:00 PM By: [name], [name], RN, [name], [name], [name] Entered By: [name] on 11/16/2021 05:11:02

## 2021-11-21 DIAGNOSIS — I872 Venous insufficiency (chronic) (peripheral): Secondary | ICD-10-CM | POA: Diagnosis not present

## 2021-11-21 NOTE — Progress Notes (Signed)
TYLLER, BOWLBY ([id number]) Visit Report for 11/21/2021 Physician Orders Details Patient Name: [name], [name] Date of Service: 11/21/2021 8:00 AM Medical Record Number: [id number] Patient Account Number: [id number] Date of Birth/Sex: 05-Nov-1980 (41 y.o. M) Treating RN: Levora Dredge Primary Care Provider: [name] Other Clinician: Referring Provider: [name] Treating Provider/Extender: Yaakov Guthrie in Treatment: [phone] Verbal / Phone Orders: No Diagnosis Coding Follow-up Appointments o Return Appointment in 1 week. o Nurse Visit as needed - twice a week Bathing/ Shower/ Hygiene o Clean wound with Normal Saline or wound cleanser. o May shower with wound dressing protected with water repellent cover or cast protector. o No tub bath. Cellular or Tissue Based Products Wound #1 Left,Lateral Lower Leg o Cellular or Tissue Based Product Type: - Apligraf #4 applied o Cellular or Tissue Based Product applied to wound bed; including contact layer, fixation with steri-strips, dry gauze and cover dressing. (DO NOT REMOVE). Edema Control - Lymphedema / Segmental Compressive Device / Other o Optional: One layer of unna paste to top of compression wrap (to act as an anchor). - He needs this to hold it up o Elevate, Exercise Daily and Avoid Standing for Long Periods of Time. o Elevate legs to the level of the heart and pump ankles as often as possible o Elevate leg(s) parallel to the floor when sitting. Wound Treatment Wound #1 - Lower Leg Wound Laterality: Left, Lateral Cleanser: Wound Cleanser 3 x Per Week/30 Days Discharge Instructions: Wash your hands with soap and water. Remove old dressing, discard into plastic bag and place into trash. Cleanse the wound with Wound Cleanser prior to applying a clean dressing using gauze sponges, not tissues or cotton balls. Do not scrub or use excessive force. Pat dry using gauze sponges, not tissue or cotton  balls. Peri-Wound Care: Desitin Maximum Strength Ointment 4 (oz) 3 x Per Week/30 Days Discharge Instructions: Apply around wound edges to avoid maceration Peri-Wound Care: Triamcinolone Acetonide Cream, 0.1%, 15 (g) tube 3 x Per Week/30 Days Discharge Instructions: Apply to leg Topical: Clobetasol Propionate ointment 0.05%, 60 (g) tube 3 x Per Week/30 Days Discharge Instructions: Pt to bring to appt- applied to wound bed Secondary Dressing: (NON-BORDER) Zetuvit Plus Silicone NON-BORDER 5x5 (in/in) 3 x Per Week/30 Days Compression Wrap: Medichoice 4 layer Compression System, 35-40 mmHG 3 x Per Week/30 Days Discharge Instructions: Apply multi-layer wrap as directed. Electronic Signature(s) Signed: 11/21/2021 9:15:06 AM By: Kalman Shan DO Signed: 11/21/2021 4:13:21 PM By: Levora Dredge Entered By: Levora Dredge on 11/21/2021 08:22:47 EASTON, FETTY ([id number]) -------------------------------------------------------------------------------- SuperBill Details Patient Name: [name] Date of Service: 11/21/2021 Medical Record Number: [id number] Patient Account Number: [id number] Date of Birth/Sex: [birth year] (41 y.o. 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

## 2021-11-21 NOTE — Progress Notes (Signed)
[name], [name] ([id number]) Visit Report for 11/21/2021 Arrival Information Details Patient Name: [name], [name] Date of Service: 11/21/2021 8:00 AM Medical Record Number: [id number] Patient Account Number: [id number] Date of Birth/Sex: 1980-07-09 (41 y.o. M) Treating RN: Levora Dredge Primary Care Shekita Boyden: [name] Other Clinician: Referring Brinn Westby: [name] Treating Bridget Westbrooks/Extender: Yaakov Guthrie in Treatment: 39 Visit Information History Since Last Visit Added or deleted any medications: No Patient Arrived: Ambulatory Any new allergies or adverse reactions: No Arrival Time: 08:21 Had a fall or experienced change in No Accompanied By: self activities of daily living that may affect Transfer Assistance: None risk of falls: Patient Identification Verified: Yes Hospitalized since last visit: No Secondary Verification Process Completed: Yes Has Dressing in Place as Prescribed: Yes Patient Requires Transmission-Based No Has Compression in Place as Prescribed: Yes Precautions: Pain Present Now: No Patient Has Alerts: Yes Patient Alerts: Patient has reaction to silver dressings. Electronic Signature(s) Signed: 11/21/2021 4:13:21 PM By: Levora Dredge Entered By: Levora Dredge on 11/21/2021 08:21:48 [name] ([id number]) -------------------------------------------------------------------------------- Clinic Level of Care Assessment Details Patient Name: [name] Date of Service: 11/21/2021 8:00 AM Medical Record Number: [id number] Patient Account Number: [id number] Date of Birth/Sex: [birth year] (40 y.o. M) Treating RN: Levora Dredge Primary Care Nalin Mazzocco: [name] Other Clinician: Referring Thane Age: [name] Treating Ademide Schaberg/Extender: Yaakov Guthrie in Treatment: 259 Clinic Level of Care Assessment Items TOOL 1 Quantity Score []  - Use when EandM and Procedure is performed on INITIAL visit 0 ASSESSMENTS - Nursing  Assessment / Reassessment []  - General Physical Exam (combine w/ comprehensive assessment (listed just below) when performed on new 0 pt. evals) []  - 0 Comprehensive Assessment (HX, ROS, Risk Assessments, Wounds Hx, etc.) ASSESSMENTS - Wound and Skin Assessment / Reassessment []  - Dermatologic / Skin Assessment (not related to wound area) 0 ASSESSMENTS - Ostomy and/or Continence Assessment and Care []  - Incontinence Assessment and Management 0 []  - 0 Ostomy Care Assessment and Management (repouching, etc.) PROCESS - Coordination of Care []  - Simple Patient / Family Education for ongoing care 0 []  - 0 Complex (extensive) Patient / Family Education for ongoing care []  - 0 Staff obtains Programmer, systems, Records, Test Results / Process Orders []  - 0 Staff telephones HHA, Nursing Homes / Clarify orders / etc []  - 0 Routine Transfer to another Facility (non-emergent condition) []  - 0 Routine Hospital Admission (non-emergent condition) []  - 0 New Admissions / Biomedical engineer / Ordering NPWT, Apligraf, etc. []  - 0 Emergency Hospital Admission (emergent condition) PROCESS - Special Needs []  - Pediatric / Minor Patient Management 0 []  - 0 Isolation Patient Management []  - 0 Hearing / Language / Visual special needs []  - 0 Assessment of Community assistance (transportation, D/C planning, etc.) []  - 0 Additional assistance / Altered mentation []  - 0 Support Surface(s) Assessment (bed, cushion, seat, etc.) INTERVENTIONS - Miscellaneous []  - External ear exam 0 []  - 0 Patient Transfer (multiple staff / Civil Service fast streamer / Similar devices) []  - 0 Simple Staple / Suture removal (25 or less) []  - 0 Complex Staple / Suture removal (26 or more) []  - 0 Hypo/Hyperglycemic Management (do not check if billed separately) []  - 0 Ankle / Brachial Index (ABI) - do not check if billed separately Has the patient been seen at the hospital within the last three years: Yes Total Score: 0 Level Of  Care: ____ [name] ([id number]) Electronic Signature(s) Signed: 11/21/2021 4:13:21 PM By: Levora Dredge Entered By: Levora Dredge on 11/21/2021 08:23:20 Vivas, [name] ([id number]) --------------------------------------------------------------------------------  Compression Therapy Details Patient Name: [name], [name] Date of Service: 11/21/2021 8:00 AM Medical Record Number: [id number] Patient Account Number: [id number] Date of Birth/Sex: 1980-05-21 (40 y.o. M) Treating RN: Levora Dredge Primary Care Keiden Deskin: [name] Other Clinician: Referring Benjamin Casanas: [name] Treating Gurdeep Keesey/Extender: Yaakov Guthrie in Treatment: 259 Compression Therapy Performed for Wound Assessment: Wound #1 Left,Lateral Lower Leg Performed By: Annie Paras, RN Compression Type: Four Layer Pre Treatment ABI: 1.2 Notes pt tolerated wrap without issue Electronic Signature(s) Signed: 11/21/2021 4:13:21 PM By: Levora Dredge Entered By: Levora Dredge on 11/21/2021 08:22:31 JARYN, ROSKO ([id number]) -------------------------------------------------------------------------------- Encounter Discharge Information Details Patient Name: [name] Date of Service: 11/21/2021 8:00 AM Medical Record Number: [id number] Patient Account Number: [id number] Date of Birth/Sex: 01-Oct-1980 (40 y.o. M) Treating RN: Levora Dredge Primary Care Rozell Kettlewell: [name] Other Clinician: Referring Keontae Levingston: [name] Treating Novah Goza/Extender: Yaakov Guthrie in Treatment: 45 Encounter Discharge Information Items Discharge Condition: Stable Ambulatory Status: Ambulatory Discharge Destination: Home Transportation: Private Auto Accompanied By: self Schedule Follow-up Appointment: Yes Clinical Summary of Care: Electronic Signature(s) Signed: 11/21/2021 4:13:21 PM By: Levora Dredge Entered By: Levora Dredge on 11/21/2021 08:23:11 EMIR, NACK  ([id number]) -------------------------------------------------------------------------------- Wound Assessment Details Patient Name: [name] Date of Service: 11/21/2021 8:00 AM Medical Record Number: [id number] Patient Account Number: [id number] Date of Birth/Sex: 08-01-1980 (40 y.o. M) Treating RN: Levora Dredge Primary Care Voncile Schwarz: [name] Other Clinician: Referring Ariday Brinker: [name] Treating Loranda Mastel/Extender: Yaakov Guthrie in Treatment: 259 Wound Status Wound Number: 1 Primary Etiology: Pyoderma Wound Location: Left, Lateral Lower Leg Wound Status: Open Wounding Event: Gradually Appeared Comorbid History: Sleep Apnea, Hypertension, Colitis Date Acquired: 11/18/2015 Weeks Of Treatment: 259 Clustered Wound: No Wound Measurements Length: (cm) 3.4 Width: (cm) 2.8 Depth: (cm) 0.2 Area: (cm) 7.477 Volume: (cm) 1.495 % Reduction in Area: -52.3% % Reduction in Volume: 61.9% Epithelialization: None Wound Description Classification: Full Thickness With Exposed Support Structures Exudate Amount: Medium Exudate Type: Serosanguineous Exudate Color: red, brown Slough/Fibrino Yes Wound Bed Granulation Amount: Small (1-33%) Granulation Quality: Pink Necrotic Amount: Small (1-33%) Necrotic Quality: Adherent Slough Treatment Notes Wound #1 (Lower Leg) Wound Laterality: Left, Lateral Cleanser Wound Cleanser Discharge Instruction: Wash your hands with soap and water. Remove old dressing, discard into plastic bag and place into trash. Cleanse the wound with Wound Cleanser prior to applying a clean dressing using gauze sponges, not tissues or cotton balls. Do not scrub or use excessive force. Pat dry using gauze sponges, not tissue or cotton balls. Peri-Wound Care Desitin Maximum Strength Ointment 4 (oz) Discharge Instruction: Apply around wound edges to avoid maceration Triamcinolone Acetonide Cream, 0.1%, 15 (g) tube Discharge Instruction: Apply to  leg Topical Clobetasol Propionate ointment 0.05%, 60 (g) tube Discharge Instruction: Pt to bring to appt- applied to wound bed Primary Dressing Secondary Dressing (NON-BORDER) Zetuvit Plus Silicone NON-BORDER 5x5 (in/in) Secured With Compression Wrap Adriance, Jerrard ([id number]) Medichoice 4 layer Compression System, 35-40 mmHG Discharge Instruction: Apply multi-layer wrap as directed. Compression Stockings Add-Ons Electronic Signature(s) Signed: 11/21/2021 4:13:21 PM By: Levora Dredge Entered By: Levora Dredge on 11/21/2021 08:22:07

## 2021-11-22 ENCOUNTER — Ambulatory Visit (INDEPENDENT_AMBULATORY_CARE_PROVIDER_SITE_OTHER): Payer: 59 | Admitting: Primary Care

## 2021-11-22 ENCOUNTER — Encounter: Payer: Self-pay | Admitting: Primary Care

## 2021-11-22 ENCOUNTER — Other Ambulatory Visit: Payer: Self-pay | Admitting: Primary Care

## 2021-11-22 VITALS — BP 126/82 | HR 87 | Temp 98.6°F | Ht 71.0 in | Wt 359.0 lb

## 2021-11-22 DIAGNOSIS — I10 Essential (primary) hypertension: Secondary | ICD-10-CM | POA: Diagnosis not present

## 2021-11-22 DIAGNOSIS — Z794 Long term (current) use of insulin: Secondary | ICD-10-CM

## 2021-11-22 DIAGNOSIS — Z1159 Encounter for screening for other viral diseases: Secondary | ICD-10-CM

## 2021-11-22 DIAGNOSIS — L88 Pyoderma gangrenosum: Secondary | ICD-10-CM

## 2021-11-22 DIAGNOSIS — K5190 Ulcerative colitis, unspecified, without complications: Secondary | ICD-10-CM

## 2021-11-22 DIAGNOSIS — E1165 Type 2 diabetes mellitus with hyperglycemia: Secondary | ICD-10-CM | POA: Diagnosis not present

## 2021-11-22 DIAGNOSIS — Z0001 Encounter for general adult medical examination with abnormal findings: Secondary | ICD-10-CM

## 2021-11-22 DIAGNOSIS — N644 Mastodynia: Secondary | ICD-10-CM

## 2021-11-22 DIAGNOSIS — E785 Hyperlipidemia, unspecified: Secondary | ICD-10-CM | POA: Diagnosis not present

## 2021-11-22 DIAGNOSIS — Z720 Tobacco use: Secondary | ICD-10-CM

## 2021-11-22 DIAGNOSIS — J302 Other seasonal allergic rhinitis: Secondary | ICD-10-CM | POA: Diagnosis not present

## 2021-11-22 DIAGNOSIS — F3342 Major depressive disorder, recurrent, in full remission: Secondary | ICD-10-CM

## 2021-11-22 DIAGNOSIS — Z0000 Encounter for general adult medical examination without abnormal findings: Secondary | ICD-10-CM | POA: Insufficient documentation

## 2021-11-22 HISTORY — DX: Mastodynia: N64.4

## 2021-11-22 LAB — COMPREHENSIVE METABOLIC PANEL
ALT: 75 U/L — ABNORMAL HIGH (ref 0–53)
AST: 40 U/L — ABNORMAL HIGH (ref 0–37)
Albumin: 4.4 g/dL (ref 3.5–5.2)
Alkaline Phosphatase: 112 U/L (ref 39–117)
BUN: 13 mg/dL (ref 6–23)
CO2: 28 mEq/L (ref 19–32)
Calcium: 9.1 mg/dL (ref 8.4–10.5)
Chloride: 99 mEq/L (ref 96–112)
Creatinine, Ser: 0.94 mg/dL (ref 0.40–1.50)
GFR: 99.76 mL/min (ref >60.00)
Glucose, Bld: 129 mg/dL — ABNORMAL HIGH (ref 70–99)
Potassium: 4.1 mEq/L (ref 3.5–5.1)
Sodium: 136 mEq/L (ref 135–145)
Total Bilirubin: 0.6 mg/dL (ref 0.2–1.2)
Total Protein: 7.1 g/dL (ref 6.0–8.3)

## 2021-11-22 LAB — LIPID PANEL
Cholesterol: 130 mg/dL (ref 0–200)
HDL: 49.7 mg/dL (ref >39.00)
LDL Cholesterol: 53 mg/dL (ref 0–99)
NonHDL: 80.21
Total CHOL/HDL Ratio: 3
Triglycerides: 137 mg/dL (ref 0.0–149.0)
VLDL: 27.4 mg/dL (ref 0.0–40.0)

## 2021-11-22 LAB — MICROALBUMIN / CREATININE URINE RATIO
Creatinine,U: 205.6 mg/dL
Microalb Creat Ratio: 0.7 mg/g (ref 0.0–30.0)
Microalb, Ur: 1.3 mg/dL (ref 0.0–1.9)

## 2021-11-22 LAB — HEMOGLOBIN A1C: Hgb A1c MFr Bld: 7.6 % — ABNORMAL HIGH (ref 4.6–6.5)

## 2021-11-22 NOTE — Assessment & Plan Note (Signed)
Glucose readings ranging in the low 100's, dipping to high 60's once weekly. Repeat A1C pending.  Continue Trulicity 1.5 mg weekly. Continue metformin 1000 mg once daily, he has been doing this for the last 3+ months.  Continue Lantus 35 units daily.  Foot exam today. Managed on statin.  Urine microalbumin due and pending. Patient declines pneumonia vaccine.  Follow-up in 3 to 6 months based on A1c result.

## 2021-11-22 NOTE — Progress Notes (Signed)
TIP, ATIENZA ([id number]) Visit Report for 11/19/2021 Physician Orders Details Patient Name: [name], [name] Date of Service: 11/19/2021 8:00 AM Medical Record Number: [id number] Patient Account Number: [id number] Date of Birth/Sex: [birth year] (41 y.o. M) Treating RN: [name] Primary Care Provider: [name] Other Clinician: Massie Kluver Referring Provider: [name] Treating Provider/Extender: [name] Cliche in Treatment: [phone] Verbal / Phone Orders: No Diagnosis Coding Follow-up Appointments o Return Appointment in 1 week. o Nurse Visit as needed - twice a week Bathing/ Shower/ Hygiene o Clean wound with Normal Saline or wound cleanser. o May shower with wound dressing protected with water repellent cover or cast protector. o No tub bath. Cellular or Tissue Based Products Wound #1 Left,Lateral Lower Leg o Cellular or Tissue Based Product Type: - Apligraf #4 applied o Cellular or Tissue Based Product applied to wound bed; including contact layer, fixation with steri-strips, dry gauze and cover dressing. (DO NOT REMOVE). Edema Control - Lymphedema / Segmental Compressive Device / Other o Optional: One layer of unna paste to top of compression wrap (to act as an anchor). - He needs this to hold it up o Elevate, Exercise Daily and Avoid Standing for Long Periods of Time. o Elevate legs to the level of the heart and pump ankles as often as possible o Elevate leg(s) parallel to the floor when sitting. Wound Treatment Wound #1 - Lower Leg Wound Laterality: Left, Lateral Cleanser: Wound Cleanser 3 x Per Week/30 Days Discharge Instructions: Wash your hands with soap and water. Remove old dressing, discard into plastic bag and place into trash. Cleanse the wound with Wound Cleanser prior to applying a clean dressing using gauze sponges, not tissues or cotton balls. Do not scrub or use excessive force. Pat dry using gauze sponges, not tissue or cotton  balls. Peri-Wound Care: Desitin Maximum Strength Ointment 4 (oz) 3 x Per Week/30 Days Discharge Instructions: Apply around wound edges to avoid maceration Peri-Wound Care: Triamcinolone Acetonide Cream, 0.1%, 15 (g) tube 3 x Per Week/30 Days Discharge Instructions: Apply to leg Topical: Clobetasol Propionate ointment 0.05%, 60 (g) tube 3 x Per Week/30 Days Discharge Instructions: Pt to bring to appt- applied to wound bed Secondary Dressing: (NON-BORDER) Zetuvit Plus Silicone NON-BORDER 5x5 (in/in) 3 x Per Week/30 Days Compression Wrap: Medichoice 4 layer Compression System, 35-40 mmHG 3 x Per Week/30 Days Discharge Instructions: Apply multi-layer wrap as directed. Electronic Signature(s) Signed: 11/19/2021 3:51:56 PM By: Worthy Keeler PA-C Signed: 11/22/2021 4:13:59 PM By: Massie Kluver Entered By: Massie Kluver on 11/19/2021 08:32:16 Lamay, [name] ([id number]) -------------------------------------------------------------------------------- SuperBill Details Patient Name: [name] Date of Service: 11/19/2021 Medical Record Number: [id number] Patient Account Number: [id number] Date of Birth/Sex: [birth year] (40 y.o. 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

## 2021-11-22 NOTE — Assessment & Plan Note (Signed)
Unclear etiology. No lump/skin structure changes. He is tender on exam.  Orders placed for right breast ultrasound for further evaluation.  No family history of breast cancer.

## 2021-11-22 NOTE — Assessment & Plan Note (Signed)
Due for pneumonia vaccine, patient declines. Tetanus UTD.  Discussed the importance of a healthy diet and regular exercise in order for weight loss, and to reduce the risk of further co-morbidity.  Exam today as noted. Labs pending.

## 2021-11-22 NOTE — Assessment & Plan Note (Signed)
Following with [facility] dermatology, improving.  Continue Remicade 100 mg once weekly

## 2021-11-22 NOTE — Assessment & Plan Note (Signed)
In remission.  Follows with GI annually.

## 2021-11-22 NOTE — Assessment & Plan Note (Signed)
Stable.  Continue daily antihistamine.

## 2021-11-22 NOTE — Progress Notes (Signed)
Subjective:    Patient ID: [name], male    DOB: December 24, 1980, 41 y.o.   MRN: [id number]  HPI  [name] is a very pleasant 41 y.o. male who presents today for complete physical and follow up of chronic conditions.  He would also like to mention right breast tenderness, located to the right nipple area. No pain with walking or resting, only with palpation or laying onto his right side. He's not noticed lumps, color changes, fevers, or swelling. He denies a family history of breast cancer.   Immunizations: -Tetanus: 2020 -Influenza: Did not complete last season  -Covid-19: Has not completed -Pneumonia: Never completed, declines   Diet: Fair diet.  Exercise: No regular exercise. Active at work.   Eye exam: Completes annually  Dental exam: Completes semi-annually   BP Readings from Last 3 Encounters:  11/22/21 126/82  07/31/21 130/74  06/22/21 138/64        Review of Systems  Constitutional:  Negative for unexpected weight change.  HENT:  Negative for rhinorrhea.   Respiratory:  Negative for cough and shortness of breath.   Cardiovascular:  Negative for chest pain.       Right breast pain  Gastrointestinal:  Negative for constipation and diarrhea.  Genitourinary:  Negative for difficulty urinating.  Musculoskeletal:  Negative for arthralgias and myalgias.  Skin:  Negative for rash.  Allergic/Immunologic: Negative for environmental allergies.  Neurological:  Negative for dizziness, numbness and headaches.  Psychiatric/Behavioral:  The patient is not nervous/anxious.          Past Medical History:  Diagnosis Date   Blood in stool    Depression    Elevated blood pressure    Hyperlipidemia    OSA (obstructive sleep apnea) 2014   Seasonal allergies    Ulcerative colitis ([place]) 03/05/2018    Social History   Socioeconomic History   Marital status: Married    Spouse name: Not on file   Number of children: Not on file   Years of education: Not on file    Highest education level: Not on file  Occupational History   Not on file  Tobacco Use   Smoking status: Every Day    Packs/day: 1.00    Years: 14.00    Total pack years: 14.00    Types: Cigarettes    Last attempt to quit: 11/25/2018    Years since quitting: 2.9   Smokeless tobacco: Former    Quit date: 03/17/2015  Vaping Use   Vaping Use: Never used  Substance and Sexual Activity   Alcohol use: Not Currently    Alcohol/week: 0.0 standard drinks of alcohol    Comment: soical   Drug use: Not on file   Sexual activity: Not on file  Other Topics Concern   Not on file  Social History Narrative   Married.   7 kids.   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   Enjoys target shooting.    Social Determinants of Health   Financial Resource Strain: Not on file  Food Insecurity: Not on file  Transportation Needs: Not on file  Physical Activity: Not on file  Stress: Not on file  Social Connections: Not on file  Intimate Partner Violence: Not on file    Past Surgical History:  Procedure Laterality Date   SEPTOPLASTY  2007   WRIST SURGERY      Family History  Problem Relation Age of Onset   Alcohol abuse Paternal Aunt    Alcohol abuse Paternal  Uncle    Stroke Paternal Uncle    Hyperlipidemia Maternal Grandfather    Hypertension Maternal Grandfather    Diabetes Maternal Grandfather    Alcohol abuse Maternal Grandfather    Multiple sclerosis Mother    Dementia Mother     Allergies  Allergen Reactions   Biaxin [Clarithromycin] Other (See Comments)    Causes colitis flares   Milk-Related Compounds Hives    Current Outpatient Medications on File Prior to Visit  Medication Sig Dispense Refill   acetaminophen (TYLENOL) 500 MG tablet Take 1,000 mg by mouth every 6 (six) hours as needed for headache (pain).     atorvastatin (LIPITOR) 40 MG tablet Take 1 tablet (40 mg total) by mouth daily. for cholesterol. 90 tablet 0   blood glucose meter kit and supplies KIT Dispense based on  patient and insurance preference. Use up to four times daily as directed. (FOR ICD-9 250.00, 250.01). 1 each 0   clobetasol ointment (TEMOVATE) 2.37 % Apply 1 application topically 2 (two) times daily.     Continuous Blood Gluc Receiver (DEXCOM G6 RECEIVER) DEVI 1 Device by Does not apply route continuous. 1 each 0   Continuous Blood Gluc Sensor (DEXCOM G6 SENSOR) MISC APPLY 1 SENSOR INTO THE SKIN EVERY 10 DAYS 3 each 5   Continuous Blood Gluc Transmit (DEXCOM G6 TRANSMITTER) MISC USE AS DIRECTED 1 each 0   Dulaglutide (TRULICITY) 1.5 SE/8.3TD SOPN Inject 1.5 mg into the skin once a week. For diabetes. 6 mL 0   hydrocortisone 2.5 % ointment Apply topically.     ibuprofen (ADVIL,MOTRIN) 200 MG tablet Take 400-600 mg by mouth every 6 (six) hours as needed for headache (pain).     inFLIXimab (REMICADE) 100 MG injection Inject into the muscle once a week.     Insulin Pen Needle (RELION PEN NEEDLES) 31G X 6 MM MISC USE 1  AT NIGHT WITH  INSULIN  AS  DIRECTED. 300 each 0   ketoconazole (NIZORAL) 2 % cream Apply topically 2 (two) times daily.     LANTUS SOLOSTAR 100 UNIT/ML Solostar Pen Inject 35 Units into the skin at bedtime. For diabetes. 30 mL 1   metFORMIN (GLUCOPHAGE) 1000 MG tablet TAKE 1 TABLET BY MOUTH TWICE DAILY WITH MEALS FOR DIABETES 180 tablet 3   SANTYL ointment Apply topically.     triamcinolone ointment (KENALOG) 0.1 % Apply topically.     venlafaxine XR (EFFEXOR-XR) 150 MG 24 hr capsule Take 1 capsule (150 mg total) by mouth daily with breakfast. For anxiety and depression. 90 capsule 0   No current facility-administered medications on file prior to visit.    BP 126/82   Pulse 87   Temp 98.6 F (37 C) (Oral)   Ht 5' 11"  (1.803 m)   Wt (!) 359 lb (162.8 kg)   SpO2 95%   BMI 50.07 kg/m  Objective:   Physical Exam HENT:     Right Ear: Tympanic membrane and ear canal normal.     Left Ear: Tympanic membrane and ear canal normal.     Nose: Nose normal.     Right Sinus: No  maxillary sinus tenderness or frontal sinus tenderness.     Left Sinus: No maxillary sinus tenderness or frontal sinus tenderness.  Eyes:     Conjunctiva/sclera: Conjunctivae normal.  Neck:     Thyroid: No thyromegaly.     Vascular: No carotid bruit.  Cardiovascular:     Rate and Rhythm: Normal rate and regular rhythm.  Heart sounds: Normal heart sounds.  Pulmonary:     Effort: Pulmonary effort is normal.     Breath sounds: Normal breath sounds. No wheezing or rales.  Chest:  Breasts:    Right: Tenderness present. No swelling, mass or skin change.     Left: No swelling, mass, skin change or tenderness.    Abdominal:     General: Bowel sounds are normal.     Palpations: Abdomen is soft.     Tenderness: There is no abdominal tenderness.  Musculoskeletal:        General: Normal range of motion.     Cervical back: Neck supple.  Skin:    General: Skin is warm and dry.  Neurological:     Mental Status: He is alert and oriented to person, place, and time.     Cranial Nerves: No cranial nerve deficit.     Deep Tendon Reflexes: Reflexes are normal and symmetric.  Psychiatric:        Mood and Affect: Mood normal.           Assessment & Plan:   Problem List Items Addressed This Visit       Cardiovascular and Mediastinum   Essential hypertension    Controlled.  Continue off medications.. Continue to monitor.         Respiratory   Seasonal allergic rhinitis    Stable.  Continue daily antihistamine.         Digestive   Ulcerative colitis ([place])    In remission.  Follows with GI annually.         Endocrine   Type 2 diabetes mellitus with hyperglycemia (HCC)    Glucose readings ranging in the low 100's, dipping to high 60's once weekly. Repeat A1C pending.  Continue Trulicity 1.5 mg weekly. Continue metformin 1000 mg once daily, he has been doing this for the last 3+ months.  Continue Lantus 35 units daily.  Foot exam today. Managed on  statin.  Urine microalbumin due and pending. Patient declines pneumonia vaccine.  Follow-up in 3 to 6 months based on A1c result.      Relevant Orders   Hemoglobin A1c   Microalbumin / creatinine urine ratio     Musculoskeletal and Integument   Pyoderma gangrenosum    Following with [facility] dermatology, improving.  Continue Remicade 100 mg once weekly        Other   Depression    Controlled.  Continue venlafaxine ER 150 mg daily.      Hyperlipidemia    Continue atorvastatin 40 mg daily. Repeat lipid panel pending.      Relevant Orders   Lipid panel   Comprehensive metabolic panel   Tobacco abuse    No longer on bupropion as this was ineffective.  He will be proceeding with laser acupuncture.      Encounter for annual general medical examination with abnormal findings in adult - Primary    Due for pneumonia vaccine, patient declines. Tetanus UTD.  Discussed the importance of a healthy diet and regular exercise in order for weight loss, and to reduce the risk of further co-morbidity.  Exam today as noted. Labs pending.      Acute breast pain    Unclear etiology. No lump/skin structure changes. He is tender on exam.  Orders placed for right breast ultrasound for further evaluation.  No family history of breast cancer.      Relevant Orders   US BREAST LTD UNI RIGHT INC AXILLA  Other Visit Diagnoses     Encounter for hepatitis C screening test for low risk patient       Relevant Orders   Hepatitis C Antibody          Pleas Koch, NP

## 2021-11-22 NOTE — Assessment & Plan Note (Signed)
Controlled.  Continue venlafaxine ER 150 mg daily.

## 2021-11-22 NOTE — Progress Notes (Signed)
[name], [name] ([id number]) Visit Report for 11/19/2021 Arrival Information Details Patient Name: [name], [name] Date of Service: 11/19/2021 8:00 AM Medical Record Number: [id number] Patient Account Number: [id number] Date of Birth/Sex: [birth year] (41 y.o. M) Treating RN: [name] Primary Care Trinadee Verhagen: [name] Other Clinician: Massie Kluver Referring Jonmarc Bodkin: [name] Treating Katlyn Muldrew/Extender: [name] Cliche in Treatment: 72 Visit Information History Since Last Visit All ordered tests and consults were completed: No Patient Arrived: Ambulatory Added or deleted any medications: No Arrival Time: 08:04 Any new allergies or adverse reactions: No Transfer Assistance: None Had a fall or experienced change in No Patient Requires Transmission-Based No activities of daily living that may affect Precautions: risk of falls: Patient Has Alerts: Yes Hospitalized since last visit: No Patient Alerts: Patient has reaction Pain Present Now: No to silver dressings. Electronic Signature(s) Signed: 11/22/2021 4:13:59 PM By: Massie Kluver Entered By: Massie Kluver on 11/19/2021 08:04:59 [name] ([id number]) -------------------------------------------------------------------------------- Clinic Level of Care Assessment Details Patient Name: [name] Date of Service: 11/19/2021 8:00 AM Medical Record Number: [id number] Patient Account Number: [id number] Date of Birth/Sex: July 11, 1980 (41 y.o. M) Treating RN: [name] Primary Care Raiyah Speakman: [name] Other Clinician: Massie Kluver Referring Atilano Covelli: [name] Treating Marketta Valadez/Extender: [name] Cliche in Treatment: 258 Clinic Level of Care Assessment Items TOOL 1 Quantity Score []  - Use when EandM and Procedure is performed on INITIAL visit 0 ASSESSMENTS - Nursing Assessment / Reassessment []  - General Physical Exam (combine w/ comprehensive assessment (listed just below) when performed on  new 0 pt. evals) []  - 0 Comprehensive Assessment (HX, ROS, Risk Assessments, Wounds Hx, etc.) ASSESSMENTS - Wound and Skin Assessment / Reassessment []  - Dermatologic / Skin Assessment (not related to wound area) 0 ASSESSMENTS - Ostomy and/or Continence Assessment and Care []  - Incontinence Assessment and Management 0 []  - 0 Ostomy Care Assessment and Management (repouching, etc.) PROCESS - Coordination of Care []  - Simple Patient / Family Education for ongoing care 0 []  - 0 Complex (extensive) Patient / Family Education for ongoing care []  - 0 Staff obtains Programmer, systems, Records, Test Results / Process Orders []  - 0 Staff telephones HHA, Nursing Homes / Clarify orders / etc []  - 0 Routine Transfer to another Facility (non-emergent condition) []  - 0 Routine Hospital Admission (non-emergent condition) []  - 0 New Admissions / Biomedical engineer / Ordering NPWT, Apligraf, etc. []  - 0 Emergency Hospital Admission (emergent condition) PROCESS - Special Needs []  - Pediatric / Minor Patient Management 0 []  - 0 Isolation Patient Management []  - 0 Hearing / Language / Visual special needs []  - 0 Assessment of Community assistance (transportation, D/C planning, etc.) []  - 0 Additional assistance / Altered mentation []  - 0 Support Surface(s) Assessment (bed, cushion, seat, etc.) INTERVENTIONS - Miscellaneous []  - External ear exam 0 []  - 0 Patient Transfer (multiple staff / Civil Service fast streamer / Similar devices) []  - 0 Simple Staple / Suture removal (25 or less) []  - 0 Complex Staple / Suture removal (26 or more) []  - 0 Hypo/Hyperglycemic Management (do not check if billed separately) []  - 0 Ankle / Brachial Index (ABI) - do not check if billed separately Has the patient been seen at the hospital within the last three years: Yes Total Score: 0 Level Of Care: ____ [name] ([id number]) Electronic Signature(s) Signed: 11/22/2021 4:13:59 PM By: Massie Kluver Entered By:  Massie Kluver on 11/19/2021 08:32:51 [name], [name] ([id number]) -------------------------------------------------------------------------------- Compression Therapy Details Patient Name: [name] Date of Service: 11/19/2021 8:00 AM Medical  Record Number: [id number] Patient Account Number: [id number] Date of Birth/Sex: January 15, 1981 (41 y.o. M) Treating RN: [name] Primary Care Shakila Mak: [name] Other Clinician: Massie Kluver Referring Zuriah Bordas: [name] Treating Kierre Hintz/Extender: Jeri Cos Weeks in Treatment: 258 Compression Therapy Performed for Wound Assessment: Wound #1 Left,Lateral Lower Leg Performed By: Lenice Pressman, Angie, Compression Type: Four Layer Pre Treatment ABI: 1.2 Electronic Signature(s) Signed: 11/22/2021 4:13:59 PM By: Massie Kluver Entered By: Massie Kluver on 11/19/2021 08:06:59 [name], [name] ([id number]) -------------------------------------------------------------------------------- Encounter Discharge Information Details Patient Name: [name] Date of Service: 11/19/2021 8:00 AM Medical Record Number: [id number] Patient Account Number: [id number] Date of Birth/Sex: June 12, 1980 (41 y.o. M) Treating RN: [name] Primary Care Trafton Roker: [name] Other Clinician: Massie Kluver Referring Seann Genther: [name] Treating Iliya Spivack/Extender: [name] Cliche in Treatment: 258 Encounter Discharge Information Items Discharge Condition: Stable Ambulatory Status: Ambulatory Discharge Destination: Home Transportation: Private Auto Accompanied By: self Schedule Follow-up Appointment: Yes Clinical Summary of Care: Electronic Signature(s) Signed: 11/22/2021 4:13:59 PM By: Massie Kluver Entered By: Massie Kluver on 11/19/2021 08:32:45 [name], [name] ([id number]) -------------------------------------------------------------------------------- Wound Assessment Details Patient Name: [name] Date of Service: 11/19/2021 8:00  AM Medical Record Number: [id number] Patient Account Number: [id number] Date of Birth/Sex: 1980/06/01 (41 y.o. M) Treating RN: [name] Primary Care Tiffannie Sloss: [name] Other Clinician: Massie Kluver Referring Anaisabel Pederson: [name] Treating Zyquan Crotty/Extender: [name] Cliche in Treatment: 258 Wound Status Wound Number: 1 Primary Etiology: Pyoderma Wound Location: Left, Lateral Lower Leg Wound Status: Open Wounding Event: Gradually Appeared Comorbid History: Sleep Apnea, Hypertension, Colitis Date Acquired: 11/18/2015 Weeks Of Treatment: 258 Clustered Wound: No Wound Measurements Length: (cm) 3.4 Width: (cm) 2.8 Depth: (cm) 0.2 Area: (cm) 7.477 Volume: (cm) 1.495 % Reduction in Area: -52.3% % Reduction in Volume: 61.9% Epithelialization: None Wound Description Classification: Full Thickness With Exposed Support Structures Exudate Amount: Medium Exudate Type: Serosanguineous Exudate Color: red, brown Slough/Fibrino Yes Wound Bed Granulation Amount: Small (1-33%) Granulation Quality: Pink Necrotic Amount: Small (1-33%) Necrotic Quality: Adherent Therapist, music) Signed: 11/19/2021 2:38:00 PM By: [name], [name], RN, [name], [name], [name] Signed: 11/22/2021 4:13:59 PM By: Massie Kluver Entered By: Massie Kluver on 11/19/2021 08:05:34

## 2021-11-22 NOTE — Assessment & Plan Note (Signed)
Continue atorvastatin 40 mg daily. Repeat lipid panel pending.

## 2021-11-22 NOTE — Patient Instructions (Signed)
Stop by the lab prior to leaving today. I will notify you of your results once received.   You will be contacted regarding your breast ultrasound.  Please let us know if you have not been contacted within two weeks.   It was a pleasure to see you today!  Preventive Care 52-43 Years Old, Male Preventive care refers to lifestyle choices and visits with your health care provider that can promote health and wellness. Preventive care visits are also called wellness exams. What can I expect for my preventive care visit? Counseling During your preventive care visit, your health care provider may ask about your: Medical history, including: Past medical problems. Family medical history. Current health, including: Emotional well-being. Home life and relationship well-being. Sexual activity. Lifestyle, including: Alcohol, nicotine or tobacco, and drug use. Access to firearms. Diet, exercise, and sleep habits. Safety issues such as seatbelt and bike helmet use. Sunscreen use. Work and work Statistician. Physical exam Your health care provider will check your: Height and weight. These may be used to calculate your BMI (body mass index). BMI is a measurement that tells if you are at a healthy weight. Waist circumference. This measures the distance around your waistline. This measurement also tells if you are at a healthy weight and may help predict your risk of certain diseases, such as type 2 diabetes and high blood pressure. Heart rate and blood pressure. Body temperature. Skin for abnormal spots. What immunizations do I need?  Vaccines are usually given at various ages, according to a schedule. Your health care provider will recommend vaccines for you based on your age, medical history, and lifestyle or other factors, such as travel or where you work. What tests do I need? Screening Your health care provider may recommend screening tests for certain conditions. This may include: Lipid and  cholesterol levels. Diabetes screening. This is done by checking your blood sugar (glucose) after you have not eaten for a while (fasting). Hepatitis B test. Hepatitis C test. HIV (human immunodeficiency virus) test. STI (sexually transmitted infection) testing, if you are at risk. Lung cancer screening. Prostate cancer screening. Colorectal cancer screening. Talk with your health care provider about your test results, treatment options, and if necessary, the need for more tests. Follow these instructions at home: Eating and drinking  Eat a diet that includes fresh fruits and vegetables, whole grains, lean protein, and low-fat dairy products. Take vitamin and mineral supplements as recommended by your health care provider. Do not drink alcohol if your health care provider tells you not to drink. If you drink alcohol: Limit how much you have to 0-2 drinks a day. Know how much alcohol is in your drink. In the [place] one drink equals one 12 oz bottle of beer (355 mL), one 5 oz glass of wine (148 mL), or one 1 oz glass of hard liquor (44 mL). Lifestyle Brush your teeth every morning and night with fluoride toothpaste. Floss one time each day. Exercise for at least 30 minutes 5 or more days each week. Do not use any products that contain nicotine or tobacco. These products include cigarettes, chewing tobacco, and vaping devices, such as e-cigarettes. If you need help quitting, ask your health care provider. Do not use drugs. If you are sexually active, practice safe sex. Use a condom or other form of protection to prevent STIs. Take aspirin only as told by your health care provider. Make sure that you understand how much to take and what form to take. Work with  your health care provider to find out whether it is safe and beneficial for you to take aspirin daily. Find healthy ways to manage stress, such as: Meditation, yoga, or listening to music. Journaling. Talking to a trusted  person. Spending time with friends and family. Minimize exposure to UV radiation to reduce your risk of skin cancer. Safety Always wear your seat belt while driving or riding in a vehicle. Do not drive: If you have been drinking alcohol. Do not ride with someone who has been drinking. When you are tired or distracted. While texting. If you have been using any mind-altering substances or drugs. Wear a helmet and other protective equipment during sports activities. If you have firearms in your house, make sure you follow all gun safety procedures. What's next? Go to your health care provider once a year for an annual wellness visit. Ask your health care provider how often you should have your eyes and teeth checked. Stay up to date on all vaccines. This information is not intended to replace advice given to you by your health care provider. Make sure you discuss any questions you have with your health care provider. Document Revised: 11/01/2020 Document Reviewed: 11/01/2020 Elsevier Patient Education  [place].

## 2021-11-22 NOTE — Assessment & Plan Note (Signed)
No longer on bupropion as this was ineffective.  He will be proceeding with laser acupuncture.

## 2021-11-22 NOTE — Assessment & Plan Note (Signed)
Controlled.  Continue off medications.. Continue to monitor.

## 2021-11-23 ENCOUNTER — Encounter: Payer: 59 | Admitting: Physician Assistant

## 2021-11-23 DIAGNOSIS — I872 Venous insufficiency (chronic) (peripheral): Secondary | ICD-10-CM | POA: Diagnosis not present

## 2021-11-23 LAB — HEPATITIS C ANTIBODY: Hepatitis C Ab: NONREACTIVE

## 2021-11-23 NOTE — Progress Notes (Addendum)
LAMONT, TANT ([id number]) Visit Report for 11/23/2021 Arrival Information Details Patient Name: [name], [name] Date of Service: 11/23/2021 8:15 AM Medical Record Number: [id number] Patient Account Number: [id number] Date of Birth/Sex: 09/26/1980 (42 y.o. M) Treating RN: [name] Primary Care Zyion Leidner: [name] Other Clinician: Referring Duc Crocket: [name] Treating Rayli Wiederhold/Extender: [name] Cliche in Treatment: 64 Visit Information History Since Last Visit Added or deleted any medications: No Patient Arrived: Ambulatory Has Dressing in Place as Prescribed: Yes Arrival Time: 08:22 Has Compression in Place as Prescribed: Yes Accompanied By: self Pain Present Now: No Transfer Assistance: None Patient Identification Verified: Yes Secondary Verification Process Completed: Yes Patient Requires Transmission-Based No Precautions: Patient Has Alerts: Yes Patient Alerts: Patient has reaction to silver dressings. Electronic Signature(s) Signed: 02/15/2022 12:24:00 PM By: Massie Kluver Previous Signature: 11/27/2021 3:46:53 PM Version By: [name], [name], RN, [name], [name], [name] Entered By: Massie Kluver on 12/14/2021 08:33:58 MACALLAN, ORD ([id number]) -------------------------------------------------------------------------------- Clinic Level of Care Assessment Details Patient Name: [name] Date of Service: 11/23/2021 8:15 AM Medical Record Number: [id number] Patient Account Number: [id number] Date of Birth/Sex: [birth year] (40 y.o. M) Treating RN: [name] Primary Care Dade Rodin: [name] Other Clinician: Referring Treyveon Mochizuki: [name] Treating Avion Kutzer/Extender: [name] Cliche in Treatment: 88 Clinic Level of Care Assessment Items TOOL 1 Quantity Score []  - Use when EandM and Procedure is performed on INITIAL visit 0 ASSESSMENTS - Nursing Assessment / Reassessment []  - General Physical Exam (combine w/ comprehensive assessment (listed just below) when  performed on new 0 pt. evals) []  - 0 Comprehensive Assessment (HX, ROS, Risk Assessments, Wounds Hx, etc.) ASSESSMENTS - Wound and Skin Assessment / Reassessment []  - Dermatologic / Skin Assessment (not related to wound area) 0 ASSESSMENTS - Ostomy and/or Continence Assessment and Care []  - Incontinence Assessment and Management 0 []  - 0 Ostomy Care Assessment and Management (repouching, etc.) PROCESS - Coordination of Care []  - Simple Patient / Family Education for ongoing care 0 []  - 0 Complex (extensive) Patient / Family Education for ongoing care []  - 0 Staff obtains Programmer, systems, Records, Test Results / Process Orders []  - 0 Staff telephones HHA, Nursing Homes / Clarify orders / etc []  - 0 Routine Transfer to another Facility (non-emergent condition) []  - 0 Routine Hospital Admission (non-emergent condition) []  - 0 New Admissions / Biomedical engineer / Ordering NPWT, Apligraf, etc. []  - 0 Emergency Hospital Admission (emergent condition) PROCESS - Special Needs []  - Pediatric / Minor Patient Management 0 []  - 0 Isolation Patient Management []  - 0 Hearing / Language / Visual special needs []  - 0 Assessment of Community assistance (transportation, D/C planning, etc.) []  - 0 Additional assistance / Altered mentation []  - 0 Support Surface(s) Assessment (bed, cushion, seat, etc.) INTERVENTIONS - Miscellaneous []  - External ear exam 0 []  - 0 Patient Transfer (multiple staff / Civil Service fast streamer / Similar devices) []  - 0 Simple Staple / Suture removal (25 or less) []  - 0 Complex Staple / Suture removal (26 or more) []  - 0 Hypo/Hyperglycemic Management (do not check if billed separately) []  - 0 Ankle / Brachial Index (ABI) - do not check if billed separately Has the patient been seen at the hospital within the last three years: Yes Total Score: 0 Level Of Care: ____ [name] ([id number]) Electronic Signature(s) Signed: 11/27/2021 3:46:53 PM By: [name], [name], RN, [name],  [name], [name] Entered By: [name], [name], RN, [name], [name] on 11/23/2021 08:59:05 [name] ([id number]) -------------------------------------------------------------------------------- Encounter Discharge Information Details Patient Name: [name]  Date of Service: 11/23/2021 8:15 AM Medical Record Number: [id number] Patient Account Number: [id number] Date of Birth/Sex: [birth year] (41 y.o. M) Treating RN: [name] Primary Care Ladanian Kelter: [name] Other Clinician: Referring Shaquala Broeker: [name] Treating Gena Laski/Extender: [name] Cliche in Treatment: 259 Encounter Discharge Information Items Post Procedure Vitals Discharge Condition: Stable Temperature (F): 97.7 Ambulatory Status: Ambulatory Pulse (bpm): 92 Discharge Destination: Home Respiratory Rate (breaths/min): 16 Transportation: Private Auto Blood Pressure (mmHg): 139/82 Accompanied By: self Schedule Follow-up Appointment: Yes Clinical Summary of Care: Electronic Signature(s) Signed: 11/27/2021 3:46:53 PM By: [name], [name], RN, [name], [name], [name] Entered By: [name], [name], RN, [name], [name] on 11/23/2021 09:00:21 [name] ([id number]) -------------------------------------------------------------------------------- Lower Extremity Assessment Details Patient Name: [name] Date of Service: 11/23/2021 8:15 AM Medical Record Number: [id number] Patient Account Number: [id number] Date of Birth/Sex: Jul 03, 1980 (40 y.o. M) Treating RN: [name] Primary Care Kenner Lewan: [name] Other Clinician: Referring Mikhia Dusek: [name] Treating Tisha Cline/Extender: [name] Cliche in Treatment: 259 Edema Assessment Assessed: [Left: No] [Right: No] [Left: Edema] [Right: :] Calf Left: Right: Point of Measurement: 35 cm From Medial Instep 47 cm Ankle Left: Right: Point of Measurement: 10 cm From Medial Instep 29.5 cm Vascular Assessment Pulses: Dorsalis Pedis Palpable: [Left:Yes] Electronic Signature(s) Signed:  11/27/2021 3:46:53 PM By: [name], [name], RN, [name], [name], [name] Entered By: [name], [name], RN, [name], [name] on 11/23/2021 08:25:48 [name] ([id number]) -------------------------------------------------------------------------------- Multi Wound Chart Details Patient Name: [name] Date of Service: 11/23/2021 8:15 AM Medical Record Number: [id number] Patient Account Number: [id number] Date of Birth/Sex: 1980-12-25 (40 y.o. M) Treating RN: [name] Primary Care Gregery Walberg: [name] Other Clinician: Referring Kiana Hollar: [name] Treating Elyanna Wallick/Extender: [name] Cliche in Treatment: 259 Vital Signs Height(in): 71 Pulse(bpm): 92 Weight(lbs): 338 Blood Pressure(mmHg): 139/82 Body Mass Index(BMI): 47.1 Temperature(F): 97.7 Respiratory Rate(breaths/min): 16 Photos: [N/A:N/A] Wound Location: Left, Lateral Lower Leg N/A N/A Wounding Event: Gradually Appeared N/A N/A Primary Etiology: Pyoderma N/A N/A Comorbid History: Sleep Apnea, Hypertension, Colitis N/A N/A Date Acquired: 11/18/2015 N/A N/A Weeks of Treatment: 259 N/A N/A Wound Status: Open N/A N/A Wound Recurrence: No N/A N/A Measurements L x W x D (cm) 2.5x2x0.2 N/A N/A Area (cm) : 3.927 N/A N/A Volume (cm) : 0.785 N/A N/A % Reduction in Area: 20.00% N/A N/A % Reduction in Volume: 80.00% N/A N/A Classification: Full Thickness With Exposed N/A N/A Support Structures Exudate Amount: Medium N/A N/A Exudate Type: Serosanguineous N/A N/A Exudate Color: red, brown N/A N/A Granulation Amount: Small (1-33%) N/A N/A Granulation Quality: Pink N/A N/A Necrotic Amount: Small (1-33%) N/A N/A Exposed Structures: Fat Layer (Subcutaneous Tissue): N/A N/A Yes Epithelialization: None N/A N/A Treatment Notes Electronic Signature(s) Signed: 11/27/2021 3:46:53 PM By: [name], [name], RN, [name], [name], [name] Entered By: [name], [name], RN, [name], [name] on 11/23/2021 08:40:00 [name]  ([id number]) -------------------------------------------------------------------------------- Multi-Disciplinary Care Plan Details Patient Name: [name] Date of Service: 11/23/2021 8:15 AM Medical Record Number: [id number] Patient Account Number: [id number] Date of Birth/Sex: 1980/08/27 (40 y.o. M) Treating RN: [name] Primary Care Breslin Burklow: [name] Other Clinician: Referring Kysha Muralles: [name] Treating Arlene Genova/Extender: [name] Cliche in Treatment: Baxter reviewed with physician Active Inactive Electronic Signature(s) Signed: 12/21/2021 3:42:41 PM By: [name] [name], RN, [name], [name], [name] Signed: 02/15/2022 12:24:00 PM By: Massie Kluver Previous Signature: 11/27/2021 3:46:53 PM Version By: [name], [name], RN, [name], [name], [name] Entered By: Massie Kluver on 12/14/2021 08:34:37 JESSIAH, STEINHART ([id number]) -------------------------------------------------------------------------------- Pain Assessment Details Patient Name: [name] Date  of Service: 11/23/2021 8:15 AM Medical Record Number: [id number] Patient Account Number: [id number] Date of Birth/Sex: 1981-03-07 (40 y.o. M) Treating RN: [name] Primary Care Geraline Halberstadt: [name] Other Clinician: Referring Kristeen Lantz: [name] Treating Lexey Fletes/Extender: [name] Cliche in Treatment: 259 Active Problems Location of Pain Severity and Description of Pain Patient Has Paino No Site Locations Pain Management and Medication Current Pain Management: Electronic Signature(s) Signed: 11/27/2021 3:46:53 PM By: [name], [name], RN, [name], [name], [name] Entered By: [name], [name], RN, [name], [name] on 11/23/2021 08:23:45 [name] ([id number]) -------------------------------------------------------------------------------- Patient/Caregiver Education Details Patient Name: [name] Date of Service: 11/23/2021 8:15 AM Medical Record Number: [id number] Patient Account Number: [id number] Date of  Birth/Gender: [birth year] (40 y.o. M) Treating RN: [name] Primary Care Physician: [name] Other Clinician: Referring Physician: [name] Treating Physician/Extender: [name] Cliche in Treatment: 259 Education Assessment Education Provided To: Patient Education Topics Provided Wound/Skin Impairment: Handouts: Caring for Your Ulcer Methods: Demonstration, Explain/Verbal Electronic Signature(s) Signed: 11/27/2021 3:46:53 PM By: [name], [name], RN, [name], [name], [name] Entered By: [name], [name], RN, [name], [name] on 11/23/2021 08:59:31 [name] ([id number]) -------------------------------------------------------------------------------- Wound Assessment Details Patient Name: [name] Date of Service: 11/23/2021 8:15 AM Medical Record Number: [id number] Patient Account Number: [id number] Date of Birth/Sex: [birth year] (40 y.o. M) Treating RN: [name] Primary Care Makeisha Jentsch: [name] Other Clinician: Referring Abygale Karpf: [name] Treating Kelven Flater/Extender: [name] Cliche in Treatment: 259 Wound Status Wound Number: 1 Primary Etiology: Pyoderma Wound Location: Left, Lateral Lower Leg Wound Status: Open Wounding Event: Gradually Appeared Comorbid History: Sleep Apnea, Hypertension, Colitis Date Acquired: 11/18/2015 Weeks Of Treatment: 259 Clustered Wound: No Photos Wound Measurements Length: (cm) 2.5 Width: (cm) 2 Depth: (cm) 0.2 Area: (cm) 3.927 Volume: (cm) 0.785 % Reduction in Area: 20% % Reduction in Volume: 80% Epithelialization: None Tunneling: No Undermining: No Wound Description Classification: Full Thickness With Exposed Support Structures Exudate Amount: Medium Exudate Type: Serosanguineous Exudate Color: red, brown Slough/Fibrino Yes Wound Bed Granulation Amount: Small (1-33%) Exposed Structure Granulation Quality: Pink Fat Layer (Subcutaneous Tissue) Exposed: Yes Necrotic Amount: Small (1-33%) Necrotic Quality: Adherent  Therapist, music) Signed: 11/27/2021 3:46:53 PM By: [name], [name], RN, [name], [name], [name] Entered By: [name], [name], RN, [name], [name] on 11/23/2021 08:24:42 [name] ([id number]) -------------------------------------------------------------------------------- Vitals Details Patient Name: [name] Date of Service: 11/23/2021 8:15 AM Medical Record Number: [id number] Patient Account Number: [id number] Date of Birth/Sex: [birth year] (40 y.o. M) Treating RN: [name] Primary Care Yachet Mattson: [name] Other Clinician: Referring Zachary Nole: [name] Treating Jaequan Propes/Extender: [name] Cliche in Treatment: 259 Vital Signs Time Taken: 08:20 Temperature (F): 97.7 Height (in): 71 Pulse (bpm): 92 Weight (lbs): 338 Respiratory Rate (breaths/min): 16 Body Mass Index (BMI): 47.1 Blood Pressure (mmHg): 139/82 Reference Range: 80 - 120 mg / dl Electronic Signature(s) Signed: 11/27/2021 3:46:53 PM By: [name], [name], RN, [name], [name], [name] Entered By: [name], [name], RN, [name], [name] on 11/23/2021 08:23:40

## 2021-11-23 NOTE — Progress Notes (Addendum)
[name], [name] ([id number]) Visit Report for 11/23/2021 Chief Complaint Document Details Patient Name: [name] Date of Service: 11/23/2021 8:15 AM Medical Record Number: [id number] Patient Account Number: [id number] Date of Birth/Sex: [birth year] (41 y.o. 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 M) Treating RN: [name] Primary Care Provider: [name] Other Clinician: Referring Provider: [name] Treating Provider/Extender: [name] Cliche in Treatment: 259 Cellular or Tissue Based Product Type Wound #1 Left,Lateral Lower Leg Applied to: Performed By: Physician Tommie Sams., PA-C Cellular or Tissue Based Product Apligraf Type: Level of Consciousness (Pre- Awake and Alert procedure): Pre-procedure Verification/Time Out Yes - 08:40 Taken: Location: trunk / arms / legs Wound Size (sq cm): 5 Product Size (sq cm): 44 Waste Size (sq cm): 39 Waste Reason: wound size Amount of Product Applied (sq cm): 5 Instrument Used: Blade, Forceps, Scissors Lot #: [id number] Expiration Date: 11/28/2021 Fenestrated: Yes Instrument: Blade Reconstituted: No Secured: Yes Secured With: Steri-Strips Dressing Applied: Yes Primary Dressing: mepitel Response to Treatment: Procedure was  tolerated well Level of Consciousness (Post- Awake and Alert procedure): Post Procedure Diagnosis Same as Pre-procedure Electronic Signature(s) Signed: 11/27/2021 3:46:53 PM By: [name], [name], RN, [name], [name], [name] Entered By: [name], [name], RN, [name], [name] on 11/23/2021 08:43:03 [name] ([id number]) -------------------------------------------------------------------------------- Debridement Details Patient Name: [name] Date of Service: 11/23/2021 8:15 AM Medical Record Number: [id number] Patient Account Number: [id number] Date of Birth/Sex: 1981/01/08 (40 y.o. M) Treating RN: [name] Primary Care Provider: [name] Other Clinician: Referring Provider: [name] Treating Provider/Extender: [name] Cliche in Treatment: 259 Debridement Performed for Wound #1 Left,Lateral Lower Leg Assessment: Performed By: Physician Tommie Sams., PA-C Debridement Type: Debridement Level of Consciousness (Pre- Awake and Alert procedure): Pre-procedure Verification/Time Out Yes - 08:40 Taken: Start Time: 08:35 Pain Control: Lidocaine Total Area Debrided (L x W): 2.5 (cm) x 2 (cm) = 5 (cm) Tissue and other material Viable, Non-Viable, Slough, Subcutaneous, Biofilm, Slough debrided: Level: Skin/Subcutaneous Tissue Debridement Description: Excisional Instrument: Curette Bleeding: Minimum Hemostasis Achieved: Pressure Response to Treatment: Procedure was tolerated well Level of Consciousness (Post- Awake and Alert procedure): Post Debridement Measurements of Total Wound Length: (cm) 2.5 Width: (cm) 2 Depth: (cm) 0.3 Volume: (cm) 1.178 Character of Wound/Ulcer Post Debridement: Stable Post Procedure Diagnosis Same as Pre-procedure Electronic Signature(s) Signed: 11/23/2021 5:04:18 PM By: Worthy Keeler PA-C Signed: 11/27/2021 3:46:53 PM By: [name], [name], RN, [name], [name], [name] Entered By: [name], [name], RN, [name], [name] on 11/23/2021 08:41:19 [name]  ([id number]) -------------------------------------------------------------------------------- HPI Details Patient Name: [name] Date of Service: 11/23/2021 8:15 AM Medical Record Number: [id number] Patient Account Number: [id number] Date of Birth/Sex: Jan 11, 1981 (40 y.o. M) Treating RN: [name] Primary Care Provider: [name] Other Clinician: Referring Provider: [name] Treating Provider/Extender: [name] Cliche in Treatment: 48 History of Present Illness HPI Description: 12/04/16; 41 year old man who comes into the clinic today for review of a wound on the posterior left calf. He tells me that is been there for about  a year. He is not a diabetic he does smoke half a pack per day. He was seen in the ER on 11/20/16 felt to have cellulitis around the wound and was given clindamycin. An x-ray did not show osteomyelitis. The patient initially tells me that he has a milk allergy that sets off a pruritic itching rash on his lower legs which she scratches incessantly and he thinks that's what may have set up the wound. He has been using various topical antibiotics and ointments without any effect. He works in a [employer] and is on his feet all day. He does not have a prior history of wounds however he does have the rash on both lower legs the right arm and the ventral aspect of his left arm. These are excoriations and clearly have had scratching however there are of macular looking areas on both legs including a substantial larger area on the right leg. This does not have an underlying open area. There is no blistering. The patient tells me that 2 years ago in [place] in response to the rash on his legs he saw a dermatologist who told him he had a condition which may be pyoderma gangrenosum although I may be putting words into his mouth. He seemed to recognize this. On further questioning he admits to a 5 year history of quiesced. ulcerative colitis. He is not in any treatment  for this. He's had no recent travel 12/11/16; the patient arrives today with his wound and roughly the same condition we've been using silver alginate this is a deep punched out wound with some surrounding erythema but no tenderness. Biopsy I did did not show confirmed pyoderma gangrenosum suggested nonspecific inflammation and vasculitis but does not provide an actual description of what was seen by the pathologist. I'm really not able to understand this We have also received information from the patient's dermatologist in [place] notes from April 2016. This was a doctor Agarwal-antal. 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 He was prescribed topical steroid high potency under occlusion which helped but at this point the patient did not have a deep punched out wound. 12/18/16; the patient's wound is larger in terms of surface area however this surface looks better and there is less depth. The surrounding erythema also is better. The patient states that the wrap we put on came off 2 days ago when he has been using his compression stockings. He we are in the process of getting a dermatology consult. 12/26/16 on evaluation today patient's left lower extremity wound shows evidence of infection with surrounding erythema noted. He has been tolerating the dressing changes but states that he has noted more discomfort. There is a larger area of erythema surrounding the wound. No fevers, chills, nausea, or vomiting noted at this time. With that being said the wound still does have slough covering the surface. He is not allergic to any medication that he is aware of at this point. In regard to his right lower extremity he had several regions that are erythematous and pruritic he wonders if there's anything we can do to help that. 01/02/17 I reviewed patient's wound culture which was obtained his visit last week. He was placed on doxycycline at that point. Unfortunately that does not appear to be an  antibiotic that would likely help with the situation however the pseudomonas noted on culture is sensitive to Cipro. Also unfortunately patient's wound seems to have a large compared to last week's evaluation. Not severely so  but there are definitely increased measurements in general. He is continuing to have discomfort as well he writes this to be a seven out of 10. In fact he would prefer me not to perform any debridement today due to the fact that he is having discomfort and considering he has an active infection on the little reluctant to do so anyway. No fevers, chills, nausea, or vomiting noted at this time. 01/08/17; patient seems dermatology on September 5. I suspect dermatology will want the slides from the biopsy I did sent to their pathologist. I'm not sure if there is a way we can expedite that. In any case the culture I did before I left on vacation 3 weeks ago showed Pseudomonas he was given 10 days of Cipro and per her description of her intake nurses is actually somewhat better this week although the wound is quite a bit bigger than I remember the last time I saw this. He still has 3 more days of Cipro 01/21/17; dermatology appointment tomorrow. He has completed the ciprofloxacin for Pseudomonas. Surface of the wound looks better however he is had some deterioration in the lesions on his right leg. Meantime the left lateral leg wound we will continue with sample 01/29/17; patient had his dermatology appointment but I can't yet see that note. He is completed his antibiotics. The wound is more superficial but considerably larger in circumferential area than when he came in. This is in his left lateral calf. He also has swollen erythematous areas with superficial wounds on the right leg and small papular areas on both arms. There apparently areas in her his upper thighs and buttocks I did not look at those. Dermatology biopsied the right leg. Hopefully will have their input next  week. 02/05/17; patient went back to see his dermatologist who told him that he had a "scratching problem" as well as staph. He is now on a 30 day course of doxycycline and I believe she gave him triamcinolone cream to the right leg areas to help with the itching [not exactly sure but probably triamcinolone]. She apparently looked at the left lateral leg wound although this was not rebiopsied and I think felt to be ultimately part of the same pathogenesis. He is using sample border foam and changing nevus himself. He now has a new open area on the right posterior leg which was his biopsy site I don't have any of the dermatology notes 02/12/17; we put the patient in compression last week with SANTYL to the wound on the left leg and the biopsy. Edema is much better and the depth of the wound is now at level of skin. Area is still the same oBiopsy site on the right lateral leg we've also been using santyl with a border foam dressing and he is changing this himself. 02/19/17; Using silver alginate started last week to both the substantial left leg wound and the biopsy site on the right wound. He is tolerating compression well. Has a an appointment with his primary M.D. tomorrow wondering about diuretics although I'm wondering if the edema problem is actually lymphedema 02/26/17; the patient has been to see his primary doctor Dr. Jerrel Ivory at [facility] our primary care. She started him on Lasix 20 mg and this seems to have helped with the edema. However we are not making substantial change with the left lateral calf wound and inflammation. The biopsy site on the right leg also looks stable but not really all that different. 03/12/17; the patient has  been to see vein and vascular Dr. Lucky Cowboy. He has had venous reflux studies I have not reviewed these. I did get a call from his dermatology office. They felt that he might have pathergy based on their biopsy on his right leg which led them to look at the slides  of VIPUL, CAFARELLI ([id number]) the biopsy I did on the left leg and they wonder whether this represents pyoderma gangrenosum which was the original supposition in a man with ulcerative colitis albeit inactive for many years. They therefore recommended clobetasol and tetracycline i.e. aggressive treatment for possible pyoderma gangrenosum. 03/26/17; apparently the patient just had reflux studies not an appointment with Dr. dew. She arrives in clinic today having applied clobetasol for 2-3 weeks. He notes over the last 2-3 days excessive drainage having to change the dressing 3-4 times a day and also expanding erythema. 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 Lillia Carmel at [facility] dermatology locally who kindly saw him at my request. 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 My concern was the degree of erythema medially extending from his left leg wound which was either inflammation from pyoderma or cellulitis. I put him on Augmentin however culture of the wound showed Pseudomonas which is quinolone sensitive. I really don't believe he has cellulitis however in view of everything I will continue and give him a course of Cipro. He is also on doxycycline as an immune modulator for the pyoderma. In addition to his original wound on the left lateral leg with surrounding erythema he has a wound on the right posterior calf which was an original biopsy site done by dermatology. This was felt to represent pathergy from pyoderma gangrenosum 04/16/17; pyoderma gangrenosum. Saw Dr. Lillia Carmel yesterday. He has been using topical antibiotics to both wound areas his original wound on the left and the  biopsies/pathergy area on the right. There is definitely some improvement in the inflammation around the wound on the right although the patient states he has increasing sensitivity of the wounds. He is on prednisone 60 and doxycycline 1 as prescribed by Dr. Lillia Carmel. He is covering the topical antibiotic with gauze and putting this in his own compression stocks and changing this daily. He states that Dr. Lottie Rater did a culture of the left leg wound yesterday 05/07/17; pyoderma gangrenosum. The patient saw Dr. Lillia Carmel yesterday and has a follow-up with her in one month. He is still using topical antibiotics to both wounds although he can't recall exactly what type. He is still on prednisone 60 mg. Dr. Lillia Carmel stated that the doxycycline could stop if we were in agreement. 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 The right medial leg was induced by biopsy/pathergy. The area on the right is essentially healed. Still on high-dose prednisone using topical antibiotics to the wound 07/09/17; pyoderma gangrenosum with wounds on the left lateral leg. The right medial leg has closed and remains closed. He is still on prednisone 60. oHe tells me he missed his last dermatology appointment with Dr. Lillia Carmel but will make another appointment. He reports that her blood sugar at a recent screen in  [place] was high 200's. He was 180 today. 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 I think he is going to require still much longer prednisone perhaps another 3 months before attempting to taper. In the meantime his wound is a lot better. Smaller. He is cleaning this off daily and applying topical antibiotics. When he was last in the clinic I thought about changing to [facility] and actually put in a couple of calls to dermatology although probably not during their business hours. In any case the wound  looks better smaller I don't think there is any need to change what he is doing 08/06/17-he is here in follow up evaluation for pyoderma left leg ulcer. He continues on oral prednisone. He has been using triple antibiotic ointment. There is surface debris and we will transition to [facility] and have him return in 2 weeks. He has lost 30 pounds since his last appointment with lifestyle modification. He may benefit from topical steroid cream for treatment this can be considered at a later date. 08/22/17 on evaluation today patient appears to actually be doing rather well in regard to his left lateral lower extremity ulcer. He has actually been managed by Dr. Dellia Nims most recently. Patient is currently on oral steroids at this time. This seems to have been of benefit for him. Nonetheless his last visit was actually with Leah on 08/06/17. Currently he is not utilizing any topical steroid creams although this could be of benefit as well. No fevers, chills, nausea, or vomiting noted at this time. 09/05/17 on evaluation today patient appears to be doing better in regard to his left lateral lower extremity ulcer. He has been tolerating the dressing changes without complication. He is using Santyl with good effect. Overall I'm very pleased with how things are standing at this point. Patient likewise is happy that this is doing better. 09/19/17 on evaluation today patient actually appears to be doing rather well in regard to his left lateral lower extremity ulcer. Again this is secondary to Pyoderma gangrenosum and he seems to be progressing well with the Santyl which is good news. He's not having any significant pain. 10/03/17 on evaluation today patient appears to be doing excellent in regard to his lower extremity wound on the left secondary to Pyoderma gangrenosum. He has been tolerating the Santyl without complication and in general I feel like he's making good progress. 10/17/17 on evaluation today patient appears  to be doing very well in regard to his left lateral lower surety ulcer. He has been tolerating the dressing changes without complication. There does not appear to be any evidence of infection he's alternating the Santyl and the triple antibiotic ointment every other day this seems to be doing well for him. 11/03/17 on evaluation today patient appears to be doing very well in regard to his left lateral lower extremity ulcer. He is been tolerating the dressing changes without complication which is good news. Fortunately there does not appear to be any evidence of infection which is also great news. Overall is doing excellent they are starting to taper down on the prednisone is down to 40 mg at this point it also started topical clobetasol for him. 11/17/17 on evaluation today patient appears to be doing well in regard to his left lateral lower surety ulcer. He's been tolerating the dressing changes without complication. He does note that he is having no pain, no excessive drainage or discharge, and overall he feels like things are going about how he  would expect and hope they would. Overall he seems to have no evidence of infection at this time in my opinion which is good news. 12/04/17-He is seen in follow-up evaluation for right lateral lower extremity ulcer. He has been applying topical steroid cream. Today's measurement show slight increase in size. Over the next 2 weeks we will transition to every other day Santyl and steroid cream. He has been encouraged to monitor for changes and notify clinic with any concerns 12/15/17 on evaluation today patient's left lateral motion the ulcer and fortunately is doing worse again at this point. This just since last week to this week has close to doubled in size according to the patient. I did not seeing last week's I do not have a visual to compare this to in our system was also down so we do not have all the charts and at this point. Nonetheless it does have me  somewhat concerned in regard to the fact that again he was worried enough about it he has contact the dermatology that placed them back on the full strength, 50 mg a day of the prednisone that he was taken previous. He continues to alternate using clobetasol along with Santyl at this point. He is obviously somewhat frustrated. 12/22/17 on evaluation today patient appears to be doing a little worse compared to last evaluation. Unfortunately the wound is a little deeper and slightly larger than the last week's evaluation. With that being said he has made some progress in regard to the irritation surrounding at this time unfortunately despite that progress that's been made he still has a significant issue going on here. 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 JAMARIEN, RODKEY ([id number]) For that reason I discussed with him today the possibility of perform a wound culture to ensure there's nothing overtly infected. 01/06/18 on evaluation today patient's wound is larger and deeper than previously evaluated. With that being said it did appear that his wound was infected after my last evaluation with him. Subsequently I did end up prescribing a prescription for Bactrim DS which she has been taking and having no complication with. Fortunately there does not appear to be any evidence of infection at this point in time as far as anything spreading, no want to touch, and overall I feel like things are showing signs of improvement. 01/13/18 on evaluation today patient appears to be even a little larger and deeper than last time. There still muscle exposed in the base of the wound. Nonetheless he does appear to be less erythematous I do believe inflammation is calming down also believe the infection looks like it's probably resolved at this time based on what I'm  seeing. No fevers, chills, nausea, or vomiting noted at this time. 01/30/18 on evaluation today patient actually appears to visually look better for the most part. Unfortunately those visually this looks better he does seem to potentially have what may be an abscess in the muscle that has been noted in the central portion of the wound. This is the first time that I have noted what appears to be fluctuance in the central portion of the muscle. With that being said I'm somewhat more concerned about the fact that this might indicate an abscess formation at this location. I do believe that an ultrasound would be appropriate. This is likely something we need to try to do as  soon as possible. He has been switch to mupirocin ointment and he is no longer using the steroid ointment as prescribed by dermatology he sees them again next week he's been decreased from 60 to 40 mg of prednisone. 03/09/18 on evaluation today patient actually appears to be doing a little better compared to last time I saw him. There's not as much erythema surrounding the wound itself. He I did review his most recent infectious disease note which was dated 02/24/18. He saw Dr. Michel Bickers in [place]. 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 Patient is now off of antibiotics and they are continually observing this. There seems to be no change in the past two weeks in my pinion based on what the patient says and what I see today compared to what Dr. Megan Salon likely saw two weeks ago. No fevers, chills, nausea, or vomiting noted at this time. 03/23/18 on evaluation today patient's wound actually appears to be showing signs of improvement which is good news. He is currently still on the Dapsone. He is also working on tapering the prednisone to get off of this and Dr. Lottie Rater is working with him in this regard. Nonetheless overall I feel like the  wound is doing well it does appear based on the infectious disease note that I reviewed from Dr. 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 I did also review the results of his ultrasound of left lower extremity which revealed there was a dentist tissue in the base of the wound without an abscess noted. 04/06/18 on evaluation today the patient's left lateral lower extremity ulcer actually appears to be doing fairly well which is excellent news. There does not appear to be any evidence of infection at this time which is also great news. Overall he still does have a significantly large ulceration although little by little he seems to be making progress. He is down to 10 mg a day of the prednisone. 04/20/18 on evaluation today patient actually appears to be doing excellent at this time in regard to his left lower extremity ulcer. He's making signs of good progress unfortunately this is taking much longer than we would really like to see but nonetheless he is making progress. Fortunately there does not appear to be any evidence of infection at this time. No fevers, chills, nausea, or vomiting noted at this time. The patient has not been using the Santyl due to the cost he hadn't got in this field yet. He's mainly been using the antibiotic ointment topically. Subsequently he also tells me that he really has not been scrubbing in the shower I think this would be helpful again as I told him it doesn't have to be anything too aggressive to even make it believe just enough to keep it free of some of the loose slough and biofilm on the wound surface. 05/11/18 on evaluation today patient's wound appears to be making slow but sure progress in regard to the left lateral lower extremity ulcer. He is been tolerating the dressing changes without complication. Fortunately there does not appear to be any  evidence of infection at this time. He is still just using triple antibiotic ointment along with clobetasol occasionally over the area. He never got the Santyl and really does not seem to  intend to in my pinion. 06/01/18 on evaluation today patient appears to be doing a little better in regard to his left lateral lower extremity ulcer. He states that overall he does not feel like he is doing as well with the Dapsone as he did with the prednisone. Nonetheless he sees his dermatologist later today and is gonna talk to them about the possibility of going back on the prednisone. Overall again I believe that the wound would be better if you would utilize Santyl but he really does not seem to be interested in going back to the [place] at this point. He has been using triple antibiotic ointment. 06/15/18 on evaluation today patient's wound actually appears to be doing about the same at this point. Fortunately there is no signs of infection at this time. He has made slight improvements although he continues to not really want to clean the wound bed at this point. He states that he just doesn't mess with it he doesn't want to cause any problems with everything else he has going on. He has been on medication, antibiotics as prescribed by his dermatologist, for a staff infection of his lower extremities which is really drying out now and looking much better he tells me. Fortunately there is no sign of overall infection. 06/29/18 on evaluation today patient appears to be doing well in regard to his left lateral lower surety ulcer all things considering. Fortunately his staff infection seems to be greatly improved compared to previous. He has no signs of infection and this is drying up quite nicely. He is still the doxycycline for this is no longer on cental, Dapsone, or any of the other medications. His dermatologist has recommended possibility of an infusion but right now he does not want to proceed with  that. 07/13/18 on evaluation today patient appears to be doing about the same in regard to his left lateral lower surety ulcer. Fortunately there's no signs of infection at this time which is great news. Unfortunately he still builds up a significant amount of Slough/biofilm of the surface of the wound he still is not really cleaning this as he should be appropriately. Again I'm able to easily with saline and gauze remove the majority of this on the surface which if you would do this at home would likely be a dramatic improvement for him as far as getting the area to improve. Nonetheless overall I still feel like he is making progress is just very slow. I think Santyl will be of benefit for him as well. Still he has not gotten this as of this point. 07/27/18 on evaluation today patient actually appears to be doing little worse in regards of the erythema around the periwound region of the wound he also tells me that he's been having more drainage currently compared to what he was experiencing last time I saw him. He states not quite as bad as what he had because this was infected previously but nonetheless is still appears to be doing poorly. Fortunately there is no evidence of systemic infection at this point. The patient tells me that he is not going to be able to afford the Santyl. He is still waiting to hear about the infusion therapy with his dermatologist. Apparently she wants an updated colonoscopy first. 08/10/18 on evaluation today patient appears to be doing better in regard to his left lateral lower extremity ulcer. Fortunately he is showing signs of improvement in this regard he's actually been approved for Remicade infusion's as well  although this has not been scheduled as of yet. Fortunately there's no signs of active infection at this time in regard to the wound although he is having some issues with infection of the right lower extremity is been seen as dermatologist for this. Fortunately  they are definitely still working with him trying to keep things under control. JARRIEL, PAPILLION ([id number]) 09/07/18 on evaluation today patient is actually doing rather well in regard to his left lateral lower extremity ulcer. He notes these actually having some hair grow back on his extremity which is something he has not seen in years. He also tells me that the pain is really not giving them any trouble at this time which is also good news overall she is very pleased with the progress he's using a combination of the mupirocin along with the probate is all mixed. 09/21/18 on evaluation today patient actually appears to be doing fairly well all things considered in regard to his looks from the ulcer. He's been tolerating the dressing changes without complication. Fortunately there's no signs of active infection at this time which is good news he is still on all antibiotics or prevention of the staff infection. He has been on prednisone for time although he states it is gonna contact his dermatologist and see if she put them on a short course due to some irritation that he has going on currently. Fortunately there's no evidence of any overall worsening this is going very slow I think cental would be something that would be helpful for him although he states that $50 for tube is quite expensive. He therefore is not willing to get that at this point. 10/06/18 on evaluation today patient actually appears to be doing decently well in regard to his left lateral leg ulcer. He's been tolerating the dressing changes without complication. Fortunately there's no signs of active infection at this time. Overall I'm actually rather pleased with the progress he's making although it's slow he doesn't show any signs of infection and he does seem to be making some improvement. I do believe that he may need a switch up and dressings to try to help this to heal more appropriately and quickly. 10/19/18 on evaluation today  patient actually appears to be doing better in regard to his left lateral lower extremity ulcer. This is shown signs of having much less Slough buildup at this point due to the fact he has been using the [employer]. Obviously this is very good news. The overall size of the wound is not dramatically smaller but again the appearance is. 11/02/18 on evaluation today patient actually appears to be doing quite well in regard to his lower [place] ulcer. A lot of the skin around the ulcer is actually somewhat irritating at this point this seems to be more due to the dressing causing irritation from the adhesive that anything else. Fortunately there is no signs of active infection at this time. 11/24/18 on evaluation today patient appears to be doing a little worse in regard to his overall appearance of his lower extremity ulcer. There's more erythema and warmth around the wound unfortunately. He is currently on doxycycline which he has been on for some time. With that being said I'm not sure that seems to be helping with what appears to possibly be an acute cellulitis with regard to his left lower extremity ulcer. No fevers, chills, nausea, or vomiting noted at this time. 12/08/18 on evaluation today patient's wounds actually appears to be doing significantly better compared  to his last evaluation. He has been using Santyl along with alternating tripling about appointment as well as the steroid cream seems to be doing quite well and the wound is showing signs of improvement which is excellent news. Fortunately there's no evidence of infection and in fact his culture came back negative with only normal skin flora noted. 12/21/2018 upon evaluation today patient actually appears to be doing excellent with regard to his ulcer. This is actually the best that I have seen it since have been helping to take care of him. It is both smaller as well as less slough noted on the surface of the wound and seems to be showing signs  of good improvement with new skin growing from the edges. He has been using just the triamcinolone he does wonder if he can get a refill of that ointment today. 01/04/2019 upon evaluation today patient actually appears to be doing well with regard to his left lateral lower extremity ulcer. With that being said it does not appear to be that he is doing quite as well as last time as far as progression is concerned. There does not appear to be any signs of infection or significant irritation which is good news. With that being said I do believe that he may benefit from switching to a collagen based dressing based on how clean The wound appears. 01/18/2019 on evaluation today patient actually appears to be doing well with regard to his wound on the left lower extremity. He is not made a lot of progress compared to where we were previous but nonetheless does seem to be doing okay at this time which is good news. There is no signs of active infection which is also good news. My only concern currently is I do wish we can get him into utilizing the collagen dressing his insurance would not pay for the supplies that we ordered although it appears that he may be able to order this through his supply company that he typically utilizes. This is [employer]. Nonetheless he did try to order it during the office visit today and it appears this did go through. We will see if he can get that it is a different brand but nonetheless he has collagen and I do think will be beneficial. 02/01/2019 on evaluation today patient actually appears to be doing a little worse today in regard to the overall size of his wounds. Fortunately there is no signs of active infection at this time. That is visually. Nonetheless when this is happened before it was due to infection. For that reason were somewhat concerned about that this time as well. 02/08/2019 on evaluation today patient unfortunately appears to be doing slightly worse with regard  to his wound upon evaluation today. Is measuring a little deeper and a little larger unfortunately. I am not really sure exactly what is causing this to enlarge he actually did see his dermatologist she is going to see about initiating Humira for him. Subsequently she also did do steroid injections into the wound itself in the periphery. Nonetheless still nonetheless he seems to be getting a little bit larger he is gone back to just using the steroid cream topically which I think is appropriate. I would say hold off on the collagen for the time being is definitely a good thing to do. Based on the culture results which we finally did get the final result back regarding it shows staph as the bacteria noted again that can be a normal skin  bacteria based on the fact however he is having increased drainage and worsening of the wound measurement wise I would go ahead and place him on an antibiotic today I do believe for this. 02/15/2019 on evaluation today patient actually appears to be doing somewhat better in regard to his ulcer. There is no signs of worsening at this time I did review his culture results which showed evidence of Staphylococcus aureus but not MRSA. Again this could just be more related to the normal skin bacteria although he states the drainage has slowed down quite a bit he may have had a mild infection not just colonization. And was much smaller and then since around10/04/2019 on evaluation today patient appears to be doing unfortunately worse as far as the size of the wound. I really feel like that this is steadily getting larger again it had been doing excellent right at the beginning of September we have seen a steady increase in the area of the wound it is almost 2-1/2 times the size it was on September 1. Obviously this is a bad trend this is not wanting to see. For that reason we went back to using just the topical triamcinolone cream which does seem to help with inflammation. I  checked him for bacteria by way of culture and nothing showed positive there. I am considering giving him a short course of a tapering steroid Dosepak today to see if that is can be beneficial for him. The patient is in agreement with giving that a try. 03/08/2019 on evaluation today patient appears to be doing very well in comparison to last evaluation with regard to his lower extremity ulcer. This is showing signs of less inflammation and actually measuring slightly smaller compared to last time every other week over the past month and a half he has been measuring larger larger larger. Nonetheless I do believe that the issue has been inflammation the prednisone does seem to [facility] [place] ([id number]) have been beneficial for him which is good news. No fevers, chills, nausea, vomiting, or diarrhea. 03/22/2019 on evaluation today patient appears to be doing about the same with regard to his leg ulcer. He has been tolerating the dressing changes without complication. With that being said the wound seems to be mostly arrested at its current size but really is not making any progress except for when we prescribed the prednisone. He did show some signs of dropping as far as the overall size of the wound during that interval week. Nonetheless this is something he is not on long-term at this point and unfortunately I think he is getting need either this or else the Humira which his dermatologist has discussed try to get approval for. With that being said he will be seeing his dermatologist on the 11th of this month that is November. 04/19/2019 on evaluation today patient appears to be doing really about the same the wound is measuring slightly larger compared to last time I saw him. He has not been into the office since November 2 due to the fact that he unfortunately had Covid as that his entire family. He tells me that it was rough but they did pull-through and he seems to be doing much better. Fortunately  there is no signs of active infection at this time. No fevers, chills, nausea, vomiting, or diarrhea. 05/10/2019 on evaluation today patient unfortunately appears to be doing significantly worse as compared to last time I saw him. He does tell me that he has had his first  dose of Humira and actually is scheduled to get the next one in the upcoming week. With that being said he tells me also that in the past several days he has been having a lot of issues with green drainage she showed me a picture this is more blue-green in color. He is also been having issues with increased sloughy buildup and the wound does appear to be larger today. Obviously this is not the direction that we want everything to take based on the starting of his Humira. Nonetheless I think this is definitely a result of likely infection and to be honest I think this is probably Pseudomonas causing the infection based on what I am seeing. 05/24/2019 on evaluation today patient unfortunately appears to be doing significantly worse compared to his prior evaluation with me 2 weeks ago. I did review his culture results which showed that he does have Staph aureus as well as Pseudomonas noted on the culture. Nonetheless the Levaquin that I prescribed for him does not appear to have been appropriate and in fact he tells me he is no longer experiencing the green drainage and discharge that he had at the last visit. Fortunately there is no signs of active infection at this time which is good news although the wound has significantly worsened it in fact is much deeper than it was previous. We have been utilizing up to this point triamcinolone ointment as the prescription topical of choice but at this time I really feel like that the wound is getting need to be packed in order to appropriately manage this due to the deeper nature of the wound. Therefore something along the lines of an alginate dressing may be more appropriate. 05/31/2019 upon  inspection today patient's wound actually showed signs of doing poorly at this point. Unfortunately he just does not seem to be making any good progress despite what we have tried. He actually did go ahead and pick up the Cipro and start taking that as he was noticing more green drainage he had previously completed the Levaquin that I prescribed for him as well. Nonetheless he missed his appointment for the seventh last week on Wednesday with the wound care center and [facility] where his dermatologist referred him. Obviously I do think a second opinion would be helpful at this point especially in light of the fact that the patient seems to be doing so poorly despite the fact that we have tried everything that I really know how at this point. The only thing that ever seems to have helped him in the past is when he was on high doses of continual steroids that did seem to make a difference for him. 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 06/07/2019 upon evaluation today patient unfortunately appears to be doing worse yet again with regard to his wound. In fact I am starting to question whether or not he may have a fluid pocket in the muscle at this point based on the bulging and the soft appearance to the central portion of the muscle area. There is not anything draining from the muscle itself at this time which is good news but nonetheless the wound is expanding. I am not really seeing any results of the Humira as far as overall wound progression based on what I  am seeing at this point. The patient has been referred for second opinion with regard to his wound to the [facility] wound care center by his dermatologist which I definitely am not in opposition to. Unfortunately we tried multiple dressings in the past including collagen, alginate, and at  one point even Hydrofera Blue. With that being said he is never really used it for any significant amount of time due to the fact that he often complains of pain associated with these dressings and then will go back to either using the Santyl which she has done intermittently or more frequently the triamcinolone. He is also using his own compression stockings. We have wrapped him in the past but again that was something else that he really was not a big fan of. 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 He seemed to do much better when he was on the steroids although honestly I understand that the steroids are not necessarily the best medication to be on long-term obviously 06/14/2019 on evaluation today patient appears to be doing actually a little bit better with regard to the overall appearance with his leg. Unfortunately he does continue to have issues with what appears to be some fluid underneath the muscle although he did see the wound specialty center at [facility] last week their main goals were to see about infusion therapy in place of the Humira as they feel like that is not quite strong enough. They also recommended that we continue with the treatment otherwise as we are they felt like that was appropriate and they are okay with him continuing to follow-up here with [place] in that regard. With that being said they are also sending him to the vein specialist there to see about vein stripping and if that would be of benefit for him. Subsequently they also did not really address whether or not an ultrasound of the muscle area to see if there is anything that needs to be addressed here  would be appropriate or not. For that reason I discussed this with him last week I think we may proceed down that road at this point. 06/21/2019 upon evaluation today patient's wound actually appears to be doing slightly better compared to previous evaluations. I do believe that he has made a difference with regard to the progression here with the use of oral steroids. Again in the past has been the only thing that is really calm things down. He does tell me that from [facility] is gotten a good news from there that there are no further vein stripping that is necessary at this point. I do not have that available for review today although the patient did relay this to me. He also did obtain and have the ultrasound of the wound completed which I did sign off on today. It does appear that there is no fluid collection under the muscle this is likely then just edematous tissue in general. That is also good news. Overall I still believe the inflammation is the main issue here. He did inquire about the possibility of a wound VAC again with the muscle protruding like it is I am not really  sure whether the wound VAC is necessarily ideal or not. That is something we will have to consider although I do believe he may need compression wrapping to try to help with edema control which could potentially be of benefit. 06/28/2019 on evaluation today patient appears to be doing slightly better measurement wise although this is not terribly smaller he least seems to be trending towards that direction. With that being said he still seems to have purulent drainage noted in the wound bed at this time. He has been on Levaquin followed by Cipro over the past month. Unfortunately he still seems to have some issues with active infection at this time. I did perform a culture last week in order to evaluate and see if indeed there was still anything going on. Subsequently the culture did come back showing Pseudomonas which is consistent  with the drainage has been having which is blue-green in color. He also has had an odor that again was somewhat consistent with Pseudomonas as well. Long story short it appears that the culture showed an intermediate finding with regard to how well the Cipro will work for the Pseudomonas infection. Subsequently being that he does not seem to be clearing up and at best what we are doing is just keeping this at [place] I think he may need to see infectious disease to discuss IV antibiotic options. [name], [name] ([id number]) 07/05/2019 upon evaluation today patient appears to be doing okay in regard to his leg ulcer. He has been tolerating the dressing changes at this point without complication. Fortunately there is no signs of active infection at this time which is good news. No fevers, chills, nausea, vomiting, or diarrhea. With that being said he does have an appointment with infectious disease tomorrow and his primary care on Wednesday. Again the reason for the infectious disease referral was due to the fact that he did not seem to be fully resolving with the use of oral antibiotics and therefore we were thinking that IV antibiotic therapy may be necessary secondary to the fact that there was an intermediate finding for how effective the Cipro may be. Nonetheless again he has been having a lot of purulent and even green drainage. Fortunately right now that seems to have calmed down over the past week with the reinitiation of the oral antibiotic. Nonetheless we will see what Dr. Megan Salon has to say. 07/12/2019 upon evaluation today patient appears to be doing about the same at this point in regard to his left lower extremity ulcer. Fortunately there is no signs of active infection at this time which is good news I do believe the Levaquin has been beneficial I did review Dr. Hale Bogus note and to be honest I agree that the patient's leg does appear to be doing better currently. What we found in the past as he  does not seem to really completely resolve he will stop the antibiotic and then subsequently things will revert back to having issues with blue-green drainage, increased pain, and overall worsening in general. Obviously that is the reason I sent him back to infectious disease. 07/19/2019 upon evaluation today patient appears to be doing roughly the same in size there is really no dramatic improvement. He has started back on the Levaquin at this point and though he seems to be doing okay he did still have a lot of blue/green drainage noted on evaluation today unfortunately. I think that this is still indicative more likely of a Pseudomonas infection as previously noted and again  he does see Dr. Megan Salon in just a couple of days. I do not know that were really able to effectively clear this with just oral antibiotics alone based on what I am seeing currently. Nonetheless we are still continue to try to manage as best we can with regard to the patient and his wound. I do think the wrap was helpful in decreasing the edema which is excellent news. No fevers, chills, nausea, vomiting, or diarrhea. 07/26/2019 upon evaluation today patient appears to be doing slightly better with regard to the overall appearance of the muscle there is no dark discoloration centrally. Fortunately there is no signs of active infection at this time. No fevers, chills, nausea, vomiting, or diarrhea. Patient's wound bed currently the patient did have an appointment with Dr. Megan Salon at infectious disease last week. With that being said Dr. Megan Salon the patient states was still somewhat hesitant about put him on any IV antibiotics he wanted [place] to repeat cultures today and then see where things go going forward. He does look like Dr. Megan Salon because of some improvement the patient did have with the Levaquin wanted [place] to see about repeating cultures. If it indeed grows the Pseudomonas again then he recommended a possibility of  considering a PICC line placement and IV antibiotic therapy. He plans to see the patient back in 1 to 2 weeks. 08/02/2019 upon evaluation today patient appears to be doing poorly with regard to his left lower extremity. We did get the results of his culture back it shows that he is still showing evidence of Pseudomonas which is consistent with the purulent/blue-green drainage that he has currently. Subsequently the culture also shows that he now is showing resistance to the oral fluoroquinolones which is unfortunate as that was really the only thing to treat the infection prior. I do believe that he is looking like this is going require IV antibiotic therapy to get this under control. Fortunately there is no signs of systemic infection at this time which is good news. The patient does see Dr. Megan Salon tomorrow. 08/09/2019 upon evaluation today patient appears to be doing better with regard to his left lower extremity ulcer in regard to the overall appearance. He is currently on IV antibiotic therapy. As ordered by Dr. Megan Salon. Currently the patient is on ceftazidime which she is going to take for the next 2 weeks and then follow-up for 4 to 5-week appointment with Dr. Megan Salon. The patient started this this past Friday symptoms have not for a total of 3 days currently in full. 08/16/2019 upon evaluation today patient's wound actually does show muscle in the base of the wound but in general does appear to be much better as far as the overall evidence of infection is concerned. In fact I feel like this is for the most part cleared up he still on the IV antibiotics he has not completed the full course yet but I think he is doing much better which is excellent news. 08/23/2019 upon evaluation today patient appears to be doing about the same with regard to his wound at this point. He tells me that he still has pain unfortunately. Fortunately there is no evidence of systemic infection at this time which is  great news. There is significant muscle protrusion. 09/13/19 upon evaluation today patient appears to be doing about the same in regard to his leg unfortunately. He still has a lot of drainage coming from the ulceration there is still muscle exposed. With that being said the patient's  last wound culture still showed an intermediate finding with regard to the Pseudomonas he still having the bluish/green drainage as well. Overall I do not know that the wound has completely cleared of infection at this point. Fortunately there is no signs of active infection systemically at this point which is good news. 09/20/2019 upon evaluation today patient's wound actually appears to be doing about the same based on what I am seeing currently. I do not see any signs of systemic infection he still does have evidence of some local infection and drainage. He did see Dr. Megan Salon last week and Dr. Megan Salon states that he probably does need a different IV antibiotic although he does not want to put him on this until the patient begins the Remicade infusion which is actually scheduled for about 10 days out from today on 13 May. Following that time Dr. Megan Salon is good to see him back and then will evaluate the feasibility of starting him on the IV antibiotic therapy once again at that point. I do not disagree with this plan I do believe as Dr. Megan Salon stated in his note that I reviewed today that the patient's issue is multifactorial with the pyoderma being 1 aspect of this that were hoping the Remicade will be helpful for her. In the meantime I think the gentamicin is, helping to keep things under decent okay control in regard to the ulcer. 09/27/2019 upon evaluation today patient appears to be doing about the same with regard to his wound still there is a lot of muscle exposure though he does have some hyper granulation tissue noted around the edge and actually some granulation tissue starting to form over the muscle which  is actually good news. Fortunately there is no evidence of active infection which is also good news. His pain is less at this point. 5/21; this is a patient I have not seen in a long time. 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 He has a large wound on the left lateral leg with protruding muscle. He comes in the clinic today showing the same area on his left medial ankle. He says there is been a spot there for some time although we have not previously defined this. Today he has a clearly defined area with slight amount of skin breakdown surrounded by raised areas with a purplish hue in color. This is not painful he says it is irritated. 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 He still has muscle protruding from the lateral portion of his left leg but fortunately the new area that they were concerned about at his last visit does not appear to have opened at this point. He is currently on Remicade infusions and seems to be doing better in my opinion in fact the wound itself seems to be overall much better. The purplish discoloration that he did have seems to have resolved and I think that is a good sign that hopefully the Remicade is doing its job. He does have some biofilm noted over the surface of the wound. 11/01/2019 on evaluation today patient's wound actually appears to be doing excellent at this time. Fortunately there is no evidence of active infection and overall I feel like he is making great progress. The Remicade seems to be due excellent job in my opinion. FELIPE, PALUCH ([id number]) 11/08/19 evaluation today vision actually appears to be doing quite well with regard  to his weight ulcer. He's been tolerating dressing changes without complication. Fortunately there is no evidence of infection. No fevers, chills, nausea, or vomiting noted at this  time. Overall states that is having more itching than pain which is actually a good sign in my opinion. 12/13/2019 upon evaluation today patient appears to be doing well today with regard to his wound. He has been tolerating the dressing changes without complication. Fortunately there is no sign of active infection at this time. No fevers, chills, nausea, vomiting, or diarrhea. Overall I feel like the infusion therapy has been very beneficial for him. 01/06/2020 on evaluation today patient appears to be doing well with regard to his wound. This is measuring smaller and actually looks to be doing better. Fortunately there is no signs of active infection at this point. No fevers, chills, nausea, vomiting, or diarrhea. With that being said he does still have the blue-green drainage but this does not seem to be causing any significant issues currently. He has been using the gentamicin that does seem to be keeping things under decent control at this point. He goes later this morning for his next infusion therapy for the pyoderma which seems to also be very beneficial. 02/07/2020 on evaluation today patient appears to be doing about the same in regard to his wounds currently. Fortunately there is no signs of active infection systemically he does still have evidence of local infection still using gentamicin. He also is showing some signs of improvement albeit slowly I do feel like we are making some progress here. 02/21/2020 upon evaluation today patient appears to be making some signs of improvement the wound is measuring a little bit smaller which is great news and overall I am very pleased with where he stands currently. He is going to be having infusion therapy treatment on the 15th of this month. Fortunately there is no signs of active infection at this time. 03/13/2020 I do believe patient's wound is actually showing some signs of improvement here which is great news. He has continue with the infusion  therapy through [facility]ology[facility] at [facility]. That does seem to be beneficial. I still think he gets as much benefit from this as he did from the prednisone initially but nonetheless obviously this is less harsh on his body that the prednisone as far as they are concerned. 03/31/2020 on evaluation today patient's wound actually showing signs of some pretty good improvement in regard to the overall appearance of the wound bed. There is still muscle exposed though he does have some epithelial growth around the edges of the wound. Fortunately there is no signs of active infection at this time. No fevers, chills, nausea, vomiting, or diarrhea. 04/24/2020 upon evaluation today patient appears to be doing about the same in regard to his leg ulcer. He has been tolerating the dressing changes without complication. Fortunately there is no signs of active infection at this time. No fevers, chills, nausea, vomiting, or diarrhea. With that being said he still has a lot of irritation from the bandaging around the edges of the wound. We did discuss today the possibility of a referral to plastic surgery. 05/22/2020 on evaluation today patient appears to be doing well with regard to his wounds all things considered. He has not been able to get the Chantix apparently there is a recall nurse that I was unaware of put out by [employer] involuntarily. Nonetheless for now I am and I have to do some research into what may be the best option  for him to help with quitting in regard to smoking and we discussed that today. 06/26/2020 upon evaluation today patient appears to be doing well with regard to his wound from the standpoint of infection I do not see any signs of infection at this point. With that being said unfortunately he is still continuing to have issues with muscle exposure and again he is not having a whole lot of new skin growth unfortunately. There does not appear to be any signs of active infection at this time. No  fevers, chills, nausea, vomiting, or diarrhea. 07/10/2020 upon evaluation today patient appears to be doing a little bit more poorly currently compared to where he was previous. I am concerned currently about an active infection that may be getting worse especially in light of the increased size and tenderness of the wound bed. No fevers, chills, nausea, vomiting, or diarrhea. 07/24/2020 upon evaluation today patient appears to be doing poorly in regard to his leg ulcer. He has been tolerating the dressing changes without complication but unfortunately is having a lot of discomfort. 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 Subsequently I think he is going require possibly IV antibiotics to get this under control. I am very concerned about the severity of his infection and the amount of discomfort he is having. 07/31/2020 upon evaluation today patient appears to be doing about the same in regard to his leg wound. He did see Dr. Megan Salon and Dr. Megan Salon is actually going to start him on IV antibiotics. He goes for the PICC line tomorrow. With that being said there do not have that run for 2 weeks and then see how things are doing and depending on how he is progressing they may extend that a little longer. Nonetheless I am glad this is getting ready to be in place and definitely feel it may help the patient. In the meantime is been using mainly triamcinolone to the wound bed has an anti-inflammatory. 08/07/2020 on evaluation today patient appears to be doing well with regard to his wound compared even last week. In the interim he has gotten the PICC line placed and overall this seems to be doing excellent. There does not appear to be any evidence of infection which is great news systemically although locally of course has had the infection this appears to be improving with the use of the antibiotics. 08/14/2020 upon evaluation today  patient's wound actually showing signs of excellent improvement. Overall the irritation has significantly improved the drainage is back down to more of a normal level and his pain is really pretty much nonexistent compared to what it was. Obviously I think that this is significantly improved secondary to the IV antibiotic therapy which has made all the difference in the world. Again he had a resistant form of Pseudomonas for which oral antibiotics just was not cutting it. Nonetheless I do think that still we need to consider the possibility of a surgical closure for this wound is been open so long and to be honest with muscle exposed I think this can be very hard to get this to close outside of this although definitely were still working to try to do what we can in that regard. 08/21/2020 upon evaluation today patient appears to be doing very well with regard to his wounds on the left lateral lower extremity/calf area. Fortunately there does not appear to be signs of active infection which is great news and overall very pleased  with where things stand today. He is actually wrapping up his treatment with IV antibiotics tomorrow. After that we will see where things go from there. 08/28/2020 upon evaluation today patient appears to be doing decently well with regard to his leg ulcer. There does not appear to be any signs of active infection which is great news and overall very pleased with where things stand today. No fevers, chills, nausea, vomiting, or diarrhea. 09/18/2020 upon evaluation today patient appears to be doing well with regard to his infection which I feel like is better. Unfortunately he is not doing as well with regard to the overall size of the wound which is not nearly as good at this point. I feel like that he may be having an issue here with the pyoderma being somewhat out of control. I think that he may benefit from potentially going back and talking to the dermatologist about [name], [name] ([id number]) what to do from the pyoderma standpoint. I am not certain if the infusions are helping nearly as much is what the prednisone did in the past. 10/02/2020 upon evaluation today patient appears to be doing well with regard to his leg ulcer. He did go to the [facility] nurse. Unfortunately they feel like there is a 10% chance that most that he would be able to heal and that the skin graft would take. Obviously this has led him to not be able to go down that path as far as treatment is concerned. Nonetheless he does seem to be doing a little bit better with the prednisone that I gave him last time. I think that he may need to discuss with dermatology the possibility of long-term prednisone as that seems to be what is most helpful for him to be perfectly honest. I am not sure the Remicade is really doing the job. 10/17/2020 upon evaluation today patient appears to be doing a little better in regard to his wound. In fact the case has been since we did the prednisone on May 2 for him that we have noticed a little bit of improvement each time we have seen a size wise as well as appearance wise as well as pain wise. I think the prednisone has had a greater effect then the infusion therapy has to be perfectly honest. With that being said the patient also feels significantly better compared to what he was previous. All of this is good news but nonetheless I am still concerned about the fact that again we are really not set up to long-term manage him as far as prednisone is concerned. Obviously there are things that you need to be watched I completely understand the risk of prednisone usage as well. 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 With all that being said I do believe that we can give him another round of the prednisone which she is requesting today because of the improvement that he seen since we did that first round. 10/30/2020 upon  evaluation today patient's wound actually is showing signs of doing quite well. There does not appear to be any evidence of infection which is great news and overall very pleased with where things stand today. No fevers, chills, nausea, vomiting, or diarrhea. He tells me that the prednisone still has seem to have helped he wonders if we can extend that for just a little bit longer. He did not have the appointment with a dermatologist although he did have an infusion  appointment last Friday. That was at [facility]. With that being said he tells me he could not do both that as well as the appointment with the physician on the same day therefore that is can have to be rescheduled. I really want to see if there is anything they feel like that could be done differently to try to help this out as I am not really certain that the infusions are helping significantly here. 11/13/2020 upon evaluation today patient unfortunately appears to be doing somewhat poorly in regard to his wound I feel like this is actually worsening from the standpoint of the pyoderma spreading. I still feel like that he may need something different as far as trying to manage this going forward. Again we did the prednisone unfortunately his blood sugars are not doing so well because of this. Nonetheless I believe that the patient likely needs to try topical steroid. We have done triamcinolone for a while I think going with something stronger such as clobetasol could be beneficial again this is not something I do lightly I discussed this with the patient that again this does not normally put underneath an occlusive dressing. Nonetheless I think a thin film as such could help with some of the stronger anti-inflammatory effects. We discussed this today. He would like to try to give this a trial for the next couple weeks. I definitely think that is something that we can do. Evaluate7/03/2021 and today patient's wound bed actually showed signs of  doing really about the same. There was a little expansion of the size of the wound and that leading edge that we done looking out although the clobetasol does seem to have slowed this down a bit in my opinion. There is just 1 small area that still seems to be progressing based on what I see. Nonetheless I am concerned about the fact this does not seem to be improving if anything seems to be doing a little bit worse. I do not know that the infusions are really helping him much as next infusion is August 5 his appointment with dermatology is July 25. Either way I really think that we need to have a conversation potentially about this and I am actually going to see if I can talk with Dr. Lillia Carmel in order to see where things stand as well. 12/11/2020 upon evaluation today patient appears to be doing worse in regard to his leg ulcer. Unfortunately I just do not think this is making the progress that I would like to see at this point. Honestly he does have an appointment with dermatology and this is in 2 days. I am wondering what they may have to offer to help with this. Right now what I am seeing is that he is continuing to show signs of worsening little by little. Obviously that is not great at all. Is the exact opposite of what we are looking for. 12/18/2020 upon evaluation today patient appears to be doing a little better in regard to his wound. The dermatologist actually did do some steroid injections into the wound which does seem to have been beneficial in my opinion. That was on the 25th already this looks a little better to me than last time I saw him. With that being said we did do a culture and this did show that he has Staph aureus noted in abundance in the wound. With that being said I do think that getting him on an oral antibiotic would be appropriate as well. Also  think we can compression wrap and this will make a difference as well. 12/28/2020 upon evaluation today patient's wound is  actually showing signs of doing much better. I do believe the compression wrap is helping he has a lot of drainage but to be honest I think that the compression is helping to some degree in this regard as well as not draining through which is also good news. No fevers, chills, nausea, vomiting, or diarrhea. 01/04/2021 upon evaluation today patient appears to be doing well with regard to his wound. Overall things seem to be doing quite well. He did have a little bit of reaction to the CarboFlex Sorbact he will be using that any longer. With that being said he is controlled as far as the drainage is concerned overall and seems to be doing quite well. I do not see any signs of active infection at this time which is great news. No fevers, chills, nausea, vomiting, or diarrhea. 01/11/2021 upon evaluation today patient appears to be doing well with regard to his wounds. He has been tolerating the dressing changes without complication. Fortunately there does not appear to be any signs of active infection at this time which is great news. Overall I am extremely pleased with where we stand currently. No fevers, chills, nausea, vomiting, or diarrhea. Where using clobetasol in the wound bed he has a lot of new skin growth which is awesome as well. 01/18/2021 upon evaluation today patient appears to be doing very well in regard to his leg ulcer. He has been tolerating the dressing changes without complication. Fortunately there does not appear to be any signs of active infection which is great news. In general I think that he is making excellent progress 01/25/2021 upon evaluation today patient appears to be doing well with regard to his wound on the leg. I am actually extremely pleased with where things stand today. There does not appear to be any signs of active infection which is great news and overall I think that we are definitely headed in the appropriate direction based on what I am seeing currently. There does  not appear to be any signs of active infection also excellent news. 02/06/2021 upon evaluation today patient appears to be doing well with regard to his wound. Overall visually this is showing signs of significant improvement which is great news. I do not see any signs of active infection systemically which is great even locally I do not think that we are seeing any major complications here. We did do fluorescence imaging with the [employer] today. The patient does have some odor and drainage noted and again this is something that I think would benefit him to probably come more frequently for nurse visits. 02/19/2021 upon evaluation today patient actually appears to be doing quite well in regard to his wound. He has been tolerating the dressing [name], [name] ([id number]) changes without complication and overall I think that this is making excellent progress. I do not see any evidence of active infection at this point which is great news as well. No fevers, chills, nausea, vomiting, or diarrhea. 10/10; wound is made nice progress healthy granulation with a nice rim of epithelialization which seems to be expanding even from last week he has a deeper area in the inferior part of the more distal part of the wound with not quite as healthy as surface. This area will need to be followed. Using clobetasol and Hydrofera Blue 03/05/2021 upon evaluation today patient appears to be doing very  well in regard to his leg ulcer. He has been tolerating dressing changes without complication. Fortunately there does not appear to be any signs of active infection which is great news and overall I am extremely pleased with where we stand currently. 03/12/2021 upon evaluation today patient appears to be doing well with regard to his wound in fact this is extremely extremely good based on what we are seeing today there does not appear to be any signs of active infection and overall I think that he is doing awesome from  the standpoint of healing in general. I am extremely pleased with how things seem to be progressing with regard to this pyoderma. Clobetasol has done wonders for him. I think the compression wrapping has also been of great benefit. 03/19/2021 upon evaluation today patient appears to be doing well with regard to his wound. He is tolerating the dressing changes without complication. In fact I feel like that he is actually making excellent progress at this point based on what I am seeing. No fevers, chills, nausea, vomiting, or diarrhea. 03/26/2021 upon evaluation today patient appears to be doing well with regard to his wound. This again is measuring smaller and looking better. Again the progress is slow but nonetheless continual with what we have been seeing. I do believe that the current plan is doing awesome for him. 04/09/2021 upon evaluation today patient appears to be doing well with regard to his leg ulcer. This is showing signs of excellent improvement the muscle is completely closed over and there does not appear to be any evidence of inflammation at this point his drainage is significantly improved. Overall I think that he would be a good candidate for looking into a skin substitute at this point as well. We will get a look into some approvals in that regard. Potentially TheraSkin as well as Apligraf could both be considered just depending on insurance coverage. 04/16/2021 upon evaluation today patient appears to be doing well at this point. He has been approved for the Apligraf which we could definitely order although I would like to try to get the TheraSkin approved if at all possible. I did fax notes into them today and I Georgina Peer try to give a call as well. Overall the wound appears to be doing decently well today. 04/20/2021 upon evaluation today patient actually appears to be doing quite well in regard to his wound with that being said we are trying to see what we can do about speeding up  the healing process. For that reason we did discuss the possibility of a skin substitute. We got the Apligraf approved. For that reason we will get a go ahead and see what we can do with the Apligraf at this point. I am still trying to get the TheraSkin approved but I have not heard anything from the insurance company yet I have called and talked to them earlier in the week and to be honest this was about half an hour that I spent on the phone they told me I would hear something within 10-15 business days. 04/27/2021 upon evaluation today patient appears to be doing excellent in regard to his wound. I do believe the Apligraf has been beneficial. With that being said he has had a little bit of increased pain has been a little bit concerned. I do want to go ahead and apply his steroid cream today the clobetasol and then put the Apligraf over I just do not think I want to risk not putting  the clobetasol on based on what is been going on here currently. Patient voiced understanding. 05/04/2021 upon evaluation today patient is making excellent improvement. Overall there is definitely decrease in the size of the wound today and I am very pleased in that regard. I do not see any signs of active infection locally nor systemically at this point. No fevers, chills, nausea, vomiting, or diarrhea. 05/11/2021 upon evaluation today patient appears to be doing well with regard to his wound. He has been tolerating Apligraf's without complication and is making excellent progress this is application #4 today. 12/30; patient here for Apligraf application. Appears to be doing well small wound there has been a lot of healing 05/28/2021 upon evaluation today patient appears to be doing well with regard to his wound. Has been tolerating the dressing changes without complication. Fortunately I do not see any signs of active infection locally nor systemically at this point which is great news. He is done with the Apligraf and  the first round and overall this has filled in quite nicely I am not even certain he needs any additional at the moment. I would actually try to go back to clobetasol with [facility] which previously was doing well for him. 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 He is using clobetasol and Hydrofera Blue. Surface area of the wound is smaller and the surface looks very healthy. 06/11/2021 upon evaluation today patient actually appears to be showing signs of good improvement which is great news. Fortunately I do not see any signs of active infection at this time which is also excellent news. I do believe that the patient is doing well with the clobetasol and Hydrofera Blue. He does have some irritation and itching around the rest of the leg will use some triamcinolone over this area. 06/18/2021 upon evaluation today patient appears to be doing well with regard to his wound. He is showing signs of excellent improvement and overall I am extremely pleased with where we stand today. We are moving in the right direction. 06/25/2021 upon evaluation today patient appears to be doing well with regard to his wound. In fact this is doing also not showing signs of excellent improvement overall very pleased with where we stand today. I do not see any evidence of active infection locally or systemically at this time which is also great news. 07/02/2021 upon evaluation patient's wound bed actually showed signs of good granulation and epithelization at this point. And this is measuring significantly smaller and overall I am extremely pleased with where we stand today. No fevers, chills, nausea, vomiting, or diarrhea. 07/09/2021 upon evaluation patient's wound bed actually showed signs of good granulation and epithelization at this point. Fortunately there does not appear to be any evidence of active infection locally or systemically which is great news and overall I am  extremely pleased with where we stand at this point. 07/16/2021 upon evaluation today patient appears to be doing well with regard to his wound all things considered although it is maybe a little bit larger than what its been. This does have me a little concerned. Actually question about whether anything is changed and initially he told me SIRRON, FRANCESCONI ([id number]) know. In fact it was not until the end of the visit that he actually did mention that he had missed his infusion in January. This very well may be what is because the difference is also seeing currently. That definitely has seem to been helping  more recently. 07/23/2021 upon evaluation today patient's wound is yet again larger despite the switch to a collagen which was not beneficial. Subsequently I am going to at this point check on a couple things. First and foremost I do want to go ahead and do a culture. I think that there is a chance he could have a low-lying infection that may be part of the issue here. Subsequently I am also probably can go ahead and place him on Bactrim DS which I think is a good option and has done well with them in the past. Again if this is an infection that should hopefully help to turn things around. With all that being said I do believe that the patient should hopefully be able to turn this around with reinitiation of the infusion therapy which will be going on this week and subsequently getting on the antibiotic. 3/13; patient presents for follow-up. He has no issues or complaints today. He states he is currently on oral antibiotics for previous culture done in the office. 08/06/2021 upon evaluation today patient appears to be doing well with regard to his wound. I am actually seeing signs of improvement which is great news. I think that he is on a better track he did receive his infusion as well which is great news and overall I think that this should hopefully get him back on track as far as healing is  concerned. He is not having any pain and I do not see any signs of inflammation which is great news. 08/13/2021 upon evaluation today patient appears to be doing excellent in regard to his wound on the leg. He has been tolerating the dressing changes without complication. Fortunately I do not see any signs of infection I do believe he is making good progress here and I think her back on track overall the tissue is greatly improved compared to what we have been. 08/20/2021 upon evaluation today patient appears to be doing excellent in regard to his wound. He has been tolerating the dressing changes without complication. Fortunately I do not see any evidence of infection at this time locally or systemically which is great news and overall very pleased with where we stand. 08-31-2021 upon evaluation today patient appears to be doing excellent in regard to his wound. Overall even with the vacation and extra walking he seems to have really done well. Fortunately I do not see any evidence of active infection locally or systemically which is great news and overall I think we are headed in the right direction. 09-10-2021 upon evaluation today patient's wound is showing signs of improvement. This is a small improvement but nonetheless we seem to be making progress here. I am very pleased in that regard. There does not appear to be any signs of infection and overall things are doing quite nicely. Upon inspection patient's wound bed actually showed signs of good granulation and epithelization at this point. There was some irritation around the edges of the wound where the good skin was it almost appears that something was rubbing or else is was due from drainage and irritation. Nonetheless I am going to see about putting a little bit of zinc just around the wound opening in order to protect the skin from being damaged. The patient is in agreement with that plan. 5/8; its been a while since I have seen this wound  and it looks considerably better although still with some depth. Per our intake nurse the dimensions have improved patient has been  using a thin layer of clobetasol, Hydrofera Blue under 4-layer compression. He appears to be making progress 10-01-2021 upon evaluation today patient appears to be doing well with regard to his wound although it is somewhat stalled I feel like were basically at a standstill here. He has previously had Apligraf which did very well for him to be honest. I do believe that he may benefit from a repeat of the Apligraf. In fact it got so small at that point but I do not think we even used all of the applications that were recommended as this had improved to such a degree. Nonetheless I do believe that the patient currently could benefit from repeat treatment with the Apligraf due to the improvement this that we did see. He is not opposed to this and subsequently working to look into getting approval to reinstitute that treatment. 10-08-2021 upon evaluation today patient appears to be doing well with regard to his wound. Overall I feel like he is doing a little bit better from the standpoint of irritation at this time which is great news. I do not see any signs of active infection locally or systemically which is great news as well. No fevers, chills, nausea, vomiting, or diarrhea. 10-12-2021 upon evaluation today patient appears to be doing well with regard to his wound stable although we are can initiate treatment with Apligraf today. I am hoping this will stimulate things to improve. Patient is in agreement with that plan. 10-19-2028 upon evaluation today patient appears to be doing well with regard to his wound. He has been tolerating the dressing changes without complication. Fortunately there does not appear to be any evidence of active infection locally or systemically at this time which is great news. No fevers, chills, nausea, vomiting, or diarrhea. I do believe the  Apligraf has improved the overall appearance of the wound bed receiving some definite improvements here. 10-26-2021 upon evaluation today patient's wound is actually shown some signs of really good improvement I am very pleased in this regard. There is a little bit of slough and biofilm buildup that is stuck to the surface of the wound I am getting very lightly debrided in order to clear this away and then subsequently will get a continue with the Apligraf application today. This is #3. 11-09-2021 upon evaluation today patient appears to be doing well currently in regard to his wound. This is actually showing signs of significant improvement which is great news and overall I think the Apligraf is doing an amazing job. This wound is significantly smaller compared to the last time I saw him. This will be Apligraf application #4. 10-25-3417 upon evaluation today patient appears to be doing well currently in regard to his wound he is actually showing signs of excellent improvement which is great news and overall I am extremely pleased with where we stand today. I do not see any evidence of active infection locally or systemically at this time which is great news. No fevers, chills, nausea, vomiting, or diarrhea. I believe the Apligraf is doing an awesome job and in fact I [place] see about getting an extension of application which hopefully should not be a problem. Especially in light of how much improvement he has had with regards to the size of the wound with treatment. This is application #5 today. Electronic Signature(s) Signed: 11/23/2021 9:02:34 AM By: Worthy Keeler PA-C Entered By: Worthy Keeler on 11/23/2021 09:02:34 [name], [name] ([id number]) GRAESYN, SCHREIFELS ([id number]) -------------------------------------------------------------------------------- Physical Exam Details Patient Name:  [name] Date of Service: 11/23/2021 8:15 AM Medical Record Number: [id number] Patient Account Number:  [id number] Date of Birth/Sex: 1980/12/12 (40 y.o. M) Treating RN: [name] Primary Care Provider: [name] Other Clinician: Referring Provider: [name] Treating Provider/Extender: [name] Cliche in Treatment: 65 Constitutional Well-nourished and well-hydrated in no acute distress. Respiratory normal breathing without difficulty. Psychiatric this patient is able to make decisions and demonstrates good insight into disease process. Alert and Oriented x 3. pleasant and cooperative. Notes Upon inspection patient's wound bed actually showed signs of good granulation and epithelization at this point. Fortunately I do not see any signs of active infection locally or systemically which is great news and overall I think that we are headed in the right direction. No fevers, chills, nausea, vomiting, or diarrhea. Electronic Signature(s) Signed: 11/23/2021 9:02:56 AM By: Worthy Keeler PA-C Entered By: Worthy Keeler on 11/23/2021 09:02:56 [name], [name] ([id number]) -------------------------------------------------------------------------------- Physician Orders Details Patient Name: [name] Date of Service: 11/23/2021 8:15 AM Medical Record Number: [id number] Patient Account Number: [id number] Date of Birth/Sex: 1981-05-12 (40 y.o. M) Treating RN: [name] Primary Care Provider: [name] Other Clinician: Referring Provider: [name] Treating Provider/Extender: [name] Cliche in Treatment: 259 Verbal / Phone Orders: No Diagnosis Coding ICD-10 Coding Code Description I87.2 Venous insufficiency (chronic) (peripheral) L97.222 Non-pressure chronic ulcer of left calf with fat layer exposed E11.622 Type 2 diabetes mellitus with other skin ulcer L88 Pyoderma gangrenosum F17.208 Nicotine dependence, unspecified, with other nicotine-induced disorders Follow-up Appointments o Return Appointment in 1 week. o Nurse Visit as needed - twice a  week Bathing/ Shower/ Hygiene o Clean wound with Normal Saline or wound cleanser. o May shower with wound dressing protected with water repellent cover or cast protector. o No tub bath. Cellular or Tissue Based Products Wound #1 Left,Lateral Lower Leg o Cellular or Tissue Based Product Type: - Apligraf #5 applied o Cellular or Tissue Based Product applied to wound bed; including contact layer, fixation with steri-strips, dry gauze and cover dressing. (DO NOT REMOVE). Edema Control - Lymphedema / Segmental Compressive Device / Other o Optional: One layer of unna paste to top of compression wrap (to act as an anchor). - He needs this to hold it up o Elevate, Exercise Daily and Avoid Standing for Long Periods of Time. o Elevate legs to the level of the heart and pump ankles as often as possible o Elevate leg(s) parallel to the floor when sitting. Wound Treatment Wound #1 - Lower Leg Wound Laterality: Left, Lateral Cleanser: Wound Cleanser 3 x Per Week/30 Days Discharge Instructions: Wash your hands with soap and water. Remove old dressing, discard into plastic bag and place into trash. Cleanse the wound with Wound Cleanser prior to applying a clean dressing using gauze sponges, not tissues or cotton balls. Do not scrub or use excessive force. Pat dry using gauze sponges, not tissue or cotton balls. Peri-Wound Care: Desitin Maximum Strength Ointment 4 (oz) 3 x Per Week/30 Days Discharge Instructions: Apply around wound edges to avoid maceration Peri-Wound Care: Triamcinolone Acetonide Cream, 0.1%, 15 (g) tube 3 x Per Week/30 Days Discharge Instructions: Apply to leg Topical: Clobetasol Propionate ointment 0.05%, 60 (g) tube 3 x Per Week/30 Days Discharge Instructions: Pt to bring to appt- applied to wound bed Secondary Dressing: (NON-BORDER) Zetuvit Plus Silicone NON-BORDER 5x5 (in/in) 3 x Per Week/30 Days Compression Wrap: Medichoice 4 layer Compression System, 35-40 mmHG 3  x Per Week/30 Days Discharge Instructions: Apply multi-layer wrap as directed. Electronic  Signature(s) [name], [name] ([id number]) Signed: 02/15/2022 12:24:00 PM By: Massie Kluver Signed: 02/15/2022 12:26:54 PM By: Worthy Keeler PA-C Previous Signature: 11/23/2021 5:04:18 PM Version By: Worthy Keeler PA-C Previous Signature: 11/27/2021 3:46:53 PM Version By: [name] [name], RN, [name], [name], [name] Entered By: Massie Kluver on 12/14/2021 08:34:16 TRYONE, KILLE ([id number]) -------------------------------------------------------------------------------- Problem List Details Patient Name: [name] Date of Service: 11/23/2021 8:15 AM Medical Record Number: [id number] Patient Account Number: [id number] Date of Birth/Sex: [birth year] (41 y.o. M) Treating RN: [name] Primary Care Provider: [name] Other Clinician: Referring Provider: [name] Treating Provider/Extender: [name] Cliche in Treatment: 259 Active Problems ICD-10 Encounter Code Description Active Date MDM Diagnosis I87.2 Venous insufficiency (chronic) (peripheral) 12/04/2016 No Yes L97.222 Non-pressure chronic ulcer of left calf with fat layer exposed 12/04/2016 No Yes E11.622 Type 2 diabetes mellitus with other skin ulcer 04/09/2021 No Yes L88 Pyoderma gangrenosum 03/26/2017 No Yes F17.208 Nicotine dependence, unspecified, with other nicotine-induced disorders 04/24/2020 No Yes Inactive Problems ICD-10 Code Description Active Date Inactive Date L97.213 Non-pressure chronic ulcer of right calf with necrosis of muscle 04/02/2017 04/02/2017 Resolved Problems ICD-10 Code Description Active Date Resolved Date L97.321 Non-pressure chronic ulcer of left ankle limited to breakdown of skin 10/08/2019 10/08/2019 L03.116 Cellulitis of left lower limb 05/24/2019 05/24/2019 Electronic Signature(s) Signed: 11/23/2021 8:23:06 AM By: Worthy Keeler PA-C Entered By: Worthy Keeler on 11/23/2021 08:23:05 [name]  ([id number]) -------------------------------------------------------------------------------- Progress Note Details Patient Name: [name] Date of Service: 11/23/2021 8:15 AM Medical Record Number: [id number] Patient Account Number: [id number] Date of Birth/Sex: 01-24-1981 (40 y.o. M) Treating RN: [name] Primary Care Provider: [name] Other Clinician: Referring Provider: [name] Treating Provider/Extender: [name] Cliche in Treatment: 46 Subjective Chief Complaint Information obtained from Patient He is here in follow up evaluation for LLE pyoderma ulcer History of Present Illness (HPI) 12/04/16; 41 year old man who comes into the clinic today for review of a wound on the posterior left calf. He tells me that is been there for about a year. He is not a diabetic he does smoke half a pack per day. He was seen in the ER on 11/20/16 felt to have cellulitis around the wound and was given clindamycin. An x-ray did not show osteomyelitis. The patient initially tells me that he has a milk allergy that sets off a pruritic itching rash on his lower legs which she scratches incessantly and he thinks that's what may have set up the wound. He has been using various topical antibiotics and ointments without any effect. He works in a [employer]ing [employer] and is on his feet all day. He does not have a prior history of wounds however he does have the rash on both lower legs the right arm and the ventral aspect of his left arm. These are excoriations and clearly have had scratching however there are of macular looking areas on both legs including a substantial larger area on the right leg. This does not have an underlying open area. There is no blistering. The patient tells me that 2 years ago in [place] in response to the rash on his legs he saw a dermatologist who told him he had a condition which may be pyoderma gangrenosum although I may be putting words into his mouth. He seemed to recognize  this. On further questioning he admits to a 5 year history of quiesced. ulcerative colitis. He is not in any treatment for this. He's had no recent travel 12/11/16; the patient arrives today with  his wound and roughly the same condition we've been using silver alginate this is a deep punched out wound with some surrounding erythema but no tenderness. Biopsy I did did not show confirmed pyoderma gangrenosum suggested nonspecific inflammation and vasculitis but does not provide an actual description of what was seen by the pathologist. I'm really not able to understand this We have also received information from the patient's dermatologist in [place] notes from April 2016. This was a doctor Agarwal-antal. 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 He was prescribed topical steroid high potency under occlusion which helped but at this point the patient did not have a deep punched out wound. 12/18/16; the patient's wound is larger in terms of surface area however this surface looks better and there is less depth. The surrounding erythema also is better. The patient states that the wrap we put on came off 2 days ago when he has been using his compression stockings. He we are in the process of getting a dermatology consult. 12/26/16 on evaluation today patient's left lower extremity wound shows evidence of infection with surrounding erythema noted. He has been tolerating the dressing changes but states that he has noted more discomfort. There is a larger area of erythema surrounding the wound. No fevers, chills, nausea, or vomiting noted at this time. With that being said the wound still does have slough covering the surface. He is not allergic to any medication that he is aware of at this point. In regard to his right lower extremity he had several regions that are erythematous and pruritic he wonders if there's anything we can do to help that. 01/02/17 I reviewed patient's wound culture which was  obtained his visit last week. He was placed on doxycycline at that point. Unfortunately that does not appear to be an antibiotic that would likely help with the situation however the pseudomonas noted on culture is sensitive to Cipro. Also unfortunately patient's wound seems to have a large compared to last week's evaluation. Not severely so but there are definitely increased measurements in general. He is continuing to have discomfort as well he writes this to be a seven out of 10. In fact he would prefer me not to perform any debridement today due to the fact that he is having discomfort and considering he has an active infection on the little reluctant to do so anyway. No fevers, chills, nausea, or vomiting noted at this time. 01/08/17; patient seems dermatology on September 5. I suspect dermatology will want the slides from the biopsy I did sent to their pathologist. I'm not sure if there is a way we can expedite that. In any case the culture I did before I left on vacation 3 weeks ago showed Pseudomonas he was given 10 days of Cipro and per her description of her intake nurses is actually somewhat better this week although the wound is quite a bit bigger than I remember the last time I saw this. He still has 3 more days of Cipro 01/21/17; dermatology appointment tomorrow. He has completed the ciprofloxacin for Pseudomonas. Surface of the wound looks better however he is had some deterioration in the lesions on his right leg. Meantime the left lateral leg wound we will continue with sample 01/29/17; patient had his dermatology appointment but I can't yet see that note. He is completed his antibiotics. The wound is more superficial but considerably larger in circumferential area than when he came in. This is in his left lateral calf. He  also has swollen erythematous areas with superficial wounds on the right leg and small papular areas on both arms. There apparently areas in her his upper thighs and  buttocks I did not look at those. Dermatology biopsied the right leg. Hopefully will have their input next week. 02/05/17; patient went back to see his dermatologist who told him that he had a "scratching problem" as well as staph. He is now on a 30 day course of doxycycline and I believe she gave him triamcinolone cream to the right leg areas to help with the itching [not exactly sure but probably triamcinolone]. She apparently looked at the left lateral leg wound although this was not rebiopsied and I think felt to be ultimately part of the same pathogenesis. He is using sample border foam and changing nevus himself. He now has a new open area on the right posterior leg which was his biopsy site I don't have any of the dermatology notes 02/12/17; we put the patient in compression last week with SANTYL to the wound on the left leg and the biopsy. Edema is much better and the depth of the wound is now at level of skin. Area is still the same Biopsy site on the right lateral leg we've also been using santyl with a border foam dressing and he is changing this himself. 02/19/17; Using silver alginate started last week to both the substantial left leg wound and the biopsy site on the right wound. He is tolerating compression well. Has a an appointment with his primary M.D. tomorrow wondering about diuretics although I'm wondering if the edema problem is actually lymphedema MATTIAS, WALMSLEY ([id number]) 02/26/17; the patient has been to see his primary doctor Dr. Jerrel Ivory at [facility] our primary care. She started him on Lasix 20 mg and this seems to have helped with the edema. However we are not making substantial change with the left lateral calf wound and inflammation. The biopsy site on the right leg also looks stable but not really all that different. 03/12/17; the patient has been to see vein and vascular Dr. Lucky Cowboy. He has had venous reflux studies I have not reviewed these. I did get a call from his  dermatology office. 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 They therefore recommended clobetasol and tetracycline i.e. aggressive treatment for possible pyoderma gangrenosum. 03/26/17; apparently the patient just had reflux studies not an appointment with Dr. dew. She arrives in clinic today having applied clobetasol for 2-3 weeks. He notes over the last 2-3 days excessive drainage having to change the dressing 3-4 times a day and also expanding erythema. 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 Lillia Carmel at [facility] dermatology locally who kindly saw him at my request. 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 My concern was the degree of erythema medially extending from his left leg wound which was either inflammation from pyoderma or cellulitis. I put him on  Augmentin however culture of the wound showed Pseudomonas which is quinolone sensitive. I really don't believe he has cellulitis however in view of everything I will continue and give him a course of Cipro. He is also on doxycycline as an immune modulator for the pyoderma. In addition to his original wound on the left lateral leg with surrounding erythema he has a wound on the right posterior calf which was an original biopsy site done by dermatology. 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 Saw Dr. Lillia Carmel  yesterday. He has been using topical antibiotics to both wound areas his original wound on the left and the biopsies/pathergy area on the right. There is definitely some improvement in the inflammation around the wound on the right although the patient states he has increasing sensitivity of the wounds. He is on prednisone 60 and doxycycline 1 as prescribed by Dr. Lillia Carmel. He is covering the topical antibiotic with gauze and putting this in his own compression stocks and changing this daily. He states that Dr. Lottie Rater did a culture of the left leg wound yesterday 05/07/17; pyoderma gangrenosum. The patient saw Dr. Lillia Carmel yesterday and has a follow-up with her in one month. He is still using topical antibiotics to both wounds although he can't recall exactly what type. He is still on prednisone 60 mg. Dr. Lillia Carmel stated that the doxycycline could stop if we were in agreement. 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 The right medial leg was induced by biopsy/pathergy. The area on the right is essentially healed. Still on high-dose prednisone using topical antibiotics to the wound 07/09/17; pyoderma gangrenosum with wounds on the left lateral leg. The right medial leg has closed and remains closed. He is still on prednisone 60. He tells me he missed his last dermatology appointment with Dr. Lillia Carmel but will make another appointment. He reports that her blood sugar at a recent screen in [place] was high 200's. He was 180 today. 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 I think he is going to require still much longer prednisone perhaps another 3 months before attempting to taper. In the meantime his wound is a lot better. Smaller. He is cleaning this off daily and applying topical antibiotics. When he was last in the clinic I thought about changing to [facility] and actually put in  a couple of calls to dermatology although probably not during their business hours. 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 He continues on oral prednisone. He has been using triple antibiotic ointment. There is surface debris and we will transition to [facility] and have him return in 2 weeks. He has lost 30 pounds since his last appointment with lifestyle modification. He may benefit from topical steroid cream for treatment this can be considered at a later date. 08/22/17 on evaluation today patient appears to actually be doing rather well in regard to his left lateral lower extremity ulcer. He has actually been managed by Dr. Dellia Nims most recently. Patient is currently on oral steroids at this time. This seems to have been of benefit for him. Nonetheless his last visit was actually with Leah on 08/06/17. Currently he is not utilizing any topical steroid creams although this could be of benefit  as well. No fevers, chills, nausea, or vomiting noted at this time. 09/05/17 on evaluation today patient appears to be doing better in regard to his left lateral lower extremity ulcer. He has been tolerating the dressing changes without complication. He is using Santyl with good effect. Overall I'm very pleased with how things are standing at this point. Patient likewise is happy that this is doing better. 09/19/17 on evaluation today patient actually appears to be doing rather well in regard to his left lateral lower extremity ulcer. Again this is secondary to Pyoderma gangrenosum and he seems to be progressing well with the Santyl which is good news. He's not having any significant pain. 10/03/17 on evaluation today patient appears to be doing excellent in regard to his lower extremity wound on the left secondary to Pyoderma gangrenosum. He has been tolerating the Santyl without  complication and in general I feel like he's making good progress. 10/17/17 on evaluation today patient appears to be doing very well in regard to his left lateral lower surety ulcer. He has been tolerating the dressing changes without complication. There does not appear to be any evidence of infection he's alternating the Santyl and the triple antibiotic ointment every other day this seems to be doing well for him. 11/03/17 on evaluation today patient appears to be doing very well in regard to his left lateral lower extremity ulcer. He is been tolerating the dressing changes without complication which is good news. Fortunately there does not appear to be any evidence of infection which is also great news. Overall is doing excellent they are starting to taper down on the prednisone is down to 40 mg at this point it also started topical clobetasol for him. 11/17/17 on evaluation today patient appears to be doing well in regard to his left lateral lower surety ulcer. He's been tolerating the dressing changes without complication. He does note that he is having no pain, no excessive drainage or discharge, and overall he feels like things are going about how he would expect and hope they would. Overall he seems to have no evidence of infection at this time in my opinion which is good news. 12/04/17-He is seen in follow-up evaluation for right lateral lower extremity ulcer. He has been applying topical steroid cream. Today's measurement show slight increase in size. Over the next 2 weeks we will transition to every other day Santyl and steroid cream. He has been encouraged to monitor for changes and notify clinic with any concerns 12/15/17 on evaluation today patient's left lateral motion the ulcer and fortunately is doing worse again at this point. This just since last week to this week has close to doubled in size according to the patient. I did not seeing last week's I do not have a visual to compare this to  in our system was also down so we do not have all the charts and at this point. Nonetheless it does have me somewhat concerned in regard to the fact that again he was worried enough about it he has contact the dermatology that placed them back on the full strength, 50 mg a day of the prednisone that he was taken previous. He continues to alternate using clobetasol along with Santyl at this point. He is obviously somewhat frustrated. ELIEZER, KHAWAJA ([id number]) 12/22/17 on evaluation today patient appears to be doing a little worse compared to last evaluation. Unfortunately the wound is a little deeper and slightly larger than the last week's evaluation. With that being said  he has made some progress in regard to the irritation surrounding at this time unfortunately despite that progress that's been made he still has a significant issue going on here. 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 For that reason I discussed with him today the possibility of perform a wound culture to ensure there's nothing overtly infected. 01/06/18 on evaluation today patient's wound is larger and deeper than previously evaluated. With that being said it did appear that his wound was infected after my last evaluation with him. Subsequently I did end up prescribing a prescription for Bactrim DS which she has been taking and having no complication with. Fortunately there does not appear to be any evidence of infection at this point in time as far as anything spreading, no want to touch, and overall I feel like things are showing signs of improvement. 01/13/18 on evaluation today patient appears to be even a little larger and deeper than last time. There still muscle exposed in the base of the wound. Nonetheless he does appear to be less erythematous I do believe inflammation is  calming down also believe the infection looks like it's probably resolved at this time based on what I'm seeing. No fevers, chills, nausea, or vomiting noted at this time. 01/30/18 on evaluation today patient actually appears to visually look better for the most part. Unfortunately those visually this looks better he does seem to potentially have what may be an abscess in the muscle that has been noted in the central portion of the wound. This is the first time that I have noted what appears to be fluctuance in the central portion of the muscle. With that being said I'm somewhat more concerned about the fact that this might indicate an abscess formation at this location. I do believe that an ultrasound would be appropriate. This is likely something we need to try to do as soon as possible. He has been switch to mupirocin ointment and he is no longer using the steroid ointment as prescribed by dermatology he sees them again next week he's been decreased from 60 to 40 mg of prednisone. 03/09/18 on evaluation today patient actually appears to be doing a little better compared to last time I saw him. There's not as much erythema surrounding the wound itself. He I did review his most recent infectious disease note which was dated 02/24/18. He saw Dr. Michel Bickers in [place]. 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 Patient is now off of antibiotics and they are continually observing this. There seems to be no change in the past two weeks in my pinion based on what the patient says and what I see today compared to what Dr. Megan Salon likely saw two weeks ago. No fevers, chills, nausea, or vomiting noted at this time. 03/23/18 on evaluation today patient's wound actually appears to be showing signs of improvement which is good news. He is currently still on the Dapsone. He is also working on tapering the prednisone to  get off of this and Dr. Lottie Rater is working with him in this regard. Nonetheless overall I feel like the wound is doing well it does appear based on the infectious disease note that I reviewed from Dr. 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 I did also review the results of his ultrasound of left lower extremity which revealed there was a dentist tissue in the base of the wound without an abscess noted. 04/06/18 on evaluation today the patient's left lateral lower extremity ulcer actually appears to be doing fairly well which is excellent news. There does not appear to be any evidence of infection at this time which is also great news. Overall he still does have a significantly large ulceration although little by little he seems to be making progress. He is down to 10 mg a day of the prednisone. 04/20/18 on evaluation today patient actually appears to be doing excellent at this time in regard to his left lower extremity ulcer. He's making signs of good progress unfortunately this is taking much longer than we would really like to see but nonetheless he is making progress. Fortunately there does not appear to be any evidence of infection at this time. No fevers, chills, nausea, or vomiting noted at this time. The patient has not been using the Santyl due to the cost he hadn't got in this field yet. He's mainly been using the antibiotic ointment topically. Subsequently he also tells me that he really has not been scrubbing in the shower I think this would be helpful again as I told him it doesn't have to be anything too aggressive to even make it believe just enough to keep it free of some of the loose slough and biofilm on the wound surface. 05/11/18 on evaluation today patient's wound appears to be making slow but sure progress in regard to the left lateral lower extremity ulcer. He is  been tolerating the dressing changes without complication. Fortunately there does not appear to be any evidence of infection at this time. He is still just using triple antibiotic ointment along with clobetasol occasionally over the area. He never got the Santyl and really does not seem to intend to in my pinion. 06/01/18 on evaluation today patient appears to be doing a little better in regard to his left lateral lower extremity ulcer. He states that overall he does not feel like he is doing as well with the Dapsone as he did with the prednisone. Nonetheless he sees his dermatologist later today and is gonna talk to them about the possibility of going back on the prednisone. Overall again I believe that the wound would be better if you would utilize Santyl but he really does not seem to be interested in going back to the [place] at this point. He has been using triple antibiotic ointment. 06/15/18 on evaluation today patient's wound actually appears to be doing about the same at this point. Fortunately there is no signs of infection at this time. He has made slight improvements although he continues to not really want to clean the wound bed at this point. He states that he just doesn't mess with it he doesn't want to cause any problems with everything else he has going on. He has been on medication, antibiotics as prescribed by his dermatologist, for a staff infection of his lower extremities which is really drying out now and looking much better he tells me. Fortunately there is no sign of overall infection. 06/29/18 on evaluation today patient appears to be doing well in regard to his left lateral lower surety ulcer all things considering. Fortunately his staff infection seems to be greatly improved compared  to previous. He has no signs of infection and this is drying up quite nicely. He is still the doxycycline for this is no longer on cental, Dapsone, or any of the other medications. His dermatologist  has recommended possibility of an infusion but right now he does not want to proceed with that. 07/13/18 on evaluation today patient appears to be doing about the same in regard to his left lateral lower surety ulcer. Fortunately there's no signs of infection at this time which is great news. Unfortunately he still builds up a significant amount of Slough/biofilm of the surface of the wound he still is not really cleaning this as he should be appropriately. Again I'm able to easily with saline and gauze remove the majority of this on the surface which if you would do this at home would likely be a dramatic improvement for him as far as getting the area to improve. Nonetheless overall I still feel like he is making progress is just very slow. I think Santyl will be of benefit for him as well. Still he has not gotten this as of this point. 07/27/18 on evaluation today patient actually appears to be doing little worse in regards of the erythema around the periwound region of the wound he also tells me that he's been having more drainage currently compared to what he was experiencing last time I saw him. He states not quite as bad as what he had because this was infected previously but nonetheless is still appears to be doing poorly. Fortunately there is no evidence of systemic infection at this point. The patient tells me that he is not going to be able to afford the Santyl. He is still waiting to hear about the infusion therapy with his dermatologist. Apparently she wants an updated colonoscopy first. [name], [name] ([id number]) 08/10/18 on evaluation today patient appears to be doing better in regard to his left lateral lower extremity ulcer. Fortunately he is showing signs of improvement in this regard he's actually been approved for Remicade infusion's as well although this has not been scheduled as of yet. Fortunately there's no signs of active infection at this time in regard to the wound although he is  having some issues with infection of the right lower extremity is been seen as dermatologist for this. Fortunately they are definitely still working with him trying to keep things under control. 09/07/18 on evaluation today patient is actually doing rather well in regard to his left lateral lower extremity ulcer. He notes these actually having some hair grow back on his extremity which is something he has not seen in years. He also tells me that the pain is really not giving them any trouble at this time which is also good news overall she is very pleased with the progress he's using a combination of the mupirocin along with the probate is all mixed. 09/21/18 on evaluation today patient actually appears to be doing fairly well all things considered in regard to his looks from the ulcer. He's been tolerating the dressing changes without complication. Fortunately there's no signs of active infection at this time which is good news he is still on all antibiotics or prevention of the staff infection. He has been on prednisone for time although he states it is gonna contact his dermatologist and see if she put them on a short course due to some irritation that he has going on currently. Fortunately there's no evidence of any overall worsening this is going very slow I  think cental would be something that would be helpful for him although he states that $50 for tube is quite expensive. He therefore is not willing to get that at this point. 10/06/18 on evaluation today patient actually appears to be doing decently well in regard to his left lateral leg ulcer. He's been tolerating the dressing changes without complication. Fortunately there's no signs of active infection at this time. Overall I'm actually rather pleased with the progress he's making although it's slow he doesn't show any signs of infection and he does seem to be making some improvement. I do believe that he may need a switch up and dressings to  try to help this to heal more appropriately and quickly. 10/19/18 on evaluation today patient actually appears to be doing better in regard to his left lateral lower extremity ulcer. This is shown signs of having much less Slough buildup at this point due to the fact he has been using the [employer]. Obviously this is very good news. The overall size of the wound is not dramatically smaller but again the appearance is. 11/02/18 on evaluation today patient actually appears to be doing quite well in regard to his lower [place] ulcer. A lot of the skin around the ulcer is actually somewhat irritating at this point this seems to be more due to the dressing causing irritation from the adhesive that anything else. Fortunately there is no signs of active infection at this time. 11/24/18 on evaluation today patient appears to be doing a little worse in regard to his overall appearance of his lower extremity ulcer. There's more erythema and warmth around the wound unfortunately. He is currently on doxycycline which he has been on for some time. With that being said I'm not sure that seems to be helping with what appears to possibly be an acute cellulitis with regard to his left lower extremity ulcer. No fevers, chills, nausea, or vomiting noted at this time. 12/08/18 on evaluation today patient's wounds actually appears to be doing significantly better compared to his last evaluation. He has been using Santyl along with alternating tripling about appointment as well as the steroid cream seems to be doing quite well and the wound is showing signs of improvement which is excellent news. Fortunately there's no evidence of infection and in fact his culture came back negative with only normal skin flora noted. 12/21/2018 upon evaluation today patient actually appears to be doing excellent with regard to his ulcer. This is actually the best that I have seen it since have been helping to take care of him. It is both smaller as  well as less slough noted on the surface of the wound and seems to be showing signs of good improvement with new skin growing from the edges. He has been using just the triamcinolone he does wonder if he can get a refill of that ointment today. 01/04/2019 upon evaluation today patient actually appears to be doing well with regard to his left lateral lower extremity ulcer. With that being said it does not appear to be that he is doing quite as well as last time as far as progression is concerned. There does not appear to be any signs of infection or significant irritation which is good news. With that being said I do believe that he may benefit from switching to a collagen based dressing based on how clean The wound appears. 01/18/2019 on evaluation today patient actually appears to be doing well with regard to his wound on the  left lower extremity. He is not made a lot of progress compared to where we were previous but nonetheless does seem to be doing okay at this time which is good news. There is no signs of active infection which is also good news. My only concern currently is I do wish we can get him into utilizing the collagen dressing his insurance would not pay for the supplies that we ordered although it appears that he may be able to order this through his supply company that he typically utilizes. This is [employer]. Nonetheless he did try to order it during the office visit today and it appears this did go through. We will see if he can get that it is a different brand but nonetheless he has collagen and I do think will be beneficial. 02/01/2019 on evaluation today patient actually appears to be doing a little worse today in regard to the overall size of his wounds. Fortunately there is no signs of active infection at this time. That is visually. Nonetheless when this is happened before it was due to infection. For that reason were somewhat concerned about that this time as well. 02/08/2019 on  evaluation today patient unfortunately appears to be doing slightly worse with regard to his wound upon evaluation today. Is measuring a little deeper and a little larger unfortunately. I am not really sure exactly what is causing this to enlarge he actually did see his dermatologist she is going to see about initiating Humira for him. Subsequently she also did do steroid injections into the wound itself in the periphery. Nonetheless still nonetheless he seems to be getting a little bit larger he is gone back to just using the steroid cream topically which I think is appropriate. I would say hold off on the collagen for the time being is definitely a good thing to do. Based on the culture results which we finally did get the final result back regarding it shows staph as the bacteria noted again that can be a normal skin bacteria based on the fact however he is having increased drainage and worsening of the wound measurement wise I would go ahead and place him on an antibiotic today I do believe for this. 02/15/2019 on evaluation today patient actually appears to be doing somewhat better in regard to his ulcer. There is no signs of worsening at this time I did review his culture results which showed evidence of Staphylococcus aureus but not MRSA. Again this could just be more related to the normal skin bacteria although he states the drainage has slowed down quite a bit he may have had a mild infection not just colonization. And was much smaller and then since around10/04/2019 on evaluation today patient appears to be doing unfortunately worse as far as the size of the wound. I really feel like that this is steadily getting larger again it had been doing excellent right at the beginning of September we have seen a steady increase in the area of the wound it is almost 2-1/2 times the size it was on September 1. Obviously this is a bad trend this is not wanting to see. For that reason we went back to using  just the topical triamcinolone cream which does seem to help with inflammation. I checked him for bacteria by way of culture and nothing showed positive there. I am considering giving him a short course of a tapering steroid HAWLEY, MICHEL ([id number]) today to see if that is can be beneficial for  him. The patient is in agreement with giving that a try. 03/08/2019 on evaluation today patient appears to be doing very well in comparison to last evaluation with regard to his lower extremity ulcer. This is showing signs of less inflammation and actually measuring slightly smaller compared to last time every other week over the past month and a half he has been measuring larger larger larger. Nonetheless I do believe that the issue has been inflammation the prednisone does seem to have been beneficial for him which is good news. No fevers, chills, nausea, vomiting, or diarrhea. 03/22/2019 on evaluation today patient appears to be doing about the same with regard to his leg ulcer. He has been tolerating the dressing changes without complication. With that being said the wound seems to be mostly arrested at its current size but really is not making any progress except for when we prescribed the prednisone. He did show some signs of dropping as far as the overall size of the wound during that interval week. Nonetheless this is something he is not on long-term at this point and unfortunately I think he is getting need either this or else the Humira which his dermatologist has discussed try to get approval for. With that being said he will be seeing his dermatologist on the 11th of this month that is November. 04/19/2019 on evaluation today patient appears to be doing really about the same the wound is measuring slightly larger compared to last time I saw him. He has not been into the office since November 2 due to the fact that he unfortunately had Covid as that his entire family. He tells me that it was  rough but they did pull-through and he seems to be doing much better. Fortunately there is no signs of active infection at this time. No fevers, chills, nausea, vomiting, or diarrhea. 05/10/2019 on evaluation today patient unfortunately appears to be doing significantly worse as compared to last time I saw him. He does tell me that he has had his first dose of Humira and actually is scheduled to get the next one in the upcoming week. With that being said he tells me also that in the past several days he has been having a lot of issues with green drainage she showed me a picture this is more blue-green in color. He is also been having issues with increased sloughy buildup and the wound does appear to be larger today. Obviously this is not the direction that we want everything to take based on the starting of his Humira. Nonetheless I think this is definitely a result of likely infection and to be honest I think this is probably Pseudomonas causing the infection based on what I am seeing. 05/24/2019 on evaluation today patient unfortunately appears to be doing significantly worse compared to his prior evaluation with me 2 weeks ago. I did review his culture results which showed that he does have Staph aureus as well as Pseudomonas noted on the culture. Nonetheless the Levaquin that I prescribed for him does not appear to have been appropriate and in fact he tells me he is no longer experiencing the green drainage and discharge that he had at the last visit. Fortunately there is no signs of active infection at this time which is good news although the wound has significantly worsened it in fact is much deeper than it was previous. We have been utilizing up to this point triamcinolone ointment as the prescription topical of choice but at this time  I really feel like that the wound is getting need to be packed in order to appropriately manage this due to the deeper nature of the wound. Therefore something  along the lines of an alginate dressing may be more appropriate. 05/31/2019 upon inspection today patient's wound actually showed signs of doing poorly at this point. Unfortunately he just does not seem to be making any good progress despite what we have tried. He actually did go ahead and pick up the Cipro and start taking that as he was noticing more green drainage he had previously completed the Levaquin that I prescribed for him as well. Nonetheless he missed his appointment for the seventh last week on Wednesday with the wound care center and [facility] where his dermatologist referred him. Obviously I do think a second opinion would be helpful at this point especially in light of the fact that the patient seems to be doing so poorly despite the fact that we have tried everything that I really know how at this point. The only thing that ever seems to have helped him in the past is when he was on high doses of continual steroids that did seem to make a difference for him. 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 06/07/2019 upon evaluation today patient unfortunately appears to be doing worse yet again with regard to his wound. In fact I am starting to question whether or not he may have a fluid pocket in the muscle at this point based on the bulging and the soft appearance to the central portion of the muscle area. There is not anything draining from the muscle itself at this time which is good news but nonetheless the wound is expanding. I am not really seeing any results of the Humira as far as overall wound progression based on what I am seeing at this point. The patient has been referred for second opinion with regard to his wound to the [facility] wound care center by his dermatologist which I definitely am not in opposition to.  Unfortunately we tried multiple dressings in the past including collagen, alginate, and at one point even Hydrofera Blue. With that being said he is never really used it for any significant amount of time due to the fact that he often complains of pain associated with these dressings and then will go back to either using the Santyl which she has done intermittently or more frequently the triamcinolone. He is also using his own compression stockings. We have wrapped him in the past but again that was something else that he really was not a big fan of. 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 He seemed to do much better when he was on the steroids although honestly I understand that the steroids are not necessarily the best medication to be on long-term obviously 06/14/2019 on evaluation today patient appears to be doing actually a little bit better with regard to the overall appearance with  his leg. Unfortunately he does continue to have issues with what appears to be some fluid underneath the muscle although he did see the wound specialty center at [facility] last week their main goals were to see about infusion therapy in place of the Humira as they feel like that is not quite strong enough. They also recommended that we continue with the treatment otherwise as we are they felt like that was appropriate and they are okay with him continuing to follow-up here with [place] in that regard. With that being said they are also sending him to the vein specialist there to see about vein stripping and if that would be of benefit for him. Subsequently they also did not really address whether or not an  ultrasound of the muscle area to see if there is anything that needs to be addressed here would be appropriate or not. For that reason I discussed this with him last week I think we may proceed down that road at this point. 06/21/2019 upon evaluation today patient's wound actually appears to be doing slightly better compared to previous evaluations. I do believe that he has made a difference with regard to the progression here with the use of oral steroids. Again in the past has been the only thing that is really calm things down. He does tell me that from [facility] is gotten a good news from there that there are no further vein stripping that is necessary at this point. I do not have that available for review today although the patient did relay this to me. He also did obtain and have the ultrasound of the wound completed which I did sign off on today. It does appear that there is no fluid collection under the muscle this is likely then just edematous tissue in general. That is also good news. Overall I still believe the inflammation is the main issue here. He did inquire about the possibility of a wound VAC again with the muscle protruding like it is I am not really sure whether the wound VAC is necessarily ideal or not. That is something we will have to consider although I do believe he may need compression wrapping to try to help with edema control which could potentially be of benefit. 06/28/2019 on evaluation today patient appears to be doing slightly better measurement wise although this is not terribly smaller he least seems to be trending towards that direction. With that being said he still seems to have purulent drainage noted in the wound bed at this time. He has been on Levaquin followed by Cipro over the past month. Unfortunately he still seems to have some issues with active infection at this time. I did perform a culture last week in order to evaluate and see if indeed there was still anything  going on. Subsequently the culture did come back [name], [name] ([id number]) showing Pseudomonas which is consistent with the drainage has been having which is blue-green in color. He also has had an odor that again was somewhat consistent with Pseudomonas as well. Long story short it appears that the culture showed an intermediate finding with regard to how well the Cipro will work for the Pseudomonas infection. Subsequently being that he does not seem to be clearing up and at best what we are doing is just keeping this at [place] I think he may need to see infectious disease to discuss IV antibiotic options. 07/05/2019 upon evaluation today patient appears to be doing okay in regard to his leg  ulcer. He has been tolerating the dressing changes at this point without complication. Fortunately there is no signs of active infection at this time which is good news. No fevers, chills, nausea, vomiting, or diarrhea. With that being said he does have an appointment with infectious disease tomorrow and his primary care on Wednesday. Again the reason for the infectious disease referral was due to the fact that he did not seem to be fully resolving with the use of oral antibiotics and therefore we were thinking that IV antibiotic therapy may be necessary secondary to the fact that there was an intermediate finding for how effective the Cipro may be. Nonetheless again he has been having a lot of purulent and even green drainage. Fortunately right now that seems to have calmed down over the past week with the reinitiation of the oral antibiotic. Nonetheless we will see what Dr. Megan Salon has to say. 07/12/2019 upon evaluation today patient appears to be doing about the same at this point in regard to his left lower extremity ulcer. Fortunately there is no signs of active infection at this time which is good news I do believe the Levaquin has been beneficial I did review Dr. Hale Bogus note and to be honest I agree that  the patient's leg does appear to be doing better currently. What we found in the past as he does not seem to really completely resolve he will stop the antibiotic and then subsequently things will revert back to having issues with blue-green drainage, increased pain, and overall worsening in general. Obviously that is the reason I sent him back to infectious disease. 07/19/2019 upon evaluation today patient appears to be doing roughly the same in size there is really no dramatic improvement. He has started back on the Levaquin at this point and though he seems to be doing okay he did still have a lot of blue/green drainage noted on evaluation today unfortunately. I think that this is still indicative more likely of a Pseudomonas infection as previously noted and again he does see Dr. Megan Salon in just a couple of days. I do not know that were really able to effectively clear this with just oral antibiotics alone based on what I am seeing currently. Nonetheless we are still continue to try to manage as best we can with regard to the patient and his wound. I do think the wrap was helpful in decreasing the edema which is excellent news. No fevers, chills, nausea, vomiting, or diarrhea. 07/26/2019 upon evaluation today patient appears to be doing slightly better with regard to the overall appearance of the muscle there is no dark discoloration centrally. Fortunately there is no signs of active infection at this time. No fevers, chills, nausea, vomiting, or diarrhea. Patient's wound bed currently the patient did have an appointment with Dr. Megan Salon at infectious disease last week. With that being said Dr. Megan Salon the patient states was still somewhat hesitant about put him on any IV antibiotics he wanted [place] to repeat cultures today and then see where things go going forward. He does look like Dr. Megan Salon because of some improvement the patient did have with the Levaquin wanted [place] to see about repeating  cultures. If it indeed grows the Pseudomonas again then he recommended a possibility of considering a PICC line placement and IV antibiotic therapy. He plans to see the patient back in 1 to 2 weeks. 08/02/2019 upon evaluation today patient appears to be doing poorly with regard to his left lower extremity. We did  get the results of his culture back it shows that he is still showing evidence of Pseudomonas which is consistent with the purulent/blue-green drainage that he has currently. Subsequently the culture also shows that he now is showing resistance to the oral fluoroquinolones which is unfortunate as that was really the only thing to treat the infection prior. I do believe that he is looking like this is going require IV antibiotic therapy to get this under control. Fortunately there is no signs of systemic infection at this time which is good news. The patient does see Dr. Megan Salon tomorrow. 08/09/2019 upon evaluation today patient appears to be doing better with regard to his left lower extremity ulcer in regard to the overall appearance. He is currently on IV antibiotic therapy. As ordered by Dr. Megan Salon. Currently the patient is on ceftazidime which she is going to take for the next 2 weeks and then follow-up for 4 to 5-week appointment with Dr. Megan Salon. The patient started this this past Friday symptoms have not for a total of 3 days currently in full. 08/16/2019 upon evaluation today patient's wound actually does show muscle in the base of the wound but in general does appear to be much better as far as the overall evidence of infection is concerned. In fact I feel like this is for the most part cleared up he still on the IV antibiotics he has not completed the full course yet but I think he is doing much better which is excellent news. 08/23/2019 upon evaluation today patient appears to be doing about the same with regard to his wound at this point. He tells me that he still has pain  unfortunately. Fortunately there is no evidence of systemic infection at this time which is great news. There is significant muscle protrusion. 09/13/19 upon evaluation today patient appears to be doing about the same in regard to his leg unfortunately. He still has a lot of drainage coming from the ulceration there is still muscle exposed. With that being said the patient's last wound culture still showed an intermediate finding with regard to the Pseudomonas he still having the bluish/green drainage as well. Overall I do not know that the wound has completely cleared of infection at this point. Fortunately there is no signs of active infection systemically at this point which is good news. 09/20/2019 upon evaluation today patient's wound actually appears to be doing about the same based on what I am seeing currently. I do not see any signs of systemic infection he still does have evidence of some local infection and drainage. He did see Dr. Megan Salon last week and Dr. Megan Salon states that he probably does need a different IV antibiotic although he does not want to put him on this until the patient begins the Remicade infusion which is actually scheduled for about 10 days out from today on 13 May. Following that time Dr. Megan Salon is good to see him back and then will evaluate the feasibility of starting him on the IV antibiotic therapy once again at that point. I do not disagree with this plan I do believe as Dr. Megan Salon stated in his note that I reviewed today that the patient's issue is multifactorial with the pyoderma being 1 aspect of this that were hoping the Remicade will be helpful for her. In the meantime I think the gentamicin is, helping to keep things under decent okay control in regard to the ulcer. 09/27/2019 upon evaluation today patient appears to be doing about the same  with regard to his wound still there is a lot of muscle exposure though he does have some hyper granulation tissue noted  around the edge and actually some granulation tissue starting to form over the muscle which is actually good news. Fortunately there is no evidence of active infection which is also good news. His pain is less at this point. 5/21; this is a patient I have not seen in a long time. 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 He has a large wound on the left lateral leg with protruding muscle. He comes in the clinic today showing the same area on his left medial ankle. He says there is been a spot there for some time although we have not previously defined this. Today he has a clearly defined area with slight amount of skin breakdown surrounded by raised areas with a purplish hue in color. This is not painful he says it is irritated. 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 He still has muscle protruding from the lateral portion of his left leg but fortunately the new area that they were concerned about at his last visit does not appear to have opened at this point. He is currently on Remicade infusions and seems to be doing better in my opinion in fact the wound itself seems to be overall much better. The purplish discoloration that he did have seems to have resolved and I think that is a good sign that hopefully the Remicade is doing its job. He does [name], [name] ([id number]) have some biofilm noted over the surface of the wound. 11/01/2019 on evaluation today patient's wound actually appears to be doing excellent at this time. Fortunately there is no evidence of active infection and overall I feel like he is making great progress. The Remicade seems to be due excellent job in my opinion. 11/08/19 evaluation today vision actually appears to be doing quite well with regard to his weight ulcer. He's been tolerating dressing changes without complication.  Fortunately there is no evidence of infection. No fevers, chills, nausea, or vomiting noted at this time. Overall states that is having more itching than pain which is actually a good sign in my opinion. 12/13/2019 upon evaluation today patient appears to be doing well today with regard to his wound. He has been tolerating the dressing changes without complication. Fortunately there is no sign of active infection at this time. No fevers, chills, nausea, vomiting, or diarrhea. Overall I feel like the infusion therapy has been very beneficial for him. 01/06/2020 on evaluation today patient appears to be doing well with regard to his wound. This is measuring smaller and actually looks to be doing better. Fortunately there is no signs of active infection at this point. No fevers, chills, nausea, vomiting, or diarrhea. With that being said he does still have the blue-green drainage but this does not seem to be causing any significant issues currently. He has been using the gentamicin that does seem to be keeping things under decent control at this point. He goes later this morning for his next infusion therapy for the pyoderma which seems to also be very beneficial. 02/07/2020 on evaluation today patient appears to be doing about the same in regard to his wounds currently. Fortunately there is no signs of active infection systemically he does still have evidence of local infection still using gentamicin. He also is showing some signs  of improvement albeit slowly I do feel like we are making some progress here. 02/21/2020 upon evaluation today patient appears to be making some signs of improvement the wound is measuring a little bit smaller which is great news and overall I am very pleased with where he stands currently. He is going to be having infusion therapy treatment on the 15th of this month. Fortunately there is no signs of active infection at this time. 03/13/2020 I do believe patient's wound is  actually showing some signs of improvement here which is great news. He has continue with the infusion therapy through rheumatology/dermatology at [facility]. That does seem to be beneficial. I still think he gets as much benefit from this as he did from the prednisone initially but nonetheless obviously this is less harsh on his body that the prednisone as far as they are concerned. 03/31/2020 on evaluation today patient's wound actually showing signs of some pretty good improvement in regard to the overall appearance of the wound bed. There is still muscle exposed though he does have some epithelial growth around the edges of the wound. Fortunately there is no signs of active infection at this time. No fevers, chills, nausea, vomiting, or diarrhea. 04/24/2020 upon evaluation today patient appears to be doing about the same in regard to his leg ulcer. He has been tolerating the dressing changes without complication. Fortunately there is no signs of active infection at this time. No fevers, chills, nausea, vomiting, or diarrhea. With that being said he still has a lot of irritation from the bandaging around the edges of the wound. We did discuss today the possibility of a referral to plastic surgery. 05/22/2020 on evaluation today patient appears to be doing well with regard to his wounds all things considered. He has not been able to get the Chantix apparently there is a recall nurse that I was unaware of put out by [employer] involuntarily. Nonetheless for now I am and I have to do some research into what may be the best option for him to help with quitting in regard to smoking and we discussed that today. 06/26/2020 upon evaluation today patient appears to be doing well with regard to his wound from the standpoint of infection I do not see any signs of infection at this point. With that being said unfortunately he is still continuing to have issues with muscle exposure and again he is not having a whole lot of new  skin growth unfortunately. There does not appear to be any signs of active infection at this time. No fevers, chills, nausea, vomiting, or diarrhea. 07/10/2020 upon evaluation today patient appears to be doing a little bit more poorly currently compared to where he was previous. I am concerned currently about an active infection that may be getting worse especially in light of the increased size and tenderness of the wound bed. No fevers, chills, nausea, vomiting, or diarrhea. 07/24/2020 upon evaluation today patient appears to be doing poorly in regard to his leg ulcer. He has been tolerating the dressing changes without complication but unfortunately is having a lot of discomfort. 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 Subsequently I think he is going require possibly IV antibiotics to get this under control. I am very concerned about the severity of his infection and the amount of discomfort he is having. 07/31/2020 upon evaluation today patient appears to be doing about the same in regard to his leg wound. He did see  Dr. Megan Salon and Dr. Megan Salon is actually going to start him on IV antibiotics. He goes for the PICC line tomorrow. With that being said there do not have that run for 2 weeks and then see how things are doing and depending on how he is progressing they may extend that a little longer. Nonetheless I am glad this is getting ready to be in place and definitely feel it may help the patient. In the meantime is been using mainly triamcinolone to the wound bed has an anti-inflammatory. 08/07/2020 on evaluation today patient appears to be doing well with regard to his wound compared even last week. In the interim he has gotten the PICC line placed and overall this seems to be doing excellent. There does not appear to be any evidence of infection which is great news systemically although locally of course has had the  infection this appears to be improving with the use of the antibiotics. 08/14/2020 upon evaluation today patient's wound actually showing signs of excellent improvement. Overall the irritation has significantly improved the drainage is back down to more of a normal level and his pain is really pretty much nonexistent compared to what it was. Obviously I think that this is significantly improved secondary to the IV antibiotic therapy which has made all the difference in the world. Again he had a resistant form of Pseudomonas for which oral antibiotics just was not cutting it. Nonetheless I do think that still we need to consider the possibility of a surgical closure for this wound is been open so long and to be honest with muscle exposed I think this can be very hard to get this to close outside of this although definitely were still working to try to do what we can in that regard. 08/21/2020 upon evaluation today patient appears to be doing very well with regard to his wounds on the left lateral lower extremity/calf area. Fortunately there does not appear to be signs of active infection which is great news and overall very pleased with where things stand today. He is actually wrapping up his treatment with IV antibiotics tomorrow. After that we will see where things go from there. 08/28/2020 upon evaluation today patient appears to be doing decently well with regard to his leg ulcer. There does not appear to be any signs of Begley, Tiernan ([id number]) active infection which is great news and overall very pleased with where things stand today. No fevers, chills, nausea, vomiting, or diarrhea. 09/18/2020 upon evaluation today patient appears to be doing well with regard to his infection which I feel like is better. Unfortunately he is not doing as well with regard to the overall size of the wound which is not nearly as good at this point. I feel like that he may be having an issue here with the pyoderma being  somewhat out of control. I think that he may benefit from potentially going back and talking to the dermatologist about what to do from the pyoderma standpoint. I am not certain if the infusions are helping nearly as much is what the prednisone did in the past. 10/02/2020 upon evaluation today patient appears to be doing well with regard to his leg ulcer. He did go to the [facility] nurse. Unfortunately they feel like there is a 10% chance that most that he would be able to heal and that the skin graft would take. Obviously this has led him to not be able to go down that path as far as treatment is  concerned. Nonetheless he does seem to be doing a little bit better with the prednisone that I gave him last time. I think that he may need to discuss with dermatology the possibility of long-term prednisone as that seems to be what is most helpful for him to be perfectly honest. I am not sure the Remicade is really doing the job. 10/17/2020 upon evaluation today patient appears to be doing a little better in regard to his wound. In fact the case has been since we did the prednisone on May 2 for him that we have noticed a little bit of improvement each time we have seen a size wise as well as appearance wise as well as pain wise. I think the prednisone has had a greater effect then the infusion therapy has to be perfectly honest. With that being said the patient also feels significantly better compared to what he was previous. All of this is good news but nonetheless I am still concerned about the fact that again we are really not set up to long-term manage him as far as prednisone is concerned. Obviously there are things that you need to be watched I completely understand the risk of prednisone usage as well. 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 With all that being said I do believe that we can give him another round of the prednisone which she is requesting  today because of the improvement that he seen since we did that first round. 10/30/2020 upon evaluation today patient's wound actually is showing signs of doing quite well. There does not appear to be any evidence of infection which is great news and overall very pleased with where things stand today. No fevers, chills, nausea, vomiting, or diarrhea. He tells me that the prednisone still has seem to have helped he wonders if we can extend that for just a little bit longer. He did not have the appointment with a dermatologist although he did have an infusion appointment last Friday. That was at [facility]. With that being said he tells me he could not do both that as well as the appointment with the physician on the same day therefore that is can have to be rescheduled. I really want to see if there is anything they feel like that could be done differently to try to help this out as I am not really certain that the infusions are helping significantly here. 11/13/2020 upon evaluation today patient unfortunately appears to be doing somewhat poorly in regard to his wound I feel like this is actually worsening from the standpoint of the pyoderma spreading. I still feel like that he may need something different as far as trying to manage this going forward. Again we did the prednisone unfortunately his blood sugars are not doing so well because of this. Nonetheless I believe that the patient likely needs to try topical steroid. We have done triamcinolone for a while I think going with something stronger such as clobetasol could be beneficial again this is not something I do lightly I discussed this with the patient that again this does not normally put underneath an occlusive dressing. Nonetheless I think a thin film as such could help with some of the stronger anti-inflammatory effects. We discussed this today. He would like to try to give this a trial for the next couple weeks. I definitely think that is something  that we can do. Evaluate7/03/2021 and today patient's wound bed actually  showed signs of doing really about the same. There was a little expansion of the size of the wound and that leading edge that we done looking out although the clobetasol does seem to have slowed this down a bit in my opinion. There is just 1 small area that still seems to be progressing based on what I see. Nonetheless I am concerned about the fact this does not seem to be improving if anything seems to be doing a little bit worse. I do not know that the infusions are really helping him much as next infusion is August 5 his appointment with dermatology is July 25. Either way I really think that we need to have a conversation potentially about this and I am actually going to see if I can talk with Dr. Lillia Carmel in order to see where things stand as well. 12/11/2020 upon evaluation today patient appears to be doing worse in regard to his leg ulcer. Unfortunately I just do not think this is making the progress that I would like to see at this point. Honestly he does have an appointment with dermatology and this is in 2 days. I am wondering what they may have to offer to help with this. Right now what I am seeing is that he is continuing to show signs of worsening little by little. Obviously that is not great at all. Is the exact opposite of what we are looking for. 12/18/2020 upon evaluation today patient appears to be doing a little better in regard to his wound. The dermatologist actually did do some steroid injections into the wound which does seem to have been beneficial in my opinion. That was on the 25th already this looks a little better to me than last time I saw him. With that being said we did do a culture and this did show that he has Staph aureus noted in abundance in the wound. With that being said I do think that getting him on an oral antibiotic would be appropriate as well. Also think we can compression wrap and  this will make a difference as well. 12/28/2020 upon evaluation today patient's wound is actually showing signs of doing much better. I do believe the compression wrap is helping he has a lot of drainage but to be honest I think that the compression is helping to some degree in this regard as well as not draining through which is also good news. No fevers, chills, nausea, vomiting, or diarrhea. 01/04/2021 upon evaluation today patient appears to be doing well with regard to his wound. Overall things seem to be doing quite well. He did have a little bit of reaction to the CarboFlex Sorbact he will be using that any longer. With that being said he is controlled as far as the drainage is concerned overall and seems to be doing quite well. I do not see any signs of active infection at this time which is great news. No fevers, chills, nausea, vomiting, or diarrhea. 01/11/2021 upon evaluation today patient appears to be doing well with regard to his wounds. He has been tolerating the dressing changes without complication. Fortunately there does not appear to be any signs of active infection at this time which is great news. Overall I am extremely pleased with where we stand currently. No fevers, chills, nausea, vomiting, or diarrhea. Where using clobetasol in the wound bed he has a lot of new skin growth which is awesome as well. 01/18/2021 upon evaluation today patient appears to be doing  very well in regard to his leg ulcer. He has been tolerating the dressing changes without complication. Fortunately there does not appear to be any signs of active infection which is great news. In general I think that he is making excellent progress 01/25/2021 upon evaluation today patient appears to be doing well with regard to his wound on the leg. I am actually extremely pleased with where things stand today. There does not appear to be any signs of active infection which is great news and overall I think that we are  definitely headed in the appropriate direction based on what I am seeing currently. There does not appear to be any signs of active infection also excellent news. 02/06/2021 upon evaluation today patient appears to be doing well with regard to his wound. Overall visually this is showing signs of significant Masse, Zachariah ([id number]) improvement which is great news. I do not see any signs of active infection systemically which is great even locally I do not think that we are seeing any major complications here. We did do fluorescence imaging with the [employer] today. The patient does have some odor and drainage noted and again this is something that I think would benefit him to probably come more frequently for nurse visits. 02/19/2021 upon evaluation today patient actually appears to be doing quite well in regard to his wound. He has been tolerating the dressing changes without complication and overall I think that this is making excellent progress. I do not see any evidence of active infection at this point which is great news as well. No fevers, chills, nausea, vomiting, or diarrhea. 10/10; wound is made nice progress healthy granulation with a nice rim of epithelialization which seems to be expanding even from last week he has a deeper area in the inferior part of the more distal part of the wound with not quite as healthy as surface. This area will need to be followed. Using clobetasol and Hydrofera Blue 03/05/2021 upon evaluation today patient appears to be doing very well in regard to his leg ulcer. He has been tolerating dressing changes without complication. Fortunately there does not appear to be any signs of active infection which is great news and overall I am extremely pleased with where we stand currently. 03/12/2021 upon evaluation today patient appears to be doing well with regard to his wound in fact this is extremely extremely good based on what we are seeing today there does not  appear to be any signs of active infection and overall I think that he is doing awesome from the standpoint of healing in general. I am extremely pleased with how things seem to be progressing with regard to this pyoderma. Clobetasol has done wonders for him. I think the compression wrapping has also been of great benefit. 03/19/2021 upon evaluation today patient appears to be doing well with regard to his wound. He is tolerating the dressing changes without complication. In fact I feel like that he is actually making excellent progress at this point based on what I am seeing. No fevers, chills, nausea, vomiting, or diarrhea. 03/26/2021 upon evaluation today patient appears to be doing well with regard to his wound. This again is measuring smaller and looking better. Again the progress is slow but nonetheless continual with what we have been seeing. I do believe that the current plan is doing awesome for him. 04/09/2021 upon evaluation today patient appears to be doing well with regard to his leg ulcer. This is showing signs of  excellent improvement the muscle is completely closed over and there does not appear to be any evidence of inflammation at this point his drainage is significantly improved. Overall I think that he would be a good candidate for looking into a skin substitute at this point as well. We will get a look into some approvals in that regard. Potentially TheraSkin as well as Apligraf could both be considered just depending on insurance coverage. 04/16/2021 upon evaluation today patient appears to be doing well at this point. He has been approved for the Apligraf which we could definitely order although I would like to try to get the TheraSkin approved if at all possible. I did fax notes into them today and I Georgina Peer try to give a call as well. Overall the wound appears to be doing decently well today. 04/20/2021 upon evaluation today patient actually appears to be doing quite well in  regard to his wound with that being said we are trying to see what we can do about speeding up the healing process. For that reason we did discuss the possibility of a skin substitute. We got the Apligraf approved. For that reason we will get a go ahead and see what we can do with the Apligraf at this point. I am still trying to get the TheraSkin approved but I have not heard anything from the insurance company yet I have called and talked to them earlier in the week and to be honest this was about half an hour that I spent on the phone they told me I would hear something within 10-15 business days. 04/27/2021 upon evaluation today patient appears to be doing excellent in regard to his wound. I do believe the Apligraf has been beneficial. With that being said he has had a little bit of increased pain has been a little bit concerned. I do want to go ahead and apply his steroid cream today the clobetasol and then put the Apligraf over I just do not think I want to risk not putting the clobetasol on based on what is been going on here currently. Patient voiced understanding. 05/04/2021 upon evaluation today patient is making excellent improvement. Overall there is definitely decrease in the size of the wound today and I am very pleased in that regard. I do not see any signs of active infection locally nor systemically at this point. No fevers, chills, nausea, vomiting, or diarrhea. 05/11/2021 upon evaluation today patient appears to be doing well with regard to his wound. He has been tolerating Apligraf's without complication and is making excellent progress this is application #4 today. 12/30; patient here for Apligraf application. Appears to be doing well small wound there has been a lot of healing 05/28/2021 upon evaluation today patient appears to be doing well with regard to his wound. Has been tolerating the dressing changes without complication. Fortunately I do not see any signs of active infection  locally nor systemically at this point which is great news. He is done with the Apligraf and the first round and overall this has filled in quite nicely I am not even certain he needs any additional at the moment. I would actually try to go back to clobetasol with [facility] which previously was doing well for him. 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 He is using clobetasol and Hydrofera Blue. Surface area of the wound is smaller and the surface looks very healthy. 06/11/2021 upon evaluation today patient actually appears to  be showing signs of good improvement which is great news. Fortunately I do not see any signs of active infection at this time which is also excellent news. I do believe that the patient is doing well with the clobetasol and Hydrofera Blue. He does have some irritation and itching around the rest of the leg will use some triamcinolone over this area. 06/18/2021 upon evaluation today patient appears to be doing well with regard to his wound. He is showing signs of excellent improvement and overall I am extremely pleased with where we stand today. We are moving in the right direction. 06/25/2021 upon evaluation today patient appears to be doing well with regard to his wound. In fact this is doing also not showing signs of excellent improvement overall very pleased with where we stand today. I do not see any evidence of active infection locally or systemically at this time which is also great news. 07/02/2021 upon evaluation patient's wound bed actually showed signs of good granulation and epithelization at this point. And this is measuring significantly smaller and overall I am extremely pleased with where we stand today. No fevers, chills, nausea, vomiting, or diarrhea. 07/09/2021 upon evaluation patient's wound bed actually showed signs of good granulation and epithelization at this point. Fortunately there does not appear to be  any evidence of active infection locally or systemically which is great news and overall I am extremely pleased with where we REYNOLDO, MAINER ([id number]) stand at this point. 07/16/2021 upon evaluation today patient appears to be doing well with regard to his wound all things considered although it is maybe a little bit larger than what its been. This does have me a little concerned. Actually question about whether anything is changed and initially he told me know. In fact it was not until the end of the visit that he actually did mention that he had missed his infusion in January. This very well may be what is because the difference is also seeing currently. That definitely has seem to been helping more recently. 07/23/2021 upon evaluation today patient's wound is yet again larger despite the switch to a collagen which was not beneficial. Subsequently I am going to at this point check on a couple things. First and foremost I do want to go ahead and do a culture. I think that there is a chance he could have a low-lying infection that may be part of the issue here. Subsequently I am also probably can go ahead and place him on Bactrim DS which I think is a good option and has done well with them in the past. Again if this is an infection that should hopefully help to turn things around. With all that being said I do believe that the patient should hopefully be able to turn this around with reinitiation of the infusion therapy which will be going on this week and subsequently getting on the antibiotic. 3/13; patient presents for follow-up. He has no issues or complaints today. He states he is currently on oral antibiotics for previous culture done in the office. 08/06/2021 upon evaluation today patient appears to be doing well with regard to his wound. I am actually seeing signs of improvement which is great news. I think that he is on a better track he did receive his infusion as well which is great news and  overall I think that this should hopefully get him back on track as far as healing is concerned. He is not having any pain and I do  not see any signs of inflammation which is great news. 08/13/2021 upon evaluation today patient appears to be doing excellent in regard to his wound on the leg. He has been tolerating the dressing changes without complication. Fortunately I do not see any signs of infection I do believe he is making good progress here and I think her back on track overall the tissue is greatly improved compared to what we have been. 08/20/2021 upon evaluation today patient appears to be doing excellent in regard to his wound. He has been tolerating the dressing changes without complication. Fortunately I do not see any evidence of infection at this time locally or systemically which is great news and overall very pleased with where we stand. 08-31-2021 upon evaluation today patient appears to be doing excellent in regard to his wound. Overall even with the vacation and extra walking he seems to have really done well. Fortunately I do not see any evidence of active infection locally or systemically which is great news and overall I think we are headed in the right direction. 09-10-2021 upon evaluation today patient's wound is showing signs of improvement. This is a small improvement but nonetheless we seem to be making progress here. I am very pleased in that regard. There does not appear to be any signs of infection and overall things are doing quite nicely. Upon inspection patient's wound bed actually showed signs of good granulation and epithelization at this point. There was some irritation around the edges of the wound where the good skin was it almost appears that something was rubbing or else is was due from drainage and irritation. Nonetheless I am going to see about putting a little bit of zinc just around the wound opening in order to protect the skin from being damaged. The patient  is in agreement with that plan. 5/8; its been a while since I have seen this wound and it looks considerably better although still with some depth. Per our intake nurse the dimensions have improved patient has been using a thin layer of clobetasol, Hydrofera Blue under 4-layer compression. He appears to be making progress 10-01-2021 upon evaluation today patient appears to be doing well with regard to his wound although it is somewhat stalled I feel like were basically at a standstill here. He has previously had Apligraf which did very well for him to be honest. I do believe that he may benefit from a repeat of the Apligraf. In fact it got so small at that point but I do not think we even used all of the applications that were recommended as this had improved to such a degree. Nonetheless I do believe that the patient currently could benefit from repeat treatment with the Apligraf due to the improvement this that we did see. He is not opposed to this and subsequently working to look into getting approval to reinstitute that treatment. 10-08-2021 upon evaluation today patient appears to be doing well with regard to his wound. Overall I feel like he is doing a little bit better from the standpoint of irritation at this time which is great news. I do not see any signs of active infection locally or systemically which is great news as well. No fevers, chills, nausea, vomiting, or diarrhea. 10-12-2021 upon evaluation today patient appears to be doing well with regard to his wound stable although we are can initiate treatment with Apligraf today. I am hoping this will stimulate things to improve. Patient is in agreement with that plan. 10-19-2028  upon evaluation today patient appears to be doing well with regard to his wound. He has been tolerating the dressing changes without complication. Fortunately there does not appear to be any evidence of active infection locally or systemically at this time which is  great news. No fevers, chills, nausea, vomiting, or diarrhea. I do believe the Apligraf has improved the overall appearance of the wound bed receiving some definite improvements here. 10-26-2021 upon evaluation today patient's wound is actually shown some signs of really good improvement I am very pleased in this regard. There is a little bit of slough and biofilm buildup that is stuck to the surface of the wound I am getting very lightly debrided in order to clear this away and then subsequently will get a continue with the Apligraf application today. This is #3. 11-09-2021 upon evaluation today patient appears to be doing well currently in regard to his wound. This is actually showing signs of significant improvement which is great news and overall I think the Apligraf is doing an amazing job. This wound is significantly smaller compared to the last time I saw him. This will be Apligraf application #4. 11-18-2195 upon evaluation today patient appears to be doing well currently in regard to his wound he is actually showing signs of excellent improvement which is great news and overall I am extremely pleased with where we stand today. I do not see any evidence of active infection locally or systemically at this time which is great news. No fevers, chills, nausea, vomiting, or diarrhea. I believe the Apligraf is doing an awesome job and in fact I [place] see about getting an extension of application which hopefully should not be a problem. Especially in light of how much improvement he has had with regards to the size of the wound with treatment. This is application #5 today. [name], HOPFENSPERGER ([id number]) Objective Constitutional Well-nourished and well-hydrated in no acute distress. Vitals Time Taken: 8:20 AM, Height: 71 in, Weight: 338 lbs, BMI: 47.1, Temperature: 97.7 F, Pulse: 92 bpm, Respiratory Rate: 16 breaths/min, Blood Pressure: 139/82 mmHg. Respiratory normal breathing without  difficulty. Psychiatric this patient is able to make decisions and demonstrates good insight into disease process. Alert and Oriented x 3. pleasant and cooperative. General Notes: Upon inspection patient's wound bed actually showed signs of good granulation and epithelization at this point. Fortunately I do not see any signs of active infection locally or systemically which is great news and overall I think that we are headed in the right direction. No fevers, chills, nausea, vomiting, or diarrhea. Integumentary (Hair, Skin) Wound #1 status is Open. Original cause of wound was Gradually Appeared. The date acquired was: 11/18/2015. The wound has been in treatment 259 weeks. The wound is located on the Left,Lateral Lower Leg. The wound measures 2.5cm length x 2cm width x 0.2cm depth; 3.927cm^2 area and 0.785cm^3 volume. There is Fat Layer (Subcutaneous Tissue) exposed. There is no tunneling or undermining noted. There is a medium amount of serosanguineous drainage noted. There is small (1-33%) pink granulation within the wound bed. There is a small (1-33%) amount of necrotic tissue within the wound bed including Adherent Slough. 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 There was a Excisional Skin/Subcutaneous Tissue Debridement  with a total area of 5 sq cm performed by Tommie Sams., PA-C. With the following instrument(s): Curette to remove Viable and Non-Viable tissue/material. Material removed includes Subcutaneous Tissue, Slough, and Biofilm after achieving pain control using Lidocaine. No specimens were taken. A time out was conducted at 08:40,  prior to the start of the procedure. A Minimum amount of bleeding was controlled with Pressure. The procedure was tolerated well. Post Debridement Measurements: 2.5cm length x 2cm width x 0.3cm depth; 1.178cm^3 volume. Character of Wound/Ulcer Post Debridement is stable. Post procedure Diagnosis Wound #1: Same as Pre-Procedure Pre-procedure diagnosis of Wound #1 is a Pyoderma located on the Left,Lateral Lower Leg. A skin graft procedure using a bioengineered skin substitute/cellular or tissue based product was performed by Tommie Sams., PA-C with the following instrument(s): Blade, Forceps, and Scissors. Apligraf was applied and secured with Steri-Strips. 5 sq cm of product was utilized and 39 sq cm was wasted due to wound size. Post Application, mepitel was applied. A Time Out was conducted at 08:40, prior to the start of the procedure. The procedure was tolerated well. Post procedure Diagnosis Wound #1: Same as Pre-Procedure . BURAK, ZERBE ([id number]) Plan Follow-up Appointments: Return Appointment in 1 week. Nurse Visit as needed - twice a week Bathing/ Shower/ Hygiene: Clean wound with Normal Saline or wound cleanser. May shower with wound dressing protected with water repellent cover or cast protector. No tub bath. Cellular or Tissue Based Products: Wound #1 Left,Lateral Lower Leg: Cellular or Tissue Based Product Type: - Apligraf #5 applied Cellular or Tissue Based Product applied to wound bed; including contact layer, fixation with steri-strips, dry gauze and cover dressing. (DO NOT REMOVE). Edema Control - Lymphedema / Segmental Compressive Device / Other: Optional: One layer of unna paste to top of compression wrap (to act as an anchor). - He needs this to hold it up Elevate, Exercise Daily and Avoid Standing for Long Periods of Time. Elevate legs to the level of the heart and pump ankles as often as possible Elevate leg(s) parallel to the floor when sitting. WOUND #1: -  Lower Leg Wound Laterality: Left, Lateral Cleanser: Wound Cleanser 3 x Per Week/30 Days Discharge Instructions: Wash your hands with soap and water. Remove old dressing, discard into plastic bag and place into trash. Cleanse the wound with Wound Cleanser prior to applying a clean dressing using gauze sponges, not tissues or cotton balls. Do not scrub or use excessive force. Pat dry using gauze sponges, not tissue or cotton balls. Peri-Wound Care: Desitin Maximum Strength Ointment 4 (oz) 3 x Per Week/30 Days Discharge Instructions: Apply around wound edges to avoid maceration Peri-Wound Care: Triamcinolone Acetonide Cream, 0.1%, 15 (g) tube 3 x Per Week/30 Days Discharge Instructions: Apply to leg Topical: Clobetasol Propionate ointment 0.05%, 60 (g) tube 3 x Per Week/30 Days Discharge Instructions: Pt to bring to appt- applied to wound bed Secondary Dressing: (NON-BORDER) Zetuvit Plus Silicone NON-BORDER 5x5 (in/in) 3 x Per Week/30 Days Compression Wrap: Medichoice 4 layer Compression System, 35-40 mmHG 3 x Per Week/30 Days Discharge Instructions: Apply multi-layer wrap as directed. 1. I am going to recommend that we go ahead and continue with the wound care measures as before and the patient is in agreement with plan. This includes the use of the Apligraf which I do feel like is doing a great job for him. Where he can continue with such with the treatment since he is doing so well. 2. With regard to the clobetasol was still  using this topically overall the wound is making excellent progress this is helping keep the inflammation down which definitely is doing a great job compared to previous. We will see patient back for reevaluation in 1 week here in the clinic. If anything worsens or changes patient will contact our office for additional recommendations.This will be a nurse visit I will see him in 2 weeks for provider visit and if we can gain approval we will plan to go ahead and reapplies  the Apligraf at that time for application #6. Electronic Signature(s) Signed: 11/23/2021 9:04:06 AM By: Worthy Keeler PA-C Entered By: Worthy Keeler on 11/23/2021 09:04:06 BROLY, HATFIELD ([id number]) -------------------------------------------------------------------------------- SuperBill Details Patient Name: [name] Date of Service: 11/23/2021 Medical Record Number: [id number] Patient Account Number: [id number] Date of Birth/Sex: Sep 12, 1980 (40 y.o. M) Treating RN: [name] Primary Care Provider: [name] Other Clinician: Referring Provider: [name] Treating Provider/Extender: [name] Cliche in Treatment: 259 Diagnosis Coding ICD-10 Codes Code Description I87.2 Venous insufficiency (chronic) (peripheral) L97.222 Non-pressure chronic ulcer of left calf with fat layer exposed E11.622 Type 2 diabetes mellitus with other skin ulcer L88 Pyoderma gangrenosum F17.208 Nicotine dependence, unspecified, with other nicotine-induced disorders Facility Procedures CPT4 Code: [id number] Description: [phone] (Facility Use Only) Apligraf 44 SQ CM Modifier: Quantity: [place] Code: [id number] Description: 66063 - SKIN SUB GRAFT TRNK/ARM/LEG Modifier: Quantity: 1 CPT4 Code: Description: ICD-10 Diagnosis Description L97.222 Non-pressure chronic ulcer of left calf with fat layer exposed Modifier: Quantity: Physician Procedures CPT4 Code: [id number] Description: 32355 - WC PHYS SKIN SUB GRAFT TRNK/ARM/LEG Modifier: Quantity: 1 CPT4 Code: Description: ICD-10 Diagnosis Description L97.222 Non-pressure chronic ulcer of left calf with fat layer exposed Modifier: Quantity: Electronic Signature(s) Signed: 11/23/2021 9:04:21 AM By: Worthy Keeler PA-C Entered By: Worthy Keeler on 11/23/2021 09:04:21

## 2021-11-24 ENCOUNTER — Other Ambulatory Visit: Payer: Self-pay | Admitting: Primary Care

## 2021-11-24 DIAGNOSIS — E1165 Type 2 diabetes mellitus with hyperglycemia: Secondary | ICD-10-CM

## 2021-11-24 MED ORDER — TRULICITY 3 MG/0.5ML ~~LOC~~ SOAJ: 3.0000 mg | SUBCUTANEOUS | 1 refills | Status: DC

## 2021-11-24 MED ORDER — LANTUS SOLOSTAR 100 UNIT/ML ~~LOC~~ SOPN
30.0000 [IU] | PEN_INJECTOR | Freq: Every day | SUBCUTANEOUS | 1 refills | Status: DC
Start: 2021-11-24 — End: 2022-04-28

## 2021-11-26 DIAGNOSIS — I872 Venous insufficiency (chronic) (peripheral): Secondary | ICD-10-CM | POA: Diagnosis not present

## 2021-11-26 NOTE — Progress Notes (Signed)
[name], SITES ([id number]) Visit Report for 11/26/2021 Physician Orders Details Patient Name: [name], [name] Date of Service: 11/26/2021 8:00 AM Medical Record Number: [id number] Patient Account Number: [id number] Date of Birth/Sex: 06/08/1980 (41 y.o. M) Treating RN: Levora Dredge Primary Care Provider: [name] Other Clinician: Referring Provider: [name] Treating Provider/Extender: [name] Cliche in Treatment: [phone] Verbal / Phone Orders: No Diagnosis Coding Follow-up Appointments o Return Appointment in 1 week. o Nurse Visit as needed - twice a week Bathing/ Shower/ Hygiene o Clean wound with Normal Saline or wound cleanser. o May shower with wound dressing protected with water repellent cover or cast protector. o No tub bath. Cellular or Tissue Based Products Wound #1 Left,Lateral Lower Leg o Cellular or Tissue Based Product Type: - Apligraf #5 applied o Cellular or Tissue Based Product applied to wound bed; including contact layer, fixation with steri-strips, dry gauze and cover dressing. (DO NOT REMOVE). Edema Control - Lymphedema / Segmental Compressive Device / Other o Optional: One layer of unna paste to top of compression wrap (to act as an anchor). - He needs this to hold it up o Elevate, Exercise Daily and Avoid Standing for Long Periods of Time. o Elevate legs to the level of the heart and pump ankles as often as possible o Elevate leg(s) parallel to the floor when sitting. Wound Treatment Wound #1 - Lower Leg Wound Laterality: Left, Lateral Cleanser: Wound Cleanser 3 x Per Week/30 Days Discharge Instructions: Wash your hands with soap and water. Remove old dressing, discard into plastic bag and place into trash. Cleanse the wound with Wound Cleanser prior to applying a clean dressing using gauze sponges, not tissues or cotton balls. Do not scrub or use excessive force. Pat dry using gauze sponges, not tissue or cotton  balls. Peri-Wound Care: Desitin Maximum Strength Ointment 4 (oz) 3 x Per Week/30 Days Discharge Instructions: Apply around wound edges to avoid maceration Peri-Wound Care: Triamcinolone Acetonide Cream, 0.1%, 15 (g) tube 3 x Per Week/30 Days Discharge Instructions: Apply to leg Topical: Clobetasol Propionate ointment 0.05%, 60 (g) tube 3 x Per Week/30 Days Discharge Instructions: Pt to bring to appt- applied to wound bed Secondary Dressing: (NON-BORDER) Zetuvit Plus Silicone NON-BORDER 5x5 (in/in) 3 x Per Week/30 Days Compression Wrap: Medichoice 4 layer Compression System, 35-40 mmHG 3 x Per Week/30 Days Discharge Instructions: Apply multi-layer wrap as directed. Electronic Signature(s) Signed: 11/26/2021 4:25:46 PM By: Levora Dredge Signed: 11/26/2021 4:34:19 PM By: Worthy Keeler PA-C Entered By: Levora Dredge on 11/26/2021 11:29:41 MALEKAI, MARKWOOD ([id number]) -------------------------------------------------------------------------------- SuperBill Details Patient Name: [name] Date of Service: 11/26/2021 Medical Record Number: [id number] Patient Account Number: [id number] Date of Birth/Sex: [birth year] (41 y.o. M) Treating RN: Levora Dredge Primary Care Provider: [name] Other Clinician: Referring Provider: [name] Treating Provider/Extender: [name] Cliche in Treatment: 259 Diagnosis Coding ICD-10 Codes Code Description I87.2 Venous insufficiency (chronic) (peripheral) L97.222 Non-pressure chronic ulcer of left calf with fat layer exposed E11.622 Type 2 diabetes mellitus with other skin ulcer L88 Pyoderma gangrenosum F17.208 Nicotine dependence, unspecified, with other nicotine-induced disorders Facility Procedures CPT4 Code: [id number] Description: (Facility Use Only) [place] Modifier: Quantity: 1 Electronic Signature(s) Signed: 11/26/2021 11:30:24 AM By: Levora Dredge Signed: 11/26/2021 4:34:19 PM By: Worthy Keeler PA-C Entered By: Levora Dredge on 11/26/2021 11:30:24

## 2021-11-26 NOTE — Progress Notes (Signed)
LYCAN, DAVEE ([id number]) Visit Report for 11/26/2021 Arrival Information Details Patient Name: [name], [name] Date of Service: 11/26/2021 8:00 AM Medical Record Number: [id number] Patient Account Number: [id number] Date of Birth/Sex: [birth year] (41 y.o. M) Treating RN: Levora Dredge Primary Care Kayonna Lawniczak: [name] Other Clinician: Referring Fraya Ueda: [name] Treating Irelynd Zumstein/Extender: [name] Cliche in Treatment: 50 Visit Information History Since Last Visit Added or deleted any medications: No Patient Arrived: Ambulatory Any new allergies or adverse reactions: No Arrival Time: 08:03 Had a fall or experienced change in No Accompanied By: self activities of daily living that may affect Transfer Assistance: None risk of falls: Patient Identification Verified: Yes Hospitalized since last visit: No Secondary Verification Process Completed: Yes Has Dressing in Place as Prescribed: Yes Patient Requires Transmission-Based No Has Compression in Place as Prescribed: Yes Precautions: Pain Present Now: No Patient Has Alerts: Yes Patient Alerts: Patient has reaction to silver dressings. Electronic Signature(s) Signed: 11/26/2021 4:25:46 PM By: Levora Dredge Entered By: Levora Dredge on 11/26/2021 08:05:00 [name] ([id number]) -------------------------------------------------------------------------------- Clinic Level of Care Assessment Details Patient Name: [name], [name] Date of Service: 11/26/2021 8:00 AM Medical Record Number: [id number] Patient Account Number: [id number] Date of Birth/Sex: 06-Sep-1980 (40 y.o. M) Treating RN: Levora Dredge Primary Care Jacobie Stamey: [name] Other Clinician: Referring Talon Regala: [name] Treating Vernel Langenderfer/Extender: [name] Cliche in Treatment: [facility] Level of Care Assessment Items TOOL 1 Quantity Score []  - Use when EandM and Procedure is performed on INITIAL visit 0 ASSESSMENTS - Nursing  Assessment / Reassessment []  - General Physical Exam (combine w/ comprehensive assessment (listed just below) when performed on new 0 pt. evals) []  - 0 Comprehensive Assessment (HX, ROS, Risk Assessments, Wounds Hx, etc.) ASSESSMENTS - Wound and Skin Assessment / Reassessment []  - Dermatologic / Skin Assessment (not related to wound area) 0 ASSESSMENTS - Ostomy and/or Continence Assessment and Care []  - Incontinence Assessment and Management 0 []  - 0 Ostomy Care Assessment and Management (repouching, etc.) PROCESS - Coordination of Care []  - Simple Patient / Family Education for ongoing care 0 []  - 0 Complex (extensive) Patient / Family Education for ongoing care []  - 0 Staff obtains Programmer, systems, Records, Test Results / Process Orders []  - 0 Staff telephones HHA, Nursing Homes / Clarify orders / etc []  - 0 Routine Transfer to another Facility (non-emergent condition) []  - 0 Routine Hospital Admission (non-emergent condition) []  - 0 New Admissions / Biomedical engineer / Ordering NPWT, Apligraf, etc. []  - 0 Emergency Hospital Admission (emergent condition) PROCESS - Special Needs []  - Pediatric / Minor Patient Management 0 []  - 0 Isolation Patient Management []  - 0 Hearing / Language / Visual special needs []  - 0 Assessment of Community assistance (transportation, D/C planning, etc.) []  - 0 Additional assistance / Altered mentation []  - 0 Support Surface(s) Assessment (bed, cushion, seat, etc.) INTERVENTIONS - Miscellaneous []  - External ear exam 0 []  - 0 Patient Transfer (multiple staff / Civil Service fast streamer / Similar devices) []  - 0 Simple Staple / Suture removal (25 or less) []  - 0 Complex Staple / Suture removal (26 or more) []  - 0 Hypo/Hyperglycemic Management (do not check if billed separately) []  - 0 Ankle / Brachial Index (ABI) - do not check if billed separately Has the patient been seen at the hospital within the last three years: Yes Total Score: 0 Level Of  Care: ____ [name] ([id number]) Electronic Signature(s) Signed: 11/26/2021 4:25:46 PM By: Levora Dredge Entered By: Levora Dredge on 11/26/2021 11:30:13 [name], [name] ([id number]) --------------------------------------------------------------------------------  Compression Therapy Details Patient Name: [name], [name] Date of Service: 11/26/2021 8:00 AM Medical Record Number: [id number] Patient Account Number: [id number] Date of Birth/Sex: December 28, 1980 (40 y.o. M) Treating RN: Levora Dredge Primary Care Jasai Sorg: [name] Other Clinician: Referring Connell Bognar: [name] Treating Ramces Shomaker/Extender: [name] Cliche in Treatment: 259 Compression Therapy Performed for Wound Assessment: Wound #1 Left,Lateral Lower Leg Performed By: Clinician Levora Dredge, RN Compression Type: Four Layer Electronic Signature(s) Signed: 11/26/2021 4:25:46 PM By: Levora Dredge Entered By: Levora Dredge on 11/26/2021 [place], [place] ([id number]) -------------------------------------------------------------------------------- Encounter Discharge Information Details Patient Name: [name] Date of Service: 11/26/2021 8:00 AM Medical Record Number: [id number] Patient Account Number: [id number] Date of Birth/Sex: November 16, 1980 (40 y.o. M) Treating RN: Levora Dredge Primary Care Mari Battaglia: [name] Other Clinician: Referring Yudith Norlander: [name] Treating Milli Woolridge/Extender: [name] Cliche in Treatment: 259 Encounter Discharge Information Items Discharge Condition: Stable Ambulatory Status: Ambulatory Discharge Destination: Home Transportation: Private Auto Accompanied By: self Schedule Follow-up Appointment: Yes Clinical Summary of Care: Electronic Signature(s) Signed: 11/26/2021 11:30:06 AM By: Levora Dredge Entered By: Levora Dredge on 11/26/2021 11:30:06 [name]  ([id number]) -------------------------------------------------------------------------------- Wound Assessment Details Patient Name: [name] Date of Service: 11/26/2021 8:00 AM Medical Record Number: [id number] Patient Account Number: [id number] Date of Birth/Sex: [birth year] (40 y.o. M) Treating RN: Levora Dredge Primary Care Kiylee Thoreson: [name] Other Clinician: Referring Prudence Heiny: [name] Treating Slayden Mennenga/Extender: [name] Cliche in Treatment: 259 Wound Status Wound Number: 1 Primary Etiology: Pyoderma Wound Location: Left, Lateral Lower Leg Wound Status: Open Wounding Event: Gradually Appeared Comorbid History: Sleep Apnea, Hypertension, Colitis Date Acquired: 11/18/2015 Weeks Of Treatment: 259 Clustered Wound: No Wound Measurements Length: (cm) 2.5 Width: (cm) 2 Depth: (cm) 0.2 Area: (cm) 3.927 Volume: (cm) 0.785 % Reduction in Area: 20% % Reduction in Volume: 80% Epithelialization: None Wound Description Classification: Full Thickness With Exposed Support Structures Exudate Amount: Medium Exudate Type: Serosanguineous Exudate Color: red, brown Slough/Fibrino Yes Wound Bed Granulation Amount: Small (1-33%) Exposed Structure Granulation Quality: Pink Fat Layer (Subcutaneous Tissue) Exposed: Yes Necrotic Amount: Small (1-33%) Necrotic Quality: Adherent Slough Treatment Notes Wound #1 (Lower Leg) Wound Laterality: Left, Lateral Cleanser Wound Cleanser Discharge Instruction: Wash your hands with soap and water. Remove old dressing, discard into plastic bag and place into trash. Cleanse the wound with Wound Cleanser prior to applying a clean dressing using gauze sponges, not tissues or cotton balls. Do not scrub or use excessive force. Pat dry using gauze sponges, not tissue or cotton balls. Peri-Wound Care Desitin Maximum Strength Ointment 4 (oz) Discharge Instruction: Apply around wound edges to avoid maceration Triamcinolone Acetonide  Cream, 0.1%, 15 (g) tube Discharge Instruction: Apply to leg Topical Clobetasol Propionate ointment 0.05%, 60 (g) tube Discharge Instruction: Pt to bring to appt- applied to wound bed Primary Dressing Secondary Dressing (NON-BORDER) Zetuvit Plus Silicone NON-BORDER 5x5 (in/in) Secured With Compression Wrap [name], [name] ([id number]) Medichoice 4 layer Compression System, 35-40 mmHG Discharge Instruction: Apply multi-layer wrap as directed. Compression Stockings Add-Ons Electronic Signature(s) Signed: 11/26/2021 4:25:46 PM By: Levora Dredge Entered By: Levora Dredge on 11/26/2021 08:05:46

## 2021-11-28 DIAGNOSIS — I872 Venous insufficiency (chronic) (peripheral): Secondary | ICD-10-CM | POA: Diagnosis not present

## 2021-11-28 NOTE — Progress Notes (Signed)
RAGHAV, VERRILLI ([id number]) Visit Report for 11/28/2021 Arrival Information Details Patient Name: [name], [name] Date of Service: 11/28/2021 8:00 AM Medical Record Number: [id number] Patient Account Number: [id number] Date of Birth/Sex: 02-09-1981 (41 y.o. M) Treating RN: Levora Dredge Primary Care Dacotah Cabello: [name] Other Clinician: Referring Lailee Hoelzel: [name] Treating Valin Massie/Extender: Yaakov Guthrie in Treatment: 62 Visit Information History Since Last Visit Added or deleted any medications: No Patient Arrived: Ambulatory Any new allergies or adverse reactions: No Arrival Time: 08:01 Had a fall or experienced change in No Accompanied By: self activities of daily living that may affect Transfer Assistance: None risk of falls: Patient Identification Verified: Yes Hospitalized since last visit: No Secondary Verification Process Completed: Yes Has Dressing in Place as Prescribed: Yes Patient Requires Transmission-Based No Has Compression in Place as Prescribed: Yes Precautions: Pain Present Now: No Patient Has Alerts: Yes Patient Alerts: Patient has reaction to silver dressings. Electronic Signature(s) Signed: 11/28/2021 3:48:03 PM By: Levora Dredge Entered By: Levora Dredge on 11/28/2021 08:04:23 [name] ([id number]) -------------------------------------------------------------------------------- Clinic Level of Care Assessment Details Patient Name: [name] Date of Service: 11/28/2021 8:00 AM Medical Record Number: [id number] Patient Account Number: [id number] Date of Birth/Sex: Oct 30, 1980 (41 y.o. M) Treating RN: Levora Dredge Primary Care Myrikal Messmer: [name] Other Clinician: Referring Bexlee Bergdoll: [name] Treating Ngozi Alvidrez/Extender: Yaakov Guthrie in Treatment: 260 Clinic Level of Care Assessment Items TOOL 1 Quantity Score []  - Use when EandM and Procedure is performed on INITIAL visit 0 ASSESSMENTS - Nursing  Assessment / Reassessment []  - General Physical Exam (combine w/ comprehensive assessment (listed just below) when performed on new 0 pt. evals) []  - 0 Comprehensive Assessment (HX, ROS, Risk Assessments, Wounds Hx, etc.) ASSESSMENTS - Wound and Skin Assessment / Reassessment []  - Dermatologic / Skin Assessment (not related to wound area) 0 ASSESSMENTS - Ostomy and/or Continence Assessment and Care []  - Incontinence Assessment and Management 0 []  - 0 Ostomy Care Assessment and Management (repouching, etc.) PROCESS - Coordination of Care []  - Simple Patient / Family Education for ongoing care 0 []  - 0 Complex (extensive) Patient / Family Education for ongoing care []  - 0 Staff obtains Programmer, systems, Records, Test Results / Process Orders []  - 0 Staff telephones HHA, Nursing Homes / Clarify orders / etc []  - 0 Routine Transfer to another Facility (non-emergent condition) []  - 0 Routine Hospital Admission (non-emergent condition) []  - 0 New Admissions / Biomedical engineer / Ordering NPWT, Apligraf, etc. []  - 0 Emergency Hospital Admission (emergent condition) PROCESS - Special Needs []  - Pediatric / Minor Patient Management 0 []  - 0 Isolation Patient Management []  - 0 Hearing / Language / Visual special needs []  - 0 Assessment of Community assistance (transportation, D/C planning, etc.) []  - 0 Additional assistance / Altered mentation []  - 0 Support Surface(s) Assessment (bed, cushion, seat, etc.) INTERVENTIONS - Miscellaneous []  - External ear exam 0 []  - 0 Patient Transfer (multiple staff / Civil Service fast streamer / Similar devices) []  - 0 Simple Staple / Suture removal (25 or less) []  - 0 Complex Staple / Suture removal (26 or more) []  - 0 Hypo/Hyperglycemic Management (do not check if billed separately) []  - 0 Ankle / Brachial Index (ABI) - do not check if billed separately Has the patient been seen at the hospital within the last three years: Yes Total Score: 0 Level Of  Care: ____ [name] ([id number]) Electronic Signature(s) Signed: 11/28/2021 3:48:03 PM By: Levora Dredge Entered By: Levora Dredge on 11/28/2021 08:22:40 [name], [name] ([id number]) --------------------------------------------------------------------------------  Compression Therapy Details Patient Name: [name], [name] Date of Service: 11/28/2021 8:00 AM Medical Record Number: [id number] Patient Account Number: [id number] Date of Birth/Sex: 1980-09-23 (41 y.o. M) Treating RN: Levora Dredge Primary Care Casy Tavano: [name] Other Clinician: Referring Iori Gigante: [name] Treating Elan Brainerd/Extender: Yaakov Guthrie in Treatment: 260 Compression Therapy Performed for Wound Assessment: Wound #1 Left,Lateral Lower Leg Performed By: Clinician Levora Dredge, RN Compression Type: Four Layer Notes pt tolerated without issue Electronic Signature(s) Signed: 11/28/2021 3:48:03 PM By: Levora Dredge Entered By: Levora Dredge on 11/28/2021 08:05:55 [name] ([id number]) -------------------------------------------------------------------------------- Encounter Discharge Information Details Patient Name: [name] Date of Service: 11/28/2021 8:00 AM Medical Record Number: [id number] Patient Account Number: [id number] Date of Birth/Sex: 09-03-1980 (41 y.o. M) Treating RN: Levora Dredge Primary Care Kalub Morillo: [name] Other Clinician: Referring Jaysiah Marchetta: [name] Treating Baily Serpe/Extender: Yaakov Guthrie in Treatment: 260 Encounter Discharge Information Items Discharge Condition: Stable Ambulatory Status: Ambulatory Discharge Destination: Home Transportation: Private Auto Accompanied By: self Schedule Follow-up Appointment: Yes Clinical Summary of Care: Electronic Signature(s) Signed: 11/28/2021 3:48:03 PM By: Levora Dredge Entered By: Levora Dredge on 11/28/2021 08:22:33 [name], [name]  ([id number]) -------------------------------------------------------------------------------- Wound Assessment Details Patient Name: [name] Date of Service: 11/28/2021 8:00 AM Medical Record Number: [id number] Patient Account Number: [id number] Date of Birth/Sex: 12-Aug-1980 (41 y.o. M) Treating RN: Levora Dredge Primary Care Briseyda Fehr: [name] Other Clinician: Referring Burnadette Baskett: [name] Treating Rufina Kimery/Extender: Yaakov Guthrie in Treatment: 260 Wound Status Wound Number: 1 Primary Etiology: Pyoderma Wound Location: Left, Lateral Lower Leg Wound Status: Open Wounding Event: Gradually Appeared Comorbid History: Sleep Apnea, Hypertension, Colitis Date Acquired: 11/18/2015 Weeks Of Treatment: 260 Clustered Wound: No Wound Measurements Length: (cm) 2.5 Width: (cm) 2 Depth: (cm) 0.2 Area: (cm) 3.927 Volume: (cm) 0.785 % Reduction in Area: 20% % Reduction in Volume: 80% Epithelialization: None Wound Description Classification: Full Thickness With Exposed Support Structures Exudate Amount: Medium Exudate Type: Serosanguineous Exudate Color: red, brown Slough/Fibrino Yes Wound Bed Granulation Amount: Small (1-33%) Exposed Structure Granulation Quality: Pink Fat Layer (Subcutaneous Tissue) Exposed: Yes Necrotic Amount: Small (1-33%) Necrotic Quality: Adherent Slough Treatment Notes Wound #1 (Lower Leg) Wound Laterality: Left, Lateral Cleanser Wound Cleanser Discharge Instruction: Wash your hands with soap and water. Remove old dressing, discard into plastic bag and place into trash. Cleanse the wound with Wound Cleanser prior to applying a clean dressing using gauze sponges, not tissues or cotton balls. Do not scrub or use excessive force. Pat dry using gauze sponges, not tissue or cotton balls. Peri-Wound Care Desitin Maximum Strength Ointment 4 (oz) Discharge Instruction: Apply around wound edges to avoid maceration Triamcinolone Acetonide  Cream, 0.1%, 15 (g) tube Discharge Instruction: Apply to leg Topical Clobetasol Propionate ointment 0.05%, 60 (g) tube Discharge Instruction: Pt to bring to appt- applied to wound bed Primary Dressing Secondary Dressing (NON-BORDER) Zetuvit Plus Silicone NON-BORDER 5x5 (in/in) Secured With Compression Wrap [name], [name] ([id number]) Medichoice 4 layer Compression System, 35-40 mmHG Discharge Instruction: Apply multi-layer wrap as directed. Compression Stockings Add-Ons Electronic Signature(s) Signed: 11/28/2021 3:48:03 PM By: Levora Dredge Entered By: Levora Dredge on 11/28/2021 08:04:54

## 2021-11-28 NOTE — Progress Notes (Signed)
[name], [name] ([id number]) Visit Report for 11/28/2021 Physician Orders Details Patient Name: [name], [name] Date of Service: 11/28/2021 8:00 AM Medical Record Number: [id number] Patient Account Number: [id number] Date of Birth/Sex: Oct 19, 1980 (41 y.o. M) Treating RN: Levora Dredge Primary Care Provider: [name] Other Clinician: Referring Provider: [name] Treating Provider/Extender: Yaakov Guthrie in Treatment: [phone] Verbal / Phone Orders: No Diagnosis Coding Follow-up Appointments o Return Appointment in 1 week. o Nurse Visit as needed - twice a week Bathing/ Shower/ Hygiene o Clean wound with Normal Saline or wound cleanser. o May shower with wound dressing protected with water repellent cover or cast protector. o No tub bath. Cellular or Tissue Based Products Wound #1 Left,Lateral Lower Leg o Cellular or Tissue Based Product Type: - Apligraf #5 applied o Cellular or Tissue Based Product applied to wound bed; including contact layer, fixation with steri-strips, dry gauze and cover dressing. (DO NOT REMOVE). Edema Control - Lymphedema / Segmental Compressive Device / Other o Optional: One layer of unna paste to top of compression wrap (to act as an anchor). - He needs this to hold it up o Elevate, Exercise Daily and Avoid Standing for Long Periods of Time. o Elevate legs to the level of the heart and pump ankles as often as possible o Elevate leg(s) parallel to the floor when sitting. Wound Treatment Wound #1 - Lower Leg Wound Laterality: Left, Lateral Cleanser: Wound Cleanser 3 x Per Week/30 Days Discharge Instructions: Wash your hands with soap and water. Remove old dressing, discard into plastic bag and place into trash. Cleanse the wound with Wound Cleanser prior to applying a clean dressing using gauze sponges, not tissues or cotton balls. Do not scrub or use excessive force. Pat dry using gauze sponges, not tissue or cotton  balls. Peri-Wound Care: Desitin Maximum Strength Ointment 4 (oz) 3 x Per Week/30 Days Discharge Instructions: Apply around wound edges to avoid maceration Peri-Wound Care: Triamcinolone Acetonide Cream, 0.1%, 15 (g) tube 3 x Per Week/30 Days Discharge Instructions: Apply to leg Topical: Clobetasol Propionate ointment 0.05%, 60 (g) tube 3 x Per Week/30 Days Discharge Instructions: Pt to bring to appt- applied to wound bed Secondary Dressing: (NON-BORDER) Zetuvit Plus Silicone NON-BORDER 5x5 (in/in) 3 x Per Week/30 Days Compression Wrap: Medichoice 4 layer Compression System, 35-40 mmHG 3 x Per Week/30 Days Discharge Instructions: Apply multi-layer wrap as directed. Electronic Signature(s) Signed: 11/28/2021 9:13:32 AM By: Kalman Shan DO Signed: 11/28/2021 3:48:03 PM By: Levora Dredge Entered By: Levora Dredge on 11/28/2021 08:22:05 [name], [name] ([id number]) -------------------------------------------------------------------------------- SuperBill Details Patient Name: [name] Date of Service: 11/28/2021 Medical Record Number: [id number] Patient Account Number: [id number] Date of Birth/Sex: 1980-06-11 (41 y.o. 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

## 2021-11-30 DIAGNOSIS — I872 Venous insufficiency (chronic) (peripheral): Secondary | ICD-10-CM | POA: Diagnosis not present

## 2021-11-30 NOTE — Progress Notes (Signed)
AUGUSTINO, SAVASTANO ([id number]) Visit Report for 11/30/2021 Arrival Information Details Patient Name: [name], [name] Date of Service: 11/30/2021 8:15 AM Medical Record Number: [id number] Patient Account Number: [id number] Date of Birth/Sex: 1980-05-27 (41 y.o. M) Treating RN: Levora Dredge Primary Care Gervase Colberg: [name] Other Clinician: Referring Wesleigh Markovic: [name] Treating Deral Schellenberg/Extender: [name] Cliche in Treatment: 75 Visit Information History Since Last Visit Added or deleted any medications: No Patient Arrived: Ambulatory Any new allergies or adverse reactions: No Arrival Time: 08:27 Had a fall or experienced change in No Accompanied By: self activities of daily living that may affect Transfer Assistance: None risk of falls: Patient Identification Verified: Yes Hospitalized since last visit: No Secondary Verification Process Completed: Yes Has Dressing in Place as Prescribed: Yes Patient Requires Transmission-Based No Has Compression in Place as Prescribed: Yes Precautions: Pain Present Now: No Patient Has Alerts: Yes Patient Alerts: Patient has reaction to silver dressings. Electronic Signature(s) Signed: 11/30/2021 3:56:03 PM By: Levora Dredge Entered By: Levora Dredge on 11/30/2021 08:27:27 [name] ([id number]) -------------------------------------------------------------------------------- Clinic Level of Care Assessment Details Patient Name: [name] Date of Service: 11/30/2021 8:15 AM Medical Record Number: [id number] Patient Account Number: [id number] Date of Birth/Sex: 14-Mar-1981 (40 y.o. M) Treating RN: Levora Dredge Primary Care Naseem Adler: [name] Other Clinician: Referring Shamela Haydon: [name] Treating Godwin Tedesco/Extender: [name] Cliche in Treatment: 260 Clinic Level of Care Assessment Items TOOL 1 Quantity Score []  - Use when EandM and Procedure is performed on INITIAL visit 0 ASSESSMENTS - Nursing  Assessment / Reassessment []  - General Physical Exam (combine w/ comprehensive assessment (listed just below) when performed on new 0 pt. evals) []  - 0 Comprehensive Assessment (HX, ROS, Risk Assessments, Wounds Hx, etc.) ASSESSMENTS - Wound and Skin Assessment / Reassessment []  - Dermatologic / Skin Assessment (not related to wound area) 0 ASSESSMENTS - Ostomy and/or Continence Assessment and Care []  - Incontinence Assessment and Management 0 []  - 0 Ostomy Care Assessment and Management (repouching, etc.) PROCESS - Coordination of Care []  - Simple Patient / Family Education for ongoing care 0 []  - 0 Complex (extensive) Patient / Family Education for ongoing care []  - 0 Staff obtains Programmer, systems, Records, Test Results / Process Orders []  - 0 Staff telephones HHA, Nursing Homes / Clarify orders / etc []  - 0 Routine Transfer to another Facility (non-emergent condition) []  - 0 Routine Hospital Admission (non-emergent condition) []  - 0 New Admissions / Biomedical engineer / Ordering NPWT, Apligraf, etc. []  - 0 Emergency Hospital Admission (emergent condition) PROCESS - Special Needs []  - Pediatric / Minor Patient Management 0 []  - 0 Isolation Patient Management []  - 0 Hearing / Language / Visual special needs []  - 0 Assessment of Community assistance (transportation, D/C planning, etc.) []  - 0 Additional assistance / Altered mentation []  - 0 Support Surface(s) Assessment (bed, cushion, seat, etc.) INTERVENTIONS - Miscellaneous []  - External ear exam 0 []  - 0 Patient Transfer (multiple staff / Civil Service fast streamer / Similar devices) []  - 0 Simple Staple / Suture removal (25 or less) []  - 0 Complex Staple / Suture removal (26 or more) []  - 0 Hypo/Hyperglycemic Management (do not check if billed separately) []  - 0 Ankle / Brachial Index (ABI) - do not check if billed separately Has the patient been seen at the hospital within the last three years: Yes Total Score: 0 Level Of  Care: ____ [name] ([id number]) Electronic Signature(s) Signed: 11/30/2021 3:56:03 PM By: Levora Dredge Entered By: Levora Dredge on 11/30/2021 08:29:10 [name], [name] ([id number]) --------------------------------------------------------------------------------  Compression Therapy Details Patient Name: [name], [name] Date of Service: 11/30/2021 8:15 AM Medical Record Number: [id number] Patient Account Number: [id number] Date of Birth/Sex: November 20, 1980 (41 y.o. M) Treating RN: Levora Dredge Primary Care Irmalee Riemenschneider: [name] Other Clinician: Referring Dyamon Sosinski: [name] Treating [name] Ragone/Extender: [name] Cliche in Treatment: 260 Compression Therapy Performed for Wound Assessment: Wound #1 Left,Lateral Lower Leg Performed By: Clinician Levora Dredge, RN Compression Type: Four Layer Notes pt tolerated wrap without issue Electronic Signature(s) Signed: 11/30/2021 3:56:03 PM By: Levora Dredge Entered By: Levora Dredge on 11/30/2021 08:28:15 [name], [name] ([id number]) -------------------------------------------------------------------------------- Encounter Discharge Information Details Patient Name: [name] Date of Service: 11/30/2021 8:15 AM Medical Record Number: [id number] Patient Account Number: [id number] Date of Birth/Sex: 1981-01-31 (40 y.o. M) Treating RN: Levora Dredge Primary Care Ashan Cueva: [name] Other Clinician: Referring Kerrigan Gombos: [name] Treating Kofi Murrell/Extender: [name] Cliche in Treatment: 260 Encounter Discharge Information Items Discharge Condition: Stable Ambulatory Status: Ambulatory Discharge Destination: Home Transportation: Private Auto Accompanied By: self Schedule Follow-up Appointment: Yes Clinical Summary of Care: Electronic Signature(s) Signed: 11/30/2021 3:56:03 PM By: Levora Dredge Entered By: Levora Dredge on 11/30/2021 08:29:03 [name]  ([id number]) -------------------------------------------------------------------------------- Wound Assessment Details Patient Name: [name] Date of Service: 11/30/2021 8:15 AM Medical Record Number: [id number] Patient Account Number: [id number] Date of Birth/Sex: 05-02-1981 (40 y.o. M) Treating RN: Levora Dredge Primary Care Madeleine Fenn: [name] Other Clinician: Referring Rubens Cranston: [name] Treating Scott Fix/Extender: [name] Cliche in Treatment: 260 Wound Status Wound Number: 1 Primary Etiology: Pyoderma Wound Location: Left, Lateral Lower Leg Wound Status: Open Wounding Event: Gradually Appeared Comorbid History: Sleep Apnea, Hypertension, Colitis Date Acquired: 11/18/2015 Weeks Of Treatment: 260 Clustered Wound: No Wound Measurements Length: (cm) 2.5 Width: (cm) 2 Depth: (cm) 0.2 Area: (cm) 3.927 Volume: (cm) 0.785 % Reduction in Area: 20% % Reduction in Volume: 80% Epithelialization: None Tunneling: No Undermining: No Wound Description Classification: Full Thickness With Exposed Support Structures Exudate Amount: Medium Exudate Type: Serosanguineous Exudate Color: red, brown Slough/Fibrino Yes Wound Bed Granulation Amount: Small (1-33%) Exposed Structure Granulation Quality: Pink Fat Layer (Subcutaneous Tissue) Exposed: Yes Necrotic Amount: Small (1-33%) Necrotic Quality: Adherent Slough Treatment Notes Wound #1 (Lower Leg) Wound Laterality: Left, Lateral Cleanser Wound Cleanser Discharge Instruction: Wash your hands with soap and water. Remove old dressing, discard into plastic bag and place into trash. Cleanse the wound with Wound Cleanser prior to applying a clean dressing using gauze sponges, not tissues or cotton balls. Do not scrub or use excessive force. Pat dry using gauze sponges, not tissue or cotton balls. Peri-Wound Care Desitin Maximum Strength Ointment 4 (oz) Discharge Instruction: Apply around wound edges to avoid  maceration Triamcinolone Acetonide Cream, 0.1%, 15 (g) tube Discharge Instruction: Apply to leg Topical Clobetasol Propionate ointment 0.05%, 60 (g) tube Discharge Instruction: Pt to bring to appt- applied to wound bed Primary Dressing Secondary Dressing (NON-BORDER) Zetuvit Plus Silicone NON-BORDER 5x5 (in/in) Secured With Compression Wrap [name], [name] ([id number]) Medichoice 4 layer Compression System, 35-40 mmHG Discharge Instruction: Apply multi-layer wrap as directed. Compression Stockings Add-Ons Electronic Signature(s) Signed: 11/30/2021 3:56:03 PM By: Levora Dredge Entered By: Levora Dredge on 11/30/2021 08:27:48

## 2021-11-30 NOTE — Progress Notes (Signed)
CONN, TROMBETTA ([id number]) Visit Report for 11/14/2021 Physician Orders Details Patient Name: [name], [name] Date of Service: 11/14/2021 8:00 AM Medical Record Number: [id number] Patient Account Number: [id number] Date of Birth/Sex: [birth year] (41 y.o. M) Treating RN: Levora Dredge Primary Care Provider: [name] Other Clinician: Referring Provider: [name] Treating Provider/Extender: Yaakov Guthrie in Treatment: [phone] Verbal / Phone Orders: No Diagnosis Coding Follow-up Appointments o Return Appointment in 1 week. o Nurse Visit as needed - twice a week Bathing/ Shower/ Hygiene o Clean wound with Normal Saline or wound cleanser. o May shower with wound dressing protected with water repellent cover or cast protector. o No tub bath. Cellular or Tissue Based Products Wound #1 Left,Lateral Lower Leg o Cellular or Tissue Based Product Type: - Apligraf #3 applied o Cellular or Tissue Based Product applied to wound bed; including contact layer, fixation with steri-strips, dry gauze and cover dressing. (DO NOT REMOVE). Edema Control - Lymphedema / Segmental Compressive Device / Other o Optional: One layer of unna paste to top of compression wrap (to act as an anchor). - He needs this to hold it up o Elevate, Exercise Daily and Avoid Standing for Long Periods of Time. o Elevate legs to the level of the heart and pump ankles as often as possible o Elevate leg(s) parallel to the floor when sitting. Wound Treatment Wound #1 - Lower Leg Wound Laterality: Left, Lateral Cleanser: Wound Cleanser 3 x Per Week/30 Days Discharge Instructions: Wash your hands with soap and water. Remove old dressing, discard into plastic bag and place into trash. Cleanse the wound with Wound Cleanser prior to applying a clean dressing using gauze sponges, not tissues or cotton balls. Do not scrub or use excessive force. Pat dry using gauze sponges, not tissue or cotton  balls. Peri-Wound Care: Desitin Maximum Strength Ointment 4 (oz) 3 x Per Week/30 Days Discharge Instructions: Apply around wound edges to avoid maceration Peri-Wound Care: Triamcinolone Acetonide Cream, 0.1%, 15 (g) tube 3 x Per Week/30 Days Discharge Instructions: Apply to leg Topical: Clobetasol Propionate ointment 0.05%, 60 (g) tube 3 x Per Week/30 Days Discharge Instructions: Pt to bring to appt- applied to wound bed Secondary Dressing: (NON-BORDER) Zetuvit Plus Silicone NON-BORDER 5x5 (in/in) 3 x Per Week/30 Days Compression Wrap: Medichoice 4 layer Compression System, 35-40 mmHG 3 x Per Week/30 Days Discharge Instructions: Apply multi-layer wrap as directed. Electronic Signature(s) Signed: 11/14/2021 4:17:53 PM By: Levora Dredge Signed: 11/30/2021 11:52:51 AM By: Kalman Shan DO Entered By: Levora Dredge on 11/14/2021 16:17:51 [name], [name] ([id number]) -------------------------------------------------------------------------------- SuperBill Details Patient Name: [name] Date of Service: 11/14/2021 Medical Record Number: [id number] Patient Account Number: [id number] Date of Birth/Sex: Sep 30, 1980 (41 y.o. 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

## 2021-11-30 NOTE — Progress Notes (Signed)
QUINTELL, BONNIN ([id number]) Visit Report for 11/30/2021 Physician Orders Details Patient Name: [name], [name] Date of Service: 11/30/2021 8:15 AM Medical Record Number: [id number] Patient Account Number: [id number] Date of Birth/Sex: 11-Nov-1980 (41 y.o. M) Treating RN: Levora Dredge Primary Care Provider: [name] Other Clinician: Referring Provider: [name] Treating Provider/Extender: [name] Cliche in Treatment: [phone] Verbal / Phone Orders: No Diagnosis Coding Follow-up Appointments o Return Appointment in 1 week. o Nurse Visit as needed - twice a week Bathing/ Shower/ Hygiene o Clean wound with Normal Saline or wound cleanser. o May shower with wound dressing protected with water repellent cover or cast protector. o No tub bath. Cellular or Tissue Based Products Wound #1 Left,Lateral Lower Leg o Cellular or Tissue Based Product Type: - Apligraf #5 applied o Cellular or Tissue Based Product applied to wound bed; including contact layer, fixation with steri-strips, dry gauze and cover dressing. (DO NOT REMOVE). Edema Control - Lymphedema / Segmental Compressive Device / Other o Optional: One layer of unna paste to top of compression wrap (to act as an anchor). - He needs this to hold it up o Elevate, Exercise Daily and Avoid Standing for Long Periods of Time. o Elevate legs to the level of the heart and pump ankles as often as possible o Elevate leg(s) parallel to the floor when sitting. Wound Treatment Wound #1 - Lower Leg Wound Laterality: Left, Lateral Cleanser: Wound Cleanser 3 x Per Week/30 Days Discharge Instructions: Wash your hands with soap and water. Remove old dressing, discard into plastic bag and place into trash. Cleanse the wound with Wound Cleanser prior to applying a clean dressing using gauze sponges, not tissues or cotton balls. Do not scrub or use excessive force. Pat dry using gauze sponges, not tissue or cotton  balls. Peri-Wound Care: Desitin Maximum Strength Ointment 4 (oz) 3 x Per Week/30 Days Discharge Instructions: Apply around wound edges to avoid maceration Peri-Wound Care: Triamcinolone Acetonide Cream, 0.1%, 15 (g) tube 3 x Per Week/30 Days Discharge Instructions: Apply to leg Topical: Clobetasol Propionate ointment 0.05%, 60 (g) tube 3 x Per Week/30 Days Discharge Instructions: Pt to bring to appt- applied to wound bed Secondary Dressing: (NON-BORDER) Zetuvit Plus Silicone NON-BORDER 5x5 (in/in) 3 x Per Week/30 Days Compression Wrap: Medichoice 4 layer Compression System, 35-40 mmHG 3 x Per Week/30 Days Discharge Instructions: Apply multi-layer wrap as directed. Electronic Signature(s) Signed: 11/30/2021 3:56:03 PM By: Levora Dredge Signed: 11/30/2021 4:36:28 PM By: Worthy Keeler PA-C Entered By: Levora Dredge on 11/30/2021 08:28:36 CHESTER, SIBERT ([id number]) -------------------------------------------------------------------------------- SuperBill Details Patient Name: [name] Date of Service: 11/30/2021 Medical Record Number: [id number] Patient Account Number: [id number] Date of Birth/Sex: August 08, 1980 (40 y.o. 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

## 2021-12-03 DIAGNOSIS — I872 Venous insufficiency (chronic) (peripheral): Secondary | ICD-10-CM | POA: Diagnosis not present

## 2021-12-03 NOTE — Progress Notes (Signed)
TYLIN, FORCE ([id number]) Visit Report for 12/03/2021 Arrival Information Details Patient Name: [name], [name] Date of Service: 12/03/2021 8:00 AM Medical Record Number: [id number] Patient Account Number: [id number] Date of Birth/Sex: [birth year] (41 y.o. M) Treating RN: Levora Dredge Primary Care Tomma Ehinger: [name] Other Clinician: Referring Quiera Diffee: [name] Treating Adilyn Humes/Extender: [name] Cliche in Treatment: 65 Visit Information History Since Last Visit Added or deleted any medications: No Patient Arrived: Ambulatory Any new allergies or adverse reactions: No Arrival Time: 08:04 Had a fall or experienced change in No Accompanied By: self activities of daily living that may affect Transfer Assistance: None risk of falls: Patient Identification Verified: Yes Hospitalized since last visit: No Secondary Verification Process Completed: Yes Has Dressing in Place as Prescribed: Yes Patient Requires Transmission-Based No Has Compression in Place as Prescribed: Yes Precautions: Pain Present Now: No Patient Has Alerts: Yes Patient Alerts: Patient has reaction to silver dressings. Electronic Signature(s) Signed: 12/03/2021 4:46:59 PM By: Levora Dredge Entered By: Levora Dredge on 12/03/2021 08:04:43 [name] ([id number]) -------------------------------------------------------------------------------- Clinic Level of Care Assessment Details Patient Name: [name], [name] Date of Service: 12/03/2021 8:00 AM Medical Record Number: [id number] Patient Account Number: [id number] Date of Birth/Sex: [birth year] (40 y.o. M) Treating RN: Levora Dredge Primary Care Teana Lindahl: [name] Other Clinician: Referring Catricia Scheerer: [name] Treating Jewelle Whitner/Extender: [name] Cliche in Treatment: 260 Clinic Level of Care Assessment Items TOOL 1 Quantity Score []  - Use when EandM and Procedure is performed on INITIAL visit 0 ASSESSMENTS - Nursing  Assessment / Reassessment []  - General Physical Exam (combine w/ comprehensive assessment (listed just below) when performed on new 0 pt. evals) []  - 0 Comprehensive Assessment (HX, ROS, Risk Assessments, Wounds Hx, etc.) ASSESSMENTS - Wound and Skin Assessment / Reassessment []  - Dermatologic / Skin Assessment (not related to wound area) 0 ASSESSMENTS - Ostomy and/or Continence Assessment and Care []  - Incontinence Assessment and Management 0 []  - 0 Ostomy Care Assessment and Management (repouching, etc.) PROCESS - Coordination of Care []  - Simple Patient / Family Education for ongoing care 0 []  - 0 Complex (extensive) Patient / Family Education for ongoing care []  - 0 Staff obtains Programmer, systems, Records, Test Results / Process Orders []  - 0 Staff telephones HHA, Nursing Homes / Clarify orders / etc []  - 0 Routine Transfer to another Facility (non-emergent condition) []  - 0 Routine Hospital Admission (non-emergent condition) []  - 0 New Admissions / Biomedical engineer / Ordering NPWT, Apligraf, etc. []  - 0 Emergency Hospital Admission (emergent condition) PROCESS - Special Needs []  - Pediatric / Minor Patient Management 0 []  - 0 Isolation Patient Management []  - 0 Hearing / Language / Visual special needs []  - 0 Assessment of Community assistance (transportation, D/C planning, etc.) []  - 0 Additional assistance / Altered mentation []  - 0 Support Surface(s) Assessment (bed, cushion, seat, etc.) INTERVENTIONS - Miscellaneous []  - External ear exam 0 []  - 0 Patient Transfer (multiple staff / Civil Service fast streamer / Similar devices) []  - 0 Simple Staple / Suture removal (25 or less) []  - 0 Complex Staple / Suture removal (26 or more) []  - 0 Hypo/Hyperglycemic Management (do not check if billed separately) []  - 0 Ankle / Brachial Index (ABI) - do not check if billed separately Has the patient been seen at the hospital within the last three years: Yes Total Score: 0 Level Of  Care: ____ [name] ([id number]) Electronic Signature(s) Signed: 12/03/2021 4:46:59 PM By: Levora Dredge Entered By: Levora Dredge on 12/03/2021 08:26:05 [name] ([id number]) --------------------------------------------------------------------------------  Compression Therapy Details Patient Name: [name], [name] Date of Service: 12/03/2021 8:00 AM Medical Record Number: [id number] Patient Account Number: [id number] Date of Birth/Sex: 25-Jun-1980 (40 y.o. M) Treating RN: Levora Dredge Primary Care Brookelin Felber: [name] Other Clinician: Referring Orenthal Debski: [name] Treating Mariadejesus Cade/Extender: [name] Cliche in Treatment: 260 Compression Therapy Performed for Wound Assessment: Wound #1 Left,Lateral Lower Leg Performed By: Clinician Levora Dredge, RN Compression Type: Four Layer Notes pt tolerated wrap without issue Electronic Signature(s) Signed: 12/03/2021 4:46:59 PM By: Levora Dredge Entered By: Levora Dredge on 12/03/2021 08:05:27 [name] ([id number]) -------------------------------------------------------------------------------- Encounter Discharge Information Details Patient Name: [name] Date of Service: 12/03/2021 8:00 AM Medical Record Number: [id number] Patient Account Number: [id number] Date of Birth/Sex: Oct 08, 1980 (40 y.o. M) Treating RN: Levora Dredge Primary Care Dream Nodal: [name] Other Clinician: Referring Jarris Kortz: [name] Treating Link Burgeson/Extender: [name] Cliche in Treatment: 260 Encounter Discharge Information Items Discharge Condition: Stable Ambulatory Status: Ambulatory Discharge Destination: Home Transportation: Private Auto Accompanied By: self Schedule Follow-up Appointment: Yes Clinical Summary of Care: Electronic Signature(s) Signed: 12/03/2021 4:46:59 PM By: Levora Dredge Entered By: Levora Dredge on 12/03/2021 08:26:00 [name]  ([id number]) -------------------------------------------------------------------------------- Wound Assessment Details Patient Name: [name] Date of Service: 12/03/2021 8:00 AM Medical Record Number: [id number] Patient Account Number: [id number] Date of Birth/Sex: Apr 14, 1981 (40 y.o. M) Treating RN: Levora Dredge Primary Care Alichia Alridge: [name] Other Clinician: Referring Trenton Passow: [name] Treating Rody Keadle/Extender: [name] Cliche in Treatment: 260 Wound Status Wound Number: 1 Primary Etiology: Pyoderma Wound Location: Left, Lateral Lower Leg Wound Status: Open Wounding Event: Gradually Appeared Comorbid History: Sleep Apnea, Hypertension, Colitis Date Acquired: 11/18/2015 Weeks Of Treatment: 260 Clustered Wound: No Wound Measurements Length: (cm) 2.5 Width: (cm) 2 Depth: (cm) 0.2 Area: (cm) 3.927 Volume: (cm) 0.785 % Reduction in Area: 20% % Reduction in Volume: 80% Epithelialization: None Wound Description Classification: Full Thickness With Exposed Support Structures Exudate Amount: Medium Exudate Type: Serosanguineous Exudate Color: red, brown Slough/Fibrino Yes Wound Bed Granulation Amount: Small (1-33%) Exposed Structure Granulation Quality: Pink Fat Layer (Subcutaneous Tissue) Exposed: Yes Necrotic Amount: Small (1-33%) Necrotic Quality: Adherent Slough Treatment Notes Wound #1 (Lower Leg) Wound Laterality: Left, Lateral Cleanser Wound Cleanser Discharge Instruction: Wash your hands with soap and water. Remove old dressing, discard into plastic bag and place into trash. Cleanse the wound with Wound Cleanser prior to applying a clean dressing using gauze sponges, not tissues or cotton balls. Do not scrub or use excessive force. Pat dry using gauze sponges, not tissue or cotton balls. Peri-Wound Care Desitin Maximum Strength Ointment 4 (oz) Discharge Instruction: Apply around wound edges to avoid maceration Triamcinolone Acetonide  Cream, 0.1%, 15 (g) tube Discharge Instruction: Apply to leg Topical Clobetasol Propionate ointment 0.05%, 60 (g) tube Discharge Instruction: Pt to bring to appt- applied to wound bed Primary Dressing Secondary Dressing (NON-BORDER) Zetuvit Plus Silicone NON-BORDER 5x5 (in/in) Secured With Compression Wrap Aydin, [name] ([id number]) Medichoice 4 layer Compression System, 35-40 mmHG Discharge Instruction: Apply multi-layer wrap as directed. Compression Stockings Add-Ons Electronic Signature(s) Signed: 12/03/2021 4:46:59 PM By: Levora Dredge Entered By: Levora Dredge on 12/03/2021 08:05:02

## 2021-12-04 NOTE — Progress Notes (Signed)
[name], [name] ([id number]) Visit Report for 12/03/2021 Physician Orders Details Patient Name: [name] Date of Service: 12/03/2021 8:00 AM Medical Record Number: [id number] Patient Account Number: [id number] Date of Birth/Sex: 10/15/1980 (41 y.o. M) Treating RN: Levora Dredge Primary Care Provider: [name] Other Clinician: Referring Provider: [name] Treating Provider/Extender: [name] Cliche in Treatment: [phone] Verbal / Phone Orders: No Diagnosis Coding Follow-up Appointments o Return Appointment in 1 week. o Nurse Visit as needed - twice a week Bathing/ Shower/ Hygiene o Clean wound with Normal Saline or wound cleanser. o May shower with wound dressing protected with water repellent cover or cast protector. o No tub bath. Cellular or Tissue Based Products Wound #1 Left,Lateral Lower Leg o Cellular or Tissue Based Product Type: - Apligraf #5 applied o Cellular or Tissue Based Product applied to wound bed; including contact layer, fixation with steri-strips, dry gauze and cover dressing. (DO NOT REMOVE). Edema Control - Lymphedema / Segmental Compressive Device / Other o Optional: One layer of unna paste to top of compression wrap (to act as an anchor). - He needs this to hold it up o Elevate, Exercise Daily and Avoid Standing for Long Periods of Time. o Elevate legs to the level of the heart and pump ankles as often as possible o Elevate leg(s) parallel to the floor when sitting. Wound Treatment Wound #1 - Lower Leg Wound Laterality: Left, Lateral Cleanser: Wound Cleanser 3 x Per Week/30 Days Discharge Instructions: Wash your hands with soap and water. Remove old dressing, discard into plastic bag and place into trash. Cleanse the wound with Wound Cleanser prior to applying a clean dressing using gauze sponges, not tissues or cotton balls. Do not scrub or use excessive force. Pat dry using gauze sponges, not tissue or cotton  balls. Peri-Wound Care: Desitin Maximum Strength Ointment 4 (oz) 3 x Per Week/30 Days Discharge Instructions: Apply around wound edges to avoid maceration Peri-Wound Care: Triamcinolone Acetonide Cream, 0.1%, 15 (g) tube 3 x Per Week/30 Days Discharge Instructions: Apply to leg Topical: Clobetasol Propionate ointment 0.05%, 60 (g) tube 3 x Per Week/30 Days Discharge Instructions: Pt to bring to appt- applied to wound bed Secondary Dressing: (NON-BORDER) Zetuvit Plus Silicone NON-BORDER 5x5 (in/in) 3 x Per Week/30 Days Compression Wrap: Medichoice 4 layer Compression System, 35-40 mmHG 3 x Per Week/30 Days Discharge Instructions: Apply multi-layer wrap as directed. Electronic Signature(s) Signed: 12/03/2021 4:46:59 PM By: Levora Dredge Signed: 12/04/2021 5:53:34 PM By: Worthy Keeler PA-C Entered By: Levora Dredge on 12/03/2021 08:24:03 [name], [name] ([id number]) -------------------------------------------------------------------------------- SuperBill Details Patient Name: [name] Date of Service: 12/03/2021 Medical Record Number: [id number] Patient Account Number: [id number] Date of Birth/Sex: 12-24-1980 (41 y.o. 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

## 2021-12-05 DIAGNOSIS — I872 Venous insufficiency (chronic) (peripheral): Secondary | ICD-10-CM | POA: Diagnosis not present

## 2021-12-05 NOTE — Unmapped (Signed)
Note-[facility]

## 2021-12-05 NOTE — Progress Notes (Signed)
[name], [name] ([id number]) Visit Report for 12/05/2021 Arrival Information Details Patient Name: [name], [name] Date of Service: 12/05/2021 8:00 AM Medical Record Number: [id number] Patient Account Number: [id number] Date of Birth/Sex: 1980-10-18 (41 y.o. M) Treating RN: Levora Dredge Primary Care [place] Brogden: [name] Other Clinician: Referring Einer Meals: [name] Treating Sitlaly Gudiel/Extender: Yaakov Guthrie in Treatment: 52 Visit Information History Since Last Visit Added or deleted any medications: No Patient Arrived: Ambulatory Any new allergies or adverse reactions: No Arrival Time: 08:26 Had a fall or experienced change in No Accompanied By: self activities of daily living that may affect Transfer Assistance: None risk of falls: Patient Identification Verified: Yes Hospitalized since last visit: No Secondary Verification Process Completed: Yes Has Dressing in Place as Prescribed: Yes Patient Requires Transmission-Based No Has Compression in Place as Prescribed: Yes Precautions: Pain Present Now: No Patient Has Alerts: Yes Patient Alerts: Patient has reaction to silver dressings. Electronic Signature(s) Signed: 12/05/2021 3:19:57 PM By: Levora Dredge Entered By: Levora Dredge on 12/05/2021 08:26:27 [name] ([id number]) -------------------------------------------------------------------------------- Clinic Level of Care Assessment Details Patient Name: [name] Date of Service: 12/05/2021 8:00 AM Medical Record Number: [id number] Patient Account Number: [id number] Date of Birth/Sex: 06/11/1980 (41 y.o. M) Treating RN: Levora Dredge Primary Care Lizandro Spellman: [name] Other Clinician: Referring Khalidah Herbold: [name] Treating Kullen Tomasetti/Extender: Yaakov Guthrie in Treatment: 261 Clinic Level of Care Assessment Items TOOL 1 Quantity Score []  - Use when EandM and Procedure is performed on INITIAL visit 0 ASSESSMENTS - Nursing  Assessment / Reassessment []  - General Physical Exam (combine w/ comprehensive assessment (listed just below) when performed on new 0 pt. evals) []  - 0 Comprehensive Assessment (HX, ROS, Risk Assessments, Wounds Hx, etc.) ASSESSMENTS - Wound and Skin Assessment / Reassessment []  - Dermatologic / Skin Assessment (not related to wound area) 0 ASSESSMENTS - Ostomy and/or Continence Assessment and Care []  - Incontinence Assessment and Management 0 []  - 0 Ostomy Care Assessment and Management (repouching, etc.) PROCESS - Coordination of Care []  - Simple Patient / Family Education for ongoing care 0 []  - 0 Complex (extensive) Patient / Family Education for ongoing care []  - 0 Staff obtains Programmer, systems, Records, Test Results / Process Orders []  - 0 Staff telephones HHA, Nursing Homes / Clarify orders / etc []  - 0 Routine Transfer to another Facility (non-emergent condition) []  - 0 Routine Hospital Admission (non-emergent condition) []  - 0 New Admissions / Biomedical engineer / Ordering NPWT, Apligraf, etc. []  - 0 Emergency Hospital Admission (emergent condition) PROCESS - Special Needs []  - Pediatric / Minor Patient Management 0 []  - 0 Isolation Patient Management []  - 0 Hearing / Language / Visual special needs []  - 0 Assessment of Community assistance (transportation, D/C planning, etc.) []  - 0 Additional assistance / Altered mentation []  - 0 Support Surface(s) Assessment (bed, cushion, seat, etc.) INTERVENTIONS - Miscellaneous []  - External ear exam 0 []  - 0 Patient Transfer (multiple staff / Civil Service fast streamer / Similar devices) []  - 0 Simple Staple / Suture removal (25 or less) []  - 0 Complex Staple / Suture removal (26 or more) []  - 0 Hypo/Hyperglycemic Management (do not check if billed separately) []  - 0 Ankle / Brachial Index (ABI) - do not check if billed separately Has the patient been seen at the hospital within the last three years: Yes Total Score: 0 Level Of  Care: ____ [name] ([id number]) Electronic Signature(s) Signed: 12/05/2021 3:19:57 PM By: Levora Dredge Entered By: Levora Dredge on 12/05/2021 08:28:55 [name] ([id number]) --------------------------------------------------------------------------------  Compression Therapy Details Patient Name: [name], [name] Date of Service: 12/05/2021 8:00 AM Medical Record Number: [id number] Patient Account Number: [id number] Date of Birth/Sex: [birth year] (41 y.o. M) Treating RN: Levora Dredge Primary Care Kairyn Olmeda: [name] Other Clinician: Referring Kyen Taite: [name] Treating Icela Glymph/Extender: Yaakov Guthrie in Treatment: 261 Compression Therapy Performed for Wound Assessment: Wound #1 Left,Lateral Lower Leg Performed By: Clinician Levora Dredge, RN Compression Type: Four Layer Notes pt tolerated wrap without issue Electronic Signature(s) Signed: 12/05/2021 3:19:57 PM By: Levora Dredge Entered By: Levora Dredge on 12/05/2021 08:27:43 [name], [name] ([id number]) -------------------------------------------------------------------------------- Encounter Discharge Information Details Patient Name: [name] Date of Service: 12/05/2021 8:00 AM Medical Record Number: [id number] Patient Account Number: [id number] Date of Birth/Sex: 06/17/1980 (41 y.o. M) Treating RN: Levora Dredge Primary Care Pradeep Beaubrun: [name] Other Clinician: Referring Farah Benish: [name] Treating Alisandra Son/Extender: Yaakov Guthrie in Treatment: 261 Encounter Discharge Information Items Discharge Condition: Stable Ambulatory Status: Ambulatory Discharge Destination: Home Transportation: Private Auto Accompanied By: self Schedule Follow-up Appointment: Yes Clinical Summary of Care: Electronic Signature(s) Signed: 12/05/2021 3:19:57 PM By: Levora Dredge Entered By: Levora Dredge on 12/05/2021 08:28:49 [name]  ([id number]) -------------------------------------------------------------------------------- Wound Assessment Details Patient Name: [name] Date of Service: 12/05/2021 8:00 AM Medical Record Number: [id number] Patient Account Number: [id number] Date of Birth/Sex: 1980/07/02 (41 y.o. M) Treating RN: Levora Dredge Primary Care Erza Mothershead: [name] Other Clinician: Referring Brettany Sydney: [name] Treating Dillyn Menna/Extender: Yaakov Guthrie in Treatment: 261 Wound Status Wound Number: 1 Primary Etiology: Pyoderma Wound Location: Left, Lateral Lower Leg Wound Status: Open Wounding Event: Gradually Appeared Comorbid History: Sleep Apnea, Hypertension, Colitis Date Acquired: 11/18/2015 Weeks Of Treatment: 261 Clustered Wound: No Wound Measurements Length: (cm) 2.5 Width: (cm) 2 Depth: (cm) 0.2 Area: (cm) 3.927 Volume: (cm) 0.785 % Reduction in Area: 20% % Reduction in Volume: 80% Epithelialization: None Wound Description Classification: Full Thickness With Exposed Support Structures Exudate Amount: Medium Exudate Type: Serosanguineous Exudate Color: red, brown Slough/Fibrino Yes Wound Bed Granulation Amount: Small (1-33%) Exposed Structure Granulation Quality: Pink Fat Layer (Subcutaneous Tissue) Exposed: Yes Necrotic Amount: Small (1-33%) Necrotic Quality: Adherent Slough Assessment Notes pt had noted area of irritation just above skin sub, per orders desitin applied to help protect area from rubbing Treatment Notes Wound #1 (Lower Leg) Wound Laterality: Left, Lateral Cleanser Wound Cleanser Discharge Instruction: Wash your hands with soap and water. Remove old dressing, discard into plastic bag and place into trash. Cleanse the wound with Wound Cleanser prior to applying a clean dressing using gauze sponges, not tissues or cotton balls. Do not scrub or use excessive force. Pat dry using gauze sponges, not tissue or cotton balls. Peri-Wound  Care Desitin Maximum Strength Ointment 4 (oz) Discharge Instruction: Apply around wound edges to avoid maceration Triamcinolone Acetonide Cream, 0.1%, 15 (g) tube Discharge Instruction: Apply to leg Topical Clobetasol Propionate ointment 0.05%, 60 (g) tube Discharge Instruction: Pt to bring to appt- applied to wound bed Primary Dressing Secondary Dressing (NON-BORDER) Zetuvit Plus Silicone NON-BORDER 5x5 (in/in) [name], [name] ([id number]) Secured With Compression Wrap Medichoice 4 layer Compression System, 35-40 mmHG Discharge Instruction: Apply multi-layer wrap as directed. Compression Stockings Add-Ons Electronic Signature(s) Signed: 12/05/2021 3:19:57 PM By: Levora Dredge Entered By: Levora Dredge on 12/05/2021 08:27:17

## 2021-12-05 NOTE — Progress Notes (Signed)
[name], [name] ([id number]) Visit Report for 12/05/2021 Physician Orders Details Patient Name: [name], [name] Date of Service: 12/05/2021 8:00 AM Medical Record Number: [id number] Patient Account Number: [id number] Date of Birth/Sex: [birth year] (41 y.o. M) (41 y.o. M) Treating RN: Levora Dredge Primary Care Provider: [name] Other Clinician: Referring Provider: [name] Treating Provider/Extender: Yaakov Guthrie in Treatment: [phone] Verbal / Phone Orders: No Diagnosis Coding Follow-up Appointments o Return Appointment in 1 week. o Nurse Visit as needed - twice a week Bathing/ Shower/ Hygiene o Clean wound with Normal Saline or wound cleanser. o May shower with wound dressing protected with water repellent cover or cast protector. o No tub bath. Cellular or Tissue Based Products Wound #1 Left,Lateral Lower Leg o Cellular or Tissue Based Product Type: - Apligraf #5 applied o Cellular or Tissue Based Product applied to wound bed; including contact layer, fixation with steri-strips, dry gauze and cover dressing. (DO NOT REMOVE). Edema Control - Lymphedema / Segmental Compressive Device / Other o Optional: One layer of unna paste to top of compression wrap (to act as an anchor). - He needs this to hold it up o Elevate, Exercise Daily and Avoid Standing for Long Periods of Time. o Elevate legs to the level of the heart and pump ankles as often as possible o Elevate leg(s) parallel to the floor when sitting. Wound Treatment Wound #1 - Lower Leg Wound Laterality: Left, Lateral Cleanser: Wound Cleanser 3 x Per Week/30 Days Discharge Instructions: Wash your hands with soap and water. Remove old dressing, discard into plastic bag and place into trash. Cleanse the wound with Wound Cleanser prior to applying a clean dressing using gauze sponges, not tissues or cotton balls. Do not scrub or use excessive force. Pat dry using gauze sponges, not tissue or cotton  balls. Peri-Wound Care: Desitin Maximum Strength Ointment 4 (oz) 3 x Per Week/30 Days Discharge Instructions: Apply around wound edges to avoid maceration Peri-Wound Care: Triamcinolone Acetonide Cream, 0.1%, 15 (g) tube 3 x Per Week/30 Days Discharge Instructions: Apply to leg Topical: Clobetasol Propionate ointment 0.05%, 60 (g) tube 3 x Per Week/30 Days Discharge Instructions: Pt to bring to appt- applied to wound bed Secondary Dressing: (NON-BORDER) Zetuvit Plus Silicone NON-BORDER 5x5 (in/in) 3 x Per Week/30 Days Compression Wrap: Medichoice 4 layer Compression System, 35-40 mmHG 3 x Per Week/30 Days Discharge Instructions: Apply multi-layer wrap as directed. Electronic Signature(s) Signed: 12/05/2021 12:03:59 PM By: Kalman Shan DO Signed: 12/05/2021 3:19:57 PM By: Levora Dredge Entered By: Levora Dredge on 12/05/2021 08:28:20 [name], [name] ([id number]) -------------------------------------------------------------------------------- SuperBill Details Patient Name: [name] Date of Service: 12/05/2021 Medical Record Number: [id number] Patient Account Number: [id number] Date of Birth/Sex: 17-Apr-1981 (41 y.o. M) (41 y.o. 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

## 2021-12-07 ENCOUNTER — Ambulatory Visit
Admission: RE | Admit: 2021-12-07 | Discharge: 2021-12-07 | Disposition: A | Discharge status: Home / Self Care | Payer: 59 | Source: Ambulatory Visit | Attending: Primary Care | Admitting: Primary Care

## 2021-12-07 ENCOUNTER — Encounter: Payer: 59 | Admitting: Physician Assistant

## 2021-12-07 DIAGNOSIS — N644 Mastodynia: Secondary | ICD-10-CM | POA: Insufficient documentation

## 2021-12-07 DIAGNOSIS — I872 Venous insufficiency (chronic) (peripheral): Secondary | ICD-10-CM | POA: Diagnosis not present

## 2021-12-07 NOTE — Progress Notes (Signed)
SQUIRE, WITHEY ([id number]) Visit Report for 12/07/2021 Arrival Information Details Patient Name: [name], [name] Date of Service: 12/07/2021 8:15 AM Medical Record Number: [id number] Patient Account Number: [id number] Date of Birth/Sex: Jul 25, 1980 (41 y.o. M) Treating RN: [name] Primary Care Emanie Behan: [name] Other Clinician: Massie Kluver Referring Oliviana Mcgahee: [name] Treating Neiva Maenza/Extender: [name] Cliche in Treatment: 49 Visit Information History Since Last Visit All ordered tests and consults were completed: No Patient Arrived: Ambulatory Added or deleted any medications: No Arrival Time: 08:05 Any new allergies or adverse reactions: No Transfer Assistance: None Had a fall or experienced change in No Patient Requires Transmission-Based No activities of daily living that may affect Precautions: risk of falls: Patient Has Alerts: Yes Hospitalized since last visit: No Patient Alerts: Patient has reaction Pain Present Now: No to silver dressings. Electronic Signature(s) Signed: 12/07/2021 4:34:39 PM By: Massie Kluver Entered By: Massie Kluver on 12/07/2021 08:08:34 [name] ([id number]) -------------------------------------------------------------------------------- Clinic Level of Care Assessment Details Patient Name: [name] Date of Service: 12/07/2021 8:15 AM Medical Record Number: [id number] Patient Account Number: [id number] Date of Birth/Sex: [birth year] (41 y.o. M) Treating RN: [name] Primary Care Alaja Goldinger: [name] Other Clinician: Massie Kluver Referring Dariella Gillihan: [name] Treating Zelda Reames/Extender: [name] Cliche in Treatment: 261 Clinic Level of Care Assessment Items TOOL 1 Quantity Score []  - Use when EandM and Procedure is performed on INITIAL visit 0 ASSESSMENTS - Nursing Assessment / Reassessment []  - General Physical Exam (combine w/ comprehensive assessment (listed just below) when performed on  new 0 pt. evals) []  - 0 Comprehensive Assessment (HX, ROS, Risk Assessments, Wounds Hx, etc.) ASSESSMENTS - Wound and Skin Assessment / Reassessment []  - Dermatologic / Skin Assessment (not related to wound area) 0 ASSESSMENTS - Ostomy and/or Continence Assessment and Care []  - Incontinence Assessment and Management 0 []  - 0 Ostomy Care Assessment and Management (repouching, etc.) PROCESS - Coordination of Care []  - Simple Patient / Family Education for ongoing care 0 []  - 0 Complex (extensive) Patient / Family Education for ongoing care []  - 0 Staff obtains Programmer, systems, Records, Test Results / Process Orders []  - 0 Staff telephones HHA, Nursing Homes / Clarify orders / etc []  - 0 Routine Transfer to another Facility (non-emergent condition) []  - 0 Routine Hospital Admission (non-emergent condition) []  - 0 New Admissions / Biomedical engineer / Ordering NPWT, Apligraf, etc. []  - 0 Emergency Hospital Admission (emergent condition) PROCESS - Special Needs []  - Pediatric / Minor Patient Management 0 []  - 0 Isolation Patient Management []  - 0 Hearing / Language / Visual special needs []  - 0 Assessment of Community assistance (transportation, D/C planning, etc.) []  - 0 Additional assistance / Altered mentation []  - 0 Support Surface(s) Assessment (bed, cushion, seat, etc.) INTERVENTIONS - Miscellaneous []  - External ear exam 0 []  - 0 Patient Transfer (multiple staff / Civil Service fast streamer / Similar devices) []  - 0 Simple Staple / Suture removal (25 or less) []  - 0 Complex Staple / Suture removal (26 or more) []  - 0 Hypo/Hyperglycemic Management (do not check if billed separately) []  - 0 Ankle / Brachial Index (ABI) - do not check if billed separately Has the patient been seen at the hospital within the last three years: Yes Total Score: 0 Level Of Care: ____ [name] ([id number]) Electronic Signature(s) Signed: 12/07/2021 4:34:39 PM By: Massie Kluver Entered By:  Massie Kluver on 12/07/2021 08:44:12 Elias, [name] ([id number]) -------------------------------------------------------------------------------- Compression Therapy Details Patient Name: [name] Date of Service: 12/07/2021 8:15 AM Medical  Record Number: [id number] Patient Account Number: [id number] Date of Birth/Sex: 03-25-1981 (40 y.o. M) Treating RN: [name] Primary Care Laurena Valko: [name] Other Clinician: Massie Kluver Referring Clarabel Marion: [name] Treating Haseeb Fiallos/Extender: [name] Cliche in Treatment: 261 Compression Therapy Performed for Wound Assessment: Wound #1 Left,Lateral Lower Leg Performed By: Clinician [name], RN Compression Type: Four Layer Pre Treatment ABI: 1.2 Post Procedure Diagnosis Same as Pre-procedure Electronic Signature(s) Signed: 12/07/2021 4:34:39 PM By: Massie Kluver Entered By: Massie Kluver on 12/07/2021 08:42:38 [name] ([id number]) -------------------------------------------------------------------------------- Encounter Discharge Information Details Patient Name: [name] Date of Service: 12/07/2021 8:15 AM Medical Record Number: [id number] Patient Account Number: [id number] Date of Birth/Sex: 11-24-1980 (40 y.o. M) Treating RN: [name] Primary Care Ahtziri Jeffries: [name] Other Clinician: Massie Kluver Referring Emelyn Roen: [name] Treating Lux Skilton/Extender: [name] Cliche in Treatment: 261 Encounter Discharge Information Items Discharge Condition: Stable Ambulatory Status: Ambulatory Discharge Destination: Home Transportation: Private Auto Accompanied By: self Schedule Follow-up Appointment: Yes Clinical Summary of Care: Electronic Signature(s) Signed: 12/07/2021 4:34:39 PM By: Massie Kluver Entered By: Massie Kluver on 12/07/2021 08:45:33 Gesner, [name] ([id number]) -------------------------------------------------------------------------------- Lower Extremity Assessment  Details Patient Name: [name] Date of Service: 12/07/2021 8:15 AM Medical Record Number: [id number] Patient Account Number: [id number] Date of Birth/Sex: [birth year] (40 y.o. M) Treating RN: [name] Primary Care Reanne Nellums: [name] Other Clinician: Massie Kluver Referring Annamae Shivley: [name] Treating Patricio Popwell/Extender: [name] Cliche in Treatment: 261 Edema Assessment Assessed: [Left: Yes] [Right: No] Edema: [Left: Ye] [Right: s] Calf Left: Right: Point of Measurement: 35 cm From Medial Instep 45.6 cm Ankle Left: Right: Point of Measurement: 10 cm From Medial Instep 29 cm Vascular Assessment Pulses: Dorsalis Pedis Palpable: [Left:Yes] Electronic Signature(s) Signed: 12/07/2021 12:58:04 PM By: [name], [name], RN, [name], [name], [name] Signed: 12/07/2021 4:34:39 PM By: Massie Kluver Entered By: Massie Kluver on 12/07/2021 08:23:52 Gasper, [name] ([id number]) -------------------------------------------------------------------------------- Multi Wound Chart Details Patient Name: [name] Date of Service: 12/07/2021 8:15 AM Medical Record Number: [id number] Patient Account Number: [id number] Date of Birth/Sex: Aug 31, 1980 (40 y.o. M) Treating RN: [name] Primary Care Briggette Najarian: [name] Other Clinician: Massie Kluver Referring Keshawna Dix: [name] Treating Glendi Mohiuddin/Extender: [name] Cliche in Treatment: 261 Vital Signs Height(in): 71 Pulse(bpm): 71 Weight(lbs): 338 Blood Pressure(mmHg): 140/85 Body Mass Index(BMI): 47.1 Temperature(F): 98.2 Respiratory Rate(breaths/min): 16 Photos: [N/A:N/A] Wound Location: Left, Lateral Lower Leg N/A N/A Wounding Event: Gradually Appeared N/A N/A Primary Etiology: Pyoderma N/A N/A Comorbid History: Sleep Apnea, Hypertension, Colitis N/A N/A Date Acquired: 11/18/2015 N/A N/A Weeks of Treatment: 261 N/A N/A Wound Status: Open N/A N/A Wound Recurrence: No N/A N/A Measurements L x W x D (cm) 2.5x2x0.1  N/A N/A Area (cm) : 3.927 N/A N/A Volume (cm) : 0.393 N/A N/A % Reduction in Area: 20.00% N/A N/A % Reduction in Volume: 90.00% N/A N/A Classification: Full Thickness With Exposed N/A N/A Support Structures Exudate Amount: Medium N/A N/A Exudate Type: Serosanguineous N/A N/A Exudate Color: red, brown N/A N/A Granulation Amount: Small (1-33%) N/A N/A Granulation Quality: Pink N/A N/A Necrotic Amount: Small (1-33%) N/A N/A Exposed Structures: Fat Layer (Subcutaneous Tissue): N/A N/A Yes Epithelialization: None N/A N/A Treatment Notes Electronic Signature(s) Signed: 12/07/2021 4:34:39 PM By: Massie Kluver Entered By: Massie Kluver on 12/07/2021 08:24:21 [name] ([id number]) -------------------------------------------------------------------------------- Multi-Disciplinary Care Plan Details Patient Name: [name] Date of Service: 12/07/2021 8:15 AM Medical Record Number: [id number] Patient Account Number: [id number] Date of Birth/Sex: 03/29/1981 (40 y.o. M) Treating RN: [name] Primary Care Lanaysia Fritchman: [name] Other  Clinician: Massie Kluver Referring Deltha Bernales: [name] Treating Asli Tokarski/Extender: [name] Cliche in Treatment: [place] reviewed with physician Active Inactive Electronic Signature(s) Signed: 12/07/2021 12:58:04 PM By: [name] [name], RN, [name], [name], [name] Signed: 12/07/2021 4:34:39 PM By: Massie Kluver Entered By: Massie Kluver on 12/07/2021 08:23:58 JAIVON, VANBEEK ([id number]) -------------------------------------------------------------------------------- Pain Assessment Details Patient Name: [name] Date of Service: 12/07/2021 8:15 AM Medical Record Number: [id number] Patient Account Number: [id number] Date of Birth/Sex: 1980/08/20 (40 y.o. M) Treating RN: [name] Primary Care Richrd Kuzniar: [name] Other Clinician: Massie Kluver Referring Earlene Bjelland: [name] Treating Tennile Styles/Extender:  [name] Cliche in Treatment: 261 Active Problems Location of Pain Severity and Description of Pain Patient Has Paino No Site Locations Pain Management and Medication Current Pain Management: Electronic Signature(s) Signed: 12/07/2021 12:58:04 PM By: [name], [name], RN, [name], [name], [name] Signed: 12/07/2021 4:34:39 PM By: Massie Kluver Entered By: Massie Kluver on 12/07/2021 08:10:42 [name] ([id number]) -------------------------------------------------------------------------------- Patient/Caregiver Education Details Patient Name: [name] Date of Service: 12/07/2021 8:15 AM Medical Record Number: [id number] Patient Account Number: [id number] Date of Birth/Gender: 1980/07/19 (40 y.o. M) Treating RN: [name] Primary Care Physician: [name] Other Clinician: Massie Kluver Referring Physician: [name] Treating Physician/Extender: [name] Cliche in Treatment: 261 Education Assessment Education Provided To: Patient Education Topics Provided Wound/Skin Impairment: Handouts: Other: continue wound care as directed Methods: Explain/Verbal Responses: State content correctly Electronic Signature(s) Signed: 12/07/2021 4:34:39 PM By: Massie Kluver Entered By: Massie Kluver on 12/07/2021 08:44:52 [place], [name] ([id number]) -------------------------------------------------------------------------------- Wound Assessment Details Patient Name: [name] Date of Service: 12/07/2021 8:15 AM Medical Record Number: [id number] Patient Account Number: [id number] Date of Birth/Sex: [birth year] (40 y.o. M) Treating RN: [name] Primary Care Haziel Molner: [name] Other Clinician: Massie Kluver Referring Zenith Kercheval: [name] Treating Kenzo Ozment/Extender: [name] Cliche in Treatment: 261 Wound Status Wound Number: 1 Primary Etiology: Pyoderma Wound Location: Left, Lateral Lower Leg Wound Status: Open Wounding Event: Gradually Appeared Comorbid  History: Sleep Apnea, Hypertension, Colitis Date Acquired: 11/18/2015 Weeks Of Treatment: 261 Clustered Wound: No Photos Wound Measurements Length: (cm) 2.5 Width: (cm) 2 Depth: (cm) 0.1 Area: (cm) 3.927 Volume: (cm) 0.393 % Reduction in Area: 20% % Reduction in Volume: 90% Epithelialization: None Wound Description Classification: Full Thickness With Exposed Support Structures Exudate Amount: Medium Exudate Type: Serosanguineous Exudate Color: red, brown Slough/Fibrino Yes Wound Bed Granulation Amount: Small (1-33%) Exposed Structure Granulation Quality: Pink Fat Layer (Subcutaneous Tissue) Exposed: Yes Necrotic Amount: Small (1-33%) Necrotic Quality: Adherent Slough Treatment Notes Wound #1 (Lower Leg) Wound Laterality: Left, Lateral Cleanser Wound Cleanser Discharge Instruction: Wash your hands with soap and water. Remove old dressing, discard into plastic bag and place into trash. Cleanse the wound with Wound Cleanser prior to applying a clean dressing using gauze sponges, not tissues or cotton balls. Do not scrub or use excessive force. Pat dry using gauze sponges, not tissue or cotton balls. Peri-Wound Care Desitin Maximum Strength Ointment 4 (oz) [place], [place] ([id number]) Discharge Instruction: Apply around wound edges to avoid maceration Triamcinolone Acetonide Cream, 0.1%, 15 (g) tube Discharge Instruction: Apply to leg Topical Clobetasol Propionate ointment 0.05%, 60 (g) tube Discharge Instruction: Pt to bring to appt- applied to wound bed Primary Dressing Secondary Dressing (NON-BORDER) Zetuvit Plus Silicone NON-BORDER 5x5 (in/in) Secured With Compression Wrap Medichoice 4 layer Compression System, 35-40 mmHG Discharge Instruction: Apply multi-layer wrap as directed. Compression Stockings Add-Ons Electronic Signature(s) Signed: 12/07/2021 12:58:04 PM By: [name], [name], RN, [name], [name], [name] Signed: 12/07/2021 4:34:39 PM  By: Massie Kluver Entered By: Massie Kluver on 12/07/2021 08:22:42 [name] ([id number]) -------------------------------------------------------------------------------- Vitals Details Patient Name: [name] Date of Service: 12/07/2021 8:15 AM Medical Record Number: [id number] Patient Account Number: [id number] Date of Birth/Sex: [birth year] (40 y.o. M) Treating RN: [name] Primary Care Maddi Collar: [name] Other Clinician: Massie Kluver Referring Berwyn Bigley: [name] Treating Shulem Mader/Extender: [name] Cliche in Treatment: 261 Vital Signs Time Taken: 08:08 Temperature (F): 98.2 Height (in): 71 Pulse (bpm): 78 Weight (lbs): 338 Respiratory Rate (breaths/min): 16 Body Mass Index (BMI): 47.1 Blood Pressure (mmHg): 140/85 Reference Range: 80 - 120 mg / dl Electronic Signature(s) Signed: 12/07/2021 4:34:39 PM By: Massie Kluver Entered By: Massie Kluver on 12/07/2021 08:10:38

## 2021-12-07 NOTE — Progress Notes (Addendum)
[name], [name] ([id number]) Visit Report for 12/07/2021 Chief Complaint Document Details Patient Name: [name] Date of Service: 12/07/2021 8:15 AM Medical Record Number: [id number] Patient Account Number: [id number] Date of Birth/Sex: 10-28-1980 (41 y.o. 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 M) Treating RN: [name] Primary Care Provider: [name] Other Clinician: Massie Kluver Referring Provider: [name] Treating Provider/Extender: [name] Cliche in Treatment: 261 History of Present Illness HPI Description: 12/04/16; 41 year old man who comes into the clinic today for review of a wound on the posterior left calf. He tells me that is been there for about a year. He is not a diabetic he does smoke half a pack per day. He was seen in the ER on 11/20/16 felt to have cellulitis around the wound and was given clindamycin. An x-ray did not show osteomyelitis. The patient initially tells me that he has a milk allergy that sets off a pruritic itching rash on his lower legs which she scratches incessantly and he thinks that's what may have set up the wound. He has been using various topical antibiotics and ointments without any effect. He works in a [employer] and is on his feet all day. He  does not have a prior history of wounds however he does have the rash on both lower legs the right arm and the ventral aspect of his left arm. These are excoriations and clearly have had scratching however there are of macular looking areas on both legs including a substantial larger area on the right leg. This does not have an underlying open area. There is no blistering. The patient tells me that 2 years ago in [place] in response to the rash on his legs he saw a dermatologist who told him he had a condition which may be pyoderma gangrenosum although I may be putting words into his mouth. He seemed to recognize this. On further questioning he admits to a 5 year history of quiesced. ulcerative colitis. He is not in any treatment for this. He's had no recent travel 12/11/16; the patient arrives today with his wound and roughly the same condition we've been using silver alginate this is a deep punched out wound with some surrounding erythema but no tenderness. Biopsy I did did not show confirmed pyoderma gangrenosum suggested nonspecific inflammation and vasculitis but does not provide an actual description of what was seen by the pathologist. I'm really not able to understand this We have also received information from the patient's dermatologist in [place] notes from April 2016. This was a doctor Agarwal-antal. 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 He was prescribed topical steroid high potency under occlusion which helped but at this point the patient did not have a deep punched out wound. 12/18/16; the patient's wound is larger in terms of surface area however this surface looks better and there is less depth. The surrounding erythema also is better. The patient states that the wrap we put on came off 2 days ago when he has been  using his compression stockings. He we are in the process of getting a dermatology consult. 12/26/16 on evaluation today patient's left lower extremity wound  shows evidence of infection with surrounding erythema noted. He has been tolerating the dressing changes but states that he has noted more discomfort. There is a larger area of erythema surrounding the wound. No fevers, chills, nausea, or vomiting noted at this time. With that being said the wound still does have slough covering the surface. He is not allergic to any medication that he is aware of at this point. In regard to his right lower extremity he had several regions that are erythematous and pruritic he wonders if there's anything we can do to help that. 01/02/17 I reviewed patient's wound culture which was obtained his visit last week. He was placed on doxycycline at that point. Unfortunately that does not appear to be an antibiotic that would likely help with the situation however the pseudomonas noted on culture is sensitive to Cipro. Also unfortunately patient's wound seems to have a large compared to last week's evaluation. Not severely so but there are definitely increased measurements in general. He is continuing to have discomfort as well he writes this to be a seven out of 10. In fact he would prefer me not to perform any debridement today due to the fact that he is having discomfort and considering he has an active infection on the little reluctant to do so anyway. No fevers, chills, nausea, or vomiting noted at this time. 01/08/17; patient seems dermatology on September 5. I suspect dermatology will want the slides from the biopsy I did sent to their pathologist. I'm not sure if there is a way we can expedite that. In any case the culture I did before I left on vacation 3 weeks ago showed Pseudomonas he was given 10 days of Cipro and per her description of her intake nurses is actually somewhat better this week although the wound is quite a bit bigger than I remember the last time I saw this. He still has 3 more days of Cipro 01/21/17; dermatology appointment tomorrow. He has completed  the ciprofloxacin for Pseudomonas. Surface of the wound looks better however he is had some deterioration in the lesions on his right leg. Meantime the left lateral leg wound we will continue with sample 01/29/17; patient had his dermatology appointment but I can't yet see that note. He is completed his antibiotics. The wound is more superficial but considerably larger in circumferential area than when he came in. This is in his left lateral calf. He also has swollen erythematous areas with superficial wounds on the right leg and small papular areas on both arms. There apparently areas in her his upper thighs and buttocks I did not look at those. Dermatology biopsied the right leg. Hopefully will have their input next week. 02/05/17; patient went back to see his dermatologist who told him that he had a "scratching problem" as well as staph. He is now on a 30 day course of doxycycline and I believe she gave him triamcinolone cream to the right leg areas to help with the itching [not exactly sure but probably triamcinolone]. She apparently looked at the left lateral leg wound although this was not rebiopsied and I think felt to be ultimately part of the same pathogenesis. He is using sample border foam and changing nevus himself. He now has a new open area on the right posterior leg which was his biopsy site I don't have any  of the dermatology notes 02/12/17; we put the patient in compression last week with SANTYL to the wound on the left leg and the biopsy. Edema is much better and the depth of the wound is now at level of skin. Area is still the same oBiopsy site on the right lateral leg we've also been using santyl with a border foam dressing and he is changing this himself. 02/19/17; Using silver alginate started last week to both the substantial left leg wound and the biopsy site on the right wound. He is tolerating compression well. Has a an appointment with his primary M.D. tomorrow wondering about  diuretics although I'm wondering if the edema problem is actually lymphedema 02/26/17; the patient has been to see his primary doctor Dr. Jerrel Ivory at [facility] our primary care. She started him on Lasix 20 mg and this seems to have helped with the edema. However we are not making substantial change with the left lateral calf wound and inflammation. The biopsy site on the right leg also looks stable but not really all that different. 03/12/17; the patient has been to see vein and vascular Dr. Lucky Cowboy. He has had venous reflux studies I have not reviewed these. I did get a call from his dermatology office. They felt that he might have pathergy based on their biopsy on his right leg which led them to look at the slides of COULSON, WEHNER ([id number]) the biopsy I did on the left leg and they wonder whether this represents pyoderma gangrenosum which was the original supposition in a man with ulcerative colitis albeit inactive for many years. They therefore recommended clobetasol and tetracycline i.e. aggressive treatment for possible pyoderma gangrenosum. 03/26/17; apparently the patient just had reflux studies not an appointment with Dr. dew. She arrives in clinic today having applied clobetasol for 2-3 weeks. He notes over the last 2-3 days excessive drainage having to change the dressing 3-4 times a day and also expanding erythema. 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 Lillia Carmel at [facility] dermatology locally who kindly saw him at my request. 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 My concern was the degree of erythema medially extending from his left leg wound which was either  inflammation from pyoderma or cellulitis. I put him on Augmentin however culture of the wound showed Pseudomonas which is quinolone sensitive. I really don't believe he has cellulitis however in view of everything I will continue and give him a course of Cipro. He is also on doxycycline as an immune modulator for the pyoderma. In addition to his original wound on the left lateral leg with surrounding erythema he has a wound on the right posterior calf which was an original biopsy site done by dermatology. This was felt to represent pathergy from pyoderma gangrenosum 04/16/17; pyoderma gangrenosum. Saw Dr. Lillia Carmel yesterday. He has been using topical antibiotics to both wound areas his original wound on the left and the biopsies/pathergy area on the right. There is definitely some improvement in the inflammation around the wound on the right although the patient states he has increasing sensitivity of the wounds. He is on prednisone 60 and doxycycline 1 as prescribed by Dr. Lillia Carmel. He is covering the  topical antibiotic with gauze and putting this in his own compression stocks and changing this daily. He states that Dr. Lottie Rater did a culture of the left leg wound yesterday 05/07/17; pyoderma gangrenosum. The patient saw Dr. Lillia Carmel yesterday and has a follow-up with her in one month. He is still using topical antibiotics to both wounds although he can't recall exactly what type. He is still on prednisone 60 mg. Dr. Lillia Carmel stated that the doxycycline could stop if we were in agreement. 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 The right medial leg was induced by biopsy/pathergy. The area on the right is essentially healed. Still on high-dose prednisone using topical antibiotics to the wound 07/09/17; pyoderma gangrenosum with wounds on the left lateral leg. The right medial leg has  closed and remains closed. He is still on prednisone 60. oHe tells me he missed his last dermatology appointment with Dr. Lillia Carmel but will make another appointment. He reports that her blood sugar at a recent screen in [place] was high 200's. He was 180 today. 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 I think he is going to require still much longer prednisone perhaps another 3 months before attempting to taper. In the meantime his wound is a lot better. Smaller. He is cleaning this off daily and applying topical antibiotics. When he was last in the clinic I thought about changing to [facility] and actually put in a couple of calls to dermatology although probably not during their business hours. 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 He continues on oral prednisone. He has been using triple antibiotic ointment. There is surface debris and we will transition to [facility] and have him return in 2 weeks. He has lost 30 pounds since his last appointment with lifestyle modification. He may benefit from topical steroid cream for treatment this can be considered at a later date. 08/22/17 on evaluation today patient appears to actually be doing rather well in regard to his left lateral lower extremity ulcer. He has actually been managed by Dr. Dellia Nims most recently. Patient is currently on oral steroids at this time. This seems to have been of benefit for him. Nonetheless his last visit was actually with Leah on 08/06/17. Currently he is not utilizing any topical steroid creams although this could be of benefit as well. No fevers, chills, nausea, or vomiting noted at this time. 09/05/17 on evaluation today patient appears to be doing better in regard to his left lateral lower extremity ulcer. He has been tolerating the dressing changes without complication. He is using Santyl  with good effect. Overall I'm very pleased with how things are standing at this point. Patient likewise is happy that this is doing better. 09/19/17 on evaluation today patient actually appears to be doing rather well in regard to his left lateral lower extremity ulcer. Again this is secondary to Pyoderma gangrenosum and he seems to be progressing well with the Santyl which is good news. He's not having any significant pain. 10/03/17 on evaluation today patient appears to be doing excellent in regard to his lower extremity wound on the left secondary to Pyoderma gangrenosum. He has been tolerating the Santyl without complication and in general I feel like he's making good progress. 10/17/17  on evaluation today patient appears to be doing very well in regard to his left lateral lower surety ulcer. He has been tolerating the dressing changes without complication. There does not appear to be any evidence of infection he's alternating the Santyl and the triple antibiotic ointment every other day this seems to be doing well for him. 11/03/17 on evaluation today patient appears to be doing very well in regard to his left lateral lower extremity ulcer. He is been tolerating the dressing changes without complication which is good news. Fortunately there does not appear to be any evidence of infection which is also great news. Overall is doing excellent they are starting to taper down on the prednisone is down to 40 mg at this point it also started topical clobetasol for him. 11/17/17 on evaluation today patient appears to be doing well in regard to his left lateral lower surety ulcer. He's been tolerating the dressing changes without complication. He does note that he is having no pain, no excessive drainage or discharge, and overall he feels like things are going about how he would expect and hope they would. Overall he seems to have no evidence of infection at this time in my opinion which is  good news. 12/04/17-He is seen in follow-up evaluation for right lateral lower extremity ulcer. He has been applying topical steroid cream. Today's measurement show slight increase in size. Over the next 2 weeks we will transition to every other day Santyl and steroid cream. He has been encouraged to monitor for changes and notify clinic with any concerns 12/15/17 on evaluation today patient's left lateral motion the ulcer and fortunately is doing worse again at this point. This just since last week to this week has close to doubled in size according to the patient. I did not seeing last week's I do not have a visual to compare this to in our system was also down so we do not have all the charts and at this point. Nonetheless it does have me somewhat concerned in regard to the fact that again he was worried enough about it he has contact the dermatology that placed them back on the full strength, 50 mg a day of the prednisone that he was taken previous. He continues to alternate using clobetasol along with Santyl at this point. He is obviously somewhat frustrated. 12/22/17 on evaluation today patient appears to be doing a little worse compared to last evaluation. Unfortunately the wound is a little deeper and slightly larger than the last week's evaluation. With that being said he has made some progress in regard to the irritation surrounding at this time unfortunately despite that progress that's been made he still has a significant issue going on here. 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 IZIC, STFORT ([id number]) For that reason I discussed with him today the possibility of perform a wound culture to ensure there's nothing overtly infected. 01/06/18 on evaluation today patient's wound is larger and deeper than previously evaluated. With that being  said it did appear that his wound was infected after my last evaluation with him. Subsequently I did end up prescribing a prescription for Bactrim DS which she has been taking and having no complication with. Fortunately there does not appear to be any evidence of infection at this point in time as far as anything spreading, no want to touch, and  overall I feel like things are showing signs of improvement. 01/13/18 on evaluation today patient appears to be even a little larger and deeper than last time. There still muscle exposed in the base of the wound. Nonetheless he does appear to be less erythematous I do believe inflammation is calming down also believe the infection looks like it's probably resolved at this time based on what I'm seeing. No fevers, chills, nausea, or vomiting noted at this time. 01/30/18 on evaluation today patient actually appears to visually look better for the most part. Unfortunately those visually this looks better he does seem to potentially have what may be an abscess in the muscle that has been noted in the central portion of the wound. This is the first time that I have noted what appears to be fluctuance in the central portion of the muscle. With that being said I'm somewhat more concerned about the fact that this might indicate an abscess formation at this location. I do believe that an ultrasound would be appropriate. This is likely something we need to try to do as soon as possible. He has been switch to mupirocin ointment and he is no longer using the steroid ointment as prescribed by dermatology he sees them again next week he's been decreased from 60 to 40 mg of prednisone. 03/09/18 on evaluation today patient actually appears to be doing a little better compared to last time I saw him. There's not as much erythema surrounding the wound itself. He I did review his most recent infectious disease note which was dated 02/24/18. He saw Dr. Michel Bickers in [place].  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 Patient is now off of antibiotics and they are continually observing this. There seems to be no change in the past two weeks in my pinion based on what the patient says and what I see today compared to what Dr. Megan Salon likely saw two weeks ago. No fevers, chills, nausea, or vomiting noted at this time. 03/23/18 on evaluation today patient's wound actually appears to be showing signs of improvement which is good news. He is currently still on the Dapsone. He is also working on tapering the prednisone to get off of this and Dr. Lottie Rater is working with him in this regard. Nonetheless overall I feel like the wound is doing well it does appear based on the infectious disease note that I reviewed from Dr. 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 I did also review the results of his ultrasound of left lower extremity which revealed there was a dentist tissue in the base of the wound without an abscess noted. 04/06/18 on evaluation today the patient's left lateral lower extremity ulcer actually appears to be doing fairly well which is excellent news. There does not appear to be any evidence of infection at this time which is also great news. Overall he still does have a significantly large ulceration although little by little he seems to be making progress. He is down to 10 mg a day of the prednisone. 04/20/18 on evaluation today patient actually appears to be doing excellent at this time in regard to his left lower extremity ulcer. He's making signs of good progress unfortunately this is taking much longer than we would really like to see  but nonetheless he is making progress. Fortunately there does not appear to be any evidence of infection at this time. No  fevers, chills, nausea, or vomiting noted at this time. The patient has not been using the Santyl due to the cost he hadn't got in this field yet. He's mainly been using the antibiotic ointment topically. Subsequently he also tells me that he really has not been scrubbing in the shower I think this would be helpful again as I told him it doesn't have to be anything too aggressive to even make it believe just enough to keep it free of some of the loose slough and biofilm on the wound surface. 05/11/18 on evaluation today patient's wound appears to be making slow but sure progress in regard to the left lateral lower extremity ulcer. He is been tolerating the dressing changes without complication. Fortunately there does not appear to be any evidence of infection at this time. He is still just using triple antibiotic ointment along with clobetasol occasionally over the area. He never got the Santyl and really does not seem to intend to in my pinion. 06/01/18 on evaluation today patient appears to be doing a little better in regard to his left lateral lower extremity ulcer. He states that overall he does not feel like he is doing as well with the Dapsone as he did with the prednisone. Nonetheless he sees his dermatologist later today and is gonna talk to them about the possibility of going back on the prednisone. Overall again I believe that the wound would be better if you would utilize Santyl but he really does not seem to be interested in going back to the [place] at this point. He has been using triple antibiotic ointment. 06/15/18 on evaluation today patient's wound actually appears to be doing about the same at this point. Fortunately there is no signs of infection at this time. He has made slight improvements although he continues to not really want to clean the wound bed at this point. He states that he just doesn't mess with it he doesn't want to cause any problems with everything else he has going  on. He has been on medication, antibiotics as prescribed by his dermatologist, for a staff infection of his lower extremities which is really drying out now and looking much better he tells me. Fortunately there is no sign of overall infection. 06/29/18 on evaluation today patient appears to be doing well in regard to his left lateral lower surety ulcer all things considering. Fortunately his staff infection seems to be greatly improved compared to previous. He has no signs of infection and this is drying up quite nicely. He is still the doxycycline for this is no longer on cental, Dapsone, or any of the other medications. His dermatologist has recommended possibility of an infusion but right now he does not want to proceed with that. 07/13/18 on evaluation today patient appears to be doing about the same in regard to his left lateral lower surety ulcer. Fortunately there's no signs of infection at this time which is great news. Unfortunately he still builds up a significant amount of Slough/biofilm of the surface of the wound he still is not really cleaning this as he should be appropriately. Again I'm able to easily with saline and gauze remove the majority of this on the surface which if you would do this at home would likely be a dramatic improvement for him as far as getting the area to improve. Nonetheless overall I  still feel like he is making progress is just very slow. I think Santyl will be of benefit for him as well. Still he has not gotten this as of this point. 07/27/18 on evaluation today patient actually appears to be doing little worse in regards of the erythema around the periwound region of the wound he also tells me that he's been having more drainage currently compared to what he was experiencing last time I saw him. He states not quite as bad as what he had because this was infected previously but nonetheless is still appears to be doing poorly. Fortunately there is no evidence  of systemic infection at this point. The patient tells me that he is not going to be able to afford the Santyl. He is still waiting to hear about the infusion therapy with his dermatologist. Apparently she wants an updated colonoscopy first. 08/10/18 on evaluation today patient appears to be doing better in regard to his left lateral lower extremity ulcer. Fortunately he is showing signs of improvement in this regard he's actually been approved for Remicade infusion's as well although this has not been scheduled as of yet. Fortunately there's no signs of active infection at this time in regard to the wound although he is having some issues with infection of the right lower extremity is been seen as dermatologist for this. Fortunately they are definitely still working with him trying to keep things under control. DENNIES, COATE ([id number]) 09/07/18 on evaluation today patient is actually doing rather well in regard to his left lateral lower extremity ulcer. He notes these actually having some hair grow back on his extremity which is something he has not seen in years. He also tells me that the pain is really not giving them any trouble at this time which is also good news overall she is very pleased with the progress he's using a combination of the mupirocin along with the probate is all mixed. 09/21/18 on evaluation today patient actually appears to be doing fairly well all things considered in regard to his looks from the ulcer. He's been tolerating the dressing changes without complication. Fortunately there's no signs of active infection at this time which is good news he is still on all antibiotics or prevention of the staff infection. He has been on prednisone for time although he states it is gonna contact his dermatologist and see if she put them on a short course due to some irritation that he has going on currently. Fortunately there's no evidence of any overall worsening this is going very slow  I think cental would be something that would be helpful for him although he states that $50 for tube is quite expensive. He therefore is not willing to get that at this point. 10/06/18 on evaluation today patient actually appears to be doing decently well in regard to his left lateral leg ulcer. He's been tolerating the dressing changes without complication. Fortunately there's no signs of active infection at this time. Overall I'm actually rather pleased with the progress he's making although it's slow he doesn't show any signs of infection and he does seem to be making some improvement. I do believe that he may need a switch up and dressings to try to help this to heal more appropriately and quickly. 10/19/18 on evaluation today patient actually appears to be doing better in regard to his left lateral lower extremity ulcer. This is shown signs of having much less Slough buildup at this point due to the fact  he has been using the Santyl. Obviously this is very good news. The overall size of the wound is not dramatically smaller but again the appearance is. 11/02/18 on evaluation today patient actually appears to be doing quite well in regard to his lower [place] ulcer. A lot of the skin around the ulcer is actually somewhat irritating at this point this seems to be more due to the dressing causing irritation from the adhesive that anything else. Fortunately there is no signs of active infection at this time. 11/24/18 on evaluation today patient appears to be doing a little worse in regard to his overall appearance of his lower extremity ulcer. There's more erythema and warmth around the wound unfortunately. He is currently on doxycycline which he has been on for some time. With that being said I'm not sure that seems to be helping with what appears to possibly be an acute cellulitis with regard to his left lower extremity ulcer. No fevers, chills, nausea, or vomiting noted at this time. 12/08/18 on  evaluation today patient's wounds actually appears to be doing significantly better compared to his last evaluation. He has been using Santyl along with alternating tripling about appointment as well as the steroid cream seems to be doing quite well and the wound is showing signs of improvement which is excellent news. Fortunately there's no evidence of infection and in fact his culture came back negative with only normal skin flora noted. 12/21/2018 upon evaluation today patient actually appears to be doing excellent with regard to his ulcer. This is actually the best that I have seen it since have been helping to take care of him. It is both smaller as well as less slough noted on the surface of the wound and seems to be showing signs of good improvement with new skin growing from the edges. He has been using just the triamcinolone he does wonder if he can get a refill of that ointment today. 01/04/2019 upon evaluation today patient actually appears to be doing well with regard to his left lateral lower extremity ulcer. With that being said it does not appear to be that he is doing quite as well as last time as far as progression is concerned. There does not appear to be any signs of infection or significant irritation which is good news. With that being said I do believe that he may benefit from switching to a collagen based dressing based on how clean The wound appears. 01/18/2019 on evaluation today patient actually appears to be doing well with regard to his wound on the left lower extremity. He is not made a lot of progress compared to where we were previous but nonetheless does seem to be doing okay at this time which is good news. There is no signs of active infection which is also good news. My only concern currently is I do wish we can get him into utilizing the collagen dressing his insurance would not pay for the supplies that we ordered although it appears that he may be able to order this  through his supply company that he typically utilizes. This is [employer]. Nonetheless he did try to order it during the office visit today and it appears this did go through. We will see if he can get that it is a different brand but nonetheless he has collagen and I do think will be beneficial. 02/01/2019 on evaluation today patient actually appears to be doing a little worse today in regard to the overall size of  his wounds. Fortunately there is no signs of active infection at this time. That is visually. Nonetheless when this is happened before it was due to infection. For that reason were somewhat concerned about that this time as well. 02/08/2019 on evaluation today patient unfortunately appears to be doing slightly worse with regard to his wound upon evaluation today. Is measuring a little deeper and a little larger unfortunately. I am not really sure exactly what is causing this to enlarge he actually did see his dermatologist she is going to see about initiating Humira for him. Subsequently she also did do steroid injections into the wound itself in the periphery. Nonetheless still nonetheless he seems to be getting a little bit larger he is gone back to just using the steroid cream topically which I think is appropriate. I would say hold off on the collagen for the time being is definitely a good thing to do. Based on the culture results which we finally did get the final result back regarding it shows staph as the bacteria noted again that can be a normal skin bacteria based on the fact however he is having increased drainage and worsening of the wound measurement wise I would go ahead and place him on an antibiotic today I do believe for this. 02/15/2019 on evaluation today patient actually appears to be doing somewhat better in regard to his ulcer. There is no signs of worsening at this time I did review his culture results which showed evidence of Staphylococcus aureus but not MRSA. Again  this could just be more related to the normal skin bacteria although he states the drainage has slowed down quite a bit he may have had a mild infection not just colonization. And was much smaller and then since around10/04/2019 on evaluation today patient appears to be doing unfortunately worse as far as the size of the wound. I really feel like that this is steadily getting larger again it had been doing excellent right at the beginning of September we have seen a steady increase in the area of the wound it is almost 2-1/2 times the size it was on September 1. Obviously this is a bad trend this is not wanting to see. For that reason we went back to using just the topical triamcinolone cream which does seem to help with inflammation. I checked him for bacteria by way of culture and nothing showed positive there. I am considering giving him a short course of a tapering steroid Dosepak today to see if that is can be beneficial for him. The patient is in agreement with giving that a try. 03/08/2019 on evaluation today patient appears to be doing very well in comparison to last evaluation with regard to his lower extremity ulcer. This is showing signs of less inflammation and actually measuring slightly smaller compared to last time every other week over the past month and a half he has been measuring larger larger larger. Nonetheless I do believe that the issue has been inflammation the prednisone does seem to [facility] ([id number]) have been beneficial for him which is good news. No fevers, chills, nausea, vomiting, or diarrhea. 03/22/2019 on evaluation today patient appears to be doing about the same with regard to his leg ulcer. He has been tolerating the dressing changes without complication. With that being said the wound seems to be mostly arrested at its current size but really is not making any progress except for when we prescribed the prednisone. He did show some signs of  dropping as far as  the overall size of the wound during that interval week. Nonetheless this is something he is not on long-term at this point and unfortunately I think he is getting need either this or else the Humira which his dermatologist has discussed try to get approval for. With that being said he will be seeing his dermatologist on the 11th of this month that is November. 04/19/2019 on evaluation today patient appears to be doing really about the same the wound is measuring slightly larger compared to last time I saw him. He has not been into the office since November 2 due to the fact that he unfortunately had Covid as that his entire family. He tells me that it was rough but they did pull-through and he seems to be doing much better. Fortunately there is no signs of active infection at this time. No fevers, chills, nausea, vomiting, or diarrhea. 05/10/2019 on evaluation today patient unfortunately appears to be doing significantly worse as compared to last time I saw him. He does tell me that he has had his first dose of Humira and actually is scheduled to get the next one in the upcoming week. With that being said he tells me also that in the past several days he has been having a lot of issues with green drainage she showed me a picture this is more blue-green in color. He is also been having issues with increased sloughy buildup and the wound does appear to be larger today. Obviously this is not the direction that we want everything to take based on the starting of his Humira. Nonetheless I think this is definitely a result of likely infection and to be honest I think this is probably Pseudomonas causing the infection based on what I am seeing. 05/24/2019 on evaluation today patient unfortunately appears to be doing significantly worse compared to his prior evaluation with me 2 weeks ago. I did review his culture results which showed that he does have Staph aureus as well as Pseudomonas noted on the culture.  Nonetheless the Levaquin that I prescribed for him does not appear to have been appropriate and in fact he tells me he is no longer experiencing the green drainage and discharge that he had at the last visit. Fortunately there is no signs of active infection at this time which is good news although the wound has significantly worsened it in fact is much deeper than it was previous. We have been utilizing up to this point triamcinolone ointment as the prescription topical of choice but at this time I really feel like that the wound is getting need to be packed in order to appropriately manage this due to the deeper nature of the wound. Therefore something along the lines of an alginate dressing may be more appropriate. 05/31/2019 upon inspection today patient's wound actually showed signs of doing poorly at this point. Unfortunately he just does not seem to be making any good progress despite what we have tried. He actually did go ahead and pick up the Cipro and start taking that as he was noticing more green drainage he had previously completed the Levaquin that I prescribed for him as well. Nonetheless he missed his appointment for the seventh last week on Wednesday with the wound care center and [facility] where his dermatologist referred him. Obviously I do think a second opinion would be helpful at this point especially in light of the fact that the patient seems to be doing so  poorly despite the fact that we have tried everything that I really know how at this point. The only thing that ever seems to have helped him in the past is when he was on high doses of continual steroids that did seem to make a difference for him. 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 06/07/2019 upon evaluation today patient unfortunately appears to be doing  worse yet again with regard to his wound. In fact I am starting to question whether or not he may have a fluid pocket in the muscle at this point based on the bulging and the soft appearance to the central portion of the muscle area. There is not anything draining from the muscle itself at this time which is good news but nonetheless the wound is expanding. I am not really seeing any results of the Humira as far as overall wound progression based on what I am seeing at this point. The patient has been referred for second opinion with regard to his wound to the [facility] wound care center by his dermatologist which I definitely am not in opposition to. Unfortunately we tried multiple dressings in the past including collagen, alginate, and at one point even Hydrofera Blue. With that being said he is never really used it for any significant amount of time due to the fact that he often complains of pain associated with these dressings and then will go back to either using the Santyl which she has done intermittently or more frequently the triamcinolone. He is also using his own compression stockings. We have wrapped him in the past but again that was something else that he really was not a big fan of. 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 He seemed to do much better when he was on the steroids although honestly I understand that the steroids are not necessarily the best medication to be on long-term obviously 06/14/2019 on evaluation today patient appears to be doing actually a little bit better with regard to the overall appearance with his leg. Unfortunately he does continue to have  issues with what appears to be some fluid underneath the muscle although he did see the wound specialty center at [facility] last week their main goals were to see about infusion therapy in place of the Humira as they feel like that is not quite strong enough. They also recommended that we continue with the treatment otherwise as we are they felt like that was appropriate and they are okay with him continuing to follow-up here with [place] in that regard. With that being said they are also sending him to the vein specialist there to see about vein stripping and if that would be of benefit for him. Subsequently they also did not really address whether or not an ultrasound of the muscle area to see if there is anything that needs to be addressed here would be appropriate or not. For that reason I discussed this with him last  week I think we may proceed down that road at this point. 06/21/2019 upon evaluation today patient's wound actually appears to be doing slightly better compared to previous evaluations. I do believe that he has made a difference with regard to the progression here with the use of oral steroids. Again in the past has been the only thing that is really calm things down. He does tell me that from [facility] is gotten a good news from there that there are no further vein stripping that is necessary at this point. I do not have that available for review today although the patient did relay this to me. He also did obtain and have the ultrasound of the wound completed which I did sign off on today. It does appear that there is no fluid collection under the muscle this is likely then just edematous tissue in general. That is also good news. Overall I still believe the inflammation is the main issue here. He did inquire about the possibility of a wound VAC again with the muscle protruding like it is I am not really sure whether the wound VAC is necessarily ideal or not. That is something we will have to consider  although I do believe he may need compression wrapping to try to help with edema control which could potentially be of benefit. 06/28/2019 on evaluation today patient appears to be doing slightly better measurement wise although this is not terribly smaller he least seems to be trending towards that direction. With that being said he still seems to have purulent drainage noted in the wound bed at this time. He has been on Levaquin followed by Cipro over the past month. Unfortunately he still seems to have some issues with active infection at this time. I did perform a culture last week in order to evaluate and see if indeed there was still anything going on. Subsequently the culture did come back showing Pseudomonas which is consistent with the drainage has been having which is blue-green in color. He also has had an odor that again was somewhat consistent with Pseudomonas as well. Long story short it appears that the culture showed an intermediate finding with regard to how well the Cipro will work for the Pseudomonas infection. Subsequently being that he does not seem to be clearing up and at best what we are doing is just keeping this at [place] I think he may need to see infectious disease to discuss IV antibiotic options. EMMIT, ORILEY ([id number]) 07/05/2019 upon evaluation today patient appears to be doing okay in regard to his leg ulcer. He has been tolerating the dressing changes at this point without complication. Fortunately there is no signs of active infection at this time which is good news. No fevers, chills, nausea, vomiting, or diarrhea. With that being said he does have an appointment with infectious disease tomorrow and his primary care on Wednesday. Again the reason for the infectious disease referral was due to the fact that he did not seem to be fully resolving with the use of oral antibiotics and therefore we were thinking that IV antibiotic therapy may be necessary secondary to the  fact that there was an intermediate finding for how effective the Cipro may be. Nonetheless again he has been having a lot of purulent and even green drainage. Fortunately right now that seems to have calmed down over the past week with the reinitiation of the oral antibiotic. Nonetheless we will see what Dr. Megan Salon has to say. 07/12/2019 upon evaluation today  patient appears to be doing about the same at this point in regard to his left lower extremity ulcer. Fortunately there is no signs of active infection at this time which is good news I do believe the Levaquin has been beneficial I did review Dr. Hale Bogus note and to be honest I agree that the patient's leg does appear to be doing better currently. What we found in the past as he does not seem to really completely resolve he will stop the antibiotic and then subsequently things will revert back to having issues with blue-green drainage, increased pain, and overall worsening in general. Obviously that is the reason I sent him back to infectious disease. 07/19/2019 upon evaluation today patient appears to be doing roughly the same in size there is really no dramatic improvement. He has started back on the Levaquin at this point and though he seems to be doing okay he did still have a lot of blue/green drainage noted on evaluation today unfortunately. I think that this is still indicative more likely of a Pseudomonas infection as previously noted and again he does see Dr. Megan Salon in just a couple of days. I do not know that were really able to effectively clear this with just oral antibiotics alone based on what I am seeing currently. Nonetheless we are still continue to try to manage as best we can with regard to the patient and his wound. I do think the wrap was helpful in decreasing the edema which is excellent news. No fevers, chills, nausea, vomiting, or diarrhea. 07/26/2019 upon evaluation today patient appears to be doing slightly better with  regard to the overall appearance of the muscle there is no dark discoloration centrally. Fortunately there is no signs of active infection at this time. No fevers, chills, nausea, vomiting, or diarrhea. Patient's wound bed currently the patient did have an appointment with Dr. Megan Salon at infectious disease last week. With that being said Dr. Megan Salon the patient states was still somewhat hesitant about put him on any IV antibiotics he wanted [place] to repeat cultures today and then see where things go going forward. He does look like Dr. Megan Salon because of some improvement the patient did have with the Levaquin wanted [place] to see about repeating cultures. If it indeed grows the Pseudomonas again then he recommended a possibility of considering a PICC line placement and IV antibiotic therapy. He plans to see the patient back in 1 to 2 weeks. 08/02/2019 upon evaluation today patient appears to be doing poorly with regard to his left lower extremity. We did get the results of his culture back it shows that he is still showing evidence of Pseudomonas which is consistent with the purulent/blue-green drainage that he has currently. Subsequently the culture also shows that he now is showing resistance to the oral fluoroquinolones which is unfortunate as that was really the only thing to treat the infection prior. I do believe that he is looking like this is going require IV antibiotic therapy to get this under control. Fortunately there is no signs of systemic infection at this time which is good news. The patient does see Dr. Megan Salon tomorrow. 08/09/2019 upon evaluation today patient appears to be doing better with regard to his left lower extremity ulcer in regard to the overall appearance. He is currently on IV antibiotic therapy. As ordered by Dr. Megan Salon. Currently the patient is on ceftazidime which she is going to take for the next 2 weeks and then follow-up for 4 to 5-week  appointment with Dr. Megan Salon.  The patient started this this past Friday symptoms have not for a total of 3 days currently in full. 08/16/2019 upon evaluation today patient's wound actually does show muscle in the base of the wound but in general does appear to be much better as far as the overall evidence of infection is concerned. In fact I feel like this is for the most part cleared up he still on the IV antibiotics he has not completed the full course yet but I think he is doing much better which is excellent news. 08/23/2019 upon evaluation today patient appears to be doing about the same with regard to his wound at this point. He tells me that he still has pain unfortunately. Fortunately there is no evidence of systemic infection at this time which is great news. There is significant muscle protrusion. 09/13/19 upon evaluation today patient appears to be doing about the same in regard to his leg unfortunately. He still has a lot of drainage coming from the ulceration there is still muscle exposed. With that being said the patient's last wound culture still showed an intermediate finding with regard to the Pseudomonas he still having the bluish/green drainage as well. Overall I do not know that the wound has completely cleared of infection at this point. Fortunately there is no signs of active infection systemically at this point which is good news. 09/20/2019 upon evaluation today patient's wound actually appears to be doing about the same based on what I am seeing currently. I do not see any signs of systemic infection he still does have evidence of some local infection and drainage. He did see Dr. Megan Salon last week and Dr. Megan Salon states that he probably does need a different IV antibiotic although he does not want to put him on this until the patient begins the Remicade infusion which is actually scheduled for about 10 days out from today on 13 May. Following that time Dr. Megan Salon is good to see him back and then will  evaluate the feasibility of starting him on the IV antibiotic therapy once again at that point. I do not disagree with this plan I do believe as Dr. Megan Salon stated in his note that I reviewed today that the patient's issue is multifactorial with the pyoderma being 1 aspect of this that were hoping the Remicade will be helpful for her. In the meantime I think the gentamicin is, helping to keep things under decent okay control in regard to the ulcer. 09/27/2019 upon evaluation today patient appears to be doing about the same with regard to his wound still there is a lot of muscle exposure though he does have some hyper granulation tissue noted around the edge and actually some granulation tissue starting to form over the muscle which is actually good news. Fortunately there is no evidence of active infection which is also good news. His pain is less at this point. 5/21; this is a patient I have not seen in a long time. 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 He has a large wound on the left lateral leg with protruding muscle. He comes in the clinic today showing the same area on his left medial ankle. He says there is been a spot there for some time although we have not previously defined this. Today he has a clearly defined area with slight amount of skin breakdown surrounded by raised areas with a purplish hue in color. This is not painful he  says it is irritated. 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 He still has muscle protruding from the lateral portion of his left leg but fortunately the new area that they were concerned about at his last visit does not appear to have opened at this point. He is currently on Remicade infusions and seems to be doing better in my opinion in fact the wound itself seems to be overall much better. The purplish  discoloration that he did have seems to have resolved and I think that is a good sign that hopefully the Remicade is doing its job. He does have some biofilm noted over the surface of the wound. 11/01/2019 on evaluation today patient's wound actually appears to be doing excellent at this time. Fortunately there is no evidence of active infection and overall I feel like he is making great progress. The Remicade seems to be due excellent job in my opinion. CAMDIN, HEGNER ([id number]) 11/08/19 evaluation today vision actually appears to be doing quite well with regard to his weight ulcer. He's been tolerating dressing changes without complication. Fortunately there is no evidence of infection. No fevers, chills, nausea, or vomiting noted at this time. Overall states that is having more itching than pain which is actually a good sign in my opinion. 12/13/2019 upon evaluation today patient appears to be doing well today with regard to his wound. He has been tolerating the dressing changes without complication. Fortunately there is no sign of active infection at this time. No fevers, chills, nausea, vomiting, or diarrhea. Overall I feel like the infusion therapy has been very beneficial for him. 01/06/2020 on evaluation today patient appears to be doing well with regard to his wound. This is measuring smaller and actually looks to be doing better. Fortunately there is no signs of active infection at this point. No fevers, chills, nausea, vomiting, or diarrhea. With that being said he does still have the blue-green drainage but this does not seem to be causing any significant issues currently. He has been using the gentamicin that does seem to be keeping things under decent control at this point. He goes later this morning for his next infusion therapy for the pyoderma which seems to also be very beneficial. 02/07/2020 on evaluation today patient appears to be doing about the same in regard to his wounds  currently. Fortunately there is no signs of active infection systemically he does still have evidence of local infection still using gentamicin. He also is showing some signs of improvement albeit slowly I do feel like we are making some progress here. 02/21/2020 upon evaluation today patient appears to be making some signs of improvement the wound is measuring a little bit smaller which is great news and overall I am very pleased with where he stands currently. He is going to be having infusion therapy treatment on the 15th of this month. Fortunately there is no signs of active infection at this time. 03/13/2020 I do believe patient's wound is actually showing some signs of improvement here which is great news. He has continue with the infusion therapy through rheumatology/dermatology at [facility]. That does seem to be beneficial. I still think he gets as much benefit from this as he did from the prednisone initially but nonetheless obviously this is less harsh on his body that the prednisone as far as they are concerned. 03/31/2020 on evaluation today patient's wound actually showing signs of some pretty  good improvement in regard to the overall appearance of the wound bed. There is still muscle exposed though he does have some epithelial growth around the edges of the wound. Fortunately there is no signs of active infection at this time. No fevers, chills, nausea, vomiting, or diarrhea. 04/24/2020 upon evaluation today patient appears to be doing about the same in regard to his leg ulcer. He has been tolerating the dressing changes without complication. Fortunately there is no signs of active infection at this time. No fevers, chills, nausea, vomiting, or diarrhea. With that being said he still has a lot of irritation from the bandaging around the edges of the wound. We did discuss today the possibility of a referral to plastic surgery. 05/22/2020 on evaluation today patient appears to be doing well with  regard to his wounds all things considered. He has not been able to get the Chantix apparently there is a recall nurse that I was unaware of put out by [employer] involuntarily. Nonetheless for now I am and I have to do some research into what may be the best option for him to help with quitting in regard to smoking and we discussed that today. 06/26/2020 upon evaluation today patient appears to be doing well with regard to his wound from the standpoint of infection I do not see any signs of infection at this point. With that being said unfortunately he is still continuing to have issues with muscle exposure and again he is not having a whole lot of new skin growth unfortunately. There does not appear to be any signs of active infection at this time. No fevers, chills, nausea, vomiting, or diarrhea. 07/10/2020 upon evaluation today patient appears to be doing a little bit more poorly currently compared to where he was previous. I am concerned currently about an active infection that may be getting worse especially in light of the increased size and tenderness of the wound bed. No fevers, chills, nausea, vomiting, or diarrhea. 07/24/2020 upon evaluation today patient appears to be doing poorly in regard to his leg ulcer. He has been tolerating the dressing changes without complication but unfortunately is having a lot of discomfort. 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 Subsequently I think he is going require possibly IV antibiotics to get this under control. I am very concerned about the severity of his infection and the amount of discomfort he is having. 07/31/2020 upon evaluation today patient appears to be doing about the same in regard to his leg wound. He did see Dr. Megan Salon and Dr. Megan Salon is actually going to start him on IV antibiotics. He goes for the PICC line tomorrow. With that being said there do not have that run for  2 weeks and then see how things are doing and depending on how he is progressing they may extend that a little longer. Nonetheless I am glad this is getting ready to be in place and definitely feel it may help the patient. In the meantime is been using mainly triamcinolone to the wound bed has an anti-inflammatory. 08/07/2020 on evaluation today patient appears to be doing well with regard to his wound compared even last week. In the interim he has gotten the PICC line placed and overall this seems to be doing excellent. There does not appear to be any evidence of infection which is great news systemically although locally of course has had the infection this appears to be improving with the use of  the antibiotics. 08/14/2020 upon evaluation today patient's wound actually showing signs of excellent improvement. Overall the irritation has significantly improved the drainage is back down to more of a normal level and his pain is really pretty much nonexistent compared to what it was. Obviously I think that this is significantly improved secondary to the IV antibiotic therapy which has made all the difference in the world. Again he had a resistant form of Pseudomonas for which oral antibiotics just was not cutting it. Nonetheless I do think that still we need to consider the possibility of a surgical closure for this wound is been open so long and to be honest with muscle exposed I think this can be very hard to get this to close outside of this although definitely were still working to try to do what we can in that regard. 08/21/2020 upon evaluation today patient appears to be doing very well with regard to his wounds on the left lateral lower extremity/calf area. Fortunately there does not appear to be signs of active infection which is great news and overall very pleased with where things stand today. He is actually wrapping up his treatment with IV antibiotics tomorrow. After that we will see where  things go from there. 08/28/2020 upon evaluation today patient appears to be doing decently well with regard to his leg ulcer. There does not appear to be any signs of active infection which is great news and overall very pleased with where things stand today. No fevers, chills, nausea, vomiting, or diarrhea. 09/18/2020 upon evaluation today patient appears to be doing well with regard to his infection which I feel like is better. Unfortunately he is not doing as well with regard to the overall size of the wound which is not nearly as good at this point. I feel like that he may be having an issue here with the pyoderma being somewhat out of control. I think that he may benefit from potentially going back and talking to the dermatologist about KLYE, BESECKER ([id number]) what to do from the pyoderma standpoint. I am not certain if the infusions are helping nearly as much is what the prednisone did in the past. 10/02/2020 upon evaluation today patient appears to be doing well with regard to his leg ulcer. He did go to the [facility] nurse. Unfortunately they feel like there is a 10% chance that most that he would be able to heal and that the skin graft would take. Obviously this has led him to not be able to go down that path as far as treatment is concerned. Nonetheless he does seem to be doing a little bit better with the prednisone that I gave him last time. I think that he may need to discuss with dermatology the possibility of long-term prednisone as that seems to be what is most helpful for him to be perfectly honest. I am not sure the Remicade is really doing the job. 10/17/2020 upon evaluation today patient appears to be doing a little better in regard to his wound. In fact the case has been since we did the prednisone on May 2 for him that we have noticed a little bit of improvement each time we have seen a size wise as well as appearance wise as well as pain wise. I think the prednisone has had a  greater effect then the infusion therapy has to be perfectly honest. With that being said the patient also feels significantly better compared to what he was previous. All of this  is good news but nonetheless I am still concerned about the fact that again we are really not set up to long-term manage him as far as prednisone is concerned. Obviously there are things that you need to be watched I completely understand the risk of prednisone usage as well. 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 With all that being said I do believe that we can give him another round of the prednisone which she is requesting today because of the improvement that he seen since we did that first round. 10/30/2020 upon evaluation today patient's wound actually is showing signs of doing quite well. There does not appear to be any evidence of infection which is great news and overall very pleased with where things stand today. No fevers, chills, nausea, vomiting, or diarrhea. He tells me that the prednisone still has seem to have helped he wonders if we can extend that for just a little bit longer. He did not have the appointment with a dermatologist although he did have an infusion appointment last Friday. That was at [facility]. With that being said he tells me he could not do both that as well as the appointment with the physician on the same day therefore that is can have to be rescheduled. I really want to see if there is anything they feel like that could be done differently to try to help this out as I am not really certain that the infusions are helping significantly here. 11/13/2020 upon evaluation today patient unfortunately appears to be doing somewhat poorly in regard to his wound I feel like this is actually worsening from the standpoint of the pyoderma spreading. I still feel like that he may need something different as far as trying to manage this going forward. Again we  did the prednisone unfortunately his blood sugars are not doing so well because of this. Nonetheless I believe that the patient likely needs to try topical steroid. We have done triamcinolone for a while I think going with something stronger such as clobetasol could be beneficial again this is not something I do lightly I discussed this with the patient that again this does not normally put underneath an occlusive dressing. Nonetheless I think a thin film as such could help with some of the stronger anti-inflammatory effects. We discussed this today. He would like to try to give this a trial for the next couple weeks. I definitely think that is something that we can do. Evaluate7/03/2021 and today patient's wound bed actually showed signs of doing really about the same. There was a little expansion of the size of the wound and that leading edge that we done looking out although the clobetasol does seem to have slowed this down a bit in my opinion. There is just 1 small area that still seems to be progressing based on what I see. Nonetheless I am concerned about the fact this does not seem to be improving if anything seems to be doing a little bit worse. I do not know that the infusions are really helping him much as next infusion is August 5 his appointment with dermatology is July 25. Either way I really think that we need to have a conversation potentially about this and I am actually going to see if I can talk with Dr. Lillia Carmel in order to see where things stand as well. 12/11/2020 upon evaluation today patient appears to be doing worse in regard to  his leg ulcer. Unfortunately I just do not think this is making the progress that I would like to see at this point. Honestly he does have an appointment with dermatology and this is in 2 days. I am wondering what they may have to offer to help with this. Right now what I am seeing is that he is continuing to show signs of worsening little by little.  Obviously that is not great at all. Is the exact opposite of what we are looking for. 12/18/2020 upon evaluation today patient appears to be doing a little better in regard to his wound. The dermatologist actually did do some steroid injections into the wound which does seem to have been beneficial in my opinion. That was on the 25th already this looks a little better to me than last time I saw him. With that being said we did do a culture and this did show that he has Staph aureus noted in abundance in the wound. With that being said I do think that getting him on an oral antibiotic would be appropriate as well. Also think we can compression wrap and this will make a difference as well. 12/28/2020 upon evaluation today patient's wound is actually showing signs of doing much better. I do believe the compression wrap is helping he has a lot of drainage but to be honest I think that the compression is helping to some degree in this regard as well as not draining through which is also good news. No fevers, chills, nausea, vomiting, or diarrhea. 01/04/2021 upon evaluation today patient appears to be doing well with regard to his wound. Overall things seem to be doing quite well. He did have a little bit of reaction to the CarboFlex Sorbact he will be using that any longer. With that being said he is controlled as far as the drainage is concerned overall and seems to be doing quite well. I do not see any signs of active infection at this time which is great news. No fevers, chills, nausea, vomiting, or diarrhea. 01/11/2021 upon evaluation today patient appears to be doing well with regard to his wounds. He has been tolerating the dressing changes without complication. Fortunately there does not appear to be any signs of active infection at this time which is great news. Overall I am extremely pleased with where we stand currently. No fevers, chills, nausea, vomiting, or diarrhea. Where using clobetasol in the  wound bed he has a lot of new skin growth which is awesome as well. 01/18/2021 upon evaluation today patient appears to be doing very well in regard to his leg ulcer. He has been tolerating the dressing changes without complication. Fortunately there does not appear to be any signs of active infection which is great news. In general I think that he is making excellent progress 01/25/2021 upon evaluation today patient appears to be doing well with regard to his wound on the leg. I am actually extremely pleased with where things stand today. There does not appear to be any signs of active infection which is great news and overall I think that we are definitely headed in the appropriate direction based on what I am seeing currently. There does not appear to be any signs of active infection also excellent news. 02/06/2021 upon evaluation today patient appears to be doing well with regard to his wound. Overall visually this is showing signs of significant improvement which is great news. I do not see any signs of active infection systemically which  is great even locally I do not think that we are seeing any major complications here. We did do fluorescence imaging with the [employer] today. The patient does have some odor and drainage noted and again this is something that I think would benefit him to probably come more frequently for nurse visits. 02/19/2021 upon evaluation today patient actually appears to be doing quite well in regard to his wound. He has been tolerating the dressing Owusu, Ariyon ([id number]) changes without complication and overall I think that this is making excellent progress. I do not see any evidence of active infection at this point which is great news as well. No fevers, chills, nausea, vomiting, or diarrhea. 10/10; wound is made nice progress healthy granulation with a nice rim of epithelialization which seems to be expanding even from last week he has a deeper area in the  inferior part of the more distal part of the wound with not quite as healthy as surface. This area will need to be followed. Using clobetasol and Hydrofera Blue 03/05/2021 upon evaluation today patient appears to be doing very well in regard to his leg ulcer. He has been tolerating dressing changes without complication. Fortunately there does not appear to be any signs of active infection which is great news and overall I am extremely pleased with where we stand currently. 03/12/2021 upon evaluation today patient appears to be doing well with regard to his wound in fact this is extremely extremely good based on what we are seeing today there does not appear to be any signs of active infection and overall I think that he is doing awesome from the standpoint of healing in general. I am extremely pleased with how things seem to be progressing with regard to this pyoderma. Clobetasol has done wonders for him. I think the compression wrapping has also been of great benefit. 03/19/2021 upon evaluation today patient appears to be doing well with regard to his wound. He is tolerating the dressing changes without complication. In fact I feel like that he is actually making excellent progress at this point based on what I am seeing. No fevers, chills, nausea, vomiting, or diarrhea. 03/26/2021 upon evaluation today patient appears to be doing well with regard to his wound. This again is measuring smaller and looking better. Again the progress is slow but nonetheless continual with what we have been seeing. I do believe that the current plan is doing awesome for him. 04/09/2021 upon evaluation today patient appears to be doing well with regard to his leg ulcer. This is showing signs of excellent improvement the muscle is completely closed over and there does not appear to be any evidence of inflammation at this point his drainage is significantly improved. Overall I think that he would be a good candidate for  looking into a skin substitute at this point as well. We will get a look into some approvals in that regard. Potentially TheraSkin as well as Apligraf could both be considered just depending on insurance coverage. 04/16/2021 upon evaluation today patient appears to be doing well at this point. He has been approved for the Apligraf which we could definitely order although I would like to try to get the TheraSkin approved if at all possible. I did fax notes into them today and I Georgina Peer try to give a call as well. Overall the wound appears to be doing decently well today. 04/20/2021 upon evaluation today patient actually appears to be doing quite well in regard to his wound with  that being said we are trying to see what we can do about speeding up the healing process. For that reason we did discuss the possibility of a skin substitute. We got the Apligraf approved. For that reason we will get a go ahead and see what we can do with the Apligraf at this point. I am still trying to get the TheraSkin approved but I have not heard anything from the insurance company yet I have called and talked to them earlier in the week and to be honest this was about half an hour that I spent on the phone they told me I would hear something within 10-15 business days. 04/27/2021 upon evaluation today patient appears to be doing excellent in regard to his wound. I do believe the Apligraf has been beneficial. With that being said he has had a little bit of increased pain has been a little bit concerned. I do want to go ahead and apply his steroid cream today the clobetasol and then put the Apligraf over I just do not think I want to risk not putting the clobetasol on based on what is been going on here currently. Patient voiced understanding. 05/04/2021 upon evaluation today patient is making excellent improvement. Overall there is definitely decrease in the size of the wound today and I am very pleased in that regard. I do not  see any signs of active infection locally nor systemically at this point. No fevers, chills, nausea, vomiting, or diarrhea. 05/11/2021 upon evaluation today patient appears to be doing well with regard to his wound. He has been tolerating Apligraf's without complication and is making excellent progress this is application #4 today. 12/30; patient here for Apligraf application. Appears to be doing well small wound there has been a lot of healing 05/28/2021 upon evaluation today patient appears to be doing well with regard to his wound. Has been tolerating the dressing changes without complication. Fortunately I do not see any signs of active infection locally nor systemically at this point which is great news. He is done with the Apligraf and the first round and overall this has filled in quite nicely I am not even certain he needs any additional at the moment. I would actually try to go back to clobetasol with [name] which previously was doing well for him. 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 He is using clobetasol and Hydrofera Blue. Surface area of the wound is smaller and the surface looks very healthy. 06/11/2021 upon evaluation today patient actually appears to be showing signs of good improvement which is great news. Fortunately I do not see any signs of active infection at this time which is also excellent news. I do believe that the patient is doing well with the clobetasol and Hydrofera Blue. He does have some irritation and itching around the rest of the leg will use some triamcinolone over this area. 06/18/2021 upon evaluation today patient appears to be doing well with regard to his wound. He is showing signs of excellent improvement and overall I am extremely pleased with where we stand today. We are moving in the right direction. 06/25/2021 upon evaluation today patient appears to be doing well with regard to his wound. In fact  this is doing also not showing signs of excellent improvement overall very pleased with where we stand today. I do not see any evidence of active infection locally or systemically at this time which is also great news. 07/02/2021  upon evaluation patient's wound bed actually showed signs of good granulation and epithelization at this point. And this is measuring significantly smaller and overall I am extremely pleased with where we stand today. No fevers, chills, nausea, vomiting, or diarrhea. 07/09/2021 upon evaluation patient's wound bed actually showed signs of good granulation and epithelization at this point. Fortunately there does not appear to be any evidence of active infection locally or systemically which is great news and overall I am extremely pleased with where we stand at this point. 07/16/2021 upon evaluation today patient appears to be doing well with regard to his wound all things considered although it is maybe a little bit larger than what its been. This does have me a little concerned. Actually question about whether anything is changed and initially he told me RYOTT, RAFFERTY ([id number]) know. In fact it was not until the end of the visit that he actually did mention that he had missed his infusion in January. This very well may be what is because the difference is also seeing currently. That definitely has seem to been helping more recently. 07/23/2021 upon evaluation today patient's wound is yet again larger despite the switch to a collagen which was not beneficial. Subsequently I am going to at this point check on a couple things. First and foremost I do want to go ahead and do a culture. I think that there is a chance he could have a low-lying infection that may be part of the issue here. Subsequently I am also probably can go ahead and place him on Bactrim DS which I think is a good option and has done well with them in the past. Again if this is an infection that should hopefully  help to turn things around. With all that being said I do believe that the patient should hopefully be able to turn this around with reinitiation of the infusion therapy which will be going on this week and subsequently getting on the antibiotic. 3/13; patient presents for follow-up. He has no issues or complaints today. He states he is currently on oral antibiotics for previous culture done in the office. 08/06/2021 upon evaluation today patient appears to be doing well with regard to his wound. I am actually seeing signs of improvement which is great news. I think that he is on a better track he did receive his infusion as well which is great news and overall I think that this should hopefully get him back on track as far as healing is concerned. He is not having any pain and I do not see any signs of inflammation which is great news. 08/13/2021 upon evaluation today patient appears to be doing excellent in regard to his wound on the leg. He has been tolerating the dressing changes without complication. Fortunately I do not see any signs of infection I do believe he is making good progress here and I think her back on track overall the tissue is greatly improved compared to what we have been. 08/20/2021 upon evaluation today patient appears to be doing excellent in regard to his wound. He has been tolerating the dressing changes without complication. Fortunately I do not see any evidence of infection at this time locally or systemically which is great news and overall very pleased with where we stand. 08-31-2021 upon evaluation today patient appears to be doing excellent in regard to his wound. Overall even with the vacation and extra walking he seems to have really done well. Fortunately I do not see  any evidence of active infection locally or systemically which is great news and overall I think we are headed in the right direction. 09-10-2021 upon evaluation today patient's wound is showing signs of  improvement. This is a small improvement but nonetheless we seem to be making progress here. I am very pleased in that regard. There does not appear to be any signs of infection and overall things are doing quite nicely. Upon inspection patient's wound bed actually showed signs of good granulation and epithelization at this point. There was some irritation around the edges of the wound where the good skin was it almost appears that something was rubbing or else is was due from drainage and irritation. Nonetheless I am going to see about putting a little bit of zinc just around the wound opening in order to protect the skin from being damaged. The patient is in agreement with that plan. 5/8; its been a while since I have seen this wound and it looks considerably better although still with some depth. Per our intake nurse the dimensions have improved patient has been using a thin layer of clobetasol, Hydrofera Blue under 4-layer compression. He appears to be making progress 10-01-2021 upon evaluation today patient appears to be doing well with regard to his wound although it is somewhat stalled I feel like were basically at a standstill here. He has previously had Apligraf which did very well for him to be honest. I do believe that he may benefit from a repeat of the Apligraf. In fact it got so small at that point but I do not think we even used all of the applications that were recommended as this had improved to such a degree. Nonetheless I do believe that the patient currently could benefit from repeat treatment with the Apligraf due to the improvement this that we did see. He is not opposed to this and subsequently working to look into getting approval to reinstitute that treatment. 10-08-2021 upon evaluation today patient appears to be doing well with regard to his wound. Overall I feel like he is doing a little bit better from the standpoint of irritation at this time which is great news. I do not  see any signs of active infection locally or systemically which is great news as well. No fevers, chills, nausea, vomiting, or diarrhea. 10-12-2021 upon evaluation today patient appears to be doing well with regard to his wound stable although we are can initiate treatment with Apligraf today. I am hoping this will stimulate things to improve. Patient is in agreement with that plan. 10-19-2028 upon evaluation today patient appears to be doing well with regard to his wound. He has been tolerating the dressing changes without complication. Fortunately there does not appear to be any evidence of active infection locally or systemically at this time which is great news. No fevers, chills, nausea, vomiting, or diarrhea. I do believe the Apligraf has improved the overall appearance of the wound bed receiving some definite improvements here. 10-26-2021 upon evaluation today patient's wound is actually shown some signs of really good improvement I am very pleased in this regard. There is a little bit of slough and biofilm buildup that is stuck to the surface of the wound I am getting very lightly debrided in order to clear this away and then subsequently will get a continue with the Apligraf application today. This is #3. 11-09-2021 upon evaluation today patient appears to be doing well currently in regard to his wound. This is actually showing  signs of significant improvement which is great news and overall I think the Apligraf is doing an amazing job. This wound is significantly smaller compared to the last time I saw him. This will be Apligraf application #4. [id number] upon evaluation today patient appears to be doing well currently in regard to his wound he is actually showing signs of excellent improvement which is great news and overall I am extremely pleased with where we stand today. I do not see any evidence of active infection locally or systemically at this time which is great news. No fevers, chills,  nausea, vomiting, or diarrhea. I believe the Apligraf is doing an awesome job and in fact I [place] see about getting an extension of application which hopefully should not be a problem. Especially in light of how much improvement he has had with regards to the size of the wound with treatment. This is application #5 today. 12-07-2021 upon evaluation today patient appears to be doing excellent in regard to his wound has been tolerating the dressing changes without complication. Fortunately there does not appear to be any signs of active infection locally or systemically at this time. Electronic Signature(s) JAYMAR, LOEBER ([id number]) Signed: 12/07/2021 9:15:50 AM By: Worthy Keeler PA-C Entered By: Worthy Keeler on 12/07/2021 09:15:50 SANG, BLOUNT ([id number]) -------------------------------------------------------------------------------- Physical Exam Details Patient Name: [name] Date of Service: 12/07/2021 8:15 AM Medical Record Number: [id number] Patient Account Number: [id number] Date of Birth/Sex: 1980/06/25 (40 y.o. M) Treating RN: [name] Primary Care Provider: [name] Other Clinician: Massie Kluver Referring Provider: [name] Treating Provider/Extender: [name] Cliche in Treatment: 261 Constitutional Obese and well-hydrated in no acute distress. Respiratory normal breathing without difficulty. Psychiatric this patient is able to make decisions and demonstrates good insight into disease process. Alert and Oriented x 3. pleasant and cooperative. Notes Patient's wound bed showed evidence of good granulation and epithelization currently no debridement was necessary he actually is making excellent progress. We are still waiting to hear back on the remainder of the approval for second round of Apligraf since he is done so well with the first round. Electronic Signature(s) Signed: 12/07/2021 9:16:06 AM By: Worthy Keeler PA-C Entered By: Worthy Keeler  on 12/07/2021 09:16:06 ROBBIN, ESCHER ([id number]) -------------------------------------------------------------------------------- Physician Orders Details Patient Name: [name] Date of Service: 12/07/2021 8:15 AM Medical Record Number: [id number] Patient Account Number: [id number] Date of Birth/Sex: Sep 26, 1980 (40 y.o. M) Treating RN: [name] Primary Care Provider: [name] Other Clinician: Massie Kluver Referring Provider: [name] Treating Provider/Extender: [name] Cliche in Treatment: [phone] Verbal / Phone Orders: No Diagnosis Coding ICD-10 Coding Code Description I87.2 Venous insufficiency (chronic) (peripheral) L97.222 Non-pressure chronic ulcer of left calf with fat layer exposed E11.622 Type 2 diabetes mellitus with other skin ulcer L88 Pyoderma gangrenosum F17.208 Nicotine dependence, unspecified, with other nicotine-induced disorders Follow-up Appointments o Return Appointment in 1 week. o Nurse Visit as needed - twice a week Bathing/ Shower/ Hygiene o Clean wound with Normal Saline or wound cleanser. o May shower with wound dressing protected with water repellent cover or cast protector. o No tub bath. Cellular or Tissue Based Products Wound #1 Left,Lateral Lower Leg o Cellular or Tissue Based Product applied to wound bed; including contact layer, fixation with steri-strips, dry gauze and cover dressing. (DO NOT REMOVE). - no apligraf applied today, waiting on approval Edema Control - Lymphedema / Segmental Compressive Device / Other o Optional: One layer of unna paste to top of compression wrap (to act  as an anchor). - He needs this to hold it up o Elevate, Exercise Daily and Avoid Standing for Long Periods of Time. o Elevate legs to the level of the heart and pump ankles as often as possible o Elevate leg(s) parallel to the floor when sitting. Wound Treatment Wound #1 - Lower Leg Wound Laterality: Left, Lateral Cleanser:  Wound Cleanser 3 x Per Week/30 Days Discharge Instructions: Wash your hands with soap and water. Remove old dressing, discard into plastic bag and place into trash. Cleanse the wound with Wound Cleanser prior to applying a clean dressing using gauze sponges, not tissues or cotton balls. Do not scrub or use excessive force. Pat dry using gauze sponges, not tissue or cotton balls. Peri-Wound Care: Desitin Maximum Strength Ointment 4 (oz) 3 x Per Week/30 Days Discharge Instructions: Apply around wound edges to avoid maceration Peri-Wound Care: Triamcinolone Acetonide Cream, 0.1%, 15 (g) tube 3 x Per Week/30 Days Discharge Instructions: Apply to leg Topical: Clobetasol Propionate ointment 0.05%, 60 (g) tube 3 x Per Week/30 Days Discharge Instructions: Pt to bring to appt- applied to wound bed Secondary Dressing: (NON-BORDER) Zetuvit Plus Silicone NON-BORDER 5x5 (in/in) 3 x Per Week/30 Days Compression Wrap: Medichoice 4 layer Compression System, 35-40 mmHG 3 x Per Week/30 Days Discharge Instructions: Apply multi-layer wrap as directed. Electronic Signature(s) Signed: 12/07/2021 4:22:23 PM By: Hedy Camara, Chozen ([id number]) Signed: 12/07/2021 4:34:39 PM By: Massie Kluver Entered By: Massie Kluver on 12/07/2021 08:44:01 VIMAL, DEREGO ([id number]) -------------------------------------------------------------------------------- Problem List Details Patient Name: [name] Date of Service: 12/07/2021 8:15 AM Medical Record Number: [id number] Patient Account Number: [id number] Date of Birth/Sex: [birth year] (40 y.o. M) Treating RN: [name] Primary Care Provider: [name] Other Clinician: Massie Kluver Referring Provider: [name] Treating Provider/Extender: [name] Cliche in Treatment: 261 Active Problems ICD-10 Encounter Code Description Active Date MDM Diagnosis I87.2 Venous insufficiency (chronic) (peripheral) 12/04/2016 No Yes L97.222 Non-pressure  chronic ulcer of left calf with fat layer exposed 12/04/2016 No Yes E11.622 Type 2 diabetes mellitus with other skin ulcer 04/09/2021 No Yes L88 Pyoderma gangrenosum 03/26/2017 No Yes F17.208 Nicotine dependence, unspecified, with other nicotine-induced disorders 04/24/2020 No Yes Inactive Problems ICD-10 Code Description Active Date Inactive Date L97.213 Non-pressure chronic ulcer of right calf with necrosis of muscle 04/02/2017 04/02/2017 Resolved Problems ICD-10 Code Description Active Date Resolved Date L97.321 Non-pressure chronic ulcer of left ankle limited to breakdown of skin 10/08/2019 10/08/2019 L03.116 Cellulitis of left lower limb 05/24/2019 05/24/2019 Electronic Signature(s) Signed: 12/07/2021 8:17:21 AM By: Worthy Keeler PA-C Entered By: Worthy Keeler on 12/07/2021 08:17:21 ZAVIOR, THOMASON ([id number]) -------------------------------------------------------------------------------- Progress Note Details Patient Name: [name] Date of Service: 12/07/2021 8:15 AM Medical Record Number: [id number] Patient Account Number: [id number] Date of Birth/Sex: 1980/12/18 (40 y.o. M) Treating RN: [name] Primary Care Provider: [name] Other Clinician: Massie Kluver Referring Provider: [name] Treating Provider/Extender: [name] Cliche in Treatment: 261 Subjective Chief Complaint Information obtained from Patient He is here in follow up evaluation for LLE pyoderma ulcer History of Present Illness (HPI) 12/04/16; 41 year old man who comes into the clinic today for review of a wound on the posterior left calf. He tells me that is been there for about a year. He is not a diabetic he does smoke half a pack per day. He was seen in the ER on 11/20/16 felt to have cellulitis around the wound and was given clindamycin. An x-ray did not show osteomyelitis. The patient initially tells me that he has  a milk allergy that sets off a pruritic itching rash on his lower legs which  she scratches incessantly and he thinks that's what may have set up the wound. He has been using various topical antibiotics and ointments without any effect. He works in a [employer]ing [employer] and is on his feet all day. He does not have a prior history of wounds however he does have the rash on both lower legs the right arm and the ventral aspect of his left arm. These are excoriations and clearly have had scratching however there are of macular looking areas on both legs including a substantial larger area on the right leg. This does not have an underlying open area. There is no blistering. The patient tells me that 2 years ago in [place] in response to the rash on his legs he saw a dermatologist who told him he had a condition which may be pyoderma gangrenosum although I may be putting words into his mouth. He seemed to recognize this. 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 He is not in any treatment for this. He's had no recent travel 12/11/16; the patient arrives today with his wound and roughly the same condition we've been using silver alginate this is a deep punched out wound with some surrounding erythema but no tenderness. Biopsy I did did not show confirmed pyoderma gangrenosum suggested nonspecific inflammation and vasculitis but does not provide an actual description of what was seen by the pathologist. I'm really not able to understand this We have also received information from the patient's dermatologist in [place] notes from April 2016. This was a doctor Agarwal-antal. 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 He was prescribed topical steroid high potency under occlusion which helped but at this point the patient did not have a deep punched out wound. 12/18/16; the patient's wound is larger in terms of surface area however this surface looks better and there is less depth. The surrounding erythema also is better. The patient states  that the wrap we put on came off 2 days ago when he has been using his compression stockings. He we are in the process of getting a dermatology consult. 12/26/16 on evaluation today patient's left lower extremity wound shows evidence of infection with surrounding erythema noted. He has been tolerating the dressing changes but states that he has noted more discomfort. There is a larger area of erythema surrounding the wound. No fevers, chills, nausea, or vomiting noted at this time. With that being said the wound still does have slough covering the surface. He is not allergic to any medication that he is aware of at this point. In regard to his right lower extremity he had several regions that are erythematous and pruritic he wonders if there's anything we can do to help that. 01/02/17 I reviewed patient's wound culture which was obtained his visit last week. He was placed on doxycycline at that point. Unfortunately that does not appear to be an antibiotic that would likely help with the situation however the pseudomonas noted on culture is sensitive to Cipro. Also unfortunately patient's wound seems to have a large compared to last week's evaluation. Not severely so but there are definitely increased measurements in general. He is continuing to have discomfort as well he writes this to be a seven out of 10. In fact he would prefer me not to perform any debridement today due to the fact that he is having discomfort and  considering he has an active infection on the little reluctant to do so anyway. No fevers, chills, nausea, or vomiting noted at this time. 01/08/17; patient seems dermatology on September 5. I suspect dermatology will want the slides from the biopsy I did sent to their pathologist. I'm not sure if there is a way we can expedite that. In any case the culture I did before I left on vacation 3 weeks ago showed Pseudomonas he was given 10 days of Cipro and per her description of her intake  nurses is actually somewhat better this week although the wound is quite a bit bigger than I remember the last time I saw this. He still has 3 more days of Cipro 01/21/17; dermatology appointment tomorrow. He has completed the ciprofloxacin for Pseudomonas. Surface of the wound looks better however he is had some deterioration in the lesions on his right leg. Meantime the left lateral leg wound we will continue with sample 01/29/17; patient had his dermatology appointment but I can't yet see that note. He is completed his antibiotics. The wound is more superficial but considerably larger in circumferential area than when he came in. This is in his left lateral calf. He also has swollen erythematous areas with superficial wounds on the right leg and small papular areas on both arms. There apparently areas in her his upper thighs and buttocks I did not look at those. Dermatology biopsied the right leg. Hopefully will have their input next week. 02/05/17; patient went back to see his dermatologist who told him that he had a "scratching problem" as well as staph. He is now on a 30 day course of doxycycline and I believe she gave him triamcinolone cream to the right leg areas to help with the itching [not exactly sure but probably triamcinolone]. She apparently looked at the left lateral leg wound although this was not rebiopsied and I think felt to be ultimately part of the same pathogenesis. He is using sample border foam and changing nevus himself. He now has a new open area on the right posterior leg which was his biopsy site I don't have any of the dermatology notes 02/12/17; we put the patient in compression last week with SANTYL to the wound on the left leg and the biopsy. Edema is much better and the depth of the wound is now at level of skin. Area is still the same Biopsy site on the right lateral leg we've also been using santyl with a border foam dressing and he is changing this himself. 02/19/17;  Using silver alginate started last week to both the substantial left leg wound and the biopsy site on the right wound. He is tolerating compression well. Has a an appointment with his primary M.D. tomorrow wondering about diuretics although I'm wondering if the edema problem is actually lymphedema MAR, WALMER ([id number]) 02/26/17; the patient has been to see his primary doctor Dr. Jerrel Ivory at [facility] our primary care. She started him on Lasix 20 mg and this seems to have helped with the edema. However we are not making substantial change with the left lateral calf wound and inflammation. The biopsy site on the right leg also looks stable but not really all that different. 03/12/17; the patient has been to see vein and vascular Dr. Lucky Cowboy. He has had venous reflux studies I have not reviewed these. I did get a call from his dermatology office. They felt that he might have pathergy based on their biopsy on his right leg which  led them to look at the slides of the biopsy I did on the left leg and they wonder whether this represents pyoderma gangrenosum which was the original supposition in a man with ulcerative colitis albeit inactive for many years. They therefore recommended clobetasol and tetracycline i.e. aggressive treatment for possible pyoderma gangrenosum. 03/26/17; apparently the patient just had reflux studies not an appointment with Dr. dew. She arrives in clinic today having applied clobetasol for 2-3 weeks. He notes over the last 2-3 days excessive drainage having to change the dressing 3-4 times a day and also expanding erythema. 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 Lillia Carmel at [facility] dermatology locally who kindly saw him at my  request. 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 My concern was the degree of erythema medially extending from his left leg wound which was either inflammation from pyoderma or cellulitis. I put him on Augmentin however culture of the wound showed Pseudomonas which is quinolone sensitive. I really don't believe he has cellulitis however in view of everything I will continue and give him a course of Cipro. He is also on doxycycline as an immune modulator for the pyoderma. In addition to his original wound on the left lateral leg with surrounding erythema he has a wound on the right posterior calf which was an original biopsy site done by dermatology. This was felt to represent pathergy from pyoderma gangrenosum 04/16/17; pyoderma gangrenosum. Saw Dr. Lillia Carmel yesterday. He has been using topical antibiotics to both wound areas his original wound on the left and the biopsies/pathergy area on the right. There is definitely some improvement in the inflammation around the wound on the right although the patient states he has increasing sensitivity of the wounds. He is on prednisone 60 and doxycycline 1 as prescribed by Dr. Lillia Carmel. He is covering the topical antibiotic with gauze and putting this in his own compression stocks and changing this daily. He states that Dr. Lottie Rater did a culture of the left leg wound yesterday 05/07/17; pyoderma gangrenosum. The patient saw Dr. Lillia Carmel yesterday and has a follow-up with her in one month. He is still using topical antibiotics to both wounds although he can't recall exactly what type. He is still on prednisone 60 mg. Dr. Lillia Carmel stated that the doxycycline could stop if we were in agreement. 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 The right medial leg was induced by  biopsy/pathergy. The area on the right is essentially healed. 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 The right medial leg has closed and remains closed. He is still on prednisone 60. He tells me he missed his last dermatology appointment with Dr. Lillia Carmel but will make another appointment. He reports that her blood sugar at a recent screen in [place] was high 200's. He was 180 today. 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 I think he is going to require still much longer prednisone perhaps another 3 months before attempting to taper. In the meantime his wound is a lot better. Smaller.  He is cleaning this off daily and applying topical antibiotics. When he was last in the clinic I thought about changing to [facility] and actually put in a couple of calls to dermatology although probably not during their business hours. 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 He continues on oral prednisone. He has been using triple antibiotic ointment. There is surface debris and we will transition to [facility] and have him return in 2 weeks. He has lost 30 pounds since his last appointment with lifestyle modification. He may benefit from topical steroid cream for treatment this can be considered at a later date. 08/22/17 on evaluation today patient appears to actually be doing rather well in regard to his left lateral lower extremity ulcer. He has actually been managed by Dr. Dellia Nims most recently. Patient is currently on oral steroids at this time. This seems to have been of benefit for him. Nonetheless his last visit was actually with Leah on 08/06/17. Currently he is not utilizing any topical steroid creams although this could be of benefit as well. No fevers, chills, nausea, or  vomiting noted at this time. 09/05/17 on evaluation today patient appears to be doing better in regard to his left lateral lower extremity ulcer. He has been tolerating the dressing changes without complication. He is using Santyl with good effect. Overall I'm very pleased with how things are standing at this point. Patient likewise is happy that this is doing better. 09/19/17 on evaluation today patient actually appears to be doing rather well in regard to his left lateral lower extremity ulcer. Again this is secondary to Pyoderma gangrenosum and he seems to be progressing well with the Santyl which is good news. He's not having any significant pain. 10/03/17 on evaluation today patient appears to be doing excellent in regard to his lower extremity wound on the left secondary to Pyoderma gangrenosum. He has been tolerating the Santyl without complication and in general I feel like he's making good progress. 10/17/17 on evaluation today patient appears to be doing very well in regard to his left lateral lower surety ulcer. He has been tolerating the dressing changes without complication. There does not appear to be any evidence of infection he's alternating the Santyl and the triple antibiotic ointment every other day this seems to be doing well for him. 11/03/17 on evaluation today patient appears to be doing very well in regard to his left lateral lower extremity ulcer. He is been tolerating the dressing changes without complication which is good news. Fortunately there does not appear to be any evidence of infection which is also great news. Overall is doing excellent they are starting to taper down on the prednisone is down to 40 mg at this point it also started topical clobetasol for him. 11/17/17 on evaluation today patient appears to be doing well in regard to his left lateral lower surety ulcer. He's been tolerating the dressing changes without complication. He does note that he is having no pain,  no excessive drainage or discharge, and overall he feels like things are going about how he would expect and hope they would. Overall he seems to have no evidence of infection at this time in my opinion which is good news. 12/04/17-He is seen in follow-up evaluation for right lateral lower extremity ulcer. He has been applying topical steroid cream. Today's measurement show  slight increase in size. Over the next 2 weeks we will transition to every other day Santyl and steroid cream. He has been encouraged to monitor for changes and notify clinic with any concerns 12/15/17 on evaluation today patient's left lateral motion the ulcer and fortunately is doing worse again at this point. This just since last week to this week has close to doubled in size according to the patient. I did not seeing last week's I do not have a visual to compare this to in our system was also down so we do not have all the charts and at this point. Nonetheless it does have me somewhat concerned in regard to the fact that again he was worried enough about it he has contact the dermatology that placed them back on the full strength, 50 mg a day of the prednisone that he was taken previous. He continues to alternate using clobetasol along with Santyl at this point. He is obviously somewhat frustrated. RANCE, SMITHSON ([id number]) 12/22/17 on evaluation today patient appears to be doing a little worse compared to last evaluation. Unfortunately the wound is a little deeper and slightly larger than the last week's evaluation. With that being said he has made some progress in regard to the irritation surrounding at this time unfortunately despite that progress that's been made he still has a significant issue going on here. 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 For that reason I  discussed with him today the possibility of perform a wound culture to ensure there's nothing overtly infected. 01/06/18 on evaluation today patient's wound is larger and deeper than previously evaluated. With that being said it did appear that his wound was infected after my last evaluation with him. Subsequently I did end up prescribing a prescription for Bactrim DS which she has been taking and having no complication with. Fortunately there does not appear to be any evidence of infection at this point in time as far as anything spreading, no want to touch, and overall I feel like things are showing signs of improvement. 01/13/18 on evaluation today patient appears to be even a little larger and deeper than last time. There still muscle exposed in the base of the wound. Nonetheless he does appear to be less erythematous I do believe inflammation is calming down also believe the infection looks like it's probably resolved at this time based on what I'm seeing. No fevers, chills, nausea, or vomiting noted at this time. 01/30/18 on evaluation today patient actually appears to visually look better for the most part. Unfortunately those visually this looks better he does seem to potentially have what may be an abscess in the muscle that has been noted in the central portion of the wound. This is the first time that I have noted what appears to be fluctuance in the central portion of the muscle. With that being said I'm somewhat more concerned about the fact that this might indicate an abscess formation at this location. I do believe that an ultrasound would be appropriate. This is likely something we need to try to do as soon as possible. He has been switch to mupirocin ointment and he is no longer using the steroid ointment as prescribed by dermatology he sees them again next week he's been decreased from 60 to 40 mg of prednisone. 03/09/18 on evaluation today patient actually appears to be  doing a little  better compared to last time I saw him. There's not as much erythema surrounding the wound itself. He I did review his most recent infectious disease note which was dated 02/24/18. He saw Dr. Michel Bickers in [place]. 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 Patient is now off of antibiotics and they are continually observing this. There seems to be no change in the past two weeks in my pinion based on what the patient says and what I see today compared to what Dr. Megan Salon likely saw two weeks ago. No fevers, chills, nausea, or vomiting noted at this time. 03/23/18 on evaluation today patient's wound actually appears to be showing signs of improvement which is good news. He is currently still on the Dapsone. He is also working on tapering the prednisone to get off of this and Dr. Lottie Rater is working with him in this regard. Nonetheless overall I feel like the wound is doing well it does appear based on the infectious disease note that I reviewed from Dr. 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 I did also review the results of his ultrasound of left lower extremity which revealed there was a dentist tissue in the base of the wound without an abscess noted. 04/06/18 on evaluation today the patient's left lateral lower extremity ulcer actually appears to be doing fairly well which is excellent news. There does not appear to be any evidence of infection at this time which is also great news. Overall he still does have a significantly large ulceration although little by little he seems to be making progress. He is down to 10 mg a day of the prednisone. 04/20/18 on evaluation today patient actually appears to be doing excellent at this time in regard to his left lower extremity ulcer. He's  making signs of good progress unfortunately this is taking much longer than we would really like to see but nonetheless he is making progress. Fortunately there does not appear to be any evidence of infection at this time. No fevers, chills, nausea, or vomiting noted at this time. The patient has not been using the Santyl due to the cost he hadn't got in this field yet. He's mainly been using the antibiotic ointment topically. Subsequently he also tells me that he really has not been scrubbing in the shower I think this would be helpful again as I told him it doesn't have to be anything too aggressive to even make it believe just enough to keep it free of some of the loose slough and biofilm on the wound surface. 05/11/18 on evaluation today patient's wound appears to be making slow but sure progress in regard to the left lateral lower extremity ulcer. He is been tolerating the dressing changes without complication. Fortunately there does not appear to be any evidence of infection at this time. He is still just using triple antibiotic ointment along with clobetasol occasionally over the area. He never got the Santyl and really does not seem to intend to in my pinion. 06/01/18 on evaluation today patient appears to be doing a little better in regard to his left lateral lower extremity ulcer. He states that overall he does not feel like he is doing as well with the Dapsone as he did with the  prednisone. Nonetheless he sees his dermatologist later today and is gonna talk to them about the possibility of going back on the prednisone. Overall again I believe that the wound would be better if you would utilize Santyl but he really does not seem to be interested in going back to the [place] at this point. He has been using triple antibiotic ointment. 06/15/18 on evaluation today patient's wound actually appears to be doing about the same at this point. Fortunately there is no signs of infection at this time. He  has made slight improvements although he continues to not really want to clean the wound bed at this point. He states that he just doesn't mess with it he doesn't want to cause any problems with everything else he has going on. He has been on medication, antibiotics as prescribed by his dermatologist, for a staff infection of his lower extremities which is really drying out now and looking much better he tells me. Fortunately there is no sign of overall infection. 06/29/18 on evaluation today patient appears to be doing well in regard to his left lateral lower surety ulcer all things considering. Fortunately his staff infection seems to be greatly improved compared to previous. He has no signs of infection and this is drying up quite nicely. He is still the doxycycline for this is no longer on cental, Dapsone, or any of the other medications. His dermatologist has recommended possibility of an infusion but right now he does not want to proceed with that. 07/13/18 on evaluation today patient appears to be doing about the same in regard to his left lateral lower surety ulcer. Fortunately there's no signs of infection at this time which is great news. Unfortunately he still builds up a significant amount of Slough/biofilm of the surface of the wound he still is not really cleaning this as he should be appropriately. Again I'm able to easily with saline and gauze remove the majority of this on the surface which if you would do this at home would likely be a dramatic improvement for him as far as getting the area to improve. Nonetheless overall I still feel like he is making progress is just very slow. I think Santyl will be of benefit for him as well. Still he has not gotten this as of this point. 07/27/18 on evaluation today patient actually appears to be doing little worse in regards of the erythema around the periwound region of the wound he also tells me that he's been having more drainage currently  compared to what he was experiencing last time I saw him. He states not quite as bad as what he had because this was infected previously but nonetheless is still appears to be doing poorly. Fortunately there is no evidence of systemic infection at this point. The patient tells me that he is not going to be able to afford the Santyl. He is still waiting to hear about the infusion therapy with his dermatologist. Apparently she wants an updated colonoscopy first. BION, TODOROV ([id number]) 08/10/18 on evaluation today patient appears to be doing better in regard to his left lateral lower extremity ulcer. Fortunately he is showing signs of improvement in this regard he's actually been approved for Remicade infusion's as well although this has not been scheduled as of yet. Fortunately there's no signs of active infection at this time in regard to the wound although he is having some issues with infection of the right lower extremity is been seen as dermatologist for this.  Fortunately they are definitely still working with him trying to keep things under control. 09/07/18 on evaluation today patient is actually doing rather well in regard to his left lateral lower extremity ulcer. He notes these actually having some hair grow back on his extremity which is something he has not seen in years. He also tells me that the pain is really not giving them any trouble at this time which is also good news overall she is very pleased with the progress he's using a combination of the mupirocin along with the probate is all mixed. 09/21/18 on evaluation today patient actually appears to be doing fairly well all things considered in regard to his looks from the ulcer. He's been tolerating the dressing changes without complication. Fortunately there's no signs of active infection at this time which is good news he is still on all antibiotics or prevention of the staff infection. He has been on prednisone for time although he  states it is gonna contact his dermatologist and see if she put them on a short course due to some irritation that he has going on currently. Fortunately there's no evidence of any overall worsening this is going very slow I think cental would be something that would be helpful for him although he states that $50 for tube is quite expensive. He therefore is not willing to get that at this point. 10/06/18 on evaluation today patient actually appears to be doing decently well in regard to his left lateral leg ulcer. He's been tolerating the dressing changes without complication. Fortunately there's no signs of active infection at this time. Overall I'm actually rather pleased with the progress he's making although it's slow he doesn't show any signs of infection and he does seem to be making some improvement. I do believe that he may need a switch up and dressings to try to help this to heal more appropriately and quickly. 10/19/18 on evaluation today patient actually appears to be doing better in regard to his left lateral lower extremity ulcer. This is shown signs of having much less Slough buildup at this point due to the fact he has been using the [employer]. Obviously this is very good news. The overall size of the wound is not dramatically smaller but again the appearance is. 11/02/18 on evaluation today patient actually appears to be doing quite well in regard to his lower [place] ulcer. A lot of the skin around the ulcer is actually somewhat irritating at this point this seems to be more due to the dressing causing irritation from the adhesive that anything else. Fortunately there is no signs of active infection at this time. 11/24/18 on evaluation today patient appears to be doing a little worse in regard to his overall appearance of his lower extremity ulcer. There's more erythema and warmth around the wound unfortunately. He is currently on doxycycline which he has been on for some time. With that  being said I'm not sure that seems to be helping with what appears to possibly be an acute cellulitis with regard to his left lower extremity ulcer. No fevers, chills, nausea, or vomiting noted at this time. 12/08/18 on evaluation today patient's wounds actually appears to be doing significantly better compared to his last evaluation. He has been using Santyl along with alternating tripling about appointment as well as the steroid cream seems to be doing quite well and the wound is showing signs of improvement which is excellent news. Fortunately there's no evidence of infection and in  fact his culture came back negative with only normal skin flora noted. 12/21/2018 upon evaluation today patient actually appears to be doing excellent with regard to his ulcer. This is actually the best that I have seen it since have been helping to take care of him. It is both smaller as well as less slough noted on the surface of the wound and seems to be showing signs of good improvement with new skin growing from the edges. He has been using just the triamcinolone he does wonder if he can get a refill of that ointment today. 01/04/2019 upon evaluation today patient actually appears to be doing well with regard to his left lateral lower extremity ulcer. With that being said it does not appear to be that he is doing quite as well as last time as far as progression is concerned. There does not appear to be any signs of infection or significant irritation which is good news. With that being said I do believe that he may benefit from switching to a collagen based dressing based on how clean The wound appears. 01/18/2019 on evaluation today patient actually appears to be doing well with regard to his wound on the left lower extremity. He is not made a lot of progress compared to where we were previous but nonetheless does seem to be doing okay at this time which is good news. There is no signs of active infection which is  also good news. My only concern currently is I do wish we can get him into utilizing the collagen dressing his insurance would not pay for the supplies that we ordered although it appears that he may be able to order this through his supply company that he typically utilizes. This is [employer]. Nonetheless he did try to order it during the office visit today and it appears this did go through. We will see if he can get that it is a different brand but nonetheless he has collagen and I do think will be beneficial. 02/01/2019 on evaluation today patient actually appears to be doing a little worse today in regard to the overall size of his wounds. Fortunately there is no signs of active infection at this time. That is visually. Nonetheless when this is happened before it was due to infection. For that reason were somewhat concerned about that this time as well. 02/08/2019 on evaluation today patient unfortunately appears to be doing slightly worse with regard to his wound upon evaluation today. Is measuring a little deeper and a little larger unfortunately. I am not really sure exactly what is causing this to enlarge he actually did see his dermatologist she is going to see about initiating Humira for him. Subsequently she also did do steroid injections into the wound itself in the periphery. Nonetheless still nonetheless he seems to be getting a little bit larger he is gone back to just using the steroid cream topically which I think is appropriate. I would say hold off on the collagen for the time being is definitely a good thing to do. Based on the culture results which we finally did get the final result back regarding it shows staph as the bacteria noted again that can be a normal skin bacteria based on the fact however he is having increased drainage and worsening of the wound measurement wise I would go ahead and place him on an antibiotic today I do believe for this. 02/15/2019 on evaluation today  patient actually appears to be doing somewhat better in  regard to his ulcer. There is no signs of worsening at this time I did review his culture results which showed evidence of Staphylococcus aureus but not MRSA. Again this could just be more related to the normal skin bacteria although he states the drainage has slowed down quite a bit he may have had a mild infection not just colonization. And was much smaller and then since around10/04/2019 on evaluation today patient appears to be doing unfortunately worse as far as the size of the wound. I really feel like that this is steadily getting larger again it had been doing excellent right at the beginning of September we have seen a steady increase in the area of the wound it is almost 2-1/2 times the size it was on September 1. Obviously this is a bad trend this is not wanting to see. For that reason we went back to using just the topical triamcinolone cream which does seem to help with inflammation. I checked him for bacteria by way of culture and nothing showed positive there. I am considering giving him a short course of a tapering steroid Silvestre Mines, Harce ([id number]) today to see if that is can be beneficial for him. The patient is in agreement with giving that a try. 03/08/2019 on evaluation today patient appears to be doing very well in comparison to last evaluation with regard to his lower extremity ulcer. This is showing signs of less inflammation and actually measuring slightly smaller compared to last time every other week over the past month and a half he has been measuring larger larger larger. Nonetheless I do believe that the issue has been inflammation the prednisone does seem to have been beneficial for him which is good news. No fevers, chills, nausea, vomiting, or diarrhea. 03/22/2019 on evaluation today patient appears to be doing about the same with regard to his leg ulcer. He has been tolerating the dressing changes without  complication. With that being said the wound seems to be mostly arrested at its current size but really is not making any progress except for when we prescribed the prednisone. He did show some signs of dropping as far as the overall size of the wound during that interval week. Nonetheless this is something he is not on long-term at this point and unfortunately I think he is getting need either this or else the Humira which his dermatologist has discussed try to get approval for. With that being said he will be seeing his dermatologist on the 11th of this month that is November. 04/19/2019 on evaluation today patient appears to be doing really about the same the wound is measuring slightly larger compared to last time I saw him. He has not been into the office since November 2 due to the fact that he unfortunately had Covid as that his entire family. He tells me that it was rough but they did pull-through and he seems to be doing much better. Fortunately there is no signs of active infection at this time. No fevers, chills, nausea, vomiting, or diarrhea. 05/10/2019 on evaluation today patient unfortunately appears to be doing significantly worse as compared to last time I saw him. He does tell me that he has had his first dose of Humira and actually is scheduled to get the next one in the upcoming week. With that being said he tells me also that in the past several days he has been having a lot of issues with green drainage she showed me a picture this is  more blue-green in color. He is also been having issues with increased sloughy buildup and the wound does appear to be larger today. Obviously this is not the direction that we want everything to take based on the starting of his Humira. Nonetheless I think this is definitely a result of likely infection and to be honest I think this is probably Pseudomonas causing the infection based on what I am seeing. 05/24/2019 on evaluation today patient  unfortunately appears to be doing significantly worse compared to his prior evaluation with me 2 weeks ago. I did review his culture results which showed that he does have Staph aureus as well as Pseudomonas noted on the culture. Nonetheless the Levaquin that I prescribed for him does not appear to have been appropriate and in fact he tells me he is no longer experiencing the green drainage and discharge that he had at the last visit. Fortunately there is no signs of active infection at this time which is good news although the wound has significantly worsened it in fact is much deeper than it was previous. We have been utilizing up to this point triamcinolone ointment as the prescription topical of choice but at this time I really feel like that the wound is getting need to be packed in order to appropriately manage this due to the deeper nature of the wound. Therefore something along the lines of an alginate dressing may be more appropriate. 05/31/2019 upon inspection today patient's wound actually showed signs of doing poorly at this point. Unfortunately he just does not seem to be making any good progress despite what we have tried. He actually did go ahead and pick up the Cipro and start taking that as he was noticing more green drainage he had previously completed the Levaquin that I prescribed for him as well. Nonetheless he missed his appointment for the seventh last week on Wednesday with the wound care center and [facility] where his dermatologist referred him. Obviously I do think a second opinion would be helpful at this point especially in light of the fact that the patient seems to be doing so poorly despite the fact that we have tried everything that I really know how at this point. The only thing that ever seems to have helped him in the past is when he was on high doses of continual steroids that did seem to make a difference for him. Right now he is on immune modulating medication  to try to help with the pyoderma but I am not sure that he is getting as much relief at this point as he is previously obtained from the use of steroids. 06/07/2019 upon evaluation today patient unfortunately appears to be doing worse yet again with regard to his wound. In fact I am starting to question whether or not he may have a fluid pocket in the muscle at this point based on the bulging and the soft appearance to the central portion of the muscle area. There is not anything draining from the muscle itself at this time which is good news but nonetheless the wound is expanding. I am not really seeing any results of the Humira as far as overall wound progression based on what I am seeing at this point. The patient has been referred for second opinion with regard to his wound to the [facility] wound care center by his dermatologist which I definitely am not in opposition to. Unfortunately we tried multiple dressings in the past including collagen, alginate, and  at one point even [facility]. With that being said he is never really used it for any significant amount of time due to the fact that he often complains of pain associated with these dressings and then will go back to either using the Santyl which she has done intermittently or more frequently the triamcinolone. He is also using his own compression stockings. We have wrapped him in the past but again that was something else that he really was not a big fan of. 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 He seemed to do much better when he was on the steroids although honestly I understand that the steroids are not necessarily  the best medication to be on long-term obviously 06/14/2019 on evaluation today patient appears to be doing actually a little bit better with regard to the overall appearance with his leg. Unfortunately he does continue to have issues with what appears to be some fluid underneath the muscle although he did see the wound specialty center at [facility] last week their main goals were to see about infusion therapy in place of the Humira as they feel like that is not quite strong enough. They also recommended that we continue with the treatment otherwise as we are they felt like that was appropriate and they are okay with him continuing to follow-up here with [place] in that regard. With that being said they are also sending him to the vein specialist there to see about vein stripping and if that would be of benefit for him. Subsequently they also did not really address whether or not an ultrasound of the muscle area to see if there is anything that needs to be addressed here would be appropriate or not. For that reason I discussed this with him last week I think we may proceed down that road at this point. 06/21/2019 upon evaluation today patient's wound actually appears to be doing slightly better compared to previous evaluations. I do believe that he has made a difference with regard to the progression here with the use of oral steroids. Again in the past has been the only thing that is really calm things down. He does tell me that from [facility] is gotten a good news from there that there are no further vein stripping that is necessary at this point. I do not have that available for review today although the patient did relay this to me. He also did obtain and have the ultrasound of the wound completed which I did sign off on today. It does appear that there is no fluid collection under the muscle this is likely then just edematous tissue in general. That is also good news. Overall I still believe the inflammation is the  main issue here. He did inquire about the possibility of a wound VAC again with the muscle protruding like it is I am not really sure whether the wound VAC is necessarily ideal or not. That is something we will have to consider although I do believe he may need compression wrapping to try to help with edema control which could potentially be of benefit. 06/28/2019 on evaluation today patient appears to  be doing slightly better measurement wise although this is not terribly smaller he least seems to be trending towards that direction. With that being said he still seems to have purulent drainage noted in the wound bed at this time. He has been on Levaquin followed by Cipro over the past month. Unfortunately he still seems to have some issues with active infection at this time. I did perform a culture last week in order to evaluate and see if indeed there was still anything going on. Subsequently the culture did come back Cuthbert, Aylan ([id number]) showing Pseudomonas which is consistent with the drainage has been having which is blue-green in color. He also has had an odor that again was somewhat consistent with Pseudomonas as well. Long story short it appears that the culture showed an intermediate finding with regard to how well the Cipro will work for the Pseudomonas infection. Subsequently being that he does not seem to be clearing up and at best what we are doing is just keeping this at [place] I think he may need to see infectious disease to discuss IV antibiotic options. 07/05/2019 upon evaluation today patient appears to be doing okay in regard to his leg ulcer. He has been tolerating the dressing changes at this point without complication. Fortunately there is no signs of active infection at this time which is good news. No fevers, chills, nausea, vomiting, or diarrhea. With that being said he does have an appointment with infectious disease tomorrow and his primary care on Wednesday. Again  the reason for the infectious disease referral was due to the fact that he did not seem to be fully resolving with the use of oral antibiotics and therefore we were thinking that IV antibiotic therapy may be necessary secondary to the fact that there was an intermediate finding for how effective the Cipro may be. Nonetheless again he has been having a lot of purulent and even green drainage. Fortunately right now that seems to have calmed down over the past week with the reinitiation of the oral antibiotic. Nonetheless we will see what Dr. Megan Salon has to say. 07/12/2019 upon evaluation today patient appears to be doing about the same at this point in regard to his left lower extremity ulcer. Fortunately there is no signs of active infection at this time which is good news I do believe the Levaquin has been beneficial I did review Dr. Hale Bogus note and to be honest I agree that the patient's leg does appear to be doing better currently. What we found in the past as he does not seem to really completely resolve he will stop the antibiotic and then subsequently things will revert back to having issues with blue-green drainage, increased pain, and overall worsening in general. Obviously that is the reason I sent him back to infectious disease. 07/19/2019 upon evaluation today patient appears to be doing roughly the same in size there is really no dramatic improvement. He has started back on the Levaquin at this point and though he seems to be doing okay he did still have a lot of blue/green drainage noted on evaluation today unfortunately. I think that this is still indicative more likely of a Pseudomonas infection as previously noted and again he does see Dr. Megan Salon in just a couple of days. I do not know that were really able to effectively clear this with just oral antibiotics alone based on what I am seeing currently. Nonetheless we are still continue to try to manage as best we can  with regard to  the patient and his wound. I do think the wrap was helpful in decreasing the edema which is excellent news. No fevers, chills, nausea, vomiting, or diarrhea. 07/26/2019 upon evaluation today patient appears to be doing slightly better with regard to the overall appearance of the muscle there is no dark discoloration centrally. Fortunately there is no signs of active infection at this time. No fevers, chills, nausea, vomiting, or diarrhea. Patient's wound bed currently the patient did have an appointment with Dr. Megan Salon at infectious disease last week. With that being said Dr. Megan Salon the patient states was still somewhat hesitant about put him on any IV antibiotics he wanted [place] to repeat cultures today and then see where things go going forward. He does look like Dr. Megan Salon because of some improvement the patient did have with the Levaquin wanted [place] to see about repeating cultures. If it indeed grows the Pseudomonas again then he recommended a possibility of considering a PICC line placement and IV antibiotic therapy. He plans to see the patient back in 1 to 2 weeks. 08/02/2019 upon evaluation today patient appears to be doing poorly with regard to his left lower extremity. We did get the results of his culture back it shows that he is still showing evidence of Pseudomonas which is consistent with the purulent/blue-green drainage that he has currently. Subsequently the culture also shows that he now is showing resistance to the oral fluoroquinolones which is unfortunate as that was really the only thing to treat the infection prior. I do believe that he is looking like this is going require IV antibiotic therapy to get this under control. Fortunately there is no signs of systemic infection at this time which is good news. The patient does see Dr. Megan Salon tomorrow. 08/09/2019 upon evaluation today patient appears to be doing better with regard to his left lower extremity ulcer in regard to the  overall appearance. He is currently on IV antibiotic therapy. As ordered by Dr. Megan Salon. Currently the patient is on ceftazidime which she is going to take for the next 2 weeks and then follow-up for 4 to 5-week appointment with Dr. Megan Salon. The patient started this this past Friday symptoms have not for a total of 3 days currently in full. 08/16/2019 upon evaluation today patient's wound actually does show muscle in the base of the wound but in general does appear to be much better as far as the overall evidence of infection is concerned. In fact I feel like this is for the most part cleared up he still on the IV antibiotics he has not completed the full course yet but I think he is doing much better which is excellent news. 08/23/2019 upon evaluation today patient appears to be doing about the same with regard to his wound at this point. He tells me that he still has pain unfortunately. Fortunately there is no evidence of systemic infection at this time which is great news. There is significant muscle protrusion. 09/13/19 upon evaluation today patient appears to be doing about the same in regard to his leg unfortunately. He still has a lot of drainage coming from the ulceration there is still muscle exposed. With that being said the patient's last wound culture still showed an intermediate finding with regard to the Pseudomonas he still having the bluish/green drainage as well. Overall I do not know that the wound has completely cleared of infection at this point. Fortunately there is no signs of active infection systemically at this  point which is good news. 09/20/2019 upon evaluation today patient's wound actually appears to be doing about the same based on what I am seeing currently. I do not see any signs of systemic infection he still does have evidence of some local infection and drainage. He did see Dr. Megan Salon last week and Dr. Megan Salon states that he probably does need a different IV  antibiotic although he does not want to put him on this until the patient begins the Remicade infusion which is actually scheduled for about 10 days out from today on 13 May. Following that time Dr. Megan Salon is good to see him back and then will evaluate the feasibility of starting him on the IV antibiotic therapy once again at that point. I do not disagree with this plan I do believe as Dr. Megan Salon stated in his note that I reviewed today that the patient's issue is multifactorial with the pyoderma being 1 aspect of this that were hoping the Remicade will be helpful for her. In the meantime I think the gentamicin is, helping to keep things under decent okay control in regard to the ulcer. 09/27/2019 upon evaluation today patient appears to be doing about the same with regard to his wound still there is a lot of muscle exposure though he does have some hyper granulation tissue noted around the edge and actually some granulation tissue starting to form over the muscle which is actually good news. Fortunately there is no evidence of active infection which is also good news. His pain is less at this point. 5/21; this is a patient I have not seen in a long time. 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 He has a large wound on the left lateral leg with protruding muscle. He comes in the clinic today showing the same area on his left medial ankle. He says there is been a spot there for some time although we have not previously defined this. Today he has a clearly defined area with slight amount of skin breakdown surrounded by raised areas with a purplish hue in color. This is not painful he says it is irritated. 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 He still has muscle protruding from the lateral portion of his left leg but fortunately the new area that  they were concerned about at his last visit does not appear to have opened at this point. He is currently on Remicade infusions and seems to be doing better in my opinion in fact the wound itself seems to be overall much better. The purplish discoloration that he did have seems to have resolved and I think that is a good sign that hopefully the Remicade is doing its job. He does Brier, Tabb ([id number]) have some biofilm noted over the surface of the wound. 11/01/2019 on evaluation today patient's wound actually appears to be doing excellent at this time. Fortunately there is no evidence of active infection and overall I feel like he is making great progress. The Remicade seems to be due excellent job in my opinion. 11/08/19 evaluation today vision actually appears to be doing quite well with regard to his weight ulcer. He's been tolerating dressing changes without complication. Fortunately there is no evidence of infection. No fevers, chills, nausea, or vomiting noted at this time. Overall states that is having more itching than pain which is actually a good sign in my opinion. 12/13/2019  upon evaluation today patient appears to be doing well today with regard to his wound. He has been tolerating the dressing changes without complication. Fortunately there is no sign of active infection at this time. No fevers, chills, nausea, vomiting, or diarrhea. Overall I feel like the infusion therapy has been very beneficial for him. 01/06/2020 on evaluation today patient appears to be doing well with regard to his wound. This is measuring smaller and actually looks to be doing better. Fortunately there is no signs of active infection at this point. No fevers, chills, nausea, vomiting, or diarrhea. With that being said he does still have the blue-green drainage but this does not seem to be causing any significant issues currently. He has been using the gentamicin that does seem to be keeping things under decent  control at this point. He goes later this morning for his next infusion therapy for the pyoderma which seems to also be very beneficial. 02/07/2020 on evaluation today patient appears to be doing about the same in regard to his wounds currently. Fortunately there is no signs of active infection systemically he does still have evidence of local infection still using gentamicin. He also is showing some signs of improvement albeit slowly I do feel like we are making some progress here. 02/21/2020 upon evaluation today patient appears to be making some signs of improvement the wound is measuring a little bit smaller which is great news and overall I am very pleased with where he stands currently. He is going to be having infusion therapy treatment on the 15th of this month. Fortunately there is no signs of active infection at this time. 03/13/2020 I do believe patient's wound is actually showing some signs of improvement here which is great news. He has continue with the infusion therapy through rheumatology/dermatology at [facility]. That does seem to be beneficial. I still think he gets as much benefit from this as he did from the prednisone initially but nonetheless obviously this is less harsh on his body that the prednisone as far as they are concerned. 03/31/2020 on evaluation today patient's wound actually showing signs of some pretty good improvement in regard to the overall appearance of the wound bed. There is still muscle exposed though he does have some epithelial growth around the edges of the wound. Fortunately there is no signs of active infection at this time. No fevers, chills, nausea, vomiting, or diarrhea. 04/24/2020 upon evaluation today patient appears to be doing about the same in regard to his leg ulcer. He has been tolerating the dressing changes without complication. Fortunately there is no signs of active infection at this time. No fevers, chills, nausea, vomiting, or diarrhea. With that  being said he still has a lot of irritation from the bandaging around the edges of the wound. We did discuss today the possibility of a referral to plastic surgery. 05/22/2020 on evaluation today patient appears to be doing well with regard to his wounds all things considered. He has not been able to get the Chantix apparently there is a recall nurse that I was unaware of put out by [employer] involuntarily. Nonetheless for now I am and I have to do some research into what may be the best option for him to help with quitting in regard to smoking and we discussed that today. 06/26/2020 upon evaluation today patient appears to be doing well with regard to his wound from the standpoint of infection I do not see any signs of infection at this point. With  that being said unfortunately he is still continuing to have issues with muscle exposure and again he is not having a whole lot of new skin growth unfortunately. There does not appear to be any signs of active infection at this time. No fevers, chills, nausea, vomiting, or diarrhea. 07/10/2020 upon evaluation today patient appears to be doing a little bit more poorly currently compared to where he was previous. I am concerned currently about an active infection that may be getting worse especially in light of the increased size and tenderness of the wound bed. No fevers, chills, nausea, vomiting, or diarrhea. 07/24/2020 upon evaluation today patient appears to be doing poorly in regard to his leg ulcer. He has been tolerating the dressing changes without complication but unfortunately is having a lot of discomfort. 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 Subsequently I think he is going require possibly IV antibiotics to get this under control. I am very concerned about the severity of his infection and the amount of discomfort he is having. 07/31/2020 upon evaluation today patient appears to  be doing about the same in regard to his leg wound. He did see Dr. Megan Salon and Dr. Megan Salon is actually going to start him on IV antibiotics. He goes for the PICC line tomorrow. With that being said there do not have that run for 2 weeks and then see how things are doing and depending on how he is progressing they may extend that a little longer. Nonetheless I am glad this is getting ready to be in place and definitely feel it may help the patient. In the meantime is been using mainly triamcinolone to the wound bed has an anti-inflammatory. 08/07/2020 on evaluation today patient appears to be doing well with regard to his wound compared even last week. In the interim he has gotten the PICC line placed and overall this seems to be doing excellent. There does not appear to be any evidence of infection which is great news systemically although locally of course has had the infection this appears to be improving with the use of the antibiotics. 08/14/2020 upon evaluation today patient's wound actually showing signs of excellent improvement. Overall the irritation has significantly improved the drainage is back down to more of a normal level and his pain is really pretty much nonexistent compared to what it was. Obviously I think that this is significantly improved secondary to the IV antibiotic therapy which has made all the difference in the world. Again he had a resistant form of Pseudomonas for which oral antibiotics just was not cutting it. Nonetheless I do think that still we need to consider the possibility of a surgical closure for this wound is been open so long and to be honest with muscle exposed I think this can be very hard to get this to close outside of this although definitely were still working to try to do what we can in that regard. 08/21/2020 upon evaluation today patient appears to be doing very well with regard to his wounds on the left lateral lower extremity/calf area. Fortunately  there does not appear to be signs of active infection which is great news and overall very pleased with where things stand today. He is actually wrapping up his treatment with IV antibiotics tomorrow. After that we will see where things go from there. 08/28/2020 upon evaluation today patient appears to be doing decently well with regard to his leg ulcer. There does not appear  to be any signs of Lybrand, Terryl ([id number]) active infection which is great news and overall very pleased with where things stand today. No fevers, chills, nausea, vomiting, or diarrhea. 09/18/2020 upon evaluation today patient appears to be doing well with regard to his infection which I feel like is better. Unfortunately he is not doing as well with regard to the overall size of the wound which is not nearly as good at this point. I feel like that he may be having an issue here with the pyoderma being somewhat out of control. I think that he may benefit from potentially going back and talking to the dermatologist about what to do from the pyoderma standpoint. I am not certain if the infusions are helping nearly as much is what the prednisone did in the past. 10/02/2020 upon evaluation today patient appears to be doing well with regard to his leg ulcer. He did go to the [facility] nurse. Unfortunately they feel like there is a 10% chance that most that he would be able to heal and that the skin graft would take. Obviously this has led him to not be able to go down that path as far as treatment is concerned. Nonetheless he does seem to be doing a little bit better with the prednisone that I gave him last time. I think that he may need to discuss with dermatology the possibility of long-term prednisone as that seems to be what is most helpful for him to be perfectly honest. I am not sure the Remicade is really doing the job. 10/17/2020 upon evaluation today patient appears to be doing a little better in regard to his wound. In fact the  case has been since we did the prednisone on May 2 for him that we have noticed a little bit of improvement each time we have seen a size wise as well as appearance wise as well as pain wise. I think the prednisone has had a greater effect then the infusion therapy has to be perfectly honest. With that being said the patient also feels significantly better compared to what he was previous. All of this is good news but nonetheless I am still concerned about the fact that again we are really not set up to long-term manage him as far as prednisone is concerned. Obviously there are things that you need to be watched I completely understand the risk of prednisone usage as well. 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 With all that being said I do believe that we can give him another round of the prednisone which she is requesting today because of the improvement that he seen since we did that first round. 10/30/2020 upon evaluation today patient's wound actually is showing signs of doing quite well. There does not appear to be any evidence of infection which is great news and overall very pleased with where things stand today. No fevers, chills, nausea, vomiting, or diarrhea. He tells me that the prednisone still has seem to have helped he wonders if we can extend that for just a little bit longer. He did not have the appointment with a dermatologist although he did have an infusion appointment last Friday. That was at [facility]. With that being said he tells me he could not do both that as well as the appointment with the physician on the same day therefore that is can have to be rescheduled. I really want to see if there is  anything they feel like that could be done differently to try to help this out as I am not really certain that the infusions are helping significantly here. 11/13/2020 upon evaluation today patient unfortunately appears to be doing somewhat  poorly in regard to his wound I feel like this is actually worsening from the standpoint of the pyoderma spreading. I still feel like that he may need something different as far as trying to manage this going forward. Again we did the prednisone unfortunately his blood sugars are not doing so well because of this. Nonetheless I believe that the patient likely needs to try topical steroid. We have done triamcinolone for a while I think going with something stronger such as clobetasol could be beneficial again this is not something I do lightly I discussed this with the patient that again this does not normally put underneath an occlusive dressing. Nonetheless I think a thin film as such could help with some of the stronger anti-inflammatory effects. We discussed this today. He would like to try to give this a trial for the next couple weeks. I definitely think that is something that we can do. Evaluate7/03/2021 and today patient's wound bed actually showed signs of doing really about the same. There was a little expansion of the size of the wound and that leading edge that we done looking out although the clobetasol does seem to have slowed this down a bit in my opinion. There is just 1 small area that still seems to be progressing based on what I see. Nonetheless I am concerned about the fact this does not seem to be improving if anything seems to be doing a little bit worse. I do not know that the infusions are really helping him much as next infusion is August 5 his appointment with dermatology is July 25. Either way I really think that we need to have a conversation potentially about this and I am actually going to see if I can talk with Dr. Lillia Carmel in order to see where things stand as well. 12/11/2020 upon evaluation today patient appears to be doing worse in regard to his leg ulcer. Unfortunately I just do not think this is making the progress that I would like to see at this point. Honestly he  does have an appointment with dermatology and this is in 2 days. I am wondering what they may have to offer to help with this. Right now what I am seeing is that he is continuing to show signs of worsening little by little. Obviously that is not great at all. Is the exact opposite of what we are looking for. 12/18/2020 upon evaluation today patient appears to be doing a little better in regard to his wound. The dermatologist actually did do some steroid injections into the wound which does seem to have been beneficial in my opinion. That was on the 25th already this looks a little better to me than last time I saw him. With that being said we did do a culture and this did show that he has Staph aureus noted in abundance in the wound. With that being said I do think that getting him on an oral antibiotic would be appropriate as well. Also think we can compression wrap and this will make a difference as well. 12/28/2020 upon evaluation today patient's wound is actually showing signs of doing much better. I do believe the compression wrap is helping he has a lot of drainage but to be honest I think  that the compression is helping to some degree in this regard as well as not draining through which is also good news. No fevers, chills, nausea, vomiting, or diarrhea. 01/04/2021 upon evaluation today patient appears to be doing well with regard to his wound. Overall things seem to be doing quite well. He did have a little bit of reaction to the CarboFlex Sorbact he will be using that any longer. With that being said he is controlled as far as the drainage is concerned overall and seems to be doing quite well. I do not see any signs of active infection at this time which is great news. No fevers, chills, nausea, vomiting, or diarrhea. 01/11/2021 upon evaluation today patient appears to be doing well with regard to his wounds. He has been tolerating the dressing changes without complication. Fortunately there does  not appear to be any signs of active infection at this time which is great news. Overall I am extremely pleased with where we stand currently. No fevers, chills, nausea, vomiting, or diarrhea. Where using clobetasol in the wound bed he has a lot of new skin growth which is awesome as well. 01/18/2021 upon evaluation today patient appears to be doing very well in regard to his leg ulcer. He has been tolerating the dressing changes without complication. Fortunately there does not appear to be any signs of active infection which is great news. In general I think that he is making excellent progress 01/25/2021 upon evaluation today patient appears to be doing well with regard to his wound on the leg. I am actually extremely pleased with where things stand today. There does not appear to be any signs of active infection which is great news and overall I think that we are definitely headed in the appropriate direction based on what I am seeing currently. There does not appear to be any signs of active infection also excellent news. 02/06/2021 upon evaluation today patient appears to be doing well with regard to his wound. Overall visually this is showing signs of significant Berlanga, Montray ([id number]) improvement which is great news. I do not see any signs of active infection systemically which is great even locally I do not think that we are seeing any major complications here. We did do fluorescence imaging with the [employer] today. The patient does have some odor and drainage noted and again this is something that I think would benefit him to probably come more frequently for nurse visits. 02/19/2021 upon evaluation today patient actually appears to be doing quite well in regard to his wound. He has been tolerating the dressing changes without complication and overall I think that this is making excellent progress. I do not see any evidence of active infection at this point which is great news as well. No  fevers, chills, nausea, vomiting, or diarrhea. 10/10; wound is made nice progress healthy granulation with a nice rim of epithelialization which seems to be expanding even from last week he has a deeper area in the inferior part of the more distal part of the wound with not quite as healthy as surface. This area will need to be followed. Using clobetasol and Hydrofera Blue 03/05/2021 upon evaluation today patient appears to be doing very well in regard to his leg ulcer. He has been tolerating dressing changes without complication. Fortunately there does not appear to be any signs of active infection which is great news and overall I am extremely pleased with where we stand currently. 03/12/2021 upon evaluation today patient  appears to be doing well with regard to his wound in fact this is extremely extremely good based on what we are seeing today there does not appear to be any signs of active infection and overall I think that he is doing awesome from the standpoint of healing in general. I am extremely pleased with how things seem to be progressing with regard to this pyoderma. Clobetasol has done wonders for him. I think the compression wrapping has also been of great benefit. 03/19/2021 upon evaluation today patient appears to be doing well with regard to his wound. He is tolerating the dressing changes without complication. In fact I feel like that he is actually making excellent progress at this point based on what I am seeing. No fevers, chills, nausea, vomiting, or diarrhea. 03/26/2021 upon evaluation today patient appears to be doing well with regard to his wound. This again is measuring smaller and looking better. Again the progress is slow but nonetheless continual with what we have been seeing. I do believe that the current plan is doing awesome for him. 04/09/2021 upon evaluation today patient appears to be doing well with regard to his leg ulcer. This is showing signs of excellent  improvement the muscle is completely closed over and there does not appear to be any evidence of inflammation at this point his drainage is significantly improved. Overall I think that he would be a good candidate for looking into a skin substitute at this point as well. We will get a look into some approvals in that regard. Potentially TheraSkin as well as Apligraf could both be considered just depending on insurance coverage. 04/16/2021 upon evaluation today patient appears to be doing well at this point. He has been approved for the Apligraf which we could definitely order although I would like to try to get the TheraSkin approved if at all possible. I did fax notes into them today and I Georgina Peer try to give a call as well. Overall the wound appears to be doing decently well today. 04/20/2021 upon evaluation today patient actually appears to be doing quite well in regard to his wound with that being said we are trying to see what we can do about speeding up the healing process. For that reason we did discuss the possibility of a skin substitute. We got the Apligraf approved. For that reason we will get a go ahead and see what we can do with the Apligraf at this point. I am still trying to get the TheraSkin approved but I have not heard anything from the insurance company yet I have called and talked to them earlier in the week and to be honest this was about half an hour that I spent on the phone they told me I would hear something within 10-15 business days. 04/27/2021 upon evaluation today patient appears to be doing excellent in regard to his wound. I do believe the Apligraf has been beneficial. With that being said he has had a little bit of increased pain has been a little bit concerned. I do want to go ahead and apply his steroid cream today the clobetasol and then put the Apligraf over I just do not think I want to risk not putting the clobetasol on based on what is been going on here currently.  Patient voiced understanding. 05/04/2021 upon evaluation today patient is making excellent improvement. Overall there is definitely decrease in the size of the wound today and I am very pleased in that regard. I do not  see any signs of active infection locally nor systemically at this point. No fevers, chills, nausea, vomiting, or diarrhea. 05/11/2021 upon evaluation today patient appears to be doing well with regard to his wound. He has been tolerating Apligraf's without complication and is making excellent progress this is application #4 today. 12/30; patient here for Apligraf application. Appears to be doing well small wound there has been a lot of healing 05/28/2021 upon evaluation today patient appears to be doing well with regard to his wound. Has been tolerating the dressing changes without complication. Fortunately I do not see any signs of active infection locally nor systemically at this point which is great news. He is done with the Apligraf and the first round and overall this has filled in quite nicely I am not even certain he needs any additional at the moment. I would actually try to go back to clobetasol with [facility] which previously was doing well for him. 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 He is using clobetasol and Hydrofera Blue. Surface area of the wound is smaller and the surface looks very healthy. 06/11/2021 upon evaluation today patient actually appears to be showing signs of good improvement which is great news. Fortunately I do not see any signs of active infection at this time which is also excellent news. I do believe that the patient is doing well with the clobetasol and Hydrofera Blue. He does have some irritation and itching around the rest of the leg will use some triamcinolone over this area. 06/18/2021 upon evaluation today patient appears to be doing well with regard to his wound. He is showing signs of  excellent improvement and overall I am extremely pleased with where we stand today. We are moving in the right direction. 06/25/2021 upon evaluation today patient appears to be doing well with regard to his wound. In fact this is doing also not showing signs of excellent improvement overall very pleased with where we stand today. I do not see any evidence of active infection locally or systemically at this time which is also great news. 07/02/2021 upon evaluation patient's wound bed actually showed signs of good granulation and epithelization at this point. And this is measuring significantly smaller and overall I am extremely pleased with where we stand today. No fevers, chills, nausea, vomiting, or diarrhea. 07/09/2021 upon evaluation patient's wound bed actually showed signs of good granulation and epithelization at this point. Fortunately there does not appear to be any evidence of active infection locally or systemically which is great news and overall I am extremely pleased with where we SAKETH, DAUBERT ([id number]) stand at this point. 07/16/2021 upon evaluation today patient appears to be doing well with regard to his wound all things considered although it is maybe a little bit larger than what its been. This does have me a little concerned. Actually question about whether anything is changed and initially he told me know. In fact it was not until the end of the visit that he actually did mention that he had missed his infusion in January. This very well may be what is because the difference is also seeing currently. That definitely has seem to been helping more recently. 07/23/2021 upon evaluation today patient's wound is yet again larger despite the switch to a collagen which was not beneficial. Subsequently I am going to at this point check on a couple things. First and foremost I do want to go ahead and do a culture.  I think that there is a chance he could have a low-lying infection that may be  part of the issue here. Subsequently I am also probably can go ahead and place him on Bactrim DS which I think is a good option and has done well with them in the past. Again if this is an infection that should hopefully help to turn things around. With all that being said I do believe that the patient should hopefully be able to turn this around with reinitiation of the infusion therapy which will be going on this week and subsequently getting on the antibiotic. 3/13; patient presents for follow-up. He has no issues or complaints today. He states he is currently on oral antibiotics for previous culture done in the office. 08/06/2021 upon evaluation today patient appears to be doing well with regard to his wound. I am actually seeing signs of improvement which is great news. I think that he is on a better track he did receive his infusion as well which is great news and overall I think that this should hopefully get him back on track as far as healing is concerned. He is not having any pain and I do not see any signs of inflammation which is great news. 08/13/2021 upon evaluation today patient appears to be doing excellent in regard to his wound on the leg. He has been tolerating the dressing changes without complication. Fortunately I do not see any signs of infection I do believe he is making good progress here and I think her back on track overall the tissue is greatly improved compared to what we have been. 08/20/2021 upon evaluation today patient appears to be doing excellent in regard to his wound. He has been tolerating the dressing changes without complication. Fortunately I do not see any evidence of infection at this time locally or systemically which is great news and overall very pleased with where we stand. 08-31-2021 upon evaluation today patient appears to be doing excellent in regard to his wound. Overall even with the vacation and extra walking he seems to have really done well.  Fortunately I do not see any evidence of active infection locally or systemically which is great news and overall I think we are headed in the right direction. 09-10-2021 upon evaluation today patient's wound is showing signs of improvement. This is a small improvement but nonetheless we seem to be making progress here. I am very pleased in that regard. There does not appear to be any signs of infection and overall things are doing quite nicely. Upon inspection patient's wound bed actually showed signs of good granulation and epithelization at this point. There was some irritation around the edges of the wound where the good skin was it almost appears that something was rubbing or else is was due from drainage and irritation. Nonetheless I am going to see about putting a little bit of zinc just around the wound opening in order to protect the skin from being damaged. The patient is in agreement with that plan. 5/8; its been a while since I have seen this wound and it looks considerably better although still with some depth. Per our intake nurse the dimensions have improved patient has been using a thin layer of clobetasol, Hydrofera Blue under 4-layer compression. He appears to be making progress 10-01-2021 upon evaluation today patient appears to be doing well with regard to his wound although it is somewhat stalled I feel like were basically at a standstill here. He has  previously had Apligraf which did very well for him to be honest. I do believe that he may benefit from a repeat of the Apligraf. In fact it got so small at that point but I do not think we even used all of the applications that were recommended as this had improved to such a degree. Nonetheless I do believe that the patient currently could benefit from repeat treatment with the Apligraf due to the improvement this that we did see. He is not opposed to this and subsequently working to look into getting approval to reinstitute  that treatment. 10-08-2021 upon evaluation today patient appears to be doing well with regard to his wound. Overall I feel like he is doing a little bit better from the standpoint of irritation at this time which is great news. I do not see any signs of active infection locally or systemically which is great news as well. No fevers, chills, nausea, vomiting, or diarrhea. 10-12-2021 upon evaluation today patient appears to be doing well with regard to his wound stable although we are can initiate treatment with Apligraf today. I am hoping this will stimulate things to improve. Patient is in agreement with that plan. 10-19-2028 upon evaluation today patient appears to be doing well with regard to his wound. He has been tolerating the dressing changes without complication. Fortunately there does not appear to be any evidence of active infection locally or systemically at this time which is great news. No fevers, chills, nausea, vomiting, or diarrhea. I do believe the Apligraf has improved the overall appearance of the wound bed receiving some definite improvements here. 10-26-2021 upon evaluation today patient's wound is actually shown some signs of really good improvement I am very pleased in this regard. There is a little bit of slough and biofilm buildup that is stuck to the surface of the wound I am getting very lightly debrided in order to clear this away and then subsequently will get a continue with the Apligraf application today. This is #3. 11-09-2021 upon evaluation today patient appears to be doing well currently in regard to his wound. This is actually showing signs of significant improvement which is great news and overall I think the Apligraf is doing an amazing job. This wound is significantly smaller compared to the last time I saw him. This will be Apligraf application #4. 12-20-5051 upon evaluation today patient appears to be doing well currently in regard to his wound he is actually showing  signs of excellent improvement which is great news and overall I am extremely pleased with where we stand today. I do not see any evidence of active infection locally or systemically at this time which is great news. No fevers, chills, nausea, vomiting, or diarrhea. I believe the Apligraf is doing an awesome job and in fact I [place] see about getting an extension of application which hopefully should not be a problem. Especially in light of how much improvement he has had with regards to the size of the wound with treatment. This is application #5 today. FRANCES, JOYNT ([id number]) 12-07-2021 upon evaluation today patient appears to be doing excellent in regard to his wound has been tolerating the dressing changes without complication. Fortunately there does not appear to be any signs of active infection locally or systemically at this time. Objective Constitutional Obese and well-hydrated in no acute distress. Vitals Time Taken: 8:08 AM, Height: 71 in, Weight: 338 lbs, BMI: 47.1, Temperature: 98.2 F, Pulse: 78 bpm, Respiratory Rate: 16 breaths/min, Blood Pressure:  140/85 mmHg. Respiratory normal breathing without difficulty. Psychiatric this patient is able to make decisions and demonstrates good insight into disease process. Alert and Oriented x 3. pleasant and cooperative. General Notes: Patient's wound bed showed evidence of good granulation and epithelization currently no debridement was necessary he actually is making excellent progress. We are still waiting to hear back on the remainder of the approval for second round of Apligraf since he is done so well with the first round. Integumentary (Hair, Skin) Wound #1 status is Open. Original cause of wound was Gradually Appeared. The date acquired was: 11/18/2015. The wound has been in treatment 261 weeks. The wound is located on the Left,Lateral Lower Leg. The wound measures 2.5cm length x 2cm width x 0.1cm depth; 3.927cm^2 area and 0.393cm^3  volume. There is Fat Layer (Subcutaneous Tissue) exposed. There is a medium amount of serosanguineous drainage noted. There is small (1-33%) pink granulation within the wound bed. There is a small (1-33%) amount of necrotic tissue within the wound bed including Adherent Slough. 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 There was a Four Layer Compression Therapy Procedure with a pre-treatment ABI of 1.2 by [name], RN. Post procedure Diagnosis Wound #1: Same as Pre-Procedure Plan Follow-up Appointments: Return Appointment in 1 week. Nurse Visit as needed - twice a week ALDWIN, MICALIZZI ([id number]) Bathing/ Shower/ Hygiene: Clean wound with Normal Saline or wound cleanser. May shower with wound dressing protected with water repellent cover or cast protector. No tub bath. Cellular or Tissue Based Products: Wound #1 Left,Lateral Lower Leg: Cellular or Tissue Based Product applied to wound bed; including contact layer, fixation with steri-strips, dry gauze and cover dressing. (DO NOT REMOVE). - no apligraf applied today, waiting on approval Edema Control - Lymphedema / Segmental Compressive Device / Other: Optional: One layer of unna paste to top of compression wrap (to act as an anchor). - He needs this to hold it up Elevate, Exercise Daily and Avoid Standing for Long Periods of Time. Elevate legs to the level of the heart and pump ankles as often as possible Elevate leg(s) parallel to the floor when sitting. WOUND #1: - Lower Leg Wound Laterality: Left, Lateral Cleanser: Wound Cleanser 3 x Per Week/30 Days Discharge Instructions: Wash your  hands with soap and water. Remove old dressing, discard into plastic bag and place into trash. Cleanse the wound with Wound Cleanser prior to applying a clean dressing using gauze sponges, not tissues or cotton balls. Do not scrub or use excessive force. Pat dry using gauze sponges, not tissue or cotton balls. Peri-Wound Care: Desitin Maximum Strength Ointment 4 (oz) 3 x Per Week/30 Days Discharge Instructions: Apply around wound edges to avoid maceration Peri-Wound Care: Triamcinolone Acetonide Cream, 0.1%, 15 (g) tube 3 x Per Week/30 Days Discharge Instructions: Apply to leg Topical: Clobetasol Propionate ointment 0.05%, 60 (g) tube 3 x Per Week/30 Days Discharge Instructions: Pt to bring to appt- applied to wound bed Secondary Dressing: (NON-BORDER) Zetuvit Plus Silicone NON-BORDER 5x5 (in/in) 3 x Per Week/30 Days Compression Wrap: Medichoice 4 layer Compression System, 35-40 mmHG 3 x Per Week/30 Days Discharge Instructions: Apply multi-layer wrap as directed. 1. Would recommend currently that we going continue with the compression currently which seems  to be doing quite well. I am get a go ahead and recommend that we continue with the clobetasol and since we are on the Apligraf right now Calvin put the endoform on this instead. We are still going to treat this like the Apligraf changing it once a week however and keeping everything the same otherwise. 2. I am also can recommend that we continue with the 4-layer compression wrap after applying the Zetuvit we are using the gauze bolster that seems to do better for him. We will see patient back for reevaluation in 1 week here in the clinic. If anything worsens or changes patient will contact our office for additional recommendations. Electronic Signature(s) Signed: 12/07/2021 9:16:45 AM By: Worthy Keeler PA-C Entered By: Worthy Keeler on 12/07/2021 09:16:44 QUINTRELL, BAZE  ([id number]) -------------------------------------------------------------------------------- SuperBill Details Patient Name: [name] Date of Service: 12/07/2021 Medical Record Number: [id number] Patient Account Number: [id number] Date of Birth/Sex: 12-11-1980 (40 y.o. M) Treating RN: [name] Primary Care Provider: [name] Other Clinician: Massie Kluver Referring Provider: [name] Treating Provider/Extender: [name] Cliche in Treatment: 261 Diagnosis Coding ICD-10 Codes Code Description I87.2 Venous insufficiency (chronic) (peripheral) L97.222 Non-pressure chronic ulcer of left calf with fat layer exposed E11.622 Type 2 diabetes mellitus with other skin ulcer L88 Pyoderma gangrenosum F17.208 Nicotine dependence, unspecified, with other nicotine-induced disorders Facility Procedures CPT4 Code: 38329191 Description: (Facility Use Only) 301 112 0264 - [facility] LWR LT LEG Modifier: Quantity: 1 Physician Procedures CPT4 Code: 5997741 Description: 42395 - WC PHYS LEVEL 3 - EST PT Modifier: Quantity: 1 CPT4 Code: Description: ICD-10 Diagnosis Description I87.2 Venous insufficiency (chronic) (peripheral) L97.222 Non-pressure chronic ulcer of left calf with fat layer exposed E11.622 Type 2 diabetes mellitus with other skin ulcer L88 Pyoderma gangrenosum Modifier: Quantity: Electronic Signature(s) Unsigned Previous Signature: 12/07/2021 9:16:56 AM Version By: Worthy Keeler PA-C Entered By: Massie Kluver on 12/07/2021 16:49:08 Signature(s): Date(s):

## 2021-12-10 DIAGNOSIS — I872 Venous insufficiency (chronic) (peripheral): Secondary | ICD-10-CM | POA: Diagnosis not present

## 2021-12-10 NOTE — Progress Notes (Signed)
[name], [name] ([id number]) Visit Report for 12/10/2021 Arrival Information Details Patient Name: [name], [name] Date of Service: 12/10/2021 8:00 AM Medical Record Number: [id number] Patient Account Number: [id number] Date of Birth/Sex: [birth year] (41 y.o. M) Treating RN: [name] Primary Care [name]: [name] Other Clinician: Referring [name]: [name] Treating [name]/Extender: [name] in Treatment: 49 Visit Information History Since Last Visit Has Dressing in Place as Prescribed: Yes Patient Arrived: Ambulatory Has Compression in Place as Prescribed: Yes Arrival Time: 08:06 Pain Present Now: No Accompanied By: self Transfer Assistance: None Patient Identification Verified: Yes Secondary Verification Process Completed: Yes Patient Requires Transmission-Based No Precautions: Patient Has Alerts: Yes Patient Alerts: Patient has reaction to silver dressings. Electronic Signature(s) Signed: 12/10/2021 5:31:53 PM By: [name], [name], RN, [name], [name], [name] Entered By: [name], [name], RN, [name], [name] on 12/10/2021 08:07:24 [name], [name] ([id number]) -------------------------------------------------------------------------------- Compression Therapy Details Patient Name: [name] Date of Service: 12/10/2021 8:00 AM Medical Record Number: [id number] Patient Account Number: [id number] Date of Birth/Sex: Dec 23, 1980 (41 y.o. M) Treating RN: [name] Primary Care Nickie Deren: [name] Other Clinician: Referring [name]: [name] Treating Errika Narvaiz/Extender: [name] in Treatment: 261 Compression Therapy Performed for Wound Assessment: Wound #1 Left,Lateral Lower Leg Performed By: Clinician [name] Barman, RN Compression Type: Four Layer Pre Treatment ABI: 1.2 Electronic Signature(s) Signed: 12/10/2021 5:31:53 PM By: [name], [name], RN, [name], [name], [name] Entered By: [name], [name], RN, [name], [name] on 12/10/2021 08:37:56 [name]  ([id number]) -------------------------------------------------------------------------------- Encounter Discharge Information Details Patient Name: [name] Date of Service: 12/10/2021 8:00 AM Medical Record Number: [id number] Patient Account Number: [id number] Date of Birth/Sex: 10-20-1980 (41 y.o. M) Treating RN: [name] Primary Care [name]: [name] Other Clinician: Referring [name]: [name] Treating [name]/Extender: [name] in Treatment: 33 Encounter Discharge Information Items Discharge Condition: Stable Ambulatory Status: Ambulatory Discharge Destination: Home Transportation: Private Auto Accompanied By: self Schedule Follow-up Appointment: Yes Clinical Summary of Care: Electronic Signature(s) Signed: 12/10/2021 5:31:53 PM By: [name], [name], RN, [name], [name], [name] Entered By: [name], [name], RN, [name], [name] on 12/10/2021 08:39:18 [name] ([id number]) -------------------------------------------------------------------------------- Wound Assessment Details Patient Name: [name] Date of Service: 12/10/2021 8:00 AM Medical Record Number: [id number] Patient Account Number: [id number] Date of Birth/Sex: 09/16/1980 (41 y.o. M) Treating RN: [name] Primary Care [name]: [name] Other Clinician: Referring [name]: [name] Treating [name]/Extender: [name] in Treatment: 261 Wound Status Wound Number: 1 Primary Etiology: Pyoderma Wound Location: Left, Lateral Lower Leg Wound Status: Open Wounding Event: Gradually Appeared Comorbid History: Sleep Apnea, Hypertension, Colitis Date Acquired: 11/18/2015 Weeks Of Treatment: 261 Clustered Wound: No Wound Measurements Length: (cm) 2.5 Width: (cm) 2 Depth: (cm) 0.1 Area: (cm) 3.927 Volume: (cm) 0.393 % Reduction in Area: 20% % Reduction in Volume: 90% Epithelialization: None Wound Description Classification: Full Thickness With Exposed Support  Structure Exudate Amount: Medium Exudate Type: Serosanguineous Exudate Color: red, brown s Slough/Fibrino Yes Wound Bed Granulation Amount: Small (1-33%) Exposed Structure Granulation Quality: Pink Fat Layer (Subcutaneous Tissue) Exposed: Yes Necrotic Amount: Small (1-33%) Necrotic Quality: Adherent Slough Assessment Notes Unable to assess, dressing to remain in place until Friday. Treatment Notes Wound #1 (Lower Leg) Wound Laterality: Left, Lateral Cleanser Wound Cleanser Discharge Instruction: Wash your hands with soap and water. Remove old dressing, discard into plastic bag and place into trash. Cleanse the wound with Wound Cleanser prior to applying a clean dressing using gauze sponges, not tissues or cotton balls. Do not scrub or use excessive  force. Pat dry using gauze sponges, not tissue or cotton balls. Peri-Wound Care Desitin Maximum Strength Ointment 4 (oz) Discharge Instruction: Apply around wound edges to avoid maceration Triamcinolone Acetonide Cream, 0.1%, 15 (g) tube Discharge Instruction: Apply to leg Topical Clobetasol Propionate ointment 0.05%, 60 (g) tube Discharge Instruction: Pt to bring to appt- applied to wound bed Primary Dressing Secondary Dressing (NON-BORDER) Zetuvit Plus Silicone NON-BORDER 5x5 (in/in) [name], [name] ([id number]) Secured With Compression Wrap Medichoice 4 layer Compression System, 35-40 mmHG Discharge Instruction: Apply multi-layer wrap as directed. Compression Stockings Environmental education officer) Signed: 12/10/2021 5:31:53 PM By: [name], [name], RN, [name], [name], [name] Entered By: [name], [name], RN, [name], [name] on 12/10/2021 08:37:21

## 2021-12-11 ENCOUNTER — Other Ambulatory Visit: Payer: Self-pay | Admitting: Primary Care

## 2021-12-11 DIAGNOSIS — E119 Type 2 diabetes mellitus without complications: Secondary | ICD-10-CM

## 2021-12-12 DIAGNOSIS — I872 Venous insufficiency (chronic) (peripheral): Secondary | ICD-10-CM | POA: Diagnosis not present

## 2021-12-12 NOTE — Progress Notes (Signed)
AREL, TIPPEN ([id number]) Visit Report for 12/12/2021 Arrival Information Details Patient Name: [name] Date of Service: 12/12/2021 8:00 AM Medical Record Number: [id number] Patient Account Number: [id number] Date of Birth/Sex: 1980-07-17 (41 y.o. M) Treating RN: [name] Primary Care Amahd Morino: [name] Other Clinician: Massie Kluver Referring Olen Eaves: [name] Treating Myrtice Lowdermilk/Extender: Yaakov Guthrie in Treatment: 28 Visit Information History Since Last Visit Has Dressing in Place as Prescribed: Yes Patient Arrived: Ambulatory Pain Present Now: No Arrival Time: 16:21 Transfer Assistance: None Patient Identification Verified: Yes Secondary Verification Process Completed: Yes Patient Requires Transmission-Based No Precautions: Patient Has Alerts: Yes Patient Alerts: Patient has reaction to silver dressings. Electronic Signature(s) Signed: 12/12/2021 4:21:45 PM By: [name], [name], RN, [name], [name], [name] Entered By: [name], [name], RN, [name], [name] on 12/12/2021 16:21:45 [name] ([id number]) -------------------------------------------------------------------------------- Encounter Discharge Information Details Patient Name: [name] Date of Service: 12/12/2021 8:00 AM Medical Record Number: [id number] Patient Account Number: [id number] Date of Birth/Sex: 1980/11/22 (40 y.o. M) Treating RN: [name] Primary Care [place]id Cassetta: [name] Other Clinician: Massie Kluver Referring Adam Demary: [name] Treating Mccall Will/Extender: Yaakov Guthrie in Treatment: 262 Encounter Discharge Information Items Discharge Condition: Stable Ambulatory Status: Ambulatory Discharge Destination: Home Transportation: Private Auto Accompanied By: self Schedule Follow-up Appointment: Yes Clinical Summary of Care: Electronic Signature(s) Signed: 12/12/2021 4:23:29 PM By: [name], [name], RN, [name], [name], [name] Entered By: [name], [name], RN, [name], [name] on 12/12/2021  16:23:29 [name] ([id number]) -------------------------------------------------------------------------------- Wound Assessment Details Patient Name: [name] Date of Service: 12/12/2021 8:00 AM Medical Record Number: [id number] Patient Account Number: [id number] Date of Birth/Sex: Aug 24, 1980 (40 y.o. M) Treating RN: [name] Primary Care Cher Egnor: [name] Other Clinician: Massie Kluver Referring Rainey Kahrs: [name] Treating Rayssa Atha/Extender: Yaakov Guthrie in Treatment: 262 Wound Status Wound Number: 1 Primary Etiology: Pyoderma Wound Location: Left, Lateral Lower Leg Wound Status: Open Wounding Event: Gradually Appeared Comorbid History: Sleep Apnea, Hypertension, Colitis Date Acquired: 11/18/2015 Weeks Of Treatment: 262 Clustered Wound: No Wound Measurements Length: (cm) 2.5 Width: (cm) 2 Depth: (cm) 0.1 Area: (cm) 3.927 Volume: (cm) 0.393 % Reduction in Area: 20% % Reduction in Volume: 90% Epithelialization: None Wound Description Classification: Full Thickness With Exposed Support Structures Exudate Amount: Medium Exudate Type: Serosanguineous Exudate Color: red, brown Slough/Fibrino Yes Wound Bed Granulation Amount: Small (1-33%) Exposed Structure Granulation Quality: Pink Fat Layer (Subcutaneous Tissue) Exposed: Yes Necrotic Amount: Small (1-33%) Necrotic Quality: Adherent Slough Treatment Notes Wound #1 (Lower Leg) Wound Laterality: Left, Lateral Cleanser Wound Cleanser Discharge Instruction: Wash your hands with soap and water. Remove old dressing, discard into plastic bag and place into trash. Cleanse the wound with Wound Cleanser prior to applying a clean dressing using gauze sponges, not tissues or cotton balls. Do not scrub or use excessive force. Pat dry using gauze sponges, not tissue or cotton balls. Peri-Wound Care Desitin Maximum Strength Ointment 4 (oz) Discharge Instruction: Apply around wound edges to avoid  maceration Triamcinolone Acetonide Cream, 0.1%, 15 (g) tube Discharge Instruction: Apply to leg Topical Clobetasol Propionate ointment 0.05%, 60 (g) tube Discharge Instruction: Pt to bring to appt- applied to wound bed Primary Dressing Secondary Dressing (NON-BORDER) Zetuvit Plus Silicone NON-BORDER 5x5 (in/in) Secured With Compression Wrap [name] ([id number]) Medichoice 4 layer Compression System, 35-40 mmHG Discharge Instruction: Apply multi-layer wrap as directed. Compression Stockings Environmental education officer) Signed: 12/12/2021 4:22:21 PM By: [name], [name], RN, [name], [name], [name] Entered By: [name], [name], RN, [name], [name] on 12/12/2021 33:54:56

## 2021-12-13 ENCOUNTER — Other Ambulatory Visit: Payer: Self-pay | Admitting: Primary Care

## 2021-12-13 DIAGNOSIS — E1165 Type 2 diabetes mellitus with hyperglycemia: Secondary | ICD-10-CM

## 2021-12-13 DIAGNOSIS — E785 Hyperlipidemia, unspecified: Secondary | ICD-10-CM

## 2021-12-13 MED ORDER — DEXCOM G6 TRANSMITTER MISC: 1 refills | Status: DC

## 2021-12-14 DIAGNOSIS — I872 Venous insufficiency (chronic) (peripheral): Secondary | ICD-10-CM | POA: Diagnosis not present

## 2021-12-14 NOTE — Progress Notes (Signed)
SLAYDEN, MENNENGA ([id number]) Visit Report for 12/14/2021 Arrival Information Details Patient Name: [name], [name] Date of Service: 12/14/2021 8:00 AM Medical Record Number: [id number] Patient Account Number: [id number] Date of Birth/Sex: 11/08/1980 (41 y.o. M) Treating RN: [name] Primary Care Miran Kautzman: [name] Other Clinician: Referring Nykerria Macconnell: [name] Treating Glori Machnik/Extender: [name] Cliche in Treatment: 47 Visit Information History Since Last Visit All ordered tests and consults were completed: No Patient Arrived: Ambulatory Added or deleted any medications: No Arrival Time: 08:08 Any new allergies or adverse reactions: No Transfer Assistance: None Had a fall or experienced change in No Patient Requires Transmission-Based No activities of daily living that may affect Precautions: risk of falls: Patient Has Alerts: Yes Hospitalized since last visit: No Patient Alerts: Patient has reaction Pain Present Now: No to silver dressings. Electronic Signature(s) Signed: 12/14/2021 10:57:03 AM By: Massie Kluver Entered By: Massie Kluver on 12/14/2021 09:12:07 [name] ([id number]) -------------------------------------------------------------------------------- Clinic Level of Care Assessment Details Patient Name: [name] Date of Service: 12/14/2021 8:00 AM Medical Record Number: [id number] Patient Account Number: [id number] Date of Birth/Sex: 1980-10-06 (40 y.o. M) Treating RN: [name] Primary Care Brentley Horrell: [name] Other Clinician: Referring Gregary Blackard: [name] Treating Joah Patlan/Extender: [name] Cliche in Treatment: 262 Clinic Level of Care Assessment Items TOOL 1 Quantity Score []  - Use when EandM and Procedure is performed on INITIAL visit 0 ASSESSMENTS - Nursing Assessment / Reassessment []  - General Physical Exam (combine w/ comprehensive assessment (listed just below) when performed on new 0 pt. evals) []  -  0 Comprehensive Assessment (HX, ROS, Risk Assessments, Wounds Hx, etc.) ASSESSMENTS - Wound and Skin Assessment / Reassessment []  - Dermatologic / Skin Assessment (not related to wound area) 0 ASSESSMENTS - Ostomy and/or Continence Assessment and Care []  - Incontinence Assessment and Management 0 []  - 0 Ostomy Care Assessment and Management (repouching, etc.) PROCESS - Coordination of Care []  - Simple Patient / Family Education for ongoing care 0 []  - 0 Complex (extensive) Patient / Family Education for ongoing care []  - 0 Staff obtains Programmer, systems, Records, Test Results / Process Orders []  - 0 Staff telephones HHA, Nursing Homes / Clarify orders / etc []  - 0 Routine Transfer to another Facility (non-emergent condition) []  - 0 Routine Hospital Admission (non-emergent condition) []  - 0 New Admissions / Biomedical engineer / Ordering NPWT, Apligraf, etc. []  - 0 Emergency Hospital Admission (emergent condition) PROCESS - Special Needs []  - Pediatric / Minor Patient Management 0 []  - 0 Isolation Patient Management []  - 0 Hearing / Language / Visual special needs []  - 0 Assessment of Community assistance (transportation, D/C planning, etc.) []  - 0 Additional assistance / Altered mentation []  - 0 Support Surface(s) Assessment (bed, cushion, seat, etc.) INTERVENTIONS - Miscellaneous []  - External ear exam 0 []  - 0 Patient Transfer (multiple staff / Civil Service fast streamer / Similar devices) []  - 0 Simple Staple / Suture removal (25 or less) []  - 0 Complex Staple / Suture removal (26 or more) []  - 0 Hypo/Hyperglycemic Management (do not check if billed separately) []  - 0 Ankle / Brachial Index (ABI) - do not check if billed separately Has the patient been seen at the hospital within the last three years: Yes Total Score: 0 Level Of Care: ____ [name] ([id number]) Electronic Signature(s) Signed: 12/14/2021 10:57:03 AM By: Massie Kluver Entered By: Massie Kluver on 12/14/2021  [place] ([id number]) -------------------------------------------------------------------------------- Compression Therapy Details Patient Name: [name] Date of Service: 12/14/2021 8:00 AM Medical Record Number: [id number] Patient  Account Number: [id number] Date of Birth/Sex: [birth year] (40 y.o. M) Treating RN: [name] Primary Care Blaize Nipper: [name] Other Clinician: Referring Datra Clary: [name] Treating Rayquan Amrhein/Extender: Jeri Cos Weeks in Treatment: 262 Compression Therapy Performed for Wound Assessment: Wound #1 Left,Lateral Lower Leg Performed By: Lenice Pressman, Angie, Compression Type: Four Layer Pre Treatment ABI: 1.2 Electronic Signature(s) Signed: 12/14/2021 10:57:03 AM By: Massie Kluver Entered By: Massie Kluver on 12/14/2021 09:13:43 Kinkaid, [name] ([id number]) -------------------------------------------------------------------------------- Encounter Discharge Information Details Patient Name: [name] Date of Service: 12/14/2021 8:00 AM Medical Record Number: [id number] Patient Account Number: [id number] Date of Birth/Sex: 1980-06-04 (40 y.o. M) Treating RN: [name] Primary Care [name] [name]: [name] Other Clinician: Referring Faren Florence: [name] Treating Edrick Whitehorn/Extender: [name] Cliche in Treatment: 262 Encounter Discharge Information Items Discharge Condition: Stable Ambulatory Status: Ambulatory Discharge Destination: Home Transportation: Private Auto Accompanied By: self Schedule Follow-up Appointment: Yes Clinical Summary of Care: Electronic Signature(s) Signed: 12/14/2021 10:57:03 AM By: Massie Kluver Entered By: Massie Kluver on 12/14/2021 09:12:59 Zaremba, [name] ([id number]) -------------------------------------------------------------------------------- Wound Assessment Details Patient Name: [name] Date of Service: 12/14/2021 8:00 AM Medical Record Number: [id number] Patient Account  Number: [id number] Date of Birth/Sex: [birth year] (40 y.o. M) Treating RN: [name] Primary Care Valeri Sula: [name] Other Clinician: Referring Sherelle Castelli: [name] Treating Frandy Basnett/Extender: [name] Cliche in Treatment: 262 Wound Status Wound Number: 1 Primary Etiology: Pyoderma Wound Location: Left, Lateral Lower Leg Wound Status: Open Wounding Event: Gradually Appeared Date Acquired: 11/18/2015 Weeks Of Treatment: 262 Clustered Wound: No Wound Measurements Length: (cm) 2.5 Width: (cm) 2 Depth: (cm) 0.1 Area: (cm) 3.927 Volume: (cm) 0.393 % Reduction in Area: 20% % Reduction in Volume: 90% Wound Description Classification: Full Thickness With Exposed Support Structures Exudate Amount: Medium Exudate Type: Serosanguineous Exudate Color: red, brown Treatment Notes Wound #1 (Lower Leg) Wound Laterality: Left, Lateral Cleanser Wound Cleanser Discharge Instruction: Wash your hands with soap and water. Remove old dressing, discard into plastic bag and place into trash. Cleanse the wound with Wound Cleanser prior to applying a clean dressing using gauze sponges, not tissues or cotton balls. Do not scrub or use excessive force. Pat dry using gauze sponges, not tissue or cotton balls. Peri-Wound Care Desitin Maximum Strength Ointment 4 (oz) Discharge Instruction: Apply around wound edges to avoid maceration Triamcinolone Acetonide Cream, 0.1%, 15 (g) tube Discharge Instruction: Apply to leg Topical Clobetasol Propionate ointment 0.05%, 60 (g) tube Discharge Instruction: Pt to bring to appt- applied to wound bed Primary Dressing Secondary Dressing (NON-BORDER) Zetuvit Plus Silicone NON-BORDER 5x5 (in/in) Secured With Compression Wrap Medichoice 4 layer Compression System, 35-40 mmHG Discharge Instruction: Apply multi-layer wrap as directed. Compression Stockings Add-Ons SION, REINDERS ([id number]) Electronic Signature(s) Signed: 12/14/2021 10:57:03 AM By:  Massie Kluver Signed: 12/14/2021 3:58:02 PM By: [name], [name], RN, [name], [name], [name] Entered By: Massie Kluver on 12/14/2021 08:11:53

## 2021-12-15 DIAGNOSIS — F3342 Major depressive disorder, recurrent, in full remission: Secondary | ICD-10-CM

## 2021-12-17 DIAGNOSIS — I872 Venous insufficiency (chronic) (peripheral): Secondary | ICD-10-CM | POA: Diagnosis not present

## 2021-12-17 NOTE — Progress Notes (Signed)
PERCIVAL, GLASHEEN ([id number]) Visit Report for 12/14/2021 Physician Orders Details Patient Name: [name], [name] Date of Service: 12/14/2021 8:00 AM Medical Record Number: [id number] Patient Account Number: [id number] Date of Birth/Sex: [birth year] (41 y.o. M) Treating RN: [name] Primary Care Provider: [name] Other Clinician: Referring Provider: [name] Treating Provider/Extender: [name] Cliche in Treatment: [phone] Verbal / Phone Orders: No Diagnosis Coding Follow-up Appointments o Return Appointment in 1 week. o Nurse Visit as needed - twice a week Bathing/ Shower/ Hygiene o Clean wound with Normal Saline or wound cleanser. o May shower with wound dressing protected with water repellent cover or cast protector. o No tub bath. Cellular or Tissue Based Products Wound #1 Left,Lateral Lower Leg o Cellular or Tissue Based Product applied to wound bed; including contact layer, fixation with steri-strips, dry gauze and cover dressing. (DO NOT REMOVE). - no apligraf applied today, waiting on approval Edema Control - Lymphedema / Segmental Compressive Device / Other o Optional: One layer of unna paste to top of compression wrap (to act as an anchor). - He needs this to hold it up o Elevate, Exercise Daily and Avoid Standing for Long Periods of Time. o Elevate legs to the level of the heart and pump ankles as often as possible o Elevate leg(s) parallel to the floor when sitting. Wound Treatment Wound #1 - Lower Leg Wound Laterality: Left, Lateral Cleanser: Wound Cleanser 3 x Per Week/30 Days Discharge Instructions: Wash your hands with soap and water. Remove old dressing, discard into plastic bag and place into trash. Cleanse the wound with Wound Cleanser prior to applying a clean dressing using gauze sponges, not tissues or cotton balls. Do not scrub or use excessive force. Pat dry using gauze sponges, not tissue or cotton balls. Peri-Wound Care: Desitin  Maximum Strength Ointment 4 (oz) 3 x Per Week/30 Days Discharge Instructions: Apply around wound edges to avoid maceration Peri-Wound Care: Triamcinolone Acetonide Cream, 0.1%, 15 (g) tube 3 x Per Week/30 Days Discharge Instructions: Apply to leg Topical: Clobetasol Propionate ointment 0.05%, 60 (g) tube 3 x Per Week/30 Days Discharge Instructions: Pt to bring to appt- applied to wound bed Primary Dressing: Promogran Matrix 4.34 (in) 3 x Per Week/30 Days Discharge Instructions: Moisten w/normal saline or sterile water; Cover wound as directed. Do not remove from wound bed. Secondary Dressing: (NON-BORDER) Zetuvit Plus Silicone NON-BORDER 5x5 (in/in) 3 x Per Week/30 Days Compression Wrap: Medichoice 4 layer Compression System, 35-40 mmHG 3 x Per Week/30 Days Discharge Instructions: Apply multi-layer wrap as directed. Electronic Signature(s) Signed: 12/14/2021 10:57:03 AM By: Massie Kluver Signed: 12/17/2021 5:40:09 PM By: Worthy Keeler PA-C Entered By: Massie Kluver on 12/14/2021 09:12:43 Fellman, BHAVIN ([id number]) ROZELL, THEILER ([id number]) -------------------------------------------------------------------------------- SuperBill Details Patient Name: [name] Date of Service: 12/14/2021 Medical Record Number: [id number] Patient Account Number: [id number] Date of Birth/Sex: 1981-02-24 (40 y.o. 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

## 2021-12-17 NOTE — Progress Notes (Signed)
[name], [name] ([id number]) Visit Report for 12/17/2021 Arrival Information Details Patient Name: [name], [name] Date of Service: 12/17/2021 8:00 AM Medical Record Number: [id number] Patient Account Number: [id number] Date of Birth/Sex: [birth year] (41 y.o. M) Treating RN: [name] Primary Care Keontay Vora: [name] Other Clinician: Referring Gracelin Weisberg: [name] Treating Julizza Sassone/Extender: [name] Cliche in Treatment: 35 Visit Information History Since Last Visit Added or deleted any medications: No Patient Arrived: Ambulatory Has Dressing in Place as Prescribed: Yes Arrival Time: 08:08 Has Compression in Place as Prescribed: Yes Accompanied By: self Pain Present Now: No Transfer Assistance: None Patient Identification Verified: Yes Secondary Verification Process Completed: Yes Patient Requires Transmission-Based No Precautions: Patient Has Alerts: Yes Patient Alerts: Patient has reaction to silver dressings. Electronic Signature(s) Signed: 12/17/2021 5:09:07 PM By: [name], [name], RN, [name], [name], [name] Entered By: [name], [name], RN, [name], [name] on 12/17/2021 08:08:55 [name] ([id number]) -------------------------------------------------------------------------------- Compression Therapy Details Patient Name: [name] Date of Service: 12/17/2021 8:00 AM Medical Record Number: [id number] Patient Account Number: [id number] Date of Birth/Sex: Sep 10, 1980 (40 y.o. M) Treating RN: [name] Primary Care Ramiz Turpin: [name] Other Clinician: Referring Shahida Schnackenberg: [name] Treating Tela Kotecki/Extender: [name] Cliche in Treatment: 262 Compression Therapy Performed for Wound Assessment: Wound #1 Left,Lateral Lower Leg Performed By: Clinician [name], RN Compression Type: Four Layer Pre Treatment ABI: 1.2 Electronic Signature(s) Signed: 12/17/2021 5:09:07 PM By: [name], [name], RN, [name], [name], [name] Entered By: [name], [name], RN, [name], [name] on 12/17/2021  08:40:39 [name] ([id number]) -------------------------------------------------------------------------------- Encounter Discharge Information Details Patient Name: [name] Date of Service: 12/17/2021 8:00 AM Medical Record Number: [id number] Patient Account Number: [id number] Date of Birth/Sex: [birth year] (40 y.o. M) Treating RN: [name] Primary Care Jairen Goldfarb: [name] Other Clinician: Referring Thora Scherman: [name] Treating Trequan Marsolek/Extender: [name] Cliche in Treatment: 262 Encounter Discharge Information Items Discharge Condition: Stable Ambulatory Status: Ambulatory Discharge Destination: Home Transportation: Private Auto Schedule Follow-up Appointment: Yes Clinical Summary of Care: Electronic Signature(s) Signed: 12/17/2021 5:09:07 PM By: [name], [name], RN, [name], [name], [name] Entered By: [name], [name], RN, [name], [name] on 12/17/2021 08:41:24 [name] ([id number]) -------------------------------------------------------------------------------- Wound Assessment Details Patient Name: [name] Date of Service: 12/17/2021 8:00 AM Medical Record Number: [id number] Patient Account Number: [id number] Date of Birth/Sex: August 08, 1980 (40 y.o. M) Treating RN: [name] Primary Care Gil Ingwersen: [name] Other Clinician: Referring Danyell Shader: [name] Treating Davinia Riccardi/Extender: [name] Cliche in Treatment: 262 Wound Status Wound Number: 1 Primary Etiology: Pyoderma Wound Location: Left, Lateral Lower Leg Wound Status: Open Wounding Event: Gradually Appeared Comorbid History: Sleep Apnea, Hypertension, Colitis Date Acquired: 11/18/2015 Weeks Of Treatment: 262 Clustered Wound: No Photos Wound Measurements Length: (cm) 2.5 Width: (cm) 2 Depth: (cm) 0.1 Area: (cm) 3.927 Volume: (cm) 0.393 % Reduction in Area: 20% % Reduction in Volume: 90% Wound Description Classification: Full Thickness With Exposed Support Structures Exudate Amount:  Medium Exudate Type: Serosanguineous Exudate Color: red, brown Foul Odor After Cleansing: No Slough/Fibrino No Wound Bed Granulation Amount: Large (67-100%) Exposed Structure Granulation Quality: Red Fascia Exposed: No Necrotic Amount: None Present (0%) Fat Layer (Subcutaneous Tissue) Exposed: No Tendon Exposed: No Muscle Exposed: No Joint Exposed: No Bone Exposed: No Treatment Notes Wound #1 (Lower Leg) Wound Laterality: Left, Lateral Cleanser Wound Cleanser Discharge Instruction: Wash your hands with soap and water. Remove old dressing, discard into plastic bag and place into trash. Cleanse the wound with Wound Cleanser prior to applying a clean dressing using gauze sponges, not tissues or cotton balls. Do not scrub or  use excessive force. Pat dry using gauze sponges, not tissue or cotton balls. [name], [name] ([id number]) Peri-Wound Care Desitin Maximum Strength Ointment 4 (oz) Discharge Instruction: Apply around wound edges to avoid maceration Triamcinolone Acetonide Cream, 0.1%, 15 (g) tube Discharge Instruction: Apply to leg Topical Clobetasol Propionate ointment 0.05%, 60 (g) tube Discharge Instruction: Pt to bring to appt- applied to wound bed Primary Dressing Promogran Matrix 4.34 (in) Discharge Instruction: Moisten w/normal saline or sterile water; Cover wound as directed. Do not remove from wound bed. Secondary Dressing (NON-BORDER) Zetuvit Plus Silicone NON-BORDER 5x5 (in/in) Secured With Compression Wrap Medichoice 4 layer Compression System, 35-40 mmHG Discharge Instruction: Apply multi-layer wrap as directed. Compression Stockings Environmental education officer) Signed: 12/17/2021 5:09:07 PM By: [name], [name], RN, [name], [name], [name] Entered By: [name], [name], RN, [name], [name] on 12/17/2021 08:31:47

## 2021-12-18 ENCOUNTER — Other Ambulatory Visit: Payer: Self-pay

## 2021-12-18 DIAGNOSIS — F3342 Major depressive disorder, recurrent, in full remission: Secondary | ICD-10-CM

## 2021-12-18 MED ORDER — VENLAFAXINE HCL ER 150 MG PO CP24: 150.0000 mg | ORAL_CAPSULE | Freq: Every day | ORAL | 2 refills | Status: DC

## 2021-12-19 ENCOUNTER — Encounter: Payer: 59 | Attending: Physician Assistant

## 2021-12-19 DIAGNOSIS — F17208 Nicotine dependence, unspecified, with other nicotine-induced disorders: Secondary | ICD-10-CM | POA: Insufficient documentation

## 2021-12-19 DIAGNOSIS — L88 Pyoderma gangrenosum: Secondary | ICD-10-CM | POA: Insufficient documentation

## 2021-12-19 DIAGNOSIS — G4730 Sleep apnea, unspecified: Secondary | ICD-10-CM | POA: Diagnosis not present

## 2021-12-19 DIAGNOSIS — I872 Venous insufficiency (chronic) (peripheral): Secondary | ICD-10-CM | POA: Diagnosis not present

## 2021-12-19 DIAGNOSIS — I10 Essential (primary) hypertension: Secondary | ICD-10-CM | POA: Diagnosis not present

## 2021-12-19 DIAGNOSIS — L97222 Non-pressure chronic ulcer of left calf with fat layer exposed: Secondary | ICD-10-CM | POA: Insufficient documentation

## 2021-12-19 DIAGNOSIS — E11622 Type 2 diabetes mellitus with other skin ulcer: Secondary | ICD-10-CM | POA: Diagnosis present

## 2021-12-19 NOTE — Progress Notes (Signed)
[name], [name] ([id number]) Visit Report for 12/19/2021 Physician Orders Details Patient Name: [name], [name] Date of Service: 12/19/2021 8:00 AM Medical Record Number: [id number] Patient Account Number: [id number] Date of Birth/Sex: [birth year] (41 y.o. M) Treating RN: Levora Dredge Primary Care Provider: [name] Other Clinician: Referring Provider: [name] Treating Provider/Extender: Yaakov Guthrie in Treatment: [phone] Verbal / Phone Orders: No Diagnosis Coding Follow-up Appointments o Return Appointment in 1 week. o Nurse Visit as needed - twice a week Bathing/ Shower/ Hygiene o Clean wound with Normal Saline or wound cleanser. o May shower with wound dressing protected with water repellent cover or cast protector. o No tub bath. Cellular or Tissue Based Products Wound #1 Left,Lateral Lower Leg o Cellular or Tissue Based Product applied to wound bed; including contact layer, fixation with steri-strips, dry gauze and cover dressing. (DO NOT REMOVE). - no apligraf applied today, waiting on approval Edema Control - Lymphedema / Segmental Compressive Device / Other o Optional: One layer of unna paste to top of compression wrap (to act as an anchor). - He needs this to hold it up o Elevate, Exercise Daily and Avoid Standing for Long Periods of Time. o Elevate legs to the level of the heart and pump ankles as often as possible o Elevate leg(s) parallel to the floor when sitting. Wound Treatment Wound #1 - Lower Leg Wound Laterality: Left, Lateral Cleanser: Wound Cleanser 3 x Per Week/30 Days Discharge Instructions: Wash your hands with soap and water. Remove old dressing, discard into plastic bag and place into trash. Cleanse the wound with Wound Cleanser prior to applying a clean dressing using gauze sponges, not tissues or cotton balls. Do not scrub or use excessive force. Pat dry using gauze sponges, not tissue or cotton balls. Peri-Wound Care:  Desitin Maximum Strength Ointment 4 (oz) 3 x Per Week/30 Days Discharge Instructions: Apply around wound edges to avoid maceration Peri-Wound Care: Triamcinolone Acetonide Cream, 0.1%, 15 (g) tube 3 x Per Week/30 Days Discharge Instructions: Apply to leg Topical: Clobetasol Propionate ointment 0.05%, 60 (g) tube 3 x Per Week/30 Days Discharge Instructions: Pt to bring to appt- applied to wound bed Primary Dressing: Promogran Matrix 4.34 (in) 3 x Per Week/30 Days Discharge Instructions: Moisten w/normal saline or sterile water; Cover wound as directed. Do not remove from wound bed. Secondary Dressing: (NON-BORDER) Zetuvit Plus Silicone NON-BORDER 5x5 (in/in) 3 x Per Week/30 Days Compression Wrap: Medichoice 4 layer Compression System, 35-40 mmHG 3 x Per Week/30 Days Discharge Instructions: Apply multi-layer wrap as directed. Electronic Signature(s) Signed: 12/19/2021 10:27:22 AM By: Kalman Shan DO Signed: 12/19/2021 4:19:14 PM By: Levora Dredge Entered By: Levora Dredge on 12/19/2021 08:25:42 [name], [name] ([id number]) [name], [name] ([id number]) -------------------------------------------------------------------------------- SuperBill Details Patient Name: [name] Date of Service: 12/19/2021 Medical Record Number: [id number] Patient Account Number: [id number] Date of Birth/Sex: [birth year] (41 y.o. 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

## 2021-12-19 NOTE — Progress Notes (Signed)
CLIF, SERIO ([id number]) Visit Report for 12/19/2021 Arrival Information Details Patient Name: [name], [name] Date of Service: 12/19/2021 8:00 AM Medical Record Number: [id number] Patient Account Number: [id number] Date of Birth/Sex: 1981/04/24 (41 y.o. M) Treating RN: Levora Dredge Primary Care Raynald Rouillard: [name] Other Clinician: Referring Jennavecia Schwier: [name] Treating Shakisha Abend/Extender: Yaakov Guthrie in Treatment: 263 Visit Information History Since Last Visit Added or deleted any medications: No Patient Arrived: Ambulatory Any new allergies or adverse reactions: No Arrival Time: 08:24 Had a fall or experienced change in No Accompanied By: self activities of daily living that may affect Transfer Assistance: None risk of falls: Patient Identification Verified: Yes Hospitalized since last visit: No Secondary Verification Process Completed: Yes Has Dressing in Place as Prescribed: Yes Patient Requires Transmission-Based No Has Compression in Place as Prescribed: Yes Precautions: Pain Present Now: No Patient Has Alerts: Yes Patient Alerts: Patient has reaction to silver dressings. Electronic Signature(s) Signed: 12/19/2021 4:19:14 PM By: Levora Dredge Entered By: Levora Dredge on 12/19/2021 08:24:26 [name], [name] ([id number]) -------------------------------------------------------------------------------- Clinic Level of Care Assessment Details Patient Name: [name] Date of Service: 12/19/2021 8:00 AM Medical Record Number: [id number] Patient Account Number: [id number] Date of Birth/Sex: December 31, 1980 (40 y.o. M) Treating RN: Levora Dredge Primary Care Kayley Zeiders: [name] Other Clinician: Referring Morgin [name]: [name] Treating Demontre Padin/Extender: Yaakov Guthrie in Treatment: 263 Clinic Level of Care Assessment Items TOOL 1 Quantity Score []  - Use when EandM and Procedure is performed on INITIAL visit 0 ASSESSMENTS - Nursing  Assessment / Reassessment []  - General Physical Exam (combine w/ comprehensive assessment (listed just below) when performed on new 0 pt. evals) []  - 0 Comprehensive Assessment (HX, ROS, Risk Assessments, Wounds Hx, etc.) ASSESSMENTS - Wound and Skin Assessment / Reassessment []  - Dermatologic / Skin Assessment (not related to wound area) 0 ASSESSMENTS - Ostomy and/or Continence Assessment and Care []  - Incontinence Assessment and Management 0 []  - 0 Ostomy Care Assessment and Management (repouching, etc.) PROCESS - Coordination of Care []  - Simple Patient / Family Education for ongoing care 0 []  - 0 Complex (extensive) Patient / Family Education for ongoing care []  - 0 Staff obtains Programmer, systems, Records, Test Results / Process Orders []  - 0 Staff telephones HHA, Nursing Homes / Clarify orders / etc []  - 0 Routine Transfer to another Facility (non-emergent condition) []  - 0 Routine Hospital Admission (non-emergent condition) []  - 0 New Admissions / Biomedical engineer / Ordering NPWT, Apligraf, etc. []  - 0 Emergency Hospital Admission (emergent condition) PROCESS - Special Needs []  - Pediatric / Minor Patient Management 0 []  - 0 Isolation Patient Management []  - 0 Hearing / Language / Visual special needs []  - 0 Assessment of Community assistance (transportation, D/C planning, etc.) []  - 0 Additional assistance / Altered mentation []  - 0 Support Surface(s) Assessment (bed, cushion, seat, etc.) INTERVENTIONS - Miscellaneous []  - External ear exam 0 []  - 0 Patient Transfer (multiple staff / Civil Service fast streamer / Similar devices) []  - 0 Simple Staple / Suture removal (25 or less) []  - 0 Complex Staple / Suture removal (26 or more) []  - 0 Hypo/Hyperglycemic Management (do not check if billed separately) []  - 0 Ankle / Brachial Index (ABI) - do not check if billed separately Has the patient been seen at the hospital within the last three years: Yes Total Score: 0 Level Of  Care: ____ [name] ([id number]) Electronic Signature(s) Signed: 12/19/2021 4:19:14 PM By: Levora Dredge Entered By: Levora Dredge on 12/19/2021 [place] ([id number]) --------------------------------------------------------------------------------  Compression Therapy Details Patient Name: [name], [name] Date of Service: 12/19/2021 8:00 AM Medical Record Number: [id number] Patient Account Number: [id number] Date of Birth/Sex: [birth year] (41 y.o. M) Treating RN: Levora Dredge Primary Care Darrow Barreiro: [name] Other Clinician: Referring Sue Fernicola: [name] Treating Terryon Pineiro/Extender: Yaakov Guthrie in Treatment: 263 Compression Therapy Performed for Wound Assessment: Wound #1 Left,Lateral Lower Leg Performed By: Clinician Levora Dredge, RN Compression Type: Four Layer Notes pt tolerated wrap without issue Electronic Signature(s) Signed: 12/19/2021 4:19:14 PM By: Levora Dredge Entered By: Levora Dredge on 12/19/2021 08:25:29 [name], [name] ([id number]) -------------------------------------------------------------------------------- Encounter Discharge Information Details Patient Name: [name] Date of Service: 12/19/2021 8:00 AM Medical Record Number: [id number] Patient Account Number: [id number] Date of Birth/Sex: [birth year] (40 y.o. M) Treating RN: Levora Dredge Primary Care Aleighya Mcanelly: [name] Other Clinician: Referring Wyatte Dames: [name] Treating Cloee Dunwoody/Extender: Yaakov Guthrie in Treatment: 263 Encounter Discharge Information Items Discharge Condition: Stable Ambulatory Status: Ambulatory Discharge Destination: Home Transportation: Private Auto Accompanied By: self Schedule Follow-up Appointment: Yes Clinical Summary of Care: Electronic Signature(s) Signed: 12/19/2021 4:19:14 PM By: Levora Dredge Entered By: Levora Dredge on 12/19/2021 08:26:06 [name]  ([id number]) -------------------------------------------------------------------------------- Wound Assessment Details Patient Name: [name] Date of Service: 12/19/2021 8:00 AM Medical Record Number: [id number] Patient Account Number: [id number] Date of Birth/Sex: 14-Jan-1981 (40 y.o. M) Treating RN: Levora Dredge Primary Care Lasasha Brophy: [name] Other Clinician: Referring Dajiah Kooi: [name] Treating Cassie Henkels/Extender: Yaakov Guthrie in Treatment: 263 Wound Status Wound Number: 1 Primary Etiology: Pyoderma Wound Location: Left, Lateral Lower Leg Wound Status: Open Wounding Event: Gradually Appeared Comorbid History: Sleep Apnea, Hypertension, Colitis Date Acquired: 11/18/2015 Weeks Of Treatment: 263 Clustered Wound: No Wound Measurements Length: (cm) 2.5 Width: (cm) 2 Depth: (cm) 0.1 Area: (cm) 3.927 Volume: (cm) 0.393 % Reduction in Area: 20% % Reduction in Volume: 90% Epithelialization: Medium (34-66%) Tunneling: No Undermining: No Wound Description Classification: Full Thickness With Exposed Support Structures Exudate Amount: Medium Exudate Type: Serosanguineous Exudate Color: red, brown Foul Odor After Cleansing: No Slough/Fibrino No Wound Bed Granulation Amount: Large (67-100%) Exposed Structure Granulation Quality: Red Fascia Exposed: No Necrotic Amount: Small (1-33%) Fat Layer (Subcutaneous Tissue) Exposed: No Necrotic Quality: Adherent Slough Tendon Exposed: No Muscle Exposed: No Joint Exposed: No Bone Exposed: No Treatment Notes Wound #1 (Lower Leg) Wound Laterality: Left, Lateral Cleanser Wound Cleanser Discharge Instruction: Wash your hands with soap and water. Remove old dressing, discard into plastic bag and place into trash. Cleanse the wound with Wound Cleanser prior to applying a clean dressing using gauze sponges, not tissues or cotton balls. Do not scrub or use excessive force. Pat dry using gauze sponges, not tissue  or cotton balls. Peri-Wound Care Desitin Maximum Strength Ointment 4 (oz) Discharge Instruction: Apply around wound edges to avoid maceration Triamcinolone Acetonide Cream, 0.1%, 15 (g) tube Discharge Instruction: Apply to leg Topical Clobetasol Propionate ointment 0.05%, 60 (g) tube Discharge Instruction: Pt to bring to appt- applied to wound bed Primary Dressing Promogran Matrix 4.34 (in) Discharge Instruction: Moisten w/normal saline or sterile water; Cover wound as directed. Do not remove from wound bed. [name], [name] ([id number]) Secondary Dressing (NON-BORDER) Zetuvit Plus Silicone NON-BORDER 5x5 (in/in) Secured With Compression Wrap Medichoice 4 layer Compression System, 35-40 mmHG Discharge Instruction: Apply multi-layer wrap as directed. Compression Stockings Add-Ons Electronic Signature(s) Signed: 12/19/2021 4:19:14 PM By: Levora Dredge Entered By: Levora Dredge on 12/19/2021 08:24:53

## 2021-12-21 ENCOUNTER — Encounter: Payer: 59 | Admitting: Physician Assistant

## 2021-12-21 DIAGNOSIS — E11622 Type 2 diabetes mellitus with other skin ulcer: Secondary | ICD-10-CM | POA: Diagnosis not present

## 2021-12-24 DIAGNOSIS — E11622 Type 2 diabetes mellitus with other skin ulcer: Secondary | ICD-10-CM | POA: Diagnosis not present

## 2021-12-24 NOTE — Progress Notes (Signed)
CORLEY, MAFFEO ([id number]) Visit Report for 12/21/2021 Arrival Information Details Patient Name: [name], [name] Date of Service: 12/21/2021 8:15 AM Medical Record Number: [id number] Patient Account Number: [id number] Date of Birth/Sex: 29-Jul-1980 (41 y.o. M) Treating RN: [name] Primary Care Zacharias Ridling: [name] Other Clinician: Massie Kluver Referring Bridgitte Felicetti: [name] Treating Aubrianne Molyneux/Extender: [name] Cliche in Treatment: 263 Visit Information History Since Last Visit All ordered tests and consults were completed: No Patient Arrived: Ambulatory Added or deleted any medications: No Arrival Time: 08:06 Any new allergies or adverse reactions: No Transfer Assistance: None Had a fall or experienced change in No Patient Requires Transmission-Based No activities of daily living that may affect Precautions: risk of falls: Patient Has Alerts: Yes Hospitalized since last visit: No Patient Alerts: Patient has reaction Pain Present Now: No to silver dressings. Electronic Signature(s) Signed: 12/24/2021 4:15:07 PM By: Massie Kluver Entered By: Massie Kluver on 12/21/2021 08:10:45 [name] ([id number]) -------------------------------------------------------------------------------- Clinic Level of Care Assessment Details Patient Name: [name], [name] Date of Service: 12/21/2021 8:15 AM Medical Record Number: [id number] Patient Account Number: [id number] Date of Birth/Sex: July 16, 1980 (41 y.o. M) Treating RN: [name] Primary Care Aengus Sauceda: [name] Other Clinician: Massie Kluver Referring Virlan Kempker: [name] Treating Tomiko Schoon/Extender: [name] Cliche in Treatment: 263 Clinic Level of Care Assessment Items TOOL 1 Quantity Score []  - Use when EandM and Procedure is performed on INITIAL visit 0 ASSESSMENTS - Nursing Assessment / Reassessment []  - General Physical Exam (combine w/ comprehensive assessment (listed just below) when performed on  new 0 pt. evals) []  - 0 Comprehensive Assessment (HX, ROS, Risk Assessments, Wounds Hx, etc.) ASSESSMENTS - Wound and Skin Assessment / Reassessment []  - Dermatologic / Skin Assessment (not related to wound area) 0 ASSESSMENTS - Ostomy and/or Continence Assessment and Care []  - Incontinence Assessment and Management 0 []  - 0 Ostomy Care Assessment and Management (repouching, etc.) PROCESS - Coordination of Care []  - Simple Patient / Family Education for ongoing care 0 []  - 0 Complex (extensive) Patient / Family Education for ongoing care []  - 0 Staff obtains Programmer, systems, Records, Test Results / Process Orders []  - 0 Staff telephones HHA, Nursing Homes / Clarify orders / etc []  - 0 Routine Transfer to another Facility (non-emergent condition) []  - 0 Routine Hospital Admission (non-emergent condition) []  - 0 New Admissions / Biomedical engineer / Ordering NPWT, Apligraf, etc. []  - 0 Emergency Hospital Admission (emergent condition) PROCESS - Special Needs []  - Pediatric / Minor Patient Management 0 []  - 0 Isolation Patient Management []  - 0 Hearing / Language / Visual special needs []  - 0 Assessment of Community assistance (transportation, D/C planning, etc.) []  - 0 Additional assistance / Altered mentation []  - 0 Support Surface(s) Assessment (bed, cushion, seat, etc.) INTERVENTIONS - Miscellaneous []  - External ear exam 0 []  - 0 Patient Transfer (multiple staff / Civil Service fast streamer / Similar devices) []  - 0 Simple Staple / Suture removal (25 or less) []  - 0 Complex Staple / Suture removal (26 or more) []  - 0 Hypo/Hyperglycemic Management (do not check if billed separately) []  - 0 Ankle / Brachial Index (ABI) - do not check if billed separately Has the patient been seen at the hospital within the last three years: Yes Total Score: 0 Level Of Care: ____ [name] ([id number]) Electronic Signature(s) Signed: 12/24/2021 4:15:07 PM By: Massie Kluver Entered By:  Massie Kluver on 12/21/2021 08:42:29 [name], [name] ([id number]) -------------------------------------------------------------------------------- Compression Therapy Details Patient Name: [name] Date of Service: 12/21/2021 8:15 AM Medical  Record Number: [id number] Patient Account Number: [id number] Date of Birth/Sex: 1980/09/22 (40 y.o. M) Treating RN: [name] Primary Care Jeanny Rymer: [name] Other Clinician: Massie Kluver Referring Carlon Chaloux: [name] Treating Satin Boal/Extender: [name] Cliche in Treatment: 263 Compression Therapy Performed for Wound Assessment: Wound #1 Left,Lateral Lower Leg Performed By: Clinician [name], RN Compression Type: Four Layer Pre Treatment ABI: 1.2 Post Procedure Diagnosis Same as Pre-procedure Electronic Signature(s) Signed: 12/24/2021 4:15:07 PM By: Massie Kluver Entered By: Massie Kluver on 12/21/2021 08:33:41 [name], [name] ([id number]) -------------------------------------------------------------------------------- Encounter Discharge Information Details Patient Name: [name] Date of Service: 12/21/2021 8:15 AM Medical Record Number: [id number] Patient Account Number: [id number] Date of Birth/Sex: March 14, 1981 (40 y.o. M) Treating RN: [name] Primary Care Morris Markham: [name] Other Clinician: Massie Kluver Referring Vonzella Althaus: [name] Treating Kayte Borchard/Extender: [name] Cliche in Treatment: 263 Encounter Discharge Information Items Post Procedure Vitals Discharge Condition: Stable Temperature (F): 98.3 Ambulatory Status: Ambulatory Pulse (bpm): 87 Discharge Destination: Home Respiratory Rate (breaths/min): 16 Transportation: Private Auto Blood Pressure (mmHg): 121/82 Accompanied By: self Schedule Follow-up Appointment: Yes Clinical Summary of Care: Electronic Signature(s) Signed: 12/24/2021 4:15:07 PM By: Massie Kluver Entered By: Massie Kluver on 12/21/2021 08:58:21 [name], [name]  ([id number]) -------------------------------------------------------------------------------- Lower Extremity Assessment Details Patient Name: [name] Date of Service: 12/21/2021 8:15 AM Medical Record Number: [id number] Patient Account Number: [id number] Date of Birth/Sex: [birth year] (40 y.o. M) Treating RN: [name] Primary Care Aleya Durnell: [name] Other Clinician: Massie Kluver Referring Sayler Mickiewicz: [name] Treating Thomson Herbers/Extender: [name] Cliche in Treatment: 263 Edema Assessment Assessed: [Left: Yes] [Right: No] Edema: [Left: Ye] [Right: s] Calf Left: Right: Point of Measurement: 35 cm From Medial Instep 46 cm Ankle Left: Right: Point of Measurement: 10 cm From Medial Instep 30 cm Vascular Assessment Pulses: Dorsalis Pedis Palpable: [Left:Yes] Posterior Tibial Palpable: [Left:Yes] Electronic Signature(s) Signed: 12/21/2021 3:39:08 PM By: [name], [name], RN, [name], [name], [name] Signed: 12/24/2021 4:15:07 PM By: Massie Kluver Entered By: Massie Kluver on 12/21/2021 08:23:12 [name], [name] ([id number]) -------------------------------------------------------------------------------- Multi Wound Chart Details Patient Name: [name] Date of Service: 12/21/2021 8:15 AM Medical Record Number: [id number] Patient Account Number: [id number] Date of Birth/Sex: 1980-10-29 (40 y.o. M) Treating RN: [name] Primary Care Latonia Conrow: [name] Other Clinician: Massie Kluver Referring Cissy Galbreath: [name] Treating Zoi Devine/Extender: [name] Cliche in Treatment: 263 Vital Signs Height(in): 71 Pulse(bpm): 87 Weight(lbs): 338 Blood Pressure(mmHg): 121/82 Body Mass Index(BMI): 47.1 Temperature(F): 98.3 Respiratory Rate(breaths/min): 16 Photos: [N/A:N/A] Wound Location: Left, Lateral Lower Leg N/A N/A Wounding Event: Gradually Appeared N/A N/A Primary Etiology: Pyoderma N/A N/A Comorbid History: Sleep Apnea, Hypertension, Colitis N/A N/A Date  Acquired: 11/18/2015 N/A N/A Weeks of Treatment: 263 N/A N/A Wound Status: Open N/A N/A Wound Recurrence: No N/A N/A Measurements L x W x D (cm) 2.4x1.1x0.1 N/A N/A Area (cm) : 2.073 N/A N/A Volume (cm) : 0.207 N/A N/A % Reduction in Area: 57.80% N/A N/A % Reduction in Volume: 94.70% N/A N/A Classification: Full Thickness With Exposed N/A N/A Support Structures Exudate Amount: Medium N/A N/A Exudate Type: Serosanguineous N/A N/A Exudate Color: red, brown N/A N/A Granulation Amount: Large (67-100%) N/A N/A Granulation Quality: Red N/A N/A Necrotic Amount: Small (1-33%) N/A N/A Exposed Structures: Fascia: No N/A N/A Fat Layer (Subcutaneous Tissue): No Tendon: No Muscle: No Joint: No Bone: No Epithelialization: Medium (34-66%) N/A N/A Treatment Notes Electronic Signature(s) Signed: 12/24/2021 4:15:07 PM By: Massie Kluver Entered By: Massie Kluver on 12/21/2021 08:23:34 [name], [name] ([id number]) -------------------------------------------------------------------------------- [name],  [name] Date of Service: 12/21/2021 8:15 AM Medical Record Number: [id number] Patient Account Number: [id number] Date of Birth/Sex: April 25, 1981 (41 y.o. M) Treating RN: [name] Primary Care Areen Trautner: [name] Other Clinician: Massie Kluver Referring Paddy Neis: [name] Treating Pablo Mathurin/Extender: [name] Cliche in Treatment: [place] reviewed with physician Active Inactive Electronic Signature(s) Signed: 12/21/2021 3:39:08 PM By: [name] [name], RN, [name], [name], [name] Signed: 12/24/2021 4:15:07 PM By: Massie Kluver Entered By: Massie Kluver on 12/21/2021 08:23:19 [name], [name] ([id number]) -------------------------------------------------------------------------------- Pain Assessment Details Patient Name: [name] Date of Service: 12/21/2021 8:15 AM Medical Record Number: [id number] Patient Account  Number: [id number] Date of Birth/Sex: 1980-11-01 (40 y.o. M) Treating RN: [name] Primary Care Talyia Allende: [name] Other Clinician: Massie Kluver Referring Brinn Westby: [name] Treating Amario Longmore/Extender: [name] Cliche in Treatment: 263 Active Problems Location of Pain Severity and Description of Pain Patient Has Paino No Site Locations Pain Management and Medication Current Pain Management: Electronic Signature(s) Signed: 12/21/2021 3:39:08 PM By: [name], [name], RN, [name], [name], [name] Signed: 12/24/2021 4:15:07 PM By: Massie Kluver Entered By: Massie Kluver on 12/21/2021 08:13:16 [name] ([id number]) -------------------------------------------------------------------------------- Patient/Caregiver Education Details Patient Name: [name] Date of Service: 12/21/2021 8:15 AM Medical Record Number: [id number] Patient Account Number: [id number] Date of Birth/Gender: 10/04/1980 (40 y.o. M) Treating RN: [name] Primary Care Physician: [name] Other Clinician: Massie Kluver Referring Physician: [name] Treating Physician/Extender: [name] Cliche in Treatment: 263 Education Assessment Education Provided To: Patient Education Topics Provided Wound/Skin Impairment: Handouts: Other: continue wound care as directed Methods: Explain/Verbal Responses: State content correctly Electronic Signature(s) Signed: 12/24/2021 4:15:07 PM By: Massie Kluver Entered By: Massie Kluver on 12/21/2021 [place], [name] ([id number]) -------------------------------------------------------------------------------- Wound Assessment Details Patient Name: [name] Date of Service: 12/21/2021 8:15 AM Medical Record Number: [id number] Patient Account Number: [id number] Date of Birth/Sex: 18-Oct-1980 (40 y.o. M) Treating RN: [name] Primary Care Maximilien Hayashi: [name] Other Clinician: Massie Kluver Referring Mathieu Schloemer: [name] Treating  Renella Steig/Extender: [name] Cliche in Treatment: 263 Wound Status Wound Number: 1 Primary Etiology: Pyoderma Wound Location: Left, Lateral Lower Leg Wound Status: Open Wounding Event: Gradually Appeared Comorbid History: Sleep Apnea, Hypertension, Colitis Date Acquired: 11/18/2015 Weeks Of Treatment: 263 Clustered Wound: No Photos Wound Measurements Length: (cm) 2.4 Width: (cm) 1.1 Depth: (cm) 0.1 Area: (cm) 2.073 Volume: (cm) 0.207 % Reduction in Area: 57.8% % Reduction in Volume: 94.7% Epithelialization: Medium (34-66%) Wound Description Classification: Full Thickness With Exposed Support Structures Exudate Amount: Medium Exudate Type: Serosanguineous Exudate Color: red, brown Foul Odor After Cleansing: No Slough/Fibrino No Wound Bed Granulation Amount: Large (67-100%) Exposed Structure Granulation Quality: Red Fascia Exposed: No Necrotic Amount: Small (1-33%) Fat Layer (Subcutaneous Tissue) Exposed: No Necrotic Quality: Adherent Slough Tendon Exposed: No Muscle Exposed: No Joint Exposed: No Bone Exposed: No Electronic Signature(s) Signed: 12/21/2021 3:39:08 PM By: [name], [name], RN, [name], [name], [name] Signed: 12/24/2021 4:15:07 PM By: Massie Kluver Entered By: Massie Kluver on 12/21/2021 08:22:07 [name] ([id number]) -------------------------------------------------------------------------------- [place] Details Patient Name: [name] Date of Service: 12/21/2021 8:15 AM Medical Record Number: [id number] Patient Account Number: [id number] Date of Birth/Sex: Oct 02, 1980 (40 y.o. M) Treating RN: [name] Primary Care Denys Salinger: [name] Other Clinician: Massie Kluver Referring Abdalla Naramore: [name] Treating Mariyah Upshaw/Extender: [name] Cliche in Treatment: 263 Vital Signs Time Taken: 08:11 Temperature (F): 98.3 Height (in): 71 Pulse (bpm): 87 Weight (lbs): 338 Respiratory Rate (breaths/min): 16 Body Mass Index (BMI): 47.1 Blood Pressure  (mmHg): 121/82 Reference Range: 80 -  120 mg / dl Electronic Signature(s) Signed: 12/24/2021 4:15:07 PM By: Massie Kluver Entered By: Massie Kluver on 12/21/2021 08:13:10

## 2021-12-24 NOTE — Progress Notes (Signed)
[name], [name] ([id number]) Visit Report for 12/24/2021 Arrival Information Details Patient Name: [name], [name] Date of Service: 12/24/2021 8:00 AM Medical Record Number: [id number] Patient Account Number: [id number] Date of Birth/Sex: 1980/06/25 (41 y.o. M) Treating RN: Levora Dredge Primary Care Nealy Hickmon: [name] Other Clinician: Referring Maddilyn Campus: [name] Treating Vienna Folden/Extender: [name] Cliche in Treatment: 263 Visit Information History Since Last Visit Added or deleted any medications: No Patient Arrived: Ambulatory Any new allergies or adverse reactions: No Arrival Time: 08:24 Had a fall or experienced change in No Accompanied By: self activities of daily living that may affect Transfer Assistance: None risk of falls: Patient Identification Verified: Yes Hospitalized since last visit: No Secondary Verification Process Completed: Yes Has Dressing in Place as Prescribed: Yes Patient Requires Transmission-Based No Has Compression in Place as Prescribed: Yes Precautions: Pain Present Now: No Patient Has Alerts: Yes Patient Alerts: Patient has reaction to silver dressings. Electronic Signature(s) Signed: 12/24/2021 4:31:15 PM By: Levora Dredge Entered By: Levora Dredge on 12/24/2021 08:25:01 [name], [name] ([id number]) -------------------------------------------------------------------------------- Clinic Level of Care Assessment Details Patient Name: [name], [name] Date of Service: 12/24/2021 8:00 AM Medical Record Number: [id number] Patient Account Number: [id number] Date of Birth/Sex: 06-30-1980 (40 y.o. M) Treating RN: Levora Dredge Primary Care Japji Kok: [name] Other Clinician: Referring Clarity Ciszek: [name] Treating Ryleigh Buenger/Extender: [name] Cliche in Treatment: 263 Clinic Level of Care Assessment Items TOOL 1 Quantity Score []  - Use when EandM and Procedure is performed on INITIAL visit 0 ASSESSMENTS - Nursing Assessment /  Reassessment []  - General Physical Exam (combine w/ comprehensive assessment (listed just below) when performed on new 0 pt. evals) []  - 0 Comprehensive Assessment (HX, ROS, Risk Assessments, Wounds Hx, etc.) ASSESSMENTS - Wound and Skin Assessment / Reassessment []  - Dermatologic / Skin Assessment (not related to wound area) 0 ASSESSMENTS - Ostomy and/or Continence Assessment and Care []  - Incontinence Assessment and Management 0 []  - 0 Ostomy Care Assessment and Management (repouching, etc.) PROCESS - Coordination of Care []  - Simple Patient / Family Education for ongoing care 0 []  - 0 Complex (extensive) Patient / Family Education for ongoing care []  - 0 Staff obtains Programmer, systems, Records, Test Results / Process Orders []  - 0 Staff telephones HHA, Nursing Homes / Clarify orders / etc []  - 0 Routine Transfer to another Facility (non-emergent condition) []  - 0 Routine Hospital Admission (non-emergent condition) []  - 0 New Admissions / Biomedical engineer / Ordering NPWT, Apligraf, etc. []  - 0 Emergency Hospital Admission (emergent condition) PROCESS - Special Needs []  - Pediatric / Minor Patient Management 0 []  - 0 Isolation Patient Management []  - 0 Hearing / Language / Visual special needs []  - 0 Assessment of Community assistance (transportation, D/C planning, etc.) []  - 0 Additional assistance / Altered mentation []  - 0 Support Surface(s) Assessment (bed, cushion, seat, etc.) INTERVENTIONS - Miscellaneous []  - External ear exam 0 []  - 0 Patient Transfer (multiple staff / Civil Service fast streamer / Similar devices) []  - 0 Simple Staple / Suture removal (25 or less) []  - 0 Complex Staple / Suture removal (26 or more) []  - 0 Hypo/Hyperglycemic Management (do not check if billed separately) []  - 0 Ankle / Brachial Index (ABI) - do not check if billed separately Has the patient been seen at the hospital within the last three years: Yes Total Score: 0 Level Of Care:  ____ [name] ([id number]) Electronic Signature(s) Signed: 12/24/2021 4:31:15 PM By: Levora Dredge Entered By: Levora Dredge on 12/24/2021 08:26:35 [name], [name] ([id number]) --------------------------------------------------------------------------------  Compression Therapy Details Patient Name: [name], [name] Date of Service: 12/24/2021 8:00 AM Medical Record Number: [id number] Patient Account Number: [id number] Date of Birth/Sex: [birth year] (40 y.o. M) Treating RN: Levora Dredge Primary Care Glena Pharris: [name] Other Clinician: Referring Zoanne Newill: [name] Treating Esbeidy Mclaine/Extender: [name] Cliche in Treatment: 263 Compression Therapy Performed for Wound Assessment: Wound #1 Left,Lateral Lower Leg Performed By: Clinician Levora Dredge, RN Compression Type: Four Layer Notes pt tolerated wrap without issue Electronic Signature(s) Signed: 12/24/2021 4:31:15 PM By: Levora Dredge Entered By: Levora Dredge on 12/24/2021 08:25:47 [name], [name] ([id number]) -------------------------------------------------------------------------------- Encounter Discharge Information Details Patient Name: [name] Date of Service: 12/24/2021 8:00 AM Medical Record Number: [id number] Patient Account Number: [id number] Date of Birth/Sex: January 18, 1981 (40 y.o. M) Treating RN: Levora Dredge Primary Care Vihana Kydd: [name] Other Clinician: Referring Annella Prowell: [name] Treating Corrin Sieling/Extender: [name] Cliche in Treatment: 263 Encounter Discharge Information Items Discharge Condition: Stable Ambulatory Status: Ambulatory Discharge Destination: Home Transportation: Private Auto Accompanied By: self Schedule Follow-up Appointment: Yes Clinical Summary of Care: Electronic Signature(s) Signed: 12/24/2021 4:31:15 PM By: Levora Dredge Entered By: Levora Dredge on 12/24/2021 08:26:30 [name], [name]  ([id number]) -------------------------------------------------------------------------------- Wound Assessment Details Patient Name: [name] Date of Service: 12/24/2021 8:00 AM Medical Record Number: [id number] Patient Account Number: [id number] Date of Birth/Sex: 1980/07/25 (40 y.o. M) Treating RN: Levora Dredge Primary Care Quincey Nored: [name] Other Clinician: Referring Wanita Derenzo: [name] Treating Naturi Alarid/Extender: [name] Cliche in Treatment: 263 Wound Status Wound Number: 1 Primary Etiology: Pyoderma Wound Location: Left, Lateral Lower Leg Wound Status: Open Wounding Event: Gradually Appeared Comorbid History: Sleep Apnea, Hypertension, Colitis Date Acquired: 11/18/2015 Weeks Of Treatment: 263 Clustered Wound: No Wound Measurements Length: (cm) 2.4 Width: (cm) 1.1 Depth: (cm) 0.1 Area: (cm) 2.073 Volume: (cm) 0.207 % Reduction in Area: 57.8% % Reduction in Volume: 94.7% Epithelialization: Medium (34-66%) Tunneling: No Undermining: No Wound Description Classification: Full Thickness With Exposed Support Structures Exudate Amount: Medium Exudate Type: Serosanguineous Exudate Color: red, brown Foul Odor After Cleansing: No Slough/Fibrino No Wound Bed Granulation Amount: Large (67-100%) Exposed Structure Granulation Quality: Red Fascia Exposed: No Necrotic Amount: Small (1-33%) Fat Layer (Subcutaneous Tissue) Exposed: No Necrotic Quality: Adherent Slough Tendon Exposed: No Muscle Exposed: No Joint Exposed: No Bone Exposed: No Treatment Notes Wound #1 (Lower Leg) Wound Laterality: Left, Lateral Cleanser Wound Cleanser Discharge Instruction: Wash your hands with soap and water. Remove old dressing, discard into plastic bag and place into trash. Cleanse the wound with Wound Cleanser prior to applying a clean dressing using gauze sponges, not tissues or cotton balls. Do not scrub or use excessive force. Pat dry using gauze sponges, not tissue  or cotton balls. Peri-Wound Care Desitin Maximum Strength Ointment 4 (oz) Discharge Instruction: Apply around wound edges to avoid maceration Triamcinolone Acetonide Cream, 0.1%, 15 (g) tube Discharge Instruction: Apply to leg Topical Clobetasol Propionate ointment 0.05%, 60 (g) tube Discharge Instruction: Pt to bring to appt- applied to wound bed Primary Dressing Apligraf Secondary Dressing (NON-BORDER) Zetuvit Plus Silicone NON-BORDER 5x5 (in/in) Leduc, Kalonji ([id number]) Secured With Compression Wrap Medichoice 4 layer Compression System, 35-40 mmHG Discharge Instruction: Apply multi-layer wrap as directed. Compression Stockings Add-Ons Electronic Signature(s) Signed: 12/24/2021 4:31:15 PM By: Levora Dredge Entered By: Levora Dredge on 12/24/2021 94:49:67

## 2021-12-24 NOTE — Progress Notes (Signed)
CHRISOTPHER, [name] ([id number]) Visit Report for 12/24/2021 Physician Orders Details Patient Name: [name], [name] Date of Service: 12/24/2021 8:00 AM Medical Record Number: [id number] Patient Account Number: [id number] Date of Birth/Sex: 07-07-1980 (41 y.o. M) Treating RN: Levora Dredge Primary Care Provider: [name] Other Clinician: Referring Provider: [name] Treating Provider/Extender: [name] Cliche in Treatment: [phone] Verbal / Phone Orders: No Diagnosis Coding Follow-up Appointments o Return Appointment in 1 week. o Nurse Visit as needed - twice a week Bathing/ Shower/ Hygiene o Clean wound with Normal Saline or wound cleanser. o May shower with wound dressing protected with water repellent cover or cast protector. o No tub bath. Cellular or Tissue Based Products Wound #1 Left,Lateral Lower Leg o Cellular or Tissue Based Product applied to wound bed; including contact layer, fixation with steri-strips, dry gauze and cover dressing. (DO NOT REMOVE). - Apligraf #6 applied Edema Control - Lymphedema / Segmental Compressive Device / Other o Optional: One layer of unna paste to top of compression wrap (to act as an anchor). - He needs this to hold it up o Elevate, Exercise Daily and Avoid Standing for Long Periods of Time. o Elevate legs to the level of the heart and pump ankles as often as possible o Elevate leg(s) parallel to the floor when sitting. Wound Treatment Wound #1 - Lower Leg Wound Laterality: Left, Lateral Cleanser: Wound Cleanser 3 x Per Week/30 Days Discharge Instructions: Wash your hands with soap and water. Remove old dressing, discard into plastic bag and place into trash. Cleanse the wound with Wound Cleanser prior to applying a clean dressing using gauze sponges, not tissues or cotton balls. Do not scrub or use excessive force. Pat dry using gauze sponges, not tissue or cotton balls. Peri-Wound Care: Desitin Maximum Strength Ointment  4 (oz) 3 x Per Week/30 Days Discharge Instructions: Apply around wound edges to avoid maceration Peri-Wound Care: Triamcinolone Acetonide Cream, 0.1%, 15 (g) tube 3 x Per Week/30 Days Discharge Instructions: Apply to leg Topical: Clobetasol Propionate ointment 0.05%, 60 (g) tube 3 x Per Week/30 Days Discharge Instructions: Pt to bring to appt- applied to wound bed Primary Dressing: Apligraf 3 x Per Week/30 Days Secondary Dressing: (NON-BORDER) Zetuvit Plus Silicone NON-BORDER 5x5 (in/in) 3 x Per Week/30 Days Compression Wrap: Medichoice 4 layer Compression System, 35-40 mmHG 3 x Per Week/30 Days Discharge Instructions: Apply multi-layer wrap as directed. Electronic Signature(s) Signed: 12/24/2021 4:15:14 PM By: Worthy Keeler PA-C Signed: 12/24/2021 4:31:15 PM By: Levora Dredge Entered By: Levora Dredge on 12/24/2021 08:26:11 AYIDEN, MILLIMAN ([id number]) -------------------------------------------------------------------------------- SuperBill Details Patient Name: [name] Date of Service: 12/24/2021 Medical Record Number: [id number] Patient Account Number: [id number] Date of Birth/Sex: 1980-11-21 (41 y.o. 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

## 2021-12-24 NOTE — Progress Notes (Signed)
[name], [name] ([id number]) Visit Report for 12/21/2021 Chief Complaint Document Details Patient Name: [name], [name] Date of Service: 12/21/2021 8:15 AM Medical Record Number: [id number] Patient Account Number: [id number] Date of Birth/Sex: May 07, 1981 (41 y.o. 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 M) Treating RN: [name] Primary Care Provider: [name] Other Clinician: Massie Kluver Referring Provider: [name] Treating Provider/Extender: [name] Cliche in Treatment: 263 Cellular or Tissue Based Product Type Wound #1 Left,Lateral Lower Leg Applied to: Performed By: Physician Tommie Sams., PA-C Cellular or Tissue Based Product Apligraf Type: Level of Consciousness (Pre- Awake and Alert procedure): Pre-procedure Verification/Time Out Yes - 08:34 Taken: Location: trunk / arms / legs Wound Size (sq cm): 2.64 Product Size (sq cm): 44 Waste Size (sq cm): 33 Waste Reason: wound size Amount of Product Applied (sq cm): 11 Instrument Used: Blade Lot #: [id number] Order #: [id number] Expiration Date: 12/28/2021 Fenestrated: Yes Instrument: Blade Reconstituted: Yes Solution Type: normal saline Solution Amount: 12 ml Lot #: [id number] Solution Expiration Date:  07/22/2023 Secured: Yes Secured With: Steri-Strips Dressing Applied: Yes Primary Dressing: bolus Procedural Pain: 0 Post Procedural Pain: 0 Response to Treatment: Procedure was tolerated well Level of Consciousness (Post- Awake and Alert procedure): Post Procedure Diagnosis Same as Pre-procedure Electronic Signature(s) Signed: 12/24/2021 4:15:07 PM By: Massie Kluver Entered By: Massie Kluver on 12/21/2021 08:41:14 [name], [name] ([id number]) -------------------------------------------------------------------------------- HPI Details Patient Name: [name] Date of Service: 12/21/2021 8:15 AM Medical Record Number: [id number] Patient Account Number: [id number] Date of Birth/Sex: 06/19/1980 (40 y.o. M) Treating RN: [name] Primary Care Provider: [name] Other Clinician: Massie Kluver Referring Provider: [name] Treating Provider/Extender: [name] Cliche in Treatment: 263 History of Present Illness HPI Description: 12/04/16; 41 year old man who comes into the clinic today for review of a wound on the posterior left calf. He tells me that is been there for about a year. He is not a diabetic he does smoke half a pack per day. He was seen in the ER on 11/20/16 felt to have cellulitis around the wound and was given clindamycin. An x-ray did not show osteomyelitis. The patient initially tells me that he has a milk allergy that sets off a pruritic itching rash on his lower legs which she scratches incessantly and he thinks that's what may have set up the wound. He has been using various topical antibiotics and ointments without any effect. He works in a [employer] and is on his feet all day. He does not have a prior history of wounds however he does have the rash on both lower legs the right arm and the ventral aspect of his left arm. These are excoriations and clearly have had scratching however there are of macular looking areas on both legs including a substantial  larger area on the right leg. This does not have an underlying open area. There is no blistering. The patient tells me that 2 years ago in [place] in response to the rash on his legs  he saw a dermatologist who told him he had a condition which may be pyoderma gangrenosum although I may be putting words into his mouth. He seemed to recognize this. On further questioning he admits to a 5 year history of quiesced. ulcerative colitis. He is not in any treatment for this. He's had no recent travel 12/11/16; the patient arrives today with his wound and roughly the same condition we've been using silver alginate this is a deep punched out wound with some surrounding erythema but no tenderness. Biopsy I did did not show confirmed pyoderma gangrenosum suggested nonspecific inflammation and vasculitis but does not provide an actual description of what was seen by the pathologist. I'm really not able to understand this We have also received information from the patient's dermatologist in [place] notes from April 2016. This was a doctor Agarwal-antal. 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 He was prescribed topical steroid high potency under occlusion which helped but at this point the patient did not have a deep punched out wound. 12/18/16; the patient's wound is larger in terms of surface area however this surface looks better and there is less depth. The surrounding erythema also is better. The patient states that the wrap we put on came off 2 days ago when he has been using his compression stockings. He we are in the process of getting a dermatology consult. 12/26/16 on evaluation today patient's left lower extremity wound shows evidence of infection with surrounding erythema noted. He has been tolerating the dressing changes but states that he has noted more discomfort. There is a larger area of erythema surrounding the wound. No fevers, chills, nausea, or vomiting noted at this time. With that  being said the wound still does have slough covering the surface. He is not allergic to any medication that he is aware of at this point. In regard to his right lower extremity he had several regions that are erythematous and pruritic he wonders if there's anything we can do to help that. 01/02/17 I reviewed patient's wound culture which was obtained his visit last week. He was placed on doxycycline at that point. Unfortunately that does not appear to be an antibiotic that would likely help with the situation however the pseudomonas noted on culture is sensitive to Cipro. Also unfortunately patient's wound seems to have a large compared to last week's evaluation. Not severely so but there are definitely increased measurements in general. He is continuing to have discomfort as well he writes this to be a seven out of 10. In fact he would prefer me not to perform any debridement today due to the fact that he is having discomfort and considering he has an active infection on the little reluctant to do so anyway. No fevers, chills, nausea, or vomiting noted at this time. 01/08/17; patient seems dermatology on September 5. I suspect dermatology will want the slides from the biopsy I did sent to their pathologist. I'm not sure if there is a way we can expedite that. In any case the culture I did before I left on vacation 3 weeks ago showed Pseudomonas he was given 10 days of Cipro and per her description of her intake nurses is actually somewhat better this week although the wound is quite a bit bigger than I remember the last time I saw this. He still has 3 more days of Cipro 01/21/17; dermatology appointment tomorrow. He has completed the ciprofloxacin for Pseudomonas. Surface of the wound looks better however he is  had some deterioration in the lesions on his right leg. Meantime the left lateral leg wound we will continue with sample 01/29/17; patient had his dermatology appointment but I can't yet see that  note. He is completed his antibiotics. The wound is more superficial but considerably larger in circumferential area than when he came in. This is in his left lateral calf. He also has swollen erythematous areas with superficial wounds on the right leg and small papular areas on both arms. There apparently areas in her his upper thighs and buttocks I did not look at those. Dermatology biopsied the right leg. Hopefully will have their input next week. 02/05/17; patient went back to see his dermatologist who told him that he had a "scratching problem" as well as staph. He is now on a 30 day course of doxycycline and I believe she gave him triamcinolone cream to the right leg areas to help with the itching [not exactly sure but probably triamcinolone]. She apparently looked at the left lateral leg wound although this was not rebiopsied and I think felt to be ultimately part of the same pathogenesis. He is using sample border foam and changing nevus himself. He now has a new open area on the right posterior leg which was his biopsy site I don't have any of the dermatology notes 02/12/17; we put the patient in compression last week with SANTYL to the wound on the left leg and the biopsy. Edema is much better and the depth of the wound is now at level of skin. Area is still the same oBiopsy site on the right lateral leg we've also been using santyl with a border foam dressing and he is changing this himself. 02/19/17; Using silver alginate started last week to both the substantial left leg wound and the biopsy site on the right wound. He is tolerating compression well. Has a an appointment with his primary M.D. tomorrow wondering about diuretics although I'm wondering if the edema problem is actually lymphedema 02/26/17; the patient has been to see his primary doctor Dr. Jerrel Ivory at [place] our primary care. She started him on Lasix 20 mg and this seems to have helped with the edema. However we are not  making substantial change with the left lateral calf wound and inflammation. The biopsy site on the right leg also looks stable but not really all that different. 03/12/17; the patient has been to see vein and vascular Dr. Lucky Cowboy. He has had venous reflux studies I have not reviewed these. I did get a call from his dermatology office. They felt that he might have pathergy based on their biopsy on his right leg which led them to look at the slides of DESTINY, TRICKEY ([id number]) the biopsy I did on the left leg and they wonder whether this represents pyoderma gangrenosum which was the original supposition in a man with ulcerative colitis albeit inactive for many years. They therefore recommended clobetasol and tetracycline i.e. aggressive treatment for possible pyoderma gangrenosum. 03/26/17; apparently the patient just had reflux studies not an appointment with Dr. dew. She arrives in clinic today having applied clobetasol for 2-3 weeks. He notes over the last 2-3 days excessive drainage having to change the dressing 3-4 times a day and also expanding erythema. He states the expanding erythema seems to come and go and was last this red was earlier in the month.he is on doxycycline 150 mg twice a day as an anti-inflammatory systemic therapy for possible pyoderma gangrenosum along with the topical clobetasol  04/02/17; the patient was seen last week by Dr. Lillia Carmel at [facility] dermatology locally who kindly saw him at my request. 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 My concern was the degree of erythema medially extending from his left leg wound which was either inflammation from pyoderma or cellulitis. I put him on Augmentin however culture of the wound showed Pseudomonas which is quinolone sensitive. I really don't believe he has cellulitis however in view of everything I will continue and give him a course of Cipro. He is also on  doxycycline as an immune modulator for the pyoderma. In addition to his original wound on the left lateral leg with surrounding erythema he has a wound on the right posterior calf which was an original biopsy site done by dermatology. This was felt to represent pathergy from pyoderma gangrenosum 04/16/17; pyoderma gangrenosum. Saw Dr. Lillia Carmel yesterday. He has been using topical antibiotics to both wound areas his original wound on the left and the biopsies/pathergy area on the right. There is definitely some improvement in the inflammation around the wound on the right although the patient states he has increasing sensitivity of the wounds. He is on prednisone 60 and doxycycline 1 as prescribed by Dr. Lillia Carmel. He is covering the topical antibiotic with gauze and putting this in his own compression stocks and changing this daily. He states that Dr. Lottie Rater did a culture of the left leg wound yesterday 05/07/17; pyoderma gangrenosum. The patient saw Dr. Lillia Carmel yesterday and has a follow-up with her in one month. He is still using topical antibiotics to both wounds although he can't recall exactly what type. He is still on prednisone 60 mg. Dr. Lillia Carmel stated that the doxycycline could stop if we were in agreement. 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 The right medial leg was induced by biopsy/pathergy. The area on the right is essentially healed. Still on high-dose prednisone using topical antibiotics to the wound 07/09/17; pyoderma gangrenosum with wounds on the left lateral leg. The right medial leg has closed and remains closed. He is still on prednisone 60. oHe tells me he missed his last dermatology appointment with Dr. Lillia Carmel but will make another appointment. He reports that her blood sugar at a recent screen in [place] was high 200's. He was 180 today. He is more  cushingoid blood pressure is up a bit. I think he is going to require still much longer prednisone perhaps another 3 months before attempting to taper. In the meantime his wound is a lot better. Smaller. He is cleaning this off daily and applying topical antibiotics. When he was last in the clinic I thought about changing to [name] and actually put in a couple of calls to dermatology although probably not during their business hours. In any case the wound looks better smaller I don't think there is any need to change what he is doing 08/06/17-he is here in follow up evaluation for pyoderma left leg ulcer. He continues on oral prednisone. He has been using triple antibiotic ointment. There is surface debris and we will transition to [facility] and have him return in 2 weeks. He has lost 30 pounds since his last appointment with lifestyle modification. He may benefit from topical steroid cream for treatment this can be considered at a later date. 08/22/17 on evaluation today patient appears to actually be doing rather  well in regard to his left lateral lower extremity ulcer. He has actually been managed by Dr. Dellia Nims most recently. Patient is currently on oral steroids at this time. This seems to have been of benefit for him. Nonetheless his last visit was actually with Leah on 08/06/17. Currently he is not utilizing any topical steroid creams although this could be of benefit as well. No fevers, chills, nausea, or vomiting noted at this time. 09/05/17 on evaluation today patient appears to be doing better in regard to his left lateral lower extremity ulcer. He has been tolerating the dressing changes without complication. He is using Santyl with good effect. Overall I'm very pleased with how things are standing at this point. Patient likewise is happy that this is doing better. 09/19/17 on evaluation today patient actually appears to be doing rather well in regard to his left lateral lower extremity ulcer. Again  this is secondary to Pyoderma gangrenosum and he seems to be progressing well with the Santyl which is good news. He's not having any significant pain. 10/03/17 on evaluation today patient appears to be doing excellent in regard to his lower extremity wound on the left secondary to Pyoderma gangrenosum. He has been tolerating the Santyl without complication and in general I feel like he's making good progress. 10/17/17 on evaluation today patient appears to be doing very well in regard to his left lateral lower surety ulcer. He has been tolerating the dressing changes without complication. There does not appear to be any evidence of infection he's alternating the Santyl and the triple antibiotic ointment every other day this seems to be doing well for him. 11/03/17 on evaluation today patient appears to be doing very well in regard to his left lateral lower extremity ulcer. He is been tolerating the dressing changes without complication which is good news. Fortunately there does not appear to be any evidence of infection which is also great news. Overall is doing excellent they are starting to taper down on the prednisone is down to 40 mg at this point it also started topical clobetasol for him. 11/17/17 on evaluation today patient appears to be doing well in regard to his left lateral lower surety ulcer. He's been tolerating the dressing changes without complication. He does note that he is having no pain, no excessive drainage or discharge, and overall he feels like things are going about how he would expect and hope they would. Overall he seems to have no evidence of infection at this time in my opinion which is good news. 12/04/17-He is seen in follow-up evaluation for right lateral lower extremity ulcer. He has been applying topical steroid cream. Today's measurement show slight increase in size. Over the next 2 weeks we will transition to every other day Santyl and steroid cream. He has  been encouraged to monitor for changes and notify clinic with any concerns 12/15/17 on evaluation today patient's left lateral motion the ulcer and fortunately is doing worse again at this point. This just since last week to this week has close to doubled in size according to the patient. I did not seeing last week's I do not have a visual to compare this to in our system was also down so we do not have all the charts and at this point. Nonetheless it does have me somewhat concerned in regard to the fact that again he was worried enough about it he has contact the dermatology that placed them back on the full strength, 50 mg a day  of the prednisone that he was taken previous. He continues to alternate using clobetasol along with Santyl at this point. He is obviously somewhat frustrated. 12/22/17 on evaluation today patient appears to be doing a little worse compared to last evaluation. Unfortunately the wound is a little deeper and slightly larger than the last week's evaluation. With that being said he has made some progress in regard to the irritation surrounding at this time unfortunately despite that progress that's been made he still has a significant issue going on here. 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 [name], [name] ([id number]) For that reason I discussed with him today the possibility of perform a wound culture to ensure there's nothing overtly infected. 01/06/18 on evaluation today patient's wound is larger and deeper than previously evaluated. With that being said it did appear that his wound was infected after my last evaluation with him. Subsequently I did end up prescribing a prescription for Bactrim DS which she has been taking and having no complication with. Fortunately there does not appear to be any evidence of infection at this  point in time as far as anything spreading, no want to touch, and overall I feel like things are showing signs of improvement. 01/13/18 on evaluation today patient appears to be even a little larger and deeper than last time. There still muscle exposed in the base of the wound. Nonetheless he does appear to be less erythematous I do believe inflammation is calming down also believe the infection looks like it's probably resolved at this time based on what I'm seeing. No fevers, chills, nausea, or vomiting noted at this time. 01/30/18 on evaluation today patient actually appears to visually look better for the most part. Unfortunately those visually this looks better he does seem to potentially have what may be an abscess in the muscle that has been noted in the central portion of the wound. This is the first time that I have noted what appears to be fluctuance in the central portion of the muscle. With that being said I'm somewhat more concerned about the fact that this might indicate an abscess formation at this location. I do believe that an ultrasound would be appropriate. This is likely something we need to try to do as soon as possible. He has been switch to mupirocin ointment and he is no longer using the steroid ointment as prescribed by dermatology he sees them again next week he's been decreased from 60 to 40 mg of prednisone. 03/09/18 on evaluation today patient actually appears to be doing a little better compared to last time I saw him. There's not as much erythema surrounding the wound itself. He I did review his most recent infectious disease note which was dated 02/24/18. He saw Dr. Michel Bickers in [place]. 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 Patient is now off of antibiotics and they are continually observing this. There seems to be no change in the past two weeks in my pinion based on what  the patient says and what I see today compared to what Dr. Megan Salon likely saw two weeks ago. No fevers, chills, nausea, or vomiting noted at this time. 03/23/18 on evaluation today patient's wound actually appears to be showing signs of improvement which is good news.  He is currently still on the Dapsone. He is also working on tapering the prednisone to get off of this and Dr. Lottie Rater is working with him in this regard. Nonetheless overall I feel like the wound is doing well it does appear based on the infectious disease note that I reviewed from Dr. 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 I did also review the results of his ultrasound of left lower extremity which revealed there was a dentist tissue in the base of the wound without an abscess noted. 04/06/18 on evaluation today the patient's left lateral lower extremity ulcer actually appears to be doing fairly well which is excellent news. There does not appear to be any evidence of infection at this time which is also great news. Overall he still does have a significantly large ulceration although little by little he seems to be making progress. He is down to 10 mg a day of the prednisone. 04/20/18 on evaluation today patient actually appears to be doing excellent at this time in regard to his left lower extremity ulcer. He's making signs of good progress unfortunately this is taking much longer than we would really like to see but nonetheless he is making progress. Fortunately there does not appear to be any evidence of infection at this time. No fevers, chills, nausea, or vomiting noted at this time. The patient has not been using the Santyl due to the cost he hadn't got in this field yet. He's mainly been using the antibiotic ointment topically. Subsequently he also tells me that he really has not been scrubbing in the shower I  think this would be helpful again as I told him it doesn't have to be anything too aggressive to even make it believe just enough to keep it free of some of the loose slough and biofilm on the wound surface. 05/11/18 on evaluation today patient's wound appears to be making slow but sure progress in regard to the left lateral lower extremity ulcer. He is been tolerating the dressing changes without complication. Fortunately there does not appear to be any evidence of infection at this time. He is still just using triple antibiotic ointment along with clobetasol occasionally over the area. He never got the Santyl and really does not seem to intend to in my pinion. 06/01/18 on evaluation today patient appears to be doing a little better in regard to his left lateral lower extremity ulcer. He states that overall he does not feel like he is doing as well with the Dapsone as he did with the prednisone. Nonetheless he sees his dermatologist later today and is gonna talk to them about the possibility of going back on the prednisone. Overall again I believe that the wound would be better if you would utilize Santyl but he really does not seem to be interested in going back to the [place] at this point. He has been using triple antibiotic ointment. 06/15/18 on evaluation today patient's wound actually appears to be doing about the same at this point. Fortunately there is no signs of infection at this time. He has made slight improvements although he continues to not really want to clean the wound bed at this point. He states that he just doesn't mess with it he doesn't want to cause any problems with everything else he has going on. He has been on medication, antibiotics as prescribed by  his dermatologist, for a staff infection of his lower extremities which is really drying out now and looking much better he tells me. Fortunately there is no sign of overall infection. 06/29/18 on evaluation today patient appears  to be doing well in regard to his left lateral lower surety ulcer all things considering. Fortunately his staff infection seems to be greatly improved compared to previous. He has no signs of infection and this is drying up quite nicely. He is still the doxycycline for this is no longer on cental, Dapsone, or any of the other medications. His dermatologist has recommended possibility of an infusion but right now he does not want to proceed with that. 07/13/18 on evaluation today patient appears to be doing about the same in regard to his left lateral lower surety ulcer. Fortunately there's no signs of infection at this time which is great news. Unfortunately he still builds up a significant amount of Slough/biofilm of the surface of the wound he still is not really cleaning this as he should be appropriately. Again I'm able to easily with saline and gauze remove the majority of this on the surface which if you would do this at home would likely be a dramatic improvement for him as far as getting the area to improve. Nonetheless overall I still feel like he is making progress is just very slow. I think Santyl will be of benefit for him as well. Still he has not gotten this as of this point. 07/27/18 on evaluation today patient actually appears to be doing little worse in regards of the erythema around the periwound region of the wound he also tells me that he's been having more drainage currently compared to what he was experiencing last time I saw him. He states not quite as bad as what he had because this was infected previously but nonetheless is still appears to be doing poorly. Fortunately there is no evidence of systemic infection at this point. The patient tells me that he is not going to be able to afford the Santyl. He is still waiting to hear about the infusion therapy with his dermatologist. Apparently she wants an updated colonoscopy first. 08/10/18 on evaluation today patient appears to be  doing better in regard to his left lateral lower extremity ulcer. Fortunately he is showing signs of improvement in this regard he's actually been approved for Remicade infusion's as well although this has not been scheduled as of yet. Fortunately there's no signs of active infection at this time in regard to the wound although he is having some issues with infection of the right lower extremity is been seen as dermatologist for this. Fortunately they are definitely still working with him trying to keep things under control. [name], [name] ([id number]) 09/07/18 on evaluation today patient is actually doing rather well in regard to his left lateral lower extremity ulcer. He notes these actually having some hair grow back on his extremity which is something he has not seen in years. He also tells me that the pain is really not giving them any trouble at this time which is also good news overall she is very pleased with the progress he's using a combination of the mupirocin along with the probate is all mixed. 09/21/18 on evaluation today patient actually appears to be doing fairly well all things considered in regard to his looks from the ulcer. He's been tolerating the dressing changes without complication. Fortunately there's no signs of active infection at this time which is good  news he is still on all antibiotics or prevention of the staff infection. He has been on prednisone for time although he states it is gonna contact his dermatologist and see if she put them on a short course due to some irritation that he has going on currently. Fortunately there's no evidence of any overall worsening this is going very slow I think cental would be something that would be helpful for him although he states that $50 for tube is quite expensive. He therefore is not willing to get that at this point. 10/06/18 on evaluation today patient actually appears to be doing decently well in regard to his left lateral leg  ulcer. He's been tolerating the dressing changes without complication. Fortunately there's no signs of active infection at this time. Overall I'm actually rather pleased with the progress he's making although it's slow he doesn't show any signs of infection and he does seem to be making some improvement. I do believe that he may need a switch up and dressings to try to help this to heal more appropriately and quickly. 10/19/18 on evaluation today patient actually appears to be doing better in regard to his left lateral lower extremity ulcer. This is shown signs of having much less Slough buildup at this point due to the fact he has been using the [employer]. Obviously this is very good news. The overall size of the wound is not dramatically smaller but again the appearance is. 11/02/18 on evaluation today patient actually appears to be doing quite well in regard to his lower [place] ulcer. A lot of the skin around the ulcer is actually somewhat irritating at this point this seems to be more due to the dressing causing irritation from the adhesive that anything else. Fortunately there is no signs of active infection at this time. 11/24/18 on evaluation today patient appears to be doing a little worse in regard to his overall appearance of his lower extremity ulcer. There's more erythema and warmth around the wound unfortunately. He is currently on doxycycline which he has been on for some time. With that being said I'm not sure that seems to be helping with what appears to possibly be an acute cellulitis with regard to his left lower extremity ulcer. No fevers, chills, nausea, or vomiting noted at this time. 12/08/18 on evaluation today patient's wounds actually appears to be doing significantly better compared to his last evaluation. He has been using Santyl along with alternating tripling about appointment as well as the steroid cream seems to be doing quite well and the wound is showing signs of improvement  which is excellent news. Fortunately there's no evidence of infection and in fact his culture came back negative with only normal skin flora noted. 12/21/2018 upon evaluation today patient actually appears to be doing excellent with regard to his ulcer. This is actually the best that I have seen it since have been helping to take care of him. It is both smaller as well as less slough noted on the surface of the wound and seems to be showing signs of good improvement with new skin growing from the edges. He has been using just the triamcinolone he does wonder if he can get a refill of that ointment today. 01/04/2019 upon evaluation today patient actually appears to be doing well with regard to his left lateral lower extremity ulcer. With that being said it does not appear to be that he is doing quite as well as last time as far as  progression is concerned. There does not appear to be any signs of infection or significant irritation which is good news. With that being said I do believe that he may benefit from switching to a collagen based dressing based on how clean The wound appears. 01/18/2019 on evaluation today patient actually appears to be doing well with regard to his wound on the left lower extremity. He is not made a lot of progress compared to where we were previous but nonetheless does seem to be doing okay at this time which is good news. There is no signs of active infection which is also good news. My only concern currently is I do wish we can get him into utilizing the collagen dressing his insurance would not pay for the supplies that we ordered although it appears that he may be able to order this through his supply company that he typically utilizes. This is [employer]. Nonetheless he did try to order it during the office visit today and it appears this did go through. We will see if he can get that it is a different brand but nonetheless he has collagen and I do think will be  beneficial. 02/01/2019 on evaluation today patient actually appears to be doing a little worse today in regard to the overall size of his wounds. Fortunately there is no signs of active infection at this time. That is visually. Nonetheless when this is happened before it was due to infection. For that reason were somewhat concerned about that this time as well. 02/08/2019 on evaluation today patient unfortunately appears to be doing slightly worse with regard to his wound upon evaluation today. Is measuring a little deeper and a little larger unfortunately. I am not really sure exactly what is causing this to enlarge he actually did see his dermatologist she is going to see about initiating Humira for him. Subsequently she also did do steroid injections into the wound itself in the periphery. Nonetheless still nonetheless he seems to be getting a little bit larger he is gone back to just using the steroid cream topically which I think is appropriate. I would say hold off on the collagen for the time being is definitely a good thing to do. Based on the culture results which we finally did get the final result back regarding it shows staph as the bacteria noted again that can be a normal skin bacteria based on the fact however he is having increased drainage and worsening of the wound measurement wise I would go ahead and place him on an antibiotic today I do believe for this. 02/15/2019 on evaluation today patient actually appears to be doing somewhat better in regard to his ulcer. There is no signs of worsening at this time I did review his culture results which showed evidence of Staphylococcus aureus but not MRSA. Again this could just be more related to the normal skin bacteria although he states the drainage has slowed down quite a bit he may have had a mild infection not just colonization. And was much smaller and then since around10/04/2019 on evaluation today patient appears to be doing  unfortunately worse as far as the size of the wound. I really feel like that this is steadily getting larger again it had been doing excellent right at the beginning of September we have seen a steady increase in the area of the wound it is almost 2-1/2 times the size it was on September 1. Obviously this is a bad trend this is not  wanting to see. For that reason we went back to using just the topical triamcinolone cream which does seem to help with inflammation. I checked him for bacteria by way of culture and nothing showed positive there. I am considering giving him a short course of a tapering steroid Dosepak today to see if that is can be beneficial for him. The patient is in agreement with giving that a try. 03/08/2019 on evaluation today patient appears to be doing very well in comparison to last evaluation with regard to his lower extremity ulcer. This is showing signs of less inflammation and actually measuring slightly smaller compared to last time every other week over the past month and a half he has been measuring larger larger larger. Nonetheless I do believe that the issue has been inflammation the prednisone does seem to [facility], Coda ([id number]) have been beneficial for him which is good news. No fevers, chills, nausea, vomiting, or diarrhea. 03/22/2019 on evaluation today patient appears to be doing about the same with regard to his leg ulcer. He has been tolerating the dressing changes without complication. With that being said the wound seems to be mostly arrested at its current size but really is not making any progress except for when we prescribed the prednisone. He did show some signs of dropping as far as the overall size of the wound during that interval week. Nonetheless this is something he is not on long-term at this point and unfortunately I think he is getting need either this or else the Humira which his dermatologist has discussed try to get approval for. With that  being said he will be seeing his dermatologist on the 11th of this month that is November. 04/19/2019 on evaluation today patient appears to be doing really about the same the wound is measuring slightly larger compared to last time I saw him. He has not been into the office since November 2 due to the fact that he unfortunately had Covid as that his entire family. He tells me that it was rough but they did pull-through and he seems to be doing much better. Fortunately there is no signs of active infection at this time. No fevers, chills, nausea, vomiting, or diarrhea. 05/10/2019 on evaluation today patient unfortunately appears to be doing significantly worse as compared to last time I saw him. He does tell me that he has had his first dose of Humira and actually is scheduled to get the next one in the upcoming week. With that being said he tells me also that in the past several days he has been having a lot of issues with green drainage she showed me a picture this is more blue-green in color. He is also been having issues with increased sloughy buildup and the wound does appear to be larger today. Obviously this is not the direction that we want everything to take based on the starting of his Humira. Nonetheless I think this is definitely a result of likely infection and to be honest I think this is probably Pseudomonas causing the infection based on what I am seeing. 05/24/2019 on evaluation today patient unfortunately appears to be doing significantly worse compared to his prior evaluation with me 2 weeks ago. I did review his culture results which showed that he does have Staph aureus as well as Pseudomonas noted on the culture. Nonetheless the Levaquin that I prescribed for him does not appear to have been appropriate and in fact he tells me he is no longer experiencing  the green drainage and discharge that he had at the last visit. Fortunately there is no signs of active infection at this time  which is good news although the wound has significantly worsened it in fact is much deeper than it was previous. We have been utilizing up to this point triamcinolone ointment as the prescription topical of choice but at this time I really feel like that the wound is getting need to be packed in order to appropriately manage this due to the deeper nature of the wound. Therefore something along the lines of an alginate dressing may be more appropriate. 05/31/2019 upon inspection today patient's wound actually showed signs of doing poorly at this point. Unfortunately he just does not seem to be making any good progress despite what we have tried. He actually did go ahead and pick up the Cipro and start taking that as he was noticing more green drainage he had previously completed the Levaquin that I prescribed for him as well. Nonetheless he missed his appointment for the seventh last week on Wednesday with the wound care center and [facility] where his dermatologist referred him. Obviously I do think a second opinion would be helpful at this point especially in light of the fact that the patient seems to be doing so poorly despite the fact that we have tried everything that I really know how at this point. The only thing that ever seems to have helped him in the past is when he was on high doses of continual steroids that did seem to make a difference for him. 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 06/07/2019 upon evaluation today patient unfortunately appears to be doing worse yet again with regard to his wound. In fact I am starting to question whether or not he may have a fluid pocket in the muscle at this point based on the bulging and the soft appearance to the central portion of the muscle area. There is not anything draining from the  muscle itself at this time which is good news but nonetheless the wound is expanding. I am not really seeing any results of the Humira as far as overall wound progression based on what I am seeing at this point. The patient has been referred for second opinion with regard to his wound to the [facility] wound care center by his dermatologist which I definitely am not in opposition to. Unfortunately we tried multiple dressings in the past including collagen, alginate, and at one point even Hydrofera Blue. With that being said he is never really used it for any significant amount of time due to the fact that he often complains of pain associated with these dressings and then will go back to either using the Santyl which she has done intermittently or more frequently the triamcinolone. He is also using his own compression stockings. We have wrapped him in the past but again that was something else that he really was not a big fan of. 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 He seemed to do much better when he was on the steroids although honestly I understand that the steroids are not necessarily the best medication to be on long-term obviously 06/14/2019 on evaluation today patient appears to be doing actually a little bit better with regard to the overall appearance with his leg. Unfortunately he does continue to have issues with what appears to be some fluid underneath the muscle although he did see the wound specialty center at [facility] last week their main goals were to see about infusion therapy in place of the Humira as they feel like that is not quite strong enough. They also recommended  that we continue with the treatment otherwise as we are they felt like that was appropriate and they are okay with him continuing to follow-up here with [place] in that regard. With that being said they are also sending him to the vein specialist there to see about vein stripping and if that would be of benefit for him. Subsequently they also did not really address whether or not an ultrasound of the muscle area to see if there is anything that needs to be addressed here would be appropriate or not. For that reason I discussed this with him last week I think we may proceed down that road at this point. 06/21/2019 upon evaluation today patient's wound actually appears to be doing slightly better compared to previous evaluations. I do believe that he has made a difference with regard to the progression here with the use of oral steroids. Again in the past has been the only thing that is really calm things down. He does tell me that from [facility] is gotten a good news from there that there are no further vein stripping that is necessary at this point. I do not have that available for review today although the patient did relay this to me. He also did obtain and have the ultrasound of the wound completed which I did sign off on today. It does appear that there is no fluid collection under the muscle this is likely then just edematous tissue in general. That is also good news. Overall I still believe the inflammation is the main issue here. He did inquire about the possibility of a wound VAC again with the muscle protruding like it is I am not really sure whether the wound VAC is necessarily ideal or not. That is something we will have to consider although I do believe he may need compression wrapping to try to help with edema control which could potentially be of benefit. 06/28/2019 on evaluation today patient appears to be doing slightly better measurement wise although this is not terribly smaller he least seems  to be trending towards that direction. With that being said he still seems to have purulent drainage noted in the wound bed at this time. He has been on Levaquin followed by Cipro over the past month. Unfortunately he still seems to have some issues with active infection at this time. I did perform a culture last week in order to evaluate and see if indeed there was still anything going on. Subsequently the culture did come back showing Pseudomonas which is consistent with the drainage has been having which is blue-green in color. He also has had an odor that again was somewhat consistent with Pseudomonas as well. Long story short it appears that the culture showed an intermediate finding with regard to how well the  Cipro will work for the Pseudomonas infection. Subsequently being that he does not seem to be clearing up and at best what we are doing is just keeping this at [place] I think he may need to see infectious disease to discuss IV antibiotic options. NATHANEAL, SOMMERS ([id number]) 07/05/2019 upon evaluation today patient appears to be doing okay in regard to his leg ulcer. He has been tolerating the dressing changes at this point without complication. Fortunately there is no signs of active infection at this time which is good news. No fevers, chills, nausea, vomiting, or diarrhea. With that being said he does have an appointment with infectious disease tomorrow and his primary care on Wednesday. Again the reason for the infectious disease referral was due to the fact that he did not seem to be fully resolving with the use of oral antibiotics and therefore we were thinking that IV antibiotic therapy may be necessary secondary to the fact that there was an intermediate finding for how effective the Cipro may be. Nonetheless again he has been having a lot of purulent and even green drainage. Fortunately right now that seems to have calmed down over the past week with the reinitiation of the oral  antibiotic. Nonetheless we will see what Dr. Megan Salon has to say. 07/12/2019 upon evaluation today patient appears to be doing about the same at this point in regard to his left lower extremity ulcer. Fortunately there is no signs of active infection at this time which is good news I do believe the Levaquin has been beneficial I did review Dr. Hale Bogus note and to be honest I agree that the patient's leg does appear to be doing better currently. What we found in the past as he does not seem to really completely resolve he will stop the antibiotic and then subsequently things will revert back to having issues with blue-green drainage, increased pain, and overall worsening in general. Obviously that is the reason I sent him back to infectious disease. 07/19/2019 upon evaluation today patient appears to be doing roughly the same in size there is really no dramatic improvement. He has started back on the Levaquin at this point and though he seems to be doing okay he did still have a lot of blue/green drainage noted on evaluation today unfortunately. I think that this is still indicative more likely of a Pseudomonas infection as previously noted and again he does see Dr. Megan Salon in just a couple of days. I do not know that were really able to effectively clear this with just oral antibiotics alone based on what I am seeing currently. Nonetheless we are still continue to try to manage as best we can with regard to the patient and his wound. I do think the wrap was helpful in decreasing the edema which is excellent news. No fevers, chills, nausea, vomiting, or diarrhea. 07/26/2019 upon evaluation today patient appears to be doing slightly better with regard to the overall appearance of the muscle there is no dark discoloration centrally. Fortunately there is no signs of active infection at this time. No fevers, chills, nausea, vomiting, or diarrhea. Patient's wound bed currently the patient did have an  appointment with Dr. Megan Salon at [facility] last week. With that being said Dr. Megan Salon the patient states was still somewhat hesitant about put him on any IV antibiotics he wanted [place] to repeat cultures today and then see where things go going forward. He does look like Dr. Megan Salon because of some improvement the patient did have  with the Levaquin wanted [place] to see about repeating cultures. If it indeed grows the Pseudomonas again then he recommended a possibility of considering a PICC line placement and IV antibiotic therapy. He plans to see the patient back in 1 to 2 weeks. 08/02/2019 upon evaluation today patient appears to be doing poorly with regard to his left lower extremity. We did get the results of his culture back it shows that he is still showing evidence of Pseudomonas which is consistent with the purulent/blue-green drainage that he has currently. Subsequently the culture also shows that he now is showing resistance to the oral fluoroquinolones which is unfortunate as that was really the only thing to treat the infection prior. I do believe that he is looking like this is going require IV antibiotic therapy to get this under control. Fortunately there is no signs of systemic infection at this time which is good news. The patient does see Dr. Megan Salon tomorrow. 08/09/2019 upon evaluation today patient appears to be doing better with regard to his left lower extremity ulcer in regard to the overall appearance. He is currently on IV antibiotic therapy. As ordered by Dr. Megan Salon. Currently the patient is on ceftazidime which she is going to take for the next 2 weeks and then follow-up for 4 to 5-week appointment with Dr. Megan Salon. The patient started this this past Friday symptoms have not for a total of 3 days currently in full. 08/16/2019 upon evaluation today patient's wound actually does show muscle in the base of the wound but in general does appear to be much better as far as  the overall evidence of infection is concerned. In fact I feel like this is for the most part cleared up he still on the IV antibiotics he has not completed the full course yet but I think he is doing much better which is excellent news. 08/23/2019 upon evaluation today patient appears to be doing about the same with regard to his wound at this point. He tells me that he still has pain unfortunately. Fortunately there is no evidence of systemic infection at this time which is great news. There is significant muscle protrusion. 09/13/19 upon evaluation today patient appears to be doing about the same in regard to his leg unfortunately. He still has a lot of drainage coming from the ulceration there is still muscle exposed. With that being said the patient's last wound culture still showed an intermediate finding with regard to the Pseudomonas he still having the bluish/green drainage as well. Overall I do not know that the wound has completely cleared of infection at this point. Fortunately there is no signs of active infection systemically at this point which is good news. 09/20/2019 upon evaluation today patient's wound actually appears to be doing about the same based on what I am seeing currently. I do not see any signs of systemic infection he still does have evidence of some local infection and drainage. He did see Dr. Megan Salon last week and Dr. Megan Salon states that he probably does need a different IV antibiotic although he does not want to put him on this until the patient begins the Remicade infusion which is actually scheduled for about 10 days out from today on 13 May. Following that time Dr. Megan Salon is good to see him back and then will evaluate the feasibility of starting him on the IV antibiotic therapy once again at that point. I do not disagree with this plan I do believe as Dr. Megan Salon stated in  his note that I reviewed today that the patient's issue is multifactorial with the pyoderma  being 1 aspect of this that were hoping the Remicade will be helpful for her. In the meantime I think the gentamicin is, helping to keep things under decent okay control in regard to the ulcer. 09/27/2019 upon evaluation today patient appears to be doing about the same with regard to his wound still there is a lot of muscle exposure though he does have some hyper granulation tissue noted around the edge and actually some granulation tissue starting to form over the muscle which is actually good news. Fortunately there is no evidence of active infection which is also good news. His pain is less at this point. 5/21; this is a patient I have not seen in a long time. 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 He has a large wound on the left lateral leg with protruding muscle. He comes in the clinic today showing the same area on his left medial ankle. He says there is been a spot there for some time although we have not previously defined this. Today he has a clearly defined area with slight amount of skin breakdown surrounded by raised areas with a purplish hue in color. This is not painful he says it is irritated. 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 He still has muscle protruding from the lateral portion of his left leg but fortunately the new area that they were concerned about at his last visit does not appear to have opened at this point. He is currently on Remicade infusions and seems to be doing better in my opinion in fact the wound itself seems to be overall much better. The purplish discoloration that he did have seems to have resolved and I think that is a good sign that hopefully the Remicade is doing its job. He does have some biofilm noted over the surface of the wound. 11/01/2019 on evaluation today patient's wound actually appears to be  doing excellent at this time. Fortunately there is no evidence of active infection and overall I feel like he is making great progress. The Remicade seems to be due excellent job in my opinion. OLLIVANDER, SEE ([id number]) 11/08/19 evaluation today vision actually appears to be doing quite well with regard to his weight ulcer. He's been tolerating dressing changes without complication. Fortunately there is no evidence of infection. No fevers, chills, nausea, or vomiting noted at this time. Overall states that is having more itching than pain which is actually a good sign in my opinion. 12/13/2019 upon evaluation today patient appears to be doing well today with regard to his wound. He has been tolerating the dressing changes without complication. Fortunately there is no sign of active infection at this time. No fevers, chills, nausea, vomiting, or diarrhea. Overall I feel like the infusion therapy has been very beneficial for him. 01/06/2020 on evaluation today patient appears to be doing well with regard to his wound. This is measuring smaller and actually looks to be doing better. Fortunately there is no signs of active infection at this point. No fevers, chills, nausea, vomiting, or diarrhea. With that being said he does still have the blue-green drainage but this does not seem to be causing any significant issues currently. He has been using the gentamicin that does seem to be keeping things under decent control at this point. He  goes later this morning for his next infusion therapy for the pyoderma which seems to also be very beneficial. 02/07/2020 on evaluation today patient appears to be doing about the same in regard to his wounds currently. Fortunately there is no signs of active infection systemically he does still have evidence of local infection still using gentamicin. He also is showing some signs of improvement albeit slowly I do feel like we are making some progress here. 02/21/2020 upon  evaluation today patient appears to be making some signs of improvement the wound is measuring a little bit smaller which is great news and overall I am very pleased with where he stands currently. He is going to be having infusion therapy treatment on the 15th of this month. Fortunately there is no signs of active infection at this time. 03/13/2020 I do believe patient's wound is actually showing some signs of improvement here which is great news. He has continue with the infusion therapy through rheumatology/dermatology at [name]. That does seem to be beneficial. I still think he gets as much benefit from this as he did from the prednisone initially but nonetheless obviously this is less harsh on his body that the prednisone as far as they are concerned. 03/31/2020 on evaluation today patient's wound actually showing signs of some pretty good improvement in regard to the overall appearance of the wound bed. There is still muscle exposed though he does have some epithelial growth around the edges of the wound. Fortunately there is no signs of active infection at this time. No fevers, chills, nausea, vomiting, or diarrhea. 04/24/2020 upon evaluation today patient appears to be doing about the same in regard to his leg ulcer. He has been tolerating the dressing changes without complication. Fortunately there is no signs of active infection at this time. No fevers, chills, nausea, vomiting, or diarrhea. With that being said he still has a lot of irritation from the bandaging around the edges of the wound. We did discuss today the possibility of a referral to plastic surgery. 05/22/2020 on evaluation today patient appears to be doing well with regard to his wounds all things considered. He has not been able to get the Chantix apparently there is a recall nurse that I was unaware of put out by [employer] involuntarily. Nonetheless for now I am and I have to do some research into what may be the best option for him  to help with quitting in regard to smoking and we discussed that today. 06/26/2020 upon evaluation today patient appears to be doing well with regard to his wound from the standpoint of infection I do not see any signs of infection at this point. With that being said unfortunately he is still continuing to have issues with muscle exposure and again he is not having a whole lot of new skin growth unfortunately. There does not appear to be any signs of active infection at this time. No fevers, chills, nausea, vomiting, or diarrhea. 07/10/2020 upon evaluation today patient appears to be doing a little bit more poorly currently compared to where he was previous. I am concerned currently about an active infection that may be getting worse especially in light of the increased size and tenderness of the wound bed. No fevers, chills, nausea, vomiting, or diarrhea. 07/24/2020 upon evaluation today patient appears to be doing poorly in regard to his leg ulcer. He has been tolerating the dressing changes without complication but unfortunately is having a lot of discomfort. Unfortunately the patient has an infection  with Pseudomonas resistant to gentamicin as well as fluoroquinolones. Subsequently I think he is going require possibly IV antibiotics to get this under control. I am very concerned about the severity of his infection and the amount of discomfort he is having. 07/31/2020 upon evaluation today patient appears to be doing about the same in regard to his leg wound. He did see Dr. Megan Salon and Dr. Megan Salon is actually going to start him on IV antibiotics. He goes for the PICC line tomorrow. With that being said there do not have that run for 2 weeks and then see how things are doing and depending on how he is progressing they may extend that a little longer. Nonetheless I am glad this is getting ready to be in place and definitely feel it may help the patient. In the meantime is been using mainly triamcinolone  to the wound bed has an anti-inflammatory. 08/07/2020 on evaluation today patient appears to be doing well with regard to his wound compared even last week. In the interim he has gotten the PICC line placed and overall this seems to be doing excellent. There does not appear to be any evidence of infection which is great news systemically although locally of course has had the infection this appears to be improving with the use of the antibiotics. 08/14/2020 upon evaluation today patient's wound actually showing signs of excellent improvement. Overall the irritation has significantly improved the drainage is back down to more of a normal level and his pain is really pretty much nonexistent compared to what it was. Obviously I think that this is significantly improved secondary to the IV antibiotic therapy which has made all the difference in the world. Again he had a resistant form of Pseudomonas for which oral antibiotics just was not cutting it. Nonetheless I do think that still we need to consider the possibility of a surgical closure for this wound is been open so long and to be honest with muscle exposed I think this can be very hard to get this to close outside of this although definitely were still working to try to do what we can in that regard. 08/21/2020 upon evaluation today patient appears to be doing very well with regard to his wounds on the left lateral lower extremity/calf area. Fortunately there does not appear to be signs of active infection which is great news and overall very pleased with where things stand today. He is actually wrapping up his treatment with IV antibiotics tomorrow. After that we will see where things go from there. 08/28/2020 upon evaluation today patient appears to be doing decently well with regard to his leg ulcer. There does not appear to be any signs of active infection which is great news and overall very pleased with where things stand today. No fevers, chills,  nausea, vomiting, or diarrhea. 09/18/2020 upon evaluation today patient appears to be doing well with regard to his infection which I feel like is better. Unfortunately he is not doing as well with regard to the overall size of the wound which is not nearly as good at this point. I feel like that he may be having an issue here with the pyoderma being somewhat out of control. I think that he may benefit from potentially going back and talking to the dermatologist about KALMAN, NYLEN ([id number]) what to do from the pyoderma standpoint. I am not certain if the infusions are helping nearly as much is what the prednisone did in the past. 10/02/2020 upon evaluation today  patient appears to be doing well with regard to his leg ulcer. He did go to the [facility] nurse. Unfortunately they feel like there is a 10% chance that most that he would be able to heal and that the skin graft would take. Obviously this has led him to not be able to go down that path as far as treatment is concerned. Nonetheless he does seem to be doing a little bit better with the prednisone that I gave him last time. I think that he may need to discuss with dermatology the possibility of long-term prednisone as that seems to be what is most helpful for him to be perfectly honest. I am not sure the Remicade is really doing the job. 10/17/2020 upon evaluation today patient appears to be doing a little better in regard to his wound. In fact the case has been since we did the prednisone on May 2 for him that we have noticed a little bit of improvement each time we have seen a size wise as well as appearance wise as well as pain wise. I think the prednisone has had a greater effect then the infusion therapy has to be perfectly honest. With that being said the patient also feels significantly better compared to what he was previous. All of this is good news but nonetheless I am still concerned about the fact that again we are really not set up to  long-term manage him as far as prednisone is concerned. Obviously there are things that you need to be watched I completely understand the risk of prednisone usage as well. 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 With all that being said I do believe that we can give him another round of the prednisone which she is requesting today because of the improvement that he seen since we did that first round. 10/30/2020 upon evaluation today patient's wound actually is showing signs of doing quite well. There does not appear to be any evidence of infection which is great news and overall very pleased with where things stand today. No fevers, chills, nausea, vomiting, or diarrhea. He tells me that the prednisone still has seem to have helped he wonders if we can extend that for just a little bit longer. He did not have the appointment with a dermatologist although he did have an infusion appointment last Friday. That was at [facility]. With that being said he tells me he could not do both that as well as the appointment with the physician on the same day therefore that is can have to be rescheduled. I really want to see if there is anything they feel like that could be done differently to try to help this out as I am not really certain that the infusions are helping significantly here. 11/13/2020 upon evaluation today patient unfortunately appears to be doing somewhat poorly in regard to his wound I feel like this is actually worsening from the standpoint of the pyoderma spreading. I still feel like that he may need something different as far as trying to manage this going forward. Again we did the prednisone unfortunately his blood sugars are not doing so well because of this. Nonetheless I believe that the patient likely needs to try topical steroid. We have done triamcinolone for a while I think going with something stronger such as clobetasol could be beneficial  again this is not something I do lightly I discussed this with the patient that  again this does not normally put underneath an occlusive dressing. Nonetheless I think a thin film as such could help with some of the stronger anti-inflammatory effects. We discussed this today. He would like to try to give this a trial for the next couple weeks. I definitely think that is something that we can do. Evaluate7/03/2021 and today patient's wound bed actually showed signs of doing really about the same. There was a little expansion of the size of the wound and that leading edge that we done looking out although the clobetasol does seem to have slowed this down a bit in my opinion. There is just 1 small area that still seems to be progressing based on what I see. Nonetheless I am concerned about the fact this does not seem to be improving if anything seems to be doing a little bit worse. I do not know that the infusions are really helping him much as next infusion is August 5 his appointment with dermatology is July 25. Either way I really think that we need to have a conversation potentially about this and I am actually going to see if I can talk with Dr. Lillia Carmel in order to see where things stand as well. 12/11/2020 upon evaluation today patient appears to be doing worse in regard to his leg ulcer. Unfortunately I just do not think this is making the progress that I would like to see at this point. Honestly he does have an appointment with dermatology and this is in 2 days. I am wondering what they may have to offer to help with this. Right now what I am seeing is that he is continuing to show signs of worsening little by little. Obviously that is not great at all. Is the exact opposite of what we are looking for. 12/18/2020 upon evaluation today patient appears to be doing a little better in regard to his wound. The dermatologist actually did do some steroid injections into the wound which does seem to have  been beneficial in my opinion. That was on the 25th already this looks a little better to me than last time I saw him. With that being said we did do a culture and this did show that he has Staph aureus noted in abundance in the wound. With that being said I do think that getting him on an oral antibiotic would be appropriate as well. Also think we can compression wrap and this will make a difference as well. 12/28/2020 upon evaluation today patient's wound is actually showing signs of doing much better. I do believe the compression wrap is helping he has a lot of drainage but to be honest I think that the compression is helping to some degree in this regard as well as not draining through which is also good news. No fevers, chills, nausea, vomiting, or diarrhea. 01/04/2021 upon evaluation today patient appears to be doing well with regard to his wound. Overall things seem to be doing quite well. He did have a little bit of reaction to the CarboFlex Sorbact he will be using that any longer. With that being said he is controlled as far as the drainage is concerned overall and seems to be doing quite well. I do not see any signs of active infection at this time which is great news. No fevers, chills, nausea, vomiting, or diarrhea. 01/11/2021 upon evaluation today patient appears to be doing well with regard to his wounds. He has been tolerating the dressing changes without complication. Fortunately there  does not appear to be any signs of active infection at this time which is great news. Overall I am extremely pleased with where we stand currently. No fevers, chills, nausea, vomiting, or diarrhea. Where using clobetasol in the wound bed he has a lot of new skin growth which is awesome as well. 01/18/2021 upon evaluation today patient appears to be doing very well in regard to his leg ulcer. He has been tolerating the dressing changes without complication. Fortunately there does not appear to be any signs  of active infection which is great news. In general I think that he is making excellent progress 01/25/2021 upon evaluation today patient appears to be doing well with regard to his wound on the leg. I am actually extremely pleased with where things stand today. There does not appear to be any signs of active infection which is great news and overall I think that we are definitely headed in the appropriate direction based on what I am seeing currently. There does not appear to be any signs of active infection also excellent news. 02/06/2021 upon evaluation today patient appears to be doing well with regard to his wound. Overall visually this is showing signs of significant improvement which is great news. I do not see any signs of active infection systemically which is great even locally I do not think that we are seeing any major complications here. We did do fluorescence imaging with the [employer] today. The patient does have some odor and drainage noted and again this is something that I think would benefit him to probably come more frequently for nurse visits. 02/19/2021 upon evaluation today patient actually appears to be doing quite well in regard to his wound. He has been tolerating the dressing Fussner, Rivaldo ([id number]) changes without complication and overall I think that this is making excellent progress. I do not see any evidence of active infection at this point which is great news as well. No fevers, chills, nausea, vomiting, or diarrhea. 10/10; wound is made nice progress healthy granulation with a nice rim of epithelialization which seems to be expanding even from last week he has a deeper area in the inferior part of the more distal part of the wound with not quite as healthy as surface. This area will need to be followed. Using clobetasol and Hydrofera Blue 03/05/2021 upon evaluation today patient appears to be doing very well in regard to his leg ulcer. He has been tolerating  dressing changes without complication. Fortunately there does not appear to be any signs of active infection which is great news and overall I am extremely pleased with where we stand currently. 03/12/2021 upon evaluation today patient appears to be doing well with regard to his wound in fact this is extremely extremely good based on what we are seeing today there does not appear to be any signs of active infection and overall I think that he is doing awesome from the standpoint of healing in general. I am extremely pleased with how things seem to be progressing with regard to this pyoderma. Clobetasol has done wonders for him. I think the compression wrapping has also been of great benefit. 03/19/2021 upon evaluation today patient appears to be doing well with regard to his wound. He is tolerating the dressing changes without complication. In fact I feel like that he is actually making excellent progress at this point based on what I am seeing. No fevers, chills, nausea, vomiting, or diarrhea. 03/26/2021 upon evaluation today patient appears to  be doing well with regard to his wound. This again is measuring smaller and looking better. Again the progress is slow but nonetheless continual with what we have been seeing. I do believe that the current plan is doing awesome for him. 04/09/2021 upon evaluation today patient appears to be doing well with regard to his leg ulcer. This is showing signs of excellent improvement the muscle is completely closed over and there does not appear to be any evidence of inflammation at this point his drainage is significantly improved. Overall I think that he would be a good candidate for looking into a skin substitute at this point as well. We will get a look into some approvals in that regard. Potentially TheraSkin as well as Apligraf could both be considered just depending on insurance coverage. 04/16/2021 upon evaluation today patient appears to be doing well at  this point. He has been approved for the Apligraf which we could definitely order although I would like to try to get the TheraSkin approved if at all possible. I did fax notes into them today and I Georgina Peer try to give a call as well. Overall the wound appears to be doing decently well today. 04/20/2021 upon evaluation today patient actually appears to be doing quite well in regard to his wound with that being said we are trying to see what we can do about speeding up the healing process. For that reason we did discuss the possibility of a skin substitute. We got the Apligraf approved. For that reason we will get a go ahead and see what we can do with the Apligraf at this point. I am still trying to get the TheraSkin approved but I have not heard anything from the insurance company yet I have called and talked to them earlier in the week and to be honest this was about half an hour that I spent on the phone they told me I would hear something within 10-15 business days. 04/27/2021 upon evaluation today patient appears to be doing excellent in regard to his wound. I do believe the Apligraf has been beneficial. With that being said he has had a little bit of increased pain has been a little bit concerned. I do want to go ahead and apply his steroid cream today the clobetasol and then put the Apligraf over I just do not think I want to risk not putting the clobetasol on based on what is been going on here currently. Patient voiced understanding. 05/04/2021 upon evaluation today patient is making excellent improvement. Overall there is definitely decrease in the size of the wound today and I am very pleased in that regard. I do not see any signs of active infection locally nor systemically at this point. No fevers, chills, nausea, vomiting, or diarrhea. 05/11/2021 upon evaluation today patient appears to be doing well with regard to his wound. He has been tolerating Apligraf's without complication and is  making excellent progress this is application #4 today. 12/30; patient here for Apligraf application. Appears to be doing well small wound there has been a lot of healing 05/28/2021 upon evaluation today patient appears to be doing well with regard to his wound. Has been tolerating the dressing changes without complication. Fortunately I do not see any signs of active infection locally nor systemically at this point which is great news. He is done with the Apligraf and the first round and overall this has filled in quite nicely I am not even certain he needs any additional at  the moment. I would actually try to go back to clobetasol with [facility] which previously was doing well for him. 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 He is using clobetasol and Hydrofera Blue. Surface area of the wound is smaller and the surface looks very healthy. 06/11/2021 upon evaluation today patient actually appears to be showing signs of good improvement which is great news. Fortunately I do not see any signs of active infection at this time which is also excellent news. I do believe that the patient is doing well with the clobetasol and Hydrofera Blue. He does have some irritation and itching around the rest of the leg will use some triamcinolone over this area. 06/18/2021 upon evaluation today patient appears to be doing well with regard to his wound. He is showing signs of excellent improvement and overall I am extremely pleased with where we stand today. We are moving in the right direction. 06/25/2021 upon evaluation today patient appears to be doing well with regard to his wound. In fact this is doing also not showing signs of excellent improvement overall very pleased with where we stand today. I do not see any evidence of active infection locally or systemically at this time which is also great news. 07/02/2021 upon evaluation patient's wound bed actually  showed signs of good granulation and epithelization at this point. And this is measuring significantly smaller and overall I am extremely pleased with where we stand today. No fevers, chills, nausea, vomiting, or diarrhea. 07/09/2021 upon evaluation patient's wound bed actually showed signs of good granulation and epithelization at this point. Fortunately there does not appear to be any evidence of active infection locally or systemically which is great news and overall I am extremely pleased with where we stand at this point. 07/16/2021 upon evaluation today patient appears to be doing well with regard to his wound all things considered although it is maybe a little bit larger than what its been. This does have me a little concerned. Actually question about whether anything is changed and initially he told me [name], GRISSO ([id number]) know. In fact it was not until the end of the visit that he actually did mention that he had missed his infusion in January. This very well may be what is because the difference is also seeing currently. That definitely has seem to been helping more recently. 07/23/2021 upon evaluation today patient's wound is yet again larger despite the switch to a collagen which was not beneficial. Subsequently I am going to at this point check on a couple things. First and foremost I do want to go ahead and do a culture. I think that there is a chance he could have a low-lying infection that may be part of the issue here. Subsequently I am also probably can go ahead and place him on Bactrim DS which I think is a good option and has done well with them in the past. Again if this is an infection that should hopefully help to turn things around. With all that being said I do believe that the patient should hopefully be able to turn this around with reinitiation of the infusion therapy which will be going on this week and subsequently getting on the antibiotic. 3/13; patient presents for  follow-up. He has no issues or complaints today. He states he is currently on oral antibiotics for previous culture done in the office. 08/06/2021 upon evaluation today patient appears to be doing well with  regard to his wound. I am actually seeing signs of improvement which is great news. I think that he is on a better track he did receive his infusion as well which is great news and overall I think that this should hopefully get him back on track as far as healing is concerned. He is not having any pain and I do not see any signs of inflammation which is great news. 08/13/2021 upon evaluation today patient appears to be doing excellent in regard to his wound on the leg. He has been tolerating the dressing changes without complication. Fortunately I do not see any signs of infection I do believe he is making good progress here and I think her back on track overall the tissue is greatly improved compared to what we have been. 08/20/2021 upon evaluation today patient appears to be doing excellent in regard to his wound. He has been tolerating the dressing changes without complication. Fortunately I do not see any evidence of infection at this time locally or systemically which is great news and overall very pleased with where we stand. 08-31-2021 upon evaluation today patient appears to be doing excellent in regard to his wound. Overall even with the vacation and extra walking he seems to have really done well. Fortunately I do not see any evidence of active infection locally or systemically which is great news and overall I think we are headed in the right direction. 09-10-2021 upon evaluation today patient's wound is showing signs of improvement. This is a small improvement but nonetheless we seem to be making progress here. I am very pleased in that regard. There does not appear to be any signs of infection and overall things are doing quite nicely. Upon inspection patient's wound bed actually showed  signs of good granulation and epithelization at this point. There was some irritation around the edges of the wound where the good skin was it almost appears that something was rubbing or else is was due from drainage and irritation. Nonetheless I am going to see about putting a little bit of zinc just around the wound opening in order to protect the skin from being damaged. The patient is in agreement with that plan. 5/8; its been a while since I have seen this wound and it looks considerably better although still with some depth. Per our intake nurse the dimensions have improved patient has been using a thin layer of clobetasol, Hydrofera Blue under 4-layer compression. He appears to be making progress 10-01-2021 upon evaluation today patient appears to be doing well with regard to his wound although it is somewhat stalled I feel like were basically at a standstill here. He has previously had Apligraf which did very well for him to be honest. I do believe that he may benefit from a repeat of the Apligraf. In fact it got so small at that point but I do not think we even used all of the applications that were recommended as this had improved to such a degree. Nonetheless I do believe that the patient currently could benefit from repeat treatment with the Apligraf due to the improvement this that we did see. He is not opposed to this and subsequently working to look into getting approval to reinstitute that treatment. 10-08-2021 upon evaluation today patient appears to be doing well with regard to his wound. Overall I feel like he is doing a little bit better from the standpoint of irritation at this time which is great news. I do not see  any signs of active infection locally or systemically which is great news as well. No fevers, chills, nausea, vomiting, or diarrhea. 10-12-2021 upon evaluation today patient appears to be doing well with regard to his wound stable although we are can initiate treatment  with Apligraf today. I am hoping this will stimulate things to improve. Patient is in agreement with that plan. 10-19-2028 upon evaluation today patient appears to be doing well with regard to his wound. He has been tolerating the dressing changes without complication. Fortunately there does not appear to be any evidence of active infection locally or systemically at this time which is great news. No fevers, chills, nausea, vomiting, or diarrhea. I do believe the Apligraf has improved the overall appearance of the wound bed receiving some definite improvements here. 10-26-2021 upon evaluation today patient's wound is actually shown some signs of really good improvement I am very pleased in this regard. There is a little bit of slough and biofilm buildup that is stuck to the surface of the wound I am getting very lightly debrided in order to clear this away and then subsequently will get a continue with the Apligraf application today. This is #3. 11-09-2021 upon evaluation today patient appears to be doing well currently in regard to his wound. This is actually showing signs of significant improvement which is great news and overall I think the Apligraf is doing an amazing job. This wound is significantly smaller compared to the last time I saw him. This will be Apligraf application #4. 10-19-5636 upon evaluation today patient appears to be doing well currently in regard to his wound he is actually showing signs of excellent improvement which is great news and overall I am extremely pleased with where we stand today. I do not see any evidence of active infection locally or systemically at this time which is great news. No fevers, chills, nausea, vomiting, or diarrhea. I believe the Apligraf is doing an awesome job and in fact I [place] see about getting an extension of application which hopefully should not be a problem. Especially in light of how much improvement he has had with regards to the size of the  wound with treatment. This is application #5 today. 12-07-2021 upon evaluation today patient appears to be doing excellent in regard to his wound has been tolerating the dressing changes without complication. Fortunately there does not appear to be any signs of active infection locally or systemically at this time. 12-21-2021 upon evaluation today patient appears to be doing well currently in regard to his wound. We have gotten approval for a repeat Apligraf Krumholz, Rashid ([id number]) treatment. I think that is going to be a good way to wrap this up I really feel like we will likely be able to get the wound closed with the additional treatments that have been improved. Electronic Signature(s) Signed: 12/21/2021 9:47:49 AM By: Worthy Keeler PA-C Entered By: Worthy Keeler on 12/21/2021 09:47:49 DANN, GALICIA ([id number]) -------------------------------------------------------------------------------- Physical Exam Details Patient Name: [name] Date of Service: 12/21/2021 8:15 AM Medical Record Number: [id number] Patient Account Number: [id number] Date of Birth/Sex: [birth year] (40 y.o. M) Treating RN: [name] Primary Care Provider: [name] Other Clinician: Massie Kluver Referring Provider: [name] Treating Provider/Extender: [name] Cliche in Treatment: 263 Constitutional Well-nourished and well-hydrated in no acute distress. Respiratory normal breathing without difficulty. Psychiatric this patient is able to make decisions and demonstrates good insight into disease process. Alert and Oriented x 3. pleasant and cooperative. Notes Upon inspection  patient's wound did not require any debridement whatsoever and appears to be doing excellent is very clean and I am extremely pleased in this regard. I am glad that we did get the repeat Apligraf that was applied today and the patient tolerated this without complications. Electronic Signature(s) Signed: 12/21/2021 9:48:06 AM  By: Worthy Keeler PA-C Entered By: Worthy Keeler on 12/21/2021 09:48:06 [name], [name] ([id number]) -------------------------------------------------------------------------------- Physician Orders Details Patient Name: [name] Date of Service: 12/21/2021 8:15 AM Medical Record Number: [id number] Patient Account Number: [id number] Date of Birth/Sex: 1980/09/17 (40 y.o. M) Treating RN: [name] Primary Care Provider: [name] Other Clinician: Massie Kluver Referring Provider: [name] Treating Provider/Extender: [name] Cliche in Treatment: 263 Verbal / Phone Orders: No Diagnosis Coding ICD-10 Coding Code Description I87.2 Venous insufficiency (chronic) (peripheral) L97.222 Non-pressure chronic ulcer of left calf with fat layer exposed E11.622 Type 2 diabetes mellitus with other skin ulcer L88 Pyoderma gangrenosum F17.208 Nicotine dependence, unspecified, with other nicotine-induced disorders Follow-up Appointments o Return Appointment in 1 week. o Nurse Visit as needed - twice a week Bathing/ Shower/ Hygiene o Clean wound with Normal Saline or wound cleanser. o May shower with wound dressing protected with water repellent cover or cast protector. o No tub bath. Cellular or Tissue Based Products Wound #1 Left,Lateral Lower Leg o Cellular or Tissue Based Product applied to wound bed; including contact layer, fixation with steri-strips, dry gauze and cover dressing. (DO NOT REMOVE). - Apligraf #6 applied Edema Control - Lymphedema / Segmental Compressive Device / Other o Optional: One layer of unna paste to top of compression wrap (to act as an anchor). - He needs this to hold it up o Elevate, Exercise Daily and Avoid Standing for Long Periods of Time. o Elevate legs to the level of the heart and pump ankles as often as possible o Elevate leg(s) parallel to the floor when sitting. Wound Treatment Wound #1 - Lower Leg Wound Laterality:  Left, Lateral Cleanser: Wound Cleanser 3 x Per Week/30 Days Discharge Instructions: Wash your hands with soap and water. Remove old dressing, discard into plastic bag and place into trash. Cleanse the wound with Wound Cleanser prior to applying a clean dressing using gauze sponges, not tissues or cotton balls. Do not scrub or use excessive force. Pat dry using gauze sponges, not tissue or cotton balls. Peri-Wound Care: Desitin Maximum Strength Ointment 4 (oz) 3 x Per Week/30 Days Discharge Instructions: Apply around wound edges to avoid maceration Peri-Wound Care: Triamcinolone Acetonide Cream, 0.1%, 15 (g) tube 3 x Per Week/30 Days Discharge Instructions: Apply to leg Topical: Clobetasol Propionate ointment 0.05%, 60 (g) tube 3 x Per Week/30 Days Discharge Instructions: Pt to bring to appt- applied to wound bed Primary Dressing: Apligraf 3 x Per Week/30 Days Secondary Dressing: (NON-BORDER) Zetuvit Plus Silicone NON-BORDER 5x5 (in/in) 3 x Per Week/30 Days Compression Wrap: Medichoice 4 layer Compression System, 35-40 mmHG 3 x Per Week/30 Days Discharge Instructions: Apply multi-layer wrap as directed. [name], [name] ([id number]) Electronic Signature(s) Signed: 12/21/2021 3:54:34 PM By: Worthy Keeler PA-C Signed: 12/24/2021 4:15:07 PM By: Massie Kluver Entered By: Massie Kluver on 12/21/2021 08:42:21 [name], [name] ([id number]) -------------------------------------------------------------------------------- Problem List Details Patient Name: [name] Date of Service: 12/21/2021 8:15 AM Medical Record Number: [id number] Patient Account Number: [id number] Date of Birth/Sex: 1980/10/25 (41 y.o. M) Treating RN: [name] Primary Care Provider: [name] Other Clinician: Massie Kluver Referring Provider: [name] Treating Provider/Extender: [name] Cliche in Treatment: 263 Active Problems ICD-10  Encounter Code Description Active Date MDM Diagnosis I87.2 Venous  insufficiency (chronic) (peripheral) 12/04/2016 No Yes L97.222 Non-pressure chronic ulcer of left calf with fat layer exposed 12/04/2016 No Yes E11.622 Type 2 diabetes mellitus with other skin ulcer 04/09/2021 No Yes L88 Pyoderma gangrenosum 03/26/2017 No Yes F17.208 Nicotine dependence, unspecified, with other nicotine-induced disorders 04/24/2020 No Yes Inactive Problems ICD-10 Code Description Active Date Inactive Date L97.213 Non-pressure chronic ulcer of right calf with necrosis of muscle 04/02/2017 04/02/2017 Resolved Problems ICD-10 Code Description Active Date Resolved Date L97.321 Non-pressure chronic ulcer of left ankle limited to breakdown of skin 10/08/2019 10/08/2019 L03.116 Cellulitis of left lower limb 05/24/2019 05/24/2019 Electronic Signature(s) Signed: 12/21/2021 3:54:34 PM By: Worthy Keeler PA-C Entered By: Worthy Keeler on 12/21/2021 08:25:28 [name], [name] ([id number]) -------------------------------------------------------------------------------- Progress Note Details Patient Name: [name] Date of Service: 12/21/2021 8:15 AM Medical Record Number: [id number] Patient Account Number: [id number] Date of Birth/Sex: 09/07/1980 (40 y.o. M) Treating RN: [name] Primary Care Provider: [name] Other Clinician: Massie Kluver Referring Provider: [name] Treating Provider/Extender: [name] Cliche in Treatment: 263 Subjective Chief Complaint Information obtained from Patient He is here in follow up evaluation for LLE pyoderma ulcer History of Present Illness (HPI) 12/04/16; 41 year old man who comes into the clinic today for review of a wound on the posterior left calf. He tells me that is been there for about a year. He is not a diabetic he does smoke half a pack per day. He was seen in the ER on 11/20/16 felt to have cellulitis around the wound and was given clindamycin. An x-ray did not show osteomyelitis. The patient initially tells me that he has a  milk allergy that sets off a pruritic itching rash on his lower legs which she scratches incessantly and he thinks that's what may have set up the wound. He has been using various topical antibiotics and ointments without any effect. He works in a [employer]ing [employer] and is on his feet all day. He does not have a prior history of wounds however he does have the rash on both lower legs the right arm and the ventral aspect of his left arm. These are excoriations and clearly have had scratching however there are of macular looking areas on both legs including a substantial larger area on the right leg. This does not have an underlying open area. There is no blistering. The patient tells me that 2 years ago in [place] in response to the rash on his legs he saw a dermatologist who told him he had a condition which may be pyoderma gangrenosum although I may be putting words into his mouth. He seemed to recognize this. On further questioning he admits to a 5 year history of quiesced. ulcerative colitis. He is not in any treatment for this. He's had no recent travel 12/11/16; the patient arrives today with his wound and roughly the same condition we've been using silver alginate this is a deep punched out wound with some surrounding erythema but no tenderness. Biopsy I did did not show confirmed pyoderma gangrenosum suggested nonspecific inflammation and vasculitis but does not provide an actual description of what was seen by the pathologist. I'm really not able to understand this We have also received information from the patient's dermatologist in [place] notes from April 2016. This was a doctor Agarwal-antal. 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 He was prescribed topical steroid high potency under occlusion which helped but at this point the patient did  not have a deep punched out wound. 12/18/16; the patient's wound is larger in terms of surface area however this surface looks better and there is  less depth. The surrounding erythema also is better. The patient states that the wrap we put on came off 2 days ago when he has been using his compression stockings. He we are in the process of getting a dermatology consult. 12/26/16 on evaluation today patient's left lower extremity wound shows evidence of infection with surrounding erythema noted. He has been tolerating the dressing changes but states that he has noted more discomfort. There is a larger area of erythema surrounding the wound. No fevers, chills, nausea, or vomiting noted at this time. With that being said the wound still does have slough covering the surface. He is not allergic to any medication that he is aware of at this point. In regard to his right lower extremity he had several regions that are erythematous and pruritic he wonders if there's anything we can do to help that. 01/02/17 I reviewed patient's wound culture which was obtained his visit last week. He was placed on doxycycline at that point. Unfortunately that does not appear to be an antibiotic that would likely help with the situation however the pseudomonas noted on culture is sensitive to Cipro. Also unfortunately patient's wound seems to have a large compared to last week's evaluation. Not severely so but there are definitely increased measurements in general. He is continuing to have discomfort as well he writes this to be a seven out of 10. In fact he would prefer me not to perform any debridement today due to the fact that he is having discomfort and considering he has an active infection on the little reluctant to do so anyway. No fevers, chills, nausea, or vomiting noted at this time. 01/08/17; patient seems dermatology on September 5. I suspect dermatology will want the slides from the biopsy I did sent to their pathologist. I'm not sure if there is a way we can expedite that. In any case the culture I did before I left on vacation 3 weeks ago showed Pseudomonas  he was given 10 days of Cipro and per her description of her intake nurses is actually somewhat better this week although the wound is quite a bit bigger than I remember the last time I saw this. He still has 3 more days of Cipro 01/21/17; dermatology appointment tomorrow. He has completed the ciprofloxacin for Pseudomonas. Surface of the wound looks better however he is had some deterioration in the lesions on his right leg. Meantime the left lateral leg wound we will continue with sample 01/29/17; patient had his dermatology appointment but I can't yet see that note. He is completed his antibiotics. The wound is more superficial but considerably larger in circumferential area than when he came in. This is in his left lateral calf. He also has swollen erythematous areas with superficial wounds on the right leg and small papular areas on both arms. There apparently areas in her his upper thighs and buttocks I did not look at those. Dermatology biopsied the right leg. Hopefully will have their input next week. 02/05/17; patient went back to see his dermatologist who told him that he had a "scratching problem" as well as staph. He is now on a 30 day course of doxycycline and I believe she gave him triamcinolone cream to the right leg areas to help with the itching [not exactly sure but probably triamcinolone]. She apparently looked at the  left lateral leg wound although this was not rebiopsied and I think felt to be ultimately part of the same pathogenesis. He is using sample border foam and changing nevus himself. He now has a new open area on the right posterior leg which was his biopsy site I don't have any of the dermatology notes 02/12/17; we put the patient in compression last week with SANTYL to the wound on the left leg and the biopsy. Edema is much better and the depth of the wound is now at level of skin. Area is still the same Biopsy site on the right lateral leg we've also been using santyl  with a border foam dressing and he is changing this himself. 02/19/17; Using silver alginate started last week to both the substantial left leg wound and the biopsy site on the right wound. He is tolerating compression well. Has a an appointment with his primary M.D. tomorrow wondering about diuretics although I'm wondering if the edema problem is actually lymphedema DMONTE, MAHER ([id number]) 02/26/17; the patient has been to see his primary doctor Dr. Jerrel Ivory at [place] our primary care. She started him on Lasix 20 mg and this seems to have helped with the edema. However we are not making substantial change with the left lateral calf wound and inflammation. The biopsy site on the right leg also looks stable but not really all that different. 03/12/17; the patient has been to see vein and vascular Dr. Lucky Cowboy. He has had venous reflux studies I have not reviewed these. I did get a call from his dermatology office. 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 They therefore recommended clobetasol and tetracycline i.e. aggressive treatment for possible pyoderma gangrenosum. 03/26/17; apparently the patient just had reflux studies not an appointment with Dr. dew. She arrives in clinic today having applied clobetasol for 2-3 weeks. He notes over the last 2-3 days excessive drainage having to change the dressing 3-4 times a day and also expanding erythema. He states the expanding erythema seems to come and go and was last this red was earlier in the month.he is on doxycycline 150 mg twice a day as an anti-inflammatory systemic therapy for possible pyoderma gangrenosum along with the topical clobetasol 04/02/17; the patient was seen last week  by Dr. Lillia Carmel at [facility] dermatology locally who kindly saw him at my request. 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 My concern was the degree of erythema medially extending from his left leg wound which was either inflammation from pyoderma or cellulitis. I put him on Augmentin however culture of the wound showed Pseudomonas which is quinolone sensitive. I really don't believe he has cellulitis however in view of everything I will continue and give him a course of Cipro. He is also on doxycycline as an immune modulator for the pyoderma. In addition to his original wound on the left lateral leg with surrounding erythema he has a wound on the right posterior calf which was an original biopsy site done by dermatology. 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 Saw Dr. Lillia Carmel yesterday. He has been using topical antibiotics to both wound areas his original wound  on the left and the biopsies/pathergy area on the right. There is definitely some improvement in the inflammation around the wound on the right although the patient states he has increasing sensitivity of the wounds. He is on prednisone 60 and doxycycline 1 as prescribed by Dr. Lillia Carmel. He is covering the topical antibiotic with gauze and putting this in his own compression stocks and changing this daily. He states that Dr. Lottie Rater did a culture of the left leg wound yesterday 05/07/17; pyoderma gangrenosum. The patient saw Dr. Lillia Carmel yesterday and has a follow-up with her in one month. He is still using topical antibiotics to both wounds although he can't recall exactly what type. He is still on prednisone 60 mg. Dr. Lillia Carmel stated that the doxycycline could stop if we were in agreement. 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 The right medial leg was induced by biopsy/pathergy. The area on the right is essentially healed. Still on high-dose prednisone using topical antibiotics to the wound 07/09/17; pyoderma gangrenosum with wounds on the left lateral leg. The right medial leg has closed and remains closed. He is still on prednisone 60. He tells me he missed his last dermatology appointment with Dr. Lillia Carmel but will make another appointment. He reports that her blood sugar at a recent screen in [place] was high 200's. He was 180 today. 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 I think he is going to require still much longer prednisone perhaps another 3 months before attempting to taper. In the meantime his wound is a lot better. Smaller. He is cleaning this off daily and applying topical antibiotics. When he was last in the clinic I thought about changing to [facility] and actually put in a couple of calls to dermatology although probably not during their business hours. 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 He continues on oral prednisone. He has been using triple antibiotic ointment. There is surface debris and we will transition to [facility] and have him return in 2 weeks. He has lost 30 pounds since his last appointment with lifestyle modification. He may benefit from topical steroid cream for treatment this can be considered at a later date. 08/22/17 on evaluation today patient appears to actually be doing rather well in regard to his left lateral lower extremity ulcer. He has actually been managed by Dr. Dellia Nims most recently. Patient is currently on oral steroids at this time. This seems to have been of benefit for him. Nonetheless his last visit was actually with Leah on 08/06/17. Currently he is not utilizing any topical steroid creams although this could be  of benefit as well. No fevers, chills, nausea, or vomiting noted at this time. 09/05/17 on evaluation today patient appears to be doing better in regard to his left lateral lower extremity ulcer. He has been tolerating the dressing changes without complication. He is using Santyl with good effect. Overall I'm very pleased with how things are standing at this point. Patient likewise is happy that this is doing better. 09/19/17 on evaluation today patient actually appears to be doing rather well in regard to his left lateral lower extremity ulcer. Again this is secondary to Pyoderma gangrenosum and he seems to be progressing well with the [facility] which  is good news. He's not having any significant pain. 10/03/17 on evaluation today patient appears to be doing excellent in regard to his lower extremity wound on the left secondary to Pyoderma gangrenosum. He has been tolerating the Santyl without complication and in general I feel like he's making good progress. 10/17/17 on evaluation today patient appears to be doing very well in regard to his left lateral lower surety ulcer. He has been tolerating the dressing changes without complication. There does not appear to be any evidence of infection he's alternating the Santyl and the triple antibiotic ointment every other day this seems to be doing well for him. 11/03/17 on evaluation today patient appears to be doing very well in regard to his left lateral lower extremity ulcer. He is been tolerating the dressing changes without complication which is good news. Fortunately there does not appear to be any evidence of infection which is also great news. Overall is doing excellent they are starting to taper down on the prednisone is down to 40 mg at this point it also started topical clobetasol for him. 11/17/17 on evaluation today patient appears to be doing well in regard to his left lateral lower surety ulcer. He's been tolerating the dressing changes without  complication. He does note that he is having no pain, no excessive drainage or discharge, and overall he feels like things are going about how he would expect and hope they would. Overall he seems to have no evidence of infection at this time in my opinion which is good news. 12/04/17-He is seen in follow-up evaluation for right lateral lower extremity ulcer. He has been applying topical steroid cream. Today's measurement show slight increase in size. Over the next 2 weeks we will transition to every other day Santyl and steroid cream. He has been encouraged to monitor for changes and notify clinic with any concerns 12/15/17 on evaluation today patient's left lateral motion the ulcer and fortunately is doing worse again at this point. This just since last week to this week has close to doubled in size according to the patient. I did not seeing last week's I do not have a visual to compare this to in our system was also down so we do not have all the charts and at this point. Nonetheless it does have me somewhat concerned in regard to the fact that again he was worried enough about it he has contact the dermatology that placed them back on the full strength, 50 mg a day of the prednisone that he was taken previous. He continues to alternate using clobetasol along with Santyl at this point. He is obviously somewhat frustrated. MICHOLAS, DRUMWRIGHT ([id number]) 12/22/17 on evaluation today patient appears to be doing a little worse compared to last evaluation. Unfortunately the wound is a little deeper and slightly larger than the last week's evaluation. With that being said he has made some progress in regard to the irritation surrounding at this time unfortunately despite that progress that's been made he still has a significant issue going on here. I'm not certain that he is having really any true infection at this time although with the Pyoderma gangrenosum it can sometimes be difficult to differentiate  infection versus just inflammation. For that reason I discussed with him today the possibility of perform a wound culture to ensure there's nothing overtly infected. 01/06/18 on evaluation today patient's wound is larger and deeper than previously evaluated. With that being said it did appear that his wound was infected after  my last evaluation with him. Subsequently I did end up prescribing a prescription for Bactrim DS which she has been taking and having no complication with. Fortunately there does not appear to be any evidence of infection at this point in time as far as anything spreading, no want to touch, and overall I feel like things are showing signs of improvement. 01/13/18 on evaluation today patient appears to be even a little larger and deeper than last time. There still muscle exposed in the base of the wound. Nonetheless he does appear to be less erythematous I do believe inflammation is calming down also believe the infection looks like it's probably resolved at this time based on what I'm seeing. No fevers, chills, nausea, or vomiting noted at this time. 01/30/18 on evaluation today patient actually appears to visually look better for the most part. Unfortunately those visually this looks better he does seem to potentially have what may be an abscess in the muscle that has been noted in the central portion of the wound. This is the first time that I have noted what appears to be fluctuance in the central portion of the muscle. With that being said I'm somewhat more concerned about the fact that this might indicate an abscess formation at this location. I do believe that an ultrasound would be appropriate. This is likely something we need to try to do as soon as possible. He has been switch to mupirocin ointment and he is no longer using the steroid ointment as prescribed by dermatology he sees them again next week he's been decreased from 60 to 40 mg of prednisone. 03/09/18 on  evaluation today patient actually appears to be doing a little better compared to last time I saw him. There's not as much erythema surrounding the wound itself. He I did review his most recent infectious disease note which was dated 02/24/18. He saw Dr. Michel Bickers in [place]. 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 Patient is now off of antibiotics and they are continually observing this. There seems to be no change in the past two weeks in my pinion based on what the patient says and what I see today compared to what Dr. Megan Salon likely saw two weeks ago. No fevers, chills, nausea, or vomiting noted at this time. 03/23/18 on evaluation today patient's wound actually appears to be showing signs of improvement which is good news. He is currently still on the Dapsone. He is also working on tapering the prednisone to get off of this and Dr. Lottie Rater is working with him in this regard. Nonetheless overall I feel like the wound is doing well it does appear based on the infectious disease note that I reviewed from Dr. 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 I did also review the results of his ultrasound of left lower extremity which revealed there was a dentist tissue in the base of the wound without an abscess noted. 04/06/18 on evaluation today the patient's left lateral lower extremity ulcer actually appears to be doing fairly well which is excellent news. There does not appear to be any evidence of infection at this time which is also great news. Overall he still does have a significantly large ulceration although little by little he seems to be making progress.  He is down to 10 mg a day of the prednisone. 04/20/18 on evaluation today patient actually appears to be doing excellent at this time  in regard to his left lower extremity ulcer. He's making signs of good progress unfortunately this is taking much longer than we would really like to see but nonetheless he is making progress. Fortunately there does not appear to be any evidence of infection at this time. No fevers, chills, nausea, or vomiting noted at this time. The patient has not been using the Santyl due to the cost he hadn't got in this field yet. He's mainly been using the antibiotic ointment topically. Subsequently he also tells me that he really has not been scrubbing in the shower I think this would be helpful again as I told him it doesn't have to be anything too aggressive to even make it believe just enough to keep it free of some of the loose slough and biofilm on the wound surface. 05/11/18 on evaluation today patient's wound appears to be making slow but sure progress in regard to the left lateral lower extremity ulcer. He is been tolerating the dressing changes without complication. Fortunately there does not appear to be any evidence of infection at this time. He is still just using triple antibiotic ointment along with clobetasol occasionally over the area. He never got the Santyl and really does not seem to intend to in my pinion. 06/01/18 on evaluation today patient appears to be doing a little better in regard to his left lateral lower extremity ulcer. He states that overall he does not feel like he is doing as well with the Dapsone as he did with the prednisone. Nonetheless he sees his dermatologist later today and is gonna talk to them about the possibility of going back on the prednisone. Overall again I believe that the wound would be better if you would utilize Santyl but he really does not seem to be interested in going back to the [place] at this point. He has been using triple antibiotic ointment. 06/15/18 on evaluation today patient's wound actually appears to be doing about the same at this point.  Fortunately there is no signs of infection at this time. He has made slight improvements although he continues to not really want to clean the wound bed at this point. He states that he just doesn't mess with it he doesn't want to cause any problems with everything else he has going on. He has been on medication, antibiotics as prescribed by his dermatologist, for a staff infection of his lower extremities which is really drying out now and looking much better he tells me. Fortunately there is no sign of overall infection. 06/29/18 on evaluation today patient appears to be doing well in regard to his left lateral lower surety ulcer all things considering. Fortunately his staff infection seems to be greatly improved compared to previous. He has no signs of infection and this is drying up quite nicely. He is still the doxycycline for this is no longer on cental, Dapsone, or any of the other medications. His dermatologist has recommended possibility of an infusion but right now he does not want to proceed with that. 07/13/18 on evaluation today patient appears to be doing about the same in regard to his left lateral lower surety ulcer. Fortunately there's no signs of infection at this time which is great news. Unfortunately he still builds up a significant amount of Slough/biofilm of the surface of the wound he still is not  really cleaning this as he should be appropriately. Again I'm able to easily with saline and gauze remove the majority of this on the surface which if you would do this at home would likely be a dramatic improvement for him as far as getting the area to improve. Nonetheless overall I still feel like he is making progress is just very slow. I think Santyl will be of benefit for him as well. Still he has not gotten this as of this point. 07/27/18 on evaluation today patient actually appears to be doing little worse in regards of the erythema around the periwound region of the wound he also  tells me that he's been having more drainage currently compared to what he was experiencing last time I saw him. He states not quite as bad as what he had because this was infected previously but nonetheless is still appears to be doing poorly. Fortunately there is no evidence of systemic infection at this point. The patient tells me that he is not going to be able to afford the Santyl. He is still waiting to hear about the infusion therapy with his dermatologist. Apparently she wants an updated colonoscopy first. KOSTA, SCHNITZLER ([id number]) 08/10/18 on evaluation today patient appears to be doing better in regard to his left lateral lower extremity ulcer. Fortunately he is showing signs of improvement in this regard he's actually been approved for Remicade infusion's as well although this has not been scheduled as of yet. Fortunately there's no signs of active infection at this time in regard to the wound although he is having some issues with infection of the right lower extremity is been seen as dermatologist for this. Fortunately they are definitely still working with him trying to keep things under control. 09/07/18 on evaluation today patient is actually doing rather well in regard to his left lateral lower extremity ulcer. He notes these actually having some hair grow back on his extremity which is something he has not seen in years. He also tells me that the pain is really not giving them any trouble at this time which is also good news overall she is very pleased with the progress he's using a combination of the mupirocin along with the probate is all mixed. 09/21/18 on evaluation today patient actually appears to be doing fairly well all things considered in regard to his looks from the ulcer. He's been tolerating the dressing changes without complication. Fortunately there's no signs of active infection at this time which is good news he is still on all antibiotics or prevention of the staff  infection. He has been on prednisone for time although he states it is gonna contact his dermatologist and see if she put them on a short course due to some irritation that he has going on currently. Fortunately there's no evidence of any overall worsening this is going very slow I think cental would be something that would be helpful for him although he states that $50 for tube is quite expensive. He therefore is not willing to get that at this point. 10/06/18 on evaluation today patient actually appears to be doing decently well in regard to his left lateral leg ulcer. He's been tolerating the dressing changes without complication. Fortunately there's no signs of active infection at this time. Overall I'm actually rather pleased with the progress he's making although it's slow he doesn't show any signs of infection and he does seem to be making some improvement. I do believe that he may need a  switch up and dressings to try to help this to heal more appropriately and quickly. 10/19/18 on evaluation today patient actually appears to be doing better in regard to his left lateral lower extremity ulcer. This is shown signs of having much less Slough buildup at this point due to the fact he has been using the [employer]. Obviously this is very good news. The overall size of the wound is not dramatically smaller but again the appearance is. 11/02/18 on evaluation today patient actually appears to be doing quite well in regard to his lower [place] ulcer. A lot of the skin around the ulcer is actually somewhat irritating at this point this seems to be more due to the dressing causing irritation from the adhesive that anything else. Fortunately there is no signs of active infection at this time. 11/24/18 on evaluation today patient appears to be doing a little worse in regard to his overall appearance of his lower extremity ulcer. There's more erythema and warmth around the wound unfortunately. He is currently on  doxycycline which he has been on for some time. With that being said I'm not sure that seems to be helping with what appears to possibly be an acute cellulitis with regard to his left lower extremity ulcer. No fevers, chills, nausea, or vomiting noted at this time. 12/08/18 on evaluation today patient's wounds actually appears to be doing significantly better compared to his last evaluation. He has been using Santyl along with alternating tripling about appointment as well as the steroid cream seems to be doing quite well and the wound is showing signs of improvement which is excellent news. Fortunately there's no evidence of infection and in fact his culture came back negative with only normal skin flora noted. 12/21/2018 upon evaluation today patient actually appears to be doing excellent with regard to his ulcer. This is actually the best that I have seen it since have been helping to take care of him. It is both smaller as well as less slough noted on the surface of the wound and seems to be showing signs of good improvement with new skin growing from the edges. He has been using just the triamcinolone he does wonder if he can get a refill of that ointment today. 01/04/2019 upon evaluation today patient actually appears to be doing well with regard to his left lateral lower extremity ulcer. With that being said it does not appear to be that he is doing quite as well as last time as far as progression is concerned. There does not appear to be any signs of infection or significant irritation which is good news. With that being said I do believe that he may benefit from switching to a collagen based dressing based on how clean The wound appears. 01/18/2019 on evaluation today patient actually appears to be doing well with regard to his wound on the left lower extremity. He is not made a lot of progress compared to where we were previous but nonetheless does seem to be doing okay at this time which is  good news. There is no signs of active infection which is also good news. My only concern currently is I do wish we can get him into utilizing the collagen dressing his insurance would not pay for the supplies that we ordered although it appears that he may be able to order this through his supply company that he typically utilizes. This is [employer]. Nonetheless he did try to order it during the office visit today and  it appears this did go through. We will see if he can get that it is a different brand but nonetheless he has collagen and I do think will be beneficial. 02/01/2019 on evaluation today patient actually appears to be doing a little worse today in regard to the overall size of his wounds. Fortunately there is no signs of active infection at this time. That is visually. Nonetheless when this is happened before it was due to infection. For that reason were somewhat concerned about that this time as well. 02/08/2019 on evaluation today patient unfortunately appears to be doing slightly worse with regard to his wound upon evaluation today. Is measuring a little deeper and a little larger unfortunately. I am not really sure exactly what is causing this to enlarge he actually did see his dermatologist she is going to see about initiating Humira for him. Subsequently she also did do steroid injections into the wound itself in the periphery. Nonetheless still nonetheless he seems to be getting a little bit larger he is gone back to just using the steroid cream topically which I think is appropriate. I would say hold off on the collagen for the time being is definitely a good thing to do. Based on the culture results which we finally did get the final result back regarding it shows staph as the bacteria noted again that can be a normal skin bacteria based on the fact however he is having increased drainage and worsening of the wound measurement wise I would go ahead and place him on an antibiotic  today I do believe for this. 02/15/2019 on evaluation today patient actually appears to be doing somewhat better in regard to his ulcer. There is no signs of worsening at this time I did review his culture results which showed evidence of Staphylococcus aureus but not MRSA. Again this could just be more related to the normal skin bacteria although he states the drainage has slowed down quite a bit he may have had a mild infection not just colonization. And was much smaller and then since around10/04/2019 on evaluation today patient appears to be doing unfortunately worse as far as the size of the wound. I really feel like that this is steadily getting larger again it had been doing excellent right at the beginning of September we have seen a steady increase in the area of the wound it is almost 2-1/2 times the size it was on September 1. Obviously this is a bad trend this is not wanting to see. For that reason we went back to using just the topical triamcinolone cream which does seem to help with inflammation. I checked him for bacteria by way of culture and nothing showed positive there. I am considering giving him a short course of a tapering steroid Jatorian Renault, Tabias ([id number]) today to see if that is can be beneficial for him. The patient is in agreement with giving that a try. 03/08/2019 on evaluation today patient appears to be doing very well in comparison to last evaluation with regard to his lower extremity ulcer. This is showing signs of less inflammation and actually measuring slightly smaller compared to last time every other week over the past month and a half he has been measuring larger larger larger. Nonetheless I do believe that the issue has been inflammation the prednisone does seem to have been beneficial for him which is good news. No fevers, chills, nausea, vomiting, or diarrhea. 03/22/2019 on evaluation today patient appears to be doing about  the same with regard to his leg  ulcer. He has been tolerating the dressing changes without complication. With that being said the wound seems to be mostly arrested at its current size but really is not making any progress except for when we prescribed the prednisone. He did show some signs of dropping as far as the overall size of the wound during that interval week. Nonetheless this is something he is not on long-term at this point and unfortunately I think he is getting need either this or else the Humira which his dermatologist has discussed try to get approval for. With that being said he will be seeing his dermatologist on the 11th of this month that is November. 04/19/2019 on evaluation today patient appears to be doing really about the same the wound is measuring slightly larger compared to last time I saw him. He has not been into the office since November 2 due to the fact that he unfortunately had Covid as that his entire family. He tells me that it was rough but they did pull-through and he seems to be doing much better. Fortunately there is no signs of active infection at this time. No fevers, chills, nausea, vomiting, or diarrhea. 05/10/2019 on evaluation today patient unfortunately appears to be doing significantly worse as compared to last time I saw him. He does tell me that he has had his first dose of Humira and actually is scheduled to get the next one in the upcoming week. With that being said he tells me also that in the past several days he has been having a lot of issues with green drainage she showed me a picture this is more blue-green in color. He is also been having issues with increased sloughy buildup and the wound does appear to be larger today. Obviously this is not the direction that we want everything to take based on the starting of his Humira. Nonetheless I think this is definitely a result of likely infection and to be honest I think this is probably Pseudomonas causing the infection based on what  I am seeing. 05/24/2019 on evaluation today patient unfortunately appears to be doing significantly worse compared to his prior evaluation with me 2 weeks ago. I did review his culture results which showed that he does have Staph aureus as well as Pseudomonas noted on the culture. Nonetheless the Levaquin that I prescribed for him does not appear to have been appropriate and in fact he tells me he is no longer experiencing the green drainage and discharge that he had at the last visit. Fortunately there is no signs of active infection at this time which is good news although the wound has significantly worsened it in fact is much deeper than it was previous. We have been utilizing up to this point triamcinolone ointment as the prescription topical of choice but at this time I really feel like that the wound is getting need to be packed in order to appropriately manage this due to the deeper nature of the wound. Therefore something along the lines of an alginate dressing may be more appropriate. 05/31/2019 upon inspection today patient's wound actually showed signs of doing poorly at this point. Unfortunately he just does not seem to be making any good progress despite what we have tried. He actually did go ahead and pick up the Cipro and start taking that as he was noticing more green drainage he had previously completed the Levaquin that I prescribed for him as well. Nonetheless  he missed his appointment for the seventh last week on Wednesday with the wound care center and [facility] where his dermatologist referred him. Obviously I do think a second opinion would be helpful at this point especially in light of the fact that the patient seems to be doing so poorly despite the fact that we have tried everything that I really know how at this point. The only thing that ever seems to have helped him in the past is when he was on high doses of continual steroids that did seem to make a difference for  him. 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 06/07/2019 upon evaluation today patient unfortunately appears to be doing worse yet again with regard to his wound. In fact I am starting to question whether or not he may have a fluid pocket in the muscle at this point based on the bulging and the soft appearance to the central portion of the muscle area. There is not anything draining from the muscle itself at this time which is good news but nonetheless the wound is expanding. I am not really seeing any results of the Humira as far as overall wound progression based on what I am seeing at this point. The patient has been referred for second opinion with regard to his wound to the [facility] wound care center by his dermatologist which I definitely am not in opposition to. Unfortunately we tried multiple dressings in the past including collagen, alginate, and at one point even Hydrofera Blue. With that being said he is never really used it for any significant amount of time due to the fact that he often complains of pain associated with these dressings and then will go back to either using the Santyl which she has done intermittently or more frequently the triamcinolone. He is also using his own compression stockings. We have wrapped him in the past but again that was something else that he really was not a big fan of. 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 He seemed to do much better when he was on the steroids although honestly  I understand that the steroids are not necessarily the best medication to be on long-term obviously 06/14/2019 on evaluation today patient appears to be doing actually a little bit better with regard to the overall appearance with his leg. Unfortunately he does continue to have issues with what appears to be some fluid underneath the muscle although he did see the wound specialty center at [facility] last week their main goals were to see about infusion therapy in place of the Humira as they feel like that is not quite strong enough. They also recommended that we continue with the treatment otherwise as we are they felt like that was appropriate and they are okay with him continuing to follow-up here with [place] in that regard. With that being said they are also sending him to the vein specialist there to see about vein  stripping and if that would be of benefit for him. Subsequently they also did not really address whether or not an ultrasound of the muscle area to see if there is anything that needs to be addressed here would be appropriate or not. For that reason I discussed this with him last week I think we may proceed down that road at this point. 06/21/2019 upon evaluation today patient's wound actually appears to be doing slightly better compared to previous evaluations. I do believe that he has made a difference with regard to the progression here with the use of oral steroids. Again in the past has been the only thing that is really calm things down. He does tell me that from [facility] is gotten a good news from there that there are no further vein stripping that is necessary at this point. I do not have that available for review today although the patient did relay this to me. He also did obtain and have the ultrasound of the wound completed which I did sign off on today. It does appear that there is no fluid collection under the muscle this is likely then just edematous tissue in general. That is also good  news. Overall I still believe the inflammation is the main issue here. He did inquire about the possibility of a wound VAC again with the muscle protruding like it is I am not really sure whether the wound VAC is necessarily ideal or not. That is something we will have to consider although I do believe he may need compression wrapping to try to help with edema control which could potentially be of benefit. 06/28/2019 on evaluation today patient appears to be doing slightly better measurement wise although this is not terribly smaller he least seems to be trending towards that direction. With that being said he still seems to have purulent drainage noted in the wound bed at this time. He has been on Levaquin followed by Cipro over the past month. Unfortunately he still seems to have some issues with active infection at this time. I did perform a culture last week in order to evaluate and see if indeed there was still anything going on. Subsequently the culture did come back Pearce, Darnel ([id number]) showing Pseudomonas which is consistent with the drainage has been having which is blue-green in color. He also has had an odor that again was somewhat consistent with Pseudomonas as well. Long story short it appears that the culture showed an intermediate finding with regard to how well the Cipro will work for the Pseudomonas infection. Subsequently being that he does not seem to be clearing up and at best what we are doing is just keeping this at [place] I think he may need to see infectious disease to discuss IV antibiotic options. 07/05/2019 upon evaluation today patient appears to be doing okay in regard to his leg ulcer. He has been tolerating the dressing changes at this point without complication. Fortunately there is no signs of active infection at this time which is good news. No fevers, chills, nausea, vomiting, or diarrhea. With that being said he does have an appointment with infectious disease  tomorrow and his primary care on Wednesday. Again the reason for the infectious disease referral was due to the fact that he did not seem to be fully resolving with the use of oral antibiotics and therefore we were thinking that IV antibiotic therapy may be necessary secondary to the fact that there was an intermediate finding for how effective  the Cipro may be. Nonetheless again he has been having a lot of purulent and even green drainage. Fortunately right now that seems to have calmed down over the past week with the reinitiation of the oral antibiotic. Nonetheless we will see what Dr. Megan Salon has to say. 07/12/2019 upon evaluation today patient appears to be doing about the same at this point in regard to his left lower extremity ulcer. Fortunately there is no signs of active infection at this time which is good news I do believe the Levaquin has been beneficial I did review Dr. Hale Bogus note and to be honest I agree that the patient's leg does appear to be doing better currently. What we found in the past as he does not seem to really completely resolve he will stop the antibiotic and then subsequently things will revert back to having issues with blue-green drainage, increased pain, and overall worsening in general. Obviously that is the reason I sent him back to infectious disease. 07/19/2019 upon evaluation today patient appears to be doing roughly the same in size there is really no dramatic improvement. He has started back on the Levaquin at this point and though he seems to be doing okay he did still have a lot of blue/green drainage noted on evaluation today unfortunately. I think that this is still indicative more likely of a Pseudomonas infection as previously noted and again he does see Dr. Megan Salon in just a couple of days. I do not know that were really able to effectively clear this with just oral antibiotics alone based on what I am seeing currently. Nonetheless we are still continue  to try to manage as best we can with regard to the patient and his wound. I do think the wrap was helpful in decreasing the edema which is excellent news. No fevers, chills, nausea, vomiting, or diarrhea. 07/26/2019 upon evaluation today patient appears to be doing slightly better with regard to the overall appearance of the muscle there is no dark discoloration centrally. Fortunately there is no signs of active infection at this time. No fevers, chills, nausea, vomiting, or diarrhea. Patient's wound bed currently the patient did have an appointment with Dr. Megan Salon at infectious disease last week. With that being said Dr. Megan Salon the patient states was still somewhat hesitant about put him on any IV antibiotics he wanted [place] to repeat cultures today and then see where things go going forward. He does look like Dr. Megan Salon because of some improvement the patient did have with the Levaquin wanted [place] to see about repeating cultures. If it indeed grows the Pseudomonas again then he recommended a possibility of considering a PICC line placement and IV antibiotic therapy. He plans to see the patient back in 1 to 2 weeks. 08/02/2019 upon evaluation today patient appears to be doing poorly with regard to his left lower extremity. We did get the results of his culture back it shows that he is still showing evidence of Pseudomonas which is consistent with the purulent/blue-green drainage that he has currently. Subsequently the culture also shows that he now is showing resistance to the oral fluoroquinolones which is unfortunate as that was really the only thing to treat the infection prior. I do believe that he is looking like this is going require IV antibiotic therapy to get this under control. Fortunately there is no signs of systemic infection at this time which is good news. The patient does see Dr. Megan Salon tomorrow. 08/09/2019 upon evaluation today patient appears to be  doing better with regard to his left  lower extremity ulcer in regard to the overall appearance. He is currently on IV antibiotic therapy. As ordered by Dr. Megan Salon. Currently the patient is on ceftazidime which she is going to take for the next 2 weeks and then follow-up for 4 to 5-week appointment with Dr. Megan Salon. The patient started this this past Friday symptoms have not for a total of 3 days currently in full. 08/16/2019 upon evaluation today patient's wound actually does show muscle in the base of the wound but in general does appear to be much better as far as the overall evidence of infection is concerned. In fact I feel like this is for the most part cleared up he still on the IV antibiotics he has not completed the full course yet but I think he is doing much better which is excellent news. 08/23/2019 upon evaluation today patient appears to be doing about the same with regard to his wound at this point. He tells me that he still has pain unfortunately. Fortunately there is no evidence of systemic infection at this time which is great news. There is significant muscle protrusion. 09/13/19 upon evaluation today patient appears to be doing about the same in regard to his leg unfortunately. He still has a lot of drainage coming from the ulceration there is still muscle exposed. With that being said the patient's last wound culture still showed an intermediate finding with regard to the Pseudomonas he still having the bluish/green drainage as well. Overall I do not know that the wound has completely cleared of infection at this point. Fortunately there is no signs of active infection systemically at this point which is good news. 09/20/2019 upon evaluation today patient's wound actually appears to be doing about the same based on what I am seeing currently. I do not see any signs of systemic infection he still does have evidence of some local infection and drainage. He did see Dr. Megan Salon last week and Dr. Megan Salon states that he  probably does need a different IV antibiotic although he does not want to put him on this until the patient begins the Remicade infusion which is actually scheduled for about 10 days out from today on 13 May. Following that time Dr. Megan Salon is good to see him back and then will evaluate the feasibility of starting him on the IV antibiotic therapy once again at that point. I do not disagree with this plan I do believe as Dr. Megan Salon stated in his note that I reviewed today that the patient's issue is multifactorial with the pyoderma being 1 aspect of this that were hoping the Remicade will be helpful for her. In the meantime I think the gentamicin is, helping to keep things under decent okay control in regard to the ulcer. 09/27/2019 upon evaluation today patient appears to be doing about the same with regard to his wound still there is a lot of muscle exposure though he does have some hyper granulation tissue noted around the edge and actually some granulation tissue starting to form over the muscle which is actually good news. Fortunately there is no evidence of active infection which is also good news. His pain is less at this point. 5/21; this is a patient I have not seen in a long time. 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 He has a large wound on the left lateral leg with protruding muscle. He comes in the clinic today showing the same  area on his left medial ankle. He says there is been a spot there for some time although we have not previously defined this. Today he has a clearly defined area with slight amount of skin breakdown surrounded by raised areas with a purplish hue in color. This is not painful he says it is irritated. 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 He still has muscle protruding from the lateral portion of his left leg but  fortunately the new area that they were concerned about at his last visit does not appear to have opened at this point. He is currently on Remicade infusions and seems to be doing better in my opinion in fact the wound itself seems to be overall much better. The purplish discoloration that he did have seems to have resolved and I think that is a good sign that hopefully the Remicade is doing its job. He does Price, Thelbert ([id number]) have some biofilm noted over the surface of the wound. 11/01/2019 on evaluation today patient's wound actually appears to be doing excellent at this time. Fortunately there is no evidence of active infection and overall I feel like he is making great progress. The Remicade seems to be due excellent job in my opinion. 11/08/19 evaluation today vision actually appears to be doing quite well with regard to his weight ulcer. He's been tolerating dressing changes without complication. Fortunately there is no evidence of infection. No fevers, chills, nausea, or vomiting noted at this time. Overall states that is having more itching than pain which is actually a good sign in my opinion. 12/13/2019 upon evaluation today patient appears to be doing well today with regard to his wound. He has been tolerating the dressing changes without complication. Fortunately there is no sign of active infection at this time. No fevers, chills, nausea, vomiting, or diarrhea. Overall I feel like the infusion therapy has been very beneficial for him. 01/06/2020 on evaluation today patient appears to be doing well with regard to his wound. This is measuring smaller and actually looks to be doing better. Fortunately there is no signs of active infection at this point. No fevers, chills, nausea, vomiting, or diarrhea. With that being said he does still have the blue-green drainage but this does not seem to be causing any significant issues currently. He has been using the gentamicin that does seem to be  keeping things under decent control at this point. He goes later this morning for his next infusion therapy for the pyoderma which seems to also be very beneficial. 02/07/2020 on evaluation today patient appears to be doing about the same in regard to his wounds currently. Fortunately there is no signs of active infection systemically he does still have evidence of local infection still using gentamicin. He also is showing some signs of improvement albeit slowly I do feel like we are making some progress here. 02/21/2020 upon evaluation today patient appears to be making some signs of improvement the wound is measuring a little bit smaller which is great news and overall I am very pleased with where he stands currently. He is going to be having infusion therapy treatment on the 15th of this month. Fortunately there is no signs of active infection at this time. 03/13/2020 I do believe patient's wound is actually showing some signs of improvement here which is great news. He has continue with the infusion therapy through rheumatology/dermatology at [facility]. That does  seem to be beneficial. I still think he gets as much benefit from this as he did from the prednisone initially but nonetheless obviously this is less harsh on his body that the prednisone as far as they are concerned. 03/31/2020 on evaluation today patient's wound actually showing signs of some pretty good improvement in regard to the overall appearance of the wound bed. There is still muscle exposed though he does have some epithelial growth around the edges of the wound. Fortunately there is no signs of active infection at this time. No fevers, chills, nausea, vomiting, or diarrhea. 04/24/2020 upon evaluation today patient appears to be doing about the same in regard to his leg ulcer. He has been tolerating the dressing changes without complication. Fortunately there is no signs of active infection at this time. No fevers, chills, nausea,  vomiting, or diarrhea. With that being said he still has a lot of irritation from the bandaging around the edges of the wound. We did discuss today the possibility of a referral to plastic surgery. 05/22/2020 on evaluation today patient appears to be doing well with regard to his wounds all things considered. He has not been able to get the Chantix apparently there is a recall nurse that I was unaware of put out by [employer] involuntarily. Nonetheless for now I am and I have to do some research into what may be the best option for him to help with quitting in regard to smoking and we discussed that today. 06/26/2020 upon evaluation today patient appears to be doing well with regard to his wound from the standpoint of infection I do not see any signs of infection at this point. With that being said unfortunately he is still continuing to have issues with muscle exposure and again he is not having a whole lot of new skin growth unfortunately. There does not appear to be any signs of active infection at this time. No fevers, chills, nausea, vomiting, or diarrhea. 07/10/2020 upon evaluation today patient appears to be doing a little bit more poorly currently compared to where he was previous. I am concerned currently about an active infection that may be getting worse especially in light of the increased size and tenderness of the wound bed. No fevers, chills, nausea, vomiting, or diarrhea. 07/24/2020 upon evaluation today patient appears to be doing poorly in regard to his leg ulcer. He has been tolerating the dressing changes without complication but unfortunately is having a lot of discomfort. 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 Subsequently I think he is going require possibly IV antibiotics to get this under control. I am very concerned about the severity of his infection and the amount of discomfort he is having. 07/31/2020 upon  evaluation today patient appears to be doing about the same in regard to his leg wound. He did see Dr. Megan Salon and Dr. Megan Salon is actually going to start him on IV antibiotics. He goes for the PICC line tomorrow. With that being said there do not have that run for 2 weeks and then see how things are doing and depending on how he is progressing they may extend that a little longer. Nonetheless I am glad this is getting ready to be in place and definitely feel it may help the patient. In the meantime is been using mainly triamcinolone to the wound bed has an anti-inflammatory. 08/07/2020 on evaluation today patient appears to be doing well with regard to his wound compared even  last week. In the interim he has gotten the PICC line placed and overall this seems to be doing excellent. There does not appear to be any evidence of infection which is great news systemically although locally of course has had the infection this appears to be improving with the use of the antibiotics. 08/14/2020 upon evaluation today patient's wound actually showing signs of excellent improvement. Overall the irritation has significantly improved the drainage is back down to more of a normal level and his pain is really pretty much nonexistent compared to what it was. Obviously I think that this is significantly improved secondary to the IV antibiotic therapy which has made all the difference in the world. Again he had a resistant form of Pseudomonas for which oral antibiotics just was not cutting it. Nonetheless I do think that still we need to consider the possibility of a surgical closure for this wound is been open so long and to be honest with muscle exposed I think this can be very hard to get this to close outside of this although definitely were still working to try to do what we can in that regard. 08/21/2020 upon evaluation today patient appears to be doing very well with regard to his wounds on the left lateral lower  extremity/calf area. Fortunately there does not appear to be signs of active infection which is great news and overall very pleased with where things stand today. He is actually wrapping up his treatment with IV antibiotics tomorrow. After that we will see where things go from there. 08/28/2020 upon evaluation today patient appears to be doing decently well with regard to his leg ulcer. There does not appear to be any signs of Fisch, Edinson ([id number]) active infection which is great news and overall very pleased with where things stand today. No fevers, chills, nausea, vomiting, or diarrhea. 09/18/2020 upon evaluation today patient appears to be doing well with regard to his infection which I feel like is better. Unfortunately he is not doing as well with regard to the overall size of the wound which is not nearly as good at this point. I feel like that he may be having an issue here with the pyoderma being somewhat out of control. I think that he may benefit from potentially going back and talking to the dermatologist about what to do from the pyoderma standpoint. I am not certain if the infusions are helping nearly as much is what the prednisone did in the past. 10/02/2020 upon evaluation today patient appears to be doing well with regard to his leg ulcer. He did go to the [facility] nurse. Unfortunately they feel like there is a 10% chance that most that he would be able to heal and that the skin graft would take. Obviously this has led him to not be able to go down that path as far as treatment is concerned. Nonetheless he does seem to be doing a little bit better with the prednisone that I gave him last time. I think that he may need to discuss with dermatology the possibility of long-term prednisone as that seems to be what is most helpful for him to be perfectly honest. I am not sure the Remicade is really doing the job. 10/17/2020 upon evaluation today patient appears to be doing a little better in  regard to his wound. In fact the case has been since we did the prednisone on May 2 for him that we have noticed a little bit of improvement each time we  have seen a size wise as well as appearance wise as well as pain wise. I think the prednisone has had a greater effect then the infusion therapy has to be perfectly honest. With that being said the patient also feels significantly better compared to what he was previous. All of this is good news but nonetheless I am still concerned about the fact that again we are really not set up to long-term manage him as far as prednisone is concerned. Obviously there are things that you need to be watched I completely understand the risk of prednisone usage as well. 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 With all that being said I do believe that we can give him another round of the prednisone which she is requesting today because of the improvement that he seen since we did that first round. 10/30/2020 upon evaluation today patient's wound actually is showing signs of doing quite well. There does not appear to be any evidence of infection which is great news and overall very pleased with where things stand today. No fevers, chills, nausea, vomiting, or diarrhea. He tells me that the prednisone still has seem to have helped he wonders if we can extend that for just a little bit longer. He did not have the appointment with a dermatologist although he did have an infusion appointment last Friday. That was at [facility]. With that being said he tells me he could not do both that as well as the appointment with the physician on the same day therefore that is can have to be rescheduled. I really want to see if there is anything they feel like that could be done differently to try to help this out as I am not really certain that the infusions are helping significantly here. 11/13/2020 upon evaluation today patient unfortunately  appears to be doing somewhat poorly in regard to his wound I feel like this is actually worsening from the standpoint of the pyoderma spreading. I still feel like that he may need something different as far as trying to manage this going forward. Again we did the prednisone unfortunately his blood sugars are not doing so well because of this. Nonetheless I believe that the patient likely needs to try topical steroid. We have done triamcinolone for a while I think going with something stronger such as clobetasol could be beneficial again this is not something I do lightly I discussed this with the patient that again this does not normally put underneath an occlusive dressing. Nonetheless I think a thin film as such could help with some of the stronger anti-inflammatory effects. We discussed this today. He would like to try to give this a trial for the next couple weeks. I definitely think that is something that we can do. Evaluate7/03/2021 and today patient's wound bed actually showed signs of doing really about the same. There was a little expansion of the size of the wound and that leading edge that we done looking out although the clobetasol does seem to have slowed this down a bit in my opinion. There is just 1 small area that still seems to be progressing based on what I see. Nonetheless I am concerned about the fact this does not seem to be improving if anything seems to be doing a little bit worse. I do not know that the infusions are really helping him much as next infusion is August 5 his appointment with dermatology is July  25. Either way I really think that we need to have a conversation potentially about this and I am actually going to see if I can talk with Dr. Lillia Carmel in order to see where things stand as well. 12/11/2020 upon evaluation today patient appears to be doing worse in regard to his leg ulcer. Unfortunately I just do not think this is making the progress that I would like to  see at this point. Honestly he does have an appointment with dermatology and this is in 2 days. I am wondering what they may have to offer to help with this. Right now what I am seeing is that he is continuing to show signs of worsening little by little. Obviously that is not great at all. Is the exact opposite of what we are looking for. 12/18/2020 upon evaluation today patient appears to be doing a little better in regard to his wound. The dermatologist actually did do some steroid injections into the wound which does seem to have been beneficial in my opinion. That was on the 25th already this looks a little better to me than last time I saw him. With that being said we did do a culture and this did show that he has Staph aureus noted in abundance in the wound. With that being said I do think that getting him on an oral antibiotic would be appropriate as well. Also think we can compression wrap and this will make a difference as well. 12/28/2020 upon evaluation today patient's wound is actually showing signs of doing much better. I do believe the compression wrap is helping he has a lot of drainage but to be honest I think that the compression is helping to some degree in this regard as well as not draining through which is also good news. No fevers, chills, nausea, vomiting, or diarrhea. 01/04/2021 upon evaluation today patient appears to be doing well with regard to his wound. Overall things seem to be doing quite well. He did have a little bit of reaction to the CarboFlex Sorbact he will be using that any longer. With that being said he is controlled as far as the drainage is concerned overall and seems to be doing quite well. I do not see any signs of active infection at this time which is great news. No fevers, chills, nausea, vomiting, or diarrhea. 01/11/2021 upon evaluation today patient appears to be doing well with regard to his wounds. He has been tolerating the dressing changes  without complication. Fortunately there does not appear to be any signs of active infection at this time which is great news. Overall I am extremely pleased with where we stand currently. No fevers, chills, nausea, vomiting, or diarrhea. Where using clobetasol in the wound bed he has a lot of new skin growth which is awesome as well. 01/18/2021 upon evaluation today patient appears to be doing very well in regard to his leg ulcer. He has been tolerating the dressing changes without complication. Fortunately there does not appear to be any signs of active infection which is great news. In general I think that he is making excellent progress 01/25/2021 upon evaluation today patient appears to be doing well with regard to his wound on the leg. I am actually extremely pleased with where things stand today. There does not appear to be any signs of active infection which is great news and overall I think that we are definitely headed in the appropriate direction based on what I am seeing currently. There  does not appear to be any signs of active infection also excellent news. 02/06/2021 upon evaluation today patient appears to be doing well with regard to his wound. Overall visually this is showing signs of significant Cleland, Kishan ([id number]) improvement which is great news. I do not see any signs of active infection systemically which is great even locally I do not think that we are seeing any major complications here. We did do fluorescence imaging with the [employer] today. The patient does have some odor and drainage noted and again this is something that I think would benefit him to probably come more frequently for nurse visits. 02/19/2021 upon evaluation today patient actually appears to be doing quite well in regard to his wound. He has been tolerating the dressing changes without complication and overall I think that this is making excellent progress. I do not see any evidence of active infection  at this point which is great news as well. No fevers, chills, nausea, vomiting, or diarrhea. 10/10; wound is made nice progress healthy granulation with a nice rim of epithelialization which seems to be expanding even from last week he has a deeper area in the inferior part of the more distal part of the wound with not quite as healthy as surface. This area will need to be followed. Using clobetasol and Hydrofera Blue 03/05/2021 upon evaluation today patient appears to be doing very well in regard to his leg ulcer. He has been tolerating dressing changes without complication. Fortunately there does not appear to be any signs of active infection which is great news and overall I am extremely pleased with where we stand currently. 03/12/2021 upon evaluation today patient appears to be doing well with regard to his wound in fact this is extremely extremely good based on what we are seeing today there does not appear to be any signs of active infection and overall I think that he is doing awesome from the standpoint of healing in general. I am extremely pleased with how things seem to be progressing with regard to this pyoderma. Clobetasol has done wonders for him. I think the compression wrapping has also been of great benefit. 03/19/2021 upon evaluation today patient appears to be doing well with regard to his wound. He is tolerating the dressing changes without complication. In fact I feel like that he is actually making excellent progress at this point based on what I am seeing. No fevers, chills, nausea, vomiting, or diarrhea. 03/26/2021 upon evaluation today patient appears to be doing well with regard to his wound. This again is measuring smaller and looking better. Again the progress is slow but nonetheless continual with what we have been seeing. I do believe that the current plan is doing awesome for him. 04/09/2021 upon evaluation today patient appears to be doing well with regard to his leg  ulcer. This is showing signs of excellent improvement the muscle is completely closed over and there does not appear to be any evidence of inflammation at this point his drainage is significantly improved. Overall I think that he would be a good candidate for looking into a skin substitute at this point as well. We will get a look into some approvals in that regard. Potentially TheraSkin as well as Apligraf could both be considered just depending on insurance coverage. 04/16/2021 upon evaluation today patient appears to be doing well at this point. He has been approved for the Apligraf which we could definitely order although I would like to try to get  the TheraSkin approved if at all possible. I did fax notes into them today and I Georgina Peer try to give a call as well. Overall the wound appears to be doing decently well today. 04/20/2021 upon evaluation today patient actually appears to be doing quite well in regard to his wound with that being said we are trying to see what we can do about speeding up the healing process. For that reason we did discuss the possibility of a skin substitute. We got the Apligraf approved. For that reason we will get a go ahead and see what we can do with the Apligraf at this point. I am still trying to get the TheraSkin approved but I have not heard anything from the insurance company yet I have called and talked to them earlier in the week and to be honest this was about half an hour that I spent on the phone they told me I would hear something within 10-15 business days. 04/27/2021 upon evaluation today patient appears to be doing excellent in regard to his wound. I do believe the Apligraf has been beneficial. With that being said he has had a little bit of increased pain has been a little bit concerned. I do want to go ahead and apply his steroid cream today the clobetasol and then put the Apligraf over I just do not think I want to risk not putting the clobetasol on based  on what is been going on here currently. Patient voiced understanding. 05/04/2021 upon evaluation today patient is making excellent improvement. Overall there is definitely decrease in the size of the wound today and I am very pleased in that regard. I do not see any signs of active infection locally nor systemically at this point. No fevers, chills, nausea, vomiting, or diarrhea. 05/11/2021 upon evaluation today patient appears to be doing well with regard to his wound. He has been tolerating Apligraf's without complication and is making excellent progress this is application #4 today. 12/30; patient here for Apligraf application. Appears to be doing well small wound there has been a lot of healing 05/28/2021 upon evaluation today patient appears to be doing well with regard to his wound. Has been tolerating the dressing changes without complication. Fortunately I do not see any signs of active infection locally nor systemically at this point which is great news. He is done with the Apligraf and the first round and overall this has filled in quite nicely I am not even certain he needs any additional at the moment. I would actually try to go back to clobetasol with [facility] which previously was doing well for him. 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 He is using clobetasol and Hydrofera Blue. Surface area of the wound is smaller and the surface looks very healthy. 06/11/2021 upon evaluation today patient actually appears to be showing signs of good improvement which is great news. Fortunately I do not see any signs of active infection at this time which is also excellent news. I do believe that the patient is doing well with the clobetasol and Hydrofera Blue. He does have some irritation and itching around the rest of the leg will use some triamcinolone over this area. 06/18/2021 upon evaluation today patient appears to be doing well with  regard to his wound. He is showing signs of excellent improvement and overall I am extremely pleased with where we stand today. We are moving in the right direction. 06/25/2021 upon evaluation today  patient appears to be doing well with regard to his wound. In fact this is doing also not showing signs of excellent improvement overall very pleased with where we stand today. I do not see any evidence of active infection locally or systemically at this time which is also great news. 07/02/2021 upon evaluation patient's wound bed actually showed signs of good granulation and epithelization at this point. And this is measuring significantly smaller and overall I am extremely pleased with where we stand today. No fevers, chills, nausea, vomiting, or diarrhea. 07/09/2021 upon evaluation patient's wound bed actually showed signs of good granulation and epithelization at this point. Fortunately there does not appear to be any evidence of active infection locally or systemically which is great news and overall I am extremely pleased with where we IFEANYICHUKWU, WICKHAM ([id number]) stand at this point. 07/16/2021 upon evaluation today patient appears to be doing well with regard to his wound all things considered although it is maybe a little bit larger than what its been. This does have me a little concerned. Actually question about whether anything is changed and initially he told me know. In fact it was not until the end of the visit that he actually did mention that he had missed his infusion in January. This very well may be what is because the difference is also seeing currently. That definitely has seem to been helping more recently. 07/23/2021 upon evaluation today patient's wound is yet again larger despite the switch to a collagen which was not beneficial. Subsequently I am going to at this point check on a couple things. First and foremost I do want to go ahead and do a culture. I think that there is a chance he  could have a low-lying infection that may be part of the issue here. Subsequently I am also probably can go ahead and place him on Bactrim DS which I think is a good option and has done well with them in the past. Again if this is an infection that should hopefully help to turn things around. With all that being said I do believe that the patient should hopefully be able to turn this around with reinitiation of the infusion therapy which will be going on this week and subsequently getting on the antibiotic. 3/13; patient presents for follow-up. He has no issues or complaints today. He states he is currently on oral antibiotics for previous culture done in the office. 08/06/2021 upon evaluation today patient appears to be doing well with regard to his wound. I am actually seeing signs of improvement which is great news. I think that he is on a better track he did receive his infusion as well which is great news and overall I think that this should hopefully get him back on track as far as healing is concerned. He is not having any pain and I do not see any signs of inflammation which is great news. 08/13/2021 upon evaluation today patient appears to be doing excellent in regard to his wound on the leg. He has been tolerating the dressing changes without complication. Fortunately I do not see any signs of infection I do believe he is making good progress here and I think her back on track overall the tissue is greatly improved compared to what we have been. 08/20/2021 upon evaluation today patient appears to be doing excellent in regard to his wound. He has been tolerating the dressing changes without complication. Fortunately I do not see any evidence of infection at  this time locally or systemically which is great news and overall very pleased with where we stand. 08-31-2021 upon evaluation today patient appears to be doing excellent in regard to his wound. Overall even with the vacation and extra  walking he seems to have really done well. Fortunately I do not see any evidence of active infection locally or systemically which is great news and overall I think we are headed in the right direction. 09-10-2021 upon evaluation today patient's wound is showing signs of improvement. This is a small improvement but nonetheless we seem to be making progress here. I am very pleased in that regard. There does not appear to be any signs of infection and overall things are doing quite nicely. Upon inspection patient's wound bed actually showed signs of good granulation and epithelization at this point. There was some irritation around the edges of the wound where the good skin was it almost appears that something was rubbing or else is was due from drainage and irritation. Nonetheless I am going to see about putting a little bit of zinc just around the wound opening in order to protect the skin from being damaged. The patient is in agreement with that plan. 5/8; its been a while since I have seen this wound and it looks considerably better although still with some depth. Per our intake nurse the dimensions have improved patient has been using a thin layer of clobetasol, Hydrofera Blue under 4-layer compression. He appears to be making progress 10-01-2021 upon evaluation today patient appears to be doing well with regard to his wound although it is somewhat stalled I feel like were basically at a standstill here. He has previously had Apligraf which did very well for him to be honest. I do believe that he may benefit from a repeat of the Apligraf. In fact it got so small at that point but I do not think we even used all of the applications that were recommended as this had improved to such a degree. Nonetheless I do believe that the patient currently could benefit from repeat treatment with the Apligraf due to the improvement this that we did see. He is not opposed to this and subsequently working to look  into getting approval to reinstitute that treatment. 10-08-2021 upon evaluation today patient appears to be doing well with regard to his wound. Overall I feel like he is doing a little bit better from the standpoint of irritation at this time which is great news. I do not see any signs of active infection locally or systemically which is great news as well. No fevers, chills, nausea, vomiting, or diarrhea. 10-12-2021 upon evaluation today patient appears to be doing well with regard to his wound stable although we are can initiate treatment with Apligraf today. I am hoping this will stimulate things to improve. Patient is in agreement with that plan. 10-19-2028 upon evaluation today patient appears to be doing well with regard to his wound. He has been tolerating the dressing changes without complication. Fortunately there does not appear to be any evidence of active infection locally or systemically at this time which is great news. No fevers, chills, nausea, vomiting, or diarrhea. I do believe the Apligraf has improved the overall appearance of the wound bed receiving some definite improvements here. 10-26-2021 upon evaluation today patient's wound is actually shown some signs of really good improvement I am very pleased in this regard. There is a little bit of slough and biofilm buildup that is stuck to  the surface of the wound I am getting very lightly debrided in order to clear this away and then subsequently will get a continue with the Apligraf application today. This is #3. 11-09-2021 upon evaluation today patient appears to be doing well currently in regard to his wound. This is actually showing signs of significant improvement which is great news and overall I think the Apligraf is doing an amazing job. This wound is significantly smaller compared to the last time I saw him. This will be Apligraf application #4. 12-20-5051 upon evaluation today patient appears to be doing well currently in regard  to his wound he is actually showing signs of excellent improvement which is great news and overall I am extremely pleased with where we stand today. I do not see any evidence of active infection locally or systemically at this time which is great news. No fevers, chills, nausea, vomiting, or diarrhea. I believe the Apligraf is doing an awesome job and in fact I [place] see about getting an extension of application which hopefully should not be a problem. Especially in light of how much improvement he has had with regards to the size of the wound with treatment. This is application #5 today. NAZIM, KADLEC ([id number]) 12-07-2021 upon evaluation today patient appears to be doing excellent in regard to his wound has been tolerating the dressing changes without complication. Fortunately there does not appear to be any signs of active infection locally or systemically at this time. 12-21-2021 upon evaluation today patient appears to be doing well currently in regard to his wound. We have gotten approval for a repeat Apligraf treatment. I think that is going to be a good way to wrap this up I really feel like we will likely be able to get the wound closed with the additional treatments that have been improved. Objective Constitutional Well-nourished and well-hydrated in no acute distress. Vitals Time Taken: 8:11 AM, Height: 71 in, Weight: 338 lbs, BMI: 47.1, Temperature: 98.3 F, Pulse: 87 bpm, Respiratory Rate: 16 breaths/min, Blood Pressure: 121/82 mmHg. Respiratory normal breathing without difficulty. Psychiatric this patient is able to make decisions and demonstrates good insight into disease process. Alert and Oriented x 3. pleasant and cooperative. General Notes: Upon inspection patient's wound did not require any debridement whatsoever and appears to be doing excellent is very clean and I am extremely pleased in this regard. I am glad that we did get the repeat Apligraf that was applied today and  the patient tolerated this without complications. Integumentary (Hair, Skin) Wound #1 status is Open. Original cause of wound was Gradually Appeared. The date acquired was: 11/18/2015. The wound has been in treatment 263 weeks. The wound is located on the Left,Lateral Lower Leg. The wound measures 2.4cm length x 1.1cm width x 0.1cm depth; 2.073cm^2 area and 0.207cm^3 volume. There is a medium amount of serosanguineous drainage noted. There is large (67-100%) red granulation within the wound bed. There is a small (1-33%) amount of necrotic tissue within the wound bed including Adherent Slough. 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 A skin graft procedure using a bioengineered skin substitute/cellular or tissue based product was performed by Tommie Sams., PA-C with the following instrument(s): Blade. Apligraf was applied and secured with  Steri-Strips. 11 sq cm of product was utilized and 33 sq cm was wasted due to wound size. Post Application, bolus was applied. A Time Out was conducted at 08:34, prior to the start of the procedure. The procedure was tolerated well with a pain level of 0 throughout and a pain level of 0 following the procedure. Post procedure Diagnosis Wound #1: Same as Pre-Procedure . Pre-procedure diagnosis of Wound #1 is a Pyoderma located on the Left,Lateral Lower Leg . There was a Four Layer Compression Therapy Procedure with a pre-treatment ABI of 1.2 by [name], RN. Post procedure Diagnosis Wound #1: Same as Pre-Procedure AVISHAI, REIHL ([id number]) Plan Follow-up Appointments: Return Appointment in 1 week. Nurse Visit  as needed - twice a week Bathing/ Shower/ Hygiene: Clean wound with Normal Saline or wound cleanser. May shower with wound dressing protected with water repellent cover or cast protector. No tub bath. Cellular or Tissue Based Products: Wound #1 Left,Lateral Lower Leg: Cellular or Tissue Based Product applied to wound bed; including contact layer, fixation with steri-strips, dry gauze and cover dressing. (DO NOT REMOVE). - Apligraf #6 applied Edema Control - Lymphedema / Segmental Compressive Device / Other: Optional: One layer of unna paste to top of compression wrap (to act as an anchor). - He needs this to hold it up Elevate, Exercise Daily and Avoid Standing for Long Periods of Time. Elevate legs to the level of the heart and pump ankles as often as possible Elevate leg(s) parallel to the floor when sitting. WOUND #1: - Lower Leg Wound Laterality: Left, Lateral Cleanser: Wound Cleanser 3 x Per Week/30 Days Discharge Instructions: Wash your hands with soap and water. Remove old dressing, discard into plastic bag and place into trash. Cleanse the wound with Wound Cleanser prior to applying a clean dressing using gauze sponges, not tissues or cotton balls. Do not scrub or use excessive force. Pat dry using gauze sponges, not tissue or cotton balls. Peri-Wound Care: Desitin Maximum Strength Ointment 4 (oz) 3 x Per Week/30 Days Discharge Instructions: Apply around wound edges to avoid maceration Peri-Wound Care: Triamcinolone Acetonide Cream, 0.1%, 15 (g) tube 3 x Per Week/30 Days Discharge Instructions: Apply to leg Topical: Clobetasol Propionate ointment 0.05%, 60 (g) tube 3 x Per Week/30 Days Discharge Instructions: Pt to bring to appt- applied to wound bed Primary Dressing: Apligraf 3 x Per Week/30 Days Secondary Dressing: (NON-BORDER) Zetuvit Plus Silicone NON-BORDER 5x5 (in/in) 3 x Per Week/30 Days Compression Wrap: Medichoice 4 layer Compression System, 35-40 mmHG 3 x Per Week/30  Days Discharge Instructions: Apply multi-layer wrap as directed. 1. I would recommend currently that we going continue with wound care measures as before and the patient is in agreement with plan this includes the use of the Apligraf treatment which I did go ahead and reapply today. 2. I am also can recommend at this time that we have the patient continue to monitor for any signs of infection. Obviously we will be checking him 3 times a week to change the outer dressings obviously the Apligraf will stay in place during that time. We will see patient back for reevaluation in 2 weeks here in the clinic. If anything worsens or changes patient will contact our office for additional recommendations. Electronic Signature(s) Signed: 12/21/2021 9:48:42 AM By: Worthy Keeler PA-C Entered By: Worthy Keeler on 12/21/2021 09:48:41 DOMINIQUE, CALVEY ([id number]) -------------------------------------------------------------------------------- SuperBill Details Patient Name: [name] Date of Service: 12/21/2021 Medical Record Number: [id number] Patient Account Number: [id number] Date of Birth/Sex:  1980-08-02 (41 y.o. M) Treating RN: [name] Primary Care Provider: [name] Other Clinician: Massie Kluver Referring Provider: [name] Treating Provider/Extender: [name] Cliche in Treatment: 263 Diagnosis Coding ICD-10 Codes Code Description I87.2 Venous insufficiency (chronic) (peripheral) L97.222 Non-pressure chronic ulcer of left calf with fat layer exposed E11.622 Type 2 diabetes mellitus with other skin ulcer L88 Pyoderma gangrenosum F17.208 Nicotine dependence, unspecified, with other nicotine-induced disorders Facility Procedures CPT4 Code: 11155208 Description: [id number] (Facility Use Only) Apligraf 44 SQ CM Modifier: Quantity: [place] Code: 61224497 Description: 53005 - SKIN SUB GRAFT TRNK/ARM/LEG Modifier: Quantity: 1 CPT4 Code: Description: ICD-10 Diagnosis Description  L97.222 Non-pressure chronic ulcer of left calf with fat layer exposed Modifier: Quantity: Physician Procedures CPT4 Code: 1102111 Description: 73567 - WC PHYS SKIN SUB GRAFT TRNK/ARM/LEG Modifier: Quantity: 1 CPT4 Code: Description: ICD-10 Diagnosis Description L97.222 Non-pressure chronic ulcer of left calf with fat layer exposed Modifier: Quantity: Electronic Signature(s) Signed: 12/21/2021 9:48:52 AM By: Worthy Keeler PA-C Entered By: Worthy Keeler on 12/21/2021 09:48:52

## 2021-12-26 DIAGNOSIS — E11622 Type 2 diabetes mellitus with other skin ulcer: Secondary | ICD-10-CM | POA: Diagnosis not present

## 2021-12-26 NOTE — Unmapped (Signed)
Call from Taia with [facility].  States the orders have expired for the Avsola and patient is due for infusion next Tuesday 01/01/22. May call her if any questions at [phone].

## 2021-12-26 NOTE — Progress Notes (Signed)
[name], [name] ([id number]) Visit Report for 12/26/2021 Arrival Information Details Patient Name: [name], [name] Date of Service: 12/26/2021 8:00 AM Medical Record Number: [id number] Patient Account Number: [id number] Date of Birth/Sex: [birth year] (41 y.o. M) Treating RN: Levora Dredge Primary Care Shanica Castellanos: [name] Other Clinician: Referring Linzie Boursiquot: [name] Treating Azaryah Oleksy/Extender: Yaakov Guthrie in Treatment: 20 Visit Information History Since Last Visit Added or deleted any medications: No Patient Arrived: Ambulatory Any new allergies or adverse reactions: No Arrival Time: 08:00 Had a fall or experienced change in No Accompanied By: self activities of daily living that may affect Transfer Assistance: None risk of falls: Patient Identification Verified: Yes Hospitalized since last visit: No Secondary Verification Process Completed: Yes Has Dressing in Place as Prescribed: Yes Patient Requires Transmission-Based No Has Compression in Place as Prescribed: Yes Precautions: Pain Present Now: No Patient Has Alerts: Yes Patient Alerts: Patient has reaction to silver dressings. Electronic Signature(s) Signed: 12/26/2021 3:54:56 PM By: Levora Dredge Entered By: Levora Dredge on 12/26/2021 08:00:40 [name] ([id number]) -------------------------------------------------------------------------------- Clinic Level of Care Assessment Details Patient Name: [name], [name] Date of Service: 12/26/2021 8:00 AM Medical Record Number: [id number] Patient Account Number: [id number] Date of Birth/Sex: 11/04/1980 (40 y.o. M) Treating RN: Levora Dredge Primary Care Yanette Tripoli: [name] Other Clinician: Referring Daronte [name]: [name] Treating Lagretta Loseke/Extender: Yaakov Guthrie in Treatment: 264 Clinic Level of Care Assessment Items TOOL 1 Quantity Score []  - Use when EandM and Procedure is performed on INITIAL visit 0 ASSESSMENTS - Nursing  Assessment / Reassessment []  - General Physical Exam (combine w/ comprehensive assessment (listed just below) when performed on new 0 pt. evals) []  - 0 Comprehensive Assessment (HX, ROS, Risk Assessments, Wounds Hx, etc.) ASSESSMENTS - Wound and Skin Assessment / Reassessment []  - Dermatologic / Skin Assessment (not related to wound area) 0 ASSESSMENTS - Ostomy and/or Continence Assessment and Care []  - Incontinence Assessment and Management 0 []  - 0 Ostomy Care Assessment and Management (repouching, etc.) PROCESS - Coordination of Care []  - Simple Patient / Family Education for ongoing care 0 []  - 0 Complex (extensive) Patient / Family Education for ongoing care []  - 0 Staff obtains Programmer, systems, Records, Test Results / Process Orders []  - 0 Staff telephones HHA, Nursing Homes / Clarify orders / etc []  - 0 Routine Transfer to another Facility (non-emergent condition) []  - 0 Routine Hospital Admission (non-emergent condition) []  - 0 New Admissions / Biomedical engineer / Ordering NPWT, Apligraf, etc. []  - 0 Emergency Hospital Admission (emergent condition) PROCESS - Special Needs []  - Pediatric / Minor Patient Management 0 []  - 0 Isolation Patient Management []  - 0 Hearing / Language / Visual special needs []  - 0 Assessment of Community assistance (transportation, D/C planning, etc.) []  - 0 Additional assistance / Altered mentation []  - 0 Support Surface(s) Assessment (bed, cushion, seat, etc.) INTERVENTIONS - Miscellaneous []  - External ear exam 0 []  - 0 Patient Transfer (multiple staff / Civil Service fast streamer / Similar devices) []  - 0 Simple Staple / Suture removal (25 or less) []  - 0 Complex Staple / Suture removal (26 or more) []  - 0 Hypo/Hyperglycemic Management (do not check if billed separately) []  - 0 Ankle / Brachial Index (ABI) - do not check if billed separately Has the patient been seen at the hospital within the last three years: Yes Total Score: 0 Level Of  Care: ____ [name] ([id number]) Electronic Signature(s) Signed: 12/26/2021 3:54:56 PM By: Levora Dredge Entered By: Levora Dredge on 12/26/2021 08:22:56 Mcmurtry, Ryatt ([id number]) --------------------------------------------------------------------------------  Compression Therapy Details Patient Name: [name], [name] Date of Service: 12/26/2021 8:00 AM Medical Record Number: [id number] Patient Account Number: [id number] Date of Birth/Sex: 25-Apr-1981 (40 y.o. M) Treating RN: Levora Dredge Primary Care Shandale Malak: [name] Other Clinician: Referring Raeven Pint: [name] Treating Derinda Bartus/Extender: Yaakov Guthrie in Treatment: 264 Compression Therapy Performed for Wound Assessment: Wound #1 Left,Lateral Lower Leg Performed By: Clinician Levora Dredge, RN Compression Type: Four Layer Notes pt tolerated wrap without issue Electronic Signature(s) Signed: 12/26/2021 3:54:56 PM By: Levora Dredge Entered By: Levora Dredge on 12/26/2021 08:02:16 KEYLON, LABELLE ([id number]) -------------------------------------------------------------------------------- Encounter Discharge Information Details Patient Name: [name] Date of Service: 12/26/2021 8:00 AM Medical Record Number: [id number] Patient Account Number: [id number] Date of Birth/Sex: [birth year] (40 y.o. M) Treating RN: Levora Dredge Primary Care Rosela Supak: [name] Other Clinician: Referring Zeyad Delaguila: [name] Treating Jaymin Waln/Extender: Yaakov Guthrie in Treatment: 264 Encounter Discharge Information Items Discharge Condition: Stable Ambulatory Status: Ambulatory Discharge Destination: Home Transportation: Private Auto Accompanied By: self Schedule Follow-up Appointment: Yes Clinical Summary of Care: Electronic Signature(s) Signed: 12/26/2021 3:54:56 PM By: Levora Dredge Entered By: Levora Dredge on 12/26/2021 08:22:49 [name]  ([id number]) -------------------------------------------------------------------------------- Wound Assessment Details Patient Name: [name] Date of Service: 12/26/2021 8:00 AM Medical Record Number: [id number] Patient Account Number: [id number] Date of Birth/Sex: 1981-02-11 (40 y.o. M) Treating RN: Levora Dredge Primary Care Minerva Bluett: [name] Other Clinician: Referring Natisha Trzcinski: [name] Treating Tyyne Cliett/Extender: Yaakov Guthrie in Treatment: 264 Wound Status Wound Number: 1 Primary Etiology: Pyoderma Wound Location: Left, Lateral Lower Leg Wound Status: Open Wounding Event: Gradually Appeared Comorbid History: Sleep Apnea, Hypertension, Colitis Date Acquired: 11/18/2015 Weeks Of Treatment: 264 Clustered Wound: No Wound Measurements Length: (cm) 2.4 Width: (cm) 1.1 Depth: (cm) 0.1 Area: (cm) 2.073 Volume: (cm) 0.207 % Reduction in Area: 57.8% % Reduction in Volume: 94.7% Epithelialization: Medium (34-66%) Tunneling: No Undermining: No Wound Description Classification: Full Thickness With Exposed Support Structures Exudate Amount: Medium Exudate Type: Serosanguineous Exudate Color: red, brown Foul Odor After Cleansing: No Slough/Fibrino No Wound Bed Granulation Amount: Large (67-100%) Exposed Structure Granulation Quality: Red Fascia Exposed: No Necrotic Amount: Small (1-33%) Fat Layer (Subcutaneous Tissue) Exposed: No Necrotic Quality: Adherent Slough Tendon Exposed: No Muscle Exposed: No Joint Exposed: No Bone Exposed: No Treatment Notes Wound #1 (Lower Leg) Wound Laterality: Left, Lateral Cleanser Wound Cleanser Discharge Instruction: Wash your hands with soap and water. Remove old dressing, discard into plastic bag and place into trash. Cleanse the wound with Wound Cleanser prior to applying a clean dressing using gauze sponges, not tissues or cotton balls. Do not scrub or use excessive force. Pat dry using gauze sponges, not  tissue or cotton balls. Peri-Wound Care Desitin Maximum Strength Ointment 4 (oz) Discharge Instruction: Apply around wound edges to avoid maceration Triamcinolone Acetonide Cream, 0.1%, 15 (g) tube Discharge Instruction: Apply to leg Topical Clobetasol Propionate ointment 0.05%, 60 (g) tube Discharge Instruction: Pt to bring to appt- applied to wound bed Primary Dressing Apligraf Secondary Dressing (NON-BORDER) Zetuvit Plus Silicone NON-BORDER 5x5 (in/in) Jesson, Nicandro ([id number]) Secured With Compression Wrap Medichoice 4 layer Compression System, 35-40 mmHG Discharge Instruction: Apply multi-layer wrap as directed. Compression Stockings Add-Ons Electronic Signature(s) Signed: 12/26/2021 3:54:56 PM By: Levora Dredge Entered By: Levora Dredge on 12/26/2021 08:01:01

## 2021-12-26 NOTE — Progress Notes (Signed)
[name], [name] ([id number]) Visit Report for 12/26/2021 Physician Orders Details Patient Name: [name], [name] Date of Service: 12/26/2021 8:00 AM Medical Record Number: [id number] Patient Account Number: [id number] Date of Birth/Sex: 09-28-1980 (41 y.o. M) Treating RN: Levora Dredge Primary Care Provider: [name] Other Clinician: Referring Provider: [name] Treating Provider/Extender: Yaakov Guthrie in Treatment: [phone] Verbal / Phone Orders: No Diagnosis Coding Follow-up Appointments o Return Appointment in 1 week. o Nurse Visit as needed - twice a week Bathing/ Shower/ Hygiene o Clean wound with Normal Saline or wound cleanser. o May shower with wound dressing protected with water repellent cover or cast protector. o No tub bath. Cellular or Tissue Based Products Wound #1 Left,Lateral Lower Leg o Cellular or Tissue Based Product applied to wound bed; including contact layer, fixation with steri-strips, dry gauze and cover dressing. (DO NOT REMOVE). - Apligraf #6 applied Edema Control - Lymphedema / Segmental Compressive Device / Other o Optional: One layer of unna paste to top of compression wrap (to act as an anchor). - He needs this to hold it up o Elevate, Exercise Daily and Avoid Standing for Long Periods of Time. o Elevate legs to the level of the heart and pump ankles as often as possible o Elevate leg(s) parallel to the floor when sitting. Wound Treatment Wound #1 - Lower Leg Wound Laterality: Left, Lateral Cleanser: Wound Cleanser 3 x Per Week/30 Days Discharge Instructions: Wash your hands with soap and water. Remove old dressing, discard into plastic bag and place into trash. Cleanse the wound with Wound Cleanser prior to applying a clean dressing using gauze sponges, not tissues or cotton balls. Do not scrub or use excessive force. Pat dry using gauze sponges, not tissue or cotton balls. Peri-Wound Care: Desitin Maximum Strength  Ointment 4 (oz) 3 x Per Week/30 Days Discharge Instructions: Apply around wound edges to avoid maceration Peri-Wound Care: Triamcinolone Acetonide Cream, 0.1%, 15 (g) tube 3 x Per Week/30 Days Discharge Instructions: Apply to leg Topical: Clobetasol Propionate ointment 0.05%, 60 (g) tube 3 x Per Week/30 Days Discharge Instructions: Pt to bring to appt- applied to wound bed Primary Dressing: Apligraf 3 x Per Week/30 Days Secondary Dressing: (NON-BORDER) Zetuvit Plus Silicone NON-BORDER 5x5 (in/in) 3 x Per Week/30 Days Compression Wrap: Medichoice 4 layer Compression System, 35-40 mmHG 3 x Per Week/30 Days Discharge Instructions: Apply multi-layer wrap as directed. Electronic Signature(s) Signed: 12/26/2021 9:25:36 AM By: Kalman Shan DO Signed: 12/26/2021 3:54:56 PM By: Levora Dredge Entered By: Levora Dredge on 12/26/2021 08:20:04 WALI, REINHEIMER ([id number]) -------------------------------------------------------------------------------- SuperBill Details Patient Name: [name] Date of Service: 12/26/2021 Medical Record Number: [id number] Patient Account Number: [id number] Date of Birth/Sex: 28-Oct-1980 (41 y.o. 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

## 2021-12-28 DIAGNOSIS — E11622 Type 2 diabetes mellitus with other skin ulcer: Secondary | ICD-10-CM | POA: Diagnosis not present

## 2021-12-28 NOTE — Unmapped (Signed)
Infliximab orders completed, scanned to chart and placed in provider's mailbox ([place]) for review and signature.

## 2021-12-28 NOTE — Progress Notes (Signed)
[name] ([id number]) Visit Report for 12/28/2021 Physician Orders Details Patient Name: [name], [name] Date of Service: 12/28/2021 8:15 AM Medical Record Number: [id number] Patient Account Number: [id number] Date of Birth/Sex: October 21, 1980 (41 y.o. M) Treating RN: [name] Primary Care Provider: [name] Other Clinician: Massie Kluver Referring Provider: [name] Treating Provider/Extender: [name] Cliche in Treatment: [phone] Verbal / Phone Orders: No Diagnosis Coding Follow-up Appointments o Return Appointment in 1 week. o Nurse Visit as needed - twice a week Bathing/ Shower/ Hygiene o Clean wound with Normal Saline or wound cleanser. o May shower with wound dressing protected with water repellent cover or cast protector. o No tub bath. Cellular or Tissue Based Products Wound #1 Left,Lateral Lower Leg o Cellular or Tissue Based Product applied to wound bed; including contact layer, fixation with steri-strips, dry gauze and cover dressing. (DO NOT REMOVE). - Apligraf #6 applied Edema Control - Lymphedema / Segmental Compressive Device / Other o Optional: One layer of unna paste to top of compression wrap (to act as an anchor). - He needs this to hold it up o Elevate, Exercise Daily and Avoid Standing for Long Periods of Time. o Elevate legs to the level of the heart and pump ankles as often as possible o Elevate leg(s) parallel to the floor when sitting. Wound Treatment Wound #1 - Lower Leg Wound Laterality: Left, Lateral Cleanser: Wound Cleanser 3 x Per Week/30 Days Discharge Instructions: Wash your hands with soap and water. Remove old dressing, discard into plastic bag and place into trash. Cleanse the wound with Wound Cleanser prior to applying a clean dressing using gauze sponges, not tissues or cotton balls. Do not scrub or use excessive force. Pat dry using gauze sponges, not tissue or cotton balls. Peri-Wound Care: Desitin Maximum  Strength Ointment 4 (oz) 3 x Per Week/30 Days Discharge Instructions: Apply around wound edges to avoid maceration Peri-Wound Care: Triamcinolone Acetonide Cream, 0.1%, 15 (g) tube 3 x Per Week/30 Days Discharge Instructions: Apply to leg Topical: Clobetasol Propionate ointment 0.05%, 60 (g) tube 3 x Per Week/30 Days Discharge Instructions: Pt to bring to appt- applied to wound bed Primary Dressing: Apligraf 3 x Per Week/30 Days Secondary Dressing: (NON-BORDER) Zetuvit Plus Silicone NON-BORDER 5x5 (in/in) 3 x Per Week/30 Days Compression Wrap: Medichoice 4 layer Compression System, 35-40 mmHG 3 x Per Week/30 Days Discharge Instructions: Apply multi-layer wrap as directed. Electronic Signature(s) Signed: 12/28/2021 4:28:06 PM By: Massie Kluver Signed: 12/28/2021 5:04:29 PM By: Worthy Keeler PA-C Entered By: Massie Kluver on 12/28/2021 08:31:16 [name] ([id number]) -------------------------------------------------------------------------------- SuperBill Details Patient Name: [name] Date of Service: 12/28/2021 Medical Record Number: [id number] Patient Account Number: [id number] Date of Birth/Sex: 27-Apr-1981 (40 y.o. 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

## 2021-12-28 NOTE — Progress Notes (Signed)
[name], [name] ([id number]) Visit Report for 12/28/2021 Arrival Information Details Patient Name: [name], [name] Date of Service: 12/28/2021 8:15 AM Medical Record Number: [id number] Patient Account Number: [id number] Date of Birth/Sex: 02-24-1981 (41 y.o. M) Treating RN: [name] Primary Care Kayline Sheer: [name] Other Clinician: Massie Kluver Referring Sharnell [name]: [name] Treating Chrisy Hillebrand/Extender: [name] Cliche in Treatment: 66 Visit Information History Since Last Visit All ordered tests and consults were completed: No Patient Arrived: Ambulatory Added or deleted any medications: No Arrival Time: 08:29 Any new allergies or adverse reactions: No Transfer Assistance: None Had a fall or experienced change in No Patient Requires Transmission-Based No activities of daily living that may affect Precautions: risk of falls: Patient Has Alerts: Yes Hospitalized since last visit: No Patient Alerts: Patient has reaction Pain Present Now: No to silver dressings. Electronic Signature(s) Signed: 12/28/2021 4:28:06 PM By: Massie Kluver Entered By: Massie Kluver on 12/28/2021 08:30:06 [name] ([id number]) -------------------------------------------------------------------------------- Clinic Level of Care Assessment Details Patient Name: [name], [name] Date of Service: 12/28/2021 8:15 AM Medical Record Number: [id number] Patient Account Number: [id number] Date of Birth/Sex: 1980/11/15 (41 y.o. M) Treating RN: [name] Primary Care Julienne Vogler: [name] Other Clinician: Massie Kluver Referring Jaena Brocato: [name] Treating Delsin Copen/Extender: [name] Cliche in Treatment: 264 Clinic Level of Care Assessment Items TOOL 1 Quantity Score []  - Use when EandM and Procedure is performed on INITIAL visit 0 ASSESSMENTS - Nursing Assessment / Reassessment []  - General Physical Exam (combine w/ comprehensive assessment (listed just below) when performed on  new 0 pt. evals) []  - 0 Comprehensive Assessment (HX, ROS, Risk Assessments, Wounds Hx, etc.) ASSESSMENTS - Wound and Skin Assessment / Reassessment []  - Dermatologic / Skin Assessment (not related to wound area) 0 ASSESSMENTS - Ostomy and/or Continence Assessment and Care []  - Incontinence Assessment and Management 0 []  - 0 Ostomy Care Assessment and Management (repouching, etc.) PROCESS - Coordination of Care []  - Simple Patient / Family Education for ongoing care 0 []  - 0 Complex (extensive) Patient / Family Education for ongoing care []  - 0 Staff obtains Programmer, systems, Records, Test Results / Process Orders []  - 0 Staff telephones HHA, Nursing Homes / Clarify orders / etc []  - 0 Routine Transfer to another Facility (non-emergent condition) []  - 0 Routine Hospital Admission (non-emergent condition) []  - 0 New Admissions / Biomedical engineer / Ordering NPWT, Apligraf, etc. []  - 0 Emergency Hospital Admission (emergent condition) PROCESS - Special Needs []  - Pediatric / Minor Patient Management 0 []  - 0 Isolation Patient Management []  - 0 Hearing / Language / Visual special needs []  - 0 Assessment of Community assistance (transportation, D/C planning, etc.) []  - 0 Additional assistance / Altered mentation []  - 0 Support Surface(s) Assessment (bed, cushion, seat, etc.) INTERVENTIONS - Miscellaneous []  - External ear exam 0 []  - 0 Patient Transfer (multiple staff / Civil Service fast streamer / Similar devices) []  - 0 Simple Staple / Suture removal (25 or less) []  - 0 Complex Staple / Suture removal (26 or more) []  - 0 Hypo/Hyperglycemic Management (do not check if billed separately) []  - 0 Ankle / Brachial Index (ABI) - do not check if billed separately Has the patient been seen at the hospital within the last three years: Yes Total Score: 0 Level Of Care: ____ [name] ([id number]) Electronic Signature(s) Signed: 12/28/2021 4:28:06 PM By: Massie Kluver Entered By:  Massie Kluver on 12/28/2021 08:31:51 Mennenga, [name] ([id number]) -------------------------------------------------------------------------------- Compression Therapy Details Patient Name: [name] Date of Service: 12/28/2021 8:15 AM Medical  Record Number: [id number] Patient Account Number: [id number] Date of Birth/Sex: 11-Dec-1980 (40 y.o. M) Treating RN: [name] Primary Care Omare Bilotta: [name] Other Clinician: Massie Kluver Referring Jonmarc Bodkin: [name] Treating Amyr Sluder/Extender: Jeri Cos Weeks in Treatment: 264 Compression Therapy Performed for Wound Assessment: Wound #1 Left,Lateral Lower Leg Performed By: Lenice Pressman, Angie, Compression Type: Four Layer Pre Treatment ABI: 1.2 Electronic Signature(s) Signed: 12/28/2021 4:28:06 PM By: Massie Kluver Entered By: Massie Kluver on 12/28/2021 08:30:54 [name] ([id number]) -------------------------------------------------------------------------------- Encounter Discharge Information Details Patient Name: [name] Date of Service: 12/28/2021 8:15 AM Medical Record Number: [id number] Patient Account Number: [id number] Date of Birth/Sex: 26-Mar-1981 (40 y.o. M) Treating RN: [name] Primary Care Brecklyn Galvis: [name] Other Clinician: Massie Kluver Referring Shannell Mikkelsen: [name] Treating Le Faulcon/Extender: [name] Cliche in Treatment: 264 Encounter Discharge Information Items Discharge Condition: Stable Ambulatory Status: Ambulatory Discharge Destination: Home Transportation: Private Auto Accompanied By: self Schedule Follow-up Appointment: Yes Clinical Summary of Care: Electronic Signature(s) Signed: 12/28/2021 4:28:06 PM By: Massie Kluver Entered By: Massie Kluver on 12/28/2021 08:31:45 [name] ([id number]) -------------------------------------------------------------------------------- Wound Assessment Details Patient Name: [name] Date of Service: 12/28/2021  8:15 AM Medical Record Number: [id number] Patient Account Number: [id number] Date of Birth/Sex: [birth year] (40 y.o. M) Treating RN: [name] Primary Care Sharanda Shinault: [name] Other Clinician: Massie Kluver Referring [name] Deloach: [name] Treating Aiven Kampe/Extender: [name] Cliche in Treatment: 264 Wound Status Wound Number: 1 Primary Etiology: Pyoderma Wound Location: Left, Lateral Lower Leg Wound Status: Open Wounding Event: Gradually Appeared Comorbid History: Sleep Apnea, Hypertension, Colitis Date Acquired: 11/18/2015 Weeks Of Treatment: 264 Clustered Wound: No Wound Measurements Length: (cm) 2.4 Width: (cm) 1.1 Depth: (cm) 0.1 Area: (cm) 2.073 Volume: (cm) 0.207 % Reduction in Area: 57.8% % Reduction in Volume: 94.7% Epithelialization: Medium (34-66%) Wound Description Classification: Full Thickness With Exposed Support Structures Exudate Amount: Medium Exudate Type: Serosanguineous Exudate Color: red, brown Foul Odor After Cleansing: No Slough/Fibrino No Wound Bed Granulation Amount: Large (67-100%) Exposed Structure Granulation Quality: Red Fascia Exposed: No Necrotic Amount: Small (1-33%) Fat Layer (Subcutaneous Tissue) Exposed: No Necrotic Quality: Adherent Slough Tendon Exposed: No Muscle Exposed: No Joint Exposed: No Bone Exposed: No Treatment Notes Wound #1 (Lower Leg) Wound Laterality: Left, Lateral Cleanser Wound Cleanser Discharge Instruction: Wash your hands with soap and water. Remove old dressing, discard into plastic bag and place into trash. Cleanse the wound with Wound Cleanser prior to applying a clean dressing using gauze sponges, not tissues or cotton balls. Do not scrub or use excessive force. Pat dry using gauze sponges, not tissue or cotton balls. Peri-Wound Care Desitin Maximum Strength Ointment 4 (oz) Discharge Instruction: Apply around wound edges to avoid maceration Triamcinolone Acetonide Cream, 0.1%, 15 (g)  tube Discharge Instruction: Apply to leg Topical Clobetasol Propionate ointment 0.05%, 60 (g) tube Discharge Instruction: Pt to bring to appt- applied to wound bed Primary Dressing Apligraf Secondary Dressing (NON-BORDER) Zetuvit Plus Silicone NON-BORDER 5x5 (in/in) Icenogle, Shaunn ([id number]) Secured With Compression Wrap Medichoice 4 layer Compression System, 35-40 mmHG Discharge Instruction: Apply multi-layer wrap as directed. Compression Stockings Add-Ons Electronic Signature(s) Signed: 12/28/2021 9:12:30 AM By: [name], [name], RN, [name], [name], [name] Signed: 12/28/2021 4:28:06 PM By: Massie Kluver Entered By: Massie Kluver on 12/28/2021 08:30:29

## 2021-12-31 DIAGNOSIS — E11622 Type 2 diabetes mellitus with other skin ulcer: Secondary | ICD-10-CM | POA: Diagnosis not present

## 2022-01-02 DIAGNOSIS — E11622 Type 2 diabetes mellitus with other skin ulcer: Secondary | ICD-10-CM | POA: Diagnosis not present

## 2022-01-02 NOTE — Progress Notes (Signed)
LENNEX, PIETILA ([id number]) Visit Report for 12/31/2021 Arrival Information Details Patient Name: [name] Date of Service: 12/31/2021 8:00 AM Medical Record Number: [id number] Patient Account Number: [id number] Date of Birth/Sex: [birth year] (41 y.o. M) Treating RN: [name] Primary Care Dian Minahan: [name] Other Clinician: Referring Mayes Sangiovanni: [name] Treating Shaquavia Whisonant/Extender: [name] Cliche in Treatment: 97 Visit Information History Since Last Visit Added or deleted any medications: No Patient Arrived: Ambulatory Has Compression in Place as Prescribed: Yes Arrival Time: 08:13 Pain Present Now: No Transfer Assistance: None Patient Requires Transmission-Based No Precautions: Patient Has Alerts: Yes Patient Alerts: Patient has reaction to silver dressings. Electronic Signature(s) Signed: 12/31/2021 5:27:45 PM By: [name], [name], RN, [name], [name], [name] Entered By: [name], [name], RN, [name], [name] on 12/31/2021 08:18:02 [name] ([id number]) -------------------------------------------------------------------------------- Compression Therapy Details Patient Name: [name] Date of Service: 12/31/2021 8:00 AM Medical Record Number: [id number] Patient Account Number: [id number] Date of Birth/Sex: [birth year] (41 y.o. M) Treating RN: [name] Primary Care Liberty Seto: [name] Other Clinician: Referring Maeve Debord: [name] Treating Amee Boothe/Extender: [name] Cliche in Treatment: 264 Compression Therapy Performed for Wound Assessment: Wound #1 Left,Lateral Lower Leg Performed By: Clinician [name], RN Compression Type: Four Layer Pre Treatment ABI: 1.2 Electronic Signature(s) Signed: 12/31/2021 5:27:45 PM By: [name], [name], RN, [name], [name], [name] Entered By: [name], [name], RN, [name], [name] on 12/31/2021 08:20:01 [name] ([id number]) -------------------------------------------------------------------------------- Encounter Discharge Information  Details Patient Name: [name] Date of Service: 12/31/2021 8:00 AM Medical Record Number: [id number] Patient Account Number: [id number] Date of Birth/Sex: December 01, 1980 (41 y.o. M) Treating RN: [name] Primary Care Nezzie Manera: [name] Other Clinician: Referring Helder Crisafulli: [name] Treating Tameika Heckmann/Extender: [name] Cliche in Treatment: 264 Encounter Discharge Information Items Discharge Condition: Stable Ambulatory Status: Ambulatory Discharge Destination: Home Transportation: Private Auto Accompanied By: self Schedule Follow-up Appointment: Yes Clinical Summary of Care: Electronic Signature(s) Signed: 12/31/2021 5:27:45 PM By: [name], [name], RN, [name], [name], [name] Entered By: [name], [name], RN, [name], [name] on 12/31/2021 08:38:05 [name] ([id number]) -------------------------------------------------------------------------------- Wound Assessment Details Patient Name: [name] Date of Service: 12/31/2021 8:00 AM Medical Record Number: [id number] Patient Account Number: [id number] Date of Birth/Sex: 04-30-1981 (41 y.o. M) Treating RN: [name] Primary Care Carvin Almas: [name] Other Clinician: Referring Ebenezer Mccaskey: [name] Treating Saim Almanza/Extender: [name] Cliche in Treatment: 264 Wound Status Wound Number: 1 Primary Etiology: Pyoderma Wound Location: Left, Lateral Lower Leg Wound Status: Open Wounding Event: Gradually Appeared Comorbid History: Sleep Apnea, Hypertension, Colitis Date Acquired: 11/18/2015 Weeks Of Treatment: 264 Clustered Wound: No Photos Wound Measurements Length: (cm) 2.4 Width: (cm) 1.1 Depth: (cm) 0.1 Area: (cm) 2.073 Volume: (cm) 0.207 % Reduction in Area: 57.8% % Reduction in Volume: 94.7% Epithelialization: Medium (34-66%) Wound Description Classification: Full Thickness With Exposed Support Structure Exudate Amount: Medium Exudate Type: Serosanguineous Exudate Color: red, brown s Foul Odor After  Cleansing: No Slough/Fibrino No Wound Bed Granulation Amount: Large (67-100%) Exposed Structure Granulation Quality: Red Fascia Exposed: No Necrotic Amount: Small (1-33%) Fat Layer (Subcutaneous Tissue) Exposed: No Necrotic Quality: Adherent Slough Tendon Exposed: No Muscle Exposed: No Joint Exposed: No Bone Exposed: No Assessment Notes Apligraf remains in place today. Unable to measure. Electronic Signature(s) Signed: 12/31/2021 5:27:45 PM By: [name], [name], RN, [name], [name], [name] Entered By: [name], [name], RN, [name], [name] on 12/31/2021 [phone]

## 2022-01-04 ENCOUNTER — Encounter: Payer: 59 | Admitting: Physician Assistant

## 2022-01-04 DIAGNOSIS — E11622 Type 2 diabetes mellitus with other skin ulcer: Secondary | ICD-10-CM | POA: Diagnosis not present

## 2022-01-04 NOTE — Progress Notes (Signed)
MARS, SCHEAFFER ([id number]) Visit Report for 01/02/2022 Arrival Information Details Patient Name: [name], [name] Date of Service: 01/02/2022 8:00 AM Medical Record Number: [id number] Patient Account Number: [id number] Date of Birth/Sex: 10-29-1980 (41 y.o. M) Treating RN: [name] Primary Care Ebrima Ranta: [name] Other Clinician: Referring Brenda Samano: [name] Treating Paddy Walthall/Extender: Yaakov Guthrie in Treatment: 35 Visit Information History Since Last Visit Added or deleted any medications: No Patient Arrived: Ambulatory Pain Present Now: No Arrival Time: 08:03 Transfer Assistance: None Patient Identification Verified: Yes Secondary Verification Process Completed: Yes Patient Requires Transmission-Based No Precautions: Patient Has Alerts: Yes Patient Alerts: Patient has reaction to silver dressings. Electronic Signature(s) Signed: 01/03/2022 7:54:04 AM By: [name], [name], RN, [name], [name], [name] Entered By: [name], [name], RN, [name], [name] on 01/02/2022 08:25:29 [name], [name] ([id number]) -------------------------------------------------------------------------------- Compression Therapy Details Patient Name: [name] Date of Service: 01/02/2022 8:00 AM Medical Record Number: [id number] Patient Account Number: [id number] Date of Birth/Sex: 07/16/1980 (40 y.o. M) Treating RN: [name] Primary Care Shanyla Marconi: [name] Other Clinician: Referring Evert Wenrich: [name] Treating Jerome Otter/Extender: Yaakov Guthrie in Treatment: 265 Compression Therapy Performed for Wound Assessment: Wound #1 Left,Lateral Lower Leg Performed By: Clinician [name], RN Compression Type: Four Layer Pre Treatment ABI: 1.2 Electronic Signature(s) Signed: 01/03/2022 7:54:04 AM By: [name], [name], RN, [name], [name], [name] Entered By: [name], [name], RN, [name], [name] on 01/02/2022 08:27:30 [name]  ([id number]) -------------------------------------------------------------------------------- Encounter Discharge Information Details Patient Name: [name] Date of Service: 01/02/2022 8:00 AM Medical Record Number: [id number] Patient Account Number: [id number] Date of Birth/Sex: 1981/04/01 (40 y.o. M) Treating RN: [name] Primary Care [name] Souter: [name] Other Clinician: Referring Yanitza Shvartsman: [name] Treating Lorik Guo/Extender: Yaakov Guthrie in Treatment: 265 Encounter Discharge Information Items Discharge Condition: Stable Ambulatory Status: Ambulatory Discharge Destination: Home Transportation: Other Accompanied By: self Schedule Follow-up Appointment: Yes Clinical Summary of Care: Electronic Signature(s) Signed: 01/03/2022 7:54:04 AM By: [name], [name], RN, [name], [name], [name] Entered By: [name], [name], RN, [name], [name] on 01/02/2022 08:29:07 [name] ([id number]) -------------------------------------------------------------------------------- Wound Assessment Details Patient Name: [name] Date of Service: 01/02/2022 8:00 AM Medical Record Number: [id number] Patient Account Number: [id number] Date of Birth/Sex: 1980-08-26 (40 y.o. M) Treating RN: [name] Primary Care Tanyla Stege: [name] Other Clinician: Referring Unique Sillas: [name] Treating Coleston Dirosa/Extender: Yaakov Guthrie in Treatment: 265 Wound Status Wound Number: 1 Primary Etiology: Pyoderma Wound Location: Left, Lateral Lower Leg Wound Status: Open Wounding Event: Gradually Appeared Comorbid History: Sleep Apnea, Hypertension, Colitis Date Acquired: 11/18/2015 Weeks Of Treatment: 265 Clustered Wound: No Wound Measurements Length: (cm) 2.4 Width: (cm) 1.1 Depth: (cm) 0.1 Area: (cm) 2.073 Volume: (cm) 0.207 % Reduction in Area: 57.8% % Reduction in Volume: 94.7% Epithelialization: Medium (34-66%) Wound Description Classification: Full Thickness With Exposed  Support Structures Exudate Amount: Medium Exudate Type: Serosanguineous Exudate Color: red, brown Foul Odor After Cleansing: No Slough/Fibrino No Wound Bed Granulation Amount: Large (67-100%) Exposed Structure Granulation Quality: Red Fascia Exposed: No Necrotic Amount: Small (1-33%) Fat Layer (Subcutaneous Tissue) Exposed: Yes Necrotic Quality: Adherent Slough Tendon Exposed: No Muscle Exposed: No Joint Exposed: No Bone Exposed: No Assessment Notes Apligaft on patient. Used last visit measurements, did not remove apligraf. Electronic Signature(s) Signed: 01/03/2022 7:54:04 AM By: [name], [name], RN, [name], [name], [name] Entered By: [name], [name], RN, [name], [name] on 01/02/2022 08:27:02

## 2022-01-05 NOTE — Progress Notes (Addendum)
JULIUS, MATUS ([id number]) Visit Report for 01/04/2022 Chief Complaint Document Details Patient Name: [name], [name] Date of Service: 01/04/2022 8:15 AM Medical Record Number: [id number] Patient Account Number: [id number] Date of Birth/Sex: 1981-03-22 (41 y.o. 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 M) Treating RN: [name] Primary Care Provider: [name] Other Clinician: Massie Kluver Referring Provider: [name] Treating Provider/Extender: [name] Cliche in Treatment: 265 Cellular or Tissue Based Product Type Wound #1 Left,Lateral Lower Leg Applied to: Performed By: Physician Tommie Sams., PA-C Cellular or Tissue Based Product Apligraf Type: Level of Consciousness (Pre- Awake and Alert procedure): Pre-procedure Verification/Time Out Yes - 09:00 Taken: Location: trunk / arms / legs Wound Size (sq cm): 2.09 Product Size (sq cm): 44 Waste Size (sq cm): 33 Waste Reason: WOUND SMALLER Amount of Product Applied (sq cm): 11 Instrument Used: Blade Lot #: [id number] Order #: 3 Expiration Date: 01/12/2022 Fenestrated: Yes Instrument: Blade Reconstituted: Yes Solution Type: normal saline Solution Amount: 30m Lot #: [id number] Expiration Date:  01/05/2024 Secured: Yes Secured With: Steri-Strips Dressing Applied: Yes Primary Dressing: BOLUS Procedural Pain: 0 Post Procedural Pain: 0 Response to Treatment: Procedure was tolerated well Level of Consciousness (Post- Awake and Alert procedure): Post Procedure Diagnosis Same as Pre-procedure Electronic Signature(s) Signed: 01/04/2022 1:18:57 PM By: VMassie KluverEntered By: VMassie Kluveron 01/04/2022 10:59:19 [name]([id number] -------------------------------------------------------------------------------- HPI Details Patient Name: [name] KernsDate of Service: 01/04/2022 8:15 AM Medical Record Number: [id number] Account Number: [id number] of Birth/Sex: [birth year](41 y.o. M) Treating RN: WCornell BarmanPrimary Care Provider: CAlma FriendlyOther Clinician: VMassie KluverReferring Provider: CAlma FriendlyTreating Provider/Extender: SSkipper Clichein Treatment: 265 History of Present Illness HPI Description: 12/04/16; 41year old man who comes into the clinic today for review of a wound on the posterior left calf. He tells me that is been there for about a year. He is not a diabetic he does smoke half a pack per day. He was seen in the ER on 11/20/16 felt to have cellulitis around the wound and was given clindamycin. An x-ray did not show osteomyelitis. The patient initially tells me that he has a milk allergy that sets off a pruritic itching rash on his lower legs which she scratches incessantly and he thinks that's what may have set up the wound. He has been using various topical antibiotics and ointments without any effect. He works in a [employer] and is on his feet all day. He does not have a prior history of wounds however he does have the rash on both lower legs the right arm and the ventral aspect of his left arm. These are excoriations and clearly have had scratching however there are of macular looking areas on both legs including a substantial  larger area on the right leg. This does not have an underlying open area. There is no blistering. The [place]  saw a dermatologist who told him he had a condition which may be pyoderma gangrenosum although I may be putting words into his mouth. He seemed to recognize this. On further questioning he admits to a 5 year history of quiesced. ulcerative colitis. He is not in any treatment for this. He's had no recent travel 12/11/16; the patient arrives today with his wound and roughly the same condition we've been using silver alginate this is a deep punched out wound with some surrounding erythema but no tenderness. Biopsy I did did not show confirmed pyoderma gangrenosum suggested nonspecific inflammation and vasculitis but does not provide an actual description of what was seen by the pathologist. I'm really not able to understand this We have also received information from the patient's dermatologist in [place] notes from April 2016. This was a doctor Agarwal-antal. 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 He was prescribed topical steroid high potency under occlusion which helped but at this point the patient did not have a deep punched out wound. 12/18/16; the patient's wound is larger in terms of surface area however this surface looks better and there is less depth. The surrounding erythema also is better. The patient states that the wrap we put on came off 2 days ago when he has been using his compression stockings. He we are in the process of getting a dermatology consult. 12/26/16 on evaluation today patient's left lower extremity wound shows evidence of infection with surrounding erythema noted. He has been tolerating the dressing changes but states that he has noted more discomfort. There is a larger area of erythema surrounding the wound. No fevers, chills, nausea, or vomiting noted at this time. With that  being said the wound still does have slough covering the surface. He is not allergic to any medication that he is aware of at this point. In regard to his right lower extremity he had several regions that are erythematous and pruritic he wonders if there's anything we can do to help that. 01/02/17 I reviewed patient's wound culture which was obtained his visit last week. He was placed on doxycycline at that point. Unfortunately that does not appear to be an antibiotic that would likely help with the situation however the pseudomonas noted on culture is sensitive to Cipro. Also unfortunately patient's wound seems to have a large compared to last week's evaluation. Not severely so but there are definitely increased measurements in general. He is continuing to have discomfort as well he writes this to be a seven out of 10. In fact he would prefer me not to perform any debridement today due to the fact that he is having discomfort and considering he has an active infection on the little reluctant to do so anyway. No fevers, chills, nausea, or vomiting noted at this time. 01/08/17; patient seems dermatology on September 5. I suspect dermatology will want the slides from the biopsy I did sent to their pathologist. I'm not sure if there is a way we can expedite that. In any case the culture I did before I left on vacation 3 weeks ago showed Pseudomonas he was given 10 days of Cipro and per her description of her intake nurses is actually somewhat better this week although the wound is quite a bit bigger than I remember the last time I saw this. He still has 3 more days of Cipro 01/21/17; dermatology appointment tomorrow. He has completed the ciprofloxacin for Pseudomonas. Surface of the wound looks better however he is had  some deterioration in the lesions on his right leg. Meantime the left lateral leg wound we will continue with sample 01/29/17; patient had his dermatology appointment but I can't yet see that  note. He is completed his antibiotics. The wound is more superficial but considerably larger in circumferential area than when he came in. This is in his left lateral calf. He also has swollen erythematous areas with superficial wounds on the right leg and small papular areas on both arms. There apparently areas in her his upper thighs and buttocks I did not look at those. Dermatology biopsied the right leg. Hopefully will have their input next week. 02/05/17; patient went back to see his dermatologist who told him that he had a "scratching problem" as well as staph. He is now on a 30 day course of doxycycline and I believe she gave him triamcinolone cream to the right leg areas to help with the itching [not exactly sure but probably triamcinolone]. She apparently looked at the left lateral leg wound although this was not rebiopsied and I think felt to be ultimately part of the same pathogenesis. He is using sample border foam and changing nevus himself. He now has a new open area on the right posterior leg which was his biopsy site I don't have any of the dermatology notes 02/12/17; we put the patient in compression last week with SANTYL to the wound on the left leg and the biopsy. Edema is much better and the depth of the wound is now at level of skin. Area is still the same oBiopsy site on the right lateral leg we've also been using santyl with a border foam dressing and he is changing this himself. 02/19/17; Using silver alginate started last week to both the substantial left leg wound and the biopsy site on the right wound. He is tolerating compression well. Has a an appointment with his primary M.D. tomorrow wondering about diuretics although I'm wondering if the edema problem is actually lymphedema 02/26/17; the patient has been to see his primary doctor Dr. Jerrel Ivory at [place] our primary care. She started him on Lasix 20 mg and this seems to have helped with the edema. However we are not  making substantial change with the left lateral calf wound and inflammation. The biopsy site on the right leg also looks stable but not really all that different. 03/12/17; the patient has been to see vein and vascular Dr. Lucky Cowboy. He has had venous reflux studies I have not reviewed these. I did get a call from his dermatology office. They felt that he might have pathergy based on their biopsy on his right leg which led them to look at the slides of LAIN, TETTERTON ([id number]) the biopsy I did on the left leg and they wonder whether this represents pyoderma gangrenosum which was the original supposition in a man with ulcerative colitis albeit inactive for many years. They therefore recommended clobetasol and tetracycline i.e. aggressive treatment for possible pyoderma gangrenosum. 03/26/17; apparently the patient just had reflux studies not an appointment with Dr. dew. She arrives in clinic today having applied clobetasol for 2-3 weeks. He notes over the last 2-3 days excessive drainage having to change the dressing 3-4 times a day and also expanding erythema. He states the expanding erythema seems to come and go and was last this red was earlier in the month.he is on doxycycline 150 mg twice a day as an anti-inflammatory systemic therapy for possible pyoderma gangrenosum along with the topical clobetasol 04/02/17;  the patient was seen last week by Dr. Lillia Carmel at [facility] dermatology locally who kindly saw him at my request. 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 My concern was the degree of erythema medially extending from his left leg wound which was either inflammation from pyoderma or cellulitis. I put him on Augmentin however culture of the wound showed Pseudomonas which is quinolone sensitive. I really don't believe he has cellulitis however in view of everything I will continue and give him a course of Cipro. He is also on  doxycycline as an immune modulator for the pyoderma. In addition to his original wound on the left lateral leg with surrounding erythema he has a wound on the right posterior calf which was an original biopsy site done by dermatology. This was felt to represent pathergy from pyoderma gangrenosum 04/16/17; pyoderma gangrenosum. Saw Dr. Lillia Carmel yesterday. He has been using topical antibiotics to both wound areas his original wound on the left and the biopsies/pathergy area on the right. There is definitely some improvement in the inflammation around the wound on the right although the patient states he has increasing sensitivity of the wounds. He is on prednisone 60 and doxycycline 1 as prescribed by Dr. Lillia Carmel. He is covering the topical antibiotic with gauze and putting this in his own compression stocks and changing this daily. He states that Dr. Lottie Rater did a culture of the left leg wound yesterday 05/07/17; pyoderma gangrenosum. The patient saw Dr. Lillia Carmel yesterday and has a follow-up with her in one month. He is still using topical antibiotics to both wounds although he can't recall exactly what type. He is still on prednisone 60 mg. Dr. Lillia Carmel stated that the doxycycline could stop if we were in agreement. 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 The right medial leg was induced by biopsy/pathergy. The area on the right is essentially healed. Still on high-dose prednisone using topical antibiotics to the wound 07/09/17; pyoderma gangrenosum with wounds on the left lateral leg. The right medial leg has closed and remains closed. He is still on prednisone 60. oHe tells me he missed his last dermatology appointment with Dr. Lillia Carmel but will make another appointment. He reports that her blood sugar at a recent screen in [place] was high 200's. He was 180 today. He is more  cushingoid blood pressure is up a bit. I think he is going to require still much longer prednisone perhaps another 3 months before attempting to taper. In the meantime his wound is a lot better. Smaller. He is cleaning this off daily and applying topical antibiotics. When he was last in the clinic I thought about changing to [facility] and actually put in a couple of calls to dermatology although probably not during their business hours. In any case the wound looks better smaller I don't think there is any need to change what he is doing 08/06/17-he is here in follow up evaluation for pyoderma left leg ulcer. He continues on oral prednisone. He has been using triple antibiotic ointment. There is surface debris and we will transition to [facility] and have him return in 2 weeks. He has lost 30 pounds since his last appointment with lifestyle modification. He may benefit from topical steroid cream for treatment this can be considered at a later date. 08/22/17 on evaluation today patient appears to actually be doing rather well  in regard to his left lateral lower extremity ulcer. He has actually been managed by Dr. Dellia Nims most recently. Patient is currently on oral steroids at this time. This seems to have been of benefit for him. Nonetheless his last visit was actually with Leah on 08/06/17. Currently he is not utilizing any topical steroid creams although this could be of benefit as well. No fevers, chills, nausea, or vomiting noted at this time. 09/05/17 on evaluation today patient appears to be doing better in regard to his left lateral lower extremity ulcer. He has been tolerating the dressing changes without complication. He is using Santyl with good effect. Overall I'm very pleased with how things are standing at this point. Patient likewise is happy that this is doing better. 09/19/17 on evaluation today patient actually appears to be doing rather well in regard to his left lateral lower extremity ulcer. Again  this is secondary to Pyoderma gangrenosum and he seems to be progressing well with the Santyl which is good news. He's not having any significant pain. 10/03/17 on evaluation today patient appears to be doing excellent in regard to his lower extremity wound on the left secondary to Pyoderma gangrenosum. He has been tolerating the Santyl without complication and in general I feel like he's making good progress. 10/17/17 on evaluation today patient appears to be doing very well in regard to his left lateral lower surety ulcer. He has been tolerating the dressing changes without complication. There does not appear to be any evidence of infection he's alternating the Santyl and the triple antibiotic ointment every other day this seems to be doing well for him. 11/03/17 on evaluation today patient appears to be doing very well in regard to his left lateral lower extremity ulcer. He is been tolerating the dressing changes without complication which is good news. Fortunately there does not appear to be any evidence of infection which is also great news. Overall is doing excellent they are starting to taper down on the prednisone is down to 40 mg at this point it also started topical clobetasol for him. 11/17/17 on evaluation today patient appears to be doing well in regard to his left lateral lower surety ulcer. He's been tolerating the dressing changes without complication. He does note that he is having no pain, no excessive drainage or discharge, and overall he feels like things are going about how he would expect and hope they would. Overall he seems to have no evidence of infection at this time in my opinion which is good news. 12/04/17-He is seen in follow-up evaluation for right lateral lower extremity ulcer. He has been applying topical steroid cream. Today's measurement show slight increase in size. Over the next 2 weeks we will transition to every other day Santyl and steroid cream. He has  been encouraged to monitor for changes and notify clinic with any concerns 12/15/17 on evaluation today patient's left lateral motion the ulcer and fortunately is doing worse again at this point. This just since last week to this week has close to doubled in size according to the patient. I did not seeing last week's I do not have a visual to compare this to in our system was also down so we do not have all the charts and at this point. Nonetheless it does have me somewhat concerned in regard to the fact that again he was worried enough about it he has contact the dermatology that placed them back on the full strength, 50 mg a day of  the prednisone that he was taken previous. He continues to alternate using clobetasol along with Santyl at this point. He is obviously somewhat frustrated. 12/22/17 on evaluation today patient appears to be doing a little worse compared to last evaluation. Unfortunately the wound is a little deeper and slightly larger than the last week's evaluation. With that being said he has made some progress in regard to the irritation surrounding at this time unfortunately despite that progress that's been made he still has a significant issue going on here. 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 TYRELLE, RACZKA ([id number]) For that reason I discussed with him today the possibility of perform a wound culture to ensure there's nothing overtly infected. 01/06/18 on evaluation today patient's wound is larger and deeper than previously evaluated. With that being said it did appear that his wound was infected after my last evaluation with him. Subsequently I did end up prescribing a prescription for Bactrim DS which she has been taking and having no complication with. Fortunately there does not appear to be any evidence of infection at this  point in time as far as anything spreading, no want to touch, and overall I feel like things are showing signs of improvement. 01/13/18 on evaluation today patient appears to be even a little larger and deeper than last time. There still muscle exposed in the base of the wound. Nonetheless he does appear to be less erythematous I do believe inflammation is calming down also believe the infection looks like it's probably resolved at this time based on what I'm seeing. No fevers, chills, nausea, or vomiting noted at this time. 01/30/18 on evaluation today patient actually appears to visually look better for the most part. Unfortunately those visually this looks better he does seem to potentially have what may be an abscess in the muscle that has been noted in the central portion of the wound. This is the first time that I have noted what appears to be fluctuance in the central portion of the muscle. With that being said I'm somewhat more concerned about the fact that this might indicate an abscess formation at this location. I do believe that an ultrasound would be appropriate. This is likely something we need to try to do as soon as possible. He has been switch to mupirocin ointment and he is no longer using the steroid ointment as prescribed by dermatology he sees them again next week he's been decreased from 60 to 40 mg of prednisone. 03/09/18 on evaluation today patient actually appears to be doing a little better compared to last time I saw him. There's not as much erythema surrounding the wound itself. He I did review his most recent infectious disease note which was dated 02/24/18. He saw Dr. Michel Bickers in [place]. 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 Patient is now off of antibiotics and they are continually observing this. There seems to be no change in the past two weeks in my pinion based on what  the patient says and what I see today compared to what Dr. Megan Salon likely saw two weeks ago. No fevers, chills, nausea, or vomiting noted at this time. 03/23/18 on evaluation today patient's wound actually appears to be showing signs of improvement which is good news. He  is currently still on the Dapsone. He is also working on tapering the prednisone to get off of this and Dr. Lottie Rater is working with him in this regard. Nonetheless overall I feel like the wound is doing well it does appear based on the infectious disease note that I reviewed from Dr. 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 I did also review the results of his ultrasound of left lower extremity which revealed there was a dentist tissue in the base of the wound without an abscess noted. 04/06/18 on evaluation today the patient's left lateral lower extremity ulcer actually appears to be doing fairly well which is excellent news. There does not appear to be any evidence of infection at this time which is also great news. Overall he still does have a significantly large ulceration although little by little he seems to be making progress. He is down to 10 mg a day of the prednisone. 04/20/18 on evaluation today patient actually appears to be doing excellent at this time in regard to his left lower extremity ulcer. He's making signs of good progress unfortunately this is taking much longer than we would really like to see but nonetheless he is making progress. Fortunately there does not appear to be any evidence of infection at this time. No fevers, chills, nausea, or vomiting noted at this time. The patient has not been using the Santyl due to the cost he hadn't got in this field yet. He's mainly been using the antibiotic ointment topically. Subsequently he also tells me that he really has not been scrubbing in the shower I  think this would be helpful again as I told him it doesn't have to be anything too aggressive to even make it believe just enough to keep it free of some of the loose slough and biofilm on the wound surface. 05/11/18 on evaluation today patient's wound appears to be making slow but sure progress in regard to the left lateral lower extremity ulcer. He is been tolerating the dressing changes without complication. Fortunately there does not appear to be any evidence of infection at this time. He is still just using triple antibiotic ointment along with clobetasol occasionally over the area. He never got the Santyl and really does not seem to intend to in my pinion. 06/01/18 on evaluation today patient appears to be doing a little better in regard to his left lateral lower extremity ulcer. He states that overall he does not feel like he is doing as well with the Dapsone as he did with the prednisone. Nonetheless he sees his dermatologist later today and is gonna talk to them about the possibility of going back on the prednisone. Overall again I believe that the wound would be better if you would utilize Santyl but he really does not seem to be interested in going back to the [place] at this point. He has been using triple antibiotic ointment. 06/15/18 on evaluation today patient's wound actually appears to be doing about the same at this point. Fortunately there is no signs of infection at this time. He has made slight improvements although he continues to not really want to clean the wound bed at this point. He states that he just doesn't mess with it he doesn't want to cause any problems with everything else he has going on. He has been on medication, antibiotics as prescribed by his  dermatologist, for a staff infection of his lower extremities which is really drying out now and looking much better he tells me. Fortunately there is no sign of overall infection. 06/29/18 on evaluation today patient appears  to be doing well in regard to his left lateral lower surety ulcer all things considering. Fortunately his staff infection seems to be greatly improved compared to previous. He has no signs of infection and this is drying up quite nicely. He is still the doxycycline for this is no longer on cental, Dapsone, or any of the other medications. His dermatologist has recommended possibility of an infusion but right now he does not want to proceed with that. 07/13/18 on evaluation today patient appears to be doing about the same in regard to his left lateral lower surety ulcer. Fortunately there's no signs of infection at this time which is great news. Unfortunately he still builds up a significant amount of Slough/biofilm of the surface of the wound he still is not really cleaning this as he should be appropriately. Again I'm able to easily with saline and gauze remove the majority of this on the surface which if you would do this at home would likely be a dramatic improvement for him as far as getting the area to improve. Nonetheless overall I still feel like he is making progress is just very slow. I think Santyl will be of benefit for him as well. Still he has not gotten this as of this point. 07/27/18 on evaluation today patient actually appears to be doing little worse in regards of the erythema around the periwound region of the wound he also tells me that he's been having more drainage currently compared to what he was experiencing last time I saw him. He states not quite as bad as what he had because this was infected previously but nonetheless is still appears to be doing poorly. Fortunately there is no evidence of systemic infection at this point. The patient tells me that he is not going to be able to afford the Santyl. He is still waiting to hear about the infusion therapy with his dermatologist. Apparently she wants an updated colonoscopy first. 08/10/18 on evaluation today patient appears to be  doing better in regard to his left lateral lower extremity ulcer. Fortunately he is showing signs of improvement in this regard he's actually been approved for Remicade infusion's as well although this has not been scheduled as of yet. Fortunately there's no signs of active infection at this time in regard to the wound although he is having some issues with infection of the right lower extremity is been seen as dermatologist for this. Fortunately they are definitely still working with him trying to keep things under control. [name], [name] ([id number]) 09/07/18 on evaluation today patient is actually doing rather well in regard to his left lateral lower extremity ulcer. He notes these actually having some hair grow back on his extremity which is something he has not seen in years. He also tells me that the pain is really not giving them any trouble at this time which is also good news overall she is very pleased with the progress he's using a combination of the mupirocin along with the probate is all mixed. 09/21/18 on evaluation today patient actually appears to be doing fairly well all things considered in regard to his looks from the ulcer. He's been tolerating the dressing changes without complication. Fortunately there's no signs of active infection at this time which is good news  he is still on all antibiotics or prevention of the staff infection. He has been on prednisone for time although he states it is gonna contact his dermatologist and see if she put them on a short course due to some irritation that he has going on currently. Fortunately there's no evidence of any overall worsening this is going very slow I think cental would be something that would be helpful for him although he states that $50 for tube is quite expensive. He therefore is not willing to get that at this point. 10/06/18 on evaluation today patient actually appears to be doing decently well in regard to his left lateral leg  ulcer. He's been tolerating the dressing changes without complication. Fortunately there's no signs of active infection at this time. Overall I'm actually rather pleased with the progress he's making although it's slow he doesn't show any signs of infection and he does seem to be making some improvement. I do believe that he may need a switch up and dressings to try to help this to heal more appropriately and quickly. 10/19/18 on evaluation today patient actually appears to be doing better in regard to his left lateral lower extremity ulcer. This is shown signs of having much less Slough buildup at this point due to the fact he has been using the [employer]. Obviously this is very good news. The overall size of the wound is not dramatically smaller but again the appearance is. 11/02/18 on evaluation today patient actually appears to be doing quite well in regard to his lower [place] ulcer. A lot of the skin around the ulcer is actually somewhat irritating at this point this seems to be more due to the dressing causing irritation from the adhesive that anything else. Fortunately there is no signs of active infection at this time. 11/24/18 on evaluation today patient appears to be doing a little worse in regard to his overall appearance of his lower extremity ulcer. There's more erythema and warmth around the wound unfortunately. He is currently on doxycycline which he has been on for some time. With that being said I'm not sure that seems to be helping with what appears to possibly be an acute cellulitis with regard to his left lower extremity ulcer. No fevers, chills, nausea, or vomiting noted at this time. 12/08/18 on evaluation today patient's wounds actually appears to be doing significantly better compared to his last evaluation. He has been using Santyl along with alternating tripling about appointment as well as the steroid cream seems to be doing quite well and the wound is showing signs of improvement  which is excellent news. Fortunately there's no evidence of infection and in fact his culture came back negative with only normal skin flora noted. 12/21/2018 upon evaluation today patient actually appears to be doing excellent with regard to his ulcer. This is actually the best that I have seen it since have been helping to take care of him. It is both smaller as well as less slough noted on the surface of the wound and seems to be showing signs of good improvement with new skin growing from the edges. He has been using just the triamcinolone he does wonder if he can get a refill of that ointment today. 01/04/2019 upon evaluation today patient actually appears to be doing well with regard to his left lateral lower extremity ulcer. With that being said it does not appear to be that he is doing quite as well as last time as far as progression  is concerned. There does not appear to be any signs of infection or significant irritation which is good news. With that being said I do believe that he may benefit from switching to a collagen based dressing based on how clean The wound appears. 01/18/2019 on evaluation today patient actually appears to be doing well with regard to his wound on the left lower extremity. He is not made a lot of progress compared to where we were previous but nonetheless does seem to be doing okay at this time which is good news. There is no signs of active infection which is also good news. My only concern currently is I do wish we can get him into utilizing the collagen dressing his insurance would not pay for the supplies that we ordered although it appears that he may be able to order this through his supply company that he typically utilizes. This is [employer]. Nonetheless he did try to order it during the office visit today and it appears this did go through. We will see if he can get that it is a different brand but nonetheless he has collagen and I do think will be  beneficial. 02/01/2019 on evaluation today patient actually appears to be doing a little worse today in regard to the overall size of his wounds. Fortunately there is no signs of active infection at this time. That is visually. Nonetheless when this is happened before it was due to infection. For that reason were somewhat concerned about that this time as well. 02/08/2019 on evaluation today patient unfortunately appears to be doing slightly worse with regard to his wound upon evaluation today. Is measuring a little deeper and a little larger unfortunately. I am not really sure exactly what is causing this to enlarge he actually did see his dermatologist she is going to see about initiating Humira for him. Subsequently she also did do steroid injections into the wound itself in the periphery. Nonetheless still nonetheless he seems to be getting a little bit larger he is gone back to just using the steroid cream topically which I think is appropriate. I would say hold off on the collagen for the time being is definitely a good thing to do. Based on the culture results which we finally did get the final result back regarding it shows staph as the bacteria noted again that can be a normal skin bacteria based on the fact however he is having increased drainage and worsening of the wound measurement wise I would go ahead and place him on an antibiotic today I do believe for this. 02/15/2019 on evaluation today patient actually appears to be doing somewhat better in regard to his ulcer. There is no signs of worsening at this time I did review his culture results which showed evidence of Staphylococcus aureus but not MRSA. Again this could just be more related to the normal skin bacteria although he states the drainage has slowed down quite a bit he may have had a mild infection not just colonization. And was much smaller and then since around10/04/2019 on evaluation today patient appears to be doing  unfortunately worse as far as the size of the wound. I really feel like that this is steadily getting larger again it had been doing excellent right at the beginning of September we have seen a steady increase in the area of the wound it is almost 2-1/2 times the size it was on September 1. Obviously this is a bad trend this is not wanting  to see. For that reason we went back to using just the topical triamcinolone cream which does seem to help with inflammation. I checked him for bacteria by way of culture and nothing showed positive there. I am considering giving him a short course of a tapering steroid Dosepak today to see if that is can be beneficial for him. The patient is in agreement with giving that a try. 03/08/2019 on evaluation today patient appears to be doing very well in comparison to last evaluation with regard to his lower extremity ulcer. This is showing signs of less inflammation and actually measuring slightly smaller compared to last time every other week over the past month and a half he has been measuring larger larger larger. Nonetheless I do believe that the issue has been inflammation the prednisone does seem to [facility], Jalan ([id number]) have been beneficial for him which is good news. No fevers, chills, nausea, vomiting, or diarrhea. 03/22/2019 on evaluation today patient appears to be doing about the same with regard to his leg ulcer. He has been tolerating the dressing changes without complication. With that being said the wound seems to be mostly arrested at its current size but really is not making any progress except for when we prescribed the prednisone. He did show some signs of dropping as far as the overall size of the wound during that interval week. Nonetheless this is something he is not on long-term at this point and unfortunately I think he is getting need either this or else the Humira which his dermatologist has discussed try to get approval for. With that  being said he will be seeing his dermatologist on the 11th of this month that is November. 04/19/2019 on evaluation today patient appears to be doing really about the same the wound is measuring slightly larger compared to last time I saw him. He has not been into the office since November 2 due to the fact that he unfortunately had Covid as that his entire family. He tells me that it was rough but they did pull-through and he seems to be doing much better. Fortunately there is no signs of active infection at this time. No fevers, chills, nausea, vomiting, or diarrhea. 05/10/2019 on evaluation today patient unfortunately appears to be doing significantly worse as compared to last time I saw him. He does tell me that he has had his first dose of Humira and actually is scheduled to get the next one in the upcoming week. With that being said he tells me also that in the past several days he has been having a lot of issues with green drainage she showed me a picture this is more blue-green in color. He is also been having issues with increased sloughy buildup and the wound does appear to be larger today. Obviously this is not the direction that we want everything to take based on the starting of his Humira. Nonetheless I think this is definitely a result of likely infection and to be honest I think this is probably Pseudomonas causing the infection based on what I am seeing. 05/24/2019 on evaluation today patient unfortunately appears to be doing significantly worse compared to his prior evaluation with me 2 weeks ago. I did review his culture results which showed that he does have Staph aureus as well as Pseudomonas noted on the culture. Nonetheless the Levaquin that I prescribed for him does not appear to have been appropriate and in fact he tells me he is no longer experiencing the  green drainage and discharge that he had at the last visit. Fortunately there is no signs of active infection at this time  which is good news although the wound has significantly worsened it in fact is much deeper than it was previous. We have been utilizing up to this point triamcinolone ointment as the prescription topical of choice but at this time I really feel like that the wound is getting need to be packed in order to appropriately manage this due to the deeper nature of the wound. Therefore something along the lines of an alginate dressing may be more appropriate. 05/31/2019 upon inspection today patient's wound actually showed signs of doing poorly at this point. Unfortunately he just does not seem to be making any good progress despite what we have tried. He actually did go ahead and pick up the Cipro and start taking that as he was noticing more green drainage he had previously completed the Levaquin that I prescribed for him as well. Nonetheless he missed his appointment for the seventh last week on Wednesday with the wound care center and [facility] where his dermatologist referred him. Obviously I do think a second opinion would be helpful at this point especially in light of the fact that the patient seems to be doing so poorly despite the fact that we have tried everything that I really know how at this point. The only thing that ever seems to have helped him in the past is when he was on high doses of continual steroids that did seem to make a difference for him. 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 06/07/2019 upon evaluation today patient unfortunately appears to be doing worse yet again with regard to his wound. In fact I am starting to question whether or not he may have a fluid pocket in the muscle at this point based on the bulging and the soft appearance to the central portion of the muscle area. There is not anything draining from the  muscle itself at this time which is good news but nonetheless the wound is expanding. I am not really seeing any results of the Humira as far as overall wound progression based on what I am seeing at this point. The patient has been referred for second opinion with regard to his wound to the [facility] wound care center by his dermatologist which I definitely am not in opposition to. Unfortunately we tried multiple dressings in the past including collagen, alginate, and at one point even Hydrofera Blue. With that being said he is never really used it for any significant amount of time due to the fact that he often complains of pain associated with these dressings and then will go back to either using the Santyl which she has done intermittently or more frequently the triamcinolone. He is also using his own compression stockings. We have wrapped him in the past but again that was something else that he really was not a big fan of. 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 He seemed to do much better when he was on the steroids although honestly I understand that the steroids are not necessarily the best medication to be on long-term obviously 06/14/2019 on evaluation today patient appears to be doing actually a little bit better with regard to the overall appearance with his leg. Unfortunately he does continue to have issues with what appears to be some fluid underneath the muscle although he did see the wound specialty center at [facility] last week their main goals were to see about infusion therapy in place of the Humira as they feel like that is not quite strong enough. They also recommended  that we continue with the treatment otherwise as we are they felt like that was appropriate and they are okay with him continuing to follow-up here with [place] in that regard. With that being said they are also sending him to the vein specialist there to see about vein stripping and if that would be of benefit for him. Subsequently they also did not really address whether or not an ultrasound of the muscle area to see if there is anything that needs to be addressed here would be appropriate or not. For that reason I discussed this with him last week I think we may proceed down that road at this point. 06/21/2019 upon evaluation today patient's wound actually appears to be doing slightly better compared to previous evaluations. I do believe that he has made a difference with regard to the progression here with the use of oral steroids. Again in the past has been the only thing that is really calm things down. He does tell me that from [facility] is gotten a good news from there that there are no further vein stripping that is necessary at this point. I do not have that available for review today although the patient did relay this to me. He also did obtain and have the ultrasound of the wound completed which I did sign off on today. It does appear that there is no fluid collection under the muscle this is likely then just edematous tissue in general. That is also good news. Overall I still believe the inflammation is the main issue here. He did inquire about the possibility of a wound VAC again with the muscle protruding like it is I am not really sure whether the wound VAC is necessarily ideal or not. That is something we will have to consider although I do believe he may need compression wrapping to try to help with edema control which could potentially be of benefit. 06/28/2019 on evaluation today patient appears to be doing slightly better measurement wise although this is not terribly smaller he least seems  to be trending towards that direction. With that being said he still seems to have purulent drainage noted in the wound bed at this time. He has been on Levaquin followed by Cipro over the past month. Unfortunately he still seems to have some issues with active infection at this time. I did perform a culture last week in order to evaluate and see if indeed there was still anything going on. Subsequently the culture did come back showing Pseudomonas which is consistent with the drainage has been having which is blue-green in color. He also has had an odor that again was somewhat consistent with Pseudomonas as well. Long story short it appears that the culture showed an intermediate finding with regard to how well the Cipro  will work for the Pseudomonas infection. Subsequently being that he does not seem to be clearing up and at best what we are doing is just keeping this at [place] I think he may need to see infectious disease to discuss IV antibiotic options. [name], [name] ([id number]) 07/05/2019 upon evaluation today patient appears to be doing okay in regard to his leg ulcer. He has been tolerating the dressing changes at this point without complication. Fortunately there is no signs of active infection at this time which is good news. No fevers, chills, nausea, vomiting, or diarrhea. With that being said he does have an appointment with infectious disease tomorrow and his primary care on Wednesday. Again the reason for the infectious disease referral was due to the fact that he did not seem to be fully resolving with the use of oral antibiotics and therefore we were thinking that IV antibiotic therapy may be necessary secondary to the fact that there was an intermediate finding for how effective the Cipro may be. Nonetheless again he has been having a lot of purulent and even green drainage. Fortunately right now that seems to have calmed down over the past week with the reinitiation of the oral  antibiotic. Nonetheless we will see what Dr. Megan Salon has to say. 07/12/2019 upon evaluation today patient appears to be doing about the same at this point in regard to his left lower extremity ulcer. Fortunately there is no signs of active infection at this time which is good news I do believe the Levaquin has been beneficial I did review Dr. Hale Bogus note and to be honest I agree that the patient's leg does appear to be doing better currently. What we found in the past as he does not seem to really completely resolve he will stop the antibiotic and then subsequently things will revert back to having issues with blue-green drainage, increased pain, and overall worsening in general. Obviously that is the reason I sent him back to infectious disease. 07/19/2019 upon evaluation today patient appears to be doing roughly the same in size there is really no dramatic improvement. He has started back on the Levaquin at this point and though he seems to be doing okay he did still have a lot of blue/green drainage noted on evaluation today unfortunately. I think that this is still indicative more likely of a Pseudomonas infection as previously noted and again he does see Dr. Megan Salon in just a couple of days. I do not know that were really able to effectively clear this with just oral antibiotics alone based on what I am seeing currently. Nonetheless we are still continue to try to manage as best we can with regard to the patient and his wound. I do think the wrap was helpful in decreasing the edema which is excellent news. No fevers, chills, nausea, vomiting, or diarrhea. 07/26/2019 upon evaluation today patient appears to be doing slightly better with regard to the overall appearance of the muscle there is no dark discoloration centrally. Fortunately there is no signs of active infection at this time. No fevers, chills, nausea, vomiting, or diarrhea. Patient's wound bed currently the patient did have an  appointment with Dr. Megan Salon at [facility] last week. With that being said Dr. Megan Salon the patient states was still somewhat hesitant about put him on any IV antibiotics he wanted [place] to repeat cultures today and then see where things go going forward. He does look like Dr. Megan Salon because of some improvement the patient did have with  the Levaquin wanted [place] to see about repeating cultures. If it indeed grows the Pseudomonas again then he recommended a possibility of considering a PICC line placement and IV antibiotic therapy. He plans to see the patient back in 1 to 2 weeks. 08/02/2019 upon evaluation today patient appears to be doing poorly with regard to his left lower extremity. We did get the results of his culture back it shows that he is still showing evidence of Pseudomonas which is consistent with the purulent/blue-green drainage that he has currently. Subsequently the culture also shows that he now is showing resistance to the oral fluoroquinolones which is unfortunate as that was really the only thing to treat the infection prior. I do believe that he is looking like this is going require IV antibiotic therapy to get this under control. Fortunately there is no signs of systemic infection at this time which is good news. The patient does see Dr. Megan Salon tomorrow. 08/09/2019 upon evaluation today patient appears to be doing better with regard to his left lower extremity ulcer in regard to the overall appearance. He is currently on IV antibiotic therapy. As ordered by Dr. Megan Salon. Currently the patient is on ceftazidime which she is going to take for the next 2 weeks and then follow-up for 4 to 5-week appointment with Dr. Megan Salon. The patient started this this past Friday symptoms have not for a total of 3 days currently in full. 08/16/2019 upon evaluation today patient's wound actually does show muscle in the base of the wound but in general does appear to be much better as far as  the overall evidence of infection is concerned. In fact I feel like this is for the most part cleared up he still on the IV antibiotics he has not completed the full course yet but I think he is doing much better which is excellent news. 08/23/2019 upon evaluation today patient appears to be doing about the same with regard to his wound at this point. He tells me that he still has pain unfortunately. Fortunately there is no evidence of systemic infection at this time which is great news. There is significant muscle protrusion. 09/13/19 upon evaluation today patient appears to be doing about the same in regard to his leg unfortunately. He still has a lot of drainage coming from the ulceration there is still muscle exposed. With that being said the patient's last wound culture still showed an intermediate finding with regard to the Pseudomonas he still having the bluish/green drainage as well. Overall I do not know that the wound has completely cleared of infection at this point. Fortunately there is no signs of active infection systemically at this point which is good news. 09/20/2019 upon evaluation today patient's wound actually appears to be doing about the same based on what I am seeing currently. I do not see any signs of systemic infection he still does have evidence of some local infection and drainage. He did see Dr. Megan Salon last week and Dr. Megan Salon states that he probably does need a different IV antibiotic although he does not want to put him on this until the patient begins the Remicade infusion which is actually scheduled for about 10 days out from today on 13 May. Following that time Dr. Megan Salon is good to see him back and then will evaluate the feasibility of starting him on the IV antibiotic therapy once again at that point. I do not disagree with this plan I do believe as Dr. Megan Salon stated in his  note that I reviewed today that the patient's issue is multifactorial with the pyoderma  being 1 aspect of this that were hoping the Remicade will be helpful for her. In the meantime I think the gentamicin is, helping to keep things under decent okay control in regard to the ulcer. 09/27/2019 upon evaluation today patient appears to be doing about the same with regard to his wound still there is a lot of muscle exposure though he does have some hyper granulation tissue noted around the edge and actually some granulation tissue starting to form over the muscle which is actually good news. Fortunately there is no evidence of active infection which is also good news. His pain is less at this point. 5/21; this is a patient I have not seen in a long time. 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 He has a large wound on the left lateral leg with protruding muscle. He comes in the clinic today showing the same area on his left medial ankle. He says there is been a spot there for some time although we have not previously defined this. Today he has a clearly defined area with slight amount of skin breakdown surrounded by raised areas with a purplish hue in color. This is not painful he says it is irritated. 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 He still has muscle protruding from the lateral portion of his left leg but fortunately the new area that they were concerned about at his last visit does not appear to have opened at this point. He is currently on Remicade infusions and seems to be doing better in my opinion in fact the wound itself seems to be overall much better. The purplish discoloration that he did have seems to have resolved and I think that is a good sign that hopefully the Remicade is doing its job. He does have some biofilm noted over the surface of the wound. 11/01/2019 on evaluation today patient's wound actually appears to be  doing excellent at this time. Fortunately there is no evidence of active infection and overall I feel like he is making great progress. The Remicade seems to be due excellent job in my opinion. [name], [name] ([id number]) 11/08/19 evaluation today vision actually appears to be doing quite well with regard to his weight ulcer. He's been tolerating dressing changes without complication. Fortunately there is no evidence of infection. No fevers, chills, nausea, or vomiting noted at this time. Overall states that is having more itching than pain which is actually a good sign in my opinion. 12/13/2019 upon evaluation today patient appears to be doing well today with regard to his wound. He has been tolerating the dressing changes without complication. Fortunately there is no sign of active infection at this time. No fevers, chills, nausea, vomiting, or diarrhea. Overall I feel like the infusion therapy has been very beneficial for him. 01/06/2020 on evaluation today patient appears to be doing well with regard to his wound. This is measuring smaller and actually looks to be doing better. Fortunately there is no signs of active infection at this point. No fevers, chills, nausea, vomiting, or diarrhea. With that being said he does still have the blue-green drainage but this does not seem to be causing any significant issues currently. He has been using the gentamicin that does seem to be keeping things under decent control at this point. He goes  later this morning for his next infusion therapy for the pyoderma which seems to also be very beneficial. 02/07/2020 on evaluation today patient appears to be doing about the same in regard to his wounds currently. Fortunately there is no signs of active infection systemically he does still have evidence of local infection still using gentamicin. He also is showing some signs of improvement albeit slowly I do feel like we are making some progress here. 02/21/2020 upon  evaluation today patient appears to be making some signs of improvement the wound is measuring a little bit smaller which is great news and overall I am very pleased with where he stands currently. He is going to be having infusion therapy treatment on the 15th of this month. Fortunately there is no signs of active infection at this time. 03/13/2020 I do believe patient's wound is actually showing some signs of improvement here which is great news. He has continue with the infusion therapy through rheumatology/dermatology at [facility]. That does seem to be beneficial. I still think he gets as much benefit from this as he did from the prednisone initially but nonetheless obviously this is less harsh on his body that the prednisone as far as they are concerned. 03/31/2020 on evaluation today patient's wound actually showing signs of some pretty good improvement in regard to the overall appearance of the wound bed. There is still muscle exposed though he does have some epithelial growth around the edges of the wound. Fortunately there is no signs of active infection at this time. No fevers, chills, nausea, vomiting, or diarrhea. 04/24/2020 upon evaluation today patient appears to be doing about the same in regard to his leg ulcer. He has been tolerating the dressing changes without complication. Fortunately there is no signs of active infection at this time. No fevers, chills, nausea, vomiting, or diarrhea. With that being said he still has a lot of irritation from the bandaging around the edges of the wound. We did discuss today the possibility of a referral to plastic surgery. 05/22/2020 on evaluation today patient appears to be doing well with regard to his wounds all things considered. He has not been able to get the Chantix apparently there is a recall nurse that I was unaware of put out by [employer] involuntarily. Nonetheless for now I am and I have to do some research into what may be the best option for him  to help with quitting in regard to smoking and we discussed that today. 06/26/2020 upon evaluation today patient appears to be doing well with regard to his wound from the standpoint of infection I do not see any signs of infection at this point. With that being said unfortunately he is still continuing to have issues with muscle exposure and again he is not having a whole lot of new skin growth unfortunately. There does not appear to be any signs of active infection at this time. No fevers, chills, nausea, vomiting, or diarrhea. 07/10/2020 upon evaluation today patient appears to be doing a little bit more poorly currently compared to where he was previous. I am concerned currently about an active infection that may be getting worse especially in light of the increased size and tenderness of the wound bed. No fevers, chills, nausea, vomiting, or diarrhea. 07/24/2020 upon evaluation today patient appears to be doing poorly in regard to his leg ulcer. He has been tolerating the dressing changes without complication but unfortunately is having a lot of discomfort. 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 Subsequently I think he is going require possibly IV antibiotics to get this under control. I am very concerned about the severity of his infection and the amount of discomfort he is having. 07/31/2020 upon evaluation today patient appears to be doing about the same in regard to his leg wound. He did see Dr. Megan Salon and Dr. Megan Salon is actually going to start him on IV antibiotics. He goes for the PICC line tomorrow. With that being said there do not have that run for 2 weeks and then see how things are doing and depending on how he is progressing they may extend that a little longer. Nonetheless I am glad this is getting ready to be in place and definitely feel it may help the patient. In the meantime is been using mainly triamcinolone  to the wound bed has an anti-inflammatory. 08/07/2020 on evaluation today patient appears to be doing well with regard to his wound compared even last week. In the interim he has gotten the PICC line placed and overall this seems to be doing excellent. There does not appear to be any evidence of infection which is great news systemically although locally of course has had the infection this appears to be improving with the use of the antibiotics. 08/14/2020 upon evaluation today patient's wound actually showing signs of excellent improvement. Overall the irritation has significantly improved the drainage is back down to more of a normal level and his pain is really pretty much nonexistent compared to what it was. Obviously I think that this is significantly improved secondary to the IV antibiotic therapy which has made all the difference in the world. Again he had a resistant form of Pseudomonas for which oral antibiotics just was not cutting it. Nonetheless I do think that still we need to consider the possibility of a surgical closure for this wound is been open so long and to be honest with muscle exposed I think this can be very hard to get this to close outside of this although definitely were still working to try to do what we can in that regard. 08/21/2020 upon evaluation today patient appears to be doing very well with regard to his wounds on the left lateral lower extremity/calf area. Fortunately there does not appear to be signs of active infection which is great news and overall very pleased with where things stand today. He is actually wrapping up his treatment with IV antibiotics tomorrow. After that we will see where things go from there. 08/28/2020 upon evaluation today patient appears to be doing decently well with regard to his leg ulcer. There does not appear to be any signs of active infection which is great news and overall very pleased with where things stand today. No fevers, chills,  nausea, vomiting, or diarrhea. 09/18/2020 upon evaluation today patient appears to be doing well with regard to his infection which I feel like is better. Unfortunately he is not doing as well with regard to the overall size of the wound which is not nearly as good at this point. I feel like that he may be having an issue here with the pyoderma being somewhat out of control. I think that he may benefit from potentially going back and talking to the dermatologist about HASHEEM, VOLAND ([id number]) what to do from the pyoderma standpoint. I am not certain if the infusions are helping nearly as much is what the prednisone did in the past. 10/02/2020 upon evaluation today patient  appears to be doing well with regard to his leg ulcer. He did go to the [facility] nurse. Unfortunately they feel like there is a 10% chance that most that he would be able to heal and that the skin graft would take. Obviously this has led him to not be able to go down that path as far as treatment is concerned. Nonetheless he does seem to be doing a little bit better with the prednisone that I gave him last time. I think that he may need to discuss with dermatology the possibility of long-term prednisone as that seems to be what is most helpful for him to be perfectly honest. I am not sure the Remicade is really doing the job. 10/17/2020 upon evaluation today patient appears to be doing a little better in regard to his wound. In fact the case has been since we did the prednisone on May 2 for him that we have noticed a little bit of improvement each time we have seen a size wise as well as appearance wise as well as pain wise. I think the prednisone has had a greater effect then the infusion therapy has to be perfectly honest. With that being said the patient also feels significantly better compared to what he was previous. All of this is good news but nonetheless I am still concerned about the fact that again we are really not set up to  long-term manage him as far as prednisone is concerned. Obviously there are things that you need to be watched I completely understand the risk of prednisone usage as well. 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 With all that being said I do believe that we can give him another round of the prednisone which she is requesting today because of the improvement that he seen since we did that first round. 10/30/2020 upon evaluation today patient's wound actually is showing signs of doing quite well. There does not appear to be any evidence of infection which is great news and overall very pleased with where things stand today. No fevers, chills, nausea, vomiting, or diarrhea. He tells me that the prednisone still has seem to have helped he wonders if we can extend that for just a little bit longer. He did not have the appointment with a dermatologist although he did have an infusion appointment last Friday. That was at [name]. With that being said he tells me he could not do both that as well as the appointment with the physician on the same day therefore that is can have to be rescheduled. I really want to see if there is anything they feel like that could be done differently to try to help this out as I am not really certain that the infusions are helping significantly here. 11/13/2020 upon evaluation today patient unfortunately appears to be doing somewhat poorly in regard to his wound I feel like this is actually worsening from the standpoint of the pyoderma spreading. I still feel like that he may need something different as far as trying to manage this going forward. Again we did the prednisone unfortunately his blood sugars are not doing so well because of this. Nonetheless I believe that the patient likely needs to try topical steroid. We have done triamcinolone for a while I think going with something stronger such as clobetasol could be beneficial  again this is not something I do lightly I discussed this with the patient that again  this does not normally put underneath an occlusive dressing. Nonetheless I think a thin film as such could help with some of the stronger anti-inflammatory effects. We discussed this today. He would like to try to give this a trial for the next couple weeks. I definitely think that is something that we can do. Evaluate7/03/2021 and today patient's wound bed actually showed signs of doing really about the same. There was a little expansion of the size of the wound and that leading edge that we done looking out although the clobetasol does seem to have slowed this down a bit in my opinion. There is just 1 small area that still seems to be progressing based on what I see. Nonetheless I am concerned about the fact this does not seem to be improving if anything seems to be doing a little bit worse. I do not know that the infusions are really helping him much as next infusion is August 5 his appointment with dermatology is July 25. Either way I really think that we need to have a conversation potentially about this and I am actually going to see if I can talk with Dr. Lillia Carmel in order to see where things stand as well. 12/11/2020 upon evaluation today patient appears to be doing worse in regard to his leg ulcer. Unfortunately I just do not think this is making the progress that I would like to see at this point. Honestly he does have an appointment with dermatology and this is in 2 days. I am wondering what they may have to offer to help with this. Right now what I am seeing is that he is continuing to show signs of worsening little by little. Obviously that is not great at all. Is the exact opposite of what we are looking for. 12/18/2020 upon evaluation today patient appears to be doing a little better in regard to his wound. The dermatologist actually did do some steroid injections into the wound which does seem to have  been beneficial in my opinion. That was on the 25th already this looks a little better to me than last time I saw him. With that being said we did do a culture and this did show that he has Staph aureus noted in abundance in the wound. With that being said I do think that getting him on an oral antibiotic would be appropriate as well. Also think we can compression wrap and this will make a difference as well. 12/28/2020 upon evaluation today patient's wound is actually showing signs of doing much better. I do believe the compression wrap is helping he has a lot of drainage but to be honest I think that the compression is helping to some degree in this regard as well as not draining through which is also good news. No fevers, chills, nausea, vomiting, or diarrhea. 01/04/2021 upon evaluation today patient appears to be doing well with regard to his wound. Overall things seem to be doing quite well. He did have a little bit of reaction to the CarboFlex Sorbact he will be using that any longer. With that being said he is controlled as far as the drainage is concerned overall and seems to be doing quite well. I do not see any signs of active infection at this time which is great news. No fevers, chills, nausea, vomiting, or diarrhea. 01/11/2021 upon evaluation today patient appears to be doing well with regard to his wounds. He has been tolerating the dressing changes without complication. Fortunately there does  not appear to be any signs of active infection at this time which is great news. Overall I am extremely pleased with where we stand currently. No fevers, chills, nausea, vomiting, or diarrhea. Where using clobetasol in the wound bed he has a lot of new skin growth which is awesome as well. 01/18/2021 upon evaluation today patient appears to be doing very well in regard to his leg ulcer. He has been tolerating the dressing changes without complication. Fortunately there does not appear to be any signs  of active infection which is great news. In general I think that he is making excellent progress 01/25/2021 upon evaluation today patient appears to be doing well with regard to his wound on the leg. I am actually extremely pleased with where things stand today. There does not appear to be any signs of active infection which is great news and overall I think that we are definitely headed in the appropriate direction based on what I am seeing currently. There does not appear to be any signs of active infection also excellent news. 02/06/2021 upon evaluation today patient appears to be doing well with regard to his wound. Overall visually this is showing signs of significant improvement which is great news. I do not see any signs of active infection systemically which is great even locally I do not think that we are seeing any major complications here. We did do fluorescence imaging with the [employer] today. The patient does have some odor and drainage noted and again this is something that I think would benefit him to probably come more frequently for nurse visits. 02/19/2021 upon evaluation today patient actually appears to be doing quite well in regard to his wound. He has been tolerating the dressing Malkiewicz, Raffaele ([id number]) changes without complication and overall I think that this is making excellent progress. I do not see any evidence of active infection at this point which is great news as well. No fevers, chills, nausea, vomiting, or diarrhea. 10/10; wound is made nice progress healthy granulation with a nice rim of epithelialization which seems to be expanding even from last week he has a deeper area in the inferior part of the more distal part of the wound with not quite as healthy as surface. This area will need to be followed. Using clobetasol and Hydrofera Blue 03/05/2021 upon evaluation today patient appears to be doing very well in regard to his leg ulcer. He has been tolerating  dressing changes without complication. Fortunately there does not appear to be any signs of active infection which is great news and overall I am extremely pleased with where we stand currently. 03/12/2021 upon evaluation today patient appears to be doing well with regard to his wound in fact this is extremely extremely good based on what we are seeing today there does not appear to be any signs of active infection and overall I think that he is doing awesome from the standpoint of healing in general. I am extremely pleased with how things seem to be progressing with regard to this pyoderma. Clobetasol has done wonders for him. I think the compression wrapping has also been of great benefit. 03/19/2021 upon evaluation today patient appears to be doing well with regard to his wound. He is tolerating the dressing changes without complication. In fact I feel like that he is actually making excellent progress at this point based on what I am seeing. No fevers, chills, nausea, vomiting, or diarrhea. 03/26/2021 upon evaluation today patient appears to be  doing well with regard to his wound. This again is measuring smaller and looking better. Again the progress is slow but nonetheless continual with what we have been seeing. I do believe that the current plan is doing awesome for him. 04/09/2021 upon evaluation today patient appears to be doing well with regard to his leg ulcer. This is showing signs of excellent improvement the muscle is completely closed over and there does not appear to be any evidence of inflammation at this point his drainage is significantly improved. Overall I think that he would be a good candidate for looking into a skin substitute at this point as well. We will get a look into some approvals in that regard. Potentially TheraSkin as well as Apligraf could both be considered just depending on insurance coverage. 04/16/2021 upon evaluation today patient appears to be doing well at  this point. He has been approved for the Apligraf which we could definitely order although I would like to try to get the TheraSkin approved if at all possible. I did fax notes into them today and I Georgina Peer try to give a call as well. Overall the wound appears to be doing decently well today. 04/20/2021 upon evaluation today patient actually appears to be doing quite well in regard to his wound with that being said we are trying to see what we can do about speeding up the healing process. For that reason we did discuss the possibility of a skin substitute. We got the Apligraf approved. For that reason we will get a go ahead and see what we can do with the Apligraf at this point. I am still trying to get the TheraSkin approved but I have not heard anything from the insurance company yet I have called and talked to them earlier in the week and to be honest this was about half an hour that I spent on the phone they told me I would hear something within 10-15 business days. 04/27/2021 upon evaluation today patient appears to be doing excellent in regard to his wound. I do believe the Apligraf has been beneficial. With that being said he has had a little bit of increased pain has been a little bit concerned. I do want to go ahead and apply his steroid cream today the clobetasol and then put the Apligraf over I just do not think I want to risk not putting the clobetasol on based on what is been going on here currently. Patient voiced understanding. 05/04/2021 upon evaluation today patient is making excellent improvement. Overall there is definitely decrease in the size of the wound today and I am very pleased in that regard. I do not see any signs of active infection locally nor systemically at this point. No fevers, chills, nausea, vomiting, or diarrhea. 05/11/2021 upon evaluation today patient appears to be doing well with regard to his wound. He has been tolerating Apligraf's without complication and is  making excellent progress this is application #4 today. 12/30; patient here for Apligraf application. Appears to be doing well small wound there has been a lot of healing 05/28/2021 upon evaluation today patient appears to be doing well with regard to his wound. Has been tolerating the dressing changes without complication. Fortunately I do not see any signs of active infection locally nor systemically at this point which is great news. He is done with the Apligraf and the first round and overall this has filled in quite nicely I am not even certain he needs any additional at the  moment. I would actually try to go back to clobetasol with [facility] which previously was doing well for him. 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 He is using clobetasol and Hydrofera Blue. Surface area of the wound is smaller and the surface looks very healthy. 06/11/2021 upon evaluation today patient actually appears to be showing signs of good improvement which is great news. Fortunately I do not see any signs of active infection at this time which is also excellent news. I do believe that the patient is doing well with the clobetasol and Hydrofera Blue. He does have some irritation and itching around the rest of the leg will use some triamcinolone over this area. 06/18/2021 upon evaluation today patient appears to be doing well with regard to his wound. He is showing signs of excellent improvement and overall I am extremely pleased with where we stand today. We are moving in the right direction. 06/25/2021 upon evaluation today patient appears to be doing well with regard to his wound. In fact this is doing also not showing signs of excellent improvement overall very pleased with where we stand today. I do not see any evidence of active infection locally or systemically at this time which is also great news. 07/02/2021 upon evaluation patient's wound bed actually  showed signs of good granulation and epithelization at this point. And this is measuring significantly smaller and overall I am extremely pleased with where we stand today. No fevers, chills, nausea, vomiting, or diarrhea. 07/09/2021 upon evaluation patient's wound bed actually showed signs of good granulation and epithelization at this point. Fortunately there does not appear to be any evidence of active infection locally or systemically which is great news and overall I am extremely pleased with where we stand at this point. 07/16/2021 upon evaluation today patient appears to be doing well with regard to his wound all things considered although it is maybe a little bit larger than what its been. This does have me a little concerned. Actually question about whether anything is changed and initially he told me ELIZANDRO, [name] ([id number]) know. In fact it was not until the end of the visit that he actually did mention that he had missed his infusion in January. This very well may be what is because the difference is also seeing currently. That definitely has seem to been helping more recently. 07/23/2021 upon evaluation today patient's wound is yet again larger despite the switch to a collagen which was not beneficial. Subsequently I am going to at this point check on a couple things. First and foremost I do want to go ahead and do a culture. I think that there is a chance he could have a low-lying infection that may be part of the issue here. Subsequently I am also probably can go ahead and place him on Bactrim DS which I think is a good option and has done well with them in the past. Again if this is an infection that should hopefully help to turn things around. With all that being said I do believe that the patient should hopefully be able to turn this around with reinitiation of the infusion therapy which will be going on this week and subsequently getting on the antibiotic. 3/13; patient presents for  follow-up. He has no issues or complaints today. He states he is currently on oral antibiotics for previous culture done in the office. 08/06/2021 upon evaluation today patient appears to be doing well with regard  to his wound. I am actually seeing signs of improvement which is great news. I think that he is on a better track he did receive his infusion as well which is great news and overall I think that this should hopefully get him back on track as far as healing is concerned. He is not having any pain and I do not see any signs of inflammation which is great news. 08/13/2021 upon evaluation today patient appears to be doing excellent in regard to his wound on the leg. He has been tolerating the dressing changes without complication. Fortunately I do not see any signs of infection I do believe he is making good progress here and I think her back on track overall the tissue is greatly improved compared to what we have been. 08/20/2021 upon evaluation today patient appears to be doing excellent in regard to his wound. He has been tolerating the dressing changes without complication. Fortunately I do not see any evidence of infection at this time locally or systemically which is great news and overall very pleased with where we stand. 08-31-2021 upon evaluation today patient appears to be doing excellent in regard to his wound. Overall even with the vacation and extra walking he seems to have really done well. Fortunately I do not see any evidence of active infection locally or systemically which is great news and overall I think we are headed in the right direction. 09-10-2021 upon evaluation today patient's wound is showing signs of improvement. This is a small improvement but nonetheless we seem to be making progress here. I am very pleased in that regard. There does not appear to be any signs of infection and overall things are doing quite nicely. Upon inspection patient's wound bed actually showed  signs of good granulation and epithelization at this point. There was some irritation around the edges of the wound where the good skin was it almost appears that something was rubbing or else is was due from drainage and irritation. Nonetheless I am going to see about putting a little bit of zinc just around the wound opening in order to protect the skin from being damaged. The patient is in agreement with that plan. 5/8; its been a while since I have seen this wound and it looks considerably better although still with some depth. Per our intake nurse the dimensions have improved patient has been using a thin layer of clobetasol, Hydrofera Blue under 4-layer compression. He appears to be making progress 10-01-2021 upon evaluation today patient appears to be doing well with regard to his wound although it is somewhat stalled I feel like were basically at a standstill here. He has previously had Apligraf which did very well for him to be honest. I do believe that he may benefit from a repeat of the Apligraf. In fact it got so small at that point but I do not think we even used all of the applications that were recommended as this had improved to such a degree. Nonetheless I do believe that the patient currently could benefit from repeat treatment with the Apligraf due to the improvement this that we did see. He is not opposed to this and subsequently working to look into getting approval to reinstitute that treatment. 10-08-2021 upon evaluation today patient appears to be doing well with regard to his wound. Overall I feel like he is doing a little bit better from the standpoint of irritation at this time which is great news. I do not see any  signs of active infection locally or systemically which is great news as well. No fevers, chills, nausea, vomiting, or diarrhea. 10-12-2021 upon evaluation today patient appears to be doing well with regard to his wound stable although we are can initiate treatment  with Apligraf today. I am hoping this will stimulate things to improve. Patient is in agreement with that plan. 10-19-2028 upon evaluation today patient appears to be doing well with regard to his wound. He has been tolerating the dressing changes without complication. Fortunately there does not appear to be any evidence of active infection locally or systemically at this time which is great news. No fevers, chills, nausea, vomiting, or diarrhea. I do believe the Apligraf has improved the overall appearance of the wound bed receiving some definite improvements here. 10-26-2021 upon evaluation today patient's wound is actually shown some signs of really good improvement I am very pleased in this regard. There is a little bit of slough and biofilm buildup that is stuck to the surface of the wound I am getting very lightly debrided in order to clear this away and then subsequently will get a continue with the Apligraf application today. This is #3. 11-09-2021 upon evaluation today patient appears to be doing well currently in regard to his wound. This is actually showing signs of significant improvement which is great news and overall I think the Apligraf is doing an amazing job. This wound is significantly smaller compared to the last time I saw him. This will be Apligraf application #4. [id number] upon evaluation today patient appears to be doing well currently in regard to his wound he is actually showing signs of excellent improvement which is great news and overall I am extremely pleased with where we stand today. I do not see any evidence of active infection locally or systemically at this time which is great news. No fevers, chills, nausea, vomiting, or diarrhea. I believe the Apligraf is doing an awesome job and in fact I [place] see about getting an extension of application which hopefully should not be a problem. Especially in light of how much improvement he has had with regards to the size of the  wound with treatment. This is application #5 today. 12-07-2021 upon evaluation today patient appears to be doing excellent in regard to his wound has been tolerating the dressing changes without complication. Fortunately there does not appear to be any signs of active infection locally or systemically at this time. 12-21-2021 upon evaluation today patient appears to be doing well currently in regard to his wound. We have gotten approval for a repeat Apligraf treatment. I think that is going to be a good way to wrap this up I really feel like we will likely be able to get the wound closed with the additional Randa, Jahsiah ([id number]) treatments that have been improved. 01-04-2022 upon evaluation patient appears to be doing excellent in regard to his wound. He is tolerating the dressing changes without complication and is measuring much better today as well. I am very pleased with where we stand. I am not even certain we will get have to use all 5 of the reapproved Apligraf but we will see how things go right now I am extremely pleased with the progress that is been made. Electronic Signature(s) Signed: 01/04/2022 10:09:31 AM By: Worthy Keeler PA-C Entered By: Worthy Keeler on 01/04/2022 10:09:31 [name], [name] ([id number]) -------------------------------------------------------------------------------- Physical Exam Details Patient Name: [name] Date of Service: 01/04/2022 8:15 AM Medical Record Number: [id number] Patient  Account Number: [id number] Date of Birth/Sex: Dec 02, 1980 (40 y.o. M) Treating RN: [name] Primary Care Provider: [name] Other Clinician: Massie Kluver Referring Provider: [name] Treating Provider/Extender: [name] Cliche in Treatment: 28 Constitutional Well-nourished and well-hydrated in no acute distress. Respiratory normal breathing without difficulty. Psychiatric this patient is able to make decisions and demonstrates good insight into  disease process. Alert and Oriented x 3. pleasant and cooperative. Notes Then applyPatient's wound bed did not require any sharp debridement at all today it seems to be doing quite well. Fortunately I was able to clean away some of the necrotic debris just with saline and gauze I then did proceed to reapply the Apligraf this includes the clobetasol foot down first zinc around the edges of the wound to help protect the good skin. Next I applied the Apligraf itself to the wound bed and following this after applying Skin-Prep to use the Mepitel to secure in place along with Steri-Strips. He tolerated the procedure today without complication. Electronic Signature(s) Signed: 01/04/2022 10:10:10 AM By: Worthy Keeler PA-C Entered By: Worthy Keeler on 01/04/2022 10:10:10 [name] ([id number]) -------------------------------------------------------------------------------- Physician Orders Details Patient Name: [name] Date of Service: 01/04/2022 8:15 AM Medical Record Number: [id number] Patient Account Number: [id number] Date of Birth/Sex: 07/02/1980 (40 y.o. M) Treating RN: [name] Primary Care Provider: [name] Other Clinician: Massie Kluver Referring Provider: [name] Treating Provider/Extender: [name] Cliche in Treatment: [phone] Verbal / Phone Orders: No Diagnosis Coding ICD-10 Coding Code Description I87.2 Venous insufficiency (chronic) (peripheral) L97.222 Non-pressure chronic ulcer of left calf with fat layer exposed E11.622 Type 2 diabetes mellitus with other skin ulcer L88 Pyoderma gangrenosum F17.208 Nicotine dependence, unspecified, with other nicotine-induced disorders Follow-up Appointments o Return Appointment in 1 week. o Nurse Visit as needed - twice a week Bathing/ Shower/ Hygiene o Clean wound with Normal Saline or wound cleanser. o May shower with wound dressing protected with water repellent cover or cast protector. o No tub  bath. Cellular or Tissue Based Products Wound #1 Left,Lateral Lower Leg o Cellular or Tissue Based Product applied to wound bed; including contact layer, fixation with steri-strips, dry gauze and cover dressing. (DO NOT REMOVE). - Apligraf #7 applied Edema Control - Lymphedema / Segmental Compressive Device / Other o Optional: One layer of unna paste to top of compression wrap (to act as an anchor). - He needs this to hold it up o Elevate, Exercise Daily and Avoid Standing for Long Periods of Time. o Elevate legs to the level of the heart and pump ankles as often as possible o Elevate leg(s) parallel to the floor when sitting. Wound Treatment Wound #1 - Lower Leg Wound Laterality: Left, Lateral Cleanser: Wound Cleanser 3 x Per Week/30 Days Discharge Instructions: Wash your hands with soap and water. Remove old dressing, discard into plastic bag and place into trash. Cleanse the wound with Wound Cleanser prior to applying a clean dressing using gauze sponges, not tissues or cotton balls. Do not scrub or use excessive force. Pat dry using gauze sponges, not tissue or cotton balls. Peri-Wound Care: Desitin Maximum Strength Ointment 4 (oz) 3 x Per Week/30 Days Discharge Instructions: Apply around wound edges to avoid maceration Peri-Wound Care: Triamcinolone Acetonide Cream, 0.1%, 15 (g) tube 3 x Per Week/30 Days Discharge Instructions: Apply to leg Topical: Clobetasol Propionate ointment 0.05%, 60 (g) tube 3 x Per Week/30 Days Discharge Instructions: Pt to bring to appt- applied to wound bed Primary Dressing: Apligraf 3 x Per  Week/30 Days Secondary Dressing: (NON-BORDER) Zetuvit Plus Silicone NON-BORDER 5x5 (in/in) 3 x Per Week/30 Days Compression Wrap: Medichoice 4 layer Compression System, 35-40 mmHG 3 x Per Week/30 Days Discharge Instructions: Apply multi-layer wrap as directed. [name], [name] ([id number]) Electronic Signature(s) Signed: 01/04/2022 12:34:53 PM By: Worthy Keeler  PA-C Entered By: Worthy Keeler on 01/04/2022 09:05:29 [name], [name] ([id number]) -------------------------------------------------------------------------------- Problem List Details Patient Name: [name] Date of Service: 01/04/2022 8:15 AM Medical Record Number: [id number] Patient Account Number: [id number] Date of Birth/Sex: 06-06-1980 (40 y.o. M) Treating RN: [name] Primary Care Provider: [name] Other Clinician: Massie Kluver Referring Provider: [name] Treating Provider/Extender: [name] Cliche in Treatment: 265 Active Problems ICD-10 Encounter Code Description Active Date MDM Diagnosis I87.2 Venous insufficiency (chronic) (peripheral) 12/04/2016 No Yes L97.222 Non-pressure chronic ulcer of left calf with fat layer exposed 12/04/2016 No Yes E11.622 Type 2 diabetes mellitus with other skin ulcer 04/09/2021 No Yes L88 Pyoderma gangrenosum 03/26/2017 No Yes F17.208 Nicotine dependence, unspecified, with other nicotine-induced disorders 04/24/2020 No Yes Inactive Problems ICD-10 Code Description Active Date Inactive Date L97.213 Non-pressure chronic ulcer of right calf with necrosis of muscle 04/02/2017 04/02/2017 Resolved Problems ICD-10 Code Description Active Date Resolved Date L97.321 Non-pressure chronic ulcer of left ankle limited to breakdown of skin 10/08/2019 10/08/2019 L03.116 Cellulitis of left lower limb 05/24/2019 05/24/2019 Electronic Signature(s) Signed: 01/04/2022 8:28:17 AM By: Worthy Keeler PA-C Entered By: Worthy Keeler on 01/04/2022 08:28:16 [name] ([id number]) -------------------------------------------------------------------------------- Progress Note Details Patient Name: [name] Date of Service: 01/04/2022 8:15 AM Medical Record Number: [id number] Patient Account Number: [id number] Date of Birth/Sex: Jun 02, 1980 (40 y.o. M) Treating RN: [name] Primary Care Provider: [name] Other Clinician: Massie Kluver Referring Provider: [name] Treating Provider/Extender: [name] Cliche in Treatment: 265 Subjective Chief Complaint Information obtained from Patient He is here in follow up evaluation for LLE pyoderma ulcer History of Present Illness (HPI) 12/04/16; 41 year old man who comes into the clinic today for review of a wound on the posterior left calf. He tells me that is been there for about a year. He is not a diabetic he does smoke half a pack per day. He was seen in the ER on 11/20/16 felt to have cellulitis around the wound and was given clindamycin. An x-ray did not show osteomyelitis. The patient initially tells me that he has a milk allergy that sets off a pruritic itching rash on his lower legs which she scratches incessantly and he thinks that's what may have set up the wound. He has been using various topical antibiotics and ointments without any effect. He works in a [employer]ing [employer] and is on his feet all day. He does not have a prior history of wounds however he does have the rash on both lower legs the right arm and the ventral aspect of his left arm. These are excoriations and clearly have had scratching however there are of macular looking areas on both legs including a substantial larger area on the right leg. This does not have an underlying open area. There is no blistering. The patient tells me that 2 years ago in [place] in response to the rash on his legs he saw a dermatologist who told him he had a condition which may be pyoderma gangrenosum although I may be putting words into his mouth. He seemed to recognize this. On further questioning he admits to a 5 year history of quiesced. ulcerative colitis. He is not in any treatment for this.  He's had no recent travel 12/11/16; the patient arrives today with his wound and roughly the same condition we've been using silver alginate this is a deep punched out wound with some surrounding erythema but no tenderness. Biopsy I did  did not show confirmed pyoderma gangrenosum suggested nonspecific inflammation and vasculitis but does not provide an actual description of what was seen by the pathologist. I'm really not able to understand this We have also received information from the patient's dermatologist in [place] notes from April 2016. This was a doctor Agarwal-antal. 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 He was prescribed topical steroid high potency under occlusion which helped but at this point the patient did not have a deep punched out wound. 12/18/16; the patient's wound is larger in terms of surface area however this surface looks better and there is less depth. The surrounding erythema also is better. The patient states that the wrap we put on came off 2 days ago when he has been using his compression stockings. He we are in the process of getting a dermatology consult. 12/26/16 on evaluation today patient's left lower extremity wound shows evidence of infection with surrounding erythema noted. He has been tolerating the dressing changes but states that he has noted more discomfort. There is a larger area of erythema surrounding the wound. No fevers, chills, nausea, or vomiting noted at this time. With that being said the wound still does have slough covering the surface. He is not allergic to any medication that he is aware of at this point. In regard to his right lower extremity he had several regions that are erythematous and pruritic he wonders if there's anything we can do to help that. 01/02/17 I reviewed patient's wound culture which was obtained his visit last week. He was placed on doxycycline at that point. Unfortunately that does not appear to be an antibiotic that would likely help with the situation however the pseudomonas noted on culture is sensitive to Cipro. Also unfortunately patient's wound seems to have a large compared to last week's evaluation. Not severely so but there are  definitely increased measurements in general. He is continuing to have discomfort as well he writes this to be a seven out of 10. In fact he would prefer me not to perform any debridement today due to the fact that he is having discomfort and considering he has an active infection on the little reluctant to do so anyway. No fevers, chills, nausea, or vomiting noted at this time. 01/08/17; patient seems dermatology on September 5. I suspect dermatology will want the slides from the biopsy I did sent to their pathologist. I'm not sure if there is a way we can expedite that. In any case the culture I did before I left on vacation 3 weeks ago showed Pseudomonas he was given 10 days of Cipro and per her description of her intake nurses is actually somewhat better this week although the wound is quite a bit bigger than I remember the last time I saw this. He still has 3 more days of Cipro 01/21/17; dermatology appointment tomorrow. He has completed the ciprofloxacin for Pseudomonas. Surface of the wound looks better however he is had some deterioration in the lesions on his right leg. Meantime the left lateral leg wound we will continue with sample 01/29/17; patient had his dermatology appointment but I can't yet see that note. He is completed his antibiotics. The wound is more superficial but considerably larger in circumferential area than  when he came in. This is in his left lateral calf. He also has swollen erythematous areas with superficial wounds on the right leg and small papular areas on both arms. There apparently areas in her his upper thighs and buttocks I did not look at those. Dermatology biopsied the right leg. Hopefully will have their input next week. 02/05/17; patient went back to see his dermatologist who told him that he had a "scratching problem" as well as staph. He is now on a 30 day course of doxycycline and I believe she gave him triamcinolone cream to the right leg areas to help with  the itching [not exactly sure but probably triamcinolone]. She apparently looked at the left lateral leg wound although this was not rebiopsied and I think felt to be ultimately part of the same pathogenesis. He is using sample border foam and changing nevus himself. He now has a new open area on the right posterior leg which was his biopsy site I don't have any of the dermatology notes 02/12/17; we put the patient in compression last week with SANTYL to the wound on the left leg and the biopsy. Edema is much better and the depth of the wound is now at level of skin. Area is still the same Biopsy site on the right lateral leg we've also been using santyl with a border foam dressing and he is changing this himself. 02/19/17; Using silver alginate started last week to both the substantial left leg wound and the biopsy site on the right wound. He is tolerating compression well. Has a an appointment with his primary M.D. tomorrow wondering about diuretics although I'm wondering if the edema problem is actually lymphedema MURIEL, [name] ([id number]) 02/26/17; the patient has been to see his primary doctor Dr. Jerrel Ivory at [facility] our primary care. She started him on Lasix 20 mg and this seems to have helped with the edema. However we are not making substantial change with the left lateral calf wound and inflammation. The biopsy site on the right leg also looks stable but not really all that different. 03/12/17; the patient has been to see vein and vascular Dr. Lucky Cowboy. He has had venous reflux studies I have not reviewed these. I did get a call from his dermatology office. 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 They therefore recommended  clobetasol and tetracycline i.e. aggressive treatment for possible pyoderma gangrenosum. 03/26/17; apparently the patient just had reflux studies not an appointment with Dr. dew. She arrives in clinic today having applied clobetasol for 2-3 weeks. He notes over the last 2-3 days excessive drainage having to change the dressing 3-4 times a day and also expanding erythema. 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 Lillia Carmel at [facility] dermatology locally who kindly saw him at my request. 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 My concern was the degree of erythema medially extending from his left leg wound  which was either inflammation from pyoderma or cellulitis. I put him on Augmentin however culture of the wound showed Pseudomonas which is quinolone sensitive. I really don't believe he has cellulitis however in view of everything I will continue and give him a course of Cipro. He is also on doxycycline as an immune modulator for the pyoderma. In addition to his original wound on the left lateral leg with surrounding erythema he has a wound on the right posterior calf which was an original biopsy site done by dermatology. 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 Saw Dr. Lillia Carmel yesterday. He has been using topical antibiotics to both wound areas his original wound on the left and the biopsies/pathergy area on the right. There is definitely some improvement in the inflammation around the wound on the right although the patient states he has increasing sensitivity of the wounds. He is on prednisone 60 and doxycycline 1 as prescribed  by Dr. Lillia Carmel. He is covering the topical antibiotic with gauze and putting this in his own compression stocks and changing this daily. He states that Dr. Lottie Rater did a culture of the left leg wound yesterday 05/07/17; pyoderma gangrenosum. The patient saw Dr. Lillia Carmel yesterday and has a follow-up with her in one month. He is still using topical antibiotics to both wounds although he can't recall exactly what type. He is still on prednisone 60 mg. Dr. Lillia Carmel stated that the doxycycline could stop if we were in agreement. 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 The right medial leg was induced by biopsy/pathergy. The area on the right is essentially healed. 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 The right medial leg has closed and remains closed. He is still on prednisone 60. He tells me he missed his last dermatology appointment with Dr. Lillia Carmel but will make another appointment. He reports that her blood sugar at a recent screen in [place] was high 200's. He was 180 today. 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 I think he is going to require still much longer prednisone perhaps another 3 months before attempting to taper. In the meantime his wound is a lot better. Smaller. He is cleaning this off daily and applying topical antibiotics. When he was last in the clinic I thought about changing to [facility] and actually put in a couple of calls to dermatology although probably not during their business hours. 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 He continues on oral prednisone. He has been using triple antibiotic ointment. There is surface  debris and we will transition to [facility] and have him return in 2 weeks. He has lost 30 pounds since his last appointment with lifestyle modification. He may benefit from topical steroid cream for treatment this can be considered at a later date. 08/22/17 on evaluation today patient appears to actually be doing rather well in regard to his left lateral lower extremity ulcer. He has actually been managed by Dr. Dellia Nims most recently. Patient is currently on oral steroids at this time. This seems to have been of benefit for him. Nonetheless his last visit was actually with Leah on 08/06/17. Currently he is  not utilizing any topical steroid creams although this could be of benefit as well. No fevers, chills, nausea, or vomiting noted at this time. 09/05/17 on evaluation today patient appears to be doing better in regard to his left lateral lower extremity ulcer. He has been tolerating the dressing changes without complication. He is using Santyl with good effect. Overall I'm very pleased with how things are standing at this point. Patient likewise is happy that this is doing better. 09/19/17 on evaluation today patient actually appears to be doing rather well in regard to his left lateral lower extremity ulcer. Again this is secondary to Pyoderma gangrenosum and he seems to be progressing well with the Santyl which is good news. He's not having any significant pain. 10/03/17 on evaluation today patient appears to be doing excellent in regard to his lower extremity wound on the left secondary to Pyoderma gangrenosum. He has been tolerating the Santyl without complication and in general I feel like he's making good progress. 10/17/17 on evaluation today patient appears to be doing very well in regard to his left lateral lower surety ulcer. He has been tolerating the dressing changes without complication. There does not appear to be any evidence of infection he's alternating the Santyl and the triple  antibiotic ointment every other day this seems to be doing well for him. 11/03/17 on evaluation today patient appears to be doing very well in regard to his left lateral lower extremity ulcer. He is been tolerating the dressing changes without complication which is good news. Fortunately there does not appear to be any evidence of infection which is also great news. Overall is doing excellent they are starting to taper down on the prednisone is down to 40 mg at this point it also started topical clobetasol for him. 11/17/17 on evaluation today patient appears to be doing well in regard to his left lateral lower surety ulcer. He's been tolerating the dressing changes without complication. He does note that he is having no pain, no excessive drainage or discharge, and overall he feels like things are going about how he would expect and hope they would. Overall he seems to have no evidence of infection at this time in my opinion which is good news. 12/04/17-He is seen in follow-up evaluation for right lateral lower extremity ulcer. He has been applying topical steroid cream. Today's measurement show slight increase in size. Over the next 2 weeks we will transition to every other day Santyl and steroid cream. He has been encouraged to monitor for changes and notify clinic with any concerns 12/15/17 on evaluation today patient's left lateral motion the ulcer and fortunately is doing worse again at this point. This just since last week to this week has close to doubled in size according to the patient. I did not seeing last week's I do not have a visual to compare this to in our system was also down so we do not have all the charts and at this point. Nonetheless it does have me somewhat concerned in regard to the fact that again he was worried enough about it he has contact the dermatology that placed them back on the full strength, 50 mg a day of the prednisone that he was taken previous. He continues to  alternate using clobetasol along with Santyl at this point. He is obviously somewhat frustrated. [name], [name] ([id number]) 12/22/17 on evaluation today patient appears to be doing a little worse compared to last evaluation. Unfortunately the wound is a little deeper  and slightly larger than the last week's evaluation. With that being said he has made some progress in regard to the irritation surrounding at this time unfortunately despite that progress that's been made he still has a significant issue going on here. 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 For that reason I discussed with him today the possibility of perform a wound culture to ensure there's nothing overtly infected. 01/06/18 on evaluation today patient's wound is larger and deeper than previously evaluated. With that being said it did appear that his wound was infected after my last evaluation with him. Subsequently I did end up prescribing a prescription for Bactrim DS which she has been taking and having no complication with. Fortunately there does not appear to be any evidence of infection at this point in time as far as anything spreading, no want to touch, and overall I feel like things are showing signs of improvement. 01/13/18 on evaluation today patient appears to be even a little larger and deeper than last time. There still muscle exposed in the base of the wound. Nonetheless he does appear to be less erythematous I do believe inflammation is calming down also believe the infection looks like it's probably resolved at this time based on what I'm seeing. No fevers, chills, nausea, or vomiting noted at this time. 01/30/18 on evaluation today patient actually appears to visually look better for the most part. Unfortunately those visually this looks better he does seem to  potentially have what may be an abscess in the muscle that has been noted in the central portion of the wound. This is the first time that I have noted what appears to be fluctuance in the central portion of the muscle. With that being said I'm somewhat more concerned about the fact that this might indicate an abscess formation at this location. I do believe that an ultrasound would be appropriate. This is likely something we need to try to do as soon as possible. He has been switch to mupirocin ointment and he is no longer using the steroid ointment as prescribed by dermatology he sees them again next week he's been decreased from 60 to 40 mg of prednisone. 03/09/18 on evaluation today patient actually appears to be doing a little better compared to last time I saw him. There's not as much erythema surrounding the wound itself. He I did review his most recent infectious disease note which was dated 02/24/18. He saw Dr. Michel Bickers in [place]. 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 Patient is now off of antibiotics and they are continually observing this. There seems to be no change in the past two weeks in my pinion based on what the patient says and what I see today compared to what Dr. Megan Salon likely saw two weeks ago. No fevers, chills, nausea, or vomiting noted at this time. 03/23/18 on evaluation today patient's wound actually appears to be showing signs of improvement which is good news. He is currently still on the Dapsone. He is also working on tapering the prednisone to get off of this and Dr. Lottie Rater is working with him in this regard. Nonetheless overall I feel like the wound is doing well it does appear based on the infectious disease note that I reviewed  from Dr. 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 I did also review the results of his ultrasound of left lower extremity which revealed there was a dentist tissue in the base of the wound without an abscess noted. 04/06/18 on evaluation today the patient's left lateral lower extremity ulcer actually appears to be doing fairly well which is excellent news. There does not appear to be any evidence of infection at this time which is also great news. Overall he still does have a significantly large ulceration although little by little he seems to be making progress. He is down to 10 mg a day of the prednisone. 04/20/18 on evaluation today patient actually appears to be doing excellent at this time in regard to his left lower extremity ulcer. He's making signs of good progress unfortunately this is taking much longer than we would really like to see but nonetheless he is making progress. Fortunately there does not appear to be any evidence of infection at this time. No fevers, chills, nausea, or vomiting noted at this time. The patient has not been using the Santyl due to the cost he hadn't got in this field yet. He's mainly been using the antibiotic ointment topically. Subsequently he also tells me that he really has not been scrubbing in the shower I think this would be helpful again as I told him it doesn't have to be anything too aggressive to even make it believe just enough to keep it free of some of the loose slough and biofilm on the wound surface. 05/11/18 on evaluation today patient's wound appears to be making slow but sure progress in regard to the left lateral lower extremity ulcer. He is been tolerating the dressing changes without complication. Fortunately there does not appear to be any evidence of infection at this time. He is still just using triple antibiotic ointment along with clobetasol occasionally over the area. He never got the Santyl and really does not seem to intend to in my pinion. 06/01/18 on  evaluation today patient appears to be doing a little better in regard to his left lateral lower extremity ulcer. He states that overall he does not feel like he is doing as well with the Dapsone as he did with the prednisone. Nonetheless he sees his dermatologist later today and is gonna talk to them about the possibility of going back on the prednisone. Overall again I believe that the wound would be better if you would utilize Santyl but he really does not seem to be interested in going back to the [place] at this point. He has been using triple antibiotic ointment. 06/15/18 on evaluation today patient's wound actually appears to be doing about the same at this point. Fortunately there is no signs of infection at this time. He has made slight improvements although he continues to not really want to clean the wound bed at this point. He states that he just doesn't mess with it he doesn't want to cause any problems with everything else he has going on. He has been on medication, antibiotics as prescribed by his dermatologist, for a staff infection of his lower extremities which is really drying out now and looking much better he tells me. Fortunately there is no sign of overall infection. 06/29/18 on evaluation today patient appears to be doing well in regard to his left lateral lower surety ulcer all  things considering. Fortunately his staff infection seems to be greatly improved compared to previous. He has no signs of infection and this is drying up quite nicely. He is still the doxycycline for this is no longer on cental, Dapsone, or any of the other medications. His dermatologist has recommended possibility of an infusion but right now he does not want to proceed with that. 07/13/18 on evaluation today patient appears to be doing about the same in regard to his left lateral lower surety ulcer. Fortunately there's no signs of infection at this time which is great news. Unfortunately he still builds up  a significant amount of Slough/biofilm of the surface of the wound he still is not really cleaning this as he should be appropriately. Again I'm able to easily with saline and gauze remove the majority of this on the surface which if you would do this at home would likely be a dramatic improvement for him as far as getting the area to improve. Nonetheless overall I still feel like he is making progress is just very slow. I think Santyl will be of benefit for him as well. Still he has not gotten this as of this point. 07/27/18 on evaluation today patient actually appears to be doing little worse in regards of the erythema around the periwound region of the wound he also tells me that he's been having more drainage currently compared to what he was experiencing last time I saw him. He states not quite as bad as what he had because this was infected previously but nonetheless is still appears to be doing poorly. Fortunately there is no evidence of systemic infection at this point. The patient tells me that he is not going to be able to afford the Santyl. He is still waiting to hear about the infusion therapy with his dermatologist. Apparently she wants an updated colonoscopy first. [name], [name] ([id number]) 08/10/18 on evaluation today patient appears to be doing better in regard to his left lateral lower extremity ulcer. Fortunately he is showing signs of improvement in this regard he's actually been approved for Remicade infusion's as well although this has not been scheduled as of yet. Fortunately there's no signs of active infection at this time in regard to the wound although he is having some issues with infection of the right lower extremity is been seen as dermatologist for this. Fortunately they are definitely still working with him trying to keep things under control. 09/07/18 on evaluation today patient is actually doing rather well in regard to his left lateral lower extremity ulcer. He notes  these actually having some hair grow back on his extremity which is something he has not seen in years. He also tells me that the pain is really not giving them any trouble at this time which is also good news overall she is very pleased with the progress he's using a combination of the mupirocin along with the probate is all mixed. 09/21/18 on evaluation today patient actually appears to be doing fairly well all things considered in regard to his looks from the ulcer. He's been tolerating the dressing changes without complication. Fortunately there's no signs of active infection at this time which is good news he is still on all antibiotics or prevention of the staff infection. He has been on prednisone for time although he states it is gonna contact his dermatologist and see if she put them on a short course due to some irritation that he has going on currently. Fortunately there's  no evidence of any overall worsening this is going very slow I think cental would be something that would be helpful for him although he states that $50 for tube is quite expensive. He therefore is not willing to get that at this point. 10/06/18 on evaluation today patient actually appears to be doing decently well in regard to his left lateral leg ulcer. He's been tolerating the dressing changes without complication. Fortunately there's no signs of active infection at this time. Overall I'm actually rather pleased with the progress he's making although it's slow he doesn't show any signs of infection and he does seem to be making some improvement. I do believe that he may need a switch up and dressings to try to help this to heal more appropriately and quickly. 10/19/18 on evaluation today patient actually appears to be doing better in regard to his left lateral lower extremity ulcer. This is shown signs of having much less Slough buildup at this point due to the fact he has been using the [employer]. Obviously this is very good  news. The overall size of the wound is not dramatically smaller but again the appearance is. 11/02/18 on evaluation today patient actually appears to be doing quite well in regard to his lower [place] ulcer. A lot of the skin around the ulcer is actually somewhat irritating at this point this seems to be more due to the dressing causing irritation from the adhesive that anything else. Fortunately there is no signs of active infection at this time. 11/24/18 on evaluation today patient appears to be doing a little worse in regard to his overall appearance of his lower extremity ulcer. There's more erythema and warmth around the wound unfortunately. He is currently on doxycycline which he has been on for some time. With that being said I'm not sure that seems to be helping with what appears to possibly be an acute cellulitis with regard to his left lower extremity ulcer. No fevers, chills, nausea, or vomiting noted at this time. 12/08/18 on evaluation today patient's wounds actually appears to be doing significantly better compared to his last evaluation. He has been using Santyl along with alternating tripling about appointment as well as the steroid cream seems to be doing quite well and the wound is showing signs of improvement which is excellent news. Fortunately there's no evidence of infection and in fact his culture came back negative with only normal skin flora noted. 12/21/2018 upon evaluation today patient actually appears to be doing excellent with regard to his ulcer. This is actually the best that I have seen it since have been helping to take care of him. It is both smaller as well as less slough noted on the surface of the wound and seems to be showing signs of good improvement with new skin growing from the edges. He has been using just the triamcinolone he does wonder if he can get a refill of that ointment today. 01/04/2019 upon evaluation today patient actually appears to be doing well with  regard to his left lateral lower extremity ulcer. With that being said it does not appear to be that he is doing quite as well as last time as far as progression is concerned. There does not appear to be any signs of infection or significant irritation which is good news. With that being said I do believe that he may benefit from switching to a collagen based dressing based on how clean The wound appears. 01/18/2019 on evaluation today patient actually  appears to be doing well with regard to his wound on the left lower extremity. He is not made a lot of progress compared to where we were previous but nonetheless does seem to be doing okay at this time which is good news. There is no signs of active infection which is also good news. My only concern currently is I do wish we can get him into utilizing the collagen dressing his insurance would not pay for the supplies that we ordered although it appears that he may be able to order this through his supply company that he typically utilizes. This is [employer]. Nonetheless he did try to order it during the office visit today and it appears this did go through. We will see if he can get that it is a different brand but nonetheless he has collagen and I do think will be beneficial. 02/01/2019 on evaluation today patient actually appears to be doing a little worse today in regard to the overall size of his wounds. Fortunately there is no signs of active infection at this time. That is visually. Nonetheless when this is happened before it was due to infection. For that reason were somewhat concerned about that this time as well. 02/08/2019 on evaluation today patient unfortunately appears to be doing slightly worse with regard to his wound upon evaluation today. Is measuring a little deeper and a little larger unfortunately. I am not really sure exactly what is causing this to enlarge he actually did see his dermatologist she is going to see about initiating  Humira for him. Subsequently she also did do steroid injections into the wound itself in the periphery. Nonetheless still nonetheless he seems to be getting a little bit larger he is gone back to just using the steroid cream topically which I think is appropriate. I would say hold off on the collagen for the time being is definitely a good thing to do. Based on the culture results which we finally did get the final result back regarding it shows staph as the bacteria noted again that can be a normal skin bacteria based on the fact however he is having increased drainage and worsening of the wound measurement wise I would go ahead and place him on an antibiotic today I do believe for this. 02/15/2019 on evaluation today patient actually appears to be doing somewhat better in regard to his ulcer. There is no signs of worsening at this time I did review his culture results which showed evidence of Staphylococcus aureus but not MRSA. Again this could just be more related to the normal skin bacteria although he states the drainage has slowed down quite a bit he may have had a mild infection not just colonization. And was much smaller and then since around10/04/2019 on evaluation today patient appears to be doing unfortunately worse as far as the size of the wound. I really feel like that this is steadily getting larger again it had been doing excellent right at the beginning of September we have seen a steady increase in the area of the wound it is almost 2-1/2 times the size it was on September 1. Obviously this is a bad trend this is not wanting to see. For that reason we went back to using just the topical triamcinolone cream which does seem to help with inflammation. I checked him for bacteria by way of culture and nothing showed positive there. I am considering giving him a short course of a tapering steroid Dosepak Aufiero, Kaleb (  [id number]) today to see if that is can be beneficial for him. The  patient is in agreement with giving that a try. 03/08/2019 on evaluation today patient appears to be doing very well in comparison to last evaluation with regard to his lower extremity ulcer. This is showing signs of less inflammation and actually measuring slightly smaller compared to last time every other week over the past month and a half he has been measuring larger larger larger. Nonetheless I do believe that the issue has been inflammation the prednisone does seem to have been beneficial for him which is good news. No fevers, chills, nausea, vomiting, or diarrhea. 03/22/2019 on evaluation today patient appears to be doing about the same with regard to his leg ulcer. He has been tolerating the dressing changes without complication. With that being said the wound seems to be mostly arrested at its current size but really is not making any progress except for when we prescribed the prednisone. He did show some signs of dropping as far as the overall size of the wound during that interval week. Nonetheless this is something he is not on long-term at this point and unfortunately I think he is getting need either this or else the Humira which his dermatologist has discussed try to get approval for. With that being said he will be seeing his dermatologist on the 11th of this month that is November. 04/19/2019 on evaluation today patient appears to be doing really about the same the wound is measuring slightly larger compared to last time I saw him. He has not been into the office since November 2 due to the fact that he unfortunately had Covid as that his entire family. He tells me that it was rough but they did pull-through and he seems to be doing much better. Fortunately there is no signs of active infection at this time. No fevers, chills, nausea, vomiting, or diarrhea. 05/10/2019 on evaluation today patient unfortunately appears to be doing significantly worse as compared to last time I saw him.  He does tell me that he has had his first dose of Humira and actually is scheduled to get the next one in the upcoming week. With that being said he tells me also that in the past several days he has been having a lot of issues with green drainage she showed me a picture this is more blue-green in color. He is also been having issues with increased sloughy buildup and the wound does appear to be larger today. Obviously this is not the direction that we want everything to take based on the starting of his Humira. Nonetheless I think this is definitely a result of likely infection and to be honest I think this is probably Pseudomonas causing the infection based on what I am seeing. 05/24/2019 on evaluation today patient unfortunately appears to be doing significantly worse compared to his prior evaluation with me 2 weeks ago. I did review his culture results which showed that he does have Staph aureus as well as Pseudomonas noted on the culture. Nonetheless the Levaquin that I prescribed for him does not appear to have been appropriate and in fact he tells me he is no longer experiencing the green drainage and discharge that he had at the last visit. Fortunately there is no signs of active infection at this time which is good news although the wound has significantly worsened it in fact is much deeper than it was previous. We have been utilizing up to this point  triamcinolone ointment as the prescription topical of choice but at this time I really feel like that the wound is getting need to be packed in order to appropriately manage this due to the deeper nature of the wound. Therefore something along the lines of an alginate dressing may be more appropriate. 05/31/2019 upon inspection today patient's wound actually showed signs of doing poorly at this point. Unfortunately he just does not seem to be making any good progress despite what we have tried. He actually did go ahead and pick up the Cipro and  start taking that as he was noticing more green drainage he had previously completed the Levaquin that I prescribed for him as well. Nonetheless he missed his appointment for the seventh last week on Wednesday with the wound care center and [facility] where his dermatologist referred him. Obviously I do think a second opinion would be helpful at this point especially in light of the fact that the patient seems to be doing so poorly despite the fact that we have tried everything that I really know how at this point. The only thing that ever seems to have helped him in the past is when he was on high doses of continual steroids that did seem to make a difference for him. 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 06/07/2019 upon evaluation today patient unfortunately appears to be doing worse yet again with regard to his wound. In fact I am starting to question whether or not he may have a fluid pocket in the muscle at this point based on the bulging and the soft appearance to the central portion of the muscle area. There is not anything draining from the muscle itself at this time which is good news but nonetheless the wound is expanding. I am not really seeing any results of the Humira as far as overall wound progression based on what I am seeing at this point. The patient has been referred for second opinion with regard to his wound to the [facility] wound care center by his dermatologist which I definitely am not in opposition to. Unfortunately we tried multiple dressings in the past including collagen, alginate, and at one point even Hydrofera Blue. With that being said he is never really used it for any significant amount of time due to the fact that he often complains of pain associated with these dressings and then will go back to either using  the Santyl which she has done intermittently or more frequently the triamcinolone. He is also using his own compression stockings. We have wrapped him in the past but again that was something else that he really was not a big fan of. 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 He seemed to do much better when he was on the steroids although honestly I understand that the steroids are not necessarily the best medication to be on long-term obviously 06/14/2019 on evaluation today patient appears to be doing  actually a little bit better with regard to the overall appearance with his leg. Unfortunately he does continue to have issues with what appears to be some fluid underneath the muscle although he did see the wound specialty center at [facility] last week their main goals were to see about infusion therapy in place of the Humira as they feel like that is not quite strong enough. They also recommended that we continue with the treatment otherwise as we are they felt like that was appropriate and they are okay with him continuing to follow-up here with [place] in that regard. With that being said they are also sending him to the vein specialist there to see about vein stripping and if that would be of benefit for him. Subsequently they also did not really address whether or not an ultrasound of the muscle area to see if there is anything that needs to be addressed here would be appropriate or not. For that reason I discussed this with him last week I think we may proceed down that road at this point. 06/21/2019 upon evaluation today patient's wound actually appears to be doing slightly better  compared to previous evaluations. I do believe that he has made a difference with regard to the progression here with the use of oral steroids. Again in the past has been the only thing that is really calm things down. He does tell me that from [facility] is gotten a good news from there that there are no further vein stripping that is necessary at this point. I do not have that available for review today although the patient did relay this to me. He also did obtain and have the ultrasound of the wound completed which I did sign off on today. It does appear that there is no fluid collection under the muscle this is likely then just edematous tissue in general. That is also good news. Overall I still believe the inflammation is the main issue here. He did inquire about the possibility of a wound VAC again with the muscle protruding like it is I am not really sure whether the wound VAC is necessarily ideal or not. That is something we will have to consider although I do believe he may need compression wrapping to try to help with edema control which could potentially be of benefit. 06/28/2019 on evaluation today patient appears to be doing slightly better measurement wise although this is not terribly smaller he least seems to be trending towards that direction. With that being said he still seems to have purulent drainage noted in the wound bed at this time. He has been on Levaquin followed by Cipro over the past month. Unfortunately he still seems to have some issues with active infection at this time. I did perform a culture last week in order to evaluate and see if indeed there was still anything going on. Subsequently the culture did come back Clason, Kilian ([id number]) showing Pseudomonas which is consistent with the drainage has been having which is blue-green in color. He also has had an odor that again was somewhat consistent with Pseudomonas as well. Long story short it appears that the culture showed  an intermediate finding with regard to how well the Cipro will work for the Pseudomonas infection. Subsequently being that he does not seem to be clearing up and at best what we are doing is just keeping this at [place] I think he may need to see infectious disease to discuss IV antibiotic options. 07/05/2019 upon evaluation  today patient appears to be doing okay in regard to his leg ulcer. He has been tolerating the dressing changes at this point without complication. Fortunately there is no signs of active infection at this time which is good news. No fevers, chills, nausea, vomiting, or diarrhea. With that being said he does have an appointment with infectious disease tomorrow and his primary care on Wednesday. Again the reason for the infectious disease referral was due to the fact that he did not seem to be fully resolving with the use of oral antibiotics and therefore we were thinking that IV antibiotic therapy may be necessary secondary to the fact that there was an intermediate finding for how effective the Cipro may be. Nonetheless again he has been having a lot of purulent and even green drainage. Fortunately right now that seems to have calmed down over the past week with the reinitiation of the oral antibiotic. Nonetheless we will see what Dr. Megan Salon has to say. 07/12/2019 upon evaluation today patient appears to be doing about the same at this point in regard to his left lower extremity ulcer. Fortunately there is no signs of active infection at this time which is good news I do believe the Levaquin has been beneficial I did review Dr. Hale Bogus note and to be honest I agree that the patient's leg does appear to be doing better currently. What we found in the past as he does not seem to really completely resolve he will stop the antibiotic and then subsequently things will revert back to having issues with blue-green drainage, increased pain, and overall worsening in general. Obviously that  is the reason I sent him back to infectious disease. 07/19/2019 upon evaluation today patient appears to be doing roughly the same in size there is really no dramatic improvement. He has started back on the Levaquin at this point and though he seems to be doing okay he did still have a lot of blue/green drainage noted on evaluation today unfortunately. I think that this is still indicative more likely of a Pseudomonas infection as previously noted and again he does see Dr. Megan Salon in just a couple of days. I do not know that were really able to effectively clear this with just oral antibiotics alone based on what I am seeing currently. Nonetheless we are still continue to try to manage as best we can with regard to the patient and his wound. I do think the wrap was helpful in decreasing the edema which is excellent news. No fevers, chills, nausea, vomiting, or diarrhea. 07/26/2019 upon evaluation today patient appears to be doing slightly better with regard to the overall appearance of the muscle there is no dark discoloration centrally. Fortunately there is no signs of active infection at this time. No fevers, chills, nausea, vomiting, or diarrhea. Patient's wound bed currently the patient did have an appointment with Dr. Megan Salon at infectious disease last week. With that being said Dr. Megan Salon the patient states was still somewhat hesitant about put him on any IV antibiotics he wanted [place] to repeat cultures today and then see where things go going forward. He does look like Dr. Megan Salon because of some improvement the patient did have with the Levaquin wanted [place] to see about repeating cultures. If it indeed grows the Pseudomonas again then he recommended a possibility of considering a PICC line placement and IV antibiotic therapy. He plans to see the patient back in 1 to 2 weeks. 08/02/2019 upon evaluation today patient appears to be  doing poorly with regard to his left lower extremity. We did get the  results of his culture back it shows that he is still showing evidence of Pseudomonas which is consistent with the purulent/blue-green drainage that he has currently. Subsequently the culture also shows that he now is showing resistance to the oral fluoroquinolones which is unfortunate as that was really the only thing to treat the infection prior. I do believe that he is looking like this is going require IV antibiotic therapy to get this under control. Fortunately there is no signs of systemic infection at this time which is good news. The patient does see Dr. Megan Salon tomorrow. 08/09/2019 upon evaluation today patient appears to be doing better with regard to his left lower extremity ulcer in regard to the overall appearance. He is currently on IV antibiotic therapy. As ordered by Dr. Megan Salon. Currently the patient is on ceftazidime which she is going to take for the next 2 weeks and then follow-up for 4 to 5-week appointment with Dr. Megan Salon. The patient started this this past Friday symptoms have not for a total of 3 days currently in full. 08/16/2019 upon evaluation today patient's wound actually does show muscle in the base of the wound but in general does appear to be much better as far as the overall evidence of infection is concerned. In fact I feel like this is for the most part cleared up he still on the IV antibiotics he has not completed the full course yet but I think he is doing much better which is excellent news. 08/23/2019 upon evaluation today patient appears to be doing about the same with regard to his wound at this point. He tells me that he still has pain unfortunately. Fortunately there is no evidence of systemic infection at this time which is great news. There is significant muscle protrusion. 09/13/19 upon evaluation today patient appears to be doing about the same in regard to his leg unfortunately. He still has a lot of drainage coming from the ulceration there is still  muscle exposed. With that being said the patient's last wound culture still showed an intermediate finding with regard to the Pseudomonas he still having the bluish/green drainage as well. Overall I do not know that the wound has completely cleared of infection at this point. Fortunately there is no signs of active infection systemically at this point which is good news. 09/20/2019 upon evaluation today patient's wound actually appears to be doing about the same based on what I am seeing currently. I do not see any signs of systemic infection he still does have evidence of some local infection and drainage. He did see Dr. Megan Salon last week and Dr. Megan Salon states that he probably does need a different IV antibiotic although he does not want to put him on this until the patient begins the Remicade infusion which is actually scheduled for about 10 days out from today on 13 May. Following that time Dr. Megan Salon is good to see him back and then will evaluate the feasibility of starting him on the IV antibiotic therapy once again at that point. I do not disagree with this plan I do believe as Dr. Megan Salon stated in his note that I reviewed today that the patient's issue is multifactorial with the pyoderma being 1 aspect of this that were hoping the Remicade will be helpful for her. In the meantime I think the gentamicin is, helping to keep things under decent okay control in regard to the ulcer.  09/27/2019 upon evaluation today patient appears to be doing about the same with regard to his wound still there is a lot of muscle exposure though he does have some hyper granulation tissue noted around the edge and actually some granulation tissue starting to form over the muscle which is actually good news. Fortunately there is no evidence of active infection which is also good news. His pain is less at this point. 5/21; this is a patient I have not seen in a long time. 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 He has a large wound on the left lateral leg with protruding muscle. He comes in the clinic today showing the same area on his left medial ankle. He says there is been a spot there for some time although we have not previously defined this. Today he has a clearly defined area with slight amount of skin breakdown surrounded by raised areas with a purplish hue in color. This is not painful he says it is irritated. 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 He still has muscle protruding from the lateral portion of his left leg but fortunately the new area that they were concerned about at his last visit does not appear to have opened at this point. He is currently on Remicade infusions and seems to be doing better in my opinion in fact the wound itself seems to be overall much better. The purplish discoloration that he did have seems to have resolved and I think that is a good sign that hopefully the Remicade is doing its job. He does Berthold, Adewale ([id number]) have some biofilm noted over the surface of the wound. 11/01/2019 on evaluation today patient's wound actually appears to be doing excellent at this time. Fortunately there is no evidence of active infection and overall I feel like he is making great progress. The Remicade seems to be due excellent job in my opinion. 11/08/19 evaluation today vision actually appears to be doing quite well with regard to his weight ulcer. He's been tolerating dressing changes without complication. Fortunately there is no evidence of infection. No fevers, chills, nausea, or vomiting noted at this time. Overall states that is having more itching than pain which is actually a good sign in my opinion. 12/13/2019 upon evaluation today patient appears to be doing well today with regard to his wound. He has been tolerating the  dressing changes without complication. Fortunately there is no sign of active infection at this time. No fevers, chills, nausea, vomiting, or diarrhea. Overall I feel like the infusion therapy has been very beneficial for him. 01/06/2020 on evaluation today patient appears to be doing well with regard to his wound. This is measuring smaller and actually looks to be doing better. Fortunately there is no signs of active infection at this point. No fevers, chills, nausea, vomiting, or diarrhea. With that being said he does still have the blue-green drainage but this does not seem to be causing any significant issues currently. He has been using the gentamicin that does seem to be keeping things under decent control at this point. He goes later this morning for his next infusion therapy for the pyoderma which seems to also be very beneficial. 02/07/2020 on evaluation today patient appears to be doing about the same in regard to his wounds currently. Fortunately there is no signs of active infection systemically he does still have evidence  of local infection still using gentamicin. He also is showing some signs of improvement albeit slowly I do feel like we are making some progress here. 02/21/2020 upon evaluation today patient appears to be making some signs of improvement the wound is measuring a little bit smaller which is great news and overall I am very pleased with where he stands currently. He is going to be having infusion therapy treatment on the 15th of this month. Fortunately there is no signs of active infection at this time. 03/13/2020 I do believe patient's wound is actually showing some signs of improvement here which is great news. He has continue with the infusion therapy through rheumatology/dermatology at [facility]. That does seem to be beneficial. I still think he gets as much benefit from this as he did from the prednisone initially but nonetheless obviously this is less harsh on his body that  the prednisone as far as they are concerned. 03/31/2020 on evaluation today patient's wound actually showing signs of some pretty good improvement in regard to the overall appearance of the wound bed. There is still muscle exposed though he does have some epithelial growth around the edges of the wound. Fortunately there is no signs of active infection at this time. No fevers, chills, nausea, vomiting, or diarrhea. 04/24/2020 upon evaluation today patient appears to be doing about the same in regard to his leg ulcer. He has been tolerating the dressing changes without complication. Fortunately there is no signs of active infection at this time. No fevers, chills, nausea, vomiting, or diarrhea. With that being said he still has a lot of irritation from the bandaging around the edges of the wound. We did discuss today the possibility of a referral to plastic surgery. 05/22/2020 on evaluation today patient appears to be doing well with regard to his wounds all things considered. He has not been able to get the Chantix apparently there is a recall nurse that I was unaware of put out by [employer] involuntarily. Nonetheless for now I am and I have to do some research into what may be the best option for him to help with quitting in regard to smoking and we discussed that today. 06/26/2020 upon evaluation today patient appears to be doing well with regard to his wound from the standpoint of infection I do not see any signs of infection at this point. With that being said unfortunately he is still continuing to have issues with muscle exposure and again he is not having a whole lot of new skin growth unfortunately. There does not appear to be any signs of active infection at this time. No fevers, chills, nausea, vomiting, or diarrhea. 07/10/2020 upon evaluation today patient appears to be doing a little bit more poorly currently compared to where he was previous. I am concerned currently about an active infection  that may be getting worse especially in light of the increased size and tenderness of the wound bed. No fevers, chills, nausea, vomiting, or diarrhea. 07/24/2020 upon evaluation today patient appears to be doing poorly in regard to his leg ulcer. He has been tolerating the dressing changes without complication but unfortunately is having a lot of discomfort. 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 Subsequently I think he is going require possibly IV antibiotics to get this under control. I am very concerned about the severity of his infection and the amount of discomfort he is having. 07/31/2020 upon evaluation today patient appears to be doing  about the same in regard to his leg wound. He did see Dr. Megan Salon and Dr. Megan Salon is actually going to start him on IV antibiotics. He goes for the PICC line tomorrow. With that being said there do not have that run for 2 weeks and then see how things are doing and depending on how he is progressing they may extend that a little longer. Nonetheless I am glad this is getting ready to be in place and definitely feel it may help the patient. In the meantime is been using mainly triamcinolone to the wound bed has an anti-inflammatory. 08/07/2020 on evaluation today patient appears to be doing well with regard to his wound compared even last week. In the interim he has gotten the PICC line placed and overall this seems to be doing excellent. There does not appear to be any evidence of infection which is great news systemically although locally of course has had the infection this appears to be improving with the use of the antibiotics. 08/14/2020 upon evaluation today patient's wound actually showing signs of excellent improvement. Overall the irritation has significantly improved the drainage is back down to more of a normal level and his pain is really pretty much nonexistent compared to what it  was. Obviously I think that this is significantly improved secondary to the IV antibiotic therapy which has made all the difference in the world. Again he had a resistant form of Pseudomonas for which oral antibiotics just was not cutting it. Nonetheless I do think that still we need to consider the possibility of a surgical closure for this wound is been open so long and to be honest with muscle exposed I think this can be very hard to get this to close outside of this although definitely were still working to try to do what we can in that regard. 08/21/2020 upon evaluation today patient appears to be doing very well with regard to his wounds on the left lateral lower extremity/calf area. Fortunately there does not appear to be signs of active infection which is great news and overall very pleased with where things stand today. He is actually wrapping up his treatment with IV antibiotics tomorrow. After that we will see where things go from there. 08/28/2020 upon evaluation today patient appears to be doing decently well with regard to his leg ulcer. There does not appear to be any signs of Fukushima, Lot ([id number]) active infection which is great news and overall very pleased with where things stand today. No fevers, chills, nausea, vomiting, or diarrhea. 09/18/2020 upon evaluation today patient appears to be doing well with regard to his infection which I feel like is better. Unfortunately he is not doing as well with regard to the overall size of the wound which is not nearly as good at this point. I feel like that he may be having an issue here with the pyoderma being somewhat out of control. I think that he may benefit from potentially going back and talking to the dermatologist about what to do from the pyoderma standpoint. I am not certain if the infusions are helping nearly as much is what the prednisone did in the past. 10/02/2020 upon evaluation today patient appears to be doing well with regard  to his leg ulcer. He did go to the [facility]. Unfortunately they feel like there is a 10% chance that most that he would be able to heal and that the skin graft would take. Obviously this has led him to not  be able to go down that path as far as treatment is concerned. Nonetheless he does seem to be doing a little bit better with the prednisone that I gave him last time. I think that he may need to discuss with dermatology the possibility of long-term prednisone as that seems to be what is most helpful for him to be perfectly honest. I am not sure the Remicade is really doing the job. 10/17/2020 upon evaluation today patient appears to be doing a little better in regard to his wound. In fact the case has been since we did the prednisone on May 2 for him that we have noticed a little bit of improvement each time we have seen a size wise as well as appearance wise as well as pain wise. I think the prednisone has had a greater effect then the infusion therapy has to be perfectly honest. With that being said the patient also feels significantly better compared to what he was previous. All of this is good news but nonetheless I am still concerned about the fact that again we are really not set up to long-term manage him as far as prednisone is concerned. Obviously there are things that you need to be watched I completely understand the risk of prednisone usage as well. 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 With all that being said I do believe that we can give him another round of the prednisone which she is requesting today because of the improvement that he seen since we did that first round. 10/30/2020 upon evaluation today patient's wound actually is showing signs of doing quite well. There does not appear to be any evidence of infection which is great news and overall very pleased with where things stand today. No fevers, chills, nausea, vomiting,  or diarrhea. He tells me that the prednisone still has seem to have helped he wonders if we can extend that for just a little bit longer. He did not have the appointment with a dermatologist although he did have an infusion appointment last Friday. That was at [facility]. With that being said he tells me he could not do both that as well as the appointment with the physician on the same day therefore that is can have to be rescheduled. I really want to see if there is anything they feel like that could be done differently to try to help this out as I am not really certain that the infusions are helping significantly here. 11/13/2020 upon evaluation today patient unfortunately appears to be doing somewhat poorly in regard to his wound I feel like this is actually worsening from the standpoint of the pyoderma spreading. I still feel like that he may need something different as far as trying to manage this going forward. Again we did the prednisone unfortunately his blood sugars are not doing so well because of this. Nonetheless I believe that the patient likely needs to try topical steroid. We have done triamcinolone for a while I think going with something stronger such as clobetasol could be beneficial again this is not something I do lightly I discussed this with the patient that again this does not normally put underneath an occlusive dressing. Nonetheless I think a thin film as such could help with some of the stronger anti-inflammatory effects. We discussed this today. He would like to try to give this a trial for the next couple weeks. I definitely think that is something  that we can do. Evaluate7/03/2021 and today patient's wound bed actually showed signs of doing really about the same. There was a little expansion of the size of the wound and that leading edge that we done looking out although the clobetasol does seem to have slowed this down a bit in my opinion. There is just 1 small area that still  seems to be progressing based on what I see. Nonetheless I am concerned about the fact this does not seem to be improving if anything seems to be doing a little bit worse. I do not know that the infusions are really helping him much as next infusion is August 5 his appointment with dermatology is July 25. Either way I really think that we need to have a conversation potentially about this and I am actually going to see if I can talk with Dr. Lillia [facility] in order to see where things stand as well. 12/11/2020 upon evaluation today patient appears to be doing worse in regard to his leg ulcer. Unfortunately I just do not think this is making the progress that I would like to see at this point. Honestly he does have an appointment with dermatology and this is in 2 days. I am wondering what they may have to offer to help with this. Right now what I am seeing is that he is continuing to show signs of worsening little by little. Obviously that is not great at all. Is the exact opposite of what we are looking for. 12/18/2020 upon evaluation today patient appears to be doing a little better in regard to his wound. The dermatologist actually did do some steroid injections into the wound which does seem to have been beneficial in my opinion. That was on the 25th already this looks a little better to me than last time I saw him. With that being said we did do a culture and this did show that he has Staph aureus noted in abundance in the wound. With that being said I do think that getting him on an oral antibiotic would be appropriate as well. Also think we can compression wrap and this will make a difference as well. 12/28/2020 upon evaluation today patient's wound is actually showing signs of doing much better. I do believe the compression wrap is helping he has a lot of drainage but to be honest I think that the compression is helping to some degree in this regard as well as not draining through which is also good  news. No fevers, chills, nausea, vomiting, or diarrhea. 01/04/2021 upon evaluation today patient appears to be doing well with regard to his wound. Overall things seem to be doing quite well. He did have a little bit of reaction to the CarboFlex Sorbact he will be using that any longer. With that being said he is controlled as far as the drainage is concerned overall and seems to be doing quite well. I do not see any signs of active infection at this time which is great news. No fevers, chills, nausea, vomiting, or diarrhea. 01/11/2021 upon evaluation today patient appears to be doing well with regard to his wounds. He has been tolerating the dressing changes without complication. Fortunately there does not appear to be any signs of active infection at this time which is great news. Overall I am extremely pleased with where we stand currently. No fevers, chills, nausea, vomiting, or diarrhea. Where using clobetasol in the wound bed he has a lot of new skin growth which is  awesome as well. 01/18/2021 upon evaluation today patient appears to be doing very well in regard to his leg ulcer. He has been tolerating the dressing changes without complication. Fortunately there does not appear to be any signs of active infection which is great news. In general I think that he is making excellent progress 01/25/2021 upon evaluation today patient appears to be doing well with regard to his wound on the leg. I am actually extremely pleased with where things stand today. There does not appear to be any signs of active infection which is great news and overall I think that we are definitely headed in the appropriate direction based on what I am seeing currently. There does not appear to be any signs of active infection also excellent news. 02/06/2021 upon evaluation today patient appears to be doing well with regard to his wound. Overall visually this is showing signs of significant Flaim, Mehul ([id number]) improvement  which is great news. I do not see any signs of active infection systemically which is great even locally I do not think that we are seeing any major complications here. We did do fluorescence imaging with the [employer] today. The patient does have some odor and drainage noted and again this is something that I think would benefit him to probably come more frequently for nurse visits. 02/19/2021 upon evaluation today patient actually appears to be doing quite well in regard to his wound. He has been tolerating the dressing changes without complication and overall I think that this is making excellent progress. I do not see any evidence of active infection at this point which is great news as well. No fevers, chills, nausea, vomiting, or diarrhea. 10/10; wound is made nice progress healthy granulation with a nice rim of epithelialization which seems to be expanding even from last week he has a deeper area in the inferior part of the more distal part of the wound with not quite as healthy as surface. This area will need to be followed. Using clobetasol and Hydrofera Blue 03/05/2021 upon evaluation today patient appears to be doing very well in regard to his leg ulcer. He has been tolerating dressing changes without complication. Fortunately there does not appear to be any signs of active infection which is great news and overall I am extremely pleased with where we stand currently. 03/12/2021 upon evaluation today patient appears to be doing well with regard to his wound in fact this is extremely extremely good based on what we are seeing today there does not appear to be any signs of active infection and overall I think that he is doing awesome from the standpoint of healing in general. I am extremely pleased with how things seem to be progressing with regard to this pyoderma. Clobetasol has done wonders for him. I think the compression wrapping has also been of great benefit. 03/19/2021 upon  evaluation today patient appears to be doing well with regard to his wound. He is tolerating the dressing changes without complication. In fact I feel like that he is actually making excellent progress at this point based on what I am seeing. No fevers, chills, nausea, vomiting, or diarrhea. 03/26/2021 upon evaluation today patient appears to be doing well with regard to his wound. This again is measuring smaller and looking better. Again the progress is slow but nonetheless continual with what we have been seeing. I do believe that the current plan is doing awesome for him. 04/09/2021 upon evaluation today patient appears to be doing  well with regard to his leg ulcer. This is showing signs of excellent improvement the muscle is completely closed over and there does not appear to be any evidence of inflammation at this point his drainage is significantly improved. Overall I think that he would be a good candidate for looking into a skin substitute at this point as well. We will get a look into some approvals in that regard. Potentially TheraSkin as well as Apligraf could both be considered just depending on insurance coverage. 04/16/2021 upon evaluation today patient appears to be doing well at this point. He has been approved for the Apligraf which we could definitely order although I would like to try to get the TheraSkin approved if at all possible. I did fax notes into them today and I Georgina Peer try to give a call as well. Overall the wound appears to be doing decently well today. 04/20/2021 upon evaluation today patient actually appears to be doing quite well in regard to his wound with that being said we are trying to see what we can do about speeding up the healing process. For that reason we did discuss the possibility of a skin substitute. We got the Apligraf approved. For that reason we will get a go ahead and see what we can do with the Apligraf at this point. I am still trying to get the  TheraSkin approved but I have not heard anything from the insurance company yet I have called and talked to them earlier in the week and to be honest this was about half an hour that I spent on the phone they told me I would hear something within 10-15 business days. 04/27/2021 upon evaluation today patient appears to be doing excellent in regard to his wound. I do believe the Apligraf has been beneficial. With that being said he has had a little bit of increased pain has been a little bit concerned. I do want to go ahead and apply his steroid cream today the clobetasol and then put the Apligraf over I just do not think I want to risk not putting the clobetasol on based on what is been going on here currently. Patient voiced understanding. 05/04/2021 upon evaluation today patient is making excellent improvement. Overall there is definitely decrease in the size of the wound today and I am very pleased in that regard. I do not see any signs of active infection locally nor systemically at this point. No fevers, chills, nausea, vomiting, or diarrhea. 05/11/2021 upon evaluation today patient appears to be doing well with regard to his wound. He has been tolerating Apligraf's without complication and is making excellent progress this is application #4 today. 12/30; patient here for Apligraf application. Appears to be doing well small wound there has been a lot of healing 05/28/2021 upon evaluation today patient appears to be doing well with regard to his wound. Has been tolerating the dressing changes without complication. Fortunately I do not see any signs of active infection locally nor systemically at this point which is great news. He is done with the Apligraf and the first round and overall this has filled in quite nicely I am not even certain he needs any additional at the moment. I would actually try to go back to clobetasol with [facility] which previously was doing well for him. 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 He is using clobetasol and Hydrofera Blue. Surface area of the wound is smaller and the  surface looks very healthy. 06/11/2021 upon evaluation today patient actually appears to be showing signs of good improvement which is great news. Fortunately I do not see any signs of active infection at this time which is also excellent news. I do believe that the patient is doing well with the clobetasol and Hydrofera Blue. He does have some irritation and itching around the rest of the leg will use some triamcinolone over this area. 06/18/2021 upon evaluation today patient appears to be doing well with regard to his wound. He is showing signs of excellent improvement and overall I am extremely pleased with where we stand today. We are moving in the right direction. 06/25/2021 upon evaluation today patient appears to be doing well with regard to his wound. In fact this is doing also not showing signs of excellent improvement overall very pleased with where we stand today. I do not see any evidence of active infection locally or systemically at this time which is also great news. 07/02/2021 upon evaluation patient's wound bed actually showed signs of good granulation and epithelization at this point. And this is measuring significantly smaller and overall I am extremely pleased with where we stand today. No fevers, chills, nausea, vomiting, or diarrhea. 07/09/2021 upon evaluation patient's wound bed actually showed signs of good granulation and epithelization at this point. Fortunately there does not appear to be any evidence of active infection locally or systemically which is great news and overall I am extremely pleased with where we FUMIO, VANDAM ([id number]) stand at this point. 07/16/2021 upon evaluation today patient appears to be doing well with regard to his wound all things considered although it is maybe a little bit larger than  what its been. This does have me a little concerned. Actually question about whether anything is changed and initially he told me know. In fact it was not until the end of the visit that he actually did mention that he had missed his infusion in January. This very well may be what is because the difference is also seeing currently. That definitely has seem to been helping more recently. 07/23/2021 upon evaluation today patient's wound is yet again larger despite the switch to a collagen which was not beneficial. Subsequently I am going to at this point check on a couple things. First and foremost I do want to go ahead and do a culture. I think that there is a chance he could have a low-lying infection that may be part of the issue here. Subsequently I am also probably can go ahead and place him on Bactrim DS which I think is a good option and has done well with them in the past. Again if this is an infection that should hopefully help to turn things around. With all that being said I do believe that the patient should hopefully be able to turn this around with reinitiation of the infusion therapy which will be going on this week and subsequently getting on the antibiotic. 3/13; patient presents for follow-up. He has no issues or complaints today. He states he is currently on oral antibiotics for previous culture done in the office. 08/06/2021 upon evaluation today patient appears to be doing well with regard to his wound. I am actually seeing signs of improvement which is great news. I think that he is on a better track he did receive his infusion as well which is great news and overall I think that this should hopefully get him back on track as far as healing  is concerned. He is not having any pain and I do not see any signs of inflammation which is great news. 08/13/2021 upon evaluation today patient appears to be doing excellent in regard to his wound on the leg. He has been tolerating the  dressing changes without complication. Fortunately I do not see any signs of infection I do believe he is making good progress here and I think her back on track overall the tissue is greatly improved compared to what we have been. 08/20/2021 upon evaluation today patient appears to be doing excellent in regard to his wound. He has been tolerating the dressing changes without complication. Fortunately I do not see any evidence of infection at this time locally or systemically which is great news and overall very pleased with where we stand. 08-31-2021 upon evaluation today patient appears to be doing excellent in regard to his wound. Overall even with the vacation and extra walking he seems to have really done well. Fortunately I do not see any evidence of active infection locally or systemically which is great news and overall I think we are headed in the right direction. 09-10-2021 upon evaluation today patient's wound is showing signs of improvement. This is a small improvement but nonetheless we seem to be making progress here. I am very pleased in that regard. There does not appear to be any signs of infection and overall things are doing quite nicely. Upon inspection patient's wound bed actually showed signs of good granulation and epithelization at this point. There was some irritation around the edges of the wound where the good skin was it almost appears that something was rubbing or else is was due from drainage and irritation. Nonetheless I am going to see about putting a little bit of zinc just around the wound opening in order to protect the skin from being damaged. The patient is in agreement with that plan. 5/8; its been a while since I have seen this wound and it looks considerably better although still with some depth. Per our intake nurse the dimensions have improved patient has been using a thin layer of clobetasol, Hydrofera Blue under 4-layer compression. He appears to be  making progress 10-01-2021 upon evaluation today patient appears to be doing well with regard to his wound although it is somewhat stalled I feel like were basically at a standstill here. He has previously had Apligraf which did very well for him to be honest. I do believe that he may benefit from a repeat of the Apligraf. In fact it got so small at that point but I do not think we even used all of the applications that were recommended as this had improved to such a degree. Nonetheless I do believe that the patient currently could benefit from repeat treatment with the Apligraf due to the improvement this that we did see. He is not opposed to this and subsequently working to look into getting approval to reinstitute that treatment. 10-08-2021 upon evaluation today patient appears to be doing well with regard to his wound. Overall I feel like he is doing a little bit better from the standpoint of irritation at this time which is great news. I do not see any signs of active infection locally or systemically which is great news as well. No fevers, chills, nausea, vomiting, or diarrhea. 10-12-2021 upon evaluation today patient appears to be doing well with regard to his wound stable although we are can initiate treatment with Apligraf today. I am hoping this will  stimulate things to improve. Patient is in agreement with that plan. 10-19-2028 upon evaluation today patient appears to be doing well with regard to his wound. He has been tolerating the dressing changes without complication. Fortunately there does not appear to be any evidence of active infection locally or systemically at this time which is great news. No fevers, chills, nausea, vomiting, or diarrhea. I do believe the Apligraf has improved the overall appearance of the wound bed receiving some definite improvements here. 10-26-2021 upon evaluation today patient's wound is actually shown some signs of really good improvement I am very pleased in  this regard. There is a little bit of slough and biofilm buildup that is stuck to the surface of the wound I am getting very lightly debrided in order to clear this away and then subsequently will get a continue with the Apligraf application today. This is #3. 11-09-2021 upon evaluation today patient appears to be doing well currently in regard to his wound. This is actually showing signs of significant improvement which is great news and overall I think the Apligraf is doing an amazing job. This wound is significantly smaller compared to the last time I saw him. This will be Apligraf application #4. 07-22-7423 upon evaluation today patient appears to be doing well currently in regard to his wound he is actually showing signs of excellent improvement which is great news and overall I am extremely pleased with where we stand today. I do not see any evidence of active infection locally or systemically at this time which is great news. No fevers, chills, nausea, vomiting, or diarrhea. I believe the Apligraf is doing an awesome job and in fact I [place] see about getting an extension of application which hopefully should not be a problem. Especially in light of how much improvement he has had with regards to the size of the wound with treatment. This is application #5 today. TAHJI, [place] ([id number]) 12-07-2021 upon evaluation today patient appears to be doing excellent in regard to his wound has been tolerating the dressing changes without complication. Fortunately there does not appear to be any signs of active infection locally or systemically at this time. 12-21-2021 upon evaluation today patient appears to be doing well currently in regard to his wound. We have gotten approval for a repeat Apligraf treatment. I think that is going to be a good way to wrap this up I really feel like we will likely be able to get the wound closed with the additional treatments that have been improved. 01-04-2022 upon  evaluation patient appears to be doing excellent in regard to his wound. He is tolerating the dressing changes without complication and is measuring much better today as well. I am very pleased with where we stand. I am not even certain we will get have to use all 5 of the reapproved Apligraf but we will see how things go right now I am extremely pleased with the progress that is been made. Objective Constitutional Well-nourished and well-hydrated in no acute distress. Vitals Time Taken: 8:38 AM, Height: 71 in, Weight: 338 lbs, BMI: 47.1, Temperature: 98.2 F, Pulse: 94 bpm, Respiratory Rate: 18 breaths/min, Blood Pressure: 131/81 mmHg. Respiratory normal breathing without difficulty. Psychiatric this patient is able to make decisions and demonstrates good insight into disease process. Alert and Oriented x 3. pleasant and cooperative. General Notes: Then applyPatient's wound bed did not require any sharp debridement at all today it seems to be doing quite well. Fortunately I was able to  clean away some of the necrotic debris just with saline and gauze I then did proceed to reapply the Apligraf this includes the clobetasol foot down first zinc around the edges of the wound to help protect the good skin. Next I applied the Apligraf itself to the wound bed and following this after applying Skin-Prep to use the Mepitel to secure in place along with Steri-Strips. He tolerated the procedure today without complication. Integumentary (Hair, Skin) Wound #1 status is Open. Original cause of wound was Gradually Appeared. The date acquired was: 11/18/2015. The wound has been in treatment 265 weeks. The wound is located on the Left,Lateral Lower Leg. The wound measures 1.9cm length x 1.1cm width x 0.1cm depth; 1.641cm^2 area and 0.164cm^3 volume. There is Fat Layer (Subcutaneous Tissue) exposed. There is a medium amount of serosanguineous drainage noted. There is large (67-100%) red granulation within the  wound bed. There is a small (1-33%) amount of necrotic tissue within the wound bed including Adherent Slough. 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 A skin graft procedure using a bioengineered skin substitute/cellular or tissue based product was performed by Tommie Sams., PA-C with the following instrument(s): Blade. Apligraf was applied and secured with Steri-Strips. 29 sq cm of product was utilized and 15 sq cm was wasted due to [place]. Post Application, BOLUS was applied. A Time Out was conducted at 09:00, prior to the start of the procedure. The procedure was tolerated well with a pain level of 0 throughout and a pain level of 0 following the procedure. MITCHEAL, SWEETIN ([id number]) Post procedure Diagnosis Wound #1: Same as Pre-Procedure . Pre-procedure diagnosis of Wound #1 is a Pyoderma located on the Left,Lateral Lower Leg . There was a Four Layer Compression Therapy Procedure with a pre-treatment ABI of 1.2 by Massie Kluver. Post procedure Diagnosis Wound #1: Same as Pre-Procedure Plan Follow-up Appointments: Return Appointment in 1 week. Nurse Visit as needed - twice a week Bathing/ Shower/ Hygiene: Clean wound with Normal Saline or wound cleanser. May shower with wound dressing protected with water repellent cover or cast protector. No tub bath. Cellular or Tissue Based Products: Wound #1 Left,Lateral Lower Leg: Cellular or Tissue Based Product applied to wound bed; including contact layer, fixation with steri-strips, dry gauze and cover dressing. (DO NOT REMOVE). - Apligraf #7 applied Edema Control - Lymphedema /  Segmental Compressive Device / Other: Optional: One layer of unna paste to top of compression wrap (to act as an anchor). - He needs this to hold it up Elevate, Exercise Daily and Avoid Standing for Long Periods of Time. Elevate legs to the level of the heart and pump ankles as often as possible Elevate leg(s) parallel to the floor when sitting. WOUND #1: - Lower Leg Wound Laterality: Left, Lateral Cleanser: Wound Cleanser 3 x Per Week/30 Days Discharge Instructions: Wash your hands with soap and water. Remove old dressing, discard into plastic bag and place into trash. Cleanse the wound with Wound Cleanser prior to applying a clean dressing using gauze sponges, not tissues or cotton balls. Do not scrub or use excessive force. Pat dry using gauze sponges, not tissue or cotton balls. Peri-Wound Care: Desitin Maximum Strength Ointment 4 (oz) 3 x Per Week/30 Days Discharge  Instructions: Apply around wound edges to avoid maceration Peri-Wound Care: Triamcinolone Acetonide Cream, 0.1%, 15 (g) tube 3 x Per Week/30 Days Discharge Instructions: Apply to leg Topical: Clobetasol Propionate ointment 0.05%, 60 (g) tube 3 x Per Week/30 Days Discharge Instructions: Pt to bring to appt- applied to wound bed Primary Dressing: Apligraf 3 x Per Week/30 Days Secondary Dressing: (NON-BORDER) Zetuvit Plus Silicone NON-BORDER 5x5 (in/in) 3 x Per Week/30 Days Compression Wrap: Medichoice 4 layer Compression System, 35-40 mmHG 3 x Per Week/30 Days Discharge Instructions: Apply multi-layer wrap as directed. 1. I am good recommend that we go ahead and continue with the Apligraf treatments which seems to be doing quite well and very pleased in that regard. 2. I am also can recommend that we have the patient continue with the compression wrapping or doing a 4-layer compression wrap which is really keeping things under great control as well. We will see patient back for reevaluation in 2 weeks here in the clinic. If  anything worsens or changes patient will contact our office for additional recommendations. He will have a nurse visit in between at multiple intervals in order to keep this clean and progressing in the right direction. Electronic Signature(s) Signed: 01/04/2022 10:11:12 AM By: Worthy Keeler PA-C Entered By: Worthy Keeler on 01/04/2022 10:11:12 [name] ([id number]) -------------------------------------------------------------------------------- SuperBill Details Patient Name: [name] Date of Service: 01/04/2022 Medical Record Number: [id number] Patient Account Number: [id number] Date of Birth/Sex: October 25, 1980 (40 y.o. M) Treating RN: [name] Primary Care Provider: [name] Other Clinician: Massie Kluver Referring Provider: [name] Treating Provider/Extender: [name] Cliche in Treatment: 265 Diagnosis Coding ICD-10 Codes Code Description I87.2 Venous insufficiency (chronic) (peripheral) L97.222 Non-pressure chronic ulcer of left calf with fat layer exposed E11.622 Type 2 diabetes mellitus with other skin ulcer L88 Pyoderma gangrenosum F17.208 Nicotine dependence, unspecified, with other nicotine-induced disorders Facility Procedures CPT4 Code: 61443154 Description: [phone] (Facility Use Only) Apligraf 44 SQ CM Modifier: Quantity: [place] Code: [id number] Description: 67124 - SKIN SUB GRAFT TRNK/ARM/LEG Modifier: Quantity: 1 CPT4 Code: Description: ICD-10 Diagnosis Description L97.222 Non-pressure chronic ulcer of left calf with fat layer exposed Modifier: Quantity: Physician Procedures CPT4 Code: 5809983 Description: 38250 - WC PHYS SKIN SUB GRAFT TRNK/ARM/LEG Modifier: Quantity: 1 CPT4 Code: Description: ICD-10 Diagnosis Description L97.222 Non-pressure chronic ulcer of left calf with fat layer exposed Modifier: Quantity: Electronic Signature(s) Signed: 01/04/2022 10:12:05 AM By: Worthy Keeler PA-C Entered By: Worthy Keeler on  01/04/2022 10:12:05

## 2022-01-05 NOTE — Progress Notes (Signed)
[name], [name] ([id number]) Visit Report for 01/04/2022 Arrival Information Details Patient Name: [name], [name] Date of Service: 01/04/2022 8:15 AM Medical Record Number: [id number] Patient Account Number: [id number] Date of Birth/Sex: 1981/03/29 (41 y.o. M) Treating RN: [name] Primary Care Elvena Oyer: [name] Other Clinician: Massie Kluver Referring Alazia Crocket: [name] Treating Milani Lowenstein/Extender: [name] Cliche in Treatment: 62 Visit Information History Since Last Visit All ordered tests and consults were completed: No Patient Arrived: Ambulatory Added or deleted any medications: No Arrival Time: 08:37 Any new allergies or adverse reactions: No Transfer Assistance: None Had a fall or experienced change in No Patient Requires Transmission-Based No activities of daily living that may affect Precautions: risk of falls: Patient Has Alerts: Yes Hospitalized since last visit: No Patient Alerts: Patient has reaction Pain Present Now: No to silver dressings. Electronic Signature(s) Signed: 01/04/2022 12:34:53 PM By: Worthy Keeler PA-C Entered By: Worthy Keeler on 01/04/2022 08:37:53 Ciulla, [name] ([id number]) -------------------------------------------------------------------------------- Clinic Level of Care Assessment Details Patient Name: [name], [name] Date of Service: 01/04/2022 8:15 AM Medical Record Number: [id number] Patient Account Number: [id number] Date of Birth/Sex: 1980/09/14 (40 y.o. M) Treating RN: [name] Primary Care Maryln Eastham: [name] Other Clinician: Massie Kluver Referring Ariele [name]: [name] Treating Guinn Delarosa/Extender: [name] Cliche in Treatment: 265 Clinic Level of Care Assessment Items TOOL 1 Quantity Score []  - Use when EandM and Procedure is performed on INITIAL visit 0 ASSESSMENTS - Nursing Assessment / Reassessment []  - General Physical Exam (combine w/ comprehensive assessment (listed just below) when  performed on new 0 pt. evals) []  - 0 Comprehensive Assessment (HX, ROS, Risk Assessments, Wounds Hx, etc.) ASSESSMENTS - Wound and Skin Assessment / Reassessment []  - Dermatologic / Skin Assessment (not related to wound area) 0 ASSESSMENTS - Ostomy and/or Continence Assessment and Care []  - Incontinence Assessment and Management 0 []  - 0 Ostomy Care Assessment and Management (repouching, etc.) PROCESS - Coordination of Care []  - Simple Patient / Family Education for ongoing care 0 []  - 0 Complex (extensive) Patient / Family Education for ongoing care []  - 0 Staff obtains Programmer, systems, Records, Test Results / Process Orders []  - 0 Staff telephones HHA, Nursing Homes / Clarify orders / etc []  - 0 Routine Transfer to another Facility (non-emergent condition) []  - 0 Routine Hospital Admission (non-emergent condition) []  - 0 New Admissions / Biomedical engineer / Ordering NPWT, Apligraf, etc. []  - 0 Emergency Hospital Admission (emergent condition) PROCESS - Special Needs []  - Pediatric / Minor Patient Management 0 []  - 0 Isolation Patient Management []  - 0 Hearing / Language / Visual special needs []  - 0 Assessment of Community assistance (transportation, D/C planning, etc.) []  - 0 Additional assistance / Altered mentation []  - 0 Support Surface(s) Assessment (bed, cushion, seat, etc.) INTERVENTIONS - Miscellaneous []  - External ear exam 0 []  - 0 Patient Transfer (multiple staff / Civil Service fast streamer / Similar devices) []  - 0 Simple Staple / Suture removal (25 or less) []  - 0 Complex Staple / Suture removal (26 or more) []  - 0 Hypo/Hyperglycemic Management (do not check if billed separately) []  - 0 Ankle / Brachial Index (ABI) - do not check if billed separately Has the patient been seen at the hospital within the last three years: Yes Total Score: 0 Level Of Care: ____ [name] ([id number]) Electronic Signature(s) Signed: 01/04/2022 12:34:53 PM By: Worthy Keeler  PA-C Entered By: Worthy Keeler on 01/04/2022 09:05:38 Bleich, [name] ([id number]) -------------------------------------------------------------------------------- Compression Therapy Details Patient Name: [name] Date  of Service: 01/04/2022 8:15 AM Medical Record Number: [id number] Patient Account Number: [id number] Date of Birth/Sex: 1980/11/25 (40 y.o. M) Treating RN: [name] Primary Care Tirza Senteno: [name] Other Clinician: Massie Kluver Referring Anel Purohit: [name] Treating Austyn Perriello/Extender: Jeri Cos Weeks in Treatment: 265 Compression Therapy Performed for Wound Assessment: Wound #1 Left,Lateral Lower Leg Performed By: Lenice Pressman, Angie, Compression Type: Four Layer Pre Treatment ABI: 1.2 Post Procedure Diagnosis Same as Pre-procedure Electronic Signature(s) Signed: 01/04/2022 12:34:53 PM By: Worthy Keeler PA-C Entered By: Worthy Keeler on 01/04/2022 Dante, Jose ([id number]) -------------------------------------------------------------------------------- Encounter Discharge Information Details Patient Name: [name] Date of Service: 01/04/2022 8:15 AM Medical Record Number: [id number] Patient Account Number: [id number] Date of Birth/Sex: 1980-12-12 (40 y.o. M) Treating RN: [name] Primary Care Kaydan Wong: [name] Other Clinician: Massie Kluver Referring Nylia Gavina: [name] Treating Sandar Krinke/Extender: [name] Cliche in Treatment: 265 Encounter Discharge Information Items Post Procedure Vitals Discharge Condition: Stable Temperature (F): 98.2 Ambulatory Status: Ambulatory Pulse (bpm): 94 Discharge Destination: Home Respiratory Rate (breaths/min): 16 Transportation: Private Auto Blood Pressure (mmHg): 131/81 Accompanied By: self Schedule Follow-up Appointment: Yes Clinical Summary of Care: Electronic Signature(s) Signed: 01/04/2022 12:34:53 PM By: Worthy Keeler PA-C Entered By: Worthy Keeler on  01/04/2022 09:19:33 [name] ([id number]) -------------------------------------------------------------------------------- Lower Extremity Assessment Details Patient Name: [name] Date of Service: 01/04/2022 8:15 AM Medical Record Number: [id number] Patient Account Number: [id number] Date of Birth/Sex: [birth year] (40 y.o. M) Treating RN: [name] Primary Care Kaylana Fenstermacher: [name] Other Clinician: Massie Kluver Referring Ceasar Decandia: [name] Treating Cabrini Ruggieri/Extender: [name] Cliche in Treatment: 265 Edema Assessment Assessed: [Left: Yes] [Right: No] Edema: [Left: Ye] [Right: s] Calf Left: Right: Point of Measurement: 35 cm From Medial Instep 46 cm Ankle Left: Right: Point of Measurement: 10 cm From Medial Instep 28 cm Vascular Assessment Pulses: Dorsalis Pedis Palpable: [Left:Yes] Posterior Tibial Palpable: [Left:Yes] Electronic Signature(s) Signed: 01/04/2022 10:15:18 AM By: [name], [name], RN, [name], [name], [name] Signed: 01/04/2022 12:34:53 PM By: Worthy Keeler PA-C Entered By: Worthy Keeler on 01/04/2022 08:49:01 Jaspers, Geraldine ([id number]) -------------------------------------------------------------------------------- Multi Wound Chart Details Patient Name: [name] Date of Service: 01/04/2022 8:15 AM Medical Record Number: [id number] Patient Account Number: [id number] Date of Birth/Sex: 07-22-1980 (40 y.o. M) Treating RN: [name] Primary Care Barack Nicodemus: [name] Other Clinician: Massie Kluver Referring Veta Dambrosia: [name] Treating Yarel Kilcrease/Extender: [name] Cliche in Treatment: 265 Vital Signs Height(in): 71 Pulse(bpm): 52 Weight(lbs): 338 Blood Pressure(mmHg): 131/81 Body Mass Index(BMI): 47.1 Temperature(F): 98.2 Respiratory Rate(breaths/min): 18 Photos: [N/A:N/A] Wound Location: Left, Lateral Lower Leg N/A N/A Wounding Event: Gradually Appeared N/A N/A Primary Etiology: Pyoderma N/A N/A Comorbid History:  Sleep Apnea, Hypertension, Colitis N/A N/A Date Acquired: 11/18/2015 N/A N/A Weeks of Treatment: 265 N/A N/A Wound Status: Open N/A N/A Wound Recurrence: No N/A N/A Measurements L x W x D (cm) 1.9x1.1x0.1 N/A N/A Area (cm) : 1.641 N/A N/A Volume (cm) : 0.164 N/A N/A % Reduction in Area: 66.60% N/A N/A % Reduction in Volume: 95.80% N/A N/A Classification: Full Thickness With Exposed N/A N/A Support Structures Exudate Amount: Medium N/A N/A Exudate Type: Serosanguineous N/A N/A Exudate Color: red, brown N/A N/A Granulation Amount: Large (67-100%) N/A N/A Granulation Quality: Red N/A N/A Necrotic Amount: Small (1-33%) N/A N/A Exposed Structures: Fat Layer (Subcutaneous Tissue): N/A N/A Yes Fascia: No Tendon: No Muscle: No Joint: No Bone: No Epithelialization: Medium (34-66%) N/A N/A Treatment Notes Electronic Signature(s) Signed: 01/04/2022 12:34:53 PM By: Worthy Keeler PA-C Entered By:  Worthy Keeler on 01/04/2022 08:49:34 DENNIES, COATE ([id number]) -------------------------------------------------------------------------------- Multi-Disciplinary Care Plan Details Patient Name: LEVERN, PITTER Date of Service: 01/04/2022 8:15 AM Medical Record Number: [id number] Patient Account Number: [id number] Date of Birth/Sex: 1981/01/28 (41 y.o. M) Treating RN: [name] Primary Care Haly Feher: [name] Other Clinician: Massie Kluver Referring Efrem Pitstick: [name] Treating Emberlin Verner/Extender: [name] Cliche in Treatment: [place] reviewed with physician Active Inactive Electronic Signature(s) Signed: 01/04/2022 10:15:18 AM By: [name] [name], RN, [name], [name], [name] Signed: 01/04/2022 12:34:53 PM By: Worthy Keeler PA-C Entered By: Worthy Keeler on 01/04/2022 08:49:19 AVROM, ROBARTS ([id number]) -------------------------------------------------------------------------------- Pain Assessment Details Patient Name: [name] Date of Service:  01/04/2022 8:15 AM Medical Record Number: [id number] Patient Account Number: [id number] Date of Birth/Sex: Sep 19, 1980 (40 y.o. M) Treating RN: [name] Primary Care Emmanuela Ghazi: [name] Other Clinician: Massie Kluver Referring Keshayla Schrum: [name] Treating Claira Jeter/Extender: [name] Cliche in Treatment: 265 Active Problems Location of Pain Severity and Description of Pain Patient Has Paino No Site Locations Pain Management and Medication Current Pain Management: Electronic Signature(s) Signed: 01/04/2022 10:15:18 AM By: [name], [name], RN, [name], [name], [name] Signed: 01/04/2022 12:34:53 PM By: Worthy Keeler PA-C Entered By: Worthy Keeler on 01/04/2022 08:40:23 CODIE, KROGH ([id number]) -------------------------------------------------------------------------------- Patient/Caregiver Education Details Patient Name: [name] Date of Service: 01/04/2022 8:15 AM Medical Record Number: [id number] Patient Account Number: [id number] Date of Birth/Gender: 1980-11-11 (40 y.o. M) Treating RN: [name] Primary Care Physician: [name] Other Clinician: Massie Kluver Referring Physician: [name] Treating Physician/Extender: [name] Cliche in Treatment: [phone] Education Assessment Education Provided To: Patient Education Topics Provided Wound/Skin Impairment: Methods: Explain/Verbal Responses: State content correctly Electronic Signature(s) Signed: 01/04/2022 12:34:53 PM By: Worthy Keeler PA-C Entered By: Worthy Keeler on 01/04/2022 09:05:59 [name] ([id number]) -------------------------------------------------------------------------------- Wound Assessment Details Patient Name: [name] Date of Service: 01/04/2022 8:15 AM Medical Record Number: [id number] Patient Account Number: [id number] Date of Birth/Sex: August 19, 1980 (40 y.o. M) Treating RN: [name] Primary Care Oretha Weismann: [name] Other Clinician: Massie Kluver Referring  Daequan Kozma: [name] Treating Constance Whittle/Extender: [name] Cliche in Treatment: 265 Wound Status Wound Number: 1 Primary Etiology: Pyoderma Wound Location: Left, Lateral Lower Leg Wound Status: Open Wounding Event: Gradually Appeared Comorbid History: Sleep Apnea, Hypertension, Colitis Date Acquired: 11/18/2015 Weeks Of Treatment: 265 Clustered Wound: No Photos Wound Measurements Length: (cm) 1.9 Width: (cm) 1.1 Depth: (cm) 0.1 Area: (cm) 1.641 Volume: (cm) 0.164 % Reduction in Area: 66.6% % Reduction in Volume: 95.8% Epithelialization: Medium (34-66%) Wound Description Classification: Full Thickness With Exposed Support Structures Exudate Amount: Medium Exudate Type: Serosanguineous Exudate Color: red, brown Foul Odor After Cleansing: No Slough/Fibrino No Wound Bed Granulation Amount: Large (67-100%) Exposed Structure Granulation Quality: Red Fascia Exposed: No Necrotic Amount: Small (1-33%) Fat Layer (Subcutaneous Tissue) Exposed: Yes Necrotic Quality: Adherent Slough Tendon Exposed: No Muscle Exposed: No Joint Exposed: No Bone Exposed: No Treatment Notes Wound #1 (Lower Leg) Wound Laterality: Left, Lateral Cleanser Wound Cleanser Discharge Instruction: Wash your hands with soap and water. Remove old dressing, discard into plastic bag and place into trash. Cleanse the wound with Wound Cleanser prior to applying a clean dressing using gauze sponges, not tissues or cotton balls. Do not scrub or use excessive force. Pat dry using gauze sponges, not tissue or cotton balls. JEANLUC, WEGMAN ([id number]) Peri-Wound Care Desitin Maximum Strength Ointment 4 (oz) Discharge Instruction: Apply around wound edges to avoid maceration Triamcinolone Acetonide Cream, 0.1%, 15 (g) tube Discharge Instruction:  Apply to leg Topical Clobetasol Propionate ointment 0.05%, 60 (g) tube Discharge Instruction: Pt to bring to appt- applied to wound bed Primary  Dressing Apligraf Secondary Dressing (NON-BORDER) Zetuvit Plus Silicone NON-BORDER 5x5 (in/in) Secured With Compression Wrap Medichoice 4 layer Compression System, 35-40 mmHG Discharge Instruction: Apply multi-layer wrap as directed. Compression Stockings Add-Ons Electronic Signature(s) Signed: 01/04/2022 10:15:18 AM By: [name], [name], RN, [name], [name], [name] Signed: 01/04/2022 12:34:53 PM By: Worthy Keeler PA-C Entered By: Worthy Keeler on 01/04/2022 08:47:51 NOLEN, LINDAMOOD ([id number]) -------------------------------------------------------------------------------- Vitals Details Patient Name: [name] Date of Service: 01/04/2022 8:15 AM Medical Record Number: [id number] Patient Account Number: [id number] Date of Birth/Sex: 20-Dec-1980 (40 y.o. M) Treating RN: [name] Primary Care Kanai Berrios: [name] Other Clinician: Massie Kluver Referring Caasi Giglia: [name] Treating Jamason Peckham/Extender: [name] Cliche in Treatment: 265 Vital Signs Time Taken: 08:38 Temperature (F): 98.2 Height (in): 71 Pulse (bpm): 94 Weight (lbs): 338 Respiratory Rate (breaths/min): 18 Body Mass Index (BMI): 47.1 Blood Pressure (mmHg): 131/81 Reference Range: 80 - 120 mg / dl Electronic Signature(s) Signed: 01/04/2022 12:34:53 PM By: Worthy Keeler PA-C Entered By: Worthy Keeler on 01/04/2022 08:40:00

## 2022-01-07 DIAGNOSIS — E11622 Type 2 diabetes mellitus with other skin ulcer: Secondary | ICD-10-CM | POA: Diagnosis not present

## 2022-01-09 DIAGNOSIS — E11622 Type 2 diabetes mellitus with other skin ulcer: Secondary | ICD-10-CM | POA: Diagnosis not present

## 2022-01-09 NOTE — Progress Notes (Signed)
[name], [name] ([id number]) Visit Report for 01/07/2022 Physician Orders Details Patient Name: [name], [name] Date of Service: 01/07/2022 8:00 AM Medical Record Number: [id number] Patient Account Number: [id number] Date of Birth/Sex: Feb 07, 1981 (41 y.o. M) Treating RN: Carlene Coria Primary Care Provider: [name] Other Clinician: Massie Kluver Referring Provider: [name] Treating Provider/Extender: [name] Cliche in Treatment: [phone] Verbal / Phone Orders: No Diagnosis Coding Follow-up Appointments o Return Appointment in 1 week. o Nurse Visit as needed - twice a week Bathing/ Shower/ Hygiene o Clean wound with Normal Saline or wound cleanser. o May shower with wound dressing protected with water repellent cover or cast protector. o No tub bath. Cellular or Tissue Based Products Wound #1 Left,Lateral Lower Leg o Cellular or Tissue Based Product applied to wound bed; including contact layer, fixation with steri-strips, dry gauze and cover dressing. (DO NOT REMOVE). - Apligraf #7 applied Edema Control - Lymphedema / Segmental Compressive Device / Other o Optional: One layer of unna paste to top of compression wrap (to act as an anchor). - He needs this to hold it up o Elevate, Exercise Daily and Avoid Standing for Long Periods of Time. o Elevate legs to the level of the heart and pump ankles as often as possible o Elevate leg(s) parallel to the floor when sitting. Wound Treatment Wound #1 - Lower Leg Wound Laterality: Left, Lateral Cleanser: Wound Cleanser 3 x Per Week/30 Days Discharge Instructions: Wash your hands with soap and water. Remove old dressing, discard into plastic bag and place into trash. Cleanse the wound with Wound Cleanser prior to applying a clean dressing using gauze sponges, not tissues or cotton balls. Do not scrub or use excessive force. Pat dry using gauze sponges, not tissue or cotton balls. Peri-Wound Care: Desitin Maximum  Strength Ointment 4 (oz) 3 x Per Week/30 Days Discharge Instructions: Apply around wound edges to avoid maceration Peri-Wound Care: Triamcinolone Acetonide Cream, 0.1%, 15 (g) tube 3 x Per Week/30 Days Discharge Instructions: Apply to leg Topical: Clobetasol Propionate ointment 0.05%, 60 (g) tube 3 x Per Week/30 Days Discharge Instructions: Pt to bring to appt- applied to wound bed Primary Dressing: Apligraf 3 x Per Week/30 Days Secondary Dressing: (NON-BORDER) Zetuvit Plus Silicone NON-BORDER 5x5 (in/in) 3 x Per Week/30 Days Compression Wrap: Medichoice 4 layer Compression System, 35-40 mmHG 3 x Per Week/30 Days Discharge Instructions: Apply multi-layer wrap as directed. Electronic Signature(s) Signed: 01/08/2022 12:46:44 PM By: Massie Kluver Signed: 01/08/2022 6:22:21 PM By: Worthy Keeler PA-C Entered By: Massie Kluver on 01/07/2022 08:22:16 MCLEAN, MOYA ([id number]) -------------------------------------------------------------------------------- SuperBill Details Patient Name: [name] Date of Service: 01/07/2022 Medical Record Number: [id number] Patient Account Number: [id number] Date of Birth/Sex: 1981/01/07 (40 y.o. 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

## 2022-01-09 NOTE — Progress Notes (Signed)
[name], [name] ([id number]) Visit Report for 01/09/2022 Physician Orders Details Patient Name: [name], [name] Date of Service: 01/09/2022 8:00 AM Medical Record Number: [id number] Patient Account Number: [id number] Date of Birth/Sex: 12-12-1980 (41 y.o. M) Treating RN: Carlene Coria Primary Care Provider: [name] Other Clinician: Massie Kluver Referring Provider: [name] Treating Provider/Extender: Yaakov Guthrie in Treatment: 233 Verbal / Phone Orders: No Diagnosis Coding Follow-up Appointments o Return Appointment in 1 week. o Nurse Visit as needed - twice a week Bathing/ Shower/ Hygiene o Clean wound with Normal Saline or wound cleanser. o May shower with wound dressing protected with water repellent cover or cast protector. o No tub bath. Cellular or Tissue Based Products Wound #1 Left,Lateral Lower Leg o Cellular or Tissue Based Product applied to wound bed; including contact layer, fixation with steri-strips, dry gauze and cover dressing. (DO NOT REMOVE). - Apligraf #7 applied Edema Control - Lymphedema / Segmental Compressive Device / Other o Optional: One layer of unna paste to top of compression wrap (to act as an anchor). - He needs this to hold it up o Elevate, Exercise Daily and Avoid Standing for Long Periods of Time. o Elevate legs to the level of the heart and pump ankles as often as possible o Elevate leg(s) parallel to the floor when sitting. Wound Treatment Wound #1 - Lower Leg Wound Laterality: Left, Lateral Cleanser: Wound Cleanser 3 x Per Week/30 Days Discharge Instructions: Wash your hands with soap and water. Remove old dressing, discard into plastic bag and place into trash. Cleanse the wound with Wound Cleanser prior to applying a clean dressing using gauze sponges, not tissues or cotton balls. Do not scrub or use excessive force. Pat dry using gauze sponges, not tissue or cotton balls. Peri-Wound Care: Desitin  Maximum Strength Ointment 4 (oz) 3 x Per Week/30 Days Discharge Instructions: Apply around wound edges to avoid maceration Peri-Wound Care: Triamcinolone Acetonide Cream, 0.1%, 15 (g) tube 3 x Per Week/30 Days Discharge Instructions: Apply to leg Topical: Clobetasol Propionate ointment 0.05%, 60 (g) tube 3 x Per Week/30 Days Discharge Instructions: Pt to bring to appt- applied to wound bed Primary Dressing: Apligraf 3 x Per Week/30 Days Secondary Dressing: (NON-BORDER) Zetuvit Plus Silicone NON-BORDER 5x5 (in/in) 3 x Per Week/30 Days Compression Wrap: Medichoice 4 layer Compression System, 35-40 mmHG 3 x Per Week/30 Days Discharge Instructions: Apply multi-layer wrap as directed. Electronic Signature(s) Signed: 01/09/2022 8:44:22 AM By: Kalman Shan DO Signed: 01/09/2022 11:54:59 AM By: Massie Kluver Entered By: Massie Kluver on 01/09/2022 08:07:19 JABREE, REBERT ([id number]) -------------------------------------------------------------------------------- SuperBill Details Patient Name: [name] Date of Service: 01/09/2022 Medical Record Number: [id number] Patient Account Number: [id number] Date of Birth/Sex: 20-May-1981 (41 y.o. 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

## 2022-01-10 NOTE — Progress Notes (Signed)
[name], [name] ([id number]) Visit Report for 01/07/2022 Arrival Information Details Patient Name: [name], [name] Date of Service: 01/07/2022 8:00 AM Medical Record Number: [id number] Patient Account Number: [id number] Date of Birth/Sex: [birth year] (41 y.o. M) Treating RN: Carlene Coria Primary Care Yusuf Yu: [name] Other Clinician: Massie Kluver Referring Arael Piccione: [name] Treating Linden Tagliaferro/Extender: [name] Cliche in Treatment: 77 Visit Information History Since Last Visit All ordered tests and consults were completed: No Patient Arrived: Ambulatory Added or deleted any medications: No Arrival Time: 08:20 Any new allergies or adverse reactions: No Transfer Assistance: None Had a fall or experienced change in No Patient Requires Transmission-Based No activities of daily living that may affect Precautions: risk of falls: Patient Has Alerts: Yes Hospitalized since last visit: No Patient Alerts: Patient has reaction Pain Present Now: No to silver dressings. Electronic Signature(s) Signed: 01/08/2022 12:46:44 PM By: Massie Kluver Entered By: Massie Kluver on 01/07/2022 08:20:25 [name] ([id number]) -------------------------------------------------------------------------------- Clinic Level of Care Assessment Details Patient Name: [name] Date of Service: 01/07/2022 8:00 AM Medical Record Number: [id number] Patient Account Number: [id number] Date of Birth/Sex: Jun 02, 1980 (40 y.o. M) Treating RN: Carlene Coria Primary Care Khalif Stender: [name] Other Clinician: Massie Kluver Referring Jovi Zavadil: [name] Treating Asuna Peth/Extender: [name] Cliche in Treatment: 265 Clinic Level of Care Assessment Items TOOL 1 Quantity Score []  - Use when EandM and Procedure is performed on INITIAL visit 0 ASSESSMENTS - Nursing Assessment / Reassessment []  - General Physical Exam (combine w/ comprehensive assessment (listed just below) when performed  on new 0 pt. evals) []  - 0 Comprehensive Assessment (HX, ROS, Risk Assessments, Wounds Hx, etc.) ASSESSMENTS - Wound and Skin Assessment / Reassessment []  - Dermatologic / Skin Assessment (not related to wound area) 0 ASSESSMENTS - Ostomy and/or Continence Assessment and Care []  - Incontinence Assessment and Management 0 []  - 0 Ostomy Care Assessment and Management (repouching, etc.) PROCESS - Coordination of Care []  - Simple Patient / Family Education for ongoing care 0 []  - 0 Complex (extensive) Patient / Family Education for ongoing care []  - 0 Staff obtains Consents, Records, Test Results / Process Orders []  - 0 Staff telephones HHA, Nursing Homes / Clarify orders / etc []  - 0 Routine Transfer to another Facility (non-emergent condition) []  - 0 Routine Hospital Admission (non-emergent condition) []  - 0 New Admissions / Biomedical engineer / Ordering NPWT, Apligraf, etc. []  - 0 Emergency Hospital Admission (emergent condition) PROCESS - Special Needs []  - Pediatric / Minor Patient Management 0 []  - 0 Isolation Patient Management []  - 0 Hearing / Language / Visual special needs []  - 0 Assessment of Community assistance (transportation, D/C planning, etc.) []  - 0 Additional assistance / Altered mentation []  - 0 Support Surface(s) Assessment (bed, cushion, seat, etc.) INTERVENTIONS - Miscellaneous []  - External ear exam 0 []  - 0 Patient Transfer (multiple staff / Civil Service fast streamer / Similar devices) []  - 0 Simple Staple / Suture removal (25 or less) []  - 0 Complex Staple / Suture removal (26 or more) []  - 0 Hypo/Hyperglycemic Management (do not check if billed separately) []  - 0 Ankle / Brachial Index (ABI) - do not check if billed separately Has the patient been seen at the hospital within the last three years: Yes Total Score: 0 Level Of Care: ____ [name] ([id number]) Electronic Signature(s) Signed: 01/08/2022 12:46:44 PM By: Massie Kluver Entered By:  Massie Kluver on 01/07/2022 08:22:50 Cieslik, [name] ([id number]) -------------------------------------------------------------------------------- Compression Therapy Details Patient Name: [name] Date of Service: 01/07/2022 8:00 AM Medical  Record Number: [id number] Patient Account Number: [id number] Date of Birth/Sex: [birth year] (40 y.o. M) Treating RN: Carlene Coria Primary Care Syrai Gladwin: [name] Other Clinician: Massie Kluver Referring Leidy Massar: [name] Treating Demaurion Dicioccio/Extender: [name] Cliche in Treatment: 265 Compression Therapy Performed for Wound Assessment: Wound #1 Left,Lateral Lower Leg Performed By: Lenice Pressman, Angie, Compression Type: Three Layer Pre Treatment ABI: 1.2 Electronic Signature(s) Signed: 01/08/2022 12:46:44 PM By: Massie Kluver Entered By: Massie Kluver on 01/07/2022 08:21:56 Lightsey, [name] ([id number]) -------------------------------------------------------------------------------- Encounter Discharge Information Details Patient Name: [name] Date of Service: 01/07/2022 8:00 AM Medical Record Number: [id number] Patient Account Number: [id number] Date of Birth/Sex: 1980/05/30 (40 y.o. M) Treating RN: Carlene Coria Primary Care Kellan Raffield: [name] Other Clinician: Massie Kluver Referring Tenae Graziosi: [name] Treating Bethany Hirt/Extender: [name] Cliche in Treatment: 265 Encounter Discharge Information Items Discharge Condition: Stable Ambulatory Status: Ambulatory Discharge Destination: Home Transportation: Private Auto Accompanied By: self Schedule Follow-up Appointment: Yes Clinical Summary of Care: Electronic Signature(s) Signed: 01/08/2022 12:46:44 PM By: Massie Kluver Entered By: Massie Kluver on 01/07/2022 08:22:42 [name] ([id number]) -------------------------------------------------------------------------------- Wound Assessment Details Patient Name: [name] Date of Service:  01/07/2022 8:00 AM Medical Record Number: [id number] Patient Account Number: [id number] Date of Birth/Sex: 03/25/1981 (40 y.o. M) Treating RN: Carlene Coria Primary Care Chlora Mcbain: [name] Other Clinician: Massie Kluver Referring Carliss Porcaro: [name] Treating Mattew Chriswell/Extender: [name] Cliche in Treatment: 265 Wound Status Wound Number: 1 Primary Etiology: Pyoderma Wound Location: Left, Lateral Lower Leg Wound Status: Open Wounding Event: Gradually Appeared Comorbid History: Sleep Apnea, Hypertension, Colitis Date Acquired: 11/18/2015 Weeks Of Treatment: 265 Clustered Wound: No Wound Measurements Length: (cm) 1.9 Width: (cm) 1.9 Depth: (cm) 0.1 Area: (cm) 2.835 Volume: (cm) 0.284 % Reduction in Area: 42.2% % Reduction in Volume: 92.8% Epithelialization: Medium (34-66%) Wound Description Classification: Full Thickness With Exposed Support Structures Exudate Amount: Medium Exudate Type: Serosanguineous Exudate Color: red, brown Foul Odor After Cleansing: No Slough/Fibrino No Wound Bed Granulation Amount: Large (67-100%) Exposed Structure Granulation Quality: Red Fascia Exposed: No Necrotic Amount: Small (1-33%) Fat Layer (Subcutaneous Tissue) Exposed: Yes Necrotic Quality: Adherent Slough Tendon Exposed: No Muscle Exposed: No Joint Exposed: No Bone Exposed: No Electronic Signature(s) Signed: 01/08/2022 12:46:44 PM By: Massie Kluver Signed: 01/10/2022 4:19:23 PM By: Carlene Coria RN Entered By: Massie Kluver on 01/07/2022 08:21:11

## 2022-01-10 NOTE — Progress Notes (Signed)
[name], [name] ([id number]) Visit Report for 01/09/2022 Arrival Information Details Patient Name: [name], [name] Date of Service: 01/09/2022 8:00 AM Medical Record Number: [id number] Patient Account Number: [id number] Date of Birth/Sex: May 04, 1981 (41 y.o. M) Treating RN: Carlene Coria Primary Care Bobbi Yount: [name] Other Clinician: Massie Kluver Referring Anahita Cua: [name] Treating Lewi Drost/Extender: Yaakov Guthrie in Treatment: 266 Visit Information History Since Last Visit All ordered tests and consults were completed: No Patient Arrived: Ambulatory Added or deleted any medications: No Arrival Time: 08:05 Any new allergies or adverse reactions: No Transfer Assistance: None Had a fall or experienced change in No Patient Requires Transmission-Based No activities of daily living that may affect Precautions: risk of falls: Patient Has Alerts: Yes Hospitalized since last visit: No Patient Alerts: Patient has reaction Pain Present Now: No to silver dressings. Electronic Signature(s) Signed: 01/09/2022 11:54:59 AM By: Massie Kluver Entered By: Massie Kluver on 01/09/2022 08:40:41 [name] ([id number]) -------------------------------------------------------------------------------- Clinic Level of Care Assessment Details Patient Name: [name], [name] Date of Service: 01/09/2022 8:00 AM Medical Record Number: [id number] Patient Account Number: [id number] Date of Birth/Sex: [birth year] (40 y.o. M) Treating RN: Carlene Coria Primary Care Dylen Mcelhannon: [name] Other Clinician: Massie Kluver Referring Raileigh Sabater: [name] Treating Ruth Tully/Extender: Yaakov Guthrie in Treatment: 266 Clinic Level of Care Assessment Items TOOL 1 Quantity Score []  - Use when EandM and Procedure is performed on INITIAL visit 0 ASSESSMENTS - Nursing Assessment / Reassessment []  - General Physical Exam (combine w/ comprehensive assessment (listed just below) when  performed on new 0 pt. evals) []  - 0 Comprehensive Assessment (HX, ROS, Risk Assessments, Wounds Hx, etc.) ASSESSMENTS - Wound and Skin Assessment / Reassessment []  - Dermatologic / Skin Assessment (not related to wound area) 0 ASSESSMENTS - Ostomy and/or Continence Assessment and Care []  - Incontinence Assessment and Management 0 []  - 0 Ostomy Care Assessment and Management (repouching, etc.) PROCESS - Coordination of Care []  - Simple Patient / Family Education for ongoing care 0 []  - 0 Complex (extensive) Patient / Family Education for ongoing care []  - 0 Staff obtains Programmer, systems, Records, Test Results / Process Orders []  - 0 Staff telephones HHA, Nursing Homes / Clarify orders / etc []  - 0 Routine Transfer to another Facility (non-emergent condition) []  - 0 Routine Hospital Admission (non-emergent condition) []  - 0 New Admissions / Biomedical engineer / Ordering NPWT, Apligraf, etc. []  - 0 Emergency Hospital Admission (emergent condition) PROCESS - Special Needs []  - Pediatric / Minor Patient Management 0 []  - 0 Isolation Patient Management []  - 0 Hearing / Language / Visual special needs []  - 0 Assessment of Community assistance (transportation, D/C planning, etc.) []  - 0 Additional assistance / Altered mentation []  - 0 Support Surface(s) Assessment (bed, cushion, seat, etc.) INTERVENTIONS - Miscellaneous []  - External ear exam 0 []  - 0 Patient Transfer (multiple staff / Civil Service fast streamer / Similar devices) []  - 0 Simple Staple / Suture removal (25 or less) []  - 0 Complex Staple / Suture removal (26 or more) []  - 0 Hypo/Hyperglycemic Management (do not check if billed separately) []  - 0 Ankle / Brachial Index (ABI) - do not check if billed separately Has the patient been seen at the hospital within the last three years: Yes Total Score: 0 Level Of Care: ____ [name] ([id number]) Electronic Signature(s) Signed: 01/09/2022 11:54:59 AM By: Massie Kluver Entered By: Massie Kluver on 01/09/2022 08:41:37 Sigler, [name] ([id number]) -------------------------------------------------------------------------------- Compression Therapy Details Patient Name: [name] Date of Service: 01/09/2022 8:00 AM Medical  Record Number: [id number] Patient Account Number: [id number] Date of Birth/Sex: May 31, 1980 (40 y.o. M) Treating RN: Carlene Coria Primary Care Nicollette Wilhelmi: [name] Other Clinician: Massie Kluver Referring Caridad Silveira: [name] Treating Kalman Nylen/Extender: Yaakov Guthrie in Treatment: 266 Compression Therapy Performed for Wound Assessment: Wound #1 Left,Lateral Lower Leg Performed By: Lenice Pressman, Angie, Compression Type: Four Layer Pre Treatment ABI: 1.2 Electronic Signature(s) Signed: 01/09/2022 11:54:59 AM By: Massie Kluver Entered By: Massie Kluver on 01/09/2022 08:40:53 Mawson, [name] ([id number]) -------------------------------------------------------------------------------- Encounter Discharge Information Details Patient Name: [name] Date of Service: 01/09/2022 8:00 AM Medical Record Number: [id number] Patient Account Number: [id number] Date of Birth/Sex: 09/11/1980 (40 y.o. M) Treating RN: Carlene Coria Primary Care Reve Crocket: [name] Other Clinician: Massie Kluver Referring Keahi Mccarney: [name] Treating Allecia Bells/Extender: Yaakov Guthrie in Treatment: 266 Encounter Discharge Information Items Discharge Condition: Stable Ambulatory Status: Ambulatory Discharge Destination: Home Transportation: Private Auto Accompanied By: self Schedule Follow-up Appointment: Yes Clinical Summary of Care: Electronic Signature(s) Signed: 01/09/2022 11:54:59 AM By: Massie Kluver Entered By: Massie Kluver on 01/09/2022 08:41:25 Buitron, [name] ([id number]) -------------------------------------------------------------------------------- Wound Assessment Details Patient Name: [name] Date of Service: 01/09/2022 8:00 AM Medical Record Number: [id number] Patient Account Number: [id number] Date of Birth/Sex: [birth year] (40 y.o. M) Treating RN: Carlene Coria Primary Care Schae Cando: [name] Other Clinician: Massie Kluver Referring Rollyn Scialdone: [name] Treating Kathlene Yano/Extender: Yaakov Guthrie in Treatment: 266 Wound Status Wound Number: 1 Primary Etiology: Pyoderma Wound Location: Left, Lateral Lower Leg Wound Status: Open Wounding Event: Gradually Appeared Comorbid History: Sleep Apnea, Hypertension, Colitis Date Acquired: 11/18/2015 Weeks Of Treatment: 266 Clustered Wound: No Wound Measurements Length: (cm) 1.9 Width: (cm) 1.9 Depth: (cm) 0.1 Area: (cm) 2.835 Volume: (cm) 0.284 % Reduction in Area: 42.2% % Reduction in Volume: 92.8% Epithelialization: Medium (34-66%) Wound Description Classification: Full Thickness With Exposed Support Structures Exudate Amount: Medium Exudate Type: Serosanguineous Exudate Color: red, brown Foul Odor After Cleansing: No Slough/Fibrino No Wound Bed Granulation Amount: Large (67-100%) Exposed Structure Granulation Quality: Red Fascia Exposed: No Necrotic Amount: Small (1-33%) Fat Layer (Subcutaneous Tissue) Exposed: Yes Necrotic Quality: Adherent Slough Tendon Exposed: No Muscle Exposed: No Joint Exposed: No Bone Exposed: No Treatment Notes Wound #1 (Lower Leg) Wound Laterality: Left, Lateral Cleanser Wound Cleanser Discharge Instruction: Wash your hands with soap and water. Remove old dressing, discard into plastic bag and place into trash. Cleanse the wound with Wound Cleanser prior to applying a clean dressing using gauze sponges, not tissues or cotton balls. Do not scrub or use excessive force. Pat dry using gauze sponges, not tissue or cotton balls. Peri-Wound Care Desitin Maximum Strength Ointment 4 (oz) Discharge Instruction: Apply around wound edges to avoid  maceration Triamcinolone Acetonide Cream, 0.1%, 15 (g) tube Discharge Instruction: Apply to leg Topical Clobetasol Propionate ointment 0.05%, 60 (g) tube Discharge Instruction: Pt to bring to appt- applied to wound bed Primary Dressing Apligraf Secondary Dressing (NON-BORDER) Zetuvit Plus Silicone NON-BORDER 5x5 (in/in) Almendarez, Raygen ([id number]) Secured With Compression Wrap Medichoice 4 layer Compression System, 35-40 mmHG Discharge Instruction: Apply multi-layer wrap as directed. Compression Stockings Add-Ons Electronic Signature(s) Signed: 01/09/2022 11:54:59 AM By: Massie Kluver Signed: 01/10/2022 4:19:23 PM By: Carlene Coria RN Entered By: Massie Kluver on 01/09/2022 08:06:27

## 2022-01-11 DIAGNOSIS — E11622 Type 2 diabetes mellitus with other skin ulcer: Secondary | ICD-10-CM | POA: Diagnosis not present

## 2022-01-14 DIAGNOSIS — E11622 Type 2 diabetes mellitus with other skin ulcer: Secondary | ICD-10-CM | POA: Diagnosis not present

## 2022-01-14 NOTE — Progress Notes (Signed)
[name], [name] ([id number]) Visit Report for 01/14/2022 Arrival Information Details Patient Name: [name], [name] Date of Service: 01/14/2022 8:00 AM Medical Record Number: [id number] Patient Account Number: [id number] Date of Birth/Sex: [birth year] (41 y.o. M) Treating RN: Carlene Coria Primary Care Dudley Mages: [name] Other Clinician: Massie Kluver Referring Seynabou Fults: [name] Treating Chaim Gatley/Extender: [name] Cliche in Treatment: 266 Visit Information History Since Last Visit All ordered tests and consults were completed: No Patient Arrived: Ambulatory Added or deleted any medications: No Arrival Time: 08:03 Any new allergies or adverse reactions: No Transfer Assistance: None Had a fall or experienced change in No Patient Requires Transmission-Based No activities of daily living that may affect Precautions: risk of falls: Patient Has Alerts: Yes Hospitalized since last visit: No Patient Alerts: Patient has reaction Pain Present Now: No to silver dressings. Electronic Signature(s) Signed: 01/14/2022 5:10:30 PM By: Massie Kluver Entered By: Massie Kluver on 01/14/2022 08:03:19 [name] ([id number]) -------------------------------------------------------------------------------- Clinic Level of Care Assessment Details Patient Name: [name], [name] Date of Service: 01/14/2022 8:00 AM Medical Record Number: [id number] Patient Account Number: [id number] Date of Birth/Sex: January 05, 1981 (40 y.o. M) Treating RN: Carlene Coria Primary Care Amala Petion: [name] Other Clinician: Massie Kluver Referring Zackry Deines: [name] Treating Sabastian Raimondi/Extender: [name] Cliche in Treatment: 266 Clinic Level of Care Assessment Items TOOL 1 Quantity Score []  - Use when EandM and Procedure is performed on INITIAL visit 0 ASSESSMENTS - Nursing Assessment / Reassessment []  - General Physical Exam (combine w/ comprehensive assessment (listed just below) when performed on  new 0 pt. evals) []  - 0 Comprehensive Assessment (HX, ROS, Risk Assessments, Wounds Hx, etc.) ASSESSMENTS - Wound and Skin Assessment / Reassessment []  - Dermatologic / Skin Assessment (not related to wound area) 0 ASSESSMENTS - Ostomy and/or Continence Assessment and Care []  - Incontinence Assessment and Management 0 []  - 0 Ostomy Care Assessment and Management (repouching, etc.) PROCESS - Coordination of Care []  - Simple Patient / Family Education for ongoing care 0 []  - 0 Complex (extensive) Patient / Family Education for ongoing care []  - 0 Staff obtains Programmer, systems, Records, Test Results / Process Orders []  - 0 Staff telephones HHA, Nursing Homes / Clarify orders / etc []  - 0 Routine Transfer to another Facility (non-emergent condition) []  - 0 Routine Hospital Admission (non-emergent condition) []  - 0 New Admissions / Biomedical engineer / Ordering NPWT, Apligraf, etc. []  - 0 Emergency Hospital Admission (emergent condition) PROCESS - Special Needs []  - Pediatric / Minor Patient Management 0 []  - 0 Isolation Patient Management []  - 0 Hearing / Language / Visual special needs []  - 0 Assessment of Community assistance (transportation, D/C planning, etc.) []  - 0 Additional assistance / Altered mentation []  - 0 Support Surface(s) Assessment (bed, cushion, seat, etc.) INTERVENTIONS - Miscellaneous []  - External ear exam 0 []  - 0 Patient Transfer (multiple staff / Civil Service fast streamer / Similar devices) []  - 0 Simple Staple / Suture removal (25 or less) []  - 0 Complex Staple / Suture removal (26 or more) []  - 0 Hypo/Hyperglycemic Management (do not check if billed separately) []  - 0 Ankle / Brachial Index (ABI) - do not check if billed separately Has the patient been seen at the hospital within the last three years: Yes Total Score: 0 Level Of Care: ____ [name] ([id number]) Electronic Signature(s) Signed: 01/14/2022 5:10:30 PM By: Massie Kluver Entered By:  Massie Kluver on 01/14/2022 08:21:56 [name], [name] ([id number]) -------------------------------------------------------------------------------- Compression Therapy Details Patient Name: [name] Date of Service: 01/14/2022 8:00 AM Medical  Record Number: [id number] Patient Account Number: [id number] Date of Birth/Sex: 1980-10-23 (40 y.o. M) Treating RN: Carlene Coria Primary Care Alfrieda Tarry: [name] Other Clinician: Massie Kluver Referring Moise Friday: [name] Treating Giulliana Mcroberts/Extender: Jeri Cos Weeks in Treatment: 266 Compression Therapy Performed for Wound Assessment: Wound #1 Left,Lateral Lower Leg Performed By: Lenice Pressman, Angie, Compression Type: Four Layer Pre Treatment ABI: 1.2 Electronic Signature(s) Signed: 01/14/2022 5:10:30 PM By: Massie Kluver Entered By: Massie Kluver on 01/14/2022 08:04:05 [name] ([id number]) -------------------------------------------------------------------------------- Encounter Discharge Information Details Patient Name: [name] Date of Service: 01/14/2022 8:00 AM Medical Record Number: [id number] Patient Account Number: [id number] Date of Birth/Sex: 11-Sep-1980 (40 y.o. M) Treating RN: Carlene Coria Primary Care King Pinzon: [name] Other Clinician: Massie Kluver Referring Jenaya Saar: [name] Treating Juston Goheen/Extender: [name] Cliche in Treatment: 266 Encounter Discharge Information Items Discharge Condition: Stable Ambulatory Status: Ambulatory Discharge Destination: Home Transportation: Private Auto Accompanied By: self Schedule Follow-up Appointment: Yes Clinical Summary of Care: Electronic Signature(s) Signed: 01/14/2022 5:10:30 PM By: Massie Kluver Entered By: Massie Kluver on 01/14/2022 08:21:50 [name], [name] ([id number]) -------------------------------------------------------------------------------- Wound Assessment Details Patient Name: [name] Date of Service:  01/14/2022 8:00 AM Medical Record Number: [id number] Patient Account Number: [id number] Date of Birth/Sex: 03-13-1981 (40 y.o. M) Treating RN: Carlene Coria Primary Care Arlynn Stare: [name] Other Clinician: Massie Kluver Referring Shae Augello: [name] Treating Debbora Ang/Extender: [name] Cliche in Treatment: 266 Wound Status Wound Number: 1 Primary Etiology: Pyoderma Wound Location: Left, Lateral Lower Leg Wound Status: Open Wounding Event: Gradually Appeared Comorbid History: Sleep Apnea, Hypertension, Colitis Date Acquired: 11/18/2015 Weeks Of Treatment: 266 Clustered Wound: No Wound Measurements Length: (cm) 1.9 Width: (cm) 1.9 Depth: (cm) 0.1 Area: (cm) 2.835 Volume: (cm) 0.284 % Reduction in Area: 42.2% % Reduction in Volume: 92.8% Epithelialization: Medium (34-66%) Wound Description Classification: Full Thickness With Exposed Support Structures Exudate Amount: Medium Exudate Type: Serosanguineous Exudate Color: red, brown Foul Odor After Cleansing: No Slough/Fibrino No Wound Bed Granulation Amount: Large (67-100%) Exposed Structure Granulation Quality: Red Fascia Exposed: No Necrotic Amount: Small (1-33%) Fat Layer (Subcutaneous Tissue) Exposed: Yes Necrotic Quality: Adherent Slough Tendon Exposed: No Muscle Exposed: No Joint Exposed: No Bone Exposed: No Treatment Notes Wound #1 (Lower Leg) Wound Laterality: Left, Lateral Cleanser Wound Cleanser Discharge Instruction: Wash your hands with soap and water. Remove old dressing, discard into plastic bag and place into trash. Cleanse the wound with Wound Cleanser prior to applying a clean dressing using gauze sponges, not tissues or cotton balls. Do not scrub or use excessive force. Pat dry using gauze sponges, not tissue or cotton balls. Peri-Wound Care Desitin Maximum Strength Ointment 4 (oz) Discharge Instruction: Apply around wound edges to avoid maceration Triamcinolone Acetonide Cream, 0.1%,  15 (g) tube Discharge Instruction: Apply to leg Topical Clobetasol Propionate ointment 0.05%, 60 (g) tube Discharge Instruction: Pt to bring to appt- applied to wound bed Primary Dressing Apligraf Secondary Dressing (NON-BORDER) Zetuvit Plus Silicone NON-BORDER 5x5 (in/in) [place], [place] ([id number]) Secured With Compression Wrap Medichoice 4 layer Compression System, 35-40 mmHG Discharge Instruction: Apply multi-layer wrap as directed. Compression Stockings Add-Ons Electronic Signature(s) Signed: 01/14/2022 4:28:34 PM By: Carlene Coria RN Signed: 01/14/2022 5:10:30 PM By: Massie Kluver Entered By: Massie Kluver on 01/14/2022 08:03:39

## 2022-01-14 NOTE — Progress Notes (Signed)
[name], [name] ([id number]) Visit Report for 01/11/2022 Physician Orders Details Patient Name: [name], [name] Date of Service: 01/11/2022 8:15 AM Medical Record Number: [id number] Patient Account Number: [id number] Date of Birth/Sex: [birth year] (41 y.o. M) Treating RN: Carlene Coria Primary Care Provider: [name] Other Clinician: Massie Kluver Referring Provider: [name] Treating Provider/Extender: [name] Cliche in Treatment: [phone] Verbal / Phone Orders: No Diagnosis Coding Follow-up Appointments o Return Appointment in 1 week. o Nurse Visit as needed - twice a week Bathing/ Shower/ Hygiene o Clean wound with Normal Saline or wound cleanser. o May shower with wound dressing protected with water repellent cover or cast protector. o No tub bath. Cellular or Tissue Based Products Wound #1 Left,Lateral Lower Leg o Cellular or Tissue Based Product applied to wound bed; including contact layer, fixation with steri-strips, dry gauze and cover dressing. (DO NOT REMOVE). - Apligraf #7 applied Edema Control - Lymphedema / Segmental Compressive Device / Other o Optional: One layer of unna paste to top of compression wrap (to act as an anchor). - He needs this to hold it up o Elevate, Exercise Daily and Avoid Standing for Long Periods of Time. o Elevate legs to the level of the heart and pump ankles as often as possible o Elevate leg(s) parallel to the floor when sitting. Wound Treatment Wound #1 - Lower Leg Wound Laterality: Left, Lateral Cleanser: Wound Cleanser 3 x Per Week/30 Days Discharge Instructions: Wash your hands with soap and water. Remove old dressing, discard into plastic bag and place into trash. Cleanse the wound with Wound Cleanser prior to applying a clean dressing using gauze sponges, not tissues or cotton balls. Do not scrub or use excessive force. Pat dry using gauze sponges, not tissue or cotton balls. Peri-Wound Care: Desitin Maximum  Strength Ointment 4 (oz) 3 x Per Week/30 Days Discharge Instructions: Apply around wound edges to avoid maceration Peri-Wound Care: Triamcinolone Acetonide Cream, 0.1%, 15 (g) tube 3 x Per Week/30 Days Discharge Instructions: Apply to leg Topical: Clobetasol Propionate ointment 0.05%, 60 (g) tube 3 x Per Week/30 Days Discharge Instructions: Pt to bring to appt- applied to wound bed Primary Dressing: Apligraf 3 x Per Week/30 Days Secondary Dressing: (NON-BORDER) Zetuvit Plus Silicone NON-BORDER 5x5 (in/in) 3 x Per Week/30 Days Compression Wrap: Medichoice 4 layer Compression System, 35-40 mmHG 3 x Per Week/30 Days Discharge Instructions: Apply multi-layer wrap as directed. Electronic Signature(s) Signed: 01/11/2022 12:36:15 PM By: Worthy Keeler PA-C Signed: 01/14/2022 5:10:30 PM By: Massie Kluver Entered By: Massie Kluver on 01/11/2022 08:07:11 [name], [name] ([id number]) -------------------------------------------------------------------------------- SuperBill Details Patient Name: [name] Date of Service: 01/11/2022 Medical Record Number: [id number] Patient Account Number: [id number] Date of Birth/Sex: 07-01-1980 (40 y.o. 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

## 2022-01-14 NOTE — Progress Notes (Signed)
DUANE, EARNSHAW ([id number]) Visit Report for 01/11/2022 Arrival Information Details Patient Name: [name], [name] Date of Service: 01/11/2022 8:15 AM Medical Record Number: [id number] Patient Account Number: [id number] Date of Birth/Sex: 11/14/1980 (41 y.o. M) Treating RN: Carlene Coria Primary Care Daman Steffenhagen: [name] Other Clinician: Massie Kluver Referring Urho Rio: [name] Treating Georgeanne Frankland/Extender: [name] Cliche in Treatment: 266 Visit Information History Since Last Visit All ordered tests and consults were completed: No Patient Arrived: Ambulatory Added or deleted any medications: No Arrival Time: 08:05 Any new allergies or adverse reactions: No Transfer Assistance: None Had a fall or experienced change in No Patient Requires Transmission-Based No activities of daily living that may affect Precautions: risk of falls: Patient Has Alerts: Yes Hospitalized since last visit: No Patient Alerts: Patient has reaction Pain Present Now: No to silver dressings. Electronic Signature(s) Signed: 01/14/2022 5:10:30 PM By: Massie Kluver Entered By: Massie Kluver on 01/11/2022 08:06:04 [name] ([id number]) -------------------------------------------------------------------------------- Clinic Level of Care Assessment Details Patient Name: [name], [name] Date of Service: 01/11/2022 8:15 AM Medical Record Number: [id number] Patient Account Number: [id number] Date of Birth/Sex: 1981-05-06 (41 y.o. M) Treating RN: Carlene Coria Primary Care Sunday Klos: [name] Other Clinician: Massie Kluver Referring Laverda Stribling: [name] Treating Taveon Enyeart/Extender: [name] Cliche in Treatment: 266 Clinic Level of Care Assessment Items TOOL 1 Quantity Score []  - Use when EandM and Procedure is performed on INITIAL visit 0 ASSESSMENTS - Nursing Assessment / Reassessment []  - General Physical Exam (combine w/ comprehensive assessment (listed just below) when performed on  new 0 pt. evals) []  - 0 Comprehensive Assessment (HX, ROS, Risk Assessments, Wounds Hx, etc.) ASSESSMENTS - Wound and Skin Assessment / Reassessment []  - Dermatologic / Skin Assessment (not related to wound area) 0 ASSESSMENTS - Ostomy and/or Continence Assessment and Care []  - Incontinence Assessment and Management 0 []  - 0 Ostomy Care Assessment and Management (repouching, etc.) PROCESS - Coordination of Care []  - Simple Patient / Family Education for ongoing care 0 []  - 0 Complex (extensive) Patient / Family Education for ongoing care []  - 0 Staff obtains Programmer, systems, Records, Test Results / Process Orders []  - 0 Staff telephones HHA, Nursing Homes / Clarify orders / etc []  - 0 Routine Transfer to another Facility (non-emergent condition) []  - 0 Routine Hospital Admission (non-emergent condition) []  - 0 New Admissions / Biomedical engineer / Ordering NPWT, Apligraf, etc. []  - 0 Emergency Hospital Admission (emergent condition) PROCESS - Special Needs []  - Pediatric / Minor Patient Management 0 []  - 0 Isolation Patient Management []  - 0 Hearing / Language / Visual special needs []  - 0 Assessment of Community assistance (transportation, D/C planning, etc.) []  - 0 Additional assistance / Altered mentation []  - 0 Support Surface(s) Assessment (bed, cushion, seat, etc.) INTERVENTIONS - Miscellaneous []  - External ear exam 0 []  - 0 Patient Transfer (multiple staff / Civil Service fast streamer / Similar devices) []  - 0 Simple Staple / Suture removal (25 or less) []  - 0 Complex Staple / Suture removal (26 or more) []  - 0 Hypo/Hyperglycemic Management (do not check if billed separately) []  - 0 Ankle / Brachial Index (ABI) - do not check if billed separately Has the patient been seen at the hospital within the last three years: Yes Total Score: 0 Level Of Care: ____ [name] ([id number]) Electronic Signature(s) Signed: 01/14/2022 5:10:30 PM By: Massie Kluver Entered By:  Massie Kluver on 01/11/2022 08:34:32 [place], [name] ([id number]) -------------------------------------------------------------------------------- Compression Therapy Details Patient Name: [name] Date of Service: 01/11/2022 8:15 AM Medical  Record Number: [id number] Patient Account Number: [id number] Date of Birth/Sex: February 13, 1981 (40 y.o. M) Treating RN: Carlene Coria Primary Care Chanda Laperle: [name] Other Clinician: Massie Kluver Referring Mical Kicklighter: [name] Treating Melisa Donofrio/Extender: Jeri Cos Weeks in Treatment: 266 Compression Therapy Performed for Wound Assessment: Wound #1 Left,Lateral Lower Leg Performed By: Lenice Pressman, Angie, Compression Type: Four Layer Pre Treatment ABI: 1.2 Electronic Signature(s) Signed: 01/14/2022 5:10:30 PM By: Massie Kluver Entered By: Massie Kluver on 01/11/2022 08:06:52 [place], [name] ([id number]) -------------------------------------------------------------------------------- Encounter Discharge Information Details Patient Name: [name] Date of Service: 01/11/2022 8:15 AM Medical Record Number: [id number] Patient Account Number: [id number] Date of Birth/Sex: 1981-04-28 (40 y.o. M) Treating RN: Carlene Coria Primary Care Keilynn Marano: [name] Other Clinician: Massie Kluver Referring Muaad Boehning: [name] Treating Jovahn Breit/Extender: [name] Cliche in Treatment: 266 Encounter Discharge Information Items Discharge Condition: Stable Ambulatory Status: Ambulatory Discharge Destination: Home Transportation: Private Auto Accompanied By: self Schedule Follow-up Appointment: Yes Clinical Summary of Care: Electronic Signature(s) Signed: 01/14/2022 5:10:30 PM By: Massie Kluver Entered By: Massie Kluver on 01/11/2022 08:34:25 Dilone, [name] ([id number]) -------------------------------------------------------------------------------- Wound Assessment Details Patient Name: [name] Date of Service:  01/11/2022 8:15 AM Medical Record Number: [id number] Patient Account Number: [id number] Date of Birth/Sex: [birth year] (40 y.o. M) Treating RN: Carlene Coria Primary Care Ayanah Snader: [name] Other Clinician: Massie Kluver Referring Ryiah Bellissimo: [name] Treating Shauntay Brunelli/Extender: [name] Cliche in Treatment: 266 Wound Status Wound Number: 1 Primary Etiology: Pyoderma Wound Location: Left, Lateral Lower Leg Wound Status: Open Wounding Event: Gradually Appeared Comorbid History: Sleep Apnea, Hypertension, Colitis Date Acquired: 11/18/2015 Weeks Of Treatment: 266 Clustered Wound: No Wound Measurements Length: (cm) 1.9 Width: (cm) 1.9 Depth: (cm) 0.1 Area: (cm) 2.835 Volume: (cm) 0.284 % Reduction in Area: 42.2% % Reduction in Volume: 92.8% Epithelialization: Medium (34-66%) Wound Description Classification: Full Thickness With Exposed Support Structures Exudate Amount: Medium Exudate Type: Serosanguineous Exudate Color: red, brown Foul Odor After Cleansing: No Slough/Fibrino No Wound Bed Granulation Amount: Large (67-100%) Exposed Structure Granulation Quality: Red Fascia Exposed: No Necrotic Amount: Small (1-33%) Fat Layer (Subcutaneous Tissue) Exposed: Yes Necrotic Quality: Adherent Slough Tendon Exposed: No Muscle Exposed: No Joint Exposed: No Bone Exposed: No Electronic Signature(s) Signed: 01/14/2022 4:28:34 PM By: Carlene Coria RN Signed: 01/14/2022 5:10:30 PM By: Massie Kluver Entered By: Massie Kluver on 01/11/2022 36:64:40

## 2022-01-14 NOTE — Progress Notes (Signed)
[name], [name] ([id number]) Visit Report for 01/14/2022 Physician Orders Details Patient Name: [name], [name] Date of Service: 01/14/2022 8:00 AM Medical Record Number: [id number] Patient Account Number: [id number] Date of Birth/Sex: 1980/12/30 (41 y.o. M) Treating RN: Carlene Coria Primary Care Provider: [name] Other Clinician: Massie Kluver Referring Provider: [name] Treating Provider/Extender: [name] Cliche in Treatment: [phone] Verbal / Phone Orders: No Diagnosis Coding Follow-up Appointments o Return Appointment in 1 week. o Nurse Visit as needed - twice a week Bathing/ Shower/ Hygiene o Clean wound with Normal Saline or wound cleanser. o May shower with wound dressing protected with water repellent cover or cast protector. o No tub bath. Cellular or Tissue Based Products Wound #1 Left,Lateral Lower Leg o Cellular or Tissue Based Product applied to wound bed; including contact layer, fixation with steri-strips, dry gauze and cover dressing. (DO NOT REMOVE). - Apligraf #7 applied Edema Control - Lymphedema / Segmental Compressive Device / Other o Optional: One layer of unna paste to top of compression wrap (to act as an anchor). - He needs this to hold it up o Elevate, Exercise Daily and Avoid Standing for Long Periods of Time. o Elevate legs to the level of the heart and pump ankles as often as possible o Elevate leg(s) parallel to the floor when sitting. Wound Treatment Wound #1 - Lower Leg Wound Laterality: Left, Lateral Cleanser: Wound Cleanser 3 x Per Week/30 Days Discharge Instructions: Wash your hands with soap and water. Remove old dressing, discard into plastic bag and place into trash. Cleanse the wound with Wound Cleanser prior to applying a clean dressing using gauze sponges, not tissues or cotton balls. Do not scrub or use excessive force. Pat dry using gauze sponges, not tissue or cotton balls. Peri-Wound Care: Desitin Maximum  Strength Ointment 4 (oz) 3 x Per Week/30 Days Discharge Instructions: Apply around wound edges to avoid maceration Peri-Wound Care: Triamcinolone Acetonide Cream, 0.1%, 15 (g) tube 3 x Per Week/30 Days Discharge Instructions: Apply to leg Topical: Clobetasol Propionate ointment 0.05%, 60 (g) tube 3 x Per Week/30 Days Discharge Instructions: Pt to bring to appt- applied to wound bed Primary Dressing: Apligraf 3 x Per Week/30 Days Secondary Dressing: (NON-BORDER) Zetuvit Plus Silicone NON-BORDER 5x5 (in/in) 3 x Per Week/30 Days Compression Wrap: Medichoice 4 layer Compression System, 35-40 mmHG 3 x Per Week/30 Days Discharge Instructions: Apply multi-layer wrap as directed. Electronic Signature(s) Signed: 01/14/2022 5:10:30 PM By: Massie Kluver Signed: 01/14/2022 5:17:38 PM By: Worthy Keeler PA-C Entered By: Massie Kluver on 01/14/2022 08:04:23 [name], [name] ([id number]) -------------------------------------------------------------------------------- SuperBill Details Patient Name: [name] Date of Service: 01/14/2022 Medical Record Number: [id number] Patient Account Number: [id number] Date of Birth/Sex: [birth year] (40 y.o. 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

## 2022-01-15 NOTE — Unmapped (Signed)
Called patient to discuss. Order written for every 6 weeks remicade as in media tab. Suspect that scheduling just got off with the order renewal. Asked him to call and push out the infusion and continue every 6 weeks. Patient to call to make in person follow up with me in the next few months.

## 2022-01-16 DIAGNOSIS — E11622 Type 2 diabetes mellitus with other skin ulcer: Secondary | ICD-10-CM | POA: Diagnosis not present

## 2022-01-18 ENCOUNTER — Encounter: Payer: 59 | Attending: Physician Assistant | Admitting: Physician Assistant

## 2022-01-18 DIAGNOSIS — I872 Venous insufficiency (chronic) (peripheral): Secondary | ICD-10-CM | POA: Diagnosis not present

## 2022-01-18 DIAGNOSIS — L88 Pyoderma gangrenosum: Secondary | ICD-10-CM | POA: Insufficient documentation

## 2022-01-18 DIAGNOSIS — G4730 Sleep apnea, unspecified: Secondary | ICD-10-CM | POA: Diagnosis not present

## 2022-01-18 DIAGNOSIS — E11622 Type 2 diabetes mellitus with other skin ulcer: Secondary | ICD-10-CM | POA: Diagnosis present

## 2022-01-18 DIAGNOSIS — F17208 Nicotine dependence, unspecified, with other nicotine-induced disorders: Secondary | ICD-10-CM | POA: Insufficient documentation

## 2022-01-18 DIAGNOSIS — I10 Essential (primary) hypertension: Secondary | ICD-10-CM | POA: Insufficient documentation

## 2022-01-18 DIAGNOSIS — L97222 Non-pressure chronic ulcer of left calf with fat layer exposed: Secondary | ICD-10-CM | POA: Diagnosis not present

## 2022-01-18 NOTE — Progress Notes (Addendum)
VILAS, EDGERLY ([id number]) Visit Report for 01/18/2022 Arrival Information Details Patient Name: [name] Date of Service: 01/18/2022 8:15 AM Medical Record Number: [id number] Patient Account Number: [id number] Date of Birth/Sex: [birth year] (41 y.o. M) Treating RN: [name] Primary Care Suheyla Mortellaro: [name] Other Clinician: Massie Kluver Referring Jamael Hoffmann: [name] Treating Maryana Pittmon/Extender: [name] Cliche in Treatment: 267 Visit Information History Since Last Visit Added or deleted any medications: No Patient Arrived: Ambulatory Has Dressing in Place as Prescribed: Yes Arrival Time: 08:10 Has Compression in Place as Prescribed: Yes Accompanied By: self Pain Present Now: No Transfer Assistance: None Patient Identification Verified: Yes Secondary Verification Process Completed: Yes Patient Requires Transmission-Based No Precautions: Patient Has Alerts: Yes Patient Alerts: Patient has reaction to silver dressings. Electronic Signature(s) Signed: 01/18/2022 12:54:01 PM By: [name], [name], RN, [name], [name], [name] Entered By: [name], [name], RN, [name], [name] on 01/18/2022 08:10:34 [name] ([id number]) -------------------------------------------------------------------------------- Clinic Level of Care Assessment Details Patient Name: [name] Date of Service: 01/18/2022 8:15 AM Medical Record Number: [id number] Patient Account Number: [id number] Date of Birth/Sex: Oct 12, 1980 (40 y.o. M) Treating RN: [name] Primary Care Talena Neira: [name] Other Clinician: Massie Kluver Referring Zamari Vea: [name] Treating Takeria Marquina/Extender: [name] Cliche in Treatment: 267 Clinic Level of Care Assessment Items TOOL 1 Quantity Score []  - Use when EandM and Procedure is performed on INITIAL visit 0 ASSESSMENTS - Nursing Assessment / Reassessment []  - General Physical Exam (combine w/ comprehensive assessment (listed just below) when performed on new 0 pt.  evals) []  - 0 Comprehensive Assessment (HX, ROS, Risk Assessments, Wounds Hx, etc.) ASSESSMENTS - Wound and Skin Assessment / Reassessment []  - Dermatologic / Skin Assessment (not related to wound area) 0 ASSESSMENTS - Ostomy and/or Continence Assessment and Care []  - Incontinence Assessment and Management 0 []  - 0 Ostomy Care Assessment and Management (repouching, etc.) PROCESS - Coordination of Care []  - Simple Patient / Family Education for ongoing care 0 []  - 0 Complex (extensive) Patient / Family Education for ongoing care []  - 0 Staff obtains Programmer, systems, Records, Test Results / Process Orders []  - 0 Staff telephones HHA, Nursing Homes / Clarify orders / etc []  - 0 Routine Transfer to another Facility (non-emergent condition) []  - 0 Routine Hospital Admission (non-emergent condition) []  - 0 New Admissions / Biomedical engineer / Ordering NPWT, Apligraf, etc. []  - 0 Emergency Hospital Admission (emergent condition) PROCESS - Special Needs []  - Pediatric / Minor Patient Management 0 []  - 0 Isolation Patient Management []  - 0 Hearing / Language / Visual special needs []  - 0 Assessment of Community assistance (transportation, D/C planning, etc.) []  - 0 Additional assistance / Altered mentation []  - 0 Support Surface(s) Assessment (bed, cushion, seat, etc.) INTERVENTIONS - Miscellaneous []  - External ear exam 0 []  - 0 Patient Transfer (multiple staff / Civil Service fast streamer / Similar devices) []  - 0 Simple Staple / Suture removal (25 or less) []  - 0 Complex Staple / Suture removal (26 or more) []  - 0 Hypo/Hyperglycemic Management (do not check if billed separately) []  - 0 Ankle / Brachial Index (ABI) - do not check if billed separately Has the patient been seen at the hospital within the last three years: Yes Total Score: 0 Level Of Care: ____ [name] ([id number]) Electronic Signature(s) Signed: 01/18/2022 12:54:01 PM By: [name], [name], RN, [name], [name], [name] Entered By:  [name], [name], RN, [name], [name] on 01/18/2022 08:42:34 [name] ([id number]) -------------------------------------------------------------------------------- Compression Therapy Details Patient Name: [name] Date of Service:  01/18/2022 8:15 AM Medical Record Number: [id number] Patient Account Number: [id number] Date of Birth/Sex: [birth year] (40 y.o. M) Treating RN: [name] Primary Care Cia Garretson: [name] Other Clinician: Massie Kluver Referring Jacelynn Hayton: [name] Treating Timberlee Roblero/Extender: [name] Cliche in Treatment: 267 Compression Therapy Performed for Wound Assessment: Wound #1 Left,Lateral Lower Leg Performed By: Clinician Clifton James, Angie, Compression Type: Four Layer Pre Treatment ABI: 1.2 Post Procedure Diagnosis Same as Pre-procedure Electronic Signature(s) Signed: 01/18/2022 12:54:01 PM By: [name], [name], RN, [name], [name], [name] Entered By: [name], [name], RN, [name], [name] on 01/18/2022 08:34:47 [name] ([id number]) -------------------------------------------------------------------------------- Encounter Discharge Information Details Patient Name: [name] Date of Service: 01/18/2022 8:15 AM Medical Record Number: [id number] Patient Account Number: [id number] Date of Birth/Sex: [birth year] (40 y.o. M) Treating RN: [name] Primary Care Maha Fischel: [name] Other Clinician: Massie Kluver Referring Chandell Attridge: [name] Treating Rhett Najera/Extender: [name] Cliche in Treatment: 267 Encounter Discharge Information Items Post Procedure Vitals Discharge Condition: Stable Temperature (F): 98.3 Ambulatory Status: Ambulatory Pulse (bpm): 91 Discharge Destination: Home Respiratory Rate (breaths/min): 16 Transportation: Private Auto Blood Pressure (mmHg): 141/86 Accompanied By: self Schedule Follow-up Appointment: Yes Clinical Summary of Care: Electronic Signature(s) Signed: 01/18/2022 12:54:01 PM By: [name], [name], RN, [name], [name], [name] Entered  By: [name], [name], RN, [name], [name] on 01/18/2022 08:44:44 [name] ([id number]) -------------------------------------------------------------------------------- Lower Extremity Assessment Details Patient Name: [name] Date of Service: 01/18/2022 8:15 AM Medical Record Number: [id number] Patient Account Number: [id number] Date of Birth/Sex: March 07, 1981 (40 y.o. M) Treating RN: [name] Primary Care Eleazar Kimmey: [name] Other Clinician: Massie Kluver Referring Hula Tasso: [name] Treating Helina Hullum/Extender: [name] Cliche in Treatment: 267 Edema Assessment Assessed: [Left: Yes] [Right: No] Edema: [Left: N] [Right: o] Calf Left: Right: Point of Measurement: 35 cm From Medial Instep 46.4 cm Ankle Left: Right: Point of Measurement: 10 cm From Medial Instep 27.9 cm Vascular Assessment Pulses: Dorsalis Pedis Palpable: [Left:Yes] Posterior Tibial Palpable: [Left:Yes] Electronic Signature(s) Signed: 01/18/2022 12:54:01 PM By: [name], [name], RN, [name], [name], [name] Entered By: [name], [name], RN, [name], [name] on 01/18/2022 08:24:48 [name] ([id number]) -------------------------------------------------------------------------------- Multi Wound Chart Details Patient Name: [name] Date of Service: 01/18/2022 8:15 AM Medical Record Number: [id number] Patient Account Number: [id number] Date of Birth/Sex: [birth year] (40 y.o. M) Treating RN: [name] Primary Care Jaely Silman: [name] Other Clinician: Massie Kluver Referring Tationa Stech: [name] Treating Jaye Polidori/Extender: [name] Cliche in Treatment: 267 Vital Signs Height(in): 71 Pulse(bpm): 91 Weight(lbs): 338 Blood Pressure(mmHg): 141/86 Body Mass Index(BMI): 47.1 Temperature(F): 98.3 Respiratory Rate(breaths/min): 18 Photos: [N/A:N/A] Wound Location: Left, Lateral Lower Leg N/A N/A Wounding Event: Gradually Appeared N/A N/A Primary Etiology: Pyoderma N/A N/A Comorbid History: Sleep Apnea,  Hypertension, Colitis N/A N/A Date Acquired: 11/18/2015 N/A N/A Weeks of Treatment: 267 N/A N/A Wound Status: Open N/A N/A Wound Recurrence: No N/A N/A Measurements L x W x D (cm) 1.8x1x0.1 N/A N/A Area (cm) : 1.414 N/A N/A Volume (cm) : 0.141 N/A N/A % Reduction in Area: 71.20% N/A N/A % Reduction in Volume: 96.40% N/A N/A Classification: Full Thickness With Exposed N/A N/A Support Structures Exudate Amount: Medium N/A N/A Exudate Type: Serosanguineous N/A N/A Exudate Color: red, brown N/A N/A Granulation Amount: Large (67-100%) N/A N/A Granulation Quality: Red N/A N/A Necrotic Amount: Small (1-33%) N/A N/A Exposed Structures: Fat Layer (Subcutaneous Tissue): N/A N/A Yes Fascia: No Tendon: No Muscle: No Joint: No Bone: No Epithelialization: Medium (34-66%) N/A N/A Treatment Notes Electronic Signature(s) Signed: 01/18/2022 12:54:01 PM By: [name], [name], RN, [name], [name]  RN, [name] Entered By: [name], [name], RN, [name], [name] on 01/18/2022 95:28:41 [name], [name] ([id number]) -------------------------------------------------------------------------------- Multi-Disciplinary Care Plan Details Patient Name: [name] Date of Service: 01/18/2022 8:15 AM Medical Record Number: [id number] Patient Account Number: [id number] Date of Birth/Sex: 1980/11/19 (40 y.o. M) Treating RN: [name] Primary Care Brandun Pinn: [name] Other Clinician: Massie Kluver Referring Duha Abair: [name] Treating Stephens Shreve/Extender: [name] Cliche in Treatment: [place] reviewed with physician Active Inactive Electronic Signature(s) Signed: 01/18/2022 12:54:01 PM By: [name], [name], RN, [name], [name], [name] Entered By: [name], [name], RN, [name], [name] on 01/18/2022 08:24:57 [name] ([id number]) -------------------------------------------------------------------------------- Pain Assessment Details Patient Name: [name] Date of Service: 01/18/2022 8:15 AM Medical Record Number:  [id number] Patient Account Number: [id number] Date of Birth/Sex: 12-30-1980 (40 y.o. M) Treating RN: [name] Primary Care Shanece Cochrane: [name] Other Clinician: Massie Kluver Referring Taralynn Quiett: [name] Treating Honestie Kulik/Extender: [name] Cliche in Treatment: 267 Active Problems Location of Pain Severity and Description of Pain Patient Has Paino No Site Locations Pain Management and Medication Current Pain Management: Electronic Signature(s) Signed: 01/18/2022 12:54:01 PM By: [name], [name], RN, [name], [name], [name] Entered By: [name], [name], RN, [name], [name] on 01/18/2022 08:14:47 [name] ([id number]) -------------------------------------------------------------------------------- Patient/Caregiver Education Details Patient Name: [name] Date of Service: 01/18/2022 8:15 AM Medical Record Number: [id number] Patient Account Number: [id number] Date of Birth/Gender: 1980-10-06 (40 y.o. M) Treating RN: [name] Primary Care Physician: [name] Other Clinician: Massie Kluver Referring Physician: [name] Treating Physician/Extender: [name] Cliche in Treatment: 23 Education Assessment Education Provided To: Patient Education Topics Provided Wound/Skin Impairment: Handouts: Other: continue wound care as directed Methods: Explain/Verbal Responses: State content correctly Electronic Signature(s) Signed: 01/18/2022 12:54:01 PM By: [name], [name], RN, [name], [name], [name] Entered By: [name], [name], RN, [name], [name] on 01/18/2022 08:43:03 [name] ([id number]) -------------------------------------------------------------------------------- Wound Assessment Details Patient Name: [name] Date of Service: 01/18/2022 8:15 AM Medical Record Number: [id number] Patient Account Number: [id number] Date of Birth/Sex: 1980-12-22 (40 y.o. M) Treating RN: [name] Primary Care Kameron Blethen: [name] Other Clinician: Massie Kluver Referring Masaki Rothbauer: [name] Treating Makennah Omura/Extender: [name] Cliche in Treatment: 267 Wound Status Wound Number: 1 Primary Etiology: Pyoderma Wound Location: Left, Lateral Lower Leg Wound Status: Open Wounding Event: Gradually Appeared Comorbid History: Sleep Apnea, Hypertension, Colitis Date Acquired: 11/18/2015 Weeks Of Treatment: 267 Clustered Wound: No Photos Wound Measurements Length: (cm) 1.8 Width: (cm) 1 Depth: (cm) 0.1 Area: (cm) 1.414 Volume: (cm) 0.141 % Reduction in Area: 71.2% % Reduction in Volume: 96.4% Epithelialization: Medium (34-66%) Wound Description Classification: Full Thickness With Exposed Support Structures Exudate Amount: Medium Exudate Type: Serosanguineous Exudate Color: red, brown Foul Odor After Cleansing: No Slough/Fibrino No Wound Bed Granulation Amount: Large (67-100%) Exposed Structure Granulation Quality: Red Fascia Exposed: No Necrotic Amount: Small (1-33%) Fat Layer (Subcutaneous Tissue) Exposed: Yes Necrotic Quality: Adherent Slough Tendon Exposed: No Muscle Exposed: No Joint Exposed: No Bone Exposed: No Treatment Notes Wound #1 (Lower Leg) Wound Laterality: Left, Lateral Cleanser Wound Cleanser Discharge Instruction: Wash your hands with soap and water. Remove old dressing, discard into plastic bag and place into trash. Cleanse the wound with Wound Cleanser prior to applying a clean dressing using gauze sponges, not tissues or cotton balls. Do not scrub or use excessive force. Pat dry using gauze sponges, not tissue or cotton balls. [name], [name] ([id number]) Peri-Wound Care Desitin Maximum Strength Ointment 4 (oz) Discharge Instruction: Apply around wound edges to avoid maceration Triamcinolone Acetonide Cream, 0.1%, 15 (  g) tube Discharge Instruction: Apply to leg Topical Clobetasol Propionate ointment 0.05%, 60 (g) tube Discharge Instruction: Pt to bring to appt- applied to wound bed Primary Dressing Apligraf Secondary  Dressing (NON-BORDER) Zetuvit Plus Silicone NON-BORDER 5x5 (in/in) Secured With Compression Wrap Medichoice 4 layer Compression System, 35-40 mmHG Discharge Instruction: Apply multi-layer wrap as directed. Compression Stockings Environmental education officer) Signed: 01/18/2022 12:54:01 PM By: [name], [name], RN, [name], [name], [name] Entered By: [name], [name], RN, [name], [name] on 01/18/2022 08:23:55 [name], [name] ([id number]) -------------------------------------------------------------------------------- Vitals Details Patient Name: [name] Date of Service: 01/18/2022 8:15 AM Medical Record Number: [id number] Patient Account Number: [id number] Date of Birth/Sex: July 07, 1980 (40 y.o. M) Treating RN: [name] Primary Care Coty Larsh: [name] Other Clinician: Massie Kluver Referring Sady Monaco: [name] Treating Navaeh Kehres/Extender: [name] Cliche in Treatment: 267 Vital Signs Time Taken: 08:10 Temperature (F): 98.3 Height (in): 71 Pulse (bpm): 91 Weight (lbs): 338 Respiratory Rate (breaths/min): 18 Body Mass Index (BMI): 47.1 Blood Pressure (mmHg): 141/86 Reference Range: 80 - 120 mg / dl Electronic Signature(s) Signed: 01/18/2022 12:54:01 PM By: [name], [name], RN, [name], [name], [name] Entered By: [name], [name], RN, [name], [name] on 01/18/2022 37:79:39

## 2022-01-18 NOTE — Progress Notes (Addendum)
ITALO, BANTON ([id number]) Visit Report for 01/18/2022 Chief Complaint Document Details Patient Name: [name] Date of Service: 01/18/2022 8:15 AM Medical Record Number: [id number] Patient Account Number: [id number] Date of Birth/Sex: 1981-03-01 (41 y.o. 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 M) Treating RN: [name] Primary Care Provider: [name] Other Clinician: Massie Kluver Referring Provider: [name] Treating Provider/Extender: [name] Cliche in Treatment: 267 Cellular or Tissue Based Product Type Wound #1 Left,Lateral Lower Leg Applied to: Performed By: Physician Tommie Sams., PA-C Cellular or Tissue Based Product Apligraf Type: Level of Consciousness (Pre- Awake and Alert procedure): Pre-procedure Verification/Time Out Yes - 08:36 Taken: Location: trunk / arms / legs Wound Size (sq cm): 1.8 Product Size (sq cm): 44 Waste Size (sq cm): 11 Amount of Product Applied (sq cm): 33 Instrument Used: Blade, Scissors Lot #: [id number] Expiration Date: 01/25/2022 Fenestrated: Yes Instrument: Blade Reconstituted: Yes Solution Type: normal saline Solution Amount: 13 mls Lot #: [id number] Solution Expiration Date: 07/19/2023 Secured: Yes Secured  With: Steri-Strips Dressing Applied: Yes Primary Dressing: bolus Procedural Pain: 0 Post Procedural Pain: 0 Response to Treatment: Procedure was tolerated well Level of Consciousness (Post- Awake and Alert procedure): Post Procedure Diagnosis Same as Pre-procedure Electronic Signature(s) Signed: 01/18/2022 12:54:01 PM By: [name], [name], RN, [name], [name], [name] Entered By: [name], [name], RN, [name], [name] on 01/18/2022 08:41:28 [name] ([id number]) -------------------------------------------------------------------------------- HPI Details Patient Name: [name] Date of Service: 01/18/2022 8:15 AM Medical Record Number: [id number] Patient Account Number: [id number] Date of Birth/Sex: 02-21-1981 (40 y.o. M) Treating RN: [name] Primary Care Provider: [name] Other Clinician: Massie Kluver Referring Provider: [name] Treating Provider/Extender: [name] Cliche in Treatment: 267 History of Present Illness HPI Description: 12/04/16; 41 year old man who comes into the clinic today for review of a wound on the posterior left calf. He tells me that is been there for about a year. He is not a diabetic he does smoke half a pack per day. He was seen in the ER on 11/20/16 felt to have cellulitis around the wound and was given clindamycin. An x-ray did not show osteomyelitis. The patient initially tells me that he has a milk allergy that sets off a pruritic itching rash on his lower legs which she scratches incessantly and he thinks that's what may have set up the wound. He has been using various topical antibiotics and ointments without any effect. He works in a [employer] and is on his feet all day. He does not have a prior history of wounds however he does have the rash on both lower legs the right arm and the ventral aspect of his left arm. These are excoriations and clearly have had scratching however there are of macular looking areas on both legs including a substantial larger  area on the right leg. This does not have an underlying open area. There is no blistering. The patient tells me that 2 years ago in [place] in response to the rash on  his legs he saw a dermatologist who told him he had a condition which may be pyoderma gangrenosum although I may be putting words into his mouth. He seemed to recognize this. On further questioning he admits to a 5 year history of quiesced. ulcerative colitis. He is not in any treatment for this. He's had no recent travel 12/11/16; the patient arrives today with his wound and roughly the same condition we've been using silver alginate this is a deep punched out wound with some surrounding erythema but no tenderness. Biopsy I did did not show confirmed pyoderma gangrenosum suggested nonspecific inflammation and vasculitis but does not provide an actual description of what was seen by the pathologist. I'm really not able to understand this We have also received information from the patient's dermatologist in [place] notes from April 2016. This was a doctor Agarwal-antal. 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 He was prescribed topical steroid high potency under occlusion which helped but at this point the patient did not have a deep punched out wound. 12/18/16; the patient's wound is larger in terms of surface area however this surface looks better and there is less depth. The surrounding erythema also is better. The patient states that the wrap we put on came off 2 days ago when he has been using his compression stockings. He we are in the process of getting a dermatology consult. 12/26/16 on evaluation today patient's left lower extremity wound shows evidence of infection with surrounding erythema noted. He has been tolerating the dressing changes but states that he has noted more discomfort. There is a larger area of erythema surrounding the wound. No fevers, chills, nausea, or vomiting noted at this time. With that being said  the wound still does have slough covering the surface. He is not allergic to any medication that he is aware of at this point. In regard to his right lower extremity he had several regions that are erythematous and pruritic he wonders if there's anything we can do to help that. 01/02/17 I reviewed patient's wound culture which was obtained his visit last week. He was placed on doxycycline at that point. Unfortunately that does not appear to be an antibiotic that would likely help with the situation however the pseudomonas noted on culture is sensitive to Cipro. Also unfortunately patient's wound seems to have a large compared to last week's evaluation. Not severely so but there are definitely increased measurements in general. He is continuing to have discomfort as well he writes this to be a seven out of 10. In fact he would prefer me not to perform any debridement today due to the fact that he is having discomfort and considering he has an active infection on the little reluctant to do so anyway. No fevers, chills, nausea, or vomiting noted at this time. 01/08/17; patient seems dermatology on September 5. I suspect dermatology will want the slides from the biopsy I did sent to their pathologist. I'm not sure if there is a way we can expedite that. In any case the culture I did before I left on vacation 3 weeks ago showed Pseudomonas he was given 10 days of Cipro and per her description of her intake nurses is actually somewhat better this week although the wound is quite a bit bigger than I remember the last time I saw this. He still has 3 more days of Cipro 01/21/17; dermatology appointment tomorrow. He has completed the ciprofloxacin for Pseudomonas. Surface of the wound looks better however  he is had some deterioration in the lesions on his right leg. Meantime the left lateral leg wound we will continue with sample 01/29/17; patient had his dermatology appointment but I can't yet see that note. He is  completed his antibiotics. The wound is more superficial but considerably larger in circumferential area than when he came in. This is in his left lateral calf. He also has swollen erythematous areas with superficial wounds on the right leg and small papular areas on both arms. There apparently areas in her his upper thighs and buttocks I did not look at those. Dermatology biopsied the right leg. Hopefully will have their input next week. 02/05/17; patient went back to see his dermatologist who told him that he had a "scratching problem" as well as staph. He is now on a 30 day course of doxycycline and I believe she gave him triamcinolone cream to the right leg areas to help with the itching [not exactly sure but probably triamcinolone]. She apparently looked at the left lateral leg wound although this was not rebiopsied and I think felt to be ultimately part of the same pathogenesis. He is using sample border foam and changing nevus himself. He now has a new open area on the right posterior leg which was his biopsy site I don't have any of the dermatology notes 02/12/17; we put the patient in compression last week with SANTYL to the wound on the left leg and the biopsy. Edema is much better and the depth of the wound is now at level of skin. Area is still the same oBiopsy site on the right lateral leg we've also been using santyl with a border foam dressing and he is changing this himself. 02/19/17; Using silver alginate started last week to both the substantial left leg wound and the biopsy site on the right wound. He is tolerating compression well. Has a an appointment with his primary M.D. tomorrow wondering about diuretics although I'm wondering if the edema problem is actually lymphedema 02/26/17; the patient has been to see his primary doctor Dr. Jerrel Ivory at [facility] our primary care. She started him on Lasix 20 mg and this seems to have helped with the edema. However we are not making  substantial change with the left lateral calf wound and inflammation. The biopsy site on the right leg also looks stable but not really all that different. 03/12/17; the patient has been to see vein and vascular Dr. Lucky Cowboy. He has had venous reflux studies I have not reviewed these. I did get a call from his dermatology office. They felt that he might have pathergy based on their biopsy on his right leg which led them to look at the slides of HARROLD, FITCHETT ([id number]) the biopsy I did on the left leg and they wonder whether this represents pyoderma gangrenosum which was the original supposition in a man with ulcerative colitis albeit inactive for many years. They therefore recommended clobetasol and tetracycline i.e. aggressive treatment for possible pyoderma gangrenosum. 03/26/17; apparently the patient just had reflux studies not an appointment with Dr. dew. She arrives in clinic today having applied clobetasol for 2-3 weeks. He notes over the last 2-3 days excessive drainage having to change the dressing 3-4 times a day and also expanding erythema. He states the expanding erythema seems to come and go and was last this red was earlier in the month.he is on doxycycline 150 mg twice a day as an anti-inflammatory systemic therapy for possible pyoderma gangrenosum along with the  topical clobetasol 04/02/17; the patient was seen last week by Dr. Lillia Carmel at [facility] dermatology locally who kindly saw him at my request. 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 My concern was the degree of erythema medially extending from his left leg wound which was either inflammation from pyoderma or cellulitis. I put him on Augmentin however culture of the wound showed Pseudomonas which is quinolone sensitive. I really don't believe he has cellulitis however in view of everything I will continue and give him a course of Cipro. He is also on doxycycline as an  immune modulator for the pyoderma. In addition to his original wound on the left lateral leg with surrounding erythema he has a wound on the right posterior calf which was an original biopsy site done by dermatology. This was felt to represent pathergy from pyoderma gangrenosum 04/16/17; pyoderma gangrenosum. Saw Dr. Lillia Carmel yesterday. He has been using topical antibiotics to both wound areas his original wound on the left and the biopsies/pathergy area on the right. There is definitely some improvement in the inflammation around the wound on the right although the patient states he has increasing sensitivity of the wounds. He is on prednisone 60 and doxycycline 1 as prescribed by Dr. Lillia Carmel. He is covering the topical antibiotic with gauze and putting this in his own compression stocks and changing this daily. He states that Dr. Lottie Rater did a culture of the left leg wound yesterday 05/07/17; pyoderma gangrenosum. The patient saw Dr. Lillia Carmel yesterday and has a follow-up with her in one month. He is still using topical antibiotics to both wounds although he can't recall exactly what type. He is still on prednisone 60 mg. Dr. Lillia Carmel stated that the doxycycline could stop if we were in agreement. 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 The right medial leg was induced by biopsy/pathergy. The area on the right is essentially healed. Still on high-dose prednisone using topical antibiotics to the wound 07/09/17; pyoderma gangrenosum with wounds on the left lateral leg. The right medial leg has closed and remains closed. He is still on prednisone 60. oHe tells me he missed his last dermatology appointment with Dr. Lillia Carmel but will make another appointment. He reports that her blood sugar at a recent screen in [place] was high 200's. He was 180 today. 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 I think he is going to require still much longer prednisone perhaps another 3 months before attempting to taper. In the meantime his wound is a lot better. Smaller. He is cleaning this off daily and applying topical antibiotics. When he was last in the clinic I thought about changing to [facility] and actually put in a couple of calls to dermatology although probably not during their business hours. In any case the wound looks better smaller I don't think there is any need to change what he is doing 08/06/17-he is here in follow up evaluation for pyoderma left leg ulcer. He continues on oral prednisone. He has been using triple antibiotic ointment. There is surface debris and we will transition to [facility] and have him return in 2 weeks. He has lost 30 pounds since his last appointment with lifestyle modification. He may benefit from topical steroid cream for treatment this can be considered at a later date. 08/22/17 on evaluation today patient appears to actually be  doing rather well in regard to his left lateral lower extremity ulcer. He has actually been managed by Dr. Dellia Nims most recently. Patient is currently on oral steroids at this time. This seems to have been of benefit for him. Nonetheless his last visit was actually with Leah on 08/06/17. Currently he is not utilizing any topical steroid creams although this could be of benefit as well. No fevers, chills, nausea, or vomiting noted at this time. 09/05/17 on evaluation today patient appears to be doing better in regard to his left lateral lower extremity ulcer. He has been tolerating the dressing changes without complication. He is using Santyl with good effect. Overall I'm very pleased with how things are standing at this point. Patient likewise is happy that this is doing better. 09/19/17 on evaluation today patient actually appears to be doing rather well in regard to his left lateral lower extremity ulcer. Again this  is secondary to Pyoderma gangrenosum and he seems to be progressing well with the Santyl which is good news. He's not having any significant pain. 10/03/17 on evaluation today patient appears to be doing excellent in regard to his lower extremity wound on the left secondary to Pyoderma gangrenosum. He has been tolerating the Santyl without complication and in general I feel like he's making good progress. 10/17/17 on evaluation today patient appears to be doing very well in regard to his left lateral lower surety ulcer. He has been tolerating the dressing changes without complication. There does not appear to be any evidence of infection he's alternating the Santyl and the triple antibiotic ointment every other day this seems to be doing well for him. 11/03/17 on evaluation today patient appears to be doing very well in regard to his left lateral lower extremity ulcer. He is been tolerating the dressing changes without complication which is good news. Fortunately there does not appear to be any evidence of infection which is also great news. Overall is doing excellent they are starting to taper down on the prednisone is down to 40 mg at this point it also started topical clobetasol for him. 11/17/17 on evaluation today patient appears to be doing well in regard to his left lateral lower surety ulcer. He's been tolerating the dressing changes without complication. He does note that he is having no pain, no excessive drainage or discharge, and overall he feels like things are going about how he would expect and hope they would. Overall he seems to have no evidence of infection at this time in my opinion which is good news. 12/04/17-He is seen in follow-up evaluation for right lateral lower extremity ulcer. He has been applying topical steroid cream. Today's measurement show slight increase in size. Over the next 2 weeks we will transition to every other day Santyl and steroid cream. He has been encouraged  to monitor for changes and notify clinic with any concerns 12/15/17 on evaluation today patient's left lateral motion the ulcer and fortunately is doing worse again at this point. This just since last week to this week has close to doubled in size according to the patient. I did not seeing last week's I do not have a visual to compare this to in our system was also down so we do not have all the charts and at this point. Nonetheless it does have me somewhat concerned in regard to the fact that again he was worried enough about it he has contact the dermatology that placed them back on the full strength, 50 mg  a day of the prednisone that he was taken previous. He continues to alternate using clobetasol along with Santyl at this point. He is obviously somewhat frustrated. 12/22/17 on evaluation today patient appears to be doing a little worse compared to last evaluation. Unfortunately the wound is a little deeper and slightly larger than the last week's evaluation. With that being said he has made some progress in regard to the irritation surrounding at this time unfortunately despite that progress that's been made he still has a significant issue going on here. 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 ALMALIK, WEISSBERG ([id number]) For that reason I discussed with him today the possibility of perform a wound culture to ensure there's nothing overtly infected. 01/06/18 on evaluation today patient's wound is larger and deeper than previously evaluated. With that being said it did appear that his wound was infected after my last evaluation with him. Subsequently I did end up prescribing a prescription for Bactrim DS which she has been taking and having no complication with. Fortunately there does not appear to be any evidence of infection at this point in time as  far as anything spreading, no want to touch, and overall I feel like things are showing signs of improvement. 01/13/18 on evaluation today patient appears to be even a little larger and deeper than last time. There still muscle exposed in the base of the wound. Nonetheless he does appear to be less erythematous I do believe inflammation is calming down also believe the infection looks like it's probably resolved at this time based on what I'm seeing. No fevers, chills, nausea, or vomiting noted at this time. 01/30/18 on evaluation today patient actually appears to visually look better for the most part. Unfortunately those visually this looks better he does seem to potentially have what may be an abscess in the muscle that has been noted in the central portion of the wound. This is the first time that I have noted what appears to be fluctuance in the central portion of the muscle. With that being said I'm somewhat more concerned about the fact that this might indicate an abscess formation at this location. I do believe that an ultrasound would be appropriate. This is likely something we need to try to do as soon as possible. He has been switch to mupirocin ointment and he is no longer using the steroid ointment as prescribed by dermatology he sees them again next week he's been decreased from 60 to 40 mg of prednisone. 03/09/18 on evaluation today patient actually appears to be doing a little better compared to last time I saw him. There's not as much erythema surrounding the wound itself. He I did review his most recent infectious disease note which was dated 02/24/18. He saw Dr. Michel Bickers in [place]. 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 Patient is now off of antibiotics and they are continually observing this. There seems to be no change in the past two weeks in my pinion based on what the patient says  and what I see today compared to what Dr. Megan Salon likely saw two weeks ago. No fevers, chills, nausea, or vomiting noted at this time. 03/23/18 on evaluation today patient's wound actually appears to be showing signs of improvement which is  good news. He is currently still on the Dapsone. He is also working on tapering the prednisone to get off of this and Dr. Lottie Rater is working with him in this regard. Nonetheless overall I feel like the wound is doing well it does appear based on the infectious disease note that I reviewed from Dr. 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 I did also review the results of his ultrasound of left lower extremity which revealed there was a dentist tissue in the base of the wound without an abscess noted. 04/06/18 on evaluation today the patient's left lateral lower extremity ulcer actually appears to be doing fairly well which is excellent news. There does not appear to be any evidence of infection at this time which is also great news. Overall he still does have a significantly large ulceration although little by little he seems to be making progress. He is down to 10 mg a day of the prednisone. 04/20/18 on evaluation today patient actually appears to be doing excellent at this time in regard to his left lower extremity ulcer. He's making signs of good progress unfortunately this is taking much longer than we would really like to see but nonetheless he is making progress. Fortunately there does not appear to be any evidence of infection at this time. No fevers, chills, nausea, or vomiting noted at this time. The patient has not been using the Santyl due to the cost he hadn't got in this field yet. He's mainly been using the antibiotic ointment topically. Subsequently he also tells me that he really has not been scrubbing in the shower I think this would  be helpful again as I told him it doesn't have to be anything too aggressive to even make it believe just enough to keep it free of some of the loose slough and biofilm on the wound surface. 05/11/18 on evaluation today patient's wound appears to be making slow but sure progress in regard to the left lateral lower extremity ulcer. He is been tolerating the dressing changes without complication. Fortunately there does not appear to be any evidence of infection at this time. He is still just using triple antibiotic ointment along with clobetasol occasionally over the area. He never got the Santyl and really does not seem to intend to in my pinion. 06/01/18 on evaluation today patient appears to be doing a little better in regard to his left lateral lower extremity ulcer. He states that overall he does not feel like he is doing as well with the Dapsone as he did with the prednisone. Nonetheless he sees his dermatologist later today and is gonna talk to them about the possibility of going back on the prednisone. Overall again I believe that the wound would be better if you would utilize Santyl but he really does not seem to be interested in going back to the [place] at this point. He has been using triple antibiotic ointment. 06/15/18 on evaluation today patient's wound actually appears to be doing about the same at this point. Fortunately there is no signs of infection at this time. He has made slight improvements although he continues to not really want to clean the wound bed at this point. He states that he just doesn't mess with it he doesn't want to cause any problems with everything else he has going on. He has been on medication, antibiotics as  prescribed by his dermatologist, for a staff infection of his lower extremities which is really drying out now and looking much better he tells me. Fortunately there is no sign of overall infection. 06/29/18 on evaluation today patient appears to be doing well  in regard to his left lateral lower surety ulcer all things considering. Fortunately his staff infection seems to be greatly improved compared to previous. He has no signs of infection and this is drying up quite nicely. He is still the doxycycline for this is no longer on cental, Dapsone, or any of the other medications. His dermatologist has recommended possibility of an infusion but right now he does not want to proceed with that. 07/13/18 on evaluation today patient appears to be doing about the same in regard to his left lateral lower surety ulcer. Fortunately there's no signs of infection at this time which is great news. Unfortunately he still builds up a significant amount of Slough/biofilm of the surface of the wound he still is not really cleaning this as he should be appropriately. Again I'm able to easily with saline and gauze remove the majority of this on the surface which if you would do this at home would likely be a dramatic improvement for him as far as getting the area to improve. Nonetheless overall I still feel like he is making progress is just very slow. I think Santyl will be of benefit for him as well. Still he has not gotten this as of this point. 07/27/18 on evaluation today patient actually appears to be doing little worse in regards of the erythema around the periwound region of the wound he also tells me that he's been having more drainage currently compared to what he was experiencing last time I saw him. He states not quite as bad as what he had because this was infected previously but nonetheless is still appears to be doing poorly. Fortunately there is no evidence of systemic infection at this point. The patient tells me that he is not going to be able to afford the Santyl. He is still waiting to hear about the infusion therapy with his dermatologist. Apparently she wants an updated colonoscopy first. 08/10/18 on evaluation today patient appears to be doing better in  regard to his left lateral lower extremity ulcer. Fortunately he is showing signs of improvement in this regard he's actually been approved for Remicade infusion's as well although this has not been scheduled as of yet. Fortunately there's no signs of active infection at this time in regard to the wound although he is having some issues with infection of the right lower extremity is been seen as dermatologist for this. Fortunately they are definitely still working with him trying to keep things under control. DONTAE, MINERVA ([id number]) 09/07/18 on evaluation today patient is actually doing rather well in regard to his left lateral lower extremity ulcer. He notes these actually having some hair grow back on his extremity which is something he has not seen in years. He also tells me that the pain is really not giving them any trouble at this time which is also good news overall she is very pleased with the progress he's using a combination of the mupirocin along with the probate is all mixed. 09/21/18 on evaluation today patient actually appears to be doing fairly well all things considered in regard to his looks from the ulcer. He's been tolerating the dressing changes without complication. Fortunately there's no signs of active infection at this time which  is good news he is still on all antibiotics or prevention of the staff infection. He has been on prednisone for time although he states it is gonna contact his dermatologist and see if she put them on a short course due to some irritation that he has going on currently. Fortunately there's no evidence of any overall worsening this is going very slow I think cental would be something that would be helpful for him although he states that $50 for tube is quite expensive. He therefore is not willing to get that at this point. 10/06/18 on evaluation today patient actually appears to be doing decently well in regard to his left lateral leg ulcer. He's been  tolerating the dressing changes without complication. Fortunately there's no signs of active infection at this time. Overall I'm actually rather pleased with the progress he's making although it's slow he doesn't show any signs of infection and he does seem to be making some improvement. I do believe that he may need a switch up and dressings to try to help this to heal more appropriately and quickly. 10/19/18 on evaluation today patient actually appears to be doing better in regard to his left lateral lower extremity ulcer. This is shown signs of having much less Slough buildup at this point due to the fact he has been using the [employer]. Obviously this is very good news. The overall size of the wound is not dramatically smaller but again the appearance is. 11/02/18 on evaluation today patient actually appears to be doing quite well in regard to his lower [place] ulcer. A lot of the skin around the ulcer is actually somewhat irritating at this point this seems to be more due to the dressing causing irritation from the adhesive that anything else. Fortunately there is no signs of active infection at this time. 11/24/18 on evaluation today patient appears to be doing a little worse in regard to his overall appearance of his lower extremity ulcer. There's more erythema and warmth around the wound unfortunately. He is currently on doxycycline which he has been on for some time. With that being said I'm not sure that seems to be helping with what appears to possibly be an acute cellulitis with regard to his left lower extremity ulcer. No fevers, chills, nausea, or vomiting noted at this time. 12/08/18 on evaluation today patient's wounds actually appears to be doing significantly better compared to his last evaluation. He has been using Santyl along with alternating tripling about appointment as well as the steroid cream seems to be doing quite well and the wound is showing signs of improvement which is  excellent news. Fortunately there's no evidence of infection and in fact his culture came back negative with only normal skin flora noted. 12/21/2018 upon evaluation today patient actually appears to be doing excellent with regard to his ulcer. This is actually the best that I have seen it since have been helping to take care of him. It is both smaller as well as less slough noted on the surface of the wound and seems to be showing signs of good improvement with new skin growing from the edges. He has been using just the triamcinolone he does wonder if he can get a refill of that ointment today. 01/04/2019 upon evaluation today patient actually appears to be doing well with regard to his left lateral lower extremity ulcer. With that being said it does not appear to be that he is doing quite as well as last time as  far as progression is concerned. There does not appear to be any signs of infection or significant irritation which is good news. With that being said I do believe that he may benefit from switching to a collagen based dressing based on how clean The wound appears. 01/18/2019 on evaluation today patient actually appears to be doing well with regard to his wound on the left lower extremity. He is not made a lot of progress compared to where we were previous but nonetheless does seem to be doing okay at this time which is good news. There is no signs of active infection which is also good news. My only concern currently is I do wish we can get him into utilizing the collagen dressing his insurance would not pay for the supplies that we ordered although it appears that he may be able to order this through his supply company that he typically utilizes. This is [employer]. Nonetheless he did try to order it during the office visit today and it appears this did go through. We will see if he can get that it is a different brand but nonetheless he has collagen and I do think will be beneficial. 02/01/2019  on evaluation today patient actually appears to be doing a little worse today in regard to the overall size of his wounds. Fortunately there is no signs of active infection at this time. That is visually. Nonetheless when this is happened before it was due to infection. For that reason were somewhat concerned about that this time as well. 02/08/2019 on evaluation today patient unfortunately appears to be doing slightly worse with regard to his wound upon evaluation today. Is measuring a little deeper and a little larger unfortunately. I am not really sure exactly what is causing this to enlarge he actually did see his dermatologist she is going to see about initiating Humira for him. Subsequently she also did do steroid injections into the wound itself in the periphery. Nonetheless still nonetheless he seems to be getting a little bit larger he is gone back to just using the steroid cream topically which I think is appropriate. I would say hold off on the collagen for the time being is definitely a good thing to do. Based on the culture results which we finally did get the final result back regarding it shows staph as the bacteria noted again that can be a normal skin bacteria based on the fact however he is having increased drainage and worsening of the wound measurement wise I would go ahead and place him on an antibiotic today I do believe for this. 02/15/2019 on evaluation today patient actually appears to be doing somewhat better in regard to his ulcer. There is no signs of worsening at this time I did review his culture results which showed evidence of Staphylococcus aureus but not MRSA. Again this could just be more related to the normal skin bacteria although he states the drainage has slowed down quite a bit he may have had a mild infection not just colonization. And was much smaller and then since around10/04/2019 on evaluation today patient appears to be doing unfortunately worse as far as the  size of the wound. I really feel like that this is steadily getting larger again it had been doing excellent right at the beginning of September we have seen a steady increase in the area of the wound it is almost 2-1/2 times the size it was on September 1. Obviously this is a bad trend this  is not wanting to see. For that reason we went back to using just the topical triamcinolone cream which does seem to help with inflammation. I checked him for bacteria by way of culture and nothing showed positive there. I am considering giving him a short course of a tapering steroid Dosepak today to see if that is can be beneficial for him. The patient is in agreement with giving that a try. 03/08/2019 on evaluation today patient appears to be doing very well in comparison to last evaluation with regard to his lower extremity ulcer. This is showing signs of less inflammation and actually measuring slightly smaller compared to last time every other week over the past month and a half he has been measuring larger larger larger. Nonetheless I do believe that the issue has been inflammation the prednisone does seem to [name], Jader ([id number]) have been beneficial for him which is good news. No fevers, chills, nausea, vomiting, or diarrhea. 03/22/2019 on evaluation today patient appears to be doing about the same with regard to his leg ulcer. He has been tolerating the dressing changes without complication. With that being said the wound seems to be mostly arrested at its current size but really is not making any progress except for when we prescribed the prednisone. He did show some signs of dropping as far as the overall size of the wound during that interval week. Nonetheless this is something he is not on long-term at this point and unfortunately I think he is getting need either this or else the Humira which his dermatologist has discussed try to get approval for. With that being said he will be seeing his  dermatologist on the 11th of this month that is November. 04/19/2019 on evaluation today patient appears to be doing really about the same the wound is measuring slightly larger compared to last time I saw him. He has not been into the office since November 2 due to the fact that he unfortunately had Covid as that his entire family. He tells me that it was rough but they did pull-through and he seems to be doing much better. Fortunately there is no signs of active infection at this time. No fevers, chills, nausea, vomiting, or diarrhea. 05/10/2019 on evaluation today patient unfortunately appears to be doing significantly worse as compared to last time I saw him. He does tell me that he has had his first dose of Humira and actually is scheduled to get the next one in the upcoming week. With that being said he tells me also that in the past several days he has been having a lot of issues with green drainage she showed me a picture this is more blue-green in color. He is also been having issues with increased sloughy buildup and the wound does appear to be larger today. Obviously this is not the direction that we want everything to take based on the starting of his Humira. Nonetheless I think this is definitely a result of likely infection and to be honest I think this is probably Pseudomonas causing the infection based on what I am seeing. 05/24/2019 on evaluation today patient unfortunately appears to be doing significantly worse compared to his prior evaluation with me 2 weeks ago. I did review his culture results which showed that he does have Staph aureus as well as Pseudomonas noted on the culture. Nonetheless the Levaquin that I prescribed for him does not appear to have been appropriate and in fact he tells me he is no  longer experiencing the green drainage and discharge that he had at the last visit. Fortunately there is no signs of active infection at this time which is good news although  the wound has significantly worsened it in fact is much deeper than it was previous. We have been utilizing up to this point triamcinolone ointment as the prescription topical of choice but at this time I really feel like that the wound is getting need to be packed in order to appropriately manage this due to the deeper nature of the wound. Therefore something along the lines of an alginate dressing may be more appropriate. 05/31/2019 upon inspection today patient's wound actually showed signs of doing poorly at this point. Unfortunately he just does not seem to be making any good progress despite what we have tried. He actually did go ahead and pick up the Cipro and start taking that as he was noticing more green drainage he had previously completed the Levaquin that I prescribed for him as well. Nonetheless he missed his appointment for the seventh last week on Wednesday with the wound care center and [facility] where his dermatologist referred him. Obviously I do think a second opinion would be helpful at this point especially in light of the fact that the patient seems to be doing so poorly despite the fact that we have tried everything that I really know how at this point. The only thing that ever seems to have helped him in the past is when he was on high doses of continual steroids that did seem to make a difference for him. 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 06/07/2019 upon evaluation today patient unfortunately appears to be doing worse yet again with regard to his wound. In fact I am starting to question whether or not he may have a fluid pocket in the muscle at this point based on the bulging and the soft appearance to the central portion of the muscle area. There is not anything draining from the muscle itself at this time  which is good news but nonetheless the wound is expanding. I am not really seeing any results of the Humira as far as overall wound progression based on what I am seeing at this point. The patient has been referred for second opinion with regard to his wound to the [facility] wound care center by his dermatologist which I definitely am not in opposition to. Unfortunately we tried multiple dressings in the past including collagen, alginate, and at one point even Hydrofera Blue. With that being said he is never really used it for any significant amount of time due to the fact that he often complains of pain associated with these dressings and then will go back to either using the Santyl which she has done intermittently or more frequently the triamcinolone. He is also using his own compression stockings. We have wrapped him in the past but again that was something else that he really was not a big fan of. 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 He seemed to do much better when he was on the steroids although honestly I understand that the steroids are not necessarily the best medication to be on long-term obviously 06/14/2019 on evaluation today patient appears to be doing actually a little bit better with regard to the overall appearance with his leg. Unfortunately he does continue to have issues with what appears to be some fluid underneath the muscle although he did see the wound specialty center at [facility] last week their main goals were to see about infusion therapy in place of the Humira as they feel like that is not quite strong enough. They also recommended that we continue with the  treatment otherwise as we are they felt like that was appropriate and they are okay with him continuing to follow-up here with [place] in that regard. With that being said they are also sending him to the vein specialist there to see about vein stripping and if that would be of benefit for him. Subsequently they also did not really address whether or not an ultrasound of the muscle area to see if there is anything that needs to be addressed here would be appropriate or not. For that reason I discussed this with him last week I think we may proceed down that road at this point. 06/21/2019 upon evaluation today patient's wound actually appears to be doing slightly better compared to previous evaluations. I do believe that he has made a difference with regard to the progression here with the use of oral steroids. Again in the past has been the only thing that is really calm things down. He does tell me that from [facility] is gotten a good news from there that there are no further vein stripping that is necessary at this point. I do not have that available for review today although the patient did relay this to me. He also did obtain and have the ultrasound of the wound completed which I did sign off on today. It does appear that there is no fluid collection under the muscle this is likely then just edematous tissue in general. That is also good news. Overall I still believe the inflammation is the main issue here. He did inquire about the possibility of a wound VAC again with the muscle protruding like it is I am not really sure whether the wound VAC is necessarily ideal or not. That is something we will have to consider although I do believe he may need compression wrapping to try to help with edema control which could potentially be of benefit. 06/28/2019 on evaluation today patient appears to be doing slightly better measurement wise although this is not terribly smaller he least seems to be trending towards that  direction. With that being said he still seems to have purulent drainage noted in the wound bed at this time. He has been on Levaquin followed by Cipro over the past month. Unfortunately he still seems to have some issues with active infection at this time. I did perform a culture last week in order to evaluate and see if indeed there was still anything going on. Subsequently the culture did come back showing Pseudomonas which is consistent with the drainage has been having which is blue-green in color. He also has had an odor that again was somewhat consistent with Pseudomonas as well. Long story short it appears that the culture showed an intermediate finding with regard to how  well the Cipro will work for the Pseudomonas infection. Subsequently being that he does not seem to be clearing up and at best what we are doing is just keeping this at [place] I think he may need to see infectious disease to discuss IV antibiotic options. MARKELL, SCIASCIA ([id number]) 07/05/2019 upon evaluation today patient appears to be doing okay in regard to his leg ulcer. He has been tolerating the dressing changes at this point without complication. Fortunately there is no signs of active infection at this time which is good news. No fevers, chills, nausea, vomiting, or diarrhea. With that being said he does have an appointment with infectious disease tomorrow and his primary care on Wednesday. Again the reason for the infectious disease referral was due to the fact that he did not seem to be fully resolving with the use of oral antibiotics and therefore we were thinking that IV antibiotic therapy may be necessary secondary to the fact that there was an intermediate finding for how effective the Cipro may be. Nonetheless again he has been having a lot of purulent and even green drainage. Fortunately right now that seems to have calmed down over the past week with the reinitiation of the oral antibiotic. Nonetheless we will see  what Dr. Megan Salon has to say. 07/12/2019 upon evaluation today patient appears to be doing about the same at this point in regard to his left lower extremity ulcer. Fortunately there is no signs of active infection at this time which is good news I do believe the Levaquin has been beneficial I did review Dr. Hale Bogus note and to be honest I agree that the patient's leg does appear to be doing better currently. What we found in the past as he does not seem to really completely resolve he will stop the antibiotic and then subsequently things will revert back to having issues with blue-green drainage, increased pain, and overall worsening in general. Obviously that is the reason I sent him back to infectious disease. 07/19/2019 upon evaluation today patient appears to be doing roughly the same in size there is really no dramatic improvement. He has started back on the Levaquin at this point and though he seems to be doing okay he did still have a lot of blue/green drainage noted on evaluation today unfortunately. I think that this is still indicative more likely of a Pseudomonas infection as previously noted and again he does see Dr. Megan Salon in just a couple of days. I do not know that were really able to effectively clear this with just oral antibiotics alone based on what I am seeing currently. Nonetheless we are still continue to try to manage as best we can with regard to the patient and his wound. I do think the wrap was helpful in decreasing the edema which is excellent news. No fevers, chills, nausea, vomiting, or diarrhea. 07/26/2019 upon evaluation today patient appears to be doing slightly better with regard to the overall appearance of the muscle there is no dark discoloration centrally. Fortunately there is no signs of active infection at this time. No fevers, chills, nausea, vomiting, or diarrhea. Patient's wound bed currently the patient did have an appointment with Dr. Megan Salon at infectious  disease last week. With that being said Dr. Megan Salon the patient states was still somewhat hesitant about put him on any IV antibiotics he wanted [place] to repeat cultures today and then see where things go going forward. He does look like Dr. Megan Salon because of some improvement the patient  did have with the Levaquin wanted [place] to see about repeating cultures. If it indeed grows the Pseudomonas again then he recommended a possibility of considering a PICC line placement and IV antibiotic therapy. He plans to see the patient back in 1 to 2 weeks. 08/02/2019 upon evaluation today patient appears to be doing poorly with regard to his left lower extremity. We did get the results of his culture back it shows that he is still showing evidence of Pseudomonas which is consistent with the purulent/blue-green drainage that he has currently. Subsequently the culture also shows that he now is showing resistance to the oral fluoroquinolones which is unfortunate as that was really the only thing to treat the infection prior. I do believe that he is looking like this is going require IV antibiotic therapy to get this under control. Fortunately there is no signs of systemic infection at this time which is good news. The patient does see Dr. Megan Salon tomorrow. 08/09/2019 upon evaluation today patient appears to be doing better with regard to his left lower extremity ulcer in regard to the overall appearance. He is currently on IV antibiotic therapy. As ordered by Dr. Megan Salon. Currently the patient is on ceftazidime which she is going to take for the next 2 weeks and then follow-up for 4 to 5-week appointment with Dr. Megan Salon. The patient started this this past Friday symptoms have not for a total of 3 days currently in full. 08/16/2019 upon evaluation today patient's wound actually does show muscle in the base of the wound but in general does appear to be much better as far as the overall evidence of infection is  concerned. In fact I feel like this is for the most part cleared up he still on the IV antibiotics he has not completed the full course yet but I think he is doing much better which is excellent news. 08/23/2019 upon evaluation today patient appears to be doing about the same with regard to his wound at this point. He tells me that he still has pain unfortunately. Fortunately there is no evidence of systemic infection at this time which is great news. There is significant muscle protrusion. 09/13/19 upon evaluation today patient appears to be doing about the same in regard to his leg unfortunately. He still has a lot of drainage coming from the ulceration there is still muscle exposed. With that being said the patient's last wound culture still showed an intermediate finding with regard to the Pseudomonas he still having the bluish/green drainage as well. Overall I do not know that the wound has completely cleared of infection at this point. Fortunately there is no signs of active infection systemically at this point which is good news. 09/20/2019 upon evaluation today patient's wound actually appears to be doing about the same based on what I am seeing currently. I do not see any signs of systemic infection he still does have evidence of some local infection and drainage. He did see Dr. Megan Salon last week and Dr. Megan Salon states that he probably does need a different IV antibiotic although he does not want to put him on this until the patient begins the Remicade infusion which is actually scheduled for about 10 days out from today on 13 May. Following that time Dr. Megan Salon is good to see him back and then will evaluate the feasibility of starting him on the IV antibiotic therapy once again at that point. I do not disagree with this plan I do believe as Dr. Megan Salon  stated in his note that I reviewed today that the patient's issue is multifactorial with the pyoderma being 1 aspect of this that were hoping  the Remicade will be helpful for her. In the meantime I think the gentamicin is, helping to keep things under decent okay control in regard to the ulcer. 09/27/2019 upon evaluation today patient appears to be doing about the same with regard to his wound still there is a lot of muscle exposure though he does have some hyper granulation tissue noted around the edge and actually some granulation tissue starting to form over the muscle which is actually good news. Fortunately there is no evidence of active infection which is also good news. His pain is less at this point. 5/21; this is a patient I have not seen in a long time. 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 He has a large wound on the left lateral leg with protruding muscle. He comes in the clinic today showing the same area on his left medial ankle. He says there is been a spot there for some time although we have not previously defined this. Today he has a clearly defined area with slight amount of skin breakdown surrounded by raised areas with a purplish hue in color. This is not painful he says it is irritated. 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 He still has muscle protruding from the lateral portion of his left leg but fortunately the new area that they were concerned about at his last visit does not appear to have opened at this point. He is currently on Remicade infusions and seems to be doing better in my opinion in fact the wound itself seems to be overall much better. The purplish discoloration that he did have seems to have resolved and I think that is a good sign that hopefully the Remicade is doing its job. He does have some biofilm noted over the surface of the wound. 11/01/2019 on evaluation today patient's wound actually appears to be doing excellent at this time. Fortunately  there is no evidence of active infection and overall I feel like he is making great progress. The Remicade seems to be due excellent job in my opinion. JAHLIL, ZILLER ([id number]) 11/08/19 evaluation today vision actually appears to be doing quite well with regard to his weight ulcer. He's been tolerating dressing changes without complication. Fortunately there is no evidence of infection. No fevers, chills, nausea, or vomiting noted at this time. Overall states that is having more itching than pain which is actually a good sign in my opinion. 12/13/2019 upon evaluation today patient appears to be doing well today with regard to his wound. He has been tolerating the dressing changes without complication. Fortunately there is no sign of active infection at this time. No fevers, chills, nausea, vomiting, or diarrhea. Overall I feel like the infusion therapy has been very beneficial for him. 01/06/2020 on evaluation today patient appears to be doing well with regard to his wound. This is measuring smaller and actually looks to be doing better. Fortunately there is no signs of active infection at this point. No fevers, chills, nausea, vomiting, or diarrhea. With that being said he does still have the blue-green drainage but this does not seem to be causing any significant issues currently. He has been using the gentamicin that does seem to be keeping things under decent control at this  point. He goes later this morning for his next infusion therapy for the pyoderma which seems to also be very beneficial. 02/07/2020 on evaluation today patient appears to be doing about the same in regard to his wounds currently. Fortunately there is no signs of active infection systemically he does still have evidence of local infection still using gentamicin. He also is showing some signs of improvement albeit slowly I do feel like we are making some progress here. 02/21/2020 upon evaluation today patient appears to be making  some signs of improvement the wound is measuring a little bit smaller which is great news and overall I am very pleased with where he stands currently. He is going to be having infusion therapy treatment on the 15th of this month. Fortunately there is no signs of active infection at this time. 03/13/2020 I do believe patient's wound is actually showing some signs of improvement here which is great news. He has continue with the infusion therapy through rheumatology[facility] That does seem to be beneficial. I still think he gets as much benefit from this as he did from the prednisone initially but nonetheless obviously this is less harsh on his body that the prednisone as far as they are concerned. 03/31/2020 on evaluation today patient's wound actually showing signs of some pretty good improvement in regard to the overall appearance of the wound bed. There is still muscle exposed though he does have some epithelial growth around the edges of the wound. Fortunately there is no signs of active infection at this time. No fevers, chills, nausea, vomiting, or diarrhea. 04/24/2020 upon evaluation today patient appears to be doing about the same in regard to his leg ulcer. He has been tolerating the dressing changes without complication. Fortunately there is no signs of active infection at this time. No fevers, chills, nausea, vomiting, or diarrhea. With that being said he still has a lot of irritation from the bandaging around the edges of the wound. We did discuss today the possibility of a referral to plastic surgery. 05/22/2020 on evaluation today patient appears to be doing well with regard to his wounds all things considered. He has not been able to get the Chantix apparently there is a recall nurse that I was unaware of put out by [employer] involuntarily. Nonetheless for now I am and I have to do some research into what may be the best option for him to help with quitting in regard to smoking and  we discussed that today. 06/26/2020 upon evaluation today patient appears to be doing well with regard to his wound from the standpoint of infection I do not see any signs of infection at this point. With that being said unfortunately he is still continuing to have issues with muscle exposure and again he is not having a whole lot of new skin growth unfortunately. There does not appear to be any signs of active infection at this time. No fevers, chills, nausea, vomiting, or diarrhea. 07/10/2020 upon evaluation today patient appears to be doing a little bit more poorly currently compared to where he was previous. I am concerned currently about an active infection that may be getting worse especially in light of the increased size and tenderness of the wound bed. No fevers, chills, nausea, vomiting, or diarrhea. 07/24/2020 upon evaluation today patient appears to be doing poorly in regard to his leg ulcer. He has been tolerating the dressing changes without complication but unfortunately is having a lot of discomfort. Unfortunately the patient has  an infection with Pseudomonas resistant to gentamicin as well as fluoroquinolones. Subsequently I think he is going require possibly IV antibiotics to get this under control. I am very concerned about the severity of his infection and the amount of discomfort he is having. 07/31/2020 upon evaluation today patient appears to be doing about the same in regard to his leg wound. He did see Dr. Megan Salon and Dr. Megan Salon is actually going to start him on IV antibiotics. He goes for the PICC line tomorrow. With that being said there do not have that run for 2 weeks and then see how things are doing and depending on how he is progressing they may extend that a little longer. Nonetheless I am glad this is getting ready to be in place and definitely feel it may help the patient. In the meantime is been using mainly triamcinolone to the wound bed has an  anti-inflammatory. 08/07/2020 on evaluation today patient appears to be doing well with regard to his wound compared even last week. In the interim he has gotten the PICC line placed and overall this seems to be doing excellent. There does not appear to be any evidence of infection which is great news systemically although locally of course has had the infection this appears to be improving with the use of the antibiotics. 08/14/2020 upon evaluation today patient's wound actually showing signs of excellent improvement. Overall the irritation has significantly improved the drainage is back down to more of a normal level and his pain is really pretty much nonexistent compared to what it was. Obviously I think that this is significantly improved secondary to the IV antibiotic therapy which has made all the difference in the world. Again he had a resistant form of Pseudomonas for which oral antibiotics just was not cutting it. Nonetheless I do think that still we need to consider the possibility of a surgical closure for this wound is been open so long and to be honest with muscle exposed I think this can be very hard to get this to close outside of this although definitely were still working to try to do what we can in that regard. 08/21/2020 upon evaluation today patient appears to be doing very well with regard to his wounds on the left lateral lower extremity/calf area. Fortunately there does not appear to be signs of active infection which is great news and overall very pleased with where things stand today. He is actually wrapping up his treatment with IV antibiotics tomorrow. After that we will see where things go from there. 08/28/2020 upon evaluation today patient appears to be doing decently well with regard to his leg ulcer. There does not appear to be any signs of active infection which is great news and overall very pleased with where things stand today. No fevers, chills, nausea, vomiting, or  diarrhea. 09/18/2020 upon evaluation today patient appears to be doing well with regard to his infection which I feel like is better. Unfortunately he is not doing as well with regard to the overall size of the wound which is not nearly as good at this point. I feel like that he may be having an issue here with the pyoderma being somewhat out of control. I think that he may benefit from potentially going back and talking to the dermatologist about LATTIE, CERVI ([id number]) what to do from the pyoderma standpoint. I am not certain if the infusions are helping nearly as much is what the prednisone did in the past. 10/02/2020 upon  evaluation today patient appears to be doing well with regard to his leg ulcer. He did go to the [facility]iatric nurse. Unfortunately they feel like there is a 10% chance that most that he would be able to heal and that the skin graft would take. Obviously this has led him to not be able to go down that path as far as treatment is concerned. Nonetheless he does seem to be doing a little bit better with the prednisone that I gave him last time. I think that he may need to discuss with dermatology the possibility of long-term prednisone as that seems to be what is most helpful for him to be perfectly honest. I am not sure the Remicade is really doing the job. 10/17/2020 upon evaluation today patient appears to be doing a little better in regard to his wound. In fact the case has been since we did the prednisone on May 2 for him that we have noticed a little bit of improvement each time we have seen a size wise as well as appearance wise as well as pain wise. I think the prednisone has had a greater effect then the infusion therapy has to be perfectly honest. With that being said the patient also feels significantly better compared to what he was previous. All of this is good news but nonetheless I am still concerned about the fact that again we are really not set up to long-term manage him  as far as prednisone is concerned. Obviously there are things that you need to be watched I completely understand the risk of prednisone usage as well. 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 With all that being said I do believe that we can give him another round of the prednisone which she is requesting today because of the improvement that he seen since we did that first round. 10/30/2020 upon evaluation today patient's wound actually is showing signs of doing quite well. There does not appear to be any evidence of infection which is great news and overall very pleased with where things stand today. No fevers, chills, nausea, vomiting, or diarrhea. He tells me that the prednisone still has seem to have helped he wonders if we can extend that for just a little bit longer. He did not have the appointment with a dermatologist although he did have an infusion appointment last Friday. That was at [facility] With that being said he tells me he could not do both that as well as the appointment with the physician on the same day therefore that is can have to be rescheduled. I really want to see if there is anything they feel like that could be done differently to try to help this out as I am not really certain that the infusions are helping significantly here. 11/13/2020 upon evaluation today patient unfortunately appears to be doing somewhat poorly in regard to his wound I feel like this is actually worsening from the standpoint of the pyoderma spreading. I still feel like that he may need something different as far as trying to manage this going forward. Again we did the prednisone unfortunately his blood sugars are not doing so well because of this. Nonetheless I believe that the patient likely needs to try topical steroid. We have done triamcinolone for a while I think going with something stronger such as clobetasol could be beneficial again this is not  something I do lightly I discussed this with the  patient that again this does not normally put underneath an occlusive dressing. Nonetheless I think a thin film as such could help with some of the stronger anti-inflammatory effects. We discussed this today. He would like to try to give this a trial for the next couple weeks. I definitely think that is something that we can do. Evaluate7/03/2021 and today patient's wound bed actually showed signs of doing really about the same. There was a little expansion of the size of the wound and that leading edge that we done looking out although the clobetasol does seem to have slowed this down a bit in my opinion. There is just 1 small area that still seems to be progressing based on what I see. Nonetheless I am concerned about the fact this does not seem to be improving if anything seems to be doing a little bit worse. I do not know that the infusions are really helping him much as next infusion is August 5 his appointment with dermatology is July 25. Either way I really think that we need to have a conversation potentially about this and I am actually going to see if I can talk with Dr. Lillia Carmel in order to see where things stand as well. 12/11/2020 upon evaluation today patient appears to be doing worse in regard to his leg ulcer. Unfortunately I just do not think this is making the progress that I would like to see at this point. Honestly he does have an appointment with dermatology and this is in 2 days. I am wondering what they may have to offer to help with this. Right now what I am seeing is that he is continuing to show signs of worsening little by little. Obviously that is not great at all. Is the exact opposite of what we are looking for. 12/18/2020 upon evaluation today patient appears to be doing a little better in regard to his wound. The dermatologist actually did do some steroid injections into the wound which does seem to have been beneficial in  my opinion. That was on the 25th already this looks a little better to me than last time I saw him. With that being said we did do a culture and this did show that he has Staph aureus noted in abundance in the wound. With that being said I do think that getting him on an oral antibiotic would be appropriate as well. Also think we can compression wrap and this will make a difference as well. 12/28/2020 upon evaluation today patient's wound is actually showing signs of doing much better. I do believe the compression wrap is helping he has a lot of drainage but to be honest I think that the compression is helping to some degree in this regard as well as not draining through which is also good news. No fevers, chills, nausea, vomiting, or diarrhea. 01/04/2021 upon evaluation today patient appears to be doing well with regard to his wound. Overall things seem to be doing quite well. He did have a little bit of reaction to the CarboFlex Sorbact he will be using that any longer. With that being said he is controlled as far as the drainage is concerned overall and seems to be doing quite well. I do not see any signs of active infection at this time which is great news. No fevers, chills, nausea, vomiting, or diarrhea. 01/11/2021 upon evaluation today patient appears to be doing well with regard to his wounds. He has been tolerating the dressing changes without complication.  Fortunately there does not appear to be any signs of active infection at this time which is great news. Overall I am extremely pleased with where we stand currently. No fevers, chills, nausea, vomiting, or diarrhea. Where using clobetasol in the wound bed he has a lot of new skin growth which is awesome as well. 01/18/2021 upon evaluation today patient appears to be doing very well in regard to his leg ulcer. He has been tolerating the dressing changes without complication. Fortunately there does not appear to be any signs of active infection  which is great news. In general I think that he is making excellent progress 01/25/2021 upon evaluation today patient appears to be doing well with regard to his wound on the leg. I am actually extremely pleased with where things stand today. There does not appear to be any signs of active infection which is great news and overall I think that we are definitely headed in the appropriate direction based on what I am seeing currently. There does not appear to be any signs of active infection also excellent news. 02/06/2021 upon evaluation today patient appears to be doing well with regard to his wound. Overall visually this is showing signs of significant improvement which is great news. I do not see any signs of active infection systemically which is great even locally I do not think that we are seeing any major complications here. We did do fluorescence imaging with the [employer] today. The patient does have some odor and drainage noted and again this is something that I think would benefit him to probably come more frequently for nurse visits. 02/19/2021 upon evaluation today patient actually appears to be doing quite well in regard to his wound. He has been tolerating the dressing Dobbins, Tomer ([id number]) changes without complication and overall I think that this is making excellent progress. I do not see any evidence of active infection at this point which is great news as well. No fevers, chills, nausea, vomiting, or diarrhea. 10/10; wound is made nice progress healthy granulation with a nice rim of epithelialization which seems to be expanding even from last week he has a deeper area in the inferior part of the more distal part of the wound with not quite as healthy as surface. This area will need to be followed. Using clobetasol and Hydrofera Blue 03/05/2021 upon evaluation today patient appears to be doing very well in regard to his leg ulcer. He has been tolerating dressing changes without  complication. Fortunately there does not appear to be any signs of active infection which is great news and overall I am extremely pleased with where we stand currently. 03/12/2021 upon evaluation today patient appears to be doing well with regard to his wound in fact this is extremely extremely good based on what we are seeing today there does not appear to be any signs of active infection and overall I think that he is doing awesome from the standpoint of healing in general. I am extremely pleased with how things seem to be progressing with regard to this pyoderma. Clobetasol has done wonders for him. I think the compression wrapping has also been of great benefit. 03/19/2021 upon evaluation today patient appears to be doing well with regard to his wound. He is tolerating the dressing changes without complication. In fact I feel like that he is actually making excellent progress at this point based on what I am seeing. No fevers, chills, nausea, vomiting, or diarrhea. 03/26/2021 upon evaluation today patient  appears to be doing well with regard to his wound. This again is measuring smaller and looking better. Again the progress is slow but nonetheless continual with what we have been seeing. I do believe that the current plan is doing awesome for him. 04/09/2021 upon evaluation today patient appears to be doing well with regard to his leg ulcer. This is showing signs of excellent improvement the muscle is completely closed over and there does not appear to be any evidence of inflammation at this point his drainage is significantly improved. Overall I think that he would be a good candidate for looking into a skin substitute at this point as well. We will get a look into some approvals in that regard. Potentially TheraSkin as well as Apligraf could both be considered just depending on insurance coverage. 04/16/2021 upon evaluation today patient appears to be doing well at this point. He has been  approved for the Apligraf which we could definitely order although I would like to try to get the TheraSkin approved if at all possible. I did fax notes into them today and I Georgina Peer try to give a call as well. Overall the wound appears to be doing decently well today. 04/20/2021 upon evaluation today patient actually appears to be doing quite well in regard to his wound with that being said we are trying to see what we can do about speeding up the healing process. For that reason we did discuss the possibility of a skin substitute. We got the Apligraf approved. For that reason we will get a go ahead and see what we can do with the Apligraf at this point. I am still trying to get the TheraSkin approved but I have not heard anything from the insurance company yet I have called and talked to them earlier in the week and to be honest this was about half an hour that I spent on the phone they told me I would hear something within 10-15 business days. 04/27/2021 upon evaluation today patient appears to be doing excellent in regard to his wound. I do believe the Apligraf has been beneficial. With that being said he has had a little bit of increased pain has been a little bit concerned. I do want to go ahead and apply his steroid cream today the clobetasol and then put the Apligraf over I just do not think I want to risk not putting the clobetasol on based on what is been going on here currently. Patient voiced understanding. 05/04/2021 upon evaluation today patient is making excellent improvement. Overall there is definitely decrease in the size of the wound today and I am very pleased in that regard. I do not see any signs of active infection locally nor systemically at this point. No fevers, chills, nausea, vomiting, or diarrhea. 05/11/2021 upon evaluation today patient appears to be doing well with regard to his wound. He has been tolerating Apligraf's without complication and is making excellent progress  this is application #4 today. 12/30; patient here for Apligraf application. Appears to be doing well small wound there has been a lot of healing 05/28/2021 upon evaluation today patient appears to be doing well with regard to his wound. Has been tolerating the dressing changes without complication. Fortunately I do not see any signs of active infection locally nor systemically at this point which is great news. He is done with the Apligraf and the first round and overall this has filled in quite nicely I am not even certain he needs any  additional at the moment. I would actually try to go back to clobetasol with [facility] which previously was doing well for him. 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 He is using clobetasol and Hydrofera Blue. Surface area of the wound is smaller and the surface looks very healthy. 06/11/2021 upon evaluation today patient actually appears to be showing signs of good improvement which is great news. Fortunately I do not see any signs of active infection at this time which is also excellent news. I do believe that the patient is doing well with the clobetasol and Hydrofera Blue. He does have some irritation and itching around the rest of the leg will use some triamcinolone over this area. 06/18/2021 upon evaluation today patient appears to be doing well with regard to his wound. He is showing signs of excellent improvement and overall I am extremely pleased with where we stand today. We are moving in the right direction. 06/25/2021 upon evaluation today patient appears to be doing well with regard to his wound. In fact this is doing also not showing signs of excellent improvement overall very pleased with where we stand today. I do not see any evidence of active infection locally or systemically at this time which is also great news. 07/02/2021 upon evaluation patient's wound bed actually showed signs of good  granulation and epithelization at this point. And this is measuring significantly smaller and overall I am extremely pleased with where we stand today. No fevers, chills, nausea, vomiting, or diarrhea. 07/09/2021 upon evaluation patient's wound bed actually showed signs of good granulation and epithelization at this point. Fortunately there does not appear to be any evidence of active infection locally or systemically which is great news and overall I am extremely pleased with where we stand at this point. 07/16/2021 upon evaluation today patient appears to be doing well with regard to his wound all things considered although it is maybe a little bit larger than what its been. This does have me a little concerned. Actually question about whether anything is changed and initially he told me ZAIM, NITTA ([id number]) know. In fact it was not until the end of the visit that he actually did mention that he had missed his infusion in January. This very well may be what is because the difference is also seeing currently. That definitely has seem to been helping more recently. 07/23/2021 upon evaluation today patient's wound is yet again larger despite the switch to a collagen which was not beneficial. Subsequently I am going to at this point check on a couple things. First and foremost I do want to go ahead and do a culture. I think that there is a chance he could have a low-lying infection that may be part of the issue here. Subsequently I am also probably can go ahead and place him on Bactrim DS which I think is a good option and has done well with them in the past. Again if this is an infection that should hopefully help to turn things around. With all that being said I do believe that the patient should hopefully be able to turn this around with reinitiation of the infusion therapy which will be going on this week and subsequently getting on the antibiotic. 3/13; patient presents for follow-up. He has no  issues or complaints today. He states he is currently on oral antibiotics for previous culture done in the office. 08/06/2021 upon evaluation today patient appears to be doing  well with regard to his wound. I am actually seeing signs of improvement which is great news. I think that he is on a better track he did receive his infusion as well which is great news and overall I think that this should hopefully get him back on track as far as healing is concerned. He is not having any pain and I do not see any signs of inflammation which is great news. 08/13/2021 upon evaluation today patient appears to be doing excellent in regard to his wound on the leg. He has been tolerating the dressing changes without complication. Fortunately I do not see any signs of infection I do believe he is making good progress here and I think her back on track overall the tissue is greatly improved compared to what we have been. 08/20/2021 upon evaluation today patient appears to be doing excellent in regard to his wound. He has been tolerating the dressing changes without complication. Fortunately I do not see any evidence of infection at this time locally or systemically which is great news and overall very pleased with where we stand. 08-31-2021 upon evaluation today patient appears to be doing excellent in regard to his wound. Overall even with the vacation and extra walking he seems to have really done well. Fortunately I do not see any evidence of active infection locally or systemically which is great news and overall I think we are headed in the right direction. 09-10-2021 upon evaluation today patient's wound is showing signs of improvement. This is a small improvement but nonetheless we seem to be making progress here. I am very pleased in that regard. There does not appear to be any signs of infection and overall things are doing quite nicely. Upon inspection patient's wound bed actually showed signs of good  granulation and epithelization at this point. There was some irritation around the edges of the wound where the good skin was it almost appears that something was rubbing or else is was due from drainage and irritation. Nonetheless I am going to see about putting a little bit of zinc just around the wound opening in order to protect the skin from being damaged. The patient is in agreement with that plan. 5/8; its been a while since I have seen this wound and it looks considerably better although still with some depth. Per our intake nurse the dimensions have improved patient has been using a thin layer of clobetasol, Hydrofera Blue under 4-layer compression. He appears to be making progress 10-01-2021 upon evaluation today patient appears to be doing well with regard to his wound although it is somewhat stalled I feel like were basically at a standstill here. He has previously had Apligraf which did very well for him to be honest. I do believe that he may benefit from a repeat of the Apligraf. In fact it got so small at that point but I do not think we even used all of the applications that were recommended as this had improved to such a degree. Nonetheless I do believe that the patient currently could benefit from repeat treatment with the Apligraf due to the improvement this that we did see. He is not opposed to this and subsequently working to look into getting approval to reinstitute that treatment. 10-08-2021 upon evaluation today patient appears to be doing well with regard to his wound. Overall I feel like he is doing a little bit better from the standpoint of irritation at this time which is great news. I do  not see any signs of active infection locally or systemically which is great news as well. No fevers, chills, nausea, vomiting, or diarrhea. 10-12-2021 upon evaluation today patient appears to be doing well with regard to his wound stable although we are can initiate treatment with Apligraf  today. I am hoping this will stimulate things to improve. Patient is in agreement with that plan. 10-19-2028 upon evaluation today patient appears to be doing well with regard to his wound. He has been tolerating the dressing changes without complication. Fortunately there does not appear to be any evidence of active infection locally or systemically at this time which is great news. No fevers, chills, nausea, vomiting, or diarrhea. I do believe the Apligraf has improved the overall appearance of the wound bed receiving some definite improvements here. 10-26-2021 upon evaluation today patient's wound is actually shown some signs of really good improvement I am very pleased in this regard. There is a little bit of slough and biofilm buildup that is stuck to the surface of the wound I am getting very lightly debrided in order to clear this away and then subsequently will get a continue with the Apligraf application today. This is #3. 11-09-2021 upon evaluation today patient appears to be doing well currently in regard to his wound. This is actually showing signs of significant improvement which is great news and overall I think the Apligraf is doing an amazing job. This wound is significantly smaller compared to the last time I saw him. This will be Apligraf application #4. [id number] upon evaluation today patient appears to be doing well currently in regard to his wound he is actually showing signs of excellent improvement which is great news and overall I am extremely pleased with where we stand today. I do not see any evidence of active infection locally or systemically at this time which is great news. No fevers, chills, nausea, vomiting, or diarrhea. I believe the Apligraf is doing an awesome job and in fact I [place] see about getting an extension of application which hopefully should not be a problem. Especially in light of how much improvement he has had with regards to the size of the wound with  treatment. This is application #5 today. 12-07-2021 upon evaluation today patient appears to be doing excellent in regard to his wound has been tolerating the dressing changes without complication. Fortunately there does not appear to be any signs of active infection locally or systemically at this time. 12-21-2021 upon evaluation today patient appears to be doing well currently in regard to his wound. We have gotten approval for a repeat Apligraf treatment. I think that is going to be a good way to wrap this up I really feel like we will likely be able to get the wound closed with the additional Vowell, Eldin ([id number]) treatments that have been improved. 01-04-2022 upon evaluation patient appears to be doing excellent in regard to his wound. He is tolerating the dressing changes without complication and is measuring much better today as well. I am very pleased with where we stand. I am not even certain we will get have to use all 5 of the reapproved Apligraf but we will see how things go right now I am extremely pleased with the progress that is been made. 01-18-2022 upon evaluation today patient appears to be doing well with regard to his wound is actually measuring significantly better which is great news. This is still open but very minimally. I do think that it is appropriate  to continue with the Apligraf he has done so well with this. Electronic Signature(s) Signed: 01/18/2022 8:47:48 AM By: Worthy Keeler PA-C Entered By: Worthy Keeler on 01/18/2022 08:47:47 ABDELAZIZ, WESTENBERGER ([id number]) -------------------------------------------------------------------------------- Physical Exam Details Patient Name: [name] Date of Service: 01/18/2022 8:15 AM Medical Record Number: [id number] Patient Account Number: [id number] Date of Birth/Sex: Jan 13, 1981 (40 y.o. M) Treating RN: [name] Primary Care Provider: [name] Other Clinician: Massie Kluver Referring Provider: [name] Treating Provider/Extender: [name] Cliche in Treatment: 39 Constitutional Well-nourished and well-hydrated in no acute distress. Respiratory normal breathing without difficulty. Psychiatric this patient is able to make decisions and demonstrates good insight into disease process. Alert and Oriented x 3. pleasant and cooperative. Notes Upon inspection patient's wound bed actually showed signs of good granulation and epithelization at this point. Fortunately I do not see any evidence of infection locally or systemically which is great news and overall I am extremely pleased with where we stand today. Electronic Signature(s) Signed: 01/18/2022 8:48:01 AM By: Worthy Keeler PA-C Entered By: Worthy Keeler on 01/18/2022 08:48:01 DMITRY, MACOMBER ([id number]) -------------------------------------------------------------------------------- Physician Orders Details Patient Name: [name] Date of Service: 01/18/2022 8:15 AM Medical Record Number: [id number] Patient Account Number: [id number] Date of Birth/Sex: March 12, 1981 (40 y.o. M) Treating RN: [name] Primary Care Provider: [name] Other Clinician: Massie Kluver Referring Provider: [name] Treating Provider/Extender: [name] Cliche in Treatment: 267 Verbal / Phone Orders: No Diagnosis Coding ICD-10 Coding Code Description I87.2 Venous insufficiency (chronic) (peripheral) L97.222 Non-pressure chronic ulcer of left calf with fat layer exposed E11.622 Type 2 diabetes mellitus with other skin ulcer L88 Pyoderma gangrenosum F17.208 Nicotine dependence, unspecified, with other nicotine-induced disorders Follow-up Appointments o Return Appointment in 1 week. o Nurse Visit as needed - twice a week Bathing/ Shower/ Hygiene o Clean wound with Normal Saline or wound cleanser. o May shower with wound dressing protected with water repellent cover or cast protector. o No tub bath. Cellular or  Tissue Based Products Wound #1 Left,Lateral Lower Leg o Cellular or Tissue Based Product applied to wound bed; including contact layer, fixation with steri-strips, dry gauze and cover dressing. (DO NOT REMOVE). - Apligraf #8 applied Edema Control - Lymphedema / Segmental Compressive Device / Other o Optional: One layer of unna paste to top of compression wrap (to act as an anchor). - He needs this to hold it up o Elevate, Exercise Daily and Avoid Standing for Long Periods of Time. o Elevate legs to the level of the heart and pump ankles as often as possible o Elevate leg(s) parallel to the floor when sitting. Wound Treatment Wound #1 - Lower Leg Wound Laterality: Left, Lateral Cleanser: Wound Cleanser 3 x Per Week/30 Days Discharge Instructions: Wash your hands with soap and water. Remove old dressing, discard into plastic bag and place into trash. Cleanse the wound with Wound Cleanser prior to applying a clean dressing using gauze sponges, not tissues or cotton balls. Do not scrub or use excessive force. Pat dry using gauze sponges, not tissue or cotton balls. Peri-Wound Care: Desitin Maximum Strength Ointment 4 (oz) 3 x Per Week/30 Days Discharge Instructions: Apply around wound edges to avoid maceration Peri-Wound Care: Triamcinolone Acetonide Cream, 0.1%, 15 (g) tube 3 x Per Week/30 Days Discharge Instructions: Apply to leg Topical: Clobetasol Propionate ointment 0.05%, 60 (g) tube 3 x Per Week/30 Days Discharge Instructions: Pt to bring to appt- applied to wound bed Primary Dressing: Apligraf 3 x Per Week/30 Days Secondary  Dressing: (NON-BORDER) Zetuvit Plus Silicone NON-BORDER 5x5 (in/in) 3 x Per Week/30 Days Compression Wrap: Medichoice 4 layer Compression System, 35-40 mmHG 3 x Per Week/30 Days Discharge Instructions: Apply multi-layer wrap as directed. JOSTIN, RUE ([id number]) Electronic Signature(s) Signed: 01/18/2022 12:54:01 PM By: [name], [name], RN, [name], [name],  [name] Signed: 01/18/2022 1:57:25 PM By: Worthy Keeler PA-C Entered By: [name] [name], RN, [name], [name] on 01/18/2022 08:42:17 CORTLAND, CREHAN ([id number]) -------------------------------------------------------------------------------- Problem List Details Patient Name: [name] Date of Service: 01/18/2022 8:15 AM Medical Record Number: [id number] Patient Account Number: [id number] Date of Birth/Sex: 09-07-1980 (40 y.o. M) Treating RN: [name] Primary Care Provider: [name] Other Clinician: Massie Kluver Referring Provider: [name] Treating Provider/Extender: [name] Cliche in Treatment: 267 Active Problems ICD-10 Encounter Code Description Active Date MDM Diagnosis I87.2 Venous insufficiency (chronic) (peripheral) 12/04/2016 No Yes L97.222 Non-pressure chronic ulcer of left calf with fat layer exposed 12/04/2016 No Yes E11.622 Type 2 diabetes mellitus with other skin ulcer 04/09/2021 No Yes L88 Pyoderma gangrenosum 03/26/2017 No Yes F17.208 Nicotine dependence, unspecified, with other nicotine-induced disorders 04/24/2020 No Yes Inactive Problems ICD-10 Code Description Active Date Inactive Date L97.213 Non-pressure chronic ulcer of right calf with necrosis of muscle 04/02/2017 04/02/2017 Resolved Problems ICD-10 Code Description Active Date Resolved Date L97.321 Non-pressure chronic ulcer of left ankle limited to breakdown of skin 10/08/2019 10/08/2019 L03.116 Cellulitis of left lower limb 05/24/2019 05/24/2019 Electronic Signature(s) Signed: 01/18/2022 8:08:37 AM By: Worthy Keeler PA-C Entered By: Worthy Keeler on 01/18/2022 08:08:36 [name] ([id number]) -------------------------------------------------------------------------------- Progress Note Details Patient Name: [name] Date of Service: 01/18/2022 8:15 AM Medical Record Number: [id number] Patient Account Number: [id number] Date of Birth/Sex: 11/21/1980 (40 y.o. M) Treating RN: [name] Primary Care  Provider: [name] Other Clinician: Massie Kluver Referring Provider: [name] Treating Provider/Extender: [name] Cliche in Treatment: 267 Subjective Chief Complaint Information obtained from Patient He is here in follow up evaluation for LLE pyoderma ulcer History of Present Illness (HPI) 12/04/16; 41 year old man who comes into the clinic today for review of a wound on the posterior left calf. He tells me that is been there for about a year. He is not a diabetic he does smoke half a pack per day. He was seen in the ER on 11/20/16 felt to have cellulitis around the wound and was given clindamycin. An x-ray did not show osteomyelitis. The patient initially tells me that he has a milk allergy that sets off a pruritic itching rash on his lower legs which she scratches incessantly and he thinks that's what may have set up the wound. He has been using various topical antibiotics and ointments without any effect. He works in a [employer]ing [employer] and is on his feet all day. He does not have a prior history of wounds however he does have the rash on both lower legs the right arm and the ventral aspect of his left arm. These are excoriations and clearly have had scratching however there are of macular looking areas on both legs including a substantial larger area on the right leg. This does not have an underlying open area. There is no blistering. The patient tells me that 2 years ago in [place] in response to the rash on his legs he saw a dermatologist who told him he had a condition which may be pyoderma gangrenosum although I may be putting words into his mouth. He seemed to recognize this. On further questioning he admits to a 5 year history of  quiesced. ulcerative colitis. He is not in any treatment for this. He's had no recent travel 12/11/16; the patient arrives today with his wound and roughly the same condition we've been using silver alginate this is a deep punched out wound with  some surrounding erythema but no tenderness. Biopsy I did did not show confirmed pyoderma gangrenosum suggested nonspecific inflammation and vasculitis but does not provide an actual description of what was seen by the pathologist. I'm really not able to understand this We have also received information from the patient's dermatologist in [place] notes from April 2016. This was a doctor Agarwal-antal. 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 He was prescribed topical steroid high potency under occlusion which helped but at this point the patient did not have a deep punched out wound. 12/18/16; the patient's wound is larger in terms of surface area however this surface looks better and there is less depth. The surrounding erythema also is better. The patient states that the wrap we put on came off 2 days ago when he has been using his compression stockings. He we are in the process of getting a dermatology consult. 12/26/16 on evaluation today patient's left lower extremity wound shows evidence of infection with surrounding erythema noted. He has been tolerating the dressing changes but states that he has noted more discomfort. There is a larger area of erythema surrounding the wound. No fevers, chills, nausea, or vomiting noted at this time. With that being said the wound still does have slough covering the surface. He is not allergic to any medication that he is aware of at this point. In regard to his right lower extremity he had several regions that are erythematous and pruritic he wonders if there's anything we can do to help that. 01/02/17 I reviewed patient's wound culture which was obtained his visit last week. He was placed on doxycycline at that point. Unfortunately that does not appear to be an antibiotic that would likely help with the situation however the pseudomonas noted on culture is sensitive to Cipro. Also unfortunately patient's wound seems to have a large compared to last  week's evaluation. Not severely so but there are definitely increased measurements in general. He is continuing to have discomfort as well he writes this to be a seven out of 10. In fact he would prefer me not to perform any debridement today due to the fact that he is having discomfort and considering he has an active infection on the little reluctant to do so anyway. No fevers, chills, nausea, or vomiting noted at this time. 01/08/17; patient seems dermatology on September 5. I suspect dermatology will want the slides from the biopsy I did sent to their pathologist. I'm not sure if there is a way we can expedite that. In any case the culture I did before I left on vacation 3 weeks ago showed Pseudomonas he was given 10 days of Cipro and per her description of her intake nurses is actually somewhat better this week although the wound is quite a bit bigger than I remember the last time I saw this. He still has 3 more days of Cipro 01/21/17; dermatology appointment tomorrow. He has completed the ciprofloxacin for Pseudomonas. Surface of the wound looks better however he is had some deterioration in the lesions on his right leg. Meantime the left lateral leg wound we will continue with sample 01/29/17; patient had his dermatology appointment but I can't yet see that note. He is completed his antibiotics. The  wound is more superficial but considerably larger in circumferential area than when he came in. This is in his left lateral calf. He also has swollen erythematous areas with superficial wounds on the right leg and small papular areas on both arms. There apparently areas in her his upper thighs and buttocks I did not look at those. Dermatology biopsied the right leg. Hopefully will have their input next week. 02/05/17; patient went back to see his dermatologist who told him that he had a "scratching problem" as well as staph. He is now on a 30 day course of doxycycline and I believe she gave him  triamcinolone cream to the right leg areas to help with the itching [not exactly sure but probably triamcinolone]. She apparently looked at the left lateral leg wound although this was not rebiopsied and I think felt to be ultimately part of the same pathogenesis. He is using sample border foam and changing nevus himself. He now has a new open area on the right posterior leg which was his biopsy site I don't have any of the dermatology notes 02/12/17; we put the patient in compression last week with SANTYL to the wound on the left leg and the biopsy. Edema is much better and the depth of the wound is now at level of skin. Area is still the same Biopsy site on the right lateral leg we've also been using santyl with a border foam dressing and he is changing this himself. 02/19/17; Using silver alginate started last week to both the substantial left leg wound and the biopsy site on the right wound. He is tolerating compression well. Has a an appointment with his primary M.D. tomorrow wondering about diuretics although I'm wondering if the edema problem is actually lymphedema ALERIC, FROELICH ([id number]) 02/26/17; the patient has been to see his primary doctor Dr. Jerrel Ivory at [place] our primary care. She started him on Lasix 20 mg and this seems to have helped with the edema. However we are not making substantial change with the left lateral calf wound and inflammation. The biopsy site on the right leg also looks stable but not really all that different. 03/12/17; the patient has been to see vein and vascular Dr. Lucky Cowboy. He has had venous reflux studies I have not reviewed these. I did get a call from his dermatology office. They felt that he might have pathergy based on their biopsy on his right leg which led them to look at the slides of the biopsy I did on the left leg and they wonder whether this represents pyoderma gangrenosum which was the original supposition in a man with ulcerative colitis  albeit inactive for many years. They therefore recommended clobetasol and tetracycline i.e. aggressive treatment for possible pyoderma gangrenosum. 03/26/17; apparently the patient just had reflux studies not an appointment with Dr. dew. She arrives in clinic today having applied clobetasol for 2-3 weeks. He notes over the last 2-3 days excessive drainage having to change the dressing 3-4 times a day and also expanding erythema. 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 Lillia Carmel at [facility] dermatology locally who kindly saw him at my request. 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 My concern was  the degree of erythema medially extending from his left leg wound which was either inflammation from pyoderma or cellulitis. I put him on Augmentin however culture of the wound showed Pseudomonas which is quinolone sensitive. I really don't believe he has cellulitis however in view of everything I will continue and give him a course of Cipro. He is also on doxycycline as an immune modulator for the pyoderma. In addition to his original wound on the left lateral leg with surrounding erythema he has a wound on the right posterior calf which was an original biopsy site done by dermatology. This was felt to represent pathergy from pyoderma gangrenosum 04/16/17; pyoderma gangrenosum. Saw Dr. Lillia Carmel yesterday. He has been using topical antibiotics to both wound areas his original wound on the left and the biopsies/pathergy area on the right. There is definitely some improvement in the inflammation around the wound on the right although the patient states he has increasing sensitivity of the  wounds. He is on prednisone 60 and doxycycline 1 as prescribed by Dr. Lillia Carmel. He is covering the topical antibiotic with gauze and putting this in his own compression stocks and changing this daily. He states that Dr. Lottie Rater did a culture of the left leg wound yesterday 05/07/17; pyoderma gangrenosum. The patient saw Dr. Lillia Carmel yesterday and has a follow-up with her in one month. He is still using topical antibiotics to both wounds although he can't recall exactly what type. He is still on prednisone 60 mg. Dr. Lillia Carmel stated that the doxycycline could stop if we were in agreement. 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 The right medial leg was induced by biopsy/pathergy. The area on the right is essentially healed. 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 The right medial leg has closed and remains closed. He is still on prednisone 60. He tells me he missed his last dermatology appointment with Dr. Lillia Carmel but will make another appointment. He reports that her blood sugar at a recent screen in [place] was high 200's. He was 180 today. 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 I think he is going to require still much longer prednisone perhaps another 3 months before attempting to taper. In the meantime his wound is a lot better. Smaller. He is cleaning this off daily and applying topical antibiotics. When he was last in the clinic I thought about changing to [facility] and actually put in a couple of calls to dermatology although probably not during their business hours. 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 He continues on oral prednisone. He  has been using triple antibiotic ointment. There is surface debris and we will transition to [facility] and have him return in 2 weeks. He has lost 30 pounds since his last appointment with lifestyle modification. He may benefit from topical steroid cream for treatment this can be considered at a later date. 08/22/17 on evaluation today patient appears to actually be doing rather well in regard to his left lateral lower extremity ulcer. He has actually been managed by Dr. Dellia Nims most recently. Patient is currently on oral steroids at this time. This seems to have been of benefit for him. Nonetheless his  last visit was actually with Leah on 08/06/17. Currently he is not utilizing any topical steroid creams although this could be of benefit as well. No fevers, chills, nausea, or vomiting noted at this time. 09/05/17 on evaluation today patient appears to be doing better in regard to his left lateral lower extremity ulcer. He has been tolerating the dressing changes without complication. He is using Santyl with good effect. Overall I'm very pleased with how things are standing at this point. Patient likewise is happy that this is doing better. 09/19/17 on evaluation today patient actually appears to be doing rather well in regard to his left lateral lower extremity ulcer. Again this is secondary to Pyoderma gangrenosum and he seems to be progressing well with the Santyl which is good news. He's not having any significant pain. 10/03/17 on evaluation today patient appears to be doing excellent in regard to his lower extremity wound on the left secondary to Pyoderma gangrenosum. He has been tolerating the Santyl without complication and in general I feel like he's making good progress. 10/17/17 on evaluation today patient appears to be doing very well in regard to his left lateral lower surety ulcer. He has been tolerating the dressing changes without complication. There does not appear to be any evidence of  infection he's alternating the Santyl and the triple antibiotic ointment every other day this seems to be doing well for him. 11/03/17 on evaluation today patient appears to be doing very well in regard to his left lateral lower extremity ulcer. He is been tolerating the dressing changes without complication which is good news. Fortunately there does not appear to be any evidence of infection which is also great news. Overall is doing excellent they are starting to taper down on the prednisone is down to 40 mg at this point it also started topical clobetasol for him. 11/17/17 on evaluation today patient appears to be doing well in regard to his left lateral lower surety ulcer. He's been tolerating the dressing changes without complication. He does note that he is having no pain, no excessive drainage or discharge, and overall he feels like things are going about how he would expect and hope they would. Overall he seems to have no evidence of infection at this time in my opinion which is good news. 12/04/17-He is seen in follow-up evaluation for right lateral lower extremity ulcer. He has been applying topical steroid cream. Today's measurement show slight increase in size. Over the next 2 weeks we will transition to every other day Santyl and steroid cream. He has been encouraged to monitor for changes and notify clinic with any concerns 12/15/17 on evaluation today patient's left lateral motion the ulcer and fortunately is doing worse again at this point. This just since last week to this week has close to doubled in size according to the patient. I did not seeing last week's I do not have a visual to compare this to in our system was also down so we do not have all the charts and at this point. Nonetheless it does have me somewhat concerned in regard to the fact that again he was worried enough about it he has contact the dermatology that placed them back on the full strength, 50 mg a day of the  prednisone that he was taken previous. He continues to alternate using clobetasol along with Santyl at this point. He is obviously somewhat frustrated. JAFAR, POFFENBERGER ([id number]) 12/22/17 on evaluation today patient appears to be doing a little worse  compared to last evaluation. Unfortunately the wound is a little deeper and slightly larger than the last week's evaluation. With that being said he has made some progress in regard to the irritation surrounding at this time unfortunately despite that progress that's been made he still has a significant issue going on here. 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 For that reason I discussed with him today the possibility of perform a wound culture to ensure there's nothing overtly infected. 01/06/18 on evaluation today patient's wound is larger and deeper than previously evaluated. With that being said it did appear that his wound was infected after my last evaluation with him. Subsequently I did end up prescribing a prescription for Bactrim DS which she has been taking and having no complication with. Fortunately there does not appear to be any evidence of infection at this point in time as far as anything spreading, no want to touch, and overall I feel like things are showing signs of improvement. 01/13/18 on evaluation today patient appears to be even a little larger and deeper than last time. There still muscle exposed in the base of the wound. Nonetheless he does appear to be less erythematous I do believe inflammation is calming down also believe the infection looks like it's probably resolved at this time based on what I'm seeing. No fevers, chills, nausea, or vomiting noted at this time. 01/30/18 on evaluation today patient actually appears to visually look better for the most part. Unfortunately  those visually this looks better he does seem to potentially have what may be an abscess in the muscle that has been noted in the central portion of the wound. This is the first time that I have noted what appears to be fluctuance in the central portion of the muscle. With that being said I'm somewhat more concerned about the fact that this might indicate an abscess formation at this location. I do believe that an ultrasound would be appropriate. This is likely something we need to try to do as soon as possible. He has been switch to mupirocin ointment and he is no longer using the steroid ointment as prescribed by dermatology he sees them again next week he's been decreased from 60 to 40 mg of prednisone. 03/09/18 on evaluation today patient actually appears to be doing a little better compared to last time I saw him. There's not as much erythema surrounding the wound itself. He I did review his most recent infectious disease note which was dated 02/24/18. He saw Dr. Michel Bickers in [place]. 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 Patient is now off of antibiotics and they are continually observing this. There seems to be no change in the past two weeks in my pinion based on what the patient says and what I see today compared to what Dr. Megan Salon likely saw two weeks ago. No fevers, chills, nausea, or vomiting noted at this time. 03/23/18 on evaluation today patient's wound actually appears to be showing signs of improvement which is good news. He is currently still on the Dapsone. He is also working on tapering the prednisone to get off of this and Dr. Lottie Rater is working with him in this regard. Nonetheless overall I feel like the wound is doing well it  does appear based on the infectious disease note that I reviewed from Dr. 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 I did also review the results of his ultrasound of left lower extremity which revealed there was a dentist tissue in the base of the wound without an abscess noted. 04/06/18 on evaluation today the patient's left lateral lower extremity ulcer actually appears to be doing fairly well which is excellent news. There does not appear to be any evidence of infection at this time which is also great news. Overall he still does have a significantly large ulceration although little by little he seems to be making progress. He is down to 10 mg a day of the prednisone. 04/20/18 on evaluation today patient actually appears to be doing excellent at this time in regard to his left lower extremity ulcer. He's making signs of good progress unfortunately this is taking much longer than we would really like to see but nonetheless he is making progress. Fortunately there does not appear to be any evidence of infection at this time. No fevers, chills, nausea, or vomiting noted at this time. The patient has not been using the Santyl due to the cost he hadn't got in this field yet. He's mainly been using the antibiotic ointment topically. Subsequently he also tells me that he really has not been scrubbing in the shower I think this would be helpful again as I told him it doesn't have to be anything too aggressive to even make it believe just enough to keep it free of some of the loose slough and biofilm on the wound surface. 05/11/18 on evaluation today patient's wound appears to be making slow but sure progress in regard to the left lateral lower extremity ulcer. He is been tolerating the dressing changes without complication. Fortunately there does not appear to be any evidence of infection at this time. He is still just using triple antibiotic ointment along with clobetasol occasionally over the area. He never got the Santyl and really does not  seem to intend to in my pinion. 06/01/18 on evaluation today patient appears to be doing a little better in regard to his left lateral lower extremity ulcer. He states that overall he does not feel like he is doing as well with the Dapsone as he did with the prednisone. Nonetheless he sees his dermatologist later today and is gonna talk to them about the possibility of going back on the prednisone. Overall again I believe that the wound would be better if you would utilize Santyl but he really does not seem to be interested in going back to the [place] at this point. He has been using triple antibiotic ointment. 06/15/18 on evaluation today patient's wound actually appears to be doing about the same at this point. Fortunately there is no signs of infection at this time. He has made slight improvements although he continues to not really want to clean the wound bed at this point. He states that he just doesn't mess with it he doesn't want to cause any problems with everything else he has going on. He has been on medication, antibiotics as prescribed by his dermatologist, for a staff infection of his lower extremities which is really drying out now and looking much better he tells me. Fortunately there is no sign of overall infection. 06/29/18 on evaluation today patient appears to be doing  well in regard to his left lateral lower surety ulcer all things considering. Fortunately his staff infection seems to be greatly improved compared to previous. He has no signs of infection and this is drying up quite nicely. He is still the doxycycline for this is no longer on cental, Dapsone, or any of the other medications. His dermatologist has recommended possibility of an infusion but right now he does not want to proceed with that. 07/13/18 on evaluation today patient appears to be doing about the same in regard to his left lateral lower surety ulcer. Fortunately there's no signs of infection at this time which is  great news. Unfortunately he still builds up a significant amount of Slough/biofilm of the surface of the wound he still is not really cleaning this as he should be appropriately. Again I'm able to easily with saline and gauze remove the majority of this on the surface which if you would do this at home would likely be a dramatic improvement for him as far as getting the area to improve. Nonetheless overall I still feel like he is making progress is just very slow. I think Santyl will be of benefit for him as well. Still he has not gotten this as of this point. 07/27/18 on evaluation today patient actually appears to be doing little worse in regards of the erythema around the periwound region of the wound he also tells me that he's been having more drainage currently compared to what he was experiencing last time I saw him. He states not quite as bad as what he had because this was infected previously but nonetheless is still appears to be doing poorly. Fortunately there is no evidence of systemic infection at this point. The patient tells me that he is not going to be able to afford the Santyl. He is still waiting to hear about the infusion therapy with his dermatologist. Apparently she wants an updated colonoscopy first. GRAESON, NOURI ([id number]) 08/10/18 on evaluation today patient appears to be doing better in regard to his left lateral lower extremity ulcer. Fortunately he is showing signs of improvement in this regard he's actually been approved for Remicade infusion's as well although this has not been scheduled as of yet. Fortunately there's no signs of active infection at this time in regard to the wound although he is having some issues with infection of the right lower extremity is been seen as dermatologist for this. Fortunately they are definitely still working with him trying to keep things under control. 09/07/18 on evaluation today patient is actually doing rather well in regard to his  left lateral lower extremity ulcer. He notes these actually having some hair grow back on his extremity which is something he has not seen in years. He also tells me that the pain is really not giving them any trouble at this time which is also good news overall she is very pleased with the progress he's using a combination of the mupirocin along with the probate is all mixed. 09/21/18 on evaluation today patient actually appears to be doing fairly well all things considered in regard to his looks from the ulcer. He's been tolerating the dressing changes without complication. Fortunately there's no signs of active infection at this time which is good news he is still on all antibiotics or prevention of the staff infection. He has been on prednisone for time although he states it is gonna contact his dermatologist and see if she put them on a short course due  to some irritation that he has going on currently. Fortunately there's no evidence of any overall worsening this is going very slow I think cental would be something that would be helpful for him although he states that $50 for tube is quite expensive. He therefore is not willing to get that at this point. 10/06/18 on evaluation today patient actually appears to be doing decently well in regard to his left lateral leg ulcer. He's been tolerating the dressing changes without complication. Fortunately there's no signs of active infection at this time. Overall I'm actually rather pleased with the progress he's making although it's slow he doesn't show any signs of infection and he does seem to be making some improvement. I do believe that he may need a switch up and dressings to try to help this to heal more appropriately and quickly. 10/19/18 on evaluation today patient actually appears to be doing better in regard to his left lateral lower extremity ulcer. This is shown signs of having much less Slough buildup at this point due to the fact he has been  using the [employer]. Obviously this is very good news. The overall size of the wound is not dramatically smaller but again the appearance is. 11/02/18 on evaluation today patient actually appears to be doing quite well in regard to his lower [place] ulcer. A lot of the skin around the ulcer is actually somewhat irritating at this point this seems to be more due to the dressing causing irritation from the adhesive that anything else. Fortunately there is no signs of active infection at this time. 11/24/18 on evaluation today patient appears to be doing a little worse in regard to his overall appearance of his lower extremity ulcer. There's more erythema and warmth around the wound unfortunately. He is currently on doxycycline which he has been on for some time. With that being said I'm not sure that seems to be helping with what appears to possibly be an acute cellulitis with regard to his left lower extremity ulcer. No fevers, chills, nausea, or vomiting noted at this time. 12/08/18 on evaluation today patient's wounds actually appears to be doing significantly better compared to his last evaluation. He has been using Santyl along with alternating tripling about appointment as well as the steroid cream seems to be doing quite well and the wound is showing signs of improvement which is excellent news. Fortunately there's no evidence of infection and in fact his culture came back negative with only normal skin flora noted. 12/21/2018 upon evaluation today patient actually appears to be doing excellent with regard to his ulcer. This is actually the best that I have seen it since have been helping to take care of him. It is both smaller as well as less slough noted on the surface of the wound and seems to be showing signs of good improvement with new skin growing from the edges. He has been using just the triamcinolone he does wonder if he can get a refill of that ointment today. 01/04/2019 upon evaluation today  patient actually appears to be doing well with regard to his left lateral lower extremity ulcer. With that being said it does not appear to be that he is doing quite as well as last time as far as progression is concerned. There does not appear to be any signs of infection or significant irritation which is good news. With that being said I do believe that he may benefit from switching to a collagen based dressing based on  how clean The wound appears. 01/18/2019 on evaluation today patient actually appears to be doing well with regard to his wound on the left lower extremity. He is not made a lot of progress compared to where we were previous but nonetheless does seem to be doing okay at this time which is good news. There is no signs of active infection which is also good news. My only concern currently is I do wish we can get him into utilizing the collagen dressing his insurance would not pay for the supplies that we ordered although it appears that he may be able to order this through his supply company that he typically utilizes. This is [employer]. Nonetheless he did try to order it during the office visit today and it appears this did go through. We will see if he can get that it is a different brand but nonetheless he has collagen and I do think will be beneficial. 02/01/2019 on evaluation today patient actually appears to be doing a little worse today in regard to the overall size of his wounds. Fortunately there is no signs of active infection at this time. That is visually. Nonetheless when this is happened before it was due to infection. For that reason were somewhat concerned about that this time as well. 02/08/2019 on evaluation today patient unfortunately appears to be doing slightly worse with regard to his wound upon evaluation today. Is measuring a little deeper and a little larger unfortunately. I am not really sure exactly what is causing this to enlarge he actually did see  his dermatologist she is going to see about initiating Humira for him. Subsequently she also did do steroid injections into the wound itself in the periphery. Nonetheless still nonetheless he seems to be getting a little bit larger he is gone back to just using the steroid cream topically which I think is appropriate. I would say hold off on the collagen for the time being is definitely a good thing to do. Based on the culture results which we finally did get the final result back regarding it shows staph as the bacteria noted again that can be a normal skin bacteria based on the fact however he is having increased drainage and worsening of the wound measurement wise I would go ahead and place him on an antibiotic today I do believe for this. 02/15/2019 on evaluation today patient actually appears to be doing somewhat better in regard to his ulcer. There is no signs of worsening at this time I did review his culture results which showed evidence of Staphylococcus aureus but not MRSA. Again this could just be more related to the normal skin bacteria although he states the drainage has slowed down quite a bit he may have had a mild infection not just colonization. And was much smaller and then since around10/04/2019 on evaluation today patient appears to be doing unfortunately worse as far as the size of the wound. I really feel like that this is steadily getting larger again it had been doing excellent right at the beginning of September we have seen a steady increase in the area of the wound it is almost 2-1/2 times the size it was on September 1. Obviously this is a bad trend this is not wanting to see. For that reason we went back to using just the topical triamcinolone cream which does seem to help with inflammation. I checked him for bacteria by way of culture and nothing showed positive there. I am considering giving  him a short course of a tapering steroid Shelia Magallon, [place] ([id number]) today  to see if that is can be beneficial for him. The patient is in agreement with giving that a try. 03/08/2019 on evaluation today patient appears to be doing very well in comparison to last evaluation with regard to his lower extremity ulcer. This is showing signs of less inflammation and actually measuring slightly smaller compared to last time every other week over the past month and a half he has been measuring larger larger larger. Nonetheless I do believe that the issue has been inflammation the prednisone does seem to have been beneficial for him which is good news. No fevers, chills, nausea, vomiting, or diarrhea. 03/22/2019 on evaluation today patient appears to be doing about the same with regard to his leg ulcer. He has been tolerating the dressing changes without complication. With that being said the wound seems to be mostly arrested at its current size but really is not making any progress except for when we prescribed the prednisone. He did show some signs of dropping as far as the overall size of the wound during that interval week. Nonetheless this is something he is not on long-term at this point and unfortunately I think he is getting need either this or else the Humira which his dermatologist has discussed try to get approval for. With that being said he will be seeing his dermatologist on the 11th of this month that is November. 04/19/2019 on evaluation today patient appears to be doing really about the same the wound is measuring slightly larger compared to last time I saw him. He has not been into the office since November 2 due to the fact that he unfortunately had Covid as that his entire family. He tells me that it was rough but they did pull-through and he seems to be doing much better. Fortunately there is no signs of active infection at this time. No fevers, chills, nausea, vomiting, or diarrhea. 05/10/2019 on evaluation today patient unfortunately appears to be doing  significantly worse as compared to last time I saw him. He does tell me that he has had his first dose of Humira and actually is scheduled to get the next one in the upcoming week. With that being said he tells me also that in the past several days he has been having a lot of issues with green drainage she showed me a picture this is more blue-green in color. He is also been having issues with increased sloughy buildup and the wound does appear to be larger today. Obviously this is not the direction that we want everything to take based on the starting of his Humira. Nonetheless I think this is definitely a result of likely infection and to be honest I think this is probably Pseudomonas causing the infection based on what I am seeing. 05/24/2019 on evaluation today patient unfortunately appears to be doing significantly worse compared to his prior evaluation with me 2 weeks ago. I did review his culture results which showed that he does have Staph aureus as well as Pseudomonas noted on the culture. Nonetheless the Levaquin that I prescribed for him does not appear to have been appropriate and in fact he tells me he is no longer experiencing the green drainage and discharge that he had at the last visit. Fortunately there is no signs of active infection at this time which is good news although the wound has significantly worsened it in fact is much deeper than  it was previous. We have been utilizing up to this point triamcinolone ointment as the prescription topical of choice but at this time I really feel like that the wound is getting need to be packed in order to appropriately manage this due to the deeper nature of the wound. Therefore something along the lines of an alginate dressing may be more appropriate. 05/31/2019 upon inspection today patient's wound actually showed signs of doing poorly at this point. Unfortunately he just does not seem to be making any good progress despite what we have tried.  He actually did go ahead and pick up the Cipro and start taking that as he was noticing more green drainage he had previously completed the Levaquin that I prescribed for him as well. Nonetheless he missed his appointment for the seventh last week on Wednesday with the wound care center and [facility] where his dermatologist referred him. Obviously I do think a second opinion would be helpful at this point especially in light of the fact that the patient seems to be doing so poorly despite the fact that we have tried everything that I really know how at this point. The only thing that ever seems to have helped him in the past is when he was on high doses of continual steroids that did seem to make a difference for him. 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 06/07/2019 upon evaluation today patient unfortunately appears to be doing worse yet again with regard to his wound. In fact I am starting to question whether or not he may have a fluid pocket in the muscle at this point based on the bulging and the soft appearance to the central portion of the muscle area. There is not anything draining from the muscle itself at this time which is good news but nonetheless the wound is expanding. I am not really seeing any results of the Humira as far as overall wound progression based on what I am seeing at this point. The patient has been referred for second opinion with regard to his wound to the [facility] wound care center by his dermatologist which I definitely am not in opposition to. Unfortunately we tried multiple dressings in the past including collagen, alginate, and at one point even Hydrofera Blue. With that being said he is never really used it for any significant amount of time due to the fact that he often complains of pain associated with  these dressings and then will go back to either using the Santyl which she has done intermittently or more frequently the triamcinolone. He is also using his own compression stockings. We have wrapped him in the past but again that was something else that he really was not a big fan of. 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 He seemed to do much better when he was on the steroids although honestly I understand that the steroids are not necessarily the best medication to be on  long-term obviously 06/14/2019 on evaluation today patient appears to be doing actually a little bit better with regard to the overall appearance with his leg. Unfortunately he does continue to have issues with what appears to be some fluid underneath the muscle although he did see the wound specialty center at [facility] last week their main goals were to see about infusion therapy in place of the Humira as they feel like that is not quite strong enough. They also recommended that we continue with the treatment otherwise as we are they felt like that was appropriate and they are okay with him continuing to follow-up here with [place] in that regard. With that being said they are also sending him to the vein specialist there to see about vein stripping and if that would be of benefit for him. Subsequently they also did not really address whether or not an ultrasound of the muscle area to see if there is anything that needs to be addressed here would be appropriate or not. For that reason I discussed this with him last week I think we may proceed down that road at this point. 06/21/2019 upon evaluation today patient's  wound actually appears to be doing slightly better compared to previous evaluations. I do believe that he has made a difference with regard to the progression here with the use of oral steroids. Again in the past has been the only thing that is really calm things down. He does tell me that from [facility] is gotten a good news from there that there are no further vein stripping that is necessary at this point. I do not have that available for review today although the patient did relay this to me. He also did obtain and have the ultrasound of the wound completed which I did sign off on today. It does appear that there is no fluid collection under the muscle this is likely then just edematous tissue in general. That is also good news. Overall I still believe the inflammation is the main issue here. He did inquire about the possibility of a wound VAC again with the muscle protruding like it is I am not really sure whether the wound VAC is necessarily ideal or not. That is something we will have to consider although I do believe he may need compression wrapping to try to help with edema control which could potentially be of benefit. 06/28/2019 on evaluation today patient appears to be doing slightly better measurement wise although this is not terribly smaller he least seems to be trending towards that direction. With that being said he still seems to have purulent drainage noted in the wound bed at this time. He has been on Levaquin followed by Cipro over the past month. Unfortunately he still seems to have some issues with active infection at this time. I did perform a culture last week in order to evaluate and see if indeed there was still anything going on. Subsequently the culture did come back Sinkler, [place] ([id number]) showing Pseudomonas which is consistent with the drainage has been having which is blue-green in color. He also has had an odor that again was somewhat consistent with Pseudomonas as well.  Long story short it appears that the culture showed an intermediate finding with regard to how well the Cipro will work for the Pseudomonas infection. Subsequently being that he does not seem to be clearing up and at best what we are doing is just keeping this at [place] I think he may need to  see infectious disease to discuss IV antibiotic options. 07/05/2019 upon evaluation today patient appears to be doing okay in regard to his leg ulcer. He has been tolerating the dressing changes at this point without complication. Fortunately there is no signs of active infection at this time which is good news. No fevers, chills, nausea, vomiting, or diarrhea. With that being said he does have an appointment with infectious disease tomorrow and his primary care on Wednesday. Again the reason for the infectious disease referral was due to the fact that he did not seem to be fully resolving with the use of oral antibiotics and therefore we were thinking that IV antibiotic therapy may be necessary secondary to the fact that there was an intermediate finding for how effective the Cipro may be. Nonetheless again he has been having a lot of purulent and even green drainage. Fortunately right now that seems to have calmed down over the past week with the reinitiation of the oral antibiotic. Nonetheless we will see what Dr. Megan Salon has to say. 07/12/2019 upon evaluation today patient appears to be doing about the same at this point in regard to his left lower extremity ulcer. Fortunately there is no signs of active infection at this time which is good news I do believe the Levaquin has been beneficial I did review Dr. Hale Bogus note and to be honest I agree that the patient's leg does appear to be doing better currently. What we found in the past as he does not seem to really completely resolve he will stop the antibiotic and then subsequently things will revert back to having issues with blue-green drainage,  increased pain, and overall worsening in general. Obviously that is the reason I sent him back to infectious disease. 07/19/2019 upon evaluation today patient appears to be doing roughly the same in size there is really no dramatic improvement. He has started back on the Levaquin at this point and though he seems to be doing okay he did still have a lot of blue/green drainage noted on evaluation today unfortunately. I think that this is still indicative more likely of a Pseudomonas infection as previously noted and again he does see Dr. Megan Salon in just a couple of days. I do not know that were really able to effectively clear this with just oral antibiotics alone based on what I am seeing currently. Nonetheless we are still continue to try to manage as best we can with regard to the patient and his wound. I do think the wrap was helpful in decreasing the edema which is excellent news. No fevers, chills, nausea, vomiting, or diarrhea. 07/26/2019 upon evaluation today patient appears to be doing slightly better with regard to the overall appearance of the muscle there is no dark discoloration centrally. Fortunately there is no signs of active infection at this time. No fevers, chills, nausea, vomiting, or diarrhea. Patient's wound bed currently the patient did have an appointment with Dr. Megan Salon at infectious disease last week. With that being said Dr. Megan Salon the patient states was still somewhat hesitant about put him on any IV antibiotics he wanted [place] to repeat cultures today and then see where things go going forward. He does look like Dr. Megan Salon because of some improvement the patient did have with the Levaquin wanted [place] to see about repeating cultures. If it indeed grows the Pseudomonas again then he recommended a possibility of considering a PICC line placement and IV antibiotic therapy. He plans to see the patient back in 1  to 2 weeks. 08/02/2019 upon evaluation today patient appears to be  doing poorly with regard to his left lower extremity. We did get the results of his culture back it shows that he is still showing evidence of Pseudomonas which is consistent with the purulent/blue-green drainage that he has currently. Subsequently the culture also shows that he now is showing resistance to the oral fluoroquinolones which is unfortunate as that was really the only thing to treat the infection prior. I do believe that he is looking like this is going require IV antibiotic therapy to get this under control. Fortunately there is no signs of systemic infection at this time which is good news. The patient does see Dr. Megan Salon tomorrow. 08/09/2019 upon evaluation today patient appears to be doing better with regard to his left lower extremity ulcer in regard to the overall appearance. He is currently on IV antibiotic therapy. As ordered by Dr. Megan Salon. Currently the patient is on ceftazidime which she is going to take for the next 2 weeks and then follow-up for 4 to 5-week appointment with Dr. Megan Salon. The patient started this this past Friday symptoms have not for a total of 3 days currently in full. 08/16/2019 upon evaluation today patient's wound actually does show muscle in the base of the wound but in general does appear to be much better as far as the overall evidence of infection is concerned. In fact I feel like this is for the most part cleared up he still on the IV antibiotics he has not completed the full course yet but I think he is doing much better which is excellent news. 08/23/2019 upon evaluation today patient appears to be doing about the same with regard to his wound at this point. He tells me that he still has pain unfortunately. Fortunately there is no evidence of systemic infection at this time which is great news. There is significant muscle protrusion. 09/13/19 upon evaluation today patient appears to be doing about the same in regard to his leg unfortunately. He still  has a lot of drainage coming from the ulceration there is still muscle exposed. With that being said the patient's last wound culture still showed an intermediate finding with regard to the Pseudomonas he still having the bluish/green drainage as well. Overall I do not know that the wound has completely cleared of infection at this point. Fortunately there is no signs of active infection systemically at this point which is good news. 09/20/2019 upon evaluation today patient's wound actually appears to be doing about the same based on what I am seeing currently. I do not see any signs of systemic infection he still does have evidence of some local infection and drainage. He did see Dr. Megan Salon last week and Dr. Megan Salon states that he probably does need a different IV antibiotic although he does not want to put him on this until the patient begins the Remicade infusion which is actually scheduled for about 10 days out from today on 13 May. Following that time Dr. Megan Salon is good to see him back and then will evaluate the feasibility of starting him on the IV antibiotic therapy once again at that point. I do not disagree with this plan I do believe as Dr. Megan Salon stated in his note that I reviewed today that the patient's issue is multifactorial with the pyoderma being 1 aspect of this that were hoping the Remicade will be helpful for her. In the meantime I think the gentamicin is, helping to  keep things under decent okay control in regard to the ulcer. 09/27/2019 upon evaluation today patient appears to be doing about the same with regard to his wound still there is a lot of muscle exposure though he does have some hyper granulation tissue noted around the edge and actually some granulation tissue starting to form over the muscle which is actually good news. Fortunately there is no evidence of active infection which is also good news. His pain is less at this point. 5/21; this is a patient I have not  seen in a long time. 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 He has a large wound on the left lateral leg with protruding muscle. He comes in the clinic today showing the same area on his left medial ankle. He says there is been a spot there for some time although we have not previously defined this. Today he has a clearly defined area with slight amount of skin breakdown surrounded by raised areas with a purplish hue in color. This is not painful he says it is irritated. 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 He still has muscle protruding from the lateral portion of his left leg but fortunately the new area that they were concerned about at his last visit does not appear to have opened at this point. He is currently on Remicade infusions and seems to be doing better in my opinion in fact the wound itself seems to be overall much better. The purplish discoloration that he did have seems to have resolved and I think that is a good sign that hopefully the Remicade is doing its job. He does Mcgirr, Pacey ([id number]) have some biofilm noted over the surface of the wound. 11/01/2019 on evaluation today patient's wound actually appears to be doing excellent at this time. Fortunately there is no evidence of active infection and overall I feel like he is making great progress. The Remicade seems to be due excellent job in my opinion. 11/08/19 evaluation today vision actually appears to be doing quite well with regard to his weight ulcer. He's been tolerating dressing changes without complication. Fortunately there is no evidence of infection. No fevers, chills, nausea, or vomiting noted at this time. Overall states that is having more itching than pain which is actually a good sign in my opinion. 12/13/2019 upon evaluation today patient appears to be doing  well today with regard to his wound. He has been tolerating the dressing changes without complication. Fortunately there is no sign of active infection at this time. No fevers, chills, nausea, vomiting, or diarrhea. Overall I feel like the infusion therapy has been very beneficial for him. 01/06/2020 on evaluation today patient appears to be doing well with regard to his wound. This is measuring smaller and actually looks to be doing better. Fortunately there is no signs of active infection at this point. No fevers, chills, nausea, vomiting, or diarrhea. With that being said he does still have the blue-green drainage but this does not seem to be causing any significant issues currently. He has been using the gentamicin that does seem to be keeping things under decent control at this point. He goes later this morning for his next infusion therapy for the pyoderma which seems to also be very beneficial. 02/07/2020 on evaluation today patient appears to be doing about the same in regard to his wounds currently. Fortunately there is  no signs of active infection systemically he does still have evidence of local infection still using gentamicin. He also is showing some signs of improvement albeit slowly I do feel like we are making some progress here. 02/21/2020 upon evaluation today patient appears to be making some signs of improvement the wound is measuring a little bit smaller which is great news and overall I am very pleased with where he stands currently. He is going to be having infusion therapy treatment on the 15th of this month. Fortunately there is no signs of active infection at this time. 03/13/2020 I do believe patient's wound is actually showing some signs of improvement here which is great news. He has continue with the infusion therapy through rheumatology/dermatology at [facility]. That does seem to be beneficial. I still think he gets as much benefit from this as he did from the prednisone  initially but nonetheless obviously this is less harsh on his body that the prednisone as far as they are concerned. 03/31/2020 on evaluation today patient's wound actually showing signs of some pretty good improvement in regard to the overall appearance of the wound bed. There is still muscle exposed though he does have some epithelial growth around the edges of the wound. Fortunately there is no signs of active infection at this time. No fevers, chills, nausea, vomiting, or diarrhea. 04/24/2020 upon evaluation today patient appears to be doing about the same in regard to his leg ulcer. He has been tolerating the dressing changes without complication. Fortunately there is no signs of active infection at this time. No fevers, chills, nausea, vomiting, or diarrhea. With that being said he still has a lot of irritation from the bandaging around the edges of the wound. We did discuss today the possibility of a referral to plastic surgery. 05/22/2020 on evaluation today patient appears to be doing well with regard to his wounds all things considered. He has not been able to get the Chantix apparently there is a recall nurse that I was unaware of put out by [employer] involuntarily. Nonetheless for now I am and I have to do some research into what may be the best option for him to help with quitting in regard to smoking and we discussed that today. 06/26/2020 upon evaluation today patient appears to be doing well with regard to his wound from the standpoint of infection I do not see any signs of infection at this point. With that being said unfortunately he is still continuing to have issues with muscle exposure and again he is not having a whole lot of new skin growth unfortunately. There does not appear to be any signs of active infection at this time. No fevers, chills, nausea, vomiting, or diarrhea. 07/10/2020 upon evaluation today patient appears to be doing a little bit more poorly currently compared to where  he was previous. I am concerned currently about an active infection that may be getting worse especially in light of the increased size and tenderness of the wound bed. No fevers, chills, nausea, vomiting, or diarrhea. 07/24/2020 upon evaluation today patient appears to be doing poorly in regard to his leg ulcer. He has been tolerating the dressing changes without complication but unfortunately is having a lot of discomfort. 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 Subsequently I think he is going require possibly IV antibiotics to get this under control. I am very concerned about the severity of his infection and the amount of discomfort he  is having. 07/31/2020 upon evaluation today patient appears to be doing about the same in regard to his leg wound. He did see Dr. Megan Salon and Dr. Megan Salon is actually going to start him on IV antibiotics. He goes for the PICC line tomorrow. With that being said there do not have that run for 2 weeks and then see how things are doing and depending on how he is progressing they may extend that a little longer. Nonetheless I am glad this is getting ready to be in place and definitely feel it may help the patient. In the meantime is been using mainly triamcinolone to the wound bed has an anti-inflammatory. 08/07/2020 on evaluation today patient appears to be doing well with regard to his wound compared even last week. In the interim he has gotten the PICC line placed and overall this seems to be doing excellent. There does not appear to be any evidence of infection which is great news systemically although locally of course has had the infection this appears to be improving with the use of the antibiotics. 08/14/2020 upon evaluation today patient's wound actually showing signs of excellent improvement. Overall the irritation has significantly improved the drainage is back down to more of a normal  level and his pain is really pretty much nonexistent compared to what it was. Obviously I think that this is significantly improved secondary to the IV antibiotic therapy which has made all the difference in the world. Again he had a resistant form of Pseudomonas for which oral antibiotics just was not cutting it. Nonetheless I do think that still we need to consider the possibility of a surgical closure for this wound is been open so long and to be honest with muscle exposed I think this can be very hard to get this to close outside of this although definitely were still working to try to do what we can in that regard. 08/21/2020 upon evaluation today patient appears to be doing very well with regard to his wounds on the left lateral lower extremity/calf area. Fortunately there does not appear to be signs of active infection which is great news and overall very pleased with where things stand today. He is actually wrapping up his treatment with IV antibiotics tomorrow. After that we will see where things go from there. 08/28/2020 upon evaluation today patient appears to be doing decently well with regard to his leg ulcer. There does not appear to be any signs of Moncus, Dwon ([id number]) active infection which is great news and overall very pleased with where things stand today. No fevers, chills, nausea, vomiting, or diarrhea. 09/18/2020 upon evaluation today patient appears to be doing well with regard to his infection which I feel like is better. Unfortunately he is not doing as well with regard to the overall size of the wound which is not nearly as good at this point. I feel like that he may be having an issue here with the pyoderma being somewhat out of control. I think that he may benefit from potentially going back and talking to the dermatologist about what to do from the pyoderma standpoint. I am not certain if the infusions are helping nearly as much is what the prednisone did in the  past. 10/02/2020 upon evaluation today patient appears to be doing well with regard to his leg ulcer. He did go to the [facility] nurse. Unfortunately they feel like there is a 10% chance that most that he would be able to heal and that the  skin graft would take. Obviously this has led him to not be able to go down that path as far as treatment is concerned. Nonetheless he does seem to be doing a little bit better with the prednisone that I gave him last time. I think that he may need to discuss with dermatology the possibility of long-term prednisone as that seems to be what is most helpful for him to be perfectly honest. I am not sure the Remicade is really doing the job. 10/17/2020 upon evaluation today patient appears to be doing a little better in regard to his wound. In fact the case has been since we did the prednisone on May 2 for him that we have noticed a little bit of improvement each time we have seen a size wise as well as appearance wise as well as pain wise. I think the prednisone has had a greater effect then the infusion therapy has to be perfectly honest. With that being said the patient also feels significantly better compared to what he was previous. All of this is good news but nonetheless I am still concerned about the fact that again we are really not set up to long-term manage him as far as prednisone is concerned. Obviously there are things that you need to be watched I completely understand the risk of prednisone usage as well. 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 With all that being said I do believe that we can give him another round of the prednisone which she is requesting today because of the improvement that he seen since we did that first round. 10/30/2020 upon evaluation today patient's wound actually is showing signs of doing quite well. There does not appear to be any evidence of infection which is great news and  overall very pleased with where things stand today. No fevers, chills, nausea, vomiting, or diarrhea. He tells me that the prednisone still has seem to have helped he wonders if we can extend that for just a little bit longer. He did not have the appointment with a dermatologist although he did have an infusion appointment last Friday. That was at [facility]. With that being said he tells me he could not do both that as well as the appointment with the physician on the same day therefore that is can have to be rescheduled. I really want to see if there is anything they feel like that could be done differently to try to help this out as I am not really certain that the infusions are helping significantly here. 11/13/2020 upon evaluation today patient unfortunately appears to be doing somewhat poorly in regard to his wound I feel like this is actually worsening from the standpoint of the pyoderma spreading. I still feel like that he may need something different as far as trying to manage this going forward. Again we did the prednisone unfortunately his blood sugars are not doing so well because of this. Nonetheless I believe that the patient likely needs to try topical steroid. We have done triamcinolone for a while I think going with something stronger such as clobetasol could be beneficial again this is not something I do lightly I discussed this with the patient that again this does not normally put underneath an occlusive dressing. Nonetheless I think a thin film as such could help with some of the stronger anti-inflammatory effects. We discussed this today. He would like to try to give this a trial  for the next couple weeks. I definitely think that is something that we can do. Evaluate7/03/2021 and today patient's wound bed actually showed signs of doing really about the same. There was a little expansion of the size of the wound and that leading edge that we done looking out although the clobetasol does  seem to have slowed this down a bit in my opinion. There is just 1 small area that still seems to be progressing based on what I see. Nonetheless I am concerned about the fact this does not seem to be improving if anything seems to be doing a little bit worse. I do not know that the infusions are really helping him much as next infusion is August 5 his appointment with dermatology is July 25. Either way I really think that we need to have a conversation potentially about this and I am actually going to see if I can talk with Dr. Lillia Carmel in order to see where things stand as well. 12/11/2020 upon evaluation today patient appears to be doing worse in regard to his leg ulcer. Unfortunately I just do not think this is making the progress that I would like to see at this point. Honestly he does have an appointment with dermatology and this is in 2 days. I am wondering what they may have to offer to help with this. Right now what I am seeing is that he is continuing to show signs of worsening little by little. Obviously that is not great at all. Is the exact opposite of what we are looking for. 12/18/2020 upon evaluation today patient appears to be doing a little better in regard to his wound. The dermatologist actually did do some steroid injections into the wound which does seem to have been beneficial in my opinion. That was on the 25th already this looks a little better to me than last time I saw him. With that being said we did do a culture and this did show that he has Staph aureus noted in abundance in the wound. With that being said I do think that getting him on an oral antibiotic would be appropriate as well. Also think we can compression wrap and this will make a difference as well. 12/28/2020 upon evaluation today patient's wound is actually showing signs of doing much better. I do believe the compression wrap is helping he has a lot of drainage but to be honest I think that the compression is  helping to some degree in this regard as well as not draining through which is also good news. No fevers, chills, nausea, vomiting, or diarrhea. 01/04/2021 upon evaluation today patient appears to be doing well with regard to his wound. Overall things seem to be doing quite well. He did have a little bit of reaction to the CarboFlex Sorbact he will be using that any longer. With that being said he is controlled as far as the drainage is concerned overall and seems to be doing quite well. I do not see any signs of active infection at this time which is great news. No fevers, chills, nausea, vomiting, or diarrhea. 01/11/2021 upon evaluation today patient appears to be doing well with regard to his wounds. He has been tolerating the dressing changes without complication. Fortunately there does not appear to be any signs of active infection at this time which is great news. Overall I am extremely pleased with where we stand currently. No fevers, chills, nausea, vomiting, or diarrhea. Where using clobetasol in the wound  bed he has a lot of new skin growth which is awesome as well. 01/18/2021 upon evaluation today patient appears to be doing very well in regard to his leg ulcer. He has been tolerating the dressing changes without complication. Fortunately there does not appear to be any signs of active infection which is great news. In general I think that he is making excellent progress 01/25/2021 upon evaluation today patient appears to be doing well with regard to his wound on the leg. I am actually extremely pleased with where things stand today. There does not appear to be any signs of active infection which is great news and overall I think that we are definitely headed in the appropriate direction based on what I am seeing currently. There does not appear to be any signs of active infection also excellent news. 02/06/2021 upon evaluation today patient appears to be doing well with regard to his wound.  Overall visually this is showing signs of significant Patchin, Hadi ([id number]) improvement which is great news. I do not see any signs of active infection systemically which is great even locally I do not think that we are seeing any major complications here. We did do fluorescence imaging with the [employer] today. The patient does have some odor and drainage noted and again this is something that I think would benefit him to probably come more frequently for nurse visits. 02/19/2021 upon evaluation today patient actually appears to be doing quite well in regard to his wound. He has been tolerating the dressing changes without complication and overall I think that this is making excellent progress. I do not see any evidence of active infection at this point which is great news as well. No fevers, chills, nausea, vomiting, or diarrhea. 10/10; wound is made nice progress healthy granulation with a nice rim of epithelialization which seems to be expanding even from last week he has a deeper area in the inferior part of the more distal part of the wound with not quite as healthy as surface. This area will need to be followed. Using clobetasol and Hydrofera Blue 03/05/2021 upon evaluation today patient appears to be doing very well in regard to his leg ulcer. He has been tolerating dressing changes without complication. Fortunately there does not appear to be any signs of active infection which is great news and overall I am extremely pleased with where we stand currently. 03/12/2021 upon evaluation today patient appears to be doing well with regard to his wound in fact this is extremely extremely good based on what we are seeing today there does not appear to be any signs of active infection and overall I think that he is doing awesome from the standpoint of healing in general. I am extremely pleased with how things seem to be progressing with regard to this pyoderma. Clobetasol has done wonders  for him. I think the compression wrapping has also been of great benefit. 03/19/2021 upon evaluation today patient appears to be doing well with regard to his wound. He is tolerating the dressing changes without complication. In fact I feel like that he is actually making excellent progress at this point based on what I am seeing. No fevers, chills, nausea, vomiting, or diarrhea. 03/26/2021 upon evaluation today patient appears to be doing well with regard to his wound. This again is measuring smaller and looking better. Again the progress is slow but nonetheless continual with what we have been seeing. I do believe that the current plan is doing awesome  for him. 04/09/2021 upon evaluation today patient appears to be doing well with regard to his leg ulcer. This is showing signs of excellent improvement the muscle is completely closed over and there does not appear to be any evidence of inflammation at this point his drainage is significantly improved. Overall I think that he would be a good candidate for looking into a skin substitute at this point as well. We will get a look into some approvals in that regard. Potentially TheraSkin as well as Apligraf could both be considered just depending on insurance coverage. 04/16/2021 upon evaluation today patient appears to be doing well at this point. He has been approved for the Apligraf which we could definitely order although I would like to try to get the TheraSkin approved if at all possible. I did fax notes into them today and I Georgina Peer try to give a call as well. Overall the wound appears to be doing decently well today. 04/20/2021 upon evaluation today patient actually appears to be doing quite well in regard to his wound with that being said we are trying to see what we can do about speeding up the healing process. For that reason we did discuss the possibility of a skin substitute. We got the Apligraf approved. For that reason we will get a go ahead  and see what we can do with the Apligraf at this point. I am still trying to get the TheraSkin approved but I have not heard anything from the insurance company yet I have called and talked to them earlier in the week and to be honest this was about half an hour that I spent on the phone they told me I would hear something within 10-15 business days. 04/27/2021 upon evaluation today patient appears to be doing excellent in regard to his wound. I do believe the Apligraf has been beneficial. With that being said he has had a little bit of increased pain has been a little bit concerned. I do want to go ahead and apply his steroid cream today the clobetasol and then put the Apligraf over I just do not think I want to risk not putting the clobetasol on based on what is been going on here currently. Patient voiced understanding. 05/04/2021 upon evaluation today patient is making excellent improvement. Overall there is definitely decrease in the size of the wound today and I am very pleased in that regard. I do not see any signs of active infection locally nor systemically at this point. No fevers, chills, nausea, vomiting, or diarrhea. 05/11/2021 upon evaluation today patient appears to be doing well with regard to his wound. He has been tolerating Apligraf's without complication and is making excellent progress this is application #4 today. 12/30; patient here for Apligraf application. Appears to be doing well small wound there has been a lot of healing 05/28/2021 upon evaluation today patient appears to be doing well with regard to his wound. Has been tolerating the dressing changes without complication. Fortunately I do not see any signs of active infection locally nor systemically at this point which is great news. He is done with the Apligraf and the first round and overall this has filled in quite nicely I am not even certain he needs any additional at the moment. I would actually try to go back to  clobetasol with [facility] which previously was doing well for him. 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 He is using clobetasol and  Hydrofera Blue. Surface area of the wound is smaller and the surface looks very healthy. 06/11/2021 upon evaluation today patient actually appears to be showing signs of good improvement which is great news. Fortunately I do not see any signs of active infection at this time which is also excellent news. I do believe that the patient is doing well with the clobetasol and Hydrofera Blue. He does have some irritation and itching around the rest of the leg will use some triamcinolone over this area. 06/18/2021 upon evaluation today patient appears to be doing well with regard to his wound. He is showing signs of excellent improvement and overall I am extremely pleased with where we stand today. We are moving in the right direction. 06/25/2021 upon evaluation today patient appears to be doing well with regard to his wound. In fact this is doing also not showing signs of excellent improvement overall very pleased with where we stand today. I do not see any evidence of active infection locally or systemically at this time which is also great news. 07/02/2021 upon evaluation patient's wound bed actually showed signs of good granulation and epithelization at this point. And this is measuring significantly smaller and overall I am extremely pleased with where we stand today. No fevers, chills, nausea, vomiting, or diarrhea. 07/09/2021 upon evaluation patient's wound bed actually showed signs of good granulation and epithelization at this point. Fortunately there does not appear to be any evidence of active infection locally or systemically which is great news and overall I am extremely pleased with where we YECHEZKEL, FERTIG ([id number]) stand at this point. 07/16/2021 upon evaluation today patient appears to be doing well with  regard to his wound all things considered although it is maybe a little bit larger than what its been. This does have me a little concerned. Actually question about whether anything is changed and initially he told me know. In fact it was not until the end of the visit that he actually did mention that he had missed his infusion in January. This very well may be what is because the difference is also seeing currently. That definitely has seem to been helping more recently. 07/23/2021 upon evaluation today patient's wound is yet again larger despite the switch to a collagen which was not beneficial. Subsequently I am going to at this point check on a couple things. First and foremost I do want to go ahead and do a culture. I think that there is a chance he could have a low-lying infection that may be part of the issue here. Subsequently I am also probably can go ahead and place him on Bactrim DS which I think is a good option and has done well with them in the past. Again if this is an infection that should hopefully help to turn things around. With all that being said I do believe that the patient should hopefully be able to turn this around with reinitiation of the infusion therapy which will be going on this week and subsequently getting on the antibiotic. 3/13; patient presents for follow-up. He has no issues or complaints today. He states he is currently on oral antibiotics for previous culture done in the office. 08/06/2021 upon evaluation today patient appears to be doing well with regard to his wound. I am actually seeing signs of improvement which is great news. I think that he is on a better track he did receive his infusion as well which is great news and overall I think that this  should hopefully get him back on track as far as healing is concerned. He is not having any pain and I do not see any signs of inflammation which is great news. 08/13/2021 upon evaluation today patient appears to be  doing excellent in regard to his wound on the leg. He has been tolerating the dressing changes without complication. Fortunately I do not see any signs of infection I do believe he is making good progress here and I think her back on track overall the tissue is greatly improved compared to what we have been. 08/20/2021 upon evaluation today patient appears to be doing excellent in regard to his wound. He has been tolerating the dressing changes without complication. Fortunately I do not see any evidence of infection at this time locally or systemically which is great news and overall very pleased with where we stand. 08-31-2021 upon evaluation today patient appears to be doing excellent in regard to his wound. Overall even with the vacation and extra walking he seems to have really done well. Fortunately I do not see any evidence of active infection locally or systemically which is great news and overall I think we are headed in the right direction. 09-10-2021 upon evaluation today patient's wound is showing signs of improvement. This is a small improvement but nonetheless we seem to be making progress here. I am very pleased in that regard. There does not appear to be any signs of infection and overall things are doing quite nicely. Upon inspection patient's wound bed actually showed signs of good granulation and epithelization at this point. There was some irritation around the edges of the wound where the good skin was it almost appears that something was rubbing or else is was due from drainage and irritation. Nonetheless I am going to see about putting a little bit of zinc just around the wound opening in order to protect the skin from being damaged. The patient is in agreement with that plan. 5/8; its been a while since I have seen this wound and it looks considerably better although still with some depth. Per our intake nurse the dimensions have improved patient has been using a thin layer of  clobetasol, Hydrofera Blue under 4-layer compression. He appears to be making progress 10-01-2021 upon evaluation today patient appears to be doing well with regard to his wound although it is somewhat stalled I feel like were basically at a standstill here. He has previously had Apligraf which did very well for him to be honest. I do believe that he may benefit from a repeat of the Apligraf. In fact it got so small at that point but I do not think we even used all of the applications that were recommended as this had improved to such a degree. Nonetheless I do believe that the patient currently could benefit from repeat treatment with the Apligraf due to the improvement this that we did see. He is not opposed to this and subsequently working to look into getting approval to reinstitute that treatment. 10-08-2021 upon evaluation today patient appears to be doing well with regard to his wound. Overall I feel like he is doing a little bit better from the standpoint of irritation at this time which is great news. I do not see any signs of active infection locally or systemically which is great news as well. No fevers, chills, nausea, vomiting, or diarrhea. 10-12-2021 upon evaluation today patient appears to be doing well with regard to his wound stable although we are  can initiate treatment with Apligraf today. I am hoping this will stimulate things to improve. Patient is in agreement with that plan. 10-19-2028 upon evaluation today patient appears to be doing well with regard to his wound. He has been tolerating the dressing changes without complication. Fortunately there does not appear to be any evidence of active infection locally or systemically at this time which is great news. No fevers, chills, nausea, vomiting, or diarrhea. I do believe the Apligraf has improved the overall appearance of the wound bed receiving some definite improvements here. 10-26-2021 upon evaluation today patient's wound is  actually shown some signs of really good improvement I am very pleased in this regard. There is a little bit of slough and biofilm buildup that is stuck to the surface of the wound I am getting very lightly debrided in order to clear this away and then subsequently will get a continue with the Apligraf application today. This is #3. 11-09-2021 upon evaluation today patient appears to be doing well currently in regard to his wound. This is actually showing signs of significant improvement which is great news and overall I think the Apligraf is doing an amazing job. This wound is significantly smaller compared to the last time I saw him. This will be Apligraf application #4. [id number] upon evaluation today patient appears to be doing well currently in regard to his wound he is actually showing signs of excellent improvement which is great news and overall I am extremely pleased with where we stand today. I do not see any evidence of active infection locally or systemically at this time which is great news. No fevers, chills, nausea, vomiting, or diarrhea. I believe the Apligraf is doing an awesome job and in fact I [place] see about getting an extension of application which hopefully should not be a problem. Especially in light of how much improvement he has had with regards to the size of the wound with treatment. This is application #5 today. PIER, BOSHER ([id number]) 12-07-2021 upon evaluation today patient appears to be doing excellent in regard to his wound has been tolerating the dressing changes without complication. Fortunately there does not appear to be any signs of active infection locally or systemically at this time. 12-21-2021 upon evaluation today patient appears to be doing well currently in regard to his wound. We have gotten approval for a repeat Apligraf treatment. I think that is going to be a good way to wrap this up I really feel like we will likely be able to get the wound closed with  the additional treatments that have been improved. 01-04-2022 upon evaluation patient appears to be doing excellent in regard to his wound. He is tolerating the dressing changes without complication and is measuring much better today as well. I am very pleased with where we stand. I am not even certain we will get have to use all 5 of the reapproved Apligraf but we will see how things go right now I am extremely pleased with the progress that is been made. 01-18-2022 upon evaluation today patient appears to be doing well with regard to his wound is actually measuring significantly better which is great news. This is still open but very minimally. I do think that it is appropriate to continue with the Apligraf he has done so well with this. Objective Constitutional Well-nourished and well-hydrated in no acute distress. Vitals Time Taken: 8:10 AM, Height: 71 in, Weight: 338 lbs, BMI: 47.1, Temperature: 98.3 F, Pulse: 91 bpm, Respiratory Rate:  18 breaths/min, Blood Pressure: 141/86 mmHg. Respiratory normal breathing without difficulty. Psychiatric this patient is able to make decisions and demonstrates good insight into disease process. Alert and Oriented x 3. pleasant and cooperative. General Notes: Upon inspection patient's wound bed actually showed signs of good granulation and epithelization at this point. Fortunately I do not see any evidence of infection locally or systemically which is great news and overall I am extremely pleased with where we stand today. Integumentary (Hair, Skin) Wound #1 status is Open. Original cause of wound was Gradually Appeared. The date acquired was: 11/18/2015. The wound has been in treatment 267 weeks. The wound is located on the Left,Lateral Lower Leg. The wound measures 1.8cm length x 1cm width x 0.1cm depth; 1.414cm^2 area and 0.141cm^3 volume. There is Fat Layer (Subcutaneous Tissue) exposed. There is a medium amount of serosanguineous drainage noted. There is  large (67-100%) red granulation within the wound bed. There is a small (1-33%) amount of necrotic tissue within the wound bed including Adherent Slough. 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 A skin graft procedure using a bioengineered skin substitute/cellular or tissue based product was performed by Tommie Sams., PA-C with the following instrument(s): Blade and Scissors. Apligraf was applied and secured with Steri-Strips. 33 sq cm of product was utilized and 11 sq cm was wasted. Post Application, bolus was applied. A Time Out was conducted at 08:36, prior to the start of the procedure. The procedure was tolerated well with a pain level of 0 throughout and a pain level of 0 following the procedure. ORLIE, CUNDARI ([id number]) Post procedure Diagnosis Wound #1: Same as Pre-Procedure . Pre-procedure diagnosis of Wound #1 is a Pyoderma located on the Left,Lateral Lower Leg . There was a Four Layer Compression Therapy Procedure with a pre-treatment ABI of 1.2 by Massie Kluver. Post procedure Diagnosis Wound #1: Same as Pre-Procedure Plan Follow-up Appointments: Return Appointment in 1 week. Nurse Visit as needed - twice a week Bathing/ Shower/ Hygiene: Clean wound with Normal Saline or wound cleanser. May shower with wound dressing protected with water repellent cover or cast protector. No tub bath. Cellular or Tissue Based Products: Wound #1 Left,Lateral Lower Leg: Cellular or Tissue Based Product applied to wound bed; including contact layer, fixation with steri-strips, dry gauze and cover dressing. (DO NOT REMOVE). - Apligraf #8  applied Edema Control - Lymphedema / Segmental Compressive Device / Other: Optional: One layer of unna paste to top of compression wrap (to act as an anchor). - He needs this to hold it up Elevate, Exercise Daily and Avoid Standing for Long Periods of Time. Elevate legs to the level of the heart and pump ankles as often as possible Elevate leg(s) parallel to the floor when sitting. WOUND #1: - Lower Leg Wound Laterality: Left, Lateral Cleanser: Wound Cleanser 3 x Per Week/30 Days Discharge Instructions: Wash your hands with soap and water. Remove old dressing, discard into plastic bag and place into trash. Cleanse the wound with Wound Cleanser prior to applying a clean dressing using gauze sponges, not tissues or cotton balls. Do not scrub or use excessive force. Pat dry using gauze sponges, not tissue or cotton balls. Peri-Wound Care: Desitin Maximum Strength Ointment 4 (oz) 3 x Per  Week/30 Days Discharge Instructions: Apply around wound edges to avoid maceration Peri-Wound Care: Triamcinolone Acetonide Cream, 0.1%, 15 (g) tube 3 x Per Week/30 Days Discharge Instructions: Apply to leg Topical: Clobetasol Propionate ointment 0.05%, 60 (g) tube 3 x Per Week/30 Days Discharge Instructions: Pt to bring to appt- applied to wound bed Primary Dressing: Apligraf 3 x Per Week/30 Days Secondary Dressing: (NON-BORDER) Zetuvit Plus Silicone NON-BORDER 5x5 (in/in) 3 x Per Week/30 Days Compression Wrap: Medichoice 4 layer Compression System, 35-40 mmHG 3 x Per Week/30 Days Discharge Instructions: Apply multi-layer wrap as directed. 1. I would recommend that we go ahead and continue with the wound care measures as before and the patient is in agreement with plan I did apply the Apligraf today which she tolerated without any complication. I am really pleased with how things are progressing. 2. I am also can recommend that we have the patient continue with the 4-layer compression wrap which I think has been  extremely beneficial for him. 3. I am also can recommend the patient should continue to monitor for any signs of infection obviously if anything changes he should let us know. We will see patient back for reevaluation in 1 week here in the clinic. If anything worsens or changes patient will contact our office for additional recommendations. Electronic Signature(s) Signed: 01/18/2022 8:48:33 AM By: Worthy Keeler PA-C Entered By: Worthy Keeler on 01/18/2022 08:48:32 ZACCHEUS, EDMISTER ([id number]) -------------------------------------------------------------------------------- SuperBill Details Patient Name: [name] Date of Service: 01/18/2022 Medical Record Number: [id number] Patient Account Number: [id number] Date of Birth/Sex: 05-28-1980 (40 y.o. M) Treating RN: [name] Primary Care Provider: [name] Other Clinician: Massie Kluver Referring Provider: [name] Treating Provider/Extender: [name] Cliche in Treatment: 267 Diagnosis Coding ICD-10 Codes Code Description I87.2 Venous insufficiency (chronic) (peripheral) L97.222 Non-pressure chronic ulcer of left calf with fat layer exposed E11.622 Type 2 diabetes mellitus with other skin ulcer L88 Pyoderma gangrenosum F17.208 Nicotine dependence, unspecified, with other nicotine-induced disorders Facility Procedures CPT4 Code: 56153794 Description: [phone] (Facility Use Only) Apligraf 44 SQ CM Modifier: Quantity: [place] Code: [id number] Description: 47340 - SKIN SUB GRAFT TRNK/ARM/LEG Modifier: Quantity: 1 CPT4 Code: Description: ICD-10 Diagnosis Description L97.222 Non-pressure chronic ulcer of left calf with fat layer exposed Modifier: Quantity: Physician Procedures CPT4 Code: 3709643 Description: 83818 - WC PHYS SKIN SUB GRAFT TRNK/ARM/LEG Modifier: Quantity: 1 CPT4 Code: Description: ICD-10 Diagnosis Description L97.222 Non-pressure chronic ulcer of left calf with fat layer  exposed Modifier: Quantity: Electronic Signature(s) Signed: 01/18/2022 8:48:45 AM By: Worthy Keeler PA-C Entered By: Worthy Keeler on 01/18/2022 08:48:45

## 2022-01-18 NOTE — Progress Notes (Signed)
[name] ([id number]) Visit Report for 01/16/2022 Physician Orders Details Patient Name: [name], [name] Date of Service: 01/16/2022 8:00 AM Medical Record Number: [id number] Patient Account Number: [id number] Date of Birth/Sex: [birth year] (41 y.o. Male) Treating RN: Carlene Coria Primary Care Provider: [name] Other Clinician: Referring Provider: [name] Treating Provider/Extender: Yaakov Guthrie in Treatment: [phone] Verbal / Phone Orders: No Diagnosis Coding Follow-up Appointments o Return Appointment in 1 week. o Nurse Visit as needed - twice a week Bathing/ Shower/ Hygiene o Clean wound with Normal Saline or wound cleanser. o May shower with wound dressing protected with water repellent cover or cast protector. o No tub bath. Cellular or Tissue Based Products Wound #1 Left,Lateral Lower Leg o Cellular or Tissue Based Product applied to wound bed; including contact layer, fixation with steri-strips, dry gauze and cover dressing. (DO NOT REMOVE). - Apligraf #7 applied Edema Control - Lymphedema / Segmental Compressive Device / Other o Optional: One layer of unna paste to top of compression wrap (to act as an anchor). - He needs this to hold it up o Elevate, Exercise Daily and Avoid Standing for Long Periods of Time. o Elevate legs to the level of the heart and pump ankles as often as possible o Elevate leg(s) parallel to the floor when sitting. Wound Treatment Wound #1 - Lower Leg Wound Laterality: Left, Lateral Cleanser: Wound Cleanser 3 x Per Week/30 Days Discharge Instructions: Wash your hands with soap and water. Remove old dressing, discard into plastic bag and place into trash. Cleanse the wound with Wound Cleanser prior to applying a clean dressing using gauze sponges, not tissues or cotton balls. Do not scrub or use excessive force. Pat dry using gauze sponges, not tissue or cotton balls. Peri-Wound Care: Desitin Maximum Strength  Ointment 4 (oz) 3 x Per Week/30 Days Discharge Instructions: Apply around wound edges to avoid maceration Peri-Wound Care: Triamcinolone Acetonide Cream, 0.1%, 15 (g) tube 3 x Per Week/30 Days Discharge Instructions: Apply to leg Topical: Clobetasol Propionate ointment 0.05%, 60 (g) tube 3 x Per Week/30 Days Discharge Instructions: Pt to bring to appt- applied to wound bed Primary Dressing: Apligraf 3 x Per Week/30 Days Secondary Dressing: (NON-BORDER) Zetuvit Plus Silicone NON-BORDER 5x5 (in/in) 3 x Per Week/30 Days Compression Wrap: Medichoice 4 layer Compression System, 35-40 mmHG 3 x Per Week/30 Days Discharge Instructions: Apply multi-layer wrap as directed. Electronic Signature(s) Signed: 01/16/2022 9:13:00 AM By: Kalman Shan DO Signed: 01/17/2022 8:44:03 PM By: Carlene Coria RN Entered By: Carlene Coria on 01/16/2022 08:30:59 ISAI, GOTTLIEB ([id number]) -------------------------------------------------------------------------------- SuperBill Details Patient Name: [name] Date of Service: 01/16/2022 Medical Record Number: [id number] Patient Account Number: [id number] Date of Birth/Sex: 03-Oct-1980 (41 y.o. Male) Treating RN: Carlene Coria Primary Care Provider: [name] Other Clinician: Referring Provider: [name] Treating Provider/Extender: Yaakov Guthrie in Treatment: 267 Diagnosis Coding ICD-10 Codes Code Description I87.2 Venous insufficiency (chronic) (peripheral) L97.222 Non-pressure chronic ulcer of left calf with fat layer exposed E11.622 Type 2 diabetes mellitus with other skin ulcer L88 Pyoderma gangrenosum F17.208 Nicotine dependence, unspecified, with other nicotine-induced disorders Facility Procedures CPT4 Code: 25498264 Description: (Facility Use Only) [place] LT LEG Modifier: Quantity: 1 Electronic Signature(s) Signed: 01/16/2022 9:13:00 AM By: Kalman Shan DO Signed: 01/17/2022 8:44:03 PM By: Carlene Coria RN Entered By: Carlene Coria on 01/16/2022 08:31:44

## 2022-01-18 NOTE — Progress Notes (Signed)
VIPUL, CAFARELLI ([id number]) Visit Report for 01/16/2022 Arrival Information Details Patient Name: [name], [name] Date of Service: 01/16/2022 8:00 AM Medical Record Number: [id number] Patient Account Number: [id number] Date of Birth/Sex: Dec 05, 1980 (40 y.o. Male) Treating RN: Carlene Coria Primary Care Carden Teel: [name] Other Clinician: Referring Lilymae Swiech: [name] Treating Alberto Pina/Extender: Yaakov Guthrie in Treatment: 52 Visit Information History Since Last Visit All ordered tests and consults were completed: No Patient Arrived: Ambulatory Added or deleted any medications: No Arrival Time: 08:00 Any new allergies or adverse reactions: No Accompanied By: self Had a fall or experienced change in No Transfer Assistance: None activities of daily living that may affect Patient Identification Verified: Yes risk of falls: Secondary Verification Process Completed: Yes Signs or symptoms of abuse/neglect since last visito No Patient Requires Transmission-Based No Hospitalized since last visit: No Precautions: Implantable device outside of the clinic excluding No Patient Has Alerts: Yes cellular tissue based products placed in the center Patient Alerts: Patient has reaction since last visit: to Has Dressing in Place as Prescribed: Yes silver dressings. Has Compression in Place as Prescribed: Yes Pain Present Now: No Electronic Signature(s) Signed: 01/17/2022 8:44:03 PM By: Carlene Coria RN Entered By: Carlene Coria on 01/16/2022 08:29:55 [name], [name] ([id number]) -------------------------------------------------------------------------------- Clinic Level of Care Assessment Details Patient Name: [name], [name] Date of Service: 01/16/2022 8:00 AM Medical Record Number: [id number] Patient Account Number: [id number] Date of Birth/Sex: 1981-04-06 (40 y.o. Male) Treating RN: Carlene Coria Primary Care Kameka Whan: [name] Other Clinician: Referring Crissa Sowder: [name] Treating Karia Ehresman/Extender: Yaakov Guthrie in Treatment: [facility] Level of Care Assessment Items TOOL 1 Quantity Score []  - Use when EandM and Procedure is performed on INITIAL visit 0 ASSESSMENTS - Nursing Assessment / Reassessment []  - General Physical Exam (combine w/ comprehensive assessment (listed just below) when performed on new 0 pt. evals) []  - 0 Comprehensive Assessment (HX, ROS, Risk Assessments, Wounds Hx, etc.) ASSESSMENTS - Wound and Skin Assessment / Reassessment []  - Dermatologic / Skin Assessment (not related to wound area) 0 ASSESSMENTS - Ostomy and/or Continence Assessment and Care []  - Incontinence Assessment and Management 0 []  - 0 Ostomy Care Assessment and Management (repouching, etc.) PROCESS - Coordination of Care []  - Simple Patient / Family Education for ongoing care 0 []  - 0 Complex (extensive) Patient / Family Education for ongoing care []  - 0 Staff obtains Programmer, systems, Records, Test Results / Process Orders []  - 0 Staff telephones HHA, Nursing Homes / Clarify orders / etc []  - 0 Routine Transfer to another Facility (non-emergent condition) []  - 0 Routine Hospital Admission (non-emergent condition) []  - 0 New Admissions / Biomedical engineer / Ordering NPWT, Apligraf, etc. []  - 0 Emergency Hospital Admission (emergent condition) PROCESS - Special Needs []  - Pediatric / Minor Patient Management 0 []  - 0 Isolation Patient Management []  - 0 Hearing / Language / Visual special needs []  - 0 Assessment of Community assistance (transportation, D/C planning, etc.) []  - 0 Additional assistance / Altered mentation []  - 0 Support Surface(s) Assessment (bed, cushion, seat, etc.) INTERVENTIONS - Miscellaneous []  - External ear exam 0 []  - 0 Patient Transfer (multiple staff / Civil Service fast streamer / Similar devices) []  - 0 Simple Staple / Suture removal (25 or less) []  - 0 Complex Staple / Suture removal (26 or more) []  -  0 Hypo/Hyperglycemic Management (do not check if billed separately) []  - 0 Ankle / Brachial Index (ABI) - do not check if billed separately Has the patient been seen at the  hospital within the last three years: Yes Total Score: 0 Level Of Care: ____ [name] ([id number]) Electronic Signature(s) Signed: 01/17/2022 8:44:03 PM By: Carlene Coria RN Entered By: Carlene Coria on 01/16/2022 08:31:33 [name], [name] ([id number]) -------------------------------------------------------------------------------- Compression Therapy Details Patient Name: [name] Date of Service: 01/16/2022 8:00 AM Medical Record Number: [id number] Patient Account Number: [id number] Date of Birth/Sex: [birth year] (40 y.o. Male) Treating RN: Carlene Coria Primary Care Yeimy Brabant: [name] Other Clinician: Referring Kipling Graser: [name] Treating Danniel Grenz/Extender: Yaakov Guthrie in Treatment: 267 Compression Therapy Performed for Wound Assessment: Wound #1 Left,Lateral Lower Leg Performed By: Clinician Carlene Coria, RN Compression Type: Four Layer Electronic Signature(s) Signed: 01/17/2022 8:44:03 PM By: Carlene Coria RN Entered By: Carlene Coria on 01/16/2022 08:30:41 [name], [name] ([id number]) -------------------------------------------------------------------------------- Encounter Discharge Information Details Patient Name: [name] Date of Service: 01/16/2022 8:00 AM Medical Record Number: [id number] Patient Account Number: [id number] Date of Birth/Sex: [birth year] (40 y.o. Male) Treating RN: Carlene Coria Primary Care Aitan Rossbach: [name] Other Clinician: Referring Rane Dumm: [name] Treating Aveon Colquhoun/Extender: Yaakov Guthrie in Treatment: 267 Encounter Discharge Information Items Discharge Condition: Stable Ambulatory Status: Ambulatory Discharge Destination: Home Transportation: Private Auto Accompanied By: self Schedule Follow-up Appointment: Yes Clinical  Summary of Care: Electronic Signature(s) Signed: 01/17/2022 8:44:03 PM By: Carlene Coria RN Entered By: Carlene Coria on 01/16/2022 08:31:25 [name], [name] ([id number]) -------------------------------------------------------------------------------- Wound Assessment Details Patient Name: [name] Date of Service: 01/16/2022 8:00 AM Medical Record Number: [id number] Patient Account Number: [id number] Date of Birth/Sex: February 09, 1981 (41 y.o. Male) Treating RN: Carlene Coria Primary Care Maecyn Panning: [name] Other Clinician: Referring Malikah Principato: [name] Treating Tearra Ouk/Extender: Yaakov Guthrie in Treatment: 267 Wound Status Wound Number: 1 Primary Etiology: Pyoderma Wound Location: Left, Lateral Lower Leg Wound Status: Open Wounding Event: Gradually Appeared Comorbid History: Sleep Apnea, Hypertension, Colitis Date Acquired: 11/18/2015 Weeks Of Treatment: 267 Clustered Wound: No Wound Measurements Length: (cm) 1.9 Width: (cm) 1.9 Depth: (cm) 0.1 Area: (cm) 2.835 Volume: (cm) 0.284 % Reduction in Area: 42.2% % Reduction in Volume: 92.8% Epithelialization: Medium (34-66%) Tunneling: No Undermining: No Wound Description Classification: Full Thickness With Exposed Support Structures Exudate Amount: Medium Exudate Type: Serosanguineous Exudate Color: red, brown Foul Odor After Cleansing: No Slough/Fibrino No Wound Bed Granulation Amount: Large (67-100%) Exposed Structure Granulation Quality: Red Fascia Exposed: No Necrotic Amount: Small (1-33%) Fat Layer (Subcutaneous Tissue) Exposed: Yes Necrotic Quality: Adherent Slough Tendon Exposed: No Muscle Exposed: No Joint Exposed: No Bone Exposed: No Treatment Notes Wound #1 (Lower Leg) Wound Laterality: Left, Lateral Cleanser Wound Cleanser Discharge Instruction: Wash your hands with soap and water. Remove old dressing, discard into plastic bag and place into trash. Cleanse the wound with Wound Cleanser  prior to applying a clean dressing using gauze sponges, not tissues or cotton balls. Do not scrub or use excessive force. Pat dry using gauze sponges, not tissue or cotton balls. Peri-Wound Care Desitin Maximum Strength Ointment 4 (oz) Discharge Instruction: Apply around wound edges to avoid maceration Triamcinolone Acetonide Cream, 0.1%, 15 (g) tube Discharge Instruction: Apply to leg Topical Clobetasol Propionate ointment 0.05%, 60 (g) tube Discharge Instruction: Pt to bring to appt- applied to wound bed Primary Dressing Apligraf Secondary Dressing (NON-BORDER) Zetuvit Plus Silicone NON-BORDER 5x5 (in/in) [name], [name] ([id number]) Secured With Compression Wrap Medichoice 4 layer Compression System, 35-40 mmHG Discharge Instruction: Apply multi-layer wrap as directed. Compression Stockings Add-Ons Electronic Signature(s) Signed: 01/17/2022 8:44:03 PM By: Carlene Coria RN Entered By: Carlene Coria on 01/16/2022 08:30:20

## 2022-01-22 DIAGNOSIS — E11622 Type 2 diabetes mellitus with other skin ulcer: Secondary | ICD-10-CM | POA: Diagnosis not present

## 2022-01-22 NOTE — Progress Notes (Signed)
ISAMI, MEHRA ([id number]) Visit Report for 01/22/2022 Arrival Information Details Patient Name: [name], [name] Date of Service: 01/22/2022 8:00 AM Medical Record Number: [id number] Patient Account Number: [id number] Date of Birth/Sex: [birth year] (41 y.o. M) Treating RN: [name] Primary Care Dennisha Mouser: [name] Other Clinician: Referring Brysyn Brandenberger: [name] Treating Dyana Magner/Extender: [name] Cliche in Treatment: 25 Visit Information History Since Last Visit Added or deleted any medications: No Patient Arrived: Ambulatory Has Dressing in Place as Prescribed: Yes Arrival Time: 08:26 Has Compression in Place as Prescribed: Yes Accompanied By: self Pain Present Now: No Transfer Assistance: None Patient Identification Verified: Yes Secondary Verification Process Completed: Yes Patient Requires Transmission-Based No Precautions: Patient Has Alerts: Yes Patient Alerts: Patient has reaction to silver dressings. Electronic Signature(s) Signed: 01/22/2022 4:48:34 PM By: [name], [name], RN, [name], [name], [name] Entered By: [name], [name], RN, [name], [name] on 01/22/2022 08:27:20 [name], [name] ([id number]) -------------------------------------------------------------------------------- Compression Therapy Details Patient Name: [name] Date of Service: 01/22/2022 8:00 AM Medical Record Number: [id number] Patient Account Number: [id number] Date of Birth/Sex: August 14, 1980 (41 y.o. M) Treating RN: [name] Primary Care Panda Crossin: [name] Other Clinician: Referring Damarcus Reggio: [name] Treating Clela Hagadorn/Extender: [name] Cliche in Treatment: 267 Compression Therapy Performed for Wound Assessment: Wound #1 Left,Lateral Lower Leg Performed By: Charlesetta Garibaldi, RN Compression Type: Four Layer Pre Treatment ABI: 1.2 Notes Patient tolerating wraps well. Electronic Signature(s) Signed: 01/22/2022 4:48:34 PM By: [name], [name], RN, [name], [name], [name] Entered By: [name], [name], RN,  [name], [name] on 01/22/2022 20:94:70 [name], [name] ([id number]) -------------------------------------------------------------------------------- Encounter Discharge Information Details Patient Name: [name] Date of Service: 01/22/2022 8:00 AM Medical Record Number: [id number] Patient Account Number: [id number] Date of Birth/Sex: 01-26-1981 (41 y.o. M) Treating RN: [name] Primary Care Crysten Kaman: [name] Other Clinician: Referring Bayani Renteria: [name] Treating Tashon Capp/Extender: [name] Cliche in Treatment: 267 Encounter Discharge Information Items Discharge Condition: Stable Ambulatory Status: Ambulatory Discharge Destination: Home Transportation: Private Auto Accompanied By: self Schedule Follow-up Appointment: Yes Clinical Summary of Care: Electronic Signature(s) Signed: 01/22/2022 4:48:34 PM By: [name], [name], RN, [name], [name], [name] Entered By: [name], [name], RN, [name], [name] on 01/22/2022 08:29:51 [name] ([id number]) -------------------------------------------------------------------------------- Wound Assessment Details Patient Name: [name] Date of Service: 01/22/2022 8:00 AM Medical Record Number: [id number] Patient Account Number: [id number] Date of Birth/Sex: Oct 17, 1980 (41 y.o. M) Treating RN: [name] Primary Care Kynzie Polgar: [name] Other Clinician: Referring Hondo Nanda: [name] Treating [name]/Extender: [name] Cliche in Treatment: 267 Wound Status Wound Number: 1 Primary Etiology: Pyoderma Wound Location: Left, Lateral Lower Leg Wound Status: Open Wounding Event: Gradually Appeared Comorbid History: Sleep Apnea, Hypertension, Colitis Date Acquired: 11/18/2015 Weeks Of Treatment: 267 Clustered Wound: No Wound Measurements Length: (cm) 1.8 Width: (cm) 1 Depth: (cm) 0.1 Area: (cm) 1.414 Volume: (cm) 0.141 % Reduction in Area: 71.2% % Reduction in Volume: 96.4% Epithelialization: Medium (34-66%) Tunneling: No Undermining:  No Wound Description Classification: Full Thickness With Exposed Support Structures Exudate Amount: Medium Exudate Type: Serosanguineous Exudate Color: red, brown Foul Odor After Cleansing: No Slough/Fibrino No Wound Bed Granulation Amount: Large (67-100%) Exposed Structure Granulation Quality: Red Fascia Exposed: No Necrotic Amount: Small (1-33%) Fat Layer (Subcutaneous Tissue) Exposed: Yes Necrotic Quality: Adherent Slough Tendon Exposed: No Muscle Exposed: No Joint Exposed: No Bone Exposed: No Assessment Notes Apligraf on wound, unable to assess. Treatment Notes Wound #1 (Lower Leg) Wound Laterality: Left, Lateral Cleanser Wound Cleanser Discharge Instruction: Wash your hands with soap and water. Remove old dressing, discard into plastic bag and place into  trash. Cleanse the wound with Wound Cleanser prior to applying a clean dressing using gauze sponges, not tissues or cotton balls. Do not scrub or use excessive force. Pat dry using gauze sponges, not tissue or cotton balls. Peri-Wound Care Desitin Maximum Strength Ointment 4 (oz) Discharge Instruction: Apply around wound edges to avoid maceration Triamcinolone Acetonide Cream, 0.1%, 15 (g) tube Discharge Instruction: Apply to leg Topical Clobetasol Propionate ointment 0.05%, 60 (g) tube Discharge Instruction: Pt to bring to appt- applied to wound bed Primary Dressing Feider, [name] ([id number]) Apligraf Secondary Dressing (NON-BORDER) Zetuvit Plus Silicone NON-BORDER 5x5 (in/in) Secured With Compression Wrap Medichoice 4 layer Compression System, 35-40 mmHG Discharge Instruction: Apply multi-layer wrap as directed. Compression Stockings Add-Ons Electronic Signature(s) Signed: 01/22/2022 4:48:34 PM By: [name], [name], RN, [name], [name], [name] Entered By: [name], [name], RN, [name], [name] on 01/22/2022 79:90:94

## 2022-01-25 DIAGNOSIS — E11622 Type 2 diabetes mellitus with other skin ulcer: Secondary | ICD-10-CM | POA: Diagnosis not present

## 2022-01-25 NOTE — Progress Notes (Signed)
ARIEH, BOGUE ([id number]) Visit Report for 01/25/2022 Physician Orders Details Patient Name: [name], [name] Date of Service: 01/25/2022 8:15 AM Medical Record Number: [id number] Patient Account Number: [id number] Date of Birth/Sex: 20-Oct-1980 (41 y.o. M) Treating RN: [name] Primary Care Provider: [name] Other Clinician: Massie Kluver Referring Provider: [name] Treating Provider/Extender: [name] Cliche in Treatment: 268 Verbal / Phone Orders: No Diagnosis Coding Follow-up Appointments o Return Appointment in 1 week. o Nurse Visit as needed - twice a week Bathing/ Shower/ Hygiene o Clean wound with Normal Saline or wound cleanser. o May shower with wound dressing protected with water repellent cover or cast protector. o No tub bath. Cellular or Tissue Based Products Wound #1 Left,Lateral Lower Leg o Cellular or Tissue Based Product applied to wound bed; including contact layer, fixation with steri-strips, dry gauze and cover dressing. (DO NOT REMOVE). - Apligraf #8 applied Edema Control - Lymphedema / Segmental Compressive Device / Other o Optional: One layer of unna paste to top of compression wrap (to act as an anchor). - He needs this to hold it up o Elevate, Exercise Daily and Avoid Standing for Long Periods of Time. o Elevate legs to the level of the heart and pump ankles as often as possible o Elevate leg(s) parallel to the floor when sitting. Wound Treatment Wound #1 - Lower Leg Wound Laterality: Left, Lateral Cleanser: Wound Cleanser 3 x Per Week/30 Days Discharge Instructions: Wash your hands with soap and water. Remove old dressing, discard into plastic bag and place into trash. Cleanse the wound with Wound Cleanser prior to applying a clean dressing using gauze sponges, not tissues or cotton balls. Do not scrub or use excessive force. Pat dry using gauze sponges, not tissue or cotton balls. Peri-Wound Care: Desitin Maximum  Strength Ointment 4 (oz) 3 x Per Week/30 Days Discharge Instructions: Apply around wound edges to avoid maceration Peri-Wound Care: Triamcinolone Acetonide Cream, 0.1%, 15 (g) tube 3 x Per Week/30 Days Discharge Instructions: Apply to leg Topical: Clobetasol Propionate ointment 0.05%, 60 (g) tube 3 x Per Week/30 Days Discharge Instructions: Pt to bring to appt- applied to wound bed Primary Dressing: Apligraf 3 x Per Week/30 Days Secondary Dressing: (NON-BORDER) Zetuvit Plus Silicone NON-BORDER 5x5 (in/in) 3 x Per Week/30 Days Compression Wrap: Medichoice 4 layer Compression System, 35-40 mmHG 3 x Per Week/30 Days Discharge Instructions: Apply multi-layer wrap as directed. Electronic Signature(s) Signed: 01/25/2022 12:29:30 PM By: Worthy Keeler PA-C Signed: 01/25/2022 12:49:02 PM By: Massie Kluver Entered By: Massie Kluver on 01/25/2022 08:26:34 [name] ([id number]) -------------------------------------------------------------------------------- SuperBill Details Patient Name: [name] Date of Service: 01/25/2022 Medical Record Number: [id number] Patient Account Number: [id number] Date of Birth/Sex: 07-Jul-1980 (41 y.o. 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

## 2022-01-25 NOTE — Progress Notes (Signed)
RUBEL, HECKARD ([id number]) Visit Report for 01/25/2022 Arrival Information Details Patient Name: [name], [name] Date of Service: 01/25/2022 8:15 AM Medical Record Number: [id number] Patient Account Number: [id number] Date of Birth/Sex: 09-May-1981 (41 y.o. M) Treating RN: [name] Primary Care Verdie Wilms: [name] Other Clinician: Massie Kluver Referring Rafaella Kole: [name] Treating Gustie Bobb/Extender: [name] Cliche in Treatment: 268 Visit Information History Since Last Visit All ordered tests and consults were completed: No Patient Arrived: Ambulatory Added or deleted any medications: No Arrival Time: 08:25 Any new allergies or adverse reactions: No Transfer Assistance: None Had a fall or experienced change in No Patient Requires Transmission-Based No activities of daily living that may affect Precautions: risk of falls: Patient Has Alerts: Yes Hospitalized since last visit: No Patient Alerts: Patient has reaction Pain Present Now: No to silver dressings. Electronic Signature(s) Signed: 01/25/2022 12:49:02 PM By: Massie Kluver Entered By: Massie Kluver on 01/25/2022 08:25:30 [name] ([id number]) -------------------------------------------------------------------------------- Clinic Level of Care Assessment Details Patient Name: [name] Date of Service: 01/25/2022 8:15 AM Medical Record Number: [id number] Patient Account Number: [id number] Date of Birth/Sex: 17-Jun-1980 (41 y.o. M) Treating RN: [name] Primary Care Tevion Laforge: [name] Other Clinician: Massie Kluver Referring Crosley Stejskal: [name] Treating Reneshia Zuccaro/Extender: [name] Cliche in Treatment: 268 Clinic Level of Care Assessment Items TOOL 1 Quantity Score []  - Use when EandM and Procedure is performed on INITIAL visit 0 ASSESSMENTS - Nursing Assessment / Reassessment []  - General Physical Exam (combine w/ comprehensive assessment (listed just below) when performed on  new 0 pt. evals) []  - 0 Comprehensive Assessment (HX, ROS, Risk Assessments, Wounds Hx, etc.) ASSESSMENTS - Wound and Skin Assessment / Reassessment []  - Dermatologic / Skin Assessment (not related to wound area) 0 ASSESSMENTS - Ostomy and/or Continence Assessment and Care []  - Incontinence Assessment and Management 0 []  - 0 Ostomy Care Assessment and Management (repouching, etc.) PROCESS - Coordination of Care []  - Simple Patient / Family Education for ongoing care 0 []  - 0 Complex (extensive) Patient / Family Education for ongoing care []  - 0 Staff obtains Programmer, systems, Records, Test Results / Process Orders []  - 0 Staff telephones HHA, Nursing Homes / Clarify orders / etc []  - 0 Routine Transfer to another Facility (non-emergent condition) []  - 0 Routine Hospital Admission (non-emergent condition) []  - 0 New Admissions / Biomedical engineer / Ordering NPWT, Apligraf, etc. []  - 0 Emergency Hospital Admission (emergent condition) PROCESS - Special Needs []  - Pediatric / Minor Patient Management 0 []  - 0 Isolation Patient Management []  - 0 Hearing / Language / Visual special needs []  - 0 Assessment of Community assistance (transportation, D/C planning, etc.) []  - 0 Additional assistance / Altered mentation []  - 0 Support Surface(s) Assessment (bed, cushion, seat, etc.) INTERVENTIONS - Miscellaneous []  - External ear exam 0 []  - 0 Patient Transfer (multiple staff / Civil Service fast streamer / Similar devices) []  - 0 Simple Staple / Suture removal (25 or less) []  - 0 Complex Staple / Suture removal (26 or more) []  - 0 Hypo/Hyperglycemic Management (do not check if billed separately) []  - 0 Ankle / Brachial Index (ABI) - do not check if billed separately Has the patient been seen at the hospital within the last three years: Yes Total Score: 0 Level Of Care: ____ [name] ([id number]) Electronic Signature(s) Signed: 01/25/2022 12:49:02 PM By: Massie Kluver Entered By:  Massie Kluver on 01/25/2022 08:27:03 [name] ([id number]) -------------------------------------------------------------------------------- Compression Therapy Details Patient Name: [name] Date of Service: 01/25/2022 8:15 AM Medical  Record Number: [id number] Patient Account Number: [id number] Date of Birth/Sex: [birth year] (41 y.o. M) Treating RN: [name] Primary Care Cleta Heatley: [name] Other Clinician: Massie Kluver Referring Nyles Mitton: [name] Treating Brenson Hartman/Extender: Jeri Cos Weeks in Treatment: 268 Compression Therapy Performed for Wound Assessment: Wound #1 Left,Lateral Lower Leg Performed By: Lenice Pressman, Angie, Compression Type: Four Layer Pre Treatment ABI: 1.2 Electronic Signature(s) Signed: 01/25/2022 12:49:02 PM By: Massie Kluver Entered By: Massie Kluver on 01/25/2022 08:26:18 [name] ([id number]) -------------------------------------------------------------------------------- Encounter Discharge Information Details Patient Name: [name] Date of Service: 01/25/2022 8:15 AM Medical Record Number: [id number] Patient Account Number: [id number] Date of Birth/Sex: 01-Aug-1980 (41 y.o. M) Treating RN: [name] Primary Care Tajay Muzzy: [name] Other Clinician: Massie Kluver Referring Kento Gossman: [name] Treating Khushboo Chuck/Extender: [name] Cliche in Treatment: 268 Encounter Discharge Information Items Discharge Condition: Stable Ambulatory Status: Ambulatory Discharge Destination: Home Transportation: Private Auto Accompanied By: self Schedule Follow-up Appointment: Yes Clinical Summary of Care: Electronic Signature(s) Signed: 01/25/2022 12:49:02 PM By: Massie Kluver Entered By: Massie Kluver on 01/25/2022 08:26:57 [name] ([id number]) -------------------------------------------------------------------------------- Wound Assessment Details Patient Name: [name] Date of Service: 01/25/2022 8:15  AM Medical Record Number: [id number] Patient Account Number: [id number] Date of Birth/Sex: 1980-09-06 (41 y.o. M) Treating RN: [name] Primary Care Phung Kotas: [name] Other Clinician: Massie Kluver Referring Joni Colegrove: [name] Treating Jakyren Fluegge/Extender: [name] Cliche in Treatment: 268 Wound Status Wound Number: 1 Primary Etiology: Pyoderma Wound Location: Left, Lateral Lower Leg Wound Status: Open Wounding Event: Gradually Appeared Comorbid History: Sleep Apnea, Hypertension, Colitis Date Acquired: 11/18/2015 Weeks Of Treatment: 268 Clustered Wound: No Wound Measurements Length: (cm) 1.8 Width: (cm) 1 Depth: (cm) 0.1 Area: (cm) 1.414 Volume: (cm) 0.141 % Reduction in Area: 71.2% % Reduction in Volume: 96.4% Epithelialization: Medium (34-66%) Wound Description Classification: Full Thickness With Exposed Support Structures Exudate Amount: Medium Exudate Type: Serosanguineous Exudate Color: red, brown Foul Odor After Cleansing: No Slough/Fibrino No Wound Bed Granulation Amount: Large (67-100%) Exposed Structure Granulation Quality: Red Fascia Exposed: No Necrotic Amount: Small (1-33%) Fat Layer (Subcutaneous Tissue) Exposed: Yes Necrotic Quality: Adherent Slough Tendon Exposed: No Muscle Exposed: No Joint Exposed: No Bone Exposed: No Treatment Notes Wound #1 (Lower Leg) Wound Laterality: Left, Lateral Cleanser Wound Cleanser Discharge Instruction: Wash your hands with soap and water. Remove old dressing, discard into plastic bag and place into trash. Cleanse the wound with Wound Cleanser prior to applying a clean dressing using gauze sponges, not tissues or cotton balls. Do not scrub or use excessive force. Pat dry using gauze sponges, not tissue or cotton balls. Peri-Wound Care Desitin Maximum Strength Ointment 4 (oz) Discharge Instruction: Apply around wound edges to avoid maceration Triamcinolone Acetonide Cream, 0.1%, 15 (g)  tube Discharge Instruction: Apply to leg Topical Clobetasol Propionate ointment 0.05%, 60 (g) tube Discharge Instruction: Pt to bring to appt- applied to wound bed Primary Dressing Apligraf Secondary Dressing (NON-BORDER) Zetuvit Plus Silicone NON-BORDER 5x5 (in/in) Vallecillo, Wilfredo ([id number]) Secured With Compression Wrap Medichoice 4 layer Compression System, 35-40 mmHG Discharge Instruction: Apply multi-layer wrap as directed. Compression Stockings Add-Ons Electronic Signature(s) Signed: 01/25/2022 12:49:02 PM By: Massie Kluver Signed: 01/25/2022 1:46:23 PM By: [name], [name], RN, [name], [name], [name] Entered By: Massie Kluver on 01/25/2022 08:25:49

## 2022-01-28 DIAGNOSIS — E11622 Type 2 diabetes mellitus with other skin ulcer: Secondary | ICD-10-CM | POA: Diagnosis not present

## 2022-01-30 DIAGNOSIS — E11622 Type 2 diabetes mellitus with other skin ulcer: Secondary | ICD-10-CM | POA: Diagnosis not present

## 2022-01-30 NOTE — Progress Notes (Signed)
BAXTER, GONZALEZ ([id number]) Visit Report for 01/28/2022 Arrival Information Details Patient Name: [name], [name] Date of Service: 01/28/2022 8:00 AM Medical Record Number: [id number] Patient Account Number: [id number] Date of Birth/Sex: 29-May-1980 (41 y.o. M) Treating RN: [name] Primary Care Mazey Mantell: [name] Other Clinician: Referring Aydrien Froman: [name] Treating Darell Saputo/Extender: [name] Cliche in Treatment: 268 Visit Information History Since Last Visit All ordered tests and consults were completed: No Patient Arrived: Ambulatory Added or deleted any medications: No Arrival Time: 08:07 Any new allergies or adverse reactions: No Transfer Assistance: None Had a fall or experienced change in No Patient Requires Transmission-Based No activities of daily living that may affect Precautions: risk of falls: Patient Has Alerts: Yes Hospitalized since last visit: No Patient Alerts: Patient has reaction Pain Present Now: No to silver dressings. Electronic Signature(s) Signed: 01/30/2022 5:04:43 PM By: Massie Kluver Entered By: Massie Kluver on 01/28/2022 08:07:35 [name] ([id number]) -------------------------------------------------------------------------------- Clinic Level of Care Assessment Details Patient Name: [name], [name] Date of Service: 01/28/2022 8:00 AM Medical Record Number: [id number] Patient Account Number: [id number] Date of Birth/Sex: [birth year] (40 y.o. M) Treating RN: [name] Primary Care Selenia Mihok: [name] Other Clinician: Referring Emiliya Chretien: [name] Treating Asyah Candler/Extender: [name] Cliche in Treatment: 268 Clinic Level of Care Assessment Items TOOL 1 Quantity Score []  - Use when EandM and Procedure is performed on INITIAL visit 0 ASSESSMENTS - Nursing Assessment / Reassessment []  - General Physical Exam (combine w/ comprehensive assessment (listed just below) when performed on new 0 pt. evals) []  -  0 Comprehensive Assessment (HX, ROS, Risk Assessments, Wounds Hx, etc.) ASSESSMENTS - Wound and Skin Assessment / Reassessment []  - Dermatologic / Skin Assessment (not related to wound area) 0 ASSESSMENTS - Ostomy and/or Continence Assessment and Care []  - Incontinence Assessment and Management 0 []  - 0 Ostomy Care Assessment and Management (repouching, etc.) PROCESS - Coordination of Care []  - Simple Patient / Family Education for ongoing care 0 []  - 0 Complex (extensive) Patient / Family Education for ongoing care []  - 0 Staff obtains Consents, Records, Test Results / Process Orders []  - 0 Staff telephones HHA, Nursing Homes / Clarify orders / etc []  - 0 Routine Transfer to another Facility (non-emergent condition) []  - 0 Routine Hospital Admission (non-emergent condition) []  - 0 New Admissions / Biomedical engineer / Ordering NPWT, Apligraf, etc. []  - 0 Emergency Hospital Admission (emergent condition) PROCESS - Special Needs []  - Pediatric / Minor Patient Management 0 []  - 0 Isolation Patient Management []  - 0 Hearing / Language / Visual special needs []  - 0 Assessment of Community assistance (transportation, D/C planning, etc.) []  - 0 Additional assistance / Altered mentation []  - 0 Support Surface(s) Assessment (bed, cushion, seat, etc.) INTERVENTIONS - Miscellaneous []  - External ear exam 0 []  - 0 Patient Transfer (multiple staff / Civil Service fast streamer / Similar devices) []  - 0 Simple Staple / Suture removal (25 or less) []  - 0 Complex Staple / Suture removal (26 or more) []  - 0 Hypo/Hyperglycemic Management (do not check if billed separately) []  - 0 Ankle / Brachial Index (ABI) - do not check if billed separately Has the patient been seen at the hospital within the last three years: Yes Total Score: 0 Level Of Care: ____ [name] ([id number]) Electronic Signature(s) Signed: 01/30/2022 5:04:43 PM By: Massie Kluver Entered By: Massie Kluver on 01/28/2022  08:30:14 [name] ([id number]) -------------------------------------------------------------------------------- Compression Therapy Details Patient Name: [name] Date of Service: 01/28/2022 8:00 AM Medical Record Number: [id number] Patient  Account Number: [id number] Date of Birth/Sex: 01/09/1981 (41 y.o. M) Treating RN: [name] Primary Care Joshawa Dubin: [name] Other Clinician: Referring Orris Perin: [name] Treating Tyronn Golda/Extender: Jeri Cos Weeks in Treatment: 268 Compression Therapy Performed for Wound Assessment: Wound #1 Left,Lateral Lower Leg Performed By: Lenice Pressman, Angie, Compression Type: Four Layer Pre Treatment ABI: 1.2 Electronic Signature(s) Signed: 01/30/2022 5:04:43 PM By: Massie Kluver Entered By: Massie Kluver on 01/28/2022 08:08:38 [name] ([id number]) -------------------------------------------------------------------------------- Encounter Discharge Information Details Patient Name: [name] Date of Service: 01/28/2022 8:00 AM Medical Record Number: [id number] Patient Account Number: [id number] Date of Birth/Sex: [birth year] (41 y.o. M) Treating RN: [name] Primary Care Esmond Hinch: [name] Other Clinician: Referring Elexia Friedt: [name] Treating Aidan Moten/Extender: [name] Cliche in Treatment: 268 Encounter Discharge Information Items Discharge Condition: Stable Ambulatory Status: Ambulatory Discharge Destination: Home Transportation: Private Auto Accompanied By: self Schedule Follow-up Appointment: Yes Clinical Summary of Care: Electronic Signature(s) Signed: 01/30/2022 5:04:43 PM By: Massie Kluver Entered By: Massie Kluver on 01/28/2022 08:30:07 [name] ([id number]) -------------------------------------------------------------------------------- Wound Assessment Details Patient Name: [name] Date of Service: 01/28/2022 8:00 AM Medical Record Number: [id number] Patient Account  Number: [id number] Date of Birth/Sex: 07/02/1980 (41 y.o. M) Treating RN: [name] Primary Care Veona Bittman: [name] Other Clinician: Referring Fredric Slabach: [name] Treating Minaal Struckman/Extender: [name] Cliche in Treatment: 268 Wound Status Wound Number: 1 Primary Etiology: Pyoderma Wound Location: Left, Lateral Lower Leg Wound Status: Open Wounding Event: Gradually Appeared Comorbid History: Sleep Apnea, Hypertension, Colitis Date Acquired: 11/18/2015 Weeks Of Treatment: 268 Clustered Wound: No Wound Measurements Length: (cm) 1.8 Width: (cm) 1 Depth: (cm) 0.1 Area: (cm) 1.414 Volume: (cm) 0.141 % Reduction in Area: 71.2% % Reduction in Volume: 96.4% Epithelialization: Medium (34-66%) Wound Description Classification: Full Thickness With Exposed Support Structures Exudate Amount: Medium Exudate Type: Serosanguineous Exudate Color: red, brown Foul Odor After Cleansing: No Slough/Fibrino No Wound Bed Granulation Amount: Large (67-100%) Exposed Structure Granulation Quality: Red Fascia Exposed: No Necrotic Amount: Small (1-33%) Fat Layer (Subcutaneous Tissue) Exposed: Yes Necrotic Quality: Adherent Slough Tendon Exposed: No Muscle Exposed: No Joint Exposed: No Bone Exposed: No Electronic Signature(s) Signed: 01/28/2022 5:08:52 PM By: [name], [name], RN, [name], [name], [name] Signed: 01/30/2022 5:04:43 PM By: Massie Kluver Entered By: Massie Kluver on 01/28/2022 08:07:57

## 2022-01-30 NOTE — Progress Notes (Signed)
DHILLON, COMUNALE ([id number]) Visit Report for 01/28/2022 Physician Orders Details Patient Name: [name], [name] Date of Service: 01/28/2022 8:00 AM Medical Record Number: [id number] Patient Account Number: [id number] Date of Birth/Sex: July 13, 1980 (41 y.o. M) Treating RN: [name] Primary Care Provider: [name] Other Clinician: Referring Provider: [name] Treating Provider/Extender: [name] Cliche in Treatment: [phone] Verbal / Phone Orders: No Diagnosis Coding Follow-up Appointments o Return Appointment in 1 week. o Nurse Visit as needed - twice a week Bathing/ Shower/ Hygiene o Clean wound with Normal Saline or wound cleanser. o May shower with wound dressing protected with water repellent cover or cast protector. o No tub bath. Cellular or Tissue Based Products Wound #1 Left,Lateral Lower Leg o Cellular or Tissue Based Product applied to wound bed; including contact layer, fixation with steri-strips, dry gauze and cover dressing. (DO NOT REMOVE). - Apligraf #8 applied Edema Control - Lymphedema / Segmental Compressive Device / Other o Optional: One layer of unna paste to top of compression wrap (to act as an anchor). - He needs this to hold it up o Elevate, Exercise Daily and Avoid Standing for Long Periods of Time. o Elevate legs to the level of the heart and pump ankles as often as possible o Elevate leg(s) parallel to the floor when sitting. Wound Treatment Wound #1 - Lower Leg Wound Laterality: Left, Lateral Cleanser: Wound Cleanser 3 x Per Week/30 Days Discharge Instructions: Wash your hands with soap and water. Remove old dressing, discard into plastic bag and place into trash. Cleanse the wound with Wound Cleanser prior to applying a clean dressing using gauze sponges, not tissues or cotton balls. Do not scrub or use excessive force. Pat dry using gauze sponges, not tissue or cotton balls. Peri-Wound Care: Desitin Maximum Strength Ointment 4  (oz) 3 x Per Week/30 Days Discharge Instructions: Apply around wound edges to avoid maceration Peri-Wound Care: Triamcinolone Acetonide Cream, 0.1%, 15 (g) tube 3 x Per Week/30 Days Discharge Instructions: Apply to leg Topical: Clobetasol Propionate ointment 0.05%, 60 (g) tube 3 x Per Week/30 Days Discharge Instructions: Pt to bring to appt- applied to wound bed Primary Dressing: Apligraf 3 x Per Week/30 Days Secondary Dressing: (NON-BORDER) Zetuvit Plus Silicone NON-BORDER 5x5 (in/in) 3 x Per Week/30 Days Compression Wrap: Medichoice 4 layer Compression System, 35-40 mmHG 3 x Per Week/30 Days Discharge Instructions: Apply multi-layer wrap as directed. Electronic Signature(s) Signed: 01/28/2022 8:31:47 AM By: Worthy Keeler PA-C Signed: 01/30/2022 5:04:43 PM By: Massie Kluver Entered By: Massie Kluver on 01/28/2022 08:09:04 [name] ([id number]) -------------------------------------------------------------------------------- SuperBill Details Patient Name: [name] Date of Service: 01/28/2022 Medical Record Number: [id number] Patient Account Number: [id number] Date of Birth/Sex: Nov 09, 1980 (41 y.o. 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

## 2022-01-31 NOTE — Progress Notes (Signed)
LEROY, PETTWAY ([id number]) Visit Report for 01/30/2022 Arrival Information Details Patient Name: [name], [name] Date of Service: 01/30/2022 8:00 AM Medical Record Number: [id number] Patient Account Number: [id number] Date of Birth/Sex: 15-Oct-1980 (41 y.o. M) Treating RN: [name] Primary Care Theoplis Garciagarcia: [name] Other Clinician: Referring Chiyoko Torrico: [name] Treating Imanie Darrow/Extender: Yaakov Guthrie in Treatment: 47 Visit Information History Since Last Visit Added or deleted any medications: No Patient Arrived: Ambulatory Has Dressing in Place as Prescribed: Yes Arrival Time: 08:00 Has Compression in Place as Prescribed: Yes Transfer Assistance: None Pain Present Now: No Patient Identification Verified: Yes Secondary Verification Process Completed: Yes Patient Requires Transmission-Based No Precautions: Patient Has Alerts: Yes Patient Alerts: Patient has reaction to silver dressings. Electronic Signature(s) Signed: 01/31/2022 11:19:49 AM By: [name], [name], RN, [name], [name], [name] Entered By: [name], [name], RN, [name], [name] on 01/30/2022 08:35:27 [name], [name] ([id number]) -------------------------------------------------------------------------------- Compression Therapy Details Patient Name: [name] Date of Service: 01/30/2022 8:00 AM Medical Record Number: [id number] Patient Account Number: [id number] Date of Birth/Sex: [birth year] (41 y.o. M) Treating RN: [name] Primary Care Amanii Snethen: [name] Other Clinician: Referring Channah Godeaux: [name] Treating Anselm Aumiller/Extender: Yaakov Guthrie in Treatment: 269 Compression Therapy Performed for Wound Assessment: Wound #1 Left,Lateral Lower Leg Performed By: Clinician [name], RN Compression Type: Four Layer Pre Treatment ABI: 1.2 Electronic Signature(s) Signed: 01/31/2022 11:19:49 AM By: [name], [name], RN, [name], [name], [name] Entered By: [name], [name], RN, [name], [name] on 01/30/2022 08:36:39 [name]  ([id number]) -------------------------------------------------------------------------------- Encounter Discharge Information Details Patient Name: [name] Date of Service: 01/30/2022 8:00 AM Medical Record Number: [id number] Patient Account Number: [id number] Date of Birth/Sex: Feb 28, 1981 (41 y.o. M) Treating RN: [name] Primary Care Jacobus Colvin: [name] Other Clinician: Referring Dotti Busey: [name] Treating Nayleen Janosik/Extender: Yaakov Guthrie in Treatment: 269 Encounter Discharge Information Items Discharge Condition: Stable Ambulatory Status: Ambulatory Discharge Destination: Home Transportation: Private Auto Accompanied By: self Schedule Follow-up Appointment: Yes Clinical Summary of Care: Electronic Signature(s) Signed: 01/31/2022 11:19:49 AM By: [name], [name], RN, [name], [name], [name] Entered By: [name], [name], RN, [name], [name] on 01/30/2022 08:37:07 [name] ([id number]) -------------------------------------------------------------------------------- Wound Assessment Details Patient Name: [name] Date of Service: 01/30/2022 8:00 AM Medical Record Number: [id number] Patient Account Number: [id number] Date of Birth/Sex: 09-12-1980 (41 y.o. M) Treating RN: [name] Primary Care Natlie Asfour: [name] Other Clinician: Referring Tondra Reierson: [name] Treating Frankey Botting/Extender: Yaakov Guthrie in Treatment: 269 Wound Status Wound Number: 1 Primary Etiology: Pyoderma Wound Location: Left, Lateral Lower Leg Wound Status: Open Wounding Event: Gradually Appeared Comorbid History: Sleep Apnea, Hypertension, Colitis Date Acquired: 11/18/2015 Weeks Of Treatment: 269 Clustered Wound: No Wound Measurements Length: (cm) 1.8 Width: (cm) 1 Depth: (cm) 0.1 Area: (cm) 1.414 Volume: (cm) 0.141 % Reduction in Area: 71.2% % Reduction in Volume: 96.4% Epithelialization: Medium (34-66%) Wound Description Classification: Full Thickness With  Exposed Support Structures Exudate Amount: Medium Exudate Type: Serosanguineous Exudate Color: red, brown Foul Odor After Cleansing: No Slough/Fibrino No Wound Bed Granulation Amount: Large (67-100%) Exposed Structure Granulation Quality: Red Fascia Exposed: No Necrotic Amount: Small (1-33%) Fat Layer (Subcutaneous Tissue) Exposed: Yes Necrotic Quality: Adherent Slough Tendon Exposed: No Muscle Exposed: No Joint Exposed: No Bone Exposed: No Assessment Notes Apligraf and bolster on wound. Unable to assess. Treatment Notes Wound #1 (Lower Leg) Wound Laterality: Left, Lateral Cleanser Wound Cleanser Discharge Instruction: Wash your hands with soap and water. Remove old dressing, discard into plastic bag and place into trash. Cleanse the wound with Wound Cleanser prior to applying  a clean dressing using gauze sponges, not tissues or cotton balls. Do not scrub or use excessive force. Pat dry using gauze sponges, not tissue or cotton balls. Peri-Wound Care Desitin Maximum Strength Ointment 4 (oz) Discharge Instruction: Apply around wound edges to avoid maceration Triamcinolone Acetonide Cream, 0.1%, 15 (g) tube Discharge Instruction: Apply to leg Topical Clobetasol Propionate ointment 0.05%, 60 (g) tube Discharge Instruction: Pt to bring to appt- applied to wound bed Primary Dressing Gibler, [name] ([id number]) Apligraf Secondary Dressing (NON-BORDER) Zetuvit Plus Silicone NON-BORDER 5x5 (in/in) Secured With Compression Wrap Medichoice 4 layer Compression System, 35-40 mmHG Discharge Instruction: Apply multi-layer wrap as directed. Compression Stockings Add-Ons Electronic Signature(s) Signed: 01/31/2022 11:19:49 AM By: [name], [name], RN, [name], [name], [name] Entered By: [name], [name], RN, [name], [name] on 01/30/2022 08:36:18

## 2022-02-01 ENCOUNTER — Encounter: Payer: 59 | Admitting: Physician Assistant

## 2022-02-01 DIAGNOSIS — E11622 Type 2 diabetes mellitus with other skin ulcer: Secondary | ICD-10-CM | POA: Diagnosis not present

## 2022-02-01 NOTE — Progress Notes (Signed)
JENTZEN, MINASYAN ([id number]) Visit Report for 02/01/2022 Chief Complaint Document Details Patient Name: DAISHON, CHUI Date of Service: 02/01/2022 8:15 AM Medical Record Number: [id number] Patient Account Number: [id number] Date of Birth/Sex: May 31, 1980 (41 y.o. M) Treating RN: [name] Primary Care Provider: [name] Other Clinician: Massie Kluver Referring Provider: [name] Treating Provider/Extender: [name] Cliche in Treatment: 269 Information Obtained from: Patient Chief Complaint He is here in follow up evaluation for LLE pyoderma ulcer Electronic Signature(s) Signed: 02/01/2022 8:38:49 AM By: Worthy Keeler PA-C Entered By: Worthy Keeler on 02/01/2022 08:38:49 SALIF, TAY ([id number]) -------------------------------------------------------------------------------- Problem List Details Patient Name: [name] Date of Service: 02/01/2022 8:15 AM Medical Record Number: [id number] Patient Account Number: [id number] Date of Birth/Sex: July 08, 1980 (41 y.o. 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/26/2017 No Yes F17.208 Nicotine dependence, unspecified, with other nicotine-induced disorders 04/24/2020 No Yes Inactive Problems ICD-10 Code Description Active Date Inactive Date L97.213 Non-pressure chronic ulcer of right calf with necrosis of muscle 04/02/2017 04/02/2017 Resolved Problems ICD-10 Code Description Active Date Resolved Date L97.321 Non-pressure chronic ulcer of  left ankle limited to breakdown of skin 10/08/2019 10/08/2019 L03.116 Cellulitis of left lower limb 05/24/2019 05/24/2019 Electronic Signature(s) Signed: 02/01/2022 8:38:45 AM By: Worthy Keeler PA-C Entered By: Worthy Keeler on 02/01/2022 08:38:45

## 2022-02-04 DIAGNOSIS — E11622 Type 2 diabetes mellitus with other skin ulcer: Secondary | ICD-10-CM | POA: Diagnosis not present

## 2022-02-04 NOTE — Progress Notes (Signed)
MOHAMADOU, MACIVER ([id number]) Visit Report for 02/04/2022 Physician Orders Details Patient Name: COSTAS, SENA Date of Service: 02/04/2022 8:00 AM Medical Record Number: [id number] Patient Account Number: [id number] Date of Birth/Sex: [birth year] (41 y.o. M) Treating RN: Carlene Coria Primary Care Provider: [name] Other Clinician: Massie Kluver Referring Provider: [name] Treating Provider/Extender: [name] Cliche in Treatment: [phone] Verbal / Phone Orders: No Diagnosis Coding Follow-up Appointments o Return Appointment in 1 week. o Nurse Visit as needed - twice a week Bathing/ Shower/ Hygiene o Clean wound with Normal Saline or wound cleanser. o May shower with wound dressing protected with water repellent cover or cast protector. o No tub bath. Cellular or Tissue Based Products Wound #1 Left,Lateral Lower Leg o Cellular or Tissue Based Product applied to wound bed; including contact layer, fixation with steri-strips, dry gauze and cover dressing. (DO NOT REMOVE). - no Apligraf applied today Edema Control - Lymphedema / Segmental Compressive Device / Other o Optional: One layer of unna paste to top of compression wrap (to act as an anchor). - He needs this to hold it up o Elevate, Exercise Daily and Avoid Standing for Long Periods of Time. o Elevate legs to the level of the heart and pump ankles as often as possible o Elevate leg(s) parallel to the floor when sitting. Wound Treatment Wound #1 - Lower Leg Wound Laterality: Left, Lateral Cleanser: Wound Cleanser 3 x Per Week/30 Days Discharge Instructions: Wash your hands with soap and water. Remove old dressing, discard into plastic bag and place into trash. Cleanse the wound with Wound Cleanser prior to applying a clean dressing using gauze sponges, not tissues or cotton balls. Do not scrub or use excessive force. Pat dry using gauze sponges, not tissue or cotton balls. Peri-Wound Care: Desitin  Maximum Strength Ointment 4 (oz) 3 x Per Week/30 Days Discharge Instructions: Apply around wound edges to avoid maceration Peri-Wound Care: Triamcinolone Acetonide Cream, 0.1%, 15 (g) tube 3 x Per Week/30 Days Discharge Instructions: Apply to leg Topical: Clobetasol Propionate ointment 0.05%, 60 (g) tube 3 x Per Week/30 Days Discharge Instructions: Pt to bring to appt- applied to wound bed Primary Dressing: Mepitel One Silicone Wound Contact Layer, 2x3 (in/in) 3 x Per Week/30 Days Discharge Instructions: Add to wound bed to prevent sticking. Secondary Dressing: (NON-BORDER) Zetuvit Plus Silicone NON-BORDER 5x5 (in/in) 3 x Per Week/30 Days Compression Wrap: Medichoice 4 layer Compression System, 35-40 mmHG 3 x Per Week/30 Days Discharge Instructions: Apply multi-layer wrap as directed. Electronic Signature(s) Signed: 02/04/2022 9:44:14 AM By: Massie Kluver Signed: 02/04/2022 4:49:05 PM By: Worthy Keeler PA-C Entered By: Massie Kluver on 02/04/2022 08:05:55 Grater, [place] ([id number]) MILBERT, BIXLER ([id number]) -------------------------------------------------------------------------------- SuperBill Details Patient Name: [name] Date of Service: 02/04/2022 Medical Record Number: [id number] Patient Account Number: [id number] Date of Birth/Sex: 08-Feb-1981 (41 y.o. M) Treating RN: Carlene Coria Primary Care Provider: [name] Other Clinician: Massie Kluver Referring Provider: [name] Treating Provider/Extender: [name] Cliche in Treatment: 269 Diagnosis Coding ICD-10 Codes Code Description I87.2 Venous insufficiency (chronic) (peripheral) L97.222 Non-pressure chronic ulcer of left calf with fat layer exposed E11.622 Type 2 diabetes mellitus with other skin ulcer L88 Pyoderma gangrenosum F17.208 Nicotine dependence, unspecified, with other nicotine-induced disorders Facility Procedures CPT4 Code: [id number] Description: (Facility Use Only) [place] Modifier: Quantity: 1 Electronic Signature(s) Signed: 02/04/2022 9:44:14 AM By: Massie Kluver Signed: 02/04/2022 4:49:05 PM By: Worthy Keeler PA-C Entered By: Massie Kluver on 02/04/2022 08:31:21

## 2022-02-04 NOTE — Progress Notes (Signed)
[name], [name] ([id number]) Visit Report for 02/04/2022 Arrival Information Details Patient Name: [name], [name] Date of Service: 02/04/2022 8:00 AM Medical Record Number: [id number] Patient Account Number: [id number] Date of Birth/Sex: 23-Aug-1980 (41 y.o. M) Treating RN: Carlene Coria Primary Care Duston Smolenski: [name] Other Clinician: Massie Kluver Referring Donette Mainwaring: [name] Treating Sonnia Strong/Extender: [name] Cliche in Treatment: 106 Visit Information History Since Last Visit All ordered tests and consults were completed: No Patient Arrived: Ambulatory Added or deleted any medications: No Arrival Time: 08:04 Any new allergies or adverse reactions: No Transfer Assistance: None Had a fall or experienced change in No Patient Requires Transmission-Based No activities of daily living that may affect Precautions: risk of falls: Patient Has Alerts: Yes Hospitalized since last visit: No Patient Alerts: Patient has reaction Pain Present Now: No to silver dressings. Electronic Signature(s) Signed: 02/04/2022 9:44:14 AM By: Massie Kluver Entered By: Massie Kluver on 02/04/2022 08:04:48 [name] ([id number]) -------------------------------------------------------------------------------- Clinic Level of Care Assessment Details Patient Name: [name] Date of Service: 02/04/2022 8:00 AM Medical Record Number: [id number] Patient Account Number: [id number] Date of Birth/Sex: [birth year] (41 y.o. M) Treating RN: Carlene Coria Primary Care Yoseph Haile: [name] Other Clinician: Massie Kluver Referring Niranjan Rufener: [name] Treating Omkar Stratmann/Extender: [name] Cliche in Treatment: [facility] Level of Care Assessment Items TOOL 1 Quantity Score []  - Use when EandM and Procedure is performed on INITIAL visit 0 ASSESSMENTS - Nursing Assessment / Reassessment []  - General Physical Exam (combine w/ comprehensive assessment (listed just below) when performed on  new 0 pt. evals) []  - 0 Comprehensive Assessment (HX, ROS, Risk Assessments, Wounds Hx, etc.) ASSESSMENTS - Wound and Skin Assessment / Reassessment []  - Dermatologic / Skin Assessment (not related to wound area) 0 ASSESSMENTS - Ostomy and/or Continence Assessment and Care []  - Incontinence Assessment and Management 0 []  - 0 Ostomy Care Assessment and Management (repouching, etc.) PROCESS - Coordination of Care []  - Simple Patient / Family Education for ongoing care 0 []  - 0 Complex (extensive) Patient / Family Education for ongoing care []  - 0 Staff obtains Programmer, systems, Records, Test Results / Process Orders []  - 0 Staff telephones HHA, Nursing Homes / Clarify orders / etc []  - 0 Routine Transfer to another Facility (non-emergent condition) []  - 0 Routine Hospital Admission (non-emergent condition) []  - 0 New Admissions / Biomedical engineer / Ordering NPWT, Apligraf, etc. []  - 0 Emergency Hospital Admission (emergent condition) PROCESS - Special Needs []  - Pediatric / Minor Patient Management 0 []  - 0 Isolation Patient Management []  - 0 Hearing / Language / Visual special needs []  - 0 Assessment of Community assistance (transportation, D/C planning, etc.) []  - 0 Additional assistance / Altered mentation []  - 0 Support Surface(s) Assessment (bed, cushion, seat, etc.) INTERVENTIONS - Miscellaneous []  - External ear exam 0 []  - 0 Patient Transfer (multiple staff / Civil Service fast streamer / Similar devices) []  - 0 Simple Staple / Suture removal (25 or less) []  - 0 Complex Staple / Suture removal (26 or more) []  - 0 Hypo/Hyperglycemic Management (do not check if billed separately) []  - 0 Ankle / Brachial Index (ABI) - do not check if billed separately Has the patient been seen at the hospital within the last three years: Yes Total Score: 0 Level Of Care: ____ [name] ([id number]) Electronic Signature(s) Signed: 02/04/2022 9:44:14 AM By: Massie Kluver Entered By:  Massie Kluver on 02/04/2022 08:31:09 [name] ([id number]) -------------------------------------------------------------------------------- Compression Therapy Details Patient Name: [name] Date of Service: 02/04/2022 8:00 AM Medical  Record Number: [id number] Patient Account Number: [id number] Date of Birth/Sex: 12-Jan-1981 (41 y.o. M) Treating RN: Carlene Coria Primary Care Makaila Windle: [name] Other Clinician: Massie Kluver Referring Donny Heffern: [name] Treating Rendy Lazard/Extender: Jeri Cos Weeks in Treatment: 269 Compression Therapy Performed for Wound Assessment: Wound #1 Left,Lateral Lower Leg Performed By: Lenice Pressman, Angie, Compression Type: Four Layer Pre Treatment ABI: 1.2 Electronic Signature(s) Signed: 02/04/2022 9:44:14 AM By: Massie Kluver Entered By: Massie Kluver on 02/04/2022 08:05:37 [name] ([id number]) -------------------------------------------------------------------------------- Encounter Discharge Information Details Patient Name: [name] Date of Service: 02/04/2022 8:00 AM Medical Record Number: [id number] Patient Account Number: [id number] Date of Birth/Sex: December 02, 1980 (41 y.o. M) Treating RN: Carlene Coria Primary Care Fredi Hurtado: [name] Other Clinician: Massie Kluver Referring Ezra Denne: [name] Treating Marquan Vokes/Extender: [name] Cliche in Treatment: 269 Encounter Discharge Information Items Discharge Condition: Stable Ambulatory Status: Ambulatory Discharge Destination: Home Transportation: Private Auto Accompanied By: self Schedule Follow-up Appointment: Yes Clinical Summary of Care: Electronic Signature(s) Signed: 02/04/2022 9:44:14 AM By: Massie Kluver Entered By: Massie Kluver on 02/04/2022 08:07:15 [name] ([id number]) -------------------------------------------------------------------------------- Wound Assessment Details Patient Name: [name] Date of Service:  02/04/2022 8:00 AM Medical Record Number: [id number] Patient Account Number: [id number] Date of Birth/Sex: [birth year] (41 y.o. M) Treating RN: Carlene Coria Primary Care Tiegan Terpstra: [name] Other Clinician: Massie Kluver Referring Donnesha Karg: [name] Treating Belicia Difatta/Extender: [name] Cliche in Treatment: 269 Wound Status Wound Number: 1 Primary Etiology: Pyoderma Wound Location: Left, Lateral Lower Leg Wound Status: Open Wounding Event: Gradually Appeared Comorbid History: Sleep Apnea, Hypertension, Colitis Date Acquired: 11/18/2015 Weeks Of Treatment: 269 Clustered Wound: No Wound Measurements Length: (cm) 0.1 Width: (cm) 0.1 Depth: (cm) 0.1 Area: (cm) 0.008 Volume: (cm) 0.001 % Reduction in Area: 99.8% % Reduction in Volume: 100% Epithelialization: Medium (34-66%) Wound Description Classification: Full Thickness With Exposed Support Structures Exudate Amount: Medium Exudate Type: Serosanguineous Exudate Color: red, brown Foul Odor After Cleansing: No Slough/Fibrino No Wound Bed Granulation Amount: Large (67-100%) Exposed Structure Granulation Quality: Red Fascia Exposed: No Necrotic Amount: Small (1-33%) Fat Layer (Subcutaneous Tissue) Exposed: Yes Necrotic Quality: Adherent Slough Tendon Exposed: No Muscle Exposed: No Joint Exposed: No Bone Exposed: No Treatment Notes Wound #1 (Lower Leg) Wound Laterality: Left, Lateral Cleanser Wound Cleanser Discharge Instruction: Wash your hands with soap and water. Remove old dressing, discard into plastic bag and place into trash. Cleanse the wound with Wound Cleanser prior to applying a clean dressing using gauze sponges, not tissues or cotton balls. Do not scrub or use excessive force. Pat dry using gauze sponges, not tissue or cotton balls. Peri-Wound Care Desitin Maximum Strength Ointment 4 (oz) Discharge Instruction: Apply around wound edges to avoid maceration Triamcinolone Acetonide Cream, 0.1%, 15  (g) tube Discharge Instruction: Apply to leg Topical Clobetasol Propionate ointment 0.05%, 60 (g) tube Discharge Instruction: Pt to bring to appt- applied to wound bed Primary Dressing Mepitel One Silicone Wound Contact Layer, 2x3 (in/in) Discharge Instruction: Add to wound bed to prevent sticking. BILLYE, [place]DAM ([id number]) Secondary Dressing (NON-BORDER) Zetuvit Plus Silicone NON-BORDER 5x5 (in/in) Secured With Compression Wrap Medichoice 4 layer Compression System, 35-40 mmHG Discharge Instruction: Apply multi-layer wrap as directed. Compression Stockings Add-Ons Electronic Signature(s) Signed: 02/04/2022 9:44:14 AM By: Massie Kluver Signed: 02/04/2022 2:31:54 PM By: Carlene Coria RN Entered By: Massie Kluver on 02/04/2022 08:05:07

## 2022-02-04 NOTE — Progress Notes (Signed)
[name] ([id number]) Visit Report for 02/01/2022 Arrival Information Details Patient Name: [name], [name] Date of Service: 02/01/2022 8:15 AM Medical Record Number: [id number] Patient Account Number: [id number] Date of Birth/Sex: 07/23/1980 (41 y.o. M) Treating RN: [name] Primary Care Kingston Shawgo: [name] Other Clinician: Massie Kluver Referring Ryker Pherigo: [name] Treating Winthrop Shannahan/Extender: [name] Cliche in Treatment: 38 Visit Information History Since Last Visit All ordered tests and consults were completed: No Patient Arrived: Ambulatory Added or deleted any medications: No Arrival Time: 08:17 Any new allergies or adverse reactions: No Transfer Assistance: None Had a fall or experienced change in No Patient Requires Transmission-Based No activities of daily living that may affect Precautions: risk of falls: Patient Has Alerts: Yes Hospitalized since last visit: No Patient Alerts: Patient has reaction Pain Present Now: No to silver dressings. Electronic Signature(s) Signed: 02/04/2022 9:44:14 AM By: Massie Kluver Entered By: Massie Kluver on 02/01/2022 08:23:03 [name] ([id number]) -------------------------------------------------------------------------------- Clinic Level of Care Assessment Details Patient Name: [name] Date of Service: 02/01/2022 8:15 AM Medical Record Number: [id number] Patient Account Number: [id number] Date of Birth/Sex: 1980/10/12 (41 y.o. M) Treating RN: [name] Primary Care Tylyn Derwin: [name] Other Clinician: Massie Kluver Referring Jamieka Royle: [name] Treating Blayklee Mable/Extender: [name] Cliche in Treatment: 269 Clinic Level of Care Assessment Items TOOL 1 Quantity Score []  - Use when EandM and Procedure is performed on INITIAL visit 0 ASSESSMENTS - Nursing Assessment / Reassessment []  - General Physical Exam (combine w/ comprehensive assessment (listed just below) when performed on  new 0 pt. evals) []  - 0 Comprehensive Assessment (HX, ROS, Risk Assessments, Wounds Hx, etc.) ASSESSMENTS - Wound and Skin Assessment / Reassessment []  - Dermatologic / Skin Assessment (not related to wound area) 0 ASSESSMENTS - Ostomy and/or Continence Assessment and Care []  - Incontinence Assessment and Management 0 []  - 0 Ostomy Care Assessment and Management (repouching, etc.) PROCESS - Coordination of Care []  - Simple Patient / Family Education for ongoing care 0 []  - 0 Complex (extensive) Patient / Family Education for ongoing care []  - 0 Staff obtains Programmer, systems, Records, Test Results / Process Orders []  - 0 Staff telephones HHA, Nursing Homes / Clarify orders / etc []  - 0 Routine Transfer to another Facility (non-emergent condition) []  - 0 Routine Hospital Admission (non-emergent condition) []  - 0 New Admissions / Biomedical engineer / Ordering NPWT, Apligraf, etc. []  - 0 Emergency Hospital Admission (emergent condition) PROCESS - Special Needs []  - Pediatric / Minor Patient Management 0 []  - 0 Isolation Patient Management []  - 0 Hearing / Language / Visual special needs []  - 0 Assessment of Community assistance (transportation, D/C planning, etc.) []  - 0 Additional assistance / Altered mentation []  - 0 Support Surface(s) Assessment (bed, cushion, seat, etc.) INTERVENTIONS - Miscellaneous []  - External ear exam 0 []  - 0 Patient Transfer (multiple staff / Civil Service fast streamer / Similar devices) []  - 0 Simple Staple / Suture removal (25 or less) []  - 0 Complex Staple / Suture removal (26 or more) []  - 0 Hypo/Hyperglycemic Management (do not check if billed separately) []  - 0 Ankle / Brachial Index (ABI) - do not check if billed separately Has the patient been seen at the hospital within the last three years: Yes Total Score: 0 Level Of Care: ____ [name] ([id number]) Electronic Signature(s) Signed: 02/04/2022 9:44:14 AM By: Massie Kluver Entered By:  Massie Kluver on 02/01/2022 08:50:00 [name] ([id number]) -------------------------------------------------------------------------------- Compression Therapy Details Patient Name: [name] Date of Service: 02/01/2022 8:15 AM Medical  Record Number: [id number] Patient Account Number: [id number] Date of Birth/Sex: 09/15/1980 (41 y.o. M) Treating RN: [name] Primary Care Sadrac Zeoli: [name] Other Clinician: Massie Kluver Referring Jessicia Napolitano: [name] Treating Charise Leinbach/Extender: Jeri Cos Weeks in Treatment: 269 Compression Therapy Performed for Wound Assessment: Wound #1 Left,Lateral Lower Leg Performed By: Lenice Pressman, Angie, Compression Type: Four Layer Pre Treatment ABI: 1.2 Post Procedure Diagnosis Same as Pre-procedure Electronic Signature(s) Signed: 02/04/2022 9:44:14 AM By: Massie Kluver Entered By: Massie Kluver on 02/01/2022 08:48:46 Regan, [name] ([id number]) -------------------------------------------------------------------------------- Encounter Discharge Information Details Patient Name: [name] Date of Service: 02/01/2022 8:15 AM Medical Record Number: [id number] Patient Account Number: [id number] Date of Birth/Sex: 1980/11/21 (41 y.o. M) Treating RN: [name] Primary Care Aqsa Sensabaugh: [name] Other Clinician: Massie Kluver Referring Aharon Carriere: [name] Treating Princella Jaskiewicz/Extender: [name] Cliche in Treatment: 269 Encounter Discharge Information Items Discharge Condition: Stable Ambulatory Status: Ambulatory Discharge Destination: Home Transportation: Private Auto Accompanied By: self Schedule Follow-up Appointment: Yes Clinical Summary of Care: Electronic Signature(s) Signed: 02/04/2022 9:44:14 AM By: Massie Kluver Entered By: Massie Kluver on 02/01/2022 09:05:58 [name] ([id number]) -------------------------------------------------------------------------------- Lower Extremity Assessment  Details Patient Name: [name] Date of Service: 02/01/2022 8:15 AM Medical Record Number: [id number] Patient Account Number: [id number] Date of Birth/Sex: 1980/06/11 (41 y.o. M) Treating RN: [name] Primary Care Shantana Christon: [name] Other Clinician: Massie Kluver Referring Latoiya Maradiaga: [name] Treating Andrell Tallman/Extender: [name] Cliche in Treatment: 269 Edema Assessment Assessed: [Left: Yes] [Right: No] Edema: [Left: Ye] [Right: s] Calf Left: Right: Point of Measurement: 35 cm From Medial Instep 46.2 cm Ankle Left: Right: Point of Measurement: 10 cm From Medial Instep 28 cm Vascular Assessment Pulses: Dorsalis Pedis Palpable: [Left:Yes] Posterior Tibial Palpable: [Left:Yes] Electronic Signature(s) Signed: 02/01/2022 11:29:22 AM By: [name], [name], RN, [name], [name], [name] Signed: 02/04/2022 9:44:14 AM By: Massie Kluver Entered By: Massie Kluver on 02/01/2022 08:36:29 [name] ([id number]) -------------------------------------------------------------------------------- Multi Wound Chart Details Patient Name: [name] Date of Service: 02/01/2022 8:15 AM Medical Record Number: [id number] Patient Account Number: [id number] Date of Birth/Sex: [birth year] (41 y.o. M) Treating RN: [name] Primary Care Raneshia Derick: [name] Other Clinician: Massie Kluver Referring Mirca [name]: [name] Treating Sevanna Ballengee/Extender: [name] Cliche in Treatment: 269 Vital Signs Height(in): 71 Pulse(bpm): 84 Weight(lbs): 338 Blood Pressure(mmHg): 123/81 Body Mass Index(BMI): 47.1 Temperature(F): 98.5 Respiratory Rate(breaths/min): 16 Photos: [N/A:N/A] Wound Location: Left, Lateral Lower Leg N/A N/A Wounding Event: Gradually Appeared N/A N/A Primary Etiology: Pyoderma N/A N/A Comorbid History: Sleep Apnea, Hypertension, Colitis N/A N/A Date Acquired: 11/18/2015 N/A N/A Weeks of Treatment: 269 N/A N/A Wound Status: Open N/A N/A Wound Recurrence: No N/A  N/A Measurements L x W x D (cm) 0.1x0.1x0.1 N/A N/A Area (cm) : 0.008 N/A N/A Volume (cm) : 0.001 N/A N/A % Reduction in Area: 99.80% N/A N/A % Reduction in Volume: 100.00% N/A N/A Classification: Full Thickness With Exposed N/A N/A Support Structures Exudate Amount: Medium N/A N/A Exudate Type: Serosanguineous N/A N/A Exudate Color: red, brown N/A N/A Granulation Amount: Large (67-100%) N/A N/A Granulation Quality: Red N/A N/A Necrotic Amount: Small (1-33%) N/A N/A Exposed Structures: Fat Layer (Subcutaneous Tissue): N/A N/A Yes Fascia: No Tendon: No Muscle: No Joint: No Bone: No Epithelialization: Medium (34-66%) N/A N/A Treatment Notes Electronic Signature(s) Signed: 02/04/2022 9:44:14 AM By: Massie Kluver Entered By: Massie Kluver on 02/01/2022 08:36:54 [name] ([id number]) -------------------------------------------------------------------------------- Ewa Gentry Details Patient Name: [name] Date of Service: 02/01/2022 8:15 AM Medical Record Number: [id number] Patient Account Number: [id number] Date of Birth/Sex:  Apr 27, 1981 (41 y.o. M) Treating RN: [name] Primary Care Adi Seales: [name] Other Clinician: Massie Kluver Referring Jenaveve Fenstermaker: [name] Treating Krissi Willaims/Extender: [name] Cliche in Treatment: Anderson reviewed with physician Active Inactive Electronic Signature(s) Signed: 02/01/2022 11:29:22 AM By: [name] [name], RN, [name], [name], [name] Signed: 02/04/2022 9:44:14 AM By: Massie Kluver Entered By: Massie Kluver on 02/01/2022 08:36:46 Brucks, [name] ([id number]) -------------------------------------------------------------------------------- Pain Assessment Details Patient Name: [name] Date of Service: 02/01/2022 8:15 AM Medical Record Number: [id number] Patient Account Number: [id number] Date of Birth/Sex: 1980-06-16 (41 y.o. M) Treating RN: [name] Primary Care Alvar Malinoski:  [name] Other Clinician: Massie Kluver Referring Oria Klimas: [name] Treating Lynden Flemmer/Extender: [name] Cliche in Treatment: 269 Active Problems Location of Pain Severity and Description of Pain Patient Has Paino No Site Locations Pain Management and Medication Current Pain Management: Electronic Signature(s) Signed: 02/01/2022 11:29:22 AM By: [name], [name], RN, [name], [name], [name] Signed: 02/04/2022 9:44:14 AM By: Massie Kluver Entered By: Massie Kluver on 02/01/2022 08:25:28 [name] ([id number]) -------------------------------------------------------------------------------- Patient/Caregiver Education Details Patient Name: [name] Date of Service: 02/01/2022 8:15 AM Medical Record Number: [id number] Patient Account Number: [id number] Date of Birth/Gender: 12-Aug-1980 (41 y.o. M) Treating RN: [name] Primary Care Physician: [name] Other Clinician: Massie Kluver Referring Physician: [name] Treating Physician/Extender: [name] Cliche in Treatment: 62 Education Assessment Education Provided To: Patient Education Topics Provided Wound/Skin Impairment: Handouts: Other: continue wound care as directed Methods: Explain/Verbal Responses: State content correctly Electronic Signature(s) Signed: 02/04/2022 9:44:14 AM By: Massie Kluver Entered By: Massie Kluver on 02/01/2022 08:50:32 Bacigalupo, [name] ([id number]) -------------------------------------------------------------------------------- Wound Assessment Details Patient Name: [name] Date of Service: 02/01/2022 8:15 AM Medical Record Number: [id number] Patient Account Number: [id number] Date of Birth/Sex: 01-10-1981 (41 y.o. M) Treating RN: [name] Primary Care Keondra Haydu: [name] Other Clinician: Massie Kluver Referring Oberia Beaudoin: [name] Treating Katelynne Revak/Extender: [name] Cliche in Treatment: 269 Wound Status Wound Number: 1 Primary Etiology:  Pyoderma Wound Location: Left, Lateral Lower Leg Wound Status: Open Wounding Event: Gradually Appeared Comorbid History: Sleep Apnea, Hypertension, Colitis Date Acquired: 11/18/2015 Weeks Of Treatment: 269 Clustered Wound: No Photos Wound Measurements Length: (cm) 0.1 Width: (cm) 0.1 Depth: (cm) 0.1 Area: (cm) 0.008 Volume: (cm) 0.001 % Reduction in Area: 99.8% % Reduction in Volume: 100% Epithelialization: Medium (34-66%) Wound Description Classification: Full Thickness With Exposed Support Structures Exudate Amount: Medium Exudate Type: Serosanguineous Exudate Color: red, brown Foul Odor After Cleansing: No Slough/Fibrino No Wound Bed Granulation Amount: Large (67-100%) Exposed Structure Granulation Quality: Red Fascia Exposed: No Necrotic Amount: Small (1-33%) Fat Layer (Subcutaneous Tissue) Exposed: Yes Necrotic Quality: Adherent Slough Tendon Exposed: No Muscle Exposed: No Joint Exposed: No Bone Exposed: No Electronic Signature(s) Signed: 02/01/2022 11:29:22 AM By: [name], [name], RN, [name], [name], [name] Signed: 02/04/2022 9:44:14 AM By: Massie Kluver Entered By: Massie Kluver on 02/01/2022 08:35:17 [name] ([id number]) -------------------------------------------------------------------------------- Vitals Details Patient Name: [name] Date of Service: 02/01/2022 8:15 AM Medical Record Number: [id number] Patient Account Number: [id number] Date of Birth/Sex: 09/06/1980 (41 y.o. M) Treating RN: [name] Primary Care Margareta Laureano: [name] Other Clinician: Massie Kluver Referring Rashika Bettes: [name] Treating [name] [name]/Extender: [name] Cliche in Treatment: 269 Vital Signs Time Taken: 08:23 Temperature (F): 98.5 Height (in): 71 Pulse (bpm): 84 Weight (lbs): 338 Respiratory Rate (breaths/min): 16 Body Mass Index (BMI): 47.1 Blood Pressure (mmHg): 123/81 Reference Range: 80 - 120 mg / dl Electronic Signature(s) Signed: 02/04/2022 9:44:14  AM By: Massie Kluver Entered By: Massie Kluver  on 02/01/2022 08:25:24

## 2022-02-06 ENCOUNTER — Ambulatory Visit: Payer: 59

## 2022-02-08 ENCOUNTER — Encounter: Payer: 59 | Admitting: Physician Assistant

## 2022-02-08 DIAGNOSIS — E11622 Type 2 diabetes mellitus with other skin ulcer: Secondary | ICD-10-CM | POA: Diagnosis not present

## 2022-02-08 NOTE — Progress Notes (Addendum)
CALLAHAN, WILD ([id number]) Visit Report for 02/08/2022 Arrival Information Details Patient Name: [name], [name] Date of Service: 02/08/2022 8:15 AM Medical Record Number: [id number] Patient Account Number: [id number] Date of Birth/Sex: 04-23-1981 (41 y.o. M) Treating RN: [name] Primary Care Jesson Foskey: [name] Other Clinician: Massie Kluver Referring Sargon Scouten: [name] Treating Chailyn Racette/Extender: [name] Cliche in Treatment: 2 Visit Information History Since Last Visit All ordered tests and consults were completed: No Patient Arrived: Ambulatory Added or deleted any medications: No Arrival Time: 08:18 Any new allergies or adverse reactions: No Transfer Assistance: None Had a fall or experienced change in No Patient Requires Transmission-Based No activities of daily living that may affect Precautions: risk of falls: Patient Has Alerts: Yes Hospitalized since last visit: No Patient Alerts: Patient has reaction Pain Present Now: No to silver dressings. Electronic Signature(s) Signed: 02/08/2022 1:10:36 PM By: Massie Kluver Entered By: Massie Kluver on 02/08/2022 08:25:45 [name] ([id number]) -------------------------------------------------------------------------------- Clinic Level of Care Assessment Details Patient Name: [name] Date of Service: 02/08/2022 8:15 AM Medical Record Number: [id number] Patient Account Number: [id number] Date of Birth/Sex: August 09, 1980 (41 y.o. M) Treating RN: [name] Primary Care Xylan Sheils: [name] Other Clinician: Massie Kluver Referring Lenon Kuennen: [name] Treating Khadar Monger/Extender: [name] Cliche in Treatment: 270 Clinic Level of Care Assessment Items TOOL 1 Quantity Score []  - Use when EandM and Procedure is performed on INITIAL visit 0 ASSESSMENTS - Nursing Assessment / Reassessment []  - General Physical Exam (combine w/ comprehensive assessment (listed just below) when performed on  new 0 pt. evals) []  - 0 Comprehensive Assessment (HX, ROS, Risk Assessments, Wounds Hx, etc.) ASSESSMENTS - Wound and Skin Assessment / Reassessment []  - Dermatologic / Skin Assessment (not related to wound area) 0 ASSESSMENTS - Ostomy and/or Continence Assessment and Care []  - Incontinence Assessment and Management 0 []  - 0 Ostomy Care Assessment and Management (repouching, etc.) PROCESS - Coordination of Care []  - Simple Patient / Family Education for ongoing care 0 []  - 0 Complex (extensive) Patient / Family Education for ongoing care []  - 0 Staff obtains Programmer, systems, Records, Test Results / Process Orders []  - 0 Staff telephones HHA, Nursing Homes / Clarify orders / etc []  - 0 Routine Transfer to another Facility (non-emergent condition) []  - 0 Routine Hospital Admission (non-emergent condition) []  - 0 New Admissions / Biomedical engineer / Ordering NPWT, Apligraf, etc. []  - 0 Emergency Hospital Admission (emergent condition) PROCESS - Special Needs []  - Pediatric / Minor Patient Management 0 []  - 0 Isolation Patient Management []  - 0 Hearing / Language / Visual special needs []  - 0 Assessment of Community assistance (transportation, D/C planning, etc.) []  - 0 Additional assistance / Altered mentation []  - 0 Support Surface(s) Assessment (bed, cushion, seat, etc.) INTERVENTIONS - Miscellaneous []  - External ear exam 0 []  - 0 Patient Transfer (multiple staff / Civil Service fast streamer / Similar devices) []  - 0 Simple Staple / Suture removal (25 or less) []  - 0 Complex Staple / Suture removal (26 or more) []  - 0 Hypo/Hyperglycemic Management (do not check if billed separately) []  - 0 Ankle / Brachial Index (ABI) - do not check if billed separately Has the patient been seen at the hospital within the last three years: Yes Total Score: 0 Level Of Care: ____ [name] ([id number]) Electronic Signature(s) Signed: 02/08/2022 1:10:36 PM By: Massie Kluver Entered By:  Massie Kluver on 02/08/2022 08:48:15 Delmore, [name] ([id number]) -------------------------------------------------------------------------------- Compression Therapy Details Patient Name: [name] Date of Service: 02/08/2022 8:15 AM Medical  Record Number: [id number] Patient Account Number: [id number] Date of Birth/Sex: 11-29-1980 (41 y.o. M) Treating RN: [name] Primary Care Avonell Lenig: [name] Other Clinician: Massie Kluver Referring Hektor Huston: [name] Treating Letisia Schwalb/Extender: Jeri Cos Weeks in Treatment: 270 Compression Therapy Performed for Wound Assessment: NonWound Condition Lymphedema - Left Leg Performed By: Lenice Pressman, Angie, Compression Type: Four Layer Pre Treatment ABI: 1.2 Post Procedure Diagnosis Same as Pre-procedure Electronic Signature(s) Signed: 02/08/2022 1:10:36 PM By: Massie Kluver Entered By: Massie Kluver on 02/08/2022 08:46:49 Rossmann, [name] ([id number]) -------------------------------------------------------------------------------- Encounter Discharge Information Details Patient Name: [name] Date of Service: 02/08/2022 8:15 AM Medical Record Number: [id number] Patient Account Number: [id number] Date of Birth/Sex: 11/17/1980 (41 y.o. M) Treating RN: [name] Primary Care Sharmin Foulk: [name] Other Clinician: Massie Kluver Referring Cabe Lashley: [name] Treating Sanya Kobrin/Extender: [name] Cliche in Treatment: 270 Encounter Discharge Information Items Discharge Condition: Stable Ambulatory Status: Ambulatory Discharge Destination: Home Transportation: Private Auto Accompanied By: self Schedule Follow-up Appointment: Yes Clinical Summary of Care: Electronic Signature(s) Signed: 02/08/2022 1:10:36 PM By: Massie Kluver Entered By: Massie Kluver on 02/08/2022 09:07:58 [name] ([id number]) -------------------------------------------------------------------------------- Lower Extremity  Assessment Details Patient Name: [name] Date of Service: 02/08/2022 8:15 AM Medical Record Number: [id number] Patient Account Number: [id number] Date of Birth/Sex: 1980/08/29 (41 y.o. M) Treating RN: [name] Primary Care Cleophas Yoak: [name] Other Clinician: Massie Kluver Referring Jordynn Perrier: [name] Treating [name] [name]/Extender: Jeri Cos Weeks in Treatment: 270 Edema Assessment Assessed: [Left: Yes] [Right: No] Edema: [Left: N] [Right: o] Calf Left: Right: Point of Measurement: 35 cm From Medial Instep 46 cm Ankle Left: Right: Point of Measurement: 10 cm From Medial Instep 28 cm Vascular Assessment Pulses: Dorsalis Pedis Palpable: [Left:Yes] Electronic Signature(s) Signed: 02/08/2022 1:10:36 PM By: Massie Kluver Signed: 02/11/2022 8:05:30 AM By: [name], [name], RN, [name], [name], [name] Entered By: Massie Kluver on 02/08/2022 08:37:52 Hakim, Schyler ([id number]) -------------------------------------------------------------------------------- Multi Wound Chart Details Patient Name: [name] Date of Service: 02/08/2022 8:15 AM Medical Record Number: [id number] Patient Account Number: [id number] Date of Birth/Sex: 1981-04-02 (41 y.o. M) Treating RN: [name] Primary Care Anjela Cassara: [name] Other Clinician: Massie Kluver Referring Enoch Moffa: [name] Treating Keren Alverio/Extender: [name] Cliche in Treatment: 270 Vital Signs Height(in): 71 Pulse(bpm): 76 Weight(lbs): 338 Blood Pressure(mmHg): 123/84 Body Mass Index(BMI): 47.1 Temperature(F): 98.3 Respiratory Rate(breaths/min): 16 Photos: [N/A:N/A] Wound Location: Left, Lateral Lower Leg N/A N/A Wounding Event: Gradually Appeared N/A N/A Primary Etiology: Pyoderma N/A N/A Comorbid History: Sleep Apnea, Hypertension, Colitis N/A N/A Date Acquired: 11/18/2015 N/A N/A Weeks of Treatment: 270 N/A N/A Wound Status: Open N/A N/A Wound Recurrence: No N/A N/A Measurements L x W x D (cm)  0.1x0.1x0.1 N/A N/A Area (cm) : 0.008 N/A N/A Volume (cm) : 0.001 N/A N/A % Reduction in Area: 99.80% N/A N/A % Reduction in Volume: 100.00% N/A N/A Classification: Full Thickness With Exposed N/A N/A Support Structures Exudate Amount: Medium N/A N/A Exudate Type: Serosanguineous N/A N/A Exudate Color: red, brown N/A N/A Granulation Amount: Large (67-100%) N/A N/A Granulation Quality: Red N/A N/A Necrotic Amount: Small (1-33%) N/A N/A Exposed Structures: Fat Layer (Subcutaneous Tissue): N/A N/A Yes Fascia: No Tendon: No Muscle: No Joint: No Bone: No Epithelialization: Medium (34-66%) N/A N/A Treatment Notes Electronic Signature(s) Signed: 02/08/2022 1:10:36 PM By: Massie Kluver Entered By: Massie Kluver on 02/08/2022 08:38:17 [name] ([id number]) -------------------------------------------------------------------------------- [place] Details Patient Name: [name] Date of Service: 02/08/2022 8:15 AM Medical Record Number: [id number] Patient Account Number: [id number] Date of Birth/Sex: 24-Dec-1980 (41 y.o.  M) Treating RN: [name] Primary Care Sonnet Rizor: [name] Other Clinician: Massie Kluver Referring Terriann Difonzo: [name] Treating Corin Formisano/Extender: [name] Cliche in Treatment: [place] reviewed with physician Active Inactive Electronic Signature(s) Signed: 02/08/2022 1:10:36 PM By: Massie Kluver Signed: 02/11/2022 8:05:30 AM By: [name], [name], RN, [name], [name], [name] Entered By: Massie Kluver on 02/08/2022 08:38:00 SHARIFF, LASKY ([id number]) -------------------------------------------------------------------------------- Pain Assessment Details Patient Name: [name] Date of Service: 02/08/2022 8:15 AM Medical Record Number: [id number] Patient Account Number: [id number] Date of Birth/Sex: 11-14-1980 (41 y.o. M) Treating RN: [name] Primary Care Marlin Jarrard: [name] Other Clinician:  Massie Kluver Referring Haide Klinker: [name] Treating Kasarah Sitts/Extender: [name] Cliche in Treatment: 270 Active Problems Location of Pain Severity and Description of Pain Patient Has Paino No Site Locations Pain Management and Medication Current Pain Management: Electronic Signature(s) Signed: 02/08/2022 1:10:36 PM By: Massie Kluver Signed: 02/11/2022 8:05:30 AM By: [name], [name], RN, [name], [name], [name] Entered By: Massie Kluver on 02/08/2022 08:28:26 STELLA, ENCARNACION ([id number]) -------------------------------------------------------------------------------- Patient/Caregiver Education Details Patient Name: [name] Date of Service: 02/08/2022 8:15 AM Medical Record Number: [id number] Patient Account Number: [id number] Date of Birth/Gender: 1981-04-19 (41 y.o. M) Treating RN: [name] Primary Care Physician: [name] Other Clinician: Massie Kluver Referring Physician: [name] Treating Physician/Extender: [name] Cliche in Treatment: 270 Education Assessment Education Provided To: Patient Education Topics Provided Wound/Skin Impairment: Handouts: Other: continue wound care as directed Methods: Explain/Verbal Responses: State content correctly Electronic Signature(s) Signed: 02/08/2022 1:10:36 PM By: Massie Kluver Entered By: Massie Kluver on 02/08/2022 08:48:45 Sarff, [name] ([id number]) -------------------------------------------------------------------------------- Wound Assessment Details Patient Name: [name] Date of Service: 02/08/2022 8:15 AM Medical Record Number: [id number] Patient Account Number: [id number] Date of Birth/Sex: 1980-12-08 (41 y.o. M) Treating RN: [name] Primary Care Talen Poser: [name] Other Clinician: Massie Kluver Referring Athziry Millican: [name] Treating Elke Holtry/Extender: [name] Cliche in Treatment: 270 Wound Status Wound Number: 1 Primary Etiology: Pyoderma Wound Location: Left,  Lateral Lower Leg Wound Status: Healed - Epithelialized Wounding Event: Gradually Appeared Comorbid History: Sleep Apnea, Hypertension, Colitis Date Acquired: 11/18/2015 Weeks Of Treatment: 270 Clustered Wound: No Photos Wound Measurements Length: (cm) 0 Width: (cm) 0 Depth: (cm) 0 Area: (cm) 0 Volume: (cm) 0 % Reduction in Area: 100% % Reduction in Volume: 100% Epithelialization: Medium (34-66%) Wound Description Classification: Full Thickness With Exposed Support Structures Exudate Amount: Medium Exudate Type: Serosanguineous Exudate Color: red, brown Foul Odor After Cleansing: No Slough/Fibrino No Wound Bed Granulation Amount: Large (67-100%) Exposed Structure Granulation Quality: Red Fascia Exposed: No Necrotic Amount: Small (1-33%) Fat Layer (Subcutaneous Tissue) Exposed: Yes Tendon Exposed: No Muscle Exposed: No Joint Exposed: No Bone Exposed: No Treatment Notes Wound #1 (Lower Leg) Wound Laterality: Left, Lateral Cleanser Peri-Wound Care Topical Primary Dressing JAYVON, MOUNGER ([id number]) Secondary Dressing Secured With Compression Wrap Compression Stockings Add-Ons Electronic Signature(s) Signed: 02/08/2022 1:10:36 PM By: Massie Kluver Signed: 02/11/2022 8:05:30 AM By: [name], [name], RN, [name], [name], [name] Entered By: Massie Kluver on 02/08/2022 08:45:20 [name] ([id number]) -------------------------------------------------------------------------------- Vitals Details Patient Name: [name] Date of Service: 02/08/2022 8:15 AM Medical Record Number: [id number] Patient Account Number: [id number] Date of Birth/Sex: Sep 03, 1980 (41 y.o. M) Treating RN: [name] Primary Care Ichiro Chesnut: [name] Other Clinician: Massie Kluver Referring Delpha Perko: [name] Treating Izaac Reisig/Extender: [name] Cliche in Treatment: 270 Vital Signs Time Taken: 08:26 Temperature (F): 98.3 Height (in): 71 Pulse (bpm): 76 Weight (lbs):  338 Respiratory Rate (breaths/min): 16 Body Mass Index (BMI): 47.1 Blood Pressure (  mmHg): 123/84 Reference Range: 80 - 120 mg / dl Electronic Signature(s) Signed: 02/08/2022 1:10:36 PM By: Massie Kluver Entered By: Massie Kluver on 02/08/2022 08:28:10

## 2022-02-08 NOTE — Progress Notes (Addendum)
PETER, KEYWORTH ([id number]) Visit Report for 02/08/2022 Chief Complaint Document Details Patient Name: [name], RADILLA Date of Service: 02/08/2022 8:15 AM Medical Record Number: [id number] Patient Account Number: [id number] Date of Birth/Sex: [birth year] (41 y.o. 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 M) Treating RN: [name] Primary Care Provider: [name] Other Clinician: Massie Kluver Referring Provider: [name] Treating Provider/Extender: [name] Cliche in Treatment: 270 History of Present Illness HPI Description: 12/04/16; 41 year old man who comes into the clinic today for review of a wound on the posterior left calf. He tells me that is been there for about a year. He is not a diabetic he does smoke half a pack per day. He was seen in the ER on 11/20/16 felt to have cellulitis around the wound and was given clindamycin. An x-ray did not show osteomyelitis. The patient initially tells me that he has a milk allergy that sets off a pruritic itching rash on his lower legs which she scratches incessantly and he thinks that's what may have set up the wound. He has been using various topical antibiotics and ointments without any effect. He works in a [employer] and is on his feet all day. He  does not have a prior history of wounds however he does have the rash on both lower legs the right arm and the ventral aspect of his left arm. These are excoriations and clearly have had scratching however there are of macular looking areas on both legs including a substantial larger area on the right leg. This does not have an underlying open area. There is no blistering. The patient tells me that 2 years ago in [place] in response to the rash on his legs he saw a dermatologist who told him he had a condition which may be pyoderma gangrenosum although I may be putting words into his mouth. He seemed to recognize this. On further questioning he admits to a 5 year history of quiesced. ulcerative colitis. He is not in any treatment for this. He's had no recent travel 12/11/16; the patient arrives today with his wound and roughly the same condition we've been using silver alginate this is a deep punched out wound with some surrounding erythema but no tenderness. Biopsy I did did not show confirmed pyoderma gangrenosum suggested nonspecific inflammation and vasculitis but does not provide an actual description of what was seen by the pathologist. I'm really not able to understand this We have also received information from the patient's dermatologist in [place] notes from April 2016. This was a doctor Agarwal-antal. 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 He was prescribed topical steroid high potency under occlusion which helped but at this point the patient did not have a deep punched out wound. 12/18/16; the patient's wound is larger in terms of surface area however this surface looks better and there is less depth. The surrounding erythema also is better. The patient states that the wrap we put on came off 2 days ago when he has been  using his compression stockings. He we are in the process of getting a dermatology consult. 12/26/16 on evaluation today patient's left lower extremity wound  shows evidence of infection with surrounding erythema noted. He has been tolerating the dressing changes but states that he has noted more discomfort. There is a larger area of erythema surrounding the wound. No fevers, chills, nausea, or vomiting noted at this time. With that being said the wound still does have slough covering the surface. He is not allergic to any medication that he is aware of at this point. In regard to his right lower extremity he had several regions that are erythematous and pruritic he wonders if there's anything we can do to help that. 01/02/17 I reviewed patient's wound culture which was obtained his visit last week. He was placed on doxycycline at that point. Unfortunately that does not appear to be an antibiotic that would likely help with the situation however the pseudomonas noted on culture is sensitive to Cipro. Also unfortunately patient's wound seems to have a large compared to last week's evaluation. Not severely so but there are definitely increased measurements in general. He is continuing to have discomfort as well he writes this to be a seven out of 10. In fact he would prefer me not to perform any debridement today due to the fact that he is having discomfort and considering he has an active infection on the little reluctant to do so anyway. No fevers, chills, nausea, or vomiting noted at this time. 01/08/17; patient seems dermatology on September 5. I suspect dermatology will want the slides from the biopsy I did sent to their pathologist. I'm not sure if there is a way we can expedite that. In any case the culture I did before I left on vacation 3 weeks ago showed Pseudomonas he was given 10 days of Cipro and per her description of her intake nurses is actually somewhat better this week although the wound is quite a bit bigger than I remember the last time I saw this. He still has 3 more days of Cipro 01/21/17; dermatology appointment tomorrow. He has completed  the ciprofloxacin for Pseudomonas. Surface of the wound looks better however he is had some deterioration in the lesions on his right leg. Meantime the left lateral leg wound we will continue with sample 01/29/17; patient had his dermatology appointment but I can't yet see that note. He is completed his antibiotics. The wound is more superficial but considerably larger in circumferential area than when he came in. This is in his left lateral calf. He also has swollen erythematous areas with superficial wounds on the right leg and small papular areas on both arms. There apparently areas in her his upper thighs and buttocks I did not look at those. Dermatology biopsied the right leg. Hopefully will have their input next week. 02/05/17; patient went back to see his dermatologist who told him that he had a "scratching problem" as well as staph. He is now on a 30 day course of doxycycline and I believe she gave him triamcinolone cream to the right leg areas to help with the itching [not exactly sure but probably triamcinolone]. She apparently looked at the left lateral leg wound although this was not rebiopsied and I think felt to be ultimately part of the same pathogenesis. He is using sample border foam and changing nevus himself. He now has a new open area on the right posterior leg which was his biopsy site I don't have any  of the dermatology notes 02/12/17; we put the patient in compression last week with SANTYL to the wound on the left leg and the biopsy. Edema is much better and the depth of the wound is now at level of skin. Area is still the same oBiopsy site on the right lateral leg we've also been using santyl with a border foam dressing and he is changing this himself. 02/19/17; Using silver alginate started last week to both the substantial left leg wound and the biopsy site on the right wound. He is tolerating compression well. Has a an appointment with his primary M.D. tomorrow wondering about  diuretics although I'm wondering if the edema problem is actually lymphedema 02/26/17; the patient has been to see his primary doctor Dr. Jerrel Ivory at [place] our primary care. She started him on Lasix 20 mg and this seems to have helped with the edema. However we are not making substantial change with the left lateral calf wound and inflammation. The biopsy site on the right leg also looks stable but not really all that different. 03/12/17; the patient has been to see vein and vascular Dr. Lucky Cowboy. He has had venous reflux studies I have not reviewed these. I did get a call from his dermatology office. They felt that he might have pathergy based on their biopsy on his right leg which led them to look at the slides of YAMEN, CASTROGIOVANNI ([id number]) the biopsy I did on the left leg and they wonder whether this represents pyoderma gangrenosum which was the original supposition in a man with ulcerative colitis albeit inactive for many years. They therefore recommended clobetasol and tetracycline i.e. aggressive treatment for possible pyoderma gangrenosum. 03/26/17; apparently the patient just had reflux studies not an appointment with Dr. dew. She arrives in clinic today having applied clobetasol for 2-3 weeks. He notes over the last 2-3 days excessive drainage having to change the dressing 3-4 times a day and also expanding erythema. 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 Lillia Carmel at [facility] dermatology locally who kindly saw him at my request. 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 My concern was the degree of erythema medially extending from his left leg wound which was either  inflammation from pyoderma or cellulitis. I put him on Augmentin however culture of the wound showed Pseudomonas which is quinolone sensitive. I really don't believe he has cellulitis however in view of everything I will continue and give him a course of Cipro. He is also on doxycycline as an immune modulator for the pyoderma. In addition to his original wound on the left lateral leg with surrounding erythema he has a wound on the right posterior calf which was an original biopsy site done by dermatology. This was felt to represent pathergy from pyoderma gangrenosum 04/16/17; pyoderma gangrenosum. Saw Dr. Lillia Carmel yesterday. He has been using topical antibiotics to both wound areas his original wound on the left and the biopsies/pathergy area on the right. There is definitely some improvement in the inflammation around the wound on the right although the patient states he has increasing sensitivity of the wounds. He is on prednisone 60 and doxycycline 1 as prescribed by Dr. Lillia Carmel. He is covering the  topical antibiotic with gauze and putting this in his own compression stocks and changing this daily. He states that Dr. Lottie Rater did a culture of the left leg wound yesterday 05/07/17; pyoderma gangrenosum. The patient saw Dr. Lillia Carmel yesterday and has a follow-up with her in one month. He is still using topical antibiotics to both wounds although he can't recall exactly what type. He is still on prednisone 60 mg. Dr. Lillia Carmel stated that the doxycycline could stop if we were in agreement. 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 The right medial leg was induced by biopsy/pathergy. The area on the right is essentially healed. Still on high-dose prednisone using topical antibiotics to the wound 07/09/17; pyoderma gangrenosum with wounds on the left lateral leg. The right medial leg has  closed and remains closed. He is still on prednisone 60. oHe tells me he missed his last dermatology appointment with Dr. Lillia Carmel but will make another appointment. He reports that her blood sugar at a recent screen in [place] was high 200's. He was 180 today. 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 I think he is going to require still much longer prednisone perhaps another 3 months before attempting to taper. In the meantime his wound is a lot better. Smaller. He is cleaning this off daily and applying topical antibiotics. When he was last in the clinic I thought about changing to [facility] and actually put in a couple of calls to dermatology although probably not during their business hours. In any case the wound looks better smaller I don't think there is any need to change what he is doing 08/06/17-he is here in follow up evaluation for pyoderma left leg ulcer. He continues on oral prednisone. He has been using triple antibiotic ointment. There is surface debris and we will transition to [facility] and have him return in 2 weeks. He has lost 30 pounds since his last appointment with lifestyle modification. He may benefit from topical steroid cream for treatment this can be considered at a later date. 08/22/17 on evaluation today patient appears to actually be doing rather well in regard to his left lateral lower extremity ulcer. He has actually been managed by Dr. Dellia Nims most recently. Patient is currently on oral steroids at this time. This seems to have been of benefit for him. Nonetheless his last visit was actually with Leah on 08/06/17. Currently he is not utilizing any topical steroid creams although this could be of benefit as well. No fevers, chills, nausea, or vomiting noted at this time. 09/05/17 on evaluation today patient appears to be doing better in regard to his left lateral lower extremity ulcer. He has been tolerating the dressing changes without complication. He is using Santyl  with good effect. Overall I'm very pleased with how things are standing at this point. Patient likewise is happy that this is doing better. 09/19/17 on evaluation today patient actually appears to be doing rather well in regard to his left lateral lower extremity ulcer. Again this is secondary to Pyoderma gangrenosum and he seems to be progressing well with the Santyl which is good news. He's not having any significant pain. 10/03/17 on evaluation today patient appears to be doing excellent in regard to his lower extremity wound on the left secondary to Pyoderma gangrenosum. He has been tolerating the Santyl without complication and in general I feel like he's making good progress. 10/17/17  on evaluation today patient appears to be doing very well in regard to his left lateral lower surety ulcer. He has been tolerating the dressing changes without complication. There does not appear to be any evidence of infection he's alternating the Santyl and the triple antibiotic ointment every other day this seems to be doing well for him. 11/03/17 on evaluation today patient appears to be doing very well in regard to his left lateral lower extremity ulcer. He is been tolerating the dressing changes without complication which is good news. Fortunately there does not appear to be any evidence of infection which is also great news. Overall is doing excellent they are starting to taper down on the prednisone is down to 40 mg at this point it also started topical clobetasol for him. 11/17/17 on evaluation today patient appears to be doing well in regard to his left lateral lower surety ulcer. He's been tolerating the dressing changes without complication. He does note that he is having no pain, no excessive drainage or discharge, and overall he feels like things are going about how he would expect and hope they would. Overall he seems to have no evidence of infection at this time in my opinion which is  good news. 12/04/17-He is seen in follow-up evaluation for right lateral lower extremity ulcer. He has been applying topical steroid cream. Today's measurement show slight increase in size. Over the next 2 weeks we will transition to every other day Santyl and steroid cream. He has been encouraged to monitor for changes and notify clinic with any concerns 12/15/17 on evaluation today patient's left lateral motion the ulcer and fortunately is doing worse again at this point. This just since last week to this week has close to doubled in size according to the patient. I did not seeing last week's I do not have a visual to compare this to in our system was also down so we do not have all the charts and at this point. Nonetheless it does have me somewhat concerned in regard to the fact that again he was worried enough about it he has contact the dermatology that placed them back on the full strength, 50 mg a day of the prednisone that he was taken previous. He continues to alternate using clobetasol along with Santyl at this point. He is obviously somewhat frustrated. 12/22/17 on evaluation today patient appears to be doing a little worse compared to last evaluation. Unfortunately the wound is a little deeper and slightly larger than the last week's evaluation. With that being said he has made some progress in regard to the irritation surrounding at this time unfortunately despite that progress that's been made he still has a significant issue going on here. 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 AMANDO, CHAPUT ([id number]) For that reason I discussed with him today the possibility of perform a wound culture to ensure there's nothing overtly infected. 01/06/18 on evaluation today patient's wound is larger and deeper than previously evaluated. With that being  said it did appear that his wound was infected after my last evaluation with him. Subsequently I did end up prescribing a prescription for Bactrim DS which she has been taking and having no complication with. Fortunately there does not appear to be any evidence of infection at this point in time as far as anything spreading, no want to touch, and  overall I feel like things are showing signs of improvement. 01/13/18 on evaluation today patient appears to be even a little larger and deeper than last time. There still muscle exposed in the base of the wound. Nonetheless he does appear to be less erythematous I do believe inflammation is calming down also believe the infection looks like it's probably resolved at this time based on what I'm seeing. No fevers, chills, nausea, or vomiting noted at this time. 01/30/18 on evaluation today patient actually appears to visually look better for the most part. Unfortunately those visually this looks better he does seem to potentially have what may be an abscess in the muscle that has been noted in the central portion of the wound. This is the first time that I have noted what appears to be fluctuance in the central portion of the muscle. With that being said I'm somewhat more concerned about the fact that this might indicate an abscess formation at this location. I do believe that an ultrasound would be appropriate. This is likely something we need to try to do as soon as possible. He has been switch to mupirocin ointment and he is no longer using the steroid ointment as prescribed by dermatology he sees them again next week he's been decreased from 60 to 40 mg of prednisone. 03/09/18 on evaluation today patient actually appears to be doing a little better compared to last time I saw him. There's not as much erythema surrounding the wound itself. He I did review his most recent infectious disease note which was dated 02/24/18. He saw Dr. Michel Bickers in [place].  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 Patient is now off of antibiotics and they are continually observing this. There seems to be no change in the past two weeks in my pinion based on what the patient says and what I see today compared to what Dr. Megan Salon likely saw two weeks ago. No fevers, chills, nausea, or vomiting noted at this time. 03/23/18 on evaluation today patient's wound actually appears to be showing signs of improvement which is good news. He is currently still on the Dapsone. He is also working on tapering the prednisone to get off of this and Dr. Lottie Rater is working with him in this regard. Nonetheless overall I feel like the wound is doing well it does appear based on the infectious disease note that I reviewed from Dr. 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 I did also review the results of his ultrasound of left lower extremity which revealed there was a dentist tissue in the base of the wound without an abscess noted. 04/06/18 on evaluation today the patient's left lateral lower extremity ulcer actually appears to be doing fairly well which is excellent news. There does not appear to be any evidence of infection at this time which is also great news. Overall he still does have a significantly large ulceration although little by little he seems to be making progress. He is down to 10 mg a day of the prednisone. 04/20/18 on evaluation today patient actually appears to be doing excellent at this time in regard to his left lower extremity ulcer. He's making signs of good progress unfortunately this is taking much longer than we would really like to see  but nonetheless he is making progress. Fortunately there does not appear to be any evidence of infection at this time. No  fevers, chills, nausea, or vomiting noted at this time. The patient has not been using the Santyl due to the cost he hadn't got in this field yet. He's mainly been using the antibiotic ointment topically. Subsequently he also tells me that he really has not been scrubbing in the shower I think this would be helpful again as I told him it doesn't have to be anything too aggressive to even make it believe just enough to keep it free of some of the loose slough and biofilm on the wound surface. 05/11/18 on evaluation today patient's wound appears to be making slow but sure progress in regard to the left lateral lower extremity ulcer. He is been tolerating the dressing changes without complication. Fortunately there does not appear to be any evidence of infection at this time. He is still just using triple antibiotic ointment along with clobetasol occasionally over the area. He never got the Santyl and really does not seem to intend to in my pinion. 06/01/18 on evaluation today patient appears to be doing a little better in regard to his left lateral lower extremity ulcer. He states that overall he does not feel like he is doing as well with the Dapsone as he did with the prednisone. Nonetheless he sees his dermatologist later today and is gonna talk to them about the possibility of going back on the prednisone. Overall again I believe that the wound would be better if you would utilize Santyl but he really does not seem to be interested in going back to the [place] at this point. He has been using triple antibiotic ointment. 06/15/18 on evaluation today patient's wound actually appears to be doing about the same at this point. Fortunately there is no signs of infection at this time. He has made slight improvements although he continues to not really want to clean the wound bed at this point. He states that he just doesn't mess with it he doesn't want to cause any problems with everything else he has going  on. He has been on medication, antibiotics as prescribed by his dermatologist, for a staff infection of his lower extremities which is really drying out now and looking much better he tells me. Fortunately there is no sign of overall infection. 06/29/18 on evaluation today patient appears to be doing well in regard to his left lateral lower surety ulcer all things considering. Fortunately his staff infection seems to be greatly improved compared to previous. He has no signs of infection and this is drying up quite nicely. He is still the doxycycline for this is no longer on cental, Dapsone, or any of the other medications. His dermatologist has recommended possibility of an infusion but right now he does not want to proceed with that. 07/13/18 on evaluation today patient appears to be doing about the same in regard to his left lateral lower surety ulcer. Fortunately there's no signs of infection at this time which is great news. Unfortunately he still builds up a significant amount of Slough/biofilm of the surface of the wound he still is not really cleaning this as he should be appropriately. Again I'm able to easily with saline and gauze remove the majority of this on the surface which if you would do this at home would likely be a dramatic improvement for him as far as getting the area to improve. Nonetheless overall I  still feel like he is making progress is just very slow. I think Santyl will be of benefit for him as well. Still he has not gotten this as of this point. 07/27/18 on evaluation today patient actually appears to be doing little worse in regards of the erythema around the periwound region of the wound he also tells me that he's been having more drainage currently compared to what he was experiencing last time I saw him. He states not quite as bad as what he had because this was infected previously but nonetheless is still appears to be doing poorly. Fortunately there is no evidence  of systemic infection at this point. The patient tells me that he is not going to be able to afford the Santyl. He is still waiting to hear about the infusion therapy with his dermatologist. Apparently she wants an updated colonoscopy first. 08/10/18 on evaluation today patient appears to be doing better in regard to his left lateral lower extremity ulcer. Fortunately he is showing signs of improvement in this regard he's actually been approved for Remicade infusion's as well although this has not been scheduled as of yet. Fortunately there's no signs of active infection at this time in regard to the wound although he is having some issues with infection of the right lower extremity is been seen as dermatologist for this. Fortunately they are definitely still working with him trying to keep things under control. DAMEN, WINDSOR ([id number]) 09/07/18 on evaluation today patient is actually doing rather well in regard to his left lateral lower extremity ulcer. He notes these actually having some hair grow back on his extremity which is something he has not seen in years. He also tells me that the pain is really not giving them any trouble at this time which is also good news overall she is very pleased with the progress he's using a combination of the mupirocin along with the probate is all mixed. 09/21/18 on evaluation today patient actually appears to be doing fairly well all things considered in regard to his looks from the ulcer. He's been tolerating the dressing changes without complication. Fortunately there's no signs of active infection at this time which is good news he is still on all antibiotics or prevention of the staff infection. He has been on prednisone for time although he states it is gonna contact his dermatologist and see if she put them on a short course due to some irritation that he has going on currently. Fortunately there's no evidence of any overall worsening this is going very slow  I think cental would be something that would be helpful for him although he states that $50 for tube is quite expensive. He therefore is not willing to get that at this point. 10/06/18 on evaluation today patient actually appears to be doing decently well in regard to his left lateral leg ulcer. He's been tolerating the dressing changes without complication. Fortunately there's no signs of active infection at this time. Overall I'm actually rather pleased with the progress he's making although it's slow he doesn't show any signs of infection and he does seem to be making some improvement. I do believe that he may need a switch up and dressings to try to help this to heal more appropriately and quickly. 10/19/18 on evaluation today patient actually appears to be doing better in regard to his left lateral lower extremity ulcer. This is shown signs of having much less Slough buildup at this point due to the fact  he has been using the Santyl. Obviously this is very good news. The overall size of the wound is not dramatically smaller but again the appearance is. 11/02/18 on evaluation today patient actually appears to be doing quite well in regard to his lower [place] ulcer. A lot of the skin around the ulcer is actually somewhat irritating at this point this seems to be more due to the dressing causing irritation from the adhesive that anything else. Fortunately there is no signs of active infection at this time. 11/24/18 on evaluation today patient appears to be doing a little worse in regard to his overall appearance of his lower extremity ulcer. There's more erythema and warmth around the wound unfortunately. He is currently on doxycycline which he has been on for some time. With that being said I'm not sure that seems to be helping with what appears to possibly be an acute cellulitis with regard to his left lower extremity ulcer. No fevers, chills, nausea, or vomiting noted at this time. 12/08/18 on  evaluation today patient's wounds actually appears to be doing significantly better compared to his last evaluation. He has been using Santyl along with alternating tripling about appointment as well as the steroid cream seems to be doing quite well and the wound is showing signs of improvement which is excellent news. Fortunately there's no evidence of infection and in fact his culture came back negative with only normal skin flora noted. 12/21/2018 upon evaluation today patient actually appears to be doing excellent with regard to his ulcer. This is actually the best that I have seen it since have been helping to take care of him. It is both smaller as well as less slough noted on the surface of the wound and seems to be showing signs of good improvement with new skin growing from the edges. He has been using just the triamcinolone he does wonder if he can get a refill of that ointment today. 01/04/2019 upon evaluation today patient actually appears to be doing well with regard to his left lateral lower extremity ulcer. With that being said it does not appear to be that he is doing quite as well as last time as far as progression is concerned. There does not appear to be any signs of infection or significant irritation which is good news. With that being said I do believe that he may benefit from switching to a collagen based dressing based on how clean The wound appears. 01/18/2019 on evaluation today patient actually appears to be doing well with regard to his wound on the left lower extremity. He is not made a lot of progress compared to where we were previous but nonetheless does seem to be doing okay at this time which is good news. There is no signs of active infection which is also good news. My only concern currently is I do wish we can get him into utilizing the collagen dressing his insurance would not pay for the supplies that we ordered although it appears that he may be able to order this  through his supply company that he typically utilizes. This is [employer]. Nonetheless he did try to order it during the office visit today and it appears this did go through. We will see if he can get that it is a different brand but nonetheless he has collagen and I do think will be beneficial. 02/01/2019 on evaluation today patient actually appears to be doing a little worse today in regard to the overall size of  his wounds. Fortunately there is no signs of active infection at this time. That is visually. Nonetheless when this is happened before it was due to infection. For that reason were somewhat concerned about that this time as well. 02/08/2019 on evaluation today patient unfortunately appears to be doing slightly worse with regard to his wound upon evaluation today. Is measuring a little deeper and a little larger unfortunately. I am not really sure exactly what is causing this to enlarge he actually did see his dermatologist she is going to see about initiating Humira for him. Subsequently she also did do steroid injections into the wound itself in the periphery. Nonetheless still nonetheless he seems to be getting a little bit larger he is gone back to just using the steroid cream topically which I think is appropriate. I would say hold off on the collagen for the time being is definitely a good thing to do. Based on the culture results which we finally did get the final result back regarding it shows staph as the bacteria noted again that can be a normal skin bacteria based on the fact however he is having increased drainage and worsening of the wound measurement wise I would go ahead and place him on an antibiotic today I do believe for this. 02/15/2019 on evaluation today patient actually appears to be doing somewhat better in regard to his ulcer. There is no signs of worsening at this time I did review his culture results which showed evidence of Staphylococcus aureus but not MRSA. Again  this could just be more related to the normal skin bacteria although he states the drainage has slowed down quite a bit he may have had a mild infection not just colonization. And was much smaller and then since around10/04/2019 on evaluation today patient appears to be doing unfortunately worse as far as the size of the wound. I really feel like that this is steadily getting larger again it had been doing excellent right at the beginning of September we have seen a steady increase in the area of the wound it is almost 2-1/2 times the size it was on September 1. Obviously this is a bad trend this is not wanting to see. For that reason we went back to using just the topical triamcinolone cream which does seem to help with inflammation. I checked him for bacteria by way of culture and nothing showed positive there. I am considering giving him a short course of a tapering steroid Dosepak today to see if that is can be beneficial for him. The patient is in agreement with giving that a try. 03/08/2019 on evaluation today patient appears to be doing very well in comparison to last evaluation with regard to his lower extremity ulcer. This is showing signs of less inflammation and actually measuring slightly smaller compared to last time every other week over the past month and a half he has been measuring larger larger larger. Nonetheless I do believe that the issue has been inflammation the prednisone does seem to [facility], [place] ([id number]) have been beneficial for him which is good news. No fevers, chills, nausea, vomiting, or diarrhea. 03/22/2019 on evaluation today patient appears to be doing about the same with regard to his leg ulcer. He has been tolerating the dressing changes without complication. With that being said the wound seems to be mostly arrested at its current size but really is not making any progress except for when we prescribed the prednisone. He did show some signs of  dropping as far as  the overall size of the wound during that interval week. Nonetheless this is something he is not on long-term at this point and unfortunately I think he is getting need either this or else the Humira which his dermatologist has discussed try to get approval for. With that being said he will be seeing his dermatologist on the 11th of this month that is November. 04/19/2019 on evaluation today patient appears to be doing really about the same the wound is measuring slightly larger compared to last time I saw him. He has not been into the office since November 2 due to the fact that he unfortunately had Covid as that his entire family. He tells me that it was rough but they did pull-through and he seems to be doing much better. Fortunately there is no signs of active infection at this time. No fevers, chills, nausea, vomiting, or diarrhea. 05/10/2019 on evaluation today patient unfortunately appears to be doing significantly worse as compared to last time I saw him. He does tell me that he has had his first dose of Humira and actually is scheduled to get the next one in the upcoming week. With that being said he tells me also that in the past several days he has been having a lot of issues with green drainage she showed me a picture this is more blue-green in color. He is also been having issues with increased sloughy buildup and the wound does appear to be larger today. Obviously this is not the direction that we want everything to take based on the starting of his Humira. Nonetheless I think this is definitely a result of likely infection and to be honest I think this is probably Pseudomonas causing the infection based on what I am seeing. 05/24/2019 on evaluation today patient unfortunately appears to be doing significantly worse compared to his prior evaluation with me 2 weeks ago. I did review his culture results which showed that he does have Staph aureus as well as Pseudomonas noted on the culture.  Nonetheless the Levaquin that I prescribed for him does not appear to have been appropriate and in fact he tells me he is no longer experiencing the green drainage and discharge that he had at the last visit. Fortunately there is no signs of active infection at this time which is good news although the wound has significantly worsened it in fact is much deeper than it was previous. We have been utilizing up to this point triamcinolone ointment as the prescription topical of choice but at this time I really feel like that the wound is getting need to be packed in order to appropriately manage this due to the deeper nature of the wound. Therefore something along the lines of an alginate dressing may be more appropriate. 05/31/2019 upon inspection today patient's wound actually showed signs of doing poorly at this point. Unfortunately he just does not seem to be making any good progress despite what we have tried. He actually did go ahead and pick up the Cipro and start taking that as he was noticing more green drainage he had previously completed the Levaquin that I prescribed for him as well. Nonetheless he missed his appointment for the seventh last week on Wednesday with the wound care center and [facility] where his dermatologist referred him. Obviously I do think a second opinion would be helpful at this point especially in light of the fact that the patient seems to be doing so  poorly despite the fact that we have tried everything that I really know how at this point. The only thing that ever seems to have helped him in the past is when he was on high doses of continual steroids that did seem to make a difference for him. 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 06/07/2019 upon evaluation today patient unfortunately appears to be doing  worse yet again with regard to his wound. In fact I am starting to question whether or not he may have a fluid pocket in the muscle at this point based on the bulging and the soft appearance to the central portion of the muscle area. There is not anything draining from the muscle itself at this time which is good news but nonetheless the wound is expanding. I am not really seeing any results of the Humira as far as overall wound progression based on what I am seeing at this point. The patient has been referred for second opinion with regard to his wound to the [facility] wound care center by his dermatologist which I definitely am not in opposition to. Unfortunately we tried multiple dressings in the past including collagen, alginate, and at one point even Hydrofera Blue. With that being said he is never really used it for any significant amount of time due to the fact that he often complains of pain associated with these dressings and then will go back to either using the Santyl which she has done intermittently or more frequently the triamcinolone. He is also using his own compression stockings. We have wrapped him in the past but again that was something else that he really was not a big fan of. 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 He seemed to do much better when he was on the steroids although honestly I understand that the steroids are not necessarily the best medication to be on long-term obviously 06/14/2019 on evaluation today patient appears to be doing actually a little bit better with regard to the overall appearance with his leg. Unfortunately he does continue to have  issues with what appears to be some fluid underneath the muscle although he did see the wound specialty center at [facility] last week their main goals were to see about infusion therapy in place of the Humira as they feel like that is not quite strong enough. They also recommended that we continue with the treatment otherwise as we are they felt like that was appropriate and they are okay with him continuing to follow-up here with [place] in that regard. With that being said they are also sending him to the vein specialist there to see about vein stripping and if that would be of benefit for him. Subsequently they also did not really address whether or not an ultrasound of the muscle area to see if there is anything that needs to be addressed here would be appropriate or not. For that reason I discussed this with him last  week I think we may proceed down that road at this point. 06/21/2019 upon evaluation today patient's wound actually appears to be doing slightly better compared to previous evaluations. I do believe that he has made a difference with regard to the progression here with the use of oral steroids. Again in the past has been the only thing that is really calm things down. He does tell me that from [facility] is gotten a good news from there that there are no further vein stripping that is necessary at this point. I do not have that available for review today although the patient did relay this to me. He also did obtain and have the ultrasound of the wound completed which I did sign off on today. It does appear that there is no fluid collection under the muscle this is likely then just edematous tissue in general. That is also good news. Overall I still believe the inflammation is the main issue here. He did inquire about the possibility of a wound VAC again with the muscle protruding like it is I am not really sure whether the wound VAC is necessarily ideal or not. That is something we will have to consider  although I do believe he may need compression wrapping to try to help with edema control which could potentially be of benefit. 06/28/2019 on evaluation today patient appears to be doing slightly better measurement wise although this is not terribly smaller he least seems to be trending towards that direction. With that being said he still seems to have purulent drainage noted in the wound bed at this time. He has been on Levaquin followed by Cipro over the past month. Unfortunately he still seems to have some issues with active infection at this time. I did perform a culture last week in order to evaluate and see if indeed there was still anything going on. Subsequently the culture did come back showing Pseudomonas which is consistent with the drainage has been having which is blue-green in color. He also has had an odor that again was somewhat consistent with Pseudomonas as well. Long story short it appears that the culture showed an intermediate finding with regard to how well the Cipro will work for the Pseudomonas infection. Subsequently being that he does not seem to be clearing up and at best what we are doing is just keeping this at [place] I think he may need to see infectious disease to discuss IV antibiotic options. JEMUEL, LAURSEN ([id number]) 07/05/2019 upon evaluation today patient appears to be doing okay in regard to his leg ulcer. He has been tolerating the dressing changes at this point without complication. Fortunately there is no signs of active infection at this time which is good news. No fevers, chills, nausea, vomiting, or diarrhea. With that being said he does have an appointment with infectious disease tomorrow and his primary care on Wednesday. Again the reason for the infectious disease referral was due to the fact that he did not seem to be fully resolving with the use of oral antibiotics and therefore we were thinking that IV antibiotic therapy may be necessary secondary to the  fact that there was an intermediate finding for how effective the Cipro may be. Nonetheless again he has been having a lot of purulent and even green drainage. Fortunately right now that seems to have calmed down over the past week with the reinitiation of the oral antibiotic. Nonetheless we will see what Dr. Megan Salon has to say. 07/12/2019 upon evaluation today  patient appears to be doing about the same at this point in regard to his left lower extremity ulcer. Fortunately there is no signs of active infection at this time which is good news I do believe the Levaquin has been beneficial I did review Dr. Hale Bogus note and to be honest I agree that the patient's leg does appear to be doing better currently. What we found in the past as he does not seem to really completely resolve he will stop the antibiotic and then subsequently things will revert back to having issues with blue-green drainage, increased pain, and overall worsening in general. Obviously that is the reason I sent him back to infectious disease. 07/19/2019 upon evaluation today patient appears to be doing roughly the same in size there is really no dramatic improvement. He has started back on the Levaquin at this point and though he seems to be doing okay he did still have a lot of blue/green drainage noted on evaluation today unfortunately. I think that this is still indicative more likely of a Pseudomonas infection as previously noted and again he does see Dr. Megan Salon in just a couple of days. I do not know that were really able to effectively clear this with just oral antibiotics alone based on what I am seeing currently. Nonetheless we are still continue to try to manage as best we can with regard to the patient and his wound. I do think the wrap was helpful in decreasing the edema which is excellent news. No fevers, chills, nausea, vomiting, or diarrhea. 07/26/2019 upon evaluation today patient appears to be doing slightly better with  regard to the overall appearance of the muscle there is no dark discoloration centrally. Fortunately there is no signs of active infection at this time. No fevers, chills, nausea, vomiting, or diarrhea. Patient's wound bed currently the patient did have an appointment with Dr. Megan Salon at infectious disease last week. With that being said Dr. Megan Salon the patient states was still somewhat hesitant about put him on any IV antibiotics he wanted [place] to repeat cultures today and then see where things go going forward. He does look like Dr. Megan Salon because of some improvement the patient did have with the Levaquin wanted [place] to see about repeating cultures. If it indeed grows the Pseudomonas again then he recommended a possibility of considering a PICC line placement and IV antibiotic therapy. He plans to see the patient back in 1 to 2 weeks. 08/02/2019 upon evaluation today patient appears to be doing poorly with regard to his left lower extremity. We did get the results of his culture back it shows that he is still showing evidence of Pseudomonas which is consistent with the purulent/blue-green drainage that he has currently. Subsequently the culture also shows that he now is showing resistance to the oral fluoroquinolones which is unfortunate as that was really the only thing to treat the infection prior. I do believe that he is looking like this is going require IV antibiotic therapy to get this under control. Fortunately there is no signs of systemic infection at this time which is good news. The patient does see Dr. Megan Salon tomorrow. 08/09/2019 upon evaluation today patient appears to be doing better with regard to his left lower extremity ulcer in regard to the overall appearance. He is currently on IV antibiotic therapy. As ordered by Dr. Megan Salon. Currently the patient is on ceftazidime which she is going to take for the next 2 weeks and then follow-up for 4 to 5-week  appointment with Dr. Megan Salon.  The patient started this this past Friday symptoms have not for a total of 3 days currently in full. 08/16/2019 upon evaluation today patient's wound actually does show muscle in the base of the wound but in general does appear to be much better as far as the overall evidence of infection is concerned. In fact I feel like this is for the most part cleared up he still on the IV antibiotics he has not completed the full course yet but I think he is doing much better which is excellent news. 08/23/2019 upon evaluation today patient appears to be doing about the same with regard to his wound at this point. He tells me that he still has pain unfortunately. Fortunately there is no evidence of systemic infection at this time which is great news. There is significant muscle protrusion. 09/13/19 upon evaluation today patient appears to be doing about the same in regard to his leg unfortunately. He still has a lot of drainage coming from the ulceration there is still muscle exposed. With that being said the patient's last wound culture still showed an intermediate finding with regard to the Pseudomonas he still having the bluish/green drainage as well. Overall I do not know that the wound has completely cleared of infection at this point. Fortunately there is no signs of active infection systemically at this point which is good news. 09/20/2019 upon evaluation today patient's wound actually appears to be doing about the same based on what I am seeing currently. I do not see any signs of systemic infection he still does have evidence of some local infection and drainage. He did see Dr. Megan Salon last week and Dr. Megan Salon states that he probably does need a different IV antibiotic although he does not want to put him on this until the patient begins the Remicade infusion which is actually scheduled for about 10 days out from today on 13 May. Following that time Dr. Megan Salon is good to see him back and then will  evaluate the feasibility of starting him on the IV antibiotic therapy once again at that point. I do not disagree with this plan I do believe as Dr. Megan Salon stated in his note that I reviewed today that the patient's issue is multifactorial with the pyoderma being 1 aspect of this that were hoping the Remicade will be helpful for her. In the meantime I think the gentamicin is, helping to keep things under decent okay control in regard to the ulcer. 09/27/2019 upon evaluation today patient appears to be doing about the same with regard to his wound still there is a lot of muscle exposure though he does have some hyper granulation tissue noted around the edge and actually some granulation tissue starting to form over the muscle which is actually good news. Fortunately there is no evidence of active infection which is also good news. His pain is less at this point. 5/21; this is a patient I have not seen in a long time. 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 He has a large wound on the left lateral leg with protruding muscle. He comes in the clinic today showing the same area on his left medial ankle. He says there is been a spot there for some time although we have not previously defined this. Today he has a clearly defined area with slight amount of skin breakdown surrounded by raised areas with a purplish hue in color. This is not painful he  says it is irritated. 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 He still has muscle protruding from the lateral portion of his left leg but fortunately the new area that they were concerned about at his last visit does not appear to have opened at this point. He is currently on Remicade infusions and seems to be doing better in my opinion in fact the wound itself seems to be overall much better. The purplish  discoloration that he did have seems to have resolved and I think that is a good sign that hopefully the Remicade is doing its job. He does have some biofilm noted over the surface of the wound. 11/01/2019 on evaluation today patient's wound actually appears to be doing excellent at this time. Fortunately there is no evidence of active infection and overall I feel like he is making great progress. The Remicade seems to be due excellent job in my opinion. ELDIN, BONSELL ([id number]) 11/08/19 evaluation today vision actually appears to be doing quite well with regard to his weight ulcer. He's been tolerating dressing changes without complication. Fortunately there is no evidence of infection. No fevers, chills, nausea, or vomiting noted at this time. Overall states that is having more itching than pain which is actually a good sign in my opinion. 12/13/2019 upon evaluation today patient appears to be doing well today with regard to his wound. He has been tolerating the dressing changes without complication. Fortunately there is no sign of active infection at this time. No fevers, chills, nausea, vomiting, or diarrhea. Overall I feel like the infusion therapy has been very beneficial for him. 01/06/2020 on evaluation today patient appears to be doing well with regard to his wound. This is measuring smaller and actually looks to be doing better. Fortunately there is no signs of active infection at this point. No fevers, chills, nausea, vomiting, or diarrhea. With that being said he does still have the blue-green drainage but this does not seem to be causing any significant issues currently. He has been using the gentamicin that does seem to be keeping things under decent control at this point. He goes later this morning for his next infusion therapy for the pyoderma which seems to also be very beneficial. 02/07/2020 on evaluation today patient appears to be doing about the same in regard to his wounds  currently. Fortunately there is no signs of active infection systemically he does still have evidence of local infection still using gentamicin. He also is showing some signs of improvement albeit slowly I do feel like we are making some progress here. 02/21/2020 upon evaluation today patient appears to be making some signs of improvement the wound is measuring a little bit smaller which is great news and overall I am very pleased with where he stands currently. He is going to be having infusion therapy treatment on the 15th of this month. Fortunately there is no signs of active infection at this time. 03/13/2020 I do believe patient's wound is actually showing some signs of improvement here which is great news. He has continue with the infusion therapy through rheumatology/dermatology at [facility]. That does seem to be beneficial. I still think he gets as much benefit from this as he did from the prednisone initially but nonetheless obviously this is less harsh on his body that the prednisone as far as they are concerned. 03/31/2020 on evaluation today patient's wound actually showing signs of some pretty  good improvement in regard to the overall appearance of the wound bed. There is still muscle exposed though he does have some epithelial growth around the edges of the wound. Fortunately there is no signs of active infection at this time. No fevers, chills, nausea, vomiting, or diarrhea. 04/24/2020 upon evaluation today patient appears to be doing about the same in regard to his leg ulcer. He has been tolerating the dressing changes without complication. Fortunately there is no signs of active infection at this time. No fevers, chills, nausea, vomiting, or diarrhea. With that being said he still has a lot of irritation from the bandaging around the edges of the wound. We did discuss today the possibility of a referral to plastic surgery. 05/22/2020 on evaluation today patient appears to be doing well with  regard to his wounds all things considered. He has not been able to get the Chantix apparently there is a recall nurse that I was unaware of put out by [employer] involuntarily. Nonetheless for now I am and I have to do some research into what may be the best option for him to help with quitting in regard to smoking and we discussed that today. 06/26/2020 upon evaluation today patient appears to be doing well with regard to his wound from the standpoint of infection I do not see any signs of infection at this point. With that being said unfortunately he is still continuing to have issues with muscle exposure and again he is not having a whole lot of new skin growth unfortunately. There does not appear to be any signs of active infection at this time. No fevers, chills, nausea, vomiting, or diarrhea. 07/10/2020 upon evaluation today patient appears to be doing a little bit more poorly currently compared to where he was previous. I am concerned currently about an active infection that may be getting worse especially in light of the increased size and tenderness of the wound bed. No fevers, chills, nausea, vomiting, or diarrhea. 07/24/2020 upon evaluation today patient appears to be doing poorly in regard to his leg ulcer. He has been tolerating the dressing changes without complication but unfortunately is having a lot of discomfort. 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 Subsequently I think he is going require possibly IV antibiotics to get this under control. I am very concerned about the severity of his infection and the amount of discomfort he is having. 07/31/2020 upon evaluation today patient appears to be doing about the same in regard to his leg wound. He did see Dr. Megan Salon and Dr. Megan Salon is actually going to start him on IV antibiotics. He goes for the PICC line tomorrow. With that being said there do not have that run for  2 weeks and then see how things are doing and depending on how he is progressing they may extend that a little longer. Nonetheless I am glad this is getting ready to be in place and definitely feel it may help the patient. In the meantime is been using mainly triamcinolone to the wound bed has an anti-inflammatory. 08/07/2020 on evaluation today patient appears to be doing well with regard to his wound compared even last week. In the interim he has gotten the PICC line placed and overall this seems to be doing excellent. There does not appear to be any evidence of infection which is great news systemically although locally of course has had the infection this appears to be improving with the use of  the antibiotics. 08/14/2020 upon evaluation today patient's wound actually showing signs of excellent improvement. Overall the irritation has significantly improved the drainage is back down to more of a normal level and his pain is really pretty much nonexistent compared to what it was. Obviously I think that this is significantly improved secondary to the IV antibiotic therapy which has made all the difference in the world. Again he had a resistant form of Pseudomonas for which oral antibiotics just was not cutting it. Nonetheless I do think that still we need to consider the possibility of a surgical closure for this wound is been open so long and to be honest with muscle exposed I think this can be very hard to get this to close outside of this although definitely were still working to try to do what we can in that regard. 08/21/2020 upon evaluation today patient appears to be doing very well with regard to his wounds on the left lateral lower extremity/calf area. Fortunately there does not appear to be signs of active infection which is great news and overall very pleased with where things stand today. He is actually wrapping up his treatment with IV antibiotics tomorrow. After that we will see where  things go from there. 08/28/2020 upon evaluation today patient appears to be doing decently well with regard to his leg ulcer. There does not appear to be any signs of active infection which is great news and overall very pleased with where things stand today. No fevers, chills, nausea, vomiting, or diarrhea. 09/18/2020 upon evaluation today patient appears to be doing well with regard to his infection which I feel like is better. Unfortunately he is not doing as well with regard to the overall size of the wound which is not nearly as good at this point. I feel like that he may be having an issue here with the pyoderma being somewhat out of control. I think that he may benefit from potentially going back and talking to the dermatologist about TRISTRAM, MILIAN ([id number]) what to do from the pyoderma standpoint. I am not certain if the infusions are helping nearly as much is what the prednisone did in the past. 10/02/2020 upon evaluation today patient appears to be doing well with regard to his leg ulcer. He did go to the [facility] nurse. Unfortunately they feel like there is a 10% chance that most that he would be able to heal and that the skin graft would take. Obviously this has led him to not be able to go down that path as far as treatment is concerned. Nonetheless he does seem to be doing a little bit better with the prednisone that I gave him last time. I think that he may need to discuss with dermatology the possibility of long-term prednisone as that seems to be what is most helpful for him to be perfectly honest. I am not sure the Remicade is really doing the job. 10/17/2020 upon evaluation today patient appears to be doing a little better in regard to his wound. In fact the case has been since we did the prednisone on May 2 for him that we have noticed a little bit of improvement each time we have seen a size wise as well as appearance wise as well as pain wise. I think the prednisone has had a  greater effect then the infusion therapy has to be perfectly honest. With that being said the patient also feels significantly better compared to what he was previous. All of this  is good news but nonetheless I am still concerned about the fact that again we are really not set up to long-term manage him as far as prednisone is concerned. Obviously there are things that you need to be watched I completely understand the risk of prednisone usage as well. 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 With all that being said I do believe that we can give him another round of the prednisone which she is requesting today because of the improvement that he seen since we did that first round. 10/30/2020 upon evaluation today patient's wound actually is showing signs of doing quite well. There does not appear to be any evidence of infection which is great news and overall very pleased with where things stand today. No fevers, chills, nausea, vomiting, or diarrhea. He tells me that the prednisone still has seem to have helped he wonders if we can extend that for just a little bit longer. He did not have the appointment with a dermatologist although he did have an infusion appointment last Friday. That was at [facility]. With that being said he tells me he could not do both that as well as the appointment with the physician on the same day therefore that is can have to be rescheduled. I really want to see if there is anything they feel like that could be done differently to try to help this out as I am not really certain that the infusions are helping significantly here. 11/13/2020 upon evaluation today patient unfortunately appears to be doing somewhat poorly in regard to his wound I feel like this is actually worsening from the standpoint of the pyoderma spreading. I still feel like that he may need something different as far as trying to manage this going forward. Again we  did the prednisone unfortunately his blood sugars are not doing so well because of this. Nonetheless I believe that the patient likely needs to try topical steroid. We have done triamcinolone for a while I think going with something stronger such as clobetasol could be beneficial again this is not something I do lightly I discussed this with the patient that again this does not normally put underneath an occlusive dressing. Nonetheless I think a thin film as such could help with some of the stronger anti-inflammatory effects. We discussed this today. He would like to try to give this a trial for the next couple weeks. I definitely think that is something that we can do. Evaluate7/03/2021 and today patient's wound bed actually showed signs of doing really about the same. There was a little expansion of the size of the wound and that leading edge that we done looking out although the clobetasol does seem to have slowed this down a bit in my opinion. There is just 1 small area that still seems to be progressing based on what I see. Nonetheless I am concerned about the fact this does not seem to be improving if anything seems to be doing a little bit worse. I do not know that the infusions are really helping him much as next infusion is August 5 his appointment with dermatology is July 25. Either way I really think that we need to have a conversation potentially about this and I am actually going to see if I can talk with Dr. Lillia Carmel in order to see where things stand as well. 12/11/2020 upon evaluation today patient appears to be doing worse in regard to  his leg ulcer. Unfortunately I just do not think this is making the progress that I would like to see at this point. Honestly he does have an appointment with dermatology and this is in 2 days. I am wondering what they may have to offer to help with this. Right now what I am seeing is that he is continuing to show signs of worsening little by little.  Obviously that is not great at all. Is the exact opposite of what we are looking for. 12/18/2020 upon evaluation today patient appears to be doing a little better in regard to his wound. The dermatologist actually did do some steroid injections into the wound which does seem to have been beneficial in my opinion. That was on the 25th already this looks a little better to me than last time I saw him. With that being said we did do a culture and this did show that he has Staph aureus noted in abundance in the wound. With that being said I do think that getting him on an oral antibiotic would be appropriate as well. Also think we can compression wrap and this will make a difference as well. 12/28/2020 upon evaluation today patient's wound is actually showing signs of doing much better. I do believe the compression wrap is helping he has a lot of drainage but to be honest I think that the compression is helping to some degree in this regard as well as not draining through which is also good news. No fevers, chills, nausea, vomiting, or diarrhea. 01/04/2021 upon evaluation today patient appears to be doing well with regard to his wound. Overall things seem to be doing quite well. He did have a little bit of reaction to the CarboFlex Sorbact he will be using that any longer. With that being said he is controlled as far as the drainage is concerned overall and seems to be doing quite well. I do not see any signs of active infection at this time which is great news. No fevers, chills, nausea, vomiting, or diarrhea. 01/11/2021 upon evaluation today patient appears to be doing well with regard to his wounds. He has been tolerating the dressing changes without complication. Fortunately there does not appear to be any signs of active infection at this time which is great news. Overall I am extremely pleased with where we stand currently. No fevers, chills, nausea, vomiting, or diarrhea. Where using clobetasol in the  wound bed he has a lot of new skin growth which is awesome as well. 01/18/2021 upon evaluation today patient appears to be doing very well in regard to his leg ulcer. He has been tolerating the dressing changes without complication. Fortunately there does not appear to be any signs of active infection which is great news. In general I think that he is making excellent progress 01/25/2021 upon evaluation today patient appears to be doing well with regard to his wound on the leg. I am actually extremely pleased with where things stand today. There does not appear to be any signs of active infection which is great news and overall I think that we are definitely headed in the appropriate direction based on what I am seeing currently. There does not appear to be any signs of active infection also excellent news. 02/06/2021 upon evaluation today patient appears to be doing well with regard to his wound. Overall visually this is showing signs of significant improvement which is great news. I do not see any signs of active infection systemically which  is great even locally I do not think that we are seeing any major complications here. We did do fluorescence imaging with the [employer] today. The patient does have some odor and drainage noted and again this is something that I think would benefit him to probably come more frequently for nurse visits. 02/19/2021 upon evaluation today patient actually appears to be doing quite well in regard to his wound. He has been tolerating the dressing Kozub, Benjimen ([id number]) changes without complication and overall I think that this is making excellent progress. I do not see any evidence of active infection at this point which is great news as well. No fevers, chills, nausea, vomiting, or diarrhea. 10/10; wound is made nice progress healthy granulation with a nice rim of epithelialization which seems to be expanding even from last week he has a deeper area in the  inferior part of the more distal part of the wound with not quite as healthy as surface. This area will need to be followed. Using clobetasol and Hydrofera Blue 03/05/2021 upon evaluation today patient appears to be doing very well in regard to his leg ulcer. He has been tolerating dressing changes without complication. Fortunately there does not appear to be any signs of active infection which is great news and overall I am extremely pleased with where we stand currently. 03/12/2021 upon evaluation today patient appears to be doing well with regard to his wound in fact this is extremely extremely good based on what we are seeing today there does not appear to be any signs of active infection and overall I think that he is doing awesome from the standpoint of healing in general. I am extremely pleased with how things seem to be progressing with regard to this pyoderma. Clobetasol has done wonders for him. I think the compression wrapping has also been of great benefit. 03/19/2021 upon evaluation today patient appears to be doing well with regard to his wound. He is tolerating the dressing changes without complication. In fact I feel like that he is actually making excellent progress at this point based on what I am seeing. No fevers, chills, nausea, vomiting, or diarrhea. 03/26/2021 upon evaluation today patient appears to be doing well with regard to his wound. This again is measuring smaller and looking better. Again the progress is slow but nonetheless continual with what we have been seeing. I do believe that the current plan is doing awesome for him. 04/09/2021 upon evaluation today patient appears to be doing well with regard to his leg ulcer. This is showing signs of excellent improvement the muscle is completely closed over and there does not appear to be any evidence of inflammation at this point his drainage is significantly improved. Overall I think that he would be a good candidate for  looking into a skin substitute at this point as well. We will get a look into some approvals in that regard. Potentially TheraSkin as well as Apligraf could both be considered just depending on insurance coverage. 04/16/2021 upon evaluation today patient appears to be doing well at this point. He has been approved for the Apligraf which we could definitely order although I would like to try to get the TheraSkin approved if at all possible. I did fax notes into them today and I Georgina Peer try to give a call as well. Overall the wound appears to be doing decently well today. 04/20/2021 upon evaluation today patient actually appears to be doing quite well in regard to his wound with  that being said we are trying to see what we can do about speeding up the healing process. For that reason we did discuss the possibility of a skin substitute. We got the Apligraf approved. For that reason we will get a go ahead and see what we can do with the Apligraf at this point. I am still trying to get the TheraSkin approved but I have not heard anything from the insurance company yet I have called and talked to them earlier in the week and to be honest this was about half an hour that I spent on the phone they told me I would hear something within 10-15 business days. 04/27/2021 upon evaluation today patient appears to be doing excellent in regard to his wound. I do believe the Apligraf has been beneficial. With that being said he has had a little bit of increased pain has been a little bit concerned. I do want to go ahead and apply his steroid cream today the clobetasol and then put the Apligraf over I just do not think I want to risk not putting the clobetasol on based on what is been going on here currently. Patient voiced understanding. 05/04/2021 upon evaluation today patient is making excellent improvement. Overall there is definitely decrease in the size of the wound today and I am very pleased in that regard. I do not  see any signs of active infection locally nor systemically at this point. No fevers, chills, nausea, vomiting, or diarrhea. 05/11/2021 upon evaluation today patient appears to be doing well with regard to his wound. He has been tolerating Apligraf's without complication and is making excellent progress this is application #4 today. 12/30; patient here for Apligraf application. Appears to be doing well small wound there has been a lot of healing 05/28/2021 upon evaluation today patient appears to be doing well with regard to his wound. Has been tolerating the dressing changes without complication. Fortunately I do not see any signs of active infection locally nor systemically at this point which is great news. He is done with the Apligraf and the first round and overall this has filled in quite nicely I am not even certain he needs any additional at the moment. I would actually try to go back to clobetasol with [facility] which previously was doing well for him. 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 He is using clobetasol and Hydrofera Blue. Surface area of the wound is smaller and the surface looks very healthy. 06/11/2021 upon evaluation today patient actually appears to be showing signs of good improvement which is great news. Fortunately I do not see any signs of active infection at this time which is also excellent news. I do believe that the patient is doing well with the clobetasol and Hydrofera Blue. He does have some irritation and itching around the rest of the leg will use some triamcinolone over this area. 06/18/2021 upon evaluation today patient appears to be doing well with regard to his wound. He is showing signs of excellent improvement and overall I am extremely pleased with where we stand today. We are moving in the right direction. 06/25/2021 upon evaluation today patient appears to be doing well with regard to his wound. In fact  this is doing also not showing signs of excellent improvement overall very pleased with where we stand today. I do not see any evidence of active infection locally or systemically at this time which is also great news. 07/02/2021  upon evaluation patient's wound bed actually showed signs of good granulation and epithelization at this point. And this is measuring significantly smaller and overall I am extremely pleased with where we stand today. No fevers, chills, nausea, vomiting, or diarrhea. 07/09/2021 upon evaluation patient's wound bed actually showed signs of good granulation and epithelization at this point. Fortunately there does not appear to be any evidence of active infection locally or systemically which is great news and overall I am extremely pleased with where we stand at this point. 07/16/2021 upon evaluation today patient appears to be doing well with regard to his wound all things considered although it is maybe a little bit larger than what its been. This does have me a little concerned. Actually question about whether anything is changed and initially he told me KAYLIN, SCHELLENBERG ([id number]) know. In fact it was not until the end of the visit that he actually did mention that he had missed his infusion in January. This very well may be what is because the difference is also seeing currently. That definitely has seem to been helping more recently. 07/23/2021 upon evaluation today patient's wound is yet again larger despite the switch to a collagen which was not beneficial. Subsequently I am going to at this point check on a couple things. First and foremost I do want to go ahead and do a culture. I think that there is a chance he could have a low-lying infection that may be part of the issue here. Subsequently I am also probably can go ahead and place him on Bactrim DS which I think is a good option and has done well with them in the past. Again if this is an infection that should hopefully  help to turn things around. With all that being said I do believe that the patient should hopefully be able to turn this around with reinitiation of the infusion therapy which will be going on this week and subsequently getting on the antibiotic. 3/13; patient presents for follow-up. He has no issues or complaints today. He states he is currently on oral antibiotics for previous culture done in the office. 08/06/2021 upon evaluation today patient appears to be doing well with regard to his wound. I am actually seeing signs of improvement which is great news. I think that he is on a better track he did receive his infusion as well which is great news and overall I think that this should hopefully get him back on track as far as healing is concerned. He is not having any pain and I do not see any signs of inflammation which is great news. 08/13/2021 upon evaluation today patient appears to be doing excellent in regard to his wound on the leg. He has been tolerating the dressing changes without complication. Fortunately I do not see any signs of infection I do believe he is making good progress here and I think her back on track overall the tissue is greatly improved compared to what we have been. 08/20/2021 upon evaluation today patient appears to be doing excellent in regard to his wound. He has been tolerating the dressing changes without complication. Fortunately I do not see any evidence of infection at this time locally or systemically which is great news and overall very pleased with where we stand. 08-31-2021 upon evaluation today patient appears to be doing excellent in regard to his wound. Overall even with the vacation and extra walking he seems to have really done well. Fortunately I do not see  any evidence of active infection locally or systemically which is great news and overall I think we are headed in the right direction. 09-10-2021 upon evaluation today patient's wound is showing signs of  improvement. This is a small improvement but nonetheless we seem to be making progress here. I am very pleased in that regard. There does not appear to be any signs of infection and overall things are doing quite nicely. Upon inspection patient's wound bed actually showed signs of good granulation and epithelization at this point. There was some irritation around the edges of the wound where the good skin was it almost appears that something was rubbing or else is was due from drainage and irritation. Nonetheless I am going to see about putting a little bit of zinc just around the wound opening in order to protect the skin from being damaged. The patient is in agreement with that plan. 5/8; its been a while since I have seen this wound and it looks considerably better although still with some depth. Per our intake nurse the dimensions have improved patient has been using a thin layer of clobetasol, Hydrofera Blue under 4-layer compression. He appears to be making progress 10-01-2021 upon evaluation today patient appears to be doing well with regard to his wound although it is somewhat stalled I feel like were basically at a standstill here. He has previously had Apligraf which did very well for him to be honest. I do believe that he may benefit from a repeat of the Apligraf. In fact it got so small at that point but I do not think we even used all of the applications that were recommended as this had improved to such a degree. Nonetheless I do believe that the patient currently could benefit from repeat treatment with the Apligraf due to the improvement this that we did see. He is not opposed to this and subsequently working to look into getting approval to reinstitute that treatment. 10-08-2021 upon evaluation today patient appears to be doing well with regard to his wound. Overall I feel like he is doing a little bit better from the standpoint of irritation at this time which is great news. I do not  see any signs of active infection locally or systemically which is great news as well. No fevers, chills, nausea, vomiting, or diarrhea. 10-12-2021 upon evaluation today patient appears to be doing well with regard to his wound stable although we are can initiate treatment with Apligraf today. I am hoping this will stimulate things to improve. Patient is in agreement with that plan. 10-19-2028 upon evaluation today patient appears to be doing well with regard to his wound. He has been tolerating the dressing changes without complication. Fortunately there does not appear to be any evidence of active infection locally or systemically at this time which is great news. No fevers, chills, nausea, vomiting, or diarrhea. I do believe the Apligraf has improved the overall appearance of the wound bed receiving some definite improvements here. 10-26-2021 upon evaluation today patient's wound is actually shown some signs of really good improvement I am very pleased in this regard. There is a little bit of slough and biofilm buildup that is stuck to the surface of the wound I am getting very lightly debrided in order to clear this away and then subsequently will get a continue with the Apligraf application today. This is #3. 11-09-2021 upon evaluation today patient appears to be doing well currently in regard to his wound. This is actually showing  signs of significant improvement which is great news and overall I think the Apligraf is doing an amazing job. This wound is significantly smaller compared to the last time I saw him. This will be Apligraf application #4. 12-22-1322 upon evaluation today patient appears to be doing well currently in regard to his wound he is actually showing signs of excellent improvement which is great news and overall I am extremely pleased with where we stand today. I do not see any evidence of active infection locally or systemically at this time which is great news. No fevers, chills,  nausea, vomiting, or diarrhea. I believe the Apligraf is doing an awesome job and in fact I [place] see about getting an extension of application which hopefully should not be a problem. Especially in light of how much improvement he has had with regards to the size of the wound with treatment. This is application #5 today. 12-07-2021 upon evaluation today patient appears to be doing excellent in regard to his wound has been tolerating the dressing changes without complication. Fortunately there does not appear to be any signs of active infection locally or systemically at this time. 12-21-2021 upon evaluation today patient appears to be doing well currently in regard to his wound. We have gotten approval for a repeat Apligraf treatment. I think that is going to be a good way to wrap this up I really feel like we will likely be able to get the wound closed with the additional Strawderman, Manveer ([id number]) treatments that have been improved. 01-04-2022 upon evaluation patient appears to be doing excellent in regard to his wound. He is tolerating the dressing changes without complication and is measuring much better today as well. I am very pleased with where we stand. I am not even certain we will get have to use all 5 of the reapproved Apligraf but we will see how things go right now I am extremely pleased with the progress that is been made. 01-18-2022 upon evaluation today patient appears to be doing well with regard to his wound is actually measuring significantly better which is great news. This is still open but very minimally. I do think that it is appropriate to continue with the Apligraf he has done so well with this. 02-01-2022 upon evaluation today patient actually appears to be doing well currently with regard to his wound in fact this appears to be pretty much completely healed. There is just a very pinpoint area that is leaking a little clear fluid otherwise this is doing quite well. I do not see  any evidence of active infection at this time. 02-08-2022 upon evaluation today patient appears to be doing well currently in regard to his wound in fact this is showing signs of complete resolution as of today. Electronic Signature(s) Signed: 02/08/2022 9:23:53 AM By: Worthy Keeler PA-C Entered By: Worthy Keeler on 02/08/2022 09:23:52 DEVLON, DOSHER ([id number]) -------------------------------------------------------------------------------- Physical Exam Details Patient Name: [name] Date of Service: 02/08/2022 8:15 AM Medical Record Number: [id number] Patient Account Number: [id number] Date of Birth/Sex: 1981/04/14 (41 y.o. M) Treating RN: [name] Primary Care Provider: [name] Other Clinician: Massie Kluver Referring Provider: [name] Treating Provider/Extender: [name] Cliche in Treatment: 43 Constitutional Well-nourished and well-hydrated in no acute distress. Respiratory normal breathing without difficulty. Psychiatric this patient is able to make decisions and demonstrates good insight into disease process. Alert and Oriented x 3. pleasant and cooperative. Notes Upon inspection patient's wound bed actually showed signs of good granulation and  epithelization at this point. Fortunately I see no evidence of active infection locally or systemically which is great news and overall I am extremely pleased with where we stand at this point. I am extremely pleased with the fact that the patient appears to be completely healed today. This has been an extremely long time coming. Electronic Signature(s) Signed: 02/08/2022 9:24:31 AM By: Worthy Keeler PA-C Entered By: Worthy Keeler on 02/08/2022 09:24:31 JAMIRE, SHABAZZ ([id number]) -------------------------------------------------------------------------------- Physician Orders Details Patient Name: [name] Date of Service: 02/08/2022 8:15 AM Medical Record Number: [id number] Patient Account Number:  [id number] Date of Birth/Sex: 1980-09-25 (41 y.o. M) Treating RN: [name] Primary Care Provider: [name] Other Clinician: Massie Kluver Referring Provider: [name] Treating Provider/Extender: [name] Cliche in Treatment: [phone] Verbal / Phone Orders: No Diagnosis Coding ICD-10 Coding Code Description I87.2 Venous insufficiency (chronic) (peripheral) L97.222 Non-pressure chronic ulcer of left calf with fat layer exposed E11.622 Type 2 diabetes mellitus with other skin ulcer L88 Pyoderma gangrenosum F17.208 Nicotine dependence, unspecified, with other nicotine-induced disorders Follow-up Appointments o Return Appointment in 1 week. Bathing/ Shower/ Hygiene o Clean wound with Normal Saline or wound cleanser. o May shower with wound dressing protected with water repellent cover or cast protector. o No tub bath. Edema Control - Lymphedema / Segmental Compressive Device / Other o Optional: One layer of unna paste to top of compression wrap (to act as an anchor). - He needs this to hold it up o 4 Layer Compression System Lymphedema. o Elevate, Exercise Daily and Avoid Standing for Long Periods of Time. o Elevate legs to the level of the heart and pump ankles as often as possible o Elevate leg(s) parallel to the floor when sitting. Medications-Please add to medication list. o Other: - clobetasol to area, then Mepitel, steristrips, bolus Electronic Signature(s) Signed: 02/08/2022 1:10:36 PM By: Massie Kluver Signed: 02/08/2022 1:36:52 PM By: Worthy Keeler PA-C Entered By: Massie Kluver on 02/08/2022 08:48:06 [place], Tison ([id number]) -------------------------------------------------------------------------------- Problem List Details Patient Name: [name] Date of Service: 02/08/2022 8:15 AM Medical Record Number: [id number] Patient Account Number: [id number] Date of Birth/Sex: [birth year] (41 y.o. M) Treating RN: [name] Primary Care  Provider: [name] Other Clinician: Massie Kluver Referring Provider: [name] Treating Provider/Extender: [name] Cliche in Treatment: 270 Active Problems ICD-10 Encounter Code Description Active Date MDM Diagnosis I87.2 Venous insufficiency (chronic) (peripheral) 12/04/2016 No Yes L97.222 Non-pressure chronic ulcer of left calf with fat layer exposed 12/04/2016 No Yes E11.622 Type 2 diabetes mellitus with other skin ulcer 04/09/2021 No Yes L88 Pyoderma gangrenosum 03/26/2017 No Yes F17.208 Nicotine dependence, unspecified, with other nicotine-induced disorders 04/24/2020 No Yes Inactive Problems ICD-10 Code Description Active Date Inactive Date L97.213 Non-pressure chronic ulcer of right calf with necrosis of muscle 04/02/2017 04/02/2017 Resolved Problems ICD-10 Code Description Active Date Resolved Date L97.321 Non-pressure chronic ulcer of left ankle limited to breakdown of skin 10/08/2019 10/08/2019 L03.116 Cellulitis of left lower limb 05/24/2019 05/24/2019 Electronic Signature(s) Signed: 02/08/2022 8:15:56 AM By: Worthy Keeler PA-C Entered By: Worthy Keeler on 02/08/2022 08:15:55 Vanalstine, Kaio ([id number]) -------------------------------------------------------------------------------- Progress Note Details Patient Name: [name] Date of Service: 02/08/2022 8:15 AM Medical Record Number: [id number] Patient Account Number: [id number] Date of Birth/Sex: 1980-08-30 (41 y.o. M) Treating RN: [name] Primary Care Provider: [name] Other Clinician: Massie Kluver Referring Provider: [name] Treating Provider/Extender: [name] Cliche in Treatment: 270 Subjective Chief Complaint Information obtained from Patient He is here in follow up evaluation for LLE  pyoderma ulcer History of Present Illness (HPI) 12/04/16; 41 year old man who comes into the clinic today for review of a wound on the posterior left calf. He tells me that is been there  for about a year. He is not a diabetic he does smoke half a pack per day. He was seen in the ER on 11/20/16 felt to have cellulitis around the wound and was given clindamycin. An x-ray did not show osteomyelitis. The patient initially tells me that he has a milk allergy that sets off a pruritic itching rash on his lower legs which she scratches incessantly and he thinks that's what may have set up the wound. He has been using various topical antibiotics and ointments without any effect. He works in a [employer]ing [employer] and is on his feet all day. He does not have a prior history of wounds however he does have the rash on both lower legs the right arm and the ventral aspect of his left arm. These are excoriations and clearly have had scratching however there are of macular looking areas on both legs including a substantial larger area on the right leg. This does not have an underlying open area. There is no blistering. The patient tells me that 2 years ago in [place] in response to the rash on his legs he saw a dermatologist who told him he had a condition which may be pyoderma gangrenosum although I may be putting words into his mouth. He seemed to recognize this. On further questioning he admits to a 5 year history of quiesced. ulcerative colitis. He is not in any treatment for this. He's had no recent travel 12/11/16; the patient arrives today with his wound and roughly the same condition we've been using silver alginate this is a deep punched out wound with some surrounding erythema but no tenderness. Biopsy I did did not show confirmed pyoderma gangrenosum suggested nonspecific inflammation and vasculitis but does not provide an actual description of what was seen by the pathologist. I'm really not able to understand this We have also received information from the patient's dermatologist in [place] notes from April 2016. This was a doctor Agarwal-antal. 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  He was prescribed topical steroid high potency under occlusion which helped but at this point the patient did not have a deep punched out wound. 12/18/16; the patient's wound is larger in terms of surface area however this surface looks better and there is less depth. The surrounding erythema also is better. The patient states that the wrap we put on came off 2 days ago when he has been using his compression stockings. He we are in the process of getting a dermatology consult. 12/26/16 on evaluation today patient's left lower extremity wound shows evidence of infection with surrounding erythema noted. He has been tolerating the dressing changes but states that he has noted more discomfort. There is a larger area of erythema surrounding the wound. No fevers, chills, nausea, or vomiting noted at this time. With that being said the wound still does have slough covering the surface. He is not allergic to any medication that he is aware of at this point. In regard to his right lower extremity he had several regions that are erythematous and pruritic he wonders if there's anything we can do to help that. 01/02/17 I reviewed patient's wound culture which was obtained his visit last week. He was placed on doxycycline at that point. Unfortunately that does not appear to be an  antibiotic that would likely help with the situation however the pseudomonas noted on culture is sensitive to Cipro. Also unfortunately patient's wound seems to have a large compared to last week's evaluation. Not severely so but there are definitely increased measurements in general. He is continuing to have discomfort as well he writes this to be a seven out of 10. In fact he would prefer me not to perform any debridement today due to the fact that he is having discomfort and considering he has an active infection on the little reluctant to do so anyway. No fevers, chills, nausea, or vomiting noted at this time. 01/08/17; patient seems  dermatology on September 5. I suspect dermatology will want the slides from the biopsy I did sent to their pathologist. I'm not sure if there is a way we can expedite that. In any case the culture I did before I left on vacation 3 weeks ago showed Pseudomonas he was given 10 days of Cipro and per her description of her intake nurses is actually somewhat better this week although the wound is quite a bit bigger than I remember the last time I saw this. He still has 3 more days of Cipro 01/21/17; dermatology appointment tomorrow. He has completed the ciprofloxacin for Pseudomonas. Surface of the wound looks better however he is had some deterioration in the lesions on his right leg. Meantime the left lateral leg wound we will continue with sample 01/29/17; patient had his dermatology appointment but I can't yet see that note. He is completed his antibiotics. The wound is more superficial but considerably larger in circumferential area than when he came in. This is in his left lateral calf. He also has swollen erythematous areas with superficial wounds on the right leg and small papular areas on both arms. There apparently areas in her his upper thighs and buttocks I did not look at those. Dermatology biopsied the right leg. Hopefully will have their input next week. 02/05/17; patient went back to see his dermatologist who told him that he had a "scratching problem" as well as staph. He is now on a 30 day course of doxycycline and I believe she gave him triamcinolone cream to the right leg areas to help with the itching [not exactly sure but probably triamcinolone]. She apparently looked at the left lateral leg wound although this was not rebiopsied and I think felt to be ultimately part of the same pathogenesis. He is using sample border foam and changing nevus himself. He now has a new open area on the right posterior leg which was his biopsy site I don't have any of the dermatology notes 02/12/17; we put  the patient in compression last week with SANTYL to the wound on the left leg and the biopsy. Edema is much better and the depth of the wound is now at level of skin. Area is still the same Biopsy site on the right lateral leg we've also been using santyl with a border foam dressing and he is changing this himself. 02/19/17; Using silver alginate started last week to both the substantial left leg wound and the biopsy site on the right wound. He is tolerating compression well. Has a an appointment with his primary M.D. tomorrow wondering about diuretics although I'm wondering if the edema problem is actually lymphedema KIPTYN, RAFUSE ([id number]) 02/26/17; the patient has been to see his primary doctor Dr. Jerrel Ivory at [facility] our primary [facility]. She started him on Lasix 20 mg and this seems to have  helped with the edema. However we are not making substantial change with the left lateral calf wound and inflammation. The biopsy site on the right leg also looks stable but not really all that different. 03/12/17; the patient has been to see vein and vascular Dr. Lucky Cowboy. He has had venous reflux studies I have not reviewed these. I did get a call from his dermatology office. 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 They therefore recommended clobetasol and tetracycline i.e. aggressive treatment for possible pyoderma gangrenosum. 03/26/17; apparently the patient just had reflux studies not an appointment with Dr. dew. She arrives in clinic today having applied clobetasol for 2-3 weeks. He notes over the last 2-3 days excessive drainage having to change the dressing 3-4 times a day and also expanding erythema. 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 Lillia Carmel at [facility] dermatology locally who kindly saw him at my request. 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 My concern was the degree of erythema medially extending from his left leg wound which was either inflammation from pyoderma or cellulitis. I put him on Augmentin however culture of the wound showed Pseudomonas which is quinolone sensitive. I really don't believe he has cellulitis however in view of everything I will continue and give him a course of Cipro. He is also on doxycycline as an immune modulator for the pyoderma. In addition to his original wound on the left lateral leg with surrounding erythema he has a wound on the right posterior calf which was an original biopsy site done by dermatology. This was felt to represent pathergy from pyoderma gangrenosum 04/16/17; pyoderma gangrenosum. Saw Dr. Lillia Carmel yesterday. He has been using topical antibiotics to both wound areas his original wound on the left and the biopsies/pathergy area on the right. There is definitely some improvement in the inflammation around the wound on the right although the patient states he has increasing sensitivity of the wounds. He is on prednisone 60 and doxycycline 1 as prescribed by Dr. Lillia Carmel. He is covering the topical antibiotic with gauze and putting this in his own compression stocks and changing this daily. He states that Dr. Lottie Rater did a culture of the left leg wound yesterday 05/07/17; pyoderma gangrenosum. The patient saw Dr. Lillia Carmel yesterday and has a follow-up with her in one month. He is still using topical antibiotics to both wounds although he can't  recall exactly what type. He is still on prednisone 60 mg. Dr. Lillia Carmel stated that the doxycycline could stop if we were in agreement. 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 The right medial leg was induced by biopsy/pathergy. The area on the right is essentially healed. Still on high-dose prednisone using topical antibiotics to the wound 07/09/17; pyoderma gangrenosum with wounds on the left lateral leg. The right medial leg has closed and  remains closed. He is still on prednisone 60. He tells me he missed his last dermatology appointment with Dr. Lillia Carmel but will make another appointment. He reports that her blood sugar at a recent screen in [place] was high 200's. He was 180 today. 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 I think he is going to require still much longer prednisone perhaps another 3 months before attempting to taper. In the meantime his wound is a lot better. Smaller. He is cleaning this off daily and applying topical antibiotics. When he was last in the clinic I thought about changing to [facility] and actually put in a couple of calls to dermatology although probably not during their business hours. In any case the wound looks better smaller I don't think there is any need to change what he is doing 08/06/17-he is here in follow up evaluation for pyoderma left leg ulcer. He continues on oral prednisone. He has been using triple antibiotic ointment. There is surface debris and we will transition to [facility] and have him return in 2 weeks. He has lost 30 pounds since his last appointment with lifestyle modification. He may benefit from topical steroid cream for treatment this can be considered at a later date. 08/22/17 on evaluation today patient appears to actually be doing rather well in regard to his left lateral lower extremity ulcer. He has actually been managed by  Dr. Dellia Nims most recently. Patient is currently on oral steroids at this time. This seems to have been of benefit for him. Nonetheless his last visit was actually with Leah on 08/06/17. Currently he is not utilizing any topical steroid creams although this could be of benefit as well. No fevers, chills, nausea, or vomiting noted at this time. 09/05/17 on evaluation today patient appears to be doing better in regard to his left lateral lower extremity ulcer. He has been tolerating the dressing changes without complication. He is using Santyl with good effect. Overall I'm very pleased with how things are standing at this point. Patient likewise is happy that this is doing better. 09/19/17 on evaluation today patient actually appears to be doing rather well in regard to his left lateral lower extremity ulcer. Again this is secondary to Pyoderma gangrenosum and he seems to be progressing well with the Santyl which is good news. He's not having any significant pain. 10/03/17 on evaluation today patient appears to be doing excellent in regard to his lower extremity wound on the left secondary to Pyoderma gangrenosum. He has been tolerating the Santyl without complication and in general I feel like he's making good progress. 10/17/17 on evaluation today patient appears to be doing very well in regard to his left lateral lower surety ulcer. He has been tolerating the dressing changes without complication. There does not appear to be any evidence of infection he's alternating the Santyl and the triple antibiotic ointment every other day this seems to be doing well for him. 11/03/17 on evaluation today patient appears to be doing very well in regard to his left lateral lower extremity ulcer. He is been tolerating the dressing changes without complication which is good news. Fortunately there does not appear to be any evidence of infection which is also great news. Overall is doing excellent they are starting to taper  down on the prednisone is down to 40 mg at this point it also started topical clobetasol for him. 11/17/17 on evaluation today patient appears to be doing well in regard to his left lateral  lower surety ulcer. He's been tolerating the dressing changes without complication. He does note that he is having no pain, no excessive drainage or discharge, and overall he feels like things are going about how he would expect and hope they would. Overall he seems to have no evidence of infection at this time in my opinion which is good news. 12/04/17-He is seen in follow-up evaluation for right lateral lower extremity ulcer. He has been applying topical steroid cream. Today's measurement show slight increase in size. Over the next 2 weeks we will transition to every other day Santyl and steroid cream. He has been encouraged to monitor for changes and notify clinic with any concerns 12/15/17 on evaluation today patient's left lateral motion the ulcer and fortunately is doing worse again at this point. This just since last week to this week has close to doubled in size according to the patient. I did not seeing last week's I do not have a visual to compare this to in our system was also down so we do not have all the charts and at this point. Nonetheless it does have me somewhat concerned in regard to the fact that again he was worried enough about it he has contact the dermatology that placed them back on the full strength, 50 mg a day of the prednisone that he was taken previous. He continues to alternate using clobetasol along with Santyl at this point. He is obviously somewhat frustrated. JAMAR, WEATHERALL ([id number]) 12/22/17 on evaluation today patient appears to be doing a little worse compared to last evaluation. Unfortunately the wound is a little deeper and slightly larger than the last week's evaluation. With that being said he has made some progress in regard to the irritation surrounding at this  time unfortunately despite that progress that's been made he still has a significant issue going on here. 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 For that reason I discussed with him today the possibility of perform a wound culture to ensure there's nothing overtly infected. 01/06/18 on evaluation today patient's wound is larger and deeper than previously evaluated. With that being said it did appear that his wound was infected after my last evaluation with him. Subsequently I did end up prescribing a prescription for Bactrim DS which she has been taking and having no complication with. Fortunately there does not appear to be any evidence of infection at this point in time as far as anything spreading, no want to touch, and overall I feel like things are showing signs of improvement. 01/13/18 on evaluation today patient appears to be even a little larger and deeper than last time. There still muscle exposed in the base of the wound. Nonetheless he does appear to be less erythematous I do believe inflammation is calming down also believe the infection looks like it's probably resolved at this time based on what I'm seeing. No fevers, chills, nausea, or vomiting noted at this time. 01/30/18 on evaluation today patient actually appears to visually look better for the most part. Unfortunately those visually this looks better he does seem to potentially have what may be an abscess in the muscle that has been noted in the central portion of the wound. This is the first time that I have noted what appears to be fluctuance in the central portion of the muscle. With that being said I'm somewhat  more concerned about the fact that this might indicate an abscess formation at this location. I do believe that an ultrasound would be appropriate. This is  likely something we need to try to do as soon as possible. He has been switch to mupirocin ointment and he is no longer using the steroid ointment as prescribed by dermatology he sees them again next week he's been decreased from 60 to 40 mg of prednisone. 03/09/18 on evaluation today patient actually appears to be doing a little better compared to last time I saw him. There's not as much erythema surrounding the wound itself. He I did review his most recent infectious disease note which was dated 02/24/18. He saw Dr. Michel Bickers in [place]. 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 Patient is now off of antibiotics and they are continually observing this. There seems to be no change in the past two weeks in my pinion based on what the patient says and what I see today compared to what Dr. Megan Salon likely saw two weeks ago. No fevers, chills, nausea, or vomiting noted at this time. 03/23/18 on evaluation today patient's wound actually appears to be showing signs of improvement which is good news. He is currently still on the Dapsone. He is also working on tapering the prednisone to get off of this and Dr. Lottie Rater is working with him in this regard. Nonetheless overall I feel like the wound is doing well it does appear based on the infectious disease note that I reviewed from Dr. 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 I did also review the results of his ultrasound of left lower extremity which revealed there was a dentist tissue in the base of the wound without an abscess noted. 04/06/18 on evaluation today the patient's left lateral lower extremity ulcer actually appears to be doing fairly well which is excellent news. There does not appear to be any evidence of infection at this time which  is also great news. Overall he still does have a significantly large ulceration although little by little he seems to be making progress. He is down to 10 mg a day of the prednisone. 04/20/18 on evaluation today patient actually appears to be doing excellent at this time in regard to his left lower extremity ulcer. He's making signs of good progress unfortunately this is taking much longer than we would really like to see but nonetheless he is making progress. Fortunately there does not appear to be any evidence of infection at this time. No fevers, chills, nausea, or vomiting noted at this time. The patient has not been using the Santyl due to the cost he hadn't got in this field yet. He's mainly been using the antibiotic ointment topically. Subsequently he also tells me that he really has not been scrubbing in the shower I think this would be helpful again as I told him it doesn't have to be anything too aggressive to even make it believe just enough to keep it free of some of the loose slough and biofilm on the wound surface. 05/11/18 on evaluation today patient's wound appears to be making slow but sure progress in regard to the left lateral lower extremity ulcer. He is been tolerating the dressing changes without complication. Fortunately there does not appear  to be any evidence of infection at this time. He is still just using triple antibiotic ointment along with clobetasol occasionally over the area. He never got the Santyl and really does not seem to intend to in my pinion. 06/01/18 on evaluation today patient appears to be doing a little better in regard to his left lateral lower extremity ulcer. He states that overall he does not feel like he is doing as well with the Dapsone as he did with the prednisone. Nonetheless he sees his dermatologist later today and is gonna talk to them about the possibility of going back on the prednisone. Overall again I believe that the wound would be better if  you would utilize Santyl but he really does not seem to be interested in going back to the [place] at this point. He has been using triple antibiotic ointment. 06/15/18 on evaluation today patient's wound actually appears to be doing about the same at this point. Fortunately there is no signs of infection at this time. He has made slight improvements although he continues to not really want to clean the wound bed at this point. He states that he just doesn't mess with it he doesn't want to cause any problems with everything else he has going on. He has been on medication, antibiotics as prescribed by his dermatologist, for a staff infection of his lower extremities which is really drying out now and looking much better he tells me. Fortunately there is no sign of overall infection. 06/29/18 on evaluation today patient appears to be doing well in regard to his left lateral lower surety ulcer all things considering. Fortunately his staff infection seems to be greatly improved compared to previous. He has no signs of infection and this is drying up quite nicely. He is still the doxycycline for this is no longer on cental, Dapsone, or any of the other medications. His dermatologist has recommended possibility of an infusion but right now he does not want to proceed with that. 07/13/18 on evaluation today patient appears to be doing about the same in regard to his left lateral lower surety ulcer. Fortunately there's no signs of infection at this time which is great news. Unfortunately he still builds up a significant amount of Slough/biofilm of the surface of the wound he still is not really cleaning this as he should be appropriately. Again I'm able to easily with saline and gauze remove the majority of this on the surface which if you would do this at home would likely be a dramatic improvement for him as far as getting the area to improve. Nonetheless overall I still feel like he is making progress is just  very slow. I think Santyl will be of benefit for him as well. Still he has not gotten this as of this point. 07/27/18 on evaluation today patient actually appears to be doing little worse in regards of the erythema around the periwound region of the wound he also tells me that he's been having more drainage currently compared to what he was experiencing last time I saw him. He states not quite as bad as what he had because this was infected previously but nonetheless is still appears to be doing poorly. Fortunately there is no evidence of systemic infection at this point. The patient tells me that he is not going to be able to afford the Santyl. He is still waiting to hear about the infusion therapy with his dermatologist. Apparently she wants an updated colonoscopy first. Thornley, Izeah (  [id number]) 08/10/18 on evaluation today patient appears to be doing better in regard to his left lateral lower extremity ulcer. Fortunately he is showing signs of improvement in this regard he's actually been approved for Remicade infusion's as well although this has not been scheduled as of yet. Fortunately there's no signs of active infection at this time in regard to the wound although he is having some issues with infection of the right lower extremity is been seen as dermatologist for this. Fortunately they are definitely still working with him trying to keep things under control. 09/07/18 on evaluation today patient is actually doing rather well in regard to his left lateral lower extremity ulcer. He notes these actually having some hair grow back on his extremity which is something he has not seen in years. He also tells me that the pain is really not giving them any trouble at this time which is also good news overall she is very pleased with the progress he's using a combination of the mupirocin along with the probate is all mixed. 09/21/18 on evaluation today patient actually appears to be doing fairly well all  things considered in regard to his looks from the ulcer. He's been tolerating the dressing changes without complication. Fortunately there's no signs of active infection at this time which is good news he is still on all antibiotics or prevention of the staff infection. He has been on prednisone for time although he states it is gonna contact his dermatologist and see if she put them on a short course due to some irritation that he has going on currently. Fortunately there's no evidence of any overall worsening this is going very slow I think cental would be something that would be helpful for him although he states that $50 for tube is quite expensive. He therefore is not willing to get that at this point. 10/06/18 on evaluation today patient actually appears to be doing decently well in regard to his left lateral leg ulcer. He's been tolerating the dressing changes without complication. Fortunately there's no signs of active infection at this time. Overall I'm actually rather pleased with the progress he's making although it's slow he doesn't show any signs of infection and he does seem to be making some improvement. I do believe that he may need a switch up and dressings to try to help this to heal more appropriately and quickly. 10/19/18 on evaluation today patient actually appears to be doing better in regard to his left lateral lower extremity ulcer. This is shown signs of having much less Slough buildup at this point due to the fact he has been using the [employer]. Obviously this is very good news. The overall size of the wound is not dramatically smaller but again the appearance is. 11/02/18 on evaluation today patient actually appears to be doing quite well in regard to his lower [place] ulcer. A lot of the skin around the ulcer is actually somewhat irritating at this point this seems to be more due to the dressing causing irritation from the adhesive that anything else. Fortunately there is no  signs of active infection at this time. 11/24/18 on evaluation today patient appears to be doing a little worse in regard to his overall appearance of his lower extremity ulcer. There's more erythema and warmth around the wound unfortunately. He is currently on doxycycline which he has been on for some time. With that being said I'm not sure that seems to be helping with what appears to possibly  be an acute cellulitis with regard to his left lower extremity ulcer. No fevers, chills, nausea, or vomiting noted at this time. 12/08/18 on evaluation today patient's wounds actually appears to be doing significantly better compared to his last evaluation. He has been using Santyl along with alternating tripling about appointment as well as the steroid cream seems to be doing quite well and the wound is showing signs of improvement which is excellent news. Fortunately there's no evidence of infection and in fact his culture came back negative with only normal skin flora noted. 12/21/2018 upon evaluation today patient actually appears to be doing excellent with regard to his ulcer. This is actually the best that I have seen it since have been helping to take care of him. It is both smaller as well as less slough noted on the surface of the wound and seems to be showing signs of good improvement with new skin growing from the edges. He has been using just the triamcinolone he does wonder if he can get a refill of that ointment today. 01/04/2019 upon evaluation today patient actually appears to be doing well with regard to his left lateral lower extremity ulcer. With that being said it does not appear to be that he is doing quite as well as last time as far as progression is concerned. There does not appear to be any signs of infection or significant irritation which is good news. With that being said I do believe that he may benefit from switching to a collagen based dressing based on how clean The wound  appears. 01/18/2019 on evaluation today patient actually appears to be doing well with regard to his wound on the left lower extremity. He is not made a lot of progress compared to where we were previous but nonetheless does seem to be doing okay at this time which is good news. There is no signs of active infection which is also good news. My only concern currently is I do wish we can get him into utilizing the collagen dressing his insurance would not pay for the supplies that we ordered although it appears that he may be able to order this through his supply company that he typically utilizes. This is [employer]. Nonetheless he did try to order it during the office visit today and it appears this did go through. We will see if he can get that it is a different brand but nonetheless he has collagen and I do think will be beneficial. 02/01/2019 on evaluation today patient actually appears to be doing a little worse today in regard to the overall size of his wounds. Fortunately there is no signs of active infection at this time. That is visually. Nonetheless when this is happened before it was due to infection. For that reason were somewhat concerned about that this time as well. 02/08/2019 on evaluation today patient unfortunately appears to be doing slightly worse with regard to his wound upon evaluation today. Is measuring a little deeper and a little larger unfortunately. I am not really sure exactly what is causing this to enlarge he actually did see his dermatologist she is going to see about initiating Humira for him. Subsequently she also did do steroid injections into the wound itself in the periphery. Nonetheless still nonetheless he seems to be getting a little bit larger he is gone back to just using the steroid cream topically which I think is appropriate. I would say hold off on the collagen for the time being is  definitely a good thing to do. Based on the culture results which we finally did  get the final result back regarding it shows staph as the bacteria noted again that can be a normal skin bacteria based on the fact however he is having increased drainage and worsening of the wound measurement wise I would go ahead and place him on an antibiotic today I do believe for this. 02/15/2019 on evaluation today patient actually appears to be doing somewhat better in regard to his ulcer. There is no signs of worsening at this time I did review his culture results which showed evidence of Staphylococcus aureus but not MRSA. Again this could just be more related to the normal skin bacteria although he states the drainage has slowed down quite a bit he may have had a mild infection not just colonization. And was much smaller and then since around10/04/2019 on evaluation today patient appears to be doing unfortunately worse as far as the size of the wound. I really feel like that this is steadily getting larger again it had been doing excellent right at the beginning of September we have seen a steady increase in the area of the wound it is almost 2-1/2 times the size it was on September 1. Obviously this is a bad trend this is not wanting to see. For that reason we went back to using just the topical triamcinolone cream which does seem to help with inflammation. I checked him for bacteria by way of culture and nothing showed positive there. I am considering giving him a short course of a tapering steroid Irfan Veal, Kirklin ([id number]) today to see if that is can be beneficial for him. The patient is in agreement with giving that a try. 03/08/2019 on evaluation today patient appears to be doing very well in comparison to last evaluation with regard to his lower extremity ulcer. This is showing signs of less inflammation and actually measuring slightly smaller compared to last time every other week over the past month and a half he has been measuring larger larger larger. Nonetheless I do  believe that the issue has been inflammation the prednisone does seem to have been beneficial for him which is good news. No fevers, chills, nausea, vomiting, or diarrhea. 03/22/2019 on evaluation today patient appears to be doing about the same with regard to his leg ulcer. He has been tolerating the dressing changes without complication. With that being said the wound seems to be mostly arrested at its current size but really is not making any progress except for when we prescribed the prednisone. He did show some signs of dropping as far as the overall size of the wound during that interval week. Nonetheless this is something he is not on long-term at this point and unfortunately I think he is getting need either this or else the Humira which his dermatologist has discussed try to get approval for. With that being said he will be seeing his dermatologist on the 11th of this month that is November. 04/19/2019 on evaluation today patient appears to be doing really about the same the wound is measuring slightly larger compared to last time I saw him. He has not been into the office since November 2 due to the fact that he unfortunately had Covid as that his entire family. He tells me that it was rough but they did pull-through and he seems to be doing much better. Fortunately there is no signs of active infection at this time. No fevers,  chills, nausea, vomiting, or diarrhea. 05/10/2019 on evaluation today patient unfortunately appears to be doing significantly worse as compared to last time I saw him. He does tell me that he has had his first dose of Humira and actually is scheduled to get the next one in the upcoming week. With that being said he tells me also that in the past several days he has been having a lot of issues with green drainage she showed me a picture this is more blue-green in color. He is also been having issues with increased sloughy buildup and the wound does appear to be larger  today. Obviously this is not the direction that we want everything to take based on the starting of his Humira. Nonetheless I think this is definitely a result of likely infection and to be honest I think this is probably Pseudomonas causing the infection based on what I am seeing. 05/24/2019 on evaluation today patient unfortunately appears to be doing significantly worse compared to his prior evaluation with me 2 weeks ago. I did review his culture results which showed that he does have Staph aureus as well as Pseudomonas noted on the culture. Nonetheless the Levaquin that I prescribed for him does not appear to have been appropriate and in fact he tells me he is no longer experiencing the green drainage and discharge that he had at the last visit. Fortunately there is no signs of active infection at this time which is good news although the wound has significantly worsened it in fact is much deeper than it was previous. We have been utilizing up to this point triamcinolone ointment as the prescription topical of choice but at this time I really feel like that the wound is getting need to be packed in order to appropriately manage this due to the deeper nature of the wound. Therefore something along the lines of an alginate dressing may be more appropriate. 05/31/2019 upon inspection today patient's wound actually showed signs of doing poorly at this point. Unfortunately he just does not seem to be making any good progress despite what we have tried. He actually did go ahead and pick up the Cipro and start taking that as he was noticing more green drainage he had previously completed the Levaquin that I prescribed for him as well. Nonetheless he missed his appointment for the seventh last week on Wednesday with the wound care center and [name] where his dermatologist referred him. Obviously I do think a second opinion would be helpful at this point especially in light of the fact that the  patient seems to be doing so poorly despite the fact that we have tried everything that I really know how at this point. The only thing that ever seems to have helped him in the past is when he was on high doses of continual steroids that did seem to make a difference for him. 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 06/07/2019 upon evaluation today patient unfortunately appears to be doing worse yet again with regard to his wound. In fact I am starting to question whether or not he may have a fluid pocket in the muscle at this point based on the bulging and the soft appearance to the central portion of the muscle area. There is not anything draining from  the muscle itself at this time which is good news but nonetheless the wound is expanding. I am not really seeing any results of the Humira as far as overall wound progression based on what I am seeing at this point. The patient has been referred for second opinion with regard to his wound to the [facility] wound care center by his dermatologist which I definitely am not in opposition to. Unfortunately we tried multiple dressings in the past including collagen, alginate, and at one point even Hydrofera Blue. With that being said he is never really used it for any significant amount of time due to the fact that he often complains of pain associated with these dressings and then will go back to either using the Santyl which she has done intermittently or more frequently the triamcinolone. He is also using his own compression stockings. We have wrapped him in the past but again that was something else that he really was not a big fan of. 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 He seemed to do much better when he was on the steroids although honestly I understand that the steroids are not necessarily the best medication to be on long-term obviously 06/14/2019 on evaluation today patient appears to be doing actually a little bit better with regard to the overall appearance with his leg. Unfortunately he does continue to have issues with what appears to be some fluid underneath the muscle although he did see the wound specialty center at [facility] last week their main goals were to see about infusion therapy in place of the Humira as they feel like that is not quite strong enough. They also recommended that we continue with the treatment otherwise as we are they felt like that was appropriate and they are okay with him continuing to follow-up here with [place] in that regard. With that being said they are also sending him to the vein specialist there to see about vein stripping and if that would be of benefit for him. Subsequently they also did not really address whether or not an ultrasound of the muscle area to see if there is anything that needs to be addressed here would be appropriate or not. For that reason I discussed this with him last week I think we may proceed down that road at this point. 06/21/2019 upon evaluation today patient's wound actually appears to be doing slightly better compared to previous evaluations. I do believe that he has made a difference with regard to the progression here with the use of oral steroids. Again in the past has been the only thing that is really calm things down. He does tell me that from [facility] is gotten a good news from there that there are no further vein stripping that is necessary at this point. I do not have that available for review today although  the patient did relay this to me. He also did obtain and have the ultrasound of the wound completed which I did sign off on today. It does appear that there is no fluid collection under the muscle this is likely then just edematous tissue in general. That is  also good news. Overall I still believe the inflammation is the main issue here. He did inquire about the possibility of a wound VAC again with the muscle protruding like it is I am not really sure whether the wound VAC is necessarily ideal or not. That is something we will have to consider although I do believe he may need compression wrapping to try to help with edema control which could potentially be of benefit. 06/28/2019 on evaluation today patient appears to be doing slightly better measurement wise although this is not terribly smaller he least seems to be trending towards that direction. With that being said he still seems to have purulent drainage noted in the wound bed at this time. He has been on Levaquin followed by Cipro over the past month. Unfortunately he still seems to have some issues with active infection at this time. I did perform a culture last week in order to evaluate and see if indeed there was still anything going on. Subsequently the culture did come back Whittle, Corby ([id number]) showing Pseudomonas which is consistent with the drainage has been having which is blue-green in color. He also has had an odor that again was somewhat consistent with Pseudomonas as well. Long story short it appears that the culture showed an intermediate finding with regard to how well the Cipro will work for the Pseudomonas infection. Subsequently being that he does not seem to be clearing up and at best what we are doing is just keeping this at [place] I think he may need to see infectious disease to discuss IV antibiotic options. 07/05/2019 upon evaluation today patient appears to be doing okay in regard to his leg ulcer. He has been tolerating  the dressing changes at this point without complication. Fortunately there is no signs of active infection at this time which is good news. No fevers, chills, nausea, vomiting, or diarrhea. With that being said he does have an appointment with infectious disease tomorrow and his primary care on Wednesday. Again the reason for the infectious disease referral was due to the fact that he did not seem to be fully resolving with the use of oral antibiotics and therefore we were thinking that IV antibiotic therapy may be necessary secondary to the fact that there was an intermediate finding for how effective the Cipro may be. Nonetheless again he has been having a lot of purulent and even green drainage. Fortunately right now that seems to have calmed down over the past week with the reinitiation of the oral antibiotic. Nonetheless we will see what Dr. Megan Salon has to say. 07/12/2019 upon evaluation today patient appears to be doing about the same at this point in regard to his left lower extremity ulcer. Fortunately there is no signs of active infection at this time which is good news I do believe the Levaquin has been beneficial I did review Dr. Hale Bogus note and to be honest I agree that the patient's leg does appear to be doing better currently. What we found in the past as he does not seem to really completely resolve he will stop the antibiotic and then subsequently things will revert back to having issues with blue-green drainage, increased pain, and overall worsening in general. Obviously that is the reason I sent him back to infectious disease. 07/19/2019 upon evaluation today patient appears to be doing roughly the same in size there is really no dramatic improvement. He has started back on the Levaquin at this point and though he seems to  be doing okay he did still have a lot of blue/green drainage noted on evaluation today unfortunately. I think that this is still indicative more likely of a  Pseudomonas infection as previously noted and again he does see Dr. Megan Salon in just a couple of days. I do not know that were really able to effectively clear this with just oral antibiotics alone based on what I am seeing currently. Nonetheless we are still continue to try to manage as best we can with regard to the patient and his wound. I do think the wrap was helpful in decreasing the edema which is excellent news. No fevers, chills, nausea, vomiting, or diarrhea. 07/26/2019 upon evaluation today patient appears to be doing slightly better with regard to the overall appearance of the muscle there is no dark discoloration centrally. Fortunately there is no signs of active infection at this time. No fevers, chills, nausea, vomiting, or diarrhea. Patient's wound bed currently the patient did have an appointment with Dr. Megan Salon at infectious disease last week. With that being said Dr. Megan Salon the patient states was still somewhat hesitant about put him on any IV antibiotics he wanted [place] to repeat cultures today and then see where things go going forward. He does look like Dr. Megan Salon because of some improvement the patient did have with the Levaquin wanted [place] to see about repeating cultures. If it indeed grows the Pseudomonas again then he recommended a possibility of considering a PICC line placement and IV antibiotic therapy. He plans to see the patient back in 1 to 2 weeks. 08/02/2019 upon evaluation today patient appears to be doing poorly with regard to his left lower extremity. We did get the results of his culture back it shows that he is still showing evidence of Pseudomonas which is consistent with the purulent/blue-green drainage that he has currently. Subsequently the culture also shows that he now is showing resistance to the oral fluoroquinolones which is unfortunate as that was really the only thing to treat the infection prior. I do believe that he is looking like this is going require  IV antibiotic therapy to get this under control. Fortunately there is no signs of systemic infection at this time which is good news. The patient does see Dr. Megan Salon tomorrow. 08/09/2019 upon evaluation today patient appears to be doing better with regard to his left lower extremity ulcer in regard to the overall appearance. He is currently on IV antibiotic therapy. As ordered by Dr. Megan Salon. Currently the patient is on ceftazidime which she is going to take for the next 2 weeks and then follow-up for 4 to 5-week appointment with Dr. Megan Salon. The patient started this this past Friday symptoms have not for a total of 3 days currently in full. 08/16/2019 upon evaluation today patient's wound actually does show muscle in the base of the wound but in general does appear to be much better as far as the overall evidence of infection is concerned. In fact I feel like this is for the most part cleared up he still on the IV antibiotics he has not completed the full course yet but I think he is doing much better which is excellent news. 08/23/2019 upon evaluation today patient appears to be doing about the same with regard to his wound at this point. He tells me that he still has pain unfortunately. Fortunately there is no evidence of systemic infection at this time which is great news. There is significant muscle protrusion. 09/13/19 upon evaluation today patient  appears to be doing about the same in regard to his leg unfortunately. He still has a lot of drainage coming from the ulceration there is still muscle exposed. With that being said the patient's last wound culture still showed an intermediate finding with regard to the Pseudomonas he still having the bluish/green drainage as well. Overall I do not know that the wound has completely cleared of infection at this point. Fortunately there is no signs of active infection systemically at this point which is good news. 09/20/2019 upon evaluation today patient's  wound actually appears to be doing about the same based on what I am seeing currently. I do not see any signs of systemic infection he still does have evidence of some local infection and drainage. He did see Dr. Megan Salon last week and Dr. Megan Salon states that he probably does need a different IV antibiotic although he does not want to put him on this until the patient begins the Remicade infusion which is actually scheduled for about 10 days out from today on 13 May. Following that time Dr. Megan Salon is good to see him back and then will evaluate the feasibility of starting him on the IV antibiotic therapy once again at that point. I do not disagree with this plan I do believe as Dr. Megan Salon stated in his note that I reviewed today that the patient's issue is multifactorial with the pyoderma being 1 aspect of this that were hoping the Remicade will be helpful for her. In the meantime I think the gentamicin is, helping to keep things under decent okay control in regard to the ulcer. 09/27/2019 upon evaluation today patient appears to be doing about the same with regard to his wound still there is a lot of muscle exposure though he does have some hyper granulation tissue noted around the edge and actually some granulation tissue starting to form over the muscle which is actually good news. Fortunately there is no evidence of active infection which is also good news. His pain is less at this point. 5/21; this is a patient I have not seen in a long time. 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 He has a large wound on the left lateral leg with protruding muscle. He comes in the clinic today showing the same area on his left medial ankle. He says there is been a spot there for some time although we have not previously defined this. Today he has a clearly defined area with slight amount of skin breakdown surrounded by raised areas with a purplish hue in color. This is  not painful he says it is irritated. 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 He still has muscle protruding from the lateral portion of his left leg but fortunately the new area that they were concerned about at his last visit does not appear to have opened at this point. He is currently on Remicade infusions and seems to be doing better in my opinion in fact the wound itself seems to be overall much better. The purplish discoloration that he did have seems to have resolved and I think that is a good sign that hopefully the Remicade is doing its job. He does Buffa, Millard ([id number]) have some biofilm noted over the surface of the wound. 11/01/2019 on evaluation today patient's wound actually appears to be doing excellent at this time. Fortunately there is no evidence of  active infection and overall I feel like he is making great progress. The Remicade seems to be due excellent job in my opinion. 11/08/19 evaluation today vision actually appears to be doing quite well with regard to his weight ulcer. He's been tolerating dressing changes without complication. Fortunately there is no evidence of infection. No fevers, chills, nausea, or vomiting noted at this time. Overall states that is having more itching than pain which is actually a good sign in my opinion. 12/13/2019 upon evaluation today patient appears to be doing well today with regard to his wound. He has been tolerating the dressing changes without complication. Fortunately there is no sign of active infection at this time. No fevers, chills, nausea, vomiting, or diarrhea. Overall I feel like the infusion therapy has been very beneficial for him. 01/06/2020 on evaluation today patient appears to be doing well with regard to his wound. This is measuring smaller and actually looks to be doing better. Fortunately there is no signs of  active infection at this point. No fevers, chills, nausea, vomiting, or diarrhea. With that being said he does still have the blue-green drainage but this does not seem to be causing any significant issues currently. He has been using the gentamicin that does seem to be keeping things under decent control at this point. He goes later this morning for his next infusion therapy for the pyoderma which seems to also be very beneficial. 02/07/2020 on evaluation today patient appears to be doing about the same in regard to his wounds currently. Fortunately there is no signs of active infection systemically he does still have evidence of local infection still using gentamicin. He also is showing some signs of improvement albeit slowly I do feel like we are making some progress here. 02/21/2020 upon evaluation today patient appears to be making some signs of improvement the wound is measuring a little bit smaller which is great news and overall I am very pleased with where he stands currently. He is going to be having infusion therapy treatment on the 15th of this month. Fortunately there is no signs of active infection at this time. 03/13/2020 I do believe patient's wound is actually showing some signs of improvement here which is great news. He has continue with the infusion therapy through rheumatology/dermatology at [facility]. That does seem to be beneficial. I still think he gets as much benefit from this as he did from the prednisone initially but nonetheless obviously this is less harsh on his body that the prednisone as far as they are concerned. 03/31/2020 on evaluation today patient's wound actually showing signs of some pretty good improvement in regard to the overall appearance of the wound bed. There is still muscle exposed though he does have some epithelial growth around the edges of the wound. Fortunately there is no signs of active infection at this time. No fevers, chills, nausea, vomiting, or  diarrhea. 04/24/2020 upon evaluation today patient appears to be doing about the same in regard to his leg ulcer. He has been tolerating the dressing changes without complication. Fortunately there is no signs of active infection at this time. No fevers, chills, nausea, vomiting, or diarrhea. With that being said he still has a lot of irritation from the bandaging around the edges of the wound. We did discuss today the possibility of a referral to plastic surgery. 05/22/2020 on evaluation today patient appears to be doing well with regard to his wounds all things considered. He has not been able to get the  Chantix apparently there is a recall nurse that I was unaware of put out by [employer] involuntarily. Nonetheless for now I am and I have to do some research into what may be the best option for him to help with quitting in regard to smoking and we discussed that today. 06/26/2020 upon evaluation today patient appears to be doing well with regard to his wound from the standpoint of infection I do not see any signs of infection at this point. With that being said unfortunately he is still continuing to have issues with muscle exposure and again he is not having a whole lot of new skin growth unfortunately. There does not appear to be any signs of active infection at this time. No fevers, chills, nausea, vomiting, or diarrhea. 07/10/2020 upon evaluation today patient appears to be doing a little bit more poorly currently compared to where he was previous. I am concerned currently about an active infection that may be getting worse especially in light of the increased size and tenderness of the wound bed. No fevers, chills, nausea, vomiting, or diarrhea. 07/24/2020 upon evaluation today patient appears to be doing poorly in regard to his leg ulcer. He has been tolerating the dressing changes without complication but unfortunately is having a lot of discomfort. 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 Subsequently I think he is going require possibly IV antibiotics to get this under control. I am very concerned about the severity of his infection and the amount of discomfort he is having. 07/31/2020 upon evaluation today patient appears to be doing about the same in regard to his leg wound. He did see Dr. Megan Salon and Dr. Megan Salon is actually going to start him on IV antibiotics. He goes for the PICC line tomorrow. With that being said there do not have that run for 2 weeks and then see how things are doing and depending on how he is progressing they may extend that a little longer. Nonetheless I am glad this is getting ready to be in place and definitely feel it may help the patient. In the meantime is been using mainly triamcinolone to the wound bed has an anti-inflammatory. 08/07/2020 on evaluation today patient appears to be doing well with regard to his wound compared even last week. In the interim he has gotten the PICC line placed and overall this seems to be doing excellent. There does not appear to be any evidence of infection which is great news systemically although locally of course has had the infection this appears to be improving with the use of the antibiotics. 08/14/2020 upon evaluation today patient's wound actually showing signs of excellent improvement. Overall the irritation has significantly improved the drainage is back down to more of a normal level and his pain is really pretty much nonexistent compared to what it was. Obviously I think that this is significantly improved secondary to the IV antibiotic therapy which has made all the difference in the world. Again he had a resistant form of Pseudomonas for which oral antibiotics just was not cutting it. Nonetheless I do think that still we need to consider the possibility of a surgical closure for this wound is been open so long and to be honest with muscle  exposed I think this can be very hard to get this to close outside of this although definitely were still working to try to do what we can in that regard. 08/21/2020 upon evaluation today patient appears  to be doing very well with regard to his wounds on the left lateral lower extremity/calf area. Fortunately there does not appear to be signs of active infection which is great news and overall very pleased with where things stand today. He is actually wrapping up his treatment with IV antibiotics tomorrow. After that we will see where things go from there. 08/28/2020 upon evaluation today patient appears to be doing decently well with regard to his leg ulcer. There does not appear to be any signs of Woodfield, Fredderick ([id number]) active infection which is great news and overall very pleased with where things stand today. No fevers, chills, nausea, vomiting, or diarrhea. 09/18/2020 upon evaluation today patient appears to be doing well with regard to his infection which I feel like is better. Unfortunately he is not doing as well with regard to the overall size of the wound which is not nearly as good at this point. I feel like that he may be having an issue here with the pyoderma being somewhat out of control. I think that he may benefit from potentially going back and talking to the dermatologist about what to do from the pyoderma standpoint. I am not certain if the infusions are helping nearly as much is what the prednisone did in the past. 10/02/2020 upon evaluation today patient appears to be doing well with regard to his leg ulcer. He did go to the [facility] nurse. Unfortunately they feel like there is a 10% chance that most that he would be able to heal and that the skin graft would take. Obviously this has led him to not be able to go down that path as far as treatment is concerned. Nonetheless he does seem to be doing a little bit better with the prednisone that I gave him last time. I think that he may  need to discuss with dermatology the possibility of long-term prednisone as that seems to be what is most helpful for him to be perfectly honest. I am not sure the Remicade is really doing the job. 10/17/2020 upon evaluation today patient appears to be doing a little better in regard to his wound. In fact the case has been since we did the prednisone on May 2 for him that we have noticed a little bit of improvement each time we have seen a size wise as well as appearance wise as well as pain wise. I think the prednisone has had a greater effect then the infusion therapy has to be perfectly honest. With that being said the patient also feels significantly better compared to what he was previous. All of this is good news but nonetheless I am still concerned about the fact that again we are really not set up to long-term manage him as far as prednisone is concerned. Obviously there are things that you need to be watched I completely understand the risk of prednisone usage as well. 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 With all that being said I do believe that we can give him another round of the prednisone which she is requesting today because of the improvement that he seen since we did that first round. 10/30/2020 upon evaluation today patient's wound actually is showing signs of doing quite well. There does not appear to be any evidence of infection which is great news and overall very pleased with where things stand today. No fevers, chills, nausea, vomiting, or diarrhea. He tells me that the  prednisone still has seem to have helped he wonders if we can extend that for just a little bit longer. He did not have the appointment with a dermatologist although he did have an infusion appointment last Friday. That was at [facility]. With that being said he tells me he could not do both that as well as the appointment with the physician on the same day therefore  that is can have to be rescheduled. I really want to see if there is anything they feel like that could be done differently to try to help this out as I am not really certain that the infusions are helping significantly here. 11/13/2020 upon evaluation today patient unfortunately appears to be doing somewhat poorly in regard to his wound I feel like this is actually worsening from the standpoint of the pyoderma spreading. I still feel like that he may need something different as far as trying to manage this going forward. Again we did the prednisone unfortunately his blood sugars are not doing so well because of this. Nonetheless I believe that the patient likely needs to try topical steroid. We have done triamcinolone for a while I think going with something stronger such as clobetasol could be beneficial again this is not something I do lightly I discussed this with the patient that again this does not normally put underneath an occlusive dressing. Nonetheless I think a thin film as such could help with some of the stronger anti-inflammatory effects. We discussed this today. He would like to try to give this a trial for the next couple weeks. I definitely think that is something that we can do. Evaluate7/03/2021 and today patient's wound bed actually showed signs of doing really about the same. There was a little expansion of the size of the wound and that leading edge that we done looking out although the clobetasol does seem to have slowed this down a bit in my opinion. There is just 1 small area that still seems to be progressing based on what I see. Nonetheless I am concerned about the fact this does not seem to be improving if anything seems to be doing a little bit worse. I do not know that the infusions are really helping him much as next infusion is August 5 his appointment with dermatology is July 25. Either way I really think that we need to have a conversation potentially about this and  I am actually going to see if I can talk with Dr. Lillia Carmel in order to see where things stand as well. 12/11/2020 upon evaluation today patient appears to be doing worse in regard to his leg ulcer. Unfortunately I just do not think this is making the progress that I would like to see at this point. Honestly he does have an appointment with dermatology and this is in 2 days. I am wondering what they may have to offer to help with this. Right now what I am seeing is that he is continuing to show signs of worsening little by little. Obviously that is not great at all. Is the exact opposite of what we are looking for. 12/18/2020 upon evaluation today patient appears to be doing a little better in regard to his wound. The dermatologist actually did do some steroid injections into the wound which does seem to have been beneficial in my opinion. That was on the 25th already this looks a little better to me than last time I saw him. With that being said we did do a  culture and this did show that he has Staph aureus noted in abundance in the wound. With that being said I do think that getting him on an oral antibiotic would be appropriate as well. Also think we can compression wrap and this will make a difference as well. 12/28/2020 upon evaluation today patient's wound is actually showing signs of doing much better. I do believe the compression wrap is helping he has a lot of drainage but to be honest I think that the compression is helping to some degree in this regard as well as not draining through which is also good news. No fevers, chills, nausea, vomiting, or diarrhea. 01/04/2021 upon evaluation today patient appears to be doing well with regard to his wound. Overall things seem to be doing quite well. He did have a little bit of reaction to the CarboFlex Sorbact he will be using that any longer. With that being said he is controlled as far as the drainage is concerned overall and seems to be doing quite  well. I do not see any signs of active infection at this time which is great news. No fevers, chills, nausea, vomiting, or diarrhea. 01/11/2021 upon evaluation today patient appears to be doing well with regard to his wounds. He has been tolerating the dressing changes without complication. Fortunately there does not appear to be any signs of active infection at this time which is great news. Overall I am extremely pleased with where we stand currently. No fevers, chills, nausea, vomiting, or diarrhea. Where using clobetasol in the wound bed he has a lot of new skin growth which is awesome as well. 01/18/2021 upon evaluation today patient appears to be doing very well in regard to his leg ulcer. He has been tolerating the dressing changes without complication. Fortunately there does not appear to be any signs of active infection which is great news. In general I think that he is making excellent progress 01/25/2021 upon evaluation today patient appears to be doing well with regard to his wound on the leg. I am actually extremely pleased with where things stand today. There does not appear to be any signs of active infection which is great news and overall I think that we are definitely headed in the appropriate direction based on what I am seeing currently. There does not appear to be any signs of active infection also excellent news. 02/06/2021 upon evaluation today patient appears to be doing well with regard to his wound. Overall visually this is showing signs of significant Gura, Adis ([id number]) improvement which is great news. I do not see any signs of active infection systemically which is great even locally I do not think that we are seeing any major complications here. We did do fluorescence imaging with the [employer] today. The patient does have some odor and drainage noted and again this is something that I think would benefit him to probably come more frequently for nurse  visits. 02/19/2021 upon evaluation today patient actually appears to be doing quite well in regard to his wound. He has been tolerating the dressing changes without complication and overall I think that this is making excellent progress. I do not see any evidence of active infection at this point which is great news as well. No fevers, chills, nausea, vomiting, or diarrhea. 10/10; wound is made nice progress healthy granulation with a nice rim of epithelialization which seems to be expanding even from last week he has a deeper area in the inferior part of  the more distal part of the wound with not quite as healthy as surface. This area will need to be followed. Using clobetasol and Hydrofera Blue 03/05/2021 upon evaluation today patient appears to be doing very well in regard to his leg ulcer. He has been tolerating dressing changes without complication. Fortunately there does not appear to be any signs of active infection which is great news and overall I am extremely pleased with where we stand currently. 03/12/2021 upon evaluation today patient appears to be doing well with regard to his wound in fact this is extremely extremely good based on what we are seeing today there does not appear to be any signs of active infection and overall I think that he is doing awesome from the standpoint of healing in general. I am extremely pleased with how things seem to be progressing with regard to this pyoderma. Clobetasol has done wonders for him. I think the compression wrapping has also been of great benefit. 03/19/2021 upon evaluation today patient appears to be doing well with regard to his wound. He is tolerating the dressing changes without complication. In fact I feel like that he is actually making excellent progress at this point based on what I am seeing. No fevers, chills, nausea, vomiting, or diarrhea. 03/26/2021 upon evaluation today patient appears to be doing well with regard to his wound. This  again is measuring smaller and looking better. Again the progress is slow but nonetheless continual with what we have been seeing. I do believe that the current plan is doing awesome for him. 04/09/2021 upon evaluation today patient appears to be doing well with regard to his leg ulcer. This is showing signs of excellent improvement the muscle is completely closed over and there does not appear to be any evidence of inflammation at this point his drainage is significantly improved. Overall I think that he would be a good candidate for looking into a skin substitute at this point as well. We will get a look into some approvals in that regard. Potentially TheraSkin as well as Apligraf could both be considered just depending on insurance coverage. 04/16/2021 upon evaluation today patient appears to be doing well at this point. He has been approved for the Apligraf which we could definitely order although I would like to try to get the TheraSkin approved if at all possible. I did fax notes into them today and I Georgina Peer try to give a call as well. Overall the wound appears to be doing decently well today. 04/20/2021 upon evaluation today patient actually appears to be doing quite well in regard to his wound with that being said we are trying to see what we can do about speeding up the healing process. For that reason we did discuss the possibility of a skin substitute. We got the Apligraf approved. For that reason we will get a go ahead and see what we can do with the Apligraf at this point. I am still trying to get the TheraSkin approved but I have not heard anything from the insurance company yet I have called and talked to them earlier in the week and to be honest this was about half an hour that I spent on the phone they told me I would hear something within 10-15 business days. 04/27/2021 upon evaluation today patient appears to be doing excellent in regard to his wound. I do believe the Apligraf has been  beneficial. With that being said he has had a little bit of increased pain has  been a little bit concerned. I do want to go ahead and apply his steroid cream today the clobetasol and then put the Apligraf over I just do not think I want to risk not putting the clobetasol on based on what is been going on here currently. Patient voiced understanding. 05/04/2021 upon evaluation today patient is making excellent improvement. Overall there is definitely decrease in the size of the wound today and I am very pleased in that regard. I do not see any signs of active infection locally nor systemically at this point. No fevers, chills, nausea, vomiting, or diarrhea. 05/11/2021 upon evaluation today patient appears to be doing well with regard to his wound. He has been tolerating Apligraf's without complication and is making excellent progress this is application #4 today. 12/30; patient here for Apligraf application. Appears to be doing well small wound there has been a lot of healing 05/28/2021 upon evaluation today patient appears to be doing well with regard to his wound. Has been tolerating the dressing changes without complication. Fortunately I do not see any signs of active infection locally nor systemically at this point which is great news. He is done with the Apligraf and the first round and overall this has filled in quite nicely I am not even certain he needs any additional at the moment. I would actually try to go back to clobetasol with [name] which previously was doing well for him. 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 He is using clobetasol and Hydrofera Blue. Surface area of the wound is smaller and the surface looks very healthy. 06/11/2021 upon evaluation today patient actually appears to be showing signs of good improvement which is great news. Fortunately I do not see any signs of active infection at this time which is also  excellent news. I do believe that the patient is doing well with the clobetasol and Hydrofera Blue. He does have some irritation and itching around the rest of the leg will use some triamcinolone over this area. 06/18/2021 upon evaluation today patient appears to be doing well with regard to his wound. He is showing signs of excellent improvement and overall I am extremely pleased with where we stand today. We are moving in the right direction. 06/25/2021 upon evaluation today patient appears to be doing well with regard to his wound. In fact this is doing also not showing signs of excellent improvement overall very pleased with where we stand today. I do not see any evidence of active infection locally or systemically at this time which is also great news. 07/02/2021 upon evaluation patient's wound bed actually showed signs of good granulation and epithelization at this point. And this is measuring significantly smaller and overall I am extremely pleased with where we stand today. No fevers, chills, nausea, vomiting, or diarrhea. 07/09/2021 upon evaluation patient's wound bed actually showed signs of good granulation and epithelization at this point. Fortunately there does not appear to be any evidence of active infection locally or systemically which is great news and overall I am extremely pleased with where we GABRYEL, FILES ([id number]) stand at this point. 07/16/2021 upon evaluation today patient appears to be doing well with regard to his wound all things considered although it is maybe a little bit larger than what its been. This does have me a little concerned. Actually question about whether anything is changed and initially he told me know. In fact it was not until the end of the  visit that he actually did mention that he had missed his infusion in January. This very well may be what is because the difference is also seeing currently. That definitely has seem to been helping more  recently. 07/23/2021 upon evaluation today patient's wound is yet again larger despite the switch to a collagen which was not beneficial. Subsequently I am going to at this point check on a couple things. First and foremost I do want to go ahead and do a culture. I think that there is a chance he could have a low-lying infection that may be part of the issue here. Subsequently I am also probably can go ahead and place him on Bactrim DS which I think is a good option and has done well with them in the past. Again if this is an infection that should hopefully help to turn things around. With all that being said I do believe that the patient should hopefully be able to turn this around with reinitiation of the infusion therapy which will be going on this week and subsequently getting on the antibiotic. 3/13; patient presents for follow-up. He has no issues or complaints today. He states he is currently on oral antibiotics for previous culture done in the office. 08/06/2021 upon evaluation today patient appears to be doing well with regard to his wound. I am actually seeing signs of improvement which is great news. I think that he is on a better track he did receive his infusion as well which is great news and overall I think that this should hopefully get him back on track as far as healing is concerned. He is not having any pain and I do not see any signs of inflammation which is great news. 08/13/2021 upon evaluation today patient appears to be doing excellent in regard to his wound on the leg. He has been tolerating the dressing changes without complication. Fortunately I do not see any signs of infection I do believe he is making good progress here and I think her back on track overall the tissue is greatly improved compared to what we have been. 08/20/2021 upon evaluation today patient appears to be doing excellent in regard to his wound. He has been tolerating the dressing changes without complication.  Fortunately I do not see any evidence of infection at this time locally or systemically which is great news and overall very pleased with where we stand. 08-31-2021 upon evaluation today patient appears to be doing excellent in regard to his wound. Overall even with the vacation and extra walking he seems to have really done well. Fortunately I do not see any evidence of active infection locally or systemically which is great news and overall I think we are headed in the right direction. 09-10-2021 upon evaluation today patient's wound is showing signs of improvement. This is a small improvement but nonetheless we seem to be making progress here. I am very pleased in that regard. There does not appear to be any signs of infection and overall things are doing quite nicely. Upon inspection patient's wound bed actually showed signs of good granulation and epithelization at this point. There was some irritation around the edges of the wound where the good skin was it almost appears that something was rubbing or else is was due from drainage and irritation. Nonetheless I am going to see about putting a little bit of zinc just around the wound opening in order to protect the skin from being damaged. The patient is in agreement  with that plan. 5/8; its been a while since I have seen this wound and it looks considerably better although still with some depth. Per our intake nurse the dimensions have improved patient has been using a thin layer of clobetasol, Hydrofera Blue under 4-layer compression. He appears to be making progress 10-01-2021 upon evaluation today patient appears to be doing well with regard to his wound although it is somewhat stalled I feel like were basically at a standstill here. He has previously had Apligraf which did very well for him to be honest. I do believe that he may benefit from a repeat of the Apligraf. In fact it got so small at that point but I do not think we even used all of  the applications that were recommended as this had improved to such a degree. Nonetheless I do believe that the patient currently could benefit from repeat treatment with the Apligraf due to the improvement this that we did see. He is not opposed to this and subsequently working to look into getting approval to reinstitute that treatment. 10-08-2021 upon evaluation today patient appears to be doing well with regard to his wound. Overall I feel like he is doing a little bit better from the standpoint of irritation at this time which is great news. I do not see any signs of active infection locally or systemically which is great news as well. No fevers, chills, nausea, vomiting, or diarrhea. 10-12-2021 upon evaluation today patient appears to be doing well with regard to his wound stable although we are can initiate treatment with Apligraf today. I am hoping this will stimulate things to improve. Patient is in agreement with that plan. 10-19-2028 upon evaluation today patient appears to be doing well with regard to his wound. He has been tolerating the dressing changes without complication. Fortunately there does not appear to be any evidence of active infection locally or systemically at this time which is great news. No fevers, chills, nausea, vomiting, or diarrhea. I do believe the Apligraf has improved the overall appearance of the wound bed receiving some definite improvements here. 10-26-2021 upon evaluation today patient's wound is actually shown some signs of really good improvement I am very pleased in this regard. There is a little bit of slough and biofilm buildup that is stuck to the surface of the wound I am getting very lightly debrided in order to clear this away and then subsequently will get a continue with the Apligraf application today. This is #3. 11-09-2021 upon evaluation today patient appears to be doing well currently in regard to his wound. This is actually showing signs of  significant improvement which is great news and overall I think the Apligraf is doing an amazing job. This wound is significantly smaller compared to the last time I saw him. This will be Apligraf application #4. [id number] upon evaluation today patient appears to be doing well currently in regard to his wound he is actually showing signs of excellent improvement which is great news and overall I am extremely pleased with where we stand today. I do not see any evidence of active infection locally or systemically at this time which is great news. No fevers, chills, nausea, vomiting, or diarrhea. I believe the Apligraf is doing an awesome job and in fact I [place] see about getting an extension of application which hopefully should not be a problem. Especially in light of how much improvement he has had with regards to the size of the wound with treatment.  This is application #5 today. BRECKIN, SAVANNAH ([id number]) 12-07-2021 upon evaluation today patient appears to be doing excellent in regard to his wound has been tolerating the dressing changes without complication. Fortunately there does not appear to be any signs of active infection locally or systemically at this time. 12-21-2021 upon evaluation today patient appears to be doing well currently in regard to his wound. We have gotten approval for a repeat Apligraf treatment. I think that is going to be a good way to wrap this up I really feel like we will likely be able to get the wound closed with the additional treatments that have been improved. 01-04-2022 upon evaluation patient appears to be doing excellent in regard to his wound. He is tolerating the dressing changes without complication and is measuring much better today as well. I am very pleased with where we stand. I am not even certain we will get have to use all 5 of the reapproved Apligraf but we will see how things go right now I am extremely pleased with the progress that is been made. 01-18-2022  upon evaluation today patient appears to be doing well with regard to his wound is actually measuring significantly better which is great news. This is still open but very minimally. I do think that it is appropriate to continue with the Apligraf he has done so well with this. 02-01-2022 upon evaluation today patient actually appears to be doing well currently with regard to his wound in fact this appears to be pretty much completely healed. There is just a very pinpoint area that is leaking a little clear fluid otherwise this is doing quite well. I do not see any evidence of active infection at this time. 02-08-2022 upon evaluation today patient appears to be doing well currently in regard to his wound in fact this is showing signs of complete resolution as of today. Objective Constitutional Well-nourished and well-hydrated in no acute distress. Vitals Time Taken: 8:26 AM, Height: 71 in, Weight: 338 lbs, BMI: 47.1, Temperature: 98.3 F, Pulse: 76 bpm, Respiratory Rate: 16 breaths/min, Blood Pressure: 123/84 mmHg. Respiratory normal breathing without difficulty. Psychiatric this patient is able to make decisions and demonstrates good insight into disease process. Alert and Oriented x 3. pleasant and cooperative. General Notes: Upon inspection patient's wound bed actually showed signs of good granulation and epithelization at this point. Fortunately I see no evidence of active infection locally or systemically which is great news and overall I am extremely pleased with where we stand at this point. I am extremely pleased with the fact that the patient appears to be completely healed today. This has been an extremely long time coming. Integumentary (Hair, Skin) Wound #1 status is Healed - Epithelialized. Original cause of wound was Gradually Appeared. The date acquired was: 11/18/2015. The wound has been in treatment 270 weeks. The wound is located on the Left,Lateral Lower Leg. The wound measures  0cm length x 0cm width x 0cm depth; 0cm^2 area and 0cm^3 volume. There is Fat Layer (Subcutaneous Tissue) exposed. There is a medium amount of serosanguineous drainage noted. There is large (67-100%) red granulation within the wound bed. There is a small (1-33%) amount of necrotic tissue within the wound bed. 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 Post procedure Diagnosis Wound #: Same as Pre-Procedure Plan Follow-up Appointments: Return Appointment in 1 week. Bathing/ Shower/ Hygiene: Clean wound with Normal Saline or wound cleanser. May shower with wound dressing protected with water repellent cover or cast protector. No tub bath. Edema Control - Lymphedema / Segmental Compressive Device / Other: Optional: One layer of unna paste to top of compression wrap (to act as an anchor). - He needs this to hold it up 4 Layer Compression System Lymphedema. Elevate, Exercise Daily and Avoid Standing for Long Periods of Time. Elevate legs to the level of the heart and pump ankles as often as possible Elevate leg(s) parallel to the floor when sitting. Medications-Please add to medication list.: Other: - clobetasol to area, then Mepitel, steristrips, bolus 1. I am good recommend that we go ahead and continue with the recommendation for wound care measures as before and the patient is in agreement with plan. This includes the use of the 4-layer compression wrap or using some of the clobetasol on the surface of the skin and then still covering with the Mepitel to prevent any irritation. 2. I am also can recommend that we  have the patient continue to monitor for any signs of worsening or infection. Office if anything changes he should let me know such as increased pain but for now we will get a keep him wrapped for at least an additional 2 weeks to make sure that everything maintains before we completely discharge him. We will see patient back for reevaluation in 1 week here in the clinic. If anything worsens or changes patient will contact our office for additional recommendations. Electronic Signature(s) Signed: 02/08/2022 9:25:23 AM By: Worthy Keeler PA-C Entered By: Worthy Keeler on 02/08/2022 09:25:23 GAETANO, ROMBERGER ([id number]) -------------------------------------------------------------------------------- SuperBill Details Patient Name: [name] Date of Service: 02/08/2022 Medical Record Number: [id number] Patient Account Number: [id number] Date of Birth/Sex: [birth year] (41 y.o. M) Treating RN: [name] Primary Care Provider: [name] Other Clinician: Massie Kluver Referring Provider: [name] Treating Provider/Extender: [name] Cliche in Treatment: 270 Diagnosis Coding ICD-10 Codes Code Description I87.2 Venous insufficiency (chronic) (peripheral) L97.222 Non-pressure chronic ulcer of left calf with fat layer exposed E11.622 Type 2 diabetes mellitus with other skin ulcer L88 Pyoderma gangrenosum F17.208 Nicotine dependence, unspecified, with other nicotine-induced disorders Facility Procedures CPT4 Code: 91694503 Description: (Facility Use Only) [phone] - [facility] LWR LT LEG Modifier: Quantity: 1 Physician Procedures CPT4 Code: 3491791 Description: 50569 - WC PHYS LEVEL 3 - EST PT Modifier: Quantity: 1 CPT4 Code: Description: ICD-10 Diagnosis Description I87.2 Venous insufficiency (chronic) (peripheral) L97.222 Non-pressure chronic ulcer of left calf with fat layer exposed E11.622 Type 2 diabetes mellitus with other skin ulcer L88 Pyoderma  gangrenosum Modifier: Quantity: Electronic Signature(s) Signed: 02/08/2022 9:26:05 AM By: Worthy Keeler PA-C Entered By: Worthy Keeler on 02/08/2022 09:26:05

## 2022-02-11 ENCOUNTER — Ambulatory Visit: Payer: 59

## 2022-02-12 DIAGNOSIS — E119 Type 2 diabetes mellitus without complications: Secondary | ICD-10-CM

## 2022-02-12 NOTE — Unmapped (Signed)
[facility] Update note for your review

## 2022-02-13 ENCOUNTER — Ambulatory Visit: Payer: 59

## 2022-02-13 NOTE — Unmapped (Signed)
Lab report ?

## 2022-02-13 NOTE — Unmapped (Signed)
Please disregard

## 2022-02-14 MED ORDER — RELION PEN NEEDLES 31G X 6 MM MISC: 0 refills | Status: DC

## 2022-02-14 NOTE — Unmapped (Signed)
Received labs from [facility]. Labs drawn on 02/12/22 show normal CBC, BUN/Cr, but elevated AST at 47 and ALT at 75.  These records were scanned to the medical record.

## 2022-02-14 NOTE — Unmapped (Signed)
Called, left voicemail to schedule an appointment     Thanks,  Courtney

## 2022-02-15 ENCOUNTER — Encounter: Payer: 59 | Admitting: Physician Assistant

## 2022-02-15 DIAGNOSIS — E11622 Type 2 diabetes mellitus with other skin ulcer: Secondary | ICD-10-CM | POA: Diagnosis not present

## 2022-02-15 NOTE — Progress Notes (Addendum)
[name], [name] ([id number]) Visit Report for 02/15/2022 Arrival Information Details Patient Name: [name], [name] Date of Service: 02/15/2022 8:15 AM Medical Record Number: [id number] Patient Account Number: [id number] Date of Birth/Sex: 1980/10/11 (41 y.o. M) Treating RN: Carlene Coria Primary Care Ata Pecha: [name] Other Clinician: Massie Kluver Referring Matthew Pais: [name] Treating Glendale Youngblood/Extender: [name] Cliche in Treatment: 11 Visit Information History Since Last Visit All ordered tests and consults were completed: No Patient Arrived: Ambulatory Added or deleted any medications: No Arrival Time: 08:02 Any new allergies or adverse reactions: No Transfer Assistance: None Had a fall or experienced change in No Patient Requires Transmission-Based No activities of daily living that may affect Precautions: risk of falls: Patient Has Alerts: Yes Hospitalized since last visit: No Patient Alerts: Patient has reaction Pain Present Now: No to silver dressings. Electronic Signature(s) Signed: 02/15/2022 12:10:36 PM By: Massie Kluver Entered By: Massie Kluver on 02/15/2022 08:03:07 [name] ([id number]) -------------------------------------------------------------------------------- Clinic Level of Care Assessment Details Patient Name: [name], [name] Date of Service: 02/15/2022 8:15 AM Medical Record Number: [id number] Patient Account Number: [id number] Date of Birth/Sex: 1980-10-27 (41 y.o. M) Treating RN: Carlene Coria Primary Care Lizzeth Meder: [name] Other Clinician: Massie Kluver Referring Clarabelle Oscarson: [name] Treating Jakob Kimberlin/Extender: [name] Cliche in Treatment: [facility] Level of Care Assessment Items TOOL 1 Quantity Score []  - Use when EandM and Procedure is performed on INITIAL visit 0 ASSESSMENTS - Nursing Assessment / Reassessment []  - General Physical Exam (combine w/ comprehensive assessment (listed just below) when performed  on new 0 pt. evals) []  - 0 Comprehensive Assessment (HX, ROS, Risk Assessments, Wounds Hx, etc.) ASSESSMENTS - Wound and Skin Assessment / Reassessment []  - Dermatologic / Skin Assessment (not related to wound area) 0 ASSESSMENTS - Ostomy and/or Continence Assessment and Care []  - Incontinence Assessment and Management 0 []  - 0 Ostomy Care Assessment and Management (repouching, etc.) PROCESS - Coordination of Care []  - Simple Patient / Family Education for ongoing care 0 []  - 0 Complex (extensive) Patient / Family Education for ongoing care []  - 0 Staff obtains Programmer, systems, Records, Test Results / Process Orders []  - 0 Staff telephones HHA, Nursing Homes / Clarify orders / etc []  - 0 Routine Transfer to another Facility (non-emergent condition) []  - 0 Routine Hospital Admission (non-emergent condition) []  - 0 New Admissions / Biomedical engineer / Ordering NPWT, Apligraf, etc. []  - 0 Emergency Hospital Admission (emergent condition) PROCESS - Special Needs []  - Pediatric / Minor Patient Management 0 []  - 0 Isolation Patient Management []  - 0 Hearing / Language / Visual special needs []  - 0 Assessment of Community assistance (transportation, D/C planning, etc.) []  - 0 Additional assistance / Altered mentation []  - 0 Support Surface(s) Assessment (bed, cushion, seat, etc.) INTERVENTIONS - Miscellaneous []  - External ear exam 0 []  - 0 Patient Transfer (multiple staff / Civil Service fast streamer / Similar devices) []  - 0 Simple Staple / Suture removal (25 or less) []  - 0 Complex Staple / Suture removal (26 or more) []  - 0 Hypo/Hyperglycemic Management (do not check if billed separately) []  - 0 Ankle / Brachial Index (ABI) - do not check if billed separately Has the patient been seen at the hospital within the last three years: Yes Total Score: 0 Level Of Care: ____ [name] ([id number]) Electronic Signature(s) Signed: 02/15/2022 12:10:36 PM By: Massie Kluver Entered By:  Massie Kluver on 02/15/2022 08:26:06 [name], [name] ([id number]) -------------------------------------------------------------------------------- Compression Therapy Details Patient Name: [name] Date of Service: 02/15/2022 8:15 AM Medical  Record Number: [id number] Patient Account Number: [id number] Date of Birth/Sex: [birth year] (41 y.o. M) Treating RN: Carlene Coria Primary Care Derryl Uher: [name] Other Clinician: Massie Kluver Referring Deana Krock: [name] Treating Lyrika Souders/Extender: [name] Cliche in Treatment: 271 Compression Therapy Performed for Wound Assessment: NonWound Condition Lymphedema - Left Leg Performed By: Lenice Pressman, Angie, Compression Type: Four Layer Pre Treatment ABI: 1.2 Post Procedure Diagnosis Same as Pre-procedure Electronic Signature(s) Signed: 02/15/2022 12:10:36 PM By: Massie Kluver Entered By: Massie Kluver on 02/15/2022 08:25:26 [name], [name] ([id number]) -------------------------------------------------------------------------------- Encounter Discharge Information Details Patient Name: [name] Date of Service: 02/15/2022 8:15 AM Medical Record Number: [id number] Patient Account Number: [id number] Date of Birth/Sex: 08-Jun-1980 (41 y.o. M) Treating RN: Carlene Coria Primary Care Taivon Haroon: [name] Other Clinician: Massie Kluver Referring Cross Jorge: [name] Treating Kermit Arnette/Extender: [name] Cliche in Treatment: 271 Encounter Discharge Information Items Discharge Condition: Stable Ambulatory Status: Ambulatory Discharge Destination: Home Transportation: Private Auto Accompanied By: self Schedule Follow-up Appointment: Yes Clinical Summary of Care: Electronic Signature(s) Signed: 02/15/2022 12:10:36 PM By: Massie Kluver Entered By: Massie Kluver on 02/15/2022 08:44:41 [name], [name] ([id number]) -------------------------------------------------------------------------------- Lower Extremity  Assessment Details Patient Name: [name] Date of Service: 02/15/2022 8:15 AM Medical Record Number: [id number] Patient Account Number: [id number] Date of Birth/Sex: 14-Jun-1980 (41 y.o. M) Treating RN: Carlene Coria Primary Care Randon Somera: [name] Other Clinician: Massie Kluver Referring Sayvion Vigen: [name] Treating Peggye Poon/Extender: [name] Cliche in Treatment: 271 Edema Assessment Assessed: [Left: Yes] [Right: No] Edema: [Left: N] [Right: o] Calf Left: Right: Point of Measurement: 35 cm From Medial Instep 46 cm Ankle Left: Right: Point of Measurement: 10 cm From Medial Instep 28 cm Vascular Assessment Pulses: Dorsalis Pedis Palpable: [Left:Yes] Posterior Tibial Palpable: [Left:Yes] Electronic Signature(s) Signed: 02/15/2022 10:43:45 AM By: Carlene Coria RN Signed: 02/15/2022 12:10:36 PM By: Massie Kluver Entered By: Massie Kluver on 02/15/2022 08:20:05 [name] ([id number]) -------------------------------------------------------------------------------- Multi Wound Chart Details Patient Name: [name] Date of Service: 02/15/2022 8:15 AM Medical Record Number: [id number] Patient Account Number: [id number] Date of Birth/Sex: [birth year] (41 y.o. M) Treating RN: Carlene Coria Primary Care Emori Mumme: [name] Other Clinician: Massie Kluver Referring Marthella Osorno: [name] Treating Agapito Hanway/Extender: [name] Cliche in Treatment: 271 Vital Signs Height(in): 71 Pulse(bpm): 89 Weight(lbs): 338 Blood Pressure(mmHg): 129/80 Body Mass Index(BMI): 47.1 Temperature(F): 98.3 Respiratory Rate(breaths/min): 16 Wound Assessments Treatment Notes Electronic Signature(s) Signed: 02/15/2022 12:10:36 PM By: Massie Kluver Entered By: Massie Kluver on 02/15/2022 08:20:22 [name] ([id number]) -------------------------------------------------------------------------------- Multi-Disciplinary Care Plan Details Patient Name: [name] Date of Service: 02/15/2022 8:15 AM Medical Record Number: [id number] Patient Account Number: [id number] Date of Birth/Sex: Mar 31, 1981 (41 y.o. M) Treating RN: Carlene Coria Primary Care Island Dohmen: [name] Other Clinician: Massie Kluver Referring Archimedes Harold: [name] Treating Arias Weinert/Extender: [name] Cliche in Treatment: Luther reviewed with physician Active Inactive Electronic Signature(s) Signed: 02/15/2022 10:43:45 AM By: Carlene Coria RN Signed: 02/15/2022 12:10:36 PM By: Massie Kluver Entered By: Massie Kluver on 02/15/2022 08:20:11 DEMERIUS, PODOLAK ([id number]) -------------------------------------------------------------------------------- Pain Assessment Details Patient Name: [name] Date of Service: 02/15/2022 8:15 AM Medical Record Number: [id number] Patient Account Number: [id number] Date of Birth/Sex: Sep 13, 1980 (41 y.o. M) Treating RN: Carlene Coria Primary Care Kwasi Joung: [name] Other Clinician: Massie Kluver Referring Tempestt Silba: [name] Treating Tamlyn Sides/Extender: [name] Cliche in Treatment: 271 Active Problems Location of Pain Severity and Description of Pain Patient Has Paino No Site Locations Pain Management and Medication Current Pain Management: Electronic Signature(s) Signed: 02/15/2022 10:43:45 AM By: Carlene Coria RN Signed:  02/15/2022 12:10:36 PM By: Massie Kluver Entered By: Massie Kluver on 02/15/2022 08:13:07 [name] ([id number]) -------------------------------------------------------------------------------- Patient/Caregiver Education Details Patient Name: [name] Date of Service: 02/15/2022 8:15 AM Medical Record Number: [id number] Patient Account Number: [id number] Date of Birth/Gender: [birth year] (40 y.o. M) Treating RN: Carlene Coria Primary Care Physician: [name] Other Clinician: Massie Kluver Referring Physician: [name] Treating  Physician/Extender: [name] Cliche in Treatment: 41 Education Assessment Education Provided To: Patient Education Topics Provided Wound/Skin Impairment: Handouts: Other: continue wound care as directed Methods: Explain/Verbal Responses: State content correctly Electronic Signature(s) Signed: 02/15/2022 12:10:36 PM By: Massie Kluver Entered By: Massie Kluver on 02/15/2022 08:26:46 [name], [name] ([id number]) -------------------------------------------------------------------------------- [place] Details Patient Name: [name] Date of Service: 02/15/2022 8:15 AM Medical Record Number: [id number] Patient Account Number: [id number] Date of Birth/Sex: 07/14/1980 (41 y.o. M) Treating RN: Carlene Coria Primary Care Jhayla Podgorski: [name] Other Clinician: Massie Kluver Referring Adrean Heitz: [name] Treating Hisham Provence/Extender: [name] Cliche in Treatment: 271 Vital Signs Time Taken: 08:11 Temperature (F): 98.3 Height (in): 71 Pulse (bpm): 89 Weight (lbs): 338 Respiratory Rate (breaths/min): 16 Body Mass Index (BMI): 47.1 Blood Pressure (mmHg): 129/80 Reference Range: 80 - 120 mg / dl Electronic Signature(s) Signed: 02/15/2022 12:10:36 PM By: Massie Kluver Entered By: Massie Kluver on 02/15/2022 08:12:34

## 2022-02-15 NOTE — Progress Notes (Addendum)
SEBASTIAN, LURZ ([id number]) Visit Report for 02/15/2022 Chief Complaint Document Details Patient Name: SHMIEL, MORTON Date of Service: 02/15/2022 8:15 AM Medical Record Number: [id number] Patient Account Number: [id number] Date of Birth/Sex: 08-Feb-1981 (41 y.o. 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 M) Treating RN: Carlene Coria Primary Care Provider: [name] Other Clinician: Massie Kluver Referring Provider: [name] Treating Provider/Extender: [name] Cliche in Treatment: 31 History of Present Illness HPI Description: 12/04/16; 41 year old man who comes into the clinic today for review of a wound on the posterior left calf. He tells me that is been there for about a year. He is not a diabetic he does smoke half a pack per day. He was seen in the ER on 11/20/16 felt to have cellulitis around the wound and was given clindamycin. An x-ray did not show osteomyelitis. The patient initially tells me that he has a milk allergy that sets off a pruritic itching rash on his lower legs which she scratches incessantly and he thinks that's what may have set up the wound. He has been using various topical antibiotics and ointments without any effect. He works in a [employer] and is on his feet all day.  He does not have a prior history of wounds however he does have the rash on both lower legs the right arm and the ventral aspect of his left arm. These are excoriations and clearly have had scratching however there are of macular looking areas on both legs including a substantial larger area on the right leg. This does not have an underlying open area. There is no blistering. The patient tells me that 2 years ago in [place] in response to the rash on his legs he saw a dermatologist who told him he had a condition which may be pyoderma gangrenosum although I may be putting words into his mouth. He seemed to recognize this. On further questioning he admits to a 5 year history of quiesced. ulcerative colitis. He is not in any treatment for this. He's had no recent travel 12/11/16; the patient arrives today with his wound and roughly the same condition we've been using silver alginate this is a deep punched out wound with some surrounding erythema but no tenderness. Biopsy I did did not show confirmed pyoderma gangrenosum suggested nonspecific inflammation and vasculitis but does not provide an actual description of what was seen by the pathologist. I'm really not able to understand this We have also received information from the patient's dermatologist in [place] notes from April 2016. This was a doctor Agarwal-antal. 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 He was prescribed topical steroid high potency under occlusion which helped but at this point the patient did not have a deep punched out wound. 12/18/16; the patient's wound is larger in terms of surface area however this surface looks better and there is less depth. The surrounding erythema also is better. The patient states that the wrap we put on came off 2 days ago when he has been  using his compression stockings. He we are in the process of getting a dermatology consult. 12/26/16 on evaluation today patient's left lower extremity wound  shows evidence of infection with surrounding erythema noted. He has been tolerating the dressing changes but states that he has noted more discomfort. There is a larger area of erythema surrounding the wound. No fevers, chills, nausea, or vomiting noted at this time. With that being said the wound still does have slough covering the surface. He is not allergic to any medication that he is aware of at this point. In regard to his right lower extremity he had several regions that are erythematous and pruritic he wonders if there's anything we can do to help that. 01/02/17 I reviewed patient's wound culture which was obtained his visit last week. He was placed on doxycycline at that point. Unfortunately that does not appear to be an antibiotic that would likely help with the situation however the pseudomonas noted on culture is sensitive to Cipro. Also unfortunately patient's wound seems to have a large compared to last week's evaluation. Not severely so but there are definitely increased measurements in general. He is continuing to have discomfort as well he writes this to be a seven out of 10. In fact he would prefer me not to perform any debridement today due to the fact that he is having discomfort and considering he has an active infection on the little reluctant to do so anyway. No fevers, chills, nausea, or vomiting noted at this time. 01/08/17; patient seems dermatology on September 5. I suspect dermatology will want the slides from the biopsy I did sent to their pathologist. I'm not sure if there is a way we can expedite that. In any case the culture I did before I left on vacation 3 weeks ago showed Pseudomonas he was given 10 days of Cipro and per her description of her intake nurses is actually somewhat better this week although the wound is quite a bit bigger than I remember the last time I saw this. He still has 3 more days of Cipro 01/21/17; dermatology appointment tomorrow. He has completed  the ciprofloxacin for Pseudomonas. Surface of the wound looks better however he is had some deterioration in the lesions on his right leg. Meantime the left lateral leg wound we will continue with sample 01/29/17; patient had his dermatology appointment but I can't yet see that note. He is completed his antibiotics. The wound is more superficial but considerably larger in circumferential area than when he came in. This is in his left lateral calf. He also has swollen erythematous areas with superficial wounds on the right leg and small papular areas on both arms. There apparently areas in her his upper thighs and buttocks I did not look at those. Dermatology biopsied the right leg. Hopefully will have their input next week. 02/05/17; patient went back to see his dermatologist who told him that he had a "scratching problem" as well as staph. He is now on a 30 day course of doxycycline and I believe she gave him triamcinolone cream to the right leg areas to help with the itching [not exactly sure but probably triamcinolone]. She apparently looked at the left lateral leg wound although this was not rebiopsied and I think felt to be ultimately part of the same pathogenesis. He is using sample border foam and changing nevus himself. He now has a new open area on the right posterior leg which was his biopsy site I don't have any  of the dermatology notes 02/12/17; we put the patient in compression last week with SANTYL to the wound on the left leg and the biopsy. Edema is much better and the depth of the wound is now at level of skin. Area is still the same oBiopsy site on the right lateral leg we've also been using santyl with a border foam dressing and he is changing this himself. 02/19/17; Using silver alginate started last week to both the substantial left leg wound and the biopsy site on the right wound. He is tolerating compression well. Has a an appointment with his primary M.D. tomorrow wondering about  diuretics although I'm wondering if the edema problem is actually lymphedema 02/26/17; the patient has been to see his primary doctor Dr. Jerrel Ivory at [place] [facility] our primary care. She started him on Lasix 20 mg and this seems to have helped with the edema. However we are not making substantial change with the left lateral calf wound and inflammation. The biopsy site on the right leg also looks stable but not really all that different. 03/12/17; the patient has been to see vein and vascular Dr. Lucky Cowboy. He has had venous reflux studies I have not reviewed these. I did get a call from his dermatology office. They felt that he might have pathergy based on their biopsy on his right leg which led them to look at the slides of ZIDANE, RENNER ([id number]) the biopsy I did on the left leg and they wonder whether this represents pyoderma gangrenosum which was the original supposition in a man with ulcerative colitis albeit inactive for many years. They therefore recommended clobetasol and tetracycline i.e. aggressive treatment for possible pyoderma gangrenosum. 03/26/17; apparently the patient just had reflux studies not an appointment with Dr. dew. She arrives in clinic today having applied clobetasol for 2-3 weeks. He notes over the last 2-3 days excessive drainage having to change the dressing 3-4 times a day and also expanding erythema. 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 Lillia Carmel at [facility] dermatology locally who kindly saw him at my request. 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 My concern was the degree of erythema medially extending from his left leg wound which was either  inflammation from pyoderma or cellulitis. I put him on Augmentin however culture of the wound showed Pseudomonas which is quinolone sensitive. I really don't believe he has cellulitis however in view of everything I will continue and give him a course of Cipro. He is also on doxycycline as an immune modulator for the pyoderma. In addition to his original wound on the left lateral leg with surrounding erythema he has a wound on the right posterior calf which was an original biopsy site done by dermatology. This was felt to represent pathergy from pyoderma gangrenosum 04/16/17; pyoderma gangrenosum. Saw Dr. Lillia Carmel yesterday. He has been using topical antibiotics to both wound areas his original wound on the left and the biopsies/pathergy area on the right. There is definitely some improvement in the inflammation around the wound on the right although the patient states he has increasing sensitivity of the wounds. He is on prednisone 60 and doxycycline 1 as prescribed by Dr. Lillia Carmel. He is covering the  topical antibiotic with gauze and putting this in his own compression stocks and changing this daily. He states that Dr. Lottie Rater did a culture of the left leg wound yesterday 05/07/17; pyoderma gangrenosum. The patient saw Dr. Lillia Carmel yesterday and has a follow-up with her in one month. He is still using topical antibiotics to both wounds although he can't recall exactly what type. He is still on prednisone 60 mg. Dr. Lillia Carmel stated that the doxycycline could stop if we were in agreement. 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 The right medial leg was induced by biopsy/pathergy. The area on the right is essentially healed. Still on high-dose prednisone using topical antibiotics to the wound 07/09/17; pyoderma gangrenosum with wounds on the left lateral leg. The right medial leg has  closed and remains closed. He is still on prednisone 60. oHe tells me he missed his last dermatology appointment with Dr. Lillia Carmel but will make another appointment. He reports that her blood sugar at a recent screen in [place] was high 200's. He was 180 today. 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 I think he is going to require still much longer prednisone perhaps another 3 months before attempting to taper. In the meantime his wound is a lot better. Smaller. He is cleaning this off daily and applying topical antibiotics. When he was last in the clinic I thought about changing to [facility] and actually put in a couple of calls to dermatology although probably not during their business hours. In any case the wound looks better smaller I don't think there is any need to change what he is doing 08/06/17-he is here in follow up evaluation for pyoderma left leg ulcer. He continues on oral prednisone. He has been using triple antibiotic ointment. There is surface debris and we will transition to [facility] and have him return in 2 weeks. He has lost 30 pounds since his last appointment with lifestyle modification. He may benefit from topical steroid cream for treatment this can be considered at a later date. 08/22/17 on evaluation today patient appears to actually be doing rather well in regard to his left lateral lower extremity ulcer. He has actually been managed by Dr. Dellia Nims most recently. Patient is currently on oral steroids at this time. This seems to have been of benefit for him. Nonetheless his last visit was actually with Leah on 08/06/17. Currently he is not utilizing any topical steroid creams although this could be of benefit as well. No fevers, chills, nausea, or vomiting noted at this time. 09/05/17 on evaluation today patient appears to be doing better in regard to his left lateral lower extremity ulcer. He has been tolerating the dressing changes without complication. He is using Santyl  with good effect. Overall I'm very pleased with how things are standing at this point. Patient likewise is happy that this is doing better. 09/19/17 on evaluation today patient actually appears to be doing rather well in regard to his left lateral lower extremity ulcer. Again this is secondary to Pyoderma gangrenosum and he seems to be progressing well with the Santyl which is good news. He's not having any significant pain. 10/03/17 on evaluation today patient appears to be doing excellent in regard to his lower extremity wound on the left secondary to Pyoderma gangrenosum. He has been tolerating the Santyl without complication and in general I feel like he's making good progress. 10/17/17  on evaluation today patient appears to be doing very well in regard to his left lateral lower surety ulcer. He has been tolerating the dressing changes without complication. There does not appear to be any evidence of infection he's alternating the Santyl and the triple antibiotic ointment every other day this seems to be doing well for him. 11/03/17 on evaluation today patient appears to be doing very well in regard to his left lateral lower extremity ulcer. He is been tolerating the dressing changes without complication which is good news. Fortunately there does not appear to be any evidence of infection which is also great news. Overall is doing excellent they are starting to taper down on the prednisone is down to 40 mg at this point it also started topical clobetasol for him. 11/17/17 on evaluation today patient appears to be doing well in regard to his left lateral lower surety ulcer. He's been tolerating the dressing changes without complication. He does note that he is having no pain, no excessive drainage or discharge, and overall he feels like things are going about how he would expect and hope they would. Overall he seems to have no evidence of infection at this time in my opinion which is  good news. 12/04/17-He is seen in follow-up evaluation for right lateral lower extremity ulcer. He has been applying topical steroid cream. Today's measurement show slight increase in size. Over the next 2 weeks we will transition to every other day Santyl and steroid cream. He has been encouraged to monitor for changes and notify clinic with any concerns 12/15/17 on evaluation today patient's left lateral motion the ulcer and fortunately is doing worse again at this point. This just since last week to this week has close to doubled in size according to the patient. I did not seeing last week's I do not have a visual to compare this to in our system was also down so we do not have all the charts and at this point. Nonetheless it does have me somewhat concerned in regard to the fact that again he was worried enough about it he has contact the dermatology that placed them back on the full strength, 50 mg a day of the prednisone that he was taken previous. He continues to alternate using clobetasol along with Santyl at this point. He is obviously somewhat frustrated. 12/22/17 on evaluation today patient appears to be doing a little worse compared to last evaluation. Unfortunately the wound is a little deeper and slightly larger than the last week's evaluation. With that being said he has made some progress in regard to the irritation surrounding at this time unfortunately despite that progress that's been made he still has a significant issue going on here. 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 DHILAN, BRAUER ([id number]) For that reason I discussed with him today the possibility of perform a wound culture to ensure there's nothing overtly infected. 01/06/18 on evaluation today patient's wound is larger and deeper than previously evaluated. With that being  said it did appear that his wound was infected after my last evaluation with him. Subsequently I did end up prescribing a prescription for Bactrim DS which she has been taking and having no complication with. Fortunately there does not appear to be any evidence of infection at this point in time as far as anything spreading, no want to touch, and  overall I feel like things are showing signs of improvement. 01/13/18 on evaluation today patient appears to be even a little larger and deeper than last time. There still muscle exposed in the base of the wound. Nonetheless he does appear to be less erythematous I do believe inflammation is calming down also believe the infection looks like it's probably resolved at this time based on what I'm seeing. No fevers, chills, nausea, or vomiting noted at this time. 01/30/18 on evaluation today patient actually appears to visually look better for the most part. Unfortunately those visually this looks better he does seem to potentially have what may be an abscess in the muscle that has been noted in the central portion of the wound. This is the first time that I have noted what appears to be fluctuance in the central portion of the muscle. With that being said I'm somewhat more concerned about the fact that this might indicate an abscess formation at this location. I do believe that an ultrasound would be appropriate. This is likely something we need to try to do as soon as possible. He has been switch to mupirocin ointment and he is no longer using the steroid ointment as prescribed by dermatology he sees them again next week he's been decreased from 60 to 40 mg of prednisone. 03/09/18 on evaluation today patient actually appears to be doing a little better compared to last time I saw him. There's not as much erythema surrounding the wound itself. He I did review his most recent infectious disease note which was dated 02/24/18. He saw Dr. Michel Bickers in [place].  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 Patient is now off of antibiotics and they are continually observing this. There seems to be no change in the past two weeks in my pinion based on what the patient says and what I see today compared to what Dr. Megan Salon likely saw two weeks ago. No fevers, chills, nausea, or vomiting noted at this time. 03/23/18 on evaluation today patient's wound actually appears to be showing signs of improvement which is good news. He is currently still on the Dapsone. He is also working on tapering the prednisone to get off of this and Dr. Lottie Rater is working with him in this regard. Nonetheless overall I feel like the wound is doing well it does appear based on the infectious disease note that I reviewed from Dr. 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 I did also review the results of his ultrasound of left lower extremity which revealed there was a dentist tissue in the base of the wound without an abscess noted. 04/06/18 on evaluation today the patient's left lateral lower extremity ulcer actually appears to be doing fairly well which is excellent news. There does not appear to be any evidence of infection at this time which is also great news. Overall he still does have a significantly large ulceration although little by little he seems to be making progress. He is down to 10 mg a day of the prednisone. 04/20/18 on evaluation today patient actually appears to be doing excellent at this time in regard to his left lower extremity ulcer. He's making signs of good progress unfortunately this is taking much longer than we would really like to see  but nonetheless he is making progress. Fortunately there does not appear to be any evidence of infection at this time. No  fevers, chills, nausea, or vomiting noted at this time. The patient has not been using the Santyl due to the cost he hadn't got in this field yet. He's mainly been using the antibiotic ointment topically. Subsequently he also tells me that he really has not been scrubbing in the shower I think this would be helpful again as I told him it doesn't have to be anything too aggressive to even make it believe just enough to keep it free of some of the loose slough and biofilm on the wound surface. 05/11/18 on evaluation today patient's wound appears to be making slow but sure progress in regard to the left lateral lower extremity ulcer. He is been tolerating the dressing changes without complication. Fortunately there does not appear to be any evidence of infection at this time. He is still just using triple antibiotic ointment along with clobetasol occasionally over the area. He never got the Santyl and really does not seem to intend to in my pinion. 06/01/18 on evaluation today patient appears to be doing a little better in regard to his left lateral lower extremity ulcer. He states that overall he does not feel like he is doing as well with the Dapsone as he did with the prednisone. Nonetheless he sees his dermatologist later today and is gonna talk to them about the possibility of going back on the prednisone. Overall again I believe that the wound would be better if you would utilize Santyl but he really does not seem to be interested in going back to the [place] at this point. He has been using triple antibiotic ointment. 06/15/18 on evaluation today patient's wound actually appears to be doing about the same at this point. Fortunately there is no signs of infection at this time. He has made slight improvements although he continues to not really want to clean the wound bed at this point. He states that he just doesn't mess with it he doesn't want to cause any problems with everything else he has going  on. He has been on medication, antibiotics as prescribed by his dermatologist, for a staff infection of his lower extremities which is really drying out now and looking much better he tells me. Fortunately there is no sign of overall infection. 06/29/18 on evaluation today patient appears to be doing well in regard to his left lateral lower surety ulcer all things considering. Fortunately his staff infection seems to be greatly improved compared to previous. He has no signs of infection and this is drying up quite nicely. He is still the doxycycline for this is no longer on cental, Dapsone, or any of the other medications. His dermatologist has recommended possibility of an infusion but right now he does not want to proceed with that. 07/13/18 on evaluation today patient appears to be doing about the same in regard to his left lateral lower surety ulcer. Fortunately there's no signs of infection at this time which is great news. Unfortunately he still builds up a significant amount of Slough/biofilm of the surface of the wound he still is not really cleaning this as he should be appropriately. Again I'm able to easily with saline and gauze remove the majority of this on the surface which if you would do this at home would likely be a dramatic improvement for him as far as getting the area to improve. Nonetheless overall I  still feel like he is making progress is just very slow. I think Santyl will be of benefit for him as well. Still he has not gotten this as of this point. 07/27/18 on evaluation today patient actually appears to be doing little worse in regards of the erythema around the periwound region of the wound he also tells me that he's been having more drainage currently compared to what he was experiencing last time I saw him. He states not quite as bad as what he had because this was infected previously but nonetheless is still appears to be doing poorly. Fortunately there is no evidence  of systemic infection at this point. The patient tells me that he is not going to be able to afford the Santyl. He is still waiting to hear about the infusion therapy with his dermatologist. Apparently she wants an updated colonoscopy first. 08/10/18 on evaluation today patient appears to be doing better in regard to his left lateral lower extremity ulcer. Fortunately he is showing signs of improvement in this regard he's actually been approved for Remicade infusion's as well although this has not been scheduled as of yet. Fortunately there's no signs of active infection at this time in regard to the wound although he is having some issues with infection of the right lower extremity is been seen as dermatologist for this. Fortunately they are definitely still working with him trying to keep things under control. DEQUAVIOUS, HARSHBERGER ([id number]) 09/07/18 on evaluation today patient is actually doing rather well in regard to his left lateral lower extremity ulcer. He notes these actually having some hair grow back on his extremity which is something he has not seen in years. He also tells me that the pain is really not giving them any trouble at this time which is also good news overall she is very pleased with the progress he's using a combination of the mupirocin along with the probate is all mixed. 09/21/18 on evaluation today patient actually appears to be doing fairly well all things considered in regard to his looks from the ulcer. He's been tolerating the dressing changes without complication. Fortunately there's no signs of active infection at this time which is good news he is still on all antibiotics or prevention of the staff infection. He has been on prednisone for time although he states it is gonna contact his dermatologist and see if she put them on a short course due to some irritation that he has going on currently. Fortunately there's no evidence of any overall worsening this is going very slow  I think cental would be something that would be helpful for him although he states that $50 for tube is quite expensive. He therefore is not willing to get that at this point. 10/06/18 on evaluation today patient actually appears to be doing decently well in regard to his left lateral leg ulcer. He's been tolerating the dressing changes without complication. Fortunately there's no signs of active infection at this time. Overall I'm actually rather pleased with the progress he's making although it's slow he doesn't show any signs of infection and he does seem to be making some improvement. I do believe that he may need a switch up and dressings to try to help this to heal more appropriately and quickly. 10/19/18 on evaluation today patient actually appears to be doing better in regard to his left lateral lower extremity ulcer. This is shown signs of having much less Slough buildup at this point due to the fact  he has been using the Santyl. Obviously this is very good news. The overall size of the wound is not dramatically smaller but again the appearance is. 11/02/18 on evaluation today patient actually appears to be doing quite well in regard to his lower [place] ulcer. A lot of the skin around the ulcer is actually somewhat irritating at this point this seems to be more due to the dressing causing irritation from the adhesive that anything else. Fortunately there is no signs of active infection at this time. 11/24/18 on evaluation today patient appears to be doing a little worse in regard to his overall appearance of his lower extremity ulcer. There's more erythema and warmth around the wound unfortunately. He is currently on doxycycline which he has been on for some time. With that being said I'm not sure that seems to be helping with what appears to possibly be an acute cellulitis with regard to his left lower extremity ulcer. No fevers, chills, nausea, or vomiting noted at this time. 12/08/18 on  evaluation today patient's wounds actually appears to be doing significantly better compared to his last evaluation. He has been using Santyl along with alternating tripling about appointment as well as the steroid cream seems to be doing quite well and the wound is showing signs of improvement which is excellent news. Fortunately there's no evidence of infection and in fact his culture came back negative with only normal skin flora noted. 12/21/2018 upon evaluation today patient actually appears to be doing excellent with regard to his ulcer. This is actually the best that I have seen it since have been helping to take care of him. It is both smaller as well as less slough noted on the surface of the wound and seems to be showing signs of good improvement with new skin growing from the edges. He has been using just the triamcinolone he does wonder if he can get a refill of that ointment today. 01/04/2019 upon evaluation today patient actually appears to be doing well with regard to his left lateral lower extremity ulcer. With that being said it does not appear to be that he is doing quite as well as last time as far as progression is concerned. There does not appear to be any signs of infection or significant irritation which is good news. With that being said I do believe that he may benefit from switching to a collagen based dressing based on how clean The wound appears. 01/18/2019 on evaluation today patient actually appears to be doing well with regard to his wound on the left lower extremity. He is not made a lot of progress compared to where we were previous but nonetheless does seem to be doing okay at this time which is good news. There is no signs of active infection which is also good news. My only concern currently is I do wish we can get him into utilizing the collagen dressing his insurance would not pay for the supplies that we ordered although it appears that he may be able to order this  through his supply company that he typically utilizes. This is [employer]. Nonetheless he did try to order it during the office visit today and it appears this did go through. We will see if he can get that it is a different brand but nonetheless he has collagen and I do think will be beneficial. 02/01/2019 on evaluation today patient actually appears to be doing a little worse today in regard to the overall size of  his wounds. Fortunately there is no signs of active infection at this time. That is visually. Nonetheless when this is happened before it was due to infection. For that reason were somewhat concerned about that this time as well. 02/08/2019 on evaluation today patient unfortunately appears to be doing slightly worse with regard to his wound upon evaluation today. Is measuring a little deeper and a little larger unfortunately. I am not really sure exactly what is causing this to enlarge he actually did see his dermatologist she is going to see about initiating Humira for him. Subsequently she also did do steroid injections into the wound itself in the periphery. Nonetheless still nonetheless he seems to be getting a little bit larger he is gone back to just using the steroid cream topically which I think is appropriate. I would say hold off on the collagen for the time being is definitely a good thing to do. Based on the culture results which we finally did get the final result back regarding it shows staph as the bacteria noted again that can be a normal skin bacteria based on the fact however he is having increased drainage and worsening of the wound measurement wise I would go ahead and place him on an antibiotic today I do believe for this. 02/15/2019 on evaluation today patient actually appears to be doing somewhat better in regard to his ulcer. There is no signs of worsening at this time I did review his culture results which showed evidence of Staphylococcus aureus but not MRSA. Again  this could just be more related to the normal skin bacteria although he states the drainage has slowed down quite a bit he may have had a mild infection not just colonization. And was much smaller and then since around10/04/2019 on evaluation today patient appears to be doing unfortunately worse as far as the size of the wound. I really feel like that this is steadily getting larger again it had been doing excellent right at the beginning of September we have seen a steady increase in the area of the wound it is almost 2-1/2 times the size it was on September 1. Obviously this is a bad trend this is not wanting to see. For that reason we went back to using just the topical triamcinolone cream which does seem to help with inflammation. I checked him for bacteria by way of culture and nothing showed positive there. I am considering giving him a short course of a tapering steroid Dosepak today to see if that is can be beneficial for him. The patient is in agreement with giving that a try. 03/08/2019 on evaluation today patient appears to be doing very well in comparison to last evaluation with regard to his lower extremity ulcer. This is showing signs of less inflammation and actually measuring slightly smaller compared to last time every other week over the past month and a half he has been measuring larger larger larger. Nonetheless I do believe that the issue has been inflammation the prednisone does seem to [facility] [place] ([id number]) have been beneficial for him which is good news. No fevers, chills, nausea, vomiting, or diarrhea. 03/22/2019 on evaluation today patient appears to be doing about the same with regard to his leg ulcer. He has been tolerating the dressing changes without complication. With that being said the wound seems to be mostly arrested at its current size but really is not making any progress except for when we prescribed the prednisone. He did show some signs of  dropping as far as  the overall size of the wound during that interval week. Nonetheless this is something he is not on long-term at this point and unfortunately I think he is getting need either this or else the Humira which his dermatologist has discussed try to get approval for. With that being said he will be seeing his dermatologist on the 11th of this month that is November. 04/19/2019 on evaluation today patient appears to be doing really about the same the wound is measuring slightly larger compared to last time I saw him. He has not been into the office since November 2 due to the fact that he unfortunately had Covid as that his entire family. He tells me that it was rough but they did pull-through and he seems to be doing much better. Fortunately there is no signs of active infection at this time. No fevers, chills, nausea, vomiting, or diarrhea. 05/10/2019 on evaluation today patient unfortunately appears to be doing significantly worse as compared to last time I saw him. He does tell me that he has had his first dose of Humira and actually is scheduled to get the next one in the upcoming week. With that being said he tells me also that in the past several days he has been having a lot of issues with green drainage she showed me a picture this is more blue-green in color. He is also been having issues with increased sloughy buildup and the wound does appear to be larger today. Obviously this is not the direction that we want everything to take based on the starting of his Humira. Nonetheless I think this is definitely a result of likely infection and to be honest I think this is probably Pseudomonas causing the infection based on what I am seeing. 05/24/2019 on evaluation today patient unfortunately appears to be doing significantly worse compared to his prior evaluation with me 2 weeks ago. I did review his culture results which showed that he does have Staph aureus as well as Pseudomonas noted on the culture.  Nonetheless the Levaquin that I prescribed for him does not appear to have been appropriate and in fact he tells me he is no longer experiencing the green drainage and discharge that he had at the last visit. Fortunately there is no signs of active infection at this time which is good news although the wound has significantly worsened it in fact is much deeper than it was previous. We have been utilizing up to this point triamcinolone ointment as the prescription topical of choice but at this time I really feel like that the wound is getting need to be packed in order to appropriately manage this due to the deeper nature of the wound. Therefore something along the lines of an alginate dressing may be more appropriate. 05/31/2019 upon inspection today patient's wound actually showed signs of doing poorly at this point. Unfortunately he just does not seem to be making any good progress despite what we have tried. He actually did go ahead and pick up the Cipro and start taking that as he was noticing more green drainage he had previously completed the Levaquin that I prescribed for him as well. Nonetheless he missed his appointment for the seventh last week on Wednesday with the wound care center and [facility] where his dermatologist referred him. Obviously I do think a second opinion would be helpful at this point especially in light of the fact that the patient seems to be doing so  poorly despite the fact that we have tried everything that I really know how at this point. The only thing that ever seems to have helped him in the past is when he was on high doses of continual steroids that did seem to make a difference for him. 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 06/07/2019 upon evaluation today patient unfortunately appears to be doing  worse yet again with regard to his wound. In fact I am starting to question whether or not he may have a fluid pocket in the muscle at this point based on the bulging and the soft appearance to the central portion of the muscle area. There is not anything draining from the muscle itself at this time which is good news but nonetheless the wound is expanding. I am not really seeing any results of the Humira as far as overall wound progression based on what I am seeing at this point. The patient has been referred for second opinion with regard to his wound to the [facility] wound care center by his dermatologist which I definitely am not in opposition to. Unfortunately we tried multiple dressings in the past including collagen, alginate, and at one point even Hydrofera Blue. With that being said he is never really used it for any significant amount of time due to the fact that he often complains of pain associated with these dressings and then will go back to either using the Santyl which she has done intermittently or more frequently the triamcinolone. He is also using his own compression stockings. We have wrapped him in the past but again that was something else that he really was not a big fan of. 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 He seemed to do much better when he was on the steroids although honestly I understand that the steroids are not necessarily the best medication to be on long-term obviously 06/14/2019 on evaluation today patient appears to be doing actually a little bit better with regard to the overall appearance with his leg. Unfortunately he does continue to have  issues with what appears to be some fluid underneath the muscle although he did see the wound specialty center at [facility] last week their main goals were to see about infusion therapy in place of the Humira as they feel like that is not quite strong enough. They also recommended that we continue with the treatment otherwise as we are they felt like that was appropriate and they are okay with him continuing to follow-up here with [place] in that regard. With that being said they are also sending him to the vein specialist there to see about vein stripping and if that would be of benefit for him. Subsequently they also did not really address whether or not an ultrasound of the muscle area to see if there is anything that needs to be addressed here would be appropriate or not. For that reason I discussed this with him last  week I think we may proceed down that road at this point. 06/21/2019 upon evaluation today patient's wound actually appears to be doing slightly better compared to previous evaluations. I do believe that he has made a difference with regard to the progression here with the use of oral steroids. Again in the past has been the only thing that is really calm things down. He does tell me that from [facility] is gotten a good news from there that there are no further vein stripping that is necessary at this point. I do not have that available for review today although the patient did relay this to me. He also did obtain and have the ultrasound of the wound completed which I did sign off on today. It does appear that there is no fluid collection under the muscle this is likely then just edematous tissue in general. That is also good news. Overall I still believe the inflammation is the main issue here. He did inquire about the possibility of a wound VAC again with the muscle protruding like it is I am not really sure whether the wound VAC is necessarily ideal or not. That is something we will have to consider  although I do believe he may need compression wrapping to try to help with edema control which could potentially be of benefit. 06/28/2019 on evaluation today patient appears to be doing slightly better measurement wise although this is not terribly smaller he least seems to be trending towards that direction. With that being said he still seems to have purulent drainage noted in the wound bed at this time. He has been on Levaquin followed by Cipro over the past month. Unfortunately he still seems to have some issues with active infection at this time. I did perform a culture last week in order to evaluate and see if indeed there was still anything going on. Subsequently the culture did come back showing Pseudomonas which is consistent with the drainage has been having which is blue-green in color. He also has had an odor that again was somewhat consistent with Pseudomonas as well. Long story short it appears that the culture showed an intermediate finding with regard to how well the Cipro will work for the Pseudomonas infection. Subsequently being that he does not seem to be clearing up and at best what we are doing is just keeping this at [place] I think he may need to see infectious disease to discuss IV antibiotic options. FAIZAAN, FALLS ([id number]) 07/05/2019 upon evaluation today patient appears to be doing okay in regard to his leg ulcer. He has been tolerating the dressing changes at this point without complication. Fortunately there is no signs of active infection at this time which is good news. No fevers, chills, nausea, vomiting, or diarrhea. With that being said he does have an appointment with infectious disease tomorrow and his primary care on Wednesday. Again the reason for the infectious disease referral was due to the fact that he did not seem to be fully resolving with the use of oral antibiotics and therefore we were thinking that IV antibiotic therapy may be necessary secondary to the  fact that there was an intermediate finding for how effective the Cipro may be. Nonetheless again he has been having a lot of purulent and even green drainage. Fortunately right now that seems to have calmed down over the past week with the reinitiation of the oral antibiotic. Nonetheless we will see what Dr. Megan Salon has to say. 07/12/2019 upon evaluation today  patient appears to be doing about the same at this point in regard to his left lower extremity ulcer. Fortunately there is no signs of active infection at this time which is good news I do believe the Levaquin has been beneficial I did review Dr. Hale Bogus note and to be honest I agree that the patient's leg does appear to be doing better currently. What we found in the past as he does not seem to really completely resolve he will stop the antibiotic and then subsequently things will revert back to having issues with blue-green drainage, increased pain, and overall worsening in general. Obviously that is the reason I sent him back to infectious disease. 07/19/2019 upon evaluation today patient appears to be doing roughly the same in size there is really no dramatic improvement. He has started back on the Levaquin at this point and though he seems to be doing okay he did still have a lot of blue/green drainage noted on evaluation today unfortunately. I think that this is still indicative more likely of a Pseudomonas infection as previously noted and again he does see Dr. Megan Salon in just a couple of days. I do not know that were really able to effectively clear this with just oral antibiotics alone based on what I am seeing currently. Nonetheless we are still continue to try to manage as best we can with regard to the patient and his wound. I do think the wrap was helpful in decreasing the edema which is excellent news. No fevers, chills, nausea, vomiting, or diarrhea. 07/26/2019 upon evaluation today patient appears to be doing slightly better with  regard to the overall appearance of the muscle there is no dark discoloration centrally. Fortunately there is no signs of active infection at this time. No fevers, chills, nausea, vomiting, or diarrhea. Patient's wound bed currently the patient did have an appointment with Dr. Megan Salon at infectious disease last week. With that being said Dr. Megan Salon the patient states was still somewhat hesitant about put him on any IV antibiotics he wanted [place] to repeat cultures today and then see where things go going forward. He does look like Dr. Megan Salon because of some improvement the patient did have with the Levaquin wanted [place] to see about repeating cultures. If it indeed grows the Pseudomonas again then he recommended a possibility of considering a PICC line placement and IV antibiotic therapy. He plans to see the patient back in 1 to 2 weeks. 08/02/2019 upon evaluation today patient appears to be doing poorly with regard to his left lower extremity. We did get the results of his culture back it shows that he is still showing evidence of Pseudomonas which is consistent with the purulent/blue-green drainage that he has currently. Subsequently the culture also shows that he now is showing resistance to the oral fluoroquinolones which is unfortunate as that was really the only thing to treat the infection prior. I do believe that he is looking like this is going require IV antibiotic therapy to get this under control. Fortunately there is no signs of systemic infection at this time which is good news. The patient does see Dr. Megan Salon tomorrow. 08/09/2019 upon evaluation today patient appears to be doing better with regard to his left lower extremity ulcer in regard to the overall appearance. He is currently on IV antibiotic therapy. As ordered by Dr. Megan Salon. Currently the patient is on ceftazidime which she is going to take for the next 2 weeks and then follow-up for 4 to 5-week  appointment with Dr. Megan Salon.  The patient started this this past Friday symptoms have not for a total of 3 days currently in full. 08/16/2019 upon evaluation today patient's wound actually does show muscle in the base of the wound but in general does appear to be much better as far as the overall evidence of infection is concerned. In fact I feel like this is for the most part cleared up he still on the IV antibiotics he has not completed the full course yet but I think he is doing much better which is excellent news. 08/23/2019 upon evaluation today patient appears to be doing about the same with regard to his wound at this point. He tells me that he still has pain unfortunately. Fortunately there is no evidence of systemic infection at this time which is great news. There is significant muscle protrusion. 09/13/19 upon evaluation today patient appears to be doing about the same in regard to his leg unfortunately. He still has a lot of drainage coming from the ulceration there is still muscle exposed. With that being said the patient's last wound culture still showed an intermediate finding with regard to the Pseudomonas he still having the bluish/green drainage as well. Overall I do not know that the wound has completely cleared of infection at this point. Fortunately there is no signs of active infection systemically at this point which is good news. 09/20/2019 upon evaluation today patient's wound actually appears to be doing about the same based on what I am seeing currently. I do not see any signs of systemic infection he still does have evidence of some local infection and drainage. He did see Dr. Megan Salon last week and Dr. Megan Salon states that he probably does need a different IV antibiotic although he does not want to put him on this until the patient begins the Remicade infusion which is actually scheduled for about 10 days out from today on 13 May. Following that time Dr. Megan Salon is good to see him back and then will  evaluate the feasibility of starting him on the IV antibiotic therapy once again at that point. I do not disagree with this plan I do believe as Dr. Megan Salon stated in his note that I reviewed today that the patient's issue is multifactorial with the pyoderma being 1 aspect of this that were hoping the Remicade will be helpful for her. In the meantime I think the gentamicin is, helping to keep things under decent okay control in regard to the ulcer. 09/27/2019 upon evaluation today patient appears to be doing about the same with regard to his wound still there is a lot of muscle exposure though he does have some hyper granulation tissue noted around the edge and actually some granulation tissue starting to form over the muscle which is actually good news. Fortunately there is no evidence of active infection which is also good news. His pain is less at this point. 5/21; this is a patient I have not seen in a long time. 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 He has a large wound on the left lateral leg with protruding muscle. He comes in the clinic today showing the same area on his left medial ankle. He says there is been a spot there for some time although we have not previously defined this. Today he has a clearly defined area with slight amount of skin breakdown surrounded by raised areas with a purplish hue in color. This is not painful he  says it is irritated. 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 He still has muscle protruding from the lateral portion of his left leg but fortunately the new area that they were concerned about at his last visit does not appear to have opened at this point. He is currently on Remicade infusions and seems to be doing better in my opinion in fact the wound itself seems to be overall much better. The purplish  discoloration that he did have seems to have resolved and I think that is a good sign that hopefully the Remicade is doing its job. He does have some biofilm noted over the surface of the wound. 11/01/2019 on evaluation today patient's wound actually appears to be doing excellent at this time. Fortunately there is no evidence of active infection and overall I feel like he is making great progress. The Remicade seems to be due excellent job in my opinion. DEMETRIES, COIA ([id number]) 11/08/19 evaluation today vision actually appears to be doing quite well with regard to his weight ulcer. He's been tolerating dressing changes without complication. Fortunately there is no evidence of infection. No fevers, chills, nausea, or vomiting noted at this time. Overall states that is having more itching than pain which is actually a good sign in my opinion. 12/13/2019 upon evaluation today patient appears to be doing well today with regard to his wound. He has been tolerating the dressing changes without complication. Fortunately there is no sign of active infection at this time. No fevers, chills, nausea, vomiting, or diarrhea. Overall I feel like the infusion therapy has been very beneficial for him. 01/06/2020 on evaluation today patient appears to be doing well with regard to his wound. This is measuring smaller and actually looks to be doing better. Fortunately there is no signs of active infection at this point. No fevers, chills, nausea, vomiting, or diarrhea. With that being said he does still have the blue-green drainage but this does not seem to be causing any significant issues currently. He has been using the gentamicin that does seem to be keeping things under decent control at this point. He goes later this morning for his next infusion therapy for the pyoderma which seems to also be very beneficial. 02/07/2020 on evaluation today patient appears to be doing about the same in regard to his wounds  currently. Fortunately there is no signs of active infection systemically he does still have evidence of local infection still using gentamicin. He also is showing some signs of improvement albeit slowly I do feel like we are making some progress here. 02/21/2020 upon evaluation today patient appears to be making some signs of improvement the wound is measuring a little bit smaller which is great news and overall I am very pleased with where he stands currently. He is going to be having infusion therapy treatment on the 15th of this month. Fortunately there is no signs of active infection at this time. 03/13/2020 I do believe patient's wound is actually showing some signs of improvement here which is great news. He has continue with the infusion therapy through rheumatology/dermatology at [facility]. That does seem to be beneficial. I still think he gets as much benefit from this as he did from the prednisone initially but nonetheless obviously this is less harsh on his body that the prednisone as far as they are concerned. 03/31/2020 on evaluation today patient's wound actually showing signs of some pretty  good improvement in regard to the overall appearance of the wound bed. There is still muscle exposed though he does have some epithelial growth around the edges of the wound. Fortunately there is no signs of active infection at this time. No fevers, chills, nausea, vomiting, or diarrhea. 04/24/2020 upon evaluation today patient appears to be doing about the same in regard to his leg ulcer. He has been tolerating the dressing changes without complication. Fortunately there is no signs of active infection at this time. No fevers, chills, nausea, vomiting, or diarrhea. With that being said he still has a lot of irritation from the bandaging around the edges of the wound. We did discuss today the possibility of a referral to plastic surgery. 05/22/2020 on evaluation today patient appears to be doing well with  regard to his wounds all things considered. He has not been able to get the Chantix apparently there is a recall nurse that I was unaware of put out by [employer] involuntarily. Nonetheless for now I am and I have to do some research into what may be the best option for him to help with quitting in regard to smoking and we discussed that today. 06/26/2020 upon evaluation today patient appears to be doing well with regard to his wound from the standpoint of infection I do not see any signs of infection at this point. With that being said unfortunately he is still continuing to have issues with muscle exposure and again he is not having a whole lot of new skin growth unfortunately. There does not appear to be any signs of active infection at this time. No fevers, chills, nausea, vomiting, or diarrhea. 07/10/2020 upon evaluation today patient appears to be doing a little bit more poorly currently compared to where he was previous. I am concerned currently about an active infection that may be getting worse especially in light of the increased size and tenderness of the wound bed. No fevers, chills, nausea, vomiting, or diarrhea. 07/24/2020 upon evaluation today patient appears to be doing poorly in regard to his leg ulcer. He has been tolerating the dressing changes without complication but unfortunately is having a lot of discomfort. 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 Subsequently I think he is going require possibly IV antibiotics to get this under control. I am very concerned about the severity of his infection and the amount of discomfort he is having. 07/31/2020 upon evaluation today patient appears to be doing about the same in regard to his leg wound. He did see Dr. Megan Salon and Dr. Megan Salon is actually going to start him on IV antibiotics. He goes for the PICC line tomorrow. With that being said there do not have that run for  2 weeks and then see how things are doing and depending on how he is progressing they may extend that a little longer. Nonetheless I am glad this is getting ready to be in place and definitely feel it may help the patient. In the meantime is been using mainly triamcinolone to the wound bed has an anti-inflammatory. 08/07/2020 on evaluation today patient appears to be doing well with regard to his wound compared even last week. In the interim he has gotten the PICC line placed and overall this seems to be doing excellent. There does not appear to be any evidence of infection which is great news systemically although locally of course has had the infection this appears to be improving with the use of  the antibiotics. 08/14/2020 upon evaluation today patient's wound actually showing signs of excellent improvement. Overall the irritation has significantly improved the drainage is back down to more of a normal level and his pain is really pretty much nonexistent compared to what it was. Obviously I think that this is significantly improved secondary to the IV antibiotic therapy which has made all the difference in the world. Again he had a resistant form of Pseudomonas for which oral antibiotics just was not cutting it. Nonetheless I do think that still we need to consider the possibility of a surgical closure for this wound is been open so long and to be honest with muscle exposed I think this can be very hard to get this to close outside of this although definitely were still working to try to do what we can in that regard. 08/21/2020 upon evaluation today patient appears to be doing very well with regard to his wounds on the left lateral lower extremity/calf area. Fortunately there does not appear to be signs of active infection which is great news and overall very pleased with where things stand today. He is actually wrapping up his treatment with IV antibiotics tomorrow. After that we will see where  things go from there. 08/28/2020 upon evaluation today patient appears to be doing decently well with regard to his leg ulcer. There does not appear to be any signs of active infection which is great news and overall very pleased with where things stand today. No fevers, chills, nausea, vomiting, or diarrhea. 09/18/2020 upon evaluation today patient appears to be doing well with regard to his infection which I feel like is better. Unfortunately he is not doing as well with regard to the overall size of the wound which is not nearly as good at this point. I feel like that he may be having an issue here with the pyoderma being somewhat out of control. I think that he may benefit from potentially going back and talking to the dermatologist about KHYLER, [name] ([id number]) what to do from the pyoderma standpoint. I am not certain if the infusions are helping nearly as much is what the prednisone did in the past. 10/02/2020 upon evaluation today patient appears to be doing well with regard to his leg ulcer. He did go to the Psychiatric nurse. Unfortunately they feel like there is a 10% chance that most that he would be able to heal and that the skin graft would take. Obviously this has led him to not be able to go down that path as far as treatment is concerned. Nonetheless he does seem to be doing a little bit better with the prednisone that I gave him last time. I think that he may need to discuss with dermatology the possibility of long-term prednisone as that seems to be what is most helpful for him to be perfectly honest. I am not sure the Remicade is really doing the job. 10/17/2020 upon evaluation today patient appears to be doing a little better in regard to his wound. In fact the case has been since we did the prednisone on May 2 for him that we have noticed a little bit of improvement each time we have seen a size wise as well as appearance wise as well as pain wise. I think the prednisone has had a  greater effect then the infusion therapy has to be perfectly honest. With that being said the patient also feels significantly better compared to what he was previous. All of this  is good news but nonetheless I am still concerned about the fact that again we are really not set up to long-term manage him as far as prednisone is concerned. Obviously there are things that you need to be watched I completely understand the risk of prednisone usage as well. 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 With all that being said I do believe that we can give him another round of the prednisone which she is requesting today because of the improvement that he seen since we did that first round. 10/30/2020 upon evaluation today patient's wound actually is showing signs of doing quite well. There does not appear to be any evidence of infection which is great news and overall very pleased with where things stand today. No fevers, chills, nausea, vomiting, or diarrhea. He tells me that the prednisone still has seem to have helped he wonders if we can extend that for just a little bit longer. He did not have the appointment with a dermatologist although he did have an infusion appointment last Friday. That was at [facility]. With that being said he tells me he could not do both that as well as the appointment with the physician on the same day therefore that is can have to be rescheduled. I really want to see if there is anything they feel like that could be done differently to try to help this out as I am not really certain that the infusions are helping significantly here. 11/13/2020 upon evaluation today patient unfortunately appears to be doing somewhat poorly in regard to his wound I feel like this is actually worsening from the standpoint of the pyoderma spreading. I still feel like that he may need something different as far as trying to manage this going forward. Again we  did the prednisone unfortunately his blood sugars are not doing so well because of this. Nonetheless I believe that the patient likely needs to try topical steroid. We have done triamcinolone for a while I think going with something stronger such as clobetasol could be beneficial again this is not something I do lightly I discussed this with the patient that again this does not normally put underneath an occlusive dressing. Nonetheless I think a thin film as such could help with some of the stronger anti-inflammatory effects. We discussed this today. He would like to try to give this a trial for the next couple weeks. I definitely think that is something that we can do. Evaluate7/03/2021 and today patient's wound bed actually showed signs of doing really about the same. There was a little expansion of the size of the wound and that leading edge that we done looking out although the clobetasol does seem to have slowed this down a bit in my opinion. There is just 1 small area that still seems to be progressing based on what I see. Nonetheless I am concerned about the fact this does not seem to be improving if anything seems to be doing a little bit worse. I do not know that the infusions are really helping him much as next infusion is August 5 his appointment with dermatology is July 25. Either way I really think that we need to have a conversation potentially about this and I am actually going to see if I can talk with Dr. Lillia Carmel in order to see where things stand as well. 12/11/2020 upon evaluation today patient appears to be doing worse in regard to  his leg ulcer. Unfortunately I just do not think this is making the progress that I would like to see at this point. Honestly he does have an appointment with dermatology and this is in 2 days. I am wondering what they may have to offer to help with this. Right now what I am seeing is that he is continuing to show signs of worsening little by little.  Obviously that is not great at all. Is the exact opposite of what we are looking for. 12/18/2020 upon evaluation today patient appears to be doing a little better in regard to his wound. The dermatologist actually did do some steroid injections into the wound which does seem to have been beneficial in my opinion. That was on the 25th already this looks a little better to me than last time I saw him. With that being said we did do a culture and this did show that he has Staph aureus noted in abundance in the wound. With that being said I do think that getting him on an oral antibiotic would be appropriate as well. Also think we can compression wrap and this will make a difference as well. 12/28/2020 upon evaluation today patient's wound is actually showing signs of doing much better. I do believe the compression wrap is helping he has a lot of drainage but to be honest I think that the compression is helping to some degree in this regard as well as not draining through which is also good news. No fevers, chills, nausea, vomiting, or diarrhea. 01/04/2021 upon evaluation today patient appears to be doing well with regard to his wound. Overall things seem to be doing quite well. He did have a little bit of reaction to the CarboFlex Sorbact he will be using that any longer. With that being said he is controlled as far as the drainage is concerned overall and seems to be doing quite well. I do not see any signs of active infection at this time which is great news. No fevers, chills, nausea, vomiting, or diarrhea. 01/11/2021 upon evaluation today patient appears to be doing well with regard to his wounds. He has been tolerating the dressing changes without complication. Fortunately there does not appear to be any signs of active infection at this time which is great news. Overall I am extremely pleased with where we stand currently. No fevers, chills, nausea, vomiting, or diarrhea. Where using clobetasol in the  wound bed he has a lot of new skin growth which is awesome as well. 01/18/2021 upon evaluation today patient appears to be doing very well in regard to his leg ulcer. He has been tolerating the dressing changes without complication. Fortunately there does not appear to be any signs of active infection which is great news. In general I think that he is making excellent progress 01/25/2021 upon evaluation today patient appears to be doing well with regard to his wound on the leg. I am actually extremely pleased with where things stand today. There does not appear to be any signs of active infection which is great news and overall I think that we are definitely headed in the appropriate direction based on what I am seeing currently. There does not appear to be any signs of active infection also excellent news. 02/06/2021 upon evaluation today patient appears to be doing well with regard to his wound. Overall visually this is showing signs of significant improvement which is great news. I do not see any signs of active infection systemically which  is great even locally I do not think that we are seeing any major complications here. We did do fluorescence imaging with the [employer] today. The patient does have some odor and drainage noted and again this is something that I think would benefit him to probably come more frequently for nurse visits. 02/19/2021 upon evaluation today patient actually appears to be doing quite well in regard to his wound. He has been tolerating the dressing Mcduffie, Eero ([id number]) changes without complication and overall I think that this is making excellent progress. I do not see any evidence of active infection at this point which is great news as well. No fevers, chills, nausea, vomiting, or diarrhea. 10/10; wound is made nice progress healthy granulation with a nice rim of epithelialization which seems to be expanding even from last week he has a deeper area in the  inferior part of the more distal part of the wound with not quite as healthy as surface. This area will need to be followed. Using clobetasol and Hydrofera Blue 03/05/2021 upon evaluation today patient appears to be doing very well in regard to his leg ulcer. He has been tolerating dressing changes without complication. Fortunately there does not appear to be any signs of active infection which is great news and overall I am extremely pleased with where we stand currently. 03/12/2021 upon evaluation today patient appears to be doing well with regard to his wound in fact this is extremely extremely good based on what we are seeing today there does not appear to be any signs of active infection and overall I think that he is doing awesome from the standpoint of healing in general. I am extremely pleased with how things seem to be progressing with regard to this pyoderma. Clobetasol has done wonders for him. I think the compression wrapping has also been of great benefit. 03/19/2021 upon evaluation today patient appears to be doing well with regard to his wound. He is tolerating the dressing changes without complication. In fact I feel like that he is actually making excellent progress at this point based on what I am seeing. No fevers, chills, nausea, vomiting, or diarrhea. 03/26/2021 upon evaluation today patient appears to be doing well with regard to his wound. This again is measuring smaller and looking better. Again the progress is slow but nonetheless continual with what we have been seeing. I do believe that the current plan is doing awesome for him. 04/09/2021 upon evaluation today patient appears to be doing well with regard to his leg ulcer. This is showing signs of excellent improvement the muscle is completely closed over and there does not appear to be any evidence of inflammation at this point his drainage is significantly improved. Overall I think that he would be a good candidate for  looking into a skin substitute at this point as well. We will get a look into some approvals in that regard. Potentially TheraSkin as well as Apligraf could both be considered just depending on insurance coverage. 04/16/2021 upon evaluation today patient appears to be doing well at this point. He has been approved for the Apligraf which we could definitely order although I would like to try to get the TheraSkin approved if at all possible. I did fax notes into them today and I Georgina Peer try to give a call as well. Overall the wound appears to be doing decently well today. 04/20/2021 upon evaluation today patient actually appears to be doing quite well in regard to his wound with  that being said we are trying to see what we can do about speeding up the healing process. For that reason we did discuss the possibility of a skin substitute. We got the Apligraf approved. For that reason we will get a go ahead and see what we can do with the Apligraf at this point. I am still trying to get the TheraSkin approved but I have not heard anything from the insurance company yet I have called and talked to them earlier in the week and to be honest this was about half an hour that I spent on the phone they told me I would hear something within 10-15 business days. 04/27/2021 upon evaluation today patient appears to be doing excellent in regard to his wound. I do believe the Apligraf has been beneficial. With that being said he has had a little bit of increased pain has been a little bit concerned. I do want to go ahead and apply his steroid cream today the clobetasol and then put the Apligraf over I just do not think I want to risk not putting the clobetasol on based on what is been going on here currently. Patient voiced understanding. 05/04/2021 upon evaluation today patient is making excellent improvement. Overall there is definitely decrease in the size of the wound today and I am very pleased in that regard. I do not  see any signs of active infection locally nor systemically at this point. No fevers, chills, nausea, vomiting, or diarrhea. 05/11/2021 upon evaluation today patient appears to be doing well with regard to his wound. He has been tolerating Apligraf's without complication and is making excellent progress this is application #4 today. 12/30; patient here for Apligraf application. Appears to be doing well small wound there has been a lot of healing 05/28/2021 upon evaluation today patient appears to be doing well with regard to his wound. Has been tolerating the dressing changes without complication. Fortunately I do not see any signs of active infection locally nor systemically at this point which is great news. He is done with the Apligraf and the first round and overall this has filled in quite nicely I am not even certain he needs any additional at the moment. I would actually try to go back to clobetasol with [facility] which previously was doing well for him. 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 He is using clobetasol and Hydrofera Blue. Surface area of the wound is smaller and the surface looks very healthy. 06/11/2021 upon evaluation today patient actually appears to be showing signs of good improvement which is great news. Fortunately I do not see any signs of active infection at this time which is also excellent news. I do believe that the patient is doing well with the clobetasol and Hydrofera Blue. He does have some irritation and itching around the rest of the leg will use some triamcinolone over this area. 06/18/2021 upon evaluation today patient appears to be doing well with regard to his wound. He is showing signs of excellent improvement and overall I am extremely pleased with where we stand today. We are moving in the right direction. 06/25/2021 upon evaluation today patient appears to be doing well with regard to his wound. In fact  this is doing also not showing signs of excellent improvement overall very pleased with where we stand today. I do not see any evidence of active infection locally or systemically at this time which is also great news. 07/02/2021  upon evaluation patient's wound bed actually showed signs of good granulation and epithelization at this point. And this is measuring significantly smaller and overall I am extremely pleased with where we stand today. No fevers, chills, nausea, vomiting, or diarrhea. 07/09/2021 upon evaluation patient's wound bed actually showed signs of good granulation and epithelization at this point. Fortunately there does not appear to be any evidence of active infection locally or systemically which is great news and overall I am extremely pleased with where we stand at this point. 07/16/2021 upon evaluation today patient appears to be doing well with regard to his wound all things considered although it is maybe a little bit larger than what its been. This does have me a little concerned. Actually question about whether anything is changed and initially he told me TYSIN, SALADA ([id number]) know. In fact it was not until the end of the visit that he actually did mention that he had missed his infusion in January. This very well may be what is because the difference is also seeing currently. That definitely has seem to been helping more recently. 07/23/2021 upon evaluation today patient's wound is yet again larger despite the switch to a collagen which was not beneficial. Subsequently I am going to at this point check on a couple things. First and foremost I do want to go ahead and do a culture. I think that there is a chance he could have a low-lying infection that may be part of the issue here. Subsequently I am also probably can go ahead and place him on Bactrim DS which I think is a good option and has done well with them in the past. Again if this is an infection that should hopefully  help to turn things around. With all that being said I do believe that the patient should hopefully be able to turn this around with reinitiation of the infusion therapy which will be going on this week and subsequently getting on the antibiotic. 3/13; patient presents for follow-up. He has no issues or complaints today. He states he is currently on oral antibiotics for previous culture done in the office. 08/06/2021 upon evaluation today patient appears to be doing well with regard to his wound. I am actually seeing signs of improvement which is great news. I think that he is on a better track he did receive his infusion as well which is great news and overall I think that this should hopefully get him back on track as far as healing is concerned. He is not having any pain and I do not see any signs of inflammation which is great news. 08/13/2021 upon evaluation today patient appears to be doing excellent in regard to his wound on the leg. He has been tolerating the dressing changes without complication. Fortunately I do not see any signs of infection I do believe he is making good progress here and I think her back on track overall the tissue is greatly improved compared to what we have been. 08/20/2021 upon evaluation today patient appears to be doing excellent in regard to his wound. He has been tolerating the dressing changes without complication. Fortunately I do not see any evidence of infection at this time locally or systemically which is great news and overall very pleased with where we stand. 08-31-2021 upon evaluation today patient appears to be doing excellent in regard to his wound. Overall even with the vacation and extra walking he seems to have really done well. Fortunately I do not see  any evidence of active infection locally or systemically which is great news and overall I think we are headed in the right direction. 09-10-2021 upon evaluation today patient's wound is showing signs of  improvement. This is a small improvement but nonetheless we seem to be making progress here. I am very pleased in that regard. There does not appear to be any signs of infection and overall things are doing quite nicely. Upon inspection patient's wound bed actually showed signs of good granulation and epithelization at this point. There was some irritation around the edges of the wound where the good skin was it almost appears that something was rubbing or else is was due from drainage and irritation. Nonetheless I am going to see about putting a little bit of zinc just around the wound opening in order to protect the skin from being damaged. The patient is in agreement with that plan. 5/8; its been a while since I have seen this wound and it looks considerably better although still with some depth. Per our intake nurse the dimensions have improved patient has been using a thin layer of clobetasol, Hydrofera Blue under 4-layer compression. He appears to be making progress 10-01-2021 upon evaluation today patient appears to be doing well with regard to his wound although it is somewhat stalled I feel like were basically at a standstill here. He has previously had Apligraf which did very well for him to be honest. I do believe that he may benefit from a repeat of the Apligraf. In fact it got so small at that point but I do not think we even used all of the applications that were recommended as this had improved to such a degree. Nonetheless I do believe that the patient currently could benefit from repeat treatment with the Apligraf due to the improvement this that we did see. He is not opposed to this and subsequently working to look into getting approval to reinstitute that treatment. 10-08-2021 upon evaluation today patient appears to be doing well with regard to his wound. Overall I feel like he is doing a little bit better from the standpoint of irritation at this time which is great news. I do not  see any signs of active infection locally or systemically which is great news as well. No fevers, chills, nausea, vomiting, or diarrhea. 10-12-2021 upon evaluation today patient appears to be doing well with regard to his wound stable although we are can initiate treatment with Apligraf today. I am hoping this will stimulate things to improve. Patient is in agreement with that plan. 10-19-2028 upon evaluation today patient appears to be doing well with regard to his wound. He has been tolerating the dressing changes without complication. Fortunately there does not appear to be any evidence of active infection locally or systemically at this time which is great news. No fevers, chills, nausea, vomiting, or diarrhea. I do believe the Apligraf has improved the overall appearance of the wound bed receiving some definite improvements here. 10-26-2021 upon evaluation today patient's wound is actually shown some signs of really good improvement I am very pleased in this regard. There is a little bit of slough and biofilm buildup that is stuck to the surface of the wound I am getting very lightly debrided in order to clear this away and then subsequently will get a continue with the Apligraf application today. This is #3. 11-09-2021 upon evaluation today patient appears to be doing well currently in regard to his wound. This is actually showing  signs of significant improvement which is great news and overall I think the Apligraf is doing an amazing job. This wound is significantly smaller compared to the last time I saw him. This will be Apligraf application #4. 11-25-2954 upon evaluation today patient appears to be doing well currently in regard to his wound he is actually showing signs of excellent improvement which is great news and overall I am extremely pleased with where we stand today. I do not see any evidence of active infection locally or systemically at this time which is great news. No fevers, chills,  nausea, vomiting, or diarrhea. I believe the Apligraf is doing an awesome job and in fact I [place] see about getting an extension of application which hopefully should not be a problem. Especially in light of how much improvement he has had with regards to the size of the wound with treatment. This is application #5 today. 12-07-2021 upon evaluation today patient appears to be doing excellent in regard to his wound has been tolerating the dressing changes without complication. Fortunately there does not appear to be any signs of active infection locally or systemically at this time. 12-21-2021 upon evaluation today patient appears to be doing well currently in regard to his wound. We have gotten approval for a repeat Apligraf treatment. I think that is going to be a good way to wrap this up I really feel like we will likely be able to get the wound closed with the additional [name], Aryaan ([id number]) treatments that have been improved. 01-04-2022 upon evaluation patient appears to be doing excellent in regard to his wound. He is tolerating the dressing changes without complication and is measuring much better today as well. I am very pleased with where we stand. I am not even certain we will get have to use all 5 of the reapproved Apligraf but we will see how things go right now I am extremely pleased with the progress that is been made. 01-18-2022 upon evaluation today patient appears to be doing well with regard to his wound is actually measuring significantly better which is great news. This is still open but very minimally. I do think that it is appropriate to continue with the Apligraf he has done so well with this. 02-01-2022 upon evaluation today patient actually appears to be doing well currently with regard to his wound in fact this appears to be pretty much completely healed. There is just a very pinpoint area that is leaking a little clear fluid otherwise this is doing quite well. I do not see  any evidence of active infection at this time. 02-08-2022 upon evaluation today patient appears to be doing well currently in regard to his wound in fact this is showing signs of complete resolution as of today. 02-15-2022 upon evaluation today patient continues to do well there is really no signs of active infection at this time. He is tolerating the dressing changes without complication and to be honest still appears to be closed which is absolutely awesome. I am hopeful we will be able to discharge him next week without any complication. Electronic Signature(s) Signed: 02/15/2022 8:57:45 AM By: Worthy Keeler PA-C Entered By: Worthy Keeler on 02/15/2022 08:57:44 STEEL, KERNEY ([id number]) -------------------------------------------------------------------------------- Physical Exam Details Patient Name: [name] Date of Service: 02/15/2022 8:15 AM Medical Record Number: [id number] Patient Account Number: [id number] Date of Birth/Sex: August 08, 1980 (41 y.o. M) Treating RN: Carlene Coria Primary Care Provider: [name] Other Clinician: Massie Kluver Referring Provider: [name] Treating  Provider/Extender: Jeri Cos Weeks in Treatment: 78 Constitutional Well-nourished and well-hydrated in no acute distress. Respiratory normal breathing without difficulty. Psychiatric this patient is able to make decisions and demonstrates good insight into disease process. Alert and Oriented x 3. pleasant and cooperative. Notes Patient's wound again showed complete epithelization there was a little bit of dry skin here and there I was able to carefully remove there is nothing open underneath it overall I feel like he is very close to discharge I would recommend next week will probably be the discharge today he will have his compression sock with him and then we will go from there. Electronic Signature(s) Signed: 02/15/2022 8:58:04 AM By: Worthy Keeler PA-C Entered By: Worthy Keeler on 02/15/2022 08:58:04 ROLLINS, WRIGHTSON ([id number]) -------------------------------------------------------------------------------- Physician Orders Details Patient Name: [name] Date of Service: 02/15/2022 8:15 AM Medical Record Number: [id number] Patient Account Number: [id number] Date of Birth/Sex: 1980/08/06 (41 y.o. M) Treating RN: Carlene Coria Primary Care Provider: [name] Other Clinician: Massie Kluver Referring Provider: [name] Treating Provider/Extender: [name] Cliche in Treatment: [phone] Verbal / Phone Orders: No Diagnosis Coding ICD-10 Coding Code Description I87.2 Venous insufficiency (chronic) (peripheral) L97.222 Non-pressure chronic ulcer of left calf with fat layer exposed E11.622 Type 2 diabetes mellitus with other skin ulcer L88 Pyoderma gangrenosum F17.208 Nicotine dependence, unspecified, with other nicotine-induced disorders Follow-up Appointments o Return Appointment in 1 week. Bathing/ Shower/ Hygiene o Clean wound with Normal Saline or wound cleanser. o May shower with wound dressing protected with water repellent cover or cast protector. o No tub bath. Edema Control - Lymphedema / Segmental Compressive Device / Other o Optional: One layer of unna paste to top of compression wrap (to act as an anchor). - He needs this to hold it up o 4 Layer Compression System Lymphedema. o Elevate, Exercise Daily and Avoid Standing for Long Periods of Time. o Elevate legs to the level of the heart and pump ankles as often as possible o Elevate leg(s) parallel to the floor when sitting. Medications-Please add to medication list. o Other: - clobetasol to area, then Mepitel, steristrips, bolus Electronic Signature(s) Signed: 02/15/2022 11:37:09 AM By: Worthy Keeler PA-C Signed: 02/15/2022 12:10:36 PM By: Massie Kluver Entered By: Massie Kluver on 02/15/2022 08:25:59 Lau, [name]  ([id number]) -------------------------------------------------------------------------------- Problem List Details Patient Name: [name] Date of Service: 02/15/2022 8:15 AM Medical Record Number: [id number] Patient Account Number: [id number] Date of Birth/Sex: December 18, 1980 (41 y.o. M) Treating RN: Carlene Coria Primary Care Provider: [name] Other Clinician: Massie Kluver Referring Provider: [name] Treating Provider/Extender: [name] Cliche in Treatment: 271 Active Problems ICD-10 Encounter Code Description Active Date MDM Diagnosis I87.2 Venous insufficiency (chronic) (peripheral) 12/04/2016 No Yes L97.222 Non-pressure chronic ulcer of left calf with fat layer exposed 12/04/2016 No Yes E11.622 Type 2 diabetes mellitus with other skin ulcer 04/09/2021 No Yes L88 Pyoderma gangrenosum 03/26/2017 No Yes F17.208 Nicotine dependence, unspecified, with other nicotine-induced disorders 04/24/2020 No Yes Inactive Problems ICD-10 Code Description Active Date Inactive Date L97.213 Non-pressure chronic ulcer of right calf with necrosis of muscle 04/02/2017 04/02/2017 Resolved Problems ICD-10 Code Description Active Date Resolved Date L97.321 Non-pressure chronic ulcer of left ankle limited to breakdown of skin 10/08/2019 10/08/2019 L03.116 Cellulitis of left lower limb 05/24/2019 05/24/2019 Electronic Signature(s) Signed: 02/15/2022 8:02:35 AM By: Worthy Keeler PA-C Entered By: Worthy Keeler on 02/15/2022 08:02:35 [name] ([id number]) -------------------------------------------------------------------------------- Progress Note Details Patient Name: [name] Date of Service: 02/15/2022 8:15 AM Medical Record Number:  [id number] Patient Account Number: [id number] Date of Birth/Sex: December 21, 1980 (41 y.o. M) Treating RN: Carlene Coria Primary Care Provider: [name] Other Clinician: Massie Kluver Referring Provider: [name] Treating Provider/Extender:  [name] Cliche in Treatment: 81 Subjective Chief Complaint Information obtained from Patient He is here in follow up evaluation for LLE pyoderma ulcer History of Present Illness (HPI) 12/04/16; 41 year old man who comes into the clinic today for review of a wound on the posterior left calf. He tells me that is been there for about a year. He is not a diabetic he does smoke half a pack per day. He was seen in the ER on 11/20/16 felt to have cellulitis around the wound and was given clindamycin. An x-ray did not show osteomyelitis. The patient initially tells me that he has a milk allergy that sets off a pruritic itching rash on his lower legs which she scratches incessantly and he thinks that's what may have set up the wound. He has been using various topical antibiotics and ointments without any effect. He works in a [employer]ing [employer] and is on his feet all day. He does not have a prior history of wounds however he does have the rash on both lower legs the right arm and the ventral aspect of his left arm. These are excoriations and clearly have had scratching however there are of macular looking areas on both legs including a substantial larger area on the right leg. This does not have an underlying open area. There is no blistering. The patient tells me that 2 years ago in [place] in response to the rash on his legs he saw a dermatologist who told him he had a condition which may be pyoderma gangrenosum although I may be putting words into his mouth. He seemed to recognize this. On further questioning he admits to a 5 year history of quiesced. ulcerative colitis. He is not in any treatment for this. He's had no recent travel 12/11/16; the patient arrives today with his wound and roughly the same condition we've been using silver alginate this is a deep punched out wound with some surrounding erythema but no tenderness. Biopsy I did did not show confirmed pyoderma gangrenosum suggested  nonspecific inflammation and vasculitis but does not provide an actual description of what was seen by the pathologist. I'm really not able to understand this We have also received information from the patient's dermatologist in [place] notes from April 2016. This was a doctor Agarwal-antal. 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 He was prescribed topical steroid high potency under occlusion which helped but at this point the patient did not have a deep punched out wound. 12/18/16; the patient's wound is larger in terms of surface area however this surface looks better and there is less depth. The surrounding erythema also is better. The patient states that the wrap we put on came off 2 days ago when he has been using his compression stockings. He we are in the process of getting a dermatology consult. 12/26/16 on evaluation today patient's left lower extremity wound shows evidence of infection with surrounding erythema noted. He has been tolerating the dressing changes but states that he has noted more discomfort. There is a larger area of erythema surrounding the wound. No fevers, chills, nausea, or vomiting noted at this time. With that being said the wound still does have slough covering the surface. He is not allergic to any medication that he is aware of at this point. In regard to  his right lower extremity he had several regions that are erythematous and pruritic he wonders if there's anything we can do to help that. 01/02/17 I reviewed patient's wound culture which was obtained his visit last week. He was placed on doxycycline at that point. Unfortunately that does not appear to be an antibiotic that would likely help with the situation however the pseudomonas noted on culture is sensitive to Cipro. Also unfortunately patient's wound seems to have a large compared to last week's evaluation. Not severely so but there are definitely increased measurements in general. He is  continuing to have discomfort as well he writes this to be a seven out of 10. In fact he would prefer me not to perform any debridement today due to the fact that he is having discomfort and considering he has an active infection on the little reluctant to do so anyway. No fevers, chills, nausea, or vomiting noted at this time. 01/08/17; patient seems dermatology on September 5. I suspect dermatology will want the slides from the biopsy I did sent to their pathologist. I'm not sure if there is a way we can expedite that. In any case the culture I did before I left on vacation 3 weeks ago showed Pseudomonas he was given 10 days of Cipro and per her description of her intake nurses is actually somewhat better this week although the wound is quite a bit bigger than I remember the last time I saw this. He still has 3 more days of Cipro 01/21/17; dermatology appointment tomorrow. He has completed the ciprofloxacin for Pseudomonas. Surface of the wound looks better however he is had some deterioration in the lesions on his right leg. Meantime the left lateral leg wound we will continue with sample 01/29/17; patient had his dermatology appointment but I can't yet see that note. He is completed his antibiotics. The wound is more superficial but considerably larger in circumferential area than when he came in. This is in his left lateral calf. He also has swollen erythematous areas with superficial wounds on the right leg and small papular areas on both arms. There apparently areas in her his upper thighs and buttocks I did not look at those. Dermatology biopsied the right leg. Hopefully will have their input next week. 02/05/17; patient went back to see his dermatologist who told him that he had a "scratching problem" as well as staph. He is now on a 30 day course of doxycycline and I believe she gave him triamcinolone cream to the right leg areas to help with the itching [not exactly sure but  probably triamcinolone]. She apparently looked at the left lateral leg wound although this was not rebiopsied and I think felt to be ultimately part of the same pathogenesis. He is using sample border foam and changing nevus himself. He now has a new open area on the right posterior leg which was his biopsy site I don't have any of the dermatology notes 02/12/17; we put the patient in compression last week with SANTYL to the wound on the left leg and the biopsy. Edema is much better and the depth of the wound is now at level of skin. Area is still the same Biopsy site on the right lateral leg we've also been using santyl with a border foam dressing and he is changing this himself. 02/19/17; Using silver alginate started last week to both the substantial left leg wound and the biopsy site on the right wound. He is tolerating compression well. Has a  an appointment with his primary M.D. tomorrow wondering about diuretics although I'm wondering if the edema problem is actually lymphedema RAYCE, BRAHMBHATT ([id number]) 02/26/17; the patient has been to see his primary doctor Dr. Jerrel Ivory at [facility] our primary care. She started him on Lasix 20 mg and this seems to have helped with the edema. However we are not making substantial change with the left lateral calf wound and inflammation. The biopsy site on the right leg also looks stable but not really all that different. 03/12/17; the patient has been to see vein and vascular Dr. Lucky Cowboy. He has had venous reflux studies I have not reviewed these. I did get a call from his dermatology office. 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 They therefore recommended clobetasol and tetracycline i.e.  aggressive treatment for possible pyoderma gangrenosum. 03/26/17; apparently the patient just had reflux studies not an appointment with Dr. dew. She arrives in clinic today having applied clobetasol for 2-3 weeks. He notes over the last 2-3 days excessive drainage having to change the dressing 3-4 times a day and also expanding erythema. 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 Lillia Carmel at [facility] dermatology locally who kindly saw him at my request. 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 My concern was the degree of erythema medially extending from his left leg wound which was either inflammation from pyoderma or cellulitis. I put him on Augmentin however culture of the wound showed Pseudomonas which is quinolone sensitive. I really don't believe he has cellulitis however in view of everything I will continue and give him a course of Cipro. He is also on doxycycline as an immune modulator for the pyoderma. In addition to his original wound on the left lateral leg with surrounding erythema he has a wound on the right posterior calf which was an original biopsy site done by dermatology. This was felt to represent pathergy from pyoderma gangrenosum 04/16/17; pyoderma gangrenosum. Saw Dr. Lillia Carmel yesterday. He has been using topical antibiotics to both wound areas his original wound on the left and the biopsies/pathergy area on the right. There is definitely some improvement in the inflammation around the wound on the right although the patient states he has increasing sensitivity of the wounds. He is on prednisone 60 and doxycycline 1 as prescribed by Dr. Lillia Carmel. He is covering  the topical antibiotic with gauze and putting this in his own compression stocks and changing this daily. He states that Dr. Lottie Rater did a culture of the left leg wound yesterday 05/07/17; pyoderma gangrenosum. The patient saw Dr. Lillia Carmel yesterday and has a follow-up with her in one month. He is still using topical antibiotics to both wounds although he can't recall exactly what type. He is still on prednisone 60 mg. Dr. Lillia Carmel stated that the doxycycline could stop if we were in agreement. He has been using his own compression stocks changing daily 06/11/17; pyoderma gangrenosum with  wounds on the left lateral leg and right medial leg. The right medial leg was induced by biopsy/pathergy. The area on the right is essentially healed. Still on high-dose prednisone using topical antibiotics to the wound 07/09/17; pyoderma gangrenosum with wounds on the left lateral leg. The right medial leg has closed and remains closed. He is still on prednisone 60. He tells me he missed his last dermatology appointment with Dr. Lillia Carmel but will make another appointment. He reports that her blood sugar at a recent screen in [place] was high 200's. He was 180 today. 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 I think he is going to require still much longer prednisone perhaps another 3 months before attempting to taper. In the meantime his wound is a lot better. Smaller. He is cleaning this off daily and applying topical antibiotics. When he was last in the clinic I thought about changing to [facility] and actually put in a couple of calls to dermatology although probably not during their business hours. In any case the wound looks better smaller I don't think there is any need to change what he is doing 08/06/17-he is here in follow up evaluation for pyoderma left leg ulcer. He continues on oral prednisone. He has been using triple antibiotic ointment. There is surface debris and we will transition to  [facility] and have him return in 2 weeks. He has lost 30 pounds since his last appointment with lifestyle modification. He may benefit from topical steroid cream for treatment this can be considered at a later date. 08/22/17 on evaluation today patient appears to actually be doing rather well in regard to his left lateral lower extremity ulcer. He has actually been managed by Dr. Dellia Nims most recently. Patient is currently on oral steroids at this time. This seems to have been of benefit for him. Nonetheless his last visit was actually with Leah on 08/06/17. Currently he is not utilizing any topical steroid creams although this could be of benefit as well. No fevers, chills, nausea, or vomiting noted at this time. 09/05/17 on evaluation today patient appears to be doing better in regard to his left lateral lower extremity ulcer. He has been tolerating the dressing changes without complication. He is using Santyl with good effect. Overall I'm very pleased with how things are standing at this point. Patient likewise is happy that this is doing better. 09/19/17 on evaluation today patient actually appears to be doing rather well in regard to his left lateral lower extremity ulcer. Again this is secondary to Pyoderma gangrenosum and he seems to be progressing well with the Santyl which is good news. He's not having any significant pain. 10/03/17 on evaluation today patient appears to be doing excellent in regard to his lower extremity wound on the left secondary to Pyoderma gangrenosum. He has been tolerating the Santyl without complication and in general I feel like he's making good progress. 10/17/17 on evaluation today patient appears to be doing very well in regard to his left lateral lower surety ulcer. He has been tolerating the dressing changes without complication. There does not appear to be any evidence of infection he's alternating the Santyl and the triple antibiotic ointment every other day this seems  to be doing well for him. 11/03/17 on evaluation today patient appears to be doing very well in regard to his left lateral lower extremity ulcer. He is been tolerating the dressing changes without complication which is good news. Fortunately there does not appear to be  any evidence of infection which is also great news. Overall is doing excellent they are starting to taper down on the prednisone is down to 40 mg at this point it also started topical clobetasol for him. 11/17/17 on evaluation today patient appears to be doing well in regard to his left lateral lower surety ulcer. He's been tolerating the dressing changes without complication. He does note that he is having no pain, no excessive drainage or discharge, and overall he feels like things are going about how he would expect and hope they would. Overall he seems to have no evidence of infection at this time in my opinion which is good news. 12/04/17-He is seen in follow-up evaluation for right lateral lower extremity ulcer. He has been applying topical steroid cream. Today's measurement show slight increase in size. Over the next 2 weeks we will transition to every other day Santyl and steroid cream. He has been encouraged to monitor for changes and notify clinic with any concerns 12/15/17 on evaluation today patient's left lateral motion the ulcer and fortunately is doing worse again at this point. This just since last week to this week has close to doubled in size according to the patient. I did not seeing last week's I do not have a visual to compare this to in our system was also down so we do not have all the charts and at this point. Nonetheless it does have me somewhat concerned in regard to the fact that again he was worried enough about it he has contact the dermatology that placed them back on the full strength, 50 mg a day of the prednisone that he was taken previous. He continues to alternate using clobetasol along with Santyl at this  point. He is obviously somewhat frustrated. HASSAAN, CRITE ([id number]) 12/22/17 on evaluation today patient appears to be doing a little worse compared to last evaluation. Unfortunately the wound is a little deeper and slightly larger than the last week's evaluation. With that being said he has made some progress in regard to the irritation surrounding at this time unfortunately despite that progress that's been made he still has a significant issue going on here. 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 For that reason I discussed with him today the possibility of perform a wound culture to ensure there's nothing overtly infected. 01/06/18 on evaluation today patient's wound is larger and deeper than previously evaluated. With that being said it did appear that his wound was infected after my last evaluation with him. Subsequently I did end up prescribing a prescription for Bactrim DS which she has been taking and having no complication with. Fortunately there does not appear to be any evidence of infection at this point in time as far as anything spreading, no want to touch, and overall I feel like things are showing signs of improvement. 01/13/18 on evaluation today patient appears to be even a little larger and deeper than last time. There still muscle exposed in the base of the wound. Nonetheless he does appear to be less erythematous I do believe inflammation is calming down also believe the infection looks like it's probably resolved at this time based on what I'm seeing. No fevers, chills, nausea, or vomiting noted at this time. 01/30/18 on evaluation today patient actually appears to visually look better for the most part. Unfortunately those visually this looks  better he does seem to potentially have what may be an abscess in the muscle that has  been noted in the central portion of the wound. This is the first time that I have noted what appears to be fluctuance in the central portion of the muscle. With that being said I'm somewhat more concerned about the fact that this might indicate an abscess formation at this location. I do believe that an ultrasound would be appropriate. This is likely something we need to try to do as soon as possible. He has been switch to mupirocin ointment and he is no longer using the steroid ointment as prescribed by dermatology he sees them again next week he's been decreased from 60 to 40 mg of prednisone. 03/09/18 on evaluation today patient actually appears to be doing a little better compared to last time I saw him. There's not as much erythema surrounding the wound itself. He I did review his most recent infectious disease note which was dated 02/24/18. He saw Dr. Michel Bickers in [place]. 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 Patient is now off of antibiotics and they are continually observing this. There seems to be no change in the past two weeks in my pinion based on what the patient says and what I see today compared to what Dr. Megan Salon likely saw two weeks ago. No fevers, chills, nausea, or vomiting noted at this time. 03/23/18 on evaluation today patient's wound actually appears to be showing signs of improvement which is good news. He is currently still on the Dapsone. He is also working on tapering the prednisone to get off of this and Dr. Lottie Rater is working with him in this regard. Nonetheless overall I feel like the wound is doing well it does appear based on the infectious disease note that I reviewed from Dr. 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 I did also review the results of  his ultrasound of left lower extremity which revealed there was a dentist tissue in the base of the wound without an abscess noted. 04/06/18 on evaluation today the patient's left lateral lower extremity ulcer actually appears to be doing fairly well which is excellent news. There does not appear to be any evidence of infection at this time which is also great news. Overall he still does have a significantly large ulceration although little by little he seems to be making progress. He is down to 10 mg a day of the prednisone. 04/20/18 on evaluation today patient actually appears to be doing excellent at this time in regard to his left lower extremity ulcer. He's making signs of good progress unfortunately this is taking much longer than we would really like to see but nonetheless he is making progress. Fortunately there does not appear to be any evidence of infection at this time. No fevers, chills, nausea, or vomiting noted at this time. The patient has not been using the Santyl due to the cost he hadn't got in this field yet. He's mainly been using the antibiotic ointment topically. Subsequently he also tells me that he really has not been scrubbing in the shower I think this would be helpful again as I told him it doesn't have to be anything too aggressive to even make it believe just enough  to keep it free of some of the loose slough and biofilm on the wound surface. 05/11/18 on evaluation today patient's wound appears to be making slow but sure progress in regard to the left lateral lower extremity ulcer. He is been tolerating the dressing changes without complication. Fortunately there does not appear to be any evidence of infection at this time. He is still just using triple antibiotic ointment along with clobetasol occasionally over the area. He never got the Santyl and really does not seem to intend to in my pinion. 06/01/18 on evaluation today patient appears to be doing a little better in  regard to his left lateral lower extremity ulcer. He states that overall he does not feel like he is doing as well with the Dapsone as he did with the prednisone. Nonetheless he sees his dermatologist later today and is gonna talk to them about the possibility of going back on the prednisone. Overall again I believe that the wound would be better if you would utilize Santyl but he really does not seem to be interested in going back to the [place] at this point. He has been using triple antibiotic ointment. 06/15/18 on evaluation today patient's wound actually appears to be doing about the same at this point. Fortunately there is no signs of infection at this time. He has made slight improvements although he continues to not really want to clean the wound bed at this point. He states that he just doesn't mess with it he doesn't want to cause any problems with everything else he has going on. He has been on medication, antibiotics as prescribed by his dermatologist, for a staff infection of his lower extremities which is really drying out now and looking much better he tells me. Fortunately there is no sign of overall infection. 06/29/18 on evaluation today patient appears to be doing well in regard to his left lateral lower surety ulcer all things considering. Fortunately his staff infection seems to be greatly improved compared to previous. He has no signs of infection and this is drying up quite nicely. He is still the doxycycline for this is no longer on cental, Dapsone, or any of the other medications. His dermatologist has recommended possibility of an infusion but right now he does not want to proceed with that. 07/13/18 on evaluation today patient appears to be doing about the same in regard to his left lateral lower surety ulcer. Fortunately there's no signs of infection at this time which is great news. Unfortunately he still builds up a significant amount of Slough/biofilm of the surface of  the wound he still is not really cleaning this as he should be appropriately. Again I'm able to easily with saline and gauze remove the majority of this on the surface which if you would do this at home would likely be a dramatic improvement for him as far as getting the area to improve. Nonetheless overall I still feel like he is making progress is just very slow. I think Santyl will be of benefit for him as well. Still he has not gotten this as of this point. 07/27/18 on evaluation today patient actually appears to be doing little worse in regards of the erythema around the periwound region of the wound he also tells me that he's been having more drainage currently compared to what he was experiencing last time I saw him. He states not quite as bad as what he had because this was infected previously but nonetheless is still appears  to be doing poorly. Fortunately there is no evidence of systemic infection at this point. The patient tells me that he is not going to be able to afford the Santyl. He is still waiting to hear about the infusion therapy with his dermatologist. Apparently she wants an updated colonoscopy first. GYASI, HAZZARD ([id number]) 08/10/18 on evaluation today patient appears to be doing better in regard to his left lateral lower extremity ulcer. Fortunately he is showing signs of improvement in this regard he's actually been approved for Remicade infusion's as well although this has not been scheduled as of yet. Fortunately there's no signs of active infection at this time in regard to the wound although he is having some issues with infection of the right lower extremity is been seen as dermatologist for this. Fortunately they are definitely still working with him trying to keep things under control. 09/07/18 on evaluation today patient is actually doing rather well in regard to his left lateral lower extremity ulcer. He notes these actually having some hair grow back on his extremity  which is something he has not seen in years. He also tells me that the pain is really not giving them any trouble at this time which is also good news overall she is very pleased with the progress he's using a combination of the mupirocin along with the probate is all mixed. 09/21/18 on evaluation today patient actually appears to be doing fairly well all things considered in regard to his looks from the ulcer. He's been tolerating the dressing changes without complication. Fortunately there's no signs of active infection at this time which is good news he is still on all antibiotics or prevention of the staff infection. He has been on prednisone for time although he states it is gonna contact his dermatologist and see if she put them on a short course due to some irritation that he has going on currently. Fortunately there's no evidence of any overall worsening this is going very slow I think cental would be something that would be helpful for him although he states that $50 for tube is quite expensive. He therefore is not willing to get that at this point. 10/06/18 on evaluation today patient actually appears to be doing decently well in regard to his left lateral leg ulcer. He's been tolerating the dressing changes without complication. Fortunately there's no signs of active infection at this time. Overall I'm actually rather pleased with the progress he's making although it's slow he doesn't show any signs of infection and he does seem to be making some improvement. I do believe that he may need a switch up and dressings to try to help this to heal more appropriately and quickly. 10/19/18 on evaluation today patient actually appears to be doing better in regard to his left lateral lower extremity ulcer. This is shown signs of having much less Slough buildup at this point due to the fact he has been using the [employer]. Obviously this is very good news. The overall size of the wound is not dramatically  smaller but again the appearance is. 11/02/18 on evaluation today patient actually appears to be doing quite well in regard to his lower [place] ulcer. A lot of the skin around the ulcer is actually somewhat irritating at this point this seems to be more due to the dressing causing irritation from the adhesive that anything else. Fortunately there is no signs of active infection at this time. 11/24/18 on evaluation today patient appears to be doing  a little worse in regard to his overall appearance of his lower extremity ulcer. There's more erythema and warmth around the wound unfortunately. He is currently on doxycycline which he has been on for some time. With that being said I'm not sure that seems to be helping with what appears to possibly be an acute cellulitis with regard to his left lower extremity ulcer. No fevers, chills, nausea, or vomiting noted at this time. 12/08/18 on evaluation today patient's wounds actually appears to be doing significantly better compared to his last evaluation. He has been using Santyl along with alternating tripling about appointment as well as the steroid cream seems to be doing quite well and the wound is showing signs of improvement which is excellent news. Fortunately there's no evidence of infection and in fact his culture came back negative with only normal skin flora noted. 12/21/2018 upon evaluation today patient actually appears to be doing excellent with regard to his ulcer. This is actually the best that I have seen it since have been helping to take care of him. It is both smaller as well as less slough noted on the surface of the wound and seems to be showing signs of good improvement with new skin growing from the edges. He has been using just the triamcinolone he does wonder if he can get a refill of that ointment today. 01/04/2019 upon evaluation today patient actually appears to be doing well with regard to his left lateral lower extremity ulcer. With  that being said it does not appear to be that he is doing quite as well as last time as far as progression is concerned. There does not appear to be any signs of infection or significant irritation which is good news. With that being said I do believe that he may benefit from switching to a collagen based dressing based on how clean The wound appears. 01/18/2019 on evaluation today patient actually appears to be doing well with regard to his wound on the left lower extremity. He is not made a lot of progress compared to where we were previous but nonetheless does seem to be doing okay at this time which is good news. There is no signs of active infection which is also good news. My only concern currently is I do wish we can get him into utilizing the collagen dressing his insurance would not pay for the supplies that we ordered although it appears that he may be able to order this through his supply company that he typically utilizes. This is [employer]. Nonetheless he did try to order it during the office visit today and it appears this did go through. We will see if he can get that it is a different brand but nonetheless he has collagen and I do think will be beneficial. 02/01/2019 on evaluation today patient actually appears to be doing a little worse today in regard to the overall size of his wounds. Fortunately there is no signs of active infection at this time. That is visually. Nonetheless when this is happened before it was due to infection. For that reason were somewhat concerned about that this time as well. 02/08/2019 on evaluation today patient unfortunately appears to be doing slightly worse with regard to his wound upon evaluation today. Is measuring a little deeper and a little larger unfortunately. I am not really sure exactly what is causing this to enlarge he actually did see his dermatologist she is going to see about initiating Humira for him. Subsequently she  also did do steroid  injections into the wound itself in the periphery. Nonetheless still nonetheless he seems to be getting a little bit larger he is gone back to just using the steroid cream topically which I think is appropriate. I would say hold off on the collagen for the time being is definitely a good thing to do. Based on the culture results which we finally did get the final result back regarding it shows staph as the bacteria noted again that can be a normal skin bacteria based on the fact however he is having increased drainage and worsening of the wound measurement wise I would go ahead and place him on an antibiotic today I do believe for this. 02/15/2019 on evaluation today patient actually appears to be doing somewhat better in regard to his ulcer. There is no signs of worsening at this time I did review his culture results which showed evidence of Staphylococcus aureus but not MRSA. Again this could just be more related to the normal skin bacteria although he states the drainage has slowed down quite a bit he may have had a mild infection not just colonization. And was much smaller and then since around10/04/2019 on evaluation today patient appears to be doing unfortunately worse as far as the size of the wound. I really feel like that this is steadily getting larger again it had been doing excellent right at the beginning of September we have seen a steady increase in the area of the wound it is almost 2-1/2 times the size it was on September 1. Obviously this is a bad trend this is not wanting to see. For that reason we went back to using just the topical triamcinolone cream which does seem to help with inflammation. I checked him for bacteria by way of culture and nothing showed positive there. I am considering giving him a short course of a tapering steroid Benito Lemmerman, Bowie ([id number]) today to see if that is can be beneficial for him. The patient is in agreement with giving that a try. 03/08/2019  on evaluation today patient appears to be doing very well in comparison to last evaluation with regard to his lower extremity ulcer. This is showing signs of less inflammation and actually measuring slightly smaller compared to last time every other week over the past month and a half he has been measuring larger larger larger. Nonetheless I do believe that the issue has been inflammation the prednisone does seem to have been beneficial for him which is good news. No fevers, chills, nausea, vomiting, or diarrhea. 03/22/2019 on evaluation today patient appears to be doing about the same with regard to his leg ulcer. He has been tolerating the dressing changes without complication. With that being said the wound seems to be mostly arrested at its current size but really is not making any progress except for when we prescribed the prednisone. He did show some signs of dropping as far as the overall size of the wound during that interval week. Nonetheless this is something he is not on long-term at this point and unfortunately I think he is getting need either this or else the Humira which his dermatologist has discussed try to get approval for. With that being said he will be seeing his dermatologist on the 11th of this month that is November. 04/19/2019 on evaluation today patient appears to be doing really about the same the wound is measuring slightly larger compared to last time I saw him. He has not  been into the office since November 2 due to the fact that he unfortunately had Covid as that his entire family. He tells me that it was rough but they did pull-through and he seems to be doing much better. Fortunately there is no signs of active infection at this time. No fevers, chills, nausea, vomiting, or diarrhea. 05/10/2019 on evaluation today patient unfortunately appears to be doing significantly worse as compared to last time I saw him. He does tell me that he has had his first dose of Humira and  actually is scheduled to get the next one in the upcoming week. With that being said he tells me also that in the past several days he has been having a lot of issues with green drainage she showed me a picture this is more blue-green in color. He is also been having issues with increased sloughy buildup and the wound does appear to be larger today. Obviously this is not the direction that we want everything to take based on the starting of his Humira. Nonetheless I think this is definitely a result of likely infection and to be honest I think this is probably Pseudomonas causing the infection based on what I am seeing. 05/24/2019 on evaluation today patient unfortunately appears to be doing significantly worse compared to his prior evaluation with me 2 weeks ago. I did review his culture results which showed that he does have Staph aureus as well as Pseudomonas noted on the culture. Nonetheless the Levaquin that I prescribed for him does not appear to have been appropriate and in fact he tells me he is no longer experiencing the green drainage and discharge that he had at the last visit. Fortunately there is no signs of active infection at this time which is good news although the wound has significantly worsened it in fact is much deeper than it was previous. We have been utilizing up to this point triamcinolone ointment as the prescription topical of choice but at this time I really feel like that the wound is getting need to be packed in order to appropriately manage this due to the deeper nature of the wound. Therefore something along the lines of an alginate dressing may be more appropriate. 05/31/2019 upon inspection today patient's wound actually showed signs of doing poorly at this point. Unfortunately he just does not seem to be making any good progress despite what we have tried. He actually did go ahead and pick up the Cipro and start taking that as he was noticing more green drainage he had  previously completed the Levaquin that I prescribed for him as well. Nonetheless he missed his appointment for the seventh last week on Wednesday with the wound care center and [facility] where his dermatologist referred him. Obviously I do think a second opinion would be helpful at this point especially in light of the fact that the patient seems to be doing so poorly despite the fact that we have tried everything that I really know how at this point. The only thing that ever seems to have helped him in the past is when he was on high doses of continual steroids that did seem to make a difference for him. 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 06/07/2019 upon evaluation today patient unfortunately appears to be  doing worse yet again with regard to his wound. In fact I am starting to question whether or not he may have a fluid pocket in the muscle at this point based on the bulging and the soft appearance to the central portion of the muscle area. There is not anything draining from the muscle itself at this time which is good news but nonetheless the wound is expanding. I am not really seeing any results of the Humira as far as overall wound progression based on what I am seeing at this point. The patient has been referred for second opinion with regard to his wound to the [facility] wound care center by his dermatologist which I definitely am not in opposition to. Unfortunately we tried multiple dressings in the past including collagen, alginate, and at one point even Hydrofera Blue. With that being said he is never really used it for any significant amount of time due to the fact that he often complains of pain associated with these dressings and then will go back to either using the Santyl which she has done intermittently or more frequently  the triamcinolone. He is also using his own compression stockings. We have wrapped him in the past but again that was something else that he really was not a big fan of. 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 He seemed to do much better when he was on the steroids although honestly I understand that the steroids are not necessarily the best medication to be on long-term obviously 06/14/2019 on evaluation today patient appears to be doing actually a little bit better with regard to the overall appearance with his leg. Unfortunately he does continue to have issues with what appears to be some fluid underneath the muscle although he did see the wound specialty center at [facility] last week their main goals were to see about infusion therapy in place of the Humira as they feel like that is not quite strong enough. They also recommended that we continue with the treatment otherwise as we are they felt like that was appropriate and they are okay with him continuing to follow-up here with [place] in that regard. With that being said they are also sending him to the vein specialist there to see about vein stripping and if that would be of benefit for him. Subsequently they also did not really address whether or not an ultrasound of the muscle area to see if there is anything that needs to be addressed here would be appropriate or not. For that reason I discussed this with him last week I think we may proceed down that road at this point. 06/21/2019 upon evaluation today patient's wound actually appears to be doing slightly better compared to previous evaluations. I do believe that he has made a  difference with regard to the progression here with the use of oral steroids. Again in the past has been the only thing that is really calm things down. He does tell me that from [facility] is gotten a good news from there that there are no further vein stripping that is necessary at this point. I do not have that available  for review today although the patient did relay this to me. He also did obtain and have the ultrasound of the wound completed which I did sign off on today. It does appear that there is no fluid collection under the muscle this is likely then just edematous tissue in general. That is also good news. Overall I still believe the inflammation is the main issue here. He did inquire about the possibility of a wound VAC again with the muscle protruding like it is I am not really sure whether the wound VAC is necessarily ideal or not. That is something we will have to consider although I do believe he may need compression wrapping to try to help with edema control which could potentially be of benefit. 06/28/2019 on evaluation today patient appears to be doing slightly better measurement wise although this is not terribly smaller he least seems to be trending towards that direction. With that being said he still seems to have purulent drainage noted in the wound bed at this time. He has been on Levaquin followed by Cipro over the past month. Unfortunately he still seems to have some issues with active infection at this time. I did perform a culture last week in order to evaluate and see if indeed there was still anything going on. Subsequently the culture did come back Czerniak, Giulio ([id number]) showing Pseudomonas which is consistent with the drainage has been having which is blue-green in color. He also has had an odor that again was somewhat consistent with Pseudomonas as well. Long story short it appears that the culture showed an intermediate finding with regard to how well the Cipro will work  for the Pseudomonas infection. Subsequently being that he does not seem to be clearing up and at best what we are doing is just keeping this at [facility] I think he may need to see infectious disease to discuss IV antibiotic options. 07/05/2019 upon evaluation today patient appears to be doing okay in regard to his leg ulcer. He has been tolerating the dressing changes at this point without complication. Fortunately there is no signs of active infection at this time which is good news. No fevers, chills, nausea, vomiting, or diarrhea. With that being said he does have an appointment with infectious disease tomorrow and his primary care on Wednesday. Again the reason for the infectious disease referral was due to the fact that he did not seem to be fully resolving with the use of oral antibiotics and therefore we were thinking that IV antibiotic therapy may be necessary secondary to the fact that there was an intermediate finding for how effective the Cipro may be. Nonetheless again he has been having a lot of purulent and even green drainage. Fortunately right now that seems to have calmed down over the past week with the reinitiation of the oral antibiotic. Nonetheless we will see what Dr. Megan Salon has to say. 07/12/2019 upon evaluation today patient appears to be doing about the same at this point in regard to his left lower extremity ulcer. Fortunately there is no signs of active infection at this time which is good news I do believe the Levaquin has been beneficial I did review Dr. Hale Bogus note and to be honest I agree that the patient's leg does appear to be doing better currently. What we found in the past as he does not seem to really completely resolve he will stop the antibiotic and then subsequently things will revert back to having issues with blue-green drainage, increased  pain, and overall worsening in general. Obviously that is the reason I sent him back to infectious disease. 07/19/2019 upon  evaluation today patient appears to be doing roughly the same in size there is really no dramatic improvement. He has started back on the Levaquin at this point and though he seems to be doing okay he did still have a lot of blue/green drainage noted on evaluation today unfortunately. I think that this is still indicative more likely of a Pseudomonas infection as previously noted and again he does see Dr. Megan Salon in just a couple of days. I do not know that were really able to effectively clear this with just oral antibiotics alone based on what I am seeing currently. Nonetheless we are still continue to try to manage as best we can with regard to the patient and his wound. I do think the wrap was helpful in decreasing the edema which is excellent news. No fevers, chills, nausea, vomiting, or diarrhea. 07/26/2019 upon evaluation today patient appears to be doing slightly better with regard to the overall appearance of the muscle there is no dark discoloration centrally. Fortunately there is no signs of active infection at this time. No fevers, chills, nausea, vomiting, or diarrhea. Patient's wound bed currently the patient did have an appointment with Dr. Megan Salon at infectious disease last week. With that being said Dr. Megan Salon the patient states was still somewhat hesitant about put him on any IV antibiotics he wanted [place] to repeat cultures today and then see where things go going forward. He does look like Dr. Megan Salon because of some improvement the patient did have with the Levaquin wanted [place] to see about repeating cultures. If it indeed grows the Pseudomonas again then he recommended a possibility of considering a PICC line placement and IV antibiotic therapy. He plans to see the patient back in 1 to 2 weeks. 08/02/2019 upon evaluation today patient appears to be doing poorly with regard to his left lower extremity. We did get the results of his culture back it shows that he is still showing  evidence of Pseudomonas which is consistent with the purulent/blue-green drainage that he has currently. Subsequently the culture also shows that he now is showing resistance to the oral fluoroquinolones which is unfortunate as that was really the only thing to treat the infection prior. I do believe that he is looking like this is going require IV antibiotic therapy to get this under control. Fortunately there is no signs of systemic infection at this time which is good news. The patient does see Dr. Megan Salon tomorrow. 08/09/2019 upon evaluation today patient appears to be doing better with regard to his left lower extremity ulcer in regard to the overall appearance. He is currently on IV antibiotic therapy. As ordered by Dr. Megan Salon. Currently the patient is on ceftazidime which she is going to take for the next 2 weeks and then follow-up for 4 to 5-week appointment with Dr. Megan Salon. The patient started this this past Friday symptoms have not for a total of 3 days currently in full. 08/16/2019 upon evaluation today patient's wound actually does show muscle in the base of the wound but in general does appear to be much better as far as the overall evidence of infection is concerned. In fact I feel like this is for the most part cleared up he still on the IV antibiotics he has not completed the full course yet but I think he is doing much better which is excellent news. 08/23/2019  upon evaluation today patient appears to be doing about the same with regard to his wound at this point. He tells me that he still has pain unfortunately. Fortunately there is no evidence of systemic infection at this time which is great news. There is significant muscle protrusion. 09/13/19 upon evaluation today patient appears to be doing about the same in regard to his leg unfortunately. He still has a lot of drainage coming from the ulceration there is still muscle exposed. With that being said the patient's last wound  culture still showed an intermediate finding with regard to the Pseudomonas he still having the bluish/green drainage as well. Overall I do not know that the wound has completely cleared of infection at this point. Fortunately there is no signs of active infection systemically at this point which is good news. 09/20/2019 upon evaluation today patient's wound actually appears to be doing about the same based on what I am seeing currently. I do not see any signs of systemic infection he still does have evidence of some local infection and drainage. He did see Dr. Megan Salon last week and Dr. Megan Salon states that he probably does need a different IV antibiotic although he does not want to put him on this until the patient begins the Remicade infusion which is actually scheduled for about 10 days out from today on 13 May. Following that time Dr. Megan Salon is good to see him back and then will evaluate the feasibility of starting him on the IV antibiotic therapy once again at that point. I do not disagree with this plan I do believe as Dr. Megan Salon stated in his note that I reviewed today that the patient's issue is multifactorial with the pyoderma being 1 aspect of this that were hoping the Remicade will be helpful for her. In the meantime I think the gentamicin is, helping to keep things under decent okay control in regard to the ulcer. 09/27/2019 upon evaluation today patient appears to be doing about the same with regard to his wound still there is a lot of muscle exposure though he does have some hyper granulation tissue noted around the edge and actually some granulation tissue starting to form over the muscle which is actually good news. Fortunately there is no evidence of active infection which is also good news. His pain is less at this point. 5/21; this is a patient I have not seen in a long time. 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 He has a large wound on the  left lateral leg with protruding muscle. He comes in the clinic today showing the same area on his left medial ankle. He says there is been a spot there for some time although we have not previously defined this. Today he has a clearly defined area with slight amount of skin breakdown surrounded by raised areas with a purplish hue in color. This is not painful he says it is irritated. 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 He still has muscle protruding from the lateral portion of his left leg but fortunately the new area that they were concerned about at his last visit does not appear to have opened at this point. He is currently on Remicade infusions and seems to be doing better in my opinion in fact the wound itself seems to be overall much better. The purplish discoloration that he did have seems to have  resolved and I think that is a good sign that hopefully the Remicade is doing its job. He does Mccabe, Clayborn ([id number]) have some biofilm noted over the surface of the wound. 11/01/2019 on evaluation today patient's wound actually appears to be doing excellent at this time. Fortunately there is no evidence of active infection and overall I feel like he is making great progress. The Remicade seems to be due excellent job in my opinion. 11/08/19 evaluation today vision actually appears to be doing quite well with regard to his weight ulcer. He's been tolerating dressing changes without complication. Fortunately there is no evidence of infection. No fevers, chills, nausea, or vomiting noted at this time. Overall states that is having more itching than pain which is actually a good sign in my opinion. 12/13/2019 upon evaluation today patient appears to be doing well today with regard to his wound. He has been tolerating the dressing changes without complication. Fortunately there is no sign  of active infection at this time. No fevers, chills, nausea, vomiting, or diarrhea. Overall I feel like the infusion therapy has been very beneficial for him. 01/06/2020 on evaluation today patient appears to be doing well with regard to his wound. This is measuring smaller and actually looks to be doing better. Fortunately there is no signs of active infection at this point. No fevers, chills, nausea, vomiting, or diarrhea. With that being said he does still have the blue-green drainage but this does not seem to be causing any significant issues currently. He has been using the gentamicin that does seem to be keeping things under decent control at this point. He goes later this morning for his next infusion therapy for the pyoderma which seems to also be very beneficial. 02/07/2020 on evaluation today patient appears to be doing about the same in regard to his wounds currently. Fortunately there is no signs of active infection systemically he does still have evidence of local infection still using gentamicin. He also is showing some signs of improvement albeit slowly I do feel like we are making some progress here. 02/21/2020 upon evaluation today patient appears to be making some signs of improvement the wound is measuring a little bit smaller which is great news and overall I am very pleased with where he stands currently. He is going to be having infusion therapy treatment on the 15th of this month. Fortunately there is no signs of active infection at this time. 03/13/2020 I do believe patient's wound is actually showing some signs of improvement here which is great news. He has continue with the infusion therapy through rheumatology[facility]dermatology at [facility]. That does seem to be beneficial. I still think he gets as much benefit from this as he did from the prednisone initially but nonetheless obviously this is less harsh on his body that the prednisone as far as they are concerned. 03/31/2020 on  evaluation today patient's wound actually showing signs of some pretty good improvement in regard to the overall appearance of the wound bed. There is still muscle exposed though he does have some epithelial growth around the edges of the wound. Fortunately there is no signs of active infection at this time. No fevers, chills, nausea, vomiting, or diarrhea. 04/24/2020 upon evaluation today patient appears to be doing about the same in regard to his leg ulcer. He has been tolerating the dressing changes without complication. Fortunately there is no signs of active infection at this time. No fevers, chills, nausea, vomiting, or diarrhea. With that being said he  still has a lot of irritation from the bandaging around the edges of the wound. We did discuss today the possibility of a referral to plastic surgery. 05/22/2020 on evaluation today patient appears to be doing well with regard to his wounds all things considered. He has not been able to get the Chantix apparently there is a recall nurse that I was unaware of put out by [employer] involuntarily. Nonetheless for now I am and I have to do some research into what may be the best option for him to help with quitting in regard to smoking and we discussed that today. 06/26/2020 upon evaluation today patient appears to be doing well with regard to his wound from the standpoint of infection I do not see any signs of infection at this point. With that being said unfortunately he is still continuing to have issues with muscle exposure and again he is not having a whole lot of new skin growth unfortunately. There does not appear to be any signs of active infection at this time. No fevers, chills, nausea, vomiting, or diarrhea. 07/10/2020 upon evaluation today patient appears to be doing a little bit more poorly currently compared to where he was previous. I am concerned currently about an active infection that may be getting worse especially in light of the increased  size and tenderness of the wound bed. No fevers, chills, nausea, vomiting, or diarrhea. 07/24/2020 upon evaluation today patient appears to be doing poorly in regard to his leg ulcer. He has been tolerating the dressing changes without complication but unfortunately is having a lot of discomfort. 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 Subsequently I think he is going require possibly IV antibiotics to get this under control. I am very concerned about the severity of his infection and the amount of discomfort he is having. 07/31/2020 upon evaluation today patient appears to be doing about the same in regard to his leg wound. He did see Dr. Megan Salon and Dr. Megan Salon is actually going to start him on IV antibiotics. He goes for the PICC line tomorrow. With that being said there do not have that run for 2 weeks and then see how things are doing and depending on how he is progressing they may extend that a little longer. Nonetheless I am glad this is getting ready to be in place and definitely feel it may help the patient. In the meantime is been using mainly triamcinolone to the wound bed has an anti-inflammatory. 08/07/2020 on evaluation today patient appears to be doing well with regard to his wound compared even last week. In the interim he has gotten the PICC line placed and overall this seems to be doing excellent. There does not appear to be any evidence of infection which is great news systemically although locally of course has had the infection this appears to be improving with the use of the antibiotics. 08/14/2020 upon evaluation today patient's wound actually showing signs of excellent improvement. Overall the irritation has significantly improved the drainage is back down to more of a normal level and his pain is really pretty much nonexistent compared to what it was. Obviously I think that this is significantly improved  secondary to the IV antibiotic therapy which has made all the difference in the world. Again he had a resistant form of Pseudomonas for which oral antibiotics just was not cutting it. Nonetheless I do think that still we need to consider the possibility of a  surgical closure for this wound is been open so long and to be honest with muscle exposed I think this can be very hard to get this to close outside of this although definitely were still working to try to do what we can in that regard. 08/21/2020 upon evaluation today patient appears to be doing very well with regard to his wounds on the left lateral lower extremity/calf area. Fortunately there does not appear to be signs of active infection which is great news and overall very pleased with where things stand today. He is actually wrapping up his treatment with IV antibiotics tomorrow. After that we will see where things go from there. 08/28/2020 upon evaluation today patient appears to be doing decently well with regard to his leg ulcer. There does not appear to be any signs of Nitsch, Timohty ([id number]) active infection which is great news and overall very pleased with where things stand today. No fevers, chills, nausea, vomiting, or diarrhea. 09/18/2020 upon evaluation today patient appears to be doing well with regard to his infection which I feel like is better. Unfortunately he is not doing as well with regard to the overall size of the wound which is not nearly as good at this point. I feel like that he may be having an issue here with the pyoderma being somewhat out of control. I think that he may benefit from potentially going back and talking to the dermatologist about what to do from the pyoderma standpoint. I am not certain if the infusions are helping nearly as much is what the prednisone did in the past. 10/02/2020 upon evaluation today patient appears to be doing well with regard to his leg ulcer. He did go to the [facility] nurse.  Unfortunately they feel like there is a 10% chance that most that he would be able to heal and that the skin graft would take. Obviously this has led him to not be able to go down that path as far as treatment is concerned. Nonetheless he does seem to be doing a little bit better with the prednisone that I gave him last time. I think that he may need to discuss with dermatology the possibility of long-term prednisone as that seems to be what is most helpful for him to be perfectly honest. I am not sure the Remicade is really doing the job. 10/17/2020 upon evaluation today patient appears to be doing a little better in regard to his wound. In fact the case has been since we did the prednisone on May 2 for him that we have noticed a little bit of improvement each time we have seen a size wise as well as appearance wise as well as pain wise. I think the prednisone has had a greater effect then the infusion therapy has to be perfectly honest. With that being said the patient also feels significantly better compared to what he was previous. All of this is good news but nonetheless I am still concerned about the fact that again we are really not set up to long-term manage him as far as prednisone is concerned. Obviously there are things that you need to be watched I completely understand the risk of prednisone usage as well. 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 With all that being said I do believe that we can give him another round of the prednisone which she is requesting today because of the improvement that he seen since we  did that first round. 10/30/2020 upon evaluation today patient's wound actually is showing signs of doing quite well. There does not appear to be any evidence of infection which is great news and overall very pleased with where things stand today. No fevers, chills, nausea, vomiting, or diarrhea. He tells me that the prednisone still  has seem to have helped he wonders if we can extend that for just a little bit longer. He did not have the appointment with a dermatologist although he did have an infusion appointment last Friday. That was at [facility] With that being said he tells me he could not do both that as well as the appointment with the physician on the same day therefore that is can have to be rescheduled. I really want to see if there is anything they feel like that could be done differently to try to help this out as I am not really certain that the infusions are helping significantly here. 11/13/2020 upon evaluation today patient unfortunately appears to be doing somewhat poorly in regard to his wound I feel like this is actually worsening from the standpoint of the pyoderma spreading. I still feel like that he may need something different as far as trying to manage this going forward. Again we did the prednisone unfortunately his blood sugars are not doing so well because of this. Nonetheless I believe that the patient likely needs to try topical steroid. We have done triamcinolone for a while I think going with something stronger such as clobetasol could be beneficial again this is not something I do lightly I discussed this with the patient that again this does not normally put underneath an occlusive dressing. Nonetheless I think a thin film as such could help with some of the stronger anti-inflammatory effects. We discussed this today. He would like to try to give this a trial for the next couple weeks. I definitely think that is something that we can do. Evaluate7/03/2021 and today patient's wound bed actually showed signs of doing really about the same. There was a little expansion of the size of the wound and that leading edge that we done looking out although the clobetasol does seem to have slowed this down a bit in my opinion. There is just 1 small area that still seems to be progressing based on what I see.  Nonetheless I am concerned about the fact this does not seem to be improving if anything seems to be doing a little bit worse. I do not know that the infusions are really helping him much as next infusion is August 5 his appointment with dermatology is July 25. Either way I really think that we need to have a conversation potentially about this and I am actually going to see if I can talk with Dr. Lillia Carmel in order to see where things stand as well. 12/11/2020 upon evaluation today patient appears to be doing worse in regard to his leg ulcer. Unfortunately I just do not think this is making the progress that I would like to see at this point. Honestly he does have an appointment with dermatology and this is in 2 days. I am wondering what they may have to offer to help with this. Right now what I am seeing is that he is continuing to show signs of worsening little by little. Obviously that is not great at all. Is the exact opposite of what we are looking for. 12/18/2020 upon evaluation today patient appears to be doing a little better  in regard to his wound. The dermatologist actually did do some steroid injections into the wound which does seem to have been beneficial in my opinion. That was on the 25th already this looks a little better to me than last time I saw him. With that being said we did do a culture and this did show that he has Staph aureus noted in abundance in the wound. With that being said I do think that getting him on an oral antibiotic would be appropriate as well. Also think we can compression wrap and this will make a difference as well. 12/28/2020 upon evaluation today patient's wound is actually showing signs of doing much better. I do believe the compression wrap is helping he has a lot of drainage but to be honest I think that the compression is helping to some degree in this regard as well as not draining through which is also good news. No fevers, chills, nausea, vomiting, or  diarrhea. 01/04/2021 upon evaluation today patient appears to be doing well with regard to his wound. Overall things seem to be doing quite well. He did have a little bit of reaction to the CarboFlex Sorbact he will be using that any longer. With that being said he is controlled as far as the drainage is concerned overall and seems to be doing quite well. I do not see any signs of active infection at this time which is great news. No fevers, chills, nausea, vomiting, or diarrhea. 01/11/2021 upon evaluation today patient appears to be doing well with regard to his wounds. He has been tolerating the dressing changes without complication. Fortunately there does not appear to be any signs of active infection at this time which is great news. Overall I am extremely pleased with where we stand currently. No fevers, chills, nausea, vomiting, or diarrhea. Where using clobetasol in the wound bed he has a lot of new skin growth which is awesome as well. 01/18/2021 upon evaluation today patient appears to be doing very well in regard to his leg ulcer. He has been tolerating the dressing changes without complication. Fortunately there does not appear to be any signs of active infection which is great news. In general I think that he is making excellent progress 01/25/2021 upon evaluation today patient appears to be doing well with regard to his wound on the leg. I am actually extremely pleased with where things stand today. There does not appear to be any signs of active infection which is great news and overall I think that we are definitely headed in the appropriate direction based on what I am seeing currently. There does not appear to be any signs of active infection also excellent news. 02/06/2021 upon evaluation today patient appears to be doing well with regard to his wound. Overall visually this is showing signs of significant Bohan, Vann ([id number]) improvement which is great news. I do not see any signs of  active infection systemically which is great even locally I do not think that we are seeing any major complications here. We did do fluorescence imaging with the [employer] today. The patient does have some odor and drainage noted and again this is something that I think would benefit him to probably come more frequently for nurse visits. 02/19/2021 upon evaluation today patient actually appears to be doing quite well in regard to his wound. He has been tolerating the dressing changes without complication and overall I think that this is making excellent progress. I do not see any  evidence of active infection at this point which is great news as well. No fevers, chills, nausea, vomiting, or diarrhea. 10/10; wound is made nice progress healthy granulation with a nice rim of epithelialization which seems to be expanding even from last week he has a deeper area in the inferior part of the more distal part of the wound with not quite as healthy as surface. This area will need to be followed. Using clobetasol and Hydrofera Blue 03/05/2021 upon evaluation today patient appears to be doing very well in regard to his leg ulcer. He has been tolerating dressing changes without complication. Fortunately there does not appear to be any signs of active infection which is great news and overall I am extremely pleased with where we stand currently. 03/12/2021 upon evaluation today patient appears to be doing well with regard to his wound in fact this is extremely extremely good based on what we are seeing today there does not appear to be any signs of active infection and overall I think that he is doing awesome from the standpoint of healing in general. I am extremely pleased with how things seem to be progressing with regard to this pyoderma. Clobetasol has done wonders for him. I think the compression wrapping has also been of great benefit. 03/19/2021 upon evaluation today patient appears to be doing well  with regard to his wound. He is tolerating the dressing changes without complication. In fact I feel like that he is actually making excellent progress at this point based on what I am seeing. No fevers, chills, nausea, vomiting, or diarrhea. 03/26/2021 upon evaluation today patient appears to be doing well with regard to his wound. This again is measuring smaller and looking better. Again the progress is slow but nonetheless continual with what we have been seeing. I do believe that the current plan is doing awesome for him. 04/09/2021 upon evaluation today patient appears to be doing well with regard to his leg ulcer. This is showing signs of excellent improvement the muscle is completely closed over and there does not appear to be any evidence of inflammation at this point his drainage is significantly improved. Overall I think that he would be a good candidate for looking into a skin substitute at this point as well. We will get a look into some approvals in that regard. Potentially TheraSkin as well as Apligraf could both be considered just depending on insurance coverage. 04/16/2021 upon evaluation today patient appears to be doing well at this point. He has been approved for the Apligraf which we could definitely order although I would like to try to get the TheraSkin approved if at all possible. I did fax notes into them today and I Georgina Peer try to give a call as well. Overall the wound appears to be doing decently well today. 04/20/2021 upon evaluation today patient actually appears to be doing quite well in regard to his wound with that being said we are trying to see what we can do about speeding up the healing process. For that reason we did discuss the possibility of a skin substitute. We got the Apligraf approved. For that reason we will get a go ahead and see what we can do with the Apligraf at this point. I am still trying to get the TheraSkin approved but I have not heard anything from the  insurance company yet I have called and talked to them earlier in the week and to be honest this was about half an hour that  I spent on the phone they told me I would hear something within 10-15 business days. 04/27/2021 upon evaluation today patient appears to be doing excellent in regard to his wound. I do believe the Apligraf has been beneficial. With that being said he has had a little bit of increased pain has been a little bit concerned. I do want to go ahead and apply his steroid cream today the clobetasol and then put the Apligraf over I just do not think I want to risk not putting the clobetasol on based on what is been going on here currently. Patient voiced understanding. 05/04/2021 upon evaluation today patient is making excellent improvement. Overall there is definitely decrease in the size of the wound today and I am very pleased in that regard. I do not see any signs of active infection locally nor systemically at this point. No fevers, chills, nausea, vomiting, or diarrhea. 05/11/2021 upon evaluation today patient appears to be doing well with regard to his wound. He has been tolerating Apligraf's without complication and is making excellent progress this is application #4 today. 12/30; patient here for Apligraf application. Appears to be doing well small wound there has been a lot of healing 05/28/2021 upon evaluation today patient appears to be doing well with regard to his wound. Has been tolerating the dressing changes without complication. Fortunately I do not see any signs of active infection locally nor systemically at this point which is great news. He is done with the Apligraf and the first round and overall this has filled in quite nicely I am not even certain he needs any additional at the moment. I would actually try to go back to clobetasol with [facility] which previously was doing well for him. 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 He is using clobetasol and Hydrofera Blue. Surface area of the wound is smaller and the surface looks very healthy. 06/11/2021 upon evaluation today patient actually appears to be showing signs of good improvement which is great news. Fortunately I do not see any signs of active infection at this time which is also excellent news. I do believe that the patient is doing well with the clobetasol and Hydrofera Blue. He does have some irritation and itching around the rest of the leg will use some triamcinolone over this area. 06/18/2021 upon evaluation today patient appears to be doing well with regard to his wound. He is showing signs of excellent improvement and overall I am extremely pleased with where we stand today. We are moving in the right direction. 06/25/2021 upon evaluation today patient appears to be doing well with regard to his wound. In fact this is doing also not showing signs of excellent improvement overall very pleased with where we stand today. I do not see any evidence of active infection locally or systemically at this time which is also great news. 07/02/2021 upon evaluation patient's wound bed actually showed signs of good granulation and epithelization at this point. And this is measuring significantly smaller and overall I am extremely pleased with where we stand today. No fevers, chills, nausea, vomiting, or diarrhea. 07/09/2021 upon evaluation patient's wound bed actually showed signs of good granulation and epithelization at this point. Fortunately there does not appear to be any evidence of active infection locally or systemically which is great news and overall I am extremely pleased with where we KACY, CONELY ([id number]) stand at this point. 07/16/2021 upon evaluation today patient appears to  be doing well with regard to his wound all things considered although it is maybe a little bit larger than what its been. This does have me a little concerned.  Actually question about whether anything is changed and initially he told me know. In fact it was not until the end of the visit that he actually did mention that he had missed his infusion in January. This very well may be what is because the difference is also seeing currently. That definitely has seem to been helping more recently. 07/23/2021 upon evaluation today patient's wound is yet again larger despite the switch to a collagen which was not beneficial. Subsequently I am going to at this point check on a couple things. First and foremost I do want to go ahead and do a culture. I think that there is a chance he could have a low-lying infection that may be part of the issue here. Subsequently I am also probably can go ahead and place him on Bactrim DS which I think is a good option and has done well with them in the past. Again if this is an infection that should hopefully help to turn things around. With all that being said I do believe that the patient should hopefully be able to turn this around with reinitiation of the infusion therapy which will be going on this week and subsequently getting on the antibiotic. 3/13; patient presents for follow-up. He has no issues or complaints today. He states he is currently on oral antibiotics for previous culture done in the office. 08/06/2021 upon evaluation today patient appears to be doing well with regard to his wound. I am actually seeing signs of improvement which is great news. I think that he is on a better track he did receive his infusion as well which is great news and overall I think that this should hopefully get him back on track as far as healing is concerned. He is not having any pain and I do not see any signs of inflammation which is great news. 08/13/2021 upon evaluation today patient appears to be doing excellent in regard to his wound on the leg. He has been tolerating the dressing changes without complication. Fortunately I do not see  any signs of infection I do believe he is making good progress here and I think her back on track overall the tissue is greatly improved compared to what we have been. 08/20/2021 upon evaluation today patient appears to be doing excellent in regard to his wound. He has been tolerating the dressing changes without complication. Fortunately I do not see any evidence of infection at this time locally or systemically which is great news and overall very pleased with where we stand. 08-31-2021 upon evaluation today patient appears to be doing excellent in regard to his wound. Overall even with the vacation and extra walking he seems to have really done well. Fortunately I do not see any evidence of active infection locally or systemically which is great news and overall I think we are headed in the right direction. 09-10-2021 upon evaluation today patient's wound is showing signs of improvement. This is a small improvement but nonetheless we seem to be making progress here. I am very pleased in that regard. There does not appear to be any signs of infection and overall things are doing quite nicely. Upon inspection patient's wound bed actually showed signs of good granulation and epithelization at this point. There was some irritation around the edges of the wound  where the good skin was it almost appears that something was rubbing or else is was due from drainage and irritation. Nonetheless I am going to see about putting a little bit of zinc just around the wound opening in order to protect the skin from being damaged. The patient is in agreement with that plan. 5/8; its been a while since I have seen this wound and it looks considerably better although still with some depth. Per our intake nurse the dimensions have improved patient has been using a thin layer of clobetasol, Hydrofera Blue under 4-layer compression. He appears to be making progress 10-01-2021 upon evaluation today patient appears to be  doing well with regard to his wound although it is somewhat stalled I feel like were basically at a standstill here. He has previously had Apligraf which did very well for him to be honest. I do believe that he may benefit from a repeat of the Apligraf. In fact it got so small at that point but I do not think we even used all of the applications that were recommended as this had improved to such a degree. Nonetheless I do believe that the patient currently could benefit from repeat treatment with the Apligraf due to the improvement this that we did see. He is not opposed to this and subsequently working to look into getting approval to reinstitute that treatment. 10-08-2021 upon evaluation today patient appears to be doing well with regard to his wound. Overall I feel like he is doing a little bit better from the standpoint of irritation at this time which is great news. I do not see any signs of active infection locally or systemically which is great news as well. No fevers, chills, nausea, vomiting, or diarrhea. 10-12-2021 upon evaluation today patient appears to be doing well with regard to his wound stable although we are can initiate treatment with Apligraf today. I am hoping this will stimulate things to improve. Patient is in agreement with that plan. 10-19-2028 upon evaluation today patient appears to be doing well with regard to his wound. He has been tolerating the dressing changes without complication. Fortunately there does not appear to be any evidence of active infection locally or systemically at this time which is great news. No fevers, chills, nausea, vomiting, or diarrhea. I do believe the Apligraf has improved the overall appearance of the wound bed receiving some definite improvements here. 10-26-2021 upon evaluation today patient's wound is actually shown some signs of really good improvement I am very pleased in this regard. There is a little bit of slough and biofilm buildup that is  stuck to the surface of the wound I am getting very lightly debrided in order to clear this away and then subsequently will get a continue with the Apligraf application today. This is #3. 11-09-2021 upon evaluation today patient appears to be doing well currently in regard to his wound. This is actually showing signs of significant improvement which is great news and overall I think the Apligraf is doing an amazing job. This wound is significantly smaller compared to the last time I saw him. This will be Apligraf application #4. 12-19-6413 upon evaluation today patient appears to be doing well currently in regard to his wound he is actually showing signs of excellent improvement which is great news and overall I am extremely pleased with where we stand today. I do not see any evidence of active infection locally or systemically at this time which is great news. No fevers,  chills, nausea, vomiting, or diarrhea. I believe the Apligraf is doing an awesome job and in fact I [place] see about getting an extension of application which hopefully should not be a problem. Especially in light of how much improvement he has had with regards to the size of the wound with treatment. This is application #5 today. MICHAI, DIEPPA ([id number]) 12-07-2021 upon evaluation today patient appears to be doing excellent in regard to his wound has been tolerating the dressing changes without complication. Fortunately there does not appear to be any signs of active infection locally or systemically at this time. 12-21-2021 upon evaluation today patient appears to be doing well currently in regard to his wound. We have gotten approval for a repeat Apligraf treatment. I think that is going to be a good way to wrap this up I really feel like we will likely be able to get the wound closed with the additional treatments that have been improved. 01-04-2022 upon evaluation patient appears to be doing excellent in regard to his wound. He is  tolerating the dressing changes without complication and is measuring much better today as well. I am very pleased with where we stand. I am not even certain we will get have to use all 5 of the reapproved Apligraf but we will see how things go right now I am extremely pleased with the progress that is been made. 01-18-2022 upon evaluation today patient appears to be doing well with regard to his wound is actually measuring significantly better which is great news. This is still open but very minimally. I do think that it is appropriate to continue with the Apligraf he has done so well with this. 02-01-2022 upon evaluation today patient actually appears to be doing well currently with regard to his wound in fact this appears to be pretty much completely healed. There is just a very pinpoint area that is leaking a little clear fluid otherwise this is doing quite well. I do not see any evidence of active infection at this time. 02-08-2022 upon evaluation today patient appears to be doing well currently in regard to his wound in fact this is showing signs of complete resolution as of today. 02-15-2022 upon evaluation today patient continues to do well there is really no signs of active infection at this time. He is tolerating the dressing changes without complication and to be honest still appears to be closed which is absolutely awesome. I am hopeful we will be able to discharge him next week without any complication. Objective Constitutional Well-nourished and well-hydrated in no acute distress. Vitals Time Taken: 8:11 AM, Height: 71 in, Weight: 338 lbs, BMI: 47.1, Temperature: 98.3 F, Pulse: 89 bpm, Respiratory Rate: 16 breaths/min, Blood Pressure: 129/80 mmHg. Respiratory normal breathing without difficulty. Psychiatric this patient is able to make decisions and demonstrates good insight into disease process. Alert and Oriented x 3. pleasant and cooperative. General Notes: Patient's wound again  showed complete epithelization there was a little bit of dry skin here and there I was able to carefully remove there is nothing open underneath it overall I feel like he is very close to discharge I would recommend next week will probably be the discharge today he will have his compression sock with him and then we will go from there. 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 TRUTH, BAROT ([id number]) Post procedure Diagnosis Wound #: Same as Pre-Procedure Plan Follow-up Appointments: Return Appointment in 1 week. Bathing/ Shower/ Hygiene: Clean wound with Normal Saline or wound cleanser. May shower with wound dressing protected with water repellent cover or cast protector. No tub bath. Edema Control - Lymphedema / Segmental Compressive Device / Other: Optional: One layer of unna paste to top of compression wrap (to act as an anchor). - He needs this to hold it up 4 Layer Compression System Lymphedema. Elevate, Exercise Daily and Avoid Standing for Long Periods of Time. Elevate legs to the level of the heart and pump ankles as often as possible Elevate leg(s) parallel to the floor when sitting. Medications-Please add to medication list.: Other: - clobetasol to area, then Mepitel, steristrips, bolus 1. I am going to suggest that we go ahead and continue with the recommendation for wound care measures as before and the patient is in agreement with plan. This includes the use of the 4-layer compression wrap which is doing well. Were also using clobetasol over the previous wound area with Mepitel. 2. I am also can recommend that we have  the patient continue with the monitoring as far as his leg is concerned if he develops any pain or any complications he knows to definitely contact the office and let me know as soon as possible. We will see patient back for reevaluation in 1 week here in the clinic. If anything worsens or changes patient will contact our office for additional recommendations. Electronic Signature(s) Signed: 02/15/2022 8:58:39 AM By: Worthy Keeler PA-C Entered By: Worthy Keeler on 02/15/2022 08:58:38 TARON, MONDOR ([id number]) -------------------------------------------------------------------------------- SuperBill Details Patient Name: [name] Date of Service: 02/15/2022 Medical Record Number: [id number] Patient Account Number: [id number] Date of Birth/Sex: 1981-01-02 (41 y.o. M) Treating RN: Carlene Coria Primary Care Provider: [name] Other Clinician: Massie Kluver Referring Provider: [name] Treating Provider/Extender: [name] Cliche in Treatment: 271 Diagnosis Coding ICD-10 Codes Code Description I87.2 Venous insufficiency (chronic) (peripheral) L97.222 Non-pressure chronic ulcer of left calf with fat layer exposed E11.622 Type 2 diabetes mellitus with other skin ulcer L88 Pyoderma gangrenosum F17.208 Nicotine dependence, unspecified, with other nicotine-induced disorders Facility Procedures CPT4 Code: [id number] Description: (Facility Use Only) [phone] - [facility] LWR LT LEG Modifier: Quantity: 1 Physician Procedures CPT4 Code: 6808811 Description: 03159 - WC PHYS LEVEL 3 - EST PT Modifier: Quantity: 1 CPT4 Code: Description: ICD-10 Diagnosis Description I87.2 Venous insufficiency (chronic) (peripheral) L97.222 Non-pressure chronic ulcer of left calf with fat layer exposed E11.622 Type 2 diabetes mellitus with other skin ulcer L88 Pyoderma gangrenosum Modifier: Quantity: Electronic Signature(s) Signed: 02/15/2022 8:59:00 AM By: Worthy Keeler  PA-C Entered By: Worthy Keeler on 02/15/2022 08:59:00

## 2022-02-18 ENCOUNTER — Ambulatory Visit: Payer: 59

## 2022-02-20 ENCOUNTER — Ambulatory Visit: Payer: 59

## 2022-02-22 ENCOUNTER — Encounter: Payer: 59 | Attending: Physician Assistant | Admitting: Physician Assistant

## 2022-02-22 DIAGNOSIS — E11622 Type 2 diabetes mellitus with other skin ulcer: Secondary | ICD-10-CM | POA: Diagnosis not present

## 2022-02-22 DIAGNOSIS — I872 Venous insufficiency (chronic) (peripheral): Secondary | ICD-10-CM | POA: Diagnosis present

## 2022-02-22 DIAGNOSIS — L88 Pyoderma gangrenosum: Secondary | ICD-10-CM | POA: Insufficient documentation

## 2022-02-22 DIAGNOSIS — F17200 Nicotine dependence, unspecified, uncomplicated: Secondary | ICD-10-CM | POA: Diagnosis not present

## 2022-02-22 DIAGNOSIS — Z09 Encounter for follow-up examination after completed treatment for conditions other than malignant neoplasm: Secondary | ICD-10-CM | POA: Insufficient documentation

## 2022-02-22 DIAGNOSIS — L97222 Non-pressure chronic ulcer of left calf with fat layer exposed: Secondary | ICD-10-CM | POA: Insufficient documentation

## 2022-02-22 NOTE — Progress Notes (Addendum)
[name], [name] ([id number]) Visit Report for 02/22/2022 Chief Complaint Document Details Patient Name: [name] Date of Service: 02/22/2022 8:15 AM Medical Record Number: [id number] Patient Account Number: [id number] Date of Birth/Sex: 02-24-1981 (41 y.o. 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 M) Treating RN: [name] Primary Care Provider: [name] Other Clinician: Massie Kluver Referring Provider: [name] Treating Provider/Extender: [name] Cliche in Treatment: 272 History of Present Illness HPI Description: 12/04/16; 41 year old man who comes into the clinic today for review of a wound on the posterior left calf. He tells me that is been there for about a year. He is not a diabetic he does smoke half a pack per day. He was seen in the ER on 11/20/16 felt to have cellulitis around the wound and was given clindamycin. An x-ray did not show osteomyelitis. The patient initially tells me that he has a milk allergy that sets off a pruritic itching rash on his lower legs which she scratches incessantly and he thinks that's what may have set up the wound. He has been using various topical antibiotics and ointments without any effect. He works in a [employer] and is on his feet all day. He  does not have a prior history of wounds however he does have the rash on both lower legs the right arm and the ventral aspect of his left arm. These are excoriations and clearly have had scratching however there are of macular looking areas on both legs including a substantial larger area on the right leg. This does not have an underlying open area. There is no blistering. The patient tells me that 2 years ago in [place] in response to the rash on his legs he saw a dermatologist who told him he had a condition which may be pyoderma gangrenosum although I may be putting words into his mouth. He seemed to recognize this. On further questioning he admits to a 5 year history of quiesced. ulcerative colitis. He is not in any treatment for this. He's had no recent travel 12/11/16; the patient arrives today with his wound and roughly the same condition we've been using silver alginate this is a deep punched out wound with some surrounding erythema but no tenderness. Biopsy I did did not show confirmed pyoderma gangrenosum suggested nonspecific inflammation and vasculitis but does not provide an actual description of what was seen by the pathologist. I'm really not able to understand this We have also received information from the patient's dermatologist in [place] notes from April 2016. This was a doctor Agarwal-antal. 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 He was prescribed topical steroid high potency under occlusion which helped but at this point the patient did not have a deep punched out wound. 12/18/16; the patient's wound is larger in terms of surface area however this surface looks better and there is less depth. The surrounding erythema also is better. The patient states that the wrap we put on came off 2 days ago when he has been  using his compression stockings. He we are in the process of getting a dermatology consult. 12/26/16 on evaluation today patient's left lower extremity wound  shows evidence of infection with surrounding erythema noted. He has been tolerating the dressing changes but states that he has noted more discomfort. There is a larger area of erythema surrounding the wound. No fevers, chills, nausea, or vomiting noted at this time. With that being said the wound still does have slough covering the surface. He is not allergic to any medication that he is aware of at this point. In regard to his right lower extremity he had several regions that are erythematous and pruritic he wonders if there's anything we can do to help that. 01/02/17 I reviewed patient's wound culture which was obtained his visit last week. He was placed on doxycycline at that point. Unfortunately that does not appear to be an antibiotic that would likely help with the situation however the pseudomonas noted on culture is sensitive to Cipro. Also unfortunately patient's wound seems to have a large compared to last week's evaluation. Not severely so but there are definitely increased measurements in general. He is continuing to have discomfort as well he writes this to be a seven out of 10. In fact he would prefer me not to perform any debridement today due to the fact that he is having discomfort and considering he has an active infection on the little reluctant to do so anyway. No fevers, chills, nausea, or vomiting noted at this time. 01/08/17; patient seems dermatology on September 5. I suspect dermatology will want the slides from the biopsy I did sent to their pathologist. I'm not sure if there is a way we can expedite that. In any case the culture I did before I left on vacation 3 weeks ago showed Pseudomonas he was given 10 days of Cipro and per her description of her intake nurses is actually somewhat better this week although the wound is quite a bit bigger than I remember the last time I saw this. He still has 3 more days of Cipro 01/21/17; dermatology appointment tomorrow. He has completed  the ciprofloxacin for Pseudomonas. Surface of the wound looks better however he is had some deterioration in the lesions on his right leg. Meantime the left lateral leg wound we will continue with sample 01/29/17; patient had his dermatology appointment but I can't yet see that note. He is completed his antibiotics. The wound is more superficial but considerably larger in circumferential area than when he came in. This is in his left lateral calf. He also has swollen erythematous areas with superficial wounds on the right leg and small papular areas on both arms. There apparently areas in her his upper thighs and buttocks I did not look at those. Dermatology biopsied the right leg. Hopefully will have their input next week. 02/05/17; patient went back to see his dermatologist who told him that he had a "scratching problem" as well as staph. He is now on a 30 day course of doxycycline and I believe she gave him triamcinolone cream to the right leg areas to help with the itching [not exactly sure but probably triamcinolone]. She apparently looked at the left lateral leg wound although this was not rebiopsied and I think felt to be ultimately part of the same pathogenesis. He is using sample border foam and changing nevus himself. He now has a new open area on the right posterior leg which was his biopsy site I don't have any  of the dermatology notes 02/12/17; we put the patient in compression last week with SANTYL to the wound on the left leg and the biopsy. Edema is much better and the depth of the wound is now at level of skin. Area is still the same oBiopsy site on the right lateral leg we've also been using santyl with a border foam dressing and he is changing this himself. 02/19/17; Using silver alginate started last week to both the substantial left leg wound and the biopsy site on the right wound. He is tolerating compression well. Has a an appointment with his primary M.D. tomorrow wondering about  diuretics although I'm wondering if the edema problem is actually lymphedema 02/26/17; the patient has been to see his primary doctor Dr. Jerrel Ivory at [place] our primary care. She started him on Lasix 20 mg and this seems to have helped with the edema. However we are not making substantial change with the left lateral calf wound and inflammation. The biopsy site on the right leg also looks stable but not really all that different. 03/12/17; the patient has been to see vein and vascular Dr. Lucky Cowboy. He has had venous reflux studies I have not reviewed these. I did get a call from his dermatology office. They felt that he might have pathergy based on their biopsy on his right leg which led them to look at the slides of KERN, GINGRAS ([id number]) the biopsy I did on the left leg and they wonder whether this represents pyoderma gangrenosum which was the original supposition in a man with ulcerative colitis albeit inactive for many years. They therefore recommended clobetasol and tetracycline i.e. aggressive treatment for possible pyoderma gangrenosum. 03/26/17; apparently the patient just had reflux studies not an appointment with Dr. dew. She arrives in clinic today having applied clobetasol for 2-3 weeks. He notes over the last 2-3 days excessive drainage having to change the dressing 3-4 times a day and also expanding erythema. 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 Lillia Carmel at [facility] dermatology locally who kindly saw him at my request. 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 My concern was the degree of erythema medially extending from his left leg wound which was either  inflammation from pyoderma or cellulitis. I put him on Augmentin however culture of the wound showed Pseudomonas which is quinolone sensitive. I really don't believe he has cellulitis however in view of everything I will continue and give him a course of Cipro. He is also on doxycycline as an immune modulator for the pyoderma. In addition to his original wound on the left lateral leg with surrounding erythema he has a wound on the right posterior calf which was an original biopsy site done by dermatology. This was felt to represent pathergy from pyoderma gangrenosum 04/16/17; pyoderma gangrenosum. Saw Dr. Lillia Carmel yesterday. He has been using topical antibiotics to both wound areas his original wound on the left and the biopsies/pathergy area on the right. There is definitely some improvement in the inflammation around the wound on the right although the patient states he has increasing sensitivity of the wounds. He is on prednisone 60 and doxycycline 1 as prescribed by Dr. Lillia Carmel. He is covering the  topical antibiotic with gauze and putting this in his own compression stocks and changing this daily. He states that Dr. Lottie Rater did a culture of the left leg wound yesterday 05/07/17; pyoderma gangrenosum. The patient saw Dr. Lillia Carmel yesterday and has a follow-up with her in one month. He is still using topical antibiotics to both wounds although he can't recall exactly what type. He is still on prednisone 60 mg. Dr. Lillia Carmel stated that the doxycycline could stop if we were in agreement. 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 The right medial leg was induced by biopsy/pathergy. The area on the right is essentially healed. Still on high-dose prednisone using topical antibiotics to the wound 07/09/17; pyoderma gangrenosum with wounds on the left lateral leg. The right medial leg has  closed and remains closed. He is still on prednisone 60. oHe tells me he missed his last dermatology appointment with Dr. Lillia Carmel but will make another appointment. He reports that her blood sugar at a recent screen in [place] was high 200's. He was 180 today. 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 I think he is going to require still much longer prednisone perhaps another 3 months before attempting to taper. In the meantime his wound is a lot better. Smaller. He is cleaning this off daily and applying topical antibiotics. When he was last in the clinic I thought about changing to [name] and actually put in a couple of calls to dermatology although probably not during their business hours. In any case the wound looks better smaller I don't think there is any need to change what he is doing 08/06/17-he is here in follow up evaluation for pyoderma left leg ulcer. He continues on oral prednisone. He has been using triple antibiotic ointment. There is surface debris and we will transition to [facility] and have him return in 2 weeks. He has lost 30 pounds since his last appointment with lifestyle modification. He may benefit from topical steroid cream for treatment this can be considered at a later date. 08/22/17 on evaluation today patient appears to actually be doing rather well in regard to his left lateral lower extremity ulcer. He has actually been managed by Dr. Dellia Nims most recently. Patient is currently on oral steroids at this time. This seems to have been of benefit for him. Nonetheless his last visit was actually with Leah on 08/06/17. Currently he is not utilizing any topical steroid creams although this could be of benefit as well. No fevers, chills, nausea, or vomiting noted at this time. 09/05/17 on evaluation today patient appears to be doing better in regard to his left lateral lower extremity ulcer. He has been tolerating the dressing changes without complication. He is using Santyl  with good effect. Overall I'm very pleased with how things are standing at this point. Patient likewise is happy that this is doing better. 09/19/17 on evaluation today patient actually appears to be doing rather well in regard to his left lateral lower extremity ulcer. Again this is secondary to Pyoderma gangrenosum and he seems to be progressing well with the Santyl which is good news. He's not having any significant pain. 10/03/17 on evaluation today patient appears to be doing excellent in regard to his lower extremity wound on the left secondary to Pyoderma gangrenosum. He has been tolerating the Santyl without complication and in general I feel like he's making good progress. 10/17/17  on evaluation today patient appears to be doing very well in regard to his left lateral lower surety ulcer. He has been tolerating the dressing changes without complication. There does not appear to be any evidence of infection he's alternating the Santyl and the triple antibiotic ointment every other day this seems to be doing well for him. 11/03/17 on evaluation today patient appears to be doing very well in regard to his left lateral lower extremity ulcer. He is been tolerating the dressing changes without complication which is good news. Fortunately there does not appear to be any evidence of infection which is also great news. Overall is doing excellent they are starting to taper down on the prednisone is down to 40 mg at this point it also started topical clobetasol for him. 11/17/17 on evaluation today patient appears to be doing well in regard to his left lateral lower surety ulcer. He's been tolerating the dressing changes without complication. He does note that he is having no pain, no excessive drainage or discharge, and overall he feels like things are going about how he would expect and hope they would. Overall he seems to have no evidence of infection at this time in my opinion which is  good news. 12/04/17-He is seen in follow-up evaluation for right lateral lower extremity ulcer. He has been applying topical steroid cream. Today's measurement show slight increase in size. Over the next 2 weeks we will transition to every other day Santyl and steroid cream. He has been encouraged to monitor for changes and notify clinic with any concerns 12/15/17 on evaluation today patient's left lateral motion the ulcer and fortunately is doing worse again at this point. This just since last week to this week has close to doubled in size according to the patient. I did not seeing last week's I do not have a visual to compare this to in our system was also down so we do not have all the charts and at this point. Nonetheless it does have me somewhat concerned in regard to the fact that again he was worried enough about it he has contact the dermatology that placed them back on the full strength, 50 mg a day of the prednisone that he was taken previous. He continues to alternate using clobetasol along with Santyl at this point. He is obviously somewhat frustrated. 12/22/17 on evaluation today patient appears to be doing a little worse compared to last evaluation. Unfortunately the wound is a little deeper and slightly larger than the last week's evaluation. With that being said he has made some progress in regard to the irritation surrounding at this time unfortunately despite that progress that's been made he still has a significant issue going on here. 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 [name], [name] ([id number]) For that reason I discussed with him today the possibility of perform a wound culture to ensure there's nothing overtly infected. 01/06/18 on evaluation today patient's wound is larger and deeper than previously evaluated. With that being  said it did appear that his wound was infected after my last evaluation with him. Subsequently I did end up prescribing a prescription for Bactrim DS which she has been taking and having no complication with. Fortunately there does not appear to be any evidence of infection at this point in time as far as anything spreading, no want to touch, and  overall I feel like things are showing signs of improvement. 01/13/18 on evaluation today patient appears to be even a little larger and deeper than last time. There still muscle exposed in the base of the wound. Nonetheless he does appear to be less erythematous I do believe inflammation is calming down also believe the infection looks like it's probably resolved at this time based on what I'm seeing. No fevers, chills, nausea, or vomiting noted at this time. 01/30/18 on evaluation today patient actually appears to visually look better for the most part. Unfortunately those visually this looks better he does seem to potentially have what may be an abscess in the muscle that has been noted in the central portion of the wound. This is the first time that I have noted what appears to be fluctuance in the central portion of the muscle. With that being said I'm somewhat more concerned about the fact that this might indicate an abscess formation at this location. I do believe that an ultrasound would be appropriate. This is likely something we need to try to do as soon as possible. He has been switch to mupirocin ointment and he is no longer using the steroid ointment as prescribed by dermatology he sees them again next week he's been decreased from 60 to 40 mg of prednisone. 03/09/18 on evaluation today patient actually appears to be doing a little better compared to last time I saw him. There's not as much erythema surrounding the wound itself. He I did review his most recent infectious disease note which was dated 02/24/18. He saw Dr. Michel Bickers in [place].  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 Patient is now off of antibiotics and they are continually observing this. There seems to be no change in the past two weeks in my pinion based on what the patient says and what I see today compared to what Dr. Megan Salon likely saw two weeks ago. No fevers, chills, nausea, or vomiting noted at this time. 03/23/18 on evaluation today patient's wound actually appears to be showing signs of improvement which is good news. He is currently still on the Dapsone. He is also working on tapering the prednisone to get off of this and Dr. Lottie Rater is working with him in this regard. Nonetheless overall I feel like the wound is doing well it does appear based on the infectious disease note that I reviewed from Dr. 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 I did also review the results of his ultrasound of left lower extremity which revealed there was a dentist tissue in the base of the wound without an abscess noted. 04/06/18 on evaluation today the patient's left lateral lower extremity ulcer actually appears to be doing fairly well which is excellent news. There does not appear to be any evidence of infection at this time which is also great news. Overall he still does have a significantly large ulceration although little by little he seems to be making progress. He is down to 10 mg a day of the prednisone. 04/20/18 on evaluation today patient actually appears to be doing excellent at this time in regard to his left lower extremity ulcer. He's making signs of good progress unfortunately this is taking much longer than we would really like to see  but nonetheless he is making progress. Fortunately there does not appear to be any evidence of infection at this time. No  fevers, chills, nausea, or vomiting noted at this time. The patient has not been using the Santyl due to the cost he hadn't got in this field yet. He's mainly been using the antibiotic ointment topically. Subsequently he also tells me that he really has not been scrubbing in the shower I think this would be helpful again as I told him it doesn't have to be anything too aggressive to even make it believe just enough to keep it free of some of the loose slough and biofilm on the wound surface. 05/11/18 on evaluation today patient's wound appears to be making slow but sure progress in regard to the left lateral lower extremity ulcer. He is been tolerating the dressing changes without complication. Fortunately there does not appear to be any evidence of infection at this time. He is still just using triple antibiotic ointment along with clobetasol occasionally over the area. He never got the Santyl and really does not seem to intend to in my pinion. 06/01/18 on evaluation today patient appears to be doing a little better in regard to his left lateral lower extremity ulcer. He states that overall he does not feel like he is doing as well with the Dapsone as he did with the prednisone. Nonetheless he sees his dermatologist later today and is gonna talk to them about the possibility of going back on the prednisone. Overall again I believe that the wound would be better if you would utilize Santyl but he really does not seem to be interested in going back to the [place] at this point. He has been using triple antibiotic ointment. 06/15/18 on evaluation today patient's wound actually appears to be doing about the same at this point. Fortunately there is no signs of infection at this time. He has made slight improvements although he continues to not really want to clean the wound bed at this point. He states that he just doesn't mess with it he doesn't want to cause any problems with everything else he has going  on. He has been on medication, antibiotics as prescribed by his dermatologist, for a staff infection of his lower extremities which is really drying out now and looking much better he tells me. Fortunately there is no sign of overall infection. 06/29/18 on evaluation today patient appears to be doing well in regard to his left lateral lower surety ulcer all things considering. Fortunately his staff infection seems to be greatly improved compared to previous. He has no signs of infection and this is drying up quite nicely. He is still the doxycycline for this is no longer on cental, Dapsone, or any of the other medications. His dermatologist has recommended possibility of an infusion but right now he does not want to proceed with that. 07/13/18 on evaluation today patient appears to be doing about the same in regard to his left lateral lower surety ulcer. Fortunately there's no signs of infection at this time which is great news. Unfortunately he still builds up a significant amount of Slough/biofilm of the surface of the wound he still is not really cleaning this as he should be appropriately. Again I'm able to easily with saline and gauze remove the majority of this on the surface which if you would do this at home would likely be a dramatic improvement for him as far as getting the area to improve. Nonetheless overall I  still feel like he is making progress is just very slow. I think Santyl will be of benefit for him as well. Still he has not gotten this as of this point. 07/27/18 on evaluation today patient actually appears to be doing little worse in regards of the erythema around the periwound region of the wound he also tells me that he's been having more drainage currently compared to what he was experiencing last time I saw him. He states not quite as bad as what he had because this was infected previously but nonetheless is still appears to be doing poorly. Fortunately there is no evidence  of systemic infection at this point. The patient tells me that he is not going to be able to afford the Santyl. He is still waiting to hear about the infusion therapy with his dermatologist. Apparently she wants an updated colonoscopy first. 08/10/18 on evaluation today patient appears to be doing better in regard to his left lateral lower extremity ulcer. Fortunately he is showing signs of improvement in this regard he's actually been approved for Remicade infusion's as well although this has not been scheduled as of yet. Fortunately there's no signs of active infection at this time in regard to the wound although he is having some issues with infection of the right lower extremity is been seen as dermatologist for this. Fortunately they are definitely still working with him trying to keep things under control. [name], [name] ([id number]) 09/07/18 on evaluation today patient is actually doing rather well in regard to his left lateral lower extremity ulcer. He notes these actually having some hair grow back on his extremity which is something he has not seen in years. He also tells me that the pain is really not giving them any trouble at this time which is also good news overall she is very pleased with the progress he's using a combination of the mupirocin along with the probate is all mixed. 09/21/18 on evaluation today patient actually appears to be doing fairly well all things considered in regard to his looks from the ulcer. He's been tolerating the dressing changes without complication. Fortunately there's no signs of active infection at this time which is good news he is still on all antibiotics or prevention of the staff infection. He has been on prednisone for time although he states it is gonna contact his dermatologist and see if she put them on a short course due to some irritation that he has going on currently. Fortunately there's no evidence of any overall worsening this is going very slow  I think cental would be something that would be helpful for him although he states that $50 for tube is quite expensive. He therefore is not willing to get that at this point. 10/06/18 on evaluation today patient actually appears to be doing decently well in regard to his left lateral leg ulcer. He's been tolerating the dressing changes without complication. Fortunately there's no signs of active infection at this time. Overall I'm actually rather pleased with the progress he's making although it's slow he doesn't show any signs of infection and he does seem to be making some improvement. I do believe that he may need a switch up and dressings to try to help this to heal more appropriately and quickly. 10/19/18 on evaluation today patient actually appears to be doing better in regard to his left lateral lower extremity ulcer. This is shown signs of having much less Slough buildup at this point due to the fact  he has been using the Santyl. Obviously this is very good news. The overall size of the wound is not dramatically smaller but again the appearance is. 11/02/18 on evaluation today patient actually appears to be doing quite well in regard to his lower [place] ulcer. A lot of the skin around the ulcer is actually somewhat irritating at this point this seems to be more due to the dressing causing irritation from the adhesive that anything else. Fortunately there is no signs of active infection at this time. 11/24/18 on evaluation today patient appears to be doing a little worse in regard to his overall appearance of his lower extremity ulcer. There's more erythema and warmth around the wound unfortunately. He is currently on doxycycline which he has been on for some time. With that being said I'm not sure that seems to be helping with what appears to possibly be an acute cellulitis with regard to his left lower extremity ulcer. No fevers, chills, nausea, or vomiting noted at this time. 12/08/18 on  evaluation today patient's wounds actually appears to be doing significantly better compared to his last evaluation. He has been using Santyl along with alternating tripling about appointment as well as the steroid cream seems to be doing quite well and the wound is showing signs of improvement which is excellent news. Fortunately there's no evidence of infection and in fact his culture came back negative with only normal skin flora noted. 12/21/2018 upon evaluation today patient actually appears to be doing excellent with regard to his ulcer. This is actually the best that I have seen it since have been helping to take care of him. It is both smaller as well as less slough noted on the surface of the wound and seems to be showing signs of good improvement with new skin growing from the edges. He has been using just the triamcinolone he does wonder if he can get a refill of that ointment today. 01/04/2019 upon evaluation today patient actually appears to be doing well with regard to his left lateral lower extremity ulcer. With that being said it does not appear to be that he is doing quite as well as last time as far as progression is concerned. There does not appear to be any signs of infection or significant irritation which is good news. With that being said I do believe that he may benefit from switching to a collagen based dressing based on how clean The wound appears. 01/18/2019 on evaluation today patient actually appears to be doing well with regard to his wound on the left lower extremity. He is not made a lot of progress compared to where we were previous but nonetheless does seem to be doing okay at this time which is good news. There is no signs of active infection which is also good news. My only concern currently is I do wish we can get him into utilizing the collagen dressing his insurance would not pay for the supplies that we ordered although it appears that he may be able to order this  through his supply company that he typically utilizes. This is [employer]. Nonetheless he did try to order it during the office visit today and it appears this did go through. We will see if he can get that it is a different brand but nonetheless he has collagen and I do think will be beneficial. 02/01/2019 on evaluation today patient actually appears to be doing a little worse today in regard to the overall size of  his wounds. Fortunately there is no signs of active infection at this time. That is visually. Nonetheless when this is happened before it was due to infection. For that reason were somewhat concerned about that this time as well. 02/08/2019 on evaluation today patient unfortunately appears to be doing slightly worse with regard to his wound upon evaluation today. Is measuring a little deeper and a little larger unfortunately. I am not really sure exactly what is causing this to enlarge he actually did see his dermatologist she is going to see about initiating Humira for him. Subsequently she also did do steroid injections into the wound itself in the periphery. Nonetheless still nonetheless he seems to be getting a little bit larger he is gone back to just using the steroid cream topically which I think is appropriate. I would say hold off on the collagen for the time being is definitely a good thing to do. Based on the culture results which we finally did get the final result back regarding it shows staph as the bacteria noted again that can be a normal skin bacteria based on the fact however he is having increased drainage and worsening of the wound measurement wise I would go ahead and place him on an antibiotic today I do believe for this. 02/15/2019 on evaluation today patient actually appears to be doing somewhat better in regard to his ulcer. There is no signs of worsening at this time I did review his culture results which showed evidence of Staphylococcus aureus but not MRSA. Again  this could just be more related to the normal skin bacteria although he states the drainage has slowed down quite a bit he may have had a mild infection not just colonization. And was much smaller and then since around10/04/2019 on evaluation today patient appears to be doing unfortunately worse as far as the size of the wound. I really feel like that this is steadily getting larger again it had been doing excellent right at the beginning of September we have seen a steady increase in the area of the wound it is almost 2-1/2 times the size it was on September 1. Obviously this is a bad trend this is not wanting to see. For that reason we went back to using just the topical triamcinolone cream which does seem to help with inflammation. I checked him for bacteria by way of culture and nothing showed positive there. I am considering giving him a short course of a tapering steroid Dosepak today to see if that is can be beneficial for him. The patient is in agreement with giving that a try. 03/08/2019 on evaluation today patient appears to be doing very well in comparison to last evaluation with regard to his lower extremity ulcer. This is showing signs of less inflammation and actually measuring slightly smaller compared to last time every other week over the past month and a half he has been measuring larger larger larger. Nonetheless I do believe that the issue has been inflammation the prednisone does seem to [facility] ([id number]) have been beneficial for him which is good news. No fevers, chills, nausea, vomiting, or diarrhea. 03/22/2019 on evaluation today patient appears to be doing about the same with regard to his leg ulcer. He has been tolerating the dressing changes without complication. With that being said the wound seems to be mostly arrested at its current size but really is not making any progress except for when we prescribed the prednisone. He did show some signs of  dropping as far as  the overall size of the wound during that interval week. Nonetheless this is something he is not on long-term at this point and unfortunately I think he is getting need either this or else the Humira which his dermatologist has discussed try to get approval for. With that being said he will be seeing his dermatologist on the 11th of this month that is November. 04/19/2019 on evaluation today patient appears to be doing really about the same the wound is measuring slightly larger compared to last time I saw him. He has not been into the office since November 2 due to the fact that he unfortunately had Covid as that his entire family. He tells me that it was rough but they did pull-through and he seems to be doing much better. Fortunately there is no signs of active infection at this time. No fevers, chills, nausea, vomiting, or diarrhea. 05/10/2019 on evaluation today patient unfortunately appears to be doing significantly worse as compared to last time I saw him. He does tell me that he has had his first dose of Humira and actually is scheduled to get the next one in the upcoming week. With that being said he tells me also that in the past several days he has been having a lot of issues with green drainage she showed me a picture this is more blue-green in color. He is also been having issues with increased sloughy buildup and the wound does appear to be larger today. Obviously this is not the direction that we want everything to take based on the starting of his Humira. Nonetheless I think this is definitely a result of likely infection and to be honest I think this is probably Pseudomonas causing the infection based on what I am seeing. 05/24/2019 on evaluation today patient unfortunately appears to be doing significantly worse compared to his prior evaluation with me 2 weeks ago. I did review his culture results which showed that he does have Staph aureus as well as Pseudomonas noted on the culture.  Nonetheless the Levaquin that I prescribed for him does not appear to have been appropriate and in fact he tells me he is no longer experiencing the green drainage and discharge that he had at the last visit. Fortunately there is no signs of active infection at this time which is good news although the wound has significantly worsened it in fact is much deeper than it was previous. We have been utilizing up to this point triamcinolone ointment as the prescription topical of choice but at this time I really feel like that the wound is getting need to be packed in order to appropriately manage this due to the deeper nature of the wound. Therefore something along the lines of an alginate dressing may be more appropriate. 05/31/2019 upon inspection today patient's wound actually showed signs of doing poorly at this point. Unfortunately he just does not seem to be making any good progress despite what we have tried. He actually did go ahead and pick up the Cipro and start taking that as he was noticing more green drainage he had previously completed the Levaquin that I prescribed for him as well. Nonetheless he missed his appointment for the seventh last week on Wednesday with the wound care center and [facility] where his dermatologist referred him. Obviously I do think a second opinion would be helpful at this point especially in light of the fact that the patient seems to be doing so  poorly despite the fact that we have tried everything that I really know how at this point. The only thing that ever seems to have helped him in the past is when he was on high doses of continual steroids that did seem to make a difference for him. 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 06/07/2019 upon evaluation today patient unfortunately appears to be doing  worse yet again with regard to his wound. In fact I am starting to question whether or not he may have a fluid pocket in the muscle at this point based on the bulging and the soft appearance to the central portion of the muscle area. There is not anything draining from the muscle itself at this time which is good news but nonetheless the wound is expanding. I am not really seeing any results of the Humira as far as overall wound progression based on what I am seeing at this point. The patient has been referred for second opinion with regard to his wound to the [facility] wound care center by his dermatologist which I definitely am not in opposition to. Unfortunately we tried multiple dressings in the past including collagen, alginate, and at one point even Hydrofera Blue. With that being said he is never really used it for any significant amount of time due to the fact that he often complains of pain associated with these dressings and then will go back to either using the Santyl which she has done intermittently or more frequently the triamcinolone. He is also using his own compression stockings. We have wrapped him in the past but again that was something else that he really was not a big fan of. 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 He seemed to do much better when he was on the steroids although honestly I understand that the steroids are not necessarily the best medication to be on long-term obviously 06/14/2019 on evaluation today patient appears to be doing actually a little bit better with regard to the overall appearance with his leg. Unfortunately he does continue to have  issues with what appears to be some fluid underneath the muscle although he did see the wound specialty center at [facility] last week their main goals were to see about infusion therapy in place of the Humira as they feel like that is not quite strong enough. They also recommended that we continue with the treatment otherwise as we are they felt like that was appropriate and they are okay with him continuing to follow-up here with [place] in that regard. With that being said they are also sending him to the vein specialist there to see about vein stripping and if that would be of benefit for him. Subsequently they also did not really address whether or not an ultrasound of the muscle area to see if there is anything that needs to be addressed here would be appropriate or not. For that reason I discussed this with him last  week I think we may proceed down that road at this point. 06/21/2019 upon evaluation today patient's wound actually appears to be doing slightly better compared to previous evaluations. I do believe that he has made a difference with regard to the progression here with the use of oral steroids. Again in the past has been the only thing that is really calm things down. He does tell me that from [facility] is gotten a good news from there that there are no further vein stripping that is necessary at this point. I do not have that available for review today although the patient did relay this to me. He also did obtain and have the ultrasound of the wound completed which I did sign off on today. It does appear that there is no fluid collection under the muscle this is likely then just edematous tissue in general. That is also good news. Overall I still believe the inflammation is the main issue here. He did inquire about the possibility of a wound VAC again with the muscle protruding like it is I am not really sure whether the wound VAC is necessarily ideal or not. That is something we will have to consider  although I do believe he may need compression wrapping to try to help with edema control which could potentially be of benefit. 06/28/2019 on evaluation today patient appears to be doing slightly better measurement wise although this is not terribly smaller he least seems to be trending towards that direction. With that being said he still seems to have purulent drainage noted in the wound bed at this time. He has been on Levaquin followed by Cipro over the past month. Unfortunately he still seems to have some issues with active infection at this time. I did perform a culture last week in order to evaluate and see if indeed there was still anything going on. Subsequently the culture did come back showing Pseudomonas which is consistent with the drainage has been having which is blue-green in color. He also has had an odor that again was somewhat consistent with Pseudomonas as well. Long story short it appears that the culture showed an intermediate finding with regard to how well the Cipro will work for the Pseudomonas infection. Subsequently being that he does not seem to be clearing up and at best what we are doing is just keeping this at [facility] I think he may need to see infectious disease to discuss IV antibiotic options. [name], [name] ([id number]) 07/05/2019 upon evaluation today patient appears to be doing okay in regard to his leg ulcer. He has been tolerating the dressing changes at this point without complication. Fortunately there is no signs of active infection at this time which is good news. No fevers, chills, nausea, vomiting, or diarrhea. With that being said he does have an appointment with infectious disease tomorrow and his primary care on Wednesday. Again the reason for the infectious disease referral was due to the fact that he did not seem to be fully resolving with the use of oral antibiotics and therefore we were thinking that IV antibiotic therapy may be necessary secondary to the  fact that there was an intermediate finding for how effective the Cipro may be. Nonetheless again he has been having a lot of purulent and even green drainage. Fortunately right now that seems to have calmed down over the past week with the reinitiation of the oral antibiotic. Nonetheless we will see what Dr. Megan Salon has to say. 07/12/2019 upon evaluation today  patient appears to be doing about the same at this point in regard to his left lower extremity ulcer. Fortunately there is no signs of active infection at this time which is good news I do believe the Levaquin has been beneficial I did review Dr. Hale Bogus note and to be honest I agree that the patient's leg does appear to be doing better currently. What we found in the past as he does not seem to really completely resolve he will stop the antibiotic and then subsequently things will revert back to having issues with blue-green drainage, increased pain, and overall worsening in general. Obviously that is the reason I sent him back to infectious disease. 07/19/2019 upon evaluation today patient appears to be doing roughly the same in size there is really no dramatic improvement. He has started back on the Levaquin at this point and though he seems to be doing okay he did still have a lot of blue/green drainage noted on evaluation today unfortunately. I think that this is still indicative more likely of a Pseudomonas infection as previously noted and again he does see Dr. Megan Salon in just a couple of days. I do not know that were really able to effectively clear this with just oral antibiotics alone based on what I am seeing currently. Nonetheless we are still continue to try to manage as best we can with regard to the patient and his wound. I do think the wrap was helpful in decreasing the edema which is excellent news. No fevers, chills, nausea, vomiting, or diarrhea. 07/26/2019 upon evaluation today patient appears to be doing slightly better with  regard to the overall appearance of the muscle there is no dark discoloration centrally. Fortunately there is no signs of active infection at this time. No fevers, chills, nausea, vomiting, or diarrhea. Patient's wound bed currently the patient did have an appointment with Dr. Megan Salon at infectious disease last week. With that being said Dr. Megan Salon the patient states was still somewhat hesitant about put him on any IV antibiotics he wanted [place] to repeat cultures today and then see where things go going forward. He does look like Dr. Megan Salon because of some improvement the patient did have with the Levaquin wanted [place] to see about repeating cultures. If it indeed grows the Pseudomonas again then he recommended a possibility of considering a PICC line placement and IV antibiotic therapy. He plans to see the patient back in 1 to 2 weeks. 08/02/2019 upon evaluation today patient appears to be doing poorly with regard to his left lower extremity. We did get the results of his culture back it shows that he is still showing evidence of Pseudomonas which is consistent with the purulent/blue-green drainage that he has currently. Subsequently the culture also shows that he now is showing resistance to the oral fluoroquinolones which is unfortunate as that was really the only thing to treat the infection prior. I do believe that he is looking like this is going require IV antibiotic therapy to get this under control. Fortunately there is no signs of systemic infection at this time which is good news. The patient does see Dr. Megan Salon tomorrow. 08/09/2019 upon evaluation today patient appears to be doing better with regard to his left lower extremity ulcer in regard to the overall appearance. He is currently on IV antibiotic therapy. As ordered by Dr. Megan Salon. Currently the patient is on ceftazidime which she is going to take for the next 2 weeks and then follow-up for 4 to 5-week  appointment with Dr. Megan Salon.  The patient started this this past Friday symptoms have not for a total of 3 days currently in full. 08/16/2019 upon evaluation today patient's wound actually does show muscle in the base of the wound but in general does appear to be much better as far as the overall evidence of infection is concerned. In fact I feel like this is for the most part cleared up he still on the IV antibiotics he has not completed the full course yet but I think he is doing much better which is excellent news. 08/23/2019 upon evaluation today patient appears to be doing about the same with regard to his wound at this point. He tells me that he still has pain unfortunately. Fortunately there is no evidence of systemic infection at this time which is great news. There is significant muscle protrusion. 09/13/19 upon evaluation today patient appears to be doing about the same in regard to his leg unfortunately. He still has a lot of drainage coming from the ulceration there is still muscle exposed. With that being said the patient's last wound culture still showed an intermediate finding with regard to the Pseudomonas he still having the bluish/green drainage as well. Overall I do not know that the wound has completely cleared of infection at this point. Fortunately there is no signs of active infection systemically at this point which is good news. 09/20/2019 upon evaluation today patient's wound actually appears to be doing about the same based on what I am seeing currently. I do not see any signs of systemic infection he still does have evidence of some local infection and drainage. He did see Dr. Megan Salon last week and Dr. Megan Salon states that he probably does need a different IV antibiotic although he does not want to put him on this until the patient begins the Remicade infusion which is actually scheduled for about 10 days out from today on 13 May. Following that time Dr. Megan Salon is good to see him back and then will  evaluate the feasibility of starting him on the IV antibiotic therapy once again at that point. I do not disagree with this plan I do believe as Dr. Megan Salon stated in his note that I reviewed today that the patient's issue is multifactorial with the pyoderma being 1 aspect of this that were hoping the Remicade will be helpful for her. In the meantime I think the gentamicin is, helping to keep things under decent okay control in regard to the ulcer. 09/27/2019 upon evaluation today patient appears to be doing about the same with regard to his wound still there is a lot of muscle exposure though he does have some hyper granulation tissue noted around the edge and actually some granulation tissue starting to form over the muscle which is actually good news. Fortunately there is no evidence of active infection which is also good news. His pain is less at this point. 5/21; this is a patient I have not seen in a long time. 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 He has a large wound on the left lateral leg with protruding muscle. He comes in the clinic today showing the same area on his left medial ankle. He says there is been a spot there for some time although we have not previously defined this. Today he has a clearly defined area with slight amount of skin breakdown surrounded by raised areas with a purplish hue in color. This is not painful he  says it is irritated. 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 He still has muscle protruding from the lateral portion of his left leg but fortunately the new area that they were concerned about at his last visit does not appear to have opened at this point. He is currently on Remicade infusions and seems to be doing better in my opinion in fact the wound itself seems to be overall much better. The purplish  discoloration that he did have seems to have resolved and I think that is a good sign that hopefully the Remicade is doing its job. He does have some biofilm noted over the surface of the wound. 11/01/2019 on evaluation today patient's wound actually appears to be doing excellent at this time. Fortunately there is no evidence of active infection and overall I feel like he is making great progress. The Remicade seems to be due excellent job in my opinion. [name], [name] ([id number]) 11/08/19 evaluation today vision actually appears to be doing quite well with regard to his weight ulcer. He's been tolerating dressing changes without complication. Fortunately there is no evidence of infection. No fevers, chills, nausea, or vomiting noted at this time. Overall states that is having more itching than pain which is actually a good sign in my opinion. 12/13/2019 upon evaluation today patient appears to be doing well today with regard to his wound. He has been tolerating the dressing changes without complication. Fortunately there is no sign of active infection at this time. No fevers, chills, nausea, vomiting, or diarrhea. Overall I feel like the infusion therapy has been very beneficial for him. 01/06/2020 on evaluation today patient appears to be doing well with regard to his wound. This is measuring smaller and actually looks to be doing better. Fortunately there is no signs of active infection at this point. No fevers, chills, nausea, vomiting, or diarrhea. With that being said he does still have the blue-green drainage but this does not seem to be causing any significant issues currently. He has been using the gentamicin that does seem to be keeping things under decent control at this point. He goes later this morning for his next infusion therapy for the pyoderma which seems to also be very beneficial. 02/07/2020 on evaluation today patient appears to be doing about the same in regard to his wounds  currently. Fortunately there is no signs of active infection systemically he does still have evidence of local infection still using gentamicin. He also is showing some signs of improvement albeit slowly I do feel like we are making some progress here. 02/21/2020 upon evaluation today patient appears to be making some signs of improvement the wound is measuring a little bit smaller which is great news and overall I am very pleased with where he stands currently. He is going to be having infusion therapy treatment on the 15th of this month. Fortunately there is no signs of active infection at this time. 03/13/2020 I do believe patient's wound is actually showing some signs of improvement here which is great news. He has continue with the infusion therapy through rheumatology/dermatology at [facility]. That does seem to be beneficial. I still think he gets as much benefit from this as he did from the prednisone initially but nonetheless obviously this is less harsh on his body that the prednisone as far as they are concerned. 03/31/2020 on evaluation today patient's wound actually showing signs of some pretty  good improvement in regard to the overall appearance of the wound bed. There is still muscle exposed though he does have some epithelial growth around the edges of the wound. Fortunately there is no signs of active infection at this time. No fevers, chills, nausea, vomiting, or diarrhea. 04/24/2020 upon evaluation today patient appears to be doing about the same in regard to his leg ulcer. He has been tolerating the dressing changes without complication. Fortunately there is no signs of active infection at this time. No fevers, chills, nausea, vomiting, or diarrhea. With that being said he still has a lot of irritation from the bandaging around the edges of the wound. We did discuss today the possibility of a referral to plastic surgery. 05/22/2020 on evaluation today patient appears to be doing well with  regard to his wounds all things considered. He has not been able to get the Chantix apparently there is a recall nurse that I was unaware of put out by [employer] involuntarily. Nonetheless for now I am and I have to do some research into what may be the best option for him to help with quitting in regard to smoking and we discussed that today. 06/26/2020 upon evaluation today patient appears to be doing well with regard to his wound from the standpoint of infection I do not see any signs of infection at this point. With that being said unfortunately he is still continuing to have issues with muscle exposure and again he is not having a whole lot of new skin growth unfortunately. There does not appear to be any signs of active infection at this time. No fevers, chills, nausea, vomiting, or diarrhea. 07/10/2020 upon evaluation today patient appears to be doing a little bit more poorly currently compared to where he was previous. I am concerned currently about an active infection that may be getting worse especially in light of the increased size and tenderness of the wound bed. No fevers, chills, nausea, vomiting, or diarrhea. 07/24/2020 upon evaluation today patient appears to be doing poorly in regard to his leg ulcer. He has been tolerating the dressing changes without complication but unfortunately is having a lot of discomfort. 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 Subsequently I think he is going require possibly IV antibiotics to get this under control. I am very concerned about the severity of his infection and the amount of discomfort he is having. 07/31/2020 upon evaluation today patient appears to be doing about the same in regard to his leg wound. He did see Dr. Megan Salon and Dr. Megan Salon is actually going to start him on IV antibiotics. He goes for the PICC line tomorrow. With that being said there do not have that run for  2 weeks and then see how things are doing and depending on how he is progressing they may extend that a little longer. Nonetheless I am glad this is getting ready to be in place and definitely feel it may help the patient. In the meantime is been using mainly triamcinolone to the wound bed has an anti-inflammatory. 08/07/2020 on evaluation today patient appears to be doing well with regard to his wound compared even last week. In the interim he has gotten the PICC line placed and overall this seems to be doing excellent. There does not appear to be any evidence of infection which is great news systemically although locally of course has had the infection this appears to be improving with the use of  the antibiotics. 08/14/2020 upon evaluation today patient's wound actually showing signs of excellent improvement. Overall the irritation has significantly improved the drainage is back down to more of a normal level and his pain is really pretty much nonexistent compared to what it was. Obviously I think that this is significantly improved secondary to the IV antibiotic therapy which has made all the difference in the world. Again he had a resistant form of Pseudomonas for which oral antibiotics just was not cutting it. Nonetheless I do think that still we need to consider the possibility of a surgical closure for this wound is been open so long and to be honest with muscle exposed I think this can be very hard to get this to close outside of this although definitely were still working to try to do what we can in that regard. 08/21/2020 upon evaluation today patient appears to be doing very well with regard to his wounds on the left lateral lower extremity/calf area. Fortunately there does not appear to be signs of active infection which is great news and overall very pleased with where things stand today. He is actually wrapping up his treatment with IV antibiotics tomorrow. After that we will see where  things go from there. 08/28/2020 upon evaluation today patient appears to be doing decently well with regard to his leg ulcer. There does not appear to be any signs of active infection which is great news and overall very pleased with where things stand today. No fevers, chills, nausea, vomiting, or diarrhea. 09/18/2020 upon evaluation today patient appears to be doing well with regard to his infection which I feel like is better. Unfortunately he is not doing as well with regard to the overall size of the wound which is not nearly as good at this point. I feel like that he may be having an issue here with the pyoderma being somewhat out of control. I think that he may benefit from potentially going back and talking to the dermatologist about ILIA, ENGELBERT ([id number]) what to do from the pyoderma standpoint. I am not certain if the infusions are helping nearly as much is what the prednisone did in the past. 10/02/2020 upon evaluation today patient appears to be doing well with regard to his leg ulcer. He did go to the [facility] nurse. Unfortunately they feel like there is a 10% chance that most that he would be able to heal and that the skin graft would take. Obviously this has led him to not be able to go down that path as far as treatment is concerned. Nonetheless he does seem to be doing a little bit better with the prednisone that I gave him last time. I think that he may need to discuss with dermatology the possibility of long-term prednisone as that seems to be what is most helpful for him to be perfectly honest. I am not sure the Remicade is really doing the job. 10/17/2020 upon evaluation today patient appears to be doing a little better in regard to his wound. In fact the case has been since we did the prednisone on May 2 for him that we have noticed a little bit of improvement each time we have seen a size wise as well as appearance wise as well as pain wise. I think the prednisone has had a  greater effect then the infusion therapy has to be perfectly honest. With that being said the patient also feels significantly better compared to what he was previous. All of this  is good news but nonetheless I am still concerned about the fact that again we are really not set up to long-term manage him as far as prednisone is concerned. Obviously there are things that you need to be watched I completely understand the risk of prednisone usage as well. 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 With all that being said I do believe that we can give him another round of the prednisone which she is requesting today because of the improvement that he seen since we did that first round. 10/30/2020 upon evaluation today patient's wound actually is showing signs of doing quite well. There does not appear to be any evidence of infection which is great news and overall very pleased with where things stand today. No fevers, chills, nausea, vomiting, or diarrhea. He tells me that the prednisone still has seem to have helped he wonders if we can extend that for just a little bit longer. He did not have the appointment with a dermatologist although he did have an infusion appointment last Friday. That was at [facility]. With that being said he tells me he could not do both that as well as the appointment with the physician on the same day therefore that is can have to be rescheduled. I really want to see if there is anything they feel like that could be done differently to try to help this out as I am not really certain that the infusions are helping significantly here. 11/13/2020 upon evaluation today patient unfortunately appears to be doing somewhat poorly in regard to his wound I feel like this is actually worsening from the standpoint of the pyoderma spreading. I still feel like that he may need something different as far as trying to manage this going forward. Again we  did the prednisone unfortunately his blood sugars are not doing so well because of this. Nonetheless I believe that the patient likely needs to try topical steroid. We have done triamcinolone for a while I think going with something stronger such as clobetasol could be beneficial again this is not something I do lightly I discussed this with the patient that again this does not normally put underneath an occlusive dressing. Nonetheless I think a thin film as such could help with some of the stronger anti-inflammatory effects. We discussed this today. He would like to try to give this a trial for the next couple weeks. I definitely think that is something that we can do. Evaluate7/03/2021 and today patient's wound bed actually showed signs of doing really about the same. There was a little expansion of the size of the wound and that leading edge that we done looking out although the clobetasol does seem to have slowed this down a bit in my opinion. There is just 1 small area that still seems to be progressing based on what I see. Nonetheless I am concerned about the fact this does not seem to be improving if anything seems to be doing a little bit worse. I do not know that the infusions are really helping him much as next infusion is August 5 his appointment with dermatology is July 25. Either way I really think that we need to have a conversation potentially about this and I am actually going to see if I can talk with Dr. Lillia Carmel in order to see where things stand as well. 12/11/2020 upon evaluation today patient appears to be doing worse in regard to  his leg ulcer. Unfortunately I just do not think this is making the progress that I would like to see at this point. Honestly he does have an appointment with dermatology and this is in 2 days. I am wondering what they may have to offer to help with this. Right now what I am seeing is that he is continuing to show signs of worsening little by little.  Obviously that is not great at all. Is the exact opposite of what we are looking for. 12/18/2020 upon evaluation today patient appears to be doing a little better in regard to his wound. The dermatologist actually did do some steroid injections into the wound which does seem to have been beneficial in my opinion. That was on the 25th already this looks a little better to me than last time I saw him. With that being said we did do a culture and this did show that he has Staph aureus noted in abundance in the wound. With that being said I do think that getting him on an oral antibiotic would be appropriate as well. Also think we can compression wrap and this will make a difference as well. 12/28/2020 upon evaluation today patient's wound is actually showing signs of doing much better. I do believe the compression wrap is helping he has a lot of drainage but to be honest I think that the compression is helping to some degree in this regard as well as not draining through which is also good news. No fevers, chills, nausea, vomiting, or diarrhea. 01/04/2021 upon evaluation today patient appears to be doing well with regard to his wound. Overall things seem to be doing quite well. He did have a little bit of reaction to the CarboFlex Sorbact he will be using that any longer. With that being said he is controlled as far as the drainage is concerned overall and seems to be doing quite well. I do not see any signs of active infection at this time which is great news. No fevers, chills, nausea, vomiting, or diarrhea. 01/11/2021 upon evaluation today patient appears to be doing well with regard to his wounds. He has been tolerating the dressing changes without complication. Fortunately there does not appear to be any signs of active infection at this time which is great news. Overall I am extremely pleased with where we stand currently. No fevers, chills, nausea, vomiting, or diarrhea. Where using clobetasol in the  wound bed he has a lot of new skin growth which is awesome as well. 01/18/2021 upon evaluation today patient appears to be doing very well in regard to his leg ulcer. He has been tolerating the dressing changes without complication. Fortunately there does not appear to be any signs of active infection which is great news. In general I think that he is making excellent progress 01/25/2021 upon evaluation today patient appears to be doing well with regard to his wound on the leg. I am actually extremely pleased with where things stand today. There does not appear to be any signs of active infection which is great news and overall I think that we are definitely headed in the appropriate direction based on what I am seeing currently. There does not appear to be any signs of active infection also excellent news. 02/06/2021 upon evaluation today patient appears to be doing well with regard to his wound. Overall visually this is showing signs of significant improvement which is great news. I do not see any signs of active infection systemically which  is great even locally I do not think that we are seeing any major complications here. We did do fluorescence imaging with the [employer] today. The patient does have some odor and drainage noted and again this is something that I think would benefit him to probably come more frequently for nurse visits. 02/19/2021 upon evaluation today patient actually appears to be doing quite well in regard to his wound. He has been tolerating the dressing Tesar, Nethan ([id number]) changes without complication and overall I think that this is making excellent progress. I do not see any evidence of active infection at this point which is great news as well. No fevers, chills, nausea, vomiting, or diarrhea. 10/10; wound is made nice progress healthy granulation with a nice rim of epithelialization which seems to be expanding even from last week he has a deeper area in the  inferior part of the more distal part of the wound with not quite as healthy as surface. This area will need to be followed. Using clobetasol and Hydrofera Blue 03/05/2021 upon evaluation today patient appears to be doing very well in regard to his leg ulcer. He has been tolerating dressing changes without complication. Fortunately there does not appear to be any signs of active infection which is great news and overall I am extremely pleased with where we stand currently. 03/12/2021 upon evaluation today patient appears to be doing well with regard to his wound in fact this is extremely extremely good based on what we are seeing today there does not appear to be any signs of active infection and overall I think that he is doing awesome from the standpoint of healing in general. I am extremely pleased with how things seem to be progressing with regard to this pyoderma. Clobetasol has done wonders for him. I think the compression wrapping has also been of great benefit. 03/19/2021 upon evaluation today patient appears to be doing well with regard to his wound. He is tolerating the dressing changes without complication. In fact I feel like that he is actually making excellent progress at this point based on what I am seeing. No fevers, chills, nausea, vomiting, or diarrhea. 03/26/2021 upon evaluation today patient appears to be doing well with regard to his wound. This again is measuring smaller and looking better. Again the progress is slow but nonetheless continual with what we have been seeing. I do believe that the current plan is doing awesome for him. 04/09/2021 upon evaluation today patient appears to be doing well with regard to his leg ulcer. This is showing signs of excellent improvement the muscle is completely closed over and there does not appear to be any evidence of inflammation at this point his drainage is significantly improved. Overall I think that he would be a good candidate for  looking into a skin substitute at this point as well. We will get a look into some approvals in that regard. Potentially TheraSkin as well as Apligraf could both be considered just depending on insurance coverage. 04/16/2021 upon evaluation today patient appears to be doing well at this point. He has been approved for the Apligraf which we could definitely order although I would like to try to get the TheraSkin approved if at all possible. I did fax notes into them today and I Georgina Peer try to give a call as well. Overall the wound appears to be doing decently well today. 04/20/2021 upon evaluation today patient actually appears to be doing quite well in regard to his wound with  that being said we are trying to see what we can do about speeding up the healing process. For that reason we did discuss the possibility of a skin substitute. We got the Apligraf approved. For that reason we will get a go ahead and see what we can do with the Apligraf at this point. I am still trying to get the TheraSkin approved but I have not heard anything from the insurance company yet I have called and talked to them earlier in the week and to be honest this was about half an hour that I spent on the phone they told me I would hear something within 10-15 business days. 04/27/2021 upon evaluation today patient appears to be doing excellent in regard to his wound. I do believe the Apligraf has been beneficial. With that being said he has had a little bit of increased pain has been a little bit concerned. I do want to go ahead and apply his steroid cream today the clobetasol and then put the Apligraf over I just do not think I want to risk not putting the clobetasol on based on what is been going on here currently. Patient voiced understanding. 05/04/2021 upon evaluation today patient is making excellent improvement. Overall there is definitely decrease in the size of the wound today and I am very pleased in that regard. I do not  see any signs of active infection locally nor systemically at this point. No fevers, chills, nausea, vomiting, or diarrhea. 05/11/2021 upon evaluation today patient appears to be doing well with regard to his wound. He has been tolerating Apligraf's without complication and is making excellent progress this is application #4 today. 12/30; patient here for Apligraf application. Appears to be doing well small wound there has been a lot of healing 05/28/2021 upon evaluation today patient appears to be doing well with regard to his wound. Has been tolerating the dressing changes without complication. Fortunately I do not see any signs of active infection locally nor systemically at this point which is great news. He is done with the Apligraf and the first round and overall this has filled in quite nicely I am not even certain he needs any additional at the moment. I would actually try to go back to clobetasol with [facility] which previously was doing well for him. 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 He is using clobetasol and Hydrofera Blue. Surface area of the wound is smaller and the surface looks very healthy. 06/11/2021 upon evaluation today patient actually appears to be showing signs of good improvement which is great news. Fortunately I do not see any signs of active infection at this time which is also excellent news. I do believe that the patient is doing well with the clobetasol and Hydrofera Blue. He does have some irritation and itching around the rest of the leg will use some triamcinolone over this area. 06/18/2021 upon evaluation today patient appears to be doing well with regard to his wound. He is showing signs of excellent improvement and overall I am extremely pleased with where we stand today. We are moving in the right direction. 06/25/2021 upon evaluation today patient appears to be doing well with regard to his wound. In fact  this is doing also not showing signs of excellent improvement overall very pleased with where we stand today. I do not see any evidence of active infection locally or systemically at this time which is also great news. 07/02/2021  upon evaluation patient's wound bed actually showed signs of good granulation and epithelization at this point. And this is measuring significantly smaller and overall I am extremely pleased with where we stand today. No fevers, chills, nausea, vomiting, or diarrhea. 07/09/2021 upon evaluation patient's wound bed actually showed signs of good granulation and epithelization at this point. Fortunately there does not appear to be any evidence of active infection locally or systemically which is great news and overall I am extremely pleased with where we stand at this point. 07/16/2021 upon evaluation today patient appears to be doing well with regard to his wound all things considered although it is maybe a little bit larger than what its been. This does have me a little concerned. Actually question about whether anything is changed and initially he told me COTTON, BECKLEY ([id number]) know. In fact it was not until the end of the visit that he actually did mention that he had missed his infusion in January. This very well may be what is because the difference is also seeing currently. That definitely has seem to been helping more recently. 07/23/2021 upon evaluation today patient's wound is yet again larger despite the switch to a collagen which was not beneficial. Subsequently I am going to at this point check on a couple things. First and foremost I do want to go ahead and do a culture. I think that there is a chance he could have a low-lying infection that may be part of the issue here. Subsequently I am also probably can go ahead and place him on Bactrim DS which I think is a good option and has done well with them in the past. Again if this is an infection that should hopefully  help to turn things around. With all that being said I do believe that the patient should hopefully be able to turn this around with reinitiation of the infusion therapy which will be going on this week and subsequently getting on the antibiotic. 3/13; patient presents for follow-up. He has no issues or complaints today. He states he is currently on oral antibiotics for previous culture done in the office. 08/06/2021 upon evaluation today patient appears to be doing well with regard to his wound. I am actually seeing signs of improvement which is great news. I think that he is on a better track he did receive his infusion as well which is great news and overall I think that this should hopefully get him back on track as far as healing is concerned. He is not having any pain and I do not see any signs of inflammation which is great news. 08/13/2021 upon evaluation today patient appears to be doing excellent in regard to his wound on the leg. He has been tolerating the dressing changes without complication. Fortunately I do not see any signs of infection I do believe he is making good progress here and I think her back on track overall the tissue is greatly improved compared to what we have been. 08/20/2021 upon evaluation today patient appears to be doing excellent in regard to his wound. He has been tolerating the dressing changes without complication. Fortunately I do not see any evidence of infection at this time locally or systemically which is great news and overall very pleased with where we stand. 08-31-2021 upon evaluation today patient appears to be doing excellent in regard to his wound. Overall even with the vacation and extra walking he seems to have really done well. Fortunately I do not see  any evidence of active infection locally or systemically which is great news and overall I think we are headed in the right direction. 09-10-2021 upon evaluation today patient's wound is showing signs of  improvement. This is a small improvement but nonetheless we seem to be making progress here. I am very pleased in that regard. There does not appear to be any signs of infection and overall things are doing quite nicely. Upon inspection patient's wound bed actually showed signs of good granulation and epithelization at this point. There was some irritation around the edges of the wound where the good skin was it almost appears that something was rubbing or else is was due from drainage and irritation. Nonetheless I am going to see about putting a little bit of zinc just around the wound opening in order to protect the skin from being damaged. The patient is in agreement with that plan. 5/8; its been a while since I have seen this wound and it looks considerably better although still with some depth. Per our intake nurse the dimensions have improved patient has been using a thin layer of clobetasol, Hydrofera Blue under 4-layer compression. He appears to be making progress 10-01-2021 upon evaluation today patient appears to be doing well with regard to his wound although it is somewhat stalled I feel like were basically at a standstill here. He has previously had Apligraf which did very well for him to be honest. I do believe that he may benefit from a repeat of the Apligraf. In fact it got so small at that point but I do not think we even used all of the applications that were recommended as this had improved to such a degree. Nonetheless I do believe that the patient currently could benefit from repeat treatment with the Apligraf due to the improvement this that we did see. He is not opposed to this and subsequently working to look into getting approval to reinstitute that treatment. 10-08-2021 upon evaluation today patient appears to be doing well with regard to his wound. Overall I feel like he is doing a little bit better from the standpoint of irritation at this time which is great news. I do not  see any signs of active infection locally or systemically which is great news as well. No fevers, chills, nausea, vomiting, or diarrhea. 10-12-2021 upon evaluation today patient appears to be doing well with regard to his wound stable although we are can initiate treatment with Apligraf today. I am hoping this will stimulate things to improve. Patient is in agreement with that plan. 10-19-2028 upon evaluation today patient appears to be doing well with regard to his wound. He has been tolerating the dressing changes without complication. Fortunately there does not appear to be any evidence of active infection locally or systemically at this time which is great news. No fevers, chills, nausea, vomiting, or diarrhea. I do believe the Apligraf has improved the overall appearance of the wound bed receiving some definite improvements here. 10-26-2021 upon evaluation today patient's wound is actually shown some signs of really good improvement I am very pleased in this regard. There is a little bit of slough and biofilm buildup that is stuck to the surface of the wound I am getting very lightly debrided in order to clear this away and then subsequently will get a continue with the Apligraf application today. This is #3. 11-09-2021 upon evaluation today patient appears to be doing well currently in regard to his wound. This is actually showing  signs of significant improvement which is great news and overall I think the Apligraf is doing an amazing job. This wound is significantly smaller compared to the last time I saw him. This will be Apligraf application #4. [id number] upon evaluation today patient appears to be doing well currently in regard to his wound he is actually showing signs of excellent improvement which is great news and overall I am extremely pleased with where we stand today. I do not see any evidence of active infection locally or systemically at this time which is great news. No fevers, chills,  nausea, vomiting, or diarrhea. I believe the Apligraf is doing an awesome job and in fact I [place] see about getting an extension of application which hopefully should not be a problem. Especially in light of how much improvement he has had with regards to the size of the wound with treatment. This is application #5 today. 12-07-2021 upon evaluation today patient appears to be doing excellent in regard to his wound has been tolerating the dressing changes without complication. Fortunately there does not appear to be any signs of active infection locally or systemically at this time. 12-21-2021 upon evaluation today patient appears to be doing well currently in regard to his wound. We have gotten approval for a repeat Apligraf treatment. I think that is going to be a good way to wrap this up I really feel like we will likely be able to get the wound closed with the additional Gradilla, Eissa ([id number]) treatments that have been improved. 01-04-2022 upon evaluation patient appears to be doing excellent in regard to his wound. He is tolerating the dressing changes without complication and is measuring much better today as well. I am very pleased with where we stand. I am not even certain we will get have to use all 5 of the reapproved Apligraf but we will see how things go right now I am extremely pleased with the progress that is been made. 01-18-2022 upon evaluation today patient appears to be doing well with regard to his wound is actually measuring significantly better which is great news. This is still open but very minimally. I do think that it is appropriate to continue with the Apligraf he has done so well with this. 02-01-2022 upon evaluation today patient actually appears to be doing well currently with regard to his wound in fact this appears to be pretty much completely healed. There is just a very pinpoint area that is leaking a little clear fluid otherwise this is doing quite well. I do not see  any evidence of active infection at this time. 02-08-2022 upon evaluation today patient appears to be doing well currently in regard to his wound in fact this is showing signs of complete resolution as of today. 02-15-2022 upon evaluation today patient continues to do well there is really no signs of active infection at this time. He is tolerating the dressing changes without complication and to be honest still appears to be closed which is absolutely awesome. I am hopeful we will be able to discharge him next week without any complication. 02-22-2022 patient's wound is still completely healed as of today. He seems to be doing awesome. I do not see any signs of infection right now. Electronic Signature(s) Signed: 02/22/2022 1:49:40 PM By: Worthy Keeler PA-C Entered By: Worthy Keeler on 02/22/2022 13:49:40 ABDI, HUSAK ([id number]) -------------------------------------------------------------------------------- Physical Exam Details Patient Name: [name] Date of Service: 02/22/2022 8:15 AM Medical Record Number: [id number] Patient Account Number:  [id number] Date of Birth/Sex: 1980-08-19 (41 y.o. M) Treating RN: [name] Primary Care Provider: [name] Other Clinician: Massie Kluver Referring Provider: [name] Treating Provider/Extender: [name] Cliche in Treatment: 42 Constitutional Well-nourished and well-hydrated in no acute distress. Respiratory normal breathing without difficulty. Psychiatric this patient is able to make decisions and demonstrates good insight into disease process. Alert and Oriented x 3. pleasant and cooperative. Notes Again patient has complete epithelization this has remained closed over the past 2 weeks and as of today he is ready for discharge. Electronic Signature(s) Signed: 02/22/2022 1:49:50 PM By: Worthy Keeler PA-C Entered By: Worthy Keeler on 02/22/2022 13:49:50 [name]  ([id number]) -------------------------------------------------------------------------------- Physician Orders Details Patient Name: [name] Date of Service: 02/22/2022 8:15 AM Medical Record Number: [id number] Patient Account Number: [id number] Date of Birth/Sex: [birth year] (41 y.o. M) Treating RN: [name] Primary Care Provider: [name] Other Clinician: Massie Kluver Referring Provider: [name] Treating Provider/Extender: [name] Cliche in Treatment: 272 Verbal / Phone Orders: No Diagnosis Coding ICD-10 Coding Code Description I87.2 Venous insufficiency (chronic) (peripheral) L97.222 Non-pressure chronic ulcer of left calf with fat layer exposed E11.622 Type 2 diabetes mellitus with other skin ulcer L88 Pyoderma gangrenosum F17.208 Nicotine dependence, unspecified, with other nicotine-induced disorders [facility]charge From [facility] o Discharge from [facility] wound has healed! Please call the office if any further issues arise o Wear compression garments daily. Put garments on first thing when you wake up and remove them before bed. Bathing/ Shower/ Hygiene o Wash wounds with antibacterial soap and water. o May shower; gently cleanse wound with antibacterial soap, rinse and pat dry prior to dressing wounds - don't scrub area, use hand to wash gently Additional Orders / Instructions o Other: - use Triamcinolone cream to area x 2 weeks, then use AandD ointment to area apply at nighttime Electronic Signature(s) Signed: 02/22/2022 1:53:30 PM By: Worthy Keeler PA-C Signed: 02/25/2022 4:50:08 PM By: Massie Kluver Entered By: Massie Kluver on 02/22/2022 08:50:54 Awan, [name] ([id number]) -------------------------------------------------------------------------------- Problem List Details Patient Name: [name] Date of Service: 02/22/2022 8:15 AM Medical Record Number: [id number] Patient Account Number:  [id number] Date of Birth/Sex: 1980/06/10 (41 y.o. M) Treating RN: [name] Primary Care Provider: [name] Other Clinician: Massie Kluver Referring Provider: [name] Treating Provider/Extender: [name] Cliche in Treatment: 272 Active Problems ICD-10 Encounter Code Description Active Date MDM Diagnosis I87.2 Venous insufficiency (chronic) (peripheral) 12/04/2016 No Yes L97.222 Non-pressure chronic ulcer of left calf with fat layer exposed 12/04/2016 No Yes E11.622 Type 2 diabetes mellitus with other skin ulcer 04/09/2021 No Yes L88 Pyoderma gangrenosum 03/26/2017 No Yes F17.208 Nicotine dependence, unspecified, with other nicotine-induced disorders 04/24/2020 No Yes Inactive Problems ICD-10 Code Description Active Date Inactive Date L97.213 Non-pressure chronic ulcer of right calf with necrosis of muscle 04/02/2017 04/02/2017 Resolved Problems ICD-10 Code Description Active Date Resolved Date L97.321 Non-pressure chronic ulcer of left ankle limited to breakdown of skin 10/08/2019 10/08/2019 L03.116 Cellulitis of left lower limb 05/24/2019 05/24/2019 Electronic Signature(s) Signed: 02/22/2022 8:19:56 AM By: Worthy Keeler PA-C Entered By: Worthy Keeler on 02/22/2022 08:19:55 [name] ([id number]) -------------------------------------------------------------------------------- Progress Note Details Patient Name: [name] Date of Service: 02/22/2022 8:15 AM Medical Record Number: [id number] Patient Account Number: [id number] Date of Birth/Sex: 22-Sep-1980 (41 y.o. M) Treating RN: [name] Primary Care Provider: [name] Other Clinician: Massie Kluver Referring Provider: [name] Treating Provider/Extender: [name] Cliche in Treatment: 272 Subjective Chief Complaint Information  obtained from Patient He is here in follow up evaluation for LLE pyoderma ulcer History of Present Illness (HPI) 12/04/16; 41 year old man who comes into the  clinic today for review of a wound on the posterior left calf. He tells me that is been there for about a year. He is not a diabetic he does smoke half a pack per day. He was seen in the ER on 11/20/16 felt to have cellulitis around the wound and was given clindamycin. An x-ray did not show osteomyelitis. The patient initially tells me that he has a milk allergy that sets off a pruritic itching rash on his lower legs which she scratches incessantly and he thinks that's what may have set up the wound. He has been using various topical antibiotics and ointments without any effect. He works in a [employer]ing [employer] and is on his feet all day. He does not have a prior history of wounds however he does have the rash on both lower legs the right arm and the ventral aspect of his left arm. These are excoriations and clearly have had scratching however there are of macular looking areas on both legs including a substantial larger area on the right leg. This does not have an underlying open area. There is no blistering. The patient tells me that 2 years ago in [place] in response to the rash on his legs he saw a dermatologist who told him he had a condition which may be pyoderma gangrenosum although I may be putting words into his mouth. He seemed to recognize this. 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 He is not in any treatment for this. He's had no recent travel 12/11/16; the patient arrives today with his wound and roughly the same condition we've been using silver alginate this is a deep punched out wound with some surrounding erythema but no tenderness. Biopsy I did did not show confirmed pyoderma gangrenosum suggested nonspecific inflammation and vasculitis but does not provide an actual description of what was seen by the pathologist. I'm really not able to understand this We have also received information from the patient's dermatologist in [place] notes from April  2016. This was a doctor Agarwal-antal. 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 He was prescribed topical steroid high potency under occlusion which helped but at this point the patient did not have a deep punched out wound. 12/18/16; the patient's wound is larger in terms of surface area however this surface looks better and there is less depth. The surrounding erythema also is better. The patient states that the wrap we put on came off 2 days ago when he has been using his compression stockings. He we are in the process of getting a dermatology consult. 12/26/16 on evaluation today patient's left lower extremity wound shows evidence of infection with surrounding erythema noted. He has been tolerating the dressing changes but states that he has noted more discomfort. There is a larger area of erythema surrounding the wound. No fevers, chills, nausea, or vomiting noted at this time. With that being said the wound still does have slough covering the surface. He is not allergic to any medication that he is aware of at this point. In regard to his right lower extremity he had several regions that are erythematous and pruritic he wonders if there's anything we can do to help that. 01/02/17 I reviewed patient's wound culture which was obtained his visit last week. He was placed on  doxycycline at that point. Unfortunately that does not appear to be an antibiotic that would likely help with the situation however the pseudomonas noted on culture is sensitive to Cipro. Also unfortunately patient's wound seems to have a large compared to last week's evaluation. Not severely so but there are definitely increased measurements in general. He is continuing to have discomfort as well he writes this to be a seven out of 10. In fact he would prefer me not to perform any debridement today due to the fact that he is having discomfort and considering he has an active infection on the little reluctant to  do so anyway. No fevers, chills, nausea, or vomiting noted at this time. 01/08/17; patient seems dermatology on September 5. I suspect dermatology will want the slides from the biopsy I did sent to their pathologist. I'm not sure if there is a way we can expedite that. In any case the culture I did before I left on vacation 3 weeks ago showed Pseudomonas he was given 10 days of Cipro and per her description of her intake nurses is actually somewhat better this week although the wound is quite a bit bigger than I remember the last time I saw this. He still has 3 more days of Cipro 01/21/17; dermatology appointment tomorrow. He has completed the ciprofloxacin for Pseudomonas. Surface of the wound looks better however he is had some deterioration in the lesions on his right leg. Meantime the left lateral leg wound we will continue with sample 01/29/17; patient had his dermatology appointment but I can't yet see that note. He is completed his antibiotics. The wound is more superficial but considerably larger in circumferential area than when he came in. This is in his left lateral calf. He also has swollen erythematous areas with superficial wounds on the right leg and small papular areas on both arms. There apparently areas in her his upper thighs and buttocks I did not look at those. Dermatology biopsied the right leg. Hopefully will have their input next week. 02/05/17; patient went back to see his dermatologist who told him that he had a "scratching problem" as well as staph. He is now on a 30 day course of doxycycline and I believe she gave him triamcinolone cream to the right leg areas to help with the itching [not exactly sure but probably triamcinolone]. She apparently looked at the left lateral leg wound although this was not rebiopsied and I think felt to be ultimately part of the same pathogenesis. He is using sample border foam and changing nevus himself. He now has a new open area on the right  posterior leg which was his biopsy site I don't have any of the dermatology notes 02/12/17; we put the patient in compression last week with SANTYL to the wound on the left leg and the biopsy. Edema is much better and the depth of the wound is now at level of skin. Area is still the same Biopsy site on the right lateral leg we've also been using santyl with a border foam dressing and he is changing this himself. 02/19/17; Using silver alginate started last week to both the substantial left leg wound and the biopsy site on the right wound. He is tolerating compression well. Has a an appointment with his primary M.D. tomorrow wondering about diuretics although I'm wondering if the edema problem is actually lymphedema [name], [name] ([id number]) 02/26/17; the patient has been to see his primary doctor Dr. Jerrel Ivory at [facility] our primary care.  She started him on Lasix 20 mg and this seems to have helped with the edema. However we are not making substantial change with the left lateral calf wound and inflammation. The biopsy site on the right leg also looks stable but not really all that different. 03/12/17; the patient has been to see vein and vascular Dr. Lucky Cowboy. He has had venous reflux studies I have not reviewed these. I did get a call from his dermatology office. 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 They therefore recommended clobetasol and tetracycline i.e. aggressive treatment for possible pyoderma gangrenosum. 03/26/17; apparently the patient just had reflux studies not an appointment with Dr. dew. She arrives in clinic today having applied clobetasol for 2-3 weeks. He notes over the last 2-3 days excessive drainage having to  change the dressing 3-4 times a day and also expanding erythema. 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 Lillia Carmel at [facility] dermatology locally who kindly saw him at my request. 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 My concern was the degree of erythema medially extending from his left leg wound which was either inflammation from pyoderma or cellulitis. I put him on Augmentin however culture of the wound showed Pseudomonas which is quinolone sensitive. I really don't believe he has cellulitis however in view of everything I will continue and give him a course of Cipro. He is also on doxycycline as an immune modulator for the pyoderma. In addition to his original wound on the left lateral leg with surrounding erythema he has a wound on the right posterior calf which was an original biopsy site done by dermatology. 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 Saw Dr. Lillia Carmel yesterday. He has been using topical antibiotics to both wound areas his original wound on the left and the biopsies/pathergy area on the right. There is definitely some improvement in the inflammation around the wound on the right although the patient states he has increasing sensitivity of the wounds. He is on prednisone 60 and doxycycline 1 as prescribed by Dr. Lillia Carmel. He is covering the topical antibiotic with gauze and putting this in his own compression stocks and changing this daily. He states that Dr. Lottie Rater did a culture of the left leg wound yesterday 05/07/17; pyoderma gangrenosum. The patient saw Dr. Lillia Carmel yesterday and has a follow-up  with her in one month. He is still using topical antibiotics to both wounds although he can't recall exactly what type. He is still on prednisone 60 mg. Dr. Lillia Carmel stated that the doxycycline could stop if we were in agreement. 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 The right medial leg was induced by biopsy/pathergy. The area on the right is essentially healed. Still on high-dose prednisone using topical antibiotics to the wound 07/09/17; pyoderma gangrenosum with wounds  on the left lateral leg. The right medial leg has closed and remains closed. He is still on prednisone 60. He tells me he missed his last dermatology appointment with Dr. Lillia Carmel but will make another appointment. He reports that her blood sugar at a recent screen in [place] was high 200's. He was 180 today. 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 I think he is going to require still much longer prednisone perhaps another 3 months before attempting to taper. In the meantime his wound is a lot better. Smaller. He is cleaning this off daily and applying topical antibiotics. When he was last in the clinic I thought about changing to [facility] and actually put in a couple of calls to dermatology although probably not during their business hours. In any case the wound looks better smaller I don't think there is any need to change what he is doing 08/06/17-he is here in follow up evaluation for pyoderma left leg ulcer. He continues on oral prednisone. He has been using triple antibiotic ointment. There is surface debris and we will transition to [facility] and have him return in 2 weeks. He has lost 30 pounds since his last appointment with lifestyle modification. He may benefit from topical steroid cream for treatment this can be considered at a later date. 08/22/17 on evaluation today patient appears to actually be doing  rather well in regard to his left lateral lower extremity ulcer. He has actually been managed by Dr. Dellia Nims most recently. Patient is currently on oral steroids at this time. This seems to have been of benefit for him. Nonetheless his last visit was actually with Leah on 08/06/17. Currently he is not utilizing any topical steroid creams although this could be of benefit as well. No fevers, chills, nausea, or vomiting noted at this time. 09/05/17 on evaluation today patient appears to be doing better in regard to his left lateral lower extremity ulcer. He has been tolerating the dressing changes without complication. He is using Santyl with good effect. Overall I'm very pleased with how things are standing at this point. Patient likewise is happy that this is doing better. 09/19/17 on evaluation today patient actually appears to be doing rather well in regard to his left lateral lower extremity ulcer. Again this is secondary to Pyoderma gangrenosum and he seems to be progressing well with the Santyl which is good news. He's not having any significant pain. 10/03/17 on evaluation today patient appears to be doing excellent in regard to his lower extremity wound on the left secondary to Pyoderma gangrenosum. He has been tolerating the Santyl without complication and in general I feel like he's making good progress. 10/17/17 on evaluation today patient appears to be doing very well in regard to his left lateral lower surety ulcer. He has been tolerating the dressing changes without complication. There does not appear to be any evidence of infection he's alternating the Santyl and the triple antibiotic ointment every other day this seems to be doing well for him. 11/03/17 on evaluation today patient appears to be doing very well in regard to his left lateral lower extremity ulcer. He is been tolerating the dressing changes without complication which is good news. Fortunately there does not appear to be any  evidence of infection which is also great news. Overall is doing excellent they are starting to taper down on the prednisone is down to 40 mg at this point it also started topical clobetasol for him. 11/17/17 on evaluation today  patient appears to be doing well in regard to his left lateral lower surety ulcer. He's been tolerating the dressing changes without complication. He does note that he is having no pain, no excessive drainage or discharge, and overall he feels like things are going about how he would expect and hope they would. Overall he seems to have no evidence of infection at this time in my opinion which is good news. 12/04/17-He is seen in follow-up evaluation for right lateral lower extremity ulcer. He has been applying topical steroid cream. Today's measurement show slight increase in size. Over the next 2 weeks we will transition to every other day Santyl and steroid cream. He has been encouraged to monitor for changes and notify clinic with any concerns 12/15/17 on evaluation today patient's left lateral motion the ulcer and fortunately is doing worse again at this point. This just since last week to this week has close to doubled in size according to the patient. I did not seeing last week's I do not have a visual to compare this to in our system was also down so we do not have all the charts and at this point. Nonetheless it does have me somewhat concerned in regard to the fact that again he was worried enough about it he has contact the dermatology that placed them back on the full strength, 50 mg a day of the prednisone that he was taken previous. He continues to alternate using clobetasol along with Santyl at this point. He is obviously somewhat frustrated. ROSENDO, [name] ([id number]) 12/22/17 on evaluation today patient appears to be doing a little worse compared to last evaluation. Unfortunately the wound is a little deeper and slightly larger than the last week's evaluation. With  that being said he has made some progress in regard to the irritation surrounding at this time unfortunately despite that progress that's been made he still has a significant issue going on here. 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 For that reason I discussed with him today the possibility of perform a wound culture to ensure there's nothing overtly infected. 01/06/18 on evaluation today patient's wound is larger and deeper than previously evaluated. With that being said it did appear that his wound was infected after my last evaluation with him. Subsequently I did end up prescribing a prescription for Bactrim DS which she has been taking and having no complication with. Fortunately there does not appear to be any evidence of infection at this point in time as far as anything spreading, no want to touch, and overall I feel like things are showing signs of improvement. 01/13/18 on evaluation today patient appears to be even a little larger and deeper than last time. There still muscle exposed in the base of the wound. Nonetheless he does appear to be less erythematous I do believe inflammation is calming down also believe the infection looks like it's probably resolved at this time based on what I'm seeing. No fevers, chills, nausea, or vomiting noted at this time. 01/30/18 on evaluation today patient actually appears to visually look better for the most part. Unfortunately those visually this looks better he does seem to potentially have what may be an abscess in the muscle that has been noted in the central portion of the wound. This is the first time that I have noted what appears to be fluctuance in  the central portion of the muscle. With that being said I'm somewhat more concerned about the fact that this might indicate an abscess formation at  this location. I do believe that an ultrasound would be appropriate. This is likely something we need to try to do as soon as possible. He has been switch to mupirocin ointment and he is no longer using the steroid ointment as prescribed by dermatology he sees them again next week he's been decreased from 60 to 40 mg of prednisone. 03/09/18 on evaluation today patient actually appears to be doing a little better compared to last time I saw him. There's not as much erythema surrounding the wound itself. He I did review his most recent infectious disease note which was dated 02/24/18. He saw Dr. Michel Bickers in [place]. 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 Patient is now off of antibiotics and they are continually observing this. There seems to be no change in the past two weeks in my pinion based on what the patient says and what I see today compared to what Dr. Megan Salon likely saw two weeks ago. No fevers, chills, nausea, or vomiting noted at this time. 03/23/18 on evaluation today patient's wound actually appears to be showing signs of improvement which is good news. He is currently still on the Dapsone. He is also working on tapering the prednisone to get off of this and Dr. Lottie Rater is working with him in this regard. Nonetheless overall I feel like the wound is doing well it does appear based on the infectious disease note that I reviewed from Dr. 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 I did also review the results of his ultrasound of left lower extremity which revealed there was a dentist tissue in the base of the wound without an abscess noted. 04/06/18 on evaluation today the patient's left lateral lower extremity ulcer actually appears to be doing fairly well which is excellent  news. There does not appear to be any evidence of infection at this time which is also great news. Overall he still does have a significantly large ulceration although little by little he seems to be making progress. He is down to 10 mg a day of the prednisone. 04/20/18 on evaluation today patient actually appears to be doing excellent at this time in regard to his left lower extremity ulcer. He's making signs of good progress unfortunately this is taking much longer than we would really like to see but nonetheless he is making progress. Fortunately there does not appear to be any evidence of infection at this time. No fevers, chills, nausea, or vomiting noted at this time. The patient has not been using the Santyl due to the cost he hadn't got in this field yet. He's mainly been using the antibiotic ointment topically. Subsequently he also tells me that he really has not been scrubbing in the shower I think this would be helpful again as I told him it doesn't have to be anything too aggressive to even make it believe just enough to keep it free of some of the loose slough and biofilm on the wound surface. 05/11/18 on evaluation today patient's wound appears to be making slow but sure progress in regard to the left lateral lower extremity ulcer. He is  been tolerating the dressing changes without complication. Fortunately there does not appear to be any evidence of infection at this time. He is still just using triple antibiotic ointment along with clobetasol occasionally over the area. He never got the Santyl and really does not seem to intend to in my pinion. 06/01/18 on evaluation today patient appears to be doing a little better in regard to his left lateral lower extremity ulcer. He states that overall he does not feel like he is doing as well with the Dapsone as he did with the prednisone. Nonetheless he sees his dermatologist later today and is gonna talk to them about the possibility of going  back on the prednisone. Overall again I believe that the wound would be better if you would utilize Santyl but he really does not seem to be interested in going back to the [place] at this point. He has been using triple antibiotic ointment. 06/15/18 on evaluation today patient's wound actually appears to be doing about the same at this point. Fortunately there is no signs of infection at this time. He has made slight improvements although he continues to not really want to clean the wound bed at this point. He states that he just doesn't mess with it he doesn't want to cause any problems with everything else he has going on. He has been on medication, antibiotics as prescribed by his dermatologist, for a staff infection of his lower extremities which is really drying out now and looking much better he tells me. Fortunately there is no sign of overall infection. 06/29/18 on evaluation today patient appears to be doing well in regard to his left lateral lower surety ulcer all things considering. Fortunately his staff infection seems to be greatly improved compared to previous. He has no signs of infection and this is drying up quite nicely. He is still the doxycycline for this is no longer on cental, Dapsone, or any of the other medications. His dermatologist has recommended possibility of an infusion but right now he does not want to proceed with that. 07/13/18 on evaluation today patient appears to be doing about the same in regard to his left lateral lower surety ulcer. Fortunately there's no signs of infection at this time which is great news. Unfortunately he still builds up a significant amount of Slough/biofilm of the surface of the wound he still is not really cleaning this as he should be appropriately. Again I'm able to easily with saline and gauze remove the majority of this on the surface which if you would do this at home would likely be a dramatic improvement for him as far as getting the area  to improve. Nonetheless overall I still feel like he is making progress is just very slow. I think Santyl will be of benefit for him as well. Still he has not gotten this as of this point. 07/27/18 on evaluation today patient actually appears to be doing little worse in regards of the erythema around the periwound region of the wound he also tells me that he's been having more drainage currently compared to what he was experiencing last time I saw him. He states not quite as bad as what he had because this was infected previously but nonetheless is still appears to be doing poorly. Fortunately there is no evidence of systemic infection at this point. The patient tells me that he is not going to be able to afford the Santyl. He is still waiting to hear about the infusion therapy  with his dermatologist. Apparently she wants an updated colonoscopy first. [name], [name] ([id number]) 08/10/18 on evaluation today patient appears to be doing better in regard to his left lateral lower extremity ulcer. Fortunately he is showing signs of improvement in this regard he's actually been approved for Remicade infusion's as well although this has not been scheduled as of yet. Fortunately there's no signs of active infection at this time in regard to the wound although he is having some issues with infection of the right lower extremity is been seen as dermatologist for this. Fortunately they are definitely still working with him trying to keep things under control. 09/07/18 on evaluation today patient is actually doing rather well in regard to his left lateral lower extremity ulcer. He notes these actually having some hair grow back on his extremity which is something he has not seen in years. He also tells me that the pain is really not giving them any trouble at this time which is also good news overall she is very pleased with the progress he's using a combination of the mupirocin along with the probate is all  mixed. 09/21/18 on evaluation today patient actually appears to be doing fairly well all things considered in regard to his looks from the ulcer. He's been tolerating the dressing changes without complication. Fortunately there's no signs of active infection at this time which is good news he is still on all antibiotics or prevention of the staff infection. He has been on prednisone for time although he states it is gonna contact his dermatologist and see if she put them on a short course due to some irritation that he has going on currently. Fortunately there's no evidence of any overall worsening this is going very slow I think cental would be something that would be helpful for him although he states that $50 for tube is quite expensive. He therefore is not willing to get that at this point. 10/06/18 on evaluation today patient actually appears to be doing decently well in regard to his left lateral leg ulcer. He's been tolerating the dressing changes without complication. Fortunately there's no signs of active infection at this time. Overall I'm actually rather pleased with the progress he's making although it's slow he doesn't show any signs of infection and he does seem to be making some improvement. I do believe that he may need a switch up and dressings to try to help this to heal more appropriately and quickly. 10/19/18 on evaluation today patient actually appears to be doing better in regard to his left lateral lower extremity ulcer. This is shown signs of having much less Slough buildup at this point due to the fact he has been using the [employer]. Obviously this is very good news. The overall size of the wound is not dramatically smaller but again the appearance is. 11/02/18 on evaluation today patient actually appears to be doing quite well in regard to his lower [place] ulcer. A lot of the skin around the ulcer is actually somewhat irritating at this point this seems to be more due to the  dressing causing irritation from the adhesive that anything else. Fortunately there is no signs of active infection at this time. 11/24/18 on evaluation today patient appears to be doing a little worse in regard to his overall appearance of his lower extremity ulcer. There's more erythema and warmth around the wound unfortunately. He is currently on doxycycline which he has been on for some time. With that being said I'm  not sure that seems to be helping with what appears to possibly be an acute cellulitis with regard to his left lower extremity ulcer. No fevers, chills, nausea, or vomiting noted at this time. 12/08/18 on evaluation today patient's wounds actually appears to be doing significantly better compared to his last evaluation. He has been using Santyl along with alternating tripling about appointment as well as the steroid cream seems to be doing quite well and the wound is showing signs of improvement which is excellent news. Fortunately there's no evidence of infection and in fact his culture came back negative with only normal skin flora noted. 12/21/2018 upon evaluation today patient actually appears to be doing excellent with regard to his ulcer. This is actually the best that I have seen it since have been helping to take care of him. It is both smaller as well as less slough noted on the surface of the wound and seems to be showing signs of good improvement with new skin growing from the edges. He has been using just the triamcinolone he does wonder if he can get a refill of that ointment today. 01/04/2019 upon evaluation today patient actually appears to be doing well with regard to his left lateral lower extremity ulcer. With that being said it does not appear to be that he is doing quite as well as last time as far as progression is concerned. There does not appear to be any signs of infection or significant irritation which is good news. With that being said I do believe that he may  benefit from switching to a collagen based dressing based on how clean The wound appears. 01/18/2019 on evaluation today patient actually appears to be doing well with regard to his wound on the left lower extremity. He is not made a lot of progress compared to where we were previous but nonetheless does seem to be doing okay at this time which is good news. There is no signs of active infection which is also good news. My only concern currently is I do wish we can get him into utilizing the collagen dressing his insurance would not pay for the supplies that we ordered although it appears that he may be able to order this through his supply company that he typically utilizes. This is [employer]. Nonetheless he did try to order it during the office visit today and it appears this did go through. We will see if he can get that it is a different brand but nonetheless he has collagen and I do think will be beneficial. 02/01/2019 on evaluation today patient actually appears to be doing a little worse today in regard to the overall size of his wounds. Fortunately there is no signs of active infection at this time. That is visually. Nonetheless when this is happened before it was due to infection. For that reason were somewhat concerned about that this time as well. 02/08/2019 on evaluation today patient unfortunately appears to be doing slightly worse with regard to his wound upon evaluation today. Is measuring a little deeper and a little larger unfortunately. I am not really sure exactly what is causing this to enlarge he actually did see his dermatologist she is going to see about initiating Humira for him. Subsequently she also did do steroid injections into the wound itself in the periphery. Nonetheless still nonetheless he seems to be getting a little bit larger he is gone back to just using the steroid cream topically which I think is appropriate. I  would say hold off on the collagen for the time being is  definitely a good thing to do. Based on the culture results which we finally did get the final result back regarding it shows staph as the bacteria noted again that can be a normal skin bacteria based on the fact however he is having increased drainage and worsening of the wound measurement wise I would go ahead and place him on an antibiotic today I do believe for this. 02/15/2019 on evaluation today patient actually appears to be doing somewhat better in regard to his ulcer. There is no signs of worsening at this time I did review his culture results which showed evidence of Staphylococcus aureus but not MRSA. Again this could just be more related to the normal skin bacteria although he states the drainage has slowed down quite a bit he may have had a mild infection not just colonization. And was much smaller and then since around10/04/2019 on evaluation today patient appears to be doing unfortunately worse as far as the size of the wound. I really feel like that this is steadily getting larger again it had been doing excellent right at the beginning of September we have seen a steady increase in the area of the wound it is almost 2-1/2 times the size it was on September 1. Obviously this is a bad trend this is not wanting to see. For that reason we went back to using just the topical triamcinolone cream which does seem to help with inflammation. I checked him for bacteria by way of culture and nothing showed positive there. I am considering giving him a short course of a tapering steroid [name], [name] ([id number]) today to see if that is can be beneficial for him. The patient is in agreement with giving that a try. 03/08/2019 on evaluation today patient appears to be doing very well in comparison to last evaluation with regard to his lower extremity ulcer. This is showing signs of less inflammation and actually measuring slightly smaller compared to last time every other week over the past  month and a half he has been measuring larger larger larger. Nonetheless I do believe that the issue has been inflammation the prednisone does seem to have been beneficial for him which is good news. No fevers, chills, nausea, vomiting, or diarrhea. 03/22/2019 on evaluation today patient appears to be doing about the same with regard to his leg ulcer. He has been tolerating the dressing changes without complication. With that being said the wound seems to be mostly arrested at its current size but really is not making any progress except for when we prescribed the prednisone. He did show some signs of dropping as far as the overall size of the wound during that interval week. Nonetheless this is something he is not on long-term at this point and unfortunately I think he is getting need either this or else the Humira which his dermatologist has discussed try to get approval for. With that being said he will be seeing his dermatologist on the 11th of this month that is November. 04/19/2019 on evaluation today patient appears to be doing really about the same the wound is measuring slightly larger compared to last time I saw him. He has not been into the office since November 2 due to the fact that he unfortunately had Covid as that his entire family. He tells me that it was rough but they did pull-through and he seems to be doing much better. Fortunately  there is no signs of active infection at this time. No fevers, chills, nausea, vomiting, or diarrhea. 05/10/2019 on evaluation today patient unfortunately appears to be doing significantly worse as compared to last time I saw him. He does tell me that he has had his first dose of Humira and actually is scheduled to get the next one in the upcoming week. With that being said he tells me also that in the past several days he has been having a lot of issues with green drainage she showed me a picture this is more blue-green in color. He is also been having  issues with increased sloughy buildup and the wound does appear to be larger today. Obviously this is not the direction that we want everything to take based on the starting of his Humira. Nonetheless I think this is definitely a result of likely infection and to be honest I think this is probably Pseudomonas causing the infection based on what I am seeing. 05/24/2019 on evaluation today patient unfortunately appears to be doing significantly worse compared to his prior evaluation with me 2 weeks ago. I did review his culture results which showed that he does have Staph aureus as well as Pseudomonas noted on the culture. Nonetheless the Levaquin that I prescribed for him does not appear to have been appropriate and in fact he tells me he is no longer experiencing the green drainage and discharge that he had at the last visit. Fortunately there is no signs of active infection at this time which is good news although the wound has significantly worsened it in fact is much deeper than it was previous. We have been utilizing up to this point triamcinolone ointment as the prescription topical of choice but at this time I really feel like that the wound is getting need to be packed in order to appropriately manage this due to the deeper nature of the wound. Therefore something along the lines of an alginate dressing may be more appropriate. 05/31/2019 upon inspection today patient's wound actually showed signs of doing poorly at this point. Unfortunately he just does not seem to be making any good progress despite what we have tried. He actually did go ahead and pick up the Cipro and start taking that as he was noticing more green drainage he had previously completed the Levaquin that I prescribed for him as well. Nonetheless he missed his appointment for the seventh last week on Wednesday with the wound care center and [facility] where his dermatologist referred him. Obviously I do think a second opinion  would be helpful at this point especially in light of the fact that the patient seems to be doing so poorly despite the fact that we have tried everything that I really know how at this point. The only thing that ever seems to have helped him in the past is when he was on high doses of continual steroids that did seem to make a difference for him. 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 06/07/2019 upon evaluation today patient unfortunately appears to be doing worse yet again with regard to his wound. In fact I am starting to question whether or not he may have a fluid pocket in the muscle at this point based on the bulging and the soft appearance to the  central portion of the muscle area. There is not anything draining from the muscle itself at this time which is good news but nonetheless the wound is expanding. I am not really seeing any results of the Humira as far as overall wound progression based on what I am seeing at this point. The patient has been referred for second opinion with regard to his wound to the [facility] wound care center by his dermatologist which I definitely am not in opposition to. Unfortunately we tried multiple dressings in the past including collagen, alginate, and at one point even Hydrofera Blue. With that being said he is never really used it for any significant amount of time due to the fact that he often complains of pain associated with these dressings and then will go back to either using the Santyl which she has done intermittently or more frequently the triamcinolone. He is also using his own compression stockings. We have wrapped him in the past but again that was something else that he really was not a big fan of. 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 He seemed to do much better when he was on the steroids although honestly I understand that the steroids are not necessarily the best medication to be on long-term obviously 06/14/2019 on evaluation today patient appears to be doing actually a little bit better with regard to the overall appearance with his leg. Unfortunately he does continue to have issues with what appears to be some fluid underneath the muscle although he did see the wound specialty center at [facility] last week their main goals were to see about infusion therapy in place of the Humira as they feel like that is not quite strong enough. They also recommended that we continue with the treatment otherwise as we are they felt like that was appropriate and they are okay with him continuing to follow-up here with [place] in that regard. With that being said they are also sending him to the vein specialist there to see about vein stripping and if that would be of benefit for him. Subsequently they also did not really address whether or not an ultrasound of the muscle area to see if there is anything that needs to be addressed here would be appropriate or not. For that reason I discussed this with him last week I think we may proceed down that road at this point. 06/21/2019 upon evaluation today patient's wound actually appears to be doing slightly better compared to previous evaluations. I do believe that he has made a difference with regard to the progression here with the use of oral steroids. Again in the past has been the only thing that is really calm things down. He does tell me that from [facility] is gotten a good news from there that there are no further vein stripping that is  necessary at this point. I do not have that available for review today although the patient did relay this to me. He also did obtain and have the ultrasound of the wound completed which I did sign off on today. It does appear that there is no fluid collection under the  muscle this is likely then just edematous tissue in general. That is also good news. Overall I still believe the inflammation is the main issue here. He did inquire about the possibility of a wound VAC again with the muscle protruding like it is I am not really sure whether the wound VAC is necessarily ideal or not. That is something we will have to consider although I do believe he may need compression wrapping to try to help with edema control which could potentially be of benefit. 06/28/2019 on evaluation today patient appears to be doing slightly better measurement wise although this is not terribly smaller he least seems to be trending towards that direction. With that being said he still seems to have purulent drainage noted in the wound bed at this time. He has been on Levaquin followed by Cipro over the past month. Unfortunately he still seems to have some issues with active infection at this time. I did perform a culture last week in order to evaluate and see if indeed there was still anything going on. Subsequently the culture did come back Bernard, Kamuela ([id number]) showing Pseudomonas which is consistent with the drainage has been having which is blue-green in color. He also has had an odor that again was somewhat consistent with Pseudomonas as well. Long story short it appears that the culture showed an intermediate finding with regard to how well the Cipro will work for the Pseudomonas infection. Subsequently being that he does not seem to be clearing up and at best what we are doing is just keeping this at [place] I think he may need to see infectious disease to discuss IV antibiotic options. 07/05/2019 upon evaluation today  patient appears to be doing okay in regard to his leg ulcer. He has been tolerating the dressing changes at this point without complication. Fortunately there is no signs of active infection at this time which is good news. No fevers, chills, nausea, vomiting, or diarrhea. With that being said he does have an appointment with infectious disease tomorrow and his primary care on Wednesday. Again the reason for the infectious disease referral was due to the fact that he did not seem to be fully resolving with the use of oral antibiotics and therefore we were thinking that IV antibiotic therapy may be necessary secondary to the fact that there was an intermediate finding for how effective the Cipro may be. Nonetheless again he has been having a lot of purulent and even green drainage. Fortunately right now that seems to have calmed down over the past week with the reinitiation of the oral antibiotic. Nonetheless we will see what Dr. Megan Salon has to say. 07/12/2019 upon evaluation today patient appears to be doing about the same at this point in regard to his left lower extremity ulcer. Fortunately there is no signs of active infection at this time which is good news I do believe the Levaquin has been beneficial I did review Dr. Hale Bogus note and to be honest I agree that the patient's leg does appear to be doing better currently. What we found in the past as he does not seem to really completely resolve he will stop the antibiotic and then subsequently things will revert back to having issues with blue-green drainage, increased pain, and overall worsening in general. Obviously that is the reason I sent him back to infectious disease. 07/19/2019 upon evaluation today patient appears to be doing roughly the same in size there is really no dramatic improvement. He has started  back on the Levaquin at this point and though he seems to be doing okay he did still have a lot of blue/green drainage noted on  evaluation today unfortunately. I think that this is still indicative more likely of a Pseudomonas infection as previously noted and again he does see Dr. Megan Salon in just a couple of days. I do not know that were really able to effectively clear this with just oral antibiotics alone based on what I am seeing currently. Nonetheless we are still continue to try to manage as best we can with regard to the patient and his wound. I do think the wrap was helpful in decreasing the edema which is excellent news. No fevers, chills, nausea, vomiting, or diarrhea. 07/26/2019 upon evaluation today patient appears to be doing slightly better with regard to the overall appearance of the muscle there is no dark discoloration centrally. Fortunately there is no signs of active infection at this time. No fevers, chills, nausea, vomiting, or diarrhea. Patient's wound bed currently the patient did have an appointment with Dr. Megan Salon at infectious disease last week. With that being said Dr. Megan Salon the patient states was still somewhat hesitant about put him on any IV antibiotics he wanted [place] to repeat cultures today and then see where things go going forward. He does look like Dr. Megan Salon because of some improvement the patient did have with the Levaquin wanted [place] to see about repeating cultures. If it indeed grows the Pseudomonas again then he recommended a possibility of considering a PICC line placement and IV antibiotic therapy. He plans to see the patient back in 1 to 2 weeks. 08/02/2019 upon evaluation today patient appears to be doing poorly with regard to his left lower extremity. We did get the results of his culture back it shows that he is still showing evidence of Pseudomonas which is consistent with the purulent/blue-green drainage that he has currently. Subsequently the culture also shows that he now is showing resistance to the oral fluoroquinolones which is unfortunate as that was really the only thing  to treat the infection prior. I do believe that he is looking like this is going require IV antibiotic therapy to get this under control. Fortunately there is no signs of systemic infection at this time which is good news. The patient does see Dr. Megan Salon tomorrow. 08/09/2019 upon evaluation today patient appears to be doing better with regard to his left lower extremity ulcer in regard to the overall appearance. He is currently on IV antibiotic therapy. As ordered by Dr. Megan Salon. Currently the patient is on ceftazidime which she is going to take for the next 2 weeks and then follow-up for 4 to 5-week appointment with Dr. Megan Salon. The patient started this this past Friday symptoms have not for a total of 3 days currently in full. 08/16/2019 upon evaluation today patient's wound actually does show muscle in the base of the wound but in general does appear to be much better as far as the overall evidence of infection is concerned. In fact I feel like this is for the most part cleared up he still on the IV antibiotics he has not completed the full course yet but I think he is doing much better which is excellent news. 08/23/2019 upon evaluation today patient appears to be doing about the same with regard to his wound at this point. He tells me that he still has pain unfortunately. Fortunately there is no evidence of systemic infection at this time which is  great news. There is significant muscle protrusion. 09/13/19 upon evaluation today patient appears to be doing about the same in regard to his leg unfortunately. He still has a lot of drainage coming from the ulceration there is still muscle exposed. With that being said the patient's last wound culture still showed an intermediate finding with regard to the Pseudomonas he still having the bluish/green drainage as well. Overall I do not know that the wound has completely cleared of infection at this point. Fortunately there is no signs of active infection  systemically at this point which is good news. 09/20/2019 upon evaluation today patient's wound actually appears to be doing about the same based on what I am seeing currently. I do not see any signs of systemic infection he still does have evidence of some local infection and drainage. He did see Dr. Megan Salon last week and Dr. Megan Salon states that he probably does need a different IV antibiotic although he does not want to put him on this until the patient begins the Remicade infusion which is actually scheduled for about 10 days out from today on 13 May. Following that time Dr. Megan Salon is good to see him back and then will evaluate the feasibility of starting him on the IV antibiotic therapy once again at that point. I do not disagree with this plan I do believe as Dr. Megan Salon stated in his note that I reviewed today that the patient's issue is multifactorial with the pyoderma being 1 aspect of this that were hoping the Remicade will be helpful for her. In the meantime I think the gentamicin is, helping to keep things under decent okay control in regard to the ulcer. 09/27/2019 upon evaluation today patient appears to be doing about the same with regard to his wound still there is a lot of muscle exposure though he does have some hyper granulation tissue noted around the edge and actually some granulation tissue starting to form over the muscle which is actually good news. Fortunately there is no evidence of active infection which is also good news. His pain is less at this point. 5/21; this is a patient I have not seen in a long time. 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 He has a large wound on the left lateral leg with protruding muscle. He comes in the clinic today showing the same area on his left medial ankle. He says there is been a spot there for some time although we have not previously defined this. Today he has a clearly defined area with slight  amount of skin breakdown surrounded by raised areas with a purplish hue in color. This is not painful he says it is irritated. 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 He still has muscle protruding from the lateral portion of his left leg but fortunately the new area that they were concerned about at his last visit does not appear to have opened at this point. He is currently on Remicade infusions and seems to be doing better in my opinion in fact the wound itself seems to be overall much better. The purplish discoloration that he did have seems to have resolved and I think that is a good sign that hopefully the Remicade is doing its job. He does [name], Rabon ([id number]) have some biofilm noted over the surface of the wound. 11/01/2019 on evaluation today patient's wound actually appears to  be doing excellent at this time. Fortunately there is no evidence of active infection and overall I feel like he is making great progress. The Remicade seems to be due excellent job in my opinion. 11/08/19 evaluation today vision actually appears to be doing quite well with regard to his weight ulcer. He's been tolerating dressing changes without complication. Fortunately there is no evidence of infection. No fevers, chills, nausea, or vomiting noted at this time. Overall states that is having more itching than pain which is actually a good sign in my opinion. 12/13/2019 upon evaluation today patient appears to be doing well today with regard to his wound. He has been tolerating the dressing changes without complication. Fortunately there is no sign of active infection at this time. No fevers, chills, nausea, vomiting, or diarrhea. Overall I feel like the infusion therapy has been very beneficial for him. 01/06/2020 on evaluation today patient appears to be doing well with regard to his wound. This is  measuring smaller and actually looks to be doing better. Fortunately there is no signs of active infection at this point. No fevers, chills, nausea, vomiting, or diarrhea. With that being said he does still have the blue-green drainage but this does not seem to be causing any significant issues currently. He has been using the gentamicin that does seem to be keeping things under decent control at this point. He goes later this morning for his next infusion therapy for the pyoderma which seems to also be very beneficial. 02/07/2020 on evaluation today patient appears to be doing about the same in regard to his wounds currently. Fortunately there is no signs of active infection systemically he does still have evidence of local infection still using gentamicin. He also is showing some signs of improvement albeit slowly I do feel like we are making some progress here. 02/21/2020 upon evaluation today patient appears to be making some signs of improvement the wound is measuring a little bit smaller which is great news and overall I am very pleased with where he stands currently. He is going to be having infusion therapy treatment on the 15th of this month. Fortunately there is no signs of active infection at this time. 03/13/2020 I do believe patient's wound is actually showing some signs of improvement here which is great news. He has continue with the infusion therapy through rheumatology/dermatology at [facility]. That does seem to be beneficial. I still think he gets as much benefit from this as he did from the prednisone initially but nonetheless obviously this is less harsh on his body that the prednisone as far as they are concerned. 03/31/2020 on evaluation today patient's wound actually showing signs of some pretty good improvement in regard to the overall appearance of the wound bed. There is still muscle exposed though he does have some epithelial growth around the edges of the wound. Fortunately there  is no signs of active infection at this time. No fevers, chills, nausea, vomiting, or diarrhea. 04/24/2020 upon evaluation today patient appears to be doing about the same in regard to his leg ulcer. He has been tolerating the dressing changes without complication. Fortunately there is no signs of active infection at this time. No fevers, chills, nausea, vomiting, or diarrhea. With that being said he still has a lot of irritation from the bandaging around the edges of the wound. We did discuss today the possibility of a referral to plastic surgery. 05/22/2020 on evaluation today patient appears to be doing well with regard to his  wounds all things considered. He has not been able to get the Chantix apparently there is a recall nurse that I was unaware of put out by [employer] involuntarily. Nonetheless for now I am and I have to do some research into what may be the best option for him to help with quitting in regard to smoking and we discussed that today. 06/26/2020 upon evaluation today patient appears to be doing well with regard to his wound from the standpoint of infection I do not see any signs of infection at this point. With that being said unfortunately he is still continuing to have issues with muscle exposure and again he is not having a whole lot of new skin growth unfortunately. There does not appear to be any signs of active infection at this time. No fevers, chills, nausea, vomiting, or diarrhea. 07/10/2020 upon evaluation today patient appears to be doing a little bit more poorly currently compared to where he was previous. I am concerned currently about an active infection that may be getting worse especially in light of the increased size and tenderness of the wound bed. No fevers, chills, nausea, vomiting, or diarrhea. 07/24/2020 upon evaluation today patient appears to be doing poorly in regard to his leg ulcer. He has been tolerating the dressing changes without complication but  unfortunately is having a lot of discomfort. 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 Subsequently I think he is going require possibly IV antibiotics to get this under control. I am very concerned about the severity of his infection and the amount of discomfort he is having. 07/31/2020 upon evaluation today patient appears to be doing about the same in regard to his leg wound. He did see Dr. Megan Salon and Dr. Megan Salon is actually going to start him on IV antibiotics. He goes for the PICC line tomorrow. With that being said there do not have that run for 2 weeks and then see how things are doing and depending on how he is progressing they may extend that a little longer. Nonetheless I am glad this is getting ready to be in place and definitely feel it may help the patient. In the meantime is been using mainly triamcinolone to the wound bed has an anti-inflammatory. 08/07/2020 on evaluation today patient appears to be doing well with regard to his wound compared even last week. In the interim he has gotten the PICC line placed and overall this seems to be doing excellent. There does not appear to be any evidence of infection which is great news systemically although locally of course has had the infection this appears to be improving with the use of the antibiotics. 08/14/2020 upon evaluation today patient's wound actually showing signs of excellent improvement. Overall the irritation has significantly improved the drainage is back down to more of a normal level and his pain is really pretty much nonexistent compared to what it was. Obviously I think that this is significantly improved secondary to the IV antibiotic therapy which has made all the difference in the world. Again he had a resistant form of Pseudomonas for which oral antibiotics just was not cutting it. Nonetheless I do think that still we need to consider the  possibility of a surgical closure for this wound is been open so long and to be honest with muscle exposed I think this can be very hard to get this to close outside of this although definitely were still working to try to do  what we can in that regard. 08/21/2020 upon evaluation today patient appears to be doing very well with regard to his wounds on the left lateral lower extremity/calf area. Fortunately there does not appear to be signs of active infection which is great news and overall very pleased with where things stand today. He is actually wrapping up his treatment with IV antibiotics tomorrow. After that we will see where things go from there. 08/28/2020 upon evaluation today patient appears to be doing decently well with regard to his leg ulcer. There does not appear to be any signs of Ohanian, Maxfield ([id number]) active infection which is great news and overall very pleased with where things stand today. No fevers, chills, nausea, vomiting, or diarrhea. 09/18/2020 upon evaluation today patient appears to be doing well with regard to his infection which I feel like is better. Unfortunately he is not doing as well with regard to the overall size of the wound which is not nearly as good at this point. I feel like that he may be having an issue here with the pyoderma being somewhat out of control. I think that he may benefit from potentially going back and talking to the dermatologist about what to do from the pyoderma standpoint. I am not certain if the infusions are helping nearly as much is what the prednisone did in the past. 10/02/2020 upon evaluation today patient appears to be doing well with regard to his leg ulcer. He did go to the [facility] nurse. Unfortunately they feel like there is a 10% chance that most that he would be able to heal and that the skin graft would take. Obviously this has led him to not be able to go down that path as far as treatment is concerned. Nonetheless he does seem  to be doing a little bit better with the prednisone that I gave him last time. I think that he may need to discuss with dermatology the possibility of long-term prednisone as that seems to be what is most helpful for him to be perfectly honest. I am not sure the Remicade is really doing the job. 10/17/2020 upon evaluation today patient appears to be doing a little better in regard to his wound. In fact the case has been since we did the prednisone on May 2 for him that we have noticed a little bit of improvement each time we have seen a size wise as well as appearance wise as well as pain wise. I think the prednisone has had a greater effect then the infusion therapy has to be perfectly honest. With that being said the patient also feels significantly better compared to what he was previous. All of this is good news but nonetheless I am still concerned about the fact that again we are really not set up to long-term manage him as far as prednisone is concerned. Obviously there are things that you need to be watched I completely understand the risk of prednisone usage as well. 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 With all that being said I do believe that we can give him another round of the prednisone which she is requesting today because of the improvement that he seen since we did that first round. 10/30/2020 upon evaluation today patient's wound actually is showing signs of doing quite well. There does not appear to be any evidence of infection which is great news and overall very pleased with where things stand today.  No fevers, chills, nausea, vomiting, or diarrhea. He tells me that the prednisone still has seem to have helped he wonders if we can extend that for just a little bit longer. He did not have the appointment with a dermatologist although he did have an infusion appointment last Friday. That was at [facility]. With that being said he tells me  he could not do both that as well as the appointment with the physician on the same day therefore that is can have to be rescheduled. I really want to see if there is anything they feel like that could be done differently to try to help this out as I am not really certain that the infusions are helping significantly here. 11/13/2020 upon evaluation today patient unfortunately appears to be doing somewhat poorly in regard to his wound I feel like this is actually worsening from the standpoint of the pyoderma spreading. I still feel like that he may need something different as far as trying to manage this going forward. Again we did the prednisone unfortunately his blood sugars are not doing so well because of this. Nonetheless I believe that the patient likely needs to try topical steroid. We have done triamcinolone for a while I think going with something stronger such as clobetasol could be beneficial again this is not something I do lightly I discussed this with the patient that again this does not normally put underneath an occlusive dressing. Nonetheless I think a thin film as such could help with some of the stronger anti-inflammatory effects. We discussed this today. He would like to try to give this a trial for the next couple weeks. I definitely think that is something that we can do. Evaluate7/03/2021 and today patient's wound bed actually showed signs of doing really about the same. There was a little expansion of the size of the wound and that leading edge that we done looking out although the clobetasol does seem to have slowed this down a bit in my opinion. There is just 1 small area that still seems to be progressing based on what I see. Nonetheless I am concerned about the fact this does not seem to be improving if anything seems to be doing a little bit worse. I do not know that the infusions are really helping him much as next infusion is August 5 his appointment with dermatology is  July 25. Either way I really think that we need to have a conversation potentially about this and I am actually going to see if I can talk with Dr. Lillia Carmel in order to see where things stand as well. 12/11/2020 upon evaluation today patient appears to be doing worse in regard to his leg ulcer. Unfortunately I just do not think this is making the progress that I would like to see at this point. Honestly he does have an appointment with dermatology and this is in 2 days. I am wondering what they may have to offer to help with this. Right now what I am seeing is that he is continuing to show signs of worsening little by little. Obviously that is not great at all. Is the exact opposite of what we are looking for. 12/18/2020 upon evaluation today patient appears to be doing a little better in regard to his wound. The dermatologist actually did do some steroid injections into the wound which does seem to have been beneficial in my opinion. That was on the 25th already this looks a little better to me than last  time I saw him. With that being said we did do a culture and this did show that he has Staph aureus noted in abundance in the wound. With that being said I do think that getting him on an oral antibiotic would be appropriate as well. Also think we can compression wrap and this will make a difference as well. 12/28/2020 upon evaluation today patient's wound is actually showing signs of doing much better. I do believe the compression wrap is helping he has a lot of drainage but to be honest I think that the compression is helping to some degree in this regard as well as not draining through which is also good news. No fevers, chills, nausea, vomiting, or diarrhea. 01/04/2021 upon evaluation today patient appears to be doing well with regard to his wound. Overall things seem to be doing quite well. He did have a little bit of reaction to the CarboFlex Sorbact he will be using that any longer. With that  being said he is controlled as far as the drainage is concerned overall and seems to be doing quite well. I do not see any signs of active infection at this time which is great news. No fevers, chills, nausea, vomiting, or diarrhea. 01/11/2021 upon evaluation today patient appears to be doing well with regard to his wounds. He has been tolerating the dressing changes without complication. Fortunately there does not appear to be any signs of active infection at this time which is great news. Overall I am extremely pleased with where we stand currently. No fevers, chills, nausea, vomiting, or diarrhea. Where using clobetasol in the wound bed he has a lot of new skin growth which is awesome as well. 01/18/2021 upon evaluation today patient appears to be doing very well in regard to his leg ulcer. He has been tolerating the dressing changes without complication. Fortunately there does not appear to be any signs of active infection which is great news. In general I think that he is making excellent progress 01/25/2021 upon evaluation today patient appears to be doing well with regard to his wound on the leg. I am actually extremely pleased with where things stand today. There does not appear to be any signs of active infection which is great news and overall I think that we are definitely headed in the appropriate direction based on what I am seeing currently. There does not appear to be any signs of active infection also excellent news. 02/06/2021 upon evaluation today patient appears to be doing well with regard to his wound. Overall visually this is showing signs of significant Berman, Nic ([id number]) improvement which is great news. I do not see any signs of active infection systemically which is great even locally I do not think that we are seeing any major complications here. We did do fluorescence imaging with the [employer] today. The patient does have some odor and drainage noted and again this  is something that I think would benefit him to probably come more frequently for nurse visits. 02/19/2021 upon evaluation today patient actually appears to be doing quite well in regard to his wound. He has been tolerating the dressing changes without complication and overall I think that this is making excellent progress. I do not see any evidence of active infection at this point which is great news as well. No fevers, chills, nausea, vomiting, or diarrhea. 10/10; wound is made nice progress healthy granulation with a nice rim of epithelialization which seems to be expanding even from  last week he has a deeper area in the inferior part of the more distal part of the wound with not quite as healthy as surface. This area will need to be followed. Using clobetasol and Hydrofera Blue 03/05/2021 upon evaluation today patient appears to be doing very well in regard to his leg ulcer. He has been tolerating dressing changes without complication. Fortunately there does not appear to be any signs of active infection which is great news and overall I am extremely pleased with where we stand currently. 03/12/2021 upon evaluation today patient appears to be doing well with regard to his wound in fact this is extremely extremely good based on what we are seeing today there does not appear to be any signs of active infection and overall I think that he is doing awesome from the standpoint of healing in general. I am extremely pleased with how things seem to be progressing with regard to this pyoderma. Clobetasol has done wonders for him. I think the compression wrapping has also been of great benefit. 03/19/2021 upon evaluation today patient appears to be doing well with regard to his wound. He is tolerating the dressing changes without complication. In fact I feel like that he is actually making excellent progress at this point based on what I am seeing. No fevers, chills, nausea, vomiting, or  diarrhea. 03/26/2021 upon evaluation today patient appears to be doing well with regard to his wound. This again is measuring smaller and looking better. Again the progress is slow but nonetheless continual with what we have been seeing. I do believe that the current plan is doing awesome for him. 04/09/2021 upon evaluation today patient appears to be doing well with regard to his leg ulcer. This is showing signs of excellent improvement the muscle is completely closed over and there does not appear to be any evidence of inflammation at this point his drainage is significantly improved. Overall I think that he would be a good candidate for looking into a skin substitute at this point as well. We will get a look into some approvals in that regard. Potentially TheraSkin as well as Apligraf could both be considered just depending on insurance coverage. 04/16/2021 upon evaluation today patient appears to be doing well at this point. He has been approved for the Apligraf which we could definitely order although I would like to try to get the TheraSkin approved if at all possible. I did fax notes into them today and I Georgina Peer try to give a call as well. Overall the wound appears to be doing decently well today. 04/20/2021 upon evaluation today patient actually appears to be doing quite well in regard to his wound with that being said we are trying to see what we can do about speeding up the healing process. For that reason we did discuss the possibility of a skin substitute. We got the Apligraf approved. For that reason we will get a go ahead and see what we can do with the Apligraf at this point. I am still trying to get the TheraSkin approved but I have not heard anything from the insurance company yet I have called and talked to them earlier in the week and to be honest this was about half an hour that I spent on the phone they told me I would hear something within 10-15 business days. 04/27/2021 upon  evaluation today patient appears to be doing excellent in regard to his wound. I do believe the Apligraf has been beneficial. With that  being said he has had a little bit of increased pain has been a little bit concerned. I do want to go ahead and apply his steroid cream today the clobetasol and then put the Apligraf over I just do not think I want to risk not putting the clobetasol on based on what is been going on here currently. Patient voiced understanding. 05/04/2021 upon evaluation today patient is making excellent improvement. Overall there is definitely decrease in the size of the wound today and I am very pleased in that regard. I do not see any signs of active infection locally nor systemically at this point. No fevers, chills, nausea, vomiting, or diarrhea. 05/11/2021 upon evaluation today patient appears to be doing well with regard to his wound. He has been tolerating Apligraf's without complication and is making excellent progress this is application #4 today. 12/30; patient here for Apligraf application. Appears to be doing well small wound there has been a lot of healing 05/28/2021 upon evaluation today patient appears to be doing well with regard to his wound. Has been tolerating the dressing changes without complication. Fortunately I do not see any signs of active infection locally nor systemically at this point which is great news. He is done with the Apligraf and the first round and overall this has filled in quite nicely I am not even certain he needs any additional at the moment. I would actually try to go back to clobetasol with [facility] which previously was doing well for him. 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 He is using clobetasol and Hydrofera Blue. Surface area of the wound is smaller and the surface looks very healthy. 06/11/2021 upon evaluation today patient actually appears to be showing signs of good  improvement which is great news. Fortunately I do not see any signs of active infection at this time which is also excellent news. I do believe that the patient is doing well with the clobetasol and Hydrofera Blue. He does have some irritation and itching around the rest of the leg will use some triamcinolone over this area. 06/18/2021 upon evaluation today patient appears to be doing well with regard to his wound. He is showing signs of excellent improvement and overall I am extremely pleased with where we stand today. We are moving in the right direction. 06/25/2021 upon evaluation today patient appears to be doing well with regard to his wound. In fact this is doing also not showing signs of excellent improvement overall very pleased with where we stand today. I do not see any evidence of active infection locally or systemically at this time which is also great news. 07/02/2021 upon evaluation patient's wound bed actually showed signs of good granulation and epithelization at this point. And this is measuring significantly smaller and overall I am extremely pleased with where we stand today. No fevers, chills, nausea, vomiting, or diarrhea. 07/09/2021 upon evaluation patient's wound bed actually showed signs of good granulation and epithelization at this point. Fortunately there does not appear to be any evidence of active infection locally or systemically which is great news and overall I am extremely pleased with where we [name], [name] ([id number]) stand at this point. 07/16/2021 upon evaluation today patient appears to be doing well with regard to his wound all things considered although it is maybe a little bit larger than what its been. This does have me a little concerned. Actually question about whether anything is changed and initially he told  me know. In fact it was not until the end of the visit that he actually did mention that he had missed his infusion in January. This very well may be what  is because the difference is also seeing currently. That definitely has seem to been helping more recently. 07/23/2021 upon evaluation today patient's wound is yet again larger despite the switch to a collagen which was not beneficial. Subsequently I am going to at this point check on a couple things. First and foremost I do want to go ahead and do a culture. I think that there is a chance he could have a low-lying infection that may be part of the issue here. Subsequently I am also probably can go ahead and place him on Bactrim DS which I think is a good option and has done well with them in the past. Again if this is an infection that should hopefully help to turn things around. With all that being said I do believe that the patient should hopefully be able to turn this around with reinitiation of the infusion therapy which will be going on this week and subsequently getting on the antibiotic. 3/13; patient presents for follow-up. He has no issues or complaints today. He states he is currently on oral antibiotics for previous culture done in the office. 08/06/2021 upon evaluation today patient appears to be doing well with regard to his wound. I am actually seeing signs of improvement which is great news. I think that he is on a better track he did receive his infusion as well which is great news and overall I think that this should hopefully get him back on track as far as healing is concerned. He is not having any pain and I do not see any signs of inflammation which is great news. 08/13/2021 upon evaluation today patient appears to be doing excellent in regard to his wound on the leg. He has been tolerating the dressing changes without complication. Fortunately I do not see any signs of infection I do believe he is making good progress here and I think her back on track overall the tissue is greatly improved compared to what we have been. 08/20/2021 upon evaluation today patient appears to be doing  excellent in regard to his wound. He has been tolerating the dressing changes without complication. Fortunately I do not see any evidence of infection at this time locally or systemically which is great news and overall very pleased with where we stand. 08-31-2021 upon evaluation today patient appears to be doing excellent in regard to his wound. Overall even with the vacation and extra walking he seems to have really done well. Fortunately I do not see any evidence of active infection locally or systemically which is great news and overall I think we are headed in the right direction. 09-10-2021 upon evaluation today patient's wound is showing signs of improvement. This is a small improvement but nonetheless we seem to be making progress here. I am very pleased in that regard. There does not appear to be any signs of infection and overall things are doing quite nicely. Upon inspection patient's wound bed actually showed signs of good granulation and epithelization at this point. There was some irritation around the edges of the wound where the good skin was it almost appears that something was rubbing or else is was due from drainage and irritation. Nonetheless I am going to see about putting a little bit of zinc just around the wound opening in order  to protect the skin from being damaged. The patient is in agreement with that plan. 5/8; its been a while since I have seen this wound and it looks considerably better although still with some depth. Per our intake nurse the dimensions have improved patient has been using a thin layer of clobetasol, Hydrofera Blue under 4-layer compression. He appears to be making progress 10-01-2021 upon evaluation today patient appears to be doing well with regard to his wound although it is somewhat stalled I feel like were basically at a standstill here. He has previously had Apligraf which did very well for him to be honest. I do believe that he may benefit from  a repeat of the Apligraf. In fact it got so small at that point but I do not think we even used all of the applications that were recommended as this had improved to such a degree. Nonetheless I do believe that the patient currently could benefit from repeat treatment with the Apligraf due to the improvement this that we did see. He is not opposed to this and subsequently working to look into getting approval to reinstitute that treatment. 10-08-2021 upon evaluation today patient appears to be doing well with regard to his wound. Overall I feel like he is doing a little bit better from the standpoint of irritation at this time which is great news. I do not see any signs of active infection locally or systemically which is great news as well. No fevers, chills, nausea, vomiting, or diarrhea. 10-12-2021 upon evaluation today patient appears to be doing well with regard to his wound stable although we are can initiate treatment with Apligraf today. I am hoping this will stimulate things to improve. Patient is in agreement with that plan. 10-19-2028 upon evaluation today patient appears to be doing well with regard to his wound. He has been tolerating the dressing changes without complication. Fortunately there does not appear to be any evidence of active infection locally or systemically at this time which is great news. No fevers, chills, nausea, vomiting, or diarrhea. I do believe the Apligraf has improved the overall appearance of the wound bed receiving some definite improvements here. 10-26-2021 upon evaluation today patient's wound is actually shown some signs of really good improvement I am very pleased in this regard. There is a little bit of slough and biofilm buildup that is stuck to the surface of the wound I am getting very lightly debrided in order to clear this away and then subsequently will get a continue with the Apligraf application today. This is #3. 11-09-2021 upon evaluation today patient  appears to be doing well currently in regard to his wound. This is actually showing signs of significant improvement which is great news and overall I think the Apligraf is doing an amazing job. This wound is significantly smaller compared to the last time I saw him. This will be Apligraf application #4. 01-20-8181 upon evaluation today patient appears to be doing well currently in regard to his wound he is actually showing signs of excellent improvement which is great news and overall I am extremely pleased with where we stand today. I do not see any evidence of active infection locally or systemically at this time which is great news. No fevers, chills, nausea, vomiting, or diarrhea. I believe the Apligraf is doing an awesome job and in fact I [place] see about getting an extension of application which hopefully should not be a problem. Especially in light of how much improvement he  has had with regards to the size of the wound with treatment. This is application #5 today. STEED, KANAAN ([id number]) 12-07-2021 upon evaluation today patient appears to be doing excellent in regard to his wound has been tolerating the dressing changes without complication. Fortunately there does not appear to be any signs of active infection locally or systemically at this time. 12-21-2021 upon evaluation today patient appears to be doing well currently in regard to his wound. We have gotten approval for a repeat Apligraf treatment. I think that is going to be a good way to wrap this up I really feel like we will likely be able to get the wound closed with the additional treatments that have been improved. 01-04-2022 upon evaluation patient appears to be doing excellent in regard to his wound. He is tolerating the dressing changes without complication and is measuring much better today as well. I am very pleased with where we stand. I am not even certain we will get have to use all 5 of the reapproved Apligraf but we will see  how things go right now I am extremely pleased with the progress that is been made. 01-18-2022 upon evaluation today patient appears to be doing well with regard to his wound is actually measuring significantly better which is great news. This is still open but very minimally. I do think that it is appropriate to continue with the Apligraf he has done so well with this. 02-01-2022 upon evaluation today patient actually appears to be doing well currently with regard to his wound in fact this appears to be pretty much completely healed. There is just a very pinpoint area that is leaking a little clear fluid otherwise this is doing quite well. I do not see any evidence of active infection at this time. 02-08-2022 upon evaluation today patient appears to be doing well currently in regard to his wound in fact this is showing signs of complete resolution as of today. 02-15-2022 upon evaluation today patient continues to do well there is really no signs of active infection at this time. He is tolerating the dressing changes without complication and to be honest still appears to be closed which is absolutely awesome. I am hopeful we will be able to discharge him next week without any complication. 02-22-2022 patient's wound is still completely healed as of today. He seems to be doing awesome. I do not see any signs of infection right now. Objective Constitutional Well-nourished and well-hydrated in no acute distress. Vitals Time Taken: 8:10 AM, Height: 71 in, Weight: 338 lbs, BMI: 47.1, Temperature: 98.3 F, Pulse: 90 bpm, Respiratory Rate: 16 breaths/min, Blood Pressure: 131/84 mmHg. Respiratory normal breathing without difficulty. Psychiatric this patient is able to make decisions and demonstrates good insight into disease process. Alert and Oriented x 3. pleasant and cooperative. General Notes: Again patient has complete epithelization this has remained closed over the past 2 weeks and as of today he is  ready for discharge. 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 Please call the office if any further issues arise Wear compression garments daily. Put garments on first thing when you wake up and remove them before bed. Bathing/ Shower/ Hygiene: Wash wounds with antibacterial soap and water. May  shower; gently cleanse wound with antibacterial soap, rinse and pat dry prior to dressing wounds - don't scrub area, use hand to wash gently Additional Orders / Instructions: Other: - use Triamcinolone cream to area x 2 weeks, then use AandD ointment to area apply at nighttime 1. I am going to recommend currently that we have the patient go ahead and continue with the recommendation for using the triamcinolone to the wound once a day at bedtime for 2 weeks. Following that he should issues AandD ointment at bedtime. 2. I am good recommend compression therapy he really needs a 20 to 30 mmHg compression over both legs he is going to go see about getting some compression socks from [employer] socks this afternoon. We will see the patient back for follow-up visit as needed. Electronic Signature(s) Signed: 02/22/2022 1:50:31 PM By: Worthy Keeler PA-C Entered By: Worthy Keeler on 02/22/2022 13:50:31 Elbaum, [name] ([id number]) -------------------------------------------------------------------------------- SuperBill Details Patient Name: [name] Date of Service: 02/22/2022 Medical Record Number: [id number] Patient Account Number: [id number] Date of Birth/Sex: 02-14-1981 (41 y.o. M) Treating RN: [name] Primary Care Provider:  [name] Other Clinician: Massie Kluver Referring Provider: [name] Treating Provider/Extender: [name] Cliche in Treatment: 272 Diagnosis Coding ICD-10 Codes Code Description I87.2 Venous insufficiency (chronic) (peripheral) L97.222 Non-pressure chronic ulcer of left calf with fat layer exposed E11.622 Type 2 diabetes mellitus with other skin ulcer L88 Pyoderma gangrenosum F17.208 Nicotine dependence, unspecified, with other nicotine-induced disorders Facility Procedures CPT4 Code: 28768115 Description: [id number] - WOUND CARE VISIT-LEV 2 EST PT Modifier: Quantity: 1 Physician Procedures CPT4 Code: 3559741 Description: 63845 - WC PHYS LEVEL 3 - EST PT Modifier: Quantity: 1 CPT4 Code: Description: ICD-10 Diagnosis Description I87.2 Venous insufficiency (chronic) (peripheral) L97.222 Non-pressure chronic ulcer of left calf with fat layer exposed E11.622 Type 2 diabetes mellitus with other skin ulcer L88 Pyoderma gangrenosum Modifier: Quantity: Electronic Signature(s) Signed: 02/22/2022 1:50:51 PM By: Worthy Keeler PA-C Entered By: Worthy Keeler on 02/22/2022 13:50:50

## 2022-02-25 ENCOUNTER — Ambulatory Visit: Payer: 59

## 2022-02-25 NOTE — Progress Notes (Signed)
[name], [name] ([id number]) Visit Report for 02/22/2022 Arrival Information Details Patient Name: [name], [name] Date of Service: 02/22/2022 8:15 AM Medical Record Number: [id number] Patient Account Number: [id number] Date of Birth/Sex: [birth year] (41 y.o. M) Treating RN: [name] Primary Care Raenette Sakata: [name] Other Clinician: Massie Kluver Referring Pearlean Sabina: [name] Treating Josearmando Kuhnert/Extender: [name] Cliche in Treatment: 41 Visit Information History Since Last Visit All ordered tests and consults were completed: No Patient Arrived: Ambulatory Added or deleted any medications: No Arrival Time: 08:09 Any new allergies or adverse reactions: No Transfer Assistance: None Had a fall or experienced change in No Patient Requires Transmission-Based No activities of daily living that may affect Precautions: risk of falls: Patient Has Alerts: Yes Hospitalized since last visit: No Patient Alerts: Patient has reaction Pain Present Now: No to silver dressings. Electronic Signature(s) Signed: 02/25/2022 4:50:08 PM By: Massie Kluver Entered By: Massie Kluver on 02/22/2022 08:10:20 [name] ([id number]) -------------------------------------------------------------------------------- Clinic Level of Care Assessment Details Patient Name: [name], [name] Date of Service: 02/22/2022 8:15 AM Medical Record Number: [id number] Patient Account Number: [id number] Date of Birth/Sex: 1980-12-04 (41 y.o. M) Treating RN: [name] Primary Care Devansh Riese: [name] Other Clinician: Massie Kluver Referring Raeden Belzer: [name] Treating Estalene Bergey/Extender: [name] Cliche in Treatment: 272 Clinic Level of Care Assessment Items TOOL 4 Quantity Score []  - Use when only an EandM is performed on FOLLOW-UP visit 0 ASSESSMENTS - Nursing Assessment / Reassessment X - Reassessment of Co-morbidities (includes updates in patient status) 1 10 X- 1 5 Reassessment of  Adherence to Treatment Plan ASSESSMENTS - Wound and Skin Assessment / Reassessment X - Simple Wound Assessment / Reassessment - one wound 1 5 []  - 0 Complex Wound Assessment / Reassessment - multiple wounds []  - 0 Dermatologic / Skin Assessment (not related to wound area) ASSESSMENTS - Focused Assessment X - Circumferential Edema Measurements - multi extremities 2 5 []  - 0 Nutritional Assessment / Counseling / Intervention []  - 0 Lower Extremity Assessment (monofilament, tuning fork, pulses) []  - 0 Peripheral Arterial Disease Assessment (using hand held doppler) ASSESSMENTS - Ostomy and/or Continence Assessment and Care []  - Incontinence Assessment and Management 0 []  - 0 Ostomy Care Assessment and Management (repouching, etc.) PROCESS - Coordination of Care X - Simple Patient / Family Education for ongoing care 1 15 []  - 0 Complex (extensive) Patient / Family Education for ongoing care []  - 0 Staff obtains Programmer, systems, Records, Test Results / Process Orders []  - 0 Staff telephones HHA, Nursing Homes / Clarify orders / etc []  - 0 Routine Transfer to another Facility (non-emergent condition) []  - 0 Routine Hospital Admission (non-emergent condition) []  - 0 New Admissions / Biomedical engineer / Ordering NPWT, Apligraf, etc. []  - 0 Emergency Hospital Admission (emergent condition) X- 1 10 Simple Discharge Coordination []  - 0 Complex (extensive) Discharge Coordination PROCESS - Special Needs []  - Pediatric / Minor Patient Management 0 []  - 0 Isolation Patient Management []  - 0 Hearing / Language / Visual special needs []  - 0 Assessment of Community assistance (transportation, D/C planning, etc.) []  - 0 Additional assistance / Altered mentation []  - 0 Support Surface(s) Assessment (bed, cushion, seat, etc.) INTERVENTIONS - Wound Cleansing / Measurement [name], [name] ([id number]) X- 1 5 Simple Wound Cleansing - one wound []  - 0 Complex Wound Cleansing - multiple  wounds []  - 0 Wound Imaging (photographs - any number of wounds) []  - 0 Wound Tracing (instead of photographs) []  - 0 Simple Wound Measurement - one wound []  -  0 Complex Wound Measurement - multiple wounds INTERVENTIONS - Wound Dressings []  - Small Wound Dressing one or multiple wounds 0 []  - 0 Medium Wound Dressing one or multiple wounds []  - 0 Large Wound Dressing one or multiple wounds []  - 0 Application of Medications - topical []  - 0 Application of Medications - injection INTERVENTIONS - Miscellaneous []  - External ear exam 0 []  - 0 Specimen Collection (cultures, biopsies, blood, body fluids, etc.) []  - 0 Specimen(s) / Culture(s) sent or taken to Lab for analysis []  - 0 Patient Transfer (multiple staff / [name] / Similar devices) []  - 0 Simple Staple / Suture removal (25 or less) []  - 0 Complex Staple / Suture removal (26 or more) []  - 0 Hypo / Hyperglycemic Management (close monitor of Blood Glucose) []  - 0 Ankle / Brachial Index (ABI) - do not check if billed separately X- 1 5 Vital Signs Has the patient been seen at the hospital within the last three years: Yes Total Score: 65 Level Of Care: New/Established - Level 2 Electronic Signature(s) Signed: 02/25/2022 4:50:08 PM By: Massie Kluver Entered By: Massie Kluver on 02/22/2022 08:51:31 [name], [name] ([id number]) -------------------------------------------------------------------------------- Encounter Discharge Information Details Patient Name: [name] Date of Service: 02/22/2022 8:15 AM Medical Record Number: [id number] Patient Account Number: [id number] Date of Birth/Sex: 1981-02-11 (41 y.o. M) Treating RN: [name] Primary Care Abrahm Mancia: [name] Other Clinician: Massie Kluver Referring Azadeh Hyder: [name] Treating Mayank Teuscher/Extender: [name] Cliche in Treatment: 272 Encounter Discharge Information Items Discharge Condition: Stable Ambulatory Status: Ambulatory Discharge  Destination: Home Transportation: Private Auto Accompanied By: self Schedule Follow-up Appointment: Yes Clinical Summary of Care: Electronic Signature(s) Signed: 02/25/2022 4:50:08 PM By: Massie Kluver Entered By: Massie Kluver on 02/22/2022 08:55:13 [name] ([id number]) -------------------------------------------------------------------------------- Lower Extremity Assessment Details Patient Name: [name] Date of Service: 02/22/2022 8:15 AM Medical Record Number: [id number] Patient Account Number: [id number] Date of Birth/Sex: 12-29-1980 (41 y.o. M) Treating RN: [name] Primary Care Jovane Foutz: [name] Other Clinician: Massie Kluver Referring Mose Colaizzi: [name] Treating Kynzlie Hilleary/Extender: [name] Cliche in Treatment: 272 Edema Assessment Assessed: [Left: Yes] [Right: Yes] Edema: [Left: Yes] [Right: Yes] Calf Left: Right: Point of Measurement: 35 cm From Medial Instep 46 cm 50.4 cm Ankle Left: Right: Point of Measurement: 10 cm From Medial Instep 28.4 cm 30.2 cm Vascular Assessment Pulses: Dorsalis Pedis Palpable: [Left:Yes] [Right:Yes] Electronic Signature(s) Signed: 02/22/2022 3:11:48 PM By: [name], [name], RN, [name], [name], [name] Signed: 02/25/2022 4:50:08 PM By: Massie Kluver Entered By: Massie Kluver on 02/22/2022 08:22:28 [name] ([id number]) -------------------------------------------------------------------------------- Multi Wound Chart Details Patient Name: [name] Date of Service: 02/22/2022 8:15 AM Medical Record Number: [id number] Patient Account Number: [id number] Date of Birth/Sex: [birth year] (41 y.o. M) Treating RN: [name] Primary Care Nayleen Janosik: [name] Other Clinician: Massie Kluver Referring Obi Scrima: [name] Treating Manali Mcelmurry/Extender: [name] Cliche in Treatment: 272 Vital Signs Height(in): 71 Pulse(bpm): 90 Weight(lbs): 338 Blood Pressure(mmHg): 131/84 Body Mass Index(BMI):  47.1 Temperature(F): 98.3 Respiratory Rate(breaths/min): 16 Wound Assessments Treatment Notes Electronic Signature(s) Signed: 02/25/2022 4:50:08 PM By: Massie Kluver Entered By: Massie Kluver on 02/22/2022 08:23:05 [name] ([id number]) -------------------------------------------------------------------------------- Multi-Disciplinary Care Plan Details Patient Name: [name] Date of Service: 02/22/2022 8:15 AM Medical Record Number: [id number] Patient Account Number: [id number] Date of Birth/Sex: [birth year] (41 y.o. M) Treating RN: [name] Primary Care Donnavin Vandenbrink: [name] Other Clinician: Massie Kluver Referring Keyosha Tiedt: [name] Treating Merlie Noga/Extender: [name] Cliche in Treatment: [place] reviewed with physician Active  Inactive Electronic Signature(s) Signed: 02/22/2022 3:11:48 PM By: [name], [name], RN, [name], [name], [name] Signed: 02/25/2022 4:50:08 PM By: Massie Kluver Entered By: Massie Kluver on 02/22/2022 08:22:56 [name], [name] ([id number]) -------------------------------------------------------------------------------- Pain Assessment Details Patient Name: [name] Date of Service: 02/22/2022 8:15 AM Medical Record Number: [id number] Patient Account Number: [id number] Date of Birth/Sex: 08-23-1980 (41 y.o. M) Treating RN: [name] Primary Care Journie Howson: [name] Other Clinician: Massie Kluver Referring Shajuana Mclucas: [name] Treating Aleksei Goodlin/Extender: [name] Cliche in Treatment: 272 Active Problems Location of Pain Severity and Description of Pain Patient Has Paino No Site Locations Pain Management and Medication Current Pain Management: Electronic Signature(s) Signed: 02/22/2022 3:11:48 PM By: [name], [name], RN, [name], [name], [name] Signed: 02/25/2022 4:50:08 PM By: Massie Kluver Entered By: Massie Kluver on 02/22/2022 08:19:17 [name]  ([id number]) -------------------------------------------------------------------------------- Patient/Caregiver Education Details Patient Name: [name] Date of Service: 02/22/2022 8:15 AM Medical Record Number: [id number] Patient Account Number: [id number] Date of Birth/Gender: 07/06/1980 (41 y.o. M) Treating RN: [name] Primary Care Physician: [name] Other Clinician: Massie Kluver Referring Physician: [name] Treating Physician/Extender: [name] Cliche in Treatment: 44 Education Assessment Education Provided To: Patient Education Topics Provided Wound/Skin Impairment: Handouts: Other: wound is healed. Please call our office if any further issues arise Methods: Explain/Verbal Responses: State content correctly Electronic Signature(s) Signed: 02/25/2022 4:50:08 PM By: Massie Kluver Entered By: Massie Kluver on 02/22/2022 08:52:38 [name], [name] ([id number]) -------------------------------------------------------------------------------- [place] Details Patient Name: [name] Date of Service: 02/22/2022 8:15 AM Medical Record Number: [id number] Patient Account Number: [id number] Date of Birth/Sex: 1980/12/21 (41 y.o. M) Treating RN: [name] Primary Care Lott Seelbach: [name] Other Clinician: Massie Kluver Referring Oleta Gunnoe: [name] Treating Ranae Casebier/Extender: [name] Cliche in Treatment: 272 Vital Signs Time Taken: 08:10 Temperature (F): 98.3 Height (in): 71 Pulse (bpm): 90 Weight (lbs): 338 Respiratory Rate (breaths/min): 16 Body Mass Index (BMI): 47.1 Blood Pressure (mmHg): 131/84 Reference Range: 80 - 120 mg / dl Electronic Signature(s) Signed: 02/25/2022 4:50:08 PM By: Massie Kluver Entered By: Massie Kluver on 02/22/2022 08:12:45

## 2022-02-27 ENCOUNTER — Ambulatory Visit: Payer: 59

## 2022-02-27 ENCOUNTER — Ambulatory Visit: Payer: 59 | Admitting: Primary Care

## 2022-02-28 ENCOUNTER — Ambulatory Visit (INDEPENDENT_AMBULATORY_CARE_PROVIDER_SITE_OTHER): Payer: 59 | Admitting: Primary Care

## 2022-02-28 ENCOUNTER — Encounter: Payer: Self-pay | Admitting: Primary Care

## 2022-02-28 VITALS — BP 132/62 | HR 75 | Temp 98.2°F | Ht 71.0 in | Wt 349.0 lb

## 2022-02-28 DIAGNOSIS — Z794 Long term (current) use of insulin: Secondary | ICD-10-CM

## 2022-02-28 DIAGNOSIS — E1165 Type 2 diabetes mellitus with hyperglycemia: Secondary | ICD-10-CM

## 2022-02-28 LAB — POCT GLYCOSYLATED HEMOGLOBIN (HGB A1C): Hemoglobin A1C: 8.3 % — AB (ref 4.0–5.6)

## 2022-02-28 MED ORDER — TRULICITY 4.5 MG/0.5ML ~~LOC~~ SOAJ: 4.5000 mg | SUBCUTANEOUS | 0 refills | Status: DC

## 2022-02-28 NOTE — Assessment & Plan Note (Signed)
Uncontrolled with A1C today of 8.3.  He is not taking metformin correctly, discussed that he should be taking 1000 mg BID rather than once daily.  Resume metformin 1000 mg BID. Continue Lantus 30 units nightly. Increase Trulicity to 4.5 mg weekly.  Discussed to notify me if he continue to see glucose readings at or above 150 consistently after a few weeks. Consider addition of glipizide vs increasing and splitting the dose of Lantus.  Follow up in 3 months.

## 2022-02-28 NOTE — Progress Notes (Signed)
Subjective:    Patient ID: [name], male    DOB: 03/28/1981, 41 y.o.   MRN: [id number]  HPI  [name] is a very pleasant 41 y.o. male with a history of type 2 diabetes, hypertension, chronic left lower extremity skin ulcer, hyperlipidemia who presents today for follow-up of diabetes.  Current medications include: Metformin 1000 mg twice daily, Lantus 30 units evening (decreased due to hypoglycemic episdodes), Trulicity 3 mg weekly (increased from last visit). He is only taking metformin 1000 mg in the evening.   He is checking his blood glucose continuously and is getting readings of:  Morning: 140's Afternoon: mid 100's Dinner: mid to high 200's  He denies drops in glucose levels below 67. He did not eat that day. Typically   Last A1C: 7.6 in July 2023, 8.3 today Last Eye Exam: Up-to-date Last Foot Exam: Due Pneumonia Vaccination: Declines Urine Microalbumin: Up-to-date Statin: Atorvastatin  Dietary changes since last visit: None.    Exercise: Active at work  BP Readings from Last 3 Encounters:  02/28/22 132/62  11/22/21 126/82  07/31/21 130/74        Review of Systems  Respiratory:  Negative for shortness of breath.   Cardiovascular:  Negative for chest pain.  Neurological:  Negative for dizziness and numbness.         Past Medical History:  Diagnosis Date   Blood in stool    Depression    Elevated blood pressure    Hyperlipidemia    OSA (obstructive sleep apnea) 2014   Seasonal allergies    Ulcerative colitis ([place]) 03/05/2018    Social History   Socioeconomic History   Marital status: Married    Spouse name: Not on file   Number of children: Not on file   Years of education: Not on file   Highest education level: Not on file  Occupational History   Not on file  Tobacco Use   Smoking status: Every Day    Packs/day: 1.00    Years: 14.00    Total pack years: 14.00    Types: Cigarettes    Last attempt to quit: 11/25/2018    Years  since quitting: 3.2   Smokeless tobacco: Former    Quit date: 03/17/2015  Vaping Use   Vaping Use: Never used  Substance and Sexual Activity   Alcohol use: Not Currently    Alcohol/week: 0.0 standard drinks of alcohol    Comment: soical   Drug use: Not on file   Sexual activity: Not on file  Other Topics Concern   Not on file  Social History Narrative   Married.   7 kids.   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   Enjoys target shooting.    Social Determinants of Health   Financial Resource Strain: Not on file  Food Insecurity: Not on file  Transportation Needs: Not on file  Physical Activity: Not on file  Stress: Not on file  Social Connections: Not on file  Intimate Partner Violence: Not on file    Past Surgical History:  Procedure Laterality Date   SEPTOPLASTY  2007   WRIST SURGERY      Family History  Problem Relation Age of Onset   Multiple sclerosis Mother    Dementia Mother    Alcohol abuse Paternal Aunt    Alcohol abuse Paternal Uncle    Stroke Paternal Uncle    Hyperlipidemia Maternal Grandfather    Hypertension Maternal Grandfather    Diabetes Maternal Grandfather  Alcohol abuse Maternal Grandfather    Breast cancer Neg Hx     Allergies  Allergen Reactions   Biaxin [Clarithromycin] Other (See Comments)    Causes colitis flares   Milk-Related Compounds Hives    Current Outpatient Medications on File Prior to Visit  Medication Sig Dispense Refill   acetaminophen (TYLENOL) 500 MG tablet Take 1,000 mg by mouth every 6 (six) hours as needed for headache (pain).     atorvastatin (LIPITOR) 40 MG tablet TAKE 1 TABLET(40 MG) BY MOUTH DAILY FOR CHOLESTEROL 90 tablet 3   blood glucose meter kit and supplies KIT Dispense based on patient and insurance preference. Use up to four times daily as directed. (FOR ICD-9 250.00, 250.01). 1 each 0   Continuous Blood Gluc Receiver (DEXCOM G6 RECEIVER) DEVI 1 Device by Does not apply route continuous. 1 each 0    Continuous Blood Gluc Sensor (DEXCOM G6 SENSOR) MISC APPLY 1 SENSOR INTO THE SKIN EVERY 10 DAYS 3 each 5   Continuous Blood Gluc Transmit (DEXCOM G6 TRANSMITTER) MISC Use continuously to check blood sugars. 1 each 1   hydrocortisone 2.5 % ointment Apply topically.     ibuprofen (ADVIL,MOTRIN) 200 MG tablet Take 400-600 mg by mouth every 6 (six) hours as needed for headache (pain).     inFLIXimab (REMICADE) 100 MG injection Inject into the muscle once a week.     Insulin Pen Needle (RELION PEN NEEDLES) 31G X 6 MM MISC USE 1  AT NIGHT WITH  INSULIN  AS  DIRECTED. 300 each 0   ketoconazole (NIZORAL) 2 % cream Apply topically 2 (two) times daily.     LANTUS SOLOSTAR 100 UNIT/ML Solostar Pen Inject 30 Units into the skin at bedtime. For diabetes. 30 mL 1   metFORMIN (GLUCOPHAGE) 1000 MG tablet TAKE 1 TABLET BY MOUTH TWICE DAILY WITH MEALS FOR DIABETES. 180 tablet 1   triamcinolone ointment (KENALOG) 0.1 % Apply topically.     venlafaxine XR (EFFEXOR-XR) 150 MG 24 hr capsule Take 1 capsule (150 mg total) by mouth daily with breakfast. For anxiety and depression. 90 capsule 2   clobetasol ointment (TEMOVATE) 7.61 % Apply 1 application topically 2 (two) times daily. (Patient not taking: Reported on 02/28/2022)     SANTYL ointment Apply topically. (Patient not taking: Reported on 02/28/2022)     No current facility-administered medications on file prior to visit.    BP 132/62   Pulse 75   Temp 98.2 F (36.8 C) (Temporal)   Ht _0  (1.803 m)   Wt (!) 349 lb (158.3 kg)   SpO2 99%   BMI 48.68 kg/m  Objective:   Physical Exam Cardiovascular:     Rate and Rhythm: Normal rate and regular rhythm.  Pulmonary:     Effort: Pulmonary effort is normal.     Breath sounds: Normal breath sounds. No wheezing or rales.  Musculoskeletal:     Cervical back: Neck supple.  Skin:    General: Skin is warm and dry.  Neurological:     Mental Status: He is alert and oriented to person, place, and time.            Assessment & Plan:   Problem List Items Addressed This Visit       Endocrine   Type 2 diabetes mellitus with hyperglycemia ([place]) - Primary    Uncontrolled with A1C today of 8.3.  He is not taking metformin correctly, discussed that he should be taking 1000 mg BID rather than  once daily.  Resume metformin 1000 mg BID. Continue Lantus 30 units nightly. Increase Trulicity to 4.5 mg weekly.  Discussed to notify me if he continue to see glucose readings at or above 150 consistently after a few weeks. Consider addition of glipizide vs increasing and splitting the dose of Lantus.  Follow up in 3 months.       Relevant Medications   Dulaglutide (TRULICITY) 4.5 XB/8.4RQ SOPN   Other Relevant Orders   POCT glycosylated hemoglobin (Hb A1C) (Completed)       Pleas Koch, NP

## 2022-02-28 NOTE — Patient Instructions (Signed)
We increased the dose of your Trulicity to 4.5 mg weekly for diabetes.  Continue Lantus 30 units in the evening.  Take metformin 1000 mg twice daily for diabetes.  Please schedule a follow up visit for 3 months.  It was a pleasure to see you today!

## 2022-03-01 ENCOUNTER — Encounter: Payer: 59 | Admitting: Physician Assistant

## 2022-03-04 ENCOUNTER — Ambulatory Visit: Payer: 59

## 2022-03-06 ENCOUNTER — Ambulatory Visit: Payer: 59

## 2022-03-08 ENCOUNTER — Encounter: Payer: 59 | Admitting: Physician Assistant

## 2022-03-11 ENCOUNTER — Ambulatory Visit: Payer: 59

## 2022-03-13 ENCOUNTER — Ambulatory Visit: Payer: 59

## 2022-03-15 ENCOUNTER — Encounter: Payer: 59 | Admitting: Physician Assistant

## 2022-03-18 ENCOUNTER — Ambulatory Visit: Payer: 59

## 2022-03-27 NOTE — Unmapped (Signed)
[facility] - Labs Rec'd 03/26/2022

## 2022-04-26 ENCOUNTER — Other Ambulatory Visit: Payer: Self-pay | Admitting: Primary Care

## 2022-04-26 DIAGNOSIS — E1165 Type 2 diabetes mellitus with hyperglycemia: Secondary | ICD-10-CM

## 2022-04-28 ENCOUNTER — Other Ambulatory Visit: Payer: Self-pay | Admitting: Primary Care

## 2022-04-28 DIAGNOSIS — E1165 Type 2 diabetes mellitus with hyperglycemia: Secondary | ICD-10-CM

## 2022-05-31 ENCOUNTER — Encounter: Payer: Self-pay | Admitting: Primary Care

## 2022-05-31 ENCOUNTER — Ambulatory Visit (INDEPENDENT_AMBULATORY_CARE_PROVIDER_SITE_OTHER): Payer: 59 | Admitting: Primary Care

## 2022-05-31 VITALS — BP 134/76 | HR 80 | Temp 98.1°F | Ht 71.0 in | Wt 350.0 lb

## 2022-05-31 DIAGNOSIS — E1165 Type 2 diabetes mellitus with hyperglycemia: Secondary | ICD-10-CM

## 2022-05-31 DIAGNOSIS — Z794 Long term (current) use of insulin: Secondary | ICD-10-CM | POA: Diagnosis not present

## 2022-05-31 LAB — POCT GLYCOSYLATED HEMOGLOBIN (HGB A1C): Hemoglobin A1C: 9.4 % — AB (ref 4.0–5.6)

## 2022-05-31 MED ORDER — DEXCOM G7 RECEIVER DEVI: 3 refills | Status: DC

## 2022-05-31 MED ORDER — DEXCOM G7 SENSOR MISC: 3 refills | Status: DC

## 2022-05-31 MED ORDER — LANTUS SOLOSTAR 100 UNIT/ML ~~LOC~~ SOPN
35.0000 [IU] | PEN_INJECTOR | Freq: Every day | SUBCUTANEOUS | 1 refills | Status: DC
Start: 2022-05-31 — End: 2022-10-23

## 2022-05-31 MED ORDER — TIRZEPATIDE 2.5 MG/0.5ML ~~LOC~~ SOAJ: 2.5000 mg | SUBCUTANEOUS | 0 refills | Status: DC

## 2022-05-31 NOTE — Progress Notes (Signed)
Subjective:    Patient ID: [name], male    DOB: 07-Jun-1980, 42 y.o.   MRN: [id number]  Diabetes Associated symptoms include polydipsia. Pertinent negatives for diabetes include no chest pain, no polyphagia and no polyuria.    [name] is a very pleasant 42 y.o. male  has a past medical history of Blood in stool, Depression, Elevated blood pressure, Hyperlipidemia, OSA (obstructive sleep apnea) (2014), Seasonal allergies, and Ulcerative colitis ([place]) (03/05/2018). who presents today for follow up of diabetes.  Current medications include: metformin 0998 mg BID, Trulicity 4.5 mg weekly, Lantus 30 units HS. He has increased his Lantus up to 40 units with elevated glucose readings in the high 300's which he's seeing twice weekly.   He quit smoking so his appetite has returned.   He is checking his blood glucose continuously and is getting readings :  AM fasting: low 200's Before lunch: mid 200's  Before dinner: high 200's  He is interested in Dexcom 7. He has Dexcom 6 now.  Last A1C: 8.3 in October 2023, 9.4 today.  Last Eye Exam: UTD Last Foot Exam: UTD Pneumonia Vaccination: Never completed  Urine Microalbumin: UTD Statin: atorvastatin   Dietary changes since last visit: Increased appetite and increased frequency of meals times.    Exercise: None.   Wt Readings from Last 3 Encounters:  05/31/22 (!) 350 lb (158.8 kg)  02/28/22 (!) 349 lb (158.3 kg)  11/22/21 (!) 359 lb (162.8 kg)      BP Readings from Last 3 Encounters:  05/31/22 134/76  02/28/22 132/62  11/22/21 126/82      Review of Systems  Respiratory:  Negative for shortness of breath.   Cardiovascular:  Negative for chest pain.  Endocrine: Positive for polydipsia. Negative for polyphagia and polyuria.  Neurological:  Negative for numbness.         Past Medical History:  Diagnosis Date   Blood in stool    Depression    Elevated blood pressure    Hyperlipidemia    OSA (obstructive sleep  apnea) 2014   Seasonal allergies    Ulcerative colitis ([place]) 03/05/2018    Social History   Socioeconomic History   Marital status: Married    Spouse name: Not on file   Number of children: Not on file   Years of education: Not on file   Highest education level: Not on file  Occupational History   Not on file  Tobacco Use   Smoking status: Every Day    Packs/day: 1.00    Years: 14.00    Total pack years: 14.00    Types: Cigarettes    Last attempt to quit: 11/25/2018    Years since quitting: 3.5   Smokeless tobacco: Former    Quit date: 03/17/2015  Vaping Use   Vaping Use: Never used  Substance and Sexual Activity   Alcohol use: Not Currently    Alcohol/week: 0.0 standard drinks of alcohol    Comment: soical   Drug use: Not on file   Sexual activity: Not on file  Other Topics Concern   Not on file  Social History Narrative   Married.   7 kids.   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   Enjoys target shooting.    Social Determinants of Health   Financial Resource Strain: Not on file  Food Insecurity: Not on file  Transportation Needs: Not on file  Physical Activity: Not on file  Stress: Not on file  Social Connections: Not  on file  Intimate Partner Violence: Not on file    Past Surgical History:  Procedure Laterality Date   SEPTOPLASTY  2007   WRIST SURGERY      Family History  Problem Relation Age of Onset   Multiple sclerosis Mother    Dementia Mother    Alcohol abuse Paternal Aunt    Alcohol abuse Paternal Uncle    Stroke Paternal Uncle    Hyperlipidemia Maternal Grandfather    Hypertension Maternal Grandfather    Diabetes Maternal Grandfather    Alcohol abuse Maternal Grandfather    Breast cancer Neg Hx     Allergies  Allergen Reactions   Biaxin [Clarithromycin] Other (See Comments)    Causes colitis flares   Milk-Related Compounds Hives    Current Outpatient Medications on File Prior to Visit  Medication Sig Dispense Refill    acetaminophen (TYLENOL) 500 MG tablet Take 1,000 mg by mouth every 6 (six) hours as needed for headache (pain).     atorvastatin (LIPITOR) 40 MG tablet TAKE 1 TABLET(40 MG) BY MOUTH DAILY FOR CHOLESTEROL 90 tablet 3   blood glucose meter kit and supplies KIT Dispense based on patient and insurance preference. Use up to four times daily as directed. (FOR ICD-9 250.00, 250.01). 1 each 0   clobetasol ointment (TEMOVATE) 0.05 % Apply 1 application  topically 2 (two) times daily.     Continuous Blood Gluc Receiver (DEXCOM G6 RECEIVER) DEVI 1 Device by Does not apply route continuous. 1 each 0   Continuous Blood Gluc Sensor (DEXCOM G6 SENSOR) MISC APPLY 1 SENSOR INTO THE SKIN EVERY 10 DAYS 3 each 5   Continuous Blood Gluc Transmit (DEXCOM G6 TRANSMITTER) MISC Use continuously to check blood sugars. 1 each 1   hydrocortisone 2.5 % ointment Apply topically.     ibuprofen (ADVIL,MOTRIN) 200 MG tablet Take 400-600 mg by mouth every 6 (six) hours as needed for headache (pain).     inFLIXimab (REMICADE) 100 MG injection Inject into the muscle once a week.     Insulin Pen Needle (RELION PEN NEEDLES) 31G X 6 MM MISC USE 1  AT NIGHT WITH  INSULIN  AS  DIRECTED. 300 each 0   ketoconazole (NIZORAL) 2 % cream Apply topically 2 (two) times daily.     metFORMIN (GLUCOPHAGE) 1000 MG tablet TAKE 1 TABLET BY MOUTH TWICE DAILY WITH MEALS FOR DIABETES. 180 tablet 1   venlafaxine XR (EFFEXOR-XR) 150 MG 24 hr capsule Take 1 capsule (150 mg total) by mouth daily with breakfast. For anxiety and depression. 90 capsule 2   triamcinolone ointment (KENALOG) 0.1 % Apply topically. (Patient not taking: Reported on 05/31/2022)     No current facility-administered medications on file prior to visit.    BP 134/76   Pulse 80   Temp 98.1 F (36.7 C) (Temporal)   Ht 5\' 11"  (1.803 m)   Wt (!) 350 lb (158.8 kg)   SpO2 98%   BMI 48.82 kg/m  Objective:   Physical Exam Cardiovascular:     Rate and Rhythm: Normal rate and regular  rhythm.  Pulmonary:     Effort: Pulmonary effort is normal.     Breath sounds: Normal breath sounds. No wheezing or rales.  Musculoskeletal:     Cervical back: Neck supple.  Skin:    General: Skin is warm and dry.  Neurological:     Mental Status: He is alert and oriented to person, place, and time.  Assessment & Plan:  Type 2 diabetes mellitus with hyperglycemia, with long-term current use of insulin ([facility]) Assessment & Plan: Deteriorated with A1C of 9.4 today.  Commended him on smoking cessation, however, this has caused increased appetite.   Continue metformin 1000 mg BID. Increase Lantus to 35 units daily. Stop Trulicity 4.5 mg weekly.  Start tirzepitide Darcel Bayley) for diabetes/weight loss. Start by injecting 2.5 mg into the skin once weekly for 4 weeks, then increase to 5 mg once weekly thereafter.   Close follow up in 6 weeks.   Orders: -     POCT glycosylated hemoglobin (Hb A1C) -     Tirzepatide; Inject 2.5 mg into the skin once a week. for diabetes.  Dispense: 2 mL; Refill: 0 -     Dexcom G7 Receiver; Use to check blood sugars.  Dispense: 1 each; Refill: 3 -     Dexcom G7 Sensor; Apply every 10 days to check blood sugars.  Dispense: 3 each; Refill: 3 -     Lantus SoloStar; Inject 35 Units into the skin daily.  Dispense: 30 mL; Refill: 1        Pleas Koch, NP

## 2022-05-31 NOTE — Patient Instructions (Signed)
We increased your dose of Lantus to 35 units daily for diabetes.  Stop Trulicity.  Start tirzepitide Darcel Bayley) for diabetes/weight loss. Start by injecting 2.5 mg into the skin once weekly for 4 weeks, then increase to 5 mg once weekly thereafter. Please notify me once you've used your last 2.5 mg pen so that I can prescribe the next dose.   Please schedule a follow up visit for 6 weeks for follow up of diabetes.  It was a pleasure to see you today!

## 2022-05-31 NOTE — Assessment & Plan Note (Signed)
Deteriorated with A1C of 9.4 today.  Commended him on smoking cessation, however, this has caused increased appetite.   Continue metformin 1000 mg BID. Increase Lantus to 35 units daily. Stop Trulicity 4.5 mg weekly.  Start tirzepitide Darcel Bayley) for diabetes/weight loss. Start by injecting 2.5 mg into the skin once weekly for 4 weeks, then increase to 5 mg once weekly thereafter.   Close follow up in 6 weeks.

## 2022-06-04 ENCOUNTER — Other Ambulatory Visit: Payer: Self-pay | Admitting: Primary Care

## 2022-06-04 DIAGNOSIS — E119 Type 2 diabetes mellitus without complications: Secondary | ICD-10-CM

## 2022-06-17 ENCOUNTER — Other Ambulatory Visit (HOSPITAL_COMMUNITY): Payer: Self-pay

## 2022-06-17 ENCOUNTER — Telehealth: Payer: Self-pay

## 2022-06-17 NOTE — Telephone Encounter (Signed)
Pharmacy Patient Advocate Encounter   Received notification from [facility] that prior authorization for [place] receiver is required/requested.  Per Test Claim: Prior authorization required   PA submitted on 06/17/22 to (ins) [employer] via CoverMyMeds Key [id number] Status is pending

## 2022-06-19 NOTE — Unmapped (Signed)
Appointment on 02/07 with Dr Dalene Carrow,  Toni Amend

## 2022-06-19 NOTE — Unmapped (Signed)
[facility]

## 2022-06-19 NOTE — Unmapped (Signed)
[facility] have been scanned into Careers information officer

## 2022-06-20 ENCOUNTER — Other Ambulatory Visit (HOSPITAL_COMMUNITY): Payer: Self-pay

## 2022-06-20 NOTE — Telephone Encounter (Signed)
Called and notified patient that PA for Dexcom receiver was approved. He stated he did not need the receiver, he uses his phone to check it. Called [facility] and canceled the prescription for the [facility] receiver.

## 2022-06-20 NOTE — Telephone Encounter (Signed)
Patient Advocate Encounter  Prior Authorization for [employer] receiver has been approved.    PA# [id number] - PA Case ID: [id number]  Effective dates: 06/20/22 through 06/18/23  Placed a call to they pharmacy to notify of the approval. Pharmacist stated the co-pay came back as $160.00.

## 2022-06-26 ENCOUNTER — Ambulatory Visit
Admit: 2022-06-26 | Discharge: 2022-06-27 | Payer: PRIVATE HEALTH INSURANCE | Attending: Student in an Organized Health Care Education/Training Program | Primary: Student in an Organized Health Care Education/Training Program

## 2022-06-26 DIAGNOSIS — L88 Pyoderma gangrenosum: Principal | ICD-10-CM

## 2022-06-26 DIAGNOSIS — Z79899 Other long term (current) drug therapy: Principal | ICD-10-CM

## 2022-06-26 MED ADMIN — triamcinolone acetonide (KENALOG-40) injection 40 mg: 40 mg | INTRALESIONAL | @ 18:00:00 | Stop: 2022-06-26

## 2022-06-26 NOTE — Unmapped (Signed)
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  - ILK as below    Intralesional Kenalog Procedure Note: After the patient was informed of risks (including atrophy and dyspigmentation), benefits and side effects of intralesional steroid injection, the patient elected to undergo injection and verbal consent was obtained. Skin was cleaned with alcohol and injected intradermally into the sites (below). The patient tolerated the procedure well without complications and was instructed on post-procedure care.  Location(s): left lower leg  Number of sites treated: 1  Kenalog (triamcinolone) Concentration: 40 mg/ml   Volume: 0.4 ml total     High risk medication use: infliximab  - hepatitis panel (HBV cAb / sAb and HCV) negative 07/2019 -- HBV non-immune  - cbc and cmp with infusions    The patient was advised to call for an appointment should any new, changing, or symptomatic lesions develop.     RTC: Return in about 5 months (around 11/24/2022). or sooner as needed   _________________________________________________________________      Chief Complaint     Chief Complaint   Patient presents with    Follow-up     Things are doing good.        HPI     Andrew Cameron is a 42 y.o. male who presents as a returning patient (last seen No previous visit found) for follow up of pyoderma gangrenosum. Has improved considerably, is finally healed over. Residual very minor tenderness to palpation.    The patient denies any other new or changing lesions or areas of concern.     Pertinent Past Medical History     Ulcerative colitis    Past Medical History, Family History, Social History, Medication List, Allergies, and Problem List were reviewed in the rooming section of Epic.     ROS: Other than symptoms mentioned in the HPI, no fevers, chills, or other skin complaints    Physical Examination     GENERAL: Well-appearing male in no acute distress, resting comfortably.  NEURO: Alert and oriented, answers questions appropriately  PSYCH: Normal mood and affect  EYES: lids clear, conjunctiva pink, no scleral icterus or other color change  RESP: No increased work of breathing  SKIN (Focal Skin Exam): Per patient request, examination of left lower leg was performed  Hyperpigmented scar with central area of hypertrophy and mild erythema without any erosion or ulceration    All areas not commented on are within normal limits or unremarkable    This patient was discussed and seen with attending physician Whitney Post, MD

## 2022-07-04 NOTE — Unmapped (Signed)
I saw and evaluated the patient, participating in the key elements of the service.  I discussed the findings, assessment and plan with the Resident and agree with the Resident’s findings and plan as documented in the Resident’s note.  I was present for the entirety of procedures taking less than 5 minutes and was present for the key and critical portions and immediately available for the entirety of procedure(s) taking 5 or more minutes.     Gurman Ashland V Denijah Karrer, MD PhD

## 2022-07-12 ENCOUNTER — Ambulatory Visit: Payer: 59 | Admitting: Primary Care

## 2022-08-07 ENCOUNTER — Ambulatory Visit: Payer: 59 | Admitting: Primary Care

## 2022-08-08 ENCOUNTER — Encounter: Payer: 59 | Admitting: Primary Care

## 2022-08-09 ENCOUNTER — Encounter: Payer: Self-pay | Admitting: Primary Care

## 2022-08-20 ENCOUNTER — Encounter: Payer: Self-pay | Admitting: Nurse Practitioner

## 2022-08-20 ENCOUNTER — Ambulatory Visit: Payer: 59 | Admitting: Nurse Practitioner

## 2022-08-20 NOTE — Progress Notes (Deleted)
Tomasita Morrow, NP-C Phone: [phone]  Traymon Goedeke is a 42 y.o. male who presents today for ***  Respiratory illness:  Cough- ***  Congestion- ***   Sinus- ***   Chest- ***  Post nasal drip- ***  Sore throat- ***  Shortness of breath- ***  Fever- ***  Fatigue/Myalgia- *** Headache- *** Nausea/Vomiting- *** Taste disturbance- ***  Smell disturbance- ***  Covid exposure- ***  Covid vaccination- ***  Flu vaccination- ***  Medications- ***   Social History   Tobacco Use  Smoking Status Every Day   Packs/day: 1.00   Years: 14.00   Additional pack years: 0.00   Total pack years: 14.00   Types: Cigarettes   Last attempt to quit: 11/25/2018   Years since quitting: 3.7  Smokeless Tobacco Former   Quit date: 03/17/2015    Current Outpatient Medications on File Prior to Visit  Medication Sig Dispense Refill   acetaminophen (TYLENOL) 500 MG tablet Take 1,000 mg by mouth every 6 (six) hours as needed for headache (pain).     atorvastatin (LIPITOR) 40 MG tablet TAKE 1 TABLET(40 MG) BY MOUTH DAILY FOR CHOLESTEROL 90 tablet 3   blood glucose meter kit and supplies KIT Dispense based on patient and insurance preference. Use up to four times daily as directed. (FOR ICD-9 250.00, 250.01). 1 each 0   clobetasol ointment (TEMOVATE) [id number] % Apply 1 application  topically 2 (two) times daily.     Continuous Blood Gluc Receiver (DEXCOM G6 RECEIVER) DEVI 1 Device by Does not apply route continuous. 1 each 0   Continuous Blood Gluc Receiver (DEXCOM G7 RECEIVER) DEVI Use to check blood sugars. 1 each 3   Continuous Blood Gluc Sensor (DEXCOM G6 SENSOR) MISC APPLY 1 SENSOR INTO THE SKIN EVERY 10 DAYS 3 each 5   Continuous Blood Gluc Sensor (DEXCOM G7 SENSOR) MISC Apply every 10 days to check blood sugars. 3 each 3   Continuous Blood Gluc Transmit (DEXCOM G6 TRANSMITTER) MISC Use continuously to check blood sugars. 1 each 1   hydrocortisone 2.5 % ointment Apply topically.     ibuprofen  (ADVIL,MOTRIN) 200 MG tablet Take 400-600 mg by mouth every 6 (six) hours as needed for headache (pain).     inFLIXimab (REMICADE) 100 MG injection Inject into the muscle once a week.     Insulin Pen Needle (RELION PEN NEEDLES) 31G X 6 MM MISC USE 1  AT NIGHT WITH  INSULIN  AS  DIRECTED. 300 each 0   ketoconazole (NIZORAL) 2 % cream Apply topically 2 (two) times daily.     LANTUS SOLOSTAR 100 UNIT/ML Solostar Pen Inject 35 Units into the skin daily. 30 mL 1   metFORMIN (GLUCOPHAGE) 1000 MG tablet TAKE 1 TABLET BY MOUTH TWICE DAILY WITH MEALS FOR DIABETES 180 tablet 1   tirzepatide (MOUNJARO) 2.5 MG/0.5ML Pen Inject 2.5 mg into the skin once a week. for diabetes. 2 mL 0   triamcinolone ointment (KENALOG) 0.1 % Apply topically. (Patient not taking: Reported on 05/31/2022)     venlafaxine XR (EFFEXOR-XR) 150 MG 24 hr capsule Take 1 capsule (150 mg total) by mouth daily with breakfast. For anxiety and depression. 90 capsule 2   No current facility-administered medications on file prior to visit.     ROS see history of present illness  Objective  Physical Exam There were no vitals filed for this visit.  BP Readings from Last 3 Encounters:  05/31/22 134/76  02/28/22 132/62  11/22/21 126/82   Wt Readings  from Last 3 Encounters:  05/31/22 (!) 350 lb (158.8 kg)  02/28/22 (!) 349 lb (158.3 kg)  11/22/21 (!) 359 lb (162.8 kg)    Physical Exam   Assessment/Plan: Please see individual problem list.  There are no diagnoses linked to this encounter.   Health Maintenance: ***  No follow-ups on file.   Tomasita Morrow, NP-C [place]

## 2022-08-21 ENCOUNTER — Ambulatory Visit: Payer: 59 | Admitting: Family Medicine

## 2022-08-21 ENCOUNTER — Ambulatory Visit: Payer: 59 | Admitting: Family

## 2022-09-11 ENCOUNTER — Other Ambulatory Visit: Payer: Self-pay | Admitting: Primary Care

## 2022-09-11 DIAGNOSIS — F3342 Major depressive disorder, recurrent, in full remission: Secondary | ICD-10-CM

## 2022-09-13 NOTE — Unmapped (Signed)
Derm labs

## 2022-09-18 NOTE — Unmapped (Signed)
Call from [place] with [facility]. LVM on nurse line stating needs a letter of medical necessity for DOS 01/01/22 due to increasing infusions from every 6 weeks to every 4 weeks.  Call her back for more clarification of what is needed to [phone].

## 2022-09-19 NOTE — Unmapped (Signed)
Return TC to [employer] at [employer]. Reached voicemail and left message informing that we have maintained Mr. Heady on every 6 week dosing and did not switch to every 4 week dosing.   Asked for return call to confirm that message is received.

## 2022-09-24 ENCOUNTER — Ambulatory Visit: Payer: 59 | Admitting: Primary Care

## 2022-09-25 ENCOUNTER — Ambulatory Visit (INDEPENDENT_AMBULATORY_CARE_PROVIDER_SITE_OTHER): Payer: 59 | Admitting: Primary Care

## 2022-09-25 ENCOUNTER — Encounter: Payer: Self-pay | Admitting: Primary Care

## 2022-09-25 VITALS — BP 138/80 | HR 87 | Temp 98.2°F | Ht 71.0 in | Wt 333.0 lb

## 2022-09-25 DIAGNOSIS — Z794 Long term (current) use of insulin: Secondary | ICD-10-CM | POA: Diagnosis not present

## 2022-09-25 DIAGNOSIS — E1165 Type 2 diabetes mellitus with hyperglycemia: Secondary | ICD-10-CM

## 2022-09-25 DIAGNOSIS — Z720 Tobacco use: Secondary | ICD-10-CM

## 2022-09-25 LAB — POCT GLYCOSYLATED HEMOGLOBIN (HGB A1C): Hemoglobin A1C: 8.5 % — AB (ref 4.0–5.6)

## 2022-09-25 MED ORDER — BUPROPION HCL ER (SR) 100 MG PO TB12: 100.0000 mg | ORAL_TABLET | Freq: Two times a day (BID) | ORAL | 0 refills | Status: DC

## 2022-09-25 MED ORDER — TIRZEPATIDE 5 MG/0.5ML ~~LOC~~ SOAJ: 5.0000 mg | SUBCUTANEOUS | 0 refills | Status: DC

## 2022-09-25 NOTE — Assessment & Plan Note (Signed)
Ready to quit again.  Start bupropion SR 100 mg daily x 3-4 days, then increase to 100 mg BID thereafter.

## 2022-09-25 NOTE — Assessment & Plan Note (Signed)
Improved with A1C today of 8.5, not at goal. Would like to see him <7.  Increase Mounjaro to 5 mg weekly x 4 weeks, increase to 7.5 mg thereafter.  Continue Lantus 40 units daily. Continue metformin 1000 mg BID.  Follow up in 3 months.

## 2022-09-25 NOTE — Patient Instructions (Addendum)
We increased your Mounjaro to 5 mg weekly. Message me once you use your last 5 mg pen and I will increase the dose to 7.5 mg.  Start bupropion SR 100 mg for smoking. Take 1 tablet once daily x 3 days, then increase to twice daily thereafter.   Please schedule a physical to meet with me in 3 months.   It was a pleasure to see you today!

## 2022-09-25 NOTE — Progress Notes (Signed)
Subjective:    Patient ID: [name], male    DOB: [birth year], 42 y.o.   MRN: [id number]  HPI  Graylan Ballerini is a very pleasant 42 y.o. male with a history of type 2 diabetes, hypertension, ulcerative colitis, pyoderma gangrenosum, hyperlipidemia who presents today for follow up of diabetes and to discuss tobacco cessation.   1) Type 2 Diabetes: Current medications include: Lantus 40 units daily, metformin 1000 mg BID, Mounjaro 2.5 mg weekly.  He completed his last Mounjaro 2.5 mg dose about 10 days ago as he was working on [employer]. He denies GI upset, heartburn.   He is checking his blood glucose continuously and is getting:  AM fasting 130's-140's 2 hours after lunch: 160's-170's Bedtime: low 200's  Last A1C: 9.4 in January 2024, 8.5 today. Last Eye Exam: UTD Last Foot Exam: UTD Pneumonia Vaccination: UTD Urine Microalbumin: UTD Statin: atorvastatin   Dietary changes since last visit: He has been cutting back on fast food.    Exercise: Active.   Wt Readings from Last 3 Encounters:  09/25/22 (!) 333 lb (151 kg)  05/31/22 (!) 350 lb (158.8 kg)  02/28/22 (!) 349 lb (158.3 kg)   2) Tobacco Use: Quit smoking in late September 2023 with a one month supply of bupropion SR 100 mg BID.   He resumed smoking in February 2024 when his mother went on hospice.He is currently smoking 1-2 packs per day.Since then he's noticed chronic cough, chest congestion. He denies fevers, chills.  He would like to re-try bupropion as he was successful on this in the past.    Review of Systems  Eyes:  Negative for visual disturbance.  Respiratory:  Positive for cough and shortness of breath.   Cardiovascular:  Negative for chest pain.  Gastrointestinal:  Negative for abdominal pain and nausea.  Neurological:  Negative for numbness.         Past Medical History:  Diagnosis Date   Blood in stool    Depression    Elevated blood pressure    Hyperlipidemia    OSA (obstructive  sleep apnea) 2014   Seasonal allergies    Ulcerative colitis (HCC) 03/05/2018    Social History   Socioeconomic History   Marital status: Married    Spouse name: Not on file   Number of children: Not on file   Years of education: Not on file   Highest education level: Not on file  Occupational History   Not on file  Tobacco Use   Smoking status: Every Day    Packs/day: 1.00    Years: 14.00    Additional pack years: 0.00    Total pack years: 14.00    Types: Cigarettes    Last attempt to quit: 11/25/2018    Years since quitting: 3.8   Smokeless tobacco: Former    Quit date: 03/17/2015  Vaping Use   Vaping Use: Never used  Substance and Sexual Activity   Alcohol use: Not Currently    Alcohol/week: 0.0 standard drinks of alcohol    Comment: soical   Drug use: Not on file   Sexual activity: Not on file  Other Topics Concern   Not on file  Social History Narrative   Married.   7 kids.   Works as a Magazine features editor at [employer].   Enjoys target shooting.    Social Determinants of Health   Financial Resource Strain: Not on file  Food Insecurity: Not on file  Transportation Needs: Not on file  Physical Activity: Not on file  Stress: Not on file  Social Connections: Not on file  Intimate Partner Violence: Not on file    Past Surgical History:  Procedure Laterality Date   SEPTOPLASTY  2007   WRIST SURGERY      Family History  Problem Relation Age of Onset   Multiple sclerosis Mother    Dementia Mother    Alcohol abuse Paternal Aunt    Alcohol abuse Paternal Uncle    Stroke Paternal Uncle    Hyperlipidemia Maternal Grandfather    Hypertension Maternal Grandfather    Diabetes Maternal Grandfather    Alcohol abuse Maternal Grandfather    Breast cancer Neg Hx     Allergies  Allergen Reactions   Biaxin [Clarithromycin] Other (See Comments)    Causes colitis flares   Milk-Related Compounds Hives    Current Outpatient Medications on File Prior to Visit   Medication Sig Dispense Refill   acetaminophen (TYLENOL) 500 MG tablet Take 1,000 mg by mouth every 6 (six) hours as needed for headache (pain).     atorvastatin (LIPITOR) 40 MG tablet TAKE 1 TABLET(40 MG) BY MOUTH DAILY FOR CHOLESTEROL 90 tablet 3   blood glucose meter kit and supplies KIT Dispense based on patient and insurance preference. Use up to four times daily as directed. (FOR ICD-9 250.00, 250.01). 1 each 0   Continuous Blood Gluc Receiver (DEXCOM G6 RECEIVER) DEVI 1 Device by Does not apply route continuous. 1 each 0   Continuous Blood Gluc Receiver (DEXCOM G7 RECEIVER) DEVI Use to check blood sugars. 1 each 3   Continuous Blood Gluc Sensor (DEXCOM G6 SENSOR) MISC APPLY 1 SENSOR INTO THE SKIN EVERY 10 DAYS 3 each 5   Continuous Blood Gluc Sensor (DEXCOM G7 SENSOR) MISC Apply every 10 days to check blood sugars. 3 each 3   Continuous Blood Gluc Transmit (DEXCOM G6 TRANSMITTER) MISC Use continuously to check blood sugars. 1 each 1   inFLIXimab (REMICADE) 100 MG injection Inject into the muscle once a week.     Insulin Pen Needle (RELION PEN NEEDLES) 31G X 6 MM MISC USE 1  AT NIGHT WITH  INSULIN  AS  DIRECTED. 300 each 0   ketoconazole (NIZORAL) 2 % cream Apply topically 2 (two) times daily.     LANTUS SOLOSTAR 100 UNIT/ML Solostar Pen Inject 35 Units into the skin daily. 30 mL 1   metFORMIN (GLUCOPHAGE) 1000 MG tablet TAKE 1 TABLET BY MOUTH TWICE DAILY WITH MEALS FOR DIABETES 180 tablet 1   venlafaxine XR (EFFEXOR-XR) 150 MG 24 hr capsule TAKE 1 CAPSULE(150 MG) BY MOUTH DAILY WITH BREAKFAST FOR ANXIETY OR DEPRESSION 90 capsule 0   clobetasol ointment (TEMOVATE) 0.05 % Apply 1 application  topically 2 (two) times daily. (Patient not taking: Reported on 09/25/2022)     hydrocortisone 2.5 % ointment Apply topically. (Patient not taking: Reported on 09/25/2022)     ibuprofen (ADVIL,MOTRIN) 200 MG tablet Take 400-600 mg by mouth every 6 (six) hours as needed for headache (pain). (Patient not  taking: Reported on 09/25/2022)     triamcinolone ointment (KENALOG) 0.1 % Apply topically. (Patient not taking: Reported on 05/31/2022)     No current facility-administered medications on file prior to visit.    BP 138/80   Pulse 87   Temp 98.2 F (36.8 C) (Temporal)   Ht 5\' 11"  (1.803 m)   Wt (!) 333 lb (151 kg)   SpO2 97%   BMI 46.44 kg/m  Objective:  Physical Exam Cardiovascular:     Rate and Rhythm: Normal rate and regular rhythm.  Pulmonary:     Effort: Pulmonary effort is normal.     Breath sounds: Normal breath sounds. No wheezing or rales.  Musculoskeletal:     Cervical back: Neck supple.  Skin:    General: Skin is warm and dry.  Neurological:     Mental Status: He is alert and oriented to person, place, and time.           Assessment & Plan:  Type 2 diabetes mellitus with hyperglycemia, with long-term current use of insulin (HCC) Assessment & Plan: Improved with A1C today of 8.5, not at goal. Would like to see him <7.  Increase Mounjaro to 5 mg weekly x 4 weeks, increase to 7.5 mg thereafter.  Continue Lantus 40 units daily. Continue metformin 1000 mg BID.  Follow up in 3 months.   Orders: -     POCT glycosylated hemoglobin (Hb A1C) -     Tirzepatide; Inject 5 mg into the skin once a week. for diabetes.  Dispense: 2 mL; Refill: 0  Tobacco abuse Assessment & Plan: Ready to quit again.  Start bupropion SR 100 mg daily x 3-4 days, then increase to 100 mg BID thereafter.   Orders: -     buPROPion HCl ER (SR); Take 1 tablet (100 mg total) by mouth 2 (two) times daily.  Dispense: 180 tablet; Refill: 0        [name], NP

## 2022-09-27 DIAGNOSIS — E1165 Type 2 diabetes mellitus with hyperglycemia: Secondary | ICD-10-CM

## 2022-09-30 MED ORDER — TIRZEPATIDE 2.5 MG/0.5ML ~~LOC~~ SOAJ: 5.0000 mg | SUBCUTANEOUS | 0 refills | Status: DC

## 2022-10-10 ENCOUNTER — Telehealth: Payer: Self-pay

## 2022-10-10 NOTE — Telephone Encounter (Signed)
PA approved.   Request Reference Number: [id number]. MOUNJARO INJ 2.5/0.5 is approved through 10/10/2023. Your patient may now fill this prescription and it will be covered. Authorization Expiration Date: 10/10/2023

## 2022-10-10 NOTE — Telephone Encounter (Signed)
PA initiated via Covermymeds; KEY: [id number]. Awaiting determination.

## 2022-10-22 MED ORDER — DEXCOM G6 TRANSMITTER MISC: 1 refills | Status: DC

## 2022-10-22 MED ORDER — DEXCOM G6 SENSOR MISC: 5 refills | Status: DC

## 2022-10-23 ENCOUNTER — Telehealth: Payer: Self-pay

## 2022-10-23 DIAGNOSIS — E1165 Type 2 diabetes mellitus with hyperglycemia: Secondary | ICD-10-CM

## 2022-10-23 MED ORDER — LANTUS SOLOSTAR 100 UNIT/ML ~~LOC~~ SOPN
40.0000 [IU] | PEN_INJECTOR | Freq: Every day | SUBCUTANEOUS | 0 refills | Status: DC
Start: 2022-10-23 — End: 2023-01-21

## 2022-10-23 NOTE — Addendum Note (Signed)
Addended by: [name] on: 10/23/2022 12:53 PM   Modules accepted: Orders

## 2022-10-23 NOTE — Telephone Encounter (Signed)
Refills sent to pharmacy. 

## 2022-10-29 ENCOUNTER — Ambulatory Visit (INDEPENDENT_AMBULATORY_CARE_PROVIDER_SITE_OTHER): Payer: 59 | Admitting: Primary Care

## 2022-10-29 ENCOUNTER — Encounter: Payer: Self-pay | Admitting: Primary Care

## 2022-10-29 VITALS — BP 132/76 | HR 90 | Temp 98.4°F | Ht 71.0 in | Wt 333.0 lb

## 2022-10-29 DIAGNOSIS — M545 Low back pain, unspecified: Secondary | ICD-10-CM | POA: Diagnosis not present

## 2022-10-29 DIAGNOSIS — M542 Cervicalgia: Secondary | ICD-10-CM

## 2022-10-29 HISTORY — DX: Low back pain, unspecified: M54.50

## 2022-10-29 HISTORY — DX: Cervicalgia: M54.2

## 2022-10-29 MED ORDER — CYCLOBENZAPRINE HCL 5 MG PO TABS: 5.0000 mg | ORAL_TABLET | Freq: Three times a day (TID) | ORAL | 0 refills | Status: DC | PRN

## 2022-10-29 NOTE — Assessment & Plan Note (Signed)
No alarm signs on exam and during HPI.  Discussed instructions for stretching, walking, rest.  Prescription for cyclobenzaprine 5 mg sent to pharmacy.  Take 5 to 10 mg every 8 hours as needed.  Drowsiness precautions provided.  Consider physical therapy if no improvement.

## 2022-10-29 NOTE — Patient Instructions (Signed)
You may take cyclobenzaprine 5 mg tablets as needed for muscle spasms.  Take 1 to 2 tablets by mouth every 8 hours as needed.  This may cause drowsiness.  Try to stretch and rest when able.  You can apply ice or heat for comfort.  Let me know if there is no gradual improvement in your symptoms.  It was a pleasure to see you today!

## 2022-10-29 NOTE — Assessment & Plan Note (Signed)
HPI and exam today consistent for muscular involvement. No alarm signs.  Start cyclobenzaprine 5 mg.  Take 1 to 2 tablets by mouth every 8 hours as needed.  Drowsiness precautions provided. Also discussed ice, heat, stretching.

## 2022-10-29 NOTE — Progress Notes (Signed)
Subjective:    Patient ID: [name], male    DOB: 01-Sep-1980, 42 y.o.   MRN: [id number]  Back Pain Pertinent negatives include no numbness or weakness.  Neck Pain  Pertinent negatives include no numbness or weakness.    [name] is a very pleasant 42 y.o. male with a history of hypertension, type 2 diabetes, morbid obesity, chronic left knee and left ankle pain, chronic lower extremity ulcer who presents today to discuss neck and back pain.  1) Acute Back Pain: Acute onset 1 month ago.  Prior to symptom onset he was moving into his home and doing a lot of painting.  Soon thereafter he developed pain to the mid lower back with occasional pain down the upper anterior thigh. He denies numbness, loss of bowel/bladder.   Symptoms are noticeable when first getting up in the morning but is worse in the late afternoon and evening. He describes his pain as a stiffness. He's been taking Lidocaine patches and Ibuprofen with temporary improvement.   Over the last few weeks he's been on his feet a lot more for work.   Wt Readings from Last 3 Encounters:  10/29/22 (!) 333 lb (151 kg)  09/25/22 (!) 333 lb (151 kg)  05/31/22 (!) 350 lb (158.8 kg)     2) Acute Neck Pain: Acute to the posterior neck with radiation down to the left posterior neck in between his shoulder blade. Symptoms began yesterday after moving a heavy piece of equipment.   He describes his pain as "tightness". He denies numbness/tingling, weakness.   Review of Systems  Musculoskeletal:  Positive for back pain, myalgias and neck pain.  Neurological:  Negative for weakness and numbness.         Past Medical History:  Diagnosis Date   Blood in stool    Depression    Elevated blood pressure    Hyperlipidemia    OSA (obstructive sleep apnea) 2014   Seasonal allergies    Ulcerative colitis (HCC) 03/05/2018    Social History   Socioeconomic History   Marital status: Married    Spouse name: Not on file   Number  of children: Not on file   Years of education: Not on file   Highest education level: Not on file  Occupational History   Not on file  Tobacco Use   Smoking status: Every Day    Packs/day: 1.00    Years: 14.00    Additional pack years: 0.00    Total pack years: 14.00    Types: Cigarettes    Last attempt to quit: 11/25/2018    Years since quitting: 3.9   Smokeless tobacco: Former    Quit date: 03/17/2015  Vaping Use   Vaping Use: Never used  Substance and Sexual Activity   Alcohol use: Not Currently    Alcohol/week: 0.0 standard drinks of alcohol    Comment: soical   Drug use: Not on file   Sexual activity: Not on file  Other Topics Concern   Not on file  Social History Narrative   Married.   7 kids.   Works as a Magazine features editor at [employer].   Enjoys target shooting.    Social Determinants of Health   Financial Resource Strain: Not on file  Food Insecurity: Not on file  Transportation Needs: Not on file  Physical Activity: Not on file  Stress: Not on file  Social Connections: Not on file  Intimate Partner Violence: Not on file    Past  Surgical History:  Procedure Laterality Date   SEPTOPLASTY  2007   WRIST SURGERY      Family History  Problem Relation Age of Onset   Multiple sclerosis Mother    Dementia Mother    Alcohol abuse Paternal Aunt    Alcohol abuse Paternal Uncle    Stroke Paternal Uncle    Hyperlipidemia Maternal Grandfather    Hypertension Maternal Grandfather    Diabetes Maternal Grandfather    Alcohol abuse Maternal Grandfather    Breast cancer Neg Hx     Allergies  Allergen Reactions   Biaxin [Clarithromycin] Other (See Comments)    Causes colitis flares   Milk-Related Compounds Hives    Current Outpatient Medications on File Prior to Visit  Medication Sig Dispense Refill   acetaminophen (TYLENOL) 500 MG tablet Take 1,000 mg by mouth every 6 (six) hours as needed for headache (pain).     atorvastatin (LIPITOR) 40 MG tablet TAKE 1  TABLET(40 MG) BY MOUTH DAILY FOR CHOLESTEROL 90 tablet 3   blood glucose meter kit and supplies KIT Dispense based on patient and insurance preference. Use up to four times daily as directed. (FOR ICD-9 250.00, 250.01). 1 each 0   buPROPion ER (WELLBUTRIN SR) 100 MG 12 hr tablet Take 1 tablet (100 mg total) by mouth 2 (two) times daily. 180 tablet 0   clobetasol ointment (TEMOVATE) 0.05 % Apply 1 application  topically 2 (two) times daily.     Continuous Blood Gluc Receiver (DEXCOM G6 RECEIVER) DEVI 1 Device by Does not apply route continuous. 1 each 0   Continuous Glucose Sensor (DEXCOM G6 SENSOR) MISC Used to check blood sugars continuously.  Change every 10 days. 3 each 5   Continuous Glucose Transmitter (DEXCOM G6 TRANSMITTER) MISC Use continuously to check blood sugars. 1 each 1   ibuprofen (ADVIL,MOTRIN) 200 MG tablet Take 400-600 mg by mouth every 6 (six) hours as needed for headache (pain).     inFLIXimab (REMICADE) 100 MG injection Inject into the muscle once a week.     Insulin Pen Needle (RELION PEN NEEDLES) 31G X 6 MM MISC USE 1  AT NIGHT WITH  INSULIN  AS  DIRECTED. 300 each 0   ketoconazole (NIZORAL) 2 % cream Apply topically 2 (two) times daily.     LANTUS SOLOSTAR 100 UNIT/ML Solostar Pen Inject 40 Units into the skin daily. 45 mL 0   metFORMIN (GLUCOPHAGE) 1000 MG tablet TAKE 1 TABLET BY MOUTH TWICE DAILY WITH MEALS FOR DIABETES 180 tablet 1   tirzepatide (MOUNJARO) 2.5 MG/0.5ML Pen Inject 5 mg into the skin once a week. for diabetes. 12 mL 0   venlafaxine XR (EFFEXOR-XR) 150 MG 24 hr capsule TAKE 1 CAPSULE(150 MG) BY MOUTH DAILY WITH BREAKFAST FOR ANXIETY OR DEPRESSION 90 capsule 0   hydrocortisone 2.5 % ointment Apply topically. (Patient not taking: Reported on 09/25/2022)     triamcinolone ointment (KENALOG) 0.1 % Apply topically. (Patient not taking: Reported on 05/31/2022)     No current facility-administered medications on file prior to visit.    BP 132/76   Pulse 90   Temp  98.4 F (36.9 C) (Temporal)   Ht 5\' 11"  (1.803 m)   Wt (!) 333 lb (151 kg)   SpO2 96%   BMI 46.44 kg/m  Objective:   Physical Exam Neck:     Comments: Decrease in ROM with left lateral rotation due to pain Musculoskeletal:     Cervical back: Muscular tenderness present. No spinous  process tenderness. Decreased range of motion.     Thoracic back: No bony tenderness. Normal range of motion.     Lumbar back: No bony tenderness. Normal range of motion. Negative right straight leg raise test and negative left straight leg raise test.       Back:           Assessment & Plan:  Acute midline low back pain without sciatica Assessment & Plan: No alarm signs on exam and during HPI.  Discussed instructions for stretching, walking, rest.  Prescription for cyclobenzaprine 5 mg sent to pharmacy.  Take 5 to 10 mg every 8 hours as needed.  Drowsiness precautions provided.  Consider physical therapy if no improvement.  Orders: -     Cyclobenzaprine HCl; Take 1-2 tablets (5-10 mg total) by mouth 3 (three) times daily as needed for muscle spasms.  Dispense: 30 tablet; Refill: 0  Acute neck pain Assessment & Plan: HPI and exam today consistent for muscular involvement. No alarm signs.  Start cyclobenzaprine 5 mg.  Take 1 to 2 tablets by mouth every 8 hours as needed.  Drowsiness precautions provided. Also discussed ice, heat, stretching.         [name], NP

## 2022-11-08 ENCOUNTER — Ambulatory Visit: Payer: 59 | Admitting: Primary Care

## 2022-11-11 ENCOUNTER — Encounter: Payer: Self-pay | Admitting: Primary Care

## 2022-11-25 NOTE — Unmapped (Signed)
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   - Reviewed expected chronic, waxing/waning nature. Goal is management rather than cure.  - Continue infliximab 10 mg/kg every 6 weeks. Pt currently getting infusions through [facility].     High risk medication use: infliximab  - hepatitis panel (HBV cAb / sAb and HCV) negative 07/2019 -- HBV non-immune  - Continue cbc and cmp with infusions  - Repeat quant gold today     Probable tinea pedis complicated by venous stasis dermatitis   - We discussed treatment of onychomycosis including observational management vs systemic vs topical. Discussed that while systemic therapy has the best chance at cure (about 80%), the cure rate of topical therapies is about one in ten. The patient would like to proceed with:  - Discussed condition is complicated by the veins in our legs becoming leaky and fluid, seeping into the tissue of your legs. This fluid can irritate the skin and cause swelling, itching, and redness.   - Informed the patient that this condition is caused by swelling in his legs and reducing this swelling is the ultimate treatment  - Encouraged to wear compression stockings/socks   - Recommended start a daily OTC anti-fungal, Lotrimin qAM and Urea 20% cream qPM, to the feet and to use a different nail clipper for the great toe than the unaffected toes to minimize spread to other nails.  - Counseled to spray OTC Tinactin to the shoes and feet daily   - Advised to clean showers with clorox spray     The patient was advised to call for an appointment should any new, changing, or symptomatic lesions develop.     RTC: Return in about 5 months (around 04/28/2023) for pyoderma gangrenosum f/u. or sooner as needed _________________________________________________________________      Chief Complaint     Chief Complaint   Patient presents with    Follow-up     Patient states he has seen some improvement with the pyoderma gangrenosum. He states the wound has healed, but not as thick and scabby as it was a few weeks ago.    Skin Problem     Patient thinks that he has athlete's foot and first noticed it two weeks ago. It began as a foot rash, which progressed. Patient has been using Lotrim for the past week and a half, patient states it was clearing up but has come back.       HPI     Andrew Cameron is a 42 y.o. male who presents as a returning patient (last seen by Dr. Alvina Filbert and an Dr. Odis Luster on 06/26/2022) to [facility] for follow up of PG. At last visit, patient was to continue infliximab 10 mg/kg q6wks and received an ILK-40 injection to L lower leg for pyoderma gangrenosum.    Today, patient reports his pyoderma gangrenosum has been present for 5 years ago. He describes to have a L leg wound that became an ulcer; pt confirms to have ulcerative colitis that he treated with mesalamine 8 years ago. Currently on Remicade infusions every 6 weeks for the past 2 years. Patient was previously on Humira for 6-8 months prior to Remicade. He found the Remicade to be more beneficial than the Humira. Denies boils appearing of  the axillae.     Additionally, patient r    Fine scale of bl feet and thickening of the toenails     The patient denies any other new or changing lesions or areas of concern.     Pertinent Past Medical History     Ulcerative colitis     Past Medical History, Family History, Social History, Medication List, Allergies, and Problem List were reviewed in the rooming section of Epic.     ROS: Other than symptoms mentioned in the HPI, no fevers, chills, or other skin complaints    Physical Examination     GENERAL: Well-appearing male in no acute distress, resting comfortably.  NEURO: Alert and oriented, answers questions appropriately  PSYCH: Normal mood and affect  SKIN: Examination of the bilateral lower extremities and feet was performed  - well healed scar on L leg   - scale to feet and toes with cracking    All areas not commented on are within normal limits or unremarkable    Scribe's Attestation: Abbie Sons, MD obtained and performed the history, physical exam and medical decision making elements that were entered into the chart.  Signed by Reece Leader, Scribe, on November 26, 2022 at 3:05 PM.    November 27, 2022 4:39 PM. Documentation assistance provided by the Scribe. I was present during the time the encounter was recorded. The information recorded by the Scribe was done at my direction and has been reviewed and validated by me.      (Approved Template 01/31/2020)

## 2022-11-26 ENCOUNTER — Ambulatory Visit: Admit: 2022-11-26 | Discharge: 2022-11-26 | Payer: PRIVATE HEALTH INSURANCE

## 2022-11-26 DIAGNOSIS — Z79899 Other long term (current) drug therapy: Principal | ICD-10-CM

## 2022-11-26 DIAGNOSIS — L88 Pyoderma gangrenosum: Principal | ICD-10-CM

## 2022-11-26 NOTE — Unmapped (Addendum)
For your feet,   - You can spray Tinactin to your shoes daily. The spray is available over the counter at any drug store.   - In the morning, continue applying Lotrimin or Lamisil cream  - In the evening, start applying Urea 20% cream. You can find this over the counter and [employer]  - You can clean your shower with clorox spray to prevent any fungal growth.         For your pyoderma gangrenosum   - Continue Remicade every 6 weeks  - Please get your labs done today!     Please have your labs drawn at any of the following facilities below:    [facility]   [facility] [place]  [phone]       [phone]  [address]     [address]  [place], [place] [address]      [facility] [place], [address]  7 am to 6 pm, Monday to Friday    [place], [place] [address]  8 am to 1 pm, Saturday & Sunday    7:30 am to 5 pm, Monday to Friday          Closed Saturday & Sunday    [facility] [place] [facility]   [facility] [place]  [phone]       [phone]  [address] [place] [address]     [address]  [facility] [place], [address]    [facility], [address]  [place], [place] [address]      [place], [place] [address]  7:30 am to 6 pm, Monday to Thursday   7 am to 5 pm. Monday to Friday  8 am to 5 pm, Friday      Closed Saturday & Sunday  Closed Saturday & Sunday      [facility] [place]   [facility] [place]  [phone]       [phone]  [address]     [address] [place] [address]  [address]       [address]  [place], [place] [address]      [place], [place] [address]  7:30 am to 5 pm, Monday to Friday    8 am to 4:30 pm, Monday to Friday  Closed Saturday & Sunday     Closed Saturday & Sunday      [facility] [place]    [facility] [place]  [phone]       [phone]  [address]     [address]  [employer], [address]     [address]  [place], [place] [address]      [place], [place] [address]  7:30 am to 5 pm, Monday to Friday    7:30 am to 4 pm, Monday to Thursday  Closed Saturday & Sunday     7:30 am to 2:30 pm, Friday          Closed Saturday & Sunday   [facility]  [phone]  [address]  [facility] [place], [address]  [place], [place] [address]  7:30 am to 5 pm, Monday to Friday  Closed Saturday & Sunday

## 2022-11-29 ENCOUNTER — Other Ambulatory Visit: Payer: Self-pay | Admitting: Primary Care

## 2022-11-29 DIAGNOSIS — E119 Type 2 diabetes mellitus without complications: Secondary | ICD-10-CM

## 2022-11-29 NOTE — Telephone Encounter (Signed)
Can we get him on the schedule for Monday next week?

## 2022-11-29 NOTE — Telephone Encounter (Signed)
Patient scheduled on 7/15.

## 2022-11-30 LAB — QUANTIFERON TB GOLD PLUS
QUANTIFERON ANTIGEN 1 MINUS NIL: 0 [IU]/mL
QUANTIFERON ANTIGEN 2 MINUS NIL: 0 [IU]/mL
QUANTIFERON MITOGEN: 9.98 [IU]/mL
QUANTIFERON TB GOLD PLUS: NEGATIVE
QUANTIFERON TB NIL VALUE: 0.02 [IU]/mL

## 2022-11-30 LAB — TB AG2: TB AG2 VALUE: 0.02

## 2022-11-30 LAB — TB NIL: TB NIL VALUE: 0.02

## 2022-11-30 LAB — TB AG1: TB AG1 VALUE: 0.02

## 2022-11-30 LAB — TB MITOGEN: TB MITOGEN VALUE: 10

## 2022-12-02 ENCOUNTER — Ambulatory Visit (INDEPENDENT_AMBULATORY_CARE_PROVIDER_SITE_OTHER): Payer: 59 | Admitting: Primary Care

## 2022-12-02 ENCOUNTER — Encounter: Payer: Self-pay | Admitting: Primary Care

## 2022-12-02 ENCOUNTER — Other Ambulatory Visit: Payer: Self-pay | Admitting: Primary Care

## 2022-12-02 VITALS — BP 110/80 | HR 88 | Temp 97.6°F | Ht 71.0 in | Wt 333.0 lb

## 2022-12-02 DIAGNOSIS — E1165 Type 2 diabetes mellitus with hyperglycemia: Secondary | ICD-10-CM

## 2022-12-02 DIAGNOSIS — Z794 Long term (current) use of insulin: Secondary | ICD-10-CM

## 2022-12-02 DIAGNOSIS — B353 Tinea pedis: Secondary | ICD-10-CM | POA: Diagnosis not present

## 2022-12-02 HISTORY — DX: Tinea pedis: B35.3

## 2022-12-02 LAB — COMPREHENSIVE METABOLIC PANEL
ALT: 35 U/L (ref 0–53)
AST: 23 U/L (ref 0–37)
Albumin: 4.2 g/dL (ref 3.5–5.2)
Alkaline Phosphatase: 113 U/L (ref 39–117)
BUN: 13 mg/dL (ref 6–23)
CO2: 26 mEq/L (ref 19–32)
Calcium: 9.3 mg/dL (ref 8.4–10.5)
Chloride: 101 mEq/L (ref 96–112)
Creatinine, Ser: 1.01 mg/dL (ref 0.40–1.50)
GFR: 90.86 mL/min (ref >60.00)
Glucose, Bld: 117 mg/dL — ABNORMAL HIGH (ref 70–99)
Potassium: 3.9 mEq/L (ref 3.5–5.1)
Sodium: 136 mEq/L (ref 135–145)
Total Bilirubin: 0.5 mg/dL (ref 0.2–1.2)
Total Protein: 7.4 g/dL (ref 6.0–8.3)

## 2022-12-02 MED ORDER — TIRZEPATIDE 7.5 MG/0.5ML ~~LOC~~ SOAJ: 7.5000 mg | SUBCUTANEOUS | 0 refills | Status: DC

## 2022-12-02 MED ORDER — TERBINAFINE HCL 250 MG PO TABS: 250.0000 mg | ORAL_TABLET | Freq: Every day | ORAL | 0 refills | Status: AC

## 2022-12-02 NOTE — Patient Instructions (Addendum)
Stop by the lab prior to leaving today. I will notify you of your results once received.   We increased the dose of your Mounjaro to 7.5 mg weekly for diabetes.  It was a pleasure to see you today!

## 2022-12-02 NOTE — Progress Notes (Signed)
Subjective:    Patient ID: [name], male    DOB: 04/18/1981, 42 y.o.   MRN: [id number]  Diabetes Pertinent negatives for hypoglycemia include no dizziness.    [name] is a very pleasant 42 y.o. male with a history of hypertension, ulcerative colitis, type 2 diabetes, chronic skin ulcer to left calf, venous stasis, tobacco abuse who presents today to discuss rash and diabetes.  His wife joins [place] today.  Symptom onset 3 to 4 weeks ago of red rash to bilateral dorsal feet and ankles. The rash initially became itchy with heat. He then noticed scaling to the skin of his feet with constant itching.   He had been using Lotrimin OTC without improvement. He was evaluated by dermatology last week who told him to alternate Lotrimin and Tinactin daily, and he's not noticed improvement yet.  He denies changes in medications, supplements, diet. He has been more active on his feet recently.    He is checking glucose levels which are ranging in the 90s to low 100s.  He is currently managed on Lantus 40 units daily, metformin 1000 mg twice daily, Mounjaro 5 mg weekly.  He used his last 5 mg pen today and would like to increase his dose to 7.5 mg weekly.  He denies episodes of hypoglycemia.  He has tolerated Mounjaro well.  Wt Readings from Last 3 Encounters:  12/02/22 (!) 333 lb (151 kg)  10/29/22 (!) 333 lb (151 kg)  09/25/22 (!) 333 lb (151 kg)      Review of Systems  Gastrointestinal:  Negative for abdominal pain and constipation.  Skin:  Positive for color change and rash. Negative for wound.  Neurological:  Negative for dizziness.         Past Medical History:  Diagnosis Date   Blood in stool    Depression    Elevated blood pressure    Hyperlipidemia    OSA (obstructive sleep apnea) 2014   Seasonal allergies    Ulcerative colitis (HCC) 03/05/2018    Social History   Socioeconomic History   Marital status: Married    Spouse name: Not on file   Number of children:  Not on file   Years of education: Not on file   Highest education level: Not on file  Occupational History   Not on file  Tobacco Use   Smoking status: Every Day    Current packs/day: 0.00    Average packs/day: 1 pack/day for 14.0 years (14.0 ttl pk-yrs)    Types: Cigarettes    Start date: 11/24/2004    Last attempt to quit: 11/25/2018    Years since quitting: 4.0   Smokeless tobacco: Former    Quit date: 03/17/2015  Vaping Use   Vaping status: Never Used  Substance and Sexual Activity   Alcohol use: Not Currently    Alcohol/week: 0.0 standard drinks of alcohol    Comment: soical   Drug use: Not on file   Sexual activity: Not on file  Other Topics Concern   Not on file  Social History Narrative   Married.   7 kids.   Works as a Magazine features editor at [employer].   Enjoys target shooting.    Social Determinants of Health   Financial Resource Strain: Not on file  Food Insecurity: Not on file  Transportation Needs: Not on file  Physical Activity: Not on file  Stress: Not on file  Social Connections: Not on file  Intimate Partner Violence: Not on file  Past Surgical History:  Procedure Laterality Date   SEPTOPLASTY  2007   WRIST SURGERY      Family History  Problem Relation Age of Onset   Multiple sclerosis Mother    Dementia Mother    Alcohol abuse Paternal Aunt    Alcohol abuse Paternal Uncle    Stroke Paternal Uncle    Hyperlipidemia Maternal Grandfather    Hypertension Maternal Grandfather    Diabetes Maternal Grandfather    Alcohol abuse Maternal Grandfather    Breast cancer Neg Hx     Allergies  Allergen Reactions   Biaxin [Clarithromycin] Other (See Comments)    Causes colitis flares   Milk-Related Compounds Hives    Current Outpatient Medications on File Prior to Visit  Medication Sig Dispense Refill   acetaminophen (TYLENOL) 500 MG tablet Take 1,000 mg by mouth every 6 (six) hours as needed for headache (pain).     atorvastatin (LIPITOR) 40 MG tablet  TAKE 1 TABLET(40 MG) BY MOUTH DAILY FOR CHOLESTEROL 90 tablet 3   blood glucose meter kit and supplies KIT Dispense based on patient and insurance preference. Use up to four times daily as directed. (FOR ICD-9 250.00, 250.01). 1 each 0   buPROPion ER (WELLBUTRIN SR) 100 MG 12 hr tablet Take 1 tablet (100 mg total) by mouth 2 (two) times daily. 180 tablet 0   clobetasol ointment (TEMOVATE) 0.05 % Apply 1 application  topically 2 (two) times daily.     Continuous Blood Gluc Receiver (DEXCOM G6 RECEIVER) DEVI 1 Device by Does not apply route continuous. 1 each 0   Continuous Glucose Sensor (DEXCOM G6 SENSOR) MISC Used to check blood sugars continuously.  Change every 10 days. 3 each 5   Continuous Glucose Transmitter (DEXCOM G6 TRANSMITTER) MISC Use continuously to check blood sugars. 1 each 1   cyclobenzaprine (FLEXERIL) 5 MG tablet Take 1-2 tablets (5-10 mg total) by mouth 3 (three) times daily as needed for muscle spasms. 30 tablet 0   hydrocortisone 2.5 % ointment Apply topically.     ibuprofen (ADVIL,MOTRIN) 200 MG tablet Take 400-600 mg by mouth every 6 (six) hours as needed for headache (pain).     inFLIXimab (REMICADE) 100 MG injection Inject into the muscle once a week.     Insulin Pen Needle (B-D UF III MINI PEN NEEDLES) 31G X 5 MM MISC USE 1 EVERY NIGHT AT BEDTIME WITH INSULIN AS DIRECTED. 300 each 0   ketoconazole (NIZORAL) 2 % cream Apply topically 2 (two) times daily.     LANTUS SOLOSTAR 100 UNIT/ML Solostar Pen Inject 40 Units into the skin daily. 45 mL 0   metFORMIN (GLUCOPHAGE) 1000 MG tablet TAKE 1 TABLET BY MOUTH TWICE DAILY WITH MEALS FOR DIABETES 180 tablet 1   triamcinolone ointment (KENALOG) 0.1 % Apply topically.     venlafaxine XR (EFFEXOR-XR) 150 MG 24 hr capsule TAKE 1 CAPSULE(150 MG) BY MOUTH DAILY WITH BREAKFAST FOR ANXIETY OR DEPRESSION 90 capsule 0   No current facility-administered medications on file prior to visit.    BP 110/80   Pulse 88   Temp 97.6 F (36.4 C)  (Temporal)   Ht 5\' 11"  (1.803 m)   Wt (!) 333 lb (151 kg)   SpO2 98%   BMI 46.44 kg/m  Objective:   Physical Exam Constitutional:      General: He is not in acute distress. Pulmonary:     Effort: Pulmonary effort is normal.  Skin:    General: Skin is warm  and dry.     Findings: Rash present.     Comments: Scaly rash to bilateral dorsal feet with significant scaling in between toes.  Mild scaling to plantar feet.  Mild erythema to bilateral dorsal feet.           Assessment & Plan:  Tinea pedis of both feet Assessment & Plan: Moderate to severe.  Failed to topical treatments OTC.  Given the severity, would recommend oral treatment at this point.  History of mild transaminitis so we will repeat CMP today. If enzymes are stable then we can proceed with a 2-week course of terbinafine to 250 mg daily.  If enzymes are not stable then we will proceed with prescription topical treatment.  Await results.   Orders: -     Comprehensive metabolic panel  Type 2 diabetes mellitus with hyperglycemia, with long-term current use of insulin (HCC) Assessment & Plan: Improving from glucose readings reported today.  Increase Mounjaro to 7.5 mg weekly. Continue Lantus 40 units daily for now. Continue metformin 1000 mg twice daily for now.  Anticipate dose reduction of Lantus. He will update if he begins to notice hypoglycemic episodes.  Follow-up as scheduled.  Orders: -     Tirzepatide; Inject 7.5 mg into the skin once a week. for diabetes.  Dispense: 6 mL; Refill: 0        [name], NP

## 2022-12-02 NOTE — Assessment & Plan Note (Addendum)
Moderate to severe.  Failed to topical treatments OTC.  Given the severity, would recommend oral treatment at this point.  History of mild transaminitis so we will repeat CMP today. If enzymes are stable then we can proceed with a 2-week course of terbinafine to 250 mg daily.  If enzymes are not stable then we will proceed with prescription topical treatment.  Await results.

## 2022-12-02 NOTE — Assessment & Plan Note (Signed)
Improving from glucose readings reported today.  Increase Mounjaro to 7.5 mg weekly. Continue Lantus 40 units daily for now. Continue metformin 1000 mg twice daily for now.  Anticipate dose reduction of Lantus. He will update if he begins to notice hypoglycemic episodes.  Follow-up as scheduled.

## 2022-12-11 ENCOUNTER — Other Ambulatory Visit: Payer: Self-pay | Admitting: Primary Care

## 2022-12-11 DIAGNOSIS — F3342 Major depressive disorder, recurrent, in full remission: Secondary | ICD-10-CM

## 2022-12-11 DIAGNOSIS — E785 Hyperlipidemia, unspecified: Secondary | ICD-10-CM

## 2022-12-18 ENCOUNTER — Encounter (INDEPENDENT_AMBULATORY_CARE_PROVIDER_SITE_OTHER): Payer: Self-pay

## 2022-12-18 ENCOUNTER — Other Ambulatory Visit: Payer: Self-pay | Admitting: Primary Care

## 2022-12-18 DIAGNOSIS — F3342 Major depressive disorder, recurrent, in full remission: Secondary | ICD-10-CM

## 2022-12-18 DIAGNOSIS — E785 Hyperlipidemia, unspecified: Secondary | ICD-10-CM

## 2022-12-20 DIAGNOSIS — B353 Tinea pedis: Secondary | ICD-10-CM

## 2022-12-23 MED ORDER — TERBINAFINE HCL 250 MG PO TABS: 250.0000 mg | ORAL_TABLET | Freq: Every day | ORAL | 0 refills | Status: DC

## 2022-12-24 ENCOUNTER — Telehealth: Payer: 59 | Admitting: Primary Care

## 2022-12-25 ENCOUNTER — Other Ambulatory Visit: Payer: Self-pay | Admitting: Primary Care

## 2022-12-25 DIAGNOSIS — Z720 Tobacco use: Secondary | ICD-10-CM

## 2022-12-25 NOTE — Unmapped (Signed)
[facility] lab results imported to media tab for provider review.

## 2022-12-25 NOTE — Unmapped (Signed)
[facility] Update received 12/24/2022 - imported to the patient's chart under the media tab and routed to the provider.

## 2022-12-31 ENCOUNTER — Encounter: Payer: Self-pay | Admitting: Primary Care

## 2022-12-31 ENCOUNTER — Ambulatory Visit (INDEPENDENT_AMBULATORY_CARE_PROVIDER_SITE_OTHER): Payer: 59 | Admitting: Primary Care

## 2022-12-31 VITALS — BP 132/68 | HR 86 | Temp 97.9°F | Ht 69.0 in | Wt 330.0 lb

## 2022-12-31 DIAGNOSIS — Z794 Long term (current) use of insulin: Secondary | ICD-10-CM

## 2022-12-31 DIAGNOSIS — Z720 Tobacco use: Secondary | ICD-10-CM

## 2022-12-31 DIAGNOSIS — F3342 Major depressive disorder, recurrent, in full remission: Secondary | ICD-10-CM

## 2022-12-31 DIAGNOSIS — E1165 Type 2 diabetes mellitus with hyperglycemia: Secondary | ICD-10-CM

## 2022-12-31 DIAGNOSIS — Z0000 Encounter for general adult medical examination without abnormal findings: Secondary | ICD-10-CM | POA: Diagnosis not present

## 2022-12-31 DIAGNOSIS — B353 Tinea pedis: Secondary | ICD-10-CM

## 2022-12-31 DIAGNOSIS — K5190 Ulcerative colitis, unspecified, without complications: Secondary | ICD-10-CM

## 2022-12-31 DIAGNOSIS — I10 Essential (primary) hypertension: Secondary | ICD-10-CM | POA: Diagnosis not present

## 2022-12-31 DIAGNOSIS — E785 Hyperlipidemia, unspecified: Secondary | ICD-10-CM

## 2022-12-31 DIAGNOSIS — Z23 Encounter for immunization: Secondary | ICD-10-CM | POA: Diagnosis not present

## 2022-12-31 LAB — HEMOGLOBIN A1C: Hgb A1c MFr Bld: 7.6 % — ABNORMAL HIGH (ref 4.6–6.5)

## 2022-12-31 LAB — COMPREHENSIVE METABOLIC PANEL
ALT: 37 U/L (ref 0–53)
AST: 27 U/L (ref 0–37)
Albumin: 4.2 g/dL (ref 3.5–5.2)
Alkaline Phosphatase: 108 U/L (ref 39–117)
BUN: 12 mg/dL (ref 6–23)
CO2: 27 mEq/L (ref 19–32)
Calcium: 9 mg/dL (ref 8.4–10.5)
Chloride: 100 mEq/L (ref 96–112)
Creatinine, Ser: 1 mg/dL (ref 0.40–1.50)
GFR: 91.9 mL/min (ref >60.00)
Glucose, Bld: 117 mg/dL — ABNORMAL HIGH (ref 70–99)
Potassium: 3.6 mEq/L (ref 3.5–5.1)
Sodium: 135 mEq/L (ref 135–145)
Total Bilirubin: 0.4 mg/dL (ref 0.2–1.2)
Total Protein: 7.4 g/dL (ref 6.0–8.3)

## 2022-12-31 LAB — LIPID PANEL
Cholesterol: 136 mg/dL (ref 0–200)
HDL: 52.9 mg/dL (ref >39.00)
LDL Cholesterol: 54 mg/dL (ref 0–99)
NonHDL: 82.97
Total CHOL/HDL Ratio: 3
Triglycerides: 146 mg/dL (ref 0.0–149.0)
VLDL: 29.2 mg/dL (ref 0.0–40.0)

## 2022-12-31 LAB — MICROALBUMIN / CREATININE URINE RATIO
Creatinine,U: 154.6 mg/dL
Microalb Creat Ratio: 0.5 mg/g (ref 0.0–30.0)
Microalb, Ur: <0.7 mg/dL (ref 0.0–1.9)

## 2022-12-31 MED ORDER — TIRZEPATIDE 10 MG/0.5ML ~~LOC~~ SOAJ: 10.0000 mg | SUBCUTANEOUS | 0 refills | Status: DC

## 2022-12-31 NOTE — Progress Notes (Signed)
Subjective:    Patient ID: [name], male    DOB: 11/25/1980, 42 y.o.   MRN: [id number]  HPI  Valentin Marke is a very pleasant 42 y.o. male who presents today for complete physical and follow up of chronic conditions.  Immunizations: -Tetanus: Completed in 2020 -Pneumonia: Never completed  Diet: Fair diet.  Exercise: No regular exercise.  Eye exam: Completes annually Dental exam: Completes semi-annually    BP Readings from Last 3 Encounters:  12/31/22 132/68  12/02/22 110/80  10/29/22 132/76    Wt Readings from Last 3 Encounters:  12/31/22 (!) 330 lb (149.7 kg)  12/02/22 (!) 333 lb (151 kg)  10/29/22 (!) 333 lb (151 kg)   BP Readings from Last 3 Encounters:  12/31/22 132/68  12/02/22 110/80  10/29/22 132/76        Review of Systems  Constitutional:  Negative for unexpected weight change.  HENT:  Negative for rhinorrhea.   Respiratory:  Negative for cough and shortness of breath.   Cardiovascular:  Negative for chest pain.  Gastrointestinal:  Positive for constipation. Negative for diarrhea.  Genitourinary:  Negative for difficulty urinating.  Musculoskeletal:  Negative for arthralgias and myalgias.  Skin:  Positive for rash.  Allergic/Immunologic: Negative for environmental allergies.  Neurological:  Negative for dizziness, numbness and headaches.  Psychiatric/Behavioral:  The patient is not nervous/anxious.          Past Medical History:  Diagnosis Date   Acute breast pain 11/22/2021   Acute foot pain, right 07/31/2021   Blood in stool    Depression    Elevated blood pressure    Hyperlipidemia    OSA (obstructive sleep apnea) 2014   Seasonal allergies    Ulcerative colitis (HCC) 03/05/2018   Wound infection 07/25/2020    Social History   Socioeconomic History   Marital status: Married    Spouse name: Not on file   Number of children: Not on file   Years of education: Not on file   Highest education level: Not on file  Occupational  History   Not on file  Tobacco Use   Smoking status: Every Day    Current packs/day: 0.00    Average packs/day: 1 pack/day for 14.0 years (14.0 ttl pk-yrs)    Types: Cigarettes    Start date: 11/24/2004    Last attempt to quit: 11/25/2018    Years since quitting: 4.1   Smokeless tobacco: Former    Quit date: 03/17/2015  Vaping Use   Vaping status: Never Used  Substance and Sexual Activity   Alcohol use: Not Currently    Alcohol/week: 0.0 standard drinks of alcohol    Comment: soical   Drug use: Never   Sexual activity: Not on file  Other Topics Concern   Not on file  Social History Narrative   Married.   7 kids.   Works as a Magazine features editor at [employer].   Enjoys target shooting.    Social Determinants of Health   Financial Resource Strain: Not on file  Food Insecurity: Not on file  Transportation Needs: Not on file  Physical Activity: Not on file  Stress: Not on file  Social Connections: Not on file  Intimate Partner Violence: Not on file    Past Surgical History:  Procedure Laterality Date   SEPTOPLASTY  2007   WRIST SURGERY      Family History  Problem Relation Age of Onset   Multiple sclerosis Mother    Dementia Mother  Alcohol abuse Paternal Aunt    Alcohol abuse Paternal Uncle    Stroke Paternal Uncle    Hyperlipidemia Maternal Grandfather    Hypertension Maternal Grandfather    Diabetes Maternal Grandfather    Alcohol abuse Maternal Grandfather    Breast cancer Neg Hx     Allergies  Allergen Reactions   Biaxin [Clarithromycin] Other (See Comments)    Causes colitis flares   Milk-Related Compounds Hives    Current Outpatient Medications on File Prior to Visit  Medication Sig Dispense Refill   acetaminophen (TYLENOL) 500 MG tablet Take 1,000 mg by mouth every 6 (six) hours as needed for headache (pain).     atorvastatin (LIPITOR) 40 MG tablet TAKE 1 TABLET(40 MG) BY MOUTH DAILY FOR CHOLESTEROL 90 tablet 0   blood glucose meter kit and supplies KIT  Dispense based on patient and insurance preference. Use up to four times daily as directed. (FOR ICD-9 250.00, 250.01). 1 each 0   buPROPion ER (WELLBUTRIN SR) 100 MG 12 hr tablet Take 1 tablet (100 mg total) by mouth 2 (two) times daily. For depression 180 tablet 0   clobetasol ointment (TEMOVATE) 0.05 % Apply 1 application  topically 2 (two) times daily.     Continuous Blood Gluc Receiver (DEXCOM G6 RECEIVER) DEVI 1 Device by Does not apply route continuous. 1 each 0   Continuous Glucose Sensor (DEXCOM G6 SENSOR) MISC Used to check blood sugars continuously.  Change every 10 days. 3 each 5   Continuous Glucose Transmitter (DEXCOM G6 TRANSMITTER) MISC Use continuously to check blood sugars. 1 each 1   cyclobenzaprine (FLEXERIL) 5 MG tablet Take 1-2 tablets (5-10 mg total) by mouth 3 (three) times daily as needed for muscle spasms. 30 tablet 0   hydrocortisone 2.5 % ointment Apply topically.     ibuprofen (ADVIL,MOTRIN) 200 MG tablet Take 400-600 mg by mouth every 6 (six) hours as needed for headache (pain).     inFLIXimab (REMICADE) 100 MG injection Inject into the muscle once a week.     Insulin Pen Needle (B-D UF III MINI PEN NEEDLES) 31G X 5 MM MISC USE 1 EVERY NIGHT AT BEDTIME WITH INSULIN AS DIRECTED. 300 each 0   ketoconazole (NIZORAL) 2 % cream Apply topically 2 (two) times daily.     LANTUS SOLOSTAR 100 UNIT/ML Solostar Pen Inject 40 Units into the skin daily. 45 mL 0   metFORMIN (GLUCOPHAGE) 1000 MG tablet TAKE 1 TABLET BY MOUTH TWICE DAILY WITH MEALS FOR DIABETES 180 tablet 1   terbinafine (LAMISIL) 250 MG tablet Take 1 tablet (250 mg total) by mouth daily. For athletes foot 14 tablet 0   triamcinolone ointment (KENALOG) 0.1 % Apply topically.     venlafaxine XR (EFFEXOR-XR) 150 MG 24 hr capsule TAKE 1 CAPSULE(150 MG) BY MOUTH DAILY WITH BREAKFAST FOR ANXIETY OR DEPRESSION 90 capsule 0   No current facility-administered medications on file prior to visit.    BP 132/68   Pulse 86    Temp 97.9 F (36.6 C) (Temporal)   Ht 5\' 9"  (1.753 m)   Wt (!) 330 lb (149.7 kg)   SpO2 99%   BMI 48.73 kg/m  Objective:   Physical Exam HENT:     Right Ear: Tympanic membrane and ear canal normal.     Left Ear: Tympanic membrane and ear canal normal.     Nose: Nose normal.     Right Sinus: No maxillary sinus tenderness or frontal sinus tenderness.  Left Sinus: No maxillary sinus tenderness or frontal sinus tenderness.  Eyes:     Conjunctiva/sclera: Conjunctivae normal.  Neck:     Thyroid: No thyromegaly.     Vascular: No carotid bruit.  Cardiovascular:     Rate and Rhythm: Normal rate and regular rhythm.     Heart sounds: Normal heart sounds.  Pulmonary:     Effort: Pulmonary effort is normal.     Breath sounds: Normal breath sounds. No wheezing or rales.  Abdominal:     General: Bowel sounds are normal.     Palpations: Abdomen is soft.     Tenderness: There is no abdominal tenderness.  Musculoskeletal:        General: Normal range of motion.     Cervical back: Neck supple.  Skin:    General: Skin is warm and dry.     Findings: Rash present.     Comments: Dry, flaking skin to bilateral dorsal feet   Neurological:     Mental Status: He is alert and oriented to person, place, and time.     Cranial Nerves: No cranial nerve deficit.     Deep Tendon Reflexes: Reflexes are normal and symmetric.  Psychiatric:        Mood and Affect: Mood normal.           Assessment & Plan:  Preventative health care Assessment & Plan: Prevnar 20 vaccine provided today. Colonoscopy UTD.   Discussed the importance of a healthy diet and regular exercise in order for weight loss, and to reduce the risk of further co-morbidity.  Exam stable. Labs pending.  Follow up in 1 year for repeat physical.    Type 2 diabetes mellitus with hyperglycemia, with long-term current use of insulin (HCC) Assessment & Plan: Repeat A1c pending.  For weight loss purposes we both agree to  increase Mounjaro to 10 mg weekly.  Discussed potential side effects.  Continue Lantus 40 units daily, metformin 1000 mg twice daily. Goal is to wean off of insulin if possible.  Urine microalbumin due and pending.  Follow-up in 3 to 6 months based on A1c result.  Orders: -     Hemoglobin A1c -     Microalbumin / creatinine urine ratio -     Tirzepatide; Inject 10 mg into the skin once a week. for diabetes.  Dispense: 6 mL; Refill: 0  Essential hypertension Assessment & Plan: Controlled.  Continue to monitor.  Orders: -     Comprehensive metabolic panel  Hyperlipidemia, unspecified hyperlipidemia type Assessment & Plan: Repeat lipid panel pending.  Continue atorvastatin 40 mg daily.  Orders: -     Lipid panel  Ulcerative colitis without complications, unspecified location [facility]) Assessment & Plan: Stable. Colonoscopy up-to-date.   Immunization due -     Pneumococcal conjugate vaccine 20-valent  Tinea pedis of both feet Assessment & Plan: Improving.  Repeat liver enzymes pending today.  Continue terbinafine to 250 mg daily.  Will likely need an additional 1 to 2 months of terbinafine based on exam today.   Recurrent major depressive disorder, in full remission [facility]) Assessment & Plan: Controlled.  Continue venlafaxine ER 150 mg daily. Continue bupropion at 100 mg twice daily which is mostly prescribed for smoking cessation.   Tobacco abuse Assessment & Plan: Cutting back, commended him on this.  Continue bupropion 100 mg twice daily.         [name], NP

## 2022-12-31 NOTE — Patient Instructions (Signed)
Stop by the lab prior to leaving today. I will notify you of your results once received.   Continue taking terbinafine daily for foot fungus.  Continue working on cutting back on smoking.  Please schedule a follow up visit for 3 months.  It was a pleasure to see you today!

## 2022-12-31 NOTE — Assessment & Plan Note (Signed)
Controlled.  Continue venlafaxine ER 150 mg daily. Continue bupropion at 100 mg twice daily which is mostly prescribed for smoking cessation.

## 2022-12-31 NOTE — Assessment & Plan Note (Signed)
Controlled.  Continue to monitor.  

## 2022-12-31 NOTE — Assessment & Plan Note (Signed)
Improving.  Repeat liver enzymes pending today.  Continue terbinafine to 250 mg daily.  Will likely need an additional 1 to 2 months of terbinafine based on exam today.

## 2022-12-31 NOTE — Assessment & Plan Note (Signed)
Following with [facility] dermatology.  Continue Remicade 100 mg infusions.

## 2022-12-31 NOTE — Assessment & Plan Note (Signed)
Repeat A1c pending.  For weight loss purposes we both agree to increase Mounjaro to 10 mg weekly.  Discussed potential side effects.  Continue Lantus 40 units daily, metformin 1000 mg twice daily. Goal is to wean off of insulin if possible.  Urine microalbumin due and pending.  Follow-up in 3 to 6 months based on A1c result.

## 2022-12-31 NOTE — Assessment & Plan Note (Signed)
Stable. Colonoscopy up-to-date.

## 2022-12-31 NOTE — Assessment & Plan Note (Signed)
Cutting back, commended him on this.  Continue bupropion 100 mg twice daily.

## 2022-12-31 NOTE — Assessment & Plan Note (Signed)
Prevnar 20 vaccine provided today. Colonoscopy UTD.   Discussed the importance of a healthy diet and regular exercise in order for weight loss, and to reduce the risk of further co-morbidity.  Exam stable. Labs pending.  Follow up in 1 year for repeat physical.

## 2022-12-31 NOTE — Assessment & Plan Note (Signed)
Repeat lipid panel pending. Continue atorvastatin 40 mg daily.

## 2023-01-09 DIAGNOSIS — B353 Tinea pedis: Secondary | ICD-10-CM

## 2023-01-10 MED ORDER — TERBINAFINE HCL 250 MG PO TABS: 250.0000 mg | ORAL_TABLET | Freq: Every day | ORAL | 0 refills | Status: DC

## 2023-01-21 ENCOUNTER — Other Ambulatory Visit: Payer: Self-pay

## 2023-01-21 DIAGNOSIS — E1165 Type 2 diabetes mellitus with hyperglycemia: Secondary | ICD-10-CM

## 2023-01-21 MED ORDER — LANTUS SOLOSTAR 100 UNIT/ML ~~LOC~~ SOPN
40.0000 [IU] | PEN_INJECTOR | Freq: Every day | SUBCUTANEOUS | 1 refills | Status: DC
Start: 2023-01-21 — End: 2023-06-10

## 2023-01-29 DIAGNOSIS — B353 Tinea pedis: Secondary | ICD-10-CM

## 2023-01-30 MED ORDER — TERBINAFINE HCL 250 MG PO TABS: 250.0000 mg | ORAL_TABLET | Freq: Every day | ORAL | 0 refills | Status: AC

## 2023-02-05 NOTE — Unmapped (Signed)
[facility] infusion note

## 2023-03-17 ENCOUNTER — Other Ambulatory Visit: Payer: Self-pay | Admitting: Primary Care

## 2023-03-17 DIAGNOSIS — F3342 Major depressive disorder, recurrent, in full remission: Secondary | ICD-10-CM

## 2023-03-24 ENCOUNTER — Other Ambulatory Visit: Payer: Self-pay

## 2023-03-24 DIAGNOSIS — E785 Hyperlipidemia, unspecified: Secondary | ICD-10-CM

## 2023-03-24 DIAGNOSIS — Z720 Tobacco use: Secondary | ICD-10-CM

## 2023-03-24 MED ORDER — ATORVASTATIN CALCIUM 40 MG PO TABS
40.0000 mg | ORAL_TABLET | Freq: Every day | ORAL | 2 refills | Status: DC
Start: 2023-03-24 — End: 2023-12-24

## 2023-03-24 MED ORDER — BUPROPION HCL ER (SR) 100 MG PO TB12
100.0000 mg | ORAL_TABLET | Freq: Two times a day (BID) | ORAL | 2 refills | Status: DC
Start: 2023-03-24 — End: 2023-12-15

## 2023-03-25 ENCOUNTER — Encounter: Payer: 59 | Attending: Physician Assistant | Admitting: Physician Assistant

## 2023-03-25 DIAGNOSIS — L88 Pyoderma gangrenosum: Secondary | ICD-10-CM | POA: Insufficient documentation

## 2023-03-25 DIAGNOSIS — E11622 Type 2 diabetes mellitus with other skin ulcer: Secondary | ICD-10-CM | POA: Insufficient documentation

## 2023-03-25 DIAGNOSIS — L97222 Non-pressure chronic ulcer of left calf with fat layer exposed: Secondary | ICD-10-CM | POA: Insufficient documentation

## 2023-03-25 DIAGNOSIS — F17200 Nicotine dependence, unspecified, uncomplicated: Secondary | ICD-10-CM | POA: Insufficient documentation

## 2023-03-25 DIAGNOSIS — I87332 Chronic venous hypertension (idiopathic) with ulcer and inflammation of left lower extremity: Secondary | ICD-10-CM | POA: Diagnosis present

## 2023-03-26 NOTE — Progress Notes (Signed)
ETHRIDGE, SOLLENBERGER ([id number]) [id number] Page 1 of 5 Visit Report for 03/25/2023 Abuse Risk Screen Details Patient Name: Date of Service: [name], [name] 03/25/2023 8:15 A M Medical Record Number: [id number] Patient Account Number: [id number] Date of Birth/Sex: Treating RN: 07-Feb-1981 (42 y.o. [name] Primary Care Kyair Ditommaso: [name] Other Clinician: Referring Synethia Endicott: Treating Kathleen Tamm/Extender: Robbie Louis in Treatment: 0 Abuse Risk Screen Items Answer ABUSE RISK SCREEN: Has anyone close to you tried to hurt or harm you recentlyo No Do you feel uncomfortable with anyone in your familyo No Has anyone forced you do things that you didnt want to doo No Electronic Signature(s) Signed: 03/25/2023 4:26:12 PM By: Angelina Pih Entered By: Angelina Pih on 03/25/2023 08:21:30 -------------------------------------------------------------------------------- Activities of Daily Living Details Patient Name: Date of Service: [facility], [name] 03/25/2023 8:15 A M Medical Record Number: [id number] Patient Account Number: [id number] Date of Birth/Sex: Treating RN: Feb 08, 1981 (42 y.o. [name] Primary Care Riki Gehring: [name] Other Clinician: Referring Janelys Glassner: Treating [name]-Lee Villegas/Extender: Robbie Louis in Treatment: 0 Activities of Daily Living Items Answer Activities of Daily Living (Please select one for each item) Drive Automobile Completely Able T Medications ake Completely Able Use T elephone Completely Able Care for Appearance Completely Able Use T oilet Completely Able Bath / Shower Completely Able Dress Self Completely Able Feed Self Completely Able Walk Completely Able Get In / Out Bed Completely Able Housework Completely JOELL, USMAN ([id number]) [id number] Page 2 of 5 Prepare Meals Completely Able Handle Money Completely  Able Shop for Self Completely Able Electronic Signature(s) Signed: 03/25/2023 4:26:12 PM By: Angelina Pih Entered By: Angelina Pih on 03/25/2023 08:21:48 -------------------------------------------------------------------------------- Education Screening Details Patient Name: Date of Service: [name], [name] 03/25/2023 8:15 A M Medical Record Number: [id number] Patient Account Number: [id number] Date of Birth/Sex: Treating RN: 1981-02-05 (42 y.o. [name] Primary Care Joury Allcorn: [name] Other Clinician: Referring Ruey Storer: Treating Breanna Mcdaniel/Extender: Robbie Louis in Treatment: 0 Primary Learner Assessed: Patient Learning Preferences/Education Level/Primary Language Learning Preference: Explanation, Demonstration, Video, Communication Board, Printed Material Preferred Language: English Cognitive Barrier Language Barrier: No Translator Needed: No Memory Deficit: No Emotional Barrier: No Cultural/Religious Beliefs Affecting Medical Care: No Physical Barrier Impaired Vision: Yes Glasses Impaired Hearing: No Decreased Hand dexterity: No Knowledge/Comprehension Knowledge Level: High Comprehension Level: High Ability to understand written instructions: High Ability to understand verbal instructions: High Motivation Anxiety Level: Calm Cooperation: Cooperative Education Importance: Acknowledges Need Interest in Health Problems: Asks Questions Perception: Coherent Willingness to Engage in Self-Management High Activities: Readiness to Engage in Self-Management High Activities: Electronic Signature(s) Signed: 03/25/2023 8:37:27 AM By: Angelina Pih Entered By: Angelina Pih on 03/25/2023 08:37:27 [name], Molly [name] ([id number]) [id number] Page 3 of 5 -------------------------------------------------------------------------------- Fall Risk Assessment Details Patient Name: Date of Service: [name], [place]  BERT 03/25/2023 8:15 A M Medical Record Number: [id number] Patient Account Number: [id number] Date of Birth/Sex: Treating RN: [birth year] (42 y.o. [name] Primary Care [name]: [name] Other Clinician: Referring Avyon Herendeen: Treating [name]/Extender: Robbie Louis in Treatment: 0 Fall Risk Assessment Items Have you had 2 or more falls in the last 12 monthso 0 No Have you had any fall that resulted in injury in the last 12 monthso 0 No FALLS RISK SCREEN History of falling - immediate or within 3 months 0 No Secondary diagnosis (Do you have 2 or more medical diagnoseso) 0 No Ambulatory aid None/bed rest/wheelchair/nurse 0 Yes Crutches/cane/walker 0 No  Furniture 0 No Intravenous therapy Access/Saline/Heparin Lock 0 No Gait/Transferring Normal/ bed rest/ wheelchair 0 Yes Weak (short steps with or without shuffle, stooped but able to lift head while walking, may seek 0 No support from furniture) Impaired (short steps with shuffle, may have difficulty arising from chair, head down, impaired 0 No balance) Mental Status Oriented to own ability 0 Yes Electronic Signature(s) Signed: 03/25/2023 4:26:12 PM By: Angelina Pih Entered By: Angelina Pih on 03/25/2023 08:22:17 -------------------------------------------------------------------------------- Foot Assessment Details Patient Name: Date of Service: [name], [name] 03/25/2023 8:15 A M Medical Record Number: [id number] Patient Account Number: [id number] Date of Birth/Sex: Treating RN: 18-Dec-1980 (42 y.o. [name] Primary Care Brnadon Eoff: [name] Other Clinician: Referring Khloie Hamada: Treating Yeilyn Gent/Extender: Robbie Louis in Treatment: 0 Foot Assessment Items Site Locations [place], [place] ([id number]) [id number] Page 4 of 5 + = Sensation present, - = Sensation absent, C = Callus, U = Ulcer R = Redness, W = Warmth, M =  Maceration, PU = Pre-ulcerative lesion F = Fissure, S = Swelling, D = Dryness Assessment Right: Left: Other Deformity: No No Prior Foot Ulcer: No No Prior Amputation: No No Charcot Joint: No No Ambulatory Status: Ambulatory Without Help Gait: Steady Electronic Signature(s) Signed: 03/25/2023 8:38:27 AM By: Angelina Pih Entered By: Angelina Pih on 03/25/2023 08:38:27 -------------------------------------------------------------------------------- Nutrition Risk Screening Details Patient Name: Date of Service: [name], [name] 03/25/2023 8:15 A M Medical Record Number: [id number] Patient Account Number: [id number] Date of Birth/Sex: Treating RN: [birth year] (42 y.o. [name] Primary Care Ashayla Subia: [name] Other Clinician: Referring Riata Ikeda: Treating Norene Oliveri/Extender: Robbie Louis in Treatment: 0 Height (in): 70 Weight (lbs): 329 Body Mass Index (BMI): 47.2 Nutrition Risk Screening Items Score Screening NUTRITION RISK SCREEN: I have an illness or condition that made me change the kind and/or amount of food I eat 0 No I eat fewer than two meals per day 0 No I eat few fruits and vegetables, or milk products 0 No I have three or more drinks of beer, liquor or wine almost every day 0 No I have tooth or mouth problems that make it hard for me to eat 0 No I don't always have enough money to buy the food I need 0 No Galea, Kyle ([id number]) [phone] [id number] Page 5 of 5 I eat alone most of the time 0 No I take three or more different prescribed or over-the-counter drugs a day 0 No Without wanting to, I have lost or gained 10 pounds in the last six months 0 No I am not always physically able to shop, cook and/or feed myself 0 No Nutrition Protocols Good Risk Protocol 0 No interventions needed Moderate Risk Protocol High Risk Proctocol Risk Level: Good Risk Score: 0 Electronic Signature(s) Signed: 03/25/2023  4:26:12 PM By: Angelina Pih Entered By: Angelina Pih on 03/25/2023 08:22:02

## 2023-03-26 NOTE — Progress Notes (Signed)
[name], [name] ([id number]) [id number] Page 1 of 10 Visit Report for 03/25/2023 Chief Complaint Document Details Patient Name: Date of Service: [name], [name] 03/25/2023 8:15 A M Medical Record Number: [id number] Patient Account Number: [id number] Date of Birth/Sex: Treating RN: 24-Jun-1980 (42 y.o. [name] Primary Care Provider: [name] Other Clinician: Referring Provider: Treating Provider/Extender: Robbie Louis in Treatment: 0 Information Obtained from: Patient Chief Complaint LLE pyoderma ulcer Electronic Signature(s) Signed: 03/25/2023 8:39:44 AM By: Allen Derry PA-C Entered By: Allen Derry on 03/25/2023 08:39:44 -------------------------------------------------------------------------------- HPI Details Patient Name: Date of Service: [name], [name] 03/25/2023 8:15 A M Medical Record Number: [id number] Patient Account Number: [id number] Date of Birth/Sex: Treating RN: 1980/06/03 (42 y.o. [name] Primary Care Provider: [name] Other Clinician: Referring Provider: Treating Provider/Extender: Robbie Louis in Treatment: 0 History of Present Illness HPI Description: 12/04/16; 42 year old man who comes into the clinic today for review of a wound on the posterior left calf. He tells me that is been there for about a year. He is not a diabetic he does smoke half a pack per day. He was seen in the ER on 11/20/16 felt to have cellulitis around the wound and was given clindamycin. An x-ray did not show osteomyelitis. The patient initially tells me that he has a milk allergy that sets off a pruritic itching rash on his lower legs which she scratches incessantly and he thinks that's what may have set up the wound. He has been using various topical antibiotics and ointments without any effect. He works in a [employer]ing [employer] and is on his feet all day. He does not have a prior history of wounds  however he does have the rash on both lower legs the right arm and the ventral aspect of his left arm. These are excoriations and clearly have had scratching however there are of macular looking areas on both legs including a substantial larger area on the right leg. This does not have an underlying open area. There is no blistering. 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 He seemed to recognize this. On further questioning he admits to a 5 year history of quiesced. ulcerative colitis. He is not in any treatment for this. He's had no recent travel 04/02/17; the patient was seen last week by Dr. Carles Collet at [facility] dermatology locally who kindly saw him at my request. 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 My concern was the degree of erythema medially extending from his left leg wound which was either inflammation from pyoderma or cellulitis. I put him on Augmentin however culture of the wound showed Pseudomonas which is quinolone sensitive. I really don't believe he has cellulitis however in view of everything I will continue and give him a course of Cipro. He is also on doxycycline as an immune modulator for the pyoderma. LANORRIS, KALISZ ([id number]) [id number] Page 2 of 10 In addition to his original wound on the left lateral leg with surrounding erythema he has a wound on the right posterior calf which was an original biopsy site done by dermatology. This was felt to represent pathergy from pyoderma gangrenosum Readmission: 03-25-2023 patient presents today for readmission here in the clinic concerning issues he  is having with a wound in the same area that we previously took care of for him. He tells me that around 24 October he  had an area that opened and he just has not been able to get the close he thought it might be best to come back here as opposed to try to do it himself and letting it get worse. He continues with the Remicade infusions every 6 weeks that really has not changed. His past medical history also has not changed significantly from what I saw him last. He was discharged October 2023. Electronic Signature(s) Signed: 03/25/2023 1:39:00 PM By: Allen Derry PA-C Entered By: Allen Derry on 03/25/2023 13:38:59 -------------------------------------------------------------------------------- Physical Exam Details Patient Name: Date of Service: [name], [name] 03/25/2023 8:15 A M Medical Record Number: [id number] Patient Account Number: [id number] Date of Birth/Sex: Treating RN: 17-May-1981 (42 y.o. [name] Primary Care Provider: [name] Other Clinician: Referring Provider: Treating Provider/Extender: Robbie Louis in Treatment: 0 Constitutional sitting or standing blood pressure is within target range for patient.. pulse regular and within target range for patient.Marland Kitchen respirations regular, non-labored and within target range for patient.Marland Kitchen temperature within target range for patient.. Well-nourished and well-hydrated in no acute distress. Eyes conjunctiva clear no eyelid edema noted. pupils equal round and reactive to light and accommodation. Ears, Nose, Mouth, and Throat no gross abnormality of ear auricles or external auditory canals. normal hearing noted during conversation. mucus membranes moist. Respiratory normal breathing without difficulty. Cardiovascular 2+ dorsalis pedis/posterior tibialis pulses. 1+ pitting edema of the bilateral lower extremities. Musculoskeletal normal gait and posture. no significant deformity or arthritic changes, no loss or range of motion, no clubbing. Psychiatric this patient is able to make decisions and demonstrates good insight  into disease process. Alert and Oriented x 3. pleasant and cooperative. Notes Upon inspection patient's wound bed actually showed signs of poor granulation and epithelization at this point he does have an open area that unfortunately is continuing to give him some trouble. Fortunately I do not see any signs of active infection which is good news although I do believe that the having inflammation though not as bad as previous is still present. He tells me that he is still having some issues here with swelling and there are times when he has not worn the compression socks which I think is what led to him having the reopening of the wound at this point. Electronic Signature(s) Signed: 03/25/2023 1:39:37 PM By: Allen Derry PA-C Entered By: Allen Derry on 03/25/2023 13:39:37 Geng, [name] ([id number]) [id number] Page 3 of 10 -------------------------------------------------------------------------------- Physician Orders Details Patient Name: Date of Service: [name], [name] 03/25/2023 8:15 A M Medical Record Number: [id number] Patient Account Number: [id number] Date of Birth/Sex: Treating RN: October 26, 1980 (42 y.o. [name] Primary Care Provider: [name] Other Clinician: Referring Provider: Treating Provider/Extender: Robbie Louis in Treatment: 0 The following information was scribed by: Angelina Pih The information was scribed for: Allen Derry Verbal / Phone Orders: No Diagnosis Coding ICD-10 Coding Code Description [phone] Chronic venous hypertension (idiopathic) with ulcer and inflammation of left lower extremity L97.222 Non-pressure chronic ulcer of left calf with fat layer exposed E11.622 Type 2 diabetes mellitus with other skin ulcer L88 Pyoderma gangrenosum F17.208 Nicotine dependence, unspecified, with other nicotine-induced disorders Follow-up Appointments Return Appointment in 1 week. Bathing/ Shower/  Hygiene May shower with wound dressing protected with water repellent cover or cast protector. No tub bath. Anesthetic (Use 'Patient Medications'  Section for Anesthetic Order Entry) Lidocaine applied to wound bed Edema Control - Orders / Instructions Elevate, Exercise Daily and A void Standing for Long Periods of Time. Elevate leg(s) parallel to the floor when sitting. DO YOUR BEST to sleep in the bed at night. DO NOT sleep in your recliner. Long hours of sitting in a recliner leads to swelling of the legs and/or potential wounds on your backside. Wound Treatment Wound #4 - Lower Leg Wound Laterality: Left, Lateral Cleanser: Soap and Water 1 x Per Week/15 Days Discharge Instructions: Gently cleanse wound with antibacterial soap, rinse and pat dry prior to dressing wounds Cleanser: Wound Cleanser 1 x Per Week/15 Days Discharge Instructions: Wash your hands with soap and water. Remove old dressing, discard into plastic bag and place into trash. Cleanse the wound with Wound Cleanser prior to applying a clean dressing using gauze sponges, not tissues or cotton balls. Do not scrub or use excessive force. Pat dry using gauze sponges, not tissue or cotton balls. Topical: Clobetasol Propionate ointment 0.05%, 60 (g) tube 1 x Per Week/15 Days Discharge Instructions: over top promogram and around edges of wound Apply as directed. Prim Dressing: Promogran Matrix 4.34 (in) 1 x Per Week/15 Days ary Discharge Instructions: Moisten w/normal saline or sterile water; Cover wound as directed. Do not remove from wound bed. Secondary Dressing: Zetuvit Plus 4x4 (in/in) 1 x Per Week/15 Days Compression Wrap: Urgo K2, two layer compression system, large 1 x Per Week/15 Days Electronic Signature(s) Signed: 03/25/2023 3:24:56 PM By: Allen Derry PA-C Signed: 03/25/2023 4:26:12 PM By: [name] Ala, [name] ([id number]) [id number] Page 4 of 10 Entered By: Angelina Pih on  03/25/2023 09:35:30 -------------------------------------------------------------------------------- Problem List Details Patient Name: Date of Service: [place], [name] 03/25/2023 8:15 A M Medical Record Number: [id number] Patient Account Number: [id number] Date of Birth/Sex: Treating RN: November 26, 1980 (42 y.o. [name] Primary Care Provider: [name] Other Clinician: Referring Provider: Treating Provider/Extender: Robbie Louis in Treatment: 0 Active Problems ICD-10 Encounter Code Description Active Date MDM Diagnosis I87.332 Chronic venous hypertension (idiopathic) with ulcer and inflammation of left 03/25/2023 No Yes lower extremity L97.222 Non-pressure chronic ulcer of left calf with fat layer exposed 03/25/2023 No Yes E11.622 Type 2 diabetes mellitus with other skin ulcer 03/25/2023 No Yes L88 Pyoderma gangrenosum 03/25/2023 No Yes F17.208 Nicotine dependence, unspecified, with other nicotine-induced disorders 03/25/2023 No Yes Inactive Problems Resolved Problems Electronic Signature(s) Signed: 03/25/2023 8:39:18 AM By: Allen Derry PA-C Entered By: Allen Derry on 03/25/2023 08:39:17 Progress Note Details -------------------------------------------------------------------------------- [name] ([id number]) [id number] Page 5 of 10 Patient Name: Date of Service: [place], [name] 03/25/2023 8:15 A M Medical Record Number: [id number] Patient Account Number: [id number] Date of Birth/Sex: Treating RN: Jun 22, 1980 (42 y.o. [name] Primary Care Provider: [name] Other Clinician: Referring Provider: Treating Provider/Extender: Robbie Louis in Treatment: 0 Subjective Chief Complaint Information obtained from Patient LLE pyoderma ulcer History of Present Illness (HPI) 12/04/16; 42 year old man who comes into the clinic today for review of a wound on the posterior left calf. He tells  me that is been there for about a year. He is not a diabetic he does smoke half a pack per day. He was seen in the ER on 11/20/16 felt to have cellulitis around the wound and was given clindamycin. An x- ray did not show osteomyelitis. The patient initially tells me that he has a milk allergy that sets off a pruritic itching rash on his lower  legs which she scratches incessantly and he thinks that's what may have set up the wound. He has been using various topical antibiotics and ointments without any effect. He works in a [employer]ing [employer] and is on his feet all day. He does not have a prior history of wounds however he does have the rash on both lower legs the right arm and the ventral aspect of his left arm. These are excoriations and clearly have had scratching however there are of macular looking areas on both legs including a substantial larger area on the right leg. This does not have an underlying open area. There is no blistering. 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 He seemed to recognize this. On further questioning he admits to a 5 year history of quiesced. ulcerative colitis. He is not in any treatment for this. He's had no recent travel 04/02/17; the patient was seen last week by Dr. Carles Collet at [facility] dermatology locally who kindly saw him at my request. 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 My concern was the degree of erythema medially extending from his left leg wound which was either inflammation from pyoderma or cellulitis. I put him on Augmentin however culture of the wound showed Pseudomonas which is quinolone sensitive. I really don't believe he has cellulitis however in view of everything I will continue and give him a course of Cipro. He  is also on doxycycline as an immune modulator for the pyoderma. In addition to his original wound on the left lateral leg with surrounding erythema he has a wound on the right posterior calf which was an original biopsy site done by dermatology. This was felt to represent pathergy from pyoderma gangrenosum Readmission: 03-25-2023 patient presents today for readmission here in the clinic concerning issues he is having with a wound in the same area that we previously took care of for him. He tells me that around 24 October he had an area that opened and he just has not been able to get the close he thought it might be best to come back here as opposed to try to do it himself and letting it get worse. He continues with the Remicade infusions every 6 weeks that really has not changed. His past medical history also has not changed significantly from what I saw him last. He was discharged October 2023. Patient History Information obtained from Patient. Allergies milk, Biaxin, silver General Notes: seasonal allergies Family History Diabetes - Mother,Maternal Grandparents, Heart Disease - Mother,Maternal Grandparents, Hereditary Spherocytosis - Siblings, Hypertension - Maternal Grandparents, Stroke - Siblings, No family history of Cancer, Kidney Disease, Lung Disease, Seizures, Thyroid Problems, Tuberculosis. Social History Current some day smoker, Marital Status - Married, Alcohol Use - Rarely, Drug Use - No History, Caffeine Use - Moderate. Medical History Eyes Denies history of Cataracts, Glaucoma, Optic Neuritis Ear/Nose/Mouth/Throat Denies history of Chronic sinus problems/congestion, Middle ear problems Hematologic/Lymphatic Denies history of Anemia, Hemophilia, Human Immunodeficiency Virus, Lymphedema, Sickle Cell Disease Respiratory Patient has history of Sleep Apnea - cpap Denies history of Aspiration, Asthma, Chronic Obstructive Pulmonary Disease (COPD),  Pneumothorax Cardiovascular Patient has history of Hypertension Denies history of Angina, Arrhythmia, Congestive Heart Failure, Coronary Artery Disease, Deep Vein Thrombosis, Hypotension, Myocardial Infarction, Peripheral Arterial Disease,  Peripheral Venous Disease, Phlebitis, Vasculitis Gastrointestinal Patient has history of Colitis Endocrine Patient has history of Type II Diabetes Denies history of Type I Diabetes Genitourinary Denies history of End Stage Renal Disease Immunological Denies history of Lupus Erythematosus, Raynauds, Scleroderma Integumentary (Skin) Denies history of History of Burn, History of pressure wounds Musculoskeletal Denies history of Gout, Rheumatoid Arthritis, Osteoarthritis, Osteomyelitis Neurologic Denies history of Dementia, Neuropathy, Quadriplegia, Paraplegia, Seizure Disorder Malay, Noam ([id number]) [id number] Page 6 of 10 Oncologic Denies history of Received Chemotherapy, Received Radiation Psychiatric Denies history of Anorexia/bulimia, Confinement Anxiety Patient is treated with Controlled Diet, Insulin, Oral Agents. Blood sugar is tested. Review of Systems (ROS) Constitutional Symptoms (General Health) Denies complaints or symptoms of Fatigue, Fever, Chills, Marked Weight Change. Eyes Complains or has symptoms of Glasses / Contacts. Genitourinary Denies complaints or symptoms of Kidney failure/ Dialysis, Incontinence/dribbling. Immunological Denies complaints or symptoms of Hives, Itching, infusions Remicade every 6 weeks Integumentary (Skin) Complains or has symptoms of Wounds. Musculoskeletal Denies complaints or symptoms of Muscle Pain, Muscle Weakness. Neurologic Denies complaints or symptoms of Numbness/parasthesias, Focal/Weakness. Psychiatric Denies complaints or symptoms of Anxiety, Claustrophobia. Objective Constitutional sitting or standing blood pressure is within target range for patient..  pulse regular and within target range for patient.Marland Kitchen respirations regular, non-labored and within target range for patient.Marland Kitchen temperature within target range for patient.. Well-nourished and well-hydrated in no acute distress. Vitals Time Taken: 8:17 AM, Height: 70 in, Source: Stated, Weight: 329 lbs, Source: Stated, BMI: 47.2, Temperature: 97.9 F, Pulse: 82 bpm, Respiratory Rate: 18 breaths/min, Blood Pressure: 133/85 mmHg. Eyes conjunctiva clear no eyelid edema noted. pupils equal round and reactive to light and accommodation. Ears, Nose, Mouth, and Throat no gross abnormality of ear auricles or external auditory canals. normal hearing noted during conversation. mucus membranes moist. Respiratory normal breathing without difficulty. Cardiovascular 2+ dorsalis pedis/posterior tibialis pulses. 1+ pitting edema of the bilateral lower extremities. Musculoskeletal normal gait and posture. no significant deformity or arthritic changes, no loss or range of motion, no clubbing. Psychiatric this patient is able to make decisions and demonstrates good insight into disease process. Alert and Oriented x 3. pleasant and cooperative. General Notes: Upon inspection patient's wound bed actually showed signs of poor granulation and epithelization at this point he does have an open area that unfortunately is continuing to give him some trouble. Fortunately I do not see any signs of active infection which is good news although I do believe that the having inflammation though not as bad as previous is still present. He tells me that he is still having some issues here with swelling and there are times when he has not worn the compression socks which I think is what led to him having the reopening of the wound at this point. Integumentary (Hair, Skin) Wound #4 status is Open. Original cause of wound was Gradually Appeared. The date acquired was: 03/13/2023. The wound is located on the Left,Lateral Lower Leg. The  wound measures 1.7cm length x 1.3cm width x 0.2cm depth; 1.736cm^2 area and 0.347cm^3 volume. There is Fat Layer (Subcutaneous Tissue) exposed. There is no tunneling or undermining noted. There is a medium amount of serosanguineous drainage noted. There is small (1-33%) red, pink granulation within the wound bed. There is a large (67-100%) amount of necrotic tissue within the wound bed including Adherent Slough. Assessment Active Problems ICD-10 Chronic venous hypertension (idiopathic) with ulcer and inflammation of left lower extremity Non-pressure chronic ulcer of left calf with fat layer exposed Type 2 diabetes mellitus  with other skin ulcer Pyoderma gangrenosum Nicotine dependence, unspecified, with other nicotine-induced disorders [name], [name] ([id number]) [phone].pdf Page 7 of 10 Procedures Wound #4 Pre-procedure diagnosis of Wound #4 is a Pyoderma located on the Left,Lateral Lower Leg . There was a Double Layer Compression Therapy Procedure by Angelina Pih, RN. Post procedure Diagnosis Wound #4: Same as Pre-Procedure Plan Follow-up Appointments: Return Appointment in 1 week. Bathing/ Shower/ Hygiene: May shower with wound dressing protected with water repellent cover or cast protector. No tub bath. Anesthetic (Use 'Patient Medications' Section for Anesthetic Order Entry): Lidocaine applied to wound bed Edema Control - Orders / Instructions: Elevate, Exercise Daily and Avoid Standing for Long Periods of Time. Elevate leg(s) parallel to the floor when sitting. DO YOUR BEST to sleep in the bed at night. DO NOT sleep in your recliner. Long hours of sitting in a recliner leads to swelling of the legs and/or potential wounds on your backside. WOUND #4: - Lower Leg Wound Laterality: Left, Lateral Cleanser: Soap and Water 1 x Per Week/15 Days Discharge Instructions: Gently cleanse wound with antibacterial soap, rinse and pat dry prior to dressing  wounds Cleanser: Wound Cleanser 1 x Per Week/15 Days Discharge Instructions: Wash your hands with soap and water. Remove old dressing, discard into plastic bag and place into trash. Cleanse the wound with Wound Cleanser prior to applying a clean dressing using gauze sponges, not tissues or cotton balls. Do not scrub or use excessive force. Pat dry using gauze sponges, not tissue or cotton balls. Topical: Clobetasol Propionate ointment 0.05%, 60 (g) tube 1 x Per Week/15 Days Discharge Instructions: over top promogram and around edges of wound Apply as directed. Prim Dressing: Promogran Matrix 4.34 (in) 1 x Per Week/15 Days ary Discharge Instructions: Moisten w/normal saline or sterile water; Cover wound as directed. Do not remove from wound bed. Secondary Dressing: Zetuvit Plus 4x4 (in/in) 1 x Per Week/15 Days Com pression Wrap: Urgo K2, two layer compression system, large 1 x Per Week/15 Days 1. I am going to recommend that we have the patient continue to get back onto the plan as before we will go to using the Zetuvit over top of the clobetasol and collagen at this time. 2. I am good recommend as well the patient should continue with the 4-layer equivalent compression wrap which will be the Urgo K2. The 3 and also can recommend that he should continue to elevate his legs much as possible he does work I understand this when he is not working when he is at home he should try to elevate is much as possible. We will see patient back for reevaluation in 1 week here in the clinic. If anything worsens or changes patient will contact our office for additional recommendations. Electronic Signature(s) Signed: 03/25/2023 1:40:13 PM By: Allen Derry PA-C Entered By: Allen Derry on 03/25/2023 13:40:13 -------------------------------------------------------------------------------- ROS/PFSH Details Patient Name: Date of Service: [name], [name] 03/25/2023 8:15 A M Medical Record Number: [id number] Patient  Account Number: [id number] Date of Birth/Sex: Treating RN: [birth year] (42 y.o. [name] Primary Care Provider: [name] Other Clinician: Referring Provider: Treating Provider/Extender: Robbie Louis in Treatment: 0 [place], [place] ([id number]) [id number] Page 8 of 10 Information Obtained From Patient Constitutional Symptoms (General Health) Complaints and Symptoms: Negative for: Fatigue; Fever; Chills; Marked Weight Change Eyes Complaints and Symptoms: Positive for: Glasses / Contacts Medical History: Negative for: Cataracts; Glaucoma; Optic Neuritis Genitourinary Complaints and Symptoms: Negative for: Kidney failure/ Dialysis; Incontinence/dribbling Medical History: Negative  for: End Stage Renal Disease Immunological Complaints and Symptoms: Negative for: Hives; Itching Review of System Notes: infusions Remicade every 6 weeks Medical History: Negative for: Lupus Erythematosus; Raynauds; Scleroderma Integumentary (Skin) Complaints and Symptoms: Positive for: Wounds Medical History: Negative for: History of Burn; History of pressure wounds Musculoskeletal Complaints and Symptoms: Negative for: Muscle Pain; Muscle Weakness Medical History: Negative for: Gout; Rheumatoid Arthritis; Osteoarthritis; Osteomyelitis Neurologic Complaints and Symptoms: Negative for: Numbness/parasthesias; Focal/Weakness Medical History: Negative for: Dementia; Neuropathy; Quadriplegia; Paraplegia; Seizure Disorder Psychiatric Complaints and Symptoms: Negative for: Anxiety; Claustrophobia Medical History: Negative for: Anorexia/bulimia; Confinement Anxiety Ear/Nose/Mouth/Throat Medical History: Negative for: Chronic sinus problems/congestion; Middle ear problems Hematologic/Lymphatic Medical History: Negative for: Anemia; Hemophilia; Human Immunodeficiency Virus; Lymphedema; Sickle Cell Disease Respiratory Medical  History: Positive for: Sleep Apnea - cpap Negative for: Aspiration; Asthma; Chronic Obstructive Pulmonary Disease (COPD); Pneumothorax [name], [name] ([id number]) [id number] Page 9 of 10 Cardiovascular Medical History: Positive for: Hypertension Negative for: Angina; Arrhythmia; Congestive Heart Failure; Coronary Artery Disease; Deep Vein Thrombosis; Hypotension; Myocardial Infarction; Peripheral Arterial Disease; Peripheral Venous Disease; Phlebitis; Vasculitis Gastrointestinal Medical History: Positive for: Colitis Endocrine Medical History: Positive for: Type II Diabetes Negative for: Type I Diabetes Time with diabetes: 4 years Treated with: Insulin, Oral agents, Diet Blood sugar tested every day: Yes T ested : CGM Oncologic Medical History: Negative for: Received Chemotherapy; Received Radiation Immunizations Pneumococcal Vaccine: Received Pneumococcal Vaccination: No Implantable Devices None Family and Social History Cancer: No; Diabetes: Yes - Mother,Maternal Grandparents; Heart Disease: Yes - Mother,Maternal Grandparents; Hereditary Spherocytosis: Yes - Siblings; Hypertension: Yes - Maternal Grandparents; Kidney Disease: No; Lung Disease: No; Seizures: No; Stroke: Yes - Siblings; Thyroid Problems: No; Tuberculosis: No; Current some day smoker; Marital Status - Married; Alcohol Use: Rarely; Drug Use: No History; Caffeine Use: Moderate; Financial Concerns: No; Food, Clothing or Shelter Needs: No; Support System Lacking: No; Transportation Concerns: No Electronic Signature(s) Signed: 03/25/2023 3:24:56 PM By: Allen Derry PA-C Signed: 03/25/2023 4:26:12 PM By: Angelina Pih Entered By: Angelina Pih on 03/25/2023 08:21:18 -------------------------------------------------------------------------------- SuperBill Details Patient Name: Date of Service: [name], [name] 03/25/2023 Medical Record Number: [id number] Patient Account Number:  [id number] Date of Birth/Sex: Treating RN: 1981-02-23 (42 y.o. 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 Nicotine dependence, unspecified, with other nicotine-induced disorders Facility Procedures : CPT4 Code: [id number] Description: 99213 - WOUND CARE VISIT-LEV 3 EST PT Modifier: Quantity: 1 : CPT4 Code: 23536144 Description: (Facility Use Only) [phone] - APPLY MULTLAY COMPRS LWR LT LEG Modifier: Quantity: 1 Physician Procedures : CPT4 Code Description Modifier [id number] 99214 - WC PHYS LEVEL 4 - EST PT ICD-10 Diagnosis Description I87.332 Chronic venous hypertension (idiopathic) with ulcer and inflammation of left lower extremity L97.222 Non-pressure chronic ulcer of left calf  with fat layer exposed E11.622 Type 2 diabetes mellitus with other skin ulcer L88 Pyoderma gangrenosum Quantity: 1 Electronic Signature(s) Signed: 03/25/2023 1:40:38 PM By: Allen Derry PA-C Previous Signature: 03/25/2023 9:36:05 AM Version By: Angelina Pih Entered By: Allen Derry on 03/25/2023 13:40:38

## 2023-03-26 NOTE — Progress Notes (Signed)
[name], [name] ([id number]) [id number] Page 1 of 9 Visit Report for 03/25/2023 Allergy List Details Patient Name: Date of Service: [name] BERT 03/25/2023 8:15 A M Medical Record Number: [id number] Patient Account Number: [id number] Date of Birth/Sex: Treating RN: 1981-02-26 (42 y.o. [name] Primary Care Sandford Diop: [name] Other Clinician: Referring Cheral Cappucci: Treating Tyge Somers/Extender: Robbie Louis in Treatment: 0 Allergies Active Allergies milk Biaxin silver Allergy Notes seasonal allergies Electronic Signature(s) Signed: 03/25/2023 4:26:12 PM By: Angelina Pih Entered By: Angelina Pih on 03/25/2023 09:03:27 -------------------------------------------------------------------------------- Arrival Information Details Patient Name: Date of Service: [name], [name] BERT 03/25/2023 8:15 A M Medical Record Number: [id number] Patient Account Number: [id number] Date of Birth/Sex: Treating RN: [birth year] (42 y.o. [name] Primary Care Keiasia Christianson: [name] Other Clinician: Referring Takeira Yanes: Treating Zaeden Lastinger/Extender: Robbie Louis in Treatment: 0 Visit Information Patient Arrived: Ambulatory Arrival Time: 08:09 Accompanied By: self Transfer Assistance: None Patient Identification Verified: Yes Secondary Verification Process CompletedSHERON, [name] ([id number]) History Since Last Visit Added or deleted any medications: No Any new allergies or adverse reactions: No Had a fall or experienced change in activities of daily living that may affect risk of falls: No Hospitalized since last visit: No Has Dressing in Place as Prescribed: Yes Has Compression in Place as Prescribed: Yes Pain Present Now: Yes Electronic Signature(s) Signed: 03/25/2023 4:26:12 PM By: Angelina Pih Entered By: Angelina Pih on 03/25/2023  08:16:52 -------------------------------------------------------------------------------- Clinic Level of Care Assessment Details Patient Name: Date of Service: [name] BERT 03/25/2023 8:15 A M Medical Record Number: [id number] Patient Account Number: [id number] Date of Birth/Sex: Treating RN: 08/20/1980 (42 y.o. [name] Primary Care Bradyn Vassey: [name] Other Clinician: Referring Karol Liendo: Treating Maizey Menendez/Extender: Robbie Louis in Treatment: 0 Clinic Level of Care Assessment Items TOOL 1 Quantity Score []  - 0 Use when EandM and Procedure is performed on INITIAL visit ASSESSMENTS - Nursing Assessment / Reassessment X- 1 20 General Physical Exam (combine w/ comprehensive assessment (listed just below) when performed on new pt. evals) X- 1 25 Comprehensive Assessment (HX, ROS, Risk Assessments, Wounds Hx, etc.) ASSESSMENTS - Wound and Skin Assessment / Reassessment []  - 0 Dermatologic / Skin Assessment (not related to wound area) ASSESSMENTS - Ostomy and/or Continence Assessment and Care []  - 0 Incontinence Assessment and Management []  - 0 Ostomy Care Assessment and Management (repouching, etc.) PROCESS - Coordination of Care X - Simple Patient / Family Education for ongoing care 1 15 []  - 0 Complex (extensive) Patient / Family Education for ongoing care X- 1 10 Staff obtains Chiropractor, Records, T Results / Process Orders est []  - 0 Staff telephones HHA, Nursing Homes / Clarify orders / etc []  - 0 Routine Transfer to another Facility (non-emergent condition) []  - 0 Routine Hospital Admission (non-emergent condition) X- 1 15 New Admissions / Manufacturing engineer / Ordering NPWT Apligraf, etc. , []  - 0 Emergency Hospital Admission (emergent condition) PROCESS - Special Needs []  - 0 Pediatric / Minor Patient Management []  - 0 Isolation Patient Management []  - 0 Hearing / Language / Visual special needs []  - 0 Assessment of  Community assistance (transportation, D/C planning, etc.) []  - 0 Additional assistance / Altered mentation []  - 0 Support Surface(s) Assessment (bed, cushion, seat, etc.) INTERVENTIONS - Miscellaneous []  - 0 External ear exam Sackrider, [name] ([id number]) [id number] Page 3 of 9 []  - 0 Patient Transfer (multiple staff / Nurse, adult / Similar devices) []  - 0 Simple Staple /  Suture removal (25 or less) []  - 0 Complex Staple / Suture removal (26 or more) []  - 0 Hypo/Hyperglycemic Management (do not check if billed separately) X- 1 15 Ankle / Brachial Index (ABI) - do not check if billed separately Has the patient been seen at the hospital within the last three years: Yes Total Score: 100 Level Of Care: New/Established - Level 3 Electronic Signature(s) Signed: 03/25/2023 4:26:12 PM By: Angelina Pih Entered By: Angelina Pih on 03/25/2023 09:35:54 -------------------------------------------------------------------------------- Compression Therapy Details Patient Name: Date of Service: [name], [name] BERT 03/25/2023 8:15 A M Medical Record Number: [id number] Patient Account Number: [id number] Date of Birth/Sex: Treating RN: 05-16-1981 (42 y.o. [name] Primary Care Elfie Costanza: [name] Other Clinician: Referring Daxen Lanum: Treating Marae Cottrell/Extender: Robbie Louis in Treatment: 0 Compression Therapy Performed for Wound Assessment: Wound #4 Left,Lateral Lower Leg Performed By: Clinician Angelina Pih, RN Compression Type: Double Layer Post Procedure Diagnosis Same as Pre-procedure Electronic Signature(s) Signed: 03/25/2023 9:34:31 AM By: Angelina Pih Entered By: Angelina Pih on 03/25/2023 09:34:31 -------------------------------------------------------------------------------- Encounter Discharge Information Details Patient Name: Date of Service: [name], [name] BERT 03/25/2023 8:15 A M Medical Record Number:  [id number] Patient Account Number: [id number] Date of Birth/Sex: Treating RN: [birth year] (42 y.o. [name] Primary Care Nathan Stallworth: [name] Other Clinician: Referring Arieon Corcoran: Treating Yarissa Reining/Extender: Robbie Louis in Treatment: 0 Encounter Discharge Information Items Discharge Condition: [place], [name] ([id number]) [id number] Page 4 of 9 Ambulatory Status: Ambulatory Discharge Destination: Home Transportation: Private Auto Accompanied By: self Schedule Follow-up Appointment: Yes Clinical Summary of Care: Electronic Signature(s) Signed: 03/25/2023 9:41:09 AM By: Angelina Pih Entered By: Angelina Pih on 03/25/2023 09:41:09 -------------------------------------------------------------------------------- Lower Extremity Assessment Details Patient Name: Date of Service: [name] BERT 03/25/2023 8:15 A M Medical Record Number: [id number] Patient Account Number: [id number] Date of Birth/Sex: Treating RN: 09-02-1980 (42 y.o. [name] Primary Care Suren Payne: [name] Other Clinician: Referring Miasha Emmons: Treating Benjamim Harnish/Extender: Robbie Louis in Treatment: 0 Edema Assessment Assessed: [Left: No] [Right: No] Edema: [Left: N] [Right: o] Calf Left: Right: Point of Measurement: 36 cm From Medial Instep 46.9 cm Ankle Left: Right: Point of Measurement: 11 cm From Medial Instep 28.5 cm Vascular Assessment Pulses: Dorsalis Pedis Palpable: [Left:Yes] Posterior Tibial Palpable: [Left:Yes] Extremity colors, hair growth, and conditions: Extremity Color: [Left:Normal] Hair Growth on Extremity: [Left:Yes] Temperature of Extremity: [Left:Warm] Capillary Refill: [Left:< 3 seconds] Blood Pressure: Brachial: [Left:133] Ankle: [Left:Dorsalis Pedis: 120 0.90] Toe Nail Assessment Left: Right: Thick: Yes Discolored: No Deformed: No Improper Length and Hygiene:  No Electronic Signature(s) Signed: 03/25/2023 8:37:01 AM By: [name] Ala, ROBERT11/09/2022 8:37:01 AM By: Angelina Pih Signed: ([id number]) [id number] Page 5 of 9 Entered By: Angelina Pih on 03/25/2023 08:37:00 -------------------------------------------------------------------------------- Multi Wound Chart Details Patient Name: Date of Service: [place], [name] BERT 03/25/2023 8:15 A M Medical Record Number: [id number] Patient Account Number: [id number] Date of Birth/Sex: Treating RN: 1980-12-25 (42 y.o. [name] Primary Care Willett Lefeber: [name] Other Clinician: Referring Kristalynn Coddington: Treating Fredricka Kohrs/Extender: Robbie Louis in Treatment: 0 Vital Signs Height(in): 70 Pulse(bpm): 82 Weight(lbs): 329 Blood Pressure(mmHg): 133/85 Body Mass Index(BMI): 47.2 Temperature(F): 97.9 Respiratory Rate(breaths/min): 18 [4:Photos:] [N/A:N/A] Left, Lateral Lower Leg N/A N/A Wound Location: Gradually Appeared N/A N/A Wounding Event: T be determined o N/A N/A Primary Etiology: Sleep Apnea, Hypertension, Colitis, N/A N/A Comorbid History: Type II Diabetes 03/13/2023 N/A N/A Date Acquired: 0 N/A N/A Weeks of Treatment: Open N/A N/A Wound Status: No  N/A N/A Wound Recurrence: Yes N/A N/A Clustered Wound: 2 N/A N/A Clustered Quantity: 1.7x1.3x0.2 N/A N/A Measurements L x W x D (cm) 1.736 N/A N/A A (cm) : rea 0.347 N/A N/A Volume (cm) : Full Thickness Without Exposed N/A N/A Classification: Support Structures Medium N/A N/A Exudate A mount: Serosanguineous N/A N/A Exudate Type: red, brown N/A N/A Exudate Color: Small (1-33%) N/A N/A Granulation A mount: Red, Pink N/A N/A Granulation Quality: Large (67-100%) N/A N/A Necrotic A mount: Fat Layer (Subcutaneous Tissue): Yes N/A N/A Exposed Structures: None N/A N/A Epithelialization: Treatment Notes Electronic Signature(s) Signed: 03/25/2023 9:34:01 AM  By: Angelina Pih Entered By: Angelina Pih on 03/25/2023 09:34:01 [place], [name] ([id number]) [id number] Page 6 of 9 -------------------------------------------------------------------------------- Multi-Disciplinary Care Plan Details Patient Name: Date of Service: [place], [name] BERT 03/25/2023 8:15 A M Medical Record Number: [id number] Patient Account Number: [id number] Date of Birth/Sex: Treating RN: 1980-10-26 (42 y.o. [name] Primary Care Diedre Maclellan: [name] Other Clinician: Referring Shalona Harbour: Treating Kmarion Rawl/Extender: Robbie Louis in Treatment: 0 Active Inactive Wound/Skin Impairment Nursing Diagnoses: Impaired tissue integrity Knowledge deficit related to ulceration/compromised skin integrity Goals: Ulcer/skin breakdown will have a volume reduction of 30% by week 4 Date Initiated: 03/25/2023 Target Resolution Date: 04/22/2023 Goal Status: Active Ulcer/skin breakdown will have a volume reduction of 50% by week 8 Date Initiated: 03/25/2023 Target Resolution Date: 05/20/2023 Goal Status: Active Ulcer/skin breakdown will have a volume reduction of 80% by week 12 Date Initiated: 03/25/2023 Target Resolution Date: 06/17/2023 Goal Status: Active Ulcer/skin breakdown will heal within 14 weeks Date Initiated: 03/25/2023 Target Resolution Date: 07/01/2023 Goal Status: Active Interventions: Assess patient/caregiver ability to obtain necessary supplies Assess patient/caregiver ability to perform ulcer/skin care regimen upon admission and as needed Assess ulceration(s) every visit Provide education on ulcer and skin care Notes: Electronic Signature(s) Signed: 03/25/2023 9:37:38 AM By: Angelina Pih Entered By: Angelina Pih on 03/25/2023 09:37:38 -------------------------------------------------------------------------------- Pain Assessment Details Patient Name: Date of Service: [name], [name] BERT 03/25/2023 8:15 A  M Medical Record Number: [id number] Patient Account Number: [id number] Date of Birth/Sex: Treating RN: 03/06/1981 (42 y.o. [name] [place], [place] ([id number]) [id number] Page 7 of 9 Primary Care Dheeraj Hail: [name] Other Clinician: Referring Terrisha Lopata: Treating Marquice Uddin/Extender: Robbie Louis in Treatment: 0 Active Problems Location of Pain Severity and Description of Pain Patient Has Paino Yes Site Locations Rate the pain. Current Pain Level: 1 Pain Management and Medication Current Pain Management: Electronic Signature(s) Signed: 03/25/2023 4:26:12 PM By: Angelina Pih Entered By: Angelina Pih on 03/25/2023 08:17:11 -------------------------------------------------------------------------------- Patient/Caregiver Education Details Patient Name: Date of Service: [name], [name] BERT 11/5/2024andnbsp8:15 A M Medical Record Number: [id number] Patient Account Number: [id number] Date of Birth/Gender: Treating RN: [birth year] (42 y.o. [name] Primary Care Physician: [name] Other Clinician: Referring Physician: Treating Physician/Extender: Robbie Louis in Treatment: 0 Education Assessment Education Provided To: Patient Education Topics Provided Welcome [facility]New [facility] Packet: o Handouts: [facility] P[facility] form, Welcome T [facility] o Methods: Explain/Verbal Responses: State content correctly Wound/Skin Impairment: Handouts: Caring for Your Ulcer Methods: Explain/Verbal Responses: State content correctly [place], [name] ([id number]) [id number] Page 8 of 9 Electronic Signature(s) Signed: 03/25/2023 4:26:12 PM By: Angelina Pih Entered By: Angelina Pih on 03/25/2023 09:40:09 -------------------------------------------------------------------------------- Wound Assessment Details Patient Name: Date of  Service: [name] BERT 03/25/2023 8:15 A M Medical Record Number: [id number] Patient Account Number: [id number] Date of Birth/Sex: Treating RN: 07-Jan-1981 (42 y.o.  [name] Primary Care Hephzibah Strehle: [name] Other Clinician: Referring Dustine Bertini: Treating Jamal Pavon/Extender: Robbie Louis in Treatment: 0 Wound Status Wound Number: 4 Primary Etiology: T be determined o Wound Location: Left, Lateral Lower Leg Wound Status: Open Wounding Event: Gradually Appeared Comorbid History: Sleep Apnea, Hypertension, Colitis, Type II Diabetes Date Acquired: 03/13/2023 Weeks Of Treatment: 0 Clustered Wound: Yes Photos Wound Measurements Length: (cm) Width: (cm) Depth: (cm) Clustered Quantity: Area: (cm) Volume: (cm) 1.7 % Reduction in Area: 1.3 % Reduction in Volume: 0.2 Epithelialization: None 2 Tunneling: No 1.736 Undermining: No 0.347 Wound Description Classification: Full Thickness Without Exposed Supp Exudate Amount: Medium Exudate Type: Serosanguineous Exudate Color: red, brown ort Structures Foul Odor After Cleansing: No Slough/Fibrino Yes Wound Bed Granulation Amount: Small (1-33%) Exposed Structure Granulation Quality: Red, Pink Fat Layer (Subcutaneous Tissue) Exposed: Yes Necrotic Amount: Large (67-100%) Necrotic Quality: Adherent Scientist, physiological) Signed: 03/25/2023 8:43:45 AM By: [name] Ala, ROBERT11/09/2022 8:43:45 AM By: Angelina Pih Signed: ([id number]) [id number] Page 9 of 9 Entered By: Angelina Pih on 03/25/2023 08:43:45 -------------------------------------------------------------------------------- Vitals Details Patient Name: Date of Service: [name] BERT 03/25/2023 8:15 A M Medical Record Number: [id number] Patient Account Number: [id number] Date of Birth/Sex: Treating RN: [birth year] (42 y.o. [name] Primary Care Lindora Alviar: [name] Other  Clinician: Referring Nevaya Nagele: Treating Vershawn Westrup/Extender: Robbie Louis in Treatment: 0 Vital Signs Time Taken: 08:17 Temperature (F): 97.9 Height (in): 70 Pulse (bpm): 82 Source: Stated Respiratory Rate (breaths/min): 18 Weight (lbs): 329 Blood Pressure (mmHg): 133/85 Source: Stated Reference Range: 80 - 120 mg / dl Body Mass Index (BMI): 47.2 Electronic Signature(s) Signed: 03/25/2023 4:26:12 PM By: Angelina Pih Entered By: Angelina Pih on 03/25/2023 08:17:38

## 2023-04-01 ENCOUNTER — Encounter: Payer: 59 | Admitting: Physician Assistant

## 2023-04-01 DIAGNOSIS — I87332 Chronic venous hypertension (idiopathic) with ulcer and inflammation of left lower extremity: Secondary | ICD-10-CM | POA: Diagnosis not present

## 2023-04-01 NOTE — Progress Notes (Signed)
[name], [name] ([id number]) [id number] Page 1 of 7 Visit Report for 04/01/2023 Chief Complaint Document Details Patient Name: Date of Service: [name], [name] 04/01/2023 8:15 A M Medical Record Number: [id number] Patient Account Number: [id number] Date of Birth/Sex: Treating RN: [birth year] (42 y.o. Roel Cluck Primary Care Provider: [name] Other Clinician: Betha Loa Referring Provider: Treating Provider/Extender: Robbie Louis in Treatment: 1 Information Obtained from: Patient Chief Complaint LLE pyoderma ulcer Electronic Signature(s) Signed: 04/01/2023 8:24:55 AM By: Allen Derry PA-C Entered By: Allen Derry on 04/01/2023 08:24:55 -------------------------------------------------------------------------------- HPI Details Patient Name: Date of Service: [name], [name] 04/01/2023 8:15 A M Medical Record Number: [id number] Patient Account Number: [id number] Date of Birth/Sex: Treating RN: 1980/06/12 (42 y.o. Roel Cluck Primary Care Provider: [name] Other Clinician: Betha Loa Referring Provider: Treating Provider/Extender: Robbie Louis in Treatment: 1 History of Present Illness HPI Description: 12/04/16; 42 year old man who comes into the clinic today for review of a wound on the posterior left calf. He tells me that is been there for about a year. He is not a diabetic he does smoke half a pack per day. He was seen in the ER on 11/20/16 felt to have cellulitis around the wound and was given clindamycin. An x-ray did not show osteomyelitis. The patient initially tells me that he has a milk allergy that sets off a pruritic itching rash on his lower legs which she scratches incessantly and he thinks that's what may have set up the wound. He has been using various topical antibiotics and ointments without any effect. He works in a [employer]ing [employer] and is on his feet all day. He does not  have a prior history of wounds however he does have the rash on both lower legs the right arm and the ventral aspect of his left arm. These are excoriations and clearly have had scratching however there are of macular looking areas on both legs including a substantial larger area on the right leg. This does not have an underlying open area. There is no blistering. 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 He seemed to recognize this. On further questioning he admits to a 5 year history of quiesced. ulcerative colitis. He is not in any treatment for this. He's had no recent travel 04/02/17; the patient was seen last week by Dr. Carles Collet at [facility] dermatology locally who kindly saw him at my request. 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 My concern was the degree of erythema medially extending from his left leg wound which was either inflammation from pyoderma or cellulitis. I put him on Augmentin however culture of the wound showed Pseudomonas which is quinolone sensitive. I really don't believe he has cellulitis however in view of everything I will continue and give him a course of Cipro. He is also on doxycycline as an immune modulator for the pyoderma. CORTLANDT, [name] ([id number]) [id number] Page 2 of 7 In addition to his original wound on the left lateral leg with surrounding erythema he has a wound on the right posterior calf which was an original biopsy site done by dermatology. This was felt to represent pathergy from pyoderma gangrenosum Readmission: 03-25-2023 patient presents today for readmission here in the  clinic concerning issues he is having with a wound in the same area that we previously took care of for him. He tells  me that around 24 October he had an area that opened and he just has not been able to get the close he thought it might be best to come back here as opposed to try to do it himself and letting it get worse. He continues with the Remicade infusions every 6 weeks that really has not changed. His past medical history also has not changed significantly from what I saw him last. He was discharged October 2023. 04-01-2023 upon evaluation today patient appears to be doing well currently in regard to his wound there is no signs of infection I think that the film and biofilm on the surface of the wound went right off there was no need for sharp debridement with this being pyoderma he does not allow for debridement anyways. Fortunately I do think that he is making really good progress here towards closure however and I feel like that the patient is doing quite well already and its only been 1 week. The wrap did start to slide on Sunday he took it off and use his own compression he did a very good job keeping this under control his leg is definitely smaller this week compared to last week. Electronic Signature(s) Signed: 04/01/2023 10:06:49 AM By: Allen Derry PA-C Entered By: Allen Derry on 04/01/2023 10:06:49 -------------------------------------------------------------------------------- Physical Exam Details Patient Name: Date of Service: [name], [name] 04/01/2023 8:15 A M Medical Record Number: [id number] Patient Account Number: [id number] Date of Birth/Sex: Treating RN: [birth year] (42 y.o. Roel Cluck Primary Care Provider: [name] Other Clinician: Betha Loa Referring Provider: Treating Provider/Extender: Robbie Louis in Treatment: 1 Constitutional Well-nourished and well-hydrated in no acute distress. Respiratory normal breathing without difficulty. Psychiatric this patient is able to make decisions and demonstrates good insight into disease process.  Alert and Oriented x 3. pleasant and cooperative. Notes Upon inspection patient's wound bed actually showed signs of good granulation epithelization at this point. Fortunately I do not see any signs of worsening overall and I do believe that the patient is making good headway here towards closure which is great news and very pleased with where we stand currently. No sharp debridement performed today due to this being a pyoderma wound. Electronic Signature(s) Signed: 04/01/2023 10:07:13 AM By: Allen Derry PA-C Entered By: Allen Derry on 04/01/2023 10:07:13 Physician Orders Details -------------------------------------------------------------------------------- [name] ([id number]) [id number] Page 3 of 7 Patient Name: Date of Service: [place], [name] 04/01/2023 8:15 A M Medical Record Number: [id number] Patient Account Number: [id number] Date of Birth/Sex: Treating RN: [birth year] (42 y.o. Roel Cluck Primary Care Provider: [name] Other Clinician: Betha Loa Referring Provider: Treating Provider/Extender: Robbie Louis in Treatment: 1 The following information was scribed by: Betha Loa The information was scribed for: Allen Derry Verbal / Phone Orders: No Diagnosis Coding ICD-10 Coding Code Description [id number] Chronic venous hypertension (idiopathic) with ulcer and inflammation of left lower extremity L97.222 Non-pressure chronic ulcer of left calf with fat layer exposed E11.622 Type 2 diabetes mellitus with other skin ulcer L88 Pyoderma gangrenosum F17.208 Nicotine dependence, unspecified, with other nicotine-induced disorders Follow-up Appointments Return Appointment in 1 week. Bathing/ Shower/ Hygiene May shower with wound dressing protected with water repellent cover or cast protector. No tub bath. Anesthetic (Use 'Patient Medications' Section for Anesthetic Order Entry) Lidocaine applied to wound  bed Edema Control -  Orders / Instructions Elevate, Exercise Daily and A void Standing for Long Periods of Time. Elevate leg(s) parallel to the floor when sitting. DO YOUR BEST to sleep in the bed at night. DO NOT sleep in your recliner. Long hours of sitting in a recliner leads to swelling of the legs and/or potential wounds on your backside. Wound Treatment Wound #4 - Lower Leg Wound Laterality: Left, Lateral Cleanser: Soap and Water 1 x Per Week/15 Days Discharge Instructions: Gently cleanse wound with antibacterial soap, rinse and pat dry prior to dressing wounds Cleanser: Wound Cleanser 1 x Per Week/15 Days Discharge Instructions: Wash your hands with soap and water. Remove old dressing, discard into plastic bag and place into trash. Cleanse the wound with Wound Cleanser prior to applying a clean dressing using gauze sponges, not tissues or cotton balls. Do not scrub or use excessive force. Pat dry using gauze sponges, not tissue or cotton balls. Topical: Clobetasol Propionate ointment 0.05%, 60 (g) tube 1 x Per Week/15 Days Discharge Instructions: apply to periwound Topical: Mupirocin Ointment 1 x Per Week/15 Days Discharge Instructions: Apply over collagen on wound bed Prim Dressing: Promogran Matrix 4.34 (in) 1 x Per Week/15 Days ary Discharge Instructions: Moisten w/normal saline or sterile water; Cover wound as directed. Do not remove from wound bed. Secondary Dressing: Zetuvit Plus 4x4 (in/in) 1 x Per Week/15 Days Compression Wrap: Urgo K2, two layer compression system, large 1 x Per Week/15 Days Electronic Signature(s) Signed: 04/01/2023 5:03:47 PM By: Betha Loa Signed: 04/03/2023 7:57:50 AM By: Allen Derry PA-C Entered By: Betha Loa on 04/01/2023 08:47:50 [name], [name] ([id number]) [id number] Page 4 of 7 -------------------------------------------------------------------------------- Problem List Details Patient Name: Date of  Service: [place], [name] 04/01/2023 8:15 A M Medical Record Number: [id number] Patient Account Number: [id number] Date of Birth/Sex: Treating RN: 1980/11/27 (42 y.o. Roel Cluck Primary Care Provider: [name] Other Clinician: Betha Loa Referring Provider: Treating Provider/Extender: Robbie Louis in Treatment: 1 Active Problems ICD-10 Encounter Code Description Active Date MDM Diagnosis I87.332 Chronic venous hypertension (idiopathic) with ulcer and inflammation of left 03/25/2023 No Yes lower extremity L97.222 Non-pressure chronic ulcer of left calf with fat layer exposed 03/25/2023 No Yes E11.622 Type 2 diabetes mellitus with other skin ulcer 03/25/2023 No Yes L88 Pyoderma gangrenosum 03/25/2023 No Yes F17.208 Nicotine dependence, unspecified, with other nicotine-induced disorders 03/25/2023 No Yes Inactive Problems Resolved Problems Electronic Signature(s) Signed: 04/01/2023 8:24:52 AM By: Allen Derry PA-C Entered By: Allen Derry on 04/01/2023 08:24:52 -------------------------------------------------------------------------------- Progress Note Details Patient Name: Date of Service: [name], [name] 04/01/2023 8:15 A M Medical Record Number: [id number] Patient Account Number: [id number] Date of Birth/Sex: Treating RN: 05-17-1981 (42 y.o. Devyn, [place], [place] ([id number]) [id number] Page 5 of 7 Primary Care Provider: [name] Other Clinician: Betha Loa Referring Provider: Treating Provider/Extender: Robbie Louis in Treatment: 1 Subjective Chief Complaint Information obtained from Patient LLE pyoderma ulcer History of Present Illness (HPI) 12/04/16; 42 year old man who comes into the clinic today for review of a wound on the posterior left calf. He tells me that is been there for about a year. He is not a diabetic he does smoke half a pack per day. He was seen in the ER  on 11/20/16 felt to have cellulitis around the wound and was given clindamycin. An x- ray did not show osteomyelitis. The patient initially tells me that he has a milk allergy that sets off a pruritic itching rash on his lower legs  which she scratches incessantly and he thinks that's what may have set up the wound. He has been using various topical antibiotics and ointments without any effect. He works in a [employer]ing [employer] and is on his feet all day. He does not have a prior history of wounds however he does have the rash on both lower legs the right arm and the ventral aspect of his left arm. These are excoriations and clearly have had scratching however there are of macular looking areas on both legs including a substantial larger area on the right leg. This does not have an underlying open area. There is no blistering. 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 He seemed to recognize this. On further questioning he admits to a 5 year history of quiesced. ulcerative colitis. He is not in any treatment for this. He's had no recent travel 04/02/17; the patient was seen last week by Dr. Carles Collet at [facility] dermatology locally who kindly saw him at my request. 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 My concern was the degree of erythema medially extending from his left leg wound which was either inflammation from pyoderma or cellulitis. I put him on Augmentin however culture of the wound showed Pseudomonas which is quinolone sensitive. I really don't believe he has cellulitis however in view of everything I will continue and give him a course of Cipro. He is also on doxycycline as an immune modulator for the pyoderma. In addition to his original wound on the left lateral  leg with surrounding erythema he has a wound on the right posterior calf which was an original biopsy site done by dermatology. This was felt to represent pathergy from pyoderma gangrenosum Readmission: 03-25-2023 patient presents today for readmission here in the clinic concerning issues he is having with a wound in the same area that we previously took care of for him. He tells me that around 24 October he had an area that opened and he just has not been able to get the close he thought it might be best to come back here as opposed to try to do it himself and letting it get worse. He continues with the Remicade infusions every 6 weeks that really has not changed. His past medical history also has not changed significantly from what I saw him last. He was discharged October 2023. 04-01-2023 upon evaluation today patient appears to be doing well currently in regard to his wound there is no signs of infection I think that the film and biofilm on the surface of the wound went right off there was no need for sharp debridement with this being pyoderma he does not allow for debridement anyways. Fortunately I do think that he is making really good progress here towards closure however and I feel like that the patient is doing quite well already and its only been 1 week. The wrap did start to slide on Sunday he took it off and use his own compression he did a very good job keeping this under control his leg is definitely smaller this week compared to last week. Objective Constitutional Well-nourished and well-hydrated in no acute distress. Vitals Time Taken: 8:20 AM, Height: 70 in, Weight: 329 lbs, BMI: 47.2, Temperature: 98.2 F, Pulse:  90 bpm, Respiratory Rate: 18 breaths/min, Blood Pressure: 118/79 mmHg. Respiratory normal breathing without difficulty. Psychiatric this patient is able to make decisions and demonstrates good insight into disease process. Alert and Oriented x 3. pleasant and  cooperative. General Notes: Upon inspection patient's wound bed actually showed signs of good granulation epithelization at this point. Fortunately I do not see any signs of worsening overall and I do believe that the patient is making good headway here towards closure which is great news and very pleased with where we stand currently. No sharp debridement performed today due to this being a pyoderma wound. Integumentary (Hair, Skin) Wound #4 status is Open. Original cause of wound was Gradually Appeared. The date acquired was: 03/13/2023. The wound has been in treatment 1 weeks. The wound is located on the Left,Lateral Lower Leg. The wound measures 1.3cm length x 1cm width x 0.2cm depth; 1.021cm^2 area and 0.204cm^3 volume. There is Fat Layer (Subcutaneous Tissue) exposed. There is a medium amount of serosanguineous drainage noted. There is small (1-33%) red, pink granulation within the wound bed. There is a large (67-100%) amount of necrotic tissue within the wound bed including Adherent Slough. 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 There was a Double Layer Compression Therapy Procedure with a pre-treatment ABI of 0.9 by Betha Loa. Post procedure Diagnosis Wound #4: Same as Pre-Procedure Plan Follow-up Appointments: Return Appointment in 1 week. Bathing/ Shower/ Hygiene: May shower with wound dressing protected with water repellent cover or cast protector. No tub bath. Anesthetic (Use 'Patient Medications'  Section for Anesthetic Order Entry): Lidocaine applied to wound bed Edema Control - Orders / Instructions: Elevate, Exercise Daily and Avoid Standing for Long Periods of Time. Elevate leg(s) parallel to the floor when sitting. DO YOUR BEST to sleep in the bed at night. DO NOT sleep in your recliner. Long hours of sitting in a recliner leads to swelling of the legs and/or potential wounds on your backside. WOUND #4: - Lower Leg Wound Laterality: Left, Lateral Cleanser: Soap and Water 1 x Per Week/15 Days Discharge Instructions: Gently cleanse wound with antibacterial soap, rinse and pat dry prior to dressing wounds Cleanser: Wound Cleanser 1 x Per Week/15 Days Discharge Instructions: Wash your hands with soap and water. Remove old dressing, discard into plastic bag and place into trash. Cleanse the wound with Wound Cleanser prior to applying a clean dressing using gauze sponges, not tissues or cotton balls. Do not scrub or use excessive force. Pat dry using gauze sponges, not tissue or cotton balls. Topical: Clobetasol Propionate ointment 0.05%, 60 (g) tube 1 x Per Week/15 Days Discharge Instructions: apply to periwound Topical: Mupirocin Ointment 1 x Per Week/15 Days Discharge Instructions: Apply over collagen on wound bed Prim Dressing: Promogran Matrix 4.34 (in) 1 x Per Week/15 Days ary Discharge Instructions: Moisten w/normal saline or sterile water; Cover wound as directed. Do not remove from wound bed. Secondary Dressing: Zetuvit Plus 4x4 (in/in) 1 x Per Week/15 Days Com pression Wrap: Urgo K2, two layer compression system, large 1 x Per Week/15 Days 1. I would recommend that we have the patient continue to monitor for any signs  of infection or worsening. Based on what I am seeing I do believe that he is doing decently well here with regard to the collagen I would recommend we continue with that. 2. I would recommend a Zetuvit to cover followed by the Urgo K2 compression wrap. Were 3  and also can recommend continued and appropriate offloading I think this is still good to be of utmost concern. We will see patient back for reevaluation in 1 week here in the clinic. If anything worsens or changes patient will contact our office for additional recommendations. Electronic Signature(s) Signed: 04/01/2023 10:07:50 AM By: Allen Derry PA-C Entered By: Allen Derry on 04/01/2023 10:07:49 Palka, [name] ([id number]) [id number] Page 7 of 7 -------------------------------------------------------------------------------- SuperBill Details Patient Name: Date of Service: [name], [name] 04/01/2023 Medical Record Number: [id number] Patient Account Number: [id number] Date of Birth/Sex: Treating RN: [birth year] (42 y.o. Roel Cluck Primary Care Provider: [name] Other Clinician: Betha Loa Referring Provider: Treating Provider/Extender: Robbie Louis in Treatment: 1 Diagnosis Coding ICD-10 Codes Code Description [phone] Chronic venous hypertension (idiopathic) with ulcer and inflammation of left lower extremity L97.222 Non-pressure chronic ulcer of left calf with fat layer exposed E11.622 Type 2 diabetes mellitus with other skin ulcer L88 Pyoderma gangrenosum F17.208 Nicotine dependence, unspecified, with other nicotine-induced disorders Facility Procedures : CPT4 Code: [id number] Description: (Facility Use Only) [phone] - APPLY MULTLAY COMPRS LWR LT LEG Modifier: Quantity: 1 Physician Procedures : CPT4 Code Description Modifier [id number] - WC PHYS LEVEL 3 - EST PT ICD-10 Diagnosis Description I87.332 Chronic venous hypertension (idiopathic) with ulcer and inflammation of left lower extremity L97.222 Non-pressure chronic ulcer of left calf  with fat layer exposed E11.622 Type 2 diabetes mellitus with other skin ulcer L88 Pyoderma gangrenosum Quantity: 1 Electronic Signature(s) Signed: 04/01/2023 10:08:02 AM By:  Allen Derry PA-C Entered By: Allen Derry on 04/01/2023 10:08:02

## 2023-04-02 NOTE — Progress Notes (Signed)
[name], [name] ([id number]) [id number] Page 1 of 9 Visit Report for 04/01/2023 Arrival Information Details Patient Name: Date of Service: [place], [name] [name] 04/01/2023 8:15 A M Medical Record Number: [id number] Patient Account Number: [id number] Date of Birth/Sex: Treating RN: 1981/02/25 (42 y.o. [name] Primary Care Maricella Filyaw: [name] Other Clinician: Betha Loa Referring Eniya Cannady: Treating Garrie Woodin/Extender: Robbie Louis in Treatment: 1 Visit Information History Since Last Visit All ordered tests and consults were completed: No Patient Arrived: Ambulatory Added or deleted any medications: No Arrival Time: 08:08 Any new allergies or adverse reactions: No Transfer Assistance: None Had a fall or experienced change in No Patient Identification Verified: Yes activities of daily living that may affect Secondary Verification Process Completed: Yes risk of falls: Patient Requires Transmission-Based Precautions: No Signs or symptoms of abuse/neglect since last visito No Patient Has Alerts: No Hospitalized since last visit: No Implantable device outside of the clinic excluding No cellular tissue based products placed in the center since last visit: Has Dressing in Place as Prescribed: Yes Has Compression in Place as Prescribed: Yes Pain Present Now: No Electronic Signature(s) Signed: 04/01/2023 5:03:47 PM By: Betha Loa Entered By: Betha Loa on 04/01/2023 05:19:32 -------------------------------------------------------------------------------- Clinic Level of Care Assessment Details Patient Name: Date of Service: [name], [name] 04/01/2023 8:15 A M Medical Record Number: [id number] Patient Account Number: [id number] Date of Birth/Sex: Treating RN: 26-Apr-1981 (42 y.o. [name] Primary Care Evelette Hollern: [name] Other Clinician: Betha Loa Referring Shonia Skilling: Treating Janoah Menna/Extender: Robbie Louis in Treatment: 1 Clinic Level of Care Assessment Items TOOL 1 Quantity Score []  - 0 Use when EandM and Procedure is performed on INITIAL visit ASSESSMENTS - Nursing Assessment / Reassessment []  - 0 General Physical Exam (combine w/ comprehensive assessment (listed just below) when performed on new pt. [address]KEONTAE, EBSEN ([id number]) [id number] Page 2 of 9 []  - 0 Comprehensive Assessment (HX, ROS, Risk Assessments, Wounds Hx, etc.) ASSESSMENTS - Wound and Skin Assessment / Reassessment []  - 0 Dermatologic / Skin Assessment (not related to wound area) ASSESSMENTS - Ostomy and/or Continence Assessment and Care []  - 0 Incontinence Assessment and Management []  - 0 Ostomy Care Assessment and Management (repouching, etc.) PROCESS - Coordination of Care []  - 0 Simple Patient / Family Education for ongoing care []  - 0 Complex (extensive) Patient / Family Education for ongoing care []  - 0 Staff obtains Chiropractor, Records, T Results / Process Orders est []  - 0 Staff telephones HHA, Nursing Homes / Clarify orders / etc []  - 0 Routine Transfer to another Facility (non-emergent condition) []  - 0 Routine Hospital Admission (non-emergent condition) []  - 0 New Admissions / Manufacturing engineer / Ordering NPWT Apligraf, etc. , []  - 0 Emergency Hospital Admission (emergent condition) PROCESS - Special Needs []  - 0 Pediatric / Minor Patient Management []  - 0 Isolation Patient Management []  - 0 Hearing / Language / Visual special needs []  - 0 Assessment of Community assistance (transportation, D/C planning, etc.) []  - 0 Additional assistance / Altered mentation []  - 0 Support Surface(s) Assessment (bed, cushion, seat, etc.) INTERVENTIONS - Miscellaneous []  - 0 External ear exam []  - 0 Patient Transfer (multiple staff / Nurse, adult / Similar devices) []  - 0 Simple Staple / Suture removal (25 or less) []  - 0 Complex Staple  / Suture removal (26 or more) []  - 0 Hypo/Hyperglycemic Management (do not check if billed separately) []  - 0 Ankle / Brachial Index (ABI) - do not check  if billed separately Has the patient been seen at the hospital within the last three years: Yes Total Score: 0 Level Of Care: ____ Electronic Signature(s) Signed: 04/01/2023 5:03:47 PM By: Betha Loa Entered By: Betha Loa on 04/01/2023 05:48:00 -------------------------------------------------------------------------------- Compression Therapy Details Patient Name: Date of Service: [name], [name] 04/01/2023 8:15 A M Medical Record Number: [id number] Patient Account Number: [id number] Date of Birth/Sex: Treating RN: 1981/05/02 (42 y.o. [name] Primary Care Felix Meras: [name] Other Clinician: Betha Loa [place], [name] ([id number]) [id number] Page 3 of 9 Referring Ivalee Strauser: Treating Cherly Erno/Extender: Robbie Louis in Treatment: 1 Compression Therapy Performed for Wound Assessment: Wound #4 Left,Lateral Lower Leg Performed By: Farrel Gordon, Angie, Compression Type: Double Layer Pre Treatment ABI: 0.9 Post Procedure Diagnosis Same as Pre-procedure Electronic Signature(s) Signed: 04/01/2023 5:03:47 PM By: Betha Loa Entered By: Betha Loa on 04/01/2023 05:47:35 -------------------------------------------------------------------------------- Encounter Discharge Information Details Patient Name: Date of Service: [name], [name] 04/01/2023 8:15 A M Medical Record Number: [id number] Patient Account Number: [id number] Date of Birth/Sex: Treating RN: 01/16/1981 (42 y.o. [name] Primary Care Nakaya Mishkin: [name] Other Clinician: Betha Loa Referring Calia Napp: Treating Marcio Hoque/Extender: Robbie Louis in Treatment: 1 Encounter Discharge Information Items Discharge Condition: Stable Ambulatory Status:  Ambulatory Discharge Destination: Home Transportation: Private Auto Accompanied By: self Schedule Follow-up Appointment: Yes Clinical Summary of Care: Electronic Signature(s) Signed: 04/01/2023 5:03:47 PM By: Betha Loa Entered By: Betha Loa on 04/01/2023 05:53:08 -------------------------------------------------------------------------------- Lower Extremity Assessment Details Patient Name: Date of Service: [name], [name] 04/01/2023 8:15 A M Medical Record Number: [id number] Patient Account Number: [id number] Date of Birth/Sex: Treating RN: [birth year] (42 y.o. [name] Primary Care Channell Quattrone: [name] Other Clinician: Betha Loa Referring Mihailo Sage: Treating Machai Desmith/Extender: Robbie Louis in Treatment: 1 [place], [place] ([id number]) [id number] Page 4 of 9 Edema Assessment Assessed: [Left: Yes] [Right: No] Edema: [Left: Ye] [Right: s] Calf Left: Right: Point of Measurement: From Medial Instep 47 cm Ankle Left: Right: Point of Measurement: From Medial Instep 28.7 cm Vascular Assessment Pulses: Dorsalis Pedis Palpable: [Left:Yes] Extremity colors, hair growth, and conditions: Extremity Color: [Left:Normal] Hair Growth on Extremity: [Left:Yes] Temperature of Extremity: [Left:Warm < 3 seconds] Toe Nail Assessment Left: Right: Thick: No Discolored: No Deformed: No Improper Length and Hygiene: No Electronic Signature(s) Signed: 04/01/2023 4:52:48 PM By: Midge Aver MSN RN CNS WTA Signed: 04/01/2023 5:03:47 PM By: Betha Loa Entered By: Betha Loa on 04/01/2023 05:33:24 -------------------------------------------------------------------------------- Multi Wound Chart Details Patient Name: Date of Service: [name], [name] 04/01/2023 8:15 A M Medical Record Number: [id number] Patient Account Number: [id number] Date of Birth/Sex: Treating RN: 04-01-1981 (42 y.o. [name] Primary Care  Sean Malinowski: [name] Other Clinician: Betha Loa Referring Cathi Hazan: Treating Lovette Merta/Extender: Robbie Louis in Treatment: 1 Vital Signs Height(in): 70 Pulse(bpm): 90 Weight(lbs): 329 Blood Pressure(mmHg): 118/79 Body Mass Index(BMI): 47.2 Temperature(F): 98.2 Respiratory Rate(breaths/min): 18 [4:Photos:] [N/A:N/A] Left, Lateral Lower Leg N/A N/A Wound Location: Gradually Appeared N/A N/A Wounding Event: Pyoderma N/A N/A Primary Etiology: Sleep Apnea, Hypertension, Colitis, N/A N/A Comorbid History: Type II Diabetes 03/13/2023 N/A N/A Date Acquired: 1 N/A N/A Weeks of Treatment: Open N/A N/A Wound Status: No N/A N/A Wound Recurrence: Yes N/A N/A Clustered Wound: 2 N/A N/A Clustered Quantity: 1.3x1x0.2 N/A N/A Measurements L x W x D (cm) 1.021 N/A N/A A (cm) : rea 0.204 N/A N/A Volume (cm) : 41.20% N/A N/A % Reduction in Area: 41.20% N/A N/A %  Reduction in Volume: Full Thickness Without Exposed N/A N/A Classification: Support Structures Medium N/A N/A Exudate A mount: Serosanguineous N/A N/A Exudate Type: red, brown N/A N/A Exudate Color: Small (1-33%) N/A N/A Granulation A mount: Red, Pink N/A N/A Granulation Quality: Large (67-100%) N/A N/A Necrotic A mount: Fat Layer (Subcutaneous Tissue): Yes N/A N/A Exposed Structures: None N/A N/A Epithelialization: Treatment Notes Electronic Signature(s) Signed: 04/01/2023 5:03:47 PM By: Betha Loa Entered By: Betha Loa on 04/01/2023 05:34:04 -------------------------------------------------------------------------------- Multi-Disciplinary Care Plan Details Patient Name: Date of Service: [name], [name] 04/01/2023 8:15 A M Medical Record Number: [id number] Patient Account Number: [id number] Date of Birth/Sex: Treating RN: May 05, 1981 (42 y.o. [name] Primary Care Edina Winningham: [name] Other Clinician: Betha Loa Referring Emon Miggins: Treating  Mana Morison/Extender: Robbie Louis in Treatment: 1 Active Inactive Wound/Skin Impairment Nursing Diagnoses: Impaired tissue integrity Knowledge deficit related to ulceration/compromised skin integrity Goals: Ulcer/skin breakdown will have a volume reduction of 30% by week 4 Date Initiated: 03/25/2023 Target Resolution Date: 04/22/2023 Goal Status: Active Ulcer/skin breakdown will have a volume reduction of 50% by week 8 Date Initiated: 03/25/2023 Target Resolution Date: 05/20/2023 Goal Status: Active HARLEN, TAIT ([id number]) [id number] Page 6 of 9 Ulcer/skin breakdown will have a volume reduction of 80% by week 12 Date Initiated: 03/25/2023 Target Resolution Date: 06/17/2023 Goal Status: Active Ulcer/skin breakdown will heal within 14 weeks Date Initiated: 03/25/2023 Target Resolution Date: 07/01/2023 Goal Status: Active Interventions: Assess patient/caregiver ability to obtain necessary supplies Assess patient/caregiver ability to perform ulcer/skin care regimen upon admission and as needed Assess ulceration(s) every visit Provide education on ulcer and skin care Notes: Electronic Signature(s) Signed: 04/01/2023 4:52:48 PM By: Midge Aver MSN RN CNS WTA Signed: 04/01/2023 5:03:47 PM By: Betha Loa Entered By: Betha Loa on 04/01/2023 05:48:19 -------------------------------------------------------------------------------- Pain Assessment Details Patient Name: Date of Service: [name], [name] 04/01/2023 8:15 A M Medical Record Number: [id number] Patient Account Number: [id number] Date of Birth/Sex: Treating RN: 01/03/1981 (42 y.o. [name] Primary Care Graycen Sadlon: [name] Other Clinician: Betha Loa Referring Celestine Prim: Treating Indria Bishara/Extender: Robbie Louis in Treatment: 1 Active Problems Location of Pain Severity and Description of Pain Patient Has Paino No Site Locations Pain  Management and Medication Current Pain Management: Electronic Signature(s) Signed: 04/01/2023 4:52:48 PM By: Midge Aver MSN RN CNS WTA Signed: 04/01/2023 5:03:47 PM By: Betha Loa Entered By: Betha Loa on 04/01/2023 05:25:19 [place], [name] ([id number]) [id number] Page 7 of 9 -------------------------------------------------------------------------------- Patient/Caregiver Education Details Patient Name: Date of Service: [place], [name] [name] 11/12/2024andnbsp8:15 A M Medical Record Number: [id number] Patient Account Number: [id number] Date of Birth/Gender: Treating RN: 08/25/1980 (42 y.o. [name] Primary Care Physician: [name] Other Clinician: Betha Loa Referring Physician: Treating Physician/Extender: Robbie Louis in Treatment: 1 Education Assessment Education Provided To: Patient Education Topics Provided Wound/Skin Impairment: Handouts: Other: continue wound care as directed Methods: Explain/Verbal Responses: State content correctly Electronic Signature(s) Signed: 04/01/2023 5:03:47 PM By: Betha Loa Entered By: Betha Loa on 04/01/2023 05:52:11 -------------------------------------------------------------------------------- Wound Assessment Details Patient Name: Date of Service: [name], [name] 04/01/2023 8:15 A M Medical Record Number: [id number] Patient Account Number: [id number] Date of Birth/Sex: Treating RN: 1980-07-20 (42 y.o. [name] Primary Care Ermon Sagan: [name] Other Clinician: Betha Loa Referring Wilson Dusenbery: Treating Denvil Canning/Extender: Robbie Louis in Treatment: 1 Wound Status Wound Number: 4 Primary Etiology: Pyoderma Wound Location: Left, Lateral Lower Leg Wound Status: Open Wounding Event: Gradually Appeared Comorbid History: Sleep  Apnea, Hypertension, Colitis, Type II Diabetes Date Acquired: 03/13/2023 Weeks Of Treatment:  1 Clustered Wound: Yes Photos [place], [name] ([id number]) [id number] Page 8 of 9 Wound Measurements Length: (cm) 1.3 Width: (cm) 1 Depth: (cm) 0.2 Clustered Quantity: 2 Area: (cm) 1.021 Volume: (cm) 0.204 % Reduction in Area: 41.2% % Reduction in Volume: 41.2% Epithelialization: None Wound Description Classification: Full Thickness Without Exposed Supp Exudate Amount: Medium Exudate Type: Serosanguineous Exudate Color: red, brown ort Structures Foul Odor After Cleansing: No Slough/Fibrino Yes Wound Bed Granulation Amount: Small (1-33%) Exposed Structure Granulation Quality: Red, Pink Fat Layer (Subcutaneous Tissue) Exposed: Yes Necrotic Amount: Large (67-100%) Necrotic Quality: Adherent Slough Treatment Notes Wound #4 (Lower Leg) Wound Laterality: Left, Lateral Cleanser Soap and Water Discharge Instruction: Gently cleanse wound with antibacterial soap, rinse and pat dry prior to dressing wounds Wound Cleanser Discharge Instruction: Wash your hands with soap and water. Remove old dressing, discard into plastic bag and place into trash. Cleanse the wound with Wound Cleanser prior to applying a clean dressing using gauze sponges, not tissues or cotton balls. Do not scrub or use excessive force. Pat dry using gauze sponges, not tissue or cotton balls. Peri-Wound Care Topical Clobetasol Propionate ointment 0.05%, 60 (g) tube Discharge Instruction: apply to periwound Mupirocin Ointment Discharge Instruction: Apply over collagen on wound bed Primary Dressing Promogran Matrix 4.34 (in) Discharge Instruction: Moisten w/normal saline or sterile water; Cover wound as directed. Do not remove from wound bed. Secondary Dressing Zetuvit Plus 4x4 (in/in) Secured With Compression Wrap Urgo K2, two layer compression system, large Compression Stockings Add-Ons Electronic Signature(s) Signed: 04/01/2023 4:52:48 PM By: Midge Aver MSN RN CNS WTA Signed:  04/01/2023 5:03:47 PM By: Betha Loa Entered By: Betha Loa on 04/01/2023 05:30:23 [place], [name] ([id number]) [id number] Page 9 of 9 -------------------------------------------------------------------------------- Vitals Details Patient Name: Date of Service: [name], [name] [name] 04/01/2023 8:15 A M Medical Record Number: [id number] Patient Account Number: [id number] Date of Birth/Sex: Treating RN: [birth year] (42 y.o. [name] Primary Care Siren Porrata: [name] Other Clinician: Betha Loa Referring Amera Banos: Treating [place] Nie/Extender: Robbie Louis in Treatment: 1 Vital Signs Time Taken: 08:20 Temperature (F): 98.2 Height (in): 70 Pulse (bpm): 90 Weight (lbs): 329 Respiratory Rate (breaths/min): 18 Body Mass Index (BMI): 47.2 Blood Pressure (mmHg): 118/79 Reference Range: 80 - 120 mg / dl Electronic Signature(s) Signed: 04/01/2023 5:03:47 PM By: Betha Loa Entered By: Betha Loa on 04/01/2023 05:25:08

## 2023-04-08 ENCOUNTER — Encounter: Payer: 59 | Admitting: Physician Assistant

## 2023-04-08 DIAGNOSIS — I87332 Chronic venous hypertension (idiopathic) with ulcer and inflammation of left lower extremity: Secondary | ICD-10-CM | POA: Diagnosis not present

## 2023-04-08 NOTE — Progress Notes (Addendum)
KINTE, GRIEVES ([id number]) [id number] Page 1 of 7 Visit Report for 04/08/2023 Chief Complaint Document Details Patient Name: Date of Service: [place], [name] 04/08/2023 7:30 A M Medical Record Number: [id number] Patient Account Number: [id number] Date of Birth/Sex: Treating RN: 1981-03-19 (42 y.o. [name] Primary Care Provider: [name] Other Clinician: Referring Provider: Treating Provider/Extender: Robbie Louis in Treatment: 2 Information Obtained from: Patient Chief Complaint LLE pyoderma ulcer Electronic Signature(s) Signed: 04/08/2023 7:52:59 AM By: Allen Derry PA-C Entered By: Allen Derry on 04/08/2023 04:52:59 -------------------------------------------------------------------------------- HPI Details Patient Name: Date of Service: [name], [name] 04/08/2023 7:30 A M Medical Record Number: [id number] Patient Account Number: [id number] Date of Birth/Sex: Treating RN: [birth year] (42 y.o. [name] Primary Care Provider: [name] Other Clinician: Referring Provider: Treating Provider/Extender: Robbie Louis in Treatment: 2 History of Present Illness HPI Description: 12/04/16; 42 year old man who comes into the clinic today for review of a wound on the posterior left calf. He tells me that is been there for about a year. He is not a diabetic he does smoke half a pack per day. He was seen in the ER on 11/20/16 felt to have cellulitis around the wound and was given clindamycin. An x-ray did not show osteomyelitis. The patient initially tells me that he has a milk allergy that sets off a pruritic itching rash on his lower legs which she scratches incessantly and he thinks that's what may have set up the wound. He has been using various topical antibiotics and ointments without any effect. He works in a [employer]ing [employer] and is on his feet all day. He does not have a prior history of  wounds however he does have the rash on both lower legs the right arm and the ventral aspect of his left arm. These are excoriations and clearly have had scratching however there are of macular looking areas on both legs including a substantial larger area on the right leg. This does not have an underlying open area. There is no blistering. 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 He seemed to recognize this. On further questioning he admits to a 5 year history of quiesced. ulcerative colitis. He is not in any treatment for this. He's had no recent travel 04/02/17; the patient was seen last week by Dr. Carles Collet at [facility] dermatology locally who kindly saw him at my request. 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 My concern was the degree of erythema medially extending from his left leg wound which was either inflammation from pyoderma or cellulitis. I put him on Augmentin however culture of the wound showed Pseudomonas which is quinolone sensitive. I really don't believe he has cellulitis however in view of everything I will continue and give him a course of Cipro. He is also on doxycycline as an immune modulator for the pyoderma. KEILIN, NASH ([id number]) [id number] Page 2 of 7 In addition to his original wound on the left lateral leg with surrounding erythema he has a wound on the right posterior calf which was an original biopsy site done by dermatology. This was felt to represent pathergy from pyoderma gangrenosum Readmission: 03-25-2023 patient presents today for readmission here in the clinic concerning issues he  is having with a wound in the same area that we previously took care of for him. He tells me that around 24  October he had an area that opened and he just has not been able to get the close he thought it might be best to come back here as opposed to try to do it himself and letting it get worse. He continues with the Remicade infusions every 6 weeks that really has not changed. His past medical history also has not changed significantly from what I saw him last. He was discharged October 2023. 04-01-2023 upon evaluation today patient appears to be doing well currently in regard to his wound there is no signs of infection I think that the film and biofilm on the surface of the wound went right off there was no need for sharp debridement with this being pyoderma he does not allow for debridement anyways. Fortunately I do think that he is making really good progress here towards closure however and I feel like that the patient is doing quite well already and its only been 1 week. The wrap did start to slide on Sunday he took it off and use his own compression he did a very good job keeping this under control his leg is definitely smaller this week compared to last week. 04-08-2023 upon evaluation today patient appears to be doing excellent in regard to his wound he is already showing signs of new granulation growth which is great news and I am very pleased in that regard. Fortunately I do not see any signs of active infection at this time. No fevers, chills, nausea, vomiting, or diarrhea. Electronic Signature(s) Signed: 04/08/2023 8:26:54 AM By: Allen Derry PA-C Entered By: Allen Derry on 04/08/2023 05:26:54 -------------------------------------------------------------------------------- Physical Exam Details Patient Name: Date of Service: [name], [name] 04/08/2023 7:30 A M Medical Record Number: [id number] Patient Account Number: [id number] Date of Birth/Sex: Treating RN: 06/17/1980 (42 y.o. [name] Primary Care Provider: [name] Other Clinician: Referring Provider: Treating  Provider/Extender: Robbie Louis in Treatment: 2 Constitutional Obese and well-hydrated in no acute distress. Respiratory normal breathing without difficulty. Psychiatric this patient is able to make decisions and demonstrates good insight into disease process. Alert and Oriented x 3. pleasant and cooperative. Notes Upon inspection patient's wound bed actually showed signs of good granulation and epithelization at this point. Fortunately I do not see any signs of worsening overall and I do believe that the patient is making pretty good headway here towards closure which is great news. I think that we are on the right track. Electronic Signature(s) Signed: 04/08/2023 8:27:23 AM By: Allen Derry PA-C Entered By: Allen Derry on 04/08/2023 05:27:23 [name] ([id number]) [id number] Page 3 of 7 -------------------------------------------------------------------------------- Physician Orders Details Patient Name: Date of Service: [name], [name] 04/08/2023 7:30 A M Medical Record Number: [id number] Patient Account Number: [id number] Date of Birth/Sex: Treating RN: 12-15-1980 (42 y.o. [name] Primary Care Provider: [name] Other Clinician: Referring Provider: Treating Provider/Extender: Robbie Louis in Treatment: 2 The following information was scribed by: Angelina Pih The information was scribed for: Allen Derry Verbal / Phone Orders: No Diagnosis Coding ICD-10 Coding Code Description [phone] Chronic venous hypertension (idiopathic) with ulcer and inflammation of left lower extremity L97.222 Non-pressure chronic ulcer of left calf with fat layer exposed E11.622 Type 2 diabetes mellitus with other skin ulcer L88 Pyoderma gangrenosum F17.208 Nicotine dependence, unspecified, with other nicotine-induced disorders Follow-up Appointments Return Appointment  in 1 week. Bathing/ Shower/  Hygiene May shower with wound dressing protected with water repellent cover or cast protector. No tub bath. Anesthetic (Use 'Patient Medications' Section for Anesthetic Order Entry) Lidocaine applied to wound bed Edema Control - Orders / Instructions Elevate, Exercise Daily and A void Standing for Long Periods of Time. Elevate leg(s) parallel to the floor when sitting. DO YOUR BEST to sleep in the bed at night. DO NOT sleep in your recliner. Long hours of sitting in a recliner leads to swelling of the legs and/or potential wounds on your backside. Non-Wound Condition dditional non-wound orders/instructions: - nystatin cream to left foot/toes for irritation A Wound Treatment Wound #4 - Lower Leg Wound Laterality: Left, Lateral Cleanser: Soap and Water 1 x Per Week/15 Days Discharge Instructions: Gently cleanse wound with antibacterial soap, rinse and pat dry prior to dressing wounds Cleanser: Wound Cleanser 1 x Per Week/15 Days Discharge Instructions: Wash your hands with soap and water. Remove old dressing, discard into plastic bag and place into trash. Cleanse the wound with Wound Cleanser prior to applying a clean dressing using gauze sponges, not tissues or cotton balls. Do not scrub or use excessive force. Pat dry using gauze sponges, not tissue or cotton balls. Peri-Wound Care: Triamcinolone Acetonide Cream, 0.1%, 15 (g) tube 1 x Per Week/15 Days Discharge Instructions: to leg for itching Apply as directed. Topical: Clobetasol Propionate ointment 0.05%, 60 (g) tube 1 x Per Week/15 Days Discharge Instructions: apply to periwound Prim Dressing: Promogran Matrix 4.34 (in) 1 x Per Week/15 Days ary Discharge Instructions: Moisten w/normal saline or sterile water; Cover wound as directed. Do not remove from wound bed. Secondary Dressing: Zetuvit Plus 4x4 (in/in) 1 x Per Week/15 Days Compression Wrap: Urgo K2, two layer compression system, large 1 x Per Week/15 Days Mcdonald, Yojan  ([id number]) [id number] Page 4 of 7 Electronic Signature(s) Signed: 04/08/2023 3:56:38 PM By: Angelina Pih Signed: 04/08/2023 4:00:09 PM By: Allen Derry PA-C Entered By: Angelina Pih on 04/08/2023 05:12:44 -------------------------------------------------------------------------------- Problem List Details Patient Name: Date of Service: [name], [name] 04/08/2023 7:30 A M Medical Record Number: [id number] Patient Account Number: [id number] Date of Birth/Sex: Treating RN: 01-21-1981 (42 y.o. [name] Primary Care Provider: [name] Other Clinician: Referring Provider: Treating Provider/Extender: Robbie Louis in Treatment: 2 Active Problems ICD-10 Encounter Code Description Active Date MDM Diagnosis I87.332 Chronic venous hypertension (idiopathic) with ulcer and inflammation of left 03/25/2023 No Yes lower extremity L97.222 Non-pressure chronic ulcer of left calf with fat layer exposed 03/25/2023 No Yes E11.622 Type 2 diabetes mellitus with other skin ulcer 03/25/2023 No Yes L88 Pyoderma gangrenosum 03/25/2023 No Yes F17.208 Nicotine dependence, unspecified, with other nicotine-induced disorders 03/25/2023 No Yes Inactive Problems Resolved Problems Electronic Signature(s) Signed: 04/08/2023 7:52:55 AM By: Allen Derry PA-C Entered By: Allen Derry on 04/08/2023 04:52:55 Dumm, [name] ([id number]) [id number] Page 5 of 7 -------------------------------------------------------------------------------- Progress Note Details Patient Name: Date of Service[place], [name] 04/08/2023 7:30 A M Medical Record Number: [id number] Patient Account Number: [id number] Date of Birth/Sex: Treating RN: Feb 07, 1981 (42 y.o. [name] Primary Care Provider: [name] Other Clinician: Referring Provider: Treating Provider/Extender: Robbie Louis in Treatment:  2 Subjective Chief Complaint Information obtained from Patient LLE pyoderma ulcer History of Present Illness (HPI) 12/04/16; 42 year old man who comes into the clinic today for review of a wound on the posterior left calf. He tells me that is been there for about a year. He is not a  diabetic he does smoke half a pack per day. He was seen in the ER on 11/20/16 felt to have cellulitis around the wound and was given clindamycin. An x- ray did not show osteomyelitis. The patient initially tells me that he has a milk allergy that sets off a pruritic itching rash on his lower legs which she scratches incessantly and he thinks that's what may have set up the wound. He has been using various topical antibiotics and ointments without any effect. He works in a [employer]ing [employer] and is on his feet all day. He does not have a prior history of wounds however he does have the rash on both lower legs the right arm and the ventral aspect of his left arm. These are excoriations and clearly have had scratching however there are of macular looking areas on both legs including a substantial larger area on the right leg. This does not have an underlying open area. There is no blistering. 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 He seemed to recognize this. On further questioning he admits to a 5 year history of quiesced. ulcerative colitis. He is not in any treatment for this. He's had no recent travel 04/02/17; the patient was seen last week by Dr. Carles Collet at [facility] dermatology locally who kindly saw him at my request. 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 My concern was the degree of erythema medially extending from his left leg wound which was either inflammation from pyoderma or  cellulitis. I put him on Augmentin however culture of the wound showed Pseudomonas which is quinolone sensitive. I really don't believe he has cellulitis however in view of everything I will continue and give him a course of Cipro. He is also on doxycycline as an immune modulator for the pyoderma. In addition to his original wound on the left lateral leg with surrounding erythema he has a wound on the right posterior calf which was an original biopsy site done by dermatology. This was felt to represent pathergy from pyoderma gangrenosum Readmission: 03-25-2023 patient presents today for readmission here in the clinic concerning issues he is having with a wound in the same area that we previously took care of for him. He tells me that around 24 October he had an area that opened and he just has not been able to get the close he thought it might be best to come back here as opposed to try to do it himself and letting it get worse. He continues with the Remicade infusions every 6 weeks that really has not changed. His past medical history also has not changed significantly from what I saw him last. He was discharged October 2023. 04-01-2023 upon evaluation today patient appears to be doing well currently in regard to his wound there is no signs of infection I think that the film and biofilm on the surface of the wound went right off there was no need for sharp debridement with this being pyoderma he does not allow for debridement anyways. Fortunately I do think that he is making really good progress here towards closure however and I feel like that the patient is doing quite well already and its only been 1 week. The wrap did start to slide on  Sunday he took it off and use his own compression he did a very good job keeping this under control his leg is definitely smaller this week compared to last week. 04-08-2023 upon evaluation today patient appears to be doing excellent in regard to his wound he is  already showing signs of new granulation growth which is great news and I am very pleased in that regard. Fortunately I do not see any signs of active infection at this time. No fevers, chills, nausea, vomiting, or diarrhea. Objective Constitutional Obese and well-hydrated in no acute distress. Vitals Time Taken: 7:35 AM, Height: 70 in, Weight: 329 lbs, BMI: 47.2, Temperature: 97.7 F, Pulse: 85 bpm, Respiratory Rate: 18 breaths/min, Blood Pressure: 131/84 mmHg. Respiratory Sugg, Jacorion ([id number]) [id number] Page 6 of 7 normal breathing without difficulty. Psychiatric this patient is able to make decisions and demonstrates good insight into disease process. Alert and Oriented x 3. pleasant and cooperative. General Notes: Upon inspection patient's wound bed actually showed signs of good granulation and epithelization at this point. Fortunately I do not see any signs of worsening overall and I do believe that the patient is making pretty good headway here towards closure which is great news. I think that we are on the right track. Integumentary (Hair, Skin) Wound #4 status is Open. Original cause of wound was Gradually Appeared. The date acquired was: 03/13/2023. The wound has been in treatment 2 weeks. The wound is located on the Left,Lateral Lower Leg. The wound measures 1.9cm length x 1.1cm width x 0.1cm depth; 1.641cm^2 area and 0.164cm^3 volume. There is Fat Layer (Subcutaneous Tissue) exposed. There is no tunneling or undermining noted. There is a medium amount of serosanguineous drainage noted. There is small (1-33%) red, pink granulation within the wound bed. There is a large (67-100%) amount of necrotic tissue within the wound bed including Adherent Slough. 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 There was a Double Layer Compression Therapy Procedure by Angelina Pih, RN. Post procedure Diagnosis Wound #4: Same as Pre-Procedure Plan Follow-up Appointments: Return Appointment in 1 week. Bathing/ Shower/ Hygiene: May shower with wound dressing protected with water repellent cover or cast protector. No tub bath. Anesthetic (Use 'Patient Medications' Section for Anesthetic Order Entry): Lidocaine applied to wound bed Edema Control - Orders / Instructions: Elevate, Exercise Daily and Avoid Standing for Long Periods of Time. Elevate leg(s) parallel to the floor when sitting. DO YOUR BEST to sleep in the bed at night. DO NOT sleep in your recliner. Long hours of sitting in a recliner leads to swelling of the legs and/or potential wounds on your backside. Non-Wound Condition: Additional non-wound orders/instructions: - nystatin cream to left foot/toes for irritation WOUND #4: - Lower Leg Wound Laterality: Left, Lateral Cleanser: Soap and Water 1 x Per Week/15 Days Discharge Instructions: Gently cleanse wound with antibacterial soap, rinse and pat dry prior to dressing wounds Cleanser: Wound Cleanser 1 x Per Week/15 Days Discharge Instructions: Wash your hands with soap and water. Remove old dressing, discard into plastic bag and place into trash. Cleanse the wound with Wound Cleanser prior to applying a clean dressing using gauze sponges, not tissues or cotton balls. Do not scrub  or use excessive force. Pat dry using gauze sponges, not tissue or cotton balls. Peri-Wound Care: Triamcinolone Acetonide Cream, 0.1%, 15 (g) tube 1 x Per Week/15 Days Discharge Instructions: to leg for itching Apply as directed. Topical: Clobetasol Propionate ointment  0.05%, 60 (g) tube 1 x Per Week/15 Days Discharge Instructions: apply to periwound Prim Dressing: Promogran Matrix 4.34 (in) 1 x Per Week/15 Days ary Discharge Instructions: Moisten w/normal saline or sterile water; Cover wound as directed. Do not remove from wound bed. Secondary Dressing: Zetuvit Plus 4x4 (in/in) 1 x Per Week/15 Days Com pression Wrap: Urgo K2, two layer compression system, large 1 x Per Week/15 Days 1. I would recommend that we have the patient going continue to monitor for any signs of infection or worsening. Based on what I see I do believe that we are doing quite well here. 2. I am going to recommend as well that we should specifically continue with the collagen followed by the clobetasol around the edges of the wound and then we can use triamcinolone to the leg in general and some nystatin cream on his toes. He was some over-the-counter athlete's foot for treatment going forward. We will see patient back for reevaluation in 1 week here in the clinic. If anything worsens or changes patient will contact our office for additional SHAYMUS, PEPPLE ([id number]) [id number] Page 7 of 7 recommendations. Electronic Signature(s) Signed: 04/08/2023 8:27:50 AM By: Allen Derry PA-C Entered By: Allen Derry on 04/08/2023 05:27:50 -------------------------------------------------------------------------------- SuperBill Details Patient Name: Date of Service: [name], [name] 04/08/2023 Medical Record Number: [id number] Patient Account Number: [id number] Date of Birth/Sex: Treating RN: 1980-10-16 (42 y.o. [name] Primary Care Provider: [name] Other Clinician: Referring Provider: Treating Provider/Extender: Robbie Louis in Treatment: 2 Diagnosis Coding ICD-10 Codes Code Description [phone] Chronic venous hypertension (idiopathic) with ulcer and inflammation of left lower extremity L97.222 Non-pressure chronic ulcer  of left calf with fat layer exposed E11.622 Type 2 diabetes mellitus with other skin ulcer L88 Pyoderma gangrenosum F17.208 Nicotine dependence, unspecified, with other nicotine-induced disorders Facility Procedures : CPT4 Code: [id number] Description: (Facility Use Only) [phone] - APPLY MULTLAY COMPRS LWR LT LEG Modifier: Quantity: 1 Physician Procedures : CPT4 Code Description Modifier [id number] - WC PHYS LEVEL 3 - EST PT ICD-10 Diagnosis Description I87.332 Chronic venous hypertension (idiopathic) with ulcer and inflammation of left lower extremity L97.222 Non-pressure chronic ulcer of left calf  with fat layer exposed E11.622 Type 2 diabetes mellitus with other skin ulcer L88 Pyoderma gangrenosum Quantity: 1 Electronic Signature(s) Signed: 04/08/2023 8:28:19 AM By: Allen Derry PA-C Entered By: Allen Derry on 04/08/2023 05:28:19

## 2023-04-08 NOTE — Progress Notes (Signed)
[name] ([id number]) [id number] Page 1 of 9 Visit Report for 04/08/2023 Arrival Information Details Patient Name: Date of Service: [name], [name] 04/08/2023 7:30 A M Medical Record Number: [id number] Patient Account Number: [id number] Date of Birth/Sex: Treating RN: [birth year] (42 y.o. [name] Primary Care [name]: [name] Other Clinician: Referring [name]: Treating [name]/Extender: [name] in Treatment: 2 Visit Information History Since Last Visit Added or deleted any medications: No Patient Arrived: Ambulatory Any new allergies or adverse reactions: No Arrival Time: 07:32 Had a fall or experienced change in No Accompanied By: self activities of daily living that may affect Transfer Assistance: None risk of falls: Patient Identification Verified: Yes Hospitalized since last visit: No Secondary Verification Process Completed: Yes Has Dressing in Place as Prescribed: Yes Patient Requires Transmission-Based Precautions: No Has Compression in Place as Prescribed: Yes Patient Has Alerts: No Pain Present Now: No Electronic Signature(s) Signed: 04/08/2023 8:19:10 AM By: [name] Entered By: [name] on 04/08/2023 05:19:10 -------------------------------------------------------------------------------- Clinic Level of Care Assessment Details Patient Name: Date of Service: [name] 04/08/2023 7:30 A M Medical Record Number: [id number] Patient Account Number: [id number] Date of Birth/Sex: Treating RN: 20-Feb-1981 (42 y.o. [name] Primary Care Biff Rutigliano: [name] Other Clinician: Referring [name]: Treating [name]/Extender: [name] in Treatment: 2 Clinic Level of Care Assessment Items TOOL 1 Quantity Score []  - 0 Use when EandM and Procedure is performed on INITIAL visit ASSESSMENTS - Nursing Assessment / Reassessment []  -  0 General Physical Exam (combine w/ comprehensive assessment (listed just below) when performed on new pt. evals) []  - 0 Comprehensive Assessment (HX, ROS, Risk Assessments, Wounds Hx, etc.) ASSESSMENTS - Wound and Skin Assessment / Reassessment []  - 0 Dermatologic / Skin Assessment (not related to wound area) [name], [name] ([id number]) [id number] Page 2 of 9 ASSESSMENTS - Ostomy and/or Continence Assessment and Care []  - 0 Incontinence Assessment and Management []  - 0 Ostomy Care Assessment and Management (repouching, etc.) PROCESS - Coordination of Care []  - 0 Simple Patient / Family Education for ongoing care []  - 0 Complex (extensive) Patient / Family Education for ongoing care []  - 0 Staff obtains Chiropractor, Records, T Results / Process Orders est []  - 0 Staff telephones HHA, Nursing Homes / Clarify orders / etc []  - 0 Routine Transfer to another Facility (non-emergent condition) []  - 0 Routine Hospital Admission (non-emergent condition) []  - 0 New Admissions / Manufacturing engineer / Ordering NPWT Apligraf, etc. , []  - 0 Emergency Hospital Admission (emergent condition) PROCESS - Special Needs []  - 0 Pediatric / Minor Patient Management []  - 0 Isolation Patient Management []  - 0 Hearing / Language / Visual special needs []  - 0 Assessment of Community assistance (transportation, D/C planning, etc.) []  - 0 Additional assistance / Altered mentation []  - 0 Support Surface(s) Assessment (bed, cushion, seat, etc.) INTERVENTIONS - Miscellaneous []  - 0 External ear exam []  - 0 Patient Transfer (multiple staff / Nurse, adult / Similar devices) []  - 0 Simple Staple / Suture removal (25 or less) []  - 0 Complex Staple / Suture removal (26 or more) []  - 0 Hypo/Hyperglycemic Management (do not check if billed separately) []  - 0 Ankle / Brachial Index (ABI) - do not check if billed separately Has the patient been seen at the hospital within  the last three years: Yes Total Score: 0 Level Of Care: ____ Electronic Signature(s) Signed: 04/08/2023 3:56:38 PM By: [name] Entered By: [name]  on 04/08/2023 05:12:51 -------------------------------------------------------------------------------- Compression Therapy Details Patient Name: Date of Service: [name] 04/08/2023 7:30 A M Medical Record Number: [id number] Patient Account Number: [id number] Date of Birth/Sex: Treating RN: [birth year] (42 y.o. [name] Primary Care [name]: [name] Other Clinician: Referring [name]: Treating [name]/Extender: [name] in Treatment: 2 Compression Therapy Performed for Wound Assessment: Wound #4 Left,Lateral Lower Leg Performed By: [name] Bodily, RN Compression TypeThurmond Butts ([id number]) [id number] Page 3 of 9 Post Procedure Diagnosis Same as Pre-procedure Electronic Signature(s) Signed: 04/08/2023 3:56:38 PM By: [name] Entered By: [name] on 04/08/2023 04:57:54 -------------------------------------------------------------------------------- Encounter Discharge Information Details Patient Name: Date of Service: [name], [name] 04/08/2023 7:30 A M Medical Record Number: [id number] Patient Account Number: [id number] Date of Birth/Sex: Treating RN: 1980-11-04 (42 y.o. [name] Primary Care [name]: [name] Other Clinician: Referring [name]: Treating [name]/Extender: [name] in Treatment: 2 Encounter Discharge Information Items Discharge Condition: Stable Ambulatory Status: Ambulatory Discharge Destination: Home Transportation: Private Auto Accompanied By: self Schedule Follow-up Appointment: Yes Clinical Summary of Care: Electronic Signature(s) Signed: 04/08/2023 3:56:38 PM By: [name] Entered By: [name] on 04/08/2023  05:13:47 -------------------------------------------------------------------------------- Lower Extremity Assessment Details Patient Name: Date of Service: [name] 04/08/2023 7:30 A M Medical Record Number: [id number] Patient Account Number: [id number] Date of Birth/Sex: Treating RN: 07-19-1980 (42 y.o. [name] Primary Care Zyra Parrillo: [name] Other Clinician: Referring Georgi Navarrete: Treating Callie Bunyard/Extender: [name] in Treatment: 2 Edema Assessment Assessed: [Left: No] [Right: No] [Left: Edema] [Right: :] [name], [name] ([id number]) [id number] Page 4 of 9 Left: Right: Point of Measurement: 34 cm From Medial Instep 46.5 cm Ankle Left: Right: Point of Measurement: 12 cm From Medial Instep 27.8 cm Vascular Assessment Pulses: Dorsalis Pedis Palpable: [Left:Yes] Extremity colors, hair growth, and conditions: Extremity Color: [Left:Normal] Hair Growth on Extremity: [Left:Yes] Temperature of Extremity: [Left:Warm < 3 seconds] Toe Nail Assessment Left: Right: Thick: No Discolored: No Deformed: No Improper Length and Hygiene: No Electronic Signature(s) Signed: 04/08/2023 3:56:38 PM By: [name] Entered By: [name] on 04/08/2023 04:44:48 -------------------------------------------------------------------------------- Multi Wound Chart Details Patient Name: Date of Service: [name], [name] 04/08/2023 7:30 A M Medical Record Number: [id number] Patient Account Number: [id number] Date of Birth/Sex: Treating RN: 25-Sep-1980 (42 y.o. [name] Primary Care Brehanna Deveny: [name] Other Clinician: Referring See Beharry: Treating [name]/Extender: [name] in Treatment: 2 [4:Photos:] [N/A:N/A] Left, Lateral Lower Leg N/A N/A Wound Location: Gradually Appeared N/A N/A Wounding Event: Pyoderma N/A N/A Primary Etiology: Sleep Apnea, Hypertension, Colitis,  N/A N/A Comorbid History: Type II Diabetes 03/13/2023 N/A N/A Date Acquired: 2 N/A N/A Weeks of Treatment: Open N/A N/A Wound Status: No N/A N/A Wound Recurrence: Yes N/A N/A Clustered Wound: 2 N/A N/A Clustered QuantityIliyan [name], [name] ([id number]) [id number] Page 5 of 9 1.9x1.1x0.1 N/A N/A Measurements L x W x D (cm) 1.641 N/A N/A A (cm) : rea 0.164 N/A N/A Volume (cm) : 5.50% N/A N/A % Reduction in Area: 52.70% N/A N/A % Reduction in Volume: Full Thickness Without Exposed N/A N/A Classification: Support Structures Medium N/A N/A Exudate A mount: Serosanguineous N/A N/A Exudate Type: red, brown N/A N/A Exudate Color: Small (1-33%) N/A N/A Granulation A mount: Red, Pink N/A N/A Granulation Quality: Large (67-100%) N/A N/A Necrotic A mount: Fat Layer (Subcutaneous Tissue): Yes N/A N/A Exposed Structures: None N/A N/A Epithelialization: Treatment Notes Electronic Signature(s) Signed: 04/08/2023  3:56:38 PM By: [name] Entered By: [name] on 04/08/2023 04:44:58 -------------------------------------------------------------------------------- Multi-Disciplinary Care Plan Details Patient Name: Date of Service: [name] 04/08/2023 7:30 A M Medical Record Number: [id number] Patient Account Number: [id number] Date of Birth/Sex: Treating RN: 1980/09/21 (42 y.o. [name] Primary Care Sharmayne Jablon: [name] Other Clinician: Referring Anastazja Isaac: Treating Victoriana Aziz/Extender: [name] in Treatment: 2 Active Inactive Wound/Skin Impairment Nursing Diagnoses: Impaired tissue integrity Knowledge deficit related to ulceration/compromised skin integrity Goals: Ulcer/skin breakdown will have a volume reduction of 30% by week 4 Date Initiated: 03/25/2023 Target Resolution Date: 04/22/2023 Goal Status: Active Ulcer/skin breakdown will have a volume reduction of 50% by week 8 Date  Initiated: 03/25/2023 Target Resolution Date: 05/20/2023 Goal Status: Active Ulcer/skin breakdown will have a volume reduction of 80% by week 12 Date Initiated: 03/25/2023 Target Resolution Date: 06/17/2023 Goal Status: Active Ulcer/skin breakdown will heal within 14 weeks Date Initiated: 03/25/2023 Target Resolution Date: 07/01/2023 Goal Status: Active Interventions: Assess patient/caregiver ability to obtain necessary supplies Assess patient/caregiver ability to perform ulcer/skin care regimen upon admission and as needed Assess ulceration(s) every visit Provide education on ulcer and skin care NotesSRIANSH, [name] ([id number]) [id number] Page 6 of 9 Electronic Signature(s) Signed: 04/08/2023 3:56:38 PM By: [name] Entered By: [name] on 04/08/2023 05:13:07 -------------------------------------------------------------------------------- Pain Assessment Details Patient Name: Date of Service: [name], [name] 04/08/2023 7:30 A M Medical Record Number: [id number] Patient Account Number: [id number] Date of Birth/Sex: Treating RN: 10-Dec-1980 (43 y.o. [name] Primary Care Nakeeta Sebastiani: [name] Other Clinician: Referring Lesslie Mckeehan: Treating Malicia Blasdel/Extender: [name] in Treatment: 2 Active Problems Location of Pain Severity and Description of Pain Patient Has Paino No Site Locations Rate the pain. Current Pain Level: 0 Pain Management and Medication Current Pain Management: Electronic Signature(s) Signed: 04/08/2023 8:19:41 AM By: [name] Entered By: [name] on 04/08/2023 05:19:40 Patient/Caregiver Education Details -------------------------------------------------------------------------------- [name] ([id number]) [id number] Page 7 of 9 Patient Name: Date of Service: [facility], [name] 11/19/2024andnbsp7:30 A M Medical Record Number: [id number] Patient  Account Number: [id number] Date of Birth/Gender: Treating RN: 09-29-1980 (42 y.o. [name] Primary Care Physician: [name] Other Clinician: Referring Physician: Treating Physician/Extender: [name] in Treatment: 2 Education Assessment Education Provided To: Patient Education Topics Provided Wound/Skin Impairment: Handouts: Caring for Your Ulcer Methods: Explain/Verbal Responses: State content correctly Electronic Signature(s) Signed: 04/08/2023 3:56:38 PM By: [name] Entered By: [name] on 04/08/2023 05:13:16 -------------------------------------------------------------------------------- Wound Assessment Details Patient Name: Date of Service: [name], [name] 04/08/2023 7:30 A M Medical Record Number: [id number] Patient Account Number: [id number] Date of Birth/Sex: Treating RN: 10-23-1980 (42 y.o. [name] Primary Care Dillon Livermore: [name] Other Clinician: Referring Rosemary Mossbarger: Treating Tedd Cottrill/Extender: [name] in Treatment: 2 Wound Status Wound Number: 4 Primary Etiology: Pyoderma Wound Location: Left, Lateral Lower Leg Wound Status: Open Wounding Event: Gradually Appeared Comorbid History: Sleep Apnea, Hypertension, Colitis, Type II Diabetes Date Acquired: 03/13/2023 Weeks Of Treatment: 2 Clustered Wound: Yes Photos Wound Measurements Length: (cm) Width: (cm) Depth: (cm) [name], [name] ([id number]) Clustered Quantity: Area: (cm) Volume: (cm) 1.9 % Reduction in Area: 5.5% 1.1 % Reduction in Volume: 52.7% 0.1 Epithelialization: None [id number] Page 8 of 9 2 Tunneling: No 1.641 Undermining: No 0.164 Wound Description Classification: Full Thickness Without Exposed Supp Exudate Amount: Medium Exudate Type: Serosanguineous Exudate Color: red, brown ort Structures Foul Odor After Cleansing: No Slough/Fibrino Yes Wound Bed Granulation  Amount:  Small (1-33%) Exposed Structure Granulation Quality: Red, Pink Fat Layer (Subcutaneous Tissue) Exposed: Yes Necrotic Amount: Large (67-100%) Necrotic Quality: Adherent Slough Treatment Notes Wound #4 (Lower Leg) Wound Laterality: Left, Lateral Cleanser Soap and Water Discharge Instruction: Gently cleanse wound with antibacterial soap, rinse and pat dry prior to dressing wounds Wound Cleanser Discharge Instruction: Wash your hands with soap and water. Remove old dressing, discard into plastic bag and place into trash. Cleanse the wound with Wound Cleanser prior to applying a clean dressing using gauze sponges, not tissues or cotton balls. Do not scrub or use excessive force. Pat dry using gauze sponges, not tissue or cotton balls. Peri-Wound Care Triamcinolone Acetonide Cream, 0.1%, 15 (g) tube Discharge Instruction: to leg for itching Apply as directed. Topical Clobetasol Propionate ointment 0.05%, 60 (g) tube Discharge Instruction: apply to periwound Primary Dressing Promogran Matrix 4.34 (in) Discharge Instruction: Moisten w/normal saline or sterile water; Cover wound as directed. Do not remove from wound bed. Secondary Dressing Zetuvit Plus 4x4 (in/in) Secured With Compression Wrap Urgo K2, two layer compression system, large Compression Stockings Add-Ons Electronic Signature(s) Signed: 04/08/2023 3:56:38 PM By: [name] Entered By: [name] on 04/08/2023 04:44:00 -------------------------------------------------------------------------------- Vitals Details Patient Name: Date of Service: [name], [name] 04/08/2023 7:30 A M Medical Record Number: [id number] Patient Account Number: [id number] Date of Birth/Sex: Treating RN: Sep 20, 1980 (42 y.o. [name] Primary Care Tiera Mensinger: [name] Other Clinician: Laray [name] ([id number]) [id number] Page 9 of 9 Referring Winston Misner: Treating Kyrie Bun/Extender: [name] in Treatment: 2 Vital Signs Time Taken: 07:35 Temperature (F): 97.7 Height (in): 70 Pulse (bpm): 85 Weight (lbs): 329 Respiratory Rate (breaths/min): 18 Body Mass Index (BMI): 47.2 Blood Pressure (mmHg): 131/84 Reference Range: 80 - 120 mg / dl Electronic Signature(s) Signed: 04/08/2023 8:19:32 AM By: [name] Entered By: [name] on 04/08/2023 05:19:32

## 2023-04-15 ENCOUNTER — Encounter: Payer: 59 | Admitting: Physician Assistant

## 2023-04-15 DIAGNOSIS — I87332 Chronic venous hypertension (idiopathic) with ulcer and inflammation of left lower extremity: Secondary | ICD-10-CM | POA: Diagnosis not present

## 2023-04-16 DIAGNOSIS — E1165 Type 2 diabetes mellitus with hyperglycemia: Secondary | ICD-10-CM

## 2023-04-16 MED ORDER — TIRZEPATIDE 12.5 MG/0.5ML ~~LOC~~ SOAJ
12.5000 mg | SUBCUTANEOUS | 0 refills | Status: DC
Start: 2023-04-16 — End: 2023-06-26

## 2023-04-16 NOTE — Progress Notes (Signed)
SANIL, ROERIG ([id number]) [id number] Page 1 of 7 Visit Report for 04/15/2023 Chief Complaint Document Details Patient Name: Date of Service: [place], [name] [name] 04/15/2023 7:30 A M Medical Record Number: [id number] Patient Account Number: [id number] Date of Birth/Sex: Treating RN: Nov 28, 1980 (42 y.o. [name] Primary Care Provider: [name] Other Clinician: Referring Provider: Treating Provider/Extender: Robbie Louis in Treatment: 3 Information Obtained from: Patient Chief Complaint LLE pyoderma ulcer Electronic Signature(s) Signed: 04/15/2023 8:36:37 AM By: Allen Derry PA-C Entered By: Allen Derry on 04/15/2023 08:36:37 -------------------------------------------------------------------------------- HPI Details Patient Name: Date of Service: [name], RO [name] 04/15/2023 7:30 A M Medical Record Number: [id number] Patient Account Number: [id number] Date of Birth/Sex: Treating RN: [birth year] (42 y.o. [name] Primary Care Provider: [name] Other Clinician: Referring Provider: Treating Provider/Extender: Robbie Louis in Treatment: 3 History of Present Illness HPI Description: 12/04/16; 42 year old man who comes into the clinic today for review of a wound on the posterior left calf. He tells me that is been there for about a year. He is not a diabetic he does smoke half a pack per day. He was seen in the ER on 11/20/16 felt to have cellulitis around the wound and was given clindamycin. An x-ray did not show osteomyelitis. The patient initially tells me that he has a milk allergy that sets off a pruritic itching rash on his lower legs which she scratches incessantly and he thinks that's what may have set up the wound. He has been using various topical antibiotics and ointments without any effect. He works in a [employer]ing [employer] and is on his feet all day. He does not have a prior history of  wounds however he does have the rash on both lower legs the right arm and the ventral aspect of his left arm. These are excoriations and clearly have had scratching however there are of macular looking areas on both legs including a substantial larger area on the right leg. This does not have an underlying open area. There is no blistering. 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 He seemed to recognize this. On further questioning he admits to a 5 year history of quiesced. ulcerative colitis. He is not in any treatment for this. He's had no recent travel 04/02/17; the patient was seen last week by Dr. Carles Collet at [facility] dermatology locally who kindly saw him at my request. 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 My concern was the degree of erythema medially extending from his left leg wound which was either inflammation from pyoderma or cellulitis. I put him on Augmentin however culture of the wound showed Pseudomonas which is quinolone sensitive. I really don't believe he has cellulitis however in view of everything I will continue and give him a course of Cipro. He is also on doxycycline as an immune modulator for the pyoderma. IYAD, MCVOY ([id number]) [id number] Page 2 of 7 In addition to his original wound on the left lateral leg with surrounding erythema he has a wound on the right posterior calf which was an original biopsy site done by dermatology. This was felt to represent pathergy from pyoderma gangrenosum Readmission: 03-25-2023 patient presents today for readmission here in the clinic concerning issues he  is having with a wound in the same area that we previously took care of for him. He tells me that around 24  October he had an area that opened and he just has not been able to get the close he thought it might be best to come back here as opposed to try to do it himself and letting it get worse. He continues with the Remicade infusions every 6 weeks that really has not changed. His past medical history also has not changed significantly from what I saw him last. He was discharged October 2023. 04-01-2023 upon evaluation today patient appears to be doing well currently in regard to his wound there is no signs of infection I think that the film and biofilm on the surface of the wound went right off there was no need for sharp debridement with this being pyoderma he does not allow for debridement anyways. Fortunately I do think that he is making really good progress here towards closure however and I feel like that the patient is doing quite well already and its only been 1 week. The wrap did start to slide on Sunday he took it off and use his own compression he did a very good job keeping this under control his leg is definitely smaller this week compared to last week. 04-08-2023 upon evaluation today patient appears to be doing excellent in regard to his wound he is already showing signs of new granulation growth which is great news and I am very pleased in that regard. Fortunately I do not see any signs of active infection at this time. No fevers, chills, nausea, vomiting, or diarrhea. 04-15-2023 upon evaluation patient appears to be doing well currently in regard to his wound. He is actually showing signs of improvement and I feel like that this is making headway towards closure fairly rapidly this is great news. Electronic Signature(s) Signed: 04/15/2023 5:20:26 PM By: Allen Derry PA-C Entered By: Allen Derry on 04/15/2023 17:20:26 -------------------------------------------------------------------------------- Physical Exam Details Patient Name: Date of Service: [name], RO [name] 04/15/2023 7:30 A  M Medical Record Number: [id number] Patient Account Number: [id number] Date of Birth/Sex: Treating RN: 1981/04/26 (42 y.o. [name] Primary Care Provider: [name] Other Clinician: Referring Provider: Treating Provider/Extender: Robbie Louis in Treatment: 3 Constitutional Well-nourished and well-hydrated in no acute distress. Respiratory normal breathing without difficulty. Psychiatric this patient is able to make decisions and demonstrates good insight into disease process. Alert and Oriented x 3. pleasant and cooperative. Notes Upon inspection patient's wound bed showed signs of good granulation epithelization at this point. Fortunately I do not see any signs of worsening overall and I believe that the patient is doing well this is a pyoderma location which sometimes has been very difficult to heal in the past reason to be responding quite nicely at this point. Electronic Signature(s) Signed: 04/15/2023 5:20:59 PM By: Allen Derry PA-C Entered By: Allen Derry on 04/15/2023 17:20:58 Puffenbarger, [name] ([id number]) [id number] Page 3 of 7 -------------------------------------------------------------------------------- Physician Orders Details Patient Name: Date of Service: [name], RO [name] 04/15/2023 7:30 A M Medical Record Number: [id number] Patient Account Number: [id number] Date of Birth/Sex: Treating RN: [birth year] (42 y.o. [name] Primary Care Provider: [name] Other Clinician: Referring Provider: Treating Provider/Extender: Robbie Louis in Treatment: 3 The following information was scribed by: Angelina Pih The information was scribed for: Allen Derry Verbal / Phone Orders: No Diagnosis Coding Follow-up Appointments Return Appointment in 1 week. Nurse Visit  as needed - PRN Bathing/ Shower/ Hygiene May shower with wound dressing protected with water repellent cover or cast  protector. No tub bath. Anesthetic (Use 'Patient Medications' Section for Anesthetic Order Entry) Lidocaine applied to wound bed Edema Control - Orders / Instructions Elevate, Exercise Daily and A void Standing for Long Periods of Time. Elevate leg(s) parallel to the floor when sitting. DO YOUR BEST to sleep in the bed at night. DO NOT sleep in your recliner. Long hours of sitting in a recliner leads to swelling of the legs and/or potential wounds on your backside. Non-Wound Condition dditional non-wound orders/instructions: - nystatin cream to left foot/toes for irritation A Wound Treatment Wound #4 - Lower Leg Wound Laterality: Left, Lateral Cleanser: Soap and Water 1 x Per Week/15 Days Discharge Instructions: Gently cleanse wound with antibacterial soap, rinse and pat dry prior to dressing wounds Cleanser: Wound Cleanser 1 x Per Week/15 Days Discharge Instructions: Wash your hands with soap and water. Remove old dressing, discard into plastic bag and place into trash. Cleanse the wound with Wound Cleanser prior to applying a clean dressing using gauze sponges, not tissues or cotton balls. Do not scrub or use excessive force. Pat dry using gauze sponges, not tissue or cotton balls. Peri-Wound Care: Triamcinolone Acetonide Cream, 0.1%, 15 (g) tube 1 x Per Week/15 Days Discharge Instructions: to leg for itching Apply as directed. Topical: Clobetasol Propionate ointment 0.05%, 60 (g) tube 1 x Per Week/15 Days Discharge Instructions: apply to periwound Prim Dressing: Promogran Matrix 4.34 (in) 1 x Per Week/15 Days ary Discharge Instructions: Moisten w/normal saline or sterile water; Cover wound as directed. Do not remove from wound bed. Secondary Dressing: Zetuvit Plus 4x4 (in/in) 1 x Per Week/15 Days Compression Wrap: Urgo K2, two layer compression system, large 1 x Per Week/15 Days Electronic Signature(s) Signed: 04/15/2023 8:19:13 AM By: Angelina Pih Signed: 04/15/2023 5:34:59 PM  By: Allen Derry PA-C Entered By: Angelina Pih on 04/15/2023 08:19:13 Howat, [name] ([id number]) [id number] Page 4 of 7 -------------------------------------------------------------------------------- Problem List Details Patient Name: Date of Service: [place], [name] [name] 04/15/2023 7:30 A M Medical Record Number: [id number] Patient Account Number: [id number] Date of Birth/Sex: Treating RN: 10-05-1980 (42 y.o. [name] Primary Care Provider: [name] Other Clinician: Referring Provider: Treating Provider/Extender: Robbie Louis in Treatment: 3 Active Problems ICD-10 Encounter Code Description Active Date MDM Diagnosis I87.332 Chronic venous hypertension (idiopathic) with ulcer and inflammation of left 03/25/2023 No Yes lower extremity L97.222 Non-pressure chronic ulcer of left calf with fat layer exposed 03/25/2023 No Yes E11.622 Type 2 diabetes mellitus with other skin ulcer 03/25/2023 No Yes L88 Pyoderma gangrenosum 03/25/2023 No Yes F17.208 Nicotine dependence, unspecified, with other nicotine-induced disorders 03/25/2023 No Yes Inactive Problems Resolved Problems Electronic Signature(s) Signed: 04/15/2023 8:36:28 AM By: Allen Derry PA-C Entered By: Allen Derry on 04/15/2023 08:36:28 -------------------------------------------------------------------------------- Progress Note Details Patient Name: Date of Service: [name], RO [name] 04/15/2023 7:30 A M Medical Record Number: [id number] Patient Account Number: [id number] Date of Birth/Sex: Treating RN: April 13, 1981 (42 y.o. [name] Gering, [place] ([id number]) [id number] Page 5 of 7 Primary Care Provider: [name] Other Clinician: Referring Provider: Treating Provider/Extender: Robbie Louis in Treatment: 3 Subjective Chief Complaint Information obtained from Patient LLE pyoderma ulcer History of  Present Illness (HPI) 12/04/16; 42 year old man who comes into the clinic today for review of a wound on the posterior left calf. He tells me that is been there for about a year. He is not  a diabetic he does smoke half a pack per day. He was seen in the ER on 11/20/16 felt to have cellulitis around the wound and was given clindamycin. An x- ray did not show osteomyelitis. The patient initially tells me that he has a milk allergy that sets off a pruritic itching rash on his lower legs which she scratches incessantly and he thinks that's what may have set up the wound. He has been using various topical antibiotics and ointments without any effect. He works in a [employer]ing [employer] and is on his feet all day. He does not have a prior history of wounds however he does have the rash on both lower legs the right arm and the ventral aspect of his left arm. These are excoriations and clearly have had scratching however there are of macular looking areas on both legs including a substantial larger area on the right leg. This does not have an underlying open area. There is no blistering. 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 He seemed to recognize this. On further questioning he admits to a 5 year history of quiesced. ulcerative colitis. He is not in any treatment for this. He's had no recent travel 04/02/17; the patient was seen last week by Dr. Carles Collet at [facility] dermatology locally who kindly saw him at my request. 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 My concern was the degree of erythema medially extending from his left leg wound which was either inflammation from pyoderma or cellulitis. I put him on Augmentin however culture of the wound showed Pseudomonas which  is quinolone sensitive. I really don't believe he has cellulitis however in view of everything I will continue and give him a course of Cipro. He is also on doxycycline as an immune modulator for the pyoderma. In addition to his original wound on the left lateral leg with surrounding erythema he has a wound on the right posterior calf which was an original biopsy site done by dermatology. This was felt to represent pathergy from pyoderma gangrenosum Readmission: 03-25-2023 patient presents today for readmission here in the clinic concerning issues he is having with a wound in the same area that we previously took care of for him. He tells me that around 24 October he had an area that opened and he just has not been able to get the close he thought it might be best to come back here as opposed to try to do it himself and letting it get worse. He continues with the Remicade infusions every 6 weeks that really has not changed. His past medical history also has not changed significantly from what I saw him last. He was discharged October 2023. 04-01-2023 upon evaluation today patient appears to be doing well currently in regard to his wound there is no signs of infection I think that the film and biofilm on the surface of the wound went right off there was no need for sharp debridement with this being pyoderma he does not allow for debridement anyways. Fortunately I do think that he is making really good progress here towards closure however and I feel like that the patient is doing quite well already and its only been 1 week. The wrap did start to slide  on Sunday he took it off and use his own compression he did a very good job keeping this under control his leg is definitely smaller this week compared to last week. 04-08-2023 upon evaluation today patient appears to be doing excellent in regard to his wound he is already showing signs of new granulation growth which is great news and I am very pleased  in that regard. Fortunately I do not see any signs of active infection at this time. No fevers, chills, nausea, vomiting, or diarrhea. 04-15-2023 upon evaluation patient appears to be doing well currently in regard to his wound. He is actually showing signs of improvement and I feel like that this is making headway towards closure fairly rapidly this is great news. Objective Constitutional Well-nourished and well-hydrated in no acute distress. Vitals Time Taken: 7:45 AM, Height: 70 in, Weight: 329 lbs, BMI: 47.2, Temperature: 97.8 F, Pulse: 90 bpm, Respiratory Rate: 18 breaths/min, Blood Pressure: 128/84 mmHg. Respiratory normal breathing without difficulty. Psychiatric this patient is able to make decisions and demonstrates good insight into disease process. Alert and Oriented x 3. pleasant and cooperative. General Notes: Upon inspection patient's wound bed showed signs of good granulation epithelization at this point. Fortunately I do not see any signs of worsening overall and I believe that the patient is doing well this is a pyoderma location which sometimes has been very difficult to heal in the past reason to be responding quite nicely at this point. Integumentary (Hair, Skin) Wound #4 status is Open. Original cause of wound was Gradually Appeared. The date acquired was: 03/13/2023. The wound has been in treatment 3 weeks. The wound is located on the Left,Lateral Lower Leg. The wound measures 1.6cm length x 0.8cm width x 0.1cm depth; 1.005cm^2 area and 0.101cm^3 volume. There is Fat Layer (Subcutaneous Tissue) exposed. There is no tunneling or undermining noted. There is a medium amount of serosanguineous drainage noted. There is small (1-33%) red, pink granulation within the wound bed. There is a large (67-100%) amount of necrotic tissue within the wound bed including Adherent Slough. 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 There was a Double Layer Compression Therapy Procedure by Angelina Pih, RN. Post procedure Diagnosis Wound #4: Same as Pre-Procedure Plan Follow-up Appointments: Return Appointment in 1 week. Nurse Visit as needed - PRN Bathing/ Shower/ Hygiene: May shower with wound dressing protected with water repellent cover or cast protector. No tub bath. Anesthetic (Use 'Patient Medications' Section for Anesthetic Order Entry): Lidocaine applied to wound bed Edema Control - Orders / Instructions: Elevate, Exercise Daily and Avoid Standing for Long Periods of Time. Elevate leg(s) parallel to the floor when sitting. DO YOUR BEST to sleep in the bed at night. DO NOT sleep in your recliner. Long hours of sitting in a recliner leads to swelling of the legs and/or potential wounds on your backside. Non-Wound Condition: Additional non-wound orders/instructions: - nystatin cream to left foot/toes for irritation WOUND #4: - Lower Leg Wound Laterality: Left, Lateral Cleanser: Soap and Water 1 x Per Week/15 Days Discharge Instructions: Gently cleanse wound with antibacterial soap, rinse and pat dry prior to dressing wounds Cleanser:  Wound Cleanser 1 x Per Week/15 Days Discharge Instructions: Wash your hands with soap and water. Remove old dressing, discard into plastic bag and place into trash. Cleanse the wound with Wound Cleanser prior to applying a clean dressing using gauze sponges, not tissues or cotton balls. Do not scrub or use excessive force. Pat dry using gauze sponges, not  tissue or cotton balls. Peri-Wound Care: Triamcinolone Acetonide Cream, 0.1%, 15 (g) tube 1 x Per Week/15 Days Discharge Instructions: to leg for itching Apply as directed. Topical: Clobetasol Propionate ointment 0.05%, 60 (g) tube 1 x Per Week/15 Days Discharge Instructions: apply to periwound Prim Dressing: Promogran Matrix 4.34 (in) 1 x Per Week/15 Days ary Discharge Instructions: Moisten w/normal saline or sterile water; Cover wound as directed. Do not remove from wound bed. Secondary Dressing: Zetuvit Plus 4x4 (in/in) 1 x Per Week/15 Days Com pression Wrap: Urgo K2, two layer compression system, large 1 x Per Week/15 Days 1. I would recommend based on what we are seeing that we going continue with the wound care measures as before and the patient is in agreement with that plan. This includes the use of the Promogran followed by the triamcinolone to the leg and the clobetasol immediately over and around the wound location. This seems to be doing excellent for him. 2. He will use a Zetuvit over top followed by the Urgo K2 compression wrap. We will see patient back for reevaluation in 1 week here in the clinic. If anything worsens or changes patient will contact our office for additional recommendations. Electronic Signature(s) Signed: 04/15/2023 5:21:35 PM By: Allen Derry PA-C Entered By: Allen Derry on 04/15/2023 17:21:35 Lamoureaux, [name] ([id number]) [id number] Page 7 of 7 -------------------------------------------------------------------------------- SuperBill Details Patient Name: Date of Service: [name], RO [name] 04/15/2023 Medical Record Number: [id number] Patient Account Number: [id number] Date of Birth/Sex: Treating RN: Jun 16, 1980 (42 y.o. [name] Primary Care Provider: [name] Other Clinician: Referring Provider: Treating Provider/Extender: Robbie Louis in Treatment: 3 Diagnosis Coding ICD-10 Codes Code  Description [phone] Chronic venous hypertension (idiopathic) with ulcer and inflammation of left lower extremity L97.222 Non-pressure chronic ulcer of left calf with fat layer exposed E11.622 Type 2 diabetes mellitus with other skin ulcer L88 Pyoderma gangrenosum F17.208 Nicotine dependence, unspecified, with other nicotine-induced disorders Facility Procedures : CPT4 Code: [id number] Description: (Facility Use Only) [phone] - APPLY MULTLAY COMPRS LWR LT LEG Modifier: Quantity: 1 Physician Procedures : CPT4 Code Description Modifier [id number] - WC PHYS LEVEL 3 - EST PT ICD-10 Diagnosis Description I87.332 Chronic venous hypertension (idiopathic) with ulcer and inflammation of left lower extremity L97.222 Non-pressure chronic ulcer of left calf  with fat layer exposed E11.622 Type 2 diabetes mellitus with other skin ulcer L88 Pyoderma gangrenosum Quantity: 1 Electronic Signature(s) Signed: 04/15/2023 5:22:08 PM By: Allen Derry PA-C Previous Signature: 04/15/2023 8:19:29 AM Version By: Angelina Pih Entered By: Allen Derry on 04/15/2023 17:22:08

## 2023-04-16 NOTE — Progress Notes (Signed)
[name], [name] ([id number]) [id number] Page 1 of 9 Visit Report for 04/15/2023 Arrival Information Details Patient Name: Date of Service: [place], [name] 04/15/2023 7:30 A M Medical Record Number: [id number] Patient Account Number: [id number] Date of Birth/Sex: Treating RN: 07/18/1980 (42 y.o. [name] Primary Care Rosemarie Galvis: [name] Other Clinician: Referring Latoya Diskin: Treating Aydia Maj/Extender: Robbie Louis in Treatment: 3 Visit Information History Since Last Visit Added or deleted any medications: No Patient Arrived: Ambulatory Any new allergies or adverse reactions: No Arrival Time: 07:48 Had a fall or experienced change in No Accompanied By: self activities of daily living that may affect Transfer Assistance: None risk of falls: Patient Identification Verified: Yes Hospitalized since last visit: No Secondary Verification Process Completed: Yes Has Dressing in Place as Prescribed: Yes Patient Requires Transmission-Based Precautions: No Has Compression in Place as Prescribed: Yes Patient Has Alerts: No Pain Present Now: No Electronic Signature(s) Signed: 04/15/2023 7:49:11 AM By: Angelina Pih Entered By: Angelina Pih on 04/15/2023 07:49:11 -------------------------------------------------------------------------------- Clinic Level of Care Assessment Details Patient Name: Date of Service: [name], [name] 04/15/2023 7:30 A M Medical Record Number: [id number] Patient Account Number: [id number] Date of Birth/Sex: Treating RN: 1980-10-14 (42 y.o. [name] Primary Care Marlyn Tondreau: [name] Other Clinician: Referring Bri Wakeman: Treating Fedora Knisely/Extender: Robbie Louis in Treatment: 3 Clinic Level of Care Assessment Items TOOL 1 Quantity Score []  - 0 Use when EandM and Procedure is performed on INITIAL visit ASSESSMENTS - Nursing Assessment / Reassessment []  -  0 General Physical Exam (combine w/ comprehensive assessment (listed just below) when performed on new pt. evals) []  - 0 Comprehensive Assessment (HX, ROS, Risk Assessments, Wounds Hx, etc.) ASSESSMENTS - Wound and Skin Assessment / Reassessment []  - 0 Dermatologic / Skin Assessment (not related to wound area) [name], [name] ([id number]) [id number] Page 2 of 9 ASSESSMENTS - Ostomy and/or Continence Assessment and Care []  - 0 Incontinence Assessment and Management []  - 0 Ostomy Care Assessment and Management (repouching, etc.) PROCESS - Coordination of Care []  - 0 Simple Patient / Family Education for ongoing care []  - 0 Complex (extensive) Patient / Family Education for ongoing care []  - 0 Staff obtains Chiropractor, Records, T Results / Process Orders est []  - 0 Staff telephones HHA, Nursing Homes / Clarify orders / etc []  - 0 Routine Transfer to another Facility (non-emergent condition) []  - 0 Routine Hospital Admission (non-emergent condition) []  - 0 New Admissions / Manufacturing engineer / Ordering NPWT Apligraf, etc. , []  - 0 Emergency Hospital Admission (emergent condition) PROCESS - Special Needs []  - 0 Pediatric / Minor Patient Management []  - 0 Isolation Patient Management []  - 0 Hearing / Language / Visual special needs []  - 0 Assessment of Community assistance (transportation, D/C planning, etc.) []  - 0 Additional assistance / Altered mentation []  - 0 Support Surface(s) Assessment (bed, cushion, seat, etc.) INTERVENTIONS - Miscellaneous []  - 0 External ear exam []  - 0 Patient Transfer (multiple staff / Nurse, adult / Similar devices) []  - 0 Simple Staple / Suture removal (25 or less) []  - 0 Complex Staple / Suture removal (26 or more) []  - 0 Hypo/Hyperglycemic Management (do not check if billed separately) []  - 0 Ankle / Brachial Index (ABI) - do not check if billed separately Has the patient been seen at the hospital within  the last three years: Yes Total Score: 0 Level Of Care: ____ Electronic Signature(s) Signed: 04/15/2023 5:08:14 PM By: Angelina Pih Entered By: Angelina Pih  on 04/15/2023 08:19:19 -------------------------------------------------------------------------------- Compression Therapy Details Patient Name: Date of Service: [name], [name] 04/15/2023 7:30 A M Medical Record Number: [id number] Patient Account Number: [id number] Date of Birth/Sex: Treating RN: [birth year] (42 y.o. [name] Primary Care Trine Fread: [name] Other Clinician: Referring Tashawn Greff: Treating Preslea Rhodus/Extender: Robbie Louis in Treatment: 3 Compression Therapy Performed for Wound Assessment: Wound #4 Left,Lateral Lower Leg Performed By: Clinician Angelina Pih, RN Compression TypeThurmond Butts ([id number]) [id number] Page 3 of 9 Post Procedure Diagnosis Same as Pre-procedure Electronic Signature(s) Signed: 04/15/2023 8:18:34 AM By: Angelina Pih Entered By: Angelina Pih on 04/15/2023 08:18:34 -------------------------------------------------------------------------------- Encounter Discharge Information Details Patient Name: Date of Service: [name], [name] 04/15/2023 7:30 A M Medical Record Number: [id number] Patient Account Number: [id number] Date of Birth/Sex: Treating RN: 1980-11-26 (42 y.o. [name] Primary Care Reginal Wojcicki: [name] Other Clinician: Referring Alben Jepsen: Treating Zhyon Antenucci/Extender: Robbie Louis in Treatment: 3 Encounter Discharge Information Items Discharge Condition: Stable Ambulatory Status: Ambulatory Discharge Destination: Home Transportation: Private Auto Accompanied By: self Schedule Follow-up Appointment: Yes Clinical Summary of Care: Electronic Signature(s) Signed: 04/15/2023 8:20:24 AM By: Angelina Pih Entered By: Angelina Pih on 04/15/2023  08:20:24 -------------------------------------------------------------------------------- Lower Extremity Assessment Details Patient Name: Date of Service: [name], [name] 04/15/2023 7:30 A M Medical Record Number: [id number] Patient Account Number: [id number] Date of Birth/Sex: Treating RN: Feb 12, 1981 (42 y.o. [name] Primary Care Edmundo Tedesco: [name] Other Clinician: Referring Amani Marseille: Treating Reilly Molchan/Extender: Robbie Louis in Treatment: 3 Edema Assessment Assessed: [Left: No] [Right: No] Edema: [Left: N] [Right: o] Calf Kadrmas, [name] Maduro ([id number]) [id number] Page 4 of 9 Left: Right: Point of Measurement: 34 cm From Medial Instep 46.5 cm Ankle Left: Right: Point of Measurement: 12 cm From Medial Instep 27.5 cm Vascular Assessment Pulses: Dorsalis Pedis Palpable: [Left:Yes] Extremity colors, hair growth, and conditions: Extremity Color: [Left:Hyperpigmented] Hair Growth on Extremity: [Left:Yes] Temperature of Extremity: [Left:Warm < 3 seconds] Toe Nail Assessment Left: Right: Thick: Yes Discolored: Yes Deformed: No Improper Length and Hygiene: No Electronic Signature(s) Signed: 04/15/2023 5:08:14 PM By: Angelina Pih Entered By: Angelina Pih on 04/15/2023 07:47:35 -------------------------------------------------------------------------------- Multi Wound Chart Details Patient Name: Date of Service: [name], [name] 04/15/2023 7:30 A M Medical Record Number: [id number] Patient Account Number: [id number] Date of Birth/Sex: Treating RN: 11/04/1980 (42 y.o. [name] Primary Care Kilah Drahos: [name] Other Clinician: Referring Lelania Bia: Treating Lulie Hurd/Extender: Robbie Louis in Treatment: 3 Vital Signs Height(in): 70 Pulse(bpm): 90 Weight(lbs): 329 Blood Pressure(mmHg): 128/84 Body Mass Index(BMI): 47.2 Temperature(F): 97.8 Respiratory Rate(breaths/min):  18 [4:Photos:] [N/A:N/A] Left, Lateral Lower Leg N/A N/A Wound Location: Gradually Appeared N/A N/A Wounding Event: Pyoderma N/A N/A Primary EtiologyJERSEY, ROMEL ([id number]) [id number] Page 5 of 9 Sleep Apnea, Hypertension, Colitis, N/A N/A Comorbid History: Type II Diabetes 03/13/2023 N/A N/A Date Acquired: 3 N/A N/A Weeks of Treatment: Open N/A N/A Wound Status: No N/A N/A Wound Recurrence: Yes N/A N/A Clustered Wound: 2 N/A N/A Clustered Quantity: 1.6x0.8x0.1 N/A N/A Measurements L x W x D (cm) 1.005 N/A N/A A (cm) : rea 0.101 N/A N/A Volume (cm) : 42.10% N/A N/A % Reduction in Area: 70.90% N/A N/A % Reduction in Volume: Full Thickness Without Exposed N/A N/A Classification: Support Structures Medium N/A N/A Exudate A mount: Serosanguineous N/A N/A Exudate Type: red, brown N/A N/A Exudate Color: Small (1-33%) N/A N/A Granulation A mount: Red, Pink N/A N/A Granulation Quality: Large (67-100%) N/A N/A Necrotic  A mount: Fat Layer (Subcutaneous Tissue): Yes N/A N/A Exposed Structures: Small (1-33%) N/A N/A Epithelialization: Treatment Notes Electronic Signature(s) Signed: 04/15/2023 8:18:18 AM By: Angelina Pih Entered By: Angelina Pih on 04/15/2023 08:18:18 -------------------------------------------------------------------------------- Multi-Disciplinary Care Plan Details Patient Name: Date of Service: [name], [name] 04/15/2023 7:30 A M Medical Record Number: [id number] Patient Account Number: [id number] Date of Birth/Sex: Treating RN: August 03, 1980 (42 y.o. [name] Primary Care Ketsia Linebaugh: [name] Other Clinician: Referring Eliska Hamil: Treating Kennidi Yoshida/Extender: Robbie Louis in Treatment: 3 Active Inactive Wound/Skin Impairment Nursing Diagnoses: Impaired tissue integrity Knowledge deficit related to ulceration/compromised skin integrity Goals: Ulcer/skin breakdown will  have a volume reduction of 30% by week 4 Date Initiated: 03/25/2023 Target Resolution Date: 04/22/2023 Goal Status: Active Ulcer/skin breakdown will have a volume reduction of 50% by week 8 Date Initiated: 03/25/2023 Target Resolution Date: 05/20/2023 Goal Status: Active Ulcer/skin breakdown will have a volume reduction of 80% by week 12 Date Initiated: 03/25/2023 Target Resolution Date: 06/17/2023 Goal Status: Active Ulcer/skin breakdown will heal within 14 weeks Date Initiated: 03/25/2023 Target Resolution Date: 07/01/2023 Goal Status: Active [name], [name] ([id number]) [id number] Page 6 of 9 Interventions: Assess patient/caregiver ability to obtain necessary supplies Assess patient/caregiver ability to perform ulcer/skin care regimen upon admission and as needed Assess ulceration(s) every visit Provide education on ulcer and skin care Notes: Electronic Signature(s) Signed: 04/15/2023 8:19:35 AM By: Angelina Pih Entered By: Angelina Pih on 04/15/2023 08:19:35 -------------------------------------------------------------------------------- Pain Assessment Details Patient Name: Date of Service: [name], [name] 04/15/2023 7:30 A M Medical Record Number: [id number] Patient Account Number: [id number] Date of Birth/Sex: Treating RN: [birth year] (42 y.o. [name] Primary Care Shawnee Gambone: [name] Other Clinician: Referring Debroah Shuttleworth: Treating Marwah Disbro/Extender: Robbie Louis in Treatment: 3 Active Problems Location of Pain Severity and Description of Pain Patient Has Paino No Site Locations Pain Management and Medication Current Pain Management: Electronic Signature(s) Signed: 04/15/2023 7:49:36 AM By: Angelina Pih Entered By: Angelina Pih on 04/15/2023 07:49:36 Sporrer, [name] Maduro ([id number]) [id number] Page 7 of  9 -------------------------------------------------------------------------------- Patient/Caregiver Education Details Patient Name: Date of Service: [name], [name] 11/26/2024andnbsp7:30 A M Medical Record Number: [id number] Patient Account Number: [id number] Date of Birth/Gender: Treating RN: February 01, 1981 (42 y.o. [name] Primary Care Physician: [name] Other Clinician: Referring Physician: Treating Physician/Extender: Robbie Louis in Treatment: 3 Education Assessment Education Provided To: Patient Education Topics Provided Wound/Skin Impairment: Handouts: Caring for Your Ulcer Methods: Explain/Verbal Responses: State content correctly Electronic Signature(s) Signed: 04/15/2023 5:08:14 PM By: Angelina Pih Entered By: Angelina Pih on 04/15/2023 08:19:45 -------------------------------------------------------------------------------- Wound Assessment Details Patient Name: Date of Service: [name], [name] 04/15/2023 7:30 A M Medical Record Number: [id number] Patient Account Number: [id number] Date of Birth/Sex: Treating RN: 1980/09/16 (42 y.o. [name] Primary Care Rachelann Enloe: [name] Other Clinician: Referring Lita Flynn: Treating Lucile Hillmann/Extender: Robbie Louis in Treatment: 3 Wound Status Wound Number: 4 Primary Etiology: Pyoderma Wound Location: Left, Lateral Lower Leg Wound Status: Open Wounding Event: Gradually Appeared Comorbid History: Sleep Apnea, Hypertension, Colitis, Type II Diabetes Date Acquired: 03/13/2023 Weeks Of Treatment: 3 Clustered Wound: Yes Photos [place], [name] Maduro ([id number]) [id number] Page 8 of 9 Wound Measurements Length: (cm) Width: (cm) Depth: (cm) Clustered Quantity: Area: (cm) Volume: (cm) 1.6 % Reduction in Area: 42.1% 0.8 % Reduction in Volume: 70.9% 0.1 Epithelialization: Small (1-33%) 2 Tunneling: No 1.005 Undermining:  No 0.101 Wound Description Classification: Full Thickness Without Exposed Supp Exudate Amount: Medium Exudate Type: Serosanguineous Exudate  Color: red, brown ort Structures Foul Odor After Cleansing: No Slough/Fibrino Yes Wound Bed Granulation Amount: Small (1-33%) Exposed Structure Granulation Quality: Red, Pink Fat Layer (Subcutaneous Tissue) Exposed: Yes Necrotic Amount: Large (67-100%) Necrotic Quality: Adherent Slough Treatment Notes Wound #4 (Lower Leg) Wound Laterality: Left, Lateral Cleanser Soap and Water Discharge Instruction: Gently cleanse wound with antibacterial soap, rinse and pat dry prior to dressing wounds Wound Cleanser Discharge Instruction: Wash your hands with soap and water. Remove old dressing, discard into plastic bag and place into trash. Cleanse the wound with Wound Cleanser prior to applying a clean dressing using gauze sponges, not tissues or cotton balls. Do not scrub or use excessive force. Pat dry using gauze sponges, not tissue or cotton balls. Peri-Wound Care Triamcinolone Acetonide Cream, 0.1%, 15 (g) tube Discharge Instruction: to leg for itching Apply as directed. Topical Clobetasol Propionate ointment 0.05%, 60 (g) tube Discharge Instruction: apply to periwound Primary Dressing Promogran Matrix 4.34 (in) Discharge Instruction: Moisten w/normal saline or sterile water; Cover wound as directed. Do not remove from wound bed. Secondary Dressing Zetuvit Plus 4x4 (in/in) Secured With Compression Wrap Urgo K2, two layer compression system, large Compression Stockings Add-Ons Electronic Signature(s) Signed: 04/15/2023 5:08:14 PM By: Angelina Pih Entered By: Angelina Pih on 04/15/2023 07:46:52 [name], [name] ([id number]) [id number] Page 9 of 9 -------------------------------------------------------------------------------- Vitals Details Patient Name: Date of Service: [name], [name] 04/15/2023 7:30 A  M Medical Record Number: [id number] Patient Account Number: [id number] Date of Birth/Sex: Treating RN: May 31, 1980 (42 y.o. [name] Primary Care Stefanos Haynesworth: [name] Other Clinician: Referring Romy Ipock: Treating Tasharra Nodine/Extender: Robbie Louis in Treatment: 3 Vital Signs Time Taken: 07:45 Temperature (F): 97.8 Height (in): 70 Pulse (bpm): 90 Weight (lbs): 329 Respiratory Rate (breaths/min): 18 Body Mass Index (BMI): 47.2 Blood Pressure (mmHg): 128/84 Reference Range: 80 - 120 mg / dl Electronic Signature(s) Signed: 04/15/2023 7:49:29 AM By: Angelina Pih Entered By: Angelina Pih on 04/15/2023 07:49:29

## 2023-04-23 ENCOUNTER — Encounter: Payer: 59 | Attending: Physician Assistant

## 2023-04-23 DIAGNOSIS — E11622 Type 2 diabetes mellitus with other skin ulcer: Secondary | ICD-10-CM | POA: Diagnosis not present

## 2023-04-23 DIAGNOSIS — I87332 Chronic venous hypertension (idiopathic) with ulcer and inflammation of left lower extremity: Secondary | ICD-10-CM | POA: Insufficient documentation

## 2023-04-23 DIAGNOSIS — L88 Pyoderma gangrenosum: Secondary | ICD-10-CM | POA: Insufficient documentation

## 2023-04-23 DIAGNOSIS — G4730 Sleep apnea, unspecified: Secondary | ICD-10-CM | POA: Diagnosis not present

## 2023-04-23 DIAGNOSIS — L97222 Non-pressure chronic ulcer of left calf with fat layer exposed: Secondary | ICD-10-CM | POA: Insufficient documentation

## 2023-04-23 DIAGNOSIS — F17208 Nicotine dependence, unspecified, with other nicotine-induced disorders: Secondary | ICD-10-CM | POA: Insufficient documentation

## 2023-04-23 DIAGNOSIS — I10 Essential (primary) hypertension: Secondary | ICD-10-CM | POA: Diagnosis not present

## 2023-04-23 NOTE — Progress Notes (Signed)
ELGER, COSTAS ([id number]) [id number] Page 1 of 4 Visit Report for 04/23/2023 Arrival Information Details Patient Name: Date of Service: [place], [name] 04/23/2023 9:30 A M Medical Record Number: [id number] Patient Account Number: [id number] Date of Birth/Sex: Treating RN: June 29, 1980 (42 y.o. Judie Petit) Yevonne Pax Primary Care Shaneya Taketa: [name] Other Clinician: Referring Jacaden Forbush: Treating Euan Wandler/Extender: Robbie Louis in Treatment: 4 Visit Information History Since Last Visit Added or deleted any medications: No Patient Arrived: Ambulatory Any new allergies or adverse reactions: No Arrival Time: 09:33 Had a fall or experienced change in No Accompanied By: self activities of daily living that may affect Transfer Assistance: None risk of falls: Patient Identification Verified: Yes Signs or symptoms of abuse/neglect since last visito No Secondary Verification Process Completed: Yes Hospitalized since last visit: No Patient Requires Transmission-Based Precautions: No Implantable device outside of the clinic excluding No Patient Has Alerts: No cellular tissue based products placed in the center since last visit: Has Dressing in Place as Prescribed: Yes Has Compression in Place as Prescribed: No Pain Present Now: No Electronic Signature(s) Signed: 04/23/2023 9:57:51 AM By: Yevonne Pax RN Entered By: Yevonne Pax on 04/23/2023 06:57:51 -------------------------------------------------------------------------------- Clinic Level of Care Assessment Details Patient Name: Date of Service: [place], [name] 04/23/2023 9:30 A M Medical Record Number: [id number] Patient Account Number: [id number] Date of Birth/Sex: Treating RN: 06/19/1980 (42 y.o. Judie Petit) Yevonne Pax Primary Care Ikeem Cleckler: [name] Other Clinician: Referring Jenniah Bhavsar: Treating Jakira Mcfadden/Extender: Robbie Louis in Treatment: 4 Clinic Level of Care  Assessment Items TOOL 1 Quantity Score []  - 0 Use when EandM and Procedure is performed on INITIAL visit ASSESSMENTS - Nursing Assessment / Reassessment []  - 0 General Physical Exam (combine w/ comprehensive assessment (listed just below) when performed on new pt. evals) []  - 0 Comprehensive Assessment (HX, ROS, Risk Assessments, Wounds Hx, etc.) Batch, Jacen ([id number]) [id number] Page 2 of 4 ASSESSMENTS - Wound and Skin Assessment / Reassessment []  - 0 Dermatologic / Skin Assessment (not related to wound area) ASSESSMENTS - Ostomy and/or Continence Assessment and Care []  - 0 Incontinence Assessment and Management []  - 0 Ostomy Care Assessment and Management (repouching, etc.) PROCESS - Coordination of Care []  - 0 Simple Patient / Family Education for ongoing care []  - 0 Complex (extensive) Patient / Family Education for ongoing care []  - 0 Staff obtains Chiropractor, Records, T Results / Process Orders est []  - 0 Staff telephones HHA, Nursing Homes / Clarify orders / etc []  - 0 Routine Transfer to another Facility (non-emergent condition) []  - 0 Routine Hospital Admission (non-emergent condition) []  - 0 New Admissions / Manufacturing engineer / Ordering NPWT Apligraf, etc. , []  - 0 Emergency Hospital Admission (emergent condition) PROCESS - Special Needs []  - 0 Pediatric / Minor Patient Management []  - 0 Isolation Patient Management []  - 0 Hearing / Language / Visual special needs []  - 0 Assessment of Community assistance (transportation, D/C planning, etc.) []  - 0 Additional assistance / Altered mentation []  - 0 Support Surface(s) Assessment (bed, cushion, seat, etc.) INTERVENTIONS - Miscellaneous []  - 0 External ear exam []  - 0 Patient Transfer (multiple staff / Nurse, adult / Similar devices) []  - 0 Simple Staple / Suture removal (25 or less) []  - 0 Complex Staple / Suture removal (26 or more) []  - 0 Hypo/Hyperglycemic  Management (do not check if billed separately) []  - 0 Ankle / Brachial Index (ABI) - do not check if billed separately Has the patient been seen  at the hospital within the last three years: Yes Total Score: 0 Level Of Care: ____ Electronic Signature(s) Unsigned Entered By: Yevonne Pax on 04/23/2023 06:59:40 -------------------------------------------------------------------------------- Compression Therapy Details Patient Name: Date of Service: [place], [name] 04/23/2023 9:30 A M Medical Record Number: [id number] Patient Account Number: [id number] Date of Birth/Sex: Treating RN: 1980/11/11 (41 y.o. Melonie [place] Primary Care Ifeanyichukwu Wickham: [name] Other Clinician: [name] ([id number]) [id number] Page 3 of 4 Referring Errick Salts: Treating Chigozie Basaldua/Extender: Robbie Louis in Treatment: 4 Compression Therapy Performed for Wound Assessment: Wound #4 Left,Lateral Lower Leg Performed By: Clinician Yevonne Pax, RN Compression Type: Double Layer Electronic Signature(s) Signed: 04/23/2023 9:58:51 AM By: Yevonne Pax RN Entered By: Yevonne Pax on 04/23/2023 06:58:51 -------------------------------------------------------------------------------- Encounter Discharge Information Details Patient Name: Date of Service: [name], [name] 04/23/2023 9:30 A M Medical Record Number: [id number] Patient Account Number: [id number] Date of Birth/Sex: Treating RN: [birth year] (42 y.o. Melonie [place] Primary Care Alondra Vandeven: [name] Other Clinician: Referring Solaris Kram: Treating Amara Manalang/Extender: Robbie Louis in Treatment: 4 Encounter Discharge Information Items Discharge Condition: Stable Ambulatory Status: Ambulatory Discharge Destination: Home Transportation: Private Auto Accompanied By: self Schedule Follow-up Appointment: Yes Clinical Summary of Care: Electronic Signature(s) Signed: 04/23/2023 9:59:33 AM By:  Yevonne Pax RN Entered By: Yevonne Pax on 04/23/2023 06:59:33 -------------------------------------------------------------------------------- Wound Assessment Details Patient Name: Date of Service: [name], [name] 04/23/2023 9:30 A M Medical Record Number: [id number] Patient Account Number: [id number] Date of Birth/Sex: Treating RN: [birth year] (42 y.o. Melonie [place] Primary Care Eon Zunker: [name] Other Clinician: Referring Florabelle Cardin: Treating Aviyon Hocevar/Extender: Robbie Louis in Treatment: 4 Wound Status Wound Number: 4 Primary Etiology: Pyoderma Wound Location: Left, Lateral Lower Leg Wound Status: Open Wounding Event: Gradually Appeared Comorbid History: Sleep Apnea, Hypertension, Colitis, Type II Diabetes Date Acquired: 03/13/2023 CRISTAIN, GALENO ([id number]) [id number] Page 4 of 4 Weeks Of Treatment: 4 Clustered Wound: Yes Wound Measurements Length: (cm) Width: (cm) Depth: (cm) Clustered Quantity: Area: (cm) Volume: (cm) 1.6 % Reduction in Area: 42.1% 0.8 % Reduction in Volume: 70.9% 0.1 Epithelialization: Small (1-33%) 2 Tunneling: No 1.005 Undermining: No 0.101 Wound Description Classification: Full Thickness Without Exposed Supp Exudate Amount: Medium Exudate Type: Serosanguineous Exudate Color: red, brown ort Structures Foul Odor After Cleansing: No Slough/Fibrino Yes Wound Bed Granulation Amount: Medium (34-66%) Exposed Structure Granulation Quality: Red, Pink Fat Layer (Subcutaneous Tissue) Exposed: Yes Necrotic Amount: Medium (34-66%) Necrotic Quality: Adherent Slough Treatment Notes Wound #4 (Lower Leg) Wound Laterality: Left, Lateral Cleanser Soap and Water Discharge Instruction: Gently cleanse wound with antibacterial soap, rinse and pat dry prior to dressing wounds Wound Cleanser Discharge Instruction: Wash your hands with soap and water. Remove old dressing, discard into plastic bag and place  into trash. Cleanse the wound with Wound Cleanser prior to applying a clean dressing using gauze sponges, not tissues or cotton balls. Do not scrub or use excessive force. Pat dry using gauze sponges, not tissue or cotton balls. Peri-Wound Care Triamcinolone Acetonide Cream, 0.1%, 15 (g) tube Discharge Instruction: to leg for itching Apply as directed. Topical Clobetasol Propionate ointment 0.05%, 60 (g) tube Discharge Instruction: apply to periwound Primary Dressing Promogran Matrix 4.34 (in) Discharge Instruction: Moisten w/normal saline or sterile water; Cover wound as directed. Do not remove from wound bed. Secondary Dressing Zetuvit Plus 4x4 (in/in) Secured With Compression Wrap Urgo K2, two layer compression system, large Compression Stockings Add-Ons Electronic Signature(s) Signed: 04/23/2023 9:58:31 AM By: Yevonne Pax RN Entered By: Yevonne Pax  on 04/23/2023 06:58:31

## 2023-04-24 NOTE — Progress Notes (Signed)
SADIEL, PANAMENO ([id number]) [id number] Page 1 of 2 Visit Report for 04/23/2023 Physician Orders Details Patient Name: Date of Service: [name], [name] 04/23/2023 9:30 A M Medical Record Number: [id number] Patient Account Number: [id number] Date of Birth/Sex: Treating RN: Feb 02, 1981 (42 y.o. [name]) [name] Primary Care Provider: Vernona [name] Other Clinician: Referring Provider: Treating Provider/Extender: [name] in Treatment: 4 The following information was scribed by: [name] The information was scribed for: [name] Verbal / Phone Orders: No Diagnosis Coding Follow-up Appointments Return Appointment in 1 week. Nurse Visit as needed - PRN Bathing/ Shower/ Hygiene May shower with wound dressing protected with water repellent cover or cast protector. No tub bath. Anesthetic (Use 'Patient Medications' Section for Anesthetic Order Entry) Lidocaine applied to wound bed Edema Control - Orders / Instructions Elevate, Exercise Daily and A void Standing for Long Periods of Time. Elevate legs to the level of the heart and pump ankles as often as possible Elevate leg(s) parallel to the floor when sitting. DO YOUR BEST to sleep in the bed at night. DO NOT sleep in your recliner. Long hours of sitting in a recliner leads to swelling of the legs and/or potential wounds on your backside. Non-Wound Condition dditional non-wound orders/instructions: - nystatin cream to left foot/toes for irritation A Wound Treatment Wound #4 - Lower Leg Wound Laterality: Left, Lateral Cleanser: Soap and Water 1 x Per Week/15 Days Discharge Instructions: Gently cleanse wound with antibacterial soap, rinse and pat dry prior to dressing wounds Cleanser: Wound Cleanser 1 x Per Week/15 Days Discharge Instructions: Wash your hands with soap and water. Remove old dressing, discard into plastic bag and place into trash. Cleanse the wound with Wound  Cleanser prior to applying a clean dressing using gauze sponges, not tissues or cotton balls. Do not scrub or use excessive force. Pat dry using gauze sponges, not tissue or cotton balls. Peri-Wound Care: Triamcinolone Acetonide Cream, 0.1%, 15 (g) tube 1 x Per Week/15 Days Discharge Instructions: to leg for itching Apply as directed. Topical: Clobetasol Propionate ointment 0.05%, 60 (g) tube 1 x Per Week/15 Days Discharge Instructions: apply to periwound Prim Dressing: Promogran Matrix 4.34 (in) 1 x Per Week/15 Days ary Discharge Instructions: Moisten w/normal saline or sterile water; Cover wound as directed. Do not remove from wound bed. Secondary Dressing: Zetuvit Plus 4x4 (in/in) 1 x Per Week/15 Days Compression Wrap: Urgo K2, two layer compression system, large 1 x Per Week/15 Days Electronic Signature(s) Signed: 04/23/2023 9:31:24 AM By: [name] Pax RN [name], [name] ([id number]) [id number] Page 2 of 2 Signed: 04/24/2023 7:39:18 AM By: [name] Derry PA-C Entered By: [name] on 04/23/2023 09:31:24 -------------------------------------------------------------------------------- SuperBill Details Patient Name: Date of Service: [name], [name] 04/23/2023 Medical Record Number: [id number] Patient Account Number: [id number] Date of Birth/Sex: Treating RN: 07-04-1980 (42 y.o. 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

## 2023-04-29 ENCOUNTER — Encounter: Payer: 59 | Admitting: Physician Assistant

## 2023-04-29 DIAGNOSIS — I87332 Chronic venous hypertension (idiopathic) with ulcer and inflammation of left lower extremity: Secondary | ICD-10-CM | POA: Diagnosis not present

## 2023-05-01 NOTE — Progress Notes (Signed)
[name], [name] ([id number]) [id number] Page 1 of 8 Visit Report for 04/29/2023 Chief Complaint Document Details Patient Name: Date of Service: [name] [name] 04/29/2023 7:30 A M Medical Record Number: [id number] Patient Account Number: [id number] Date of Birth/Sex: Treating RN: [birth year] (42 y.o. [name] Primary Care Provider: Vernona [name] Other Clinician: Referring Provider: Treating Provider/Extender: [name] in Treatment: 5 Information Obtained from: Patient Chief Complaint LLE pyoderma ulcer Electronic Signature(s) Signed: 04/29/2023 8:26:33 AM By: [name] Derry PA-C Entered By: [name] on 04/29/2023 08:26:32 -------------------------------------------------------------------------------- Debridement Details Patient Name: Date of Service: [name], [name] 04/29/2023 7:30 A M Medical Record Number: [id number] Patient Account Number: [id number] Date of Birth/Sex: Treating RN: 04/07/1981 (42 y.o. [name] Primary Care Provider: Vernona [name] Other Clinician: Referring Provider: Treating Provider/Extender: [name] in Treatment: 5 Debridement Performed for Assessment: Wound #4 Left,Lateral Lower Leg Performed By: Physician [name] Derry, PA-C The following information was scribed by: [name] The information was scribed for: [name] Debridement Type: Debridement Level of Consciousness (Pre-procedure): Awake and Alert Pre-procedure Verification/Time Out Yes - 08:04 Taken: Pain Control: Lidocaine 4% T opical Solution Percent of Wound Bed Debrided: 100% T Area Debrided (cm): otal 0.14 Tissue and other material debrided: Viable, Non-Viable, Skin: Dermis , Skin: Epidermis Level: Skin/Epidermis Debridement Description: Selective/Open Wound Instrument: Curette Bleeding: None Response to Treatment: Procedure was tolerated well Level of Consciousness (Post- Awake  and Alert procedure): [name] ([id number]) [id number] Page 2 of 8 Post Debridement Measurements of Total Wound Length: (cm) 0.6 Width: (cm) 0.3 Depth: (cm) 0.1 Volume: (cm) 0.014 Character of Wound/Ulcer Post Debridement: Stable Post Procedure Diagnosis Same as Pre-procedure Electronic Signature(s) Signed: 04/29/2023 4:48:05 PM By: [name] Signed: 04/30/2023 7:54:12 PM By: [name] Derry PA-C Entered By: [name] on 04/29/2023 08:06:17 -------------------------------------------------------------------------------- HPI Details Patient Name: Date of Service: [name], [name] 04/29/2023 7:30 A M Medical Record Number: [id number] Patient Account Number: [id number] Date of Birth/Sex: Treating RN: Mar 11, 1981 (42 y.o. [name] Primary Care Provider: Vernona [name] Other Clinician: Referring Provider: Treating Provider/Extender: [name] in Treatment: 5 History of Present Illness HPI Description: 12/04/16; 42 year old man who comes into the clinic today for review of a wound on the posterior left calf. He tells me that is been there for about a year. He is not a diabetic he does smoke half a pack per day. He was seen in the ER on 11/20/16 felt to have cellulitis around the wound and was given clindamycin. An x-ray did not show osteomyelitis. The patient initially tells me that he has a milk allergy that sets off a pruritic itching rash on his lower legs which she scratches incessantly and he thinks that's what may have set up the wound. He has been using various topical antibiotics and ointments without any effect. He works in a [employer]ing [employer] and is on his feet all day. He does not have a prior history of wounds however he does have the rash on both lower legs the right arm and the ventral aspect of his left arm. These are excoriations and clearly have had scratching however there are of macular looking areas  on both legs including a substantial larger area on the right leg. This does not have an underlying open area. There is no blistering. The patient tells me that 2 years ago in [place] in response to the rash on his legs he saw a dermatologist who told him he had  a condition which may be pyoderma gangrenosum although I may be putting words into his mouth. He seemed to recognize this. On further questioning he admits to a 5 year history of quiesced. ulcerative colitis. He is not in any treatment for this. He's had no recent travel 04/02/17; the patient was seen last week by [name] at [name] locally who kindly saw him at my request. 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 My concern was the degree of erythema medially extending from his left leg wound which was either inflammation from pyoderma or cellulitis. I put him on Augmentin however culture of the wound showed Pseudomonas which is quinolone sensitive. I really don't believe he has cellulitis however in view of everything I will continue and give him a course of Cipro. He is also on doxycycline as an immune modulator for the pyoderma. In addition to his original wound on the left lateral leg with surrounding erythema he has a wound on the right posterior calf which was an original biopsy site done by dermatology. This was felt to represent pathergy from pyoderma gangrenosum Readmission: 03-25-2023 patient presents today for readmission here in the clinic concerning issues he is having with a wound in the same area that we previously took care of for him. He tells me that around 24 October he had an area that opened and he just has not been able to get the close he thought it might be best to come back here as opposed to try to do it himself and letting it get worse. He continues with the Remicade infusions every 6 weeks that really has not changed. His past medical  history also has not changed significantly from what I saw him last. He was discharged October 2023. 04-01-2023 upon evaluation today patient appears to be doing well currently in regard to his wound there is no signs of infection I think that the film and biofilm on the surface of the wound went right off there was no need for sharp debridement with this being pyoderma he does not allow for debridement anyways. Fortunately I do think that he is making really good progress here towards closure however and I feel like that the patient is doing quite well already and its only been 1 week. The wrap did start to slide on Sunday he took it off and use his own compression he did a very good job keeping this under control his leg is definitely smaller this week compared to last week. 04-08-2023 upon evaluation today patient appears to be doing excellent in regard to his wound he is already showing signs of new granulation growth which is great news and I am very pleased in that regard. Fortunately I do not see any signs of active infection at this time. No fevers, chills, nausea, vomiting, or diarrhea. 04-15-2023 upon evaluation patient appears to be doing well currently in regard to his wound. He is actually showing signs of improvement and I feel like that MARTINJR, LUCKER ([id number]) [id number] Page 3 of 8 this is making headway towards closure fairly rapidly this is great news. 04-29-2023 upon evaluation today patient appears to be doing well currently in regard to his wound which is actually showing signs of being very close to complete resolution. Fortunately I do not see any signs of infection and I think he is doing quite well. Electronic Signature(s) Signed: 04/30/2023 7:45:01 PM By: [name] Derry PA-C Entered By: [name]  on 04/30/2023 19:45:01 -------------------------------------------------------------------------------- Physical Exam Details Patient Name: Date of  Service: Harvey, [name] [name] 04/29/2023 7:30 A M Medical Record Number: [id number] Patient Account Number: [id number] Date of Birth/Sex: Treating RN: 01/05/1981 (42 y.o. [name] Primary Care Provider: Vernona [name] Other Clinician: Referring Provider: Treating Provider/Extender: [name] in Treatment: 5 Constitutional Obese and well-hydrated in no acute distress. Respiratory normal breathing without difficulty. Psychiatric this patient is able to make decisions and demonstrates good insight into disease process. Alert and Oriented x 3. pleasant and cooperative. Notes Upon inspection patient's wound bed actually showed signs of good granulation and epithelization at this point. He has a very tiny wound remaining open at this time and in general I think we are very close to complete resolution which is great news. Electronic Signature(s) Signed: 04/30/2023 7:45:14 PM By: [name] Derry PA-C Entered By: [name] on 04/30/2023 19:45:14 -------------------------------------------------------------------------------- Physician Orders Details Patient Name: Date of Service: [name], [name] 04/29/2023 7:30 A M Medical Record Number: [id number] Patient Account Number: [id number] Date of Birth/Sex: Treating RN: Oct 17, 1980 (42 y.o. [name] Primary Care Provider: Vernona [name] Other Clinician: Referring Provider: Treating Provider/Extender: [name] in Treatment: 5 The following information was scribed by: [name] The information was scribed for: [name] Verbal / Phone Orders: No [name], [name] ([id number]) [id number] Page 4 of 8 Diagnosis Coding Follow-up Appointments Return Appointment in 1 week. Nurse Visit as needed - PRN Bathing/ Shower/ Hygiene May shower with wound dressing protected with water repellent cover or cast protector. No tub bath. Anesthetic (Use 'Patient  Medications' Section for Anesthetic Order Entry) Lidocaine applied to wound bed Edema Control - Orders / Instructions Elevate, Exercise Daily and A void Standing for Long Periods of Time. Elevate legs to the level of the heart and pump ankles as often as possible Elevate leg(s) parallel to the floor when sitting. DO YOUR BEST to sleep in the bed at night. DO NOT sleep in your recliner. Long hours of sitting in a recliner leads to swelling of the legs and/or potential wounds on your backside. Non-Wound Condition dditional non-wound orders/instructions: - nystatin cream to left foot/toes for irritation A Wound Treatment Wound #4 - Lower Leg Wound Laterality: Left, Lateral Cleanser: Soap and Water 1 x Per Week/15 Days Discharge Instructions: Gently cleanse wound with antibacterial soap, rinse and pat dry prior to dressing wounds Cleanser: Wound Cleanser 1 x Per Week/15 Days Discharge Instructions: Wash your hands with soap and water. Remove old dressing, discard into plastic bag and place into trash. Cleanse the wound with Wound Cleanser prior to applying a clean dressing using gauze sponges, not tissues or cotton balls. Do not scrub or use excessive force. Pat dry using gauze sponges, not tissue or cotton balls. Peri-Wound Care: Triamcinolone Acetonide Cream, 0.1%, 15 (g) tube 1 x Per Week/15 Days Discharge Instructions: to leg for itching Apply as directed. Topical: Clobetasol Propionate ointment 0.05%, 60 (g) tube 1 x Per Week/15 Days Discharge Instructions: apply to periwound Prim Dressing: Promogran Matrix 4.34 (in) 1 x Per Week/15 Days ary Discharge Instructions: Moisten w/normal saline or sterile water; Cover wound as directed. Do not remove from wound bed. Secondary Dressing: Zetuvit Plus 4x4 (in/in) 1 x Per Week/15 Days Compression Wrap: Urgo K2, two layer compression system, large 1 x Per Week/15 Days Electronic Signature(s) Signed: 04/29/2023 4:48:05 PM By: [name] Signed: 04/30/2023 7:54:12 PM By: [name] Derry PA-C Entered By: [name] on 04/29/2023 12:32:07 --------------------------------------------------------------------------------  Problem List Details Patient Name: Date of ServiceVerita [name], [name] [name] 04/29/2023 7:30 A M Medical Record Number: [id number] Patient Account Number: [id number] Date of Birth/Sex: Treating RN: 06/27/1980 (42 y.o. [name] Primary Care Provider: Vernona [name] Other Clinician: Referring Provider: Treating Provider/Extender: [name] in Treatment: 5 Active Problems [name], [name] Maduro ([id number]) [id number] Page 5 of 8 ICD-10 Encounter Code Description Active Date MDM Diagnosis I87.332 Chronic venous hypertension (idiopathic) with ulcer and inflammation of left 03/25/2023 No Yes lower extremity L97.222 Non-pressure chronic ulcer of left calf with fat layer exposed 03/25/2023 No Yes E11.622 Type 2 diabetes mellitus with other skin ulcer 03/25/2023 No Yes L88 Pyoderma gangrenosum 03/25/2023 No Yes F17.208 Nicotine dependence, unspecified, with other nicotine-induced disorders 03/25/2023 No Yes Inactive Problems Resolved Problems Electronic Signature(s) Signed: 04/29/2023 8:26:26 AM By: [name] Derry PA-C Entered By: [name] on 04/29/2023 08:26:26 -------------------------------------------------------------------------------- Progress Note Details Patient Name: Date of Service: [name], [name] 04/29/2023 7:30 A M Medical Record Number: [id number] Patient Account Number: [id number] Date of Birth/Sex: Treating RN: 1980/06/08 (42 y.o. [name] Primary Care Provider: Vernona [name] Other Clinician: Referring Provider: Treating Provider/Extender: [name] in Treatment: 5 Subjective Chief Complaint Information obtained from Patient LLE pyoderma ulcer History of Present Illness (HPI) 12/04/16; 42 year old  man who comes into the clinic today for review of a wound on the posterior left calf. He tells me that is been there for about a year. He is not a diabetic he does smoke half a pack per day. He was seen in the ER on 11/20/16 felt to have cellulitis around the wound and was given clindamycin. An x- ray did not show osteomyelitis. The patient initially tells me that he has a milk allergy that sets off a pruritic itching rash on his lower legs which she scratches incessantly and he thinks that's what may have set up the wound. He has been using various topical antibiotics and ointments without any effect. He works in a [employer]ing [employer] and is on his feet all day. He does not have a prior history of wounds however he does have the rash on both lower legs the right arm and the ventral aspect of his left arm. These are excoriations and clearly have had scratching however there are of macular looking areas on both legs including a substantial larger area on the right leg. This does not have an underlying open area. There is no blistering. 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 He seemed to recognize this. On further questioning he admits to a 5 year history of quiesced. ulcerative colitis. He is not in any treatment for this. He's had no recent travel 04/02/17; the patient was seen last week by [name] at [facility] dermat[facility] locally who kindly saw him at my request. 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 My concern was the degree of erythema medially extending from his left [name], [name] ([id number]) [id number] Page 6 of 8 leg wound which was either inflammation from pyoderma or cellulitis. I put him on Augmentin however culture of the  wound showed Pseudomonas which is quinolone sensitive. I really don't believe he has cellulitis however in view of everything I will continue and give him  a course of Cipro. He is also on doxycycline as an immune modulator for the pyoderma. In addition to his original wound on the left lateral leg with surrounding erythema he has a wound on the right posterior calf which was an original biopsy site done by dermatology. This was felt to represent pathergy from pyoderma gangrenosum Readmission: 03-25-2023 patient presents today for readmission here in the clinic concerning issues he is having with a wound in the same area that we previously took care of for him. He tells me that around 24 October he had an area that opened and he just has not been able to get the close he thought it might be best to come back here as opposed to try to do it himself and letting it get worse. He continues with the Remicade infusions every 6 weeks that really has not changed. His past medical history also has not changed significantly from what I saw him last. He was discharged October 2023. 04-01-2023 upon evaluation today patient appears to be doing well currently in regard to his wound there is no signs of infection I think that the film and biofilm on the surface of the wound went right off there was no need for sharp debridement with this being pyoderma he does not allow for debridement anyways. Fortunately I do think that he is making really good progress here towards closure however and I feel like that the patient is doing quite well already and its only been 1 week. The wrap did start to slide on Sunday he took it off and use his own compression he did a very good job keeping this under control his leg is definitely smaller this week compared to last week. 04-08-2023 upon evaluation today patient appears to be doing excellent in regard to his wound he is already showing signs of new granulation growth which  is great news and I am very pleased in that regard. Fortunately I do not see any signs of active infection at this time. No fevers, chills, nausea, vomiting, or diarrhea. 04-15-2023 upon evaluation patient appears to be doing well currently in regard to his wound. He is actually showing signs of improvement and I feel like that this is making headway towards closure fairly rapidly this is great news. 04-29-2023 upon evaluation today patient appears to be doing well currently in regard to his wound which is actually showing signs of being very close to complete resolution. Fortunately I do not see any signs of infection and I think he is doing quite well. Objective Constitutional Obese and well-hydrated in no acute distress. Vitals Time Taken: 7:35 AM, Height: 70 in, Weight: 329 lbs, BMI: 47.2, Temperature: 97.7 F, Pulse: 96 bpm, Respiratory Rate: 18 breaths/min, Blood Pressure: 121/80 mmHg. Respiratory normal breathing without difficulty. Psychiatric this patient is able to make decisions and demonstrates good insight into disease process. Alert and Oriented x 3. pleasant and cooperative. General Notes: Upon inspection patient's wound bed actually showed signs of good granulation and epithelization at this point. He has a very tiny wound remaining open at this time and in general I think we are very close to complete resolution which is great news. Integumentary (Hair, Skin) Wound #4 status is Open. Original cause of wound was Gradually Appeared. The date acquired was: 03/13/2023. The wound has been in treatment 5 weeks. The wound is located on the Left,Lateral Lower Leg. The wound measures 0.6cm length x 0.3cm width x 0.1cm depth; 0.141cm^2 area and 0.014cm^3 volume. There  is Fat Layer (Subcutaneous Tissue) exposed. There is no tunneling or undermining noted. There is a medium amount of serosanguineous drainage noted. There is medium (34-66%) red, pink granulation within the wound bed. There  is a medium (34-66%) amount of necrotic tissue within the wound bed including Adherent Slough. 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 There was a Selective/Open Wound Skin/Epidermis Debridement with a total area of 0.14 sq cm performed by [name] Derry, PA-C. With the following instrument(s): Curette to remove Viable and Non-Viable tissue/material. [name], [name] ([id number]) [id number] Page 7 of 8 Material removed includes Skin: Dermis and Skin: Epidermis and after achieving pain control using Lidocaine 4% T opical Solution. No specimens were taken. A time out was conducted at 08:04, prior to the start of the procedure. There was no bleeding. The procedure was tolerated well. Post Debridement Measurements: 0.6cm length x 0.3cm width x 0.1cm depth; 0.014cm^3 volume. Character of Wound/Ulcer Post Debridement is stable. Post procedure Diagnosis Wound #4: Same as Pre-Procedure Plan Follow-up Appointments: Return Appointment in 1 week. Nurse Visit as needed - PRN Bathing/ Shower/ Hygiene: May shower with wound dressing protected with water repellent cover or cast protector. No tub bath. Anesthetic (Use 'Patient Medications' Section for Anesthetic Order Entry): Lidocaine applied to wound bed Edema Control - Orders / Instructions: Elevate, Exercise Daily and Avoid Standing for Long Periods of Time. Elevate legs to the level of the heart and pump ankles as often as possible Elevate leg(s) parallel to the floor when sitting. DO YOUR BEST to sleep in the bed at night. DO NOT  sleep in your recliner. Long hours of sitting in a recliner leads to swelling of the legs and/or potential wounds on your backside. Non-Wound Condition: Additional non-wound orders/instructions: - nystatin cream to left foot/toes for irritation WOUND #4: - Lower Leg Wound Laterality: Left, Lateral Cleanser: Soap and Water 1 x Per Week/15 Days Discharge Instructions: Gently cleanse wound with antibacterial soap, rinse and pat dry prior to dressing wounds Cleanser: Wound Cleanser 1 x Per Week/15 Days Discharge Instructions: Wash your hands with soap and water. Remove old dressing, discard into plastic bag and place into trash. Cleanse the wound with Wound Cleanser prior to applying a clean dressing using gauze sponges, not tissues or cotton balls. Do not scrub or use excessive force. Pat dry using gauze sponges, not tissue or cotton balls. Peri-Wound Care: Triamcinolone Acetonide Cream, 0.1%, 15 (g) tube 1 x Per Week/15 Days Discharge Instructions: to leg for itching Apply as directed. Topical: Clobetasol Propionate ointment 0.05%, 60 (g) tube 1 x Per Week/15 Days Discharge Instructions: apply to periwound Prim Dressing: Promogran Matrix 4.34 (in) 1 x Per Week/15 Days ary Discharge Instructions: Moisten w/normal saline or sterile water; Cover wound as directed. Do not remove from wound bed. Secondary Dressing: Zetuvit Plus 4x4 (in/in) 1 x Per Week/15 Days Com pression Wrap: Urgo K2, two layer compression system, large 1 x Per Week/15 Days 1. I would recommend that we going continue to monitor for any signs of infection or worsening anything changes he knows contact the office and let me know. 2. I am going to recommend as well that he should continue specifically with the clobetasol  to the wound and they were using triamcinolone on the leg in general. Were also going to using the collagen to the center part of the wound which is open which seems to be doing very well. 3. We can continue with  Urgo K2 compression wrap which has done very well for him. We will see patient back for reevaluation in 1 week here in the clinic. If anything worsens or changes patient will contact our office for additional recommendations. Electronic Signature(s) Signed: 04/30/2023 7:45:51 PM By: [name] Derry PA-C Entered By: [name] on 04/30/2023 19:45:51 -------------------------------------------------------------------------------- SuperBill Details Patient Name: Date of Service: [name], [name] 04/29/2023 Medical Record Number: [id number] Patient Account Number: [id number] Date of Birth/Sex: Treating RN: 12-17-1980 (42 y.o. 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 Nicotine dependence, unspecified, with other nicotine-induced disorders Facility Procedures : CPT4 Code: [id number] Description: 97597 - DEBRIDE WOUND 1ST 20 SQ CM OR < ICD-10 Diagnosis Description L97.222 Non-pressure chronic ulcer of left calf with fat layer exposed Modifier: Quantity: 1 Physician Procedures : CPT4 Code Description Modifier [id number] 97597 - WC PHYS DEBR WO ANESTH 20 SQ CM ICD-10 Diagnosis Description L97.222 Non-pressure chronic ulcer of left calf with fat layer exposed Quantity: 1 Electronic Signature(s) Signed: 04/30/2023 7:49:05 PM By: [name] Derry PA-C Entered By: [name] on 04/30/2023 19:49:05

## 2023-05-06 ENCOUNTER — Encounter: Payer: 59 | Admitting: Physician Assistant

## 2023-05-06 DIAGNOSIS — I87332 Chronic venous hypertension (idiopathic) with ulcer and inflammation of left lower extremity: Secondary | ICD-10-CM | POA: Diagnosis not present

## 2023-05-10 NOTE — Progress Notes (Signed)
BURON, MARINOFF ([id number]) [id number] Page 1 of 9 Visit Report for 05/06/2023 Arrival Information Details Patient Name: Date of Service: [place], [name] [name] 05/06/2023 7:30 A M Medical Record Number: [id number] Patient Account Number: [id number] Date of Birth/Sex: Treating RN: 19-Feb-1981 (42 y.o. [name] Primary Care Rosabell Geyer: [name] Other Clinician: Referring Stephone Gum: Treating Posey Petrik/Extender: Robbie Louis in Treatment: 6 Visit Information History Since Last Visit Added or deleted any medications: No Patient Arrived: Ambulatory Any new allergies or adverse reactions: No Arrival Time: 07:40 Had a fall or experienced change in No Accompanied By: self activities of daily living that may affect Transfer Assistance: None risk of falls: Patient Identification Verified: Yes Hospitalized since last visit: No Secondary Verification Process Completed: Yes Has Dressing in Place as Prescribed: Yes Patient Requires Transmission-Based Precautions: No Has Compression in Place as Prescribed: Yes Patient Has Alerts: No Pain Present Now: No Electronic Signature(s) Signed: 05/06/2023 7:56:11 AM By: Angelina Pih Entered By: Angelina Pih on 05/06/2023 04:56:11 -------------------------------------------------------------------------------- Clinic Level of Care Assessment Details Patient Name: Date of Service: [name], [place] [name] 05/06/2023 7:30 A M Medical Record Number: [id number] Patient Account Number: [id number] Date of Birth/Sex: Treating RN: 1980-08-21 (42 y.o. [name] Primary Care Aalijah Mims: [name] Other Clinician: Referring Luisfernando Brightwell: Treating Everett Ricciardelli/Extender: Robbie Louis in Treatment: 6 Clinic Level of Care Assessment Items TOOL 1 Quantity Score []  - 0 Use when EandM and Procedure is performed on INITIAL visit ASSESSMENTS - Nursing Assessment / Reassessment []  -  0 General Physical Exam (combine w/ comprehensive assessment (listed just below) when performed on new pt. evals) []  - 0 Comprehensive Assessment (HX, ROS, Risk Assessments, Wounds Hx, etc.) ASSESSMENTS - Wound and Skin Assessment / Reassessment []  - 0 Dermatologic / Skin Assessment (not related to wound area) Goucher, Santiel ([id number]) [id number] Page 2 of 9 ASSESSMENTS - Ostomy and/or Continence Assessment and Care []  - 0 Incontinence Assessment and Management []  - 0 Ostomy Care Assessment and Management (repouching, etc.) PROCESS - Coordination of Care []  - 0 Simple Patient / Family Education for ongoing care []  - 0 Complex (extensive) Patient / Family Education for ongoing care []  - 0 Staff obtains Chiropractor, Records, T Results / Process Orders est []  - 0 Staff telephones HHA, Nursing Homes / Clarify orders / etc []  - 0 Routine Transfer to another Facility (non-emergent condition) []  - 0 Routine Hospital Admission (non-emergent condition) []  - 0 New Admissions / Manufacturing engineer / Ordering NPWT Apligraf, etc. , []  - 0 Emergency Hospital Admission (emergent condition) PROCESS - Special Needs []  - 0 Pediatric / Minor Patient Management []  - 0 Isolation Patient Management []  - 0 Hearing / Language / Visual special needs []  - 0 Assessment of Community assistance (transportation, D/C planning, etc.) []  - 0 Additional assistance / Altered mentation []  - 0 Support Surface(s) Assessment (bed, cushion, seat, etc.) INTERVENTIONS - Miscellaneous []  - 0 External ear exam []  - 0 Patient Transfer (multiple staff / Nurse, adult / Similar devices) []  - 0 Simple Staple / Suture removal (25 or less) []  - 0 Complex Staple / Suture removal (26 or more) []  - 0 Hypo/Hyperglycemic Management (do not check if billed separately) []  - 0 Ankle / Brachial Index (ABI) - do not check if billed separately Has the patient been seen at the hospital within  the last three years: Yes Total Score: 0 Level Of Care: ____ Electronic Signature(s) Signed: 05/06/2023 3:54:48 PM By: Angelina Pih Entered By: Angelina Pih  on 05/06/2023 05:17:49 -------------------------------------------------------------------------------- Compression Therapy Details Patient Name: Date of Service: [place], [name] [name] 05/06/2023 7:30 A M Medical Record Number: [id number] Patient Account Number: [id number] Date of Birth/Sex: Treating RN: 03-Jul-1980 (42 y.o. [name] Primary Care Ovella Manygoats: [name] Other Clinician: Referring Jamee Keach: Treating Milfred Krammes/Extender: Robbie Louis in Treatment: 6 Compression Therapy Performed for Wound Assessment: Wound #4 Left,Lateral Lower Leg Performed By: Holly Bodily, RN Compression TypeThurmond Butts ([id number]) [id number] Page 3 of 9 Post Procedure Diagnosis Same as Pre-procedure Electronic Signature(s) Signed: 05/06/2023 3:54:48 PM By: Angelina Pih Entered By: Angelina Pih on 05/06/2023 05:03:54 -------------------------------------------------------------------------------- Encounter Discharge Information Details Patient Name: Date of Service: [name], [place] [name] 05/06/2023 7:30 A M Medical Record Number: [id number] Patient Account Number: [id number] Date of Birth/Sex: Treating RN: 1980-06-29 (42 y.o. [name] Primary Care Jameal Razzano: [name] Other Clinician: Referring Marionette Meskill: Treating Tishina Lown/Extender: Robbie Louis in Treatment: 6 Encounter Discharge Information Items Post Procedure Vitals Discharge Condition: Stable Temperature (F): 97.7 Ambulatory Status: Ambulatory Pulse (bpm): 80 Discharge Destination: Home Respiratory Rate (breaths/min): 18 Transportation: Private Auto Blood Pressure (mmHg): 126/85 Accompanied By: self Schedule Follow-up Appointment: Yes Clinical Summary of  Care: Electronic Signature(s) Signed: 05/06/2023 8:57:17 AM By: Angelina Pih Entered By: Angelina Pih on 05/06/2023 05:57:17 -------------------------------------------------------------------------------- Lower Extremity Assessment Details Patient Name: Date of Service: [name], [place] [name] 05/06/2023 7:30 A M Medical Record Number: [id number] Patient Account Number: [id number] Date of Birth/Sex: Treating RN: 1981/02/17 (42 y.o. [name] Primary Care Daimien Patmon: [name] Other Clinician: Referring [name] Surgeon: Treating Corah Willeford/Extender: Robbie Louis in Treatment: 6 Edema Assessment Assessed: [Left: No] [Right: No] Edema: [Left: N] [Right: o] Calf Neumeyer, [name] ([id number]) [id number] Page 4 of 9 Left: Right: Point of Measurement: 36 cm From Medial Instep 46.5 cm Ankle Left: Right: Point of Measurement: 12 cm From Medial Instep 27.5 cm Vascular Assessment Pulses: Dorsalis Pedis Palpable: [Left:Yes] Extremity colors, hair growth, and conditions: Extremity Color: [Left:Hyperpigmented] Hair Growth on Extremity: [Left:Yes] Temperature of Extremity: [Left:Warm < 3 seconds] Toe Nail Assessment Left: Right: Thick: No Discolored: No Deformed: No Improper Length and Hygiene: No Electronic Signature(s) Signed: 05/06/2023 7:57:33 AM By: Angelina Pih Entered By: Angelina Pih on 05/06/2023 04:57:33 -------------------------------------------------------------------------------- Multi Wound Chart Details Patient Name: Date of Service: [name], [place] [name] 05/06/2023 7:30 A M Medical Record Number: [id number] Patient Account Number: [id number] Date of Birth/Sex: Treating RN: [birth year] (42 y.o. [name] Primary Care Julina Altmann: [name] Other Clinician: Referring Ege Muckey: Treating Brantlee Penn/Extender: Robbie Louis in Treatment: 6 Vital Signs Height(in): 70 Pulse(bpm):  80 Weight(lbs): 329 Blood Pressure(mmHg): 126/85 Body Mass Index(BMI): 47.2 Temperature(F): 97.7 Respiratory Rate(breaths/min): 18 [4:Photos:] [N/A:N/A] Left, Lateral Lower Leg N/A N/A Wound Location: Gradually Appeared N/A N/A Wounding Event: Pyoderma N/A N/A Primary EtiologyNEALY, [name] ([id number]) [id number] Page 5 of 9 Sleep Apnea, Hypertension, Colitis, N/A N/A Comorbid History: Type II Diabetes 03/13/2023 N/A N/A Date Acquired: 6 N/A N/A Weeks of Treatment: Open N/A N/A Wound Status: No N/A N/A Wound Recurrence: Yes N/A N/A Clustered Wound: 2 N/A N/A Clustered Quantity: 0.1x0.1x0.1 N/A N/A Measurements L x W x D (cm) 0.008 N/A N/A A (cm) : rea 0.001 N/A N/A Volume (cm) : 99.50% N/A N/A % Reduction in Area: 99.70% N/A N/A % Reduction in Volume: Full Thickness Without Exposed N/A N/A Classification: Support Structures Medium N/A N/A Exudate A mount: Serosanguineous N/A N/A Exudate Type: red, brown N/A N/A Exudate Color: None  Present (0%) N/A N/A Granulation A mount: None Present (0%) N/A N/A Necrotic A mount: Fat Layer (Subcutaneous Tissue): No N/A N/A Exposed Structures: Large (67-100%) N/A N/A Epithelialization: Treatment Notes Electronic Signature(s) Signed: 05/06/2023 3:54:48 PM By: Angelina Pih Entered By: Angelina Pih on 05/06/2023 05:01:47 -------------------------------------------------------------------------------- Multi-Disciplinary Care Plan Details Patient Name: Date of Service: [name], RO [name] 05/06/2023 7:30 A M Medical Record Number: [id number] Patient Account Number: [id number] Date of Birth/Sex: Treating RN: 04-07-1981 (42 y.o. [name] Primary Care Ivory [name]: [name] Other Clinician: Referring Ashly Yepez: Treating Neda Willenbring/Extender: Robbie Louis in Treatment: 6 Active Inactive Wound/Skin Impairment Nursing Diagnoses: Impaired tissue  integrity Knowledge deficit related to ulceration/compromised skin integrity Goals: Ulcer/skin breakdown will have a volume reduction of 30% by week 4 Date Initiated: 03/25/2023 Date Inactivated: 04/29/2023 Target Resolution Date: 04/22/2023 Goal Status: Met Ulcer/skin breakdown will have a volume reduction of 50% by week 8 Date Initiated: 03/25/2023 Target Resolution Date: 05/20/2023 Goal Status: Active Ulcer/skin breakdown will have a volume reduction of 80% by week 12 Date Initiated: 03/25/2023 Target Resolution Date: 06/17/2023 Goal Status: Active Ulcer/skin breakdown will heal within 14 weeks Date Initiated: 03/25/2023 Target Resolution Date: 07/01/2023 Goal Status: Active Interventions: [name], [name] ([id number]) [phone].pdf Page 6 of 9 Assess patient/caregiver ability to obtain necessary supplies Assess patient/caregiver ability to perform ulcer/skin care regimen upon admission and as needed Assess ulceration(s) every visit Provide education on ulcer and skin care Notes: Electronic Signature(s) Signed: 05/06/2023 8:55:15 AM By: Angelina Pih Entered By: Angelina Pih on 05/06/2023 05:55:14 -------------------------------------------------------------------------------- Pain Assessment Details Patient Name: Date of Service: [name], [place] [name] 05/06/2023 7:30 A M Medical Record Number: [id number] Patient Account Number: [id number] Date of Birth/Sex: Treating RN: 1980-11-19 (42 y.o. [name] Primary Care Chrishauna Mee: [name] Other Clinician: Referring Destin Kittler: Treating Alvah Lagrow/Extender: Robbie Louis in Treatment: 6 Active Problems Location of Pain Severity and Description of Pain Patient Has Paino No Site Locations Pain Management and Medication Current Pain Management: Electronic Signature(s) Signed: 05/06/2023 7:56:47 AM By: Angelina Pih Entered By: Angelina Pih on 05/06/2023 04:56:47 Tan, [name]  ([id number]) [id number] Page 7 of 9 -------------------------------------------------------------------------------- Patient/Caregiver Education Details Patient Name: Date of Service: [place], [name] [name] 12/17/2024andnbsp7:30 A M Medical Record Number: [id number] Patient Account Number: [id number] Date of Birth/Gender: Treating RN: 07-12-1980 (42 y.o. [name] Primary Care Physician: [name] Other Clinician: Referring Physician: Treating Physician/Extender: Robbie Louis in Treatment: 6 Education Assessment Education Provided To: Patient Education Topics Provided Wound/Skin Impairment: Handouts: Caring for Your Ulcer Methods: Explain/Verbal Responses: State content correctly Electronic Signature(s) Signed: 05/06/2023 3:54:48 PM By: Angelina Pih Entered By: Angelina Pih on 05/06/2023 05:55:25 -------------------------------------------------------------------------------- Wound Assessment Details Patient Name: Date of Service: [name], RO [name] 05/06/2023 7:30 A M Medical Record Number: [id number] Patient Account Number: [id number] Date of Birth/Sex: Treating RN: 09-Jul-1980 (42 y.o. [name] Primary Care Desmin Daleo: [name] Other Clinician: Referring Jolita Haefner: Treating Arlie Riker/Extender: Robbie Louis in Treatment: 6 Wound Status Wound Number: 4 Primary Etiology: Pyoderma Wound Location: Left, Lateral Lower Leg Wound Status: Open Wounding Event: Gradually Appeared Comorbid History: Sleep Apnea, Hypertension, Colitis, Type II Diabetes Date Acquired: 03/13/2023 Weeks Of Treatment: 6 Clustered Wound: Yes Photos [place], [name] ([id number]) [id number] Page 8 of 9 Wound Measurements Length: (cm) Width: (cm) Depth: (cm) Clustered Quantity: Area: (cm) Volume: (cm) 0.1 % Reduction in Area: 99.5% 0.1 % Reduction in Volume: 99.7% 0.1  Epithelialization: Large (67-100%) 2 Tunneling: No 0.008 Undermining: No  0.001 Wound Description Classification: Full Thickness Without Exposed Supp Exudate Amount: Medium Exudate Type: Serosanguineous Exudate Color: red, brown ort Structures Foul Odor After Cleansing: No Slough/Fibrino Yes Wound Bed Granulation Amount: None Present (0%) Exposed Structure Necrotic Amount: None Present (0%) Fat Layer (Subcutaneous Tissue) Exposed: No Treatment Notes Wound #4 (Lower Leg) Wound Laterality: Left, Lateral Cleanser Soap and Water Discharge Instruction: Gently cleanse wound with antibacterial soap, rinse and pat dry prior to dressing wounds Wound Cleanser Discharge Instruction: Wash your hands with soap and water. Remove old dressing, discard into plastic bag and place into trash. Cleanse the wound with Wound Cleanser prior to applying a clean dressing using gauze sponges, not tissues or cotton balls. Do not scrub or use excessive force. Pat dry using gauze sponges, not tissue or cotton balls. Peri-Wound Care Triamcinolone Acetonide Cream, 0.1%, 15 (g) tube Discharge Instruction: to leg for itching Apply as directed. Topical Clobetasol Propionate ointment 0.05%, 60 (g) tube Discharge Instruction: apply to periwound Primary Dressing Curad Oil Emulsion Dressing 3x3 (in/in) Secondary Dressing ABD Pad 5x9 (in/in) Discharge Instruction: Cover with ABD pad Secured With Compression Wrap Urgo K2, two layer compression system, large Compression Stockings Add-Ons Electronic Signature(s) Signed: 05/06/2023 3:54:48 PM By: Angelina Pih Entered By: Angelina Pih on 05/06/2023 04:52:02 Beza, [name] ([id number]) [id number] Page 9 of 9 -------------------------------------------------------------------------------- Vitals Details Patient Name: Date of Service: [name], [name] [name] 05/06/2023 7:30 A M Medical Record Number: [id number] Patient Account Number:  [id number] Date of Birth/Sex: Treating RN: 03/27/1981 (42 y.o. [name] Primary Care Belinda Schlichting: [name] Other Clinician: Referring Forney Kleinpeter: Treating Julitza Rickles/Extender: Robbie Louis in Treatment: 6 Vital Signs Time Taken: 07:40 Temperature (F): 97.7 Height (in): 70 Pulse (bpm): 80 Weight (lbs): 329 Respiratory Rate (breaths/min): 18 Body Mass Index (BMI): 47.2 Blood Pressure (mmHg): 126/85 Reference Range: 80 - 120 mg / dl Electronic Signature(s) Signed: 05/06/2023 7:56:33 AM By: Angelina Pih Entered By: Angelina Pih on 05/06/2023 04:56:33

## 2023-05-10 NOTE — Progress Notes (Signed)
[name], [name] ([id number]) [id number] Page 1 of 8 Visit Report for 05/06/2023 Chief Complaint Document Details Patient Name: Date of Service: [place], [name] 05/06/2023 7:30 A M Medical Record Number: [id number] Patient Account Number: [id number] Date of Birth/Sex: Treating RN: [birth year] (42 y.o. [name] Primary Care Provider: [name] Other Clinician: Referring Provider: Treating Provider/Extender: Robbie Louis in Treatment: 6 Information Obtained from: Patient Chief Complaint LLE pyoderma ulcer Electronic Signature(s) Signed: 05/09/2023 12:16:54 PM By: Allen Derry PA-C Entered By: Allen Derry on 05/06/2023 04:49:34 -------------------------------------------------------------------------------- Debridement Details Patient Name: Date of Service: [name], [name] 05/06/2023 7:30 A M Medical Record Number: [id number] Patient Account Number: [id number] Date of Birth/Sex: Treating RN: July 11, 1980 (42 y.o. [name] Primary Care Provider: [name] Other Clinician: Referring Provider: Treating Provider/Extender: Robbie Louis in Treatment: 6 Debridement Performed for Assessment: Wound #4 Left,Lateral Lower Leg Performed By: Physician Allen Derry, PA-C The following information was scribed by: Angelina Pih The information was scribed for: Allen Derry Debridement Type: Debridement Level of Consciousness (Pre-procedure): Awake and Alert Pre-procedure Verification/Time Out Yes - 08:02 Taken: Pain Control: Lidocaine 4% T opical Solution Percent of Wound Bed Debrided: 100% T Area Debrided (cm): otal 0.01 Tissue and other material debrided: Viable, Non-Viable, Skin: Dermis , Skin: Epidermis Level: Skin/Epidermis Debridement Description: Selective/Open Wound Instrument: Curette Bleeding: Minimum Hemostasis Achieved: Pressure Response to Treatment: Procedure was tolerated  well Level of Consciousness [place], Suhan ([id number]) [id number] Page 2 of 8 Level of Consciousness (Post- Awake and Alert procedure): Post Debridement Measurements of Total Wound Length: (cm) 0.1 Width: (cm) 0.1 Depth: (cm) 0.1 Volume: (cm) 0.001 Character of Wound/Ulcer Post Debridement: Stable Post Procedure Diagnosis Same as Pre-procedure Electronic Signature(s) Signed: 05/06/2023 3:54:48 PM By: Angelina Pih Signed: 05/09/2023 12:16:54 PM By: Allen Derry PA-C Entered By: Angelina Pih on 05/06/2023 05:04:07 -------------------------------------------------------------------------------- HPI Details Patient Name: Date of Service: [name], [name] 05/06/2023 7:30 A M Medical Record Number: [id number] Patient Account Number: [id number] Date of Birth/Sex: Treating RN: 17-Jan-1981 (42 y.o. [name] Primary Care Provider: [name] Other Clinician: Referring Provider: Treating Provider/Extender: Robbie Louis in Treatment: 6 History of Present Illness HPI Description: 12/04/16; 42 year old man who comes into the clinic today for review of a wound on the posterior left calf. He tells me that is been there for about a year. He is not a diabetic he does smoke half a pack per day. He was seen in the ER on 11/20/16 felt to have cellulitis around the wound and was given clindamycin. An x-ray did not show osteomyelitis. The patient initially tells me that he has a milk allergy that sets off a pruritic itching rash on his lower legs which she scratches incessantly and he thinks that's what may have set up the wound. He has been using various topical antibiotics and ointments without any effect. He works in a [employer]ing [employer] and is on his feet all day. He does not have a prior history of wounds however he does have the rash on both lower legs the right arm and the ventral aspect of his left arm. These are excoriations and  clearly have had scratching however there are of macular looking areas on both legs including a substantial larger area on the right leg. This does not have an underlying open area. There is no blistering. The patient tells me that 2 years ago in [place] in response to the rash on his legs he saw  a dermatologist who told him he had a condition which may be pyoderma gangrenosum although I may be putting words into his mouth. He seemed to recognize this. On further questioning he admits to a 5 year history of quiesced. ulcerative colitis. He is not in any treatment for this. He's had no recent travel 04/02/17; the patient was seen last week by Dr. Carles Collet at [facility] dermatology locally who kindly saw him at my request. 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 My concern was the degree of erythema medially extending from his left leg wound which was either inflammation from pyoderma or cellulitis. I put him on Augmentin however culture of the wound showed Pseudomonas which is quinolone sensitive. I really don't believe he has cellulitis however in view of everything I will continue and give him a course of Cipro. He is also on doxycycline as an immune modulator for the pyoderma. In addition to his original wound on the left lateral leg with surrounding erythema he has a wound on the right posterior calf which was an original biopsy site done by dermatology. This was felt to represent pathergy from pyoderma gangrenosum Readmission: 03-25-2023 patient presents today for readmission here in the clinic concerning issues he is having with a wound in the same area that we previously took care of for him. He tells me that around 24 October he had an area that opened and he just has not been able to get the close he thought it might be best to come back here as opposed to try to do it himself and letting it get worse. He continues with the Remicade  infusions every 6 weeks that really has not changed. His past medical history also has not changed significantly from what I saw him last. He was discharged October 2023. 04-01-2023 upon evaluation today patient appears to be doing well currently in regard to his wound there is no signs of infection I think that the film and biofilm on the surface of the wound went right off there was no need for sharp debridement with this being pyoderma he does not allow for debridement anyways. Fortunately I do think that he is making really good progress here towards closure however and I feel like that the patient is doing quite well already and its only been 1 week. The wrap did start to slide on Sunday he took it off and use his own compression he did a very good job keeping this under control his leg is definitely smaller this week compared to last week. 04-08-2023 upon evaluation today patient appears to be doing excellent in regard to his wound he is already showing signs of new granulation growth which is great news and I am very pleased in that regard. Fortunately I do not see any signs of active infection at this time. No fevers, chills, nausea, vomiting, or diarrhea. [name], [name] ([id number]) [id number] Page 3 of 8 04-15-2023 upon evaluation patient appears to be doing well currently in regard to his wound. He is actually showing signs of improvement and I feel like that this is making headway towards closure fairly rapidly this is great news. 04-29-2023 upon evaluation today patient appears to be doing well currently in regard to his wound which is actually showing signs of being very close to complete resolution. Fortunately I do not see any signs of infection and I think he is doing quite well. 05-06-2023 upon evaluation today patient's wound actually  showing signs of having some dry skin around the edges of the wound. Fortunately there does not appear to be any signs of  active infection which is good news and in general I do believe that making good headway here towards closure which is excellent as well. I do not see any signs of infection Electronic Signature(s) Signed: 05/06/2023 8:27:37 AM By: Allen Derry PA-C Entered By: Allen Derry on 05/06/2023 05:27:36 -------------------------------------------------------------------------------- Physical Exam Details Patient Name: Date of Service: [name], [name] 05/06/2023 7:30 A M Medical Record Number: [id number] Patient Account Number: [id number] Date of Birth/Sex: Treating RN: 1980-12-08 (42 y.o. [name] Primary Care Provider: [name] Other Clinician: Referring Provider: Treating Provider/Extender: Robbie Louis in Treatment: 6 Constitutional Well-nourished and well-hydrated in no acute distress. Respiratory normal breathing without difficulty. Psychiatric this patient is able to make decisions and demonstrates good insight into disease process. Alert and Oriented x 3. pleasant and cooperative. Notes Upon inspection patient's wound bed actually showed signs of need for some sharp debridement clearway necrotic debris this was mainly skin and leftover dressing material. Postdebridement this looks to be pretty much healed there is just a very tiny opening remaining at the moment. Electronic Signature(s) Signed: 05/06/2023 8:29:43 AM By: Allen Derry PA-C Entered By: Allen Derry on 05/06/2023 05:29:43 -------------------------------------------------------------------------------- Physician Orders Details Patient Name: Date of Service: [name], [name] 05/06/2023 7:30 A M Medical Record Number: [id number] Patient Account Number: [id number] Date of Birth/Sex: Treating RN: [birth year] (42 y.o. [name] Primary Care Provider: [name] Other Clinician: Referring Provider: Treating Provider/Extender: Skip Mayer [place], [name]  ([id number]) [id number] Page 4 of 8 Weeks in Treatment: 6 The following information was scribed by: Angelina Pih The information was scribed for: Allen Derry Verbal / Phone Orders: No Diagnosis Coding ICD-10 Coding Code Description I87.332 Chronic venous hypertension (idiopathic) with ulcer and inflammation of left lower extremity L97.222 Non-pressure chronic ulcer of left calf with fat layer exposed E11.622 Type 2 diabetes mellitus with other skin ulcer L88 Pyoderma gangrenosum F17.208 Nicotine dependence, unspecified, with other nicotine-induced disorders Follow-up Appointments Return Appointment in 1 week. Nurse Visit as needed - PRN Bathing/ Shower/ Hygiene May shower with wound dressing protected with water repellent cover or cast protector. No tub bath. Anesthetic (Use 'Patient Medications' Section for Anesthetic Order Entry) Lidocaine applied to wound bed Edema Control - Orders / Instructions Elevate, Exercise Daily and A void Standing for Long Periods of Time. Elevate legs to the level of the heart and pump ankles as often as possible Elevate leg(s) parallel to the floor when sitting. DO YOUR BEST to sleep in the bed at night. DO NOT sleep in your recliner. Long hours of sitting in a recliner leads to swelling of the legs and/or potential wounds on your backside. Non-Wound Condition dditional non-wound orders/instructions: - nystatin cream to left foot/toes for irritation A Wound Treatment Wound #4 - Lower Leg Wound Laterality: Left, Lateral Cleanser: Soap and Water 1 x Per Week/15 Days Discharge Instructions: Gently cleanse wound with antibacterial soap, rinse and pat dry prior to dressing wounds Cleanser: Wound Cleanser 1 x Per Week/15 Days Discharge Instructions: Wash your hands with soap and water. Remove old dressing, discard into plastic bag and place into trash. Cleanse the wound with Wound Cleanser prior to applying a clean dressing  using gauze sponges, not tissues or cotton balls. Do not scrub or use excessive force. Pat dry using gauze sponges, not tissue or cotton balls. Peri-Wound Care:  Triamcinolone Acetonide Cream, 0.1%, 15 (g) tube 1 x Per Week/15 Days Discharge Instructions: to leg for itching Apply as directed. Topical: Clobetasol Propionate ointment 0.05%, 60 (g) tube 1 x Per Week/15 Days Discharge Instructions: apply to periwound Prim Dressing: Curad Oil Emulsion Dressing 3x3 (in/in) ary 1 x Per Week/15 Days Secondary Dressing: ABD Pad 5x9 (in/in) 1 x Per Week/15 Days Discharge Instructions: Cover with ABD pad Compression Wrap: Urgo K2, two layer compression system, large 1 x Per Week/15 Days Electronic Signature(s) Signed: 05/06/2023 3:54:48 PM By: Angelina Pih Signed: 05/09/2023 12:16:54 PM By: Allen Derry PA-C Entered By: Angelina Pih on 05/06/2023 05:17:41 [place], [name] ([id number]) [id number] Page 5 of 8 -------------------------------------------------------------------------------- Problem List Details Patient Name: Date of Service: [place], [name] 05/06/2023 7:30 A M Medical Record Number: [id number] Patient Account Number: [id number] Date of Birth/Sex: Treating RN: 1980/11/02 (42 y.o. [name] Primary Care Provider: [name] Other Clinician: Referring Provider: Treating Provider/Extender: Robbie Louis in Treatment: 6 Active Problems ICD-10 Encounter Code Description Active Date MDM Diagnosis I87.332 Chronic venous hypertension (idiopathic) with ulcer and inflammation of left 03/25/2023 No Yes lower extremity L97.222 Non-pressure chronic ulcer of left calf with fat layer exposed 03/25/2023 No Yes E11.622 Type 2 diabetes mellitus with other skin ulcer 03/25/2023 No Yes L88 Pyoderma gangrenosum 03/25/2023 No Yes F17.208 Nicotine dependence, unspecified, with other nicotine-induced disorders 03/25/2023 No Yes Inactive  Problems Resolved Problems Electronic Signature(s) Signed: 05/09/2023 12:16:54 PM By: Allen Derry PA-C Entered By: Allen Derry on 05/06/2023 04:49:28 -------------------------------------------------------------------------------- Progress Note Details Patient Name: Date of Service: [name], [name] 05/06/2023 7:30 A M Medical Record Number: [id number] Patient Account Number: [id number] Date of Birth/Sex: Treating RN: 07-16-1980 (43 y.o. [name] [place], [place] ([id number]) [id number] Page 6 of 8 Primary Care Provider: [name] Other Clinician: Referring Provider: Treating Provider/Extender: Robbie Louis in Treatment: 6 Subjective Chief Complaint Information obtained from Patient LLE pyoderma ulcer History of Present Illness (HPI) 12/04/16; 42 year old man who comes into the clinic today for review of a wound on the posterior left calf. He tells me that is been there for about a year. He is not a diabetic he does smoke half a pack per day. He was seen in the ER on 11/20/16 felt to have cellulitis around the wound and was given clindamycin. An x- ray did not show osteomyelitis. The patient initially tells me that he has a milk allergy that sets off a pruritic itching rash on his lower legs which she scratches incessantly and he thinks that's what may have set up the wound. He has been using various topical antibiotics and ointments without any effect. He works in a [employer]ing [employer] and is on his feet all day. He does not have a prior history of wounds however he does have the rash on both lower legs the right arm and the ventral aspect of his left arm. These are excoriations and clearly have had scratching however there are of macular looking areas on both legs including a substantial larger area on the right leg. This does not have an underlying open area. There is no blistering. The patient tells me that 2 years ago in [place] in  response to the rash on his legs he saw a dermatologist who told him he had a condition which may be pyoderma gangrenosum although I may be putting words into his mouth. He seemed to recognize this. On further questioning he admits to a 5 year history of  quiesced. ulcerative colitis. He is not in any treatment for this. He's had no recent travel 04/02/17; the patient was seen last week by Dr. Carles Collet at [facility] dermatology locally who kindly saw him at my request. 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 My concern was the degree of erythema medially extending from his left leg wound which was either inflammation from pyoderma or cellulitis. I put him on Augmentin however culture of the wound showed Pseudomonas which is quinolone sensitive. I really don't believe he has cellulitis however in view of everything I will continue and give him a course of Cipro. He is also on doxycycline as an immune modulator for the pyoderma. In addition to his original wound on the left lateral leg with surrounding erythema he has a wound on the right posterior calf which was an original biopsy site done by dermatology. This was felt to represent pathergy from pyoderma gangrenosum Readmission: 03-25-2023 patient presents today for readmission here in the clinic concerning issues he is having with a wound in the same area that we previously took care of for him. He tells me that around 24 October he had an area that opened and he just has not been able to get the close he thought it might be best to come back here as opposed to try to do it himself and letting it get worse. He continues with the Remicade infusions every 6 weeks that really has not changed. His past medical history also has not changed significantly from what I saw him last. He was discharged October 2023. 04-01-2023 upon evaluation today patient appears to be doing well currently in regard  to his wound there is no signs of infection I think that the film and biofilm on the surface of the wound went right off there was no need for sharp debridement with this being pyoderma he does not allow for debridement anyways. Fortunately I do think that he is making really good progress here towards closure however and I feel like that the patient is doing quite well already and its only been 1 week. The wrap did start to slide on Sunday he took it off and use his own compression he did a very good job keeping this under control his leg is definitely smaller this week compared to last week. 04-08-2023 upon evaluation today patient appears to be doing excellent in regard to his wound he is already showing signs of new granulation growth which is great news and I am very pleased in that regard. Fortunately I do not see any signs of active infection at this time. No fevers, chills, nausea, vomiting, or diarrhea. 04-15-2023 upon evaluation patient appears to be doing well currently in regard to his wound. He is actually showing signs of improvement and I feel like that this is making headway towards closure fairly rapidly this is great news. 04-29-2023 upon evaluation today patient appears to be doing well currently in regard to his wound which is actually showing signs of being very close to complete resolution. Fortunately I do not see any signs of infection and I think he is doing quite well. 05-06-2023 upon evaluation today patient's wound actually showing signs of having some dry skin around the edges of the wound. Fortunately there does not appear to be any signs of active infection which is good news and in general I do believe that making good headway here towards closure which is excellent as well.  I do not see any signs of infection Objective Constitutional Well-nourished and well-hydrated in no acute distress. Vitals Time Taken: 7:40 AM, Height: 70 in, Weight: 329 lbs, BMI: 47.2,  Temperature: 97.7 F, Pulse: 80 bpm, Respiratory Rate: 18 breaths/min, Blood Pressure: 126/85 mmHg. Respiratory normal breathing without difficulty. Psychiatric this patient is able to make decisions and demonstrates good insight into disease process. Alert and Oriented x 3. pleasant and cooperative. General Notes: Upon inspection patient's wound bed actually showed signs of need for some sharp debridement clearway necrotic debris this was mainly skin and leftover dressing material. Postdebridement this looks to be pretty much healed there is just a very tiny opening remaining at the moment. Integumentary (Hair, Skin) Wound #4 status is Open. Original cause of wound was Gradually Appeared. The date acquired was: 03/13/2023. The wound has been in treatment 6 weeks. ANTAVIOUS, SKELLIE ([id number]) [id number] Page 7 of 8 The wound is located on the Left,Lateral Lower Leg. The wound measures 0.1cm length x 0.1cm width x 0.1cm depth; 0.008cm^2 area and 0.001cm^3 volume. There is no tunneling or undermining noted. There is a medium amount of serosanguineous drainage noted. There is no granulation within the wound bed. There is no necrotic tissue within the wound bed. 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 There was a Selective/Open Wound Skin/Epidermis Debridement with a total area of 0.01 sq cm performed by Allen Derry, PA-C. With the following instrument(s): Curette to remove Viable and Non-Viable tissue/material. Material removed includes Skin: Dermis and Skin: Epidermis and  after achieving pain control using Lidocaine 4% T opical Solution. No specimens were taken. A time out was conducted at 08:02, prior to the start of the procedure. A Minimum amount of bleeding was controlled with Pressure. The procedure was tolerated well. Post Debridement Measurements: 0.1cm length x 0.1cm width x 0.1cm depth; 0.001cm^3 volume. Character of Wound/Ulcer Post Debridement is stable. Post procedure Diagnosis Wound #4: Same as Pre-Procedure Pre-procedure diagnosis of Wound #4 is a Pyoderma located on the Left,Lateral Lower Leg . There was a Double Layer Compression Therapy Procedure by Angelina Pih, RN. Post procedure Diagnosis Wound #4: Same as Pre-Procedure Plan Follow-up Appointments: Return Appointment in 1 week. Nurse Visit as needed - PRN Bathing/ Shower/ Hygiene: May shower with wound dressing protected with water repellent cover or cast protector. No tub bath. Anesthetic (Use 'Patient Medications' Section for Anesthetic Order Entry): Lidocaine applied to wound bed Edema Control - Orders / Instructions: Elevate, Exercise Daily and Avoid Standing for Long Periods of Time. Elevate legs to the level of the heart and pump ankles as often as possible Elevate leg(s) parallel to the floor when sitting. DO YOUR BEST to sleep in the bed at night. DO NOT sleep in your recliner. Long hours of sitting in a recliner leads to swelling of the legs and/or potential wounds on your backside. Non-Wound Condition: Additional non-wound orders/instructions: - nystatin cream to left foot/toes for irritation WOUND #4: - Lower Leg Wound Laterality: Left, Lateral Cleanser: Soap and Water 1 x Per Week/15 Days Discharge Instructions: Gently cleanse wound with antibacterial soap, rinse and pat dry prior to dressing wounds Cleanser: Wound Cleanser 1 x Per Week/15 Days Discharge Instructions: [facility]  your hands with soap and water. Remove old dressing, discard into plastic bag and place into trash.  Cleanse the wound with Wound Cleanser prior to applying a clean dressing using gauze sponges, not tissues or cotton balls. Do not scrub or use excessive force. Pat dry using gauze sponges, not tissue or cotton balls. Peri-Wound Care: Triamcinolone Acetonide Cream, 0.1%, 15 (g) tube 1 x Per Week/15 Days Discharge Instructions: to leg for itching Apply as directed. Topical: Clobetasol Propionate ointment 0.05%, 60 (g) tube 1 x Per Week/15 Days Discharge Instructions: apply to periwound Prim Dressing: Curad Oil Emulsion Dressing 3x3 (in/in) 1 x Per Week/15 Days ary Secondary Dressing: ABD Pad 5x9 (in/in) 1 x Per Week/15 Days Discharge Instructions: Cover with ABD pad Com pression Wrap: Urgo K2, two layer compression system, large 1 x Per Week/15 Days 1. I would recommend based on what we are seeing that we have the patient going continue to monitor for any signs of infection or worsening. I feel like that the patient is really doing quite well here. 2. I am going to recommend as well that we use the clobetasol followed by the oil emulsion dressing just to prevent anything from sticking and allow this area to toughen up over the next week. 3. I am also going to recommend the patient should continue with the compression wrapping this is the Urgo K2 compression wrap. We will see patient back for reevaluation in 1 week here in the clinic. If anything worsens or changes patient will contact our office for additional recommendations. [name], [name] ([id number]) [id number] Page 8 of 8 Electronic Signature(s) Signed: 05/06/2023 8:30:17 AM By: Allen Derry PA-C Entered By: Allen Derry on 05/06/2023 05:30:17 -------------------------------------------------------------------------------- SuperBill Details Patient Name: Date of Service: [name], [name] 05/06/2023 Medical Record Number: [id number] Patient Account Number: [id number] Date of Birth/Sex: Treating RN: [birth year] (42  y.o. 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 Nicotine dependence, unspecified, with other nicotine-induced disorders Facility Procedures : CPT4 Code: 46962952 Description: 97597 - DEBRIDE WOUND 1ST 20 SQ CM OR < ICD-10 Diagnosis Description L97.222 Non-pressure chronic ulcer of left calf with fat layer exposed Modifier: Quantity: 1 Physician Procedures : CPT4 Code Description Modifier [id number] - WC PHYS DEBR WO ANESTH 20 SQ CM ICD-10 Diagnosis Description L97.222 Non-pressure chronic ulcer of left calf with fat layer exposed Quantity: 1 Electronic Signature(s) Signed: 05/06/2023 8:31:15 AM By: Allen Derry PA-C Entered By: Allen Derry on 05/06/2023 05:31:15

## 2023-05-13 ENCOUNTER — Encounter: Payer: 59 | Admitting: Physician Assistant

## 2023-05-13 DIAGNOSIS — I87332 Chronic venous hypertension (idiopathic) with ulcer and inflammation of left lower extremity: Secondary | ICD-10-CM | POA: Diagnosis not present

## 2023-05-13 NOTE — Progress Notes (Signed)
[name], [name] ([id number]) [id number] Page 1 of 7 Visit Report for 05/13/2023 Chief Complaint Document Details Patient Name: Date of Service: [name], [name] [name] 05/13/2023 7:30 A M Medical Record Number: [id number] Patient Account Number: [id number] Date of Birth/Sex: Treating RN: 02-Dec-1980 (42 y.o. [name] Primary Care Provider: [name] Other Clinician: Referring Provider: Treating Provider/Extender: Robbie Louis in Treatment: 7 Information Obtained from: Patient Chief Complaint LLE pyoderma ulcer Electronic Signature(s) Signed: 05/13/2023 7:49:35 AM By: Allen Derry PA-C Entered By: Allen Derry on 05/13/2023 07:49:35 -------------------------------------------------------------------------------- HPI Details Patient Name: Date of Service: [name], [name] 05/13/2023 7:30 A M Medical Record Number: [id number] Patient Account Number: [id number] Date of Birth/Sex: Treating RN: 1981/04/18 (42 y.o. [name] Primary Care Provider: [name] Other Clinician: Referring Provider: Treating Provider/Extender: Robbie Louis in Treatment: 7 History of Present Illness HPI Description: 12/04/16; 42 year old man who comes into the clinic today for review of a wound on the posterior left calf. He tells me that is been there for about a year. He is not a diabetic he does smoke half a pack per day. He was seen in the ER on 11/20/16 felt to have cellulitis around the wound and was given clindamycin. An x-ray did not show osteomyelitis. The patient initially tells me that he has a milk allergy that sets off a pruritic itching rash on his lower legs which she scratches incessantly and he thinks that's what may have set up the wound. He has been using various topical antibiotics and ointments without any effect. He works in a [employer]ing [employer] and is on his feet all day. He does not have a prior history of  wounds however he does have the rash on both lower legs the right arm and the ventral aspect of his left arm. These are excoriations and clearly have had scratching however there are of macular looking areas on both legs including a substantial larger area on the right leg. This does not have an underlying open area. There is no blistering. 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 He seemed to recognize this. On further questioning he admits to a 5 year history of quiesced. ulcerative colitis. He is not in any treatment for this. He's had no recent travel 04/02/17; the patient was seen last week by Dr. Carles Collet at [facility] dermatology locally who kindly saw him at my request. 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 My concern was the degree of erythema medially extending from his left leg wound which was either inflammation from pyoderma or cellulitis. I put him on Augmentin however culture of the wound showed Pseudomonas which is quinolone sensitive. I really don't believe he has cellulitis however in view of everything I will continue and give him a course of Cipro. He is also on doxycycline as an immune modulator for the pyoderma. [name], [name] ([id number]) [id number] Page 2 of 7 In addition to his original wound on the left lateral leg with surrounding erythema he has a wound on the right posterior calf which was an original biopsy site done by dermatology. This was felt to represent pathergy from pyoderma gangrenosum Readmission: 03-25-2023 patient presents today for readmission here in the clinic concerning issues he  is having with a wound in the same area that we previously took care of for him. He tells me that around 24  October he had an area that opened and he just has not been able to get the close he thought it might be best to come back here as opposed to try to do it himself and letting it get worse. He continues with the Remicade infusions every 6 weeks that really has not changed. His past medical history also has not changed significantly from what I saw him last. He was discharged October 2023. 04-01-2023 upon evaluation today patient appears to be doing well currently in regard to his wound there is no signs of infection I think that the film and biofilm on the surface of the wound went right off there was no need for sharp debridement with this being pyoderma he does not allow for debridement anyways. Fortunately I do think that he is making really good progress here towards closure however and I feel like that the patient is doing quite well already and its only been 1 week. The wrap did start to slide on Sunday he took it off and use his own compression he did a very good job keeping this under control his leg is definitely smaller this week compared to last week. 04-08-2023 upon evaluation today patient appears to be doing excellent in regard to his wound he is already showing signs of new granulation growth which is great news and I am very pleased in that regard. Fortunately I do not see any signs of active infection at this time. No fevers, chills, nausea, vomiting, or diarrhea. 04-15-2023 upon evaluation patient appears to be doing well currently in regard to his wound. He is actually showing signs of improvement and I feel like that this is making headway towards closure fairly rapidly this is great news. 04-29-2023 upon evaluation today patient appears to be doing well currently in regard to his wound which is actually showing signs of being very close to complete resolution. Fortunately I do not see any signs of infection and I think he is doing quite well. 05-06-2023 upon evaluation today  patient's wound actually showing signs of having some dry skin around the edges of the wound. Fortunately there does not appear to be any signs of active infection which is good news and in general I do believe that making good headway here towards closure which is excellent as well. I do not see any signs of infection 05-13-2023 upon evaluation today patient appears to be doing excellent in regard to his wound in fact he appears to be completely healed which is awesome news. Fortunately I do not see any signs of active infection at this time. No fevers, chills, nausea, vomiting, or diarrhea. Electronic Signature(s) Signed: 05/13/2023 8:00:03 AM By: Allen Derry PA-C Entered By: Allen Derry on 05/13/2023 08:00:03 -------------------------------------------------------------------------------- Physical Exam Details Patient Name: Date of Service: [name], [name] 05/13/2023 7:30 A M Medical Record Number: [id number] Patient Account Number: [id number] Date of Birth/Sex: Treating RN: [birth year] (42 y.o. [name] Primary Care Provider: [name] Other Clinician: Referring Provider: Treating Provider/Extender: Robbie Louis in Treatment: 7 Constitutional Obese and well-hydrated in no acute distress. Respiratory normal breathing without difficulty. Psychiatric this patient is able to make decisions and demonstrates good insight into disease process. Alert and Oriented x 3. pleasant and cooperative. Notes Upon inspection patient's wound bed actually showed signs of good granulation epithelization at this point.  Fortunately I do not see any signs of active infection at this time in fact I do not see anything that appears to be open. Everything I think he is in good shape here for discharge. Electronic Signature(s) Signed: 05/13/2023 8:01:03 AM By: Allen Derry PA-C Entered By: Allen Derry on 05/13/2023 08:01:03 Trompeter, [name] ([id number])  [id number] Page 3 of 7 -------------------------------------------------------------------------------- Physician Orders Details Patient Name: Date of Service: [name], [name] 05/13/2023 7:30 A M Medical Record Number: [id number] Patient Account Number: [id number] Date of Birth/Sex: Treating RN: [birth year] (42 y.o. 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 Wear compression garments daily. Put garments on first thing when you wake up and remove them before bed. Moisturize legs daily after removing compression garments. - 2 weeks with the Clobetasol once daily in the evening. AandD ointment in the morning. Elevate, Exercise Daily and Avoid Standing for Long Periods of Time. DO YOUR BEST to sleep in the bed at night. DO NOT sleep in your recliner. Long hours of sitting in a recliner leads to swelling of the legs and/or potential wounds on your backside. Wound Treatment Patient Medications llergies: milk, Biaxin, silver A Notifications Medication Indication Start End 05/13/2023 clobetasol DOSE topical 0.05 % ointment - ointment topical once daily applied in a thin film to the healed location x 2 weeks  then as needed if the region become irritated Electronic Signature(s) Signed: 05/13/2023 8:03:51 AM By: Allen Derry PA-C Entered By: Allen Derry on 05/13/2023 08:03:51 Problem List Details -------------------------------------------------------------------------------- [name] ([id number]) [id number] Page 4 of 7 Patient Name: Date of Service: Holiday, [name] [name] 05/13/2023 7:30 A M Medical Record Number: [id number] Patient Account Number: [id number] Date of Birth/Sex: Treating RN: March 13, 1981 (42 y.o. [name] Primary Care Provider: [name] Other Clinician: Referring Provider: Treating Provider/Extender: Robbie Louis in Treatment: 7 Active Problems ICD-10 Encounter Code Description Active Date MDM Diagnosis I87.332 Chronic venous hypertension (idiopathic) with ulcer and inflammation of left 03/25/2023 No Yes lower extremity L97.222 Non-pressure chronic ulcer of left calf with fat layer exposed 03/25/2023 No Yes E11.622 Type 2 diabetes mellitus with other skin ulcer 03/25/2023 No Yes L88 Pyoderma gangrenosum 03/25/2023 No Yes F17.208 Nicotine dependence, unspecified, with other nicotine-induced disorders 03/25/2023 No Yes Inactive Problems Resolved Problems Electronic Signature(s) Signed: 05/13/2023 7:49:31 AM By: Allen Derry PA-C Entered By: Allen Derry on 05/13/2023 07:49:31 -------------------------------------------------------------------------------- Progress Note Details Patient Name: Date of Service: [name], [name] 05/13/2023 7:30 A M Medical Record Number: [id number] Patient Account Number: [id number] Date of Birth/Sex: Treating RN: 12-19-1980 (42 y.o. [name] Primary Care Provider: [name] Other Clinician: Referring Provider: Treating Provider/Extender: Robbie Louis in Treatment: 7 Subjective Chief Complaint Information obtained from Patient LLE pyoderma  ulcer History of Present Illness (HPI) 12/04/16; 42 year old man who comes into the clinic today for review of a wound on the posterior left  calf. He tells me that is been there for about a year. He is not a diabetic he does smoke half a pack per day. He was seen in the ER on 11/20/16 felt to have cellulitis around the wound and was given clindamycin. An xDeiondre Chorney, Thierry ([id number]) [id number] Page 5 of 7 ray did not show osteomyelitis. The patient initially tells me that he has a milk allergy that sets off a pruritic itching rash on his lower legs which she scratches incessantly and he thinks that's what may have set up the wound. He has been using various topical antibiotics and ointments without any effect. He works in a [employer]ing [employer] and is on his feet all day. He does not have a prior history of wounds however he does have the rash on both lower legs the right arm and the ventral aspect of his left arm. These are excoriations and clearly have had scratching however there are of macular looking areas on both legs including a substantial larger area on the right leg. This does not have an underlying open area. There is no blistering. 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 He seemed to recognize this. On further questioning he admits to a 5 year history of quiesced. ulcerative colitis. He is not in any treatment for this. He's had no recent travel 04/02/17; the patient was seen last week by Dr. Carles Collet at [facility] dermatology locally who kindly saw him at my request. 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 My concern was the degree of erythema medially extending from his left leg wound which was either inflammation from pyoderma or  cellulitis. I put him on Augmentin however culture of the wound showed Pseudomonas which is quinolone sensitive. I really don't believe he has cellulitis however in view of everything I will continue and give him a course of Cipro. He is also on doxycycline as an immune modulator for the pyoderma. In addition to his original wound on the left lateral leg with surrounding erythema he has a wound on the right posterior calf which was an original biopsy site done by dermatology. This was felt to represent pathergy from pyoderma gangrenosum Readmission: 03-25-2023 patient presents today for readmission here in the clinic concerning issues he is having with a wound in the same area that we previously took care of for him. He tells me that around 24 October he had an area that opened and he just has not been able to get the close he thought it might be best to come back here as opposed to try to do it himself and letting it get worse. He continues with the Remicade infusions every 6 weeks that really has not changed. His past medical history also has not changed significantly from what I saw him last. He was discharged October 2023. 04-01-2023 upon evaluation today patient appears to be doing well currently in regard to his wound there is no signs of infection I think that the film and biofilm on the surface of the wound went right off there was no need for sharp debridement with this being pyoderma he does not allow for debridement anyways. Fortunately I do think that he is making really good progress here towards closure however and  I feel like that the patient is doing quite well already and its only been 1 week. The wrap did start to slide on Sunday he took it off and use his own compression he did a very good job keeping this under control his leg is definitely smaller this week compared to last week. 04-08-2023 upon evaluation today patient appears to be doing excellent in regard to his wound he is  already showing signs of new granulation growth which is great news and I am very pleased in that regard. Fortunately I do not see any signs of active infection at this time. No fevers, chills, nausea, vomiting, or diarrhea. 04-15-2023 upon evaluation patient appears to be doing well currently in regard to his wound. He is actually showing signs of improvement and I feel like that this is making headway towards closure fairly rapidly this is great news. 04-29-2023 upon evaluation today patient appears to be doing well currently in regard to his wound which is actually showing signs of being very close to complete resolution. Fortunately I do not see any signs of infection and I think he is doing quite well. 05-06-2023 upon evaluation today patient's wound actually showing signs of having some dry skin around the edges of the wound. Fortunately there does not appear to be any signs of active infection which is good news and in general I do believe that making good headway here towards closure which is excellent as well. I do not see any signs of infection 05-13-2023 upon evaluation today patient appears to be doing excellent in regard to his wound in fact he appears to be completely healed which is awesome news. Fortunately I do not see any signs of active infection at this time. No fevers, chills, nausea, vomiting, or diarrhea. Objective Constitutional Obese and well-hydrated in no acute distress. Vitals Time Taken: 7:42 AM, Height: 70 in, Weight: 329 lbs, BMI: 47.2, Temperature: 97.8 F, Pulse: 88 bpm, Respiratory Rate: 18 breaths/min, Blood Pressure: 128/85 mmHg. Respiratory normal breathing without difficulty. Psychiatric this patient is able to make decisions and demonstrates good insight into disease process. Alert and Oriented x 3. pleasant and cooperative. General Notes: Upon inspection patient's wound bed actually showed signs of good granulation epithelization at this point. Fortunately  I do not see any signs of active infection at this time in fact I do not see anything that appears to be open. Everything I think he is in good shape here for discharge. Integumentary (Hair, Skin) Wound #4 status is Open. Original cause of wound was Gradually Appeared. The date acquired was: 03/13/2023. The wound has been in treatment 7 weeks. The wound is located on the Left,Lateral Lower Leg. The wound measures 0.1cm length x 0.1cm width x 0.1cm depth; 0.008cm^2 area and 0.001cm^3 volume. There is no tunneling or undermining noted. There is a none present amount of drainage noted. There is no granulation within the wound bed. There is no necrotic tissue within the wound bed. 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 Wear compression garments daily. Put garments on first thing when you wake up and remove them before bed. Moisturize  legs daily after removing compression garments. - 2 weeks with the Clobetasol once daily in the evening. AandD ointment in the morning. Elevate, Exercise Daily and Avoid Standing for Long Periods of Time. DO YOUR BEST to sleep in the bed at night. DO NOT sleep in your recliner. Long hours of sitting in a recliner leads to swelling of the legs and/or potential wounds on your backside. The following medication(s) was prescribed: clobetasol topical 0.05 % ointment ointment topical once daily applied in a thin film to the healed location x 2 weeks then as needed if the region  become irritated starting 05/13/2023 1. I would recommend that we have the patient going and continue to monitor for any signs of infection or worsening. I do believe that he is doing quite well at this point. 2. Also can recommend that we have him go ahead and continue with the AandD ointment in the morning and then using a little bit of clobetasol at bedtime. I would send in a refill for the clobetasol just for him to have on hand. 3. I am going to recommend as well that he should continue to monitor for any signs of infection and if anything changes he should definitely let me know but right now anything looks to be doing excellent. We will see patient back for reevaluation in 1 week here in the clinic. If anything worsens or changes patient will contact our office for additional recommendations. Electronic Signature(s) Signed: 05/13/2023 8:04:04 AM By: Allen Derry PA-C Previous Signature: 05/13/2023 8:01:43 AM Version By: Allen Derry PA-C Entered By: Allen Derry on 05/13/2023 08:04:04 -------------------------------------------------------------------------------- SuperBill Details Patient Name: Date of Service: [name], [name] 05/13/2023 Medical Record Number: [id number] Patient Account Number: [id number] Date of Birth/Sex: Treating RN: 1981-01-30 (42 y.o. 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 Nicotine dependence, unspecified, with other nicotine-induced disorders [facility] Code: [id number] [facility]  ([id number]) Description: 99213 - WOUND CARE VISIT-LEV 3 EST PT [id number] Modifier: [id number] Pag Quantity: 1 e 7 of 7 Physician Procedures : CPT4 Code Description Modifier [id number] 99214 - WC PHYS LEVEL 4 - EST PT ICD-10 Diagnosis Description I87.332 Chronic venous hypertension (idiopathic) with ulcer and inflammation of left lower extremity L97.222 Non-pressure chronic ulcer of left calf  with fat layer exposed E11.622 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

## 2023-05-13 NOTE — Progress Notes (Signed)
[name], [name] ([id number]) [id number] Page 1 of 8 Visit Report for 05/13/2023 Arrival Information Details Patient Name: Date of Service: [name] 05/13/2023 7:30 A M Medical Record Number: [id number] Patient Account Number: [id number] Date of Birth/Sex: Treating RN: 15-Jul-1980 (42 y.o. [name] Primary Care [place] List: [name] Other Clinician: Referring Cina Klumpp: Treating Lyn Deemer/Extender: Robbie Louis in Treatment: 7 Visit Information History Since Last Visit Added or deleted any medications: No Patient Arrived: Ambulatory Any new allergies or adverse reactions: No Arrival Time: 07:40 Had a fall or experienced change in No Accompanied By: self activities of daily living that may affect Transfer Assistance: None risk of falls: Patient Identification Verified: Yes Hospitalized since last visit: No Secondary Verification Process Completed: Yes Has Dressing in Place as Prescribed: Yes Patient Requires Transmission-Based Precautions: No Has Compression in Place as Prescribed: Yes Patient Has Alerts: No Pain Present Now: No Electronic Signature(s) Signed: 05/13/2023 11:53:39 AM By: Angelina Pih Entered By: Angelina Pih on 05/13/2023 04:42:04 -------------------------------------------------------------------------------- Clinic Level of Care Assessment Details Patient Name: Date of Service: [name], RO [name] 05/13/2023 7:30 A M Medical Record Number: [id number] Patient Account Number: [id number] Date of Birth/Sex: Treating RN: 1980/12/31 (42 y.o. [name] Primary Care Forestine Macho: [name] Other Clinician: Referring Amelia Macken: Treating Martyn Timme/Extender: Robbie Louis in Treatment: 7 Clinic Level of Care Assessment Items TOOL 4 Quantity Score []  - 0 Use when only an EandM is performed on FOLLOW-UP visit ASSESSMENTS - Nursing Assessment / Reassessment X- 1  10 Reassessment of Co-morbidities (includes updates in patient status) X- 1 5 Reassessment of Adherence to Treatment Plan ASSESSMENTS - Wound and Skin A ssessment / Reassessment X - Simple Wound Assessment / Reassessment - one wound 1 5 Durr, Ac ([id number]) [id number] Page 2 of 8 []  - 0 Complex Wound Assessment / Reassessment - multiple wounds []  - 0 Dermatologic / Skin Assessment (not related to wound area) ASSESSMENTS - Focused Assessment []  - 0 Circumferential Edema Measurements - multi extremities []  - 0 Nutritional Assessment / Counseling / Intervention X- 1 5 Lower Extremity Assessment (monofilament, tuning fork, pulses) []  - 0 Peripheral Arterial Disease Assessment (using hand held doppler) ASSESSMENTS - Ostomy and/or Continence Assessment and Care []  - 0 Incontinence Assessment and Management []  - 0 Ostomy Care Assessment and Management (repouching, etc.) PROCESS - Coordination of Care X - Simple Patient / Family Education for ongoing care 1 15 []  - 0 Complex (extensive) Patient / Family Education for ongoing care X- 1 10 Staff obtains Chiropractor, Records, T Results / Process Orders est []  - 0 Staff telephones HHA, Nursing Homes / Clarify orders / etc []  - 0 Routine Transfer to another Facility (non-emergent condition) []  - 0 Routine Hospital Admission (non-emergent condition) []  - 0 New Admissions / Manufacturing engineer / Ordering NPWT Apligraf, etc. , []  - 0 Emergency Hospital Admission (emergent condition) X- 1 10 Simple Discharge Coordination []  - 0 Complex (extensive) Discharge Coordination PROCESS - Special Needs []  - 0 Pediatric / Minor Patient Management []  - 0 Isolation Patient Management []  - 0 Hearing / Language / Visual special needs []  - 0 Assessment of Community assistance (transportation, D/C planning, etc.) []  - 0 Additional assistance / Altered mentation []  - 0 Support Surface(s) Assessment (bed,  cushion, seat, etc.) INTERVENTIONS - Wound Cleansing / Measurement X - Simple Wound Cleansing - one wound 1 5 []  - 0 Complex Wound Cleansing - multiple wounds X- 1 5 Wound Imaging (photographs - any number  of wounds) []  - 0 Wound Tracing (instead of photographs) []  - 0 Simple Wound Measurement - one wound []  - 0 Complex Wound Measurement - multiple wounds INTERVENTIONS - Wound Dressings []  - 0 Small Wound Dressing one or multiple wounds []  - 0 Medium Wound Dressing one or multiple wounds []  - 0 Large Wound Dressing one or multiple wounds X- 1 5 Application of Medications - topical []  - 0 Application of Medications - injection INTERVENTIONS - Miscellaneous []  - 0 External ear exam []  - 0 Specimen Collection (cultures, biopsies, blood, body fluids, etc.) []  - 0 Specimen(s) / Culture(s) sent or taken to Lab for analysis SAISH, WEATHERWAX ([id number]) [id number] Page 3 of 8 []  - 0 Patient Transfer (multiple staff / Nurse, adult / Similar devices) []  - 0 Simple Staple / Suture removal (25 or less) []  - 0 Complex Staple / Suture removal (26 or more) []  - 0 Hypo / Hyperglycemic Management (close monitor of Blood Glucose) []  - 0 Ankle / Brachial Index (ABI) - do not check if billed separately X- 1 5 Vital Signs Has the patient been seen at the hospital within the last three years: Yes Total Score: 80 Level Of Care: New/Established - Level 3 Electronic Signature(s) Signed: 05/13/2023 11:53:39 AM By: Angelina Pih Entered By: Angelina Pih on 05/13/2023 07:01:54 -------------------------------------------------------------------------------- Encounter Discharge Information Details Patient Name: Date of Service: [name], RO [name] 05/13/2023 7:30 A M Medical Record Number: [id number] Patient Account Number: [id number] Date of Birth/Sex: Treating RN: June 28, 1980 (42 y.o. [name] Primary Care Mckenzy Salazar: [name] Other  Clinician: Referring Zebulon Gantt: Treating Ferlin Fairhurst/Extender: Robbie Louis in Treatment: 7 Encounter Discharge Information Items Discharge Condition: Stable Ambulatory Status: Ambulatory Discharge Destination: Home Transportation: Private Auto Accompanied By: self Schedule Follow-up Appointment: No Clinical Summary of Care: Electronic Signature(s) Signed: 05/13/2023 10:03:14 AM By: Angelina Pih Entered By: Angelina Pih on 05/13/2023 07:03:14 -------------------------------------------------------------------------------- Lower Extremity Assessment Details Patient Name: Date of Service: [name], RO [name] 05/13/2023 7:30 A M Medical Record Number: [id number] Patient Account Number: [id number] Date of Birth/Sex: Treating RN: [birth year] (42 y.o. [name] Primary Care Tkeya Stencil: [name] Other Clinician: [name] ([id number]) [id number] Page 4 of 8 Referring Romanita Fager: Treating Richetta Cubillos/Extender: Robbie Louis in Treatment: 7 Edema Assessment Assessed: [Left: No] [Right: No] Edema: [Left: N] [Right: o] Vascular Assessment Pulses: Dorsalis Pedis Palpable: [Left:Yes] Extremity colors, hair growth, and conditions: Extremity Color: [Left:Hyperpigmented] Hair Growth on Extremity: [Left:Yes] Temperature of Extremity: [Left:Warm < 3 seconds] Toe Nail Assessment Left: Right: Thick: Yes Discolored: Yes Deformed: No Improper Length and Hygiene: No Electronic Signature(s) Signed: 05/13/2023 11:53:39 AM By: Angelina Pih Entered By: Angelina Pih on 05/13/2023 04:49:17 -------------------------------------------------------------------------------- Multi Wound Chart Details Patient Name: Date of Service: [name], RO [name] 05/13/2023 7:30 A M Medical Record Number: [id number] Patient Account Number: [id number] Date of Birth/Sex: Treating RN: 07/31/1980 (42 y.o. [name] Primary Care  Egon [name]: [name] Other Clinician: Referring Tyshana Nishida: Treating Kabrea Seeney/Extender: Robbie Louis in Treatment: 7 Vital Signs Height(in): 70 Pulse(bpm): 88 Weight(lbs): 329 Blood Pressure(mmHg): 128/85 Body Mass Index(BMI): 47.2 Temperature(F): 97.8 Respiratory Rate(breaths/min): 18 [4:Photos:] [N/A:N/A] Left, Lateral Lower Leg N/A N/A Wound Location: Gradually Appeared N/A N/A Wounding Event: JAHAIRE, HARWELL ([id number]) [phone] Page 5 of 8 Pyoderma N/A N/A Primary Etiology: Sleep Apnea, Hypertension, Colitis, N/A N/A Comorbid History: Type II Diabetes 03/13/2023 N/A N/A Date Acquired: 7 N/A N/A Weeks of Treatment: Open N/A N/A Wound Status: No N/A N/A Wound Recurrence:  Yes N/A N/A Clustered Wound: 2 N/A N/A Clustered Quantity: 0.1x0.1x0.1 N/A N/A Measurements L x W x D (cm) 0.008 N/A N/A A (cm) : rea 0.001 N/A N/A Volume (cm) : 99.50% N/A N/A % Reduction in Area: 99.70% N/A N/A % Reduction in Volume: Full Thickness Without Exposed N/A N/A Classification: Support Structures None Present N/A N/A Exudate A mount: None Present (0%) N/A N/A Granulation A mount: None Present (0%) N/A N/A Necrotic A mount: Fat Layer (Subcutaneous Tissue): No N/A N/A Exposed Structures: Large (67-100%) N/A N/A Epithelialization: Treatment Notes Electronic Signature(s) Signed: 05/13/2023 11:53:39 AM By: Angelina Pih Entered By: Angelina Pih on 05/13/2023 04:55:29 -------------------------------------------------------------------------------- Multi-Disciplinary Care Plan Details Patient Name: Date of Service: [name], RO [name] 05/13/2023 7:30 A M Medical Record Number: [id number] Patient Account Number: [id number] Date of Birth/Sex: Treating RN: 07/02/1980 (42 y.o. [name] Primary Care Tanita Palinkas: [name] Other Clinician: Referring Mohan Erven: Treating Orelia Brandstetter/Extender: Robbie Louis in Treatment: 7 Active Inactive Electronic Signature(s) Signed: 05/13/2023 10:02:13 AM By: Angelina Pih Entered By: Angelina Pih on 05/13/2023 07:02:13 -------------------------------------------------------------------------------- Pain Assessment Details Patient Name: Date of Service: [name], RO [name] 05/13/2023 7:30 A [name], [name] Maduro ([id number]) [phone].pdf Page 6 of 8 Medical Record Number: [id number] Patient Account Number: [id number] Date of Birth/Sex: Treating RN: [birth year] (42 y.o. [name] Primary Care Rue Tinnel: [name] Other Clinician: Referring Galvin Aversa: Treating Donnika Kucher/Extender: Robbie Louis in Treatment: 7 Active Problems Location of Pain Severity and Description of Pain Patient Has Paino No Site Locations Pain Management and Medication Current Pain Management: Electronic Signature(s) Signed: 05/13/2023 11:53:39 AM By: Angelina Pih Entered By: Angelina Pih on 05/13/2023 04:42:23 -------------------------------------------------------------------------------- Patient/Caregiver Education Details Patient Name: Date of Service: [name], RO [name] 12/24/2024andnbsp7:30 A M Medical Record Number: [id number] Patient Account Number: [id number] Date of Birth/Gender: Treating RN: 1980/12/02 (42 y.o. [name] Primary Care Physician: [name] Other Clinician: Referring Physician: Treating Physician/Extender: Robbie Louis in Treatment: 7 Education Assessment Education Provided To: Patient Education Topics Provided Wound/Skin Impairment: Handouts: Other: care of healed wound Methods: Explain/Verbal Responses: State content correctly [place], [name] Maduro ([id number]) [phone].pdf Page 7 of 8 Electronic Signature(s) Signed: 05/13/2023 11:53:39 AM By: Angelina Pih Entered By: Angelina Pih on 05/13/2023  07:02:28 -------------------------------------------------------------------------------- Wound Assessment Details Patient Name: Date of Service: [name], RO [name] 05/13/2023 7:30 A M Medical Record Number: [id number] Patient Account Number: [id number] Date of Birth/Sex: Treating RN: [birth year] (42 y.o. [name] Primary Care Niralya Ohanian: [name] Other Clinician: Referring Lourene Hoston: Treating Sharlette Jansma/Extender: Robbie Louis in Treatment: 7 Wound Status Wound Number: 4 Primary Etiology: Pyoderma Wound Location: Left, Lateral Lower Leg Wound Status: Healed - Epithelialized Wounding Event: Gradually Appeared Comorbid History: Sleep Apnea, Hypertension, Colitis, Type II Diabetes Date Acquired: 03/13/2023 Weeks Of Treatment: 7 Clustered Wound: Yes Photos Wound Measurements Length: (cm) Width: (cm) Depth: (cm) Clustered Quantity: Area: (cm) Volume: (cm) 0 % Reduction in Area: 100% 0 % Reduction in Volume: 100% 0 Epithelialization: Large (67-100%) 2 Tunneling: No 0 Undermining: No 0 Wound Description Classification: Full Thickness Without Exposed Supp Exudate Amount: None Present ort Structures Foul Odor After Cleansing: No Slough/Fibrino No Wound Bed Granulation Amount: None Present (0%) Exposed Structure Necrotic Amount: None Present (0%) Fat Layer (Subcutaneous Tissue) Exposed: No Treatment Notes Wound #4 (Lower Leg) Wound Laterality: Left, Lateral Cleanser Peri-Wound Care [place], [name] Maduro ([id number]) [id number] Page 8 of 8 Topical Primary Dressing Secondary Dressing Secured With Compression Wrap Compression Stockings Add-Ons Electronic  Signature(s) Signed: 05/13/2023 11:53:39 AM By: Angelina Pih Entered By: Angelina Pih on 05/13/2023 07:01:14 -------------------------------------------------------------------------------- Vitals Details Patient Name: Date of Service: [name], RO [name] 05/13/2023  7:30 A M Medical Record Number: [id number] Patient Account Number: [id number] Date of Birth/Sex: Treating RN: September 05, 1980 (42 y.o. [name] Primary Care Breeley Bischof: [name] Other Clinician: Referring Verdia Bolt: Treating Denika Krone/Extender: Robbie Louis in Treatment: 7 Vital Signs Time Taken: 07:42 Temperature (F): 97.8 Height (in): 70 Pulse (bpm): 88 Weight (lbs): 329 Respiratory Rate (breaths/min): 18 Body Mass Index (BMI): 47.2 Blood Pressure (mmHg): 128/85 Reference Range: 80 - 120 mg / dl Electronic Signature(s) Signed: 05/13/2023 11:53:39 AM By: Angelina Pih Entered By: Angelina Pih on 05/13/2023 04:42:18

## 2023-05-14 ENCOUNTER — Other Ambulatory Visit: Payer: Self-pay | Admitting: Primary Care

## 2023-05-14 DIAGNOSIS — E1165 Type 2 diabetes mellitus with hyperglycemia: Secondary | ICD-10-CM

## 2023-05-20 ENCOUNTER — Ambulatory Visit: Payer: 59 | Admitting: Physician Assistant

## 2023-06-07 DIAGNOSIS — Z794 Long term (current) use of insulin: Secondary | ICD-10-CM

## 2023-06-10 MED ORDER — INSULIN GLARGINE-YFGN 100 UNIT/ML ~~LOC~~ SOPN
40.0000 [IU] | PEN_INJECTOR | Freq: Every day | SUBCUTANEOUS | 0 refills | Status: DC
Start: 2023-06-10 — End: 2023-06-26

## 2023-06-11 NOTE — Unmapped (Signed)
[facility] Report in Careers information officer for your review.

## 2023-06-12 ENCOUNTER — Other Ambulatory Visit: Payer: Self-pay | Admitting: Primary Care

## 2023-06-12 DIAGNOSIS — E119 Type 2 diabetes mellitus without complications: Secondary | ICD-10-CM

## 2023-06-12 NOTE — Telephone Encounter (Signed)
Lvmtcb, sent mychart message  

## 2023-06-12 NOTE — Telephone Encounter (Signed)
Patient is due for diabetes follow up, this will be required prior to any further refills.  Please schedule, thank you!   

## 2023-06-19 ENCOUNTER — Other Ambulatory Visit: Payer: Self-pay | Admitting: Primary Care

## 2023-06-19 DIAGNOSIS — E119 Type 2 diabetes mellitus without complications: Secondary | ICD-10-CM

## 2023-06-19 MED ORDER — BD PEN NEEDLE MINI U/F 31G X 5 MM MISC: 0 refills | Status: AC

## 2023-06-23 ENCOUNTER — Other Ambulatory Visit: Payer: Self-pay

## 2023-06-23 DIAGNOSIS — E1165 Type 2 diabetes mellitus with hyperglycemia: Secondary | ICD-10-CM

## 2023-06-24 ENCOUNTER — Ambulatory Visit: Admit: 2023-06-24 | Discharge: 2023-06-25 | Payer: PRIVATE HEALTH INSURANCE

## 2023-06-24 DIAGNOSIS — I878 Other specified disorders of veins: Principal | ICD-10-CM

## 2023-06-24 DIAGNOSIS — K51919 Ulcerative colitis, unspecified with unspecified complications: Principal | ICD-10-CM

## 2023-06-24 DIAGNOSIS — B353 Tinea pedis: Principal | ICD-10-CM

## 2023-06-24 DIAGNOSIS — Z79899 Other long term (current) drug therapy: Principal | ICD-10-CM

## 2023-06-24 DIAGNOSIS — L88 Pyoderma gangrenosum: Principal | ICD-10-CM

## 2023-06-24 NOTE — Unmapped (Signed)
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   - Reviewed expected chronic, waxing/waning nature. Goal is management rather than cure.  - Continue infliximab 10 mg/kg every 6 weeks. Pt currently getting infusions through [facility].   - Ambulatory Referral to GI today for UC     High risk medication use: infliximab  - hepatitis panel (HBV cAb / sAb and HCV) negative 07/2019 -- HBV non-immune  - Continue cbc and cmp with infusions  - Quant gold negative 11/26/22     Pigmented Purpura, R leg  - Okay to continue moisturizer  - Recommend start urea cream    Probable tinea pedis complicated by venous stasis dermatitis   - Discussed condition is complicated by the veins in our legs becoming leaky and fluid, seeping into the tissue of your legs. This fluid can irritate the skin and cause swelling, itching, and redness.   - Informed the patient that this condition is caused by swelling in his legs and reducing this swelling is the ultimate treatment  - Encouraged to wear compression stockings/socks   - Recommended start a daily OTC anti-fungal, Lotrimin qAM and Urea 20% cream qPM, to the feet and to use a different nail clipper for the great toe than the unaffected toes to minimize spread to other nails.  - Counseled to spray OTC Tinactin to the shoes and feet daily   - Advised to clean showers with clorox spray     The patient was advised to call for an appointment should any new, changing, or symptomatic lesions develop.     RTC: Return in about 6 months (around 12/22/2023). or sooner as needed   _________________________________________________________________      Chief Complaint     Chief Complaint   Patient presents with    f/u on legs and remicade       area of concern is left leg       HPI Andrew Cameron is a 43 y.o. male who presents as a returning patient (last seen 11/26/2022) to Dermatology for follow up of pyoderma gangrenosum.     At last visit, continued infliximab for PG and recommended OTC lotrim and urea for probable tinea pedis. Recommended OTC Tinactin to shoes/feet daily.    Today:  - PG wound is doing well, had little opening/ulcer in November but has sine closed  - also notes itchy rash on right leg, has tried ketoconazole, Working Restaurant manager, fast food and clobetasol; clobetasol helped with itching but did not resolve the rash     The patient denies any other new or changing lesions or areas of concern.     Pertinent Past Medical History     Problem List       Pyoderma gangrenosum    High risk medication use - Primary    Ulcerative colitis (CMS-HCC)    Last Assessment & Plan:   Currently seems to be in remission. He was prior on asacol for several years. Does not have GI doctor at this time.         Relevant Orders    Ambulatory referral to Gastroenterology    Venous stasis    Last Assessment & Plan:   Chronic and appears to be progressing in the setting of new sores to the  right lower extremity.  Recent ultrasound ruled out DVT.   Continue follow-up with dermatology, wound clinic, infectious disease.          Ulcerative Colitis    Past Medical History, Family History, Social History, Medication List, Allergies, and Problem List were reviewed in the rooming section of Epic.     ROS: Other than symptoms mentioned in the HPI, no fevers, chills, or other skin complaints    Physical Examination     GENERAL: Well-appearing male in no acute distress, resting comfortably.  NEURO: Alert and oriented, answers questions appropriately  PSYCH: Normal mood and affect  RESP: No increased work of breathing  SKIN (Focal Skin Exam): Per patient request, examination of bilateral lower extremities and feet was performed  - well healed scar on L leg   - scale to feet and toes with cracking        All areas not commented on are within normal limits or unremarkable      (Approved Template 01/31/2020)

## 2023-06-26 ENCOUNTER — Encounter: Payer: Self-pay | Admitting: Primary Care

## 2023-06-26 ENCOUNTER — Ambulatory Visit: Payer: Managed Care, Other (non HMO) | Admitting: Primary Care

## 2023-06-26 VITALS — BP 116/74 | HR 82 | Temp 98.1°F | Ht 69.0 in | Wt 329.0 lb

## 2023-06-26 DIAGNOSIS — Z794 Long term (current) use of insulin: Secondary | ICD-10-CM | POA: Diagnosis not present

## 2023-06-26 DIAGNOSIS — E1165 Type 2 diabetes mellitus with hyperglycemia: Secondary | ICD-10-CM

## 2023-06-26 DIAGNOSIS — J3489 Other specified disorders of nose and nasal sinuses: Secondary | ICD-10-CM | POA: Insufficient documentation

## 2023-06-26 LAB — POCT GLYCOSYLATED HEMOGLOBIN (HGB A1C): Hemoglobin A1C: 6.1 % — AB (ref 4.0–5.6)

## 2023-06-26 MED ORDER — TIRZEPATIDE 15 MG/0.5ML ~~LOC~~ SOAJ: 15.0000 mg | SUBCUTANEOUS | 1 refills | Status: DC

## 2023-06-26 MED ORDER — INSULIN GLARGINE-YFGN 100 UNIT/ML ~~LOC~~ SOPN: 15.0000 [IU] | PEN_INJECTOR | Freq: Every day | SUBCUTANEOUS | Status: DC

## 2023-06-26 NOTE — Assessment & Plan Note (Signed)
 Symptoms likely viral at this point. Exam today reassuring.  Recommended Flonase  nasal spray, sinus rinses.  He will update early next week if symptoms progress.

## 2023-06-26 NOTE — Assessment & Plan Note (Signed)
 Significantly improved with A1C today of 6.1!  Reduce Semglee  to 15 units daily. Continue metformin  1000 mg daily. Increase Mounjaro  to 15 mg weekly for weight loss benefits.  Foot exam today.  Follow-up in 6 months with office visit, repeat A1c in 3 months.

## 2023-06-26 NOTE — Patient Instructions (Signed)
 I reduced your insulin  to 15 units daily.  We increased your Mounjaro  to 15 mg weekly for diabetes. Continue metformin .  Schedule a lab only appointment for 3 months.  Schedule your physical with me for 6 months.  It was a pleasure to see you today!

## 2023-06-26 NOTE — Progress Notes (Signed)
 Subjective:    Patient ID: [name], male    DOB: 1981/03/01, 43 y.o.   MRN: [id number]  HPI  [name] is a very pleasant 43 y.o. male with a history of type 2 diabetes, ulcerative colitis, chronic skin ulcer, hyperlipidemia, tobacco use who presents today for follow-up of diabetes and sinus pressure.  1) Type 2 Diabetes: Current medications include: Metformin  1000 mg twice daily, Mounjaro  12.5 mg weekly, Semglee  30 units daily.  He reduced his metformin  to 1000 mg once daily 6 months ago.   He is checking his blood glucose continuously and is getting readings of:  AM fasting: 99-105 2 hours after lunch: low 100s Bedtime: low 100s  He does drop into the high 60s at least once daily, sometimes in the 50s when he hasn't eaten.   Last A1C: 7.6 in August 2024, 6.1 today Last Eye Exam: UTD Last Foot Exam: Due Pneumonia Vaccination: 2024 Urine Microalbumin: UTD Statin: atorvastatin    Dietary changes since last visit: Reduced appetite, smaller portion sizes.    Exercise: Active at work  2) Sinus Pressure: Acute onset 3 days ago with sore throat. Then he developed cough, yellow drainage, left maxillary sinus pressure.   He's been using Vicks nasal spray. He denies fevers but feels fatigued.   Wt Readings from Last 3 Encounters:  06/26/23 (!) 329 lb (149.2 kg)  12/31/22 (!) 330 lb (149.7 kg)  12/02/22 (!) 333 lb (151 kg)   BP Readings from Last 3 Encounters:  06/26/23 116/74  12/31/22 132/68  12/02/22 110/80      Review of Systems  Constitutional:  Positive for fatigue. Negative for chills and fever.  HENT:  Positive for congestion and sinus pressure. Negative for sore throat.   Respiratory:  Positive for cough. Negative for shortness of breath.          Past Medical History:  Diagnosis Date   Acute breast pain 11/22/2021   Acute foot pain, right 07/31/2021   Blood in stool    Depression    Elevated blood pressure    Hyperlipidemia    OSA (obstructive  sleep apnea) 2014   Seasonal allergies    Ulcerative colitis (HCC) 03/05/2018   Wound infection 07/25/2020    Social History   Socioeconomic History   Marital status: Married    Spouse name: Not on file   Number of children: Not on file   Years of education: Not on file   Highest education level: Not on file  Occupational History   Not on file  Tobacco Use   Smoking status: Every Day    Current packs/day: 0.00    Average packs/day: 1 pack/day for 14.0 years (14.0 ttl pk-yrs)    Types: Cigarettes    Start date: 11/24/2004    Last attempt to quit: 11/25/2018    Years since quitting: 4.5   Smokeless tobacco: Former    Quit date: 03/17/2015  Vaping Use   Vaping status: Never Used  Substance and Sexual Activity   Alcohol use: Not Currently    Alcohol/week: 0.0 standard drinks of alcohol    Comment: soical   Drug use: Never   Sexual activity: Not on file  Other Topics Concern   Not on file  Social History Narrative   Married.   7 kids.   Works as a magazine features editor at [employer].   Enjoys target shooting.    Social Drivers of Corporate Investment Banker Strain: Not on file  Food Insecurity: Not  on file  Transportation Needs: Not on file  Physical Activity: Not on file  Stress: Not on file  Social Connections: Not on file  Intimate Partner Violence: Not on file    Past Surgical History:  Procedure Laterality Date   SEPTOPLASTY  2007   WRIST SURGERY      Family History  Problem Relation Age of Onset   Multiple sclerosis Mother    Dementia Mother    Alcohol abuse Paternal Aunt    Alcohol abuse Paternal Uncle    Stroke Paternal Uncle    Hyperlipidemia Maternal Grandfather    Hypertension Maternal Grandfather    Diabetes Maternal Grandfather    Alcohol abuse Maternal Grandfather    Breast cancer Neg Hx     Allergies  Allergen Reactions   Biaxin [Clarithromycin] Other (See Comments)    Causes colitis flares   Milk-Related Compounds Hives    Current Outpatient  Medications on File Prior to Visit  Medication Sig Dispense Refill   acetaminophen (TYLENOL) 500 MG tablet Take 1,000 mg by mouth every 6 (six) hours as needed for headache (pain).     atorvastatin  (LIPITOR) 40 MG tablet Take 1 tablet (40 mg total) by mouth daily. for cholesterol. 90 tablet 2   blood glucose meter kit and supplies KIT Dispense based on patient and insurance preference. Use up to four times daily as directed. (FOR ICD-9 250.00, 250.01). 1 each 0   buPROPion  ER (WELLBUTRIN  SR) 100 MG 12 hr tablet Take 1 tablet (100 mg total) by mouth 2 (two) times daily. For depression 180 tablet 2   clobetasol ointment (TEMOVATE) 0.05 % Apply 1 application  topically 2 (two) times daily.     Continuous Blood Gluc Receiver (DEXCOM G6 RECEIVER) DEVI 1 Device by Does not apply route continuous. 1 each 0   Continuous Glucose Sensor (DEXCOM G6 SENSOR) MISC USE TO CHECK BLOOD SUGAR CONTINUOUSLY. CHANGE EVERY 10 DAYS 3 each 5   Continuous Glucose Transmitter (DEXCOM G6 TRANSMITTER) MISC USE CONTINUOUSLY TO CHECK BLOOD SUGAR 1 each 1   cyclobenzaprine  (FLEXERIL ) 5 MG tablet Take 1-2 tablets (5-10 mg total) by mouth 3 (three) times daily as needed for muscle spasms. 30 tablet 0   hydrocortisone 2.5 % ointment Apply topically.     ibuprofen (ADVIL,MOTRIN) 200 MG tablet Take 400-600 mg by mouth every 6 (six) hours as needed for headache (pain).     inFLIXimab (REMICADE) 100 MG injection Inject into the muscle once a week.     Insulin  Pen Needle (B-D UF III MINI PEN NEEDLES) 31G X 5 MM MISC USE 1 EVERY NIGHT AT BEDTIME WITH INSULIN  AS DIRECTED. 30 each 0   ketoconazole (NIZORAL) 2 % cream Apply topically 2 (two) times daily.     metFORMIN  (GLUCOPHAGE ) 1000 MG tablet TAKE 1 TABLET BY MOUTH TWICE DAILY WITH MEALS FOR DIABETES 60 tablet 0   terbinafine  (LAMISIL ) 250 MG tablet Take 1 tablet (250 mg total) by mouth daily. For athletes foot 30 tablet 0   triamcinolone  ointment (KENALOG ) 0.1 % Apply topically.      venlafaxine  XR (EFFEXOR -XR) 150 MG 24 hr capsule TAKE 1 CAPSULE(150 MG) BY MOUTH DAILY WITH BREAKFAST FOR ANXIETY OR DEPRESSION 90 capsule 2   No current facility-administered medications on file prior to visit.    BP 116/74   Pulse 82   Temp 98.1 F (36.7 C) (Temporal)   Ht 5' 9 (1.753 m)   Wt (!) 329 lb (149.2 kg)   SpO2 98%  BMI 48.58 kg/m  Objective:   Physical Exam Constitutional:      Appearance: He is not ill-appearing.  Cardiovascular:     Rate and Rhythm: Normal rate and regular rhythm.  Pulmonary:     Effort: Pulmonary effort is normal.     Breath sounds: Examination of the left-upper field reveals wheezing. Wheezing present.  Musculoskeletal:     Cervical back: Neck supple.  Skin:    General: Skin is warm and dry.  Neurological:     Mental Status: He is alert and oriented to person, place, and time.  Psychiatric:        Mood and Affect: Mood normal.           Assessment & Plan:  Type 2 diabetes mellitus with hyperglycemia, with long-term current use of insulin  (HCC) Assessment & Plan: Significantly improved with A1C today of 6.1!  Reduce Semglee  to 15 units daily. Continue metformin  1000 mg daily. Increase Mounjaro  to 15 mg weekly for weight loss benefits.  Foot exam today.  Follow-up in 6 months with office visit, repeat A1c in 3 months.  Orders: -     POCT glycosylated hemoglobin (Hb A1C) -     Insulin  Glargine-yfgn; Inject 15 Units into the skin daily.  Sinus pressure Assessment & Plan: Symptoms likely viral at this point. Exam today reassuring.  Recommended Flonase  nasal spray, sinus rinses.  He will update early next week if symptoms progress.   Other orders -     Tirzepatide ; Inject 15 mg into the skin once a week. for diabetes.  Dispense: 6 mL; Refill: 1        [name] MARLA Gaskins, NP

## 2023-07-04 DIAGNOSIS — J3489 Other specified disorders of nose and nasal sinuses: Secondary | ICD-10-CM

## 2023-07-04 MED ORDER — AMOXICILLIN-POT CLAVULANATE 875-125 MG PO TABS: 1.0000 | ORAL_TABLET | Freq: Two times a day (BID) | ORAL | 0 refills | Status: DC

## 2023-07-04 NOTE — Addendum Note (Signed)
Addended by: [name] on: 07/04/2023 06:11 PM   Modules accepted: Orders

## 2023-07-04 NOTE — Telephone Encounter (Signed)
I see where you seen him in the office on 2/6 and evaluated him for this. Do you need to see him?

## 2023-07-14 ENCOUNTER — Other Ambulatory Visit: Payer: Self-pay

## 2023-07-14 DIAGNOSIS — E119 Type 2 diabetes mellitus without complications: Secondary | ICD-10-CM

## 2023-07-15 MED ORDER — METFORMIN HCL 1000 MG PO TABS: 1000.0000 mg | ORAL_TABLET | Freq: Two times a day (BID) | ORAL | 1 refills | Status: AC

## 2023-07-24 NOTE — Unmapped (Signed)
 [facility] report in Careers information officer for review.

## 2023-09-03 NOTE — Unmapped (Signed)
 [facility] Report in Careers information officer for review.

## 2023-09-04 ENCOUNTER — Other Ambulatory Visit: Payer: Self-pay | Admitting: Primary Care

## 2023-09-04 DIAGNOSIS — Z794 Long term (current) use of insulin: Secondary | ICD-10-CM

## 2023-09-11 ENCOUNTER — Other Ambulatory Visit (HOSPITAL_COMMUNITY): Payer: Self-pay

## 2023-09-11 ENCOUNTER — Telehealth: Payer: Self-pay

## 2023-09-11 ENCOUNTER — Ambulatory Visit: Admit: 2023-09-11 | Discharge: 2023-09-12 | Payer: PRIVATE HEALTH INSURANCE

## 2023-09-11 DIAGNOSIS — K51919 Ulcerative colitis, unspecified with unspecified complications: Principal | ICD-10-CM

## 2023-09-11 LAB — CBC W/ AUTO DIFF
BASOPHILS ABSOLUTE COUNT: 0 10*9/L (ref 0.0–0.1)
BASOPHILS RELATIVE PERCENT: 0.4 %
EOSINOPHILS ABSOLUTE COUNT: 0.4 10*9/L (ref 0.0–0.5)
EOSINOPHILS RELATIVE PERCENT: 3.1 %
HEMATOCRIT: 46.7 % (ref 39.0–48.0)
HEMOGLOBIN: 15.8 g/dL (ref 12.9–16.5)
LYMPHOCYTES ABSOLUTE COUNT: 4 10*9/L — ABNORMAL HIGH (ref 1.1–3.6)
LYMPHOCYTES RELATIVE PERCENT: 32.7 %
MEAN CORPUSCULAR HEMOGLOBIN CONC: 33.8 g/dL (ref 32.0–36.0)
MEAN CORPUSCULAR HEMOGLOBIN: 30.1 pg (ref 25.9–32.4)
MEAN CORPUSCULAR VOLUME: 89.1 fL (ref 77.6–95.7)
MEAN PLATELET VOLUME: 6.9 fL (ref 6.8–10.7)
MONOCYTES ABSOLUTE COUNT: 0.9 10*9/L — ABNORMAL HIGH (ref 0.3–0.8)
MONOCYTES RELATIVE PERCENT: 7.3 %
NEUTROPHILS ABSOLUTE COUNT: 6.9 10*9/L (ref 1.8–7.8)
NEUTROPHILS RELATIVE PERCENT: 56.5 %
PLATELET COUNT: 255 10*9/L (ref 150–450)
RED BLOOD CELL COUNT: 5.24 10*12/L (ref 4.26–5.60)
RED CELL DISTRIBUTION WIDTH: 12.9 % (ref 12.2–15.2)
WBC ADJUSTED: 12.2 10*9/L — ABNORMAL HIGH (ref 3.6–11.2)

## 2023-09-11 LAB — IRON & TIBC
IRON SATURATION: 16 % — ABNORMAL LOW (ref 20–55)
IRON: 53 ug/dL — ABNORMAL LOW (ref 65–175)
TOTAL IRON BINDING CAPACITY: 322 ug/dL (ref 250–425)

## 2023-09-11 LAB — COMPREHENSIVE METABOLIC PANEL
ALBUMIN: 4.2 g/dL (ref 3.4–5.0)
ALKALINE PHOSPHATASE: 125 U/L — ABNORMAL HIGH (ref 46–116)
ALT (SGPT): 41 U/L (ref 10–49)
ANION GAP: 9 mmol/L (ref 5–14)
AST (SGOT): 26 U/L (ref <=34)
BILIRUBIN TOTAL: 0.4 mg/dL (ref 0.3–1.2)
BLOOD UREA NITROGEN: 10 mg/dL (ref 9–23)
BUN / CREAT RATIO: 10
CALCIUM: 9.4 mg/dL (ref 8.7–10.4)
CHLORIDE: 104 mmol/L (ref 98–107)
CO2: 24.7 mmol/L (ref 20.0–31.0)
CREATININE: 0.98 mg/dL (ref 0.73–1.18)
EGFR CKD-EPI (2021) MALE: >90 mL/min/1.73m2 (ref >=60)
GLUCOSE RANDOM: 68 mg/dL — ABNORMAL LOW (ref 70–99)
POTASSIUM: 3.7 mmol/L (ref 3.4–4.8)
PROTEIN TOTAL: 7.6 g/dL (ref 5.7–8.2)
SODIUM: 138 mmol/L (ref 135–145)

## 2023-09-11 LAB — FERRITIN: FERRITIN: 86.8 ng/mL (ref 10.5–307.3)

## 2023-09-11 LAB — GAMMA GT: GAMMA GLUTAMYL TRANSFERASE: 70 U/L (ref 0–73)

## 2023-09-11 LAB — C-REACTIVE PROTEIN: C-REACTIVE PROTEIN: <5 mg/L (ref <=10.0)

## 2023-09-11 NOTE — Unmapped (Signed)
 [facility]  CONSULT NOTE - INFLAMMATORY BOWEL DISEASE  09/11/2023    Demographics:  Andrew Cameron is a 43 y.o. year old male    Referring physician:   Jaci Martini, MD  [address]  [address]  [place],  [place] [address]          Assessment & Recommendations:     Andrew Cameron is a 43 y.o. male with PMH notable for Pyoderma Gangrenosum, depression, HLD who presents for ulcerative colitis (extent of involvement unknown). He was diagnosed with UC in 2003 and treated with Asacol for ~3-4 years. Around 2018 he was diagnosed with pyoderma gangrenosum and was on steroids and abx for about a year. His dermatologist ultimately started him ILK10 and then on adalimumab . He had a primary non response to adalimumab . He has been on infliximab  monotherapy since mid 2021, on 10mg /kg every 6 weeks. He is in symptomatic remission from a UC standpoint and PG is improving on the current regimen. He is due for colon cancer screening.     PLAN:  1. Ulcerative colitis, unknown extent  Currently in symptomatic remission, but due for colonoscopy    - continue infliximab  10mg /kg q6h, as ordered by dermatology; encourage proactive drug monitoring while on monotherapy   - schedule colonoscopy for dysplasia surveillance as well disease activity assessment   - check Vit D, B12, iron labs     2. High risk medication use   - Safety monitoring labs: CBC, CMP, CRP    3. Pyoderma   Currently improving   - continue to follow with dermatology     IBD Healthcare Maintenance   Last colorectal cancer screening  (q1-5 years, 8 years after diagnosis) 2020, due now    Metabolic bone disease screening  (steroids >74mo, past steroid use >11yr in past 7yr, maternal FHx osteoporosis, underweight, amenorrheic, postmenopausal women) Previous steriod use, discuss bone density scan with PCP    Vitamin D deficiency screening  (q28yr) Check today    Vitamin B12 deficiency screening  (If PSHx of ileal resection) N/A    Iron deficiency screening Check today Vaccines Varicella  (Zoster IgG, consider vaccination if -) N/A     Herpes Zoster  (Anticipating IS) N/A     Tdap  (TDAP q10y or Td q2y) Discuss with PCP     Influenza  (q60yr) recommended    HPV  (Age 57-26)     Hepatitis A     Hepatitis B Non immune based on labs from 2021, check today     Meningococcus  (At risk--college, military) N/A     Pneumococcus  (PSV23 if not IS; PCV13 then PSV23 after 8w if IS; PSV23 booster q70yr) PCV20 12/2022   Annual Pap smear  (if male and IS) NA    Annual dermatology exam  (if IS) Follows closely    Tobacco abstinence Work in progress, encouraged   NSAID abstinence encouraged      I spent a total of 60 minutes on the date of this encounter in the delivery of care to this patient.     Patient seen and discussed with Dr. Katheran Cameron who is in agreement with above assessment and plan.    Andrew Dk, MD PHD  Fellow Physician - Division of Gastroenterology & Hepatology  [facility]              HPI / NOTE :     Today, I saw Andrew Cameron for initial consultation in the  [facility] at the request of Dr. Georgian Cameron regarding Ulcerative Colitis.  Outside records including available clinical notes, endoscopy reports, imaging results and pathology results were reviewed in detail as part of this initial consultation.     HPI:  12 male with PMH notable for Pyoderma Gangrenosum, UC, Depression, HLD who presents for ulcerative colitis.   He was diagnosed with UC in 2003 and treated with Asacol for ~3-4 years. Around 2018   he was diagnosed with pyoderma gangrenosum and was on steroids and abx for about a year. His dermatologist ultimately started him ILK10 and then on adalimumab . He had a primary non response to adalimumab .   He has been on infliximab  since mid 2021, on 10mg /kg every 6 weeks.     He last saw Dermatology 06/24/2023 and at that point had been doing well.     He can tell when he is due for an infusion based on itching of his skin.     Current symptoms are:   Abdominal pain (0-10):  0  N/V: none   BM a day: 0-1 , consistent with baseline; feels more constipated 1-2 days after taking trulicity    Consistency: hard at times immediately after infusions or trulicity  injections; otherwise soft, formed   % of stools have blood: 0   Nocturnal BM: 0   Urgency: rare    Weight change over last 12mo: 30 lb weight loss intentional weight loss on trulicty   Smoking: < 1 PPD; previously up to 2 packs per day, currently on wellbutrin to help with smoking cessation   NSAIDS: ibuprofen once or twice a month for back pain   Upper GI symptoms: acid reflux resolved, no dysphagia or odynophagia     Last infusion was 4/15.     Wt Readings from Last 12 Encounters:   09/11/23 (!) 144.2 kg (318 lb)   06/15/19 (!) 153.8 kg (339 lb)   06/09/19 (!) 154.2 kg (340 lb)   01/05/19 (!) 157.4 kg (347 lb)   11/17/18 (!) 156.5 kg (345 lb)   10/05/18 (!) 159.7 kg (352 lb)              IBD HISTORY:     Year of disease onset:  2003  Diagnosis:  Ulcerative Colitis  Age at onset:   46-40 yr old (A2)  Location:  unknown  Behavior:  Colitis  Perianal Dz:  No    Brief IBD Disease Course:    Diagnosed with UC in 2003 and treated with Asacol for ~3-4 years, then off therapy for at least 10 years. Around 2018 he was diagnosed with pyoderma gangrenosum and was on steroids and abx for about a year. His dermatologist ultimately started him ILK10 and then on adalimumab . He had a primary non response to adalimumab .   He has been on infliximab  monotherapy since mid 2021, on 10mg /kg every 6 weeks. He is in symptomatic remission from a UC standpoint and PG is currently improving     Endoscopy:      Colonoscopy 11/17/2018  The perianal and digital rectal examinations were normal.       The colon (entire examined portion) appeared normal. There was no        macroscopic endoscopic evidence of inflammation. Biopsies were taken        with a cold forceps for histology in the right and left colon.       The terminal ileum appeared normal.       The retroflexed  view of the distal rectum and anal verge was normal and        showed no anal or rectal abnormalities    Imaging:    US  abdomen, limited RUQ 12/2020  - No focal lesion identified. Diffusely increased echogenicity of the   liver parenchyma is seen. Portal vein is patent on color Doppler   imaging with normal direction of blood flow towards the liver.     Prior IBD medications (type, dose, duration, response):  [x]  5-ASAs  []  Oral corticosteroids   []  Intravenous corticosteroids  []  Antibiotics  []  Thiopurines  []  Methotrexate   [x]  Anti-TNF therapies  []  Anti-Integrin therapies  []  Anti-Interleukin therapies  []  Anti-JAK therapies  []  Cyclosporine  []  Clinical trial medication  []  Other (Please specify):    Extraintestinal manifestations:   -joint pains affecting: No.  -eye: No.  -skin: Yes.  -oral ulcers :  No.  -blood clots: No.  -PSC: No.  -other: No.          Past Medical History:   Past medical history:   Past Medical History:   Diagnosis Date    Diabetes mellitus     Normocytic anemia 07/07/2019    Venous insufficiency      Past surgical history:   Past Surgical History:   Procedure Laterality Date    PR COLONOSCOPY W/BIOPSY SINGLE/MULTIPLE N/A 11/17/2018    Procedure: COLONOSCOPY, FLEXIBLE, PROXIMAL TO SPLENIC FLEXURE; WITH BIOPSY, SINGLE OR MULTIPLE;  Surgeon: Loel Ring, MD;  [facility];  Service: Gastroenterology    SKIN BIOPSY       Family history:   Family History   Problem Relation Age of Onset    Arthritis Maternal Grandfather     Depression Maternal Grandfather     Diabetes Maternal Grandfather     COPD Brother     COPD Maternal Grandmother     Vision loss Maternal Grandmother     Melanoma Neg Hx     Basal cell carcinoma Neg Hx     Squamous cell carcinoma Neg Hx      Social history:   Social History     Socioeconomic History    Marital status: Married   Tobacco Use    Smoking status: Some Days     Current packs/day: 0.00     Types: Cigarettes     Last attempt to quit: 11/25/2018     Years since quitting: 4.7    Smokeless tobacco: Never   Substance and Sexual Activity    Alcohol use: Never    Drug use: Never    Sexual activity: Yes     Partners: Female     Birth control/protection: Bilateral Tubal Ligation   Other Topics Concern    Do you use sunscreen? No    Tanning bed use? Yes    Are you easily burned? No    Excessive sun exposure? No    Blistering sunburns? No             Allergies:     Allergies   Allergen Reactions    Lactalbumin Hives    Clarithromycin Other (See Comments)     Causes colitis flares             Medications:     Current Outpatient Medications   Medication Sig Dispense Refill    acetaminophen (TYLENOL) 500 MG tablet Take 2 tablets (1,000 mg total) by mouth.      atorvastatin  (LIPITOR) 40 MG tablet TAKE  1 TABLET BY MOUTH IN THE EVENING FOR CHOLESTEROL      BD ULTRA-FINE MINI PEN NEEDLE 31 gauge x 3/16 (5 mm) Ndle USE 1 EVERY NIGHT AT BEDTIME WITH INSULIN AS DIRECTED.      cetirizine (ZYRTEC) 10 MG tablet Take 1 tablet (10 mg total) by mouth.      DEXCOM G7 SENSOR Devi APPLY EVERY 10 DAYS TO CHECK BLOOD SUGARS AS DIRECTED      dulaglutide  (TRULICITY ) 0.75 mg/0.5 mL injection pen Inject 0.5 mL (0.75 mg total) under the skin.      fluticasone (FLONASE) 50 mcg/actuation nasal spray       ibuprofen (ADVIL,MOTRIN) 200 MG tablet Take 2-3 tablets (400-600 mg total) by mouth.      insulin glargine (LANTUS SOLOSTAR U-100 INSULIN) 100 unit/mL (3 mL) injection pen Inject 0.1 mL (10 Units total) under the skin nightly.      insulin glargine (LANTUS SOLOSTAR U-100 INSULIN) 100 unit/mL (3 mL) injection pen Inject 0.4 mL (40 Units total) under the skin nightly.      ketoconazole  (NIZORAL ) 2 % cream Apply 1 application topically Two (2) times a day. To affected areas as directed 30 g 5    levoFLOXacin (LEVAQUIN) 750 MG tablet TAKE 1 TABLET BY MOUTH ONCE DAILY FOR 10 DAYS      metFORMIN (GLUCOPHAGE) 1000 MG tablet Take 1 tablet (1,000 mg total) by mouth two (2) times a day.      MOUNJARO 2.5 mg/0.5 mL PnIj ADMINISTER 2.5 MG UNDER THE SKIN 1 TIME A WEEK FOR DIABETES      pen needle, diabetic 31 gauge x 15/64 Ndle USE NIGHTLY WITH INSULIN      TRULICITY  4.5 mg/0.5 mL PnIj INJECT 4.5 MG UNDER THE SKIN ONCE WEEKLY FOR DIABETES AS DIRECTED      venlafaxine (EFFEXOR-XR) 150 MG 24 hr capsule Take 1 capsule (150 mg total) by mouth.       Current Facility-Administered Medications   Medication Dose Route Frequency Provider Last Rate Last Admin    triamcinolone  acetonide (KENALOG ) injection 10 mg  10 mg Intradermal Once Pearlstein, Roz Cornelia, MD        triamcinolone  acetonide (KENALOG -40) injection 40 mg  40 mg Other Once Pearlstein, Roz Cornelia, MD         Also taking wellbutrin           Physical Exam:   BP 125/82 (BP Site: L Arm, BP Position: Sitting)  - Pulse 85  - Temp 36.8 ??C (98.3 ??F) (Temporal)  - Ht 177.8 cm (5' 10)  - Wt (!) 144.2 kg (318 lb)  - SpO2 95%  - BMI 45.63 kg/m??     GEN: no apparent distress, appears comfortable on exam  HEENT:  OP clear with no erythema, lesions, exudate, mucous membranes moist  NEURO:  gait normal, non-focal, no obvious neurologic abnormality  NECK: Supple, no lymphadenopathy  LUNGS: breathing comfortably on RA   CV: regular rate   ABD: Soft, nontender, nondistended, normoactive bowel sounds, no rebound/guarding, no appreciable organomegaly  Extremities: no cyanosis, clubbing or edema, normal gait  Psych: affect appropriate, A&O x3  SKIN: healing PG on LLE   PERIANAL / RECTAL EXAM:   Deferred          Labs, Data & Indices:     Lab Review:   Lab Results   Component Value Date    WBC 10.8 12/13/2020    WBC 6.9 09/16/2019    RBC 5.10 12/13/2020  RBC 4.77 09/16/2019    HGB 15.4 12/13/2020    HGB 13.8 09/16/2019     Lab Results   Component Value Date    AST 36 (H) 12/13/2020    AST 32 09/16/2019    ALT 66 (H) 12/13/2020    ALT 52 (H) 09/16/2019    BUN 6 (L) 12/13/2020 BUN 12 09/16/2019    Creatinine 0.84 12/13/2020    Creatinine 0.99 09/16/2019    CO2 28.0 06/11/2018    Albumin 3.9 06/11/2018    Calcium 9.1 06/11/2018     Lab Results   Component Value Date    TSH 3.080 03/07/2017        No results found for: INFLIXIMAB   No results found for: ANTIINFLXAB

## 2023-09-11 NOTE — Unmapped (Signed)
 [name],   Andrew Cameron were seen today to discuss your history of ulcerative colitis. We are glad to learn that you are doing well from a GI standpoint.     We recommend the following:  1. Labs today  2. Schedule a colonoscopy -- call [phone]   3. Return in 1 year with Omelia Bibles, sooner if needed based on symptoms.    APPOINTMENT SCHEDULING FOR GI CLINIC AND GI PROCEDURES:  GI MEDICINE CLINIC  [phone] option 1   GI PROCEDURES         [phone] option 2   *To schedule, reschedule, or cancel your GI appointment, please call [phone]. If you are unable to come to an appointment, please notify us  as soon as possible, preferably 24 hours in advance. Doing so may allow other patients with urgent needs to be scheduled in a cancelled appointment slot.   RADIOLOGY - to schedule imaging ordered, please call [phone] opt 1     IBD NURSE COORDINATOR CONTACT - Cathern Clover Yopp, RN  Phone: [phone] (direct line)   Fax: [phone]  * For urgent medical concerns after hours or on weekends and holidays, call [phone] and ask to speak to the GI Fellow on call.    * If you have a GI medical question or GI symptoms and would like to speak to your provider???s healthcare team, please contact Cathern Clover Yopp (contact information above) OR you can send the GI healthcare team a message through MyChart at [email or web address]    TEST RESULTS   If you have a MyChart account, your new results and a provider message will be sent to you through your MyChart account at [email or web address]. For results that require follow-up, a member of your healthcare team will also contact you directly.    PRESCRIPTION REFILL REQUESTS  To request prescription refills, please contact your pharmacy or send your healthcare team a message through your MyChart account at [email or web address]  RECORD REQUESTS  For questions related to medical records, please call Medical Records Release of Information at [phone]  FINANCIAL COUNSELOR   For billing and other financial questions/needs - please contact Reginald Reavis at [phone]. If you need to leave a message, please be sure to leave your full name, date of birth or MR#, best call back # and reason for call.

## 2023-09-11 NOTE — Telephone Encounter (Signed)
 Pharmacy Patient Advocate Encounter  Received notification from [employer] that Prior Authorization for Mounjaro  15MG /0.5ML auto-injectors has been APPROVED from 3.25.25 to 4.24.26. Ran test claim, Copay is $25.00. This test claim was processed through [facility]- copay amounts may vary at other pharmacies due to pharmacy/plan contracts, or as the patient moves through the different stages of their insurance plan.   PA #/Case ID/Reference #: (Key: [id number])

## 2023-09-12 LAB — HEPATITIS B CORE ANTIBODY, TOTAL: HEPATITIS B CORE TOTAL ANTIBODY: NONREACTIVE

## 2023-09-12 LAB — HEPATITIS B SURFACE ANTIBODY
HEPATITIS B SURFACE ANTIBODY QUANT: <8 m[IU]/mL (ref <8.00)
HEPATITIS B SURFACE ANTIBODY: NONREACTIVE

## 2023-09-12 LAB — HEPATITIS B SURFACE ANTIGEN: HEPATITIS B SURFACE ANTIGEN: NONREACTIVE

## 2023-09-15 LAB — VITAMIN D 25 HYDROXY: VITAMIN D, TOTAL (25OH): 25.1 ng/mL (ref 20.0–80.0)

## 2023-09-18 ENCOUNTER — Other Ambulatory Visit: Payer: Self-pay | Admitting: Primary Care

## 2023-09-18 DIAGNOSIS — F3342 Major depressive disorder, recurrent, in full remission: Secondary | ICD-10-CM

## 2023-09-22 ENCOUNTER — Other Ambulatory Visit: Payer: Self-pay | Admitting: Primary Care

## 2023-09-22 DIAGNOSIS — E785 Hyperlipidemia, unspecified: Secondary | ICD-10-CM

## 2023-09-23 ENCOUNTER — Other Ambulatory Visit: Payer: Managed Care, Other (non HMO)

## 2023-09-25 DIAGNOSIS — R7989 Other specified abnormal findings of blood chemistry: Principal | ICD-10-CM

## 2023-10-06 ENCOUNTER — Other Ambulatory Visit

## 2023-10-27 ENCOUNTER — Other Ambulatory Visit: Payer: Self-pay

## 2023-10-27 DIAGNOSIS — E1165 Type 2 diabetes mellitus with hyperglycemia: Secondary | ICD-10-CM

## 2023-10-27 MED ORDER — DEXCOM G6 TRANSMITTER MISC: 1 refills | Status: DC

## 2023-10-31 ENCOUNTER — Telehealth: Payer: Self-pay

## 2023-10-31 NOTE — Telephone Encounter (Signed)
 error

## 2023-11-07 DIAGNOSIS — E1165 Type 2 diabetes mellitus with hyperglycemia: Secondary | ICD-10-CM

## 2023-11-07 MED ORDER — DEXCOM G7 SENSOR MISC: 3 refills | Status: AC

## 2023-11-07 MED ORDER — DEXCOM G7 RECEIVER DEVI: 3 refills | Status: AC

## 2023-11-07 MED ORDER — DEXCOM G6 SENSOR MISC: 1 refills | Status: DC

## 2023-11-13 ENCOUNTER — Telehealth: Payer: Self-pay

## 2023-11-13 ENCOUNTER — Other Ambulatory Visit (HOSPITAL_COMMUNITY): Payer: Self-pay

## 2023-11-13 NOTE — Telephone Encounter (Signed)
 Pharmacy Patient Advocate Encounter   Received notification from Physician's Office that prior authorization for Dexcom G7 sensors is required/requested.   Insurance verification completed.   The patient is insured through [employer] .   Per test claim: PA required; PA submitted to above mentioned insurance via CoverMyMeds Key/confirmation #/EOC [id number] Status is pending

## 2023-11-14 ENCOUNTER — Other Ambulatory Visit (HOSPITAL_COMMUNITY): Payer: Self-pay

## 2023-11-14 NOTE — Telephone Encounter (Signed)
 Pharmacy Patient Advocate Encounter  Received notification from [employer] that Prior Authorization for Dexcom G7 sensors has been APPROVED from 10/15/23 to 11/13/24. Ran test claim, Copay is $0.00. This test claim was processed through [facility]- copay amounts may vary at other pharmacies due to pharmacy/plan contracts, or as the patient moves through the different stages of their insurance plan.   PA #/Case ID/Reference #: [id number]

## 2023-11-14 NOTE — Telephone Encounter (Signed)
 Clinical questions have been answered and PA submitted. PA currently Pending. Please be advised that most companies allow up to 30 days to make a decision. We will advise when a determination has been made, or follow up in 1 week.   Please reach out to our team, Rx Prior Auth Pool, if you haven't heard back in a week.

## 2023-11-26 NOTE — Unmapped (Signed)
 [facility] Report in Careers information officer for review.

## 2023-12-08 NOTE — Unmapped (Signed)
 [facility] Report in Careers information officer for review

## 2023-12-10 ENCOUNTER — Other Ambulatory Visit: Payer: Self-pay

## 2023-12-10 DIAGNOSIS — E1165 Type 2 diabetes mellitus with hyperglycemia: Secondary | ICD-10-CM

## 2023-12-11 MED ORDER — TIRZEPATIDE 15 MG/0.5ML ~~LOC~~ SOAJ
15.0000 mg | SUBCUTANEOUS | 0 refills | Status: DC
Start: 2023-12-11 — End: 2024-02-24

## 2023-12-15 ENCOUNTER — Other Ambulatory Visit: Payer: Self-pay

## 2023-12-15 DIAGNOSIS — Z720 Tobacco use: Secondary | ICD-10-CM

## 2023-12-15 MED ORDER — BUPROPION HCL ER (SR) 100 MG PO TB12: 100.0000 mg | ORAL_TABLET | Freq: Two times a day (BID) | ORAL | 0 refills | Status: DC

## 2023-12-18 NOTE — Unmapped (Signed)
 LOV: 06/24/23    Hello Dr. Alpha,    Infliximab  orders were faxed 7/30, fax was confirmed and form uploaded.    TY Darice

## 2023-12-24 ENCOUNTER — Other Ambulatory Visit: Payer: Self-pay | Admitting: Primary Care

## 2023-12-24 DIAGNOSIS — E785 Hyperlipidemia, unspecified: Secondary | ICD-10-CM

## 2023-12-24 DIAGNOSIS — F3342 Major depressive disorder, recurrent, in full remission: Secondary | ICD-10-CM

## 2024-01-02 ENCOUNTER — Ambulatory Visit: Payer: Self-pay | Admitting: Primary Care

## 2024-01-02 ENCOUNTER — Ambulatory Visit (INDEPENDENT_AMBULATORY_CARE_PROVIDER_SITE_OTHER): Payer: Managed Care, Other (non HMO) | Admitting: Primary Care

## 2024-01-02 ENCOUNTER — Encounter: Payer: Self-pay | Admitting: Primary Care

## 2024-01-02 VITALS — BP 118/66 | HR 85 | Temp 98.0°F | Ht 69.0 in | Wt 305.0 lb

## 2024-01-02 DIAGNOSIS — K5190 Ulcerative colitis, unspecified, without complications: Secondary | ICD-10-CM

## 2024-01-02 DIAGNOSIS — E1165 Type 2 diabetes mellitus with hyperglycemia: Secondary | ICD-10-CM

## 2024-01-02 DIAGNOSIS — E785 Hyperlipidemia, unspecified: Secondary | ICD-10-CM | POA: Diagnosis not present

## 2024-01-02 DIAGNOSIS — Z794 Long term (current) use of insulin: Secondary | ICD-10-CM | POA: Diagnosis not present

## 2024-01-02 DIAGNOSIS — I10 Essential (primary) hypertension: Secondary | ICD-10-CM | POA: Diagnosis not present

## 2024-01-02 DIAGNOSIS — L88 Pyoderma gangrenosum: Secondary | ICD-10-CM

## 2024-01-02 DIAGNOSIS — F3342 Major depressive disorder, recurrent, in full remission: Secondary | ICD-10-CM

## 2024-01-02 DIAGNOSIS — Z0000 Encounter for general adult medical examination without abnormal findings: Secondary | ICD-10-CM

## 2024-01-02 LAB — BASIC METABOLIC PANEL WITH GFR
BUN: 17 mg/dL (ref 6–23)
CO2: 25 meq/L (ref 19–32)
Calcium: 9 mg/dL (ref 8.4–10.5)
Chloride: 102 meq/L (ref 96–112)
Creatinine, Ser: 1.02 mg/dL (ref 0.40–1.50)
GFR: 89.12 mL/min (ref >60.00)
Glucose, Bld: 101 mg/dL — ABNORMAL HIGH (ref 70–99)
Potassium: 4 meq/L (ref 3.5–5.1)
Sodium: 136 meq/L (ref 135–145)

## 2024-01-02 LAB — MICROALBUMIN / CREATININE URINE RATIO
Creatinine,U: 198.9 mg/dL
Microalb Creat Ratio: 4 mg/g (ref 0.0–30.0)
Microalb, Ur: 0.8 mg/dL (ref 0.0–1.9)

## 2024-01-02 LAB — LIPID PANEL
Cholesterol: 130 mg/dL (ref 0–200)
HDL: 51.5 mg/dL (ref >39.00)
LDL Cholesterol: 51 mg/dL (ref 0–99)
NonHDL: 78.23
Total CHOL/HDL Ratio: 3
Triglycerides: 135 mg/dL (ref 0.0–149.0)
VLDL: 27 mg/dL (ref 0.0–40.0)

## 2024-01-02 LAB — HEMOGLOBIN A1C: Hgb A1c MFr Bld: 6.2 % (ref 4.6–6.5)

## 2024-01-02 NOTE — Assessment & Plan Note (Addendum)
 Repeat A1c pending.  Commended him on weight loss!  Remain off insulin  for now. Continue metformin  1000 mg once daily, consider dose reduction if warranted. Continue Mounjaro  15 mg weekly.  Urine microalbumin pending. Discussed to schedule eye exam.  Follow-up in 6 months.

## 2024-01-02 NOTE — Assessment & Plan Note (Signed)
 Controlled.  Continue to monitor.

## 2024-01-02 NOTE — Patient Instructions (Signed)
 Stop by the lab prior to leaving today. I will notify you of your results once received.   Please schedule a follow up visit for 6 months for a diabetes check.  It was a pleasure to see you today!

## 2024-01-02 NOTE — Progress Notes (Signed)
 Subjective:    Patient ID: [name], male    DOB: 1980-08-30, 43 y.o.   MRN: [id number]  HPI  [name] is a very pleasant 43 y.o. male who presents today for complete physical and follow up of chronic conditions.  Immunizations: -Tetanus: Completed in 2020 -Pneumonia: Completed in 2024  Diet: Fair diet.  Exercise: No regular exercise. Active at work.  Eye exam: Completed >1 year ago Dental exam: Completes semi-annually    Colonoscopy: Completed in 2020, due 2023, follows with GI through [facility]. He has yet to hear about scheduling.    BP Readings from Last 3 Encounters:  01/02/24 118/66  06/26/23 116/74  12/31/22 132/68   Wt Readings from Last 3 Encounters:  01/02/24 (!) 305 lb (138.3 kg)  06/26/23 (!) 329 lb (149.2 kg)  12/31/22 (!) 330 lb (149.7 kg)        Review of Systems  Constitutional:  Negative for unexpected weight change.  HENT:  Negative for rhinorrhea.   Respiratory:  Negative for cough and shortness of breath.   Cardiovascular:  Negative for chest pain.  Gastrointestinal:  Negative for constipation and diarrhea.  Genitourinary:  Negative for difficulty urinating.  Musculoskeletal:  Negative for arthralgias and myalgias.  Skin:  Negative for rash.  Allergic/Immunologic: Negative for environmental allergies.  Neurological:  Negative for dizziness and headaches.  Psychiatric/Behavioral:  The patient is not nervous/anxious.          Past Medical History:  Diagnosis Date   Acute breast pain 11/22/2021   Acute foot pain, right 07/31/2021   Acute midline low back pain without sciatica 10/29/2022   Acute neck pain 10/29/2022   Blood in stool    Depression    Elevated blood pressure    Hyperlipidemia    OSA (obstructive sleep apnea) 2014   Pseudomonas infection 07/25/2020   Seasonal allergies    Tinea pedis of both feet 12/02/2022   Ulcerative colitis (HCC) 03/05/2018   Wound infection 07/25/2020    Social History   Socioeconomic  History   Marital status: Married    Spouse name: Not on file   Number of children: Not on file   Years of education: Not on file   Highest education level: Not on file  Occupational History   Not on file  Tobacco Use   Smoking status: Every Day    Current packs/day: 0.00    Average packs/day: 1 pack/day for 14.0 years (14.0 ttl pk-yrs)    Types: Cigarettes    Start date: 11/24/2004    Last attempt to quit: 11/25/2018    Years since quitting: 5.1   Smokeless tobacco: Former    Quit date: 03/17/2015  Vaping Use   Vaping status: Never Used  Substance and Sexual Activity   Alcohol use: Not Currently    Alcohol/week: 0.0 standard drinks of alcohol    Comment: soical   Drug use: Never   Sexual activity: Not on file  Other Topics Concern   Not on file  Social History Narrative   Married.   7 kids.   Works as a Magazine features editor at [employer].   Enjoys target shooting.    Social Drivers of Corporate investment banker Strain: Not on file  Food Insecurity: Not on file  Transportation Needs: Not on file  Physical Activity: Not on file  Stress: Not on file  Social Connections: Not on file  Intimate Partner Violence: Not on file    Past Surgical History:  Procedure Laterality  Date   SEPTOPLASTY  2007   WRIST SURGERY      Family History  Problem Relation Age of Onset   Multiple sclerosis Mother    Dementia Mother    Alcohol abuse Paternal Aunt    Alcohol abuse Paternal Uncle    Stroke Paternal Uncle    Hyperlipidemia Maternal Grandfather    Hypertension Maternal Grandfather    Diabetes Maternal Grandfather    Alcohol abuse Maternal Grandfather    Breast cancer Neg Hx     Allergies  Allergen Reactions   Biaxin [Clarithromycin] Other (See Comments)    Causes colitis flares   Milk-Related Compounds Hives    Current Outpatient Medications on File Prior to Visit  Medication Sig Dispense Refill   acetaminophen (TYLENOL) 500 MG tablet Take 1,000 mg by mouth every 6 (six)  hours as needed for headache (pain).     atorvastatin (LIPITOR) 40 MG tablet TAKE 1 TABLET(40 MG) BY MOUTH DAILY FOR CHOLESTEROL 90 tablet 0   blood glucose meter kit and supplies KIT Dispense based on patient and insurance preference. Use up to four times daily as directed. (FOR ICD-9 250.00, 250.01). 1 each 0   buPROPion ER (WELLBUTRIN SR) 100 MG 12 hr tablet Take 1 tablet (100 mg total) by mouth 2 (two) times daily. For depression 180 tablet 0   clobetasol ointment (TEMOVATE) 0.05 % Apply 1 application  topically 2 (two) times daily.     Continuous Glucose Receiver (DEXCOM G7 RECEIVER) DEVI Use to check blood sugars. 1 each 3   Continuous Glucose Sensor (DEXCOM G7 SENSOR) MISC Apply every 10 days to check blood sugars. 9 each 3   hydrocortisone 2.5 % ointment Apply topically.     ibuprofen (ADVIL,MOTRIN) 200 MG tablet Take 400-600 mg by mouth every 6 (six) hours as needed for headache (pain).     inFLIXimab (REMICADE) 100 MG injection Inject into the muscle once a week.     Insulin Pen Needle (B-D UF III MINI PEN NEEDLES) 31G X 5 MM MISC USE 1 EVERY NIGHT AT BEDTIME WITH INSULIN AS DIRECTED. 30 each 0   ketoconazole (NIZORAL) 2 % cream Apply topically 2 (two) times daily.     metFORMIN (GLUCOPHAGE) 1000 MG tablet Take 1 tablet (1,000 mg total) by mouth 2 (two) times daily with a meal. for diabetes. 180 tablet 1   terbinafine (LAMISIL) 250 MG tablet Take 1 tablet (250 mg total) by mouth daily. For athletes foot 30 tablet 0   tirzepatide (MOUNJARO) 15 MG/0.5ML Pen Inject 15 mg into the skin once a week. for diabetes. 6 mL 0   triamcinolone ointment (KENALOG) 0.1 % Apply topically.     venlafaxine XR (EFFEXOR-XR) 150 MG 24 hr capsule TAKE 1 CAPSULE(150 MG) BY MOUTH DAILY WITH BREAKFAST FOR ANXIETY OR DEPRESSION 90 capsule 0   No current facility-administered medications on file prior to visit.    BP 118/66   Pulse 85   Temp 98 F (36.7 C) (Temporal)   Ht 5' 9 (1.753 m)   Wt (!) 305 lb  (138.3 kg)   SpO2 95%   BMI 45.04 kg/m  Objective:   Physical Exam HENT:     Right Ear: Tympanic membrane and ear canal normal.     Left Ear: Tympanic membrane and ear canal normal.  Eyes:     Pupils: Pupils are equal, round, and reactive to light.  Cardiovascular:     Rate and Rhythm: Normal rate and regular rhythm.  Pulmonary:  Effort: Pulmonary effort is normal.     Breath sounds: Normal breath sounds.  Abdominal:     General: Bowel sounds are normal.     Palpations: Abdomen is soft.     Tenderness: There is no abdominal tenderness.  Musculoskeletal:        General: Normal range of motion.     Cervical back: Neck supple.  Skin:    General: Skin is warm and dry.  Neurological:     Mental Status: He is alert and oriented to person, place, and time.     Cranial Nerves: No cranial nerve deficit.     Deep Tendon Reflexes:     Reflex Scores:      Patellar reflexes are 2+ on the right side and 2+ on the left side. Psychiatric:        Mood and Affect: Mood normal.           Assessment & Plan:  Preventative health care Assessment & Plan: Immunizations UTD. Colonoscopy overdue, discussed to schedule with [facility]  Discussed the importance of a healthy diet and regular exercise in order for weight loss, and to reduce the risk of further co-morbidity.  Exam stable. Labs pending.  Follow up in 1 year for repeat physical.    Essential hypertension Assessment & Plan: Controlled.  Continue to monitor.   Orders: -     Basic metabolic panel with GFR  Ulcerative colitis without complications, unspecified location [facility]) Assessment & Plan: Stable.  Following GI, office notes reviewed from April 2025 through Care Everywhere. We discussed that he is overdue for colonoscopy and to schedule with [facility] as recommended.   Type 2 diabetes mellitus with hyperglycemia, with long-term current use of insulin  (HCC) Assessment & Plan: Repeat A1c pending.  Commended him on  weight loss!  Remain off insulin  for now. Continue metformin  1000 mg once daily, consider dose reduction if warranted. Continue Mounjaro  15 mg weekly.  Urine microalbumin pending. Discussed to schedule eye exam.  Follow-up in 6 months.  Orders: -     Hemoglobin A1c -     Microalbumin / creatinine urine ratio  Hyperlipidemia, unspecified hyperlipidemia type Assessment & Plan: Repeat lipid panel pending.  Continue atorvastatin  40 mg daily.  Orders: -     Lipid panel  Pyoderma gangrenosum Assessment & Plan: Following with [facility] dermatology, office notes reviewed from February 2025 through Care Everywhere. Continue Remicade every 6 weeks.   Recurrent major depressive disorder, in full remission [facility]) Assessment & Plan: Controlled.  Continue bupropion  SR 100 mg twice daily, venlafaxine  ER 150 mg daily.         Eastin Swing K Serrita Lueth, NP

## 2024-01-02 NOTE — Assessment & Plan Note (Signed)
 Repeat lipid panel pending. Continue atorvastatin 40 mg daily.

## 2024-01-02 NOTE — Assessment & Plan Note (Signed)
 Controlled.  Continue bupropion  SR 100 mg twice daily, venlafaxine  ER 150 mg daily.

## 2024-01-02 NOTE — Assessment & Plan Note (Signed)
 Stable.  Following GI, office notes reviewed from April 2025 through Care Everywhere. We discussed that he is overdue for colonoscopy and to schedule with [facility] as recommended.

## 2024-01-02 NOTE — Assessment & Plan Note (Signed)
 Immunizations UTD. Colonoscopy overdue, discussed to schedule with [facility]  Discussed the importance of a healthy diet and regular exercise in order for weight loss, and to reduce the risk of further co-morbidity.  Exam stable. Labs pending.  Follow up in 1 year for repeat physical.

## 2024-01-02 NOTE — Assessment & Plan Note (Signed)
 Following with [facility] dermatology, office notes reviewed from February 2025 through Care Everywhere. Continue Remicade every 6 weeks.

## 2024-01-08 NOTE — Unmapped (Signed)
 Received results from [facility] it has been uploaded to [employer] message was sent to Dr. Alpha .[email or web address]

## 2024-02-19 NOTE — Unmapped (Signed)
 [facility] Report in Careers information officer for review.

## 2024-02-20 NOTE — Unmapped (Signed)
 [facility] Report in Careers information officer for review

## 2024-02-24 ENCOUNTER — Other Ambulatory Visit: Payer: Self-pay

## 2024-02-24 DIAGNOSIS — E1165 Type 2 diabetes mellitus with hyperglycemia: Secondary | ICD-10-CM

## 2024-02-24 MED ORDER — TIRZEPATIDE 15 MG/0.5ML ~~LOC~~ SOAJ: 15.0000 mg | SUBCUTANEOUS | 1 refills | Status: AC

## 2024-03-12 ENCOUNTER — Other Ambulatory Visit: Payer: Self-pay

## 2024-03-12 DIAGNOSIS — Z720 Tobacco use: Secondary | ICD-10-CM

## 2024-03-14 MED ORDER — BUPROPION HCL ER (SR) 100 MG PO TB12: 100.0000 mg | ORAL_TABLET | Freq: Two times a day (BID) | ORAL | 2 refills | Status: AC

## 2024-03-23 ENCOUNTER — Other Ambulatory Visit: Payer: Self-pay | Admitting: *Deleted

## 2024-03-23 DIAGNOSIS — F3342 Major depressive disorder, recurrent, in full remission: Secondary | ICD-10-CM

## 2024-03-24 MED ORDER — VENLAFAXINE HCL ER 150 MG PO CP24: 150.0000 mg | ORAL_CAPSULE | Freq: Every day | ORAL | 2 refills | Status: AC

## 2024-03-31 ENCOUNTER — Other Ambulatory Visit: Payer: Self-pay

## 2024-03-31 DIAGNOSIS — E785 Hyperlipidemia, unspecified: Secondary | ICD-10-CM

## 2024-03-31 MED ORDER — ATORVASTATIN CALCIUM 40 MG PO TABS: 40.0000 mg | ORAL_TABLET | Freq: Every day | ORAL | 0 refills | Status: AC

## 2024-03-31 NOTE — Telephone Encounter (Signed)
 Completed.

## 2024-05-25 NOTE — Telephone Encounter (Signed)
 [facility] Report in Careers information officer for review

## 2024-07-06 ENCOUNTER — Ambulatory Visit: Admitting: Primary Care
